# Patient Record
Sex: Male | Born: 1950
Health system: Southern US, Community
[De-identification: ages and names within clinical notes are randomized; demographics above are authoritative.]

## PROBLEM LIST (undated history)

## (undated) VITALS — BP 128/71 | HR 81 | Temp 97.0°F | Resp 16 | Ht 64.0 in | Wt 160.0 lb

## (undated) DIAGNOSIS — M109 Gout, unspecified: Secondary | ICD-10-CM

## (undated) DIAGNOSIS — N184 Chronic kidney disease, stage 4 (severe): Secondary | ICD-10-CM

## (undated) DIAGNOSIS — M94269 Chondromalacia, unspecified knee: Secondary | ICD-10-CM

## (undated) DIAGNOSIS — F1490 Cocaine use, unspecified, uncomplicated: Secondary | ICD-10-CM

## (undated) DIAGNOSIS — D649 Anemia, unspecified: Secondary | ICD-10-CM

## (undated) DIAGNOSIS — I2510 Atherosclerotic heart disease of native coronary artery without angina pectoris: Secondary | ICD-10-CM

## (undated) DIAGNOSIS — E785 Hyperlipidemia, unspecified: Secondary | ICD-10-CM

## (undated) DIAGNOSIS — F329 Major depressive disorder, single episode, unspecified: Secondary | ICD-10-CM

## (undated) DIAGNOSIS — B20 Human immunodeficiency virus [HIV] disease: Secondary | ICD-10-CM

## (undated) DIAGNOSIS — R51 Headache: Secondary | ICD-10-CM

## (undated) DIAGNOSIS — L0390 Cellulitis, unspecified: Secondary | ICD-10-CM

## (undated) DIAGNOSIS — M359 Systemic involvement of connective tissue, unspecified: Secondary | ICD-10-CM

## (undated) DIAGNOSIS — I10 Essential (primary) hypertension: Secondary | ICD-10-CM

## (undated) DIAGNOSIS — I4891 Unspecified atrial fibrillation: Secondary | ICD-10-CM

## (undated) DIAGNOSIS — J45909 Unspecified asthma, uncomplicated: Secondary | ICD-10-CM

## (undated) DIAGNOSIS — F32A Depression, unspecified: Secondary | ICD-10-CM

## (undated) DIAGNOSIS — I2699 Other pulmonary embolism without acute cor pulmonale: Secondary | ICD-10-CM

## (undated) HISTORY — DX: Depression, unspecified: F32.A

## (undated) HISTORY — DX: Hyperlipidemia, unspecified: E78.5

## (undated) HISTORY — DX: Unspecified asthma, uncomplicated: J45.909

## (undated) HISTORY — PX: BACK SURGERY: SHX140

## (undated) HISTORY — PX: SPINE SURGERY: SHX786

## (undated) HISTORY — DX: Atherosclerotic heart disease of native coronary artery without angina pectoris: I25.10

## (undated) HISTORY — PX: CARDIAC SURGERY: SHX584

## (undated) HISTORY — DX: Gout, unspecified: M10.9

## (undated) HISTORY — DX: Major depressive disorder, single episode, unspecified: F32.9

## (undated) HISTORY — DX: Chondromalacia, unspecified knee: M94.269

## (undated) HISTORY — PX: CORONARY ARTERY BYPASS GRAFT: SHX141

## (undated) HISTORY — PX: CERVICAL SPINE SURGERY: SHX589

## (undated) HISTORY — PX: CORONARY STENT PLACEMENT: SHX1402

## (undated) HISTORY — DX: Human immunodeficiency virus (HIV) disease: B20

## (undated) HISTORY — PX: BOWEL RESECTION: SHX1257

---

## 2000-10-17 DIAGNOSIS — E1140 Type 2 diabetes mellitus with diabetic neuropathy, unspecified: Secondary | ICD-10-CM | POA: Insufficient documentation

## 2000-10-17 DIAGNOSIS — IMO0002 Reserved for concepts with insufficient information to code with codable children: Secondary | ICD-10-CM | POA: Insufficient documentation

## 2008-11-28 ENCOUNTER — Emergency Department (HOSPITAL_COMMUNITY): Admission: EM | Admit: 2008-11-28 | Discharge: 2008-11-28 | Payer: Self-pay | Admitting: Emergency Medicine

## 2008-12-20 ENCOUNTER — Emergency Department (HOSPITAL_COMMUNITY): Admission: EM | Admit: 2008-12-20 | Discharge: 2008-12-20 | Payer: Self-pay | Admitting: Emergency Medicine

## 2009-01-13 ENCOUNTER — Emergency Department (HOSPITAL_COMMUNITY): Admission: EM | Admit: 2009-01-13 | Discharge: 2009-01-13 | Payer: Self-pay | Admitting: Emergency Medicine

## 2009-03-19 ENCOUNTER — Emergency Department (HOSPITAL_COMMUNITY): Admission: EM | Admit: 2009-03-19 | Discharge: 2009-03-19 | Payer: Self-pay | Admitting: Emergency Medicine

## 2009-06-11 ENCOUNTER — Emergency Department (HOSPITAL_COMMUNITY): Admission: EM | Admit: 2009-06-11 | Discharge: 2009-06-11 | Payer: Self-pay | Admitting: Emergency Medicine

## 2009-06-28 ENCOUNTER — Ambulatory Visit: Payer: Self-pay | Admitting: Internal Medicine

## 2009-06-28 ENCOUNTER — Inpatient Hospital Stay (HOSPITAL_COMMUNITY): Admission: EM | Admit: 2009-06-28 | Discharge: 2009-07-02 | Payer: Self-pay | Admitting: Emergency Medicine

## 2009-06-28 ENCOUNTER — Emergency Department (HOSPITAL_COMMUNITY): Admission: EM | Admit: 2009-06-28 | Discharge: 2009-06-28 | Payer: Self-pay | Admitting: Family Medicine

## 2009-06-30 ENCOUNTER — Encounter (INDEPENDENT_AMBULATORY_CARE_PROVIDER_SITE_OTHER): Payer: Self-pay | Admitting: Internal Medicine

## 2009-08-07 DEATH — deceased

## 2010-02-22 IMAGING — CR DG CHEST 2V
2 series · 2 of 2 positions shown · non-contrast
Comparison: None available.

CLINICAL DATA: Head cold.  Cough.

CHEST - 2 VIEW

[view not recorded (1 of 2)]
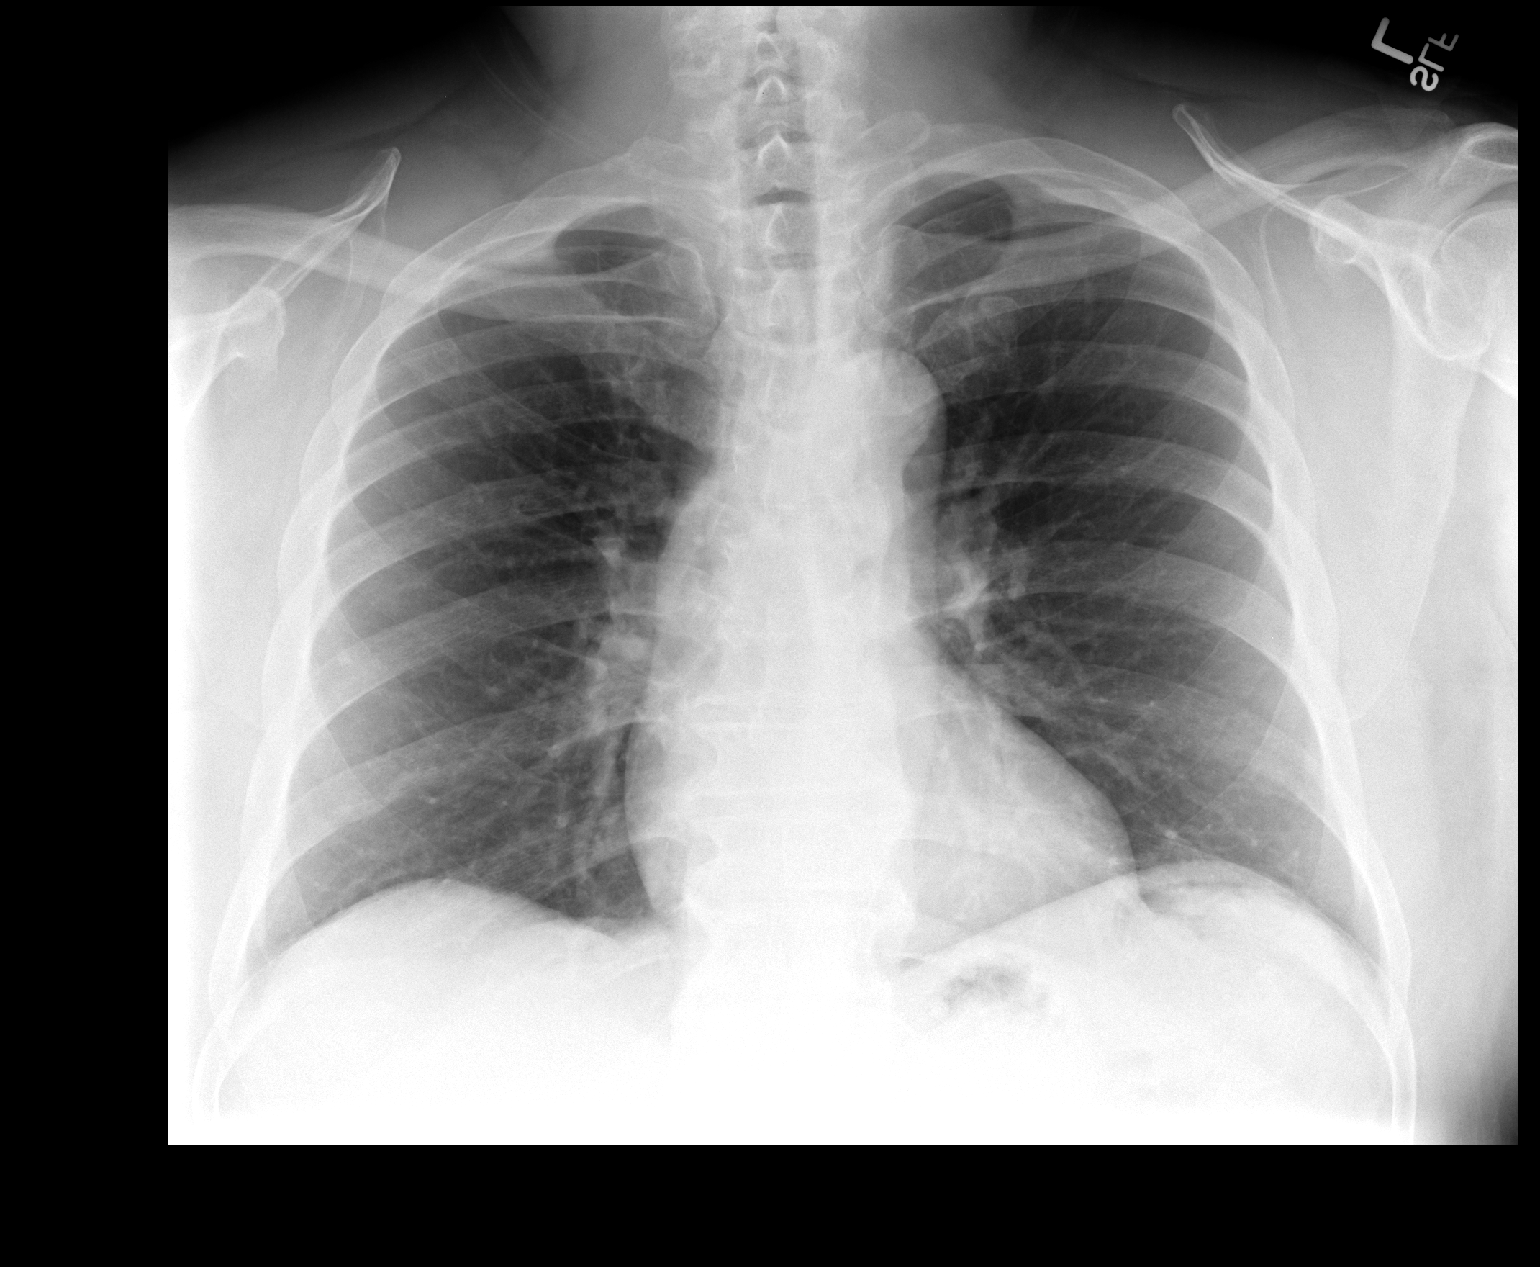

[view not recorded (2 of 2)]
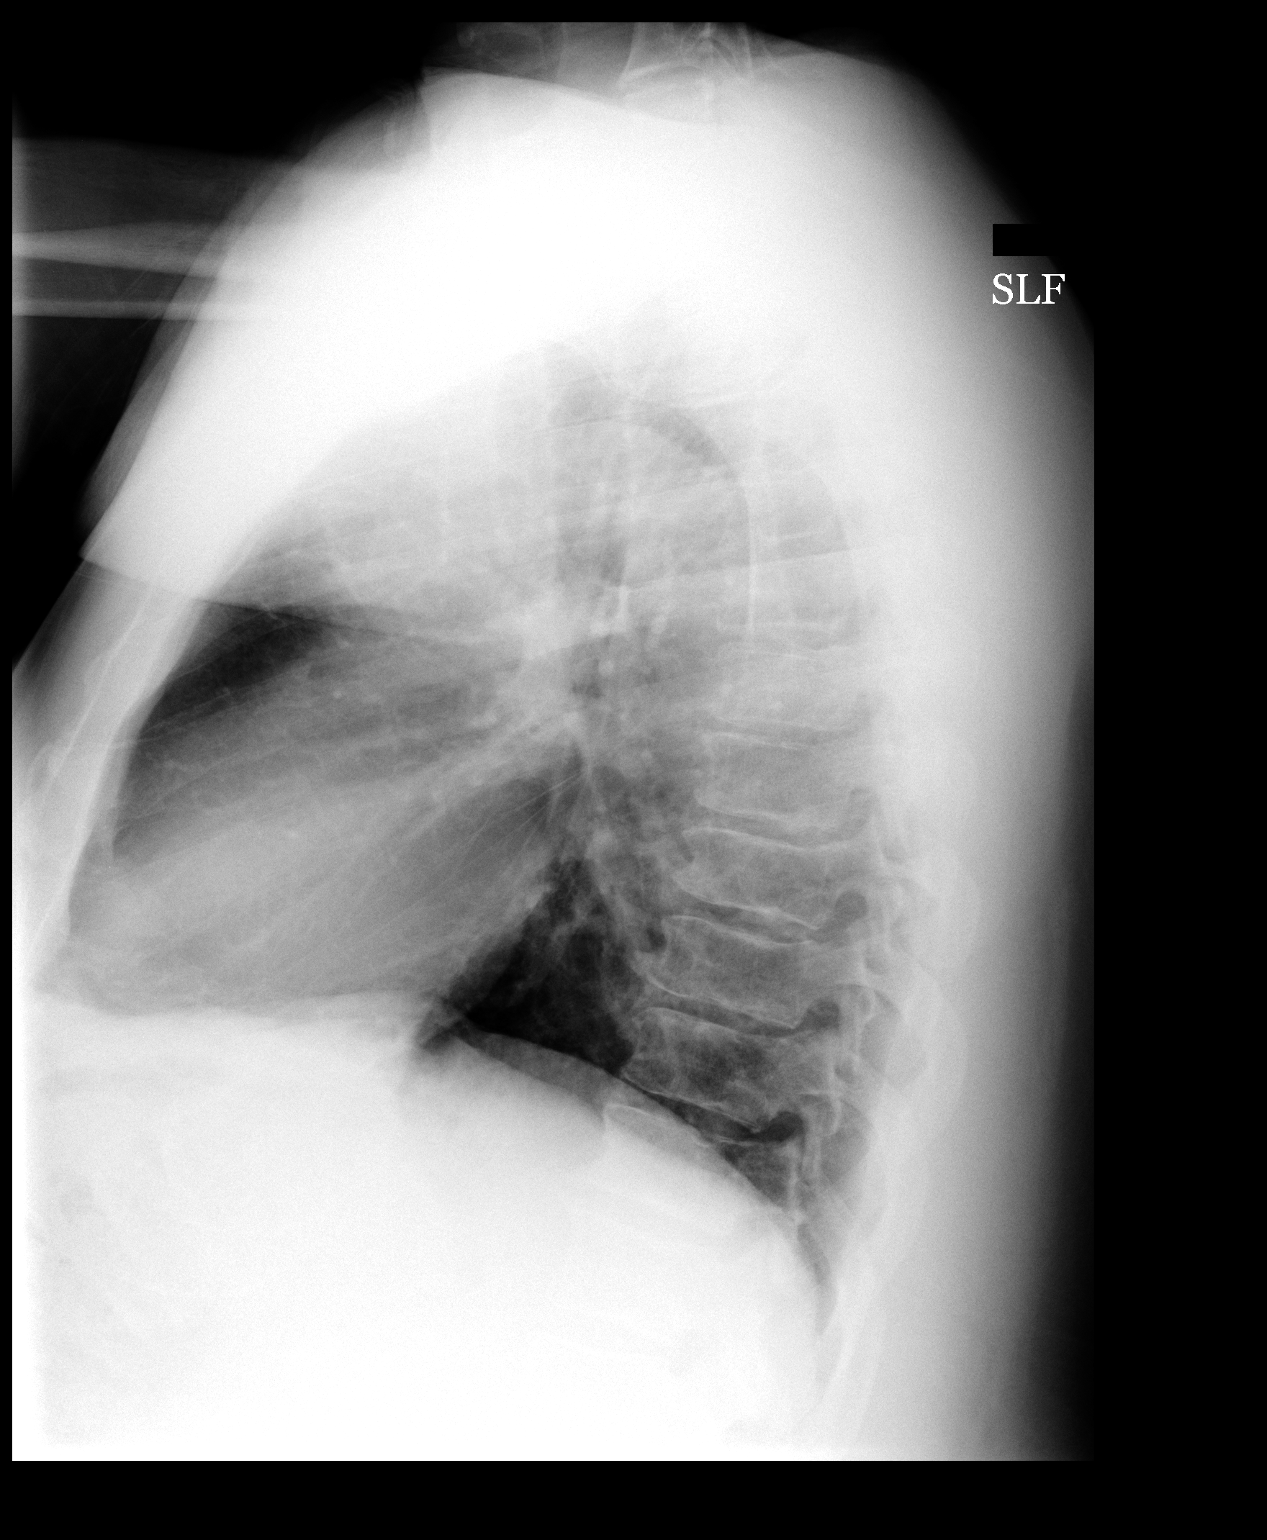

[2 of 2 positions shown; findings below may reference images not displayed]

FINDINGS: The cardiopericardial silhouette appears within normal
limits.  The left lung appears clear.  There is a 4 mm pulmonary
nodule present projected over the anterior right fifth rib.  This
is not definitively identified on the lateral view.  Follow-up
chest CT recommended to further assess.  This can be performed on a
non emergent basis.
IMPRESSION: 1.  4 mm right lung pulmonary nodule, follow-up chest CT
recommended.
2.  No acute cardiopulmonary disease.

## 2010-07-21 IMAGING — CR DG ABDOMEN 2V
2 series · 2 of 2 positions shown · non-contrast
Comparison: None

CLINICAL DATA: GI bleed

ABDOMEN - 2 VIEW

[w abdomen upright]
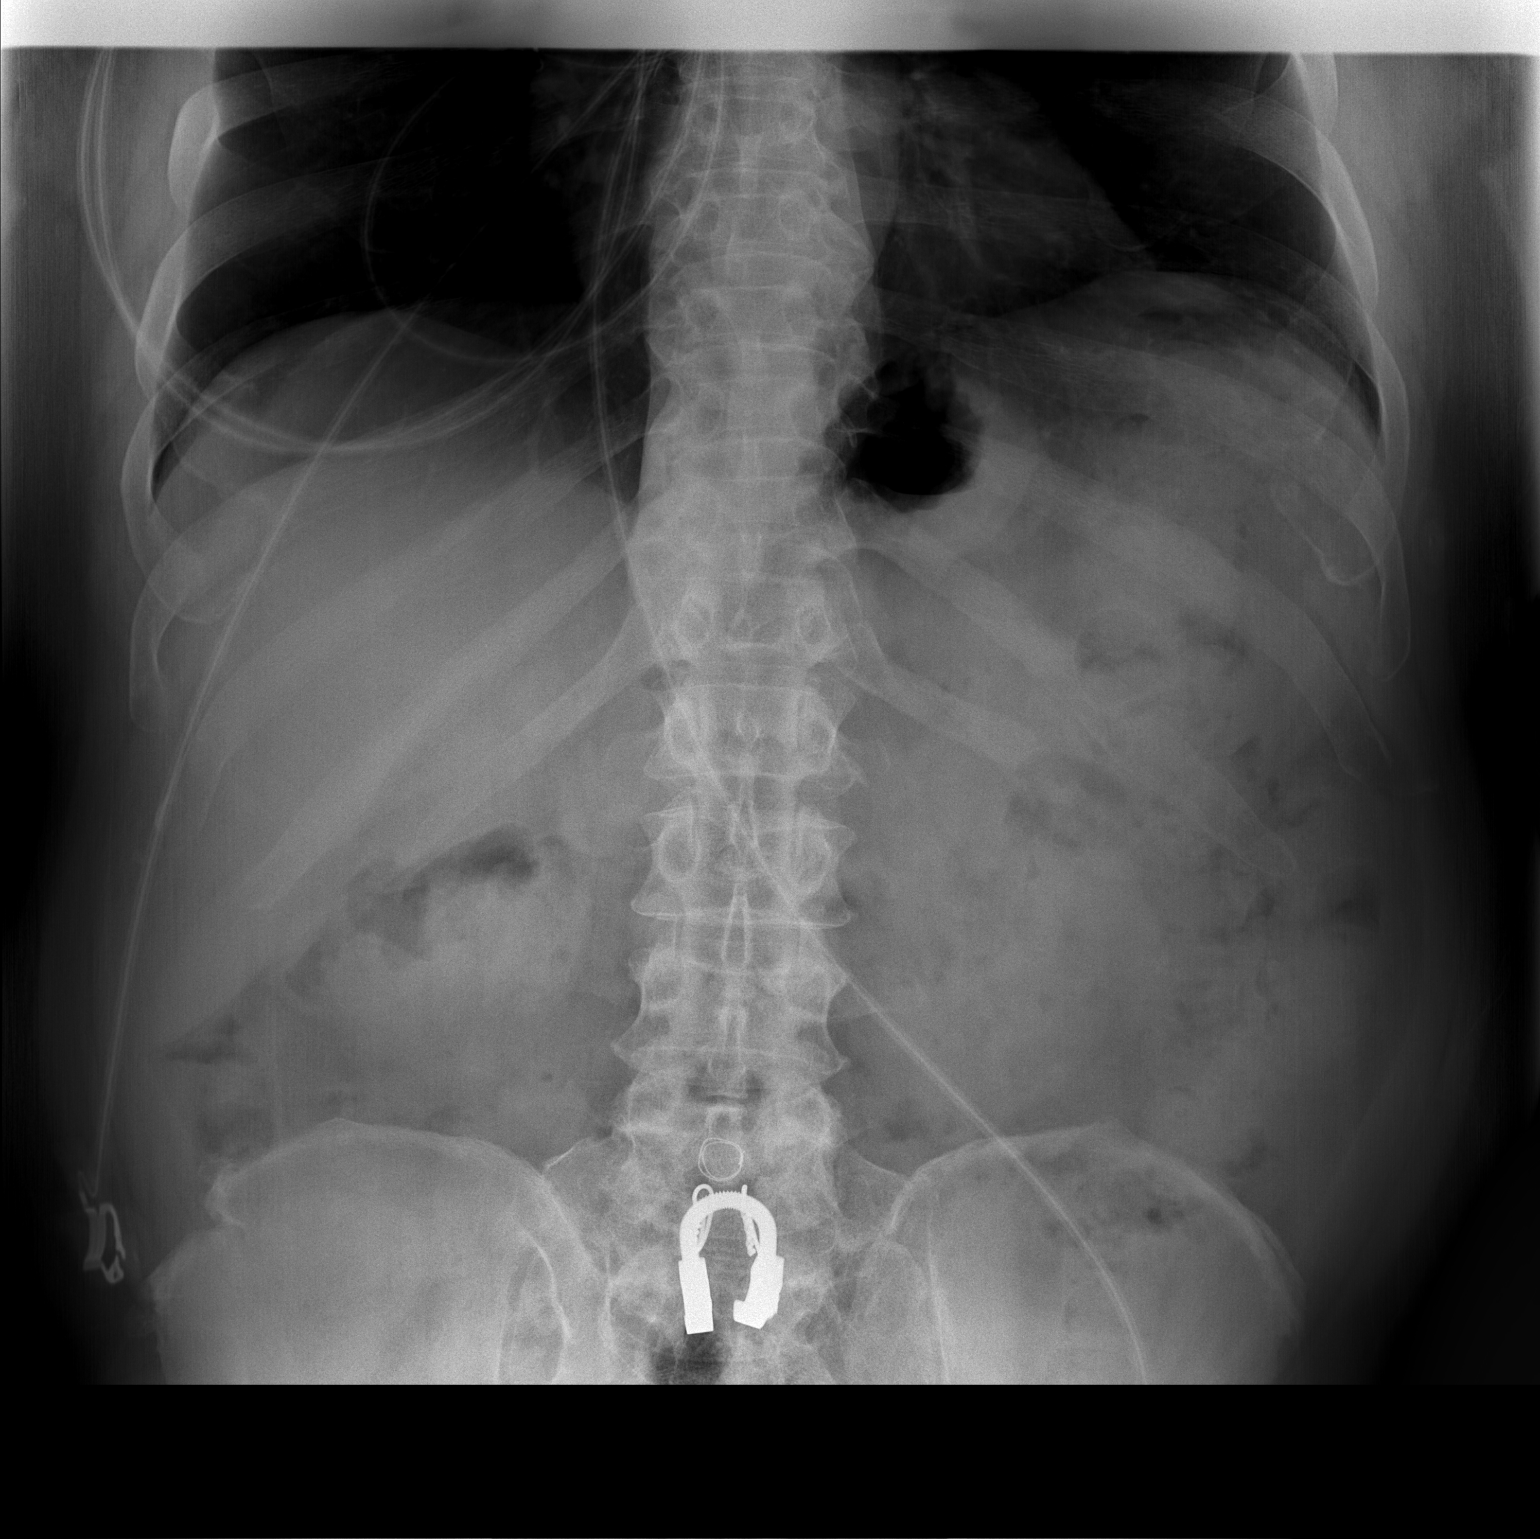

[t abdomen supine]
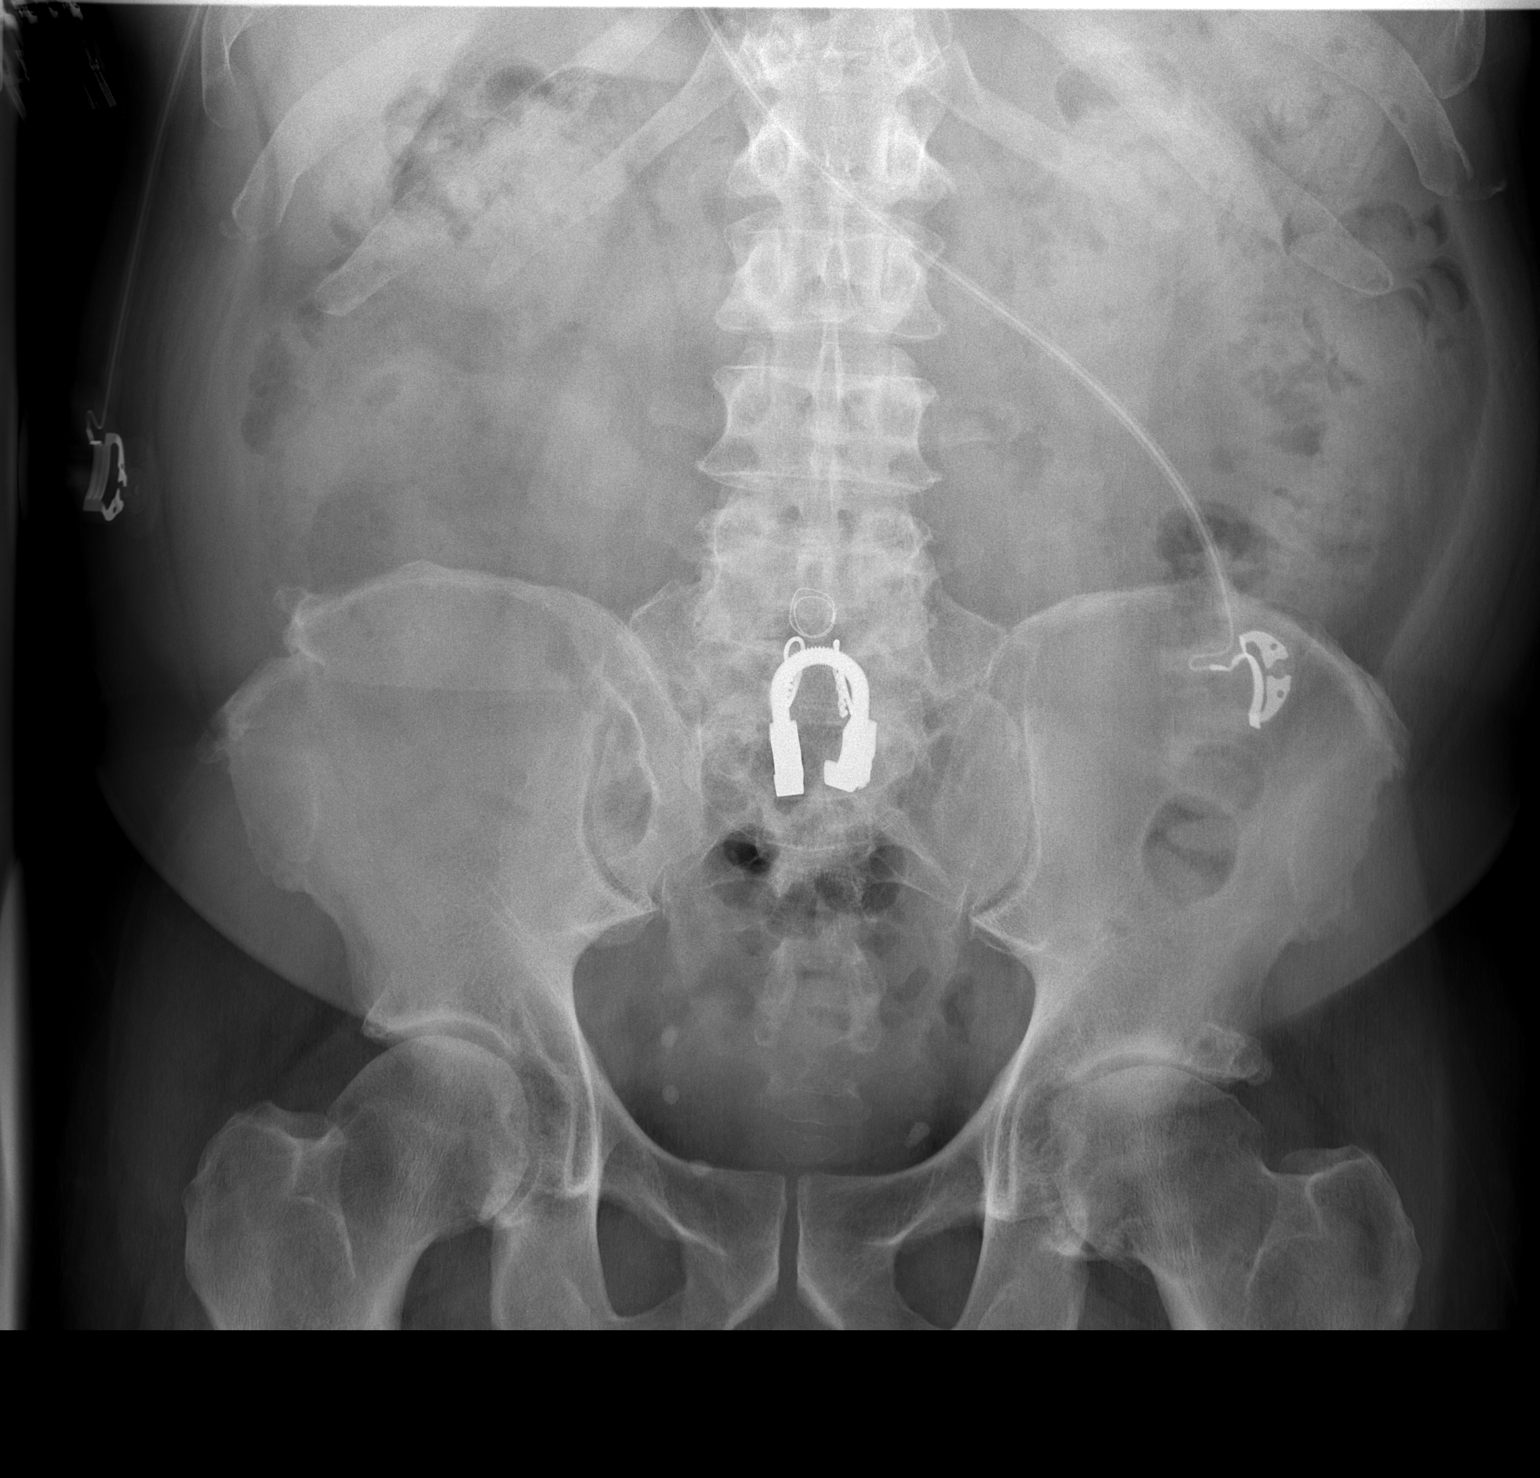

[2 of 2 positions shown; findings below may reference images not displayed]

FINDINGS: There is a nonspecific nonobstructive bowel gas pattern.
Metallic probable postsurgical material noted midline upper pelvis
sacral region.  No free abdominal air.
IMPRESSION: Nonspecific nonobstructive bowel gas pattern.  No free abdominal
air.

## 2010-08-07 IMAGING — CR DG CHEST 2V
2 series · 2 of 2 positions shown · non-contrast
Comparison: 01/13/2009

CLINICAL DATA: Shortness of breath

CHEST - 2 VIEW

[view not recorded (1 of 2)]
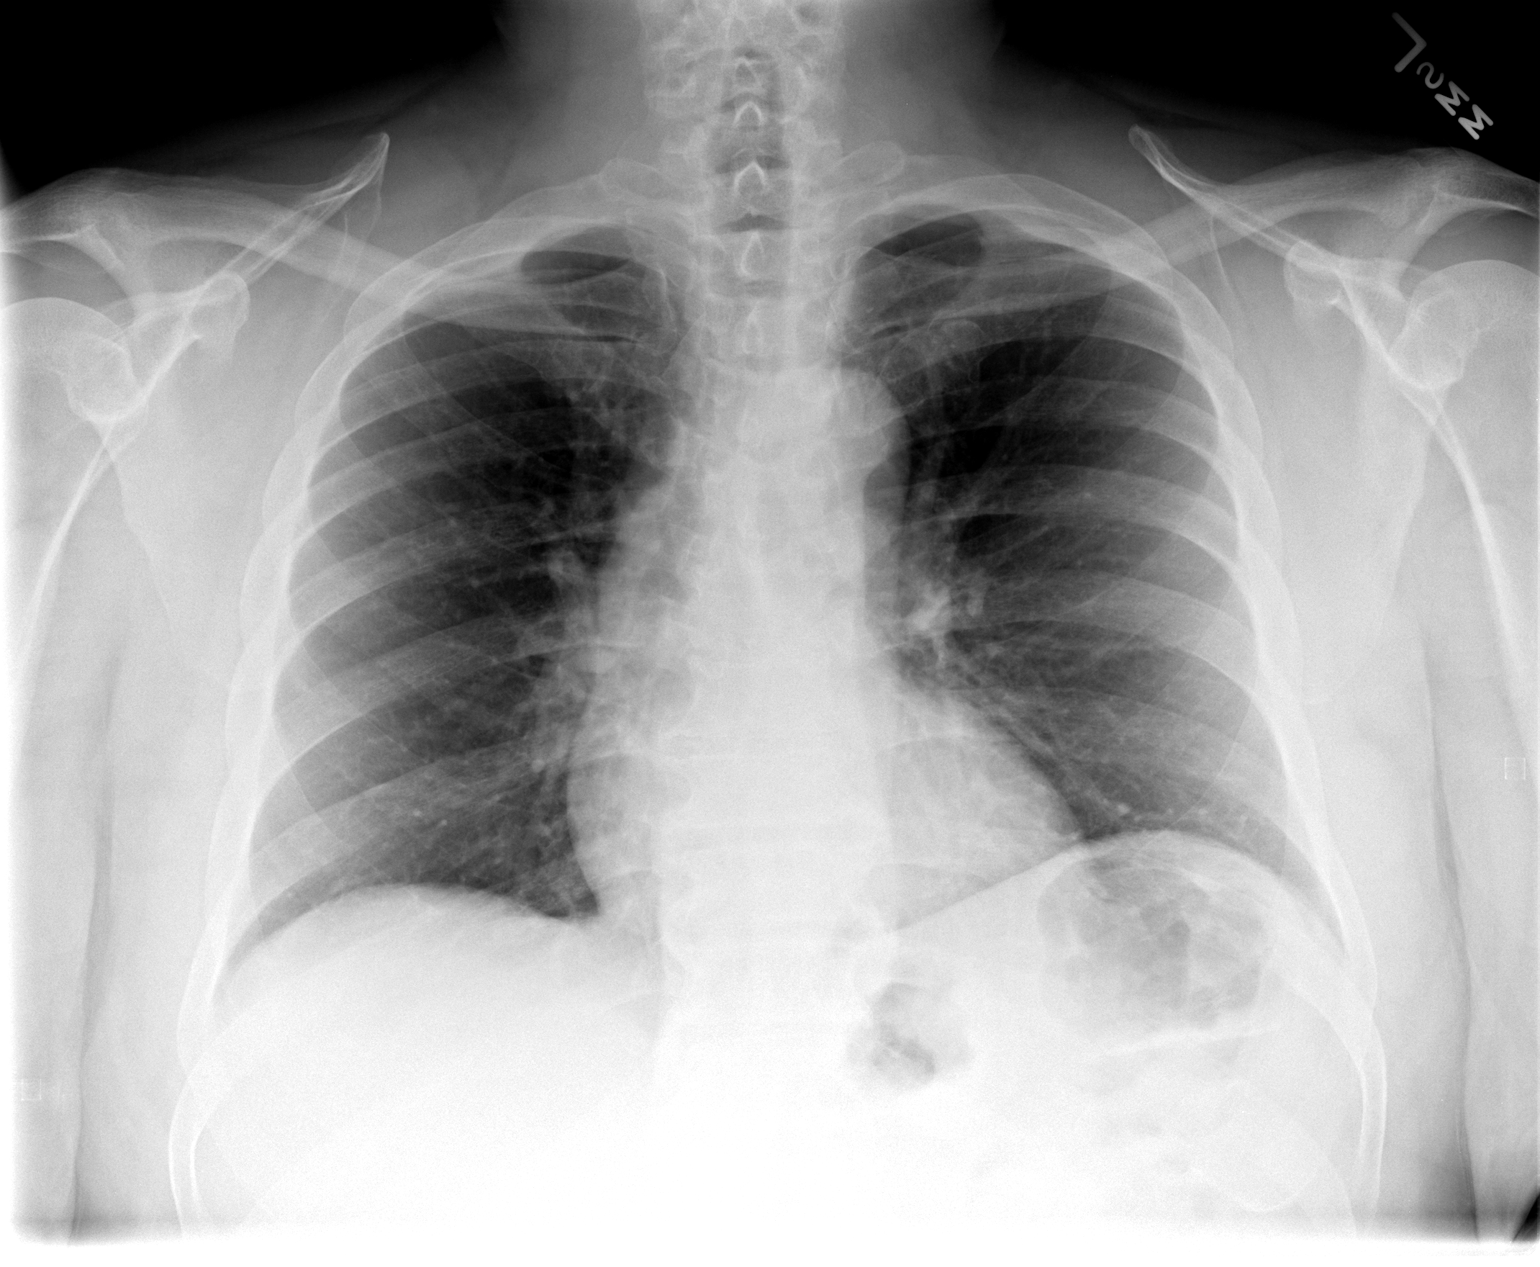

[view not recorded (2 of 2)]
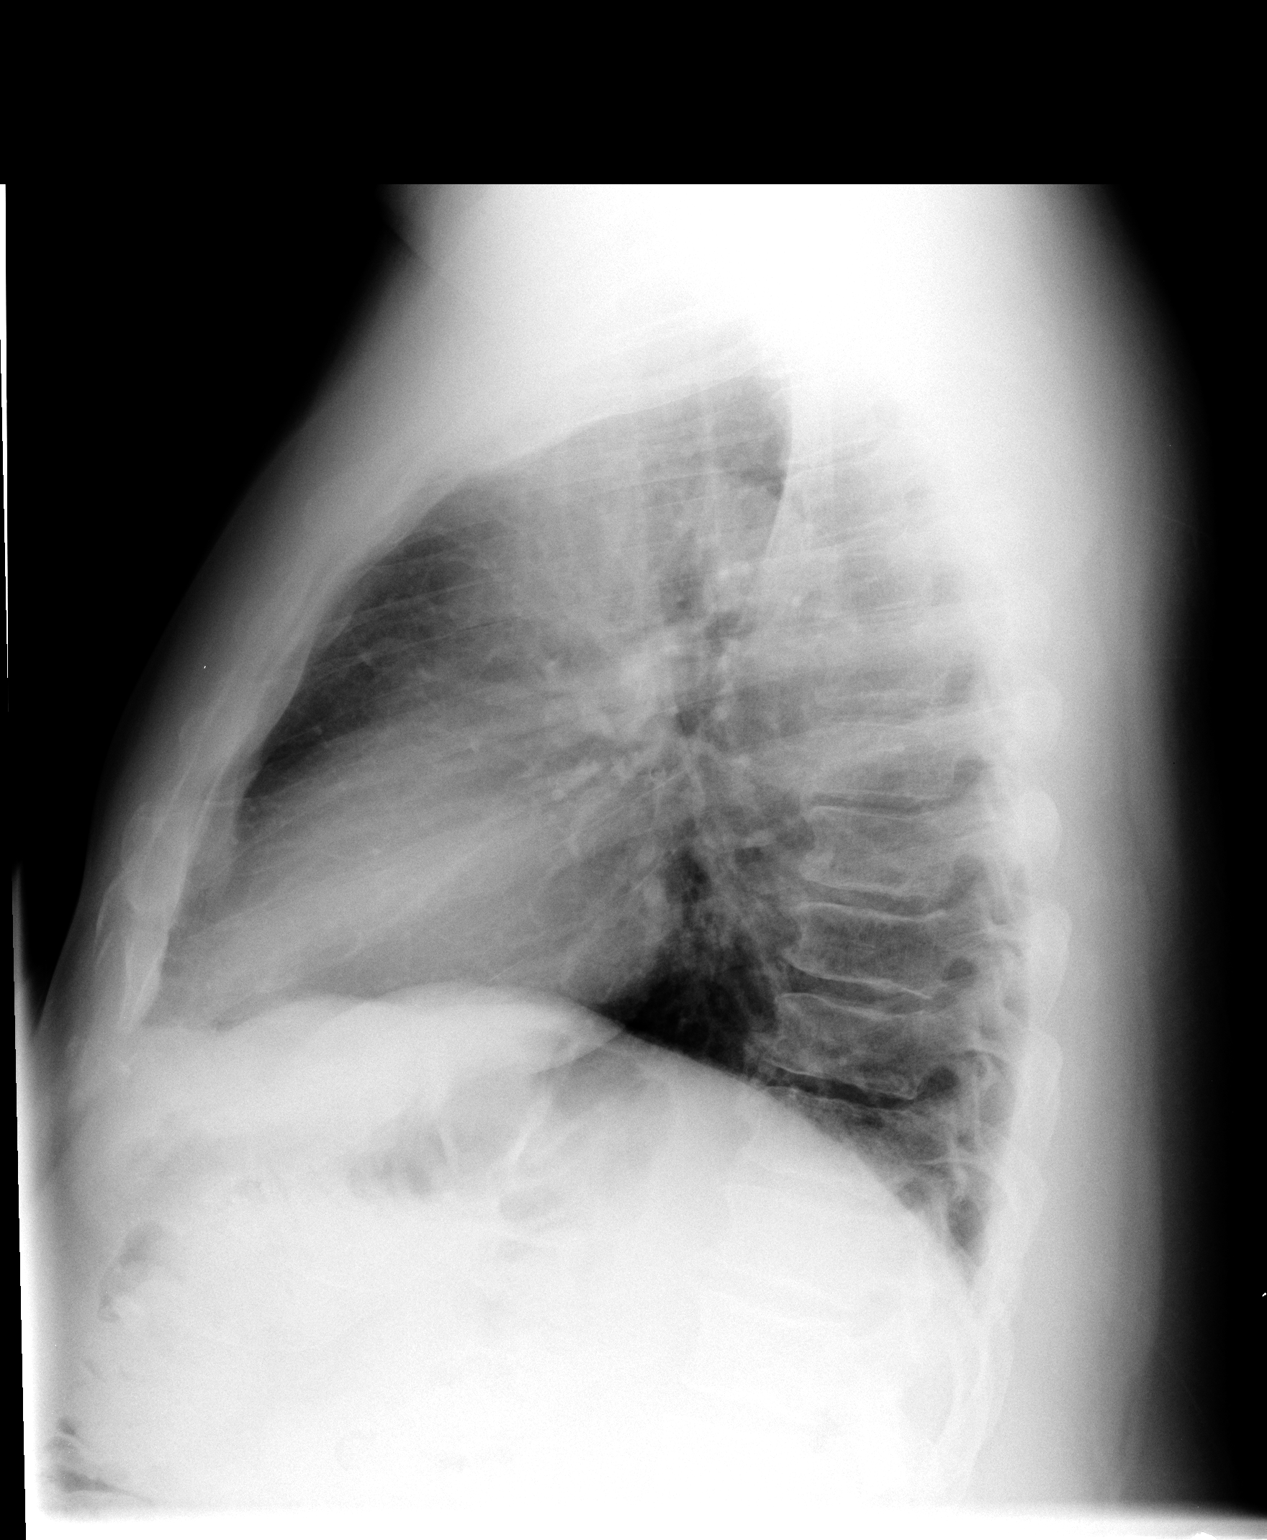

[2 of 2 positions shown; findings below may reference images not displayed]

FINDINGS: The heart size and mediastinal contours are within
normal limits.  Both lungs are clear.  The visualized skeletal
structures are unremarkable. 4 mm right mid lung zone nodule is
stable.
IMPRESSION: No active cardiopulmonary disease. 4 mm right mid lung zone nodule
is stable but follow-up is recommended in 6 months.

## 2010-08-08 IMAGING — CT CT ANGIO CHEST
2 of 6 series · 19 of 36 positions shown · IV contrast (APPLIED)
Comparison: None.

CLINICAL DATA: Shortness of breath. Chest pain.

CT ANGIOGRAPHY CHEST WITH CONTRAST
TECHNIQUE: Multidetector CT imaging of the chest was performed
using the standard protocol during bolus administration of
intravenous contrast. Multiplanar CT image reconstructions
including MIPs were obtained to evaluate the vascular anatomy.
Contrast: 100 ml [id number]-YII.

[Series 8: pulm embolism 1.0 b25f thins · axial · 0.66mm/px · z∈[-255,-14]mm · 18 of 269 slices shown]
[im 14/269  lung]
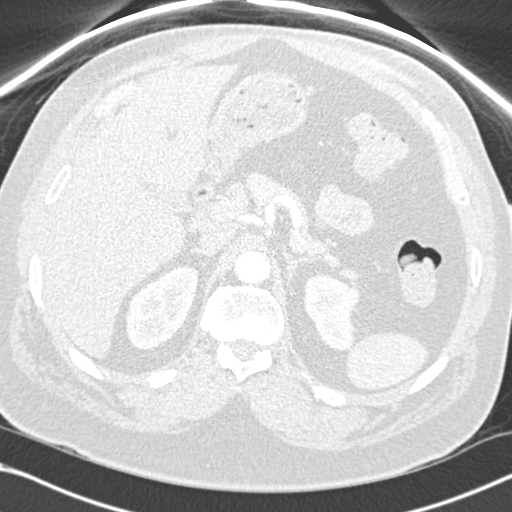
[im 27/269  mediastinal]
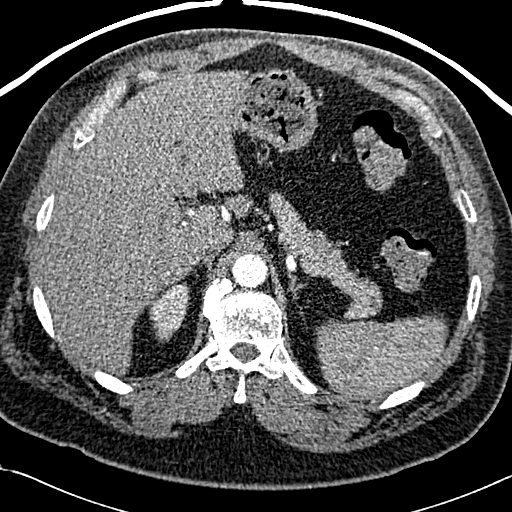
[im 41/269  lung]
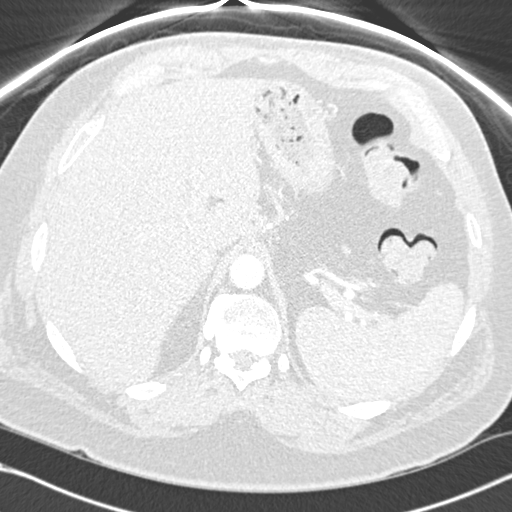
[im 54/269  mediastinal]
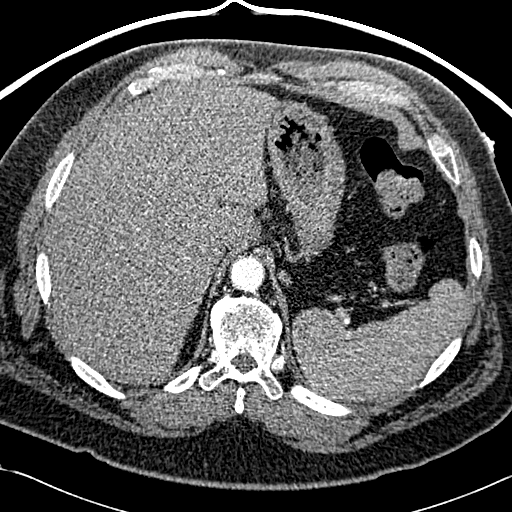
[im 68/269  lung]
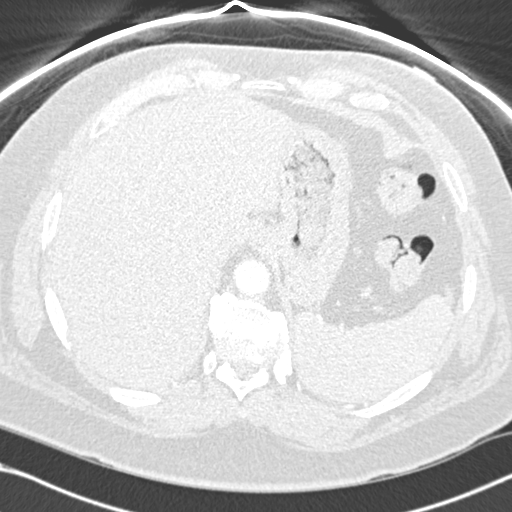
[im 81/269  mediastinal]
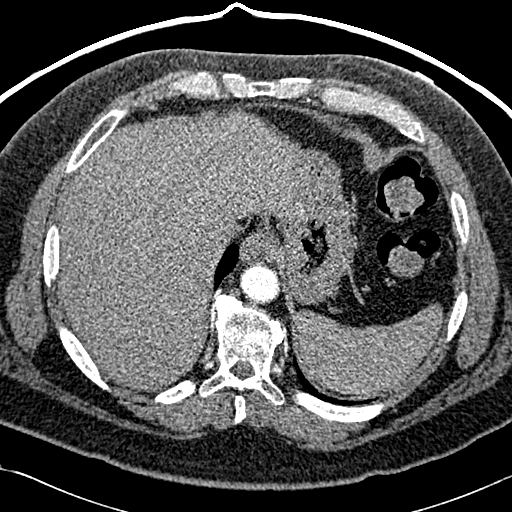
[im 94/269  lung]
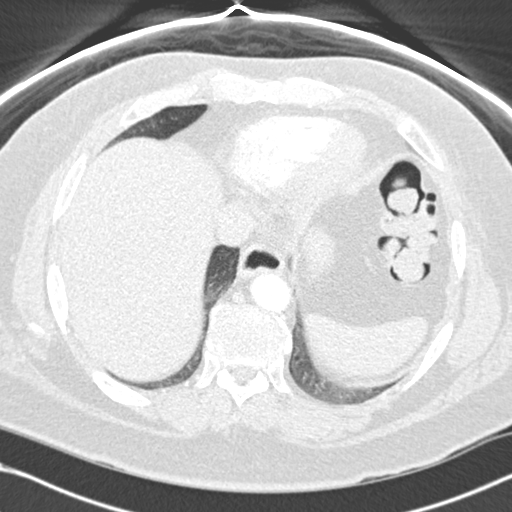
[im 108/269  mediastinal]
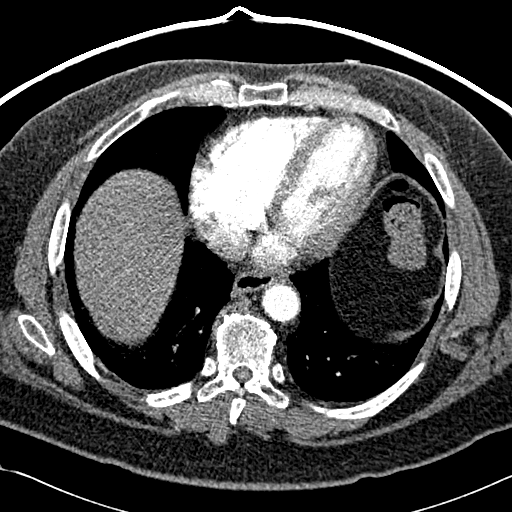
[im 121/269  lung]
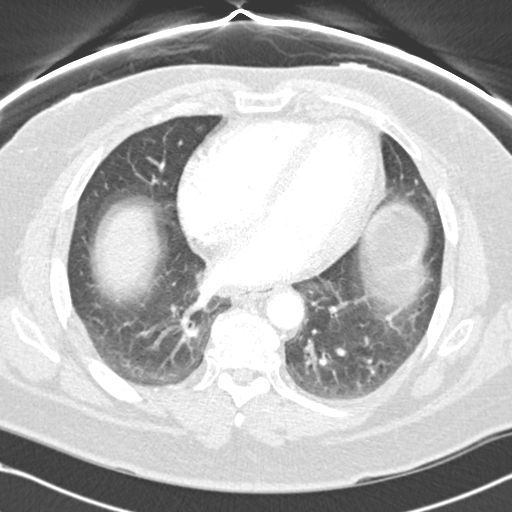
[im 148/269  mediastinal]
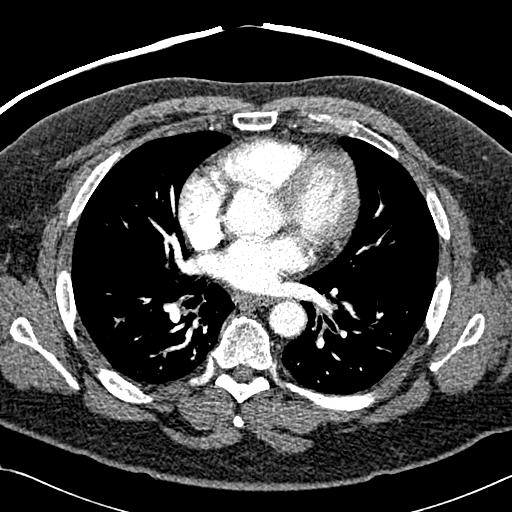
[im 161/269  lung]
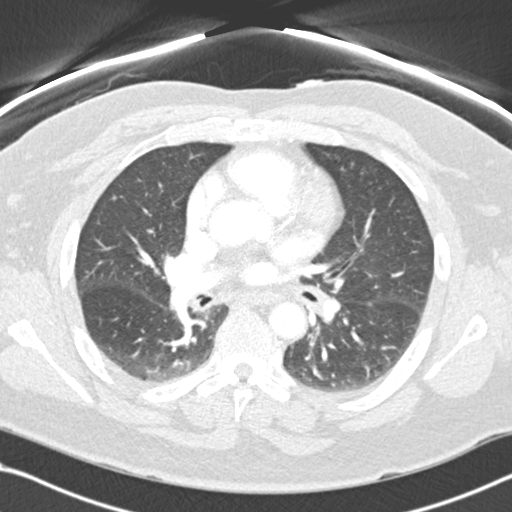
[im 175/269  mediastinal]
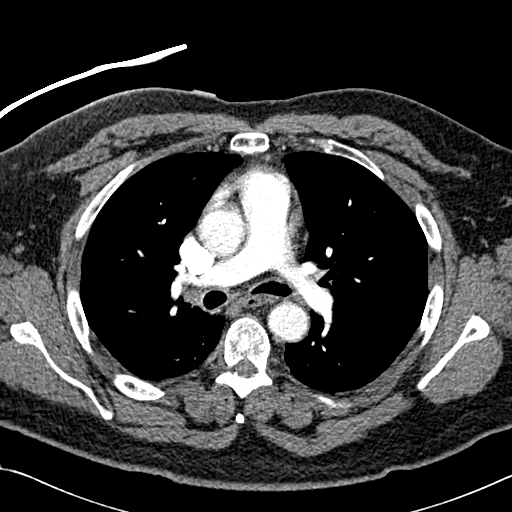
[im 188/269  lung]
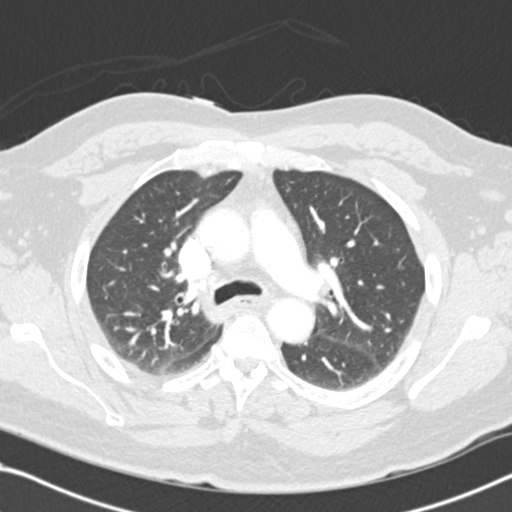
[im 202/269  mediastinal]
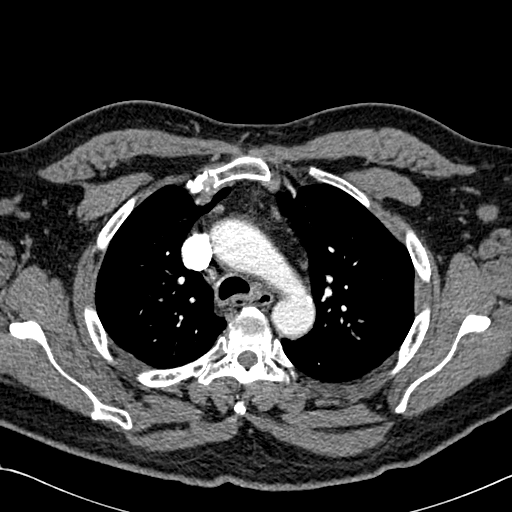
[im 215/269  lung]
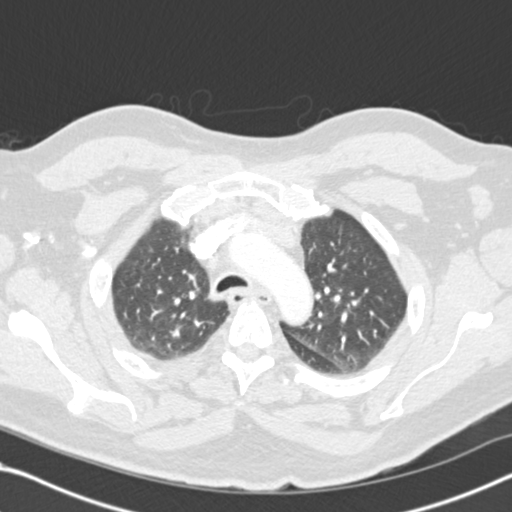
[im 228/269  mediastinal]
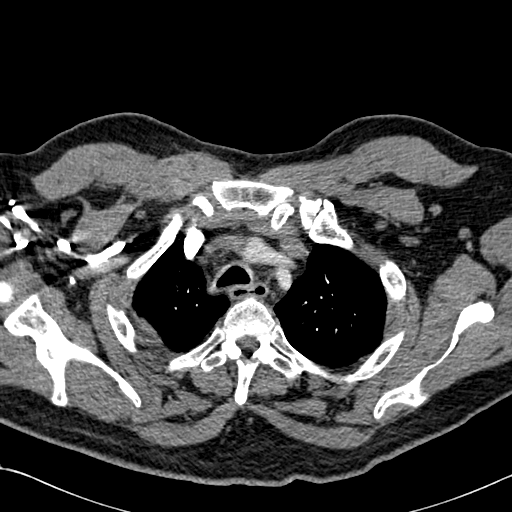
[im 242/269  lung]
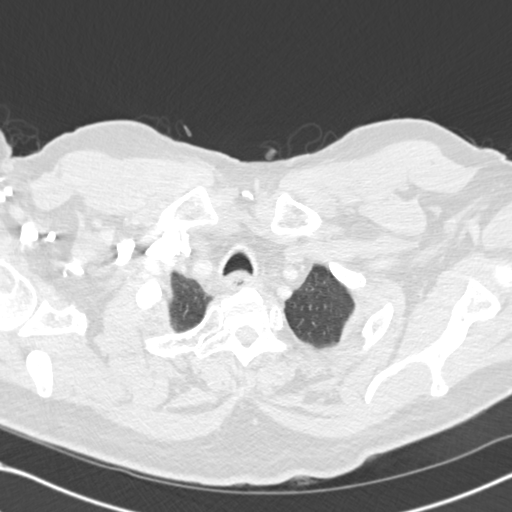
[im 255/269  mediastinal]
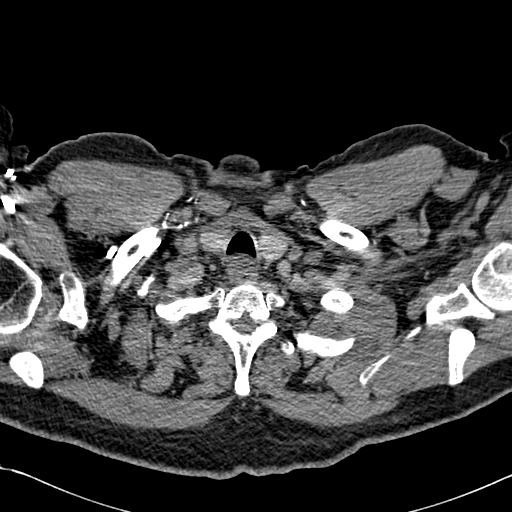

[Series 9: pulm embolism 2.0 cor · coronal · 0.66mm/px · 1 of 110 slices shown]
[im 55/110  mediastinal]
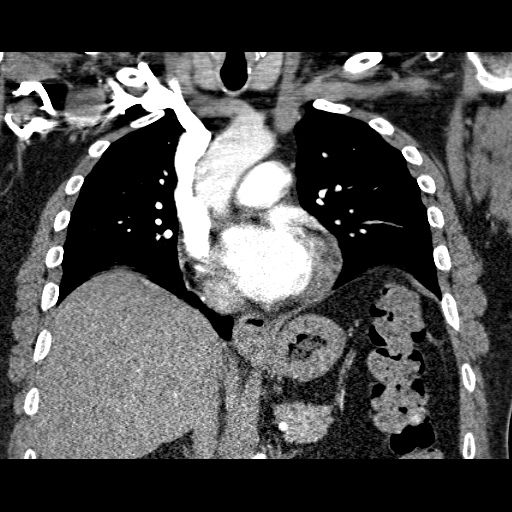

[19 of 36 positions shown; findings below may reference images not displayed]

FINDINGS: No pulmonary embolus or aortic dissection.  Coronary
artery calcifications.  Small hiatal hernia.

Left supraclavicular slightly prominent size 1.4 x 1 cm lymph node
(series 7 image five).  Posterior right hilar adenopathy measures
1.7 x 1.5 cm (series 7 image 32).  Etiology indeterminate.

Minimal dependent atelectatic changes without focal consolidation.
3.3 mm nodular density right upper lobe/right middle lobe junction
(series 5 image 33).  If the patient is at high risk for
bronchogenic carcinoma, follow-up chest CT at 1 year is
recommended.  If the patient is at low risk, no follow-up is
needed.  This recommendation follows the consensus statement:
"Guidelines for Management of Small Pulmonary Nodules Detected on
CT Scans:  A Statement from the [HOSPITAL]" as published in
[URL]

Fatty liver.  Visualized portions of the upper abdominal structures
without focal abnormality.

Mild degenerative changes throughout the thoracic spine with
Schmorl's node deformities but without bony destructive lesion.

Review of the MIP images confirms the above findings.
IMPRESSION: No pulmonary embolus or evidence of aortic dissection.

Coronary artery calcifications.

Left supraclavicular and right hilar adenopathy.  Etiology
indeterminate.

Tiny nodule right lung as noted above.

## 2011-01-11 LAB — BRAIN NATRIURETIC PEPTIDE
Pro B Natriuretic peptide (BNP): 181 pg/mL — ABNORMAL HIGH (ref 0.0–100.0)
Pro B Natriuretic peptide (BNP): 73 pg/mL (ref 0.0–100.0)

## 2011-01-11 LAB — CBC
HCT: 39 % (ref 39.0–52.0)
Hemoglobin: 12.6 g/dL — ABNORMAL LOW (ref 13.0–17.0)
MCHC: 33.5 g/dL (ref 30.0–36.0)
MCV: 83.4 fL (ref 78.0–100.0)
MCV: 82.6 fL (ref 78.0–100.0)
Platelets: 164 10*3/uL (ref 150–400)
RBC: 4.51 MIL/uL (ref 4.22–5.81)
RBC: 4.73 MIL/uL (ref 4.22–5.81)
RDW: 15.1 % (ref 11.5–15.5)
RDW: 15.2 % (ref 11.5–15.5)
WBC: 4.1 10*3/uL (ref 4.0–10.5)

## 2011-01-11 LAB — POCT I-STAT 3, ART BLOOD GAS (G3+)
O2 Saturation: 98 %
Patient temperature: 98.6
pCO2 arterial: 31.2 mmHg — ABNORMAL LOW (ref 35.0–45.0)

## 2011-01-11 LAB — DIFFERENTIAL
Basophils Absolute: 0 10*3/uL (ref 0.0–0.1)
Basophils Absolute: 0 10*3/uL (ref 0.0–0.1)
Basophils Relative: 0 % (ref 0–1)
Eosinophils Relative: 4 % (ref 0–5)
Lymphocytes Relative: 27 % (ref 12–46)
Monocytes Absolute: 0.5 10*3/uL (ref 0.1–1.0)
Monocytes Absolute: 0.4 10*3/uL (ref 0.1–1.0)
Monocytes Relative: 11 % (ref 3–12)
Neutro Abs: 2.8 10*3/uL (ref 1.7–7.7)

## 2011-01-11 LAB — GLUCOSE, CAPILLARY
Glucose-Capillary: 105 mg/dL — ABNORMAL HIGH (ref 70–99)
Glucose-Capillary: 147 mg/dL — ABNORMAL HIGH (ref 70–99)
Glucose-Capillary: 157 mg/dL — ABNORMAL HIGH (ref 70–99)
Glucose-Capillary: 173 mg/dL — ABNORMAL HIGH (ref 70–99)
Glucose-Capillary: 246 mg/dL — ABNORMAL HIGH (ref 70–99)
Glucose-Capillary: 271 mg/dL — ABNORMAL HIGH (ref 70–99)
Glucose-Capillary: 274 mg/dL — ABNORMAL HIGH (ref 70–99)
Glucose-Capillary: 289 mg/dL — ABNORMAL HIGH (ref 70–99)
Glucose-Capillary: 324 mg/dL — ABNORMAL HIGH (ref 70–99)
Glucose-Capillary: 365 mg/dL — ABNORMAL HIGH (ref 70–99)

## 2011-01-11 LAB — COMPREHENSIVE METABOLIC PANEL
AST: 38 U/L — ABNORMAL HIGH (ref 0–37)
Albumin: 4 g/dL (ref 3.5–5.2)
Alkaline Phosphatase: 63 U/L (ref 39–117)
BUN: 14 mg/dL (ref 6–23)
CO2: 27 mEq/L (ref 19–32)
Chloride: 103 mEq/L (ref 96–112)
Creatinine, Ser: 0.89 mg/dL (ref 0.4–1.5)
GFR calc Af Amer: >60 mL/min (ref >60)
GFR calc non Af Amer: >60 mL/min (ref >60)
Potassium: 4.4 mEq/L (ref 3.5–5.1)
Total Bilirubin: 0.8 mg/dL (ref 0.3–1.2)

## 2011-01-11 LAB — BASIC METABOLIC PANEL
BUN: 21 mg/dL (ref 6–23)
CO2: 23 mEq/L (ref 19–32)
Calcium: 8.3 mg/dL — ABNORMAL LOW (ref 8.4–10.5)
Calcium: 8.9 mg/dL (ref 8.4–10.5)
Chloride: 108 mEq/L (ref 96–112)
Creatinine, Ser: 1.02 mg/dL (ref 0.4–1.5)
GFR calc Af Amer: >60 mL/min (ref >60)
GFR calc non Af Amer: >60 mL/min (ref >60)
Glucose, Bld: 253 mg/dL — ABNORMAL HIGH (ref 70–99)
Sodium: 141 mEq/L (ref 135–145)

## 2011-01-11 LAB — LIPID PANEL
Cholesterol: 147 mg/dL (ref 0–200)
HDL: 40 mg/dL (ref >39)

## 2011-01-11 LAB — CK TOTAL AND CKMB (NOT AT ARMC): Total CK: 113 U/L (ref 7–232)

## 2011-01-11 LAB — POCT CARDIAC MARKERS
CKMB, poc: 1.4 ng/mL (ref 1.0–8.0)
Troponin i, poc: <0.05 ng/mL (ref 0.00–0.09)

## 2011-01-11 LAB — HOMOCYSTEINE: Homocysteine: 12.3 umol/L (ref 4.0–15.4)

## 2011-01-11 LAB — HEMOGLOBIN A1C: Mean Plasma Glucose: 194 mg/dL

## 2011-01-11 LAB — TSH: TSH: 3.607 u[IU]/mL (ref 0.350–4.500)

## 2011-01-11 LAB — TROPONIN I: Troponin I: 0.02 ng/mL (ref 0.00–0.06)

## 2011-01-11 LAB — PROTIME-INR: Prothrombin Time: 11.3 seconds — ABNORMAL LOW (ref 11.6–15.2)

## 2011-01-11 LAB — CORTISOL: Cortisol, Plasma: 0.7 ug/dL

## 2011-01-22 LAB — GLUCOSE, CAPILLARY: Glucose-Capillary: 337 mg/dL — ABNORMAL HIGH (ref 70–99)

## 2011-04-07 ENCOUNTER — Emergency Department (HOSPITAL_BASED_OUTPATIENT_CLINIC_OR_DEPARTMENT_OTHER)
Admission: EM | Admit: 2011-04-07 | Discharge: 2011-04-07 | Disposition: A | Discharge status: Home / Self Care | Payer: Medicare Other | Attending: Emergency Medicine | Admitting: Emergency Medicine

## 2011-04-07 DIAGNOSIS — E119 Type 2 diabetes mellitus without complications: Secondary | ICD-10-CM | POA: Insufficient documentation

## 2011-04-07 DIAGNOSIS — E7800 Pure hypercholesterolemia, unspecified: Secondary | ICD-10-CM | POA: Insufficient documentation

## 2011-04-07 DIAGNOSIS — Z79899 Other long term (current) drug therapy: Secondary | ICD-10-CM | POA: Insufficient documentation

## 2011-04-07 DIAGNOSIS — M109 Gout, unspecified: Secondary | ICD-10-CM | POA: Insufficient documentation

## 2011-04-07 DIAGNOSIS — I10 Essential (primary) hypertension: Secondary | ICD-10-CM | POA: Insufficient documentation

## 2011-08-08 DIAGNOSIS — B20 Human immunodeficiency virus [HIV] disease: Secondary | ICD-10-CM

## 2011-08-08 DIAGNOSIS — Z21 Asymptomatic human immunodeficiency virus [HIV] infection status: Secondary | ICD-10-CM

## 2011-08-08 HISTORY — DX: Asymptomatic human immunodeficiency virus (hiv) infection status: Z21

## 2011-08-08 HISTORY — DX: Human immunodeficiency virus (HIV) disease: B20

## 2011-08-16 ENCOUNTER — Emergency Department (INDEPENDENT_AMBULATORY_CARE_PROVIDER_SITE_OTHER): Payer: Medicare Other

## 2011-08-16 ENCOUNTER — Encounter: Payer: Self-pay | Admitting: *Deleted

## 2011-08-16 ENCOUNTER — Other Ambulatory Visit: Payer: Self-pay

## 2011-08-16 ENCOUNTER — Emergency Department (HOSPITAL_BASED_OUTPATIENT_CLINIC_OR_DEPARTMENT_OTHER)
Admission: EM | Admit: 2011-08-16 | Discharge: 2011-08-16 | Disposition: A | Discharge status: Home / Self Care | Payer: Medicare Other | Attending: Emergency Medicine | Admitting: Emergency Medicine

## 2011-08-16 DIAGNOSIS — H02409 Unspecified ptosis of unspecified eyelid: Secondary | ICD-10-CM

## 2011-08-16 DIAGNOSIS — I10 Essential (primary) hypertension: Secondary | ICD-10-CM | POA: Insufficient documentation

## 2011-08-16 DIAGNOSIS — E119 Type 2 diabetes mellitus without complications: Secondary | ICD-10-CM | POA: Insufficient documentation

## 2011-08-16 DIAGNOSIS — R079 Chest pain, unspecified: Secondary | ICD-10-CM | POA: Insufficient documentation

## 2011-08-16 DIAGNOSIS — Q674 Other congenital deformities of skull, face and jaw: Secondary | ICD-10-CM | POA: Insufficient documentation

## 2011-08-16 DIAGNOSIS — H539 Unspecified visual disturbance: Secondary | ICD-10-CM

## 2011-08-16 DIAGNOSIS — M549 Dorsalgia, unspecified: Secondary | ICD-10-CM | POA: Insufficient documentation

## 2011-08-16 DIAGNOSIS — F1410 Cocaine abuse, uncomplicated: Secondary | ICD-10-CM | POA: Insufficient documentation

## 2011-08-16 DIAGNOSIS — R45851 Suicidal ideations: Secondary | ICD-10-CM | POA: Insufficient documentation

## 2011-08-16 DIAGNOSIS — H538 Other visual disturbances: Secondary | ICD-10-CM | POA: Insufficient documentation

## 2011-08-16 HISTORY — DX: Essential (primary) hypertension: I10

## 2011-08-16 LAB — GLUCOSE, CAPILLARY: Glucose-Capillary: 296 mg/dL — ABNORMAL HIGH (ref 70–99)

## 2011-08-16 LAB — COMPREHENSIVE METABOLIC PANEL
ALT: 16 U/L (ref 0–53)
AST: 17 U/L (ref 0–37)
Alkaline Phosphatase: 70 U/L (ref 39–117)
CO2: 25 mEq/L (ref 19–32)
GFR calc Af Amer: >90 mL/min (ref >90)
GFR calc non Af Amer: >90 mL/min (ref >90)
Glucose, Bld: 367 mg/dL — ABNORMAL HIGH (ref 70–99)
Potassium: 2.8 mEq/L — ABNORMAL LOW (ref 3.5–5.1)
Sodium: 140 mEq/L (ref 135–145)
Total Protein: 8.5 g/dL — ABNORMAL HIGH (ref 6.0–8.3)

## 2011-08-16 LAB — RAPID URINE DRUG SCREEN, HOSP PERFORMED
Barbiturates: NOT DETECTED
Benzodiazepines: NOT DETECTED
Cocaine: POSITIVE — AB
Tetrahydrocannabinol: NOT DETECTED

## 2011-08-16 LAB — URINE MICROSCOPIC-ADD ON

## 2011-08-16 LAB — URINALYSIS, ROUTINE W REFLEX MICROSCOPIC
Bilirubin Urine: NEGATIVE
Ketones, ur: 15 mg/dL — AB
Leukocytes, UA: NEGATIVE
Nitrite: NEGATIVE
Protein, ur: 30 mg/dL — AB
Urobilinogen, UA: 0.2 mg/dL (ref 0.0–1.0)
pH: 6 (ref 5.0–8.0)

## 2011-08-16 LAB — CBC
Platelets: 199 10*3/uL (ref 150–400)
RBC: 4.81 MIL/uL (ref 4.22–5.81)
RDW: 13.4 % (ref 11.5–15.5)
WBC: 4.1 10*3/uL (ref 4.0–10.5)

## 2011-08-16 LAB — DIFFERENTIAL
Basophils Absolute: 0 10*3/uL (ref 0.0–0.1)
Lymphocytes Relative: 22 % (ref 12–46)
Lymphs Abs: 0.9 10*3/uL (ref 0.7–4.0)
Neutrophils Relative %: 68 % (ref 43–77)

## 2011-08-16 LAB — ETHANOL: Alcohol, Ethyl (B): <11 mg/dL (ref 0–11)

## 2011-08-16 MED ORDER — MORPHINE SULFATE 4 MG/ML IJ SOLN
INTRAMUSCULAR | Status: AC
  Filled 2011-08-16: qty 1

## 2011-08-16 MED ORDER — MORPHINE SULFATE 4 MG/ML IJ SOLN
4.0000 mg | Freq: Once | INTRAMUSCULAR | Status: AC
  Administered 2011-08-16: 4 mg via INTRAVENOUS

## 2011-08-16 MED ORDER — INSULIN ASPART 100 UNIT/ML ~~LOC~~ SOLN
5.0000 [IU] | Freq: Once | SUBCUTANEOUS | Status: AC
  Administered 2011-08-16: 5 [IU] via SUBCUTANEOUS
  Filled 2011-08-16: qty 3

## 2011-08-16 MED ORDER — INSULIN REGULAR HUMAN 100 UNIT/ML IJ SOLN
INTRAMUSCULAR | Status: AC
  Filled 2011-08-16: qty 3

## 2011-08-16 MED ORDER — LORAZEPAM 2 MG/ML IJ SOLN
1.0000 mg | Freq: Once | INTRAMUSCULAR | Status: AC
  Administered 2011-08-16: 08:00:00 via INTRAVENOUS
  Filled 2011-08-16: qty 1

## 2011-08-16 MED ORDER — POTASSIUM CHLORIDE CRYS ER 20 MEQ PO TBCR
40.0000 meq | EXTENDED_RELEASE_TABLET | Freq: Once | ORAL | Status: AC
  Administered 2011-08-16: 40 meq via ORAL
  Filled 2011-08-16 (×2): qty 1

## 2011-08-16 MED ORDER — INSULIN ASPART PROT & ASPART (70-30 MIX) 100 UNIT/ML ~~LOC~~ SUSP
SUBCUTANEOUS | Status: AC
  Filled 2011-08-16: qty 3

## 2011-08-16 MED ORDER — ASPIRIN 325 MG PO TABS
325.0000 mg | ORAL_TABLET | Freq: Once | ORAL | Status: AC
  Administered 2011-08-16: 325 mg via ORAL
  Filled 2011-08-16: qty 1

## 2011-08-16 NOTE — ED Notes (Signed)
Security called to room and pt wanded clothing and belongings removed and placed at charge RN desk

## 2011-08-16 NOTE — ED Provider Notes (Signed)
History     CSN: [id number] Arrival date & time: 08/16/2011  5:31 AM   First MD Initiated Contact with Patient 08/16/11 0532      Chief Complaint  Patient presents with  . Addiction Problem  . Suicidal  . Chest Pain    (Consider location/radiation/quality/duration/timing/severity/associated sxs/prior treatment) HPI The patient is a 60 year old male who presents one hour after smoking crack cocaine. Patient reports having chest pain. He has been using cocaine since September. Prior to that he had been substance free for 5 years. He denies any other substance use. Patient has been having chest pain with the use of cocaine recently. He reports blurry vision in both eyes for the past 6 months. He has a history of diabetes as well as hypertension and has not been taking his medications. He did not know what his blood sugars have been recently. He denies any fevers. He's not had any shortness of breath or cough. The patient does have history of multiple orthopedic surgeries and complains of some back pain associated with this. He also has a history of gout and complains of some discomfort with this as well. Patient has left-sided chest pain without radiation. He says this is a 9/10. Nothing has made it better. Recently smoking crack has made it recur. Of note patient also has slight facial asymmetry noted in a loss of the skin folds over the left side of his forehead as well as left eye ptosis. Patient has no knowledge of having this previously. He is alert and oriented. Patient also complains of suicidality. He reports that he's been having thoughts of killing himself though he doesn't have an obvious plan. There are no other associated or modifying factors. Past Medical History  Diagnosis Date  . Diabetes mellitus   . Hypertension     Past Surgical History  Procedure Date  . Back surgery     History reviewed. No pertinent family history.  History  Substance Use Topics  . Smoking status:  Never Smoker   . Smokeless tobacco: Not on file  . Alcohol Use: No      Review of Systems  Constitutional: Negative.   HENT: Negative.   Eyes: Positive for visual disturbance.  Respiratory: Negative.  Negative for cough and shortness of breath.   Cardiovascular: Positive for chest pain.  Gastrointestinal: Negative.   Genitourinary: Negative.   Musculoskeletal: Positive for myalgias, back pain, joint swelling and arthralgias.  Skin: Negative.   Neurological: Positive for facial asymmetry.  Hematological: Negative.   Psychiatric/Behavioral: Positive for suicidal ideas.  All other systems reviewed and are negative.    Allergies  Review of patient's allergies indicates no known allergies.  Home Medications     BP 162/93  Pulse 70  Temp(Src) 97.9 F (36.6 C) (Oral)  Resp 20  SpO2 100%  Physical Exam  Nursing note and vitals reviewed. Constitutional: He is oriented to person, place, and time. He appears well-developed and well-nourished. No distress.  HENT:  Head: Normocephalic and atraumatic.  Eyes: Conjunctivae and EOM are normal. Pupils are equal, round, and reactive to light.       Patient with left eye ptosis  Neck: Normal range of motion.  Cardiovascular: Normal rate, regular rhythm and intact distal pulses.  Exam reveals no gallop and no friction rub.   No murmur heard. Pulmonary/Chest: Effort normal and breath sounds normal. No respiratory distress. He has no wheezes. He has no rales. He exhibits no tenderness.  Abdominal: Soft. Bowel sounds are normal.  He exhibits no distension. There is no tenderness. There is no rebound and no guarding.  Musculoskeletal: He exhibits tenderness.       Patient has tenderness to palpation in the right lower paraspinal muscles, he also has swelling consistent with his gout at the left great toe.  Neurological: He is alert and oriented to person, place, and time. No cranial nerve deficit. He exhibits normal muscle tone.  Coordination normal.       Patient with left eye ptosis as well as loss of the 4 in full to the left side of his face. He does not have any left-sided facial droop noted when asked to smile and the fundus cheeks.  Skin: Skin is warm and dry. No rash noted.  Psychiatric: He exhibits a depressed mood. He expresses suicidal ideation. He expresses no suicidal plans.    ED Course  Procedures (including critical care time)  Labs Reviewed  CBC - Abnormal; Notable for the following:    Hemoglobin 12.8 (*)    HCT 37.7 (*)    All other components within normal limits  COMPREHENSIVE METABOLIC PANEL - Abnormal; Notable for the following:    Potassium 2.8 (*)    Glucose, Bld 367 (*)    Total Protein 8.5 (*)    All other components within normal limits  URINALYSIS, ROUTINE W REFLEX MICROSCOPIC - Abnormal; Notable for the following:    Specific Gravity, Urine 1.041 (*)    Glucose, UA >1000 (*)    Hgb urine dipstick SMALL (*)    Ketones, ur 15 (*)    Protein, ur 30 (*)    All other components within normal limits  SALICYLATE LEVEL - Abnormal; Notable for the following:    Salicylate Lvl 123456 (*)    All other components within normal limits  GLUCOSE, CAPILLARY - Abnormal; Notable for the following:    Glucose-Capillary 348 (*)    All other components within normal limits  DIFFERENTIAL  LIPASE, BLOOD  URINE RAPID DRUG SCREEN (HOSP PERFORMED)  ACETAMINOPHEN LEVEL  ETHANOL  TROPONIN I  URINE MICROSCOPIC-ADD ON  POCT CBG MONITORING  POCT CBG MONITORING   Dg Chest 2 View  08/16/2011  *RADIOLOGY REPORT*  Clinical Data: Chest pain  CHEST - 2 VIEW  Comparison: 06/29/2009 CT  Findings: Mildly tortuous thoracic aorta.  Lungs are clear.  No pleural effusion or pneumothorax.  Multilevel degenerative changes.  IMPRESSION: No acute cardiopulmonary process identified.  Original Report Authenticated By: Suanne Marker, M.D.   Ct Head Wo Contrast  08/16/2011  *RADIOLOGY REPORT*  Clinical Data: Left  eye droop/vision changes.  CT HEAD WITHOUT CONTRAST  Technique:  Contiguous axial images were obtained from the base of the skull through the vertex without contrast.  Comparison: None.  Findings: Periventricular and subcortical white matter hypodensities are most in keeping with chronic microangiopathic change. Focus of midline fat attenuation.  There is no evidence for acute hemorrhage, hydrocephalus, mass lesion, or abnormal extra- axial fluid collection.  No definite CT evidence for acute infarction.  The visualized paranasal sinuses and mastoid air cells are predominately clear.  IMPRESSION: No acute intracranial abnormality identified.  Original Report Authenticated By: Suanne Marker, M.D.     Date: 08/16/2011  Rate: 59  Rhythm: sinus bradycardia  QRS Axis: normal  Intervals: normal  ST/T Wave abnormalities: normal  Conduction Disutrbances:none  Narrative Interpretation: No acute ischemic changes  Old EKG Reviewed: none available  1. Chest pain   2. Suicidal ideation   3. Cocaine  abuse       MDM  Patient was admitted and evaluated by myself. Patient complained of being suicidal and also having had recent crack cocaine use with chest pain. Patient had a workup with medical clearance as well as evaluation of his numerous other complaints that stem from his poor adherence to previous medication regimens since he resumed crack use months ago. The patient's chest pain he was given a dose of aspirin. He had an EKG that was unremarkable. Patient had troponin, CBC, and BMP ordered. Chest x-ray was also performed. Patient did complain of some visual changes that have been present for 6 months. He also had some facial asymmetry noted. This appeared to involve the upper and middle portions of the left side of his face only. If patient did feel like this was a difference when he looked at himself in the mirror we did perform a head CT. Urine drug screen, Tylenol, salicylate, alcohol level were  ordered as well. Patient had a fingerstick that was 348. He was given 5 units of subcutaneous insulin for this. A urinalysis was performed to evaluate for any possible urinary tract infections that could be contributing to this and chest x-ray have been ordered as well. [facility] will be contacted by nursing staff to evaluate the patient here. No IVC paperwork has been completed as of yet the patient is willingly remaining in the emergency department. He did not have a definite plan but does endorse suicidality. This is in the context of recent cocaine use as well. Patient also desires detox services. Patient had CBC returned with no evidence of leukocytosis and with a slightly elevated hemoglobin. At this time the remainder of his workup is pending.  6:41 AM Renal panel was remarkable for a potassium of 2.8. This was replaced with 40 mEq of potassium. Patient had normal liver function and lipase. His salicylate level was less than 2. Tylenol and alcohol level were negative. Troponin level was negative. Head CT and chest x-ray were unremarkable. Patient's very mild facial findings weren't not in distribution of atypical ischemic stroke. He did not have any drooping of the face. He was able to ambulate with antalgic gait but without other abnormalities. [facility] will be contacted to assist [place] in disposition of the patient. At this time remaining pending labs include urinalysis and urine drug screen. I anticipate urinalysis will be unremarkable and urine drug screen will demonstrate cocaine. Patient at this point is medically clear. [facility] will be contacted. Patient's blood glucose and vital signs as well as his urinalysis result will continue to be monitored.  7:13 AM Patient's visual acuity was 20/40 in the right eye and 20/30 in the left eye.  Patient was given 1 mg of Ativan IV for his chest pain. Patient had urinalysis that did show glucosuria which is not  surprising given the patient's been off of his medications. 15 ketones are present in urine. Patient did not have a gap on his renal panel.  Repeat fingerstick has been ordered. Patient remained laying comfortably in bed. He did complain of not attend chest pain. An order for morphine 4 mg IV was placed. At this time the patient continues to complain of chest pain. Given that patient will likely require admission for chest pain rule out before he can be admitted to a psychiatric facility patient will be transferred to [facility] where his doctors who are associated with [facility] can care for him.  Repeat fingerstick was 296. Patient  will be written for an additional 5 units of subcutaneous insulin.  Patient was accepted by Dr. Lyda Perone for transfer.         Chauncy Passy, MD 08/16/11 [phone]

## 2011-08-16 NOTE — ED Notes (Signed)
[facility] notified will be in soon to evaluate pt

## 2011-08-16 NOTE — ED Notes (Signed)
House coverage at [facility] notified can send a sitter at [phone]

## 2011-08-16 NOTE — ED Notes (Signed)
Pt states that he is supposed to take meds for HTN, DM, and cholesterol but he has not been compliant due to drug addiction. Pt states that he has had appointments to have his CP evaluated  but was "dropped because he has missed two appointments.

## 2011-08-16 NOTE — ED Notes (Signed)
IV removed for charting purposes. Pt transferred with IV intact.

## 2011-08-16 NOTE — ED Notes (Signed)
Pt states that he needs a lot of help states that he smoked crack one hour ago. Pt with CP, neck, lower back pain, and gout in left foot as well. Pt is requesting detox states that he is to the point he no longer cares

## 2011-08-26 ENCOUNTER — Encounter (HOSPITAL_COMMUNITY): Payer: Self-pay | Admitting: Emergency Medicine

## 2011-08-26 ENCOUNTER — Observation Stay (HOSPITAL_COMMUNITY)
Admission: EM | Admit: 2011-08-26 | Discharge: 2011-08-27 | Disposition: A | Discharge status: Home / Self Care | Payer: Medicare Other | Attending: Infectious Diseases | Admitting: Infectious Diseases

## 2011-08-26 ENCOUNTER — Emergency Department (HOSPITAL_COMMUNITY): Payer: Medicare Other

## 2011-08-26 ENCOUNTER — Other Ambulatory Visit: Payer: Self-pay

## 2011-08-26 DIAGNOSIS — R002 Palpitations: Secondary | ICD-10-CM | POA: Insufficient documentation

## 2011-08-26 DIAGNOSIS — Z23 Encounter for immunization: Secondary | ICD-10-CM | POA: Insufficient documentation

## 2011-08-26 DIAGNOSIS — R42 Dizziness and giddiness: Secondary | ICD-10-CM | POA: Insufficient documentation

## 2011-08-26 DIAGNOSIS — B20 Human immunodeficiency virus [HIV] disease: Secondary | ICD-10-CM | POA: Diagnosis not present

## 2011-08-26 DIAGNOSIS — R079 Chest pain, unspecified: Principal | ICD-10-CM | POA: Insufficient documentation

## 2011-08-26 DIAGNOSIS — Z21 Asymptomatic human immunodeficiency virus [HIV] infection status: Secondary | ICD-10-CM | POA: Insufficient documentation

## 2011-08-26 DIAGNOSIS — E119 Type 2 diabetes mellitus without complications: Secondary | ICD-10-CM | POA: Insufficient documentation

## 2011-08-26 DIAGNOSIS — I10 Essential (primary) hypertension: Secondary | ICD-10-CM | POA: Insufficient documentation

## 2011-08-26 DIAGNOSIS — E785 Hyperlipidemia, unspecified: Secondary | ICD-10-CM | POA: Insufficient documentation

## 2011-08-26 DIAGNOSIS — R0602 Shortness of breath: Secondary | ICD-10-CM | POA: Insufficient documentation

## 2011-08-26 DIAGNOSIS — F1410 Cocaine abuse, uncomplicated: Secondary | ICD-10-CM | POA: Insufficient documentation

## 2011-08-26 HISTORY — DX: Headache: R51

## 2011-08-26 LAB — CARDIAC PANEL(CRET KIN+CKTOT+MB+TROPI)
CK, MB: 5.3 ng/mL — ABNORMAL HIGH (ref 0.3–4.0)
Relative Index: 4.2 — ABNORMAL HIGH (ref 0.0–2.5)
Relative Index: 4.4 — ABNORMAL HIGH (ref 0.0–2.5)
Relative Index: 4.7 — ABNORMAL HIGH (ref 0.0–2.5)
Total CK: 112 U/L (ref 7–232)
Troponin I: <0.3 ng/mL (ref <0.30)
Troponin I: <0.3 ng/mL (ref <0.30)
Troponin I: <0.3 ng/mL (ref <0.30)

## 2011-08-26 LAB — GLUCOSE, CAPILLARY
Glucose-Capillary: 234 mg/dL — ABNORMAL HIGH (ref 70–99)
Glucose-Capillary: 246 mg/dL — ABNORMAL HIGH (ref 70–99)
Glucose-Capillary: 242 mg/dL — ABNORMAL HIGH (ref 70–99)

## 2011-08-26 LAB — CREATININE, SERUM
Creatinine, Ser: 0.75 mg/dL (ref 0.50–1.35)
GFR calc Af Amer: >90 mL/min (ref >90)
GFR calc non Af Amer: >90 mL/min (ref >90)

## 2011-08-26 LAB — POCT I-STAT, CHEM 8
BUN: 13 mg/dL (ref 6–23)
Chloride: 100 mEq/L (ref 96–112)
HCT: 38 % — ABNORMAL LOW (ref 39.0–52.0)
Sodium: 138 mEq/L (ref 135–145)
TCO2: 25 mmol/L (ref 0–100)

## 2011-08-26 LAB — URINALYSIS, ROUTINE W REFLEX MICROSCOPIC
Glucose, UA: >1000 mg/dL — AB
Leukocytes, UA: NEGATIVE
Nitrite: NEGATIVE
pH: 5.5 (ref 5.0–8.0)

## 2011-08-26 LAB — APTT: aPTT: 30 seconds (ref 24–37)

## 2011-08-26 LAB — CBC
HCT: 35.7 % — ABNORMAL LOW (ref 39.0–52.0)
HCT: 35.2 % — ABNORMAL LOW (ref 39.0–52.0)
Hemoglobin: 12.3 g/dL — ABNORMAL LOW (ref 13.0–17.0)
Hemoglobin: 11.9 g/dL — ABNORMAL LOW (ref 13.0–17.0)
MCH: 27.1 pg (ref 26.0–34.0)
MCV: 78.6 fL (ref 78.0–100.0)
RBC: 4.54 MIL/uL (ref 4.22–5.81)
RBC: 4.44 MIL/uL (ref 4.22–5.81)
WBC: 3.7 10*3/uL — ABNORMAL LOW (ref 4.0–10.5)

## 2011-08-26 LAB — URINE MICROSCOPIC-ADD ON

## 2011-08-26 LAB — POCT I-STAT TROPONIN I: Troponin i, poc: 0.01 ng/mL (ref 0.00–0.08)

## 2011-08-26 LAB — HEMOGLOBIN A1C
Hgb A1c MFr Bld: 8.3 % — ABNORMAL HIGH (ref <5.7)
Mean Plasma Glucose: 192 mg/dL — ABNORMAL HIGH (ref <117)

## 2011-08-26 LAB — COMPREHENSIVE METABOLIC PANEL
ALT: 19 U/L (ref 0–53)
AST: 17 U/L (ref 0–37)
Alkaline Phosphatase: 72 U/L (ref 39–117)
CO2: 23 mEq/L (ref 19–32)
Calcium: 8.5 mg/dL (ref 8.4–10.5)
GFR calc Af Amer: >90 mL/min (ref >90)
Glucose, Bld: 253 mg/dL — ABNORMAL HIGH (ref 70–99)
Potassium: 3.5 mEq/L (ref 3.5–5.1)
Sodium: 137 mEq/L (ref 135–145)
Total Protein: 7.3 g/dL (ref 6.0–8.3)

## 2011-08-26 MED ORDER — INFLUENZA VIRUS VACC SPLIT PF IM SUSP
0.5000 mL | INTRAMUSCULAR | Status: AC
  Administered 2011-08-27: 0.5 mL via INTRAMUSCULAR
  Filled 2011-08-26: qty 0.5

## 2011-08-26 MED ORDER — ONDANSETRON HCL 4 MG/2ML IJ SOLN: 4.0000 mg | Freq: Four times a day (QID) | INTRAMUSCULAR | Status: DC | PRN

## 2011-08-26 MED ORDER — INSULIN ASPART 100 UNIT/ML ~~LOC~~ SOLN
0.0000 [IU] | Freq: Three times a day (TID) | SUBCUTANEOUS | Status: DC
Start: 2011-08-27 — End: 2011-08-27
  Administered 2011-08-27: 3 [IU] via SUBCUTANEOUS
  Administered 2011-08-27 (×2): 5 [IU] via SUBCUTANEOUS
  Filled 2011-08-26: qty 3

## 2011-08-26 MED ORDER — MORPHINE SULFATE 2 MG/ML IJ SOLN: 1.0000 mg | INTRAMUSCULAR | Status: DC | PRN

## 2011-08-26 MED ORDER — MORPHINE SULFATE 4 MG/ML IJ SOLN
INTRAMUSCULAR | Status: AC
  Administered 2011-08-26: 4 mg
  Filled 2011-08-26: qty 1

## 2011-08-26 MED ORDER — ONDANSETRON HCL 4 MG PO TABS: 4.0000 mg | ORAL_TABLET | Freq: Four times a day (QID) | ORAL | Status: DC | PRN

## 2011-08-26 MED ORDER — HYDROMORPHONE HCL PF 1 MG/ML IJ SOLN
INTRAMUSCULAR | Status: AC
  Administered 2011-08-26: 17:00:00
  Filled 2011-08-26: qty 1

## 2011-08-26 MED ORDER — NITROGLYCERIN 0.4 MG SL SUBL: 0.4000 mg | SUBLINGUAL_TABLET | SUBLINGUAL | Status: DC | PRN

## 2011-08-26 MED ORDER — SODIUM CHLORIDE 0.9 % IJ SOLN
3.0000 mL | Freq: Two times a day (BID) | INTRAMUSCULAR | Status: DC
  Administered 2011-08-26 – 2011-08-27 (×3): 3 mL via INTRAVENOUS

## 2011-08-26 MED ORDER — GI COCKTAIL ~~LOC~~
ORAL | Status: AC
  Administered 2011-08-26: 10:00:00
  Filled 2011-08-26: qty 30

## 2011-08-26 MED ORDER — ENOXAPARIN SODIUM 40 MG/0.4ML ~~LOC~~ SOLN
40.0000 mg | SUBCUTANEOUS | Status: DC
  Administered 2011-08-26 – 2011-08-27 (×2): 40 mg via SUBCUTANEOUS
  Filled 2011-08-26 (×2): qty 0.4

## 2011-08-26 MED ORDER — ZOLPIDEM TARTRATE 5 MG PO TABS: 5.0000 mg | ORAL_TABLET | Freq: Every evening | ORAL | Status: DC | PRN

## 2011-08-26 MED ORDER — SODIUM CHLORIDE 0.9 % IV BOLUS (SEPSIS)
1000.0000 mL | INTRAVENOUS | Status: AC
  Administered 2011-08-26: 1000 mL via INTRAVENOUS

## 2011-08-26 MED ORDER — INSULIN GLARGINE 100 UNIT/ML ~~LOC~~ SOLN
15.0000 [IU] | Freq: Every day | SUBCUTANEOUS | Status: DC
  Administered 2011-08-26: 15 [IU] via SUBCUTANEOUS
  Filled 2011-08-26: qty 3

## 2011-08-26 MED ORDER — ASPIRIN EC 81 MG PO TBEC
81.0000 mg | DELAYED_RELEASE_TABLET | Freq: Every day | ORAL | Status: DC
  Administered 2011-08-26 – 2011-08-27 (×2): 81 mg via ORAL
  Filled 2011-08-26 (×2): qty 1

## 2011-08-26 MED ORDER — ACETAMINOPHEN 325 MG PO TABS
650.0000 mg | ORAL_TABLET | Freq: Four times a day (QID) | ORAL | Status: DC | PRN
  Administered 2011-08-26: 650 mg via ORAL
  Filled 2011-08-26: qty 2

## 2011-08-26 MED ORDER — PANTOPRAZOLE SODIUM 40 MG PO TBEC
40.0000 mg | DELAYED_RELEASE_TABLET | Freq: Every day | ORAL | Status: DC
  Administered 2011-08-26 – 2011-08-27 (×2): 40 mg via ORAL
  Filled 2011-08-26 (×2): qty 1

## 2011-08-26 MED ORDER — INSULIN ASPART 100 UNIT/ML ~~LOC~~ SOLN
0.0000 [IU] | SUBCUTANEOUS | Status: DC
  Administered 2011-08-26: 2 [IU] via SUBCUTANEOUS
  Administered 2011-08-26 (×2): 5 [IU] via SUBCUTANEOUS
  Filled 2011-08-26: qty 1
  Filled 2011-08-26: qty 3

## 2011-08-26 MED ORDER — ACETAMINOPHEN 650 MG RE SUPP: 650.0000 mg | Freq: Four times a day (QID) | RECTAL | Status: DC | PRN

## 2011-08-26 NOTE — ED Notes (Signed)
MD at bedside. 

## 2011-08-26 NOTE — H&P (Signed)
Hospital Admission Note Date: 08/26/2011  Patient name: Michael [name] Medical record number: [id number] Date of birth: 1950/10/10 Age: 60 y.o. Gender: male PCP: Katharina Caper, MD  Medical Service: Internal medicine teaching service - [facility]  Attending physician:  [name]    1st Contact: Dr. Orvilla Fus  Pager: [phone] 2nd Contact: Dr. Cloyde Reams  Pager: [phone]  After 5 pm or weekends: 1st Contact:  Pager: [phone] 2nd Contact:  Pager: [phone]  Chief Complaint: Chest pain  History of Present Illness: Patient is a 60 year old gentleman with past medical history significant for diabetes, hypertension, and hyperlipidemia who presents to the emergency room with complaints of chest pain that began at 8:30 PM on the evening prior to admission. He states he was resting in bed when he felt a cold chill and subsequently developed left sided, sharp, nonradiating chest pain. He notes this pain worsens with exertion and is relieved by rest. He describes his pain as intermittent and states the episodes are of approximately 5 minutes duration. He reports associated shortness of breath and sensation like "chest tightness". He admits to a cough that is nonproductive as well as dizziness associated with his chest pain. He also notes his dizziness occurs with movement and when standing up from a sitting position. He describes his dizziness as a sensation of lightheadedness and denies ringing in his ears, or sensation of spinning around him. He denies syncope, heart palpitations, nausea, vomiting, fever, chills, difficulty walking, or loss of consciousness. He has no other concerns or complaints at this time.   Meds: Metformin Lantus 30 units SQ at bedtime Patient reports he is taking a medication for his high cholesterol and his hypertension but is unable to recall the names of these medications  Allergies: NKDA  Past medical history: Diabetes mellitus Hypertension Hyperlipidemia  Family  history: Mother: Diabetes Father: Diabetes, cancer (unknown type) Brother: CAD with MI x2,  first MI at age 56  Social history: The patient lives alone in [place]. He is not currently working and is on disability as a result of his back surgeries. He has never smoked. He denies alcohol use. He admits to a history of crack cocaine use with recent relapse 10 days prior to hospitalization. He denies any other illicit drug use.  Review of Systems: Pertinent items are noted in HPI.  Physical Exam: Blood pressure 134/90, pulse 95, temperature 98.1 F (36.7 C), temperature source Oral, resp. rate 19, SpO2 99.00%. VItal signs reviewed and stable. GEN: No apparent distress.  Alert and oriented x 3.  Pleasant, conversant, and cooperative to exam. HEENT: head is autraumatic and normocephalic.  Neck is supple without palpable masses or lymphadenopathy.  No JVD or carotid bruits.  Vision intact.  EOMI.  PERRLA.  Sclerae anicteric.  Conjunctivae without pallor or injection. Mucous membranes are moist.  Oropharynx is without erythema, exudates, or other abnormal lesions.  Dentition is poor with caries present and some teeth missing. RESP:  Lungs are clear to ascultation bilaterally with good air movement.  No wheezes, ronchi, or rubs. CARDIOVASCULAR: regular rate, irregular rhythm.  Clear S1, S2, no murmurs, gallops, or rubs. ABDOMEN: soft, non-tender, non-distended.  Bowels sounds present in all quadrants and normoactive.  No palpable masses. EXT: warm and dry.  Peripheral pulses equal, intact, and +2 globally.  No clubbing or cyanosis.  No edema in bilateral lower extremities.  Onychomycosis noted. SKIN: warm and dry with normal turgor.  No rashes observed.  Circular areas of excoriation and scarring are present on  bilateral lower extremities.   NEURO: CN II-XII grossly intact.  Muscle strength +5/5 in bilateral upper and lower extremities.  Sensation is grossly intact.  No focal deficit.  Lab  results: Basic Metabolic Panel:  [facility] 08/26/11 0445  NA 138  K 3.5  CL 100  CO2 --  GLUCOSE 298*  BUN 13  CREATININE 0.90  CALCIUM --  MG --  PHOS --   CBC:  Basename 08/26/11 0445 08/26/11 0419  WBC -- 4.1  NEUTROABS -- --  HGB 12.9* 12.3*  HCT 38.0* 35.7*  MCV -- 78.6  PLT -- 160  D-Dimer:  Basename 08/26/11 0419  DDIMER 0.40   Coagulation:  Basename 08/26/11 0419  LABPROT 13.9  INR 1.05    Imaging results:  Dg Chest 2 View  08/26/2011  *RADIOLOGY REPORT*  Clinical Data: Chest pain, shortness of breath.  CHEST - 2 VIEW  Comparison: 08/16/2011  Findings: Mild tortuosity to the thoracic aorta.  Heart size within normal limits.  No focal consolidation, pleural effusion, or pneumothorax.  No acute osseous abnormality.  Multilevel degenerative changes.  IMPRESSION: No acute cardiopulmonary process.  Original Report Authenticated By: Suanne Marker, M.D.    Other results: EKG: Sinus tachycardia with PACs, normal intervals, normal axis. Nonspecific T-wave changes noted in the inferio-lateral leads.  Assessment & Plan by Problem:  #Chest pain: Patient is a 60 year old man with risk factors for coronary artery disease who presents with 12 hours of intermittent left-sided chest pain that is exertional in nature.  His EKG is negative for STEMI or findings consistent with tamponade/pericarditis, however his EKG is changed from prior with nonspecific ST changes present in leads 2, 3, aVF, V4, V5, and V6.   ACS/UA remains on the differential and will be worked up as outlined below.  It may be reasonable to pursue outpatient stress testing if his inpatient workup is negative.  Cocaine induced coronary vasospasm is also possible explanation for his chest pain as is poorly controlled GERD/gastritis.  CXR was negative for PTX, PNA, or mediastinal widening suggestive of aortic dissection.  His revised  Tandy Gaw is 3  placing him at low probability for PE; d-dimer was 0.4.  Will  not proceed with CTA for further workup as PE is unlikely. - Admit to telemetry, observation - Cycle cardiac enzymes - ASA daily - Check FLP and A1c - Repeat 12-lead EKG in the morning - Check UDS - Morphine and NTG for pain control - Patient is not a candidate for beta blocker as a result of cocaine use - Protonix for possible GERD - Consider outpatient/inpatient stress test   #Dizziness:  Patient's description of his dizziness is not consistent with vertigo or Mnire's.  Orthostatic hypotension, hypoglycemia, or dizziness as a result of drug use seem most probable.  Will proceed with work up outlined above for ACS. - monitor CBGs - Check orthostatic vital signs  #Cocaine use: Patient admits to cocaine use 10 days prior to his admission. It is possible he has also used more recently and this is contributing to his complaint of chest pain.  Has history of drug use and ongoing cocaine use increases his risk of acquiring STIs; will proceed with screening measures as outlined below. - Check UDS - Check HIV Ab, RPR, HCV Ab. HBsAg, and send urine for gonorrhea and chlamydia probe  #DM: Will check a hemoglobin A1c to assess his glycemic control and continue with Lantus and SSI for duration of his stay.  #HLD: Will check a  fasting lipid panel in the morning and confirm his outpatient cholesterol medication.  #HTN: Patient's blood pressure is currently stable and within acceptable limits.  He is unable to recall the name of his antihypertensive medication; will need to contact his PCP or touch base with pharmacy to determine his home medication.  Would consider addition or transition to ace inhibitor given his diagnosis of DM. - Continue to monitor  #DVT prophylaxis: lovenox  Signed: MILLS,KRISTIN 08/26/2011, 8:22 AM

## 2011-08-26 NOTE — ED Provider Notes (Signed)
History     CSN: [id number] Arrival date & time: 08/26/2011  3:41 AM   First MD Initiated Contact with Patient 08/26/11 [phone]      Chief Complaint  Patient presents with  . Shortness of Breath    for 2 years  . Chest Pain    for 2 years  . Palpitations    for 2 years    (Consider location/radiation/quality/duration/timing/severity/associated sxs/prior treatment) HPI Comments: Patient is a 60 year old male who presents after having acute onset of shortness of breath and palpitations that occurred at 8:30 PM yesterday. He states that this came on acutely, has been constant, nothing makes better or worse, no associated fevers, vomiting, headache. He does have associated chills and body aches from his calves bilaterally, thighs, back, chest, neck. He states he has a mild persistent cough. [facility] with [facility], substance abuse and started on a new medication of which he cannot remember the name. EMS reports that he was in a sinus tachycardia in route but no hypotension or fever.  Patient is a 60 y.o. male presenting with shortness of breath, chest pain, and palpitations. The history is provided by the patient.  Shortness of Breath  Episode onset: 8 hours prior to arrival. The onset was sudden. The problem occurs continuously. The problem has been unchanged. The problem is mild. The symptoms are relieved by nothing. The symptoms are aggravated by nothing. Associated symptoms include rhinorrhea, cough and shortness of breath. Pertinent negatives include no chest pain, no fever and no sore throat. Associated symptoms comments: Palpitations and chills. His past medical history does not include asthma.  Chest Pain Primary symptoms include shortness of breath, cough and palpitations. Pertinent negatives for primary symptoms include no fever.  The patient's medical history does not include asthma.  The palpitations also occurred with shortness of  breath.    Palpitations  Associated symptoms include cough and shortness of breath. Pertinent negatives include no fever and no chest pain.    Past Medical History  Diagnosis Date  . Diabetes mellitus   . Hypertension     Past Surgical History  Procedure Date  . Back surgery     History reviewed. No pertinent family history.  History  Substance Use Topics  . Smoking status: Never Smoker   . Smokeless tobacco: Not on file  . Alcohol Use: No      Review of Systems  Constitutional: Negative for fever.  HENT: Positive for rhinorrhea. Negative for sore throat.   Respiratory: Positive for cough and shortness of breath.   Cardiovascular: Positive for palpitations. Negative for chest pain.  All other systems reviewed and are negative.    Allergies  Review of patient's allergies indicates no known allergies.  Home Medications   Current Outpatient Rx  Name Route Sig Dispense Refill  . INSULIN GLARGINE 100 UNIT/ML [place] SOLN Subcutaneous Inject 30 Units into the skin at bedtime.      Marland Kitchen METFORMIN HCL 500 MG PO TABS Oral Take 1,000 mg by mouth 2 (two) times daily with a meal.      . PRESCRIPTION MEDICATION Oral Take 1 tablet by mouth daily. High blood pressure     . PRESCRIPTION MEDICATION  For anxiety PRN     . PRESCRIPTION MEDICATION  To slow heart rate down       BP 105/89  Pulse 103  Temp(Src) 98.2 F (36.8 C) (Oral)  Resp 18  SpO2 99%  Physical Exam  Nursing  note and vitals reviewed. Constitutional: He appears well-developed and well-nourished. No distress.  HENT:  Head: Normocephalic and atraumatic.  Mouth/Throat: Oropharynx is clear and moist. No oropharyngeal exudate.  Eyes: Conjunctivae and EOM are normal. Pupils are equal, round, and reactive to light. Right eye exhibits no discharge. Left eye exhibits no discharge. No scleral icterus.  Neck: Normal range of motion. Neck supple. No JVD present. No thyromegaly present.  Cardiovascular: Regular rhythm,  normal heart sounds and intact distal pulses.  Exam reveals no gallop and no friction rub.   No murmur heard.      Tachycardia, intermittent ectopy  Pulmonary/Chest: Effort normal and breath sounds normal. No respiratory distress. He has no wheezes. He has no rales.  Abdominal: Soft. Bowel sounds are normal. He exhibits no distension and no mass. There is no tenderness.  Musculoskeletal: Normal range of motion. He exhibits no edema and no tenderness.  Lymphadenopathy:    He has no cervical adenopathy.  Neurological: He is alert. Coordination normal.  Skin: Skin is warm and dry. No rash noted. No erythema.  Psychiatric: He has a normal mood and affect. His behavior is normal.    ED Course  Procedures (including critical care time)  Labs Reviewed  CBC - Abnormal; Notable for the following:    Hemoglobin 12.3 (*)    HCT 35.7 (*)    All other components within normal limits  URINALYSIS, ROUTINE W REFLEX MICROSCOPIC - Abnormal; Notable for the following:    Color, Urine AMBER (*) BIOCHEMICALS MAY BE AFFECTED BY COLOR   Appearance CLOUDY (*)    Glucose, UA >1000 (*)    Bilirubin Urine SMALL (*)    Ketones, ur 15 (*)    Protein, ur 30 (*)    All other components within normal limits  POCT I-STAT, CHEM 8 - Abnormal; Notable for the following:    Glucose, Bld 298 (*)    Hemoglobin 12.9 (*)    HCT 38.0 (*)    All other components within normal limits  URINE MICROSCOPIC-ADD ON - Abnormal; Notable for the following:    Squamous Epithelial / LPF MANY (*)    Bacteria, UA FEW (*)    Casts HYALINE CASTS (*)    All other components within normal limits  D-DIMER, QUANTITATIVE  PROTIME-INR  APTT  POCT I-STAT TROPONIN I  POCT I-STAT TROPONIN I  I-STAT TROPONIN I  I-STAT, CHEM 8  I-STAT TROPONIN I  URINE RAPID DRUG SCREEN (HOSP PERFORMED)   Dg Chest 2 View  08/26/2011  *RADIOLOGY REPORT*  Clinical Data: Chest pain, shortness of breath.  CHEST - 2 VIEW  Comparison: 08/16/2011  Findings:  Mild tortuosity to the thoracic aorta.  Heart size within normal limits.  No focal consolidation, pleural effusion, or pneumothorax.  No acute osseous abnormality.  Multilevel degenerative changes.  IMPRESSION: No acute cardiopulmonary process.  Original Report Authenticated By: Suanne Marker, M.D.     1. Chest pain       MDM  No signs of lower extremity edema, EKG shows normal sinus rhythm with occasional PVC or PAC. Will rule out electrolytes, PE, pneumonia. The patient does state that the first symptom that he had was body chills followed by body aches followed by palpitations and shortness of breath. He does not appear to be in any respiratory distress and has clear lung sounds on my exam  ED ECG REPORT   Date: 08/26/2011 0353  Rate: 109  Rhythm: sinus tachycardia  QRS Axis: normal  Intervals:  normal  ST/T Wave abnormalities: nonspecific T wave changes and Inferior and lateral precordial nonspecific T-wave findings  Conduction Disutrbances:none  Narrative Interpretation: Pulse has increased from 59-109 from last EKG to today  Old EKG Reviewed: Compared with 08/16/2011, now has nonspecific T-wave findings   ED ECG REPORT   Date: 08/26/2011 0551  Rate: 80  Rhythm: normal sinus rhythm  QRS Axis: normal  Intervals: normal  ST/T Wave abnormalities: nonspecific T wave changes  Conduction Disutrbances:none  Narrative Interpretation: non spec T wave changes improved from priro  Old EKG Reviewed: changes noted and Improved from prior ECG   Troponin negative x2 sets, d-dimer negative, electrolytes with hyperglycemia but no other acute findings, no leukocytosis, chest x-ray negative.  EKG essentially unchanged with nonspecific T wave findings.   Discussed care with internal medicine teaching service resident who will admit  Johnna Acosta, MD 08/26/11 [phone]

## 2011-08-26 NOTE — ED Notes (Signed)
Feels chills coming and going.

## 2011-08-26 NOTE — Progress Notes (Signed)
Internal Medicine Teaching Service Attending Note Date: 08/26/2011  Patient name: Michael [name]  Medical record number: [id number]  Date of birth: Jun 26, 1951   I have seen and discussed pt with house staff. Please see their note for complete details: 60 yo M  with PMHx diabetes, hypertension, and hyperlipidemia who presents to the emergency room with chest pain x 24 hours. . He states he was resting in bed when he felt a cold chill and subsequently developed left sided, sharp, nonradiating chest pain. He notes this pain worsens with exertion and is relieved by rest. He describes his pain as intermittent.  He reports associated shortness of breath and sensation like "chest tightness". Currently he feels like the pain is better but not completely resolved.   Physical Exam: Blood pressure 121/86, pulse 99, temperature 97.6 F (36.4 C), temperature source Oral, resp. rate 16, SpO2 100.00%. BP 121/86  Pulse 99  Temp(Src) 97.6 F (36.4 C) (Oral)  Resp 16  SpO2 100%  General Appearance:    Alert, cooperative, no distress, appears stated age  Head:    Normocephalic, without obvious abnormality, atraumatic  Eyes:    PERRL, EOM's intact, arcus senilus,         Throat:   Lips, mucosa, and tongue normal; some teeth missing.   Neck:   Supple, symmetrical, trachea midline, no adenopathy;       thyroid:  No enlargement/tenderness/nodules; no carotid   bruit or JVD     Lungs:     Clear to auscultation bilaterally, respirations unlabored  Chest wall:    No tenderness or deformity. Non-tender to palpation.   Heart:    Regular rate and rhythm, S1 and S2 normal, no murmur, rub   or gallop  Abdomen:     Soft, non-tender, bowel sounds active all four quadrants,    no masses, no organomegaly        Extremities:   Extremities normal, atraumatic, no cyanosis or edema. No diabetic foot ulcers.   Pulses:   2+ and symmetric all extremities  Skin:   Skin color, texture, turgor normal, no rashes or lesions    Lymph nodes:   Cervical nodes normal  Neurologic:   CNII-XII intact. Normal sensation     Lab results: Results for orders placed during the hospital encounter of 08/26/11 (from the past 24 hour(s))  D-DIMER, QUANTITATIVE     Status: Normal   Collection Time   08/26/11  4:19 AM      Component Value Range   D-Dimer, Quant 0.40  0.00 - 0.48 (ug/mL-FEU)  CBC     Status: Abnormal   Collection Time   08/26/11  4:19 AM      Component Value Range   WBC 4.1  4.0 - 10.5 (K/uL)   RBC 4.54  4.22 - 5.81 (MIL/uL)   Hemoglobin 12.3 (*) 13.0 - 17.0 (g/dL)   HCT 35.7 (*) 39.0 - 52.0 (%)   MCV 78.6  78.0 - 100.0 (fL)   MCH 27.1  26.0 - 34.0 (pg)   MCHC 34.5  30.0 - 36.0 (g/dL)   RDW 12.8  11.5 - 15.5 (%)   Platelets 160  150 - 400 (K/uL)  PROTIME-INR     Status: Normal   Collection Time   08/26/11  4:19 AM      Component Value Range   Prothrombin Time 13.9  11.6 - 15.2 (seconds)   INR 1.05  0.00 - 1.49   APTT     Status: Normal  Collection Time   08/26/11  4:19 AM      Component Value Range   aPTT 30  24 - 37 (seconds)  POCT I-STAT TROPONIN I     Status: Normal   Collection Time   08/26/11  4:42 AM      Component Value Range   Troponin i, poc 0.01  0.00 - 0.08 (ng/mL)   Comment 3           POCT I-STAT, CHEM 8     Status: Abnormal   Collection Time   08/26/11  4:45 AM      Component Value Range   Sodium 138  135 - 145 (mEq/L)   Potassium 3.5  3.5 - 5.1 (mEq/L)   Chloride 100  96 - 112 (mEq/L)   BUN 13  6 - 23 (mg/dL)   Creatinine, Ser 0.90  0.50 - 1.35 (mg/dL)   Glucose, Bld 298 (*) 70 - 99 (mg/dL)   Calcium, Ion 1.13  1.12 - 1.32 (mmol/L)   TCO2 25  0 - 100 (mmol/L)   Hemoglobin 12.9 (*) 13.0 - 17.0 (g/dL)   HCT 38.0 (*) 39.0 - 52.0 (%)  URINALYSIS, ROUTINE W REFLEX MICROSCOPIC     Status: Abnormal   Collection Time   08/26/11  5:54 AM      Component Value Range   Color, Urine AMBER (*) YELLOW    Appearance CLOUDY (*) CLEAR    Specific Gravity, Urine 1.030  1.005 - 1.030     pH 5.5  5.0 - 8.0    Glucose, UA >1000 (*) NEGATIVE (mg/dL)   Hgb urine dipstick NEGATIVE  NEGATIVE    Bilirubin Urine SMALL (*) NEGATIVE    Ketones, ur 15 (*) NEGATIVE (mg/dL)   Protein, ur 30 (*) NEGATIVE (mg/dL)   Urobilinogen, UA 1.0  0.0 - 1.0 (mg/dL)   Nitrite NEGATIVE  NEGATIVE    Leukocytes, UA NEGATIVE  NEGATIVE   URINE MICROSCOPIC-ADD ON     Status: Abnormal   Collection Time   08/26/11  5:54 AM      Component Value Range   Squamous Epithelial / LPF MANY (*) RARE    WBC, UA 3-6  <3 (WBC/hpf)   RBC / HPF 0-2  <3 (RBC/hpf)   Bacteria, UA FEW (*) RARE    Casts HYALINE CASTS (*) NEGATIVE    Urine-Other MUCOUS PRESENT    POCT I-STAT TROPONIN I     Status: Normal   Collection Time   08/26/11  6:19 AM      Component Value Range   Troponin i, poc 0.01  0.00 - 0.08 (ng/mL)   Comment 3           HEMOGLOBIN A1C     Status: Abnormal   Collection Time   08/26/11  8:26 AM      Component Value Range   Hemoglobin A1C 8.3 (*) <5.7 (%)   Mean Plasma Glucose 192 (*) <117 (mg/dL)  COMPREHENSIVE METABOLIC PANEL     Status: Abnormal   Collection Time   08/26/11  8:26 AM      Component Value Range   Sodium 137  135 - 145 (mEq/L)   Potassium 3.5  3.5 - 5.1 (mEq/L)   Chloride 104  96 - 112 (mEq/L)   CO2 23  19 - 32 (mEq/L)   Glucose, Bld 253 (*) 70 - 99 (mg/dL)   BUN 12  6 - 23 (mg/dL)   Creatinine, Ser 0.81  0.50 - 1.35 (mg/dL)  Calcium 8.5  8.4 - 10.5 (mg/dL)   Total Protein 7.3  6.0 - 8.3 (g/dL)   Albumin 3.3 (*) 3.5 - 5.2 (g/dL)   AST 17  0 - 37 (U/L)   ALT 19  0 - 53 (U/L)   Alkaline Phosphatase 72  39 - 117 (U/L)   Total Bilirubin 0.4  0.3 - 1.2 (mg/dL)   GFR calc non Af Amer >90  >90 (mL/min)   GFR calc Af Amer >90  >90 (mL/min)  CARDIAC PANEL(CRET KIN+CKTOT+MB+TROPI)     Status: Abnormal   Collection Time   08/26/11  8:27 AM      Component Value Range   Total CK 132  7 - 232 (U/L)   CK, MB 5.5 (*) 0.3 - 4.0 (ng/mL)   Troponin I <0.30  <0.30 (ng/mL)   Relative  Index 4.2 (*) 0.0 - 2.5   GLUCOSE, CAPILLARY     Status: Abnormal   Collection Time   08/26/11  8:46 AM      Component Value Range   Glucose-Capillary 234 (*) 70 - 99 (mg/dL)  GLUCOSE, CAPILLARY     Status: Abnormal   Collection Time   08/26/11 12:07 PM      Component Value Range   Glucose-Capillary 192 (*) 70 - 99 (mg/dL)  CBC     Status: Abnormal   Collection Time   08/26/11 12:30 PM      Component Value Range   WBC 3.7 (*) 4.0 - 10.5 (K/uL)   RBC 4.44  4.22 - 5.81 (MIL/uL)   Hemoglobin 11.9 (*) 13.0 - 17.0 (g/dL)   HCT 35.2 (*) 39.0 - 52.0 (%)   MCV 79.3  78.0 - 100.0 (fL)   MCH 26.8  26.0 - 34.0 (pg)   MCHC 33.8  30.0 - 36.0 (g/dL)   RDW 12.9  11.5 - 15.5 (%)   Platelets 164  150 - 400 (K/uL)  CREATININE, SERUM     Status: Normal   Collection Time   08/26/11 12:30 PM      Component Value Range   Creatinine, Ser 0.75  0.50 - 1.35 (mg/dL)   GFR calc non Af Amer >90  >90 (mL/min)   GFR calc Af Amer >90  >90 (mL/min)  CARDIAC PANEL(CRET KIN+CKTOT+MB+TROPI)     Status: Abnormal   Collection Time   08/26/11 12:30 PM      Component Value Range   Total CK 123  7 - 232 (U/L)   CK, MB 5.4 (*) 0.3 - 4.0 (ng/mL)   Troponin I <0.30  <0.30 (ng/mL)   Relative Index 4.4 (*) 0.0 - 2.5     Imaging results:  Dg Chest 2 View  08/26/2011  *RADIOLOGY REPORT*  Clinical Data: Chest pain, shortness of breath.  CHEST - 2 VIEW  Comparison: 08/16/2011  Findings: Mild tortuosity to the thoracic aorta.  Heart size within normal limits.  No focal consolidation, pleural effusion, or pneumothorax.  No acute osseous abnormality.  Multilevel degenerative changes.  IMPRESSION: No acute cardiopulmonary process.  Original Report Authenticated By: Suanne Marker, M.D.    Assessment and Plan: I agree with the formulated Assessment and Plan with the following changes:  Patient ID: Michael [name], male   DOB: 1951-04-13, 60 y.o.   MRN: [id number]  Chest Pain- he has two negative CE's so far (his  relative percent was elevated but his total CK was <200). Will continue to monitor these, watch on tele. He has multiple risk factors which we  will try to improve. He will continue his ASA. He may benefit from CV eval and chemical stress test? DM- his FSG are elevated in hospital. Will try to improve these in hospital.  Hyperlipidemia- will recheck his lipids and intervene as needed.  Cocaine abuse- will monitor for withdrawal, make resources available to him for outpt detox if desires.

## 2011-08-26 NOTE — ED Notes (Signed)
Pt continues to complain of some epigastric discomfort. Nursing to complete all available orders to try to transfer pt to 3700.

## 2011-08-27 ENCOUNTER — Other Ambulatory Visit: Payer: Self-pay

## 2011-08-27 DIAGNOSIS — B20 Human immunodeficiency virus [HIV] disease: Secondary | ICD-10-CM | POA: Diagnosis not present

## 2011-08-27 LAB — GLUCOSE, CAPILLARY: Glucose-Capillary: 224 mg/dL — ABNORMAL HIGH (ref 70–99)

## 2011-08-27 LAB — CARDIAC PANEL(CRET KIN+CKTOT+MB+TROPI): CK, MB: 5 ng/mL — ABNORMAL HIGH (ref 0.3–4.0)

## 2011-08-27 LAB — BASIC METABOLIC PANEL
BUN: 13 mg/dL (ref 6–23)
Calcium: 8.9 mg/dL (ref 8.4–10.5)
Creatinine, Ser: 0.79 mg/dL (ref 0.50–1.35)
GFR calc Af Amer: >90 mL/min (ref >90)
GFR calc non Af Amer: >90 mL/min (ref >90)

## 2011-08-27 LAB — HEPATITIS B SURFACE ANTIGEN: Hepatitis B Surface Ag: NEGATIVE

## 2011-08-27 LAB — GC/CHLAMYDIA PROBE AMP, URINE
Chlamydia, Swab/Urine, PCR: NEGATIVE
GC Probe Amp, Urine: NEGATIVE

## 2011-08-27 LAB — LIPID PANEL
LDL Cholesterol: 56 mg/dL (ref 0–99)
Triglycerides: 153 mg/dL — ABNORMAL HIGH (ref <150)
VLDL: 31 mg/dL (ref 0–40)

## 2011-08-27 LAB — HIV ANTIBODY (ROUTINE TESTING W REFLEX): HIV: REACTIVE — AB

## 2011-08-27 MED ORDER — PAROXETINE HCL 20 MG PO TABS: 20.0000 mg | ORAL_TABLET | ORAL | Status: DC

## 2011-08-27 MED ORDER — PRAVASTATIN SODIUM 80 MG PO TABS: 80.0000 mg | ORAL_TABLET | Freq: Every day | ORAL | Status: DC

## 2011-08-27 MED ORDER — DILTIAZEM HCL ER COATED BEADS 120 MG PO CP24: 120.0000 mg | ORAL_CAPSULE | Freq: Every day | ORAL | Status: DC

## 2011-08-27 MED ORDER — ASPIRIN 81 MG PO TBEC: 81.0000 mg | DELAYED_RELEASE_TABLET | Freq: Every day | ORAL | Status: DC

## 2011-08-27 NOTE — Progress Notes (Signed)
Inpatient Diabetes Program Recommendations  AACE/ADA: New Consensus Statement on Inpatient Glycemic Control (2009)  Target Ranges:  Prepandial:   less than 140 mg/dL      Peak postprandial:   less than 180 mg/dL (1-2 hours)      Critically ill patients:  140 - 180 mg/dL   Reason for Visit: CBG's greater than 200 mg/dL  Inpatient Diabetes Program Recommendations Insulin - Basal: Please increase Lantus to 30 units daily. (this is home dose) Correction (SSI): Increase correction to moderate tid wc. HgbA1C: Note A1c= 8.3% indicating sub-optimal glycemic control.  Note: Will need follow up with primary MD regarding elevated A1c.

## 2011-08-27 NOTE — Progress Notes (Signed)
Patient ID: Michael [name], male   DOB: 29-Jul-1951, 60 y.o.   MRN: [id number] Internal Medicine Teaching Service Attending Note Date: 08/27/2011  Patient name: Michael [name]  Medical record number: [id number]  Date of birth: April 22, 1951    This patient has been seen and discussed with the house staff. Please see their note for complete details. I concur with their findings with the following additions/corrections: Pt without complaint. Plan is for pt to complete his w/u of CP as outpatient. He has a HIV elisa+. This will reflex to a Western Blot. Will also check CD4 and HIV RNA with genotype prior to d/c. His FSG was 250. Will need close f/u of Glc for d/c.  Bobby Rumpf 08/27/2011, 11:38 AM

## 2011-08-27 NOTE — Progress Notes (Signed)
CSW received a request by RN for a bus pass for pt at discharge. CSW provided a bus to pt. No further CSW needs at this time.   Darden Dates, MSW, [place]

## 2011-08-27 NOTE — Discharge Summary (Signed)
[facility] Discharge Note  Name: Michael [name] MRN: [id number] DOB: Mar 01, 1951 60 y.o.  Date of Admission: 08/26/2011  3:41 AM Date of Discharge: 08/27/2011 Attending Physician: Bobby Rumpf, MD  Discharge Diagnosis: Active Problems:  Chest Pain: Uncertain etiology  HIV test positive  Hypertension  Hyperlipidemia  Diabetes Mellitus  Cocaine Abuse  Discharge Medications: Current Discharge Medication List    START taking these medications   Details  aspirin EC 81 MG EC tablet Take 1 tablet (81 mg total) by mouth daily. Qty: 31 tablet, Refills: 0    diltiazem (CARDIZEM CD) 120 MG 24 hr capsule Take 1 capsule (120 mg total) by mouth daily.    PARoxetine (PAXIL) 20 MG tablet Take 1 tablet (20 mg total) by mouth every morning.    pravastatin (PRAVACHOL) 80 MG tablet Take 1 tablet (80 mg total) by mouth daily.      CONTINUE these medications which have NOT CHANGED   Details  insulin glargine (LANTUS) 100 UNIT/ML injection Inject 30 Units into the skin at bedtime.      metFORMIN (GLUCOPHAGE) 500 MG tablet Take 1,000 mg by mouth 2 (two) times daily with a meal.        STOP taking these medications     PRESCRIPTION MEDICATION      PRESCRIPTION MEDICATION      PRESCRIPTION MEDICATION         Disposition and follow-up:   Michael [name] was discharged from [facility] in Good condition. He is scheduled to follow-up with Dr. Marlou Porch at [facility] on 09/12/11 for further workup of his recurrent exertional chest pain.  He also has primary care follow-up with Dr. Imagene Riches 09/05/11 at 11:30am.  Finally, he has ID follow-up with [name] on 09/11/11 due to his reactive HIV ab test.  At the time this discharge summary was written, western blot confirmatory testing, HIV viral load, and CD4 were still pending.    Follow-up Appointments: Follow-up Information    Follow up with Bobby Rumpf, MD on 09/11/2011. (appt  [name] 09/11/11 at 9:30am)    Contact information:   [address]  [address]       Follow up with Candee Furbish, MD on 09/12/2011. (Appt Dr. Marlou Porch [facility]iology 09/12/11 at 9:30am)    Contact information:   [address] [place] [phone]       Follow up with [facility] M on 09/05/2011. (Appt Dr. Joaquim Lai 09/05/11 at 11:30am)    Contact information:   [address]  [place]         Discharge Orders    Future Appointments: Provider: Department: Dept Phone: Center:   09/11/2011 9:30 AM Bobby Rumpf, MD [facility] [phone] [facility]     Future Orders Please Complete By Expires   Diet - low sodium heart healthy      Increase activity slowly      Discharge instructions      Comments:   Please attend all your follow-up appointments   Call MD for:  temperature >100.4      Call MD for:  severe uncontrolled pain      Call MD for:  difficulty breathing, headache or visual disturbances         Consultations: None   Procedures Performed:  Dg Chest 2 View  08/26/2011  *RADIOLOGY REPORT*  Clinical Data:  Chest pain, shortness of breath.  CHEST - 2 VIEW  Comparison: 08/16/2011  Findings: Mild tortuosity to the thoracic aorta.  Heart size within normal limits.  No focal consolidation, pleural effusion, or pneumothorax.  No acute osseous abnormality.  Multilevel degenerative changes.  IMPRESSION: No acute cardiopulmonary process.  Original Report Authenticated By: Suanne Marker, M.D.   Dg Chest 2 View  08/16/2011  *RADIOLOGY REPORT*  Clinical Data: Chest pain  CHEST - 2 VIEW  Comparison: 06/29/2009 CT  Findings: Mildly tortuous thoracic aorta.  Lungs are clear.  No pleural effusion or pneumothorax.  Multilevel degenerative changes.  IMPRESSION: No acute cardiopulmonary process identified.  Original Report Authenticated By: Suanne Marker, M.D.   Ct Head Wo Contrast  08/16/2011  *RADIOLOGY REPORT*  Clinical Data: Left eye droop/vision changes.  CT HEAD WITHOUT CONTRAST  Technique:  Contiguous axial images were obtained from the base of the skull through the vertex without contrast.  Comparison: None.  Findings: Periventricular and subcortical white matter hypodensities are most in keeping with chronic microangiopathic change. Focus of midline fat attenuation.  There is no evidence for acute hemorrhage, hydrocephalus, mass lesion, or abnormal extra- axial fluid collection.  No definite CT evidence for acute infarction.  The visualized paranasal sinuses and mastoid air cells are predominately clear.  IMPRESSION: No acute intracranial abnormality identified.  Original Report Authenticated By: Suanne Marker, M.D.    Admission HPI:  Patient is a 60 year old gentleman with past medical history significant for diabetes, hypertension, and hyperlipidemia who presents to the emergency room with complaints of chest pain that began at 8:30 PM on the evening prior to admission. He states he was resting in bed when he felt a cold chill and subsequently developed left sided, sharp, nonradiating chest pain. He notes this pain worsens with exertion and is relieved by rest. He describes his pain as intermittent and states the episodes are of approximately 5 minutes duration. He reports associated shortness of breath and sensation like "chest tightness". He admits to a cough that is nonproductive as well as dizziness associated with his chest pain. He also notes his dizziness occurs with movement and when standing up from a sitting position. He describes his dizziness as a sensation of lightheadedness and denies ringing in his ears, or sensation of spinning around him. He denies syncope, heart palpitations, nausea, vomiting, fever, chills, difficulty walking, or loss of consciousness. He has no other concerns or complaints at this  time.   Hospital Course by problem list: Active Problems:   Chest Pain: Uncertain etiology.  Exertional in nature, non-radiating.  Has had similar episodes for a long time now.  Non-specific T wave changes in infero-lateral leads.  CEs negative x 3.  In setting of HTN, HLD, DM, we are referring him for outpt cardiology workup for possible cardiac etiology since his inpatient workup was negative.  Alternatively, this may be GERD/gastritis as well.  Musculoskeletal less likely since pain is not related to lifting things nor positional.      HIV test positive: Screening for HIV yielded positive HIV AB elisa result.  Western blot confirmatory test, CD4 count, and HIV viral load with reflex genotype pending at time of this discharge summary.  Patient told of this test result and importance of medication compliance and regular follow-up in HIV management emphasized.  Team explained that this result was not conclusive until the confirmatory test came back.  Fu with Dr [name] in [facility] scheduled.  Will call patient with the  western blot result, pt gave [place] his cell phone number to call.     Hypertension: BPs good during hospitalization on no meds.  Takes Cardizem at home, but this was not known on day of admission (and he was discharged the next day).  Recommended he restart his Cardizem and follow-up with his PCP.    Hyperlipidemia: Lipid panel checked during admission.  Tot Chol 121, HDL 34, Trigly 153, LDL 56.  At goal on current pravastatin dose.  May actually be able to decrease pravastatin dose since he is on a high dose, but will defer this to his PCP, who manages his hyperlipidemia.     Diabetes Mellitus: CBGs consistently in 200s during hospitalization.  HbA1c checked and was 8.3%, was 8.4% two years ago in our records.  Patient was taking metformin and Lantus (30units QHS) at home.     Cocaine Abuse: Patient reports using this once 10 days before admission, first time in years.  Counseled patients on  the dangers of cocaine use for his heart and blood pressure.  Discharge Vitals:  BP 117/77  Pulse 104  Temp(Src) 97.7 F (36.5 C) (Oral)  Resp 17  Ht 5\' 8"  (1.727 m)  Wt 172 lb 2.9 oz (78.1 kg)  BMI 26.18 kg/m2  SpO2 100%  Discharge Labs:  Results for orders placed during the hospital encounter of 08/26/11 (from the past 24 hour(s))  GLUCOSE, CAPILLARY     Status: Abnormal   Collection Time   08/26/11  4:37 PM      Component Value Range   Glucose-Capillary 246 (*) 70 - 99 (mg/dL)  GLUCOSE, CAPILLARY     Status: Abnormal   Collection Time   08/26/11  5:10 PM      Component Value Range   Glucose-Capillary 274 (*) 70 - 99 (mg/dL)  GC/CHLAMYDIA PROBE AMP, URINE     Status: Normal   Collection Time   08/26/11  7:38 PM      Component Value Range   GC Probe Amp, Urine NEGATIVE  NEGATIVE    Chlamydia, Swab/Urine, PCR NEGATIVE  NEGATIVE   CARDIAC PANEL(CRET KIN+CKTOT+MB+TROPI)     Status: Abnormal   Collection Time   08/26/11  8:08 PM      Component Value Range   Total CK 112  7 - 232 (U/L)   CK, MB 5.3 (*) 0.3 - 4.0 (ng/mL)   Troponin I <0.30  <0.30 (ng/mL)   Relative Index 4.7 (*) 0.0 - 2.5   GLUCOSE, CAPILLARY     Status: Abnormal   Collection Time   08/26/11  9:11 PM      Component Value Range   Glucose-Capillary 242 (*) 70 - 99 (mg/dL)   Comment 1 Notify RN    CARDIAC PANEL(CRET KIN+CKTOT+MB+TROPI)     Status: Abnormal   Collection Time   08/27/11  3:43 AM      Component Value Range   Total CK 107  7 - 232 (U/L)   CK, MB 5.0 (*) 0.3 - 4.0 (ng/mL)   Troponin I <0.30  <0.30 (ng/mL)   Relative Index 4.7 (*) 0.0 - 2.5   BASIC METABOLIC PANEL     Status: Abnormal   Collection Time   08/27/11  5:40 AM      Component Value Range   Sodium 137  135 - 145 (mEq/L)   Potassium 3.4 (*) 3.5 - 5.1 (mEq/L)   Chloride 103  96 - 112 (mEq/L)   CO2 26  19 - 32 (mEq/L)  Glucose, Bld 235 (*) 70 - 99 (mg/dL)   BUN 13  6 - 23 (mg/dL)   Creatinine, Ser 0.79  0.50 - 1.35 (mg/dL)    Calcium 8.9  8.4 - 10.5 (mg/dL)   GFR calc non Af Amer >90  >90 (mL/min)   GFR calc Af Amer >90  >90 (mL/min)  LIPID PANEL     Status: Abnormal   Collection Time   08/27/11  5:40 AM      Component Value Range   Cholesterol 121  0 - 200 (mg/dL)   Triglycerides 153 (*) <150 (mg/dL)   HDL 34 (*) >39 (mg/dL)   Total CHOL/HDL Ratio 3.6     VLDL 31  0 - 40 (mg/dL)   LDL Cholesterol 56  0 - 99 (mg/dL)  GLUCOSE, CAPILLARY     Status: Abnormal   Collection Time   08/27/11  7:35 AM      Component Value Range   Glucose-Capillary 224 (*) 70 - 99 (mg/dL)  GLUCOSE, CAPILLARY     Status: Abnormal   Collection Time   08/27/11 11:36 AM      Component Value Range   Glucose-Capillary 257 (*) 70 - 99 (mg/dL)    Signed: Jakaiya Netherland, BRAD 08/27/2011, 4:23 PM

## 2011-08-28 LAB — T-HELPER CELLS (CD4) COUNT (NOT AT ARMC): CD4 % Helper T Cell: 31 % — ABNORMAL LOW (ref 33–55)

## 2011-08-28 LAB — HIV-1 RNA ULTRAQUANT REFLEX TO GENTYP+: HIV 1 RNA Quant: 44500 copies/mL — ABNORMAL HIGH (ref <20)

## 2011-08-30 LAB — HIV 1/2 CONFIRMATION
HIV-1 antibody: POSITIVE
HIV-2 Ab: NEGATIVE

## 2011-09-08 ENCOUNTER — Encounter (HOSPITAL_COMMUNITY): Payer: Self-pay | Admitting: *Deleted

## 2011-09-08 ENCOUNTER — Emergency Department (HOSPITAL_COMMUNITY)
Admission: EM | Admit: 2011-09-08 | Discharge: 2011-09-08 | Disposition: A | Discharge status: Home / Self Care | Payer: Medicare Other | Attending: Emergency Medicine | Admitting: Emergency Medicine

## 2011-09-08 DIAGNOSIS — M7989 Other specified soft tissue disorders: Secondary | ICD-10-CM | POA: Insufficient documentation

## 2011-09-08 DIAGNOSIS — Z79899 Other long term (current) drug therapy: Secondary | ICD-10-CM | POA: Insufficient documentation

## 2011-09-08 DIAGNOSIS — Z794 Long term (current) use of insulin: Secondary | ICD-10-CM | POA: Insufficient documentation

## 2011-09-08 DIAGNOSIS — E119 Type 2 diabetes mellitus without complications: Secondary | ICD-10-CM | POA: Insufficient documentation

## 2011-09-08 DIAGNOSIS — M109 Gout, unspecified: Secondary | ICD-10-CM

## 2011-09-08 DIAGNOSIS — R002 Palpitations: Secondary | ICD-10-CM

## 2011-09-08 DIAGNOSIS — I209 Angina pectoris, unspecified: Secondary | ICD-10-CM | POA: Insufficient documentation

## 2011-09-08 DIAGNOSIS — I10 Essential (primary) hypertension: Secondary | ICD-10-CM | POA: Insufficient documentation

## 2011-09-08 LAB — GLUCOSE, CAPILLARY: Glucose-Capillary: 268 mg/dL — ABNORMAL HIGH (ref 70–99)

## 2011-09-08 MED ORDER — PREDNISONE 20 MG PO TABS: 40.0000 mg | ORAL_TABLET | Freq: Every day | ORAL | Status: AC

## 2011-09-08 MED ORDER — IBUPROFEN 400 MG PO TABS: 400.0000 mg | ORAL_TABLET | Freq: Four times a day (QID) | ORAL | Status: AC | PRN

## 2011-09-08 MED ORDER — IBUPROFEN 800 MG PO TABS
800.0000 mg | ORAL_TABLET | Freq: Once | ORAL | Status: AC
  Administered 2011-09-08: 800 mg via ORAL
  Filled 2011-09-08: qty 1

## 2011-09-08 MED ORDER — PREDNISONE 20 MG PO TABS
60.0000 mg | ORAL_TABLET | Freq: Once | ORAL | Status: AC
  Administered 2011-09-08: 60 mg via ORAL
  Filled 2011-09-08: qty 3

## 2011-09-08 NOTE — ED Notes (Signed)
Pt c/o L foot pain secondary to gout. Pt states he is short of breath x 1 day w/ "a little" cough. Pt c/o chest tightness.

## 2011-09-08 NOTE — ED Notes (Signed)
L foot pain secondary to gout exacerbation.

## 2011-09-08 NOTE — ED Notes (Signed)
Pt states that he has experienced left foot toe swelling and pain x2 days.  Pt states that the pain started in his left great toe and soon spread to the remaining toes on his left foot.  Pt is having difficulty walking due to said pain.  Pt confirms hx of same.

## 2011-09-08 NOTE — ED Provider Notes (Signed)
History    60yM with L foot pain. Gradual onset about 2d ago. Denies trauma. Hx of gout and feels similar. Pain maximally near big toe. Mild swelling of foot. No fever or chills. No n/v. Denies pain anywhere else. Has not taken anything for the pain. On ROS, c/o palpitations and occasional cough. Denies CP or SOB to me. Says these palpitations have been occuring for 2y. Recently admitted to hospital with similar complaints.  CSN: [id number] Arrival date & time: 09/08/2011  3:11 AM   First MD Initiated Contact with Patient 09/08/11 [phone]      Chief Complaint  Patient presents with  . Gout    (Consider location/radiation/quality/duration/timing/severity/associated sxs/prior treatment) HPI  Past Medical History  Diagnosis Date  . Diabetes mellitus   . Hypertension   . Angina   . Shortness of breath   . Headache   . Gout     Past Surgical History  Procedure Date  . Back surgery     History reviewed. No pertinent family history.  History  Substance Use Topics  . Smoking status: Never Smoker   . Smokeless tobacco: Not on file  . Alcohol Use: No      Review of Systems   Review of symptoms negative unless otherwise noted in HPI.   Allergies  Review of patient's allergies indicates no known allergies.  Home Medications   Current Outpatient Rx  Name Route Sig Dispense Refill  . ASPIRIN 81 MG PO TBEC Oral Take 1 tablet (81 mg total) by mouth daily. 31 tablet 0  . INSULIN GLARGINE 100 UNIT/ML [place] SOLN Subcutaneous Inject 30 Units into the skin at bedtime.     Marland Kitchen METFORMIN HCL 500 MG PO TABS Oral Take 1,000 mg by mouth 2 (two) times daily with a meal.      . PAROXETINE HCL 20 MG PO TABS Oral Take 1 tablet (20 mg total) by mouth every morning.    Marland Kitchen PRAVASTATIN SODIUM 80 MG PO TABS Oral Take 1 tablet (80 mg total) by mouth daily.    Marland Kitchen DILTIAZEM HCL ER COATED BEADS 120 MG PO CP24 Oral Take 1 capsule (120 mg total) by mouth daily.      BP 144/94  Pulse 96  Temp(Src) 98.4  F (36.9 C) (Oral)  Resp 20  SpO2 98%  Physical Exam  Nursing note and vitals reviewed. Constitutional: He appears well-developed and well-nourished. No distress.  HENT:  Head: Normocephalic and atraumatic.  Eyes: Conjunctivae are normal. Right eye exhibits no discharge. Left eye exhibits no discharge.  Neck: Neck supple.  Cardiovascular: Normal rate, regular rhythm and normal heart sounds.  Exam reveals no gallop and no friction rub.   No murmur heard. Pulmonary/Chest: Effort normal and breath sounds normal. No respiratory distress.  Abdominal: Soft. He exhibits no distension. There is no tenderness.  Musculoskeletal:       Mild diffuse swelling of L foot. Severe pain with ROM 1st toe. Tenderness and mild erythema near base of 1st digit. Skin intact without concerning lesion. Neruovascularly intact distally.   Neurological: He is alert.  Skin: Skin is warm and dry.  Psychiatric: He has a normal mood and affect. His behavior is normal. Thought content normal.    ED Course  Procedures (including critical care time)  Labs Reviewed - No data to display No results found.  EKG:  Rhythm: normal sinus with PACs Rate: 78 Axis: normal Intervals: normal ST segments: some t wave flattening inferiorly and laterally, not significantly changed from  previous comparison from 08/27/11.   1. Gout attack   2. Heart palpitations       MDM  60yM with pain in L foot. Clinically gout with pain, erythema, mild swelling at base of first MP, hx of same. No fever or chills. No hx of trauma to suggest fx of wound complication. Pt also complaining of palpitations. Pt with recent admit and eval for same . W/u at that time relatively unremarkable and given no significant change in symptoms do not feel needs further eval at this time aside from EKG. Plan steroids and NSAIDs. Given symptom duration feel that colchicine to be less effective and will defer from use at this time, especially when also  considering SE profile.        Virgel Manifold, MD 09/09/11 [phone]

## 2011-09-11 ENCOUNTER — Telehealth: Payer: Self-pay | Admitting: *Deleted

## 2011-09-11 ENCOUNTER — Ambulatory Visit: Payer: Medicare Other | Admitting: Infectious Diseases

## 2011-09-11 NOTE — Telephone Encounter (Signed)
He forgot he had an appt here today. I transferred him to there front to re-schedule

## 2011-09-13 LAB — HIV-1 GENOTYPR PLUS

## 2011-09-16 ENCOUNTER — Ambulatory Visit: Payer: Medicare Other | Admitting: Infectious Diseases

## 2011-09-17 ENCOUNTER — Telehealth: Payer: Self-pay | Admitting: *Deleted

## 2011-09-17 NOTE — Telephone Encounter (Signed)
Patient missed his new patient appointment called and had him rescheduled for 09/23/11. Myrtis Hopping CMA

## 2011-09-23 ENCOUNTER — Ambulatory Visit: Payer: Medicare Other | Admitting: Infectious Diseases

## 2011-09-24 ENCOUNTER — Emergency Department (HOSPITAL_COMMUNITY)
Admission: EM | Admit: 2011-09-24 | Discharge: 2011-09-24 | Disposition: A | Discharge status: Home / Self Care | Payer: Medicare Other | Attending: Emergency Medicine | Admitting: Emergency Medicine

## 2011-09-24 ENCOUNTER — Other Ambulatory Visit: Payer: Self-pay

## 2011-09-24 ENCOUNTER — Emergency Department (HOSPITAL_COMMUNITY): Payer: Medicare Other

## 2011-09-24 ENCOUNTER — Encounter (HOSPITAL_COMMUNITY): Payer: Self-pay | Admitting: *Deleted

## 2011-09-24 DIAGNOSIS — R079 Chest pain, unspecified: Secondary | ICD-10-CM | POA: Insufficient documentation

## 2011-09-24 DIAGNOSIS — I10 Essential (primary) hypertension: Secondary | ICD-10-CM | POA: Insufficient documentation

## 2011-09-24 DIAGNOSIS — Z794 Long term (current) use of insulin: Secondary | ICD-10-CM | POA: Insufficient documentation

## 2011-09-24 DIAGNOSIS — E1169 Type 2 diabetes mellitus with other specified complication: Secondary | ICD-10-CM | POA: Insufficient documentation

## 2011-09-24 DIAGNOSIS — R059 Cough, unspecified: Secondary | ICD-10-CM | POA: Insufficient documentation

## 2011-09-24 DIAGNOSIS — R0602 Shortness of breath: Secondary | ICD-10-CM | POA: Insufficient documentation

## 2011-09-24 DIAGNOSIS — R002 Palpitations: Secondary | ICD-10-CM | POA: Insufficient documentation

## 2011-09-24 DIAGNOSIS — R05 Cough: Secondary | ICD-10-CM

## 2011-09-24 DIAGNOSIS — R739 Hyperglycemia, unspecified: Secondary | ICD-10-CM

## 2011-09-24 LAB — GLUCOSE, CAPILLARY
Glucose-Capillary: 284 mg/dL — ABNORMAL HIGH (ref 70–99)
Glucose-Capillary: 312 mg/dL — ABNORMAL HIGH (ref 70–99)
Glucose-Capillary: 278 mg/dL — ABNORMAL HIGH (ref 70–99)

## 2011-09-24 LAB — CBC
HCT: 38 % — ABNORMAL LOW (ref 39.0–52.0)
Hemoglobin: 13.5 g/dL (ref 13.0–17.0)
MCHC: 35.5 g/dL (ref 30.0–36.0)
RDW: 12.8 % (ref 11.5–15.5)
WBC: 6.4 10*3/uL (ref 4.0–10.5)

## 2011-09-24 LAB — DIFFERENTIAL
Basophils Absolute: 0 10*3/uL (ref 0.0–0.1)
Basophils Relative: 0 % (ref 0–1)
Lymphocytes Relative: 24 % (ref 12–46)
Monocytes Absolute: 0.5 10*3/uL (ref 0.1–1.0)
Monocytes Relative: 7 % (ref 3–12)
Neutro Abs: 4.3 10*3/uL (ref 1.7–7.7)
Neutrophils Relative %: 67 % (ref 43–77)

## 2011-09-24 LAB — POCT I-STAT, CHEM 8
BUN: 16 mg/dL (ref 6–23)
Calcium, Ion: 1.12 mmol/L (ref 1.12–1.32)
Chloride: 102 mEq/L (ref 96–112)
HCT: 42 % (ref 39.0–52.0)
Potassium: 3.6 mEq/L (ref 3.5–5.1)

## 2011-09-24 LAB — TROPONIN I: Troponin I: <0.3 ng/mL (ref <0.30)

## 2011-09-24 LAB — D-DIMER, QUANTITATIVE: D-Dimer, Quant: 0.37 ug/mL-FEU (ref 0.00–0.48)

## 2011-09-24 LAB — POCT I-STAT TROPONIN I: Troponin i, poc: 0 ng/mL (ref 0.00–0.08)

## 2011-09-24 MED ORDER — INSULIN ASPART 100 UNIT/ML ~~LOC~~ SOLN: 6.0000 [IU] | Freq: Once | SUBCUTANEOUS | Status: DC

## 2011-09-24 MED ORDER — INSULIN REGULAR HUMAN 100 UNIT/ML IJ SOLN: 6.0000 [IU] | Freq: Once | INTRAMUSCULAR | Status: DC

## 2011-09-24 MED ORDER — HYDROCOD POLST-CHLORPHEN POLST 10-8 MG/5ML PO LQCR
5.0000 mL | Freq: Once | ORAL | Status: AC
  Administered 2011-09-24: 5 mL via ORAL
  Filled 2011-09-24: qty 5

## 2011-09-24 MED ORDER — HYDROCODONE-HOMATROPINE 5-1.5 MG/5ML PO SYRP: 5.0000 mL | ORAL_SOLUTION | Freq: Four times a day (QID) | ORAL | Status: AC | PRN

## 2011-09-24 MED ORDER — INSULIN REGULAR HUMAN 100 UNIT/ML IJ SOLN: 8.0000 [IU] | Freq: Once | INTRAMUSCULAR | Status: DC

## 2011-09-24 MED ORDER — INSULIN ASPART 100 UNIT/ML ~~LOC~~ SOLN
8.0000 [IU] | Freq: Once | SUBCUTANEOUS | Status: AC
  Administered 2011-09-24: 8 [IU] via SUBCUTANEOUS

## 2011-09-24 MED ORDER — SODIUM CHLORIDE 0.9 % IV BOLUS (SEPSIS)
1000.0000 mL | Freq: Once | INTRAVENOUS | Status: AC
  Administered 2011-09-24: 1000 mL via INTRAVENOUS

## 2011-09-24 NOTE — ED Notes (Signed)
Pt is alert and resting.  Denies chest pain at present and has been experiencing this discomfort over a while.  Pt is sr-st90-100 with occasional PACs.  No edema.  Alert and oriented and awaiting d-dimer results
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
## 2011-09-24 NOTE — ED Notes (Signed)
BREAKFAST ORDERED FOR PT

## 2011-09-24 NOTE — ED Notes (Addendum)
Pt resting. Awaiting further orders.

## 2011-09-24 NOTE — ED Notes (Addendum)
C/o palpitations, heart racing & sob. H/o same, comes and goes. Constant for last 2 weeks, worse tonight. Also light headed, dizzy, nausea, weak. Alert, appears anxious, hyperventilating. coughing noted in triage.

## 2011-09-24 NOTE — ED Provider Notes (Signed)
History     CSN: [id number] Arrival date & time: 09/24/2011  2:54 AM   First MD Initiated Contact with Patient 09/24/11 [phone]      Chief Complaint  Patient presents with  . Palpitations    comes and goes, but constant for last 2 weeks, worse tonight.    (Consider location/radiation/quality/duration/timing/severity/associated sxs/prior treatment) Patient is a 60 y.o. male presenting with chest pain. The history is provided by the patient.  Chest Pain The chest pain began 1 - 2 weeks ago. Chest pain occurs intermittently. The chest pain is worsening. The pain is associated with breathing and coughing. At its most intense, the pain is at 7/10. The quality of the pain is described as aching and pleuritic. Chest pain is worsened by deep breathing. Primary symptoms include shortness of breath, cough and palpitations. Pertinent negatives for primary symptoms include no fever, no abdominal pain, no nausea, no vomiting and no dizziness.  The palpitations also occurred with shortness of breath. The palpitations did not occur with syncope or dizziness.   Pertinent negatives for associated symptoms include no diaphoresis, no orthopnea and no weakness.   Pt reports cough and intermittent chest pain for two weeks. States pain is worse with coughing, and deep breathing, but also occurs at random. Denies chest pain worsening on exertion. States was seen here for the same a month ago, followed up with his doctor, states was started on diltiazem. States at times feels like his heart is racing. Denies fever, chills, headache, back pain, abdominal pain.  Past Medical History  Diagnosis Date  . Diabetes mellitus   . Hypertension   . Angina   . Shortness of breath   . Headache   . Gout     Past Surgical History  Procedure Date  . Back surgery     History reviewed. No pertinent family history.  History  Substance Use Topics  . Smoking status: Never Smoker   . Smokeless tobacco: Not on file  .  Alcohol Use: No      Review of Systems  Constitutional: Negative for fever, chills and diaphoresis.  HENT: Negative.   Eyes: Negative.   Respiratory: Positive for cough and shortness of breath.   Cardiovascular: Positive for chest pain and palpitations. Negative for orthopnea.  Gastrointestinal: Negative for nausea, vomiting and abdominal pain.  Genitourinary: Negative.   Musculoskeletal: Negative.   Skin: Negative.   Neurological: Negative for dizziness, syncope, weakness and light-headedness.  Psychiatric/Behavioral: Negative.     Allergies  Review of patient's allergies indicates no known allergies.  Home Medications   Current Outpatient Rx  Name Route Sig Dispense Refill  . ASPIRIN 81 MG PO TBEC Oral Take 1 tablet (81 mg total) by mouth daily. 31 tablet 0  . DILTIAZEM HCL ER BEADS 240 MG PO CP24 Oral Take 240 mg by mouth daily.      Marland Kitchen FLUTICASONE FUROATE 27.5 MCG/SPRAY NA SUSP Nasal Place 2 sprays into the nose daily.      Marland Kitchen FLUTICASONE-SALMETEROL 500-50 MCG/DOSE IN AEPB Inhalation Inhale 1 puff into the lungs every 12 (twelve) hours.      Marland Kitchen GABAPENTIN 300 MG PO CAPS Oral Take 300 mg by mouth 3 (three) times daily.      Marland Kitchen GLIMEPIRIDE 2 MG PO TABS Oral Take 2 mg by mouth 3 (three) times daily.      . INSULIN GLARGINE 100 UNIT/ML [place] SOLN Subcutaneous Inject 30 Units into the skin at bedtime.     Marland Kitchen METFORMIN HCL  500 MG PO TABS Oral Take 1,000 mg by mouth 2 (two) times daily with a meal.      . METOPROLOL SUCCINATE ER 25 MG PO TB24 Oral Take 25 mg by mouth daily.      Marland Kitchen OVER THE COUNTER MEDICATION Oral Take 1-2 tablets by mouth every 4 (four) hours as needed. Acetaminophen, guaifenesin, phenylephrine  For cough and cold     . PAROXETINE HCL 20 MG PO TABS Oral Take 1 tablet (20 mg total) by mouth every morning.    Marland Kitchen PRAVASTATIN SODIUM 80 MG PO TABS Oral Take 1 tablet (80 mg total) by mouth daily.    Marland Kitchen PREDNISONE 20 MG PO TABS Oral Take 40 mg by mouth daily.      . IBUPROFEN 400  MG PO TABS Oral Take 400 mg by mouth every 6 (six) hours as needed. For pain       BP 111/60  Pulse 77  Temp(Src) 97.8 F (36.6 C) (Oral)  Resp 20  SpO2 99%  Physical Exam  Constitutional: He is oriented to person, place, and time. He appears well-developed and well-nourished. No distress.  HENT:  Head: Normocephalic and atraumatic.  Eyes: Conjunctivae are normal.  Neck: Neck supple.  Cardiovascular: Normal rate, regular rhythm and normal heart sounds.   Pulmonary/Chest: Effort normal and breath sounds normal. No respiratory distress. He has no wheezes. He has no rales.  Abdominal: Soft. Bowel sounds are normal. He exhibits no distension.  Musculoskeletal: Normal range of motion. He exhibits no edema and no tenderness.  Lymphadenopathy:    He has no cervical adenopathy.  Neurological: He is alert and oriented to person, place, and time.  Skin: Skin is warm and dry. He is not diaphoretic. No erythema.    ED Course  Procedures (including critical care time)  Labs Reviewed  CBC - Abnormal; Notable for the following:    HCT 38.0 (*)    MCV 76.9 (*)    All other components within normal limits  POCT I-STAT, CHEM 8 - Abnormal; Notable for the following:    Glucose, Bld 338 (*)    All other components within normal limits  DIFFERENTIAL  POCT I-STAT TROPONIN I  D-DIMER, QUANTITATIVE  I-STAT, CHEM 8  I-STAT TROPONIN I   Dg Chest 2 View  09/24/2011  *RADIOLOGY REPORT*  Clinical Data: Shortness of breath, left-sided chest pain.  CHEST - 2 VIEW  Comparison: 08/26/2011  Findings: Lungs are clear. No pleural effusion or pneumothorax. The cardiomediastinal contours are within normal limits. The visualized bones and soft tissues are without significant appreciable abnormality.  IMPRESSION: No acute cardiopulmonary process.  Original Report Authenticated By: Suanne Marker, M.D.     Pt with atypical CP for a month, cough. ECG, two sets of enzymes negative, CXR negative, D dimer  negative. VS normal. Will d/c home with close follow up. I suspect CP related to pt's cough, since it is worsened with coughing. WIll give cough medications.    Date: 09/24/2011  Rate: 110  Rhythm: sinus tachycardia and premature atrial contractions (PAC)  QRS Axis: normal  Intervals: normal  ST/T Wave abnormalities: normal  Conduction Disutrbances:none  Narrative Interpretation:   Old EKG Reviewed: unchanged   MDM   Pt with non exertional chest pain, cough, cold symptoms. Work up negative. Two sets of enzymes 6 hrs apart negative, CXR negative, d dimer negative.  I think pt is stable to be d/c home with outpatient follow up.     Lamari Youngers A  [place], [place] 09/24/11 1242

## 2011-09-24 NOTE — ED Notes (Signed)
PA AWARE OF PT TROPONIN RESULT

## 2011-09-24 NOTE — ED Notes (Signed)
CBG was obtained prior to insulin administrtion; timed when docked by

## 2011-09-24 NOTE — ED Provider Notes (Signed)
Medical screening examination/treatment/procedure(s) were performed by non-physician practitioner and as supervising physician I was immediately available for consultation/collaboration.   Wynetta Fines, MD 09/24/11 2308

## 2011-09-26 ENCOUNTER — Ambulatory Visit: Payer: Medicare Other | Admitting: Infectious Diseases

## 2011-09-26 ENCOUNTER — Telehealth: Payer: Self-pay | Admitting: *Deleted

## 2011-09-26 NOTE — Telephone Encounter (Signed)
Patient referred to [facility] for 2 no show new patient appointments Myrtis Hopping CMA

## 2011-10-18 ENCOUNTER — Ambulatory Visit (INDEPENDENT_AMBULATORY_CARE_PROVIDER_SITE_OTHER): Payer: Medicare Other | Admitting: Infectious Diseases

## 2011-10-18 ENCOUNTER — Encounter: Payer: Self-pay | Admitting: Infectious Diseases

## 2011-10-18 VITALS — BP 128/88 | HR 96 | Temp 97.6°F | Ht 67.0 in | Wt 166.0 lb

## 2011-10-18 DIAGNOSIS — Z21 Asymptomatic human immunodeficiency virus [HIV] infection status: Secondary | ICD-10-CM

## 2011-10-18 DIAGNOSIS — Z113 Encounter for screening for infections with a predominantly sexual mode of transmission: Secondary | ICD-10-CM

## 2011-10-18 DIAGNOSIS — Z23 Encounter for immunization: Secondary | ICD-10-CM

## 2011-10-18 DIAGNOSIS — B20 Human immunodeficiency virus [HIV] disease: Secondary | ICD-10-CM

## 2011-10-18 DIAGNOSIS — R079 Chest pain, unspecified: Secondary | ICD-10-CM

## 2011-10-18 DIAGNOSIS — Z79899 Other long term (current) drug therapy: Secondary | ICD-10-CM

## 2011-10-18 DIAGNOSIS — E119 Type 2 diabetes mellitus without complications: Secondary | ICD-10-CM

## 2011-10-18 LAB — CBC WITH DIFFERENTIAL/PLATELET
Basophils Absolute: 0 10*3/uL (ref 0.0–0.1)
Eosinophils Relative: 5 % (ref 0–5)
Lymphocytes Relative: 31 % (ref 12–46)
Lymphs Abs: 1.2 10*3/uL (ref 0.7–4.0)
Neutro Abs: 2.3 10*3/uL (ref 1.7–7.7)
Neutrophils Relative %: 58 % (ref 43–77)
Platelets: 179 10*3/uL (ref 150–400)
RBC: 5.26 MIL/uL (ref 4.22–5.81)
RDW: 13.3 % (ref 11.5–15.5)
WBC: 3.9 10*3/uL — ABNORMAL LOW (ref 4.0–10.5)

## 2011-10-18 LAB — COMPREHENSIVE METABOLIC PANEL
ALT: 14 U/L (ref 0–53)
AST: 14 U/L (ref 0–37)
Alkaline Phosphatase: 77 U/L (ref 39–117)
BUN: 22 mg/dL (ref 6–23)
Calcium: 9.1 mg/dL (ref 8.4–10.5)
Chloride: 102 mEq/L (ref 96–112)
Creat: 1.07 mg/dL (ref 0.50–1.35)
Potassium: 4.2 mEq/L (ref 3.5–5.3)

## 2011-10-18 LAB — RPR

## 2011-10-18 LAB — LIPID PANEL
HDL: 45 mg/dL (ref >39)
LDL Cholesterol: 127 mg/dL — ABNORMAL HIGH (ref 0–99)
Total CHOL/HDL Ratio: 4.6 Ratio

## 2011-10-18 LAB — T-HELPER CELL (CD4) - (RCID CLINIC ONLY): CD4 % Helper T Cell: 32 % — ABNORMAL LOW (ref 33–55)

## 2011-10-18 LAB — HEPATITIS B SURFACE ANTIBODY,QUALITATIVE: Hep B S Ab: NEGATIVE

## 2011-10-18 NOTE — Assessment & Plan Note (Signed)
61 year old male with newly diagnosed HIV infection as of November 2012. He would be an excellent candidate for complera. He has an intact  CD4 count with a relatively low viral load. He has expressed an interest in being evaluated by the [facility]. He may be a candidate for the bone density study (Darunavir, Maraviroc based). He is given condoms today although he is not sexually active. we will check his baseline laboratories. He will be evaluated for a nurse intake. His vaccines are up to date (although he needs screening for Hep A/B/C). He'll return to the infectious disease clinic in 1 month.

## 2011-10-18 NOTE — Progress Notes (Signed)
  Subjective:    Patient ID: Michael [name], male    DOB: 12-Mar-1951, 61 y.o.   MRN: [id number]  HPI 61 yo M with newly Dx HIV+ (CD4 350 VL 44,500 November 2012). Was hospitalized in November with chest pain and SOB. This was felt to be secondary to bronchitis. This has been unchanged since d/c home. Has a hx of DM as well (since 2002).  Has crack cocaine several months ago. Not sexually active for 3-4 years, divorced. FSG 90 to >300   Review of Systems  Constitutional: Positive for unexpected weight change. Negative for fever, chills and appetite change.  Respiratory: Positive for cough and chest tightness.   Cardiovascular: Positive for chest pain. Negative for leg swelling.  Gastrointestinal: Positive for constipation. Negative for diarrhea.  Genitourinary: Negative for dysuria.  Hematological: Negative for adenopathy. Does not bruise/bleed easily.  has had blurry vision, last ophtho > 1 year. Had colonoscopy (?2009)     Objective:   Physical Exam  Constitutional: He appears well-developed and well-nourished.  HENT:  Head: Normocephalic.  Mouth/Throat: No oropharyngeal exudate.  Eyes: EOM are normal. Pupils are equal, round, and reactive to light.  Neck: Neck supple.  Cardiovascular: Normal rate, regular rhythm and normal heart sounds.   Pulses:      Posterior tibial pulses are 3+ on the right side, and 3+ on the left side.  Pulmonary/Chest: Breath sounds normal. He exhibits no tenderness.  Abdominal: Soft. Bowel sounds are normal. There is no tenderness.  Musculoskeletal: He exhibits no edema.  Lymphadenopathy:    He has no cervical adenopathy.  Neurological: No sensory deficit.  Skin:          No diabetic foot lesions          Assessment & Plan:

## 2011-10-18 NOTE — Assessment & Plan Note (Addendum)
Will have him set up with PCP for this as well as his ? Chest pain. Refer him to ophtho

## 2011-10-18 NOTE — Assessment & Plan Note (Signed)
Will have him eval by CV for stress test.

## 2011-10-19 LAB — HEPATITIS B CORE ANTIBODY, TOTAL: Hep B Core Total Ab: NEGATIVE

## 2011-10-19 LAB — GC/CHLAMYDIA PROBE AMP, URINE
Chlamydia, Swab/Urine, PCR: NEGATIVE
GC Probe Amp, Urine: NEGATIVE

## 2011-10-19 LAB — HEPATITIS A ANTIBODY, TOTAL: Hep A Total Ab: NEGATIVE

## 2011-10-21 ENCOUNTER — Encounter: Payer: Self-pay | Admitting: *Deleted

## 2011-10-21 ENCOUNTER — Telehealth: Payer: Self-pay | Admitting: *Deleted

## 2011-10-21 ENCOUNTER — Ambulatory Visit (INDEPENDENT_AMBULATORY_CARE_PROVIDER_SITE_OTHER): Payer: Medicare Other | Admitting: *Deleted

## 2011-10-21 VITALS — BP 120/80 | HR 81 | Temp 98.4°F | Resp 20 | Ht 67.5 in | Wt 167.2 lb

## 2011-10-21 DIAGNOSIS — B20 Human immunodeficiency virus [HIV] disease: Secondary | ICD-10-CM

## 2011-10-21 NOTE — Telephone Encounter (Signed)
Called [facility] at [phone] to set up appt for patient and was faxed a form. Filled out the form and faxed it back with information and they will notify us of the appt and the patient when it is set. Will call the patient to advise him of this and to expect a call from the office.

## 2011-10-21 NOTE — Progress Notes (Signed)
Patient screened today for study A5303. Informed consent was obtained after the he read over the consent and we discussed it in detail. All his questions were answered. He understood that if he is eligible  and enrolled on study , he will be enrolled to 1 of 2 treatment arms  (Darunavir, Ritonavir, Emtriva, Maraviroc or Tenofovir). A trofile was obtained today to see if he is CCR5 tropic. I reviewed his health history with him and will obtain records from his PCP to get a more accurate assessment. He is unsure of what meds he is currently taking and I asked him to bring them with him the next time he comes in. He denies using any advair or nasal inhalers since he was hospitalized in November. We discussed his diabetes management and I recommended that he see the diabetes treatment coordinator for further education. His blood sugars were elevated in the hospital and continue to be in the 200-300 range since then. He says he has been having intermittent left chest pressure for the past 6 months and was in the hospital for that in November. Cardio was ruled out but he was supposed to see a cardiologist for follow up which wasn't done. He said he had a significant hx of asthma as a child and does at times have chest tightness, cough and SOB, but is on no treatment for asthma. Vision is blurred and an opthalmology referral was being made. He was referred to [facility] for help with resources, of which he says transportation is the greatest need. His brother has been bringing him to appointments but he will be leaving town soon. Once his trofile returns, we will determine if he is eligible for study.

## 2011-10-21 NOTE — Telephone Encounter (Signed)
Called Michael [name] and made the patient a DM eye appointment as per provider request. Made the patient appointment for Thursday, February 28 at 10:15 am. They will send the patient an information packet with address and appt time. I will also call the patient to let him know it is on the way and that the appt has been made. Office info [facility], [address], [address], [place].

## 2011-10-22 LAB — HIV-1 RNA ULTRAQUANT REFLEX TO GENTYP+: HIV 1 RNA Quant: 62586 copies/mL — ABNORMAL HIGH (ref <20)

## 2011-10-26 LAB — HLA B*5701

## 2011-10-29 ENCOUNTER — Telehealth: Payer: Self-pay | Admitting: *Deleted

## 2011-10-29 NOTE — Telephone Encounter (Signed)
Cindy Hazy, RN from [facility] (fax [phone] a faxed & signed release of records to [place] asking for the last office visit notes. I faxed them over

## 2011-11-07 ENCOUNTER — Other Ambulatory Visit: Payer: Self-pay | Admitting: *Deleted

## 2011-11-07 ENCOUNTER — Encounter: Payer: Self-pay | Admitting: *Deleted

## 2011-11-07 ENCOUNTER — Telehealth: Payer: Self-pay | Admitting: *Deleted

## 2011-11-07 DIAGNOSIS — B20 Human immunodeficiency virus [HIV] disease: Secondary | ICD-10-CM

## 2011-11-07 MED ORDER — EMTRICITAB-RILPIVIR-TENOFOV DF 200-25-300 MG PO TABS: 1.0000 | ORAL_TABLET | Freq: Every day | ORAL | Status: DC

## 2011-11-07 NOTE — Telephone Encounter (Signed)
Left patient a message to return my call.  He has an appointment with [facility] for 11/29/11 at 2:30 pm with Dr. Ellyn Hack.  This is for follow up on his exercise stress test which recommended a cardiology consult. Myrtis Hopping CMA

## 2011-11-07 NOTE — Progress Notes (Signed)
Received patient's trofile today from [facility]. He has tropotye DM and is not eligible for study A5293. I will contact him and let him know he needs to come back in and see [name] to get started on meds.

## 2011-11-07 NOTE — Telephone Encounter (Signed)
Patient notified of Cardiology appointment.  Also informed him he is not a candidate for the study and I spoke to [name] and he wants to start him on Complera, will send to [facility], but he needs to come in and see Barb to sign up for SPAP for help with obtaining this medication. Myrtis Hopping CMA

## 2011-11-11 ENCOUNTER — Telehealth: Payer: Self-pay

## 2011-11-11 NOTE — Telephone Encounter (Signed)
Called patient to make appt for ADAP - he has already seen Myriam Jacobson at [facility] - called Myriam Jacobson and he is coming in tomorrow to get Dr signature and labs, etc - patient does not need appt with me -

## 2011-11-12 ENCOUNTER — Other Ambulatory Visit: Payer: Self-pay | Admitting: *Deleted

## 2011-11-12 DIAGNOSIS — B20 Human immunodeficiency virus [HIV] disease: Secondary | ICD-10-CM

## 2011-11-12 MED ORDER — EMTRICITAB-RILPIVIR-TENOFOV DF 200-25-300 MG PO TABS: 1.0000 | ORAL_TABLET | Freq: Every day | ORAL | Status: DC

## 2011-11-17 ENCOUNTER — Emergency Department (HOSPITAL_COMMUNITY): Payer: Medicare Other

## 2011-11-17 ENCOUNTER — Emergency Department (HOSPITAL_COMMUNITY)
Admission: EM | Admit: 2011-11-17 | Discharge: 2011-11-17 | Disposition: A | Discharge status: Home / Self Care | Payer: Medicare Other | Attending: Emergency Medicine | Admitting: Emergency Medicine

## 2011-11-17 ENCOUNTER — Encounter (HOSPITAL_COMMUNITY): Payer: Self-pay | Admitting: Adult Health

## 2011-11-17 DIAGNOSIS — Z79899 Other long term (current) drug therapy: Secondary | ICD-10-CM | POA: Insufficient documentation

## 2011-11-17 DIAGNOSIS — R509 Fever, unspecified: Secondary | ICD-10-CM | POA: Insufficient documentation

## 2011-11-17 DIAGNOSIS — Z8639 Personal history of other endocrine, nutritional and metabolic disease: Secondary | ICD-10-CM | POA: Insufficient documentation

## 2011-11-17 DIAGNOSIS — Z862 Personal history of diseases of the blood and blood-forming organs and certain disorders involving the immune mechanism: Secondary | ICD-10-CM | POA: Insufficient documentation

## 2011-11-17 DIAGNOSIS — R300 Dysuria: Secondary | ICD-10-CM | POA: Insufficient documentation

## 2011-11-17 DIAGNOSIS — R110 Nausea: Secondary | ICD-10-CM | POA: Insufficient documentation

## 2011-11-17 DIAGNOSIS — N390 Urinary tract infection, site not specified: Secondary | ICD-10-CM | POA: Insufficient documentation

## 2011-11-17 DIAGNOSIS — R109 Unspecified abdominal pain: Secondary | ICD-10-CM | POA: Insufficient documentation

## 2011-11-17 DIAGNOSIS — Z21 Asymptomatic human immunodeficiency virus [HIV] infection status: Secondary | ICD-10-CM | POA: Insufficient documentation

## 2011-11-17 DIAGNOSIS — Z794 Long term (current) use of insulin: Secondary | ICD-10-CM | POA: Insufficient documentation

## 2011-11-17 DIAGNOSIS — E119 Type 2 diabetes mellitus without complications: Secondary | ICD-10-CM | POA: Insufficient documentation

## 2011-11-17 DIAGNOSIS — R5381 Other malaise: Secondary | ICD-10-CM | POA: Insufficient documentation

## 2011-11-17 DIAGNOSIS — R350 Frequency of micturition: Secondary | ICD-10-CM | POA: Insufficient documentation

## 2011-11-17 DIAGNOSIS — R5383 Other fatigue: Secondary | ICD-10-CM | POA: Insufficient documentation

## 2011-11-17 DIAGNOSIS — M545 Low back pain, unspecified: Secondary | ICD-10-CM | POA: Insufficient documentation

## 2011-11-17 DIAGNOSIS — I10 Essential (primary) hypertension: Secondary | ICD-10-CM | POA: Insufficient documentation

## 2011-11-17 DIAGNOSIS — IMO0001 Reserved for inherently not codable concepts without codable children: Secondary | ICD-10-CM | POA: Insufficient documentation

## 2011-11-17 DIAGNOSIS — E785 Hyperlipidemia, unspecified: Secondary | ICD-10-CM | POA: Insufficient documentation

## 2011-11-17 LAB — COMPREHENSIVE METABOLIC PANEL
CO2: 25 mEq/L (ref 19–32)
Calcium: 9.3 mg/dL (ref 8.4–10.5)
Creatinine, Ser: 0.77 mg/dL (ref 0.50–1.35)
GFR calc Af Amer: >90 mL/min (ref >90)
GFR calc non Af Amer: >90 mL/min (ref >90)
Glucose, Bld: 120 mg/dL — ABNORMAL HIGH (ref 70–99)

## 2011-11-17 LAB — URINALYSIS, ROUTINE W REFLEX MICROSCOPIC
Bilirubin Urine: NEGATIVE
Nitrite: NEGATIVE
Specific Gravity, Urine: 1.025 (ref 1.005–1.030)
Urobilinogen, UA: 0.2 mg/dL (ref 0.0–1.0)
pH: 5.5 (ref 5.0–8.0)

## 2011-11-17 LAB — DIFFERENTIAL
Basophils Absolute: 0 10*3/uL (ref 0.0–0.1)
Eosinophils Absolute: 0.2 10*3/uL (ref 0.0–0.7)
Eosinophils Relative: 5 % (ref 0–5)
Lymphocytes Relative: 32 % (ref 12–46)
Lymphs Abs: 1.6 10*3/uL (ref 0.7–4.0)
Monocytes Absolute: 0.3 10*3/uL (ref 0.1–1.0)

## 2011-11-17 LAB — URINE MICROSCOPIC-ADD ON

## 2011-11-17 LAB — LIPASE, BLOOD: Lipase: 39 U/L (ref 11–59)

## 2011-11-17 LAB — CBC
HCT: 38.4 % — ABNORMAL LOW (ref 39.0–52.0)
MCH: 26.5 pg (ref 26.0–34.0)
MCV: 77.6 fL — ABNORMAL LOW (ref 78.0–100.0)
RDW: 13.7 % (ref 11.5–15.5)
WBC: 5 10*3/uL (ref 4.0–10.5)

## 2011-11-17 MED ORDER — HYDROCODONE-ACETAMINOPHEN 5-325 MG PO TABS
1.0000 | ORAL_TABLET | Freq: Once | ORAL | Status: AC
  Administered 2011-11-17: 1 via ORAL
  Filled 2011-11-17: qty 1

## 2011-11-17 MED ORDER — CIPROFLOXACIN HCL 500 MG PO TABS: 500.0000 mg | ORAL_TABLET | Freq: Two times a day (BID) | ORAL | Status: AC

## 2011-11-17 MED ORDER — ONDANSETRON 8 MG PO TBDP: 8.0000 mg | ORAL_TABLET | Freq: Three times a day (TID) | ORAL | Status: AC | PRN

## 2011-11-17 NOTE — ED Provider Notes (Signed)
History     CSN: [id number]  Arrival date & time 11/17/11  1416   First MD Initiated Contact with Patient 11/17/11 1726     6:05 PM HPI Patient reports he will severe body pains, chills, abdominal pain, nausea. Also reports associated with dysuria and urinary frequency. Also complaining of mild low back pain. States he feels like he has the flu. Unsure if he has a fever.  Patient is a 61 y.o. male presenting with flu symptoms. The history is provided by the patient.  Influenza This is a new problem. The current episode started today. The problem has been unchanged. Associated symptoms include abdominal pain, chills, fatigue, a fever, myalgias and urinary symptoms. Pertinent negatives include no chest pain, congestion, coughing, headaches, nausea, neck pain, numbness, rash, sore throat, swollen glands or vomiting. He has tried nothing for the symptoms.    Past Medical History  Diagnosis Date  . Diabetes mellitus   . Hypertension   . Angina   . Shortness of breath   . Headache   . Gout   . Hyperlipidemia   . HIV infection     Past Surgical History  Procedure Date  . Back surgery     Family History  Problem Relation Age of Onset  . Diabetes Mother   . Diabetes Father   . Diabetes Brother     History  Substance Use Topics  . Smoking status: Never Smoker   . Smokeless tobacco: Never Used  . Alcohol Use: No      Review of Systems  Constitutional: Positive for fever, chills and fatigue.  HENT: Negative for ear pain, congestion, sore throat, rhinorrhea, neck pain and neck stiffness.   Respiratory: Negative for cough.   Cardiovascular: Negative for chest pain.  Gastrointestinal: Positive for abdominal pain. Negative for nausea, vomiting and diarrhea.  Genitourinary: Positive for dysuria and frequency. Negative for urgency, hematuria, discharge, penile pain and testicular pain.  Musculoskeletal: Positive for myalgias. Negative for back pain.  Skin: Negative for rash.    Neurological: Negative for dizziness, numbness and headaches.  All other systems reviewed and are negative.    Allergies  Review of patient's allergies indicates no known allergies.  Home Medications   Current Outpatient Rx  Name Route Sig Dispense Refill  . ASPIRIN 81 MG PO TBEC Oral Take 1 tablet (81 mg total) by mouth daily. 31 tablet 0  . DILTIAZEM HCL ER BEADS 240 MG PO CP24 Oral Take 240 mg by mouth daily.      Marland Kitchen EMTRICITAB-RILPIVIR-TENOFOVIR 200-25-300 MG PO TABS Oral Take 1 tablet by mouth daily. 30 tablet 11  . FLUTICASONE FUROATE 27.5 MCG/SPRAY NA SUSP Nasal Place 2 sprays into the nose daily.      Marland Kitchen FLUTICASONE-SALMETEROL 500-50 MCG/DOSE IN AEPB Inhalation Inhale 1 puff into the lungs every 12 (twelve) hours.      Marland Kitchen GABAPENTIN 300 MG PO CAPS Oral Take 300 mg by mouth 3 (three) times daily.      Marland Kitchen GLIMEPIRIDE 2 MG PO TABS Oral Take 2 mg by mouth 3 (three) times daily.      . IBUPROFEN 400 MG PO TABS Oral Take 400 mg by mouth every 6 (six) hours as needed. For pain     . INSULIN GLARGINE 100 UNIT/ML [place] SOLN Subcutaneous Inject 30 Units into the skin at bedtime.     Marland Kitchen METFORMIN HCL 500 MG PO TABS Oral Take 1,000 mg by mouth 2 (two) times daily with a meal.      .  METOPROLOL SUCCINATE ER 25 MG PO TB24 Oral Take 25 mg by mouth daily.      Marland Kitchen OVER THE COUNTER MEDICATION Oral Take 1-2 tablets by mouth every 4 (four) hours as needed. Acetaminophen, guaifenesin, phenylephrine  For cough and cold     . PAROXETINE HCL 20 MG PO TABS Oral Take 1 tablet (20 mg total) by mouth every morning.    Marland Kitchen PRAVASTATIN SODIUM 80 MG PO TABS Oral Take 1 tablet (80 mg total) by mouth daily.      BP 129/80  Pulse 85  Temp(Src) 98.4 F (36.9 C) (Oral)  Resp 16  SpO2 100%  Physical Exam  Vitals reviewed. Constitutional: He is oriented to person, place, and time. He appears well-developed and well-nourished.  HENT:  Head: Normocephalic and atraumatic.  Right Ear: External ear normal.  Left Ear:  External ear normal.  Mouth/Throat: Oropharynx is clear and moist.  Eyes: Conjunctivae are normal. Pupils are equal, round, and reactive to light.  Neck: Normal range of motion. Neck supple.  Cardiovascular: Normal rate, regular rhythm and normal heart sounds.  Exam reveals no gallop and no friction rub.   No murmur heard. Pulmonary/Chest: Effort normal and breath sounds normal. He has no wheezes. He has no rales. He exhibits no tenderness.  Abdominal: Soft. Bowel sounds are normal. He exhibits no distension and no mass. There is no hepatosplenomegaly. There is no tenderness. There is no rebound, no guarding, no CVA tenderness, no tenderness at McBurney's point and negative Murphy's sign.  Lymphadenopathy:    He has no cervical adenopathy.  Neurological: He is alert and oriented to person, place, and time.  Skin: Skin is warm and dry. No rash noted. No erythema. No pallor.  Psychiatric: He has a normal mood and affect. His behavior is normal.    ED Course  Procedures  Results for orders placed during the hospital encounter of 11/17/11  GLUCOSE, CAPILLARY      Component Value Range   Glucose-Capillary 88  70 - 99 (mg/dL)  GLUCOSE, CAPILLARY      Component Value Range   Glucose-Capillary 117 (*) 70 - 99 (mg/dL)  CBC      Component Value Range   WBC 5.0  4.0 - 10.5 (K/uL)   RBC 4.95  4.22 - 5.81 (MIL/uL)   Hemoglobin 13.1  13.0 - 17.0 (g/dL)   HCT 38.4 (*) 39.0 - 52.0 (%)   MCV 77.6 (*) 78.0 - 100.0 (fL)   MCH 26.5  26.0 - 34.0 (pg)   MCHC 34.1  30.0 - 36.0 (g/dL)   RDW 13.7  11.5 - 15.5 (%)   Platelets 149 (*) 150 - 400 (K/uL)  DIFFERENTIAL      Component Value Range   Neutrophils Relative 56  43 - 77 (%)   Neutro Abs 2.8  1.7 - 7.7 (K/uL)   Lymphocytes Relative 32  12 - 46 (%)   Lymphs Abs 1.6  0.7 - 4.0 (K/uL)   Monocytes Relative 6  3 - 12 (%)   Monocytes Absolute 0.3  0.1 - 1.0 (K/uL)   Eosinophils Relative 5  0 - 5 (%)   Eosinophils Absolute 0.2  0.0 - 0.7 (K/uL)    Basophils Relative 0  0 - 1 (%)   Basophils Absolute 0.0  0.0 - 0.1 (K/uL)  COMPREHENSIVE METABOLIC PANEL      Component Value Range   Sodium 140  135 - 145 (mEq/L)   Potassium 3.7  3.5 - 5.1 (mEq/L)  Chloride 104  96 - 112 (mEq/L)   CO2 25  19 - 32 (mEq/L)   Glucose, Bld 120 (*) 70 - 99 (mg/dL)   BUN 14  6 - 23 (mg/dL)   Creatinine, Ser 0.77  0.50 - 1.35 (mg/dL)   Calcium 9.3  8.4 - 10.5 (mg/dL)   Total Protein 8.6 (*) 6.0 - 8.3 (g/dL)   Albumin 3.9  3.5 - 5.2 (g/dL)   AST 25  0 - 37 (U/L)   ALT 21  0 - 53 (U/L)   Alkaline Phosphatase 72  39 - 117 (U/L)   Total Bilirubin 0.2 (*) 0.3 - 1.2 (mg/dL)   GFR calc non Af Amer >90  >90 (mL/min)   GFR calc Af Amer >90  >90 (mL/min)  LIPASE, BLOOD      Component Value Range   Lipase 39  11 - 59 (U/L)  URINALYSIS, ROUTINE W REFLEX MICROSCOPIC      Component Value Range   Color, Urine YELLOW  YELLOW    APPearance CLOUDY (*) CLEAR    Specific Gravity, Urine 1.025  1.005 - 1.030    pH 5.5  5.0 - 8.0    Glucose, UA NEGATIVE  NEGATIVE (mg/dL)   Hgb urine dipstick NEGATIVE  NEGATIVE    Bilirubin Urine NEGATIVE  NEGATIVE    Ketones, ur TRACE (*) NEGATIVE (mg/dL)   Protein, ur NEGATIVE  NEGATIVE (mg/dL)   Urobilinogen, UA 0.2  0.0 - 1.0 (mg/dL)   Nitrite NEGATIVE  NEGATIVE    Leukocytes, UA SMALL (*) NEGATIVE   URINE MICROSCOPIC-ADD ON      Component Value Range   Squamous Epithelial / LPF RARE  RARE    WBC, UA 7-10  <3 (WBC/hpf)   Urine-Other MUCOUS PRESENT     Dg Abd Acute W/chest  11/17/2011  *RADIOLOGY REPORT*  Clinical Data: Abdominal pain, weakness  ACUTE ABDOMEN SERIES (ABDOMEN 2 VIEW & CHEST 1 VIEW)  Comparison: 06/11/2009  Findings: Cardiomediastinal silhouette is unremarkable.  No acute infiltrate or pleural effusion.  No pulmonary edema.  There is a nonspecific nonobstructive bowel gas pattern.  Stable postsurgical change in sacral region. Pelvic phleboliths are again noted.  No free abdominal air.  IMPRESSION: No active  disease.  No significant change.  Original Report Authenticated By: Lahoma Crocker, M.D.     MDM   Patient has small amount of leukocytes and urine. We'll treat as urinary tract infection. Order urine cultures. Discuss results with patient. Advised followup with his primary care physician if symptoms continued. Lipid 7 days worth of Cipro for treatment of UTI.      Sheliah Mends, PA-C 11/17/11 1948

## 2011-11-17 NOTE — ED Notes (Signed)
[id number] Expected date:<BR> Expected time: 2:07 PM<BR> Means of arrival:Ambulance<BR> Comments:<BR> M61 - 60yoM Multiple complaints, Chronic pain, flu ss, anxiety.  Triage capable

## 2011-11-17 NOTE — ED Notes (Signed)
Pt states, "I woke up and it feels like my whole body is on fire, associated with chills"

## 2011-11-18 ENCOUNTER — Telehealth: Payer: Self-pay | Admitting: *Deleted

## 2011-11-18 NOTE — ED Provider Notes (Signed)
Medical screening examination/treatment/procedure(s) were performed by non-physician practitioner and as supervising physician I was immediately available for consultation/collaboration.   Veryl Speak, MD 11/18/11 [phone]

## 2011-11-18 NOTE — Telephone Encounter (Signed)
Spoke with patient's case manager, Myriam Jacobson regarding whether he would benefit from a referral to nurse case [facility].  He is meeting with [name] tomorrow to do his SPAP and he will talk to him to find out if he would be willing to meet with Yvonna Alanis with [facility]. Myrtis Hopping CMA

## 2011-11-19 ENCOUNTER — Other Ambulatory Visit: Payer: Self-pay | Admitting: *Deleted

## 2011-11-19 DIAGNOSIS — B20 Human immunodeficiency virus [HIV] disease: Secondary | ICD-10-CM

## 2011-11-19 LAB — URINE CULTURE: Culture  Setup Time: 201302110255

## 2011-11-20 NOTE — Telephone Encounter (Signed)
Patient needs assistance with transportation, so for now he is being referred to [facility]. Myrtis Hopping CMA

## 2011-11-21 ENCOUNTER — Encounter (HOSPITAL_COMMUNITY): Payer: Self-pay | Admitting: Emergency Medicine

## 2011-11-21 ENCOUNTER — Emergency Department (HOSPITAL_COMMUNITY)
Admission: EM | Admit: 2011-11-21 | Discharge: 2011-11-21 | Disposition: A | Discharge status: Home / Self Care | Payer: Medicare Other | Attending: Emergency Medicine | Admitting: Emergency Medicine

## 2011-11-21 DIAGNOSIS — Z79899 Other long term (current) drug therapy: Secondary | ICD-10-CM | POA: Insufficient documentation

## 2011-11-21 DIAGNOSIS — E119 Type 2 diabetes mellitus without complications: Secondary | ICD-10-CM | POA: Insufficient documentation

## 2011-11-21 DIAGNOSIS — M109 Gout, unspecified: Secondary | ICD-10-CM | POA: Insufficient documentation

## 2011-11-21 DIAGNOSIS — E785 Hyperlipidemia, unspecified: Secondary | ICD-10-CM | POA: Insufficient documentation

## 2011-11-21 DIAGNOSIS — Z21 Asymptomatic human immunodeficiency virus [HIV] infection status: Secondary | ICD-10-CM | POA: Insufficient documentation

## 2011-11-21 DIAGNOSIS — I10 Essential (primary) hypertension: Secondary | ICD-10-CM | POA: Insufficient documentation

## 2011-11-21 MED ORDER — OXYCODONE-ACETAMINOPHEN 5-325 MG PO TABS: 1.0000 | ORAL_TABLET | Freq: Four times a day (QID) | ORAL | Status: AC | PRN

## 2011-11-21 MED ORDER — INDOMETHACIN 25 MG PO CAPS: 25.0000 mg | ORAL_CAPSULE | Freq: Three times a day (TID) | ORAL | Status: DC | PRN

## 2011-11-21 MED ORDER — OXYCODONE-ACETAMINOPHEN 5-325 MG PO TABS
1.0000 | ORAL_TABLET | Freq: Once | ORAL | Status: AC
  Administered 2011-11-21: 1 via ORAL
  Filled 2011-11-21: qty 1

## 2011-11-21 NOTE — ED Provider Notes (Signed)
History     CSN: [id number]  Arrival date & time 11/21/11  [id number]   First MD Initiated Contact with Patient 11/21/11 0602      Chief Complaint  Patient presents with  . Gout    (Consider location/radiation/quality/duration/timing/severity/associated sxs/prior treatment) HPI Comments: Patient with history of gout, HIV, DM -- presents with great toe pain of his left foot consistent with his previous gout. Patient has been taking allopurinol. He was seen in emergency department several days ago for flulike symptoms and discharged home on Cipro for a urinary tract infection. The pain is made worse with palpation or walking. Patient denies fever or redness streaking up his leg.   Patient is a 61 y.o. male presenting with lower extremity pain. The history is provided by the patient.  Foot Pain This is a new problem. The current episode started yesterday. The problem occurs constantly. The problem has been gradually worsening. Associated symptoms include arthralgias and joint swelling. Pertinent negatives include no fever, numbness or weakness. The symptoms are aggravated by walking. He has tried nothing for the symptoms.    Past Medical History  Diagnosis Date  . Diabetes mellitus   . Hypertension   . Angina   . Shortness of breath   . Headache   . Gout   . Hyperlipidemia   . HIV infection     Past Surgical History  Procedure Date  . Back surgery     Family History  Problem Relation Age of Onset  . Diabetes Mother   . Diabetes Father   . Diabetes Brother     History  Substance Use Topics  . Smoking status: Never Smoker   . Smokeless tobacco: Never Used  . Alcohol Use: No      Review of Systems  Constitutional: Negative for fever and activity change.  Musculoskeletal: Positive for joint swelling and arthralgias. Negative for back pain and gait problem.  Skin: Negative for wound.  Neurological: Negative for weakness and numbness.    Allergies  Review of patient's  allergies indicates no known allergies.  Home Medications   Current Outpatient Rx  Name Route Sig Dispense Refill  . ALLOPURINOL 100 MG PO TABS Oral Take 200 mg by mouth daily.    . ASPIRIN 81 MG PO TBEC Oral Take 1 tablet (81 mg total) by mouth daily. 31 tablet 0  . CIPROFLOXACIN HCL 500 MG PO TABS Oral Take 1 tablet (500 mg total) by mouth 2 (two) times daily. 20 tablet 0  . DILTIAZEM HCL ER BEADS 240 MG PO CP24 Oral Take 240 mg by mouth daily.      Marland Kitchen EMTRICITAB-RILPIVIR-TENOFOVIR 200-25-300 MG PO TABS Oral Take 1 tablet by mouth daily. 30 tablet 11  . GABAPENTIN 300 MG PO CAPS Oral Take 300 mg by mouth 3 (three) times daily.      Marland Kitchen GLIMEPIRIDE 2 MG PO TABS Oral Take 2 mg by mouth 3 (three) times daily.      . INSULIN GLARGINE 100 UNIT/ML [place] SOLN Subcutaneous Inject 30 Units into the skin at bedtime.     Marland Kitchen METFORMIN HCL 500 MG PO TABS Oral Take 1,000 mg by mouth 2 (two) times daily with a meal.      . METOPROLOL SUCCINATE ER 25 MG PO TB24 Oral Take 25 mg by mouth daily.      Marland Kitchen ONDANSETRON 8 MG PO TBDP Oral Take 1 tablet (8 mg total) by mouth every 8 (eight) hours as needed for nausea. 20 tablet 0  .  PAROXETINE HCL 20 MG PO TABS Oral Take 1 tablet (20 mg total) by mouth every morning.    Marland Kitchen PRAVASTATIN SODIUM 80 MG PO TABS Oral Take 1 tablet (80 mg total) by mouth daily.    . IBUPROFEN 400 MG PO TABS Oral Take 400 mg by mouth every 6 (six) hours as needed. For pain     . INDOMETHACIN 25 MG PO CAPS Oral Take 1 capsule (25 mg total) by mouth 3 (three) times daily as needed. 21 capsule 0  . OXYCODONE-ACETAMINOPHEN 5-325 MG PO TABS Oral Take 1-2 tablets by mouth every 6 (six) hours as needed for pain. 10 tablet 0    BP 127/81  Pulse 105  Temp 98 F (36.7 C)  Resp 16  Wt 172 lb (78.019 kg)  SpO2 99%  Physical Exam  Nursing note and vitals reviewed. Constitutional: He is oriented to person, place, and time. He appears well-developed and well-nourished.  HENT:  Head: Normocephalic and  atraumatic.  Eyes: Conjunctivae are normal.  Neck: Normal range of motion. Neck supple.  Cardiovascular:  Pulses:      Dorsalis pedis pulses are 2+ on the right side, and 2+ on the left side.       Posterior tibial pulses are 2+ on the right side, and 2+ on the left side.  Musculoskeletal: Normal range of motion. He exhibits tenderness. He exhibits no edema.       Left foot: He exhibits tenderness and swelling. He exhibits normal range of motion.       Feet:  Neurological: He is alert and oriented to person, place, and time. No sensory deficit.       Motor, sensation, and vascular distal to the injury is fully intact.   Skin: Skin is warm and dry.  Psychiatric: He has a normal mood and affect.    ED Course  Procedures (including critical care time)  Labs Reviewed - No data to display No results found.   1. Gout    7:08 AM Patient seen and examined.   Vital signs reviewed and are as follows: Filed Vitals:   11/21/11 0651  BP: 119/84  Pulse: 96  Temp:   Resp: 16   Patient was discussed with April K Palumbo-Rasch, MD  The patient was urged to return to the Emergency Department urgently with worsening pain, swelling, expanding erythema especially if it streaks away from the affected area, fever, or if they have any other concerns. Patient verbalized understanding.    MDM  Patients with symptoms of gout, consistent with his previous gout. Will treat with indomethacin and Percocet. No concern for cellulitis or septic arthritis.        [place], [place] 11/21/11 [phone]

## 2011-11-21 NOTE — ED Notes (Signed)
Pt alert, nad, c/o gout pain left foot, onset a few days ago, resp even unlabored, skin pwd

## 2011-11-21 NOTE — Discharge Instructions (Signed)
Please read and follow all provided instructions.  Your diagnoses today include:  1. Gout     Tests performed today include:  Vital signs. See below for your results today.   Medications prescribed:   Indomethacin - an antiinflammatory medication  Percocet (oxycodone/acetaminophen) for pain -- do not combine with other medications containing Tylenol or acetaminophen  Take any prescribed medications only as directed. You have been prescribed pain medicine that can make you drowsy. Do not drive or perform any activities that require you to be awake and alert if taking this medication.   Follow-up instructions: Please follow-up with your primary care provider in the next 1 week for further evaluation of your symptoms. If you do not have a primary care doctor -- see below for referral information.   Return instructions:   Please return to the Emergency Department if you experience worsening symptoms, fever.   Please return if you have any other concerns.  Additional Information:  Your vital signs today were: BP 127/81  Pulse 105  Temp 98 F (36.7 C)  Resp 16  Wt 172 lb (78.019 kg)  SpO2 99% If your blood pressure (BP) was elevated above 135/85 this visit, please have this repeated by your doctor within one month. -------------- No Primary Care Doctor Call [facility]  [phone] Other agencies that provide inexpensive medical care    [place]  [facility]  [place]  [phone]    [facility]  [phone]    [facility]  [facility]  [phone] -------------- RESOURCE GUIDE:  Dental Problems  Patients with Medicaid: [facility] [phone] [address] Phone:  [phone]                                                   Phone:  [phone]  If unable to pay or  uninsured, contact:  [facility] or [facility]. to become qualified for the adult dental clinic.  Chronic Pain Problems Contact [facility]  [phone] Patients need to be referred by their primary care doctor.  Insufficient Money for Medicine Contact [employer]:  call "211" or [place] [phone].  [place]  [phone] [facility]  [place]   [phone] (emergency services [phone])  Substance Abuse [facility]  [phone] [facility] [phone] [facility] [place] [phone] [facility] [phone] Residential & Outpatient Substance Abuse Program  [phone]  Abuse/Neglect [place] [phone] [place] [phone] (After Hours)  Emergency [place] [phone]  [place] at the [place] [phone] [place] [phone]  [facility]  [facility] Dept. [address] Phone:  [phone]                                   Phone:  [phone]                 Phone:  [place] Phone:  [place] [phone] (After Hours)

## 2011-11-21 NOTE — ED Provider Notes (Signed)
Medical screening examination/treatment/procedure(s) were performed by non-physician practitioner and as supervising physician I was immediately available for consultation/collaboration.  Carlisle Beers, MD 11/21/11 [phone]

## 2011-11-25 ENCOUNTER — Other Ambulatory Visit: Payer: Self-pay | Admitting: *Deleted

## 2011-11-25 DIAGNOSIS — G629 Polyneuropathy, unspecified: Secondary | ICD-10-CM

## 2011-11-25 DIAGNOSIS — B20 Human immunodeficiency virus [HIV] disease: Secondary | ICD-10-CM

## 2011-11-25 MED ORDER — EMTRICITAB-RILPIVIR-TENOFOV DF 200-25-300 MG PO TABS: 1.0000 | ORAL_TABLET | Freq: Every day | ORAL | Status: DC

## 2011-11-25 MED ORDER — GABAPENTIN 300 MG PO CAPS: 300.0000 mg | ORAL_CAPSULE | Freq: Three times a day (TID) | ORAL | Status: DC

## 2011-12-05 ENCOUNTER — Emergency Department (HOSPITAL_COMMUNITY)
Admission: EM | Admit: 2011-12-05 | Discharge: 2011-12-05 | Disposition: A | Discharge status: Home Health | Payer: Medicare Other | Attending: Emergency Medicine | Admitting: Emergency Medicine

## 2011-12-05 ENCOUNTER — Other Ambulatory Visit: Payer: Self-pay

## 2011-12-05 ENCOUNTER — Encounter (HOSPITAL_COMMUNITY): Payer: Self-pay | Admitting: *Deleted

## 2011-12-05 ENCOUNTER — Emergency Department (HOSPITAL_COMMUNITY): Payer: Medicare Other

## 2011-12-05 DIAGNOSIS — E119 Type 2 diabetes mellitus without complications: Secondary | ICD-10-CM | POA: Insufficient documentation

## 2011-12-05 DIAGNOSIS — R079 Chest pain, unspecified: Secondary | ICD-10-CM | POA: Insufficient documentation

## 2011-12-05 DIAGNOSIS — E785 Hyperlipidemia, unspecified: Secondary | ICD-10-CM | POA: Insufficient documentation

## 2011-12-05 DIAGNOSIS — Z21 Asymptomatic human immunodeficiency virus [HIV] infection status: Secondary | ICD-10-CM | POA: Insufficient documentation

## 2011-12-05 DIAGNOSIS — I10 Essential (primary) hypertension: Secondary | ICD-10-CM | POA: Insufficient documentation

## 2011-12-05 DIAGNOSIS — Z794 Long term (current) use of insulin: Secondary | ICD-10-CM | POA: Insufficient documentation

## 2011-12-05 DIAGNOSIS — R0989 Other specified symptoms and signs involving the circulatory and respiratory systems: Secondary | ICD-10-CM | POA: Insufficient documentation

## 2011-12-05 DIAGNOSIS — R0609 Other forms of dyspnea: Secondary | ICD-10-CM | POA: Insufficient documentation

## 2011-12-05 HISTORY — DX: Cocaine use, unspecified, uncomplicated: F14.90

## 2011-12-05 LAB — DIFFERENTIAL
Basophils Absolute: 0 10*3/uL (ref 0.0–0.1)
Eosinophils Relative: 0 % (ref 0–5)
Lymphocytes Relative: 22 % (ref 12–46)
Monocytes Relative: 8 % (ref 3–12)
Neutro Abs: 3.7 10*3/uL (ref 1.7–7.7)

## 2011-12-05 LAB — CBC
HCT: 33.5 % — ABNORMAL LOW (ref 39.0–52.0)
MCV: 76.3 fL — ABNORMAL LOW (ref 78.0–100.0)
RBC: 4.39 MIL/uL (ref 4.22–5.81)
RDW: 14.5 % (ref 11.5–15.5)
WBC: 5.2 10*3/uL (ref 4.0–10.5)

## 2011-12-05 LAB — COMPREHENSIVE METABOLIC PANEL
ALT: 16 U/L (ref 0–53)
Albumin: 4 g/dL (ref 3.5–5.2)
Alkaline Phosphatase: 66 U/L (ref 39–117)
Chloride: 99 mEq/L (ref 96–112)
Potassium: 3.6 mEq/L (ref 3.5–5.1)
Sodium: 134 mEq/L — ABNORMAL LOW (ref 135–145)
Total Bilirubin: 0.3 mg/dL (ref 0.3–1.2)
Total Protein: 7.7 g/dL (ref 6.0–8.3)

## 2011-12-05 MED ORDER — ASPIRIN 325 MG PO TABS
325.0000 mg | ORAL_TABLET | Freq: Once | ORAL | Status: AC
  Administered 2011-12-05: 325 mg via ORAL
  Filled 2011-12-05: qty 1

## 2011-12-05 MED ORDER — IBUPROFEN 800 MG PO TABS: 800.0000 mg | ORAL_TABLET | Freq: Three times a day (TID) | ORAL | Status: AC

## 2011-12-05 MED ORDER — KETOROLAC TROMETHAMINE 30 MG/ML IJ SOLN
30.0000 mg | Freq: Once | INTRAMUSCULAR | Status: AC
  Administered 2011-12-05: 30 mg via INTRAVENOUS
  Filled 2011-12-05: qty 1

## 2011-12-05 NOTE — ED Notes (Signed)
Patient transported to X-ray 

## 2011-12-05 NOTE — ED Notes (Signed)
Pt getting dressed waiting for discharge paperwork

## 2011-12-05 NOTE — ED Notes (Signed)
Pt reports using crack cocaine at 04:00 this morning states he has been having intermittent left sided chest pain described as sharp. Reports having nausea earlier today and sob.  Pt is A/O x4. Skin warm and dry. Respirations even and unlabored. NAD noted at this time.

## 2011-12-05 NOTE — ED Provider Notes (Signed)
History     CSN: [id number]  Arrival date & time 12/05/11  0941   First MD Initiated Contact with Patient 12/05/11 1016      Chief Complaint  Patient presents with  . Chest Pain     HPI Patient presents with chest pain.  He has had pain in his left upper chest for the past year.  The pain gradually became worse 1 day ago.  Since this worsening, he has not achieved relief with narcotic use.  He also notes mild baseline dyspnea, worse over the past day.  No new cough, no fever, no emesis.  Notably, the patient used crack the evening prior to his pain increase. The patient denies any confusion, disorientation, exertional worsening, pleuritic pain. He is compliant with medications. Past Medical History  Diagnosis Date  . Diabetes mellitus   . Hypertension   . Angina   . Shortness of breath   . Headache   . Gout   . Hyperlipidemia   . HIV infection   . Crack cocaine use     Past Surgical History  Procedure Date  . Back surgery     Family History  Problem Relation Age of Onset  . Diabetes Mother   . Diabetes Father   . Diabetes Brother     History  Substance Use Topics  . Smoking status: Never Smoker   . Smokeless tobacco: Never Used  . Alcohol Use: No      Review of Systems  Constitutional:       Per HPI, otherwise negative  HENT:       Per HPI, otherwise negative  Eyes: Negative.   Respiratory:       Per HPI, otherwise negative  Cardiovascular:       Per HPI, otherwise negative  Gastrointestinal: Negative for vomiting.  Genitourinary: Negative.   Musculoskeletal:       Per HPI, otherwise negative  Skin: Negative.   Neurological: Negative for syncope.    Allergies  Review of patient's allergies indicates no known allergies.  Home Medications   Current Outpatient Rx  Name Route Sig Dispense Refill  . ALLOPURINOL 100 MG PO TABS Oral Take 200 mg by mouth daily.    . ASPIRIN 81 MG PO TBEC Oral Take 1 tablet (81 mg total) by mouth daily. 31 tablet 0   . DILTIAZEM HCL ER BEADS 240 MG PO CP24 Oral Take 240 mg by mouth daily.      Marland Kitchen EMTRICITAB-RILPIVIR-TENOFOVIR 200-25-300 MG PO TABS Oral Take 1 tablet by mouth daily. 30 tablet 11  . GABAPENTIN 300 MG PO CAPS Oral Take 1 capsule (300 mg total) by mouth 3 (three) times daily. 90 capsule 6  . GLIMEPIRIDE 2 MG PO TABS Oral Take 2 mg by mouth 3 (three) times daily.      . IBUPROFEN 400 MG PO TABS Oral Take 400 mg by mouth every 6 (six) hours as needed. For pain     . INDOMETHACIN 25 MG PO CAPS Oral Take 1 capsule (25 mg total) by mouth 3 (three) times daily as needed. 21 capsule 0  . INSULIN GLARGINE 100 UNIT/ML [place] SOLN Subcutaneous Inject 30 Units into the skin at bedtime.     Marland Kitchen METFORMIN HCL 1000 MG PO TABS Oral Take 1,000 mg by mouth 2 (two) times daily with a meal.    . PAROXETINE HCL 20 MG PO TABS Oral Take 1 tablet (20 mg total) by mouth every morning.    Marland Kitchen PRAVASTATIN SODIUM  80 MG PO TABS Oral Take 1 tablet (80 mg total) by mouth daily.    Marland Kitchen METOPROLOL SUCCINATE ER 25 MG PO TB24 Oral Take 25 mg by mouth daily.        BP 124/80  Pulse 88  Temp 98 F (36.7 C)  Resp 20  Wt 172 lb (78.019 kg)  SpO2 98%  Physical Exam  Nursing note and vitals reviewed. Constitutional: He is oriented to person, place, and time. He appears well-developed. No distress.  HENT:  Head: Normocephalic and atraumatic.  Eyes: Conjunctivae and EOM are normal.  Cardiovascular: Normal rate and regular rhythm.   Pulmonary/Chest: Effort normal. No stridor. No respiratory distress.  Abdominal: He exhibits no distension.  Musculoskeletal: He exhibits no edema.  Neurological: He is alert and oriented to person, place, and time.  Skin: Skin is warm and dry.  Psychiatric: He has a normal mood and affect.    ED Course  Procedures (including critical care time)   Labs Reviewed  CBC  DIFFERENTIAL  COMPREHENSIVE METABOLIC PANEL  TROPONIN I   No results found.   No diagnosis found.  Pulse oximetry 100% room  air normal Cardiac monitor 85 sinus rhythm normal  Date: 12/05/2011  Rate: 75  Rhythm: normal sinus rhythm  QRS Axis: normal  Intervals: normal  ST/T Wave abnormalities: nonspecific T wave changes, ST changes diffusely  Conduction Disutrbances:none  Narrative Interpretation:   Old EKG Reviewed: unchanged  ABNORMAL - w minimal changes from 09/24/11 - mild ST elevations diffusely  CXR reviewed by me   A/p: Male with chronic chest pain now worse after crack use.  Rule out ACS.  Symptoms likely secondary to cocaine use. MDM  This gentleman with one year of left sided chest pain presents with several days of worsening pain following use of crack cocaine.  On exam the patient is in no distress ear the patient's vital signs, labs, ECG, chest x-ray are all reassuring for absence of acute coronary syndrome.  The patient's ECG does not have subtle ST changes which may reflect pericarditis, though given the patient's long history of chest this seems less likely.  Given his absence of acute findings, the patient's chronicity of pain, his recent crack cocaine use, she will be discharged to follow up with his primary care physician for additional evaluation.  He will go home on a course of anti-inflammatories.        Carmin Muskrat, MD 12/05/11 1234

## 2011-12-05 NOTE — ED Notes (Signed)
Pt states "I got with the wrong crowd, been doing crack since Tuesday, have done crack before, since I was 41"

## 2011-12-05 NOTE — ED Notes (Signed)
EKG GIVEN TO DR. LOCKWOOD

## 2011-12-05 NOTE — Discharge Instructions (Signed)
It is extremely important that you do not use crack cocaine again.  It is also very important that you follow up with your primary care physician to continue evaluation of your chest pain.  Please return to the Emergency Department for any concerning new changes in your pain.

## 2011-12-10 ENCOUNTER — Ambulatory Visit (INDEPENDENT_AMBULATORY_CARE_PROVIDER_SITE_OTHER): Payer: Medicare Other | Admitting: Infectious Diseases

## 2011-12-10 ENCOUNTER — Encounter: Payer: Self-pay | Admitting: *Deleted

## 2011-12-10 ENCOUNTER — Encounter: Payer: Self-pay | Admitting: Infectious Diseases

## 2011-12-10 DIAGNOSIS — Z21 Asymptomatic human immunodeficiency virus [HIV] infection status: Secondary | ICD-10-CM

## 2011-12-10 DIAGNOSIS — B20 Human immunodeficiency virus [HIV] disease: Secondary | ICD-10-CM

## 2011-12-10 DIAGNOSIS — E119 Type 2 diabetes mellitus without complications: Secondary | ICD-10-CM

## 2011-12-10 DIAGNOSIS — R079 Chest pain, unspecified: Secondary | ICD-10-CM

## 2011-12-10 NOTE — Progress Notes (Signed)
This patient was scheduled for a Cardiology exam based on his stress test. The appt was made with [facility] for 11/29/11 at 230 with Dr Ellyn Hack (see note from 11/07/11). When I tried to reschedule the patient today was told to have the patient call the office at [phone] as they have all they need from this office and they have better luck rescheduling patients if they can talk to them directly. Advised will give the patient this information and have him call asap. This patient is working with [facility] and will pass this information to his counselor also.

## 2011-12-10 NOTE — Assessment & Plan Note (Signed)
He appears healthy but has not had recent labs. Will check them again, and get genotype as last VL was detectable. Offered/refused condoms. See him back in 3 months

## 2011-12-10 NOTE — Progress Notes (Signed)
  Subjective:    Patient ID: Michael [name], male    DOB: 1950-11-16, 61 y.o.   MRN: [id number]  HPI 61 yo M with newly Dx HIV+ (CD4 350 VL 44,500 November 2012). Started on complera. Had recent Ed visit for bronchitis and gout flare, then visit for  crack cocaine use. Had previously been clean for 4 years.  Feeling nervous in his stomach- keeps having trouble with gout flares, neck pain. Attributes neck pain to spinal fusion in 1988. Hard to rest at night due to knee pain. occas has swelling in his knee.  Was given oxycodone at ED for his gout flare.  HIV 1 RNA Quant (copies/mL)  Date Value  10/18/2011 62586*  08/27/2011 44500*     CD4 T Cell Abs (cmm)  Date Value  10/18/2011 380*  08/27/2011 350*    Lab Results  Component Value Date   CHOL 207* 10/18/2011   HDL 45 10/18/2011   LDLCALC 127* 10/18/2011   TRIG 177* 10/18/2011   CHOLHDL 4.6 10/18/2011      Review of Systems  Constitutional: Negative for fever, chills, appetite change and unexpected weight change.  Respiratory: Negative for shortness of breath.   Cardiovascular: Positive for chest pain.       Occas sharp L upper chest pain. Sometimes with SOB, radiates down arm, shoulder blade; no nausea, no diaphoresis.   Gastrointestinal: Negative for diarrhea and constipation.  Genitourinary: Negative for dysuria.  Neurological: Positive for headaches.  Has not seen ophtho in >1 year. Needs new DM MD, FSG 133 this AM (better since cocaine binge).      Objective:   Physical Exam  Constitutional: He appears well-developed and well-nourished.  Eyes: EOM are normal. Pupils are equal, round, and reactive to light.  Neck: Neck supple.  Cardiovascular: Normal rate, regular rhythm and normal heart sounds.   Pulmonary/Chest: Effort normal and breath sounds normal. He has no wheezes.  Abdominal: Soft. Bowel sounds are normal. He exhibits no distension. There is no tenderness.  Musculoskeletal: He exhibits no edema and no tenderness.         Legs: Lymphadenopathy:    He has no cervical adenopathy.          Assessment & Plan:

## 2011-12-10 NOTE — Assessment & Plan Note (Signed)
Will refer him to CV

## 2011-12-10 NOTE — Assessment & Plan Note (Signed)
Will have him seen by IM. He needs a new DM MD. chek A1c today

## 2011-12-11 LAB — COMPREHENSIVE METABOLIC PANEL
BUN: 12 mg/dL (ref 6–23)
CO2: 25 mEq/L (ref 19–32)
Calcium: 9.5 mg/dL (ref 8.4–10.5)
Chloride: 104 mEq/L (ref 96–112)
Creat: 1.03 mg/dL (ref 0.50–1.35)
Glucose, Bld: 143 mg/dL — ABNORMAL HIGH (ref 70–99)

## 2011-12-11 LAB — CBC
Hemoglobin: 12.5 g/dL — ABNORMAL LOW (ref 13.0–17.0)
MCHC: 33.2 g/dL (ref 30.0–36.0)
Platelets: 195 10*3/uL (ref 150–400)
RBC: 4.82 MIL/uL (ref 4.22–5.81)

## 2011-12-11 LAB — RPR

## 2011-12-11 LAB — LIPID PANEL
Cholesterol: 118 mg/dL (ref 0–200)
VLDL: 21 mg/dL (ref 0–40)

## 2011-12-11 LAB — HEMOGLOBIN A1C
Hgb A1c MFr Bld: 9.3 % — ABNORMAL HIGH (ref <5.7)
Mean Plasma Glucose: 220 mg/dL — ABNORMAL HIGH (ref <117)

## 2011-12-12 ENCOUNTER — Telehealth: Payer: Self-pay | Admitting: *Deleted

## 2011-12-12 LAB — HIV-1 RNA ULTRAQUANT REFLEX TO GENTYP+
HIV 1 RNA Quant: 1503 copies/mL — ABNORMAL HIGH (ref <20)
HIV-1 RNA Quant, Log: 3.18 {Log} — ABNORMAL HIGH (ref <1.30)

## 2011-12-12 NOTE — Telephone Encounter (Signed)
Patient called and advised that he spoke with the provider at his visit 12/10/11 about his Gout and associated pain. He said he thought there would be some Indocin and/or some pain med called to his pharmacy.  Advised him after checking his med profile that nothing has been called in but that I will send the provider a message and see if he wants me to call something in. He said that he is more swollen in the knee and foot of his L leg. Asked him to give me some time and will get back to him after I speak with the provider.

## 2011-12-13 ENCOUNTER — Telehealth: Payer: Self-pay | Admitting: *Deleted

## 2011-12-13 MED ORDER — INDOMETHACIN 25 MG PO CAPS: 25.0000 mg | ORAL_CAPSULE | Freq: Three times a day (TID) | ORAL | Status: DC | PRN

## 2011-12-13 NOTE — Telephone Encounter (Signed)
Patient's phone is not working.  Called and left message with his sister, listed as emergency contact that the Rx he requested has been sent to his pharmacy. Myrtis Hopping CMA

## 2011-12-13 NOTE — Telephone Encounter (Signed)
His indocin has been refilled.

## 2011-12-16 ENCOUNTER — Telehealth: Payer: Self-pay | Admitting: *Deleted

## 2011-12-16 DIAGNOSIS — B20 Human immunodeficiency virus [HIV] disease: Secondary | ICD-10-CM | POA: Insufficient documentation
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
## 2011-12-18 ENCOUNTER — Encounter: Payer: Self-pay | Admitting: *Deleted

## 2011-12-18 NOTE — Progress Notes (Signed)
BC transported pt to [facility] today. Pt saw Dr. Glenetta Hew. Pt complained of ongoing SOB and chest pain. Pt has been scheduled for a nuclear stress test on March 22nd at 8am and will then follow up Dr. Ellyn Hack on April 17th at 11:40am.

## 2011-12-19 LAB — HIV-1 GENOTYPR PLUS

## 2011-12-27 HISTORY — PX: NM MYOCAR PERF WALL MOTION: HXRAD629

## 2012-01-08 ENCOUNTER — Encounter (HOSPITAL_COMMUNITY): Payer: Self-pay | Admitting: Emergency Medicine

## 2012-01-08 ENCOUNTER — Emergency Department (HOSPITAL_COMMUNITY)
Admission: EM | Admit: 2012-01-08 | Discharge: 2012-01-09 | Disposition: A | Discharge status: Home / Self Care | Payer: 59 | Source: Home / Self Care | Attending: Emergency Medicine | Admitting: Emergency Medicine

## 2012-01-08 DIAGNOSIS — Z8639 Personal history of other endocrine, nutritional and metabolic disease: Secondary | ICD-10-CM | POA: Insufficient documentation

## 2012-01-08 DIAGNOSIS — F29 Unspecified psychosis not due to a substance or known physiological condition: Secondary | ICD-10-CM

## 2012-01-08 DIAGNOSIS — E785 Hyperlipidemia, unspecified: Secondary | ICD-10-CM | POA: Insufficient documentation

## 2012-01-08 DIAGNOSIS — E119 Type 2 diabetes mellitus without complications: Secondary | ICD-10-CM | POA: Insufficient documentation

## 2012-01-08 DIAGNOSIS — Z7982 Long term (current) use of aspirin: Secondary | ICD-10-CM | POA: Insufficient documentation

## 2012-01-08 DIAGNOSIS — Z21 Asymptomatic human immunodeficiency virus [HIV] infection status: Secondary | ICD-10-CM | POA: Insufficient documentation

## 2012-01-08 DIAGNOSIS — Z794 Long term (current) use of insulin: Secondary | ICD-10-CM | POA: Insufficient documentation

## 2012-01-08 DIAGNOSIS — Z862 Personal history of diseases of the blood and blood-forming organs and certain disorders involving the immune mechanism: Secondary | ICD-10-CM | POA: Insufficient documentation

## 2012-01-08 DIAGNOSIS — F22 Delusional disorders: Secondary | ICD-10-CM | POA: Insufficient documentation

## 2012-01-08 DIAGNOSIS — F329 Major depressive disorder, single episode, unspecified: Secondary | ICD-10-CM | POA: Insufficient documentation

## 2012-01-08 DIAGNOSIS — F3289 Other specified depressive episodes: Secondary | ICD-10-CM | POA: Insufficient documentation

## 2012-01-08 DIAGNOSIS — R45851 Suicidal ideations: Secondary | ICD-10-CM | POA: Insufficient documentation

## 2012-01-08 DIAGNOSIS — B20 Human immunodeficiency virus [HIV] disease: Secondary | ICD-10-CM

## 2012-01-08 DIAGNOSIS — G629 Polyneuropathy, unspecified: Secondary | ICD-10-CM

## 2012-01-08 DIAGNOSIS — Z79899 Other long term (current) drug therapy: Secondary | ICD-10-CM | POA: Insufficient documentation

## 2012-01-08 DIAGNOSIS — G589 Mononeuropathy, unspecified: Secondary | ICD-10-CM | POA: Insufficient documentation

## 2012-01-08 DIAGNOSIS — I10 Essential (primary) hypertension: Secondary | ICD-10-CM | POA: Insufficient documentation

## 2012-01-08 LAB — BASIC METABOLIC PANEL
BUN: 15 mg/dL (ref 6–23)
CO2: 26 mEq/L (ref 19–32)
Chloride: 99 mEq/L (ref 96–112)
Creatinine, Ser: 0.97 mg/dL (ref 0.50–1.35)
GFR calc Af Amer: >90 mL/min (ref >90)
Glucose, Bld: 218 mg/dL — ABNORMAL HIGH (ref 70–99)
Potassium: 3.5 mEq/L (ref 3.5–5.1)

## 2012-01-08 LAB — RAPID URINE DRUG SCREEN, HOSP PERFORMED
Amphetamines: NOT DETECTED
Barbiturates: NOT DETECTED
Benzodiazepines: NOT DETECTED
Tetrahydrocannabinol: NOT DETECTED

## 2012-01-08 LAB — CBC
HCT: 37.2 % — ABNORMAL LOW (ref 39.0–52.0)
MCH: 26.5 pg (ref 26.0–34.0)
MCHC: 33.1 g/dL (ref 30.0–36.0)
RDW: 15.2 % (ref 11.5–15.5)
WBC: 7 10*3/uL (ref 4.0–10.5)

## 2012-01-08 LAB — URINALYSIS, ROUTINE W REFLEX MICROSCOPIC
Ketones, ur: 40 mg/dL — AB
Nitrite: NEGATIVE
Protein, ur: 30 mg/dL — AB
pH: 5 (ref 5.0–8.0)

## 2012-01-08 LAB — ETHANOL: Alcohol, Ethyl (B): <11 mg/dL (ref 0–11)

## 2012-01-08 LAB — URINE MICROSCOPIC-ADD ON

## 2012-01-08 MED ORDER — SIMVASTATIN 40 MG PO TABS: 40.0000 mg | ORAL_TABLET | Freq: Every day | ORAL | Status: DC

## 2012-01-08 MED ORDER — METFORMIN HCL 500 MG PO TABS
1000.0000 mg | ORAL_TABLET | Freq: Two times a day (BID) | ORAL | Status: DC
  Filled 2012-01-08 (×2): qty 2

## 2012-01-08 MED ORDER — ASPIRIN EC 81 MG PO TBEC
81.0000 mg | DELAYED_RELEASE_TABLET | Freq: Every day | ORAL | Status: DC
  Administered 2012-01-08: 81 mg via ORAL
  Filled 2012-01-08 (×2): qty 1

## 2012-01-08 MED ORDER — PAROXETINE HCL 20 MG PO TABS
20.0000 mg | ORAL_TABLET | Freq: Every day | ORAL | Status: DC
  Filled 2012-01-08: qty 1

## 2012-01-08 MED ORDER — INDOMETHACIN 25 MG PO CAPS
25.0000 mg | ORAL_CAPSULE | Freq: Three times a day (TID) | ORAL | Status: DC | PRN
  Administered 2012-01-08: 25 mg via ORAL
  Filled 2012-01-08: qty 1

## 2012-01-08 MED ORDER — METOPROLOL SUCCINATE ER 25 MG PO TB24
25.0000 mg | ORAL_TABLET | Freq: Every day | ORAL | Status: DC
  Administered 2012-01-08: 25 mg via ORAL
  Filled 2012-01-08 (×2): qty 1

## 2012-01-08 MED ORDER — EMTRICITAB-RILPIVIR-TENOFOV DF 200-25-300 MG PO TABS
1.0000 | ORAL_TABLET | Freq: Every day | ORAL | Status: DC
  Filled 2012-01-08: qty 1

## 2012-01-08 MED ORDER — ALLOPURINOL 100 MG PO TABS
200.0000 mg | ORAL_TABLET | Freq: Every day | ORAL | Status: DC
  Administered 2012-01-08: 200 mg via ORAL
  Filled 2012-01-08 (×2): qty 2

## 2012-01-08 MED ORDER — GABAPENTIN 300 MG PO CAPS
300.0000 mg | ORAL_CAPSULE | Freq: Three times a day (TID) | ORAL | Status: DC
  Administered 2012-01-08: 300 mg via ORAL
  Filled 2012-01-08 (×4): qty 1

## 2012-01-08 MED ORDER — INSULIN GLARGINE 100 UNIT/ML ~~LOC~~ SOLN
30.0000 [IU] | Freq: Every day | SUBCUTANEOUS | Status: DC
  Administered 2012-01-08: 30 [IU] via SUBCUTANEOUS
  Filled 2012-01-08: qty 1

## 2012-01-08 MED ORDER — OXYCODONE HCL 10 MG PO TB12
10.0000 mg | ORAL_TABLET | Freq: Two times a day (BID) | ORAL | Status: DC
  Administered 2012-01-08: 10 mg via ORAL
  Filled 2012-01-08: qty 1

## 2012-01-08 MED ORDER — GLIMEPIRIDE 2 MG PO TABS
2.0000 mg | ORAL_TABLET | Freq: Three times a day (TID) | ORAL | Status: DC
  Administered 2012-01-08: 2 mg via ORAL
  Filled 2012-01-08 (×4): qty 1

## 2012-01-08 MED ORDER — PRAVASTATIN SODIUM 40 MG PO TABS
80.0000 mg | ORAL_TABLET | Freq: Every day | ORAL | Status: DC
  Filled 2012-01-08: qty 2

## 2012-01-08 MED ORDER — DILTIAZEM HCL ER BEADS 240 MG PO CP24
240.0000 mg | ORAL_CAPSULE | Freq: Every day | ORAL | Status: DC
Start: 2012-01-08 — End: 2012-01-09
  Administered 2012-01-08: 240 mg via ORAL
  Filled 2012-01-08 (×2): qty 1

## 2012-01-08 NOTE — ED Provider Notes (Signed)
History     CSN: [id number]  Arrival date & time 01/08/12  1843   First MD Initiated Contact with Patient 01/08/12 2011      Chief Complaint  Patient presents with  . Medical Clearance    (Consider location/radiation/quality/duration/timing/severity/associated sxs/prior treatment) Patient is a 61 y.o. male presenting with mental health disorder. The history is provided by the patient. No language interpreter was used.  Mental Health Problem The primary symptoms include delusions and hallucinations. The current episode started yesterday. This is a new problem.  The delusions began this week. The delusions appear to be have been unchanged since their onset.  The hallucinations began this week. The hallucinations appear to have been worsening since their onset. He has auditory hallucinations.  The onset of the illness is precipitated by a stressful event, emotional stress and drug abuse. The degree of incapacity that he is experiencing as a consequence of his illness is moderate. Additional symptoms of the illness do not include no appetite change, no fatigue, no headaches or no abdominal pain. He admits to suicidal ideas. He does have a plan to commit suicide. He contemplates harming himself. He has not already injured self. He does not contemplate injuring another person. He has not already  injured another person. Risk factors that are present for mental illness include substance abuse and a history of mental illness.    Past Medical History  Diagnosis Date  . Diabetes mellitus   . Hypertension   . Angina   . Shortness of breath   . Headache   . Gout   . Hyperlipidemia   . HIV infection   . Crack cocaine use     Past Surgical History  Procedure Date  . Back surgery   . Prostate surgery   . Bowel resection     Family History  Problem Relation Age of Onset  . Diabetes Mother   . Diabetes Father   . Diabetes Brother     History  Substance Use Topics  . Smoking status:  Never Smoker   . Smokeless tobacco: Never Used  . Alcohol Use: No      Review of Systems  Constitutional: Negative for fever, activity change, appetite change and fatigue.  HENT: Negative for congestion, sore throat, rhinorrhea, neck pain and neck stiffness.   Respiratory: Negative for cough and shortness of breath.   Cardiovascular: Negative for chest pain and palpitations.  Gastrointestinal: Negative for nausea, vomiting and abdominal pain.  Genitourinary: Negative for dysuria, urgency, frequency and flank pain.  Musculoskeletal: Negative for myalgias, back pain and arthralgias.  Neurological: Negative for dizziness, weakness, light-headedness, numbness and headaches.  Psychiatric/Behavioral: Positive for suicidal ideas and hallucinations. Negative for self-injury.  All other systems reviewed and are negative.    Allergies  Review of patient's allergies indicates no known allergies.  Home Medications   Current Outpatient Rx  Name Route Sig Dispense Refill  . ALLOPURINOL 100 MG PO TABS Oral Take 200 mg by mouth daily.    . ASPIRIN 81 MG PO TBEC Oral Take 1 tablet (81 mg total) by mouth daily. 31 tablet 0  . DILTIAZEM HCL ER BEADS 240 MG PO CP24 Oral Take 240 mg by mouth daily.      Marland Kitchen EMTRICITAB-RILPIVIR-TENOFOVIR 200-25-300 MG PO TABS Oral Take 1 tablet by mouth daily. 30 tablet 11  . GABAPENTIN 300 MG PO CAPS Oral Take 1 capsule (300 mg total) by mouth 3 (three) times daily. 90 capsule 6  . GLIMEPIRIDE 2 MG  PO TABS Oral Take 2 mg by mouth 3 (three) times daily.      . IBUPROFEN 400 MG PO TABS Oral Take 400 mg by mouth every 6 (six) hours as needed. For pain     . INDOMETHACIN 25 MG PO CAPS Oral Take 1 capsule (25 mg total) by mouth 3 (three) times daily as needed. 21 capsule 3  . INSULIN GLARGINE 100 UNIT/ML [place] SOLN Subcutaneous Inject 30 Units into the skin at bedtime.     Marland Kitchen METFORMIN HCL 1000 MG PO TABS Oral Take 1,000 mg by mouth 2 (two) times daily with a meal.    .  METOPROLOL SUCCINATE ER 25 MG PO TB24 Oral Take 25 mg by mouth daily.      . OXYCODONE HCL ER PO Oral Take 1 tablet by mouth every 6 (six) hours as needed. Pain.    Marland Kitchen PAROXETINE HCL 20 MG PO TABS Oral Take 1 tablet (20 mg total) by mouth every morning.    Marland Kitchen PRAVASTATIN SODIUM 80 MG PO TABS Oral Take 1 tablet (80 mg total) by mouth daily.      BP 110/81  Pulse 104  Temp(Src) 98.3 F (36.8 C) (Oral)  Resp 20  SpO2 100%  Physical Exam  Nursing note and vitals reviewed. Constitutional: He is oriented to person, place, and time. He appears well-developed and well-nourished.  HENT:  Head: Normocephalic and atraumatic.  Mouth/Throat: Oropharynx is clear and moist.  Eyes: Conjunctivae and EOM are normal. Pupils are equal, round, and reactive to light.  Neck: Normal range of motion. Neck supple.  Cardiovascular: Normal rate, regular rhythm, normal heart sounds and intact distal pulses.  Exam reveals no gallop and no friction rub.   No murmur heard. Pulmonary/Chest: Effort normal and breath sounds normal. No respiratory distress.  Abdominal: Soft. Bowel sounds are normal. There is no tenderness. There is no rebound and no guarding.  Musculoskeletal: Normal range of motion. He exhibits no edema and no tenderness.  Neurological: He is alert and oriented to person, place, and time. No cranial nerve deficit.  Skin: Skin is warm and dry.  Psychiatric: His mood appears not anxious. He is withdrawn and actively hallucinating. Thought content is paranoid and delusional. He exhibits a depressed mood. He expresses suicidal ideation. He expresses no homicidal ideation. He expresses suicidal plans. He expresses no homicidal plans.    ED Course  Procedures (including critical care time)  Labs Reviewed  CBC - Abnormal; Notable for the following:    Hemoglobin 12.3 (*)    HCT 37.2 (*)    All other components within normal limits  BASIC METABOLIC PANEL - Abnormal; Notable for the following:    Glucose,  Bld 218 (*)    GFR calc non Af Amer 87 (*)    All other components within normal limits  ETHANOL  URINE RAPID DRUG SCREEN (HOSP PERFORMED)  URINALYSIS, ROUTINE W REFLEX MICROSCOPIC   No results found.   1. Neuropathy   2. HIV (human immunodeficiency virus infection)   3. Diabetes mellitus   4. Psychosis       MDM  Medically clear for psychiatric evaluation however the patient does state he took crack cocaine approximately 3 hours ago. ACT team as well as specialist on call consult will be obtained. Patient exhibits psychosis with a history of similar in the past. Psych orders placed. His home meds were entered. Will await disposition per psychiatric recommendations        Trisha Mangle, MD 01/08/12  2044 

## 2012-01-08 NOTE — BH Assessment (Addendum)
Assessment Note   Michael [name] is a 61 y.o. male who presents to [facility] ED voluntarily, endorsing SI, auditory hallucinations, and SA. Pt reports he has been having thoughts of waniting to cut his wrists. Pt reports he has been having symptoms of depression since being diagnosed with HIV in January. Pt reports he has had a difficult time accepting the diagnosis and has little social support at this time. Pt reports he lives alone. Pt reports he started using crack cocaine since his diagnosis. He reports using 600 dollars worth of crack in the last 3 days. Pt reports he was clean for 4 years prior to last month. Pt also reports he has started hearing "mumbling voices" over the past week. Pt states voices have been "mumbling amongst themselves." Pt reports voices have become clearer over the last three days. Pt reports no prior history of mental health concerns. Pt reports no current therapist or psychiatrist. Pt reports no recent change in appetite, but reports weight loss over the last several months. Pt is unsure exact amount of weight lost. Pt also reports difficulty sleeping. Pt denies HI and visual hallucinations. Pt would like inpatient treatment for both substance abuse and depression. Axis I: Depressive Disorder NOS and Cocaine Dependence Axis II: Deferred Axis III:  Past Medical History  Diagnosis Date  . Diabetes mellitus   . Hypertension   . Angina   . Shortness of breath   . Headache   . Gout   . Hyperlipidemia   . HIV infection   . Crack cocaine use    Axis IV: other psychosocial or environmental problems, problems related to social environment and problems with primary support group Axis V: 31-40 impairment in reality testing  Past Medical History:  Past Medical History  Diagnosis Date  . Diabetes mellitus   . Hypertension   . Angina   . Shortness of breath   . Headache   . Gout   . Hyperlipidemia   . HIV infection   . Crack cocaine use     Past Surgical History    Procedure Date  . Back surgery   . Prostate surgery   . Bowel resection     Family History:  Family History  Problem Relation Age of Onset  . Diabetes Mother   . Diabetes Father   . Diabetes Brother     Social History:  reports that he has never smoked. He has never used smokeless tobacco. He reports that he uses illicit drugs (Cocaine). He reports that he does not drink alcohol.  Additional Social History:  Alcohol / Drug Use History of alcohol / drug use?: Yes Substance #1 Name of Substance 1: Crack/cocaine 1 - Age of First Use: 41 1 - Amount (size/oz): 200 dollars worth 1 - Frequency: daily 1 - Duration: currently 3 days, reports use off and on since age 48 1 - Last Use / Amount: 01/08/12 Allergies: No Known Allergies  Home Medications:  Medications Prior to Admission  Medication Dose Route Frequency Provider Last Rate Last Dose  . allopurinol (ZYLOPRIM) tablet 200 mg  200 mg Oral Daily Trisha Mangle, MD   200 mg at 01/08/12 2135  . aspirin EC tablet 81 mg  81 mg Oral Daily Trisha Mangle, MD   81 mg at 01/08/12 2135  . diltiazem (TIAZAC) 24 hr capsule 240 mg  240 mg Oral Daily Trisha Mangle, MD   240 mg at 01/08/12 2135  . Emtricitab-Rilpivir-Tenofovir 200-25-300 MG TABS 1 tablet  1 tablet  Oral Daily Trisha Mangle, MD      . gabapentin (NEURONTIN) capsule 300 mg  300 mg Oral TID Trisha Mangle, MD   300 mg at 01/08/12 2135  . glimepiride (AMARYL) tablet 2 mg  2 mg Oral TID Trisha Mangle, MD   2 mg at 01/08/12 2135  . indomethacin (INDOCIN) capsule 25 mg  25 mg Oral TID PRN Trisha Mangle, MD   25 mg at 01/08/12 2137  . insulin glargine (LANTUS) injection 30 Units  30 Units Subcutaneous QHS Trisha Mangle, MD   30 Units at 01/08/12 2140  . metFORMIN (GLUCOPHAGE) tablet 1,000 mg  1,000 mg Oral BID WC Trisha Mangle, MD      . metoprolol succinate (TOPROL-XL) 24 hr tablet 25 mg  25 mg Oral Daily Trisha Mangle, MD   25 mg at 01/08/12 2136  . oxyCODONE (OXYCONTIN) 12 hr tablet 10 mg  10 mg Oral Q12H  Trisha Mangle, MD   10 mg at 01/08/12 2136  . PARoxetine (PAXIL) tablet 20 mg  20 mg Oral Daily Trisha Mangle, MD      . pravastatin (PRAVACHOL) tablet 80 mg  80 mg Oral Daily Trisha Mangle, MD      . DISCONTD: simvastatin (ZOCOR) tablet 40 mg  40 mg Oral q1800 Trisha Mangle, MD       Medications Prior to Admission  Medication Sig Dispense Refill  . allopurinol (ZYLOPRIM) 100 MG tablet Take 200 mg by mouth daily.      Marland Kitchen aspirin EC 81 MG EC tablet Take 1 tablet (81 mg total) by mouth daily.  31 tablet  0  . diltiazem (TIAZAC) 240 MG 24 hr capsule Take 240 mg by mouth daily.        . Emtricitab-Rilpivir-Tenofovir (COMPLERA) 200-25-300 MG TABS Take 1 tablet by mouth daily.  30 tablet  11  . gabapentin (NEURONTIN) 300 MG capsule Take 1 capsule (300 mg total) by mouth 3 (three) times daily.  90 capsule  6  . glimepiride (AMARYL) 2 MG tablet Take 2 mg by mouth 3 (three) times daily.        Marland Kitchen ibuprofen (ADVIL,MOTRIN) 400 MG tablet Take 400 mg by mouth every 6 (six) hours as needed. For pain       . indomethacin (INDOCIN) 25 MG capsule Take 1 capsule (25 mg total) by mouth 3 (three) times daily as needed.  21 capsule  3  . insulin glargine (LANTUS) 100 UNIT/ML injection Inject 30 Units into the skin at bedtime.       . metFORMIN (GLUCOPHAGE) 1000 MG tablet Take 1,000 mg by mouth 2 (two) times daily with a meal.      . metoprolol succinate (TOPROL-XL) 25 MG 24 hr tablet Take 25 mg by mouth daily.        Marland Kitchen PARoxetine (PAXIL) 20 MG tablet Take 1 tablet (20 mg total) by mouth every morning.      . pravastatin (PRAVACHOL) 80 MG tablet Take 1 tablet (80 mg total) by mouth daily.        OB/GYN Status:  No LMP for male patient.  General Assessment Data Location of Assessment: [facility] ED Living Arrangements: Alone Can pt return to current living arrangement?: Yes Admission Status: Voluntary Is patient capable of signing voluntary admission?: Yes Transfer from: [facility] Referral Source:  Self/Family/Friend  Education Status Is patient currently in school?: No  Risk to self Suicidal Ideation: Yes-Currently Present Suicidal Intent: Yes-Currently Present Is patient at risk for suicide?: Yes Suicidal Plan?:  Yes-Currently Present Specify Current Suicidal Plan: cut wrists Access to Means: No What has been your use of drugs/alcohol within the last 12 months?: crack cocaine Previous Attempts/Gestures: No How many times?: 0  Other Self Harm Risks: none Triggers for Past Attempts: None known Intentional Self Injurious Behavior: None Family Suicide History: No Recent stressful life event(s): Other (Comment) (diagnosesd with HIV) Persecutory voices/beliefs?: No Depression: Yes Depression Symptoms: Feeling worthless/self pity;Loss of interest in usual pleasures;Insomnia;Despondent Substance abuse history and/or treatment for substance abuse?: Yes Suicide prevention information given to non-admitted patients: Not applicable  Risk to Others Homicidal Ideation: No Thoughts of Harm to Others: No Current Homicidal Intent: No Current Homicidal Plan: No Access to Homicidal Means: No Identified Victim: none History of harm to others?: No Assessment of Violence: None Noted Violent Behavior Description: none Does patient have access to weapons?: No Criminal Charges Pending?: No Does patient have a court date: No  Psychosis Hallucinations: Auditory Delusions: None noted  Mental Status Report Appear/Hygiene: Disheveled Eye Contact: Fair Motor Activity: Unremarkable Speech: Logical/coherent Level of Consciousness: Alert Mood: Depressed Affect: Appropriate to circumstance Anxiety Level: None Thought Processes: Coherent;Relevant Judgement: Impaired Orientation: Person;Place;Time;Situation Obsessive Compulsive Thoughts/Behaviors: None  Cognitive Functioning Concentration: Normal Memory: Recent Intact;Remote Intact IQ: Average Insight: Fair Impulse Control:  Fair Appetite: Fair Sleep: Decreased Vegetative Symptoms: None  Prior Inpatient Therapy Prior Inpatient Therapy: No Prior Therapy Dates: n/a Prior Therapy Facilty/Provider(s): n/a Reason for Treatment: n/a  Prior Outpatient Therapy Prior Outpatient Therapy: No Prior Therapy Dates: n/a Prior Therapy Facilty/Provider(s): n/a Reason for Treatment: n/a  ADL Screening (condition at time of admission) Patient's cognitive ability adequate to safely complete daily activities?: Yes Patient able to express need for assistance with ADLs?: Yes Independently performs ADLs?: Yes  Home Assistive Devices/Equipment Home Assistive Devices/Equipment: None    Abuse/Neglect Assessment (Assessment to be complete while patient is alone) Physical Abuse: Denies Verbal Abuse: Denies Sexual Abuse: Denies Exploitation of patient/patient's resources: Denies Self-Neglect: Denies Values / Beliefs Cultural Requests During Hospitalization: None Spiritual Requests During Hospitalization: None   Advance Directives (For Healthcare) Advance Directive: Patient does not have advance directive Nutrition Screen Home Tube Feeding or Total Parenteral Nutrition (TPN): No Patient appears severely malnourished: No  Additional Information 1:1 In Past 12 Months?: No CIRT Risk: No Elopement Risk: No Does patient have medical clearance?: Yes     Disposition:  Disposition Disposition of Patient: Referred to;Inpatient treatment program Type of inpatient treatment program: Adult Pt was accepted to [facility] by Dr Francena Hanly to Dr Dell Ponto, Rm [id number]. On Site Evaluation by:   Reviewed with Physician:     Kaylyn Layer A 01/08/2012 10:50 PM

## 2012-01-08 NOTE — ED Notes (Signed)
Pt states he has a drug problem  Pt states he has been using crack cocaine  Last used 2 hrs ago   Pt is calm and cooperative in triage   Poor eye contact  Pt states he has had suicidal thoughts with plan of cutting his wrist or walking out in traffic  Pt states he has been having audible hallucinations states the voices are muffled and he cannot make out what they are telling him

## 2012-01-08 NOTE — ED Notes (Signed)
Pt instructed to change into blue scrubs and made aware of the need for urine sample.

## 2012-01-09 ENCOUNTER — Encounter (HOSPITAL_COMMUNITY): Payer: Self-pay | Admitting: *Deleted

## 2012-01-09 ENCOUNTER — Inpatient Hospital Stay (HOSPITAL_COMMUNITY)
Admission: EM | Admit: 2012-01-09 | Discharge: 2012-01-15 | DRG: 885 | Disposition: A | Discharge status: Rehabilitation Facility | Payer: 59 | Source: Ambulatory Visit | Attending: Psychiatry | Admitting: Psychiatry

## 2012-01-09 DIAGNOSIS — F1420 Cocaine dependence, uncomplicated: Secondary | ICD-10-CM | POA: Diagnosis present

## 2012-01-09 DIAGNOSIS — Z794 Long term (current) use of insulin: Secondary | ICD-10-CM

## 2012-01-09 DIAGNOSIS — Z21 Asymptomatic human immunodeficiency virus [HIV] infection status: Secondary | ICD-10-CM

## 2012-01-09 DIAGNOSIS — F1410 Cocaine abuse, uncomplicated: Secondary | ICD-10-CM | POA: Diagnosis present

## 2012-01-09 DIAGNOSIS — Z79899 Other long term (current) drug therapy: Secondary | ICD-10-CM

## 2012-01-09 DIAGNOSIS — F411 Generalized anxiety disorder: Secondary | ICD-10-CM | POA: Diagnosis present

## 2012-01-09 DIAGNOSIS — G629 Polyneuropathy, unspecified: Secondary | ICD-10-CM

## 2012-01-09 DIAGNOSIS — M109 Gout, unspecified: Secondary | ICD-10-CM | POA: Diagnosis present

## 2012-01-09 DIAGNOSIS — R45851 Suicidal ideations: Secondary | ICD-10-CM

## 2012-01-09 DIAGNOSIS — F339 Major depressive disorder, recurrent, unspecified: Principal | ICD-10-CM | POA: Diagnosis present

## 2012-01-09 DIAGNOSIS — E1169 Type 2 diabetes mellitus with other specified complication: Secondary | ICD-10-CM | POA: Diagnosis not present

## 2012-01-09 DIAGNOSIS — E785 Hyperlipidemia, unspecified: Secondary | ICD-10-CM | POA: Diagnosis present

## 2012-01-09 DIAGNOSIS — F329 Major depressive disorder, single episode, unspecified: Secondary | ICD-10-CM | POA: Diagnosis present

## 2012-01-09 DIAGNOSIS — B20 Human immunodeficiency virus [HIV] disease: Secondary | ICD-10-CM

## 2012-01-09 DIAGNOSIS — G4700 Insomnia, unspecified: Secondary | ICD-10-CM | POA: Diagnosis present

## 2012-01-09 DIAGNOSIS — I10 Essential (primary) hypertension: Secondary | ICD-10-CM | POA: Diagnosis present

## 2012-01-09 DIAGNOSIS — Z7982 Long term (current) use of aspirin: Secondary | ICD-10-CM

## 2012-01-09 LAB — GLUCOSE, CAPILLARY

## 2012-01-09 MED ORDER — EMTRICITAB-RILPIVIR-TENOFOV DF 200-25-300 MG PO TABS
1.0000 | ORAL_TABLET | Freq: Every day | ORAL | Status: DC
  Administered 2012-01-09 – 2012-01-15 (×7): 1 via ORAL
  Filled 2012-01-09 (×8): qty 1

## 2012-01-09 MED ORDER — INSULIN GLARGINE 100 UNIT/ML ~~LOC~~ SOLN
30.0000 [IU] | Freq: Every day | SUBCUTANEOUS | Status: DC
  Administered 2012-01-09: 100 [IU] via SUBCUTANEOUS
  Administered 2012-01-10: 30 [IU] via SUBCUTANEOUS

## 2012-01-09 MED ORDER — DILTIAZEM HCL ER BEADS 240 MG PO CP24
240.0000 mg | ORAL_CAPSULE | Freq: Every day | ORAL | Status: DC
  Filled 2012-01-09 (×2): qty 1

## 2012-01-09 MED ORDER — ALLOPURINOL 100 MG PO TABS
200.0000 mg | ORAL_TABLET | Freq: Every day | ORAL | Status: DC
  Administered 2012-01-09 – 2012-01-12 (×4): 200 mg via ORAL
  Filled 2012-01-09 (×6): qty 2

## 2012-01-09 MED ORDER — METOPROLOL TARTRATE 25 MG PO TABS
25.0000 mg | ORAL_TABLET | Freq: Every day | ORAL | Status: DC
  Administered 2012-01-09 – 2012-01-10 (×2): 25 mg via ORAL
  Filled 2012-01-09 (×3): qty 1

## 2012-01-09 MED ORDER — GABAPENTIN 300 MG PO CAPS
300.0000 mg | ORAL_CAPSULE | Freq: Three times a day (TID) | ORAL | Status: DC
  Administered 2012-01-09 – 2012-01-12 (×12): 300 mg via ORAL
  Filled 2012-01-09 (×16): qty 1

## 2012-01-09 MED ORDER — METFORMIN HCL 500 MG PO TABS
1000.0000 mg | ORAL_TABLET | Freq: Two times a day (BID) | ORAL | Status: DC
  Administered 2012-01-09 – 2012-01-15 (×12): 1000 mg via ORAL
  Filled 2012-01-09 (×14): qty 2

## 2012-01-09 MED ORDER — METOPROLOL SUCCINATE ER 25 MG PO TB24
25.0000 mg | ORAL_TABLET | Freq: Every day | ORAL | Status: DC
  Administered 2012-01-09 – 2012-01-15 (×7): 25 mg via ORAL
  Filled 2012-01-09 (×9): qty 1

## 2012-01-09 MED ORDER — ALUM & MAG HYDROXIDE-SIMETH 200-200-20 MG/5ML PO SUSP: 30.0000 mL | ORAL | Status: DC | PRN

## 2012-01-09 MED ORDER — MAGNESIUM HYDROXIDE 400 MG/5ML PO SUSP: 30.0000 mL | Freq: Every day | ORAL | Status: DC | PRN

## 2012-01-09 MED ORDER — INDOMETHACIN 25 MG PO CAPS
25.0000 mg | ORAL_CAPSULE | Freq: Three times a day (TID) | ORAL | Status: DC | PRN
  Filled 2012-01-09: qty 1
  Filled 2012-01-09: qty 42

## 2012-01-09 MED ORDER — INDOMETHACIN 25 MG PO CAPS
25.0000 mg | ORAL_CAPSULE | Freq: Three times a day (TID) | ORAL | Status: DC | PRN
  Filled 2012-01-09: qty 1

## 2012-01-09 MED ORDER — DILTIAZEM HCL ER COATED BEADS 240 MG PO CP24
240.0000 mg | ORAL_CAPSULE | Freq: Every day | ORAL | Status: DC
  Administered 2012-01-09 – 2012-01-12 (×4): 240 mg via ORAL
  Filled 2012-01-09 (×6): qty 1

## 2012-01-09 MED ORDER — ASPIRIN EC 81 MG PO TBEC
81.0000 mg | DELAYED_RELEASE_TABLET | Freq: Every day | ORAL | Status: DC
  Administered 2012-01-09 – 2012-01-12 (×4): 81 mg via ORAL
  Filled 2012-01-09 (×6): qty 1

## 2012-01-09 MED ORDER — INSULIN GLARGINE 100 UNIT/ML ~~LOC~~ SOLN: 30.0000 [IU] | Freq: Every day | SUBCUTANEOUS | Status: DC

## 2012-01-09 MED ORDER — SIMVASTATIN 40 MG PO TABS
40.0000 mg | ORAL_TABLET | Freq: Every day | ORAL | Status: DC
  Administered 2012-01-09 – 2012-01-14 (×6): 40 mg via ORAL
  Filled 2012-01-09 (×7): qty 1

## 2012-01-09 MED ORDER — GLIMEPIRIDE 2 MG PO TABS
2.0000 mg | ORAL_TABLET | Freq: Three times a day (TID) | ORAL | Status: DC
  Administered 2012-01-09 – 2012-01-12 (×12): 2 mg via ORAL
  Filled 2012-01-09 (×19): qty 1

## 2012-01-09 MED ORDER — PAROXETINE HCL 20 MG PO TABS
20.0000 mg | ORAL_TABLET | Freq: Every day | ORAL | Status: DC
  Administered 2012-01-09: 20 mg via ORAL
  Filled 2012-01-09 (×3): qty 1

## 2012-01-09 MED ORDER — ACETAMINOPHEN 325 MG PO TABS: 650.0000 mg | ORAL_TABLET | Freq: Four times a day (QID) | ORAL | Status: DC | PRN

## 2012-01-09 MED ORDER — IBUPROFEN 200 MG PO TABS
400.0000 mg | ORAL_TABLET | Freq: Four times a day (QID) | ORAL | Status: DC | PRN
  Administered 2012-01-15: 400 mg via ORAL
  Filled 2012-01-09 (×2): qty 2

## 2012-01-09 MED ORDER — METFORMIN HCL 500 MG PO TABS
1000.0000 mg | ORAL_TABLET | Freq: Two times a day (BID) | ORAL | Status: DC
  Administered 2012-01-09: 1000 mg via ORAL
  Filled 2012-01-09 (×5): qty 2

## 2012-01-09 MED ORDER — DULOXETINE HCL 30 MG PO CPEP
30.0000 mg | ORAL_CAPSULE | Freq: Every day | ORAL | Status: DC
  Administered 2012-01-09 – 2012-01-15 (×7): 30 mg via ORAL
  Filled 2012-01-09 (×9): qty 1

## 2012-01-09 MED ORDER — ACETAMINOPHEN 325 MG PO TABS
650.0000 mg | ORAL_TABLET | Freq: Four times a day (QID) | ORAL | Status: DC | PRN
  Administered 2012-01-09 – 2012-01-14 (×2): 650 mg via ORAL

## 2012-01-09 NOTE — Tx Team (Addendum)
Initial Interdisciplinary Treatment Plan  PATIENT STRENGTHS: (choose at least two) Ability for insight Average or above average intelligence Capable of independent living Communication skills General fund of knowledge Motivation for treatment/growth Physical Health Religious Affiliation Special hobby/interest Supportive family/friends  PATIENT STRESSORS: Financial difficulties Substance abuse "Being by myself and the situation I'm in" , referring to HIV dx   PROBLEM LIST: Problem List/Patient Goals Date to be addressed Date deferred Reason deferred Estimated date of resolution  "I want to deal with my drug problem" 01/09/12           "I would like long term treatment 01/09/12             Depression 01/09/12     Increased risk for suicide 01/09/12     Auditory hallucinations 01/09/12     Substance abuse 01/09/12            DISCHARGE CRITERIA:  Ability to meet basic life and health needs Adequate post-discharge living arrangements Improved stabilization in mood, thinking, and/or behavior Medical problems require only outpatient monitoring Motivation to continue treatment in a less acute level of care Need for constant or close observation no longer present Reduction of life-threatening or endangering symptoms to within safe limits Safe-care adequate arrangements made Verbal commitment to aftercare and medication compliance Withdrawal symptoms are absent or subacute and managed without 24-hour nursing intervention  PRELIMINARY DISCHARGE PLAN: Attend aftercare/continuing care group Attend 12-step recovery group Outpatient therapy Return to previous living arrangement  PATIENT/FAMIILY INVOLVEMENT: This treatment plan has been presented to and reviewed with the patient, Michael [name], and/or family member.  The patient and family have been given the opportunity to ask questions and make suggestions.  Michael [name] 01/09/2012, 2:54 AM

## 2012-01-09 NOTE — ED Notes (Signed)
Report called to Maudie Mercury at [facility] and pt agrees to transport. Departs unit in safe and stable condition.

## 2012-01-09 NOTE — Progress Notes (Signed)
Patient up and interacting with peers and staff.  Attending groups.  Tolerating medications well.  Rates depression at a 10 and admits to having some suicidal thoughts, but agrees to seek out staff if feeling unsafe.

## 2012-01-09 NOTE — Progress Notes (Signed)
Recreation Therapy Notes  01/09/2012         Time: 1415      Group Topic/Focus: The focus of this group is on enhancing the patient's understanding of leisure, barriers to leisure, and the importance of engaging in positive leisure activities upon discharge for improved total health.  Participation Level: Active  Participation Quality: Appropriate and Attentive  Affect: Appropriate  Cognitive: Oriented   Additional Comments: Patient able to identify positive leisure interests he can engage in during his leisure time.   Keyion Knack 01/09/2012 3:04 PM

## 2012-01-09 NOTE — Progress Notes (Signed)
Cosigned by Sheilah Pigeon, LCSWA 4/4/20138:16 PM

## 2012-01-09 NOTE — Progress Notes (Signed)
[place] Group Notes:  (Counselor/Nursing/MHT/Case Management/Adjunct)  01/09/2012 8:16 PM  Type of Therapy:  Group Therapy at 11:00 AM  Participation Level:  Did Not Attend   Lyla Glassing 01/09/2012, 8:16 PM

## 2012-01-09 NOTE — H&P (Signed)
Psychiatric Admission Assessment Adult  Patient Identification:  Michael [name] Date of Evaluation:  01/09/2012 Chief Complaint:  Depressive Disorder History of Present Illness:  Pt. Presents to the ED complaining of depression and now auditory hallucinations after binging on crack.  He states the voices are a mumble that he can't make them out clearly.  It has happened twice both times while he was sleeping, that woke him up.  He notes he has had significant depression since he has been diagnosed with HIV in January of this year.  He has been binging on crack for several days using 500$ in 3 days.  He had been clean for 4 years until his relapse. Psychiatric Symptoms:  SI, with plans to cut his wrists, but only since using crack. NO previous attempts. \ Past Psychiatric History: Past Medical History:   Past Medical History  Diagnosis Date  . Diabetes mellitus   . Hypertension   . Angina   . Shortness of breath   . Headache   . Gout   . Hyperlipidemia   . HIV infection   . Crack cocaine use    Allergies:  No Known Allergies  PTA Medications: Prescriptions prior to admission  Medication Sig Dispense Refill  . Emtricitab-Rilpivir-Tenofovir (COMPLERA) 200-25-300 MG TABS Take 1 tablet by mouth daily.  30 tablet  11  . allopurinol (ZYLOPRIM) 100 MG tablet Take 200 mg by mouth daily.      Marland Kitchen aspirin EC 81 MG EC tablet Take 1 tablet (81 mg total) by mouth daily.  31 tablet  0  . diltiazem (TIAZAC) 240 MG 24 hr capsule Take 240 mg by mouth daily.        Marland Kitchen gabapentin (NEURONTIN) 300 MG capsule Take 1 capsule (300 mg total) by mouth 3 (three) times daily.  90 capsule  6  . glimepiride (AMARYL) 2 MG tablet Take 2 mg by mouth 3 (three) times daily.        Marland Kitchen ibuprofen (ADVIL,MOTRIN) 400 MG tablet Take 400 mg by mouth every 6 (six) hours as needed. For pain       . indomethacin (INDOCIN) 25 MG capsule Take 1 capsule (25 mg total) by mouth 3 (three) times daily as needed.  21 capsule  3  .  insulin glargine (LANTUS) 100 UNIT/ML injection Inject 30 Units into the skin at bedtime.       . metFORMIN (GLUCOPHAGE) 1000 MG tablet Take 1,000 mg by mouth 2 (two) times daily with a meal.      . metoprolol succinate (TOPROL-XL) 25 MG 24 hr tablet Take 25 mg by mouth daily.        . OXYCODONE HCL ER PO Take 1 tablet by mouth every 6 (six) hours as needed. Pain.      Marland Kitchen PARoxetine (PAXIL) 20 MG tablet Take 1 tablet (20 mg total) by mouth every morning.      . pravastatin (PRAVACHOL) 80 MG tablet Take 1 tablet (80 mg total) by mouth daily.        Previous Psychotropic Medications: ProZac, Zoloft  Substance Abuse History in the last 12 months:  See HPI  Social History: Current Place of Residence:   Place of Birth:   Employment: Marital Status:  Single Children: Education:  [employer] Military History:  None Legal History: Family History:   Family History  Problem Relation Age of Onset  . Diabetes Mother   . Diabetes Father   . Diabetes Brother     ROS: As noted  in the HPI. PE: Completed in ED. Pt evaluated and results reviewed.  Mental Status Examination/Evaluation: Appearance: casual  Eye Contact::  good  Speech:  normal  Volume:  norma  Mood:  anxious  Affect:  congruent  Thought Process:  linear  Orientation:  x3  Thought Content:  linear  Suicidal Thoughts:  SI with plans to cut wrists  Homicidal Thoughts:  no  Memory:  fair  Judgement:  poor  Insight:  lacking  Psychomotor Activity:  normal  Concentration:  fair  Recall:  fair  Akathisia:  no  Handed:  right  AIMS (if indicated):     Assets:    Sleep:  Number of Hours: 2    Labs:Results for ANDRIY, PURSER (MRN [id number]) as of 01/09/2012 22:58  Ref. Range 01/08/2012 20:39  Color, Urine Latest Range: YELLOW  YELLOW  APPearance Latest Range: CLEAR  CLEAR  Specific Gravity, Urine Latest Range: 1.005-1.030  1.036 (H)  pH Latest Range: 5.0-8.0  5.0  Glucose, UA Latest Range: NEGATIVE mg/dL >1000 (A)  Bilirubin  Urine Latest Range: NEGATIVE  SMALL (A)  Ketones, ur Latest Range: NEGATIVE mg/dL 40 (A)  Protein Latest Range: NEGATIVE mg/dL 30 (A)  Urobilinogen, UA Latest Range: 0.0-1.0 mg/dL 1.0  Nitrite Latest Range: NEGATIVE  NEGATIVE  Leukocytes, UA Latest Range: NEGATIVE  NEGATIVE  Hgb urine dipstick Latest Range: NEGATIVE  TRACE (A)  Urine-Other No range found SPERM PRESENT  WBC, UA Latest Range: <3 WBC/hpf 0-2  Squamous Epithelial / LPF Latest Range: RARE  RARE   Xray: Assessment:    AXIS I:  Cocaine dependence continuous use, SIMDO AXIS II:  AXIS III:   Past Medical History  Diagnosis Date  . Diabetes mellitus   . Hypertension   . Angina   . Shortness of breath   . Headache   . Gout   . Hyperlipidemia   . HIV infection   . Crack cocaine use    AXIS IV:  Problems w/primary support group AXIS V: 40 GAF: last year unknown   Treatment Plan/Recommendations: Admit for crisis stabilization and supportive care to include detox protocol for polysubstance dependence as needed. Evaluation and treatment for medical problems associated with current state of health.  Treatment Plan Summary:  Current Medications:  Current Facility-Administered Medications  Medication Dose Route Frequency Provider Last Rate Last Dose  . acetaminophen (TYLENOL) tablet 650 mg  650 mg Oral Q6H PRN Jimmye Norman, PA-C      . allopurinol (ZYLOPRIM) tablet 200 mg  200 mg Oral Daily Jimmye Norman, PA-C   200 mg at 01/09/12 1028  . alum & mag hydroxide-simeth (MAALOX/MYLANTA) 200-200-20 MG/5ML suspension 30 mL  30 mL Oral Q4H PRN Jimmye Norman, PA-C      . aspirin EC tablet 81 mg  81 mg Oral Daily Jimmye Norman, PA-C   81 mg at 01/09/12 1028  . diltiazem (CARDIZEM CD) 24 hr capsule 240 mg  240 mg Oral Daily Jimmye Norman, PA-C   240 mg at 01/09/12 1029  . Emtricitab-Rilpivir-Tenofovir 200-25-300 MG TABS 1 tablet  1 tablet Oral Daily Jimmye Norman, PA-C   1 tablet at 01/09/12 1029  . gabapentin (NEURONTIN) capsule 300 mg  300 mg Oral TID  Jimmye Norman, PA-C   300 mg at 01/09/12 1029  . glimepiride (AMARYL) tablet 2 mg  2 mg Oral TID Jimmye Norman, PA-C   2 mg at 01/09/12 1029  . ibuprofen (ADVIL,MOTRIN) tablet 400 mg  400 mg Oral Q6H PRN Jimmye Norman,  PA-C      . indomethacin (INDOCIN) capsule 25 mg  25 mg Oral TID PRN Jimmye Norman, PA-C      . indomethacin (INDOCIN) capsule 25 mg  25 mg Oral TID PRN Mellissa Kohut, MD      . insulin glargine (LANTUS) injection 30 Units  30 Units Subcutaneous QHS Jimmye Norman, PA-C      . magnesium hydroxide (MILK OF MAGNESIA) suspension 30 mL  30 mL Oral Daily PRN Jimmye Norman, PA-C      . metFORMIN (GLUCOPHAGE) tablet 1,000 mg  1,000 mg Oral BID WC Jimmye Norman, PA-C   1,000 mg at 01/09/12 [id number]  . metoprolol tartrate (LOPRESSOR) tablet 25 mg  25 mg Oral Daily Jimmye Norman, PA-C   25 mg at 01/09/12 1029  . PARoxetine (PAXIL) tablet 20 mg  20 mg Oral Daily Jimmye Norman, PA-C   20 mg at 01/09/12 1029  . simvastatin (ZOCOR) tablet 40 mg  40 mg Oral q1800 Jimmye Norman, PA-C      . DISCONTD: acetaminophen (TYLENOL) tablet 650 mg  650 mg Oral Q6H PRN Jimmye Norman, PA-C      . DISCONTD: alum & mag hydroxide-simeth (MAALOX/MYLANTA) 200-200-20 MG/5ML suspension 30 mL  30 mL Oral Q4H PRN Jimmye Norman, PA-C      . DISCONTD: magnesium hydroxide (MILK OF MAGNESIA) suspension 30 mL  30 mL Oral Daily PRN Jimmye Norman, PA-C       Facility-Administered Medications Ordered in Other Encounters  Medication Dose Route Frequency Provider Last Rate Last Dose  . DISCONTD: allopurinol (ZYLOPRIM) tablet 200 mg  200 mg Oral Daily Trisha Mangle, MD   200 mg at 01/08/12 2135  . DISCONTD: aspirin EC tablet 81 mg  81 mg Oral Daily Trisha Mangle, MD   81 mg at 01/08/12 2135  . DISCONTD: diltiazem (TIAZAC) 24 hr capsule 240 mg  240 mg Oral Daily Trisha Mangle, MD   240 mg at 01/08/12 2135  . DISCONTD: Emtricitab-Rilpivir-Tenofovir 200-25-300 MG TABS 1 tablet  1 tablet Oral Daily Trisha Mangle, MD      . DISCONTD: gabapentin (NEURONTIN) capsule 300 mg  300 mg Oral TID  Trisha Mangle, MD   300 mg at 01/08/12 2135  . DISCONTD: glimepiride (AMARYL) tablet 2 mg  2 mg Oral TID Trisha Mangle, MD   2 mg at 01/08/12 2135  . DISCONTD: indomethacin (INDOCIN) capsule 25 mg  25 mg Oral TID PRN Trisha Mangle, MD   25 mg at 01/08/12 2137  . DISCONTD: insulin glargine (LANTUS) injection 30 Units  30 Units Subcutaneous QHS Trisha Mangle, MD   30 Units at 01/08/12 2140  . DISCONTD: metFORMIN (GLUCOPHAGE) tablet 1,000 mg  1,000 mg Oral BID WC Trisha Mangle, MD      . DISCONTD: metoprolol succinate (TOPROL-XL) 24 hr tablet 25 mg  25 mg Oral Daily Trisha Mangle, MD   25 mg at 01/08/12 2136  . DISCONTD: oxyCODONE (OXYCONTIN) 12 hr tablet 10 mg  10 mg Oral Q12H Trisha Mangle, MD   10 mg at 01/08/12 2136  . DISCONTD: PARoxetine (PAXIL) tablet 20 mg  20 mg Oral Daily Trisha Mangle, MD      . DISCONTD: pravastatin (PRAVACHOL) tablet 80 mg  80 mg Oral Daily John L Molpus, MD      . DISCONTD: simvastatin (ZOCOR) tablet 40 mg  40 mg Oral q1800 Trisha Mangle, MD        Observation Level/Precautions:  routine  Laboratory:  Routine PRN Medications: yes  Consultations:    Discharge Concerns:    Other:      Milta Deiters T. Rockey Guarino PAC For Dr. Cheryll Cockayne  4/4/20134:25 PM

## 2012-01-09 NOTE — Discharge Planning (Signed)
Pt present in morning discharge planning group. Pt states that he decided to admit himself to the hospital to detox from cocaine. Pt states that he was able to maintain a sobriety period of 4 years from cocaine before this relapse. Pt attributes this to going to NA meetings and living in a positive and stable environment in [facility]. Pt states that when he moved to [place] things went downhill for him because he is surrounded by drug dealers in the apartment complex he lives in and drugs are easily accessible for him. Pt states that he would like to find a safe place to live after going to a treatment program. Pt denies any SI/HI and is able to contract for safety if at any point he starts to have any SI/HI. He ranks his depression and anxiety at a 10. Pt is scheduled to f/u with [facility] on 4/17. No further needs for pt at this time.

## 2012-01-09 NOTE — Progress Notes (Signed)
Patient ID: Michael [name], male   DOB: 1951-03-07, 61 y.o.   MRN: [id number] Patient was pleasant and cooperative during the assessment. Was sad and depressed, stating that he doesn't how to deal with it. Referring to his diagnosis of HIV in Dec 2012.  Stated during the Dr's visit he was informed that he's probably had the disease for 12-15 yrs. Pt started using crack at 41 yrs, was sober for 4 yrs then recently moved from [place] to [facility]. Stated that there are several drug dealers that live close to him, and the use started again. Pt admits to suicidal thoughts, but contracts for safety. Support and encouragement was offered.

## 2012-01-09 NOTE — Progress Notes (Signed)
[facility] Group Notes:  (Counselor/Nursing/MHT/Case Management/Adjunct)  01/09/2012 4:06 PM  Type of Therapy:  1:15PM Group Therapy  Participation Level:  Minimal  Participation Quality:  Appropriate and Attentive  Affect:  Appropriate  Cognitive:  Appropriate  Insight:  Limited  Engagement in Group:  Limited  Engagement in Therapy:  Limited  Modes of Intervention:  Activity, Clarification, Education, Support and Exploration  Summary of Progress/Problems: In discussing balance, patient chose a photo of a broken bicycle to represent his life when it was imbalanced. Patient stated the bicycle describes himself and what he's dealing with in his life. Patient reported "I deal with physical issues in my body and I sometimes wonder which one is going to take me out. Patient also chose a photo of a tunnel with light. Patient states "I am trying to make it to the end of the tunnel". Patient seemed to relate well to his peers in discussing life and balance.  Frederich Cha 01/09/2012, 4:06 PM

## 2012-01-10 ENCOUNTER — Telehealth: Payer: Self-pay | Admitting: *Deleted

## 2012-01-10 DIAGNOSIS — F1420 Cocaine dependence, uncomplicated: Secondary | ICD-10-CM

## 2012-01-10 DIAGNOSIS — F339 Major depressive disorder, recurrent, unspecified: Principal | ICD-10-CM

## 2012-01-10 DIAGNOSIS — R45851 Suicidal ideations: Secondary | ICD-10-CM

## 2012-01-10 DIAGNOSIS — F1410 Cocaine abuse, uncomplicated: Secondary | ICD-10-CM | POA: Diagnosis present

## 2012-01-10 LAB — GLUCOSE, CAPILLARY
Glucose-Capillary: 73 mg/dL (ref 70–99)
Glucose-Capillary: 138 mg/dL — ABNORMAL HIGH (ref 70–99)
Glucose-Capillary: 95 mg/dL (ref 70–99)
Glucose-Capillary: 142 mg/dL — ABNORMAL HIGH (ref 70–99)
Glucose-Capillary: 98 mg/dL (ref 70–99)

## 2012-01-10 NOTE — Telephone Encounter (Signed)
Michael [name]'s sister called and said he was in the hospital at [facility]. She said he wasn't feeling well and they had decided to keep him overnight but she didn't know what was wrong with him. She had siad he was having a nurse come out twice a week now. He has been assigned to [facility].

## 2012-01-10 NOTE — Progress Notes (Signed)
[facility] Group Notes:  (Counselor/Nursing/MHT/Case Management/Adjunct)  01/10/2012 6:23 PM  Type of Therapy:  Group Therapy  Participation Level:  Minimal  Participation Quality:  Appropriate and Attentive  Affect:  Depressed  Cognitive:  Alert and Oriented  Insight:  None shared  Engagement in Group:  Limited  Engagement in Therapy:  Limited  Modes of Intervention:  Education, Socialization and Support  Summary of Progress/Problems:  Eunice was attentive to presentation on Post Acute Withdrawal Syndrome yet remained quiet during group discussion.    Lyla Glassing 01/10/2012, 6:23 PM

## 2012-01-10 NOTE — [facility] Suicide Risk Assessment (Signed)
Suicide Risk Assessment  Admission Assessment     Demographic factors:  Assessment Details Time of Assessment: Admission Information Obtained From: Patient Current Mental Status:  Current Mental Status: Suicidal ideation indicated by patient;Suicide plan;Belief that plan would result in death Loss Factors:  Loss Factors: Financial problems / change in socioeconomic status;Loss of significant relationship (divorced for 20 yrs causes stress) Historical Factors:    Risk Reduction Factors:     CLINICAL FACTORS:   Severe Anxiety and/or Agitation Depression:   Anhedonia Hopelessness Insomnia Severe Dysthymia Alcohol/Substance Abuse/Dependencies More than one psychiatric diagnosis Previous Psychiatric Diagnoses and Treatments  COGNITIVE FEATURES THAT CONTRIBUTE TO RISK:  Polarized thinking    SUICIDE RISK:   Mild:  Suicidal ideation of limited frequency, intensity, duration, and specificity.  There are no identifiable plans, no associated intent, mild dysphoria and related symptoms, good self-control (both objective and subjective assessment), few other risk factors, and identifiable protective factors, including available and accessible social support.  PLAN OF CARE: Pt had met a GF who he later learned she was using cocaine.  He be 600.00 He began hearing muffled voices.  He thought he could not manage it anymore and thought of killing himself   It had happened the week before but the 'voices' did not last as long.  It has been years since he was in rehab.  He thinks he needs to be in a long term rehab program.  gan using Crack cocaine with her.   He does not smoke cigarettes. He denies use of alcohol; occasional beer.  He is on disability from work injury and lives alone.  He has 2 children in [place] and [place].  For the 3 days prior to admission he had been smoking Crack $He recently moved to [facility] from [facility] because [place] 'has more benefits'  Yet he also learned  that there are several drug dealers in his building.Michael [name] just learned that he is HIV+ in December and does not know how long he has been infected.  He has not had any AIDS symptoms.  He has begun taking HIV medication.   Plan is to involve pt in group and individual therapy, monitor suicidal, homicidal thoughts, [mood is 10/10 worst today] psycho-education about drugs and relapse prevention, plans for community participation in [facility] and therapeutic supervision and referral to HIV program resources. Michael [name]  Michael [name] 01/10/2012, 10:47 AM

## 2012-01-10 NOTE — Progress Notes (Signed)
Pt up and positive for group tonight, no complaints voiced.  Bright on approach.  Denies SI/HI/hallucinations.  Support and encouragement offered, will continue to monitor.

## 2012-01-10 NOTE — Progress Notes (Signed)
Pt states that he continues to feel depressed and hopeless, rating both a 9. Denies SI and HI. Feels hopeless like his addiction will never stop. States that he was aoberfor 4 years and then he fell off the wagon. Wants soberity very much.

## 2012-01-10 NOTE — Discharge Planning (Signed)
Pt was present in d/c planning group this morning. Pt states that he is not feeling well due to a drop in his blood sugar but has notified the nurse who is working on getting things under control. Pt is still looking forward to going to [facility] residential on 4/17. He has also made an attempt to speak with his sister to see if he can stay with her until going to [facility] because it will be more of a healthy environment than where he is currently living but was unable to get a hold of her. Pt rates his depression at a 10 today and anxiety at an 8. Pt denies SI/HI. There are no further needs for pt at this time.

## 2012-01-10 NOTE — Progress Notes (Signed)
[facility] Adult Inpatient Family/Significant Other Suicide Prevention Education  Suicide Prevention Education Education Completed;  : Patient's sister, Cherly Beach, at [phone] has been identified by the patient as the family member identified as the person(s) who will aid the patient in the event of a mental health crisis (suicidal ideations/suicide attempt).  With written consent from the patient, the family member/significant other has been provided the following suicide prevention education, prior to the and/or following the discharge of the patient.  The suicide prevention education provided includes the following:  Suicide risk factors  Suicide prevention and interventions  National Suicide Hotline telephone number  [facility] assessment telephone number  [facility] Emergency Assistance [place] and/or Residential Mobile Crisis Unit telephone number  Request made of family/significant other to:  Remove weapons (e.g., guns, rifles, knives), all items previously/currently identified as safety concern.    Remove drugs/medications (over-the-counter, prescriptions, illicit drugs), all items previously/currently identified as a safety concern.  Sister states there are no guns or narcotics in patient's home and it would not work well to remove all the knives.  The family member/significant other verbalizes understanding of the suicide prevention education information provided.  The family member/significant other agrees to remove the items of safety concern listed above.  Lyla Glassing 01/10/2012, 4:29 PM

## 2012-01-10 NOTE — B[facility] Counselor (Signed)
Adult Comprehensive Assessment  Patient ID: Michael [name], male   DOB: November 07, 1950, 61 y.o.   MRN: [id number]  Information Source: Information source: Patient  Current Stressors:  Educational / Learning stressors: None Reported Employment / Job issues: None Reported Family Relationships: None Reported Museum/gallery curator / Lack of resources (include bankruptcy): None Reported Housing / Lack of housing: None Reported Physical health (include injuries & life threatening diseases): Patient reports he was recently diagnosed with HIV Positive. Social relationships: None Reported Substance abuse: Patient reports he had an injury in 1985 that he began taken pain medications for and then began abusing crack cocaine on and off since he was 61 years old. Bereavement / Loss: None Reported  Living/Environment/Situation:  Living Arrangements: Alone Living conditions (as described by patient or guardian): "Bad" ... Patient reports he lives in an environment where drug dealers live upstairs and downstairs from his apartment. How long has patient lived in current situation?: Since September 18, 2011 What is atmosphere in current home: Dangerous  Family History:  Marital status: Divorced Divorced, when?: 1993 What types of issues is patient dealing with in the relationship?: "Our relationship went down hill when I got introduced to drugs". Does patient have children?: Yes How many children?: 2  (1-Son (24yrs old) & 1-Daughter (71yrs old)) How is patient's relationship with their children?: "My son sticks by me, but me and my daughter are not as close as me and my son".  Childhood History:  By whom was/is the patient raised?: Both parents Description of patient's relationship with caregiver when they were a child: "Good relationship" Patient's description of current relationship with people who raised him/her: Both deceased. Does patient have siblings?: Yes Number of Siblings: 55  (4-Brothers &  2-Sisters) Description of patient's current relationship with siblings: "Still very close. I'm closer to my second oldest brother." Did patient suffer any verbal/emotional/physical/sexual abuse as a child?: No Did patient suffer from severe childhood neglect?: No Has patient ever been sexually abused/assaulted/raped as an adolescent or adult?: No Was the patient ever a victim of a crime or a disaster?: No Witnessed domestic violence?: No Has patient been effected by domestic violence as an adult?: No  Education:  Highest grade of school patient has completed: 12th Grade Currently a student?: No Learning disability?: No  Employment/Work Situation:   Employment situation: On disability Why is patient on disability: Patient reports because of an accident he endured in 1985. How long has patient been on disability: Since 2006 Patient's job has been impacted by current illness: No What is the longest time patient has a held a job?: 12 Years Where was the patient employed at that time?: [employer] in the state of [place] Has patient ever been in the [employer]?: No Has patient ever served in Recruitment consultant?: No  Financial Resources:   Museum/gallery curator resources: [employer];Medicaid Does patient have a representative payee or guardian?: No  Alcohol/Substance Abuse:   If attempted suicide, did drugs/alcohol play a role in this?: Yes Alcohol/Substance Abuse Treatment Hx: Past Tx, Inpatient If yes, describe treatment: Inpatient Treatment Has alcohol/substance abuse ever caused legal problems?: No  Social Support System:   Patient's Community Support System: Good Describe Community Support System: Brother (Second oldest brother) Type of faith/religion: Methodist How does patient's faith help to cope with current illness?: Pray  Leisure/Recreation:   Leisure and Hobbies: "I love to fish, go to the mall to exercise, and read".  Strengths/Needs:   What things does the patient do well?:  Strength, faith,  and a people person. In what areas does patient struggle / problems for patient: Patient reports he wants his health to get better.  Discharge Plan:   Does patient have access to transportation?: No Plan for no access to transportation at discharge: Patient unsure about his transportation. Will patient be returning to same living situation after discharge?: No Plan for living situation after discharge: Patient reports he prefers to go directly to residential treatment for follow-up upon discharge. Currently receiving community mental health services: Yes (From Whom) ([name]) If no, would patient like referral for services when discharged?: Yes (What county?) ([place] Does patient have financial barriers related to discharge medications?: No  Summary/Recommendations:   Summary and Recommendations (to be completed by the evaluator): Patient is a 61 year old male. Patient admitted with diagnosis of Depressive Disorder NOS and Cocaine Dependence. Patient reports he has abused crack cocaine for about 20+ years and is now seeking detox treatment. Patient would benefit from crisis stabilization, medication evaluation, psycho ed and group therapy, and case management for discharge planning. Assessment completed by Frederich Cha MSW Intern Lyla Glassing. 01/10/2012

## 2012-01-10 NOTE — Progress Notes (Signed)
Cosigned by Sheilah Pigeon, LCSWA 4/5/20137:07 PM

## 2012-01-10 NOTE — Progress Notes (Signed)
[facility] Group Notes:  (Counselor/Nursing/MHT/Case Management/Adjunct)  01/10/2012 2:58 PM  Type of Therapy:  1:15PM Group Therapy  Participation Level:  None  Participation Quality:  Appropriate and Attentive  Affect:  Appropriate  Cognitive:  Alert  Insight:  None  Engagement in Group:  None  Engagement in Therapy:  None  Modes of Intervention:  Clarification, Support and Education and Exploration  Summary of Progress/Problems: Patient was attentive to educational DVD on vulnerability. Patient seemed to identify well with group discussion around vulnerability to relapse.   Frederich Cha 01/10/2012, 2:58 PM

## 2012-01-11 DIAGNOSIS — F329 Major depressive disorder, single episode, unspecified: Secondary | ICD-10-CM | POA: Diagnosis present

## 2012-01-11 LAB — GLUCOSE, CAPILLARY: Glucose-Capillary: 105 mg/dL — ABNORMAL HIGH (ref 70–99)

## 2012-01-11 MED ORDER — INSULIN GLARGINE 100 UNIT/ML ~~LOC~~ SOLN
20.0000 [IU] | Freq: Every day | SUBCUTANEOUS | Status: DC
  Administered 2012-01-11 – 2012-01-14 (×4): 20 [IU] via SUBCUTANEOUS
  Filled 2012-01-11: qty 3

## 2012-01-11 MED ORDER — BUPROPION HCL 100 MG PO TABS
100.0000 mg | ORAL_TABLET | Freq: Every day | ORAL | Status: DC
  Administered 2012-01-11 – 2012-01-13 (×3): 100 mg via ORAL
  Filled 2012-01-11 (×6): qty 1

## 2012-01-11 MED ORDER — HYDROXYZINE HCL 50 MG PO TABS
50.0000 mg | ORAL_TABLET | Freq: Every evening | ORAL | Status: DC | PRN
  Administered 2012-01-11 – 2012-01-14 (×4): 50 mg via ORAL
  Filled 2012-01-11: qty 14
  Filled 2012-01-11 (×2): qty 1

## 2012-01-11 MED ORDER — INSULIN GLARGINE 100 UNIT/ML ~~LOC~~ SOLN: 30.0000 [IU] | Freq: Every day | SUBCUTANEOUS | Status: DC

## 2012-01-11 NOTE — Progress Notes (Addendum)
Pt is active on unit interacting with peers and attending groups with good participation.  Rates depression at 9 and hopelessness at 8,though bright mood and affect does not match this rating.  Denies SI.  Is insightful about SA issues. No prn medications requested, though rates pain at a 10 on pt. Inventory. Support offered and 15' checks cont for safety.

## 2012-01-11 NOTE — Progress Notes (Signed)
[facility] Group Notes:  (Counselor/Nursing/MHT/Case Management/Adjunct)  01/11/2012 4:28 PM  Type of Therapy:  Group Therapy  Participation Level:  Active  Participation Quality:  Appropriate and Attentive  Affect:  Appropriate  Cognitive:  Appropriate  Insight:  Good  Engagement in Group:  Good  Engagement in Therapy:  Good  Modes of Intervention:  Activity, Clarification, Socialization and Support  Summary of Progress/Problems: Pt. participated in group discussion of self sabotaging behaviors and how they can each enable themselves in a positive way in over to overcome their self sabotaging behaviors.  Pt. spoke about how he moved into a apartment building and did not realize that there were drug dealers living in his apartment and how he was drawn back into drug use by friends. Pt. discussed that how he would enable himself in a positive way in terms of his recovery by going to a 30 day program after leaving [facility] and about how he is trying to find another apartment building to live in.   Michael [name] 01/11/2012, 4:28 PM

## 2012-01-11 NOTE — Progress Notes (Signed)
Pt pleasant and bright on approach, positive for group.  Notified doctor of Pt's low blood sugar this morning of 53 and received order to check Pt's blood sugar at 2000 and call back regarding medication.  Doctor upon call back with blood sugar of 108 changed Pt's Lantus of 30 units at HS to 20 units at 2000.  To evaluate results by CBG in AM. Pt denies SI/HI/hallucinations.  Interacting appropriately on unit.  Support and encouragement offered, will continue to monitor.

## 2012-01-11 NOTE — Progress Notes (Signed)
[facility] Group Notes:  (Counselor/Nursing/MHT/Case Management/Adjunct)  01/11/2012 11:26 AM  Type of Therapy:  Discharge Planning / Suicide Prevention  Participation Level:  Did not attend   Summary of Progress/Problems:  Pt did not attend group.   Greer Pickerel C 01/11/2012, 11:26 AM

## 2012-01-11 NOTE — Progress Notes (Signed)
[facility] MD Progress Note  01/11/2012 2:29 PM  Diagnosis:  Cocaine abuse                      Suicidal ideation  ADL's:  Intact  Sleep: Good  Appetite:  Good  Suicidal Ideation:  Yes, says it is worse when he is alone. No attempts, plans to step in front of a car, or off a bridge, to cut wrists. Homicidal Ideation:  no  AEB (as evidenced by):  Mental Status Examination/Evaluation: Objective:  Appearance: Casual  Eye Contact::  Good  Speech:  Clear and Coherent  Volume:  Normal  Mood:  Depressed   10/10 does contract for safety.  Affect:  Congruent  Thought Process:  Coherent  Orientation:  Full  Thought Content:  WDL  Suicidal Thoughts:  Yes.  with intent/plan  Homicidal Thoughts:  No  Memory:  Immediate;   Poor  Judgement:  Poor  Insight:  Lacking  Psychomotor Activity:  Normal  Concentration:  Poor  Recall:  Fair  Akathisia:  No  Handed:  Right  AIMS (if indicated):     Assets:  Communication Skills Desire for Improvement Housing  Sleep:  Number of Hours: 3.5    Vital Signs:Blood pressure 114/73, pulse 85, temperature 98.3 F (36.8 C), temperature source Oral, resp. rate 16, height 5\' 4"  (1.626 m), weight 72.576 kg (160 lb). Current Medications: Current Facility-Administered Medications  Medication Dose Route Frequency Provider Last Rate Last Dose  . acetaminophen (TYLENOL) tablet 650 mg  650 mg Oral Q6H PRN Jimmye Norman, PA-C   650 mg at 01/09/12 2156  . allopurinol (ZYLOPRIM) tablet 200 mg  200 mg Oral Daily Nena Polio, PA-C   200 mg at 01/11/12 1209  . alum & mag hydroxide-simeth (MAALOX/MYLANTA) 200-200-20 MG/5ML suspension 30 mL  30 mL Oral Q4H PRN Jimmye Norman, PA-C      . aspirin EC tablet 81 mg  81 mg Oral Daily Jimmye Norman, PA-C   81 mg at 01/11/12 1210  . diltiazem (CARDIZEM CD) 24 hr capsule 240 mg  240 mg Oral Daily Nena Polio, PA-C   240 mg at 01/11/12 1210  . DULoxetine (CYMBALTA) DR capsule 30 mg  30 mg Oral Daily Nena Polio, PA-C   30 mg at 01/11/12  0912  . Emtricitab-Rilpivir-Tenofovir 200-25-300 MG TABS 1 tablet  1 tablet Oral Daily Nena Polio, PA-C   1 tablet at 01/11/12 0913  . gabapentin (NEURONTIN) capsule 300 mg  300 mg Oral TID Nena Polio, PA-C   300 mg at 01/11/12 1210  . glimepiride (AMARYL) tablet 2 mg  2 mg Oral TID Nena Polio, PA-C   2 mg at 01/11/12 1211  . ibuprofen (ADVIL,MOTRIN) tablet 400 mg  400 mg Oral Q6H PRN Jimmye Norman, PA-C      . indomethacin (INDOCIN) capsule 25 mg  25 mg Oral TID PRN Nena Polio, PA-C      . insulin glargine (LANTUS) injection 30 Units  30 Units Subcutaneous QHS Nena Polio, PA-C   30 Units at 01/10/12 2138  . magnesium hydroxide (MILK OF MAGNESIA) suspension 30 mL  30 mL Oral Daily PRN Jimmye Norman, PA-C      . metFORMIN (GLUCOPHAGE) tablet 1,000 mg  1,000 mg Oral BID WC Nena Polio, PA-C   1,000 mg at 01/11/12 0912  . metoprolol succinate (TOPROL-XL) 24 hr tablet 25 mg  25 mg Oral Daily Nena Polio, PA-C   25 mg at 01/11/12 0912  . simvastatin (  ZOCOR) tablet 40 mg  40 mg Oral q1800 Nena Polio, PA-C   40 mg at 01/10/12 1723   Lab Results:  Results for orders placed during the hospital encounter of 01/09/12 (from the past 48 hour(s))  GLUCOSE, CAPILLARY     Status: Abnormal   Collection Time   01/09/12  9:48 PM      Component Value Range Comment   Glucose-Capillary 133 (*) 70 - 99 (mg/dL)    Comment 1 Notify RN     GLUCOSE, CAPILLARY     Status: Normal   Collection Time   01/10/12  5:41 AM      Component Value Range Comment   Glucose-Capillary 73  70 - 99 (mg/dL)   GLUCOSE, CAPILLARY     Status: Abnormal   Collection Time   01/10/12  9:35 AM      Component Value Range Comment   Glucose-Capillary 138 (*) 70 - 99 (mg/dL)   GLUCOSE, CAPILLARY     Status: Normal   Collection Time   01/10/12 11:50 AM      Component Value Range Comment   Glucose-Capillary 95  70 - 99 (mg/dL)   GLUCOSE, CAPILLARY     Status: Abnormal   Collection Time   01/10/12  4:31 PM      Component Value Range  Comment   Glucose-Capillary 142 (*) 70 - 99 (mg/dL)   GLUCOSE, CAPILLARY     Status: Normal   Collection Time   01/10/12  9:08 PM      Component Value Range Comment   Glucose-Capillary 98  70 - 99 (mg/dL)    Comment 1 Notify RN     GLUCOSE, CAPILLARY     Status: Abnormal   Collection Time   01/11/12  6:27 AM      Component Value Range Comment   Glucose-Capillary 53 (*) 70 - 99 (mg/dL)    Comment 1 Notify RN     GLUCOSE, CAPILLARY     Status: Normal   Collection Time   01/11/12  6:53 AM      Component Value Range Comment   Glucose-Capillary 98  70 - 99 (mg/dL)   GLUCOSE, CAPILLARY     Status: Abnormal   Collection Time   01/11/12 12:06 PM      Component Value Range Comment   Glucose-Capillary 105 (*) 70 - 99 (mg/dL)     Physical Findings: AIMS CIWA:    COWS:     Treatment Plan Summary: Daily contact with patient to assess and evaluate symptoms and progress in treatment Medication management  Plan: Will increase anti depressant and add SSRI. Review Labs, order HgbA1c, TSH  Michael [name] 01/11/2012, 2:29 PM

## 2012-01-12 LAB — HEMOGLOBIN A1C
Hgb A1c MFr Bld: 8.2 % — ABNORMAL HIGH (ref <5.7)
Mean Plasma Glucose: 189 mg/dL — ABNORMAL HIGH (ref <117)

## 2012-01-12 LAB — GLUCOSE, CAPILLARY: Glucose-Capillary: 88 mg/dL (ref 70–99)

## 2012-01-12 MED ORDER — DIPHENHYDRAMINE HCL 25 MG PO CAPS: 50.0000 mg | ORAL_CAPSULE | Freq: Four times a day (QID) | ORAL | Status: DC | PRN

## 2012-01-12 NOTE — Progress Notes (Signed)
Patient ID: MAMADY ARTZER, male   DOB: 02-04-1951, 61 y.o.   MRN: [id number]  [facility] Group Notes:  (Counselor/Nursing/MHT/Case Management/Adjunct)  01/12/2012 1:15 PM  Type of Therapy:  Group Therapy, Dance/Movement Therapy   Participation Level:  None  Participation Quality:  Drowsy  Affect:  Depressed  Cognitive:  Appropriate  Insight:  None  Engagement in Group:  None  Engagement in Therapy:  None  Modes of Intervention:  Clarification, Problem-solving, Role-play, Socialization and Support  Summary of Progress/Problems: Therapist asked group to define the term addiction and what it means to them.  Group discussed how old belief systems enable relapse.  Additionally, group discussed positive mechanisms to cope with triggers. Pt made eye contact with peers and therapist but did not share personal experiences with the group.       Dominica Severin

## 2012-01-12 NOTE — Progress Notes (Signed)
Patient ID: Michael [name], male   DOB: 05-Mar-1951, 61 y.o.   MRN: [id number]  01/12/2012 11:34 AM  Diagnosis:  Axis I: Major Depression, Recurrent severe  ADL's:  Intact  Sleep: Fair  Appetite:  Good  Suicidal Ideation:  Better today. Still has thoughts no plan or intent. Homicidal Ideation:  denies  Subjective: Pt. States he is dizzy today since he got up, also has some nausea as well.  His mouth is dry. Says he is sleeping a lot but not well.  Mental Status Examination/Evaluation: Objective:  Appearance: Casual and Fairly Groomed  Eye Contact::  Good  Speech:  Clear and Coherent and Normal Rate  Volume:  Normal  Mood:  Depressed  8/10  Affect:  Appropriate  Thought Process:  Goal Directed, Intact, Linear and Logical  Orientation:  Full  Thought Content:  WDL  Suicidal Thoughts:  Yes, but has decreased since arrival by 30-40% no plans  Homicidal Thoughts:  No  Memory:  Immediate;   Fair  Judgement:  Intact  Insight:  Fair  Psychomotor Activity:  Normal  Concentration:  Good  Recall:  Good  Akathisia:  No  Handed:  Right  AIMS (if indicated):     Assets:  Communication Skills Desire for Improvement  Sleep:  Number of Hours: 6.25    Vital Signs  117/77 Bp   Temp 97.3 resp: 16 Pulse 70 Current Medications: see active orders: Current Facility-Administered Medications   Lab Results: Results for Michael [name], Michael [name] (MRN [id number]) as of 01/12/2012 11:41  Ref. Range 01/11/2012 19:39  Mean Plasma Glucose Latest Range: <117 mg/dL 189 (H)  Hemoglobin A1C Latest Range: <5.7 % 8.2 (H)   Ros: + Dry mouth, dizziness, nausea           -no CP, sob, vomiting or diarrhea,   Physical Findings:    AIMS:   CIWA:  CIWA-Ar Total: 0  COWS:     Treatment Plan Summary: Daily contact with patient to assess and evaluate symptoms and progress in treatment Medication management Current new symptoms are consistent with side effect profile of welbutrin which was started yesterday.   Benadryl was ordered. Can take with meal at noon. Pt agreed to continue for a few days to see if side effects resolved as he noted some improvement in his depression from yesterday. Plan:  1. Medications, VS, Chart and labs all reviewed. Patient evaluated, medications changed.  2. Discussed blood sugars and reviewed HgbA1c with patient and elected to continue at current plan.  3. Encouraged good hydration to help alleviate dry mouth.  4. Discharge is anticipated  Monday or Tuesday.

## 2012-01-12 NOTE — Progress Notes (Signed)
Quiet,isolative this shift.  Rates depression/hopelessness at 9. "Off and on" SI and contracts for safety.Rates physical discomfort at 10 on pt inventory but did not verbalize to staff.  States he "woke up feeling bad". Encouraged afternoon group participation.  Energy is low. Support given and 15' checks cont for safety.

## 2012-01-12 NOTE — Progress Notes (Signed)
Pt pleasant as usual on approach, denies complaints.  Pt positive for night AA group.  Interacting appropriately on unit.  Reports good day.  Denies SI/HI/Hallucinations.  Support and encouragement offered, will continue to monitor.

## 2012-01-12 NOTE — Progress Notes (Signed)
Pt's blood sugar was 83 this morning.  New medication dosage and time schedule worked well, Pt pleased.

## 2012-01-12 NOTE — Progress Notes (Signed)
Nursing staff reported patient had episode of hypoglycemia-53, this AM. Therefore will reduce lantus insulin to 20 units to prevent further hypoglycemic episodes and repeat blood sugar in the morning.  This patient will benefit from a medicine consult if we continue to have difficulty with controlling blood sugars.

## 2012-01-12 NOTE — Progress Notes (Signed)
Patient ID: Michael [name], male   DOB: [birth year], 61 y.o.   MRN: [id number] Pt. attended and participated in aftercare planning group. Pt. accepted information on suicide prevention, warning signs to look for with suicide and crisis line numbers to use. The pt. agreed to call crisis line numbers if having warning signs or having thoughts of suicide. Pt. listed their current anxiety level as Pt. listed their current anxiety level as a 9 and  depression 9 on a scale of 1 to 10 with 10 being the high.  Pt accepted an AA meeting schedule. Pt. Indicated that he would like information on treatment facilities in [facility].

## 2012-01-13 LAB — GLUCOSE, CAPILLARY: Glucose-Capillary: 100 mg/dL — ABNORMAL HIGH (ref 70–99)

## 2012-01-13 MED ORDER — DILTIAZEM HCL ER COATED BEADS 240 MG PO CP24
240.0000 mg | ORAL_CAPSULE | Freq: Every day | ORAL | Status: DC
  Administered 2012-01-13 – 2012-01-15 (×3): 240 mg via ORAL
  Filled 2012-01-13 (×5): qty 1

## 2012-01-13 MED ORDER — ASPIRIN EC 81 MG PO TBEC
81.0000 mg | DELAYED_RELEASE_TABLET | Freq: Every day | ORAL | Status: DC
  Administered 2012-01-13 – 2012-01-15 (×3): 81 mg via ORAL
  Filled 2012-01-13 (×5): qty 1

## 2012-01-13 MED ORDER — ALLOPURINOL 100 MG PO TABS
200.0000 mg | ORAL_TABLET | Freq: Every day | ORAL | Status: DC
  Administered 2012-01-13 – 2012-01-15 (×3): 200 mg via ORAL
  Filled 2012-01-13 (×4): qty 2

## 2012-01-13 MED ORDER — GLIMEPIRIDE 4 MG PO TABS
2.0000 mg | ORAL_TABLET | Freq: Three times a day (TID) | ORAL | Status: DC
  Administered 2012-01-13 – 2012-01-15 (×8): 2 mg via ORAL
  Filled 2012-01-13 (×10): qty 1

## 2012-01-13 MED ORDER — GABAPENTIN 300 MG PO CAPS
300.0000 mg | ORAL_CAPSULE | Freq: Three times a day (TID) | ORAL | Status: DC
  Administered 2012-01-13 – 2012-01-15 (×8): 300 mg via ORAL
  Filled 2012-01-13 (×11): qty 1

## 2012-01-13 NOTE — Progress Notes (Signed)
Pt attended spiritual therapy group facilitated by Chaplain   Group focused on the concept of courage and where members find this concept in their lives.   Group collectively brainstormed definitions and contexts for courage and shared with the group ways these connect to their narrative, ways they have developed courage, and discussed specific ways they have been able to recognize when they need to enact courage.    Members then completed a personal mandala on courage and shared with the group.   Keyondre minimally participated in group.  He was not talkative, but responded when addressed.  He was appropriately attentive and completed the mandala, sharing with other group members that it is helpful for him to be able to recognize and name the pain that he is feeling.    Creta Levin MDiv, Chaplain

## 2012-01-13 NOTE — Progress Notes (Signed)
Patient ID: Michael [name], male   DOB: [birth year], 61 y.o.   MRN: [id number] Patient was pleasant and cooperative during the assessment. States his plan is to be discharged to [facility], then to [facility]. States after rehab he plans to move to another bldg but has to be 61yrs to be adm. Pt admits to still being depressed, and passive SI. Support and encouragement was offered.

## 2012-01-13 NOTE — Progress Notes (Signed)
S/O: Patient seen and evaluated. Chart reviewed. Patient stated that his mood was "ok, but his nausea was still bothering him at times".  Reviewed meds, possibly related to initiation of wellbutrin. His affect was mood congruent and constricted. He denied any current thoughts of self injurious behavior, suicidal ideation or homicidal ideation. There were no auditory or visual hallucinations, paranoia, delusional thought processes, or mania noted.  Thought process was linear and goal directed.  No psychomotor agitation or retardation was noted. His speech was normal rate, tone and volume. Eye contact was good. Judgment and insight are fair.  Patient has been up and engaged on the unit.  No acute safety concerns reported from team.  Meds:    . allopurinol  200 mg Oral Daily  . aspirin EC  81 mg Oral Daily  . diltiazem  240 mg Oral Daily  . DULoxetine  30 mg Oral Daily  . Emtricitab-Rilpivir-Tenofovir  1 tablet Oral Daily  . gabapentin  300 mg Oral TID  . glimepiride  2 mg Oral TID  . insulin glargine  20 Units Subcutaneous Q2000  . metFORMIN  1,000 mg Oral BID WC  . metoprolol succinate  25 mg Oral Daily  . simvastatin  40 mg Oral q1800  . DISCONTD: allopurinol  200 mg Oral Daily  . DISCONTD: aspirin EC  81 mg Oral Daily  . DISCONTD: buPROPion  100 mg Oral Daily  . DISCONTD: diltiazem  240 mg Oral Daily  . DISCONTD: gabapentin  300 mg Oral TID  . DISCONTD: glimepiride  2 mg Oral TID    A/P: Major Depression, Recurrent, Per Hx; Cocaine Dependence; HIV  Medications, VS, chart and labs all reviewed. Patient evaluated, medications changed to discontinue Wellbutrin as noted above.  Will see if nausea resolves and maximize Duloxetine if necessary. Medication education completed.  Pros, cons, risks, potential side effects and benefits were discussed with pt.  Pt agreeable with the plan.  See orders.  Discussed with team.

## 2012-01-13 NOTE — Progress Notes (Signed)
Pt has been up and active in the milieu today.  He rated both his depression and hopelessness a 9 and his anxiety a 10 on his self-inventory.  He did admit to having passive S/I but no plan and does contract for safety on the unit.  He did say that his depression and anxiety comes and goes and since he has been attending groups today his anxiety has decreased to a 5 and his depression about a 6.  He appears flat and depressed but once engaged his does smile and brighten when engaged.  His order today was to d/c his wellbutrin.  Pt claims he has an appointment to [facility] on 17th of this month.  He thinks he will go home first then go over to [facility] for the intake.  He has not voiced any complaints thus far.

## 2012-01-13 NOTE — Discharge Planning (Signed)
Othal attended AM group, good participation.  States he's here for help because of drug use.  Has lots of supports, mainly family.  Says he will call them today to find out about staying with them prior to getting into [facility] rehab on 4/17.

## 2012-01-13 NOTE — Progress Notes (Signed)
[facility] Group Notes:  (Counselor/Nursing/MHT/Case Management/Adjunct)  01/13/2012 12:19 PM  Type of Therapy:  Group Therapy at 11:00  Participation Level:  Minimal  Participation Quality:  Attentive  Affect:  Flat  Cognitive:  Alert and Appropriate  Insight:  None shared  Engagement in Group:  Limited  Engagement in Therapy:  Limited  Modes of Intervention:  Clarification, Socialization and Support  Summary of Progress/Problems:  Michael [name] shared he is looking forward to fishing this spring. He was attentive yet choose not to share during group discussion on "over coming obstacles"   Lyla Glassing 01/13/2012, 12:19 PM  [facility] Group Notes:  (Counselor/Nursing/MHT/Case Management/Adjunct)  01/13/2012 3:25 PM  Type of Therapy:  Group Therapy at 1:15  Participation Level:  Minimal  Participation Quality:  Drowsy  Affect:  Flat  Cognitive:  Oriented  Insight:  None shared  Engagement in Group:  Limited  Engagement in Therapy:  Limited  Modes of Intervention:  Education, Socialization and Support  Summary of Progress/Problems:  Michael [name] attended group session with Presentation by Henderson; patient appeared very drowsy.    Lyla Glassing 01/13/2012, 3:29 PM

## 2012-01-14 LAB — GLUCOSE, CAPILLARY
Glucose-Capillary: 102 mg/dL — ABNORMAL HIGH (ref 70–99)
Glucose-Capillary: 156 mg/dL — ABNORMAL HIGH (ref 70–99)
Glucose-Capillary: 151 mg/dL — ABNORMAL HIGH (ref 70–99)

## 2012-01-14 NOTE — Discharge Planning (Signed)
Michael [name] attended AM group.  Flat presentation with lots of complaints about physical pain related to his back.  Asked him where he would go while waiting for bed at [facility] if not able to get into [facility].  He will not call family for help.  "I have never needed any help from anyone before.  I am not going to start now."  Says he will return home until screening at [facility] rehab.  [facility] is checking his insurance to see if it will cover.

## 2012-01-14 NOTE — Progress Notes (Signed)
[facility] Group Notes:  (Counselor/Nursing/MHT/Case Management/Adjunct)  01/14/2012 6:03 PM   Type of Therapy:  Processing Group at 11:00 am  Participation Level:  Minimal  Participation Quality:  Sharing, Attentive  Affect:  Depressed  Cognitive:  Oriented  Insight:  Limited  Engagement in Group:  Limited  Engagement in Therapy:  Limited  Modes of Intervention:  Exploration and support  Summary of Progress/Problems:  Hubert shared from list of feelings it he felt depressed sad and unsure today. Other was not opened further elaboration and remained quiet for remainder group yet he was very attentive to others   [facility] Group Notes:  (Counselor/Nursing/MHT/Case Management/Adjunct)  01/14/2012 6:03 PM   Type of Therapy:  Counseling Group at 1:15 pm  Participation Level:  Minimal  Participation Quality:  Appropriate   Affect:  Depressed  Cognitive:   oriented  Insight:   limited   Engagement in Group:  Minimal  Engagement in Therapy:  Minimal  Modes of Intervention:   exploration and support   Summary of Progress/Problems:  Cogan shared that calls to relative revealed relative will be out of town for the next 2 weeks, the period of time which covers his waiting period to enter treatment facility. Neftaly was attentive to advice from others in regard to his situation as he plans to isolate himself from community in an apartment until he can enter treatment others suggested that this might not be a good idea and he needed to seek further help. Tigran is sure others that he can do it.   Frutoso Chase, LCSWA 01/14/2012 6:03 PM

## 2012-01-14 NOTE — Progress Notes (Signed)
S/O: Patient seen and evaluated in treatment team. Chart reviewed. Patient stated that his mood was "ok and his nausea has improved". His affect was mood congruent and slightly brighter. He denied any current thoughts of self injurious behavior, suicidal ideation or homicidal ideation. SI when isolated improved.  There were no auditory or visual hallucinations, paranoia, delusional thought processes, or mania noted.  Thought process was linear and goal directed.  No psychomotor agitation or retardation was noted. His speech was normal rate, tone and volume. Eye contact was good. Judgment and insight are fair.  Patient has been up and engaged on the unit.  No acute safety concerns reported from team.  Meds:    . allopurinol  200 mg Oral Daily  . aspirin EC  81 mg Oral Daily  . diltiazem  240 mg Oral Daily  . DULoxetine  30 mg Oral Daily  . Emtricitab-Rilpivir-Tenofovir  1 tablet Oral Daily  . gabapentin  300 mg Oral TID  . glimepiride  2 mg Oral TID  . insulin glargine  20 Units Subcutaneous Q2000  . metFORMIN  1,000 mg Oral BID WC  . metoprolol succinate  25 mg Oral Daily  . simvastatin  40 mg Oral q1800  . DISCONTD: buPROPion  100 mg Oral Daily    A/P: Major Depression, Recurrent; Cocaine Dependence; HIV  Medications, VS, chart and labs all reviewed. Patient evaluated, medications changed to discontinue Wellbutrin as noted above yesterday.  Pt improved.  Will maximize Duloxetine if necessary. Medication education completed.  Pros, cons, risks, potential side effects and benefits were discussed with pt.  Pt agreeable with the plan.  See orders.  Discussed with team.

## 2012-01-14 NOTE — Progress Notes (Signed)
Pt still rated his depression and hopelessness a 9 and his anxiety an 8 on his self-inventory.  He did check have thoughts of suicide "of and on" but has no plans and does contract for safety on the unit.  He did c/o headache at 0758 and was given tylenol which did relieve his pain.  He has talked today about when he is alone he becomes more depressed and that is when he starts thinking of suicide.  He stated,"that is why I try and stay out of my room here so that won't be on my mind"  He has remained up for groups and has been interacting with select peers and staff today.  He is still hoping to go to [facility] and still wants to try to get into [facility].  No med changes today.

## 2012-01-14 NOTE — BHH Counselor (Signed)
Adult Comprehensive Assessment  Patient ID: Michael [name], male   DOB: Jul 19, 1951, 61 y.o.   MRN: [id number]  Information Source: Information source: Patient  Current Stressors:  Educational / Learning stressors: None Reported Employment / Job issues: None Reported Family Relationships: None Reported Museum/gallery curator / Lack of resources (include bankruptcy): None Reported Housing / Lack of housing: None Reported Physical health (include injuries & life threatening diseases): Patient reports he was recently diagnosed with HIV Positive. Social relationships: None Reported Substance abuse: Patient reports he had an injury in 1985 that he began taken pain medications for and then began abusing crack cocaine on and off since he was 61 years old. Bereavement / Loss: None Reported  Living/Environment/Situation:  Living Arrangements: Alone Living conditions (as described by patient or guardian): "Bad" ... Patient reports he lives in an environment where drug dealers live upstairs and downstairs from his apartment. How long has patient lived in current situation?: Since September 18, 2011 What is atmosphere in current home: Dangerous  Family History:  Marital status: Divorced Divorced, when?: 1993 What types of issues is patient dealing with in the relationship?: "Our relationship went down hill when I got introduced to drugs". Does patient have children?: Yes How many children?: 2  (1-Son (87yrs old) & 1-Daughter (43yrs old)) How is patient's relationship with their children?: "My son sticks by me, but me and my daughter are not as close as me and my son".  Childhood History:  By whom was/is the patient raised?: Both parents Description of patient's relationship with caregiver when they were a child: "Good relationship" Patient's description of current relationship with people who raised him/her: Both deceased. Does patient have siblings?: Yes Number of Siblings: 63  (4-Brothers &  2-Sisters) Description of patient's current relationship with siblings: "Still very close. I'm closer to my second oldest brother." Did patient suffer any verbal/emotional/physical/sexual abuse as a child?: No Did patient suffer from severe childhood neglect?: No Has patient ever been sexually abused/assaulted/raped as an adolescent or adult?: No Was the patient ever a victim of a crime or a disaster?: No Witnessed domestic violence?: No Has patient been effected by domestic violence as an adult?: No  Education:  Highest grade of school patient has completed: 12th Grade Currently a student?: No Learning disability?: No  Employment/Work Situation:   Employment situation: On disability Why is patient on disability: Patient reports because of an accident he endured in 1985. How long has patient been on disability: Since 2006 Patient's job has been impacted by current illness: No What is the longest time patient has a held a job?: 12 Years Where was the patient employed at that time?: [employer] in the state of [place] Has patient ever been in the [employer]?: No Has patient ever served in Recruitment consultant?: No  Financial Resources:   Museum/gallery curator resources: [employer];Medicaid Does patient have a representative payee or guardian?: No  Alcohol/Substance Abuse:   If attempted suicide, did drugs/alcohol play a role in this?: Yes Alcohol/Substance Abuse Treatment Hx: Past Tx, Inpatient If yes, describe treatment: Inpatient Treatment Has alcohol/substance abuse ever caused legal problems?: No  Social Support System:   Patient's Community Support System: Good Describe Community Support System: Brother (Second oldest brother) Type of faith/religion: Methodist How does patient's faith help to cope with current illness?: Pray  Leisure/Recreation:   Leisure and Hobbies: "I love to fish, go to the mall to exercise, and read".  Strengths/Needs:   What things does the patient do well?:  Strength, faith,  and a people person. In what areas does patient struggle / problems for patient: Patient reports he wants his health to get better.  Discharge Plan:   Does patient have access to transportation?: No Plan for no access to transportation at discharge: Patient unsure about his transportation. Will patient be returning to same living situation after discharge?: No Plan for living situation after discharge: Patient reports he prefers to go directly to residential treatment for follow-up upon discharge. Currently receiving community mental health services: Yes (From Whom) ([name]) If no, would patient like referral for services when discharged?: Yes (What county?) ([place] Does patient have financial barriers related to discharge medications?: No  Summary/Recommendations:   Summary and Recommendations (to be completed by the evaluator): Patient is a 61 year old male. Patient admitted with diagnosis of Depressive Disorder NOS and Cocaine Dependence. Patient reports he has abused crack cocaine for about 20+ years and is now seeking detox treatment. Patient would benefit from crisis stabilization, medication evaluation, psycho ed and group therapy, and case management for discharge planning.  Assessment completed by Frederich Cha, JMSW Intern Lyla Glassing. 01/14/2012

## 2012-01-14 NOTE — Treatment Plan (Signed)
Interdisciplinary Treatment Plan Update (Adult)  Date: 01/14/2012  Time Reviewed: 8:31 AM   Progress in Treatment: Attending groups: Yes Participating in groups: Yes Taking medication as prescribed: Yes Tolerating medication: Yes   Family/Significant other contact made: Yes, by both counselor and CM  Patient understands diagnosis:  Yes  As evidenced by asking for help with mood, substance abuse issues Discussing patient identified problems/goals with staff:  Yes See below Medical problems stabilized or resolved:  Yes Denies suicidal/homicidal ideation: Yes  In tx team, but marked as on and off on self inventory Issues/concerns per patient self-inventory:  Yes Depression, hopelessness and anxiety are all 9's   C/O feeling lightheaded, dizziness, headaches, blurred vision, pain Other:  New problem(s) identified: N/A  Reason for Continuation of Hospitalization: Depression Medication stabilization Suicidal ideation  Interventions implemented related to continuation of hospitalization: Continue adjusting medications, encourage group attendance and participation  Additional comments:  Estimated length of stay:1 day  Discharge Plan:Call [facility] for bed tomorrow, pt has assessment scheduled at [facility] rehab 4/17  New goal(s): N/A  Review of initial/current patient goals per problem list:   1.  Goal(s):Eliminate SI  Met:  No  Target date:4/10  As evidenced [id number] report  2.  Goal (s):Decrease depression  Met:  No  Target date:4/10  As evidenced [id number] inventory score of 5 or less  3.  Goal(s):Identify comprehensive sobriety plan  Met:  Yes  Target date:4/8  As evidenced [id number] of going to rehab, either directly from here or from home  4.  Goal(s):  Met:  Yes  Target date:  As evidenced by:  Attendees: Patient:  Michael [name] 01/14/2012 8:31 AM  Family:     Physician:  Cheryll Cockayne 01/14/2012 8:31 AM   Nursing: Para March   01/14/2012 8:31 AM   Case  Manager:  Ripley Fraise, LCSW 01/14/2012 8:31 AM   Counselor:  Frutoso Chase, [place] 01/14/2012 8:31 AM   Other:     Other:     Other:     Other:      Scribe for Treatment Team:   Trish Mage, 01/14/2012 8:31 AM

## 2012-01-14 NOTE — Progress Notes (Signed)
Inpatient Diabetes Program Recommendations  AACE/ADA: New Consensus Statement on Inpatient Glycemic Control (2009)  Target Ranges:  Prepandial:   less than 140 mg/dL      Peak postprandial:   less than 180 mg/dL (1-2 hours)      Critically ill patients:  140 - 180 mg/dL   Reason for Visit: Hyperglycemia  Results for Michael [name], Michael [name] (MRN [id number]) as of 01/14/2012 08:50  Ref. Range 01/11/2012 06:53 01/11/2012 12:06 01/11/2012 16:53 01/11/2012 19:52 01/12/2012 06:13 01/12/2012 12:11 01/12/2012 19:59 01/13/2012 06:17 01/13/2012 17:17 01/13/2012 21:37 01/14/2012 06:09  Glucose-Capillary Latest Range: 70-99 mg/dL 98 105 (H) 106 (H) 108 (H) 83 88 110 (H) 100 (H) 207 (H) 134 (H) 99  Results for Michael [name], Michael [name] (MRN [id number]) as of 01/14/2012 08:50  Ref. Range 01/11/2012 19:39  Hemoglobin A1C Latest Range: <5.7 % 8.2 (H)    Inpatient Diabetes Program Recommendations Insulin - Basal: If am CBG > 140 mg/dL, titrate up Lantus (Home dose -30 units QHS) Correction (SSI): Add Novolog sensitive tidwc  Note: Will follow.  Thank you.

## 2012-01-14 NOTE — Progress Notes (Signed)
Recreation Therapy Notes  01/14/2012         Time: 1415      Group Topic/Focus: The focus of this group is on discussing various styles of communication and communicating assertively using 'I' (feeling) statements.  Participation Level: Active  Participation Quality: Redirectable  Affect: Blunted  Cognitive: Oriented   Additional Comments: Patient resistant, but participated appropriately with encouragement.   Paelyn Smick 01/14/2012 3:55 PM

## 2012-01-15 LAB — GLUCOSE, CAPILLARY
Glucose-Capillary: 112 mg/dL — ABNORMAL HIGH (ref 70–99)
Glucose-Capillary: 78 mg/dL (ref 70–99)

## 2012-01-15 MED ORDER — DULOXETINE HCL 30 MG PO CPEP: 30.0000 mg | ORAL_CAPSULE | Freq: Every day | ORAL | Status: DC

## 2012-01-15 MED ORDER — INSULIN GLARGINE 100 UNIT/ML ~~LOC~~ SOLN: 20.0000 [IU] | Freq: Every day | SUBCUTANEOUS | Status: DC

## 2012-01-15 MED ORDER — ASPIRIN 81 MG PO CHEW
81.0000 mg | CHEWABLE_TABLET | Freq: Every day | ORAL | Status: DC
  Filled 2012-01-15: qty 14

## 2012-01-15 MED ORDER — GLIMEPIRIDE 2 MG PO TABS: 2.0000 mg | ORAL_TABLET | Freq: Three times a day (TID) | ORAL | Status: DC

## 2012-01-15 MED ORDER — DILTIAZEM HCL ER COATED BEADS 240 MG PO CP24: 240.0000 mg | ORAL_CAPSULE | Freq: Every day | ORAL | Status: DC

## 2012-01-15 MED ORDER — METOPROLOL SUCCINATE ER 25 MG PO TB24: 25.0000 mg | ORAL_TABLET | Freq: Every day | ORAL | Status: DC

## 2012-01-15 MED ORDER — PRAVASTATIN SODIUM 80 MG PO TABS: 80.0000 mg | ORAL_TABLET | Freq: Every day | ORAL | Status: DC

## 2012-01-15 MED ORDER — HYDROXYZINE HCL 50 MG PO TABS: 50.0000 mg | ORAL_TABLET | Freq: Every evening | ORAL | Status: AC | PRN

## 2012-01-15 MED ORDER — ALLOPURINOL 100 MG PO TABS: 200.0000 mg | ORAL_TABLET | ORAL | Status: DC

## 2012-01-15 MED ORDER — ASPIRIN 81 MG PO CHEW: 81.0000 mg | CHEWABLE_TABLET | Freq: Every day | ORAL | Status: DC

## 2012-01-15 MED ORDER — METFORMIN HCL 1000 MG PO TABS: 1000.0000 mg | ORAL_TABLET | Freq: Two times a day (BID) | ORAL | Status: DC

## 2012-01-15 MED ORDER — GABAPENTIN 300 MG PO CAPS: 300.0000 mg | ORAL_CAPSULE | Freq: Three times a day (TID) | ORAL | Status: DC

## 2012-01-15 MED ORDER — EMTRICITAB-RILPIVIR-TENOFOV DF 200-25-300 MG PO TABS: 1.0000 | ORAL_TABLET | Freq: Every day | ORAL | Status: DC

## 2012-01-15 NOTE — Progress Notes (Signed)
Pt d/c from hospital to [facility] with CM. All items returned. D/c instructions given, prescriptions given and samples given. Pt denies si at present and reports passive si when alone with safety plan in place. Pt denies hi.

## 2012-01-15 NOTE — Progress Notes (Signed)
[facility] Case Management Discharge Plan:  Will you be returning to the same living situation after discharge: No. At discharge, do you have transportation home?:Yes,  [facility] scheduled to p/u patient at d/c  Do you have the ability to pay for your medications:Yes,  patient has access to meds  Interagency Information:     Release of information consent forms completed and in the chart;  Patient's signature needed at discharge.  Patient to Follow up at:  Follow-up Information    Follow up with [facility] on 01/22/2012. (8:00am sharp)    Contact information:   [address] [place], [place] [address] [phone]       Follow up with [facility] on 01/15/2012.   Contact information:   [address] [place], [place] [address] [id number]            Patient denies SI/HI:   Yes,  per self inventory    Safety Planning and Suicide Prevention discussed:  Yes,  Discussed with patient  Barrier to discharge identified:No.  Summary and Recommendations:Pt will f/u with [facility] for treatment after d/c. Pt denies SI/HI.   Kamyia Thomason 01/15/2012, 11:15 AM

## 2012-01-15 NOTE — Discharge Planning (Signed)
Michael [name] was accepted at [facility].  His infectious disease CM will pick him up, take him home to get clothes and some meds, and take him to [facility].  She will be here at 1PM.

## 2012-01-15 NOTE — Progress Notes (Signed)
Patient ID: Michael [name], male   DOB: [birth year], 61 y.o.   MRN: [id number]  Pt smiled and informed the writer that he'd had a "pretty decent day". Pt attended groups on the unit, and interacted with his peers slightly more today than in previous days. Support and encouragement was offered.

## 2012-01-15 NOTE — Progress Notes (Signed)
[place] Group Notes:  (Counselor/Nursing/MHT/Case Management/Adjunct)  01/15/2012 2:11 PM  Type of Therapy:  Group Therapy  Participation Level:  None  Participation Quality:  Appropriate and Attentive  Affect:  Appropriate  Cognitive:  Appropriate  Insight:  None  Engagement in Group:  None  Engagement in Therapy:  None  Modes of Intervention:  Support and Exploration  Summary of Progress/Problems: Patient did not verbally participate in group discussion.   Frederich Cha 01/15/2012, 2:11 PM

## 2012-01-15 NOTE — [facility] Suicide Risk Assessment (Signed)
Suicide Risk Assessment  Discharge Assessment      Demographic factors: Male;Low socioeconomic status;Living alone;Unemployed  Current Mental Status Per Nursing Assessment::   On Admission:  Suicidal ideation indicated by patient;Suicide plan;Belief that plan would result in death At Discharge:  Pt denied any SI/HI/thoughts of self harm or acute psychiatric issues in treatment team with clinical, nursing and medical team present.  Current Mental Status Per Physician: Patient seen and evaluated in treatment team. Chart reviewed. Patient stated that his mood was "better and his nausea has improved". His affect was mood congruent and brighter. He denied any current thoughts of self injurious behavior, suicidal ideation or homicidal ideation. There were no auditory or visual hallucinations, paranoia, delusional thought processes, or mania noted. Thought process was linear and goal directed. No psychomotor agitation or retardation was noted. His speech was normal rate, tone and volume. Eye contact was good. Judgment and insight are fair. Patient has been up and engaged on the unit. No acute safety concerns reported from team.   Loss Factors: Financial problems / change in socioeconomic status;Loss of significant relationship (divorced for 20 yrs causes stress); on disability  Historical Factors: SI when isolating and alone.  Risk Reduction Factors:  Children and family support; Willingness to go to [place] and continue medications  Discharge Diagnoses:  AXIS I: Major Depression, Recurrent; Cocaine Dependence AXIS II:  Deferred AXIS III:   Past Medical History  Diagnosis Date  . Diabetes mellitus   . Hypertension   . Angina   . Shortness of breath   . Headache   . Gout   . Hyperlipidemia   . HIV infection   . Crack cocaine use    AXIS IV:  Moderate AXIS V:  50  Cognitive Features That Contribute To Risk: limited insight.   Suicide Risk: Pt viewed as a chronic increased risk of harm to  self in light of his past hx and risk factors.  Pt contracting for safety and in stable for discharge to [place] today.  Plan Of Care/Follow-up recommendations: Pt seen and evaluated in treatment team. Chart reviewed.  Pt stable for and requesting discharge to [facility].  Accepted.  Pt contracting for safety and does not currently meet [place] involuntary commitment criteria for continued hospitalization against their will.  Mental health treatment, medication management and continued sobriety will mitigate against the  increased risk of harm to self and/or others.  Discussed the importance of recovery further with pt, as well as, tools to move forward in a healthy & safe manner.  Pt agreeable with the plan.  Discussed with the team.  Please see orders, follow up directly with Suzzette Righter and full discharge summary to be completed by physician extender.  Recommend follow up with AA/NA.  Diet: Diabetic, heart healthy.  Activity: As tolerated.     Cheryll Cockayne 01/15/2012, 10:56 AM

## 2012-01-17 NOTE — Progress Notes (Signed)
Patient Discharge Instructions:  Psychiatric Admission Assessment Note Provided,  01/17/2012 After Visit Summary (AVS) Provided,  01/17/2012 Face Sheet Provided, 01/17/2012 Faxed/Sent to the Next Level Care provider:  01/17/2012 Provided Suicide Risk Assessment - Discharge Assessment 01/17/2012  Faxed to [facility] @ [phone] And to [facility] @ [phone]  Jola Baptist, 01/17/2012, 4:50 PM

## 2012-01-20 ENCOUNTER — Encounter: Payer: Self-pay | Admitting: *Deleted

## 2012-01-20 NOTE — Progress Notes (Signed)
BC transported pt to [facility] following a brief stay at [facility]. Pt will receive inpatient substance abuse treatment for 14 days through [facility] program. Pt will then be transferred to [place] for an additional 30 days of inpatient treatment.

## 2012-01-21 NOTE — Progress Notes (Signed)
Cosigned by Sheilah Pigeon, LCSWA 4/16/201312:31 PM

## 2012-01-28 NOTE — Discharge Summary (Signed)
Physician Discharge Summary Note  Patient:  Michael [name] is an 61 y.o., male MRN:  [id number] DOB:  Feb 13, 1951 Patient phone:  [phone] (home)  Patient address:   [place] [address],   Date of Admission:  01/09/2012 Date of Discharge: 01/15/2012  Reason for Admission: Relapse on cocaine  Discharge Diagnoses: Principal Problem:  *Depression, major Active Problems:  Cocaine abuse  Suicidal thoughts   Discharge Axis Diagnosis: Discharge Diagnoses:  AXIS I: Major Depression, Recurrent; Cocaine Dependence  AXIS II: Deferred  AXIS III:  Past Medical History   Diagnosis  Date   .  Diabetes mellitus    .  Hypertension    .  Angina    .  Shortness of breath    .  Headache    .  Gout    .  Hyperlipidemia    .  HIV infection    .  Crack cocaine use    AXIS IV: Moderate  AXIS V: 50  Level of Care:  OP  Hospital Course:  [place] was admitted for detox from cocaine, and crisis management.  He was treated with gabapentin and Cymbalta.  Medical problems were identified and treated.  Home medication was restarted as appropriate.     Improvement was monitored by patient's daily report of withdrawal symptom reduction. Emotional and mental status was monitored by daily self inventory reports completed by the patient and clinical staff.      The patient was evaluated by the treatment team for stability and plans for continued recovery upon discharge. He was offered further treatment options upon discharge including Residential, IOP, and Outpatient treatment.  The patient's motivation was an integral factor for scheduling further treatment.  Employment, transportation, bed availability, health status, family support, and any pending legal issues were also considered.  He elected to go to [facility] residential program and referrals were made.    Upon completion of detox the patient was both mentally and medically stable for discharge.    Consults:  None  Significant  Diagnostic Studies:  None  Discharge Vitals:   Blood pressure 128/71, pulse 81, temperature 97 F (36.1 C), temperature source Oral, resp. rate 16, height 5\' 4"  (1.626 m), weight 72.576 kg (160 lb).  Mental Status Exam: See Mental Status Examination and Suicide Risk Assessment completed by Attending Physician prior to discharge.  Discharge destination:  [facility] on 01/22/2012  Is patient on multiple antipsychotic therapies at discharge:  No   Has Patient had three or more failed trials of antipsychotic monotherapy by history:  No Recommended Plan for Multiple Antipsychotic Therapies: not applicable     Discharge Orders    Future Appointments: Provider: Department: Dept Phone: Center:   02/12/2012 1:15 PM Lou Cal, MD [facility] [phone] [facility]     Future Orders Please Complete By Expires   Diet - low sodium heart healthy      Increase activity slowly      Discharge instructions      Comments:   Please take all medications only as directed and keep all scheduled follow up appointments.     Medication List  As of 01/28/2012  5:18 PM   STOP taking these medications         aspirin 81 MG EC tablet      ibuprofen 400 MG tablet      OXYCODONE HCL ER PO      PARoxetine 20 MG tablet  TAKE these medications      Indication    allopurinol 100 MG tablet   Commonly known as: ZYLOPRIM   Take 2 tablets (200 mg total) by mouth every morning. For gout       aspirin 81 MG chewable tablet   Chew 1 tablet (81 mg total) by mouth daily. For blood thinner       diltiazem 240 MG 24 hr capsule   Commonly known as: CARDIZEM CD   Take 1 capsule (240 mg total) by mouth daily. For blood pressure       DULoxetine 30 MG capsule   Commonly known as: CYMBALTA   Take 1 capsule (30 mg total) by mouth daily. For depression/anxiety       Emtricitab-Rilpivir-Tenofovir 200-25-300 MG Tabs   Take 1 tablet by mouth daily.       gabapentin 300 MG capsule   Commonly  known as: NEURONTIN   Take 1 capsule (300 mg total) by mouth 3 (three) times daily. For anxiety       glimepiride 2 MG tablet   Commonly known as: AMARYL   Take 1 tablet (2 mg total) by mouth 3 (three) times daily. For sugar       indomethacin 25 MG capsule   Commonly known as: INDOCIN   Take 25 mg by mouth 3 (three) times daily as needed. For gout.       insulin glargine 100 UNIT/ML injection   Commonly known as: LANTUS   Inject 20 Units into the skin daily at 8 pm. For sugar       metFORMIN 1000 MG tablet   Commonly known as: GLUCOPHAGE   Take 1 tablet (1,000 mg total) by mouth 2 (two) times daily with a meal. For sugar       metoprolol succinate 25 MG 24 hr tablet   Commonly known as: TOPROL-XL   Take 1 tablet (25 mg total) by mouth daily. For blood pressure       pravastatin 80 MG tablet   Commonly known as: PRAVACHOL   Take 1 tablet (80 mg total) by mouth daily. For cholesterol.  Use simvastatin 40 mg daily for substitution provided on discharge            Follow-up Information    Follow up with [facility] on 01/22/2012. (8:00am sharp)    Contact information:   [address] [place], [place] [address] [phone]       Follow up with [facility] on 01/15/2012.   Contact information:   [address] [place], [place] [address] [id number]            Follow-up recommendations:  Activity as tolerated.  Heart healthy diet.  Comments:  None  Signed Milta Deiters T. Felicity Penix PAC For Dr. Cheryll Cockayne 01/28/2012, 5:18 PM

## 2012-02-12 ENCOUNTER — Ambulatory Visit (INDEPENDENT_AMBULATORY_CARE_PROVIDER_SITE_OTHER): Payer: Medicare Other | Admitting: Internal Medicine

## 2012-02-12 ENCOUNTER — Encounter: Payer: Self-pay | Admitting: *Deleted

## 2012-02-12 ENCOUNTER — Encounter: Payer: Self-pay | Admitting: Internal Medicine

## 2012-02-12 VITALS — BP 135/85 | HR 78 | Temp 97.0°F | Ht 67.5 in | Wt 183.7 lb

## 2012-02-12 DIAGNOSIS — E7800 Pure hypercholesterolemia, unspecified: Secondary | ICD-10-CM | POA: Insufficient documentation

## 2012-02-12 DIAGNOSIS — E1149 Type 2 diabetes mellitus with other diabetic neurological complication: Secondary | ICD-10-CM

## 2012-02-12 DIAGNOSIS — F32A Depression, unspecified: Secondary | ICD-10-CM

## 2012-02-12 DIAGNOSIS — F1410 Cocaine abuse, uncomplicated: Secondary | ICD-10-CM

## 2012-02-12 DIAGNOSIS — M797 Fibromyalgia: Secondary | ICD-10-CM | POA: Insufficient documentation

## 2012-02-12 DIAGNOSIS — IMO0001 Reserved for inherently not codable concepts without codable children: Secondary | ICD-10-CM

## 2012-02-12 DIAGNOSIS — F329 Major depressive disorder, single episode, unspecified: Secondary | ICD-10-CM

## 2012-02-12 DIAGNOSIS — Z23 Encounter for immunization: Secondary | ICD-10-CM

## 2012-02-12 DIAGNOSIS — E1142 Type 2 diabetes mellitus with diabetic polyneuropathy: Secondary | ICD-10-CM

## 2012-02-12 DIAGNOSIS — R079 Chest pain, unspecified: Secondary | ICD-10-CM

## 2012-02-12 DIAGNOSIS — E785 Hyperlipidemia, unspecified: Secondary | ICD-10-CM | POA: Insufficient documentation

## 2012-02-12 DIAGNOSIS — B20 Human immunodeficiency virus [HIV] disease: Secondary | ICD-10-CM

## 2012-02-12 LAB — GLUCOSE, CAPILLARY: Glucose-Capillary: 66 mg/dL — ABNORMAL LOW (ref 70–99)

## 2012-02-12 MED ORDER — INSULIN GLARGINE 100 UNIT/ML ~~LOC~~ SOLN: 25.0000 [IU] | Freq: Every day | SUBCUTANEOUS | Status: DC

## 2012-02-12 MED ORDER — GLUCOSE BLOOD VI STRP: ORAL_STRIP | Status: DC

## 2012-02-12 MED ORDER — DULOXETINE HCL 60 MG PO CPEP: 60.0000 mg | ORAL_CAPSULE | Freq: Every day | ORAL | Status: DC

## 2012-02-12 MED ORDER — FREESTYLE LANCETS MISC: Status: DC

## 2012-02-12 MED ORDER — FREESTYLE SYSTEM KIT: 1.0000 | PACK | Status: DC | PRN

## 2012-02-12 MED ORDER — PRAVASTATIN SODIUM 40 MG PO TABS: 40.0000 mg | ORAL_TABLET | Freq: Every evening | ORAL | Status: DC

## 2012-02-12 NOTE — Patient Instructions (Signed)
-  Keep up the great work at rehabilitation!!!  -Since you are already taking pravachol (pravastatin) for your cholesterol, please discontinue zocor (simvastatin).  Also, your cholesterol was so good, that let's try decreasing your dose to 40mg  every evening.  -Please stop taking glimeperide and increase Lantus to 25u before bed.  -You may continue hydroxyzine as you need for sleep  -Please increase Cymbalta to 60 mg daily  Please be sure to bring all of your medications with you to every visit.  Should you have any new or worsening symptoms, please be sure to call the clinic at [phone].

## 2012-02-12 NOTE — Assessment & Plan Note (Signed)
Patient's LDL equals 50 in March 2013. He is currently on Pravachol 80 mg before bed. In addition to this, he was receiving simvastatin [facility].  I have instructed that he asked them to discontinue simvastatin. He should also decrease Pravachol to 40 mg before bed. We will recheck lipid panel in one year.

## 2012-02-12 NOTE — Assessment & Plan Note (Addendum)
Lab Results  Component Value Date   HGBA1C 8.2* 01/11/2012   CREATININE 0.97 01/08/2012   CREATININE 1.03 12/10/2011   CHOL 118 12/10/2011   HDL 47 12/10/2011   TRIG 105 12/10/2011    Last eye exam and foot exam: Patient had eye exam recently at her eye care. We will request records. Foot exam to be done at next visit.  Assessment: Diabetes control: not controlled Progress toward goals: improved Barriers to meeting goals: lack of understanding of disease management  Plan: Diabetes treatment: Continue metformin 1000 mg twice daily. Somehow, glyburide was restarted at [facility]. This was a medication he was on prior to starting insulin and was discontinued by Dr. Joaquim Lai at [facility]. We will again discontinue glyburide at this time. We will increase Lantus to 25 units before bed. Refer to: none and Patient here to become established today, will need referral to dietitian at next visit, as he is anxious about what he can and cannot eat. Instruction/counseling given: reminded to bring blood glucose meter & log to each visit and reminded to bring medications to each visit  Prescription sent in for her glucometer, lancets, and glucose test strips  CBG was 66 during visit, and patient was given orange juice. During this episode, he did feel somewhat dizzy.  We will also request records from Dr. Joaquim Lai from [facility] in [facility]

## 2012-02-12 NOTE — Assessment & Plan Note (Signed)
I suspect that given the distribution of patient's pain, he is suffering with fibromyalgia. He notes improvement of pain since initiation of Cymbalta, though pain persists. He is only taking Cymbalta 30 mg daily, and the recommended dose for fibromyalgia and 60 mg. I suspect that dose was never escalated after the initial week of 30 mg daily.  -Escalate Cymbalta to 60 mg daily.

## 2012-02-12 NOTE — Assessment & Plan Note (Signed)
Patient notes persistent atypical left-sided chest pain. He is being followed by Dr. Ellyn Hack at [facility]. He underwent stress test for which he has followup in June. We will request records from [facility].

## 2012-02-12 NOTE — Assessment & Plan Note (Addendum)
Patient notes improved mood, he even scores it as a 9/10. He denies suicidal and homicidal ideation. At present he uses Vistaril to help with sleep, which was started at [facility].  Once patient is establishing his own home, I will readdress insomnia.  -Continue Cymbalta and Paxil

## 2012-02-12 NOTE — Assessment & Plan Note (Signed)
Patient is doing amazingly at [facility]. [facility] he was at [facility] for 2 weeks. Prior to this cocaine relapse in April, he had been clean for 4 years. This relapse seem to have been secondary to relocating to a bad neighborhood. He has an excellent bridge counselor from [place], who is helping to keep him motivated. I suspect that he will be able to stay clean after discharge from [facility]

## 2012-02-12 NOTE — Progress Notes (Signed)
Subjective:   Patient ID: Michael [name] male   DOB: [birth year] 61 y.o.   MRN: [id number]  HPI: [name] is a 61 y.o. man with past medical history significant for diabetes, hypertension, HIV, gallop, hypertension, and hyperlipidemia. He was recently admitted for cocaine abuse, and has since been living at residential rehabilitation. He has excellent support from his bridge counselor from the infectious disease clinic. He was referred here by Dr. Johnnye [name] for her management of his diabetes and hypertension.  Diabetes: Uncontrolled. He is diagnostically years ago. He was previously followed by Dr. Joaquim [name] at [facility] in [facility]. Initially he was on metformin and glimeperide, but then once insulin was started I believe, glimeperide discontinued.  Dr. Joaquim [name] had patient on Lantus 30 units before bed. For some reason, at [facility], Lantus was decreased to 18 units and glimeperide was added on again.  He notes one blood sugar of 46 in February. Otherwise he notes that the highest his blood sugars run her in 300s. Review of his CBG log [facility] revealed a.m. sugars no less than 100. His afternoon sugar is as low as 74. His evening sugars run from 128-305. When his sugars are low, he does feel dizziness and lightheadedness. He notes dry mouth, but denies polyphasia and polyuria. He is very anxious about what he can and cannot eat. He is compliant with metformin daily.  Chronic pain: He tells me that he has had intermittent, sharp, 6/10 pain behind his head, behind his neck, and bilateral shoulders for quite some time. In the distant past he has been on opiates. At present he occasionally uses ibuprofen in addition to Cymbalta with relief. Nothing exacerbates the pain.   Current Outpatient Prescriptions  Medication Sig Dispense Refill  . allopurinol (ZYLOPRIM) 100 MG tablet Take 2 tablets (200 mg total) by mouth every morning. For gout  60 tablet  0  . aspirin 81 MG chewable  tablet Chew 1 tablet (81 mg total) by mouth daily. For blood thinner  30 tablet  0  . diltiazem (CARDIZEM CD) 240 MG 24 hr capsule Take 1 capsule (240 mg total) by mouth daily. For blood pressure  30 capsule  0  . DULoxetine (CYMBALTA) 60 MG capsule Take 1 capsule (60 mg total) by mouth daily.  30 capsule  0  . Emtricitab-Rilpivir-Tenofovir 200-25-300 MG TABS Take 1 tablet by mouth daily.  30 tablet  0  . gabapentin (NEURONTIN) 300 MG capsule Take 1 capsule (300 mg total) by mouth 3 (three) times daily. For anxiety  90 capsule  6  . indomethacin (INDOCIN) 25 MG capsule Take 25 mg by mouth 3 (three) times daily as needed. For gout.      . insulin glargine (LANTUS) 100 UNIT/ML injection Inject 25 Units into the skin daily at 8 pm. For sugar  10 mL  0  . metFORMIN (GLUCOPHAGE) 1000 MG tablet Take 1 tablet (1,000 mg total) by mouth 2 (two) times daily with a meal. For sugar  60 tablet  0  . metoprolol succinate (TOPROL-XL) 25 MG 24 hr tablet Take 1 tablet (25 mg total) by mouth daily. For blood pressure  30 tablet  0  . PARoxetine (PAXIL) 20 MG tablet Take 20 mg by mouth every morning.      Marland Kitchen DISCONTD: DULoxetine (CYMBALTA) 30 MG capsule Take 1 capsule (30 mg total) by mouth daily. For depression/anxiety  30 capsule  0  . DISCONTD: insulin glargine (LANTUS) 100 UNIT/ML injection  Inject 20 Units into the skin daily at 8 pm. For sugar  10 mL  0  . DISCONTD: insulin glargine (LANTUS) 100 UNIT/ML injection Inject 25 Units into the skin daily at 8 pm. For sugar  10 mL  0  . glucose blood test strip Use as instructed  100 each  12  . glucose monitoring kit (FREESTYLE) monitoring kit 1 each by Does not apply route as needed for other.  1 each  0  . Lancets (FREESTYLE) lancets Use as instructed  100 each  12  . pravastatin (PRAVACHOL) 40 MG tablet Take 1 tablet (40 mg total) by mouth every evening.  30 tablet  11   Review of Systems: Constitutional: Denies fever, chills, diaphoresis, appetite change and  fatigue.  HEENT: Denies photophobia, eye pain, redness, hearing loss, ear pain, congestion, sore throat, rhinorrhea, sneezing, mouth sores, trouble swallowing, neck pain, neck stiffness and tinnitus.   Respiratory: Denies DOE, cough,  and wheezing.   Cardiovascular: Occasional left-sided chest pain that is tender to palpation, occasional palpitations with some shortness of breath, currently asymptomatic  Gastrointestinal: Denies nausea, vomiting, abdominal pain, diarrhea, constipation, blood in stool and abdominal distention.  Genitourinary: Denies dysuria, urgency, frequency, hematuria, flank pain and difficulty urinating.  Musculoskeletal: As per history of present illness Skin: Denies pallor, rash and wound.  Neurological: Denies seizures, syncope, weakness, light-headedness, numbness and headaches.  Psychiatric/Behavioral: Denies suicidal ideation, mood changes, confusion, nervousness,  and agitation; in the past he's had difficulty both falling asleep and staying asleep. He has been treated with V[facility]il [facility], and is now able to sleep through the night  Objective:  Physical Exam: Filed Vitals:   02/12/12 1323  BP: 135/85  Pulse: 78  Temp: 97 F (36.1 C)  TempSrc: Oral  Height: 5' 7.5" (1.715 m)   Constitutional: Vital signs reviewed.  Patient is a pleasant well-developed and well-nourished man in no acute distress and cooperative with exam.  accompanied by bridge counselor.  Mouth: no erythema or exudates, MMM Eyes: PERRL, EOMI, conjunctivae normal, No scleral icterus.  Neck: No thyromegaly Cardiovascular: RRR, S1 normal, S2 normal, no MRG, pulses symmetric and intact bilaterally Pulmonary/Chest: CTAB, no wheezes, rales, or rhonchi Abdominal: Soft. Non-tender, non-distended, bowel sounds are normal, no masses, organomegaly, or guarding present.  Musculoskeletal: No joint deformities, erythema, or stiffness, ROM full and no nontender Neurological: A&O x3 Skin: Warm, dry and  intact. No rash, cyanosis, or clubbing.  Psychiatric: Normal mood and affect. speech and behavior is normal. Judgment and thought content normal. Cognition and memory are normal.   Assessment & Plan:  Case and care discussed with Dr. Marinda Elk.  He should return in July for diabetes, hypertension and pain management followup. Please see problem-oriented charting for further details.

## 2012-02-12 NOTE — Assessment & Plan Note (Signed)
He is compliant with antiretrovirals. He is being followed by [name] at [facility]

## 2012-02-13 LAB — MICROALBUMIN / CREATININE URINE RATIO: Microalb Creat Ratio: 31.2 mg/g — ABNORMAL HIGH (ref 0.0–30.0)

## 2012-02-19 NOTE — Progress Notes (Signed)
BC transported patient to [facility] today for his scheduled new patient appointment. Patient saw Dr. Victorio Palm today and was "very impressed." Patient reports that he feels comfortable with her and feels like he can tell her anything. Following patients appointment, BC transported pt back to [facility] and reviewed his medication changes with the RN on staff, Izora Gala. BC also gave Izora Gala pt's pharmacy information and let her know that [facility] will deliver pt's medications to the facility for no charge. Pt will keep BC informed of his progress and his discharge date from [facility].

## 2012-02-25 ENCOUNTER — Other Ambulatory Visit: Payer: Self-pay | Admitting: Infectious Diseases

## 2012-02-25 DIAGNOSIS — B20 Human immunodeficiency virus [HIV] disease: Secondary | ICD-10-CM

## 2012-03-03 ENCOUNTER — Emergency Department (HOSPITAL_COMMUNITY)
Admission: EM | Admit: 2012-03-03 | Discharge: 2012-03-04 | Disposition: A | Discharge status: Home / Self Care | Payer: Medicare Other | Attending: Emergency Medicine | Admitting: Emergency Medicine

## 2012-03-03 ENCOUNTER — Emergency Department (HOSPITAL_COMMUNITY): Payer: Medicare Other

## 2012-03-03 ENCOUNTER — Encounter (HOSPITAL_COMMUNITY): Payer: Self-pay | Admitting: Emergency Medicine

## 2012-03-03 DIAGNOSIS — M542 Cervicalgia: Secondary | ICD-10-CM | POA: Insufficient documentation

## 2012-03-03 DIAGNOSIS — Z79899 Other long term (current) drug therapy: Secondary | ICD-10-CM | POA: Insufficient documentation

## 2012-03-03 DIAGNOSIS — R5381 Other malaise: Secondary | ICD-10-CM | POA: Insufficient documentation

## 2012-03-03 DIAGNOSIS — M79609 Pain in unspecified limb: Secondary | ICD-10-CM | POA: Insufficient documentation

## 2012-03-03 DIAGNOSIS — IMO0001 Reserved for inherently not codable concepts without codable children: Secondary | ICD-10-CM | POA: Insufficient documentation

## 2012-03-03 DIAGNOSIS — R109 Unspecified abdominal pain: Secondary | ICD-10-CM | POA: Insufficient documentation

## 2012-03-03 DIAGNOSIS — M545 Low back pain, unspecified: Secondary | ICD-10-CM | POA: Insufficient documentation

## 2012-03-03 DIAGNOSIS — W19XXXA Unspecified fall, initial encounter: Secondary | ICD-10-CM | POA: Insufficient documentation

## 2012-03-03 DIAGNOSIS — E119 Type 2 diabetes mellitus without complications: Secondary | ICD-10-CM | POA: Insufficient documentation

## 2012-03-03 DIAGNOSIS — R112 Nausea with vomiting, unspecified: Secondary | ICD-10-CM | POA: Insufficient documentation

## 2012-03-03 DIAGNOSIS — E785 Hyperlipidemia, unspecified: Secondary | ICD-10-CM | POA: Insufficient documentation

## 2012-03-03 DIAGNOSIS — Z21 Asymptomatic human immunodeficiency virus [HIV] infection status: Secondary | ICD-10-CM | POA: Insufficient documentation

## 2012-03-03 DIAGNOSIS — I10 Essential (primary) hypertension: Secondary | ICD-10-CM | POA: Insufficient documentation

## 2012-03-03 LAB — URINE MICROSCOPIC-ADD ON

## 2012-03-03 LAB — DIFFERENTIAL
Basophils Absolute: 0 10*3/uL (ref 0.0–0.1)
Basophils Relative: 0 % (ref 0–1)
Lymphocytes Relative: 38 % (ref 12–46)
Neutro Abs: 1.7 10*3/uL (ref 1.7–7.7)
Neutrophils Relative %: 48 % (ref 43–77)

## 2012-03-03 LAB — COMPREHENSIVE METABOLIC PANEL
ALT: 19 U/L (ref 0–53)
AST: 17 U/L (ref 0–37)
Albumin: 3.8 g/dL (ref 3.5–5.2)
Alkaline Phosphatase: 62 U/L (ref 39–117)
CO2: 24 mEq/L (ref 19–32)
Chloride: 102 mEq/L (ref 96–112)
GFR calc non Af Amer: 77 mL/min — ABNORMAL LOW (ref >90)
Potassium: 3.6 mEq/L (ref 3.5–5.1)
Total Bilirubin: 0.3 mg/dL (ref 0.3–1.2)

## 2012-03-03 LAB — URINALYSIS, ROUTINE W REFLEX MICROSCOPIC
Bilirubin Urine: NEGATIVE
Glucose, UA: >1000 mg/dL — AB
Hgb urine dipstick: NEGATIVE
Protein, ur: 100 mg/dL — AB

## 2012-03-03 LAB — CBC
HCT: 36.2 % — ABNORMAL LOW (ref 39.0–52.0)
Hemoglobin: 12.3 g/dL — ABNORMAL LOW (ref 13.0–17.0)
MCHC: 34 g/dL (ref 30.0–36.0)
RDW: 13.4 % (ref 11.5–15.5)
WBC: 3.6 10*3/uL — ABNORMAL LOW (ref 4.0–10.5)

## 2012-03-03 LAB — LIPASE, BLOOD: Lipase: 39 U/L (ref 11–59)

## 2012-03-03 MED ORDER — SODIUM CHLORIDE 0.9 % IV SOLN
1000.0000 mL | INTRAVENOUS | Status: DC
  Administered 2012-03-03: 1000 mL via INTRAVENOUS

## 2012-03-03 MED ORDER — ONDANSETRON HCL 4 MG/2ML IJ SOLN
INTRAMUSCULAR | Status: AC
  Filled 2012-03-03: qty 2

## 2012-03-03 MED ORDER — KETOROLAC TROMETHAMINE 30 MG/ML IJ SOLN
30.0000 mg | Freq: Once | INTRAMUSCULAR | Status: AC
  Administered 2012-03-03: 30 mg via INTRAVENOUS
  Filled 2012-03-03: qty 1

## 2012-03-03 MED ORDER — SODIUM CHLORIDE 0.9 % IV SOLN
1000.0000 mL | Freq: Once | INTRAVENOUS | Status: AC
  Administered 2012-03-03: 1000 mL via INTRAVENOUS

## 2012-03-03 MED ORDER — ONDANSETRON HCL 4 MG/2ML IJ SOLN
4.0000 mg | Freq: Once | INTRAMUSCULAR | Status: AC
  Administered 2012-03-03: 4 mg via INTRAVENOUS
  Filled 2012-03-03: qty 2

## 2012-03-03 MED ORDER — ONDANSETRON 8 MG PO TBDP: 8.0000 mg | ORAL_TABLET | Freq: Three times a day (TID) | ORAL | Status: AC | PRN

## 2012-03-03 MED ORDER — ETODOLAC 500 MG PO TABS: 500.0000 mg | ORAL_TABLET | Freq: Two times a day (BID) | ORAL | Status: DC

## 2012-03-03 NOTE — Discharge Instructions (Signed)
Nausea and Vomiting  Nausea is a sick feeling that often comes before throwing up (vomiting). Vomiting is a reflex where stomach contents come out of your mouth. Vomiting can cause severe loss of body fluids (dehydration). Children and elderly adults can become dehydrated quickly, especially if they also have diarrhea. Nausea and vomiting are symptoms of a condition or disease. It is important to find the cause of your symptoms.  CAUSES    Direct irritation of the stomach lining. This irritation can result from increased acid production (gastroesophageal reflux disease), infection, food poisoning, taking certain medicines (such as nonsteroidal anti-inflammatory drugs), alcohol use, or tobacco use.   Signals from the brain.These signals could be caused by a headache, heat exposure, an inner ear disturbance, increased pressure in the brain from injury, infection, a tumor, or a concussion, pain, emotional stimulus, or metabolic problems.   An obstruction in the gastrointestinal tract (bowel obstruction).   Illnesses such as diabetes, hepatitis, gallbladder problems, appendicitis, kidney problems, cancer, sepsis, atypical symptoms of a heart attack, or eating disorders.   Medical treatments such as chemotherapy and radiation.   Receiving medicine that makes you sleep (general anesthetic) during surgery.  DIAGNOSIS  Your caregiver may ask for tests to be done if the problems do not improve after a few days. Tests may also be done if symptoms are severe or if the reason for the nausea and vomiting is not clear. Tests may include:   Urine tests.   Blood tests.   Stool tests.   Cultures (to look for evidence of infection).   X-rays or other imaging studies.  Test results can help your caregiver make decisions about treatment or the need for additional tests.  TREATMENT  You need to stay well hydrated. Drink frequently but in small amounts.You may wish to drink water, sports drinks, clear broth, or eat frozen  ice pops or gelatin dessert to help stay hydrated.When you eat, eating slowly may help prevent nausea.There are also some antinausea medicines that may help prevent nausea.  HOME CARE INSTRUCTIONS    Take all medicine as directed by your caregiver.   If you do not have an appetite, do not force yourself to eat. However, you must continue to drink fluids.   If you have an appetite, eat a normal diet unless your caregiver tells you differently.   Eat a variety of complex carbohydrates (rice, wheat, potatoes, bread), lean meats, yogurt, fruits, and vegetables.   Avoid high-fat foods because they are more difficult to digest.   Drink enough water and fluids to keep your urine clear or pale yellow.   If you are dehydrated, ask your caregiver for specific rehydration instructions. Signs of dehydration may include:   Severe thirst.   Dry lips and mouth.   Dizziness.   Dark urine.   Decreasing urine frequency and amount.   Confusion.   Rapid breathing or pulse.  SEEK IMMEDIATE MEDICAL CARE IF:    You have blood or brown flecks (like coffee grounds) in your vomit.   You have black or bloody stools.   You have a severe headache or stiff neck.   You are confused.   You have severe abdominal pain.   You have chest pain or trouble breathing.   You do not urinate at least once every 8 hours.   You develop cold or clammy skin.   You continue to vomit for longer than 24 to 48 hours.   You have a fever.  MAKE SURE YOU:      Understand these instructions.   Will watch your condition.   Will get help right away if you are not doing well or get worse.  Document Released: 09/23/2005 Document Revised: 09/12/2011 Document Reviewed: 02/20/2011  [employer] Patient Information 2012 [employer].

## 2012-03-03 NOTE — ED Notes (Signed)
Will get labs when pt returns to room.

## 2012-03-03 NOTE — ED Notes (Signed)
CP:[id number] Expected date:03/03/12<BR> Expected time:<BR> Means of arrival:<BR> Comments:<BR> EMS 40 GC - vomiting

## 2012-03-03 NOTE — ED Provider Notes (Signed)
History     CSN: [id number]  Arrival date & time 03/03/12  1754   First MD Initiated Contact with Patient 03/03/12 1930      Chief Complaint  Patient presents with  . Emesis     HPI Comments: Pt recently finished a treatment center for crack cocaine.  Pt also fell recently and has had some trouble with his neck and back.  The pain is on both sides of his neck.  The pain is his back is in his lower back.  It hurts to move and walk.  Patient is a 61 y.o. male presenting with vomiting. The history is provided by the patient.  Emesis  This is a new problem. The current episode started more than 2 days ago (vomiting since friday). The problem has not changed since onset.There has been no fever. Associated symptoms include myalgias. Pertinent negatives include no abdominal pain, no chills, no diarrhea and no fever.  He has had several episodes of vomiting and has generalized adbominal discomfort.  Pt also complains of pain in his left lower leg following the fall.  Nothing seems to make it better.    Past Medical History  Diagnosis Date  . Diabetes mellitus   . Hypertension   . Angina   . Shortness of breath   . Headache   . Gout   . Hyperlipidemia   . HIV infection   . Crack cocaine use     Past Surgical History  Procedure Date  . Prostate surgery   . Bowel resection   . Back surgery     1988    Family History  Problem Relation Age of Onset  . Diabetes Mother   . Diabetes Father   . Diabetes Brother     History  Substance Use Topics  . Smoking status: Never Smoker   . Smokeless tobacco: Never Used  . Alcohol Use: No      Review of Systems  Constitutional: Negative for fever and chills.  Gastrointestinal: Positive for vomiting. Negative for abdominal pain and diarrhea.  Genitourinary: Negative for dysuria.  Musculoskeletal: Positive for myalgias.  Neurological: Negative for dizziness.  Psychiatric/Behavioral: Negative for confusion.    Allergies  Review  of patient's allergies indicates no known allergies.  Home Medications   Current Outpatient Rx  Name Route Sig Dispense Refill  . ALLOPURINOL 100 MG PO TABS Oral Take 2 tablets (200 mg total) by mouth every morning. For gout 60 tablet 0  . ASPIRIN 81 MG PO CHEW Oral Chew 1 tablet (81 mg total) by mouth daily. For blood thinner 30 tablet 0  . DILTIAZEM HCL ER COATED BEADS 240 MG PO CP24 Oral Take 1 capsule (240 mg total) by mouth daily. For blood pressure 30 capsule 0  . DULOXETINE HCL 60 MG PO CPEP Oral Take 1 capsule (60 mg total) by mouth daily. 30 capsule 0  . EMTRICITAB-RILPIVIR-TENOFOVIR 200-25-300 MG PO TABS Oral Take by mouth.    Marland Kitchen EMTRICITAB-RILPIVIR-TENOFOVIR 200-25-300 MG PO TABS Oral Take 1 tablet by mouth daily. 30 tablet 0  . GABAPENTIN 300 MG PO CAPS Oral Take 1 capsule (300 mg total) by mouth 3 (three) times daily. For anxiety 90 capsule 6  . GLUCOSE BLOOD VI STRP  Use as instructed 100 each 12  . FREESTYLE SYSTEM KIT Does not apply 1 each by Does not apply route as needed for other. 1 each 0  . HYDROXYZINE HCL 50 MG PO TABS Oral Take 50 mg by mouth at  bedtime.    . INDOMETHACIN 25 MG PO CAPS Oral Take 25 mg by mouth 3 (three) times daily as needed. For gout.    . INSULIN GLARGINE 100 UNIT/ML  SOLN Subcutaneous Inject 25 Units into the skin at bedtime. For sugar    . FREESTYLE LANCETS MISC  Use as instructed 100 each 12  . METFORMIN HCL 1000 MG PO TABS Oral Take 1 tablet (1,000 mg total) by mouth 2 (two) times daily with a meal. For sugar 60 tablet 0  . METOPROLOL SUCCINATE ER 25 MG PO TB24 Oral Take 1 tablet (25 mg total) by mouth daily. For blood pressure 30 tablet 0  . PAROXETINE HCL 20 MG PO TABS Oral Take 20 mg by mouth every morning.    Marland Kitchen PRAVASTATIN SODIUM 40 MG PO TABS Oral Take 1 tablet (40 mg total) by mouth every evening. 30 tablet 11    BP 134/83  Pulse 93  Temp(Src) 99 F (37.2 C) (Oral)  Resp 18  SpO2 97%  Physical Exam  Nursing note and vitals  reviewed. Constitutional: He appears well-developed and well-nourished. No distress.  HENT:  Head: Normocephalic and atraumatic.  Right Ear: External ear normal.  Left Ear: External ear normal.  Eyes: Conjunctivae are normal. Right eye exhibits no discharge. Left eye exhibits no discharge. No scleral icterus.  Neck: Neck supple. No tracheal deviation present.  Cardiovascular: Normal rate, regular rhythm and intact distal pulses.   Pulmonary/Chest: Effort normal and breath sounds normal. No stridor. No respiratory distress. He has no wheezes. He has no rales.  Abdominal: Soft. Bowel sounds are normal. He exhibits no distension, no abdominal bruit, no ascites and no mass. There is generalized tenderness. There is no rigidity, no rebound and no guarding. No hernia.  Musculoskeletal: He exhibits no edema and no tenderness.       Left knee: He exhibits normal range of motion, no swelling, no effusion and no ecchymosis. tenderness found.       Cervical back: He exhibits tenderness and bony tenderness. He exhibits no swelling, no edema and no deformity.       Lumbar back: He exhibits tenderness and bony tenderness. He exhibits no swelling, no edema and no deformity.       Left lower leg: He exhibits tenderness and bony tenderness. He exhibits no swelling, no edema and no deformity.  Neurological: He is alert. He has normal strength. No sensory deficit. Cranial nerve deficit:  no gross defecits noted. He exhibits normal muscle tone. He displays no seizure activity. Coordination normal.  Skin: Skin is warm and dry. No rash noted.  Psychiatric: He has a normal mood and affect.    ED Course  Procedures (including critical care time)  Labs Reviewed  CBC - Abnormal; Notable for the following:    WBC 3.6 (*)    Hemoglobin 12.3 (*)    HCT 36.2 (*)    All other components within normal limits  DIFFERENTIAL - Abnormal; Notable for the following:    Monocytes Relative 13 (*)    All other components  within normal limits  COMPREHENSIVE METABOLIC PANEL - Abnormal; Notable for the following:    Glucose, Bld 156 (*)    GFR calc non Af Amer 77 (*)    GFR calc Af Amer 90 (*)    All other components within normal limits  URINALYSIS, ROUTINE W REFLEX MICROSCOPIC - Abnormal; Notable for the following:    Specific Gravity, Urine 1.034 (*)    Glucose, UA >  1000 (*)    Ketones, ur TRACE (*)    Protein, ur 100 (*)    All other components within normal limits  URINE MICROSCOPIC-ADD ON - Abnormal; Notable for the following:    Squamous Epithelial / LPF FEW (*)    Bacteria, UA FEW (*)    All other components within normal limits  LIPASE, BLOOD  PROTIME-INR   Dg Cervical Spine Complete  03/03/2012  *RADIOLOGY REPORT*  Clinical Data: 61 year old male status post fall with pain.  CERVICAL SPINE - COMPLETE 4+ VIEW  Comparison: Head CT 08/26/2011.  Findings: Previous C4-C5 fusion.  Disc space narrowing elsewhere in the cervical spine sparing the C2-C3 level.  Prevertebral soft tissue contours are within normal limits.  Degenerative endplate spurring. Bilateral posterior element alignment is within normal limits.  Osseous foraminal stenosis on the right at the C4 level is moderate. Cervicothoracic junction alignment is within normal limits.  AP alignment and lung apices within normal limits.  C1-C2 alignment and odontoid within normal limits.  IMPRESSION: Previous C4-C5 cervical fusion with degenerative changes elsewhere. No acute fracture or listhesis identified in the cervical spine. Ligamentous injury is not excluded.  Original Report Authenticated By: Randall An, M.D.   Dg Lumbar Spine Complete  03/03/2012  *RADIOLOGY REPORT*  Clinical Data: 61 year old male status post fall with pain.  LUMBAR SPINE - COMPLETE 4+ VIEW  Comparison: [facility] 06/11/2009.  Findings: Postoperative changes to the L5-S1 level with posterior spinal hardware and cerclage wires.  Probable solid fusion at that level.  Trace  anterolisthesis of L4 on L5.  Vertebral height alignment within normal limits elsewhere.  Some H-shaped endplate deformities in the other lumbar levels are noted, but no similar thoracic findings are present.  L3-L4 and L4-L5 disc space narrowing.  No pars fracture.  Negative aside joint.  IMPRESSION: No acute osseous abnormality in the lumbar spine.  Trace anterolisthesis of L4 and L5 probably representing postoperative adjacent segment disease given evidence of fusion at L5-S1.  L3-L4 and L4-L5 disc degeneration.  Original Report Authenticated By: Randall An, M.D.   Dg Tibia/fibula Left  03/03/2012  *RADIOLOGY REPORT*  Clinical Data: 61 year old male status post fall with pain.  LEFT TIBIA AND FIBULA - 2 VIEW  Comparison: Left knee series from the same day.  Findings: Calcified atherosclerosis in the left lower extremity. Bone mineralization is within normal limits.  Grossly normal alignment at the left knee and ankle.  Small EKG button artifact. Left tibia and fibula appear intact.  Calcaneus appears intact.  IMPRESSION: No acute fracture or dislocation identified about the left tib-fib. Calcified peripheral vascular disease.  Original Report Authenticated By: Randall An, M.D.   Dg Knee Complete 4 Views Left  03/03/2012  *RADIOLOGY REPORT*  Clinical Data: 61 year old male status post fall with pain.  LEFT KNEE - COMPLETE 4+ VIEW  Comparison: None.  Findings: No joint effusion. Bone mineralization is within normal limits.  Joint spaces are preserved.  Patella intact.  No acute fracture.  IMPRESSION: No acute osseous abnormality identified about the left knee.  Original Report Authenticated By: Randall An, M.D.   Dg Abd Acute W/chest  03/03/2012  *RADIOLOGY REPORT*  Clinical Data: 61 year old male with vomiting abdominal pain. Cough.  ACUTE ABDOMEN SERIES (ABDOMEN 2 VIEW & CHEST 1 VIEW)  Comparison: 11/17/2011 and earlier.  Findings: Stable lung volumes.  Cardiac size and mediastinal contours  are within normal limits.  Visualized tracheal air column is within normal limits.  No pneumothorax or  pneumoperitoneum.  The lungs are clear.  Postoperative changes to the lumbosacral spine. Nonobstructed bowel gas pattern.  Pelvic phleboliths again noted.  Abdominal and pelvic visceral contours are stable.  Stable asymmetric left acetabular sclerosis which appears to be degenerative.  IMPRESSION: Nonobstructed bowel gas pattern, no free air.  Negative chest.  Original Report Authenticated By: Randall An, M.D.     No diagnosis found.    MDM  The patient has not had any further vomiting while in the emergency department. I doubt pancreatitis, hepatitis, cholelithiasis, appendicitis or bowel obstruction. The patient improved with IV fluids and anti-emetics. The patient mentioned a fall that he had previously. The x-rays do not show any evidence of fracture or dislocation. The patient be discharged home with a prescription for pain and nausea. I encouraged him to follow up with his Dr. this week to be rechecked. At this time there is no  evidence of acute emergency medical condition. I feel that he can safely be followed up as an outpatient        Kathalene Frames, MD 03/03/12 2339

## 2012-03-03 NOTE — ED Notes (Signed)
Pt. With vomiting since Friday, not able to keep fluids or food down.  Pt. Also having pain in his neck and left side due to a fall one week ago.  Pt. Is tearful and says he came home from a treatment center for crack cocaine abuse this past Wed.  Pt. Also reports some weakness recently.

## 2012-03-04 ENCOUNTER — Other Ambulatory Visit: Payer: Medicare Other

## 2012-03-04 DIAGNOSIS — B20 Human immunodeficiency virus [HIV] disease: Secondary | ICD-10-CM

## 2012-03-04 LAB — CBC WITH DIFFERENTIAL/PLATELET
Basophils Relative: 1 % (ref 0–1)
Eosinophils Absolute: 0 10*3/uL (ref 0.0–0.7)
Eosinophils Relative: 1 % (ref 0–5)
Lymphs Abs: 1.1 10*3/uL (ref 0.7–4.0)
MCH: 27.3 pg (ref 26.0–34.0)
MCHC: 33.4 g/dL (ref 30.0–36.0)
MCV: 81.6 fL (ref 78.0–100.0)
Platelets: 178 10*3/uL (ref 150–400)
RBC: 4.51 MIL/uL (ref 4.22–5.81)

## 2012-03-04 LAB — COMPREHENSIVE METABOLIC PANEL
CO2: 26 mEq/L (ref 19–32)
Creat: 1.41 mg/dL — ABNORMAL HIGH (ref 0.50–1.35)
Glucose, Bld: 240 mg/dL — ABNORMAL HIGH (ref 70–99)
Total Bilirubin: 0.4 mg/dL (ref 0.3–1.2)

## 2012-03-05 LAB — T-HELPER CELL (CD4) - (RCID CLINIC ONLY)
CD4 % Helper T Cell: 35 % (ref 33–55)
CD4 T Cell Abs: 350 uL — ABNORMAL LOW (ref 400–2700)

## 2012-03-06 ENCOUNTER — Other Ambulatory Visit: Payer: Self-pay | Admitting: Internal Medicine

## 2012-03-09 ENCOUNTER — Other Ambulatory Visit: Payer: Self-pay | Admitting: Internal Medicine

## 2012-03-09 ENCOUNTER — Other Ambulatory Visit: Payer: Self-pay | Admitting: *Deleted

## 2012-03-09 MED ORDER — METOPROLOL SUCCINATE ER 25 MG PO TB24: 25.0000 mg | ORAL_TABLET | Freq: Every day | ORAL | Status: DC

## 2012-03-09 NOTE — Progress Notes (Signed)
Message sent to front desk to schedule pt an appt; also rx called to [facility] pharmacy.

## 2012-03-09 NOTE — Telephone Encounter (Signed)
Please make appt Dr Victorio Palm End of July

## 2012-03-10 ENCOUNTER — Telehealth: Payer: Self-pay | Admitting: *Deleted

## 2012-03-10 DIAGNOSIS — B20 Human immunodeficiency virus [HIV] disease: Secondary | ICD-10-CM

## 2012-03-10 MED ORDER — EMTRICITAB-RILPIVIR-TENOFOV DF 200-25-300 MG PO TABS: 1.0000 | ORAL_TABLET | Freq: Every day | ORAL | Status: DC

## 2012-03-10 NOTE — Telephone Encounter (Signed)
I got the message from [place] with [facility] to call [facility]. I called & spoke with Ronalee Belts. He wanted to know what HIV med he was on. I called the pt & he states he gets Complera from [facility]. I called them & he last got the [place] on 03/06/12 & has 8 refills. Ronalee Belts had left it that he will call me back before 12:30. This pt has insurance. I do not know why [facility] would need to know this

## 2012-03-10 NOTE — Telephone Encounter (Signed)
Spoke with [employer] and they state that the patient is approved for ADAP and needed a new prescription for Complera sent to them. I sent it electronically.

## 2012-03-10 NOTE — Telephone Encounter (Signed)
Michael [name] had already sent the rx electronically

## 2012-03-10 NOTE — Telephone Encounter (Signed)
Michael [name], CMA spoke with [facility]. Pt has ADAP. rx sent for Complera

## 2012-03-12 ENCOUNTER — Other Ambulatory Visit: Payer: Self-pay | Admitting: *Deleted

## 2012-03-12 DIAGNOSIS — B20 Human immunodeficiency virus [HIV] disease: Secondary | ICD-10-CM

## 2012-03-12 MED ORDER — EMTRICITAB-RILPIVIR-TENOFOV DF 200-25-300 MG PO TABS: 1.0000 | ORAL_TABLET | Freq: Every day | ORAL | Status: DC

## 2012-03-17 ENCOUNTER — Encounter: Payer: Self-pay | Admitting: Infectious Diseases

## 2012-03-17 ENCOUNTER — Ambulatory Visit (INDEPENDENT_AMBULATORY_CARE_PROVIDER_SITE_OTHER): Payer: Medicare Other | Admitting: Infectious Diseases

## 2012-03-17 ENCOUNTER — Ambulatory Visit
Admission: RE | Admit: 2012-03-17 | Discharge: 2012-03-17 | Disposition: A | Discharge status: Home / Self Care | Payer: Medicare Other | Source: Ambulatory Visit | Attending: Infectious Diseases | Admitting: Infectious Diseases

## 2012-03-17 ENCOUNTER — Telehealth: Payer: Self-pay | Admitting: *Deleted

## 2012-03-17 VITALS — BP 138/85 | HR 91 | Temp 97.9°F | Ht 67.5 in | Wt 176.1 lb

## 2012-03-17 DIAGNOSIS — Z21 Asymptomatic human immunodeficiency virus [HIV] infection status: Secondary | ICD-10-CM

## 2012-03-17 DIAGNOSIS — E1149 Type 2 diabetes mellitus with other diabetic neurological complication: Secondary | ICD-10-CM

## 2012-03-17 DIAGNOSIS — M79609 Pain in unspecified limb: Secondary | ICD-10-CM

## 2012-03-17 DIAGNOSIS — B20 Human immunodeficiency virus [HIV] disease: Secondary | ICD-10-CM

## 2012-03-17 DIAGNOSIS — IMO0002 Reserved for concepts with insufficient information to code with codable children: Secondary | ICD-10-CM

## 2012-03-17 DIAGNOSIS — E1140 Type 2 diabetes mellitus with diabetic neuropathy, unspecified: Secondary | ICD-10-CM

## 2012-03-17 DIAGNOSIS — E1142 Type 2 diabetes mellitus with diabetic polyneuropathy: Secondary | ICD-10-CM

## 2012-03-17 DIAGNOSIS — F1410 Cocaine abuse, uncomplicated: Secondary | ICD-10-CM

## 2012-03-17 DIAGNOSIS — M79605 Pain in left leg: Secondary | ICD-10-CM

## 2012-03-17 NOTE — Assessment & Plan Note (Signed)
Appreciate IM f/u. Seems to be doing well.

## 2012-03-17 NOTE — Telephone Encounter (Signed)
I called IM again . Unable to reach them to make an appt

## 2012-03-17 NOTE — Assessment & Plan Note (Signed)
Recently relapsed, now clean. Will cont to support

## 2012-03-17 NOTE — Assessment & Plan Note (Signed)
Doing well, although still detectable virus, needs to be >1000 to do genotype. Will cont to watch. His CD4 is stable. vax are up to date. rtc 4-5 months. Forensic psychologist.

## 2012-03-17 NOTE — Progress Notes (Signed)
  Subjective:    Patient ID: Michael [name], male    DOB: 1951/05/16, 61 y.o.   MRN: [id number]  HPI 61 yo M with newly Dx HIV+ (CD4 350 VL 44,500 November 2012). Started on complera. Has hx of cocaine abuse.  Last FSG 187.   HIV 1 RNA Quant (copies/mL)  Date Value  03/04/2012 47*  12/10/2011 1503*  10/18/2011 62586*     CD4 T Cell Abs (cmm)  Date Value  03/04/2012 350*  12/10/2011 360*  10/18/2011 380*   Denies missed ART. Filling his own pill box.    Review of Systems  Constitutional:       States FSG run 90-130 Just out of rehab, clean for last 65 days. Going to [facility] with sponsor qday  Neurological:       Neuropathy LUE, 3rd digit. Has had episodes where his "legs give out".        Objective:   Physical Exam  Constitutional: He appears well-developed and well-nourished.  Eyes: EOM are normal. Pupils are equal, round, and reactive to light.  Neck: Neck supple.  Cardiovascular: Normal rate, regular rhythm and normal heart sounds.   Pulmonary/Chest: Effort normal and breath sounds normal.  Abdominal: Soft. Bowel sounds are normal. He exhibits no distension. There is no tenderness.  Musculoskeletal: He exhibits no edema.       Feet show no lesions,   Lymphadenopathy:    He has no cervical adenopathy.  Neurological: No sensory deficit.          Assessment & Plan:

## 2012-03-17 NOTE — Telephone Encounter (Signed)
I called IM to get him an appt. I had to leave a message. I told pt I will call him when I hear if they do not call him first

## 2012-03-18 ENCOUNTER — Encounter: Payer: Self-pay | Admitting: Internal Medicine

## 2012-03-18 ENCOUNTER — Ambulatory Visit (INDEPENDENT_AMBULATORY_CARE_PROVIDER_SITE_OTHER): Payer: Medicare Other | Admitting: Internal Medicine

## 2012-03-18 VITALS — BP 134/85 | HR 80 | Temp 98.6°F | Wt 176.1 lb

## 2012-03-18 DIAGNOSIS — M79605 Pain in left leg: Secondary | ICD-10-CM

## 2012-03-18 DIAGNOSIS — M25562 Pain in left knee: Secondary | ICD-10-CM

## 2012-03-18 DIAGNOSIS — M25569 Pain in unspecified knee: Secondary | ICD-10-CM

## 2012-03-18 MED ORDER — MELOXICAM 15 MG PO TABS: 15.0000 mg | ORAL_TABLET | Freq: Every day | ORAL | Status: DC

## 2012-03-18 NOTE — Assessment & Plan Note (Signed)
Based on patient's history, physical exam findings of pain with medial compression at knee, I believe that he would benefit from an evaluation by sports medicine. The differentials at this time include medial meniscal tear versus bruise versus arthritis. Given that patient is currently limping he would benefit from a knee brace. The patient was also prescribed Mobic 15 mg daily with food for pain. He was also given instructions for cold and warm compresses to his knee. Patient was advised to follow up with sports medicine. Followup as needed.

## 2012-03-18 NOTE — Progress Notes (Signed)
  Subjective:    Patient ID: Michael [name], male    DOB: October 19, 1950, 61 y.o.   MRN: [id number]  HPI  Patient is here today for leg pain .  He fell from a tree 3 years ago and always had some residual pain in his left leg. About 3 weeks ago he was trying to get onto a van when his left leg got caught up in between the door of the [place]. He has had pain in the leg ever since. The pain is present in the knee, radiates to the medial side of his leg towards his ankle. 9/10 in intensity, he is walking with a limp, pain is persistent 24 hours a day but is worse when he is resting. Nothing makes it better. He has not had any pain medications as yet.  No other complaints today.    Review of Systems  All other systems reviewed and are negative.       Objective:   Physical Exam  Constitutional: He is oriented to person, place, and time. He appears well-developed and well-nourished.  HENT:  Head: Normocephalic and atraumatic.  Eyes: Conjunctivae and EOM are normal. Pupils are equal, round, and reactive to light. No scleral icterus.  Neck: Normal range of motion. Neck supple. No JVD present. No thyromegaly present.  Cardiovascular: Normal rate, regular rhythm, normal heart sounds and intact distal pulses.  Exam reveals no gallop and no friction rub.   No murmur heard. Pulmonary/Chest: Effort normal and breath sounds normal. No respiratory distress. He has no wheezes. He has no rales.  Abdominal: Soft. Bowel sounds are normal. He exhibits no distension and no mass. There is no tenderness. There is no rebound and no guarding.  Musculoskeletal: Normal range of motion. He exhibits no edema and no tenderness.       No joint line tenderness, no peripatellar tenderness, anterior and posterior drawer test does not elicit tenderness. There is pain on the medial side of the knee joint with lateral bending on the knee joint.  Lymphadenopathy:    He has no cervical adenopathy.  Neurological: He is alert and  oriented to person, place, and time.  Psychiatric: He has a normal mood and affect. His behavior is normal.          Assessment & Plan:

## 2012-03-19 ENCOUNTER — Telehealth: Payer: Self-pay | Admitting: *Deleted

## 2012-03-19 ENCOUNTER — Ambulatory Visit (INDEPENDENT_AMBULATORY_CARE_PROVIDER_SITE_OTHER): Payer: Medicare Other | Admitting: Family Medicine

## 2012-03-19 ENCOUNTER — Encounter: Payer: Self-pay | Admitting: Family Medicine

## 2012-03-19 VITALS — BP 146/83 | HR 93 | Temp 98.0°F | Ht 68.0 in | Wt 176.0 lb

## 2012-03-19 DIAGNOSIS — M94262 Chondromalacia, left knee: Secondary | ICD-10-CM | POA: Insufficient documentation

## 2012-03-19 DIAGNOSIS — M25562 Pain in left knee: Secondary | ICD-10-CM

## 2012-03-19 DIAGNOSIS — M25569 Pain in unspecified knee: Secondary | ICD-10-CM

## 2012-03-19 NOTE — Patient Instructions (Addendum)
Your exam and normal knee x-rays (very very minimal arthritis) suggest you have a small medial meniscal tear in this knee as well as a shin contusion. Ice these areas 15 minutes at a time as needed. Continue your mobic with food daily. Do straight leg raise, knee extension exercises - 3 sets of 10 once a day - the stronger your quads are, the more pressure this tends to take off the knee. Can consider capsaicin topical medication up to four times a day for pain. Physical therapy is a consideration. If not improving as expected over the next month can consider MRI of your knee. Follow up with me in 1 month or as needed.

## 2012-03-19 NOTE — Telephone Encounter (Signed)
I told him to expect a call from IM after 04/06/12. He was fine with this

## 2012-03-19 NOTE — Telephone Encounter (Signed)
I called pt & to him know that it will be after 04/06/12 before IM will call with an appt for him.  I had to leave a message for him.

## 2012-03-19 NOTE — Progress Notes (Signed)
Subjective:    Patient ID: Michael [name], male    DOB: Jan 15, 1951, 61 y.o.   MRN: [id number]  PCP: Dr. Victorio Palm  HPI 60 yo M here for left knee and shin pain.  Patient reports he first experienced left knee pain 3 years ago - was topping trees when a limb broke causing him to fall but was caught by a large ladder though developed pain medial left knee. No swelling or bruising after this. Wore left knee brace and he improved though has had intermittent problems since then. Then 3 weeks ago was getting into a van when his knee gave out and caught shin on part of the [place]. Pain primarily proximal shin and anterior knee. Taking mobic. Has not been icing or using heat. No catching, locking.  Past Medical History  Diagnosis Date  . Diabetes mellitus   . Hypertension   . Angina   . Shortness of breath   . Headache   . Gout   . Hyperlipidemia   . HIV infection   . Crack cocaine use     Current Outpatient Prescriptions on File Prior to Visit  Medication Sig Dispense Refill  . allopurinol (ZYLOPRIM) 100 MG tablet Take 2 tablets (200 mg total) by mouth every morning. For gout  60 tablet  0  . aspirin 81 MG chewable tablet Chew 1 tablet (81 mg total) by mouth daily. For blood thinner  30 tablet  0  . CYMBALTA 60 MG capsule TAKE ONE CAPSULE   BY MOUTH   DAILY  30 capsule  0  . diltiazem (CARDIZEM CD) 240 MG 24 hr capsule Take 1 capsule (240 mg total) by mouth daily. For blood pressure  30 capsule  0  . Emtricitab-Rilpivir-Tenofovir 200-25-300 MG TABS Take 1 tablet by mouth daily.  30 tablet  6  . etodolac (LODINE) 500 MG tablet Take 1 tablet (500 mg total) by mouth 2 (two) times daily.  20 tablet  0  . gabapentin (NEURONTIN) 300 MG capsule Take 1 capsule (300 mg total) by mouth 3 (three) times daily. For anxiety  90 capsule  6  . glucose blood test strip Use as instructed  100 each  12  . glucose monitoring kit (FREESTYLE) monitoring kit 1 each by Does not apply route as needed for  other.  1 each  0  . hydrOXYzine (ATARAX/VISTARIL) 50 MG tablet Take 50 mg by mouth at bedtime.      . insulin glargine (LANTUS) 100 UNIT/ML injection Inject 25 Units into the skin at bedtime. For sugar      . insulin glargine (LANTUS) 100 UNIT/ML injection Inject 25 Units into the skin at bedtime.  10 mL  0  . Lancets (FREESTYLE) lancets Use as instructed  100 each  12  . meloxicam (MOBIC) 15 MG tablet Take 1 tablet (15 mg total) by mouth daily.  20 tablet  0  . metFORMIN (GLUCOPHAGE) 1000 MG tablet Take 1 tablet (1,000 mg total) by mouth 2 (two) times daily with a meal. For sugar  60 tablet  0  . metoprolol succinate (TOPROL-XL) 25 MG 24 hr tablet Take 1 tablet (25 mg total) by mouth daily. For blood pressure  30 tablet  11  . PARoxetine (PAXIL) 20 MG tablet Take 20 mg by mouth every morning.      . pravastatin (PRAVACHOL) 40 MG tablet Take 1 tablet (40 mg total) by mouth every evening.  30 tablet  11    Past Surgical History  Procedure Date  . Prostate surgery   . Bowel resection   . Back surgery     1988    No Known Allergies  History   Social History  . Marital Status: Single    Spouse Name: N/A    Number of Children: N/A  . Years of Education: N/A   Occupational History  . Not on file.   Social History Main Topics  . Smoking status: Never Smoker   . Smokeless tobacco: Never Used  . Alcohol Use: No  . Drug Use: No     clean 65 days  . Sexually Active: No     accepted condoms   Other Topics Concern  . Not on file   Social History Narrative  . No narrative on file    Family History  Problem Relation Age of Onset  . Diabetes Mother   . Diabetes Father   . Diabetes Brother     BP 146/83  Pulse 93  Temp 98 F (36.7 C) (Oral)  Ht 5\' 8"  (1.727 m)  Wt 176 lb (79.833 kg)  BMI 26.76 kg/m2  Review of Systems See HPI above.    Objective:   Physical Exam Gen: NAD  L knee: No gross deformity, ecchymoses, swelling.   TTP medial joint line, minimal TTP  proximal shin.  No other TTP. FROM. Negative ant/post drawers. Negative valgus/varus testing. Negative lachmanns. Negative mcmurrays, apleys, patellar apprehension, clarkes. NV intact distally.  R knee: FROM without pain or instability.    Assessment & Plan:  1. Left knee pain - Prior radiographs show minimal arthritis.  Exam and history suggest shin contusion and a small medial meniscal tear that likely was incurred 3 years ago and flared up with recent injury.  Intraarticular cortisone injection given today.  Continue mobic, icing.  Start home quad strengthening exercises.  F/u in 1 month for reevaluation.  After informed written consent, patient was seated on exam table. Left knee was prepped with alcohol swab and utilizing anteromedial approach, patient's left knee was injected intraarticularly with 3:1 marcaine: depomedrol. Patient tolerated the procedure well without immediate complications.

## 2012-03-21 NOTE — Assessment & Plan Note (Signed)
Prior radiographs show minimal arthritis.  Exam and history suggest shin contusion and a small medial meniscal tear that likely was incurred 3 years ago and flared up with recent injury.  Intraarticular cortisone injection given today.  Continue mobic, icing.  Start home quad strengthening exercises.  F/u in 1 month for reevaluation.  After informed written consent, patient was seated on exam table. Left knee was prepped with alcohol swab and utilizing anteromedial approach, patient's left knee was injected intraarticularly with 3:1 marcaine: depomedrol. Patient tolerated the procedure well without immediate complications.

## 2012-04-02 ENCOUNTER — Ambulatory Visit: Payer: Self-pay | Admitting: Family Medicine

## 2012-04-04 ENCOUNTER — Other Ambulatory Visit: Payer: Self-pay | Admitting: Internal Medicine

## 2012-04-16 ENCOUNTER — Ambulatory Visit (INDEPENDENT_AMBULATORY_CARE_PROVIDER_SITE_OTHER): Payer: Medicare Other | Admitting: Family Medicine

## 2012-04-16 ENCOUNTER — Encounter: Payer: Self-pay | Admitting: Family Medicine

## 2012-04-16 VITALS — BP 118/78 | HR 78 | Temp 98.2°F | Ht 68.0 in | Wt 175.0 lb

## 2012-04-16 DIAGNOSIS — M25572 Pain in left ankle and joints of left foot: Secondary | ICD-10-CM

## 2012-04-16 DIAGNOSIS — M25562 Pain in left knee: Secondary | ICD-10-CM

## 2012-04-16 DIAGNOSIS — M25579 Pain in unspecified ankle and joints of unspecified foot: Secondary | ICD-10-CM

## 2012-04-16 DIAGNOSIS — M25569 Pain in unspecified knee: Secondary | ICD-10-CM

## 2012-04-16 NOTE — Progress Notes (Addendum)
Subjective:    Patient ID: Michael [name], male    DOB: 12/03/1950, 61 y.o.   MRN: [id number]  PCP: Dr. Victorio Palm  Knee Pain    61 yo M here for f/u left knee and shin pain.  6/13: Patient reports he first experienced left knee pain 3 years ago - was topping trees when a limb broke causing him to fall but was caught by a large ladder though developed pain medial left knee. No swelling or bruising after this. Wore left knee brace and he improved though has had intermittent problems since then. Then 3 weeks ago was getting into a van when his knee gave out and caught shin on part of the [place]. Pain primarily proximal shin and anterior knee. Taking mobic. Has not been icing or using heat. No catching, locking.  7/11: Patient reports injection helped him for about a week - eased pain off but did not take it away. Having medial left knee pain radiates down calf into ankle. No swelling or bruising now - has had some mild swelling medial knee per his report. Left knee pops. Difficulty sleeping at night. Buckles on him at times. No catching, locking.  Past Medical History  Diagnosis Date  . Diabetes mellitus   . Hypertension   . Angina   . Shortness of breath   . Headache   . Gout   . Hyperlipidemia   . HIV infection   . Crack cocaine use     Current Outpatient Prescriptions on File Prior to Visit  Medication Sig Dispense Refill  . allopurinol (ZYLOPRIM) 100 MG tablet Take 2 tablets (200 mg total) by mouth every morning. For gout  60 tablet  0  . aspirin 81 MG chewable tablet Chew 1 tablet (81 mg total) by mouth daily. For blood thinner  30 tablet  0  . CYMBALTA 60 MG capsule TAKE ONE CAPSULE   BY MOUTH   DAILY  30 capsule  0  . diltiazem (CARDIZEM CD) 240 MG 24 hr capsule Take 1 capsule (240 mg total) by mouth daily. For blood pressure  30 capsule  0  . Emtricitab-Rilpivir-Tenofovir 200-25-300 MG TABS Take 1 tablet by mouth daily.  30 tablet  6  . etodolac (LODINE) 500 MG  tablet Take 1 tablet (500 mg total) by mouth 2 (two) times daily.  20 tablet  0  . gabapentin (NEURONTIN) 300 MG capsule Take 1 capsule (300 mg total) by mouth 3 (three) times daily. For anxiety  90 capsule  6  . glucose blood test strip Use as instructed  100 each  12  . glucose monitoring kit (FREESTYLE) monitoring kit 1 each by Does not apply route as needed for other.  1 each  0  . hydrOXYzine (ATARAX/VISTARIL) 50 MG tablet Take 50 mg by mouth at bedtime.      . insulin glargine (LANTUS) 100 UNIT/ML injection Inject 25 Units into the skin at bedtime. For sugar      . insulin glargine (LANTUS) 100 UNIT/ML injection Inject 25 Units into the skin at bedtime.  10 mL  0  . Lancets (FREESTYLE) lancets Use as instructed  100 each  12  . meloxicam (MOBIC) 15 MG tablet Take 1 tablet (15 mg total) by mouth daily.  20 tablet  0  . metFORMIN (GLUCOPHAGE) 1000 MG tablet Take 1 tablet (1,000 mg total) by mouth 2 (two) times daily with a meal. For sugar  60 tablet  0  . metoprolol succinate (TOPROL-XL) 25  MG 24 hr tablet Take 1 tablet (25 mg total) by mouth daily. For blood pressure  30 tablet  11  . PARoxetine (PAXIL) 20 MG tablet Take 20 mg by mouth every morning.      . pravastatin (PRAVACHOL) 40 MG tablet Take 1 tablet (40 mg total) by mouth every evening.  30 tablet  11    Past Surgical History  Procedure Date  . Prostate surgery   . Bowel resection   . Back surgery     1988    No Known Allergies  History   Social History  . Marital Status: Single    Spouse Name: N/A    Number of Children: N/A  . Years of Education: N/A   Occupational History  . Not on file.   Social History Main Topics  . Smoking status: Never Smoker   . Smokeless tobacco: Never Used  . Alcohol Use: No  . Drug Use: No     clean 65 days  . Sexually Active: No     accepted condoms   Other Topics Concern  . Not on file   Social History Narrative  . No narrative on file    Family History  Problem Relation  Age of Onset  . Diabetes Mother   . Diabetes Father   . Diabetes Brother     BP 118/78  Pulse 78  Temp 98.2 F (36.8 C) (Oral)  Ht 5\' 8"  (1.727 m)  Wt 175 lb (79.379 kg)  BMI 26.61 kg/m2  Review of Systems  See HPI above.    Objective:   Physical Exam  Gen: NAD  L knee: No gross deformity, ecchymoses, swelling.   TTP medial joint line, minimal TTP proximal shin.  No other TTP. FROM. Negative ant/post drawers. Negative valgus/varus testing. Negative lachmanns. Negative mcmurrays, apleys (mild medial pain), patellar apprehension, clarkes. NV intact distally.  L ankle: No gross deformity, swelling, ecchymoses FROM TTP medially over deltoid ligament.  No bony TTP. Negative ant drawer and talar tilt.   Negative syndesmotic compression. Thompsons test negative. NV intact distally.  R knee: FROM without pain or instability.    Assessment & Plan:  1. Left knee pain - Likely d/t small medial meniscal tear.  Minimal arthritis on prior radiographs.  Injection helped only mildly.  Advised we go ahead with formal physical therapy at this time.  Continue mobic, icing.  F/u in 1 month.  If not improving will go ahead with MRI and consider arthroscopy if tear confirmed.  2. Left ankle pain - believe this is mostly gait related from his knee pain.  Will add PT for this as well for general strengthening and balance.  Addendum 7/19:  Patient called stating pain is increasing in severity - doing home exercises, taking mobic and icing.  Will go ahead with MRI to further assess for medial meniscal tear.  May need arthroscopy if this is confirmed given not improving with conservative measures.  Addendum 7/24:  MRI results reviewed and discussed with patient - overall fairly normal MRI - no evidence of meniscal tear that would be aided by arthroscopy.  Some meniscal degeneration and chondromalacia - both conditions should be helped by PT (doing HEP because cannot afford copays to go  regularly) - encouraged to do HEP every day.  Tylenol, tramadol, voltaren gel for pain.  Can consider repeating injection but this typically does not help a great deal with his condition.  Pain management is an option if the above measures do  not help.

## 2012-04-17 ENCOUNTER — Encounter: Payer: Self-pay | Admitting: Family Medicine

## 2012-04-17 DIAGNOSIS — M25572 Pain in left ankle and joints of left foot: Secondary | ICD-10-CM | POA: Insufficient documentation

## 2012-04-17 NOTE — Assessment & Plan Note (Signed)
believe this is mostly gait related from his knee pain.  Will add PT for this as well for general strengthening and balance.

## 2012-04-17 NOTE — Assessment & Plan Note (Signed)
Likely d/t small medial meniscal tear.  Minimal arthritis on prior radiographs.  Injection helped only mildly.  Advised we go ahead with formal physical therapy at this time.  Continue mobic, icing.  F/u in 1 month.  If not improving will go ahead with MRI and consider arthroscopy if tear confirmed.

## 2012-04-24 NOTE — Addendum Note (Signed)
Addended by: Dene Gentry on: 04/24/2012 03:10 PM   Modules accepted: Orders

## 2012-04-27 ENCOUNTER — Ambulatory Visit: Payer: Medicare Other | Attending: Family Medicine | Admitting: Rehabilitative and Restorative Service Providers"

## 2012-04-27 DIAGNOSIS — IMO0001 Reserved for inherently not codable concepts without codable children: Secondary | ICD-10-CM | POA: Insufficient documentation

## 2012-04-27 DIAGNOSIS — M25569 Pain in unspecified knee: Secondary | ICD-10-CM | POA: Insufficient documentation

## 2012-04-28 ENCOUNTER — Ambulatory Visit (HOSPITAL_BASED_OUTPATIENT_CLINIC_OR_DEPARTMENT_OTHER)
Admission: RE | Admit: 2012-04-28 | Discharge: 2012-04-28 | Disposition: A | Discharge status: Home / Self Care | Payer: Medicare Other | Source: Ambulatory Visit | Attending: Family Medicine | Admitting: Family Medicine

## 2012-04-28 ENCOUNTER — Encounter: Payer: Self-pay | Admitting: Internal Medicine

## 2012-04-28 DIAGNOSIS — M25569 Pain in unspecified knee: Secondary | ICD-10-CM | POA: Insufficient documentation

## 2012-04-28 DIAGNOSIS — M25562 Pain in left knee: Secondary | ICD-10-CM

## 2012-04-28 DIAGNOSIS — M2240 Chondromalacia patellae, unspecified knee: Secondary | ICD-10-CM | POA: Insufficient documentation

## 2012-04-28 DIAGNOSIS — M7120 Synovial cyst of popliteal space [Baker], unspecified knee: Secondary | ICD-10-CM | POA: Insufficient documentation

## 2012-04-28 DIAGNOSIS — M109 Gout, unspecified: Secondary | ICD-10-CM

## 2012-04-28 DIAGNOSIS — Z0000 Encounter for general adult medical examination without abnormal findings: Secondary | ICD-10-CM | POA: Insufficient documentation

## 2012-04-28 HISTORY — DX: Gout, unspecified: M10.9

## 2012-04-29 ENCOUNTER — Encounter: Payer: Self-pay | Admitting: Internal Medicine

## 2012-04-29 ENCOUNTER — Ambulatory Visit (INDEPENDENT_AMBULATORY_CARE_PROVIDER_SITE_OTHER): Payer: Medicare Other | Admitting: Internal Medicine

## 2012-04-29 VITALS — BP 137/85 | HR 83 | Wt 161.3 lb

## 2012-04-29 DIAGNOSIS — E1149 Type 2 diabetes mellitus with other diabetic neurological complication: Secondary | ICD-10-CM

## 2012-04-29 DIAGNOSIS — F1410 Cocaine abuse, uncomplicated: Secondary | ICD-10-CM

## 2012-04-29 DIAGNOSIS — M25562 Pain in left knee: Secondary | ICD-10-CM

## 2012-04-29 DIAGNOSIS — E1165 Type 2 diabetes mellitus with hyperglycemia: Secondary | ICD-10-CM

## 2012-04-29 DIAGNOSIS — Z21 Asymptomatic human immunodeficiency virus [HIV] infection status: Secondary | ICD-10-CM

## 2012-04-29 DIAGNOSIS — IMO0002 Reserved for concepts with insufficient information to code with codable children: Secondary | ICD-10-CM

## 2012-04-29 DIAGNOSIS — I10 Essential (primary) hypertension: Secondary | ICD-10-CM

## 2012-04-29 DIAGNOSIS — E1142 Type 2 diabetes mellitus with diabetic polyneuropathy: Secondary | ICD-10-CM

## 2012-04-29 DIAGNOSIS — B20 Human immunodeficiency virus [HIV] disease: Secondary | ICD-10-CM

## 2012-04-29 DIAGNOSIS — M25569 Pain in unspecified knee: Secondary | ICD-10-CM

## 2012-04-29 DIAGNOSIS — E785 Hyperlipidemia, unspecified: Secondary | ICD-10-CM

## 2012-04-29 DIAGNOSIS — Z79899 Other long term (current) drug therapy: Secondary | ICD-10-CM

## 2012-04-29 LAB — BASIC METABOLIC PANEL
BUN: 6 mg/dL (ref 6–23)
Calcium: 9.8 mg/dL (ref 8.4–10.5)
Creat: 1.08 mg/dL (ref 0.50–1.35)
Glucose, Bld: 184 mg/dL — ABNORMAL HIGH (ref 70–99)

## 2012-04-29 LAB — POCT GLYCOSYLATED HEMOGLOBIN (HGB A1C): Hemoglobin A1C: 9

## 2012-04-29 LAB — GLUCOSE, CAPILLARY: Glucose-Capillary: 208 mg/dL — ABNORMAL HIGH (ref 70–99)

## 2012-04-29 MED ORDER — TRAMADOL HCL 50 MG PO TABS: 50.0000 mg | ORAL_TABLET | Freq: Four times a day (QID) | ORAL | Status: AC | PRN

## 2012-04-29 MED ORDER — DICLOFENAC SODIUM 1 % TD GEL: 1.0000 "application " | Freq: Four times a day (QID) | TRANSDERMAL | Status: DC

## 2012-04-29 NOTE — Assessment & Plan Note (Signed)
Lab Results  Component Value Date   NA 139 03/04/2012   K 4.5 03/04/2012   CL 103 03/04/2012   CO2 26 03/04/2012   BUN 21 03/04/2012   CREATININE 1.41* 03/04/2012   CREATININE 1.02 03/03/2012    BP Readings from Last 3 Encounters:  04/29/12 137/85  04/16/12 118/78  03/19/12 146/83    Assessment: Hypertension control:  controlled  Progress toward goals:  at goal Barriers to meeting goals:  no barriers identified  Plan: Hypertension treatment:  continue current medications - Diltiazem 24h 240 mg & toprol XL 25mg 

## 2012-04-29 NOTE — Assessment & Plan Note (Signed)
Doing well, but difficult as he is back home now with increasing temptations.  He recently turned 72 and [place].  Excellent support from bridge counselor.

## 2012-04-29 NOTE — Addendum Note (Signed)
Addended by: Dene Gentry on: 04/29/2012 04:17 PM   Modules accepted: Orders

## 2012-04-29 NOTE — Patient Instructions (Signed)
-  Please do intense glucose monitoring for 3 days and fill out the chart that I gave you. Then please return to see me in about 2 weeks so we can alter your diabetic regimen.  -Please make an appointment with Debera Lat, our diabetic dietitian, to help with your food habits  -Please be sure to follow up with Dr. Barbaraann Barthel regarding her left knee pain. In the meantime you can use ALternaGEL which was sent to your pharmacy, 4 times daily.  -I am concerned about your kidney function, so I'm checking some blood work today. I will call you if your labs are abnormal.  Please be sure to bring all of your medications with you to every visit.  Should you have any new or worsening symptoms, please be sure to call the clinic at [phone].  -Keep up the great work!!!

## 2012-04-29 NOTE — Assessment & Plan Note (Addendum)
Will follow with Dr. Barbaraann Barthel of sports medicine.  Written for voltaran gel today, but no narcotics.  MRI suggests condomalacia of medial femoral condyle.  Patient to follow up with Dr. Barbaraann Barthel for further recs.  Has been using mobic for pain.  Last renal function was impaired compared to baseline - will recheck today.  If elevated, may need to consider tramadol, but will discuss with Dr. Barbaraann Barthel.  Addendum - it looks like Tramadol has already been ordered by Dr. Barbaraann Barthel.

## 2012-04-29 NOTE — Progress Notes (Signed)
Subjective:   Patient ID: Michael [name] male   DOB: [birth year] 61 y.o.   MRN: [id number]  HPI: [name] is a 61 y.o. man with h/o diabetes, hypertension, a JP, and history of crack cocaine abuse. I last saw patient on 02/12/2012, during which visit he established himself as our patient for management of diabetes and hypertension. His primary concern today is the chronic pain he has been experiencing especially in his left knee, for which he has been followed by sports medicine and had an MRI yesterday.  # Chronic pain: He was seen in clinic by Dr. Cathren Laine on 03/18/2012. He has history suggestive of his left shin contusion and small medial meniscal tear approximately 3 years ago, which has recently flared after falling in a van in May 2013. He was referred to sports medicine to see Dr. Barbaraann Barthel.  On June 13 he was seen by Dr. Barbaraann Barthel for left knee pain, during which visit he was given a steroid injection. He has been using Mobic with minimal relief, and had tried 1 oxycodone 10 mg from his brother who recently underwent knee surgery, which gave him relief.Marland Kitchen He went to physical therapy for evaluation on Monday, and it was recommended that he participate in physical therapy twice a week, but the co-pay is $40, which he cannot afford. He had an MRI in 04/28/2012 of his left knee that revealed chondromalacia of the medial femoral condyle with slight peripheral degeneration of the meniscal capsular junction of the medial meniscus as well a small Baker cyst. He has not yet had followup with sports medicine since the MRI. He feels that pain from left knee radiates all the way up his left side including his left hip and left shoulder. He was escalated to Cymbalta 60 mg at last visit as his symptoms seem more related to fibromyalgia at that time. It is difficult to tell if this is effective.  # Hypertension:  He is compliant with his antihypertensive medications. He denies any symptoms of dizziness,  nosebleeds, vision changes, or headaches.  # Diabetes: Patient reports that he is compliant with metformin 1000 mg twice a day as well as Lantus 25 units before bed. He did not bring his glucometer with him today. He reports the lowest blood sugar he is seeing is 82, and he is asymptomatic with this blood sugar. The highest blood sugar he is seen has been 205, which was this morning. He denies having symptoms of hypoglycemia including dizziness, headaches, diaphoresis. He denies symptoms of hyperglycemia including polyuria, polydipsia, and polyphagia. He believes that he does not eat a lot of food, but the types of food he eats include begin, eggs, and biscuits per him, but his bridge counselor also notes that he eats a diet high in fruit. She does not exercise or walk frequently given his left knee pain.  # HIV: Stable, on 6 month recall with [name]. Compliant with HIV medications.    Current Outpatient Prescriptions  Medication Sig Dispense Refill  . allopurinol (ZYLOPRIM) 100 MG tablet Take 2 tablets (200 mg total) by mouth every morning. For gout  60 tablet  0  . aspirin 81 MG chewable tablet Chew 1 tablet (81 mg total) by mouth daily. For blood thinner  30 tablet  0  . CYMBALTA 60 MG capsule TAKE ONE CAPSULE   BY MOUTH   DAILY  30 capsule  0  . diltiazem (CARDIZEM CD) 240 MG 24 hr capsule Take 1 capsule (240 mg total)  by mouth daily. For blood pressure  30 capsule  0  . Emtricitab-Rilpivir-Tenofovir 200-25-300 MG TABS Take 1 tablet by mouth daily.  30 tablet  6  . etodolac (LODINE) 500 MG tablet Take 1 tablet (500 mg total) by mouth 2 (two) times daily.  20 tablet  0  . gabapentin (NEURONTIN) 300 MG capsule Take 1 capsule (300 mg total) by mouth 3 (three) times daily. For anxiety  90 capsule  6  . glucose blood test strip Use as instructed  100 each  12  . glucose monitoring kit (FREESTYLE) monitoring kit 1 each by Does not apply route as needed for other.  1 each  0  . hydrOXYzine  (ATARAX/VISTARIL) 50 MG tablet Take 50 mg by mouth at bedtime.      . insulin glargine (LANTUS) 100 UNIT/ML injection Inject 25 Units into the skin at bedtime. For sugar      . insulin glargine (LANTUS) 100 UNIT/ML injection Inject 25 Units into the skin at bedtime.  10 mL  0  . Lancets (FREESTYLE) lancets Use as instructed  100 each  12  . meloxicam (MOBIC) 15 MG tablet Take 1 tablet (15 mg total) by mouth daily.  20 tablet  0  . metFORMIN (GLUCOPHAGE) 1000 MG tablet Take 1 tablet (1,000 mg total) by mouth 2 (two) times daily with a meal. For sugar  60 tablet  0  . metoprolol succinate (TOPROL-XL) 25 MG 24 hr tablet Take 1 tablet (25 mg total) by mouth daily. For blood pressure  30 tablet  11  . PARoxetine (PAXIL) 20 MG tablet Take 20 mg by mouth every morning.      . pravastatin (PRAVACHOL) 40 MG tablet Take 1 tablet (40 mg total) by mouth every evening.  30 tablet  11   Review of Systems: Constitutional: Denies fever, chills, diaphoresis, appetite change and fatigue. +recent weight loss per our records (161.3 from 175 on 04/16/12) HEENT: Denies photophobia, eye pain, redness, hearing loss, ear pain, congestion, sore throat, rhinorrhea, sneezing, mouth sores, trouble swallowing, neck pain, neck stiffness and tinnitus.   Respiratory: Denies SOB, DOE, cough, chest tightness,  and wheezing.   Cardiovascular: Denies chest pain, palpitations and leg swelling.  Gastrointestinal: Denies nausea, vomiting, abdominal pain, diarrhea, constipation, blood in stool and abdominal distention.  Genitourinary: Denies dysuria, urgency, frequency, hematuria, flank pain and difficulty urinating.  Musculoskeletal: as per HPI Skin: Denies pallor, rash and wound.  Neurological: Denies dizziness, seizures, syncope, weakness, light-headedness, numbness and headaches.  Psychiatric/Behavioral: Denies suicidal ideation, mood changes, confusion, nervousness, sleep disturbance and agitation  Objective:  Physical  Exam: Filed Vitals:   04/29/12 1323  BP: 137/85  Pulse: 83  Weight: 161 lb 4.8 oz (73.165 kg)   Constitutional: Vital signs reviewed.  Patient is a well-developed and well-nourished man in no acute distress and cooperative with exam.  Mouth: no erythema or exudates, MMM Eyes: PERRL, EOMI, conjunctivae normal, No scleral icterus.  Cardiovascular: RRR, S1 normal, S2 normal, no MRG, pulses symmetric and intact bilaterally Pulmonary/Chest: CTAB, no wheezes, rales, or rhonchi Abdominal: Soft. Non-tender, non-distended, bowel sounds are normal, no masses, organomegaly, or guarding present.  Musculoskeletal: No peripheral edema, range of motion of left knee is stiff and limited due to pain, patient experiences pain upon straight leg test  Neurological: A&O x3, Strength is normal except for left lower extremity, which is decreased to 4+/5 - seems to be limited by pain, cranial nerve II-XII are grossly intact, no focal motor deficit, sensory  intact to light touch bilaterally.  Skin: Warm, dry and intact. No rash, cyanosis, or clubbing.  Psychiatric: Normal mood and affect. speech and behavior is normal. Judgment and thought content normal. Cognition and memory are normal.   Assessment & Plan:  Case and care discussed with Dr. Stann Mainland. Patient to return in 2 weeks to follow up with me regarding his diabetes and change in insulin regimen. Please he problem-oriented charting for further details.

## 2012-04-29 NOTE — Assessment & Plan Note (Signed)
Lab Results  Component Value Date   HGBA1C 9.0 04/29/2012   HGBA1C 8.2* 01/11/2012   CREATININE 1.41* 03/04/2012   CREATININE 1.02 03/03/2012   MICROALBUR 3.14* 02/12/2012   MICRALBCREAT 31.2* 02/12/2012   CHOL 118 12/10/2011   HDL 47 12/10/2011   TRIG 105 12/10/2011    Assessment: Diabetes control: not controlled Progress toward goals: deteriorated Barriers to meeting goals: lack of understanding of disease management  Plan: Diabetes treatment: continue current medications Patient does not have glucometer with him today - he does need increase of insulin but reports lowest CBG to be 82 and thus will ask him to perform intensive glucose monitoring for 3 days, meet with Dietician and then return to me in 2 weeks for regimen adjustment. Refer to: diabetes educator for medical nutrition therapy Instruction/counseling given: reminded to bring blood glucose meter & log to each visit, reminded to bring medications to each visit and discussed diet

## 2012-04-29 NOTE — Assessment & Plan Note (Signed)
Followed by [name].  Doing well.  Amazing Engineer, materials for support.  Compliant with [employer].  Patient to f/u with ID in about 4 months.

## 2012-05-06 ENCOUNTER — Ambulatory Visit (INDEPENDENT_AMBULATORY_CARE_PROVIDER_SITE_OTHER): Payer: Medicare Other | Admitting: Dietician

## 2012-05-06 VITALS — Ht 68.0 in | Wt 162.5 lb

## 2012-05-06 DIAGNOSIS — IMO0002 Reserved for concepts with insufficient information to code with codable children: Secondary | ICD-10-CM

## 2012-05-06 DIAGNOSIS — E1142 Type 2 diabetes mellitus with diabetic polyneuropathy: Secondary | ICD-10-CM

## 2012-05-06 DIAGNOSIS — E1140 Type 2 diabetes mellitus with diabetic neuropathy, unspecified: Secondary | ICD-10-CM

## 2012-05-06 DIAGNOSIS — E1149 Type 2 diabetes mellitus with other diabetic neurological complication: Secondary | ICD-10-CM

## 2012-05-06 NOTE — Progress Notes (Signed)
Medical Nutrition Therapy:  Appt start time: 0940 end time:  [id number].  Assessment:  Primary concerns today: Blood sugar control.  Usual eating pattern includes 3 meals and 3 snacks per day. Weight is within normal limits. A1C 9%. CBgs show excess gl;ucose excursion from small amounts of carbohydrate. Patient reports he used to give his father "coverage" insulin at meals. Usual physical activity is currenlty limited due to knee pain. Intake reported is moderate to high in carbohydrate as patient has decreased appetite and tends to eat fruits often and fairly large portions instead of other foods. Recently drinking more water. CBgs rising > 50 mg/dl with ~20 grams carb in mornings.  Progress Towards Goal(s):  In progress.   Nutritional Diagnosis:  NB-1.1 Food and nutrition-related knowledge deficit As related to lack of prior exposure to diabetes meal  onany and portions sizes of  carbohydrates.  As evidenced by his report and large fruit portions.    Intervention:  1- Nutrition education about carbohydrate portions, lower carb options for snacks. 2- Nutrition education about interpreting blood glucose data - before and after meal blood sugars. 3- Goal setting- education about A1C and blood sugar goals as well as options to improve his A1C to target. 4- coordination of care- as patient weight is wnl and has > 50 mg/dl rise in glucose post meal with minimal carbohydrate recommend he both decrease carbohydrate intake and begin small amount prandial insulin at breakfast.- likely meal with laragest  glucose excursion.   Monitoring/Evaluation:  Dietary intake, exercise, blood glucose testing beore and after dinner, and body weight in 4 week(s).

## 2012-05-06 NOTE — Patient Instructions (Addendum)
Please check your blood sugar before and 2 hours after lunch and  evening meal on August 24th, 25th and 26th- 3 days before our next meeting and bring this with you.   Fruits and vegetables are great- keep eating them!   Remember what one portion of fruit is 1/2 banana or a very small one, 1 cup melon, small pear, small apple, etc...    Limit juice to 1/2 cup each day and orange juice is the best juice.     Limit amount of starchy veggies like sweet potatoes, white potatoes, corn and dried beans, eat all the non- starchy veggies you want! ( see book for list)   Eggs, bean, low fat cheese and nuts are great protein substitutes and usually do not increase your blood sugar.  I enclosed another paper for you to record these blood sugars on.   If you have questions- please call- [phone]

## 2012-05-07 ENCOUNTER — Telehealth: Payer: Self-pay | Admitting: Dietician

## 2012-05-07 NOTE — Telephone Encounter (Signed)
Patient does not know name of eye doctor, but asks [place] to call his case worker Marlowe Kays at [phone] to find out their name.

## 2012-05-08 ENCOUNTER — Encounter: Payer: Self-pay | Admitting: Rehabilitation

## 2012-05-08 ENCOUNTER — Other Ambulatory Visit: Payer: Self-pay | Admitting: *Deleted

## 2012-05-08 MED ORDER — DULOXETINE HCL 60 MG PO CPEP: 60.0000 mg | ORAL_CAPSULE | Freq: Every day | ORAL | Status: DC

## 2012-05-11 ENCOUNTER — Emergency Department (HOSPITAL_COMMUNITY)
Admission: EM | Admit: 2012-05-11 | Discharge: 2012-05-13 | Disposition: A | Discharge status: Other Acute Inpatient Hospital | Payer: Medicare Other | Attending: Emergency Medicine | Admitting: Emergency Medicine

## 2012-05-11 ENCOUNTER — Encounter (HOSPITAL_COMMUNITY): Payer: Self-pay | Admitting: *Deleted

## 2012-05-11 DIAGNOSIS — R739 Hyperglycemia, unspecified: Secondary | ICD-10-CM

## 2012-05-11 DIAGNOSIS — F329 Major depressive disorder, single episode, unspecified: Secondary | ICD-10-CM

## 2012-05-11 DIAGNOSIS — M25562 Pain in left knee: Secondary | ICD-10-CM

## 2012-05-11 DIAGNOSIS — Z046 Encounter for general psychiatric examination, requested by authority: Secondary | ICD-10-CM | POA: Insufficient documentation

## 2012-05-11 DIAGNOSIS — Z21 Asymptomatic human immunodeficiency virus [HIV] infection status: Secondary | ICD-10-CM | POA: Insufficient documentation

## 2012-05-11 DIAGNOSIS — M109 Gout, unspecified: Secondary | ICD-10-CM | POA: Insufficient documentation

## 2012-05-11 DIAGNOSIS — F1920 Other psychoactive substance dependence, uncomplicated: Secondary | ICD-10-CM

## 2012-05-11 DIAGNOSIS — I10 Essential (primary) hypertension: Secondary | ICD-10-CM | POA: Insufficient documentation

## 2012-05-11 DIAGNOSIS — G629 Polyneuropathy, unspecified: Secondary | ICD-10-CM

## 2012-05-11 DIAGNOSIS — F32A Depression, unspecified: Secondary | ICD-10-CM

## 2012-05-11 DIAGNOSIS — E119 Type 2 diabetes mellitus without complications: Secondary | ICD-10-CM | POA: Insufficient documentation

## 2012-05-11 DIAGNOSIS — F1410 Cocaine abuse, uncomplicated: Secondary | ICD-10-CM | POA: Insufficient documentation

## 2012-05-11 DIAGNOSIS — E785 Hyperlipidemia, unspecified: Secondary | ICD-10-CM | POA: Insufficient documentation

## 2012-05-11 DIAGNOSIS — B20 Human immunodeficiency virus [HIV] disease: Secondary | ICD-10-CM

## 2012-05-11 DIAGNOSIS — R443 Hallucinations, unspecified: Secondary | ICD-10-CM | POA: Insufficient documentation

## 2012-05-11 DIAGNOSIS — R45851 Suicidal ideations: Secondary | ICD-10-CM

## 2012-05-11 LAB — CBC
MCV: 79.6 fL (ref 78.0–100.0)
Platelets: 157 10*3/uL (ref 150–400)
RBC: 4.6 MIL/uL (ref 4.22–5.81)
RDW: 12.8 % (ref 11.5–15.5)
WBC: 3 10*3/uL — ABNORMAL LOW (ref 4.0–10.5)

## 2012-05-11 LAB — RAPID URINE DRUG SCREEN, HOSP PERFORMED: Barbiturates: NOT DETECTED

## 2012-05-11 LAB — COMPREHENSIVE METABOLIC PANEL
ALT: 14 U/L (ref 0–53)
Alkaline Phosphatase: 69 U/L (ref 39–117)
CO2: 23 mEq/L (ref 19–32)
Calcium: 9.5 mg/dL (ref 8.4–10.5)
Chloride: 103 mEq/L (ref 96–112)
GFR calc Af Amer: 81 mL/min — ABNORMAL LOW (ref >90)
GFR calc non Af Amer: 70 mL/min — ABNORMAL LOW (ref >90)
Glucose, Bld: 262 mg/dL — ABNORMAL HIGH (ref 70–99)
Potassium: 3.3 mEq/L — ABNORMAL LOW (ref 3.5–5.1)
Sodium: 137 mEq/L (ref 135–145)
Total Bilirubin: 0.6 mg/dL (ref 0.3–1.2)

## 2012-05-11 MED ORDER — RILPIVIRINE HCL 25 MG PO TABS
25.0000 mg | ORAL_TABLET | Freq: Every day | ORAL | Status: DC
  Administered 2012-05-11 – 2012-05-12 (×2): 25 mg via ORAL
  Filled 2012-05-11 (×3): qty 1

## 2012-05-11 MED ORDER — DILTIAZEM HCL ER COATED BEADS 240 MG PO CP24
240.0000 mg | ORAL_CAPSULE | Freq: Every day | ORAL | Status: DC
  Administered 2012-05-11 – 2012-05-12 (×2): 240 mg via ORAL
  Filled 2012-05-11 (×3): qty 1

## 2012-05-11 MED ORDER — INSULIN GLARGINE 100 UNIT/ML ~~LOC~~ SOLN
25.0000 [IU] | Freq: Every day | SUBCUTANEOUS | Status: DC
  Administered 2012-05-11 – 2012-05-12 (×2): 25 [IU] via SUBCUTANEOUS
  Filled 2012-05-11: qty 1
  Filled 2012-05-11: qty 25

## 2012-05-11 MED ORDER — LORAZEPAM 1 MG PO TABS
1.0000 mg | ORAL_TABLET | Freq: Three times a day (TID) | ORAL | Status: DC | PRN
  Administered 2012-05-11: 1 mg via ORAL
  Filled 2012-05-11 (×2): qty 1

## 2012-05-11 MED ORDER — METFORMIN HCL 500 MG PO TABS
1000.0000 mg | ORAL_TABLET | Freq: Two times a day (BID) | ORAL | Status: DC
  Administered 2012-05-11 – 2012-05-12 (×3): 1000 mg via ORAL
  Filled 2012-05-11 (×6): qty 2

## 2012-05-11 MED ORDER — DULOXETINE HCL 60 MG PO CPEP
60.0000 mg | ORAL_CAPSULE | Freq: Every day | ORAL | Status: DC
  Administered 2012-05-11 – 2012-05-12 (×2): 60 mg via ORAL
  Filled 2012-05-11 (×3): qty 1

## 2012-05-11 MED ORDER — SIMVASTATIN 5 MG PO TABS
5.0000 mg | ORAL_TABLET | Freq: Every day | ORAL | Status: DC
  Administered 2012-05-11 – 2012-05-12 (×2): 5 mg via ORAL
  Filled 2012-05-11 (×3): qty 1

## 2012-05-11 MED ORDER — GABAPENTIN 300 MG PO CAPS
300.0000 mg | ORAL_CAPSULE | Freq: Three times a day (TID) | ORAL | Status: DC
  Administered 2012-05-11 – 2012-05-12 (×5): 300 mg via ORAL
  Filled 2012-05-11 (×10): qty 1

## 2012-05-11 MED ORDER — DICLOFENAC SODIUM 1 % TD GEL
1.0000 "application " | Freq: Four times a day (QID) | TRANSDERMAL | Status: DC
  Administered 2012-05-11 – 2012-05-12 (×5): 1 via TOPICAL
  Filled 2012-05-11: qty 100

## 2012-05-11 MED ORDER — ETODOLAC 500 MG PO TABS: 500.0000 mg | ORAL_TABLET | Freq: Two times a day (BID) | ORAL | Status: DC

## 2012-05-11 MED ORDER — HYDROXYZINE HCL 25 MG PO TABS
50.0000 mg | ORAL_TABLET | Freq: Every day | ORAL | Status: DC
  Administered 2012-05-11 – 2012-05-12 (×2): 50 mg via ORAL
  Filled 2012-05-11 (×2): qty 2

## 2012-05-11 MED ORDER — METOPROLOL SUCCINATE ER 25 MG PO TB24: 25.0000 mg | ORAL_TABLET | Freq: Every day | ORAL | Status: DC

## 2012-05-11 MED ORDER — ALLOPURINOL 100 MG PO TABS
200.0000 mg | ORAL_TABLET | Freq: Every day | ORAL | Status: DC
  Administered 2012-05-11 – 2012-05-12 (×2): 200 mg via ORAL
  Filled 2012-05-11 (×3): qty 2

## 2012-05-11 MED ORDER — ETODOLAC 300 MG PO CAPS
500.0000 mg | ORAL_CAPSULE | Freq: Two times a day (BID) | ORAL | Status: DC
  Administered 2012-05-11 – 2012-05-12 (×4): 500 mg via ORAL
  Filled 2012-05-11 (×7): qty 1

## 2012-05-11 MED ORDER — ASPIRIN 81 MG PO CHEW
81.0000 mg | CHEWABLE_TABLET | Freq: Every day | ORAL | Status: DC
  Administered 2012-05-11 – 2012-05-12 (×2): 81 mg via ORAL
  Filled 2012-05-11 (×2): qty 1

## 2012-05-11 MED ORDER — ACETAMINOPHEN 325 MG PO TABS: 650.0000 mg | ORAL_TABLET | ORAL | Status: DC | PRN

## 2012-05-11 MED ORDER — EMTRICITAB-RILPIVIR-TENOFOV DF 200-25-300 MG PO TABS: 1.0000 | ORAL_TABLET | Freq: Every day | ORAL | Status: DC

## 2012-05-11 MED ORDER — PAROXETINE HCL 20 MG PO TABS
20.0000 mg | ORAL_TABLET | Freq: Every day | ORAL | Status: DC
  Administered 2012-05-11 – 2012-05-12 (×2): 20 mg via ORAL
  Filled 2012-05-11 (×3): qty 1

## 2012-05-11 MED ORDER — EMTRICITABINE-TENOFOVIR DF 200-300 MG PO TABS
1.0000 | ORAL_TABLET | Freq: Every day | ORAL | Status: DC
  Administered 2012-05-11 – 2012-05-12 (×2): 1 via ORAL
  Filled 2012-05-11 (×3): qty 1

## 2012-05-11 MED ORDER — NICOTINE 21 MG/24HR TD PT24: 21.0000 mg | MEDICATED_PATCH | Freq: Every day | TRANSDERMAL | Status: DC

## 2012-05-11 NOTE — ED Provider Notes (Signed)
Medical screening examination/treatment/procedure(s) were performed by non-physician practitioner and as supervising physician I was immediately available for consultation/collaboration.  Mervin Kung, MD 05/11/12 [phone]

## 2012-05-11 NOTE — ED Provider Notes (Signed)
History     CSN: [id number]  Arrival date & time 05/11/12  [id number]   First MD Initiated Contact with Patient 05/11/12 1006      Chief Complaint  Patient presents with  . Addiction Problem  . Medical Clearance    (Consider location/radiation/quality/duration/timing/severity/associated sxs/prior treatment) HPI  61 year old male history of drug abuse presents requesting for drug detox from crack cocaine.  Sts he has been using a large amount of cracks by trying to numb himself from his addiction and his SI.  Sts he is tired of life, and has had thoughts of cutting his wrist, walkout in front of a car, or cutting himself with scissors.  Also has auditory hallucination of voices telling him to hurt himself.  Did "bang my head against wall" today.  Denies loc but does endorse headache to forehead.  Denies HI.  Request "i need help before i hurt myself".    Past Medical History  Diagnosis Date  . Diabetes mellitus   . Hypertension   . Angina   . Shortness of breath   . Headache   . Gout   . Hyperlipidemia   . HIV infection   . Crack cocaine use     Past Surgical History  Procedure Date  . Prostate surgery   . Bowel resection   . Back surgery     1988    Family History  Problem Relation Age of Onset  . Diabetes Mother   . Diabetes Father   . Diabetes Brother     History  Substance Use Topics  . Smoking status: Never Smoker   . Smokeless tobacco: Never Used  . Alcohol Use: No      Review of Systems  Constitutional: Negative for fever.  HENT: Negative for neck pain.   Respiratory: Negative for shortness of breath.   Cardiovascular: Negative for chest pain.  Gastrointestinal: Negative for abdominal pain.  Skin: Negative for wound.  Neurological: Positive for headaches. Negative for numbness.  Psychiatric/Behavioral: Positive for suicidal ideas and self-injury. The patient is nervous/anxious.   All other systems reviewed and are negative.    Allergies  Review of  patient's allergies indicates no known allergies.  Home Medications   Current Outpatient Rx  Name Route Sig Dispense Refill  . ALLOPURINOL 100 MG PO TABS Oral Take 2 tablets (200 mg total) by mouth every morning. For gout 60 tablet 0  . ASPIRIN 81 MG PO CHEW Oral Chew 1 tablet (81 mg total) by mouth daily. For blood thinner 30 tablet 0  . DICLOFENAC SODIUM 1 % TD GEL Topical Apply 1 application topically 4 (four) times daily. 1 Tube 1  . DILTIAZEM HCL ER COATED BEADS 240 MG PO CP24 Oral Take 1 capsule (240 mg total) by mouth daily. For blood pressure 30 capsule 0  . DULOXETINE HCL 60 MG PO CPEP Oral Take 1 capsule (60 mg total) by mouth daily. 30 capsule 6  . EMTRICITAB-RILPIVIR-TENOFOVIR 200-25-300 MG PO TABS Oral Take 1 tablet by mouth daily. 30 tablet 6  . ETODOLAC 500 MG PO TABS Oral Take 1 tablet (500 mg total) by mouth 2 (two) times daily. 20 tablet 0  . GABAPENTIN 300 MG PO CAPS Oral Take 1 capsule (300 mg total) by mouth 3 (three) times daily. For anxiety 90 capsule 6  . GLUCOSE BLOOD VI STRP  Use as instructed 100 each 12  . FREESTYLE SYSTEM KIT Does not apply 1 each by Does not apply route as needed for other.  1 each 0  . HYDROXYZINE HCL 50 MG PO TABS Oral Take 50 mg by mouth at bedtime.    . INSULIN GLARGINE 100 UNIT/ML [place] SOLN Subcutaneous Inject 25 Units into the skin at bedtime. 10 mL 0  . FREESTYLE LANCETS MISC  Use as instructed 100 each 12  . MELOXICAM 15 MG PO TABS Oral Take 1 tablet (15 mg total) by mouth daily. 20 tablet 0  . METFORMIN HCL 1000 MG PO TABS Oral Take 1 tablet (1,000 mg total) by mouth 2 (two) times daily with a meal. For sugar 60 tablet 0  . METOPROLOL SUCCINATE ER 25 MG PO TB24 Oral Take 1 tablet (25 mg total) by mouth daily. For blood pressure 30 tablet 11  . PAROXETINE HCL 20 MG PO TABS Oral Take 20 mg by mouth every morning.    Marland Kitchen PRAVASTATIN SODIUM 40 MG PO TABS Oral Take 1 tablet (40 mg total) by mouth every evening. 30 tablet 11    BP 141/84  Pulse  94  Temp 98.6 F (37 C) (Oral)  Resp 16  Ht 5' 7.5" (1.715 m)  Wt 165 lb (74.844 kg)  BMI 25.46 kg/m2  SpO2 99%  Physical Exam  Nursing note and vitals reviewed. Constitutional: He appears well-developed and well-nourished. No distress.       Awake, alert, nontoxic appearance  HENT:  Head: Atraumatic.  Eyes: Conjunctivae are normal. Right eye exhibits no discharge. Left eye exhibits no discharge.  Neck: Normal range of motion. Neck supple.  Cardiovascular: Normal rate and regular rhythm.   Pulmonary/Chest: Effort normal. No respiratory distress. He exhibits no tenderness.  Abdominal: Soft. There is no tenderness. There is no rebound.  Musculoskeletal: He exhibits no tenderness.       ROM appears intact, no obvious focal weakness  Neurological: He is alert.  Skin: Skin is warm and dry. No rash noted.  Psychiatric: His speech is normal. He is slowed. Thought content is delusional. He expresses impulsivity. He exhibits a depressed mood. He expresses suicidal ideation.    ED Course  Procedures (including critical care time)   Labs Reviewed  CBC  COMPREHENSIVE METABOLIC PANEL  ETHANOL  URINE RAPID DRUG SCREEN (HOSP PERFORMED)   Results for orders placed during the hospital encounter of 05/11/12  CBC      Component Value Range   WBC 3.0 (*) 4.0 - 10.5 K/uL   RBC 4.60  4.22 - 5.81 MIL/uL   Hemoglobin 12.6 (*) 13.0 - 17.0 g/dL   HCT 36.6 (*) 39.0 - 52.0 %   MCV 79.6  78.0 - 100.0 fL   MCH 27.4  26.0 - 34.0 pg   MCHC 34.4  30.0 - 36.0 g/dL   RDW 12.8  11.5 - 15.5 %   Platelets 157  150 - 400 K/uL  COMPREHENSIVE METABOLIC PANEL      Component Value Range   Sodium 137  135 - 145 mEq/L   Potassium 3.3 (*) 3.5 - 5.1 mEq/L   Chloride 103  96 - 112 mEq/L   CO2 23  19 - 32 mEq/L   Glucose, Bld 262 (*) 70 - 99 mg/dL   BUN 16  6 - 23 mg/dL   Creatinine, Ser 1.10  0.50 - 1.35 mg/dL   Calcium 9.5  8.4 - 10.5 mg/dL   Total Protein 7.5  6.0 - 8.3 g/dL   Albumin 4.1  3.5 - 5.2  g/dL   AST 18  0 - 37 U/L   ALT  14  0 - 53 U/L   Alkaline Phosphatase 69  39 - 117 U/L   Total Bilirubin 0.6  0.3 - 1.2 mg/dL   GFR calc non Af Amer 70 (*) >90 mL/min   GFR calc Af Amer 81 (*) >90 mL/min  ETHANOL      Component Value Range   Alcohol, Ethyl (B) <11  0 - 11 mg/dL  URINE RAPID DRUG SCREEN (HOSP PERFORMED)      Component Value Range   Opiates NONE DETECTED  NONE DETECTED   Cocaine POSITIVE (*) NONE DETECTED   Benzodiazepines NONE DETECTED  NONE DETECTED   Amphetamines NONE DETECTED  NONE DETECTED   Tetrahydrocannabinol NONE DETECTED  NONE DETECTED   Barbiturates NONE DETECTED  NONE DETECTED   Mr Knee Left  Wo Contrast  04/28/2012  *RADIOLOGY REPORT*  Clinical Data:  Medial and anterior knee pain since a fall 2 months ago.  MRI OF THE LEFT KNEE WITHOUT CONTRAST  Technique:  Multiplanar, multisequence MR imaging of the left knee was performed.  No intravenous contrast was administered.  Comparison:  Radiographs dated 03/03/2012  FINDINGS: MENISCI Medial:  There is peripheral degeneration of the posterior horn of the medial meniscus at the meniscocapsular junction without a definable meniscal tear.  Medial meniscus is otherwise normal. Lateral:  Normal.  LIGAMENTS Cruciates:  Normal. Collaterals:  Normal.  CARTILAGE Patellofemoral:  Normal. Medial:  Focal fissures and irregularity of the cartilage of the mid to posterior aspect of the medial femoral condyle. Lateral:  Normal.  Joint:  No joint effusion. Popliteal Fossa:  Small Baker's cyst. Extensor Mechanism:  Normal. Bones: Minimal marginal spurring of the medial and lateral tibial plateaus.  IMPRESSION: Chondromalacia of the medial femoral condyle with slight peripheral degeneration of the meniscocapsular junction of the medial meniscus.  Small Baker's cyst.  Original Report Authenticated By: Larey Seat, M.D.    1. Drug detox 2. SI 3. Auditory hallucination 4. hyperglycemia   MDM  Pt requesting detox from crack cocain  use.  Did sts he hits his forehead against wall.  Head exam unremarkable.  Mild ttp to L forehead without overlying skin changes.  Work up initiated.     11:06 AM Pt is medically cleared for psych eval. Psych ED holding order and Med rec order filled.  Discussed with my attending.     BP 141/84  Pulse 94  Temp 98.6 F (37 C) (Oral)  Resp 16  Ht 5' 7.5" (1.715 m)  Wt 165 lb (74.844 kg)  BMI 25.46 kg/m2  SpO2 99%    11:49 AM I have consulted with ACT team, who agrees for continual care.  Pt to be moved to psych ED.    Domenic Moras, PA-C 05/11/12 1232

## 2012-05-11 NOTE — ED Notes (Signed)
Pt belongings are in [facility] locker 33. 1 belongings bag.

## 2012-05-11 NOTE — Telephone Encounter (Signed)
[facility]aw [facility] on 01/01/12 at [address]. Will ask front office top call for office notes

## 2012-05-11 NOTE — ED Notes (Signed)
Appetite good.  

## 2012-05-11 NOTE — ED Notes (Signed)
Pt reports crack cocaine binge since 7/31. Last use 2hr PTA. Unsure how much he uses in a day but has used $1200 worth since 7/31. Presents requesting detox and rehab. Reports SI "for a while." Plan to walk out in front of a car, or cut wrists. Reports he has been "sticking myself" with scissors. Small puncture wound noted to L palm. Denies HI.

## 2012-05-11 NOTE — ED Notes (Signed)
Unable to give morning meds at this moment, pharmacy still verifying home meds.

## 2012-05-11 NOTE — ED Notes (Signed)
BELONGINGS PUT IN LOCKER 27.

## 2012-05-12 ENCOUNTER — Ambulatory Visit: Payer: Medicare Other | Attending: Family Medicine | Admitting: Rehabilitative and Restorative Service Providers"

## 2012-05-12 ENCOUNTER — Telehealth (HOSPITAL_COMMUNITY): Payer: Self-pay | Admitting: Licensed Clinical Social Worker

## 2012-05-12 ENCOUNTER — Inpatient Hospital Stay (HOSPITAL_COMMUNITY): Admission: AD | Admit: 2012-05-12 | Payer: Medicare Other | Source: Ambulatory Visit | Admitting: Psychiatry

## 2012-05-12 DIAGNOSIS — M25569 Pain in unspecified knee: Secondary | ICD-10-CM | POA: Insufficient documentation

## 2012-05-12 DIAGNOSIS — IMO0001 Reserved for inherently not codable concepts without codable children: Secondary | ICD-10-CM | POA: Insufficient documentation

## 2012-05-12 NOTE — BH Assessment (Addendum)
..   Patient is a 70-year African American male requesting detox.  Patient reports a history of depression and substance abuse. Patient reports that he had a plan to hurt himself with a knife due to his increased feelings of depression. Patient reports that he is tired of life, and has had thoughts of cutting his wrist, walkout in front of a car, or cutting himself with scissors. Patient denies any prior psychiatric hospitalization.  Patient reports using drugs to numb the pain of his depression.  Patient was not able to state the source of his depression.    Patient reports that he has been using every day since May 05, 2012.  Patient reports he has been smoking as much crack as he could get his hand on.  Patient denies using any other controlled substance.  Patient repots one detox in March 2012 at [facility].  Patient UDS was positive for cocaine.  Patients BAL was <11.   Patient reports auditory hallucination of voices telling him to hurt himself. Documented in the file it is reported that the patient did "bang my head against wall" today. Patient denies HI. Patient reports that, "I need help before I hurt myself".   Patient is pending placement at [facility] and [place].

## 2012-05-12 NOTE — ED Notes (Signed)
Per Ellin Mayhew pt declined by Dr. Elaina Hoops d/t "medical acuity", "MD feels patient needs Med Surg Psych placement"

## 2012-05-12 NOTE — BHH Counselor (Signed)
Accepted for admission per Dr Evern Bio to a 68 hall bed when a bed is available.

## 2012-05-12 NOTE — BH Assessment (Addendum)
Assessment Note   Patient is a 48-year African American male requesting detox.  Patient reports a history of depression and substance abuse. Patient reports that he had a plan to hurt himself with a knife due to his increased feelings of depression. Patient reports that he is tired of life, and has had thoughts of cutting his wrist, walkout in front of a car, or cutting himself with scissors. Patient denies any prior psychiatric hospitalization.  Patient reports using drugs to numb the pain of his depression.  Patient was not able to state the source of his depression.    Patient reports that he has been using every day since May 05, 2012.  Patient reports he has been smoking as much crack as he could get his hand on.  Patient denies using any other controlled substance.  Patient repots one detox in March 2012 at [facility].  Patient UDS was positive for cocaine.  Patients BAL was <11.   Patient reports auditory hallucination of voices telling him to hurt himself. Documented in the file it is reported that the patient did "bang my head against wall" today. Patient denies HI. Patient reports that, "I need help before I hurt myself".   Axis I:  Cocaine Dependence Major Depression, Recurrent severe Axis II: Deferred Axis III:  Past Medical History  Diagnosis Date  . Diabetes mellitus   . Hypertension   . Angina   . Shortness of breath   . Headache   . Gout   . Hyperlipidemia   . HIV infection   . Crack cocaine use    Axis IV: economic problems, occupational problems, other psychosocial or environmental problems, problems related to social environment, problems with access to health care services and problems with primary support group Axis V: 41-50 serious symptoms  Past Medical History:  Past Medical History  Diagnosis Date  . Diabetes mellitus   . Hypertension   . Angina   . Shortness of breath   . Headache   . Gout   . Hyperlipidemia   . HIV infection   . Crack cocaine use     Past  Surgical History  Procedure Date  . Prostate surgery   . Bowel resection   . Back surgery     1988    Family History:  Family History  Problem Relation Age of Onset  . Diabetes Mother   . Diabetes Father   . Diabetes Brother     Social History:  reports that he has never smoked. He has never used smokeless tobacco. He reports that he uses illicit drugs ("Crack" cocaine and Cocaine). He reports that he does not drink alcohol.  Additional Social History:     CIWA: CIWA-Ar BP: 103/69 mmHg Pulse Rate: 94  COWS:    Allergies: No Known Allergies  Home Medications:  (Not in a hospital admission)  OB/GYN Status:  No LMP for male patient.        Risk to self Is patient at risk for suicide?: No Substance abuse history and/or treatment for substance abuse?: Yes                                    Values / Beliefs Cultural Requests During Hospitalization: None Spiritual Requests During Hospitalization: None              Disposition: Pending placement at [facility] and [name].     On Site Evaluation  by:   Reviewed with Physician:     Graciella Freer LaVerne 05/12/2012 12:53 AM

## 2012-05-13 ENCOUNTER — Encounter: Payer: Self-pay | Admitting: Internal Medicine

## 2012-05-13 ENCOUNTER — Encounter (HOSPITAL_COMMUNITY): Payer: Self-pay | Admitting: *Deleted

## 2012-05-13 ENCOUNTER — Inpatient Hospital Stay (HOSPITAL_COMMUNITY)
Admission: AD | Admit: 2012-05-13 | Discharge: 2012-05-19 | DRG: 885 | Payer: 59 | Source: Ambulatory Visit | Attending: Psychiatry | Admitting: Psychiatry

## 2012-05-13 DIAGNOSIS — IMO0001 Reserved for inherently not codable concepts without codable children: Secondary | ICD-10-CM | POA: Diagnosis present

## 2012-05-13 DIAGNOSIS — Z79899 Other long term (current) drug therapy: Secondary | ICD-10-CM

## 2012-05-13 DIAGNOSIS — M109 Gout, unspecified: Secondary | ICD-10-CM | POA: Diagnosis present

## 2012-05-13 DIAGNOSIS — Z21 Asymptomatic human immunodeficiency virus [HIV] infection status: Secondary | ICD-10-CM | POA: Diagnosis present

## 2012-05-13 DIAGNOSIS — M25569 Pain in unspecified knee: Secondary | ICD-10-CM | POA: Diagnosis not present

## 2012-05-13 DIAGNOSIS — F32A Depression, unspecified: Secondary | ICD-10-CM

## 2012-05-13 DIAGNOSIS — Z7982 Long term (current) use of aspirin: Secondary | ICD-10-CM

## 2012-05-13 DIAGNOSIS — M7989 Other specified soft tissue disorders: Secondary | ICD-10-CM | POA: Diagnosis not present

## 2012-05-13 DIAGNOSIS — F332 Major depressive disorder, recurrent severe without psychotic features: Principal | ICD-10-CM | POA: Diagnosis present

## 2012-05-13 DIAGNOSIS — Z794 Long term (current) use of insulin: Secondary | ICD-10-CM

## 2012-05-13 DIAGNOSIS — F1410 Cocaine abuse, uncomplicated: Secondary | ICD-10-CM | POA: Diagnosis present

## 2012-05-13 DIAGNOSIS — F329 Major depressive disorder, single episode, unspecified: Secondary | ICD-10-CM

## 2012-05-13 DIAGNOSIS — I10 Essential (primary) hypertension: Secondary | ICD-10-CM | POA: Diagnosis present

## 2012-05-13 DIAGNOSIS — E119 Type 2 diabetes mellitus without complications: Secondary | ICD-10-CM | POA: Diagnosis present

## 2012-05-13 DIAGNOSIS — F333 Major depressive disorder, recurrent, severe with psychotic symptoms: Secondary | ICD-10-CM | POA: Diagnosis present

## 2012-05-13 DIAGNOSIS — E785 Hyperlipidemia, unspecified: Secondary | ICD-10-CM | POA: Diagnosis present

## 2012-05-13 LAB — GLUCOSE, CAPILLARY
Glucose-Capillary: 196 mg/dL — ABNORMAL HIGH (ref 70–99)
Glucose-Capillary: 101 mg/dL — ABNORMAL HIGH (ref 70–99)
Glucose-Capillary: 303 mg/dL — ABNORMAL HIGH (ref 70–99)

## 2012-05-13 LAB — BASIC METABOLIC PANEL
BUN: 14 mg/dL (ref 6–23)
CO2: 25 mEq/L (ref 19–32)
Chloride: 104 mEq/L (ref 96–112)
Creatinine, Ser: 1.04 mg/dL (ref 0.50–1.35)
GFR calc Af Amer: 87 mL/min — ABNORMAL LOW (ref >90)
Glucose, Bld: 107 mg/dL — ABNORMAL HIGH (ref 70–99)
Potassium: 3.7 mEq/L (ref 3.5–5.1)

## 2012-05-13 MED ORDER — GLUCERNA SHAKE PO LIQD
237.0000 mL | Freq: Every day | ORAL | Status: DC
  Administered 2012-05-13 – 2012-05-18 (×6): 237 mL via ORAL

## 2012-05-13 MED ORDER — METFORMIN HCL 500 MG PO TABS
1000.0000 mg | ORAL_TABLET | Freq: Two times a day (BID) | ORAL | Status: DC
  Administered 2012-05-13 – 2012-05-18 (×11): 1000 mg via ORAL
  Filled 2012-05-13 (×14): qty 2

## 2012-05-13 MED ORDER — GABAPENTIN 300 MG PO CAPS
300.0000 mg | ORAL_CAPSULE | Freq: Three times a day (TID) | ORAL | Status: DC
  Administered 2012-05-13 – 2012-05-14 (×4): 300 mg via ORAL
  Administered 2012-05-15: 100 mg via ORAL
  Administered 2012-05-15: 300 mg via ORAL
  Filled 2012-05-13 (×9): qty 1

## 2012-05-13 MED ORDER — INSULIN ASPART 100 UNIT/ML ~~LOC~~ SOLN
0.0000 [IU] | Freq: Three times a day (TID) | SUBCUTANEOUS | Status: DC
  Administered 2012-05-13: 11 [IU] via SUBCUTANEOUS
  Administered 2012-05-14: 3 [IU] via SUBCUTANEOUS
  Administered 2012-05-15: 2 [IU] via SUBCUTANEOUS
  Administered 2012-05-15 (×2): 3 [IU] via SUBCUTANEOUS
  Administered 2012-05-16: 2 [IU] via SUBCUTANEOUS
  Administered 2012-05-16 – 2012-05-17 (×3): 3 [IU] via SUBCUTANEOUS
  Administered 2012-05-17: 2 [IU] via SUBCUTANEOUS
  Administered 2012-05-17: 3 [IU] via SUBCUTANEOUS
  Administered 2012-05-18: 2 [IU] via SUBCUTANEOUS
  Administered 2012-05-18: 3 [IU] via SUBCUTANEOUS

## 2012-05-13 MED ORDER — MELOXICAM 7.5 MG PO TABS
15.0000 mg | ORAL_TABLET | Freq: Every day | ORAL | Status: DC
  Administered 2012-05-13 – 2012-05-18 (×6): 15 mg via ORAL
  Filled 2012-05-13 (×8): qty 2

## 2012-05-13 MED ORDER — INSULIN GLARGINE 100 UNIT/ML ~~LOC~~ SOLN
25.0000 [IU] | Freq: Every day | SUBCUTANEOUS | Status: DC
  Administered 2012-05-13 – 2012-05-18 (×6): 25 [IU] via SUBCUTANEOUS
  Filled 2012-05-13: qty 3

## 2012-05-13 MED ORDER — ASPIRIN 81 MG PO CHEW
81.0000 mg | CHEWABLE_TABLET | Freq: Every day | ORAL | Status: DC
  Administered 2012-05-13 – 2012-05-18 (×6): 81 mg via ORAL
  Filled 2012-05-13 (×8): qty 1

## 2012-05-13 MED ORDER — HYDROXYZINE HCL 50 MG PO TABS
50.0000 mg | ORAL_TABLET | Freq: Every day | ORAL | Status: DC
  Administered 2012-05-13: 50 mg via ORAL
  Filled 2012-05-13 (×2): qty 1

## 2012-05-13 MED ORDER — ALLOPURINOL 100 MG PO TABS
200.0000 mg | ORAL_TABLET | Freq: Every morning | ORAL | Status: DC
  Administered 2012-05-13 – 2012-05-18 (×6): 200 mg via ORAL
  Filled 2012-05-13 (×8): qty 2

## 2012-05-13 MED ORDER — PAROXETINE HCL 20 MG PO TABS
20.0000 mg | ORAL_TABLET | ORAL | Status: DC
  Administered 2012-05-14: 20 mg via ORAL
  Filled 2012-05-13 (×2): qty 1

## 2012-05-13 MED ORDER — INSULIN ASPART 100 UNIT/ML ~~LOC~~ SOLN
4.0000 [IU] | Freq: Three times a day (TID) | SUBCUTANEOUS | Status: DC
  Administered 2012-05-13 – 2012-05-17 (×12): 4 [IU] via SUBCUTANEOUS
  Administered 2012-05-17: 12:00:00 via SUBCUTANEOUS
  Administered 2012-05-18 (×3): 4 [IU] via SUBCUTANEOUS
  Filled 2012-05-13: qty 3

## 2012-05-13 MED ORDER — ETODOLAC 500 MG PO TABS: 500.0000 mg | ORAL_TABLET | Freq: Two times a day (BID) | ORAL | Status: DC

## 2012-05-13 MED ORDER — ALUM & MAG HYDROXIDE-SIMETH 200-200-20 MG/5ML PO SUSP
30.0000 mL | ORAL | Status: DC | PRN
Start: 2012-05-13 — End: 2012-05-19

## 2012-05-13 MED ORDER — DULOXETINE HCL 60 MG PO CPEP
60.0000 mg | ORAL_CAPSULE | Freq: Every day | ORAL | Status: DC
  Administered 2012-05-13 – 2012-05-18 (×6): 60 mg via ORAL
  Filled 2012-05-13 (×9): qty 1

## 2012-05-13 MED ORDER — EMTRICITAB-RILPIVIR-TENOFOV DF 200-25-300 MG PO TABS
1.0000 | ORAL_TABLET | Freq: Every day | ORAL | Status: DC
  Administered 2012-05-13 – 2012-05-18 (×6): 1 via ORAL
  Filled 2012-05-13 (×8): qty 1

## 2012-05-13 MED ORDER — AMITRIPTYLINE HCL 25 MG PO TABS
25.0000 mg | ORAL_TABLET | Freq: Every day | ORAL | Status: DC
  Administered 2012-05-13: 25 mg via ORAL
  Filled 2012-05-13 (×3): qty 1

## 2012-05-13 MED ORDER — SIMVASTATIN 20 MG PO TABS
20.0000 mg | ORAL_TABLET | Freq: Every day | ORAL | Status: DC
  Administered 2012-05-13 – 2012-05-18 (×6): 20 mg via ORAL
  Filled 2012-05-13 (×7): qty 1

## 2012-05-13 MED ORDER — ACETAMINOPHEN 325 MG PO TABS: 650.0000 mg | ORAL_TABLET | Freq: Four times a day (QID) | ORAL | Status: DC | PRN

## 2012-05-13 MED ORDER — DILTIAZEM HCL ER COATED BEADS 240 MG PO CP24
240.0000 mg | ORAL_CAPSULE | Freq: Every day | ORAL | Status: DC
  Administered 2012-05-13 – 2012-05-18 (×6): 240 mg via ORAL
  Filled 2012-05-13 (×8): qty 1

## 2012-05-13 MED ORDER — MAGNESIUM HYDROXIDE 400 MG/5ML PO SUSP
30.0000 mL | Freq: Every day | ORAL | Status: DC | PRN
  Administered 2012-05-18: 30 mL via ORAL

## 2012-05-13 MED ORDER — METOPROLOL SUCCINATE ER 25 MG PO TB24
25.0000 mg | ORAL_TABLET | Freq: Every day | ORAL | Status: DC
  Administered 2012-05-13 – 2012-05-18 (×6): 25 mg via ORAL
  Filled 2012-05-13 (×9): qty 1

## 2012-05-13 MED ORDER — DICLOFENAC SODIUM 1 % TD GEL
1.0000 "application " | Freq: Four times a day (QID) | TRANSDERMAL | Status: DC
  Administered 2012-05-13 – 2012-05-18 (×23): 1 via TOPICAL
  Filled 2012-05-13: qty 100

## 2012-05-13 MED ORDER — ETODOLAC 300 MG PO CAPS
500.0000 mg | ORAL_CAPSULE | Freq: Two times a day (BID) | ORAL | Status: DC
  Filled 2012-05-13 (×4): qty 1

## 2012-05-13 NOTE — Progress Notes (Signed)
Nutrition Brief Note  Patient identified on the Nutrition Risk Report for unintended weight loss greater than 10 pounds in the past month.   Body mass index is 25.50 kg/(m^2). Pt meets criteria for overweight based on current BMI.   Lab Results  Component Value Date   HGBA1C 9.0 04/29/2012   - Pt reports eating 1 meal/day for the past month r/t drug abuse. Pt reports losing 10 pounds unintentionally in the past 10 days. Pt reports his blood sugars used to be running around 200 mg/dLs but the last time he checked his blood sugars at home it was 109 mg/dL. Pt reports being interested in improving his nutrition. Pt states he plans on being discharged to a long term care facility r/t needing to get away from his home environment which has a lot of substance abuse, addition, and dealings.   Pt meets criteria for severe protein calorie malnutrition as evidenced by <50% estimated energy intake with 5.9% weight loss in the past 10 days PTA per pt report.  Intervention: Glucerna shake as nighttime snack. Discussed nutrition therapy for weight gain and importance of regular meal intake throughout the day for blood sugar control.  No further nutrition intervention needed at this time.   Dietitian# [phone]

## 2012-05-13 NOTE — Progress Notes (Signed)
[place] Group Notes:  (Counselor/Nursing/MHT/Case Management/Adjunct)  05/13/2012 10:09 PM  Type of Therapy:  NA Meeting  Participation Level:  Active  Participation Quality:  Appropriate  Affect:  Appropriate  Cognitive:  Appropriate  Insight:  Good  Engagement in Group:  Good  Engagement in Therapy:  Good  Modes of Intervention:  Support  Summary of Progress/Problems:   Michael [name] 05/13/2012, 10:09 PMThe focus of this group is to help patients review their daily goal of treatment and discuss progress on daily workbooks.

## 2012-05-13 NOTE — ED Notes (Signed)
Pt's  cbg is 66.pt a symptomatic and is give orange juice and graham crackers. Will re-check cgb

## 2012-05-13 NOTE — Progress Notes (Signed)
Patient ID: Michael [name], male   DOB: 11-29-1950, 61 y.o.   MRN: [id number]  Patient came into the emergency department requesting detox and had a plan to hurt himself;  Patient admits to self harm thoughts but no plan expressed at this time; denies HI and A/V hallucinations at this time but patient admits that he does hear voices when he is using the crack/cocaine; patient has only been compliant with his lantus, metformin, and complera; patient has a scar on his back from his surgery; but no open areas on skin; patient has a history of HIV and he is a diabetic; patient has his belongings in locker 23;

## 2012-05-13 NOTE — ED Notes (Signed)
EDMD  Notified of pt d/c instructions. edmd states pt does not need d/c instructions for bh

## 2012-05-13 NOTE — Progress Notes (Signed)
Inpatient Diabetes Program Recommendations  AACE/ADA: New Consensus Statement on Inpatient Glycemic Control (2013)  Target Ranges:  Prepandial:   less than 140 mg/dL      Peak postprandial:   less than 180 mg/dL (1-2 hours)      Critically ill patients:  140 - 180 mg/dL   Reason for Visit: Hyperglycemia  Results for Michael [name], Michael [name] (MRN [id number]) as of 05/13/2012 15:34  Ref. Range 05/11/2012 09:43 05/11/2012 10:02  Sodium Latest Range: 135-145 mEq/L  137  Potassium Latest Range: 3.5-5.1 mEq/L  3.3 (L)  Chloride Latest Range: 96-112 mEq/L  103  CO2 Latest Range: 19-32 mEq/L  23  BUN Latest Range: 6-23 mg/dL  16  Creat Latest Range: 0.50-1.35 mg/dL  1.10  Calcium Latest Range: 8.4-10.5 mg/dL  9.5  GFR calc non Af Amer Latest Range: >90 mL/min  70 (L)  GFR calc Af Amer Latest Range: >90 mL/min  81 (L)  Glucose Latest Range: 70-99 mg/dL  262 (H)  Results for Michael [name], Michael [name] (MRN [id number]) as of 05/13/2012 15:34  Ref. Range 04/29/2012 13:36  Hemoglobin A1C Latest Range: <5.7 % 9.0   Patient came into the emergency department requesting detox and had a plan to hurt himself; Patient admits to self harm thoughts but no plan expressed at this time; denies HI and A/V hallucinations at this time but patient admits that he does hear voices when he is using the crack/cocaine; patient has only been compliant with his lantus, metformin, and complera; patient has a scar on his back from his surgery; but no open areas on skin; patient has a history of HIV and he is a diabetic.  Agree with Lantus 25 units QHS, Novolog 4 units tid with moderate correction tidwc, metformin 1000 mg bid.  Will followup in am.

## 2012-05-13 NOTE — Tx Team (Signed)
Initial Interdisciplinary Treatment Plan  PATIENT STRENGTHS: (choose at least two) Average or above average intelligence Capable of independent living Communication skills  PATIENT STRESSORS: Health problems Medication change or noncompliance Substance abuse   PROBLEM LIST: Problem List/Patient Goals Date to be addressed Date deferred Reason deferred Estimated date of resolution                                                         DISCHARGE CRITERIA:  Ability to meet basic life and health needs Adequate post-discharge living arrangements Improved stabilization in mood, thinking, and/or behavior Motivation to continue treatment in a less acute level of care Verbal commitment to aftercare and medication compliance Withdrawal symptoms are absent or subacute and managed without 24-hour nursing intervention  PRELIMINARY DISCHARGE PLAN: Attend aftercare/continuing care group Placement in alternative living arrangements  PATIENT/FAMIILY INVOLVEMENT: This treatment plan has been presented to and reviewed with the patient, Michael [name].  The patient and family have been given the opportunity to ask questions and make suggestions.  Michael [name] M 05/13/2012, 1:05 PM

## 2012-05-13 NOTE — BH Assessment (Signed)
[facility] contacted Therapist and informed me that the patient was accepted to [facility]. Therapist informed the ER MD so that he could be admitted to [facility].  Therapist completed the utilization review sheet and updated the patients disposition.

## 2012-05-13 NOTE — [facility] Suicide Risk Assessment (Signed)
Suicide Risk Assessment  Admission Assessment     Demographic factors:  Assessment Details Time of Assessment: Admission Information Obtained From: Patient Current Mental Status:  Current Mental Status: Self-harm thoughts (contracts for safety and no plan) Loss Factors:  Loss Factors: Decline in physical health Historical Factors:    Risk Reduction Factors:  Risk Reduction Factors: Sense of responsibility to family;Religious beliefs about death  CLINICAL FACTORS:   Depression:   Anhedonia Comorbid alcohol abuse/dependence Insomnia Alcohol/Substance Abuse/Dependencies Chronic Pain Previous Psychiatric Diagnoses and Treatments  COGNITIVE FEATURES THAT CONTRIBUTE TO RISK:  Thought constriction (tunnel vision)    SUICIDE RISK:   Moderate:  Frequent suicidal ideation with limited intensity, and duration, some specificity in terms of plans, no associated intent, good self-control, limited dysphoria/symptomatology, some risk factors present, and identifiable protective factors, including available and accessible social support.  Reason for hospitalization: .Suicidal thoughts in the face of addictive drug use. Diagnosis:   Axis I: Major Depression, Recurrent severe, Substance Induced Mood Disorder and Mood disorder due to general medical condition, Cocaine Dependence Axis II: Deferred Axis III:  Past Medical History  Diagnosis Date  . Diabetes mellitus   . Hypertension   . Angina   . Shortness of breath   . Headache   . Gout   . Hyperlipidemia   . HIV infection   . Crack cocaine use    Axis IV: other psychosocial or environmental problems Axis V: 21-30 behavior considerably influenced by delusions or hallucinations OR serious impairment in judgment, communication OR inability to function in almost all areas  ADL's:  Impaired  Sleep: Poor  Appetite:  Poor  Suicidal Ideation:  Plan:  No Intent:  No Means:  No Homicidal Ideation:  Plan:  No Intent:  No Means:   no  AEB (as evidenced by): pt report now  Mental Status Examination/Evaluation: Objective:  Appearance: Casual  Eye Contact::  Good  Speech:  Clear and Coherent  Volume:  Normal  Mood:  Anxious, Depressed, Hopeless, Irritable and Worthless  Affect:  Congruent  Thought Process:  Coherent  Orientation:  Full  Thought Content:  WDL  Suicidal Thoughts:  Yes.  without intent/plan, prior to admission.  Homicidal Thoughts:  No  Memory:  Immediate;   Fair Recent;   Fair Remote;   Fair  Judgement:  Impaired  Insight:  Lacking  Psychomotor Activity:  Normal  Concentration:  Fair  Recall:  Fair  Akathisia:  No  AIMS (if indicated):     Assets:  Communication Skills Desire for Improvement  Sleep:      Vital Signs:Blood pressure 123/78, pulse 99, temperature 98.1 F (36.7 C), temperature source Oral, resp. rate 17, height 5\' 6"  (1.676 m), weight 71.668 kg (158 lb). Current Medications: Current Facility-Administered Medications  Medication Dose Route Frequency Provider Last Rate Last Dose  . acetaminophen (TYLENOL) tablet 650 mg  650 mg Oral Q6H PRN Maryruth Eve, MD      . allopurinol (ZYLOPRIM) tablet 200 mg  200 mg Oral q morning - 10a Encarnacion Slates, NP   200 mg at 05/13/12 1418  . alum & mag hydroxide-simeth (MAALOX/MYLANTA) 200-200-20 MG/5ML suspension 30 mL  30 mL Oral Q4H PRN Maryruth Eve, MD      . aspirin chewable tablet 81 mg  81 mg Oral Daily Encarnacion Slates, NP   81 mg at 05/13/12 1420  . diclofenac sodium (VOLTAREN) 1 % transdermal gel 1 application  1 application Topical QID Encarnacion Slates, NP   1  application at [id number]  . diltiazem (CARDIZEM CD) 24 hr capsule 240 mg  240 mg Oral Daily Encarnacion Slates, NP   240 mg at 05/13/12 1420  . DULoxetine (CYMBALTA) DR capsule 60 mg  60 mg Oral Daily Encarnacion Slates, NP   60 mg at 05/13/12 1420  . Emtricitab-Rilpivir-Tenofovir 200-25-300 MG TABS 1 tablet  1 tablet Oral Daily Encarnacion Slates, NP   1 tablet at 05/13/12 1422  . feeding  supplement (GLUCERNA SHAKE) liquid 237 mL  237 mL Oral QHS Christie Beckers, RD      . gabapentin (NEURONTIN) capsule 300 mg  300 mg Oral TID Encarnacion Slates, NP   300 mg at 05/13/12 1420  . hydrOXYzine (ATARAX/VISTARIL) tablet 50 mg  50 mg Oral QHS Encarnacion Slates, NP      . insulin aspart (novoLOG) injection 0-15 Units  0-15 Units Subcutaneous TID WC Encarnacion Slates, NP   11 Units at 05/13/12 1421  . insulin aspart (novoLOG) injection 4 Units  4 Units Subcutaneous TID WC Encarnacion Slates, NP   4 Units at 05/13/12 1423  . insulin glargine (LANTUS) injection 25 Units  25 Units Subcutaneous QHS Encarnacion Slates, NP      . magnesium hydroxide (MILK OF MAGNESIA) suspension 30 mL  30 mL Oral Daily PRN Maryruth Eve, MD      . meloxicam (MOBIC) tablet 15 mg  15 mg Oral Daily Encarnacion Slates, NP   15 mg at 05/13/12 1419  . metFORMIN (GLUCOPHAGE) tablet 1,000 mg  1,000 mg Oral BID WC Encarnacion Slates, NP   1,000 mg at 05/13/12 1705  . metoprolol succinate (TOPROL-XL) 24 hr tablet 25 mg  25 mg Oral Daily Encarnacion Slates, NP   25 mg at 05/13/12 1419  . PARoxetine (PAXIL) tablet 20 mg  20 mg Oral Vernice [name], NP      . simvastatin (ZOCOR) tablet 20 mg  20 mg Oral q1800 Encarnacion Slates, NP      . DISCONTD: etodolac (LODINE) capsule 500 mg  500 mg Oral BID Alyson Kuroski-Mazzei, DO      . DISCONTD: etodolac (LODINE) tablet 500 mg  500 mg Oral BID Encarnacion Slates, NP       Facility-Administered Medications Ordered in Other Encounters  Medication Dose Route Frequency Provider Last Rate Last Dose  . DISCONTD: acetaminophen (TYLENOL) tablet 650 mg  650 mg Oral Q4H PRN Domenic Moras, PA-C      . DISCONTD: allopurinol (ZYLOPRIM) tablet 200 mg  200 mg Oral Daily Domenic Moras, PA-C   200 mg at 05/12/12 1022  . DISCONTD: aspirin chewable tablet 81 mg  81 mg Oral Daily Domenic Moras, PA-C   81 mg at 05/12/12 1022  . DISCONTD: diclofenac sodium (VOLTAREN) 1 % transdermal gel 1 application  1 application Topical QID Domenic Moras, PA-C   1  application at [id number]  . DISCONTD: diltiazem (CARDIZEM CD) 24 hr capsule 240 mg  240 mg Oral Daily Domenic Moras, PA-C   240 mg at 05/12/12 1022  . DISCONTD: DULoxetine (CYMBALTA) DR capsule 60 mg  60 mg Oral Daily Domenic Moras, PA-C   60 mg at 05/12/12 1022  . DISCONTD: emtricitabine-tenofovir (TRUVADA) 200-300 MG per tablet 1 tablet  1 tablet Oral Daily Mervin Kung, MD   1 tablet at 05/12/12 1022  . DISCONTD: etodolac (LODINE) capsule 500 mg  500 mg Oral BID Mervin Kung, MD  500 mg at 05/12/12 2159  . DISCONTD: gabapentin (NEURONTIN) capsule 300 mg  300 mg Oral TID Domenic Moras, PA-C   300 mg at 05/12/12 2159  . DISCONTD: hydrOXYzine (ATARAX/VISTARIL) tablet 50 mg  50 mg Oral QHS Domenic Moras, PA-C   50 mg at 05/12/12 2159  . DISCONTD: insulin glargine (LANTUS) injection 25 Units  25 Units Subcutaneous QHS Domenic Moras, PA-C   25 Units at 05/12/12 2159  . DISCONTD: LORazepam (ATIVAN) tablet 1 mg  1 mg Oral Q8H PRN Domenic Moras, PA-C   1 mg at 05/11/12 1757  . DISCONTD: metFORMIN (GLUCOPHAGE) tablet 1,000 mg  1,000 mg Oral BID WC Domenic Moras, PA-C   1,000 mg at 05/12/12 1814  . DISCONTD: nicotine (NICODERM CQ - dosed in mg/24 hours) patch 21 mg  21 mg Transdermal Daily Domenic Moras, PA-C      . DISCONTD: PARoxetine (PAXIL) tablet 20 mg  20 mg Oral Daily Domenic Moras, PA-C   20 mg at 05/12/12 1032  . DISCONTD: rilpivirine (ENDURANT) tablet 25 mg  25 mg Oral Daily Mervin Kung, MD   25 mg at 05/12/12 1022  . DISCONTD: simvastatin (ZOCOR) tablet 5 mg  5 mg Oral q1800 Domenic Moras, PA-C   5 mg at 05/12/12 1814    Lab Results:  Results for orders placed during the hospital encounter of 05/13/12 (from the past 48 hour(s))  GLUCOSE, CAPILLARY     Status: Abnormal   Collection Time   05/13/12  1:52 PM      Component Value Range Comment   Glucose-Capillary 303 (*) 70 - 99 mg/dL   GLUCOSE, CAPILLARY     Status: Abnormal   Collection Time   05/13/12  5:09 PM      Component Value Range Comment    Glucose-Capillary 60 (*) 70 - 99 mg/dL   GLUCOSE, CAPILLARY     Status: Abnormal   Collection Time   05/13/12  5:31 PM      Component Value Range Comment   Glucose-Capillary 53 (*) 70 - 99 mg/dL    Comment 1 Notify RN     GLUCOSE, CAPILLARY     Status: Normal   Collection Time   05/13/12  5:48 PM      Component Value Range Comment   Glucose-Capillary 82  70 - 99 mg/dL     Physical Findings: AIMS: Facial and Oral Movements Muscles of Facial Expression: None, normal Lips and Perioral Area: None, normal Jaw: None, normal Tongue: None, normal,Extremity Movements Upper (arms, wrists, hands, fingers): None, normal Lower (legs, knees, ankles, toes): None, normal, Trunk Movements Neck, shoulders, hips: None, normal, Overall Severity Severity of abnormal movements (highest score from questions above): None, normal Incapacitation due to abnormal movements: None, normal Patient's awareness of abnormal movements (rate only patient's report): No Awareness, Dental Status Current problems with teeth and/or dentures?: Yes (few missing teeth and poor hygiene) Does patient usually wear dentures?: No  CIWA:  CIWA-Ar Total: 0  COWS:  COWS Total Score: 2   Risk: Risk of harm to self is elevated by his chronic pain, depression, and addictions  Risk of harm to others is minimal in that he has not been involved in fights or had any legal charges filed on him.  Treatment Plan Summary: Daily contact with patient to assess and evaluate symptoms and progress in treatment Medication management No signs/symptoms of withdrawal from abusable substances. No suicidal or homicidal thoughts for at least 48 hours. Mood/anxiety less than 3/10 where  the scale is 1 is the best and 10 is the worst  Plan: Admit, Continue non narcotic meds for fibromyalgia, add Elavil for that.  Discussed the risks, benefits, and probable clinical course with and without treatment.  Pt is agreeable to the current course of  treatment. We will continue on q. 15 checks the unit protocol. At this time there is no clinical indication for one-to-one observation as patient contract for safety and presents little risk to harm themself and others.  We will increase collateral information. I encourage patient to participate in group milieu therapy. Pt will be seen in treatment team soon for further treatment and appropriate discharge planning. Please see history and physical note for more detailed information ELOS: 3 to 5 days.   Joaquim Tolen 05/13/2012, 8:09 PM

## 2012-05-13 NOTE — H&P (Signed)
Psychiatric Admission Assessment Adult  Patient Identification:  Michael [name]  Date of Evaluation:  05/13/2012  Chief Complaint:  Cocaine Dependence  History of Present Illness: This is a 61 year old African-american male, admitted to [facility] from the [facility] ED with complaints of cocaine addition and suicidal thoughts, with request for detoxification. Patient reports, "I had gone to the [facility] ED yesterday because I started messing with cocaine again. I also knew that they will send me to this hospital to get more detox treatment. I have been doing cocaine x 20 years. I have not gone through any residential treatment for my substance abuse issues in the past. I would like to go to a rehabilitation place after this hospitalization. I was in this hospital last March for bad depression. I use cocaine to help me deal with the life stressors facing me. I have money problems because I was blowing up my money on drugs. I have multiple health problems that I have to deal with day by day. Drug use and abuse caused me my marriage. I lost all that I have owned and worked for because I am using drugs.  I live in an area that all that is going on there is substance abuse, addition and dealings. On the reverse, using cocaine makes me hear things as well. I was suicidal yesterday because I felt hopeless, I'm tired of using and staying broke".   Mood Symptoms:  Anhedonia, Guilt, Hopelessness, Sadness, SI,  Depression Symptoms:  depressed mood, feelings of worthlessness/guilt, difficulty concentrating, suicidal thoughts with specific plan, anxiety,  (Hypo) Manic Symptoms:  Hallucinations, Impulsivity, Irritable Mood,  Anxiety Symptoms:  Excessive Worry,  Psychotic Symptoms:  Hallucinations: Auditory  PTSD Symptoms: Had a traumatic exposure:  None reported  Past Psychiatric History: Diagnosis: Cocaine abuse, Major depressive disorder, recurrent episodes.    Hospitalizations: [facility]  Outpatient Care: Dr. Dwyane Dee at the [facility]  Substance Abuse Care: None reported  Self-Mutilation: None reported  Suicidal Attempts: Denies attempts, admits thoughts yesterday  Violent Behaviors: None reported   Past Medical History:   Past Medical History  Diagnosis Date  . Diabetes mellitus   . Hypertension   . Angina   . Shortness of breath   . Headache   . Gout   . Hyperlipidemia   . HIV infection   . Crack cocaine use      Allergies:  No Known Allergies  PTA Medications: Prescriptions prior to admission  Medication Sig Dispense Refill  . allopurinol (ZYLOPRIM) 100 MG tablet Take 200 mg by mouth every morning. For gout.      Marland Kitchen aspirin 81 MG chewable tablet Chew 81 mg by mouth daily. For blood thinner.      . diclofenac sodium (VOLTAREN) 1 % GEL Apply 1 application topically 4 (four) times daily. To left knee and right lower leg/ankle.      . diltiazem (CARDIZEM CD) 240 MG 24 hr capsule Take 240 mg by mouth daily.      . DULoxetine (CYMBALTA) 60 MG capsule Take 60 mg by mouth daily.      . Emtricitab-Rilpivir-Tenofovir (COMPLERA) 200-25-300 MG TABS Take 1 tablet by mouth daily.      Marland Kitchen etodolac (LODINE) 500 MG tablet Take 500 mg by mouth 2 (two) times daily.      Marland Kitchen gabapentin (NEURONTIN) 300 MG capsule Take 300 mg by mouth 3 (three) times daily.      . hydrOXYzine (ATARAX/VISTARIL) 50 MG tablet Take 50 mg by  mouth at bedtime.      . insulin glargine (LANTUS) 100 UNIT/ML injection Inject 25 Units into the skin at bedtime.      . meloxicam (MOBIC) 15 MG tablet Take 15 mg by mouth daily.      . metFORMIN (GLUCOPHAGE) 1000 MG tablet Take 1,000 mg by mouth 2 (two) times daily with a meal.      . metoprolol succinate (TOPROL-XL) 25 MG 24 hr tablet Take 25 mg by mouth daily.      Marland Kitchen PARoxetine (PAXIL) 20 MG tablet Take 20 mg by mouth every morning.      . pravastatin (PRAVACHOL) 40 MG tablet Take 40 mg by mouth every evening.         Substance Abuse History  in the last 12 months: Substance Age of 1st Use Last Use Amount Specific Type  Nicotine Denies use     Alcohol Denies use     Cannabis Denies use     Opiates Denies use     Cocaine 41 Prior to hosp 5x in a week Cocaine  Methamphetamines Denies use     LSD Denies use     Ecstasy Denies use     Benzodiazepines Denies use     Caffeine      Inhalants      Others:                         Consequences of Substance Abuse: Medical Consequences:  Liver damage Legal Consequences:  Arrests, jail time Family Consequences:  Family discord  Social History: Current Place of Residence: [place]    Place of Birth: [place], [place]  Family Members: "I have 2 grown children"  Marital Status:  Divorced  Children: 2  Sons: 1  Daughters: 1  Relationships: "I'm divorced"  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  Legal History: None reported  Hobbies/Interests: None reported  Family History:   Family History  Problem Relation Age of Onset  . Diabetes Mother   . Diabetes Father   . Diabetes Brother     Mental Status Examination/Evaluation: Objective:  Appearance: Casual  Eye Contact::  Good  Speech:  Clear and Coherent  Volume:  Normal  Mood:  Depressed and Hopeless  Affect:  Flat  Thought Process:  Coherent and Intact  Orientation:  Full  Thought Content:  Rumination  Suicidal Thoughts:  No  Homicidal Thoughts:  No  Memory:  Immediate;   Good Recent;   Good Remote;   Good  Judgement:  Poor  Insight:  Fair  Psychomotor Activity:  Normal  Concentration:  Fair  Recall:  Good  Akathisia:  No  Handed:  Right  AIMS (if indicated):     Assets:  Communication Skills Desire for Improvement  Sleep:       Laboratory/X-Ray Psychological Evaluation(s)      Assessment:     AXIS I:  Cocaine abuse, Major depressive disorder, recurrent epiosdes. AXIS II:  Deferred AXIS III:   Past Medical History  Diagnosis Date  . Diabetes mellitus   . Hypertension   . Angina   . Shortness of breath   . Headache   . Gout   . Hyperlipidemia   . HIV infection   . Crack cocaine use    AXIS IV:  economic problems, housing problems, occupational problems, other  psychosocial or environmental problems and Chronic health issues. AXIS V:  11-20 some danger of hurting self or others possible OR occasionally fails to maintain minimal personal hygiene OR gross impairment in communication  Treatment Plan/Recommendations: Admit for safety and stabilization. Review and re-instate any pertinent home medications for other medical issues. Initiate Glycemic control. Obtain hemoglobin A1C. Diabetes consults. Group counseling sessions.  Treatment Plan Summary: Daily contact with patient to assess and evaluate symptoms and progress in treatment Medication management  Current Medications:  Current Facility-Administered Medications  Medication Dose Route Frequency Provider Last Rate Last Dose  . acetaminophen (TYLENOL) tablet 650 mg  650 mg Oral Q6H PRN Maryruth Eve, MD      . allopurinol (ZYLOPRIM) tablet 200 mg  200 mg Oral q morning - 10a Encarnacion Slates, NP      . alum & mag hydroxide-simeth (MAALOX/MYLANTA) 200-200-20 MG/5ML suspension 30 mL  30 mL Oral Q4H PRN Maryruth Eve, MD      . aspirin chewable tablet 81 mg  81 mg Oral Daily Encarnacion Slates, NP      . diclofenac sodium (VOLTAREN) 1 % transdermal gel 1 application  1 application Topical QID Encarnacion Slates, NP      . diltiazem (CARDIZEM CD) 24 hr capsule 240 mg  240 mg Oral Daily Encarnacion Slates, NP      . DULoxetine (CYMBALTA) DR capsule 60 mg  60 mg Oral Daily Encarnacion Slates, NP      . Emtricitab-Rilpivir-Tenofovir 200-25-300 MG TABS 1 tablet  1 tablet Oral Daily Encarnacion Slates, NP      . etodolac (LODINE) tablet 500 mg  500  mg Oral BID Encarnacion Slates, NP      . gabapentin (NEURONTIN) capsule 300 mg  300 mg Oral TID Encarnacion Slates, NP      . hydrOXYzine (ATARAX/VISTARIL) tablet 50 mg  50 mg Oral QHS Encarnacion Slates, NP      . insulin aspart (novoLOG) injection 0-15 Units  0-15 Units Subcutaneous TID WC Encarnacion Slates, NP      . insulin aspart (novoLOG) injection 4 Units  4 Units Subcutaneous TID WC Encarnacion Slates, NP      . insulin glargine (LANTUS) injection 25 Units  25 Units Subcutaneous QHS Encarnacion Slates, NP      . magnesium hydroxide (MILK OF MAGNESIA) suspension 30 mL  30 mL Oral Daily PRN Maryruth Eve, MD      . meloxicam (MOBIC) tablet 15 mg  15 mg Oral Daily Encarnacion Slates, NP      . metFORMIN (GLUCOPHAGE) tablet 1,000 mg  1,000 mg Oral BID WC Encarnacion Slates, NP      . metoprolol succinate (TOPROL-XL) 24 hr tablet 25 mg  25 mg Oral Daily Encarnacion Slates, NP      . PARoxetine (PAXIL) tablet 20 mg  20 mg Oral Vernice [name], NP      . simvastatin (ZOCOR) tablet 20 mg  20 mg Oral q1800 Encarnacion Slates, NP       Facility-Administered Medications Ordered in Other Encounters  Medication Dose Route Frequency Provider Last Rate Last Dose  . DISCONTD: acetaminophen (TYLENOL) tablet 650 mg  650 mg Oral Q4H PRN Domenic Moras, PA-C      . DISCONTD: allopurinol (ZYLOPRIM) tablet 200 mg  200 mg Oral Daily Domenic Moras, PA-C   200 mg at 05/12/12 1022  . DISCONTD: aspirin chewable tablet 81 mg  81 mg Oral Daily Domenic Moras, PA-C   81 mg at 05/12/12 1022  . DISCONTD: diclofenac sodium (VOLTAREN) 1 % transdermal gel 1 application  1 application Topical QID Domenic Moras, PA-C   1 application at [id number]  . DISCONTD: diltiazem (CARDIZEM CD) 24 hr capsule 240 mg  240 mg Oral Daily Domenic Moras, PA-C   240 mg at 05/12/12 1022  . DISCONTD: DULoxetine (CYMBALTA) DR capsule 60 mg  60 mg Oral Daily Domenic Moras, PA-C   60 mg at 05/12/12 1022  . DISCONTD: emtricitabine-tenofovir (TRUVADA) 200-300 MG per tablet 1 tablet  1 tablet Oral Daily  Mervin Kung, MD   1 tablet at 05/12/12 1022  . DISCONTD: etodolac (LODINE) capsule 500 mg  500 mg Oral BID Mervin Kung, MD   500 mg at 05/12/12 2159  . DISCONTD: gabapentin (NEURONTIN) capsule 300 mg  300 mg Oral TID Domenic Moras, PA-C   300 mg at 05/12/12 2159  . DISCONTD: hydrOXYzine (ATARAX/VISTARIL) tablet 50 mg  50 mg Oral QHS Domenic Moras, PA-C   50 mg at 05/12/12 2159  . DISCONTD: insulin glargine (LANTUS) injection 25 Units  25 Units Subcutaneous QHS Domenic Moras, PA-C   25 Units at 05/12/12 2159  . DISCONTD: LORazepam (ATIVAN) tablet 1 mg  1 mg Oral Q8H PRN Domenic Moras, PA-C   1 mg at 05/11/12 1757  . DISCONTD: metFORMIN (GLUCOPHAGE) tablet 1,000 mg  1,000 mg Oral BID WC Domenic Moras, PA-C   1,000 mg at 05/12/12 1814  . DISCONTD: nicotine (NICODERM CQ - dosed in mg/24 hours) patch 21 mg  21 mg Transdermal Daily Domenic Moras, PA-C      . DISCONTD: PARoxetine (PAXIL) tablet 20 mg  20 mg Oral Daily Domenic Moras, PA-C   20 mg at 05/12/12 1032  . DISCONTD: rilpivirine (ENDURANT) tablet 25 mg  25 mg Oral Daily Mervin Kung, MD   25 mg at 05/12/12 1022  . DISCONTD: simvastatin (ZOCOR) tablet 5 mg  5 mg Oral q1800 Domenic Moras, PA-C   5 mg at 05/12/12 1814    Observation Level/Precautions:  Q 15 minute checks for safety  Laboratory:  Obtain Hemoglobin A1C  Psychotherapy:  Group sessions,   Medications:  See medication lists  Routine PRN Medications:  Yes  Consultations: Diabetic consults.    Discharge Concerns: Safety and maintaining sobriety.  Other:     Lindell Spar I 8/7/201312:55 PM

## 2012-05-13 NOTE — Progress Notes (Signed)
Hypoglycemic Event  CBG: 60  Treatment: 15 GM carbohydrate snack  Symptoms: Dizziness  Follow-up CBG: Time:1748 CBG Result:82  Possible Reasons for Event: Medication regimen: Insulin Coverage  Comments/MD notified:Neil Masburn, PA -- Hypoglycemia Protocol Ordered    Marilynne Halsted M  Remember to initiate Hypoglycemia Order Set & complete

## 2012-05-13 NOTE — Progress Notes (Signed)
[facility] MD Progress Note  05/13/2012 5:57 PM  Diagnosis:   Axis I: Major Depression, Recurrent severe, Substance Induced Mood Disorder and Mood disorder due to general medical condition, Cocaine Dependence Axis II: Deferred Axis III:  Past Medical History  Diagnosis Date  . Diabetes mellitus   . Hypertension   . Angina   . Shortness of breath   . Headache   . Gout   . Hyperlipidemia   . HIV infection   . Crack cocaine use    Axis IV: other psychosocial or environmental problems Axis V: 21-30 behavior considerably influenced by delusions or hallucinations OR serious impairment in judgment, communication OR inability to function in almost all areas  ADL's:  Impaired  Sleep: Poor  Appetite:  Poor  Suicidal Ideation:  Plan:  No Intent:  No Means:  No Homicidal Ideation:  Plan:  No Intent:  No Means:  no  AEB (as evidenced by): pt report now  Mental Status Examination/Evaluation: Objective:  Appearance: Casual  Eye Contact::  Good  Speech:  Clear and Coherent  Volume:  Normal  Mood:  Anxious, Depressed, Hopeless, Irritable and Worthless  Affect:  Congruent  Thought Process:  Coherent  Orientation:  Full  Thought Content:  WDL  Suicidal Thoughts:  Yes.  without intent/plan, prior to admission.  Homicidal Thoughts:  No  Memory:  Immediate;   Fair Recent;   Fair Remote;   Fair  Judgement:  Impaired  Insight:  Lacking  Psychomotor Activity:  Normal  Concentration:  Fair  Recall:  Fair  Akathisia:  No  AIMS (if indicated):     Assets:  Communication Skills Desire for Improvement  Sleep:      Vital Signs:Blood pressure 123/78, pulse 99, temperature 98.1 F (36.7 C), temperature source Oral, resp. rate 17, height 5\' 6"  (1.676 m), weight 71.668 kg (158 lb). Current Medications: Current Facility-Administered Medications  Medication Dose Route Frequency Provider Last Rate Last Dose  . acetaminophen (TYLENOL) tablet 650 mg  650 mg Oral Q6H PRN Maryruth Eve, MD        . allopurinol (ZYLOPRIM) tablet 200 mg  200 mg Oral q morning - 10a Encarnacion Slates, NP   200 mg at 05/13/12 1418  . alum & mag hydroxide-simeth (MAALOX/MYLANTA) 200-200-20 MG/5ML suspension 30 mL  30 mL Oral Q4H PRN Maryruth Eve, MD      . aspirin chewable tablet 81 mg  81 mg Oral Daily Encarnacion Slates, NP   81 mg at 05/13/12 1420  . diclofenac sodium (VOLTAREN) 1 % transdermal gel 1 application  1 application Topical QID Encarnacion Slates, NP   1 application at [id number]  . diltiazem (CARDIZEM CD) 24 hr capsule 240 mg  240 mg Oral Daily Encarnacion Slates, NP   240 mg at 05/13/12 1420  . DULoxetine (CYMBALTA) DR capsule 60 mg  60 mg Oral Daily Encarnacion Slates, NP   60 mg at 05/13/12 1420  . Emtricitab-Rilpivir-Tenofovir 200-25-300 MG TABS 1 tablet  1 tablet Oral Daily Encarnacion Slates, NP   1 tablet at 05/13/12 1422  . feeding supplement (GLUCERNA SHAKE) liquid 237 mL  237 mL Oral QHS Christie Beckers, RD      . gabapentin (NEURONTIN) capsule 300 mg  300 mg Oral TID Encarnacion Slates, NP   300 mg at 05/13/12 1420  . hydrOXYzine (ATARAX/VISTARIL) tablet 50 mg  50 mg Oral QHS Encarnacion Slates, NP      . insulin aspart (novoLOG)  injection 0-15 Units  0-15 Units Subcutaneous TID WC Encarnacion Slates, NP   11 Units at 05/13/12 1421  . insulin aspart (novoLOG) injection 4 Units  4 Units Subcutaneous TID WC Encarnacion Slates, NP   4 Units at 05/13/12 1423  . insulin glargine (LANTUS) injection 25 Units  25 Units Subcutaneous QHS Encarnacion Slates, NP      . magnesium hydroxide (MILK OF MAGNESIA) suspension 30 mL  30 mL Oral Daily PRN Maryruth Eve, MD      . meloxicam (MOBIC) tablet 15 mg  15 mg Oral Daily Encarnacion Slates, NP   15 mg at 05/13/12 1419  . metFORMIN (GLUCOPHAGE) tablet 1,000 mg  1,000 mg Oral BID WC Encarnacion Slates, NP   1,000 mg at 05/13/12 1705  . metoprolol succinate (TOPROL-XL) 24 hr tablet 25 mg  25 mg Oral Daily Encarnacion Slates, NP   25 mg at 05/13/12 1419  . PARoxetine (PAXIL) tablet 20 mg  20 mg Oral Vernice [name], NP      . simvastatin (ZOCOR) tablet 20 mg  20 mg Oral q1800 Encarnacion Slates, NP      . DISCONTD: etodolac (LODINE) capsule 500 mg  500 mg Oral BID Alyson Kuroski-Mazzei, DO      . DISCONTD: etodolac (LODINE) tablet 500 mg  500 mg Oral BID Encarnacion Slates, NP       Facility-Administered Medications Ordered in Other Encounters  Medication Dose Route Frequency Provider Last Rate Last Dose  . DISCONTD: acetaminophen (TYLENOL) tablet 650 mg  650 mg Oral Q4H PRN Domenic Moras, PA-C      . DISCONTD: allopurinol (ZYLOPRIM) tablet 200 mg  200 mg Oral Daily Domenic Moras, PA-C   200 mg at 05/12/12 1022  . DISCONTD: aspirin chewable tablet 81 mg  81 mg Oral Daily Domenic Moras, PA-C   81 mg at 05/12/12 1022  . DISCONTD: diclofenac sodium (VOLTAREN) 1 % transdermal gel 1 application  1 application Topical QID Domenic Moras, PA-C   1 application at [id number]  . DISCONTD: diltiazem (CARDIZEM CD) 24 hr capsule 240 mg  240 mg Oral Daily Domenic Moras, PA-C   240 mg at 05/12/12 1022  . DISCONTD: DULoxetine (CYMBALTA) DR capsule 60 mg  60 mg Oral Daily Domenic Moras, PA-C   60 mg at 05/12/12 1022  . DISCONTD: emtricitabine-tenofovir (TRUVADA) 200-300 MG per tablet 1 tablet  1 tablet Oral Daily Mervin Kung, MD   1 tablet at 05/12/12 1022  . DISCONTD: etodolac (LODINE) capsule 500 mg  500 mg Oral BID Mervin Kung, MD   500 mg at 05/12/12 2159  . DISCONTD: gabapentin (NEURONTIN) capsule 300 mg  300 mg Oral TID Domenic Moras, PA-C   300 mg at 05/12/12 2159  . DISCONTD: hydrOXYzine (ATARAX/VISTARIL) tablet 50 mg  50 mg Oral QHS Domenic Moras, PA-C   50 mg at 05/12/12 2159  . DISCONTD: insulin glargine (LANTUS) injection 25 Units  25 Units Subcutaneous QHS Domenic Moras, PA-C   25 Units at 05/12/12 2159  . DISCONTD: LORazepam (ATIVAN) tablet 1 mg  1 mg Oral Q8H PRN Domenic Moras, PA-C   1 mg at 05/11/12 1757  . DISCONTD: metFORMIN (GLUCOPHAGE) tablet 1,000 mg  1,000 mg Oral BID WC Domenic Moras, PA-C   1,000 mg at 05/12/12 1814  .  DISCONTD: nicotine (NICODERM CQ - dosed in mg/24 hours) patch 21 mg  21 mg Transdermal Daily Domenic Moras, PA-C      .  DISCONTD: PARoxetine (PAXIL) tablet 20 mg  20 mg Oral Daily Domenic Moras, PA-C   20 mg at 05/12/12 1032  . DISCONTD: rilpivirine (ENDURANT) tablet 25 mg  25 mg Oral Daily Mervin Kung, MD   25 mg at 05/12/12 1022  . DISCONTD: simvastatin (ZOCOR) tablet 5 mg  5 mg Oral q1800 Domenic Moras, PA-C   5 mg at 05/12/12 1814    Lab Results:  Results for orders placed during the hospital encounter of 05/13/12 (from the past 48 hour(s))  GLUCOSE, CAPILLARY     Status: Abnormal   Collection Time   05/13/12  1:52 PM      Component Value Range Comment   Glucose-Capillary 303 (*) 70 - 99 mg/dL   GLUCOSE, CAPILLARY     Status: Abnormal   Collection Time   05/13/12  5:09 PM      Component Value Range Comment   Glucose-Capillary 60 (*) 70 - 99 mg/dL   GLUCOSE, CAPILLARY     Status: Abnormal   Collection Time   05/13/12  5:31 PM      Component Value Range Comment   Glucose-Capillary 53 (*) 70 - 99 mg/dL    Comment 1 Notify RN     GLUCOSE, CAPILLARY     Status: Normal   Collection Time   05/13/12  5:48 PM      Component Value Range Comment   Glucose-Capillary 82  70 - 99 mg/dL     Physical Findings: AIMS: Facial and Oral Movements Muscles of Facial Expression: None, normal Lips and Perioral Area: None, normal Jaw: None, normal Tongue: None, normal,Extremity Movements Upper (arms, wrists, hands, fingers): None, normal Lower (legs, knees, ankles, toes): None, normal, Trunk Movements Neck, shoulders, hips: None, normal, Overall Severity Severity of abnormal movements (highest score from questions above): None, normal Incapacitation due to abnormal movements: None, normal Patient's awareness of abnormal movements (rate only patient's report): No Awareness, Dental Status Current problems with teeth and/or dentures?: Yes (few missing teeth and poor hygiene) Does patient usually wear  dentures?: No  CIWA:  CIWA-Ar Total: 0  COWS:  COWS Total Score: 2   Treatment Plan Summary: Daily contact with patient to assess and evaluate symptoms and progress in treatment Medication management No signs/symptoms of withdrawal from abusable substances. No suicidal or homicidal thoughts for at least 48 hours. Mood/anxiety less than 3/10 where the scale is 1 is the best and 10 is the worst  Plan: Admit, Continue non narcotic meds for fibromyalgia, add Elavil for that.  Discussed the risks, benefits, and probable clinical course with and without treatment.  Pt is agreeable to the current course of treatment.  Westlynn Fifer 05/13/2012, 5:57 PM

## 2012-05-13 NOTE — Progress Notes (Signed)
[facility] Group Notes:  (Counselor/Nursing/MHT/Case Management/Adjunct)  05/13/2012 4:08 PM  Type of Therapy:  Group Therapy at 1:15 to 2:30  Participation Level:  Minimal  Participation Quality:  Attentive and Sharing  Affect:  Depressed and Flat  Cognitive:  Oriented  Insight:  Limited  Engagement in Group:  Limited, called out bu nursing staff  Engagement in Therapy:  Limited  Modes of Intervention:  Clarification, Orientation, Socialization and Support  Summary of Progress/Problems: The focus of this group session was to process how we deal with difficult emotions and share with others the patterns that play out when we are reacting to the emotion verses the situation. Corbin  shared that one of the most difficult emotions he deals with is frustration and anger.  The Landlord promised me they were going to clean up the drug traffic around my home but nothing has changed since I left treatment and returned. Patient was able to process the 'anger and frustration' down to 'fear' as he is powerless over using something so easily available. Patient was offered encouragement which he seemed uncertain of. Called out by medical staff.     Lyla Glassing 05/13/2012, 4:08 PM

## 2012-05-13 NOTE — Progress Notes (Signed)
D: Patient in his room during this assessment. He reported that his day had been ;"prety rough". Stated that he had pain on his leg and shoulder, but it has been well managed by staff. He denied voices, denied SI/HI . Patient stated that he only hallucinates when going through withdrawals. "only happens when I have withdrawals". His mood and affect sad and depressed. Although he seemed very cooperative, soft spoken and pleasant.  A: Pt encouraged and supported. Writer to verify patient pain medication and give and ordered. R: Pt receptive to encouragement. Q 15 minute check continues to maintain safety.

## 2012-05-13 NOTE — H&P (Signed)
Medical/psychiatric screening examination/treatment/procedure(s) were performed by non-physician practitioner and as supervising physician I was immediately available for consultation/collaboration.  I have seen and examined this patient and agree the major elements of this evaluation.  

## 2012-05-14 ENCOUNTER — Ambulatory Visit: Payer: Self-pay | Admitting: Family Medicine

## 2012-05-14 LAB — GLUCOSE, CAPILLARY
Glucose-Capillary: 104 mg/dL — ABNORMAL HIGH (ref 70–99)
Glucose-Capillary: 157 mg/dL — ABNORMAL HIGH (ref 70–99)

## 2012-05-14 LAB — HEMOGLOBIN A1C
Hgb A1c MFr Bld: 9 % — ABNORMAL HIGH (ref <5.7)
Mean Plasma Glucose: 212 mg/dL — ABNORMAL HIGH (ref <117)

## 2012-05-14 MED ORDER — TRAZODONE HCL 50 MG PO TABS
50.0000 mg | ORAL_TABLET | Freq: Every day | ORAL | Status: DC
  Administered 2012-05-14 – 2012-05-17 (×4): 50 mg via ORAL
  Filled 2012-05-14 (×7): qty 1

## 2012-05-14 NOTE — Progress Notes (Signed)
[facility] Group Notes:  (Counselor/Nursing/MHT/Case Management/Adjunct)  05/14/2012 4:38 PM  Type of Therapy:  Group Therapy at 1:15 to 2:30  Participation Level:  Minimal  Participation Quality:  Appropriate and Drowsy  Affect:  Flat  Cognitive:  Oriented  Insight:  Limited  Engagement in Group:  Limited  Engagement in Therapy:  Limited  Modes of Intervention:  Activity, Clarification, Socialization and Support  Summary of Progress/Problems: Michael [name] participated in activity in which patients choose photographs to represent what their life would look and feel like were it in balance and another for out of balance. He choose a photo of a baby parrot sitting in a human hand to represent balance and support.  He then shared a photo of someone walking in the dark of night surrounded only by the light from a lantern to represent out of balance. This led to group discussion on being in the moment, simply taking the next right step.    Lyla Glassing 05/14/2012 4:42 PM

## 2012-05-14 NOTE — Discharge Planning (Signed)
Pt was seen this morning during discharge planning group.  Pt reported that he admitted to the hospital due to a recent relapse on "crack cocaine."  Pt stated that he has had prior treatment at both [facility] and both were a great help to him.  Once he left he treatment and returned home to a drug induced living environment he relapsed.  He reported that he does not want to return home back to the same place.  He prefers long term substance abuse treatment.  He does not have adequate transportation.  He rated his depression level at a nine and his anxiety level at a eight.  Pt denies SI/HI/AVH.

## 2012-05-14 NOTE — Progress Notes (Signed)
[facility] MD Progress Note   05/14/2012 12:15 PM  S/O: Patient seen and evaluated. Chart reviewed. Patient stated that his mood was "not good". His affect was mood congruent and depressed. He denied any current thoughts of self injurious behavior, suicidal ideation or homicidal ideation. There were no auditory or visual hallucinations, paranoia, delusional thought processes, or mania noted.  Thought process was linear and goal directed.  No psychomotor agitation or retardation was noted. His speech was normal rate, tone and volume. Eye contact was good. Judgment and insight are fair.  Patient has been up and engaged on the unit.  No acute safety concerns reported from team.  Sleep:  Number of Hours: 6.75    Vital Signs:Blood pressure 113/70, pulse 83, temperature 97.8 F (36.6 C), temperature source Oral, resp. rate 18, height 5\' 6"  (1.676 m), weight 71.668 kg (158 lb).  Current Medications:    . allopurinol  200 mg Oral q morning - 10a  . amitriptyline  25 mg Oral QHS  . aspirin  81 mg Oral Daily  . diclofenac sodium  1 application Topical QID  . diltiazem  240 mg Oral Daily  . DULoxetine  60 mg Oral Daily  . Emtricitab-Rilpivir-Tenofovir  1 tablet Oral Daily  . feeding supplement  237 mL Oral QHS  . gabapentin  300 mg Oral TID  . hydrOXYzine  50 mg Oral QHS  . insulin aspart  0-15 Units Subcutaneous TID WC  . insulin aspart  4 Units Subcutaneous TID WC  . insulin glargine  25 Units Subcutaneous QHS  . meloxicam  15 mg Oral Daily  . metFORMIN  1,000 mg Oral BID WC  . metoprolol succinate  25 mg Oral Daily  . PARoxetine  20 mg Oral BH-q7a  . simvastatin  20 mg Oral q1800  . DISCONTD: etodolac  500 mg Oral BID  . DISCONTD: etodolac  500 mg Oral BID    Lab Results:  Results for orders placed during the hospital encounter of 05/13/12 (from the past 48 hour(s))  GLUCOSE, CAPILLARY     Status: Abnormal   Collection Time   05/13/12  1:52 PM      Component Value Range Comment   Glucose-Capillary 303 (*) 70 - 99 mg/dL   GLUCOSE, CAPILLARY     Status: Abnormal   Collection Time   05/13/12  5:09 PM      Component Value Range Comment   Glucose-Capillary 60 (*) 70 - 99 mg/dL   GLUCOSE, CAPILLARY     Status: Abnormal   Collection Time   05/13/12  5:31 PM      Component Value Range Comment   Glucose-Capillary 53 (*) 70 - 99 mg/dL    Comment 1 Notify RN     GLUCOSE, CAPILLARY     Status: Normal   Collection Time   05/13/12  5:48 PM      Component Value Range Comment   Glucose-Capillary 82  70 - 99 mg/dL   BASIC METABOLIC PANEL     Status: Abnormal   Collection Time   05/13/12  7:37 PM      Component Value Range Comment   Sodium 142  135 - 145 mEq/L    Potassium 3.7  3.5 - 5.1 mEq/L    Chloride 104  96 - 112 mEq/L    CO2 25  19 - 32 mEq/L    Glucose, Bld 107 (*) 70 - 99 mg/dL    BUN 14  6 - 23 mg/dL  Creatinine, Ser 1.04  0.50 - 1.35 mg/dL    Calcium 9.3  8.4 - 10.5 mg/dL    GFR calc non Af Amer 75 (*) >90 mL/min    GFR calc Af Amer 87 (*) >90 mL/min   HEMOGLOBIN A1C     Status: Abnormal   Collection Time   05/13/12  7:45 PM      Component Value Range Comment   Hemoglobin A1C 9.0 (*) <5.7 %    Mean Plasma Glucose 212 (*) <117 mg/dL   GLUCOSE, CAPILLARY     Status: Abnormal   Collection Time   05/13/12  9:07 PM      Component Value Range Comment   Glucose-Capillary 114 (*) 70 - 99 mg/dL   GLUCOSE, CAPILLARY     Status: Abnormal   Collection Time   05/14/12  6:38 AM      Component Value Range Comment   Glucose-Capillary 165 (*) 70 - 99 mg/dL   GLUCOSE, CAPILLARY     Status: Abnormal   Collection Time   05/14/12 12:02 PM      Component Value Range Comment   Glucose-Capillary 104 (*) 70 - 99 mg/dL     Physical Findings: COWS:  COWS Total Score: 2   Diagnoses:  AXIS I: Major Depression, Recurrent; Cocaine Dependence  AXIS II: Deferred  AXIS III:  Past Medical History   Diagnosis  Date   .  Diabetes mellitus    .  Hypertension    .  Angina    .   Shortness of breath    .  Headache    .  Gout    .  Hyperlipidemia    .  HIV infection    .  Crack cocaine use    AXIS IV: Moderate  AXIS V: 50  Plan: Pt seen and evaluated in treatment team.  Reviewed short term and long term goals, medications, current treatment in the hospital and acute/chronic safety.  Pt denied any current thoughts of self harm, suicidal ideation or homicidal ideation.  Contracted for safety on the unit.  No acute issues noted.  VS reviewed with team.  Pt agreeable with treatment plan, see orders. Will d/c Elavil and Vistaril for sleep and initiate Trazodone.  Also, discontinued Paxil as pt wants to decrease polypharmacy.  Did well upon last admission with Cymbalta and sobriety.  Will also see diabetic nurse today and is considering residential Tx.        Cheryll Cockayne 05/14/2012, 12:15 PM

## 2012-05-14 NOTE — Progress Notes (Signed)
Adult Psychosocial Assessment Update Interdisciplinary Team  Previous [facility] admissions/discharges:  Admissions Discharges  Date:01/09/2012  Date: 01/15/2012  Date: Date:  Date: Date:  Date: Date:  Date: Date:   Changes since the last Psychosocial Assessment (including adherence to outpatient mental health and/or substance abuse treatment, situational issues contributing to decompensation and/or relapse).   Patient reports he followed up at 28 day program at [facility] upon January 15 2012 discharge    From [facility].  He then stayed clean until June 31, 2013.  Patient reports living [place] has been huge stressor as community is heavily affected by drugs.  Patient    believes [facility] where he is on waiting list will be a much better    environment for him. Brother and sister have reportedly moved belongings from current   apartment to storage.  Patient's last binge included "5 day run of cocaine."   Discharge Plan 1. Will you be returning to the same living situation after discharge?   Yes: No: X     If no, what is your plan?      Patient wants clean living environment until he is able to move into [facility] where he is on wait list     2. Would you like a referral for services when you are discharged? Yes:  X   If yes, for what services?  No:         Patient believes he needs to return to treatment; recently completed [facility] program   in April of this year.     Summary and Recommendations (to be completed by the evaluator)  Patient is  61 YO African American male admitted with diagnosis of Cocaine    Dependence and Major Depression, Recurrent, Severe.  Patient reports he has been   sticking his the palms of hands with scissors and suicide plan to walk into traffic or cut   his wrists. Patient will benefit from crisis stabilization, medication evaluation, group     therapy and psycho ed groups, in addition to case management for  discharge planning.               Signature:  Lyla Glassing, 05/14/2012 9:26 AM

## 2012-05-14 NOTE — Progress Notes (Signed)
Psychoeducational Group Note  Date:  05/14/2012 Time:  1100  Group Topic/Focus:  Overcoming Stress:   The focus of this group is to define stress and help patients assess their triggers.  Participation Level:  Active  Participation Quality:  Appropriate, Attentive and Sharing  Affect:  Appropriate  Cognitive:  Appropriate  Insight:  Good  Engagement in Group:  Limited  Additional Comments:  Pt was very limited in his interactions while attending group. Pt appeared very tired but was willing to share that being under pressure was a major stress for him. Pt stated that a lot of pressure causes him to shut down and become depressed.   Elenor Legato 05/14/2012, 1:59 PM

## 2012-05-14 NOTE — Progress Notes (Signed)
Physical Therapy consulted with patient this afternoon.  PT stated patient needs a straight cane which was left in patient's room.

## 2012-05-14 NOTE — Progress Notes (Signed)
D: Patient reports that he is have some suicidal thoughts but denies HI and A/V hallucinations; patient reported that his sleep was fair; appetite is improving and energy level is low; ability to pay attention is low; rated her depression as 9/10 and hopelessness as 9/10;has some complaints of dizziness and lightheadedness A: patient has been offered emotional support; and encouraged to express feelings and/or concerns;medication administration/physician orders; safety contract established to talk to staff when having feelings of SI encouraged to attend groups;  R: Patient is cooperative and pleasant; patient is attending groups; patient is taking medications as prescribed and tolerating medications; contract for safety established with patient was set and patient agreed

## 2012-05-14 NOTE — Progress Notes (Signed)
05/14/2012         Time: 1500      Group Topic/Focus: The focus of this group is on enhancing the patient's understanding of leisure, barriers to leisure, and the importance of engaging in positive leisure activities upon discharge for improved total health.  Participation Level: Minimal  Participation Quality: Drowsy  Affect: Blunted  Cognitive: Oriented   Additional Comments: Patient reports feeling "doped up," was drowsy for much of group.    Michael [name] 05/14/2012 3:51 PM

## 2012-05-14 NOTE — Tx Team (Signed)
Interdisciplinary Treatment Plan Update (Adult)  Date:  05/14/2012  Time Reviewed:  10:38 AM   Progress in Treatment: Attending groups: Yes Participating in groups:  Yes Taking medication as prescribed: Yes Tolerating medication:  Yes Family/Significant othe contact made:  Counselor assessing for appropriate contact Patient understands diagnosis:  Yes Discussing patient identified problems/goals with staff:  Yes Medical problems stabilized or resolved:  Yes Denies suicidal/homicidal ideation: Yes Issues/concerns per patient self-inventory:  None identified Other: N/A  New problem(s) identified: None Identified  Reason for Continuation of Hospitalization: Anxiety Depression Medication stabilization Withdrawal symptoms  Interventions implemented related to continuation of hospitalization: mood stabilization, medication monitoring and adjustment, group therapy and psycho education, safety checks q 15 mins  Additional comments: N/A  Estimated length of stay: 3-5 days  Discharge Plan: SW will assess for appropriate referrals.    New goal(s): N/A  Review of initial/current patient goals per problem list:    1.  Goal(s): Address substance use  Met:  No  Target date: by discharge  As evidenced by: completing detox protocol and refer to appropriate treatment  2.  Goal (s): Reduce depressive and anxiety symptoms  Met:  No  Target date: by discharge  As evidenced by: Reducing depression from a 10 to a 3 as reported by pt.    3.  Goal(s): Eliminate SI  Met:  No  Target date: by discharge  As evidenced by: Pt denying SI.     Attendees: Patient:  Michael [name] 05/14/2012 10:38 AM   Family:     Physician:  Cheryll Cockayne, DO 05/14/2012 10:38 AM   Nursing: Grayland Ormond, RN 05/14/2012 10:38 AM   Case Manager:  Regan Lemming, [place] 05/14/2012  10:38 AM   Counselor:  Frutoso Chase, [place] 05/14/2012  10:38 AM   Other:  Clinton Sawyer, RN 05/14/2012 10:38 AM   Other:   Luz Brazen, RN 05/14/2012 10:39 AM   Other:     Other:      Scribe for Treatment Team:   Regan Lemming 05/14/2012 10:38 AM

## 2012-05-14 NOTE — Evaluation (Signed)
Physical Therapy Evaluation Patient Details Name: Michael [name] MRN: [id number] DOB: June 11, 1951 Today's Date: 05/14/2012 Time: [id number] PT Time Calculation (min): 17 min  PT Assessment / Plan / Recommendation Clinical Impression  Pt.  admitted to [facility] for substance abuse. Pt. relates injury to L knee and has seen an orthotist. pt. did demonstrate steadier gait w/ cane/ RN notified of need for single point cane.. sticky note to MD in chart requesting a cane be ordered. Pt. instructed on height of cane when he gets one.    PT Assessment  Patent does not need any further PT services    Follow Up Recommendations  No PT follow up    Barriers to Discharge        Equipment Recommendations  Cane    Recommendations for Other Services     Frequency      Precautions / Restrictions Precautions Precautions: Fall   Pertinent Vitals/Pain 8/10 l knee      Mobility  Bed Mobility Bed Mobility: Supine to Sit;Sit to Supine Supine to Sit: 7: Independent Sit to Supine: 7: Independent Transfers Transfers: Sit to Stand;Stand to Sit Sit to Stand: 7: Independent Stand to Sit: 7: Independent Details for Transfer Assistance: noted slow to rise, had to try x 2 to get up. Ambulation/Gait Ambulation/Gait Assistance: 6: Modified independent (Device/Increase time);Other (comment) (holds to rail and bed and walls.) Ambulation Distance (Feet): 200 Feet Assistive device: Straight cane Ambulation/Gait Assistance Details: Once given cane, gait is steadier and decreased antalgia on L. pt instructed in using cane on R side. Gait Pattern: Step-through pattern;Decreased step length - right;Decreased stance time - left;Antalgic Gait velocity: decreased    Exercises Other Exercises Other Exercises: quad sets instruction to perform 10 reps multiple times each day.   PT Diagnosis:    PT Problem List:   PT Treatment Interventions:     PT Goals    Visit Information  Last PT Received On:  05/14/12 Assistance Needed: +1    Subjective Data  Subjective: My knee gives out on me. the ortho doctor said something was torn. I got a shot that helped Patient Stated Goal: to walk with no pain   Prior Functioning  Home Living Lives With: Alone Available Help at Discharge:  (substance abuse facility) Type of Home: Apartment Home Access: Level entry Home Layout: One level Home Adaptive Equipment: None Prior Function Level of Independence: Independent Communication Communication: No difficulties    Cognition  Overall Cognitive Status: Appears within functional limits for tasks assessed/performed Arousal/Alertness: Awake/alert Orientation Level: Appears intact for tasks assessed    Extremity/Trunk Assessment Right Upper Extremity Assessment RUE ROM/Strength/Tone: [facility] for tasks assessed Left Upper Extremity Assessment LUE ROM/Strength/Tone: WFL for tasks assessed Right Lower Extremity Assessment RLE ROM/Strength/Tone: [facility] for tasks assessed Left Lower Extremity Assessment LLE ROM/Strength/Tone: Deficits LLE ROM/Strength/Tone Deficits: quad strength 4/5, gives w/ pain   Balance    End of Session PT - End of Session Activity Tolerance: Patient tolerated treatment well Patient left: in bed;with call bell/phone within reach Nurse Communication: Mobility status;Other (comment) (need for straight cane)  GP     Claretha Cooper 05/14/2012, 4:45 PM  [phone]

## 2012-05-14 NOTE — Progress Notes (Signed)
Inpatient Diabetes Program Recommendations  AACE/ADA: New Consensus Statement on Inpatient Glycemic Control (2013)  Target Ranges:  Prepandial:   less than 140 mg/dL      Peak postprandial:   less than 180 mg/dL (1-2 hours)      Critically ill patients:  140 - 180 mg/dL   Reason for Visit: Consult - Diabetes  Pt states he checks blood sugars at home and takes meter to MD for review.  "They usually run below 200." States he is on Lantus 25 units QHS and metformin 1000mg  bid.  Results for RIKI, GLADIN (MRN [id number]) as of 05/14/2012 16:42  Ref. Range 05/13/2012 19:45  Hemoglobin A1C Latest Range: <5.7 % 9.0 (H)  Results for Michael [name], Michael [name] (MRN [id number]) as of 05/14/2012 16:42  Ref. Range 05/13/2012 08:26 05/13/2012 13:52 05/13/2012 17:09 05/13/2012 17:31 05/13/2012 17:48 05/13/2012 21:07 05/14/2012 06:38 05/14/2012 12:02  Glucose-Capillary Latest Range: 70-99 mg/dL 101 (H) 303 (H) 60 (L) 53 (L) 82 114 (H) 165 (H) 104 (H)      Agree with Lantus 25 units QHS, Novolog 4 units tidwc and metformin 1000mg  bid with Novolog moderate correction.  Pt states he will followup with PCP regarding elevated HgbA1C.    Will continue to follow.

## 2012-05-14 NOTE — Progress Notes (Signed)
D: Patient in bed all evening. Sad and depressed. Did not open up as to what his stressors were. Although he denied SI/Hi and denied hallucinations, his mood and affect depressed and sad. He stated; "I'm coming right along". Denied SI/HI and denied Hallucinations. Stated that his pain is well controlled by his medications. A: Patient encouraged and supported. His HS CBG was 115. Writer offered him his nutritional supplement and HS medications as scheduled. Received Lantus 25 unit bedtime dosage on his right arm and tolerated the injection.  R: Patient tolerated his medications. Appreciative of care.

## 2012-05-15 LAB — GLUCOSE, CAPILLARY
Glucose-Capillary: 164 mg/dL — ABNORMAL HIGH (ref 70–99)
Glucose-Capillary: 149 mg/dL — ABNORMAL HIGH (ref 70–99)
Glucose-Capillary: 159 mg/dL — ABNORMAL HIGH (ref 70–99)

## 2012-05-15 MED ORDER — IBUPROFEN 800 MG PO TABS: 800.0000 mg | ORAL_TABLET | Freq: Three times a day (TID) | ORAL | Status: DC | PRN

## 2012-05-15 MED ORDER — GABAPENTIN 100 MG PO CAPS
200.0000 mg | ORAL_CAPSULE | Freq: Three times a day (TID) | ORAL | Status: DC
  Administered 2012-05-15 – 2012-05-18 (×10): 200 mg via ORAL
  Filled 2012-05-15 (×14): qty 2

## 2012-05-15 NOTE — Progress Notes (Signed)
[facility] Adult Inpatient Family/Significant Other Suicide Prevention Education  Suicide Prevention Education:  Contact Attempts: Sister, Marisue Brooklyn at [phone] has been identified by the patient as the family member/significant other with whom the patient will be residing, and identified as the person(s) who will aid the patient in the event of a mental health crisis.  With written consent from the patient, an attempt was made to provide suicide prevention education, prior to and/or following the patient's discharge.  We were unsuccessful in providing suicide prevention education yet another attempt will be made and a suicide education pamphlet will be given to the patient to share with family/significant other.  Date and time of first attempt:05/15/2012 9:55AM Date and time of second attempt:  Lyla Glassing 05/15/2012, 9:57 AM

## 2012-05-15 NOTE — Progress Notes (Signed)
D.  Pt bright and pleasant on approach, no complaints voiced.  Positive for evening AA group with appropriate participation noted. Interacting appropriately within milieu.  Compliant with scheduled medications.  No prns requested. A.  Support and encouragement offered R.  Will continue to monitor, Pt in dayroom eating snack and interacting appropriately with peers

## 2012-05-15 NOTE — Progress Notes (Signed)
[facility] MD Progress Note   05/15/2012 11:54 AM  S/O: Patient seen and evaluated. Chart reviewed. Patient stated that his mood was "tired from the medicine". His affect was mood congruent and constricted. He denied any current thoughts of self injurious behavior, suicidal ideation or homicidal ideation. There were no auditory or visual hallucinations, paranoia, delusional thought processes, or mania noted.  Thought process was linear and goal directed.  No psychomotor agitation or retardation was noted. His speech was normal rate, tone and volume. Eye contact was good. Judgment and insight are fair.  Patient has been up and engaged on the unit.  No acute safety concerns reported from team.  Pt willing to sue cane as directed form PT.  Sleep:  Number of Hours: 5.5    Vital Signs:Blood pressure 110/75, pulse 81, temperature 98.1 F (36.7 C), temperature source Oral, resp. rate 16, height 5\' 6"  (1.676 m), weight 71.668 kg (158 lb).  Current Medications:     . allopurinol  200 mg Oral q morning - 10a  . aspirin  81 mg Oral Daily  . diclofenac sodium  1 application Topical QID  . diltiazem  240 mg Oral Daily  . DULoxetine  60 mg Oral Daily  . Emtricitab-Rilpivir-Tenofovir  1 tablet Oral Daily  . feeding supplement  237 mL Oral QHS  . gabapentin  300 mg Oral TID  . insulin aspart  0-15 Units Subcutaneous TID WC  . insulin aspart  4 Units Subcutaneous TID WC  . insulin glargine  25 Units Subcutaneous QHS  . meloxicam  15 mg Oral Daily  . metFORMIN  1,000 mg Oral BID WC  . metoprolol succinate  25 mg Oral Daily  . simvastatin  20 mg Oral q1800  . traZODone  50 mg Oral QHS  . DISCONTD: amitriptyline  25 mg Oral QHS  . DISCONTD: hydrOXYzine  50 mg Oral QHS  . DISCONTD: PARoxetine  20 mg Oral BH-q7a    Lab Results:  Results for orders placed during the hospital encounter of 05/13/12 (from the past 48 hour(s))  GLUCOSE, CAPILLARY     Status: Abnormal   Collection Time   05/13/12  1:52 PM   Component Value Range Comment   Glucose-Capillary 303 (*) 70 - 99 mg/dL   GLUCOSE, CAPILLARY     Status: Abnormal   Collection Time   05/13/12  5:09 PM      Component Value Range Comment   Glucose-Capillary 60 (*) 70 - 99 mg/dL   GLUCOSE, CAPILLARY     Status: Abnormal   Collection Time   05/13/12  5:31 PM      Component Value Range Comment   Glucose-Capillary 53 (*) 70 - 99 mg/dL    Comment 1 Notify RN     GLUCOSE, CAPILLARY     Status: Normal   Collection Time   05/13/12  5:48 PM      Component Value Range Comment   Glucose-Capillary 82  70 - 99 mg/dL   BASIC METABOLIC PANEL     Status: Abnormal   Collection Time   05/13/12  7:37 PM      Component Value Range Comment   Sodium 142  135 - 145 mEq/L    Potassium 3.7  3.5 - 5.1 mEq/L    Chloride 104  96 - 112 mEq/L    CO2 25  19 - 32 mEq/L    Glucose, Bld 107 (*) 70 - 99 mg/dL    BUN 14  6 - 23  mg/dL    Creatinine, Ser 1.04  0.50 - 1.35 mg/dL    Calcium 9.3  8.4 - 10.5 mg/dL    GFR calc non Af Amer 75 (*) >90 mL/min    GFR calc Af Amer 87 (*) >90 mL/min   HEMOGLOBIN A1C     Status: Abnormal   Collection Time   05/13/12  7:45 PM      Component Value Range Comment   Hemoglobin A1C 9.0 (*) <5.7 %    Mean Plasma Glucose 212 (*) <117 mg/dL   GLUCOSE, CAPILLARY     Status: Abnormal   Collection Time   05/13/12  9:07 PM      Component Value Range Comment   Glucose-Capillary 114 (*) 70 - 99 mg/dL   GLUCOSE, CAPILLARY     Status: Abnormal   Collection Time   05/14/12  6:38 AM      Component Value Range Comment   Glucose-Capillary 165 (*) 70 - 99 mg/dL   GLUCOSE, CAPILLARY     Status: Abnormal   Collection Time   05/14/12 12:02 PM      Component Value Range Comment   Glucose-Capillary 104 (*) 70 - 99 mg/dL   GLUCOSE, CAPILLARY     Status: Abnormal   Collection Time   05/14/12  5:15 PM      Component Value Range Comment   Glucose-Capillary 157 (*) 70 - 99 mg/dL    Comment 1 Notify RN     GLUCOSE, CAPILLARY     Status: Abnormal    Collection Time   05/14/12  9:50 PM      Component Value Range Comment   Glucose-Capillary 115 (*) 70 - 99 mg/dL    Comment 1 Notify RN      Comment 2 Documented in Chart     GLUCOSE, CAPILLARY     Status: Abnormal   Collection Time   05/15/12  6:04 AM      Component Value Range Comment   Glucose-Capillary 164 (*) 70 - 99 mg/dL     Physical Findings: COWS:  COWS Total Score: 2   Diagnoses:  AXIS I: Major Depression, Recurrent; Cocaine Dependence  AXIS II: Deferred  AXIS III:  Past Medical History   Diagnosis  Date   .  Diabetes mellitus    .  Hypertension    .  Angina    .  Shortness of breath    .  Headache    .  Gout    .  Hyperlipidemia    .  HIV infection    .  Crack cocaine use    AXIS IV: Moderate  AXIS V: 50  Plan: Pt seen and evaluated. No acute issues noted.  PT c/o sedation with meds, lowered dose of Neurontin form 300 mg tid to 200 mg tid.  Initiated Ibuprofen for moderate pain.  Case for ambulation.   VS reviewed with team.  Pt agreeable with treatment plan, see orders. Did well upon last admission with Cymbalta and sobriety.  Will also see diabetic nurse today and is requesting residential Tx.        Cheryll Cockayne 05/15/2012, 11:54 AM

## 2012-05-15 NOTE — Progress Notes (Signed)
[facility] Adult Inpatient Family/Significant Other Suicide Prevention Education  Suicide Prevention Education:  Education Completed; Sister, Marisue Brooklyn at [phone]  has been identified by the patient as the family member/significant other with whom the patient will be residing, and identified as the person(s) who will aid the patient in the event of a mental health crisis (suicidal ideations/suicide attempt).  With written consent from the patient, the family member/significant other has been provided the following suicide prevention education, prior to the and/or following the discharge of the patient.  The suicide prevention education provided includes the following:  Suicide risk factors  Suicide prevention and interventions  National Suicide Hotline telephone number  [facility] assessment telephone number  [facility] Emergency Assistance [place] and/or Residential Mobile Crisis Unit telephone number  Request made of family/significant other to:  Remove weapons (e.g., guns, rifles, knives), all items previously/currently identified as safety concern.    Remove drugs/medications (over-the-counter, prescriptions, illicit drugs), all items previously/currently identified as a safety concern.  The family member/significant other verbalizes understanding of the suicide prevention education information provided.  The family member/significant other agrees to remove the items of safety concern listed above.  Sister would like to see pt go to a LONG term treatment center and that he is "not staying in tx long enough to make a lasting change". She does not know what else the to can do if he does not get into a treatment center. She does not know anything about the "[place]" center and advises that the pt needs not to just be someplace safe but be in [place] too.  Debria Garret 05/15/2012, 3:22 PM

## 2012-05-15 NOTE — Progress Notes (Signed)
[facility] Group Notes:  (Counselor/Nursing/MHT/Case Management/Adjunct)  05/15/2012 1:15 PM  Type of Therapy:  Group Therapy, Dance/Movement Therapy   Participation Level:  Minimal  Participation Quality:  Attentive  Affect:  Appropriate  Cognitive:  Appropriate  Insight:  Limited  Engagement in Group:  Limited  Engagement in Therapy:  Limited  Modes of Intervention:  Clarification, Problem-solving, Role-play, Socialization and Support  Summary of Progress/Problems: Group focused on feelings around relapse and recovery. Pt spoke about concepts of being honest with themselves, taking steps one day/one minute at a time and knowing that recovery is a life long process. Pt stated that his energy level is low but today he is grateful for "today".    Debria Garret 05/15/2012. 2:37 PM

## 2012-05-15 NOTE — Progress Notes (Signed)
D-Patient out in milieu interacting with peers and attending groups.  A- Quiet but appropriate. Rates depression and hopelessness at 9. "Off and on" SI and contracts for safety. Diarrhea,chills and tremors reported as withdrawal symptoms but did not request PRN medications.  Diabetic diet teaching reenforced. A- Compliant with scheduled medications.  Continue current POC and evaluation of treatment goals. Support and encouragement offered and 15' checks cont for safety.

## 2012-05-15 NOTE — Progress Notes (Signed)
Pt did not attend [place] group. Pt stayed in bed, sleeping.

## 2012-05-15 NOTE — Progress Notes (Signed)
Patient in bed this morning during this assessment. Writer asked how patient was doing and encouraged patient to come to the medication window for his insulin. Patient stated ; "I feel very depressed", his mood and affect sad and depressed. Writer asked patient what his stressors are and what he had in his mind. Pt stated that he has been on drug for so long and it made him sad and depressed. Writer encouraged patient to follow his treatment regimen, attend groups, use coping skill he acquire daily  take his medications as prescribed and give himself time and not to be too hard on himself. Pt receptive to encouragements.

## 2012-05-15 NOTE — Progress Notes (Signed)
[facility] Group Notes:  (Counselor/Nursing/MHT/Case Management/Adjunct)  05/15/2012 8:52 PM  Type of Therapy:  AA Meeting  Participation Level:  Active  Participation Quality:  Appropriate  Affect:  Appropriate  Cognitive:  Appropriate  Insight:  Good  Engagement in Group:  Good  Engagement in Therapy:  Good  Modes of Intervention:  Support  Summary of Progress/Problems:   Michael [name] 05/15/2012, 8:52 PM

## 2012-05-15 NOTE — Progress Notes (Signed)
[facility] Group Notes:  (Counselor/Nursing/MHT/Case Management/Adjunct)  05/15/2012 11:44 AM  Type of Therapy:  Psychoeducational Skills  Participation Level:  Active  Participation Quality:  Appropriate and Sharing  Affect:  Appropriate  Cognitive:  Appropriate  Insight:  Good  Engagement in Group:  Good  Engagement in Therapy:  Good  Modes of Intervention:  Education  Summary of Progress/Problems: Patient was engaged in a therapeutic activity entitled "Coping Skills Pictionary" to enhance their awareness on ways to cope with with day to day stressors. Patient was able to share his thoughts about relapsing and explained to the group that he wants to enter long term treatment upon discharging from [facility] and that he will attend support groups as a way to deter him from abusing drugs. Patient was provided support and encouragement.   Redmond Pulling O 05/15/2012, 11:44 AM

## 2012-05-15 NOTE — Progress Notes (Signed)
Psychoeducational Group Note  Date:  05/15/2012 Time:  1100  Group Topic/Focus:  Relapse Prevention Planning:   The focus of this group is to define relapse and discuss the need for planning to combat relapse.  Participation Level:  Active  Participation Quality:  Appropriate, Attentive, Sharing and Supportive  Affect:  Appropriate  Cognitive:  Alert and Appropriate  Insight:  Good  Engagement in Group:  Good  Additional Comments:  Pt attended relapse prevention group discussing his own personal view on relapse and recovery. Pt stated that he feels theres a difference between being sober and being clean. Pt stated that he feels relapse cant happen until he is completely clean and at this time he cant think about a relapse prevention plan because hes not in the right state of mind to form a plan.   Joneen Caraway 05/15/2012, 2:27 PM

## 2012-05-15 NOTE — Discharge Planning (Signed)
Michael [name] attended AM group, good participation.  Has changed his mind about Michael [name], now wants long term program.  Psychologist, sport and exercise at [facility] and got him appointment for Tues AM.  [name] is delighted, and will get his family to transport him on Tues.

## 2012-05-16 DIAGNOSIS — F1910 Other psychoactive substance abuse, uncomplicated: Secondary | ICD-10-CM

## 2012-05-16 DIAGNOSIS — F332 Major depressive disorder, recurrent severe without psychotic features: Principal | ICD-10-CM

## 2012-05-16 LAB — GLUCOSE, CAPILLARY
Glucose-Capillary: 174 mg/dL — ABNORMAL HIGH (ref 70–99)
Glucose-Capillary: 150 mg/dL — ABNORMAL HIGH (ref 70–99)

## 2012-05-16 NOTE — Progress Notes (Signed)
D.  Pt bright and pleasant on approach.  Denies SI/HI/hallucinations at this time.  Interacting appropriately within milieu.  PT paged twice today for cane, let AC know that Pt still does not have cane.  Positive for evening group, see group notes.  A.  Support and encouragement offered, will continue to monitor.  R.  Pt remains safe, in dayroom, no acute distress noted.

## 2012-05-16 NOTE — Progress Notes (Signed)
2 attempts to page PT for cane. Report to [facility] for follow-up.

## 2012-05-16 NOTE — Progress Notes (Signed)
[facility] MD Progress Note  05/16/2012 11:15 AM  Diagnosis:   Axis I: Major Depression, Recurrent severe and Substance Abuse Axis II: Deferred Axis III:  Past Medical History  Diagnosis Date  . Diabetes mellitus   . Hypertension   . Angina   . Shortness of breath   . Headache   . Gout   . Hyperlipidemia   . HIV infection   . Crack cocaine use     ADL's:  Intact  Sleep: Fair  Appetite:  Good  Suicidal Ideation:  Fleeting, passive thoughts only. Denies any plan or intent Homicidal Ideation:  Denies any thought, plan, or intent  Subjective: Nang was lying in bed asleep, and was awakened with verbal prompts. He reports that he is making some progress. He denies any suicidal or homicidal ideation. He denies any auditory or visual hallucinations. He wishes to remain abstinent from substances of abuse, and would like to increase his support network here in [place]. Any current cravings. He reports that his sleep is fair, and that his appetite is good. He is complaining of left knee pain, and has requested the use of a cane for ambulation.  AEB (as evidenced by):  Mental Status Examination/Evaluation: Objective:  Appearance: Casual  Eye Contact::  Good  Speech:  Clear and Coherent  Volume:  Normal  Mood:  Dysphoric  Affect:  Congruent  Thought Process:  Logical  Orientation:  Full  Thought Content:  Delusions  Suicidal Thoughts:  No  Homicidal Thoughts:  No  Memory:  Immediate;   Good Recent;   Good Remote;   Good  Judgement:  Fair  Insight:  Fair  Psychomotor Activity:  Decreased  Concentration:  Good  Recall:  Good  Akathisia:  No  Handed:    AIMS (if indicated):     Assets:  Communication Skills Desire for Improvement  Sleep:  Number of Hours: 6.25    Vital Signs:Blood pressure 129/80, pulse 69, temperature 98.2 F (36.8 C), temperature source Oral, resp. rate 18, height 5\' 6"  (1.676 m), weight 71.668 kg (158 lb). Current Medications: Current  Facility-Administered Medications  Medication Dose Route Frequency Provider Last Rate Last Dose  . allopurinol (ZYLOPRIM) tablet 200 mg  200 mg Oral q morning - 10a Encarnacion Slates, NP   200 mg at 05/15/12 1226  . alum & mag hydroxide-simeth (MAALOX/MYLANTA) 200-200-20 MG/5ML suspension 30 mL  30 mL Oral Q4H PRN Maryruth Eve, MD      . aspirin chewable tablet 81 mg  81 mg Oral Daily Encarnacion Slates, NP   81 mg at 05/16/12 0800  . diclofenac sodium (VOLTAREN) 1 % transdermal gel 1 application  1 application Topical QID Encarnacion Slates, NP   1 application at [id number]  . diltiazem (CARDIZEM CD) 24 hr capsule 240 mg  240 mg Oral Daily Encarnacion Slates, NP   240 mg at 05/16/12 0800  . DULoxetine (CYMBALTA) DR capsule 60 mg  60 mg Oral Daily Encarnacion Slates, NP   60 mg at 05/16/12 0800  . Emtricitab-Rilpivir-Tenofovir 200-25-300 MG TABS 1 tablet  1 tablet Oral Daily Encarnacion Slates, NP   1 tablet at 05/16/12 0802  . feeding supplement (GLUCERNA SHAKE) liquid 237 mL  237 mL Oral QHS Christie Beckers, RD   237 mL at 05/15/12 2000  . gabapentin (NEURONTIN) capsule 200 mg  200 mg Oral TID Alyson Kuroski-Mazzei, DO   200 mg at 05/16/12 0800  . ibuprofen (ADVIL,MOTRIN) tablet 800 mg  800 mg Oral Q8H PRN Alyson Kuroski-Mazzei, DO      . insulin aspart (novoLOG) injection 0-15 Units  0-15 Units Subcutaneous TID WC Encarnacion Slates, NP   2 Units at 05/16/12 0606  . insulin aspart (novoLOG) injection 4 Units  4 Units Subcutaneous TID WC Encarnacion Slates, NP   4 Units at 05/16/12 [id number]  . insulin glargine (LANTUS) injection 25 Units  25 Units Subcutaneous QHS Encarnacion Slates, NP   25 Units at 05/15/12 2106  . magnesium hydroxide (MILK OF MAGNESIA) suspension 30 mL  30 mL Oral Daily PRN Maryruth Eve, MD      . meloxicam (MOBIC) tablet 15 mg  15 mg Oral Daily Encarnacion Slates, NP   15 mg at 05/16/12 0759  . metFORMIN (GLUCOPHAGE) tablet 1,000 mg  1,000 mg Oral BID WC Encarnacion Slates, NP   1,000 mg at 05/16/12 0759  . metoprolol  succinate (TOPROL-XL) 24 hr tablet 25 mg  25 mg Oral Daily Encarnacion Slates, NP   25 mg at 05/16/12 0800  . simvastatin (ZOCOR) tablet 20 mg  20 mg Oral q1800 Encarnacion Slates, NP   20 mg at 05/15/12 1720  . traZODone (DESYREL) tablet 50 mg  50 mg Oral QHS Alyson Kuroski-Mazzei, DO   50 mg at 05/15/12 2105  . DISCONTD: acetaminophen (TYLENOL) tablet 650 mg  650 mg Oral Q6H PRN Maryruth Eve, MD      . DISCONTD: gabapentin (NEURONTIN) capsule 300 mg  300 mg Oral TID Encarnacion Slates, NP   100 mg at 05/15/12 1219    Lab Results:  Results for orders placed during the hospital encounter of 05/13/12 (from the past 48 hour(s))  GLUCOSE, CAPILLARY     Status: Abnormal   Collection Time   05/14/12 12:02 PM      Component Value Range Comment   Glucose-Capillary 104 (*) 70 - 99 mg/dL   GLUCOSE, CAPILLARY     Status: Abnormal   Collection Time   05/14/12  5:15 PM      Component Value Range Comment   Glucose-Capillary 157 (*) 70 - 99 mg/dL    Comment 1 Notify RN     GLUCOSE, CAPILLARY     Status: Abnormal   Collection Time   05/14/12  9:50 PM      Component Value Range Comment   Glucose-Capillary 115 (*) 70 - 99 mg/dL    Comment 1 Notify RN      Comment 2 Documented in Chart     GLUCOSE, CAPILLARY     Status: Abnormal   Collection Time   05/15/12  6:04 AM      Component Value Range Comment   Glucose-Capillary 164 (*) 70 - 99 mg/dL   GLUCOSE, CAPILLARY     Status: Abnormal   Collection Time   05/15/12 12:25 PM      Component Value Range Comment   Glucose-Capillary 149 (*) 70 - 99 mg/dL   GLUCOSE, CAPILLARY     Status: Abnormal   Collection Time   05/15/12  5:06 PM      Component Value Range Comment   Glucose-Capillary 159 (*) 70 - 99 mg/dL    Comment 1 Notify RN      Comment 2 Documented in Chart     GLUCOSE, CAPILLARY     Status: Abnormal   Collection Time   05/15/12  9:23 PM      Component Value Range Comment   Glucose-Capillary 140 (*)  70 - 99 mg/dL   GLUCOSE, CAPILLARY     Status: Abnormal    Collection Time   05/16/12  6:01 AM      Component Value Range Comment   Glucose-Capillary 143 (*) 70 - 99 mg/dL    Comment 1 Notify RN       Physical Findings: AIMS: Facial and Oral Movements Muscles of Facial Expression: None, normal Lips and Perioral Area: None, normal Jaw: None, normal Tongue: None, normal,Extremity Movements Upper (arms, wrists, hands, fingers): None, normal Lower (legs, knees, ankles, toes): None, normal, Trunk Movements Neck, shoulders, hips: None, normal, Overall Severity Severity of abnormal movements (highest score from questions above): None, normal Incapacitation due to abnormal movements: None, normal Patient's awareness of abnormal movements (rate only patient's report): No Awareness, Dental Status Current problems with teeth and/or dentures?: Yes (few missing teeth and poor hygiene) Does patient usually wear dentures?: No  CIWA:  CIWA-Ar Total: 0  COWS:  COWS Total Score: 2   Treatment Plan Summary: Daily contact with patient to assess and evaluate symptoms and progress in treatment Medication management  Plan: We will continue his current plan of care, and research followup options.  Chaniece Barbato 05/16/2012, 11:15 AM

## 2012-05-16 NOTE — Progress Notes (Signed)
Patient ID: Michael [name], male   DOB: 1951/04/17, 61 y.o.   MRN: [id number]  Pt. attended and participated in aftercare planning group. Pt. verbally accepted information on suicide prevention, warning signs to look for with suicide and crisis line numbers to use. Pt. listed their current anxiety level as 6 and their current depression level as 9. Pt. shared that he will be going to [facility] for 90 days and feels good about his plan. Pt. said, "I need it," when asked if he was ready for treatment.

## 2012-05-16 NOTE — Progress Notes (Signed)
D-Patient is pleasant and cooperative this shift. Attending groups and interacting with peers. A- Rates depression and hopelessness at 8. Denies SI. Continues to request cane for ambulation assistance. Follow up with PT done.  Appears motivated for recovery.  A- States "staying clean" as changes desired at d/c.  Continue current POC and evaluation of treatment goals.  Continue 15' checks for safety.

## 2012-05-16 NOTE — Progress Notes (Signed)
Patient ID: Michael [name], male   DOB: 1950-12-30, 61 y.o.   MRN: [id number]   [facility] Group Notes:  (Counselor/Nursing/MHT/Case Management/Adjunct)  05/16/2012 1:15 PM  Type of Therapy:  Group Therapy, Dance/Movement Therapy   Participation Level:  Active  Participation Quality:  Appropriate  Affect:  Appropriate  Cognitive:  Appropriate  Insight:  Good  Engagement in Group:  Good  Engagement in Therapy:  Good  Modes of Intervention:  Clarification, Problem-solving, Role-play, Socialization and Support  Summary of Progress/Problems: Therapist began group by inviting pts to share one thing they were going to take with them once they leave the hospital or one thing they are striving for in recovery. Group members drew a picture of a mountain which represented their struggles and battles. Pt shared that he is ready for a long-term treatment program and that he is committed to recovery. Pt shared mountain picture with group members around him opposed to the whole group.     Cassidi Long 05/16/2012. 3:29 PM

## 2012-05-17 LAB — GLUCOSE, CAPILLARY: Glucose-Capillary: 156 mg/dL — ABNORMAL HIGH (ref 70–99)

## 2012-05-17 NOTE — Progress Notes (Signed)
Patient ID: Michael [name], male   DOB: [birth year], 61 y.o.   MRN: [id number] Pt woke this morning with c/o rt foot being swollen.  Denied pain and foot was warm, but swollen.  Elevated foot with pillows under it as well as under left knee which he stated has been the problem d/t torn ligaments.  Cold packs applied to both the foot and knee,  Breakfast to be brought back to pt.

## 2012-05-17 NOTE — Progress Notes (Signed)
Psychoeducational Group Note  Date:  05/17/2012 Time:  0930  Group Topic/Focus:  Spirituality:   The focus of this group is to discuss how one's spirituality can aide in recovery.  Participation Level:  Did Not Attend  Joneen Caraway 05/17/2012, 11:01 AM

## 2012-05-17 NOTE — Progress Notes (Signed)
[facility] Group Notes:  (Counselor/Nursing/MHT/Case Management/Adjunct)  05/17/2012 1:15 PM  Type of Therapy:  Group Therapy, Dance/Movement Therapy   Participation Level:  Did Not Attend  Summary of Progress/Problems: pt. accepted the daily workbook on Healthy Supports.     [facility] 05/17/2012. 9:35 AM

## 2012-05-17 NOTE — Progress Notes (Signed)
[facility] Group Notes:  (Counselor/Nursing/MHT/Case Management/Adjunct)  05/17/2012 1:15 PM  Type of Therapy:  Group Therapy, Dance/Movement Therapy   Participation Level:  Active  Participation Quality:  Attentive, Sharing and Supportive  Affect:  Appropriate  Cognitive:  Appropriate  Insight:  Limited  Engagement in Group:  Limited  Engagement in Therapy:  Limited  Modes of Intervention:  Clarification, Problem-solving, Role-play, Socialization and Support  Summary of Progress/Problems: Pt spoke about how support relates to support we get from others and the support we give ourselves. Pt participated in the reading of the 12 Promises and how these offer hope that things will get better.  [place] 05/17/2012. 2:45 PM

## 2012-05-17 NOTE — Progress Notes (Signed)
Patient ID: Michael [name], male   DOB: May 12, 1951, 61 y.o.   MRN: [id number] Pt has been resting quietly tonight, eyes closed, resp reg, unlabored, no c/o's voiced.  Will continue to monitor q 15 minutes for safety.

## 2012-05-17 NOTE — Progress Notes (Signed)
[facility] MD Progress Note  05/17/2012 9:21 AM  Diagnosis:   Axis I: Major Depression, Recurrent severe and Substance Abuse Axis II: Deferred Axis III:  Past Medical History  Diagnosis Date  . Diabetes mellitus   . Hypertension   . Angina   . Shortness of breath   . Headache   . Gout   . Hyperlipidemia   . HIV infection   . Crack cocaine use    Subjective: Rex reports that he is doing pretty well today. He does endorse some concern about what is going to happen when he leaves here on Monday, but does not have an appointment to go to [facility] until Wednesday. He denies that he is having any cravings or withdrawal today. He endorses that he slept well last night. He got up and had a good breakfast this morning. He denies any suicidal or homicidal ideation. He denies any auditory or visual hallucinations.  ADL's:  Intact  Sleep: Good  Appetite:  Good  Suicidal Ideation:  Patient denies any thought, plan, or intent Homicidal Ideation:  Patient denies any thought, plan, or intent  AEB (as evidenced by):  Mental Status Examination/Evaluation: Objective:  Appearance: Bizarre, Casual and Fairly Groomed  Eye Contact::  Minimal  Speech:  Clear and Coherent  Volume:  Normal  Mood:  Anxious  Affect:  Non-Congruent  Thought Process:  Logical  Orientation:  Full  Thought Content:  WDL  Suicidal Thoughts:  No  Homicidal Thoughts:  No  Memory:  Immediate;   Good Recent;   Good Remote;   Good  Judgement:  Fair  Insight:  Lacking  Psychomotor Activity:  Normal  Concentration:  Good  Recall:  Good  Akathisia:  No  Handed:    AIMS (if indicated):     Assets:  Resilience  Sleep:  Number of Hours: 5.5    Vital Signs:Blood pressure 124/73, pulse 82, temperature 98.2 F (36.8 C), temperature source Oral, resp. rate 16, height 5\' 6"  (1.676 m), weight 71.668 kg (158 lb). Current Medications: Current Facility-Administered Medications  Medication Dose Route Frequency Provider Last Rate  Last Dose  . allopurinol (ZYLOPRIM) tablet 200 mg  200 mg Oral q morning - 10a Encarnacion Slates, NP   200 mg at 05/16/12 1210  . alum & mag hydroxide-simeth (MAALOX/MYLANTA) 200-200-20 MG/5ML suspension 30 mL  30 mL Oral Q4H PRN Maryruth Eve, MD      . aspirin chewable tablet 81 mg  81 mg Oral Daily Encarnacion Slates, NP   81 mg at 05/17/12 0819  . diclofenac sodium (VOLTAREN) 1 % transdermal gel 1 application  1 application Topical QID Encarnacion Slates, NP   1 application at [phone]  . diltiazem (CARDIZEM CD) 24 hr capsule 240 mg  240 mg Oral Daily Encarnacion Slates, NP   240 mg at 05/17/12 0819  . DULoxetine (CYMBALTA) DR capsule 60 mg  60 mg Oral Daily Encarnacion Slates, NP   60 mg at 05/17/12 0819  . Emtricitab-Rilpivir-Tenofovir 200-25-300 MG TABS 1 tablet  1 tablet Oral Daily Encarnacion Slates, NP   1 tablet at 05/16/12 0802  . feeding supplement (GLUCERNA SHAKE) liquid 237 mL  237 mL Oral QHS Christie Beckers, RD   237 mL at 05/16/12 2000  . gabapentin (NEURONTIN) capsule 200 mg  200 mg Oral TID Alyson Kuroski-Mazzei, DO   200 mg at 05/17/12 0819  . ibuprofen (ADVIL,MOTRIN) tablet 800 mg  800 mg Oral Q8H PRN Alyson Kuroski-Mazzei,  DO      . insulin aspart (novoLOG) injection 0-15 Units  0-15 Units Subcutaneous TID WC Encarnacion Slates, NP   3 Units at 05/17/12 0706  . insulin aspart (novoLOG) injection 4 Units  4 Units Subcutaneous TID WC Encarnacion Slates, NP   4 Units at 05/17/12 0704  . insulin glargine (LANTUS) injection 25 Units  25 Units Subcutaneous QHS Encarnacion Slates, NP   25 Units at 05/16/12 2133  . magnesium hydroxide (MILK OF MAGNESIA) suspension 30 mL  30 mL Oral Daily PRN Maryruth Eve, MD      . meloxicam (MOBIC) tablet 15 mg  15 mg Oral Daily Encarnacion Slates, NP   15 mg at 05/17/12 0820  . metFORMIN (GLUCOPHAGE) tablet 1,000 mg  1,000 mg Oral BID WC Encarnacion Slates, NP   1,000 mg at 05/17/12 0820  . metoprolol succinate (TOPROL-XL) 24 hr tablet 25 mg  25 mg Oral Daily Encarnacion Slates, NP   25 mg at  05/17/12 0819  . simvastatin (ZOCOR) tablet 20 mg  20 mg Oral q1800 Encarnacion Slates, NP   20 mg at 05/16/12 1722  . traZODone (DESYREL) tablet 50 mg  50 mg Oral QHS Alyson Kuroski-Mazzei, DO   50 mg at 05/16/12 2133    Lab Results:  Results for orders placed during the hospital encounter of 05/13/12 (from the past 48 hour(s))  GLUCOSE, CAPILLARY     Status: Abnormal   Collection Time   05/15/12 12:25 PM      Component Value Range Comment   Glucose-Capillary 149 (*) 70 - 99 mg/dL   GLUCOSE, CAPILLARY     Status: Abnormal   Collection Time   05/15/12  5:06 PM      Component Value Range Comment   Glucose-Capillary 159 (*) 70 - 99 mg/dL    Comment 1 Notify RN      Comment 2 Documented in Chart     GLUCOSE, CAPILLARY     Status: Abnormal   Collection Time   05/15/12  9:23 PM      Component Value Range Comment   Glucose-Capillary 140 (*) 70 - 99 mg/dL   GLUCOSE, CAPILLARY     Status: Abnormal   Collection Time   05/16/12  6:01 AM      Component Value Range Comment   Glucose-Capillary 143 (*) 70 - 99 mg/dL    Comment 1 Notify RN     GLUCOSE, CAPILLARY     Status: Abnormal   Collection Time   05/16/12 12:04 PM      Component Value Range Comment   Glucose-Capillary 152 (*) 70 - 99 mg/dL   GLUCOSE, CAPILLARY     Status: Abnormal   Collection Time   05/16/12  5:15 PM      Component Value Range Comment   Glucose-Capillary 174 (*) 70 - 99 mg/dL   GLUCOSE, CAPILLARY     Status: Abnormal   Collection Time   05/16/12  9:20 PM      Component Value Range Comment   Glucose-Capillary 150 (*) 70 - 99 mg/dL    Comment 1 Notify RN      Comment 2 Documented in Chart     GLUCOSE, CAPILLARY     Status: Abnormal   Collection Time   05/17/12  5:58 AM      Component Value Range Comment   Glucose-Capillary 156 (*) 70 - 99 mg/dL    Comment 1 Notify RN  Physical Findings: AIMS: Facial and Oral Movements Muscles of Facial Expression: None, normal Lips and Perioral Area: None, normal Jaw: None,  normal Tongue: None, normal,Extremity Movements Upper (arms, wrists, hands, fingers): None, normal Lower (legs, knees, ankles, toes): None, normal, Trunk Movements Neck, shoulders, hips: None, normal, Overall Severity Severity of abnormal movements (highest score from questions above): None, normal Incapacitation due to abnormal movements: None, normal Patient's awareness of abnormal movements (rate only patient's report): No Awareness, Dental Status Current problems with teeth and/or dentures?: Yes (few missing teeth and poor hygiene) Does patient usually wear dentures?: No  CIWA:  CIWA-Ar Total: 0  COWS:  COWS Total Score: 2   Treatment Plan Summary: Daily contact with patient to assess and evaluate symptoms and progress in treatment Medication management  Plan: We will continue his current plan of care, with a plan discharge for tomorrow, and subsequent followup at [facility] on Wednesday.  Arhaan Chesnut 05/17/2012, 9:21 AM

## 2012-05-17 NOTE — Progress Notes (Signed)
[facility] Group Notes:  (Counselor/Nursing/MHT/Case Management/Adjunct)  05/17/2012 6:33 PM  Type of Therapy:  Psychoeducational Skills  Participation Level:  None  Participation Quality:  Appropriate  Affect:  Appropriate  Cognitive:  Appropriate  Engagement in Group:  None  Engagement in Therapy:  None  Modes of Intervention:  Activity, Socialization and Support  Summary of Progress/Problems:Pt attended in group activity consisting of playing "Buzz Word". Group processed what they got out of this activity at the end of group.      Joneen Caraway 05/17/2012, 6:33 PM

## 2012-05-17 NOTE — Progress Notes (Signed)
[facility] MD Progress Note  05/17/2012 10:03 AM  Diagnosis:   Axis I: Major Depression, Recurrent severe and Substance Abuse Axis II: Deferred Axis III:  Past Medical History  Diagnosis Date  . Diabetes mellitus   . Hypertension   . Angina   . Shortness of breath   . Headache   . Gout   . Hyperlipidemia   . HIV infection   . Crack cocaine use    Subjective: Michael [name] is lying in bed this morning, complaining that his right foot was swollen this morning. He has not received the cane that has been ordered for him, and therefore he has been trying to decrease the use of his left leg due to knee pain which seems to cause some swelling in his right foot. Otherwise he reports he is doing well. He slept well last night, and he was able to eat a good breakfast this morning. He denies any withdrawal or cravings. He denies any suicidal or homicidal ideation. He denies any auditory or visual hallucinations.  ADL's:  Impaired  Sleep: Good  Appetite:  Good  Suicidal Ideation:  Patient denies any thought, plan, or intent Homicidal Ideation:  Patient denies any thought, plan, or intent  AEB (as evidenced by):  Mental Status Examination/Evaluation: Objective:  Appearance: Casual  Eye Contact::  Good  Speech:  Clear and Coherent  Volume:  Normal  Mood:  Dysphoric  Affect:  Congruent  Thought Process:  Logical  Orientation:  Full  Thought Content:  WDL  Suicidal Thoughts:  No  Homicidal Thoughts:  No  Memory:  Immediate;   Good Recent;   Good Remote;   Good  Judgement:  Good  Insight:  Good  Psychomotor Activity:  Normal  Concentration:  Good  Recall:  Good  Akathisia:  No  Handed:    AIMS (if indicated):     Assets:  Communication Skills Desire for Improvement Resilience  Sleep:  Number of Hours: 5.5    Vital Signs:Blood pressure 124/73, pulse 82, temperature 98.2 F (36.8 C), temperature source Oral, resp. rate 16, height 5\' 6"  (1.676 m), weight 71.668 kg (158 lb). Current  Medications: Current Facility-Administered Medications  Medication Dose Route Frequency Provider Last Rate Last Dose  . allopurinol (ZYLOPRIM) tablet 200 mg  200 mg Oral q morning - 10a Encarnacion Slates, NP   200 mg at 05/16/12 1210  . alum & mag hydroxide-simeth (MAALOX/MYLANTA) 200-200-20 MG/5ML suspension 30 mL  30 mL Oral Q4H PRN Maryruth Eve, MD      . aspirin chewable tablet 81 mg  81 mg Oral Daily Encarnacion Slates, NP   81 mg at 05/17/12 0819  . diclofenac sodium (VOLTAREN) 1 % transdermal gel 1 application  1 application Topical QID Encarnacion Slates, NP   1 application at [id number]  . diltiazem (CARDIZEM CD) 24 hr capsule 240 mg  240 mg Oral Daily Encarnacion Slates, NP   240 mg at 05/17/12 0819  . DULoxetine (CYMBALTA) DR capsule 60 mg  60 mg Oral Daily Encarnacion Slates, NP   60 mg at 05/17/12 0819  . Emtricitab-Rilpivir-Tenofovir 200-25-300 MG TABS 1 tablet  1 tablet Oral Daily Encarnacion Slates, NP   1 tablet at 05/16/12 0802  . feeding supplement (GLUCERNA SHAKE) liquid 237 mL  237 mL Oral QHS Christie Beckers, RD   237 mL at 05/16/12 2000  . gabapentin (NEURONTIN) capsule 200 mg  200 mg Oral TID Alyson Kuroski-Mazzei, DO   200  mg at 05/17/12 0819  . ibuprofen (ADVIL,MOTRIN) tablet 800 mg  800 mg Oral Q8H PRN Alyson Kuroski-Mazzei, DO      . insulin aspart (novoLOG) injection 0-15 Units  0-15 Units Subcutaneous TID WC Encarnacion Slates, NP   3 Units at 05/17/12 0706  . insulin aspart (novoLOG) injection 4 Units  4 Units Subcutaneous TID WC Encarnacion Slates, NP   4 Units at 05/17/12 0704  . insulin glargine (LANTUS) injection 25 Units  25 Units Subcutaneous QHS Encarnacion Slates, NP   25 Units at 05/16/12 2133  . magnesium hydroxide (MILK OF MAGNESIA) suspension 30 mL  30 mL Oral Daily PRN Maryruth Eve, MD      . meloxicam (MOBIC) tablet 15 mg  15 mg Oral Daily Encarnacion Slates, NP   15 mg at 05/17/12 0820  . metFORMIN (GLUCOPHAGE) tablet 1,000 mg  1,000 mg Oral BID WC Encarnacion Slates, NP   1,000 mg at 05/17/12 0820    . metoprolol succinate (TOPROL-XL) 24 hr tablet 25 mg  25 mg Oral Daily Encarnacion Slates, NP   25 mg at 05/17/12 0819  . simvastatin (ZOCOR) tablet 20 mg  20 mg Oral q1800 Encarnacion Slates, NP   20 mg at 05/16/12 1722  . traZODone (DESYREL) tablet 50 mg  50 mg Oral QHS Alyson Kuroski-Mazzei, DO   50 mg at 05/16/12 2133    Lab Results:  Results for orders placed during the hospital encounter of 05/13/12 (from the past 48 hour(s))  GLUCOSE, CAPILLARY     Status: Abnormal   Collection Time   05/15/12 12:25 PM      Component Value Range Comment   Glucose-Capillary 149 (*) 70 - 99 mg/dL   GLUCOSE, CAPILLARY     Status: Abnormal   Collection Time   05/15/12  5:06 PM      Component Value Range Comment   Glucose-Capillary 159 (*) 70 - 99 mg/dL    Comment 1 Notify RN      Comment 2 Documented in Chart     GLUCOSE, CAPILLARY     Status: Abnormal   Collection Time   05/15/12  9:23 PM      Component Value Range Comment   Glucose-Capillary 140 (*) 70 - 99 mg/dL   GLUCOSE, CAPILLARY     Status: Abnormal   Collection Time   05/16/12  6:01 AM      Component Value Range Comment   Glucose-Capillary 143 (*) 70 - 99 mg/dL    Comment 1 Notify RN     GLUCOSE, CAPILLARY     Status: Abnormal   Collection Time   05/16/12 12:04 PM      Component Value Range Comment   Glucose-Capillary 152 (*) 70 - 99 mg/dL   GLUCOSE, CAPILLARY     Status: Abnormal   Collection Time   05/16/12  5:15 PM      Component Value Range Comment   Glucose-Capillary 174 (*) 70 - 99 mg/dL   GLUCOSE, CAPILLARY     Status: Abnormal   Collection Time   05/16/12  9:20 PM      Component Value Range Comment   Glucose-Capillary 150 (*) 70 - 99 mg/dL    Comment 1 Notify RN      Comment 2 Documented in Chart     GLUCOSE, CAPILLARY     Status: Abnormal   Collection Time   05/17/12  5:58 AM      Component Value Range  Comment   Glucose-Capillary 156 (*) 70 - 99 mg/dL    Comment 1 Notify RN       Physical Findings: AIMS: Facial and Oral  Movements Muscles of Facial Expression: None, normal Lips and Perioral Area: None, normal Jaw: None, normal Tongue: None, normal,Extremity Movements Upper (arms, wrists, hands, fingers): None, normal Lower (legs, knees, ankles, toes): None, normal, Trunk Movements Neck, shoulders, hips: None, normal, Overall Severity Severity of abnormal movements (highest score from questions above): None, normal Incapacitation due to abnormal movements: None, normal Patient's awareness of abnormal movements (rate only patient's report): No Awareness, Dental Status Current problems with teeth and/or dentures?: Yes (few missing teeth and poor hygiene) Does patient usually wear dentures?: No  CIWA:  CIWA-Ar Total: 0  COWS:  COWS Total Score: 2   Treatment Plan Summary: Daily contact with patient to assess and evaluate symptoms and progress in treatment Medication management  Plan: We will continue his current plan of care with plans to [name] on Tuesday, August 13. We will continue to make attempts to provide a cane for Mr. Okubo so he is better able to angulate [facility] 05/17/2012, 10:03 AM

## 2012-05-17 NOTE — Progress Notes (Signed)
[place] Group Notes:  (Counselor/Nursing/MHT/Case Management/Adjunct)  05/16/2012 2100  Type of Therapy:  wrap up group  Participation Level:  Minimal  Participation Quality:  Appropriate, Attentive and Sharing  Affect:  Appropriate  Cognitive:  Alert and Appropriate  Insight:  Limited  Engagement in Group:  Good  Engagement in Therapy:  unknown to this Probation officer  Modes of Intervention:  Clarification, Education and Support  Summary of Progress/Problems:Patient stated that the meetings were awesome today and that he was glad he was apart of this group.  Pt stated that he was grateful for himself and that he is alive.  Pt stated that his goal was to "keep on going and don't look back".   Michael [name] 05/17/2012, 3:31 AM

## 2012-05-17 NOTE — Progress Notes (Signed)
D-Patient is pleasant and cooperative. No group attendance this am. A- R foot edema and discomfort. Walker given due to PT refusing to bring cane. Rates depression and hopelessness at 7. Denies SI. R- Support and encouragement.  Continue current POC and evaluation of treatment goals. Diabetic diet teaching reenforced.  Continue 15' checks for safety.

## 2012-05-18 LAB — GLUCOSE, CAPILLARY
Glucose-Capillary: 121 mg/dL — ABNORMAL HIGH (ref 70–99)
Glucose-Capillary: 145 mg/dL — ABNORMAL HIGH (ref 70–99)
Glucose-Capillary: 179 mg/dL — ABNORMAL HIGH (ref 70–99)

## 2012-05-18 MED ORDER — GABAPENTIN 100 MG PO CAPS: 100.0000 mg | ORAL_CAPSULE | Freq: Three times a day (TID) | ORAL | Status: DC

## 2012-05-18 MED ORDER — GABAPENTIN 100 MG PO CAPS
100.0000 mg | ORAL_CAPSULE | Freq: Three times a day (TID) | ORAL | Status: DC
  Filled 2012-05-18: qty 1

## 2012-05-18 MED ORDER — INSULIN ASPART 100 UNIT/ML ~~LOC~~ SOLN: 4.0000 [IU] | Freq: Three times a day (TID) | SUBCUTANEOUS | Status: DC

## 2012-05-18 NOTE — Progress Notes (Signed)
[place] Group Notes:  (Counselor/Nursing/MHT/Case Management/Adjunct)  05/18/2012 6:31 PM  Type of Therapy:  Psychoeducational Skills  Participation Level:  Active  Participation Quality:  Appropriate  Affect:  Appropriate  Cognitive:  Appropriate  Insight:  Good  Engagement in Group:  Good  Engagement in Therapy:  Good  Modes of Intervention:  Support  Summary of Progress/Problems:   Michael [name] 05/18/2012, 6:31 PM

## 2012-05-18 NOTE — Progress Notes (Signed)
[place] Group Notes:  (Counselor/Nursing/MHT/Case Management/Adjunct)  05/18/2012  2:30 PM  Type of Therapy: Group Therapy   Participation Level: Minimal   Participation Quality: Limited  Affect: Appropriate  Cognitive: oriented, alert   Insight: Limited  Engagement in Group: Limited  Modes of Intervention: Clarification, Education, Problem-solving, Socialization,  Encouragement and Support, Activity   Summary of Progress/Problems: Pt participated in group by listening and self disclosing.  After a brief check-in, therapist introduced the topic of the importance of maintaining a balanced lifestyle.   Therapist encouraged patients to identify areas in their lives that are out of balance, and what needed to change to maintain a balance. Pt stated he was planning to stay away from people and places where drugs and alcohol are used.  He stated he needed a structured lifestyle in order to maintain sobriety.  Therapist guided patients in problem solving ways to get these areas back in balance and how to set boundaries.  Pt agreed to focus on making positive changes.  Patient actively participated in the Positive Affirmation Activity by giving and receiving positive affirmations to and from others.  Pt smiled and agreed this exercise was mood elevating. Pt was minimally engaged in the process. Therapist offered support and encouragement.  Minimal progress noted.  Intervention effective.         Jinny Blossom 05/18/2012  12:30 PM

## 2012-05-18 NOTE — Progress Notes (Signed)
[facility] Case Management Discharge Plan:  Will you be returning to the same living situation after discharge: No. At discharge, do you have transportation home?:Yes,  Brother to transport to [place] you have the ability to pay for your medications:Yes,  insurance  Interagency Information:     Release of information consent forms completed and in the chart;  Patient's signature needed at discharge.  Patient to Follow up at:  Follow-up Information    Follow up with [facility] on 05/19/2012. (8 AM sharp with Timmothy Sours)    Contact information:   [place] [phone]         Patient denies SI/HI:   Yes,  yes    Land and Suicide Prevention discussed:  Yes,  yes  Barrier to discharge identified:No.  Summary and Recommendations:   Trish Mage 05/18/2012, 10:37 AM

## 2012-05-18 NOTE — Progress Notes (Signed)
Pt has been up for all groups today.  He was given a cane from PT today that he will be allowed to take with him at discharge.  He reported sleep fair at 6 hours per him and 5.5 per MHT documentation.  He reports appetite good and energy low and his ability to pay attention as improving.  He rated he depression a 7 hopelessness a 6 and denied any anxiety on his self-inventory.  He denied any physical problems of withdrawal.  He did check off lightheaded and dizziness on his physical complaints and we discussed how he needs to change position slowly and he voiced understanding.  He was given MOM this morning but no results thus far.  He denied any S/H ideations or A/V hallucinations.  He plans to go to [facility] tomorrow to be there by 0800 with Michael [name].  His brother is supposed to pick him up in the morning.

## 2012-05-18 NOTE — Progress Notes (Signed)
Patient ID: Michael [name], male   DOB: 1951/10/01, 61 y.o.   MRN: [id number]  Problem: Major Depression, Cocaine Abuse  D: Pt pleasant and sociable with peers, interacting well in milieu.  A: Monitor patient Q 15 minutes for safety, encourage staff/peer interaction and group participation. Administer medications as ordered by MD.  R: Pt compliant with medications and participated in group session. Pt denies SI or plans to harm himself at this time. No inappropriate behaviors noted.

## 2012-05-18 NOTE — Progress Notes (Signed)
Patient did attend this evenings speaker [place] meeting.

## 2012-05-18 NOTE — [facility] Suicide Risk Assessment (Signed)
Suicide Risk Assessment  Discharge Assessment     Demographic factors:  Low socioeconomic status;Living alone;Divorced or widowed;Unemployed  Current Mental Status Per Nursing Assessment: On Admission:  Self-harm thoughts (contracts for safety and no plan) At Discharge: Pt denied any SI/HI/thoughts of self harm or acute psychiatric issues in treatment team with clinical, nursing and medical team present.  Current Mental Status Per Physician: Patient seen and evaluated. Chart reviewed. Patient stated that his mood was "much better".  Continued notation of sedation, agreed to further decrease gabapentin and d/c Trazodone. His affect was mood congruent and constricted. He denied any current thoughts of self injurious behavior, suicidal ideation or homicidal ideation. There were no auditory or visual hallucinations, paranoia, delusional thought processes, or mania noted. Thought process was linear and goal directed. No psychomotor agitation or retardation was noted. His speech was normal rate, tone and volume. Eye contact was good. Judgment and insight are fair. Patient has been up and engaged on the unit. No acute safety concerns reported from team. Pt willing to sue cane as directed form PT. Using walker on unit. Ordered cane w/o response from PT, calling today about this again before discharge.  No reported CP/SOB/N/V/D/HA.   Loss Factors: Decline in physical health  Historical Factors: sig comorbid MH & SUDs    Risk Reduction Factors:  Sense of responsibility to family;Religious beliefs about death; willing to go to [facility]  Diagnoses:  AXIS I: Cocaine Dependence; Depressive Disorder NOS  AXIS II: Deferred  AXIS III:  Past Medical History   Diagnosis  Date   .  Diabetes mellitus    .  Hypertension    .  Angina    .  Shortness of breath    .  Headache    .  Gout    .  Hyperlipidemia    .  HIV infection    .  Crack cocaine use    AXIS IV: Moderate  AXIS V:  50  Cognitive Features That Contribute To Risk: limited insight.  Suicide Risk: Pt viewed as a chronic increased risk of harm to self in light of his past hx and risk factors.  No acute safety concerns on the unit.  Pt contracting for safety and stable for discharge in am to [facility].  Plan Of Care/Follow-up recommendations: Pt seen and evaluated in treatment team. Chart reviewed.  Pt stable for and requesting discharge in am to [facility]. Pt contracting for safety and does not currently meet [place] involuntary commitment criteria for continued hospitalization against his will.  Mental health treatment, medication management and continued sobriety will mitigate against the potential increased risk of harm to self and/or others.  Discussed the importance of recovery further with pt, as well as, tools to move forward in a healthy & safe manner.  Pt agreeable with the plan.  Discussed with the team.  Please see orders, follow up appointments per AVS and full discharge summary.  Recommend follow up with NA.  Diet: Heart Healthy/Diabetic.  Activity: As tolerated.     Cheryll Cockayne 05/18/2012, 7:04 PM

## 2012-05-18 NOTE — Progress Notes (Signed)
Psychoeducational Group Note  Date:  05/18/2012 Time:  1000  Group Topic/Focus:  Wellness Toolbox:   The focus of this group is to discuss various aspects of wellness, balancing those aspects and exploring ways to increase the ability to experience wellness.  Patients will create a wellness toolbox for use upon discharge.  Participation Level:  Active  Participation Quality:  Appropriate, Sharing and Supportive  Affect:  Appropriate  Cognitive:  Appropriate  Insight:  Good  Engagement in Group:  Good  Additional Comments:  none  Yazlyn Wentzel M 05/18/2012, 10:59 AM

## 2012-05-19 DIAGNOSIS — F1420 Cocaine dependence, uncomplicated: Secondary | ICD-10-CM

## 2012-05-19 DIAGNOSIS — F329 Major depressive disorder, single episode, unspecified: Secondary | ICD-10-CM

## 2012-05-19 DIAGNOSIS — F3289 Other specified depressive episodes: Secondary | ICD-10-CM

## 2012-05-19 LAB — GLUCOSE, CAPILLARY: Glucose-Capillary: 112 mg/dL — ABNORMAL HIGH (ref 70–99)

## 2012-05-19 NOTE — Progress Notes (Signed)
Patient ID: Michael [name], male   DOB: January 18, 1951, 61 y.o.   MRN: [id number]  Pt discharged to [facility]; Pt's brother to provide transportation via private vehicle. Pt verbalizes an understanding of discharge instructions including medication administration and follow up appointments. Pt denies SI or plans to harm himself. No s/s of distress noted on discharge.

## 2012-05-20 NOTE — Progress Notes (Deleted)
Appointment was cancelled.

## 2012-05-20 NOTE — Progress Notes (Signed)
This encounter was created in error - please disregard.

## 2012-05-20 NOTE — Progress Notes (Signed)
Patient Discharge Instructions:  After Visit Summary (AVS):   Faxed to:  05/20/2012 Psychiatric Admission Assessment Note:   Faxed to:  05/20/2012 Suicide Risk Assessment - Discharge Assessment:   Faxed to:  05/20/2012 Faxed/Sent to the Next Level Care provider:  05/20/2012  Faxed to [facility] @ [phone]  Jola Baptist, 05/20/2012, 5:12 PM

## 2012-05-28 ENCOUNTER — Encounter: Payer: Self-pay | Admitting: Family Medicine

## 2012-05-28 ENCOUNTER — Ambulatory Visit (INDEPENDENT_AMBULATORY_CARE_PROVIDER_SITE_OTHER): Payer: Medicare Other | Admitting: Internal Medicine

## 2012-05-28 ENCOUNTER — Ambulatory Visit (INDEPENDENT_AMBULATORY_CARE_PROVIDER_SITE_OTHER): Payer: Medicare Other | Admitting: Family Medicine

## 2012-05-28 ENCOUNTER — Encounter: Payer: Self-pay | Admitting: Internal Medicine

## 2012-05-28 VITALS — BP 129/73 | HR 74 | Temp 97.1°F | Ht 67.0 in | Wt 173.5 lb

## 2012-05-28 VITALS — BP 117/77 | HR 73 | Ht 68.0 in | Wt 172.0 lb

## 2012-05-28 DIAGNOSIS — E1142 Type 2 diabetes mellitus with diabetic polyneuropathy: Secondary | ICD-10-CM

## 2012-05-28 DIAGNOSIS — IMO0002 Reserved for concepts with insufficient information to code with codable children: Secondary | ICD-10-CM

## 2012-05-28 DIAGNOSIS — M25569 Pain in unspecified knee: Secondary | ICD-10-CM

## 2012-05-28 DIAGNOSIS — I10 Essential (primary) hypertension: Secondary | ICD-10-CM

## 2012-05-28 DIAGNOSIS — E1165 Type 2 diabetes mellitus with hyperglycemia: Secondary | ICD-10-CM

## 2012-05-28 DIAGNOSIS — E1149 Type 2 diabetes mellitus with other diabetic neurological complication: Secondary | ICD-10-CM

## 2012-05-28 DIAGNOSIS — M25579 Pain in unspecified ankle and joints of unspecified foot: Secondary | ICD-10-CM

## 2012-05-28 DIAGNOSIS — M25572 Pain in left ankle and joints of left foot: Secondary | ICD-10-CM

## 2012-05-28 DIAGNOSIS — F1410 Cocaine abuse, uncomplicated: Secondary | ICD-10-CM

## 2012-05-28 DIAGNOSIS — M25562 Pain in left knee: Secondary | ICD-10-CM

## 2012-05-28 LAB — GLUCOSE, CAPILLARY: Glucose-Capillary: 287 mg/dL — ABNORMAL HIGH (ref 70–99)

## 2012-05-28 MED ORDER — INSULIN ASPART 100 UNIT/ML ~~LOC~~ SOLN: SUBCUTANEOUS | Status: DC

## 2012-05-28 NOTE — Assessment & Plan Note (Signed)
believe this is mostly gait related from his knee pain.  Shown home exercises.  Tylenol, icing.  Consider radiographs, joint injection if arthritis shown though doubt this is reason for his pain.

## 2012-05-28 NOTE — Assessment & Plan Note (Signed)
MRI shows some chondromalacia but otherwise normal.  Unfortunately unable to afford PT.  Reviewed basic lower extremity strengthening exercises to do daily.  Sports insoles should help add arch support, prevent overpronation and decrease pain behind patella.  Consider a different oral NSAID though a lot of these decrease the serum concentration of Complera - will discuss with his ID physician.  Do not think surgical intervention would be of benefit to him if he continues to have pain.  F/u prn.

## 2012-05-28 NOTE — Progress Notes (Signed)
Subjective:   Patient ID: Michael [name] male   DOB: 09-04-1951 61 y.o.   MRN: [id number]  HPI: Michael [name] is a very pleasant 61 y.o. man with history of HIV diabetes, hypertension, hyperlipidemia and history of cocaine abuse who presents today for followup of his diabetes. He was recently hospitalized for suicidal ideation and relapse of cocaine abuse with requested detox.   Diabetes:  During and since hospitalization, he has no longer been taking Lantus, but has been taking NovoLog 4 units 4 times daily (before each meal and before bed). Review of his glucometer reveals improved blood sugars. He is within target 72% of the time, with high as 22% of the time. The majority of his high blood sugars are before dinner. He tries to be cognizant of what he chooses to eat from the cafeteria. He has not been able to exercise given his knee and leg pain, for which she is followed by sports medicine. He has an appointment with sports medicine later on today. He denies symptoms of hypoglycemia including dizziness, diaphoresis, weakness. He denies symptoms of hyperglycemia including polyuria, polydipsia, polyphasia, headaches and fatigue.  Past Medical History  Diagnosis Date  . Diabetes mellitus   . Hypertension   . Angina   . Shortness of breath   . Headache   . Gout   . Hyperlipidemia   . HIV infection   . Crack cocaine use    Current Outpatient Prescriptions  Medication Sig Dispense Refill  . allopurinol (ZYLOPRIM) 100 MG tablet Take 200 mg by mouth every morning. For gout.      Marland Kitchen aspirin 81 MG chewable tablet Chew 81 mg by mouth daily. For blood thinner.      . diclofenac sodium (VOLTAREN) 1 % GEL Apply 1 application topically 4 (four) times daily. To left knee and right lower leg/ankle.      . diltiazem (CARDIZEM CD) 240 MG 24 hr capsule Take 240 mg by mouth daily.      . DULoxetine (CYMBALTA) 60 MG capsule Take 60 mg by mouth daily.      . Emtricitab-Rilpivir-Tenofovir (COMPLERA)  200-25-300 MG TABS Take 1 tablet by mouth daily.      Marland Kitchen gabapentin (NEURONTIN) 100 MG capsule Take 1 capsule (100 mg total) by mouth 3 (three) times daily. For pain & anxiety.  90 capsule  0  . metFORMIN (GLUCOPHAGE) 1000 MG tablet Take 1,000 mg by mouth 2 (two) times daily with a meal.      . metoprolol succinate (TOPROL-XL) 25 MG 24 hr tablet Take 25 mg by mouth daily.      . pravastatin (PRAVACHOL) 40 MG tablet Take 40 mg by mouth every evening.      . insulin aspart (NOVOLOG) 100 UNIT/ML injection Inject 4units before breakfast & lunch; Inject 8units before dinner  30 mL  0  . PARoxetine (PAXIL) 20 MG tablet Take 20 mg by mouth every morning.      Marland Kitchen DISCONTD: insulin aspart (NOVOLOG) 100 UNIT/ML injection Inject 4 Units into the skin 3 (three) times daily with meals. For diabetes.  1 vial  0   Family History  Problem Relation Age of Onset  . Diabetes Mother   . Diabetes Father   . Diabetes Brother    History   Social History  . Marital Status: Single    Spouse Name: N/A    Number of Children: N/A  . Years of Education: N/A   Social History Main Topics  . Smoking  status: Never Smoker   . Smokeless tobacco: Never Used  . Alcohol Use: No  . Drug Use: Yes    Special: "Crack" cocaine, Cocaine  . Sexually Active: No     accepted condoms   Other Topics Concern  . None   Social History Narrative  . None   Review of Systems: General: no fevers, chills, changes in weight, changes in appetite Skin: no rash HEENT: no blurry vision, hearing changes, sore throat Pulm: no dyspnea, coughing, wheezing CV: no chest pain, palpitations, shortness of breath Abd: no abdominal pain, nausea/vomiting, diarrhea/constipation GU: no dysuria, hematuria, polyuria Neuro: no weakness, numbness, or tingling   Objective:  Physical Exam: Filed Vitals:   05/28/12 0933 05/28/12 0934  BP: 129/73   Pulse: 74   Temp: 97.1 F (36.2 C)   TempSrc: Oral   Height: 5\' 7"  (1.702 m)   Weight: 173 lb 8  oz (78.699 kg)   SpO2:  100%   Constitutional: Vital signs reviewed.  Patient is a well-developed and well-nourished man in no acute distress and cooperative with exam. Mouth: no erythema or exudates, MMM, poor dentition Eyes: PERRL, EOMI, conjunctivae normal, No scleral icterus.  Cardiovascular: RRR, S1 normal, S2 normal, no MRG, pulses symmetric and intact bilaterally Pulmonary/Chest: CTAB, no wheezes, rales, or rhonchi Abdominal: Soft. Non-tender, non-distended, bowel sounds are normal, no masses, organomegaly, or guarding present.  Neurological: A&O x3 Skin: Warm, dry and intact. No rash, cyanosis, or clubbing.  Psychiatric: Normal mood and affect. speech and behavior is normal. Judgment and thought content normal. Cognition and memory are normal.   Assessment & Plan:   Case and care discussed with Dr. Stann Mainland. Please see problem-oriented charting for further details. Patient to return in 3 months for diabetes followup. He was instructed to return sooner if he notices blood sugars rising greater than 200 frequently.

## 2012-05-28 NOTE — Progress Notes (Addendum)
Subjective:    Patient ID: Michael [name], male    DOB: Mar 04, 1951, 61 y.o.   MRN: [id number]  PCP: Dr. Victorio Palm  Knee Pain    61 yo M here for f/u left knee.  6/13: Patient reports he first experienced left knee pain 3 years ago - was topping trees when a limb broke causing him to fall but was caught by a large ladder though developed pain medial left knee. No swelling or bruising after this. Wore left knee brace and he improved though has had intermittent problems since then. Then 3 weeks ago was getting into a van when his knee gave out and caught shin on part of the [place]. Pain primarily proximal shin and anterior knee. Taking mobic. Has not been icing or using heat. No catching, locking.  7/11: Patient reports injection helped him for about a week - eased pain off but did not take it away. Having medial left knee pain radiates down calf into ankle. No swelling or bruising now - has had some mild swelling medial knee per his report. Left knee pops. Difficulty sleeping at night. Buckles on him at times. No catching, locking.  8/22: Patient was unable to afford PT. States pain in left knee has persisted without much improvement. Taking voltaren gel as only pain medication currently - does help some. Injection only helped for about a week. Feels like he's getting pain in left ankle and right ankle as a result of walking with cane. No injuries to these areas. Has been icing knee. Feels unstable. No catching, locking.  Past Medical History  Diagnosis Date  . Diabetes mellitus   . Hypertension   . Angina   . Shortness of breath   . Headache   . Gout   . Hyperlipidemia   . HIV infection   . Crack cocaine use     Current Outpatient Prescriptions on File Prior to Visit  Medication Sig Dispense Refill  . allopurinol (ZYLOPRIM) 100 MG tablet Take 200 mg by mouth every morning. For gout.      Marland Kitchen aspirin 81 MG chewable tablet Chew 81 mg by mouth daily. For blood thinner.       . diltiazem (CARDIZEM CD) 240 MG 24 hr capsule Take 240 mg by mouth daily.      . DULoxetine (CYMBALTA) 60 MG capsule Take 60 mg by mouth daily.      . Emtricitab-Rilpivir-Tenofovir (COMPLERA) 200-25-300 MG TABS Take 1 tablet by mouth daily.      Marland Kitchen gabapentin (NEURONTIN) 100 MG capsule Take 1 capsule (100 mg total) by mouth 3 (three) times daily. For pain & anxiety.  90 capsule  0  . insulin aspart (NOVOLOG) 100 UNIT/ML injection Inject 4units before breakfast & lunch; Inject 8units before dinner  30 mL  0  . metFORMIN (GLUCOPHAGE) 1000 MG tablet Take 1,000 mg by mouth 2 (two) times daily with a meal.      . metoprolol succinate (TOPROL-XL) 25 MG 24 hr tablet Take 25 mg by mouth daily.      Marland Kitchen PARoxetine (PAXIL) 20 MG tablet Take 20 mg by mouth every morning.      . pravastatin (PRAVACHOL) 40 MG tablet Take 40 mg by mouth every evening.      Marland Kitchen DISCONTD: insulin aspart (NOVOLOG) 100 UNIT/ML injection Inject 4 Units into the skin 3 (three) times daily with meals. For diabetes.  1 vial  0    Past Surgical History  Procedure Date  . Prostate surgery   .  Bowel resection   . Back surgery     1988    No Known Allergies  History   Social History  . Marital Status: Single    Spouse Name: N/A    Number of Children: N/A  . Years of Education: N/A   Occupational History  . Not on file.   Social History Main Topics  . Smoking status: Never Smoker   . Smokeless tobacco: Never Used  . Alcohol Use: No  . Drug Use: Yes    Special: "Crack" cocaine, Cocaine  . Sexually Active: No     accepted condoms   Other Topics Concern  . Not on file   Social History Narrative  . No narrative on file    Family History  Problem Relation Age of Onset  . Diabetes Mother   . Diabetes Father   . Diabetes Brother     BP 117/77  Pulse 73  Ht 5\' 8"  (1.727 m)  Wt 172 lb (78.019 kg)  BMI 26.15 kg/m2  Review of Systems  See HPI above.    Objective:   Physical Exam  Gen: NAD  L  knee: No gross deformity, ecchymoses, swelling.   TTP medial joint line, post patellar facets, less lat joint line.  No other TTP. FROM. Negative ant/post drawers. Negative valgus/varus testing. Negative lachmanns. Negative mcmurrays, apleys (mild medial pain), patellar apprehension, clarkes. NV intact distally.  Bilateral ankles: No gross deformity, swelling, ecchymoses FROM TTP diffusely anteriorly.  No focal TTP. Negative ant drawer and talar tilt.   Negative syndesmotic compression. Thompsons test negative. NV intact distally.  R knee: FROM without pain or instability.  Pes planus.    Assessment & Plan:  1. Left knee pain - MRI shows some chondromalacia but otherwise normal.  Unfortunately unable to afford PT.  Reviewed basic lower extremity strengthening exercises to do daily.  Sports insoles should help add arch support, prevent overpronation and decrease pain behind patella.  Consider a different oral NSAID though a lot of these decrease the serum concentration of Complera - will discuss with his ID physician.  Do not think surgical intervention would be of benefit to him if he continues to have pain.  F/u prn.  2. Bilateral ankle pain - believe this is mostly gait related from his knee pain.  Shown home exercises.  Tylenol, icing.  Consider radiographs, joint injection if arthritis shown though doubt this is reason for his pain.  Addendum: Discussed with ID regarding NSAIDs and complera - [name] noted it was ok to use voltaren twice a day if we would like to try this.  Tried calling patient at number in chart and this is disconnected, no alternate numbers available.

## 2012-05-28 NOTE — Patient Instructions (Signed)
-  Continue Novolog Insulin 4 units before breakfast and lunch, and 8 units before dinner.  -Keep up the hard work at [employer]!  -If you notice your blood sugars rising to more than 200 frequently, please call the clinic for a sooner appointment.  Please be sure to bring all of your medications with you to every visit.  Should you have any new or worsening symptoms, please be sure to call the clinic at [phone].

## 2012-05-28 NOTE — Patient Instructions (Addendum)
Start the home exercise program for your knees and ankles and do this once a day every day. Use voltaren four times a day - if this isn't working well enough for you we can try a different anti-inflammatory. I don't think injecting your knee again would be of benefit nor would surgical intervention. Arch support should help with all of these issues as well as using well supported shoe. Physical therapy would be the most helpful thing but cost is too much. Follow up with me as needed.

## 2012-05-28 NOTE — Assessment & Plan Note (Signed)
Lab Results  Component Value Date   NA 142 05/13/2012   K 3.7 05/13/2012   CL 104 05/13/2012   CO2 25 05/13/2012   BUN 14 05/13/2012   CREATININE 1.04 05/13/2012   CREATININE 1.08 04/29/2012    BP Readings from Last 3 Encounters:  05/28/12 129/73  05/18/12 105/67  05/13/12 100/64    Assessment: Hypertension control:  controlled  Progress toward goals:  at goal Barriers to meeting goals:  no barriers identified  Plan: Hypertension treatment:  continue current medications - Dilt 240mg , Metoprolol 25mg  daily

## 2012-05-28 NOTE — Assessment & Plan Note (Signed)
Recent relapse d/t environment. Back in rehab at [facility] & doing well.

## 2012-05-28 NOTE — Assessment & Plan Note (Signed)
CBGs significantly improved.  Since hospitalization, he has been taking 4u Novolog QID and no lantus.   Pre breakfast = 74-141 Pre lunch = 93-156 (218 once) Pre dinner = 146-204 Making wise diet choices in cafeteria at [facility].  Plan = Officially d/c lantus (was taking 25 u, may restart if needed) Continue Novolog 4u before breakfast & dinner Increase to Novolog 8u before dinner

## 2012-06-02 ENCOUNTER — Encounter: Payer: Self-pay | Admitting: Internal Medicine

## 2012-06-02 ENCOUNTER — Ambulatory Visit (INDEPENDENT_AMBULATORY_CARE_PROVIDER_SITE_OTHER): Payer: Medicare Other | Admitting: Dietician

## 2012-06-02 ENCOUNTER — Ambulatory Visit (INDEPENDENT_AMBULATORY_CARE_PROVIDER_SITE_OTHER): Payer: Medicare Other | Admitting: Internal Medicine

## 2012-06-02 VITALS — Ht 68.0 in | Wt 173.1 lb

## 2012-06-02 VITALS — BP 118/75 | HR 71 | Temp 98.0°F | Ht 67.0 in | Wt 172.6 lb

## 2012-06-02 DIAGNOSIS — E1149 Type 2 diabetes mellitus with other diabetic neurological complication: Secondary | ICD-10-CM

## 2012-06-02 DIAGNOSIS — E1142 Type 2 diabetes mellitus with diabetic polyneuropathy: Secondary | ICD-10-CM

## 2012-06-02 DIAGNOSIS — E1165 Type 2 diabetes mellitus with hyperglycemia: Secondary | ICD-10-CM

## 2012-06-02 DIAGNOSIS — IMO0002 Reserved for concepts with insufficient information to code with codable children: Secondary | ICD-10-CM

## 2012-06-02 DIAGNOSIS — E1140 Type 2 diabetes mellitus with diabetic neuropathy, unspecified: Secondary | ICD-10-CM

## 2012-06-02 MED ORDER — INSULIN GLARGINE 100 UNIT/ML ~~LOC~~ SOLN: SUBCUTANEOUS | Status: DC

## 2012-06-02 NOTE — Patient Instructions (Addendum)
Please schedule an appointment in October with [facility]. bring meter, insulin and treatment for low blood sugar.   Try to eat about the same amount fo carb at each meal each day.  Can substitute one carb for another of equal amount of carbs:   Example: eat an apple that contain ~ 22 grams carb for 3 chip ahoy cookies that also contain 22 grams carb.                 Or                eat 3 chip ahoy cookies that contain ~ 22 grams carb instead of an apple  that also contain 22 grams carb.

## 2012-06-02 NOTE — Progress Notes (Signed)
Medical Nutrition Therapy:  Appt start time: 0935end time:  1100.  CBG 222 this am ~ 7 AM, took 4 units Novolog, ate ~ 47 grams carb, 4 hour later CBG 177.   Assessment:  Primary concerns today: Blood sugar control.  Usual eating pattern includes 3 meals at treatment center.  Weight is within normal limits. A1C 9%. Now off Lantus, taking metformin and Novolog 4 units with breakfast and lunch, 8 units with dinner. Meter download show pattern of highs fastings and  at bedtime. Intake reported is more moderate in carbohydrates than previous visit. He cannot reads labels due to blurred vision, complains of not being able to remember what he reads and feeling poorly after taking morning meds. (of note has taken only HIV med and Metformin and felt fine.)  He is beginning  to grasp concept that eating moderate portions of carbs consistently assists with blood sugar control and that insulin needs to match carbs eaten.   Progress Towards Goal(s):  In progress.   Nutritional Diagnosis:  NB-1.1 Food and nutrition-related knowledge deficit As related to lack of prior exposure to diabetes meal  Planning, carb consistency and portions sizes of  carbohydrates  As evidenced by his report and large fruit portions and questions about what foods he can and cannot have.    Intervention:  1- Nutrition education about carbohydrate portions, consistency. 2- Nutrition education about interpreting blood glucose data, prevention, detection and treatment of hypoglycemia and insulin types, action and use. 3-  Coordination of care- patient scheduled appointment with doctor to discuss: 1- patient feels blood sugars better controlled on lantus and not happy with fasting blood sugars- desires insulin adjustment, 2- Possible medication side effects   Monitoring/Evaluation:  Dietary intake, exercise, blood glucose testing beore and after dinner, and body weight in 8 week(s).( October)

## 2012-06-02 NOTE — Patient Instructions (Signed)
-  Please start taking 10units of lantus before bed.  -Continue your mealtime insulin as you had been (4 units before lunch and dinner, and then 8 units before supper).  Please be sure to bring all of your medications with you to every visit.  Should you have any new or worsening symptoms, please be sure to call the clinic at [phone].

## 2012-06-02 NOTE — Progress Notes (Signed)
Subjective:   Patient ID: Michael [name] male   DOB: 06-Jan-1951 61 y.o.   MRN: [id number]  HPI: [name] is a 61 y.o. man with history of diabetes, hypertension, HIV infection, and history of cocaine abuse who presents today for an acute visit. Patient was just seen in clinic last week on 05/28/2012 for routine followup of his diabetes.  Earlier today, patient had an appointment with our dietitian, and complained of headaches, fatigue, nervousness that began  a few days ago.  He reports improved appetite, and subsequently his CBGs have increased.  He is concerned about his hyperglycemia.    Past Medical History  Diagnosis Date  . Diabetes mellitus   . Hypertension   . Angina   . Shortness of breath   . Headache   . Gout   . Hyperlipidemia   . HIV infection   . Crack cocaine use    Current Outpatient Prescriptions  Medication Sig Dispense Refill  . allopurinol (ZYLOPRIM) 100 MG tablet Take 200 mg by mouth every morning. For gout.      Marland Kitchen aspirin 81 MG chewable tablet Chew 81 mg by mouth daily. For blood thinner.      . diltiazem (CARDIZEM CD) 240 MG 24 hr capsule Take 240 mg by mouth daily.      . DULoxetine (CYMBALTA) 60 MG capsule Take 60 mg by mouth daily.      . Emtricitab-Rilpivir-Tenofovir (COMPLERA) 200-25-300 MG TABS Take 1 tablet by mouth daily.      Marland Kitchen gabapentin (NEURONTIN) 100 MG capsule Take 1 capsule (100 mg total) by mouth 3 (three) times daily. For pain & anxiety.  90 capsule  0  . insulin aspart (NOVOLOG) 100 UNIT/ML injection Inject 4units before breakfast & lunch; Inject 8units before dinner  30 mL  0  . metFORMIN (GLUCOPHAGE) 1000 MG tablet Take 1,000 mg by mouth 2 (two) times daily with a meal.      . metoprolol succinate (TOPROL-XL) 25 MG 24 hr tablet Take 25 mg by mouth daily.      Marland Kitchen PARoxetine (PAXIL) 20 MG tablet Take 20 mg by mouth every morning.      . pravastatin (PRAVACHOL) 40 MG tablet Take 40 mg by mouth every evening.       Family History    Problem Relation Age of Onset  . Diabetes Mother   . Diabetes Father   . Diabetes Brother    History   Social History  . Marital Status: Single    Spouse Name: N/A    Number of Children: N/A  . Years of Education: N/A   Social History Main Topics  . Smoking status: Never Smoker   . Smokeless tobacco: Never Used  . Alcohol Use: No  . Drug Use: Yes    Special: "Crack" cocaine, Cocaine  . Sexually Active: No     accepted condoms   Other Topics Concern  . None   Social History Narrative  . None   Review of Systems: General: no fevers, chills, changes in weight Skin: no rash HEENT: no blurry vision, hearing changes, sore throat Pulm: no dyspnea, coughing, wheezing CV: no chest pain, palpitations, shortness of breath Abd: no abdominal pain, nausea/vomiting, diarrhea/constipation GU: no dysuria, hematuria, polyuria Ext: no arthralgias, myalgias Neuro: no weakness, numbness, or tingling   Objective:  Physical Exam: Filed Vitals:   06/02/12 1413  BP: 118/75  Pulse: 71  Temp: 98 F (36.7 C)  TempSrc: Oral  Height: 5\' 7"  (1.702  m)  Weight: 172 lb 9.6 oz (78.291 kg)   Constitutional: Vital signs reviewed.  Patient is a well-developed and well-nourished man in no acute distress and cooperative with exam. Mouth: no erythema or exudates, MMM Eyes: PERRL, EOMI, conjunctivae normal, No scleral icterus.  Cardiovascular: RRR, S1 normal, S2 normal, no MRG, pulses symmetric and intact bilaterally Pulmonary/Chest: CTAB, no wheezes, rales, or rhonchi Abdominal: Soft. Non-tender, non-distended, bowel sounds are normal, no masses, organomegaly, or guarding present.  Neurological: A&O x3 Skin: Warm, dry and intact. No rash, cyanosis, or clubbing.  Psychiatric: Appears depressed. Judgment and thought content normal. Cognition and memory are normal.   Assessment & Plan:  Case and care discussed with Dr. Cathren Laine & Butch Penny Plyler. Patient to return in 3 months, or sooner if needed.  Please see problem-oriented charting for further details.

## 2012-06-02 NOTE — Assessment & Plan Note (Signed)
Review of blood sugars reveal hyperglycemia before dinner and in the mornings. Restart Lantus 10 units before bed. Continue NovoLog 4 units before breakfast and lunch and 8 units before dinner.  -Return to clinic in 3 months, sooner if symptoms do not resolve. -I also discussed his presentation with his case manager, Maudie Mercury from [place] [phone])- I am concerned that there might be another component to his symptoms, he may be related to his psych medications, so I asked that she convey message to the counselors in rehabilitation to keep an eye on him as well

## 2012-06-14 NOTE — Discharge Summary (Signed)
Physician Discharge Summary Note  Patient:  Michael [name] is an 61 y.o., male MRN:  [id number] DOB:  1950-12-10 Patient phone:  [phone] (home)  Patient address:   [address] [place] [address]   Date of Admission:  05/13/2012  Reason for Admission: see H&P.  Discharge Diagnoses: AXIS I: Cocaine Dependence; Depressive Disorder NOS  AXIS II: Deferred  AXIS III:  Past Medical History   Diagnosis  Date   .  Diabetes mellitus    .  Hypertension    .  Angina    .  Shortness of breath    .  Headache    .  Gout    .  Hyperlipidemia    .  HIV infection    .  Crack cocaine use    AXIS IV: Moderate  AXIS V: 50  Level of Care:  [facility]  Hospital Course:  Pt admitted for crisis stabilization, detox and treatment. Pt attended all therapeutic groups, was active in his treatment planning process and agreed the current medication regimen for further stability during recovery.  There were no acute issues during treatment and he was open to further substance abuse Tx.  All labs were reviewed in great detail and medical/psychiatric needs were addressed.  No acute safety issues were noted on the unit. Medications were reviewed with pt and medication education was provided. Mental health treatment, medication management and continued sobriety will mitigate against any potential increased risk of harm to self and/or others.  Discussed the importance of recovery with pt, as well as, tools to move forward in a healthy & safe manner using the 12 Step Process.    Consults:  None.  Significant Diagnostic Studies:  See labs.  Discharge Vitals:   Blood pressure 105/67, pulse 82, temperature 97 F (36.1 C), temperature source Oral, resp. rate 16, height 5\' 6"  (1.676 m), weight 71.668 kg (158 lb).  Physical Findings: AIMS: Facial and Oral Movements Muscles of Facial Expression: None, normal Lips and Perioral Area: None, normal Jaw: None, normal Tongue: None, normal,Extremity  Movements Upper (arms, wrists, hands, fingers): None, normal Lower (legs, knees, ankles, toes): None, normal, Trunk Movements Neck, shoulders, hips: None, normal, Overall Severity Severity of abnormal movements (highest score from questions above): None, normal Incapacitation due to abnormal movements: None, normal Patient's awareness of abnormal movements (rate only patient's report): No Awareness, Dental Status Current problems with teeth and/or dentures?: No Does patient usually wear dentures?: No  CIWA:  CIWA-Ar Total: 0  COWS:  COWS Total Score: 2   Mental Status Exam: See Mental Status Examination and Suicide Risk Assessment completed by Attending Physician prior to discharge.  Discharge destination:  [facility]  Is patient on multiple antipsychotic therapies at discharge:  No   Has Patient had three or more failed trials of antipsychotic monotherapy by history:  No  Recommended Plan for Multiple Antipsychotic Therapies: NA  Discharge Orders    Future Appointments: Provider: Department: Dept Phone: Center:   07/27/2012 9:30 AM [facility] [phone] [facility]   08/10/2012 10:45 AM [name], MD [facility] [phone] [facility]   08/26/2012 2:15 PM Othella Boyer, MD [facility] [phone] [facility]     Future Orders Please Complete By Expires   Diet - low sodium heart healthy        Medication List  As of 06/14/2012  8:30 PM   STOP taking these medications  diclofenac sodium 1 % Gel      etodolac 500 MG tablet      hydrOXYzine 50 MG tablet      meloxicam 15 MG tablet         TAKE these medications      Indication    allopurinol 100 MG tablet   Commonly known as: ZYLOPRIM   Take 200 mg by mouth every morning. For gout.       aspirin 81 MG chewable tablet   Chew 81 mg by mouth daily. For blood thinner.       COMPLERA 200-25-300 MG Tabs   Generic drug: Emtricitab-Rilpivir-Tenofovir   Take 1 tablet by mouth  daily.       diltiazem 240 MG 24 hr capsule   Commonly known as: CARDIZEM CD   Take 240 mg by mouth daily.       DULoxetine 60 MG capsule   Commonly known as: CYMBALTA   Take 60 mg by mouth daily.       gabapentin 100 MG capsule   Commonly known as: NEURONTIN   Take 1 capsule (100 mg total) by mouth 3 (three) times daily. For pain & anxiety.       metFORMIN 1000 MG tablet   Commonly known as: GLUCOPHAGE   Take 1,000 mg by mouth 2 (two) times daily with a meal.       metoprolol succinate 25 MG 24 hr tablet   Commonly known as: TOPROL-XL   Take 25 mg by mouth daily.       PARoxetine 20 MG tablet   Commonly known as: PAXIL   Take 20 mg by mouth every morning.       pravastatin 40 MG tablet   Commonly known as: PRAVACHOL   Take 40 mg by mouth every evening.            Follow-up Information    Follow up with [facility] on 05/19/2012. (8 AM sharp with Timmothy Sours)    Contact information:   [facility] [id number]         Plan Of Care/Follow-up recommendations: Pt seen and evaluated in treatment team. Chart reviewed. Pt stable for and requesting discharge in am to [facility]. Pt contracting for safety and does not currently meet [facility] involuntary commitment criteria for continued hospitalization against his will. Mental health treatment, medication management and continued sobriety will mitigate against the potential increased risk of harm to self and/or others. Discussed the importance of recovery further with pt, as well as, tools to move forward in a healthy & safe manner. Pt agreeable with the plan. Discussed with the team.  Recommend follow up with NA. Diet: Heart Healthy/Diabetic. Activity: As tolerated.    Signed: Cheryll Cockayne 06/14/2012, 8:30 PM

## 2012-06-19 ENCOUNTER — Encounter: Payer: Self-pay | Admitting: *Deleted

## 2012-06-19 NOTE — Progress Notes (Signed)
BC closed patient's file for bridge counseling today. Patient is in medical care and doing well. Patient is living with his brother in [facility] and was given the contact information for [facility] should he need additional CM services.

## 2012-06-23 ENCOUNTER — Other Ambulatory Visit: Payer: Self-pay | Admitting: *Deleted

## 2012-06-23 NOTE — Telephone Encounter (Signed)
Pt called stating he needs some help with his performance.  He wants to try Medicine for ED.  Please send to his pharmacy.

## 2012-06-24 NOTE — Telephone Encounter (Signed)
Will have to discuss this at his next follow up, as he has never discussed this with me in the past.

## 2012-06-25 NOTE — Telephone Encounter (Signed)
Pt informed and will schedule an appointment

## 2012-07-07 ENCOUNTER — Encounter: Payer: Self-pay | Admitting: Internal Medicine

## 2012-07-07 ENCOUNTER — Ambulatory Visit (INDEPENDENT_AMBULATORY_CARE_PROVIDER_SITE_OTHER): Payer: Medicare Other | Admitting: Internal Medicine

## 2012-07-07 VITALS — BP 138/90 | HR 75 | Temp 96.8°F | Ht 67.5 in | Wt 171.3 lb

## 2012-07-07 DIAGNOSIS — E1140 Type 2 diabetes mellitus with diabetic neuropathy, unspecified: Secondary | ICD-10-CM

## 2012-07-07 DIAGNOSIS — I10 Essential (primary) hypertension: Secondary | ICD-10-CM

## 2012-07-07 DIAGNOSIS — Z23 Encounter for immunization: Secondary | ICD-10-CM

## 2012-07-07 DIAGNOSIS — N529 Male erectile dysfunction, unspecified: Secondary | ICD-10-CM

## 2012-07-07 DIAGNOSIS — Z299 Encounter for prophylactic measures, unspecified: Secondary | ICD-10-CM

## 2012-07-07 DIAGNOSIS — E1142 Type 2 diabetes mellitus with diabetic polyneuropathy: Secondary | ICD-10-CM

## 2012-07-07 DIAGNOSIS — IMO0002 Reserved for concepts with insufficient information to code with codable children: Secondary | ICD-10-CM

## 2012-07-07 DIAGNOSIS — E1149 Type 2 diabetes mellitus with other diabetic neurological complication: Secondary | ICD-10-CM

## 2012-07-07 MED ORDER — LISINOPRIL 5 MG PO TABS: 5.0000 mg | ORAL_TABLET | Freq: Every day | ORAL | Status: DC

## 2012-07-07 MED ORDER — SILDENAFIL CITRATE 25 MG PO TABS: 25.0000 mg | ORAL_TABLET | ORAL | Status: DC | PRN

## 2012-07-07 NOTE — Patient Instructions (Addendum)
-  Keep taking insulin as you have been (Lantus 25u before bed, and Aspart (meal time) 4units before breakfast & 10units before supper)  -Start taking Lisinopril 5mg  daily  -You may take viagra 25mg  as needed  -We will try to get your records from Dr. Doristine Johns  Please be sure to bring all of your medications with you to every visit.  Should you have any new or worsening symptoms, please be sure to call the clinic at [phone].

## 2012-07-07 NOTE — Assessment & Plan Note (Signed)
Most likely related to DM, but apparently started 3 years prior after some sort of procedure.  Difficulty with erection- cannot become fully erect with masturbation or during sexual circumstance. Apparently procedure was done by a Dr. Doristine Johns in [facility] [phone] because he was having start and stop urine. He denies h/o prostate Ca.    -Was supposed to have PSA drawn today, but patient left before could be drawn.  He will follow up in 1 month for DM, so we can draw it then.  I will cancel today's order. -Trial of Viagra -Request records from above phone number

## 2012-07-07 NOTE — Assessment & Plan Note (Signed)
CBGs improved (102-166) with Lantus 25u qHS and Aspart 4u qBreakfast and 10u qSupper.  Continue current regimen.  Recheck A1c next month.  Start ACEI today given proteinuria. Continue pravastatin 40 daily (last LDL = 50 seven months prior)

## 2012-07-07 NOTE — Assessment & Plan Note (Signed)
Lab Results  Component Value Date   NA 142 05/13/2012   K 3.7 05/13/2012   CL 104 05/13/2012   CO2 25 05/13/2012   BUN 14 05/13/2012   CREATININE 1.04 05/13/2012   CREATININE 1.08 04/29/2012    BP Readings from Last 3 Encounters:  07/07/12 138/90  06/02/12 118/75  05/28/12 117/77    Assessment: Hypertension control:  controlled  Progress toward goals:  at goal Barriers to meeting goals:  no barriers identified  Plan: Hypertension treatment:  continue current medications - diltiazem, metoprolol; will also start Lisinopril 5mg  daily as urine microalb/cr was elevated - recheck BMET in 1 month

## 2012-07-07 NOTE — Progress Notes (Signed)
Subjective:   Patient ID: Michael [name] male   DOB: September 03, 1951 61 y.o.   MRN: [id number]  HPI: [name] is a 61 y.o. man with history of diabetes, hypertension, HIV infection, and history of cocaine abuse who presents today DM f/u. Patient was just seen in clinic last week on 05/28/2012 for routine followup of his diabetes.  Since last visit, he is doing well regarding polysubstance abuse, and his bridge counselor has signed off. He is living with his brother in [facility] and has contact info if for [facility] if he needs help.  DM: Checks CBGs every morning before breakfast. Glucometer downloaded and reviewed.  Range 102-166.  Takes Lantus 25u qHS and Aspart 4u morning before breakfast and 10u before supper.  Eats a lot of fruits, sandwhiches. Staying away from desserts, except occasional ice cream at farmer's market with brother.  Started walking more, and notes left knee is improved (uses gel, no more cain).    C/o ED with both masturbation & during sexual intercourse. Has noticed problem recently - involved with a male partner who knows his HIV status and he uses condoms.    Past Medical History  Diagnosis Date  . Diabetes mellitus   . Hypertension   . Angina   . Shortness of breath   . Headache   . Gout   . Hyperlipidemia   . HIV infection   . Crack cocaine use    Current Outpatient Prescriptions  Medication Sig Dispense Refill  . allopurinol (ZYLOPRIM) 100 MG tablet Take 200 mg by mouth every morning. For gout.      Marland Kitchen aspirin 81 MG chewable tablet Chew 81 mg by mouth daily. For blood thinner.      . diltiazem (CARDIZEM CD) 240 MG 24 hr capsule Take 240 mg by mouth daily.      . DULoxetine (CYMBALTA) 60 MG capsule Take 60 mg by mouth daily.      . Emtricitab-Rilpivir-Tenofovir (COMPLERA) 200-25-300 MG TABS Take 1 tablet by mouth daily.      Marland Kitchen gabapentin (NEURONTIN) 100 MG capsule Take 1 capsule (100 mg total) by mouth 3 (three) times daily. For pain & anxiety.  90  capsule  0  . insulin aspart (NOVOLOG) 100 UNIT/ML injection Inject 4units before breakfast & lunch; Inject 8units before dinner  30 mL  0  . insulin glargine (LANTUS) 100 UNIT/ML injection Inject 10u daily before bed  10 mL  3  . metFORMIN (GLUCOPHAGE) 1000 MG tablet Take 1,000 mg by mouth 2 (two) times daily with a meal.      . metoprolol succinate (TOPROL-XL) 25 MG 24 hr tablet Take 25 mg by mouth daily.      Marland Kitchen PARoxetine (PAXIL) 20 MG tablet Take 20 mg by mouth every morning.      . pravastatin (PRAVACHOL) 40 MG tablet Take 40 mg by mouth every evening.       Family History  Problem Relation Age of Onset  . Diabetes Mother   . Diabetes Father   . Diabetes Brother    History   Social History  . Marital Status: Single    Spouse Name: N/A    Number of Children: N/A  . Years of Education: N/A   Social History Main Topics  . Smoking status: Never Smoker   . Smokeless tobacco: Never Used  . Alcohol Use: No  . Drug Use: Yes    Special: "Crack" cocaine, Cocaine  . Sexually Active: No  accepted condoms   Other Topics Concern  . None   Social History Narrative  . None   Review of Systems: Constitutional: Denies fever, chills, diaphoresis, appetite change and fatigue.  HEENT: Denies photophobia, eye pain, redness, hearing loss, ear pain, congestion, sore throat, rhinorrhea, sneezing, mouth sores, trouble swallowing, neck pain, neck stiffness and tinnitus.   Respiratory: Denies SOB, DOE, cough, chest tightness,  and wheezing.   Cardiovascular: Denies chest pain, palpitations and leg swelling.  Gastrointestinal: Denies nausea, vomiting, abdominal pain, diarrhea, constipation, blood in stool and abdominal distention.  Genitourinary: Denies dysuria, urgency, frequency, hematuria, flank pain and difficulty urinating.  Musculoskeletal: Denies myalgias, back pain, joint swelling, arthralgias and gait problem.  Skin: Denies pallor, rash and wound.  Neurological: Denies dizziness,  seizures, syncope, weakness, light-headedness, numbness and headaches.  Psychiatric/Behavioral: Denies suicidal ideation, mood changes, confusion, nervousness, sleep disturbance and agitation  Objective:  Physical Exam: Filed Vitals:   07/07/12 0921  BP: 138/90  Pulse: 75  Temp: 96.8 F (36 C)  TempSrc: Oral  Height: 5' 7.5" (1.715 m)  Weight: 171 lb 4.8 oz (77.701 kg)  SpO2: 100%   Constitutional: Vital signs reviewed.  Patient is a well-developed and well-nourished man in no acute distress and cooperative with exam.  Mouth: no erythema or exudates, MMM, poor dentition Eyes: PERRL, EOMI, conjunctivae normal, No scleral icterus.  Neck: Supple, Trachea midline normal ROM, No JVD, mass, thyromegaly, or carotid bruit present.  Cardiovascular: RRR, S1 normal, S2 normal, no MRG, pulses symmetric and intact bilaterally Pulmonary/Chest: CTAB, no wheezes, rales, or rhonchi Abdominal: Soft. Non-tender, non-distended, bowel sounds are normal, no masses, organomegaly, or guarding present.  Musculoskeletal: No joint deformities, erythema, or stiffness, ROM full and no nontender  Neurological: A&O x3, Strength is normal and symmetric bilaterally, cranial nerve II-XII are grossly intact, no focal motor deficit Skin: Warm, dry and intact. No rash, cyanosis, or clubbing.  Psychiatric: Normal mood and affect. speech and behavior is normal. Judgment and thought content normal. Cognition and memory are normal.   Assessment & Plan:  Case and care discussed with Dr. Murlean Caller. Please see problem oriented charting for further details. Patient to return in 1 month for A1c f/u.

## 2012-07-27 ENCOUNTER — Other Ambulatory Visit: Payer: Medicare Other

## 2012-07-27 DIAGNOSIS — B20 Human immunodeficiency virus [HIV] disease: Secondary | ICD-10-CM

## 2012-07-27 LAB — CBC
MCH: 27.1 pg (ref 26.0–34.0)
MCHC: 33.5 g/dL (ref 30.0–36.0)
MCV: 80.8 fL (ref 78.0–100.0)
Platelets: 192 10*3/uL (ref 150–400)
RBC: 4.73 MIL/uL (ref 4.22–5.81)

## 2012-07-27 LAB — COMPLETE METABOLIC PANEL WITH GFR
ALT: 12 U/L (ref 0–53)
Alkaline Phosphatase: 55 U/L (ref 39–117)
CO2: 26 mEq/L (ref 19–32)
Creat: 1.06 mg/dL (ref 0.50–1.35)
GFR, Est African American: 87 mL/min
GFR, Est Non African American: 75 mL/min
Glucose, Bld: 99 mg/dL (ref 70–99)
Sodium: 145 mEq/L (ref 135–145)
Total Bilirubin: 0.3 mg/dL (ref 0.3–1.2)
Total Protein: 6.6 g/dL (ref 6.0–8.3)

## 2012-07-28 LAB — T-HELPER CELL (CD4) - (RCID CLINIC ONLY)
CD4 % Helper T Cell: 36 % (ref 33–55)
CD4 T Cell Abs: 360 uL — ABNORMAL LOW (ref 400–2700)

## 2012-07-29 LAB — HIV-1 RNA QUANT-NO REFLEX-BLD: HIV 1 RNA Quant: <20 copies/mL (ref <20)

## 2012-08-05 ENCOUNTER — Other Ambulatory Visit: Payer: Self-pay | Admitting: Internal Medicine

## 2012-08-10 ENCOUNTER — Ambulatory Visit: Payer: Self-pay | Admitting: Infectious Diseases

## 2012-08-10 ENCOUNTER — Telehealth: Payer: Self-pay | Admitting: *Deleted

## 2012-08-10 NOTE — Telephone Encounter (Signed)
Called patient to reschedule appt, he no showed today.  No working phone number Myrtis Hopping

## 2012-08-26 ENCOUNTER — Encounter: Payer: Self-pay | Admitting: Internal Medicine

## 2012-08-26 ENCOUNTER — Other Ambulatory Visit: Payer: Self-pay | Admitting: Infectious Diseases

## 2012-09-24 IMAGING — CR DG CHEST 2V
2 series · 2 of 2 positions shown · non-contrast
Comparison: 06/29/2009 CT

CLINICAL DATA: Chest pain

CHEST - 2 VIEW

[w chest pa]
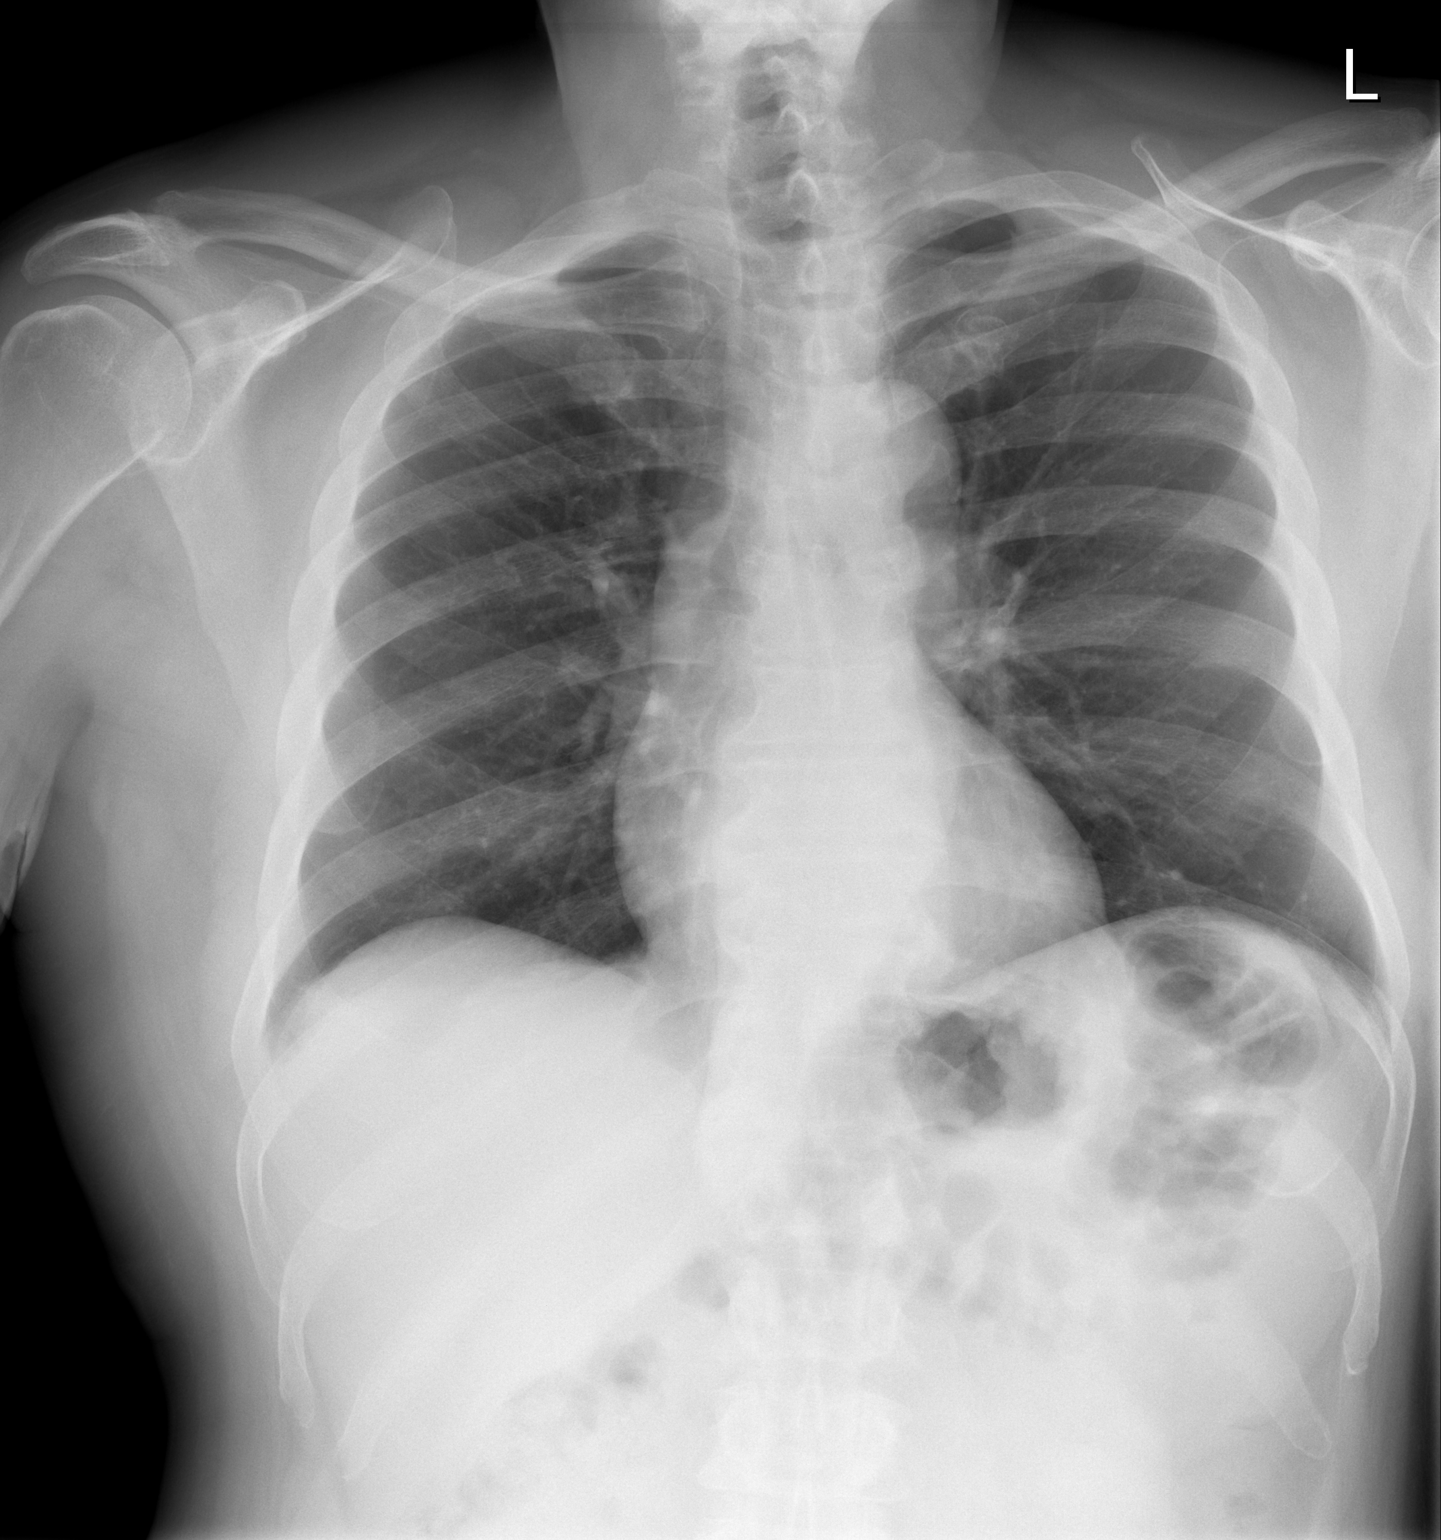

[w chest lat]
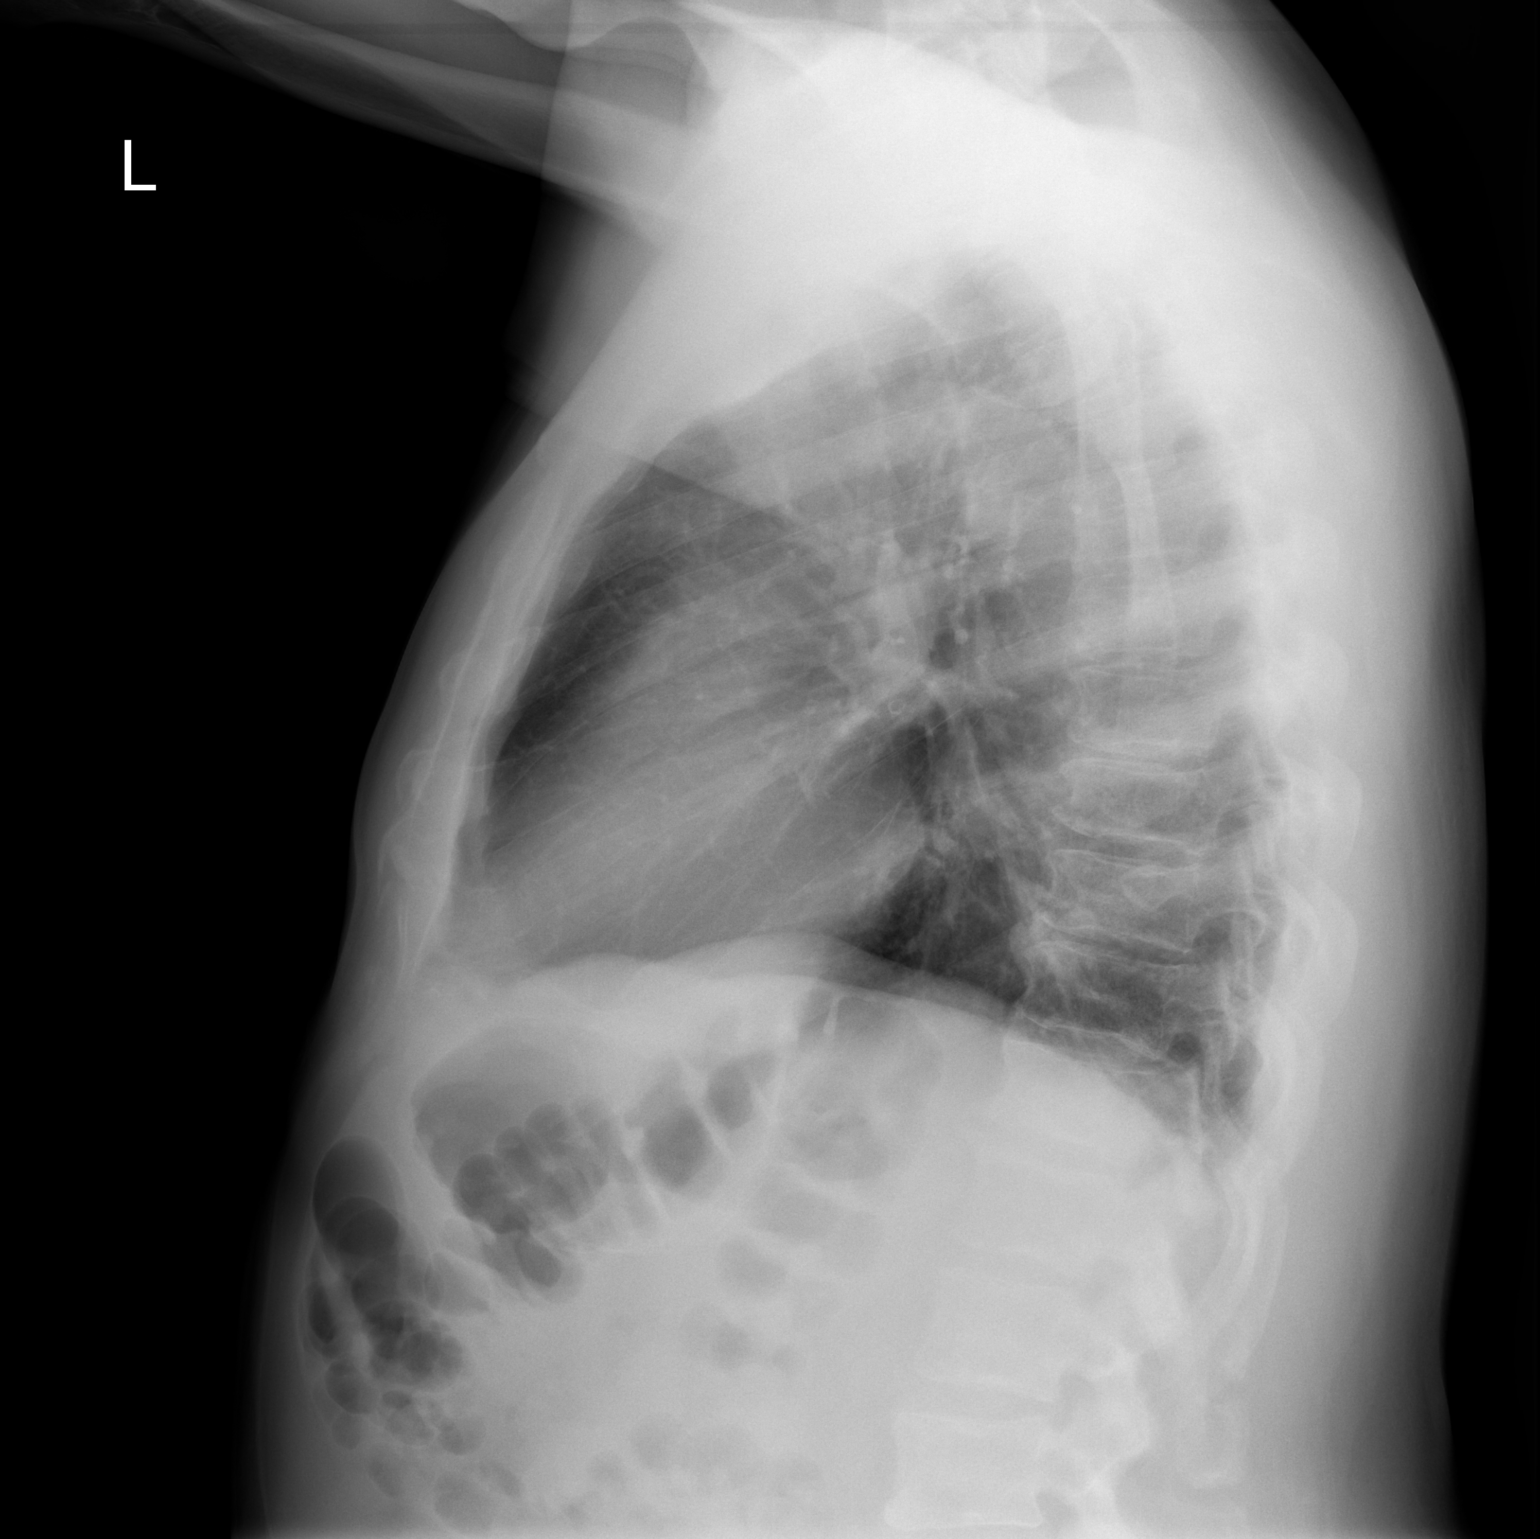

[2 of 2 positions shown; findings below may reference images not displayed]

FINDINGS: Mildly tortuous thoracic aorta.  Lungs are clear.  No
pleural effusion or pneumothorax.  Multilevel degenerative changes.
IMPRESSION: No acute cardiopulmonary process identified.

## 2012-09-24 IMAGING — CT CT HEAD W/O CM
1 series · 16 of 30 positions shown, 20 images · non-contrast
Comparison: None.

CLINICAL DATA: Left eye droop/vision changes.

CT HEAD WITHOUT CONTRAST
TECHNIQUE: Contiguous axial images were obtained from the base of
the skull through the vertex without contrast.

[Series 2: head 4.8 h37s · axial · 0.45mm/px · z∈[+992,+1128]mm · 16 of 32 slices shown, 20 images]
[im 2/32  brain]
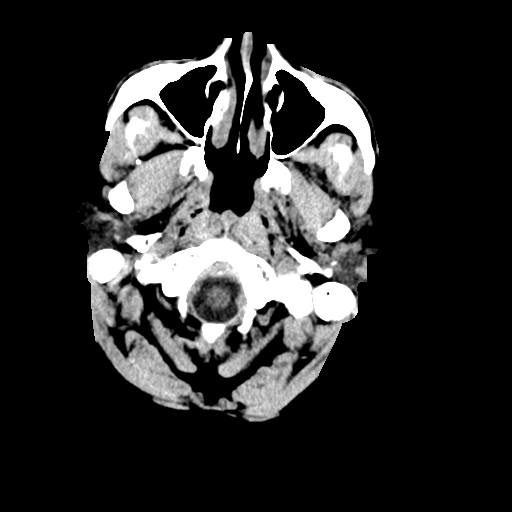
[im 2/32  bone]
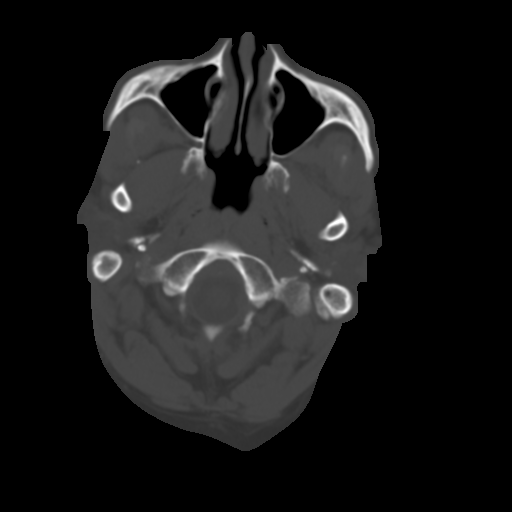
[im 4/32  brain]
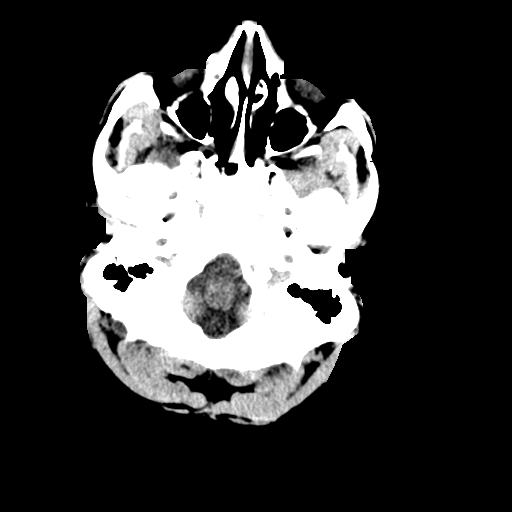
[im 6/32  brain]
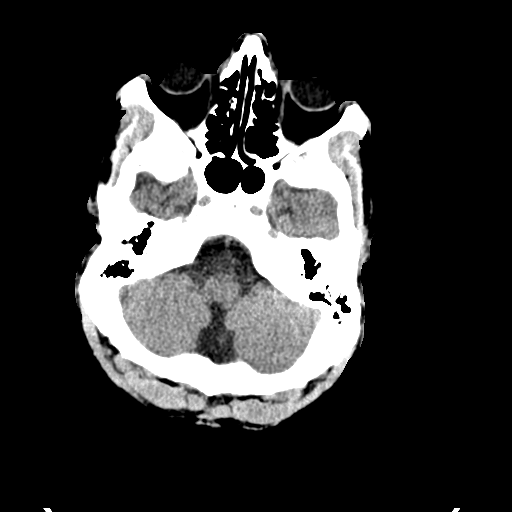
[im 8/32  brain]
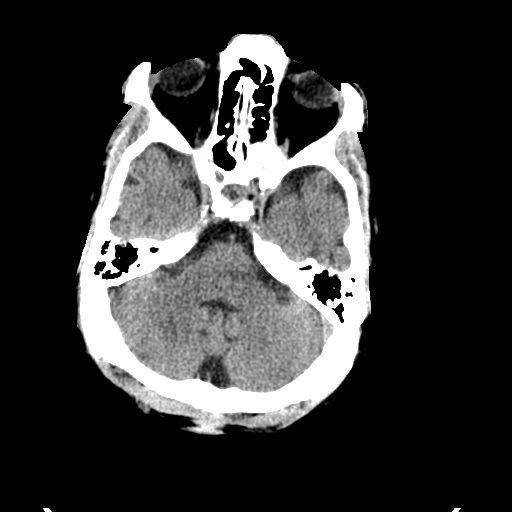
[im 9/32  brain]
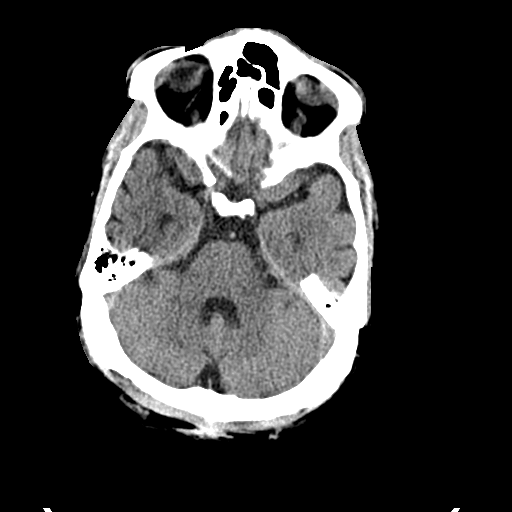
[im 9/32  bone]
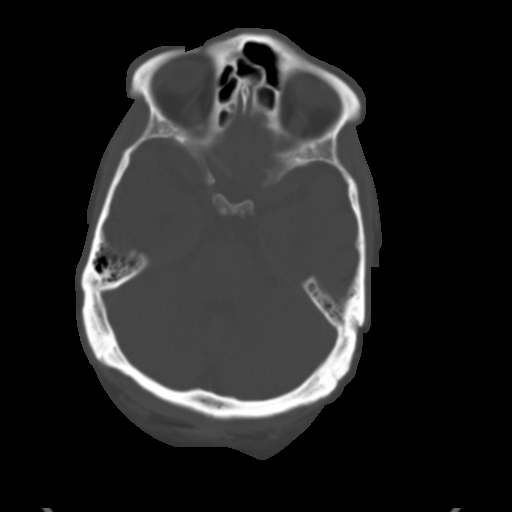
[im 11/32  brain]
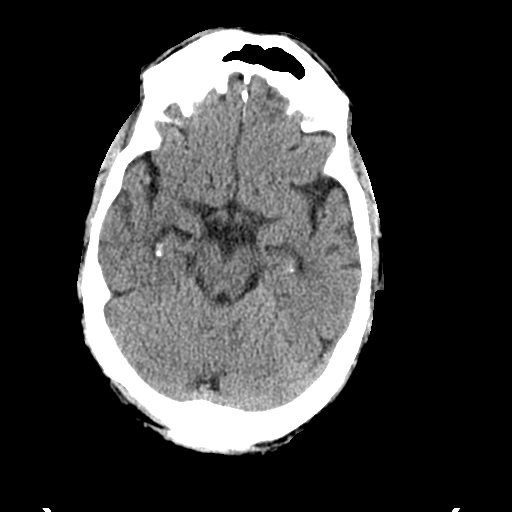
[im 13/32  brain]
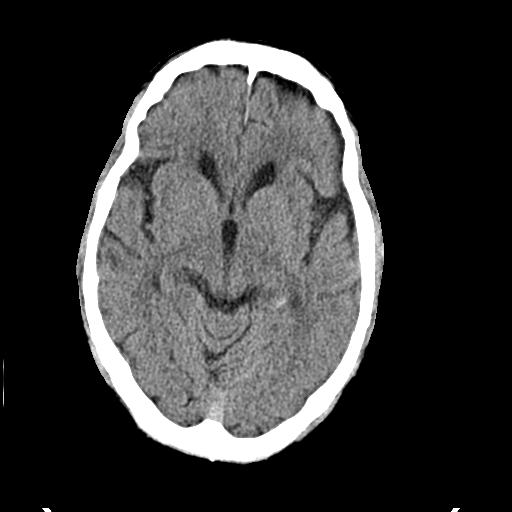
[im 15/32  brain]
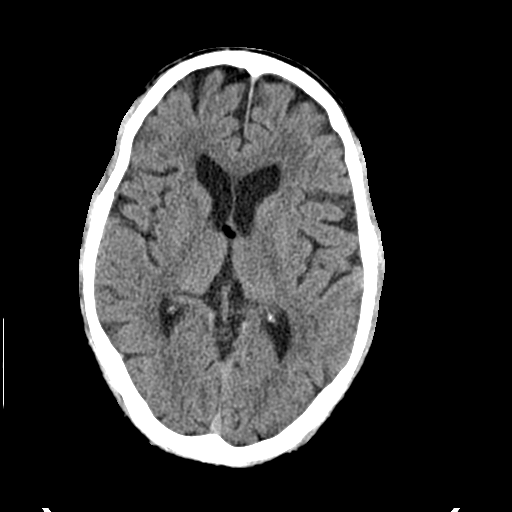
[im 17/32  brain]
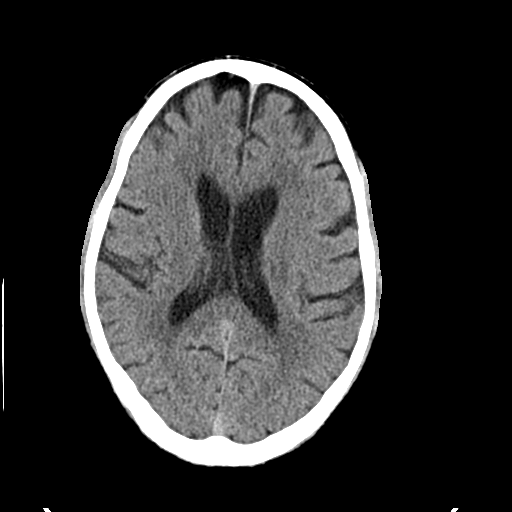
[im 17/32  bone]
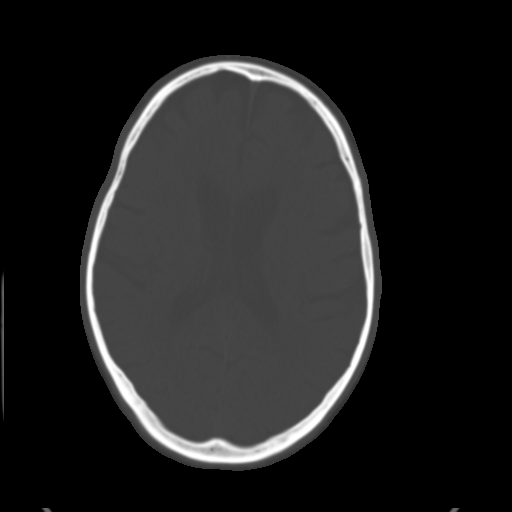
[im 19/32  brain]
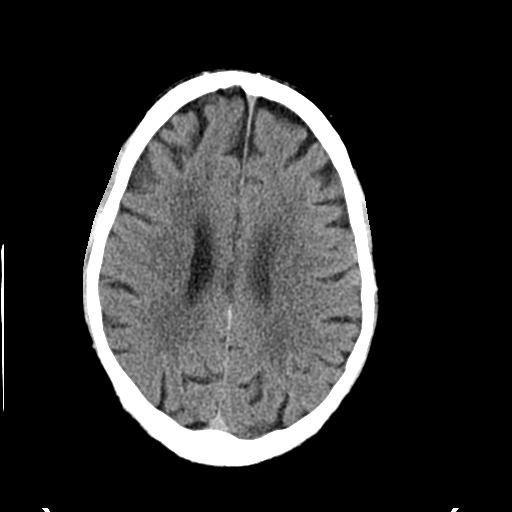
[im 21/32  brain]
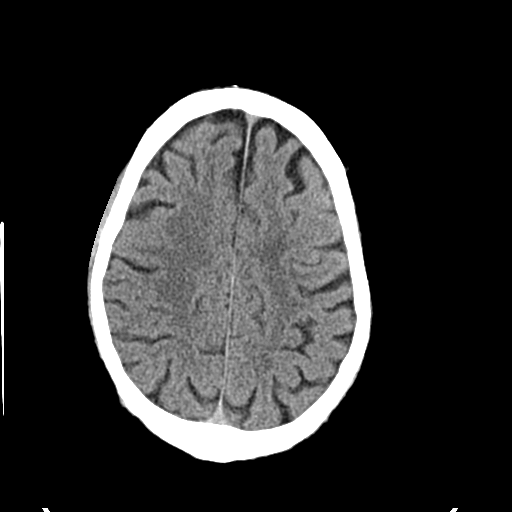
[im 23/32  brain]
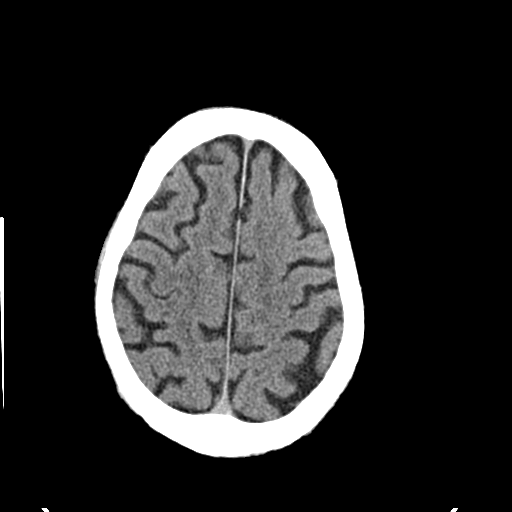
[im 24/32  brain]
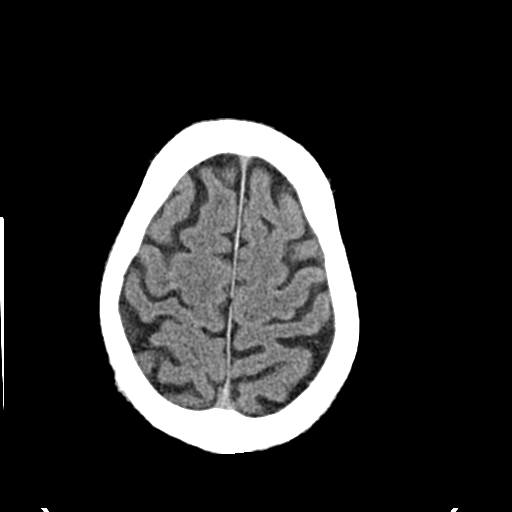
[im 24/32  bone]
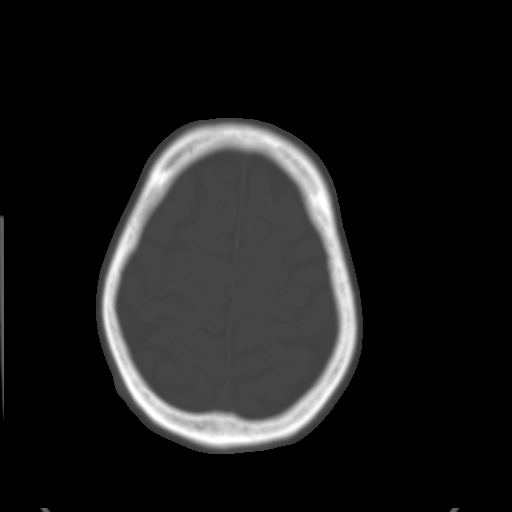
[im 26/32  brain]
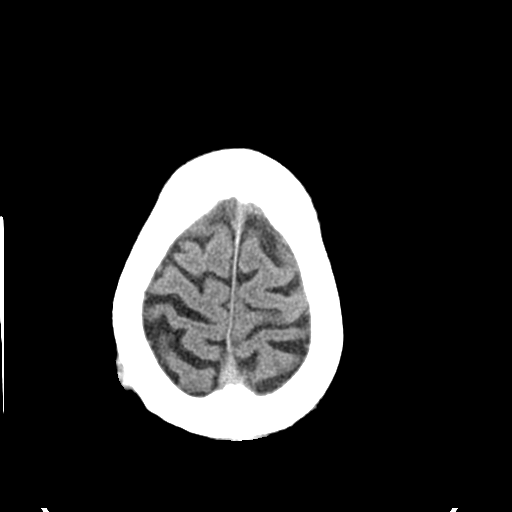
[im 28/32  brain]
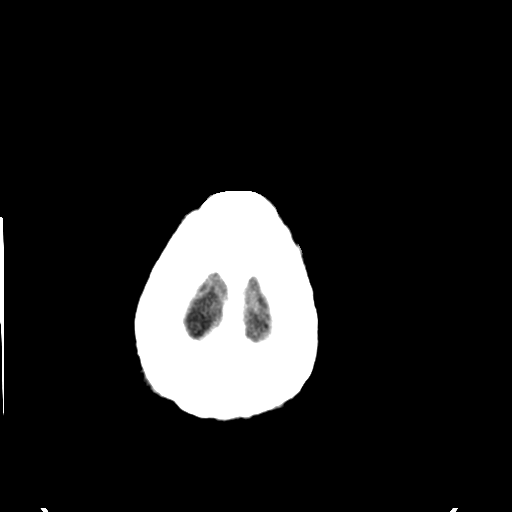
[im 30/32  brain]
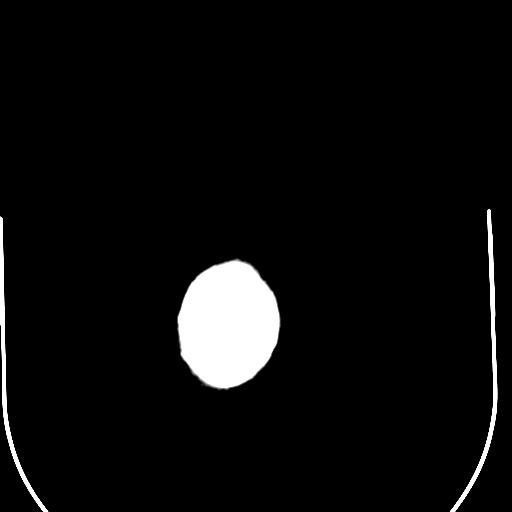

[16 of 30 positions shown; findings below may reference images not displayed]

FINDINGS: Periventricular and subcortical white matter
hypodensities are most in keeping with chronic microangiopathic
change. Focus of midline fat attenuation.  There is no evidence for
acute hemorrhage, hydrocephalus, mass lesion, or abnormal extra-
axial fluid collection.  No definite CT evidence for acute
infarction.  The visualized paranasal sinuses and mastoid air cells
are predominately clear.
IMPRESSION: No acute intracranial abnormality identified.

## 2012-10-04 ENCOUNTER — Emergency Department (HOSPITAL_BASED_OUTPATIENT_CLINIC_OR_DEPARTMENT_OTHER)
Admission: EM | Admit: 2012-10-04 | Discharge: 2012-10-04 | Disposition: A | Discharge status: Home / Self Care | Payer: Medicare Other | Attending: Emergency Medicine | Admitting: Emergency Medicine

## 2012-10-04 ENCOUNTER — Encounter (HOSPITAL_BASED_OUTPATIENT_CLINIC_OR_DEPARTMENT_OTHER): Payer: Self-pay | Admitting: *Deleted

## 2012-10-04 ENCOUNTER — Emergency Department (HOSPITAL_BASED_OUTPATIENT_CLINIC_OR_DEPARTMENT_OTHER): Payer: Medicare Other

## 2012-10-04 DIAGNOSIS — R197 Diarrhea, unspecified: Secondary | ICD-10-CM | POA: Insufficient documentation

## 2012-10-04 DIAGNOSIS — Z794 Long term (current) use of insulin: Secondary | ICD-10-CM | POA: Insufficient documentation

## 2012-10-04 DIAGNOSIS — R059 Cough, unspecified: Secondary | ICD-10-CM

## 2012-10-04 DIAGNOSIS — E785 Hyperlipidemia, unspecified: Secondary | ICD-10-CM | POA: Insufficient documentation

## 2012-10-04 DIAGNOSIS — R05 Cough: Secondary | ICD-10-CM

## 2012-10-04 DIAGNOSIS — J069 Acute upper respiratory infection, unspecified: Secondary | ICD-10-CM

## 2012-10-04 DIAGNOSIS — Z7982 Long term (current) use of aspirin: Secondary | ICD-10-CM | POA: Insufficient documentation

## 2012-10-04 DIAGNOSIS — M109 Gout, unspecified: Secondary | ICD-10-CM | POA: Insufficient documentation

## 2012-10-04 DIAGNOSIS — E119 Type 2 diabetes mellitus without complications: Secondary | ICD-10-CM | POA: Insufficient documentation

## 2012-10-04 DIAGNOSIS — I10 Essential (primary) hypertension: Secondary | ICD-10-CM | POA: Insufficient documentation

## 2012-10-04 DIAGNOSIS — R0982 Postnasal drip: Secondary | ICD-10-CM | POA: Insufficient documentation

## 2012-10-04 DIAGNOSIS — J3489 Other specified disorders of nose and nasal sinuses: Secondary | ICD-10-CM | POA: Insufficient documentation

## 2012-10-04 DIAGNOSIS — R51 Headache: Secondary | ICD-10-CM | POA: Insufficient documentation

## 2012-10-04 DIAGNOSIS — Z79899 Other long term (current) drug therapy: Secondary | ICD-10-CM | POA: Insufficient documentation

## 2012-10-04 DIAGNOSIS — M2550 Pain in unspecified joint: Secondary | ICD-10-CM | POA: Insufficient documentation

## 2012-10-04 DIAGNOSIS — R5383 Other fatigue: Secondary | ICD-10-CM | POA: Insufficient documentation

## 2012-10-04 DIAGNOSIS — R5381 Other malaise: Secondary | ICD-10-CM | POA: Insufficient documentation

## 2012-10-04 DIAGNOSIS — I209 Angina pectoris, unspecified: Secondary | ICD-10-CM | POA: Insufficient documentation

## 2012-10-04 DIAGNOSIS — F1410 Cocaine abuse, uncomplicated: Secondary | ICD-10-CM | POA: Insufficient documentation

## 2012-10-04 LAB — GLUCOSE, CAPILLARY: Glucose-Capillary: 75 mg/dL (ref 70–99)

## 2012-10-04 IMAGING — CR DG CHEST 2V
2 series · 2 of 2 positions shown · non-contrast
Comparison: 08/16/2011

CLINICAL DATA: Chest pain, shortness of breath.

CHEST - 2 VIEW

[w chest pa]
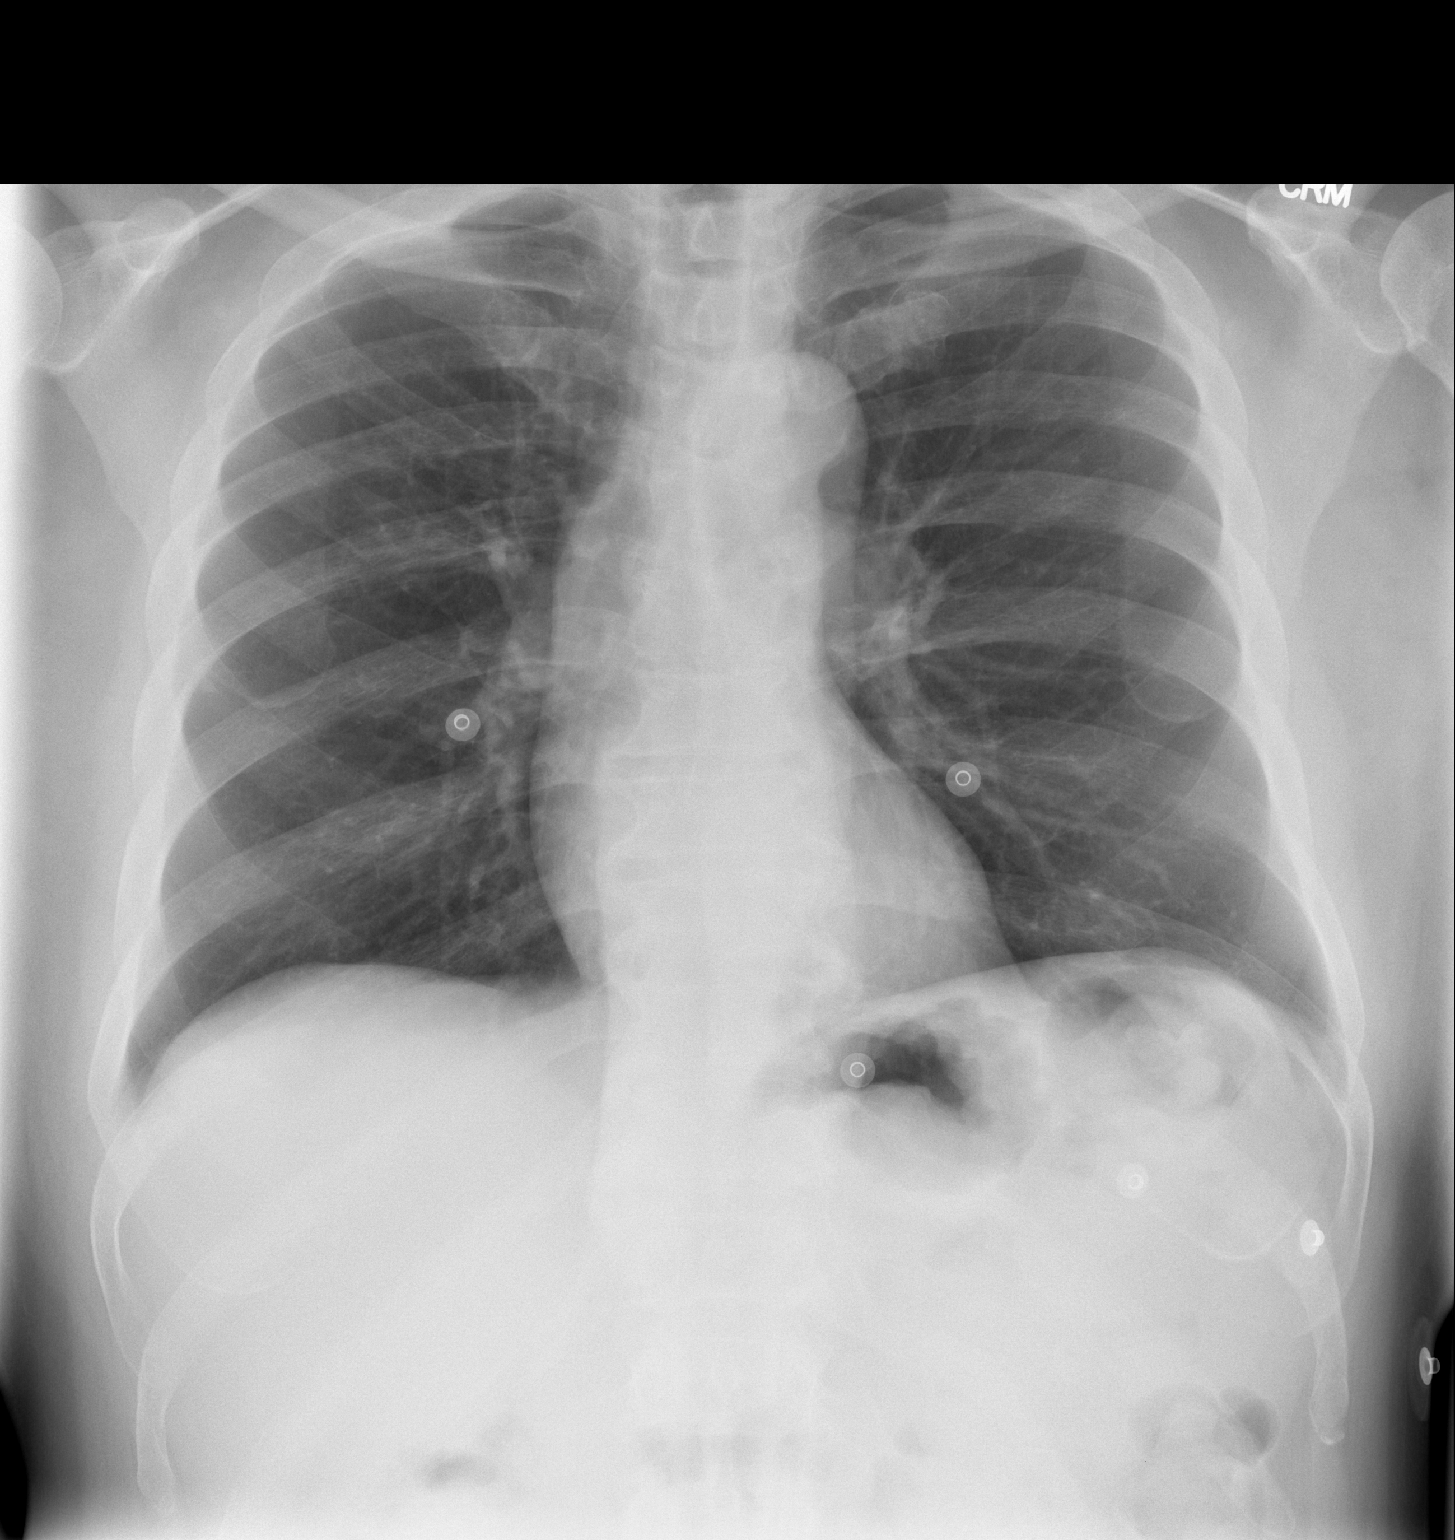

[w chest lat]
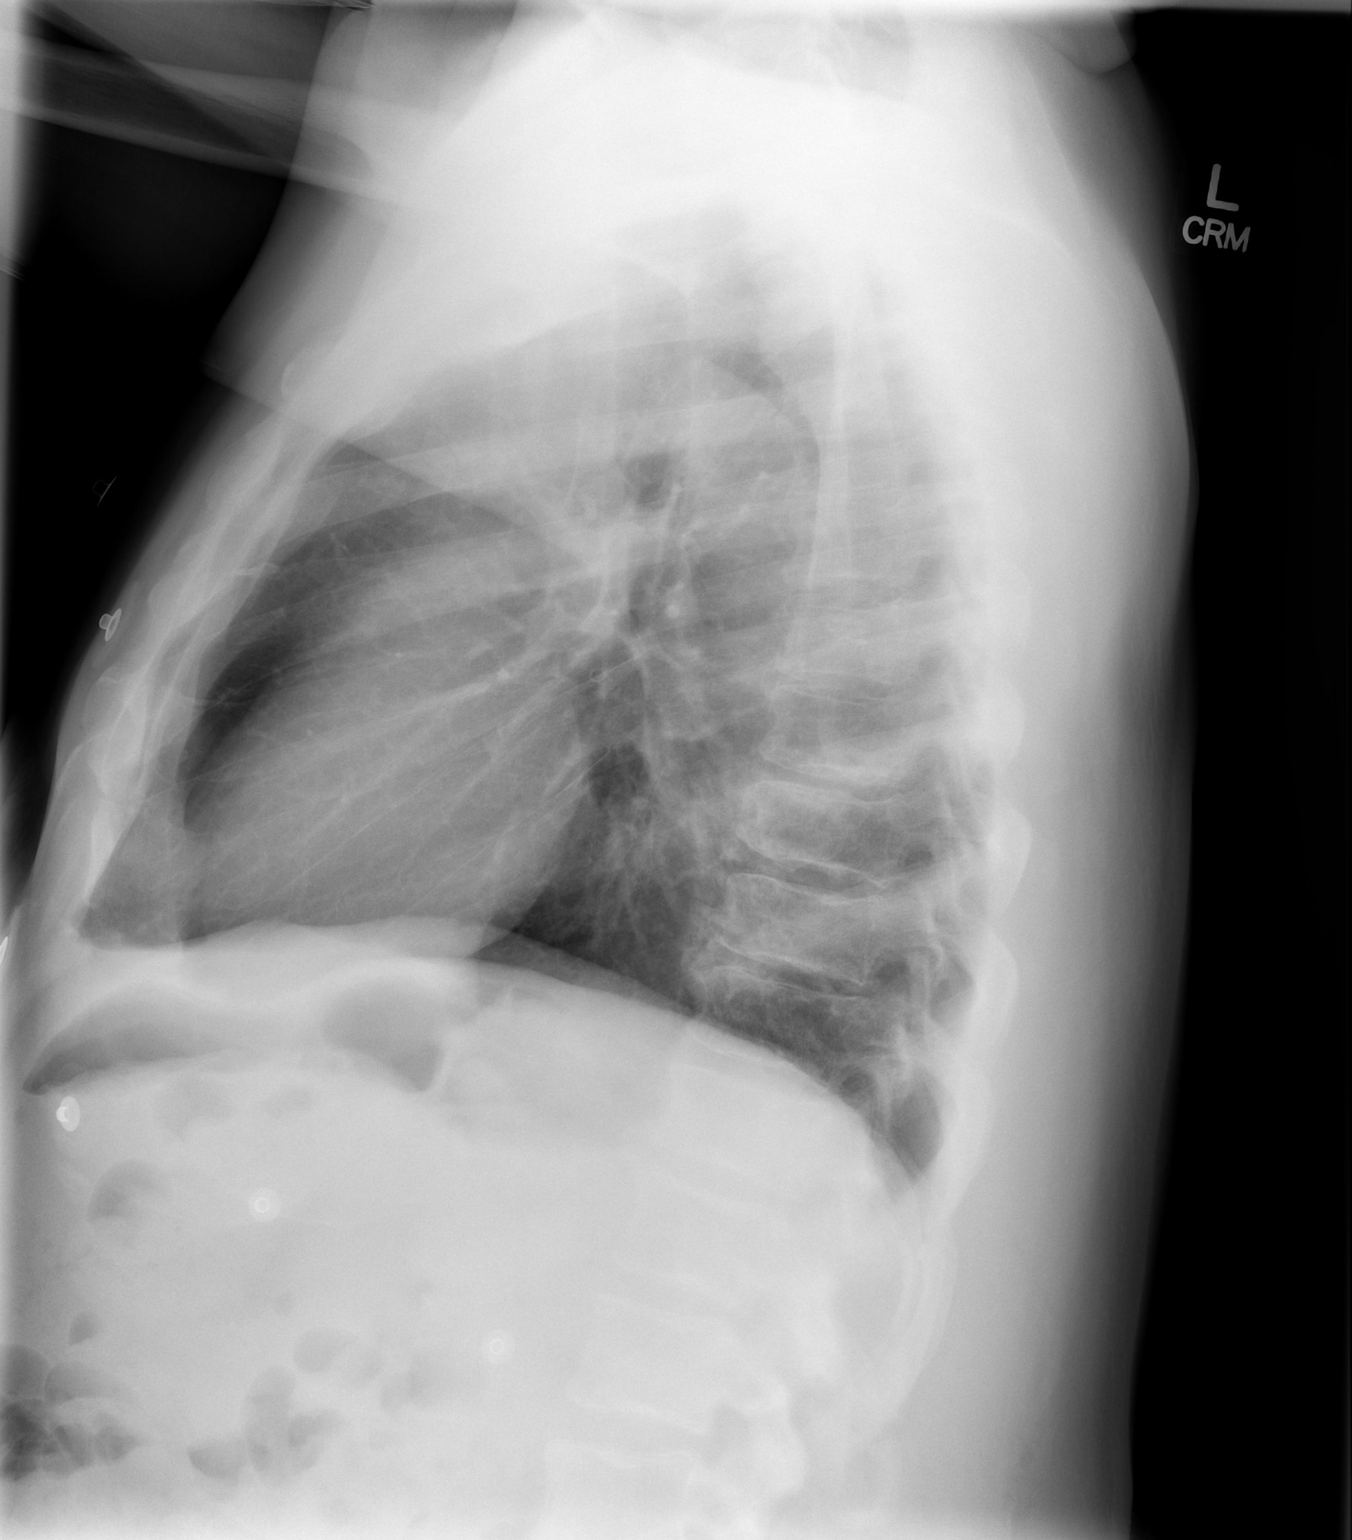

[2 of 2 positions shown; findings below may reference images not displayed]

FINDINGS: Mild tortuosity to the thoracic aorta.  Heart size within
normal limits.  No focal consolidation, pleural effusion, or
pneumothorax.  No acute osseous abnormality.  Multilevel
degenerative changes.
IMPRESSION: No acute cardiopulmonary process.

## 2012-10-04 MED ORDER — SODIUM CHLORIDE 0.9 % IV BOLUS (SEPSIS)
1000.0000 mL | Freq: Once | INTRAVENOUS | Status: AC
  Administered 2012-10-04: 1000 mL via INTRAVENOUS

## 2012-10-04 NOTE — ED Notes (Signed)
Patient C/O malaise, body aches, Nasal congestion and sore throat for several days.  Does not know if he had a fever. Denies nausea and vomiting.

## 2012-10-04 NOTE — ED Notes (Signed)
Pt states he has had cold s/s x 4-5 days and cannot keep his sugar up. Last FSBS 84 PTA

## 2012-10-04 NOTE — ED Provider Notes (Signed)
History     CSN: [id number]  Arrival date & time 10/04/12  1619   First MD Initiated Contact with Patient 10/04/12 1646      Chief Complaint  Patient presents with  . URI    (Consider location/radiation/quality/duration/timing/severity/associated sxs/prior treatment) Patient is a 61 y.o. male presenting with URI. The history is provided by the patient. No language interpreter was used.  URI The primary symptoms include fatigue, headaches, swollen glands, cough and arthralgias. Primary symptoms do not include fever, ear pain, sore throat, wheezing, abdominal pain, nausea or vomiting. The current episode started 3 to 5 days ago. This is a new problem.  The swelling is not associated with neck pain.  Symptoms associated with the illness include congestion and rhinorrhea.   61 year old male coming in complaining of upper respiratory symptoms with a cough x5 days. States he started having diarrhea yesterday and had diarrhea 5 times today. Patient is HIV positive, with depression, hyperlipidemia, diabetic, and suicidal ideations in the past. Patient is coming from [facility]. States that he took his Lantus insulin insulin at bedtime last night but has not had any of his sliding-scale insulin today. Patient denies fever nausea vomiting chest pain or shortness of breath.  Past Medical History  Diagnosis Date  . Diabetes mellitus   . Hypertension   . Angina   . Shortness of breath   . Headache   . Gout   . Hyperlipidemia   . HIV infection   . Crack cocaine use     Past Surgical History  Procedure Date  . Prostate surgery   . Bowel resection   . Back surgery     1988    Family History  Problem Relation Age of Onset  . Diabetes Mother   . Diabetes Father   . Diabetes Brother     History  Substance Use Topics  . Smoking status: Never Smoker   . Smokeless tobacco: Never Used  . Alcohol Use: No      Review of Systems  Constitutional: Positive for fatigue. Negative for  fever.  HENT: Positive for congestion, rhinorrhea and postnasal drip. Negative for ear pain, sore throat and neck pain.   Eyes: Negative.   Respiratory: Positive for cough. Negative for shortness of breath and wheezing.   Cardiovascular: Negative.   Gastrointestinal: Positive for diarrhea. Negative for nausea, vomiting, abdominal pain and constipation.  Musculoskeletal: Positive for arthralgias. Negative for back pain.  Neurological: Positive for headaches.  Psychiatric/Behavioral: Negative.   All other systems reviewed and are negative.    Allergies  Review of patient's allergies indicates no known allergies.  Home Medications   Current Outpatient Rx  Name  Route  Sig  Dispense  Refill  . ALLOPURINOL 100 MG PO TABS   Oral   Take 200 mg by mouth every morning. For gout.         . ASPIRIN 81 MG PO CHEW   Oral   Chew 81 mg by mouth daily. For blood thinner.         Marland Kitchen DILTIAZEM HCL ER COATED BEADS 240 MG PO CP24   Oral   Take 240 mg by mouth daily.         . DULOXETINE HCL 60 MG PO CPEP   Oral   Take 60 mg by mouth daily.         Marland Kitchen EMTRICITAB-RILPIVIR-TENOFOVIR 200-25-300 MG PO TABS   Oral   Take 1 tablet by mouth daily.         Marland Kitchen  GABAPENTIN 100 MG PO CAPS   Oral   Take 1 capsule (100 mg total) by mouth 3 (three) times daily. For pain & anxiety.   90 capsule   0   . GABAPENTIN 300 MG PO CAPS      TAKE 1 CAPSULE BY MOUTH THREE   TIMES A DAY   90 capsule   0   . INSULIN ASPART 100 UNIT/ML [place] SOLN      Inject 4units before breakfast & lunch; Inject 8units before dinner   30 mL   0   . INSULIN GLARGINE 100 UNIT/ML [place] SOLN      Inject 10u daily before bed   10 mL   3   . LISINOPRIL 5 MG PO TABS   Oral   Take 1 tablet (5 mg total) by mouth daily.   30 tablet   11   . METFORMIN HCL 1000 MG PO TABS   Oral   Take 1,000 mg by mouth 2 (two) times daily with a meal.         . METOPROLOL SUCCINATE ER 25 MG PO TB24   Oral   Take 25 mg by mouth  daily.         Marland Kitchen PAROXETINE HCL 20 MG PO TABS   Oral   Take 20 mg by mouth every morning.         Marland Kitchen PRAVASTATIN SODIUM 40 MG PO TABS   Oral   Take 40 mg by mouth every evening.         Marland Kitchen SILDENAFIL CITRATE 25 MG PO TABS   Oral   Take 1 tablet (25 mg total) by mouth as needed for erectile dysfunction.   20 tablet   1   . VOLTAREN 1 % TD GEL      AAPLY TO AFFECTED AREA 4 TIMES A DAY   100 g   0     BP 134/76  Pulse 84  Temp 98.4 F (36.9 C) (Oral)  Resp 20  Ht 5' 7.5" (1.715 m)  Wt 172 lb (78.019 kg)  BMI 26.54 kg/m2  SpO2 99%  Physical Exam  Nursing note and vitals reviewed. Constitutional: He is oriented to person, place, and time. He appears well-developed and well-nourished.  HENT:  Head: Normocephalic.  Eyes: Conjunctivae normal and EOM are normal. Pupils are equal, round, and reactive to light.  Neck: Normal range of motion. Neck supple.  Cardiovascular: Normal rate.   Pulmonary/Chest: Effort normal and breath sounds normal. No respiratory distress. He has no wheezes. He has no rales.  Abdominal: Soft. Bowel sounds are normal. He exhibits no distension. There is no tenderness.  Musculoskeletal: Normal range of motion.  Neurological: He is alert and oriented to person, place, and time.  Skin: Skin is warm and dry.  Psychiatric: He has a normal mood and affect.    ED Course  Procedures (including critical care time)   Labs Reviewed  GLUCOSE, CAPILLARY  BASIC METABOLIC PANEL  CBC   No results found.   No diagnosis found.    MDM  Cough x5 days with positive HIV and diabetes. Chest x-ray shows no pneumonia. Patient will return to [facility] and use over-the-counter medications to treat his cough. Normal saline bolus in the ER. Patient is ready for discharge.        Julieta Bellini, NP 10/04/12 [phone]

## 2012-10-06 ENCOUNTER — Encounter (HOSPITAL_BASED_OUTPATIENT_CLINIC_OR_DEPARTMENT_OTHER): Payer: Self-pay | Admitting: *Deleted

## 2012-10-06 ENCOUNTER — Emergency Department (HOSPITAL_BASED_OUTPATIENT_CLINIC_OR_DEPARTMENT_OTHER): Payer: Medicare Other

## 2012-10-06 ENCOUNTER — Emergency Department (HOSPITAL_BASED_OUTPATIENT_CLINIC_OR_DEPARTMENT_OTHER)
Admission: EM | Admit: 2012-10-06 | Discharge: 2012-10-06 | Disposition: A | Discharge status: Home / Self Care | Payer: Medicare Other | Attending: Emergency Medicine | Admitting: Emergency Medicine

## 2012-10-06 DIAGNOSIS — Z794 Long term (current) use of insulin: Secondary | ICD-10-CM | POA: Insufficient documentation

## 2012-10-06 DIAGNOSIS — E119 Type 2 diabetes mellitus without complications: Secondary | ICD-10-CM | POA: Insufficient documentation

## 2012-10-06 DIAGNOSIS — J069 Acute upper respiratory infection, unspecified: Secondary | ICD-10-CM

## 2012-10-06 DIAGNOSIS — Z7982 Long term (current) use of aspirin: Secondary | ICD-10-CM | POA: Insufficient documentation

## 2012-10-06 DIAGNOSIS — Z79899 Other long term (current) drug therapy: Secondary | ICD-10-CM | POA: Insufficient documentation

## 2012-10-06 DIAGNOSIS — Z8639 Personal history of other endocrine, nutritional and metabolic disease: Secondary | ICD-10-CM | POA: Insufficient documentation

## 2012-10-06 DIAGNOSIS — I10 Essential (primary) hypertension: Secondary | ICD-10-CM | POA: Insufficient documentation

## 2012-10-06 DIAGNOSIS — Z862 Personal history of diseases of the blood and blood-forming organs and certain disorders involving the immune mechanism: Secondary | ICD-10-CM | POA: Insufficient documentation

## 2012-10-06 DIAGNOSIS — E785 Hyperlipidemia, unspecified: Secondary | ICD-10-CM | POA: Insufficient documentation

## 2012-10-06 DIAGNOSIS — I209 Angina pectoris, unspecified: Secondary | ICD-10-CM | POA: Insufficient documentation

## 2012-10-06 DIAGNOSIS — B20 Human immunodeficiency virus [HIV] disease: Secondary | ICD-10-CM | POA: Insufficient documentation

## 2012-10-06 LAB — GLUCOSE, CAPILLARY: Glucose-Capillary: 108 mg/dL — ABNORMAL HIGH (ref 70–99)

## 2012-10-06 MED ORDER — ALBUTEROL SULFATE (5 MG/ML) 0.5% IN NEBU
5.0000 mg | INHALATION_SOLUTION | Freq: Once | RESPIRATORY_TRACT | Status: AC
  Administered 2012-10-06: 5 mg via RESPIRATORY_TRACT
  Filled 2012-10-06: qty 1

## 2012-10-06 NOTE — ED Provider Notes (Signed)
Medical screening examination/treatment/procedure(s) were performed by non-physician practitioner and as supervising physician I was immediately available for consultation/collaboration.  Veryl Speak, MD 10/06/12 [phone]

## 2012-10-06 NOTE — ED Provider Notes (Signed)
History     CSN: [id number]  Arrival date & time 10/06/12  1324   First MD Initiated Contact with Patient 10/06/12 1412      Chief Complaint  Patient presents with  . Generalized Body Aches    (Consider location/radiation/quality/duration/timing/severity/associated sxs/prior treatment) HPI Patient with uri symptoms that began 4 days ago with nasal congestion,sore throat, headache, cough nonproductive.  Patient with headache with cough.  He has been eating drinking wihtout difficulty.  Patient at [facility] and room mate with similar symptoms.  Patient taking meds as instructed.  Hiv positive for a year.  Patient reports normal counts with hiv positive status and no aids defining illness.  Blood sugars are 71- 201.  Patient seen here on Saturday and cough and uri.  No new meds started on Saturday.  Patient taking robitussin and mucomyst.  Patient is not a smoker and has been detoxing for two weeks from cocain.  PMD [facility] outpatient.  States back today due to cough.   Past Medical History  Diagnosis Date  . Diabetes mellitus   . Hypertension   . Angina   . Shortness of breath   . Headache   . Gout   . Hyperlipidemia   . HIV infection   . Crack cocaine use     Past Surgical History  Procedure Date  . Prostate surgery   . Bowel resection   . Back surgery     1988    Family History  Problem Relation Age of Onset  . Diabetes Mother   . Diabetes Father   . Diabetes Brother     History  Substance Use Topics  . Smoking status: Never Smoker   . Smokeless tobacco: Never Used  . Alcohol Use: No      Review of Systems  Constitutional: Negative for fever and chills.  HENT: Negative for neck stiffness.   Eyes: Negative for visual disturbance.  Respiratory: Negative for shortness of breath.   Cardiovascular: Negative for chest pain.  Gastrointestinal: Negative for vomiting, diarrhea and blood in stool.  Genitourinary: Negative for dysuria, frequency and decreased urine  volume.  Musculoskeletal: Negative for myalgias and joint swelling.  Skin: Negative for rash.  Neurological: Negative for weakness.  Hematological: Negative for adenopathy.  Psychiatric/Behavioral: Negative for agitation.    Allergies  Review of patient's allergies indicates no known allergies.  Home Medications   Current Outpatient Rx  Name  Route  Sig  Dispense  Refill  . ALLOPURINOL 100 MG PO TABS   Oral   Take 200 mg by mouth every morning. For gout.         . ASPIRIN 81 MG PO CHEW   Oral   Chew 81 mg by mouth daily. For blood thinner.         Marland Kitchen DILTIAZEM HCL ER COATED BEADS 240 MG PO CP24   Oral   Take 240 mg by mouth daily.         . DULOXETINE HCL 60 MG PO CPEP   Oral   Take 60 mg by mouth daily.         Marland Kitchen EMTRICITAB-RILPIVIR-TENOFOVIR 200-25-300 MG PO TABS   Oral   Take 1 tablet by mouth daily.         Marland Kitchen GABAPENTIN 100 MG PO CAPS   Oral   Take 1 capsule (100 mg total) by mouth 3 (three) times daily. For pain & anxiety.   90 capsule   0   . GABAPENTIN 300 MG PO  CAPS      TAKE 1 CAPSULE BY MOUTH THREE   TIMES A DAY   90 capsule   0   . INSULIN ASPART 100 UNIT/ML [place] SOLN      Inject 4units before breakfast & lunch; Inject 8units before dinner   30 mL   0   . INSULIN GLARGINE 100 UNIT/ML [place] SOLN      Inject 10u daily before bed   10 mL   3   . LISINOPRIL 5 MG PO TABS   Oral   Take 1 tablet (5 mg total) by mouth daily.   30 tablet   11   . METFORMIN HCL 1000 MG PO TABS   Oral   Take 1,000 mg by mouth 2 (two) times daily with a meal.         . METOPROLOL SUCCINATE ER 25 MG PO TB24   Oral   Take 25 mg by mouth daily.         Marland Kitchen PAROXETINE HCL 20 MG PO TABS   Oral   Take 20 mg by mouth every morning.         Marland Kitchen PRAVASTATIN SODIUM 40 MG PO TABS   Oral   Take 40 mg by mouth every evening.         Marland Kitchen SILDENAFIL CITRATE 25 MG PO TABS   Oral   Take 1 tablet (25 mg total) by mouth as needed for erectile dysfunction.   20  tablet   1   . VOLTAREN 1 % TD GEL      AAPLY TO AFFECTED AREA 4 TIMES A DAY   100 g   0     BP 133/77  Pulse 82  Temp 98.2 F (36.8 C) (Oral)  Resp 20  SpO2 100%  Physical Exam  Nursing note and vitals reviewed. Constitutional: He appears well-developed and well-nourished.  HENT:  Head: Normocephalic and atraumatic.    ED Course  Procedures (including critical care time)  Labs Reviewed  GLUCOSE, CAPILLARY - Abnormal; Notable for the following:    Glucose-Capillary 108 (*)     All other components within normal limits   Dg Chest 2 View  10/04/2012  *RADIOLOGY REPORT*  Clinical Data: Palpitations and asthma.  Cough.  CHEST - 2 VIEW  Comparison: 12/05/2011.  Findings: Trachea is midline.  Heart size normal.  Lungs are clear. No pleural fluid.  Degenerative changes are seen in the spine.  IMPRESSION: No acute findings.   Original Report Authenticated By: Lorin Picket, M.D.      No diagnosis found.    CXR clear.  Patient with uri symtpoms.  Patient advised and discharged [facility].         Shaune Pollack, MD 10/06/12 [phone]

## 2012-10-06 NOTE — ED Notes (Signed)
Pt with flu like sx and chills as well as a BS of 71

## 2012-10-07 ENCOUNTER — Encounter (HOSPITAL_BASED_OUTPATIENT_CLINIC_OR_DEPARTMENT_OTHER): Payer: Self-pay | Admitting: *Deleted

## 2012-10-07 DIAGNOSIS — Z79899 Other long term (current) drug therapy: Secondary | ICD-10-CM | POA: Insufficient documentation

## 2012-10-07 DIAGNOSIS — Z7982 Long term (current) use of aspirin: Secondary | ICD-10-CM | POA: Insufficient documentation

## 2012-10-07 DIAGNOSIS — I10 Essential (primary) hypertension: Secondary | ICD-10-CM | POA: Insufficient documentation

## 2012-10-07 DIAGNOSIS — Z21 Asymptomatic human immunodeficiency virus [HIV] infection status: Secondary | ICD-10-CM | POA: Insufficient documentation

## 2012-10-07 DIAGNOSIS — Z794 Long term (current) use of insulin: Secondary | ICD-10-CM | POA: Insufficient documentation

## 2012-10-07 DIAGNOSIS — M109 Gout, unspecified: Secondary | ICD-10-CM | POA: Insufficient documentation

## 2012-10-07 DIAGNOSIS — E785 Hyperlipidemia, unspecified: Secondary | ICD-10-CM | POA: Insufficient documentation

## 2012-10-07 DIAGNOSIS — Z8679 Personal history of other diseases of the circulatory system: Secondary | ICD-10-CM | POA: Insufficient documentation

## 2012-10-07 DIAGNOSIS — IMO0001 Reserved for inherently not codable concepts without codable children: Secondary | ICD-10-CM | POA: Insufficient documentation

## 2012-10-07 DIAGNOSIS — F1410 Cocaine abuse, uncomplicated: Secondary | ICD-10-CM | POA: Insufficient documentation

## 2012-10-07 NOTE — ED Notes (Signed)
I took cbg, got reading of 169 mg./dcltr.

## 2012-10-07 NOTE — ED Notes (Signed)
Pt states he had gone out with some people from [facility] and drank some coffee and ate 2 chocolate chip cookies. Began to feel dizzy. Checked blood sugar and it was 250. They sent him here because it has to be below 200. No dizziness at present. FSBS done at triage. Normally runs 64-101.

## 2012-10-08 ENCOUNTER — Emergency Department (HOSPITAL_BASED_OUTPATIENT_CLINIC_OR_DEPARTMENT_OTHER)
Admission: EM | Admit: 2012-10-08 | Discharge: 2012-10-08 | Disposition: A | Discharge status: Home / Self Care | Payer: Medicare Other | Attending: Emergency Medicine | Admitting: Emergency Medicine

## 2012-10-08 ENCOUNTER — Telehealth: Payer: Self-pay | Admitting: *Deleted

## 2012-10-08 DIAGNOSIS — R739 Hyperglycemia, unspecified: Secondary | ICD-10-CM

## 2012-10-08 NOTE — Telephone Encounter (Signed)
Pt will be seen fri 1/3 at 1315, treatment facility notified and also pt, the facility will make sure pt is here, i notified [place]. And she will have a template made to put him on, dr bhardwaj cannot be present this pm

## 2012-10-08 NOTE — ED Provider Notes (Signed)
History     CSN: [id number]  Arrival date & time 10/07/12  2301   First MD Initiated Contact with Patient 10/08/12 0136      Chief Complaint  Patient presents with  . Hyperglycemia    (Consider location/radiation/quality/duration/timing/severity/associated sxs/prior treatment) Patient is a 62 y.o. male presenting with diabetes problem. The history is provided by the patient.  Diabetes He presents for his follow-up diabetic visit. He has type 2 diabetes mellitus. No MedicAlert identification noted. His disease course has been stable. Pertinent negatives for hypoglycemia include no confusion, dizziness, headaches, hunger or seizures. Pertinent negatives for diabetes include no blurred vision, no chest pain, no fatigue, no foot paresthesias, no foot ulcerations, no polydipsia, no visual change and no weakness. Symptoms are stable. Pertinent negatives for diabetic complications include no CVA. Current diabetic treatment includes insulin injections and oral agent (monotherapy). He is compliant with treatment all of the time. Home blood sugar record trend: increased following coffee with sugar and cookies and was sent in by [facility].    Past Medical History  Diagnosis Date  . Diabetes mellitus   . Hypertension   . Angina   . Shortness of breath   . Headache   . Gout   . Hyperlipidemia   . HIV infection   . Crack cocaine use     Past Surgical History  Procedure Date  . Prostate surgery   . Bowel resection   . Back surgery     1988    Family History  Problem Relation Age of Onset  . Diabetes Mother   . Diabetes Father   . Diabetes Brother     History  Substance Use Topics  . Smoking status: Never Smoker   . Smokeless tobacco: Never Used  . Alcohol Use: No      Review of Systems  Constitutional: Negative for fatigue.  Eyes: Negative for blurred vision.  Respiratory: Negative for shortness of breath.   Cardiovascular: Negative for chest pain.  Neurological: Negative  for dizziness, seizures, weakness and headaches.  Hematological: Negative for polydipsia.  Psychiatric/Behavioral: Negative for confusion.  All other systems reviewed and are negative.    Allergies  Review of patient's allergies indicates no known allergies.  Home Medications   Current Outpatient Rx  Name  Route  Sig  Dispense  Refill  . ALLOPURINOL 100 MG PO TABS   Oral   Take 200 mg by mouth every morning. For gout.         . ASPIRIN 81 MG PO CHEW   Oral   Chew 81 mg by mouth daily. For blood thinner.         Marland Kitchen DILTIAZEM HCL ER COATED BEADS 240 MG PO CP24   Oral   Take 240 mg by mouth daily.         . DULOXETINE HCL 60 MG PO CPEP   Oral   Take 60 mg by mouth daily.         Marland Kitchen EMTRICITAB-RILPIVIR-TENOFOVIR 200-25-300 MG PO TABS   Oral   Take 1 tablet by mouth daily.         Marland Kitchen GABAPENTIN 100 MG PO CAPS   Oral   Take 1 capsule (100 mg total) by mouth 3 (three) times daily. For pain & anxiety.   90 capsule   0   . GABAPENTIN 300 MG PO CAPS      TAKE 1 CAPSULE BY MOUTH THREE   TIMES A DAY   90 capsule   0   .  INSULIN ASPART 100 UNIT/ML [place] SOLN      Inject 4units before breakfast & lunch; Inject 8units before dinner   30 mL   0   . INSULIN GLARGINE 100 UNIT/ML [place] SOLN      Inject 10u daily before bed   10 mL   3   . LISINOPRIL 5 MG PO TABS   Oral   Take 1 tablet (5 mg total) by mouth daily.   30 tablet   11   . METFORMIN HCL 1000 MG PO TABS   Oral   Take 1,000 mg by mouth 2 (two) times daily with a meal.         . METOPROLOL SUCCINATE ER 25 MG PO TB24   Oral   Take 25 mg by mouth daily.         Marland Kitchen PAROXETINE HCL 20 MG PO TABS   Oral   Take 20 mg by mouth every morning.         Marland Kitchen PRAVASTATIN SODIUM 40 MG PO TABS   Oral   Take 40 mg by mouth every evening.         Marland Kitchen SILDENAFIL CITRATE 25 MG PO TABS   Oral   Take 1 tablet (25 mg total) by mouth as needed for erectile dysfunction.   20 tablet   1   . VOLTAREN 1 % TD GEL        AAPLY TO AFFECTED AREA 4 TIMES A DAY   100 g   0     BP 125/70  Pulse 61  Temp 97.8 F (36.6 C) (Oral)  Resp 20  Ht 5' 7.5" (1.715 m)  Wt 172 lb (78.019 kg)  BMI 26.54 kg/m2  SpO2 100%  Physical Exam  Constitutional: He is oriented to person, place, and time. He appears well-developed and well-nourished. No distress.  HENT:  Head: Normocephalic and atraumatic.  Mouth/Throat: Oropharynx is clear and moist.  Eyes: Conjunctivae normal are normal. Pupils are equal, round, and reactive to light.  Neck: Normal range of motion. Neck supple.  Cardiovascular: Normal rate and regular rhythm.   Pulmonary/Chest: Effort normal and breath sounds normal. He has no wheezes. He has no rales.  Abdominal: Soft. Bowel sounds are normal. There is no tenderness. There is no rebound and no guarding.  Musculoskeletal: Normal range of motion.  Neurological: He is alert and oriented to person, place, and time. He has normal reflexes.  Skin: Skin is warm and dry.  Psychiatric: He has a normal mood and affect.    ED Course  Procedures (including critical care time)  Labs Reviewed  GLUCOSE, CAPILLARY - Abnormal; Notable for the following:    Glucose-Capillary 169 (*)     All other components within normal limits   Dg Chest 2 View  10/06/2012  *RADIOLOGY REPORT*  Clinical Data: Pt with flu like symptoms and chills as well as a blood sugar of 71, hx of HIV, HTN, diabetes, SOB, gout, hyperlipidemia, crack/cocaine use, no other complaints  CHEST - 2 VIEW  Comparison: 10/04/2012  Findings: Cardiac and mediastinal contours appear normal.  The lungs appear clear.  No pleural effusion is identified.  Thoracic spondylosis noted.  IMPRESSION:  1.  No acute findings.   Original Report Authenticated By: Van Clines, M.D.      No diagnosis found.    MDM  Feels well no current complaints.  Sugard back into normal range will d/c with close follow up      Aerin Delany K  [place], MD 10/08/12  [id number]

## 2012-10-08 NOTE — Telephone Encounter (Signed)
Is it not possible to schedule a walk-in sick visit, today or tomorrow?

## 2012-10-08 NOTE — Telephone Encounter (Signed)
If there is a no show, then pt can be worked in. If not, I will come down and see pt.

## 2012-10-08 NOTE — Telephone Encounter (Signed)
Pt calls and states he has been in ED several times in last few days for problems r/t to high blood sugars. He is also just being admitted to inpt drug treatment program, he has a great desire to improve his life. He has been informed at the treatment program that due to his erratic blood sugars he must see someone at his pcp office today or tomorrow instead of constant trips to the ED. i have spoken to [place]. And today and tomorrow do not look promising for appts. She is speaking to doriss. And i will speak to the attendings, dr Ellwood Dense and Software engineer.

## 2012-10-09 ENCOUNTER — Ambulatory Visit (INDEPENDENT_AMBULATORY_CARE_PROVIDER_SITE_OTHER): Payer: Medicare Other | Admitting: Internal Medicine

## 2012-10-09 ENCOUNTER — Encounter: Payer: Self-pay | Admitting: Dietician

## 2012-10-09 ENCOUNTER — Ambulatory Visit (INDEPENDENT_AMBULATORY_CARE_PROVIDER_SITE_OTHER): Payer: Medicare Other | Admitting: Dietician

## 2012-10-09 ENCOUNTER — Encounter: Payer: Self-pay | Admitting: Internal Medicine

## 2012-10-09 VITALS — BP 120/79 | HR 84 | Temp 97.9°F | Ht 67.5 in | Wt 183.3 lb

## 2012-10-09 DIAGNOSIS — E1149 Type 2 diabetes mellitus with other diabetic neurological complication: Secondary | ICD-10-CM

## 2012-10-09 DIAGNOSIS — Z0000 Encounter for general adult medical examination without abnormal findings: Secondary | ICD-10-CM

## 2012-10-09 DIAGNOSIS — E1142 Type 2 diabetes mellitus with diabetic polyneuropathy: Secondary | ICD-10-CM

## 2012-10-09 DIAGNOSIS — E1140 Type 2 diabetes mellitus with diabetic neuropathy, unspecified: Secondary | ICD-10-CM

## 2012-10-09 DIAGNOSIS — Z79899 Other long term (current) drug therapy: Secondary | ICD-10-CM

## 2012-10-09 DIAGNOSIS — IMO0002 Reserved for concepts with insufficient information to code with codable children: Secondary | ICD-10-CM

## 2012-10-09 DIAGNOSIS — J45909 Unspecified asthma, uncomplicated: Secondary | ICD-10-CM

## 2012-10-09 DIAGNOSIS — E119 Type 2 diabetes mellitus without complications: Secondary | ICD-10-CM

## 2012-10-09 DIAGNOSIS — I10 Essential (primary) hypertension: Secondary | ICD-10-CM

## 2012-10-09 LAB — POCT GLYCOSYLATED HEMOGLOBIN (HGB A1C): Hemoglobin A1C: 7.2

## 2012-10-09 MED ORDER — INSULIN GLARGINE 100 UNIT/ML ~~LOC~~ SOLN: SUBCUTANEOUS | Status: DC

## 2012-10-09 MED ORDER — ALBUTEROL SULFATE (5 MG/ML) 0.5% IN NEBU: 2.5000 mg | INHALATION_SOLUTION | Freq: Once | RESPIRATORY_TRACT | Status: DC

## 2012-10-09 MED ORDER — ALBUTEROL SULFATE HFA 108 (90 BASE) MCG/ACT IN AERS: 2.0000 | INHALATION_SPRAY | Freq: Four times a day (QID) | RESPIRATORY_TRACT | Status: DC | PRN

## 2012-10-09 NOTE — Patient Instructions (Addendum)
General Instructions: Decrease your Lantus from 25 units at bedtime to 20 units. Take all other diabetes medicines as directed. Do not have extra unplanned snacks - they will increase your sugar Try the albuterol inhaler for your asthma. See Dr Burnard Bunting on Jan 15th  Treatment Goals:  Goals (1 Years of Data) as of 10/09/2012          As of Today 10/07/12 10/06/12 10/04/12 07/07/12     Blood Pressure    . Blood Pressure < 140/90  120/79 125/70 133/77 134/76 138/90     Result Component    . HEMOGLOBIN A1C < 7.0          . LDL CALC < 100            Progress Toward Treatment Goals:  Treatment Goal 10/09/2012  Hemoglobin A1C unable to assess  Blood pressure at goal    Self Care Goals & Plans:  Self Care Goal 10/09/2012  Manage my medications take my medicines as prescribed  Monitor my health keep track of my blood glucose; bring my glucose meter and log to each visit  Eat healthy foods eat smaller portions    Home Blood Glucose Monitoring 10/09/2012  Check my blood sugar 4 times a day  When to check my blood sugar before breakfast; before lunch; before dinner; at bedtime     Care Management & Community Referrals:  Referral 10/09/2012  Referrals made for care management support diabetes educator

## 2012-10-09 NOTE — Progress Notes (Signed)
Medical Nutrition Therapy:  Appt start time: 1500 end time:  1535  CBG 75 -after lunch with no Novolog today or yesterday and had two hot dogs without bread and coleslaw.   Assessment:  Primary concerns today: Blood sugar control.and weight gain Usual eating pattern includes 3 meals & 3 snacks at treatment center.  Weight is increased 11# per records. A1C decreased from 9% to 7.2%.   He is limiting amounts of carbs and has much more consistent blood sugar control.   Progress Towards Goal(s):  In progress.   Nutritional Diagnosis:  NB-1.1 Food and nutrition-related knowledge deficit As related to lack of prior exposure to diabetes meal  Planning, carb consistency and portions sizes of  carbohydrates  As evidenced by his report is improved.    Intervention:  1- Nutrition education about carbohydrate portions, consistency. 2- Nutrition education about interpreting blood glucose data, prevention, detection and treatment of hypoglycemia and insulin types, action and use.    Monitoring/Evaluation:  Dietary intake, exercise, blood glucose testing beore and after dinner, and body weight in 8 week(s).(February/March)

## 2012-10-11 DIAGNOSIS — J45901 Unspecified asthma with (acute) exacerbation: Secondary | ICD-10-CM | POA: Insufficient documentation

## 2012-10-11 NOTE — Assessment & Plan Note (Deleted)
For about 5-7 days he has had a "cold." His roommate also has a cold. Sxs inc non-productive cough, dyspnea, chest tightness

## 2012-10-11 NOTE — Assessment & Plan Note (Signed)
For about 5-7 days he has had a "cold." His roommate also has a cold. Sxs inc non-productive cough, dyspnea, chest tightness, diaphoresis, sore throat. No N/V/D, CP, temp, anorexia. Got neb in Er and it helped and request the same here. Exam shows good air movement, able to speak in full sentences. Wheezes end exp pre and post albuterol neb. No ABX or steroids needed. Gave sample of alb MDI and we went over proper usage. And sent Rx to pharmacy. Monitor usage - may need stepped up tx.

## 2012-10-11 NOTE — Progress Notes (Signed)
  Subjective:    Patient ID: Michael [name], male    DOB: 1951-03-28, 62 y.o.   MRN: [id number]  HPI  Please see the A&P for the status of the pt's chronic medical problems.  Review of Systems  Constitutional: Positive for unexpected weight change. Negative for fever, chills, activity change and appetite change.  HENT: Positive for sore throat and rhinorrhea.   Eyes: Negative for pain and itching.  Respiratory: Positive for cough, chest tightness and shortness of breath.   Cardiovascular: Negative for chest pain.  Gastrointestinal: Negative for nausea, vomiting and diarrhea.       Objective:   Physical Exam  Constitutional: He is oriented to person, place, and time. He appears well-developed and well-nourished. No distress.  HENT:  Head: Normocephalic and atraumatic.  Right Ear: External ear normal.  Left Ear: External ear normal.  Nose: Nose normal.  Mouth/Throat: Uvula is midline and mucous membranes are normal. No uvula swelling. Posterior oropharyngeal erythema present. No oropharyngeal exudate or posterior oropharyngeal edema.  Eyes: Conjunctivae normal and EOM are normal. Pupils are equal, round, and reactive to light. Right eye exhibits no discharge. Left eye exhibits no discharge. No scleral icterus.  Neck: Normal range of motion. Neck supple.  Cardiovascular: Normal rate, regular rhythm and normal heart sounds.  Exam reveals no gallop and no friction rub.   No murmur heard. Pulmonary/Chest: Effort normal. He has wheezes.  Musculoskeletal: Normal range of motion. He exhibits no edema and no tenderness.  Lymphadenopathy:    He has no cervical adenopathy.  Neurological: He is alert and oriented to person, place, and time.  Skin: Skin is warm and dry. He is not diaphoretic.  Psychiatric: He has a normal mood and affect. His behavior is normal. Judgment and thought content normal.          Assessment & Plan:

## 2012-10-11 NOTE — Assessment & Plan Note (Signed)
BP Readings from Last 3 Encounters:  10/09/12 120/79  10/07/12 125/70  10/06/12 133/77    Lab Results  Component Value Date   NA 145 07/27/2012   K 3.9 07/27/2012   CREATININE 1.06 07/27/2012    Assessment:  Blood pressure control: controlled  Progress toward BP goal:  at goal  Comments: Great control  Plan:  Medications:  continue current medications  Educational resources provided:    Self management tools provided:    Other plans: F/U Dr Burnard Bunting

## 2012-10-11 NOTE — Assessment & Plan Note (Addendum)
Pt is taking Lantus 25 units QHS. Last night he took 30 units for some reason. He also uses Novolog 4 units breakfast, 4 units lunch, and 8 units dinner. He checks his CBG QAC and QHS. Usually, AM CBG's are 60's or 70's. Pre lunch about 80's, and pre dinner < 100. Pre bedtime are 140 - 160. He meter seems to confirm these levels - 75% at goal, 18% low, 7% high. A1C has decreased from 9.0 to 7.2 today.  He is at an inpt drug tx center and protocol indicates that any CBG > 200 is sent to the ER. He has gone recently twice for CBG > 200. Both instances occurred when he had an unplanned snack late evening of coffee +/- cream +/- sugar +/- choc chip cookies.   His diet is planned by the center - he stated no dietician. He is concerned bc lots of starch, sugar free cookies with lunch, dinner, and bedtime snack. Bedtime snack is also a PB sandwich and sugar free drink. But his meals are planned, scheduled, and routine.   We agreed to the following 1. He will see Butch Penny for dietary advice 2. Decrease Lantus from 25 to 10 units to decreased the AM lows. 3. Stop all unplanned snacks to prevent the highs. 4. Has F/U Dr Burnard Bunting in couple of weeks 5. Keep Metformin as is  Lab Results  Component Value Date   HGBA1C 7.2 10/09/2012   HGBA1C 9.0* 05/13/2012   HGBA1C 9.0 04/29/2012     Assessment:  Diabetes control: fair control  Progress toward A1C goal:  at goal (A1C today pending)  Comments: Great improvement on DM control  Plan:  Medications:  See free text  Home glucose monitoring:   Frequency: 4 times a day   Timing: before breakfast;before lunch;before dinner;at bedtime  Instruction/counseling given: reminded to bring blood glucose meter & log to each visit, discussed diet and other instruction/counseling: Referral to [employer] provided:    Self management tools provided: copy of home glucose meter download  Other plans: See free text

## 2012-10-12 ENCOUNTER — Ambulatory Visit: Payer: Self-pay | Admitting: Internal Medicine

## 2012-10-14 ENCOUNTER — Encounter: Payer: Self-pay | Admitting: Internal Medicine

## 2012-10-21 ENCOUNTER — Encounter: Payer: Self-pay | Admitting: Internal Medicine

## 2012-10-21 ENCOUNTER — Ambulatory Visit (INDEPENDENT_AMBULATORY_CARE_PROVIDER_SITE_OTHER): Payer: Medicare Other | Admitting: Internal Medicine

## 2012-10-21 VITALS — BP 110/66 | HR 72 | Temp 97.4°F | Wt 183.1 lb

## 2012-10-21 DIAGNOSIS — IMO0001 Reserved for inherently not codable concepts without codable children: Secondary | ICD-10-CM

## 2012-10-21 DIAGNOSIS — IMO0002 Reserved for concepts with insufficient information to code with codable children: Secondary | ICD-10-CM

## 2012-10-21 DIAGNOSIS — E1140 Type 2 diabetes mellitus with diabetic neuropathy, unspecified: Secondary | ICD-10-CM

## 2012-10-21 DIAGNOSIS — E1142 Type 2 diabetes mellitus with diabetic polyneuropathy: Secondary | ICD-10-CM

## 2012-10-21 DIAGNOSIS — M791 Myalgia, unspecified site: Secondary | ICD-10-CM

## 2012-10-21 DIAGNOSIS — E1149 Type 2 diabetes mellitus with other diabetic neurological complication: Secondary | ICD-10-CM

## 2012-10-21 DIAGNOSIS — J45909 Unspecified asthma, uncomplicated: Secondary | ICD-10-CM

## 2012-10-21 NOTE — Assessment & Plan Note (Signed)
Continues to have wheezing on physical exam. Encouraged patient to increase use of albuterol inhaler, he did not know he could use this medication every 6 hours and had only been using it once daily. I suggested that at least for the next 2 days he is to every 6 hours scheduled. If symptoms worsen or fail to improve return to clinic

## 2012-10-21 NOTE — Progress Notes (Signed)
Subjective:   Patient ID: Michael [name] male   DOB: Jun 13, 1951 62 y.o.   MRN: [id number]  HPI: Michael [name] is a 62 y.o. history of diabetes and hypertension who presents for followup. He was most recently seen in clinic 10 days ago for his chronic problems. He is currently back in a drug rehabilitation program, but does not know the name or number.  Today he reports that his cold seems to be getting better, but he continues to have some shortness of breath and chest tightness. He uses his albuterol inhaler approximately once daily. Decreased cough and congestion, though still persistent.  Regarding his diabetes, his sugars have been better controlled, but he has had some lows in the upper 60s. Med reconciliation suggests that he has been taking glyburide as well, which was discontinued in the past. He tells me he has not eaten mealtime insulin because his sugars go too low, but he is taking Lantus 20 units before bed.  Finally he complains of One month of b/l (R>L) posterior leg (thigh) pain, crampy, worse with walking, better with rest; 8/10 at worst, currently 0/10; never before  Past Medical History  Diagnosis Date  . Diabetes mellitus   . Hypertension   . Angina   . Shortness of breath   . Headache   . Gout   . Hyperlipidemia   . HIV infection   . Crack cocaine use    Current Outpatient Prescriptions  Medication Sig Dispense Refill  . albuterol (PROVENTIL HFA;VENTOLIN HFA) 108 (90 BASE) MCG/ACT inhaler Inhale 2 puffs into the lungs every 6 (six) hours as needed for wheezing.  1 Inhaler  2  . allopurinol (ZYLOPRIM) 100 MG tablet Take 200 mg by mouth every morning. For gout.      Marland Kitchen aspirin 81 MG chewable tablet Chew 81 mg by mouth daily. For blood thinner.      . diltiazem (CARDIZEM CD) 240 MG 24 hr capsule Take 240 mg by mouth daily.      . DULoxetine (CYMBALTA) 60 MG capsule Take 60 mg by mouth daily.      . Emtricitab-Rilpivir-Tenofovir (COMPLERA) 200-25-300 MG TABS  Take 1 tablet by mouth daily.      Marland Kitchen gabapentin (NEURONTIN) 100 MG capsule Take 1 capsule (100 mg total) by mouth 3 (three) times daily. For pain & anxiety.  90 capsule  0  . gabapentin (NEURONTIN) 300 MG capsule TAKE 1 CAPSULE BY MOUTH THREE   TIMES A DAY  90 capsule  0  . insulin glargine (LANTUS) 100 UNIT/ML injection Inject 20 units daily before bed  10 mL  3  . lisinopril (PRINIVIL,ZESTRIL) 5 MG tablet Take 1 tablet (5 mg total) by mouth daily.  30 tablet  11  . metFORMIN (GLUCOPHAGE) 1000 MG tablet Take 1,000 mg by mouth 2 (two) times daily with a meal.      . metoprolol succinate (TOPROL-XL) 25 MG 24 hr tablet Take 25 mg by mouth daily.      Marland Kitchen PARoxetine (PAXIL) 20 MG tablet Take 20 mg by mouth every morning.      . pravastatin (PRAVACHOL) 40 MG tablet Take 40 mg by mouth every evening.      . traMADol (ULTRAM) 50 MG tablet       . VOLTAREN 1 % GEL AAPLY TO AFFECTED AREA 4 TIMES A DAY  100 g  0  . amantadine (SYMMETREL) 100 MG capsule       . insulin aspart (NOVOLOG) 100 UNIT/ML  injection Inject 4units before breakfast & lunch; Inject 8units before dinner  30 mL  0  . sildenafil (VIAGRA) 25 MG tablet Take 1 tablet (25 mg total) by mouth as needed for erectile dysfunction.  20 tablet  1   Family History  Problem Relation Age of Onset  . Diabetes Mother   . Diabetes Father   . Diabetes Brother    History   Social History  . Marital Status: Single    Spouse Name: N/A    Number of Children: N/A  . Years of Education: N/A   Social History Main Topics  . Smoking status: Never Smoker   . Smokeless tobacco: Never Used  . Alcohol Use: No  . Drug Use: Yes    Special: "Crack" cocaine, Cocaine  . Sexually Active: No     Comment: accepted condoms   Other Topics Concern  . None   Social History Narrative  . None   Review of Systems: General: no fevers, chills, changes in weight, changes in appetite Skin: no rash HEENT: no blurry vision, hearing changes, sore throat Pulm: Per  history of present illness CV: no chest pain, palpitations, shortness of breath Abd: no abdominal pain, nausea/vomiting, diarrhea/constipation GU: no dysuria, hematuria, polyuria Ext: Reports knee pain is improved Neuro: no weakness, numbness, or tingling   Objective:  Physical Exam: Filed Vitals:   10/21/12 1509  BP: 110/66  Pulse: 72  Temp: 97.4 F (36.3 C)  TempSrc: Oral  Weight: 183 lb 1.6 oz (83.054 kg)  SpO2: 98%   Constitutional: Vital signs reviewed.  Patient is a well-developed and well-nourished man in no acute distress and cooperative with exam.  Mouth: no erythema or exudates, MMM Eyes: PERRL, EOMI, conjunctivae normal, No scleral icterus.  Neck: Supple, Trachea midline normal ROM, No JVD, mass, thyromegaly, or carotid bruit present.  Cardiovascular: RRR, S1 normal, S2 normal, no MRG, pulses symmetric and intact bilaterally Pulmonary/Chest: Bilateral expiratory wheezing Abdominal: Soft. Non-tender, non-distended, bowel sounds are normal, no masses, organomegaly, or guarding present.  Musculoskeletal: No joint deformities, erythema, or stiffness, ROM full and nontender Neurological: A&O x3, Strength is normal and symmetric bilaterally, cranial nerve II-XII are grossly intact, no focal motor deficit Skin: Warm, dry and intact. No rash, cyanosis, or clubbing.  Psychiatric: Normal mood and affect. speech and behavior is normal. Judgment and thought content normal. Cognition and memory are normal.   Assessment & Plan:  Case and care discussed with Dr. Ellwood Dense. Please see problem-oriented charting for further details. Patient to follow up in 3 months for diabetes, but sooner if needed.

## 2012-10-21 NOTE — Patient Instructions (Addendum)
-  Please DO NOT take Glimeperide; continue lantus 20 units before bed; hold all meal time insulin for now and continue checking sugars  -Please call our clinic with the name and number of your drug rehab program [phone])  -You may take albuterol, 2 puffs, every 6 hours as needed for shortness of breath  Please be sure to bring all of your medications with you to every visit.  Should you have any new or worsening symptoms, please be sure to call the clinic at [phone].

## 2012-10-21 NOTE — Assessment & Plan Note (Addendum)
Though patient does have suspected history of fibromyalgia, this distribution of pain is new (bilateral posterior thighs) and exacerbating factor of worsening with walking and improvement with rest is new, and therefore I would like to rule out organic etiology at this time. I would like to repeat a CMP and CK since patient is on a statin and I would also like to monitor for electrolyte abnormalities. We'll check magnesium and vitamin D as well. Finally if symptoms worsen or fail to improve, may consider an ABI to rule out peripheral vascular disease.  Unfortunately patient left before labs could be drawn, will try to contact patient to return for lab draw  ADDENDUM: (10/23/11) attempted to call patient but number on file is disconnected.  CMA Adline Peals aware as well and will try to find a way to contact patient.

## 2012-10-21 NOTE — Assessment & Plan Note (Addendum)
Blood sugars significantly improved since last visit. He has been taking Lantus 20 units before bed, but is not taking mealtime insulin anymore. On my medicine reconciliation, Glimeperide appears to have been filled 2 days ago. We have discontinued this medication since he was on insulin. I suspect that he's been getting this medication at the rehabilitation center. Since he has had a few blood sugars in the 60s, I have told him to be sure that he discontinue this medication. All of his blood sugars have been less than 200. He will return in 3 months for followup; he will continue to check his blood sugar

## 2012-11-02 IMAGING — CR DG CHEST 2V
2 series · 2 of 2 positions shown · non-contrast
Comparison: 08/26/2011

CLINICAL DATA: Shortness of breath, left-sided chest pain.

CHEST - 2 VIEW

[w chest pa]
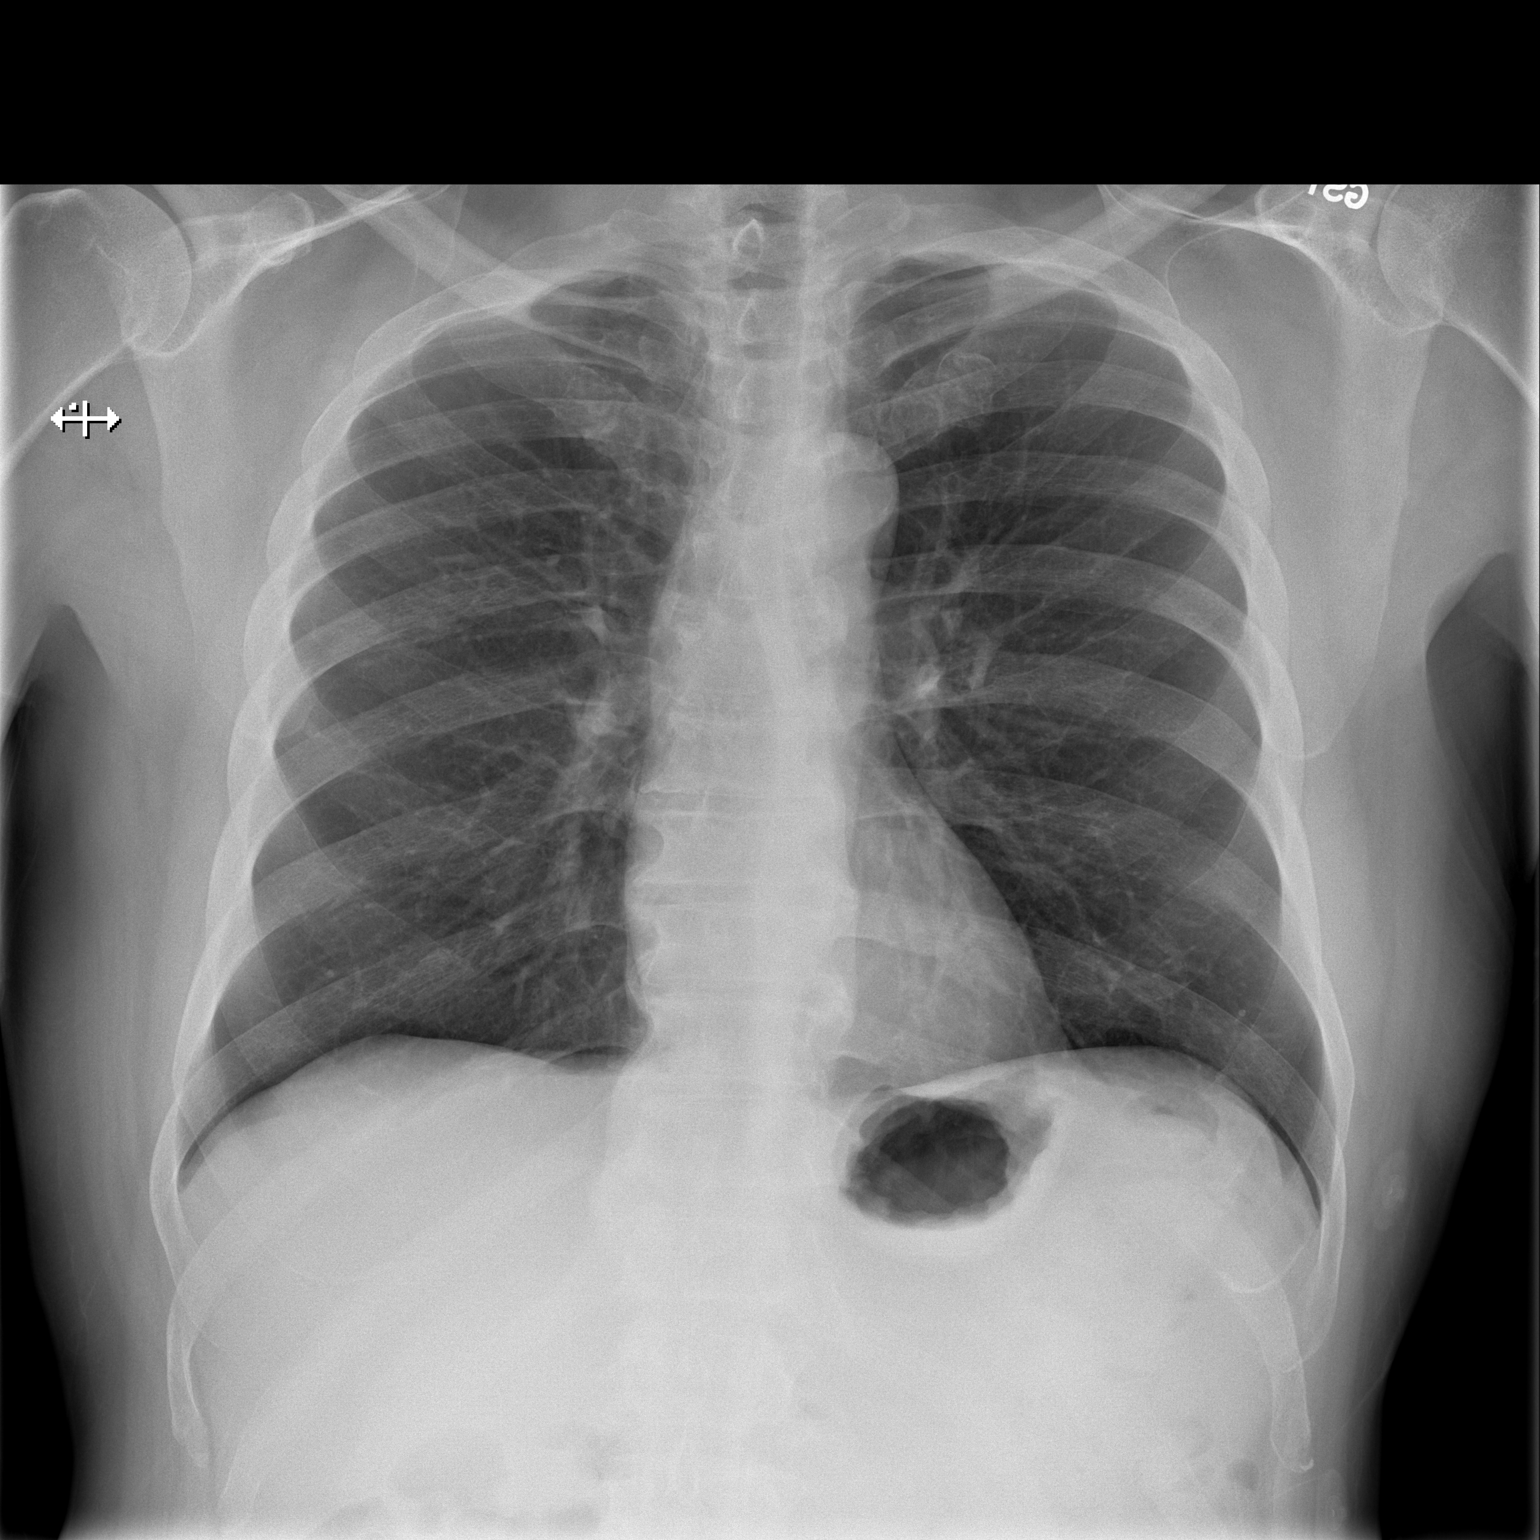

[w chest lat]
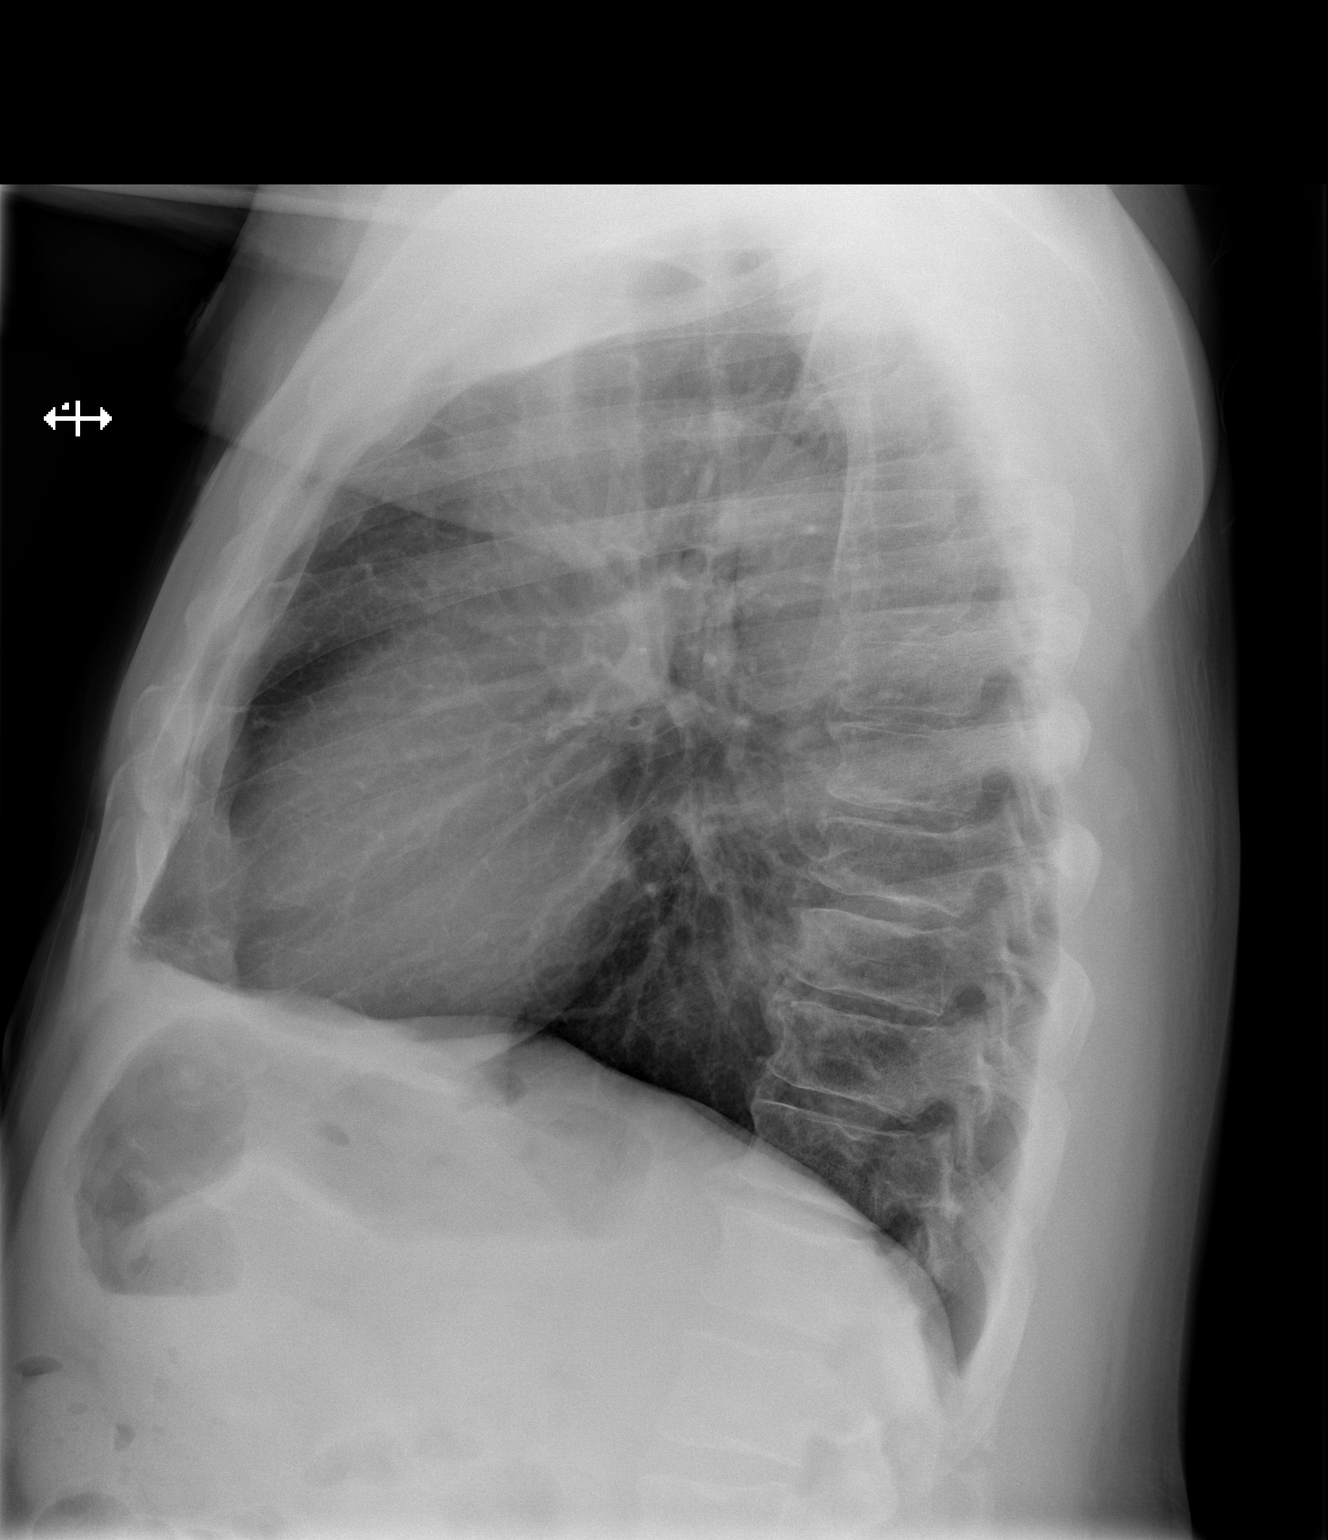

[2 of 2 positions shown; findings below may reference images not displayed]

FINDINGS: Lungs are clear. No pleural effusion or pneumothorax. The
cardiomediastinal contours are within normal limits. The visualized
bones and soft tissues are without significant appreciable
abnormality.
IMPRESSION: No acute cardiopulmonary process.

## 2012-11-09 ENCOUNTER — Ambulatory Visit: Payer: Self-pay | Admitting: Infectious Diseases

## 2012-11-13 ENCOUNTER — Ambulatory Visit (INDEPENDENT_AMBULATORY_CARE_PROVIDER_SITE_OTHER): Payer: Medicare Other | Admitting: Infectious Diseases

## 2012-11-13 ENCOUNTER — Other Ambulatory Visit: Payer: Self-pay

## 2012-11-13 ENCOUNTER — Ambulatory Visit (HOSPITAL_COMMUNITY)
Admission: RE | Admit: 2012-11-13 | Discharge: 2012-11-13 | Disposition: A | Discharge status: Home / Self Care | Payer: Medicare Other | Source: Ambulatory Visit | Attending: Infectious Diseases | Admitting: Infectious Diseases

## 2012-11-13 ENCOUNTER — Encounter: Payer: Self-pay | Admitting: Infectious Diseases

## 2012-11-13 VITALS — BP 141/91 | HR 83 | Temp 97.5°F | Ht 67.5 in | Wt 185.0 lb

## 2012-11-13 DIAGNOSIS — E785 Hyperlipidemia, unspecified: Secondary | ICD-10-CM

## 2012-11-13 DIAGNOSIS — Z23 Encounter for immunization: Secondary | ICD-10-CM

## 2012-11-13 DIAGNOSIS — Z113 Encounter for screening for infections with a predominantly sexual mode of transmission: Secondary | ICD-10-CM

## 2012-11-13 DIAGNOSIS — Z79899 Other long term (current) drug therapy: Secondary | ICD-10-CM

## 2012-11-13 DIAGNOSIS — IMO0002 Reserved for concepts with insufficient information to code with codable children: Secondary | ICD-10-CM

## 2012-11-13 DIAGNOSIS — I499 Cardiac arrhythmia, unspecified: Secondary | ICD-10-CM | POA: Insufficient documentation

## 2012-11-13 DIAGNOSIS — N529 Male erectile dysfunction, unspecified: Secondary | ICD-10-CM

## 2012-11-13 DIAGNOSIS — E1142 Type 2 diabetes mellitus with diabetic polyneuropathy: Secondary | ICD-10-CM

## 2012-11-13 DIAGNOSIS — I4891 Unspecified atrial fibrillation: Secondary | ICD-10-CM | POA: Insufficient documentation

## 2012-11-13 DIAGNOSIS — F1410 Cocaine abuse, uncomplicated: Secondary | ICD-10-CM

## 2012-11-13 DIAGNOSIS — E1149 Type 2 diabetes mellitus with other diabetic neurological complication: Secondary | ICD-10-CM

## 2012-11-13 DIAGNOSIS — B20 Human immunodeficiency virus [HIV] disease: Secondary | ICD-10-CM

## 2012-11-13 DIAGNOSIS — E1140 Type 2 diabetes mellitus with diabetic neuropathy, unspecified: Secondary | ICD-10-CM

## 2012-11-13 DIAGNOSIS — Z21 Asymptomatic human immunodeficiency virus [HIV] infection status: Secondary | ICD-10-CM

## 2012-11-13 NOTE — Addendum Note (Signed)
Addended by: Myrtis Hopping A on: 11/13/2012 11:31 AM   Modules accepted: Orders

## 2012-11-13 NOTE — Assessment & Plan Note (Signed)
Cont his current meds, repeat labs at f/u visit.

## 2012-11-13 NOTE — Assessment & Plan Note (Signed)
Will ask him to f/u with IM re: increasing his dose of viagra. There is no problem with this in regards to his complera.

## 2012-11-13 NOTE — Assessment & Plan Note (Signed)
Appears to be improved. For now

## 2012-11-13 NOTE — Progress Notes (Signed)
  Subjective:    Patient ID: Michael [name], male    DOB: 08-Oct-1950, 62 y.o.   MRN: [id number]  HPI 62 yo M with newly Dx HIV+ (CD4 350 VL 44,500 November 2012). Started on complera. Has DM2 as well. Had cocaine relapse. Has recently had a "cold", still has occas cough. Having difficulty with maintaining errectioms. Tried viagra once, didn't seem to help.  FSG's have been "good", has been eating right. Had ophtho last year.  No problems with complera. No missed.   HIV 1 RNA Quant (copies/mL)  Date Value  07/27/2012 <20   03/04/2012 47*  12/10/2011 1503*     CD4 T Cell Abs (cmm)  Date Value  07/27/2012 360*  03/04/2012 350*  12/10/2011 360*      Review of Systems  Constitutional: Negative for appetite change and unexpected weight change.  Respiratory: Positive for cough.   Genitourinary: Negative for dysuria.  Neurological: Positive for numbness. Negative for headaches.       Chronic numbness L hand.        Objective:   Physical Exam  Constitutional: He appears well-developed and well-nourished.  HENT:  Mouth/Throat: No oropharyngeal exudate.  Eyes: EOM are normal. Pupils are equal, round, and reactive to light.  Neck: Neck supple.  Cardiovascular: Normal rate and normal heart sounds.  An irregularly irregular rhythm present.  Pulmonary/Chest: Effort normal and breath sounds normal.  Abdominal: Soft. Bowel sounds are normal. There is no tenderness.  Musculoskeletal: He exhibits no edema.       Feet:  Lymphadenopathy:    He has no cervical adenopathy.          Assessment & Plan:

## 2012-11-13 NOTE — Assessment & Plan Note (Signed)
Will f/u with IM, greatly appreciate partnering with them and Dr Raenette Rover outstanding care.

## 2012-11-13 NOTE — Assessment & Plan Note (Signed)
i suspected he had a-fib but his ECG shows "sinus rhythm with marked sinus arrythmia". Will have him f/u in IM

## 2012-11-13 NOTE — Assessment & Plan Note (Signed)
He appears to be doing well. Will continue his current meds. He is offered/refuses condoms. He will start hep A series today. Needs RPR, lipids at next blood draw. Will see him back in 6 months with labs prior.

## 2012-11-16 NOTE — Addendum Note (Signed)
Addended by: Othella Boyer on: 11/16/2012 03:07 PM   Modules accepted: Orders

## 2012-11-26 ENCOUNTER — Other Ambulatory Visit: Payer: Self-pay | Admitting: Infectious Diseases

## 2012-11-26 DIAGNOSIS — B20 Human immunodeficiency virus [HIV] disease: Secondary | ICD-10-CM

## 2012-12-07 ENCOUNTER — Telehealth: Payer: Self-pay | Admitting: Dietician

## 2012-12-07 NOTE — Telephone Encounter (Signed)
He requests rxs be sent to [facility] on [address].

## 2012-12-07 NOTE — Telephone Encounter (Signed)
Patient requests all medicine Rx be sent to new pharmacy since his closed down. He has 5 pills left of one of his medicines. Will route this nore to triage.

## 2012-12-08 ENCOUNTER — Other Ambulatory Visit: Payer: Self-pay | Admitting: *Deleted

## 2012-12-08 NOTE — Telephone Encounter (Signed)
Talked with pt and to [facility] - they are to deliever his meds today - were unable to do 12/07/12 due to bad weather. Pt aware. There is no reason to change pharmacy at this time.

## 2012-12-26 IMAGING — CR DG ABDOMEN ACUTE W/ 1V CHEST
4 series · 4 of 4 positions shown · non-contrast
Comparison: 06/11/2009

CLINICAL DATA: Abdominal pain, weakness

ACUTE ABDOMEN SERIES (ABDOMEN 2 VIEW & CHEST 1 VIEW)

[w chest pa]
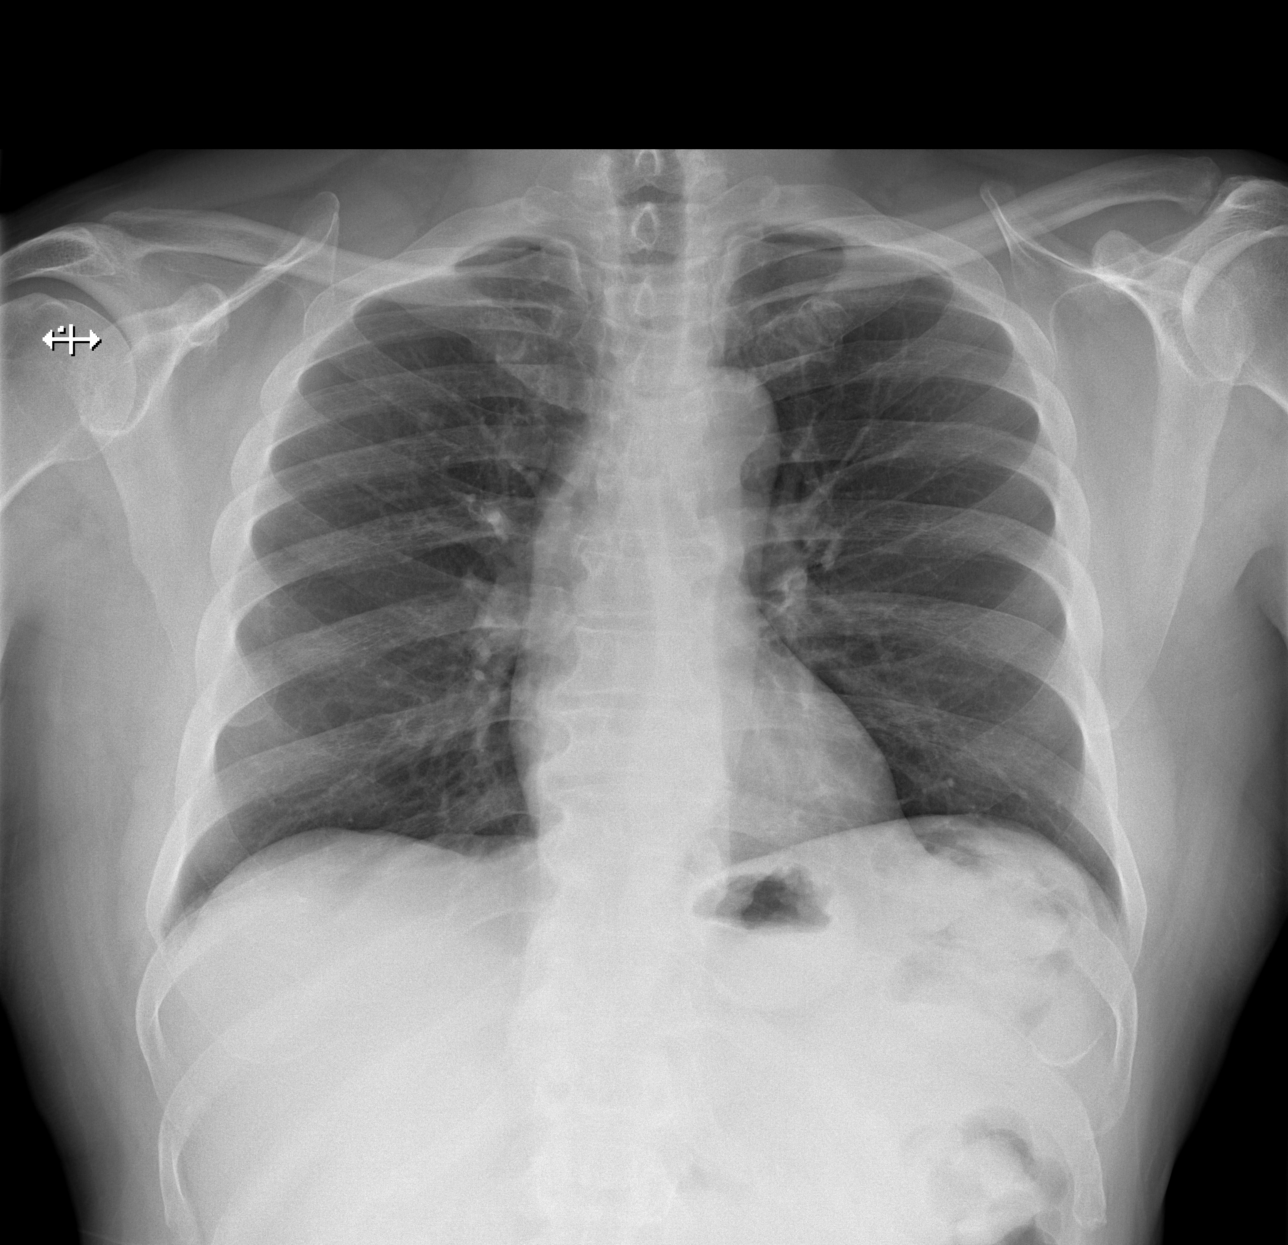

[w abdomen upright]
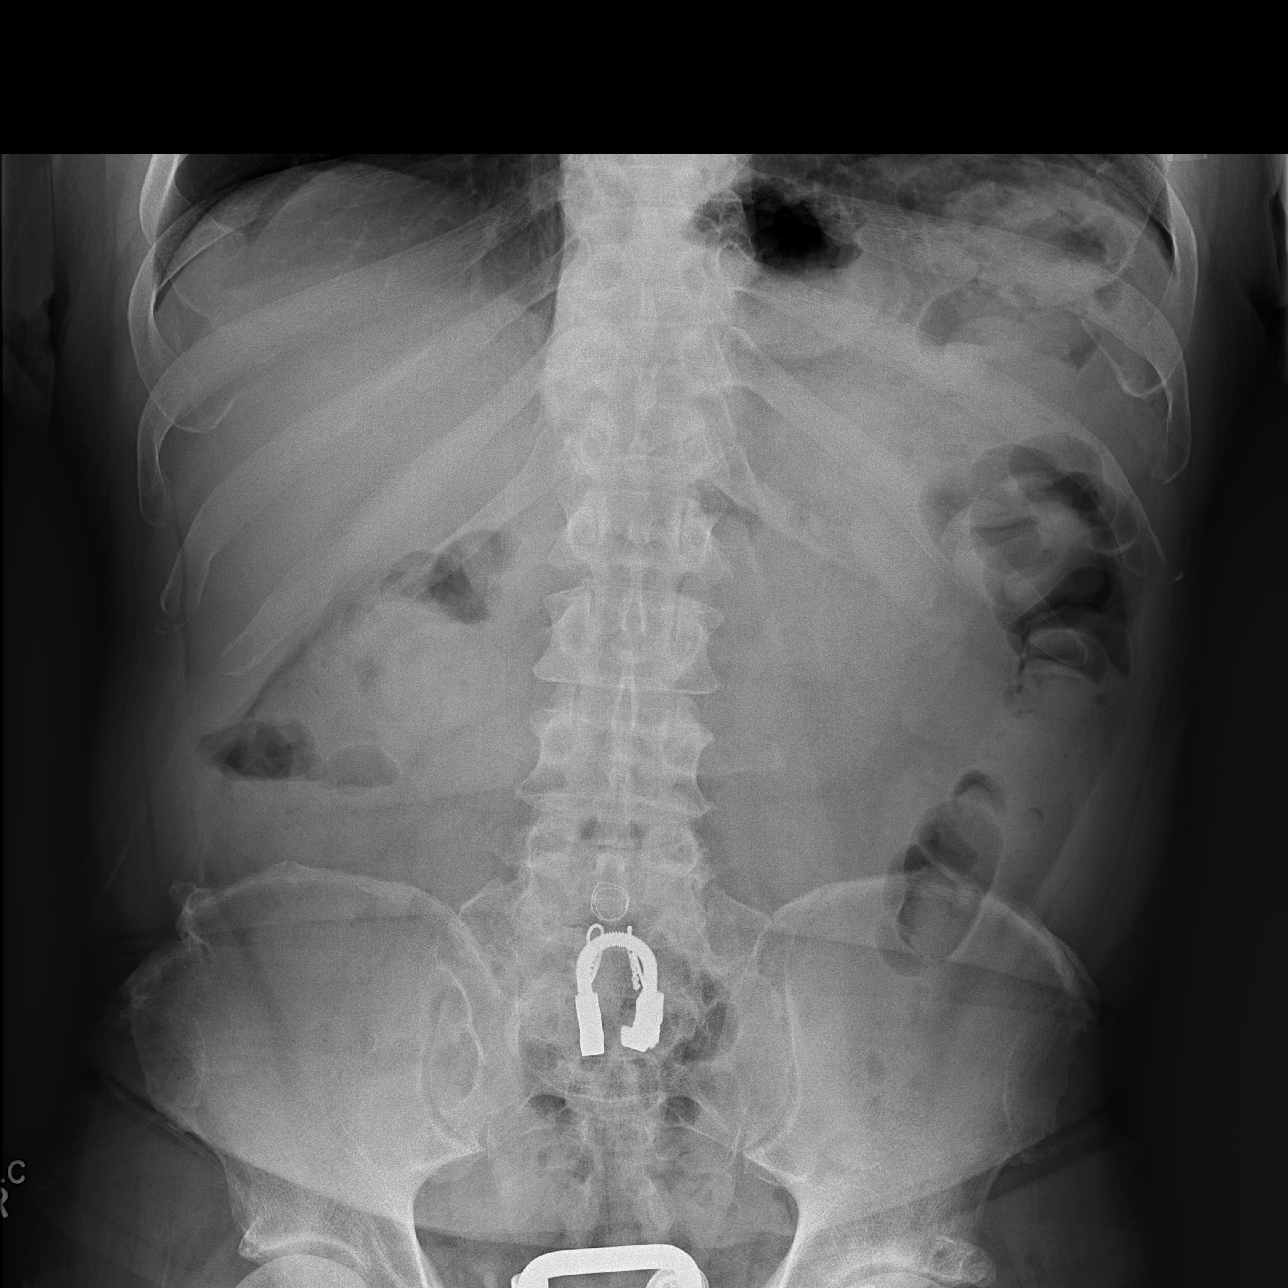

[t abdomen supine (1 of 2)]
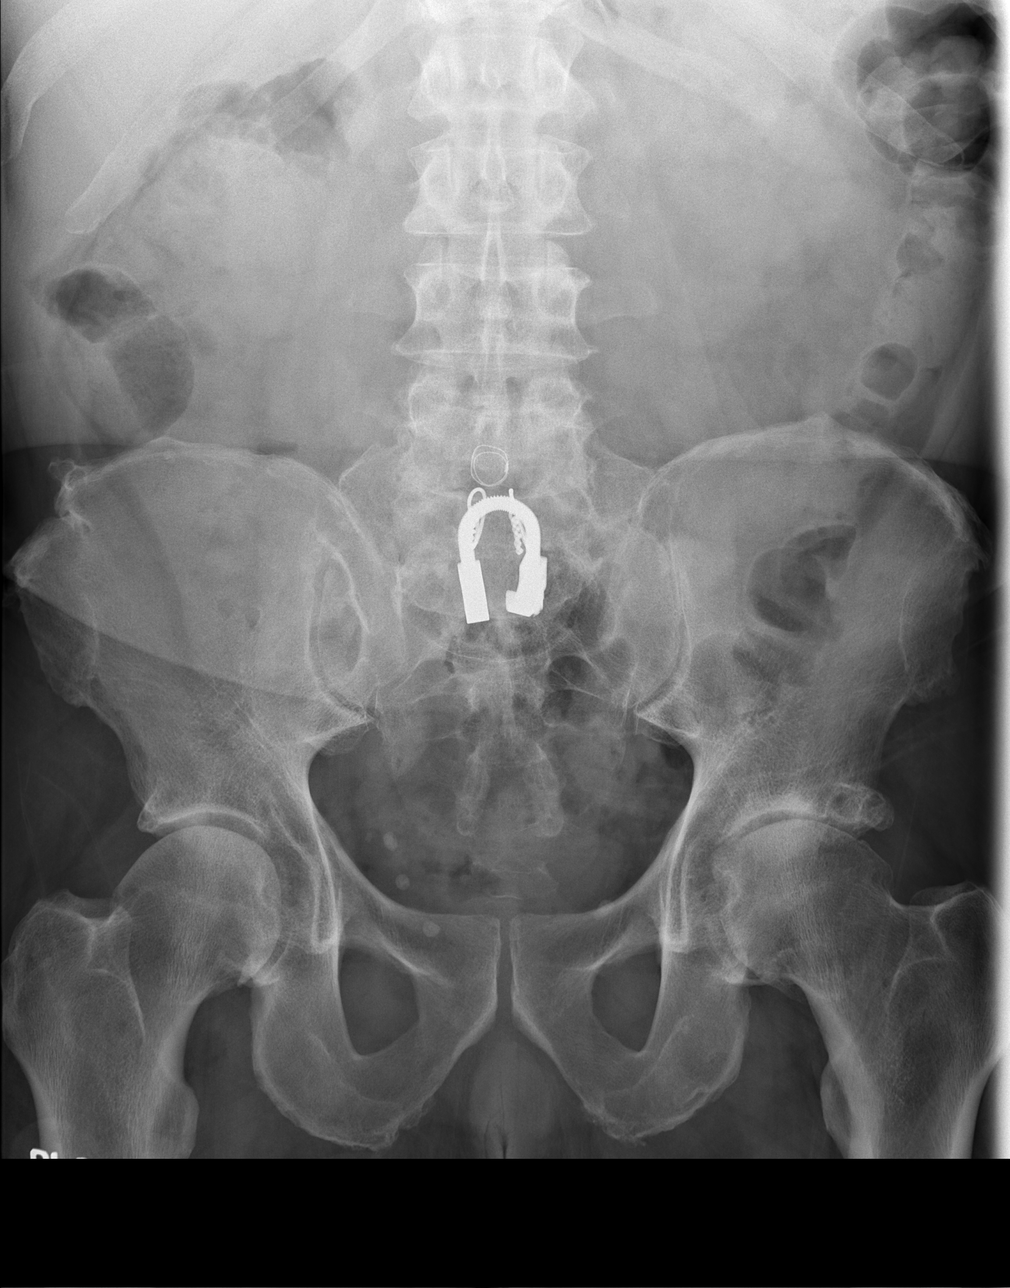

[t abdomen supine (2 of 2)]
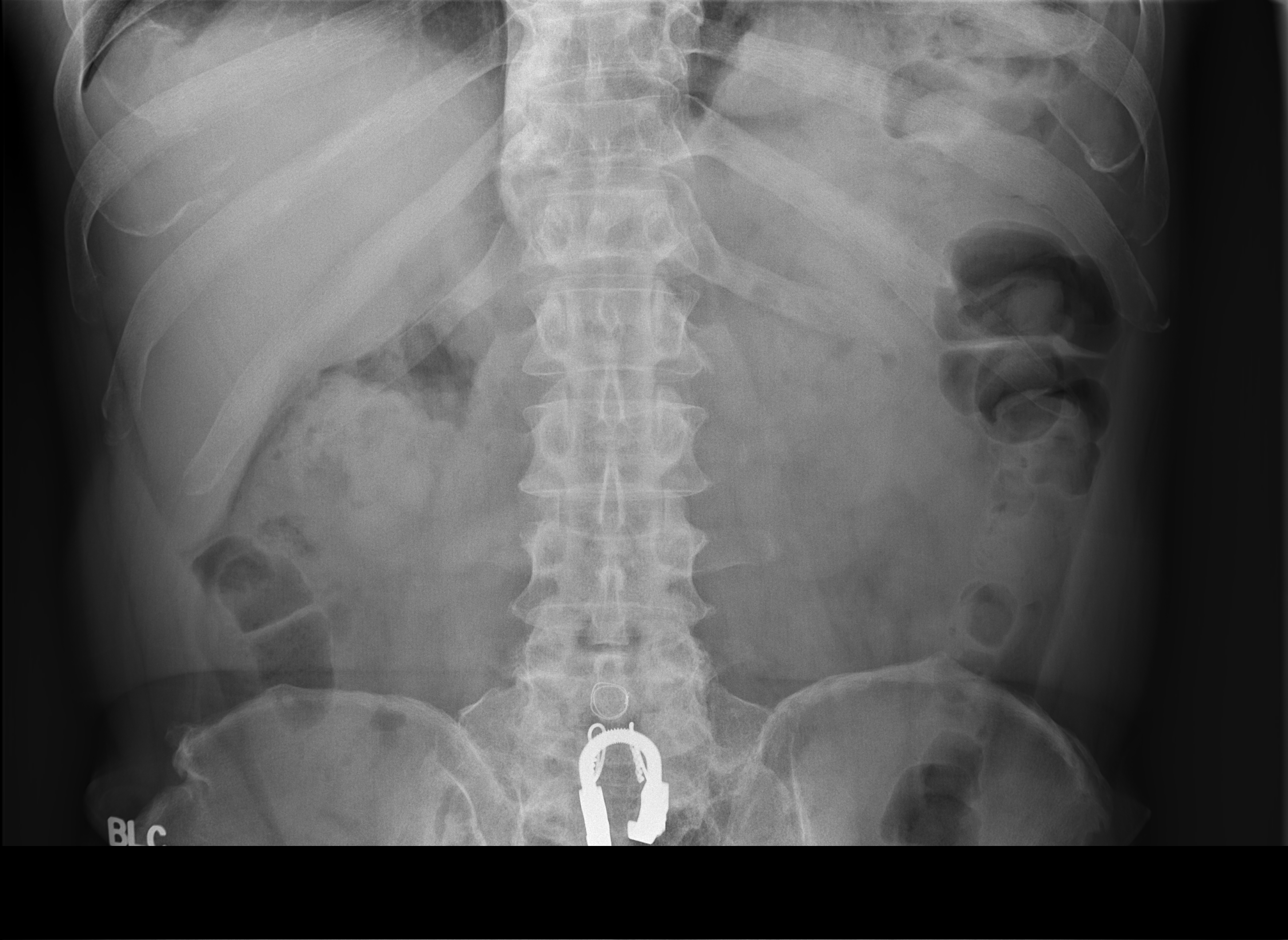

[4 of 4 positions shown; findings below may reference images not displayed]

FINDINGS: Cardiomediastinal silhouette is unremarkable.  No acute
infiltrate or pleural effusion.  No pulmonary edema.

There is a nonspecific nonobstructive bowel gas pattern.  Stable
postsurgical change in sacral region. Pelvic phleboliths are again
noted.  No free abdominal air.
IMPRESSION: No active disease.  No significant change.

## 2012-12-31 ENCOUNTER — Other Ambulatory Visit: Payer: Self-pay | Admitting: Internal Medicine

## 2013-01-05 ENCOUNTER — Encounter: Payer: Self-pay | Admitting: *Deleted

## 2013-01-13 IMAGING — CR DG CHEST 2V
2 series · 2 of 2 positions shown · non-contrast
Comparison: September 24, 2011

CLINICAL DATA: Chest pain

CHEST - 2 VIEW

[x chest ap]
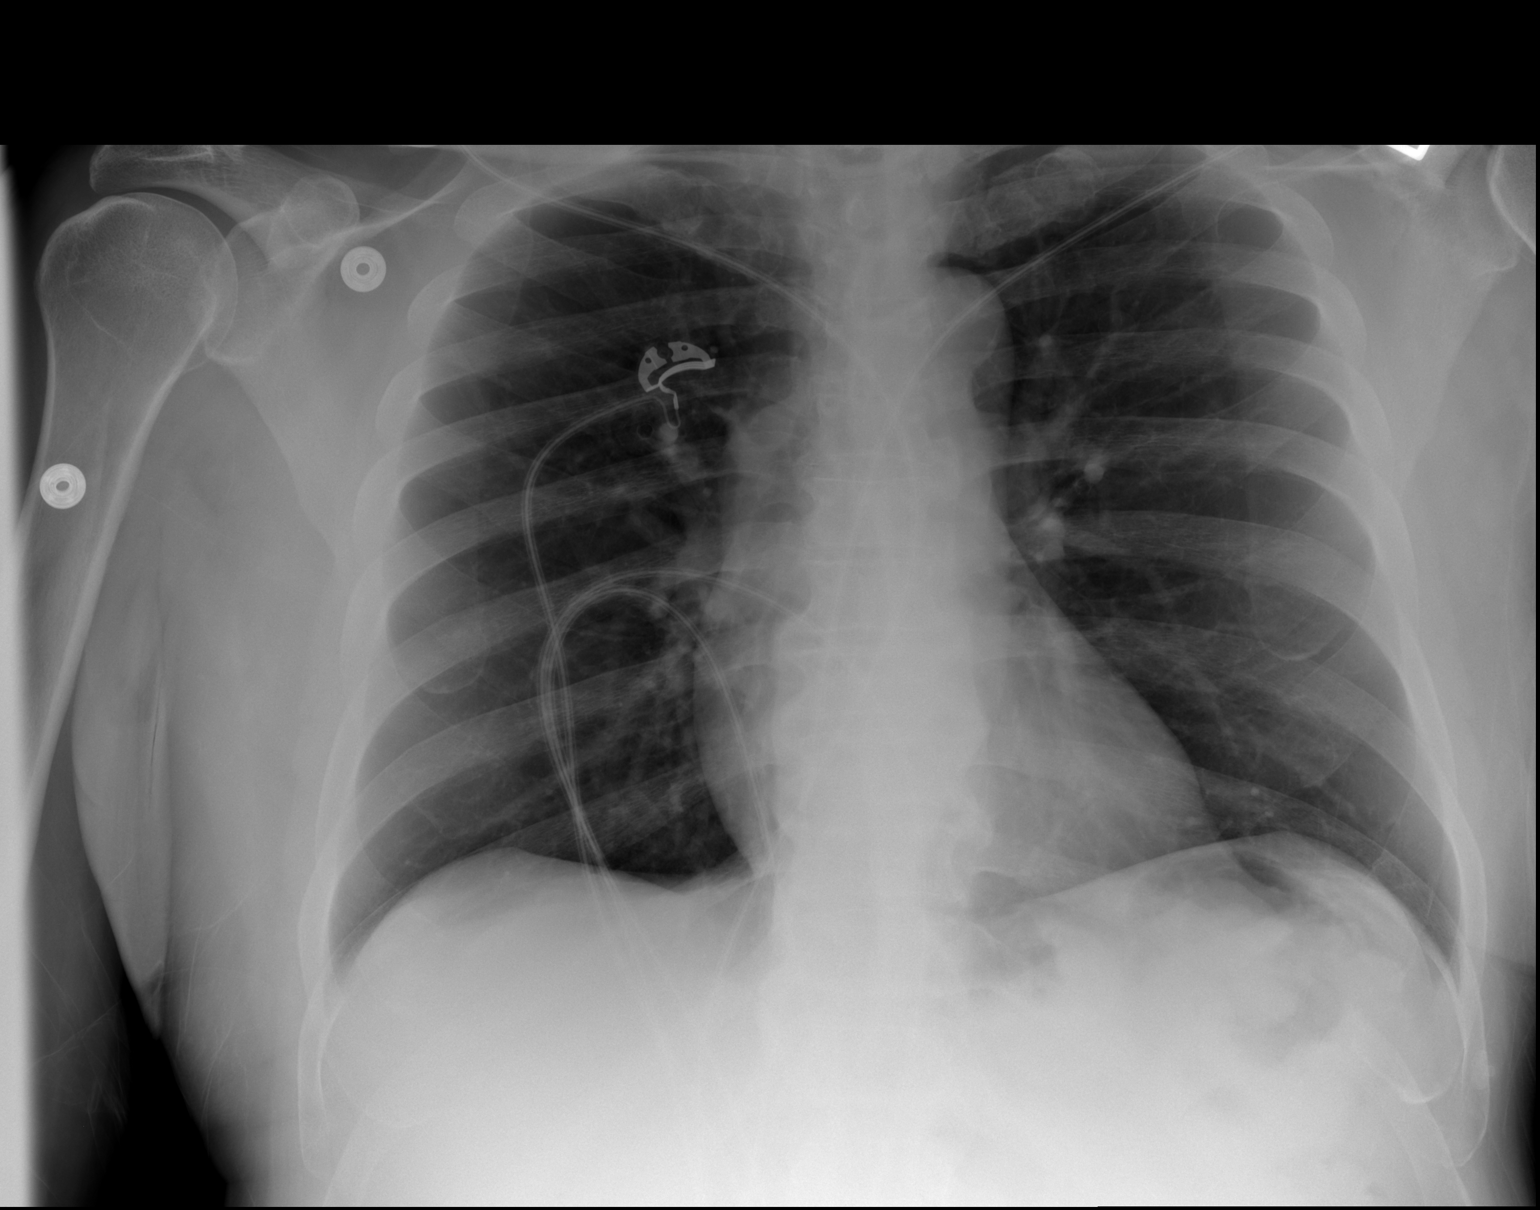

[w chest lat]
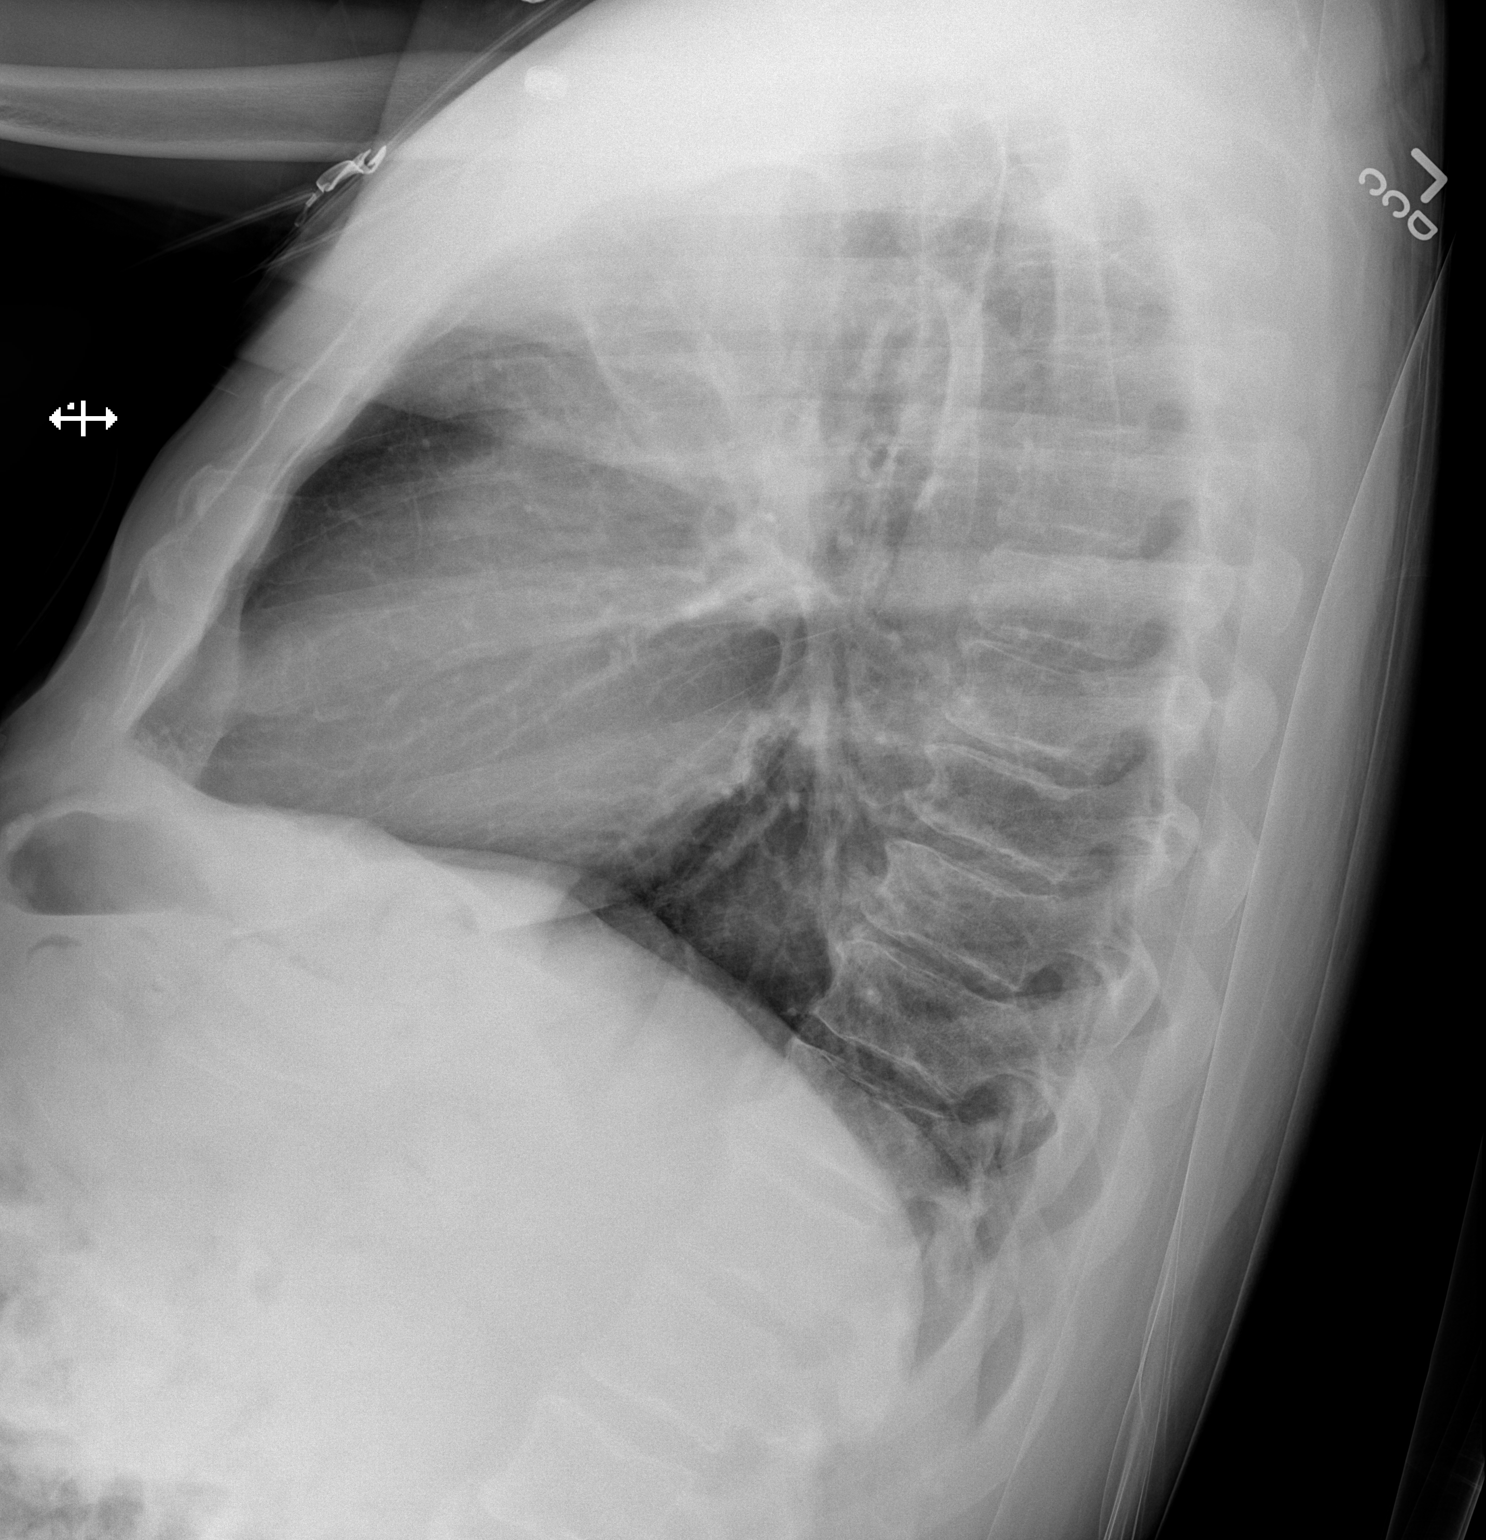

[2 of 2 positions shown; findings below may reference images not displayed]

FINDINGS: The cardiac silhouette, mediastinum, pulmonary
vasculature are within normal limits.  Both lungs are clear.
There is no acute bony abnormality.
IMPRESSION: There is no evidence of acute cardiac or pulmonary process.

## 2013-03-03 ENCOUNTER — Other Ambulatory Visit: Payer: Self-pay | Admitting: Internal Medicine

## 2013-03-10 ENCOUNTER — Other Ambulatory Visit: Payer: Self-pay | Admitting: *Deleted

## 2013-03-10 MED ORDER — METOPROLOL SUCCINATE ER 25 MG PO TB24: 25.0000 mg | ORAL_TABLET | Freq: Every day | ORAL | Status: DC

## 2013-03-30 ENCOUNTER — Encounter: Payer: Self-pay | Admitting: Internal Medicine

## 2013-03-31 ENCOUNTER — Encounter: Payer: Self-pay | Admitting: Internal Medicine

## 2013-04-07 ENCOUNTER — Other Ambulatory Visit: Payer: Medicare Other

## 2013-04-07 ENCOUNTER — Ambulatory Visit (INDEPENDENT_AMBULATORY_CARE_PROVIDER_SITE_OTHER): Payer: Medicare Other | Admitting: Internal Medicine

## 2013-04-07 ENCOUNTER — Encounter: Payer: Self-pay | Admitting: Internal Medicine

## 2013-04-07 VITALS — BP 135/75 | HR 89 | Temp 97.5°F | Wt 178.4 lb

## 2013-04-07 DIAGNOSIS — M791 Myalgia, unspecified site: Secondary | ICD-10-CM

## 2013-04-07 DIAGNOSIS — IMO0002 Reserved for concepts with insufficient information to code with codable children: Secondary | ICD-10-CM

## 2013-04-07 DIAGNOSIS — Z0000 Encounter for general adult medical examination without abnormal findings: Secondary | ICD-10-CM

## 2013-04-07 DIAGNOSIS — E1140 Type 2 diabetes mellitus with diabetic neuropathy, unspecified: Secondary | ICD-10-CM

## 2013-04-07 DIAGNOSIS — IMO0001 Reserved for inherently not codable concepts without codable children: Secondary | ICD-10-CM

## 2013-04-07 DIAGNOSIS — R059 Cough, unspecified: Secondary | ICD-10-CM

## 2013-04-07 DIAGNOSIS — R05 Cough: Secondary | ICD-10-CM

## 2013-04-07 DIAGNOSIS — B20 Human immunodeficiency virus [HIV] disease: Secondary | ICD-10-CM

## 2013-04-07 DIAGNOSIS — N529 Male erectile dysfunction, unspecified: Secondary | ICD-10-CM

## 2013-04-07 DIAGNOSIS — E1149 Type 2 diabetes mellitus with other diabetic neurological complication: Secondary | ICD-10-CM

## 2013-04-07 DIAGNOSIS — Z1211 Encounter for screening for malignant neoplasm of colon: Secondary | ICD-10-CM

## 2013-04-07 DIAGNOSIS — Z113 Encounter for screening for infections with a predominantly sexual mode of transmission: Secondary | ICD-10-CM

## 2013-04-07 DIAGNOSIS — E1142 Type 2 diabetes mellitus with diabetic polyneuropathy: Secondary | ICD-10-CM

## 2013-04-07 DIAGNOSIS — I499 Cardiac arrhythmia, unspecified: Secondary | ICD-10-CM

## 2013-04-07 DIAGNOSIS — Z79899 Other long term (current) drug therapy: Secondary | ICD-10-CM

## 2013-04-07 DIAGNOSIS — I10 Essential (primary) hypertension: Secondary | ICD-10-CM

## 2013-04-07 LAB — GLUCOSE, CAPILLARY: Glucose-Capillary: 230 mg/dL — ABNORMAL HIGH (ref 70–99)

## 2013-04-07 LAB — CBC
Hemoglobin: 11.6 g/dL — ABNORMAL LOW (ref 13.0–17.0)
MCH: 27.8 pg (ref 26.0–34.0)
RBC: 4.18 MIL/uL — ABNORMAL LOW (ref 4.22–5.81)
WBC: 3.1 10*3/uL — ABNORMAL LOW (ref 4.0–10.5)

## 2013-04-07 LAB — POCT GLYCOSYLATED HEMOGLOBIN (HGB A1C): Hemoglobin A1C: 7.2

## 2013-04-07 MED ORDER — SILDENAFIL CITRATE 50 MG PO TABS: 50.0000 mg | ORAL_TABLET | ORAL | Status: DC | PRN

## 2013-04-07 MED ORDER — INSULIN GLARGINE 100 UNIT/ML ~~LOC~~ SOLN: SUBCUTANEOUS | Status: DC

## 2013-04-07 NOTE — Patient Instructions (Addendum)
General Instructions:  -Today we increased your lantus (insulin) to 32u before bed (this is a 2unit increase from your usual dose) -Today, we collected urine to make sure you do not have any protein in your urine. -We are going to schedule you for a screening colonoscopy -Please try to schedule your eye exam, otherwise we will discuss this again at your next visit. -Be sure to have your labs drawn today at Dr. Algis Downs office -Tell [name] about your cough to see  -I sent in a refill of your viagra at a higher dose (50mg ) -I am so happy to hear that you are doing so well!!!!  Keep it up!!!  Please be sure to bring all of your medications and your glucose meter with you to every visit.  Should you have any new or worsening symptoms, please be sure to call the clinic at [phone].   Treatment Goals:  Goals (1 Years of Data) as of 04/07/13         As of Today 11/13/12 10/21/12 10/09/12 10/07/12     Blood Pressure    . Blood Pressure < 140/90  135/75 141/91 110/66 120/79 125/70     Result Component    . HEMOGLOBIN A1C < 7.0  7.2   7.2     . LDL CALC < 100            Progress Toward Treatment Goals:  Treatment Goal 04/07/2013  Hemoglobin A1C at goal  Blood pressure at goal    Self Care Goals & Plans:  Self Care Goal 04/07/2013  Manage my medications take my medicines as prescribed; refill my medications on time  Monitor my health keep track of my blood glucose; bring my glucose meter and log to each visit  Eat healthy foods drink diet soda or water instead of juice or soda; eat foods that are low in salt; eat baked foods instead of fried foods  Be physically active find an activity I enjoy    Home Blood Glucose Monitoring 04/07/2013  Check my blood sugar 2 times a day  When to check my blood sugar before breakfast; at bedtime     Care Management & Community Referrals:  Referral 10/09/2012  Referrals made for care management support diabetes educator

## 2013-04-07 NOTE — Assessment & Plan Note (Signed)
Resolved without intervention

## 2013-04-07 NOTE — Assessment & Plan Note (Addendum)
Lab Results  Component Value Date   HGBA1C 7.2 04/07/2013   HGBA1C 7.2 10/09/2012   HGBA1C 9.0* 05/13/2012     Assessment: Diabetes control: good control (HgbA1C at goal) Progress toward A1C goal:  at goal (mildly above goal)  Plan: Medications:  increase lantus to 32u , urine microalb today Home glucose monitoring: Frequency: 2 times a day Timing: before breakfast;at bedtime Instruction/counseling given: reminded to bring blood glucose meter & log to each visit and reminded to bring medications to each visit Educational resources provided: brochure

## 2013-04-07 NOTE — Assessment & Plan Note (Signed)
Increased viagra to 50mg 

## 2013-04-07 NOTE — Assessment & Plan Note (Signed)
Referral for screening colonoscopy

## 2013-04-07 NOTE — Assessment & Plan Note (Signed)
BP Readings from Last 3 Encounters:  04/07/13 135/75  11/13/12 141/91  10/21/12 110/66    Lab Results  Component Value Date   NA 145 07/27/2012   K 3.9 07/27/2012   CREATININE 1.06 07/27/2012    Assessment: Blood pressure control: controlled Progress toward BP goal:  at goal  Plan: Medications:  continue current medications - appears to be taking lisinopril & diltiazem, but uncertain about metoprolol - asked that he bring meds to next visit; labs to be drawn at [facility] clinic today Educational resources provided: brochure Self management tools provided: home blood pressure logbook

## 2013-04-07 NOTE — Progress Notes (Signed)
Subjective:   Patient ID: Michael [name] male   DOB: Jun 27, 1951 62 y.o.   MRN: [id number]  HPI: [name] is a 62 y.o. man with h/o DM, HTN, HIV, depression and cocaine abuse who presents for routine follow up and medication refills.  I last saw him in clinic in January. Att hat visit, we were supposed to draw labs (CMP, CK, Mg, Vit D) for worsening myalgias, but he left before having labs drawn and hasn't returned since.   Has been doing well since last visit; in cocaine recovery, [facility] [place]. Last relapse was 2 months ago.  Has made changes to people and places.  Since last visit, muscle aches have resolved, does not need voltaren gel anymore.  Only pain he has is when walks for 45 min -1 hr in his left shoulder.  He didn't know he could use voltaren for that pain.  That pain happens about twice a week.  Forgot to bring meter today, but sugars have been doing well.  Reports CBGs avg 90s-120s.  Rarely up to 160s due to sweet tooth. No episodes of hypoglycemia causing palpitations, diaphoresis, sob.  No polyuria or polydipsia.   Takes lantus 30u qHS, does not take day time insulin.  Cough 2-3x/day, tickle in throat, no metal taste in mouth, denies indigestion/heart burn  Due for colonoscopy, lipid panel, eye exam, urine microalb  Needs albuterol refill   Past Medical History  Diagnosis Date  . Diabetes mellitus     Diagnosed in 2002, started insulin in 2012  . Hypertension   . Headache(784.0)     CT head 08/2011: Periventricular and subcortical white matter hypodensities are most in keeping with chronic microangiopathic change  . Gout   . Hyperlipidemia   . HIV infection Nov 2012    Followed by [name]  . Crack cocaine use     for 20+ years, has been enrolled in detox programs in the past  . Chondromalacia of medial femoral condyle     Left knee MRI 04/28/12: Chondromalacia of the medial femoral condyle with slight peripheral  degeneration of the meniscocapsular junction of the medial meniscus; followed by sports medicine  . Asthma     No PFTs, history of childhood asthma  . Depression     with history of hospitalization for suicidal ideation   Current Outpatient Prescriptions  Medication Sig Dispense Refill  . allopurinol (ZYLOPRIM) 100 MG tablet Take 200 mg by mouth every morning. For gout.      Marland Kitchen amantadine (SYMMETREL) 100 MG capsule       . aspirin 81 MG chewable tablet Chew 81 mg by mouth daily. For blood thinner.      . COMPLERA 200-25-300 MG TABS TAKE 1 TABLET BY MOUTH DAILY  30 tablet  5  . diltiazem (CARDIZEM CD) 240 MG 24 hr capsule Take 240 mg by mouth daily.      . DULoxetine (CYMBALTA) 60 MG capsule Take 60 mg by mouth daily.      Marland Kitchen gabapentin (NEURONTIN) 300 MG capsule TAKE 1 CAPSULE BY MOUTH THREE   TIMES A DAY  90 capsule  0  . insulin aspart (NOVOLOG) 100 UNIT/ML injection Inject 4units before breakfast & lunch; Inject 8units before dinner  30 mL  0  . insulin glargine (LANTUS) 100 UNIT/ML injection Inject 20 units daily before bed  10 mL  3  . lisinopril (PRINIVIL,ZESTRIL) 5 MG tablet Take 1 tablet (5 mg total) by  mouth daily.  30 tablet  11  . metFORMIN (GLUCOPHAGE) 1000 MG tablet Take 1,000 mg by mouth 2 (two) times daily with a meal.      . metoprolol succinate (TOPROL-XL) 25 MG 24 hr tablet Take 1 tablet (25 mg total) by mouth daily.  30 tablet  2  . PARoxetine (PAXIL) 20 MG tablet Take 20 mg by mouth every morning.      . pravastatin (PRAVACHOL) 40 MG tablet TAKE 1 TABLET BY MOUTH EVERY EVENING  30 tablet  3  . PROVENTIL HFA 108 (90 BASE) MCG/ACT inhaler INHALE TWO   PUFFS INTO THE LUNGS EVERY 6 (SIX) HOURS AS NEEDED FOR WHEEZING.  6.7 g  5  . sildenafil (VIAGRA) 25 MG tablet Take 1 tablet (25 mg total) by mouth as needed for erectile dysfunction.  20 tablet  1  . traMADol (ULTRAM) 50 MG tablet       . VOLTAREN 1 % GEL AAPLY TO AFFECTED AREA 4 TIMES A DAY  100 g  0   No current  facility-administered medications for this visit.   Family History  Problem Relation Age of Onset  . Diabetes Mother   . Diabetes Father   . Diabetes Brother    History   Social History  . Marital Status: Single    Spouse Name: N/A    Number of Children: N/A  . Years of Education: N/A   Social History Main Topics  . Smoking status: Never Smoker   . Smokeless tobacco: Never Used  . Alcohol Use: No  . Drug Use: Yes    Special: "Crack" cocaine, Cocaine     Comment: last use 2 months ago  . Sexually Active: No     Comment: accepted condoms   Other Topics Concern  . None   Social History Narrative  . None    Objective:  Physical Exam: Filed Vitals:   04/07/13 1344  BP: 135/75  Pulse: 89  Temp: 97.5 F (36.4 C)  TempSrc: Oral  Weight: 178 lb 6.4 oz (80.922 kg)  SpO2: 99%   HEENT: PERRL, EOMI, no scleral icterus Cardiac: RRR, no rubs, murmurs or gallops Pulm: clear to auscultation bilaterally, moving normal volumes of air Abd: soft, nontender, nondistended, BS normoactive Ext: warm and well perfused, no pedal edema Neuro: alert and oriented X3, cranial nerves II-XII grossly intact  Assessment & Plan:   Case and care discussed with Dr. Lynnae January.  Please see problem oriented charting for further details. Patient to return in 3 months for DM & HTN follow up.

## 2013-04-07 NOTE — Assessment & Plan Note (Addendum)
He did not report palpitations/heart racing, and I did not see this problem until after he left. Will follow up at next visit.  Labs being drawn at [facility] today.

## 2013-04-08 LAB — T-HELPER CELL (CD4) - (RCID CLINIC ONLY)
CD4 % Helper T Cell: 37 % (ref 33–55)
CD4 T Cell Abs: 450 uL (ref 400–2700)

## 2013-04-08 LAB — HIV-1 RNA QUANT-NO REFLEX-BLD: HIV 1 RNA Quant: <20 copies/mL (ref <20)

## 2013-04-08 LAB — COMPREHENSIVE METABOLIC PANEL
Albumin: 4.2 g/dL (ref 3.5–5.2)
CO2: 26 mEq/L (ref 19–32)
Calcium: 9.4 mg/dL (ref 8.4–10.5)
Chloride: 107 mEq/L (ref 96–112)
Glucose, Bld: 147 mg/dL — ABNORMAL HIGH (ref 70–99)
Sodium: 139 mEq/L (ref 135–145)
Total Bilirubin: 0.3 mg/dL (ref 0.3–1.2)
Total Protein: 6.9 g/dL (ref 6.0–8.3)

## 2013-04-08 LAB — MICROALBUMIN / CREATININE URINE RATIO
Creatinine, Urine: 140.3 mg/dL
Microalb Creat Ratio: 11.9 mg/g (ref 0.0–30.0)
Microalb, Ur: 1.67 mg/dL (ref 0.00–1.89)

## 2013-04-08 LAB — LIPID PANEL
Cholesterol: 151 mg/dL (ref 0–200)
Triglycerides: 221 mg/dL — ABNORMAL HIGH (ref <150)
VLDL: 44 mg/dL — ABNORMAL HIGH (ref 0–40)

## 2013-04-08 NOTE — Progress Notes (Signed)
Case discussed with Dr. Sharda soon after the resident saw the patient.  We reviewed the resident's history and exam and pertinent patient test results.  I agree with the assessment, diagnosis, and plan of care documented in the resident's note. 

## 2013-04-12 IMAGING — CR DG KNEE COMPLETE 4+V*L*
4 series · 4 of 4 positions shown · non-contrast
Comparison: None.

CLINICAL DATA: 60-year-old male status post fall with pain.

LEFT KNEE - COMPLETE 4+ VIEW

[t knee ap left]
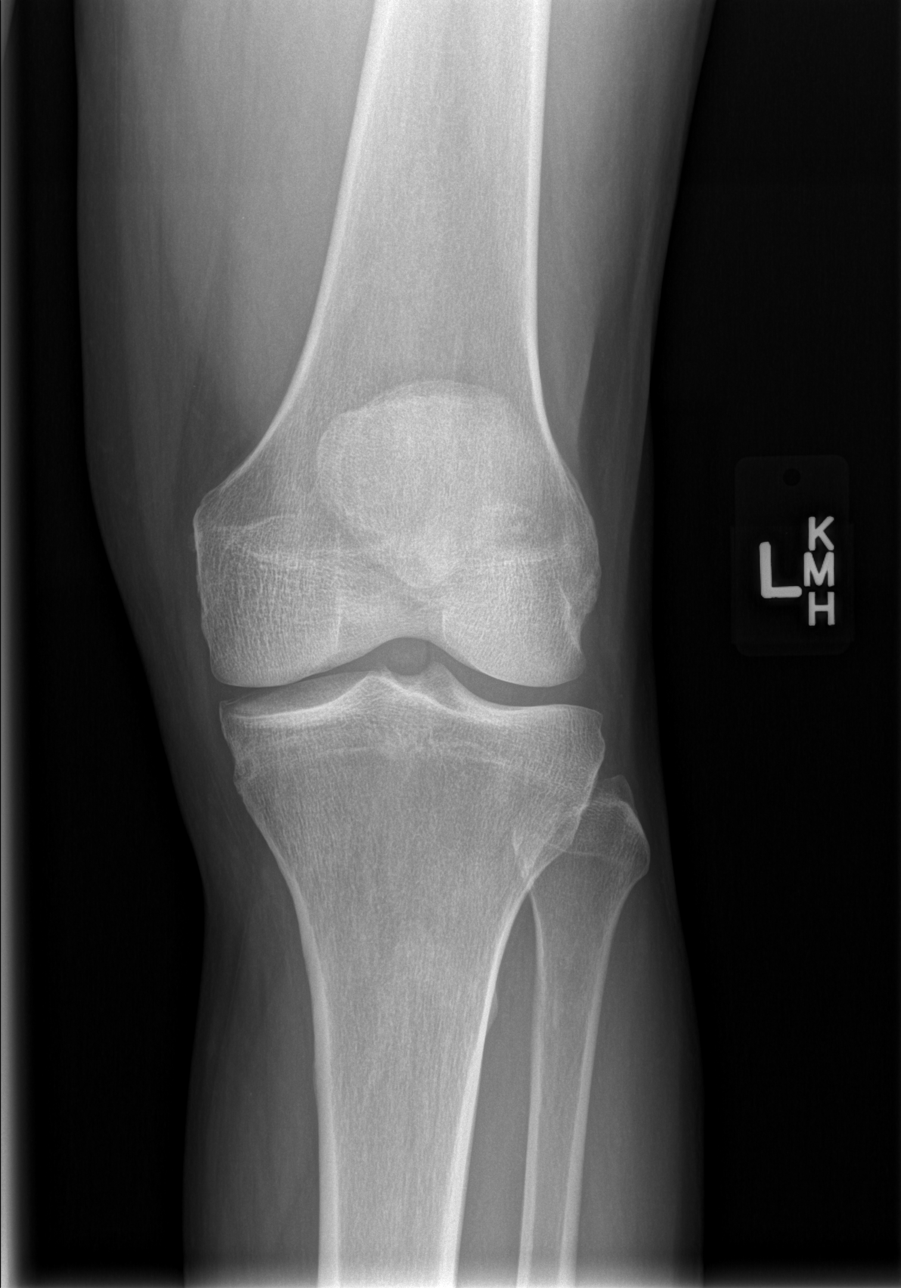

[t knee obl left (1 of 2)]
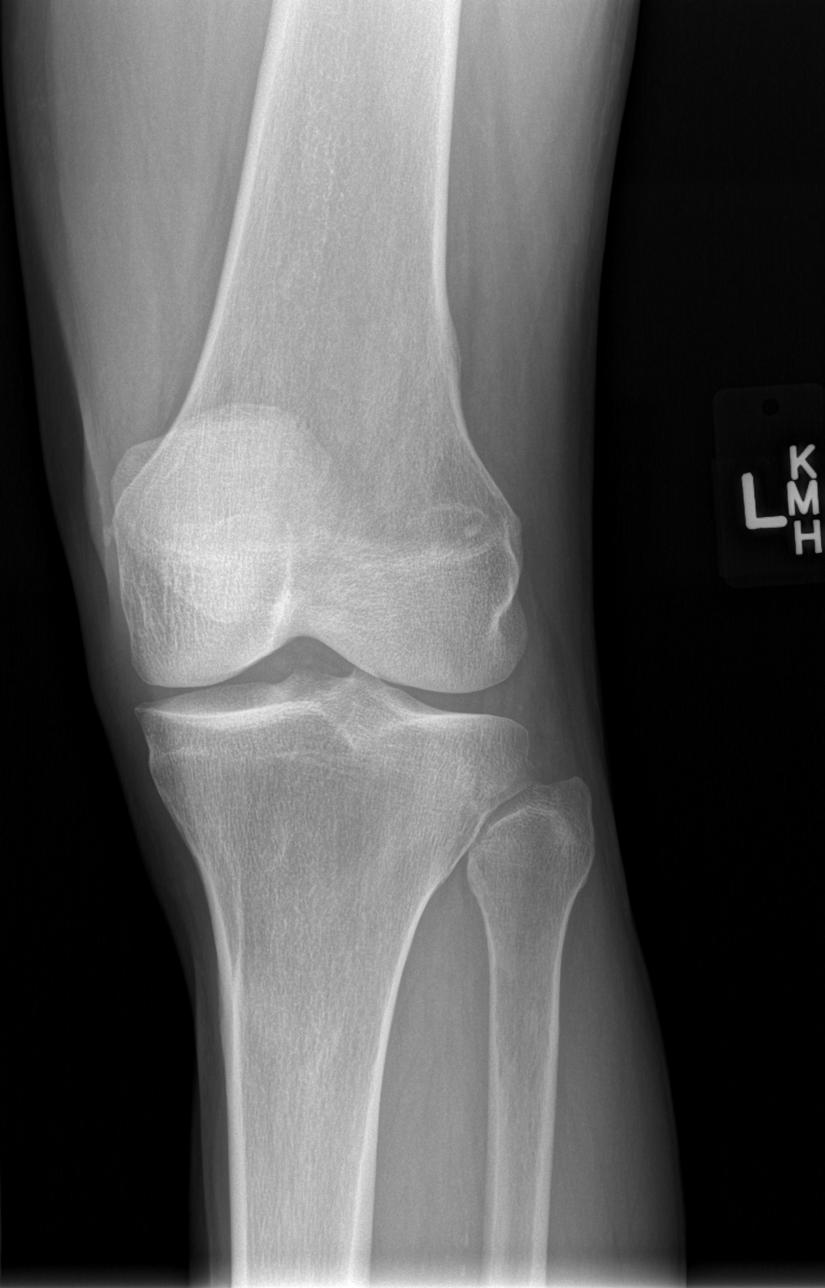

[t knee obl left (2 of 2)]
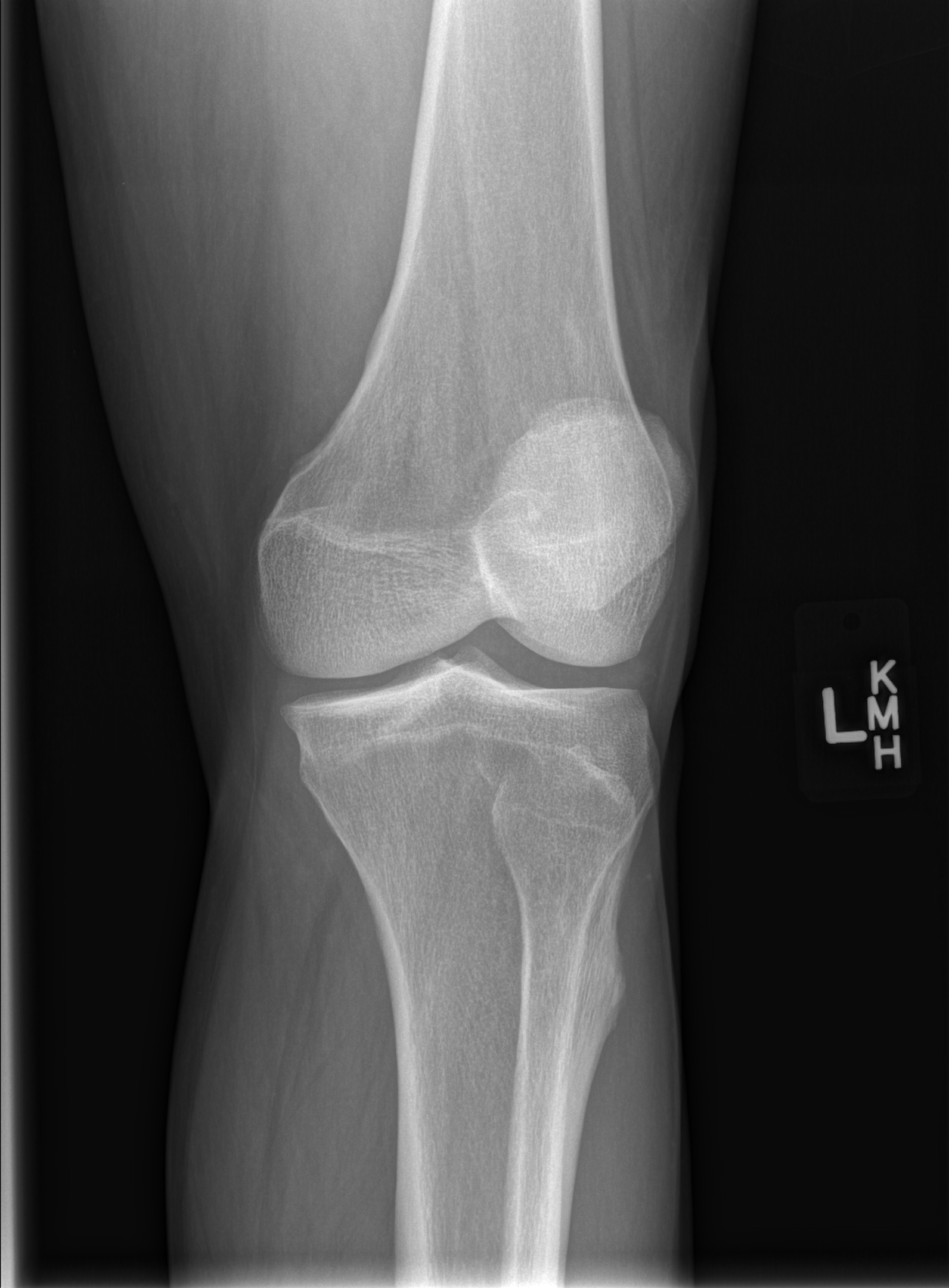

[t knee lat left]
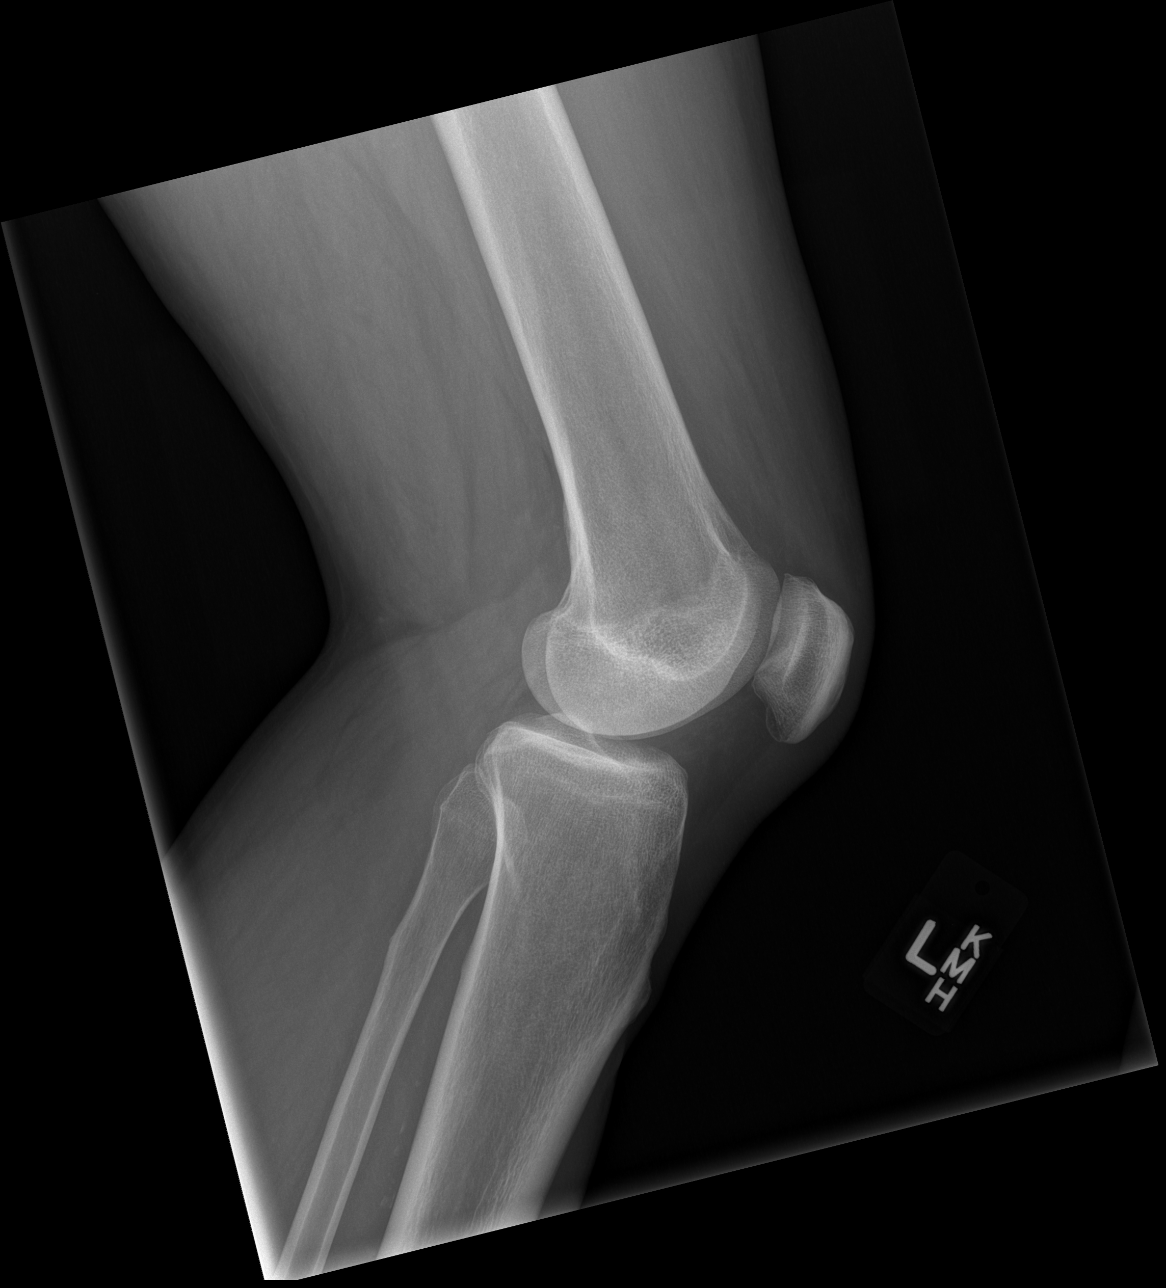

[4 of 4 positions shown; findings below may reference images not displayed]

FINDINGS: No joint effusion. Bone mineralization is within normal
limits.  Joint spaces are preserved.  Patella intact.  No acute
fracture.
IMPRESSION: No acute osseous abnormality identified about the left knee.

## 2013-04-12 IMAGING — CR DG ABDOMEN ACUTE W/ 1V CHEST
3 series · 3 of 3 positions shown · non-contrast
Comparison: 11/17/2011 and earlier.

CLINICAL DATA: 60-year-old male with vomiting abdominal pain.
Cough.

ACUTE ABDOMEN SERIES (ABDOMEN 2 VIEW & CHEST 1 VIEW)

[t abdomen supine]
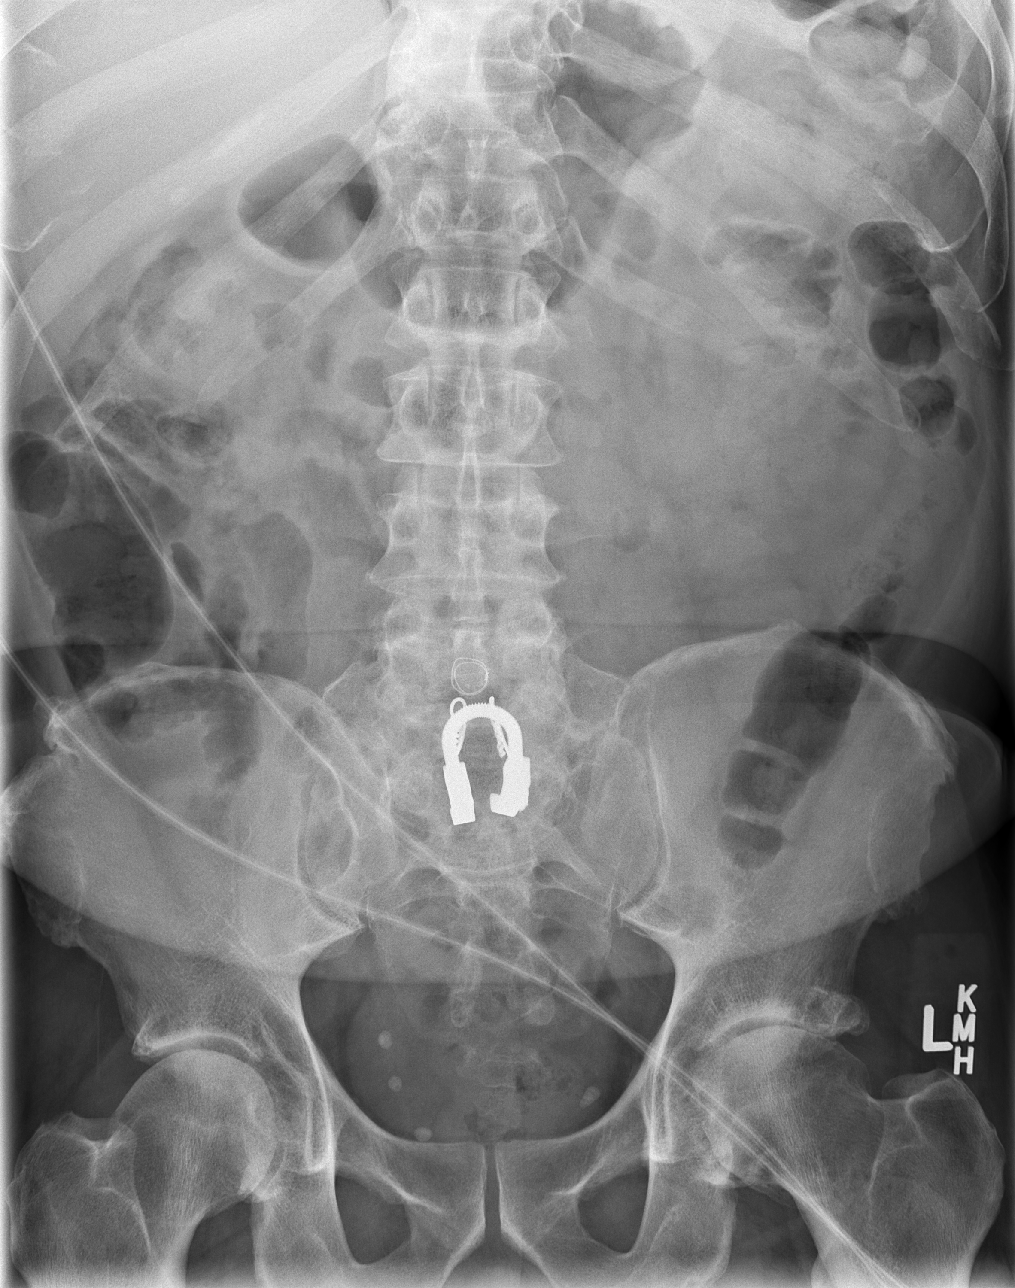

[w chest pa]
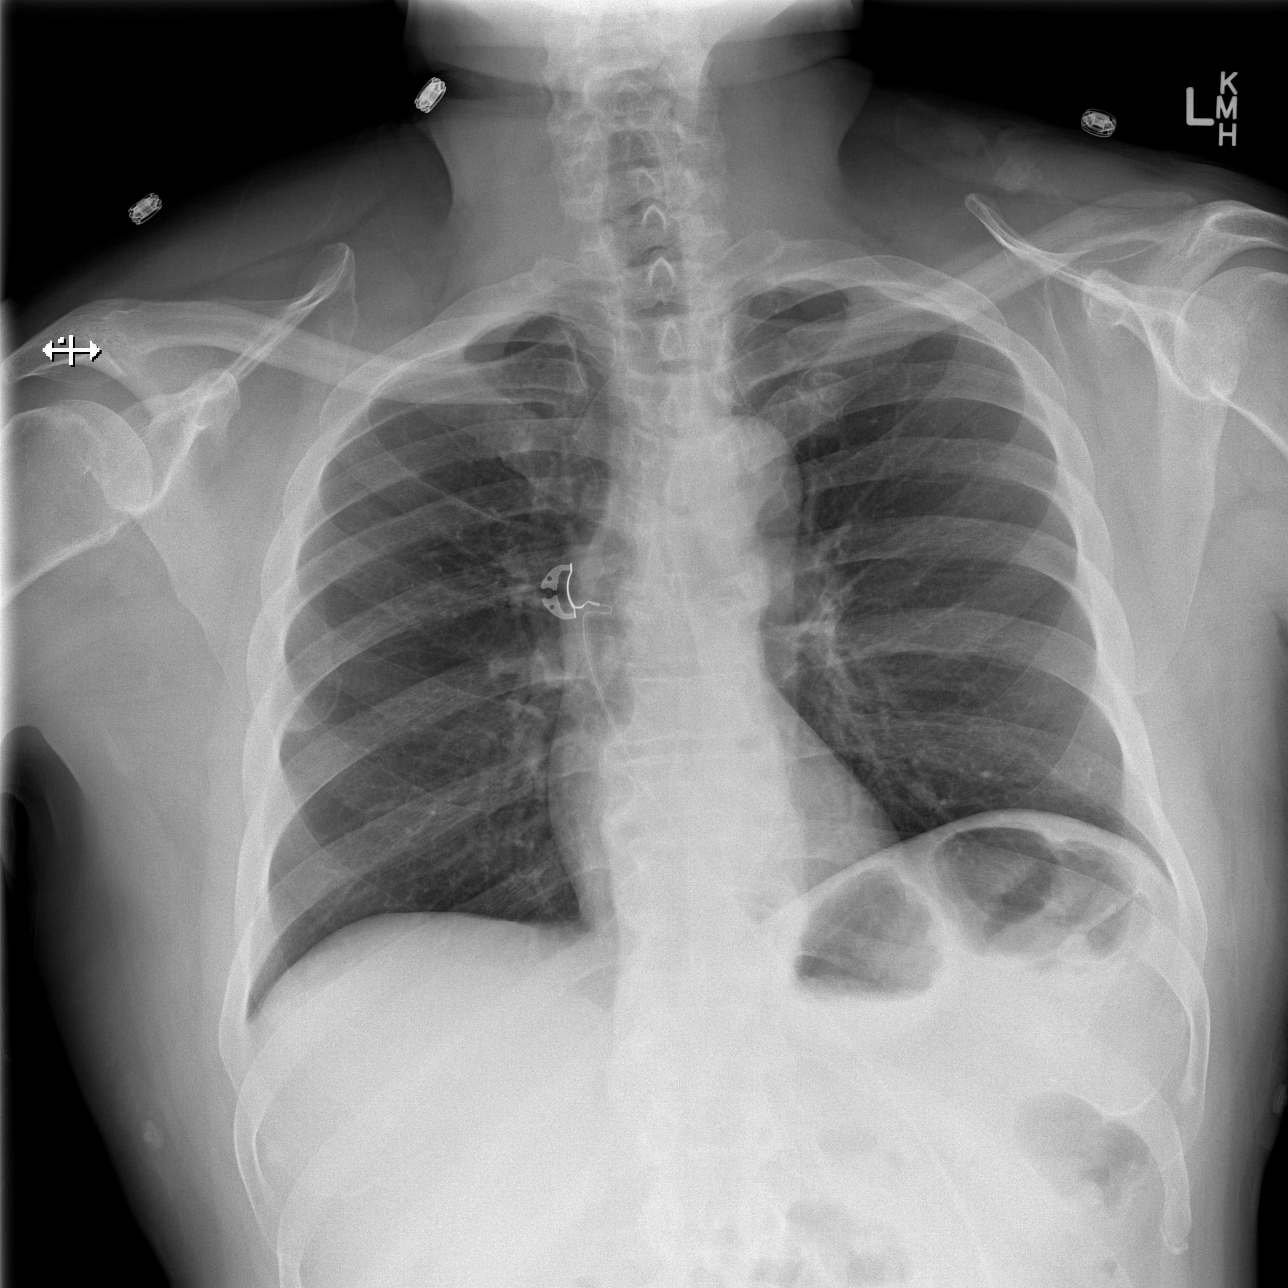

[w abdomen upright]
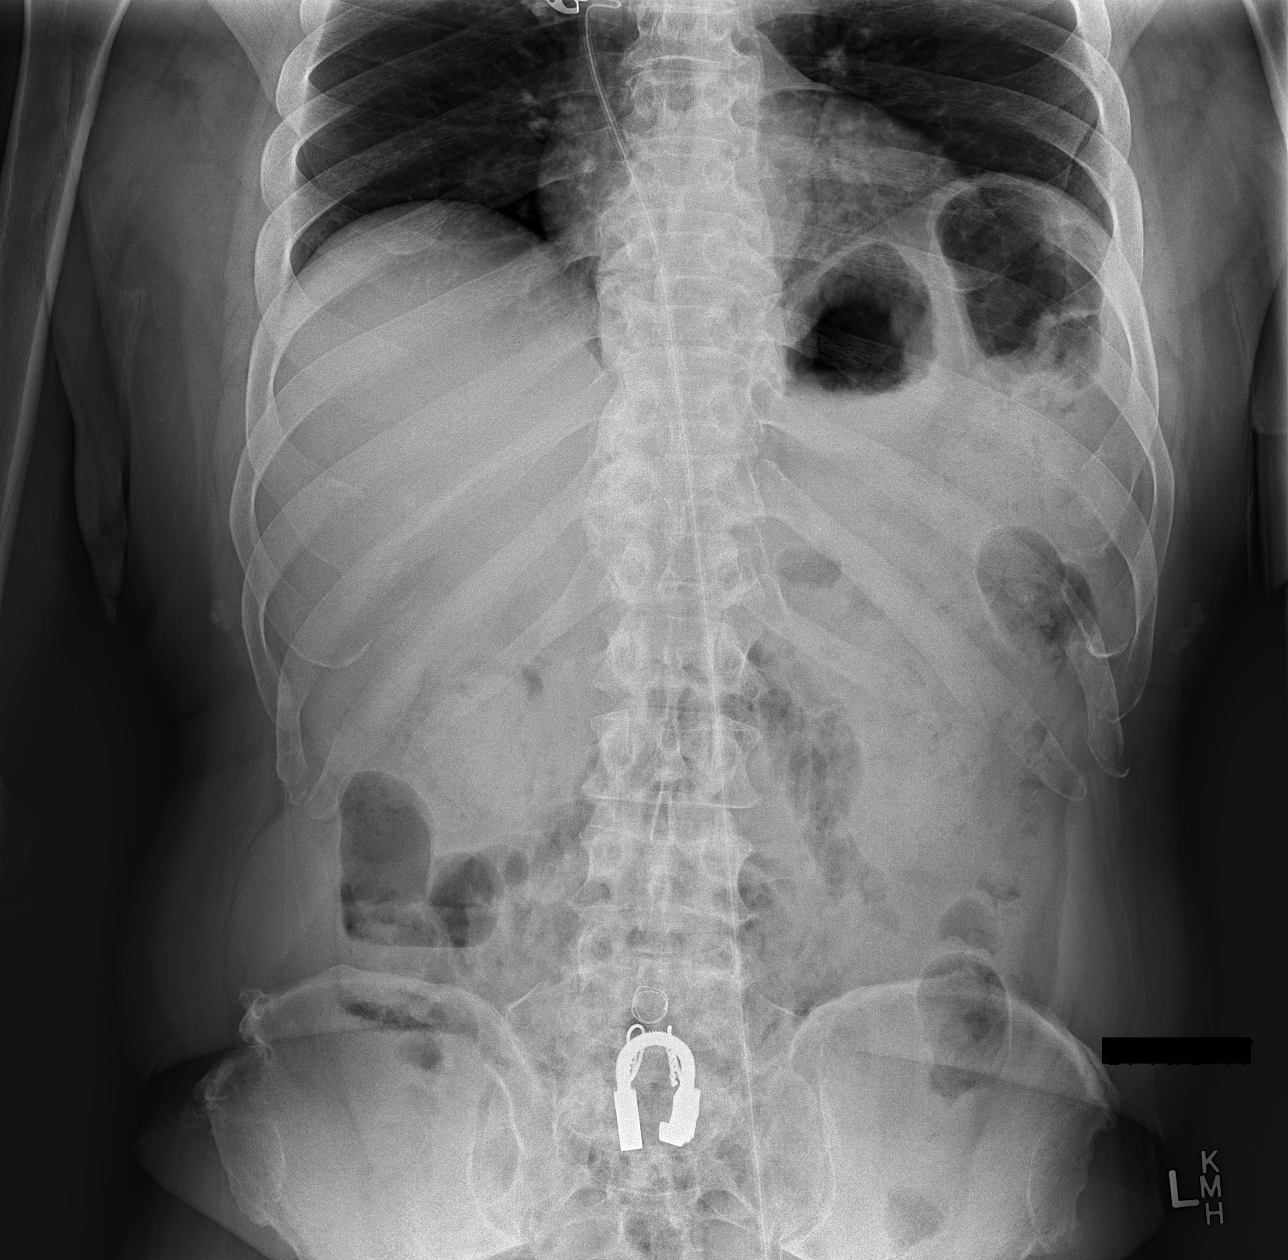

[3 of 3 positions shown; findings below may reference images not displayed]

FINDINGS: Stable lung volumes.  Cardiac size and mediastinal
contours are within normal limits.  Visualized tracheal air column
is within normal limits.  No pneumothorax or pneumoperitoneum.  The
lungs are clear.

Postoperative changes to the lumbosacral spine. Nonobstructed bowel
gas pattern.  Pelvic phleboliths again noted.  Abdominal and pelvic
visceral contours are stable.  Stable asymmetric left acetabular
sclerosis which appears to be degenerative.
IMPRESSION: Nonobstructed bowel gas pattern, no free air.  Negative chest.

## 2013-04-12 IMAGING — CR DG LUMBAR SPINE COMPLETE 4+V
5 series · 5 of 5 positions shown · non-contrast
Comparison: KUB 06/11/2009.

CLINICAL DATA: 60-year-old male status post fall with pain.

LUMBAR SPINE - COMPLETE 4+ VIEW

[t lumbar spine ap]
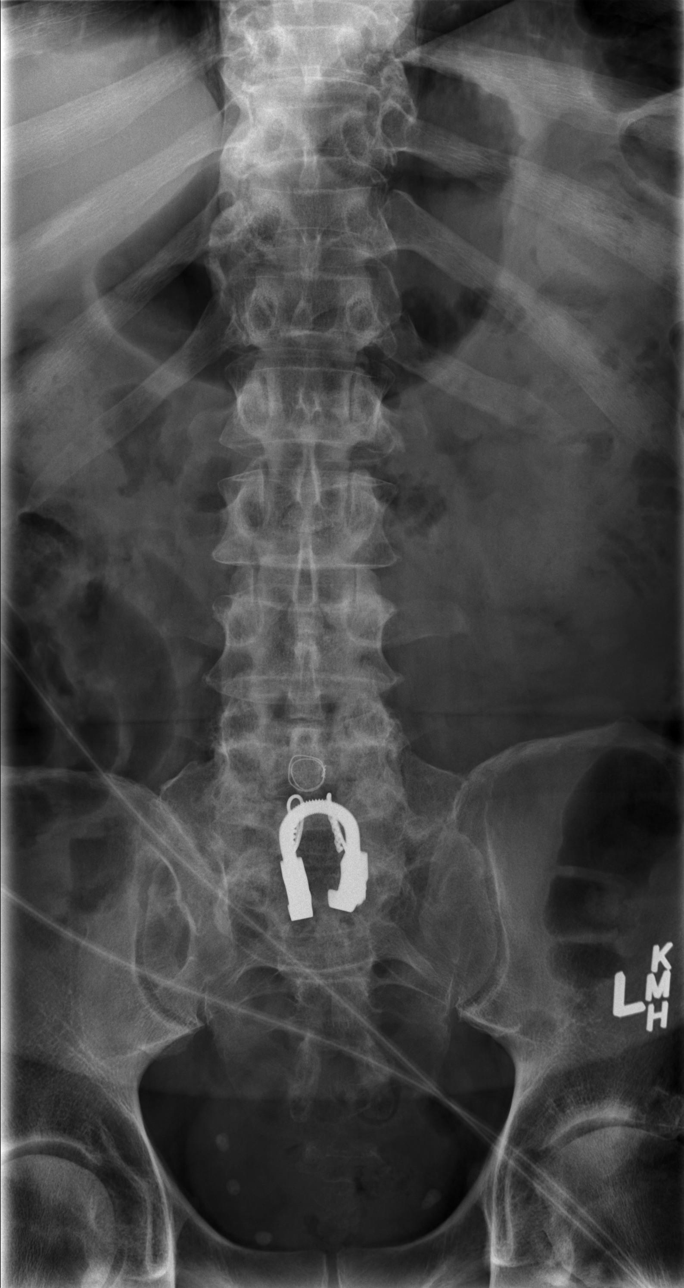

[t lumbar spine obl (1 of 2)]
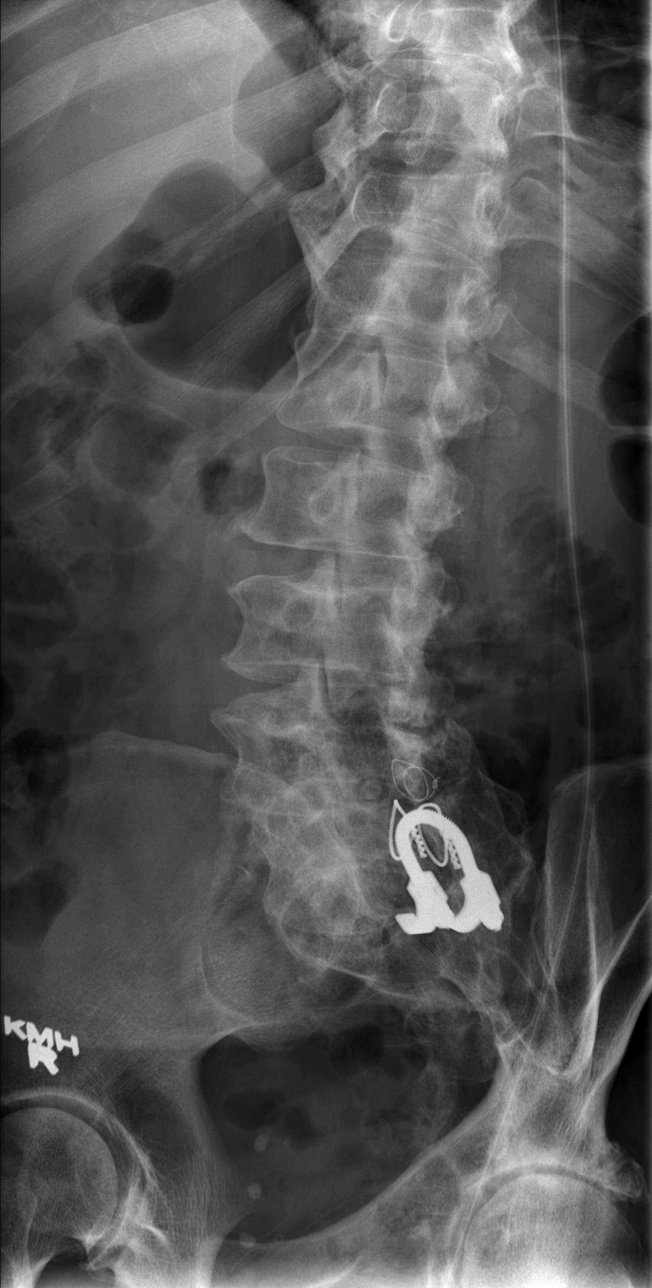

[t lumbar spine obl (2 of 2)]
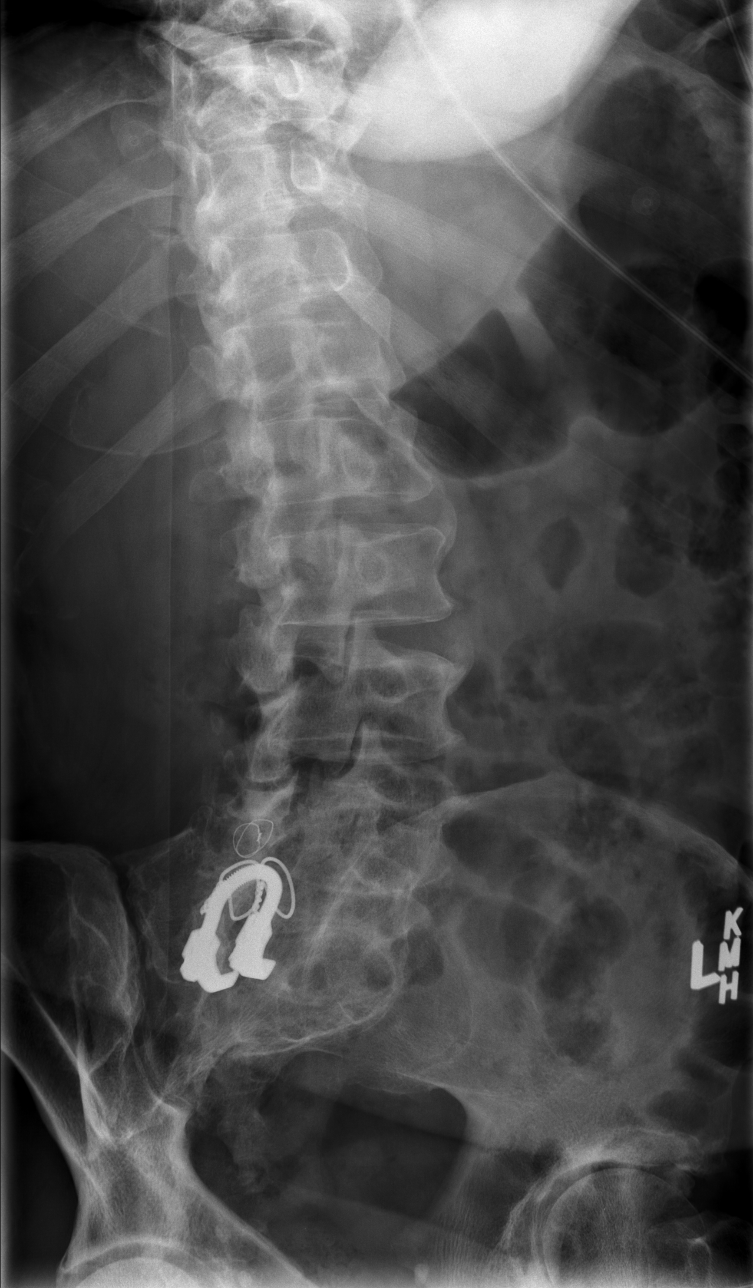

[t lumbar spine lat]
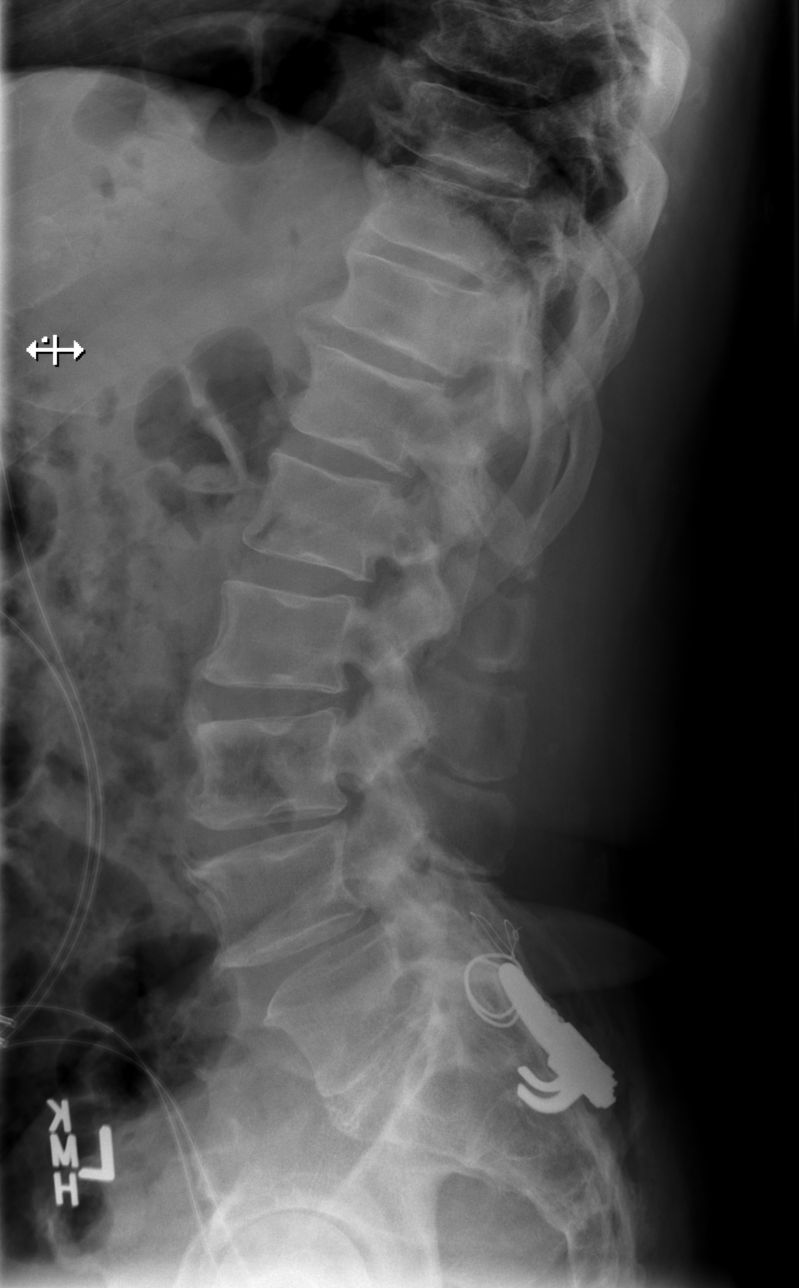

[t lumbar l-5 s-1 spot]
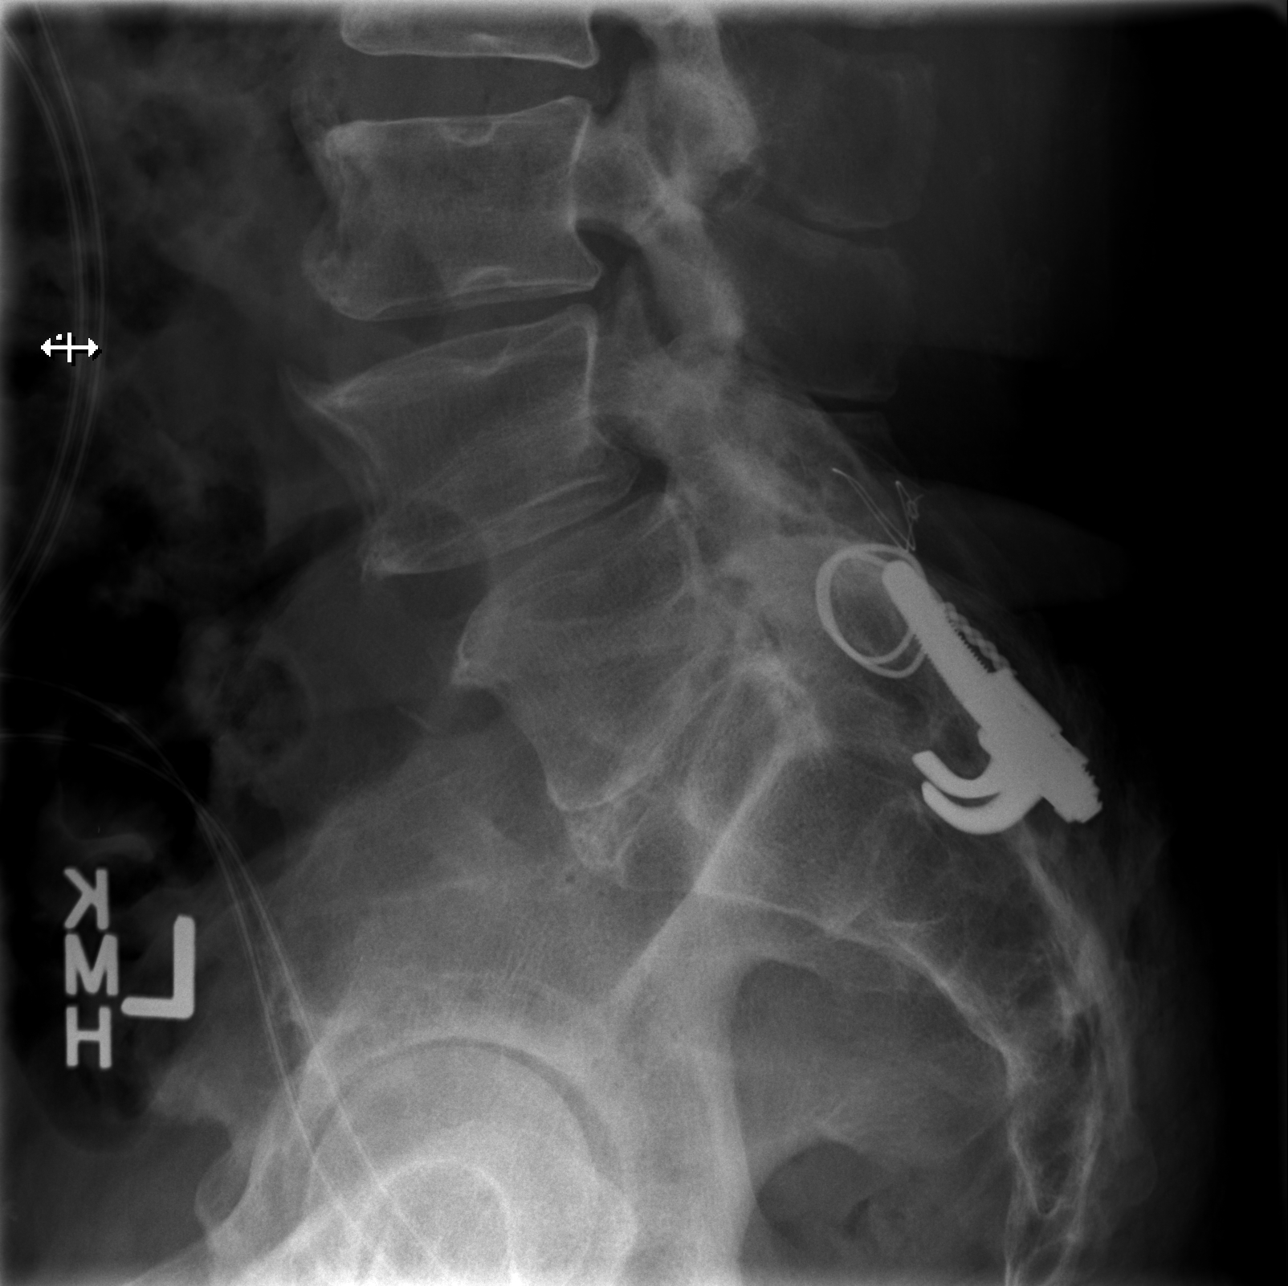

[5 of 5 positions shown; findings below may reference images not displayed]

FINDINGS: Postoperative changes to the L5-S1 level with posterior
spinal hardware and cerclage wires.  Probable solid fusion at that
level.  Trace anterolisthesis of L4 on L5.  Vertebral height
alignment within normal limits elsewhere.  Some H-shaped endplate
deformities in the other lumbar levels are noted, but no similar
thoracic findings are present.  L3-L4 and L4-L5 disc space
narrowing.  No pars fracture.  Negative aside joint.
IMPRESSION: No acute osseous abnormality in the lumbar spine.  Trace
anterolisthesis of L4 and L5 probably representing postoperative
adjacent segment disease given evidence of fusion at L5-S1.  L3-L4
and L4-L5 disc degeneration.

## 2013-04-12 IMAGING — CR DG CERVICAL SPINE COMPLETE 4+V
7 series · 7 of 7 positions shown · non-contrast
Comparison: Head CT 08/26/2011.

CLINICAL DATA: 60-year-old male status post fall with pain.

CERVICAL SPINE - COMPLETE 4+ VIEW

[w cervical spine lat]
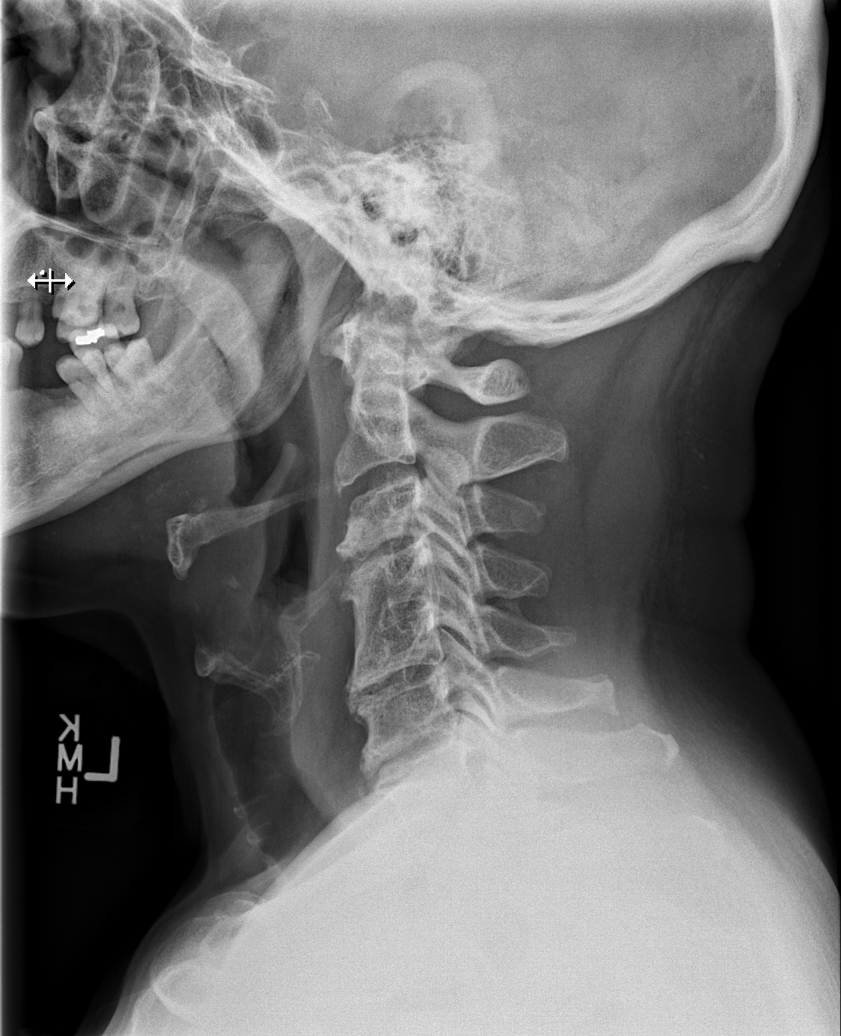

[w cervical spine ap_obl (1 of 2)]
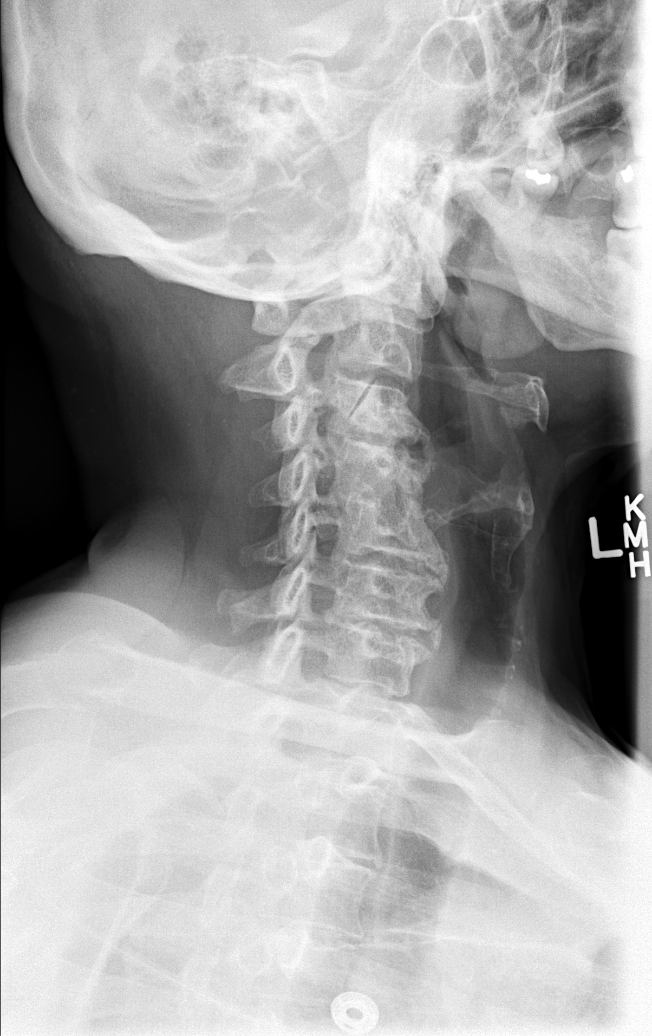

[w cervical spine ap_obl (2 of 2)]
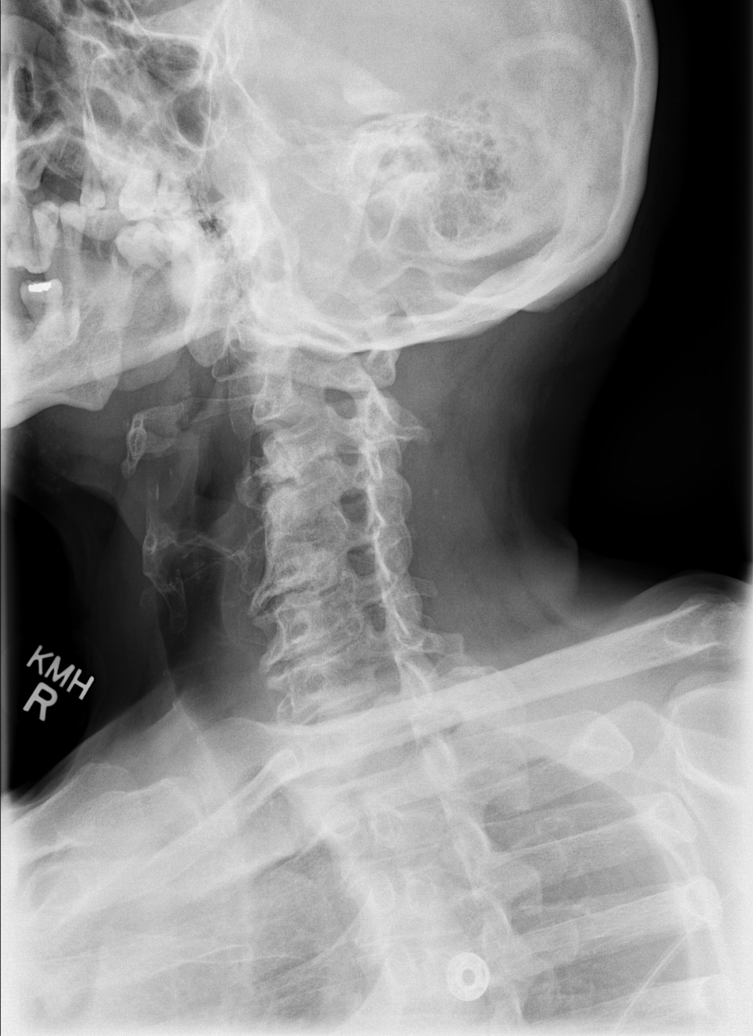

[w cervical spine ap]
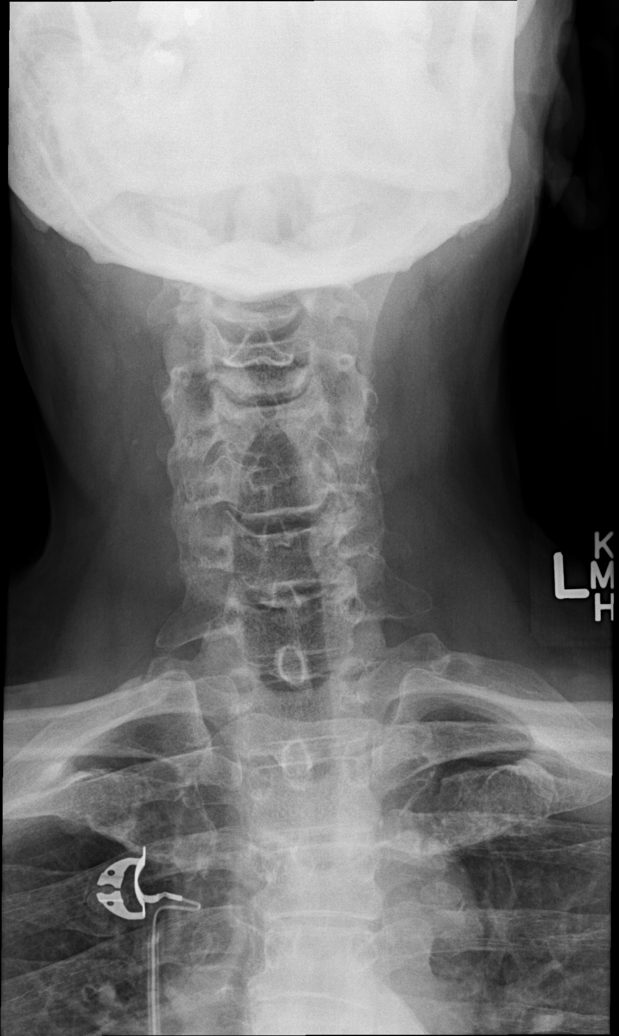

[w cervical spine odontoid]
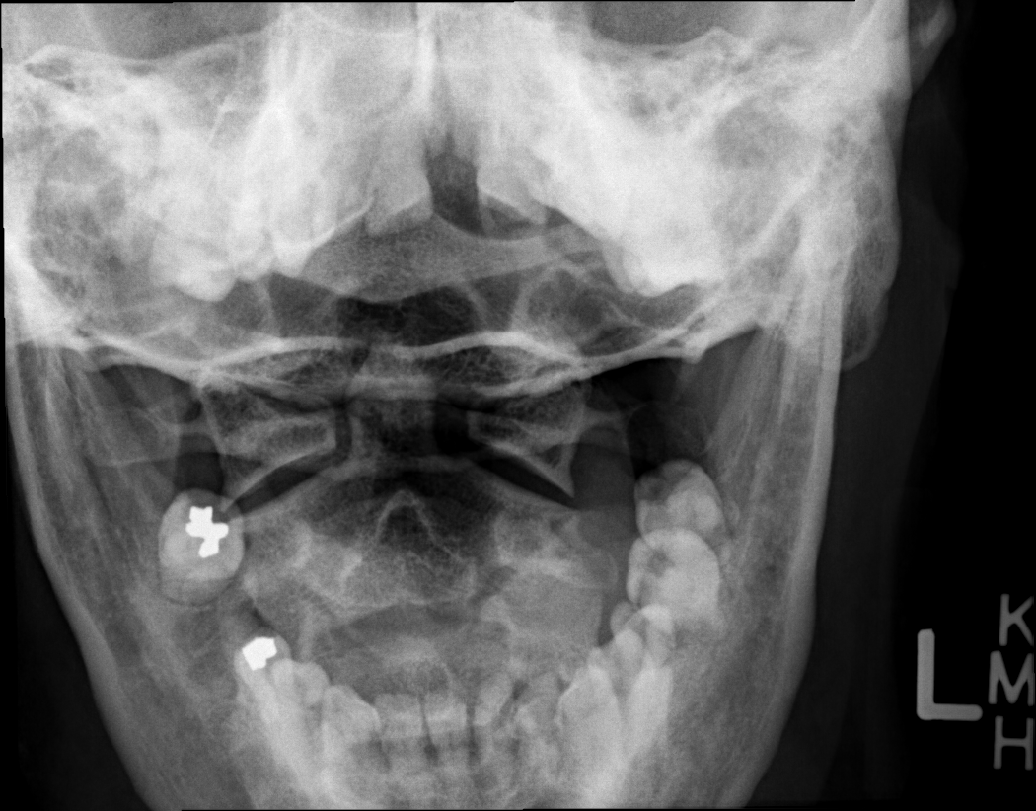

[w cervical swimmers (1 of 2)]
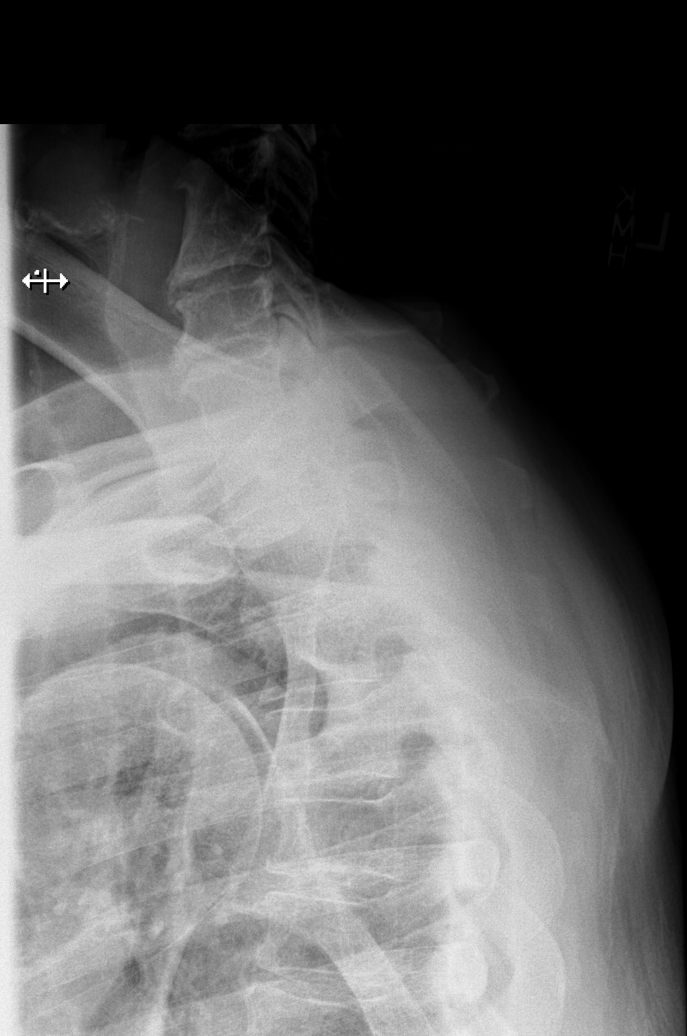

[w cervical swimmers (2 of 2)]
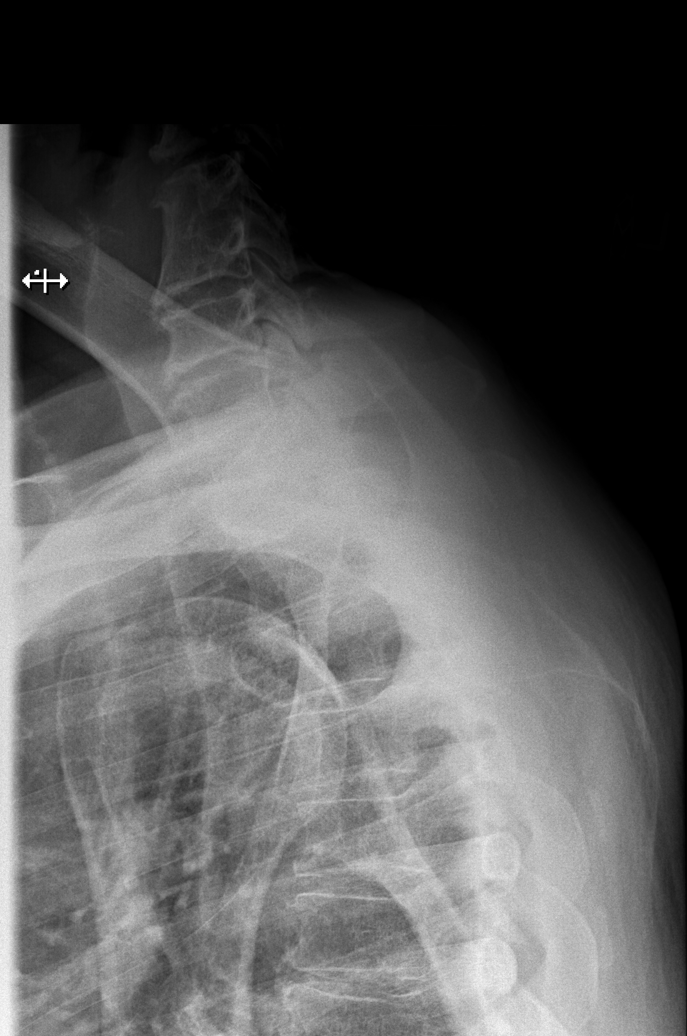

[7 of 7 positions shown; findings below may reference images not displayed]

FINDINGS: Previous C4-C5 fusion.  Disc space narrowing elsewhere in
the cervical spine sparing the C2-C3 level.  Prevertebral soft
tissue contours are within normal limits.  Degenerative endplate
spurring. Bilateral posterior element alignment is within normal
limits.  Osseous foraminal stenosis on the right at the C4 level is
moderate. Cervicothoracic junction alignment is within normal
limits.  AP alignment and lung apices within normal limits.  C1-C2
alignment and odontoid within normal limits.
IMPRESSION: Previous C4-C5 cervical fusion with degenerative changes elsewhere.
No acute fracture or listhesis identified in the cervical spine.
Ligamentous injury is not excluded.

## 2013-04-14 ENCOUNTER — Ambulatory Visit (INDEPENDENT_AMBULATORY_CARE_PROVIDER_SITE_OTHER): Payer: Medicare Other | Admitting: Infectious Diseases

## 2013-04-14 ENCOUNTER — Encounter: Payer: Self-pay | Admitting: Infectious Diseases

## 2013-04-14 VITALS — BP 132/82 | HR 70 | Temp 97.8°F | Ht 67.0 in | Wt 177.0 lb

## 2013-04-14 DIAGNOSIS — E1140 Type 2 diabetes mellitus with diabetic neuropathy, unspecified: Secondary | ICD-10-CM

## 2013-04-14 DIAGNOSIS — Z23 Encounter for immunization: Secondary | ICD-10-CM

## 2013-04-14 DIAGNOSIS — E1149 Type 2 diabetes mellitus with other diabetic neurological complication: Secondary | ICD-10-CM

## 2013-04-14 DIAGNOSIS — N529 Male erectile dysfunction, unspecified: Secondary | ICD-10-CM

## 2013-04-14 DIAGNOSIS — Z79899 Other long term (current) drug therapy: Secondary | ICD-10-CM

## 2013-04-14 DIAGNOSIS — Z113 Encounter for screening for infections with a predominantly sexual mode of transmission: Secondary | ICD-10-CM

## 2013-04-14 DIAGNOSIS — IMO0002 Reserved for concepts with insufficient information to code with codable children: Secondary | ICD-10-CM

## 2013-04-14 DIAGNOSIS — Z21 Asymptomatic human immunodeficiency virus [HIV] infection status: Secondary | ICD-10-CM

## 2013-04-14 DIAGNOSIS — B20 Human immunodeficiency virus [HIV] disease: Secondary | ICD-10-CM

## 2013-04-14 DIAGNOSIS — E1142 Type 2 diabetes mellitus with diabetic polyneuropathy: Secondary | ICD-10-CM

## 2013-04-14 NOTE — Assessment & Plan Note (Signed)
My great appreciation to Dr Burnard Bunting and her excellent care. Will have him set up for ophtho.

## 2013-04-14 NOTE — Assessment & Plan Note (Signed)
He is doing well. Will get him set up for colonoscopy. He is offered, refused condoms. Will see him back in 6 months.

## 2013-04-14 NOTE — Progress Notes (Signed)
  Subjective:    Patient ID: Michael [name], male    DOB: [birth year], 62 y.o.   MRN: [id number]  HPI 62 yo M with HIV+ (CD4 350 VL 44,500 November 2012). Started on complera. Has DM2 as well. Has been followed at IM clinic.  Been feeling well. Has been coughing for last 4 months. Can't remember how long he has been on ACE-I.  Cough is non-prod, no change in wt. Has some tightness in his breathing when he walks. Not improved with using inhaler. Never smoked.  Hasn't had ophtho this year. Is to be scheduled for colonoscopy as well.   HIV 1 RNA Quant (copies/mL)  Date Value  04/07/2013 <20   07/27/2012 <20   03/04/2012 47*     CD4 T Cell Abs (cmm)  Date Value  04/07/2013 450   07/27/2012 360*  03/04/2012 350*     Review of Systems  Constitutional: Negative for fever, chills and unexpected weight change.  Respiratory: Positive for cough and shortness of breath.   Cardiovascular: Negative for chest pain and leg swelling.  Gastrointestinal: Negative for diarrhea and constipation.  Genitourinary: Negative for difficulty urinating.       Objective:   Physical Exam  Constitutional: He appears well-developed and well-nourished.  HENT:  Mouth/Throat: No oropharyngeal exudate.  Eyes: EOM are normal. Pupils are equal, round, and reactive to light.  Neck: Neck supple.  Cardiovascular: Normal rate.  An irregularly irregular rhythm present.  Pulmonary/Chest: Effort normal.  Abdominal: Soft. Bowel sounds are normal. There is no tenderness.  Musculoskeletal: He exhibits no edema.  Lymphadenopathy:    He has no cervical adenopathy.          Assessment & Plan:

## 2013-04-14 NOTE — Addendum Note (Signed)
Addended by: Myrtis Hopping A on: 04/14/2013 10:24 AM   Modules accepted: Orders

## 2013-04-14 NOTE — Assessment & Plan Note (Signed)
Can't afford viagra.

## 2013-04-15 ENCOUNTER — Other Ambulatory Visit: Payer: Self-pay

## 2013-04-16 ENCOUNTER — Telehealth: Payer: Self-pay | Admitting: *Deleted

## 2013-04-16 ENCOUNTER — Encounter: Payer: Self-pay | Admitting: Gastroenterology

## 2013-04-16 NOTE — Telephone Encounter (Signed)
Called patient and left a voice mail to return my call regarding his referrals. Opthalmology with Dr. Sabra Heck for 05/20/13 at 1:15 pm and [facility] on 05/06/13, 2:30 pm for consultation and 05/20/13 at 2:00 pm for colonoscopy. [facility] will be sending patient appt information. Myrtis Hopping

## 2013-04-20 NOTE — Addendum Note (Signed)
Addended by: Marcelino Duster on: 04/20/2013 03:03 PM   Modules accepted: Orders

## 2013-04-26 IMAGING — CR DG ANKLE COMPLETE 3+V*L*
3 series · 3 of 3 positions shown · non-contrast
Comparison: None.

CLINICAL DATA: Injury.  Evaluate for pain.  Fell 2 weeks ago.

LEFT ANKLE COMPLETE - 3+ VIEW

[t ankle joint ap left]
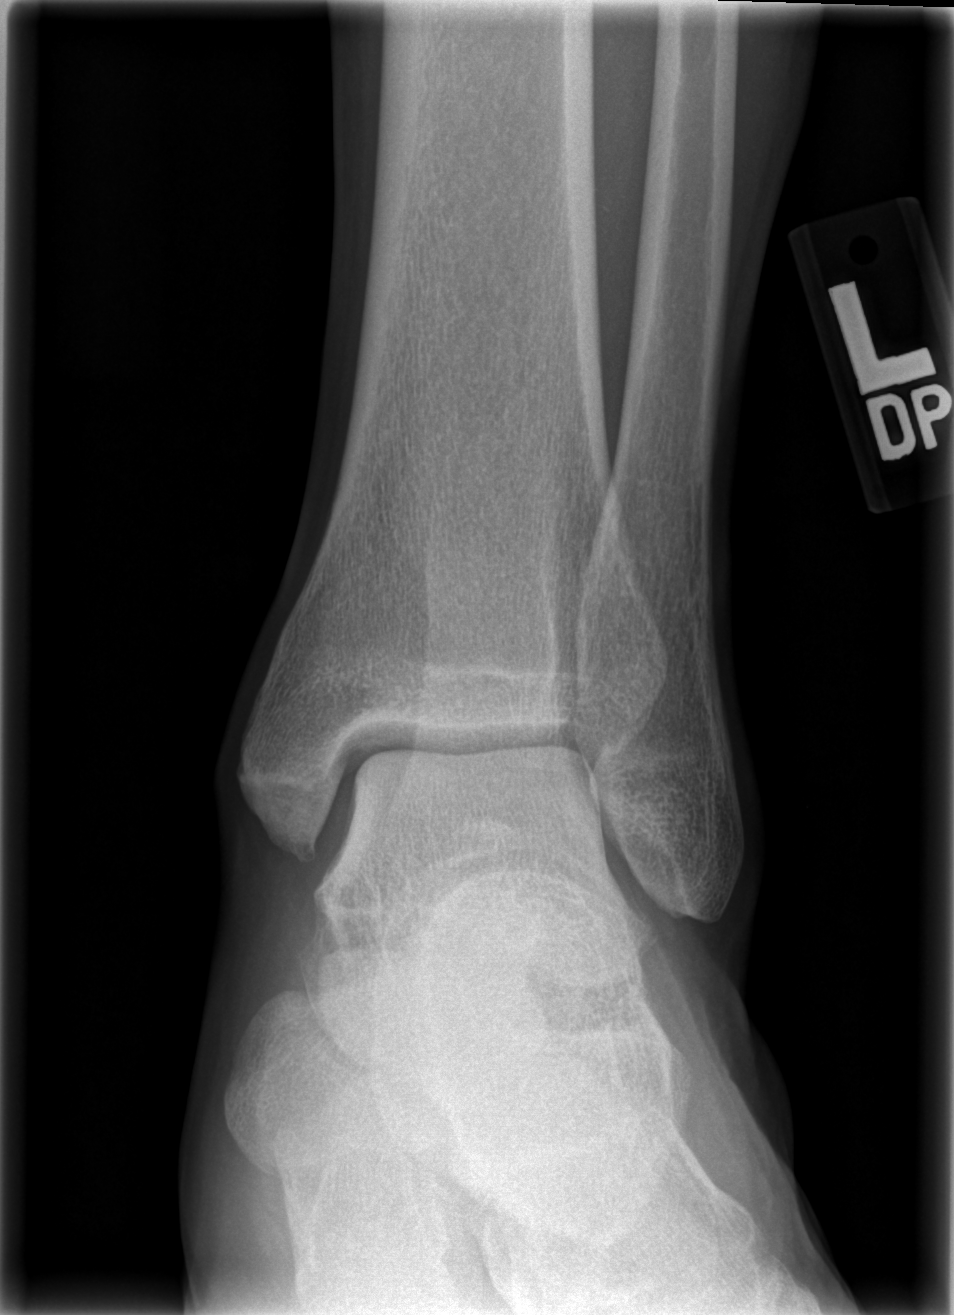

[t ankle joint oblique left]
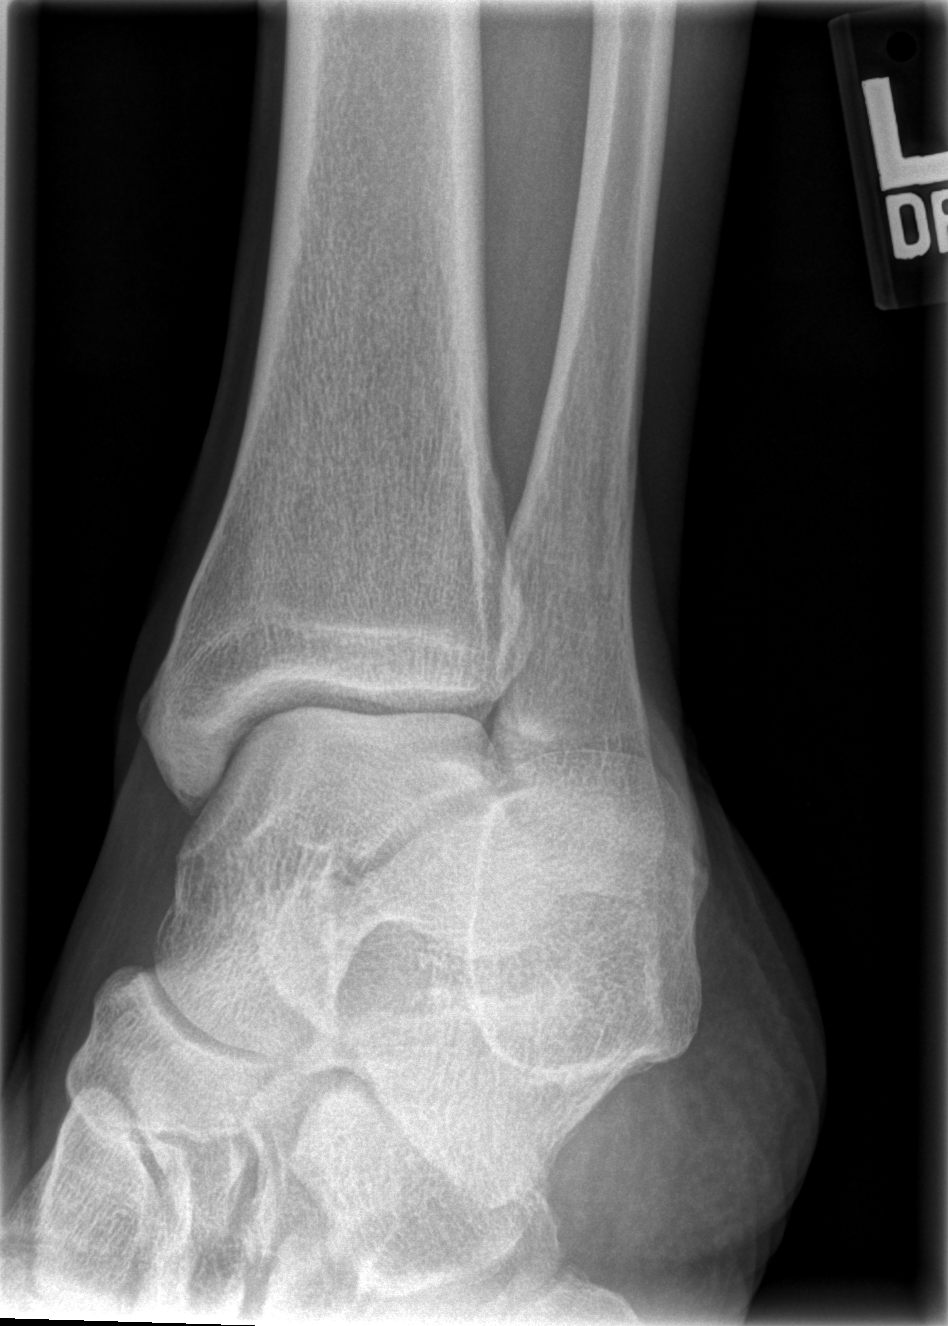

[t ankle joint lat left]
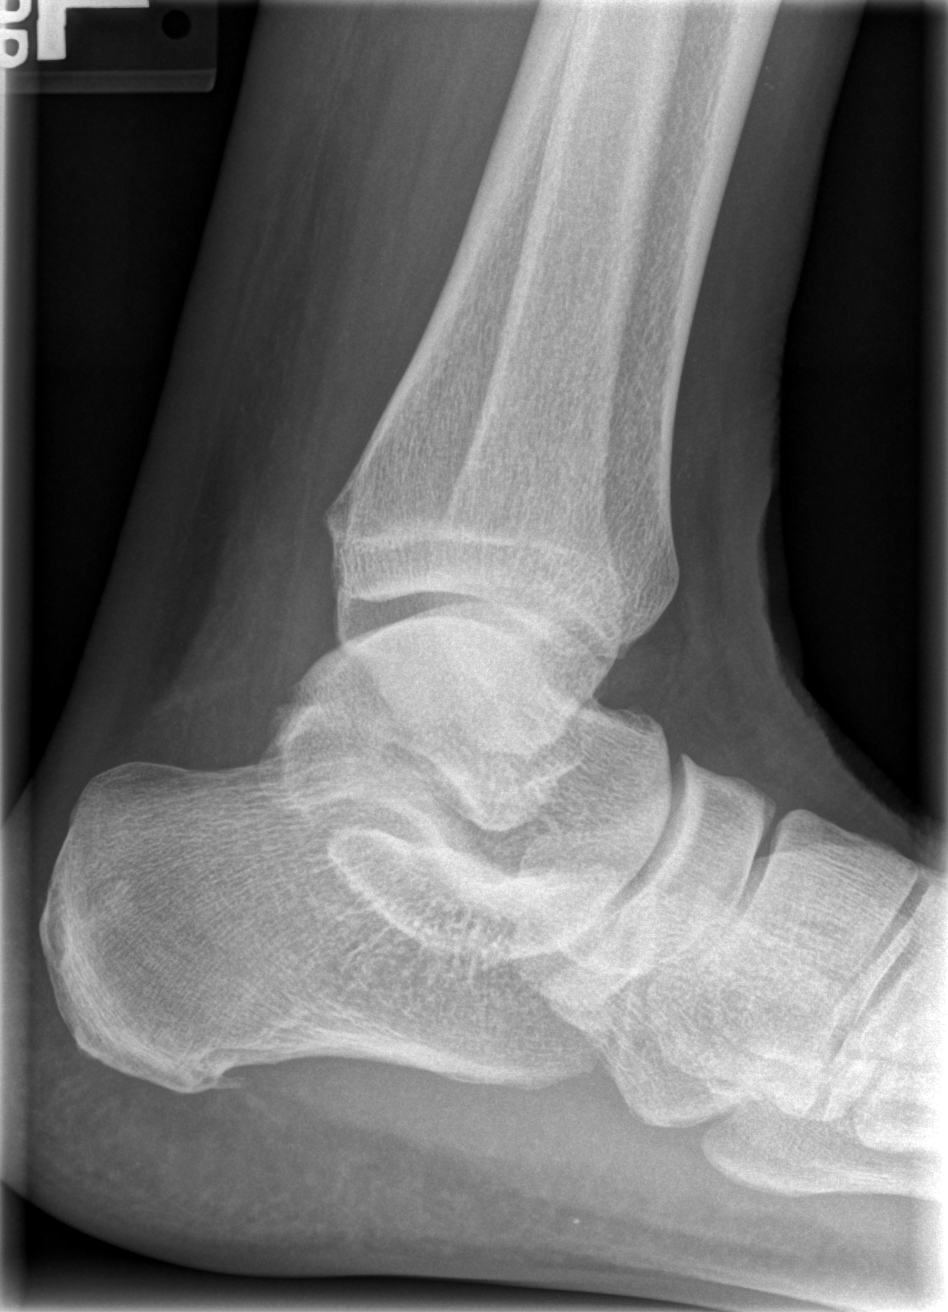

[3 of 3 positions shown; findings below may reference images not displayed]

FINDINGS: There is no evidence for acute fracture or subluxation.
No evidence for radiopaque foreign body or soft tissue gas.  Small
plantar calcaneal spur is identified.
IMPRESSION: No evidence for acute  abnormality.

## 2013-05-06 ENCOUNTER — Ambulatory Visit (AMBULATORY_SURGERY_CENTER): Payer: Medicare Other

## 2013-05-06 ENCOUNTER — Encounter: Payer: Self-pay | Admitting: Gastroenterology

## 2013-05-06 VITALS — Ht 67.5 in | Wt 186.0 lb

## 2013-05-06 DIAGNOSIS — Z1211 Encounter for screening for malignant neoplasm of colon: Secondary | ICD-10-CM

## 2013-05-06 MED ORDER — NA SULFATE-K SULFATE-MG SULF 17.5-3.13-1.6 GM/177ML PO SOLN: 1.0000 | Freq: Once | ORAL | Status: DC

## 2013-05-12 ENCOUNTER — Other Ambulatory Visit: Payer: Self-pay | Admitting: *Deleted

## 2013-05-12 MED ORDER — METFORMIN HCL 1000 MG PO TABS: 1000.0000 mg | ORAL_TABLET | Freq: Two times a day (BID) | ORAL | Status: DC

## 2013-05-20 ENCOUNTER — Encounter: Payer: Self-pay | Admitting: Gastroenterology

## 2013-05-20 ENCOUNTER — Ambulatory Visit (AMBULATORY_SURGERY_CENTER): Payer: Medicare Other | Admitting: Gastroenterology

## 2013-05-20 VITALS — BP 118/85 | HR 74 | Temp 97.8°F | Resp 36 | Ht 67.0 in | Wt 186.0 lb

## 2013-05-20 DIAGNOSIS — Z1211 Encounter for screening for malignant neoplasm of colon: Secondary | ICD-10-CM

## 2013-05-20 LAB — GLUCOSE, CAPILLARY
Glucose-Capillary: 135 mg/dL — ABNORMAL HIGH (ref 70–99)
Glucose-Capillary: 119 mg/dL — ABNORMAL HIGH (ref 70–99)

## 2013-05-20 MED ORDER — SODIUM CHLORIDE 0.9 % IV SOLN: 500.0000 mL | INTRAVENOUS | Status: DC

## 2013-05-20 NOTE — Op Note (Signed)
[facility]  [address]. [place], [address]   COLONOSCOPY PROCEDURE REPORT  PATIENT: Eton, Sentz  MR#: [id number] BIRTHDATE: 12-Jul-1951 , 62  yrs. old GENDER: Male ENDOSCOPIST: Inda Castle, MD REFERRED BY: PROCEDURE DATE:  05/20/2013 PROCEDURE: First Screening Colonoscopy - Avg.  risk and is 50 yrs.  old or older Yes.  Prior Negative Screening - Now for repeat screening. N/A  History of Adenoma - Now for follow-up colonoscopy & has been > or = to 3 yrs.  N/A ASA CLASS:   Class II INDICATIONS:average risk screening. MEDICATIONS: MAC sedation, administered by CRNA and propofol (Diprivan) 100mg  IV  DESCRIPTION OF PROCEDURE:   After the risks benefits and alternatives of the procedure were thoroughly explained, informed consent was obtained.  A digital rectal exam revealed no abnormalities of the rectum.   The [id number]  endoscope was introduced through the anus and advanced to the cecum, which was identified by both the appendix and ileocecal valve. No adverse events experienced.   The quality of the prep was Suprep fair  The instrument was then slowly withdrawn as the colon was fully examined.      COLON FINDINGS: A normal appearing cecum, ileocecal valve, and appendiceal orifice were identified.  The ascending, hepatic flexure, transverse, splenic flexure, descending, sigmoid colon and rectum appeared unremarkable.  No polyps or cancers were seen. Retroflexed views revealed no abnormalities. The time to cecum=3 minutes 05 seconds.  Withdrawal time=7 minutes 29 seconds.  The scope was withdrawn and the procedure completed. COMPLICATIONS: There were no complications.  ENDOSCOPIC IMPRESSION: Normal colon  RECOMMENDATIONS: Continue current colorectal screening recommendations for "routine risk" patients with a repeat colonoscopy in 10 years.   eSigned:  Inda Castle, MD 05/20/2013 3:07 PM   cc: Fransisca Kaufmann, MD

## 2013-05-20 NOTE — Progress Notes (Signed)
  [facility] Post-op Note  Patient: Michael [name]  Procedure(s) Performed: colonoscopy  Patient Location: [facility] - Recovery Area  Anesthesia Type: Deep Sedation/Propofol  Level of Consciousness: awake, oriented and patient cooperative  Airway and Oxygen Therapy: Patient Spontanous Breathing  Post-op Pain: none  Post-op Assessment:  Post-op Vital signs reviewed, Patient's Cardiovascular Status Stable, Respiratory Function Stable, Patent Airway, No signs of Nausea or vomiting and Pain level controlled  Post-op Vital Signs: Reviewed and stable  Complications: No apparent anesthesia complications  Kejuan Bekker E 3:10 PM

## 2013-05-20 NOTE — Progress Notes (Signed)
Patient did not experience any of the following events: a burn prior to discharge; a fall within the facility; wrong site/side/patient/procedure/implant event; or a hospital transfer or hospital admission upon discharge from the facility. (G8907) Patient did not have preoperative order for IV antibiotic SSI prophylaxis. (G8918)  

## 2013-05-20 NOTE — Patient Instructions (Addendum)
YOU HAD AN ENDOSCOPIC PROCEDURE TODAY AT [facility]: Refer to the procedure report that was given to you for any specific questions about what was found during the examination.  If the procedure report does not answer your questions, please call your gastroenterologist to clarify.  If you requested that your care partner not be given the details of your procedure findings, then the procedure report has been included in a sealed envelope for you to review at your convenience later.  YOU SHOULD EXPECT: Some feelings of bloating in the abdomen. Passage of more gas than usual.  Walking can help get rid of the air that was put into your GI tract during the procedure and reduce the bloating. If you had a lower endoscopy (such as a colonoscopy or flexible sigmoidoscopy) you may notice spotting of blood in your stool or on the toilet paper. If you underwent a bowel prep for your procedure, then you may not have a normal bowel movement for a few days.  DIET: Your first meal following the procedure should be a light meal and then it is ok to progress to your normal diet.  A half-sandwich or bowl of soup is an example of a good first meal.  Heavy or fried foods are harder to digest and may make you feel nauseous or bloated.  Likewise meals heavy in dairy and vegetables can cause extra gas to form and this can also increase the bloating.  Drink plenty of fluids but you should avoid alcoholic beverages for 24 hours.  ACTIVITY: Your care partner should take you home directly after the procedure.  You should plan to take it easy, moving slowly for the rest of the day.  You can resume normal activity the day after the procedure however you should NOT DRIVE or use heavy machinery for 24 hours (because of the sedation medicines used during the test).    SYMPTOMS TO REPORT IMMEDIATELY: A gastroenterologist can be reached at any hour.  During normal business hours, 8:30 AM to 5:00 PM Monday through Friday,  call [phone].  After hours and on weekends, please call the GI answering service at [phone] who will take a message and have the physician on call contact you.   Following lower endoscopy (colonoscopy or flexible sigmoidoscopy):  Excessive amounts of blood in the stool  Significant tenderness or worsening of abdominal pains  Swelling of the abdomen that is new, acute  Fever of 100F or higher   FOLLOW UP: Our staff will call the home number listed on your records the next business day following your procedure to check on you and address any questions or concerns that you may have at that time regarding the information given to you following your procedure. This is a courtesy call and so if there is no answer at the home number and we have not heard from you through the emergency physician on call, we will assume that you have returned to your regular daily activities without incident.  SIGNATURES/CONFIDENTIALITY: You and/or your care partner have signed paperwork which will be entered into your electronic medical record.  These signatures attest to the fact that that the information above on your After Visit Summary has been reviewed and is understood.  Full responsibility of the confidentiality of this discharge information lies with you and/or your care-partner.  Continue your normal medications  Follow up colonoscopy in 10 years

## 2013-05-21 ENCOUNTER — Telehealth: Payer: Self-pay

## 2013-05-21 NOTE — Telephone Encounter (Signed)
  Follow up Call-  Call back number 05/20/2013  Post procedure Call Back phone  # [phone]  Permission to leave phone message Yes     Patient questions:  Do you have a fever, pain , or abdominal swelling? no Pain Score  0 *  Have you tolerated food without any problems? yes  Have you been able to return to your normal activities? yes  Do you have any questions about your discharge instructions: Diet   no Medications  no Follow up visit  no  Do you have questions or concerns about your Care? no  Actions: * If pain score is 4 or above: No action needed, pain <4.  No problems per the pt. maw

## 2013-06-02 ENCOUNTER — Other Ambulatory Visit: Payer: Self-pay | Admitting: Internal Medicine

## 2013-06-02 ENCOUNTER — Other Ambulatory Visit: Payer: Self-pay | Admitting: Infectious Diseases

## 2013-06-03 NOTE — Telephone Encounter (Signed)
refill 

## 2013-06-07 IMAGING — MR MR KNEE*L* W/O CM
6 series · 40 of 40 positions shown · non-contrast
Comparison: Radiographs dated 03/03/2012

CLINICAL DATA: Medial and anterior knee pain since a fall 2 months
ago.

MRI OF THE LEFT KNEE WITHOUT CONTRAST
TECHNIQUE: Multiplanar, multisequence MR imaging of the left knee
was performed.  No intravenous contrast was administered.

[Series 3: PD fat-sat · axial · 4.0mm · 0.62mm/px · z∈[-78,+32]mm · 7 of 23 slices shown (1 of 3)]
[im 1/23]
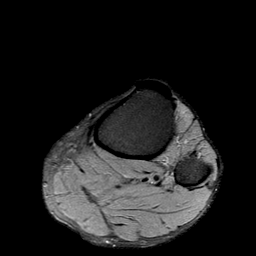
[im 4/23]
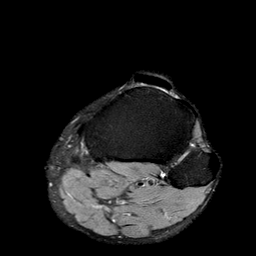
[im 8/23]
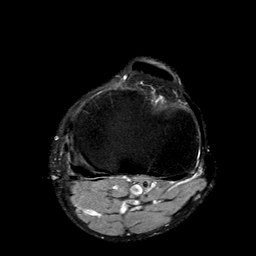
[im 12/23]
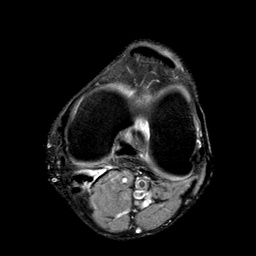
[im 15/23]
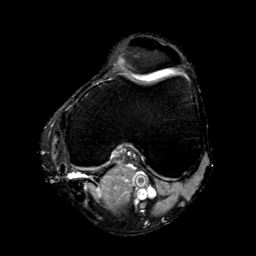
[im 19/23]
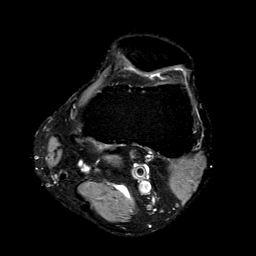
[im 23/23]
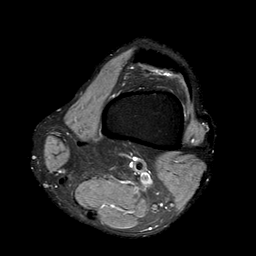

[Series 4: T1 · coronal · 4.0mm · 0.62mm/px · 7 of 21 slices shown]
[im 1/21]
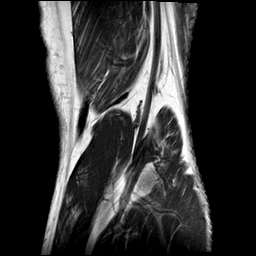
[im 4/21]
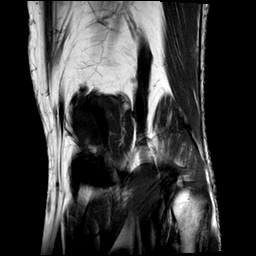
[im 7/21]
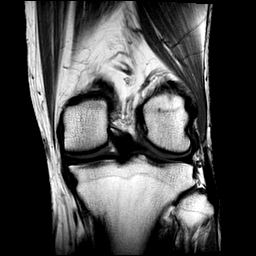
[im 11/21]
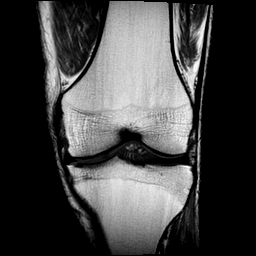
[im 14/21]
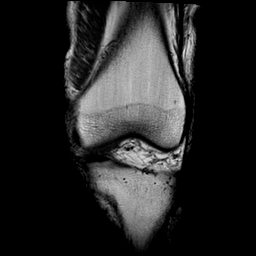
[im 17/21]
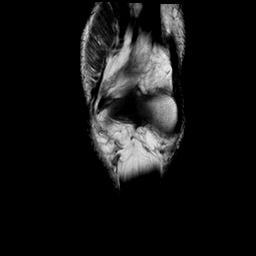
[im 21/21]
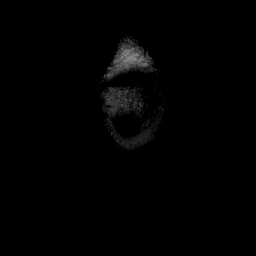

[Series 5: PD fat-sat · coronal · 4.0mm · 0.62mm/px · 7 of 21 slices shown (2 of 3)]
[im 1/21]
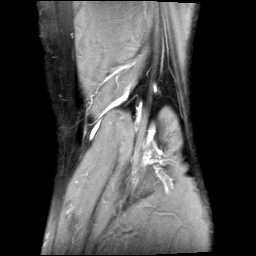
[im 4/21]
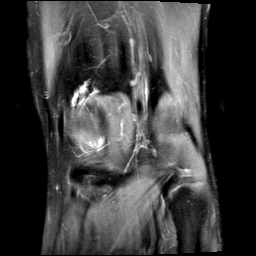
[im 7/21]
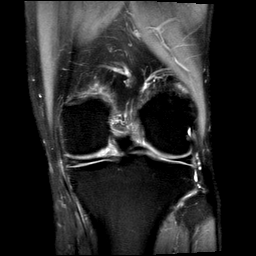
[im 11/21]
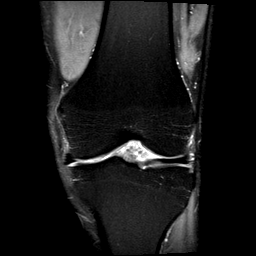
[im 14/21]
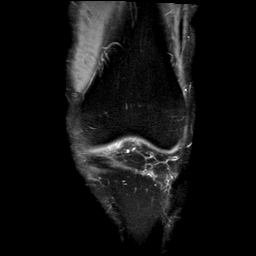
[im 17/21]
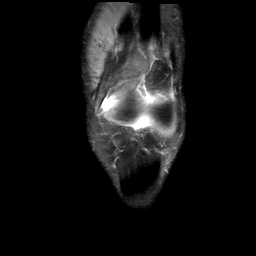
[im 21/21]
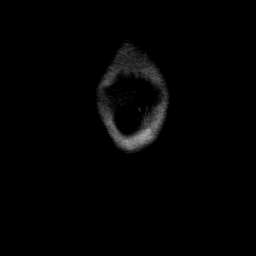

[Series 6: T2 fat-sat · coronal · 4.0mm · 0.62mm/px · 7 of 21 slices shown]
[im 1/21]
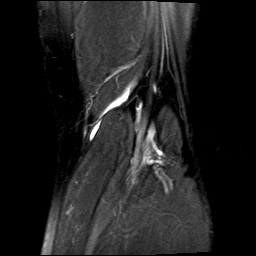
[im 4/21]
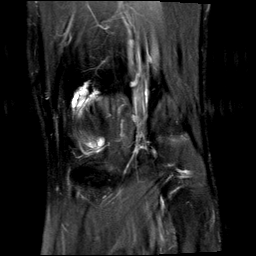
[im 7/21]
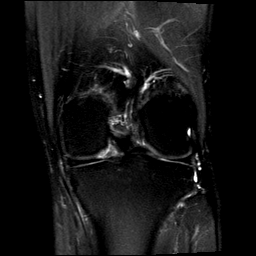
[im 11/21]
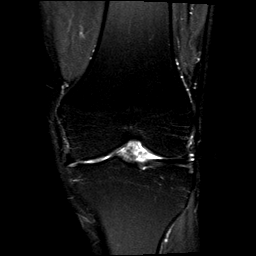
[im 14/21]
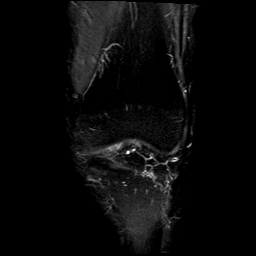
[im 17/21]
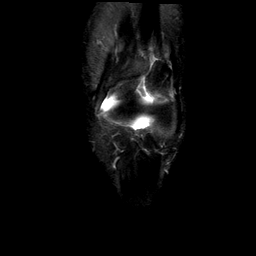
[im 21/21]
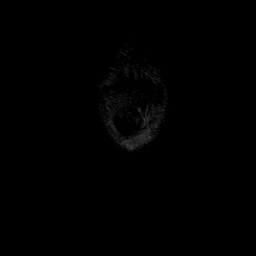

[Series 7: PD fat-sat · sagittal · 4.0mm · 0.62mm/px · 7 of 21 slices shown (3 of 3)]
[im 1/21]
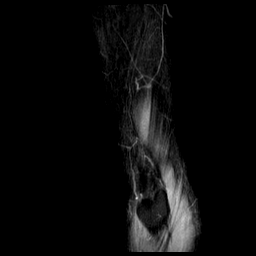
[im 4/21]
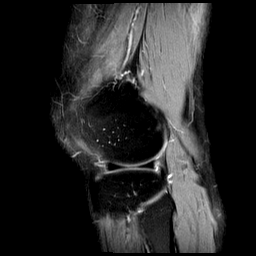
[im 7/21]
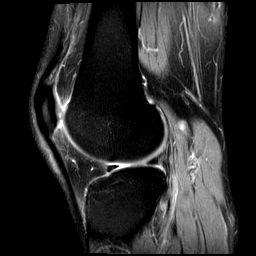
[im 11/21]
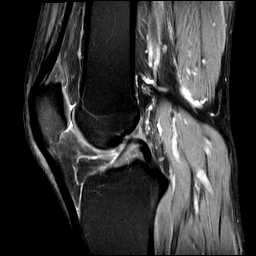
[im 14/21]
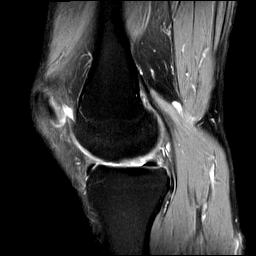
[im 17/21]
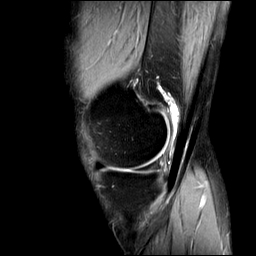
[im 21/21]
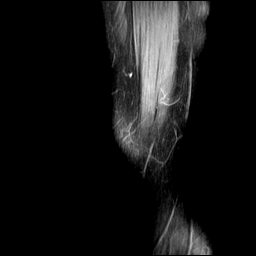

[Series 8: PD · coronal · 2.0mm · 0.62mm/px · 5 of 15 slices shown]
[im 1/15]
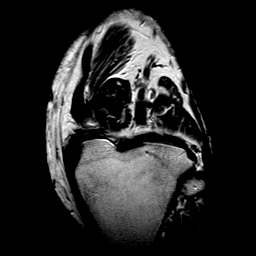
[im 4/15]
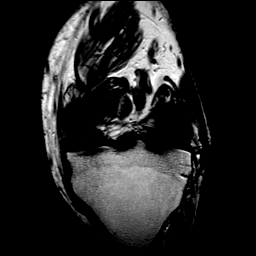
[im 8/15]
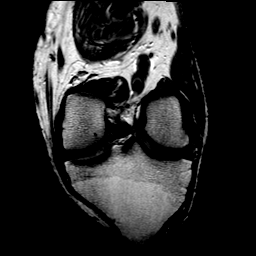
[im 11/15]
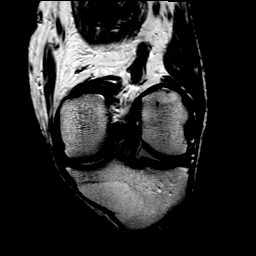
[im 15/15]
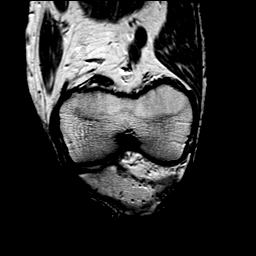

[40 of 40 positions shown; findings below may reference images not displayed]

FINDINGS: MENISCI
Medial:  There is peripheral degeneration of the posterior horn of
the medial meniscus at the meniscocapsular junction without a
definable meniscal tear.  Medial meniscus is otherwise normal.
Lateral:  Normal.

LIGAMENTS
Cruciates:  Normal.
Collaterals:  Normal.

CARTILAGE
Patellofemoral:  Normal.
Medial:  Focal fissures and irregularity of the cartilage of the
mid to posterior aspect of the medial femoral condyle.
Lateral:  Normal.

Joint:  No joint effusion.
Popliteal Fossa:  Small Baker's cyst.
Extensor Mechanism:  Normal.
Bones: Minimal marginal spurring of the medial and lateral tibial
plateaus.
IMPRESSION: Chondromalacia of the medial femoral condyle with slight peripheral
degeneration of the meniscocapsular junction of the medial
meniscus.

Small Baker's cyst.

## 2013-06-10 DIAGNOSIS — I219 Acute myocardial infarction, unspecified: Secondary | ICD-10-CM | POA: Insufficient documentation

## 2013-06-24 ENCOUNTER — Other Ambulatory Visit: Payer: Self-pay | Admitting: Internal Medicine

## 2013-06-24 ENCOUNTER — Encounter: Payer: Self-pay | Admitting: Internal Medicine

## 2013-06-24 ENCOUNTER — Ambulatory Visit (INDEPENDENT_AMBULATORY_CARE_PROVIDER_SITE_OTHER): Payer: Medicare Other | Admitting: Internal Medicine

## 2013-06-24 VITALS — BP 118/77 | HR 84 | Temp 97.9°F | Wt 180.2 lb

## 2013-06-24 DIAGNOSIS — M25562 Pain in left knee: Secondary | ICD-10-CM

## 2013-06-24 DIAGNOSIS — Z23 Encounter for immunization: Secondary | ICD-10-CM

## 2013-06-24 DIAGNOSIS — M25569 Pain in unspecified knee: Secondary | ICD-10-CM

## 2013-06-24 DIAGNOSIS — E1140 Type 2 diabetes mellitus with diabetic neuropathy, unspecified: Secondary | ICD-10-CM

## 2013-06-24 DIAGNOSIS — I2510 Atherosclerotic heart disease of native coronary artery without angina pectoris: Secondary | ICD-10-CM

## 2013-06-24 DIAGNOSIS — E1149 Type 2 diabetes mellitus with other diabetic neurological complication: Secondary | ICD-10-CM

## 2013-06-24 DIAGNOSIS — IMO0002 Reserved for concepts with insufficient information to code with codable children: Secondary | ICD-10-CM

## 2013-06-24 DIAGNOSIS — E1142 Type 2 diabetes mellitus with diabetic polyneuropathy: Secondary | ICD-10-CM

## 2013-06-24 DIAGNOSIS — N529 Male erectile dysfunction, unspecified: Secondary | ICD-10-CM

## 2013-06-24 LAB — POCT GLYCOSYLATED HEMOGLOBIN (HGB A1C): Hemoglobin A1C: 6.9

## 2013-06-24 LAB — GLUCOSE, CAPILLARY: Glucose-Capillary: 74 mg/dL (ref 70–99)

## 2013-06-24 MED ORDER — DICLOFENAC SODIUM 1 % TD GEL: TRANSDERMAL | Status: DC

## 2013-06-24 MED ORDER — INSULIN GLARGINE 100 UNIT/ML ~~LOC~~ SOLN: SUBCUTANEOUS | Status: DC

## 2013-06-24 MED ORDER — TRAMADOL HCL 50 MG PO TABS: 50.0000 mg | ORAL_TABLET | Freq: Four times a day (QID) | ORAL | Status: DC | PRN

## 2013-06-24 NOTE — Assessment & Plan Note (Addendum)
Please see overview for details of STEMI.  Cath showed LM nl; LAD prox 95% with thrombus, mid 75%, D2 75% LCX OM2 75%, RCA small, PDA 95%, PLV 95%  Patient seems to have also been treated for post MI pericarditis (of note, echo at [facility] did not show pericardial effusion); He has his discharge med list, but is not clear about exactly what medications he is taking. He also has no cardiology follow up.   We will refer him to cardiology at [facility] so he can establish care there since he has moved to [place].  He has also not been enrolled in cardiopulm rehab as far as he knows.  He should also bring his meds to his next visit so we can assess what he is actually taking (d/c summary from [facility] suggested no metoprolol, but med rec continued med, he doesn't know if he is or is not taking this medication) vs cardizem. He is on statin. Also supposedly taking ASA (unclear if he is adhering to taper) and plavix.

## 2013-06-24 NOTE — Assessment & Plan Note (Signed)
Given patient's new CAD and that he is on several BP meds and NTG prn, I have asked that he d/c viagra.

## 2013-06-24 NOTE — Patient Instructions (Signed)
General Instructions: -Continue taking your medications, but PLEASE bring all of your medications with you at the next visit -Decrease your lantus to 30units before bed, continue to eat a healthy diet -I have refilled tramadol and voltaren gel for your knee pain -Today we are going to give you your flu shot for the year  Should you have any new or worsening symptoms, please be sure to call the clinic at [phone].   Treatment Goals:  Goals (1 Years of Data) as of 06/24/13         As of Today 05/20/13 05/20/13 05/20/13 05/20/13     Blood Pressure    . Blood Pressure < 140/90  118/77 118/85 122/86 128/86 116/75     Result Component    . HEMOGLOBIN A1C < 7.0  6.9        . LDL CALC < 100            Progress Toward Treatment Goals:  Treatment Goal 06/24/2013  Hemoglobin A1C improved  Blood pressure at goal    Self Care Goals & Plans:  Self Care Goal 06/24/2013  Manage my medications take my medicines as prescribed; bring my medications to every visit; refill my medications on time  Monitor my health keep track of my blood glucose; bring my glucose meter and log to each visit  Eat healthy foods eat fruit for snacks and desserts; eat foods that are low in salt; eat baked foods instead of fried foods  Be physically active -  Meeting treatment goals maintain the current self-care plan    Home Blood Glucose Monitoring 06/24/2013  Check my blood sugar 3 times a day  When to check my blood sugar before breakfast; before lunch; at bedtime     Care Management & Community Referrals:  Referral 10/09/2012  Referrals made for care management support diabetes educator

## 2013-06-24 NOTE — Assessment & Plan Note (Signed)
Lab Results  Component Value Date   HGBA1C 6.9 06/24/2013   HGBA1C 7.2 04/07/2013   HGBA1C 7.2 10/09/2012     Assessment: Diabetes control: good control (HgbA1C at goal) Progress toward A1C goal:  improved  Plan: Medications:  given sympomatic hypoglycemia, will decrase lantus to 30u qHS Home glucose monitoring: Frequency: 3 times a day Timing: before breakfast;before lunch;at bedtime Instruction/counseling given: reminded to bring blood glucose meter & log to each visit and reminded to bring medications to each visit Educational resources provided: brochure Self management tools provided: copy of home glucose meter download;home glucose logbook

## 2013-06-24 NOTE — Assessment & Plan Note (Signed)
Refilled tramadol & voltaren gel.

## 2013-06-24 NOTE — Progress Notes (Signed)
Subjective:   Patient ID: Michael [name] male   DOB: 07-Feb-1951 62 y.o.   MRN: [id number]  HPI: [name] is a 62 y.o. man with h/o DM, HTN, HIV, depression and cocaine abuse who presents for HFU.  He was hospitalized from 06/10/13-06/12/13 after having an MI and now s/p BMS to proximal and distal LAD.  Has moved to [facility] about 2 months ago, rents a room in a friend's house (only the two of them live there).  But since hospital discharge staying with son in [place].   CBG 74, feels dizzy/lightheaded/blurry vision (morning CBGs usually in 80s); taking 32u lantus qHS with metformin; no meal time insulin. Eating fruits. 2 meals/day.  Highest CBG about 162.  Some exertional dyspnea. Unclear about palpitations;   Knee pain acting up again recently, stiffness, no falls, some relief with voltaren gel, cannot remember when last took tramadol, hasn't tried anything else  Flu shot today Has had colonoscopy by Dr. Deatra [name]  --> 10 year recall.   Review of Systems: Constitutional: Denies fever, chills, diaphoresis,  HEENT: Denies photophobia, eye pain, redness, hearing loss, ear pain, congestion, sore throat, rhinorrhea, sneezing, mouth sores, trouble swallowing, neck pain, neck stiffness and tinnitus.  Respiratory: Denies SOB and wheezing.   Gastrointestinal: Denies nausea, vomiting, abdominal pain, diarrhea, constipation,blood in stool and abdominal distention.  Genitourinary: Denies dysuria, urgency, frequency, hematuria, flank pain and difficulty urinating.   Past Medical History  Diagnosis Date  . Diabetes mellitus     Diagnosed in 2002, started insulin in 2012  . Hypertension   . Headache(784.0)     CT head 08/2011: Periventricular and subcortical white matter hypodensities are most in keeping with chronic microangiopathic change  . Gout   . Hyperlipidemia   . HIV infection Nov 2012    Followed by Dr. Johnnye [name]  . Crack cocaine use     for 20+ years, has been enrolled  in detox programs in the past  . Chondromalacia of medial femoral condyle     Left knee MRI 04/28/12: Chondromalacia of the medial femoral condyle with slight peripheral degeneration of the meniscocapsular junction of the medial meniscus; followed by sports medicine  . Asthma     No PFTs, history of childhood asthma  . Depression     with history of hospitalization for suicidal ideation  . CAD (coronary artery disease) 06/10/2013    Presented as STEMI; s/p BMS to LAD; done at [facility] (LAD prox 95% with thrombus, mid 75%, D2 75%; LCX OM2 75%; RCA small, PDA 95%, PLV 95%)   Current Outpatient Prescriptions  Medication Sig Dispense Refill  . allopurinol (ZYLOPRIM) 100 MG tablet Take 200 mg by mouth every morning. For gout.      Marland Kitchen amantadine (SYMMETREL) 100 MG capsule       . aspirin 81 MG chewable tablet Chew 81 mg by mouth daily. For blood thinner.      . COMPLERA 200-25-300 MG TABS TAKE 1 TABLET BY MOUTH DAILY  30 tablet  0  . diltiazem (CARDIZEM CD) 240 MG 24 hr capsule Take 240 mg by mouth daily.      . DULoxetine (CYMBALTA) 60 MG capsule Take 60 mg by mouth daily.      Marland Kitchen FREESTYLE LITE test strip USE DAILY TO TEST BLOOD SUGAR AS DIRECTED  100 each  11  . gabapentin (NEURONTIN) 300 MG capsule TAKE 1 CAPSULE BY MOUTH THREE   TIMES A DAY  90 capsule  0  .  insulin aspart (NOVOLOG) 100 UNIT/ML injection Inject 4units before breakfast & lunch; Inject 8units before dinner  30 mL  0  . insulin glargine (LANTUS) 100 UNIT/ML injection Inject 32 units daily before bed  10 mL  3  . lisinopril (PRINIVIL,ZESTRIL) 5 MG tablet Take 1 tablet (5 mg total) by mouth daily.  30 tablet  11  . metFORMIN (GLUCOPHAGE) 1000 MG tablet Take 1 tablet (1,000 mg total) by mouth 2 (two) times daily with a meal.  180 tablet  4  . metoprolol succinate (TOPROL-XL) 25 MG 24 hr tablet TAKE 1 TABLET (25 MG TOTAL) BY MOUTH DAILY.  30 tablet  2  . PARoxetine (PAXIL) 20 MG tablet Take 20 mg by mouth every morning.      .  pravastatin (PRAVACHOL) 40 MG tablet TAKE 1 TABLET BY MOUTH EVERY EVENING  30 tablet  3  . PROVENTIL HFA 108 (90 BASE) MCG/ACT inhaler INHALE TWO   PUFFS INTO THE LUNGS EVERY 6 (SIX) HOURS AS NEEDED FOR WHEEZING.  6.7 g  5  . sildenafil (VIAGRA) 50 MG tablet Take 1 tablet (50 mg total) by mouth as needed for erectile dysfunction.  20 tablet  1  . traMADol (ULTRAM) 50 MG tablet       . VOLTAREN 1 % GEL AAPLY TO AFFECTED AREA 4 TIMES A DAY  100 g  0   No current facility-administered medications for this visit.   Family History  Problem Relation Age of Onset  . Diabetes Mother   . Diabetes Father   . Diabetes Brother   . Colon cancer Neg Hx    History   Social History  . Marital Status: Single    Spouse Name: N/A    Number of Children: N/A  . Years of Education: N/A   Social History Main Topics  . Smoking status: Never Smoker   . Smokeless tobacco: Never Used  . Alcohol Use: No  . Drug Use: Yes    Special: "Crack" cocaine, Cocaine     Comment: last use 2 months ago  . Sexual Activity: No     Comment: accepted condoms   Other Topics Concern  . None   Social History Narrative  . None   Objective:  Physical Exam: Filed Vitals:   06/24/13 1315  BP: 118/77  Pulse: 84  Temp: 97.9 F (36.6 C)  TempSrc: Oral  Weight: 180 lb 3.2 oz (81.738 kg)  SpO2: 100%   HEENT: PERRL, EOMI, no scleral icterus Cardiac: RRR, no rubs, murmurs or gallops Pulm: clear to auscultation bilaterally, moving normal volumes of air Abd: soft, nontender, nondistended, BS normoactive Ext: warm and well perfused, no pedal edema Neuro: alert and oriented X3, cranial nerves II-XII grossly intact  Assessment & Plan:  Case and care discussed with Dr. Ellwood [name].  Please see problem oriented charting for further details. Patient to return in 1 month for further med reconciliation.

## 2013-06-25 NOTE — Progress Notes (Signed)
Case discussed with Dr. Sharda at the time of the visit.  We reviewed the resident's history and exam and pertinent patient test results.  I agree with the assessment, diagnosis, and plan of care documented in the resident's note.    

## 2013-06-29 ENCOUNTER — Other Ambulatory Visit: Payer: Self-pay | Admitting: Internal Medicine

## 2013-07-02 ENCOUNTER — Other Ambulatory Visit: Payer: Self-pay | Admitting: Infectious Diseases

## 2013-07-02 DIAGNOSIS — B20 Human immunodeficiency virus [HIV] disease: Secondary | ICD-10-CM

## 2013-07-05 ENCOUNTER — Other Ambulatory Visit: Payer: Self-pay | Admitting: *Deleted

## 2013-07-05 DIAGNOSIS — E1140 Type 2 diabetes mellitus with diabetic neuropathy, unspecified: Secondary | ICD-10-CM

## 2013-07-05 DIAGNOSIS — IMO0002 Reserved for concepts with insufficient information to code with codable children: Secondary | ICD-10-CM

## 2013-07-05 DIAGNOSIS — I10 Essential (primary) hypertension: Secondary | ICD-10-CM

## 2013-07-05 MED ORDER — LISINOPRIL 5 MG PO TABS: 5.0000 mg | ORAL_TABLET | Freq: Every day | ORAL | Status: DC

## 2013-07-28 ENCOUNTER — Encounter: Payer: Self-pay | Admitting: Internal Medicine

## 2013-08-06 ENCOUNTER — Other Ambulatory Visit: Payer: Self-pay | Admitting: *Deleted

## 2013-08-06 DIAGNOSIS — IMO0002 Reserved for concepts with insufficient information to code with codable children: Secondary | ICD-10-CM

## 2013-08-06 DIAGNOSIS — E1140 Type 2 diabetes mellitus with diabetic neuropathy, unspecified: Secondary | ICD-10-CM

## 2013-08-06 DIAGNOSIS — I10 Essential (primary) hypertension: Secondary | ICD-10-CM

## 2013-08-08 MED ORDER — LISINOPRIL 5 MG PO TABS: 5.0000 mg | ORAL_TABLET | Freq: Every day | ORAL | Status: DC

## 2013-09-07 ENCOUNTER — Other Ambulatory Visit: Payer: Self-pay | Admitting: Internal Medicine

## 2013-09-15 ENCOUNTER — Other Ambulatory Visit: Payer: Self-pay | Admitting: *Deleted

## 2013-09-15 DIAGNOSIS — E1140 Type 2 diabetes mellitus with diabetic neuropathy, unspecified: Secondary | ICD-10-CM

## 2013-09-15 DIAGNOSIS — IMO0002 Reserved for concepts with insufficient information to code with codable children: Secondary | ICD-10-CM

## 2013-09-15 DIAGNOSIS — F32A Depression, unspecified: Secondary | ICD-10-CM

## 2013-09-15 DIAGNOSIS — F329 Major depressive disorder, single episode, unspecified: Secondary | ICD-10-CM

## 2013-09-20 MED ORDER — INSULIN ASPART 100 UNIT/ML ~~LOC~~ SOLN: SUBCUTANEOUS | Status: DC

## 2013-09-20 MED ORDER — PAROXETINE HCL 20 MG PO TABS: 20.0000 mg | ORAL_TABLET | ORAL | Status: DC

## 2013-09-20 MED ORDER — DULOXETINE HCL 60 MG PO CPEP: 60.0000 mg | ORAL_CAPSULE | Freq: Every day | ORAL | Status: DC

## 2013-09-20 MED ORDER — ALLOPURINOL 100 MG PO TABS: 200.0000 mg | ORAL_TABLET | Freq: Every morning | ORAL | Status: DC

## 2013-10-15 DIAGNOSIS — Z9861 Coronary angioplasty status: Secondary | ICD-10-CM | POA: Insufficient documentation

## 2013-10-29 ENCOUNTER — Encounter: Payer: Self-pay | Admitting: Internal Medicine

## 2013-11-02 ENCOUNTER — Other Ambulatory Visit: Payer: Medicare Other

## 2013-11-02 DIAGNOSIS — B20 Human immunodeficiency virus [HIV] disease: Secondary | ICD-10-CM

## 2013-11-02 DIAGNOSIS — Z79899 Other long term (current) drug therapy: Secondary | ICD-10-CM

## 2013-11-02 LAB — CBC WITH DIFFERENTIAL/PLATELET
Basophils Absolute: 0 10*3/uL (ref 0.0–0.1)
Basophils Relative: 1 % (ref 0–1)
EOS ABS: 0.2 10*3/uL (ref 0.0–0.7)
Eosinophils Relative: 5 % (ref 0–5)
HEMATOCRIT: 35.5 % — AB (ref 39.0–52.0)
HEMOGLOBIN: 11.8 g/dL — AB (ref 13.0–17.0)
LYMPHS ABS: 0.9 10*3/uL (ref 0.7–4.0)
Lymphocytes Relative: 28 % (ref 12–46)
MCH: 28.4 pg (ref 26.0–34.0)
MCHC: 33.2 g/dL (ref 30.0–36.0)
MCV: 85.3 fL (ref 78.0–100.0)
MONO ABS: 0.3 10*3/uL (ref 0.1–1.0)
MONOS PCT: 8 % (ref 3–12)
NEUTROS PCT: 58 % (ref 43–77)
Neutro Abs: 1.8 10*3/uL (ref 1.7–7.7)
Platelets: 197 10*3/uL (ref 150–400)
RBC: 4.16 MIL/uL — AB (ref 4.22–5.81)
RDW: 14.9 % (ref 11.5–15.5)
WBC: 3.1 10*3/uL — ABNORMAL LOW (ref 4.0–10.5)

## 2013-11-02 LAB — COMPLETE METABOLIC PANEL WITH GFR
ALT: 10 U/L (ref 0–53)
AST: 16 U/L (ref 0–37)
Albumin: 4.2 g/dL (ref 3.5–5.2)
Alkaline Phosphatase: 69 U/L (ref 39–117)
BILIRUBIN TOTAL: 0.4 mg/dL (ref 0.3–1.2)
BUN: 15 mg/dL (ref 6–23)
CO2: 27 meq/L (ref 19–32)
Calcium: 9 mg/dL (ref 8.4–10.5)
Chloride: 104 mEq/L (ref 96–112)
Creat: 1.13 mg/dL (ref 0.50–1.35)
GFR, EST AFRICAN AMERICAN: 80 mL/min
GFR, Est Non African American: 69 mL/min
Glucose, Bld: 153 mg/dL — ABNORMAL HIGH (ref 70–99)
Potassium: 4.2 mEq/L (ref 3.5–5.3)
SODIUM: 138 meq/L (ref 135–145)
TOTAL PROTEIN: 7.1 g/dL (ref 6.0–8.3)

## 2013-11-02 LAB — LIPID PANEL
Cholesterol: 146 mg/dL (ref 0–200)
HDL: 42 mg/dL (ref >39)
LDL Cholesterol: 79 mg/dL (ref 0–99)
Total CHOL/HDL Ratio: 3.5 Ratio
Triglycerides: 124 mg/dL (ref <150)
VLDL: 25 mg/dL (ref 0–40)

## 2013-11-03 LAB — T-HELPER CELL (CD4) - (RCID CLINIC ONLY)
CD4 T CELL HELPER: 42 % (ref 33–55)
CD4 T Cell Abs: 380 /uL — ABNORMAL LOW (ref 400–2700)

## 2013-11-03 LAB — HIV-1 RNA QUANT-NO REFLEX-BLD
HIV 1 RNA Quant: <20 copies/mL (ref <20)
HIV-1 RNA Quant, Log: <1.3 {Log} (ref <1.30)

## 2013-11-11 ENCOUNTER — Encounter: Payer: Self-pay | Admitting: Cardiology

## 2013-11-13 IMAGING — CR DG CHEST 2V
2 series · 2 of 2 positions shown · non-contrast
Comparison: 12/05/2011.

CLINICAL DATA: Palpitations and asthma.  Cough.

CHEST - 2 VIEW

[w chest pa]
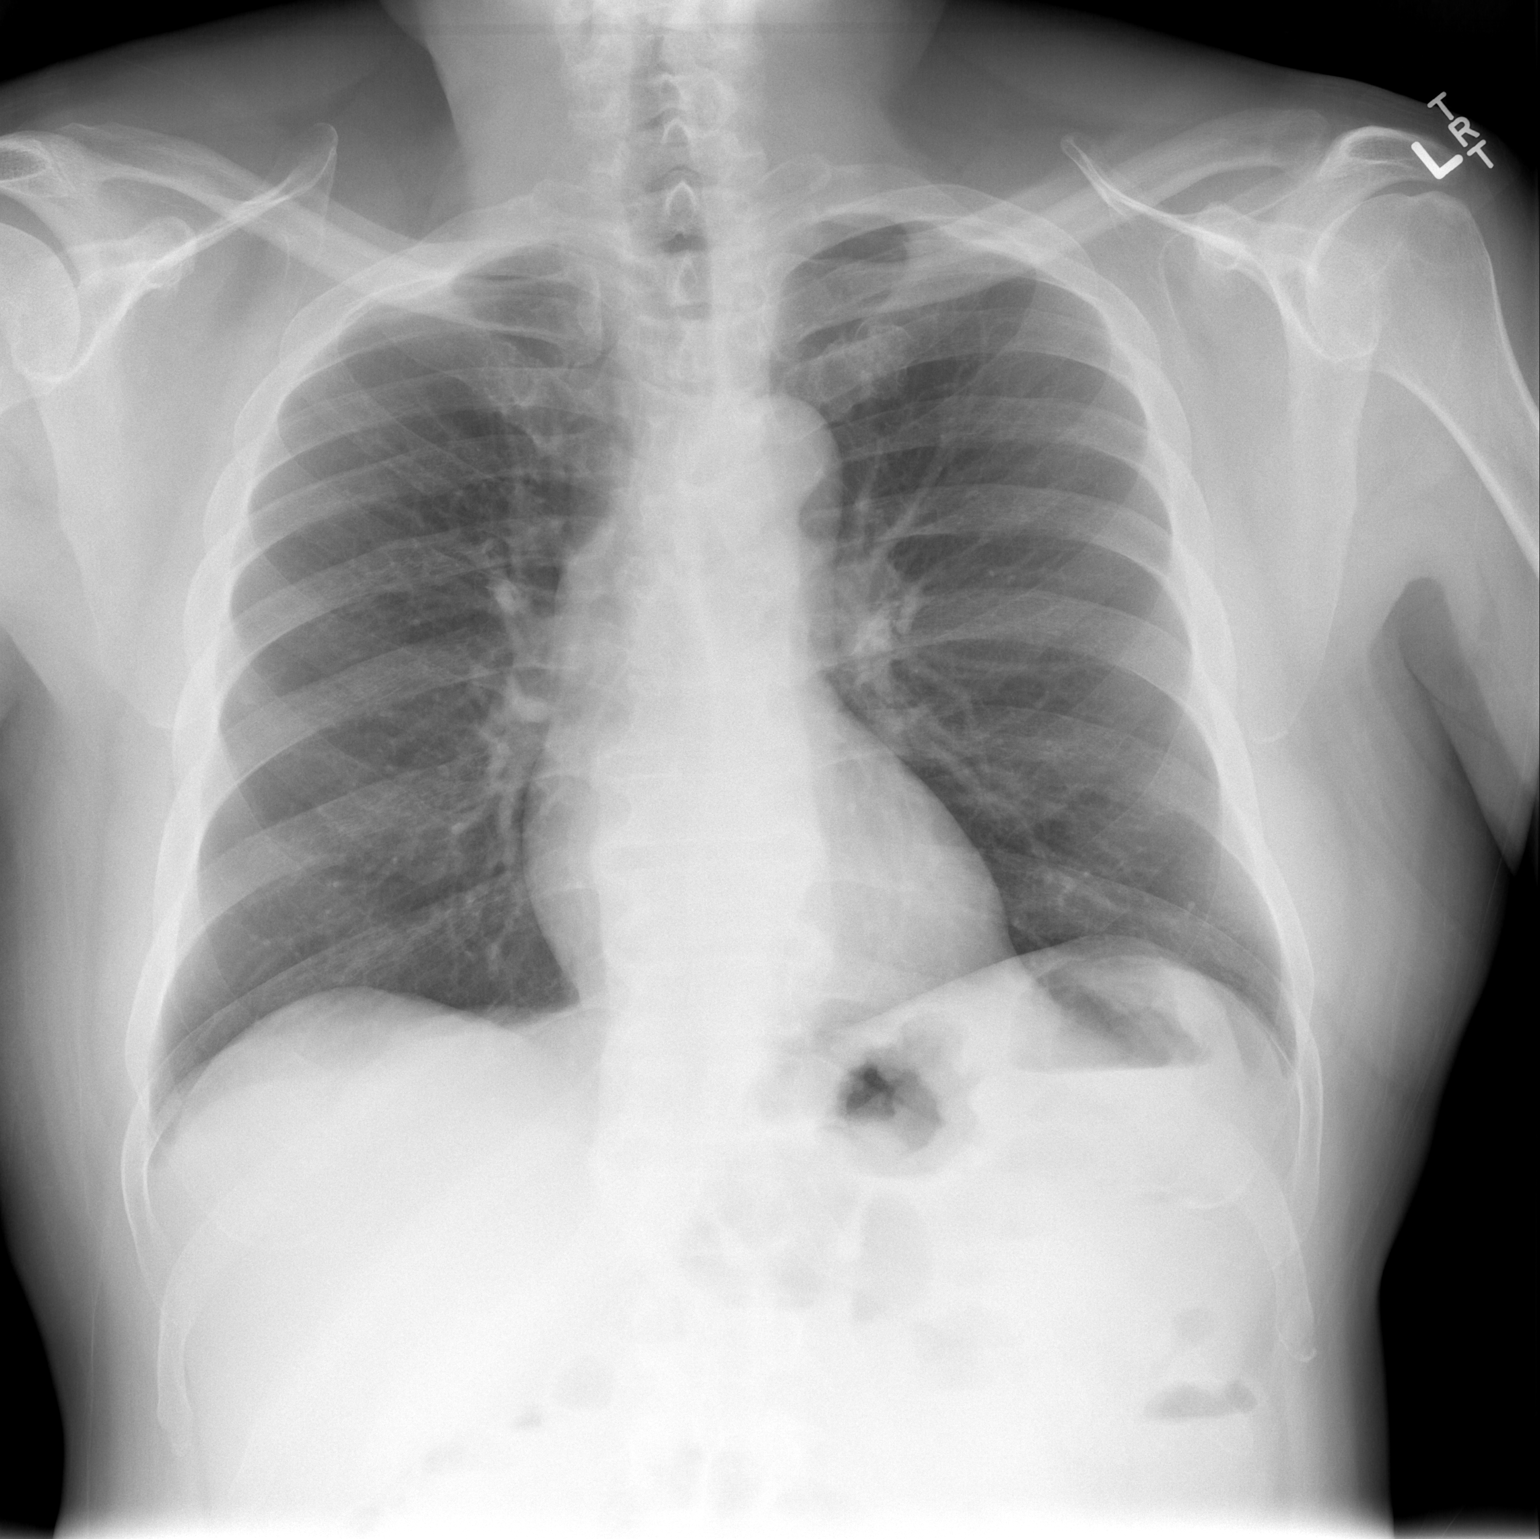

[w chest lat]
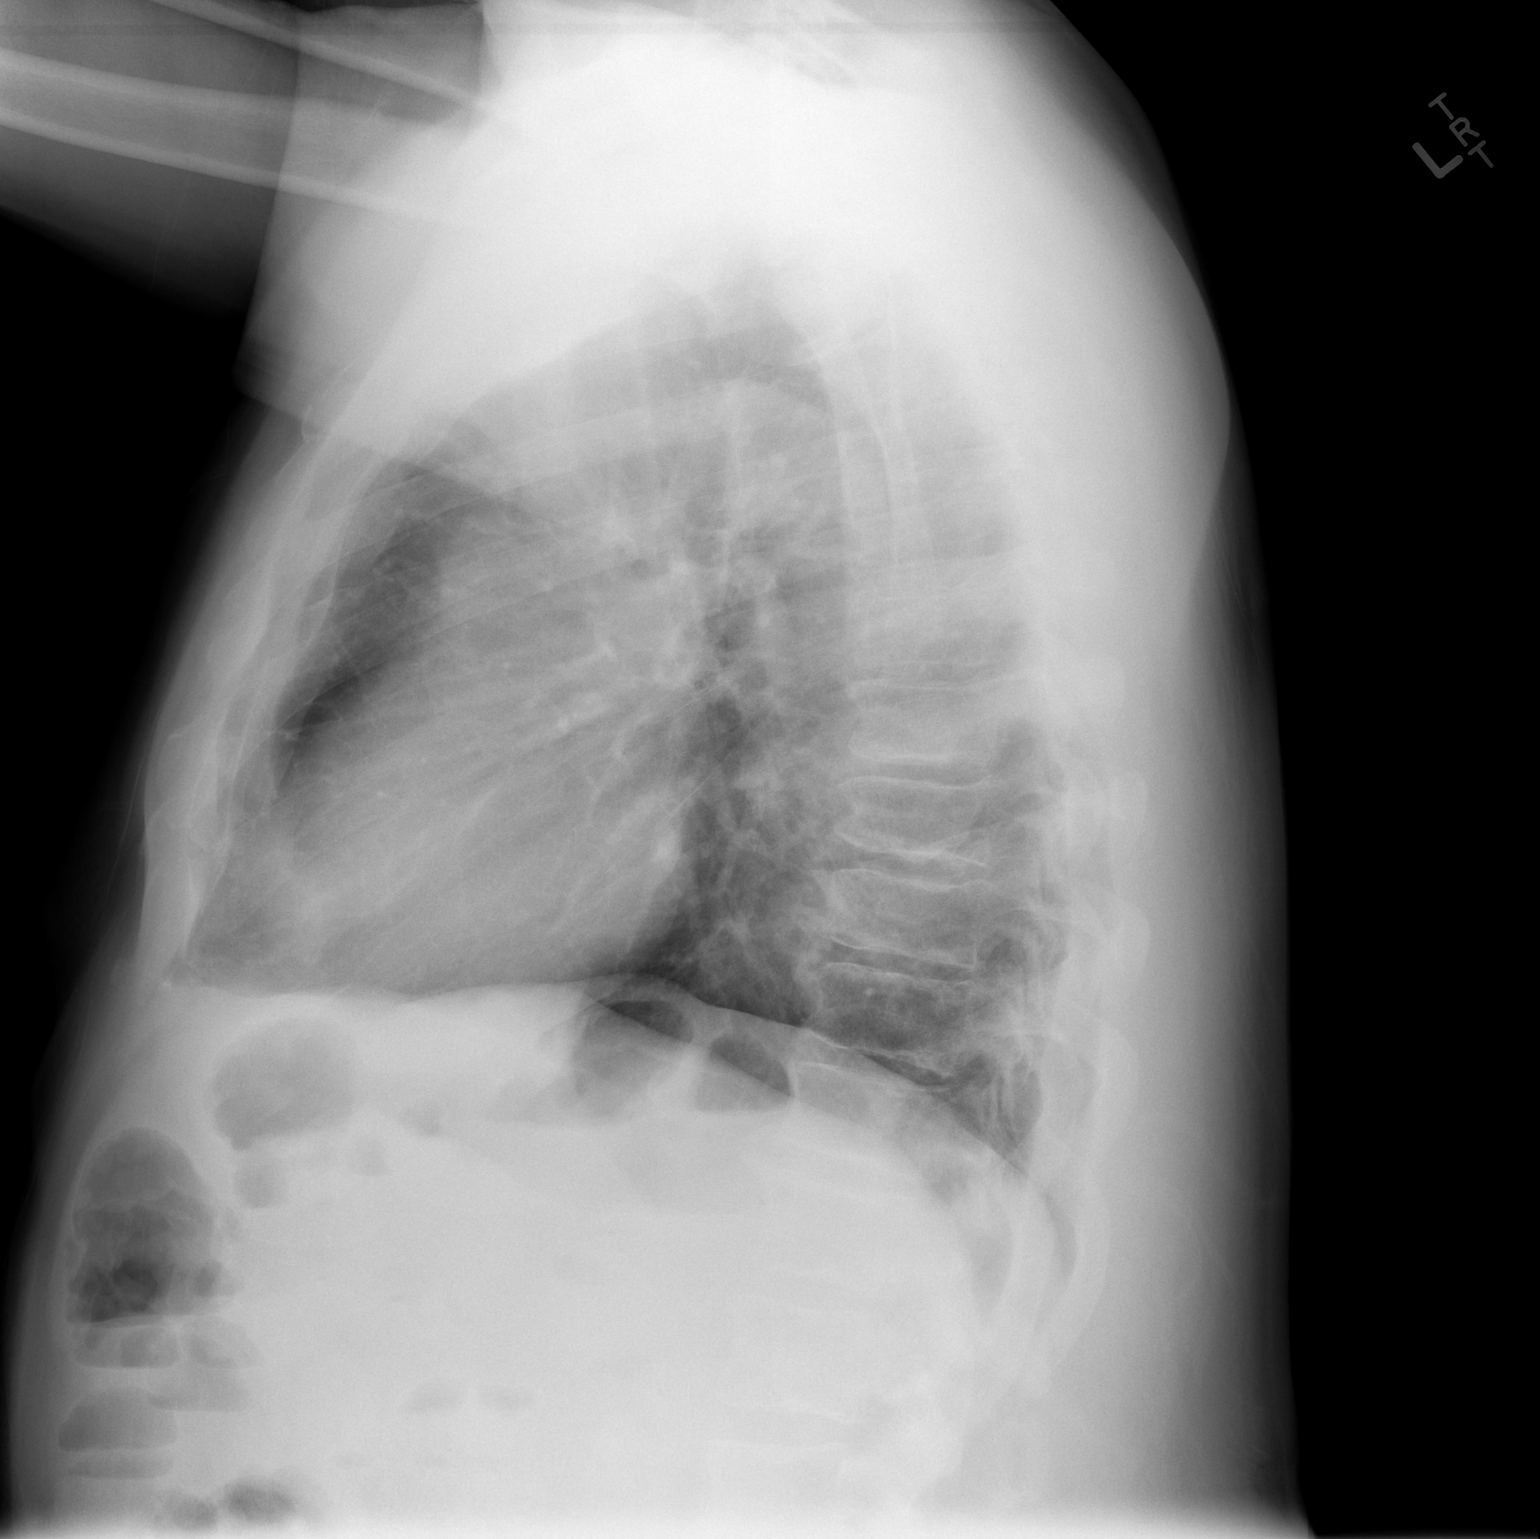

[2 of 2 positions shown; findings below may reference images not displayed]

FINDINGS: Trachea is midline.  Heart size normal.  Lungs are clear.
No pleural fluid.  Degenerative changes are seen in the spine.
IMPRESSION: No acute findings.

## 2013-11-17 ENCOUNTER — Ambulatory Visit: Payer: Self-pay | Admitting: Infectious Diseases

## 2013-11-17 ENCOUNTER — Telehealth: Payer: Self-pay | Admitting: *Deleted

## 2013-11-17 NOTE — Telephone Encounter (Signed)
Left message on patient's voicemail informing him of his missed appointment. Asked him to call and reschedule at his earliest convenience. Landis Gandy, RN

## 2013-12-17 ENCOUNTER — Other Ambulatory Visit: Payer: Self-pay | Admitting: *Deleted

## 2013-12-17 ENCOUNTER — Other Ambulatory Visit: Payer: Self-pay | Admitting: Internal Medicine

## 2013-12-17 DIAGNOSIS — B20 Human immunodeficiency virus [HIV] disease: Secondary | ICD-10-CM

## 2013-12-17 MED ORDER — EMTRICITAB-RILPIVIR-TENOFOV DF 200-25-300 MG PO TABS: 1.0000 | ORAL_TABLET | Freq: Every day | ORAL | Status: DC

## 2013-12-23 ENCOUNTER — Other Ambulatory Visit: Payer: Self-pay | Admitting: *Deleted

## 2013-12-23 NOTE — Telephone Encounter (Signed)
Complera rx - must keep March 23rd appt w/ [name].  Pharmacy notified.

## 2013-12-24 ENCOUNTER — Other Ambulatory Visit: Payer: Self-pay | Admitting: *Deleted

## 2013-12-27 ENCOUNTER — Ambulatory Visit (INDEPENDENT_AMBULATORY_CARE_PROVIDER_SITE_OTHER): Payer: Medicare Other | Admitting: Infectious Diseases

## 2013-12-27 ENCOUNTER — Encounter: Payer: Self-pay | Admitting: Infectious Diseases

## 2013-12-27 VITALS — BP 134/79 | HR 76 | Temp 97.9°F | Ht 67.5 in | Wt 184.0 lb

## 2013-12-27 DIAGNOSIS — Z113 Encounter for screening for infections with a predominantly sexual mode of transmission: Secondary | ICD-10-CM

## 2013-12-27 DIAGNOSIS — N529 Male erectile dysfunction, unspecified: Secondary | ICD-10-CM

## 2013-12-27 DIAGNOSIS — E1140 Type 2 diabetes mellitus with diabetic neuropathy, unspecified: Secondary | ICD-10-CM

## 2013-12-27 DIAGNOSIS — I2510 Atherosclerotic heart disease of native coronary artery without angina pectoris: Secondary | ICD-10-CM

## 2013-12-27 DIAGNOSIS — Z21 Asymptomatic human immunodeficiency virus [HIV] infection status: Secondary | ICD-10-CM

## 2013-12-27 DIAGNOSIS — F1410 Cocaine abuse, uncomplicated: Secondary | ICD-10-CM

## 2013-12-27 DIAGNOSIS — E1142 Type 2 diabetes mellitus with diabetic polyneuropathy: Secondary | ICD-10-CM

## 2013-12-27 DIAGNOSIS — E1149 Type 2 diabetes mellitus with other diabetic neurological complication: Secondary | ICD-10-CM

## 2013-12-27 DIAGNOSIS — E1165 Type 2 diabetes mellitus with hyperglycemia: Secondary | ICD-10-CM

## 2013-12-27 DIAGNOSIS — B20 Human immunodeficiency virus [HIV] disease: Secondary | ICD-10-CM

## 2013-12-27 DIAGNOSIS — IMO0002 Reserved for concepts with insufficient information to code with codable children: Secondary | ICD-10-CM

## 2013-12-27 NOTE — Assessment & Plan Note (Signed)
Has been off cocaine since MI

## 2013-12-27 NOTE — Assessment & Plan Note (Signed)
he is doing well, is CP free. Will f/u with his PCP.

## 2013-12-27 NOTE — Assessment & Plan Note (Signed)
Greatly appreciate PCP, will get him plugged in with optho.

## 2013-12-27 NOTE — Assessment & Plan Note (Signed)
Pt unable to obtain complete erection. Will defer this to his PCP, avoid viagra since he has CAD.

## 2013-12-27 NOTE — Assessment & Plan Note (Signed)
Doing very well. vax up to date. Offered/refused condoms. Will continue his current meds, his Cr is normal. Will see him back in 6 months.

## 2013-12-27 NOTE — Addendum Note (Signed)
Addended by: Boen Sterbenz C on: 12/27/2013 09:25 AM   Modules accepted: Orders

## 2013-12-27 NOTE — Progress Notes (Signed)
   Subjective:    Patient ID: Michael [name], male    DOB: 08-Jun-1951, 63 y.o.   MRN: [id number]  HPI 63 yo M with HIV+ (CD4 350 VL 44,500 November 2012). Started on complera.  He also has a hx of DM, HTN, hyperlipidemia, Gout, and previous cocaine use. He had MI in September 2014 and had 4 stents done at that time.  Has been feeling well lately. No problems with his medications. Has new PCP in [place] now since he has moved there.  Denies missed ART. Has not had ophtho yet.   Review of Systems  Constitutional: Negative for appetite change and unexpected weight change.  Respiratory: Negative for cough and shortness of breath.   Cardiovascular: Negative for chest pain.  Gastrointestinal: Negative for diarrhea and constipation.  Genitourinary: Negative for difficulty urinating.  Neurological: Positive for numbness.    HIV 1 RNA Quant (copies/mL)  Date Value  11/02/2013 <20   04/07/2013 <20   07/27/2012 <20      CD4 T Cell Abs (/uL)  Date Value  11/02/2013 380*  04/07/2013 450   07/27/2012 360*         Objective:   Physical Exam  Constitutional: He appears well-developed and well-nourished.  HENT:  Mouth/Throat: No oropharyngeal exudate.  Eyes: EOM are normal. Pupils are equal, round, and reactive to light.  Neck: Neck supple.  Cardiovascular: Normal rate, regular rhythm and normal heart sounds.   Pulmonary/Chest: Effort normal and breath sounds normal.  Abdominal: Soft. Bowel sounds are normal. He exhibits no distension. There is no tenderness.  Musculoskeletal:  No diabetic foot lesions.   Lymphadenopathy:    He has no cervical adenopathy.  Skin: Skin is warm and dry. No rash noted.          Assessment & Plan:

## 2013-12-28 NOTE — Telephone Encounter (Signed)
This was taken off med list yesterday by [facility]. Will send to [facility] for review.

## 2013-12-29 MED ORDER — DULOXETINE HCL 60 MG PO CPEP: 60.0000 mg | ORAL_CAPSULE | Freq: Every day | ORAL | Status: DC

## 2014-01-17 ENCOUNTER — Telehealth: Payer: Self-pay | Admitting: *Deleted

## 2014-01-17 NOTE — Telephone Encounter (Signed)
Patient stopped by the office to request a letter for [facility] so that he can stay on site during the day. He advised that Dr [name] knows his disabilities and will write him a note. Advised the patient Dr [name] is in clinic at this time but will get him a message and once the letter is done if he will do it we will give him a call. The patient advised he has not minutes on his phone so the will call back tomorrow.

## 2014-01-18 ENCOUNTER — Encounter: Payer: Self-pay | Admitting: Infectious Diseases

## 2014-01-18 NOTE — Telephone Encounter (Signed)
Dr [name] wrote the note and it is in an envelope at the front desk for the patient to pick up. Please inform the patient if he calls back.

## 2014-01-18 NOTE — Telephone Encounter (Signed)
Patient called back regarding this letter. Please ask [name] this afternoon if he is ok with providing this. Myrtis Hopping

## 2014-01-27 ENCOUNTER — Inpatient Hospital Stay (HOSPITAL_COMMUNITY)
Admission: EM | Admit: 2014-01-27 | Discharge: 2014-01-31 | DRG: 202 | Disposition: A | Discharge status: Home / Self Care | Payer: Medicare Other | Attending: Internal Medicine | Admitting: Internal Medicine

## 2014-01-27 ENCOUNTER — Emergency Department (HOSPITAL_COMMUNITY): Payer: Medicare Other

## 2014-01-27 ENCOUNTER — Telehealth: Payer: Self-pay | Admitting: *Deleted

## 2014-01-27 ENCOUNTER — Encounter (HOSPITAL_COMMUNITY): Payer: Self-pay | Admitting: Emergency Medicine

## 2014-01-27 DIAGNOSIS — M109 Gout, unspecified: Secondary | ICD-10-CM | POA: Diagnosis present

## 2014-01-27 DIAGNOSIS — Z833 Family history of diabetes mellitus: Secondary | ICD-10-CM

## 2014-01-27 DIAGNOSIS — F1410 Cocaine abuse, uncomplicated: Secondary | ICD-10-CM | POA: Diagnosis present

## 2014-01-27 DIAGNOSIS — E1149 Type 2 diabetes mellitus with other diabetic neurological complication: Secondary | ICD-10-CM | POA: Diagnosis present

## 2014-01-27 DIAGNOSIS — E1142 Type 2 diabetes mellitus with diabetic polyneuropathy: Secondary | ICD-10-CM

## 2014-01-27 DIAGNOSIS — E1140 Type 2 diabetes mellitus with diabetic neuropathy, unspecified: Secondary | ICD-10-CM | POA: Diagnosis present

## 2014-01-27 DIAGNOSIS — F3289 Other specified depressive episodes: Secondary | ICD-10-CM | POA: Diagnosis present

## 2014-01-27 DIAGNOSIS — E785 Hyperlipidemia, unspecified: Secondary | ICD-10-CM | POA: Diagnosis present

## 2014-01-27 DIAGNOSIS — E1165 Type 2 diabetes mellitus with hyperglycemia: Secondary | ICD-10-CM

## 2014-01-27 DIAGNOSIS — I10 Essential (primary) hypertension: Secondary | ICD-10-CM | POA: Diagnosis present

## 2014-01-27 DIAGNOSIS — M2240 Chondromalacia patellae, unspecified knee: Secondary | ICD-10-CM | POA: Diagnosis present

## 2014-01-27 DIAGNOSIS — F329 Major depressive disorder, single episode, unspecified: Secondary | ICD-10-CM | POA: Diagnosis present

## 2014-01-27 DIAGNOSIS — Z9861 Coronary angioplasty status: Secondary | ICD-10-CM

## 2014-01-27 DIAGNOSIS — I2510 Atherosclerotic heart disease of native coronary artery without angina pectoris: Secondary | ICD-10-CM | POA: Diagnosis present

## 2014-01-27 DIAGNOSIS — B20 Human immunodeficiency virus [HIV] disease: Secondary | ICD-10-CM | POA: Diagnosis present

## 2014-01-27 DIAGNOSIS — J45901 Unspecified asthma with (acute) exacerbation: Principal | ICD-10-CM | POA: Diagnosis present

## 2014-01-27 DIAGNOSIS — IMO0002 Reserved for concepts with insufficient information to code with codable children: Secondary | ICD-10-CM | POA: Diagnosis present

## 2014-01-27 DIAGNOSIS — R079 Chest pain, unspecified: Secondary | ICD-10-CM | POA: Diagnosis present

## 2014-01-27 LAB — BLOOD GAS, ARTERIAL
ACID-BASE DEFICIT: 2.5 mmol/L — AB (ref 0.0–2.0)
Bicarbonate: 21.5 mEq/L (ref 20.0–24.0)
Drawn by: 249101
O2 CONTENT: 2 L/min
O2 Saturation: 98.3 %
PCO2 ART: 35 mmHg (ref 35.0–45.0)
Patient temperature: 98.6
TCO2: 22.6 mmol/L (ref 0–100)
pH, Arterial: 7.405 (ref 7.350–7.450)
pO2, Arterial: 108 mmHg — ABNORMAL HIGH (ref 80.0–100.0)

## 2014-01-27 LAB — CBC WITH DIFFERENTIAL/PLATELET
Basophils Absolute: 0 10*3/uL (ref 0.0–0.1)
Basophils Relative: 1 % (ref 0–1)
EOS ABS: 0.1 10*3/uL (ref 0.0–0.7)
EOS PCT: 4 % (ref 0–5)
HCT: 30.4 % — ABNORMAL LOW (ref 39.0–52.0)
HEMOGLOBIN: 10 g/dL — AB (ref 13.0–17.0)
Lymphocytes Relative: 26 % (ref 12–46)
Lymphs Abs: 0.9 10*3/uL (ref 0.7–4.0)
MCH: 27.9 pg (ref 26.0–34.0)
MCHC: 32.9 g/dL (ref 30.0–36.0)
MCV: 84.7 fL (ref 78.0–100.0)
MONOS PCT: 10 % (ref 3–12)
Monocytes Absolute: 0.3 10*3/uL (ref 0.1–1.0)
NEUTROS PCT: 59 % (ref 43–77)
Neutro Abs: 1.9 10*3/uL (ref 1.7–7.7)
Platelets: 135 10*3/uL — ABNORMAL LOW (ref 150–400)
RBC: 3.59 MIL/uL — ABNORMAL LOW (ref 4.22–5.81)
RDW: 13.4 % (ref 11.5–15.5)
WBC: 3.3 10*3/uL — ABNORMAL LOW (ref 4.0–10.5)

## 2014-01-27 LAB — RAPID URINE DRUG SCREEN, HOSP PERFORMED
Amphetamines: NOT DETECTED
Barbiturates: NOT DETECTED
Benzodiazepines: NOT DETECTED
Cocaine: NOT DETECTED
OPIATES: NOT DETECTED
TETRAHYDROCANNABINOL: NOT DETECTED

## 2014-01-27 LAB — BASIC METABOLIC PANEL
BUN: 17 mg/dL (ref 6–23)
CALCIUM: 9.2 mg/dL (ref 8.4–10.5)
CO2: 20 mEq/L (ref 19–32)
Chloride: 105 mEq/L (ref 96–112)
Creatinine, Ser: 1.16 mg/dL (ref 0.50–1.35)
GFR calc Af Amer: 76 mL/min — ABNORMAL LOW (ref >90)
GFR, EST NON AFRICAN AMERICAN: 65 mL/min — AB (ref >90)
Glucose, Bld: 204 mg/dL — ABNORMAL HIGH (ref 70–99)
POTASSIUM: 3.8 meq/L (ref 3.7–5.3)
Sodium: 141 mEq/L (ref 137–147)

## 2014-01-27 LAB — TROPONIN I: Troponin I: <0.3 ng/mL (ref <0.30)

## 2014-01-27 LAB — GLUCOSE, CAPILLARY
GLUCOSE-CAPILLARY: 140 mg/dL — AB (ref 70–99)
GLUCOSE-CAPILLARY: 330 mg/dL — AB (ref 70–99)

## 2014-01-27 LAB — PRO B NATRIURETIC PEPTIDE: Pro B Natriuretic peptide (BNP): 227.4 pg/mL — ABNORMAL HIGH (ref 0–125)

## 2014-01-27 LAB — MRSA PCR SCREENING: MRSA BY PCR: NEGATIVE

## 2014-01-27 LAB — POC OCCULT BLOOD, ED: FECAL OCCULT BLD: NEGATIVE

## 2014-01-27 MED ORDER — GABAPENTIN 300 MG PO CAPS
300.0000 mg | ORAL_CAPSULE | Freq: Three times a day (TID) | ORAL | Status: DC
  Administered 2014-01-27 – 2014-01-31 (×13): 300 mg via ORAL
  Filled 2014-01-27 (×15): qty 1

## 2014-01-27 MED ORDER — ONDANSETRON HCL 4 MG/2ML IJ SOLN
4.0000 mg | Freq: Three times a day (TID) | INTRAMUSCULAR | Status: AC | PRN
Start: 2014-01-27 — End: 2014-01-28

## 2014-01-27 MED ORDER — GUAIFENESIN 100 MG/5ML PO SOLN
5.0000 mL | Freq: Four times a day (QID) | ORAL | Status: DC | PRN
  Filled 2014-01-27: qty 5

## 2014-01-27 MED ORDER — ALBUTEROL SULFATE (2.5 MG/3ML) 0.083% IN NEBU
5.0000 mg | INHALATION_SOLUTION | Freq: Once | RESPIRATORY_TRACT | Status: AC
  Administered 2014-01-27: 5 mg via RESPIRATORY_TRACT
  Filled 2014-01-27: qty 6

## 2014-01-27 MED ORDER — DULOXETINE HCL 60 MG PO CPEP
60.0000 mg | ORAL_CAPSULE | Freq: Every day | ORAL | Status: DC
  Administered 2014-01-28 – 2014-01-31 (×4): 60 mg via ORAL
  Filled 2014-01-27 (×4): qty 1

## 2014-01-27 MED ORDER — SIMVASTATIN 20 MG PO TABS: 20.0000 mg | ORAL_TABLET | Freq: Every day | ORAL | Status: DC

## 2014-01-27 MED ORDER — ALBUTEROL SULFATE (2.5 MG/3ML) 0.083% IN NEBU: 2.5000 mg | INHALATION_SOLUTION | Freq: Four times a day (QID) | RESPIRATORY_TRACT | Status: DC | PRN

## 2014-01-27 MED ORDER — GUAIFENESIN-DM 100-10 MG/5ML PO SYRP
5.0000 mL | ORAL_SOLUTION | ORAL | Status: DC
  Administered 2014-01-27 – 2014-01-30 (×15): 5 mL via ORAL
  Filled 2014-01-27 (×21): qty 5

## 2014-01-27 MED ORDER — ENOXAPARIN SODIUM 40 MG/0.4ML ~~LOC~~ SOLN
40.0000 mg | SUBCUTANEOUS | Status: DC
  Administered 2014-01-27 – 2014-01-30 (×4): 40 mg via SUBCUTANEOUS
  Filled 2014-01-27 (×5): qty 0.4

## 2014-01-27 MED ORDER — TRAMADOL HCL 50 MG PO TABS: 50.0000 mg | ORAL_TABLET | Freq: Four times a day (QID) | ORAL | Status: DC | PRN

## 2014-01-27 MED ORDER — SODIUM CHLORIDE 0.9 % IJ SOLN
3.0000 mL | Freq: Two times a day (BID) | INTRAMUSCULAR | Status: DC
  Administered 2014-01-27 – 2014-01-31 (×8): 3 mL via INTRAVENOUS

## 2014-01-27 MED ORDER — DILTIAZEM HCL ER COATED BEADS 120 MG PO CP24
120.0000 mg | ORAL_CAPSULE | Freq: Every day | ORAL | Status: DC
  Administered 2014-01-28 – 2014-01-31 (×4): 120 mg via ORAL
  Filled 2014-01-27 (×4): qty 1

## 2014-01-27 MED ORDER — ALBUTEROL SULFATE HFA 108 (90 BASE) MCG/ACT IN AERS
2.0000 | INHALATION_SPRAY | Freq: Four times a day (QID) | RESPIRATORY_TRACT | Status: DC | PRN
Start: 2014-01-27 — End: 2014-01-27

## 2014-01-27 MED ORDER — PNEUMOCOCCAL VAC POLYVALENT 25 MCG/0.5ML IJ INJ
0.5000 mL | INJECTION | INTRAMUSCULAR | Status: AC
  Administered 2014-01-28: 0.5 mL via INTRAMUSCULAR
  Filled 2014-01-27: qty 0.5

## 2014-01-27 MED ORDER — IPRATROPIUM-ALBUTEROL 0.5-2.5 (3) MG/3ML IN SOLN
3.0000 mL | RESPIRATORY_TRACT | Status: DC
  Administered 2014-01-27 (×4): 3 mL via RESPIRATORY_TRACT
  Filled 2014-01-27 (×4): qty 3

## 2014-01-27 MED ORDER — EMTRICITABINE-TENOFOVIR DF 200-300 MG PO TABS
1.0000 | ORAL_TABLET | Freq: Every day | ORAL | Status: DC
  Administered 2014-01-27 – 2014-01-31 (×5): 1 via ORAL
  Filled 2014-01-27 (×5): qty 1

## 2014-01-27 MED ORDER — ALLOPURINOL 100 MG PO TABS
200.0000 mg | ORAL_TABLET | Freq: Every morning | ORAL | Status: DC
  Administered 2014-01-28 – 2014-01-31 (×4): 200 mg via ORAL
  Filled 2014-01-27 (×4): qty 2

## 2014-01-27 MED ORDER — IPRATROPIUM BROMIDE 0.02 % IN SOLN
0.5000 mg | Freq: Once | RESPIRATORY_TRACT | Status: AC
Start: 2014-01-27 — End: 2014-01-27
  Administered 2014-01-27: 0.5 mg via RESPIRATORY_TRACT
  Filled 2014-01-27: qty 2.5

## 2014-01-27 MED ORDER — MORPHINE SULFATE 2 MG/ML IJ SOLN
2.0000 mg | INTRAMUSCULAR | Status: DC | PRN
  Administered 2014-01-27: 2 mg via INTRAVENOUS
  Filled 2014-01-27: qty 1

## 2014-01-27 MED ORDER — EMTRICITAB-RILPIVIR-TENOFOV DF 200-25-300 MG PO TABS
1.0000 | ORAL_TABLET | Freq: Every day | ORAL | Status: DC
Start: 2014-01-27 — End: 2014-01-27

## 2014-01-27 MED ORDER — MAGNESIUM SULFATE 40 MG/ML IJ SOLN
2.0000 g | Freq: Once | INTRAMUSCULAR | Status: AC
  Administered 2014-01-27: 2 g via INTRAVENOUS
  Filled 2014-01-27: qty 50

## 2014-01-27 MED ORDER — IPRATROPIUM-ALBUTEROL 0.5-2.5 (3) MG/3ML IN SOLN
3.0000 mL | RESPIRATORY_TRACT | Status: DC
  Administered 2014-01-28 – 2014-01-29 (×12): 3 mL via RESPIRATORY_TRACT
  Filled 2014-01-27 (×12): qty 3

## 2014-01-27 MED ORDER — INSULIN GLARGINE 100 UNIT/ML ~~LOC~~ SOLN
20.0000 [IU] | Freq: Every day | SUBCUTANEOUS | Status: DC
  Administered 2014-01-27: 20 [IU] via SUBCUTANEOUS
  Filled 2014-01-27 (×2): qty 0.2

## 2014-01-27 MED ORDER — METHYLPREDNISOLONE SODIUM SUCC 125 MG IJ SOLR
80.0000 mg | Freq: Every day | INTRAMUSCULAR | Status: DC
  Administered 2014-01-27 – 2014-01-28 (×2): 80 mg via INTRAVENOUS
  Filled 2014-01-27: qty 1.28
  Filled 2014-01-27: qty 2

## 2014-01-27 MED ORDER — CLOPIDOGREL BISULFATE 75 MG PO TABS
75.0000 mg | ORAL_TABLET | Freq: Every day | ORAL | Status: DC
  Administered 2014-01-28 – 2014-01-31 (×4): 75 mg via ORAL
  Filled 2014-01-27 (×5): qty 1

## 2014-01-27 MED ORDER — ASPIRIN 81 MG PO CHEW
81.0000 mg | CHEWABLE_TABLET | Freq: Every day | ORAL | Status: DC
  Administered 2014-01-28 – 2014-01-31 (×4): 81 mg via ORAL
  Filled 2014-01-27 (×4): qty 1

## 2014-01-27 MED ORDER — INSULIN ASPART 100 UNIT/ML ~~LOC~~ SOLN
0.0000 [IU] | Freq: Three times a day (TID) | SUBCUTANEOUS | Status: DC
  Administered 2014-01-27: 1 [IU] via SUBCUTANEOUS

## 2014-01-27 MED ORDER — REGADENOSON 0.4 MG/5ML IV SOLN
0.4000 mg | Freq: Once | INTRAVENOUS | Status: AC
  Filled 2014-01-27: qty 5

## 2014-01-27 MED ORDER — ATORVASTATIN CALCIUM 10 MG PO TABS
10.0000 mg | ORAL_TABLET | Freq: Every day | ORAL | Status: DC
  Administered 2014-01-27 – 2014-01-31 (×5): 10 mg via ORAL
  Filled 2014-01-27 (×5): qty 1

## 2014-01-27 MED ORDER — RILPIVIRINE HCL 25 MG PO TABS
25.0000 mg | ORAL_TABLET | Freq: Every day | ORAL | Status: DC
  Administered 2014-01-27 – 2014-01-31 (×5): 25 mg via ORAL
  Filled 2014-01-27 (×6): qty 1

## 2014-01-27 NOTE — ED Notes (Signed)
Attempted report 

## 2014-01-27 NOTE — Consult Note (Signed)
Chief Complaint: Chest Pain  Referring MD: IM Teaching Service  Primary Cardiologist: [facility]  HPI: The patient is a 63 y/o male with h/o CAD, cocaine use, HIV, HTN, HLD, DM and asthma who was admitted today by Internal Medicine for chest pain. Per records, he was hospitalized at [facility] 06/10/13-06/12/13, in which he presented with chest pain after multiple days of drug use and had an anterior STEMI. Given ASA 324 on arrival and taken emergently to cath lab where a he received a BMS to his proximal and distal LAD. In Jan of this year, he required repeat catheterization, also performed at [facility]. He was found to have 98% in-stent stenoses of proximal and mid LAD bare metal stents. Borderline OM2 lesion and diffuse mid and distal RCA lesions unchanged from last cath. The previously placed BMS with ISR were successfully treated with PCI utilizing 2 Promus Premier stents. He was placed on dual anti-platelet therapy with ASA + Plavix. Cardizem started in place of beta-blocker, given drug use. He was continued on home pravstatin. ACE-I was held given no evidence of systolic dysfunction.   He states that he had been doing fairly well, up until the last 2 months. He notes intermittent substernal chest pain/tightness radiating to the left arm. It occurs at rest but is worse with exertion. He notes associated DOE. Occasional LEE. No orthopnea or PND. He also notes a non productive cough x 4 days. His symptoms have progressively worsened, particularly in the last 4 days. He has had some relief with SL NTG. He reports daily compliance with the majority of his medications, including ASA and Plavix. However, he is not fully compliant with daily asthma meds. He denies tobacco use. He states that he has has not used cocaine in over 1 month.    Past Medical History   Diagnosis  Date   .  Diabetes mellitus      Diagnosed in 2002, started insulin in 2012   .  Hypertension    .  Headache(784.0)      CT head 08/2011:  Periventricular and subcortical white matter hypodensities are most in keeping with chronic microangiopathic change   .  Gout    .  Hyperlipidemia    .  HIV infection  Nov 2012     Followed by [name]   .  Crack cocaine use      for 20+ years, has been enrolled in detox programs in the past   .  Chondromalacia of medial femoral condyle      Left knee MRI 04/28/12: Chondromalacia of the medial femoral condyle with slight peripheral degeneration of the meniscocapsular junction of the medial meniscus; followed by sports medicine   .  Asthma      No PFTs, history of childhood asthma   .  Depression      with history of hospitalization for suicidal ideation   .  CAD (coronary artery disease)  06/10/2013     Presented as STEMI 06/10/13; s/p BMS to LAD; done at [facility] (LAD prox 95% with thrombus, mid 75%, D2 75%; LCX OM2 75%; RCA small, PDA 95%, PLV 95%)   .  Gout  04/28/2012    Past Surgical History   Procedure  Laterality  Date   .  Prostate surgery     .  Bowel resection     .  Back surgery       1988    Family History   Problem  Relation  Age of Onset   .  Diabetes  Mother    .  Diabetes  Father    .  Diabetes  Brother    .  Colon cancer  Neg Hx     Social History: reports that he has never smoked. He has never used smokeless tobacco. He reports that he does not drink alcohol or use illicit drugs.  Allergies: No Known Allergies  Medications Prior to Admission   Medication  Sig  Dispense  Refill   .  albuterol (PROAIR HFA) 108 (90 BASE) MCG/ACT inhaler  Inhale 2 puffs into the lungs every 6 (six) hours as needed for wheezing or shortness of breath.     .  allopurinol (ZYLOPRIM) 100 MG tablet  Take 2 tablets (200 mg total) by mouth every morning. For gout.  60 tablet  3   .  amantadine (SYMMETREL) 100 MG capsule  Take 100 mg by mouth daily.     Marland Kitchen  aspirin 81 MG chewable tablet  Chew 81 mg by mouth daily. For blood thinner.     .  clopidogrel (PLAVIX) 75 MG tablet  Take 75 mg by  mouth daily with breakfast.     .  diclofenac sodium (VOLTAREN) 1 % GEL  Apply 2 g topically 2 (two) times daily as needed (pain).     Marland Kitchen  diltiazem (CARDIZEM CD) 120 MG 24 hr capsule  Take 120 mg by mouth daily. Take 1 capsule (120 mg total) by mouth daily.     .  DULoxetine (CYMBALTA) 60 MG capsule  Take 1 capsule (60 mg total) by mouth daily.  30 capsule  1   .  Emtricitab-Rilpivir-Tenofovir (COMPLERA) 200-25-300 MG TABS  Take 1 tablet by mouth daily.  30 tablet  0   .  gabapentin (NEURONTIN) 300 MG capsule  Take 300 mg by mouth 3 (three) times daily.     .  insulin glargine (LANTUS) 100 UNIT/ML injection  Inject 30 units daily before bed  10 mL  3   .  lisinopril (PRINIVIL,ZESTRIL) 5 MG tablet  Take 1 tablet (5 mg total) by mouth daily.  90 tablet  1   .  metFORMIN (GLUCOPHAGE) 1000 MG tablet  Take 1 tablet (1,000 mg total) by mouth 2 (two) times daily with a meal.  180 tablet  4   .  metoprolol succinate (TOPROL-XL) 25 MG 24 hr tablet  Take 25 mg by mouth daily.     .  nitroGLYCERIN (NITROSTAT) 0.4 MG SL tablet  0.4 mg. Place 1 tablet (0.4 mg total) under the tongue every 5 (five) minutes as needed for Chest pain.     .  pravastatin (PRAVACHOL) 40 MG tablet  Take 40 mg by mouth every evening.     .  traMADol (ULTRAM) 50 MG tablet  Take 1 tablet (50 mg total) by mouth every 6 (six) hours as needed for pain.  90 tablet  0    Results for orders placed during the hospital encounter of 01/27/14 (from the past 48 hour(s))   TROPONIN I Status: None    Collection Time    01/27/14 10:43 AM   Result  Value  Ref Range    Troponin I  <0.30  <0.30 ng/mL    Comment:      Due to the release kinetics of cTnI,     a negative result within the first hours     of the onset of symptoms does not rule out     myocardial infarction with certainty.  If myocardial infarction is still suspected,     repeat the test at appropriate intervals.   BASIC METABOLIC PANEL Status: Abnormal    Collection Time     01/27/14 10:43 AM   Result  Value  Ref Range    Sodium  141  137 - 147 mEq/L    Potassium  3.8  3.7 - 5.3 mEq/L    Chloride  105  96 - 112 mEq/L    CO2  20  19 - 32 mEq/L    Glucose, Bld  204 (*)  70 - 99 mg/dL    BUN  17  6 - 23 mg/dL    Creatinine, Ser  1.16  0.50 - 1.35 mg/dL    Calcium  9.2  8.4 - 10.5 mg/dL    GFR calc non Af Amer  65 (*)  >90 mL/min    GFR calc Af Amer  76 (*)  >90 mL/min    Comment:  (NOTE)     The eGFR has been calculated using the CKD EPI equation.     This calculation has not been validated in all clinical situations.     eGFR's persistently <90 mL/min signify possible Chronic Kidney     Disease.   CBC WITH DIFFERENTIAL Status: Abnormal    Collection Time    01/27/14 10:43 AM   Result  Value  Ref Range    WBC  3.3 (*)  4.0 - 10.5 K/uL    RBC  3.59 (*)  4.22 - 5.81 MIL/uL    Hemoglobin  10.0 (*)  13.0 - 17.0 g/dL    HCT  30.4 (*)  39.0 - 52.0 %    MCV  84.7  78.0 - 100.0 fL    MCH  27.9  26.0 - 34.0 pg    MCHC  32.9  30.0 - 36.0 g/dL    RDW  13.4  11.5 - 15.5 %    Platelets  135 (*)  150 - 400 K/uL    Neutrophils Relative %  59  43 - 77 %    Neutro Abs  1.9  1.7 - 7.7 K/uL    Lymphocytes Relative  26  12 - 46 %    Lymphs Abs  0.9  0.7 - 4.0 K/uL    Monocytes Relative  10  3 - 12 %    Monocytes Absolute  0.3  0.1 - 1.0 K/uL    Eosinophils Relative  4  0 - 5 %    Eosinophils Absolute  0.1  0.0 - 0.7 K/uL    Basophils Relative  1  0 - 1 %    Basophils Absolute  0.0  0.0 - 0.1 K/uL   PRO B NATRIURETIC PEPTIDE Status: Abnormal    Collection Time    01/27/14 10:43 AM   Result  Value  Ref Range    Pro B Natriuretic peptide (BNP)  227.4 (*)  0 - 125 pg/mL   URINE RAPID DRUG SCREEN (HOSP PERFORMED) Status: None    Collection Time    01/27/14 11:37 AM   Result  Value  Ref Range    Opiates  NONE DETECTED  NONE DETECTED    Cocaine  NONE DETECTED  NONE DETECTED    Benzodiazepines  NONE DETECTED  NONE DETECTED    Amphetamines  NONE DETECTED  NONE  DETECTED    Tetrahydrocannabinol  NONE DETECTED  NONE DETECTED    Barbiturates  NONE DETECTED  NONE DETECTED    Comment:  DRUG SCREEN FOR MEDICAL PURPOSES     ONLY. IF CONFIRMATION IS NEEDED     FOR ANY PURPOSE, NOTIFY LAB     WITHIN 5 DAYS.         LOWEST DETECTABLE LIMITS     FOR URINE DRUG SCREEN     Drug Class Cutoff (ng/mL)     Amphetamine 1000     Barbiturate 200     Benzodiazepine 947     Tricyclics 654     Opiates 300     Cocaine 300     THC 50   POC OCCULT BLOOD, ED Status: None    Collection Time    01/27/14 11:37 AM   Result  Value  Ref Range    Fecal Occult Bld  NEGATIVE  NEGATIVE   BLOOD GAS, ARTERIAL Status: Abnormal    Collection Time    01/27/14 4:08 PM   Result  Value  Ref Range    O2 Content  2.0     Delivery systems  NASAL CANNULA     pH, Arterial  7.405  7.350 - 7.450    pCO2 arterial  35.0  35.0 - 45.0 mmHg    pO2, Arterial  108.0 (*)  80.0 - 100.0 mmHg    Bicarbonate  21.5  20.0 - 24.0 mEq/L    TCO2  22.6  0 - 100 mmol/L    Acid-base deficit  2.5 (*)  0.0 - 2.0 mmol/L    O2 Saturation  98.3     Patient temperature  98.6     Collection site  LEFT RADIAL     Drawn by  [id number]     Sample type  ARTERIAL DRAW     Allens test (pass/fail)  PASS  PASS    Dg Chest 2 View  01/27/2014 CLINICAL DATA: Chest pain, shortness of breath EXAM: CHEST 2 VIEW COMPARISON: 01/07/2014 FINDINGS: There is no focal parenchymal opacity, pleural effusion, or pneumothorax. The heart and mediastinal contours are unremarkable. There is mild degenerative disc disease of the lumbar spine. IMPRESSION: No active cardiopulmonary disease. Electronically Signed By: Kathreen Devoid On: 01/27/2014 11:10   Cath Date and Site: 10/15/2013 at [facility] Coronary Dominance: Right  Vessel Segment Vessel Type PCI Pre Stenosis (%) Post Stenosis (%)  Description  Proximal RCA Native No 40   Mid RCA Native No 75   Right PDA Native No 95   1st RPL Native No 90    Proximal LAD Native Yes 98 0  Calcium  Mid LAD Native Yes 98 0  Calcium  2nd Marginal Native No 70  Review of Systems  Respiratory: Positive for cough and shortness of breath.  Cardiovascular: Positive for chest pain.  Gastrointestinal: Positive for nausea. Negative for vomiting.  All other systems reviewed and are negative.   Blood pressure 110/72, pulse 91, temperature 98 F (36.7 C), temperature source Oral, resp. rate 17, weight 185 lb (83.915 kg), SpO2 99.00%.  Physical Exam  Constitutional: He is oriented to person, place, and time. He appears well-developed and well-nourished. No distress.  Cardiovascular:  Pulses:  Radial pulses are 2+ on the right side, and 2+ on the left side.  Dorsalis pedis pulses are 2+ on the right side, and 2+ on the left side.  Respiratory: Effort normal. No respiratory distress. He has wheezes.  GI: Soft. Bowel sounds are normal. He exhibits no distension. There is no tenderness.  Musculoskeletal: He exhibits no edema.  Neurological: He is alert and  oriented to person, place, and time.  Skin: Skin is dry. He is not diaphoretic.  Psychiatric: He has a normal mood and affect. His behavior is normal.   Assessment/Plan  Active Problems:  HIV (human immunodeficiency virus infection)  Hyperlipidemia LDL goal < 100  Hypertension goal BP (blood pressure) < 140/80  3-vessel CAD  Chest pain    1. Chest Pain: h/o CAD with recent LAD stenting. EKG w/o ischemic changes. Initial troponin is negative. Symptoms are consistent with cardiac etiology, but may also be due to poorly treated asthma (exertional chest tightness, SOB and cough, as well as wheezing on physical exam). Pt states he only uses his rescue inhaler when needed and does not use his daily ICS. Suggest continuing w/u to rule out ACS and will need to educate patient on managing asthma to reduce exacerbations.  -cycle cardiac enzymes x 3  - If enzymes positive, start IV heparin  -make NPO at  midnight, in the event that he will need either a cath or NST  -check a P2Y12 to assess platelet inhibition, in the setting of Plavix therapy  -Continue ASA, Plavix, CCB and statin. Would avoid BB due to asthma and h/o cocaine use  -? Systolic function, consider getting a 2D echo  2. Hypertension: currently controlled. Continue Cardizem  3. HLD: continue statin   Lyda Jester  01/27/2014, 4:45 PM    I have seen and examined the patient along with Lyda Jester, PAC.  I have reviewed the chart, notes and new data.  I agree with PA's note.  Key new complaints: chest discomfort is reminiscent of previous coronary events, he has also had exertional discomfort over last several weeks Key examination changes: no arrhythmia or CHF, no wheezing Key new findings / data: ECG unchanged from baseline, enzymes normal  PLAN: History suggests coronary insufficiency, but it is a little too early for in-stent restenosis. Also, no high risk ECG/biochemical features. Plan Lexiscan Myoview in AM (check for active wheezing before Lexiscan). Proceed to cath lab for angio if enzymes increase overnight, if he has high risk ECG changes or if Myoview shows a sizeable reversible defect. Discussed critical importance of dual antiplatelet therapy with his drug eluting stents and he claims compliance. I told him in no uncertain terms that cocaine and CAD is frequently a highly lethal combination and that he is taking an enormous risk every time he uses. Recommended immediate and complete abstinence.  Sanda Klein, MD, [place] [phone] 01/27/2014, 5:32 PM

## 2014-01-27 NOTE — ED Provider Notes (Signed)
CSN: [id number]     Arrival date & time 01/27/14  1015 History   First MD Initiated Contact with Patient 01/27/14 1015     Chief Complaint  Patient presents with  . Chest Pain  . Cough      HPI Pt arrives via EMS from home with left sided chest pain and cough radiating to left arm since Sunday AM. Pt has been nauseated, lightheaded and SOB since pain began. Hx of stents. 20g LAC, 324mg ASA PTA. 3 nitro with pain going from 8/10 to 6/10 at present. CBG 220. Pt awake, alert, oriented x4.  Past Medical History  Diagnosis Date  . Diabetes mellitus     Diagnosed in 2002, started insulin in 2012  . Hypertension   . Headache(784.0)     CT head 08/2011: Periventricular and subcortical white matter hypodensities are most in keeping with chronic microangiopathic change  . Gout   . Hyperlipidemia   . HIV infection Nov 2012    Followed by Dr. Hatcher  . Crack cocaine use     for 20+ years, has been enrolled in detox programs in the past  . Chondromalacia of medial femoral condyle     Left knee MRI 04/28/12: Chondromalacia of the medial femoral condyle with slight peripheral degeneration of the meniscocapsular junction of the medial meniscus; followed by sports medicine  . Asthma     No PFTs, history of childhood asthma  . Depression     with history of hospitalization for suicidal ideation  . CAD (coronary artery disease) 06/10/2013    Presented as STEMI 06/10/13; s/p BMS to LAD; done at [facility] (LAD prox 95% with thrombus, mid 75%, D2 75%; LCX OM2 75%; RCA small, PDA 95%, PLV 95%)  . Gout 04/28/2012   Past Surgical History  Procedure Laterality Date  . Prostate surgery    . Bowel resection    . Back surgery      19 87   Family History  Problem Relation Age of Onset  . Diabetes Mother   . Diabetes Father   . Diabetes Brother   . Colon cancer Neg Hx    History  Substance Use Topics  . Smoking status: Never Smoker   . Smokeless tobacco: Never Used  . Alcohol Use: No    Review  of Systems    Allergies  Review of patient's allergies indicates no known allergies.  Home Medications   Prior to Admission medications   Medication Sig Start Date End Date Taking? Authorizing Provider  albuterol (PROAIR HFA) 108 (90 BASE) MCG/ACT inhaler Inhale 2 puffs into the lungs every 6 (six) hours as needed for wheezing or shortness of breath.   Yes Historical Provider, MD  allopurinol (ZYLOPRIM) 100 MG tablet Take 2 tablets (200 mg total) by mouth every morning. For gout. 09/15/13  Yes Neema Bobbie Stack, MD  amantadine (SYMMETREL) 100 MG capsule Take 100 mg by mouth daily.  09/22/12  Yes Historical Provider, MD  aspirin 81 MG chewable tablet Chew 81 mg by mouth daily. For blood thinner.   Yes Historical Provider, MD  clopidogrel (PLAVIX) 75 MG tablet Take 75 mg by mouth daily with breakfast.  06/13/13 06/13/14 Yes Historical Provider, MD  diclofenac sodium (VOLTAREN) 1 % GEL Apply 2 g topically 2 (two) times daily as needed (pain).   Yes Historical Provider, MD  diltiazem (CARDIZEM CD) 120 MG 24 hr capsule Take 120 mg by mouth daily. Take 1 capsule (120 mg total) by mouth daily. 06/13/13  Yes Historical Provider, MD  DULoxetine (CYMBALTA) 60 MG capsule Take 1 capsule (60 mg total) by mouth daily.   Yes Othella Boyer, MD  Emtricitab-Rilpivir-Tenofovir (COMPLERA) 200-25-300 MG TABS Take 1 tablet by mouth daily. 12/17/13  Yes [name], MD  gabapentin (NEURONTIN) 300 MG capsule Take 300 mg by mouth 3 (three) times daily.   Yes Historical Provider, MD  insulin glargine (LANTUS) 100 UNIT/ML injection Inject 30 units daily before bed 06/24/13 06/24/14 Yes Neema K Sharda, MD  lisinopril (PRINIVIL,ZESTRIL) 5 MG tablet Take 1 tablet (5 mg total) by mouth daily. 08/06/13 08/06/14 Yes Neema Bobbie Stack, MD  metFORMIN (GLUCOPHAGE) 1000 MG tablet Take 1 tablet (1,000 mg total) by mouth 2 (two) times daily with a meal. 05/12/13  Yes Neema Bobbie Stack, MD  metoprolol succinate (TOPROL-XL) 25 MG 24 hr tablet  Take 25 mg by mouth daily.   Yes Historical Provider, MD  nitroGLYCERIN (NITROSTAT) 0.4 MG SL tablet 0.4 mg. Place 1 tablet (0.4 mg total) under the tongue every 5 (five) minutes as needed for Chest pain. 06/13/13 06/13/14 Yes Historical Provider, MD  pravastatin (PRAVACHOL) 40 MG tablet Take 40 mg by mouth every evening.   Yes Historical Provider, MD  traMADol (ULTRAM) 50 MG tablet Take 1 tablet (50 mg total) by mouth every 6 (six) hours as needed for pain. 06/24/13  Yes Neema K Burnard Bunting, MD   BP 106/62  Pulse 74  Temp(Src) 98 F (36.7 C) (Oral)  Resp 15  Wt 185 lb (83.915 kg)  SpO2 100% Physical Exam  Nursing note and vitals reviewed. Constitutional: He is oriented to person, place, and time. He appears well-developed and well-nourished. No distress.  HENT:  Head: Normocephalic and atraumatic.  Eyes: Pupils are equal, round, and reactive to light.  Neck: Normal range of motion.  Cardiovascular: Normal rate and intact distal pulses.   Pulmonary/Chest: No respiratory distress. He has wheezes. He has no rales.  Abdominal: Normal appearance. He exhibits no distension.  Musculoskeletal: Normal range of motion.  Neurological: He is alert and oriented to person, place, and time. No cranial nerve deficit.  Skin: Skin is warm and dry. No rash noted.  Psychiatric: He has a normal mood and affect. His behavior is normal.    ED Course  Procedures (including critical care time) Labs Review Labs Reviewed  BASIC METABOLIC PANEL - Abnormal; Notable for the following:    Glucose, Bld 204 (*)    GFR calc non Af Amer 65 (*)    GFR calc Af Amer 76 (*)    All other components within normal limits  CBC WITH DIFFERENTIAL - Abnormal; Notable for the following:    WBC 3.3 (*)    RBC 3.59 (*)    Hemoglobin 10.0 (*)    HCT 30.4 (*)    Platelets 135 (*)    All other components within normal limits  PRO B NATRIURETIC PEPTIDE - Abnormal; Notable for the following:    Pro B Natriuretic peptide (BNP) 227.4  (*)    All other components within normal limits  TROPONIN I  URINE RAPID DRUG SCREEN (HOSP PERFORMED)  TROPONIN I  TROPONIN I  OCCULT BLOOD X 1 CARD TO LAB, STOOL  POC OCCULT BLOOD, ED    Imaging Review Dg Chest 2 View  01/27/2014   CLINICAL DATA:  Chest pain, shortness of breath  EXAM: CHEST  2 VIEW  COMPARISON:  01/07/2014  FINDINGS: There is no focal parenchymal opacity, pleural effusion, or pneumothorax. The  heart and mediastinal contours are unremarkable.  There is mild degenerative disc disease of the lumbar spine.  IMPRESSION: No active cardiopulmonary disease.   Electronically Signed   By: Kathreen Devoid   On: 01/27/2014 11:10     EKG Interpretation   Date/Time:  Thursday January 27 2014 10:24:41 EDT Ventricular Rate:  94 PR Interval:  147 QRS Duration: 71 QT Interval:  347 QTC Calculation: 434 R Axis:   55 Text Interpretation:  Sinus rhythm Abnormal R-wave progression, early  transition No significant change since last tracing Confirmed by Edyth Glomb   MD, Madisynn Plair ([id number]) on 01/27/2014 10:30:23 AM      MDM   Final diagnoses:  Chest pain        Dot Lanes, MD 01/27/14 1407

## 2014-01-27 NOTE — ED Notes (Signed)
Paged [facility] to [phone]

## 2014-01-27 NOTE — Telephone Encounter (Signed)
Pt currently hospitalized.  Not on Complera while in the hospital.  Pharmacy notified that the pt is in the Hospital.  Need to wait until the pt is discharged to determine which rx pt will be prescribed.

## 2014-01-27 NOTE — H&P (Signed)
Date: 01/27/2014               Patient Name:  Michael [name] MRN: [id number]  DOB: Mar 31, 1951 Age / Sex: 63 y.o., male   PCP: Othella Boyer, MD         Medical Service: Internal Medicine Teaching Service         Attending Physician: Dr. Dot Lanes, MD    First Contact: Dr. Ronnald Ramp Pager: [phone]  Second Contact: Dr. Eula Fried Pager: [phone]       After Hours (After 5p/  First Contact Pager: [phone]  weekends / holidays): Second Contact Pager: [phone]   Chief Complaint: Chest pain, SOB, cough  History of Present Illness: Michael [name] is a 63 y.o. male w/ PMHx of HTN, HLD, DM type II, HIV/AIDS (CD4 71, VL <20), CAD (s/p multiple stent placement), Asthma, Gout, and depression, presents to the ED w/ complaints of chest pain and SOB. The patient claims these symptoms started 3-4 days ago, mostly w/ chest pain, that he describes as sharp in nature, 8/10 in severity, left-sided, radiating into the left arm. He claims the cough has been very severe, dry in nature, and makes his SOB worse. He denies fever, chills, nausea, or vomiting. The patient has a h/o asthma, but states that it is rarely ever an issue for him, uses his rescue inhaler very infrequently.  The patient has an extensive h/o CAD, w/ multiple previous stents to his LAD, most recently 10/2013, where he had previous stents re-stented in the setting of in-stent stenosis. The patient claims since his most recent cardiac cath, he felt well for 2-4 weeks, and then began having chest pain w/ exertion and associated SOB. Prior to this admission, patient received ASA 325 + NTG x3 via EMS, in which he says his pain was decreased from 8/10-6/10. The patient denies any dizziness, lightheadedness, palpitations, LE swelling, PND, or orthopnea.   Meds: No current facility-administered medications for this encounter.   Current Outpatient Prescriptions  Medication Sig Dispense Refill  . albuterol (PROAIR HFA) 108 (90 BASE) MCG/ACT  inhaler Inhale 2 puffs into the lungs every 6 (six) hours as needed for wheezing or shortness of breath.      . allopurinol (ZYLOPRIM) 100 MG tablet Take 2 tablets (200 mg total) by mouth every morning. For gout.  60 tablet  3  . amantadine (SYMMETREL) 100 MG capsule Take 100 mg by mouth daily.       Marland Kitchen aspirin 81 MG chewable tablet Chew 81 mg by mouth daily. For blood thinner.      . clopidogrel (PLAVIX) 75 MG tablet Take 75 mg by mouth daily with breakfast.       . diclofenac sodium (VOLTAREN) 1 % GEL Apply 2 g topically 2 (two) times daily as needed (pain).      Marland Kitchen diltiazem (CARDIZEM CD) 120 MG 24 hr capsule Take 120 mg by mouth daily. Take 1 capsule (120 mg total) by mouth daily.      . DULoxetine (CYMBALTA) 60 MG capsule Take 1 capsule (60 mg total) by mouth daily.  30 capsule  1  . Emtricitab-Rilpivir-Tenofovir (COMPLERA) 200-25-300 MG TABS Take 1 tablet by mouth daily.  30 tablet  0  . gabapentin (NEURONTIN) 300 MG capsule Take 300 mg by mouth 3 (three) times daily.      . insulin glargine (LANTUS) 100 UNIT/ML injection Inject 30 units daily before bed  10 mL  3  . lisinopril (PRINIVIL,ZESTRIL) 5  MG tablet Take 1 tablet (5 mg total) by mouth daily.  90 tablet  1  . metFORMIN (GLUCOPHAGE) 1000 MG tablet Take 1 tablet (1,000 mg total) by mouth 2 (two) times daily with a meal.  180 tablet  4  . metoprolol succinate (TOPROL-XL) 25 MG 24 hr tablet Take 25 mg by mouth daily.      . nitroGLYCERIN (NITROSTAT) 0.4 MG SL tablet 0.4 mg. Place 1 tablet (0.4 mg total) under the tongue every 5 (five) minutes as needed for Chest pain.      . pravastatin (PRAVACHOL) 40 MG tablet Take 40 mg by mouth every evening.      . traMADol (ULTRAM) 50 MG tablet Take 1 tablet (50 mg total) by mouth every 6 (six) hours as needed for pain.  90 tablet  0    Allergies: Allergies as of 01/27/2014  . (No Known Allergies)   Past Medical History  Diagnosis Date  . Diabetes mellitus     Diagnosed in 2002, started insulin  in 2012  . Hypertension   . Headache(784.0)     CT head 08/2011: Periventricular and subcortical white matter hypodensities are most in keeping with chronic microangiopathic change  . Gout   . Hyperlipidemia   . HIV infection Nov 2012    Followed by [name]  . Crack cocaine use     for 20+ years, has been enrolled in detox programs in the past  . Chondromalacia of medial femoral condyle     Left knee MRI 04/28/12: Chondromalacia of the medial femoral condyle with slight peripheral degeneration of the meniscocapsular junction of the medial meniscus; followed by sports medicine  . Asthma     No PFTs, history of childhood asthma  . Depression     with history of hospitalization for suicidal ideation  . CAD (coronary artery disease) 06/10/2013    Presented as STEMI 06/10/13; s/p BMS to LAD; done at [facility] (LAD prox 95% with thrombus, mid 75%, D2 75%; LCX OM2 75%; RCA small, PDA 95%, PLV 95%)  . Gout 04/28/2012   Past Surgical History  Procedure Laterality Date  . Prostate surgery    . Bowel resection    . Back surgery      1988   Family History  Problem Relation Age of Onset  . Diabetes Mother   . Diabetes Father   . Diabetes Brother   . Colon cancer Neg Hx    History   Social History  . Marital Status: Single    Spouse Name: N/A    Number of Children: N/A  . Years of Education: N/A   Occupational History  . Not on file.   Social History Main Topics  . Smoking status: Never Smoker   . Smokeless tobacco: Never Used  . Alcohol Use: No  . Drug Use: No     Comment: last use 04/06/13  . Sexual Activity: No     Comment: accepted condoms   Other Topics Concern  . Not on file   Social History Narrative  . No narrative on file    Review of Systems: General: Positive for fatigue. Denies fever, chills, diaphoresis, appetite change.  Respiratory: Positive for SOB, DOE, cough, chest tightness and wheezing. Cardiovascular: Positive for chest pain. Denies palpitations.    Gastrointestinal: Denies nausea, vomiting, abdominal pain, diarrhea, constipation, blood in stool and abdominal distention.  Genitourinary: Denies dysuria, urgency, frequency, hematuria, and flank pain. Endocrine: Denies hot or cold intolerance, polyuria, and polydipsia. Musculoskeletal:  Positive for chronic back pain. Denies myalgias, joint swelling, arthralgias and gait problem.  Skin: Denies pallor, rash and wounds.  Neurological: Positive for weakness. Denies dizziness, seizures, syncope, lightheadedness, numbness and headaches.  Psychiatric/Behavioral: Denies mood changes, confusion, nervousness, sleep disturbance and agitation.  Physical Exam: Filed Vitals:   01/27/14 1145 01/27/14 1230 01/27/14 1242 01/27/14 1315  BP: 105/63 108/61 108/61 106/62  Pulse: 85 74 91 74  Temp:   98 F (36.7 C)   TempSrc:   Oral   Resp: 17 12 18 15   Weight:      SpO2: 100% 100% 100% 100%  General: Vital signs reviewed.  Patient is a well-developed and well-nourished, in no acute distress and cooperative with exam.  Head: Normocephalic and atraumatic. Eyes: PERRL, EOMI, conjunctivae normal, No scleral icterus.  Neck: Supple, trachea midline, normal ROM, No JVD, masses, thyromegaly, or carotid bruit present.  Cardiovascular: RRR, S1 normal, S2 normal, no murmurs, gallops, or rubs. No tenderness to palpation.  Pulmonary/Chest: Slightly tachypneic. Air entry equal bilaterally, no rales, or rhonchi. Wheezes present diffusely. Patient in complete sentences.  Abdominal: Soft, non-tender, non-distended, BS +, no masses, organomegaly, or guarding present.  Musculoskeletal: No joint deformities, erythema, or stiffness, ROM full and nontender. Extremities: No swelling or edema,  pulses symmetric and intact bilaterally. No cyanosis or clubbing. Neurological: A&O x3, Strength is normal and symmetric bilaterally, cranial nerve II-XII are grossly intact, no focal motor deficit, sensory intact to light touch  bilaterally.  Skin: Warm, dry and intact. No rashes or erythema. Psychiatric: Normal mood and affect. Speech and behavior is normal. Cognition and memory are normal.   Lab results: Basic Metabolic Panel:  Recent Labs  01/27/14 1043  NA 141  K 3.8  CL 105  CO2 20  GLUCOSE 204*  BUN 17  CREATININE 1.16  CALCIUM 9.2   CBC:  Recent Labs  01/27/14 1043  WBC 3.3*  NEUTROABS 1.9  HGB 10.0*  HCT 30.4*  MCV 84.7  PLT 135*   Cardiac Enzymes:  Recent Labs  01/27/14 1043  TROPONINI <0.30   BNP:  Recent Labs  01/27/14 1043  PROBNP 227.4*   Urine Drug Screen: Drugs of Abuse     Component Value Date/Time   LABOPIA NONE DETECTED 01/27/2014 1137   COCAINSCRNUR NONE DETECTED 01/27/2014 1137   LABBENZ NONE DETECTED 01/27/2014 1137   AMPHETMU NONE DETECTED 01/27/2014 1137   THCU NONE DETECTED 01/27/2014 1137   LABBARB NONE DETECTED 01/27/2014 1137    Imaging results:  Dg Chest 2 View  01/27/2014   CLINICAL DATA:  Chest pain, shortness of breath  EXAM: CHEST  2 VIEW  COMPARISON:  01/07/2014  FINDINGS: There is no focal parenchymal opacity, pleural effusion, or pneumothorax. The heart and mediastinal contours are unremarkable.  There is mild degenerative disc disease of the lumbar spine.  IMPRESSION: No active cardiopulmonary disease.   Electronically Signed   By: Kathreen Devoid   On: 01/27/2014 11:10    Other results: EKG: NSR @ 94 bpm. No dynamic ST/T wave changes.   Assessment & Plan by Problem: Michael [name] is a 63 y.o. male w/ PMHx of HTN, HLD, DM type II, HIV/AIDS (CD4 36, VL <20), CAD (s/p multiple stent placement), Asthma, Gout, and depression, admitted for mild to moderate asthma exacerbation.   Asthma Exacerbation- Patient complains of chest pain and tightness, SOB, cough and wheezing for the past 3-4 days. Patient claims he has a h/o Asthma, but has not had issues w/  this recently. Uses an Albuterol rescue inhaler at home. Rarely ever has issues w/ his  breathing. On admission, patient noted to have significant wheezing diffusely, but w/ adequate air entry. Able to speak in short sentences, SpO2 100% on room air. CXR findings show NACPD. ABG performed, pH 7.41, pCO2 35, pO2 108, HCO3 22, SpO2 98. Patient slightly tachycardic on exam. Given clinical presentation, significant for mild asthma exacerbation.  -Duonebs q2h scheduled -Solu-Medrol 80 mg IV bid -Magnesium Sulfate 2g IV  -O2 via [place] (2L) -Robitussin-DM prn for cough -Morphine 2 mg q4h prn for pain -Peak flow monitoring pre- and post- nebs. -Zofran prn nausea  CAD- Reports chest pain w/ exertion for the past 2-3 months. Patient w/ multiple previous stents. Per records, he was hospitalized at [facility] in 06/2013 where he presented with chest pain after multiple days of drug use and had an anterior STEMI. Received a BMS to his proximal and distal LAD at that time. In 10/2013, had a repeat catheterization, found to have 98% in-stent stenoses of proximal and mid LAD stents. The previously placed BMS with ISR were successfully treated with PCI x2. Placed on dual anti-platelet therapy with ASA + Plavix at that time. UDS negative during this admission. -Troponin x1 negative; continue to cycle -EKG in AM -ASA 81 + Plavix 75 mg po qd -Cardiology consulted given extensive h/o of CAD -Morphine as above -Lipitor 10 mg qhs -Continue Cardizem CD 120 mg po qd -HOLD Toprol XL 25 mg po qd  HIV/AIDS- Most recent CD4 380, VL <20, as of 10/2013. No issues currently. -Continue Truvada + Edurant  DM type II- Most recent HbA1c 6.9 as of 06/2013. On Lantus 30 units at home + Metformin. -Lantus 20 units -Carb-modified diet -ISS-S + CBG's AC/HS  Depression- No issues. -Continue Cymbalta 60 mg po qd  Gout- No active gout flare. -Allopurinol 200 mg po qd  DVT/PE PPx- Lovenox [place]  Dispo: Disposition is deferred at this time, awaiting improvement of current medical problems. Anticipated discharge in  approximately 2-3 day(s).   The patient does have a current PCP (Neema Bobbie Stack, MD) and does need an [facility] hospital follow-up appointment after discharge.  The patient does not have transportation limitations that hinder transportation to clinic appointments.  Signed: Corky Sox, MD 01/27/2014, 1:57 PM

## 2014-01-27 NOTE — ED Notes (Signed)
Pt arrives via EMS from home with left sided chest pain and cough radiating to left arm since Sunday AM. Pt has been nauseated, lightheaded and SOB since pain began. Hx of stents. 20g LAC, 324mg  ASA PTA. 3 nitro with pain going from 8/10 to 6/10 at present. CBG 220. Pt awake, alert, oriented x4.

## 2014-01-28 ENCOUNTER — Inpatient Hospital Stay (HOSPITAL_COMMUNITY): Payer: Medicare Other

## 2014-01-28 DIAGNOSIS — B20 Human immunodeficiency virus [HIV] disease: Secondary | ICD-10-CM

## 2014-01-28 DIAGNOSIS — E119 Type 2 diabetes mellitus without complications: Secondary | ICD-10-CM

## 2014-01-28 DIAGNOSIS — I2510 Atherosclerotic heart disease of native coronary artery without angina pectoris: Secondary | ICD-10-CM

## 2014-01-28 DIAGNOSIS — F329 Major depressive disorder, single episode, unspecified: Secondary | ICD-10-CM

## 2014-01-28 DIAGNOSIS — F3289 Other specified depressive episodes: Secondary | ICD-10-CM

## 2014-01-28 DIAGNOSIS — J45901 Unspecified asthma with (acute) exacerbation: Principal | ICD-10-CM

## 2014-01-28 DIAGNOSIS — R079 Chest pain, unspecified: Secondary | ICD-10-CM

## 2014-01-28 LAB — URINE MICROSCOPIC-ADD ON

## 2014-01-28 LAB — URINALYSIS, ROUTINE W REFLEX MICROSCOPIC
BILIRUBIN URINE: NEGATIVE
GLUCOSE, UA: 500 mg/dL — AB
KETONES UR: 15 mg/dL — AB
Leukocytes, UA: NEGATIVE
Nitrite: NEGATIVE
PH: 5.5 (ref 5.0–8.0)
PROTEIN: 30 mg/dL — AB
Specific Gravity, Urine: 1.022 (ref 1.005–1.030)
Urobilinogen, UA: 1 mg/dL (ref 0.0–1.0)

## 2014-01-28 LAB — CBC
HCT: 29.2 % — ABNORMAL LOW (ref 39.0–52.0)
Hemoglobin: 9.8 g/dL — ABNORMAL LOW (ref 13.0–17.0)
MCH: 27.8 pg (ref 26.0–34.0)
MCHC: 33.6 g/dL (ref 30.0–36.0)
MCV: 82.7 fL (ref 78.0–100.0)
PLATELETS: 140 10*3/uL — AB (ref 150–400)
RBC: 3.53 MIL/uL — AB (ref 4.22–5.81)
RDW: 13.2 % (ref 11.5–15.5)
WBC: 3.8 10*3/uL — ABNORMAL LOW (ref 4.0–10.5)

## 2014-01-28 LAB — BASIC METABOLIC PANEL
BUN: 20 mg/dL (ref 6–23)
CALCIUM: 9.2 mg/dL (ref 8.4–10.5)
CO2: 18 mEq/L — ABNORMAL LOW (ref 19–32)
CREATININE: 1.21 mg/dL (ref 0.50–1.35)
Chloride: 101 mEq/L (ref 96–112)
GFR, EST AFRICAN AMERICAN: 72 mL/min — AB (ref >90)
GFR, EST NON AFRICAN AMERICAN: 62 mL/min — AB (ref >90)
Glucose, Bld: 390 mg/dL — ABNORMAL HIGH (ref 70–99)
Potassium: 5.2 mEq/L (ref 3.7–5.3)
Sodium: 136 mEq/L — ABNORMAL LOW (ref 137–147)

## 2014-01-28 LAB — GLUCOSE, CAPILLARY
GLUCOSE-CAPILLARY: 277 mg/dL — AB (ref 70–99)
GLUCOSE-CAPILLARY: 208 mg/dL — AB (ref 70–99)
Glucose-Capillary: 236 mg/dL — ABNORMAL HIGH (ref 70–99)

## 2014-01-28 LAB — LACTIC ACID, PLASMA: Lactic Acid, Venous: 1.5 mmol/L (ref 0.5–2.2)

## 2014-01-28 LAB — TROPONIN I: Troponin I: <0.3 ng/mL (ref <0.30)

## 2014-01-28 LAB — PLATELET INHIBITION P2Y12: Platelet Function  P2Y12: 339 [PRU] (ref 194–418)

## 2014-01-28 MED ORDER — HEPATITIS B VAC RECOMBINANT 10 MCG/ML IJ SUSP
1.0000 mL | Freq: Once | INTRAMUSCULAR | Status: AC
  Administered 2014-01-28: 10 ug via INTRAMUSCULAR
  Filled 2014-01-28: qty 1

## 2014-01-28 MED ORDER — HEPATITIS B VAC RECOMBINANT 10 MCG/0.5ML IJ SUSP: 0.5000 mL | Freq: Once | INTRAMUSCULAR | Status: DC

## 2014-01-28 MED ORDER — IPRATROPIUM-ALBUTEROL 0.5-2.5 (3) MG/3ML IN SOLN: 3.0000 mL | RESPIRATORY_TRACT | Status: DC | PRN

## 2014-01-28 MED ORDER — PREDNISONE 20 MG PO TABS
40.0000 mg | ORAL_TABLET | Freq: Every day | ORAL | Status: DC
  Administered 2014-01-29 – 2014-01-31 (×3): 40 mg via ORAL
  Filled 2014-01-28 (×5): qty 2

## 2014-01-28 MED ORDER — REGADENOSON 0.4 MG/5ML IV SOLN
INTRAVENOUS | Status: AC
  Administered 2014-01-28: 0.4 mg
  Filled 2014-01-28: qty 5

## 2014-01-28 MED ORDER — HYDROCODONE-HOMATROPINE 5-1.5 MG/5ML PO SYRP: 5.0000 mL | ORAL_SOLUTION | Freq: Four times a day (QID) | ORAL | Status: DC | PRN

## 2014-01-28 MED ORDER — INSULIN GLARGINE 100 UNIT/ML ~~LOC~~ SOLN
10.0000 [IU] | Freq: Once | SUBCUTANEOUS | Status: AC
  Administered 2014-01-28: 10 [IU] via SUBCUTANEOUS
  Filled 2014-01-28: qty 0.1

## 2014-01-28 MED ORDER — TECHNETIUM TC 99M SESTAMIBI GENERIC - CARDIOLITE
10.0000 | Freq: Once | INTRAVENOUS | Status: AC | PRN
  Administered 2014-01-28: 10 via INTRAVENOUS

## 2014-01-28 MED ORDER — INSULIN ASPART 100 UNIT/ML ~~LOC~~ SOLN
0.0000 [IU] | Freq: Three times a day (TID) | SUBCUTANEOUS | Status: DC
  Administered 2014-01-28: 5 [IU] via SUBCUTANEOUS
  Administered 2014-01-28: 8 [IU] via SUBCUTANEOUS
  Administered 2014-01-29: 11 [IU] via SUBCUTANEOUS
  Administered 2014-01-29: 3 [IU] via SUBCUTANEOUS

## 2014-01-28 MED ORDER — INSULIN GLARGINE 100 UNIT/ML ~~LOC~~ SOLN
30.0000 [IU] | Freq: Every day | SUBCUTANEOUS | Status: DC
  Administered 2014-01-29 – 2014-01-31 (×3): 30 [IU] via SUBCUTANEOUS
  Filled 2014-01-28 (×3): qty 0.3

## 2014-01-28 MED ORDER — TECHNETIUM TC 99M TETROFOSMIN IV KIT
30.0000 | PACK | Freq: Once | INTRAVENOUS | Status: AC | PRN
  Administered 2014-01-28: 30 via INTRAVENOUS

## 2014-01-28 MED ORDER — SODIUM CHLORIDE 0.9 % IV SOLN: INTRAVENOUS | Status: DC

## 2014-01-28 NOTE — H&P (Signed)
INTERNAL MEDICINE TEACHING SERVICE Attending Admission Note  Date: 01/28/2014  Patient name: Michael [name]  Medical record number: [id number]  Date of birth: 1951/08/14    I have seen and evaluated Michael [name] and discussed their care with the Residency Team.  63 yr old man with pmhx Type 2 DM, CAD with hx stent placement and instent restenosis, hx cocaine use, AIDS, HL, presented with SOB, CP, cough. The patient admitted to CP that was left sided and radiated to left arm. Cough has been dry in nature.  The patient states he had some improvement with NTG x3 given to him by EMS. On exam, he was noted to have have significant expiratory and inspiratory wheeze.  Cardiology evaluated him and planned for MPS if troponins negative. He has subsequently ruled out for ACS.  Due to findings of asthma exacerbation, he has received scheduled duoneb therapy, IV solumedrol, and IV magnesium with improvement. He is currently undergoing MPS.  At this time, agree with testing for P2Y12, as this pt may not have responded to Plavix therapy in past. Depending on findings of MPS, he may need to undergo repeat LHC.  Dominic Pea, DO, [facility] 01/28/2014, 11:24 AM

## 2014-01-28 NOTE — Progress Notes (Signed)
Subjective: Patient seen at bedside this AM. Breathing and chest pain improved according to the patient. Pulmonary exam without wheezes today. No fever, chills, nausea, or vomiting. Still appears somewhat tachypneic.   Objective: Vital signs in last 24 hours: Filed Vitals:   01/28/14 0200 01/28/14 0400 01/28/14 0745 01/28/14 0805  BP: 114/60 119/75  110/64  Pulse: 96 81  92  Temp:  97.5 F (36.4 C)  97.8 F (36.6 C)  TempSrc:  Oral  Oral  Resp: 21 18  19   Height:      Weight:      SpO2: 97% 98% 97% 96%   Weight change:   Intake/Output Summary (Last 24 hours) at 01/28/14 0827 Last data filed at 01/27/14 2100  Gross per 24 hour  Intake     50 ml  Output    600 ml  Net   -550 ml   Physical Exam: General: Alert, cooperative, NAD.  HEENT: PERRL, EOMI. Moist mucus membranes Neck: Full range of motion without pain, supple, no lymphadenopathy or carotid bruits Lungs: Clear to ascultation bilaterally, normal work of respiration, no wheezes, rales, rhonchi Heart: RRR, no murmurs, gallops, or rubs Abdomen: Soft, non-tender, non-distended, BS + Extremities: No cyanosis, clubbing, or edema Neurologic: Alert & oriented X3, cranial nerves II-XII intact, strength grossly intact, sensation intact to light touch  Lab Results: Basic Metabolic Panel:  Recent Labs Lab 01/27/14 1043 01/28/14 0317  NA 141 136*  K 3.8 5.2  CL 105 101  CO2 20 18*  GLUCOSE 204* 390*  BUN 17 20  CREATININE 1.16 1.21  CALCIUM 9.2 9.2   CBC:  Recent Labs Lab 01/27/14 1043 01/28/14 0317  WBC 3.3* 3.8*  NEUTROABS 1.9  --   HGB 10.0* 9.8*  HCT 30.4* 29.2*  MCV 84.7 82.7  PLT 135* 140*   Cardiac Enzymes:  Recent Labs Lab 01/27/14 1043 01/27/14 1710 01/27/14 2302  TROPONINI <0.30 <0.30 <0.30   BNP:  Recent Labs Lab 01/27/14 1043  PROBNP 227.4*   CBG:  Recent Labs Lab 01/27/14 1709 01/27/14 2157  GLUCAP 140* 330*   Urine Drug Screen: Drugs of Abuse     Component Value  Date/Time   LABOPIA NONE DETECTED 01/27/2014 1137   COCAINSCRNUR NONE DETECTED 01/27/2014 1137   LABBENZ NONE DETECTED 01/27/2014 1137   AMPHETMU NONE DETECTED 01/27/2014 1137   THCU NONE DETECTED 01/27/2014 1137   LABBARB NONE DETECTED 01/27/2014 1137     Micro Results: Recent Results (from the past 240 hour(s))  MRSA PCR SCREENING     Status: None   Collection Time    01/27/14  3:37 PM      Result Value Ref Range Status   MRSA by PCR NEGATIVE  NEGATIVE Final   Comment:            The GeneXpert MRSA Assay (FDA     approved for NASAL specimens     only), is one component of a     comprehensive MRSA colonization     surveillance program. It is not     intended to diagnose MRSA     infection nor to guide or     monitor treatment for     MRSA infections.   Studies/Results: Dg Chest 2 View  01/27/2014   CLINICAL DATA:  Chest pain, shortness of breath  EXAM: CHEST  2 VIEW  COMPARISON:  01/07/2014  FINDINGS: There is no focal parenchymal opacity, pleural effusion, or pneumothorax. The heart and mediastinal contours are  unremarkable.  There is mild degenerative disc disease of the lumbar spine.  IMPRESSION: No active cardiopulmonary disease.   Electronically Signed   By: Kathreen Devoid   On: 01/27/2014 11:10   Medications: I have reviewed the patient's current medications. Scheduled Meds: . allopurinol  200 mg Oral q morning - 10a  . aspirin  81 mg Oral Daily  . atorvastatin  10 mg Oral q1800  . clopidogrel  75 mg Oral Q breakfast  . diltiazem  120 mg Oral Daily  . DULoxetine  60 mg Oral Daily  . emtricitabine-tenofovir  1 tablet Oral Daily  . enoxaparin (LOVENOX) injection  40 mg Subcutaneous Q24H  . gabapentin  300 mg Oral TID  . guaiFENesin-dextromethorphan  5 mL Oral Q4H  . insulin aspart  0-15 Units Subcutaneous TID WC  . insulin glargine  20 Units Subcutaneous QHS  . ipratropium-albuterol  3 mL Nebulization Q4H  . methylPREDNISolone (SOLU-MEDROL) injection  80 mg Intravenous  Daily  . pneumococcal 23 valent vaccine  0.5 mL Intramuscular Tomorrow-1000  . regadenoson  0.4 mg Intravenous Once  . rilpivirine  25 mg Oral Q breakfast  . sodium chloride  3 mL Intravenous Q12H   Continuous Infusions: . sodium chloride     PRN Meds:.morphine injection, traMADol  Assessment/Plan: [name] is a 63 y.o. male w/ PMHx of HTN, HLD, DM type II, HIV/AIDS (CD4 380, VL <20), CAD (s/p multiple stent placement), Asthma, Gout, and depression, admitted for mild to moderate asthma exacerbation.   Asthma Exacerbation- Significantly improved today. No wheezes on exam. Tolerating room air, however, patient still appears somewhat tachypneic.  -Transfer to telemetry -Duonebs q4h scheduled  -Solu-Medrol 80 mg IV qd. Will change to Prednisone 40 mg po qd starting tomorrow. -O2 via [place] (2L) prn -Robitussin-DM prn for cough + Hycodan prn -Morphine 2 mg q4h prn for pain  -Peak flow monitoring pre- and post- nebs.  -Zofran prn nausea   CAD- Patient does admit to cocaine use as recently as the end of March. Troponins negative x3, no changes in EKG this AM. Patient went for Myoview today, interpreted as an intermediate risk study. Patient has evidence of a previous medium-sized distal anterior wall MI with a very small amount of peri-infarct ischemia. His overall left systolic function is well-preserved. The distal anterior wall and the apex seem to contract fairly well. EF of 60%.  -Possible cath per cardiology -Continue ASA 81 + Plavix 75 mg po qd  -Morphine prn pain -Lipitor 10 mg qhs  -Continue Cardizem CD 120 mg po qd  -Continue to hold Toprol XL.   HIV/AIDS -Continue Truvada + Edurant   DM type II- CBG's slightly elevated overnight.  -Increase Lantus to 30 units (home dose); patient now eating -ISS-S + CBG's AC/HS  -Carb-modified diet   Depression -Continue Cymbalta 60 mg po qd   Gout  -Allopurinol 200 mg po qd   DVT/PE PPx- Lovenox   Dispo: Disposition is  deferred at this time, awaiting improvement of current medical problems.  Anticipated discharge in approximately 1-2 day(s).   The patient does have a current PCP (Neema Bobbie Stack, MD) and does need an [facility] hospital follow-up appointment after discharge.  The patient does not have transportation limitations that hinder transportation to clinic appointments.  .Services Needed at time of discharge: Y = Yes, Blank = No PT:   OT:   RN:   Equipment:   Other:     LOS: 1 day   [facility]  Bettey Costa, MD 01/28/2014, 8:27 AM

## 2014-01-28 NOTE — Telephone Encounter (Signed)
Please refill complera at d/c.

## 2014-01-28 NOTE — Progress Notes (Addendum)
Subjective: Some Cp he describes as "sharp/burning"  Objective: Vital signs in last 24 hours: Temp:  [97.5 F (36.4 C)-98.3 F (36.8 C)] 97.8 F (36.6 C) (04/24 0805) Pulse Rate:  [74-99] 94 (04/24 0927) Resp:  [12-27] 16 (04/24 0927) BP: (105-139)/(53-104) 105/53 mmHg (04/24 0927) SpO2:  [96 %-100 %] 98 % (04/24 0927) Weight:  [183 lb 10.3 oz (83.3 kg)-185 lb (83.915 kg)] 183 lb 10.3 oz (83.3 kg) (04/23 1445)    Intake/Output from previous day: 04/23 0701 - 04/24 0700 In: 64 [IV Piggyback:50] Out: 600 [Urine:600] Intake/Output this shift:    Medications Current Facility-Administered Medications  Medication Dose Route Frequency Provider Last Rate Last Dose  . 0.9 %  sodium chloride infusion   Intravenous Continuous Jerene Pitch, MD      . allopurinol (ZYLOPRIM) tablet 200 mg  200 mg Oral q morning - 10a Corky Sox, MD      . aspirin chewable tablet 81 mg  81 mg Oral Daily Corky Sox, MD      . atorvastatin (LIPITOR) tablet 10 mg  10 mg Oral q1800 Dominic Pea, DO   10 mg at 01/27/14 1800  . clopidogrel (PLAVIX) tablet 75 mg  75 mg Oral Q breakfast Corky Sox, MD      . diltiazem [facility] CD) 24 hr capsule 120 mg  120 mg Oral Daily Corky Sox, MD      . DULoxetine (CYMBALTA) DR capsule 60 mg  60 mg Oral Daily Corky Sox, MD      . emtricitabine-tenofovir (TRUVADA) 200-300 MG per tablet 1 tablet  1 tablet Oral Daily Dominic Pea, DO   1 tablet at 01/27/14 1700  . enoxaparin (LOVENOX) injection 40 mg  40 mg Subcutaneous Q24H Corky Sox, MD   40 mg at 01/27/14 1800  . gabapentin (NEURONTIN) capsule 300 mg  300 mg Oral TID Corky Sox, MD   300 mg at 01/27/14 2246  . guaiFENesin-dextromethorphan (ROBITUSSIN DM) 100-10 MG/5ML syrup 5 mL  5 mL Oral Q4H Jerene Pitch, MD   5 mL at 01/28/14 0443  . insulin aspart (novoLOG) injection 0-15 Units  0-15 Units Subcutaneous TID WC Corky Sox, MD      . insulin glargine (LANTUS) injection 20 Units  20 Units  Subcutaneous QHS Corky Sox, MD   20 Units at 01/27/14 2246  . ipratropium-albuterol (DUONEB) 0.5-2.5 (3) MG/3ML nebulizer solution 3 mL  3 mL Nebulization Q4H Dominic Pea, DO   3 mL at 01/28/14 0742  . methylPREDNISolone sodium succinate (SOLU-MEDROL) 125 mg/2 mL injection 80 mg  80 mg Intravenous Daily Corky Sox, MD   80 mg at 01/27/14 1645  . morphine 2 MG/ML injection 2 mg  2 mg Intravenous Q4H PRN Corky Sox, MD   2 mg at 01/27/14 1617  . pneumococcal 23 valent vaccine (PNU-IMMUNE) injection 0.5 mL  0.5 mL Intramuscular Tomorrow-1000 Dominic Pea, DO      . regadenoson (LEXISCAN) injection SOLN 0.4 mg  0.4 mg Intravenous Once Brittainy Simmons, PA-C      . rilpivirine (EDURANT) tablet 25 mg  25 mg Oral Q breakfast Dominic Pea, DO   25 mg at 01/27/14 1800  . sodium chloride 0.9 % injection 3 mL  3 mL Intravenous Q12H Corky Sox, MD   3 mL at 01/27/14 2249  . traMADol (ULTRAM) tablet 50 mg  50 mg Oral Q6H PRN Corky Sox, MD  PE: General appearance: alert, cooperative and no distress Lungs: clear to auscultation bilaterally Heart: regular rate and rhythm, S1, S2 normal, no murmur, click, rub or gallop Extremities: No LEE Pulses: 2+ and symmetric Skin: Warm and dry Neurologic: Grossly normal  Lab Results:   Recent Labs  01/27/14 1043 01/28/14 0317  WBC 3.3* 3.8*  HGB 10.0* 9.8*  HCT 30.4* 29.2*  PLT 135* 140*   BMET  Recent Labs  01/27/14 1043 01/28/14 0317  NA 141 136*  K 3.8 5.2  CL 105 101  CO2 20 18*  GLUCOSE 204* 390*  BUN 17 20  CREATININE 1.16 1.21  CALCIUM 9.2 9.2    Assessment/Plan  63 y/o male with h/o CAD, cocaine use, HIV, HTN, HLD, DM and asthma who was admitted today by Internal Medicine for chest pain. Per records, he was hospitalized at [facility] 06/10/13-06/12/13, in which he presented with chest pain after multiple days of drug use and had an anterior STEMI. Given ASA 324 on arrival and taken emergently to cath lab where a he  received a BMS to his proximal and distal LAD. In Jan of this year, he required repeat catheterization, also performed at [facility]. He was found to have 98% in-stent stenoses of proximal and mid LAD bare metal stents. Borderline OM2 lesion and diffuse mid and distal RCA lesions unchanged from last cath. The previously placed BMS with ISR were successfully treated with PCI utilizing 2 Promus Premier stents. He was placed on dual anti-platelet therapy with ASA + Plavix. Cardizem started in place of beta-blocker, given drug use. He was continued on home pravstatin. ACE-I was held given no evidence of systolic dysfunction.   He states that he had been doing fairly well, up until the last 2 months. He notes intermittent substernal chest pain/tightness radiating to the left arm. It occurs at rest but is worse with exertion. He notes associated DOE. Occasional LEE. No orthopnea or PND. He also notes a non productive cough x 4 days. His symptoms have progressively worsened, particularly in the last 4 days. He has had some relief with SL NTG  Principal Problem:   Acute asthma exacerbation Active Problems:   HIV (human immunodeficiency virus infection)   Uncontrolled type 2 diabetes with neuropathy   Cocaine abuse   Hyperlipidemia LDL goal < 100   Hypertension goal BP (blood pressure) < 140/80   3-vessel CAD   Chest pain  Plan:  Patient presents for lexiscan myovue.  He has ruled out for MI thus far.  UDS negative.  BP/HR stable.    Cp sounds GI/MS.  Worse with a deep breath.      LOS: 1 day    Tarri Fuller PA-C 01/28/2014 9:38 AM   Personally seen and examined, agree with above. Await pharmacologic stress test results. Previous significant coronary artery disease.  I would recommend transfer to telemetry bed. Candee Furbish, MD

## 2014-01-28 NOTE — Care Management Note (Signed)
    Page 1 of 1   01/28/2014     8:41:11 AM CARE MANAGEMENT NOTE 01/28/2014  Patient:  NEALE, STAVROS   Account Number:  [id number]  Date Initiated:  01/28/2014  Documentation initiated by:  Elissa Hefty  Subjective/Objective Assessment:   adm w asthma and ch pain     Action/Plan:   chart states from grp home, pcp dr Fransisca Kaufmann   Anticipated DC Date:     Anticipated DC Plan:    In-house referral  Clinical Social Worker      DC Planning Services  CM consult      Choice offered to / List presented to:             Status of service:   Medicare Important Message given?   (If response is "NO", the following Medicare IM given date fields will be blank) Date Medicare IM given:   Date Additional Medicare IM given:    Discharge Disposition:    Per UR Regulation:  Reviewed for med. necessity/level of care/duration of stay  If discussed at [place] of Stay Meetings, dates discussed:    Comments:

## 2014-01-28 NOTE — Progress Notes (Addendum)
[facility] [facility] for Infectious Disease  Date of Admission:  01/27/2014  Date of Consult:  01/28/2014  Reason for Consult: HIV+, Chest pain Referring Physician: Junie [name]  Impression/Recommendation Chest Pain HIV+ Cocaine Use  Plan- Await lexiscan Continue his ART Will check his HIV RNA and CD4.  His lipids were well controlled in January.  Start Hepatitis B vaccine series Emphasize importance of detox  My great appreciation to Dr Paya/IMTS and Dr Sallyanne Kuster   Thank you so much for this interesting consult,   [name] (pager) [phone] [email or web address]  Michael [name] is an 63 y.o. male.  HPI: 63 yo M with hx of DM2, HIV+ (previously on Complera), recent re-stent to LAD January 2015 after STEMI (06-2013). Admitted now with 2 months of DOE and chest pain. He describes the pain to me as related to deep breathing. In hospital he has had troponins (-). His breathing is also felt to have a component of astham exacerbation.  He denies missed HIV meds.   Past Medical History  Diagnosis Date  . Diabetes mellitus     Diagnosed in 2002, started insulin in 2012  . Hypertension   . Headache(784.0)     CT head 08/2011: Periventricular and subcortical white matter hypodensities are most in keeping with chronic microangiopathic change  . Gout   . Hyperlipidemia   . HIV infection Nov 2012    Followed by Dr. Johnnye [name]  . Crack cocaine use     for 20+ years, has been enrolled in detox programs in the past  . Chondromalacia of medial femoral condyle     Left knee MRI 04/28/12: Chondromalacia of the medial femoral condyle with slight peripheral degeneration of the meniscocapsular junction of the medial meniscus; followed by sports medicine  . Asthma     No PFTs, history of childhood asthma  . Depression     with history of hospitalization for suicidal ideation  . CAD (coronary artery disease) 06/10/2013    Presented as STEMI 06/10/13; s/p BMS to LAD; done at [facility]  (LAD prox 95% with thrombus, mid 75%, D2 75%; LCX OM2 75%; RCA small, PDA 95%, PLV 95%)  . Gout 04/28/2012    Past Surgical History  Procedure Laterality Date  . Prostate surgery    . Bowel resection    . Back surgery      1988     No Known Allergies  Medications:  Scheduled: . allopurinol  200 mg Oral q morning - 10a  . aspirin  81 mg Oral Daily  . atorvastatin  10 mg Oral q1800  . clopidogrel  75 mg Oral Q breakfast  . diltiazem  120 mg Oral Daily  . DULoxetine  60 mg Oral Daily  . emtricitabine-tenofovir  1 tablet Oral Daily  . enoxaparin (LOVENOX) injection  40 mg Subcutaneous Q24H  . gabapentin  300 mg Oral TID  . guaiFENesin-dextromethorphan  5 mL Oral Q4H  . insulin aspart  0-15 Units Subcutaneous TID WC  . insulin glargine  20 Units Subcutaneous QHS  . ipratropium-albuterol  3 mL Nebulization Q4H  . methylPREDNISolone (SOLU-MEDROL) injection  80 mg Intravenous Daily  . pneumococcal 23 valent vaccine  0.5 mL Intramuscular Tomorrow-1000  . regadenoson  0.4 mg Intravenous Once  . rilpivirine  25 mg Oral Q breakfast  . sodium chloride  3 mL Intravenous Q12H    Abtx:  Anti-infectives   Start     Dose/Rate Route Frequency Ordered Stop   01/27/14 1700  rilpivirine (EDURANT) tablet 25 mg     25 mg Oral Daily with breakfast 01/27/14 1548     01/27/14 1700  emtricitabine-tenofovir (TRUVADA) 200-300 MG per tablet 1 tablet     1 tablet Oral Daily 01/27/14 1548     01/27/14 1545  Emtricitab-Rilpivir-Tenofovir 200-25-300 MG TABS 1 tablet  Status:  Discontinued     1 tablet Oral Daily 01/27/14 1539 01/27/14 1547      Total days of antibiotics 0          Social History:  reports that he has never smoked. He has never used smokeless tobacco. He reports that he does not drink alcohol or use illicit drugs.  Family History  Problem Relation Age of Onset  . Diabetes Mother   . Diabetes Father   . Diabetes Brother   . Colon cancer Neg Hx     General ROS: CP worse with  deep breathing, see HPI.   Blood pressure 101/62, pulse 96, temperature 97.8 F (36.6 C), temperature source Oral, resp. rate 19, height 5' 7.5" (1.715 m), weight 83.3 kg (183 lb 10.3 oz), SpO2 97.00%. Eyes: negative findings: pupils equal, round, reactive to light and accomodation Throat: normal findings: oropharynx pink & moist without lesions or evidence of thrush Neck: no adenopathy and supple, symmetrical, trachea midline Lungs: clear to auscultation bilaterally Heart: regular rate and rhythm Abdomen: normal findings: bowel sounds normal and soft, non-tender Extremities: edema none   Results for orders placed during the hospital encounter of 01/27/14 (from the past 48 hour(s))  TROPONIN I     Status: None   Collection Time    01/27/14 10:43 AM      Result Value Ref Range   Troponin I <0.30  <0.30 ng/mL   Comment:            Due to the release kinetics of cTnI,     a negative result within the first hours     of the onset of symptoms does not rule out     myocardial infarction with certainty.     If myocardial infarction is still suspected,     repeat the test at appropriate intervals.  BASIC METABOLIC PANEL     Status: Abnormal   Collection Time    01/27/14 10:43 AM      Result Value Ref Range   Sodium 141  137 - 147 mEq/L   Potassium 3.8  3.7 - 5.3 mEq/L   Chloride 105  96 - 112 mEq/L   CO2 20  19 - 32 mEq/L   Glucose, Bld 204 (*) 70 - 99 mg/dL   BUN 17  6 - 23 mg/dL   Creatinine, Ser 1.16  0.50 - 1.35 mg/dL   Calcium 9.2  8.4 - 10.5 mg/dL   GFR calc non Af Amer 65 (*) >90 mL/min   GFR calc Af Amer 76 (*) >90 mL/min   Comment: (NOTE)     The eGFR has been calculated using the CKD EPI equation.     This calculation has not been validated in all clinical situations.     eGFR's persistently <90 mL/min signify possible Chronic Kidney     Disease.  CBC WITH DIFFERENTIAL     Status: Abnormal   Collection Time    01/27/14 10:43 AM      Result Value Ref Range   WBC 3.3  (*) 4.0 - 10.5 K/uL   RBC 3.59 (*) 4.22 - 5.81 MIL/uL   Hemoglobin 10.0 (*) 13.0 -  17.0 g/dL   HCT 30.4 (*) 39.0 - 52.0 %   MCV 84.7  78.0 - 100.0 fL   MCH 27.9  26.0 - 34.0 pg   MCHC 32.9  30.0 - 36.0 g/dL   RDW 13.4  11.5 - 15.5 %   Platelets 135 (*) 150 - 400 K/uL   Neutrophils Relative % 59  43 - 77 %   Neutro Abs 1.9  1.7 - 7.7 K/uL   Lymphocytes Relative 26  12 - 46 %   Lymphs Abs 0.9  0.7 - 4.0 K/uL   Monocytes Relative 10  3 - 12 %   Monocytes Absolute 0.3  0.1 - 1.0 K/uL   Eosinophils Relative 4  0 - 5 %   Eosinophils Absolute 0.1  0.0 - 0.7 K/uL   Basophils Relative 1  0 - 1 %   Basophils Absolute 0.0  0.0 - 0.1 K/uL  PRO B NATRIURETIC PEPTIDE     Status: Abnormal   Collection Time    01/27/14 10:43 AM      Result Value Ref Range   Pro B Natriuretic peptide (BNP) 227.4 (*) 0 - 125 pg/mL  URINE RAPID DRUG SCREEN (HOSP PERFORMED)     Status: None   Collection Time    01/27/14 11:37 AM      Result Value Ref Range   Opiates NONE DETECTED  NONE DETECTED   Cocaine NONE DETECTED  NONE DETECTED   Benzodiazepines NONE DETECTED  NONE DETECTED   Amphetamines NONE DETECTED  NONE DETECTED   Tetrahydrocannabinol NONE DETECTED  NONE DETECTED   Barbiturates NONE DETECTED  NONE DETECTED   Comment:            DRUG SCREEN FOR MEDICAL PURPOSES     ONLY.  IF CONFIRMATION IS NEEDED     FOR ANY PURPOSE, NOTIFY LAB     WITHIN 5 DAYS.                LOWEST DETECTABLE LIMITS     FOR URINE DRUG SCREEN     Drug Class       Cutoff (ng/mL)     Amphetamine      1000     Barbiturate      200     Benzodiazepine   211     Tricyclics       941     Opiates          300     Cocaine          300     THC              50  POC OCCULT BLOOD, ED     Status: None   Collection Time    01/27/14 11:37 AM      Result Value Ref Range   Fecal Occult Bld NEGATIVE  NEGATIVE  MRSA PCR SCREENING     Status: None   Collection Time    01/27/14  3:37 PM      Result Value Ref Range   MRSA by PCR NEGATIVE   NEGATIVE   Comment:            The GeneXpert MRSA Assay (FDA     approved for NASAL specimens     only), is one component of a     comprehensive MRSA colonization     surveillance program. It is not     intended to diagnose MRSA     infection nor  to guide or     monitor treatment for     MRSA infections.  BLOOD GAS, ARTERIAL     Status: Abnormal   Collection Time    01/27/14  4:08 PM      Result Value Ref Range   O2 Content 2.0     Delivery systems NASAL CANNULA     pH, Arterial 7.405  7.350 - 7.450   pCO2 arterial 35.0  35.0 - 45.0 mmHg   pO2, Arterial 108.0 (*) 80.0 - 100.0 mmHg   Bicarbonate 21.5  20.0 - 24.0 mEq/L   TCO2 22.6  0 - 100 mmol/L   Acid-base deficit 2.5 (*) 0.0 - 2.0 mmol/L   O2 Saturation 98.3     Patient temperature 98.6     Collection site LEFT RADIAL     Drawn by [phone]     Sample type ARTERIAL DRAW     Allens test (pass/fail) PASS  PASS  GLUCOSE, CAPILLARY     Status: Abnormal   Collection Time    01/27/14  5:09 PM      Result Value Ref Range   Glucose-Capillary 140 (*) 70 - 99 mg/dL  TROPONIN I     Status: None   Collection Time    01/27/14  5:10 PM      Result Value Ref Range   Troponin I <0.30  <0.30 ng/mL   Comment:            Due to the release kinetics of cTnI,     a negative result within the first hours     of the onset of symptoms does not rule out     myocardial infarction with certainty.     If myocardial infarction is still suspected,     repeat the test at appropriate intervals.  GLUCOSE, CAPILLARY     Status: Abnormal   Collection Time    01/27/14  9:57 PM      Result Value Ref Range   Glucose-Capillary 330 (*) 70 - 99 mg/dL  TROPONIN I     Status: None   Collection Time    01/27/14 11:02 PM      Result Value Ref Range   Troponin I <0.30  <0.30 ng/mL   Comment:            Due to the release kinetics of cTnI,     a negative result within the first hours     of the onset of symptoms does not rule out     myocardial  infarction with certainty.     If myocardial infarction is still suspected,     repeat the test at appropriate intervals.  CBC     Status: Abnormal   Collection Time    01/28/14  3:17 AM      Result Value Ref Range   WBC 3.8 (*) 4.0 - 10.5 K/uL   RBC 3.53 (*) 4.22 - 5.81 MIL/uL   Hemoglobin 9.8 (*) 13.0 - 17.0 g/dL   HCT 29.2 (*) 39.0 - 52.0 %   MCV 82.7  78.0 - 100.0 fL   MCH 27.8  26.0 - 34.0 pg   MCHC 33.6  30.0 - 36.0 g/dL   RDW 13.2  11.5 - 15.5 %   Platelets 140 (*) 150 - 400 K/uL  BASIC METABOLIC PANEL     Status: Abnormal   Collection Time    01/28/14  3:17 AM      Result Value Ref Range  Sodium 136 (*) 137 - 147 mEq/L   Potassium 5.2  3.7 - 5.3 mEq/L   Comment: DELTA CHECK NOTED   Chloride 101  96 - 112 mEq/L   CO2 18 (*) 19 - 32 mEq/L   Glucose, Bld 390 (*) 70 - 99 mg/dL   BUN 20  6 - 23 mg/dL   Creatinine, Ser 1.21  0.50 - 1.35 mg/dL   Calcium 9.2  8.4 - 10.5 mg/dL   GFR calc non Af Amer 62 (*) >90 mL/min   GFR calc Af Amer 72 (*) >90 mL/min   Comment: (NOTE)     The eGFR has been calculated using the CKD EPI equation.     This calculation has not been validated in all clinical situations.     eGFR's persistently <90 mL/min signify possible Chronic Kidney     Disease.  LACTIC ACID, PLASMA     Status: None   Collection Time    01/28/14  9:15 AM      Result Value Ref Range   Lactic Acid, Venous 1.5  0.5 - 2.2 mmol/L  PLATELET INHIBITION P2Y12     Status: None   Collection Time    01/28/14  9:15 AM      Result Value Ref Range   Platelet Function  P2Y12 339  194 - 418 PRU   Comment:            The literature has shown a direct     correlation of PRU values over     230 with higher risks of     thrombotic events.  Lower PRU     values are associated with     platelet inhibition.      Component Value Date/Time   SDES URINE, CLEAN CATCH 11/17/2011 1956   SPECREQUEST NONE 11/17/2011 1956   CULT INSIGNIFICANT GROWTH 11/17/2011 1956   REPTSTATUS 11/19/2011  FINAL 11/17/2011 1956   Dg Chest 2 View  01/27/2014   CLINICAL DATA:  Chest pain, shortness of breath  EXAM: CHEST  2 VIEW  COMPARISON:  01/07/2014  FINDINGS: There is no focal parenchymal opacity, pleural effusion, or pneumothorax. The heart and mediastinal contours are unremarkable.  There is mild degenerative disc disease of the lumbar spine.  IMPRESSION: No active cardiopulmonary disease.   Electronically Signed   By: Kathreen Devoid   On: 01/27/2014 11:10   Recent Results (from the past 240 hour(s))  MRSA PCR SCREENING     Status: None   Collection Time    01/27/14  3:37 PM      Result Value Ref Range Status   MRSA by PCR NEGATIVE  NEGATIVE Final   Comment:            The GeneXpert MRSA Assay (FDA     approved for NASAL specimens     only), is one component of a     comprehensive MRSA colonization     surveillance program. It is not     intended to diagnose MRSA     infection nor to guide or     monitor treatment for     MRSA infections.    HIV 1 RNA Quant (copies/mL)  Date Value  11/02/2013 <20   04/07/2013 <20   07/27/2012 <20      CD4 T Cell Abs (/uL)  Date Value  11/02/2013 380*  04/07/2013 450   07/27/2012 360*   Lab Results  Component Value Date   CHOL 146 11/02/2013  HDL 42 11/02/2013   LDLCALC 79 11/02/2013   TRIG 124 11/02/2013   CHOLHDL 3.5 11/02/2013      01/28/2014, 11:29 AM     LOS: 1 day

## 2014-01-29 LAB — BASIC METABOLIC PANEL
BUN: 28 mg/dL — ABNORMAL HIGH (ref 6–23)
CO2: 21 meq/L (ref 19–32)
CREATININE: 1.25 mg/dL (ref 0.50–1.35)
Calcium: 9.1 mg/dL (ref 8.4–10.5)
Chloride: 107 mEq/L (ref 96–112)
GFR, EST AFRICAN AMERICAN: 69 mL/min — AB (ref >90)
GFR, EST NON AFRICAN AMERICAN: 60 mL/min — AB (ref >90)
Glucose, Bld: 213 mg/dL — ABNORMAL HIGH (ref 70–99)
Potassium: 4.3 mEq/L (ref 3.7–5.3)
SODIUM: 140 meq/L (ref 137–147)

## 2014-01-29 LAB — GLUCOSE, CAPILLARY
GLUCOSE-CAPILLARY: 197 mg/dL — AB (ref 70–99)
Glucose-Capillary: 346 mg/dL — ABNORMAL HIGH (ref 70–99)
Glucose-Capillary: 214 mg/dL — ABNORMAL HIGH (ref 70–99)

## 2014-01-29 MED ORDER — HYDROCODONE-HOMATROPINE 5-1.5 MG/5ML PO SYRP
5.0000 mL | ORAL_SOLUTION | Freq: Four times a day (QID) | ORAL | Status: DC
  Administered 2014-01-29 – 2014-01-31 (×10): 5 mL via ORAL
  Filled 2014-01-29 (×10): qty 5

## 2014-01-29 MED ORDER — IPRATROPIUM-ALBUTEROL 0.5-2.5 (3) MG/3ML IN SOLN
3.0000 mL | Freq: Four times a day (QID) | RESPIRATORY_TRACT | Status: DC
  Administered 2014-01-30 – 2014-01-31 (×6): 3 mL via RESPIRATORY_TRACT
  Filled 2014-01-29 (×6): qty 3

## 2014-01-29 MED ORDER — INSULIN ASPART 100 UNIT/ML ~~LOC~~ SOLN
0.0000 [IU] | Freq: Three times a day (TID) | SUBCUTANEOUS | Status: DC
  Administered 2014-01-30: 4 [IU] via SUBCUTANEOUS
  Administered 2014-01-30: 11 [IU] via SUBCUTANEOUS
  Administered 2014-01-30 – 2014-01-31 (×3): 4 [IU] via SUBCUTANEOUS
  Administered 2014-01-31: 7 [IU] via SUBCUTANEOUS

## 2014-01-29 NOTE — Progress Notes (Signed)
Subjective:  Still short of breath with upper nasal congestion and pleuritic type chest pain  Objective:  Vital Signs in the last 24 hours: BP 144/87  Pulse 93  Temp(Src) 97.8 F (36.6 C) (Oral)  Resp 18  Ht 5' 7.5" (1.715 m)  Wt 83.3 kg (183 lb 10.3 oz)  BMI 28.32 kg/m2  SpO2 100%  Physical Exam: Mildly obese black male in no acute distress actively coughing Lungs: mild rhonchi Cardiac:  Regular rhythm, normal S1 and S2, no S3 Extremities:  No edema present  Intake/Output from previous day: 04/24 0701 - 04/25 0700 In: 720 [P.O.:720] Out: 1375 [Urine:1375]  Weight Filed Weights   01/27/14 1026 01/27/14 1445  Weight: 83.915 kg (185 lb) 83.3 kg (183 lb 10.3 oz)    Lab Results: Basic Metabolic Panel:  Recent Labs  01/28/14 0317 01/29/14 0538  NA 136* 140  K 5.2 4.3  CL 101 107  CO2 18* 21  GLUCOSE 390* 213*  BUN 20 28*  CREATININE 1.21 1.25   CBC:  Recent Labs  01/27/14 1043 01/28/14 0317  WBC 3.3* 3.8*  NEUTROABS 1.9  --   HGB 10.0* 9.8*  HCT 30.4* 29.2*  MCV 84.7 82.7  PLT 135* 140*   Cardiac Enzymes:  Recent Labs  01/27/14 1043 01/27/14 1710 01/27/14 2302  TROPONINI <0.30 <0.30 <0.30    Telemetry: Sinus rhythm  Assessment/Plan:  1. Acute noncardiac shortness of breath with pleuritic chest pain 2. Intermediate risk myocardial perfusion scan but with previous LAD stent-I don't think he needs to have further workup from this and this is not ischemic chest pain. He has evidence of a previous distal anterior infarction but with preserved ejection fraction by nuclear scan.  Recommendations:  Continue cardiac medicines and we'll sign off at this point.     Kerry Hough  MD [facility]  01/29/2014, 10:22 AM

## 2014-01-29 NOTE — Progress Notes (Signed)
Pt is concerned and wants to know if he will have an inhaler and nebulizer treatment for when he is discharged.  He said he would try to remember to talk to the doctor in the morning. Thanks. Pt resting with call bell within reach.  Will continue to monitor. Payton Emerald, RN

## 2014-01-29 NOTE — Progress Notes (Signed)
Subjective: Michael [name] was seen and examined at bedside this morning.  He still complains of intermittent cough with pain and is wheezing again this morning.  He says he has improved slightly since admission but still short of breath with exertion.   Objective: Vital signs in last 24 hours: Filed Vitals:   01/28/14 2017 01/28/14 2100 01/29/14 0056 01/29/14 0459  BP:  144/87    Pulse:  93    Temp:  97.8 F (36.6 C)    TempSrc:  Oral    Resp:  18    Height:      Weight:      SpO2: 97% 95% 96% 96%   Weight change:   Intake/Output Summary (Last 24 hours) at 01/29/14 0756 Last data filed at 01/28/14 2230  Gross per 24 hour  Intake    720 ml  Output   1375 ml  Net   -655 ml   Physical Exam: General: Alert, NAD.  HEENT: EOMI Lungs: b/l expiratory wheezing, more notable after coughing Heart: RRR, no murmurs, gallops, or rubs Abdomen: Soft, obese, non-tender, BS + Extremities: moving all 4 extremities Neurologic: Alert & oriented X3, sensation grossly intact  Lab Results: Basic Metabolic Panel:  Recent Labs Lab 01/28/14 0317 01/29/14 0538  NA 136* 140  K 5.2 4.3  CL 101 107  CO2 18* 21  GLUCOSE 390* 213*  BUN 20 28*  CREATININE 1.21 1.25  CALCIUM 9.2 9.1   CBC:  Recent Labs Lab 01/27/14 1043 01/28/14 0317  WBC 3.3* 3.8*  NEUTROABS 1.9  --   HGB 10.0* 9.8*  HCT 30.4* 29.2*  MCV 84.7 82.7  PLT 135* 140*   Cardiac Enzymes:  Recent Labs Lab 01/27/14 1043 01/27/14 1710 01/27/14 2302  TROPONINI <0.30 <0.30 <0.30   BNP:  Recent Labs Lab 01/27/14 1043  PROBNP 227.4*   CBG:  Recent Labs Lab 01/27/14 1709 01/27/14 2157 01/28/14 1212 01/28/14 1653 01/28/14 2213 01/29/14 0549  GLUCAP 140* 330* 236* 277* 208* 197*   Urine Drug Screen: Drugs of Abuse     Component Value Date/Time   LABOPIA NONE DETECTED 01/27/2014 1137   COCAINSCRNUR NONE DETECTED 01/27/2014 1137   LABBENZ NONE DETECTED 01/27/2014 1137   AMPHETMU NONE DETECTED 01/27/2014  1137   THCU NONE DETECTED 01/27/2014 1137   LABBARB NONE DETECTED 01/27/2014 1137    Micro Results: Recent Results (from the past 240 hour(s))  MRSA PCR SCREENING     Status: None   Collection Time    01/27/14  3:37 PM      Result Value Ref Range Status   MRSA by PCR NEGATIVE  NEGATIVE Final   Comment:            The GeneXpert MRSA Assay (FDA     approved for NASAL specimens     only), is one component of a     comprehensive MRSA colonization     surveillance program. It is not     intended to diagnose MRSA     infection nor to guide or     monitor treatment for     MRSA infections.   Studies/Results: Dg Chest 2 View  01/27/2014   CLINICAL DATA:  Chest pain, shortness of breath  EXAM: CHEST  2 VIEW  COMPARISON:  01/07/2014  FINDINGS: There is no focal parenchymal opacity, pleural effusion, or pneumothorax. The heart and mediastinal contours are unremarkable.  There is mild degenerative disc disease of the lumbar spine.  IMPRESSION: No active  cardiopulmonary disease.   Electronically Signed   By: Kathreen Devoid   On: 01/27/2014 11:10   Nm Myocar Multi W/spect Tamela Oddi Motion / Ef  01/28/2014   CLINICAL DATA:  Michael [name] is a 63 yo with hx of CAD - s/p anterior MI in Sept. 2014. He was scheduled for a Lexiscan Myoview study after presenting to [facility] with recurrent chest pain.  EXAM: MYOCARDIAL IMAGING WITH SPECT (REST AND PHARMACOLOGIC-STRESS)  GATED LEFT VENTRICULAR WALL MOTION STUDY  LEFT VENTRICULAR EJECTION FRACTION  TECHNIQUE: Standard myocardial SPECT imaging was performed after resting intravenous injection of 10 mCi Tc-34m sestamibi. Subsequently, intravenous infusion of Lexiscan was performed under the supervision of the Cardiology staff. At peak effect of the drug, 30 mCi Tc-70m sestamibi was injected intravenously and standard myocardial SPECT imaging was performed. Quantitative gated imaging was also performed to evaluate left ventricular wall motion, and estimate left  ventricular ejection fraction.  COMPARISON:  None.  FINDINGS: The patient's EKG at rest reveals normal sinus rhythm. He has no ST or T wave changes. There were no ST or T wave changes during or after the Lexiscan infusion.  The raw data images reveals minimal motion artifact.  The stress Myoview images reveals a medium-sized, moderately severe area of attenuation of the distal anterior and anteroapical regions. The uptake in the other regions is fairly well preserved. The rest Myoview images reveals a small to moderate sized area of mild to moderate attenuation in the distal anterior and anteroapical segments. These images are consistent with a previous distal anterior wall myocardial infarction with a small amount of peri-infarct ischemia. The SDS = 8.  The the gated images reveal reveals an end-diastolic volume of 79 mL. He end-systolic volume is 32 mm. Ejection ejection fraction is 60%. This any images reveals no significant wall motion abnormality. The overall left systolic function is well-preserved.  IMPRESSION: This is interpreted as an intermediate risk Myoview study. Patient has evidence of a previous medium-sized distal anterior wall myocardial infarction with a very small amount of peri-infarct ischemia. His overall left systolic function is well-preserved. The distal anterior wall and the apex seem to contract fairly well. Ejection fraction is 60%.   Electronically Signed   By: Mertie Moores   On: 01/28/2014 14:08   Medications: I have reviewed the patient's current medications. Scheduled Meds: . allopurinol  200 mg Oral q morning - 10a  . aspirin  81 mg Oral Daily  . atorvastatin  10 mg Oral q1800  . clopidogrel  75 mg Oral Q breakfast  . diltiazem  120 mg Oral Daily  . DULoxetine  60 mg Oral Daily  . emtricitabine-tenofovir  1 tablet Oral Daily  . enoxaparin (LOVENOX) injection  40 mg Subcutaneous Q24H  . gabapentin  300 mg Oral TID  . guaiFENesin-dextromethorphan  5 mL Oral Q4H  .  HYDROcodone-homatropine  5 mL Oral 4 times per day  . insulin aspart  0-15 Units Subcutaneous TID WC  . insulin glargine  30 Units Subcutaneous Daily  . ipratropium-albuterol  3 mL Nebulization Q4H  . predniSONE  40 mg Oral Q breakfast  . regadenoson  0.4 mg Intravenous Once  . rilpivirine  25 mg Oral Q breakfast  . sodium chloride  3 mL Intravenous Q12H   Continuous Infusions:   PRN Meds:.ipratropium-albuterol, morphine injection  Assessment/Plan: Michael [name] is a 63 y.o. male w/ PMHx of HTN, HLD, DM type II, HIV/AIDS (CD4 380, VL <20), CAD (s/p multiple stent placement),  Asthma, Gout, and depression, admitted for mild to moderate asthma exacerbation.   Asthma Exacerbation- not much improved since yesterday, still wheezing and coughing today. Not requiring supplemental oxygen but short of breath with exertion.  -Duonebs q4h scheduled with q2 prn -continue po prednisone -O2 via [place] (2L) prn -Robitussin-DM prn for cough + Hycodan (will schedule since none given to patient yesterday) -Morphine 2 mg q4h prn for pain  -Peak flow monitoring pre- and post- nebs--holding steady with ~275 pre and post treatment -Zofran prn nausea  -up with assistance -may try incentive spirometer if cough improves  CAD-follows at [facility], recent re-stenting in Jan 2015.  On ASA and plavix and reports compliance but does admit to recurrent cocaine use as recently as the end of March. Troponins negative x3, s/p myoview 4/24: intermediate risk study, L systolic function well preserved, EF 60%.  Cardiology following.  -appreciate cardiology following--?cath -Continue ASA 81 + Plavix 75 mg po qd  -Morphine prn pain -Lipitor 10 mg qhs  -Continue Cardizem CD 120 mg po qd  -Continue to hold Toprol XL in setting of asthma exercation.   HIV/AIDS -Continue Truvada + Edurant  -appreciate [name] following -viral load and CD4 count pending  DM type II- CBG's improving.  -Continue Lantus to 30  units (home dose); patient now eating -ISS-M + CBG's AC/HS  -Check A1C  Depression -Continue Cymbalta 60 mg po qd -we had a long discussion yesterday in regards to substance abuse and him being at home often and that is when his mind wanders or gets bored and then he says when he meets other people he has trouble resisting the temptation.  However, he is very regretful with his recent cocaine use relapse as is his son and he hopes he can stay away from it. -I encouraged cessation again and he will need further outpatient psych and pcp follow up.   Cocaine dependence--last use March 2015.  UDS on admission negative.  He is hopeful he can stay clean. STRONGLY URGED cessation. Offered possible assistance with social work or Tourist information centre manager if he wishes to get more information on community resources.   DVT/PE PPx- Lovenox [place] Diet: Carb modified Dispo: Disposition is deferred at this time, awaiting improvement of current medical problems.  Anticipated discharge in approximately 1-2 day(s).   The patient does have a current PCP (Neema Bobbie Stack, MD) and does need an [facility] hospital follow-up appointment after discharge.  The patient does not have transportation limitations that hinder transportation to clinic appointments.  Services Needed at time of discharge: Y = Yes, Blank = No PT:   OT:   RN:   Equipment:   Other:     LOS: 2 days   Jerene Pitch, MD 01/29/2014, 7:56 AM

## 2014-01-29 NOTE — Progress Notes (Signed)
RT Note- peak flow 320l/min

## 2014-01-30 LAB — GLUCOSE, CAPILLARY
Glucose-Capillary: 196 mg/dL — ABNORMAL HIGH (ref 70–99)
Glucose-Capillary: 163 mg/dL — ABNORMAL HIGH (ref 70–99)
Glucose-Capillary: 261 mg/dL — ABNORMAL HIGH (ref 70–99)

## 2014-01-30 LAB — HEMOGLOBIN A1C
HEMOGLOBIN A1C: 7.5 % — AB (ref <5.7)
MEAN PLASMA GLUCOSE: 169 mg/dL — AB (ref <117)

## 2014-01-30 MED ORDER — GUAIFENESIN-DM 100-10 MG/5ML PO SYRP
5.0000 mL | ORAL_SOLUTION | Freq: Four times a day (QID) | ORAL | Status: DC | PRN
  Administered 2014-01-31: 5 mL via ORAL

## 2014-01-30 NOTE — Progress Notes (Signed)
Respiratory therapy Note-PEF performed 330l/min

## 2014-01-30 NOTE — Progress Notes (Signed)
RT Note-Incentive spirometer given and achieved 2249ml x10 and re peat peak flow of 350l/min

## 2014-01-30 NOTE — Progress Notes (Addendum)
Subjective: [name] was seen and examined at bedside this morning.  He reports feeling much improved since admission but still DOE.  He says he slept very well overnight and less pain with coughing since starting the Hycodan.  He wishes to go home with nebulizers as well.   He also discussed his living situation with me at his shelter and needing a note that he was in the hospital and to be able to rest when needed.   Objective: Vital signs in last 24 hours: Filed Vitals:   01/29/14 0804 01/29/14 1441 01/29/14 2113 01/30/14 0531  BP:  131/87 120/81 119/77  Pulse:  100 102 85  Temp:  98.1 F (36.7 C) 98.3 F (36.8 C) 98.3 F (36.8 C)  TempSrc:  Oral Oral Oral  Resp:  18 18 18   Height:      Weight:      SpO2: 100% 100% 100% 99%   Weight change:   Intake/Output Summary (Last 24 hours) at 01/30/14 0929 Last data filed at 01/30/14 0901  Gross per 24 hour  Intake    720 ml  Output    300 ml  Net    420 ml   Physical Exam: General: Alert, NAD.  HEENT: EOMI Lungs: good effort, slight left upper lobe expiratory wheeze that cleared up after coughing, but is still coughing with deep inspiration Heart: RRR Abdomen: Soft, obese, non-tender, BS + Extremities: moving all 4 extremities, -edema Neurologic: Alert & oriented X3, sensation grossly intact  Lab Results: Basic Metabolic Panel:  Recent Labs Lab 01/28/14 0317 01/29/14 0538  NA 136* 140  K 5.2 4.3  CL 101 107  CO2 18* 21  GLUCOSE 390* 213*  BUN 20 28*  CREATININE 1.21 1.25  CALCIUM 9.2 9.1   CBC:  Recent Labs Lab 01/27/14 1043 01/28/14 0317  WBC 3.3* 3.8*  NEUTROABS 1.9  --   HGB 10.0* 9.8*  HCT 30.4* 29.2*  MCV 84.7 82.7  PLT 135* 140*   Cardiac Enzymes:  Recent Labs Lab 01/27/14 1043 01/27/14 1710 01/27/14 2302  TROPONINI <0.30 <0.30 <0.30   BNP:  Recent Labs Lab 01/27/14 1043  PROBNP 227.4*   CBG:  Recent Labs Lab 01/28/14 1653 01/28/14 2213 01/29/14 0549 01/29/14 1619  01/29/14 2111 01/30/14 0610  GLUCAP 277* 208* 197* 346* 214* 196*   Urine Drug Screen: Drugs of Abuse     Component Value Date/Time   LABOPIA NONE DETECTED 01/27/2014 1137   COCAINSCRNUR NONE DETECTED 01/27/2014 1137   LABBENZ NONE DETECTED 01/27/2014 1137   AMPHETMU NONE DETECTED 01/27/2014 1137   THCU NONE DETECTED 01/27/2014 1137   LABBARB NONE DETECTED 01/27/2014 1137    Micro Results: Recent Results (from the past 240 hour(s))  MRSA PCR SCREENING     Status: None   Collection Time    01/27/14  3:37 PM      Result Value Ref Range Status   MRSA by PCR NEGATIVE  NEGATIVE Final   Comment:            The GeneXpert MRSA Assay (FDA     approved for NASAL specimens     only), is one component of a     comprehensive MRSA colonization     surveillance program. It is not     intended to diagnose MRSA     infection nor to guide or     monitor treatment for     MRSA infections.   Studies/Results: Nm Myocar Multi W/spect W/wall  Motion / Ef  01/28/2014   CLINICAL DATA:  Jhony Gul is a 63 yo with hx of CAD - s/p anterior MI in Sept. 2014. He was scheduled for a Lexiscan Myoview study after presenting to [facility] with recurrent chest pain.  EXAM: MYOCARDIAL IMAGING WITH SPECT (REST AND PHARMACOLOGIC-STRESS)  GATED LEFT VENTRICULAR WALL MOTION STUDY  LEFT VENTRICULAR EJECTION FRACTION  TECHNIQUE: Standard myocardial SPECT imaging was performed after resting intravenous injection of 10 mCi Tc-59m sestamibi. Subsequently, intravenous infusion of Lexiscan was performed under the supervision of the Cardiology staff. At peak effect of the drug, 30 mCi Tc-33m sestamibi was injected intravenously and standard myocardial SPECT imaging was performed. Quantitative gated imaging was also performed to evaluate left ventricular wall motion, and estimate left ventricular ejection fraction.  COMPARISON:  None.  FINDINGS: The patient's EKG at rest reveals normal sinus rhythm. He has no ST or T wave changes.  There were no ST or T wave changes during or after the Lexiscan infusion.  The raw data images reveals minimal motion artifact.  The stress Myoview images reveals a medium-sized, moderately severe area of attenuation of the distal anterior and anteroapical regions. The uptake in the other regions is fairly well preserved. The rest Myoview images reveals a small to moderate sized area of mild to moderate attenuation in the distal anterior and anteroapical segments. These images are consistent with a previous distal anterior wall myocardial infarction with a small amount of peri-infarct ischemia. The SDS = 8.  The the gated images reveal reveals an end-diastolic volume of 79 mL. He end-systolic volume is 32 mm. Ejection ejection fraction is 60%. This any images reveals no significant wall motion abnormality. The overall left systolic function is well-preserved.  IMPRESSION: This is interpreted as an intermediate risk Myoview study. Patient has evidence of a previous medium-sized distal anterior wall myocardial infarction with a very small amount of peri-infarct ischemia. His overall left systolic function is well-preserved. The distal anterior wall and the apex seem to contract fairly well. Ejection fraction is 60%.   Electronically Signed   By: Mertie Moores   On: 01/28/2014 14:08   Medications: I have reviewed the patient's current medications. Scheduled Meds: . allopurinol  200 mg Oral q morning - 10a  . aspirin  81 mg Oral Daily  . atorvastatin  10 mg Oral q1800  . clopidogrel  75 mg Oral Q breakfast  . diltiazem  120 mg Oral Daily  . DULoxetine  60 mg Oral Daily  . emtricitabine-tenofovir  1 tablet Oral Daily  . enoxaparin (LOVENOX) injection  40 mg Subcutaneous Q24H  . gabapentin  300 mg Oral TID  . guaiFENesin-dextromethorphan  5 mL Oral Q4H  . HYDROcodone-homatropine  5 mL Oral 4 times per day  . insulin aspart  0-20 Units Subcutaneous TID WC  . insulin glargine  30 Units Subcutaneous Daily    . ipratropium-albuterol  3 mL Nebulization QID  . predniSONE  40 mg Oral Q breakfast  . rilpivirine  25 mg Oral Q breakfast  . sodium chloride  3 mL Intravenous Q12H   Continuous Infusions:   PRN Meds:.ipratropium-albuterol, morphine injection  Assessment/Plan: Mr. BETTY DADA is a 63 y.o. male w/ PMHx of HTN, HLD, DM type II, HIV/AIDS (CD4 380, VL <20), CAD (s/p multiple stent placement), Asthma, Gout, and depression, admitted for mild to moderate asthma exacerbation.   Asthma Exacerbation- continues to improve with improved peakflow ~320, less cough and wheezing.  Encouraged ambulation today. -Duonebs  q4h scheduled with q2 prn -continue po prednisone -O2 via [place] (2L) prn -Robitussin-DM prn for cough + Hycodan -d/c morphine -Peak flow monitoring pre- and post- nebs -Zofran prn nausea  -up with assistance, ambulate and check pulse ox with ambulation today -incentive spirometry  CAD-follows at [facility], recent re-stenting in Jan 2015.  On ASA and plavix and reports compliance but does admit to recurrent cocaine use as recently as the end of March. Troponins negative x3, s/p myoview 4/24: intermediate risk study, L systolic function well preserved, EF 60%.  Cardiology following.  -seen by Dr. [name] yesterday, signed off, no furthe work up at this time. Preserved EF per nuclear scan and previous distal anterior infarction.  -Continue ASA 81 + Plavix 75 mg po qd  -Morphine prn pain -Lipitor 10 mg qhs  -Continue Cardizem CD 120 mg po qd -Continue to hold Toprol XL in setting of asthma exercation. ACEi also held given well controlled BP and slight rise in Cr this admission.  May resume on discharge. Of note, last microalbumin/cr ratio 11.9 04/2013 and 30 of protein on U/A on admission.   HIV/AIDS -Continue Truvada + Edurant  -appreciate [name] following, will follow up as outpatient -viral load and CD4 count pending  DM type II- CBG's improving.  -Continue Lantus to 30  units (home dose); patient now eating -ISS-R + CBG's AC/HS  -HbA1C pending  Depression -Continue Cymbalta 60 mg po qd -consider outpatient [facility] or psych if patient wishes   Cocaine dependence--last use March 2015.  UDS on admission negative.  He remains hopeful he can stay clean. -continue to encourage cessation  DVT/PE PPx- Lovenox [place] Diet: Carb modified Dispo: possible d/c home tomorrow pending further clinical improvement  The patient does have a current PCP (Neema Bobbie Stack, MD) and does need an [facility] hospital follow-up appointment after discharge.  The patient does not have transportation limitations that hinder transportation to clinic appointments.  Services Needed at time of discharge: Y = Yes, Blank = No PT:   OT:   RN:   Equipment:   Other:     LOS: 3 days   Jerene Pitch, MD 01/30/2014, 9:29 AM

## 2014-01-30 NOTE — Progress Notes (Signed)
INFECTIOUS DISEASE PROGRESS NOTE  ID: [place] Michael [name] is a 63 y.o. male with  Principal Problem:   Acute asthma exacerbation Active Problems:   HIV (human immunodeficiency virus infection)   Uncontrolled type 2 diabetes with neuropathy   Cocaine abuse   Hyperlipidemia LDL goal < 100   Hypertension goal BP (blood pressure) < 140/80   3-vessel CAD   Chest pain  Subjective: Feels better, excited to be going home in AM. Had brief episode of CP this Am after coughing hard.   Abtx:  Anti-infectives   Start     Dose/Rate Route Frequency Ordered Stop   01/27/14 1700  rilpivirine (EDURANT) tablet 25 mg     25 mg Oral Daily with breakfast 01/27/14 1548     01/27/14 1700  emtricitabine-tenofovir (TRUVADA) 200-300 MG per tablet 1 tablet     1 tablet Oral Daily 01/27/14 1548     01/27/14 1545  Emtricitab-Rilpivir-Tenofovir 200-25-300 MG TABS 1 tablet  Status:  Discontinued     1 tablet Oral Daily 01/27/14 1539 01/27/14 1547      Medications:  Scheduled: . allopurinol  200 mg Oral q morning - 10a  . aspirin  81 mg Oral Daily  . atorvastatin  10 mg Oral q1800  . clopidogrel  75 mg Oral Q breakfast  . diltiazem  120 mg Oral Daily  . DULoxetine  60 mg Oral Daily  . emtricitabine-tenofovir  1 tablet Oral Daily  . enoxaparin (LOVENOX) injection  40 mg Subcutaneous Q24H  . gabapentin  300 mg Oral TID  . guaiFENesin-dextromethorphan  5 mL Oral Q4H  . HYDROcodone-homatropine  5 mL Oral 4 times per day  . insulin aspart  0-20 Units Subcutaneous TID WC  . insulin glargine  30 Units Subcutaneous Daily  . ipratropium-albuterol  3 mL Nebulization QID  . predniSONE  40 mg Oral Q breakfast  . rilpivirine  25 mg Oral Q breakfast  . sodium chloride  3 mL Intravenous Q12H    Objective: Vital signs in last 24 hours: Temp:  [98.1 F (36.7 C)-98.3 F (36.8 C)] 98.3 F (36.8 C) (04/26 0531) Pulse Rate:  [85-102] 85 (04/26 0531) Resp:  [18] 18 (04/26 0531) BP: (119-131)/(77-87) 119/77 mmHg  (04/26 0531) SpO2:  [99 %-100 %] 99 % (04/26 0531)   General appearance: alert, cooperative and no distress Resp: clear to auscultation bilaterally Cardio: regular rate and rhythm GI: normal findings: bowel sounds normal and soft, non-tender  Lab Results  Recent Labs  01/27/14 1043 01/28/14 0317 01/29/14 0538  WBC 3.3* 3.8*  --   HGB 10.0* 9.8*  --   HCT 30.4* 29.2*  --   NA 141 136* 140  K 3.8 5.2 4.3  CL 105 101 107  CO2 20 18* 21  BUN 17 20 28*  CREATININE 1.16 1.21 1.25   Liver Panel No results found for this basename: PROT, ALBUMIN, AST, ALT, ALKPHOS, BILITOT, BILIDIR, IBILI,  in the last 72 hours Sedimentation Rate No results found for this basename: ESRSEDRATE,  in the last 72 hours C-Reactive Protein No results found for this basename: CRP,  in the last 72 hours  Microbiology: Recent Results (from the past 240 hour(s))  MRSA PCR SCREENING     Status: None   Collection Time    01/27/14  3:37 PM      Result Value Ref Range Status   MRSA by PCR NEGATIVE  NEGATIVE Final   Comment:  The GeneXpert MRSA Assay (FDA     approved for NASAL specimens     only), is one component of a     comprehensive MRSA colonization     surveillance program. It is not     intended to diagnose MRSA     infection nor to guide or     monitor treatment for     MRSA infections.    Studies/Results: Nm Myocar Multi W/spect W/wall Motion / Ef  01/28/2014   CLINICAL DATA:  Michael [name] is a 63 yo with hx of CAD - s/p anterior MI in Sept. 2014. He was scheduled for a Lexiscan Myoview study after presenting to [facility] with recurrent chest pain.  EXAM: MYOCARDIAL IMAGING WITH SPECT (REST AND PHARMACOLOGIC-STRESS)  GATED LEFT VENTRICULAR WALL MOTION STUDY  LEFT VENTRICULAR EJECTION FRACTION  TECHNIQUE: Standard myocardial SPECT imaging was performed after resting intravenous injection of 10 mCi Tc-81m sestamibi. Subsequently, intravenous infusion of Lexiscan was performed under  the supervision of the Cardiology staff. At peak effect of the drug, 30 mCi Tc-71m sestamibi was injected intravenously and standard myocardial SPECT imaging was performed. Quantitative gated imaging was also performed to evaluate left ventricular wall motion, and estimate left ventricular ejection fraction.  COMPARISON:  None.  FINDINGS: The patient's EKG at rest reveals normal sinus rhythm. He has no ST or T wave changes. There were no ST or T wave changes during or after the Lexiscan infusion.  The raw data images reveals minimal motion artifact.  The stress Myoview images reveals a medium-sized, moderately severe area of attenuation of the distal anterior and anteroapical regions. The uptake in the other regions is fairly well preserved. The rest Myoview images reveals a small to moderate sized area of mild to moderate attenuation in the distal anterior and anteroapical segments. These images are consistent with a previous distal anterior wall myocardial infarction with a small amount of peri-infarct ischemia. The SDS = 8.  The the gated images reveal reveals an end-diastolic volume of 79 mL. He end-systolic volume is 32 mm. Ejection ejection fraction is 60%. This any images reveals no significant wall motion abnormality. The overall left systolic function is well-preserved.  IMPRESSION: This is interpreted as an intermediate risk Myoview study. Patient has evidence of a previous medium-sized distal anterior wall myocardial infarction with a very small amount of peri-infarct ischemia. His overall left systolic function is well-preserved. The distal anterior wall and the apex seem to contract fairly well. Ejection fraction is 60%.   Electronically Signed   By: Mertie Moores   On: 01/28/2014 14:08     Assessment/Plan: Asthma exacerbation CAD HIV  He is doing much better, my great appreciation to CV and IMTS.  He would like to go home with nebulizer for home use.  Will see him in my clinic at  f/u  Total days of antibiotics 0         [name] (pager) [phone] [email or web address] 01/30/2014, 8:59 AM  LOS: 3 days   **Disclaimer: This note may have been dictated with voice recognition software. Similar sounding words can inadvertently be transcribed and this note may contain transcription errors which may not have been corrected upon publication of note.**

## 2014-01-31 LAB — GLUCOSE, CAPILLARY
GLUCOSE-CAPILLARY: 177 mg/dL — AB (ref 70–99)
Glucose-Capillary: 257 mg/dL — ABNORMAL HIGH (ref 70–99)
Glucose-Capillary: 210 mg/dL — ABNORMAL HIGH (ref 70–99)
Glucose-Capillary: 171 mg/dL — ABNORMAL HIGH (ref 70–99)

## 2014-01-31 LAB — T-HELPER CELLS (CD4) COUNT (NOT AT ARMC)
CD4 T CELL ABS: 270 /uL — AB (ref 400–2700)
CD4 T CELL HELPER: 37 % (ref 33–55)

## 2014-01-31 LAB — HIV-1 RNA QUANT-NO REFLEX-BLD
HIV 1 RNA QUANT: 233 {copies}/mL — AB (ref <20)
HIV-1 RNA QUANT, LOG: 2.37 {Log} — AB (ref <1.30)

## 2014-01-31 MED ORDER — HYDROCODONE-HOMATROPINE 5-1.5 MG/5ML PO SYRP: 5.0000 mL | ORAL_SOLUTION | Freq: Four times a day (QID) | ORAL | Status: DC

## 2014-01-31 MED ORDER — LORAZEPAM 0.5 MG PO TABS: 0.5000 mg | ORAL_TABLET | Freq: Three times a day (TID) | ORAL | Status: DC

## 2014-01-31 MED ORDER — IPRATROPIUM-ALBUTEROL 0.5-2.5 (3) MG/3ML IN SOLN: 3.0000 mL | RESPIRATORY_TRACT | Status: DC | PRN

## 2014-01-31 MED ORDER — LORAZEPAM 0.5 MG PO TABS
0.5000 mg | ORAL_TABLET | Freq: Once | ORAL | Status: AC
  Administered 2014-01-31: 0.5 mg via ORAL
  Filled 2014-01-31: qty 1

## 2014-01-31 MED FILL — Hepatitis B Vaccine (Recombinant) Susp 10 MCG/ML: INTRAMUSCULAR | Qty: 1 | Status: AC

## 2014-01-31 NOTE — Discharge Instructions (Signed)
1. Follow up appointments as below:  Othella Boyer  On 02/07/2014 9:15 AM  [place] [address] [phone]  Mercie Eon  Please follow up in 2-4 weeks.  [place] [address] [phone]  [facility] Rumpf [facility]  Please follow up in 2-4 weeks.  [address] [place] [address] [phone]  [facility]  Please see at your convenience for better management of depression and anxiety.  [address] [place] [address] [phone]  2. Please take all medications as prescribed:  Please use Duonebs w/ nebulizer every 2 hours as needed for shortness of breath.  Please continue to take Prednisone 20 mg daily for 7 days, then 20 mg every other day for 7 days, then STOP.   3. If you have worsening of your symptoms or new symptoms arise, please call the clinic [phone]), or go to the ER immediately if symptoms are severe.    Asthma, Acute Bronchospasm Acute bronchospasm caused by asthma is also referred to as an asthma attack. Bronchospasm means your air passages become narrowed. The narrowing is caused by inflammation and tightening of the muscles in the air tubes (bronchi) in your lungs. This can make it hard to breath or cause you to wheeze and cough. CAUSES Possible triggers are:  Animal dander from the skin, hair, or feathers of animals.  Dust mites contained in house dust.  Cockroaches.  Pollen from trees or grass.  Mold.  Cigarette or tobacco smoke.  Air pollutants such as dust, household cleaners, hair sprays, aerosol sprays, paint fumes, strong chemicals, or strong odors.  Cold air or weather changes. Cold air may trigger inflammation. Winds increase molds and pollens in the air.  Strong emotions such as crying or laughing hard.  Stress.  Certain medicines such as aspirin or beta-blockers.  Sulfites in foods and drinks, such as dried fruits and wine.  Infections or inflammatory  conditions, such as a flu, cold, or inflammation of the nasal membranes (rhinitis).  Gastroesophageal reflux disease (GERD). GERD is a condition where stomach acid backs up into your throat (esophagus).  Exercise or strenuous activity. SIGNS AND SYMPTOMS   Wheezing.  Excessive coughing, particularly at night.  Chest tightness.  Shortness of breath. DIAGNOSIS  Your health care provider will ask you about your medical history and perform a physical exam. A chest X-ray or blood testing may be performed to look for other causes of your symptoms or other conditions that may have triggered your asthma attack. TREATMENT  Treatment is aimed at reducing inflammation and opening up the airways in your lungs. Most asthma attacks are treated with inhaled medicines. These include quick relief or rescue medicines (such as bronchodilators) and controller medicines (such as inhaled corticosteroids). These medicines are sometimes given through an inhaler or a nebulizer. Systemic steroid medicine taken by mouth or given through an IV tube also can be used to reduce the inflammation when an attack is moderate or severe. Antibiotic medicines are only used if a bacterial infection is present.  HOME CARE INSTRUCTIONS   Rest.  Drink plenty of liquids. This helps the mucus to remain thin and be easily coughed up. Only use caffeine in moderation and do not use alcohol until you have recovered from your illness.  Do not smoke. Avoid being exposed to secondhand smoke.  You play a critical role in keeping yourself in good health. Avoid exposure to things that cause you to wheeze or to  have breathing problems.  Keep your medicines up to date and available. Carefully follow your health care provider's treatment plan.  Take your medicine exactly as prescribed.  When pollen or pollution is bad, keep windows closed and use an air conditioner or go to places with air conditioning.  Asthma requires careful medical  care. See your health care provider for a follow-up as advised. If you are more than [redacted] weeks pregnant and you were prescribed any new medicines, let your obstetrician know about the visit and how you are doing. Follow-up with your health care provider as directed.  After you have recovered from your asthma attack, make an appointment with your outpatient doctor to talk about ways to reduce the likelihood of future attacks. If you do not have a doctor who manages your asthma, make an appointment with a primary care doctor to discuss your asthma. SEEK IMMEDIATE MEDICAL CARE IF:   You are getting worse.  You have trouble breathing. If severe, call your local emergency services (911 in the [place]  You develop chest pain or discomfort.  You are vomiting.  You are not able to keep fluids down.  You are coughing up yellow, green, brown, or bloody sputum.  You have a fever and your symptoms suddenly get worse.  You have trouble swallowing. MAKE SURE YOU:   Understand these instructions.  Will watch your condition.  Will get help right away if you are not doing well or get worse. Document Released: 01/08/2007 Document Revised: 05/26/2013 Document Reviewed: 03/31/2013 [facility] Patient Information 2014 [place], [place].   [place]  [name]  [place], [place] [address]  [phone]

## 2014-01-31 NOTE — Discharge Summary (Signed)
Name: Michael [name]: [id number] DOB: 06/14/1951 63 y.o. PCP: Othella Boyer, MD  Date of Admission: 01/27/2014 10:15 AM Date of Discharge: 01/31/14 Attending Physician: Murlean Caller  Discharge Diagnosis: 1. Mild Asthma Exacerbation 2. CAD 3. HIV/AIDS 4. Depression  Discharge Medications:   Medication List         allopurinol 100 MG tablet  Commonly known as:  ZYLOPRIM  Take 2 tablets (200 mg total) by mouth every morning. For gout.     amantadine 100 MG capsule  Commonly known as:  SYMMETREL  Take 100 mg by mouth daily.     aspirin 81 MG chewable tablet  Chew 81 mg by mouth daily. For blood thinner.     clopidogrel 75 MG tablet  Commonly known as:  PLAVIX  Take 75 mg by mouth daily with breakfast.     diclofenac sodium 1 % Gel  Commonly known as:  VOLTAREN  Apply 2 g topically 2 (two) times daily as needed (pain).     diltiazem 120 MG 24 hr capsule  Commonly known as:  CARDIZEM CD  Take 120 mg by mouth daily. Take 1 capsule (120 mg total) by mouth daily.     DULoxetine 60 MG capsule  Commonly known as:  CYMBALTA  Take 1 capsule (60 mg total) by mouth daily.     gabapentin 300 MG capsule  Commonly known as:  NEURONTIN  Take 300 mg by mouth 3 (three) times daily.     HYDROcodone-homatropine 5-1.5 MG/5ML syrup  Commonly known as:  HYCODAN  Take 5 mLs by mouth every 6 (six) hours.     insulin glargine 100 UNIT/ML injection  Commonly known as:  LANTUS  Inject 30 units daily before bed     ipratropium-albuterol 0.5-2.5 (3) MG/3ML Soln  Commonly known as:  DUONEB  Take 3 mLs by nebulization every 2 (two) hours as needed.     lisinopril 5 MG tablet  Commonly known as:  PRINIVIL,ZESTRIL  Take 1 tablet (5 mg total) by mouth daily.     LORazepam 0.5 MG tablet  Commonly known as:  ATIVAN  Take 1 tablet (0.5 mg total) by mouth every 8 (eight) hours.     metFORMIN 1000 MG tablet  Commonly known as:  GLUCOPHAGE  Take 1 tablet (1,000 mg total) by mouth 2 (two)  times daily with a meal.     metoprolol succinate 25 MG 24 hr tablet  Commonly known as:  TOPROL-XL  Take 25 mg by mouth daily.     nitroGLYCERIN 0.4 MG SL tablet  Commonly known as:  NITROSTAT  0.4 mg. Place 1 tablet (0.4 mg total) under the tongue every 5 (five) minutes as needed for Chest pain.     pravastatin 40 MG tablet  Commonly known as:  PRAVACHOL  Take 40 mg by mouth every evening.     predniSONE 20 MG tablet  Commonly known as:  DELTASONE  Take 1 tablet (20 mg total) by mouth daily with breakfast.     predniSONE 10 MG tablet  Commonly known as:  DELTASONE  Take 1 tablet (10 mg total) by mouth daily with breakfast.     PROAIR HFA 108 (90 BASE) MCG/ACT inhaler  Generic drug:  albuterol  Inhale 2 puffs into the lungs every 6 (six) hours as needed for wheezing or shortness of breath.     traMADol 50 MG tablet  Commonly known as:  ULTRAM  Take 1 tablet (50 mg total) by mouth every  6 (six) hours as needed for pain.        Disposition and follow-up:   Michael [name] was discharged from [facility] in Good condition.  At the hospital follow up visit please address:  1.  Asthma; is patient still having SOB? Wheezing? Has he been able to take his Prednisone taper (Prednisone 20 mg po qd for 1 week + 10 mg po qd for next week, sent to [employer])?   Anxiety; Has Ativan prn helped w/ anxiety and SOB? Patient was given 5 tablets to assess if anxiety is involved w/ his SOB. If so, long term management of his anxiety would most likely help w/ his respiratory symptoms. Has he been able to follow up w/ [facility]?  CAD; Any recent chest pain? Has patient used crack-cocaine since his hospital discharge? Restarted Toprol-XL 25 mg po qd on discharge. If patient is continuing to use cocaine, should completely discontinue beta-blocker.   HIV/AIDS; CD4 + VL found to be worsened during admission. Needs close follow up w/ Dr [name] w/ regards to HIV management  as well as Hepatitis vaccinations.  2.  Labs / imaging needed at time of follow-up: BMP  3.  Pending labs/ test needing follow-up: none  Follow-up Appointments: Follow-up Information   Follow up with Fransisca Kaufmann, MD On 02/07/2014. (9:15 AM)    Specialty:  Internal Medicine   Contact information:   [facility] [address] [phone]       Follow up with Mercie Eon, MD. (Please follow up in 2-4 weeks.)    Specialty:  Internal Medicine   Contact information:   [place] [address] [phone]       Follow up with Bobby Rumpf, MD. (Please follow up in 2-4 weeks.)    Specialty:  Infectious Diseases   Contact information:   [address] [place] [address] [phone]       Follow up with [facility]. (Please see at your convenience for better management of depression and anxiety.)    Contact information:   [address] [place] [address] [phone]      Follow up with [facility]. [phone])    Contact information:   [address] [place]      Discharge Instructions:  Future Appointments Provider Department Dept Phone   02/03/2014 1:45 PM [name], MD [facility] for Infectious Disease [phone]   02/07/2014 9:15 AM Othella Boyer, MD [name] [phone]      Consultations:  Cardiology, ID  Procedures Performed:  Dg Chest 2 View  01/27/2014   CLINICAL DATA:  Chest pain, shortness of breath  EXAM: CHEST  2 VIEW  COMPARISON:  01/07/2014  FINDINGS: There is no focal parenchymal opacity, pleural effusion, or pneumothorax. The heart and mediastinal contours are unremarkable.  There is mild degenerative disc disease of the lumbar spine.  IMPRESSION: No active cardiopulmonary disease.   Electronically Signed   By: Kathreen Devoid   On: 01/27/2014 11:10   Nm Myocar Multi W/spect Tamela Oddi Motion /  Ef  01/28/2014   CLINICAL DATA:  Michael [name] is a 63 yo with hx of CAD - s/p anterior MI in Sept. 2014. He was scheduled for a Lexiscan Myoview study after presenting to [facility] with recurrent chest pain.  EXAM: MYOCARDIAL IMAGING WITH SPECT (REST AND PHARMACOLOGIC-STRESS)  GATED LEFT VENTRICULAR WALL MOTION STUDY  LEFT VENTRICULAR EJECTION FRACTION  TECHNIQUE: Standard myocardial SPECT imaging was performed after resting intravenous injection of 10 mCi Tc-24m sestamibi. Subsequently, intravenous infusion of Lexiscan was performed under the supervision of the Cardiology staff. At peak effect of the drug, 30 mCi Tc-27m sestamibi was injected intravenously and standard myocardial SPECT imaging was performed. Quantitative gated imaging was also performed to evaluate left ventricular wall motion, and estimate left ventricular ejection fraction.  COMPARISON:  None.  FINDINGS: The patient's EKG at rest reveals normal sinus rhythm. He has no ST or T wave changes. There were no ST or T wave changes during or after the Lexiscan infusion.  The raw data images reveals minimal motion artifact.  The stress Myoview images reveals a medium-sized, moderately severe area of attenuation of the distal anterior and anteroapical regions. The uptake in the other regions is fairly well preserved. The rest Myoview images reveals a small to moderate sized area of mild to moderate attenuation in the distal anterior and anteroapical segments. These images are consistent with a previous distal anterior wall myocardial infarction with a small amount of peri-infarct ischemia. The SDS = 8.  The the gated images reveal reveals an end-diastolic volume of 79 mL. He end-systolic volume is 32 mm. Ejection ejection fraction is 60%. This any images reveals no significant wall motion abnormality. The overall left systolic function is well-preserved.  IMPRESSION: This is interpreted as an intermediate risk Myoview study. Patient has evidence of a  previous medium-sized distal anterior wall myocardial infarction with a very small amount of peri-infarct ischemia. His overall left systolic function is well-preserved. The distal anterior wall and the apex seem to contract fairly well. Ejection fraction is 60%.   Electronically Signed   By: Mertie Moores   On: 01/28/2014 14:08   Admission HPI:  Mr. ESKER AGARD is a 63 y.o. male w/ PMHx of HTN, HLD, DM type II, HIV/AIDS (CD4 380, VL <20), CAD (s/p multiple stent placement), Asthma, Gout, and depression, presents to the ED w/ complaints of chest pain and SOB. The patient claims these symptoms started 3-4 days ago, mostly w/ chest pain, that he describes as sharp in nature, 8/10 in severity, left-sided, radiating into the left arm. He claims the cough has been very severe, dry in nature, and makes his SOB worse. He denies fever, chills, nausea, or vomiting. The patient has a h/o asthma, but states that it is rarely ever an issue for him, uses his rescue inhaler very infrequently.  The patient has an extensive h/o CAD, w/ multiple previous stents to his LAD, most recently 10/2013, where he had previous stents re-stented in the setting of in-stent stenosis. The patient claims since his most recent cardiac cath, he felt well for 2-4 weeks, and then began having chest pain w/ exertion and associated SOB. Prior to this admission, patient received ASA 325 + NTG x3 via EMS, in which he says his pain was decreased from 8/10-6/10. The patient denies any dizziness, lightheadedness, palpitations, LE swelling, PND, or orthopnea.   Hospital Course by problem list:   1. Mild Asthma Exacerbation- Patient complained of chest pain and tightness, SOB, cough and wheezing for 3-4 days prior to admission. Patient claims he has a h/o Asthma, but has not had issues w/ this recently. Uses an Albuterol rescue inhaler at home. On admission, patient noted to have significant wheezing diffusely, but w/ adequate air entry. Able to  speak in short sentences, SpO2 100% on room air. CXR findings showed NACPD.  ABG performed, pH 7.41, pCO2 35, pO2 108, HCO3 22, SpO2 98. Patient slightly tachycardic initially on exam. Given clinical presentation, significant for mild asthma exacerbation. Started on Duonebs scheduled q2h, Solumedrol 80 mg IV bid, Magnesium Sulfate 2 g IV, O2 via [employer], Robitusssin-DM for cough + Hycodan, and Morphine for pain. Also held home beta-blocker (Toprol XL). By 01/28/14, pulmonary exam improved significantly, decreased wheezes, changed Duonebs to q4h scheduled and changed to Prednisone 40 mg po qd. Peak flows progressively improved throughout admission. On the day of discharge, patient tolerating room air, SpO2 95-100% w/ ambulation. Still appeared somewhat tachypneic on exam, still w/ cough. Patient expressed that he felt a bit anxious, especially about going home. Given Ativan 0.5 mg po w/ some relief of anxiety and decrease in tachypnea. Discharged to [facility] w/ appropriate medications w/ nebulizer machine and Prednisone taper; 20 mg po qd for 7 days + 10 mg po qd for 7 days.   2. CAD- Reported chest pain w/ exertion for the past 2-3 months. Patient w/ multiple previous stents. Per records, he was hospitalized at [facility] in 06/2013 where he presented with chest pain after multiple days of drug use and had an anterior STEMI. Received a BMS to his proximal and distal LAD at that time. In 10/2013, had a repeat catheterization, found to have 98% in-stent stenoses of proximal and mid LAD stents. The previously placed BMS with ISR were successfully treated with PCI x2. Placed on dual anti-platelet therapy with ASA + Plavix at that time. UDS negative during this admission, however, patient does admit to crack-cocaine use as recent as the end of March. Continued on ASA + Plavix + Cardizem, held Toprol XL + ACEI. Given chest pain and h/o previous MI, patient went for Myoview on 01/28/14, which was interpreted as an  intermediate risk study. Patient had evidence of a previous medium-sized distal anterior wall MI with a very small amount of peri-infarct ischemia. His overall left systolic function was well-preserved. The distal anterior wall and the apex seemed to contract fairly well. EF of 60%. Cardiology continued to follow, felt that chest pain was unlikely to be cardiac and Myoview results were not concerning at this time. Restarted Toprol XL and ACEI on discharge as respiratory symptoms had resolved. If patient w/ continued cocaine use, should completely discontinue beta-blocker on follow up.   3. HIV/AIDS- Most recent CD4 380, VL <20, as of 10/2013. On Truvada + Edurant as an outpatient. Rechecked CD4 and VL during admission, found to be 270 and 233, respectively. Will need [facility] follow up on discharge for Hepatitis vaccinations + HIV management.   4. Depression/anxiety- An ongoing issue for Mr. Ferreras. On Cymbalta 60 mg po qd at home. States that he has been depressed lately, also w/ anxiety that is likely contributing to his respiratory status. Patient to see [facility] on discharge. Given Ativan 0.5 mg po #5 for a trial to assess if anxiety is associated w/ his breathing issues.   Discharge Vitals:   BP 133/80  Pulse 82  Temp(Src) 98 F (36.7 C) (Oral)  Resp 20  Ht 5' 7.5" (1.715 m)  Wt 183 lb 10.3 oz (83.3 kg)  BMI 28.32 kg/m2  SpO2 100%  Discharge Labs:  No results found for this or any previous visit (from the past 24 hour(s)).  Signed: Corky Sox, MD 02/03/2014, 8:41 AM   Time Spent on Discharge: 35 minutes Services Ordered on Discharge: none Equipment Ordered on Discharge: none

## 2014-01-31 NOTE — Progress Notes (Signed)
Patient Saturations on Room Air at Rest =97%  Patient Saturations on [employer] while Ambulating = 97%   Carollee Sires, RN

## 2014-01-31 NOTE — Progress Notes (Signed)
CARE MANAGEMENT NOTE 01/31/2014  Patient:  Michael [name], Michael [name]   Account Number:  [id number]  Date Initiated:  01/28/2014  Documentation initiated by:  Elissa Hefty  Subjective/Objective Assessment:   adm w asthma and ch pain     Action/Plan:   chart states from grp home, pcp dr Fransisca Kaufmann   Anticipated DC Date:  01/31/2014   Anticipated DC Plan:  HOME/SELF CARE  In-house referral  Clinical Social Worker      DC Planning Services  CM consult      Choice offered to / List presented to:     DME arranged  [employer]      DME agency  [facility].        Status of service:  Completed, signed off Medicare Important Message given?   (If response is "NO", the following Medicare IM given date fields will be blank) Date Medicare IM given:   Date Additional Medicare IM given:    Discharge Disposition:  HOME/SELF CARE  Per UR Regulation:  Reviewed for med. necessity/level of care/duration of stay  If discussed at [place] of Stay Meetings, dates discussed:    Comments:  01/31/2014 1620  NCM spoke to pt and states he is currently living at [facility]. He receives a SS check each month that he feels he can afford an extended stay room at a hotel or his apt with [employer]. Provided pt with phone number for [employer] and extended stay hotels in [place]. States he could stay with his son but has not asked his son at this time. Pt's brother will pick him up today at dc. He has money to pay for his medications. Jonnie Finner RN CCM Case Mgmt phone [phone]

## 2014-01-31 NOTE — Progress Notes (Signed)
Subjective: Patient seen at bedside this AM. Feeling much better, getting breathing treatment. Still with wheezes diffusely, but much improved. Claims his coughing has also improved, however, still coughing on exam.   Objective: Vital signs in last 24 hours: Filed Vitals:   01/30/14 1431 01/30/14 2117 01/31/14 0500 01/31/14 0812  BP: 133/68 131/84 120/83   Pulse: 94 98 80   Temp: 97 F (36.1 C) 97.8 F (36.6 C) 97.5 F (36.4 C)   TempSrc: Oral Oral Oral   Resp: 17 18 18    Height:      Weight:      SpO2: 97% 96% 100% 99%   Weight change:   Intake/Output Summary (Last 24 hours) at 01/31/14 0842 Last data filed at 01/30/14 1756  Gross per 24 hour  Intake    720 ml  Output      0 ml  Net    720 ml   Physical Exam: General: Alert, cooperative, NAD.  HEENT: EOMI, PERRL.  Lungs: Air entry adequate and equal bilaterally. Diffuse end-expiratory wheezes, no crackles or rales.  Heart: RRR. No murmurs, gallops, or rubs. Abdomen: Soft, non-tender, non-distended, BS + Extremities: Moving all 4 extremities, no LE edema. Neurologic: Alert & oriented X3, CN's generally intact, no focal sensory or motor abnormalities.  Lab Results: Basic Metabolic Panel:  Recent Labs Lab 01/28/14 0317 01/29/14 0538  NA 136* 140  K 5.2 4.3  CL 101 107  CO2 18* 21  GLUCOSE 390* 213*  BUN 20 28*  CREATININE 1.21 1.25  CALCIUM 9.2 9.1   CBC:  Recent Labs Lab 01/27/14 1043 01/28/14 0317  WBC 3.3* 3.8*  NEUTROABS 1.9  --   HGB 10.0* 9.8*  HCT 30.4* 29.2*  MCV 84.7 82.7  PLT 135* 140*   Cardiac Enzymes:  Recent Labs Lab 01/27/14 1043 01/27/14 1710 01/27/14 2302  TROPONINI <0.30 <0.30 <0.30   BNP:  Recent Labs Lab 01/27/14 1043  PROBNP 227.4*   CBG:  Recent Labs Lab 01/29/14 2111 01/30/14 0610 01/30/14 1122 01/30/14 1620 01/30/14 2115 01/31/14 0619  GLUCAP 214* 196* 163* 261* 257* 177*   Urine Drug Screen: Drugs of Abuse     Component Value Date/Time   LABOPIA NONE DETECTED 01/27/2014 1137   COCAINSCRNUR NONE DETECTED 01/27/2014 1137   LABBENZ NONE DETECTED 01/27/2014 1137   AMPHETMU NONE DETECTED 01/27/2014 1137   THCU NONE DETECTED 01/27/2014 1137   LABBARB NONE DETECTED 01/27/2014 1137    Micro Results: Recent Results (from the past 240 hour(s))  MRSA PCR SCREENING     Status: None   Collection Time    01/27/14  3:37 PM      Result Value Ref Range Status   MRSA by PCR NEGATIVE  NEGATIVE Final   Comment:            The GeneXpert MRSA Assay (FDA     approved for NASAL specimens     only), is one component of a     comprehensive MRSA colonization     surveillance program. It is not     intended to diagnose MRSA     infection nor to guide or     monitor treatment for     MRSA infections.   Medications: I have reviewed the patient's current medications. Scheduled Meds: . allopurinol  200 mg Oral q morning - 10a  . aspirin  81 mg Oral Daily  . atorvastatin  10 mg Oral q1800  . clopidogrel  75 mg Oral Q  breakfast  . diltiazem  120 mg Oral Daily  . DULoxetine  60 mg Oral Daily  . emtricitabine-tenofovir  1 tablet Oral Daily  . enoxaparin (LOVENOX) injection  40 mg Subcutaneous Q24H  . gabapentin  300 mg Oral TID  . HYDROcodone-homatropine  5 mL Oral 4 times per day  . insulin aspart  0-20 Units Subcutaneous TID WC  . insulin glargine  30 Units Subcutaneous Daily  . ipratropium-albuterol  3 mL Nebulization QID  . predniSONE  40 mg Oral Q breakfast  . rilpivirine  25 mg Oral Q breakfast  . sodium chloride  3 mL Intravenous Q12H   Continuous Infusions:   PRN Meds:.guaiFENesin-dextromethorphan, ipratropium-albuterol  Assessment/Plan: [name] is a 63 y.o. male w/ PMHx of HTN, HLD, DM type II, HIV/AIDS (CD4 380, VL <20), CAD (s/p multiple stent placement), Asthma, Gout, and depression, admitted for mild to moderate asthma exacerbation.   Asthma Exacerbation- Patient w/ SpO2 of 95-100% on RA, still with mild end  expiratory wheezes bilaterally. Cough still present but improved.  -Duonebs q4h scheduled with q2 prn -Continue Prednisone 40 mg po qd; will discharge w/ taper (20 po qd for 7 days, 10 po qd for 7 days). -O2 via [place] (2L) prn -Robitussin-DM prn for cough + Hycodan -Zofran prn nausea  -Continue incentive spirometry  CAD- Follows at [facility], recent re-stenting in Jan 2015.  On ASA and plavix and reports compliance but does admit to recurrent cocaine use as recently as the end of March. Troponins negative x3, s/p myoview 4/24; intermediate risk study, systolic function well preserved, EF 60%. Given preserved EF per nuclear scan and previous distal anterior infarction, cardiology signed off. -Continue ASA 81 + Plavix 75 mg po qd  -Lipitor 10 mg qhs  -Continue Cardizem CD 120 mg po qd -Continue to hold Toprol XL + ACEi for now. Patient normotensive. Can restart on discharge.  HIV/AIDS- CD4 270 as of 01/28/14. VL still pending. -Continue Truvada + Edurant  -Appreciate [name] following, will follow up as outpatient  DM type II- CBG's stable, HbA1c 7.5. -Continue Lantus to 30 units (home dose) -ISS-R + CBG's AC/HS   Depression -Continue Cymbalta 60 mg po qd -Will have patient f/u w/ [facility]  Cocaine dependence- Last use March 2015. -Continue to encourage cessation  DVT/PE PPx- Lovenox [place]  Diet: Carb modified  Dispo: Discharge today.  The patient does have a current PCP (Neema Bobbie Stack, MD) and does need an [facility] hospital follow-up appointment after discharge.  The patient does not have transportation limitations that hinder transportation to clinic appointments.  Services Needed at time of discharge: Y = Yes, Blank = No PT:   OT:   RN:   Equipment:   Other:     LOS: 4 days   Corky Sox, MD 01/31/2014, 8:42 AM

## 2014-01-31 NOTE — Progress Notes (Signed)
Pt's HR became elevated at 11am; sustaining 120s-130; called MD; MD ordered EKG; EKG completed; pt HR 91 on EKG.  Carollee Sires, RN

## 2014-01-31 NOTE — Progress Notes (Signed)
Pt transferred to 2W. Unit CSW provided handoff. This CSW signing off.   Ky Barban, MSW, [facility] Clinical Social Worker [phone]

## 2014-01-31 NOTE — Progress Notes (Signed)
DC IV and tele per MD orders and protocol; DC instructions reviewed and signed by pt; pt does not have any questions; paper prescriptions given to pt; pt is instructed to take medications as prescribed and follow up with MD's as advised on DC instructions.  Carollee Sires, RN

## 2014-01-31 NOTE — Progress Notes (Signed)
  Date: 01/31/2014  Patient name: Michael [name]  Medical record number: [id number]  Date of birth: [birth year]   This patient has been seen and the plan of care was discussed with the house staff. Please see their note for complete details. I concur with their findings with the following additions/corrections: Feel better. Coughing is improved. Continue slow prednisone taper. His O2 sats were 97% on RA on exam and he had minimal wheezing. Peak flows improving. Appreciate cardiology input. Continue ASA and plavix.  Medically stable for D/C.  Dominic Pea, DO, [facility] 01/31/2014, 11:46 AM

## 2014-01-31 NOTE — Progress Notes (Signed)
Clinical Social Work Department BRIEF PSYCHOSOCIAL ASSESSMENT 01/31/2014  Patient:  Michael [name], Michael [name]     Account Number:  [id number]     [place] date:  01/27/2014  Clinical Social Worker:  Megan Salon  Date/Time:  01/31/2014 03:55 PM  Referred by:  RN  Date Referred:  01/31/2014 Referred for  Homelessness   Other Referral:   Interview type:  Patient Other interview type:    PSYCHOSOCIAL DATA Living Status:  FACILITY Admitted from facility:  [facility] Level of care:   Primary support name:  Michael [name] Primary support relationship to patient:  CHILD, ADULT Degree of support available:   Good    CURRENT CONCERNS Current Concerns  Post-Acute Placement   Other Concerns:    SOCIAL WORK ASSESSMENT / PLAN Per RN, patient is from [facility] and is planning to go back there. CSW went into room and met with patient to confirm patient's dc plan. Patient was very pleasant and states that his son is coming to pick him up and bring him back to [facility]. Patient states he called [facility] and they have a bed ready for him. Patient states he is going to try and find a motel and then find a nice apartment for him. Patient thanked Education officer, museum for visit.   Assessment/plan status:  Psychosocial Support/Ongoing Assessment of Needs Other assessment/ plan:   Information/referral to community resources:   Bus pass offered, patient declined resources    PATIENT'S/FAMILY'S RESPONSE TO PLAN OF CARE: Patient is going back to [name] and states his son is taking him. CSW signing off at this time.        Jeanette Caprice, MSW, [place]

## 2014-02-01 ENCOUNTER — Telehealth: Payer: Self-pay | Admitting: *Deleted

## 2014-02-01 DIAGNOSIS — B20 Human immunodeficiency virus [HIV] disease: Secondary | ICD-10-CM

## 2014-02-01 MED ORDER — EMTRICITAB-RILPIVIR-TENOFOV DF 200-25-300 MG PO TABS: 1.0000 | ORAL_TABLET | Freq: Every day | ORAL | Status: DC

## 2014-02-03 ENCOUNTER — Ambulatory Visit (INDEPENDENT_AMBULATORY_CARE_PROVIDER_SITE_OTHER): Payer: Medicare Other | Admitting: Infectious Diseases

## 2014-02-03 ENCOUNTER — Encounter: Payer: Self-pay | Admitting: Infectious Diseases

## 2014-02-03 VITALS — BP 125/78 | HR 96 | Temp 97.9°F | Wt 183.0 lb

## 2014-02-03 DIAGNOSIS — B20 Human immunodeficiency virus [HIV] disease: Secondary | ICD-10-CM

## 2014-02-03 DIAGNOSIS — J45909 Unspecified asthma, uncomplicated: Secondary | ICD-10-CM | POA: Insufficient documentation

## 2014-02-03 DIAGNOSIS — I2510 Atherosclerotic heart disease of native coronary artery without angina pectoris: Secondary | ICD-10-CM

## 2014-02-03 DIAGNOSIS — Z21 Asymptomatic human immunodeficiency virus [HIV] infection status: Secondary | ICD-10-CM

## 2014-02-03 MED ORDER — PREDNISONE 20 MG PO TABS: 20.0000 mg | ORAL_TABLET | Freq: Every day | ORAL | Status: DC

## 2014-02-03 MED ORDER — PREDNISONE 10 MG PO TABS: 10.0000 mg | ORAL_TABLET | Freq: Every day | ORAL | Status: DC

## 2014-02-03 NOTE — Assessment & Plan Note (Signed)
He has picked up his rx's. Will call pharmacy and verify this. He has IM f/u on 5-4.  My great appreciation to the expertise of Dr Donneta Romberg.

## 2014-02-03 NOTE — Discharge Summary (Signed)
  Date: 02/03/2014  Patient name: Michael [name]  Medical record number: [id number]  Date of birth: 16-Jan-1951   This patient has been seen and the plan of care was discussed with the house staff. Please see their note for complete details. I concur with their findings and plan.  Dominic Pea, DO, [facility] 02/03/2014, 1:57 PM

## 2014-02-03 NOTE — Assessment & Plan Note (Signed)
He had intermediate risk myoview at his last hospitalization. He was eval by CV. My great appreciation to IM for f/u.

## 2014-02-03 NOTE — Progress Notes (Signed)
   Subjective:    Patient ID: Michael [name], male    DOB: [birth year], 63 y.o.   MRN: [id number]  HPI 63 yo M with hx of DM2, HIV+ (previously on Complera), recent re-stent to LAD January 2015 after STEMI (06-2013). Admitted 4-23 with 2 months of DOE and chest pain. He described the pain as related to deep breathing. In hospital he has had troponins (-). His breathing is also felt to have a component of asthma exacerbation. He was d/c home on 4-27 with slow prednisone taper.   Today feels the same as he did when he went to the hospital  Needs to pick up prednisone rx from his [facility].  Sill having prod cough. Felt like his heart started racing today when he went to pharmacy to pick up his meds (which he didn't) due to road construction dust. Did not have his meds for nebulize yet.   HIV 1 RNA Quant (copies/mL)  Date Value  01/28/2014 233*  11/02/2013 <20   04/07/2013 <20      CD4 T Cell Abs (/uL)  Date Value  01/28/2014 270*  11/02/2013 380*  04/07/2013 450     Review of Systems  Constitutional: Positive for appetite change. Negative for unexpected weight change.  Respiratory: Positive for cough and shortness of breath.   Cardiovascular: Negative for leg swelling.  Gastrointestinal: Positive for diarrhea. Negative for constipation.  Genitourinary: Negative for difficulty urinating.       Objective:   Physical Exam  Constitutional: He appears well-developed and well-nourished.  HENT:  Mouth/Throat: No oropharyngeal exudate.  Eyes: EOM are normal. Pupils are equal, round, and reactive to light.  Neck: Neck supple.  Cardiovascular: Normal rate.   Pulmonary/Chest: Effort normal and breath sounds normal.  Abdominal: Soft. Bowel sounds are normal. There is no tenderness. There is no rebound.  Musculoskeletal: He exhibits no edema.  Lymphadenopathy:    He has no cervical adenopathy.          Assessment & Plan:

## 2014-02-03 NOTE — Assessment & Plan Note (Signed)
Has been doing well, had low level detectable in hospital. Will recheck this today.

## 2014-02-04 LAB — HIV-1 RNA ULTRAQUANT REFLEX TO GENTYP+

## 2014-02-07 ENCOUNTER — Encounter: Payer: Self-pay | Admitting: Internal Medicine

## 2014-02-07 ENCOUNTER — Ambulatory Visit: Payer: Self-pay | Admitting: Internal Medicine

## 2014-02-18 ENCOUNTER — Other Ambulatory Visit: Payer: Self-pay | Admitting: Internal Medicine

## 2014-02-23 ENCOUNTER — Emergency Department (HOSPITAL_COMMUNITY)
Admission: EM | Admit: 2014-02-23 | Discharge: 2014-02-24 | Disposition: A | Discharge status: Home / Self Care | Payer: Medicare Other | Attending: Emergency Medicine | Admitting: Emergency Medicine

## 2014-02-23 ENCOUNTER — Encounter (HOSPITAL_COMMUNITY): Payer: Self-pay | Admitting: Emergency Medicine

## 2014-02-23 ENCOUNTER — Emergency Department (HOSPITAL_COMMUNITY): Payer: Medicare Other

## 2014-02-23 DIAGNOSIS — Z8739 Personal history of other diseases of the musculoskeletal system and connective tissue: Secondary | ICD-10-CM | POA: Insufficient documentation

## 2014-02-23 DIAGNOSIS — Z794 Long term (current) use of insulin: Secondary | ICD-10-CM | POA: Insufficient documentation

## 2014-02-23 DIAGNOSIS — J45901 Unspecified asthma with (acute) exacerbation: Secondary | ICD-10-CM | POA: Insufficient documentation

## 2014-02-23 DIAGNOSIS — F3289 Other specified depressive episodes: Secondary | ICD-10-CM | POA: Insufficient documentation

## 2014-02-23 DIAGNOSIS — Z7982 Long term (current) use of aspirin: Secondary | ICD-10-CM | POA: Insufficient documentation

## 2014-02-23 DIAGNOSIS — Z21 Asymptomatic human immunodeficiency virus [HIV] infection status: Secondary | ICD-10-CM | POA: Insufficient documentation

## 2014-02-23 DIAGNOSIS — E119 Type 2 diabetes mellitus without complications: Secondary | ICD-10-CM | POA: Insufficient documentation

## 2014-02-23 DIAGNOSIS — I10 Essential (primary) hypertension: Secondary | ICD-10-CM | POA: Insufficient documentation

## 2014-02-23 DIAGNOSIS — Z79899 Other long term (current) drug therapy: Secondary | ICD-10-CM | POA: Insufficient documentation

## 2014-02-23 DIAGNOSIS — M109 Gout, unspecified: Secondary | ICD-10-CM | POA: Insufficient documentation

## 2014-02-23 DIAGNOSIS — R079 Chest pain, unspecified: Secondary | ICD-10-CM

## 2014-02-23 DIAGNOSIS — Z7902 Long term (current) use of antithrombotics/antiplatelets: Secondary | ICD-10-CM | POA: Insufficient documentation

## 2014-02-23 DIAGNOSIS — E785 Hyperlipidemia, unspecified: Secondary | ICD-10-CM | POA: Insufficient documentation

## 2014-02-23 DIAGNOSIS — J45909 Unspecified asthma, uncomplicated: Secondary | ICD-10-CM

## 2014-02-23 DIAGNOSIS — I2510 Atherosclerotic heart disease of native coronary artery without angina pectoris: Secondary | ICD-10-CM

## 2014-02-23 DIAGNOSIS — F329 Major depressive disorder, single episode, unspecified: Secondary | ICD-10-CM | POA: Insufficient documentation

## 2014-02-23 LAB — CBC
HCT: 35.5 % — ABNORMAL LOW (ref 39.0–52.0)
Hemoglobin: 11.6 g/dL — ABNORMAL LOW (ref 13.0–17.0)
MCH: 27.6 pg (ref 26.0–34.0)
MCHC: 32.7 g/dL (ref 30.0–36.0)
MCV: 84.5 fL (ref 78.0–100.0)
PLATELETS: 162 10*3/uL (ref 150–400)
RBC: 4.2 MIL/uL — AB (ref 4.22–5.81)
RDW: 13.4 % (ref 11.5–15.5)
WBC: 3.3 10*3/uL — AB (ref 4.0–10.5)

## 2014-02-23 LAB — BASIC METABOLIC PANEL
BUN: 16 mg/dL (ref 6–23)
CALCIUM: 9.9 mg/dL (ref 8.4–10.5)
CHLORIDE: 102 meq/L (ref 96–112)
CO2: 24 meq/L (ref 19–32)
CREATININE: 1.45 mg/dL — AB (ref 0.50–1.35)
GFR calc non Af Amer: 50 mL/min — ABNORMAL LOW (ref >90)
GFR, EST AFRICAN AMERICAN: 58 mL/min — AB (ref >90)
Glucose, Bld: 209 mg/dL — ABNORMAL HIGH (ref 70–99)
Potassium: 4.7 mEq/L (ref 3.7–5.3)
SODIUM: 140 meq/L (ref 137–147)

## 2014-02-23 LAB — I-STAT TROPONIN, ED: Troponin i, poc: 0 ng/mL (ref 0.00–0.08)

## 2014-02-23 LAB — PRO B NATRIURETIC PEPTIDE: PRO B NATRI PEPTIDE: 146.4 pg/mL — AB (ref 0–125)

## 2014-02-23 LAB — CBG MONITORING, ED: GLUCOSE-CAPILLARY: 190 mg/dL — AB (ref 70–99)

## 2014-02-23 MED ORDER — ALBUTEROL SULFATE (2.5 MG/3ML) 0.083% IN NEBU
5.0000 mg | INHALATION_SOLUTION | Freq: Once | RESPIRATORY_TRACT | Status: AC
  Administered 2014-02-23: 5 mg via RESPIRATORY_TRACT

## 2014-02-23 MED ORDER — IPRATROPIUM BROMIDE 0.02 % IN SOLN
0.5000 mg | Freq: Once | RESPIRATORY_TRACT | Status: AC
  Administered 2014-02-24: 0.5 mg via RESPIRATORY_TRACT
  Filled 2014-02-23: qty 2.5

## 2014-02-23 MED ORDER — ALBUTEROL SULFATE (2.5 MG/3ML) 0.083% IN NEBU
2.5000 mg | INHALATION_SOLUTION | Freq: Once | RESPIRATORY_TRACT | Status: DC
  Filled 2014-02-23: qty 3

## 2014-02-23 MED ORDER — ALBUTEROL SULFATE (2.5 MG/3ML) 0.083% IN NEBU
5.0000 mg | INHALATION_SOLUTION | Freq: Once | RESPIRATORY_TRACT | Status: AC
  Administered 2014-02-24: 5 mg via RESPIRATORY_TRACT
  Filled 2014-02-23: qty 6

## 2014-02-23 NOTE — ED Notes (Signed)
Pt presents with intermittent central chest tightness x2 weeks, pt seen here 2 weeks ago for the same and hospitalized for 5 days. Pt also reports SOB with a hx of asthma and feels his symptoms today are related to his asthma. Pt used his inhaler with no relief

## 2014-02-23 NOTE — ED Provider Notes (Signed)
CSN: [id number]     Arrival date & time 02/23/14  1948 History   First MD Initiated Contact with Patient 02/23/14 2301     Chief Complaint  Patient presents with  . Chest Pain  . Asthma     (Consider location/radiation/quality/duration/timing/severity/associated sxs/prior Treatment) The history is provided by the patient and medical records. No language interpreter was used.    Michael [name] is a 63 y.o. male  with a hx of IDDM, asthma, HTN, headache, gout, HIV ([place]), crack cocaine use, depression, CAD presents to the Emergency Department complaining of gradual, persistent, progressively worsening chest pain and SOB onset 3 days ago. Pt reports he was hospitalized 2 weeks for 5 days with a severe asthma exacerbation.  He reports things were getting better but 4 days ago he began to have symptoms again. Pt reports that yesterday after using the inhaler he felt dizzy like he was "high" and it resolved spontaneously.  He reports he used it again today at 4pm and his mouth felt numb which lasted 45 min before resolving.  Pt reports his chest pain and SOB is much worse with coughing and deep breathing.  He is using the duoneb at home.  He reports yesterday he has been using the medication for the last 2 weeks and it has not made him feel this way before.  NO other aggravating or alleviating factors.  Pt denies fever, chills, headache, neck pain, abd pain, N/V/D, weakness, dizziness, syncope, dysuria.    Pt reports his breathing is best in the cool mornings and worsens thoughout the day as the temperature rises.     Past Medical History  Diagnosis Date  . Diabetes mellitus     Diagnosed in 2002, started insulin in 2012  . Hypertension   . Headache(784.0)     CT head 08/2011: Periventricular and subcortical white matter hypodensities are most in keeping with chronic microangiopathic change  . Gout   . Hyperlipidemia   . HIV infection Nov 2012    Followed by [name]  . Crack cocaine  use     for 20+ years, has been enrolled in detox programs in the past  . Chondromalacia of medial femoral condyle     Left knee MRI 04/28/12: Chondromalacia of the medial femoral condyle with slight peripheral degeneration of the meniscocapsular junction of the medial meniscus; followed by sports medicine  . Asthma     No PFTs, history of childhood asthma  . Depression     with history of hospitalization for suicidal ideation  . CAD (coronary artery disease) 06/10/2013    Presented as STEMI 06/10/13; s/p BMS to LAD; done at [facility] (LAD prox 95% with thrombus, mid 75%, D2 75%; LCX OM2 75%; RCA small, PDA 95%, PLV 95%)  . Gout 04/28/2012   Past Surgical History  Procedure Laterality Date  . Prostate surgery    . Bowel resection    . Back surgery      1988   Family History  Problem Relation Age of Onset  . Diabetes Mother   . Diabetes Father   . Diabetes Brother   . Colon cancer Neg Hx    History  Substance Use Topics  . Smoking status: Never Smoker   . Smokeless tobacco: Never Used  . Alcohol Use: No    Review of Systems  Constitutional: Negative for fever, diaphoresis, appetite change, fatigue and unexpected weight change.  HENT: Negative for mouth sores.   Eyes: Negative for visual disturbance.  Respiratory: Positive for cough and chest tightness. Negative for shortness of breath and wheezing.   Cardiovascular: Positive for chest pain.  Gastrointestinal: Negative for nausea, vomiting, abdominal pain, diarrhea and constipation.  Endocrine: Negative for polydipsia, polyphagia and polyuria.  Genitourinary: Negative for dysuria, urgency, frequency and hematuria.  Musculoskeletal: Negative for back pain and neck stiffness.  Skin: Negative for rash.  Allergic/Immunologic: Negative for immunocompromised state.  Neurological: Negative for syncope, light-headedness and headaches.  Hematological: Does not bruise/bleed easily.  Psychiatric/Behavioral: Negative for sleep  disturbance. The patient is not nervous/anxious.       Allergies  Review of patient's allergies indicates no known allergies.  Home Medications   Prior to Admission medications   Medication Sig Start Date End Date Taking? Authorizing Provider  allopurinol (ZYLOPRIM) 100 MG tablet Take 2 tablets (200 mg total) by mouth every morning. For gout. 09/15/13  Yes Neema Bobbie Stack, MD  amantadine (SYMMETREL) 100 MG capsule Take 100 mg by mouth daily.  09/22/12  Yes Historical Provider, MD  aspirin 81 MG chewable tablet Chew 81 mg by mouth daily. For blood thinner.   Yes Historical Provider, MD  clopidogrel (PLAVIX) 75 MG tablet Take 75 mg by mouth daily with breakfast.  06/13/13 06/13/14 Yes Historical Provider, MD  diclofenac sodium (VOLTAREN) 1 % GEL Apply 2 g topically 2 (two) times daily as needed (pain).   Yes Historical Provider, MD  diltiazem (CARDIZEM CD) 120 MG 24 hr capsule Take 120 mg by mouth daily. Take 1 capsule (120 mg total) by mouth daily. 06/13/13  Yes Historical Provider, MD  DULoxetine (CYMBALTA) 60 MG capsule Take 1 capsule (60 mg total) by mouth daily.   Yes Othella Boyer, MD  Emtricitab-Rilpivir-Tenofovir (COMPLERA) 200-25-300 MG TABS Take 1 tablet by mouth daily. 02/01/14  Yes [name], MD  gabapentin (NEURONTIN) 300 MG capsule Take 300 mg by mouth 3 (three) times daily.   Yes Historical Provider, MD  HYDROcodone-homatropine (HYCODAN) 5-1.5 MG/5ML syrup Take 5 mLs by mouth every 6 (six) hours as needed for cough.   Yes Historical Provider, MD  insulin glargine (LANTUS) 100 UNIT/ML injection Inject 30 units daily before bed 06/24/13 06/24/14 Yes Neema K Sharda, MD  ipratropium-albuterol (DUONEB) 0.5-2.5 (3) MG/3ML SOLN Take 3 mLs by nebulization every 2 (two) hours as needed (shortness of breath).   Yes Historical Provider, MD  lisinopril (PRINIVIL,ZESTRIL) 5 MG tablet Take 1 tablet (5 mg total) by mouth daily. 08/06/13 08/06/14 Yes Neema Bobbie Stack, MD  LORazepam (ATIVAN) 0.5 MG  tablet Take 0.5 mg by mouth every 8 (eight) hours as needed for anxiety.   Yes Historical Provider, MD  metFORMIN (GLUCOPHAGE) 1000 MG tablet Take 1 tablet (1,000 mg total) by mouth 2 (two) times daily with a meal. 05/12/13  Yes Neema Bobbie Stack, MD  metoprolol succinate (TOPROL-XL) 25 MG 24 hr tablet Take 25 mg by mouth daily.   Yes Historical Provider, MD  nitroGLYCERIN (NITROSTAT) 0.4 MG SL tablet 0.4 mg. Place 1 tablet (0.4 mg total) under the tongue every 5 (five) minutes as needed for Chest pain. 06/13/13 06/13/14 Yes Historical Provider, MD  pravastatin (PRAVACHOL) 40 MG tablet Take 40 mg by mouth every evening.   Yes Historical Provider, MD  traMADol (ULTRAM) 50 MG tablet Take 1 tablet (50 mg total) by mouth every 6 (six) hours as needed for pain. 06/24/13  Yes Neema K Burnard Bunting, MD   BP 106/63  Pulse 87  Temp(Src) 98.1 F (36.7 C) (Oral)  Resp 21  SpO2 100% Physical Exam  Nursing note and vitals reviewed. Constitutional: He is oriented to person, place, and time. He appears well-developed and well-nourished. No distress.  Awake, alert, nontoxic appearance  HENT:  Head: Normocephalic and atraumatic.  Right Ear: Tympanic membrane, external ear and ear canal normal.  Left Ear: Tympanic membrane, external ear and ear canal normal.  Nose: Nose normal. No epistaxis. Right sinus exhibits no maxillary sinus tenderness and no frontal sinus tenderness. Left sinus exhibits no maxillary sinus tenderness and no frontal sinus tenderness.  Mouth/Throat: Uvula is midline, oropharynx is clear and moist and mucous membranes are normal. Mucous membranes are not pale and not cyanotic. No oropharyngeal exudate, posterior oropharyngeal edema, posterior oropharyngeal erythema or tonsillar abscesses.  Eyes: Conjunctivae are normal. Pupils are equal, round, and reactive to light. No scleral icterus.  Neck: Normal range of motion and full passive range of motion without pain. Neck supple.  Cardiovascular: Normal rate,  regular rhythm and intact distal pulses.   Pulmonary/Chest: Effort normal. No accessory muscle usage or stridor. Not tachypneic. No respiratory distress. He has decreased breath sounds (throughout). He has no wheezes. He has no rhonchi. He has no rales. He exhibits no tenderness and no bony tenderness.  Abdominal: Soft. Bowel sounds are normal. He exhibits no mass. There is no tenderness. There is no rebound and no guarding.  Musculoskeletal: Normal range of motion. He exhibits no edema.  Lymphadenopathy:    He has no cervical adenopathy.  Neurological: He is alert and oriented to person, place, and time. He exhibits normal muscle tone. Coordination normal.  Speech is clear and goal oriented Moves extremities without ataxia  Skin: Skin is warm and dry. No rash noted. He is not diaphoretic. No erythema.  Psychiatric: He has a normal mood and affect.    ED Course  Procedures (including critical care time) Labs Review Labs Reviewed  CBC - Abnormal; Notable for the following:    WBC 3.3 (*)    RBC 4.20 (*)    Hemoglobin 11.6 (*)    HCT 35.5 (*)    All other components within normal limits  BASIC METABOLIC PANEL - Abnormal; Notable for the following:    Glucose, Bld 209 (*)    Creatinine, Ser 1.45 (*)    GFR calc non Af Amer 50 (*)    GFR calc Af Amer 58 (*)    All other components within normal limits  PRO B NATRIURETIC PEPTIDE - Abnormal; Notable for the following:    Pro B Natriuretic peptide (BNP) 146.4 (*)    All other components within normal limits  CBG MONITORING, ED - Abnormal; Notable for the following:    Glucose-Capillary 190 (*)    All other components within normal limits  I-STAT TROPOININ, ED  Randolm Idol, ED    Imaging Review Dg Chest 2 View  02/23/2014   CLINICAL DATA:  Chest pain.  EXAM: CHEST  2 VIEW  COMPARISON:  Chest x-ray 01/27/2014.  FINDINGS: Lung volumes are normal. No consolidative airspace disease. No pleural effusions. No pneumothorax. No  pulmonary nodule or mass noted. Pulmonary vasculature and the cardiomediastinal silhouette are within normal limits. Atherosclerosis in the thoracic aorta. Coronary artery stent noted, likely in the LAD territory.  IMPRESSION: 1. No radiographic evidence of acute cardiopulmonary disease. 2. Atherosclerosis.   Electronically Signed   By: Vinnie Langton M.D.   On: 02/23/2014 21:11     EKG Interpretation   Date/Time:  Thursday Feb 24 2014 00:10:12 EDT Ventricular Rate:  92 PR Interval:  135 QRS Duration: 73 QT Interval:  344 QTC Calculation: 425 R Axis:   75 Text Interpretation:  Sinus rhythm Atrial premature complexes No  significant change since last tracing Confirmed by GOLDSTON  MD, SCOTT  ([id number]) on 02/24/2014 12:15:57 AM      MDM   Final diagnoses:  Chest pain  Asthma, chronic  3-vessel CAD    [name] presents with CP and SOB worsening in the last 3 days.  Pt reports the tightness began after his d/c from the hospital for and asthma exacerbation and it feels the same as the last exacerbation.  Pt also reports that this pain is worse with coughing.  On exam patient with slightly diminished lung sounds but no focal wheezes, rhonchi or rails. Will give albuterol treatment and reassess. During this time we will repeat his EKG and troponin.  1:56 AM Repeat ECG unchanged and without acute ischemia.  Repeat Troponin remains negative.  Pt reports decreased chest tightness after albuterol.  Patient reports he is feeling better. We'll discharge home with cough medication.  Chest pain is not likely of cardiac or pulmonary etiology d/t presentation, PERC negative, VSS, no tracheal deviation, no JVD or new murmur, RRR, breath sounds equal bilaterally, EKG without acute abnormalities, negative troponin, and negative CXR. Pt has been given a cough suppressant advised to return to the ED is CP becomes exertional, associated with diaphoresis or nausea, radiates to left jaw/arm, worsens or  becomes concerning in any way. Pt appears reliable for follow up and is agreeable to discharge.  Patient is to be discharged with recommendation to follow up with PCP in 2 days in regards to today's hospital visit.   BP 106/63  Pulse 87  Temp(Src) 98.1 F (36.7 C) (Oral)  Resp 21  SpO2 100%  Case has been discussed with and seen by Dr. Regenia Skeeter who agrees with the above plan to discharge.    Jarrett Soho Sy Saintjean, PA-C 02/24/14 0201

## 2014-02-23 NOTE — ED Notes (Signed)
Patient called x 2 with no answer 

## 2014-02-24 LAB — I-STAT TROPONIN, ED: Troponin i, poc: 0 ng/mL (ref 0.00–0.08)

## 2014-02-24 MED ORDER — BENZONATATE 100 MG PO CAPS: 200.0000 mg | ORAL_CAPSULE | Freq: Two times a day (BID) | ORAL | Status: DC | PRN

## 2014-02-24 MED FILL — Hepatitis B Vaccine (Recombinant) Susp 10 MCG/ML: INTRAMUSCULAR | Qty: 1 | Status: AC

## 2014-02-24 NOTE — Discharge Instructions (Signed)
1. Medications: tessalon, usual home medications 2. Treatment: rest, drink plenty of fluids,  3. Follow Up: Please followup with your primary doctor in 2 days for discussion of your diagnoses and further evaluation after today's visit;  Cough, Adult  A cough is a reflex that helps clear your throat and airways. It can help heal the body or may be a reaction to an irritated airway. A cough may only last 2 or 3 weeks (acute) or may last more than 8 weeks (chronic).  CAUSES Acute cough:  Viral or bacterial infections. Chronic cough:  Infections.  Allergies.  Asthma.  Post-nasal drip.  Smoking.  Heartburn or acid reflux.  Some medicines.  Chronic lung problems (COPD).  Cancer. SYMPTOMS   Cough.  Fever.  Chest pain.  Increased breathing rate.  High-pitched whistling sound when breathing (wheezing).  Colored mucus that you cough up (sputum). TREATMENT   A bacterial cough may be treated with antibiotic medicine.  A viral cough must run its course and will not respond to antibiotics.  Your caregiver may recommend other treatments if you have a chronic cough. HOME CARE INSTRUCTIONS   Only take over-the-counter or prescription medicines for pain, discomfort, or fever as directed by your caregiver. Use cough suppressants only as directed by your caregiver.  Use a cold steam vaporizer or humidifier in your bedroom or home to help loosen secretions.  Sleep in a semi-upright position if your cough is worse at night.  Rest as needed.  Stop smoking if you smoke. SEEK IMMEDIATE MEDICAL CARE IF:   You have pus in your sputum.  Your cough starts to worsen.  You cannot control your cough with suppressants and are losing sleep.  You begin coughing up blood.  You have difficulty breathing.  You develop pain which is getting worse or is uncontrolled with medicine.  You have a fever. MAKE SURE YOU:   Understand these instructions.  Will watch your  condition.  Will get help right away if you are not doing well or get worse. Document Released: 03/22/2011 Document Revised: 12/16/2011 Document Reviewed: 03/22/2011 [facility] Patient Information 2014 [name].   Asthma, Adult Asthma is a recurring condition in which the airways tighten and narrow. Asthma can make it difficult to breathe. It can cause coughing, wheezing, and shortness of breath. Asthma episodes (also called asthma attacks) range from minor to life-threatening. Asthma cannot be cured, but medicines and lifestyle changes can help control it. CAUSES Asthma is believed to be caused by inherited (genetic) and environmental factors, but its exact cause is unknown. Asthma may be triggered by allergens, lung infections, or irritants in the air. Asthma triggers are different for each person. Common triggers include:   Animal dander.  Dust mites.  Cockroaches.  Pollen from trees or grass.  Mold.  Smoke.  Air pollutants such as dust, household cleaners, hair sprays, aerosol sprays, paint fumes, strong chemicals, or strong odors.  Cold air, weather changes, and winds (which increase molds and pollens in the air).  Strong emotional expressions such as crying or laughing hard.  Stress.  Certain medicines (such as aspirin) or types of drugs (such as beta-blockers).  Sulfites in foods and drinks. Foods and drinks that may contain sulfites include dried fruit, potato chips, and sparkling grape juice.  Infections or inflammatory conditions such as the flu, a cold, or an inflammation of the nasal membranes (rhinitis).  Gastroesophageal reflux disease (GERD).  Exercise or strenuous activity. SYMPTOMS Symptoms may occur immediately after asthma is triggered  or many hours later. Symptoms include:  Wheezing.  Excessive nighttime or early morning coughing.  Frequent or severe coughing with a common cold.  Chest tightness.  Shortness of breath. DIAGNOSIS  The  diagnosis of asthma is made by a review of your medical history and a physical exam. Tests may also be performed. These may include:  Lung function studies. These tests show how much air you breath in and out.  Allergy tests.  Imaging tests such as X-rays. TREATMENT  Asthma cannot be cured, but it can usually be controlled. Treatment involves identifying and avoiding your asthma triggers. It also involves medicines. There are 2 classes of medicine used for asthma treatment:   Controller medicines. These prevent asthma symptoms from occurring. They are usually taken every day.  Reliever or rescue medicines. These quickly relieve asthma symptoms. They are used as needed and provide short-term relief. Your health care provider will help you create an asthma action plan. An asthma action plan is a written plan for managing and treating your asthma attacks. It includes a list of your asthma triggers and how they may be avoided. It also includes information on when medicines should be taken and when their dosage should be changed. An action plan may also involve the use of a device called a peak flow meter. A peak flow meter measures how well the lungs are working. It helps you monitor your condition. HOME CARE INSTRUCTIONS   Take medicine as directed by your health care provider. Speak with your health care provider if you have questions about how or when to take the medicines.  Use a peak flow meter as directed by your health care provider. Record and keep track of readings.  Understand and use the action plan to help minimize or stop an asthma attack without needing to seek medical care.  Control your home environment in the following ways to help prevent asthma attacks:  Do not smoke. Avoid being exposed to secondhand smoke.  Change your heating and air conditioning filter regularly.  Limit your use of fireplaces and wood stoves.  Get rid of pests (such as roaches and mice) and their  droppings.  Throw away plants if you see mold on them.  Clean your floors and dust regularly. Use unscented cleaning products.  Try to have someone else vacuum for you regularly. Stay out of rooms while they are being vacuumed and for a short while afterward. If you vacuum, use a dust mask from a hardware store, a double-layered or microfilter vacuum cleaner bag, or a vacuum cleaner with a HEPA filter.  Replace carpet with wood, tile, or vinyl flooring. Carpet can trap dander and dust.  Use allergy-proof pillows, mattress covers, and box spring covers.  Wash bed sheets and blankets every week in hot water and dry them in a dryer.  Use blankets that are made of polyester or cotton.  Clean bathrooms and kitchens with bleach. If possible, have someone repaint the walls in these rooms with mold-resistant paint. Keep out of the rooms that are being cleaned and painted.  Wash hands frequently. SEEK MEDICAL CARE IF:   You have wheezing, shortness of breath, or a cough even if taking medicine to prevent attacks.  The colored mucus you cough up (sputum) is thicker than usual.  Your sputum changes from clear or white to yellow, green, gray, or bloody.  You have any problems that may be related to the medicines you are taking (such as a rash, itching, swelling, or  trouble breathing).  You are using a reliever medicine more than 2 3 times per week.  Your peak flow is still at 50 79% of you personal best after following your action plan for 1 hour. SEEK IMMEDIATE MEDICAL CARE IF:   You seem to be getting worse and are unresponsive to treatment during an asthma attack.  You are short of breath even at rest.  You get short of breath when doing very little physical activity.  You have difficulty eating, drinking, or talking due to asthma symptoms.  You develop chest pain.  You develop a fast heartbeat.  You have a bluish color to your lips or fingernails.  You are lightheaded, dizzy,  or faint.  Your peak flow is less than 50% of your personal best.  You have a fever or persistent symptoms for more than 2 3 days.  You have a fever and symptoms suddenly get worse. MAKE SURE YOU:   Understand these instructions.  Will watch your condition.  Will get help right away if you are not doing well or get worse. Document Released: 09/23/2005 Document Revised: 05/26/2013 Document Reviewed: 04/22/2013 [facility] Patient Information 2014 [place], [place].

## 2014-02-26 ENCOUNTER — Other Ambulatory Visit: Payer: Self-pay | Admitting: Internal Medicine

## 2014-02-28 NOTE — ED Provider Notes (Signed)
Medical screening examination/treatment/procedure(s) were conducted as a shared visit with non-physician practitioner(s) and myself.  I personally evaluated the patient during the encounter.   EKG Interpretation   Date/Time:  Thursday Feb 24 2014 00:10:12 EDT Ventricular Rate:  92 PR Interval:  135 QRS Duration: 73 QT Interval:  344 QTC Calculation: 425 R Axis:   75 Text Interpretation:  Sinus rhythm Atrial premature complexes No  significant change since last tracing Confirmed by Twilla Khouri  MD, Camrynn Mcclintic  ([id number]) on 02/24/2014 12:15:57 AM      Patient's CP appears more MSK/chest wall with pain with coughing and URI symptoms. No PNA, normal troponins and EKGS. I have low suspicion this is ACS, PE or dissection. Will discharge with close PCP f/u  Ephraim Hamburger, MD 02/28/14 1537

## 2014-03-03 ENCOUNTER — Encounter: Payer: Self-pay | Admitting: Internal Medicine

## 2014-03-21 ENCOUNTER — Ambulatory Visit: Payer: Self-pay | Admitting: Infectious Diseases

## 2014-04-06 DIAGNOSIS — L0390 Cellulitis, unspecified: Secondary | ICD-10-CM

## 2014-04-06 HISTORY — DX: Cellulitis, unspecified: L03.90

## 2014-04-24 ENCOUNTER — Encounter (HOSPITAL_COMMUNITY): Payer: Self-pay | Admitting: Emergency Medicine

## 2014-04-24 ENCOUNTER — Emergency Department (HOSPITAL_COMMUNITY)
Admission: EM | Admit: 2014-04-24 | Discharge: 2014-04-24 | Disposition: A | Discharge status: Home / Self Care | Payer: Medicare Other | Attending: Emergency Medicine | Admitting: Emergency Medicine

## 2014-04-24 DIAGNOSIS — F3289 Other specified depressive episodes: Secondary | ICD-10-CM | POA: Insufficient documentation

## 2014-04-24 DIAGNOSIS — F1410 Cocaine abuse, uncomplicated: Secondary | ICD-10-CM | POA: Insufficient documentation

## 2014-04-24 DIAGNOSIS — Z8739 Personal history of other diseases of the musculoskeletal system and connective tissue: Secondary | ICD-10-CM | POA: Insufficient documentation

## 2014-04-24 DIAGNOSIS — E1140 Type 2 diabetes mellitus with diabetic neuropathy, unspecified: Secondary | ICD-10-CM

## 2014-04-24 DIAGNOSIS — E1165 Type 2 diabetes mellitus with hyperglycemia: Secondary | ICD-10-CM

## 2014-04-24 DIAGNOSIS — Z7982 Long term (current) use of aspirin: Secondary | ICD-10-CM | POA: Insufficient documentation

## 2014-04-24 DIAGNOSIS — E785 Hyperlipidemia, unspecified: Secondary | ICD-10-CM | POA: Insufficient documentation

## 2014-04-24 DIAGNOSIS — IMO0002 Reserved for concepts with insufficient information to code with codable children: Secondary | ICD-10-CM

## 2014-04-24 DIAGNOSIS — G8929 Other chronic pain: Secondary | ICD-10-CM | POA: Insufficient documentation

## 2014-04-24 DIAGNOSIS — I2510 Atherosclerotic heart disease of native coronary artery without angina pectoris: Secondary | ICD-10-CM | POA: Insufficient documentation

## 2014-04-24 DIAGNOSIS — R079 Chest pain, unspecified: Secondary | ICD-10-CM | POA: Insufficient documentation

## 2014-04-24 DIAGNOSIS — M109 Gout, unspecified: Secondary | ICD-10-CM | POA: Insufficient documentation

## 2014-04-24 DIAGNOSIS — Z79899 Other long term (current) drug therapy: Secondary | ICD-10-CM | POA: Insufficient documentation

## 2014-04-24 DIAGNOSIS — F329 Major depressive disorder, single episode, unspecified: Secondary | ICD-10-CM | POA: Insufficient documentation

## 2014-04-24 DIAGNOSIS — I10 Essential (primary) hypertension: Secondary | ICD-10-CM | POA: Insufficient documentation

## 2014-04-24 DIAGNOSIS — Z791 Long term (current) use of non-steroidal anti-inflammatories (NSAID): Secondary | ICD-10-CM | POA: Insufficient documentation

## 2014-04-24 DIAGNOSIS — E119 Type 2 diabetes mellitus without complications: Secondary | ICD-10-CM | POA: Insufficient documentation

## 2014-04-24 DIAGNOSIS — Z21 Asymptomatic human immunodeficiency virus [HIV] infection status: Secondary | ICD-10-CM | POA: Insufficient documentation

## 2014-04-24 DIAGNOSIS — Z7902 Long term (current) use of antithrombotics/antiplatelets: Secondary | ICD-10-CM | POA: Insufficient documentation

## 2014-04-24 DIAGNOSIS — R739 Hyperglycemia, unspecified: Secondary | ICD-10-CM

## 2014-04-24 DIAGNOSIS — J45901 Unspecified asthma with (acute) exacerbation: Secondary | ICD-10-CM | POA: Insufficient documentation

## 2014-04-24 DIAGNOSIS — Z794 Long term (current) use of insulin: Secondary | ICD-10-CM | POA: Insufficient documentation

## 2014-04-24 DIAGNOSIS — Z9861 Coronary angioplasty status: Secondary | ICD-10-CM | POA: Insufficient documentation

## 2014-04-24 LAB — CBC
HCT: 32.2 % — ABNORMAL LOW (ref 39.0–52.0)
HEMOGLOBIN: 10.6 g/dL — AB (ref 13.0–17.0)
MCH: 27.5 pg (ref 26.0–34.0)
MCHC: 32.9 g/dL (ref 30.0–36.0)
MCV: 83.4 fL (ref 78.0–100.0)
PLATELETS: 134 10*3/uL — AB (ref 150–400)
RBC: 3.86 MIL/uL — ABNORMAL LOW (ref 4.22–5.81)
RDW: 12.7 % (ref 11.5–15.5)
WBC: 3.1 10*3/uL — ABNORMAL LOW (ref 4.0–10.5)

## 2014-04-24 LAB — BASIC METABOLIC PANEL
ANION GAP: 13 (ref 5–15)
BUN: 14 mg/dL (ref 6–23)
CO2: 24 meq/L (ref 19–32)
Calcium: 9.3 mg/dL (ref 8.4–10.5)
Chloride: 103 mEq/L (ref 96–112)
Creatinine, Ser: 1.37 mg/dL — ABNORMAL HIGH (ref 0.50–1.35)
GFR calc Af Amer: 61 mL/min — ABNORMAL LOW (ref >90)
GFR calc non Af Amer: 53 mL/min — ABNORMAL LOW (ref >90)
Glucose, Bld: 406 mg/dL — ABNORMAL HIGH (ref 70–99)
Potassium: 4.1 mEq/L (ref 3.7–5.3)
SODIUM: 140 meq/L (ref 137–147)

## 2014-04-24 LAB — RAPID URINE DRUG SCREEN, HOSP PERFORMED
Amphetamines: NOT DETECTED
Barbiturates: NOT DETECTED
Benzodiazepines: NOT DETECTED
Cocaine: POSITIVE — AB
OPIATES: NOT DETECTED
Tetrahydrocannabinol: NOT DETECTED

## 2014-04-24 LAB — I-STAT TROPONIN, ED: TROPONIN I, POC: 0 ng/mL (ref 0.00–0.08)

## 2014-04-24 LAB — ETHANOL

## 2014-04-24 LAB — CBG MONITORING, ED: Glucose-Capillary: 388 mg/dL — ABNORMAL HIGH (ref 70–99)

## 2014-04-24 MED ORDER — INSULIN GLARGINE 100 UNIT/ML ~~LOC~~ SOLN: SUBCUTANEOUS | Status: DC

## 2014-04-24 MED ORDER — INSULIN ASPART 100 UNIT/ML ~~LOC~~ SOLN: 10.0000 [IU] | Freq: Once | SUBCUTANEOUS | Status: DC

## 2014-04-24 NOTE — ED Provider Notes (Signed)
CSN: [id number]     Arrival date & time 04/24/14  1118 History   First MD Initiated Contact with Patient 04/24/14 1329     Chief Complaint  Patient presents with  . Detox   . Chest Pain     (Consider location/radiation/quality/duration/timing/severity/associated sxs/prior Treatment) HPI Comments: Patient with history of HIV, diabetes, previous stent placement -- presents with request for help with substance abuse. Patient states that he has been using crack cocaine intermittently for the past 20 years. Patient states that since July 2 he has been binging on crack cocaine. He has used more than $1200 worth of crack cocaine since this time. His last use was this morning. He denies use of other substances including alcohol. Patient admits to feeling depressed but denies any acute suicidal ideation. He denies homicidal ideation. He denies auditory or visual hallucinations. Patient complains of chronic chest pain that he has had for "years". Chest pain occurs every day, waxes and wanes, never goes completely away, and is not brought on with his use of cocaine. No recent changes in the character or or frequency of this pain. Patient states he has shortness of breath at baseline for which he uses a breathing machine. He admits to not being able to afford his medications, including diabetes medications, due to his substance abuse issue.   The history is provided by the patient and medical records.    Past Medical History  Diagnosis Date  . Diabetes mellitus     Diagnosed in 2002, started insulin in 2012  . Hypertension   . Headache(784.0)     CT head 08/2011: Periventricular and subcortical white matter hypodensities are most in keeping with chronic microangiopathic change  . Gout   . Hyperlipidemia   . HIV infection Nov 2012    Followed by [name]  . Crack cocaine use     for 20+ years, has been enrolled in detox programs in the past  . Chondromalacia of medial femoral condyle     Left  knee MRI 04/28/12: Chondromalacia of the medial femoral condyle with slight peripheral degeneration of the meniscocapsular junction of the medial meniscus; followed by sports medicine  . Asthma     No PFTs, history of childhood asthma  . Depression     with history of hospitalization for suicidal ideation  . CAD (coronary artery disease) 06/10/2013    Presented as STEMI 06/10/13; s/p BMS to LAD; done at [facility] (LAD prox 95% with thrombus, mid 75%, D2 75%; LCX OM2 75%; RCA small, PDA 95%, PLV 95%)  . Gout 04/28/2012   Past Surgical History  Procedure Laterality Date  . Prostate surgery    . Bowel resection    . Back surgery      1988  . Cardiac surgery     Family History  Problem Relation Age of Onset  . Diabetes Mother   . Diabetes Father   . Diabetes Brother   . Colon cancer Neg Hx    History  Substance Use Topics  . Smoking status: Never Smoker   . Smokeless tobacco: Never Used  . Alcohol Use: No    Review of Systems  Constitutional: Negative for fever.  HENT: Negative for rhinorrhea and sore throat.   Eyes: Negative for redness.  Respiratory: Positive for shortness of breath (chronic, unchanged). Negative for cough.   Cardiovascular: Positive for chest pain (chronic, unchanged).  Gastrointestinal: Negative for nausea, vomiting, abdominal pain and diarrhea.  Genitourinary: Negative for dysuria.  Musculoskeletal: Negative  for myalgias.  Skin: Negative for rash.  Neurological: Negative for headaches.  Psychiatric/Behavioral: Negative for suicidal ideas and self-injury. The patient is not nervous/anxious.       Allergies  Review of patient's allergies indicates no known allergies.  Home Medications   Prior to Admission medications   Medication Sig Start Date End Date Taking? Authorizing Provider  allopurinol (ZYLOPRIM) 100 MG tablet Take 2 tablets (200 mg total) by mouth every morning. For gout. 09/15/13  Yes Neema Bobbie Stack, MD  amantadine (SYMMETREL) 100 MG  capsule Take 100 mg by mouth daily.  09/22/12  Yes Historical Provider, MD  aspirin 81 MG chewable tablet Chew 81 mg by mouth daily. For blood thinner.   Yes Historical Provider, MD  benzonatate (TESSALON) 100 MG capsule Take 2 capsules (200 mg total) by mouth 2 (two) times daily as needed for cough. 02/24/14  Yes Hannah Muthersbaugh, PA-C  clopidogrel (PLAVIX) 75 MG tablet Take 75 mg by mouth daily with breakfast.  06/13/13 06/13/14 Yes Historical Provider, MD  diclofenac sodium (VOLTAREN) 1 % GEL Apply 2 g topically 2 (two) times daily as needed (pain).   Yes Historical Provider, MD  diltiazem (CARDIZEM CD) 120 MG 24 hr capsule Take 120 mg by mouth daily. Take 1 capsule (120 mg total) by mouth daily. 06/13/13  Yes Historical Provider, MD  DULoxetine (CYMBALTA) 60 MG capsule Take 1 capsule (60 mg total) by mouth daily.   Yes Othella Boyer, MD  Emtricitab-Rilpivir-Tenofovir (COMPLERA) 200-25-300 MG TABS Take 1 tablet by mouth daily. 02/01/14  Yes [name], MD  gabapentin (NEURONTIN) 300 MG capsule Take 300 mg by mouth 3 (three) times daily.   Yes Historical Provider, MD  HYDROcodone-homatropine (HYCODAN) 5-1.5 MG/5ML syrup Take 5 mLs by mouth every 6 (six) hours as needed for cough.   Yes Historical Provider, MD  insulin glargine (LANTUS) 100 UNIT/ML injection Inject 30 units daily before bed 06/24/13 06/24/14 Yes Neema K Sharda, MD  ipratropium-albuterol (DUONEB) 0.5-2.5 (3) MG/3ML SOLN Take 3 mLs by nebulization every 2 (two) hours as needed (shortness of breath).   Yes Historical Provider, MD  lisinopril (PRINIVIL,ZESTRIL) 5 MG tablet Take 1 tablet (5 mg total) by mouth daily. 08/06/13 08/06/14 Yes Neema Bobbie Stack, MD  LORazepam (ATIVAN) 0.5 MG tablet Take 0.5 mg by mouth every 8 (eight) hours as needed for anxiety.   Yes Historical Provider, MD  metFORMIN (GLUCOPHAGE) 1000 MG tablet Take 1 tablet (1,000 mg total) by mouth 2 (two) times daily with a meal. 05/12/13  Yes Neema Bobbie Stack, MD  metoprolol  succinate (TOPROL-XL) 25 MG 24 hr tablet Take 25 mg by mouth daily.   Yes Historical Provider, MD  pravastatin (PRAVACHOL) 40 MG tablet Take 40 mg by mouth every evening.   Yes Historical Provider, MD  PROAIR HFA 108 (90 BASE) MCG/ACT inhaler INHALE TWO PUFFS INTO THE LUNGS EVERY 6 HOURS AS NEEDED FOR WHEEZING.   Yes Neema Bobbie Stack, MD  traMADol (ULTRAM) 50 MG tablet Take 1 tablet (50 mg total) by mouth every 6 (six) hours as needed for pain. 06/24/13  Yes Neema Bobbie Stack, MD  nitroGLYCERIN (NITROSTAT) 0.4 MG SL tablet 0.4 mg. Place 1 tablet (0.4 mg total) under the tongue every 5 (five) minutes as needed for Chest pain. 06/13/13 06/13/14  Historical Provider, MD   BP 116/70  Pulse 107  Temp(Src) 98.5 F (36.9 C) (Oral)  Resp 22  SpO2 98% Physical Exam  Nursing note and vitals reviewed. Constitutional: He  appears well-developed and well-nourished.  HENT:  Head: Normocephalic and atraumatic.  Mouth/Throat: Mucous membranes are normal. Mucous membranes are not dry.  Eyes: Conjunctivae are normal. Right eye exhibits no discharge. Left eye exhibits no discharge.  Neck: Trachea normal and normal range of motion. Neck supple. Normal carotid pulses and no JVD present. No muscular tenderness present. Carotid bruit is not present. No tracheal deviation present.  Cardiovascular: Normal rate, regular rhythm, S1 normal, S2 normal, normal heart sounds and intact distal pulses.  Exam reveals no distant heart sounds and no decreased pulses.   No murmur heard. Pulmonary/Chest: Effort normal and breath sounds normal. No respiratory distress. He has no wheezes. He exhibits no tenderness.  Abdominal: Soft. Normal aorta and bowel sounds are normal. There is no tenderness. There is no rebound and no guarding.  Musculoskeletal: He exhibits no edema.  Neurological: He is alert.  Skin: Skin is warm and dry. He is not diaphoretic. No cyanosis. No pallor.  Psychiatric: His speech is normal and behavior is normal. Thought  content is not paranoid and not delusional. He exhibits a depressed mood. He expresses no homicidal and no suicidal ideation. He expresses no suicidal plans and no homicidal plans.    ED Course  Procedures (including critical care time) Labs Review Labs Reviewed  CBC - Abnormal; Notable for the following:    WBC 3.1 (*)    RBC 3.86 (*)    Hemoglobin 10.6 (*)    HCT 32.2 (*)    Platelets 134 (*)    All other components within normal limits  BASIC METABOLIC PANEL - Abnormal; Notable for the following:    Glucose, Bld 406 (*)    Creatinine, Ser 1.37 (*)    GFR calc non Af Amer 53 (*)    GFR calc Af Amer 61 (*)    All other components within normal limits  URINE RAPID DRUG SCREEN (HOSP PERFORMED) - Abnormal; Notable for the following:    Cocaine POSITIVE (*)    All other components within normal limits  CBG MONITORING, ED - Abnormal; Notable for the following:    Glucose-Capillary 388 (*)    All other components within normal limits  ETHANOL  I-STAT TROPOININ, ED    Imaging Review No results found.   EKG Interpretation   Date/Time:  Sunday April 24 2014 12:00:26 EDT Ventricular Rate:  100 PR Interval:  136 QRS Duration: 91 QT Interval:  332 QTC Calculation: 428 R Axis:   60 Text Interpretation:  Sinus tachycardia Abnormal R-wave progression, early  transition Borderline T wave abnormalities Confirmed by Zenia Resides  MD, ANTHONY  ([id number]) on 04/24/2014 2:39:16 PM      2:50 PM Patient seen and examined. Work-up initiated.    Vital signs reviewed and are as follows: Filed Vitals:   04/24/14 1205  BP: 116/70  Pulse: 107  Temp: 98.5 F (36.9 C)  Resp: 22   Patient was discussed with and seen by Dr. Zenia Resides. Patient given outpatient referrals for cocaine rehabilitation. He is made aware that there is no cocaine detox available.   Order placed for subcutaneous insulin. No signs of DKA.   Patient left the department without informing staff, without his paperwork.     MDM    Final diagnoses:  Cocaine abuse  Hyperglycemia   Cocaine abuse: Patient was going to be given outpatient referrals but left the ED prior to receiving them.   Hyperglycemia: no signs of DKA, AG=13, patient non-compliant with medications  Depression: No current SI/HI,  hallucinations or delusions  Chest pain: is chronic, not-likely to be cardiac as patient has had pain prior to and after stenting, it is constant, and has lasted for months to years. Troponin neg. No change in pain recently.     Carlisle Cater, PA-C 04/24/14 1550

## 2014-04-24 NOTE — ED Notes (Signed)
Pt reports that he is here to detox from crack/cococaine. Pt states that he has CP all the time, even when he is not smoking crack. Pt adds that he has hx of 4 heart stents. Pt is A&O and in NAD

## 2014-04-24 NOTE — Discharge Instructions (Signed)
Please read and follow all provided instructions.  Your diagnoses today include:  1. Cocaine abuse   2. Hyperglycemia     Tests performed today include:  Vital signs. See below for your results today.   Medications prescribed:   Lantus - medication for high blood sugar  Home care instructions:  Follow any educational materials contained in this packet.  Follow-up instructions: Please follow-up with your primary care provider as needed for further evaluation of your symptoms.  Return instructions:   Please return to the Emergency Department if you experience worsening symptoms.   Please return if you have any other emergent concerns.   Additional Information:  Your vital signs today were: BP 116/70   Pulse 107   Temp(Src) 98.5 F (36.9 C) (Oral)   Resp 22   SpO2 98% If your blood pressure (BP) was elevated above 135/85 this visit, please have this repeated by your doctor within one month. ---------------

## 2014-04-24 NOTE — ED Notes (Addendum)
Pt stated that he is leaving because he was told by EDP that there are resources that can be given but no meds for detox for crack/cocaine. Pt advised that he was "going to [facility] ." Dr. Zenia Resides told pt not to go to [facility] and explained to pt the process for rehabilitation and resources that can be utilized. Pt was asked to remain in room until his paperwork was done. Pt left ED w/o d/c papers or rx.

## 2014-04-24 NOTE — ED Provider Notes (Signed)
Medical screening examination/treatment/procedure(s) were conducted as a shared visit with non-physician practitioner(s) and myself.  I personally evaluated the patient during the encounter.   EKG Interpretation   Date/Time:  Sunday April 24 2014 12:00:26 EDT Ventricular Rate:  100 PR Interval:  136 QRS Duration: 91 QT Interval:  332 QTC Calculation: 428 R Axis:   60 Text Interpretation:  Sinus tachycardia Abnormal R-wave progression, early  transition Borderline T wave abnormalities Confirmed by Zenia Resides  MD, Nikolaus Pienta  ([id number]) on 04/24/2014 2:39:16 PM     Patient here with request for detox from cocaine. He has no symptoms of chest pain associated with his cocaine use. No anginal type symptoms. Patient given outpatient resources. His blood sugar here was treated with insulin   Leota Jacobsen, MD 04/24/14 1452

## 2014-04-24 NOTE — ED Notes (Addendum)
Pt presents wanting detox from crack and c/o recurrent, sharp, L side chest pain since 06/2013.  Pain score 9/10.  Sts pain intermittently radiates into L shoulder.  Reports being on a binge since 7/2 and last use was this morning.

## 2014-04-27 ENCOUNTER — Encounter (HOSPITAL_COMMUNITY): Payer: Self-pay | Admitting: Emergency Medicine

## 2014-04-27 ENCOUNTER — Emergency Department (HOSPITAL_COMMUNITY)
Admission: EM | Admit: 2014-04-27 | Discharge: 2014-04-28 | Disposition: A | Discharge status: Home / Self Care | Payer: Medicare Other | Attending: Emergency Medicine | Admitting: Emergency Medicine

## 2014-04-27 DIAGNOSIS — I10 Essential (primary) hypertension: Secondary | ICD-10-CM | POA: Insufficient documentation

## 2014-04-27 DIAGNOSIS — J45909 Unspecified asthma, uncomplicated: Secondary | ICD-10-CM | POA: Insufficient documentation

## 2014-04-27 DIAGNOSIS — E785 Hyperlipidemia, unspecified: Secondary | ICD-10-CM | POA: Diagnosis not present

## 2014-04-27 DIAGNOSIS — M9420 Chondromalacia, unspecified site: Secondary | ICD-10-CM | POA: Diagnosis not present

## 2014-04-27 DIAGNOSIS — F3289 Other specified depressive episodes: Secondary | ICD-10-CM | POA: Insufficient documentation

## 2014-04-27 DIAGNOSIS — F329 Major depressive disorder, single episode, unspecified: Secondary | ICD-10-CM | POA: Diagnosis not present

## 2014-04-27 DIAGNOSIS — Z7902 Long term (current) use of antithrombotics/antiplatelets: Secondary | ICD-10-CM | POA: Insufficient documentation

## 2014-04-27 DIAGNOSIS — L0201 Cutaneous abscess of face: Secondary | ICD-10-CM

## 2014-04-27 DIAGNOSIS — I2510 Atherosclerotic heart disease of native coronary artery without angina pectoris: Secondary | ICD-10-CM | POA: Insufficient documentation

## 2014-04-27 DIAGNOSIS — M109 Gout, unspecified: Secondary | ICD-10-CM | POA: Insufficient documentation

## 2014-04-27 DIAGNOSIS — Z794 Long term (current) use of insulin: Secondary | ICD-10-CM | POA: Diagnosis not present

## 2014-04-27 DIAGNOSIS — Z21 Asymptomatic human immunodeficiency virus [HIV] infection status: Secondary | ICD-10-CM | POA: Diagnosis not present

## 2014-04-27 DIAGNOSIS — L03211 Cellulitis of face: Secondary | ICD-10-CM | POA: Diagnosis present

## 2014-04-27 DIAGNOSIS — Z79899 Other long term (current) drug therapy: Secondary | ICD-10-CM | POA: Diagnosis not present

## 2014-04-27 DIAGNOSIS — E119 Type 2 diabetes mellitus without complications: Secondary | ICD-10-CM | POA: Insufficient documentation

## 2014-04-27 NOTE — ED Provider Notes (Signed)
CSN: [id number]     Arrival date & time 04/27/14  2011 History  This chart was scribed for Starlyn Skeans, PA-C working with Dr. Lita Mains by Randa Evens, ED Scribe. This patient was seen in room TR06C/TR06C and the patient's care was started at 11:21 PM.      Chief Complaint  Patient presents with  . Abscess    The patient noticed he was getting a "boil' on the left cheek.  He said it started four days ago.  He denies drainage, N/ V or fever.     Patient is a 63 y.o. male presenting with abscess. The history is provided by the patient. No language interpreter was used.  Abscess Location:  Head/neck Abscess quality: painful   Associated symptoms: headaches   Associated symptoms: no fever, no nausea and no vomiting    HPI Comments: Michael [name] is a 63 y.o. male who presents to the Emergency Department complaining of abscess on the left side of his face onset 4 days prior. He describes the pain as sharp and aching. He states he has associated headache and facial swelling. He denies any drainage from the abscess. He states that he hasn't taken any medication for his symptoms. He denies fever, chills, nausea, vomiting, dental problem or trouble swallowing.   Past Medical History  Diagnosis Date  . Diabetes mellitus     Diagnosed in 2002, started insulin in 2012  . Hypertension   . Headache(784.0)     CT head 08/2011: Periventricular and subcortical white matter hypodensities are most in keeping with chronic microangiopathic change  . Gout   . Hyperlipidemia   . HIV infection Nov 2012    Followed by [name]  . Crack cocaine use     for 20+ years, has been enrolled in detox programs in the past  . Chondromalacia of medial femoral condyle     Left knee MRI 04/28/12: Chondromalacia of the medial femoral condyle with slight peripheral degeneration of the meniscocapsular junction of the medial meniscus; followed by sports medicine  . Asthma     No PFTs, history of childhood  asthma  . Depression     with history of hospitalization for suicidal ideation  . CAD (coronary artery disease) 06/10/2013    Presented as STEMI 06/10/13; s/p BMS to LAD; done at [facility] (LAD prox 95% with thrombus, mid 75%, D2 75%; LCX OM2 75%; RCA small, PDA 95%, PLV 95%)  . Gout 04/28/2012   Past Surgical History  Procedure Laterality Date  . Prostate surgery    . Bowel resection    . Back surgery      1988  . Cardiac surgery     Family History  Problem Relation Age of Onset  . Diabetes Mother   . Diabetes Father   . Diabetes Brother   . Colon cancer Neg Hx    History  Substance Use Topics  . Smoking status: Never Smoker   . Smokeless tobacco: Never Used  . Alcohol Use: No    Review of Systems  Constitutional: Negative for fever and chills.  HENT: Positive for facial swelling. Negative for dental problem and trouble swallowing.   Gastrointestinal: Negative for nausea and vomiting.  Neurological: Positive for headaches.    Allergies  Review of patient's allergies indicates no known allergies.  Home Medications   Prior to Admission medications   Medication Sig Start Date End Date Taking? Authorizing Provider  albuterol (PROVENTIL HFA;VENTOLIN HFA) 108 (90 BASE) MCG/ACT inhaler Inhale  1 puff into the lungs every 6 (six) hours as needed for wheezing or shortness of breath.   Yes Historical Provider, MD  allopurinol (ZYLOPRIM) 100 MG tablet Take 2 tablets (200 mg total) by mouth every morning. For gout. 09/15/13  Yes Neema Bobbie Stack, MD  amantadine (SYMMETREL) 100 MG capsule Take 100 mg by mouth daily.  09/22/12  Yes Historical Provider, MD  aspirin 81 MG chewable tablet Chew 81 mg by mouth daily. For blood thinner.   Yes Historical Provider, MD  benzonatate (TESSALON) 100 MG capsule Take 2 capsules (200 mg total) by mouth 2 (two) times daily as needed for cough. 02/24/14  Yes Hannah Muthersbaugh, PA-C  clopidogrel (PLAVIX) 75 MG tablet Take 75 mg by mouth daily with  breakfast.  06/13/13 06/13/14 Yes Historical Provider, MD  diclofenac sodium (VOLTAREN) 1 % GEL Apply 2 g topically 2 (two) times daily as needed (pain).   Yes Historical Provider, MD  diltiazem (CARDIZEM CD) 120 MG 24 hr capsule Take 120 mg by mouth daily. Take 1 capsule (120 mg total) by mouth daily. 06/13/13  Yes Historical Provider, MD  DULoxetine (CYMBALTA) 60 MG capsule Take 1 capsule (60 mg total) by mouth daily.   Yes Othella Boyer, MD  Emtricitab-Rilpivir-Tenofovir (COMPLERA) 200-25-300 MG TABS Take 1 tablet by mouth daily. 02/01/14  Yes [name], MD  gabapentin (NEURONTIN) 300 MG capsule Take 300 mg by mouth 3 (three) times daily.   Yes Historical Provider, MD  HYDROcodone-homatropine (HYCODAN) 5-1.5 MG/5ML syrup Take 5 mLs by mouth every 6 (six) hours as needed for cough.   Yes Historical Provider, MD  insulin glargine (LANTUS) 100 UNIT/ML injection Inject 30 units daily before bed 04/24/14 04/24/15 Yes Joshua Geiple, PA-C  ipratropium-albuterol (DUONEB) 0.5-2.5 (3) MG/3ML SOLN Take 3 mLs by nebulization every 2 (two) hours as needed (shortness of breath).   Yes Historical Provider, MD  lisinopril (PRINIVIL,ZESTRIL) 5 MG tablet Take 1 tablet (5 mg total) by mouth daily. 08/06/13 08/06/14 Yes Neema Bobbie Stack, MD  LORazepam (ATIVAN) 0.5 MG tablet Take 0.5 mg by mouth every 8 (eight) hours as needed for anxiety.   Yes Historical Provider, MD  metFORMIN (GLUCOPHAGE) 1000 MG tablet Take 1 tablet (1,000 mg total) by mouth 2 (two) times daily with a meal. 05/12/13  Yes Neema Bobbie Stack, MD  metoprolol succinate (TOPROL-XL) 25 MG 24 hr tablet Take 25 mg by mouth at bedtime.    Yes Historical Provider, MD  pravastatin (PRAVACHOL) 40 MG tablet Take 40 mg by mouth every evening.   Yes Historical Provider, MD  traMADol (ULTRAM) 50 MG tablet Take 1 tablet (50 mg total) by mouth every 6 (six) hours as needed for pain. 06/24/13  Yes Neema Bobbie Stack, MD  nitroGLYCERIN (NITROSTAT) 0.4 MG SL tablet 0.4 mg. Place 1  tablet (0.4 mg total) under the tongue every 5 (five) minutes as needed for Chest pain. 06/13/13 06/13/14  Historical Provider, MD   Triage Vitals: BP 139/75  Pulse 115  Temp(Src) 98.3 F (36.8 C) (Oral)  Resp 16  SpO2 99%  Physical Exam  Nursing note and vitals reviewed. Constitutional: He is oriented to person, place, and time. He appears well-developed and well-nourished. No distress.  HENT:  Head: Normocephalic and atraumatic.  Mouth/Throat: Uvula is midline, oropharynx is clear and moist and mucous membranes are normal. No oral lesions. No trismus in the jaw. No oropharyngeal exudate.  Poor dentition but no obvious caries, gingival swelling, or abscess  Eyes: Conjunctivae and  EOM are normal. No scleral icterus.  Neck: Normal range of motion. Neck supple. No tracheal deviation present. No thyromegaly present.  Cardiovascular: Normal rate, regular rhythm, normal heart sounds and intact distal pulses.  Exam reveals no gallop and no friction rub.   No murmur heard. Pulmonary/Chest: Effort normal and breath sounds normal. No respiratory distress. He has no wheezes. He has no rales. He exhibits no tenderness.  Musculoskeletal: Normal range of motion.  Lymphadenopathy:    He has no cervical adenopathy.  Neurological: He is alert and oriented to person, place, and time.  Skin: Skin is warm and dry.     Psychiatric: He has a normal mood and affect. His behavior is normal. Judgment and thought content normal.    ED Course  INCISION AND DRAINAGE Date/Time: 04/27/2014 11:53 PM Performed by: Starlyn Skeans A Authorized by: Starlyn Skeans A Consent: Verbal consent obtained. Risks and benefits: risks, benefits and alternatives were discussed Consent given by: patient Patient understanding: patient states understanding of the procedure being performed Relevant documents: relevant documents present and verified Imaging studies: imaging studies available Patient identity confirmed:  verbally with patient Time out: Immediately prior to procedure a "time out" was called to verify the correct patient, procedure, equipment, support staff and site/side marked as required. Type: abscess Body area: head/neck Location details: face Anesthesia: local infiltration Local anesthetic: lidocaine 2% without epinephrine Anesthetic total: 2 ml Patient sedated: no Scalpel size: 10 Incision type: single straight Drainage: purulent and  serosanguinous Drainage amount: moderate Wound treatment: wound left open Patient tolerance: Patient tolerated the procedure well with no immediate complications.   (including critical care time) DIAGNOSTIC STUDIES: Oxygen Saturation is 99% on RA, normal by my interpretation.    COORDINATION OF CARE:    Labs Review Labs Reviewed - No data to display  Imaging Review No results found.   EKG Interpretation None      MDM   Final diagnoses:  Abscess of face   Patient is a 63 y.o. Male who presents to the ED with an abscess to the left face for 4 days.  Abscess was I&D'd as seen above.  There was no surrounding cellulitis as this time and patient in not immunocompromised.  Have told the patient to use warm compresses as much as he can tolerate.  Patient was told to have the wound checked in 2 days.  Patient was told to return for fever, nausea, vomiting, or swelling of the neck.  He states understanding and agreement to the above plan.      I personally performed the services described in this documentation, which was scribed in my presence. The recorded information has been reviewed and is accurate.      Cherylann Parr, PA-C 04/28/14 0259  Cherylann Parr, PA-C 04/28/14 [id number]

## 2014-04-27 NOTE — ED Notes (Signed)
Forcucci, PA-C at bedside.

## 2014-04-27 NOTE — ED Notes (Signed)
I&D kit at the bedside.

## 2014-04-27 NOTE — ED Notes (Signed)
The patient noticed he was getting a "boil' on the left cheek.  He said it started four days ago.  He denies drainage, N/ V or fever.  He rates his pain 9/10.

## 2014-04-27 NOTE — ED Notes (Signed)
Patient states he used to shave his face clean, and decided to grow full beard approximately a month ago.  He noticed boil and pain a few days ago

## 2014-04-27 NOTE — ED Provider Notes (Signed)
Medical screening examination/treatment/procedure(s) were conducted as a shared visit with non-physician practitioner(s) and myself.  I personally evaluated the patient during the encounter.   EKG Interpretation   Date/Time:  Sunday April 24 2014 12:00:26 EDT Ventricular Rate:  100 PR Interval:  136 QRS Duration: 91 QT Interval:  332 QTC Calculation: 428 R Axis:   60 Text Interpretation:  Sinus tachycardia Abnormal R-wave progression, early  transition Borderline T wave abnormalities Confirmed by Zenia Resides  MD, Jatniel Verastegui  ([id number]) on 04/24/2014 2:39:16 PM       Leota Jacobsen, MD 04/27/14 [phone]

## 2014-04-27 NOTE — Discharge Instructions (Signed)
Abscess °Care After °An abscess (also called a boil or furuncle) is an infected area that contains a collection of pus. Signs and symptoms of an abscess include pain, tenderness, redness, or hardness, or you may feel a moveable soft area under your skin. An abscess can occur anywhere in the body. The infection may spread to surrounding tissues causing cellulitis. A cut (incision) by the surgeon was made over your abscess and the pus was drained out. Gauze may have been packed into the space to provide a drain that will allow the cavity to heal from the inside outwards. The boil may be painful for 5 to 7 days. Most people with a boil do not have high fevers. Your abscess, if seen early, may not have localized, and may not have been lanced. If not, another appointment may be required for this if it does not get better on its own or with medications. °HOME CARE INSTRUCTIONS  °· Only take over-the-counter or prescription medicines for pain, discomfort, or fever as directed by your caregiver. °· When you bathe, soak and then remove gauze or iodoform packs at least daily or as directed by your caregiver. You may then wash the wound gently with mild soapy water. Repack with gauze or do as your caregiver directs. °SEEK IMMEDIATE MEDICAL CARE IF:  °· You develop increased pain, swelling, redness, drainage, or bleeding in the wound site. °· You develop signs of generalized infection including muscle aches, chills, fever, or a general ill feeling. °· An oral temperature above 102° F (38.9° C) develops, not controlled by medication. °See your caregiver for a recheck if you develop any of the symptoms described above. If medications (antibiotics) were prescribed, take them as directed. °Document Released: 04/11/2005 Document Revised: 12/16/2011 Document Reviewed: 12/07/2007 °[employer]® Patient Information ©2015 [employer]. This information is not intended to replace advice given to you by your health care provider. Make sure  you discuss any questions you have with your health care provider. ° °

## 2014-04-28 NOTE — ED Provider Notes (Signed)
abdominal tenderness   Michael Rice, MD 04/28/14 [phone]

## 2014-04-28 NOTE — ED Notes (Signed)
BP retaken on both arms because pt was talking and moving during 23:57 assessment.

## 2014-04-30 ENCOUNTER — Encounter (HOSPITAL_COMMUNITY): Payer: Self-pay | Admitting: Emergency Medicine

## 2014-04-30 DIAGNOSIS — Z9861 Coronary angioplasty status: Secondary | ICD-10-CM

## 2014-04-30 DIAGNOSIS — E1149 Type 2 diabetes mellitus with other diabetic neurological complication: Secondary | ICD-10-CM | POA: Diagnosis present

## 2014-04-30 DIAGNOSIS — L03211 Cellulitis of face: Secondary | ICD-10-CM | POA: Diagnosis not present

## 2014-04-30 DIAGNOSIS — L0201 Cutaneous abscess of face: Principal | ICD-10-CM | POA: Diagnosis present

## 2014-04-30 DIAGNOSIS — E785 Hyperlipidemia, unspecified: Secondary | ICD-10-CM | POA: Diagnosis present

## 2014-04-30 DIAGNOSIS — Z79899 Other long term (current) drug therapy: Secondary | ICD-10-CM

## 2014-04-30 DIAGNOSIS — Z21 Asymptomatic human immunodeficiency virus [HIV] infection status: Secondary | ICD-10-CM | POA: Diagnosis present

## 2014-04-30 DIAGNOSIS — Z7982 Long term (current) use of aspirin: Secondary | ICD-10-CM

## 2014-04-30 DIAGNOSIS — I10 Essential (primary) hypertension: Secondary | ICD-10-CM | POA: Diagnosis present

## 2014-04-30 DIAGNOSIS — F1410 Cocaine abuse, uncomplicated: Secondary | ICD-10-CM | POA: Diagnosis present

## 2014-04-30 DIAGNOSIS — I498 Other specified cardiac arrhythmias: Secondary | ICD-10-CM | POA: Diagnosis present

## 2014-04-30 DIAGNOSIS — Z7902 Long term (current) use of antithrombotics/antiplatelets: Secondary | ICD-10-CM

## 2014-04-30 DIAGNOSIS — E1142 Type 2 diabetes mellitus with diabetic polyneuropathy: Secondary | ICD-10-CM | POA: Diagnosis present

## 2014-04-30 DIAGNOSIS — I2510 Atherosclerotic heart disease of native coronary artery without angina pectoris: Secondary | ICD-10-CM | POA: Diagnosis present

## 2014-04-30 DIAGNOSIS — Z794 Long term (current) use of insulin: Secondary | ICD-10-CM

## 2014-04-30 LAB — CBC WITH DIFFERENTIAL/PLATELET
BASOS ABS: 0 10*3/uL (ref 0.0–0.1)
Basophils Relative: 0 % (ref 0–1)
EOS PCT: 1 % (ref 0–5)
Eosinophils Absolute: 0.1 10*3/uL (ref 0.0–0.7)
HCT: 33.4 % — ABNORMAL LOW (ref 39.0–52.0)
Hemoglobin: 10.7 g/dL — ABNORMAL LOW (ref 13.0–17.0)
LYMPHS PCT: 24 % (ref 12–46)
Lymphs Abs: 1.1 10*3/uL (ref 0.7–4.0)
MCH: 27.2 pg (ref 26.0–34.0)
MCHC: 32 g/dL (ref 30.0–36.0)
MCV: 84.8 fL (ref 78.0–100.0)
Monocytes Absolute: 0.3 10*3/uL (ref 0.1–1.0)
Monocytes Relative: 6 % (ref 3–12)
NEUTROS ABS: 3 10*3/uL (ref 1.7–7.7)
Neutrophils Relative %: 69 % (ref 43–77)
Platelets: 170 10*3/uL (ref 150–400)
RBC: 3.94 MIL/uL — ABNORMAL LOW (ref 4.22–5.81)
RDW: 12.7 % (ref 11.5–15.5)
WBC: 4.4 10*3/uL (ref 4.0–10.5)

## 2014-04-30 LAB — COMPREHENSIVE METABOLIC PANEL
ALT: 12 U/L (ref 0–53)
AST: 12 U/L (ref 0–37)
Albumin: 3.2 g/dL — ABNORMAL LOW (ref 3.5–5.2)
Alkaline Phosphatase: 80 U/L (ref 39–117)
Anion gap: 14 (ref 5–15)
BUN: 21 mg/dL (ref 6–23)
CALCIUM: 8.8 mg/dL (ref 8.4–10.5)
CHLORIDE: 97 meq/L (ref 96–112)
CO2: 23 meq/L (ref 19–32)
Creatinine, Ser: 1.32 mg/dL (ref 0.50–1.35)
GFR calc Af Amer: 64 mL/min — ABNORMAL LOW (ref >90)
GFR, EST NON AFRICAN AMERICAN: 55 mL/min — AB (ref >90)
Glucose, Bld: 606 mg/dL (ref 70–99)
Potassium: 4.2 mEq/L (ref 3.7–5.3)
Sodium: 134 mEq/L — ABNORMAL LOW (ref 137–147)
Total Bilirubin: <0.2 mg/dL — ABNORMAL LOW (ref 0.3–1.2)
Total Protein: 7 g/dL (ref 6.0–8.3)

## 2014-04-30 LAB — CBG MONITORING, ED: Glucose-Capillary: 575 mg/dL (ref 70–99)

## 2014-04-30 NOTE — ED Notes (Addendum)
Pt in c/o abscess to left facial area, seen for same a few days and had I and D completed, since that time first abscess has continued to drain and swell, pain has moved to left ear area, also another abscess is starting closer to patient mouth, denies fever. Pt is diabetic.

## 2014-05-01 ENCOUNTER — Inpatient Hospital Stay (HOSPITAL_COMMUNITY)
Admission: EM | Admit: 2014-05-01 | Discharge: 2014-05-02 | DRG: 603 | Disposition: A | Discharge status: Home / Self Care | Payer: Medicare Other | Attending: Internal Medicine | Admitting: Internal Medicine

## 2014-05-01 ENCOUNTER — Encounter (HOSPITAL_COMMUNITY): Payer: Self-pay

## 2014-05-01 ENCOUNTER — Emergency Department (HOSPITAL_COMMUNITY): Payer: Medicare Other

## 2014-05-01 DIAGNOSIS — Z7902 Long term (current) use of antithrombotics/antiplatelets: Secondary | ICD-10-CM | POA: Diagnosis not present

## 2014-05-01 DIAGNOSIS — L0201 Cutaneous abscess of face: Secondary | ICD-10-CM | POA: Diagnosis present

## 2014-05-01 DIAGNOSIS — E785 Hyperlipidemia, unspecified: Secondary | ICD-10-CM | POA: Diagnosis present

## 2014-05-01 DIAGNOSIS — L03211 Cellulitis of face: Principal | ICD-10-CM

## 2014-05-01 DIAGNOSIS — I498 Other specified cardiac arrhythmias: Secondary | ICD-10-CM | POA: Diagnosis present

## 2014-05-01 DIAGNOSIS — E1149 Type 2 diabetes mellitus with other diabetic neurological complication: Secondary | ICD-10-CM

## 2014-05-01 DIAGNOSIS — Z794 Long term (current) use of insulin: Secondary | ICD-10-CM | POA: Diagnosis not present

## 2014-05-01 DIAGNOSIS — Z21 Asymptomatic human immunodeficiency virus [HIV] infection status: Secondary | ICD-10-CM | POA: Diagnosis present

## 2014-05-01 DIAGNOSIS — Z7982 Long term (current) use of aspirin: Secondary | ICD-10-CM | POA: Diagnosis not present

## 2014-05-01 DIAGNOSIS — E1142 Type 2 diabetes mellitus with diabetic polyneuropathy: Secondary | ICD-10-CM | POA: Diagnosis present

## 2014-05-01 DIAGNOSIS — I2510 Atherosclerotic heart disease of native coronary artery without angina pectoris: Secondary | ICD-10-CM | POA: Diagnosis present

## 2014-05-01 DIAGNOSIS — I10 Essential (primary) hypertension: Secondary | ICD-10-CM | POA: Diagnosis present

## 2014-05-01 DIAGNOSIS — R739 Hyperglycemia, unspecified: Secondary | ICD-10-CM

## 2014-05-01 DIAGNOSIS — Z9861 Coronary angioplasty status: Secondary | ICD-10-CM | POA: Diagnosis not present

## 2014-05-01 DIAGNOSIS — F1410 Cocaine abuse, uncomplicated: Secondary | ICD-10-CM | POA: Diagnosis present

## 2014-05-01 DIAGNOSIS — Z79899 Other long term (current) drug therapy: Secondary | ICD-10-CM | POA: Diagnosis not present

## 2014-05-01 DIAGNOSIS — I499 Cardiac arrhythmia, unspecified: Secondary | ICD-10-CM

## 2014-05-01 DIAGNOSIS — B20 Human immunodeficiency virus [HIV] disease: Secondary | ICD-10-CM

## 2014-05-01 HISTORY — DX: Cellulitis, unspecified: L03.90

## 2014-05-01 HISTORY — DX: Systemic involvement of connective tissue, unspecified: M35.9

## 2014-05-01 LAB — URINALYSIS, ROUTINE W REFLEX MICROSCOPIC
BILIRUBIN URINE: NEGATIVE
Glucose, UA: >1000 mg/dL — AB
KETONES UR: NEGATIVE mg/dL
LEUKOCYTES UA: NEGATIVE
NITRITE: NEGATIVE
PROTEIN: NEGATIVE mg/dL
Specific Gravity, Urine: 1.03 (ref 1.005–1.030)
UROBILINOGEN UA: 0.2 mg/dL (ref 0.0–1.0)
pH: 5 (ref 5.0–8.0)

## 2014-05-01 LAB — CBC
HCT: 30.3 % — ABNORMAL LOW (ref 39.0–52.0)
HEMOGLOBIN: 9.9 g/dL — AB (ref 13.0–17.0)
MCH: 27 pg (ref 26.0–34.0)
MCHC: 32.7 g/dL (ref 30.0–36.0)
MCV: 82.6 fL (ref 78.0–100.0)
Platelets: 164 10*3/uL (ref 150–400)
RBC: 3.67 MIL/uL — AB (ref 4.22–5.81)
RDW: 12.5 % (ref 11.5–15.5)
WBC: 5.6 10*3/uL (ref 4.0–10.5)

## 2014-05-01 LAB — GLUCOSE, CAPILLARY
GLUCOSE-CAPILLARY: 195 mg/dL — AB (ref 70–99)
Glucose-Capillary: 336 mg/dL — ABNORMAL HIGH (ref 70–99)
Glucose-Capillary: 212 mg/dL — ABNORMAL HIGH (ref 70–99)
Glucose-Capillary: 211 mg/dL — ABNORMAL HIGH (ref 70–99)

## 2014-05-01 LAB — HEMOGLOBIN A1C
HEMOGLOBIN A1C: 11.4 % — AB (ref <5.7)
MEAN PLASMA GLUCOSE: 280 mg/dL — AB (ref <117)

## 2014-05-01 LAB — RAPID URINE DRUG SCREEN, HOSP PERFORMED
Amphetamines: NOT DETECTED
BARBITURATES: NOT DETECTED
Benzodiazepines: NOT DETECTED
Cocaine: NOT DETECTED
Opiates: NOT DETECTED
Tetrahydrocannabinol: NOT DETECTED

## 2014-05-01 LAB — CREATININE, SERUM
CREATININE: 1.01 mg/dL (ref 0.50–1.35)
GFR calc Af Amer: 89 mL/min — ABNORMAL LOW (ref >90)
GFR, EST NON AFRICAN AMERICAN: 77 mL/min — AB (ref >90)

## 2014-05-01 LAB — CBG MONITORING, ED
GLUCOSE-CAPILLARY: 354 mg/dL — AB (ref 70–99)
Glucose-Capillary: 208 mg/dL — ABNORMAL HIGH (ref 70–99)

## 2014-05-01 LAB — URINE MICROSCOPIC-ADD ON

## 2014-05-01 MED ORDER — HYDROCODONE-ACETAMINOPHEN 5-325 MG PO TABS
1.0000 | ORAL_TABLET | ORAL | Status: DC | PRN
  Administered 2014-05-01: 2 via ORAL
  Administered 2014-05-02: 1 via ORAL
  Filled 2014-05-01: qty 1
  Filled 2014-05-01: qty 2

## 2014-05-01 MED ORDER — SODIUM CHLORIDE 0.9 % IV SOLN
INTRAVENOUS | Status: AC
  Administered 2014-05-01: 09:00:00 via INTRAVENOUS

## 2014-05-01 MED ORDER — METOPROLOL SUCCINATE ER 25 MG PO TB24
25.0000 mg | ORAL_TABLET | Freq: Every day | ORAL | Status: DC
  Administered 2014-05-01: 25 mg via ORAL
  Filled 2014-05-01 (×2): qty 1

## 2014-05-01 MED ORDER — EMTRICITABINE 200 MG PO CAPS
200.0000 mg | ORAL_CAPSULE | Freq: Every day | ORAL | Status: DC
  Administered 2014-05-01 – 2014-05-02 (×2): 200 mg via ORAL
  Filled 2014-05-01 (×3): qty 1

## 2014-05-01 MED ORDER — SODIUM CHLORIDE 0.9 % IV BOLUS (SEPSIS)
1000.0000 mL | Freq: Once | INTRAVENOUS | Status: AC
  Administered 2014-05-01: 1000 mL via INTRAVENOUS

## 2014-05-01 MED ORDER — LISINOPRIL 5 MG PO TABS
5.0000 mg | ORAL_TABLET | Freq: Every day | ORAL | Status: DC
Start: 2014-05-01 — End: 2014-05-02
  Administered 2014-05-01 – 2014-05-02 (×2): 5 mg via ORAL
  Filled 2014-05-01 (×2): qty 1

## 2014-05-01 MED ORDER — ASPIRIN 81 MG PO CHEW
81.0000 mg | CHEWABLE_TABLET | Freq: Every day | ORAL | Status: DC
  Administered 2014-05-01 – 2014-05-02 (×2): 81 mg via ORAL
  Filled 2014-05-01 (×2): qty 1

## 2014-05-01 MED ORDER — VANCOMYCIN HCL IN DEXTROSE 1-5 GM/200ML-% IV SOLN
1000.0000 mg | Freq: Once | INTRAVENOUS | Status: AC
  Administered 2014-05-01: 1000 mg via INTRAVENOUS
  Filled 2014-05-01: qty 200

## 2014-05-01 MED ORDER — ONDANSETRON HCL 4 MG PO TABS: 4.0000 mg | ORAL_TABLET | Freq: Four times a day (QID) | ORAL | Status: DC | PRN

## 2014-05-01 MED ORDER — DULOXETINE HCL 60 MG PO CPEP
60.0000 mg | ORAL_CAPSULE | Freq: Every day | ORAL | Status: DC
  Administered 2014-05-01 – 2014-05-02 (×2): 60 mg via ORAL
  Filled 2014-05-01 (×2): qty 1

## 2014-05-01 MED ORDER — SODIUM CHLORIDE 0.9 % IV BOLUS (SEPSIS)
500.0000 mL | Freq: Once | INTRAVENOUS | Status: AC
Start: 2014-05-01 — End: 2014-05-01
  Administered 2014-05-01: 500 mL via INTRAVENOUS

## 2014-05-01 MED ORDER — PIPERACILLIN-TAZOBACTAM 3.375 G IVPB
3.3750 g | Freq: Three times a day (TID) | INTRAVENOUS | Status: DC
  Administered 2014-05-01 – 2014-05-02 (×3): 3.375 g via INTRAVENOUS
  Filled 2014-05-01 (×5): qty 50

## 2014-05-01 MED ORDER — VANCOMYCIN HCL 10 G IV SOLR
1250.0000 mg | INTRAVENOUS | Status: DC
  Administered 2014-05-01: 1250 mg via INTRAVENOUS
  Filled 2014-05-01 (×2): qty 1250

## 2014-05-01 MED ORDER — CLOPIDOGREL BISULFATE 75 MG PO TABS
75.0000 mg | ORAL_TABLET | Freq: Every day | ORAL | Status: DC
  Administered 2014-05-01 – 2014-05-02 (×2): 75 mg via ORAL
  Filled 2014-05-01 (×2): qty 1

## 2014-05-01 MED ORDER — AMANTADINE HCL 100 MG PO CAPS
100.0000 mg | ORAL_CAPSULE | Freq: Every day | ORAL | Status: DC
Start: 2014-05-01 — End: 2014-05-02
  Administered 2014-05-01 – 2014-05-02 (×2): 100 mg via ORAL
  Filled 2014-05-01 (×2): qty 1

## 2014-05-01 MED ORDER — GABAPENTIN 300 MG PO CAPS
300.0000 mg | ORAL_CAPSULE | Freq: Two times a day (BID) | ORAL | Status: DC
  Administered 2014-05-01 – 2014-05-02 (×3): 300 mg via ORAL
  Filled 2014-05-01 (×4): qty 1

## 2014-05-01 MED ORDER — ALBUTEROL SULFATE (2.5 MG/3ML) 0.083% IN NEBU: 3.0000 mL | INHALATION_SOLUTION | Freq: Four times a day (QID) | RESPIRATORY_TRACT | Status: DC | PRN

## 2014-05-01 MED ORDER — INSULIN ASPART 100 UNIT/ML ~~LOC~~ SOLN
0.0000 [IU] | Freq: Three times a day (TID) | SUBCUTANEOUS | Status: DC
  Administered 2014-05-01: 5 [IU] via SUBCUTANEOUS
  Administered 2014-05-01: 11 [IU] via SUBCUTANEOUS
  Administered 2014-05-02: 3 [IU] via SUBCUTANEOUS

## 2014-05-01 MED ORDER — INSULIN GLARGINE 100 UNIT/ML ~~LOC~~ SOLN
30.0000 [IU] | Freq: Every day | SUBCUTANEOUS | Status: DC
  Administered 2014-05-01: 30 [IU] via SUBCUTANEOUS
  Filled 2014-05-01 (×2): qty 0.3

## 2014-05-01 MED ORDER — INSULIN ASPART 100 UNIT/ML ~~LOC~~ SOLN
10.0000 [IU] | Freq: Once | SUBCUTANEOUS | Status: AC
  Administered 2014-05-01: 10 [IU] via INTRAVENOUS
  Filled 2014-05-01: qty 1

## 2014-05-01 MED ORDER — ALLOPURINOL 100 MG PO TABS
200.0000 mg | ORAL_TABLET | Freq: Every morning | ORAL | Status: DC
  Administered 2014-05-01 – 2014-05-02 (×2): 200 mg via ORAL
  Filled 2014-05-01 (×3): qty 2

## 2014-05-01 MED ORDER — HEPARIN SODIUM (PORCINE) 5000 UNIT/ML IJ SOLN
5000.0000 [IU] | Freq: Three times a day (TID) | INTRAMUSCULAR | Status: DC
  Administered 2014-05-01 – 2014-05-02 (×4): 5000 [IU] via SUBCUTANEOUS
  Filled 2014-05-01 (×6): qty 1

## 2014-05-01 MED ORDER — SIMVASTATIN 10 MG PO TABS
10.0000 mg | ORAL_TABLET | Freq: Every day | ORAL | Status: DC
  Administered 2014-05-01: 10 mg via ORAL
  Filled 2014-05-01 (×2): qty 1

## 2014-05-01 MED ORDER — IOHEXOL 300 MG/ML  SOLN
75.0000 mL | Freq: Once | INTRAMUSCULAR | Status: AC | PRN
  Administered 2014-05-01: 75 mL via INTRAVENOUS

## 2014-05-01 MED ORDER — EMTRICITAB-RILPIVIR-TENOFOV DF 200-25-300 MG PO TABS
1.0000 | ORAL_TABLET | Freq: Every day | ORAL | Status: DC
  Filled 2014-05-01: qty 1

## 2014-05-01 MED ORDER — TENOFOVIR DISOPROXIL FUMARATE 300 MG PO TABS
300.0000 mg | ORAL_TABLET | Freq: Every day | ORAL | Status: DC
  Administered 2014-05-01 – 2014-05-02 (×2): 300 mg via ORAL
  Filled 2014-05-01 (×3): qty 1

## 2014-05-01 MED ORDER — RILPIVIRINE HCL 25 MG PO TABS
25.0000 mg | ORAL_TABLET | Freq: Every day | ORAL | Status: DC
  Administered 2014-05-01 – 2014-05-02 (×2): 25 mg via ORAL
  Filled 2014-05-01 (×3): qty 1

## 2014-05-01 MED ORDER — PIPERACILLIN-TAZOBACTAM 3.375 G IVPB 30 MIN
3.3750 g | Freq: Once | INTRAVENOUS | Status: AC
Start: 2014-05-01 — End: 2014-05-01
  Administered 2014-05-01: 3.375 g via INTRAVENOUS
  Filled 2014-05-01: qty 50

## 2014-05-01 MED ORDER — DILTIAZEM HCL ER COATED BEADS 120 MG PO CP24
120.0000 mg | ORAL_CAPSULE | Freq: Every day | ORAL | Status: DC
  Administered 2014-05-01 – 2014-05-02 (×2): 120 mg via ORAL
  Filled 2014-05-01 (×2): qty 1

## 2014-05-01 MED ORDER — ONDANSETRON HCL 4 MG/2ML IJ SOLN: 4.0000 mg | Freq: Four times a day (QID) | INTRAMUSCULAR | Status: DC | PRN

## 2014-05-01 NOTE — ED Provider Notes (Signed)
CSN: [id number]     Arrival date & time 04/30/14  2223 History   First MD Initiated Contact with Patient 05/01/14 0155     Chief Complaint  Patient presents with  . Abscess     (Consider location/radiation/quality/duration/timing/severity/associated sxs/prior Treatment) HPI Patient was seen 2 days ago for abscess on the left side of his face. Had an I&D performed. Was discharged home. He states that the areas continue to drain and he has had swelling and tenderness around the site. He complains of fatigue but denies any nausea, vomiting, fever or chills. He states his blood sugar has been elevated but this is not able to take is insulin. Past Medical History  Diagnosis Date  . Diabetes mellitus     Diagnosed in 2002, started insulin in 2012  . Hypertension   . Headache(784.0)     CT head 08/2011: Periventricular and subcortical white matter hypodensities are most in keeping with chronic microangiopathic change  . Gout   . Hyperlipidemia   . HIV infection Nov 2012    Followed by [name]  . Crack cocaine use     for 20+ years, has been enrolled in detox programs in the past  . Chondromalacia of medial femoral condyle     Left knee MRI 04/28/12: Chondromalacia of the medial femoral condyle with slight peripheral degeneration of the meniscocapsular junction of the medial meniscus; followed by sports medicine  . Asthma     No PFTs, history of childhood asthma  . Depression     with history of hospitalization for suicidal ideation  . CAD (coronary artery disease) 06/10/2013    Presented as STEMI 06/10/13; s/p BMS to LAD; done at [facility] (LAD prox 95% with thrombus, mid 75%, D2 75%; LCX OM2 75%; RCA small, PDA 95%, PLV 95%)  . Gout 04/28/2012  . Collagen vascular disease    Past Surgical History  Procedure Laterality Date  . Prostate surgery    . Bowel resection    . Back surgery      1988  . Cardiac surgery     Family History  Problem Relation Age of Onset  . Diabetes Mother    . Diabetes Father   . Diabetes Brother   . Colon cancer Neg Hx    History  Substance Use Topics  . Smoking status: Never Smoker   . Smokeless tobacco: Never Used  . Alcohol Use: No    Review of Systems  Constitutional: Negative for fever and chills.  HENT: Positive for facial swelling.   Respiratory: Negative for cough and shortness of breath.   Cardiovascular: Negative for chest pain, palpitations and leg swelling.  Gastrointestinal: Negative for nausea, vomiting and abdominal pain.  Musculoskeletal: Negative for back pain, neck pain and neck stiffness.  Skin: Negative for rash and wound.  Neurological: Negative for dizziness, weakness, numbness and headaches.  All other systems reviewed and are negative.     Allergies  Review of patient's allergies indicates no known allergies.  Home Medications   Prior to Admission medications   Medication Sig Start Date End Date Taking? Authorizing Provider  albuterol (PROVENTIL HFA;VENTOLIN HFA) 108 (90 BASE) MCG/ACT inhaler Inhale 1 puff into the lungs every 6 (six) hours as needed for wheezing or shortness of breath.   Yes Historical Provider, MD  allopurinol (ZYLOPRIM) 100 MG tablet Take 2 tablets (200 mg total) by mouth every morning. For gout. 09/15/13  Yes Neema Bobbie Stack, MD  amantadine (SYMMETREL) 100 MG capsule Take 100  mg by mouth daily.  09/22/12  Yes Historical Provider, MD  aspirin 81 MG chewable tablet Chew 81 mg by mouth daily. For blood thinner.   Yes Historical Provider, MD  benzonatate (TESSALON) 100 MG capsule Take 2 capsules (200 mg total) by mouth 2 (two) times daily as needed for cough. 02/24/14  Yes Hannah Muthersbaugh, PA-C  clopidogrel (PLAVIX) 75 MG tablet Take 75 mg by mouth daily with breakfast.  06/13/13 06/13/14 Yes Historical Provider, MD  diclofenac sodium (VOLTAREN) 1 % GEL Apply 2 g topically 2 (two) times daily as needed (pain).   Yes Historical Provider, MD  diltiazem (CARDIZEM CD) 120 MG 24 hr capsule Take  120 mg by mouth daily. Take 1 capsule (120 mg total) by mouth daily. 06/13/13  Yes Historical Provider, MD  DULoxetine (CYMBALTA) 60 MG capsule Take 1 capsule (60 mg total) by mouth daily.   Yes Othella Boyer, MD  Emtricitab-Rilpivir-Tenofovir (COMPLERA) 200-25-300 MG TABS Take 1 tablet by mouth daily. 02/01/14  Yes [name], MD  gabapentin (NEURONTIN) 300 MG capsule Take 300 mg by mouth 3 (three) times daily.   Yes Historical Provider, MD  HYDROcodone-homatropine (HYCODAN) 5-1.5 MG/5ML syrup Take 5 mLs by mouth every 6 (six) hours as needed for cough.   Yes Historical Provider, MD  insulin glargine (LANTUS) 100 UNIT/ML injection Inject 30 units daily before bed 04/24/14 04/24/15 Yes Joshua Geiple, PA-C  ipratropium-albuterol (DUONEB) 0.5-2.5 (3) MG/3ML SOLN Take 3 mLs by nebulization every 2 (two) hours as needed (shortness of breath).   Yes Historical Provider, MD  lisinopril (PRINIVIL,ZESTRIL) 5 MG tablet Take 1 tablet (5 mg total) by mouth daily. 08/06/13 08/06/14 Yes Neema Bobbie Stack, MD  LORazepam (ATIVAN) 0.5 MG tablet Take 0.5 mg by mouth every 8 (eight) hours as needed for anxiety.   Yes Historical Provider, MD  metFORMIN (GLUCOPHAGE) 1000 MG tablet Take 1 tablet (1,000 mg total) by mouth 2 (two) times daily with a meal. 05/12/13  Yes Neema Bobbie Stack, MD  metoprolol succinate (TOPROL-XL) 25 MG 24 hr tablet Take 25 mg by mouth at bedtime.    Yes Historical Provider, MD  nitroGLYCERIN (NITROSTAT) 0.4 MG SL tablet 0.4 mg. Place 1 tablet (0.4 mg total) under the tongue every 5 (five) minutes as needed for Chest pain. 06/13/13 06/13/14 Yes Historical Provider, MD  pravastatin (PRAVACHOL) 40 MG tablet Take 40 mg by mouth every evening.   Yes Historical Provider, MD  traMADol (ULTRAM) 50 MG tablet Take 1 tablet (50 mg total) by mouth every 6 (six) hours as needed for pain. 06/24/13  Yes Neema K Burnard Bunting, MD   BP 136/87  Pulse 93  Temp(Src) 98.3 F (36.8 C) (Oral)  Resp 15  Wt 180 lb (81.647 kg)  SpO2  100% Physical Exam  Nursing note and vitals reviewed. Constitutional: He is oriented to person, place, and time. He appears well-developed and well-nourished. No distress.  HENT:  Head: Normocephalic and atraumatic.  Mouth/Throat: Oropharynx is clear and moist.  Patient has a large 5-6 cm in diameter mass to his left jaw. It is spontaneously draining purulent fluid. There is surrounding tenderness. Patient also has a much smaller abscess medial to the first. There is no obvious drainage. No intraoral masses.  Eyes: EOM are normal. Pupils are equal, round, and reactive to light.  Neck: Normal range of motion. Neck supple.  Cardiovascular: Normal rate and regular rhythm.   Pulmonary/Chest: Effort normal and breath sounds normal. No respiratory distress. He has no wheezes.  He has no rales. He exhibits no tenderness.  Abdominal: Soft. Bowel sounds are normal. He exhibits no distension and no mass. There is no tenderness. There is no rebound and no guarding.  Musculoskeletal: Normal range of motion. He exhibits no edema and no tenderness.  Neurological: He is alert and oriented to person, place, and time.  Moves all extremities without deficit. Sensation is grossly intact.  Skin: Skin is warm and dry. No rash noted. No erythema.  Psychiatric: He has a normal mood and affect. His behavior is normal.    ED Course  INCISION AND DRAINAGE Date/Time: 05/01/2014 5:27 AM Performed by: Julianne Rice Authorized by: Lita Mains, Ithzel Fedorchak Type: abscess Body area: head/neck Location details: face Local anesthetic: lidocaine 1% with epinephrine Anesthetic total: 4 ml Scalpel size: 11 Incision type: single straight Complexity: complex Drainage: purulent Drainage amount: moderate Wound treatment: wound left open Patient tolerance: Patient tolerated the procedure well with no immediate complications. Comments: Wound explored and loculations broken up.   (including critical care time) Labs Review Labs  Reviewed  CBC WITH DIFFERENTIAL - Abnormal; Notable for the following:    RBC 3.94 (*)    Hemoglobin 10.7 (*)    HCT 33.4 (*)    All other components within normal limits  COMPREHENSIVE METABOLIC PANEL - Abnormal; Notable for the following:    Sodium 134 (*)    Glucose, Bld 606 (*)    Albumin 3.2 (*)    Total Bilirubin <0.2 (*)    GFR calc non Af Amer 55 (*)    GFR calc Af Amer 64 (*)    All other components within normal limits  CBG MONITORING, ED - Abnormal; Notable for the following:    Glucose-Capillary 575 (*)    All other components within normal limits  CBG MONITORING, ED    Imaging Review No results found.   EKG Interpretation None      MDM   Final diagnoses:  None   Failed outpatient treatment of facial abscess with hyperglycemia. Will discuss with the patient's primary MD.    Internal medicine resident will see in emergency department and admit.   Julianne Rice, MD 05/01/14 [phone]

## 2014-05-01 NOTE — H&P (Signed)
Date: 05/01/2014               Patient Name:  Michael [name] MRN: [id number]  DOB: 1951/04/20 Age / Sex: 63 y.o., male   PCP: Othella Boyer, MD         Medical Service: Internal Medicine Teaching Service         Attending Physician: Dr. Madilyn Fireman, MD    First Contact: Dr. Albin Felling Pager: [phone]  Second Contact: Dr. Clinton Gallant Pager: [phone]       After Hours (After 5p/  First Contact Pager: [phone]  weekends / holidays): Second Contact Pager: [phone]   Chief Complaint: Facial abscess  History of Present Illness: Michael [name] is a 63yo man w/ PMHx of DM Type 2 w/ peripheral neuropathy (last HbA1c 7.5 on 01/30/14), HTN, 3 vessel CAD (s/p stents to proximal and distal LAD- 06/2013), HPL, HIV (last CD4 270 on 01/28/14 and VL < 20 on 02/03/14), and hx of cocaine abuse who presented to the ED with a left facial abscess. Pt reports abscess appeared on 04/25/14 and continued to worsen. Pt went to [facility] ED on 04/27/14 where he received an I&D. Pt states the abscess did not heal afterwards and continued to enlarge and drain pus, becoming more tender and swollen. He reports he squeezed pus out of the wound daily. Pt denies fever, chills, but admits to difficulty chewing food and tenderness to the affected area. In the ED, a CT Maxillofacial was performed that showed a 1.9 x 1.4 cm collection of fluid overlying left lateral mandible w/ significant inflammation compatible with a focal abscess. There was also a smaller abscess (0.7 x 0.3 cm) noted more anteriorly to the large abscess. Patient received another I&D in the ED as well as 1 dose of Vanc and Zosyn. Patient reports some relief from the I&D.  Patient has hx of uncontrolled DM Type 2. Patient presented with blood glucose 575. He was given Novolog 10 units in the ED. Blood glucose now 208.    Meds: Current Facility-Administered Medications  Medication Dose Route Frequency Provider Last Rate Last Dose  . 0.9 %  sodium chloride infusion    Intravenous STAT Julianne Rice, MD 100 mL/hr at 05/01/14 [phone]    . albuterol (PROVENTIL) (2.5 MG/3ML) 0.083% nebulizer solution 3 mL  3 mL Inhalation Q6H PRN Clinton Gallant, MD      . allopurinol (ZYLOPRIM) tablet 200 mg  200 mg Oral q morning - 10a Clinton Gallant, MD      . amantadine (SYMMETREL) capsule 100 mg  100 mg Oral Daily Clinton Gallant, MD      . aspirin chewable tablet 81 mg  81 mg Oral Daily Clinton Gallant, MD      . clopidogrel (PLAVIX) tablet 75 mg  75 mg Oral Q breakfast Clinton Gallant, MD      . diltiazem (CARDIZEM CD) 24 hr capsule 120 mg  120 mg Oral Daily Clinton Gallant, MD      . DULoxetine (CYMBALTA) DR capsule 60 mg  60 mg Oral Daily Clinton Gallant, MD      . Emtricitab-Rilpivir-Tenofovir 200-25-300 MG TABS 1 tablet  1 tablet Oral Daily Clinton Gallant, MD      . gabapentin (NEURONTIN) capsule 300 mg  300 mg Oral BID Clinton Gallant, MD      . heparin injection 5,000 Units  5,000 Units Subcutaneous 3 times per day Clinton Gallant, MD      . HYDROcodone-acetaminophen (NORCO/VICODIN) 5-325 MG per  tablet 1-2 tablet  1-2 tablet Oral Q4H PRN Clinton Gallant, MD      . insulin aspart (novoLOG) injection 0-15 Units  0-15 Units Subcutaneous TID WC Clinton Gallant, MD      . insulin glargine (LANTUS) injection 30 Units  30 Units Subcutaneous QHS Clinton Gallant, MD      . lisinopril (PRINIVIL,ZESTRIL) tablet 5 mg  5 mg Oral Daily Clinton Gallant, MD      . metoprolol succinate (TOPROL-XL) 24 hr tablet 25 mg  25 mg Oral QHS Clinton Gallant, MD      . ondansetron [facility]) tablet 4 mg  4 mg Oral Q6H PRN Clinton Gallant, MD       Or  . ondansetron [facility]) injection 4 mg  4 mg Intravenous Q6H PRN Clinton Gallant, MD      . piperacillin-tazobactam (ZOSYN) IVPB 3.375 g  3.375 g Intravenous 3 times per day Jaquita Folds, RPH      . simvastatin (ZOCOR) tablet 10 mg  10 mg Oral q1800 Clinton Gallant, MD      . vancomycin (VANCOCIN) 1,250 mg in sodium chloride 0.9 % 250 mL IVPB  1,250 mg Intravenous Q24H Jaquita Folds, RPH        Allergies: Allergies as of  04/30/2014  . (No Known Allergies)   Past Medical History  Diagnosis Date  . Diabetes mellitus     Diagnosed in 2002, started insulin in 2012  . Hypertension   . Headache(784.0)     CT head 08/2011: Periventricular and subcortical white matter hypodensities are most in keeping with chronic microangiopathic change  . Gout   . Hyperlipidemia   . HIV infection Nov 2012    Followed by [name]  . Crack cocaine use     for 20+ years, has been enrolled in detox programs in the past  . Chondromalacia of medial femoral condyle     Left knee MRI 04/28/12: Chondromalacia of the medial femoral condyle with slight peripheral degeneration of the meniscocapsular junction of the medial meniscus; followed by sports medicine  . Asthma     No PFTs, history of childhood asthma  . Depression     with history of hospitalization for suicidal ideation  . CAD (coronary artery disease) 06/10/2013    Presented as STEMI 06/10/13; s/p BMS to LAD; done at [facility] (LAD prox 95% with thrombus, mid 75%, D2 75%; LCX OM2 75%; RCA small, PDA 95%, PLV 95%)  . Gout 04/28/2012  . Collagen vascular disease    Past Surgical History  Procedure Laterality Date  . Prostate surgery    . Bowel resection    . Back surgery      1988  . Cardiac surgery     Family History  Problem Relation Age of Onset  . Diabetes Mother   . Diabetes Father   . Diabetes Brother   . Colon cancer Neg Hx    History   Social History  . Marital Status: Divorced    Spouse Name: N/A    Number of Children: N/A  . Years of Education: N/A   Occupational History  . Not on file.   Social History Main Topics  . Smoking status: Never Smoker   . Smokeless tobacco: Never Used  . Alcohol Use: No  . Drug Use: Yes    Special: "Crack" cocaine, Cocaine  . Sexual Activity: No     Comment: accepted condoms   Other Topics Concern  . Not on file  Social History Narrative  . No narrative on file    Review of Systems: General: Denies  changes in weight, fatigue HEENT: Denies headache, changes in vision, ear pain, rhinorrhea, sore throat CV: Denies CP, SOB, palpitations, orthopnea Pulm: Denies SOB, cough, wheezing GI: Denies abdominal pain, diarrhea, constipation, melena, hematochezia GU: Denies dysuria, hematuria, frequency, urgency Msk: Denies muscle cramps, joint pain, decreased ROM Skin: Denies rashes, skin cancer  Physical Exam: Blood pressure 148/78, pulse 86, temperature 98.7 F (37.1 C), temperature source Oral, resp. rate 18, height 5' 7.32" (1.71 m), weight 173 lb 11.6 oz (78.8 kg), SpO2 100.00%. General: sitting up in bed, alert, cooperative, NAD HEENT: [place], has bandage covering left side of face. Pt has 2 cm open wound draining bloody fluid. Tenderness and erythema in surrounding area. Right side of face normal. Inside mouth, pt has lesion in left cheek that is swollen. Throat non-erythematous. EOMI, PERRL.  Neck: supple, no JVD CV: irregularly irregular rate, no m/g/r  Pulm: CTA bilaterally, breaths non-labored Abd: BS+, soft, nondistended, nontender Ext: warm, no edema, moves all   Lab results: Basic Metabolic Panel:  Recent Labs  04/30/14 2258  NA 134*  K 4.2  CL 97  CO2 23  GLUCOSE 606*  BUN 21  CREATININE 1.32  CALCIUM 8.8   Liver Function Tests:  Recent Labs  04/30/14 2258  AST 12  ALT 12  ALKPHOS 80  BILITOT <0.2*  PROT 7.0  ALBUMIN 3.2*   No results found for this basename: LIPASE, AMYLASE,  in the last 72 hours No results found for this basename: AMMONIA,  in the last 72 hours CBC:  Recent Labs  04/30/14 2258  WBC 4.4  NEUTROABS 3.0  HGB 10.7*  HCT 33.4*  MCV 84.8  PLT 170   Cardiac Enzymes: No results found for this basename: CKTOTAL, CKMB, CKMBINDEX, TROPONINI,  in the last 72 hours BNP: No results found for this basename: PROBNP,  in the last 72 hours D-Dimer: No results found for this basename: DDIMER,  in the last 72 hours CBG:  Recent Labs   04/30/14 2240 05/01/14 0453 05/01/14 0634 05/01/14 0731  GLUCAP 575* 354* 208* 195*   Hemoglobin A1C: No results found for this basename: HGBA1C,  in the last 72 hours Fasting Lipid Panel: No results found for this basename: CHOL, HDL, LDLCALC, TRIG, CHOLHDL, LDLDIRECT,  in the last 72 hours Thyroid Function Tests: No results found for this basename: TSH, T4TOTAL, FREET4, T3FREE, THYROIDAB,  in the last 72 hours Anemia Panel: No results found for this basename: VITAMINB12, FOLATE, FERRITIN, TIBC, IRON, RETICCTPCT,  in the last 72 hours Coagulation: No results found for this basename: LABPROT, INR,  in the last 72 hours Urine Drug Screen: Drugs of Abuse     Component Value Date/Time   LABOPIA NONE DETECTED 04/24/2014 1410   COCAINSCRNUR POSITIVE* 04/24/2014 1410   LABBENZ NONE DETECTED 04/24/2014 1410   AMPHETMU NONE DETECTED 04/24/2014 1410   THCU NONE DETECTED 04/24/2014 1410   LABBARB NONE DETECTED 04/24/2014 1410    Alcohol Level: No results found for this basename: ETH,  in the last 72 hours Urinalysis: No results found for this basename: COLORURINE, APPERANCEUR, LABSPEC, PHURINE, GLUCOSEU, HGBUR, BILIRUBINUR, KETONESUR, PROTEINUR, UROBILINOGEN, NITRITE, LEUKOCYTESUR,  in the last 72 hours   Imaging results:  Ct Maxillofacial W/cm  05/01/2014   CLINICAL DATA:  Large left-sided cheek abscess.  EXAM: CT MAXILLOFACIAL WITH CONTRAST  TECHNIQUE: Multidetector CT imaging of the maxillofacial structures was performed with intravenous  contrast. Multiplanar CT image reconstructions were also generated. A small metallic BB was placed on the right temple in order to reliably differentiate right from left.  CONTRAST:  32mL OMNIPAQUE IOHEXOL 300 MG/ML  SOLN  COMPARISON:  CT of the head and cervical spine performed 04/25/2014  FINDINGS: There is a 1.9 x 1.4 cm collection of fluid within the subcutaneous soft tissues overlying the left lateral mandible, with minimal peripheral enhancement and  significant associated soft tissue inflammation. An associated skin defect is seen. Soft tissue inflammation extends inferiorly over the left mandible and adjacent to the left parotid gland. A tiny subcutaneous collection of fluid is noted more anteriorly and inferiorly, measuring approximately 0.7 x 0.3 cm, with mild associated soft tissue inflammation.  There is no evidence of fracture or dislocation. The maxilla and mandible appear intact. The nasal bone is unremarkable in appearance. The visualized dentition demonstrates no acute abnormality. There is chronic absence of multiple maxillary and mandibular teeth. There is chronic osseous fusion at C4-C5, with associated degenerative change.  The orbits are intact bilaterally. The visualized paranasal sinuses and mastoid air cells are well-aerated.  The parapharyngeal fat planes are preserved. The nasopharynx, oropharynx and hypopharynx are unremarkable in appearance. The visualized portions of the valleculae and piriform sinuses are grossly unremarkable.  The parotid and submandibular glands are otherwise within normal limits. No cervical lymphadenopathy is seen. The visualized portions of the brain are unremarkable in appearance.  IMPRESSION: 1. 1.9 x 1.4 cm collection of fluid within the subcutaneous tissues overlying the left lateral mandible, with minimal peripheral enhancement and significant associated soft tissue inflammation, compatible with a focal abscess. Associated skin defect seen. Soft tissue inflammation extends inferiorly over the left mandible and adjacent to the left parotid gland. 2. Tiny subcutaneous collection of fluid more anteriorly and inferiorly, measuring 0.7 x 0.3 cm, with mild associated soft tissue inflammation, compatible with a tiny evolving abscess. 3. Chronic osseous fusion at C4-C5, with associated degenerative change.   Electronically Signed   By: Garald Balding M.D.   On: 05/01/2014 04:46    Other results: EKG: Sinus  arrhythmia, changed from previous EKG on 02/24/14.   Assessment & Plan: Michael [name] is a 63yo man w/ PMHx of DM Type 2 w/ peripheral neuropathy (last HbA1c 7.5 on 01/30/14), HTN, 3 vessel CAD (s/p stents to proximal and distal LAD- 06/2013), HPL, HIV (last CD4 270 on 01/28/14 and VL < 20 on 02/03/14), and hx of cocaine abuse who presented to the ED with a left facial abscess.  1. Left Facial Abscess: Pt reported abscess started on 7/20 and continued to enlarge. He went to [facility] ED on 04/27/14 where he received an I&D. Pt states wound continued to drain pus and became tender/swollen again. Pt received another I&D in the ED this AM and received 1 dose of Vanc and Zosyn. Pt reports the I&D provided some relief. Pt's abscess likely not healing well due to his uncontrolled DM and cocaine abuse. Blood glucose 575 on admission, UDS +cocaine. WBC 4.4 (suppressed due to HIV). Pt denies any fever, chills, but admits to difficulty chewing and tenderness to affected area. - Continue Vanc and Zosyn - Norco Q4H PRN moderate pain - Continue to monitor wound - CBC/CMP tomorrow AM - CBGs 4 times daily - Novolog ISS - Lantus 30 units QHS  2. New Sinus Arrhythmia: Irregularly irregular rate noted on exam. EKG showed sinus rhythm which is different from previous EKGs. EKG shows irregular pauses but normal waveforms.  -  Continue to monitor - Cardiac monitoring  3. DM Type 2 w/ Peripheral Neuropathy: Last HbA1c 7.5 on 01/30/14. Blood glucose 575 on admission, now 208. Pt takes Lantus 30 units QHS,  Metformin 1,000 mg BID, and Gabapentin 300 mg TID at home.  - Repeat HbA1c - CBGs 4 times daily - Novolog ISS - Lantus 30 units QHS - Gabapentin 300 mg BID  4. HTN: Pt takes Cardizem 120 mg daily, Lisinopril 5 mg daily, and Metoprolol 25 mg QHS at home. - Continue home meds  5. 3 Vessel CAD s/p stents (06/2013): Stable. Pt denies CP, SOB, palpitations. Pt was hospitalized from 06/10/13-06/12/13 after having an MI and now s/p  BMS to proximal and distal LAD. Cath showed LM nl; LAD prox 95% with thrombus, mid 75%, D2 75%, LCX OM2 75%, RCA small, PDA 95%, PLV 95%. Pt on Cardizem 120 mg daily, Lisinopril 5 mg daily, Metoprolol 25 mg QHS, Aspirin 81 mg daily, Plavix 75 mg daily, and Pravastatin 40 mg daily. - Continue home meds  6. HPL: Pt takes Pravastatin 40 mg daily at home. - Continue Pravastatin  7. HIV: Last CD4 270 on 01/28/14 and VL < 20 on 02/03/14. Pt takes Complera (Emtricitab, Rilpivir, Tenofovir) at home. - Continue home meds  8. Hx cocaine abuse: Pt has hx of cocaine abuse. Urine drug screen +cocaine on admission. Last ED admission, patient counseled on rehab facilities and risks of cocaine abuse. - Educate pt on risks of cocaine abuse  Dispo: Disposition is deferred at this time, awaiting improvement of current medical problems. Anticipated discharge in approximately 1-2 day(s).   The patient does have a current PCP (Neema Bobbie Stack, MD) and does need an [facility] hospital follow-up appointment after discharge.  The patient does not have transportation limitations that hinder transportation to clinic appointments.  Signed: Albin Felling, MD 05/01/2014, 10:00 AM

## 2014-05-01 NOTE — Progress Notes (Signed)
ANTIBIOTIC CONSULT NOTE - INITIAL  Pharmacy Consult for Vancomycin and Zosyn Indication: Cellulitis  No Known Allergies  Patient Measurements: Height: 5' 7.32" (171 cm) Weight: 173 lb 11.6 oz (78.8 kg) IBW/kg (Calculated) : 66.84 Adjusted Body Weight:   Vital Signs: Temp: 98.7 F (37.1 C) (07/26 0706) Temp src: Oral (07/26 0706) BP: 148/78 mmHg (07/26 0706) Pulse Rate: 86 (07/26 0706) Intake/Output from previous day:   Intake/Output from this shift:    Labs:  Recent Labs  04/30/14 2258  WBC 4.4  HGB 10.7*  PLT 170  CREATININE 1.32   Estimated Creatinine Clearance: 53.4 ml/min (by C-G formula based on Cr of 1.32). No results found for this basename: VANCOTROUGH, VANCOPEAK, VANCORANDOM, GENTTROUGH, GENTPEAK, GENTRANDOM, TOBRATROUGH, TOBRAPEAK, TOBRARND, AMIKACINPEAK, AMIKACINTROU, AMIKACIN,  in the last 72 hours   Microbiology: No results found for this or any previous visit (from the past 720 hour(s)).  Medical History: Past Medical History  Diagnosis Date  . Diabetes mellitus     Diagnosed in 2002, started insulin in 2012  . Hypertension   . Headache(784.0)     CT head 08/2011: Periventricular and subcortical white matter hypodensities are most in keeping with chronic microangiopathic change  . Gout   . Hyperlipidemia   . HIV infection Nov 2012    Followed by [name]  . Crack cocaine use     for 20+ years, has been enrolled in detox programs in the past  . Chondromalacia of medial femoral condyle     Left knee MRI 04/28/12: Chondromalacia of the medial femoral condyle with slight peripheral degeneration of the meniscocapsular junction of the medial meniscus; followed by sports medicine  . Asthma     No PFTs, history of childhood asthma  . Depression     with history of hospitalization for suicidal ideation  . CAD (coronary artery disease) 06/10/2013    Presented as STEMI 06/10/13; s/p BMS to LAD; done at [facility] (LAD prox 95% with thrombus, mid 75%, D2  75%; LCX OM2 75%; RCA small, PDA 95%, PLV 95%)  . Gout 04/28/2012  . Collagen vascular disease     Medications:  Scheduled:  . sodium chloride   Intravenous STAT  . allopurinol  200 mg Oral q morning - 10a  . amantadine  100 mg Oral Daily  . aspirin  81 mg Oral Daily  . clopidogrel  75 mg Oral Q breakfast  . diltiazem  120 mg Oral Daily  . DULoxetine  60 mg Oral Daily  . Emtricitab-Rilpivir-Tenofovir  1 tablet Oral Daily  . gabapentin  300 mg Oral BID  . heparin  5,000 Units Subcutaneous 3 times per day  . insulin aspart  0-15 Units Subcutaneous TID WC  . insulin glargine  30 Units Subcutaneous QHS  . lisinopril  5 mg Oral Daily  . metoprolol succinate  25 mg Oral QHS  . piperacillin-tazobactam (ZOSYN)  IV  3.375 g Intravenous 3 times per day  . simvastatin  10 mg Oral q1800  . vancomycin  1,250 mg Intravenous Q24H   Assessment: Michael [name] (+)HIV, (+)DM with facial cellulitis, to start Vancomycin and Zosyn.  Pt with abscess x 1 s/p I&D a few days ago but has continued to drain & swell, as well as a 2nd small evolving abscess per CT on admission.  Estimated CrCl ~14ml/min based on Cr 1.32.  He has received Vancomycin 1000mg  x 1, at ~2AM.  Goal of Therapy:  Vancomycin trough 10-15  Plan:  1-  Continue Vancomycin  1250mg  IV q24, 1st dose at 2200 2-  Zosyn 3.375g IV q8, infuse over 4h 3-  Trend renal fxn, f/u any cx data, and check ss trough when appropriate  Gracy Bruins, PharmD [facility]

## 2014-05-01 NOTE — Progress Notes (Signed)
Pt placed on telemetry and Central Monitoring called and notified.

## 2014-05-02 ENCOUNTER — Encounter (HOSPITAL_COMMUNITY): Payer: Self-pay | Admitting: General Practice

## 2014-05-02 DIAGNOSIS — I10 Essential (primary) hypertension: Secondary | ICD-10-CM

## 2014-05-02 DIAGNOSIS — E785 Hyperlipidemia, unspecified: Secondary | ICD-10-CM

## 2014-05-02 DIAGNOSIS — I498 Other specified cardiac arrhythmias: Secondary | ICD-10-CM

## 2014-05-02 DIAGNOSIS — Z21 Asymptomatic human immunodeficiency virus [HIV] infection status: Secondary | ICD-10-CM

## 2014-05-02 DIAGNOSIS — I2510 Atherosclerotic heart disease of native coronary artery without angina pectoris: Secondary | ICD-10-CM

## 2014-05-02 LAB — COMPREHENSIVE METABOLIC PANEL
ALT: 9 U/L (ref 0–53)
ANION GAP: 11 (ref 5–15)
AST: 12 U/L (ref 0–37)
Albumin: 2.5 g/dL — ABNORMAL LOW (ref 3.5–5.2)
Alkaline Phosphatase: 64 U/L (ref 39–117)
BUN: 11 mg/dL (ref 6–23)
CO2: 24 mEq/L (ref 19–32)
Calcium: 8.4 mg/dL (ref 8.4–10.5)
Chloride: 107 mEq/L (ref 96–112)
Creatinine, Ser: 0.96 mg/dL (ref 0.50–1.35)
GFR calc Af Amer: >90 mL/min (ref >90)
GFR calc non Af Amer: 86 mL/min — ABNORMAL LOW (ref >90)
Glucose, Bld: 95 mg/dL (ref 70–99)
Potassium: 3.6 mEq/L — ABNORMAL LOW (ref 3.7–5.3)
Sodium: 142 mEq/L (ref 137–147)
TOTAL PROTEIN: 5.8 g/dL — AB (ref 6.0–8.3)

## 2014-05-02 LAB — CBC
HEMATOCRIT: 30.2 % — AB (ref 39.0–52.0)
HEMOGLOBIN: 9.9 g/dL — AB (ref 13.0–17.0)
MCH: 27.5 pg (ref 26.0–34.0)
MCHC: 32.8 g/dL (ref 30.0–36.0)
MCV: 83.9 fL (ref 78.0–100.0)
Platelets: 159 10*3/uL (ref 150–400)
RBC: 3.6 MIL/uL — ABNORMAL LOW (ref 4.22–5.81)
RDW: 12.5 % (ref 11.5–15.5)
WBC: 2.9 10*3/uL — AB (ref 4.0–10.5)

## 2014-05-02 LAB — GLUCOSE, CAPILLARY: Glucose-Capillary: 119 mg/dL — ABNORMAL HIGH (ref 70–99)

## 2014-05-02 MED ORDER — VANCOMYCIN HCL IN DEXTROSE 750-5 MG/150ML-% IV SOLN
750.0000 mg | Freq: Two times a day (BID) | INTRAVENOUS | Status: DC
  Filled 2014-05-02 (×2): qty 150

## 2014-05-02 MED ORDER — CIPROFLOXACIN IN D5W 400 MG/200ML IV SOLN
400.0000 mg | Freq: Two times a day (BID) | INTRAVENOUS | Status: DC
  Filled 2014-05-02: qty 200

## 2014-05-02 MED ORDER — SULFAMETHOXAZOLE-TMP DS 800-160 MG PO TABS
1.0000 | ORAL_TABLET | Freq: Two times a day (BID) | ORAL | Status: DC
  Administered 2014-05-02: 1 via ORAL
  Filled 2014-05-02: qty 1

## 2014-05-02 MED ORDER — CIPROFLOXACIN HCL 500 MG PO TABS: 500.0000 mg | ORAL_TABLET | Freq: Two times a day (BID) | ORAL | Status: DC

## 2014-05-02 MED ORDER — SULFAMETHOXAZOLE-TMP DS 800-160 MG PO TABS: 1.0000 | ORAL_TABLET | Freq: Two times a day (BID) | ORAL | Status: DC

## 2014-05-02 MED ORDER — SULFAMETHOXAZOLE-TRIMETHOPRIM 400-80 MG/5ML IV SOLN
200.0000 mg | Freq: Three times a day (TID) | INTRAVENOUS | Status: DC
  Filled 2014-05-02 (×2): qty 12.5

## 2014-05-02 MED ORDER — CIPROFLOXACIN HCL 500 MG PO TABS
500.0000 mg | ORAL_TABLET | Freq: Two times a day (BID) | ORAL | Status: DC
Start: 2014-05-02 — End: 2014-05-02
  Administered 2014-05-02: 500 mg via ORAL
  Filled 2014-05-02: qty 1

## 2014-05-02 NOTE — Discharge Summary (Signed)
.   Name: Michael [name] MRN: [id number] DOB: Jan 18, 1951 63 y.o. PCP: Othella Boyer, MD  Date of Admission: 05/01/2014  1:43 AM Date of Discharge: 05/02/2014 Attending Physician: Madilyn Fireman, MD  Discharge Diagnosis: 1. Left Facial Abscess 2. New Sinus Arrhythmia  3. DM Type 2 w/ Peripheral Neuropathy 4. HTN 5. H/o 3 Vessel CAD s/p stents (06/2013) 6. HPL 7. HIV 8. H/o cocaine abuse   Discharge Medications:   Medication List         albuterol 108 (90 BASE) MCG/ACT inhaler  Commonly known as:  PROVENTIL HFA;VENTOLIN HFA  Inhale 1 puff into the lungs every 6 (six) hours as needed for wheezing or shortness of breath.     allopurinol 100 MG tablet  Commonly known as:  ZYLOPRIM  Take 2 tablets (200 mg total) by mouth every morning. For gout.     amantadine 100 MG capsule  Commonly known as:  SYMMETREL  Take 100 mg by mouth daily.     aspirin 81 MG chewable tablet  Chew 81 mg by mouth daily. For blood thinner.     benzonatate 100 MG capsule  Commonly known as:  TESSALON  Take 2 capsules (200 mg total) by mouth 2 (two) times daily as needed for cough.     ciprofloxacin 500 MG tablet  Commonly known as:  CIPRO  Take 1 tablet (500 mg total) by mouth every 12 (twelve) hours.     clopidogrel 75 MG tablet  Commonly known as:  PLAVIX  Take 75 mg by mouth daily with breakfast.     diclofenac sodium 1 % Gel  Commonly known as:  VOLTAREN  Apply 2 g topically 2 (two) times daily as needed (pain).     diltiazem 120 MG 24 hr capsule  Commonly known as:  CARDIZEM CD  Take 120 mg by mouth daily. Take 1 capsule (120 mg total) by mouth daily.     DULoxetine 60 MG capsule  Commonly known as:  CYMBALTA  Take 1 capsule (60 mg total) by mouth daily.     Emtricitab-Rilpivir-Tenofovir 200-25-300 MG Tabs  Commonly known as:  COMPLERA  Take 1 tablet by mouth daily.     gabapentin 300 MG capsule  Commonly known as:  NEURONTIN  Take 300 mg by mouth 3 (three) times daily.     HYDROcodone-homatropine 5-1.5 MG/5ML syrup  Commonly known as:  HYCODAN  Take 5 mLs by mouth every 6 (six) hours as needed for cough.     insulin glargine 100 UNIT/ML injection  Commonly known as:  LANTUS  Inject 30 units daily before bed     ipratropium-albuterol 0.5-2.5 (3) MG/3ML Soln  Commonly known as:  DUONEB  Take 3 mLs by nebulization every 2 (two) hours as needed (shortness of breath).     lisinopril 5 MG tablet  Commonly known as:  PRINIVIL,ZESTRIL  Take 1 tablet (5 mg total) by mouth daily.     LORazepam 0.5 MG tablet  Commonly known as:  ATIVAN  Take 0.5 mg by mouth every 8 (eight) hours as needed for anxiety.     metFORMIN 1000 MG tablet  Commonly known as:  GLUCOPHAGE  Take 1 tablet (1,000 mg total) by mouth 2 (two) times daily with a meal.     metoprolol succinate 25 MG 24 hr tablet  Commonly known as:  TOPROL-XL  Take 25 mg by mouth at bedtime.     nitroGLYCERIN 0.4 MG SL tablet  Commonly known as:  NITROSTAT  0.4 mg. Place 1 tablet (0.4 mg total) under the tongue every 5 (five) minutes as needed for Chest pain.     pravastatin 40 MG tablet  Commonly known as:  PRAVACHOL  Take 40 mg by mouth every evening.     sulfamethoxazole-trimethoprim 800-160 MG per tablet  Commonly known as:  BACTRIM DS  Take 1 tablet by mouth every 12 (twelve) hours.     traMADol 50 MG tablet  Commonly known as:  ULTRAM  Take 1 tablet (50 mg total) by mouth every 6 (six) hours as needed for pain.        Disposition and follow-up:   [name] was discharged from [facility] in Good condition.  At the hospital follow up visit please address:  1.  Left facial abscess/completion of antibiotics. Diabetes control (HbA1c 11.5)  2.  Labs / imaging needed at time of follow-up: None  3.  Pending labs/ test needing follow-up: None  Follow-up Appointments: Follow-up Information   Please follow up. (July 31st at 3:15 PM at IM clinic)       Follow up  with [place]             . (Monday, August 3rd @ 8:15AM)    Contact information:   [address] [id number]      Discharge Instructions: Discharge Instructions   Diet - low sodium heart healthy    Complete by:  As directed      Increase activity slowly    Complete by:  As directed            Consultations:    Procedures Performed:  Ct Maxillofacial W/cm  05/01/2014   CLINICAL DATA:  Large left-sided cheek abscess.  EXAM: CT MAXILLOFACIAL WITH CONTRAST  TECHNIQUE: Multidetector CT imaging of the maxillofacial structures was performed with intravenous contrast. Multiplanar CT image reconstructions were also generated. A small metallic BB was placed on the right temple in order to reliably differentiate right from left.  CONTRAST:  21mL OMNIPAQUE IOHEXOL 300 MG/ML  SOLN  COMPARISON:  CT of the head and cervical spine performed 04/25/2014  FINDINGS: There is a 1.9 x 1.4 cm collection of fluid within the subcutaneous soft tissues overlying the left lateral mandible, with minimal peripheral enhancement and significant associated soft tissue inflammation. An associated skin defect is seen. Soft tissue inflammation extends inferiorly over the left mandible and adjacent to the left parotid gland. A tiny subcutaneous collection of fluid is noted more anteriorly and inferiorly, measuring approximately 0.7 x 0.3 cm, with mild associated soft tissue inflammation.  There is no evidence of fracture or dislocation. The maxilla and mandible appear intact. The nasal bone is unremarkable in appearance. The visualized dentition demonstrates no acute abnormality. There is chronic absence of multiple maxillary and mandibular teeth. There is chronic osseous fusion at C4-C5, with associated degenerative change.  The orbits are intact bilaterally. The visualized paranasal sinuses and mastoid air cells are well-aerated.  The parapharyngeal fat  planes are preserved. The nasopharynx, oropharynx and hypopharynx are unremarkable in appearance. The visualized portions of the valleculae and piriform sinuses are grossly unremarkable.  The parotid and submandibular glands are otherwise within normal limits. No cervical lymphadenopathy is seen. The visualized portions of the brain are unremarkable in appearance.  IMPRESSION: 1. 1.9 x 1.4 cm collection of fluid within the subcutaneous tissues overlying the left lateral mandible, with minimal peripheral enhancement and significant associated  soft tissue inflammation, compatible with a focal abscess. Associated skin defect seen. Soft tissue inflammation extends inferiorly over the left mandible and adjacent to the left parotid gland. 2. Tiny subcutaneous collection of fluid more anteriorly and inferiorly, measuring 0.7 x 0.3 cm, with mild associated soft tissue inflammation, compatible with a tiny evolving abscess. 3. Chronic osseous fusion at C4-C5, with associated degenerative change.   Electronically Signed   By: Garald Balding M.D.   On: 05/01/2014 04:46     Admission HPI: Mr. Wyffels is a 63yo man w/ PMHx of DM Type 2 w/ peripheral neuropathy (last HbA1c 7.5 on 01/30/14), HTN, 3 vessel CAD (s/p stents to proximal and distal LAD- 06/2013), HPL, HIV (last CD4 270 on 01/28/14 and VL < 20 on 02/03/14), and hx of cocaine abuse who presented to the ED with a left facial abscess. Pt reports abscess appeared on 04/25/14 and continued to worsen. Pt went to [facility] ED on 04/27/14 where he received an I&D. Pt states the abscess did not heal afterwards and continued to enlarge and drain pus, becoming more tender and swollen. He reports he squeezed pus out of the wound daily. Pt denies fever, chills, but admits to difficulty chewing food and tenderness to the affected area. In the ED, a CT Maxillofacial was performed that showed a 1.9 x 1.4 cm collection of fluid overlying left lateral mandible w/ significant inflammation  compatible with a focal abscess. There was also a smaller abscess (0.7 x 0.3 cm) noted more anteriorly to the large abscess. Patient received another I&D in the ED as well as 1 dose of Vanc and Zosyn. Patient reports some relief from the I&D.   Patient has hx of uncontrolled DM Type 2. Patient presented with blood glucose 575. He was given Novolog 10 units in the ED. Blood glucose now 208.   Hospital Course by problem list:   1. Left Facial Abscess: Pt reported abscess started on 7/20 and continued to enlarge. He went to [facility] ED on 04/27/14 where he received an I&D. Pt states wound continued to drain pus and became tender/swollen again. Pt received another I&D in the ED this AM and received 1 dose of Vanc and Zosyn. Pt reports the I&D provided some relief. Pt's abscess likely not healing well due to his uncontrolled DM and cocaine abuse. Blood glucose 575 on admission, UDS +cocaine. WBC 4.4 (suppressed due to HIV). Pt started on Vancomycin 1g IV and Zosyn 3.375 g IV. Pt then transitioned to PO antibiotics, Bactrim 800-160 mg Q12H and Ciprofloxacin 500 mg Q12H to be completed for 10 days (end date: 05/11/14). Pt has outpatient follow-up on 05/06/14 in IM clinic and [facility] appointment on August 3rd at 8:15AM.   2. New Sinus Arrhythmia: Pt noted to have irregularly irregular rhythm noted on exam. EKG showed sinus arrhythmia which is different from previous EKGs. EKG shows irregular pauses but normal waveforms. Monitored during hospitalization.   3. DM Type 2 w/ Peripheral Neuropathy: Last HbA1c 7.5 on 01/30/14. Blood glucose 575 on admission, now in 120s-200s. Pt takes Lantus 30 units QHS, Metformin 1,000 mg BID, and Gabapentin 300 mg TID at home. Repeat HbA1c was 11.5. Patient placed on Lantus 30 units QHS and Gabapentin 300 mg BID.   4. HTN: Pt takes Cardizem 120 mg daily, Lisinopril 5 mg daily, and Metoprolol 25 mg QHS at home. BP stable. Home medications continued.  5. H/o 3 Vessel CAD s/p stents  (06/2013): Stable. Pt denies CP, SOB, palpitations. Pt  was hospitalized from 06/10/13-06/12/13 after having an MI and now s/p BMS to proximal and distal LAD. Cath showed LM nl; LAD prox 95% with thrombus, mid 75%, D2 75%, LCX OM2 75%, RCA small, PDA 95%, PLV 95%. Pt on Cardizem 120 mg daily, Lisinopril 5 mg daily, Metoprolol 25 mg QHS, Aspirin 81 mg daily, Plavix 75 mg daily, and Pravastatin 40 mg daily. Home meds continued.  6. HPL: Pt takes Pravastatin 40 mg daily at home, this was continued.  7. HIV: Last CD4 270 on 01/28/14 and VL < 20 on 02/03/14. Pt takes Complera (Emtricitab, Rilpivir, Tenofovir) at home. This was continued.  8. H/o cocaine abuse: Pt has hx of cocaine abuse. Urine drug screen +cocaine on admission. Last ED admission, patient counseled on rehab facilities and risks of cocaine abuse. Pt was again educated on risks of cocaine abuse.  Discharge Vitals:   BP 124/70  Pulse 85  Temp(Src) 97.6 F (36.4 C) (Oral)  Resp 17  Ht 5' 7.32" (1.71 m)  Wt 173 lb 11.6 oz (78.8 kg)  BMI 26.95 kg/m2  SpO2 97% Physical Exam  General: sitting up in bed, alert, cooperative, NAD  HEENT: [place], has bandage covering left side of face. Pt has 2 cm open wound draining bloody fluid. Tenderness in surrounding area. Right side of face normal. Throat non-erythematous. EOMI, PERRL.  Neck: supple, no JVD  CV: irregularly irregular rate and rhythm, no m/g/r  Pulm: CTA bilaterally, breaths non-labored  Abd: BS+, soft, nondistended, nontender  Ext: warm, no edema, moves all  Discharge Labs:  Results for orders placed during the hospital encounter of 05/01/14 (from the past 24 hour(s))  GLUCOSE, CAPILLARY     Status: Abnormal   Collection Time    05/01/14  4:32 PM      Result Value Ref Range   Glucose-Capillary 212 (*) 70 - 99 mg/dL  GLUCOSE, CAPILLARY     Status: Abnormal   Collection Time    05/01/14 10:31 PM      Result Value Ref Range   Glucose-Capillary 211 (*) 70 - 99 mg/dL   Comment 1 Notify  RN     Comment 2 Documented in Chart    COMPREHENSIVE METABOLIC PANEL     Status: Abnormal   Collection Time    05/02/14  6:20 AM      Result Value Ref Range   Sodium 142  137 - 147 mEq/L   Potassium 3.6 (*) 3.7 - 5.3 mEq/L   Chloride 107  96 - 112 mEq/L   CO2 24  19 - 32 mEq/L   Glucose, Bld 95  70 - 99 mg/dL   BUN 11  6 - 23 mg/dL   Creatinine, Ser 0.96  0.50 - 1.35 mg/dL   Calcium 8.4  8.4 - 10.5 mg/dL   Total Protein 5.8 (*) 6.0 - 8.3 g/dL   Albumin 2.5 (*) 3.5 - 5.2 g/dL   AST 12  0 - 37 U/L   ALT 9  0 - 53 U/L   Alkaline Phosphatase 64  39 - 117 U/L   Total Bilirubin <0.2 (*) 0.3 - 1.2 mg/dL   GFR calc non Af Amer 86 (*) >90 mL/min   GFR calc Af Amer >90  >90 mL/min   Anion gap 11  5 - 15  CBC     Status: Abnormal   Collection Time    05/02/14  6:20 AM      Result Value Ref Range   WBC 2.9 (*)  4.0 - 10.5 K/uL   RBC 3.60 (*) 4.22 - 5.81 MIL/uL   Hemoglobin 9.9 (*) 13.0 - 17.0 g/dL   HCT 30.2 (*) 39.0 - 52.0 %   MCV 83.9  78.0 - 100.0 fL   MCH 27.5  26.0 - 34.0 pg   MCHC 32.8  30.0 - 36.0 g/dL   RDW 12.5  11.5 - 15.5 %   Platelets 159  150 - 400 K/uL  GLUCOSE, CAPILLARY     Status: Abnormal   Collection Time    05/02/14  9:28 AM      Result Value Ref Range   Glucose-Capillary 119 (*) 70 - 99 mg/dL    Signed: Albin Felling, MD 05/02/2014, 2:40 PM    Services Ordered on Discharge: None Equipment Ordered on Discharge: None

## 2014-05-02 NOTE — Progress Notes (Signed)
AVS discharge instructions were given and went over with patient. Patient was also told that his prescription for cipro and bactrium were at [facility] for pt to pick up. Patient was also told the importance of taken his antibiotics everyday until gone. Patient was also given some dressings to change dressing abcess. Patient stated that he did not have any questions. Patient was also given a bus pass to go home. Staff will assist patient downstairs.

## 2014-05-02 NOTE — Progress Notes (Signed)
ANTIBIOTIC CONSULT NOTE   Pharmacy Consult for Vancomycin Indication: Cellulitis  No Known Allergies  Patient Measurements: Height: 5' 7.32" (171 cm) Weight: 173 lb 11.6 oz (78.8 kg) IBW/kg (Calculated) : 66.84 Adjusted Body Weight:   Vital Signs: Temp: 97.6 F (36.4 C) (07/27 0527) Temp src: Oral (07/27 0527) BP: 124/70 mmHg (07/27 0527) Pulse Rate: 85 (07/27 0527) Intake/Output from previous day: 07/26 0701 - 07/27 0700 In: 600 [P.O.:600] Out: 1425 [Urine:1425] Intake/Output from this shift:    Labs:  Recent Labs  04/30/14 2258 05/01/14 0950 05/02/14 0620  WBC 4.4 5.6 2.9*  HGB 10.7* 9.9* 9.9*  PLT 170 164 159  CREATININE 1.32 1.01 0.96   Estimated Creatinine Clearance: 73.4 ml/min (by C-G formula based on Cr of 0.96). No results found for this basename: VANCOTROUGH, VANCOPEAK, VANCORANDOM, GENTTROUGH, GENTPEAK, GENTRANDOM, TOBRATROUGH, TOBRAPEAK, TOBRARND, AMIKACINPEAK, AMIKACINTROU, AMIKACIN,  in the last 72 hours   Microbiology: No results found for this or any previous visit (from the past 720 hour(s)).  Medical History: Past Medical History  Diagnosis Date  . Diabetes mellitus     Diagnosed in 2002, started insulin in 2012  . Hypertension   . Headache(784.0)     CT head 08/2011: Periventricular and subcortical white matter hypodensities are most in keeping with chronic microangiopathic change  . Gout   . Hyperlipidemia   . HIV infection Nov 2012    Followed by [name]  . Crack cocaine use     for 20+ years, has been enrolled in detox programs in the past  . Chondromalacia of medial femoral condyle     Left knee MRI 04/28/12: Chondromalacia of the medial femoral condyle with slight peripheral degeneration of the meniscocapsular junction of the medial meniscus; followed by sports medicine  . Asthma     No PFTs, history of childhood asthma  . Depression     with history of hospitalization for suicidal ideation  . CAD (coronary artery disease)  06/10/2013    Presented as STEMI 06/10/13; s/p BMS to LAD; done at [facility] (LAD prox 95% with thrombus, mid 75%, D2 75%; LCX OM2 75%; RCA small, PDA 95%, PLV 95%)  . Gout 04/28/2012  . Collagen vascular disease     Medications:  Scheduled:  . allopurinol  200 mg Oral q morning - 10a  . amantadine  100 mg Oral Daily  . aspirin  81 mg Oral Daily  . clopidogrel  75 mg Oral Q breakfast  . diltiazem  120 mg Oral Daily  . DULoxetine  60 mg Oral Daily  . emtricitabine  200 mg Oral Daily   And  . rilpivirine  25 mg Oral Q breakfast   And  . tenofovir  300 mg Oral Daily  . gabapentin  300 mg Oral BID  . heparin  5,000 Units Subcutaneous 3 times per day  . insulin aspart  0-15 Units Subcutaneous TID WC  . insulin glargine  30 Units Subcutaneous QHS  . lisinopril  5 mg Oral Daily  . metoprolol succinate  25 mg Oral QHS  . piperacillin-tazobactam (ZOSYN)  IV  3.375 g Intravenous 3 times per day  . simvastatin  10 mg Oral q1800  . vancomycin  750 mg Intravenous Q12H   Assessment: 63yom (+)HIV, (+)DM with facial cellulitis, to start Vancomycin and Zosyn.  Pt with abscess x 1 s/p I&D a few days ago but has continued to drain & swell, as well as a 2nd small evolving abscess per CT on admission.  SrCr today 0.96 with CrCl ~70 ml/min  Goal of Therapy:  Vancomycin trough 10-15  Plan:  1-  Change Vancomycin to 750 mg IV q12 hours 2-  Trend renal fxn, f/u any cx data, and check ss trough when appropriate  Excell Seltzer, PharmD [facility]

## 2014-05-02 NOTE — Discharge Instructions (Signed)
It was a pleasure taking care of you, Michael [name]. Please complete your full course of antibiotics, Bactrim and Ciprofloxacin. You will have a follow-up appointment on August 5th at 10:30 AM at the IM clinic.

## 2014-05-02 NOTE — Progress Notes (Signed)
Subjective: Patient reports he is feeling better today. He states he has been able to eat without difficulty or pain with chewing. He will be started on PO antibiotics today.  Objective: Vital signs in last 24 hours: Filed Vitals:   05/01/14 0900 05/01/14 1333 05/01/14 2232 05/02/14 0527  BP:  142/76 137/66 124/70  Pulse:  76 126 85  Temp:  98.8 F (37.1 C) 98 F (36.7 C) 97.6 F (36.4 C)  TempSrc:  Oral Oral Oral  Resp:  18 16 17   Height: 5' 7.32" (1.71 m)     Weight:      SpO2:  100% 93% 97%   Weight change: -6 lb 4.4 oz (-2.847 kg)  Intake/Output Summary (Last 24 hours) at 05/02/14 1109 Last data filed at 05/02/14 0527  Gross per 24 hour  Intake    360 ml  Output   1425 ml  Net  -1065 ml   Physical Exam General: sitting up in bed, alert, cooperative, NAD  HEENT: [place], has bandage covering left side of face. Pt has 2 cm open wound draining bloody fluid. Tenderness in surrounding area. Right side of face normal. Throat non-erythematous. EOMI, PERRL.  Neck: supple, no JVD  CV: irregularly irregular rate and rhythm, no m/g/r  Pulm: CTA bilaterally, breaths non-labored  Abd: BS+, soft, nondistended, nontender  Ext: warm, no edema, moves all  Lab Results: Basic Metabolic Panel:  Recent Labs Lab 04/30/14 2258 05/01/14 0950 05/02/14 0620  NA 134*  --  142  K 4.2  --  3.6*  CL 97  --  107  CO2 23  --  24  GLUCOSE 606*  --  95  BUN 21  --  11  CREATININE 1.32 1.01 0.96  CALCIUM 8.8  --  8.4   Liver Function Tests:  Recent Labs Lab 04/30/14 2258 05/02/14 0620  AST 12 12  ALT 12 9  ALKPHOS 80 64  BILITOT <0.2* <0.2*  PROT 7.0 5.8*  ALBUMIN 3.2* 2.5*   No results found for this basename: LIPASE, AMYLASE,  in the last 168 hours No results found for this basename: AMMONIA,  in the last 168 hours CBC:  Recent Labs Lab 04/30/14 2258 05/01/14 0950 05/02/14 0620  WBC 4.4 5.6 2.9*  NEUTROABS 3.0  --   --   HGB 10.7* 9.9* 9.9*  HCT 33.4* 30.3* 30.2*    MCV 84.8 82.6 83.9  PLT 170 164 159   Cardiac Enzymes: No results found for this basename: CKTOTAL, CKMB, CKMBINDEX, TROPONINI,  in the last 168 hours BNP: No results found for this basename: PROBNP,  in the last 168 hours D-Dimer: No results found for this basename: DDIMER,  in the last 168 hours CBG:  Recent Labs Lab 05/01/14 0634 05/01/14 0731 05/01/14 1141 05/01/14 1632 05/01/14 2231 05/02/14 0928  GLUCAP 208* 195* 336* 212* 211* 119*   Hemoglobin A1C:  Recent Labs Lab 05/01/14 0950  HGBA1C 11.4*   Fasting Lipid Panel: No results found for this basename: CHOL, HDL, LDLCALC, TRIG, CHOLHDL, LDLDIRECT,  in the last 168 hours Thyroid Function Tests: No results found for this basename: TSH, T4TOTAL, FREET4, T3FREE, THYROIDAB,  in the last 168 hours Coagulation: No results found for this basename: LABPROT, INR,  in the last 168 hours Anemia Panel: No results found for this basename: VITAMINB12, FOLATE, FERRITIN, TIBC, IRON, RETICCTPCT,  in the last 168 hours Urine Drug Screen: Drugs of Abuse     Component Value Date/Time   LABOPIA NONE  DETECTED 05/01/2014 0953   COCAINSCRNUR NONE DETECTED 05/01/2014 0953   LABBENZ NONE DETECTED 05/01/2014 0953   AMPHETMU NONE DETECTED 05/01/2014 0953   THCU NONE DETECTED 05/01/2014 0953   LABBARB NONE DETECTED 05/01/2014 0953    Alcohol Level: No results found for this basename: ETH,  in the last 168 hours Urinalysis:  Recent Labs Lab 05/01/14 0953  COLORURINE YELLOW  LABSPEC 1.030  PHURINE 5.0  GLUCOSEU >1000*  HGBUR TRACE*  BILIRUBINUR NEGATIVE  KETONESUR NEGATIVE  PROTEINUR NEGATIVE  UROBILINOGEN 0.2  NITRITE NEGATIVE  LEUKOCYTESUR NEGATIVE    Micro Results: No results found for this or any previous visit (from the past 240 hour(s)). Studies/Results: Ct Maxillofacial W/cm  05/01/2014   CLINICAL DATA:  Large left-sided cheek abscess.  EXAM: CT MAXILLOFACIAL WITH CONTRAST  TECHNIQUE: Multidetector CT imaging of the  maxillofacial structures was performed with intravenous contrast. Multiplanar CT image reconstructions were also generated. A small metallic BB was placed on the right temple in order to reliably differentiate right from left.  CONTRAST:  93mL OMNIPAQUE IOHEXOL 300 MG/ML  SOLN  COMPARISON:  CT of the head and cervical spine performed 04/25/2014  FINDINGS: There is a 1.9 x 1.4 cm collection of fluid within the subcutaneous soft tissues overlying the left lateral mandible, with minimal peripheral enhancement and significant associated soft tissue inflammation. An associated skin defect is seen. Soft tissue inflammation extends inferiorly over the left mandible and adjacent to the left parotid gland. A tiny subcutaneous collection of fluid is noted more anteriorly and inferiorly, measuring approximately 0.7 x 0.3 cm, with mild associated soft tissue inflammation.  There is no evidence of fracture or dislocation. The maxilla and mandible appear intact. The nasal bone is unremarkable in appearance. The visualized dentition demonstrates no acute abnormality. There is chronic absence of multiple maxillary and mandibular teeth. There is chronic osseous fusion at C4-C5, with associated degenerative change.  The orbits are intact bilaterally. The visualized paranasal sinuses and mastoid air cells are well-aerated.  The parapharyngeal fat planes are preserved. The nasopharynx, oropharynx and hypopharynx are unremarkable in appearance. The visualized portions of the valleculae and piriform sinuses are grossly unremarkable.  The parotid and submandibular glands are otherwise within normal limits. No cervical lymphadenopathy is seen. The visualized portions of the brain are unremarkable in appearance.  IMPRESSION: 1. 1.9 x 1.4 cm collection of fluid within the subcutaneous tissues overlying the left lateral mandible, with minimal peripheral enhancement and significant associated soft tissue inflammation, compatible with a focal  abscess. Associated skin defect seen. Soft tissue inflammation extends inferiorly over the left mandible and adjacent to the left parotid gland. 2. Tiny subcutaneous collection of fluid more anteriorly and inferiorly, measuring 0.7 x 0.3 cm, with mild associated soft tissue inflammation, compatible with a tiny evolving abscess. 3. Chronic osseous fusion at C4-C5, with associated degenerative change.   Electronically Signed   By: Garald Balding M.D.   On: 05/01/2014 04:46   Medications: I have reviewed the patient's current medications. Scheduled Meds: . allopurinol  200 mg Oral q morning - 10a  . amantadine  100 mg Oral Daily  . aspirin  81 mg Oral Daily  . clopidogrel  75 mg Oral Q breakfast  . diltiazem  120 mg Oral Daily  . DULoxetine  60 mg Oral Daily  . emtricitabine  200 mg Oral Daily   And  . rilpivirine  25 mg Oral Q breakfast   And  . tenofovir  300 mg Oral Daily  .  gabapentin  300 mg Oral BID  . heparin  5,000 Units Subcutaneous 3 times per day  . insulin aspart  0-15 Units Subcutaneous TID WC  . insulin glargine  30 Units Subcutaneous QHS  . lisinopril  5 mg Oral Daily  . metoprolol succinate  25 mg Oral QHS  . simvastatin  10 mg Oral q1800   Continuous Infusions:  PRN Meds:.albuterol, HYDROcodone-acetaminophen, ondansetron (ZOFRAN) IV, ondansetron Assessment/Plan: Mr. Bruss is a 62yo man w/ PMHx of DM Type 2 w/ peripheral neuropathy (last HbA1c 7.5 on 01/30/14), HTN, 3 vessel CAD (s/p stents to proximal and distal LAD- 06/2013), HPL, HIV (last CD4 270 on 01/28/14 and VL < 20 on 02/03/14), and hx of cocaine abuse who presented to the ED with a left facial abscess.   1. Left Facial Abscess: Pt reported abscess started on 7/20 and continued to enlarge. He went to [facility] ED on 04/27/14 where he received an I&D. Pt states wound continued to drain pus and became tender/swollen again. Pt received another I&D in the ED this AM and received 1 dose of Vanc and Zosyn. Pt reports the I&D  provided some relief. Pt's abscess likely not healing well due to his uncontrolled DM and cocaine abuse. Blood glucose 575 on admission, UDS +cocaine. WBC 4.4 (suppressed due to HIV). This AM, pt denies any fever, chills, difficulty/pain with chewing. He reports tenderness to affected area. WBC 2.9 this AM.   - Discontinue Vanc and Zosyn - Start Cipro and Bactrim PO - Norco Q4H PRN moderate pain  - Continue to monitor wound  - CBGs 4 times daily  - Novolog ISS  - Lantus 30 units QHS   2. New Sinus Arrhythmia: Irregularly irregular rate noted on exam. EKG showed sinus rhythm which is different from previous EKGs. EKG shows irregular pauses but normal waveforms.  - Continue to monitor  - Cardiac monitoring   3. DM Type 2 w/ Peripheral Neuropathy: Last HbA1c 7.5 on 01/30/14. Blood glucose 575 on admission, now 208. Pt takes Lantus 30 units QHS, Metformin 1,000 mg BID, and Gabapentin 300 mg TID at home.  - Repeat HbA1c --> 11.5. Patient has follow-up outpatient appointment. - CBGs 4 times daily  - Novolog ISS  - Lantus 30 units QHS  - Gabapentin 300 mg BID   4. HTN: Pt takes Cardizem 120 mg daily, Lisinopril 5 mg daily, and Metoprolol 25 mg QHS at home.  - Continue home meds   5. 3 Vessel CAD s/p stents (06/2013): Stable. Pt denies CP, SOB, palpitations. Pt was hospitalized from 06/10/13-06/12/13 after having an MI and now s/p BMS to proximal and distal LAD. Cath showed LM nl; LAD prox 95% with thrombus, mid 75%, D2 75%, LCX OM2 75%, RCA small, PDA 95%, PLV 95%. Pt on Cardizem 120 mg daily, Lisinopril 5 mg daily, Metoprolol 25 mg QHS, Aspirin 81 mg daily, Plavix 75 mg daily, and Pravastatin 40 mg daily.  - Continue home meds   6. HPL: Pt takes Pravastatin 40 mg daily at home.  - Continue Pravastatin   7. HIV: Last CD4 270 on 01/28/14 and VL < 20 on 02/03/14. Pt takes Complera (Emtricitab, Rilpivir, Tenofovir) at home.  - Continue home meds   8. Hx cocaine abuse: Pt has hx of cocaine abuse. Urine  drug screen +cocaine on admission. Last ED admission, patient counseled on rehab facilities and risks of cocaine abuse.  - Educated pt on risks of cocaine abuse  Diet: Carb Modified DVT/PE PPx: Heparin SQ  Dispo: Patient to be discharged today.   The patient does have a current PCP (Neema Bobbie Stack, MD) and does need an [facility] hospital follow-up appointment after discharge.  The patient does not have transportation limitations that hinder transportation to clinic appointments.  .Services Needed at time of discharge: Y = Yes, Blank = No PT:   OT:   RN:   Equipment:   Other:     LOS: 1 day   Albin Felling, MD 05/02/2014, 11:09 AM

## 2014-05-02 NOTE — Progress Notes (Signed)
[place] for bactrim and cipro Indication: Cellulitis  No Known Allergies  Patient Measurements: Height: 5' 7.32" (171 cm) Weight: 173 lb 11.6 oz (78.8 kg) IBW/kg (Calculated) : 66.84 Adjusted Body Weight:   Vital Signs: Temp: 97.6 F (36.4 C) (07/27 0527) Temp src: Oral (07/27 0527) BP: 124/70 mmHg (07/27 0527) Pulse Rate: 85 (07/27 0527) Intake/Output from previous day: 07/26 0701 - 07/27 0700 In: 600 [P.O.:600] Out: 1425 [Urine:1425] Intake/Output from this shift:    Labs:  Recent Labs  04/30/14 2258 05/01/14 0950 05/02/14 0620  WBC 4.4 5.6 2.9*  HGB 10.7* 9.9* 9.9*  PLT 170 164 159  CREATININE 1.32 1.01 0.96   Estimated Creatinine Clearance: 73.4 ml/min (by C-G formula based on Cr of 0.96). No results found for this basename: VANCOTROUGH, VANCOPEAK, VANCORANDOM, GENTTROUGH, GENTPEAK, GENTRANDOM, TOBRATROUGH, TOBRAPEAK, TOBRARND, AMIKACINPEAK, AMIKACINTROU, AMIKACIN,  in the last 72 hours   Microbiology: No results found for this or any previous visit (from the past 720 hour(s)).  Medical History: Past Medical History  Diagnosis Date  . Diabetes mellitus     Diagnosed in 2002, started insulin in 2012  . Hypertension   . Headache(784.0)     CT head 08/2011: Periventricular and subcortical white matter hypodensities are most in keeping with chronic microangiopathic change  . Gout   . Hyperlipidemia   . HIV infection Nov 2012    Followed by [name]  . Crack cocaine use     for 20+ years, has been enrolled in detox programs in the past  . Chondromalacia of medial femoral condyle     Left knee MRI 04/28/12: Chondromalacia of the medial femoral condyle with slight peripheral degeneration of the meniscocapsular junction of the medial meniscus; followed by sports medicine  . Asthma     No PFTs, history of childhood asthma  . Depression     with history of hospitalization for suicidal ideation  . CAD (coronary artery  disease) 06/10/2013    Presented as STEMI 06/10/13; s/p BMS to LAD; done at [facility] (LAD prox 95% with thrombus, mid 75%, D2 75%; LCX OM2 75%; RCA small, PDA 95%, PLV 95%)  . Gout 04/28/2012  . Collagen vascular disease   . Cellulitis 04/2014    left side of face    Medications:  Scheduled:  . allopurinol  200 mg Oral q morning - 10a  . amantadine  100 mg Oral Daily  . aspirin  81 mg Oral Daily  . clopidogrel  75 mg Oral Q breakfast  . diltiazem  120 mg Oral Daily  . DULoxetine  60 mg Oral Daily  . emtricitabine  200 mg Oral Daily   And  . rilpivirine  25 mg Oral Q breakfast   And  . tenofovir  300 mg Oral Daily  . gabapentin  300 mg Oral BID  . heparin  5,000 Units Subcutaneous 3 times per day  . insulin aspart  0-15 Units Subcutaneous TID WC  . insulin glargine  30 Units Subcutaneous QHS  . lisinopril  5 mg Oral Daily  . metoprolol succinate  25 mg Oral QHS  . simvastatin  10 mg Oral q1800   Assessment: 63yom (+)HIV, (+)DM with facial cellulitis, started on Vancomycin and Zosyn.  Pt with abscess x 1 s/p I&D a few days ago but continued to drain & swell, as well as a 2nd small evolving abscess per CT on admission.  SrCr today 0.96 with CrCl ~70 ml/min Plan to  change antibiotics to cipro and bactrim  Goal of Therapy:  Eradication of infection  Plan:  1-  Cipro 400 mg IV q12 hours. 2-  Bactrim 200 mg TMP IV q8 hours 3-  Change to po when appropriate, Cipro 500 mg bid and bactrim DS 2 tabs bid.   Excell Seltzer, PharmD [facility]

## 2014-05-02 NOTE — H&P (Signed)
INTERNAL MEDICINE TEACHING ATTENDING NOTE  Day 1 of stay  Patient name: Michael [name]  MRN: [id number] Date of birth: [birth year]   63 y.o. african [place] male with HIV and uncontrolled DM2 admitted for failure of subcutaneous facial skin abscess to heal (overlying left lateral mandible). He has received I&D in ED and feels relief. He has been on vancomycin and zocyn. He reported no fever and chills. Blood culture was not drawn before the administration of antibiotics. CT does not show any deep extensions of the abscess. On exam, the patient is alert and oriented. Afebrile, stable vitals. The I&D'd abscess is on the skin overlying the left lateral mandible. There is a circular opening of 0.5x0.5cm which is packed, has some induration and erythema, and is tender to palpate. There is another small abscess drained below it about 1x1cm in size. The oropharyngeal cavity is unremarkable with no signs of infection.   I have reviewed the chart, lab results, EKG, imaging and relevant notes of this patient.   Assessment and Plan                                                                       Subcutaneous abscess overlying lateral left mandible skin adjacent to left parotid gland with no deep or oropharyngeal extension and no systemic symptoms of bacteremia at present. I think the patient can be discharged on a broad empiric coverage of bactrim and ciprofloxacin for extended duration - with the early follow up in [facility] IM clinic(within next 2-3 days) and wound care appointment. If the patient spikes a fever, blood cultures should be drawn.   I have seen and evaluated this patient and discussed it with my IM resident team.  Please see the rest of the plan per resident note from today.   [place], [name] 05/02/2014, 1:58 PM.

## 2014-05-03 LAB — GLUCOSE, CAPILLARY: Glucose-Capillary: 161 mg/dL — ABNORMAL HIGH (ref 70–99)

## 2014-05-06 ENCOUNTER — Ambulatory Visit: Payer: Self-pay | Admitting: Internal Medicine

## 2014-05-06 ENCOUNTER — Encounter: Payer: Self-pay | Admitting: Internal Medicine

## 2014-05-06 NOTE — Discharge Summary (Signed)
INTERNAL MEDICINE ATTENDING DISCHARGE COSIGN   I evaluated the patient on the day of discharge and discussed the discharge plan with my resident team. I agree with the discharge documentation and disposition.   Madilyn Fireman 05/06/2014, 12:47 PM

## 2014-05-09 ENCOUNTER — Encounter (HOSPITAL_BASED_OUTPATIENT_CLINIC_OR_DEPARTMENT_OTHER): Payer: Medicare Other | Attending: Plastic Surgery

## 2014-05-09 NOTE — ED Provider Notes (Signed)
For 04/27/14 ED visit . Pt seen by Starlyn Skeans, PA-C: Medical screening examination/treatment/procedure(s) were performed by non-physician practitioner and as supervising physician I was immediately available for consultation/collaboration.   EKG Interpretation   Date/Time:  Sunday April 24 2014 12:00:26 EDT Ventricular Rate:  100 PR Interval:  136 QRS Duration: 91 QT Interval:  332 QTC Calculation: 428 R Axis:   60 Text Interpretation:  Sinus tachycardia Abnormal R-wave progression, early  transition Borderline T wave abnormalities Confirmed by Zenia Resides  MD, ANTHONY  ([id number]) on 04/24/2014 2:39:16 PM       Orlie Dakin, MD 05/09/14 1443

## 2014-05-11 ENCOUNTER — Ambulatory Visit: Payer: Self-pay | Admitting: Internal Medicine

## 2014-05-12 ENCOUNTER — Ambulatory Visit (INDEPENDENT_AMBULATORY_CARE_PROVIDER_SITE_OTHER): Payer: Medicare Other | Admitting: Internal Medicine

## 2014-05-12 ENCOUNTER — Encounter: Payer: Self-pay | Admitting: Internal Medicine

## 2014-05-12 DIAGNOSIS — IMO0002 Reserved for concepts with insufficient information to code with codable children: Secondary | ICD-10-CM

## 2014-05-12 DIAGNOSIS — E1165 Type 2 diabetes mellitus with hyperglycemia: Secondary | ICD-10-CM

## 2014-05-12 DIAGNOSIS — E1142 Type 2 diabetes mellitus with diabetic polyneuropathy: Secondary | ICD-10-CM

## 2014-05-12 DIAGNOSIS — E1140 Type 2 diabetes mellitus with diabetic neuropathy, unspecified: Secondary | ICD-10-CM

## 2014-05-12 DIAGNOSIS — E1149 Type 2 diabetes mellitus with other diabetic neurological complication: Secondary | ICD-10-CM

## 2014-05-12 LAB — GLUCOSE, CAPILLARY: GLUCOSE-CAPILLARY: 309 mg/dL — AB (ref 70–99)

## 2014-05-12 MED ORDER — IBUPROFEN 200 MG PO TABS: 800.0000 mg | ORAL_TABLET | Freq: Three times a day (TID) | ORAL | Status: DC | PRN
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
## 2014-05-12 NOTE — Patient Instructions (Signed)
You can take ibuprofen 800mg  up to three times a day for pain. Do not take ibuprofen for more than 5 days.
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
## 2014-05-13 ENCOUNTER — Ambulatory Visit (HOSPITAL_COMMUNITY)
Admission: RE | Admit: 2014-05-13 | Discharge: 2014-05-13 | Disposition: A | Discharge status: Home / Self Care | Payer: Medicare Other | Source: Ambulatory Visit | Attending: Internal Medicine | Admitting: Internal Medicine

## 2014-05-13 ENCOUNTER — Other Ambulatory Visit: Payer: Self-pay | Admitting: Internal Medicine

## 2014-05-13 ENCOUNTER — Other Ambulatory Visit: Payer: Self-pay | Admitting: *Deleted

## 2014-05-13 DIAGNOSIS — Q762 Congenital spondylolisthesis: Secondary | ICD-10-CM | POA: Insufficient documentation

## 2014-05-13 DIAGNOSIS — M545 Low back pain, unspecified: Secondary | ICD-10-CM | POA: Diagnosis present

## 2014-05-13 DIAGNOSIS — M5030 Other cervical disc degeneration, unspecified cervical region: Secondary | ICD-10-CM | POA: Diagnosis not present

## 2014-05-13 DIAGNOSIS — E1165 Type 2 diabetes mellitus with hyperglycemia: Principal | ICD-10-CM

## 2014-05-13 DIAGNOSIS — IMO0002 Reserved for concepts with insufficient information to code with codable children: Secondary | ICD-10-CM

## 2014-05-13 DIAGNOSIS — E1140 Type 2 diabetes mellitus with diabetic neuropathy, unspecified: Secondary | ICD-10-CM

## 2014-05-13 MED ORDER — METFORMIN HCL 1000 MG PO TABS: 1000.0000 mg | ORAL_TABLET | Freq: Two times a day (BID) | ORAL | Status: DC

## 2014-05-13 NOTE — Telephone Encounter (Signed)
Refill sent. Thanks

## 2014-05-17 NOTE — Progress Notes (Signed)
INTERNAL MEDICINE TEACHING ATTENDING ADDENDUM - Aldine Contes, MD: I personally saw and evaluated Michael [name] in this clinic visit in conjunction with the resident, Dr. Hulen Luster. I have discussed patient's plan of care with medical resident during this visit. I have confirmed the physical exam findings and have read and agree with the clinic note including the plan with the following addition: On exam: neuro- No focal neurological deficits, Strength intact, Cardio- RRR, normal heart sounds, Lungs- CTA b/l                   Skin- facial abscess healing, no tenderness/fluctuation - f/u cervical and lumbar x rays - c/w ibuprofen for pain control - Will consider MRI if persistent pain or worsening symptoms

## 2014-05-18 NOTE — Telephone Encounter (Signed)
i filled on 8/7 for 5 months, thank you

## 2014-06-17 ENCOUNTER — Other Ambulatory Visit: Payer: Self-pay | Admitting: *Deleted

## 2014-06-19 MED ORDER — DICLOFENAC SODIUM 1 % TD GEL: 2.0000 g | Freq: Two times a day (BID) | TRANSDERMAL | Status: DC | PRN

## 2014-06-21 ENCOUNTER — Emergency Department (HOSPITAL_COMMUNITY)
Admission: EM | Admit: 2014-06-21 | Discharge: 2014-06-21 | Disposition: A | Discharge status: Home / Self Care | Payer: Medicare Other | Attending: Emergency Medicine | Admitting: Emergency Medicine

## 2014-06-21 ENCOUNTER — Encounter (HOSPITAL_COMMUNITY): Payer: Self-pay | Admitting: Emergency Medicine

## 2014-06-21 DIAGNOSIS — J45909 Unspecified asthma, uncomplicated: Secondary | ICD-10-CM | POA: Diagnosis not present

## 2014-06-21 DIAGNOSIS — Z79899 Other long term (current) drug therapy: Secondary | ICD-10-CM | POA: Diagnosis not present

## 2014-06-21 DIAGNOSIS — I2510 Atherosclerotic heart disease of native coronary artery without angina pectoris: Secondary | ICD-10-CM | POA: Diagnosis not present

## 2014-06-21 DIAGNOSIS — Z794 Long term (current) use of insulin: Secondary | ICD-10-CM | POA: Diagnosis not present

## 2014-06-21 DIAGNOSIS — Z7982 Long term (current) use of aspirin: Secondary | ICD-10-CM | POA: Insufficient documentation

## 2014-06-21 DIAGNOSIS — Z8739 Personal history of other diseases of the musculoskeletal system and connective tissue: Secondary | ICD-10-CM | POA: Insufficient documentation

## 2014-06-21 DIAGNOSIS — L03317 Cellulitis of buttock: Principal | ICD-10-CM

## 2014-06-21 DIAGNOSIS — E785 Hyperlipidemia, unspecified: Secondary | ICD-10-CM | POA: Insufficient documentation

## 2014-06-21 DIAGNOSIS — Z791 Long term (current) use of non-steroidal anti-inflammatories (NSAID): Secondary | ICD-10-CM | POA: Diagnosis not present

## 2014-06-21 DIAGNOSIS — E119 Type 2 diabetes mellitus without complications: Secondary | ICD-10-CM | POA: Diagnosis not present

## 2014-06-21 DIAGNOSIS — I10 Essential (primary) hypertension: Secondary | ICD-10-CM | POA: Insufficient documentation

## 2014-06-21 DIAGNOSIS — F329 Major depressive disorder, single episode, unspecified: Secondary | ICD-10-CM | POA: Insufficient documentation

## 2014-06-21 DIAGNOSIS — L0231 Cutaneous abscess of buttock: Secondary | ICD-10-CM | POA: Diagnosis present

## 2014-06-21 DIAGNOSIS — F3289 Other specified depressive episodes: Secondary | ICD-10-CM | POA: Insufficient documentation

## 2014-06-21 DIAGNOSIS — M109 Gout, unspecified: Secondary | ICD-10-CM | POA: Insufficient documentation

## 2014-06-21 DIAGNOSIS — Z9889 Other specified postprocedural states: Secondary | ICD-10-CM | POA: Insufficient documentation

## 2014-06-21 DIAGNOSIS — Z21 Asymptomatic human immunodeficiency virus [HIV] infection status: Secondary | ICD-10-CM | POA: Diagnosis not present

## 2014-06-21 DIAGNOSIS — R739 Hyperglycemia, unspecified: Secondary | ICD-10-CM

## 2014-06-21 DIAGNOSIS — L0291 Cutaneous abscess, unspecified: Secondary | ICD-10-CM

## 2014-06-21 LAB — CBG MONITORING, ED: GLUCOSE-CAPILLARY: 485 mg/dL — AB (ref 70–99)

## 2014-06-21 MED ORDER — LIDOCAINE-EPINEPHRINE (PF) 2 %-1:200000 IJ SOLN
20.0000 mL | Freq: Once | INTRAMUSCULAR | Status: DC
  Filled 2014-06-21: qty 20

## 2014-06-21 MED ORDER — HYDROCODONE-ACETAMINOPHEN 5-325 MG PO TABS: ORAL_TABLET | ORAL | Status: DC

## 2014-06-21 MED ORDER — SULFAMETHOXAZOLE-TRIMETHOPRIM 800-160 MG PO TABS: 1.0000 | ORAL_TABLET | Freq: Two times a day (BID) | ORAL | Status: DC

## 2014-06-21 MED ORDER — SULFAMETHOXAZOLE-TMP DS 800-160 MG PO TABS
1.0000 | ORAL_TABLET | Freq: Once | ORAL | Status: AC
  Administered 2014-06-21: 1 via ORAL
  Filled 2014-06-21: qty 1

## 2014-06-21 NOTE — ED Notes (Signed)
Pt reports abscess to right buttocks x 2 days that is progressively worsening. Denies fever/chills. Pt in NAD. AO x4.

## 2014-06-21 NOTE — Discharge Instructions (Signed)
Please read and follow all provided instructions.  Your diagnoses today include:  1. Abscess     Tests performed today include:  Vital signs. See below for your results today.   Medications prescribed:   Bactrim (trimethoprim/sulfamethoxazole) - antibiotic  You have been prescribed an antibiotic medicine: take the entire course of medicine even if you are feeling better. Stopping early can cause the antibiotic not to work.   Vicodin (hydrocodone/acetaminophen) - narcotic pain medication  DO NOT drive or perform any activities that require you to be awake and alert because this medicine can make you drowsy. BE VERY CAREFUL not to take multiple medicines containing Tylenol (also called acetaminophen). Doing so can lead to an overdose which can damage your liver and cause liver failure and possibly death.  Take any prescribed medications only as directed.   Home care instructions:   Follow any educational materials contained in this packet  Follow-up instructions: Return to the Emergency Department in 48 hours for a recheck if your symptoms are not significantly improved.  Please follow-up with your primary care provider in the next 1 week for further evaluation of your symptoms.   Return instructions:  Return to the Emergency Department if you have:  Fever  Worsening symptoms  Worsening pain  Worsening swelling  Redness of the skin that moves away from the affected area, especially if it streaks away from the affected area   Any other emergent concerns  Your vital signs today were: BP 140/73   Pulse 115   Temp(Src) 97.9 F (36.6 C) (Oral)   Resp 16   Ht 5\' 7"  (1.702 m)   Wt 175 lb (79.379 kg)   BMI 27.40 kg/m2   SpO2 99% If your blood pressure (BP) was elevated above 135/85 this visit, please have this repeated by your doctor within one month. --------------

## 2014-06-21 NOTE — ED Notes (Signed)
Clarified with [place], [place]. No coverage needed for CBG of 485. Preparing for discharge. Xylocaine was given by Merrily Pew, PA at the bedside.

## 2014-06-21 NOTE — ED Notes (Signed)
CBG of 485 reported to [place], [place]. No new orders received.

## 2014-06-21 NOTE — ED Provider Notes (Signed)
CSN: [id number]     Arrival date & time 06/21/14  2057 History  This chart was scribed for non-physician practitioner, Alecia Lemming, PA-C working with [name], MD by Frederich Balding, ED scribe. This patient was seen in room TR06C/TR06C and the patient's care was started at 9:43 PM.     Chief Complaint  Patient presents with  . Abscess   The history is provided by the patient. No language interpreter was used.   HPI Comments: Michael [name] is a 63 y.o. male with history of diabetes and HIV who presents to the Emergency Department complaining of an abscess to his right buttocks that started 2 days ago. Pain and swelling have worsened. Pressure worsens the pain. Pt states he hasn't checked his sugars in a few days and hasn't taken his insulin like he's supposed to. Denies fever, nausea, emesis.   Past Medical History  Diagnosis Date  . Diabetes mellitus     Diagnosed in 2002, started insulin in 2012  . Hypertension   . Headache(784.0)     CT head 08/2011: Periventricular and subcortical white matter hypodensities are most in keeping with chronic microangiopathic change  . Gout   . Hyperlipidemia   . HIV infection Nov 2012    Followed by [name]  . Crack cocaine use     for 20+ years, has been enrolled in detox programs in the past  . Chondromalacia of medial femoral condyle     Left knee MRI 04/28/12: Chondromalacia of the medial femoral condyle with slight peripheral degeneration of the meniscocapsular junction of the medial meniscus; followed by sports medicine  . Asthma     No PFTs, history of childhood asthma  . Depression     with history of hospitalization for suicidal ideation  . CAD (coronary artery disease) 06/10/2013    Presented as STEMI 06/10/13; s/p BMS to LAD; done at [facility] (LAD prox 95% with thrombus, mid 75%, D2 75%; LCX OM2 75%; RCA small, PDA 95%, PLV 95%)  . Gout 04/28/2012  . Collagen vascular disease   . Cellulitis 04/2014    left side of face   Past  Surgical History  Procedure Laterality Date  . Prostate surgery    . Bowel resection    . Back surgery      1988  . Cardiac surgery    . Cervical spine surgery      " rods in my neck "   Family History  Problem Relation Age of Onset  . Diabetes Mother   . Diabetes Father   . Diabetes Brother   . Colon cancer Neg Hx    History  Substance Use Topics  . Smoking status: Never Smoker   . Smokeless tobacco: Never Used  . Alcohol Use: No    Review of Systems  Constitutional: Negative for fever.  HENT: Negative for congestion.   Eyes: Negative for redness.  Respiratory: Negative for shortness of breath.   Cardiovascular: Negative for chest pain.  Gastrointestinal: Negative for nausea and vomiting.  Musculoskeletal: Negative for gait problem.  Skin: Negative for color change.       Abscess  Neurological: Negative for speech difficulty.  Hematological: Negative for adenopathy.  Psychiatric/Behavioral: Negative for confusion.   Allergies  Review of patient's allergies indicates no known allergies.  Home Medications   Prior to Admission medications   Medication Sig Start Date End Date Taking? Authorizing Provider  albuterol (PROVENTIL HFA;VENTOLIN HFA) 108 (90 BASE) MCG/ACT inhaler Inhale 1 puff  into the lungs every 6 (six) hours as needed for wheezing or shortness of breath.    Historical Provider, MD  allopurinol (ZYLOPRIM) 100 MG tablet Take 2 tablets (200 mg total) by mouth every morning. For gout. 09/15/13   Neema Bobbie Stack, MD  amantadine (SYMMETREL) 100 MG capsule Take 100 mg by mouth daily.  09/22/12   Historical Provider, MD  aspirin 81 MG chewable tablet Chew 81 mg by mouth daily. For blood thinner.    Historical Provider, MD  benzonatate (TESSALON) 100 MG capsule Take 2 capsules (200 mg total) by mouth 2 (two) times daily as needed for cough. 02/24/14   Hannah Muthersbaugh, PA-C  ciprofloxacin (CIPRO) 500 MG tablet Take 1 tablet (500 mg total) by mouth every 12 (twelve)  hours. 05/02/14   Albin Felling, MD  diclofenac sodium (VOLTAREN) 1 % GEL Apply 2 g topically 2 (two) times daily as needed (pain). 06/19/14   [name], MD  diltiazem (CARDIZEM CD) 120 MG 24 hr capsule Take 120 mg by mouth daily. Take 1 capsule (120 mg total) by mouth daily. 06/13/13   Historical Provider, MD  DULoxetine (CYMBALTA) 60 MG capsule Take 1 capsule (60 mg total) by mouth daily.    Othella Boyer, MD  Emtricitab-Rilpivir-Tenofovir (COMPLERA) 200-25-300 MG TABS Take 1 tablet by mouth daily. 02/01/14   [name], MD  gabapentin (NEURONTIN) 300 MG capsule Take 300 mg by mouth 3 (three) times daily.    Historical Provider, MD  HYDROcodone-homatropine (HYCODAN) 5-1.5 MG/5ML syrup Take 5 mLs by mouth every 6 (six) hours as needed for cough.    Historical Provider, MD  ibuprofen (MOTRIN IB) 200 MG tablet Take 4 tablets (800 mg total) by mouth 3 (three) times daily as needed. 05/12/14 05/12/15  [name], MD  insulin glargine (LANTUS) 100 UNIT/ML injection Inject 30 units daily before bed 04/24/14 04/24/15  Carlisle Cater, PA-C  ipratropium-albuterol (DUONEB) 0.5-2.5 (3) MG/3ML SOLN Take 3 mLs by nebulization every 2 (two) hours as needed (shortness of breath).    Historical Provider, MD  lisinopril (PRINIVIL,ZESTRIL) 5 MG tablet Take 1 tablet (5 mg total) by mouth daily. 08/06/13 08/06/14  Neema Bobbie Stack, MD  LORazepam (ATIVAN) 0.5 MG tablet Take 0.5 mg by mouth every 8 (eight) hours as needed for anxiety.    Historical Provider, MD  metFORMIN (GLUCOPHAGE) 1000 MG tablet Take 1 tablet (1,000 mg total) by mouth 2 (two) times daily with a meal. 05/13/14   [name], MD  metoprolol succinate (TOPROL-XL) 25 MG 24 hr tablet Take 25 mg by mouth at bedtime.     Historical Provider, MD  nitroGLYCERIN (NITROSTAT) 0.4 MG SL tablet 0.4 mg. Place 1 tablet (0.4 mg total) under the tongue every 5 (five) minutes as needed for Chest pain. 06/13/13 06/13/14  Historical Provider, MD  pravastatin (PRAVACHOL) 40 MG  tablet Take 40 mg by mouth every evening.    Historical Provider, MD  sulfamethoxazole-trimethoprim (BACTRIM DS) 800-160 MG per tablet Take 1 tablet by mouth every 12 (twelve) hours. 05/02/14   Albin Felling, MD  traMADol (ULTRAM) 50 MG tablet Take 1 tablet (50 mg total) by mouth every 6 (six) hours as needed for pain. 06/24/13   Neema Bobbie Stack, MD   BP 140/73  Pulse 115  Temp(Src) 97.9 F (36.6 C) (Oral)  Resp 16  Ht 5\' 7"  (1.702 m)  Wt 175 lb (79.379 kg)  BMI 27.40 kg/m2  SpO2 99%  Physical Exam  Nursing note and vitals reviewed.  Constitutional: He is oriented to person, place, and time. He appears well-developed and well-nourished. No distress.  HENT:  Head: Normocephalic and atraumatic.  Eyes: Conjunctivae and EOM are normal.  Neck: Normal range of motion. Neck supple. No tracheal deviation present.  Cardiovascular: Normal rate.   Pulmonary/Chest: Effort normal. No respiratory distress.  Musculoskeletal: Normal range of motion.  Neurological: He is alert and oriented to person, place, and time.  Skin: Skin is warm and dry.  Patients with right proximal posterior thigh abscess/pustule, approximately 1-2 cm in diameter, no surrounding induration or cellulitis.  Psychiatric: He has a normal mood and affect. His behavior is normal.    ED Course  Procedures (including critical care time)  INCISION AND DRAINAGE Performed by: Alecia Lemming, PA-C and Sherene Sires, PA-S Consent: Verbal consent obtained. Risks and benefits: risks, benefits and alternatives were discussed Type: abscess  Body area: right buttocks  Anesthesia: local infiltration  Incision was made with a scalpel.  Local anesthetic: lidocaine 2% with epinephrine  Anesthetic total: 3 ml  Complexity: complex Blunt dissection to break up loculations  Drainage: purulent  Drainage amount: minimal  Packing material: none  Patient tolerance: Patient tolerated the procedure well with no immediate  complications.   DIAGNOSTIC STUDIES: Oxygen Saturation is 99% on RA, normal by my interpretation.    COORDINATION OF CARE: 9:46 PM-Discussed treatment plan which includes I&D and an antibiotic with pt at bedside and pt agreed to plan.   Labs Review Labs Reviewed  CBG MONITORING, ED - Abnormal; Notable for the following:    Glucose-Capillary 485 (*)    All other components within normal limits    Imaging Review No results found.   EKG Interpretation None      12:10 AM The patient was urged to return to the Emergency Department urgently with worsening pain, swelling, expanding erythema especially if it streaks away from the affected area, fever, or if they have any other concerns.   The patient was urged to return to the Emergency Department or go to their PCP in 48 hours for wound recheck if the area is not significantly improved. The patient verbalized understanding and stated agreement with this plan.     MDM   Final diagnoses:  Abscess  Hyperglycemia without ketosis   Patients with posterior thigh abscess, drained without complication. Given immunocompromise will give course of antibiotics.  Patient admits to not being compliant with his anti-hyperglycemics for several days. Blood sugar found to be 485. Patient does not have any clinical signs of dehydration, vomiting, or abdominal pain to suggest diabetic ketoacidosis. Patient states that he has insulin at home and has just not been taking it. Do not feel that additional treatment in the ED is warranted at this time. Patient will be discharged home to take his nighttime dose of Lantus as prescribed to him.  I personally performed the services described in this documentation, which was scribed in my presence. The recorded information has been reviewed and is accurate.  Carlisle Cater, PA-C 06/22/14 [phone]

## 2014-06-22 NOTE — ED Provider Notes (Signed)
Medical screening examination/treatment/procedure(s) were performed by non-physician practitioner and as supervising physician I was immediately available for consultation/collaboration.   EKG Interpretation None        [name], MD 06/22/14 0021

## 2014-06-24 ENCOUNTER — Observation Stay (HOSPITAL_COMMUNITY)
Admission: EM | Admit: 2014-06-24 | Discharge: 2014-06-28 | Disposition: A | Discharge status: Home / Self Care | Payer: Medicare Other | Attending: Internal Medicine | Admitting: Internal Medicine

## 2014-06-24 ENCOUNTER — Emergency Department (HOSPITAL_COMMUNITY): Payer: Medicare Other

## 2014-06-24 ENCOUNTER — Encounter (HOSPITAL_COMMUNITY): Payer: Self-pay | Admitting: Emergency Medicine

## 2014-06-24 DIAGNOSIS — J45909 Unspecified asthma, uncomplicated: Secondary | ICD-10-CM | POA: Insufficient documentation

## 2014-06-24 DIAGNOSIS — F329 Major depressive disorder, single episode, unspecified: Secondary | ICD-10-CM | POA: Insufficient documentation

## 2014-06-24 DIAGNOSIS — R079 Chest pain, unspecified: Secondary | ICD-10-CM | POA: Diagnosis present

## 2014-06-24 DIAGNOSIS — F3289 Other specified depressive episodes: Secondary | ICD-10-CM | POA: Insufficient documentation

## 2014-06-24 DIAGNOSIS — Z21 Asymptomatic human immunodeficiency virus [HIV] infection status: Secondary | ICD-10-CM | POA: Diagnosis not present

## 2014-06-24 DIAGNOSIS — E1165 Type 2 diabetes mellitus with hyperglycemia: Secondary | ICD-10-CM

## 2014-06-24 DIAGNOSIS — M542 Cervicalgia: Secondary | ICD-10-CM

## 2014-06-24 DIAGNOSIS — R059 Cough, unspecified: Secondary | ICD-10-CM | POA: Insufficient documentation

## 2014-06-24 DIAGNOSIS — R05 Cough: Secondary | ICD-10-CM | POA: Diagnosis not present

## 2014-06-24 DIAGNOSIS — Z9861 Coronary angioplasty status: Secondary | ICD-10-CM | POA: Diagnosis not present

## 2014-06-24 DIAGNOSIS — Z23 Encounter for immunization: Secondary | ICD-10-CM | POA: Diagnosis not present

## 2014-06-24 DIAGNOSIS — I2510 Atherosclerotic heart disease of native coronary artery without angina pectoris: Secondary | ICD-10-CM | POA: Diagnosis not present

## 2014-06-24 DIAGNOSIS — IMO0002 Reserved for concepts with insufficient information to code with codable children: Secondary | ICD-10-CM

## 2014-06-24 DIAGNOSIS — E1140 Type 2 diabetes mellitus with diabetic neuropathy, unspecified: Secondary | ICD-10-CM

## 2014-06-24 DIAGNOSIS — I10 Essential (primary) hypertension: Secondary | ICD-10-CM | POA: Diagnosis not present

## 2014-06-24 DIAGNOSIS — R0602 Shortness of breath: Secondary | ICD-10-CM | POA: Diagnosis not present

## 2014-06-24 DIAGNOSIS — E785 Hyperlipidemia, unspecified: Secondary | ICD-10-CM | POA: Diagnosis not present

## 2014-06-24 DIAGNOSIS — Z79899 Other long term (current) drug therapy: Secondary | ICD-10-CM | POA: Diagnosis not present

## 2014-06-24 DIAGNOSIS — E119 Type 2 diabetes mellitus without complications: Secondary | ICD-10-CM | POA: Insufficient documentation

## 2014-06-24 DIAGNOSIS — M5416 Radiculopathy, lumbar region: Secondary | ICD-10-CM

## 2014-06-24 DIAGNOSIS — I499 Cardiac arrhythmia, unspecified: Secondary | ICD-10-CM | POA: Diagnosis present

## 2014-06-24 DIAGNOSIS — Z872 Personal history of diseases of the skin and subcutaneous tissue: Secondary | ICD-10-CM | POA: Diagnosis not present

## 2014-06-24 DIAGNOSIS — R739 Hyperglycemia, unspecified: Secondary | ICD-10-CM

## 2014-06-24 DIAGNOSIS — R42 Dizziness and giddiness: Secondary | ICD-10-CM | POA: Diagnosis not present

## 2014-06-24 DIAGNOSIS — F1410 Cocaine abuse, uncomplicated: Secondary | ICD-10-CM | POA: Diagnosis present

## 2014-06-24 DIAGNOSIS — M109 Gout, unspecified: Secondary | ICD-10-CM | POA: Diagnosis not present

## 2014-06-24 DIAGNOSIS — R2681 Unsteadiness on feet: Secondary | ICD-10-CM | POA: Diagnosis present

## 2014-06-24 DIAGNOSIS — B20 Human immunodeficiency virus [HIV] disease: Secondary | ICD-10-CM

## 2014-06-24 LAB — I-STAT TROPONIN, ED: TROPONIN I, POC: 0 ng/mL (ref 0.00–0.08)

## 2014-06-24 LAB — CBC
HEMATOCRIT: 34.8 % — AB (ref 39.0–52.0)
HEMOGLOBIN: 11.5 g/dL — AB (ref 13.0–17.0)
MCH: 26 pg (ref 26.0–34.0)
MCHC: 33 g/dL (ref 30.0–36.0)
MCV: 78.6 fL (ref 78.0–100.0)
Platelets: 149 10*3/uL — ABNORMAL LOW (ref 150–400)
RBC: 4.43 MIL/uL (ref 4.22–5.81)
RDW: 13.3 % (ref 11.5–15.5)
WBC: 4.2 10*3/uL (ref 4.0–10.5)

## 2014-06-24 NOTE — ED Notes (Signed)
Pt. reports left chest pain onset 4 days ago with SOB , productive cough and nausea , denies diaphoresis or emesis , history of CAD / coronary stent .

## 2014-06-24 NOTE — ED Notes (Signed)
Patient transported to X-ray 

## 2014-06-25 ENCOUNTER — Encounter (HOSPITAL_COMMUNITY): Payer: Self-pay | Admitting: Nurse Practitioner

## 2014-06-25 DIAGNOSIS — E1149 Type 2 diabetes mellitus with other diabetic neurological complication: Secondary | ICD-10-CM

## 2014-06-25 DIAGNOSIS — E1142 Type 2 diabetes mellitus with diabetic polyneuropathy: Secondary | ICD-10-CM

## 2014-06-25 DIAGNOSIS — R059 Cough, unspecified: Secondary | ICD-10-CM | POA: Diagnosis not present

## 2014-06-25 DIAGNOSIS — E119 Type 2 diabetes mellitus without complications: Secondary | ICD-10-CM | POA: Diagnosis not present

## 2014-06-25 DIAGNOSIS — F3289 Other specified depressive episodes: Secondary | ICD-10-CM

## 2014-06-25 DIAGNOSIS — Z21 Asymptomatic human immunodeficiency virus [HIV] infection status: Secondary | ICD-10-CM

## 2014-06-25 DIAGNOSIS — R2681 Unsteadiness on feet: Secondary | ICD-10-CM | POA: Diagnosis present

## 2014-06-25 DIAGNOSIS — F329 Major depressive disorder, single episode, unspecified: Secondary | ICD-10-CM

## 2014-06-25 DIAGNOSIS — R5381 Other malaise: Secondary | ICD-10-CM

## 2014-06-25 DIAGNOSIS — I10 Essential (primary) hypertension: Secondary | ICD-10-CM

## 2014-06-25 DIAGNOSIS — R079 Chest pain, unspecified: Secondary | ICD-10-CM | POA: Diagnosis not present

## 2014-06-25 DIAGNOSIS — F1410 Cocaine abuse, uncomplicated: Secondary | ICD-10-CM

## 2014-06-25 DIAGNOSIS — M109 Gout, unspecified: Secondary | ICD-10-CM

## 2014-06-25 DIAGNOSIS — R5383 Other fatigue: Secondary | ICD-10-CM

## 2014-06-25 DIAGNOSIS — J45909 Unspecified asthma, uncomplicated: Secondary | ICD-10-CM

## 2014-06-25 DIAGNOSIS — IMO0001 Reserved for inherently not codable concepts without codable children: Secondary | ICD-10-CM

## 2014-06-25 DIAGNOSIS — R05 Cough: Secondary | ICD-10-CM | POA: Diagnosis not present

## 2014-06-25 DIAGNOSIS — F1910 Other psychoactive substance abuse, uncomplicated: Secondary | ICD-10-CM

## 2014-06-25 DIAGNOSIS — E785 Hyperlipidemia, unspecified: Secondary | ICD-10-CM

## 2014-06-25 DIAGNOSIS — I2510 Atherosclerotic heart disease of native coronary artery without angina pectoris: Secondary | ICD-10-CM

## 2014-06-25 DIAGNOSIS — R42 Dizziness and giddiness: Secondary | ICD-10-CM | POA: Diagnosis not present

## 2014-06-25 DIAGNOSIS — G8929 Other chronic pain: Secondary | ICD-10-CM

## 2014-06-25 DIAGNOSIS — E1165 Type 2 diabetes mellitus with hyperglycemia: Secondary | ICD-10-CM

## 2014-06-25 LAB — BASIC METABOLIC PANEL
Anion gap: 14 (ref 5–15)
Anion gap: 10 (ref 5–15)
BUN: 22 mg/dL (ref 6–23)
BUN: 18 mg/dL (ref 6–23)
CHLORIDE: 97 meq/L (ref 96–112)
CHLORIDE: 102 meq/L (ref 96–112)
CO2: 23 mEq/L (ref 19–32)
CO2: 25 mEq/L (ref 19–32)
Calcium: 9.3 mg/dL (ref 8.4–10.5)
Calcium: 8.6 mg/dL (ref 8.4–10.5)
Creatinine, Ser: 1.06 mg/dL (ref 0.50–1.35)
Creatinine, Ser: 0.97 mg/dL (ref 0.50–1.35)
GFR calc Af Amer: 84 mL/min — ABNORMAL LOW (ref >90)
GFR calc Af Amer: >90 mL/min (ref >90)
GFR calc non Af Amer: 72 mL/min — ABNORMAL LOW (ref >90)
GFR calc non Af Amer: 85 mL/min — ABNORMAL LOW (ref >90)
Glucose, Bld: 520 mg/dL — ABNORMAL HIGH (ref 70–99)
Glucose, Bld: 389 mg/dL — ABNORMAL HIGH (ref 70–99)
POTASSIUM: 4.2 meq/L (ref 3.7–5.3)
POTASSIUM: 4.1 meq/L (ref 3.7–5.3)
Sodium: 134 mEq/L — ABNORMAL LOW (ref 137–147)
Sodium: 137 mEq/L (ref 137–147)

## 2014-06-25 LAB — CBC WITH DIFFERENTIAL/PLATELET
Basophils Absolute: 0 10*3/uL (ref 0.0–0.1)
Basophils Relative: 1 % (ref 0–1)
EOS ABS: 0.1 10*3/uL (ref 0.0–0.7)
EOS PCT: 2 % (ref 0–5)
HEMATOCRIT: 31.3 % — AB (ref 39.0–52.0)
HEMOGLOBIN: 10.5 g/dL — AB (ref 13.0–17.0)
LYMPHS ABS: 1 10*3/uL (ref 0.7–4.0)
Lymphocytes Relative: 26 % (ref 12–46)
MCH: 26.3 pg (ref 26.0–34.0)
MCHC: 33.5 g/dL (ref 30.0–36.0)
MCV: 78.4 fL (ref 78.0–100.0)
MONO ABS: 0.2 10*3/uL (ref 0.1–1.0)
MONOS PCT: 4 % (ref 3–12)
Neutro Abs: 2.7 10*3/uL (ref 1.7–7.7)
Neutrophils Relative %: 67 % (ref 43–77)
Platelets: 122 10*3/uL — ABNORMAL LOW (ref 150–400)
RBC: 3.99 MIL/uL — AB (ref 4.22–5.81)
RDW: 13.3 % (ref 11.5–15.5)
WBC: 4 10*3/uL (ref 4.0–10.5)

## 2014-06-25 LAB — RAPID URINE DRUG SCREEN, HOSP PERFORMED
Amphetamines: NOT DETECTED
BENZODIAZEPINES: NOT DETECTED
Barbiturates: NOT DETECTED
COCAINE: NOT DETECTED
OPIATES: NOT DETECTED
Tetrahydrocannabinol: NOT DETECTED

## 2014-06-25 LAB — TROPONIN I

## 2014-06-25 LAB — CBG MONITORING, ED
GLUCOSE-CAPILLARY: 418 mg/dL — AB (ref 70–99)
Glucose-Capillary: 377 mg/dL — ABNORMAL HIGH (ref 70–99)

## 2014-06-25 LAB — GLUCOSE, CAPILLARY
Glucose-Capillary: 383 mg/dL — ABNORMAL HIGH (ref 70–99)
Glucose-Capillary: 184 mg/dL — ABNORMAL HIGH (ref 70–99)
Glucose-Capillary: 242 mg/dL — ABNORMAL HIGH (ref 70–99)
Glucose-Capillary: 268 mg/dL — ABNORMAL HIGH (ref 70–99)

## 2014-06-25 LAB — PRO B NATRIURETIC PEPTIDE: Pro B Natriuretic peptide (BNP): 79.5 pg/mL (ref 0–125)

## 2014-06-25 MED ORDER — TRAMADOL HCL 50 MG PO TABS
50.0000 mg | ORAL_TABLET | Freq: Four times a day (QID) | ORAL | Status: DC | PRN
  Administered 2014-06-25 – 2014-06-27 (×4): 50 mg via ORAL
  Filled 2014-06-25 (×4): qty 1

## 2014-06-25 MED ORDER — HEPARIN SODIUM (PORCINE) 5000 UNIT/ML IJ SOLN
5000.0000 [IU] | Freq: Three times a day (TID) | INTRAMUSCULAR | Status: DC
  Administered 2014-06-25 – 2014-06-28 (×10): 5000 [IU] via SUBCUTANEOUS
  Filled 2014-06-25 (×12): qty 1

## 2014-06-25 MED ORDER — ASPIRIN EC 325 MG PO TBEC
325.0000 mg | DELAYED_RELEASE_TABLET | Freq: Every day | ORAL | Status: DC
  Administered 2014-06-25 – 2014-06-28 (×4): 325 mg via ORAL
  Filled 2014-06-25 (×4): qty 1

## 2014-06-25 MED ORDER — GLUCERNA SHAKE PO LIQD
237.0000 mL | Freq: Every morning | ORAL | Status: DC
  Administered 2014-06-25 – 2014-06-28 (×4): 237 mL via ORAL

## 2014-06-25 MED ORDER — ACETAMINOPHEN 325 MG PO TABS: 650.0000 mg | ORAL_TABLET | ORAL | Status: DC | PRN

## 2014-06-25 MED ORDER — ONDANSETRON HCL 4 MG/2ML IJ SOLN: 4.0000 mg | Freq: Four times a day (QID) | INTRAMUSCULAR | Status: DC | PRN

## 2014-06-25 MED ORDER — INFLUENZA VAC SPLIT QUAD 0.5 ML IM SUSY
0.5000 mL | PREFILLED_SYRINGE | INTRAMUSCULAR | Status: AC
  Administered 2014-06-26: 0.5 mL via INTRAMUSCULAR
  Filled 2014-06-25: qty 0.5

## 2014-06-25 MED ORDER — GABAPENTIN 300 MG PO CAPS
300.0000 mg | ORAL_CAPSULE | Freq: Three times a day (TID) | ORAL | Status: DC
  Administered 2014-06-25 – 2014-06-28 (×11): 300 mg via ORAL
  Filled 2014-06-25 (×12): qty 1

## 2014-06-25 MED ORDER — DILTIAZEM HCL ER 120 MG PO CP24
120.0000 mg | ORAL_CAPSULE | Freq: Every day | ORAL | Status: DC
  Administered 2014-06-25 – 2014-06-28 (×4): 120 mg via ORAL
  Filled 2014-06-25 (×4): qty 1

## 2014-06-25 MED ORDER — INSULIN GLARGINE 100 UNIT/ML ~~LOC~~ SOLN
25.0000 [IU] | Freq: Every day | SUBCUTANEOUS | Status: DC
  Administered 2014-06-25 – 2014-06-27 (×3): 25 [IU] via SUBCUTANEOUS
  Filled 2014-06-25 (×5): qty 0.25

## 2014-06-25 MED ORDER — EMTRICITAB-RILPIVIR-TENOFOV DF 200-25-300 MG PO TABS: 1.0000 | ORAL_TABLET | Freq: Every day | ORAL | Status: DC

## 2014-06-25 MED ORDER — ALLOPURINOL 100 MG PO TABS
200.0000 mg | ORAL_TABLET | Freq: Every day | ORAL | Status: DC
  Administered 2014-06-25 – 2014-06-28 (×4): 200 mg via ORAL
  Filled 2014-06-25 (×4): qty 2

## 2014-06-25 MED ORDER — INSULIN ASPART 100 UNIT/ML ~~LOC~~ SOLN
0.0000 [IU] | Freq: Three times a day (TID) | SUBCUTANEOUS | Status: DC
  Administered 2014-06-25: 15 [IU] via SUBCUTANEOUS

## 2014-06-25 MED ORDER — CLOPIDOGREL BISULFATE 75 MG PO TABS
75.0000 mg | ORAL_TABLET | Freq: Every day | ORAL | Status: DC
Start: 2014-06-25 — End: 2014-06-28
  Administered 2014-06-25 – 2014-06-28 (×4): 75 mg via ORAL
  Filled 2014-06-25 (×5): qty 1

## 2014-06-25 MED ORDER — INSULIN ASPART 100 UNIT/ML ~~LOC~~ SOLN
0.0000 [IU] | Freq: Every day | SUBCUTANEOUS | Status: DC
  Administered 2014-06-26 – 2014-06-27 (×2): 3 [IU] via SUBCUTANEOUS

## 2014-06-25 MED ORDER — ATORVASTATIN CALCIUM 10 MG PO TABS: 10.0000 mg | ORAL_TABLET | Freq: Every day | ORAL | Status: DC

## 2014-06-25 MED ORDER — PRAVASTATIN SODIUM 40 MG PO TABS
40.0000 mg | ORAL_TABLET | Freq: Every day | ORAL | Status: DC
  Administered 2014-06-25 – 2014-06-28 (×4): 40 mg via ORAL
  Filled 2014-06-25 (×4): qty 1

## 2014-06-25 MED ORDER — INSULIN ASPART 100 UNIT/ML ~~LOC~~ SOLN
0.0000 [IU] | Freq: Three times a day (TID) | SUBCUTANEOUS | Status: DC
  Administered 2014-06-25: 7 [IU] via SUBCUTANEOUS
  Administered 2014-06-25 – 2014-06-26 (×2): 4 [IU] via SUBCUTANEOUS
  Administered 2014-06-26: 7 [IU] via SUBCUTANEOUS
  Administered 2014-06-26 – 2014-06-27 (×3): 4 [IU] via SUBCUTANEOUS
  Administered 2014-06-28: 15 [IU] via SUBCUTANEOUS
  Administered 2014-06-28: 4 [IU] via SUBCUTANEOUS
  Administered 2014-06-28: 20 [IU] via SUBCUTANEOUS

## 2014-06-25 MED ORDER — INSULIN ASPART 100 UNIT/ML ~~LOC~~ SOLN
3.0000 [IU] | Freq: Three times a day (TID) | SUBCUTANEOUS | Status: DC
  Administered 2014-06-25 (×3): 3 [IU] via SUBCUTANEOUS
  Administered 2014-06-26: 12:00:00 via SUBCUTANEOUS
  Administered 2014-06-26 – 2014-06-28 (×8): 3 [IU] via SUBCUTANEOUS

## 2014-06-25 MED ORDER — MORPHINE SULFATE 2 MG/ML IJ SOLN
1.0000 mg | INTRAMUSCULAR | Status: DC | PRN
Start: 2014-06-25 — End: 2014-06-25

## 2014-06-25 MED ORDER — RILPIVIRINE HCL 25 MG PO TABS
25.0000 mg | ORAL_TABLET | Freq: Every day | ORAL | Status: DC
  Administered 2014-06-25 – 2014-06-28 (×4): 25 mg via ORAL
  Filled 2014-06-25 (×5): qty 1

## 2014-06-25 MED ORDER — INSULIN ASPART 100 UNIT/ML ~~LOC~~ SOLN: 0.0000 [IU] | Freq: Every day | SUBCUTANEOUS | Status: DC

## 2014-06-25 MED ORDER — LISINOPRIL 5 MG PO TABS
5.0000 mg | ORAL_TABLET | Freq: Every day | ORAL | Status: DC
  Administered 2014-06-25 – 2014-06-28 (×4): 5 mg via ORAL
  Filled 2014-06-25 (×4): qty 1

## 2014-06-25 MED ORDER — EMTRICITABINE-TENOFOVIR DF 200-300 MG PO TABS
1.0000 | ORAL_TABLET | Freq: Every day | ORAL | Status: DC
  Administered 2014-06-25 – 2014-06-28 (×4): 1 via ORAL
  Filled 2014-06-25 (×5): qty 1

## 2014-06-25 MED ORDER — DULOXETINE HCL 60 MG PO CPEP
60.0000 mg | ORAL_CAPSULE | Freq: Every day | ORAL | Status: DC
  Administered 2014-06-25 – 2014-06-28 (×4): 60 mg via ORAL
  Filled 2014-06-25 (×4): qty 1

## 2014-06-25 MED ORDER — IPRATROPIUM-ALBUTEROL 0.5-2.5 (3) MG/3ML IN SOLN: 3.0000 mL | RESPIRATORY_TRACT | Status: DC | PRN

## 2014-06-25 MED ORDER — SODIUM CHLORIDE 0.9 % IV SOLN
INTRAVENOUS | Status: AC
  Administered 2014-06-25: 01:00:00 via INTRAVENOUS

## 2014-06-25 NOTE — ED Notes (Signed)
Dr. Heber Cottondale at the bedside assessing patient

## 2014-06-25 NOTE — Plan of Care (Signed)
Problem: Acute Rehab PT Goals(only PT should resolve) Goal: Pt Will Transfer Bed To Chair/Chair To Bed With LRAD  Goal: Pt Will Ambulate Level surfaces

## 2014-06-25 NOTE — ED Notes (Signed)
CBG 377
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
## 2014-06-25 NOTE — H&P (Signed)
Pt seen and examined with Dr. Marvel Plan and Dr. Aundra Dubin. Case d/w Dr. Trudee Kuster. I agree with findings and plan as documented. Please refer to my note for further details
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
## 2014-06-25 NOTE — Progress Notes (Signed)
Utilization Review Completed.Michael [name] T9/19/2015  

## 2014-06-25 NOTE — ED Notes (Signed)
CBG=418 
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
## 2014-06-26 ENCOUNTER — Observation Stay (HOSPITAL_COMMUNITY): Payer: Medicare Other

## 2014-06-26 DIAGNOSIS — M79609 Pain in unspecified limb: Secondary | ICD-10-CM

## 2014-06-26 LAB — GLUCOSE, CAPILLARY
GLUCOSE-CAPILLARY: 268 mg/dL — AB (ref 70–99)
Glucose-Capillary: 203 mg/dL — ABNORMAL HIGH (ref 70–99)
Glucose-Capillary: 156 mg/dL — ABNORMAL HIGH (ref 70–99)
Glucose-Capillary: 159 mg/dL — ABNORMAL HIGH (ref 70–99)

## 2014-06-26 LAB — URINALYSIS, ROUTINE W REFLEX MICROSCOPIC
Bilirubin Urine: NEGATIVE
Glucose, UA: 500 mg/dL — AB
Hgb urine dipstick: NEGATIVE
Ketones, ur: NEGATIVE mg/dL
LEUKOCYTES UA: NEGATIVE
Nitrite: NEGATIVE
PH: 6.5 (ref 5.0–8.0)
Protein, ur: NEGATIVE mg/dL
SPECIFIC GRAVITY, URINE: 1.017 (ref 1.005–1.030)
Urobilinogen, UA: 1 mg/dL (ref 0.0–1.0)

## 2014-06-26 MED ORDER — SENNOSIDES-DOCUSATE SODIUM 8.6-50 MG PO TABS
1.0000 | ORAL_TABLET | Freq: Once | ORAL | Status: AC
  Administered 2014-06-26: 1 via ORAL
  Filled 2014-06-26: qty 1

## 2014-06-26 MED ORDER — POLYETHYLENE GLYCOL 3350 17 G PO PACK
17.0000 g | PACK | Freq: Once | ORAL | Status: AC
  Administered 2014-06-26: 17 g via ORAL
  Filled 2014-06-26: qty 1

## 2014-06-26 NOTE — Progress Notes (Signed)
Utilization Review Completed.   Vaniah Chambers, RN, BSN Nurse Case Manager  
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
## 2014-06-26 NOTE — Progress Notes (Signed)
VASCULAR LAB PRELIMINARY  PRELIMINARY  PRELIMINARY  PRELIMINARY  Bilateral lower extremity venous duplex completed.    Preliminary report:  Bilateral:  No evidence of DVT, superficial thrombosis, or Baker's Cyst.   Cranston Koors, RVS 06/26/2014, 12:47 PM

## 2014-06-26 NOTE — Care Management Note (Signed)
CARE MANAGEMENT NOTE 06/26/2014  Patient:  KAIYDEN, AFTAB   Account Number:  [id number]  Date Initiated:  06/26/2014  Documentation initiated by:  Marthenia Rolling  Subjective/Objective Assessment:   pt admit with chest pain     Action/Plan:   homeless   Anticipated DC Date:  06/26/2014   Anticipated DC Plan:  [place]  In-house referral  Clinical Social Worker      DC Planning Services  CM consult      Choice offered to / List presented to:             Status of service:  In process, will continue to follow Medicare Important Message given?   (If response is "NO", the following Medicare IM given date fields will be blank) Date Medicare IM given:   Medicare IM given by:   Date Additional Medicare IM given:   Additional Medicare IM given by:    Discharge Disposition:    Per UR Regulation:    If discussed at Long Length of Stay Meetings, dates discussed:    Comments:  06/26/14 Valora Corporal [id number] Patient is homeless and could benefit from short term placement for continued rehab. Unable to assist with home health due to homelessness. Contacted social work, Teacher, music, for referral for possible SNF placement. Nursing contacted to request for therapy to re-evaluate patient. Did not contact DME provider at this time due to possible SNF placement. SNF should re-evaluate for possible DME needs. [email or web address] [phone]

## 2014-06-26 NOTE — Progress Notes (Signed)
Plan of care/discharge recommendations are to consider SNF placement for continued therapy needs to follow up as pt is willing as previously discussed.  Nursing contacted to update the plan as communicated by another staff PT. [name], PT MS Acute Rehab Dept. Number: [id number]
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
## 2014-06-26 NOTE — Clinical Social Work Placement (Addendum)
Clinical Social Work Department CLINICAL SOCIAL WORK PLACEMENT NOTE 06/26/2014  Patient:  Michael [name], Michael [name]  Account Number:  [id number] [place] date:  06/24/2014  Clinical Social Worker:  Carrington Clamp, LCSWA  Date/time:  06/26/2014 06:12 PM  Clinical Social Work is seeking post-discharge placement for this patient at the following level of care:   [place]   (*CSW will update this form in Epic as items are completed)   06/26/2014  Patient/family provided with [place] Department of Clinical Social Work's list of facilities offering this level of care within the geographic area requested by the patient (or if unable, by the patient's family).  06/26/2014  Patient/family informed of their freedom to choose among providers that offer the needed level of care, that participate in Medicare, Medicaid or managed care program needed by the patient, have an available bed and are willing to accept the patient.  06/26/2014  Patient/family informed of [facility]' ownership interest in [facility], as well as of the fact that they are under no obligation to receive care at this facility.  PASARR submitted to EDS on 06/26/2014 PASARR number received on   FL2 transmitted to all facilities in geographic area requested by pt/family on  06/26/2014 FL2 transmitted to all facilities within larger geographic area on 06/27/2014  Patient informed that his/her managed care company has contracts with or will negotiate with  certain facilities, including the following:     Patient/family informed of bed offers received:  06/27/2014 Patient chooses bed at [facility] Physician recommends and patient chooses bed at    Patient to be transferred to [facility] on  06/27/2014 Patient to be transferred to facility by [facility] Patient and family notified of transfer on 06/28/2014 Name of family member notified:  Pt declined for CSW to notify family/friends  The  following physician request were entered in Epic:   Additional Comments:  Chiropodist, [place] Weekend Clinical Social Worker [phone]

## 2014-06-27 ENCOUNTER — Observation Stay (HOSPITAL_COMMUNITY): Payer: Medicare Other

## 2014-06-27 DIAGNOSIS — Q762 Congenital spondylolisthesis: Secondary | ICD-10-CM

## 2014-06-27 DIAGNOSIS — M545 Low back pain, unspecified: Secondary | ICD-10-CM

## 2014-06-27 DIAGNOSIS — K5900 Constipation, unspecified: Secondary | ICD-10-CM

## 2014-06-27 DIAGNOSIS — M542 Cervicalgia: Secondary | ICD-10-CM

## 2014-06-27 DIAGNOSIS — IMO0002 Reserved for concepts with insufficient information to code with codable children: Secondary | ICD-10-CM

## 2014-06-27 LAB — GLUCOSE, CAPILLARY
GLUCOSE-CAPILLARY: 183 mg/dL — AB (ref 70–99)
GLUCOSE-CAPILLARY: 117 mg/dL — AB (ref 70–99)
Glucose-Capillary: 157 mg/dL — ABNORMAL HIGH (ref 70–99)
Glucose-Capillary: 277 mg/dL — ABNORMAL HIGH (ref 70–99)

## 2014-06-27 LAB — HIV-1 RNA QUANT-NO REFLEX-BLD
HIV 1 RNA Quant: 24018 copies/mL — ABNORMAL HIGH (ref <20)
HIV-1 RNA Quant, Log: 4.38 {Log} — ABNORMAL HIGH (ref <1.30)

## 2014-06-27 LAB — T-HELPER CELLS (CD4) COUNT (NOT AT ARMC)
CD4 % Helper T Cell: 37 % (ref 33–55)
CD4 T Cell Abs: 380 /uL — ABNORMAL LOW (ref 400–2700)

## 2014-06-27 MED ORDER — METHYLPREDNISOLONE 4 MG PO KIT
4.0000 mg | PACK | ORAL | Status: AC
  Administered 2014-06-27: 4 mg via ORAL

## 2014-06-27 MED ORDER — NAPROXEN 375 MG PO TABS
375.0000 mg | ORAL_TABLET | Freq: Two times a day (BID) | ORAL | Status: DC | PRN
  Filled 2014-06-27: qty 1

## 2014-06-27 MED ORDER — METHYLPREDNISOLONE 4 MG PO KIT
8.0000 mg | PACK | Freq: Every evening | ORAL | Status: AC
  Administered 2014-06-27: 8 mg via ORAL

## 2014-06-27 MED ORDER — SENNOSIDES-DOCUSATE SODIUM 8.6-50 MG PO TABS
2.0000 | ORAL_TABLET | Freq: Every evening | ORAL | Status: DC | PRN
  Filled 2014-06-27: qty 2

## 2014-06-27 MED ORDER — METHYLPREDNISOLONE 4 MG PO KIT: 4.0000 mg | PACK | Freq: Four times a day (QID) | ORAL | Status: DC

## 2014-06-27 MED ORDER — FAMOTIDINE 20 MG PO TABS
20.0000 mg | ORAL_TABLET | Freq: Every day | ORAL | Status: DC
  Administered 2014-06-27: 20 mg via ORAL
  Filled 2014-06-27 (×2): qty 1

## 2014-06-27 MED ORDER — ETODOLAC 200 MG PO CAPS
200.0000 mg | ORAL_CAPSULE | Freq: Three times a day (TID) | ORAL | Status: DC | PRN
  Filled 2014-06-27: qty 1

## 2014-06-27 MED ORDER — METHYLPREDNISOLONE 4 MG PO KIT
8.0000 mg | PACK | Freq: Every morning | ORAL | Status: AC
  Administered 2014-06-27: 8 mg via ORAL
  Filled 2014-06-27: qty 21

## 2014-06-27 MED ORDER — PANTOPRAZOLE SODIUM 20 MG PO TBEC: 20.0000 mg | DELAYED_RELEASE_TABLET | Freq: Every day | ORAL | Status: DC

## 2014-06-27 MED ORDER — METHYLPREDNISOLONE 4 MG PO KIT
4.0000 mg | PACK | Freq: Three times a day (TID) | ORAL | Status: AC
  Administered 2014-06-28 (×3): 4 mg via ORAL

## 2014-06-27 MED ORDER — METHYLPREDNISOLONE 4 MG PO KIT: 8.0000 mg | PACK | Freq: Every evening | ORAL | Status: DC
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
## 2014-06-27 NOTE — Progress Notes (Signed)
  Date: 06/27/2014  Patient name: Michael [name]  Medical record number: [id number]  Date of birth: 29-Nov-1950   This patient has been seen and the plan of care was discussed with the house staff. Please see their note for complete details. I concur with their findings with the following additions/corrections:  Protonix interacts with other medications, will attempt H2 blocker for prophylaxis given NSAIDs and steroids.  PT notes SNF would be appropriate, CSW is aware and note reviewed.  Pending MRI C spine.   Sid Falcon, MD 06/27/2014, 3:44 PM
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
## 2014-06-27 NOTE — Progress Notes (Signed)
OT Cancellation Note  Patient Details Name: Michael [name] MRN: [id number] DOB: 1950-12-31   Cancelled Treatment:    Reason Eval/Treat Not Completed: OT screened, no acute needs identified.  Spoke with case Metallurgist and pt plans to d/c to SNF today and they do not need OT eval. All further needs can be met in next venue of care.  Benito Mccreedy OTR/L [phone] 06/27/2014, 11:05 AM

## 2014-06-27 NOTE — Progress Notes (Signed)
Clinical Social Work Department BRIEF PSYCHOSOCIAL ASSESSMENT 06/27/2014  Patient:  Michael [name], Michael [name]     Account Number:  [id number]     Admit date:  06/24/2014  Clinical Social Worker:  Adair Laundry  Date/Time:  06/27/2014 10:00 AM  Referred by:  Physician  Date Referred:  06/27/2014 Referred for  SNF Placement   Other Referral:   Interview type:  Patient Other interview type:    PSYCHOSOCIAL DATA Living Status:  ALONE Admitted from facility:   Level of care:   Primary support name:  Marisue [place] Primary support relationship to patient:  S[place]LING Degree of support available:   Pt has limited support    CURRENT CONCERNS Current Concerns  Post-Acute Placement   Other Concerns:    SOCIAL WORK ASSESSMENT / PLAN CSW visited pt room and spoke with pt about PT recommendation. Pt confirmed that he is currently staying in a hotel. CSW asked if pt would want to consider option of ST rehab at [facility] because home health services not available in hotel. Pt stated he would prefer SNF. CSW explained SNF referral process. CSW did express to pt that CSW [place] have to likely search out of county. Pt stated he would be agreeable to dc anywhere as long as someone would assist with him being transported to facility. CSW notified that this could be worked out.   Assessment/plan status:  Psychosocial Support/Ongoing Assessment of Needs Other assessment/ plan:   Information/referral to community resources:   SNF list to be provided with bed offers    PATIENT'S/FAMILY'S RESPONSE TO PLAN OF CARE: Pt pleasant and coopeartive. Pt is agreeable to SNF.       [place], [place]
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
## 2014-06-27 NOTE — Progress Notes (Signed)
CSW [employer] technical officer) spoke with pt and provided bed offers. Pt has chosen to accept bed at [facility]. Pt notified CSW that per MD plan will not be for dc today. CSW informed pt that CSW would continue to work with facility and pt can dc when medically stable.  [place], [place]

## 2014-06-28 DIAGNOSIS — M542 Cervicalgia: Secondary | ICD-10-CM | POA: Diagnosis present

## 2014-06-28 DIAGNOSIS — M5416 Radiculopathy, lumbar region: Secondary | ICD-10-CM

## 2014-06-28 LAB — GLUCOSE, CAPILLARY
GLUCOSE-CAPILLARY: 389 mg/dL — AB (ref 70–99)
GLUCOSE-CAPILLARY: 199 mg/dL — AB (ref 70–99)
Glucose-Capillary: 326 mg/dL — ABNORMAL HIGH (ref 70–99)

## 2014-06-28 MED ORDER — METHYLPREDNISOLONE (PAK) 4 MG PO TABS: ORAL_TABLET | ORAL | Status: DC

## 2014-06-28 MED ORDER — SENNOSIDES-DOCUSATE SODIUM 8.6-50 MG PO TABS: 2.0000 | ORAL_TABLET | Freq: Every evening | ORAL | Status: DC | PRN

## 2014-06-28 MED ORDER — FAMOTIDINE 20 MG PO TABS: 20.0000 mg | ORAL_TABLET | Freq: Every day | ORAL | Status: DC

## 2014-06-28 MED ORDER — SENNOSIDES-DOCUSATE SODIUM 8.6-50 MG PO TABS
2.0000 | ORAL_TABLET | Freq: Once | ORAL | Status: AC
  Administered 2014-06-28: 2 via ORAL
  Filled 2014-06-28: qty 2

## 2014-06-28 MED ORDER — DICLOFENAC SODIUM 25 MG PO TBEC: 25.0000 mg | DELAYED_RELEASE_TABLET | Freq: Three times a day (TID) | ORAL | Status: DC | PRN

## 2014-06-28 MED ORDER — ASPIRIN EC 81 MG PO TBEC: 81.0000 mg | DELAYED_RELEASE_TABLET | Freq: Every day | ORAL | Status: DC
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
## 2014-06-28 NOTE — Progress Notes (Signed)
CSW (Clinical Education officer, museum) prepared pt dc packet and placed with shadow chart. CSW arranged non-emergent ambulance transport. Pt, pt nurse, and facility informed. Pt to notify family/friends CSW signing off.  .pooo

## 2014-06-28 NOTE — Progress Notes (Signed)
  Date: 06/28/2014  Patient name: Michael [name]  Medical record number: [id number]  Date of birth: 10-02-1951   This patient has been seen and the plan of care was discussed with the house staff. Please see their note for complete details. I concur with their findings with the following additions/corrections:  Patient ready for discharge to SNF today.  Will have good follow up with specialty providers including neurosurgery, Cardiology and ID.  Sid Falcon, MD 06/28/2014, 2:12 PM

## 2014-06-28 NOTE — Progress Notes (Addendum)
Inpatient Diabetes Program Recommendations  AACE/ADA: New Consensus Statement on Inpatient Glycemic Control (2013)  Target Ranges:  Prepandial:   less than 140 mg/dL      Peak postprandial:   less than 180 mg/dL (1-2 hours)      Critically ill patients:  140 - 180 mg/dL     Results for Michael [name], Michael [name] (MRN [id number]) as of 06/28/2014 11:58  Ref. Range 06/27/2014 07:53 06/27/2014 12:09 06/27/2014 17:18 06/27/2014 21:00  Glucose-Capillary Latest Range: 70-99 mg/dL 157 (H) 183 (H) 117 (H) 277 (H)    Results for Michael [name], Michael [name] (MRN [id number]) as of 06/28/2014 11:58  Ref. Range 06/28/2014 07:40 06/28/2014 11:23  Glucose-Capillary Latest Range: 70-99 mg/dL 326 (H) 389 (H)     Patient started on Medrol dose pack yesterday at 5pm.  Having significant glucose elevations.  Eating 100% of meals.   MD- Please consider the following insulin adjustments while patient getting steroids:  1. Increase Lantus to 30 units QHS 2. Increase Novolog Meal Coverage to 5 units tid with meals (currently ordered as Novolog 3 units tid with meals)    Will follow Wyn Quaker RN, MSN, CDE Diabetes Coordinator Inpatient Diabetes Program Team Pager: [phone] (8a-10p)
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
## 2014-06-28 NOTE — Discharge Instructions (Signed)
Thank you for allowing [place] to be involved in your healthcare while you were hospitalized at [facility]   Please note that there have been changes to your home medications.  --> PLEASE LOOK AT YOUR DISCHARGE MEDICATION LIST FOR DETAILS.   Please call your PCP if you have any questions or concerns, or any difficulty getting any of your medications.  Please return to the ER if you have worsening of your symptoms or new severe symptoms arise.  Please attend you follow up appointments with [name], Dr. Arnoldo Morale and with Cardiology and the [facility].   For your neck and back pain, we have prescribed Diclofenac for pain. You can take 25 mg (1 pill) up to 3 times a day AS NEEDED for pain. Please take these pills with food as it can upset your stomach.  I have prescribed Pepcid as a protective medication for your stomach. Please take this pill 12 hours AFTER OR BEFORE you take your COMPLERA. Pepcid can interact with your Complera, so please take them 12 hours apart.  I have also prescribed a Steroid Dose Pack that will help ease your pain. Please take according to the package. Steroids can increase your blood sugar, so please be aware and check this.   PLEASE STOP TAKING YOUR METOPROLOL. You will no longer take metoprolol or any other type of beta blocker.  Cervical Radiculopathy Cervical radiculopathy happens when a nerve in the neck is pinched or bruised by a slipped (herniated) disk or by arthritic changes in the bones of the cervical spine. This can occur due to an injury or as part of the normal aging process. Pressure on the cervical nerves can cause pain or numbness that runs from your neck all the way down into your arm and fingers. CAUSES  There are many possible causes, including:  Injury.  Muscle tightness in the neck from overuse.  Swollen, painful joints (arthritis).  Breakdown or degeneration in the bones and joints of the spine (spondylosis)  due to aging.  Bone spurs that may develop near the cervical nerves. SYMPTOMS  Symptoms include pain, weakness, or numbness in the affected arm and hand. Pain can be severe or irritating. Symptoms may be worse when extending or turning the neck. DIAGNOSIS  Your caregiver will ask about your symptoms and do a physical exam. He or she may test your strength and reflexes. X-rays, CT scans, and MRI scans may be needed in cases of injury or if the symptoms do not go away after a period of time. Electromyography (EMG) or nerve conduction testing may be done to study how your nerves and muscles are working. TREATMENT  Your caregiver may recommend certain exercises to help relieve your symptoms. Cervical radiculopathy can, and often does, get better with time and treatment. If your problems continue, treatment options may include:  Wearing a soft collar for short periods of time.  Physical therapy to strengthen the neck muscles.  Medicines, such as nonsteroidal anti-inflammatory drugs (NSAIDs), oral corticosteroids, or spinal injections.  Surgery. Different types of surgery may be done depending on the cause of your problems. HOME CARE INSTRUCTIONS   Put ice on the affected area.  Put ice in a plastic bag.  Place a towel between your skin and the bag.  Leave the ice on for 15-20 minutes, 03-04 times a day or as directed by your caregiver.  If ice does not help, you can try using heat. Take a warm shower or bath,  or use a hot water bottle as directed by your caregiver.  You may try a gentle neck and shoulder massage.  Use a flat pillow when you sleep.  Only take over-the-counter or prescription medicines for pain, discomfort, or fever as directed by your caregiver.  If physical therapy was prescribed, follow your caregiver's directions.  If a soft collar was prescribed, use it as directed. SEEK IMMEDIATE MEDICAL CARE IF:   Your pain gets much worse and cannot be controlled with  medicines.  You have weakness or numbness in your hand, arm, face, or leg.  You have a high fever or a stiff, rigid neck.  You lose bowel or bladder control (incontinence).  You have trouble with walking, balance, or speaking. MAKE SURE YOU:   Understand these instructions.  Will watch your condition.  Will get help right away if you are not doing well or get worse. Document Released: 06/18/2001 Document Revised: 12/16/2011 Document Reviewed: 05/07/2011 [facility] Patient Information 2015 [place], [place]. This information is not intended to replace advice given to you by your health care provider. Make sure you discuss any questions you have with your health care provider.  Lumbosacral Radiculopathy Lumbosacral radiculopathy is a pinched nerve or nerves in the low back (lumbosacral area). When this happens you may have weakness in your legs and may not be able to stand on your toes. You may have pain going down into your legs. There may be difficulties with walking normally. There are many causes of this problem. Sometimes this may happen from an injury, or simply from arthritis or boney problems. It may also be caused by other illnesses such as diabetes. If there is no improvement after treatment, further studies may be done to find the exact cause. DIAGNOSIS  X-rays may be needed if the problems become long standing. Electromyograms may be done. This study is one in which the working of nerves and muscles is studied. HOME CARE INSTRUCTIONS   Applications of ice packs may be helpful. Ice can be used in a plastic bag with a towel around it to prevent frostbite to skin. This may be used every 2 hours for 20 to 30 minutes, or as needed, while awake, or as directed by your caregiver.  Only take over-the-counter or prescription medicines for pain, discomfort, or fever as directed by your caregiver.  If physical therapy was prescribed, follow your caregiver's directions. SEEK IMMEDIATE MEDICAL  CARE IF:   You have pain not controlled with medications.  You seem to be getting worse rather than better.  You develop increasing weakness in your legs.  You develop loss of bowel or bladder control.  You have difficulty with walking or balance, or develop clumsiness in the use of your legs.  You have a fever. MAKE SURE YOU:   Understand these instructions.  Will watch your condition.  Will get help right away if you are not doing well or get worse. Document Released: 09/23/2005 Document Revised: 12/16/2011 Document Reviewed: 05/13/2008 [facility] Patient Information 2015 [place], [place]. This information is not intended to replace advice given to you by your health care provider. Make sure you discuss any questions you have with your health care provider.
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
## 2014-06-29 NOTE — Discharge Summary (Signed)
I saw Michael [name] on day of discharge and assisted in the discharge planning.

## 2014-07-07 ENCOUNTER — Encounter: Payer: Self-pay | Admitting: Internal Medicine

## 2014-07-07 ENCOUNTER — Ambulatory Visit: Payer: Self-pay | Admitting: Internal Medicine

## 2014-07-12 ENCOUNTER — Other Ambulatory Visit: Payer: Self-pay

## 2014-07-12 ENCOUNTER — Encounter: Payer: Self-pay | Admitting: *Deleted

## 2014-07-12 NOTE — Progress Notes (Signed)
Late entry for missed G-code. Based on review of the evaluation and goals by [name], PT.  07-25-2014 1705  PT G-Codes **NOT FOR INPATIENT CLASS**  Functional Assessment Tool Used clinical judgement based on chart review  Functional Limitation Mobility: Walking and moving around  Mobility: Walking and Moving Around Current Status [id number]) CJ  Mobility: Walking and Moving Around Goal Status [phone]) CI  Lavonia Dana, PT  [phone] 07/12/2014

## 2014-07-13 ENCOUNTER — Encounter: Payer: Self-pay | Admitting: Cardiovascular Disease

## 2014-07-13 ENCOUNTER — Ambulatory Visit (INDEPENDENT_AMBULATORY_CARE_PROVIDER_SITE_OTHER): Payer: Medicare Other | Admitting: Cardiovascular Disease

## 2014-07-13 VITALS — BP 123/75 | HR 96 | Resp 16 | Ht 67.5 in | Wt 176.3 lb

## 2014-07-13 DIAGNOSIS — E782 Mixed hyperlipidemia: Secondary | ICD-10-CM

## 2014-07-13 DIAGNOSIS — I2510 Atherosclerotic heart disease of native coronary artery without angina pectoris: Secondary | ICD-10-CM

## 2014-07-13 DIAGNOSIS — Z79899 Other long term (current) drug therapy: Secondary | ICD-10-CM

## 2014-07-13 DIAGNOSIS — I10 Essential (primary) hypertension: Secondary | ICD-10-CM

## 2014-07-13 DIAGNOSIS — E785 Hyperlipidemia, unspecified: Secondary | ICD-10-CM

## 2014-07-13 MED ORDER — ISOSORBIDE MONONITRATE ER 30 MG PO TB24
30.0000 mg | ORAL_TABLET | Freq: Every day | ORAL | Status: DC
Start: 2014-07-13 — End: 2014-08-18

## 2014-07-13 NOTE — Patient Instructions (Signed)
START Isosorbide 30mg  daily.  Your physician recommends that you return for lab work in: [place] before your next visit at [facility] lab.  Dr. Sallyanne Kuster recommends that you schedule a follow-up appointment in: 4 months.

## 2014-07-14 ENCOUNTER — Encounter: Payer: Self-pay | Admitting: Cardiovascular Disease

## 2014-07-14 NOTE — Assessment & Plan Note (Addendum)
History of anterior wall STEMI with subsequent return with in-stent restenosis and generates 2015, now with complaints of mild exertional angina pectoris. His recent nuclear stress test showed only minimal peri-infarct ischemia in the anterior wall. Suspect his angina pectoris is secondary to residual disease and small branch vessels (95% PDA and PLA, 75% diagonal 2 and OM 2). Will try antianginal therapy. Isosorbide mononitrate 30 mg daily. I think we need to avoid Ranexa due to the risk of interaction with his antiretrovirals. Continue Plavix for at least another 3 months, possibly indefinitely.

## 2014-07-14 NOTE — Progress Notes (Signed)
Reason for office visit CAD followup    Michael [name] had 2 drug-eluting stents placed in the LAD artery in January 2015 when he presented with in-stent restenosis in a bare-metal stent that was placed at the time of ST segment elevation myocardial infarction in September 2014. He is also known to have disease in the smaller vessels that are not amenable to revascularization (95% PDA, 95% PLV branches of the right coronary artery and 75% OM 2 branch of the left circumflex and 75% second diagonal branch of the LAD. He has preserved left ventricular ejection fraction of 60%. He has a medium-size anterior wall infarction with very minimal peri-infarct ischemia by nuclear stress test in April 2015.   He was last hospitalized in September 2015 when his complaints were likely related to sudden cessation of medications related to social issues. He has a past history of crack cocaine use for over 20 years, but has been "clean" since June of 2015. He lives in a hotel (now at the [facility]).  He still has complaints of fairly typical exertional angina pectoris. Whenever he walks a longer distance or a faster pace he develops chest discomfort that is relieved at rest. Beta blockers are being avoided secondary to his history of cocaine abuse. He does take a low dose of diltiazem as his only antianginal.  He has a history of HIV disease followed by [name], hyperlipidemia, gout and depression. He has had previous cervical spine surgery with placement of rods and lumbar spine surgery in the remote past.   No Known Allergies  Current Outpatient Prescriptions  Medication Sig Dispense Refill  . allopurinol (ZYLOPRIM) 100 MG tablet Take 200 mg by mouth daily.      Marland Kitchen aspirin EC 81 MG tablet Take 81 mg by mouth daily.      . clopidogrel (PLAVIX) 75 MG tablet Take 75 mg by mouth daily.      . diclofenac (VOLTAREN) 25 MG EC tablet Take 1 tablet (25 mg total) by mouth 3 (three) times daily as needed.   90 tablet  0  . diclofenac sodium (VOLTAREN) 1 % GEL Apply 2 g topically daily as needed (pain).      Marland Kitchen diltiazem (DILACOR XR) 120 MG 24 hr capsule Take 120 mg by mouth daily.      . DULoxetine (CYMBALTA) 60 MG capsule Take 60 mg by mouth daily.      . Emtricitab-Rilpivir-Tenofovir (COMPLERA) 200-25-300 MG TABS Take 1 tablet by mouth daily.      . famotidine (PEPCID) 20 MG tablet Take 1 tablet (20 mg total) by mouth daily at 8 pm.  30 tablet  3  . gabapentin (NEURONTIN) 300 MG capsule Take 300 mg by mouth 3 (three) times daily.      . insulin aspart (NOVOLOG) 100 UNIT/ML injection Inject 4 Units into the skin 3 (three) times daily before meals.       . insulin glargine (LANTUS) 100 UNIT/ML injection Inject 30 Units into the skin at bedtime.      Marland Kitchen ipratropium-albuterol (DUONEB) 0.5-2.5 (3) MG/3ML SOLN Take 3 mLs by nebulization every 4 (four) hours as needed (shortness of breath).      Marland Kitchen lisinopril (PRINIVIL,ZESTRIL) 10 MG tablet Take 5 mg by mouth daily.       . metFORMIN (GLUCOPHAGE) 1000 MG tablet Take 1,000 mg by mouth 2 (two) times daily with a meal.      . nitroGLYCERIN (NITROSTAT) 0.4 MG SL tablet Place 0.4  mg under the tongue every 5 (five) minutes as needed for chest pain.      . pravastatin (PRAVACHOL) 40 MG tablet Take 40 mg by mouth daily.      Marland Kitchen senna-docusate (SENOKOT-S) 8.6-50 MG per tablet Take 2 tablets by mouth at bedtime as needed for mild constipation.  12 tablet  0  . traMADol (ULTRAM) 50 MG tablet Take 50 mg by mouth every 6 (six) hours as needed for moderate pain.      . isosorbide mononitrate (IMDUR) 30 MG 24 hr tablet Take 1 tablet (30 mg total) by mouth daily.  30 tablet  6   No current facility-administered medications for this visit.    Past Medical History  Diagnosis Date  . Diabetes mellitus     Diagnosed in 2002, started insulin in 2012  . Hypertension   . Headache(784.0)     CT head 08/2011: Periventricular and subcortical white matter hypodensities are most  in keeping with chronic microangiopathic change  . Gout   . Hyperlipidemia   . HIV infection Nov 2012    Followed by [name]  . Crack cocaine use     for 20+ years, has been enrolled in detox programs in the past  . Chondromalacia of medial femoral condyle     Left knee MRI 04/28/12: Chondromalacia of the medial femoral condyle with slight peripheral degeneration of the meniscocapsular junction of the medial meniscus; followed by sports medicine  . Asthma     No PFTs, history of childhood asthma  . Depression     with history of hospitalization for suicidal ideation  . CAD (coronary artery disease)     a. 06/2013 STEMI/PCI ([facility]): LAD w/ thrombus (treated with BMS), mid 75%, D2 75%; LCX OM2 75%; RCA small, PDA 95%, PLV 95%;  b. 10/2013 Cath/PCI: ISR w/in LAD (Promus DES x 2), borderline OM2 lesion;  c. 01/2014 MV: Intermediate risk, medium-sized distal ant wall infarct w/ very small amt of peri-infarct ischemia. EF 60%.  . Gout 04/28/2012  . Collagen vascular disease   . Cellulitis 04/2014    left facial    Past Surgical History  Procedure Laterality Date  . Prostate surgery    . Bowel resection    . Back surgery      1988  . Cardiac surgery    . Cervical spine surgery      " rods in my neck "  . Coronary stent placement    . Nm myocar perf wall motion  12/27/2011    normal    Family History  Problem Relation Age of Onset  . Diabetes Mother   . Diabetes Father   . Diabetes Brother   . Colon cancer Neg Hx     History   Social History  . Marital Status: Divorced    Spouse Name: N/A    Number of Children: N/A  . Years of Education: N/A   Occupational History  . Not on file.   Social History Main Topics  . Smoking status: Never Smoker   . Smokeless tobacco: Never Used  . Alcohol Use: No  . Drug Use: Yes    Special: "Crack" cocaine, Cocaine     Comment: last used crack 04/2014.  Marland Kitchen Sexual Activity: No     Comment: accepted condoms   Other Topics Concern  . Not on  file   Social History Narrative   Currently staying with a friend in [place].  Was staying @ local motel until a  few days ago - left b/c of bed bugs.    Review of systems: Exertional angina, CCS class II. The patient specifically denies any chest pain at rest, dyspnea at rest or with exertion, orthopnea, paroxysmal nocturnal dyspnea, syncope, palpitations, focal neurological deficits, intermittent claudication, lower extremity edema, unexplained weight gain, cough, hemoptysis or wheezing.  The patient also denies abdominal pain, nausea, vomiting, dysphagia, diarrhea, constipation, polyuria, polydipsia, dysuria, hematuria, frequency, urgency, abnormal bleeding or bruising, fever, chills, unexpected weight changes, mood swings, change in skin or hair texture, change in voice quality, auditory or visual problems, allergic reactions or rashes, new musculoskeletal complaints other than usual "aches and pains".   PHYSICAL EXAM BP 123/75  Pulse 96  Resp 16  Ht 5' 7.5" (1.715 m)  Wt 79.969 kg (176 lb 4.8 oz)  BMI 27.19 kg/m2  General: Alert, oriented x3, no distress Head: no evidence of trauma, PERRL, EOMI, no exophtalmos or lid lag, no myxedema, no xanthelasma; normal ears, nose and oropharynx Neck: normal jugular venous pulsations and no hepatojugular reflux; brisk carotid pulses without delay and no carotid bruits Chest: clear to auscultation, no signs of consolidation by percussion or palpation, normal fremitus, symmetrical and full respiratory excursions Cardiovascular: normal position and quality of the apical impulse, regular rhythm, normal first and second heart sounds, no murmurs, rubs or gallops Abdomen: no tenderness or distention, no masses by palpation, no abnormal pulsatility or arterial bruits, normal bowel sounds, no hepatosplenomegaly Extremities: no clubbing, cyanosis or edema; 2+ radial, ulnar and brachial pulses bilaterally; 2+ right femoral, posterior tibial and dorsalis pedis  pulses; 2+ left femoral, posterior tibial and dorsalis pedis pulses; no subclavian or femoral bruits Neurological: grossly nonfocal   EKG: normal ECG  Lipid Panel     Component Value Date/Time   CHOL 146 11/02/2013 1019   TRIG 124 11/02/2013 1019   HDL 42 11/02/2013 1019   CHOLHDL 3.5 11/02/2013 1019   VLDL 25 11/02/2013 1019   LDLCALC 79 11/02/2013 1019    BMET    Component Value Date/Time   NA 137 06/25/2014 0815   K 4.1 06/25/2014 0815   CL 102 06/25/2014 0815   CO2 25 06/25/2014 0815   GLUCOSE 389* 06/25/2014 0815   BUN 18 06/25/2014 0815   CREATININE 0.97 06/25/2014 0815   CREATININE 1.13 11/02/2013 1019   CALCIUM 8.6 06/25/2014 0815   GFRNONAA 85* 06/25/2014 0815   GFRNONAA 69 11/02/2013 1019   GFRAA >90 06/25/2014 0815   GFRAA 80 11/02/2013 1019     ASSESSMENT AND PLAN Hypertension goal BP (blood pressure) < 140/80 Excellent control. If he manages to stay cocaine free, carvedilol will be an excellent agent.  Hyperlipidemia with target LDL less than 100 Despite poor recent glycemic control, his lipid profile has been excellent. Continue the current statin. Pravastatin is a excellent choice since it would have decreased chance of interaction with his anti-retroviral medications.  3-vessel CAD History of anterior wall STEMI with subsequent return with in-stent restenosis and generates 2015, now with complaints of mild exertional angina pectoris. His recent nuclear stress test showed only minimal peri-infarct ischemia in the anterior wall. Suspect his angina pectoris is secondary to residual disease and small branch vessels (95% PDA and PLA, 75% diagonal 2 and OM 2). Will try antianginal therapy. Isosorbide mononitrate 30 mg daily. I think we need to avoid Ranexa due to the risk of interaction with his antiretrovirals. Continue Plavix for at least another 3 months, possibly indefinitely.   Orders Placed This Encounter  Procedures  . Comprehensive metabolic panel  . Lipid panel    Meds ordered this encounter  Medications  . isosorbide mononitrate (IMDUR) 30 MG 24 hr tablet    Sig: Take 1 tablet (30 mg total) by mouth daily.    Dispense:  30 tablet    Refill:  [place] Ryanna Teschner, MD, [facility] [phone] office [phone] pager

## 2014-07-14 NOTE — Assessment & Plan Note (Signed)
Excellent control. If he manages to stay cocaine free, carvedilol will be an excellent agent.

## 2014-07-14 NOTE — Assessment & Plan Note (Signed)
Despite poor recent glycemic control, his lipid profile has been excellent. Continue the current statin. Pravastatin is a excellent choice since it would have decreased chance of interaction with his anti-retroviral medications.

## 2014-07-18 ENCOUNTER — Other Ambulatory Visit: Payer: Self-pay

## 2014-07-25 ENCOUNTER — Telehealth: Payer: Self-pay | Admitting: *Deleted

## 2014-07-25 NOTE — Telephone Encounter (Signed)
Request Flex pens Has an appt schedued  In November .

## 2014-07-26 ENCOUNTER — Ambulatory Visit: Payer: Self-pay | Admitting: Infectious Diseases

## 2014-07-26 ENCOUNTER — Encounter: Payer: Self-pay | Admitting: Pharmacist

## 2014-07-26 MED ORDER — INSULIN GLARGINE 100 UNIT/ML ~~LOC~~ SOLN: 30.0000 [IU] | Freq: Every day | SUBCUTANEOUS | Status: DC

## 2014-07-26 NOTE — Telephone Encounter (Addendum)
Call from [place], at [facility], pt has been Lantus Flex Pens not vials. Verbal order given to change from vials to pens if not ok let me know. You might want to change on pt's med list.  Thanks

## 2014-07-27 ENCOUNTER — Encounter (HOSPITAL_COMMUNITY): Payer: Self-pay | Admitting: Emergency Medicine

## 2014-07-27 ENCOUNTER — Emergency Department (HOSPITAL_COMMUNITY)
Admission: EM | Admit: 2014-07-27 | Discharge: 2014-07-27 | Disposition: A | Discharge status: Home / Self Care | Payer: Medicare Other | Attending: Emergency Medicine | Admitting: Emergency Medicine

## 2014-07-27 DIAGNOSIS — E785 Hyperlipidemia, unspecified: Secondary | ICD-10-CM | POA: Diagnosis not present

## 2014-07-27 DIAGNOSIS — J45909 Unspecified asthma, uncomplicated: Secondary | ICD-10-CM | POA: Insufficient documentation

## 2014-07-27 DIAGNOSIS — Z794 Long term (current) use of insulin: Secondary | ICD-10-CM | POA: Diagnosis not present

## 2014-07-27 DIAGNOSIS — F329 Major depressive disorder, single episode, unspecified: Secondary | ICD-10-CM | POA: Diagnosis not present

## 2014-07-27 DIAGNOSIS — Z79899 Other long term (current) drug therapy: Secondary | ICD-10-CM | POA: Diagnosis not present

## 2014-07-27 DIAGNOSIS — M79675 Pain in left toe(s): Secondary | ICD-10-CM | POA: Diagnosis present

## 2014-07-27 DIAGNOSIS — Z7982 Long term (current) use of aspirin: Secondary | ICD-10-CM | POA: Insufficient documentation

## 2014-07-27 DIAGNOSIS — B353 Tinea pedis: Secondary | ICD-10-CM | POA: Diagnosis not present

## 2014-07-27 DIAGNOSIS — Z21 Asymptomatic human immunodeficiency virus [HIV] infection status: Secondary | ICD-10-CM | POA: Diagnosis not present

## 2014-07-27 DIAGNOSIS — I10 Essential (primary) hypertension: Secondary | ICD-10-CM | POA: Insufficient documentation

## 2014-07-27 DIAGNOSIS — I2510 Atherosclerotic heart disease of native coronary artery without angina pectoris: Secondary | ICD-10-CM | POA: Insufficient documentation

## 2014-07-27 DIAGNOSIS — M109 Gout, unspecified: Secondary | ICD-10-CM | POA: Diagnosis not present

## 2014-07-27 DIAGNOSIS — E119 Type 2 diabetes mellitus without complications: Secondary | ICD-10-CM | POA: Insufficient documentation

## 2014-07-27 DIAGNOSIS — Z9861 Coronary angioplasty status: Secondary | ICD-10-CM | POA: Insufficient documentation

## 2014-07-27 DIAGNOSIS — Z7902 Long term (current) use of antithrombotics/antiplatelets: Secondary | ICD-10-CM | POA: Diagnosis not present

## 2014-07-27 DIAGNOSIS — Z872 Personal history of diseases of the skin and subcutaneous tissue: Secondary | ICD-10-CM | POA: Insufficient documentation

## 2014-07-27 MED ORDER — CLOTRIMAZOLE 1 % EX CREA: TOPICAL_CREAM | CUTANEOUS | Status: DC

## 2014-07-27 NOTE — ED Notes (Signed)
Pt. reports pain at left toes for 3 weeks , denies injury , skin intact , ambulatory.

## 2014-07-27 NOTE — ED Provider Notes (Signed)
CSN: [id number]     Arrival date & time 07/27/14  2152 History   First MD Initiated Contact with Patient 07/27/14 2218     This chart was scribed for non-physician practitioner, Caryl Ada PA-C working with Blanchie Dessert, MD by Forrestine Him, ED Scribe. This patient was seen in room TR07C/TR07C and the patient's care was started at 10:24 PM.   Chief Complaint  Patient presents with  . Toe Pain   Patient is a 63 y.o. male presenting with toe pain. The history is provided by the patient. No language interpreter was used.  Toe Pain This is a new problem. The current episode started more than 1 week ago. The problem occurs constantly. The problem has not changed since onset.Nothing aggravates the symptoms. Nothing relieves the symptoms.    HPI Comments: Michael [name] is a 63 y.o. male with multiple medical problems as listed below who presents to the Emergency Department complaining of constant, moderate L toe pain x 3 weeks that is unchanged. He denies any recent injury or trauma to the feet. Pt states "my toes are just very sore". He has not applied any OTC topical mediations to help manage symptoms. No fever or chills at this time. No known allergies to medications. No other concerns this visit.  Past Medical History  Diagnosis Date  . Diabetes mellitus     Diagnosed in 2002, started insulin in 2012  . Hypertension   . Headache(784.0)     CT head 08/2011: Periventricular and subcortical white matter hypodensities are most in keeping with chronic microangiopathic change  . Gout   . Hyperlipidemia   . HIV infection Nov 2012    Followed by [name]  . Crack cocaine use     for 20+ years, has been enrolled in detox programs in the past  . Chondromalacia of medial femoral condyle     Left knee MRI 04/28/12: Chondromalacia of the medial femoral condyle with slight peripheral degeneration of the meniscocapsular junction of the medial meniscus; followed by sports medicine  . Asthma      No PFTs, history of childhood asthma  . Depression     with history of hospitalization for suicidal ideation  . CAD (coronary artery disease)     a. 06/2013 STEMI/PCI ([facility]): LAD w/ thrombus (treated with BMS), mid 75%, D2 75%; LCX OM2 75%; RCA small, PDA 95%, PLV 95%;  b. 10/2013 Cath/PCI: ISR w/in LAD (Promus DES x 2), borderline OM2 lesion;  c. 01/2014 MV: Intermediate risk, medium-sized distal ant wall infarct w/ very small amt of peri-infarct ischemia. EF 60%.  . Gout 04/28/2012  . Collagen vascular disease   . Cellulitis 04/2014    left facial   Past Surgical History  Procedure Laterality Date  . Prostate surgery    . Bowel resection    . Back surgery      1988  . Cardiac surgery    . Cervical spine surgery      " rods in my neck "  . Coronary stent placement    . Nm myocar perf wall motion  12/27/2011    normal   Family History  Problem Relation Age of Onset  . Diabetes Mother   . Diabetes Father   . Diabetes Brother   . Colon cancer Neg Hx    History  Substance Use Topics  . Smoking status: Never Smoker   . Smokeless tobacco: Never Used  . Alcohol Use: No    Review of Systems  Constitutional: Negative for fever and chills.  Musculoskeletal: Positive for arthralgias.  All other systems reviewed and are negative.     Allergies  Review of patient's allergies indicates no known allergies.  Home Medications   Prior to Admission medications   Medication Sig Start Date End Date Taking? Authorizing Provider  allopurinol (ZYLOPRIM) 100 MG tablet Take 200 mg by mouth daily.    Historical Provider, MD  aspirin EC 81 MG tablet Take 81 mg by mouth daily.    Historical Provider, MD  clopidogrel (PLAVIX) 75 MG tablet Take 75 mg by mouth daily.    Historical Provider, MD  diclofenac (VOLTAREN) 25 MG EC tablet Take 1 tablet (25 mg total) by mouth 3 (three) times daily as needed. 06/28/14   Osa Craver, MD  diclofenac sodium (VOLTAREN) 1 % GEL Apply 2 g topically daily  as needed (pain).    Historical Provider, MD  diltiazem (DILACOR XR) 120 MG 24 hr capsule Take 120 mg by mouth daily.    Historical Provider, MD  DULoxetine (CYMBALTA) 60 MG capsule Take 60 mg by mouth daily.    Historical Provider, MD  Emtricitab-Rilpivir-Tenofovir (COMPLERA) 200-25-300 MG TABS Take 1 tablet by mouth daily.    Historical Provider, MD  famotidine (PEPCID) 20 MG tablet Take 1 tablet (20 mg total) by mouth daily at 8 pm. 06/28/14   Osa Craver, MD  gabapentin (NEURONTIN) 300 MG capsule Take 300 mg by mouth 3 (three) times daily.    Historical Provider, MD  insulin aspart (NOVOLOG) 100 UNIT/ML injection Inject 4 Units into the skin 3 (three) times daily before meals.     Historical Provider, MD  Insulin Glargine (LANTUS) 100 UNIT/ML Solostar Pen Inject 30 Units into the skin at bedtime.    Historical Provider, MD  ipratropium-albuterol (DUONEB) 0.5-2.5 (3) MG/3ML SOLN Take 3 mLs by nebulization every 4 (four) hours as needed (shortness of breath).    Historical Provider, MD  isosorbide mononitrate (IMDUR) 30 MG 24 hr tablet Take 1 tablet (30 mg total) by mouth daily. 07/13/14   Mihai Croitoru, MD  lisinopril (PRINIVIL,ZESTRIL) 10 MG tablet Take 5 mg by mouth daily.     Historical Provider, MD  metFORMIN (GLUCOPHAGE) 1000 MG tablet Take 1,000 mg by mouth 2 (two) times daily with a meal.    Historical Provider, MD  nitroGLYCERIN (NITROSTAT) 0.4 MG SL tablet Place 0.4 mg under the tongue every 5 (five) minutes as needed for chest pain.    Historical Provider, MD  pravastatin (PRAVACHOL) 40 MG tablet Take 40 mg by mouth daily.    Historical Provider, MD  senna-docusate (SENOKOT-S) 8.6-50 MG per tablet Take 2 tablets by mouth at bedtime as needed for mild constipation. 06/28/14   Osa Craver, MD  traMADol (ULTRAM) 50 MG tablet Take 50 mg by mouth every 6 (six) hours as needed for moderate pain.    Historical Provider, MD   Triage Vitals: BP 136/81  Pulse 103  Temp(Src) 98.1 F  (36.7 C) (Oral)  Resp 14  Ht 5' 7.5" (1.715 m)  Wt 176 lb (79.833 kg)  BMI 27.14 kg/m2  SpO2 98%   Physical Exam  Nursing note and vitals reviewed. Constitutional: He is oriented to person, place, and time. He appears well-developed and well-nourished.  HENT:  Head: Normocephalic.  Eyes: EOM are normal.  Neck: Normal range of motion.  Pulmonary/Chest: Effort normal.  Abdominal: He exhibits no distension.  Musculoskeletal: Normal range of motion.  Neurological: He is alert and oriented to  person, place, and time.  Psychiatric: He has a normal mood and affect.    ED Course  Procedures (including critical care time)  DIAGNOSTIC STUDIES: Oxygen Saturation is 98% on RA, Normal by my interpretation.    COORDINATION OF CARE: 10:24 PM-Discussed treatment plan with pt at bedside and pt agreed to plan.     Labs Review Labs Reviewed - No data to display  Imaging Review No results found.   EKG Interpretation None      MDM   Final diagnoses:  Tinea pedis of left foot    Lotrimin   I personally performed the services described in this documentation, which was scribed in my presence. The recorded information has been reviewed and is accurate.    [place], PA-C 07/27/14 2359

## 2014-07-27 NOTE — Discharge Instructions (Signed)
Athlete's Foot Athlete's foot (tinea pedis) is a fungal infection of the skin on the feet. It often occurs on the skin between the toes or underneath the toes. It can also occur on the soles of the feet. Athlete's foot is more likely to occur in hot, humid weather. Not washing your feet or changing your socks often enough can contribute to athlete's foot. The infection can spread from person to person (contagious). CAUSES Athlete's foot is caused by a fungus. This fungus thrives in warm, moist places. Most people get athlete's foot by sharing shower stalls, towels, and wet floors with an infected person. People with weakened immune systems, including those with diabetes, may be more likely to get athlete's foot. SYMPTOMS   Itchy areas between the toes or on the soles of the feet.  White, flaky, or scaly areas between the toes or on the soles of the feet.  Tiny, intensely itchy blisters between the toes or on the soles of the feet.  Tiny cuts on the skin. These cuts can develop a bacterial infection.  Thick or discolored toenails. DIAGNOSIS  Your caregiver can usually tell what the problem is by doing a physical exam. Your caregiver may also take a skin sample from the rash area. The skin sample may be examined under a microscope, or it may be tested to see if fungus will grow in the sample. A sample may also be taken from your toenail for testing. TREATMENT  Over-the-counter and prescription medicines can be used to kill the fungus. These medicines are available as powders or creams. Your caregiver can suggest medicines for you. Fungal infections respond slowly to treatment. You may need to continue using your medicine for several weeks. PREVENTION   Do not share towels.  Wear sandals in wet areas, such as shared locker rooms and shared showers.  Keep your feet dry. Wear shoes that allow air to circulate. Wear cotton or wool socks. HOME CARE INSTRUCTIONS   Take medicines as directed by  your caregiver. Do not use steroid creams on athlete's foot.  Keep your feet clean and cool. Wash your feet daily and dry them thoroughly, especially between your toes.  Change your socks every day. Wear cotton or wool socks. In hot climates, you may need to change your socks 2 to 3 times per day.  Wear sandals or canvas tennis shoes with good air circulation.  If you have blisters, soak your feet in Burow's solution or Epsom salts for 20 to 30 minutes, 2 times a day to dry out the blisters. Make sure you dry your feet thoroughly afterward. SEEK MEDICAL CARE IF:   You have a fever.  You have swelling, soreness, warmth, or redness in your foot.  You are not getting better after 7 days of treatment.  You are not completely cured after 30 days.  You have any problems caused by your medicines. MAKE SURE YOU:   Understand these instructions.  Will watch your condition.  Will get help right away if you are not doing well or get worse. Document Released: 09/20/2000 Document Revised: 12/16/2011 Document Reviewed: 07/12/2011 Exit[employer] Patient Information 2015 [employer]. This information is not intended to replace advice given to you by your health care provider. Make sure you discuss any questions you have with your health care provider.  

## 2014-07-28 NOTE — ED Provider Notes (Signed)
Medical screening examination/treatment/procedure(s) were performed by non-physician practitioner and as supervising physician I was immediately available for consultation/collaboration.   EKG Interpretation None        Blanchie Dessert, MD 07/28/14 2348

## 2014-08-02 ENCOUNTER — Emergency Department (HOSPITAL_COMMUNITY)
Admission: EM | Admit: 2014-08-02 | Discharge: 2014-08-02 | Disposition: A | Discharge status: Home / Self Care | Payer: Medicare Other | Attending: Emergency Medicine | Admitting: Emergency Medicine

## 2014-08-02 ENCOUNTER — Encounter (HOSPITAL_COMMUNITY): Payer: Self-pay | Admitting: Emergency Medicine

## 2014-08-02 DIAGNOSIS — Z79899 Other long term (current) drug therapy: Secondary | ICD-10-CM | POA: Insufficient documentation

## 2014-08-02 DIAGNOSIS — Z955 Presence of coronary angioplasty implant and graft: Secondary | ICD-10-CM | POA: Insufficient documentation

## 2014-08-02 DIAGNOSIS — Z7982 Long term (current) use of aspirin: Secondary | ICD-10-CM | POA: Insufficient documentation

## 2014-08-02 DIAGNOSIS — Z872 Personal history of diseases of the skin and subcutaneous tissue: Secondary | ICD-10-CM | POA: Insufficient documentation

## 2014-08-02 DIAGNOSIS — G8929 Other chronic pain: Secondary | ICD-10-CM

## 2014-08-02 DIAGNOSIS — Z794 Long term (current) use of insulin: Secondary | ICD-10-CM | POA: Diagnosis not present

## 2014-08-02 DIAGNOSIS — Z7902 Long term (current) use of antithrombotics/antiplatelets: Secondary | ICD-10-CM | POA: Insufficient documentation

## 2014-08-02 DIAGNOSIS — F329 Major depressive disorder, single episode, unspecified: Secondary | ICD-10-CM | POA: Diagnosis not present

## 2014-08-02 DIAGNOSIS — E119 Type 2 diabetes mellitus without complications: Secondary | ICD-10-CM | POA: Diagnosis not present

## 2014-08-02 DIAGNOSIS — J45909 Unspecified asthma, uncomplicated: Secondary | ICD-10-CM | POA: Diagnosis not present

## 2014-08-02 DIAGNOSIS — M109 Gout, unspecified: Secondary | ICD-10-CM | POA: Insufficient documentation

## 2014-08-02 DIAGNOSIS — M549 Dorsalgia, unspecified: Secondary | ICD-10-CM | POA: Diagnosis present

## 2014-08-02 DIAGNOSIS — E785 Hyperlipidemia, unspecified: Secondary | ICD-10-CM | POA: Insufficient documentation

## 2014-08-02 DIAGNOSIS — I10 Essential (primary) hypertension: Secondary | ICD-10-CM | POA: Insufficient documentation

## 2014-08-02 DIAGNOSIS — I2510 Atherosclerotic heart disease of native coronary artery without angina pectoris: Secondary | ICD-10-CM | POA: Diagnosis not present

## 2014-08-02 MED ORDER — OXYCODONE-ACETAMINOPHEN 5-325 MG PO TABS
1.0000 | ORAL_TABLET | Freq: Once | ORAL | Status: AC
  Administered 2014-08-02: 1 via ORAL
  Filled 2014-08-02: qty 1

## 2014-08-02 MED ORDER — OXYCODONE-ACETAMINOPHEN 5-325 MG PO TABS: 1.0000 | ORAL_TABLET | Freq: Four times a day (QID) | ORAL | Status: DC | PRN
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
## 2014-08-02 NOTE — ED Notes (Signed)
Pt c/o back pain for a month and a half. Pt is not having any relief with current pain medication. Ambulatory with steady gait to triage room.

## 2014-08-02 NOTE — Discharge Instructions (Signed)

## 2014-08-04 ENCOUNTER — Encounter (HOSPITAL_COMMUNITY): Payer: Self-pay | Admitting: Emergency Medicine

## 2014-08-04 ENCOUNTER — Emergency Department (HOSPITAL_COMMUNITY)
Admission: EM | Admit: 2014-08-04 | Discharge: 2014-08-04 | Disposition: A | Discharge status: Home / Self Care | Payer: Medicare Other | Attending: Emergency Medicine | Admitting: Emergency Medicine

## 2014-08-04 DIAGNOSIS — F329 Major depressive disorder, single episode, unspecified: Secondary | ICD-10-CM | POA: Diagnosis not present

## 2014-08-04 DIAGNOSIS — Z7982 Long term (current) use of aspirin: Secondary | ICD-10-CM | POA: Diagnosis not present

## 2014-08-04 DIAGNOSIS — M109 Gout, unspecified: Secondary | ICD-10-CM | POA: Insufficient documentation

## 2014-08-04 DIAGNOSIS — E785 Hyperlipidemia, unspecified: Secondary | ICD-10-CM | POA: Insufficient documentation

## 2014-08-04 DIAGNOSIS — Z21 Asymptomatic human immunodeficiency virus [HIV] infection status: Secondary | ICD-10-CM | POA: Insufficient documentation

## 2014-08-04 DIAGNOSIS — I2510 Atherosclerotic heart disease of native coronary artery without angina pectoris: Secondary | ICD-10-CM | POA: Insufficient documentation

## 2014-08-04 DIAGNOSIS — Z791 Long term (current) use of non-steroidal anti-inflammatories (NSAID): Secondary | ICD-10-CM | POA: Diagnosis not present

## 2014-08-04 DIAGNOSIS — Z794 Long term (current) use of insulin: Secondary | ICD-10-CM | POA: Insufficient documentation

## 2014-08-04 DIAGNOSIS — E119 Type 2 diabetes mellitus without complications: Secondary | ICD-10-CM | POA: Insufficient documentation

## 2014-08-04 DIAGNOSIS — I10 Essential (primary) hypertension: Secondary | ICD-10-CM | POA: Insufficient documentation

## 2014-08-04 DIAGNOSIS — Z7901 Long term (current) use of anticoagulants: Secondary | ICD-10-CM | POA: Insufficient documentation

## 2014-08-04 DIAGNOSIS — R51 Headache: Secondary | ICD-10-CM | POA: Insufficient documentation

## 2014-08-04 DIAGNOSIS — Z9889 Other specified postprocedural states: Secondary | ICD-10-CM | POA: Insufficient documentation

## 2014-08-04 DIAGNOSIS — J45909 Unspecified asthma, uncomplicated: Secondary | ICD-10-CM | POA: Insufficient documentation

## 2014-08-04 DIAGNOSIS — G8929 Other chronic pain: Secondary | ICD-10-CM | POA: Diagnosis not present

## 2014-08-04 DIAGNOSIS — Z9861 Coronary angioplasty status: Secondary | ICD-10-CM | POA: Insufficient documentation

## 2014-08-04 DIAGNOSIS — Z79899 Other long term (current) drug therapy: Secondary | ICD-10-CM | POA: Insufficient documentation

## 2014-08-04 DIAGNOSIS — Z872 Personal history of diseases of the skin and subcutaneous tissue: Secondary | ICD-10-CM | POA: Diagnosis not present

## 2014-08-04 DIAGNOSIS — M5416 Radiculopathy, lumbar region: Secondary | ICD-10-CM | POA: Diagnosis not present

## 2014-08-04 DIAGNOSIS — M542 Cervicalgia: Secondary | ICD-10-CM | POA: Insufficient documentation

## 2014-08-04 DIAGNOSIS — M549 Dorsalgia, unspecified: Secondary | ICD-10-CM | POA: Diagnosis present

## 2014-08-04 MED ORDER — HYDROMORPHONE HCL 1 MG/ML IJ SOLN
1.0000 mg | Freq: Once | INTRAMUSCULAR | Status: AC
  Administered 2014-08-04: 1 mg via INTRAVENOUS
  Filled 2014-08-04: qty 1

## 2014-08-04 NOTE — ED Provider Notes (Signed)
Medical screening examination/treatment/procedure(s) were performed by non-physician practitioner and as supervising physician I was immediately available for consultation/collaboration.   EKG Interpretation None        Everlene Balls, MD 08/04/14 [phone]

## 2014-08-04 NOTE — ED Provider Notes (Signed)
Medical screening examination/treatment/procedure(s) were performed by non-physician practitioner and as supervising physician I was immediately available for consultation/collaboration.   Dot Lanes, MD 08/04/14 1350

## 2014-08-04 NOTE — Discharge Instructions (Signed)
You can safely take 1 percocet every 4 hours or 2 Percocet's every 6 hours until you are seen November 6,2015

## 2014-08-04 NOTE — ED Notes (Signed)
Pt reports he is still in a lot of pain but feels more relaxed, pt is no longer shaking in pain.

## 2014-08-04 NOTE — ED Notes (Signed)
Pt states that he was here last night with pain also and given a script for pain medicine but it has not helped. States that he is supposed to have surgery.
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
## 2014-08-05 ENCOUNTER — Encounter (HOSPITAL_COMMUNITY): Payer: Self-pay | Admitting: Emergency Medicine

## 2014-08-05 DIAGNOSIS — Z794 Long term (current) use of insulin: Secondary | ICD-10-CM | POA: Diagnosis not present

## 2014-08-05 DIAGNOSIS — Z79899 Other long term (current) drug therapy: Secondary | ICD-10-CM | POA: Insufficient documentation

## 2014-08-05 DIAGNOSIS — J45909 Unspecified asthma, uncomplicated: Secondary | ICD-10-CM | POA: Diagnosis not present

## 2014-08-05 DIAGNOSIS — E785 Hyperlipidemia, unspecified: Secondary | ICD-10-CM | POA: Diagnosis not present

## 2014-08-05 DIAGNOSIS — M542 Cervicalgia: Secondary | ICD-10-CM | POA: Diagnosis not present

## 2014-08-05 DIAGNOSIS — M109 Gout, unspecified: Secondary | ICD-10-CM | POA: Insufficient documentation

## 2014-08-05 DIAGNOSIS — Z21 Asymptomatic human immunodeficiency virus [HIV] infection status: Secondary | ICD-10-CM | POA: Diagnosis not present

## 2014-08-05 DIAGNOSIS — M5416 Radiculopathy, lumbar region: Secondary | ICD-10-CM | POA: Diagnosis not present

## 2014-08-05 DIAGNOSIS — Z7982 Long term (current) use of aspirin: Secondary | ICD-10-CM | POA: Diagnosis not present

## 2014-08-05 DIAGNOSIS — Z7902 Long term (current) use of antithrombotics/antiplatelets: Secondary | ICD-10-CM | POA: Diagnosis not present

## 2014-08-05 DIAGNOSIS — I10 Essential (primary) hypertension: Secondary | ICD-10-CM | POA: Diagnosis not present

## 2014-08-05 DIAGNOSIS — G8929 Other chronic pain: Secondary | ICD-10-CM | POA: Insufficient documentation

## 2014-08-05 DIAGNOSIS — I2510 Atherosclerotic heart disease of native coronary artery without angina pectoris: Secondary | ICD-10-CM | POA: Diagnosis not present

## 2014-08-05 DIAGNOSIS — E119 Type 2 diabetes mellitus without complications: Secondary | ICD-10-CM | POA: Insufficient documentation

## 2014-08-05 DIAGNOSIS — Z872 Personal history of diseases of the skin and subcutaneous tissue: Secondary | ICD-10-CM | POA: Diagnosis not present

## 2014-08-05 DIAGNOSIS — M545 Low back pain: Secondary | ICD-10-CM | POA: Diagnosis present

## 2014-08-05 DIAGNOSIS — Z791 Long term (current) use of non-steroidal anti-inflammatories (NSAID): Secondary | ICD-10-CM | POA: Insufficient documentation

## 2014-08-05 DIAGNOSIS — Z9889 Other specified postprocedural states: Secondary | ICD-10-CM | POA: Diagnosis not present

## 2014-08-05 DIAGNOSIS — F329 Major depressive disorder, single episode, unspecified: Secondary | ICD-10-CM | POA: Diagnosis not present

## 2014-08-05 DIAGNOSIS — Z9861 Coronary angioplasty status: Secondary | ICD-10-CM | POA: Insufficient documentation
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
## 2014-08-06 ENCOUNTER — Emergency Department (HOSPITAL_COMMUNITY)
Admission: EM | Admit: 2014-08-06 | Discharge: 2014-08-06 | Disposition: A | Discharge status: Home / Self Care | Payer: Medicare Other | Attending: Emergency Medicine | Admitting: Emergency Medicine

## 2014-08-06 DIAGNOSIS — G8929 Other chronic pain: Secondary | ICD-10-CM

## 2014-08-06 DIAGNOSIS — M5416 Radiculopathy, lumbar region: Secondary | ICD-10-CM

## 2014-08-06 DIAGNOSIS — M549 Dorsalgia, unspecified: Secondary | ICD-10-CM

## 2014-08-06 DIAGNOSIS — M542 Cervicalgia: Secondary | ICD-10-CM

## 2014-08-06 MED ORDER — METHOCARBAMOL 500 MG PO TABS: 500.0000 mg | ORAL_TABLET | Freq: Four times a day (QID) | ORAL | Status: DC | PRN

## 2014-08-06 MED ORDER — DIAZEPAM 5 MG PO TABS
5.0000 mg | ORAL_TABLET | Freq: Once | ORAL | Status: AC
  Administered 2014-08-06: 5 mg via ORAL
  Filled 2014-08-06: qty 1

## 2014-08-06 MED ORDER — HYDROMORPHONE HCL 1 MG/ML IJ SOLN
2.0000 mg | Freq: Once | INTRAMUSCULAR | Status: AC
  Administered 2014-08-06: 2 mg via INTRAMUSCULAR
  Filled 2014-08-06: qty 2

## 2014-08-06 MED ORDER — OXYCODONE-ACETAMINOPHEN 7.5-325 MG PO TABS: 1.0000 | ORAL_TABLET | ORAL | Status: DC | PRN

## 2014-08-06 NOTE — ED Provider Notes (Signed)
Medical screening examination/treatment/procedure(s) were performed by non-physician practitioner and as supervising physician I was immediately available for consultation/collaboration.   EKG Interpretation None       Kalman Drape, MD 08/06/14 [phone]
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
## 2014-08-06 NOTE — Discharge Instructions (Signed)
Read the information below.  Use the prescribed medication as directed.  Please discuss all new medications with your pharmacist.  Do not take additional tylenol while taking the prescribed pain medication to avoid overdose.  You may return to the Emergency Department at any time for worsening condition or any new symptoms that concern you.   If you develop fevers, loss of control of bowel or bladder, weakness or numbness in your legs, or are unable to walk, return to the ER for a recheck. ° ° °Back Pain, Adult °Back pain is very common. The pain often gets better over time. The cause of back pain is usually not dangerous. Most people can learn to manage their back pain on their own.  °HOME CARE  °· Stay active. Start with short walks on flat ground if you can. Try to walk farther each day. °· Do not sit, drive, or stand in one place for more than 30 minutes. Do not stay in bed. °· Do not avoid exercise or work. Activity can help your back heal faster. °· Be careful when you bend or lift an object. Bend at your knees, keep the object close to you, and do not twist. °· Sleep on a firm mattress. Lie on your side, and bend your knees. If you lie on your back, put a pillow under your knees. °· Only take medicines as told by your doctor. °· Put ice on the injured area. °¨ Put ice in a plastic bag. °¨ Place a towel between your skin and the bag. °¨ Leave the ice on for 15-20 minutes, 03-04 times a day for the first 2 to 3 days. After that, you can switch between ice and heat packs. °· Ask your doctor about back exercises or massage. °· Avoid feeling anxious or stressed. Find good ways to deal with stress, such as exercise. °GET HELP RIGHT AWAY IF:  °· Your pain does not go away with rest or medicine. °· Your pain does not go away in 1 week. °· You have new problems. °· You do not feel well. °· The pain spreads into your legs. °· You cannot control when you poop (bowel movement) or pee (urinate). °· Your arms or legs feel  weak or lose feeling (numbness). °· You feel sick to your stomach (nauseous) or throw up (vomit). °· You have belly (abdominal) pain. °· You feel like you may pass out (faint). °MAKE SURE YOU:  °· Understand these instructions. °· Will watch your condition. °· Will get help right away if you are not doing well or get worse. °Document Released: 03/11/2008 Document Revised: 12/16/2011 Document Reviewed: 01/25/2014 °[employer]® Patient Information ©2015 [employer]. This information is not intended to replace advice given to you by your health care provider. Make sure you discuss any questions you have with your health care provider. ° °

## 2014-08-10 ENCOUNTER — Encounter (HOSPITAL_COMMUNITY): Payer: Self-pay | Admitting: *Deleted

## 2014-08-10 ENCOUNTER — Encounter: Payer: Self-pay | Admitting: Internal Medicine

## 2014-08-10 DIAGNOSIS — Z7982 Long term (current) use of aspirin: Secondary | ICD-10-CM | POA: Diagnosis not present

## 2014-08-10 DIAGNOSIS — F329 Major depressive disorder, single episode, unspecified: Secondary | ICD-10-CM | POA: Insufficient documentation

## 2014-08-10 DIAGNOSIS — Z872 Personal history of diseases of the skin and subcutaneous tissue: Secondary | ICD-10-CM | POA: Insufficient documentation

## 2014-08-10 DIAGNOSIS — Z794 Long term (current) use of insulin: Secondary | ICD-10-CM | POA: Insufficient documentation

## 2014-08-10 DIAGNOSIS — E785 Hyperlipidemia, unspecified: Secondary | ICD-10-CM | POA: Diagnosis not present

## 2014-08-10 DIAGNOSIS — S3992XA Unspecified injury of lower back, initial encounter: Secondary | ICD-10-CM | POA: Insufficient documentation

## 2014-08-10 DIAGNOSIS — E119 Type 2 diabetes mellitus without complications: Secondary | ICD-10-CM | POA: Insufficient documentation

## 2014-08-10 DIAGNOSIS — I10 Essential (primary) hypertension: Secondary | ICD-10-CM | POA: Diagnosis not present

## 2014-08-10 DIAGNOSIS — M109 Gout, unspecified: Secondary | ICD-10-CM | POA: Diagnosis not present

## 2014-08-10 DIAGNOSIS — Y9389 Activity, other specified: Secondary | ICD-10-CM | POA: Diagnosis not present

## 2014-08-10 DIAGNOSIS — Z7902 Long term (current) use of antithrombotics/antiplatelets: Secondary | ICD-10-CM | POA: Diagnosis not present

## 2014-08-10 DIAGNOSIS — S0990XA Unspecified injury of head, initial encounter: Secondary | ICD-10-CM | POA: Insufficient documentation

## 2014-08-10 DIAGNOSIS — S199XXA Unspecified injury of neck, initial encounter: Secondary | ICD-10-CM | POA: Diagnosis present

## 2014-08-10 DIAGNOSIS — Z79899 Other long term (current) drug therapy: Secondary | ICD-10-CM | POA: Diagnosis not present

## 2014-08-10 DIAGNOSIS — W1839XA Other fall on same level, initial encounter: Secondary | ICD-10-CM | POA: Diagnosis not present

## 2014-08-10 DIAGNOSIS — Z21 Asymptomatic human immunodeficiency virus [HIV] infection status: Secondary | ICD-10-CM | POA: Diagnosis not present

## 2014-08-10 DIAGNOSIS — J45909 Unspecified asthma, uncomplicated: Secondary | ICD-10-CM | POA: Insufficient documentation

## 2014-08-10 DIAGNOSIS — Y9289 Other specified places as the place of occurrence of the external cause: Secondary | ICD-10-CM | POA: Insufficient documentation

## 2014-08-10 DIAGNOSIS — Z9181 History of falling: Secondary | ICD-10-CM | POA: Diagnosis not present

## 2014-08-10 DIAGNOSIS — I2510 Atherosclerotic heart disease of native coronary artery without angina pectoris: Secondary | ICD-10-CM | POA: Diagnosis not present

## 2014-08-10 NOTE — ED Notes (Signed)
The pt has been falling for the poast 3 days.  He reports that he is supposed to have surgery on his back.  He has fluid leaking in his spine .  He is c/o weakness also.  He has paihn in his knees and his head from the fall

## 2014-08-11 ENCOUNTER — Emergency Department (HOSPITAL_COMMUNITY)
Admission: EM | Admit: 2014-08-11 | Discharge: 2014-08-11 | Disposition: A | Discharge status: Home / Self Care | Payer: Medicare Other | Attending: Emergency Medicine | Admitting: Emergency Medicine

## 2014-08-11 DIAGNOSIS — W19XXXA Unspecified fall, initial encounter: Secondary | ICD-10-CM

## 2014-08-11 DIAGNOSIS — M47812 Spondylosis without myelopathy or radiculopathy, cervical region: Secondary | ICD-10-CM

## 2014-08-11 DIAGNOSIS — M47817 Spondylosis without myelopathy or radiculopathy, lumbosacral region: Secondary | ICD-10-CM

## 2014-08-11 NOTE — ED Provider Notes (Signed)
CSN: [id number]     Arrival date & time 08/10/14  2112 History   First MD Initiated Contact with Patient 08/11/14 0008     Chief Complaint  Patient presents with  . Fall     (Consider location/radiation/quality/duration/timing/severity/associated sxs/prior Treatment) HPI Comments: Pt comes in with cc of fall.  Pt has had known cervical and lumbar spine disease, seeing Neurosurgery. Reports that for the past 3 days, he has had a fall everyday, as his legs just give out. There is no warning for him before the legs give out, resulting in falls. He has constant baseline pain in both area. Pt's back pain radiates to bilateral lower extremity, L more than R, and he has some mild numbness in his feet, which is unchanged. He thinks the weakness is worse on the L side. Pt has no associated NEW numbness, weakness, urinary incontinence, urinary retention, bowel incontinence, saddle anesthesia. Pt has mild headache from the fall. By the time pt was seen by me, he had already waited 4 hours - No nausea, vomiting, visual complains, seizures, altered mental status, loss of consciousness, new weakness, or numbness, no gait instability.     Patient is a 63 y.o. male presenting with fall. The history is provided by the patient and medical records.  Fall Associated symptoms include headaches. Pertinent negatives include no chest pain, no abdominal pain and no shortness of breath.    Past Medical History  Diagnosis Date  . Diabetes mellitus     Diagnosed in 2002, started insulin in 2012  . Hypertension   . Headache(784.0)     CT head 08/2011: Periventricular and subcortical white matter hypodensities are most in keeping with chronic microangiopathic change  . Gout   . Hyperlipidemia   . HIV infection Nov 2012    Followed by [name]  . Crack cocaine use     for 20+ years, has been enrolled in detox programs in the past  . Chondromalacia of medial femoral condyle     Left knee MRI 04/28/12:  Chondromalacia of the medial femoral condyle with slight peripheral degeneration of the meniscocapsular junction of the medial meniscus; followed by sports medicine  . Asthma     No PFTs, history of childhood asthma  . Depression     with history of hospitalization for suicidal ideation  . CAD (coronary artery disease)     a. 06/2013 STEMI/PCI ([facility]): LAD w/ thrombus (treated with BMS), mid 75%, D2 75%; LCX OM2 75%; RCA small, PDA 95%, PLV 95%;  b. 10/2013 Cath/PCI: ISR w/in LAD (Promus DES x 2), borderline OM2 lesion;  c. 01/2014 MV: Intermediate risk, medium-sized distal ant wall infarct w/ very small amt of peri-infarct ischemia. EF 60%.  . Gout 04/28/2012  . Collagen vascular disease   . Cellulitis 04/2014    left facial   Past Surgical History  Procedure Laterality Date  . Prostate surgery    . Bowel resection    . Back surgery      1988  . Cardiac surgery    . Cervical spine surgery      " rods in my neck "  . Coronary stent placement    . Nm myocar perf wall motion  12/27/2011    normal   Family History  Problem Relation Age of Onset  . Diabetes Mother   . Diabetes Father   . Diabetes Brother   . Colon cancer Neg Hx    History  Substance Use Topics  . Smoking  status: Never Smoker   . Smokeless tobacco: Never Used  . Alcohol Use: No    Review of Systems  Constitutional: Negative for activity change and appetite change.  Respiratory: Negative for cough and shortness of breath.   Cardiovascular: Negative for chest pain.  Gastrointestinal: Negative for abdominal pain.  Genitourinary: Negative for dysuria.  Musculoskeletal: Positive for back pain and neck pain.  Neurological: Positive for headaches.      Allergies  Review of patient's allergies indicates no known allergies.  Home Medications   Prior to Admission medications   Medication Sig Start Date End Date Taking? Authorizing Provider  allopurinol (ZYLOPRIM) 100 MG tablet Take 200 mg by mouth daily.   Yes  Historical Provider, MD  aspirin EC 81 MG tablet Take 81 mg by mouth daily.   Yes Historical Provider, MD  clopidogrel (PLAVIX) 75 MG tablet Take 75 mg by mouth daily.   Yes Historical Provider, MD  diclofenac (VOLTAREN) 25 MG EC tablet Take 1 tablet (25 mg total) by mouth 3 (three) times daily as needed. 06/28/14  Yes Alexa Marvel Plan, MD  diclofenac sodium (VOLTAREN) 1 % GEL Apply 2 g topically daily as needed (pain).   Yes Historical Provider, MD  diltiazem (DILACOR XR) 120 MG 24 hr capsule Take 120 mg by mouth daily.   Yes Historical Provider, MD  DULoxetine (CYMBALTA) 60 MG capsule Take 60 mg by mouth daily.   Yes Historical Provider, MD  Emtricitab-Rilpivir-Tenofovir (COMPLERA) 200-25-300 MG TABS Take 1 tablet by mouth daily.   Yes Historical Provider, MD  famotidine (PEPCID) 20 MG tablet Take 1 tablet (20 mg total) by mouth daily at 8 pm. 06/28/14  Yes Alexa Marvel Plan, MD  gabapentin (NEURONTIN) 300 MG capsule Take 300 mg by mouth 3 (three) times daily.   Yes Historical Provider, MD  insulin aspart (NOVOLOG) 100 UNIT/ML injection Inject 4 Units into the skin 3 (three) times daily before meals.    Yes Historical Provider, MD  Insulin Glargine (LANTUS) 100 UNIT/ML Solostar Pen Inject 30 Units into the skin at bedtime.   Yes Historical Provider, MD  ipratropium-albuterol (DUONEB) 0.5-2.5 (3) MG/3ML SOLN Take 3 mLs by nebulization every 4 (four) hours as needed (shortness of breath).   Yes Historical Provider, MD  isosorbide mononitrate (IMDUR) 30 MG 24 hr tablet Take 1 tablet (30 mg total) by mouth daily. 07/13/14  Yes Mihai Croitoru, MD  lisinopril (PRINIVIL,ZESTRIL) 10 MG tablet Take 5 mg by mouth daily.    Yes Historical Provider, MD  metFORMIN (GLUCOPHAGE) 1000 MG tablet Take 1,000 mg by mouth 2 (two) times daily with a meal.   Yes Historical Provider, MD  methocarbamol (ROBAXIN) 500 MG tablet Take 1 tablet (500 mg total) by mouth every 6 (six) hours as needed for muscle spasms (or pain).  08/06/14  Yes Clayton Bibles, PA-C  nitroGLYCERIN (NITROSTAT) 0.4 MG SL tablet Place 0.4 mg under the tongue every 5 (five) minutes as needed for chest pain.   Yes Historical Provider, MD  oxyCODONE-acetaminophen (PERCOCET) 7.5-325 MG per tablet Take 1-2 tablets by mouth every 4 (four) hours as needed for pain. 08/06/14  Yes Clayton Bibles, PA-C  pravastatin (PRAVACHOL) 40 MG tablet Take 40 mg by mouth daily.   Yes Historical Provider, MD  traMADol (ULTRAM) 50 MG tablet Take 50 mg by mouth every 6 (six) hours as needed for moderate pain.   Yes Historical Provider, MD   BP 125/84 mmHg  Pulse 95  Temp(Src) 97.7 F (36.5 C)  Resp 15  SpO2 96% Physical Exam  Constitutional: He is oriented to person, place, and time. He appears well-developed.  HENT:  Head: Normocephalic and atraumatic.  Eyes: Conjunctivae and EOM are normal. Pupils are equal, round, and reactive to light.  Neck: Normal range of motion. Neck supple.  Cardiovascular: Normal rate and regular rhythm.   Pulmonary/Chest: Effort normal and breath sounds normal.  Abdominal: Soft. Bowel sounds are normal. He exhibits no distension. There is no tenderness. There is no rebound and no guarding.  Neurological: He is alert and oriented to person, place, and time. No cranial nerve deficit.  Sensory exam normal for bilateral upper and lower extremities - and patient is able to discriminate between sharp and dull. Motor exam is 4+/5, but left was slightly weaker compared to the Right. 2+ patellar reflex.    Skin: Skin is warm.  Nursing note and vitals reviewed.   ED Course  Procedures (including critical care time) Labs Review Labs Reviewed - No data to display  Imaging Review No results found.   EKG Interpretation None      MRI C-SPINE 06/2014 IMPRESSION: Negative for evidence of trauma.  Severe central canal stenosis at C3-4 where flattening of the cord due to a prominent disc osteophyte complex is unchanged. There is signal  abnormality within the cord at this level which is also stable in appearance. Left foraminal narrowing has progressed since the prior study.  Status post C4-5 fusion. The central canal and foramina are open. No complicating feature.  No change in spondylosis at C6-7 where there is left worse than right foraminal narrowing and mild deformity of the cord due to disc.  MRI L spine-  06/2014 IMPRESSION: Satisfactory appearance at the previous fusion level of L5-S1.  Severe multifactorial stenosis L4-5. Advanced bilateral facet arthropathy with 5 mm of anterolisthesis. Shallow protrusion of disc material. Foraminal stenosis bilaterally. Neural compression could occur at this level.  Lateral recess narrowing at L3-4 due to bulging of the disc in combination with mild facet and ligamentous hypertrophy. This is slightly more pronounced on the left. Neural compression could occur at this level, but the findings are much less severe than the findings at L4-5.   MDM   Final diagnoses:  Fall, initial encounter  DJD (degenerative joint disease) of cervical spine  DJD (degenerative joint disease), lumbosacral    Pt comes in with cc of fall. Mechanical fall, as the legs just gave out.  MRI C and L spine reviewed. Note from Nsurgery reviewed, and it seems like there is a discussion for possible elective surgery. Based on the current exam, i dont think a MRI is justified, especially since the f/u appt is in 1 day.  Pt informed of the plan, and is in agreement. He has a walker at home, asked him to use is daily. Return precautions discussed.   Varney Biles, MD 08/11/14 [phone]

## 2014-08-11 NOTE — ED Notes (Signed)
Pt ambulated in hallway. 12feet. Pt reported slight dizziness throughout ambulation. Pt reported back and leg pain during ambulation. Unsteady limping gait.

## 2014-08-11 NOTE — ED Notes (Signed)
Breakfast tray ordered.  Went into talk with pt. About discharge and he has not received his breakfast.  He is not leaving until he gets his breakfast.  Pt., is getting dressed, going to the bathroom, breakfast is ordered.

## 2014-08-11 NOTE — Discharge Instructions (Signed)
We saw you in the ER after you had a fall. WE were able to walk you, with assistance. It is imperative that you use the walker at home when you walk, and see the Neurosurgeon as planned on Friday. Also, return to the ER if the symptoms are getting worse and you are unable to walk.   Degenerative Disk Disease Degenerative disk disease is a condition caused by the changes that occur in the cushions of the backbone (spinal disks) as you grow older. Spinal disks are soft and compressible disks located between the bones of the spine (vertebrae). They act like shock absorbers. Degenerative disk disease can affect the whole spine. However, the neck and lower back are most commonly affected. Many changes can occur in the spinal disks with aging, such as:  The spinal disks may dry and shrink.  Small tears may occur in the tough, outer covering of the disk (annulus).  The disk space may become smaller due to loss of water.  Abnormal growths in the bone (spurs) may occur. This can put pressure on the nerve roots exiting the spinal canal, causing pain.  The spinal canal may become narrowed. CAUSES  Degenerative disk disease is a condition caused by the changes that occur in the spinal disks with aging. The exact cause is not known, but there is a genetic basis for many patients. Degenerative changes can occur due to loss of fluid in the disk. This makes the disk thinner and reduces the space between the backbones. Small cracks can develop in the outer layer of the disk. This can lead to the breakdown of the disk. You are more likely to get degenerative disk disease if you are overweight. Smoking cigarettes and doing heavy work such as weightlifting can also increase your risk of this condition. Degenerative changes can start after a sudden injury. Growth of bone spurs can compress the nerve roots and cause pain.  SYMPTOMS  The symptoms vary from person to person. Some people may have no pain, while others  have severe pain. The pain may be so severe that it can limit your activities. The location of the pain depends on the part of your backbone that is affected. You will have neck or arm pain if a disk in the neck area is affected. You will have pain in your back, buttocks, or legs if a disk in the lower back is affected. The pain becomes worse while bending, reaching up, or with twisting movements. The pain may start gradually and then get worse as time passes. It may also start after a major or minor injury. You may feel numbness or tingling in the arms or legs.  DIAGNOSIS  Your caregiver will ask you about your symptoms and about activities or habits that may cause the pain. He or she may also ask about any injuries, diseases, or treatments you have had earlier. Your caregiver will examine you to check for the range of movement that is possible in the affected area, to check for strength in your extremities, and to check for sensation in the areas of the arms and legs supplied by different nerve roots. An X-ray of the spine may be taken. Your caregiver may suggest other imaging tests, such as magnetic resonance imaging (MRI), if needed.  TREATMENT  Treatment includes rest, modifying your activities, and applying ice and heat. Your caregiver may prescribe medicines to reduce your pain and may ask you to do some exercises to strengthen your back. In some cases,  you may need surgery. You and your caregiver will decide on the treatment that is best for you. HOME CARE INSTRUCTIONS   Follow proper lifting and walking techniques as advised by your caregiver.  Maintain good posture.  Exercise regularly as advised.  Perform relaxation exercises.  Change your sitting, standing, and sleeping habits as advised. Change positions frequently.  Lose weight as advised.  Stop smoking if you smoke.  Wear supportive footwear. SEEK MEDICAL CARE IF:  Your pain does not go away within 1 to 4 weeks. SEEK IMMEDIATE  MEDICAL CARE IF:   Your pain is severe.  You notice weakness in your arms, hands, or legs.  You begin to lose control of your bladder or bowel movements. MAKE SURE YOU:   Understand these instructions.  Will watch your condition.  Will get help right away if you are not doing well or get worse. Document Released: 07/21/2007 Document Revised: 12/16/2011 Document Reviewed: 01/25/2014 [facility] Patient Information 2015 [place], [place]. This information is not intended to replace advice given to you by your health care provider. Make sure you discuss any questions you have with your health care provider.

## 2014-08-12 ENCOUNTER — Encounter (HOSPITAL_COMMUNITY): Payer: Self-pay | Admitting: Vascular Surgery

## 2014-08-12 ENCOUNTER — Emergency Department (HOSPITAL_COMMUNITY)
Admission: EM | Admit: 2014-08-12 | Discharge: 2014-08-12 | Disposition: A | Discharge status: Home / Self Care | Payer: Medicare Other | Attending: Emergency Medicine | Admitting: Emergency Medicine

## 2014-08-12 DIAGNOSIS — Z794 Long term (current) use of insulin: Secondary | ICD-10-CM | POA: Insufficient documentation

## 2014-08-12 DIAGNOSIS — Z7982 Long term (current) use of aspirin: Secondary | ICD-10-CM | POA: Insufficient documentation

## 2014-08-12 DIAGNOSIS — J45909 Unspecified asthma, uncomplicated: Secondary | ICD-10-CM | POA: Insufficient documentation

## 2014-08-12 DIAGNOSIS — Z21 Asymptomatic human immunodeficiency virus [HIV] infection status: Secondary | ICD-10-CM | POA: Insufficient documentation

## 2014-08-12 DIAGNOSIS — Z872 Personal history of diseases of the skin and subcutaneous tissue: Secondary | ICD-10-CM | POA: Diagnosis not present

## 2014-08-12 DIAGNOSIS — Z79899 Other long term (current) drug therapy: Secondary | ICD-10-CM | POA: Diagnosis not present

## 2014-08-12 DIAGNOSIS — M109 Gout, unspecified: Secondary | ICD-10-CM | POA: Diagnosis not present

## 2014-08-12 DIAGNOSIS — E119 Type 2 diabetes mellitus without complications: Secondary | ICD-10-CM | POA: Diagnosis not present

## 2014-08-12 DIAGNOSIS — E785 Hyperlipidemia, unspecified: Secondary | ICD-10-CM | POA: Insufficient documentation

## 2014-08-12 DIAGNOSIS — M545 Low back pain: Secondary | ICD-10-CM | POA: Diagnosis present

## 2014-08-12 DIAGNOSIS — I2510 Atherosclerotic heart disease of native coronary artery without angina pectoris: Secondary | ICD-10-CM | POA: Insufficient documentation

## 2014-08-12 DIAGNOSIS — I10 Essential (primary) hypertension: Secondary | ICD-10-CM | POA: Diagnosis not present

## 2014-08-12 DIAGNOSIS — G8929 Other chronic pain: Secondary | ICD-10-CM

## 2014-08-12 DIAGNOSIS — Z7902 Long term (current) use of antithrombotics/antiplatelets: Secondary | ICD-10-CM | POA: Diagnosis not present

## 2014-08-12 DIAGNOSIS — F329 Major depressive disorder, single episode, unspecified: Secondary | ICD-10-CM | POA: Diagnosis not present

## 2014-08-12 DIAGNOSIS — M549 Dorsalgia, unspecified: Secondary | ICD-10-CM

## 2014-08-12 MED ORDER — FENTANYL CITRATE 0.05 MG/ML IJ SOLN
100.0000 ug | Freq: Once | INTRAMUSCULAR | Status: AC
  Administered 2014-08-12: 100 ug via INTRAVENOUS
  Filled 2014-08-12: qty 2

## 2014-08-12 MED ORDER — KETOROLAC TROMETHAMINE 30 MG/ML IJ SOLN
15.0000 mg | Freq: Once | INTRAMUSCULAR | Status: AC
  Administered 2014-08-12: 15 mg via INTRAVENOUS
  Filled 2014-08-12: qty 1

## 2014-08-12 MED ORDER — FENTANYL 12 MCG/HR TD PT72
12.5000 ug | MEDICATED_PATCH | TRANSDERMAL | Status: DC
  Administered 2014-08-12: 12.5 ug via TRANSDERMAL
  Filled 2014-08-12: qty 1

## 2014-08-12 MED ORDER — FENTANYL 12 MCG/HR TD PT72: 12.5000 ug | MEDICATED_PATCH | TRANSDERMAL | Status: DC

## 2014-08-12 MED ORDER — IBUPROFEN 600 MG PO TABS: 600.0000 mg | ORAL_TABLET | Freq: Four times a day (QID) | ORAL | Status: DC | PRN

## 2014-08-12 NOTE — ED Notes (Signed)
Per EMS: pt coming from [facility] [place]. Pt c/o lower back pain. Pt was seen here for same early this morning. Pt also c/o shortness of breath, lungs clear, SpO2 WLN. Pt A&Ox4, respirations equal and unlabored, skin warm and dry

## 2014-08-12 NOTE — Discharge Instructions (Signed)
Back Pain, Adult Low back pain is very common. About 1 in 5 people have back pain.The cause of low back pain is rarely dangerous. The pain often gets better over time.About half of people with a sudden onset of back pain feel better in just 2 weeks. About 8 in 10 people feel better by 6 weeks.  CAUSES Some common causes of back pain include:  Strain of the muscles or ligaments supporting the spine.  Wear and tear (degeneration) of the spinal discs.  Arthritis.  Direct injury to the back. DIAGNOSIS Most of the time, the direct cause of low back pain is not known.However, back pain can be treated effectively even when the exact cause of the pain is unknown.Answering your caregiver's questions about your overall health and symptoms is one of the most accurate ways to make sure the cause of your pain is not dangerous. If your caregiver needs more information, he or she may order lab work or imaging tests (X-rays or MRIs).However, even if imaging tests show changes in your back, this usually does not require surgery. HOME CARE INSTRUCTIONS For many people, back pain returns.Since low back pain is rarely dangerous, it is often a condition that people can learn to manageon their own.   Remain active. It is stressful on the back to sit or stand in one place. Do not sit, drive, or stand in one place for more than 30 minutes at a time. Take short walks on level surfaces as soon as pain allows.Try to increase the length of time you walk each day.  Do not stay in bed.Resting more than 1 or 2 days can delay your recovery.  Do not avoid exercise or work.Your body is made to move.It is not dangerous to be active, even though your back may hurt.Your back will likely heal faster if you return to being active before your pain is gone.  Pay attention to your body when you bend and lift. Many people have less discomfortwhen lifting if they bend their knees, keep the load close to their bodies,and  avoid twisting. Often, the most comfortable positions are those that put less stress on your recovering back.  Find a comfortable position to sleep. Use a firm mattress and lie on your side with your knees slightly bent. If you lie on your back, put a pillow under your knees.  Only take over-the-counter or prescription medicines as directed by your caregiver. Over-the-counter medicines to reduce pain and inflammation are often the most helpful.Your caregiver may prescribe muscle relaxant drugs.These medicines help dull your pain so you can more quickly return to your normal activities and healthy exercise.  Put ice on the injured area.  Put ice in a plastic bag.  Place a towel between your skin and the bag.  Leave the ice on for 15-20 minutes, 03-04 times a day for the first 2 to 3 days. After that, ice and heat may be alternated to reduce pain and spasms.  Ask your caregiver about trying back exercises and gentle massage. This may be of some benefit.  Avoid feeling anxious or stressed.Stress increases muscle tension and can worsen back pain.It is important to recognize when you are anxious or stressed and learn ways to manage it.Exercise is a great option. SEEK MEDICAL CARE IF:  You have pain that is not relieved with rest or medicine.  You have pain that does not improve in 1 week.  You have new symptoms.  You are generally not feeling well. SEEK   IMMEDIATE MEDICAL CARE IF:   You have pain that radiates from your back into your legs.  You develop new bowel or bladder control problems.  You have unusual weakness or numbness in your arms or legs.  You develop nausea or vomiting.  You develop abdominal pain.  You feel faint. Document Released: 09/23/2005 Document Revised: 03/24/2012 Document Reviewed: 01/25/2014 [employer] Patient Information 2015 [employer]. This information is not intended to replace advice given to you by your health care provider. Make sure you  discuss any questions you have with your health care provider.  

## 2014-08-13 ENCOUNTER — Encounter (HOSPITAL_COMMUNITY): Payer: Self-pay | Admitting: Oncology

## 2014-08-13 ENCOUNTER — Emergency Department (HOSPITAL_COMMUNITY)
Admission: EM | Admit: 2014-08-13 | Discharge: 2014-08-18 | Payer: Medicare Other | Attending: Emergency Medicine | Admitting: Emergency Medicine

## 2014-08-13 DIAGNOSIS — Z8659 Personal history of other mental and behavioral disorders: Secondary | ICD-10-CM | POA: Insufficient documentation

## 2014-08-13 DIAGNOSIS — F323 Major depressive disorder, single episode, severe with psychotic features: Secondary | ICD-10-CM | POA: Diagnosis present

## 2014-08-13 DIAGNOSIS — F329 Major depressive disorder, single episode, unspecified: Secondary | ICD-10-CM | POA: Diagnosis present

## 2014-08-13 DIAGNOSIS — Z21 Asymptomatic human immunodeficiency virus [HIV] infection status: Secondary | ICD-10-CM | POA: Insufficient documentation

## 2014-08-13 DIAGNOSIS — G8929 Other chronic pain: Secondary | ICD-10-CM | POA: Insufficient documentation

## 2014-08-13 DIAGNOSIS — Z7902 Long term (current) use of antithrombotics/antiplatelets: Secondary | ICD-10-CM | POA: Insufficient documentation

## 2014-08-13 DIAGNOSIS — Z9889 Other specified postprocedural states: Secondary | ICD-10-CM | POA: Diagnosis not present

## 2014-08-13 DIAGNOSIS — Z7982 Long term (current) use of aspirin: Secondary | ICD-10-CM | POA: Diagnosis not present

## 2014-08-13 DIAGNOSIS — Z791 Long term (current) use of non-steroidal anti-inflammatories (NSAID): Secondary | ICD-10-CM | POA: Insufficient documentation

## 2014-08-13 DIAGNOSIS — R51 Headache: Secondary | ICD-10-CM | POA: Insufficient documentation

## 2014-08-13 DIAGNOSIS — E119 Type 2 diabetes mellitus without complications: Secondary | ICD-10-CM | POA: Diagnosis not present

## 2014-08-13 DIAGNOSIS — R443 Hallucinations, unspecified: Secondary | ICD-10-CM | POA: Insufficient documentation

## 2014-08-13 DIAGNOSIS — M542 Cervicalgia: Secondary | ICD-10-CM | POA: Diagnosis not present

## 2014-08-13 DIAGNOSIS — I10 Essential (primary) hypertension: Secondary | ICD-10-CM | POA: Diagnosis not present

## 2014-08-13 DIAGNOSIS — Z79899 Other long term (current) drug therapy: Secondary | ICD-10-CM | POA: Insufficient documentation

## 2014-08-13 DIAGNOSIS — Z794 Long term (current) use of insulin: Secondary | ICD-10-CM | POA: Insufficient documentation

## 2014-08-13 DIAGNOSIS — M109 Gout, unspecified: Secondary | ICD-10-CM | POA: Diagnosis not present

## 2014-08-13 DIAGNOSIS — F333 Major depressive disorder, recurrent, severe with psychotic symptoms: Secondary | ICD-10-CM

## 2014-08-13 DIAGNOSIS — R45851 Suicidal ideations: Secondary | ICD-10-CM | POA: Insufficient documentation

## 2014-08-13 DIAGNOSIS — M549 Dorsalgia, unspecified: Secondary | ICD-10-CM | POA: Insufficient documentation

## 2014-08-13 DIAGNOSIS — E785 Hyperlipidemia, unspecified: Secondary | ICD-10-CM | POA: Insufficient documentation

## 2014-08-13 DIAGNOSIS — J45909 Unspecified asthma, uncomplicated: Secondary | ICD-10-CM | POA: Diagnosis not present

## 2014-08-13 DIAGNOSIS — Z872 Personal history of diseases of the skin and subcutaneous tissue: Secondary | ICD-10-CM | POA: Diagnosis not present

## 2014-08-13 DIAGNOSIS — I2510 Atherosclerotic heart disease of native coronary artery without angina pectoris: Secondary | ICD-10-CM | POA: Insufficient documentation

## 2014-08-13 DIAGNOSIS — F1410 Cocaine abuse, uncomplicated: Secondary | ICD-10-CM | POA: Diagnosis present

## 2014-08-13 LAB — COMPREHENSIVE METABOLIC PANEL
ALT: 13 U/L (ref 0–53)
AST: 14 U/L (ref 0–37)
Albumin: 3.5 g/dL (ref 3.5–5.2)
Alkaline Phosphatase: 86 U/L (ref 39–117)
Anion gap: 11 (ref 5–15)
BUN: 19 mg/dL (ref 6–23)
CO2: 27 mEq/L (ref 19–32)
Calcium: 9.6 mg/dL (ref 8.4–10.5)
Chloride: 99 mEq/L (ref 96–112)
Creatinine, Ser: 1.57 mg/dL — ABNORMAL HIGH (ref 0.50–1.35)
GFR calc Af Amer: 52 mL/min — ABNORMAL LOW (ref >90)
GFR calc non Af Amer: 45 mL/min — ABNORMAL LOW (ref >90)
Glucose, Bld: 333 mg/dL — ABNORMAL HIGH (ref 70–99)
Potassium: 3.8 mEq/L (ref 3.7–5.3)
Sodium: 137 mEq/L (ref 137–147)
Total Bilirubin: <0.2 mg/dL — ABNORMAL LOW (ref 0.3–1.2)
Total Protein: 7.7 g/dL (ref 6.0–8.3)

## 2014-08-13 LAB — RAPID URINE DRUG SCREEN, HOSP PERFORMED
Amphetamines: NOT DETECTED
Barbiturates: NOT DETECTED
Benzodiazepines: NOT DETECTED
Cocaine: POSITIVE — AB
Opiates: NOT DETECTED
Tetrahydrocannabinol: NOT DETECTED

## 2014-08-13 LAB — CBC WITH DIFFERENTIAL/PLATELET
Basophils Absolute: 0 10*3/uL (ref 0.0–0.1)
Basophils Relative: 0 % (ref 0–1)
Eosinophils Absolute: 0.1 10*3/uL (ref 0.0–0.7)
Eosinophils Relative: 3 % (ref 0–5)
HCT: 34.2 % — ABNORMAL LOW (ref 39.0–52.0)
Hemoglobin: 11.2 g/dL — ABNORMAL LOW (ref 13.0–17.0)
Lymphocytes Relative: 31 % (ref 12–46)
Lymphs Abs: 1.1 10*3/uL (ref 0.7–4.0)
MCH: 26.1 pg (ref 26.0–34.0)
MCHC: 32.7 g/dL (ref 30.0–36.0)
MCV: 79.7 fL (ref 78.0–100.0)
Monocytes Absolute: 0.2 10*3/uL (ref 0.1–1.0)
Monocytes Relative: 6 % (ref 3–12)
Neutro Abs: 2.1 10*3/uL (ref 1.7–7.7)
Neutrophils Relative %: 60 % (ref 43–77)
Platelets: 186 10*3/uL (ref 150–400)
RBC: 4.29 MIL/uL (ref 4.22–5.81)
RDW: 14.6 % (ref 11.5–15.5)
WBC: 3.4 10*3/uL — ABNORMAL LOW (ref 4.0–10.5)

## 2014-08-13 LAB — URINE MICROSCOPIC-ADD ON

## 2014-08-13 LAB — URINALYSIS, ROUTINE W REFLEX MICROSCOPIC
Bilirubin Urine: NEGATIVE
Glucose, UA: >1000 mg/dL — AB
Ketones, ur: NEGATIVE mg/dL
Leukocytes, UA: NEGATIVE
Nitrite: NEGATIVE
Protein, ur: NEGATIVE mg/dL
Specific Gravity, Urine: 1.034 — ABNORMAL HIGH (ref 1.005–1.030)
Urobilinogen, UA: 1 mg/dL (ref 0.0–1.0)
pH: 6 (ref 5.0–8.0)

## 2014-08-13 LAB — ETHANOL: Alcohol, Ethyl (B): <11 mg/dL (ref 0–11)

## 2014-08-13 MED ORDER — NICOTINE 21 MG/24HR TD PT24
21.0000 mg | MEDICATED_PATCH | Freq: Every day | TRANSDERMAL | Status: DC
  Administered 2014-08-14: 21 mg via TRANSDERMAL
  Filled 2014-08-13 (×2): qty 1

## 2014-08-13 MED ORDER — ACETAMINOPHEN 325 MG PO TABS
650.0000 mg | ORAL_TABLET | ORAL | Status: DC | PRN
  Administered 2014-08-16 – 2014-08-17 (×2): 650 mg via ORAL
  Filled 2014-08-13 (×2): qty 2

## 2014-08-13 MED ORDER — LORAZEPAM 1 MG PO TABS
1.0000 mg | ORAL_TABLET | Freq: Three times a day (TID) | ORAL | Status: DC | PRN
  Administered 2014-08-17: 1 mg via ORAL
  Filled 2014-08-13: qty 1

## 2014-08-13 MED ORDER — SODIUM CHLORIDE 0.9 % IV BOLUS (SEPSIS)
1000.0000 mL | Freq: Once | INTRAVENOUS | Status: AC
  Administered 2014-08-13: 1000 mL via INTRAVENOUS

## 2014-08-13 MED ORDER — ONDANSETRON HCL 4 MG PO TABS: 4.0000 mg | ORAL_TABLET | Freq: Three times a day (TID) | ORAL | Status: DC | PRN

## 2014-08-13 MED ORDER — ALUM & MAG HYDROXIDE-SIMETH 200-200-20 MG/5ML PO SUSP: 30.0000 mL | ORAL | Status: DC | PRN

## 2014-08-13 MED ORDER — ZOLPIDEM TARTRATE 5 MG PO TABS
5.0000 mg | ORAL_TABLET | Freq: Every evening | ORAL | Status: DC | PRN
  Administered 2014-08-17: 5 mg via ORAL
  Filled 2014-08-13: qty 1

## 2014-08-13 MED ORDER — IBUPROFEN 200 MG PO TABS
600.0000 mg | ORAL_TABLET | Freq: Three times a day (TID) | ORAL | Status: DC | PRN
  Administered 2014-08-15 – 2014-08-18 (×2): 600 mg via ORAL
  Filled 2014-08-13 (×2): qty 3

## 2014-08-13 NOTE — ED Notes (Signed)
Pt  2 belonging bags behind nurses station in front of room 16.  Pt wanded by securtiy prior to being moved to room 19.

## 2014-08-13 NOTE — ED Notes (Signed)
Pt reports currently hearing voices in his head that are prompting him to kill himself.  Pt reports taking 30, 7.5 mg percocet in a week in a suicide attempt.
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
## 2014-08-13 NOTE — BH Assessment (Signed)
Assessment completed. Consulted Darlyne Russian, PA-C who recommended inpatient treatment. Noland Fordyce, PA-C has been informed of the recommendation.

## 2014-08-14 DIAGNOSIS — F329 Major depressive disorder, single episode, unspecified: Secondary | ICD-10-CM

## 2014-08-14 LAB — BASIC METABOLIC PANEL
Anion gap: 13 (ref 5–15)
BUN: 19 mg/dL (ref 6–23)
CHLORIDE: 103 meq/L (ref 96–112)
CO2: 22 mEq/L (ref 19–32)
Calcium: 9.1 mg/dL (ref 8.4–10.5)
Creatinine, Ser: 1.28 mg/dL (ref 0.50–1.35)
GFR calc Af Amer: 67 mL/min — ABNORMAL LOW (ref >90)
GFR calc non Af Amer: 58 mL/min — ABNORMAL LOW (ref >90)
GLUCOSE: 413 mg/dL — AB (ref 70–99)
POTASSIUM: 4 meq/L (ref 3.7–5.3)
Sodium: 138 mEq/L (ref 137–147)

## 2014-08-14 LAB — CBG MONITORING, ED
GLUCOSE-CAPILLARY: 346 mg/dL — AB (ref 70–99)
GLUCOSE-CAPILLARY: 356 mg/dL — AB (ref 70–99)

## 2014-08-14 LAB — ACETAMINOPHEN LEVEL

## 2014-08-14 LAB — SALICYLATE LEVEL

## 2014-08-14 MED ORDER — CLOPIDOGREL BISULFATE 75 MG PO TABS
75.0000 mg | ORAL_TABLET | Freq: Every day | ORAL | Status: DC
  Administered 2014-08-14 – 2014-08-18 (×5): 75 mg via ORAL
  Filled 2014-08-14 (×5): qty 1

## 2014-08-14 MED ORDER — PRAVASTATIN SODIUM 40 MG PO TABS
40.0000 mg | ORAL_TABLET | Freq: Every day | ORAL | Status: DC
  Administered 2014-08-14 – 2014-08-18 (×5): 40 mg via ORAL
  Filled 2014-08-14 (×5): qty 1

## 2014-08-14 MED ORDER — INSULIN ASPART 100 UNIT/ML ~~LOC~~ SOLN: 4.0000 [IU] | Freq: Three times a day (TID) | SUBCUTANEOUS | Status: DC

## 2014-08-14 MED ORDER — ASPIRIN EC 81 MG PO TBEC
81.0000 mg | DELAYED_RELEASE_TABLET | Freq: Every day | ORAL | Status: DC
  Administered 2014-08-14 – 2014-08-18 (×5): 81 mg via ORAL
  Filled 2014-08-14 (×5): qty 1

## 2014-08-14 MED ORDER — GABAPENTIN 300 MG PO CAPS
300.0000 mg | ORAL_CAPSULE | Freq: Three times a day (TID) | ORAL | Status: DC
  Administered 2014-08-14 – 2014-08-18 (×13): 300 mg via ORAL
  Filled 2014-08-14 (×14): qty 1

## 2014-08-14 MED ORDER — ALLOPURINOL 100 MG PO TABS
200.0000 mg | ORAL_TABLET | Freq: Every day | ORAL | Status: DC
  Administered 2014-08-14 – 2014-08-18 (×5): 200 mg via ORAL
  Filled 2014-08-14 (×5): qty 2

## 2014-08-14 MED ORDER — FAMOTIDINE 20 MG PO TABS: 20.0000 mg | ORAL_TABLET | Freq: Every day | ORAL | Status: DC | PRN

## 2014-08-14 MED ORDER — EMTRICITAB-RILPIVIR-TENOFOV DF 200-25-300 MG PO TABS
1.0000 | ORAL_TABLET | Freq: Every day | ORAL | Status: DC
  Administered 2014-08-14 – 2014-08-18 (×5): 1 via ORAL
  Filled 2014-08-14 (×5): qty 1

## 2014-08-14 MED ORDER — INSULIN GLARGINE 100 UNIT/ML ~~LOC~~ SOLN
30.0000 [IU] | Freq: Every day | SUBCUTANEOUS | Status: DC
  Administered 2014-08-14 – 2014-08-17 (×4): 30 [IU] via SUBCUTANEOUS
  Filled 2014-08-14 (×7): qty 0.3

## 2014-08-14 MED ORDER — INSULIN ASPART 100 UNIT/ML ~~LOC~~ SOLN
4.0000 [IU] | Freq: Three times a day (TID) | SUBCUTANEOUS | Status: DC
  Administered 2014-08-14 – 2014-08-18 (×10): 4 [IU] via SUBCUTANEOUS
  Filled 2014-08-14 (×10): qty 1

## 2014-08-14 MED ORDER — DILTIAZEM HCL ER 120 MG PO CP24
120.0000 mg | ORAL_CAPSULE | Freq: Every day | ORAL | Status: DC
  Administered 2014-08-14 – 2014-08-18 (×5): 120 mg via ORAL
  Filled 2014-08-14 (×5): qty 1

## 2014-08-14 MED ORDER — DULOXETINE HCL 60 MG PO CPEP
60.0000 mg | ORAL_CAPSULE | Freq: Every day | ORAL | Status: DC
  Administered 2014-08-14 – 2014-08-18 (×5): 60 mg via ORAL
  Filled 2014-08-14 (×5): qty 1

## 2014-08-14 NOTE — ED Notes (Signed)
Bed: [facility] Expected date:  Expected time:  Means of arrival:  Comments: 
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
## 2014-08-14 NOTE — ED Provider Notes (Signed)
Here awaiting Psych consult, noted to be hyperglycemic. Has not had home insulin. Will repeat labs, order home insulin regimen. BMP ok, no signs of DKA.  Evelina Bucy, MD 08/18/14 [phone]

## 2014-08-14 NOTE — ED Notes (Signed)
Pt resting without s/s of distress, resp even, skin warm and dry, sitter observing

## 2014-08-14 NOTE — ED Notes (Signed)
Noted that acetaminophen and salicylate levels were not performed as part of medical clearance work up. Ward, MD made aware. Orders received.

## 2014-08-14 NOTE — ED Provider Notes (Signed)
CSN: [id number]     Arrival date & time 08/12/14  1957 History   First MD Initiated Contact with Patient 08/12/14 1959     Chief Complaint  Patient presents with  . Back Pain     HPI Per EMS: pt coming from [facility] [place]. Pt c/o lower back pain. Pt was seen here for same early this morning. Pt also c/o shortness of breath, lungs clear, SpO2 WLN. Pt A&Ox4, respirations equal and unlabored, skin warm and dry  Past Medical History  Diagnosis Date  . Diabetes mellitus     Diagnosed in 2002, started insulin in 2012  . Hypertension   . Headache(784.0)     CT head 08/2011: Periventricular and subcortical white matter hypodensities are most in keeping with chronic microangiopathic change  . Gout   . Hyperlipidemia   . HIV infection Nov 2012    Followed by [name]  . Crack cocaine use     for 20+ years, has been enrolled in detox programs in the past  . Chondromalacia of medial femoral condyle     Left knee MRI 04/28/12: Chondromalacia of the medial femoral condyle with slight peripheral degeneration of the meniscocapsular junction of the medial meniscus; followed by sports medicine  . Asthma     No PFTs, history of childhood asthma  . Depression     with history of hospitalization for suicidal ideation  . CAD (coronary artery disease)     a. 06/2013 STEMI/PCI ([facility]): LAD w/ thrombus (treated with BMS), mid 75%, D2 75%; LCX OM2 75%; RCA small, PDA 95%, PLV 95%;  b. 10/2013 Cath/PCI: ISR w/in LAD (Promus DES x 2), borderline OM2 lesion;  c. 01/2014 MV: Intermediate risk, medium-sized distal ant wall infarct w/ very small amt of peri-infarct ischemia. EF 60%.  . Gout 04/28/2012  . Collagen vascular disease   . Cellulitis 04/2014    left facial   Past Surgical History  Procedure Laterality Date  . Prostate surgery    . Bowel resection    . Back surgery      1988  . Cardiac surgery    . Cervical spine surgery      " rods in my neck "  . Coronary stent placement    . Nm myocar perf  wall motion  12/27/2011    normal   Family History  Problem Relation Age of Onset  . Diabetes Mother   . Diabetes Father   . Diabetes Brother   . Colon cancer Neg Hx    History  Substance Use Topics  . Smoking status: Never Smoker   . Smokeless tobacco: Never Used  . Alcohol Use: No    Review of Systems  Constitutional: Negative for fever, chills, appetite change and unexpected weight change.  Respiratory: Negative for chest tightness.   Neurological: Negative for syncope, speech difficulty and numbness.  Psychiatric/Behavioral: Negative for suicidal ideas, hallucinations, behavioral problems and agitation.  All other systems reviewed and are negative.     Allergies  Review of patient's allergies indicates no known allergies.  Home Medications   Prior to Admission medications   Medication Sig Start Date End Date Taking? Authorizing Provider  allopurinol (ZYLOPRIM) 100 MG tablet Take 200 mg by mouth daily.   Yes Historical Provider, MD  aspirin EC 81 MG tablet Take 81 mg by mouth daily.   Yes Historical Provider, MD  clopidogrel (PLAVIX) 75 MG tablet Take 75 mg by mouth daily.   Yes Historical Provider, MD  diclofenac (VOLTAREN) 25 MG EC tablet Take 1 tablet (25 mg total) by mouth 3 (three) times daily as needed. Patient taking differently: Take 25 mg by mouth 3 (three) times daily as needed for mild pain.  06/28/14  Yes Alexa Marvel Plan, MD  diclofenac sodium (VOLTAREN) 1 % GEL Apply 2 g topically daily as needed (pain).   Yes Historical Provider, MD  diltiazem (DILACOR XR) 120 MG 24 hr capsule Take 120 mg by mouth daily.   Yes Historical Provider, MD  DULoxetine (CYMBALTA) 60 MG capsule Take 60 mg by mouth daily.   Yes Historical Provider, MD  Emtricitab-Rilpivir-Tenofovir (COMPLERA) 200-25-300 MG TABS Take 1 tablet by mouth daily.   Yes Historical Provider, MD  famotidine (PEPCID) 20 MG tablet Take 1 tablet (20 mg total) by mouth daily at 8 pm. Patient taking differently:  Take 20 mg by mouth daily as needed for heartburn.  06/28/14  Yes Alexa Marvel Plan, MD  gabapentin (NEURONTIN) 300 MG capsule Take 300 mg by mouth 3 (three) times daily.   Yes Historical Provider, MD  insulin aspart (NOVOLOG) 100 UNIT/ML injection Inject 4 Units into the skin 3 (three) times daily before meals.    Yes Historical Provider, MD  Insulin Glargine (LANTUS) 100 UNIT/ML Solostar Pen Inject 30 Units into the skin at bedtime.   Yes Historical Provider, MD  ipratropium-albuterol (DUONEB) 0.5-2.5 (3) MG/3ML SOLN Take 3 mLs by nebulization every 4 (four) hours as needed (shortness of breath).   Yes Historical Provider, MD  isosorbide mononitrate (IMDUR) 30 MG 24 hr tablet Take 1 tablet (30 mg total) by mouth daily. 07/13/14  Yes Mihai Croitoru, MD  lisinopril (PRINIVIL,ZESTRIL) 10 MG tablet Take 5 mg by mouth daily.    Yes Historical Provider, MD  metFORMIN (GLUCOPHAGE) 1000 MG tablet Take 1,000 mg by mouth 2 (two) times daily with a meal.   Yes Historical Provider, MD  nitroGLYCERIN (NITROSTAT) 0.4 MG SL tablet Place 0.4 mg under the tongue every 5 (five) minutes as needed for chest pain.   Yes Historical Provider, MD  oxyCODONE-acetaminophen (PERCOCET) 7.5-325 MG per tablet Take 1-2 tablets by mouth every 4 (four) hours as needed for pain. 08/06/14  Yes Clayton Bibles, PA-C  pravastatin (PRAVACHOL) 40 MG tablet Take 40 mg by mouth daily.   Yes Historical Provider, MD  traMADol (ULTRAM) 50 MG tablet Take 50 mg by mouth every 6 (six) hours as needed for moderate pain.   Yes Historical Provider, MD  fentaNYL (DURAGESIC) 12 MCG/HR Place 1 patch (12.5 mcg total) onto the skin every 3 (three) days. 08/12/14   Dot Lanes, MD  ibuprofen (ADVIL,MOTRIN) 600 MG tablet Take 1 tablet (600 mg total) by mouth every 6 (six) hours as needed. 08/12/14   Dot Lanes, MD  methocarbamol (ROBAXIN) 500 MG tablet Take 1 tablet (500 mg total) by mouth every 6 (six) hours as needed for muscle spasms (or pain). 08/06/14    Clayton Bibles, PA-C   BP 131/84 mmHg  Pulse 97  Temp(Src) 98.7 F (37.1 C) (Oral)  Resp 19  SpO2 100% Physical Exam  Constitutional: He is oriented to person, place, and time. He appears well-developed and well-nourished. No distress.  HENT:  Head: Normocephalic and atraumatic.  Eyes: Pupils are equal, round, and reactive to light.  Neck: Normal range of motion.  Cardiovascular: Normal rate and intact distal pulses.   Pulmonary/Chest: No respiratory distress.  Abdominal: Normal appearance. He exhibits no distension.  Musculoskeletal: Normal range of motion.  Neurological: He  is alert and oriented to person, place, and time. He displays normal reflexes. No cranial nerve deficit. He exhibits normal muscle tone. GCS eye subscore is 4. GCS verbal subscore is 5. GCS motor subscore is 6.  Skin: Skin is warm and dry. No rash noted.  Psychiatric: He has a normal mood and affect. His behavior is normal.  Nursing note and vitals reviewed.   ED Course  Procedures (including critical care time) Medications  fentaNYL (SUBLIMAZE) injection 100 mcg (100 mcg Intravenous Given 08/12/14 2054)  ketorolac (TORADOL) 30 MG/ML injection 15 mg (15 mg Intravenous Given 08/12/14 2244)    Labs Review Labs Reviewed - No data to display  Imaging Review No results found.   EKG Interpretation   Date/Time:  Friday August 12 2014 20:06:28 EST Ventricular Rate:  94 PR Interval:    QRS Duration: 79 QT Interval:  350 QTC Calculation: 438 R Axis:   58 Text Interpretation:  Normal sinus rhythm Sinus arrhythmia RSR' in V1 or  V2, probably normal variant Borderline T wave abnormalities No significant  change since last tracing Confirmed by Mariabelen Pressly  MD, Zanai Mallari [phone]) on  08/12/2014 11:23:07 PM     Patient has appointment to see Dr. Arnoldo Morale early December He has not been taking his pain medication.  Will attempt to get better control of pain management as outpatient. MDM   Final diagnoses:  Chronic back  pain        Dot Lanes, MD 08/14/14 [phone]

## 2014-08-15 ENCOUNTER — Encounter (HOSPITAL_COMMUNITY): Payer: Self-pay | Admitting: Psychiatry

## 2014-08-15 DIAGNOSIS — F323 Major depressive disorder, single episode, severe with psychotic features: Secondary | ICD-10-CM | POA: Diagnosis present

## 2014-08-15 DIAGNOSIS — R45851 Suicidal ideations: Secondary | ICD-10-CM

## 2014-08-15 DIAGNOSIS — R443 Hallucinations, unspecified: Secondary | ICD-10-CM | POA: Insufficient documentation

## 2014-08-15 DIAGNOSIS — F1410 Cocaine abuse, uncomplicated: Secondary | ICD-10-CM

## 2014-08-15 LAB — CBG MONITORING, ED
GLUCOSE-CAPILLARY: 298 mg/dL — AB (ref 70–99)
GLUCOSE-CAPILLARY: 246 mg/dL — AB (ref 70–99)
GLUCOSE-CAPILLARY: 270 mg/dL — AB (ref 70–99)
Glucose-Capillary: 207 mg/dL — ABNORMAL HIGH (ref 70–99)
Glucose-Capillary: 239 mg/dL — ABNORMAL HIGH (ref 70–99)

## 2014-08-15 LAB — BASIC METABOLIC PANEL
ANION GAP: 12 (ref 5–15)
BUN: 18 mg/dL (ref 6–23)
CO2: 24 mEq/L (ref 19–32)
Calcium: 9.5 mg/dL (ref 8.4–10.5)
Chloride: 101 mEq/L (ref 96–112)
Creatinine, Ser: 1.22 mg/dL (ref 0.50–1.35)
GFR, EST AFRICAN AMERICAN: 71 mL/min — AB (ref >90)
GFR, EST NON AFRICAN AMERICAN: 61 mL/min — AB (ref >90)
Glucose, Bld: 283 mg/dL — ABNORMAL HIGH (ref 70–99)
POTASSIUM: 4.1 meq/L (ref 3.7–5.3)
SODIUM: 137 meq/L (ref 137–147)

## 2014-08-15 LAB — URINE CULTURE
Colony Count: NO GROWTH
Culture: NO GROWTH

## 2014-08-15 MED ORDER — METFORMIN HCL 500 MG PO TABS
1000.0000 mg | ORAL_TABLET | Freq: Two times a day (BID) | ORAL | Status: DC
  Administered 2014-08-15 – 2014-08-17 (×5): 1000 mg via ORAL
  Filled 2014-08-15 (×8): qty 2

## 2014-08-15 NOTE — ED Provider Notes (Signed)
  Physical Exam  BP 133/75 mmHg  Pulse 72  Temp(Src) 97.7 F (36.5 C) (Oral)  Resp 18  Ht 5\' 7"  (1.702 m)  Wt 176 lb (79.833 kg)  BMI 27.56 kg/m2  SpO2 99%  Physical Exam  ED Course  Procedures  MDM Patient here with hallucinations. On arrival, Cr. 1.5, baseline 0.8. Started on home insulin yesterday. Today CBG better controlled. Repeat BMp showed Cr improved. Medically cleared.       Wandra Arthurs, MD 08/15/14 1336

## 2014-08-15 NOTE — ED Notes (Signed)
Pt ambulated with sitter to room 28 at this time.

## 2014-08-15 NOTE — ED Notes (Signed)
SITTER CHECK LIST VERIFIED
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
## 2014-08-16 ENCOUNTER — Encounter (HOSPITAL_COMMUNITY): Payer: Self-pay | Admitting: Registered Nurse

## 2014-08-16 LAB — CBG MONITORING, ED
GLUCOSE-CAPILLARY: 90 mg/dL (ref 70–99)
GLUCOSE-CAPILLARY: 255 mg/dL — AB (ref 70–99)
Glucose-Capillary: 191 mg/dL — ABNORMAL HIGH (ref 70–99)
Glucose-Capillary: 138 mg/dL — ABNORMAL HIGH (ref 70–99)

## 2014-08-16 NOTE — ED Notes (Signed)
Pt belongings (2 bags) moved to locker 40 when pt transferred to [facility]40.

## 2014-08-16 NOTE — ED Notes (Signed)
Pt is A&OX4 and is resting in bed most of the day. Pt is hoping to be placed in a facility and to receive additional support after d/c. Pt mood is sad/depressed and his affect is flat. Pt endorses passive SI but contracts for safety. Pt ambulates independently but his gait is somewhat unsteady. Writer moved pt closer to the nurses station to make it easier to reach the bathroom and request assistance from staff. Writer will continue to monitor.

## 2014-08-16 NOTE — Consult Note (Signed)
[facility] Consult   Reason for Consult:  Suicidal ideations Referring Physician:  EDP  Michael [name] is an 63 y.o. male. Total Time spent with patient: 45 minutes  Assessment: AXIS I:  Severe major depression with psychotic features; cocaine abuse AXIS II:  Deferred AXIS III:   Past Medical History  Diagnosis Date  . Diabetes mellitus     Diagnosed in 2002, started insulin in 2012  . Hypertension   . Headache(784.0)     CT head 08/2011: Periventricular and subcortical white matter hypodensities are most in keeping with chronic microangiopathic change  . Gout   . Hyperlipidemia   . HIV infection Nov 2012    Followed by [name]  . Crack cocaine use     for 20+ years, has been enrolled in detox programs in the past  . Chondromalacia of medial femoral condyle     Left knee MRI 04/28/12: Chondromalacia of the medial femoral condyle with slight peripheral degeneration of the meniscocapsular junction of the medial meniscus; followed by sports medicine  . Asthma     No PFTs, history of childhood asthma  . Depression     with history of hospitalization for suicidal ideation  . CAD (coronary artery disease)     a. 06/2013 STEMI/PCI ([facility]): LAD w/ thrombus (treated with BMS), mid 75%, D2 75%; LCX OM2 75%; RCA small, PDA 95%, PLV 95%;  b. 10/2013 Cath/PCI: ISR w/in LAD (Promus DES x 2), borderline OM2 lesion;  c. 01/2014 MV: Intermediate risk, medium-sized distal ant wall infarct w/ very small amt of peri-infarct ischemia. EF 60%.  . Gout 04/28/2012  . Collagen vascular disease   . Cellulitis 04/2014    left facial   AXIS IV:  economic problems, housing problems, other psychosocial or environmental problems, problems related to social environment and problems with primary support group AXIS V:  21-30 behavior considerably influenced by delusions or hallucinations OR serious impairment in judgment, communication OR inability to function in almost all areas  Plan:   Recommend psychiatric Inpatient admission when medically cleared.  Subjective:   Michael [name] is a 63 y.o. male patient admitted with suicidal ideations and a plan to overdose.  HPI:  Patient complains of neck pain and states that he is seeing Dr. Frederich Cha outpatient.  Patient continues to endorse auditory hallucinations stating that voices are telling him to hurt himself and times and at other times the voices tell him "to get up and move around"  Patient denies homicidal ideation.     HPI Elements:   Location:  generalized. Quality:  acute. Severity:  severe. Timing:  constant. Duration:  few days. Context:  stressors.  Past Psychiatric History: Past Medical History  Diagnosis Date  . Diabetes mellitus     Diagnosed in 2002, started insulin in 2012  . Hypertension   . Headache(784.0)     CT head 08/2011: Periventricular and subcortical white matter hypodensities are most in keeping with chronic microangiopathic change  . Gout   . Hyperlipidemia   . HIV infection Nov 2012    Followed by [name]  . Crack cocaine use     for 20+ years, has been enrolled in detox programs in the past  . Chondromalacia of medial femoral condyle     Left knee MRI 04/28/12: Chondromalacia of the medial femoral condyle with slight peripheral degeneration of the meniscocapsular junction of the medial meniscus; followed by sports medicine  . Asthma     No PFTs,  history of childhood asthma  . Depression     with history of hospitalization for suicidal ideation  . CAD (coronary artery disease)     a. 06/2013 STEMI/PCI ([facility]): LAD w/ thrombus (treated with BMS), mid 75%, D2 75%; LCX OM2 75%; RCA small, PDA 95%, PLV 95%;  b. 10/2013 Cath/PCI: ISR w/in LAD (Promus DES x 2), borderline OM2 lesion;  c. 01/2014 MV: Intermediate risk, medium-sized distal ant wall infarct w/ very small amt of peri-infarct ischemia. EF 60%.  . Gout 04/28/2012  . Collagen vascular disease   . Cellulitis 04/2014    left  facial    reports that he has never smoked. He has never used smokeless tobacco. He reports that he uses illicit drugs ("Crack" cocaine and Cocaine). He reports that he does not drink alcohol. Family History  Problem Relation Age of Onset  . Diabetes Mother   . Diabetes Father   . Diabetes Brother   . Colon cancer Neg Hx    Family History Substance Abuse: No Family Supports: Yes, List: (Sister ) Living Arrangements: Other relatives (Sister) Can pt return to current living arrangement?: Yes Abuse/Neglect [facility]) Physical Abuse: Denies Verbal Abuse: Denies Sexual Abuse: Denies Allergies:  No Known Allergies  ACT Assessment Complete:  Yes:    Educational Status    Risk to Self: Risk to self with the past 6 months Suicidal Ideation: Yes-Currently Present Suicidal Intent: Yes-Currently Present Is patient at risk for suicide?: Yes Suicidal Plan?: Yes-Currently Present Specify Current Suicidal Plan: "The voices are telling me to take pills".  Access to Means: Yes Specify Access to Suicidal Means: Pt has access to pills.  What has been your use of drugs/alcohol within the last 12 months?: Pt did not report any alcohol or drug use but UDS is positive for cocaine.  Previous Attempts/Gestures: Yes How many times?: 1 Other Self Harm Risks: No other self harm risk identified at this time.  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) Persecutory voices/beliefs?: Yes Depression: Yes Depression Symptoms: Despondent, Insomnia, Tearfulness, Isolating, Fatigue, Loss of interest in usual pleasures, Guilt, Feeling worthless/self pity, Feeling angry/irritable Substance abuse history and/or treatment for substance abuse?: Yes Suicide prevention information given to non-admitted patients: Not applicable  Risk to Others: Risk to Others within the past 6 months Homicidal  Ideation: No Thoughts of Harm to Others: No Current Homicidal Intent: No Current Homicidal Plan: No Access to Homicidal Means: No Identified Victim: NA History of harm to others?: No Assessment of Violence: None Noted Violent Behavior Description: No violent behaviors observed. Pt is calm and cooperative at this time.  Does patient have access to weapons?: No Criminal Charges Pending?: No Does patient have a court date: No  Abuse: Abuse/Neglect Assessment (Assessment to be complete while patient is alone) Physical Abuse: Denies Verbal Abuse: Denies Sexual Abuse: Denies Exploitation of patient/patient's resources: Denies Self-Neglect: Denies  Prior Inpatient Therapy: Prior Inpatient Therapy Prior Inpatient Therapy: Yes Prior Therapy Dates: 2012 Prior Therapy Facilty/Provider(s): [facility] Reason for Treatment: SA  Prior Outpatient Therapy: Prior Outpatient Therapy Prior Outpatient Therapy: No  Additional Information: Additional Information 1:1 In Past 12 Months?: No CIRT Risk: No Elopement Risk: No                  Objective: Blood pressure 108/71, pulse 117, temperature 97.8 F (36.6 C), temperature source Oral, resp. rate 16, height 5' 7" (1.702 m), weight 79.833 kg (  176 lb), SpO2 100 %.Body mass index is 27.56 kg/(m^2). Results for orders placed or performed during the hospital encounter of 08/13/14 (from the past 72 hour(s))  CBC WITH DIFFERENTIAL     Status: Abnormal   Collection Time: 08/13/14  9:08 PM  Result Value Ref Range   WBC 3.4 (L) 4.0 - 10.5 K/uL   RBC 4.29 4.22 - 5.81 MIL/uL   Hemoglobin 11.2 (L) 13.0 - 17.0 g/dL   HCT 34.2 (L) 39.0 - 52.0 %   MCV 79.7 78.0 - 100.0 fL   MCH 26.1 26.0 - 34.0 pg   MCHC 32.7 30.0 - 36.0 g/dL   RDW 14.6 11.5 - 15.5 %   Platelets 186 150 - 400 K/uL   Neutrophils Relative % 60 43 - 77 %   Neutro Abs 2.1 1.7 - 7.7 K/uL   Lymphocytes Relative 31 12 - 46 %   Lymphs Abs 1.1 0.7 - 4.0 K/uL   Monocytes Relative 6 3 - 12 %    Monocytes Absolute 0.2 0.1 - 1.0 K/uL   Eosinophils Relative 3 0 - 5 %   Eosinophils Absolute 0.1 0.0 - 0.7 K/uL   Basophils Relative 0 0 - 1 %   Basophils Absolute 0.0 0.0 - 0.1 K/uL  Comprehensive metabolic panel     Status: Abnormal   Collection Time: 08/13/14  9:08 PM  Result Value Ref Range   Sodium 137 137 - 147 mEq/L   Potassium 3.8 3.7 - 5.3 mEq/L   Chloride 99 96 - 112 mEq/L   CO2 27 19 - 32 mEq/L   Glucose, Bld 333 (H) 70 - 99 mg/dL   BUN 19 6 - 23 mg/dL   Creatinine, Ser 1.57 (H) 0.50 - 1.35 mg/dL   Calcium 9.6 8.4 - 10.5 mg/dL   Total Protein 7.7 6.0 - 8.3 g/dL   Albumin 3.5 3.5 - 5.2 g/dL   AST 14 0 - 37 U/L   ALT 13 0 - 53 U/L   Alkaline Phosphatase 86 39 - 117 U/L   Total Bilirubin <0.2 (L) 0.3 - 1.2 mg/dL   GFR calc non Af Amer 45 (L) >90 mL/min   GFR calc Af Amer 52 (L) >90 mL/min    Comment: (NOTE) The eGFR has been calculated using the CKD EPI equation. This calculation has not been validated in all clinical situations. eGFR's persistently <90 mL/min signify possible Chronic Kidney Disease.    Anion gap 11 5 - 15  Ethanol     Status: None   Collection Time: 08/13/14  9:08 PM  Result Value Ref Range   Alcohol, Ethyl (B) <11 0 - 11 mg/dL    Comment:        LOWEST DETECTABLE LIMIT FOR SERUM ALCOHOL IS 11 mg/dL FOR MEDICAL PURPOSES ONLY   Urine culture     Status: None   Collection Time: 08/13/14  9:14 PM  Result Value Ref Range   Specimen Description URINE, CLEAN CATCH    Special Requests NONE    Culture  Setup Time      08/14/2014 01:22 Performed at [place] Performed at [facility]     Culture NO GROWTH Performed at [facility]     Report Status 08/15/2014 FINAL   Urinalysis, Routine w reflex microscopic     Status: Abnormal   Collection Time: 08/13/14  9:14 PM  Result Value Ref Range   Color, Urine YELLOW YELLOW  APPearance CLEAR CLEAR   Specific Gravity, Urine 1.034 (H) 1.005 -  1.030   pH 6.0 5.0 - 8.0   Glucose, UA >1000 (A) NEGATIVE mg/dL   Hgb urine dipstick SMALL (A) NEGATIVE   Bilirubin Urine NEGATIVE NEGATIVE   Ketones, ur NEGATIVE NEGATIVE mg/dL   Protein, ur NEGATIVE NEGATIVE mg/dL   Urobilinogen, UA 1.0 0.0 - 1.0 mg/dL   Nitrite NEGATIVE NEGATIVE   Leukocytes, UA NEGATIVE NEGATIVE  Drug screen panel, emergency     Status: Abnormal   Collection Time: 08/13/14  9:14 PM  Result Value Ref Range   Opiates NONE DETECTED NONE DETECTED   Cocaine POSITIVE (A) NONE DETECTED   Benzodiazepines NONE DETECTED NONE DETECTED   Amphetamines NONE DETECTED NONE DETECTED   Tetrahydrocannabinol NONE DETECTED NONE DETECTED   Barbiturates NONE DETECTED NONE DETECTED    Comment:        DRUG SCREEN FOR MEDICAL PURPOSES ONLY.  IF CONFIRMATION IS NEEDED FOR ANY PURPOSE, NOTIFY LAB WITHIN 5 DAYS.        LOWEST DETECTABLE LIMITS FOR URINE DRUG SCREEN Drug Class       Cutoff (ng/mL) Amphetamine      1000 Barbiturate      200 Benzodiazepine   102 Tricyclics       585 Opiates          300 Cocaine          300 THC              50   Urine microscopic-add on     Status: None   Collection Time: 08/13/14  9:14 PM  Result Value Ref Range   RBC / HPF 0-2 <3 RBC/hpf   Bacteria, UA RARE RARE  Acetaminophen level     Status: None   Collection Time: 08/14/14  7:56 AM  Result Value Ref Range   Acetaminophen (Tylenol), Serum <15.0 10 - 30 ug/mL    Comment:        THERAPEUTIC CONCENTRATIONS VARY SIGNIFICANTLY. A RANGE OF 10-30 ug/mL MAY BE AN EFFECTIVE CONCENTRATION FOR MANY PATIENTS. HOWEVER, SOME ARE BEST TREATED AT CONCENTRATIONS OUTSIDE THIS RANGE. ACETAMINOPHEN CONCENTRATIONS >150 ug/mL AT 4 HOURS AFTER INGESTION AND >50 ug/mL AT 12 HOURS AFTER INGESTION ARE OFTEN ASSOCIATED WITH TOXIC REACTIONS.   Salicylate level     Status: Abnormal   Collection Time: 08/14/14  7:56 AM  Result Value Ref Range   Salicylate Lvl <2.7 (L) 2.8 - 20.0 mg/dL  CBG monitoring, ED      Status: Abnormal   Collection Time: 08/14/14  3:30 PM  Result Value Ref Range   Glucose-Capillary 346 (H) 70 - 99 mg/dL  Basic metabolic panel     Status: Abnormal   Collection Time: 08/14/14  5:27 PM  Result Value Ref Range   Sodium 138 137 - 147 mEq/L   Potassium 4.0 3.7 - 5.3 mEq/L   Chloride 103 96 - 112 mEq/L   CO2 22 19 - 32 mEq/L   Glucose, Bld 413 (H) 70 - 99 mg/dL   BUN 19 6 - 23 mg/dL   Creatinine, Ser 1.28 0.50 - 1.35 mg/dL   Calcium 9.1 8.4 - 10.5 mg/dL   GFR calc non Af Amer 58 (L) >90 mL/min   GFR calc Af Amer 67 (L) >90 mL/min    Comment: (NOTE) The eGFR has been calculated using the CKD EPI equation. This calculation has not been validated in all clinical situations. eGFR's persistently <90 mL/min signify possible Chronic  Kidney Disease.    Anion gap 13 5 - 15  CBG monitoring, ED     Status: Abnormal   Collection Time: 08/14/14  7:43 PM  Result Value Ref Range   Glucose-Capillary 356 (H) 70 - 99 mg/dL  CBG monitoring, ED     Status: Abnormal   Collection Time: 08/15/14  8:07 AM  Result Value Ref Range   Glucose-Capillary 298 (H) 70 - 99 mg/dL   Comment 1 Notify RN   Basic metabolic panel     Status: Abnormal   Collection Time: 08/15/14 12:23 PM  Result Value Ref Range   Sodium 137 137 - 147 mEq/L   Potassium 4.1 3.7 - 5.3 mEq/L   Chloride 101 96 - 112 mEq/L   CO2 24 19 - 32 mEq/L   Glucose, Bld 283 (H) 70 - 99 mg/dL   BUN 18 6 - 23 mg/dL   Creatinine, Ser 1.22 0.50 - 1.35 mg/dL   Calcium 9.5 8.4 - 10.5 mg/dL   GFR calc non Af Amer 61 (L) >90 mL/min   GFR calc Af Amer 71 (L) >90 mL/min    Comment: (NOTE) The eGFR has been calculated using the CKD EPI equation. This calculation has not been validated in all clinical situations. eGFR's persistently <90 mL/min signify possible Chronic Kidney Disease.    Anion gap 12 5 - 15  CBG monitoring, ED     Status: Abnormal   Collection Time: 08/15/14 12:37 PM  Result Value Ref Range   Glucose-Capillary 270  (H) 70 - 99 mg/dL  CBG monitoring, ED     Status: Abnormal   Collection Time: 08/15/14  1:17 PM  Result Value Ref Range   Glucose-Capillary 246 (H) 70 - 99 mg/dL  CBG monitoring, ED     Status: Abnormal   Collection Time: 08/15/14  5:34 PM  Result Value Ref Range   Glucose-Capillary 207 (H) 70 - 99 mg/dL   Comment 1 Notify RN    Comment 2 Documented in Chart   POC CBG, ED     Status: Abnormal   Collection Time: 08/15/14  9:30 PM  Result Value Ref Range   Glucose-Capillary 239 (H) 70 - 99 mg/dL  POC CBG, ED     Status: Abnormal   Collection Time: 08/16/14  8:12 AM  Result Value Ref Range   Glucose-Capillary 191 (H) 70 - 99 mg/dL  POC CBG, ED     Status: Abnormal   Collection Time: 08/16/14 12:57 PM  Result Value Ref Range   Glucose-Capillary 138 (H) 70 - 99 mg/dL   Comment 1 Notify RN    Labs are reviewed and are pertinent for medical issues being addressed.  Current Facility-Administered Medications  Medication Dose Route Frequency Provider Last Rate Last Dose  . acetaminophen (TYLENOL) tablet 650 mg  650 mg Oral Q4H PRN Noland Fordyce, PA-C   650 mg at 08/16/14 0851  . allopurinol (ZYLOPRIM) tablet 200 mg  200 mg Oral Daily Evelina Bucy, MD   200 mg at 08/16/14 1005  . alum & mag hydroxide-simeth (MAALOX/MYLANTA) 200-200-20 MG/5ML suspension 30 mL  30 mL Oral PRN Noland Fordyce, PA-C      . aspirin EC tablet 81 mg  81 mg Oral Daily Evelina Bucy, MD   81 mg at 08/16/14 1006  . clopidogrel (PLAVIX) tablet 75 mg  75 mg Oral Daily Evelina Bucy, MD   75 mg at 08/16/14 1005  . diltiazem (DILACOR XR) 24 hr capsule 120 mg  120  mg Oral Daily Evelina Bucy, MD   120 mg at 08/16/14 1005  . DULoxetine (CYMBALTA) DR capsule 60 mg  60 mg Oral Daily Evelina Bucy, MD   60 mg at 08/16/14 1005  . Emtricitab-Rilpivir-Tenofovir 200-25-300 MG TABS 1 tablet  1 tablet Oral Daily Evelina Bucy, MD   1 tablet at 08/16/14 1005  . famotidine (PEPCID) tablet 20 mg  20 mg Oral Daily PRN Evelina Bucy, MD      .  gabapentin (NEURONTIN) capsule 300 mg  300 mg Oral TID Evelina Bucy, MD   300 mg at 08/16/14 1005  . ibuprofen (ADVIL,MOTRIN) tablet 600 mg  600 mg Oral Q8H PRN Noland Fordyce, PA-C   600 mg at 08/15/14 0801  . insulin aspart (novoLOG) injection 4 Units  4 Units Subcutaneous TID [facility] Evelina Bucy, MD   4 Units at 08/16/14 1311  . insulin glargine (LANTUS) injection 30 Units  30 Units Subcutaneous QHS Evelina Bucy, MD   30 Units at 08/15/14 2239  . LORazepam (ATIVAN) tablet 1 mg  1 mg Oral Q8H PRN Noland Fordyce, PA-C      . metFORMIN (GLUCOPHAGE) tablet 1,000 mg  1,000 mg Oral BID WC Waylan Boga, NP   1,000 mg at 08/16/14 0815  . nicotine (NICODERM CQ - dosed in mg/24 hours) patch 21 mg  21 mg Transdermal Daily Noland Fordyce, PA-C   21 mg at 08/14/14 1045  . ondansetron (ZOFRAN) tablet 4 mg  4 mg Oral Q8H PRN Noland Fordyce, PA-C      . pravastatin (PRAVACHOL) tablet 40 mg  40 mg Oral Daily Evelina Bucy, MD   40 mg at 08/16/14 1005  . zolpidem (AMBIEN) tablet 5 mg  5 mg Oral QHS PRN Noland Fordyce, PA-C       Current Outpatient Prescriptions  Medication Sig Dispense Refill  . allopurinol (ZYLOPRIM) 100 MG tablet Take 200 mg by mouth daily.    Marland Kitchen aspirin EC 81 MG tablet Take 81 mg by mouth daily.    . clopidogrel (PLAVIX) 75 MG tablet Take 75 mg by mouth daily.    . diclofenac (VOLTAREN) 25 MG EC tablet Take 1 tablet (25 mg total) by mouth 3 (three) times daily as needed. (Patient taking differently: Take 25 mg by mouth 3 (three) times daily as needed for mild pain. ) 90 tablet 0  . diclofenac sodium (VOLTAREN) 1 % GEL Apply 2 g topically daily as needed (pain).    Marland Kitchen diltiazem (DILACOR XR) 120 MG 24 hr capsule Take 120 mg by mouth daily.    . DULoxetine (CYMBALTA) 60 MG capsule Take 60 mg by mouth daily.    . Emtricitab-Rilpivir-Tenofovir (COMPLERA) 200-25-300 MG TABS Take 1 tablet by mouth daily.    . famotidine (PEPCID) 20 MG tablet Take 1 tablet (20 mg total) by mouth daily at 8 pm. (Patient taking  differently: Take 20 mg by mouth daily as needed for heartburn. ) 30 tablet 3  . fentaNYL (DURAGESIC) 12 MCG/HR Place 1 patch (12.5 mcg total) onto the skin every 3 (three) days. 5 patch 0  . gabapentin (NEURONTIN) 300 MG capsule Take 300 mg by mouth 3 (three) times daily.    Marland Kitchen ibuprofen (ADVIL,MOTRIN) 600 MG tablet Take 1 tablet (600 mg total) by mouth every 6 (six) hours as needed. 30 tablet 0  . insulin aspart (NOVOLOG) 100 UNIT/ML injection Inject 4 Units into the skin 3 (three) times daily before meals.     . Insulin Glargine (LANTUS) 100 UNIT/ML  Solostar Pen Inject 30 Units into the skin at bedtime.    Marland Kitchen ipratropium-albuterol (DUONEB) 0.5-2.5 (3) MG/3ML SOLN Take 3 mLs by nebulization every 4 (four) hours as needed (shortness of breath).    . isosorbide mononitrate (IMDUR) 30 MG 24 hr tablet Take 1 tablet (30 mg total) by mouth daily. 30 tablet 6  . lisinopril (PRINIVIL,ZESTRIL) 10 MG tablet Take 5 mg by mouth daily.     . metFORMIN (GLUCOPHAGE) 1000 MG tablet Take 1,000 mg by mouth 2 (two) times daily with a meal.    . methocarbamol (ROBAXIN) 500 MG tablet Take 1 tablet (500 mg total) by mouth every 6 (six) hours as needed for muscle spasms (or pain). 20 tablet 0  . oxyCODONE-acetaminophen (PERCOCET) 7.5-325 MG per tablet Take 1-2 tablets by mouth every 4 (four) hours as needed for pain. 20 tablet 0  . pravastatin (PRAVACHOL) 40 MG tablet Take 40 mg by mouth daily.    . nitroGLYCERIN (NITROSTAT) 0.4 MG SL tablet Place 0.4 mg under the tongue every 5 (five) minutes as needed for chest pain.    . traMADol (ULTRAM) 50 MG tablet Take 50 mg by mouth every 6 (six) hours as needed for moderate pain.      Psychiatric Specialty Exam:     Blood pressure 108/71, pulse 117, temperature 97.8 F (36.6 C), temperature source Oral, resp. rate 16, height 5' 7" (1.702 m), weight 79.833 kg (176 lb), SpO2 100 %.Body mass index is 27.56 kg/(m^2).  General Appearance: Disheveled  Eye Sport and exercise psychologist::  Fair   Speech:  Normal Rate  Volume:  Normal  Mood:  Depressed  Affect:  Congruent  Thought Process:  Coherent  Orientation:  Full (Time, Place, and Person)  Thought Content:  Hallucinations: Auditory Visual  Suicidal Thoughts:  Yes.  with intent/plan  Homicidal Thoughts:  No  Memory:  Immediate;   Fair Recent;   Fair Remote;   Fair  Judgement:  Poor  Insight:  Fair  Psychomotor Activity:  Decreased  Concentration:  Fair  Recall:  Grainger: Fair  Akathisia:  No  Handed:  Right  AIMS (if indicated):     Assets:  Leisure Time Resilience  Sleep:      Musculoskeletal: Strength & Muscle Tone: within normal limits Gait & Station: normal Patient leans: N/A  Treatment Plan Summary: Daily contact with patient to assess and evaluate symptoms and progress in treatment Medication management; restart home medications, admit to gero-psychiatry for stabilization.  Will continue with current treatment plan for inpatient treatment gero psych  Earleen Newport, FNP-BC 08/16/2014 1:43 PM   Patient seen, evaluated and I agree with notes by Nurse Practitioner. Corena Pilgrim, MD

## 2014-08-17 DIAGNOSIS — L089 Local infection of the skin and subcutaneous tissue, unspecified: Secondary | ICD-10-CM | POA: Insufficient documentation

## 2014-08-17 DIAGNOSIS — S90822A Blister (nonthermal), left foot, initial encounter: Secondary | ICD-10-CM

## 2014-08-17 LAB — CBG MONITORING, ED
GLUCOSE-CAPILLARY: 158 mg/dL — AB (ref 70–99)
Glucose-Capillary: 93 mg/dL (ref 70–99)
Glucose-Capillary: 85 mg/dL (ref 70–99)
Glucose-Capillary: 104 mg/dL — ABNORMAL HIGH (ref 70–99)

## 2014-08-17 NOTE — ED Notes (Signed)
Notified Dietary Ria Comment) of pending nutritional consult

## 2014-08-17 NOTE — ED Notes (Signed)
Blisters noted to bottom of left foot. Skin intact, no drainage noted. Wound and Podiatry consults pending

## 2014-08-17 NOTE — ED Notes (Addendum)
Pt resting on stretcher, AAO x 3, no distress.  Reports Auditory hallucinations, telling him nothing is wrong and Visual hallucinations, seeing bugs.  Pt calm & cooperative, will monitor for safety.

## 2014-08-17 NOTE — Consult Note (Signed)
[facility] Consult   Reason for Consult:  Suicidal ideations Referring Physician:  EDP  Michael [name] is an 63 y.o. male. Total Time spent with patient: 45 minutes  Assessment: AXIS I:  Severe major depression with psychotic features; cocaine abuse AXIS II:  Deferred AXIS III:   Past Medical History  Diagnosis Date  . Diabetes mellitus     Diagnosed in 2002, started insulin in 2012  . Hypertension   . Headache(784.0)     CT head 08/2011: Periventricular and subcortical white matter hypodensities are most in keeping with chronic microangiopathic change  . Gout   . Hyperlipidemia   . HIV infection Nov 2012    Followed by [name]  . Crack cocaine use     for 20+ years, has been enrolled in detox programs in the past  . Chondromalacia of medial femoral condyle     Left knee MRI 04/28/12: Chondromalacia of the medial femoral condyle with slight peripheral degeneration of the meniscocapsular junction of the medial meniscus; followed by sports medicine  . Asthma     No PFTs, history of childhood asthma  . Depression     with history of hospitalization for suicidal ideation  . CAD (coronary artery disease)     a. 06/2013 STEMI/PCI ([facility]): LAD w/ thrombus (treated with BMS), mid 75%, D2 75%; LCX OM2 75%; RCA small, PDA 95%, PLV 95%;  b. 10/2013 Cath/PCI: ISR w/in LAD (Promus DES x 2), borderline OM2 lesion;  c. 01/2014 MV: Intermediate risk, medium-sized distal ant wall infarct w/ very small amt of peri-infarct ischemia. EF 60%.  . Gout 04/28/2012  . Collagen vascular disease   . Cellulitis 04/2014    left facial   AXIS IV:  economic problems, housing problems, other psychosocial or environmental problems, problems related to social environment and problems with primary support group AXIS V:  21-30 behavior considerably influenced by delusions or hallucinations OR serious impairment in judgment, communication OR inability to function in almost all areas  Plan:   Recommend psychiatric Inpatient admission when medically cleared.  Subjective:   Michael [name] is a 63 y.o. male patient admitted with suicidal ideations and a plan to overdose.  HPI: Patient continues to endorse that voices are telling him to hurt him self.  Patient states that he slept well last night but pain continues to increase.  Patient had complaints about his feet last night and was noted that patient had a blister on his foot.  Patient left fool has a blister that looks dark and possible infection.  Pediatry and wound management consulted.      HPI Elements:   Location:  generalized. Quality:  acute. Severity:  severe. Timing:  constant. Duration:  few days. Context:  stressors.  Past Psychiatric History: Past Medical History  Diagnosis Date  . Diabetes mellitus     Diagnosed in 2002, started insulin in 2012  . Hypertension   . Headache(784.0)     CT head 08/2011: Periventricular and subcortical white matter hypodensities are most in keeping with chronic microangiopathic change  . Gout   . Hyperlipidemia   . HIV infection Nov 2012    Followed by [name]  . Crack cocaine use     for 20+ years, has been enrolled in detox programs in the past  . Chondromalacia of medial femoral condyle     Left knee MRI 04/28/12: Chondromalacia of the medial femoral condyle with slight peripheral degeneration of the meniscocapsular junction of the  medial meniscus; followed by sports medicine  . Asthma     No PFTs, history of childhood asthma  . Depression     with history of hospitalization for suicidal ideation  . CAD (coronary artery disease)     a. 06/2013 STEMI/PCI ([facility]): LAD w/ thrombus (treated with BMS), mid 75%, D2 75%; LCX OM2 75%; RCA small, PDA 95%, PLV 95%;  b. 10/2013 Cath/PCI: ISR w/in LAD (Promus DES x 2), borderline OM2 lesion;  c. 01/2014 MV: Intermediate risk, medium-sized distal ant wall infarct w/ very small amt of peri-infarct ischemia. EF 60%.  . Gout 04/28/2012  .  Collagen vascular disease   . Cellulitis 04/2014    left facial    reports that he has never smoked. He has never used smokeless tobacco. He reports that he uses illicit drugs ("Crack" cocaine and Cocaine). He reports that he does not drink alcohol. Family History  Problem Relation Age of Onset  . Diabetes Mother   . Diabetes Father   . Diabetes Brother   . Colon cancer Neg Hx    Family History Substance Abuse: No Family Supports: Yes, List: (Sister ) Living Arrangements: Other relatives (Sister) Can pt return to current living arrangement?: Yes Abuse/Neglect [facility]) Physical Abuse: Denies Verbal Abuse: Denies Sexual Abuse: Denies Allergies:  No Known Allergies  ACT Assessment Complete:  Yes:    Educational Status    Risk to Self: Risk to self with the past 6 months Suicidal Ideation: Yes-Currently Present Suicidal Intent: Yes-Currently Present Is patient at risk for suicide?: Yes Suicidal Plan?: Yes-Currently Present Specify Current Suicidal Plan: "The voices are telling me to take pills".  Access to Means: Yes Specify Access to Suicidal Means: Pt has access to pills.  What has been your use of drugs/alcohol within the last 12 months?: Pt did not report any alcohol or drug use but UDS is positive for cocaine.  Previous Attempts/Gestures: Yes How many times?: 1 Other Self Harm Risks: No other self harm risk identified at this time.  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) Persecutory voices/beliefs?: Yes Depression: Yes Depression Symptoms: Despondent, Insomnia, Tearfulness, Isolating, Fatigue, Loss of interest in usual pleasures, Guilt, Feeling worthless/self pity, Feeling angry/irritable Substance abuse history and/or treatment for substance abuse?: Yes Suicide prevention information given to non-admitted patients: Not applicable  Risk to  Others: Risk to Others within the past 6 months Homicidal Ideation: No Thoughts of Harm to Others: No Current Homicidal Intent: No Current Homicidal Plan: No Access to Homicidal Means: No Identified Victim: NA History of harm to others?: No Assessment of Violence: None Noted Violent Behavior Description: No violent behaviors observed. Pt is calm and cooperative at this time.  Does patient have access to weapons?: No Criminal Charges Pending?: No Does patient have a court date: No  Abuse: Abuse/Neglect Assessment (Assessment to be complete while patient is alone) Physical Abuse: Denies Verbal Abuse: Denies Sexual Abuse: Denies Exploitation of patient/patient's resources: Denies Self-Neglect: Denies  Prior Inpatient Therapy: Prior Inpatient Therapy Prior Inpatient Therapy: Yes Prior Therapy Dates: 2012 Prior Therapy Facilty/Provider(s): [facility] Reason for Treatment: SA  Prior Outpatient Therapy: Prior Outpatient Therapy Prior Outpatient Therapy: No  Additional Information: Additional Information 1:1 In Past 12 Months?: No CIRT Risk: No Elopement Risk: No                  Objective: Blood pressure 133/85, pulse 87, temperature 98.1 F (36.7  C), temperature source Oral, resp. rate 16, height _0  (1.702 m), weight 79.833 kg (176 lb), SpO2 100 %.Body mass index is 27.56 kg/(m^2). Results for orders placed or performed during the hospital encounter of 08/13/14 (from the past 72 hour(s))  CBG monitoring, ED     Status: Abnormal   Collection Time: 08/14/14  3:30 PM  Result Value Ref Range   Glucose-Capillary 346 (H) 70 - 99 mg/dL  Basic metabolic panel     Status: Abnormal   Collection Time: 08/14/14  5:27 PM  Result Value Ref Range   Sodium 138 137 - 147 mEq/L   Potassium 4.0 3.7 - 5.3 mEq/L   Chloride 103 96 - 112 mEq/L   CO2 22 19 - 32 mEq/L   Glucose, Bld 413 (H) 70 - 99 mg/dL   BUN 19 6 - 23 mg/dL   Creatinine, Ser 1.28 0.50 - 1.35 mg/dL   Calcium 9.1 8.4 -  10.5 mg/dL   GFR calc non Af Amer 58 (L) >90 mL/min   GFR calc Af Amer 67 (L) >90 mL/min    Comment: (NOTE) The eGFR has been calculated using the CKD EPI equation. This calculation has not been validated in all clinical situations. eGFR's persistently <90 mL/min signify possible Chronic Kidney Disease.    Anion gap 13 5 - 15  CBG monitoring, ED     Status: Abnormal   Collection Time: 08/14/14  7:43 PM  Result Value Ref Range   Glucose-Capillary 356 (H) 70 - 99 mg/dL  CBG monitoring, ED     Status: Abnormal   Collection Time: 08/15/14  8:07 AM  Result Value Ref Range   Glucose-Capillary 298 (H) 70 - 99 mg/dL   Comment 1 Notify RN   Basic metabolic panel     Status: Abnormal   Collection Time: 08/15/14 12:23 PM  Result Value Ref Range   Sodium 137 137 - 147 mEq/L   Potassium 4.1 3.7 - 5.3 mEq/L   Chloride 101 96 - 112 mEq/L   CO2 24 19 - 32 mEq/L   Glucose, Bld 283 (H) 70 - 99 mg/dL   BUN 18 6 - 23 mg/dL   Creatinine, Ser 1.22 0.50 - 1.35 mg/dL   Calcium 9.5 8.4 - 10.5 mg/dL   GFR calc non Af Amer 61 (L) >90 mL/min   GFR calc Af Amer 71 (L) >90 mL/min    Comment: (NOTE) The eGFR has been calculated using the CKD EPI equation. This calculation has not been validated in all clinical situations. eGFR's persistently <90 mL/min signify possible Chronic Kidney Disease.    Anion gap 12 5 - 15  CBG monitoring, ED     Status: Abnormal   Collection Time: 08/15/14 12:37 PM  Result Value Ref Range   Glucose-Capillary 270 (H) 70 - 99 mg/dL  CBG monitoring, ED     Status: Abnormal   Collection Time: 08/15/14  1:17 PM  Result Value Ref Range   Glucose-Capillary 246 (H) 70 - 99 mg/dL  CBG monitoring, ED     Status: Abnormal   Collection Time: 08/15/14  5:34 PM  Result Value Ref Range   Glucose-Capillary 207 (H) 70 - 99 mg/dL   Comment 1 Notify RN    Comment 2 Documented in Chart   POC CBG, ED     Status: Abnormal   Collection Time: 08/15/14  9:30 PM  Result Value Ref Range    Glucose-Capillary 239 (H) 70 - 99 mg/dL  POC CBG, ED  Status: Abnormal   Collection Time: 08/16/14  8:12 AM  Result Value Ref Range   Glucose-Capillary 191 (H) 70 - 99 mg/dL  POC CBG, ED     Status: Abnormal   Collection Time: 08/16/14 12:57 PM  Result Value Ref Range   Glucose-Capillary 138 (H) 70 - 99 mg/dL   Comment 1 Notify RN   POC CBG, ED     Status: None   Collection Time: 08/16/14  5:41 PM  Result Value Ref Range   Glucose-Capillary 90 70 - 99 mg/dL  POC CBG, ED     Status: Abnormal   Collection Time: 08/16/14 11:27 PM  Result Value Ref Range   Glucose-Capillary 255 (H) 70 - 99 mg/dL   Comment 1 Notify RN    Comment 2 Documented in Chart   POC CBG, ED     Status: None   Collection Time: 08/17/14  7:54 AM  Result Value Ref Range   Glucose-Capillary 93 70 - 99 mg/dL  POC CBG, ED     Status: None   Collection Time: 08/17/14 12:47 PM  Result Value Ref Range   Glucose-Capillary 85 70 - 99 mg/dL   Labs are reviewed and are pertinent for medical issues being addressed.  Current Facility-Administered Medications  Medication Dose Route Frequency Provider Last Rate Last Dose  . acetaminophen (TYLENOL) tablet 650 mg  650 mg Oral Q4H PRN Noland Fordyce, PA-C   650 mg at 08/17/14 0301  . allopurinol (ZYLOPRIM) tablet 200 mg  200 mg Oral Daily Evelina Bucy, MD   200 mg at 08/17/14 1023  . alum & mag hydroxide-simeth (MAALOX/MYLANTA) 200-200-20 MG/5ML suspension 30 mL  30 mL Oral PRN Noland Fordyce, PA-C      . aspirin EC tablet 81 mg  81 mg Oral Daily Evelina Bucy, MD   81 mg at 08/17/14 1023  . clopidogrel (PLAVIX) tablet 75 mg  75 mg Oral Daily Evelina Bucy, MD   75 mg at 08/17/14 1023  . diltiazem (DILACOR XR) 24 hr capsule 120 mg  120 mg Oral Daily Evelina Bucy, MD   120 mg at 08/17/14 1023  . DULoxetine (CYMBALTA) DR capsule 60 mg  60 mg Oral Daily Evelina Bucy, MD   60 mg at 08/17/14 1023  . Emtricitab-Rilpivir-Tenofovir 200-25-300 MG TABS 1 tablet  1 tablet Oral Daily Evelina Bucy, MD   1 tablet at 08/17/14 1023  . famotidine (PEPCID) tablet 20 mg  20 mg Oral Daily PRN Evelina Bucy, MD      . gabapentin (NEURONTIN) capsule 300 mg  300 mg Oral TID Evelina Bucy, MD   300 mg at 08/17/14 1023  . ibuprofen (ADVIL,MOTRIN) tablet 600 mg  600 mg Oral Q8H PRN Noland Fordyce, PA-C   600 mg at 08/15/14 0801  . insulin aspart (novoLOG) injection 4 Units  4 Units Subcutaneous TID [facility] Evelina Bucy, MD   4 Units at 08/17/14 1021  . insulin glargine (LANTUS) injection 30 Units  30 Units Subcutaneous QHS Evelina Bucy, MD   30 Units at 08/16/14 2115  . LORazepam (ATIVAN) tablet 1 mg  1 mg Oral Q8H PRN Noland Fordyce, PA-C   1 mg at 08/17/14 0301  . metFORMIN (GLUCOPHAGE) tablet 1,000 mg  1,000 mg Oral BID WC Waylan Boga, NP   1,000 mg at 08/17/14 1022  . ondansetron (ZOFRAN) tablet 4 mg  4 mg Oral Q8H PRN Noland Fordyce, PA-C      . pravastatin (PRAVACHOL) tablet 40 mg  40 mg  Oral Daily Evelina Bucy, MD   40 mg at 08/17/14 1022  . zolpidem (AMBIEN) tablet 5 mg  5 mg Oral QHS PRN Noland Fordyce, PA-C       Current Outpatient Prescriptions  Medication Sig Dispense Refill  . allopurinol (ZYLOPRIM) 100 MG tablet Take 200 mg by mouth daily.    Marland Kitchen aspirin EC 81 MG tablet Take 81 mg by mouth daily.    . clopidogrel (PLAVIX) 75 MG tablet Take 75 mg by mouth daily.    . diclofenac (VOLTAREN) 25 MG EC tablet Take 1 tablet (25 mg total) by mouth 3 (three) times daily as needed. (Patient taking differently: Take 25 mg by mouth 3 (three) times daily as needed for mild pain. ) 90 tablet 0  . diclofenac sodium (VOLTAREN) 1 % GEL Apply 2 g topically daily as needed (pain).    Marland Kitchen diltiazem (DILACOR XR) 120 MG 24 hr capsule Take 120 mg by mouth daily.    . DULoxetine (CYMBALTA) 60 MG capsule Take 60 mg by mouth daily.    . Emtricitab-Rilpivir-Tenofovir (COMPLERA) 200-25-300 MG TABS Take 1 tablet by mouth daily.    . famotidine (PEPCID) 20 MG tablet Take 1 tablet (20 mg total) by mouth daily at 8 pm.  (Patient taking differently: Take 20 mg by mouth daily as needed for heartburn. ) 30 tablet 3  . fentaNYL (DURAGESIC) 12 MCG/HR Place 1 patch (12.5 mcg total) onto the skin every 3 (three) days. 5 patch 0  . gabapentin (NEURONTIN) 300 MG capsule Take 300 mg by mouth 3 (three) times daily.    Marland Kitchen ibuprofen (ADVIL,MOTRIN) 600 MG tablet Take 1 tablet (600 mg total) by mouth every 6 (six) hours as needed. 30 tablet 0  . insulin aspart (NOVOLOG) 100 UNIT/ML injection Inject 4 Units into the skin 3 (three) times daily before meals.     . Insulin Glargine (LANTUS) 100 UNIT/ML Solostar Pen Inject 30 Units into the skin at bedtime.    Marland Kitchen ipratropium-albuterol (DUONEB) 0.5-2.5 (3) MG/3ML SOLN Take 3 mLs by nebulization every 4 (four) hours as needed (shortness of breath).    . isosorbide mononitrate (IMDUR) 30 MG 24 hr tablet Take 1 tablet (30 mg total) by mouth daily. 30 tablet 6  . lisinopril (PRINIVIL,ZESTRIL) 10 MG tablet Take 5 mg by mouth daily.     . metFORMIN (GLUCOPHAGE) 1000 MG tablet Take 1,000 mg by mouth 2 (two) times daily with a meal.    . methocarbamol (ROBAXIN) 500 MG tablet Take 1 tablet (500 mg total) by mouth every 6 (six) hours as needed for muscle spasms (or pain). 20 tablet 0  . oxyCODONE-acetaminophen (PERCOCET) 7.5-325 MG per tablet Take 1-2 tablets by mouth every 4 (four) hours as needed for pain. 20 tablet 0  . pravastatin (PRAVACHOL) 40 MG tablet Take 40 mg by mouth daily.    . nitroGLYCERIN (NITROSTAT) 0.4 MG SL tablet Place 0.4 mg under the tongue every 5 (five) minutes as needed for chest pain.    . traMADol (ULTRAM) 50 MG tablet Take 50 mg by mouth every 6 (six) hours as needed for moderate pain.      Psychiatric Specialty Exam:     Blood pressure 133/85, pulse 87, temperature 98.1 F (36.7 C), temperature source Oral, resp. rate 16, height _0  (1.702 m), weight 79.833 kg (176 lb), SpO2 100 %.Body mass index is 27.56 kg/(m^2).  General Appearance: Disheveled  Eye Contact::   Fair  Speech:  Normal Rate  Volume:  Normal  Mood:  Depressed  Affect:  Congruent  Thought Process:  Coherent  Orientation:  Full (Time, Place, and Person)  Thought Content:  Hallucinations: Auditory Visual  Suicidal Thoughts:  Yes.  with intent/plan  Homicidal Thoughts:  No  Memory:  Immediate;   Fair Recent;   Fair Remote;   Fair  Judgement:  Poor  Insight:  Fair  Psychomotor Activity:  Decreased  Concentration:  Fair  Recall:  [place]: Fair  Akathisia:  No  Handed:  Right  AIMS (if indicated):     Assets:  Leisure Time Resilience  Sleep:      Musculoskeletal: Strength & Muscle Tone: within normal limits Gait & Station: normal Patient leans: N/A  Treatment Plan Summary: Daily contact with patient to assess and evaluate symptoms and progress in treatment Medication management; restart home medications, admit to gero-psychiatry for stabilization.  Will continue with current treatment plan for inpatient treatment gero psych Consult Wound Care and Pediatry related to left food Consult Nutrition patient in ED greater that 24 hours.   Earleen Newport, FNP-BC 08/17/2014 1:13 PM    Patient seen, evaluated and I agree with notes by Nurse Practitioner. Corena Pilgrim, MD

## 2014-08-17 NOTE — Consult Note (Signed)
WOC wound consult note Reason for Consult: blistered and open areas in the interdigital spaces on the left foot, digits 3-5. Patient with DM. Wound type:moisture associated skin damage (MASD), suspect fungal overgrowth (tinea pedis) Pressure Ulcer POA: No Measurement: 2cm x 4cm x 0.2cm Wound bed: Macerated.  Raised fluid filled blister present. Drainage (amount, consistency, odor) scant serous Periwound: Macerated. Dressing procedure/placement/frequency: I will implement a daily hygiene program using our minimal ingredient skin cleanser to reduce the likelihood of sensitivity.  Following this we will weave an antimicrobial textile between the toes to kill fungus and absorb moisture. If MD would like to add a one time dose of an oral antifungal (such as Diflucan), this might expedite the resolution of the presentation.  I see that podiatry has also been consulted; I will defer to their orders should they provide an alternate POC. [facility] nursing team will not follow, but will remain available to this patient, the nursing and medical team.  Please re-consult if needed. Thanks, Maudie Flakes, MSN, RN, [place], [place], [place] [phone])

## 2014-08-17 NOTE — ED Notes (Addendum)
Wound care provided to left foot. Blisters noted to bottom of left foot. Blisters intact and no drainage noted. No redness or swelling noted. Cleansed and dressed per wound care order. Tolerated well and offered no questions or concerns. Denied pain.

## 2014-08-18 ENCOUNTER — Emergency Department (HOSPITAL_COMMUNITY)
Admission: EM | Admit: 2014-08-18 | Discharge: 2014-08-19 | Disposition: A | Discharge status: Home / Self Care | Payer: Medicare Other | Attending: Emergency Medicine | Admitting: Emergency Medicine

## 2014-08-18 ENCOUNTER — Encounter (HOSPITAL_COMMUNITY): Payer: Self-pay | Admitting: Emergency Medicine

## 2014-08-18 ENCOUNTER — Encounter (HOSPITAL_COMMUNITY): Payer: Self-pay | Admitting: Psychiatry

## 2014-08-18 DIAGNOSIS — L988 Other specified disorders of the skin and subcutaneous tissue: Secondary | ICD-10-CM | POA: Diagnosis present

## 2014-08-18 DIAGNOSIS — M109 Gout, unspecified: Secondary | ICD-10-CM | POA: Insufficient documentation

## 2014-08-18 DIAGNOSIS — S90822D Blister (nonthermal), left foot, subsequent encounter: Secondary | ICD-10-CM

## 2014-08-18 DIAGNOSIS — I2510 Atherosclerotic heart disease of native coronary artery without angina pectoris: Secondary | ICD-10-CM | POA: Diagnosis not present

## 2014-08-18 DIAGNOSIS — Z7982 Long term (current) use of aspirin: Secondary | ICD-10-CM | POA: Insufficient documentation

## 2014-08-18 DIAGNOSIS — F329 Major depressive disorder, single episode, unspecified: Secondary | ICD-10-CM | POA: Diagnosis not present

## 2014-08-18 DIAGNOSIS — M359 Systemic involvement of connective tissue, unspecified: Secondary | ICD-10-CM | POA: Diagnosis not present

## 2014-08-18 DIAGNOSIS — J45909 Unspecified asthma, uncomplicated: Secondary | ICD-10-CM | POA: Diagnosis not present

## 2014-08-18 DIAGNOSIS — Z794 Long term (current) use of insulin: Secondary | ICD-10-CM | POA: Insufficient documentation

## 2014-08-18 DIAGNOSIS — I10 Essential (primary) hypertension: Secondary | ICD-10-CM | POA: Insufficient documentation

## 2014-08-18 DIAGNOSIS — E119 Type 2 diabetes mellitus without complications: Secondary | ICD-10-CM | POA: Diagnosis not present

## 2014-08-18 DIAGNOSIS — L089 Local infection of the skin and subcutaneous tissue, unspecified: Secondary | ICD-10-CM

## 2014-08-18 DIAGNOSIS — Z21 Asymptomatic human immunodeficiency virus [HIV] infection status: Secondary | ICD-10-CM | POA: Diagnosis not present

## 2014-08-18 DIAGNOSIS — Z79899 Other long term (current) drug therapy: Secondary | ICD-10-CM | POA: Diagnosis not present

## 2014-08-18 DIAGNOSIS — M795 Residual foreign body in soft tissue: Secondary | ICD-10-CM

## 2014-08-18 DIAGNOSIS — Z7902 Long term (current) use of antithrombotics/antiplatelets: Secondary | ICD-10-CM | POA: Diagnosis not present

## 2014-08-18 DIAGNOSIS — E785 Hyperlipidemia, unspecified: Secondary | ICD-10-CM | POA: Insufficient documentation

## 2014-08-18 DIAGNOSIS — R45851 Suicidal ideations: Secondary | ICD-10-CM

## 2014-08-18 LAB — CBG MONITORING, ED
Glucose-Capillary: 89 mg/dL (ref 70–99)
Glucose-Capillary: 160 mg/dL — ABNORMAL HIGH (ref 70–99)

## 2014-08-18 MED ORDER — INSULIN GLARGINE 100 UNIT/ML SOLOSTAR PEN: 30.0000 [IU] | PEN_INJECTOR | Freq: Every day | SUBCUTANEOUS | Status: DC

## 2014-08-18 MED ORDER — INSULIN ASPART 100 UNIT/ML ~~LOC~~ SOLN: 4.0000 [IU] | Freq: Three times a day (TID) | SUBCUTANEOUS | Status: DC

## 2014-08-18 MED ORDER — ASPIRIN EC 81 MG PO TBEC: 81.0000 mg | DELAYED_RELEASE_TABLET | Freq: Every day | ORAL | Status: DC

## 2014-08-18 MED ORDER — ALLOPURINOL 100 MG PO TABS: 200.0000 mg | ORAL_TABLET | Freq: Every day | ORAL | Status: DC

## 2014-08-18 MED ORDER — NITROGLYCERIN 0.4 MG SL SUBL: 0.4000 mg | SUBLINGUAL_TABLET | SUBLINGUAL | Status: DC | PRN

## 2014-08-18 MED ORDER — OXYCODONE-ACETAMINOPHEN 7.5-325 MG PO TABS: 1.0000 | ORAL_TABLET | ORAL | Status: DC | PRN

## 2014-08-18 MED ORDER — DICLOFENAC SODIUM 1 % TD GEL: 2.0000 g | Freq: Every day | TRANSDERMAL | Status: DC | PRN

## 2014-08-18 MED ORDER — METHOCARBAMOL 500 MG PO TABS: 500.0000 mg | ORAL_TABLET | Freq: Four times a day (QID) | ORAL | Status: DC | PRN

## 2014-08-18 MED ORDER — LISINOPRIL 10 MG PO TABS: 5.0000 mg | ORAL_TABLET | Freq: Every day | ORAL | Status: DC

## 2014-08-18 MED ORDER — METFORMIN HCL 1000 MG PO TABS: 1000.0000 mg | ORAL_TABLET | Freq: Two times a day (BID) | ORAL | Status: DC

## 2014-08-18 MED ORDER — IBUPROFEN 600 MG PO TABS: 600.0000 mg | ORAL_TABLET | Freq: Four times a day (QID) | ORAL | Status: DC | PRN

## 2014-08-18 MED ORDER — GABAPENTIN 300 MG PO CAPS: 300.0000 mg | ORAL_CAPSULE | Freq: Three times a day (TID) | ORAL | Status: DC

## 2014-08-18 MED ORDER — EMTRICITAB-RILPIVIR-TENOFOV DF 200-25-300 MG PO TABS: 1.0000 | ORAL_TABLET | Freq: Every day | ORAL | Status: DC

## 2014-08-18 MED ORDER — FAMOTIDINE 20 MG PO TABS: 20.0000 mg | ORAL_TABLET | Freq: Every day | ORAL | Status: DC | PRN

## 2014-08-18 MED ORDER — IPRATROPIUM-ALBUTEROL 0.5-2.5 (3) MG/3ML IN SOLN
3.0000 mL | RESPIRATORY_TRACT | Status: DC | PRN
Start: 2014-08-18 — End: 2014-12-20

## 2014-08-18 MED ORDER — PRAVASTATIN SODIUM 40 MG PO TABS: 40.0000 mg | ORAL_TABLET | Freq: Every day | ORAL | Status: DC

## 2014-08-18 MED ORDER — ISOSORBIDE MONONITRATE ER 30 MG PO TB24: 30.0000 mg | ORAL_TABLET | Freq: Every day | ORAL | Status: DC

## 2014-08-18 MED ORDER — CLOPIDOGREL BISULFATE 75 MG PO TABS: 75.0000 mg | ORAL_TABLET | Freq: Every day | ORAL | Status: DC

## 2014-08-18 MED ORDER — DICLOFENAC SODIUM 25 MG PO TBEC: 25.0000 mg | DELAYED_RELEASE_TABLET | Freq: Three times a day (TID) | ORAL | Status: DC | PRN

## 2014-08-18 MED ORDER — FENTANYL 12 MCG/HR TD PT72: 12.5000 ug | MEDICATED_PATCH | TRANSDERMAL | Status: DC

## 2014-08-18 MED ORDER — DILTIAZEM HCL ER 120 MG PO CP24: 120.0000 mg | ORAL_CAPSULE | Freq: Every day | ORAL | Status: DC

## 2014-08-18 MED ORDER — TRAMADOL HCL 50 MG PO TABS: 50.0000 mg | ORAL_TABLET | Freq: Four times a day (QID) | ORAL | Status: DC | PRN

## 2014-08-18 MED ORDER — DULOXETINE HCL 60 MG PO CPEP: 60.0000 mg | ORAL_CAPSULE | Freq: Every day | ORAL | Status: DC

## 2014-08-18 NOTE — ED Provider Notes (Addendum)
CSN: [id number]     Arrival date & time 08/18/14  2111 History   First MD Initiated Contact with Patient 08/18/14 2257     Chief Complaint  Patient presents with  . Blister     (Consider location/radiation/quality/duration/timing/severity/associated sxs/prior Treatment) HPI Michael [name] is a 63 y.o. male with past medical history of hypertension, diabetes, hyperlipidemia, HIV, coronary artery disease presenting today with right foot pain and swelling. Patient states this has been going on for the past week. He does have numbness and tingling in that foot from his diabetes, he denies stepping on any foreign bodies. He denies ever having these blisters in the past. He describes a blister to the base of his right foot under the second through fourth toes. He has not been taking any pain medication during the interval. He denies any fevers, chills or recent infections. He has no pain proximally in the leg. Patient has no further complaints.   10 Systems reviewed and are negative for acute change except as noted in the HPI.    Past Medical History  Diagnosis Date  . Diabetes mellitus     Diagnosed in 2002, started insulin in 2012  . Hypertension   . Headache(784.0)     CT head 08/2011: Periventricular and subcortical white matter hypodensities are most in keeping with chronic microangiopathic change  . Gout   . Hyperlipidemia   . HIV infection Nov 2012    Followed by [name]  . Crack cocaine use     for 20+ years, has been enrolled in detox programs in the past  . Chondromalacia of medial femoral condyle     Left knee MRI 04/28/12: Chondromalacia of the medial femoral condyle with slight peripheral degeneration of the meniscocapsular junction of the medial meniscus; followed by sports medicine  . Asthma     No PFTs, history of childhood asthma  . Depression     with history of hospitalization for suicidal ideation  . CAD (coronary artery disease)     a. 06/2013 STEMI/PCI ([facility]):  LAD w/ thrombus (treated with BMS), mid 75%, D2 75%; LCX OM2 75%; RCA small, PDA 95%, PLV 95%;  b. 10/2013 Cath/PCI: ISR w/in LAD (Promus DES x 2), borderline OM2 lesion;  c. 01/2014 MV: Intermediate risk, medium-sized distal ant wall infarct w/ very small amt of peri-infarct ischemia. EF 60%.  . Gout 04/28/2012  . Collagen vascular disease   . Cellulitis 04/2014    left facial   Past Surgical History  Procedure Laterality Date  . Prostate surgery    . Bowel resection    . Back surgery      1988  . Cardiac surgery    . Cervical spine surgery      " rods in my neck "  . Coronary stent placement    . Nm myocar perf wall motion  12/27/2011    normal   Family History  Problem Relation Age of Onset  . Diabetes Mother   . Diabetes Father   . Diabetes Brother   . Colon cancer Neg Hx    History  Substance Use Topics  . Smoking status: Never Smoker   . Smokeless tobacco: Never Used  . Alcohol Use: No    Review of Systems    Allergies  Review of patient's allergies indicates no known allergies.  Home Medications   Prior to Admission medications   Medication Sig Start Date End Date Taking? Authorizing Provider  allopurinol (ZYLOPRIM) 100 MG tablet Take  2 tablets (200 mg total) by mouth daily. 08/18/14  Yes Waylan Boga, NP  aspirin EC 81 MG tablet Take 1 tablet (81 mg total) by mouth daily. 08/18/14  Yes Waylan Boga, NP  clopidogrel (PLAVIX) 75 MG tablet Take 1 tablet (75 mg total) by mouth daily. 08/18/14  Yes Waylan Boga, NP  diclofenac (VOLTAREN) 25 MG EC tablet Take 1 tablet (25 mg total) by mouth 3 (three) times daily as needed for mild pain. 08/18/14  Yes Waylan Boga, NP  diclofenac sodium (VOLTAREN) 1 % GEL Apply 2 g topically daily as needed (pain). 08/18/14  Yes Waylan Boga, NP  diltiazem (DILACOR XR) 120 MG 24 hr capsule Take 1 capsule (120 mg total) by mouth daily. 08/18/14  Yes Waylan Boga, NP  DULoxetine (CYMBALTA) 60 MG capsule Take 1 capsule (60 mg total) by  mouth daily. 08/18/14  Yes Waylan Boga, NP  Emtricitab-Rilpivir-Tenofovir (COMPLERA) 200-25-300 MG TABS Take 1 tablet by mouth daily. 08/18/14  Yes Waylan Boga, NP  famotidine (PEPCID) 20 MG tablet Take 1 tablet (20 mg total) by mouth daily as needed for heartburn. 08/18/14  Yes Waylan Boga, NP  fentaNYL (DURAGESIC) 12 MCG/HR Place 1 patch (12.5 mcg total) onto the skin every 3 (three) days. 08/18/14  Yes Waylan Boga, NP  gabapentin (NEURONTIN) 300 MG capsule Take 1 capsule (300 mg total) by mouth 3 (three) times daily. 08/18/14  Yes Waylan Boga, NP  ibuprofen (ADVIL,MOTRIN) 600 MG tablet Take 1 tablet (600 mg total) by mouth every 6 (six) hours as needed. Patient taking differently: Take 600 mg by mouth every 6 (six) hours as needed for moderate pain.  08/18/14  Yes Waylan Boga, NP  insulin aspart (NOVOLOG) 100 UNIT/ML injection Inject 4 Units into the skin 3 (three) times daily before meals. 08/18/14  Yes Waylan Boga, NP  Insulin Glargine (LANTUS) 100 UNIT/ML Solostar Pen Inject 30 Units into the skin at bedtime. 08/18/14  Yes Waylan Boga, NP  ipratropium-albuterol (DUONEB) 0.5-2.5 (3) MG/3ML SOLN Take 3 mLs by nebulization every 4 (four) hours as needed (shortness of breath). 08/18/14  Yes Waylan Boga, NP  isosorbide mononitrate (IMDUR) 30 MG 24 hr tablet Take 1 tablet (30 mg total) by mouth daily. 08/18/14  Yes Waylan Boga, NP  lisinopril (PRINIVIL,ZESTRIL) 10 MG tablet Take 0.5 tablets (5 mg total) by mouth daily. 08/18/14  Yes Waylan Boga, NP  metFORMIN (GLUCOPHAGE) 1000 MG tablet Take 1 tablet (1,000 mg total) by mouth 2 (two) times daily with a meal. 08/18/14  Yes Waylan Boga, NP  methocarbamol (ROBAXIN) 500 MG tablet Take 1 tablet (500 mg total) by mouth every 6 (six) hours as needed for muscle spasms (or pain). 08/18/14  Yes Waylan Boga, NP  nitroGLYCERIN (NITROSTAT) 0.4 MG SL tablet Place 1 tablet (0.4 mg total) under the tongue every 5 (five) minutes as needed for chest pain.  08/18/14  Yes Waylan Boga, NP  oxyCODONE-acetaminophen (PERCOCET) 7.5-325 MG per tablet Take 1-2 tablets by mouth every 4 (four) hours as needed for pain. 08/18/14  Yes Waylan Boga, NP  pravastatin (PRAVACHOL) 40 MG tablet Take 1 tablet (40 mg total) by mouth daily. 08/18/14  Yes Waylan Boga, NP  traMADol (ULTRAM) 50 MG tablet Take 1 tablet (50 mg total) by mouth every 6 (six) hours as needed for moderate pain. 08/18/14  Yes Waylan Boga, NP   BP 134/75 mmHg  Pulse 87  Temp(Src) 98.2 F (36.8 C) (Oral)  Resp 16  SpO2 100% Physical Exam  Constitutional: He is  oriented to person, place, and time. Vital signs are normal. He appears well-developed and well-nourished.  Non-toxic appearance. He does not appear ill. No distress.  HENT:  Head: Normocephalic and atraumatic.  Nose: Nose normal.  Mouth/Throat: Oropharynx is clear and moist. No oropharyngeal exudate.  Eyes: Conjunctivae and EOM are normal. Pupils are equal, round, and reactive to light. No scleral icterus.  Neck: Normal range of motion. Neck supple. No tracheal deviation, no edema, no erythema and normal range of motion present. No thyroid mass and no thyromegaly present.  Cardiovascular: Normal rate, regular rhythm, S1 normal, S2 normal, normal heart sounds, intact distal pulses and normal pulses.  Exam reveals no gallop and no friction rub.   No murmur heard. Pulses:      Radial pulses are 2+ on the right side, and 2+ on the left side.       Dorsalis pedis pulses are 2+ on the right side, and 2+ on the left side.  Pulmonary/Chest: Effort normal and breath sounds normal. No respiratory distress. He has no wheezes. He has no rhonchi. He has no rales.  Abdominal: Soft. Normal appearance and bowel sounds are normal. He exhibits no distension, no ascites and no mass. There is no hepatosplenomegaly. There is no tenderness. There is no rebound, no guarding and no CVA tenderness.  Musculoskeletal: Normal range of motion. He exhibits  tenderness. He exhibits no edema.  Right foot with 4 cm blister and 2 cm area of fluctuance in the middle. It is located at the distal end of the second through fourth metatarsals. It is tender to palpation. There is no erythema. There is mild warmth associated. No foreign body seen.  Lymphadenopathy:    He has no cervical adenopathy.  Neurological: He is alert and oriented to person, place, and time. He has normal strength. No cranial nerve deficit or sensory deficit. GCS eye subscore is 4. GCS verbal subscore is 5. GCS motor subscore is 6.  Skin: Skin is warm, dry and intact. No petechiae and no rash noted. He is not diaphoretic. No erythema. No pallor.  Psychiatric: He has a normal mood and affect. His behavior is normal. Judgment normal.  Nursing note and vitals reviewed.   ED Course  Procedures (including critical care time) Labs Review Labs Reviewed - No data to display  Imaging Review Dg Foot Complete Left  08/19/2014   CLINICAL DATA:  Left foot soreness last 2 days. Plantar blister from 2nd to 5th toe at the level of the MTP joints.  EXAM: LEFT FOOT - COMPLETE 3+ VIEW  COMPARISON:  None.  FINDINGS: Mild degenerative change of the first MTP joint. No evidence of fracture dislocation no significant soft tissue abnormality.  IMPRESSION: No acute findings.   Electronically Signed   By: Marin Olp M.D.   On: 08/19/2014 00:36     EKG Interpretation None      MDM   Final diagnoses:  Foreign body (FB) in soft tissue    Patient presents emergency department for new blister and foot. Area is warm, he does have history of diabetes with numbness and tingling. Will obtain x-ray to evaluate for foreign body. We'll send home a short course of antibiotics for coverage as the area is warm and tender.  X-ray was negative for foreign body.  Ultimately, the patient will need to follow-up with podiatry for treatment.  Vital signs remain within his normal limits and is safe for  discharge.    Everlene Balls, MD 08/19/14 [phone]  Everlene Balls, MD 08/19/14 0045  Patient diagnosis IS NOT A FOREIGN BODY, HIS XRAYS WERE NEGATIVE.  That diagnosis was placed in the chart as the reason for the xray evaluation.  I am unable to remove diagnosis from chart.  Patient diagnosis is only a large blister without rupture and mild cellulitis.    Everlene Balls, MD 08/24/14 [phone]

## 2014-08-18 NOTE — ED Notes (Signed)
Dr. Oni at bedside. 

## 2014-08-18 NOTE — ED Notes (Signed)
Wound care provided to left foot. Blisters noted to bottom of left foot. Blisters intact and no drainage noted. No redness or swelling noted. Blister appears unchanged since yesterday, but peri-wound area appears less moist . Cleansed and dressed per wound care order. Tolerated well and offered no questions or concerns. Denied pain.

## 2014-08-18 NOTE — ED Notes (Signed)
Pt. reports blister at bottom of left foot onset 1 week ago , painful when walking , denies injury .

## 2014-08-18 NOTE — BH Assessment (Signed)
Discharge per Dr. Darleene Cleaver and Waylan Boga, NP with follow up referrals to homeless shelters. LCSW-Kristen made aware and will assist with shelter referrals.

## 2014-08-18 NOTE — [facility] Suicide Risk Assessment (Signed)
Suicide Risk Assessment  Discharge Assessment     Demographic Factors:  Male, Living alone and Unemployed  Total Time spent with patient: 30 minutes    Psychiatric Specialty Exam:     Blood pressure 106/72, pulse 98, temperature 98.4 F (36.9 C), temperature source Oral, resp. rate 15, height 5\' 7"  (1.702 m), weight 176 lb (79.833 kg), SpO2 100 %.Body mass index is 27.56 kg/(m^2).  General Appearance: Disheveled  Eye Sport and exercise psychologist::  Fair  Speech:  Normal Rate  Volume:  Normal  Mood:  Depressed  Affect:  Congruent  Thought Process:  Coherent  Orientation:  Full (Time, Place, and Person)  Thought Content:  Hallucinations: Auditory Visual  Suicidal Thoughts: deneis  Homicidal Thoughts:  No  Memory:  Immediate;   Fair Recent;   Fair Remote;   Fair  Judgement:  Poor  Insight:  Fair  Psychomotor Activity:  Decreased  Concentration:  Fair  Recall:  Abie: Fair  Akathisia:  No  Handed:  Right  AIMS (if indicated):     Assets:  Leisure Time Resilience  Sleep:      Musculoskeletal: Strength & Muscle Tone: within normal limits Gait & Station: normal Patient leans: N/A   Mental Status Per Nursing Assessment::   On Admission:   suicidal ideation with command hallucinations to overdose.  Current Mental Status by Physician: denies current suicidal ideation and no longer experiencing command hallucinations.  states that auditory hallucinations are lessened and are non threatening in nature  Loss Factors: Financial problems/change in socioeconomic status  Historical Factors: NA  Risk Reduction Factors:   Positive therapeutic relationship and Positive coping skills or problem solving skills  Continued Clinical Symptoms:  Currently Psychotic  Cognitive Features That Contribute To Risk:  NA  Patient logical and goal directed in conversation,  No evidence of delusions  Suicide Risk:  Minimal: No identifiable suicidal ideation.  Patients  presenting with no risk factors but with morbid ruminations; may be classified as minimal risk based on the severity of the depressive symptoms  Discharge Diagnoses:   AXIS I:  major depressive disorder recurrent severe with psychotic features  AXIS II:  No diagnosis AXIS III:   Past Medical History  Diagnosis Date  . Diabetes mellitus     Diagnosed in 2002, started insulin in 2012  . Hypertension   . Headache(784.0)     CT head 08/2011: Periventricular and subcortical white matter hypodensities are most in keeping with chronic microangiopathic change  . Gout   . Hyperlipidemia   . HIV infection Nov 2012    Followed by [name]  . Crack cocaine use     for 20+ years, has been enrolled in detox programs in the past  . Chondromalacia of medial femoral condyle     Left knee MRI 04/28/12: Chondromalacia of the medial femoral condyle with slight peripheral degeneration of the meniscocapsular junction of the medial meniscus; followed by sports medicine  . Asthma     No PFTs, history of childhood asthma  . Depression     with history of hospitalization for suicidal ideation  . CAD (coronary artery disease)     a. 06/2013 STEMI/PCI ([facility]): LAD w/ thrombus (treated with BMS), mid 75%, D2 75%; LCX OM2 75%; RCA small, PDA 95%, PLV 95%;  b. 10/2013 Cath/PCI: ISR w/in LAD (Promus DES x 2), borderline OM2 lesion;  c. 01/2014 MV: Intermediate risk, medium-sized distal ant wall infarct w/ very small amt of peri-infarct ischemia. EF  60%.  . Gout 04/28/2012  . Collagen vascular disease   . Cellulitis 04/2014    left facial   AXIS IV:  economic problems and housing problems AXIS V:  61-70 mild symptoms  Plan Of Care/Follow-up recommendations:  Activity:  as tolerated Diet:  heart healthy well balanced meals   Is patient on multiple antipsychotic therapies at discharge:  No   Has Patient had three or more failed trials of antipsychotic monotherapy by history:  No  Recommended Plan for Multiple  Antipsychotic Therapies: NA    Annalis Kaczmarczyk  PMH-NP-BC 08/18/2014, 10:46 AM

## 2014-08-18 NOTE — BH Assessment (Signed)
Nursing staff stated that patient is up for discharge. This Probation officer went ahead and provided a list of community homeless shelters.

## 2014-08-18 NOTE — Consult Note (Signed)
[facility] Consult   Reason for Consult:  Suicidal ideations Referring Physician:  EDP  Michael [name] is an 63 y.o. male. Total Time spent with patient: 30 minutes     Subjective:  Patient presented to the ED with suicidal ideations.  States he has been hearing voices  telling him to kill himself.   HPI:  Patient is a 63 year old African American male patient who is currently homeless/ lives in a hotel.  Patient relates that he had a difficult time staying asleep and awakened early at 3 am. Patient describes mood as less depressed, and denies current suicidal ideation/homicidal ideation.  Patient is coherent in conversation with no evidence of delusions.  Patient does endorse a decrease in auditory hallucinations and denies the presence of command hallucinations.  Patient would like to eventually  find an assisted living arrangement as he "feels better when he is around people"  Patient is amendable to shelter placement at this time discussed long range plan with Education officer, museum.   Axis 5:  Major depression recurrent severe with psychotic features.  Assessment: AXIS I:  Severe major depression with psychotic features; cocaine abuse AXIS II:  Deferred AXIS III:   Past Medical History  Diagnosis Date  . Diabetes mellitus     Diagnosed in 2002, started insulin in 2012  . Hypertension   . Headache(784.0)     CT head 08/2011: Periventricular and subcortical white matter hypodensities are most in keeping with chronic microangiopathic change  . Gout   . Hyperlipidemia   . HIV infection Nov 2012    Followed by [name]  . Crack cocaine use     for 20+ years, has been enrolled in detox programs in the past  . Chondromalacia of medial femoral condyle     Left knee MRI 04/28/12: Chondromalacia of the medial femoral condyle with slight peripheral degeneration of the meniscocapsular junction of the medial meniscus; followed by sports medicine  . Asthma     No PFTs,  history of childhood asthma  . Depression     with history of hospitalization for suicidal ideation  . CAD (coronary artery disease)     a. 06/2013 STEMI/PCI ([facility]): LAD w/ thrombus (treated with BMS), mid 75%, D2 75%; LCX OM2 75%; RCA small, PDA 95%, PLV 95%;  b. 10/2013 Cath/PCI: ISR w/in LAD (Promus DES x 2), borderline OM2 lesion;  c. 01/2014 MV: Intermediate risk, medium-sized distal ant wall infarct w/ very small amt of peri-infarct ischemia. EF 60%.  . Gout 04/28/2012  . Collagen vascular disease   . Cellulitis 04/2014    left facial   AXIS IV:  economic problems, housing problems, other psychosocial or environmental problems, problems related to social environment and problems with primary support group AXIS V:  61-70 mild symptoms  Discharge to shelter with outpatient follow up for depression with psychotic features.     Past Psychiatric History: Past Medical History  Diagnosis Date  . Diabetes mellitus     Diagnosed in 2002, started insulin in 2012  . Hypertension   . Headache(784.0)     CT head 08/2011: Periventricular and subcortical white matter hypodensities are most in keeping with chronic microangiopathic change  . Gout   . Hyperlipidemia   . HIV infection Nov 2012    Followed by [name]  . Crack cocaine use     for 20+ years, has been enrolled in detox programs in the past  . Chondromalacia of medial femoral condyle  Left knee MRI 04/28/12: Chondromalacia of the medial femoral condyle with slight peripheral degeneration of the meniscocapsular junction of the medial meniscus; followed by sports medicine  . Asthma     No PFTs, history of childhood asthma  . Depression     with history of hospitalization for suicidal ideation  . CAD (coronary artery disease)     a. 06/2013 STEMI/PCI ([facility]): LAD w/ thrombus (treated with BMS), mid 75%, D2 75%; LCX OM2 75%; RCA small, PDA 95%, PLV 95%;  b. 10/2013 Cath/PCI: ISR w/in LAD (Promus DES x 2), borderline OM2 lesion;  c.  01/2014 MV: Intermediate risk, medium-sized distal ant wall infarct w/ very small amt of peri-infarct ischemia. EF 60%.  . Gout 04/28/2012  . Collagen vascular disease   . Cellulitis 04/2014    left facial    reports that he has never smoked. He has never used smokeless tobacco. He reports that he uses illicit drugs ("Crack" cocaine and Cocaine). He reports that he does not drink alcohol. Family History  Problem Relation Age of Onset  . Diabetes Mother   . Diabetes Father   . Diabetes Brother   . Colon cancer Neg Hx    Family History Substance Abuse: No Family Supports: Yes, List: (Sister ) Living Arrangements: Other relatives (Sister) Can pt return to current living arrangement?: Yes Abuse/Neglect [facility]) Physical Abuse: Denies Verbal Abuse: Denies Sexual Abuse: Denies Allergies:  No Known Allergies  ACT Assessment Complete:  Yes:    Educational Status    Risk to Self: Risk to self with the past 6 months Suicidal Ideation: Yes-Currently Present Suicidal Intent: Yes-Currently Present Is patient at risk for suicide?: Yes Suicidal Plan?: Yes-Currently Present Specify Current Suicidal Plan: "The voices are telling me to take pills".  Access to Means: Yes Specify Access to Suicidal Means: Pt has access to pills.  What has been your use of drugs/alcohol within the last 12 months?: Pt did not report any alcohol or drug use but UDS is positive for cocaine.  Previous Attempts/Gestures: Yes How many times?: 1 Other Self Harm Risks: No other self harm risk identified at this time.  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) Persecutory voices/beliefs?: Yes Depression: Yes Depression Symptoms: Despondent, Insomnia, Tearfulness, Isolating, Fatigue, Loss of interest in usual pleasures, Guilt, Feeling worthless/self pity, Feeling angry/irritable Substance abuse  history and/or treatment for substance abuse?: Yes Suicide prevention information given to non-admitted patients: Not applicable  Risk to Others: Risk to Others within the past 6 months Homicidal Ideation: No Thoughts of Harm to Others: No Current Homicidal Intent: No Current Homicidal Plan: No Access to Homicidal Means: No Identified Victim: NA History of harm to others?: No Assessment of Violence: None Noted Violent Behavior Description: No violent behaviors observed. Pt is calm and cooperative at this time.  Does patient have access to weapons?: No Criminal Charges Pending?: No Does patient have a court date: No  Abuse: Abuse/Neglect Assessment (Assessment to be complete while patient is alone) Physical Abuse: Denies Verbal Abuse: Denies Sexual Abuse: Denies Exploitation of patient/patient's resources: Denies Self-Neglect: Denies  Prior Inpatient Therapy: Prior Inpatient Therapy Prior Inpatient Therapy: Yes Prior Therapy Dates: 2012 Prior Therapy Facilty/Provider(s): [facility] Reason for Treatment: SA  Prior Outpatient Therapy: Prior Outpatient Therapy Prior Outpatient Therapy: No  Additional Information: Additional Information 1:1 In Past 12 Months?: No CIRT Risk: No Elopement Risk: No      Objective: Blood  pressure 106/72, pulse 98, temperature 98.4 F (36.9 C), temperature source Oral, resp. rate 15, height '5\' 7"'  (1.702 m), weight 176 lb (79.833 kg), SpO2 100 %.Body mass index is 27.56 kg/(m^2). Results for orders placed or performed during the hospital encounter of 08/13/14 (from the past 72 hour(s))  Basic metabolic panel     Status: Abnormal   Collection Time: 08/15/14 12:23 PM  Result Value Ref Range   Sodium 137 137 - 147 mEq/L   Potassium 4.1 3.7 - 5.3 mEq/L   Chloride 101 96 - 112 mEq/L   CO2 24 19 - 32 mEq/L   Glucose, Bld 283 (H) 70 - 99 mg/dL   BUN 18 6 - 23 mg/dL   Creatinine, Ser 1.22 0.50 - 1.35 mg/dL   Calcium 9.5 8.4 - 10.5 mg/dL   GFR calc non Af  Amer 61 (L) >90 mL/min   GFR calc Af Amer 71 (L) >90 mL/min    Comment: (NOTE) The eGFR has been calculated using the CKD EPI equation. This calculation has not been validated in all clinical situations. eGFR's persistently <90 mL/min signify possible Chronic Kidney Disease.    Anion gap 12 5 - 15  CBG monitoring, ED     Status: Abnormal   Collection Time: 08/15/14 12:37 PM  Result Value Ref Range   Glucose-Capillary 270 (H) 70 - 99 mg/dL  CBG monitoring, ED     Status: Abnormal   Collection Time: 08/15/14  1:17 PM  Result Value Ref Range   Glucose-Capillary 246 (H) 70 - 99 mg/dL  CBG monitoring, ED     Status: Abnormal   Collection Time: 08/15/14  5:34 PM  Result Value Ref Range   Glucose-Capillary 207 (H) 70 - 99 mg/dL   Comment 1 Notify RN    Comment 2 Documented in Chart   POC CBG, ED     Status: Abnormal   Collection Time: 08/15/14  9:30 PM  Result Value Ref Range   Glucose-Capillary 239 (H) 70 - 99 mg/dL  POC CBG, ED     Status: Abnormal   Collection Time: 08/16/14  8:12 AM  Result Value Ref Range   Glucose-Capillary 191 (H) 70 - 99 mg/dL  POC CBG, ED     Status: Abnormal   Collection Time: 08/16/14 12:57 PM  Result Value Ref Range   Glucose-Capillary 138 (H) 70 - 99 mg/dL   Comment 1 Notify RN   POC CBG, ED     Status: None   Collection Time: 08/16/14  5:41 PM  Result Value Ref Range   Glucose-Capillary 90 70 - 99 mg/dL  POC CBG, ED     Status: Abnormal   Collection Time: 08/16/14 11:27 PM  Result Value Ref Range   Glucose-Capillary 255 (H) 70 - 99 mg/dL   Comment 1 Notify RN    Comment 2 Documented in Chart   POC CBG, ED     Status: None   Collection Time: 08/17/14  7:54 AM  Result Value Ref Range   Glucose-Capillary 93 70 - 99 mg/dL  POC CBG, ED     Status: None   Collection Time: 08/17/14 12:47 PM  Result Value Ref Range   Glucose-Capillary 85 70 - 99 mg/dL  POC CBG, ED     Status: Abnormal   Collection Time: 08/17/14  5:56 PM  Result Value Ref Range    Glucose-Capillary 104 (H) 70 - 99 mg/dL  CBG monitoring, ED     Status: Abnormal   Collection  Time: 08/17/14  8:48 PM  Result Value Ref Range   Glucose-Capillary 158 (H) 70 - 99 mg/dL  POC CBG, ED     Status: None   Collection Time: 08/18/14  8:30 AM  Result Value Ref Range   Glucose-Capillary 89 70 - 99 mg/dL   Labs are reviewed and are pertinent for medical issues being addressed.  Current Facility-Administered Medications  Medication Dose Route Frequency Provider Last Rate Last Dose  . acetaminophen (TYLENOL) tablet 650 mg  650 mg Oral Q4H PRN Noland Fordyce, PA-C   650 mg at 08/17/14 0301  . allopurinol (ZYLOPRIM) tablet 200 mg  200 mg Oral Daily Evelina Bucy, MD   200 mg at 08/18/14 0934  . alum & mag hydroxide-simeth (MAALOX/MYLANTA) 200-200-20 MG/5ML suspension 30 mL  30 mL Oral PRN Noland Fordyce, PA-C      . aspirin EC tablet 81 mg  81 mg Oral Daily Evelina Bucy, MD   81 mg at 08/18/14 0934  . clopidogrel (PLAVIX) tablet 75 mg  75 mg Oral Daily Evelina Bucy, MD   75 mg at 08/18/14 0934  . diltiazem (DILACOR XR) 24 hr capsule 120 mg  120 mg Oral Daily Evelina Bucy, MD   120 mg at 08/18/14 0934  . DULoxetine (CYMBALTA) DR capsule 60 mg  60 mg Oral Daily Evelina Bucy, MD   60 mg at 08/18/14 0934  . Emtricitab-Rilpivir-Tenofovir 200-25-300 MG TABS 1 tablet  1 tablet Oral Daily Evelina Bucy, MD   1 tablet at 08/18/14 0933  . famotidine (PEPCID) tablet 20 mg  20 mg Oral Daily PRN Evelina Bucy, MD      . gabapentin (NEURONTIN) capsule 300 mg  300 mg Oral TID Evelina Bucy, MD   300 mg at 08/18/14 0933  . ibuprofen (ADVIL,MOTRIN) tablet 600 mg  600 mg Oral Q8H PRN Noland Fordyce, PA-C   600 mg at 08/18/14 0559  . insulin aspart (novoLOG) injection 4 Units  4 Units Subcutaneous TID [facility] Evelina Bucy, MD   4 Units at 08/17/14 1806  . insulin glargine (LANTUS) injection 30 Units  30 Units Subcutaneous QHS Evelina Bucy, MD   30 Units at 08/17/14 2127  . LORazepam (ATIVAN) tablet 1 mg  1 mg Oral  Q8H PRN Noland Fordyce, PA-C   1 mg at 08/17/14 0301  . metFORMIN (GLUCOPHAGE) tablet 1,000 mg  1,000 mg Oral BID WC Waylan Boga, NP   1,000 mg at 08/17/14 1807  . ondansetron (ZOFRAN) tablet 4 mg  4 mg Oral Q8H PRN Noland Fordyce, PA-C      . pravastatin (PRAVACHOL) tablet 40 mg  40 mg Oral Daily Evelina Bucy, MD   40 mg at 08/18/14 0934  . zolpidem (AMBIEN) tablet 5 mg  5 mg Oral QHS PRN Noland Fordyce, PA-C   5 mg at 08/17/14 2127   Current Outpatient Prescriptions  Medication Sig Dispense Refill  . allopurinol (ZYLOPRIM) 100 MG tablet Take 200 mg by mouth daily.    Marland Kitchen aspirin EC 81 MG tablet Take 81 mg by mouth daily.    . clopidogrel (PLAVIX) 75 MG tablet Take 75 mg by mouth daily.    . diclofenac (VOLTAREN) 25 MG EC tablet Take 1 tablet (25 mg total) by mouth 3 (three) times daily as needed. (Patient taking differently: Take 25 mg by mouth 3 (three) times daily as needed for mild pain. ) 90 tablet 0  . diclofenac sodium (VOLTAREN) 1 % GEL Apply 2 g topically daily as needed (pain).    Marland Kitchen  diltiazem (DILACOR XR) 120 MG 24 hr capsule Take 120 mg by mouth daily.    . DULoxetine (CYMBALTA) 60 MG capsule Take 60 mg by mouth daily.    . Emtricitab-Rilpivir-Tenofovir (COMPLERA) 200-25-300 MG TABS Take 1 tablet by mouth daily.    . famotidine (PEPCID) 20 MG tablet Take 1 tablet (20 mg total) by mouth daily at 8 pm. (Patient taking differently: Take 20 mg by mouth daily as needed for heartburn. ) 30 tablet 3  . fentaNYL (DURAGESIC) 12 MCG/HR Place 1 patch (12.5 mcg total) onto the skin every 3 (three) days. 5 patch 0  . gabapentin (NEURONTIN) 300 MG capsule Take 300 mg by mouth 3 (three) times daily.    Marland Kitchen ibuprofen (ADVIL,MOTRIN) 600 MG tablet Take 1 tablet (600 mg total) by mouth every 6 (six) hours as needed. 30 tablet 0  . insulin aspart (NOVOLOG) 100 UNIT/ML injection Inject 4 Units into the skin 3 (three) times daily before meals.     . Insulin Glargine (LANTUS) 100 UNIT/ML Solostar Pen Inject 30  Units into the skin at bedtime.    Marland Kitchen ipratropium-albuterol (DUONEB) 0.5-2.5 (3) MG/3ML SOLN Take 3 mLs by nebulization every 4 (four) hours as needed (shortness of breath).    . isosorbide mononitrate (IMDUR) 30 MG 24 hr tablet Take 1 tablet (30 mg total) by mouth daily. 30 tablet 6  . lisinopril (PRINIVIL,ZESTRIL) 10 MG tablet Take 5 mg by mouth daily.     . metFORMIN (GLUCOPHAGE) 1000 MG tablet Take 1,000 mg by mouth 2 (two) times daily with a meal.    . methocarbamol (ROBAXIN) 500 MG tablet Take 1 tablet (500 mg total) by mouth every 6 (six) hours as needed for muscle spasms (or pain). 20 tablet 0  . oxyCODONE-acetaminophen (PERCOCET) 7.5-325 MG per tablet Take 1-2 tablets by mouth every 4 (four) hours as needed for pain. 20 tablet 0  . pravastatin (PRAVACHOL) 40 MG tablet Take 40 mg by mouth daily.    . nitroGLYCERIN (NITROSTAT) 0.4 MG SL tablet Place 0.4 mg under the tongue every 5 (five) minutes as needed for chest pain.    . traMADol (ULTRAM) 50 MG tablet Take 50 mg by mouth every 6 (six) hours as needed for moderate pain.      Psychiatric Specialty Exam:     Blood pressure 106/72, pulse 98, temperature 98.4 F (36.9 C), temperature source Oral, resp. rate 15, height '5\' 7"'  (1.702 m), weight 176 lb (79.833 kg), SpO2 100 %.Body mass index is 27.56 kg/(m^2).  General Appearance: Disheveled  Eye Sport and exercise psychologist::  Fair  Speech:  Normal Rate  Volume:  Normal  Mood:  Depressed  Affect:  Congruent  Thought Process:  Coherent  Orientation:  Full (Time, Place, and Person)  Thought Content:  Hallucinations: Auditory Visual  Suicidal Thoughts: deneis  Homicidal Thoughts:  No  Memory:  Immediate;   Fair Recent;   Fair Remote;   Fair  Judgement:  Poor  Insight:  Fair  Psychomotor Activity:  Decreased  Concentration:  Fair  Recall:  [place]: Fair  Akathisia:  No  Handed:  Right  AIMS (if indicated):     Assets:  Leisure Time Resilience  Sleep:       Musculoskeletal: Strength & Muscle Tone: within normal limits Gait & Station: normal Patient leans: N/A  Treatment Plan Summary:  Discharge to shelter with appointment for follow up of depression with psychotic features     LORD, Theodoro Clock, PMHNP-BC  08/18/2014 10:36 AM    Patient seen, evaluated and I agree with notes by Nurse Practitioner. Corena Pilgrim, MD

## 2014-08-18 NOTE — Progress Notes (Signed)
INITIAL NUTRITION ASSESSMENT  DOCUMENTATION CODES Per approved criteria  -Not Applicable   INTERVENTION: Encourage consistent meals with snacks, emphasizing protein consumption.  NUTRITION DIAGNOSIS: Inadequate oral intake related to decreased appetite as evidenced by pt report.   Goal: Pt to meet >/= 90% of their estimated nutrition needs   Monitor:  PO intake, weight, labs, I/O's  Reason for Assessment: Consult for nutritional assessment  Admitting Dx: Suicidal ideation  ASSESSMENT: 63 year old male patient who is currently homeless/ lives in a hotel. Admitted for depression and cocaine abuse.  Pt was being discharged during visit.  PO intake: 100% of cheese and crackers. Lunch tray was in pt's room during visit, po intake was 25-50%.  Pt reports 10 lb weight loss, UBW of 186 lb. Pt is seen by outpatient RD for diabetes services.  Pt states his appetite fluctuates. He is concerned he works out too much.  Pt with no questions regarding diabetes diet at this time. He has had diabetes since 2002.  Pt with no signs of fat or muscle depletion but pt complains of feeling weak.  Discussed with pt the importance of eating 3 meals a day with snacks, emphasizing protein consumption. Discussed the importance of good nutrition for mental health and aiding in recovery.   Labs reviewed: Glucose 283  Height: Ht Readings from Last 1 Encounters:  08/13/14 5\' 7"  (1.702 m)    Weight: Wt Readings from Last 1 Encounters:  08/13/14 176 lb (79.833 kg)     Ideal Body Weight: 148 lb  % Ideal Body Weight: 119%  Wt Readings from Last 10 Encounters:  08/13/14 176 lb (79.833 kg)  08/05/14 163 lb 2 oz (73.993 kg)  08/04/14 175 lb (79.379 kg)  08/02/14 176 lb (79.833 kg)  07/27/14 176 lb (79.833 kg)  07/13/14 176 lb 4.8 oz (79.969 kg)  06/28/14 179 lb 7.3 oz (81.4 kg)  06/21/14 175 lb (79.379 kg)  05/12/14 167 lb (75.751 kg)  05/01/14 173 lb 11.6 oz (78.8 kg)    Usual Body  Weight: 186 lb  % Usual Body Weight: 95%  BMI:  Body mass index is 27.56 kg/(m^2).  Estimated Nutritional Needs: Kcal: [id number] Protein: 90-100g Fluid: 1.7L/day  Skin: blister on bottom of left foot  Diet Order: Diet regular  EDUCATION NEEDS: -Education not appropriate at this time  No intake or output data in the 24 hours ending 08/18/14 1618  Last BM: 11/8  Labs:   Recent Labs Lab 08/13/14 2108 08/14/14 1727 08/15/14 1223  NA 137 138 137  K 3.8 4.0 4.1  CL 99 103 101  CO2 27 22 24   BUN 19 19 18   CREATININE 1.57* 1.28 1.22  CALCIUM 9.6 9.1 9.5  GLUCOSE 333* 413* 283*    CBG (last 3)   Recent Labs  08/17/14 1756 08/17/14 2048 08/18/14 0830  GLUCAP 104* 158* 89    Scheduled Meds: . allopurinol  200 mg Oral Daily  . aspirin EC  81 mg Oral Daily  . clopidogrel  75 mg Oral Daily  . diltiazem  120 mg Oral Daily  . DULoxetine  60 mg Oral Daily  . Emtricitab-Rilpivir-Tenofovir  1 tablet Oral Daily  . gabapentin  300 mg Oral TID  . insulin aspart  4 Units Subcutaneous TID AC  . insulin glargine  30 Units Subcutaneous QHS  . metFORMIN  1,000 mg Oral BID WC  . pravastatin  40 mg Oral Daily    Continuous Infusions:   Past Medical History  Diagnosis Date  . Diabetes mellitus     Diagnosed in 2002, started insulin in 2012  . Hypertension   . Headache(784.0)     CT head 08/2011: Periventricular and subcortical white matter hypodensities are most in keeping with chronic microangiopathic change  . Gout   . Hyperlipidemia   . HIV infection Nov 2012    Followed by [name]  . Crack cocaine use     for 20+ years, has been enrolled in detox programs in the past  . Chondromalacia of medial femoral condyle     Left knee MRI 04/28/12: Chondromalacia of the medial femoral condyle with slight peripheral degeneration of the meniscocapsular junction of the medial meniscus; followed by sports medicine  . Asthma     No PFTs, history of childhood asthma  .  Depression     with history of hospitalization for suicidal ideation  . CAD (coronary artery disease)     a. 06/2013 STEMI/PCI ([facility]): LAD w/ thrombus (treated with BMS), mid 75%, D2 75%; LCX OM2 75%; RCA small, PDA 95%, PLV 95%;  b. 10/2013 Cath/PCI: ISR w/in LAD (Promus DES x 2), borderline OM2 lesion;  c. 01/2014 MV: Intermediate risk, medium-sized distal ant wall infarct w/ very small amt of peri-infarct ischemia. EF 60%.  . Gout 04/28/2012  . Collagen vascular disease   . Cellulitis 04/2014    left facial    Past Surgical History  Procedure Laterality Date  . Prostate surgery    . Bowel resection    . Back surgery      1988  . Cardiac surgery    . Cervical spine surgery      " rods in my neck "  . Coronary stent placement    . Nm myocar perf wall motion  12/27/2011    normal    Clayton Bibles, MS, RD, LDN Pager: [phone] After Hours Pager: [phone]

## 2014-08-18 NOTE — Progress Notes (Signed)
CSW met with pt at bedside, to discuss discharge. Pt states he lives in a hotel. Pt states he wants to return to [facility]. Patient able to complete ADL'S and no skilled nursing facility.  At this time, patient will need to follow up with PCP dr for orders for skilled nursing. CSW provided patient with contact number for [facility].   Noreene Larsson [phone]  ED CSW 08/18/2014 1510pm

## 2014-08-19 ENCOUNTER — Emergency Department (HOSPITAL_COMMUNITY)
Admission: EM | Admit: 2014-08-19 | Discharge: 2014-08-19 | Disposition: A | Discharge status: Home / Self Care | Payer: Medicare Other | Attending: Emergency Medicine | Admitting: Emergency Medicine

## 2014-08-19 ENCOUNTER — Emergency Department (HOSPITAL_COMMUNITY): Payer: Medicare Other

## 2014-08-19 ENCOUNTER — Encounter (HOSPITAL_COMMUNITY): Payer: Self-pay | Admitting: Emergency Medicine

## 2014-08-19 DIAGNOSIS — S90822D Blister (nonthermal), left foot, subsequent encounter: Secondary | ICD-10-CM | POA: Diagnosis not present

## 2014-08-19 DIAGNOSIS — M109 Gout, unspecified: Secondary | ICD-10-CM | POA: Insufficient documentation

## 2014-08-19 DIAGNOSIS — Z794 Long term (current) use of insulin: Secondary | ICD-10-CM | POA: Diagnosis not present

## 2014-08-19 DIAGNOSIS — F329 Major depressive disorder, single episode, unspecified: Secondary | ICD-10-CM | POA: Diagnosis not present

## 2014-08-19 DIAGNOSIS — L089 Local infection of the skin and subcutaneous tissue, unspecified: Secondary | ICD-10-CM | POA: Diagnosis not present

## 2014-08-19 DIAGNOSIS — Z7902 Long term (current) use of antithrombotics/antiplatelets: Secondary | ICD-10-CM | POA: Diagnosis not present

## 2014-08-19 DIAGNOSIS — Z21 Asymptomatic human immunodeficiency virus [HIV] infection status: Secondary | ICD-10-CM | POA: Insufficient documentation

## 2014-08-19 DIAGNOSIS — I2510 Atherosclerotic heart disease of native coronary artery without angina pectoris: Secondary | ICD-10-CM | POA: Insufficient documentation

## 2014-08-19 DIAGNOSIS — M79672 Pain in left foot: Secondary | ICD-10-CM | POA: Diagnosis present

## 2014-08-19 DIAGNOSIS — I10 Essential (primary) hypertension: Secondary | ICD-10-CM | POA: Insufficient documentation

## 2014-08-19 DIAGNOSIS — E119 Type 2 diabetes mellitus without complications: Secondary | ICD-10-CM | POA: Insufficient documentation

## 2014-08-19 DIAGNOSIS — E785 Hyperlipidemia, unspecified: Secondary | ICD-10-CM | POA: Diagnosis not present

## 2014-08-19 DIAGNOSIS — Z7982 Long term (current) use of aspirin: Secondary | ICD-10-CM | POA: Insufficient documentation

## 2014-08-19 DIAGNOSIS — Z8739 Personal history of other diseases of the musculoskeletal system and connective tissue: Secondary | ICD-10-CM | POA: Insufficient documentation

## 2014-08-19 DIAGNOSIS — J45909 Unspecified asthma, uncomplicated: Secondary | ICD-10-CM | POA: Diagnosis not present

## 2014-08-19 DIAGNOSIS — X58XXXD Exposure to other specified factors, subsequent encounter: Secondary | ICD-10-CM | POA: Diagnosis not present

## 2014-08-19 DIAGNOSIS — Z79899 Other long term (current) drug therapy: Secondary | ICD-10-CM | POA: Diagnosis not present

## 2014-08-19 MED ORDER — TRAMADOL HCL 50 MG PO TABS
50.0000 mg | ORAL_TABLET | Freq: Once | ORAL | Status: AC
  Administered 2014-08-19: 50 mg via ORAL
  Filled 2014-08-19: qty 1

## 2014-08-19 MED ORDER — CEPHALEXIN 500 MG PO CAPS: 500.0000 mg | ORAL_CAPSULE | Freq: Two times a day (BID) | ORAL | Status: DC

## 2014-08-19 MED ORDER — TRAMADOL HCL 50 MG PO TABS: 50.0000 mg | ORAL_TABLET | Freq: Two times a day (BID) | ORAL | Status: DC | PRN

## 2014-08-19 NOTE — ED Provider Notes (Signed)
CSN: [id number]     Arrival date & time 08/19/14  2019 History  This chart was scribed for non-physician practitioner working with [name], MD by Mercy Moore, ED Scribe. This patient was seen in room TR08C/TR08C and the patient's care was started at 10:07 PM.   Chief Complaint  Patient presents with  . Foot Pain   The history is provided by the patient. No language interpreter was used.   HPI Comments: Michael [name] is a 63 y.o. male with history of Diabetes, Hypertension, Hyperlipidemia, HIV, CAD and gout who presents to the Emergency Department complaining of large painful, rupture blister to his left foot. Patient's blister is located on the plantar surface of his left food and spans from his second to fourth digit. Patient seen ED last night for same complaint. He was discharged with Keflex and Tramadol. Patient has not had his prescriptions filled. He has returned to the ED tonight because of the rupturing event when attempting to bear weight on the foot.  Per his notes, patient has history of numbness and tingling in his feet.   Past Medical History  Diagnosis Date  . Diabetes mellitus     Diagnosed in 2002, started insulin in 2012  . Hypertension   . Headache(784.0)     CT head 08/2011: Periventricular and subcortical white matter hypodensities are most in keeping with chronic microangiopathic change  . Gout   . Hyperlipidemia   . HIV infection Nov 2012    Followed by [name]  . Crack cocaine use     for 20+ years, has been enrolled in detox programs in the past  . Chondromalacia of medial femoral condyle     Left knee MRI 04/28/12: Chondromalacia of the medial femoral condyle with slight peripheral degeneration of the meniscocapsular junction of the medial meniscus; followed by sports medicine  . Asthma     No PFTs, history of childhood asthma  . Depression     with history of hospitalization for suicidal ideation  . CAD (coronary artery disease)     a. 06/2013  STEMI/PCI ([facility]): LAD w/ thrombus (treated with BMS), mid 75%, D2 75%; LCX OM2 75%; RCA small, PDA 95%, PLV 95%;  b. 10/2013 Cath/PCI: ISR w/in LAD (Promus DES x 2), borderline OM2 lesion;  c. 01/2014 MV: Intermediate risk, medium-sized distal ant wall infarct w/ very small amt of peri-infarct ischemia. EF 60%.  . Gout 04/28/2012  . Collagen vascular disease   . Cellulitis 04/2014    left facial   Past Surgical History  Procedure Laterality Date  . Prostate surgery    . Bowel resection    . Back surgery      1988  . Cardiac surgery    . Cervical spine surgery      " rods in my neck "  . Coronary stent placement    . Nm myocar perf wall motion  12/27/2011    normal   Family History  Problem Relation Age of Onset  . Diabetes Mother   . Diabetes Father   . Diabetes Brother   . Colon cancer Neg Hx    History  Substance Use Topics  . Smoking status: Never Smoker   . Smokeless tobacco: Never Used  . Alcohol Use: No    Review of Systems  Constitutional: Negative for fever and chills.  Skin: Positive for wound.  Neurological: Positive for numbness.  All other systems reviewed and are negative.   Allergies  Review of patient's  allergies indicates no known allergies.  Home Medications   Prior to Admission medications   Medication Sig Start Date End Date Taking? Authorizing Provider  allopurinol (ZYLOPRIM) 100 MG tablet Take 2 tablets (200 mg total) by mouth daily. 08/18/14  Yes Waylan Boga, NP  aspirin EC 81 MG tablet Take 1 tablet (81 mg total) by mouth daily. 08/18/14  Yes Waylan Boga, NP  clopidogrel (PLAVIX) 75 MG tablet Take 1 tablet (75 mg total) by mouth daily. 08/18/14  Yes Waylan Boga, NP  diclofenac (VOLTAREN) 25 MG EC tablet Take 1 tablet (25 mg total) by mouth 3 (three) times daily as needed for mild pain. 08/18/14  Yes Waylan Boga, NP  diclofenac sodium (VOLTAREN) 1 % GEL Apply 2 g topically daily as needed (pain). 08/18/14  Yes Waylan Boga, NP  diltiazem  (DILACOR XR) 120 MG 24 hr capsule Take 1 capsule (120 mg total) by mouth daily. 08/18/14  Yes Waylan Boga, NP  DULoxetine (CYMBALTA) 60 MG capsule Take 1 capsule (60 mg total) by mouth daily. 08/18/14  Yes Waylan Boga, NP  Emtricitab-Rilpivir-Tenofovir (COMPLERA) 200-25-300 MG TABS Take 1 tablet by mouth daily. 08/18/14  Yes Waylan Boga, NP  famotidine (PEPCID) 20 MG tablet Take 1 tablet (20 mg total) by mouth daily as needed for heartburn. 08/18/14  Yes Waylan Boga, NP  gabapentin (NEURONTIN) 300 MG capsule Take 1 capsule (300 mg total) by mouth 3 (three) times daily. 08/18/14  Yes Waylan Boga, NP  ibuprofen (ADVIL,MOTRIN) 600 MG tablet Take 1 tablet (600 mg total) by mouth every 6 (six) hours as needed. Patient taking differently: Take 600 mg by mouth every 6 (six) hours as needed for moderate pain.  08/18/14  Yes Waylan Boga, NP  insulin aspart (NOVOLOG) 100 UNIT/ML injection Inject 4 Units into the skin 3 (three) times daily before meals. 08/18/14  Yes Waylan Boga, NP  Insulin Glargine (LANTUS) 100 UNIT/ML Solostar Pen Inject 30 Units into the skin at bedtime. 08/18/14  Yes Waylan Boga, NP  ipratropium-albuterol (DUONEB) 0.5-2.5 (3) MG/3ML SOLN Take 3 mLs by nebulization every 4 (four) hours as needed (shortness of breath). 08/18/14  Yes Waylan Boga, NP  isosorbide mononitrate (IMDUR) 30 MG 24 hr tablet Take 1 tablet (30 mg total) by mouth daily. 08/18/14  Yes Waylan Boga, NP  lisinopril (PRINIVIL,ZESTRIL) 10 MG tablet Take 0.5 tablets (5 mg total) by mouth daily. 08/18/14  Yes Waylan Boga, NP  metFORMIN (GLUCOPHAGE) 1000 MG tablet Take 1 tablet (1,000 mg total) by mouth 2 (two) times daily with a meal. 08/18/14  Yes Waylan Boga, NP  methocarbamol (ROBAXIN) 500 MG tablet Take 1 tablet (500 mg total) by mouth every 6 (six) hours as needed for muscle spasms (or pain). 08/18/14  Yes Waylan Boga, NP  nitroGLYCERIN (NITROSTAT) 0.4 MG SL tablet Place 1 tablet (0.4 mg total) under the tongue  every 5 (five) minutes as needed for chest pain. 08/18/14  Yes Waylan Boga, NP  pravastatin (PRAVACHOL) 40 MG tablet Take 1 tablet (40 mg total) by mouth daily. 08/18/14  Yes Waylan Boga, NP  traMADol (ULTRAM) 50 MG tablet Take 1 tablet (50 mg total) by mouth every 6 (six) hours as needed for moderate pain. 08/18/14  Yes Waylan Boga, NP  cephALEXin (KEFLEX) 500 MG capsule Take 1 capsule (500 mg total) by mouth 2 (two) times daily. 08/19/14   Everlene Balls, MD  fentaNYL (DURAGESIC) 12 MCG/HR Place 1 patch (12.5 mcg total) onto the skin every 3 (three) days. Patient not taking:  Reported on 08/19/2014 08/18/14   Waylan Boga, NP  oxyCODONE-acetaminophen (PERCOCET) 7.5-325 MG per tablet Take 1-2 tablets by mouth every 4 (four) hours as needed for pain. 08/18/14   Waylan Boga, NP  traMADol (ULTRAM) 50 MG tablet Take 1 tablet (50 mg total) by mouth every 12 (twelve) hours as needed for severe pain. Patient not taking: Reported on 08/19/2014 08/19/14   Everlene Balls, MD   BP 137/86 mmHg  Pulse 98  Temp(Src) 98.2 F (36.8 C) (Oral)  Resp 22  SpO2 99%   Physical Exam  Constitutional: He is oriented to person, place, and time. He appears well-developed and well-nourished. No distress.  HENT:  Head: Normocephalic and atraumatic.  Eyes: EOM are normal.  Neck: Neck supple. No tracheal deviation present.  Cardiovascular: Normal rate.   Pulmonary/Chest: Effort normal. No respiratory distress.  Musculoskeletal: Normal range of motion.  Neurological: He is alert and oriented to person, place, and time.  Skin: Skin is warm and dry.  6cm x 3cm blister that is predominately dry skin. No fluctuance or drainage currently. Small opening in the blister at the base of the third toe.   Psychiatric: He has a normal mood and affect. His behavior is normal.  Nursing note and vitals reviewed.   ED Course  Procedures (including critical care time)  COORDINATION OF CARE: 10:13 PM- Discussed treatment plan with  patient at bedside and patient agreed to plan.   Labs Review Labs Reviewed - No data to display  Imaging Review Dg Foot Complete Left  08/19/2014   CLINICAL DATA:  Left foot soreness last 2 days. Plantar blister from 2nd to 5th toe at the level of the MTP joints.  EXAM: LEFT FOOT - COMPLETE 3+ VIEW  COMPARISON:  None.  FINDINGS: Mild degenerative change of the first MTP joint. No evidence of fracture dislocation no significant soft tissue abnormality.  IMPRESSION: No acute findings.   Electronically Signed   By: Marin Olp M.D.   On: 08/19/2014 00:36     EKG Interpretation None     Blister on plantar surface of left foot spontaneously drained via a small opening at base of third toe earlier today.  No current fluctuance noted.  Betadine soak.  Continuation of antibiotic therapy initiated yesterday.  Follow-up with PCP.  MDM   Final diagnoses:  None    Infected plantar blister left foot.  I personally performed the services described in this documentation, which was scribed in my presence. The recorded information has been reviewed and is accurate.    Norman Herrlich, NP 08/19/14 [place], MD 08/20/14 [phone]

## 2014-08-19 NOTE — Discharge Instructions (Signed)
Blisters Blisters are fluid-filled sacs that form within the skin. Common causes of blistering are friction, burns, and exposure to irritating chemicals. The fluid in the blister protects the underlying damaged skin. Most of the time it is not recommended that you open blisters. When a blister is opened, there is an increased chance for infection. Usually, a blister will open on its own. They then dry up and peel off within 10 days. If the blister is tense and uncomfortable (painful) the fluid may be drained. If it is drained the roof of the blister should be left intact. The draining should only be done by a medical professional under aseptic conditions. Poorly fitting shoes and boots can cause blisters by being too tight or too loose. Wearing extra socks or using tape, bandages, or pads over the blister-prone area helps prevent the problem by reducing friction. Blisters heal more slowly if you have diabetes or if you have problems with your circulation. You need to be careful about medical follow-up to prevent infection. HOME CARE INSTRUCTIONS  Protect areas where blisters have formed until the skin is healed. Use a special bandage with a hole cut in the middle around the blister. This reduces pressure and friction. When the blister breaks, trim off the loose skin and keep the area clean by washing it with soap daily. Soaking the blister or broken-open blister with diluted vinegar twice daily for 15 minutes will dry it up and speed the healing. Use 3 tablespoons of white vinegar per quart of water (45 mL white vinegar per liter of water). An antibiotic ointment and a bandage can be used to cover the area after soaking.  SEEK MEDICAL CARE IF:   You develop increased redness, pain, swelling, or drainage in the blistered area.  You develop a pus-like discharge from the blistered area, chills, or a fever. MAKE SURE YOU:   Understand these instructions.  Will watch your condition.  Will get help right  away if you are not doing well or get worse. Document Released: 10/31/2004 Document Revised: 12/16/2011 Document Reviewed: 09/28/2008 [facility] Patient Information 2015 [place], [place]. This information is not intended to replace advice given to you by your health care provider. Make sure you discuss any questions you have with your health care provider.  PLEASE FILL THE PRESCRIPTIONS YOU WERE PROVIDED DURING YOUR VISIT YESTERDAY.  FOLLOW-UP WITH YOUR PRIMARY CARE PROVIDER EARLY NEXT WEEK.

## 2014-08-19 NOTE — ED Notes (Signed)
Pt. reports persistent pain at left foot blister , seen here last night for the same complaint discharged home with prescriptions for pain /antibiotic but did not fill his prescription .

## 2014-08-19 NOTE — ED Notes (Signed)
Pt to ED c/o blister to L foot. Seen here last night and d/c with prescriptions. Pt unable to fill prescriptions due to transportation. Reports blister ruptured today and he is concerned due to hx of diabetes. Pt's foot soaking in betadine and saline solution per NP

## 2014-08-19 NOTE — Discharge Instructions (Signed)
Blisters Michael [name], he was seen today for a blister on her foot. X-ray does not reveal any foreign bodies. Take antibiotics as prescribed and follow-up with a podiatrist for treatment. If any symptoms worsen come back to emergency department for repeat evaluation. Thank you. Blisters are fluid-filled sacs that form within the skin. Common causes of blistering are friction, burns, and exposure to irritating chemicals. The fluid in the blister protects the underlying damaged skin. Most of the time it is not recommended that you open blisters. When a blister is opened, there is an increased chance for infection. Usually, a blister will open on its own. They then dry up and peel off within 10 days. If the blister is tense and uncomfortable (painful) the fluid may be drained. If it is drained the roof of the blister should be left intact. The draining should only be done by a medical professional under aseptic conditions. Poorly fitting shoes and boots can cause blisters by being too tight or too loose. Wearing extra socks or using tape, bandages, or pads over the blister-prone area helps prevent the problem by reducing friction. Blisters heal more slowly if you have diabetes or if you have problems with your circulation. You need to be careful about medical follow-up to prevent infection. HOME CARE INSTRUCTIONS  Protect areas where blisters have formed until the skin is healed. Use a special bandage with a hole cut in the middle around the blister. This reduces pressure and friction. When the blister breaks, trim off the loose skin and keep the area clean by washing it with soap daily. Soaking the blister or broken-open blister with diluted vinegar twice daily for 15 minutes will dry it up and speed the healing. Use 3 tablespoons of white vinegar per quart of water (45 mL white vinegar per liter of water). An antibiotic ointment and a bandage can be used to cover the area after soaking.  SEEK MEDICAL CARE IF:     You develop increased redness, pain, swelling, or drainage in the blistered area.  You develop a pus-like discharge from the blistered area, chills, or a fever. MAKE SURE YOU:   Understand these instructions.  Will watch your condition.  Will get help right away if you are not doing well or get worse. Document Released: 10/31/2004 Document Revised: 12/16/2011 Document Reviewed: 09/28/2008 [facility] Patient Information 2015 [place], [place]. This information is not intended to replace advice given to you by your health care provider. Make sure you discuss any questions you have with your health care provider.

## 2014-08-22 ENCOUNTER — Ambulatory Visit: Payer: Self-pay | Admitting: Infectious Diseases

## 2014-08-22 ENCOUNTER — Telehealth: Payer: Self-pay | Admitting: *Deleted

## 2014-08-22 NOTE — Telephone Encounter (Signed)
Left message notifying patient of missed visit.

## 2014-09-01 ENCOUNTER — Encounter (HOSPITAL_COMMUNITY): Payer: Self-pay | Admitting: *Deleted

## 2014-09-01 ENCOUNTER — Emergency Department (HOSPITAL_COMMUNITY)
Admission: EM | Admit: 2014-09-01 | Discharge: 2014-09-01 | Disposition: A | Discharge status: Home / Self Care | Payer: Medicare Other | Attending: Emergency Medicine | Admitting: Emergency Medicine

## 2014-09-01 ENCOUNTER — Emergency Department (HOSPITAL_COMMUNITY)
Admission: EM | Admit: 2014-09-01 | Discharge: 2014-09-01 | Disposition: A | Discharge status: Home / Self Care | Payer: Medicare Other | Source: Home / Self Care | Attending: Emergency Medicine | Admitting: Emergency Medicine

## 2014-09-01 DIAGNOSIS — J45901 Unspecified asthma with (acute) exacerbation: Secondary | ICD-10-CM

## 2014-09-01 DIAGNOSIS — Z9861 Coronary angioplasty status: Secondary | ICD-10-CM | POA: Insufficient documentation

## 2014-09-01 DIAGNOSIS — Z79899 Other long term (current) drug therapy: Secondary | ICD-10-CM

## 2014-09-01 DIAGNOSIS — Z7902 Long term (current) use of antithrombotics/antiplatelets: Secondary | ICD-10-CM | POA: Insufficient documentation

## 2014-09-01 DIAGNOSIS — I2510 Atherosclerotic heart disease of native coronary artery without angina pectoris: Secondary | ICD-10-CM | POA: Insufficient documentation

## 2014-09-01 DIAGNOSIS — R202 Paresthesia of skin: Secondary | ICD-10-CM

## 2014-09-01 DIAGNOSIS — J45909 Unspecified asthma, uncomplicated: Secondary | ICD-10-CM | POA: Insufficient documentation

## 2014-09-01 DIAGNOSIS — G8929 Other chronic pain: Secondary | ICD-10-CM | POA: Insufficient documentation

## 2014-09-01 DIAGNOSIS — F329 Major depressive disorder, single episode, unspecified: Secondary | ICD-10-CM

## 2014-09-01 DIAGNOSIS — E119 Type 2 diabetes mellitus without complications: Secondary | ICD-10-CM | POA: Diagnosis not present

## 2014-09-01 DIAGNOSIS — E785 Hyperlipidemia, unspecified: Secondary | ICD-10-CM | POA: Diagnosis not present

## 2014-09-01 DIAGNOSIS — Z872 Personal history of diseases of the skin and subcutaneous tissue: Secondary | ICD-10-CM | POA: Diagnosis not present

## 2014-09-01 DIAGNOSIS — Z21 Asymptomatic human immunodeficiency virus [HIV] infection status: Secondary | ICD-10-CM | POA: Insufficient documentation

## 2014-09-01 DIAGNOSIS — Z794 Long term (current) use of insulin: Secondary | ICD-10-CM

## 2014-09-01 DIAGNOSIS — M549 Dorsalgia, unspecified: Secondary | ICD-10-CM

## 2014-09-01 DIAGNOSIS — I10 Essential (primary) hypertension: Secondary | ICD-10-CM

## 2014-09-01 DIAGNOSIS — Z7982 Long term (current) use of aspirin: Secondary | ICD-10-CM | POA: Insufficient documentation

## 2014-09-01 DIAGNOSIS — M109 Gout, unspecified: Secondary | ICD-10-CM | POA: Insufficient documentation

## 2014-09-01 DIAGNOSIS — M545 Low back pain: Secondary | ICD-10-CM | POA: Diagnosis present

## 2014-09-01 LAB — CBG MONITORING, ED: Glucose-Capillary: 172 mg/dL — ABNORMAL HIGH (ref 70–99)

## 2014-09-01 MED ORDER — DIPHENHYDRAMINE HCL 25 MG PO CAPS
50.0000 mg | ORAL_CAPSULE | Freq: Once | ORAL | Status: AC
  Administered 2014-09-01: 50 mg via ORAL
  Filled 2014-09-01: qty 2

## 2014-09-01 MED ORDER — HYDROMORPHONE HCL 1 MG/ML IJ SOLN
2.0000 mg | Freq: Once | INTRAMUSCULAR | Status: AC
  Administered 2014-09-01: 2 mg via INTRAMUSCULAR
  Filled 2014-09-01: qty 2

## 2014-09-01 MED ORDER — ALBUTEROL SULFATE HFA 108 (90 BASE) MCG/ACT IN AERS
6.0000 | INHALATION_SPRAY | Freq: Once | RESPIRATORY_TRACT | Status: AC
  Administered 2014-09-01: 6 via RESPIRATORY_TRACT
  Filled 2014-09-01: qty 6.7

## 2014-09-01 MED ORDER — CYCLOBENZAPRINE HCL 7.5 MG PO TABS: 7.5000 mg | ORAL_TABLET | Freq: Three times a day (TID) | ORAL | Status: DC | PRN

## 2014-09-01 NOTE — Discharge Instructions (Signed)
Chronic Back Pain  When back pain lasts longer than 3 months, it is called chronic back pain.People with chronic back pain often go through certain periods that are more intense (flare-ups).  CAUSES Chronic back pain can be caused by wear and tear (degeneration) on different structures in your back. These structures include:  The bones of your spine (vertebrae) and the joints surrounding your spinal cord and nerve roots (facets).  The strong, fibrous tissues that connect your vertebrae (ligaments). Degeneration of these structures may result in pressure on your nerves. This can lead to constant pain. HOME CARE INSTRUCTIONS  Avoid bending, heavy lifting, prolonged sitting, and activities which make the problem worse.  Take brief periods of rest throughout the day to reduce your pain. Lying down or standing usually is better than sitting while you are resting.  Take over-the-counter or prescription medicines only as directed by your caregiver. SEEK IMMEDIATE MEDICAL CARE IF:   You have weakness or numbness in one of your legs or feet.  You have trouble controlling your bladder or bowels.  You have nausea, vomiting, abdominal pain, shortness of breath, or fainting. Document Released: 10/31/2004 Document Revised: 12/16/2011 Document Reviewed: 09/07/2011 [facility] Patient Information 2015 [place], [place]. This information is not intended to replace advice given to you by your health care provider. Make sure you discuss any questions you have with your health care provider.   Emergency Department Resource Guide 1) Find a Doctor and Pay Out of Pocket Although you won't have to find out who is covered by your insurance plan, it is a good idea to ask around and get recommendations. You will then need to call the office and see if the doctor you have chosen will accept you as a new patient and what types of options they offer for patients who are self-pay. Some doctors offer discounts or will set  up payment plans for their patients who do not have insurance, but you will need to ask so you aren't surprised when you get to your appointment.  2) Contact Your Local Health Department Not all health departments have doctors that can see patients for sick visits, but many do, so it is worth a call to see if yours does. If you don't know where your local health department is, you can check in your phone book. The CDC also has a tool to help you locate your state's health department, and many state websites also have listings of all of their local health departments.  3) Find a [facility] If your illness is not likely to be very severe or complicated, you may want to try a walk in clinic. These are popping up all over the country in pharmacies, drugstores, and shopping centers. They're usually staffed by nurse practitioners or physician assistants that have been trained to treat common illnesses and complaints. They're usually fairly quick and inexpensive. However, if you have serious medical issues or chronic medical problems, these are probably not your best option.  No Primary Care Doctor: - Call Health Connect at  [phone] - they can help you locate a primary care doctor that  accepts your insurance, provides certain services, etc. - Physician Referral Service- [phone]  Chronic Pain Problems: Organization         Address  Phone   Notes  [facility]  [phone] Patients need to be referred by their primary care doctor.   Medication Assistance: Organization         Address  Phone   Notes  [facility] Medication [facility] [address] [place], [place] [address] [phone] --Must be a resident of [facility] -- Must have NO insurance coverage whatsoever (no Medicaid/ Medicare, etc.) -- The pt. MUST have a primary care doctor that directs their care regularly and follows them in the community   MedAssist  [phone]    [employer]  [phone]    Agencies that provide inexpensive medical care: Organization         Address  Phone   Notes  [place]  [phone]   [name]    [phone]   [facility] [place], [place] [address] [phone]   [place] [address], [place] [phone]   Planned Parenthood    [phone]   [facility]    [phone]   [place] and [place] [address], [place] Phone:  [phone], Fax:  [phone] Hours of Operation:  9 am - 6 pm, M-F.  Also accepts Medicaid/Medicare and self-pay.  [facility] for [place], [address], [place] Phone: [phone], Fax: [phone]. Hours of Operation:  8:30 am - 5:30 pm, M-F.  Also accepts Medicaid and self-pay.  [facility] [address], [place] Phone: [phone]   [place], [place], [place] [phone], Ext. [phone] Mondays & Thursdays: 7-9 AM.  First 15 patients are seen on a first come, first serve basis.    [place] Providers:  Organization         Address  Phone   Notes  [facility] [address], [place] [phone] Also accepts self-pay patients.  [facility] [id number] [place], [place]  [phone]   [place], [address], [place] [phone]   [facility] [address] [place] [phone]   Lucianne Lei [address], [place]   [phone] Only accepts [place] Access [place] patients after they have their name applied to their card.   Self-Pay (no insurance) in [facility]:  Organization         Address  Phone   Notes  Sickle Cell Patients, [facility] [place] [phone]   [facility] [place] [phone]   [name] [place]  [place], [place], [place] [phone]   [facility]/Dr. Osei-Bonsu  [address], [place] or [address], [place] [phone] Phone number for both [place] and [place] locations is the same.  [facility] [address], [place] [phone]   [facility] [address] [place] or [address] [phone]   [facility] [address] [place] [phone], phone; [phone], fax Sees patients 1st and 3rd Saturday of every month.  Must not qualify for public or private insurance (i.e. Medicaid, Medicare, [place] Health Choice, Veterans' Benefits)  Household income should be no more than 200% of the poverty level The clinic cannot treat you if you are pregnant or think you are pregnant  Sexually transmitted diseases are not treated at the clinic.    Dental Care: Organization         Address  Phone  Notes  [facility] Department of [facility] [place] [phone] Accepts children up to age 59 who are enrolled in [place] or [place]; pregnant women with a Medicaid card; and children who have applied for Medicaid or [place] Health Choice, but were declined, whose parents can pay a reduced fee at time of service.  [facility] Department of [facility]  [address], [place] [phone] Accepts children up to age 34 who are enrolled in [place] or [place]; pregnant women with a Medicaid card; and children who have applied for Medicaid or [place] Health Choice, but were declined, whose parents can pay a reduced fee at time of service.  [place] [facility] Access PRO[facility]  [place] [phone] Patients are seen by appointment only. Walk-ins are not accepted. [place] will see patients 78 years of age and older. Monday - Tuesday (8am-5pm) Most Wednesdays (8:30-5pm) $30 per visit, cash only  [facility] Adult Dental Access PROGRAM  [address], [facility] [phone] Patients are seen by appointment only. Walk-ins are not accepted. [place] will see patients 17 years of age and older. One Wednesday Evening (Monthly: Volunteer Based).  $30 per visit, cash only  [place]  [phone] for adults; Children under age 56, call Graduate Pediatric Dentistry at [phone]. Children aged 81-14, please call [phone] to request a pediatric application.  Dental services are provided in all areas of dental care including fillings, crowns and bridges, complete and partial dentures, implants, gum treatment, root canals, and extractions. Preventive care is also provided. Treatment is provided to both adults and children. Patients are selected via a lottery and there is often a waiting list.   [facility] [address] [place]  [phone] [email or web address]   [facility] [address] [place], [place] [phone], Ext. [phone] Second and Fourth Thursday of each month, opens at 6:30 AM; Clinic ends at 9 AM.  Patients are seen on a first-come first-served basis, and a limited number are seen during each clinic.   [facility]  [address] Hillard Danker [place], [place] [phone]   Eligibility Requirements You must have lived in [place], [place], or [place] counties for at least the last three months.   You cannot be eligible for state or federal sponsored [employer], including [employer], [place], or [employer].   You generally cannot be eligible for healthcare insurance through your employer.    How to apply: Eligibility screenings are held every Tuesday and Wednesday afternoon  from 1:00 pm until 4:00 pm. You do not need an appointment for the interview!  [facility] [address] [place], [place]   [place]  [place] Department  [place]  [phone]    Behavioral Health Resources in the Community: Intensive Outpatient Programs Organization         Address  Phone  Notes  [place]. [address] [place], [place] [phone]   [facility] Outpatient [address], [place], [place]   ADS: Alcohol & Drug Svcs [address] [place], [place]  [place] [address],  [place], [place] or [phone]   Substance Abuse Resources Organization         Address  Phone  Notes  Alcohol and Drug Services  [phone]   [place]  [phone]   [facility] [place]   Chinita Pester  [phone]   Residential & Outpatient Substance Abuse Program  [phone]   Psychological Services Organization         Address  Phone  Notes  [facility]  [place]  [phone]   [place] [address] [place] or [phone]    Mobile Crisis Teams Organization         Address  Phone  Notes  [facility], Mobile Crisis Care Unit  [phone]   [facility]ive Psychotherapeutic Services  [address] [place], [place]   [place] [address], [place] [phone]    Self-Help/Support Groups Organization         Address  Phone             Notes  [place]. of [place] - variety of support groups  [place] Call for more information  Narcotics Anonymous (NA), [facility] [address], [employer] [place]  2 meetings at this location   Materials engineer         Address  Phone  Notes  [facility]idential [facility] [place],    [place]  [phone]   [facility]  [address], [place] [id number], [place], [place]   [place], [place] [phone] Admissions: 8am-3pm M-F  Incentives Substance [place] [address]    [place], [place] [phone]   [facility] [address] [place], [place], [place]   [facility] [address]  [place], [place]   [facility] - Intensive Outpatient [address] Kristeen [address], [place], [place]   [facility] ([place] [place].,  [place], [place] [phone] or [phone]   [facility] Services (RTS) [address], [place], [place] Accepts Medicaid  [facility] [place] [address]  [name] [phone] Substance Abuse/Addiction Treatment   [facility] Organization         Address  Phone  Notes  [facility]  [phone]   Domenic Schwab, PhD [address] Arlis Porta [place], [place]   [phone] or [phone]   [place], [place] [phone]   [facility] [address] [place], [place] [phone] Insurance/Medicaid/sponsorship through [facility] and Families [address]                                    [place], [place] [phone] [place] [name], [place] [phone]    Dr. Adele Schilder  [phone]   [facility] Dept. 1) [address], [place] 2) [place] 3)  [place] [address], [place] [phone]  [phone]   [place] [phone] or [phone] (  After Hours)

## 2014-09-01 NOTE — ED Notes (Signed)
Pt was seen and discharged here this am for chronic neck and back pain.  Was given 2 mg of im dilaudid.  Was waiting for ride to [facility] but son has not returned phone call to give him ride.  Pt now c/o tingling and itching all over body since discharge.  No hives/discoloration noted.  CBG 178 in triage.  Pt did not take insulin this am and had not eaten food given to him at discharge.

## 2014-09-01 NOTE — ED Provider Notes (Signed)
CSN: [id number]     Arrival date & time 09/01/14  [id number] History   First MD Initiated Contact with Patient 09/01/14 0732     Chief Complaint  Patient presents with  . Generalized Body Aches     (Consider location/radiation/quality/duration/timing/severity/associated sxs/prior Treatment) HPI Comments: Patient is a 63 year old male with a past medical history of diabetes, hypertension, HIV, cocaine abuse, asthma, and CAD who presents with generalized pain. Symptoms started gradually and progressively worsened since yesterday. The pain is aching and severe and located "all over his body." Patient reports chronic pain history. No new symptoms or injury. He has not tried anything for symptoms. He denies abdominal pain, fever, chest pain, cough.    Past Medical History  Diagnosis Date  . Diabetes mellitus     Diagnosed in 2002, started insulin in 2012  . Hypertension   . Headache(784.0)     CT head 08/2011: Periventricular and subcortical white matter hypodensities are most in keeping with chronic microangiopathic change  . Gout   . Hyperlipidemia   . HIV infection Nov 2012    Followed by [name]  . Crack cocaine use     for 20+ years, has been enrolled in detox programs in the past  . Chondromalacia of medial femoral condyle     Left knee MRI 04/28/12: Chondromalacia of the medial femoral condyle with slight peripheral degeneration of the meniscocapsular junction of the medial meniscus; followed by sports medicine  . Asthma     No PFTs, history of childhood asthma  . Depression     with history of hospitalization for suicidal ideation  . CAD (coronary artery disease)     a. 06/2013 STEMI/PCI ([facility]): LAD w/ thrombus (treated with BMS), mid 75%, D2 75%; LCX OM2 75%; RCA small, PDA 95%, PLV 95%;  b. 10/2013 Cath/PCI: ISR w/in LAD (Promus DES x 2), borderline OM2 lesion;  c. 01/2014 MV: Intermediate risk, medium-sized distal ant wall infarct w/ very small amt of peri-infarct ischemia. EF 60%.   . Gout 04/28/2012  . Collagen vascular disease   . Cellulitis 04/2014    left facial   Past Surgical History  Procedure Laterality Date  . Prostate surgery    . Bowel resection    . Back surgery      1988  . Cardiac surgery    . Cervical spine surgery      " rods in my neck "  . Coronary stent placement    . Nm myocar perf wall motion  12/27/2011    normal   Family History  Problem Relation Age of Onset  . Diabetes Mother   . Diabetes Father   . Diabetes Brother   . Colon cancer Neg Hx    History  Substance Use Topics  . Smoking status: Never Smoker   . Smokeless tobacco: Never Used  . Alcohol Use: No    Review of Systems  Constitutional: Negative for fever, chills and fatigue.  HENT: Negative for trouble swallowing.   Eyes: Negative for visual disturbance.  Respiratory: Negative for shortness of breath.   Cardiovascular: Negative for chest pain and palpitations.  Gastrointestinal: Negative for nausea, vomiting, abdominal pain and diarrhea.  Genitourinary: Negative for dysuria and difficulty urinating.  Musculoskeletal: Positive for myalgias. Negative for arthralgias and neck pain.  Skin: Negative for color change.  Neurological: Negative for dizziness and weakness.  Psychiatric/Behavioral: Negative for dysphoric mood.      Allergies  Review of patient's allergies indicates no known  allergies.  Home Medications   Prior to Admission medications   Medication Sig Start Date End Date Taking? Authorizing Provider  allopurinol (ZYLOPRIM) 100 MG tablet Take 2 tablets (200 mg total) by mouth daily. 08/18/14   Waylan Boga, NP  aspirin EC 81 MG tablet Take 1 tablet (81 mg total) by mouth daily. 08/18/14   Waylan Boga, NP  cephALEXin (KEFLEX) 500 MG capsule Take 1 capsule (500 mg total) by mouth 2 (two) times daily. 08/19/14   Everlene Balls, MD  clopidogrel (PLAVIX) 75 MG tablet Take 1 tablet (75 mg total) by mouth daily. 08/18/14   Waylan Boga, NP  diclofenac  (VOLTAREN) 25 MG EC tablet Take 1 tablet (25 mg total) by mouth 3 (three) times daily as needed for mild pain. 08/18/14   Waylan Boga, NP  diclofenac sodium (VOLTAREN) 1 % GEL Apply 2 g topically daily as needed (pain). 08/18/14   Waylan Boga, NP  diltiazem (DILACOR XR) 120 MG 24 hr capsule Take 1 capsule (120 mg total) by mouth daily. 08/18/14   Waylan Boga, NP  DULoxetine (CYMBALTA) 60 MG capsule Take 1 capsule (60 mg total) by mouth daily. 08/18/14   Waylan Boga, NP  Emtricitab-Rilpivir-Tenofovir (COMPLERA) 200-25-300 MG TABS Take 1 tablet by mouth daily. 08/18/14   Waylan Boga, NP  famotidine (PEPCID) 20 MG tablet Take 1 tablet (20 mg total) by mouth daily as needed for heartburn. 08/18/14   Waylan Boga, NP  fentaNYL (DURAGESIC) 12 MCG/HR Place 1 patch (12.5 mcg total) onto the skin every 3 (three) days. Patient not taking: Reported on 08/19/2014 08/18/14   Waylan Boga, NP  gabapentin (NEURONTIN) 300 MG capsule Take 1 capsule (300 mg total) by mouth 3 (three) times daily. 08/18/14   Waylan Boga, NP  ibuprofen (ADVIL,MOTRIN) 600 MG tablet Take 1 tablet (600 mg total) by mouth every 6 (six) hours as needed. Patient taking differently: Take 600 mg by mouth every 6 (six) hours as needed for moderate pain.  08/18/14   Waylan Boga, NP  insulin aspart (NOVOLOG) 100 UNIT/ML injection Inject 4 Units into the skin 3 (three) times daily before meals. 08/18/14   Waylan Boga, NP  Insulin Glargine (LANTUS) 100 UNIT/ML Solostar Pen Inject 30 Units into the skin at bedtime. 08/18/14   Waylan Boga, NP  ipratropium-albuterol (DUONEB) 0.5-2.5 (3) MG/3ML SOLN Take 3 mLs by nebulization every 4 (four) hours as needed (shortness of breath). 08/18/14   Waylan Boga, NP  isosorbide mononitrate (IMDUR) 30 MG 24 hr tablet Take 1 tablet (30 mg total) by mouth daily. 08/18/14   Waylan Boga, NP  lisinopril (PRINIVIL,ZESTRIL) 10 MG tablet Take 0.5 tablets (5 mg total) by mouth daily. 08/18/14   Waylan Boga, NP   metFORMIN (GLUCOPHAGE) 1000 MG tablet Take 1 tablet (1,000 mg total) by mouth 2 (two) times daily with a meal. 08/18/14   Waylan Boga, NP  methocarbamol (ROBAXIN) 500 MG tablet Take 1 tablet (500 mg total) by mouth every 6 (six) hours as needed for muscle spasms (or pain). 08/18/14   Waylan Boga, NP  nitroGLYCERIN (NITROSTAT) 0.4 MG SL tablet Place 1 tablet (0.4 mg total) under the tongue every 5 (five) minutes as needed for chest pain. 08/18/14   Waylan Boga, NP  oxyCODONE-acetaminophen (PERCOCET) 7.5-325 MG per tablet Take 1-2 tablets by mouth every 4 (four) hours as needed for pain. 08/18/14   Waylan Boga, NP  pravastatin (PRAVACHOL) 40 MG tablet Take 1 tablet (40 mg total) by mouth daily. 08/18/14   Theodoro Clock  Lord, NP  traMADol (ULTRAM) 50 MG tablet Take 1 tablet (50 mg total) by mouth every 6 (six) hours as needed for moderate pain. 08/18/14   Waylan Boga, NP  traMADol (ULTRAM) 50 MG tablet Take 1 tablet (50 mg total) by mouth every 12 (twelve) hours as needed for severe pain. Patient not taking: Reported on 08/19/2014 08/19/14   Everlene Balls, MD   BP 143/90 mmHg  Temp(Src) 98.9 F (37.2 C) (Oral)  Resp 18  SpO2 100% Physical Exam  Constitutional: He is oriented to person, place, and time. He appears well-developed and well-nourished. No distress.  HENT:  Head: Normocephalic and atraumatic.  Eyes: Conjunctivae and EOM are normal.  Neck: Normal range of motion.  Cardiovascular: Normal rate and regular rhythm.  Exam reveals no gallop and no friction rub.   No murmur heard. Pulmonary/Chest: Effort normal and breath sounds normal. He has no wheezes. He has no rales. He exhibits no tenderness.  Abdominal: Soft. He exhibits no distension. There is no tenderness. There is no rebound.  Musculoskeletal: Normal range of motion.  Bilateral paraspinal lumbar tenderness to palpation. No midline spine tenderness.   Neurological: He is alert and oriented to person, place, and time. Coordination  normal.  Extremity strength and sensation equal and intact bilaterally. Speech is goal-oriented. Moves limbs without ataxia.   Skin: Skin is warm and dry.  Psychiatric: He has a normal mood and affect. His behavior is normal.  Nursing note and vitals reviewed.   ED Course  Procedures (including critical care time) Labs Review Labs Reviewed - No data to display  Imaging Review No results found.   EKG Interpretation None      MDM   Final diagnoses:  Chronic back pain    7:59 AM Patient will have 2mg  IM dilaudid. Patient has chronic pain. No unchanged symptoms. Vitals stable and patient afebrile.   Patient will be discharged with Flexeril for pain. Patient instructed to follow up with PCP.    Alvina Chou, PA-C 09/01/14 1007  Debby Freiberg, MD 09/01/14 1324

## 2014-09-01 NOTE — Discharge Instructions (Signed)
Take benadryl 25 mg by mouth every 6 hours for the next 24 hours.   Drug Allergy Allergic reactions to medicines are common. Some allergic reactions are mild. A delayed type of drug allergy that occurs 1 week or more after exposure to a medicine or vaccine is called serum sickness. A life-threatening, sudden (acute) allergic reaction that involves the whole body is called anaphylaxis. CAUSES  "True" drug allergies occur when there is an allergic reaction to a medicine. This is caused by overactivity of the immune system. First, the body becomes sensitized. The immune system is triggered by your first exposure to the medicine. Following this first exposure, future exposure to the same medicine may be life-threatening. Almost any medicine can cause an allergic reaction. Common ones are:  Penicillin.  Sulfonamides (sulfa drugs).  Local anesthetics.  X-ray dyes that contain iodine. SYMPTOMS  Common symptoms of a minor allergic reaction are:  Swelling around the mouth.  An itchy red rash or hives.  Vomiting or diarrhea. Anaphylaxis can cause swelling of the mouth and throat. This makes it difficult to breathe and swallow. Severe reactions can be fatal within seconds, even after exposure to only a trace amount of the drug that causes the reaction. HOME CARE INSTRUCTIONS   If you are unsure of what caused your reaction, keep a diary of foods and medicines used. Include the symptoms that followed. Avoid anything that causes reactions.  You may want to follow up with an allergy specialist after the reaction has cleared in order to be tested to confirm the allergy. It is important to confirm that your reaction is an allergy, not just a side effect to the medicine. If you have a true allergy to a medicine, this may prevent that medicine and related medicines from being given to you when you are very ill.  If you have hives or a rash:  Take medicines as directed by your caregiver.  You may use  an over-the-counter antihistamine (diphenhydramine) as needed.  Apply cold compresses to the skin or take baths in cool water. Avoid hot baths or showers.  If you are severely allergic:  Continuous observation after a severe reaction may be needed. Hospitalization is often required.  Wear a medical alert bracelet or necklace stating your allergy.  You and your family must learn how to use an anaphylaxis kit or give an epinephrine injection to temporarily treat an emergency allergic reaction. If you have had a severe reaction, always carry your epinephrine injection or anaphylaxis kit with you. This can be lifesaving if you have a severe reaction.  Do not drive or perform tasks after treatment until the medicines used to treat your reaction have worn off, or until your caregiver says it is okay. SEEK MEDICAL CARE IF:   You think you had an allergic reaction. Symptoms usually start within 30 minutes after exposure.  Symptoms are getting worse rather than better.  You develop new symptoms.  The symptoms that brought you to your caregiver return. SEEK IMMEDIATE MEDICAL CARE IF:   You have swelling of the mouth, difficulty breathing, or wheezing.  You have a tight feeling in your chest or throat.  You develop hives, swelling, or itching all over your body.  You develop severe vomiting or diarrhea.  You feel faint or pass out. This is an emergency. Use your epinephrine injection or anaphylaxis kit as you have been instructed. Call for emergency medical help. Even if you improve after the injection, you need to be examined  at a hospital emergency department. MAKE SURE YOU:   Understand these instructions.  Will watch your condition.  Will get help right away if you are not doing well or get worse. Document Released: 09/23/2005 Document Revised: 12/16/2011 Document Reviewed: 02/27/2011 [facility] Patient Information 2015 [place], [place]. This information is not intended to replace  advice given to you by your health care provider. Make sure you discuss any questions you have with your health care provider.

## 2014-09-01 NOTE — ED Notes (Addendum)
Pt c/o generalized body aches.  States pain is chronic, r/t spinal surgery 30 years ago that was "ruined" when he was in an [place] in August.  Pt states his neurologist, Dr Arnoldo Morale, states he needs to have surgery again to "repair the damage".  Pt c/o neck pain radiating to head (causing headache) and radiating to shoulders.  Also c/o lumbar pain radiating to legs.  States has falled 3 times since Nov 6 b/c his legs gave out on him.

## 2014-09-01 NOTE — ED Notes (Addendum)
CBG 178 

## 2014-09-01 NOTE — ED Notes (Signed)
Pt reporting tingling and itching after dilaudid injection.

## 2014-09-01 NOTE — ED Provider Notes (Signed)
CSN: [id number]     Arrival date & time 09/01/14  1138 History   First MD Initiated Contact with Patient 09/01/14 1153     Chief Complaint  Patient presents with  . Tingling     (Consider location/radiation/quality/duration/timing/severity/associated sxs/prior Treatment) Patient is a 63 y.o. male presenting with general illness.  Illness Location:  Generalized Quality:  Tingling, itching Severity:  Moderate Onset quality:  Gradual Duration:  1 hour Timing:  Constant Progression:  Unchanged Chronicity:  New Context:  Seen earlier same day by myself, given dilaudid IM for neck and back pain Relieved by:  Nothing Worsened by:  Nothing Associated symptoms: shortness of breath   Associated symptoms: no abdominal pain, no chest pain, no congestion, no nausea and no vomiting     Past Medical History  Diagnosis Date  . Diabetes mellitus     Diagnosed in 2002, started insulin in 2012  . Hypertension   . Headache(784.0)     CT head 08/2011: Periventricular and subcortical white matter hypodensities are most in keeping with chronic microangiopathic change  . Gout   . Hyperlipidemia   . HIV infection Nov 2012    Followed by [name]  . Crack cocaine use     for 20+ years, has been enrolled in detox programs in the past  . Chondromalacia of medial femoral condyle     Left knee MRI 04/28/12: Chondromalacia of the medial femoral condyle with slight peripheral degeneration of the meniscocapsular junction of the medial meniscus; followed by sports medicine  . Asthma     No PFTs, history of childhood asthma  . Depression     with history of hospitalization for suicidal ideation  . CAD (coronary artery disease)     a. 06/2013 STEMI/PCI ([facility]): LAD w/ thrombus (treated with BMS), mid 75%, D2 75%; LCX OM2 75%; RCA small, PDA 95%, PLV 95%;  b. 10/2013 Cath/PCI: ISR w/in LAD (Promus DES x 2), borderline OM2 lesion;  c. 01/2014 MV: Intermediate risk, medium-sized distal ant wall infarct w/ very  small amt of peri-infarct ischemia. EF 60%.  . Gout 04/28/2012  . Collagen vascular disease   . Cellulitis 04/2014    left facial   Past Surgical History  Procedure Laterality Date  . Prostate surgery    . Bowel resection    . Back surgery      1988  . Cardiac surgery    . Cervical spine surgery      " rods in my neck "  . Coronary stent placement    . Nm myocar perf wall motion  12/27/2011    normal   Family History  Problem Relation Age of Onset  . Diabetes Mother   . Diabetes Father   . Diabetes Brother   . Colon cancer Neg Hx    History  Substance Use Topics  . Smoking status: Never Smoker   . Smokeless tobacco: Never Used  . Alcohol Use: No    Review of Systems  HENT: Negative for congestion.   Respiratory: Positive for shortness of breath.   Cardiovascular: Negative for chest pain.  Gastrointestinal: Negative for nausea, vomiting and abdominal pain.  All other systems reviewed and are negative.     Allergies  Review of patient's allergies indicates no known allergies.  Home Medications   Prior to Admission medications   Medication Sig Start Date End Date Taking? Authorizing Provider  albuterol (PROVENTIL HFA;VENTOLIN HFA) 108 (90 BASE) MCG/ACT inhaler Inhale 2 puffs into the lungs every  6 (six) hours as needed for wheezing or shortness of breath.   Yes Historical Provider, MD  allopurinol (ZYLOPRIM) 100 MG tablet Take 2 tablets (200 mg total) by mouth daily. 08/18/14  Yes Waylan Boga, NP  aspirin EC 81 MG tablet Take 1 tablet (81 mg total) by mouth daily. 08/18/14  Yes Waylan Boga, NP  clopidogrel (PLAVIX) 75 MG tablet Take 1 tablet (75 mg total) by mouth daily. 08/18/14  Yes Waylan Boga, NP  diclofenac (VOLTAREN) 25 MG EC tablet Take 1 tablet (25 mg total) by mouth 3 (three) times daily as needed for mild pain. 08/18/14  Yes Waylan Boga, NP  diclofenac sodium (VOLTAREN) 1 % GEL Apply 2 g topically daily as needed (pain). 08/18/14  Yes Waylan Boga, NP   diltiazem (DILACOR XR) 120 MG 24 hr capsule Take 1 capsule (120 mg total) by mouth daily. 08/18/14  Yes Waylan Boga, NP  DULoxetine (CYMBALTA) 60 MG capsule Take 1 capsule (60 mg total) by mouth daily. 08/18/14  Yes Waylan Boga, NP  Emtricitab-Rilpivir-Tenofovir (COMPLERA) 200-25-300 MG TABS Take 1 tablet by mouth daily. 08/18/14  Yes Waylan Boga, NP  famotidine (PEPCID) 20 MG tablet Take 1 tablet (20 mg total) by mouth daily as needed for heartburn. 08/18/14  Yes Waylan Boga, NP  fentaNYL (DURAGESIC) 12 MCG/HR Place 1 patch (12.5 mcg total) onto the skin every 3 (three) days. 08/18/14  Yes Waylan Boga, NP  gabapentin (NEURONTIN) 300 MG capsule Take 1 capsule (300 mg total) by mouth 3 (three) times daily. 08/18/14  Yes Waylan Boga, NP  ibuprofen (ADVIL,MOTRIN) 600 MG tablet Take 1 tablet (600 mg total) by mouth every 6 (six) hours as needed. Patient taking differently: Take 600 mg by mouth every 6 (six) hours as needed for moderate pain.  08/18/14  Yes Waylan Boga, NP  insulin aspart (NOVOLOG) 100 UNIT/ML injection Inject 4 Units into the skin 3 (three) times daily before meals. 08/18/14  Yes Waylan Boga, NP  Insulin Glargine (LANTUS) 100 UNIT/ML Solostar Pen Inject 30 Units into the skin at bedtime. 08/18/14  Yes Waylan Boga, NP  ipratropium-albuterol (DUONEB) 0.5-2.5 (3) MG/3ML SOLN Take 3 mLs by nebulization every 4 (four) hours as needed (shortness of breath). 08/18/14  Yes Waylan Boga, NP  isosorbide mononitrate (IMDUR) 30 MG 24 hr tablet Take 1 tablet (30 mg total) by mouth daily. 08/18/14  Yes Waylan Boga, NP  lisinopril (PRINIVIL,ZESTRIL) 10 MG tablet Take 0.5 tablets (5 mg total) by mouth daily. 08/18/14  Yes Waylan Boga, NP  metFORMIN (GLUCOPHAGE) 1000 MG tablet Take 1 tablet (1,000 mg total) by mouth 2 (two) times daily with a meal. 08/18/14  Yes Waylan Boga, NP  methocarbamol (ROBAXIN) 500 MG tablet Take 1 tablet (500 mg total) by mouth every 6 (six) hours as needed for  muscle spasms (or pain). 08/18/14  Yes Waylan Boga, NP  metoprolol succinate (TOPROL-XL) 25 MG 24 hr tablet Take 1 tablet by mouth daily. 08/25/14  Yes Historical Provider, MD  nitroGLYCERIN (NITROSTAT) 0.4 MG SL tablet Place 1 tablet (0.4 mg total) under the tongue every 5 (five) minutes as needed for chest pain. 08/18/14  Yes Waylan Boga, NP  pravastatin (PRAVACHOL) 40 MG tablet Take 1 tablet (40 mg total) by mouth daily. 08/18/14  Yes Waylan Boga, NP  cephALEXin (KEFLEX) 500 MG capsule Take 1 capsule (500 mg total) by mouth 2 (two) times daily. Patient not taking: Reported on 09/01/2014 08/19/14   Everlene Balls, MD  cyclobenzaprine (FEXMID) 7.5 MG tablet Take  1 tablet (7.5 mg total) by mouth 3 (three) times daily as needed for muscle spasms. 09/01/14   Debby Freiberg, MD  oxyCODONE-acetaminophen (PERCOCET) 7.5-325 MG per tablet Take 1-2 tablets by mouth every 4 (four) hours as needed for pain. Patient not taking: Reported on 09/01/2014 08/18/14   Waylan Boga, NP  traMADol (ULTRAM) 50 MG tablet Take 1 tablet (50 mg total) by mouth every 6 (six) hours as needed for moderate pain. 08/18/14   Waylan Boga, NP  traMADol (ULTRAM) 50 MG tablet Take 1 tablet (50 mg total) by mouth every 12 (twelve) hours as needed for severe pain. Patient not taking: Reported on 08/19/2014 08/19/14   Everlene Balls, MD   BP 129/76 mmHg  Pulse 82  Temp(Src) 97.6 F (36.4 C) (Oral)  Resp 16  SpO2 99% Physical Exam  Constitutional: He is oriented to person, place, and time. He appears well-developed and well-nourished.  HENT:  Head: Normocephalic and atraumatic.  Eyes: Conjunctivae and EOM are normal.  Neck: Normal range of motion. Neck supple.  Cardiovascular: Normal rate, regular rhythm and normal heart sounds.   Pulmonary/Chest: Effort normal. No respiratory distress. He has wheezes (bil end expiratory).  Abdominal: He exhibits no distension. There is no tenderness. There is no rebound and no guarding.   Musculoskeletal: Normal range of motion.  Neurological: He is alert and oriented to person, place, and time.  Skin: Skin is warm and dry.  Vitals reviewed.   ED Course  Procedures (including critical care time) Labs Review Labs Reviewed  CBG MONITORING, ED - Abnormal; Notable for the following:    Glucose-Capillary 172 (*)    All other components within normal limits    Imaging Review No results found.   EKG Interpretation None      MDM   Final diagnoses:  Tingling    63 y.o. male with pertinent PMH of chronic neck and back pain with planned NSU intervention and fu, DM, HIV, cocaine use, CAD presents with generalized tingling and itching occurring shortly after being discharged earlier. Patient was seen earlier same day for his chronic neck and back pain, was given Dilaudid. He has had Dilaudid and other narcotics in the past without reaction. The patient went to the waiting room, was unable to reach his son for a ride back to the homeless shelter, went to sleep. On awakening the patient had generalized tingling and itching. He has no rash. He does feel short of breath. No chest pain. The patient has bilateral end expiratory wheezing on exam.  Benadryl and albuterol given. Symptoms improved.  Monitored pt for approximately 2 hours without change in symptoms, appearance of urticaria, dyspnea, or gi symptoms.  No chest pain, other symptoms reported.  Likely histaminergic reaction to dilaudid.  DC home in stable condition.    1. Tingling         Debby Freiberg, MD 09/01/14 [phone]

## 2014-09-06 ENCOUNTER — Other Ambulatory Visit: Payer: Medicare Other

## 2014-09-06 DIAGNOSIS — Z21 Asymptomatic human immunodeficiency virus [HIV] infection status: Secondary | ICD-10-CM

## 2014-09-06 DIAGNOSIS — B20 Human immunodeficiency virus [HIV] disease: Secondary | ICD-10-CM

## 2014-09-07 LAB — HIV-1 RNA QUANT-NO REFLEX-BLD
HIV 1 RNA Quant: 83 copies/mL — ABNORMAL HIGH (ref <20)
HIV-1 RNA Quant, Log: 1.92 {Log} — ABNORMAL HIGH (ref <1.30)

## 2014-09-07 LAB — T-HELPER CELL (CD4) - (RCID CLINIC ONLY)
CD4 % Helper T Cell: 36 % (ref 33–55)
CD4 T Cell Abs: 570 /uL (ref 400–2700)

## 2014-09-14 ENCOUNTER — Encounter: Payer: Self-pay | Admitting: Internal Medicine

## 2014-09-22 ENCOUNTER — Ambulatory Visit: Payer: Self-pay | Admitting: Infectious Diseases

## 2014-10-19 ENCOUNTER — Emergency Department (HOSPITAL_COMMUNITY)
Admission: EM | Admit: 2014-10-19 | Discharge: 2014-10-19 | Disposition: A | Discharge status: Home / Self Care | Payer: 59 | Attending: Emergency Medicine | Admitting: Emergency Medicine

## 2014-10-19 ENCOUNTER — Encounter (HOSPITAL_COMMUNITY): Payer: Self-pay | Admitting: Emergency Medicine

## 2014-10-19 DIAGNOSIS — J45909 Unspecified asthma, uncomplicated: Secondary | ICD-10-CM | POA: Diagnosis not present

## 2014-10-19 DIAGNOSIS — E785 Hyperlipidemia, unspecified: Secondary | ICD-10-CM | POA: Diagnosis not present

## 2014-10-19 DIAGNOSIS — I2510 Atherosclerotic heart disease of native coronary artery without angina pectoris: Secondary | ICD-10-CM | POA: Insufficient documentation

## 2014-10-19 DIAGNOSIS — R251 Tremor, unspecified: Secondary | ICD-10-CM | POA: Insufficient documentation

## 2014-10-19 DIAGNOSIS — E119 Type 2 diabetes mellitus without complications: Secondary | ICD-10-CM | POA: Diagnosis not present

## 2014-10-19 DIAGNOSIS — F329 Major depressive disorder, single episode, unspecified: Secondary | ICD-10-CM | POA: Diagnosis not present

## 2014-10-19 DIAGNOSIS — F1410 Cocaine abuse, uncomplicated: Secondary | ICD-10-CM | POA: Diagnosis not present

## 2014-10-19 DIAGNOSIS — Z791 Long term (current) use of non-steroidal anti-inflammatories (NSAID): Secondary | ICD-10-CM | POA: Insufficient documentation

## 2014-10-19 DIAGNOSIS — Z79899 Other long term (current) drug therapy: Secondary | ICD-10-CM | POA: Insufficient documentation

## 2014-10-19 DIAGNOSIS — Z9861 Coronary angioplasty status: Secondary | ICD-10-CM | POA: Insufficient documentation

## 2014-10-19 DIAGNOSIS — Z792 Long term (current) use of antibiotics: Secondary | ICD-10-CM | POA: Diagnosis not present

## 2014-10-19 DIAGNOSIS — F14220 Cocaine dependence with intoxication, uncomplicated: Secondary | ICD-10-CM | POA: Diagnosis not present

## 2014-10-19 DIAGNOSIS — Z79891 Long term (current) use of opiate analgesic: Secondary | ICD-10-CM | POA: Insufficient documentation

## 2014-10-19 DIAGNOSIS — M109 Gout, unspecified: Secondary | ICD-10-CM | POA: Insufficient documentation

## 2014-10-19 DIAGNOSIS — Z7982 Long term (current) use of aspirin: Secondary | ICD-10-CM | POA: Diagnosis not present

## 2014-10-19 DIAGNOSIS — Z794 Long term (current) use of insulin: Secondary | ICD-10-CM | POA: Diagnosis not present

## 2014-10-19 DIAGNOSIS — Z872 Personal history of diseases of the skin and subcutaneous tissue: Secondary | ICD-10-CM | POA: Diagnosis not present

## 2014-10-19 DIAGNOSIS — Z21 Asymptomatic human immunodeficiency virus [HIV] infection status: Secondary | ICD-10-CM | POA: Insufficient documentation

## 2014-10-19 DIAGNOSIS — I10 Essential (primary) hypertension: Secondary | ICD-10-CM | POA: Insufficient documentation

## 2014-10-19 DIAGNOSIS — R0602 Shortness of breath: Secondary | ICD-10-CM | POA: Diagnosis not present

## 2014-10-19 LAB — ETHANOL: Alcohol, Ethyl (B): 40 mg/dL — ABNORMAL HIGH (ref 0–9)

## 2014-10-19 LAB — CBC WITH DIFFERENTIAL/PLATELET
Basophils Absolute: 0 10*3/uL (ref 0.0–0.1)
Basophils Relative: 0 % (ref 0–1)
Eosinophils Absolute: 0.1 10*3/uL (ref 0.0–0.7)
Eosinophils Relative: 2 % (ref 0–5)
HEMATOCRIT: 34.9 % — AB (ref 39.0–52.0)
Hemoglobin: 11.4 g/dL — ABNORMAL LOW (ref 13.0–17.0)
LYMPHS PCT: 36 % (ref 12–46)
Lymphs Abs: 1.4 10*3/uL (ref 0.7–4.0)
MCH: 26.2 pg (ref 26.0–34.0)
MCHC: 32.7 g/dL (ref 30.0–36.0)
MCV: 80.2 fL (ref 78.0–100.0)
Monocytes Absolute: 0.3 10*3/uL (ref 0.1–1.0)
Monocytes Relative: 8 % (ref 3–12)
NEUTROS ABS: 2.2 10*3/uL (ref 1.7–7.7)
NEUTROS PCT: 54 % (ref 43–77)
Platelets: 187 10*3/uL (ref 150–400)
RBC: 4.35 MIL/uL (ref 4.22–5.81)
RDW: 13.5 % (ref 11.5–15.5)
WBC: 4 10*3/uL (ref 4.0–10.5)

## 2014-10-19 LAB — COMPREHENSIVE METABOLIC PANEL
ALK PHOS: 58 U/L (ref 39–117)
ALT: 13 U/L (ref 0–53)
AST: 18 U/L (ref 0–37)
Albumin: 4 g/dL (ref 3.5–5.2)
Anion gap: 17 — ABNORMAL HIGH (ref 5–15)
BILIRUBIN TOTAL: 0.7 mg/dL (ref 0.3–1.2)
BUN: 16 mg/dL (ref 6–23)
CHLORIDE: 100 meq/L (ref 96–112)
CO2: 22 mmol/L (ref 19–32)
Calcium: 9.9 mg/dL (ref 8.4–10.5)
Creatinine, Ser: 1.36 mg/dL — ABNORMAL HIGH (ref 0.50–1.35)
GFR calc Af Amer: 62 mL/min — ABNORMAL LOW (ref >90)
GFR, EST NON AFRICAN AMERICAN: 53 mL/min — AB (ref >90)
Glucose, Bld: 273 mg/dL — ABNORMAL HIGH (ref 70–99)
POTASSIUM: 3.5 mmol/L (ref 3.5–5.1)
Sodium: 139 mmol/L (ref 135–145)
Total Protein: 7.6 g/dL (ref 6.0–8.3)

## 2014-10-19 NOTE — ED Notes (Signed)
Pt. wearing paper scrubs and wanded by security at triage .

## 2014-10-19 NOTE — ED Provider Notes (Signed)
CSN: [id number]     Arrival date & time 10/19/14  1944 History  This chart was scribed for non-physician practitioner working with Orlie Dakin, MD by Molli Posey, ED Scribe. This patient was seen in room TR09C/TR09C and the patient's care was started at 10:40 PM.      Chief Complaint  Patient presents with  . Addiction Problem   The history is provided by the patient. No language interpreter was used.   HPI Comments: Michael [name] is a 64 y.o. male with a history of DM, HTN, CAD, HIV and depression who presents to the Emergency Department because he smoked "a lot" of crack cocaine 4 days ago, and this morning and wants to quit. He reports no other drug use or alcohol use. He states it has been "a while" since he has been in a detox program. Pt states that sometimes he will find himself "shaking and screaming" at home. Pt states he is SOB but says that he is SOB at his baseline. He denies SI or HI. He denies CP.    Past Medical History  Diagnosis Date  . Diabetes mellitus     Diagnosed in 2002, started insulin in 2012  . Hypertension   . Headache(784.0)     CT head 08/2011: Periventricular and subcortical white matter hypodensities are most in keeping with chronic microangiopathic change  . Gout   . Hyperlipidemia   . HIV infection Nov 2012    Followed by [name]  . Crack cocaine use     for 20+ years, has been enrolled in detox programs in the past  . Chondromalacia of medial femoral condyle     Left knee MRI 04/28/12: Chondromalacia of the medial femoral condyle with slight peripheral degeneration of the meniscocapsular junction of the medial meniscus; followed by sports medicine  . Asthma     No PFTs, history of childhood asthma  . Depression     with history of hospitalization for suicidal ideation  . CAD (coronary artery disease)     a. 06/2013 STEMI/PCI ([facility]): LAD w/ thrombus (treated with BMS), mid 75%, D2 75%; LCX OM2 75%; RCA small, PDA 95%, PLV 95%;  b. 10/2013  Cath/PCI: ISR w/in LAD (Promus DES x 2), borderline OM2 lesion;  c. 01/2014 MV: Intermediate risk, medium-sized distal ant wall infarct w/ very small amt of peri-infarct ischemia. EF 60%.  . Gout 04/28/2012  . Collagen vascular disease   . Cellulitis 04/2014    left facial   Past Surgical History  Procedure Laterality Date  . Prostate surgery    . Bowel resection    . Back surgery      1988  . Cardiac surgery    . Cervical spine surgery      " rods in my neck "  . Coronary stent placement    . Nm myocar perf wall motion  12/27/2011    normal   Family History  Problem Relation Age of Onset  . Diabetes Mother   . Diabetes Father   . Diabetes Brother   . Colon cancer Neg Hx    History  Substance Use Topics  . Smoking status: Never Smoker   . Smokeless tobacco: Never Used  . Alcohol Use: No    Review of Systems  A complete 10 system review of systems was obtained and all systems are negative except as noted in the HPI and PMH.    Allergies  Review of patient's allergies indicates no known allergies.  Home Medications   Prior to Admission medications   Medication Sig Start Date End Date Taking? Authorizing Provider  albuterol (PROVENTIL HFA;VENTOLIN HFA) 108 (90 BASE) MCG/ACT inhaler Inhale 2 puffs into the lungs every 6 (six) hours as needed for wheezing or shortness of breath.    Historical Provider, MD  allopurinol (ZYLOPRIM) 100 MG tablet Take 2 tablets (200 mg total) by mouth daily. 08/18/14   Waylan Boga, NP  aspirin EC 81 MG tablet Take 1 tablet (81 mg total) by mouth daily. 08/18/14   Waylan Boga, NP  cephALEXin (KEFLEX) 500 MG capsule Take 1 capsule (500 mg total) by mouth 2 (two) times daily. Patient not taking: Reported on 09/01/2014 08/19/14   Everlene Balls, MD  clopidogrel (PLAVIX) 75 MG tablet Take 1 tablet (75 mg total) by mouth daily. 08/18/14   Waylan Boga, NP  cyclobenzaprine (FEXMID) 7.5 MG tablet Take 1 tablet (7.5 mg total) by mouth 3 (three) times  daily as needed for muscle spasms. 09/01/14   Debby Freiberg, MD  diclofenac (VOLTAREN) 25 MG EC tablet Take 1 tablet (25 mg total) by mouth 3 (three) times daily as needed for mild pain. 08/18/14   Waylan Boga, NP  diclofenac sodium (VOLTAREN) 1 % GEL Apply 2 g topically daily as needed (pain). 08/18/14   Waylan Boga, NP  diltiazem (DILACOR XR) 120 MG 24 hr capsule Take 1 capsule (120 mg total) by mouth daily. 08/18/14   Waylan Boga, NP  DULoxetine (CYMBALTA) 60 MG capsule Take 1 capsule (60 mg total) by mouth daily. 08/18/14   Waylan Boga, NP  Emtricitab-Rilpivir-Tenofovir (COMPLERA) 200-25-300 MG TABS Take 1 tablet by mouth daily. 08/18/14   Waylan Boga, NP  famotidine (PEPCID) 20 MG tablet Take 1 tablet (20 mg total) by mouth daily as needed for heartburn. 08/18/14   Waylan Boga, NP  fentaNYL (DURAGESIC) 12 MCG/HR Place 1 patch (12.5 mcg total) onto the skin every 3 (three) days. 08/18/14   Waylan Boga, NP  gabapentin (NEURONTIN) 300 MG capsule Take 1 capsule (300 mg total) by mouth 3 (three) times daily. 08/18/14   Waylan Boga, NP  ibuprofen (ADVIL,MOTRIN) 600 MG tablet Take 1 tablet (600 mg total) by mouth every 6 (six) hours as needed. Patient taking differently: Take 600 mg by mouth every 6 (six) hours as needed for moderate pain.  08/18/14   Waylan Boga, NP  insulin aspart (NOVOLOG) 100 UNIT/ML injection Inject 4 Units into the skin 3 (three) times daily before meals. 08/18/14   Waylan Boga, NP  Insulin Glargine (LANTUS) 100 UNIT/ML Solostar Pen Inject 30 Units into the skin at bedtime. 08/18/14   Waylan Boga, NP  ipratropium-albuterol (DUONEB) 0.5-2.5 (3) MG/3ML SOLN Take 3 mLs by nebulization every 4 (four) hours as needed (shortness of breath). 08/18/14   Waylan Boga, NP  isosorbide mononitrate (IMDUR) 30 MG 24 hr tablet Take 1 tablet (30 mg total) by mouth daily. 08/18/14   Waylan Boga, NP  lisinopril (PRINIVIL,ZESTRIL) 10 MG tablet Take 0.5 tablets (5 mg total) by mouth  daily. 08/18/14   Waylan Boga, NP  metFORMIN (GLUCOPHAGE) 1000 MG tablet Take 1 tablet (1,000 mg total) by mouth 2 (two) times daily with a meal. 08/18/14   Waylan Boga, NP  methocarbamol (ROBAXIN) 500 MG tablet Take 1 tablet (500 mg total) by mouth every 6 (six) hours as needed for muscle spasms (or pain). 08/18/14   Waylan Boga, NP  metoprolol succinate (TOPROL-XL) 25 MG 24 hr tablet Take 1 tablet by mouth daily.  08/25/14   Historical Provider, MD  nitroGLYCERIN (NITROSTAT) 0.4 MG SL tablet Place 1 tablet (0.4 mg total) under the tongue every 5 (five) minutes as needed for chest pain. 08/18/14   Waylan Boga, NP  oxyCODONE-acetaminophen (PERCOCET) 7.5-325 MG per tablet Take 1-2 tablets by mouth every 4 (four) hours as needed for pain. Patient not taking: Reported on 09/01/2014 08/18/14   Waylan Boga, NP  pravastatin (PRAVACHOL) 40 MG tablet Take 1 tablet (40 mg total) by mouth daily. 08/18/14   Waylan Boga, NP  traMADol (ULTRAM) 50 MG tablet Take 1 tablet (50 mg total) by mouth every 6 (six) hours as needed for moderate pain. 08/18/14   Waylan Boga, NP  traMADol (ULTRAM) 50 MG tablet Take 1 tablet (50 mg total) by mouth every 12 (twelve) hours as needed for severe pain. Patient not taking: Reported on 08/19/2014 08/19/14   Everlene Balls, MD   BP 122/68 mmHg  Pulse 102  Temp(Src) 98.4 F (36.9 C) (Oral)  Resp 14  Ht 5\' 8"  (1.727 m)  Wt 176 lb (79.833 kg)  BMI 26.77 kg/m2  SpO2 98% Physical Exam  Constitutional: He is oriented to person, place, and time. He appears well-developed and well-nourished.  HENT:  Head: Normocephalic and atraumatic.  Mouth/Throat: Oropharynx is clear and moist.  Eyes: Conjunctivae are normal. Pupils are equal, round, and reactive to light. Right eye exhibits no discharge. Left eye exhibits no discharge.  Neck: Normal range of motion. Neck supple. No tracheal deviation present.  Cardiovascular: Normal rate, regular rhythm and intact distal pulses.    Pulmonary/Chest: Effort normal and breath sounds normal. No respiratory distress. He has no wheezes. He has no rales. He exhibits no tenderness.  Abdominal: Soft. Bowel sounds are normal. He exhibits no distension and no mass. There is no tenderness. There is no rebound and no guarding.  Musculoskeletal: He exhibits no edema or tenderness.  Neurological: He is alert and oriented to person, place, and time.  Skin: Skin is warm and dry.  Psychiatric: He has a normal mood and affect. His behavior is normal.  Nursing note and vitals reviewed.   ED Course  Procedures   DIAGNOSTIC STUDIES: Oxygen Saturation is 98% on RA, normal by my interpretation.    COORDINATION OF CARE: 10:44 PM Discussed treatment plan with pt at bedside and pt agreed to plan.   Labs Review Labs Reviewed  ETHANOL - Abnormal; Notable for the following:    Alcohol, Ethyl (B) 40 (*)    All other components within normal limits  CBC WITH DIFFERENTIAL - Abnormal; Notable for the following:    Hemoglobin 11.4 (*)    HCT 34.9 (*)    All other components within normal limits  COMPREHENSIVE METABOLIC PANEL - Abnormal; Notable for the following:    Glucose, Bld 273 (*)    Creatinine, Ser 1.36 (*)    GFR calc non Af Amer 53 (*)    GFR calc Af Amer 62 (*)    Anion gap 17 (*)    All other components within normal limits  URINE RAPID DRUG SCREEN (HOSP PERFORMED)    Imaging Review No results found.   EKG Interpretation None      MDM   Final diagnoses:  Cocaine abuse    Filed Vitals:   10/19/14 2008 10/19/14 2319  BP: 122/68 121/69  Pulse: 102 99  Temp: 98.4 F (36.9 C) 97.4 F (36.3 C)  TempSrc: Oral Oral  Resp: 14 18  Height: 5\' 8"  (1.727 m)  Weight: 176 lb (79.833 kg)   SpO2: 98% 99%    JAYKE MONACHINO is a pleasant 64 y.o. male requesting detox from crack cocaine. No other substance abuse. Patient has history of CAD however he is reporting no chest pain or shortness of breath above his baseline  today. Advised patient to use a resource guide to establish outpatient crack cocaine detoxes we cannot offer this in the hospital. No SI, HI, auditory or visual hallucinations or other indications for inpatient psychiatric care.  Evaluation does not show pathology that would require ongoing emergent intervention or inpatient treatment. Pt is hemodynamically stable and mentating appropriately. Discussed findings and plan with patient/guardian, who agrees with care plan. All questions answered. Return precautions discussed and outpatient follow up given.    I personally performed the services described in this documentation, which was scribed in my presence. The recorded information has been reviewed and is accurate.      Monico Blitz, PA-C 10/20/14 [place], MD 10/22/14 [phone]

## 2014-10-19 NOTE — Discharge Instructions (Signed)
Do not hesitate to return to the emergency room for any new, worsening or concerning symptoms. ° °Please obtain primary care using resource guide below. But the minute you were seen in the emergency room and that they will need to obtain records for further outpatient management. ° ° ° °Emergency Department Resource Guide °1) Find a Doctor and Pay Out of Pocket °Although you won't have to find out who is covered by your insurance plan, it is a good idea to ask around and get recommendations. You will then need to call the office and see if the doctor you have chosen will accept you as a new patient and what types of options they offer for patients who are self-pay. Some doctors offer discounts or will set up payment plans for their patients who do not have insurance, but you will need to ask so you aren't surprised when you get to your appointment. ° °2) Contact Your Local Health Department °Not all health departments have doctors that can see patients for sick visits, but many do, so it is worth a call to see if yours does. If you don't know where your local health department is, you can check in your phone book. The CDC also has a tool to help you locate your state's health department, and many state websites also have listings of all of their local health departments. ° °3) Find a Walk-in Clinic °If your illness is not likely to be very severe or complicated, you may want to try a walk in clinic. These are popping up all over the country in pharmacies, drugstores, and shopping centers. They're usually staffed by nurse practitioners or physician assistants that have been trained to treat common illnesses and complaints. They're usually fairly quick and inexpensive. However, if you have serious medical issues or chronic medical problems, these are probably not your best option. ° °No Primary Care Doctor: °- Call [facility] at  832-8000 - they can help you locate a primary care doctor that  accepts your  insurance, provides certain services, etc. °- Physician Referral Service- 1-800-533-3463 ° °Chronic Pain Problems: °Organization         Address  Phone   Notes  °[facility]  (336) 297-2271 Patients need to be referred by their primary care doctor.  ° °Medication Assistance: °Organization         Address  Phone   Notes  °[place] [facility] [address] °[place], [place] [address] (336) 641-8030 --Must be a resident of [place] °-- Must have NO insurance coverage whatsoever (no Medicaid/ Medicare, etc.) °-- The pt. MUST have a primary care doctor that directs their care regularly and follows them in the community °  °[facility]  (866) 331-1348   °[employer]  (888) 892-1162   ° °Agencies that provide inexpensive medical care: °Organization         Address  Phone   Notes  °[facility]  (336) 832-8035   ° Internal Medicine    (336) 832-7272   °[facility] [address] °[place], [place] [address] (336) 832-4777   °[facility] [address], °[place] (336) 271-4999   °[facility]    (336) 373-0678   °[facility]    (336) 272-1050   °[facility] ° [address], [place] Phone:  (336) 832-4444, Fax:  (336) 832-4440 Hours of Operation:  9 am -   6 pm, M-F.  Also accepts Medicaid/Medicare and self-pay.  °[facility] ° [address], [place] Phone: (336) [phone], Fax: (336) [phone]. Hours of Operation:  8:30 am - 5:30 pm, M-F.  Also accepts Medicaid and self-pay.  °[facility] [place] [address], [place] Phone: (336) 878-6027   °[facility] [address], [place], [place] (336)723-1848, Ext. 123 Mondays & Thursdays: 7-9 AM.  First 15 patients are seen on a first come, first serve basis. °  ° °Medicaid-accepting [place] Providers: ° °Organization          Address  Phone   Notes  °[facility] [address], [place] (336) 641-2100 Also accepts self-pay patients.  °[facility] [address], [place] ° (336) 856-9996   °[facility] [address], [place] (336) 288-8857   °[facility] [address] [place] [address], [place] (336) 299-7000   °Veita Bland [address], [place]  ° (336) 373-1557 Only accepts [place] Access Medicaid patients after they have their name applied to their card.  ° °Self-Pay (no insurance) in [place]: ° °Organization         Address  Phone   Notes  °Sickle Cell Patients, [facility] [address], [place] (336) 832-1970   °[facility] [address], [place] (336) 832-4400   °[facility] ° [address], [place] (336) 992-4800   °[facility]/Dr. Osei-Bonsu ° [address] [place] [address], [name] or [address], [place] (336) [phone] Phone number for both [place] and [place] locations is the same.  °[facility] [address], [place] (336) 299-0000   °[facility] [address] [place] [address], [place] or [address] (336) 852-7530 °(336) 878-2260   °[facility] [address], [place] (336) 350-1642, phone; (336) 294-5005, fax Sees patients 1st and 3rd Saturday of every month.  Must not qualify for public or private insurance (i.e. Medicaid, Medicare, [place] Health Choice, Veterans' Benefits) • Household income should be no more than 200% of the poverty level •The clinic cannot treat you if you are pregnant or think you are pregnant • Sexually transmitted diseases are not treated at the clinic.  ° ° °Dental Care: °Organization         Address  Phone  Notes  °[facility] [facility] [address],  [place] (336) 641-6152 Accepts children up to age 21 who are enrolled in Medicaid or [place] Health Choice; pregnant women with a Medicaid card; and children who have applied for Medicaid or [place] Health Choice, but were declined, whose parents can pay a reduced fee at time of service.  °[facility] [place]  [address], [place] (336) 641-7733 Accepts children up to age 21 who are enrolled in Medicaid or [place] Health Choice; pregnant women with a Medicaid card; and children who have applied for Medicaid or [place] Health Choice, but were declined, whose parents can pay a reduced fee at time of service.  °[facility] ° [address], [place] (336) 641-4533 Patients are seen by appointment only. Walk-ins are not accepted. [facility] will see patients 18 years of age and   older. °Monday - Tuesday (8am-5pm) °Most Wednesdays (8:30-5pm) °$30 per visit, cash only  °[facility] ° [address], [place] (336) 641-4533 Patients are seen by appointment only. Walk-ins are not accepted. [facility] will see patients 18 years of age and older. °One Wednesday Evening (Monthly: Volunteer Based).  $30 per visit, cash only  °[facility]  (919) 537-3737 for adults; Children under age 4, call Graduate Pediatric Dentistry at (919) [phone]. Children aged 4-14, please call (919) [phone] to request a pediatric application. ° Dental services are provided in all areas of dental care including fillings, crowns and bridges, complete and partial dentures, implants, gum treatment, root canals, and extractions. Preventive care is also provided. Treatment is provided to both adults and children. °Patients are selected via a lottery and there is often a waiting list. °  °[facility] [address], °[place] ° (336) 763-8833 [email or web address] °  °[facility] [address], [place], [place]  (336)723-1848, Ext. 123 Second and Fourth Thursday of each month, opens at 6:30 AM; Clinic ends at 9 AM.  Patients are seen on a first-come first-served basis, and a limited number are seen during each clinic.  ° °[facility] ° [address], [place], [place] (336) 723-7904   Eligibility Requirements °You must have lived in [place], [place], or [place] counties for at least the last three months. °  You cannot be eligible for state or federal sponsored healthcare insurance, including Veterans Administration, Medicaid, or Medicare. °  You generally cannot be eligible for healthcare insurance through your employer.  °  How to apply: °Eligibility screenings are held every Tuesday and Wednesday afternoon from 1:00 pm until 4:00 pm. You do not need an appointment for the interview!  °[facility] [address], [place], [place]  336-631-2330   °[facility]  336-342-8273   °[facility]  336-703-3100   °[facility]  336-570-6415   ° °Behavioral Health Resources in the Community: °Intensive Outpatient Programs °Organization         Address  Phone  Notes  °[facility] [address], [place], [place] 336-878-6098   °[facility] [address], [place], [place] 336-832-9800   °[facility] [address], [place], [place] ° 336-882-2125   °[facility] [address],  °[place], [place] 1-800-853-5163 or 336-641-4981   °Substance Abuse Resources °Organization         Address  Phone  Notes  °[facility]  336-882-2125   °[facility]  336-784-9470   °[facility]  336-285-9073   °[facility]  336-845-3988   °Residential & Outpatient Substance Abuse Program  1-800-659-3381   °Psychological Services °Organization         Address  Phone  Notes  °[facility]  336- 832-9600   °[facility]  336- 378-7881    °[facility] [address], [place] 1-800-853-5163 or 336-641-4981   ° °Mobile Crisis Teams °Organization         Address  Phone  Notes  °[facility], Mobile Crisis Care Unit  1-877-626-1772   °Assertive °[facility] ° [address] [place], [place] 336-834-9664   °Sharon DeEsch [address] °[place]  336-554-5454   ° °Self-Help/Support Groups °Organization         Address    Phone             Notes  °[facility] - variety of support groups  336- 373-1402 Call for more information  °Narcotics Anonymous (NA), [facility] [address], °[place]  2 meetings at this location  ° °Residential Treatment Programs °Organization         Address  Phone  Notes  °[facility] [address],    °[place]  1-866-801-8205   °[facility] ° [address], [place], [place] 704-293-8524   °[facility] [address], [place] 336-845-3988 Admissions: 8am-3pm M-F  °[facility] [address]    °[place], [place] 336-841-1104   °[facility] [address], [place], [place] 336-379-7146   °[facility] [address]  °[place], [place] 336-285-9073   °[facility] Intensive Outpatient [address], [place], [place] 336-852-3033   °[facility] [address]  °[place], [place] 1-877-615-2722 or 336-784-9470   °[facility] [address] [place], [place] 336-227-7417 Accepts Medicaid  °[facility] [address]  °[place] 1-800-659-3381 Substance Abuse/Addiction Treatment  ° °[place] Behavioral Health Resources °Organization         Address  Phone  Notes  °[facility]  (888) 581-9988   °Julie Brannon, PhD [address] [place], [place]   (336) 349-5553 or (336) 951-0000   °[facility]   [address] °[place], [place] (336) 349-4454   °[facility] [address], [place], [place] (336) 342-8316 Insurance/Medicaid/sponsorship through [facility]  °[facility] [address]                                    [place], [place] (336) 342-8316 Therapy/tele-psych/case  °[facility] [address]  ° [place], [place] (336) 349-2233    °Dr. Arfeen  (336) 349-4544   °[facility] [place]  [employer] [place] [facility] 1) [address], [place] °2) [address], [place] °3)  [address], [place] (336) 349-3220 °(336) 342-7768 ° °(336) 342-8140   °[place] [facility] (336) 342-1394 or (336) 342-3537 (After Hours)    ° ° ° °

## 2014-10-19 NOTE — ED Notes (Signed)
Pt. requesting detox from his Crack Cocaine addiction , last intake today , denies suicidal ideation / no hallucinations . Respirations unlabored , calm and cooperative at triage .

## 2014-10-20 ENCOUNTER — Other Ambulatory Visit: Payer: Self-pay | Admitting: *Deleted

## 2014-10-20 MED ORDER — INSULIN ASPART 100 UNIT/ML ~~LOC~~ SOLN: 4.0000 [IU] | Freq: Three times a day (TID) | SUBCUTANEOUS | Status: DC

## 2014-10-20 NOTE — Telephone Encounter (Signed)
Refill request states 4units before brkfast and lunch, 8units before dinner

## 2014-11-02 DIAGNOSIS — J45909 Unspecified asthma, uncomplicated: Secondary | ICD-10-CM | POA: Diagnosis not present

## 2014-11-09 ENCOUNTER — Encounter: Payer: Self-pay | Admitting: *Deleted

## 2014-11-10 ENCOUNTER — Ambulatory Visit: Payer: Self-pay | Admitting: Infectious Diseases

## 2014-11-16 ENCOUNTER — Ambulatory Visit: Payer: Self-pay | Admitting: Infectious Diseases

## 2014-11-18 ENCOUNTER — Other Ambulatory Visit: Payer: Self-pay | Admitting: Infectious Diseases

## 2014-11-22 ENCOUNTER — Other Ambulatory Visit: Payer: Self-pay | Admitting: Internal Medicine

## 2014-11-29 NOTE — Telephone Encounter (Signed)
Has March appt PCP

## 2014-11-30 ENCOUNTER — Other Ambulatory Visit: Payer: Self-pay | Admitting: *Deleted

## 2014-12-01 MED ORDER — ALBUTEROL SULFATE HFA 108 (90 BASE) MCG/ACT IN AERS: 2.0000 | INHALATION_SPRAY | Freq: Four times a day (QID) | RESPIRATORY_TRACT | Status: DC | PRN

## 2014-12-01 MED ORDER — DULOXETINE HCL 60 MG PO CPEP: 60.0000 mg | ORAL_CAPSULE | Freq: Every day | ORAL | Status: DC

## 2014-12-07 ENCOUNTER — Encounter: Payer: Self-pay | Admitting: Internal Medicine

## 2014-12-10 ENCOUNTER — Emergency Department (HOSPITAL_COMMUNITY)
Admission: EM | Admit: 2014-12-10 | Discharge: 2014-12-12 | Disposition: A | Discharge status: Home / Self Care | Payer: Medicare Other | Attending: Emergency Medicine | Admitting: Emergency Medicine

## 2014-12-10 ENCOUNTER — Emergency Department (HOSPITAL_COMMUNITY): Payer: Medicare Other

## 2014-12-10 DIAGNOSIS — Z794 Long term (current) use of insulin: Secondary | ICD-10-CM | POA: Diagnosis not present

## 2014-12-10 DIAGNOSIS — B20 Human immunodeficiency virus [HIV] disease: Secondary | ICD-10-CM | POA: Insufficient documentation

## 2014-12-10 DIAGNOSIS — Z792 Long term (current) use of antibiotics: Secondary | ICD-10-CM | POA: Diagnosis not present

## 2014-12-10 DIAGNOSIS — Z79899 Other long term (current) drug therapy: Secondary | ICD-10-CM | POA: Insufficient documentation

## 2014-12-10 DIAGNOSIS — E119 Type 2 diabetes mellitus without complications: Secondary | ICD-10-CM | POA: Insufficient documentation

## 2014-12-10 DIAGNOSIS — R05 Cough: Secondary | ICD-10-CM | POA: Insufficient documentation

## 2014-12-10 DIAGNOSIS — F329 Major depressive disorder, single episode, unspecified: Secondary | ICD-10-CM | POA: Diagnosis not present

## 2014-12-10 DIAGNOSIS — R0789 Other chest pain: Secondary | ICD-10-CM | POA: Diagnosis not present

## 2014-12-10 DIAGNOSIS — R45851 Suicidal ideations: Secondary | ICD-10-CM | POA: Diagnosis not present

## 2014-12-10 DIAGNOSIS — F333 Major depressive disorder, recurrent, severe with psychotic symptoms: Secondary | ICD-10-CM | POA: Diagnosis not present

## 2014-12-10 DIAGNOSIS — Z8659 Personal history of other mental and behavioral disorders: Secondary | ICD-10-CM | POA: Insufficient documentation

## 2014-12-10 DIAGNOSIS — E785 Hyperlipidemia, unspecified: Secondary | ICD-10-CM | POA: Diagnosis not present

## 2014-12-10 DIAGNOSIS — I10 Essential (primary) hypertension: Secondary | ICD-10-CM | POA: Diagnosis not present

## 2014-12-10 DIAGNOSIS — J45909 Unspecified asthma, uncomplicated: Secondary | ICD-10-CM | POA: Diagnosis not present

## 2014-12-10 DIAGNOSIS — Z7902 Long term (current) use of antithrombotics/antiplatelets: Secondary | ICD-10-CM | POA: Diagnosis not present

## 2014-12-10 DIAGNOSIS — F1420 Cocaine dependence, uncomplicated: Secondary | ICD-10-CM | POA: Diagnosis not present

## 2014-12-10 DIAGNOSIS — F1410 Cocaine abuse, uncomplicated: Secondary | ICD-10-CM

## 2014-12-10 DIAGNOSIS — Z872 Personal history of diseases of the skin and subcutaneous tissue: Secondary | ICD-10-CM | POA: Diagnosis not present

## 2014-12-10 DIAGNOSIS — M109 Gout, unspecified: Secondary | ICD-10-CM | POA: Insufficient documentation

## 2014-12-10 DIAGNOSIS — I2510 Atherosclerotic heart disease of native coronary artery without angina pectoris: Secondary | ICD-10-CM | POA: Insufficient documentation

## 2014-12-10 DIAGNOSIS — R0602 Shortness of breath: Secondary | ICD-10-CM | POA: Diagnosis not present

## 2014-12-10 DIAGNOSIS — Z046 Encounter for general psychiatric examination, requested by authority: Secondary | ICD-10-CM | POA: Diagnosis present

## 2014-12-10 DIAGNOSIS — F332 Major depressive disorder, recurrent severe without psychotic features: Secondary | ICD-10-CM | POA: Diagnosis not present

## 2014-12-10 DIAGNOSIS — Z7982 Long term (current) use of aspirin: Secondary | ICD-10-CM | POA: Insufficient documentation

## 2014-12-10 DIAGNOSIS — R059 Cough, unspecified: Secondary | ICD-10-CM

## 2014-12-10 LAB — ACETAMINOPHEN LEVEL: Acetaminophen (Tylenol), Serum: <10 ug/mL — ABNORMAL LOW (ref 10–30)

## 2014-12-10 LAB — COMPREHENSIVE METABOLIC PANEL
ALT: 24 U/L (ref 0–53)
AST: 53 U/L — AB (ref 0–37)
Albumin: 4.7 g/dL (ref 3.5–5.2)
Alkaline Phosphatase: 62 U/L (ref 39–117)
Anion gap: 8 (ref 5–15)
BILIRUBIN TOTAL: 1 mg/dL (ref 0.3–1.2)
BUN: 21 mg/dL (ref 6–23)
CHLORIDE: 105 mmol/L (ref 96–112)
CO2: 24 mmol/L (ref 19–32)
Calcium: 9.4 mg/dL (ref 8.4–10.5)
Creatinine, Ser: 1.36 mg/dL — ABNORMAL HIGH (ref 0.50–1.35)
GFR, EST AFRICAN AMERICAN: 62 mL/min — AB (ref >90)
GFR, EST NON AFRICAN AMERICAN: 53 mL/min — AB (ref >90)
Glucose, Bld: 225 mg/dL — ABNORMAL HIGH (ref 70–99)
Potassium: 4.1 mmol/L (ref 3.5–5.1)
SODIUM: 137 mmol/L (ref 135–145)
Total Protein: 7.9 g/dL (ref 6.0–8.3)

## 2014-12-10 LAB — ETHANOL: Alcohol, Ethyl (B): <5 mg/dL (ref 0–9)

## 2014-12-10 LAB — CBC
HEMATOCRIT: 37.2 % — AB (ref 39.0–52.0)
HEMOGLOBIN: 12.1 g/dL — AB (ref 13.0–17.0)
MCH: 26.7 pg (ref 26.0–34.0)
MCHC: 32.5 g/dL (ref 30.0–36.0)
MCV: 81.9 fL (ref 78.0–100.0)
PLATELETS: 173 10*3/uL (ref 150–400)
RBC: 4.54 MIL/uL (ref 4.22–5.81)
RDW: 14 % (ref 11.5–15.5)
WBC: 4.3 10*3/uL (ref 4.0–10.5)

## 2014-12-10 LAB — CBG MONITORING, ED
GLUCOSE-CAPILLARY: 126 mg/dL — AB (ref 70–99)
Glucose-Capillary: 183 mg/dL — ABNORMAL HIGH (ref 70–99)
Glucose-Capillary: 181 mg/dL — ABNORMAL HIGH (ref 70–99)

## 2014-12-10 LAB — RAPID URINE DRUG SCREEN, HOSP PERFORMED
AMPHETAMINES: NOT DETECTED
BENZODIAZEPINES: NOT DETECTED
Barbiturates: NOT DETECTED
COCAINE: POSITIVE — AB
OPIATES: NOT DETECTED
Tetrahydrocannabinol: NOT DETECTED

## 2014-12-10 LAB — I-STAT TROPONIN, ED: Troponin i, poc: 0.01 ng/mL (ref 0.00–0.08)

## 2014-12-10 LAB — SALICYLATE LEVEL

## 2014-12-10 MED ORDER — INSULIN GLARGINE 100 UNIT/ML ~~LOC~~ SOLN
30.0000 [IU] | Freq: Every day | SUBCUTANEOUS | Status: DC
  Administered 2014-12-10 – 2014-12-11 (×2): 30 [IU] via SUBCUTANEOUS
  Filled 2014-12-10 (×4): qty 0.3

## 2014-12-10 MED ORDER — IPRATROPIUM-ALBUTEROL 0.5-2.5 (3) MG/3ML IN SOLN: 3.0000 mL | RESPIRATORY_TRACT | Status: DC | PRN

## 2014-12-10 MED ORDER — CLOPIDOGREL BISULFATE 75 MG PO TABS
75.0000 mg | ORAL_TABLET | Freq: Every day | ORAL | Status: DC
  Administered 2014-12-10 – 2014-12-12 (×3): 75 mg via ORAL
  Filled 2014-12-10 (×3): qty 1

## 2014-12-10 MED ORDER — METOPROLOL SUCCINATE ER 25 MG PO TB24
25.0000 mg | ORAL_TABLET | Freq: Every day | ORAL | Status: DC
  Filled 2014-12-10: qty 1

## 2014-12-10 MED ORDER — DULOXETINE HCL 60 MG PO CPEP
60.0000 mg | ORAL_CAPSULE | Freq: Every day | ORAL | Status: DC
  Administered 2014-12-10 – 2014-12-12 (×3): 60 mg via ORAL
  Filled 2014-12-10 (×3): qty 1

## 2014-12-10 MED ORDER — LISINOPRIL 10 MG PO TABS
10.0000 mg | ORAL_TABLET | Freq: Every day | ORAL | Status: DC
  Administered 2014-12-10 – 2014-12-12 (×3): 10 mg via ORAL
  Filled 2014-12-10 (×3): qty 1

## 2014-12-10 MED ORDER — METFORMIN HCL 500 MG PO TABS
1000.0000 mg | ORAL_TABLET | Freq: Two times a day (BID) | ORAL | Status: DC
  Administered 2014-12-10 – 2014-12-12 (×2): 1000 mg via ORAL
  Filled 2014-12-10 (×6): qty 2

## 2014-12-10 MED ORDER — LOPERAMIDE HCL 2 MG PO CAPS
4.0000 mg | ORAL_CAPSULE | Freq: Once | ORAL | Status: AC
  Administered 2014-12-10: 4 mg via ORAL
  Filled 2014-12-10: qty 2

## 2014-12-10 MED ORDER — INSULIN GLARGINE 100 UNIT/ML SOLOSTAR PEN: 30.0000 [IU] | PEN_INJECTOR | Freq: Every day | SUBCUTANEOUS | Status: DC

## 2014-12-10 MED ORDER — ASPIRIN EC 81 MG PO TBEC
81.0000 mg | DELAYED_RELEASE_TABLET | Freq: Every day | ORAL | Status: DC
  Administered 2014-12-10 – 2014-12-12 (×3): 81 mg via ORAL
  Filled 2014-12-10 (×3): qty 1

## 2014-12-10 MED ORDER — DICLOFENAC SODIUM 1 % TD GEL: 2.0000 g | Freq: Every day | TRANSDERMAL | Status: DC | PRN

## 2014-12-10 MED ORDER — ALBUTEROL SULFATE HFA 108 (90 BASE) MCG/ACT IN AERS: 2.0000 | INHALATION_SPRAY | Freq: Four times a day (QID) | RESPIRATORY_TRACT | Status: DC | PRN

## 2014-12-10 MED ORDER — LISINOPRIL 5 MG PO TABS
5.0000 mg | ORAL_TABLET | Freq: Every day | ORAL | Status: DC
  Filled 2014-12-10: qty 1

## 2014-12-10 MED ORDER — DICLOFENAC SODIUM 1 % TD GEL
2.0000 g | Freq: Every day | TRANSDERMAL | Status: DC | PRN
  Administered 2014-12-10 – 2014-12-11 (×2): 2 g via TOPICAL
  Filled 2014-12-10: qty 100

## 2014-12-10 MED ORDER — GABAPENTIN 300 MG PO CAPS: 300.0000 mg | ORAL_CAPSULE | Freq: Three times a day (TID) | ORAL | Status: DC

## 2014-12-10 MED ORDER — DILTIAZEM HCL ER 120 MG PO CP24: 120.0000 mg | ORAL_CAPSULE | Freq: Every day | ORAL | Status: DC

## 2014-12-10 MED ORDER — INSULIN ASPART 100 UNIT/ML ~~LOC~~ SOLN
4.0000 [IU] | Freq: Three times a day (TID) | SUBCUTANEOUS | Status: DC
  Administered 2014-12-10 – 2014-12-12 (×3): 4 [IU] via SUBCUTANEOUS
  Filled 2014-12-10 (×3): qty 1

## 2014-12-10 MED ORDER — DICLOFENAC SODIUM 25 MG PO TBEC
25.0000 mg | DELAYED_RELEASE_TABLET | Freq: Three times a day (TID) | ORAL | Status: DC | PRN
  Filled 2014-12-10: qty 1

## 2014-12-10 MED ORDER — ALLOPURINOL 100 MG PO TABS
200.0000 mg | ORAL_TABLET | Freq: Every day | ORAL | Status: DC
  Administered 2014-12-12: 200 mg via ORAL
  Filled 2014-12-10 (×3): qty 2

## 2014-12-10 MED ORDER — EMTRICITAB-RILPIVIR-TENOFOV DF 200-25-300 MG PO TABS
1.0000 | ORAL_TABLET | Freq: Every day | ORAL | Status: DC
  Administered 2014-12-10 – 2014-12-12 (×3): 1 via ORAL
  Filled 2014-12-10 (×4): qty 1

## 2014-12-10 MED ORDER — FAMOTIDINE 20 MG PO TABS
20.0000 mg | ORAL_TABLET | Freq: Every day | ORAL | Status: DC
  Administered 2014-12-10 – 2014-12-11 (×2): 20 mg via ORAL
  Filled 2014-12-10 (×2): qty 1

## 2014-12-10 MED ORDER — ISOSORBIDE MONONITRATE ER 30 MG PO TB24
30.0000 mg | ORAL_TABLET | Freq: Every day | ORAL | Status: DC
  Administered 2014-12-10 – 2014-12-12 (×3): 30 mg via ORAL
  Filled 2014-12-10 (×3): qty 1

## 2014-12-10 MED ORDER — METOPROLOL SUCCINATE ER 50 MG PO TB24
50.0000 mg | ORAL_TABLET | Freq: Every day | ORAL | Status: DC
  Administered 2014-12-10 – 2014-12-12 (×2): 50 mg via ORAL
  Filled 2014-12-10 (×3): qty 1

## 2014-12-10 MED ORDER — PRAVASTATIN SODIUM 40 MG PO TABS
40.0000 mg | ORAL_TABLET | Freq: Every day | ORAL | Status: DC
  Administered 2014-12-10 – 2014-12-11 (×2): 40 mg via ORAL
  Filled 2014-12-10 (×3): qty 1

## 2014-12-10 NOTE — ED Notes (Signed)
Pharmacy tech into see

## 2014-12-10 NOTE — ED Notes (Addendum)
EDP updated w/ patients c/o of back pain and legs giving out

## 2014-12-10 NOTE — ED Notes (Signed)
Sandwich and soft drink given.  

## 2014-12-10 NOTE — ED Notes (Signed)
On the phone 

## 2014-12-10 NOTE — ED Notes (Signed)
Pt. C/o 2 loose stools today requesting meds.

## 2014-12-10 NOTE — BH Assessment (Signed)
Patient to be evaluated by Dr. Adrian Prows and Reginold Agent, NP during psychiatric rounding.   Rigoberto Noel, MSW, LCSW Triage Specialist [phone],

## 2014-12-10 NOTE — ED Notes (Signed)
Report received from Janie Rambo RN. Pt. Sleeping, respirations regular and unlabored. Will continue to monitor for safety via security cameras and Q 15 minute checks. 

## 2014-12-10 NOTE — ED Provider Notes (Signed)
TIME SEEN: 8:20 AM  CHIEF COMPLAINT: Suicidal thoughts, cocaine abuse  HPI: Pt is a 64 y.o. male with history of chronic back pain, hypertension, diabetes, hyperlipidemia, HIV with recent CD4 570 and viral load of 83 on 09/06/14, asthma, substance abuse who presents to the emergency department with complaints of relapse on cocaine for the past 4 days. States since using cocaine he has had a dry cough that causes him to have a burning, sharp chest pain intermittently. Reports some shortness of breath with the cough. No nausea, vomiting or diarrhea. No fever. No lower extremity swelling or pain. Is complaining of suicidal thoughts for the past day. He states he is hearing voices telling him to hurt himself. No prior history of suicide attempt and no specific plan currently. No HI. No visual hallucinations. Denies any other drug or alcohol use recently. She last used cocaine last night.  ROS: See HPI Constitutional: no fever  Eyes: no drainage  ENT: no runny nose   Cardiovascular:   chest pain with coughing Resp: SOB with coughing GI: no vomiting GU: no dysuria Integumentary: no rash  Allergy: no hives  Musculoskeletal: no leg swelling  Neurological: no slurred speech ROS otherwise negative  PAST MEDICAL HISTORY/PAST SURGICAL HISTORY:  Past Medical History  Diagnosis Date  . Diabetes mellitus     Diagnosed in 2002, started insulin in 2012  . Hypertension   . Headache(784.0)     CT head 08/2011: Periventricular and subcortical white matter hypodensities are most in keeping with chronic microangiopathic change  . Gout   . Hyperlipidemia   . HIV infection Nov 2012    Followed by [name]  . Crack cocaine use     for 20+ years, has been enrolled in detox programs in the past  . Chondromalacia of medial femoral condyle     Left knee MRI 04/28/12: Chondromalacia of the medial femoral condyle with slight peripheral degeneration of the meniscocapsular junction of the medial meniscus;  followed by sports medicine  . Asthma     No PFTs, history of childhood asthma  . Depression     with history of hospitalization for suicidal ideation  . CAD (coronary artery disease)     a. 06/2013 STEMI/PCI ([facility]): LAD w/ thrombus (treated with BMS), mid 75%, D2 75%; LCX OM2 75%; RCA small, PDA 95%, PLV 95%;  b. 10/2013 Cath/PCI: ISR w/in LAD (Promus DES x 2), borderline OM2 lesion;  c. 01/2014 MV: Intermediate risk, medium-sized distal ant wall infarct w/ very small amt of peri-infarct ischemia. EF 60%.  . Gout 04/28/2012  . Collagen vascular disease   . Cellulitis 04/2014    left facial    MEDICATIONS:  Prior to Admission medications   Medication Sig Start Date End Date Taking? Authorizing Provider  albuterol (PROVENTIL HFA;VENTOLIN HFA) 108 (90 BASE) MCG/ACT inhaler Inhale 2 puffs into the lungs every 6 (six) hours as needed for wheezing or shortness of breath. 12/01/14   [name], MD  allopurinol (ZYLOPRIM) 100 MG tablet Take 2 tablets (200 mg total) by mouth daily. 08/18/14   Waylan Boga, NP  aspirin EC 81 MG tablet Take 1 tablet (81 mg total) by mouth daily. 08/18/14   Waylan Boga, NP  cephALEXin (KEFLEX) 500 MG capsule Take 1 capsule (500 mg total) by mouth 2 (two) times daily. Patient not taking: Reported on 09/01/2014 08/19/14   Everlene Balls, MD  clopidogrel (PLAVIX) 75 MG tablet Take 1 tablet (75 mg total) by mouth daily. 08/18/14  Waylan Boga, NP  COMPLERA 200-25-300 MG TABS TAKE 1 TABLET BY MOUTH DAILY. 11/18/14   [name], MD  cyclobenzaprine (FEXMID) 7.5 MG tablet Take 1 tablet (7.5 mg total) by mouth 3 (three) times daily as needed for muscle spasms. 09/01/14   Debby Freiberg, MD  diclofenac (VOLTAREN) 25 MG EC tablet Take 1 tablet (25 mg total) by mouth 3 (three) times daily as needed for mild pain. 08/18/14   Waylan Boga, NP  diclofenac sodium (VOLTAREN) 1 % GEL Apply 2 g topically daily as needed (pain). 08/18/14   Waylan Boga, NP  diltiazem (DILACOR XR) 120 MG  24 hr capsule Take 1 capsule (120 mg total) by mouth daily. 08/18/14   Waylan Boga, NP  DULoxetine (CYMBALTA) 60 MG capsule Take 1 capsule (60 mg total) by mouth daily. 12/01/14   [name], MD  Emtricitab-Rilpivir-Tenofovir (COMPLERA) 200-25-300 MG TABS Take 1 tablet by mouth daily. 08/18/14   Waylan Boga, NP  famotidine (PEPCID) 20 MG tablet TAKE 1 TABLET BY MOUTH DAILY AT EIGHT PM. 11/29/14   Bartholomew Crews, MD  fentaNYL (DURAGESIC) 12 MCG/HR Place 1 patch (12.5 mcg total) onto the skin every 3 (three) days. 08/18/14   Waylan Boga, NP  gabapentin (NEURONTIN) 300 MG capsule Take 1 capsule (300 mg total) by mouth 3 (three) times daily. 08/18/14   Waylan Boga, NP  ibuprofen (ADVIL,MOTRIN) 600 MG tablet Take 1 tablet (600 mg total) by mouth every 6 (six) hours as needed. Patient taking differently: Take 600 mg by mouth every 6 (six) hours as needed for moderate pain.  08/18/14   Waylan Boga, NP  insulin aspart (NOVOLOG) 100 UNIT/ML injection Inject 4 Units into the skin 3 (three) times daily before meals. 10/20/14   [name], MD  Insulin Glargine (LANTUS) 100 UNIT/ML Solostar Pen Inject 30 Units into the skin at bedtime. 08/18/14   Waylan Boga, NP  ipratropium-albuterol (DUONEB) 0.5-2.5 (3) MG/3ML SOLN Take 3 mLs by nebulization every 4 (four) hours as needed (shortness of breath). 08/18/14   Waylan Boga, NP  isosorbide mononitrate (IMDUR) 30 MG 24 hr tablet Take 1 tablet (30 mg total) by mouth daily. 08/18/14   Waylan Boga, NP  lisinopril (PRINIVIL,ZESTRIL) 10 MG tablet Take 0.5 tablets (5 mg total) by mouth daily. 08/18/14   Waylan Boga, NP  metFORMIN (GLUCOPHAGE) 1000 MG tablet Take 1 tablet (1,000 mg total) by mouth 2 (two) times daily with a meal. 08/18/14   Waylan Boga, NP  methocarbamol (ROBAXIN) 500 MG tablet Take 1 tablet (500 mg total) by mouth every 6 (six) hours as needed for muscle spasms (or pain). 08/18/14   Waylan Boga, NP  metoprolol succinate (TOPROL-XL) 25 MG 24  hr tablet Take 1 tablet by mouth daily. 08/25/14   Historical Provider, MD  nitroGLYCERIN (NITROSTAT) 0.4 MG SL tablet Place 1 tablet (0.4 mg total) under the tongue every 5 (five) minutes as needed for chest pain. 08/18/14   Waylan Boga, NP  oxyCODONE-acetaminophen (PERCOCET) 7.5-325 MG per tablet Take 1-2 tablets by mouth every 4 (four) hours as needed for pain. Patient not taking: Reported on 09/01/2014 08/18/14   Waylan Boga, NP  pravastatin (PRAVACHOL) 40 MG tablet Take 1 tablet (40 mg total) by mouth daily. 08/18/14   Waylan Boga, NP  traMADol (ULTRAM) 50 MG tablet Take 1 tablet (50 mg total) by mouth every 6 (six) hours as needed for moderate pain. 08/18/14   Waylan Boga, NP  traMADol (ULTRAM) 50 MG tablet Take 1 tablet (  50 mg total) by mouth every 12 (twelve) hours as needed for severe pain. Patient not taking: Reported on 08/19/2014 08/19/14   Everlene Balls, MD    ALLERGIES:  No Known Allergies  SOCIAL HISTORY:  History  Substance Use Topics  . Smoking status: Never Smoker   . Smokeless tobacco: Never Used  . Alcohol Use: No    FAMILY HISTORY: Family History  Problem Relation Age of Onset  . Diabetes Mother   . Diabetes Father   . Diabetes Brother   . Colon cancer Neg Hx     EXAM: BP 130/93 mmHg  Pulse 93  Temp(Src) 97.9 F (36.6 C) (Oral)  Resp 20  SpO2 100% CONSTITUTIONAL: Alert and oriented and responds appropriately to questions. Well-appearing; well-nourished HEAD: Normocephalic EYES: Conjunctivae clear, PERRL ENT: normal nose; no rhinorrhea; moist mucous membranes; pharynx without lesions noted NECK: Supple, no meningismus, no LAD  CARD: RRR; S1 and S2 appreciated; no murmurs, no clicks, no rubs, no gallops RESP: Normal chest excursion without splinting or tachypnea; breath sounds clear and equal bilaterally; no wheezes, no rhonchi, no rales, no hypoxia or respiratory distress, speaking full sentences, good aeration diffusely ABD/GI: Normal bowel sounds;  non-distended; soft, non-tender, no rebound, no guarding BACK:  The back appears normal and is non-tender to palpation, there is no CVA tenderness and no midline spinal tenderness or step-off or deformity EXT: Normal ROM in all joints; non-tender to palpation; no edema; normal capillary refill; no cyanosis, no calf tenderness or swelling    SKIN: Normal color for age and race; warm NEURO: Moves all extremities equally, normal gait, no facial droop or slurred speech; normal sensation diffusely PSYCH: The patient's mood and manner are appropriate. Grooming and personal hygiene are appropriate.  MEDICAL DECISION MAKING: Patient here with very atypical chest pain. Pain is burning, sharp and present with coughing. Coughing also causes shortness of breath. EKG shows no new ischemic changes as unchanged compared to 2010. Screening labs as well as troponin pending. Will obtain chest x-ray. Will consult TTS once medically cleared.  ED PROGRESS: Labs unremarkable including negative troponin other than glucose greater than 200 and elevated creatinine which is baseline. He is not in DKA. He is insulin-dependent diabetic. Chest x-ray clear. Suspect chest pain is secondary to his cough which is likely from viral illness. Awaiting TTS evaluation.  It appears patient has been seen during psychiatric rounding this morning and they plan to reevaluate the patient in the morning.     EKG Interpretation  Date/Time:  Saturday December 10 2014 08:21:24 EST Ventricular Rate:  100 PR Interval:  144 QRS Duration: 67 QT Interval:  336 QTC Calculation: 433 R Axis:   63 Text Interpretation:  Fast sinus arrhythmia Minimal ST elevation, inferior leads No significant change since 2010 Confirmed by Zahmir Lalla,  DO, Sundeep Destin [phone]) on 12/10/2014 8:26:33 AM         Banner, DO 12/10/14 1107

## 2014-12-10 NOTE — ED Notes (Addendum)
During VS, pt's pulse noted to be irregular 35-65, radial pulse noted to be slow and irregular and apical pulse 44 irregular.  Pt denies cp at this time but reports that he has had brief peroids of chest pain, skin w/d, no SOB noted, but pt reports that his breathing does not feel normal.  NAD, Dr Ralene Bathe updated and will eval after EKG is complete, EKG ordered.

## 2014-12-10 NOTE — ED Notes (Signed)
Pt ambulatory w/o difficulty to room 35

## 2014-12-10 NOTE — ED Notes (Addendum)
Pt relapsed on cocaine, last use at midnight. Pt also reports suicidal thoughts, no specific thoughts. Also reports hearing voices telling him to hurt self. Pt calm and cooperative at this time. Pt also reports sob for past 4 days with non-productive cough. Hx of asthma. Pt has not taken medications in last few days.

## 2014-12-10 NOTE — ED Notes (Addendum)
Dr Leonides Schanz updated w/ Bp's- no change- give as ordered

## 2014-12-10 NOTE — BH Assessment (Signed)
Per Josephine,NP and Finley patient to be reevaluated in the AM.  Rigoberto Noel, MSW, LCSW Triage Specialist [phone],

## 2014-12-10 NOTE — ED Notes (Addendum)
ekg complete EDP to review/eval
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
## 2014-12-11 DIAGNOSIS — F333 Major depressive disorder, recurrent, severe with psychotic symptoms: Secondary | ICD-10-CM | POA: Diagnosis not present

## 2014-12-11 DIAGNOSIS — Z8659 Personal history of other mental and behavioral disorders: Secondary | ICD-10-CM | POA: Diagnosis not present

## 2014-12-11 LAB — CBG MONITORING, ED
GLUCOSE-CAPILLARY: 85 mg/dL (ref 70–99)
Glucose-Capillary: 59 mg/dL — ABNORMAL LOW (ref 70–99)
Glucose-Capillary: 103 mg/dL — ABNORMAL HIGH (ref 70–99)
Glucose-Capillary: 70 mg/dL (ref 70–99)
Glucose-Capillary: 119 mg/dL — ABNORMAL HIGH (ref 70–99)

## 2014-12-11 MED ORDER — TRAMADOL HCL 50 MG PO TABS
50.0000 mg | ORAL_TABLET | Freq: Two times a day (BID) | ORAL | Status: DC | PRN
  Administered 2014-12-11: 50 mg via ORAL
  Filled 2014-12-11: qty 1

## 2014-12-11 MED ORDER — ACETAMINOPHEN 325 MG PO TABS
650.0000 mg | ORAL_TABLET | Freq: Four times a day (QID) | ORAL | Status: DC | PRN
  Administered 2014-12-11: 650 mg via ORAL
  Filled 2014-12-11: qty 2

## 2014-12-11 NOTE — ED Notes (Addendum)
Do not transport until later this after noon--recheck CBG's at lunch and mid afternoon to verify stability per [facility]

## 2014-12-11 NOTE — ED Notes (Signed)
Up to the bathroom 

## 2014-12-11 NOTE — ED Notes (Signed)
Sandwich and soft drink given.  

## 2014-12-11 NOTE — ED Notes (Signed)
Dr Adele Schilder and josephine np into see

## 2014-12-11 NOTE — ED Notes (Signed)
Report received from Janie Rambo RN. Pt. Sleeping, respirations regular and unlabored. Will continue to monitor for safety via security cameras and Q 15 minute checks. 

## 2014-12-11 NOTE — ED Notes (Signed)
Crackers/juice given
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
## 2014-12-11 NOTE — Progress Notes (Signed)
Per Randall Hiss, [facility] patient accepted to [facility] 500-1 and can be transferred at this time.  CSW informed the nursing staff of the updated disposition and consent forms were explained.   Chesley Noon, MSW, LCSW, LCAS-A Evening Clinical Social Worker [phone]

## 2014-12-11 NOTE — ED Notes (Signed)
Josephine NP into see

## 2014-12-11 NOTE — ED Notes (Addendum)
Pt. Eating sandwich.

## 2014-12-11 NOTE — ED Notes (Signed)
Up on the phone 

## 2014-12-11 NOTE — ED Notes (Signed)
Hold patient until AM--[facility] will hold bed per [facility]

## 2014-12-11 NOTE — ED Notes (Signed)
Ha resolved

## 2014-12-12 ENCOUNTER — Inpatient Hospital Stay (HOSPITAL_COMMUNITY)
Admission: AD | Admit: 2014-12-12 | Discharge: 2014-12-20 | DRG: 885 | Disposition: A | Discharge status: Home / Self Care | Payer: 59 | Source: Intra-hospital | Attending: Psychiatry | Admitting: Psychiatry

## 2014-12-12 ENCOUNTER — Encounter (HOSPITAL_COMMUNITY): Payer: Self-pay | Admitting: *Deleted

## 2014-12-12 DIAGNOSIS — N182 Chronic kidney disease, stage 2 (mild): Secondary | ICD-10-CM | POA: Diagnosis present

## 2014-12-12 DIAGNOSIS — D1779 Benign lipomatous neoplasm of other sites: Secondary | ICD-10-CM | POA: Diagnosis not present

## 2014-12-12 DIAGNOSIS — I2510 Atherosclerotic heart disease of native coronary artery without angina pectoris: Secondary | ICD-10-CM | POA: Diagnosis not present

## 2014-12-12 DIAGNOSIS — E1140 Type 2 diabetes mellitus with diabetic neuropathy, unspecified: Secondary | ICD-10-CM | POA: Diagnosis not present

## 2014-12-12 DIAGNOSIS — K219 Gastro-esophageal reflux disease without esophagitis: Secondary | ICD-10-CM | POA: Diagnosis present

## 2014-12-12 DIAGNOSIS — F1994 Other psychoactive substance use, unspecified with psychoactive substance-induced mood disorder: Secondary | ICD-10-CM | POA: Diagnosis not present

## 2014-12-12 DIAGNOSIS — F1924 Other psychoactive substance dependence with psychoactive substance-induced mood disorder: Secondary | ICD-10-CM

## 2014-12-12 DIAGNOSIS — H532 Diplopia: Secondary | ICD-10-CM

## 2014-12-12 DIAGNOSIS — G4700 Insomnia, unspecified: Secondary | ICD-10-CM | POA: Diagnosis present

## 2014-12-12 DIAGNOSIS — F19251 Other psychoactive substance dependence with psychoactive substance-induced psychotic disorder with hallucinations: Secondary | ICD-10-CM | POA: Diagnosis not present

## 2014-12-12 DIAGNOSIS — E785 Hyperlipidemia, unspecified: Secondary | ICD-10-CM | POA: Diagnosis not present

## 2014-12-12 DIAGNOSIS — E780 Pure hypercholesterolemia: Secondary | ICD-10-CM | POA: Diagnosis present

## 2014-12-12 DIAGNOSIS — R45851 Suicidal ideations: Secondary | ICD-10-CM | POA: Diagnosis not present

## 2014-12-12 DIAGNOSIS — R4781 Slurred speech: Secondary | ICD-10-CM | POA: Diagnosis not present

## 2014-12-12 DIAGNOSIS — E118 Type 2 diabetes mellitus with unspecified complications: Secondary | ICD-10-CM | POA: Diagnosis not present

## 2014-12-12 DIAGNOSIS — I10 Essential (primary) hypertension: Secondary | ICD-10-CM | POA: Diagnosis not present

## 2014-12-12 DIAGNOSIS — Z7902 Long term (current) use of antithrombotics/antiplatelets: Secondary | ICD-10-CM | POA: Diagnosis not present

## 2014-12-12 DIAGNOSIS — M797 Fibromyalgia: Secondary | ICD-10-CM | POA: Diagnosis present

## 2014-12-12 DIAGNOSIS — B20 Human immunodeficiency virus [HIV] disease: Secondary | ICD-10-CM

## 2014-12-12 DIAGNOSIS — Z794 Long term (current) use of insulin: Secondary | ICD-10-CM | POA: Diagnosis not present

## 2014-12-12 DIAGNOSIS — F19951 Other psychoactive substance use, unspecified with psychoactive substance-induced psychotic disorder with hallucinations: Secondary | ICD-10-CM | POA: Diagnosis present

## 2014-12-12 DIAGNOSIS — E119 Type 2 diabetes mellitus without complications: Secondary | ICD-10-CM | POA: Diagnosis not present

## 2014-12-12 DIAGNOSIS — F3289 Other specified depressive episodes: Secondary | ICD-10-CM

## 2014-12-12 DIAGNOSIS — Z9119 Patient's noncompliance with other medical treatment and regimen: Secondary | ICD-10-CM | POA: Diagnosis present

## 2014-12-12 DIAGNOSIS — J45909 Unspecified asthma, uncomplicated: Secondary | ICD-10-CM | POA: Diagnosis present

## 2014-12-12 DIAGNOSIS — F19239 Other psychoactive substance dependence with withdrawal, unspecified: Secondary | ICD-10-CM | POA: Diagnosis present

## 2014-12-12 DIAGNOSIS — F1420 Cocaine dependence, uncomplicated: Secondary | ICD-10-CM | POA: Diagnosis not present

## 2014-12-12 DIAGNOSIS — F14959 Cocaine use, unspecified with cocaine-induced psychotic disorder, unspecified: Secondary | ICD-10-CM | POA: Diagnosis not present

## 2014-12-12 DIAGNOSIS — R0789 Other chest pain: Secondary | ICD-10-CM | POA: Diagnosis not present

## 2014-12-12 DIAGNOSIS — Z21 Asymptomatic human immunodeficiency virus [HIV] infection status: Secondary | ICD-10-CM | POA: Diagnosis not present

## 2014-12-12 DIAGNOSIS — I129 Hypertensive chronic kidney disease with stage 1 through stage 4 chronic kidney disease, or unspecified chronic kidney disease: Secondary | ICD-10-CM | POA: Diagnosis present

## 2014-12-12 DIAGNOSIS — R05 Cough: Secondary | ICD-10-CM | POA: Diagnosis not present

## 2014-12-12 DIAGNOSIS — IMO0002 Reserved for concepts with insufficient information to code with codable children: Secondary | ICD-10-CM | POA: Diagnosis present

## 2014-12-12 DIAGNOSIS — Z833 Family history of diabetes mellitus: Secondary | ICD-10-CM | POA: Diagnosis not present

## 2014-12-12 DIAGNOSIS — F1410 Cocaine abuse, uncomplicated: Secondary | ICD-10-CM | POA: Diagnosis not present

## 2014-12-12 DIAGNOSIS — Z7982 Long term (current) use of aspirin: Secondary | ICD-10-CM | POA: Diagnosis not present

## 2014-12-12 DIAGNOSIS — E1165 Type 2 diabetes mellitus with hyperglycemia: Secondary | ICD-10-CM | POA: Diagnosis not present

## 2014-12-12 DIAGNOSIS — F332 Major depressive disorder, recurrent severe without psychotic features: Secondary | ICD-10-CM | POA: Diagnosis not present

## 2014-12-12 DIAGNOSIS — F1423 Cocaine dependence with withdrawal: Secondary | ICD-10-CM | POA: Diagnosis not present

## 2014-12-12 DIAGNOSIS — Z792 Long term (current) use of antibiotics: Secondary | ICD-10-CM | POA: Diagnosis not present

## 2014-12-12 DIAGNOSIS — G319 Degenerative disease of nervous system, unspecified: Secondary | ICD-10-CM | POA: Diagnosis not present

## 2014-12-12 DIAGNOSIS — Z955 Presence of coronary angioplasty implant and graft: Secondary | ICD-10-CM | POA: Diagnosis not present

## 2014-12-12 DIAGNOSIS — F333 Major depressive disorder, recurrent, severe with psychotic symptoms: Secondary | ICD-10-CM | POA: Diagnosis not present

## 2014-12-12 DIAGNOSIS — Z872 Personal history of diseases of the skin and subcutaneous tissue: Secondary | ICD-10-CM | POA: Diagnosis not present

## 2014-12-12 DIAGNOSIS — F19929 Other psychoactive substance use, unspecified with intoxication, unspecified: Secondary | ICD-10-CM | POA: Diagnosis present

## 2014-12-12 DIAGNOSIS — M109 Gout, unspecified: Secondary | ICD-10-CM | POA: Diagnosis not present

## 2014-12-12 DIAGNOSIS — F329 Major depressive disorder, single episode, unspecified: Secondary | ICD-10-CM | POA: Diagnosis not present

## 2014-12-12 DIAGNOSIS — Z79899 Other long term (current) drug therapy: Secondary | ICD-10-CM | POA: Diagnosis not present

## 2014-12-12 LAB — GLUCOSE, CAPILLARY
Glucose-Capillary: 49 mg/dL — ABNORMAL LOW (ref 70–99)
Glucose-Capillary: 57 mg/dL — ABNORMAL LOW (ref 70–99)
Glucose-Capillary: 176 mg/dL — ABNORMAL HIGH (ref 70–99)

## 2014-12-12 LAB — CBG MONITORING, ED
Glucose-Capillary: 107 mg/dL — ABNORMAL HIGH (ref 70–99)
Glucose-Capillary: 55 mg/dL — ABNORMAL LOW (ref 70–99)
Glucose-Capillary: 98 mg/dL (ref 70–99)

## 2014-12-12 MED ORDER — INSULIN ASPART 100 UNIT/ML ~~LOC~~ SOLN
4.0000 [IU] | Freq: Three times a day (TID) | SUBCUTANEOUS | Status: DC
  Administered 2014-12-13: 4 [IU] via SUBCUTANEOUS

## 2014-12-12 MED ORDER — EMTRICITAB-RILPIVIR-TENOFOV DF 200-25-300 MG PO TABS
1.0000 | ORAL_TABLET | Freq: Every day | ORAL | Status: DC
  Administered 2014-12-13 – 2014-12-20 (×8): 1 via ORAL
  Filled 2014-12-12 (×12): qty 1

## 2014-12-12 MED ORDER — DULOXETINE HCL 60 MG PO CPEP
60.0000 mg | ORAL_CAPSULE | Freq: Every day | ORAL | Status: DC
  Administered 2014-12-13: 60 mg via ORAL
  Filled 2014-12-12 (×5): qty 1

## 2014-12-12 MED ORDER — IPRATROPIUM-ALBUTEROL 0.5-2.5 (3) MG/3ML IN SOLN: 3.0000 mL | RESPIRATORY_TRACT | Status: DC | PRN

## 2014-12-12 MED ORDER — METOPROLOL SUCCINATE ER 50 MG PO TB24
50.0000 mg | ORAL_TABLET | Freq: Every day | ORAL | Status: DC
  Administered 2014-12-13 – 2014-12-20 (×7): 50 mg via ORAL
  Filled 2014-12-12 (×12): qty 1

## 2014-12-12 MED ORDER — CLOPIDOGREL BISULFATE 75 MG PO TABS
75.0000 mg | ORAL_TABLET | Freq: Every day | ORAL | Status: DC
  Administered 2014-12-13 – 2014-12-20 (×8): 75 mg via ORAL
  Filled 2014-12-12 (×12): qty 1

## 2014-12-12 MED ORDER — DICLOFENAC SODIUM 1 % TD GEL
2.0000 g | Freq: Every day | TRANSDERMAL | Status: DC | PRN
Start: 2014-12-12 — End: 2014-12-20
  Administered 2014-12-17: 2 g via TOPICAL
  Filled 2014-12-12: qty 100

## 2014-12-12 MED ORDER — FAMOTIDINE 20 MG PO TABS
20.0000 mg | ORAL_TABLET | Freq: Every day | ORAL | Status: DC
  Administered 2014-12-12 – 2014-12-15 (×4): 20 mg via ORAL
  Filled 2014-12-12 (×6): qty 1

## 2014-12-12 MED ORDER — ASPIRIN EC 81 MG PO TBEC
81.0000 mg | DELAYED_RELEASE_TABLET | Freq: Every day | ORAL | Status: DC
  Administered 2014-12-13 – 2014-12-20 (×8): 81 mg via ORAL
  Filled 2014-12-12 (×12): qty 1

## 2014-12-12 MED ORDER — TRAZODONE HCL 50 MG PO TABS
50.0000 mg | ORAL_TABLET | Freq: Every evening | ORAL | Status: DC | PRN
  Administered 2014-12-12: 50 mg via ORAL
  Filled 2014-12-12 (×8): qty 1

## 2014-12-12 MED ORDER — INSULIN GLARGINE 100 UNIT/ML ~~LOC~~ SOLN
30.0000 [IU] | Freq: Every day | SUBCUTANEOUS | Status: DC
  Administered 2014-12-12: 30 [IU] via SUBCUTANEOUS

## 2014-12-12 MED ORDER — ALLOPURINOL 100 MG PO TABS
200.0000 mg | ORAL_TABLET | Freq: Every day | ORAL | Status: DC
  Administered 2014-12-13 – 2014-12-20 (×8): 200 mg via ORAL
  Filled 2014-12-12 (×11): qty 2

## 2014-12-12 MED ORDER — METFORMIN HCL 500 MG PO TABS
1000.0000 mg | ORAL_TABLET | Freq: Two times a day (BID) | ORAL | Status: DC
  Administered 2014-12-13 – 2014-12-20 (×15): 1000 mg via ORAL
  Filled 2014-12-12 (×24): qty 2

## 2014-12-12 MED ORDER — ISOSORBIDE MONONITRATE ER 30 MG PO TB24
30.0000 mg | ORAL_TABLET | Freq: Every day | ORAL | Status: DC
  Administered 2014-12-13 – 2014-12-20 (×8): 30 mg via ORAL
  Filled 2014-12-12 (×12): qty 1

## 2014-12-12 MED ORDER — PRAVASTATIN SODIUM 40 MG PO TABS
40.0000 mg | ORAL_TABLET | Freq: Every day | ORAL | Status: DC
  Administered 2014-12-13 – 2014-12-19 (×7): 40 mg via ORAL
  Filled 2014-12-12 (×10): qty 1

## 2014-12-12 MED ORDER — ALBUTEROL SULFATE HFA 108 (90 BASE) MCG/ACT IN AERS: 2.0000 | INHALATION_SPRAY | Freq: Four times a day (QID) | RESPIRATORY_TRACT | Status: DC | PRN

## 2014-12-12 MED ORDER — ACETAMINOPHEN 325 MG PO TABS
650.0000 mg | ORAL_TABLET | Freq: Four times a day (QID) | ORAL | Status: DC | PRN
  Administered 2014-12-13 – 2014-12-18 (×5): 650 mg via ORAL
  Filled 2014-12-12 (×5): qty 2

## 2014-12-12 MED ORDER — LISINOPRIL 10 MG PO TABS
10.0000 mg | ORAL_TABLET | Freq: Every day | ORAL | Status: DC
  Administered 2014-12-13 – 2014-12-16 (×4): 10 mg via ORAL
  Filled 2014-12-12 (×7): qty 1
  Filled 2014-12-12 (×2): qty 2
  Filled 2014-12-12: qty 1

## 2014-12-12 NOTE — ED Notes (Signed)
Patient discharged to [facility].  Left the unit ambulatory with [employer].  All belongings given to the drivers.

## 2014-12-12 NOTE — [facility] Counselor (Signed)
Pt signed support paperwork. The paperwork was faxed to [facility].

## 2014-12-12 NOTE — ED Notes (Signed)
Patient with CBG of 55.  He was given orange juice, graham crackers and peanut butter.  EDP was notified of low reading.  He agrees with snack and doing a recheck.

## 2014-12-12 NOTE — Progress Notes (Signed)
Patient ID: Michael [name], male   DOB: 1950/12/06, 64 y.o.   MRN: [id number] Pt came in stating he had relapsed on cocaine and wanted to get some help. Pt stated he was also becoming increasingly depressed and has been depressed for the last 20 years. Pt is suicidal but does contract for safety. Pt denies Hi/AVH. Pt feels that he is on too much medication as well and that's why he had stopped taking his medications and it also got stomach aches from his medications. Pt states he does hear voices every now and then but right now he did not hear anything. Pt is depressed at an 8, anxiety at a 6, and rates hopelessness/helplessness at a 7. Pt is pleasant and cooperative.
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
## 2014-12-12 NOTE — Progress Notes (Signed)
The focus of this group is to help patients review their daily goal of treatment and discuss progress on daily workbooks. Pt attended the evening group session and responded to all discussion prompts from the [place]. Pt shared that today was a good day, the highlight of which was a surprise visit from his siblings. "I was feeling pretty alone and then I looked up and saw them. It made me feel good." Pt's only additional request from Nursing Staff this evening was to be allowed to shave, which was taken care of following group. Pt's affect was appropriate and he volunteered several positive comments to his peers.
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
## 2014-12-12 NOTE — Tx Team (Signed)
Initial Interdisciplinary Treatment Plan   PATIENT STRESSORS: Health problems Substance abuse   PATIENT STRENGTHS: Ability for insight Communication skills Motivation for treatment/growth   PROBLEM LIST: Problem List/Patient Goals Date to be addressed Date deferred Reason deferred Estimated date of resolution  "Medication Management"      "Positive Coping Skills"      "Substance Abuse"                                           DISCHARGE CRITERIA:  Ability to meet basic life and health needs Reduction of life-threatening or endangering symptoms to within safe limits Safe-care adequate arrangements made  PRELIMINARY DISCHARGE PLAN: Attend aftercare/continuing care group Attend 12-step recovery group Return to previous living arrangement  PATIENT/FAMIILY INVOLVEMENT: This treatment plan has been presented to and reviewed with the patient, Michael [name], and/or family member.  The patient and family have been given the opportunity to ask questions and make suggestions.  [name] Violon 12/12/2014, 6:49 PM

## 2014-12-13 ENCOUNTER — Encounter (HOSPITAL_COMMUNITY): Payer: Self-pay | Admitting: Psychiatry

## 2014-12-13 DIAGNOSIS — E119 Type 2 diabetes mellitus without complications: Secondary | ICD-10-CM

## 2014-12-13 DIAGNOSIS — F19929 Other psychoactive substance use, unspecified with intoxication, unspecified: Secondary | ICD-10-CM | POA: Diagnosis present

## 2014-12-13 DIAGNOSIS — F1423 Cocaine dependence with withdrawal: Secondary | ICD-10-CM

## 2014-12-13 DIAGNOSIS — F19951 Other psychoactive substance use, unspecified with psychoactive substance-induced psychotic disorder with hallucinations: Secondary | ICD-10-CM | POA: Diagnosis present

## 2014-12-13 DIAGNOSIS — F1924 Other psychoactive substance dependence with psychoactive substance-induced mood disorder: Secondary | ICD-10-CM

## 2014-12-13 DIAGNOSIS — R45851 Suicidal ideations: Secondary | ICD-10-CM

## 2014-12-13 DIAGNOSIS — F19239 Other psychoactive substance dependence with withdrawal, unspecified: Secondary | ICD-10-CM | POA: Diagnosis present

## 2014-12-13 DIAGNOSIS — F3289 Other specified depressive episodes: Secondary | ICD-10-CM

## 2014-12-13 DIAGNOSIS — F1420 Cocaine dependence, uncomplicated: Secondary | ICD-10-CM | POA: Diagnosis present

## 2014-12-13 LAB — GLUCOSE, CAPILLARY
GLUCOSE-CAPILLARY: 119 mg/dL — AB (ref 70–99)
GLUCOSE-CAPILLARY: 62 mg/dL — AB (ref 70–99)
GLUCOSE-CAPILLARY: 69 mg/dL — AB (ref 70–99)
GLUCOSE-CAPILLARY: 189 mg/dL — AB (ref 70–99)
Glucose-Capillary: 61 mg/dL — ABNORMAL LOW (ref 70–99)
Glucose-Capillary: 96 mg/dL (ref 70–99)
Glucose-Capillary: 106 mg/dL — ABNORMAL HIGH (ref 70–99)

## 2014-12-13 MED ORDER — TRAZODONE HCL 100 MG PO TABS: 100.0000 mg | ORAL_TABLET | Freq: Every evening | ORAL | Status: DC | PRN

## 2014-12-13 MED ORDER — TRAZODONE HCL 100 MG PO TABS: 100.0000 mg | ORAL_TABLET | Freq: Every day | ORAL | Status: DC

## 2014-12-13 MED ORDER — QUETIAPINE FUMARATE 50 MG PO TABS
50.0000 mg | ORAL_TABLET | Freq: Every day | ORAL | Status: DC
  Administered 2014-12-13 – 2014-12-19 (×7): 50 mg via ORAL
  Filled 2014-12-13 (×3): qty 1
  Filled 2014-12-13: qty 4
  Filled 2014-12-13 (×6): qty 1

## 2014-12-13 MED ORDER — MIRTAZAPINE 7.5 MG PO TABS: 7.5000 mg | ORAL_TABLET | Freq: Every day | ORAL | Status: DC

## 2014-12-13 MED ORDER — GLUCOSE 4 G PO CHEW
CHEWABLE_TABLET | ORAL | Status: AC
Start: 2014-12-13 — End: 2014-12-13
  Administered 2014-12-13 (×2): 12 g
  Filled 2014-12-13: qty 1

## 2014-12-13 MED ORDER — TRAZODONE HCL 100 MG PO TABS
100.0000 mg | ORAL_TABLET | Freq: Every day | ORAL | Status: DC
  Filled 2014-12-13: qty 1

## 2014-12-13 MED ORDER — INSULIN ASPART 100 UNIT/ML ~~LOC~~ SOLN: 0.0000 [IU] | Freq: Every day | SUBCUTANEOUS | Status: DC

## 2014-12-13 MED ORDER — INSULIN GLARGINE 100 UNIT/ML ~~LOC~~ SOLN
25.0000 [IU] | Freq: Every day | SUBCUTANEOUS | Status: DC
  Administered 2014-12-13 – 2014-12-19 (×7): 25 [IU] via SUBCUTANEOUS

## 2014-12-13 MED ORDER — BUPROPION HCL 75 MG PO TABS
75.0000 mg | ORAL_TABLET | Freq: Every day | ORAL | Status: DC
  Administered 2014-12-13 – 2014-12-14 (×2): 75 mg via ORAL
  Filled 2014-12-13 (×5): qty 1

## 2014-12-13 MED ORDER — INSULIN ASPART 100 UNIT/ML ~~LOC~~ SOLN
0.0000 [IU] | Freq: Three times a day (TID) | SUBCUTANEOUS | Status: DC
Start: 2014-12-13 — End: 2014-12-20
  Administered 2014-12-14 (×2): 2 [IU] via SUBCUTANEOUS
  Administered 2014-12-15: 3 [IU] via SUBCUTANEOUS
  Administered 2014-12-16: 2 [IU] via SUBCUTANEOUS
  Administered 2014-12-16: 0 [IU] via SUBCUTANEOUS
  Administered 2014-12-17: 2 [IU] via SUBCUTANEOUS
  Administered 2014-12-18 – 2014-12-19 (×2): 3 [IU] via SUBCUTANEOUS

## 2014-12-13 NOTE — Progress Notes (Signed)
Patient in bed during this assessment. He reported that he moved to this are few months ago and has been isolating because he is not very familiar with the area. He said he has 2 sisters that lived in [place]. He endorsed a rough day due to low CBG. Denied SI/HI and denied Hallucinations. He appeared pleasant and very cooperative. Writer encouraged and supported patient. Q 15 minute check continues as ordered to maintain safety.

## 2014-12-13 NOTE — Progress Notes (Signed)
Hypoglycemic Event  CBG: 62  Treatment: 15 GM carbohydrate snack  Symptoms: Pale, Nervous/irritable, Vision changes and "dizzy"  Follow-up CBG: Time:1151 CBG Result:61  Possible Reasons for Event: Medication regimen: Insulin regimen  Comments/MD notified: RN with pt. MD, Eappen made aware.    Jimmye Norman [place] A  Remember to initiate Hypoglycemia Order Set & complete

## 2014-12-13 NOTE — [facility] Group Notes (Signed)
[place] Group Notes:  (Nursing/MHT/Case Management/Adjunct)  Date:  12/13/2014  Time:  0945am  Type of Therapy:  Nurse Education  Participation Level:  Active  Participation Quality:  Appropriate and Attentive  Affect:  Blunted and Not Congruent (pt smiles at inappropriate times)  Cognitive:  Alert and Appropriate  Insight:  Appropriate  Engagement in Group:  Engaged  Modes of Intervention:  Discussion, Education and Support  Summary of Progress/Problems: Patient attended group late d/t meeting with LCSW. Pt was engaged and responded appropriately when prompted. Pt reports his goal for the day is to "get a better understanding of myself."  Jimmye Norman, [place] A 12/13/2014, 10:57 AM

## 2014-12-13 NOTE — Progress Notes (Signed)
Hypoglycemic Event  CBG: 69  Treatment: 3 glucose tabs  Symptoms: None  Follow-up CBG: Time:1223 CBG Result:96  Possible Reasons for Event: Medication regimen: Insulin regimen  Comments/MD notified: RN with pt. MD Eappen made aware    Michael [name] A  Remember to initiate Hypoglycemia Order Set & complete

## 2014-12-13 NOTE — H&P (Signed)
Psychiatric Admission Assessment Adult  Patient Identification: Michael [name] MRN:  [id number] Date of Evaluation:  12/13/2014 Chief Complaint: Patient states " I relapsed and I needed help . 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 Cocaine use disorder, severe, dependence [F14.20] 12/13/2014  . Substance or medication-induced depressive disorder with onset during withdrawal [F19.94] 12/13/2014  . 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 Cervicalgia [M54.2] 06/28/2014  . Lumbar radiculopathy, chronic [M54.16] 06/28/2014  . Asthma, chronic [J45.909] 02/03/2014  . 3-vessel CAD [I25.10] 06/24/2013  . Arrhythmia [I49.9] 11/13/2012  . ED (erectile dysfunction) of organic origin [N52.8] 07/07/2012  . Hypertension goal BP (blood pressure) < 140/80 [I10] 04/29/2012  . Chondromalacia of left knee [M94.262] 03/19/2012  . Hyperlipidemia with target LDL less than 100 [E78.5] 02/12/2012  . Fibromyalgia [M79.7] 02/12/2012  . Uncontrolled type 2 diabetes with neuropathy [E11.40, E11.65] 10/18/2011  . HIV (human immunodeficiency virus infection) [Z21] 08/27/2011         History of Present Illness:: Michael [name] is an 64 y.o. AAmale who is divorced , lives by self in [place] ,on [place] ,presented to the [facility] ED requesting detox from cocaine and endorsing SI. Pt relapsed on 12/08/2014 on $400-500 worth of crack cocaine. Pt was clean for  6-7 months before relapse. Pt stated that he has been depressed for 20 years. Pt reported hopelessness, helplessness, fatigue, insomnia, increased irritability and isolating behavior. Pt only gets 4 hours of sleep a night.    Patient seen this AM. Patient reported feeling depressed due to all the stressors in his life. Patient with several psychosocial issues like health problems , HIV diagnosis, divorced , loneliness. Patient reports that he relapsed again and this makes him more depressed and suicidal . Patient reports he feels sad, empty all the time. He feels sleep issues as well as energy issues. Patient reports AH which tells him "crazy things" like :hurt himself".  Patient with previous admission at [facility] in the past. Patient reports going to [facility] in the past. Patient however was noncompliant on his medications and takes only his DM medications on a regular basis. Patient reports that he does not want to be on cymbalta - feels that it upsets his stomach.  Patient reports that he abuses only cocaine . He started abusing at the age of 64 y and has not been able to get off it since.He always relapsed when he tried to do so.  Patient has social support from son, sisters and brothers.      Elements:  Location:  depression,substance abuse. Quality:  sadness,lack of energy,anhedonia,sleep issues, hopelessness,guilty , si,ah. Severity:  severe. Timing:  constant. Duration:  past few weeks. Context:  hx of cocaine abuse since past 20 years. Associated Signs/Symptoms: Depression Symptoms:  depressed mood, anhedonia, insomnia, psychomotor retardation, fatigue, feelings of worthlessness/guilt, difficulty concentrating, hopelessness, recurrent thoughts of death, suicidal thoughts with specific plan, decreased appetite, (Hypo) Manic Symptoms: denies Anxiety Symptoms:  anxiety issues Psychotic Symptoms:  Hallucinations: Auditory PTSD Symptoms: NA Total Time spent with patient: 1  hour  Past Medical History:  Past Medical History  Diagnosis Date  . Diabetes mellitus     Diagnosed in 2002, started insulin in 2012  . Hypertension   . Headache(784.0)     CT head 08/2011: Periventricular and subcortical white matter hypodensities are most in keeping with chronic microangiopathic change  . Gout   . Hyperlipidemia   . HIV infection Nov 2012    Followed by [name]  . Crack cocaine use     for 20+ years, has been enrolled in detox programs in the past  . Chondromalacia of medial femoral condyle     Left knee MRI 04/28/12: Chondromalacia of the medial femoral condyle with slight peripheral degeneration of the meniscocapsular junction of the medial meniscus; followed by sports medicine  . Asthma     No PFTs, history of childhood asthma  . Depression     with history of hospitalization for suicidal ideation  . CAD (coronary artery disease)     a. 06/2013 STEMI/PCI ([facility]): LAD w/ thrombus (treated with BMS), mid 75%, D2 75%; LCX OM2 75%; RCA small, PDA 95%, PLV 95%;  b. 10/2013 Cath/PCI: ISR w/in LAD (Promus DES x 2), borderline OM2 lesion;  c. 01/2014 MV: Intermediate risk, medium-sized distal ant wall infarct w/ very small amt of peri-infarct ischemia. EF 60%.  . Gout 04/28/2012  . Collagen vascular disease   . Cellulitis 04/2014    left facial    Past Surgical History  Procedure Laterality Date  . Prostate surgery    . Bowel resection    . Back surgery      1988  . Cardiac surgery    . Cervical spine surgery      " rods in my neck "  . Coronary stent placement    . Nm myocar perf wall motion  12/27/2011    normal   Family History:  Family History  Problem Relation Age of Onset  . Diabetes Mother   . Diabetes Father   . Diabetes Brother   . Colon cancer Neg Hx    Social History:  History  Alcohol Use No     History  Drug Use  . Yes  . Special: "Crack" cocaine, Cocaine    Comment: last used crack 04/2014.    History   Social History  . Marital  Status: Divorced    Spouse Name: N/A  . Number of Children: N/A  . Years of Education: N/A   Social History Main Topics  . Smoking status: Never Smoker   . Smokeless tobacco: Never Used  . Alcohol Use: No  . Drug Use: Yes    Special: "Crack" cocaine, Cocaine     Comment: last used crack 04/2014.  Marland Kitchen Sexual Activity: No     Comment: accepted condoms   Other Topics Concern  . None   Social History Narrative   Currently staying with a friend in [place].  Was staying @ local motel until a few days ago - left b/c of bed bugs.   Additional Social History:           Patient went up to 12 th grade. Used to work as a Geophysicist/field seismologist in the past. Got seriously injured at work and is currently on [employer]. Is divorced - since the past 68 y. Patient has a 57 y old son who is supportive.                Musculoskeletal: Strength & Muscle Tone: within normal  limits Gait & Station: normal Patient leans: N/A  Psychiatric Specialty Exam: Physical Exam  Constitutional: He is oriented to person, place, and time. He appears well-developed and well-nourished.  HENT:  Head: Normocephalic.  Eyes: Conjunctivae and EOM are normal. Pupils are equal, round, and reactive to light.  Neck: Normal range of motion. Neck supple.  Cardiovascular: Normal rate and regular rhythm.   Respiratory: Effort normal.  GI: Soft.  Musculoskeletal: Normal range of motion.  Neurological: He is alert and oriented to person, place, and time.  Skin: Skin is warm.  Psychiatric: His speech is normal. His affect is labile. He is withdrawn and actively hallucinating. Cognition and memory are normal. He expresses impulsivity. He exhibits a depressed mood. He expresses suicidal ideation.    Review of Systems  Constitutional: Negative.   HENT: Negative.   Eyes: Negative.   Respiratory: Negative.   Cardiovascular: Negative.   Gastrointestinal: Negative.   Genitourinary: Negative.   Musculoskeletal: Negative.   Skin: Negative.    Neurological: Negative.   Psychiatric/Behavioral: Positive for suicidal ideas, hallucinations and substance abuse. The patient has insomnia.     Blood pressure 135/78, pulse 69, temperature 97.8 F (36.6 C), temperature source Oral, resp. rate 18, height 5\' 7"  (1.702 m), weight 77.111 kg (170 lb), SpO2 100 %.Body mass index is 26.62 kg/(m^2).  General Appearance: Fairly Groomed  Engineer, water::  Fair  Speech:  Slow  Volume:  Decreased  Mood:  Depressed  Affect:  Depressed  Thought Process:  Coherent  Orientation:  Full (Time, Place, and Person)  Thought Content:  Hallucinations: Auditory  Suicidal Thoughts:  Yes.  with intent/plan, contracts for safety  Homicidal Thoughts:  No  Memory:  Immediate;   Fair Recent;   Fair Remote;   Fair  Judgement:  Impaired  Insight:  Lacking  Psychomotor Activity:  Decreased  Concentration:  Poor  Recall:  [employer] of Knowledge:Fair  Language: Fair  Akathisia:  No  Handed:  Right  AIMS (if indicated):     Assets:  Communication Skills Desire for Improvement Social Support  ADL's:  Intact  Cognition: WNL  Sleep:  Number of Hours: 3.75   Risk to Self:  yes ,patient is suicidal ,but contracts for safety Risk to Others:  denies Prior Inpatient Therapy:  yes - [facility] Prior Outpatient Therapy:  [facility]  Alcohol Screening: Patient refused Alcohol Screening Tool: Yes 1. How often do you have a drink containing alcohol?: Never  Allergies:  No Known Allergies Lab Results:  Results for orders placed or performed during the hospital encounter of 12/12/14 (from the past 48 hour(s))  Glucose, capillary     Status: Abnormal   Collection Time: 12/12/14  5:27 PM  Result Value Ref Range   Glucose-Capillary 49 (L) 70 - 99 mg/dL   Comment 1 Notify RN   Glucose, capillary     Status: Abnormal   Collection Time: 12/12/14  5:34 PM  Result Value Ref Range   Glucose-Capillary 57 (L) 70 - 99 mg/dL  Glucose, capillary     Status: Abnormal   Collection  Time: 12/12/14  8:38 PM  Result Value Ref Range   Glucose-Capillary 176 (H) 70 - 99 mg/dL   Comment 1 Notify RN   Glucose, capillary     Status: Abnormal   Collection Time: 12/13/14  6:00 AM  Result Value Ref Range   Glucose-Capillary 119 (H) 70 - 99 mg/dL  Glucose, capillary     Status: Abnormal   Collection Time: 12/13/14  11:39 AM  Result Value Ref Range   Glucose-Capillary 62 (L) 70 - 99 mg/dL   Comment 1 Document in Chart    Comment 2 Repeat Test   Glucose, capillary     Status: Abnormal   Collection Time: 12/13/14 11:51 AM  Result Value Ref Range   Glucose-Capillary 61 (L) 70 - 99 mg/dL  Glucose, capillary     Status: Abnormal   Collection Time: 12/13/14 12:08 PM  Result Value Ref Range   Glucose-Capillary 69 (L) 70 - 99 mg/dL  Glucose, capillary     Status: None   Collection Time: 12/13/14 12:25 PM  Result Value Ref Range   Glucose-Capillary 96 70 - 99 mg/dL   Current Medications: Current Facility-Administered Medications  Medication Dose Route Frequency Provider Last Rate Last Dose  . acetaminophen (TYLENOL) tablet 650 mg  650 mg Oral Q6H PRN Delfin Gant, NP   650 mg at 12/13/14 0828  . albuterol (PROVENTIL HFA;VENTOLIN HFA) 108 (90 BASE) MCG/ACT inhaler 2 puff  2 puff Inhalation Q6H PRN Delfin Gant, NP      . allopurinol (ZYLOPRIM) tablet 200 mg  200 mg Oral Daily Delfin Gant, NP   200 mg at 12/13/14 0827  . aspirin EC tablet 81 mg  81 mg Oral Daily Delfin Gant, NP   81 mg at 12/13/14 0738  . buPROPion [facility]) tablet 75 mg  75 mg Oral Q breakfast Ursula Alert, MD   75 mg at 12/13/14 1247  . clopidogrel (PLAVIX) tablet 75 mg  75 mg Oral Daily Delfin Gant, NP   75 mg at 12/13/14 0827  . diclofenac sodium (VOLTAREN) 1 % transdermal gel 2 g  2 g Topical Daily PRN Delfin Gant, NP      . Emtricitab-Rilpivir-Tenofovir 200-25-300 MG TABS 1 tablet  1 tablet Oral Daily Delfin Gant, NP   1 tablet at 12/13/14 0738  . famotidine  (PEPCID) tablet 20 mg  20 mg Oral QHS Delfin Gant, NP   20 mg at 12/12/14 2142  . insulin aspart (novoLOG) injection 4 Units  4 Units Subcutaneous TID AC Delfin Gant, NP   4 Units at 12/13/14 [phone]  . insulin glargine (LANTUS) injection 30 Units  30 Units Subcutaneous QHS Delfin Gant, NP   30 Units at 12/12/14 2143  . ipratropium-albuterol (DUONEB) 0.5-2.5 (3) MG/3ML nebulizer solution 3 mL  3 mL Nebulization Q4H PRN Delfin Gant, NP      . isosorbide mononitrate (IMDUR) 24 hr tablet 30 mg  30 mg Oral Daily Delfin Gant, NP   30 mg at 12/13/14 0827  . lisinopril (PRINIVIL,ZESTRIL) tablet 10 mg  10 mg Oral Daily Delfin Gant, NP   10 mg at 12/13/14 0738  . metFORMIN (GLUCOPHAGE) tablet 1,000 mg  1,000 mg Oral BID WC Delfin Gant, NP   1,000 mg at 12/13/14 0739  . metoprolol succinate (TOPROL-XL) 24 hr tablet 50 mg  50 mg Oral Daily Delfin Gant, NP   50 mg at 12/13/14 0739  . pravastatin (PRAVACHOL) tablet 40 mg  40 mg Oral q1800 Delfin Gant, NP   40 mg at 12/12/14 1800  . traZODone (DESYREL) tablet 100 mg  100 mg Oral QHS Ursula Alert, MD       PTA Medications: Prescriptions prior to admission  Medication Sig Dispense Refill Last Dose  . allopurinol (ZYLOPRIM) 100 MG tablet Take 2 tablets (200 mg total) by mouth daily. (Patient  taking differently: Take 200 mg by mouth daily as needed (gout). )   12/12/2014 at Unknown time  . aspirin EC 81 MG tablet Take 1 tablet (81 mg total) by mouth daily.   12/12/2014 at Unknown time  . clopidogrel (PLAVIX) 75 MG tablet Take 1 tablet (75 mg total) by mouth daily.   12/12/2014 at Unknown time  . diclofenac (VOLTAREN) 25 MG EC tablet Take 1 tablet (25 mg total) by mouth 3 (three) times daily as needed for mild pain. 90 tablet 0 12/12/2014 at Unknown time  . diclofenac sodium (VOLTAREN) 1 % GEL Apply 2 g topically daily as needed (pain).   12/12/2014 at Unknown time  . diltiazem (DILACOR XR) 120 MG 24 hr capsule  Take 1 capsule (120 mg total) by mouth daily.   12/12/2014 at Unknown time  . Emtricitab-Rilpivir-Tenofovir (COMPLERA) 200-25-300 MG TABS Take 1 tablet by mouth daily. 30 tablet  12/12/2014 at Unknown time  . famotidine (PEPCID) 20 MG tablet TAKE 1 TABLET BY MOUTH DAILY AT EIGHT PM. (Patient taking differently: TAKE 1 TABLET BY MOUTH DAILY.) 30 tablet 11 12/12/2014 at Unknown time  . fentaNYL (DURAGESIC) 12 MCG/HR Place 1 patch (12.5 mcg total) onto the skin every 3 (three) days. 5 patch 0 12/12/2014 at Unknown time  . ibuprofen (ADVIL,MOTRIN) 600 MG tablet Take 1 tablet (600 mg total) by mouth every 6 (six) hours as needed. (Patient taking differently: Take 600 mg by mouth every 6 (six) hours as needed for moderate pain. ) 30 tablet 0 12/12/2014 at Unknown time  . insulin aspart (NOVOLOG) 100 UNIT/ML injection Inject 4 Units into the skin 3 (three) times daily before meals. 10 mL  12/12/2014 at Unknown time  . isosorbide mononitrate (IMDUR) 30 MG 24 hr tablet Take 1 tablet (30 mg total) by mouth daily. 30 tablet 6 12/12/2014 at Unknown time  . lisinopril (PRINIVIL,ZESTRIL) 10 MG tablet Take 0.5 tablets (5 mg total) by mouth daily.   12/12/2014 at Unknown time  . metFORMIN (GLUCOPHAGE) 1000 MG tablet Take 1 tablet (1,000 mg total) by mouth 2 (two) times daily with a meal.   12/12/2014 at Unknown time  . nitroGLYCERIN (NITROSTAT) 0.4 MG SL tablet Place 1 tablet (0.4 mg total) under the tongue every 5 (five) minutes as needed for chest pain.   12/12/2014 at Unknown time  . pravastatin (PRAVACHOL) 40 MG tablet Take 1 tablet (40 mg total) by mouth daily. 30 tablet  12/12/2014 at Unknown time  . albuterol (PROVENTIL HFA;VENTOLIN HFA) 108 (90 BASE) MCG/ACT inhaler Inhale 2 puffs into the lungs every 6 (six) hours as needed for wheezing or shortness of breath. 1 Inhaler 10 Unknown at Unknown time  . DULoxetine (CYMBALTA) 60 MG capsule Take 1 capsule (60 mg total) by mouth daily. 30 capsule 0 Unknown  . gabapentin (NEURONTIN) 300 MG  capsule Take 1 capsule (300 mg total) by mouth 3 (three) times daily. 90 capsule 0 Unknown  . Insulin Glargine (LANTUS) 100 UNIT/ML Solostar Pen Inject 30 Units into the skin at bedtime. 15 mL  Past Week at Unknown time  . ipratropium-albuterol (DUONEB) 0.5-2.5 (3) MG/3ML SOLN Take 3 mLs by nebulization every 4 (four) hours as needed (shortness of breath). 360 mL  Past Month at Unknown time  . methocarbamol (ROBAXIN) 500 MG tablet Take 1 tablet (500 mg total) by mouth every 6 (six) hours as needed for muscle spasms (or pain). 20 tablet 0 Unknown  . metoprolol succinate (TOPROL-XL) 25 MG 24 hr tablet Take  2 tablets by mouth daily.    Unknown    Previous Psychotropic Medications: Yes ,paxil, cymbalta  Substance Abuse History in the last 12 months:  Yes.      Consequences of Substance Abuse: Medical Consequences:  recent admission  Results for orders placed or performed during the hospital encounter of 12/12/14 (from the past 72 hour(s))  Glucose, capillary     Status: Abnormal   Collection Time: 12/12/14  5:27 PM  Result Value Ref Range   Glucose-Capillary 49 (L) 70 - 99 mg/dL   Comment 1 Notify RN   Glucose, capillary     Status: Abnormal   Collection Time: 12/12/14  5:34 PM  Result Value Ref Range   Glucose-Capillary 57 (L) 70 - 99 mg/dL  Glucose, capillary     Status: Abnormal   Collection Time: 12/12/14  8:38 PM  Result Value Ref Range   Glucose-Capillary 176 (H) 70 - 99 mg/dL   Comment 1 Notify RN   Glucose, capillary     Status: Abnormal   Collection Time: 12/13/14  6:00 AM  Result Value Ref Range   Glucose-Capillary 119 (H) 70 - 99 mg/dL  Glucose, capillary     Status: Abnormal   Collection Time: 12/13/14 11:39 AM  Result Value Ref Range   Glucose-Capillary 62 (L) 70 - 99 mg/dL   Comment 1 Document in Chart    Comment 2 Repeat Test   Glucose, capillary     Status: Abnormal   Collection Time: 12/13/14 11:51 AM  Result Value Ref Range   Glucose-Capillary 61 (L) 70 - 99  mg/dL  Glucose, capillary     Status: Abnormal   Collection Time: 12/13/14 12:08 PM  Result Value Ref Range   Glucose-Capillary 69 (L) 70 - 99 mg/dL  Glucose, capillary     Status: None   Collection Time: 12/13/14 12:25 PM  Result Value Ref Range   Glucose-Capillary 96 70 - 99 mg/dL    Observation Level/Precautions:  Fall 15 minute checks  Laboratory:  HbAIC tsh,lipid panel,hba1c,ekg  Psychotherapy:  Individual and group  Medications:  As needed  Consultations:  Diabetic consult,Hospitalist consult,Social worker consult  Discharge Concerns:  Stability and safety       Psychological Evaluations: No   Treatment Plan Summary: Daily contact with patient to assess and evaluate symptoms and progress in treatment and Medication management  Patient will benefit from inpatient treatment and stabilization.  Estimated length of stay is 5-7 days.  Reviewed past medical records,treatment plan. Will start a trial of Wellbutrin 75 mg po daily for affective symptoms. Will add Seroquel 50 mg po qhs for psychosis as well as sleep. Will continue to monitor vitals ,medication compliance and treatment side effects while patient is here.  Patient with abnormal creatinine level - will consult hospitalist. Patient with uncontrolled diabetes- will consult diabetic coordinator. Reviewed labs ,will order as needed.  CSW will start working on disposition.  Patient to participate in therapeutic milieu .       Medical Decision Making:  Review of Psycho-Social Stressors (1), Review or order clinical lab tests (1), Decision to obtain old records (1), Established Problem, Worsening (2), Review of Last Therapy Session (1), Review of Medication Regimen & Side Effects (2) and Review of New Medication or Change in Dosage (2)  I certify that inpatient services furnished can reasonably be expected to improve the patient's condition.   Rahiem Schellinger  MD  3/8/20161:09 PM

## 2014-12-13 NOTE — Progress Notes (Signed)
Hypoglycemic Event  CBG: 61  Treatment: 3 glucose tabs  Symptoms: Nervous/irritable, Vision changes and "dizzy"  Follow-up CBG: Time:1208 CBG Result:69  Possible Reasons for Event: Medication regimen: Insulin regimen  Comments/MD notified: RN with pt. MD, Eappen made aware.    Michael [name] [place] A  Remember to initiate Hypoglycemia Order Set & complete

## 2014-12-13 NOTE — Plan of Care (Signed)
Problem: Alteration in mood & ability to function due to Goal: LTG-Pt reports reduction in suicidal thoughts (Patient reports reduction in suicidal thoughts and is able to verbalize a safety plan for whenever patient is feeling suicidal)  Outcome: Not Progressing Patient continues to endorse suicidal ideation today. Pt denies having a plan and is able to verbally contract with RN for patient safety. 15 minute checks continued per protocol for patient safety.   Problem: Ineffective individual coping Goal: STG: Patient will remain free from self harm Outcome: Progressing Patient remains free from self harm. 15 minute checks continued per protocol for patient safety.   Problem: Diagnosis: Increased Risk For Suicide Attempt Goal: STG-Patient Will Attend All Groups On The Unit Outcome: Progressing Patient is attending unit groups today. Goal: STG-Patient Will Comply With Medication Regime Outcome: Progressing Patient has adhered to medication regimen today with ease.

## 2014-12-13 NOTE — Tx Team (Signed)
  Interdisciplinary Treatment Plan Update   Date Reviewed:  12/13/2014  Time Reviewed:  8:29 AM  Progress in Treatment:   Attending groups: Yes Participating in groups: Yes Taking medication as prescribed: Yes  Tolerating medication: Yes Family/Significant other contact made: No  Patient understands diagnosis: Yes AEB asking for help with voices, depression, and stopping drug use Discussing patient identified problems/goals with staff: Yes  See initial care plan Medical problems stabilized or resolved: Yes Denies suicidal/homicidal ideation: Yes  In tx team Patient has not harmed self or others: Yes  For review of initial/current patient goals, please see plan of care.  Estimated Length of Stay:  4-5 days  Reason for Continuation of Hospitalization: Depression Hallucinations Medication stabilization  New Problems/Goals identified:  N/A  Discharge Plan or Barriers:   return home, follow up outpt  Additional Comments:  AA male, 64 years old was seen this morning for compliant of depression, AH and suicidal thoughts. He also reported daily use of Crack Cocaine. He stated that he has been hearing voices telling him to kill himself and that he hears these voices after using Cocaine. Today he denies SI/HI but stated that he is always thinking about hurting himself and that he is hearing voices at this moment. He admitted to a previous admission at [facility] but could not say when.  Wellbutrin, Seroquel trial  Attendees:  Signature: Steva Colder, MD 12/13/2014 8:29 AM   Signature: Ripley Fraise, LCSW 12/13/2014 8:29 AM  Signature: Elmarie Shiley, NP 12/13/2014 8:29 AM  Signature: Kerby Nora, RN  12/13/2014 8:29 AM  Signature:  12/13/2014 8:29 AM  Signature:  12/13/2014 8:29 AM  Signature:   12/13/2014 8:29 AM  Signature:    Signature:    Signature:    Signature:    Signature:    Signature:      Scribe for Treatment Team:   Ripley Fraise, LCSW  12/13/2014 8:29 AM

## 2014-12-13 NOTE — [facility] Suicide Risk Assessment (Signed)
[facility] Admission Suicide Risk Assessment   Nursing information obtained from:  Patient Demographic factors:  Male, Low socioeconomic status, Living alone Current Mental Status:  Suicidal ideation indicated by patient Loss Factors:  NA Historical Factors:  Prior suicide attempts Risk Reduction Factors:  Religious beliefs about death Total Time spent with patient: 30 minutes Principal Problem: Substance or medication-induced depressive disorder with onset during withdrawal Diagnosis:   Patient Active Problem List   Diagnosis Date Noted  . Cocaine use disorder, severe, dependence [F14.20] 12/13/2014  . Substance or medication-induced depressive disorder with onset during withdrawal [F19.94] 12/13/2014  . 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 Cervicalgia [M54.2] 06/28/2014  . Lumbar radiculopathy, chronic [M54.16] 06/28/2014  . Asthma, chronic [J45.909] 02/03/2014  . 3-vessel CAD [I25.10] 06/24/2013  . Arrhythmia [I49.9] 11/13/2012  . ED (erectile dysfunction) of organic origin [N52.8] 07/07/2012  . Hypertension goal BP (blood pressure) < 140/80 [I10] 04/29/2012  . Chondromalacia of left knee [M94.262] 03/19/2012  . Hyperlipidemia with target LDL less than 100 [E78.5] 02/12/2012  . Fibromyalgia [M79.7] 02/12/2012  . Uncontrolled type 2 diabetes with neuropathy [E11.40, E11.65] 10/18/2011  . HIV (human immunodeficiency virus infection) [Z21] 08/27/2011     Continued Clinical Symptoms:    The "Alcohol Use Disorders Identification Test", Guidelines for Use in Primary Care, Second Edition.  [employer] [facility]). Score between 0-7:  no or low risk or alcohol related problems. Score between 8-15:  moderate risk of alcohol related problems. Score between 16-19:  high risk of alcohol related problems. Score 20 or above:  warrants further diagnostic evaluation for alcohol dependence and treatment.   CLINICAL  FACTORS:   Depression:   Anhedonia Comorbid alcohol abuse/dependence Insomnia Alcohol/Substance Abuse/Dependencies Medical Diagnoses and Treatments/Surgeries   Psychiatric Specialty Exam: Physical Exam  ROS  Blood pressure 135/78, pulse 69, temperature 97.8 F (36.6 C), temperature source Oral, resp. rate 18, height 5\' 7"  (1.702 m), weight 77.111 kg (170 lb), SpO2 100 %.Body mass index is 26.62 kg/(m^2).            Please see H&P.                                              COGNITIVE FEATURES THAT CONTRIBUTE TO RISK:  None    SUICIDE RISK:   Mild:  Suicidal ideation of limited frequency, intensity, duration, and specificity.  There are no identifiable plans, no associated intent, mild dysphoria and related symptoms, good self-control (both objective and subjective assessment), few other risk factors, and identifiable protective factors, including available and accessible social support.  PLAN OF CARE: Please see H&P.   Medical Decision Making:  Review of Psycho-Social Stressors (1), Review or order clinical lab tests (1), Review and summation of old records (2), Established Problem, Worsening (2), Review of Last Therapy Session (1), Review of Medication Regimen & Side Effects (2) and Review of New Medication or Change in Dosage (2)  I certify that inpatient services furnished can reasonably be expected to improve the patient's condition.   Kathern Lobosco MD 12/13/2014, 1:08 PM

## 2014-12-13 NOTE — Progress Notes (Signed)
D: Patient is alert and oriented. Pt's mood and affect is depression, pleasant upon interaction, and blunted. Pt's smiles at inappropriate times. Pt's eye contact is fair. Pt denies HI and AVH. Pt reports passive suicidal thoughts today. Pt rates his depression 5/10, hopelessness and anxiety 6/10. Pt reports chronic posterior neck pain 9/10 with no relief from PRN medication, pt is requesting additional medication. Pt is attending groups. Pt experienced hypoglycemic event at 1139am with symptoms including "dizzy, nervousness, and nausea" (See addionational notes). Pt reports feeling better after hypoglycemic protocol initiated. A: Cold pack applied to neck. Diabetic consultant contacted at 1312, new orders acknowledged. MD Eappen made aware of pt's concerns/complaints. PRN medication administered for pain per providers orders (See MAR). Scheduled medications administered per providers orders (See MAR). 15 minute checks continued per protocol for patient safety.  R: Pt verbally contracts for safety and agrees to come to staff with increased intensity of suicidal thoughts. Patient cooperative and receptive to nursing interventions. Pt remains safe.

## 2014-12-13 NOTE — Progress Notes (Signed)
Inpatient Diabetes Program Recommendations  AACE/ADA: New Consensus Statement on Inpatient Glycemic Control (2013)  Target Ranges:  Prepandial:   less than 140 mg/dL      Peak postprandial:   less than 180 mg/dL (1-2 hours)      Critically ill patients:  140 - 180 mg/dL    Results for Michael [name], Michael [name] (MRN [id number]) as of 12/13/2014 14:26  Ref. Range 12/11/2014 07:59 12/11/2014 08:36 12/11/2014 13:08 12/11/2014 17:42 12/11/2014 21:26  Glucose-Capillary Latest Range: 70-99 mg/dL 59 (L) 103 (H) 70 85 119 (H)    Results for Michael [name], Michael [name] (MRN [id number]) as of 12/13/2014 14:26  Ref. Range 12/12/2014 07:54 12/12/2014 08:28 12/12/2014 12:36 12/12/2014 17:27 12/12/2014 17:34 12/12/2014 20:38  Glucose-Capillary Latest Range: 70-99 mg/dL 55 (L) 107 (H) 98 49 (L) 57 (L) 176 (H)    Results for Michael [name], Michael [name] (MRN [id number]) as of 12/13/2014 14:26  Ref. Range 12/13/2014 06:00 12/13/2014 11:39 12/13/2014 11:51 12/13/2014 12:08 12/13/2014 12:25  Glucose-Capillary Latest Range: 70-99 mg/dL 119 (H) 62 (L) 61 (L) 69 (L) 96     Chief Complaint: Depression/ SI/ Crack Cocaine withdrawal  History: DM, Substance Abuse, HIV, CAD  Home DM Meds: Lantus 30 units QHS        Novolog 4 units tidwc        Metformin 1000 mg bid  Current DM Orders: Lantus 30 units QHS    Novolog 4 units tidwc    Metformin 1000 mg bid    **Received referral for this patient.  Patient having Hypoglycemia since admission.  **Patient has received Lantus 30 units QHS for the last 3 nights.    MD- Recommend the following insulin adjustments:  1. Decrease Lantus to 25 units QHS  2. Discontinue Novolog 4 units tidwc for now  3. Start Novolog Moderate SSI tid ac + HS     Will follow. Wyn Quaker RN, MSN, CDE Diabetes Coordinator Inpatient Diabetes Program Team Pager: [phone] (8a-10p)

## 2014-12-13 NOTE — Progress Notes (Signed)
D: Pt presents flat in affect and depressed in mood. Pt's affect brightens upon interaction. Pt verbalizes guilt over his recent relapse of cocaine. Pt wants help but on the outpatient basis. Pt reports having reoccurring thoughts of SI. Pt entails that he recently had SI while walking over the bridge and near oncoming traffic. Pt reports seeking help afterwards. Pt is currently denying any HI/AVH. Pt was calm and cooperative this evening. Pt reports no psych hx.  A: Writer administered scheduled and prn medications to pt, per MD orders. Continued support and availability as needed was extended to this pt. Staff continue to monitor pt with q70min checks.  R: No adverse drug reactions noted. Pt receptive to treatment. Pt remains safe at this time.
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
## 2014-12-13 NOTE — [facility] Group Notes (Signed)
[place] LCSW Group Therapy  12/13/2014 , 3:56 PM   Type of Therapy:  Group Therapy  Participation Level:  Active  Participation Quality:  Attentive  Affect:  Appropriate  Cognitive:  Alert  Insight:  Improving  Engagement in Therapy:  Engaged  Modes of Intervention:  Discussion, Exploration and Socialization  Summary of Progress/Problems: Today's group focused on the term Diagnosis.  Participants were asked to define the term, and then pronounce whether it is a negative, positive or neutral term.  Michael [name] attended the first part of group until he was pulled out to see the Dr.  While there, he talked about his multiple diagnoses, including mental health, substance abuse and medical.  Michael [name] 12/13/2014 , 3:56 PM

## 2014-12-13 NOTE — Progress Notes (Signed)
The focus of this group is to help patients review their daily goal of treatment and discuss progress on daily workbooks. Pt attended the evening group session and responded to all discussion prompts from the [facility]. Pt shared that today was a so-so day that began rough. "I've been struggling with my blood sugar and it has me feeling tired all the time. I don't feel like me." Pt did say he had a medication change earlier today that might help combat this, which he was hopeful about. Pt reported having no additional requests from Nursing Staff this evening. Pt's affect was appropriate.

## 2014-12-14 DIAGNOSIS — E1140 Type 2 diabetes mellitus with diabetic neuropathy, unspecified: Secondary | ICD-10-CM

## 2014-12-14 DIAGNOSIS — F1994 Other psychoactive substance use, unspecified with psychoactive substance-induced mood disorder: Secondary | ICD-10-CM

## 2014-12-14 DIAGNOSIS — Z21 Asymptomatic human immunodeficiency virus [HIV] infection status: Secondary | ICD-10-CM

## 2014-12-14 DIAGNOSIS — E1165 Type 2 diabetes mellitus with hyperglycemia: Secondary | ICD-10-CM

## 2014-12-14 DIAGNOSIS — H532 Diplopia: Secondary | ICD-10-CM

## 2014-12-14 DIAGNOSIS — B20 Human immunodeficiency virus [HIV] disease: Secondary | ICD-10-CM

## 2014-12-14 LAB — GLUCOSE, CAPILLARY
GLUCOSE-CAPILLARY: 145 mg/dL — AB (ref 70–99)
Glucose-Capillary: 127 mg/dL — ABNORMAL HIGH (ref 70–99)
Glucose-Capillary: 120 mg/dL — ABNORMAL HIGH (ref 70–99)
Glucose-Capillary: 149 mg/dL — ABNORMAL HIGH (ref 70–99)

## 2014-12-14 LAB — COMPREHENSIVE METABOLIC PANEL
ALT: 19 U/L (ref 0–53)
AST: 24 U/L (ref 0–37)
Albumin: 4 g/dL (ref 3.5–5.2)
Alkaline Phosphatase: 57 U/L (ref 39–117)
Anion gap: 4 — ABNORMAL LOW (ref 5–15)
BUN: 22 mg/dL (ref 6–23)
CALCIUM: 8.6 mg/dL (ref 8.4–10.5)
CO2: 24 mmol/L (ref 19–32)
Chloride: 107 mmol/L (ref 96–112)
Creatinine, Ser: 1.41 mg/dL — ABNORMAL HIGH (ref 0.50–1.35)
GFR calc Af Amer: 59 mL/min — ABNORMAL LOW (ref >90)
GFR calc non Af Amer: 51 mL/min — ABNORMAL LOW (ref >90)
Glucose, Bld: 125 mg/dL — ABNORMAL HIGH (ref 70–99)
Potassium: 4.1 mmol/L (ref 3.5–5.1)
Sodium: 135 mmol/L (ref 135–145)
TOTAL PROTEIN: 7.5 g/dL (ref 6.0–8.3)
Total Bilirubin: 0.4 mg/dL (ref 0.3–1.2)

## 2014-12-14 MED ORDER — BUPROPION HCL ER (SR) 100 MG PO TB12
100.0000 mg | ORAL_TABLET | Freq: Two times a day (BID) | ORAL | Status: DC
  Administered 2014-12-14 – 2014-12-20 (×12): 100 mg via ORAL
  Filled 2014-12-14 (×3): qty 1
  Filled 2014-12-14: qty 6
  Filled 2014-12-14 (×3): qty 1
  Filled 2014-12-14: qty 6
  Filled 2014-12-14 (×10): qty 1

## 2014-12-14 NOTE — Consult Note (Signed)
Medical Consultation  Michael [name] [id number] DOB: 08-Aug-1951 DOA: 12/12/2014 PCP: [name], MD   Requesting physician: Shea Evans Date of consultation: 12/14/14 Reason for consultation: diplopia and elevated creatinine  Impression/Recommendations Diplopia -low clinical suspicion of opportunistic infection or cerebral ischemia -MRI of brain in setting of HIV and hx of CV disease and intermittent slurred speech and cocaine use -Neuro exam nonfocal -no epidemiologic evidence of atypical infection -If workup is negative, patient will need optometry/ophthalmology follow-up in the outpatient setting -TSH Left Upper extremity weakness/left lower extremity weakness -Patient has had cervical and lumbar fusion secondary to spinal stenosis -Could not appreciate focal weakness on exam -eventually needs PT CKD stage II -Baseline creatinine 1.0-1.3 -present serum creatinine 1.4 -Likely due to diabetes -Certainly, patient may have a component of membranous nephropathy from HIV -creatinine has been stable since July 2015 -Will need routine monitoring as pt is on metformin and lisinopril-->continue for now as renal function appears stable -d/c lisinopril and metformin if renal function worsens -pt also took ibuprofen, voltaren as out pt -tenofovir part of Complera may also cause renal insufficiency -check CK -repeat BMP in am Diabetes mellitus type 2 -CBG is fairly well controlled -Hemoglobin A1c in the morning -05/01/2014 hemoglobin A1c 11.4 -Continue present dose of Lantus and NovoLog sliding scale -Monitor renal function on metformin HIV Disease -continue Complera-->monitor renal function -09/06/2014 CD4 count 570/36% -HIV RNA 83 copies Hypertension -Continue metoprolol succinate -Controlled Hyperlipidemia -Continue statin Coronary artery disease with history of stents -Continue aspirin, Plavix, metoprolol succinate, Imdur     I will followup again tomorrow. Please  contact me if I can be of assistance in the meanwhile. Thank you for this consultation.  Chief Complaint: Diplopia, elevated creatinine  HPI:  64 year old male with a history of HIV, diabetes mellitus, hypertension, hyperlipidemia presented to [facility] on 12/12/2014 at the request for cocaine detoxification. The patient has been complaining of diplopia for the better part of 6 months, but it has been more frequent in the past week. He denies any worsening focal extremity weakness, but complains of chronic left upper extremity and left lower extremity weakness. He complains of intermittent slurred speech. He states that the diplopia usually occurs in the morning lasting 15-20 minutes. He denies any dysesthesias, fevers, chills, nausea, vomiting, diarrhea. The patient states that he had 4 cardiac stents placed at [facility], but he is unable to tell me when that happened. He denies any dysesthesias. He normally is able to exit without any assistive devices. He endorses compliance with all his medications although he states that he misses possibly 4 doses of Complera.  Review of Systems:  Constitutional:  No weight loss, night sweats, Fevers, chills, fatigue.  Head&Eyes: No headache.  No vision loss.  No eye pain or scotoma ENT:  No Difficulty swallowing,Tooth/dental problems,Sore throat,   Cardio-vascular:  NoOrthopnea, PND, swelling in lower extremities,  dizziness, palpitations  GI:  No heartburn, indigestion, abdominal pain, nausea, vomiting, diarrhea, loss of appetite, hematochezia, melena Resp:  No shortness of breath with exertion or at rest. No excess mucus, no productive cough, No non-productive cough, No coughing up of blood.No change in color of mucus.No wheezing.No chest wall deformity  Skin:  no rash or lesions.  GU:  no dysuria, change in color of urine, no urgency or frequency. No flank pain.  Musculoskeletal:  No joint pain or swelling. No decreased range of motion. No back pain.    Psych:  No change in mood or affect.  Neurologic: No headache, no  dysesthesia, No syncope   Past Medical History  Diagnosis Date  . Diabetes mellitus     Diagnosed in 2002, started insulin in 2012  . Hypertension   . Headache(784.0)     CT head 08/2011: Periventricular and subcortical white matter hypodensities are most in keeping with chronic microangiopathic change  . Gout   . Hyperlipidemia   . HIV infection Nov 2012    Followed by [name]  . Crack cocaine use     for 20+ years, has been enrolled in detox programs in the past  . Chondromalacia of medial femoral condyle     Left knee MRI 04/28/12: Chondromalacia of the medial femoral condyle with slight peripheral degeneration of the meniscocapsular junction of the medial meniscus; followed by sports medicine  . Asthma     No PFTs, history of childhood asthma  . Depression     with history of hospitalization for suicidal ideation  . CAD (coronary artery disease)     a. 06/2013 STEMI/PCI ([facility]): LAD w/ thrombus (treated with BMS), mid 75%, D2 75%; LCX OM2 75%; RCA small, PDA 95%, PLV 95%;  b. 10/2013 Cath/PCI: ISR w/in LAD (Promus DES x 2), borderline OM2 lesion;  c. 01/2014 MV: Intermediate risk, medium-sized distal ant wall infarct w/ very small amt of peri-infarct ischemia. EF 60%.  . Gout 04/28/2012  . Collagen vascular disease   . Cellulitis 04/2014    left facial   Past Surgical History  Procedure Laterality Date  . Prostate surgery    . Bowel resection    . Back surgery      1988  . Cardiac surgery    . Cervical spine surgery      " rods in my neck "  . Coronary stent placement    . Nm myocar perf wall motion  12/27/2011    normal   Social History:  reports that he has never smoked. He has never used smokeless tobacco. He reports that he uses illicit drugs ("Crack" cocaine and Cocaine). He reports that he does not drink alcohol.  Family History  Problem Relation Age of Onset  . Diabetes Mother   . Diabetes  Father   . Diabetes Brother   . Colon cancer Neg Hx     No Known Allergies   Prior to Admission medications   Medication Sig Start Date End Date Taking? Authorizing Provider  allopurinol (ZYLOPRIM) 100 MG tablet Take 2 tablets (200 mg total) by mouth daily. Patient taking differently: Take 200 mg by mouth daily as needed (gout).  08/18/14  Yes Patrecia Pour, NP  aspirin EC 81 MG tablet Take 1 tablet (81 mg total) by mouth daily. 08/18/14  Yes Patrecia Pour, NP  clopidogrel (PLAVIX) 75 MG tablet Take 1 tablet (75 mg total) by mouth daily. 08/18/14  Yes Patrecia Pour, NP  diclofenac (VOLTAREN) 25 MG EC tablet Take 1 tablet (25 mg total) by mouth 3 (three) times daily as needed for mild pain. 08/18/14  Yes Patrecia Pour, NP  diclofenac sodium (VOLTAREN) 1 % GEL Apply 2 g topically daily as needed (pain). 08/18/14  Yes Patrecia Pour, NP  diltiazem (DILACOR XR) 120 MG 24 hr capsule Take 1 capsule (120 mg total) by mouth daily. 08/18/14  Yes Patrecia Pour, NP  Emtricitab-Rilpivir-Tenofovir (COMPLERA) 200-25-300 MG TABS Take 1 tablet by mouth daily. 08/18/14  Yes Patrecia Pour, NP  famotidine (PEPCID) 20 MG tablet TAKE 1 TABLET BY MOUTH DAILY AT EIGHT  PM. Patient taking differently: TAKE 1 TABLET BY MOUTH DAILY. 11/29/14  Yes Bartholomew Crews, MD  fentaNYL (DURAGESIC) 12 MCG/HR Place 1 patch (12.5 mcg total) onto the skin every 3 (three) days. 08/18/14  Yes Patrecia Pour, NP  ibuprofen (ADVIL,MOTRIN) 600 MG tablet Take 1 tablet (600 mg total) by mouth every 6 (six) hours as needed. Patient taking differently: Take 600 mg by mouth every 6 (six) hours as needed for moderate pain.  08/18/14  Yes Patrecia Pour, NP  insulin aspart (NOVOLOG) 100 UNIT/ML injection Inject 4 Units into the skin 3 (three) times daily before meals. 10/20/14  Yes Norman Herrlich, MD  isosorbide mononitrate (IMDUR) 30 MG 24 hr tablet Take 1 tablet (30 mg total) by mouth daily. 08/18/14  Yes Patrecia Pour, NP    lisinopril (PRINIVIL,ZESTRIL) 10 MG tablet Take 0.5 tablets (5 mg total) by mouth daily. 08/18/14  Yes Patrecia Pour, NP  metFORMIN (GLUCOPHAGE) 1000 MG tablet Take 1 tablet (1,000 mg total) by mouth 2 (two) times daily with a meal. 08/18/14  Yes Patrecia Pour, NP  nitroGLYCERIN (NITROSTAT) 0.4 MG SL tablet Place 1 tablet (0.4 mg total) under the tongue every 5 (five) minutes as needed for chest pain. 08/18/14  Yes Patrecia Pour, NP  pravastatin (PRAVACHOL) 40 MG tablet Take 1 tablet (40 mg total) by mouth daily. 08/18/14  Yes Patrecia Pour, NP  albuterol (PROVENTIL HFA;VENTOLIN HFA) 108 (90 BASE) MCG/ACT inhaler Inhale 2 puffs into the lungs every 6 (six) hours as needed for wheezing or shortness of breath. 12/01/14   Norman Herrlich, MD  DULoxetine (CYMBALTA) 60 MG capsule Take 1 capsule (60 mg total) by mouth daily. 12/01/14   Norman Herrlich, MD  gabapentin (NEURONTIN) 300 MG capsule Take 1 capsule (300 mg total) by mouth 3 (three) times daily. 08/18/14   Patrecia Pour, NP  Insulin Glargine (LANTUS) 100 UNIT/ML Solostar Pen Inject 30 Units into the skin at bedtime. 08/18/14   Patrecia Pour, NP  ipratropium-albuterol (DUONEB) 0.5-2.5 (3) MG/3ML SOLN Take 3 mLs by nebulization every 4 (four) hours as needed (shortness of breath). 08/18/14   Patrecia Pour, NP  methocarbamol (ROBAXIN) 500 MG tablet Take 1 tablet (500 mg total) by mouth every 6 (six) hours as needed for muscle spasms (or pain). 08/18/14   Patrecia Pour, NP  metoprolol succinate (TOPROL-XL) 25 MG 24 hr tablet Take 2 tablets by mouth daily.  08/25/14   Historical Provider, MD    Physical Exam: Filed Vitals:   12/13/14 1128 12/13/14 1659 12/14/14 0827 12/14/14 0828  BP: 135/78 117/70 116/67 103/67  Pulse: 69 76 75 84  Temp:   97.8 F (36.6 C)   TempSrc:   Oral   Resp:   17   Height:      Weight:      SpO2:       General:  A&O x 3, NAD, nontoxic, pleasant/cooperative Head/Eye: No conjunctival hemorrhage, no icterus,  [place]/AT, No nystagmus ENT:  No icterus,  No thrush, good dentition, no pharyngeal exudate Neck:  No masses, no lymphadenpathy, no bruits CV:  RRR, no rub, no gallop, no S3 Lung:  CTAB, good air movement, no wheeze, no rhonchi Abdomen: soft/NT, +BS, nondistended, no peritoneal signs Ext: No cyanosis, No rashes, No petechiae, No lymphangitis, No edema Neuro: CNII-XII intact, strength 4/5 in bilateral upper and lower extremities, no dysmetria  Labs on Admission:  Basic Metabolic Panel:  Recent Labs  Lab 12/10/14 0832 12/14/14 0655  NA 137 135  K 4.1 4.1  CL 105 107  CO2 24 24  GLUCOSE 225* 125*  BUN 21 22  CREATININE 1.36* 1.41*  CALCIUM 9.4 8.6   Liver Function Tests:  Recent Labs Lab 12/10/14 0832 12/14/14 0655  AST 53* 24  ALT 24 19  ALKPHOS 62 57  BILITOT 1.0 0.4  PROT 7.9 7.5  ALBUMIN 4.7 4.0   No results for input(s): LIPASE, AMYLASE in the last 168 hours. No results for input(s): AMMONIA in the last 168 hours. CBC:  Recent Labs Lab 12/10/14 0832  WBC 4.3  HGB 12.1*  HCT 37.2*  MCV 81.9  PLT 173   Cardiac Enzymes: No results for input(s): CKTOTAL, CKMB, CKMBINDEX, TROPONINI in the last 168 hours. BNP: Invalid input(s): POCBNP CBG:  Recent Labs Lab 12/13/14 1225 12/13/14 1651 12/13/14 2047 12/14/14 0602 12/14/14 1146  GLUCAP 96 106* 189* 127* 120*    Radiological Exams on Admission: No results found.   Time spent: 60 min  Nissan Frazzini [facility] Pager [phone]  If 7PM-7AM, please contact night-coverage [email or web address] Password [email or web address] 12/14/2014, 3:55 PM

## 2014-12-14 NOTE — Plan of Care (Signed)
Problem: Diagnosis: Increased Risk For Suicide Attempt Goal: STG-Patient Will Attend All Groups On The Unit Outcome: Progressing Patient attending programming on unit.

## 2014-12-14 NOTE — Progress Notes (Signed)
Found patient resting in bed, awake. Promptly came up to nurses' station for medications. Patient is flat and sad both in affect and mood. Rates depression and anxiety at a 7/10. Chronic neck pain continues to be an issue and patient states it always stays at a 9-10/10. States it is something he lives with as he has not found any meds that are effective. Does report some blurred vision and MD is aware. Discussed patient's status with team. Patient medicated per orders without difficulty. Patient offered support and reassurance. He endorses passive SI but verbally contracts for safety. Denies AVH/HI. Patient safe on unit. Jamie Kato

## 2014-12-14 NOTE — Progress Notes (Signed)
D: Pt presents flat in affect and depressed in mood. Pt reports ongoing tiredness (including prior to admission). Pt is passive SI with no plan. Pt verbally contracts for safety. Pt is visible within the milieu. Pt minimally interacts with others. Pt had questions about the indications for his upcoming MRI. Writer informed pt of the indications as noted in chart. Pt receptive to the information. Pt denies any HI/AVH. A: Writer administered scheduled medications to pt, per MD orders. QHS medications reviewed with pt. Continued support and availability as needed was extended to this pt. Staff continues to monitor pt with q93min checks.  R: No adverse drug reactions noted. Pt receptive to treatment. Pt remains safe at this time.

## 2014-12-14 NOTE — [facility] Group Notes (Signed)
[facility] [employer] Group Therapy  12/14/2014 , 1:47 PM    Type of Therapy:  [employer] Presentation  Participation Level:  Active  Participation Quality:  Attentive  Affect:  Blunted  Cognitive:  Oriented  Insight:  Limited  Engagement in Therapy:  Engaged  Modes of Intervention:  Discussion, Education and Socialization  Summary of Progress/Problems:  Michael [name] from [place] came to present his recovery story and play the guitar.  Sat quietly, was attentive throughout.  Michael [name] 12/14/2014 , 1:47 PM

## 2014-12-14 NOTE — Progress Notes (Signed)
Discussed consult need with [facility], pending consult to further evaluate elevated Cr and double vision.

## 2014-12-14 NOTE — Progress Notes (Signed)
[facility] MD Progress Note  12/14/2014 2:30 PM Michael [name]  MRN:  [id number] Subjective: Patient states 'I still feel suicidal , they are fleeting thoughts, I feel sad.'  Objective; Patient seen and chart reviewed.Patient discussed with treatment team. Pt presented with worsening depression , after relapsing on cocaine. Pt uses a lot - used 500$ worth recently.Pt with diagnosis of HIV , loneliness is the main trigger. Pt today continues to be withdrawn , has several somatic complaints. Reports seeing "double". Also reports pain issues . Pt with SI that is passive . Pt is compliant on medications. Denies side effects. Pt reports AH as improving . Pt is alert, ox4, . Pt denies side effects of medications. Discussed with pt that Hospitalist is to going to be consulted for his somatic sx, agrees with plan.   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 Cocaine use disorder, severe, dependence [F14.20] 12/13/2014  . Substance or medication-induced depressive disorder with onset during withdrawal [F19.94] 12/13/2014  . 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 DM (diabetes mellitus) [E11.9] 12/13/2014  . Cervicalgia [M54.2] 06/28/2014  . Lumbar radiculopathy, chronic [M54.16] 06/28/2014  . Asthma, chronic [J45.909] 02/03/2014  . 3-vessel CAD [I25.10] 06/24/2013  . Arrhythmia [I49.9] 11/13/2012  . ED (erectile dysfunction) of organic origin [N52.8] 07/07/2012  .  Hypertension goal BP (blood pressure) < 140/80 [I10] 04/29/2012  . Chondromalacia of left knee [M94.262] 03/19/2012  . Hyperlipidemia with target LDL less than 100 [E78.5] 02/12/2012  . Fibromyalgia [M79.7] 02/12/2012  . Uncontrolled type 2 diabetes with neuropathy [E11.40, E11.65] 10/18/2011  . HIV (human immunodeficiency virus infection) [Z21] 08/27/2011   Total Time spent with patient: 30 minutes         Past Medical History:  Past Medical History  Diagnosis Date  . Diabetes mellitus     Diagnosed in 2002, started insulin in 2012  . Hypertension   . Headache(784.0)     CT head 08/2011: Periventricular and subcortical white matter hypodensities are most in keeping with chronic microangiopathic change  . Gout   . Hyperlipidemia   . HIV infection Nov 2012    Followed by [name]  . Crack cocaine use     for 20+ years, has been enrolled in detox programs in the past  . Chondromalacia of medial femoral condyle     Left knee MRI 04/28/12: Chondromalacia of the medial femoral condyle with slight peripheral degeneration of the meniscocapsular junction of the medial meniscus; followed by sports medicine  . Asthma     No PFTs, history of childhood asthma  . Depression     with history of hospitalization for suicidal ideation  . CAD (coronary artery disease)     a. 06/2013 STEMI/PCI ([facility]): LAD w/ thrombus (treated with BMS), mid 75%, D2 75%; LCX OM2 75%; RCA small, PDA 95%, PLV 95%;  b. 10/2013 Cath/PCI: ISR w/in LAD (Promus DES x 2), borderline OM2 lesion;  c. 01/2014 MV: Intermediate risk, medium-sized distal ant wall infarct w/ very small amt of peri-infarct ischemia. EF 60%.  . Gout 04/28/2012  . Collagen vascular disease   .  Cellulitis 04/2014    left facial    Past Surgical History  Procedure Laterality Date  . Prostate surgery    . Bowel resection    . Back surgery      1988  . Cardiac surgery    . Cervical spine surgery      " rods in my neck "  . Coronary stent  placement    . Nm myocar perf wall motion  12/27/2011    normal   Family History:  Family History  Problem Relation Age of Onset  . Diabetes Mother   . Diabetes Father   . Diabetes Brother   . Colon cancer Neg Hx    Social History:  History  Alcohol Use No     History  Drug Use  . Yes  . Special: "Crack" cocaine, Cocaine    Comment: last used crack 04/2014.    History   Social History  . Marital Status: Divorced    Spouse Name: N/A  . Number of Children: N/A  . Years of Education: N/A   Social History Main Topics  . Smoking status: Never Smoker   . Smokeless tobacco: Never Used  . Alcohol Use: No  . Drug Use: Yes    Special: "Crack" cocaine, Cocaine     Comment: last used crack 04/2014.  Marland Kitchen Sexual Activity: No     Comment: accepted condoms   Other Topics Concern  . None   Social History Narrative   Currently staying with a friend in [place].  Was staying @ local motel until a few days ago - left b/c of bed bugs.   Additional History:    Sleep: Fair  Appetite:  Fair    Musculoskeletal: Strength & Muscle Tone: within normal limits Gait & Station: normal Patient leans: N/A   Psychiatric Specialty Exam: Physical Exam  Review of Systems  Psychiatric/Behavioral: Positive for depression, suicidal ideas and substance abuse.    Blood pressure 103/67, pulse 84, temperature 97.8 F (36.6 C), temperature source Oral, resp. rate 17, height 5' 7" (1.702 m), weight 77.111 kg (170 lb), SpO2 100 %.Body mass index is 26.62 kg/(m^2).  General Appearance: Casual  Eye Contact::  Fair  Speech:  Slow  Volume:  Decreased  Mood:  Anxious and Depressed  Affect:  Congruent  Thought Process:  Goal Directed  Orientation:  Full (Time, Place, and Person)  Thought Content:  Hallucinations: Auditory and Rumination  Suicidal Thoughts:  Yes.  without intent/plan  Homicidal Thoughts:  No  Memory:  Immediate;   Fair Recent;   Fair Remote;   Fair  Judgement:  Fair  Insight:  Fair   Psychomotor Activity:  Normal  Concentration:  Fair  Recall:  [employer] of Knowledge:Fair  Language: Fair  Akathisia:  No  Handed:  Right  AIMS (if indicated):     Assets:  Communication Skills Desire for Improvement  ADL's:  Intact  Cognition: WNL  Sleep:  Number of Hours: 3.75     Current Medications: Current Facility-Administered Medications  Medication Dose Route Frequency Provider Last Rate Last Dose  . acetaminophen (TYLENOL) tablet 650 mg  650 mg Oral Q6H PRN Delfin Gant, NP   650 mg at 12/13/14 1659  . albuterol (PROVENTIL HFA;VENTOLIN HFA) 108 (90 BASE) MCG/ACT inhaler 2 puff  2 puff Inhalation Q6H PRN Delfin Gant, NP      . allopurinol (ZYLOPRIM) tablet 200 mg  200 mg Oral Daily Delfin Gant, NP   200  mg at 12/14/14 0852  . aspirin EC tablet 81 mg  81 mg Oral Daily Delfin Gant, NP   81 mg at 12/14/14 0852  . buPROPion (WELLBUTRIN SR) 12 hr tablet 100 mg  100 mg Oral BID Ursula Alert, MD      . clopidogrel (PLAVIX) tablet 75 mg  75 mg Oral Daily Delfin Gant, NP   75 mg at 12/14/14 0852  . diclofenac sodium (VOLTAREN) 1 % transdermal gel 2 g  2 g Topical Daily PRN Delfin Gant, NP      . Emtricitab-Rilpivir-Tenofovir 200-25-300 MG TABS 1 tablet  1 tablet Oral Daily Delfin Gant, NP   1 tablet at 12/14/14 0853  . famotidine (PEPCID) tablet 20 mg  20 mg Oral QHS Delfin Gant, NP   20 mg at 12/13/14 2127  . insulin aspart (novoLOG) injection 0-15 Units  0-15 Units Subcutaneous TID WC Ursula Alert, MD   Stopped at 12/14/14 1200  . insulin aspart (novoLOG) injection 0-5 Units  0-5 Units Subcutaneous QHS Ursula Alert, MD   0 Units at 12/13/14 2200  . insulin glargine (LANTUS) injection 25 Units  25 Units Subcutaneous QHS Ursula Alert, MD   25 Units at 12/13/14 2151  . ipratropium-albuterol (DUONEB) 0.5-2.5 (3) MG/3ML nebulizer solution 3 mL  3 mL Nebulization Q4H PRN Delfin Gant, NP      . isosorbide mononitrate  (IMDUR) 24 hr tablet 30 mg  30 mg Oral Daily Delfin Gant, NP   30 mg at 12/14/14 0853  . lisinopril (PRINIVIL,ZESTRIL) tablet 10 mg  10 mg Oral Daily Delfin Gant, NP   10 mg at 12/14/14 0853  . metFORMIN (GLUCOPHAGE) tablet 1,000 mg  1,000 mg Oral BID WC Delfin Gant, NP   1,000 mg at 12/14/14 0853  . metoprolol succinate (TOPROL-XL) 24 hr tablet 50 mg  50 mg Oral Daily Delfin Gant, NP   50 mg at 12/14/14 0853  . pravastatin (PRAVACHOL) tablet 40 mg  40 mg Oral q1800 Delfin Gant, NP   40 mg at 12/13/14 1808  . QUEtiapine (SEROQUEL) tablet 50 mg  50 mg Oral QHS Ursula Alert, MD   50 mg at 12/13/14 2128    Lab Results:  Results for orders placed or performed during the hospital encounter of 12/12/14 (from the past 48 hour(s))  Glucose, capillary     Status: Abnormal   Collection Time: 12/12/14  5:27 PM  Result Value Ref Range   Glucose-Capillary 49 (L) 70 - 99 mg/dL   Comment 1 Notify RN   Glucose, capillary     Status: Abnormal   Collection Time: 12/12/14  5:34 PM  Result Value Ref Range   Glucose-Capillary 57 (L) 70 - 99 mg/dL  Glucose, capillary     Status: Abnormal   Collection Time: 12/12/14  8:38 PM  Result Value Ref Range   Glucose-Capillary 176 (H) 70 - 99 mg/dL   Comment 1 Notify RN   Glucose, capillary     Status: Abnormal   Collection Time: 12/13/14  6:00 AM  Result Value Ref Range   Glucose-Capillary 119 (H) 70 - 99 mg/dL  Glucose, capillary     Status: Abnormal   Collection Time: 12/13/14 11:39 AM  Result Value Ref Range   Glucose-Capillary 62 (L) 70 - 99 mg/dL   Comment 1 Document in Chart    Comment 2 Repeat Test   Glucose, capillary     Status: Abnormal  Collection Time: 12/13/14 11:51 AM  Result Value Ref Range   Glucose-Capillary 61 (L) 70 - 99 mg/dL  Glucose, capillary     Status: Abnormal   Collection Time: 12/13/14 12:08 PM  Result Value Ref Range   Glucose-Capillary 69 (L) 70 - 99 mg/dL  Glucose, capillary     Status:  None   Collection Time: 12/13/14 12:25 PM  Result Value Ref Range   Glucose-Capillary 96 70 - 99 mg/dL  Glucose, capillary     Status: Abnormal   Collection Time: 12/13/14  4:51 PM  Result Value Ref Range   Glucose-Capillary 106 (H) 70 - 99 mg/dL  Glucose, capillary     Status: Abnormal   Collection Time: 12/13/14  8:47 PM  Result Value Ref Range   Glucose-Capillary 189 (H) 70 - 99 mg/dL   Comment 1 Notify RN   Glucose, capillary     Status: Abnormal   Collection Time: 12/14/14  6:02 AM  Result Value Ref Range   Glucose-Capillary 127 (H) 70 - 99 mg/dL   Comment 1 Notify RN   Comprehensive metabolic panel     Status: Abnormal   Collection Time: 12/14/14  6:55 AM  Result Value Ref Range   Sodium 135 135 - 145 mmol/L   Potassium 4.1 3.5 - 5.1 mmol/L   Chloride 107 96 - 112 mmol/L   CO2 24 19 - 32 mmol/L   Glucose, Bld 125 (H) 70 - 99 mg/dL   BUN 22 6 - 23 mg/dL   Creatinine, Ser 1.41 (H) 0.50 - 1.35 mg/dL   Calcium 8.6 8.4 - 10.5 mg/dL   Total Protein 7.5 6.0 - 8.3 g/dL   Albumin 4.0 3.5 - 5.2 g/dL   AST 24 0 - 37 U/L   ALT 19 0 - 53 U/L   Alkaline Phosphatase 57 39 - 117 U/L   Total Bilirubin 0.4 0.3 - 1.2 mg/dL   GFR calc non Af Amer 51 (L) >90 mL/min   GFR calc Af Amer 59 (L) >90 mL/min    Comment: (NOTE) The eGFR has been calculated using the CKD EPI equation. This calculation has not been validated in all clinical situations. eGFR's persistently <90 mL/min signify possible Chronic Kidney Disease.    Anion gap 4 (L) 5 - 15    Comment: Performed at [facility]  Glucose, capillary     Status: Abnormal   Collection Time: 12/14/14 11:46 AM  Result Value Ref Range   Glucose-Capillary 120 (H) 70 - 99 mg/dL    Physical Findings: AIMS: Facial and Oral Movements Muscles of Facial Expression: None, normal Lips and Perioral Area: None, normal Jaw: None, normal Tongue: None, normal,Extremity Movements Upper (arms, wrists, hands, fingers): None,  normal Lower (legs, knees, ankles, toes): None, normal, Trunk Movements Neck, shoulders, hips: None, normal, Overall Severity Severity of abnormal movements (highest score from questions above): None, normal Incapacitation due to abnormal movements: None, normal Patient's awareness of abnormal movements (rate only patient's report): No Awareness, Dental Status Current problems with teeth and/or dentures?: No Does patient usually wear dentures?: No  CIWA:    COWS:  COWS Total Score: 0  Assessment: Pt is a 53 y old AAM with hx of HIV,DM,HTN,as well as polysubstance abuse presented with depression and SI after relapsing on cocaine. Pt today with multiple somatic complaints.     Treatment Plan Summary: Daily contact with patient to assess and evaluate symptoms and progress in treatment and Medication management Will increase Wellbutrin to  SR 100 mg po BID  for affective symptoms. Will continue Seroquel 50 mg po qhs for psychosis as well as sleep. Will continue to monitor vitals ,medication compliance and treatment side effects while patient is here.  Patient with abnormal creatinine level - will consult hospitalist.  Patient with uncontrolled diabetes- will follow diabetic coordinator recommendations. Reviewed labs ,will order as needed.  CSW will start working on disposition.  Patient to participate in therapeutic milieu .    Medical Decision Making:  Review of Psycho-Social Stressors (1), Review or order clinical lab tests (1), Review of Last Therapy Session (1), Review or order medicine tests (1), Review of Medication Regimen & Side Effects (2) and Review of New Medication or Change in Dosage (2)     ,  MD 12/14/2014, 2:30 PM

## 2014-12-15 ENCOUNTER — Ambulatory Visit (HOSPITAL_COMMUNITY)
Admit: 2014-12-15 | Discharge: 2014-12-15 | Disposition: A | Discharge status: Home / Self Care | Payer: Medicare Other | Source: Ambulatory Visit | Attending: Internal Medicine | Admitting: Internal Medicine

## 2014-12-15 DIAGNOSIS — G319 Degenerative disease of nervous system, unspecified: Secondary | ICD-10-CM | POA: Diagnosis not present

## 2014-12-15 DIAGNOSIS — D1779 Benign lipomatous neoplasm of other sites: Secondary | ICD-10-CM | POA: Diagnosis not present

## 2014-12-15 DIAGNOSIS — H532 Diplopia: Secondary | ICD-10-CM | POA: Diagnosis not present

## 2014-12-15 DIAGNOSIS — R4781 Slurred speech: Secondary | ICD-10-CM | POA: Insufficient documentation

## 2014-12-15 DIAGNOSIS — F1420 Cocaine dependence, uncomplicated: Secondary | ICD-10-CM

## 2014-12-15 LAB — GLUCOSE, CAPILLARY
GLUCOSE-CAPILLARY: 75 mg/dL (ref 70–99)
GLUCOSE-CAPILLARY: 113 mg/dL — AB (ref 70–99)
Glucose-Capillary: 89 mg/dL (ref 70–99)
Glucose-Capillary: 165 mg/dL — ABNORMAL HIGH (ref 70–99)

## 2014-12-15 LAB — URINALYSIS, ROUTINE W REFLEX MICROSCOPIC
Bilirubin Urine: NEGATIVE
Glucose, UA: 100 mg/dL — AB
Hgb urine dipstick: NEGATIVE
Ketones, ur: NEGATIVE mg/dL
Leukocytes, UA: NEGATIVE
NITRITE: NEGATIVE
Protein, ur: NEGATIVE mg/dL
Specific Gravity, Urine: 1.02 (ref 1.005–1.030)
UROBILINOGEN UA: 0.2 mg/dL (ref 0.0–1.0)
pH: 5 (ref 5.0–8.0)

## 2014-12-15 LAB — TSH: TSH: 1.821 u[IU]/mL (ref 0.350–4.500)

## 2014-12-15 LAB — BASIC METABOLIC PANEL
Anion gap: 7 (ref 5–15)
BUN: 21 mg/dL (ref 6–23)
CO2: 22 mmol/L (ref 19–32)
Calcium: 8.9 mg/dL (ref 8.4–10.5)
Chloride: 112 mmol/L (ref 96–112)
Creatinine, Ser: 1.28 mg/dL (ref 0.50–1.35)
GFR calc non Af Amer: 58 mL/min — ABNORMAL LOW (ref >90)
GFR, EST AFRICAN AMERICAN: 67 mL/min — AB (ref >90)
Glucose, Bld: 103 mg/dL — ABNORMAL HIGH (ref 70–99)
Potassium: 4 mmol/L (ref 3.5–5.1)
Sodium: 141 mmol/L (ref 135–145)

## 2014-12-15 LAB — CK: CK TOTAL: 150 U/L (ref 7–232)

## 2014-12-15 MED ORDER — HYDROXYZINE HCL 25 MG PO TABS
25.0000 mg | ORAL_TABLET | Freq: Four times a day (QID) | ORAL | Status: DC | PRN
  Administered 2014-12-16 – 2014-12-18 (×3): 25 mg via ORAL
  Filled 2014-12-15 (×3): qty 1

## 2014-12-15 MED ORDER — PANTOPRAZOLE SODIUM 40 MG PO TBEC: 40.0000 mg | DELAYED_RELEASE_TABLET | Freq: Every day | ORAL | Status: DC

## 2014-12-15 MED ORDER — PANTOPRAZOLE SODIUM 40 MG PO TBEC
40.0000 mg | DELAYED_RELEASE_TABLET | Freq: Two times a day (BID) | ORAL | Status: DC
Start: 2014-12-15 — End: 2014-12-15

## 2014-12-15 MED ORDER — HYDROXYZINE HCL 50 MG PO TABS
50.0000 mg | ORAL_TABLET | Freq: Once | ORAL | Status: AC
  Administered 2014-12-15: 50 mg via ORAL
  Filled 2014-12-15 (×2): qty 1

## 2014-12-15 NOTE — [facility] Group Notes (Signed)
[place] LCSW Group Therapy  12/15/2014 3:58 PM   Type of Therapy:  Group Therapy  Participation Level:  Active  Participation Quality:  Attentive  Affect:  Appropriate  Cognitive:  Appropriate  Insight:  Improving  Engagement in Therapy:  Engaged  Modes of Intervention:  Clarification, Education, Exploration and Socialization  Summary of Progress/Problems: Today's group focused on relapse prevention.  We defined the term, and then brainstormed on ways to prevent relapse.  Michael [name] talked about how one can relapse in one's mind before actually drinking or doing drugs.  "It's premeditated, and then you start craving it and there's no turning back."  Others agreed.  Was not willing to accept that it is possible to not relapse under those conditions. On a more positive note, talked about needing to do some things differently, like getting a payee to help with money so that is not a trigger for him at the beginning of the month.  Also, finding ways to stay occupied during the day.  Michael [name] 12/15/2014 , 3:58 PM

## 2014-12-15 NOTE — Progress Notes (Signed)
[facility] MD Progress Note  12/15/2014 2:54 PM LEEROY LOVINGS  MRN:  [id number] Subjective: Patient states 'I am on a lot of medications , I am not sure why.'  Objective; Patient seen and chart reviewed.Patient discussed with treatment team. Pt presented with worsening depression , after relapsing on cocaine. Pt uses a lot - used 500$ worth recently.Pt with diagnosis of HIV , loneliness is the main trigger.  Pt today continues to be withdrawn and isolative . Pt was seen by hospitalist for his double vision as well as elevated creatinine level. Pt today with some improvement of his depression. Reports sleep as good eventhough sleep is documented as 3.75 hrs in EHR.  Pt with SI that is passive, which is improving . Pt is compliant on medications. Denies side effects. Pt reports AH as improving . Pt is alert, ox4, . Pt denies side effects of medications.   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 HIV disease [B20] 12/14/2014  . Diplopia [H53.2] 12/14/2014  . Cocaine use disorder, severe, dependence [F14.20] 12/13/2014  . Substance or medication-induced depressive disorder with onset during withdrawal [F19.94] 12/13/2014  . 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 DM (diabetes mellitus) [E11.9] 12/13/2014  . Cervicalgia [M54.2] 06/28/2014  . Lumbar radiculopathy, chronic [M54.16] 06/28/2014  . Asthma, chronic [J45.909] 02/03/2014  . 3-vessel CAD [I25.10]  06/24/2013  . Arrhythmia [I49.9] 11/13/2012  . ED (erectile dysfunction) of organic origin [N52.8] 07/07/2012  . Hypertension goal BP (blood pressure) < 140/80 [I10] 04/29/2012  . Chondromalacia of left knee [M94.262] 03/19/2012  . Hyperlipidemia with target LDL less than 100 [E78.5] 02/12/2012  . Fibromyalgia [M79.7] 02/12/2012  . Uncontrolled type 2 diabetes with neuropathy [E11.40, E11.65] 10/18/2011  . HIV (human immunodeficiency virus infection) [Z21] 08/27/2011   Total Time spent with patient: 30 minutes         Past Medical History:  Past Medical History  Diagnosis Date  . Diabetes mellitus     Diagnosed in 2002, started insulin in 2012  . Hypertension   . Headache(784.0)     CT head 08/2011: Periventricular and subcortical white matter hypodensities are most in keeping with chronic microangiopathic change  . Gout   . Hyperlipidemia   . HIV infection Nov 2012    Followed by [name]  . Crack cocaine use     for 20+ years, has been enrolled in detox programs in the past  . Chondromalacia of medial femoral condyle     Left knee MRI 04/28/12: Chondromalacia of the medial femoral condyle with slight peripheral degeneration of the meniscocapsular junction of the medial meniscus; followed by sports medicine  . Asthma     No PFTs, history of childhood asthma  . Depression     with history of hospitalization for suicidal ideation  . CAD (coronary artery disease)     a. 06/2013 STEMI/PCI ([facility]): LAD w/ thrombus (treated with BMS), mid 75%, D2 75%; LCX OM2 75%; RCA small, PDA 95%, PLV 95%;  b. 10/2013 Cath/PCI: ISR w/in LAD (Promus DES x 2), borderline OM2 lesion;  c. 01/2014 MV: Intermediate  risk, medium-sized distal ant wall infarct w/ very small amt of peri-infarct ischemia. EF 60%.  . Gout 04/28/2012  . Collagen vascular disease   . Cellulitis 04/2014    left facial    Past Surgical History  Procedure Laterality Date  . Prostate surgery    . Bowel resection    . Back  surgery      1988  . Cardiac surgery    . Cervical spine surgery      " rods in my neck "  . Coronary stent placement    . Nm myocar perf wall motion  12/27/2011    normal   Family History:  Family History  Problem Relation Age of Onset  . Diabetes Mother   . Diabetes Father   . Diabetes Brother   . Colon cancer Neg Hx    Social History:  History  Alcohol Use No     History  Drug Use  . Yes  . Special: "Crack" cocaine, Cocaine    Comment: last used crack 04/2014.    History   Social History  . Marital Status: Divorced    Spouse Name: N/A  . Number of Children: N/A  . Years of Education: N/A   Social History Main Topics  . Smoking status: Never Smoker   . Smokeless tobacco: Never Used  . Alcohol Use: No  . Drug Use: Yes    Special: "Crack" cocaine, Cocaine     Comment: last used crack 04/2014.  Marland Kitchen Sexual Activity: No     Comment: accepted condoms   Other Topics Concern  . None   Social History Narrative   Currently staying with a friend in [place].  Was staying @ local motel until a few days ago - left b/c of bed bugs.   Additional History:    Sleep: Fair  Appetite:  Fair    Musculoskeletal: Strength & Muscle Tone: within normal limits Gait & Station: normal Patient leans: N/A   Psychiatric Specialty Exam: Physical Exam  Review of Systems  Psychiatric/Behavioral: Positive for depression, hallucinations and substance abuse. The patient is nervous/anxious and has insomnia.     Blood pressure 115/72, pulse 82, temperature 98.1 F (36.7 C), temperature source Oral, resp. rate 18, height _0  (1.702 m), weight 77.111 kg (170 lb), SpO2 100 %.Body mass index is 26.62 kg/(m^2).  General Appearance: Casual  Eye Contact::  Fair  Speech:  Slow  Volume:  Decreased  Mood:  Anxious and Depressed improving  Affect:  Congruent  Thought Process:  Goal Directed  Orientation:  Full (Time, Place, and Person)  Thought Content:  Hallucinations: Auditory and  Rumination improving  Suicidal Thoughts:  No  Homicidal Thoughts:  No  Memory:  Immediate;   Fair Recent;   Fair Remote;   Fair  Judgement:  Fair  Insight:  Fair  Psychomotor Activity:  Normal  Concentration:  Fair  Recall:  [employer] of Knowledge:Fair  Language: Fair  Akathisia:  No  Handed:  Right  AIMS (if indicated):     Assets:  Communication Skills Desire for Improvement  ADL's:  Intact  Cognition: WNL  Sleep:  Number of Hours: 3.75     Current Medications: Current Facility-Administered Medications  Medication Dose Route Frequency Provider Last Rate Last Dose  . acetaminophen (TYLENOL) tablet 650 mg  650 mg Oral Q6H PRN Delfin Gant, NP   650 mg at 12/15/14 0808  . albuterol (PROVENTIL HFA;VENTOLIN HFA) 108 (90 BASE) MCG/ACT inhaler 2 puff  2 puff Inhalation Q6H PRN Delfin Gant, NP      . allopurinol (ZYLOPRIM) tablet 200 mg  200 mg Oral Daily Delfin Gant, NP   200 mg at 12/15/14 0804  . aspirin EC tablet 81 mg  81 mg Oral Daily Delfin Gant, NP   81 mg at 12/15/14 0804  . buPROPion (WELLBUTRIN SR) 12 hr tablet 100 mg  100 mg Oral BID Ursula Alert, MD   100 mg at 12/15/14 0804  . clopidogrel (PLAVIX) tablet 75 mg  75 mg Oral Daily Delfin Gant, NP   75 mg at 12/15/14 0805  . diclofenac sodium (VOLTAREN) 1 % transdermal gel 2 g  2 g Topical Daily PRN Delfin Gant, NP      . Emtricitab-Rilpivir-Tenofovir 200-25-300 MG TABS 1 tablet  1 tablet Oral Daily Delfin Gant, NP   1 tablet at 12/15/14 0809  . famotidine (PEPCID) tablet 20 mg  20 mg Oral QHS Delfin Gant, NP   20 mg at 12/14/14 2121  . insulin aspart (novoLOG) injection 0-15 Units  0-15 Units Subcutaneous TID WC Ursula Alert, MD   2 Units at 12/14/14 1727  . insulin aspart (novoLOG) injection 0-5 Units  0-5 Units Subcutaneous QHS Ursula Alert, MD   0 Units at 12/13/14 2200  . insulin glargine (LANTUS) injection 25 Units  25 Units Subcutaneous QHS Ursula Alert,  MD   25 Units at 12/14/14 2121  . ipratropium-albuterol (DUONEB) 0.5-2.5 (3) MG/3ML nebulizer solution 3 mL  3 mL Nebulization Q4H PRN Delfin Gant, NP      . isosorbide mononitrate (IMDUR) 24 hr tablet 30 mg  30 mg Oral Daily Delfin Gant, NP   30 mg at 12/15/14 0805  . lisinopril (PRINIVIL,ZESTRIL) tablet 10 mg  10 mg Oral Daily Delfin Gant, NP   10 mg at 12/15/14 0803  . metFORMIN (GLUCOPHAGE) tablet 1,000 mg  1,000 mg Oral BID WC Delfin Gant, NP   1,000 mg at 12/15/14 0803  . metoprolol succinate (TOPROL-XL) 24 hr tablet 50 mg  50 mg Oral Daily Delfin Gant, NP   50 mg at 12/15/14 0803  . pravastatin (PRAVACHOL) tablet 40 mg  40 mg Oral q1800 Delfin Gant, NP   40 mg at 12/14/14 1725  . QUEtiapine (SEROQUEL) tablet 50 mg  50 mg Oral QHS Ursula Alert, MD   50 mg at 12/14/14 2121    Lab Results:  Results for orders placed or performed during the hospital encounter of 12/12/14 (from the past 48 hour(s))  Glucose, capillary     Status: Abnormal   Collection Time: 12/13/14  4:51 PM  Result Value Ref Range   Glucose-Capillary 106 (H) 70 - 99 mg/dL  Glucose, capillary     Status: Abnormal   Collection Time: 12/13/14  8:47 PM  Result Value Ref Range   Glucose-Capillary 189 (H) 70 - 99 mg/dL   Comment 1 Notify RN   Glucose, capillary     Status: Abnormal   Collection Time: 12/14/14  6:02 AM  Result Value Ref Range   Glucose-Capillary 127 (H) 70 - 99 mg/dL   Comment 1 Notify RN   Comprehensive metabolic panel     Status: Abnormal   Collection Time: 12/14/14  6:55 AM  Result Value Ref Range   Sodium 135 135 - 145 mmol/L   Potassium 4.1 3.5 - 5.1 mmol/L   Chloride 107 96 - 112 mmol/L   CO2 24  19 - 32 mmol/L   Glucose, Bld 125 (H) 70 - 99 mg/dL   BUN 22 6 - 23 mg/dL   Creatinine, Ser 1.41 (H) 0.50 - 1.35 mg/dL   Calcium 8.6 8.4 - 10.5 mg/dL   Total Protein 7.5 6.0 - 8.3 g/dL   Albumin 4.0 3.5 - 5.2 g/dL   AST 24 0 - 37 U/L   ALT 19 0 - 53 U/L    Alkaline Phosphatase 57 39 - 117 U/L   Total Bilirubin 0.4 0.3 - 1.2 mg/dL   GFR calc non Af Amer 51 (L) >90 mL/min   GFR calc Af Amer 59 (L) >90 mL/min    Comment: (NOTE) The eGFR has been calculated using the CKD EPI equation. This calculation has not been validated in all clinical situations. eGFR's persistently <90 mL/min signify possible Chronic Kidney Disease.    Anion gap 4 (L) 5 - 15    Comment: Performed at [facility]  Glucose, capillary     Status: Abnormal   Collection Time: 12/14/14 11:46 AM  Result Value Ref Range   Glucose-Capillary 120 (H) 70 - 99 mg/dL  Glucose, capillary     Status: Abnormal   Collection Time: 12/14/14  5:11 PM  Result Value Ref Range   Glucose-Capillary 149 (H) 70 - 99 mg/dL   Comment 1 Document in Chart    Comment 2 Repeat Test   Urinalysis, Routine w reflex microscopic     Status: Abnormal   Collection Time: 12/14/14  8:18 PM  Result Value Ref Range   Color, Urine YELLOW YELLOW   APPearance CLEAR CLEAR   Specific Gravity, Urine 1.020 1.005 - 1.030   pH 5.0 5.0 - 8.0   Glucose, UA 100 (A) NEGATIVE mg/dL   Hgb urine dipstick NEGATIVE NEGATIVE   Bilirubin Urine NEGATIVE NEGATIVE   Ketones, ur NEGATIVE NEGATIVE mg/dL   Protein, ur NEGATIVE NEGATIVE mg/dL   Urobilinogen, UA 0.2 0.0 - 1.0 mg/dL   Nitrite NEGATIVE NEGATIVE   Leukocytes, UA NEGATIVE NEGATIVE    Comment: MICROSCOPIC NOT DONE ON URINES WITH NEGATIVE PROTEIN, BLOOD, LEUKOCYTES, NITRITE, OR GLUCOSE <1000 mg/dL. Performed at [facility]   Glucose, capillary     Status: Abnormal   Collection Time: 12/14/14  8:49 PM  Result Value Ref Range   Glucose-Capillary 145 (H) 70 - 99 mg/dL  Glucose, capillary     Status: None   Collection Time: 12/15/14  6:05 AM  Result Value Ref Range   Glucose-Capillary 75 70 - 99 mg/dL  TSH     Status: None   Collection Time: 12/15/14  6:50 AM  Result Value Ref Range   TSH 1.821 0.350 - 4.500 uIU/mL    Comment:  Performed at [place] metabolic panel     Status: Abnormal   Collection Time: 12/15/14  6:50 AM  Result Value Ref Range   Sodium 141 135 - 145 mmol/L   Potassium 4.0 3.5 - 5.1 mmol/L   Chloride 112 96 - 112 mmol/L   CO2 22 19 - 32 mmol/L   Glucose, Bld 103 (H) 70 - 99 mg/dL   BUN 21 6 - 23 mg/dL   Creatinine, Ser 1.28 0.50 - 1.35 mg/dL   Calcium 8.9 8.4 - 10.5 mg/dL   GFR calc non Af Amer 58 (L) >90 mL/min   GFR calc Af Amer 67 (L) >90 mL/min    Comment: (NOTE) The eGFR has been calculated using the  CKD EPI equation. This calculation has not been validated in all clinical situations. eGFR's persistently <90 mL/min signify possible Chronic Kidney Disease.    Anion gap 7 5 - 15    Comment: Performed at [facility]  CK     Status: None   Collection Time: 12/15/14  6:50 AM  Result Value Ref Range   Total CK 150 7 - 232 U/L    Comment: Performed at [facility]  Glucose, capillary     Status: None   Collection Time: 12/15/14 11:44 AM  Result Value Ref Range   Glucose-Capillary 89 70 - 99 mg/dL    Physical Findings: AIMS: Facial and Oral Movements Muscles of Facial Expression: None, normal Lips and Perioral Area: None, normal Jaw: None, normal Tongue: None, normal,Extremity Movements Upper (arms, wrists, hands, fingers): None, normal Lower (legs, knees, ankles, toes): None, normal, Trunk Movements Neck, shoulders, hips: None, normal, Overall Severity Severity of abnormal movements (highest score from questions above): None, normal Incapacitation due to abnormal movements: None, normal Patient's awareness of abnormal movements (rate only patient's report): No Awareness, Dental Status Current problems with teeth and/or dentures?: No Does patient usually wear dentures?: No  CIWA:    COWS:  COWS Total Score: 0  Assessment: Pt is a 57 y old AAM with hx of HIV,DM,HTN,as well as polysubstance abuse presented with  depression and SI after relapsing on cocaine. Pt today with some improvement of his depression, but continues to be isolative.     Treatment Plan Summary: Daily contact with patient to assess and evaluate symptoms and progress in treatment and Medication management Will continue Wellbutrin to SR 100 mg po BID  for affective symptoms. Will continue Seroquel 50 mg po qhs for psychosis as well as sleep. Will continue to monitor vitals ,medication compliance and treatment side effects while patient is here.  Patient with abnormal creatinine level - will consulted hospitalist see consult note - pt to get MRI scan. Patient with uncontrolled diabetes- will follow diabetic coordinator recommendations.  CSW will start working on disposition.  Patient to participate in therapeutic milieu .    Medical Decision Making:  Review of Psycho-Social Stressors (1), Review or order clinical lab tests (1), Review of Last Therapy Session (1), Review or order medicine tests (1), Review of Medication Regimen & Side Effects (2) and Review of New Medication or Change in Dosage (2)     Kingdavid Leinbach  MD 12/15/2014, 2:54 PM

## 2014-12-15 NOTE — Progress Notes (Signed)
  [facility] Adult Case Management Discharge Plan :  Will you be returning to the same living situation after discharge:  Yes,  home At discharge, do you have transportation home?: Yes,  bus pass Do you have the ability to pay for your medications: Yes,  MCR  Release of information consent forms completed and in the chart;  Patient's signature needed at discharge.  Patient to Follow up at: Follow-up Information    Follow up with [facility].   Why:  Go to the walk-in clinic M-F between 8 and 10 AM for your hospital follow up appointment   Contact information:   [place] [phone]      Follow up with [facility] On 12/21/2014.   Why:  1:45 PM      Patient denies SI/HI: Yes,  yes    Safety Planning and Suicide Prevention discussed: Yes,  yes  Have you used any form of tobacco in the last 30 days? (Cigarettes, Smokeless Tobacco, Cigars, and/or Pipes): No  Has patient been referred to the Quitline?: N/A patient is not a smoker  Michael, Barbaraann Rondo [name] 12/15/2014, 4:20 PM

## 2014-12-15 NOTE — [facility] Suicide Risk Assessment (Signed)
[place] INPATIENT:  Family/Significant Other Suicide Prevention Education  Suicide Prevention Education:  Education Completed; Marti Sleigh, sister, [phone]  has been identified by the patient as the family member/significant other with whom the patient will be residing, and identified as the person(s) who will aid the patient in the event of a mental health crisis (suicidal ideations/suicide attempt).  With written consent from the patient, the family member/significant other has been provided the following suicide prevention education, prior to the and/or following the discharge of the patient.  The suicide prevention education provided includes the following:  Suicide risk factors  Suicide prevention and interventions  National Suicide Hotline telephone number  [facility] assessment telephone number  [facility] Emergency Assistance [place] and/or Residential Mobile Crisis Unit telephone number  Request made of family/significant other to:  Remove weapons (e.g., guns, rifles, knives), all items previously/currently identified as safety concern.    Remove drugs/medications (over-the-counter, prescriptions, illicit drugs), all items previously/currently identified as a safety concern.  The family member/significant other verbalizes understanding of the suicide prevention education information provided.  The family member/significant other agrees to remove the items of safety concern listed above.  Trish Mage 12/15/2014, 4:47 PM

## 2014-12-15 NOTE — Plan of Care (Signed)
Problem: Alteration in mood & ability to function due to Goal: STG-Patient will comply with prescribed medication regimen (Patient will comply with prescribed medication regimen)  Outcome: Progressing Pt has been compliant with current medication regimen thus far. Denied adverse drug reactions when assessed.

## 2014-12-15 NOTE — Progress Notes (Signed)
Patient ID: Michael [name], male   DOB: 1951-04-13, 64 y.o.   MRN: [id number]  Adult Psychoeducational Group Note  Date:  12/15/2014 Time: 09:15  Group Topic/Focus:  Orientation:   The focus of this group is to educate the patient on the purpose and policies of crisis stabilization and provide a format to answer questions about their admission.  The group details unit policies and expectations of patients while admitted.  Participation Level:  Active  Participation Quality:  Appropriate  Affect:  Appropriate  Cognitive:  Appropriate  Insight: Appropriate  Engagement in Group:  Improving  Modes of Intervention:  Discussion, Education and Support  Additional Comments:  Pt able to identify one short and long term goal.   Elenore Rota 12/15/2014, 12:29 PM

## 2014-12-15 NOTE — Progress Notes (Signed)
[facility]                                                                              Patient Demographics  Michael [name], is a 64 y.o. male, DOB - 1951-06-13, [id number]  Admit date - 12/12/2014   Admitting Physician Ursula Alert, MD  Outpatient Primary MD for the patient is [name], MD  LOS - 3   No chief complaint on file.      Assessment & Plan   Diplopia -low clinical suspicion of opportunistic infection or cerebral ischemia -Pending MRI of brain.  (Has history of HIV and CV disease and intermittent slurred speech and cocaine use) -no epidemiologic evidence of atypical infection -If workup is negative, patient will need optometry/ophthalmology follow-up in the outpatient setting -TSH 1.821 (normal)  Left Upper extremity weakness/left lower extremity weakness -Patient has had cervical and lumbar fusion secondary to spinal stenosis -Would recommend PT  CKD stage II -Upon initial consultation, Cr 1.4- currently 1.28 (at baseline of 1.2) -Likely due to diabetes -Certainly, patient may have a component of membranous nephropathy from HIV -creatinine has been stable since July 2015 -Will need routine monitoring as pt is on metformin and lisinopril-->continue for now as renal function appears stable -Patient also has history of NSAID use as an outpatient -tenofovir part of Complera may also cause renal insufficiency -CK 150  Diabetes mellitus type 2 -CBG is fairly well controlled -Hemoglobin A1c pending -05/01/2014 hemoglobin A1c 11.4 -Continue present dose of Lantus and NovoLog sliding scale -Monitor renal function on metformin  HIV Disease -continue Complera -Patient should continue have his renal function monitored as an outpatient. -09/06/2014 CD4 count 570/36% -HIV RNA 83 copies  Hypertension -Continue metoprolol and lisinopril -Controlled  Hyperlipidemia -Continue statin  Coronary artery disease with history of stents -Continue  aspirin, Plavix, metoprolol succinate, Imdur  Code Status: Full  Family Communication: None at bedside.  Disposition Plan: Admitted to [facility].  Please note, Patient not seen today, however chart reviewed. Pending MRI.  Will continue to follow with you.   Time Spent in minutes   20 minutes  Procedures  None  Consults   TRH  DVT Prophylaxis  Ambulatory  Lab Results  Component Value Date   PLT 173 12/10/2014    Medications  Scheduled Meds: . allopurinol  200 mg Oral Daily  . aspirin EC  81 mg Oral Daily  . buPROPion  100 mg Oral BID  . clopidogrel  75 mg Oral Daily  . Emtricitab-Rilpivir-Tenofovir  1 tablet Oral Daily  . famotidine  20 mg Oral QHS  . insulin aspart  0-15 Units Subcutaneous TID WC  . insulin aspart  0-5 Units Subcutaneous QHS  . insulin glargine  25 Units Subcutaneous QHS  . isosorbide mononitrate  30 mg Oral Daily  . lisinopril  10 mg Oral Daily  . metFORMIN  1,000 mg Oral BID WC  . metoprolol succinate  50 mg Oral Daily  . pravastatin  40 mg Oral q1800  . QUEtiapine  50 mg Oral QHS   Continuous Infusions:  PRN Meds:.acetaminophen, albuterol, diclofenac sodium, ipratropium-albuterol  Antibiotics   Anti-infectives    Start     Dose/Rate Route Frequency  Ordered Stop   12/12/14 1745  Emtricitab-Rilpivir-Tenofovir 200-25-300 MG TABS 1 tablet     1 tablet Oral Daily 12/12/14 1653        Katty Fretwell D.O. on 12/15/2014 at 11:55 AM  Between 7am to 7pm - Pager - [phone]  After 7pm go to [email or web address] - password [email or web address]  And look for the night coverage person covering for me after hours  [facility] Office  [phone]

## 2014-12-15 NOTE — Progress Notes (Signed)
D: Pt alert and calm. Cooperative with care and unit routines thus far this shift. Pt reported his depression 6/10, hopelessness 5/10 and anxiety level 6/10 on self inventory sheet.   A: Medication given as per MD's orders including PRN Tylenol for c/o pain. MRI (brain without contrast) was scheduled initially for 1730 this evening by writer, however, [facility] radiology department called and rescheduled pt's procedure for 1930 due to heavy case load from [place]. Emotional support and availability offered. Safety maintained on Q 15 minutes checks for suicide without gestures or event to note at this time.   R: Pt receptive to care. Remains compliant with current treatment regimen. Presents with animated affect but depressed mood. Reports passive SI but contracts for safety while hospitalized. Pt agreed to approach staff with urge to harm self to promote safety. Denied HI, AVH at time of assessment. Pt attended groups as scheduled. Pt was in agreement with change of time of his procedure when informed. Pt remains safe on / off unit. Denied adverse drug reactions. POC continues.

## 2014-12-15 NOTE — Tx Team (Signed)
  Interdisciplinary Treatment Plan Update   Date Reviewed:  12/15/2014  Time Reviewed:  3:50 PM  Progress in Treatment:   Attending groups: Yes Participating in groups: Yes Taking medication as prescribed: Yes  Tolerating medication: Yes Family/Significant other contact made: Yes  Patient understands diagnosis: Yes  Discussing patient identified problems/goals with staff: Yes  See initial care plan Medical problems stabilized or resolved: Yes Denies suicidal/homicidal ideation: Yes  In tx team Patient has not harmed self or others: Yes  For review of initial/current patient goals, please see plan of care.  Estimated Length of Stay:  4-5 days  Reason for Continuation of Hospitalization: Depression Medical Issues Medication stabilization  New Problems/Goals identified:  N/A  Discharge Plan or Barriers:   return home, follow up outpt  Additional Comments:  Patient with abnormal creatinine level - consulted hospitalist see consult note - pt to get MRI scan.  Pt continues with depression, is somatically focused.  Tolerating medications.  Active in milieu.  Attendees:  Signature: Steva Colder, MD 12/15/2014 3:50 PM   Signature: Ripley Fraise, [place] 12/15/2014 3:50 PM  Signature: 12/15/2014 3:50 PM  Signature: Janann August, RN 12/15/2014 3:50 PM  Signature:  12/15/2014 3:50 PM  Signature:  12/15/2014 3:50 PM  Signature:   12/15/2014 3:50 PM  Signature:    Signature:    Signature:    Signature:    Signature:    Signature:      Scribe for Treatment Team:   Ripley Fraise, LCSW  12/15/2014 3:50 PM

## 2014-12-16 DIAGNOSIS — F333 Major depressive disorder, recurrent, severe with psychotic symptoms: Principal | ICD-10-CM

## 2014-12-16 DIAGNOSIS — B20 Human immunodeficiency virus [HIV] disease: Secondary | ICD-10-CM

## 2014-12-16 DIAGNOSIS — F19251 Other psychoactive substance dependence with psychoactive substance-induced psychotic disorder with hallucinations: Secondary | ICD-10-CM

## 2014-12-16 DIAGNOSIS — E118 Type 2 diabetes mellitus with unspecified complications: Secondary | ICD-10-CM

## 2014-12-16 LAB — GLUCOSE, CAPILLARY
GLUCOSE-CAPILLARY: 85 mg/dL (ref 70–99)
Glucose-Capillary: 133 mg/dL — ABNORMAL HIGH (ref 70–99)

## 2014-12-16 LAB — HEMOGLOBIN A1C
HEMOGLOBIN A1C: 8.2 % — AB (ref 4.8–5.6)
MEAN PLASMA GLUCOSE: 189 mg/dL

## 2014-12-16 MED ORDER — ONDANSETRON 4 MG PO TBDP
4.0000 mg | ORAL_TABLET | Freq: Three times a day (TID) | ORAL | Status: DC | PRN
Start: 2014-12-16 — End: 2014-12-20
  Administered 2014-12-16 – 2014-12-17 (×2): 4 mg via ORAL
  Filled 2014-12-16 (×3): qty 1

## 2014-12-16 MED ORDER — LIDOCAINE 5 % EX PTCH
2.0000 | MEDICATED_PATCH | CUTANEOUS | Status: DC
  Administered 2014-12-16 – 2014-12-20 (×5): 2 via TRANSDERMAL
  Filled 2014-12-16 (×6): qty 2
  Filled 2014-12-16: qty 6
  Filled 2014-12-16: qty 2

## 2014-12-16 NOTE — Progress Notes (Signed)
[facility]                                                                              Patient Demographics  Michael [name], is a 64 y.o. male, DOB - 05-22-1951, [id number]  Admit date - 12/12/2014   Admitting Physician Ursula Alert, MD  Outpatient Primary MD for the patient is [name], MD  LOS - 4   No chief complaint on file.       Assessment & Plan  Diplopia -Has been ongoing for 8 months -Patient has a history of poorly controlled diabetes and HTN, he has never seen an ophthalmologist -low clinical suspicion of opportunistic infection or cerebral ischemia -MRI brain: No acute intracranial normality, mild to moderate chronic small vessel disease, incidental 7 mm midline lipoma -Patient has no neurological deficits at this time. -no epidemiologic evidence of atypical infection -If patient continues to have visual problems, would recommend outpatient follow-up with ophthalmology or optometry. -TSH 1.821 (normal)  Left Upper extremity weakness/left lower extremity weakness -Patient has had cervical and lumbar fusion secondary to spinal stenosis -Would recommend PT  CKD stage II -Upon initial consultation, Cr 1.4- currently 1.28 (at baseline of 1.2) -Likely due to diabetes -Certainly, patient may have a component of membranous nephropathy from HIV -creatinine has been stable since July 2015 -Will need routine monitoring as pt is on metformin and lisinopril-->continue for now as renal function appears stable -Patient also has history of NSAID use as an outpatient -tenofovir part of Complera may also cause renal insufficiency -CK 150  Diabetes mellitus type 2 -CBG is fairly well controlled -Hemoglobin A1c 8.2 -05/01/2014 hemoglobin A1c 11.4 -Continue present dose of Lantus and NovoLog sliding scale -Monitor renal function on metformin -Diabetes coordinator consulted and appreciated  HIV Disease -continue Complera -Patient should continue have his  renal function monitored as an outpatient. -09/06/2014 CD4 count 570/36% -HIV RNA 83 copies  Hypertension -Continue metoprolol and lisinopril -Controlled  Hyperlipidemia -Continue statin  Coronary artery disease with history of stents -Continue aspirin, Plavix, metoprolol succinate, Imdur  Code Status: Full  Family Communication: None at bedside  Disposition Plan: Admitted to behavioral health.  MRI findings were negative.  No further inpatient workup needed at this time.  Will sign off. Please call with questions.    Time Spent in minutes   30 minutes  Procedures  None  Consults   TRH   Lab Results  Component Value Date   PLT 173 12/10/2014    Medications  Scheduled Meds: . allopurinol  200 mg Oral Daily  . aspirin EC  81 mg Oral Daily  . buPROPion  100 mg Oral BID  . clopidogrel  75 mg Oral Daily  . Emtricitab-Rilpivir-Tenofovir  1 tablet Oral Daily  . insulin aspart  0-15 Units Subcutaneous TID WC  . insulin aspart  0-5 Units Subcutaneous QHS  . insulin glargine  25 Units Subcutaneous QHS  . isosorbide mononitrate  30 mg Oral Daily  . lidocaine  2 patch Transdermal Q24H  . lisinopril  10 mg Oral Daily  . metFORMIN  1,000 mg Oral BID WC  . metoprolol succinate  50 mg Oral Daily  . pravastatin  40 mg  Oral q1800  . QUEtiapine  50 mg Oral QHS   Continuous Infusions:  PRN Meds:.acetaminophen, albuterol, diclofenac sodium, hydrOXYzine, ipratropium-albuterol, ondansetron  Antibiotics    Anti-infectives    Start     Dose/Rate Route Frequency Ordered Stop   12/12/14 1745  Emtricitab-Rilpivir-Tenofovir 200-25-300 MG TABS 1 tablet     1 tablet Oral Daily 12/12/14 1653          Subjective:   Michael [name] seen and examined today.  Patient continues to complain of double/blurry vision, but has been ongoing for more than 8 months.  Patient denies painful eye movements, headache, problems ambulating, dizziness, chest pain, shortness of breath.  He inquires if  his medications can cause impotence.      Objective:   Filed Vitals:   12/15/14 0616 12/15/14 0803 12/16/14 0700 12/16/14 0701  BP: 115/72 115/72 117/71 136/79  Pulse: 82 82 59 94  Temp:   97.9 F (36.6 C)   TempSrc:   Oral   Resp:   18   Height:      Weight:      SpO2:        Wt Readings from Last 3 Encounters:  12/12/14 77.111 kg (170 lb)  12/15/14 78.019 kg (172 lb)  10/19/14 79.833 kg (176 lb)    No intake or output data in the 24 hours ending 12/16/14 1353  Exam  General: Well developed, well nourished  HEENT: NCAT, PERRLA, EOMI, Anicteic Sclera, mucous membranes moist.   Cardiovascular: S1 S2 auscultated, RRR  Respiratory: Clear to auscultation   Abdomen: Soft, nontender, nondistended, + bowel sounds  Extremities: warm dry without cyanosis clubbing or edema  Neuro: AAOx3, nonfocal  Psych: Appropriate  Data Review   Micro Results No results found for this or any previous visit (from the past 240 hour(s)).  Radiology Reports Dg Chest 2 View  12/10/2014   CLINICAL DATA:  Shortness of breath and cough.  EXAM: CHEST - 2 VIEW  COMPARISON:  06/25/2014  FINDINGS: The heart size and mediastinal contours are within normal limits. There is no evidence of pulmonary edema, consolidation, pneumothorax, nodule or pleural fluid. Stable mild spondylosis of the thoracic spine.  IMPRESSION: No active disease.   Electronically Signed   By: Aletta Edouard M.D.   On: 12/10/2014 09:21   Mr Brain Wo Contrast  12/16/2014   CLINICAL DATA:  Worsening diplopia and intermittent slurred speech. HIV.  EXAM: MRI HEAD WITHOUT CONTRAST  TECHNIQUE: Multiplanar, multiecho pulse sequences of the brain and surrounding structures were obtained without intravenous contrast.  COMPARISON:  Head CT 04/25/2014  FINDINGS: The visualized portion of the upper cervical spine again demonstrates prior C4-5 intervertebral fusion and moderate to severe disc degeneration at C3-4 resulting in spinal stenosis  and impression on the spinal cord, incompletely evaluated.  There is no evidence of acute infarct, intracranial hemorrhage, intra-axial mass, midline shift, or extra-axial fluid collection. A 7 mm lipoma is again seen associated with the inferior aspect of the septum pellucidum near the foramen of Monro, unchanged. There is no hydrocephalus. There is mild to moderate generalized cerebral atrophy, unchanged. Patchy T2 hyperintensities in the subcortical and periventricular white matter are nonspecific but compatible with mild to moderate chronic small vessel ischemic disease. Mild chronic ischemic changes are also noted in the pons.  Orbits are unremarkable. Paranasal sinuses and mastoid air cells are clear. Major intracranial vascular flow voids are preserved. A small effusion is noted at the right atlanto-occipital articulation.  IMPRESSION: 1. No acute  intracranial abnormality. 2. Mild-to-moderate chronic small vessel ischemic disease and cerebral atrophy. 3. Incidental 7 mm midline lipoma.   Electronically Signed   By: Logan Bores   On: 12/16/2014 08:36    CBC  Recent Labs Lab 12/10/14 0832  WBC 4.3  HGB 12.1*  HCT 37.2*  PLT 173  MCV 81.9  MCH 26.7  MCHC 32.5  RDW 14.0    Chemistries   Recent Labs Lab 12/10/14 0832 12/14/14 0655 12/15/14 0650  NA 137 135 141  K 4.1 4.1 4.0  CL 105 107 112  CO2 24 24 22   GLUCOSE 225* 125* 103*  BUN 21 22 21   CREATININE 1.36* 1.41* 1.28  CALCIUM 9.4 8.6 8.9  AST 53* 24  --   ALT 24 19  --   ALKPHOS 62 57  --   BILITOT 1.0 0.4  --    ------------------------------------------------------------------------------------------------------------------ estimated creatinine clearance is 54.5 mL/min (by C-G formula based on Cr of 1.28). ------------------------------------------------------------------------------------------------------------------  Recent Labs  12/15/14 0650  HGBA1C 8.2*    ------------------------------------------------------------------------------------------------------------------ No results for input(s): CHOL, HDL, LDLCALC, TRIG, CHOLHDL, LDLDIRECT in the last 72 hours. ------------------------------------------------------------------------------------------------------------------  Recent Labs  12/15/14 0650  TSH 1.821   ------------------------------------------------------------------------------------------------------------------ No results for input(s): VITAMINB12, FOLATE, FERRITIN, TIBC, IRON, RETICCTPCT in the last 72 hours.  Coagulation profile No results for input(s): INR, PROTIME in the last 168 hours.  No results for input(s): DDIMER in the last 72 hours.  Cardiac Enzymes No results for input(s): CKMB, TROPONINI, MYOGLOBIN in the last 168 hours.  Invalid input(s): CK ------------------------------------------------------------------------------------------------------------------ Invalid input(s): POCBNP    Saylah Ketner D.O. on 12/16/2014 at 1:53 PM  Between 7am to 7pm - Pager - [phone]  After 7pm go to [email or web address] - password [email or web address]  And look for the night coverage person covering for me after hours  [facility] Office  [phone]

## 2014-12-16 NOTE — Progress Notes (Addendum)
D: Pt has anxious affect and mood.  Pt reports his goal today was "I've been working on my sugar level because it has been going low."  Pt reports passive SI without a plan.  He verbally contracts for safety.  Pt denies HI, denies hallucinations.  Pt reports his pain level as 8/10 "but the patch does pretty good."  Pt reports he would like to have a prescription for lidocaine patches when he discharges.  Pt has been visible in the milieu and was watching television with peers.  He interacts appropriately with peers and staff. A: Medications administered per order.  Safety maintained.  PRN medication administered for anxiety, see flowsheet.  CBG was 133 mg/dL tonight, no insulin coverage administered.  Met with pt 1:1 and provided support and encouragement.  Pt refused PRN Tylenol for pain when it was offered to him.   R: Pt is compliant with medications.  He verbally contracts for safety.  No distress noted.  Will continue to monitor and assess for safety.

## 2014-12-16 NOTE — Plan of Care (Signed)
Problem: Diagnosis: Increased Risk For Suicide Attempt Goal: STG-Patient Will Report Suicidal Feelings to Staff Outcome: Progressing Pt reported passive SI to writer this evening.  He denied having a plan.  Pt verbally contracted for safety.

## 2014-12-16 NOTE — Progress Notes (Addendum)
Pt is pleasant this am but appears depressed. He stated he has constant pain in his neck and back since he had surgery back in 1985. Pt rates his pain a 10/10. MD made aware and will order a lidocaine patch for the pt. Pt is attending the am group and stated his appetite is good. He does contract for safety and denies Si and HI. He also stated every am he has nausea related to the medications he has to take. Pt was given gingerale and MD wil order zofran for the pt. Pt stated sometimes his legs give way. Pt denies any numbness or tingling of his legs and is not losing control or bowel or bladder. Lidocaine patch applied to the lower back and pt was given 4mg  of zofran for nausea.Pt rates his depression a 6/10 and his anxiety a 7/10. He would like to feel better physically and find ways to better himself. 2pm-Pt stated he finds he is getting some relief from the lidocaine patch which was applied to his lower back. He is requesting a prescription for this upon discharge. 4pm-Pt was seen by the hospitalist and instructed to see an opthamolgist upon discharge. 5pm-Pts FSBS was 144 and pt was covered with 2 units of novolgue,

## 2014-12-16 NOTE — Progress Notes (Signed)
[facility] MD Progress Note  12/16/2014 1:09 PM Michael [name]  MRN:  [id number] Subjective: Patient states 'I still feel drowsy , it could be because of all my medications.'  Objective; Patient seen and chart reviewed.Patient discussed with treatment team. Pt presented with worsening depression , after relapsing on cocaine. Pt uses a lot - used 500$ worth recently.Pt with diagnosis of HIV , loneliness is the main trigger.  Pt today with some improvement of his depression , however continues to be very somatic. Patient had MRI done for double vision. Hospitalist to follow . Pt continues to have acid reflux as well as nausea. Patient provided with Zofran prn for the same.  Pt with SI that is passive, which is improving . Patient seen more on the unit , more interactive . Pt is compliant on medications. Denies side effects. Pt is alert, ox4, .    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 HIV disease [B20] 12/14/2014  . Diplopia [H53.2] 12/14/2014  . Cocaine use disorder, severe, dependence [F14.20] 12/13/2014  . Substance or medication-induced depressive disorder with onset during withdrawal [F19.94] 12/13/2014  . 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 DM (diabetes mellitus) [E11.9] 12/13/2014  . Cervicalgia [M54.2] 06/28/2014  . Lumbar radiculopathy, chronic [M54.16] 06/28/2014  . Asthma, chronic [J45.909] 02/03/2014  . 3-vessel CAD [I25.10] 06/24/2013  . Arrhythmia [I49.9] 11/13/2012  . ED (erectile  dysfunction) of organic origin [N52.8] 07/07/2012  . Hypertension goal BP (blood pressure) < 140/80 [I10] 04/29/2012  . Chondromalacia of left knee [M94.262] 03/19/2012  . Hyperlipidemia with target LDL less than 100 [E78.5] 02/12/2012  . Fibromyalgia [M79.7] 02/12/2012  . Uncontrolled type 2 diabetes with neuropathy [E11.40, E11.65] 10/18/2011  . HIV (human immunodeficiency virus infection) [Z21] 08/27/2011   Total Time spent with patient: 30 minutes         Past Medical History:  Past Medical History  Diagnosis Date  . Diabetes mellitus     Diagnosed in 2002, started insulin in 2012  . Hypertension   . Headache(784.0)     CT head 08/2011: Periventricular and subcortical white matter hypodensities are most in keeping with chronic microangiopathic change  . Gout   . Hyperlipidemia   . HIV infection Nov 2012    Followed by [name]  . Crack cocaine use     for 20+ years, has been enrolled in detox programs in the past  . Chondromalacia of medial femoral condyle     Left knee MRI 04/28/12: Chondromalacia of the medial femoral condyle with slight peripheral degeneration of the meniscocapsular junction of the medial meniscus; followed by sports medicine  . Asthma     No PFTs, history of childhood asthma  . Depression     with history of hospitalization for suicidal ideation  . CAD (coronary artery disease)     a. 06/2013 STEMI/PCI ([facility]): LAD w/ thrombus (treated with BMS), mid 75%, D2 75%; LCX OM2 75%; RCA small, PDA 95%, PLV 95%;  b. 10/2013 Cath/PCI: ISR w/in LAD (Promus DES x 2), borderline OM2 lesion;  c. 01/2014 MV: Intermediate risk, medium-sized distal ant wall infarct w/ very small  amt of peri-infarct ischemia. EF 60%.  . Gout 04/28/2012  . Collagen vascular disease   . Cellulitis 04/2014    left facial    Past Surgical History  Procedure Laterality Date  . Prostate surgery    . Bowel resection    . Back surgery      1988  . Cardiac surgery    . Cervical spine  surgery      " rods in my neck "  . Coronary stent placement    . Nm myocar perf wall motion  12/27/2011    normal   Family History:  Family History  Problem Relation Age of Onset  . Diabetes Mother   . Diabetes Father   . Diabetes Brother   . Colon cancer Neg Hx    Social History:  History  Alcohol Use No     History  Drug Use  . Yes  . Special: "Crack" cocaine, Cocaine    Comment: last used crack 04/2014.    History   Social History  . Marital Status: Divorced    Spouse Name: N/A  . Number of Children: N/A  . Years of Education: N/A   Social History Main Topics  . Smoking status: Never Smoker   . Smokeless tobacco: Never Used  . Alcohol Use: No  . Drug Use: Yes    Special: "Crack" cocaine, Cocaine     Comment: last used crack 04/2014.  Marland Kitchen Sexual Activity: No     Comment: accepted condoms   Other Topics Concern  . None   Social History Narrative   Currently staying with a friend in [place].  Was staying @ local motel until a few days ago - left b/c of bed bugs.   Additional History:    Sleep: Fair  Appetite:  Fair    Musculoskeletal: Strength & Muscle Tone: within normal limits Gait & Station: normal Patient leans: N/A   Psychiatric Specialty Exam: Physical Exam  Review of Systems  Psychiatric/Behavioral: Positive for depression, suicidal ideas (passive , improving), hallucinations and substance abuse.    Blood pressure 136/79, pulse 94, temperature 97.9 F (36.6 C), temperature source Oral, resp. rate 18, height '5\' 7"'  (1.702 m), weight 77.111 kg (170 lb), SpO2 100 %.Body mass index is 26.62 kg/(m^2).  General Appearance: Casual  Eye Contact::  Fair  Speech:  Slow  Volume:  Decreased  Mood:  Anxious and Depressed improving  Affect:  Congruent  Thought Process:  Goal Directed  Orientation:  Full (Time, Place, and Person)  Thought Content:  Hallucinations: Auditory and Rumination improving  Suicidal Thoughts:  Yes.  without intent/planfleeting    Homicidal Thoughts:  No  Memory:  Immediate;   Fair Recent;   Fair Remote;   Fair  Judgement:  Fair  Insight:  Fair  Psychomotor Activity:  Normal  Concentration:  Fair  Recall:  [employer] of Knowledge:Fair  Language: Fair  Akathisia:  No  Handed:  Right  AIMS (if indicated):     Assets:  Communication Skills Desire for Improvement  ADL's:  Intact  Cognition: WNL  Sleep:  Number of Hours: 6     Current Medications: Current Facility-Administered Medications  Medication Dose Route Frequency Provider Last Rate Last Dose  . acetaminophen (TYLENOL) tablet 650 mg  650 mg Oral Q6H PRN Delfin Gant, NP   650 mg at 12/15/14 0808  . albuterol (PROVENTIL HFA;VENTOLIN HFA) 108 (90 BASE) MCG/ACT inhaler 2 puff  2 puff Inhalation Q6H PRN Josephine C  Onuoha, NP      . allopurinol (ZYLOPRIM) tablet 200 mg  200 mg Oral Daily Delfin Gant, NP   200 mg at 12/16/14 0808  . aspirin EC tablet 81 mg  81 mg Oral Daily Delfin Gant, NP   81 mg at 12/16/14 0805  . buPROPion [facility] SR) 12 hr tablet 100 mg  100 mg Oral BID Ursula Alert, MD   100 mg at 12/16/14 0808  . clopidogrel (PLAVIX) tablet 75 mg  75 mg Oral Daily Delfin Gant, NP   75 mg at 12/16/14 3545  . diclofenac sodium (VOLTAREN) 1 % transdermal gel 2 g  2 g Topical Daily PRN Delfin Gant, NP      . Emtricitab-Rilpivir-Tenofovir 200-25-300 MG TABS 1 tablet  1 tablet Oral Daily Delfin Gant, NP   1 tablet at 12/16/14 0806  . hydrOXYzine (ATARAX/VISTARIL) tablet 25 mg  25 mg Oral Q6H PRN Kerrie Buffalo, NP      . insulin aspart (novoLOG) injection 0-15 Units  0-15 Units Subcutaneous TID WC Ursula Alert, MD   3 Units at 12/15/14 1707  . insulin aspart (novoLOG) injection 0-5 Units  0-5 Units Subcutaneous QHS Ursula Alert, MD   0 Units at 12/13/14 2200  . insulin glargine (LANTUS) injection 25 Units  25 Units Subcutaneous QHS Ursula Alert, MD   25 Units at 12/15/14 2152  . ipratropium-albuterol  (DUONEB) 0.5-2.5 (3) MG/3ML nebulizer solution 3 mL  3 mL Nebulization Q4H PRN Delfin Gant, NP      . isosorbide mononitrate (IMDUR) 24 hr tablet 30 mg  30 mg Oral Daily Delfin Gant, NP   30 mg at 12/16/14 0805  . lidocaine (LIDODERM) 5 % 2 patch  2 patch Transdermal Q24H Ursula Alert, MD   2 patch at 12/16/14 1045  . lisinopril (PRINIVIL,ZESTRIL) tablet 10 mg  10 mg Oral Daily Delfin Gant, NP   10 mg at 12/16/14 6256  . metFORMIN (GLUCOPHAGE) tablet 1,000 mg  1,000 mg Oral BID WC Delfin Gant, NP   1,000 mg at 12/16/14 0805  . metoprolol succinate (TOPROL-XL) 24 hr tablet 50 mg  50 mg Oral Daily Delfin Gant, NP   50 mg at 12/16/14 0809  . ondansetron (ZOFRAN-ODT) disintegrating tablet 4 mg  4 mg Oral Q8H PRN Ursula Alert, MD   4 mg at 12/16/14 1046  . pravastatin (PRAVACHOL) tablet 40 mg  40 mg Oral q1800 Delfin Gant, NP   40 mg at 12/15/14 1705  . QUEtiapine (SEROQUEL) tablet 50 mg  50 mg Oral QHS Ursula Alert, MD   50 mg at 12/15/14 2149    Lab Results:  Results for orders placed or performed during the hospital encounter of 12/12/14 (from the past 48 hour(s))  Glucose, capillary     Status: Abnormal   Collection Time: 12/14/14  5:11 PM  Result Value Ref Range   Glucose-Capillary 149 (H) 70 - 99 mg/dL   Comment 1 Document in Chart    Comment 2 Repeat Test   Urinalysis, Routine w reflex microscopic     Status: Abnormal   Collection Time: 12/14/14  8:18 PM  Result Value Ref Range   Color, Urine YELLOW YELLOW   APPearance CLEAR CLEAR   Specific Gravity, Urine 1.020 1.005 - 1.030   pH 5.0 5.0 - 8.0   Glucose, UA 100 (A) NEGATIVE mg/dL   Hgb urine dipstick NEGATIVE NEGATIVE   Bilirubin Urine NEGATIVE NEGATIVE  Ketones, ur NEGATIVE NEGATIVE mg/dL   Protein, ur NEGATIVE NEGATIVE mg/dL   Urobilinogen, UA 0.2 0.0 - 1.0 mg/dL   Nitrite NEGATIVE NEGATIVE   Leukocytes, UA NEGATIVE NEGATIVE    Comment: MICROSCOPIC NOT DONE ON URINES WITH  NEGATIVE PROTEIN, BLOOD, LEUKOCYTES, NITRITE, OR GLUCOSE <1000 mg/dL. Performed at [facility]   Glucose, capillary     Status: Abnormal   Collection Time: 12/14/14  8:49 PM  Result Value Ref Range   Glucose-Capillary 145 (H) 70 - 99 mg/dL  Glucose, capillary     Status: None   Collection Time: 12/15/14  6:05 AM  Result Value Ref Range   Glucose-Capillary 75 70 - 99 mg/dL  TSH     Status: None   Collection Time: 12/15/14  6:50 AM  Result Value Ref Range   TSH 1.821 0.350 - 4.500 uIU/mL    Comment: Performed at [place] metabolic panel     Status: Abnormal   Collection Time: 12/15/14  6:50 AM  Result Value Ref Range   Sodium 141 135 - 145 mmol/L   Potassium 4.0 3.5 - 5.1 mmol/L   Chloride 112 96 - 112 mmol/L   CO2 22 19 - 32 mmol/L   Glucose, Bld 103 (H) 70 - 99 mg/dL   BUN 21 6 - 23 mg/dL   Creatinine, Ser 1.28 0.50 - 1.35 mg/dL   Calcium 8.9 8.4 - 10.5 mg/dL   GFR calc non Af Amer 58 (L) >90 mL/min   GFR calc Af Amer 67 (L) >90 mL/min    Comment: (NOTE) The eGFR has been calculated using the CKD EPI equation. This calculation has not been validated in all clinical situations. eGFR's persistently <90 mL/min signify possible Chronic Kidney Disease.    Anion gap 7 5 - 15    Comment: Performed at [facility]  CK     Status: None   Collection Time: 12/15/14  6:50 AM  Result Value Ref Range   Total CK 150 7 - 232 U/L    Comment: Performed at [facility]  Hemoglobin A1c     Status: Abnormal   Collection Time: 12/15/14  6:50 AM  Result Value Ref Range   Hgb A1c MFr Bld 8.2 (H) 4.8 - 5.6 %    Comment: (NOTE)         Pre-diabetes: 5.7 - 6.4         Diabetes: >6.4         Glycemic control for adults with diabetes: <7.0    Mean Plasma Glucose 189 mg/dL    Comment: (NOTE) Performed At: [facility] [address] [place], [place] [id number] Lindon Romp MD  [id number] Performed at [facility]   Glucose, capillary     Status: None   Collection Time: 12/15/14 11:44 AM  Result Value Ref Range   Glucose-Capillary 89 70 - 99 mg/dL  Glucose, capillary     Status: Abnormal   Collection Time: 12/15/14  4:57 PM  Result Value Ref Range   Glucose-Capillary 165 (H) 70 - 99 mg/dL  Glucose, capillary     Status: Abnormal   Collection Time: 12/15/14  9:35 PM  Result Value Ref Range   Glucose-Capillary 113 (H) 70 - 99 mg/dL   Comment 1 Notify RN    Comment 2 Document in Chart   Glucose, capillary     Status: None   Collection Time: 12/16/14  6:12 AM  Result Value Ref Range   Glucose-Capillary 85 70 - 99 mg/dL    Physical Findings: AIMS: Facial and Oral Movements Muscles of Facial Expression: None, normal Lips and Perioral Area: None, normal Jaw: None, normal Tongue: None, normal,Extremity Movements Upper (arms, wrists, hands, fingers): None, normal Lower (legs, knees, ankles, toes): None, normal, Trunk Movements Neck, shoulders, hips: None, normal, Overall Severity Severity of abnormal movements (highest score from questions above): None, normal Incapacitation due to abnormal movements: None, normal Patient's awareness of abnormal movements (rate only patient's report): No Awareness, Dental Status Current problems with teeth and/or dentures?: No Does patient usually wear dentures?: No  CIWA:    COWS:  COWS Total Score: 0  Assessment: Pt is a 71 y old AAM with hx of HIV,DM,HTN,as well as polysubstance abuse presented with depression and SI after relapsing on cocaine. Pt today with some improvement of his depression , but with several somatic complaints.     Treatment Plan Summary: Daily contact with patient to assess and evaluate symptoms and progress in treatment and Medication management Will continue Wellbutrin to SR 100 mg po BID  for affective symptoms. Will continue Seroquel 50 mg po qhs for psychosis as well  as sleep. Will continue to monitor vitals ,medication compliance and treatment side effects while patient is here.  Consulted hospitalist see consult note - pt had MRI scan brain done - Hospitalist to follow. Patient with uncontrolled diabetes- will follow diabetic coordinator recommendations.  CSW will start working on disposition.  Patient to participate in therapeutic milieu .    Medical Decision Making:  Review of Psycho-Social Stressors (1), Review or order clinical lab tests (1), Review of Last Therapy Session (1), Review or order medicine tests (1), Review of Medication Regimen & Side Effects (2) and Review of New Medication or Change in Dosage (2)     Chauntay Paszkiewicz  MD 12/16/2014, 1:09 PM

## 2014-12-16 NOTE — Progress Notes (Signed)
D. Pt escorted for MRI of the brain this evening. Upon returning, pt spoke of his day and about how he was feeling better but still endorsing some depression and does appear depressed while on the unit. Pt also spoke about how he has been having difficulty with sleep and was hoping for a good night's rest tonight. Pt did receive all medications without incident. A. Support and encouragement provided. R. Safety maintained, will continue to monitor.

## 2014-12-16 NOTE — BHH Group Notes (Signed)
[facility] LCSW Aftercare Discharge Planning Group Note   12/16/2014 11:30 AM  Participation Quality:  Engaged  Mood/Affect:  Depressed  Depression Rating:  6  Anxiety Rating:  7  Thoughts of Suicide:  No Will you contract for safety?   NA  Current AVH:  Yes  Plan for Discharge/Comments:  Continues to be somatically focused.  "I had to walk all the way from the ED through the whole hospital to get to the MRI room.  I am still recovering from that."  Also complained about side effects from medication.  Transportation Means: family  Supports: family  [place], Ernestine Mcmurray

## 2014-12-16 NOTE — BHH Group Notes (Signed)
[place] LCSW Group Therapy  12/16/2014  1:05 PM  Type of Therapy:  Group therapy  Participation Level:  Active  Participation Quality:  Attentive  Affect:  Flat  Cognitive:  Oriented  Insight:  Limited  Engagement in Therapy:  Limited  Modes of Intervention:  Discussion, Socialization  Summary of Progress/Problems:  Chaplain was here to lead a group on themes of hope and courage.  Tried to participate, but was having a difficult time.  Admitted he is worried about his MRI results and is unable to focus.  Was able to contribute that his family gives him hope as he can always depend on them for care.  "They don't turn their backs on me."  Michael [name] 12/16/2014 4:00 PM

## 2014-12-17 DIAGNOSIS — F14959 Cocaine use, unspecified with cocaine-induced psychotic disorder, unspecified: Secondary | ICD-10-CM

## 2014-12-17 LAB — GLUCOSE, CAPILLARY
GLUCOSE-CAPILLARY: 136 mg/dL — AB (ref 70–99)
Glucose-Capillary: 99 mg/dL (ref 70–99)
Glucose-Capillary: 144 mg/dL — ABNORMAL HIGH (ref 70–99)
Glucose-Capillary: 98 mg/dL (ref 70–99)
Glucose-Capillary: 107 mg/dL — ABNORMAL HIGH (ref 70–99)
Glucose-Capillary: 133 mg/dL — ABNORMAL HIGH (ref 70–99)

## 2014-12-17 MED ORDER — GABAPENTIN 100 MG PO CAPS
100.0000 mg | ORAL_CAPSULE | Freq: Three times a day (TID) | ORAL | Status: DC
  Administered 2014-12-17 – 2014-12-20 (×10): 100 mg via ORAL
  Filled 2014-12-17 (×2): qty 1
  Filled 2014-12-17 (×2): qty 42
  Filled 2014-12-17 (×7): qty 1
  Filled 2014-12-17: qty 42
  Filled 2014-12-17 (×5): qty 1

## 2014-12-17 MED ORDER — GABAPENTIN 100 MG PO CAPS
ORAL_CAPSULE | ORAL | Status: AC
  Administered 2014-12-17: 100 mg via ORAL
  Filled 2014-12-17: qty 1

## 2014-12-17 MED ORDER — LISINOPRIL 5 MG PO TABS
5.0000 mg | ORAL_TABLET | Freq: Every day | ORAL | Status: DC
  Administered 2014-12-18 – 2014-12-20 (×3): 5 mg via ORAL
  Filled 2014-12-17 (×5): qty 1

## 2014-12-17 NOTE — Progress Notes (Addendum)
Pt appears very depressed this am. He states he is very worried about his health . Pt states he continues to have blurred vision when looking at things up close. He also is concerned about his MRI results. He would like to speak to the hospitalist again about the results she gave him. Pt also is c/o right groin pain stating years ago he had a testicle removed and now has pain at the incisional site. NP made aware . Pt is flat this am. He did attend group and stated he likes the fact he is respectful towards others. Two of pts BP meds held this am due to pt feeling dizzy and BP 114/73 and HR 83. Pt denies any chest pain. He was encouraged to drink po and to call the nurse if he felt unsteady on his feet. Pt does contract for safety and denies SI and HI. 2pm-Pt stated he is not feeling dizzy or lightheaded anymore. His FSBS at 12noon was 107 and he required no coverage. Pt is looking forward to watching the [place] game this pm. 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 He stated a metal pipe fell on him and as a result he had to have back and neck surgery,. Pt explored options on how to occupy himself in his free time. He stated,"I feel like a prisoner in my own home." Pt was offered to accompany his sister on a cruise . He stated maybe he would think about that. He did inquire about bus tours and stated he has a daughter and son who he is close to.pt and his wife divorced after 64 years of marriage. He stated he always worked hard to provide for the family .6:30pm- Pt was given volteran cream to apply to his knees.

## 2014-12-17 NOTE — BHH Group Notes (Signed)
[place] Group Notes:  (Clinical Social Work)  12/17/2014  11:15-12:00PM  Summary of Progress/Problems:   The main focus of today's process group was to discuss patients' feelings about hospitalization, the stigma attached to mental health, and sources of motivation to stay well.  We then worked to identify a specific plan to avoid future hospitalizations when discharged from the hospital for this admission.  The patient expressed that he is going to live in an apartment alone when he leaves the hospital.  He still feels they are a lot of answers he needs to have before he will be ready to leave the hospital. He did not want to discuss specifics in the group setting.  Type of Therapy:  Group Therapy - Process  Participation Level:  Minimal  Participation Quality:  Attentive  Affect:  Blunted and Depressed  Cognitive:  Alert  Insight:  Improving  Engagement in Therapy:  Improving  Modes of Intervention:  Exploration, Discussion  Selmer Dominion, LCSW 12/17/2014, 1:02 PM

## 2014-12-17 NOTE — Progress Notes (Signed)
Did not attend group 

## 2014-12-17 NOTE — Progress Notes (Signed)
D: Pt has anxious affect and mood.  Pt reports his goal today was "just working on myself."  Pt reports he "went outside, watched other people out there."  Pt denies SI/HI, denies hallucinations.  Pt did not attend evening group.  He was visible in the milieu after group and interacted with staff and peers appropriately.  A: Medications administered per order.  Safety maintained.  Encouraged and supported pt.  PRN medication administered for anxiety and pain, see flowsheet.   R: Pt verbally contracts for safety.  He is currently resting in his room.  No distress noted.  Will continue to monitor and assess.

## 2014-12-17 NOTE — BHH Group Notes (Signed)
[place] Group Notes:  Coping skills  Date:  12/17/2014  Time:  10:56 AM  Type of Therapy:  Nurse Education  Participation Level:  Active  Participation Quality:  Appropriate  Affect:  Appropriate  Cognitive:  Appropriate  Insight:  Appropriate  Engagement in Group:  Engaged  Modes of Intervention:  Discussion  Summary of Progress/Problems:  Delman Kitten 12/17/2014, 10:56 AM

## 2014-12-17 NOTE — Progress Notes (Signed)
Patient ID: Michael [name], male   DOB: 1951/06/07, 64 y.o.   MRN: [id number] [facility] MD Progress Note  12/17/2014 5:46 PM [name]  MRN:  [id number] Subjective: Patient states "I am still depressed. I would rate it at an eight. I worry about my medical problems. I started using crack to deal with chronic pain. Everything is just hard to deal with."   Objective; Patient seen and chart reviewed. Pt presented with worsening depression, after relapsing on cocaine. Pt uses a lot - used 500$ worth recently.Pt with diagnosis of HIV , loneliness is the main trigger. Pt today with some improvement of his depression, however continues to be very somatic.  Patient had MRI done for double vision. The patient was very worried today about the results although they were reviewed with him by MD. This writer read patient the MRI report. He was also concerned about being on three "blood pressure medications." Spent time reviewing that these were from prior to admission and each had a specific indication. The patient reported feeling better after the discussion. Hospitalist to follow.Pt with SI that is passive, which is improving . Patient seen more on the unit, more interactive. Pt is compliant on medications. Denies side effects.  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 HIV disease [B20] 12/14/2014  . Diplopia [H53.2] 12/14/2014  . Cocaine use disorder, severe, dependence [F14.20] 12/13/2014  . Substance or medication-induced depressive disorder with onset during withdrawal [F19.94] 12/13/2014   . 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 DM (diabetes mellitus) [E11.9] 12/13/2014  . Cervicalgia [M54.2] 06/28/2014  . Lumbar radiculopathy, chronic [M54.16] 06/28/2014  . Asthma, chronic [J45.909] 02/03/2014  . 3-vessel CAD [I25.10] 06/24/2013  . Arrhythmia [I49.9] 11/13/2012  . ED (erectile dysfunction) of organic origin [N52.8] 07/07/2012  . Hypertension goal BP (blood pressure) < 140/80 [I10] 04/29/2012  . Chondromalacia of left knee [M94.262] 03/19/2012  . Hyperlipidemia with target LDL less than 100 [E78.5] 02/12/2012  . Fibromyalgia [M79.7] 02/12/2012  . Uncontrolled type 2 diabetes with neuropathy [E11.40, E11.65] 10/18/2011  . HIV (human immunodeficiency virus infection) [Z21] 08/27/2011   Total Time spent with patient: 30 minutes  Past Medical History:  Past Medical History  Diagnosis Date  . Diabetes mellitus     Diagnosed in 2002, started insulin in 2012  . Hypertension   . Headache(784.0)     CT head 08/2011: Periventricular and subcortical white matter hypodensities are most in keeping with chronic microangiopathic change  . Gout   . Hyperlipidemia   . HIV infection Nov 2012    Followed by [name]  . Crack cocaine use     for 20+ years, has been enrolled in detox programs in the past  . Chondromalacia of medial femoral condyle     Left knee MRI 04/28/12: Chondromalacia of the medial femoral condyle with slight peripheral degeneration of the meniscocapsular junction of the medial meniscus; followed by sports medicine  . Asthma     No PFTs, history of childhood asthma  . Depression     with history of hospitalization for suicidal ideation  . CAD (coronary artery disease)     a.  06/2013 STEMI/PCI ([facility]): LAD w/ thrombus (treated with BMS), mid 75%, D2 75%; LCX OM2 75%; RCA small, PDA 95%, PLV 95%;  b. 10/2013 Cath/PCI: ISR w/in LAD (Promus DES x 2), borderline OM2 lesion;  c. 01/2014 MV:  Intermediate risk, medium-sized distal ant wall infarct w/ very small amt of peri-infarct ischemia. EF 60%.  . Gout 04/28/2012  . Collagen vascular disease   . Cellulitis 04/2014    left facial    Past Surgical History  Procedure Laterality Date  . Prostate surgery    . Bowel resection    . Back surgery      1988  . Cardiac surgery    . Cervical spine surgery      " rods in my neck "  . Coronary stent placement    . Nm myocar perf wall motion  12/27/2011    normal   Family History:  Family History  Problem Relation Age of Onset  . Diabetes Mother   . Diabetes Father   . Diabetes Brother   . Colon cancer Neg Hx    Social History:  History  Alcohol Use No     History  Drug Use  . Yes  . Special: "Crack" cocaine, Cocaine    Comment: last used crack 04/2014.    History   Social History  . Marital Status: Divorced    Spouse Name: N/A  . Number of Children: N/A  . Years of Education: N/A   Social History Main Topics  . Smoking status: Never Smoker   . Smokeless tobacco: Never Used  . Alcohol Use: No  . Drug Use: Yes    Special: "Crack" cocaine, Cocaine     Comment: last used crack 04/2014.  Marland Kitchen Sexual Activity: No     Comment: accepted condoms   Other Topics Concern  . None   Social History Narrative   Currently staying with a friend in [place].  Was staying @ local motel until a few days ago - left b/c of bed bugs.   Additional History:    Sleep: Fair  Appetite:  Fair  Musculoskeletal: Strength & Muscle Tone: within normal limits Gait & Station: normal Patient leans: N/A  Psychiatric Specialty Exam: Physical Exam  Review of Systems  Constitutional: Negative.   Eyes: Positive for blurred vision.  Respiratory: Negative.   Cardiovascular: Negative.   Gastrointestinal: Negative.   Genitourinary: Negative.   Musculoskeletal: Positive for back pain and joint pain.  Skin: Negative.   Neurological: Positive for dizziness.  Endo/Heme/Allergies: Negative.    Psychiatric/Behavioral: Positive for depression, suicidal ideas and substance abuse. Negative for hallucinations and memory loss. The patient is nervous/anxious. The patient does not have insomnia.     Blood pressure 114/76, pulse 83, temperature 98.4 F (36.9 C), temperature source Oral, resp. rate 16, height 5\' 7"  (1.702 m), weight 77.111 kg (170 lb), SpO2 100 %.Body mass index is 26.62 kg/(m^2).  General Appearance: Casual  Eye Contact::  Good  Speech:  Normal Rate  Volume:  Normal  Mood:  Anxious and Depressed improving  Affect:  Congruent  Thought Process:  Goal Directed  Orientation:  Full (Time, Place, and Person)  Thought Content:  Hallucinations: Auditory and Rumination improving  Suicidal Thoughts:  Yes.  without intent/planfleeting   Homicidal Thoughts:  No  Memory:  Immediate;   Fair Recent;   Fair Remote;   Fair  Judgement:  Fair  Insight:  Fair  Psychomotor Activity:  Normal  Concentration:  Fair  Recall:  [place]  Language: Fair  Akathisia:  No  Handed:  Right  AIMS (if indicated):     Assets:  Communication Skills Desire for Improvement  ADL's:  Intact  Cognition: WNL  Sleep:  Number of Hours: 6     Current Medications: Current Facility-Administered Medications  Medication Dose Route Frequency Provider Last Rate Last Dose  . acetaminophen (TYLENOL) tablet 650 mg  650 mg Oral Q6H PRN Delfin Gant, NP   650 mg at 12/15/14 0808  . albuterol (PROVENTIL HFA;VENTOLIN HFA) 108 (90 BASE) MCG/ACT inhaler 2 puff  2 puff Inhalation Q6H PRN Delfin Gant, NP      . allopurinol (ZYLOPRIM) tablet 200 mg  200 mg Oral Daily Delfin Gant, NP   200 mg at 12/17/14 0758  . aspirin EC tablet 81 mg  81 mg Oral Daily Delfin Gant, NP   81 mg at 12/17/14 0757  . buPROPion (WELLBUTRIN SR) 12 hr tablet 100 mg  100 mg Oral BID Ursula Alert, MD   100 mg at 12/17/14 1721  . clopidogrel (PLAVIX) tablet 75 mg  75 mg Oral Daily Delfin Gant, NP   75 mg at 12/17/14 0759  . diclofenac sodium (VOLTAREN) 1 % transdermal gel 2 g  2 g Topical Daily PRN Delfin Gant, NP      . Emtricitab-Rilpivir-Tenofovir 200-25-300 MG TABS 1 tablet  1 tablet Oral Daily Delfin Gant, NP   1 tablet at 12/17/14 0759  . hydrOXYzine (ATARAX/VISTARIL) tablet 25 mg  25 mg Oral Q6H PRN Kerrie Buffalo, NP   25 mg at 12/16/14 2115  . insulin aspart (novoLOG) injection 0-15 Units  0-15 Units Subcutaneous TID WC Ursula Alert, MD   2 Units at 12/17/14 1722  . insulin aspart (novoLOG) injection 0-5 Units  0-5 Units Subcutaneous QHS Ursula Alert, MD   0 Units at 12/13/14 2200  . insulin glargine (LANTUS) injection 25 Units  25 Units Subcutaneous QHS Ursula Alert, MD   25 Units at 12/16/14 2115  . ipratropium-albuterol (DUONEB) 0.5-2.5 (3) MG/3ML nebulizer solution 3 mL  3 mL Nebulization Q4H PRN Delfin Gant, NP      . isosorbide mononitrate (IMDUR) 24 hr tablet 30 mg  30 mg Oral Daily Delfin Gant, NP   30 mg at 12/17/14 0757  . lidocaine (LIDODERM) 5 % 2 patch  2 patch Transdermal Q24H Ursula Alert, MD   2 patch at 12/17/14 0916  . [START ON 12/18/2014] lisinopril (PRINIVIL,ZESTRIL) tablet 5 mg  5 mg Oral Daily Niel Hummer, NP      . metFORMIN (GLUCOPHAGE) tablet 1,000 mg  1,000 mg Oral BID WC Delfin Gant, NP   1,000 mg at 12/17/14 0758  . metoprolol succinate (TOPROL-XL) 24 hr tablet 50 mg  50 mg Oral Daily Delfin Gant, NP   50 mg at 12/16/14 0809  . ondansetron (ZOFRAN-ODT) disintegrating tablet 4 mg  4 mg Oral Q8H PRN Ursula Alert, MD   4 mg at 12/17/14 0807  . pravastatin (PRAVACHOL) tablet 40 mg  40 mg Oral q1800 Delfin Gant, NP   40 mg at 12/16/14 1710  . QUEtiapine (SEROQUEL) tablet 50 mg  50 mg Oral QHS Ursula Alert, MD   50 mg at 12/16/14 2115    Lab Results:  Results for orders placed or performed during the hospital encounter of 12/12/14 (from the past 48 hour(s))  Glucose, capillary      Status:  Abnormal   Collection Time: 12/15/14  9:35 PM  Result Value Ref Range   Glucose-Capillary 113 (H) 70 - 99 mg/dL   Comment 1 Notify RN    Comment 2 Document in Chart   Glucose, capillary     Status: None   Collection Time: 12/16/14  6:12 AM  Result Value Ref Range   Glucose-Capillary 85 70 - 99 mg/dL  Glucose, capillary     Status: Abnormal   Collection Time: 12/16/14  8:51 PM  Result Value Ref Range   Glucose-Capillary 133 (H) 70 - 99 mg/dL  Glucose, capillary     Status: None   Collection Time: 12/17/14  6:16 AM  Result Value Ref Range   Glucose-Capillary 98 70 - 99 mg/dL  Glucose, capillary     Status: Abnormal   Collection Time: 12/17/14 12:17 PM  Result Value Ref Range   Glucose-Capillary 107 (H) 70 - 99 mg/dL   Comment 1 Notify RN   Glucose, capillary     Status: Abnormal   Collection Time: 12/17/14  4:48 PM  Result Value Ref Range   Glucose-Capillary 136 (H) 70 - 99 mg/dL    Physical Findings: AIMS: Facial and Oral Movements Muscles of Facial Expression: None, normal Lips and Perioral Area: None, normal Jaw: None, normal Tongue: None, normal,Extremity Movements Upper (arms, wrists, hands, fingers): None, normal Lower (legs, knees, ankles, toes): None, normal, Trunk Movements Neck, shoulders, hips: None, normal, Overall Severity Severity of abnormal movements (highest score from questions above): None, normal Incapacitation due to abnormal movements: None, normal Patient's awareness of abnormal movements (rate only patient's report): No Awareness, Dental Status Current problems with teeth and/or dentures?: No Does patient usually wear dentures?: No  CIWA:    COWS:  COWS Total Score: 0  Assessment: Pt is a 31 y old AAM with hx of HIV,DM,HTN,as well as polysubstance abuse presented with depression and SI after relapsing on cocaine. Pt today with some improvement of his depression , but with several somatic complaints.  Treatment Plan Summary: Daily contact  with patient to assess and evaluate symptoms and progress in treatment and Medication management Will continue Wellbutrin to SR 100 mg po BID  for affective symptoms. Will start Neurontin 100 mg TID for anxiety as well as diabetic neuropathy.  Decrease Lisinopril to 5 mg daily due to dizziness.  Will continue Seroquel 50 mg po qhs for psychosis as well as sleep. Will continue to monitor vitals ,medication compliance and treatment side effects while patient is here.  Consulted hospitalist see consult note - pt had MRI scan brain done - Hospitalist has signed off after evaluation.  Patient with uncontrolled diabetes- will follow diabetic coordinator recommendations.  CSW will start working on disposition.  Patient to participate in therapeutic milieu .   Medical Decision Making:  Review of Psycho-Social Stressors (1), Review or order clinical lab tests (1), Review of Last Therapy Session (1), Review or order medicine tests (1), Review of Medication Regimen & Side Effects (2) and Review of New Medication or Change in Dosage (2)  DAVIS, LAURA  NP-C 12/17/2014, 5:46 PM   I have been consulted about this patient and agree with the assessment and plan Geralyn Flash A. Chamberino.D.

## 2014-12-17 NOTE — Plan of Care (Signed)
Problem: Alteration in mood & ability to function due to Goal: STG-Patient will attend groups Outcome: Not Progressing Pt did not attend evening group on 12/17/14.

## 2014-12-17 NOTE — Progress Notes (Signed)
Pt's CBG was 98 mg/dL at 0616 this morning.  No insulin coverage required per order.

## 2014-12-18 DIAGNOSIS — E118 Type 2 diabetes mellitus with unspecified complications: Secondary | ICD-10-CM | POA: Insufficient documentation

## 2014-12-18 LAB — GLUCOSE, CAPILLARY
GLUCOSE-CAPILLARY: 95 mg/dL (ref 70–99)
Glucose-Capillary: 107 mg/dL — ABNORMAL HIGH (ref 70–99)
Glucose-Capillary: 188 mg/dL — ABNORMAL HIGH (ref 70–99)

## 2014-12-18 NOTE — Progress Notes (Signed)
Patient ID: Michael [name], male   DOB: 04-24-1951, 64 y.o.   MRN:  [facility] MD Progress Note  12/18/2014 5:31 PM LEDGER SILERIO  MRN:  [id number] Subjective: Patient states "I am a little less depressed today. I am looking forward to going back home where it is more quiet. I am still feeling dizzy but that has been going on a long time. I have not fallen and do not feel dizzy when I stand up. I have an appointment with my Primary Care on 3/16 to discuss my blood pressure medications."   Objective; Patient seen and chart reviewed. Pt presented with worsening depression, after relapsing on cocaine. Pt uses a lot - used 500$ worth recently.Pt with diagnosis of HIV , loneliness is the main trigger. Pt today with some improvement of his depression, however continues to be very somatic. His affect appears to be brighter today during interactions. Pt is denying suicidal ideation today. Patient seen more on the unit, more interactive. Pt is compliant on medications. Denies side effects.  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 HIV disease [B20] 12/14/2014  . Diplopia [H53.2] 12/14/2014  . Cocaine use disorder, severe, dependence [F14.20] 12/13/2014  . Substance or medication-induced depressive disorder with onset during withdrawal [F19.94] 12/13/2014  . 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 DM (diabetes mellitus) [E11.9] 12/13/2014  . Cervicalgia [M54.2] 06/28/2014  . Lumbar radiculopathy, chronic [M54.16] 06/28/2014   . Asthma, chronic [J45.909] 02/03/2014  . 3-vessel CAD [I25.10] 06/24/2013  . Arrhythmia [I49.9] 11/13/2012  . ED (erectile dysfunction) of organic origin [N52.8] 07/07/2012  . Hypertension goal BP (blood pressure) < 140/80 [I10] 04/29/2012  . Chondromalacia of left knee [M94.262] 03/19/2012  . Hyperlipidemia with target LDL less than 100 [E78.5] 02/12/2012  . Fibromyalgia [M79.7] 02/12/2012  . Uncontrolled type 2 diabetes with neuropathy [E11.40, E11.65] 10/18/2011  . HIV (human immunodeficiency virus infection) [Z21] 08/27/2011   Total Time spent with patient: 20 minutes  Past Medical History:  Past Medical History  Diagnosis Date  . Diabetes mellitus     Diagnosed in 2002, started insulin in 2012  . Hypertension   . Headache(784.0)     CT head 08/2011: Periventricular and subcortical white matter hypodensities are most in keeping with chronic microangiopathic change  . Gout   . Hyperlipidemia   . HIV infection Nov 2012    Followed by [name]  . Crack cocaine use     for 20+ years, has been enrolled in detox programs in the past  . Chondromalacia of medial femoral condyle     Left knee MRI 04/28/12: Chondromalacia of the medial femoral condyle with slight peripheral degeneration of the meniscocapsular junction of the medial meniscus; followed by sports medicine  . Asthma     No PFTs, history of childhood asthma  . Depression     with history of hospitalization for suicidal ideation  . CAD (coronary artery disease)     a. 06/2013 STEMI/PCI ([facility]): LAD w/ thrombus (treated with BMS), mid 75%, D2 75%; LCX OM2 75%; RCA small, PDA 95%, PLV 95%;  b. 10/2013 Cath/PCI: ISR w/in LAD (Promus DES x 2), borderline  OM2 lesion;  c. 01/2014 MV: Intermediate risk, medium-sized distal ant wall infarct w/ very small amt of peri-infarct ischemia. EF 60%.  . Gout 04/28/2012  . Collagen vascular disease   . Cellulitis 04/2014    left facial    Past Surgical History  Procedure Laterality Date  .  Prostate surgery    . Bowel resection    . Back surgery      1988  . Cardiac surgery    . Cervical spine surgery      " rods in my neck "  . Coronary stent placement    . Nm myocar perf wall motion  12/27/2011    normal   Family History:  Family History  Problem Relation Age of Onset  . Diabetes Mother   . Diabetes Father   . Diabetes Brother   . Colon cancer Neg Hx    Social History:  History  Alcohol Use No     History  Drug Use  . Yes  . Special: "Crack" cocaine, Cocaine    Comment: last used crack 04/2014.    History   Social History  . Marital Status: Divorced    Spouse Name: N/A  . Number of Children: N/A  . Years of Education: N/A   Social History Main Topics  . Smoking status: Never Smoker   . Smokeless tobacco: Never Used  . Alcohol Use: No  . Drug Use: Yes    Special: "Crack" cocaine, Cocaine     Comment: last used crack 04/2014.  Marland Kitchen Sexual Activity: No     Comment: accepted condoms   Other Topics Concern  . None   Social History Narrative   Currently staying with a friend in [place].  Was staying @ local motel until a few days ago - left b/c of bed bugs.   Additional History:    Sleep: Fair  Appetite:  Fair  Musculoskeletal: Strength & Muscle Tone: within normal limits Gait & Station: normal Patient leans: N/A  Psychiatric Specialty Exam: Physical Exam  ROS  Blood pressure 132/70, pulse 72, temperature 97.8 F (36.6 C), temperature source Oral, resp. rate 16, height 5\' 7"  (1.702 m), weight 77.111 kg (170 lb), SpO2 100 %.Body mass index is 26.62 kg/(m^2).  General Appearance: Casual  Eye Contact::  Good  Speech:  Normal Rate  Volume:  Normal  Mood:  Anxious and Depressed improving  Affect:  Congruent  Thought Process:  Goal Directed  Orientation:  Full (Time, Place, and Person)  Thought Content:  Hallucinations: Auditory and Rumination improving  Suicidal Thoughts:  Yes.  without intent/planfleeting   Homicidal Thoughts:  No  Memory:   Immediate;   Fair Recent;   Fair Remote;   Fair  Judgement:  Fair  Insight:  Fair  Psychomotor Activity:  Normal  Concentration:  Fair  Recall:  [employer] of Knowledge:Fair  Language: Fair  Akathisia:  No  Handed:  Right  AIMS (if indicated):     Assets:  Communication Skills Desire for Improvement  ADL's:  Intact  Cognition: WNL  Sleep:  Number of Hours: 4.75     Current Medications: Current Facility-Administered Medications  Medication Dose Route Frequency Provider Last Rate Last Dose  . acetaminophen (TYLENOL) tablet 650 mg  650 mg Oral Q6H PRN Delfin Gant, NP   650 mg at 12/17/14 2125  . albuterol (PROVENTIL HFA;VENTOLIN HFA) 108 (90 BASE) MCG/ACT inhaler 2 puff  2 puff Inhalation Q6H PRN Delfin Gant, NP      .  allopurinol (ZYLOPRIM) tablet 200 mg  200 mg Oral Daily Delfin Gant, NP   200 mg at 12/18/14 0748  . aspirin EC tablet 81 mg  81 mg Oral Daily Delfin Gant, NP   81 mg at 12/18/14 0749  . buPROPion (WELLBUTRIN SR) 12 hr tablet 100 mg  100 mg Oral BID Ursula Alert, MD   100 mg at 12/18/14 1717  . clopidogrel (PLAVIX) tablet 75 mg  75 mg Oral Daily Delfin Gant, NP   75 mg at 12/18/14 0748  . diclofenac sodium (VOLTAREN) 1 % transdermal gel 2 g  2 g Topical Daily PRN Delfin Gant, NP   2 g at 12/17/14 1826  . Emtricitab-Rilpivir-Tenofovir 200-25-300 MG TABS 1 tablet  1 tablet Oral Daily Delfin Gant, NP   1 tablet at 12/18/14 0748  . gabapentin (NEURONTIN) capsule 100 mg  100 mg Oral TID Niel Hummer, NP   100 mg at 12/18/14 1716  . hydrOXYzine (ATARAX/VISTARIL) tablet 25 mg  25 mg Oral Q6H PRN Kerrie Buffalo, NP   25 mg at 12/17/14 2125  . insulin aspart (novoLOG) injection 0-15 Units  0-15 Units Subcutaneous TID WC Ursula Alert, MD   3 Units at 12/18/14 1700  . insulin aspart (novoLOG) injection 0-5 Units  0-5 Units Subcutaneous QHS Ursula Alert, MD   0 Units at 12/13/14 2200  . insulin glargine (LANTUS) injection 25  Units  25 Units Subcutaneous QHS Ursula Alert, MD   25 Units at 12/17/14 2125  . ipratropium-albuterol (DUONEB) 0.5-2.5 (3) MG/3ML nebulizer solution 3 mL  3 mL Nebulization Q4H PRN Delfin Gant, NP      . isosorbide mononitrate (IMDUR) 24 hr tablet 30 mg  30 mg Oral Daily Delfin Gant, NP   30 mg at 12/18/14 0748  . lidocaine (LIDODERM) 5 % 2 patch  2 patch Transdermal Q24H Ursula Alert, MD   2 patch at 12/18/14 0750  . lisinopril (PRINIVIL,ZESTRIL) tablet 5 mg  5 mg Oral Daily Niel Hummer, NP   5 mg at 12/18/14 0748  . metFORMIN (GLUCOPHAGE) tablet 1,000 mg  1,000 mg Oral BID WC Delfin Gant, NP   1,000 mg at 12/18/14 1716  . metoprolol succinate (TOPROL-XL) 24 hr tablet 50 mg  50 mg Oral Daily Delfin Gant, NP   50 mg at 12/18/14 0748  . ondansetron (ZOFRAN-ODT) disintegrating tablet 4 mg  4 mg Oral Q8H PRN Ursula Alert, MD   4 mg at 12/17/14 0807  . pravastatin (PRAVACHOL) tablet 40 mg  40 mg Oral q1800 Delfin Gant, NP   40 mg at 12/18/14 1716  . QUEtiapine (SEROQUEL) tablet 50 mg  50 mg Oral QHS Ursula Alert, MD   50 mg at 12/17/14 2125    Lab Results:  Results for orders placed or performed during the hospital encounter of 12/12/14 (from the past 48 hour(s))  Glucose, capillary     Status: Abnormal   Collection Time: 12/16/14  8:51 PM  Result Value Ref Range   Glucose-Capillary 133 (H) 70 - 99 mg/dL  Glucose, capillary     Status: None   Collection Time: 12/17/14  6:16 AM  Result Value Ref Range   Glucose-Capillary 98 70 - 99 mg/dL  Glucose, capillary     Status: Abnormal   Collection Time: 12/17/14 12:17 PM  Result Value Ref Range   Glucose-Capillary 107 (H) 70 - 99 mg/dL   Comment 1 Notify RN   Glucose,  capillary     Status: Abnormal   Collection Time: 12/17/14  4:48 PM  Result Value Ref Range   Glucose-Capillary 136 (H) 70 - 99 mg/dL  Glucose, capillary     Status: Abnormal   Collection Time: 12/17/14  8:57 PM  Result Value Ref Range    Glucose-Capillary 133 (H) 70 - 99 mg/dL  Glucose, capillary     Status: Abnormal   Collection Time: 12/18/14  6:39 AM  Result Value Ref Range   Glucose-Capillary 107 (H) 70 - 99 mg/dL    Physical Findings: AIMS: Facial and Oral Movements Muscles of Facial Expression: None, normal Lips and Perioral Area: None, normal Jaw: None, normal Tongue: None, normal,Extremity Movements Upper (arms, wrists, hands, fingers): None, normal Lower (legs, knees, ankles, toes): None, normal, Trunk Movements Neck, shoulders, hips: None, normal, Overall Severity Severity of abnormal movements (highest score from questions above): None, normal Incapacitation due to abnormal movements: None, normal Patient's awareness of abnormal movements (rate only patient's report): No Awareness, Dental Status Current problems with teeth and/or dentures?: No Does patient usually wear dentures?: No  CIWA:    COWS:  COWS Total Score: 0  Assessment: Pt is a 44 y old AAM with hx of HIV,DM,HTN,as well as polysubstance abuse presented with depression and SI after relapsing on cocaine. Pt today with some improvement of his depression , but with several somatic complaints.  Treatment Plan Summary: Daily contact with patient to assess and evaluate symptoms and progress in treatment and Medication management Will continue Wellbutrin to SR 100 mg po BID  for affective symptoms. Will continue Neurontin 100 mg TID for anxiety as well as diabetic neuropathy.  Continue Lisinopril to 5 mg daily for Hypertension.  Will continue Seroquel 50 mg po qhs for psychosis as well as sleep. Will continue to monitor vitals ,medication compliance and treatment side effects while patient is here.  Consulted hospitalist see consult note - pt had MRI scan brain done - Hospitalist has signed off after evaluation.  Patient with uncontrolled diabetes with improved blood sugar values on current diabetic regimen.  CSW will start working on  disposition.  Patient to participate in therapeutic milieu .   Medical Decision Making:  Established Problem, Stable/Improving (1), Review or order clinical lab tests (1), Review of Last Therapy Session (1) and Review of Medication Regimen & Side Effects (2)  DAVIS, LAURA  NP-C 12/18/2014, 5:31 PM I have been consulted about this patient and agree with the assessment and plan Geralyn Flash A. Perry.D.

## 2014-12-18 NOTE — Progress Notes (Signed)
Adult Psychoeducational Group Note  Date:  12/18/2014 Time:  9:20 PM  Group Topic/Focus:  Wrap-Up Group:   The focus of this group is to help patients review their daily goal of treatment and discuss progress on daily workbooks.  Participation Level:  Active  Participation Quality:  Appropriate and Attentive  Affect:  Appropriate  Cognitive:  Appropriate  Insight: Appropriate  Engagement in Group:  Engaged  Modes of Intervention:  Discussion  Additional Comments:  Pts discussed the positive events of their day and the theme for the day which is healthy support systems. Pt stated his family is his biggest support.  Clint Bolder 12/18/2014, 9:20 PM

## 2014-12-18 NOTE — Progress Notes (Signed)
D: Pt denies SI/HI/AV. Pt is pleasant and cooperative. Pt rates depression at a 6, anxiety at a 6, and Helplessness/hopelessness at a 7.  A: Pt was offered support and encouragement. Pt was given scheduled medications. Pt was encourage to attend groups. Q 15 minute checks were done for safety.  R:Pt attends groups and interacts well with peers and staff. Pt taking medication. Pt has no complaints.Pt receptive to treatment and safety maintained on unit.

## 2014-12-18 NOTE — [facility] Group Notes (Signed)
[facility] Group Notes:  (Clinical Social Work)  12/18/2014   11:15am-12:00pm  Summary of Progress/Problems:  The main focus of today's process group was to listen to a variety of genres of music and to identify that different types of music provoke different responses.  The patient then was able to identify personally what was soothing for them, as well as energizing.  Handouts were used to record feelings evoked, as well as how patient can personally use this knowledge in sleep habits, with depression, and with other symptoms.  The patient expressed understanding of concepts, as well as knowledge of how each type of music affected him/her and how this can be used at home as a wellness/recovery tool.  He sat with his eyes closed through group, did not elaborate on how different songs made him feel other than saying "good"' or "fine."  Smiled infrequently, but did smile.  Type of Therapy:  Music Therapy   Participation Level:  Active  Participation Quality:  Attentive and Sharing  Affect:  Flat & Depressed  Cognitive:  Oriented  Insight:  Engaged  Engagement in Therapy:  Engaged  Modes of Intervention:   Activity, Exploration  Selmer Dominion, LCSW 12/18/2014, 12:30pm

## 2014-12-19 ENCOUNTER — Other Ambulatory Visit: Payer: Self-pay | Admitting: Infectious Diseases

## 2014-12-19 ENCOUNTER — Other Ambulatory Visit: Payer: Self-pay | Admitting: *Deleted

## 2014-12-19 DIAGNOSIS — B20 Human immunodeficiency virus [HIV] disease: Secondary | ICD-10-CM

## 2014-12-19 LAB — GLUCOSE, CAPILLARY
GLUCOSE-CAPILLARY: 165 mg/dL — AB (ref 70–99)
GLUCOSE-CAPILLARY: 105 mg/dL — AB (ref 70–99)
GLUCOSE-CAPILLARY: 153 mg/dL — AB (ref 70–99)
Glucose-Capillary: 112 mg/dL — ABNORMAL HIGH (ref 70–99)
Glucose-Capillary: 145 mg/dL — ABNORMAL HIGH (ref 70–99)

## 2014-12-19 MED ORDER — EMTRICITAB-RILPIVIR-TENOFOV DF 200-25-300 MG PO TABS: 1.0000 | ORAL_TABLET | Freq: Every day | ORAL | Status: DC

## 2014-12-19 NOTE — Progress Notes (Signed)
Adult Psychoeducational Group Note  Date:  12/19/2014 Time:  10:06 PM  Group Topic/Focus:  Wrap-Up Group:   The focus of this group is to help patients review their daily goal of treatment and discuss progress on daily workbooks.  Participation Level:  Active  Participation Quality:  Appropriate  Affect:  Appropriate  Cognitive:  Appropriate  Insight: Appropriate  Engagement in Group:  Engaged  Modes of Intervention:  Education  Additional Comments:  The patient expressed that he learned from group to use his time wisely.The patient also said that his day went well.  Nash Shearer 12/19/2014, 10:06 PM

## 2014-12-19 NOTE — Evaluation (Signed)
Physical Therapy Evaluation Patient Details Name: Michael [name] MRN: [id number] DOB: 1951-08-10 Today's Date: 12/19/2014   History of Present Illness  64 yo with hx of HTN, DM2, CAD, back surgery, asthma, and polysubstance abuse. Admit due to overuse of cocaine and feelign is SI. REports feeling weak and dizzy , unsteady is reason for PT order.   Clinical Impression  Pt with some balance and weakness affecting normal gait patterns. Pt is independent and safe at the moment with gait, however to benefit from HEP given to patient to begin strengthening balance muscles, etc. Pt agreed and handout given to patient and nurse to progress and use.      Follow Up Recommendations No PT follow up    Equipment Recommendations   (we taled about where he could get a cane and reacher if needed and he agreed. )    Recommendations for Other Services       Precautions / Restrictions        Mobility  Bed Mobility Overal bed mobility: Independent                Transfers Overall transfer level: Independent                  Ambulation/Gait Ambulation/Gait assistance: Independent Ambulation Distance (Feet): 100 Feet Assistive device: None Gait Pattern/deviations: Step-through pattern;Wide base of support Gait velocity: very slow   General Gait Details: very slow and guarded with gait, could tell he does  not like to go out of his base of support , however walked without any LOB in quit envirnoment  Stairs            Wheelchair Mobility    Modified Rankin (Stroke Patients Only)       Balance                                   Berg Balance Test Transfers: Able to transfer safely, definite need of hands Standing Unsupported with Eyes Closed: Able to stand 10 seconds safely Standing Ubsupported with Feet Together: Able to place feet together independently and stand for 1 minute with supervision From Standing, Reach Forward with Outstretched Arm: Can  reach forward >5 cm safely (2") From Standing Position, Pick up Object from Floor: Unable to pick up and needs supervision From Standing Position, Turn to Look Behind Over each Shoulder: Looks behind one side only/other side shows less weight shift Turn 360 Degrees: Able to turn 360 degrees safely but slowly Standing Unsupported, One Foot in Front: Loses balance while stepping or standing Standing on One Leg: Tries to lift leg/unable to hold 3 seconds but remains standing independently         Pertinent Vitals/Pain Pain Assessment:  (not specific, just stated some pain all over, especially in back at times. However stated the lidocaine patch is really helping. )    Home Living Family/patient expects to be discharged to:: Private residence Living Arrangements: Alone Available Help at Discharge: Other (Comment) (unknown to physical therapist) Type of Home: Apartment Home Access: Level entry     Home Layout: One level Home Equipment: None (used to have a cane and reacher but does not have it now. Would like one. )      Prior Function Level of Independence: Independent               Hand Dominance        Extremity/Trunk Assessment  Lower Extremity Assessment: Overall WFL for tasks assessed         Communication   Communication: No difficulties  Cognition Arousal/Alertness: Awake/alert Behavior During Therapy: WFL for tasks assessed/performed Overall Cognitive Status: Within Functional Limits for tasks assessed                      General Comments General comments (skin integrity, edema, etc.): reports dizziness mostly after his medicines, not provoked by movement. Needs work on balance to increase safety, gave standing dynamic exercies at suported surface to increase balance     Exercises Other Exercises Other Exercises: balance exercies at counter top or railing: given to pt and performed: toe and heel raises 10x each. Side steppign  right and left , froward adn backward stepping and marching in place. and practicing standing in tandem with UE support for balance.  Other Exercises: Pt also asked about goigntot he gym of which he is a member. I educated on askign for help to to limit to 5 items at a time so he doesn't get overwhelmed. He liked this idea.       Assessment/Plan    PT Assessment Patent does not need any further PT services (pt states he is discharging tomorrow )  PT Diagnosis Difficulty walking   PT Problem List    PT Treatment Interventions     PT Goals (Current goals can be found in the Care Plan section) Acute Rehab PT Goals PT Goal Formulation: All assessment and education complete, DC therapy    Frequency     Barriers to discharge        Co-evaluation               End of Session   Activity Tolerance: Patient tolerated treatment well Patient left:  (hallway heading bac to group) Nurse Communication: Mobility status (do not feel he need a RW for safety feels he will progress  better off the RW )         Time: 1330-1353 PT Time Calculation (min) (ACUTE ONLY): 23 min   Charges:   PT Evaluation $Initial PT Evaluation Tier I: 1 Procedure     PT G CodesClide Dales 01-14-2015, 3:23 PM  Clide Dales, PT Pager: [phone] 01-14-2015

## 2014-12-19 NOTE — Telephone Encounter (Signed)
Pt needs to make and keep office visit with [name].

## 2014-12-19 NOTE — Progress Notes (Signed)
Patient ID: Michael [name], male   DOB: 05-19-1951, 64 y.o.   MRN:  [facility] MD Progress Note  12/19/2014 12:04 PM [name]  MRN:  [id number] Subjective: Patient states "I still have SI on and off , I am trying to cope with it " Objective; Patient seen and chart reviewed. Pt presented with worsening depression, after relapsing on cocaine. Pt uses a lot - used 500$ worth recently.Pt with diagnosis of HIV , loneliness is the main trigger.  Pt with continued SI , but his depression seems to be improving on the current medication regimen. Discussed coping techniques , relaxation techniques, provided supportive therapy. Patient encouraged to attend group activities as well as take his medications.Patient with sleep issues , seems to be improving on current medications. Pain is a major concern, pt wants to be on lidocaine patch. This could also be contributing to his sleep issues as well as mood. Pt is compliant on medications. Denies side effects.     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 Type 2 diabetes mellitus with complication [id number]   . HIV disease [B20] 12/14/2014  . Diplopia [H53.2] 12/14/2014  . Cocaine use disorder, severe, dependence [F14.20] 12/13/2014  . Substance or medication-induced depressive disorder with onset during withdrawal [F19.94] 12/13/2014  . 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 DM (diabetes mellitus) [E11.9] 12/13/2014  . Cervicalgia [M54.2] 06/28/2014  . Lumbar  radiculopathy, chronic [M54.16] 06/28/2014  . Asthma, chronic [J45.909] 02/03/2014  . 3-vessel CAD [I25.10] 06/24/2013  . Arrhythmia [I49.9] 11/13/2012  . ED (erectile dysfunction) of organic origin [N52.8] 07/07/2012  . Hypertension goal BP (blood pressure) < 140/80 [I10] 04/29/2012  . Chondromalacia of left knee [M94.262] 03/19/2012  . Hyperlipidemia with target LDL less than 100 [E78.5] 02/12/2012  . Fibromyalgia [M79.7] 02/12/2012  . Uncontrolled type 2 diabetes with neuropathy [E11.40, E11.65] 10/18/2011  . HIV (human immunodeficiency virus infection) [Z21] 08/27/2011   Total Time spent with patient: 30 minutes  Past Medical History:  Past Medical History  Diagnosis Date  . Diabetes mellitus     Diagnosed in 2002, started insulin in 2012  . Hypertension   . Headache(784.0)     CT head 08/2011: Periventricular and subcortical white matter hypodensities are most in keeping with chronic microangiopathic change  . Gout   . Hyperlipidemia   . HIV infection Nov 2012    Followed by [name]  . Crack cocaine use     for 20+ years, has been enrolled in detox programs in the past  . Chondromalacia of medial femoral condyle     Left knee MRI 04/28/12: Chondromalacia of the medial femoral condyle with slight peripheral degeneration of the meniscocapsular junction of the medial meniscus; followed by sports medicine  . Asthma     No PFTs, history of childhood asthma  . Depression     with history of hospitalization for suicidal ideation  . CAD (coronary artery disease)     a. 06/2013 STEMI/PCI ([facility]): LAD w/ thrombus (treated with BMS), mid 75%, D2 75%; LCX OM2 75%; RCA small, PDA 95%, PLV 95%;  b. 10/2013 Cath/PCI: ISR w/in LAD (Promus DES x  2), borderline OM2 lesion;  c. 01/2014 MV: Intermediate risk, medium-sized distal ant wall infarct w/ very small amt of peri-infarct ischemia. EF 60%.  . Gout 04/28/2012  . Collagen vascular disease   . Cellulitis 04/2014    left facial    Past  Surgical History  Procedure Laterality Date  . Prostate surgery    . Bowel resection    . Back surgery      1988  . Cardiac surgery    . Cervical spine surgery      " rods in my neck "  . Coronary stent placement    . Nm myocar perf wall motion  12/27/2011    normal   Family History:  Family History  Problem Relation Age of Onset  . Diabetes Mother   . Diabetes Father   . Diabetes Brother   . Colon cancer Neg Hx    Social History:  History  Alcohol Use No     History  Drug Use  . Yes  . Special: "Crack" cocaine, Cocaine    Comment: last used crack 04/2014.    History   Social History  . Marital Status: Divorced    Spouse Name: N/A  . Number of Children: N/A  . Years of Education: N/A   Social History Main Topics  . Smoking status: Never Smoker   . Smokeless tobacco: Never Used  . Alcohol Use: No  . Drug Use: Yes    Special: "Crack" cocaine, Cocaine     Comment: last used crack 04/2014.  Marland Kitchen Sexual Activity: No     Comment: accepted condoms   Other Topics Concern  . None   Social History Narrative   Currently staying with a friend in [place].  Was staying @ local motel until a few days ago - left b/c of bed bugs.   Additional History:    Sleep: Fair  Appetite:  Fair  Musculoskeletal: Strength & Muscle Tone: within normal limits Gait & Station: normal Patient leans: N/A  Psychiatric Specialty Exam: Physical Exam  Review of Systems  Musculoskeletal: Positive for back pain.  Psychiatric/Behavioral: Positive for depression, suicidal ideas and substance abuse.    Blood pressure 127/79, pulse 74, temperature 97.5 F (36.4 C), temperature source Oral, resp. rate 18, height 5\' 7"  (1.702 m), weight 77.111 kg (170 lb), SpO2 100 %.Body mass index is 26.62 kg/(m^2).  General Appearance: Casual  Eye Contact::  Good  Speech:  Normal Rate  Volume:  Normal  Mood:  Anxious and Depressed improving  Affect:  Congruent  Thought Process:  Goal Directed  Orientation:   Full (Time, Place, and Person)  Thought Content:  Hallucinations: Auditory and Rumination improving  Suicidal Thoughts:  Yes.  without intent/planfleeting   Homicidal Thoughts:  No  Memory:  Immediate;   Fair Recent;   Fair Remote;   Fair  Judgement:  Fair  Insight:  Fair  Psychomotor Activity:  Normal  Concentration:  Fair  Recall:  [employer] of Knowledge:Fair  Language: Fair  Akathisia:  No  Handed:  Right  AIMS (if indicated):     Assets:  Communication Skills Desire for Improvement  ADL's:  Intact  Cognition: WNL  Sleep:  Number of Hours: 6.25     Current Medications: Current Facility-Administered Medications  Medication Dose Route Frequency Provider Last Rate Last Dose  . acetaminophen (TYLENOL) tablet 650 mg  650 mg Oral Q6H PRN Delfin Gant, NP   650 mg at 12/18/14 2109  . albuterol (  PROVENTIL HFA;VENTOLIN HFA) 108 (90 BASE) MCG/ACT inhaler 2 puff  2 puff Inhalation Q6H PRN Delfin Gant, NP      . allopurinol (ZYLOPRIM) tablet 200 mg  200 mg Oral Daily Delfin Gant, NP   200 mg at 12/19/14 0815  . aspirin EC tablet 81 mg  81 mg Oral Daily Delfin Gant, NP   81 mg at 12/19/14 0815  . buPROPion (WELLBUTRIN SR) 12 hr tablet 100 mg  100 mg Oral BID Ursula Alert, MD   100 mg at 12/19/14 0815  . clopidogrel (PLAVIX) tablet 75 mg  75 mg Oral Daily Delfin Gant, NP   75 mg at 12/19/14 0815  . diclofenac sodium (VOLTAREN) 1 % transdermal gel 2 g  2 g Topical Daily PRN Delfin Gant, NP   2 g at 12/17/14 1826  . Emtricitab-Rilpivir-Tenofovir 200-25-300 MG TABS 1 tablet  1 tablet Oral Daily Delfin Gant, NP   1 tablet at 12/19/14 0815  . gabapentin (NEURONTIN) capsule 100 mg  100 mg Oral TID Niel Hummer, NP   100 mg at 12/19/14 0815  . hydrOXYzine (ATARAX/VISTARIL) tablet 25 mg  25 mg Oral Q6H PRN Kerrie Buffalo, NP   25 mg at 12/18/14 2110  . insulin aspart (novoLOG) injection 0-15 Units  0-15 Units Subcutaneous TID WC Ursula Alert,  MD   3 Units at 12/18/14 1700  . insulin aspart (novoLOG) injection 0-5 Units  0-5 Units Subcutaneous QHS Ursula Alert, MD   0 Units at 12/13/14 2200  . insulin glargine (LANTUS) injection 25 Units  25 Units Subcutaneous QHS Ursula Alert, MD   25 Units at 12/18/14 2110  . ipratropium-albuterol (DUONEB) 0.5-2.5 (3) MG/3ML nebulizer solution 3 mL  3 mL Nebulization Q4H PRN Delfin Gant, NP      . isosorbide mononitrate (IMDUR) 24 hr tablet 30 mg  30 mg Oral Daily Delfin Gant, NP   30 mg at 12/19/14 0815  . lidocaine (LIDODERM) 5 % 2 patch  2 patch Transdermal Q24H Ursula Alert, MD   2 patch at 12/19/14 0932  . lisinopril (PRINIVIL,ZESTRIL) tablet 5 mg  5 mg Oral Daily Niel Hummer, NP   5 mg at 12/19/14 0815  . metFORMIN (GLUCOPHAGE) tablet 1,000 mg  1,000 mg Oral BID WC Delfin Gant, NP   1,000 mg at 12/19/14 0815  . metoprolol succinate (TOPROL-XL) 24 hr tablet 50 mg  50 mg Oral Daily Delfin Gant, NP   50 mg at 12/19/14 0815  . ondansetron (ZOFRAN-ODT) disintegrating tablet 4 mg  4 mg Oral Q8H PRN Ursula Alert, MD   4 mg at 12/17/14 0807  . pravastatin (PRAVACHOL) tablet 40 mg  40 mg Oral q1800 Delfin Gant, NP   40 mg at 12/18/14 1716  . QUEtiapine (SEROQUEL) tablet 50 mg  50 mg Oral QHS Ursula Alert, MD   50 mg at 12/18/14 2109    Lab Results:  Results for orders placed or performed during the hospital encounter of 12/12/14 (from the past 48 hour(s))  Glucose, capillary     Status: Abnormal   Collection Time: 12/17/14 12:17 PM  Result Value Ref Range   Glucose-Capillary 107 (H) 70 - 99 mg/dL   Comment 1 Notify RN   Glucose, capillary     Status: Abnormal   Collection Time: 12/17/14  4:48 PM  Result Value Ref Range   Glucose-Capillary 136 (H) 70 - 99 mg/dL  Glucose, capillary  Status: Abnormal   Collection Time: 12/17/14  8:57 PM  Result Value Ref Range   Glucose-Capillary 133 (H) 70 - 99 mg/dL  Glucose, capillary     Status: Abnormal    Collection Time: 12/18/14  6:39 AM  Result Value Ref Range   Glucose-Capillary 107 (H) 70 - 99 mg/dL  Glucose, capillary     Status: None   Collection Time: 12/18/14 12:08 PM  Result Value Ref Range   Glucose-Capillary 95 70 - 99 mg/dL  Glucose, capillary     Status: Abnormal   Collection Time: 12/18/14  5:11 PM  Result Value Ref Range   Glucose-Capillary 165 (H) 70 - 99 mg/dL  Glucose, capillary     Status: Abnormal   Collection Time: 12/18/14  9:06 PM  Result Value Ref Range   Glucose-Capillary 188 (H) 70 - 99 mg/dL  Glucose, capillary     Status: Abnormal   Collection Time: 12/19/14  6:36 AM  Result Value Ref Range   Glucose-Capillary 105 (H) 70 - 99 mg/dL  Glucose, capillary     Status: Abnormal   Collection Time: 12/19/14 11:30 AM  Result Value Ref Range   Glucose-Capillary 112 (H) 70 - 99 mg/dL   Comment 1 Document in Chart    Comment 2 Repeat Test     Physical Findings: AIMS: Facial and Oral Movements Muscles of Facial Expression: None, normal Lips and Perioral Area: None, normal Jaw: None, normal Tongue: None, normal,Extremity Movements Upper (arms, wrists, hands, fingers): None, normal Lower (legs, knees, ankles, toes): None, normal, Trunk Movements Neck, shoulders, hips: None, normal, Overall Severity Severity of abnormal movements (highest score from questions above): None, normal Incapacitation due to abnormal movements: None, normal Patient's awareness of abnormal movements (rate only patient's report): No Awareness, Dental Status Current problems with teeth and/or dentures?: No Does patient usually wear dentures?: No  CIWA:    COWS:  COWS Total Score: 0  Assessment: Pt is a 64 y old AAM with hx of HIV,DM,HTN,as well as polysubstance abuse presented with depression and SI after relapsing on cocaine. Pt today continues to have SI.  Treatment Plan Summary: Daily contact with patient to assess and evaluate symptoms and progress in treatment and Medication  management Will continue Wellbutrin SR 100 mg po BID  for affective symptoms.Pt is already on multiple medications as well as have GI issues secondary to polypharmacy. Pt wants to continue on the same dose of wellbutrin for now. Discussed supportive measures, coping techniques . Will monitor. Will continue Neurontin 100 mg TID for anxiety as well as diabetic neuropathy.  Continue Lisinopril to 5 mg daily for Hypertension.  Will continue Seroquel 50 mg po qhs for psychosis as well as sleep. Will continue to monitor vitals ,medication compliance and treatment side effects while patient is here.  Consulted hospitalist see consult note - pt had MRI scan brain done - Hospitalist has signed off after evaluation.  Patient with uncontrolled diabetes with improved blood sugar values on current diabetic regimen.  CSW will start working on disposition.  Patient to participate in therapeutic milieu .   Medical Decision Making:  Established Problem, Stable/Improving (1), Review of Psycho-Social Stressors (1), Review or order clinical lab tests (1), Review and summation of old records (2), Review of Last Therapy Session (1) and Review of Medication Regimen & Side Effects (2)  Kyoko Elsea  MD 12/19/2014, 12:04 PM

## 2014-12-19 NOTE — Progress Notes (Signed)
D: Pt denies SI/HI/AV. Pt is pleasant and cooperative. Pt rates depression at a 7, anxiety at a 7, and Helplessness/hopelessness at a 7.  A: Pt was offered support and encouragement. Pt was given scheduled medications. Pt was encourage to attend groups. Q 15 minute checks were done for safety.  R:Pt attends groups and interacts well with peers and staff. Pt taking medication. Pt has no complaints.Pt receptive to treatment and safety maintained on unit.

## 2014-12-19 NOTE — [facility] Group Notes (Signed)
[place] LCSW Group Therapy  12/19/2014 1:15 pm  Type of Therapy: Process Group Therapy  Participation Level:  Active  Participation Quality:  Appropriate  Affect:  Flat  Cognitive:  Oriented  Insight:  Improving  Engagement in Group:  Limited  Engagement in Therapy:  Limited  Modes of Intervention:  Activity, Clarification, Education, Problem-solving and Support  Summary of Progress/Problems: Today's group addressed the issue of overcoming obstacles.  Patients were asked to identify their biggest obstacle post d/c that stands in the way of their on-going success, and then problem solve as to how to manage this.  Latonio continues to be somatically focused, but to his credit was able to do some problem solving about not overextending himself.  He is also working to identify ways to keep himself occupied during the day.  Trish Mage 12/19/2014   3:48 PM
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
## 2014-12-19 NOTE — Plan of Care (Signed)
Problem: Alteration in mood Goal: STG-Patient reports thoughts of self-harm to staff Outcome: Progressing On 12/18/14, pt reported to writer that he has had thoughts of self-harm/SI.  He reported the thought "comes and goes."  Pt verbally contracted for safety.

## 2014-12-19 NOTE — Progress Notes (Signed)
Patient in day room during this assessment. He reported that he is scheduled for discharge tomorrow, said he still passive SI and not feeling ready for discharge, he denied hallucinations. Writer encouraged patient to discuss with the physician in the morning. He contracted for safety.  Writer encouraged patient to go out more after discharge and not isolating himself. Patient said he will join [employer], that his insurance will pay for it and that he will look for a good church where he will feel comfortable and start attending regularly. Q 15 minute check continues as ordered for safety.

## 2014-12-20 LAB — GLUCOSE, CAPILLARY: GLUCOSE-CAPILLARY: 89 mg/dL (ref 70–99)

## 2014-12-20 MED ORDER — INSULIN GLARGINE 100 UNIT/ML ~~LOC~~ SOLN: 25.0000 [IU] | Freq: Every day | SUBCUTANEOUS | Status: DC

## 2014-12-20 MED ORDER — HYDROXYZINE HCL 25 MG PO TABS: 25.0000 mg | ORAL_TABLET | Freq: Four times a day (QID) | ORAL | Status: DC | PRN

## 2014-12-20 MED ORDER — ASPIRIN EC 81 MG PO TBEC: 81.0000 mg | DELAYED_RELEASE_TABLET | Freq: Every day | ORAL | Status: DC

## 2014-12-20 MED ORDER — ALLOPURINOL 100 MG PO TABS: 200.0000 mg | ORAL_TABLET | Freq: Every day | ORAL | Status: DC

## 2014-12-20 MED ORDER — QUETIAPINE FUMARATE 50 MG PO TABS: 50.0000 mg | ORAL_TABLET | Freq: Every day | ORAL | Status: DC

## 2014-12-20 MED ORDER — IPRATROPIUM-ALBUTEROL 0.5-2.5 (3) MG/3ML IN SOLN: 3.0000 mL | RESPIRATORY_TRACT | Status: DC | PRN

## 2014-12-20 MED ORDER — BUPROPION HCL ER (SR) 100 MG PO TB12: 100.0000 mg | ORAL_TABLET | Freq: Two times a day (BID) | ORAL | Status: DC

## 2014-12-20 MED ORDER — CLOPIDOGREL BISULFATE 75 MG PO TABS
75.0000 mg | ORAL_TABLET | Freq: Every day | ORAL | Status: DC
Start: 2014-12-20 — End: 2015-03-03

## 2014-12-20 MED ORDER — EMTRICITAB-RILPIVIR-TENOFOV DF 200-25-300 MG PO TABS: 1.0000 | ORAL_TABLET | Freq: Every day | ORAL | Status: DC

## 2014-12-20 MED ORDER — PRAVASTATIN SODIUM 40 MG PO TABS: 40.0000 mg | ORAL_TABLET | Freq: Every day | ORAL | Status: DC

## 2014-12-20 MED ORDER — ISOSORBIDE MONONITRATE ER 30 MG PO TB24: 30.0000 mg | ORAL_TABLET | Freq: Every day | ORAL | Status: DC

## 2014-12-20 MED ORDER — LIDOCAINE 5 % EX PTCH: 2.0000 | MEDICATED_PATCH | CUTANEOUS | Status: DC

## 2014-12-20 MED ORDER — DICLOFENAC SODIUM 1 % TD GEL: 2.0000 g | Freq: Every day | TRANSDERMAL | Status: DC | PRN

## 2014-12-20 MED ORDER — ALBUTEROL SULFATE HFA 108 (90 BASE) MCG/ACT IN AERS: 2.0000 | INHALATION_SPRAY | Freq: Four times a day (QID) | RESPIRATORY_TRACT | Status: DC | PRN

## 2014-12-20 MED ORDER — LISINOPRIL 10 MG PO TABS: 5.0000 mg | ORAL_TABLET | Freq: Every day | ORAL | Status: DC

## 2014-12-20 MED ORDER — METOPROLOL SUCCINATE ER 50 MG PO TB24: 50.0000 mg | ORAL_TABLET | Freq: Every day | ORAL | Status: DC

## 2014-12-20 MED ORDER — GABAPENTIN 100 MG PO CAPS: 100.0000 mg | ORAL_CAPSULE | Freq: Three times a day (TID) | ORAL | Status: DC

## 2014-12-20 MED ORDER — INSULIN ASPART 100 UNIT/ML ~~LOC~~ SOLN: 4.0000 [IU] | Freq: Three times a day (TID) | SUBCUTANEOUS | Status: DC

## 2014-12-20 MED ORDER — METFORMIN HCL 1000 MG PO TABS: 1000.0000 mg | ORAL_TABLET | Freq: Two times a day (BID) | ORAL | Status: DC

## 2014-12-20 NOTE — Progress Notes (Signed)
From self-inventory: pt reports fair sleep, fair appetite, low energy, and poor concentration. He rates his depression, hopelessness, and anxiety all at sevens. He reports intermittent SI but contracts for safety.

## 2014-12-20 NOTE — Tx Team (Signed)
  Interdisciplinary Treatment Plan Update   Date Reviewed:  12/20/2014  Time Reviewed:  2:09 PM  Progress in Treatment:   Attending groups: Yes Participating in groups: Yes Taking medication as prescribed: Yes  Tolerating medication: Yes Family/Significant other contact made: Yes  Patient understands diagnosis: Yes  Discussing patient identified problems/goals with staff: Yes  See initial care plan Medical problems stabilized or resolved: Yes Denies suicidal/homicidal ideation: Yes  In tx team Patient has not harmed self or others: Yes  For review of initial/current patient goals, please see plan of care.  Estimated Length of Stay:  D/C today  Reason for Continuation of Hospitalization:   New Problems/Goals identified:  N/A  Discharge Plan or Barriers:   return home, follow up outpt  Additional Comments:  Attendees:  Signature: Steva Colder, MD 12/20/2014 2:09 PM   Signature: Ripley Fraise, [place] 12/20/2014 2:09 PM  Signature:  12/20/2014 2:09 PM  Signature:  12/20/2014 2:09 PM  Signature: Darrol Angel, RN 12/20/2014 2:09 PM  Signature:  12/20/2014 2:09 PM  Signature:   12/20/2014 2:09 PM  Signature:    Signature:    Signature:    Signature:    Signature:    Signature:      Scribe for Treatment Team:   [employer], LCSW  12/20/2014 2:09 PM

## 2014-12-20 NOTE — Discharge Summary (Signed)
Physician Discharge Summary Note  Patient:  Michael [name] is an 64 y.o., male MRN:  [id number] DOB:  1951/06/11 Patient phone:  [phone] (home)  Patient address:   [place] [address],  Total Time spent with patient: Greater than 30 minutes  Date of Admission:  12/12/2014 Date of Discharge: 12/20/14  Reason for Admission: Mood stabilization treatment  Principal Problem: Substance or medication-induced depressive disorder with onset during withdrawal Discharge Diagnoses: Patient Active Problem List   Diagnosis Date Noted  . Type 2 diabetes mellitus with complication Q000111Q   . HIV disease [B20] 12/14/2014  . Diplopia [H53.2] 12/14/2014  . Cocaine use disorder, severe, dependence [F14.20] 12/13/2014  . Substance or medication-induced depressive disorder with onset during withdrawal [F19.94] 12/13/2014  . 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 DM (diabetes mellitus) [E11.9] 12/13/2014  . Cervicalgia [M54.2] 06/28/2014  . Lumbar radiculopathy, chronic [M54.16] 06/28/2014  . Asthma, chronic [J45.909] 02/03/2014  . 3-vessel CAD [I25.10] 06/24/2013  . Arrhythmia [I49.9] 11/13/2012  . ED (erectile dysfunction) of organic origin [N52.8] 07/07/2012  . Hypertension goal BP (blood pressure) < 140/80 [I10] 04/29/2012  . Chondromalacia of left knee [M94.262] 03/19/2012  . Hyperlipidemia with target LDL less than 100 [E78.5] 02/12/2012  . Fibromyalgia [M79.7] 02/12/2012  . Uncontrolled type 2 diabetes with neuropathy [E11.40, E11.65] 10/18/2011  . HIV (human immunodeficiency virus infection) [Z21] 08/27/2011   Musculoskeletal: Strength & Muscle Tone: within normal limits Gait & Station: normal Patient leans: N/A  Psychiatric Specialty Exam: Physical Exam  Psychiatric: His speech is normal and behavior is normal. Judgment and thought content normal. His mood appears not anxious. His  affect is not angry, not blunt, not labile and not inappropriate. Cognition and memory are normal. He does not exhibit a depressed mood.    Review of Systems  Constitutional: Negative.   HENT: Negative.   Eyes: Negative.   Respiratory: Negative.   Cardiovascular: Negative.   Gastrointestinal: Negative.   Genitourinary: Negative.   Musculoskeletal: Negative.   Skin: Negative.   Neurological: Negative.   Endo/Heme/Allergies: Negative.   Psychiatric/Behavioral: Positive for depression (Stable), hallucinations (Hx of) and substance abuse (Cocane dependence). Negative for suicidal ideas and memory loss. The patient is not nervous/anxious. Insomnia: Stable.     Blood pressure 132/89, pulse 62, temperature 98.2 F (36.8 C), temperature source Oral, resp. rate 20, height 5\' 7"  (1.702 m), weight 77.111 kg (170 lb), SpO2 100 %.Body mass index is 26.62 kg/(m^2).  See Md's SRA   Past Medical History:  Past Medical History  Diagnosis Date  . Diabetes mellitus     Diagnosed in 2002, started insulin in 2012  . Hypertension   . Headache(784.0)     CT head 08/2011: Periventricular and subcortical white matter hypodensities are most in keeping with chronic microangiopathic change  . Gout   . Hyperlipidemia   . HIV infection Nov 2012    Followed by [name]  . Crack cocaine use     for 20+ years, has been enrolled in detox programs in the past  . Chondromalacia of medial femoral condyle     Left knee MRI 04/28/12: Chondromalacia of the medial femoral condyle with slight peripheral degeneration of the meniscocapsular junction of the medial meniscus; followed by sports medicine  . Asthma     No PFTs, history of childhood asthma  . Depression     with history of hospitalization for suicidal ideation  . CAD (coronary artery  disease)     a. 06/2013 STEMI/PCI ([facility]): LAD w/ thrombus (treated with BMS), mid 75%, D2 75%; LCX OM2 75%; RCA small, PDA 95%, PLV 95%;  b. 10/2013 Cath/PCI: ISR w/in LAD  (Promus DES x 2), borderline OM2 lesion;  c. 01/2014 MV: Intermediate risk, medium-sized distal ant wall infarct w/ very small amt of peri-infarct ischemia. EF 60%.  . Gout 04/28/2012  . Collagen vascular disease   . Cellulitis 04/2014    left facial    Past Surgical History  Procedure Laterality Date  . Prostate surgery    . Bowel resection    . Back surgery      1988  . Cardiac surgery    . Cervical spine surgery      " rods in my neck "  . Coronary stent placement    . Nm myocar perf wall motion  12/27/2011    normal   Family History:  Family History  Problem Relation Age of Onset  . Diabetes Mother   . Diabetes Father   . Diabetes Brother   . Colon cancer Neg Hx    Social History:  History  Alcohol Use No     History  Drug Use  . Yes  . Special: "Crack" cocaine, Cocaine    Comment: last used crack 04/2014.    History   Social History  . Marital Status: Divorced    Spouse Name: N/A  . Number of Children: N/A  . Years of Education: N/A   Social History Main Topics  . Smoking status: Never Smoker   . Smokeless tobacco: Never Used  . Alcohol Use: No  . Drug Use: Yes    Special: "Crack" cocaine, Cocaine     Comment: last used crack 04/2014.  Marland Kitchen Sexual Activity: No     Comment: accepted condoms   Other Topics Concern  . None   Social History Narrative   Currently staying with a friend in [place].  Was staying @ local motel until a few days ago - left b/c of bed bugs.   Risk to Self: No  Risk to Others: No Prior Inpatient Therapy: No Prior Outpatient Therapy:  No  Level of Care:  OP  Hospital Course:  Michael [name] is an 64 y.o. AA male who is divorced , lives by self in [place] ,[place], presented to the [facility] ED requesting detox from cocaine and endorsing SI. Pt relapsed on 12/08/2014 on $400-500 worth of crack cocaine. Pt was clean for 6-7 months before relapse. Pt stated that he has been depressed for 20 years. Pt reported hopelessness, helplessness,  fatigue, insomnia, increased irritability and isolating behavior. Pt only gets 4 hours of sleep a night.   Michael [name] was admitted to the adult unit for suicidal ideation requesting detoxification treatment as well. He was also presenting with symptoms of depression. During his admission assessment, he was evaluated and his symptoms were identified. Medication management was discussed and initiated targeting his presenting symptoms. He was oriented to the unit and encouraged to participate in unit programming. His other pre-existing medical problems were identified and treated appropriately by resuming his pertinent home medication for those health issues. But he did not receive any detoxification treatment as cocaine has no established detox protocols.  During the course of his hospital stay, Sy was evaluated each day by a clinical provider to ascertain his response to his treatment regimen. Medication changes & adjustments were made according to need. As the day goes by, improvement  was noted as evidenced by his report of decreasing symptoms, improved sleep, appetite, affect, medication tolerance, behavior, and participation in the unit programming.  He was required on daily basis to complete a self inventory asssessment noting mood, mental status, pain, new symptoms, anxiety and concerns. His symptoms responded well to his treatment regimen, being in a therapeutic and supportive environment also assisted in his mood stability. Theador did present appropriate behavior & was motivated for recovery. He worked closely with the treatment team and case manager to develop a discharge plan with appropriate goals to maintain mood stability after discharge. Coping skills, problem solving as well as relaxation therapies were also part of the unit programming.  On this day of his discharge, Latanya was in much improved condition than upon admission. His symptoms were reported as significantly decreased or resolved  completely. Upon discharge, he denies SI/HI and voiced no AVH. He was motivated to continue taking medication with a goal of continued improvement in mental health. He was medicated & discharge on; Wellbutrin SR 100 mg for depression, Gabapentin 100 mg tid for agitation/pain management, Hydroxyzine 25 mg prn for anxiety & Seroquel 50 mg Q bedtime for mood control. Dimitrious is currently being discharged to follow-up care at the [facility] for medication management & routine psychiatric care. And for medical care, he will be receiving this service at the [facility] primary care clinic. He is provided with all the necessary information needed to make these appointments without problems. He  was provided with a 3 days worth, supply samples of his [facility] discharge medications. He left [facility] in no apparent distress with all belongings. Transportation per city bus. [place] assisted with bus pass.  Consults:  psychiatry  Significant Diagnostic Studies:  labs:  CBC with diff, CMP, UDS, toxicology tests, U/A, reports reviewed, stable  Discharge Vitals:   Blood pressure 132/89, pulse 62, temperature 98.2 F (36.8 C), temperature source Oral, resp. rate 20, height 5\' 7"  (1.702 m), weight 77.111 kg (170 lb), SpO2 100 %. Body mass index is 26.62 kg/(m^2). Lab Results:   Results for orders placed or performed during the hospital encounter of 12/12/14 (from the past 72 hour(s))  Glucose, capillary     Status: Abnormal   Collection Time: 12/17/14  4:48 PM  Result Value Ref Range   Glucose-Capillary 136 (H) 70 - 99 mg/dL  Glucose, capillary     Status: Abnormal   Collection Time: 12/17/14  8:57 PM  Result Value Ref Range   Glucose-Capillary 133 (H) 70 - 99 mg/dL  Glucose, capillary     Status: Abnormal   Collection Time: 12/18/14  6:39 AM  Result Value Ref Range   Glucose-Capillary 107 (H) 70 - 99 mg/dL  Glucose, capillary     Status: None   Collection Time: 12/18/14 12:08 PM  Result Value Ref Range   Glucose-Capillary  95 70 - 99 mg/dL  Glucose, capillary     Status: Abnormal   Collection Time: 12/18/14  5:11 PM  Result Value Ref Range   Glucose-Capillary 165 (H) 70 - 99 mg/dL  Glucose, capillary     Status: Abnormal   Collection Time: 12/18/14  9:06 PM  Result Value Ref Range   Glucose-Capillary 188 (H) 70 - 99 mg/dL  Glucose, capillary     Status: Abnormal   Collection Time: 12/19/14  6:36 AM  Result Value Ref Range   Glucose-Capillary 105 (H) 70 - 99 mg/dL  Glucose, capillary     Status: Abnormal   Collection Time:  12/19/14 11:30 AM  Result Value Ref Range   Glucose-Capillary 112 (H) 70 - 99 mg/dL   Comment 1 Document in Chart    Comment 2 Repeat Test   Glucose, capillary     Status: Abnormal   Collection Time: 12/19/14  4:50 PM  Result Value Ref Range   Glucose-Capillary 153 (H) 70 - 99 mg/dL   Comment 1 Document in Chart    Comment 2 Repeat Test   Glucose, capillary     Status: Abnormal   Collection Time: 12/19/14  8:44 PM  Result Value Ref Range   Glucose-Capillary 145 (H) 70 - 99 mg/dL  Glucose, capillary     Status: None   Collection Time: 12/20/14 11:53 AM  Result Value Ref Range   Glucose-Capillary 89 70 - 99 mg/dL    Physical Findings: AIMS: Facial and Oral Movements Muscles of Facial Expression: None, normal Lips and Perioral Area: None, normal Jaw: None, normal Tongue: None, normal,Extremity Movements Upper (arms, wrists, hands, fingers): None, normal Lower (legs, knees, ankles, toes): None, normal, Trunk Movements Neck, shoulders, hips: None, normal, Overall Severity Severity of abnormal movements (highest score from questions above): None, normal Incapacitation due to abnormal movements: None, normal Patient's awareness of abnormal movements (rate only patient's report): No Awareness, Dental Status Current problems with teeth and/or dentures?: No Does patient usually wear dentures?: No  CIWA:    COWS:  COWS Total Score: 0   See Psychiatric Specialty Exam and  Suicide Risk Assessment completed by Attending Physician prior to discharge.  Discharge destination:  Home  Is patient on multiple antipsychotic therapies at discharge:  No   Has Patient had three or more failed trials of antipsychotic monotherapy by history:  No  Recommended Plan for Multiple Antipsychotic Therapies: NA    Medication List    STOP taking these medications        diclofenac 25 MG EC tablet  Commonly known as:  VOLTAREN     diltiazem 120 MG 24 hr capsule  Commonly known as:  DILACOR XR     DULoxetine 60 MG capsule  Commonly known as:  CYMBALTA     famotidine 20 MG tablet  Commonly known as:  PEPCID     fentaNYL 12 MCG/HR  Commonly known as:  DURAGESIC     ibuprofen 600 MG tablet  Commonly known as:  ADVIL,MOTRIN     Insulin Glargine 100 UNIT/ML Solostar Pen  Commonly known as:  LANTUS  Replaced by:  insulin glargine 100 UNIT/ML injection     methocarbamol 500 MG tablet  Commonly known as:  ROBAXIN     nitroGLYCERIN 0.4 MG SL tablet  Commonly known as:  NITROSTAT      TAKE these medications      Indication   albuterol 108 (90 BASE) MCG/ACT inhaler  Commonly known as:  PROVENTIL HFA;VENTOLIN HFA  Inhale 2 puffs into the lungs every 6 (six) hours as needed for wheezing or shortness of breath.   Indication:  Asthma     allopurinol 100 MG tablet  Commonly known as:  ZYLOPRIM  Take 2 tablets (200 mg total) by mouth daily. For gouty arthritis   Indication:  Gout     aspirin EC 81 MG tablet  Take 1 tablet (81 mg total) by mouth daily. For heart health   Indication:  Blood Clot, For heart health     buPROPion 100 MG 12 hr tablet  Commonly known as:  WELLBUTRIN SR  Take 1 tablet (100 mg  total) by mouth 2 (two) times daily. For depression   Indication:  Major Depressive Disorder     clopidogrel 75 MG tablet  Commonly known as:  PLAVIX  Take 1 tablet (75 mg total) by mouth daily. For prevention of stroke   Indication:  Stroke caused by a Blood  Clot     diclofenac sodium 1 % Gel  Commonly known as:  VOLTAREN  Apply 2 g topically daily as needed (pain).   Indication:  Joint Damage causing Pain and Loss of Function     Emtricitab-Rilpivir-Tenofovir 200-25-300 MG Tabs  Commonly known as:  COMPLERA  Take 1 tablet by mouth daily. For HIV infection   Indication:  HIV Disease     gabapentin 100 MG capsule  Commonly known as:  NEURONTIN  Take 1 capsule (100 mg total) by mouth 3 (three) times daily. For anxiety   Indication:  Anxiety     hydrOXYzine 25 MG tablet  Commonly known as:  ATARAX/VISTARIL  Take 1 tablet (25 mg total) by mouth every 6 (six) hours as needed for anxiety.   Indication:  Anxiety     insulin aspart 100 UNIT/ML injection  Commonly known as:  novoLOG  Inject 4 Units into the skin 3 (three) times daily before meals. For diabetes management   Indication:  Type 2 Diabetes     insulin glargine 100 UNIT/ML injection  Commonly known as:  LANTUS  Inject 0.25 mLs (25 Units total) into the skin at bedtime. For diabetes management   Indication:  Type 2 Diabetes     ipratropium-albuterol 0.5-2.5 (3) MG/3ML Soln  Commonly known as:  DUONEB  Take 3 mLs by nebulization every 4 (four) hours as needed (shortness of breath).   Indication:  Spasm of Lung Air Passages     isosorbide mononitrate 30 MG 24 hr tablet  Commonly known as:  IMDUR  Take 1 tablet (30 mg total) by mouth daily. For chest pain   Indication:  Chronic Angina Pectoris     lidocaine 5 %  Commonly known as:  LIDODERM  Place 2 patches onto the skin daily. Remove & Discard patch within 12 hours or as directed by MD: For pain managment   Indication:  Pain management     lisinopril 10 MG tablet  Commonly known as:  PRINIVIL,ZESTRIL  Take 0.5 tablets (5 mg total) by mouth daily. For high blood pressure   Indication:  High Blood Pressure     metFORMIN 1000 MG tablet  Commonly known as:  GLUCOPHAGE  Take 1 tablet (1,000 mg total) by mouth 2 (two) times  daily with a meal. For diabetes management   Indication:  Type 2 Diabetes     metoprolol succinate 50 MG 24 hr tablet  Commonly known as:  TOPROL-XL  Take 1 tablet (50 mg total) by mouth daily. Take with or immediately following a meal: For high blood pressure   Indication:  High Blood Pressure     pravastatin 40 MG tablet  Commonly known as:  PRAVACHOL  Take 1 tablet (40 mg total) by mouth daily. For high cholesterol   Indication:  Disease involving Cholesterol Deposits in the Arteries, Inherited Heterozygous Hypercholesterolemia     QUEtiapine 50 MG tablet  Commonly known as:  SEROQUEL  Take 1 tablet (50 mg total) by mouth at bedtime. For mood control   Indication:  Mood control       Follow-up Information    Follow up with [facility].   Why:  Go  to the walk-in clinic M-F between 8 and 10 AM for your hospital follow up appointment   Contact information:   [place] [phone]      Follow up with [facility] On 12/21/2014.   Why:  1:45 PM     Follow-up recommendations: Activity:  As tolerated Diet: As recommended by your primary care doctor. Keep all scheduled follow-up appointments as recommended.    Comments: Take all your medications as prescribed by your mental healthcare provider. Report any adverse effects and or reactions from your medicines to your outpatient provider promptly. Patient is instructed and cautioned to not engage in alcohol and or illegal drug use while on prescription medicines. In the event of worsening symptoms, patient is instructed to call the crisis hotline, 911 and or go to the nearest ED for appropriate evaluation and treatment of symptoms. Follow-up with your primary care provider for your other medical issues, concerns and or health care needs.    Total Discharge Time: Greater than 30 minutes  Signed: Encarnacion Slates, PMHNP-BC 12/20/2014, 12:43 PM

## 2014-12-20 NOTE — Progress Notes (Signed)
Michael [name] was escorted to lobby, where security was waiting to drive him to the bus stop. Michael [name] received/signed for belongings and AVS. Medication samples and prescriptions were given and reviewed.

## 2014-12-20 NOTE — [facility] Suicide Risk Assessment (Signed)
[facility] Discharge Suicide Risk Assessment   Demographic Factors:  Male  Total Time spent with patient: 45 minutes  Musculoskeletal: Strength & Muscle Tone: within normal limits Gait & Station: normal Patient leans: N/A  Psychiatric Specialty Exam: Physical Exam  Review of Systems  Psychiatric/Behavioral: Positive for substance abuse (STABLE). Negative for depression and suicidal ideas. The patient is not nervous/anxious and does not have insomnia.     Blood pressure 132/89, pulse 62, temperature 98.2 F (36.8 C), temperature source Oral, resp. rate 20, height 5\' 7"  (1.702 m), weight 77.111 kg (170 lb), SpO2 100 %.Body mass index is 26.62 kg/(m^2).  General Appearance: Casual  Eye Contact::  Fair  Speech:  Normal Rate409  Volume:  Normal  Mood:  Euthymic  Affect:  Appropriate  Thought Process:  Coherent  Orientation:  Full (Time, Place, and Person)  Thought Content:  WDL  Suicidal Thoughts:  No  Homicidal Thoughts:  No  Memory:  Immediate;   Fair Recent;   Fair Remote;   Fair  Judgement:  Fair  Insight:  Fair  Psychomotor Activity:  Normal  Concentration:  Fair  Recall:  [employer] of Knowledge:Fair  Language: Fair  Akathisia:  No  Handed:  Right  AIMS (if indicated):     Assets:  Communication Skills Desire for Improvement  Sleep:  Number of Hours: 5.25  Cognition: WNL  ADL's:  Intact   Have you used any form of tobacco in the last 30 days? (Cigarettes, Smokeless Tobacco, Cigars, and/or Pipes): No  Has this patient used any form of tobacco in the last 30 days? (Cigarettes, Smokeless Tobacco, Cigars, and/or Pipes) No  Mental Status Per Nursing Assessment::   On Admission:  Suicidal ideation indicated by patient  Current Mental Status by Physician: PT DENIES SI/HI/AH/VH  Loss Factors: NA  Historical Factors: Impulsivity  Risk Reduction Factors:   Positive social support and Positive therapeutic relationship  Continued Clinical Symptoms:   Alcohol/Substance Abuse/Dependencies  Cognitive Features That Contribute To Risk:  None    Suicide Risk:  Minimal: No identifiable suicidal ideation.  Patients presenting with no risk factors but with morbid ruminations; may be classified as minimal risk based on the severity of the depressive symptoms  Principal Problem: Substance or medication-induced depressive disorder with onset during withdrawal( COCAINE) Discharge Diagnoses:  Patient Active Problem List   Diagnosis Date Noted  . Type 2 diabetes mellitus with complication Q000111Q   . HIV disease [B20] 12/14/2014  . Diplopia [H53.2] 12/14/2014  . Cocaine use disorder, severe, dependence [F14.20] 12/13/2014  . Substance or medication-induced depressive disorder with onset during withdrawal [F19.94] 12/13/2014  . 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 DM (diabetes mellitus) [E11.9] 12/13/2014  . Cervicalgia [M54.2] 06/28/2014  . Lumbar radiculopathy, chronic [M54.16] 06/28/2014  . Asthma, chronic [J45.909] 02/03/2014  . 3-vessel CAD [I25.10] 06/24/2013  . Arrhythmia [I49.9] 11/13/2012  . ED (erectile dysfunction) of organic origin [N52.8] 07/07/2012  . Hypertension goal BP (blood pressure) < 140/80 [I10] 04/29/2012  . Chondromalacia of left knee [M94.262] 03/19/2012  . Hyperlipidemia with target LDL less than 100 [E78.5] 02/12/2012  . Fibromyalgia [M79.7] 02/12/2012  . Uncontrolled type 2 diabetes with neuropathy [E11.40, E11.65] 10/18/2011  . HIV (human immunodeficiency virus infection) [Z21] 08/27/2011    Follow-up Information    Follow up with [facility].   Why:  Go to the walk-in clinic M-F between 8 and 10 AM for your hospital follow up appointment   Contact information:  [id number]  [place]  [phone]      Follow up with [facility] On 12/21/2014.   Why:  1:45 PM      Plan Of Care/Follow-up recommendations:  Activity:   No restrictions Diet:  carb modified Tests:  as needed Other:  follow up with after care as scheduled.  Is patient on multiple antipsychotic therapies at discharge:  No   Has Patient had three or more failed trials of antipsychotic monotherapy by history:  No  Recommended Plan for Multiple Antipsychotic Therapies: NA    Boen Sterbenz MD 12/20/2014, 9:27 AM

## 2014-12-20 NOTE — Progress Notes (Signed)
Michael [name] is calm and appears sad this a.m. When given a.m medications,  he says, "I hate taking this. It makes me sleepy, tired, it hurts my stomach. I guess I have to deal with it." He seems resigned. He admits SI with no current plan. He says he feels safe on the unit but is uncertain about elsewhere. He says he sometimes thinks about walking into traffic. Last night, he said he heard music last night and walked out into the hallway to find the source. When asked, he says home "is a prison" and says he knows no one in town. Will continue to monitor for needs/safety.

## 2014-12-20 NOTE — [facility] Group Notes (Signed)
[facility] Group Notes:  (Nursing/MHT/Case Management/Adjunct)  Date:  12/20/2014  Time:  10:11 AM  Type of Therapy:  Nurse Education 2 Participation Level:  Active  Participation Quality:  Appropriate and Attentive  Affect:  Appropriate  Cognitive:  Alert, Appropriate and Oriented  Insight:  Appropriate  Engagement in Group:  Engaged  Modes of Intervention:  Discussion, Education and Support  Summary of Progress/Problems: Mr. Donati talked about preparing for discharge and what he would do to aid his recovery upon discharge today, such as going to the [facility].  Marya Landry 12/20/2014, 10:11 AM

## 2014-12-21 ENCOUNTER — Encounter: Payer: Self-pay | Admitting: Internal Medicine

## 2014-12-21 LAB — GLUCOSE, CAPILLARY: GLUCOSE-CAPILLARY: 102 mg/dL — AB (ref 70–99)

## 2014-12-23 NOTE — Progress Notes (Signed)
Patient Discharge Instructions:  After Visit Summary (AVS):   Faxed to:  12/23/14 Discharge Summary Note:   Faxed to:  12/23/14 Psychiatric Admission Assessment Note:   Faxed to:  12/23/14 Suicide Risk Assessment - Discharge Assessment:   Faxed to:  12/23/14 Faxed/Sent to the Next Level Care provider:  12/23/14 Faxed to [facility] [place], 12/23/2014, 1:38 PM

## 2014-12-26 ENCOUNTER — Other Ambulatory Visit: Payer: Self-pay | Admitting: Internal Medicine

## 2015-01-06 ENCOUNTER — Telehealth: Payer: Self-pay | Admitting: Internal Medicine

## 2015-01-06 NOTE — Telephone Encounter (Signed)
Call to patient to confirm appointment for 01/09/15 at 10:15 lmtcb.

## 2015-01-09 ENCOUNTER — Ambulatory Visit: Payer: Self-pay | Admitting: Internal Medicine

## 2015-01-17 ENCOUNTER — Other Ambulatory Visit: Payer: Self-pay | Admitting: Internal Medicine

## 2015-01-17 ENCOUNTER — Other Ambulatory Visit: Payer: Self-pay | Admitting: Infectious Diseases

## 2015-01-18 ENCOUNTER — Other Ambulatory Visit: Payer: Self-pay | Admitting: Infectious Diseases

## 2015-01-18 DIAGNOSIS — B20 Human immunodeficiency virus [HIV] disease: Secondary | ICD-10-CM

## 2015-01-18 NOTE — Telephone Encounter (Signed)
Made pt appt for March 20, 2015 @ 0845.  First available for MD after May 1st.

## 2015-01-23 ENCOUNTER — Other Ambulatory Visit: Payer: Self-pay | Admitting: Internal Medicine

## 2015-01-31 ENCOUNTER — Other Ambulatory Visit: Payer: Self-pay | Admitting: Cardiovascular Disease

## 2015-01-31 NOTE — Telephone Encounter (Signed)
Rx refill sent to patient pharmacy   

## 2015-02-07 ENCOUNTER — Telehealth: Payer: Self-pay | Admitting: Internal Medicine

## 2015-02-07 NOTE — Telephone Encounter (Signed)
Call to patient to confirm appointment for 02/08/15 at 2:15 home is disconnected cell mail box is full

## 2015-02-08 ENCOUNTER — Encounter: Payer: Self-pay | Admitting: Internal Medicine

## 2015-02-08 ENCOUNTER — Ambulatory Visit (INDEPENDENT_AMBULATORY_CARE_PROVIDER_SITE_OTHER): Payer: Medicare Other | Admitting: Internal Medicine

## 2015-02-08 VITALS — BP 127/69 | HR 48 | Temp 97.7°F | Wt 173.0 lb

## 2015-02-08 DIAGNOSIS — IMO0002 Reserved for concepts with insufficient information to code with codable children: Secondary | ICD-10-CM

## 2015-02-08 DIAGNOSIS — F1420 Cocaine dependence, uncomplicated: Secondary | ICD-10-CM | POA: Diagnosis not present

## 2015-02-08 DIAGNOSIS — Z21 Asymptomatic human immunodeficiency virus [HIV] infection status: Secondary | ICD-10-CM

## 2015-02-08 DIAGNOSIS — I10 Essential (primary) hypertension: Secondary | ICD-10-CM | POA: Diagnosis not present

## 2015-02-08 DIAGNOSIS — Z0001 Encounter for general adult medical examination with abnormal findings: Secondary | ICD-10-CM | POA: Diagnosis not present

## 2015-02-08 DIAGNOSIS — E1140 Type 2 diabetes mellitus with diabetic neuropathy, unspecified: Secondary | ICD-10-CM

## 2015-02-08 DIAGNOSIS — E1165 Type 2 diabetes mellitus with hyperglycemia: Secondary | ICD-10-CM

## 2015-02-08 DIAGNOSIS — Z794 Long term (current) use of insulin: Secondary | ICD-10-CM

## 2015-02-08 MED ORDER — INSULIN GLARGINE 100 UNIT/ML SOLOSTAR PEN: 27.0000 [IU] | PEN_INJECTOR | Freq: Every day | SUBCUTANEOUS | Status: DC

## 2015-02-08 MED ORDER — INSULIN GLARGINE 100 UNIT/ML SOLOSTAR PEN: 25.0000 [IU] | PEN_INJECTOR | Freq: Every day | SUBCUTANEOUS | Status: DC

## 2015-02-08 MED ORDER — DICLOFENAC SODIUM 1 % TD GEL: TRANSDERMAL | Status: DC

## 2015-02-08 NOTE — Progress Notes (Signed)
   Subjective:    Patient ID: Michael [name], male    DOB: [birth year], 64 y.o.   MRN: [id number]  HPI [name] is here for a preventative care appt and understands that acute and chronic medical issues will be addressed at another appt. The following items have been assessed and recorded in the appropriate area in the EMR.  Height, weight, BMI, and BP  Depression screen  Fall risk / safety level  Preventative care services discussion Medical and family history were reviewed and updated  Medication reconciliation  Personalized health advice, risky behaviors, and treatment advice  Please see the assessment and plan for further information.    Review of Systems  Constitutional: Negative for fever, activity change, appetite change and unexpected weight change.  HENT: Negative for congestion and hearing loss. Dental problem: cavities.   Eyes: Positive for visual disturbance (blurry vision). Negative for pain.  Respiratory: Positive for shortness of breath (progressively worsening, does not interfere w/ daily activities). Negative for cough.   Cardiovascular: Positive for chest pain (chronic) and palpitations.  Gastrointestinal: Negative for nausea, vomiting, abdominal pain and diarrhea.  Endocrine: Negative for polydipsia and polyuria.  Genitourinary: Negative for dysuria.  Musculoskeletal: Positive for back pain.  Skin: Negative for rash.  Allergic/Immunologic: Negative for environmental allergies and food allergies.  Neurological: Positive for dizziness (feels like balance off) and headaches.  Psychiatric/Behavioral: Positive for suicidal ideas (sometimes ). Negative for confusion.       Objective:   Physical Exam  Constitutional: He is oriented to person, place, and time. He appears well-developed and well-nourished.  HENT:  Head: Normocephalic.  Eyes: Conjunctivae and EOM are normal. Pupils are equal, round, and reactive to light.  Cardiovascular: Normal rate  and regular rhythm.   Pulmonary/Chest: Effort normal and breath sounds normal.  Abdominal: Soft. Bowel sounds are normal. He exhibits no distension.  Neurological: He is alert and oriented to person, place, and time.  Skin: Skin is warm.          Assessment & Plan:  Please see problem based assessment and plan.

## 2015-02-08 NOTE — Patient Instructions (Signed)
Please bring in your medications with you to your next visit.   Start taking Lantus 27 units at bedtime. You can stop taking novolog with meals since you are inconsistent with it.    STOP using cocaine. Make sure to follow up with [facility], they are a walk in clinic can help you with your mental health issues and cocaine dependence.

## 2015-02-09 DIAGNOSIS — Z0001 Encounter for general adult medical examination with abnormal findings: Secondary | ICD-10-CM | POA: Insufficient documentation

## 2015-02-09 LAB — MICROALBUMIN / CREATININE URINE RATIO
CREATININE, URINE: 121.1 mg/dL
Microalb Creat Ratio: 81.8 mg/g — ABNORMAL HIGH (ref 0.0–30.0)
Microalb, Ur: 9.9 mg/dL — ABNORMAL HIGH (ref <2.0)

## 2015-02-09 LAB — HEPATITIS C ANTIBODY: HCV Ab: NEGATIVE

## 2015-02-09 NOTE — Assessment & Plan Note (Addendum)
Pt here for preventative care appt.   - Hep C ab negative - DM: foot exam preformed today, Ur microalb/cr elevated at 81.8 (already on ACEinh but non complaint with meds), will f/u in 1 month for diabetes at which time will get HbA1c and adjust insulin as needed - colonoscopy negative in 2014, next one due in 2024 - Pt does not have 30 pack year smoking hx, will defer low dose chest CT at this time - HIV positive, last CD4 count 570, follows with [name] w/ ID. On complera regimen - LDL 79 in 2015, on a statin, can recheck lipid panel in 5 years.  - updated family hx, social hx, and performed med rec.  - NKDA

## 2015-02-09 NOTE — Assessment & Plan Note (Signed)
Pt last used cocaine the day before this clinic visit. Was also admitted to [facility] for detox and never followed up with [facility].   - given [employer] and instructed to go btwn 8-1:00pm which is reserved for hospital follow ups. Can also address depression and cocaine dependence at that time.

## 2015-02-09 NOTE — Assessment & Plan Note (Signed)
BP Readings from Last 3 Encounters:  02/08/15 127/69  12/20/14 132/89  10/19/14 121/69    Lab Results  Component Value Date   NA 141 12/15/2014   K 4.0 12/15/2014   CREATININE 1.28 12/15/2014    Assessment: Blood pressure control:   controlled Progress toward BP goal:   at goal  Comments: on lopressor, lisinopril, and imdur  Plan: Medications:  continue current medications Other plans: recheck BP at 4 week f/u for DM

## 2015-02-09 NOTE — Assessment & Plan Note (Signed)
Lab Results  Component Value Date   HGBA1C 8.2* 12/15/2014   HGBA1C 11.4* 05/01/2014   HGBA1C 7.5* 01/30/2014     Assessment: Diabetes control:  uncontrolled Progress toward A1C goal:   progressing to goal Comments: non compliant with medications, states taking multiple pills make him nauseous.   Plan: Medications:   increased evening lantus from 25 units to 27 as pt is non compliant with meal time novolog. Novolog was stopped. Cont metformin Home glucose monitoring: Frequency:  TID Timing:  w/ meals Instruction/counseling given: reminded to get eye exam, reminded to bring blood glucose meter & log to each visit, reminded to bring medications to each visit and discussed diet Other plans: foot exam done today. Urine microalbumin/cr elevated on 81.8 likely due to uncontrolled diabetes. Already on an [place]. Will continue to monitor. Also referred pt to [facility] for diabetic diet and med counseling.

## 2015-02-12 NOTE — Progress Notes (Signed)
Medicine attending: Medical history, presenting problems, physical findings, and medications, reviewed with Dr [name] on the day of the patient visit and I concur with her evaluation and management plan.

## 2015-02-21 ENCOUNTER — Telehealth: Payer: Self-pay | Admitting: Internal Medicine

## 2015-02-21 NOTE — Telephone Encounter (Signed)
Call to patient to confirm appointment for 02/22/15 at 9:30 and 10:15 lmtcb

## 2015-02-22 ENCOUNTER — Ambulatory Visit: Payer: Self-pay | Admitting: Internal Medicine

## 2015-02-22 ENCOUNTER — Encounter: Payer: Self-pay | Admitting: Internal Medicine

## 2015-02-22 ENCOUNTER — Ambulatory Visit: Payer: Self-pay | Admitting: Dietician

## 2015-02-28 NOTE — Addendum Note (Signed)
Addended by: Hulan Fray on: 02/28/2015 06:55 AM   Modules accepted: Orders

## 2015-03-02 ENCOUNTER — Telehealth: Payer: Self-pay | Admitting: Internal Medicine

## 2015-03-02 NOTE — Telephone Encounter (Signed)
Call to patient to confirm appointment for 03/03/15 at 1:30 and 2:15 lmtcb

## 2015-03-03 ENCOUNTER — Encounter: Payer: Self-pay | Admitting: Internal Medicine

## 2015-03-03 ENCOUNTER — Ambulatory Visit (INDEPENDENT_AMBULATORY_CARE_PROVIDER_SITE_OTHER): Payer: Medicare Other | Admitting: Internal Medicine

## 2015-03-03 ENCOUNTER — Ambulatory Visit (INDEPENDENT_AMBULATORY_CARE_PROVIDER_SITE_OTHER): Payer: Medicare Other | Admitting: Dietician

## 2015-03-03 VITALS — BP 126/80 | HR 90 | Temp 97.8°F | Ht 67.5 in | Wt 175.5 lb

## 2015-03-03 DIAGNOSIS — Z794 Long term (current) use of insulin: Secondary | ICD-10-CM

## 2015-03-03 DIAGNOSIS — E1140 Type 2 diabetes mellitus with diabetic neuropathy, unspecified: Secondary | ICD-10-CM | POA: Diagnosis not present

## 2015-03-03 DIAGNOSIS — I2510 Atherosclerotic heart disease of native coronary artery without angina pectoris: Secondary | ICD-10-CM

## 2015-03-03 DIAGNOSIS — I10 Essential (primary) hypertension: Secondary | ICD-10-CM | POA: Diagnosis not present

## 2015-03-03 DIAGNOSIS — E1121 Type 2 diabetes mellitus with diabetic nephropathy: Secondary | ICD-10-CM

## 2015-03-03 DIAGNOSIS — E119 Type 2 diabetes mellitus without complications: Secondary | ICD-10-CM

## 2015-03-03 DIAGNOSIS — IMO0002 Reserved for concepts with insufficient information to code with codable children: Secondary | ICD-10-CM

## 2015-03-03 DIAGNOSIS — Z79899 Other long term (current) drug therapy: Secondary | ICD-10-CM

## 2015-03-03 DIAGNOSIS — E1165 Type 2 diabetes mellitus with hyperglycemia: Secondary | ICD-10-CM | POA: Diagnosis not present

## 2015-03-03 LAB — HM DIABETES EYE EXAM

## 2015-03-03 LAB — GLUCOSE, CAPILLARY: GLUCOSE-CAPILLARY: 136 mg/dL — AB (ref 65–99)

## 2015-03-03 LAB — POCT GLYCOSYLATED HEMOGLOBIN (HGB A1C): Hemoglobin A1C: 8.4

## 2015-03-03 MED ORDER — LISINOPRIL 10 MG PO TABS: 10.0000 mg | ORAL_TABLET | Freq: Every day | ORAL | Status: DC

## 2015-03-03 MED ORDER — ASPIRIN EC 81 MG PO TBEC: 81.0000 mg | DELAYED_RELEASE_TABLET | Freq: Every day | ORAL | Status: DC

## 2015-03-03 MED ORDER — GLIPIZIDE 5 MG PO TABS: 2.5000 mg | ORAL_TABLET | Freq: Every day | ORAL | Status: DC

## 2015-03-03 MED ORDER — CLOPIDOGREL BISULFATE 75 MG PO TABS: 75.0000 mg | ORAL_TABLET | Freq: Every day | ORAL | Status: DC

## 2015-03-03 NOTE — Progress Notes (Signed)
   Subjective:    Patient ID: Michael [name], male    DOB: Apr 25, 1951, 64 y.o.   MRN: [id number]  HPI Pt is a 64 y/o male w/ PMHx of CAD, cocaine abuse, DM, HIV, and asthma who presents for DM f/u. He is currently leaving at [place] for 28 days after which he plans to apply for a LTAC. He is also requesting results from MRI of brain on 3/10 which was ordered when he presented with diplopia and slurred speech.  Please see problem list for further details.      Review of Systems  Constitutional: Negative for activity change and appetite change.  Eyes: Negative for visual disturbance.  Respiratory: Negative for shortness of breath.   Cardiovascular: Negative for chest pain.  Gastrointestinal: Negative for nausea and vomiting.  Endocrine: Negative for polydipsia and polyuria.  Psychiatric/Behavioral: Negative for dysphoric mood.       Objective:   Physical Exam  Constitutional: He appears well-developed and well-nourished.  Cardiovascular: Normal rate and regular rhythm.   Pulmonary/Chest: Effort normal and breath sounds normal.  Abdominal: Soft. Bowel sounds are normal.  Skin: Skin is warm and dry.          Assessment & Plan:  Please see problem based assessment and plan.

## 2015-03-03 NOTE — Assessment & Plan Note (Signed)
Pt at [facility] living facility where they give pt his medications. Verified with [facility] that he is only getting the medications listed on his handout which does not include asa 81 mg or plavix 75mg . He has 2 drug-eluting stents placed in the LAD artery in January 2015 when he presented with in-stent restenosis in a bare-metal stent that was placed at the time of ST segment elevation myocardial infarction in September 2014. Dr. Sallyanne Kuster saw pt in 07/2014 and recommended continuing plavix at that time.   - refilled asa 81 mg and plavix 75 mg - wrote instructions for [facility] to give pt these meds daily.

## 2015-03-03 NOTE — Patient Instructions (Signed)
Start checking your blood sugars three times a day (morning, lunch, and evening). Start taking glipizide 2.5mg  at lunch time. If you do not plan on eating lunch or dinner do not take glipizide. If your blood sugar is less than 70 stop taking glipizide and call the clinic.   Here are the results of your MRI of head on 12/15/14  FINDINGS: The visualized portion of the upper cervical spine again demonstrates prior C4-5 intervertebral fusion and moderate to severe disc degeneration at C3-4 resulting in spinal stenosis and impression on the spinal cord, incompletely evaluated.  There is no evidence of acute infarct, intracranial hemorrhage, intra-axial mass, midline shift, or extra-axial fluid collection. A 7 mm lipoma is again seen associated with the inferior aspect of the septum pellucidum near the foramen of Monro, unchanged. There is no hydrocephalus. There is mild to moderate generalized cerebral atrophy, unchanged. Patchy T2 hyperintensities in the subcortical and periventricular white matter are nonspecific but compatible with mild to moderate chronic small vessel ischemic disease. Mild chronic ischemic changes are also noted in the pons.  Orbits are unremarkable. Paranasal sinuses and mastoid air cells are clear. Major intracranial vascular flow voids are preserved. A small effusion is noted at the right atlanto-occipital articulation.  IMPRESSION: 1. No acute intracranial abnormality. 2. Mild-to-moderate chronic small vessel ischemic disease and cerebral atrophy. 3. Incidental 7 mm midline lipoma.

## 2015-03-04 NOTE — Assessment & Plan Note (Signed)
BP Readings from Last 3 Encounters:  03/03/15 126/80  02/08/15 127/69  12/20/14 132/89    Lab Results  Component Value Date   NA 141 12/15/2014   K 4.0 12/15/2014   CREATININE 1.28 12/15/2014    Assessment: Blood pressure control:  controlled Progress toward BP goal:   at goal Comments: pt staying at [facility] and brought in his medication documentation by the facility staff. He has not been taking metoprolol 50mg  daily and has been taking lisinopril 10mg  instead of 5mg  that he was instructed to take. He is still on imdur 30 mg daily. Also should not be on BB with hx of cocaine abuse.   Plan: Medications: Blood pressure is well controlled today, will discontinue metoprolol changed dose of lisinopril to 10mg . Continue Imdur.  Educational resources provided:   Self management tools provided:   Other plans: f/u in 3 months.

## 2015-03-04 NOTE — Assessment & Plan Note (Signed)
Lab Results  Component Value Date   HGBA1C 8.4 03/03/2015   HGBA1C 8.2* 12/15/2014   HGBA1C 11.4* 05/01/2014     Assessment: Diabetes control:  not controlled Progress toward A1C goal:   deteriorated  Comments: Pt on metformin 1000mg  BID and lantus 30 units qhs which are given to him and documented by his nursing facility. He is suppose to be on 27 units of lantus from his last clinic visit. On his glucometer report he has 3 low readings ranging from 76-89 during which he states he feels jittery. The staff will give him candy when he feels hypoglycemic. His evening CBGs are elevated ranging from 112- 291. He eats 3 meals a day that are heavy in starches and checks his CBGs in the am and at bedtime.   Plan: Medications:  continue current medications, added glipizide 2.5mg  before lunch for better evening control of blood sugars. Instructed to stop glipizide if his CBGs are less than 70 or if he does not plan to eat lunch or dinner.  Home glucose monitoring: Frequency:  TID Timing:  am, noon, and pm Instruction/counseling given: reminded to bring blood glucose meter & log to each visit and discussed diet Educational resources provided:   Self management tools provided:   Other plans: f/u in 3 months, written instructions given to [facility].

## 2015-03-05 ENCOUNTER — Other Ambulatory Visit: Payer: Self-pay

## 2015-03-05 ENCOUNTER — Other Ambulatory Visit (HOSPITAL_BASED_OUTPATIENT_CLINIC_OR_DEPARTMENT_OTHER): Payer: Self-pay

## 2015-03-05 ENCOUNTER — Observation Stay (HOSPITAL_BASED_OUTPATIENT_CLINIC_OR_DEPARTMENT_OTHER)
Admission: EM | Admit: 2015-03-05 | Discharge: 2015-03-06 | Payer: Medicare Other | Attending: Oncology | Admitting: Oncology

## 2015-03-05 ENCOUNTER — Encounter (HOSPITAL_BASED_OUTPATIENT_CLINIC_OR_DEPARTMENT_OTHER): Payer: Self-pay | Admitting: Emergency Medicine

## 2015-03-05 DIAGNOSIS — I10 Essential (primary) hypertension: Secondary | ICD-10-CM | POA: Diagnosis present

## 2015-03-05 DIAGNOSIS — Z21 Asymptomatic human immunodeficiency virus [HIV] infection status: Secondary | ICD-10-CM | POA: Diagnosis present

## 2015-03-05 DIAGNOSIS — N183 Chronic kidney disease, stage 3 unspecified: Secondary | ICD-10-CM | POA: Diagnosis present

## 2015-03-05 DIAGNOSIS — Z7982 Long term (current) use of aspirin: Secondary | ICD-10-CM | POA: Insufficient documentation

## 2015-03-05 DIAGNOSIS — E119 Type 2 diabetes mellitus without complications: Secondary | ICD-10-CM | POA: Diagnosis not present

## 2015-03-05 DIAGNOSIS — R05 Cough: Secondary | ICD-10-CM | POA: Diagnosis not present

## 2015-03-05 DIAGNOSIS — E785 Hyperlipidemia, unspecified: Secondary | ICD-10-CM | POA: Diagnosis not present

## 2015-03-05 DIAGNOSIS — I252 Old myocardial infarction: Secondary | ICD-10-CM | POA: Diagnosis not present

## 2015-03-05 DIAGNOSIS — E162 Hypoglycemia, unspecified: Secondary | ICD-10-CM

## 2015-03-05 DIAGNOSIS — I2510 Atherosclerotic heart disease of native coronary artery without angina pectoris: Secondary | ICD-10-CM | POA: Diagnosis present

## 2015-03-05 DIAGNOSIS — F1924 Other psychoactive substance dependence with psychoactive substance-induced mood disorder: Secondary | ICD-10-CM

## 2015-03-05 DIAGNOSIS — Z9861 Coronary angioplasty status: Secondary | ICD-10-CM | POA: Diagnosis not present

## 2015-03-05 DIAGNOSIS — I129 Hypertensive chronic kidney disease with stage 1 through stage 4 chronic kidney disease, or unspecified chronic kidney disease: Secondary | ICD-10-CM | POA: Insufficient documentation

## 2015-03-05 DIAGNOSIS — E1140 Type 2 diabetes mellitus with diabetic neuropathy, unspecified: Secondary | ICD-10-CM | POA: Diagnosis not present

## 2015-03-05 DIAGNOSIS — Z809 Family history of malignant neoplasm, unspecified: Secondary | ICD-10-CM | POA: Insufficient documentation

## 2015-03-05 DIAGNOSIS — J45909 Unspecified asthma, uncomplicated: Secondary | ICD-10-CM | POA: Diagnosis not present

## 2015-03-05 DIAGNOSIS — M109 Gout, unspecified: Secondary | ICD-10-CM | POA: Insufficient documentation

## 2015-03-05 DIAGNOSIS — R531 Weakness: Secondary | ICD-10-CM | POA: Diagnosis not present

## 2015-03-05 DIAGNOSIS — B20 Human immunodeficiency virus [HIV] disease: Secondary | ICD-10-CM | POA: Insufficient documentation

## 2015-03-05 DIAGNOSIS — E1165 Type 2 diabetes mellitus with hyperglycemia: Secondary | ICD-10-CM | POA: Insufficient documentation

## 2015-03-05 DIAGNOSIS — Z7902 Long term (current) use of antithrombotics/antiplatelets: Secondary | ICD-10-CM | POA: Diagnosis not present

## 2015-03-05 DIAGNOSIS — IMO0002 Reserved for concepts with insufficient information to code with codable children: Secondary | ICD-10-CM | POA: Diagnosis present

## 2015-03-05 DIAGNOSIS — F329 Major depressive disorder, single episode, unspecified: Secondary | ICD-10-CM | POA: Diagnosis not present

## 2015-03-05 DIAGNOSIS — Z794 Long term (current) use of insulin: Secondary | ICD-10-CM | POA: Diagnosis not present

## 2015-03-05 DIAGNOSIS — Z955 Presence of coronary angioplasty implant and graft: Secondary | ICD-10-CM | POA: Diagnosis not present

## 2015-03-05 DIAGNOSIS — F19239 Other psychoactive substance dependence with withdrawal, unspecified: Secondary | ICD-10-CM | POA: Diagnosis present

## 2015-03-05 DIAGNOSIS — D649 Anemia, unspecified: Secondary | ICD-10-CM | POA: Diagnosis not present

## 2015-03-05 DIAGNOSIS — Z872 Personal history of diseases of the skin and subcutaneous tissue: Secondary | ICD-10-CM | POA: Diagnosis not present

## 2015-03-05 DIAGNOSIS — R079 Chest pain, unspecified: Secondary | ICD-10-CM | POA: Diagnosis not present

## 2015-03-05 DIAGNOSIS — Z79899 Other long term (current) drug therapy: Secondary | ICD-10-CM | POA: Diagnosis not present

## 2015-03-05 DIAGNOSIS — Z791 Long term (current) use of non-steroidal anti-inflammatories (NSAID): Secondary | ICD-10-CM | POA: Diagnosis not present

## 2015-03-05 DIAGNOSIS — E11649 Type 2 diabetes mellitus with hypoglycemia without coma: Secondary | ICD-10-CM | POA: Diagnosis not present

## 2015-03-05 DIAGNOSIS — N179 Acute kidney failure, unspecified: Secondary | ICD-10-CM | POA: Diagnosis present

## 2015-03-05 DIAGNOSIS — F1420 Cocaine dependence, uncomplicated: Secondary | ICD-10-CM | POA: Diagnosis not present

## 2015-03-05 DIAGNOSIS — E872 Acidosis, unspecified: Secondary | ICD-10-CM | POA: Diagnosis present

## 2015-03-05 DIAGNOSIS — R0602 Shortness of breath: Secondary | ICD-10-CM | POA: Diagnosis not present

## 2015-03-05 DIAGNOSIS — F3289 Other specified depressive episodes: Secondary | ICD-10-CM

## 2015-03-05 DIAGNOSIS — R55 Syncope and collapse: Secondary | ICD-10-CM | POA: Diagnosis not present

## 2015-03-05 LAB — BASIC METABOLIC PANEL
Anion gap: 11 (ref 5–15)
BUN: 25 mg/dL — ABNORMAL HIGH (ref 6–20)
CALCIUM: 9.7 mg/dL (ref 8.9–10.3)
CO2: 25 mmol/L (ref 22–32)
CREATININE: 1.44 mg/dL — AB (ref 0.61–1.24)
Chloride: 103 mmol/L (ref 101–111)
GFR calc Af Amer: 58 mL/min — ABNORMAL LOW (ref >60)
GFR calc non Af Amer: 50 mL/min — ABNORMAL LOW (ref >60)
GLUCOSE: 143 mg/dL — AB (ref 65–99)
Potassium: 4.8 mmol/L (ref 3.5–5.1)
Sodium: 139 mmol/L (ref 135–145)

## 2015-03-05 LAB — CBC
HEMATOCRIT: 38.7 % — AB (ref 39.0–52.0)
Hemoglobin: 12.8 g/dL — ABNORMAL LOW (ref 13.0–17.0)
MCH: 27.7 pg (ref 26.0–34.0)
MCHC: 33.1 g/dL (ref 30.0–36.0)
MCV: 83.8 fL (ref 78.0–100.0)
Platelets: 190 10*3/uL (ref 150–400)
RBC: 4.62 MIL/uL (ref 4.22–5.81)
RDW: 13 % (ref 11.5–15.5)
WBC: 4.3 10*3/uL (ref 4.0–10.5)

## 2015-03-05 LAB — I-STAT CG4 LACTIC ACID, ED
Lactic Acid, Venous: 3.7 mmol/L (ref 0.5–2.0)
Lactic Acid, Venous: 3.74 mmol/L (ref 0.5–2.0)

## 2015-03-05 LAB — TROPONIN I: Troponin I: <0.03 ng/mL (ref <0.031)

## 2015-03-05 LAB — CBG MONITORING, ED
GLUCOSE-CAPILLARY: 111 mg/dL — AB (ref 65–99)
GLUCOSE-CAPILLARY: 147 mg/dL — AB (ref 65–99)

## 2015-03-05 MED ORDER — SODIUM CHLORIDE 0.9 % IV BOLUS (SEPSIS)
1000.0000 mL | Freq: Once | INTRAVENOUS | Status: AC
  Administered 2015-03-06: 1000 mL via INTRAVENOUS

## 2015-03-05 MED ORDER — SODIUM CHLORIDE 0.9 % IV BOLUS (SEPSIS)
500.0000 mL | Freq: Once | INTRAVENOUS | Status: AC
  Administered 2015-03-06: 500 mL via INTRAVENOUS

## 2015-03-05 NOTE — ED Notes (Signed)
Pt currently being treated at [facility] (reports being in treatment for 17 days now). Reports 1000mg  Metformin at 1200 and 1600. Pt ate dinner, then felt as if blood sugar was dropping. CBG was 56 at that time; pt drank some juice and ate peanut butter sandwich; CBG came up to 72, but dropped again to 50. Pt then came to ED. Reports feeling shaky. Dr. Mingo Amber at bedside; pt drinking juice and eating peanut butter crackers. Connye Burkitt, RN

## 2015-03-05 NOTE — ED Notes (Signed)
Pt in c/o chills and states his sugar dropped and he was not able to raise it with orange juice.

## 2015-03-05 NOTE — ED Notes (Addendum)
CBG 64. Pt provided with OJ and drinking at this time.

## 2015-03-05 NOTE — ED Notes (Signed)
I took CBG and got 64 mg./dcltr.

## 2015-03-05 NOTE — ED Provider Notes (Signed)
CSN: [id number]     Arrival date & time 03/05/15  2140 History  This chart was scribed for Michael Bucy, MD by Stephania Fragmin, ED Scribe. This patient was seen in room MH09/MH09 and the patient's care was started at 10:21 PM.   Chief Complaint  Patient presents with  . Hypoglycemia   Patient is a 64 y.o. male presenting with hypoglycemia. The history is provided by the patient. No language interpreter was used.  Hypoglycemia Initial blood sugar:  54 Severity:  Moderate Onset quality:  Sudden Duration:  4 hours Timing:  Constant Progression:  Worsening Chronicity:  New Diabetic status:  Controlled with insulin and controlled with oral medications Current diabetic therapy:  Lantus, metformin Time since last antidiabetic medication:  5 hours Context comment:  Medication changes Relieved by:  Nothing Ineffective treatments:  Eating Associated symptoms: dizziness, shortness of breath and tremors   Dizziness:    Severity:  Moderate   Duration:  4 hours   Timing:  Constant   Progression:  Worsening    HPI Comments: IHAB WOLTZ is a 64 y.o. male with a history of DM, who presents to the Emergency Department complaining of feeling dizzy and shaky. He endorses SOB. Patient checked his sugar this evening after dinner as he felt his blood sugar dropping, and got a reading of 56. He then drank orange juice and ate a peanut butter sandwich, and it was again 50. This has never happened before. He states he took metformin 1000 at noon and then again at 5 PM. Patient saw his PCP, Dr. [name], yesterday, and she dropped his Lantus from 30 units to 27 units because his sugar looked good. Patient is currently at [facility] for a recent relapse from crack cocaine. He denies chest pain.   Past Medical History  Diagnosis Date  . Diabetes mellitus     Diagnosed in 2002, started insulin in 2012  . Hypertension   . Headache(784.0)     CT head 08/2011: Periventricular and subcortical white matter  hypodensities are most in keeping with chronic microangiopathic change  . Gout   . Hyperlipidemia   . HIV infection Nov 2012    Followed by [name]  . Crack cocaine use     for 20+ years, has been enrolled in detox programs in the past  . Chondromalacia of medial femoral condyle     Left knee MRI 04/28/12: Chondromalacia of the medial femoral condyle with slight peripheral degeneration of the meniscocapsular junction of the medial meniscus; followed by sports medicine  . Asthma     No PFTs, history of childhood asthma  . Depression     with history of hospitalization for suicidal ideation  . CAD (coronary artery disease)     a. 06/2013 STEMI/PCI ([facility]): LAD w/ thrombus (treated with BMS), mid 75%, D2 75%; LCX OM2 75%; RCA small, PDA 95%, PLV 95%;  b. 10/2013 Cath/PCI: ISR w/in LAD (Promus DES x 2), borderline OM2 lesion;  c. 01/2014 MV: Intermediate risk, medium-sized distal ant wall infarct w/ very small amt of peri-infarct ischemia. EF 60%.  . Gout 04/28/2012  . Collagen vascular disease   . Cellulitis 04/2014    left facial   Past Surgical History  Procedure Laterality Date  . Bowel resection    . Back surgery      1988  . Cardiac surgery    . Cervical spine surgery      " rods in my neck "  . Coronary stent  placement    . Nm myocar perf wall motion  12/27/2011    normal  . Spine surgery     Family History  Problem Relation Age of Onset  . Diabetes Mother   . Hypertension Mother   . Hyperlipidemia Mother   . Diabetes Father   . Cancer Father   . Hypertension Father   . Diabetes Brother   . Heart disease Brother   . Colon cancer Neg Hx   . Diabetes Sister    History  Substance Use Topics  . Smoking status: Never Smoker   . Smokeless tobacco: Never Used  . Alcohol Use: No    Review of Systems  Respiratory: Positive for shortness of breath.   Cardiovascular: Negative for chest pain.  Neurological: Positive for dizziness and tremors.  All other systems reviewed and  are negative.     Allergies  Review of patient's allergies indicates no known allergies.  Home Medications   Prior to Admission medications   Medication Sig Start Date End Date Taking? Authorizing Provider  albuterol (PROVENTIL HFA;VENTOLIN HFA) 108 (90 BASE) MCG/ACT inhaler Inhale 2 puffs into the lungs every 6 (six) hours as needed for wheezing or shortness of breath. 12/20/14   Encarnacion Slates, NP  allopurinol (ZYLOPRIM) 100 MG tablet Take 2 tablets (200 mg total) by mouth daily. For gouty arthritis 12/20/14   Encarnacion Slates, NP  aspirin EC 81 MG tablet Take 1 tablet (81 mg total) by mouth daily. For heart health 03/03/15   Norman Herrlich, MD  clopidogrel (PLAVIX) 75 MG tablet Take 1 tablet (75 mg total) by mouth daily. For prevention of stroke 03/03/15   Norman Herrlich, MD  COMPLERA 200-25-300 MG TABS TAKE 1 TABLET BY MOUTH DAILY. 01/18/15   [name], MD  diclofenac sodium (VOLTAREN) 1 % GEL APPLY TWO   GMS TOPICALLY TWO   TIMES DAILY AS NEEDED (PAIN). 02/08/15   Norman Herrlich, MD  DULoxetine (CYMBALTA) 60 MG capsule TAKE 1 CAPSULE   BY MOUTH   DAILY 12/26/14   Norman Herrlich, MD  glipiZIDE (GLUCOTROL) 5 MG tablet Take 0.5 tablets (2.5 mg total) by mouth daily before lunch. 03/03/15   Norman Herrlich, MD  Insulin Glargine (LANTUS SOLOSTAR) 100 UNIT/ML Solostar Pen Inject 27 Units into the skin at bedtime. 02/08/15   Norman Herrlich, MD  ipratropium-albuterol (DUONEB) 0.5-2.5 (3) MG/3ML SOLN Take 3 mLs by nebulization every 4 (four) hours as needed (shortness of breath). 12/20/14   Encarnacion Slates, NP  isosorbide mononitrate (IMDUR) 30 MG 24 hr tablet Take 1 tablet (30 mg total) by mouth daily. For chest pain 12/20/14   Encarnacion Slates, NP  lisinopril (PRINIVIL,ZESTRIL) 10 MG tablet Take 1 tablet (10 mg total) by mouth daily. For high blood pressure 03/03/15   Norman Herrlich, MD  metFORMIN (GLUCOPHAGE) 1000 MG tablet Take 1 tablet (1,000 mg total) by mouth 2 (two) times daily with a meal. For diabetes  management 12/20/14   Encarnacion Slates, NP  pravastatin (PRAVACHOL) 40 MG tablet Take 1 tablet (40 mg total) by mouth daily. For high cholesterol 12/20/14   Encarnacion Slates, NP  QUEtiapine (SEROQUEL) 50 MG tablet Take 1 tablet (50 mg total) by mouth at bedtime. For mood control 12/20/14   Encarnacion Slates, NP   BP 138/85 mmHg  Pulse 103  Temp(Src) 97.5 F (36.4 C) (Oral)  Resp 20  Ht 5' 7.5" (1.715 m)  Wt 176  lb (79.833 kg)  BMI 27.14 kg/m2  SpO2 99% Physical Exam  Constitutional: He is oriented to person, place, and time. He appears well-developed and well-nourished. No distress.  HENT:  Head: Normocephalic and atraumatic.  Mouth/Throat: No oropharyngeal exudate.  Eyes: EOM are normal. Pupils are equal, round, and reactive to light.  Neck: Normal range of motion. Neck supple.  Cardiovascular: Normal rate and regular rhythm.  Exam reveals no friction rub.   No murmur heard. Pulmonary/Chest: Effort normal and breath sounds normal. No respiratory distress. He has no wheezes. He has no rales.  Abdominal: He exhibits no distension. There is no tenderness. There is no rebound.  Musculoskeletal: Normal range of motion. He exhibits no edema.  Neurological: He is alert and oriented to person, place, and time.  Skin: He is not diaphoretic.  Nursing note and vitals reviewed.   ED Course  Procedures (including critical care time)  DIAGNOSTIC STUDIES: Oxygen Saturation is 99% on RA, normal by my interpretation.    COORDINATION OF CARE: 10:24 PM - Discussed treatment plan with pt at bedside which includes eating, and pt agreed to plan.   Labs Review Labs Reviewed  CBC - Abnormal; Notable for the following:    Hemoglobin 12.8 (*)    HCT 38.7 (*)    All other components within normal limits  BASIC METABOLIC PANEL - Abnormal; Notable for the following:    Glucose, Bld 143 (*)    BUN 25 (*)    Creatinine, Ser 1.44 (*)    GFR calc non Af Amer 50 (*)    GFR calc Af Amer 58 (*)    All other  components within normal limits  CBG MONITORING, ED - Abnormal; Notable for the following:    Glucose-Capillary 111 (*)    All other components within normal limits  CBG MONITORING, ED - Abnormal; Notable for the following:    Glucose-Capillary 147 (*)    All other components within normal limits  I-STAT CG4 LACTIC ACID, ED - Abnormal; Notable for the following:    Lactic Acid, Venous 3.70 (*)    All other components within normal limits  CBG MONITORING, ED - Abnormal; Notable for the following:    Glucose-Capillary 183 (*)    All other components within normal limits  I-STAT CG4 LACTIC ACID, ED - Abnormal; Notable for the following:    Lactic Acid, Venous 3.74 (*)    All other components within normal limits  TROPONIN I  URINALYSIS, ROUTINE W REFLEX MICROSCOPIC (NOT AT [facility])    Imaging Review No results found.   EKG Interpretation None     EKG: normal EKG, normal sinus rhythm, sinus arrhythmia, rate 82. No prior.  MDM   Final diagnoses:  Hypoglycemia  Lactic acidosis    64 year old man here hypoglycemia. He is currently in treatment for crack cocaine abuse. He's been getting his metformin twice a day, but today guided at 12 PM and 5 PM,12 hours apart. Sugars were low in the 50s despite food and drink. Here is doing well, relaxing comfortably. All sugars have been triple digit. Labs otherwise okay. EKG without concerning factors. Fluids given. He states he's feeling better. Lactate normal.  His vitals have remained normal and his blood sugars are ok. Lactate checked since he took too much metformin, it is elevated at 3.7. Hydrated, CXR and urine sent. Will plan for admission since his lactate is fairly elevated, he has hx of HIV, and is on metformin and a glipizide. He could continue  to trend downwards with his blood sugars.  Admitted to internal medicine at [facility].    I personally performed the services described in this documentation, which was scribed in my  presence. The recorded information has been reviewed and is accurate.      Michael Bucy, MD 03/06/15 [phone]

## 2015-03-06 ENCOUNTER — Emergency Department (HOSPITAL_COMMUNITY): Payer: Medicare Other

## 2015-03-06 ENCOUNTER — Emergency Department (HOSPITAL_BASED_OUTPATIENT_CLINIC_OR_DEPARTMENT_OTHER): Payer: Medicare Other

## 2015-03-06 ENCOUNTER — Emergency Department (HOSPITAL_COMMUNITY)
Admission: EM | Admit: 2015-03-06 | Discharge: 2015-03-06 | Disposition: A | Discharge status: Home / Self Care | Payer: Medicare Other | Attending: Emergency Medicine | Admitting: Emergency Medicine

## 2015-03-06 ENCOUNTER — Encounter (HOSPITAL_COMMUNITY): Payer: Self-pay | Admitting: Emergency Medicine

## 2015-03-06 ENCOUNTER — Other Ambulatory Visit: Payer: Self-pay

## 2015-03-06 DIAGNOSIS — Z955 Presence of coronary angioplasty implant and graft: Secondary | ICD-10-CM

## 2015-03-06 DIAGNOSIS — N183 Chronic kidney disease, stage 3 unspecified: Secondary | ICD-10-CM | POA: Diagnosis present

## 2015-03-06 DIAGNOSIS — M109 Gout, unspecified: Secondary | ICD-10-CM | POA: Diagnosis not present

## 2015-03-06 DIAGNOSIS — J45909 Unspecified asthma, uncomplicated: Secondary | ICD-10-CM

## 2015-03-06 DIAGNOSIS — Z7951 Long term (current) use of inhaled steroids: Secondary | ICD-10-CM

## 2015-03-06 DIAGNOSIS — Z21 Asymptomatic human immunodeficiency virus [HIV] infection status: Secondary | ICD-10-CM

## 2015-03-06 DIAGNOSIS — Z872 Personal history of diseases of the skin and subcutaneous tissue: Secondary | ICD-10-CM | POA: Insufficient documentation

## 2015-03-06 DIAGNOSIS — E119 Type 2 diabetes mellitus without complications: Secondary | ICD-10-CM | POA: Insufficient documentation

## 2015-03-06 DIAGNOSIS — Z79899 Other long term (current) drug therapy: Secondary | ICD-10-CM | POA: Insufficient documentation

## 2015-03-06 DIAGNOSIS — E1165 Type 2 diabetes mellitus with hyperglycemia: Secondary | ICD-10-CM | POA: Diagnosis not present

## 2015-03-06 DIAGNOSIS — I10 Essential (primary) hypertension: Secondary | ICD-10-CM | POA: Diagnosis not present

## 2015-03-06 DIAGNOSIS — R531 Weakness: Secondary | ICD-10-CM | POA: Diagnosis not present

## 2015-03-06 DIAGNOSIS — F329 Major depressive disorder, single episode, unspecified: Secondary | ICD-10-CM | POA: Insufficient documentation

## 2015-03-06 DIAGNOSIS — E11649 Type 2 diabetes mellitus with hypoglycemia without coma: Secondary | ICD-10-CM

## 2015-03-06 DIAGNOSIS — Z7902 Long term (current) use of antithrombotics/antiplatelets: Secondary | ICD-10-CM | POA: Insufficient documentation

## 2015-03-06 DIAGNOSIS — Z9861 Coronary angioplasty status: Secondary | ICD-10-CM | POA: Diagnosis not present

## 2015-03-06 DIAGNOSIS — Z7982 Long term (current) use of aspirin: Secondary | ICD-10-CM | POA: Insufficient documentation

## 2015-03-06 DIAGNOSIS — E785 Hyperlipidemia, unspecified: Secondary | ICD-10-CM

## 2015-03-06 DIAGNOSIS — R55 Syncope and collapse: Secondary | ICD-10-CM | POA: Insufficient documentation

## 2015-03-06 DIAGNOSIS — N179 Acute kidney failure, unspecified: Secondary | ICD-10-CM | POA: Diagnosis present

## 2015-03-06 DIAGNOSIS — E1122 Type 2 diabetes mellitus with diabetic chronic kidney disease: Secondary | ICD-10-CM | POA: Diagnosis not present

## 2015-03-06 DIAGNOSIS — I2510 Atherosclerotic heart disease of native coronary artery without angina pectoris: Secondary | ICD-10-CM | POA: Insufficient documentation

## 2015-03-06 DIAGNOSIS — Z794 Long term (current) use of insulin: Secondary | ICD-10-CM | POA: Insufficient documentation

## 2015-03-06 DIAGNOSIS — R05 Cough: Secondary | ICD-10-CM | POA: Diagnosis not present

## 2015-03-06 DIAGNOSIS — Z791 Long term (current) use of non-steroidal anti-inflammatories (NSAID): Secondary | ICD-10-CM | POA: Diagnosis not present

## 2015-03-06 DIAGNOSIS — B20 Human immunodeficiency virus [HIV] disease: Secondary | ICD-10-CM | POA: Diagnosis not present

## 2015-03-06 DIAGNOSIS — R079 Chest pain, unspecified: Secondary | ICD-10-CM | POA: Diagnosis not present

## 2015-03-06 DIAGNOSIS — I129 Hypertensive chronic kidney disease with stage 1 through stage 4 chronic kidney disease, or unspecified chronic kidney disease: Secondary | ICD-10-CM | POA: Diagnosis not present

## 2015-03-06 DIAGNOSIS — I252 Old myocardial infarction: Secondary | ICD-10-CM

## 2015-03-06 DIAGNOSIS — E162 Hypoglycemia, unspecified: Secondary | ICD-10-CM

## 2015-03-06 DIAGNOSIS — D649 Anemia, unspecified: Secondary | ICD-10-CM | POA: Diagnosis present

## 2015-03-06 DIAGNOSIS — R0602 Shortness of breath: Secondary | ICD-10-CM | POA: Insufficient documentation

## 2015-03-06 DIAGNOSIS — E1140 Type 2 diabetes mellitus with diabetic neuropathy, unspecified: Secondary | ICD-10-CM | POA: Diagnosis not present

## 2015-03-06 DIAGNOSIS — E872 Acidosis, unspecified: Secondary | ICD-10-CM | POA: Diagnosis present

## 2015-03-06 DIAGNOSIS — R404 Transient alteration of awareness: Secondary | ICD-10-CM | POA: Diagnosis not present

## 2015-03-06 LAB — GLUCOSE, CAPILLARY
GLUCOSE-CAPILLARY: 158 mg/dL — AB (ref 65–99)
Glucose-Capillary: 153 mg/dL — ABNORMAL HIGH (ref 65–99)
Glucose-Capillary: 154 mg/dL — ABNORMAL HIGH (ref 65–99)

## 2015-03-06 LAB — RAPID URINE DRUG SCREEN, HOSP PERFORMED
AMPHETAMINES: NOT DETECTED
Amphetamines: NOT DETECTED
BARBITURATES: NOT DETECTED
BENZODIAZEPINES: NOT DETECTED
Barbiturates: NOT DETECTED
Benzodiazepines: NOT DETECTED
Cocaine: NOT DETECTED
Cocaine: NOT DETECTED
OPIATES: NOT DETECTED
Opiates: NOT DETECTED
Tetrahydrocannabinol: NOT DETECTED
Tetrahydrocannabinol: NOT DETECTED

## 2015-03-06 LAB — COMPREHENSIVE METABOLIC PANEL
ALBUMIN: 3.3 g/dL — AB (ref 3.5–5.0)
ALK PHOS: 57 U/L (ref 38–126)
ALT: 15 U/L — ABNORMAL LOW (ref 17–63)
ALT: 16 U/L — ABNORMAL LOW (ref 17–63)
AST: 22 U/L (ref 15–41)
AST: 23 U/L (ref 15–41)
Albumin: 3.6 g/dL (ref 3.5–5.0)
Alkaline Phosphatase: 61 U/L (ref 38–126)
Anion gap: 6 (ref 5–15)
Anion gap: 3 — ABNORMAL LOW (ref 5–15)
BUN: 20 mg/dL (ref 6–20)
BUN: 14 mg/dL (ref 6–20)
CALCIUM: 8.6 mg/dL — AB (ref 8.9–10.3)
CO2: 23 mmol/L (ref 22–32)
CO2: 26 mmol/L (ref 22–32)
CREATININE: 1.27 mg/dL — AB (ref 0.61–1.24)
CREATININE: 1.26 mg/dL — AB (ref 0.61–1.24)
Calcium: 8.5 mg/dL — ABNORMAL LOW (ref 8.9–10.3)
Chloride: 110 mmol/L (ref 101–111)
Chloride: 107 mmol/L (ref 101–111)
GFR calc Af Amer: >60 mL/min (ref >60)
GFR calc Af Amer: >60 mL/min (ref >60)
GFR calc non Af Amer: 59 mL/min — ABNORMAL LOW (ref >60)
GFR, EST NON AFRICAN AMERICAN: 58 mL/min — AB (ref >60)
Glucose, Bld: 155 mg/dL — ABNORMAL HIGH (ref 65–99)
Glucose, Bld: 247 mg/dL — ABNORMAL HIGH (ref 65–99)
Potassium: 4.2 mmol/L (ref 3.5–5.1)
Potassium: 4.1 mmol/L (ref 3.5–5.1)
SODIUM: 139 mmol/L (ref 135–145)
Sodium: 136 mmol/L (ref 135–145)
TOTAL PROTEIN: 6.4 g/dL — AB (ref 6.5–8.1)
Total Bilirubin: 0.4 mg/dL (ref 0.3–1.2)
Total Bilirubin: 0.6 mg/dL (ref 0.3–1.2)
Total Protein: 6.7 g/dL (ref 6.5–8.1)

## 2015-03-06 LAB — URINALYSIS, ROUTINE W REFLEX MICROSCOPIC
BILIRUBIN URINE: NEGATIVE
Bilirubin Urine: NEGATIVE
GLUCOSE, UA: 500 mg/dL — AB
Glucose, UA: 100 mg/dL — AB
HGB URINE DIPSTICK: NEGATIVE
Hgb urine dipstick: NEGATIVE
KETONES UR: 15 mg/dL — AB
Ketones, ur: NEGATIVE mg/dL
LEUKOCYTES UA: NEGATIVE
Leukocytes, UA: NEGATIVE
Nitrite: NEGATIVE
Nitrite: NEGATIVE
PH: 5.5 (ref 5.0–8.0)
PH: 6 (ref 5.0–8.0)
Protein, ur: NEGATIVE mg/dL
Protein, ur: NEGATIVE mg/dL
Specific Gravity, Urine: 1.015 (ref 1.005–1.030)
Specific Gravity, Urine: 1.013 (ref 1.005–1.030)
Urobilinogen, UA: 0.2 mg/dL (ref 0.0–1.0)
Urobilinogen, UA: 0.2 mg/dL (ref 0.0–1.0)

## 2015-03-06 LAB — CBC WITH DIFFERENTIAL/PLATELET
Basophils Absolute: 0 10*3/uL (ref 0.0–0.1)
Basophils Relative: 0 % (ref 0–1)
EOS PCT: 1 % (ref 0–5)
Eosinophils Absolute: 0 10*3/uL (ref 0.0–0.7)
HCT: 33.7 % — ABNORMAL LOW (ref 39.0–52.0)
Hemoglobin: 11.1 g/dL — ABNORMAL LOW (ref 13.0–17.0)
Lymphocytes Relative: 30 % (ref 12–46)
Lymphs Abs: 0.8 10*3/uL (ref 0.7–4.0)
MCH: 27.1 pg (ref 26.0–34.0)
MCHC: 32.9 g/dL (ref 30.0–36.0)
MCV: 82.2 fL (ref 78.0–100.0)
MONO ABS: 0.2 10*3/uL (ref 0.1–1.0)
Monocytes Relative: 8 % (ref 3–12)
NEUTROS ABS: 1.7 10*3/uL (ref 1.7–7.7)
Neutrophils Relative %: 61 % (ref 43–77)
Platelets: 146 10*3/uL — ABNORMAL LOW (ref 150–400)
RBC: 4.1 MIL/uL — ABNORMAL LOW (ref 4.22–5.81)
RDW: 12.8 % (ref 11.5–15.5)
WBC: 2.8 10*3/uL — ABNORMAL LOW (ref 4.0–10.5)

## 2015-03-06 LAB — TROPONIN I
Troponin I: <0.03 ng/mL (ref <0.031)
Troponin I: <0.03 ng/mL (ref <0.031)

## 2015-03-06 LAB — LACTIC ACID, PLASMA: LACTIC ACID, VENOUS: 1.9 mmol/L (ref 0.5–2.0)

## 2015-03-06 LAB — CBG MONITORING, ED: Glucose-Capillary: 183 mg/dL — ABNORMAL HIGH (ref 65–99)

## 2015-03-06 LAB — MAGNESIUM: Magnesium: 1.7 mg/dL (ref 1.7–2.4)

## 2015-03-06 LAB — CK: Total CK: 172 U/L (ref 49–397)

## 2015-03-06 LAB — I-STAT CG4 LACTIC ACID, ED: Lactic Acid, Venous: 2.25 mmol/L (ref 0.5–2.0)

## 2015-03-06 LAB — BRAIN NATRIURETIC PEPTIDE: B NATRIURETIC PEPTIDE 5: 78.3 pg/mL (ref 0.0–100.0)

## 2015-03-06 MED ORDER — ISOSORBIDE MONONITRATE ER 30 MG PO TB24
30.0000 mg | ORAL_TABLET | Freq: Every day | ORAL | Status: DC
  Administered 2015-03-06: 30 mg via ORAL
  Filled 2015-03-06: qty 1

## 2015-03-06 MED ORDER — IPRATROPIUM-ALBUTEROL 0.5-2.5 (3) MG/3ML IN SOLN: 3.0000 mL | RESPIRATORY_TRACT | Status: DC | PRN

## 2015-03-06 MED ORDER — SODIUM CHLORIDE 0.9 % IV SOLN: INTRAVENOUS | Status: DC

## 2015-03-06 MED ORDER — IOHEXOL 350 MG/ML SOLN
100.0000 mL | Freq: Once | INTRAVENOUS | Status: AC | PRN
  Administered 2015-03-06: 100 mL via INTRAVENOUS

## 2015-03-06 MED ORDER — SODIUM CHLORIDE 0.9 % IV SOLN
Freq: Once | INTRAVENOUS | Status: AC
  Administered 2015-03-06: 01:00:00 via INTRAVENOUS

## 2015-03-06 MED ORDER — ACETAMINOPHEN 325 MG PO TABS: 650.0000 mg | ORAL_TABLET | Freq: Four times a day (QID) | ORAL | Status: DC | PRN

## 2015-03-06 MED ORDER — EMTRICITABINE-TENOFOVIR DF 200-300 MG PO TABS
1.0000 | ORAL_TABLET | Freq: Every day | ORAL | Status: DC
  Administered 2015-03-06: 1 via ORAL
  Filled 2015-03-06: qty 1

## 2015-03-06 MED ORDER — ALBUTEROL SULFATE (2.5 MG/3ML) 0.083% IN NEBU: 2.5000 mg | INHALATION_SOLUTION | Freq: Four times a day (QID) | RESPIRATORY_TRACT | Status: DC | PRN

## 2015-03-06 MED ORDER — INSULIN GLARGINE 100 UNIT/ML ~~LOC~~ SOLN
15.0000 [IU] | Freq: Every day | SUBCUTANEOUS | Status: DC
Start: 2015-03-06 — End: 2015-03-06
  Administered 2015-03-06: 15 [IU] via SUBCUTANEOUS
  Filled 2015-03-06: qty 0.15

## 2015-03-06 MED ORDER — SODIUM CHLORIDE 0.9 % IJ SOLN: 3.0000 mL | Freq: Two times a day (BID) | INTRAMUSCULAR | Status: DC

## 2015-03-06 MED ORDER — PRAVASTATIN SODIUM 10 MG PO TABS
40.0000 mg | ORAL_TABLET | Freq: Every day | ORAL | Status: DC
  Administered 2015-03-06: 40 mg via ORAL
  Filled 2015-03-06: qty 4

## 2015-03-06 MED ORDER — RILPIVIRINE HCL 25 MG PO TABS
25.0000 mg | ORAL_TABLET | Freq: Every day | ORAL | Status: DC
  Administered 2015-03-06: 25 mg via ORAL
  Filled 2015-03-06: qty 1

## 2015-03-06 MED ORDER — ASPIRIN EC 81 MG PO TBEC
81.0000 mg | DELAYED_RELEASE_TABLET | Freq: Every day | ORAL | Status: DC
  Administered 2015-03-06: 81 mg via ORAL
  Filled 2015-03-06: qty 1

## 2015-03-06 MED ORDER — DULOXETINE HCL 60 MG PO CPEP
60.0000 mg | ORAL_CAPSULE | Freq: Every day | ORAL | Status: DC
  Administered 2015-03-06: 60 mg via ORAL
  Filled 2015-03-06: qty 1

## 2015-03-06 MED ORDER — HEPARIN SODIUM (PORCINE) 5000 UNIT/ML IJ SOLN: 5000.0000 [IU] | Freq: Three times a day (TID) | INTRAMUSCULAR | Status: DC

## 2015-03-06 MED ORDER — ALLOPURINOL 100 MG PO TABS
200.0000 mg | ORAL_TABLET | Freq: Every day | ORAL | Status: DC
  Administered 2015-03-06: 200 mg via ORAL
  Filled 2015-03-06: qty 2

## 2015-03-06 MED ORDER — QUETIAPINE FUMARATE 50 MG PO TABS: 50.0000 mg | ORAL_TABLET | Freq: Every day | ORAL | Status: DC

## 2015-03-06 MED ORDER — CLOPIDOGREL BISULFATE 75 MG PO TABS
75.0000 mg | ORAL_TABLET | Freq: Every day | ORAL | Status: DC
  Administered 2015-03-06: 75 mg via ORAL
  Filled 2015-03-06: qty 1

## 2015-03-06 MED ORDER — ACETAMINOPHEN 650 MG RE SUPP: 650.0000 mg | Freq: Four times a day (QID) | RECTAL | Status: DC | PRN

## 2015-03-06 MED ORDER — INSULIN GLARGINE 100 UNIT/ML SOLOSTAR PEN: 15.0000 [IU] | PEN_INJECTOR | Freq: Every day | SUBCUTANEOUS | Status: DC

## 2015-03-06 MED ORDER — EMTRICITAB-RILPIVIR-TENOFOV DF 200-25-300 MG PO TABS: 1.0000 | ORAL_TABLET | Freq: Every day | ORAL | Status: DC

## 2015-03-06 NOTE — ED Notes (Signed)
Pt verbalized understanding of d/c instructions and is going back to [facility], Pt in no acute distress upon disharge

## 2015-03-06 NOTE — ED Notes (Signed)
Per ems- pt d/c from hospital admission today for hypoglycemia. Pt sts he started feeling the same way he did yesterday. cbg 101 and 145 for ems- pt c/o weakness/ general malaise, and shaky. Also c/o headache.

## 2015-03-06 NOTE — H&P (Signed)
Date: 03/06/2015               Patient Name:  Michael [name] MRN: [id number]  DOB: 04/06/1951 Age / Sex: 64 y.o., male   PCP: Michael Herrlich, MD         Medical Service: Internal Medicine Teaching Service         Attending Physician: Dr. Annia Belt, MD    First Contact: Dr. Karle Starch [name] Pager: [phone]  Second Contact: Dr. Joni [name] Pager: [phone]       After Hours (After 5p/  First Contact Pager: [phone]  weekends / holidays): Second Contact Pager: [phone]   Chief Complaint: hypoglycemia  History of Present Illness: Michael [name] is a 64 year old man with HIV, DM2, CAD, HTN, CKD 3, HL, substance abuse presents with hypoglycemia. He came from [facility] to [facility] as he is being treated for a recent relapse from crack cocaine. He was just seen in [facility] clinic 03/03/15 who added glipizide 2.5 mg daily before lunch. His provider continued his metformin 1000 mg bid and said he should be on lantus 27 u qhs but had been taking 30 u qhs. Today, he reported some of his typical L sided, non-radiating chest pain with associated SOB he gets at rest at about 3 pm which resolved on its own. He took metformin 1000 mg once at noon then again at 4 pm. He says that after dinner he felt hypoglycemic with weakness, dizziness, SOB, chills and checked his blood sugar which was 56. He also says he had blurry vision which has improved somewhat. He ate a peanut butter sandwich with orange juice and it was still 50 so he came to [facility] ED. There, his presenting CBG was 64 and he was given orange juice. He received NS 1.5L bolus. Most recent blood glucose before transfer to [facility] 183. He otherwise denies any fevers, night sweats, abdominal pain, nausea, emesis, diarrhea, constipation, dysuria, focal weakness, paresthesia. He says he last used cocaine about a month ago.  Meds: Current Facility-Administered Medications  Medication Dose Route Frequency Provider Last Rate Last  Dose  . 0.9 %  sodium chloride infusion   Intravenous Continuous Michael Oliphant, MD      . acetaminophen (TYLENOL) tablet 650 mg  650 mg Oral Q6H PRN Michael Oliphant, MD       Or  . acetaminophen (TYLENOL) suppository 650 mg  650 mg Rectal Q6H PRN Michael Oliphant, MD      . albuterol (PROVENTIL) (2.5 MG/3ML) 0.083% nebulizer solution 2.5 mg  2.5 mg Inhalation Q6H PRN Michael Oliphant, MD      . allopurinol (ZYLOPRIM) tablet 200 mg  200 mg Oral Daily Michael Oliphant, MD      . aspirin EC tablet 81 mg  81 mg Oral Daily Michael Oliphant, MD      . clopidogrel (PLAVIX) tablet 75 mg  75 mg Oral Daily Michael Oliphant, MD      . DULoxetine (CYMBALTA) DR capsule 60 mg  60 mg Oral Daily Michael Oliphant, MD      . emtricitabine-tenofovir (TRUVADA) 200-300 MG per tablet 1 tablet  1 tablet Oral Daily Michael Belt, MD       And  . rilpivirine [facility]) tablet 25 mg  25 mg Oral Daily Michael Belt, MD      . heparin injection 5,000 Units  5,000 Units Subcutaneous 3 times per day [facility]  Michael Lard, MD      . ipratropium-albuterol (DUONEB) 0.5-2.5 (3) MG/3ML nebulizer solution 3 mL  3 mL Nebulization Q4H PRN Michael Oliphant, MD      . isosorbide mononitrate (IMDUR) 24 hr tablet 30 mg  30 mg Oral Daily Michael Oliphant, MD      . pravastatin (PRAVACHOL) tablet 40 mg  40 mg Oral Daily Michael Oliphant, MD      . QUEtiapine (SEROQUEL) tablet 50 mg  50 mg Oral QHS Michael Oliphant, MD      . sodium chloride 0.9 % injection 3 mL  3 mL Intravenous Q12H Michael Oliphant, MD        Allergies: Allergies as of 03/05/2015  . (No Known Allergies)   Past Medical History  Diagnosis Date  . Diabetes mellitus     Diagnosed in 2002, started insulin in 2012  . Hypertension   . Headache(784.0)     CT head 08/2011: Periventricular and subcortical white matter hypodensities are most in keeping with chronic microangiopathic change  . Gout   . Hyperlipidemia   . HIV infection Nov 2012    Followed by  Michael [name]  . Crack cocaine use     for 20+ years, has been enrolled in detox programs in the past  . Chondromalacia of medial femoral condyle     Left knee MRI 04/28/12: Chondromalacia of the medial femoral condyle with slight peripheral degeneration of the meniscocapsular junction of the medial meniscus; followed by sports medicine  . Asthma     No PFTs, history of childhood asthma  . Depression     with history of hospitalization for suicidal ideation  . CAD (coronary artery disease)     a. 06/2013 STEMI/PCI ([facility]): LAD w/ thrombus (treated with BMS), mid 75%, D2 75%; LCX OM2 75%; RCA small, PDA 95%, PLV 95%;  b. 10/2013 Cath/PCI: ISR w/in LAD (Promus DES x 2), borderline OM2 lesion;  c. 01/2014 MV: Intermediate risk, medium-sized distal ant wall infarct w/ very small amt of peri-infarct ischemia. EF 60%.  . Gout 04/28/2012  . Collagen vascular disease   . Cellulitis 04/2014    left facial   Past Surgical History  Procedure Laterality Date  . Bowel resection    . Back surgery      1988  . Cardiac surgery    . Cervical spine surgery      " rods in my neck "  . Coronary stent placement    . Nm myocar perf wall motion  12/27/2011    normal  . Spine surgery     Family History  Problem Relation Age of Onset  . Diabetes Mother   . Hypertension Mother   . Hyperlipidemia Mother   . Diabetes Father   . Cancer Father   . Hypertension Father   . Diabetes Brother   . Heart disease Brother   . Colon cancer Neg Hx   . Diabetes Sister    History   Social History  . Marital Status: Divorced    Spouse Name: N/A  . Number of Children: N/A  . Years of Education: N/A   Occupational History  . Not on file.   Social History Main Topics  . Smoking status: Never Smoker   . Smokeless tobacco: Never Used  . Alcohol Use: No  . Drug Use: Yes    Special: "Crack" cocaine, Cocaine     Comment: last used crack 04/2014.  Marland Kitchen Sexual Activity: No  Comment: accepted condoms   Other Topics  Concern  . Not on file   Social History Narrative   Currently staying with a friend in [place].  Was staying @ local motel until a few days ago - left b/c of bed bugs.    Review of Systems: Review of systems negative except as noted above per HPI  Physical Exam: Blood pressure 142/82, pulse 82, temperature 98.3 F (36.8 C), temperature source Oral, resp. rate 19, height 5' 7.5" (1.715 m), weight 178 lb 2.1 oz (80.8 kg), SpO2 100 %.  Gen: No acute distress, well developed, well nourished HEENT: Atraumatic, PERRL, EOMI, sclerae anicteric, moist mucous membranes Heart: Irregular, normal S1 S2, no murmurs, rubs, or gallops Lungs: Clear to auscultation bilaterally, respirations unlabored Abd: Soft, non-tender, non-distended, + bowel sounds, no hepatosplenomegaly Ext: No edema or cyanosis Neuro: A&O x 4, CN II-XII intact, finger-nose-finger coordination intact, strength 5/5 and symmetric in all extremities, sensation grossly intact, no Babinkski sign b/l  Lab results: Basic Metabolic Panel:  Recent Labs  03/05/15 2247  NA 139  K 4.8  CL 103  CO2 25  GLUCOSE 143*  BUN 25*  CREATININE 1.44*  CALCIUM 9.7   CBC:  Recent Labs  03/05/15 2247  WBC 4.3  HGB 12.8*  HCT 38.7*  MCV 83.8  PLT 190   Cardiac Enzymes:  Recent Labs  03/05/15 2247  TROPONINI <0.03   CBG:  Recent Labs  03/03/15 1446 03/05/15 2258 03/05/15 2321 03/06/15 0004  GLUCAP 136* 111* 147* 183*   Hemoglobin A1C:  Recent Labs  03/03/15 1456  HGBA1C 8.4   Urine Drug Screen: Drugs of Abuse     Component Value Date/Time   LABOPIA NONE DETECTED 12/10/2014 1517   COCAINSCRNUR POSITIVE* 12/10/2014 1517   LABBENZ NONE DETECTED 12/10/2014 1517   AMPHETMU NONE DETECTED 12/10/2014 1517   THCU NONE DETECTED 12/10/2014 1517   LABBARB NONE DETECTED 12/10/2014 1517    Alcohol Level: No results for input(s): ETH in the last 72 hours. Urinalysis:  Recent Labs  03/06/15 0100  COLORURINE YELLOW    LABSPEC 1.015  PHURINE 5.5  GLUCOSEU 100*  HGBUR NEGATIVE  BILIRUBINUR NEGATIVE  KETONESUR 15*  PROTEINUR NEGATIVE  UROBILINOGEN 0.2  NITRITE NEGATIVE  LEUKOCYTESUR NEGATIVE   Misc. Labs: i-stat lactic acid 3.70, 3.74  Imaging results:  Dg Chest 2 View  03/06/2015   CLINICAL DATA:  Acute onset of hypoglycemia.  Initial encounter.  EXAM: CHEST  2 VIEW  COMPARISON:  Chest radiograph performed 12/10/2014  FINDINGS: The lungs are well-aerated and clear. There is no evidence of focal opacification, pleural effusion or pneumothorax.  The heart is normal in size; the mediastinal contour is within normal limits. No acute osseous abnormalities are seen.  IMPRESSION: No acute cardiopulmonary process seen.   Electronically Signed   By: Garald Balding M.D.   On: 03/06/2015 01:04    Other results: EKG: sinus arrhythmia, no ST or t-wave changes, compared to prior 12/10/14  Assessment & Plan by Problem: Principal Problem:   Lactic acidosis Active Problems:   HIV (human immunodeficiency virus infection)   Uncontrolled type 2 diabetes with neuropathy   Hyperlipidemia with target LDL less than 100   Hypertension goal BP (blood pressure) < 140/80   3-vessel CAD   Asthma, chronic   Cocaine use disorder, severe, dependence   Substance or medication-induced depressive disorder with onset during withdrawal   CKD (chronic kidney disease) stage 3, GFR 30-59 ml/min   Hypoglycemia  Normocytic anemia  #Lactic acidosis: Presenting lactic acidosis 3.70. This could be due to his taking metformin 1000 mg twice within 5 hours. His complera HIV medication can also cause lactic acidosis. He says he has not used cocaine in a year but this could cause lactic acidosis if he has relapsed. ACS could also cause ischemia but initial trop negative and EKG without ischemic changes -hold metformin 1000 mg bid, cont complera 200-25-300 mg 1 tab daily for now -NS @ 100 cc/hr -troponin q6h x 3 -repeat EKG in  am -UDS  #Hypoglycemic in setting of DM2: Michael Hittner had reported blood sugar of 56 at home, which was again 50 after eating peanut butter sandwich with orange juice. This was after metformin 1000 mg at noon and then 4 pm. His presenting CBG was 64 in ED. This could be in setting of newly added glipizide 2.5 mg daily and taking both metformin doses within four hours.  -hold all glycemic medications for now while monitoring -CBG q4h  #Chest Pain with history of CAD: Michael Toepfer has 2 DES placed in LAD 10/2013 when he presented with in-stent restenosis in a BMS that was placed at time of STEMI 06/2013. He was last seen by Dr Sallyanne Kuster of cardiology 07/2014 who at that time recommended continuing Plavix. He has chest pain today that is sharp, L sided and he says is similar to his chronic intermittent chest pain. At home he takes ASA 81 mg daily, Plavix 75 mg daily. -cont ASA 81 mg daily, Plavix 75 mg daily -troponin q6h x 3 -repeat EKG in am  #HIV: Last HIV labs 09/2014 with RNA 83, CD4 570. He is followed by Dr Johnnye [name] of ID who saw him last 02/03/14 with no changes in management. He had a no-show 08/2014. At home he is on complera (emtricitabine-rilpivirine-tenofovir) 200-25-300 mg 1 tab daily -cont complera 200-25-300 mg 1 tab daily -ID follow-up scheduled 03/20/15   #HTN: BP in ED 120s-130s/70s-80s. At home he is on imdur 30 mg daily, lisinopril 10 mg daily, pravastatin 40 mg daily, ASA 81 mg daily. -cont imdur 30 mg daily, lisinopril 10 mg daily, pravastatin 40 mg daily, ASA 81 mg daily  #Asthma: Michael Leetch says that he felt SOB today. His lungs were clear on exam. At home he is on albuterol 2 puff q6hprn. -duoneb q4hprn -albuterol inh q6hprn  #CKD stage 3: Presenting creatinine 1.44, baseline ~1.3 for past year. -cont to monitor  #Normocytic Anemia: Presenting hemoglobin 12.8 w MCV 84. No anemia panel in record but likely related to CKD. Baseline appears around 12. -cont to  monitor -consider anemia panel  #HL: Last lipid panel 10/2013 w cholesterol 146, LDL 79, HDL 42, triglyceride 124. At home he is on pravastatin 40 mg daily. -cont pravastatin 40 mg daily.  #Diet: carb mod  #DVT PPx: SCDs  #Code: Full  Dispo: Disposition is deferred at this time, awaiting improvement of current medical problems. Anticipated discharge in approximately 1-2 day(s).   The patient does have a current PCP Michael Herrlich, MD) and does need an [facility] hospital follow-up appointment after discharge.  The patient does have transportation limitations that hinder transportation to clinic appointments.  Signed: Lottie Mussel, MD Internal Medicine, PGY-1 Pager [phone] 03/06/2015, 2:55 AM

## 2015-03-06 NOTE — ED Notes (Signed)
Pt transported to CT ?

## 2015-03-06 NOTE — Clinical Social Work Note (Signed)
Clinical Social Work Assessment  Patient Details  Name: Michael [name] MRN: [id number] Date of Birth: 01/02/1951  Date of referral:  03/06/15               Reason for consult:  Discharge Planning, Substance Use/ETOH Abuse, Other (Comment Required) (Admitted from: [place])                Permission sought to share information with:  Facility Sport and exercise psychologist Permission granted to share information::  Yes, Verbal Permission Granted  Name::        Agency::  [facility]  Relationship::     Contact Information:  [phone], [phone]  Housing/Transportation Living arrangements for the past 2 months:  [place] ([place] for the past 18 days.) Source of Information:  Patient, Outpatient Provider Patient Interpreter Needed:  None Criminal Activity/Legal Involvement Pertinent to Current Situation/Hospitalization:  No - Comment as needed Significant Relationships:  None Lives with:  Self, Facility Resident, Other (Comment) (Resident at [place]) Do you feel safe going back to the place where you live?  Yes Need for family participation in patient care:  No (Coment)  Care giving concerns:  Patient expressed no concerns at this time.   Social Worker assessment / plan:  CSW received consult stating patient admitted from [place]. CSW contacted facility to inquire about patient returning at time of discharge. Per facility, patient able to return once medically stable. CSW spoke with patient regarding discharge disposition. Patient agreeable to returning to [facility], stating patient has been a resident at the facility for 18 days. CSW notified MD. Patient to return to [facility] on 03/06/2015.  Employment status:  Retired Forensic scientist:  Other (Comment Required) ([employer]) PT Recommendations:  Not assessed at this  time Information / Referral to community resources:  Other (Comment Required) (Patient returning to [place])  Patient/Family's Response to care:  Patient understanding and agreeable to CSW plan of care.  Patient/Family's Understanding of and Emotional Response to Diagnosis, Current Treatment, and Prognosis:  Patient understanding and agreeable to CSW plan of care.  Emotional Assessment Appearance:  Appears stated age Attitude/Demeanor/Rapport:  Other (Pleasant) Affect (typically observed):  Accepting, Appropriate, Calm, Pleasant, Hopeful Orientation:  Oriented to Self, Oriented to Place, Oriented to  Time, Oriented to Situation Alcohol / Substance use:  Other, Alcohol Use (Patient returning to [place]) Psych involvement (Current and /or in the community):  No (Comment) (Not appropriate on this admission.)  Discharge Needs  Concerns to be addressed:  No discharge needs identified Readmission within the last 30 days:  No Current discharge risk:  None Barriers to Discharge:  No Barriers Identified   [name], LCSW 03/06/2015, 11:44 AM [phone]

## 2015-03-06 NOTE — ED Provider Notes (Signed)
CSN: [id number]     Arrival date & time 03/06/15  1646 History   First MD Initiated Contact with Patient 03/06/15 1701     Chief Complaint  Patient presents with  . Weakness     (Consider location/radiation/quality/duration/timing/severity/associated sxs/prior Treatment) HPI Comments: Patient presents to the ER for evaluation of weakness and near syncope. Patient reports that he was just hospitalized for low blood sugars, since discharge yesterday, he has not been feeling well. He has been feeling very weak but he has episodes of severe weakness, dizziness and feeling like he is going to pass out. He has been having episodes of left-sided chest pain. He does have a cardiac history. He feels like he is having trouble breathing, reports a history of asthma.  Patient is a 64 y.o. male presenting with weakness.  Weakness Associated symptoms include chest pain and shortness of breath. Pertinent negatives include no abdominal pain.    Past Medical History  Diagnosis Date  . Diabetes mellitus     Diagnosed in 2002, started insulin in 2012  . Hypertension   . Headache(784.0)     CT head 08/2011: Periventricular and subcortical white matter hypodensities are most in keeping with chronic microangiopathic change  . Gout   . Hyperlipidemia   . HIV infection Nov 2012    Followed by [name]  . Crack cocaine use     for 20+ years, has been enrolled in detox programs in the past  . Chondromalacia of medial femoral condyle     Left knee MRI 04/28/12: Chondromalacia of the medial femoral condyle with slight peripheral degeneration of the meniscocapsular junction of the medial meniscus; followed by sports medicine  . Asthma     No PFTs, history of childhood asthma  . Depression     with history of hospitalization for suicidal ideation  . CAD (coronary artery disease)     a. 06/2013 STEMI/PCI ([facility]): LAD w/ thrombus (treated with BMS), mid 75%, D2 75%; LCX OM2 75%; RCA small, PDA 95%, PLV 95%;  b.  10/2013 Cath/PCI: ISR w/in LAD (Promus DES x 2), borderline OM2 lesion;  c. 01/2014 MV: Intermediate risk, medium-sized distal ant wall infarct w/ very small amt of peri-infarct ischemia. EF 60%.  . Gout 04/28/2012  . Collagen vascular disease   . Cellulitis 04/2014    left facial   Past Surgical History  Procedure Laterality Date  . Bowel resection    . Back surgery      1988  . Cardiac surgery    . Cervical spine surgery      " rods in my neck "  . Coronary stent placement    . Nm myocar perf wall motion  12/27/2011    normal  . Spine surgery     Family History  Problem Relation Age of Onset  . Diabetes Mother   . Hypertension Mother   . Hyperlipidemia Mother   . Diabetes Father   . Cancer Father   . Hypertension Father   . Diabetes Brother   . Heart disease Brother   . Colon cancer Neg Hx   . Diabetes Sister    History  Substance Use Topics  . Smoking status: Never Smoker   . Smokeless tobacco: Never Used  . Alcohol Use: No    Review of Systems  Constitutional: Positive for fatigue.  Respiratory: Positive for shortness of breath.   Cardiovascular: Positive for chest pain.  Gastrointestinal: Negative for abdominal pain.  Neurological: Positive for dizziness and  weakness.  All other systems reviewed and are negative.     Allergies  Review of patient's allergies indicates no known allergies.  Home Medications   Prior to Admission medications   Medication Sig Start Date End Date Taking? Authorizing Provider  albuterol (PROVENTIL HFA;VENTOLIN HFA) 108 (90 BASE) MCG/ACT inhaler Inhale 2 puffs into the lungs every 6 (six) hours as needed for wheezing or shortness of breath. 12/20/14   Encarnacion Slates, NP  allopurinol (ZYLOPRIM) 100 MG tablet Take 2 tablets (200 mg total) by mouth daily. For gouty arthritis 12/20/14   Encarnacion Slates, NP  aspirin EC 81 MG tablet Take 1 tablet (81 mg total) by mouth daily. For heart health 03/03/15   Norman Herrlich, MD  clopidogrel (PLAVIX)  75 MG tablet Take 1 tablet (75 mg total) by mouth daily. For prevention of stroke 03/03/15   Norman Herrlich, MD  COMPLERA 200-25-300 MG TABS TAKE 1 TABLET BY MOUTH DAILY. 01/18/15   [name], MD  diclofenac sodium (VOLTAREN) 1 % GEL APPLY TWO   GMS TOPICALLY TWO   TIMES DAILY AS NEEDED (PAIN). 02/08/15   Norman Herrlich, MD  DULoxetine (CYMBALTA) 60 MG capsule TAKE 1 CAPSULE   BY MOUTH   DAILY 12/26/14   Norman Herrlich, MD  ibuprofen (ADVIL,MOTRIN) 200 MG tablet Take 200 mg by mouth every 6 (six) hours as needed for moderate pain.    Historical Provider, MD  Insulin Glargine (LANTUS SOLOSTAR) 100 UNIT/ML Solostar Pen Inject 15 Units into the skin at bedtime. 03/06/15   Langley Gauss Moding, MD  ipratropium-albuterol (DUONEB) 0.5-2.5 (3) MG/3ML SOLN Take 3 mLs by nebulization every 4 (four) hours as needed (shortness of breath). 12/20/14   Encarnacion Slates, NP  isosorbide mononitrate (IMDUR) 30 MG 24 hr tablet Take 1 tablet (30 mg total) by mouth daily. For chest pain 12/20/14   Encarnacion Slates, NP  lisinopril (PRINIVIL,ZESTRIL) 10 MG tablet Take 1 tablet (10 mg total) by mouth daily. For high blood pressure 03/03/15   Norman Herrlich, MD  metFORMIN (GLUCOPHAGE) 1000 MG tablet Take 1 tablet (1,000 mg total) by mouth 2 (two) times daily with a meal. For diabetes management 12/20/14   Encarnacion Slates, NP  pravastatin (PRAVACHOL) 40 MG tablet Take 1 tablet (40 mg total) by mouth daily. For high cholesterol 12/20/14   Encarnacion Slates, NP  QUEtiapine (SEROQUEL) 50 MG tablet Take 1 tablet (50 mg total) by mouth at bedtime. For mood control 12/20/14   Encarnacion Slates, NP   BP 127/74 mmHg  Pulse 87  Temp(Src) 97.7 F (36.5 C) (Oral)  Resp 17  SpO2 100% Physical Exam  Constitutional: He is oriented to person, place, and time. He appears well-developed and well-nourished. No distress.  HENT:  Head: Normocephalic and atraumatic.  Right Ear: Hearing normal.  Left Ear: Hearing normal.  Nose: Nose normal.  Mouth/Throat:  Oropharynx is clear and moist and mucous membranes are normal.  Eyes: Conjunctivae and EOM are normal. Pupils are equal, round, and reactive to light.  Neck: Normal range of motion. Neck supple.  Cardiovascular: Regular rhythm, S1 normal and S2 normal.  Exam reveals no gallop and no friction rub.   No murmur heard. Pulmonary/Chest: Effort normal and breath sounds normal. No respiratory distress. He exhibits no tenderness.  Abdominal: Soft. Normal appearance and bowel sounds are normal. There is no hepatosplenomegaly. There is no tenderness. There is no rebound, no guarding, no tenderness at McBurney's  point and negative Murphy's sign. No hernia.  Musculoskeletal: Normal range of motion.  Neurological: He is alert and oriented to person, place, and time. He has normal strength. No cranial nerve deficit or sensory deficit. Coordination normal. GCS eye subscore is 4. GCS verbal subscore is 5. GCS motor subscore is 6.  Skin: Skin is warm, dry and intact. No rash noted. No cyanosis.  Psychiatric: He has a normal mood and affect. His speech is normal and behavior is normal. Thought content normal.  Nursing note and vitals reviewed.   ED Course  Procedures (including critical care time) Labs Review Labs Reviewed  CBC WITH DIFFERENTIAL/PLATELET - Abnormal; Notable for the following:    WBC 2.8 (*)    RBC 4.10 (*)    Hemoglobin 11.1 (*)    HCT 33.7 (*)    Platelets 146 (*)    All other components within normal limits  COMPREHENSIVE METABOLIC PANEL - Abnormal; Notable for the following:    Glucose, Bld 247 (*)    Creatinine, Ser 1.26 (*)    Calcium 8.5 (*)    ALT 16 (*)    GFR calc non Af Amer 59 (*)    Anion gap 3 (*)    All other components within normal limits  URINALYSIS, ROUTINE W REFLEX MICROSCOPIC (NOT AT [facility]) - Abnormal; Notable for the following:    Glucose, UA 500 (*)    All other components within normal limits  I-STAT CG4 LACTIC ACID, ED - Abnormal; Notable for the following:     Lactic Acid, Venous 2.25 (*)    All other components within normal limits  URINE RAPID DRUG SCREEN (HOSP PERFORMED) NOT AT [facility]  TROPONIN I  BRAIN NATRIURETIC PEPTIDE  CK    Imaging Review Dg Chest 2 View  03/06/2015   CLINICAL DATA:  SOB x 2 days; productive cough, right and left shoulder pain with left side chest pain x 2 wks. Hx of asthma, HTN, diabetes.  EXAM: CHEST  2 VIEW  COMPARISON:  03/06/2015  FINDINGS: Cardiac silhouette normal in size. There is a long coronary artery stent, stable. Normal mediastinal and hilar contours.  Clear lungs.  No pleural effusion or pneumothorax.  Bony thorax is demineralized but grossly intact.  IMPRESSION: No active cardiopulmonary disease.   Electronically Signed   By: Lajean Manes M.D.   On: 03/06/2015 17:34   Dg Chest 2 View  03/06/2015   CLINICAL DATA:  Acute onset of hypoglycemia.  Initial encounter.  EXAM: CHEST  2 VIEW  COMPARISON:  Chest radiograph performed 12/10/2014  FINDINGS: The lungs are well-aerated and clear. There is no evidence of focal opacification, pleural effusion or pneumothorax.  The heart is normal in size; the mediastinal contour is within normal limits. No acute osseous abnormalities are seen.  IMPRESSION: No acute cardiopulmonary process seen.   Electronically Signed   By: Garald Balding M.D.   On: 03/06/2015 01:04   Ct Angio Chest Pe W/cm &/or Wo Cm  03/06/2015   CLINICAL DATA:  Shortness of breath, chest pain, cough  EXAM: CT ANGIOGRAPHY CHEST WITH CONTRAST  TECHNIQUE: Multidetector CT imaging of the chest was performed using the standard protocol during bolus administration of intravenous contrast. Multiplanar CT image reconstructions and MIPs were obtained to evaluate the vascular anatomy.  CONTRAST:  133mL OMNIPAQUE IOHEXOL 350 MG/ML SOLN  COMPARISON:  Chest radiograph status 03/06/2015.  FINDINGS: No evidence of pulmonary embolism.  Mediastinum/Nodes: Heart is normal in size. No pericardial effusion.  Coronary  atherosclerosis.  Coronary stent.  Mild atherosclerotic calcifications of the aortic arch.  No suspicious mediastinal, hilar, or axillary lymphadenopathy.  Visualized thyroid is unremarkable.  Lungs/Pleura: Focal patchy opacity in the medial right lower lobe (series 6/ image 45), likely subpleural atelectasis/scarring, less likely infectious/inflammatory.  No focal consolidation.  No suspicious pulmonary nodules.  No pleural effusion or pneumothorax.  Upper abdomen: Visualized upper abdomen is within normal limits.  Musculoskeletal: Degenerative changes of the visualized thoracolumbar spine.  Review of the MIP images confirms the above findings.  IMPRESSION: No evidence of pulmonary embolism.  Probable subpleural atelectasis versus scarring in the medial right lower lobe.  No evidence of acute cardiopulmonary disease.   Electronically Signed   By: Julian Hy M.D.   On: 03/06/2015 21:27     EKG Interpretation   Date/Time:  Monday Mar 06 2015 16:55:18 EDT Ventricular Rate:  69 PR Interval:  155 QRS Duration: 72 QT Interval:  357 QTC Calculation: 382 R Axis:   67 Text Interpretation:  Sinus rhythm Atrial premature complexes Probable  left atrial enlargement Anteroseptal infarct, old Baseline wander in  lead(s) V5 V6 No significant change since last tracing Confirmed by  Jermar Colter  MD, Digna Countess [phone]) on 03/06/2015 5:12:52 PM      MDM   Final diagnoses:  Shortness of breath   generalized weakness  Patient presents to the ER for evaluation of generalized weakness and feeling like he is going to pass out. Patient was hospitalized yesterday for similar symptoms. He was treated for mild hypoglycemia. Blood sugars have stabilized. His workup today has been entirely unremarkable. Cardiac evaluation negative. Negative for PE. All workup is unremarkable and vital signs are normal. Patient was reassured, is appropriate for discharge.    Orpah Greek, MD 03/06/15 2227

## 2015-03-06 NOTE — Discharge Summary (Signed)
Name: Michael [name] MRN: [id number] DOB: 05-28-1951 64 y.o. PCP: Norman Herrlich, MD  Date of Admission: 03/05/2015 10:12 PM Date of Discharge: 03/06/2015 Attending Physician: Annia Belt, MD  Discharge Diagnosis: Principal Problem:   Hypoglycemia Active Problems:   HIV (human immunodeficiency virus infection)   Uncontrolled type 2 diabetes with neuropathy   Hyperlipidemia with target LDL less than 100   Hypertension goal BP (blood pressure) < 140/80   3-vessel CAD   Asthma, chronic   Cocaine use disorder, severe, dependence   Substance or medication-induced depressive disorder with onset during withdrawal   Lactic acidosis   CKD (chronic kidney disease) stage 3, GFR 30-59 ml/min   Normocytic anemia  Discharge Medications:   Medication List    STOP taking these medications        glipiZIDE 5 MG tablet  Commonly known as:  GLUCOTROL      TAKE these medications        albuterol 108 (90 BASE) MCG/ACT inhaler  Commonly known as:  PROVENTIL HFA;VENTOLIN HFA  Inhale 2 puffs into the lungs every 6 (six) hours as needed for wheezing or shortness of breath.     allopurinol 100 MG tablet  Commonly known as:  ZYLOPRIM  Take 2 tablets (200 mg total) by mouth daily. For gouty arthritis     aspirin EC 81 MG tablet  Take 1 tablet (81 mg total) by mouth daily. For heart health     clopidogrel 75 MG tablet  Commonly known as:  PLAVIX  Take 1 tablet (75 mg total) by mouth daily. For prevention of stroke     COMPLERA 200-25-300 MG Tabs  Generic drug:  Emtricitab-Rilpivir-Tenofov DF  TAKE 1 TABLET BY MOUTH DAILY.     diclofenac sodium 1 % Gel  Commonly known as:  VOLTAREN  APPLY TWO   GMS TOPICALLY TWO   TIMES DAILY AS NEEDED (PAIN).     DULoxetine 60 MG capsule  Commonly known as:  CYMBALTA  TAKE 1 CAPSULE   BY MOUTH   DAILY     ibuprofen 200 MG tablet  Commonly known as:  ADVIL,MOTRIN  Take 200 mg by mouth every 6 (six) hours as needed for moderate pain.       Insulin Glargine 100 UNIT/ML Solostar Pen  Commonly known as:  LANTUS SOLOSTAR  Inject 15 Units into the skin at bedtime.     ipratropium-albuterol 0.5-2.5 (3) MG/3ML Soln  Commonly known as:  DUONEB  Take 3 mLs by nebulization every 4 (four) hours as needed (shortness of breath).     isosorbide mononitrate 30 MG 24 hr tablet  Commonly known as:  IMDUR  Take 1 tablet (30 mg total) by mouth daily. For chest pain     lisinopril 10 MG tablet  Commonly known as:  PRINIVIL,ZESTRIL  Take 1 tablet (10 mg total) by mouth daily. For high blood pressure     metFORMIN 1000 MG tablet  Commonly known as:  GLUCOPHAGE  Take 1 tablet (1,000 mg total) by mouth 2 (two) times daily with a meal. For diabetes management     pravastatin 40 MG tablet  Commonly known as:  PRAVACHOL  Take 1 tablet (40 mg total) by mouth daily. For high cholesterol     QUEtiapine 50 MG tablet  Commonly known as:  SEROQUEL  Take 1 tablet (50 mg total) by mouth at bedtime. For mood control        Disposition and follow-up:   [name] was discharged from [facility] in Stable condition.  At the hospital follow up visit please address:  1.  Please give metformin pills 12 hours apart, blood sugar control, cardiology follow-up.  2.  Labs / imaging needed at time of follow-up: CBG.  3.  Pending labs/ test needing follow-up: None.  Follow-up Appointments: Follow-up Information    Follow up with [name], MD. Call in 1 day.   Specialty:  Internal Medicine   Why:  To schedule follow up appointment.   Contact information:   [place] [address] [phone]       Discharge Instructions: Discharge Instructions    Call MD for:  difficulty breathing, headache or visual disturbances    Complete by:  As directed      Call MD for:  extreme fatigue    Complete by:  As directed      Call MD for:  hives    Complete by:  As directed      Call MD for:  persistant  dizziness or light-headedness    Complete by:  As directed      Call MD for:  persistant nausea and vomiting    Complete by:  As directed      Call MD for:  redness, tenderness, or signs of infection (pain, swelling, redness, odor or green/yellow discharge around incision site)    Complete by:  As directed      Call MD for:  severe uncontrolled pain    Complete by:  As directed      Call MD for:  temperature >100.4    Complete by:  As directed      Diet - low sodium heart healthy    Complete by:  As directed      Increase activity slowly    Complete by:  As directed            Thank you for allowing [place] to be involved in your healthcare while you were hospitalized at [facility].   Please note that there have been changes to your home medications.  --> PLEASE LOOK AT YOUR DISCHARGE MEDICATION LIST FOR DETAILS.  Please call your PCP if you have any questions or concerns, or any difficulty getting any of your medications.  Please return to the ER if you have worsening of your symptoms or new severe symptoms arise.  Please stop taking glipizide and reduce your Lantus to 15 units daily.  Please have the workers at [facility] administer your metformin pills 12 hours apart to not cause any more hypoglycemia.  Make sure to follow up with your PCP so your diabetes medications can be titrated.  Consultations: None.  Procedures Performed:  Dg Chest 2 View  03/06/2015   CLINICAL DATA:  Acute onset of hypoglycemia.  Initial encounter.  EXAM: CHEST  2 VIEW  COMPARISON:  Chest radiograph performed 12/10/2014  FINDINGS: The lungs are well-aerated and clear. There is no evidence of focal opacification, pleural effusion or pneumothorax.  The heart is normal in size; the mediastinal contour is within normal limits. No acute osseous abnormalities are seen.  IMPRESSION: No acute cardiopulmonary process seen.   Electronically Signed   By: Garald Balding M.D.   On: 03/06/2015 01:04    Admission HPI:  Mr Demmon is a 64 year old man with HIV, DM2, CAD, HTN, CKD 3, HL, substance abuse presents with hypoglycemia. He came from [facility] to [facility] as he  is being treated for a recent relapse from crack cocaine. He was just seen in [facility] clinic 03/03/15 who added glipizide 2.5 mg daily before lunch. His provider continued his metformin 1000 mg bid and said he should be on lantus 27 u qhs but had been taking 30 u qhs. Today, he reported some of his typical L sided, non-radiating chest pain with associated SOB he gets at rest at about 3 pm which resolved on its own. He took metformin 1000 mg once at noon then again at 4 pm. He says that after dinner he felt hypoglycemic with weakness, dizziness, SOB, chills and checked his blood sugar which was 56. He also says he had blurry vision which has improved somewhat. He ate a peanut butter sandwich with orange juice and it was still 50 so he came to [facility] ED. There, his presenting CBG was 64 and he was given orange juice. He received NS 1.5L bolus. Most recent blood glucose before transfer to [facility]. He otherwise denies any fevers, night sweats, abdominal pain, nausea, emesis, diarrhea, constipation, dysuria, focal weakness, paresthesia. He says he last used cocaine about a month ago.  Hospital Course by problem list: Principal Problem:   Hypoglycemia Active Problems:   HIV (human immunodeficiency virus infection)   Uncontrolled type 2 diabetes with neuropathy   Hyperlipidemia with target LDL less than 100   Hypertension goal BP (blood pressure) < 140/80   3-vessel CAD   Asthma, chronic   Cocaine use disorder, severe, dependence   Substance or medication-induced depressive disorder with onset during withdrawal   Lactic acidosis   CKD (chronic kidney disease) stage 3, GFR 30-59 ml/min   Normocytic anemia   #Hypoglycemia The patient presented from the [facility] substance recovery [facility] where he is being  treated for a recent relapse of cocaine use. He reported having blood sugars in the 50s prior to presentation. The patient had been seen in [facility] clinic a few days prior to admission where he reported having symptomatic episodes with blood sugars in the 70s and 80s in the morning. However, his evening blood sugars were noted to occasionally be elevated, so he was started on glipizide 2.5 mg before lunch. It is likely that glipizide precipitated his hypoglycemic event. In addition, the patient had been receiving his metformin only 5 hours apart at [facility]. It is likely that his dose of Lantus is too high as well, which is contributing to his lower blood sugars in the mornings. On holding the patient's home diabetes medications, his blood sugars were noted to run in the 150s during the admission. The patient's home glipizide was discontinued, and his Lantus dose was reduced from 27 to 15 units daily at bedtime. He was told to resume his home metformin 1000 mg twice a day, but to be sure to take his doses 12 hours apart. He was discharged back to [facility] to complete his treatment for substance abuse.  At follow-up, please check the patient's blood sugar control, and titrate his diabetes regimen as indicated.  #Elevated lactic acid The ER physician checked a lactic acid level due to concern that the patient had taken too much metformin. It was noted to be slightly elevated at 3.7, but the patient had no evidence of hypoperfusion. This corrected to normal with IV fluids. It is possible that the patient's lactic acid was elevated due to taking metformin doses too close together. However, it is unlikely that metformin will continue to lead to lactic acidosis given  the patient's relatively good kidney function with a GFR of greater than 60.  #Chest pain The patient reported left-sided chest pain that resolved on its own, which is chronic for him. He previously had 2 drug-eluting stents placed in January 2015. He follows  with Dr. Sallyanne Kuster of cardiology who he last saw in October 2015. His troponins and EKG were trended with no signs of acute ischemia. Please have the patient follow up with his cardiologist as an outpatient.  #HIV The patient has relatively good control of his HIV with his last HIV RNA of 83 in 09/2014 with a CD4 of 570 on Complera at home.  Although Complera has been associated with lactic acidosis, it was thought to be unlikely that this is the etiology of his elevated lactic acid. As result, his home medications were continued during the hospitalization and at discharge. He is scheduled to follow-up with [name] of infectious disease on 03/20/2015.  Discharge Vitals:   BP 112/70 mmHg  Pulse 98  Temp(Src) 98 F (36.7 C) (Oral)  Resp 18  Ht 5' 7.5" (1.715 m)  Wt 179 lb 3.7 oz (81.3 kg)  BMI 27.64 kg/m2  SpO2 98%  Discharge Labs:  Results for orders placed or performed during the hospital encounter of 03/05/15 (from the past 24 hour(s))  CBC     Status: Abnormal   Collection Time: 03/05/15 10:47 PM  Result Value Ref Range   WBC 4.3 4.0 - 10.5 K/uL   RBC 4.62 4.22 - 5.81 MIL/uL   Hemoglobin 12.8 (L) 13.0 - 17.0 g/dL   HCT 38.7 (L) 39.0 - 52.0 %   MCV 83.8 78.0 - 100.0 fL   MCH 27.7 26.0 - 34.0 pg   MCHC 33.1 30.0 - 36.0 g/dL   RDW 13.0 11.5 - 15.5 %   Platelets 190 150 - 400 K/uL  Basic metabolic panel     Status: Abnormal   Collection Time: 03/05/15 10:47 PM  Result Value Ref Range   Sodium 139 135 - 145 mmol/L   Potassium 4.8 3.5 - 5.1 mmol/L   Chloride 103 101 - 111 mmol/L   CO2 25 22 - 32 mmol/L   Glucose, Bld 143 (H) 65 - 99 mg/dL   BUN 25 (H) 6 - 20 mg/dL   Creatinine, Ser 1.44 (H) 0.61 - 1.24 mg/dL   Calcium 9.7 8.9 - 10.3 mg/dL   GFR calc non Af Amer 50 (L) >60 mL/min   GFR calc Af Amer 58 (L) >60 mL/min   Anion gap 11 5 - 15  Troponin I     Status: None   Collection Time: 03/05/15 10:47 PM  Result Value Ref Range   Troponin I <0.03 <0.031 ng/mL  CBG  monitoring, ED     Status: Abnormal   Collection Time: 03/05/15 10:58 PM  Result Value Ref Range   Glucose-Capillary 111 (H) 65 - 99 mg/dL  CBG monitoring, ED     Status: Abnormal   Collection Time: 03/05/15 11:21 PM  Result Value Ref Range   Glucose-Capillary 147 (H) 65 - 99 mg/dL  I-Stat CG4 Lactic Acid, ED     Status: Abnormal   Collection Time: 03/05/15 11:48 PM  Result Value Ref Range   Lactic Acid, Venous 3.70 (HH) 0.5 - 2.0 mmol/L   Comment NOTIFIED PHYSICIAN   I-Stat CG4 Lactic Acid, ED     Status: Abnormal   Collection Time: 03/05/15 11:53 PM  Result Value Ref Range   Lactic Acid, Venous 3.74 (  HH) 0.5 - 2.0 mmol/L   Comment NOTIFIED PHYSICIAN   CBG monitoring, ED     Status: Abnormal   Collection Time: 03/06/15 12:04 AM  Result Value Ref Range   Glucose-Capillary 183 (H) 65 - 99 mg/dL  Urinalysis, Routine w reflex microscopic (not at [facility])     Status: Abnormal   Collection Time: 03/06/15  1:00 AM  Result Value Ref Range   Color, Urine YELLOW YELLOW   APPearance CLEAR CLEAR   Specific Gravity, Urine 1.015 1.005 - 1.030   pH 5.5 5.0 - 8.0   Glucose, UA 100 (A) NEGATIVE mg/dL   Hgb urine dipstick NEGATIVE NEGATIVE   Bilirubin Urine NEGATIVE NEGATIVE   Ketones, ur 15 (A) NEGATIVE mg/dL   Protein, ur NEGATIVE NEGATIVE mg/dL   Urobilinogen, UA 0.2 0.0 - 1.0 mg/dL   Nitrite NEGATIVE NEGATIVE   Leukocytes, UA NEGATIVE NEGATIVE  Glucose, capillary     Status: Abnormal   Collection Time: 03/06/15  3:45 AM  Result Value Ref Range   Glucose-Capillary 153 (H) 65 - 99 mg/dL   Comment 1 Notify RN   Urine rapid drug screen (hosp performed)     Status: None   Collection Time: 03/06/15  3:47 AM  Result Value Ref Range   Opiates NONE DETECTED NONE DETECTED   Cocaine NONE DETECTED NONE DETECTED   Benzodiazepines NONE DETECTED NONE DETECTED   Amphetamines NONE DETECTED NONE DETECTED   Tetrahydrocannabinol NONE DETECTED NONE DETECTED   Barbiturates NONE DETECTED NONE DETECTED   Comprehensive metabolic panel     Status: Abnormal   Collection Time: 03/06/15  4:00 AM  Result Value Ref Range   Sodium 139 135 - 145 mmol/L   Potassium 4.2 3.5 - 5.1 mmol/L   Chloride 110 101 - 111 mmol/L   CO2 23 22 - 32 mmol/L   Glucose, Bld 155 (H) 65 - 99 mg/dL   BUN 20 6 - 20 mg/dL   Creatinine, Ser 1.27 (H) 0.61 - 1.24 mg/dL   Calcium 8.6 (L) 8.9 - 10.3 mg/dL   Total Protein 6.4 (L) 6.5 - 8.1 g/dL   Albumin 3.3 (L) 3.5 - 5.0 g/dL   AST 22 15 - 41 U/L   ALT 15 (L) 17 - 63 U/L   Alkaline Phosphatase 57 38 - 126 U/L   Total Bilirubin 0.4 0.3 - 1.2 mg/dL   GFR calc non Af Amer 58 (L) >60 mL/min   GFR calc Af Amer >60 >60 mL/min   Anion gap 6 5 - 15  Lactic acid, plasma     Status: None   Collection Time: 03/06/15  4:00 AM  Result Value Ref Range   Lactic Acid, Venous 1.9 0.5 - 2.0 mmol/L  Magnesium     Status: None   Collection Time: 03/06/15  4:00 AM  Result Value Ref Range   Magnesium 1.7 1.7 - 2.4 mg/dL  Troponin I     Status: None   Collection Time: 03/06/15  4:00 AM  Result Value Ref Range   Troponin I <0.03 <0.031 ng/mL  Glucose, capillary     Status: Abnormal   Collection Time: 03/06/15  7:52 AM  Result Value Ref Range   Glucose-Capillary 154 (H) 65 - 99 mg/dL  Troponin I     Status: None   Collection Time: 03/06/15 10:04 AM  Result Value Ref Range   Troponin I <0.03 <0.031 ng/mL    Signed: Charlesetta Shanks, MD 03/06/2015, 11:54 AM    Services Ordered  on Discharge: Inpatient Substance Treatment Program. Equipment Ordered on Discharge: None.

## 2015-03-06 NOTE — Discharge Instructions (Signed)
Near-Syncope Near-syncope (commonly known as near fainting) is sudden weakness, dizziness, or feeling like you might pass out. During an episode of near-syncope, you may also develop pale skin, have tunnel vision, or feel sick to your stomach (nauseous). Near-syncope may occur when getting up after sitting or while standing for a long time. It is caused by a sudden decrease in blood flow to the brain. This decrease can result from various causes or triggers, most of which are not serious. However, because near-syncope can sometimes be a sign of something serious, a medical evaluation is required. The specific cause is often not determined. HOME CARE INSTRUCTIONS  Monitor your condition for any changes. The following actions may help to alleviate any discomfort you are experiencing:  Have someone stay with you until you feel stable.  Lie down right away and prop your feet up if you start feeling like you might faint. Breathe deeply and steadily. Wait until all the symptoms have passed. Most of these episodes last only a few minutes. You may feel tired for several hours.   Drink enough fluids to keep your urine clear or pale yellow.   If you are taking blood pressure or heart medicine, get up slowly when seated or lying down. Take several minutes to sit and then stand. This can reduce dizziness.  Follow up with your health care provider as directed. SEEK IMMEDIATE MEDICAL CARE IF:   You have a severe headache.   You have unusual pain in the chest, abdomen, or back.   You are bleeding from the mouth or rectum, or you have black or tarry stool.   You have an irregular or very fast heartbeat.   You have repeated fainting or have seizure-like jerking during an episode.   You faint when sitting or lying down.   You have confusion.   You have difficulty walking.   You have severe weakness.   You have vision problems.  MAKE SURE YOU:   Understand these instructions.  Will  watch your condition.  Will get help right away if you are not doing well or get worse. Document Released: 09/23/2005 Document Revised: 09/28/2013 Document Reviewed: 02/26/2013 [facility] Patient Information 2015 [place], [place]. This information is not intended to replace advice given to you by your health care provider. Make sure you discuss any questions you have with your health care provider.  Weakness Weakness is a lack of strength. It may be felt all over the body (generalized) or in one specific part of the body (focal). Some causes of weakness can be serious. You may need further medical evaluation, especially if you are elderly or you have a history of immunosuppression (such as chemotherapy or HIV), kidney disease, heart disease, or diabetes. CAUSES  Weakness can be caused by many different things, including:  Infection.  Physical exhaustion.  Internal bleeding or other blood loss that results in a lack of red blood cells (anemia).  Dehydration. This cause is more common in elderly people.  Side effects or electrolyte abnormalities from medicines, such as pain medicines or sedatives.  Emotional distress, anxiety, or depression.  Circulation problems, especially severe peripheral arterial disease.  Heart disease, such as rapid atrial fibrillation, bradycardia, or heart failure.  Nervous system disorders, such as Guillain-Barr syndrome, multiple sclerosis, or stroke. DIAGNOSIS  To find the cause of your weakness, your caregiver will take your history and perform a physical exam. Lab tests or X-rays may also be ordered, if needed. TREATMENT  Treatment of weakness depends on  the cause of your symptoms and can vary greatly. HOME CARE INSTRUCTIONS   Rest as needed.  Eat a well-balanced diet.  Try to get some exercise every day.  Only take over-the-counter or prescription medicines as directed by your caregiver. SEEK MEDICAL CARE IF:   Your weakness seems to be getting  worse or spreads to other parts of your body.  You develop new aches or pains. SEEK IMMEDIATE MEDICAL CARE IF:   You cannot perform your normal daily activities, such as getting dressed and feeding yourself.  You cannot walk up and down stairs, or you feel exhausted when you do so.  You have shortness of breath or chest pain.  You have difficulty moving parts of your body.  You have weakness in only one area of the body or on only one side of the body.  You have a fever.  You have trouble speaking or swallowing.  You cannot control your bladder or bowel movements.  You have black or bloody vomit or stools. MAKE SURE YOU:  Understand these instructions.  Will watch your condition.  Will get help right away if you are not doing well or get worse. Document Released: 09/23/2005 Document Revised: 03/24/2012 Document Reviewed: 11/22/2011 [facility] Patient Information 2015 [place], [place]. This information is not intended to replace advice given to you by your health care provider. Make sure you discuss any questions you have with your health care provider.

## 2015-03-06 NOTE — ED Notes (Signed)
[facility] called to transport pt spoke with Andee Poles

## 2015-03-06 NOTE — Discharge Instructions (Signed)
Thank you for allowing [place] to be involved in your healthcare while you were hospitalized at [facility].   Please note that there have been changes to your home medications.  --> PLEASE LOOK AT YOUR DISCHARGE MEDICATION LIST FOR DETAILS.  Please call your PCP if you have any questions or concerns, or any difficulty getting any of your medications.  Please return to the ER if you have worsening of your symptoms or new severe symptoms arise.  Please stop taking glipizide and reduce your Lantus to 15 units daily.  Please have the workers at [facility] administer your metformin pills 12 hours apart to not cause any more hypoglycemia.  Make sure to follow up with your PCP so your diabetes medications can be titrated.  Hypoglycemia Hypoglycemia occurs when the glucose in your blood is too low. Glucose is a type of sugar that is your body's main energy source. Hormones, such as insulin and glucagon, control the level of glucose in the blood. Insulin lowers blood glucose and glucagon increases blood glucose. Having too much insulin in your blood stream, or not eating enough food containing sugar, can result in hypoglycemia. Hypoglycemia can happen to people with or without diabetes. It can develop quickly and can be a medical emergency.  CAUSES  3. Missing or delaying meals. 4. Not eating enough carbohydrates at meals. 5. Taking too much diabetes medicine. 6. Not timing your oral diabetes medicine or insulin doses with meals, snacks, and exercise. 7. Nausea and vomiting. 8. Certain medicines. 9. Severe illnesses, such as hepatitis, kidney disorders, and certain eating disorders. 10. Increased activity or exercise without eating something extra or adjusting medicines. 11. Drinking too much alcohol. 12. A nerve disorder that affects body functions like your heart rate, blood pressure, and digestion (autonomic neuropathy). 13. A condition where the stomach muscles do not function  properly (gastroparesis). Therefore, medicines and food may not absorb properly. 14. Rarely, a tumor of the pancreas can produce too much insulin. SYMPTOMS   Hunger.  Sweating (diaphoresis).  Change in body temperature.  Shakiness.  Headache.  Anxiety.  Lightheadedness.  Irritability.  Difficulty concentrating.  Dry mouth.  Tingling or numbness in the hands or feet.  Restless sleep or sleep disturbances.  Altered speech and coordination.  Change in mental status.  Seizures or prolonged convulsions.  Combativeness.  Drowsiness (lethargic).  Weakness.  Increased heart rate or palpitations.  Confusion.  Pale, gray skin color.  Blurred or double vision.  Fainting. DIAGNOSIS  A physical exam and medical history will be performed. Your caregiver may make a diagnosis based on your symptoms. Blood tests and other lab tests may be performed to confirm a diagnosis. Once the diagnosis is made, your caregiver will see if your signs and symptoms go away once your blood glucose is raised.  TREATMENT  Usually, you can easily treat your hypoglycemia when you notice symptoms. 6. Check your blood glucose. If it is less than 70 mg/dl, take one of the following:  1. 3-4 glucose tablets.  2.  cup juice.  3.  cup regular soda.  4. 1 cup skim milk.  5. -1 tube of glucose gel.  6. 5-6 hard candies.  7. Avoid high-fat drinks or food that may delay a rise in blood glucose levels. 8. Do not take more than the recommended amount of sugary foods, drinks, gel, or tablets. Doing so will cause your blood glucose to go too high.  9. Wait 10-15 minutes and recheck your blood  glucose. If it is still less than 70 mg/dl or below your target range, repeat treatment.  10. Eat a snack if it is more than 1 hour until your next meal.  There may be a time when your blood glucose may go so low that you are unable to treat yourself at home when you start to notice symptoms. You may  need someone to help you. You may even faint or be unable to swallow. If you cannot treat yourself, someone will need to bring you to the hospital.  [place]  If you have diabetes, follow your diabetes management plan by:  Taking your medicines as directed.  Following your exercise plan.  Following your meal plan. Do not skip meals. Eat on time.  Testing your blood glucose regularly. Check your blood glucose before and after exercise. If you exercise longer or different than usual, be sure to check blood glucose more frequently.  Wearing your medical alert jewelry that says you have diabetes.  Identify the cause of your hypoglycemia. Then, develop ways to prevent the recurrence of hypoglycemia.  Do not take a hot bath or shower right after an insulin shot.  Always carry treatment with you. Glucose tablets are the easiest to carry.  If you are going to drink alcohol, drink it only with meals.  Tell friends or family members ways to keep you safe during a seizure. This may include removing hard or sharp objects from the area or turning you on your side.  Maintain a healthy weight. SEEK MEDICAL CARE IF:   You are having problems keeping your blood glucose in your target range.  You are having frequent episodes of hypoglycemia.  You feel you might be having side effects from your medicines.  You are not sure why your blood glucose is dropping so low.  You notice a change in vision or a new problem with your vision. SEEK IMMEDIATE MEDICAL CARE IF:   Confusion develops.  A change in mental status occurs.  The inability to swallow develops.  Fainting occurs. Document Released: 09/23/2005 Document Revised: 09/28/2013 Document Reviewed: 01/20/2012 [facility] Patient Information 2015 [place], [place]. This information is not intended to replace advice given to you by your health care provider. Make sure you discuss any questions you have with your health care  provider.

## 2015-03-06 NOTE — ED Notes (Signed)
NOTIFIED DR.POLLINA FOR PATIENTS LAB RESULTS OF CG4+LACTIC ACID @18 :23PM ,03/06/2015.

## 2015-03-06 NOTE — Progress Notes (Signed)
Report was called to Purvis Sheffield at [facility] on patient.

## 2015-03-06 NOTE — Discharge Planning (Signed)
Patient to return to [facility]. Patient updated regarding discharge.  Facility: [facility] RN report number: [phone] Transportation: [facility] to transport patient at 1pm. Requesting RN staff have patient ready and at Emergency Department entrance at [place], [place] [phone]) and [facility] [phone])

## 2015-03-06 NOTE — Progress Notes (Signed)
Subjective:    Currently, the patient reports feeling well. He reports having hypoglycemic and shaking episodes for the last week. His diabetes medications are administered by his inpatient substance abuse facility.  Interval Events: -Afebrile vital signs stable.  -Blood sugars running in the 150s last night.    Objective:    Vital Signs:   Temp:  [97.5 F (36.4 C)-98.3 F (36.8 C)] 98 F (36.7 C) (05/30 0507) Pulse Rate:  [79-127] 98 (05/30 0507) Resp:  [17-23] 18 (05/30 0507) BP: (112-142)/(70-88) 112/70 mmHg (05/30 0507) SpO2:  [98 %-100 %] 98 % (05/30 0507) Weight:  [173 lb (78.472 kg)-179 lb 3.7 oz (81.3 kg)] 179 lb 3.7 oz (81.3 kg) (05/30 0507) Last BM Date: 03/06/15  24-hour weight change: Weight change:   Intake/Output:   Intake/Output Summary (Last 24 hours) at 03/06/15 1107 Last data filed at 03/06/15 1104  Gross per 24 hour  Intake   1856 ml  Output    700 ml  Net   1156 ml      Physical Exam: General: Well-developed, well-nourished, in no acute distress; alert, appropriate and cooperative throughout examination.  Lungs:  Normal respiratory effort. Clear to auscultation BL without crackles or wheezes.  Heart: Irregular. S1 and S2 normal without gallop, murmur, or rubs.  Abdomen:  BS normoactive. Soft, Nondistended, non-tender.  No masses or organomegaly.  Extremities: No pretibial edema.     Labs:  Basic Metabolic Panel:  Recent Labs Lab 03/05/15 2247 03/06/15 0400  NA 139 139  K 4.8 4.2  CL 103 110  CO2 25 23  GLUCOSE 143* 155*  BUN 25* 20  CREATININE 1.44* 1.27*  CALCIUM 9.7 8.6*  MG  --  1.7    Liver Function Tests:  Recent Labs Lab 03/06/15 0400  AST 22  ALT 15*  ALKPHOS 57  BILITOT 0.4  PROT 6.4*  ALBUMIN 3.3*   CBC:  Recent Labs Lab 03/05/15 2247  WBC 4.3  HGB 12.8*  HCT 38.7*  MCV 83.8  PLT 190    Cardiac Enzymes:  Recent Labs Lab 03/05/15 2247 03/06/15 0400 03/06/15 1004  TROPONINI <0.03 <0.03 <0.03      CBG:  Recent Labs Lab 03/05/15 2258 03/05/15 2321 03/06/15 0004 03/06/15 0345 03/06/15 0752  GLUCAP 111* 147* 183* 153* 154*    Microbiology: Results for orders placed or performed during the hospital encounter of 08/13/14  Urine culture     Status: None   Collection Time: 08/13/14  9:14 PM  Result Value Ref Range Status   Specimen Description URINE, CLEAN CATCH  Final   Special Requests NONE  Final   Culture  Setup Time   Final    08/14/2014 01:22 Performed at [place] Performed at [facility]   Final   Culture NO GROWTH Performed at [facility]   Final   Report Status 08/15/2014 FINAL  Final   Imaging: Dg Chest 2 View  03/06/2015   CLINICAL DATA:  Acute onset of hypoglycemia.  Initial encounter.  EXAM: CHEST  2 VIEW  COMPARISON:  Chest radiograph performed 12/10/2014  FINDINGS: The lungs are well-aerated and clear. There is no evidence of focal opacification, pleural effusion or pneumothorax.  The heart is normal in size; the mediastinal contour is within normal limits. No acute osseous abnormalities are seen.  IMPRESSION: No acute cardiopulmonary process seen.   Electronically Signed   By: Garald Balding M.D.   On: 03/06/2015  01:04       Medications:    Infusions: . sodium chloride      Scheduled Medications: . allopurinol  200 mg Oral Daily  . aspirin EC  81 mg Oral Daily  . clopidogrel  75 mg Oral Daily  . DULoxetine  60 mg Oral Daily  . emtricitabine-tenofovir  1 tablet Oral Daily   And  . rilpivirine  25 mg Oral Daily  . insulin glargine  15 Units Subcutaneous Daily  . isosorbide mononitrate  30 mg Oral Daily  . pravastatin  40 mg Oral Daily  . QUEtiapine  50 mg Oral QHS  . sodium chloride  3 mL Intravenous Q12H    PRN Medications: acetaminophen **OR** acetaminophen, albuterol, ipratropium-albuterol   Assessment/ Plan:    Principal Problem:   Lactic acidosis Active Problems:   HIV  (human immunodeficiency virus infection)   Uncontrolled type 2 diabetes with neuropathy   Hyperlipidemia with target LDL less than 100   Hypertension goal BP (blood pressure) < 140/80   3-vessel CAD   Asthma, chronic   Cocaine use disorder, severe, dependence   Substance or medication-induced depressive disorder with onset during withdrawal   CKD (chronic kidney disease) stage 3, GFR 30-59 ml/min   Hypoglycemia   Normocytic anemia  #Hypoglycemia The patient was having hypoglycemic episodes prior to having glipizide added. His hypoglycemia usually occurs in the morning, likely secondary to Lantus dose being too high. We will back off on his diabetes medications and titrate back up slowly as an outpatient. -Continue to hold home metformin 1000 mg twice a day, restart at discharge. -Restart Lantus at 15 units daily. -Continue CBGs every 4 hours. -Discontinue glipizide. -Discharge back to [facility] today if spot still available.  #Elevated lactic acid Now resolved. No indication to check first place. No signs of hypoperfusion. Potentially related to dosing of metformin to close together versus HIV medications. Troponins negative 3. UDS negative. -No further workup. -Stop IV fluids.  #Chest pain in the setting of CAD EKG and troponin with no evidence of ischemia. -Continue aspirin 81 mg daily and Plavix 75 mg daily. -Continue pravastatin 40 mg daily. -We'll have patient follow-up with his cardiologist as an outpatient. -Continue Tylenol 650 mg every 6 hours when necessary  #HIV -Continue Complera 200-25-300 mg 1 tab daily. -Follow-up with infectious disease as scheduled.  #Hypertension Normotensive this morning. Continue Imdur 30 mg daily, lisinopril 10 mg daily.  #Asthma -Continue duo nebs every 4 hours when necessary. -Continue albuterol inhaler every 6 hours as needed.  #Chronic kidney disease, stage III Creatinine at baseline. -Continue to monitor as  outpatient.  #Normocytic anemia Hemoglobin at baseline. -Outpatient workup as indicated.  #Gout -Continue home help urine on 2 mg daily.  #Depression -Continue home duloxetine 60 mg daily. -Continue Seroquel 50 mg daily at bedtime.   DVT PPX - SCD's while in bed  CODE STATUS - Full.  CONSULTS PLACED - None.  DISPO - Discharge back to [facility] today if possible..   The patient does have a current PCP Hulen Luster, Beverlee Nims, MD) and does need an [facility] hospital follow-up appointment after discharge.    Is the [facility] hospital follow-up appointment a one-time only appointment? no.  Does the patient have transportation limitations that hinder transportation to clinic appointments? yes   SERVICE NEEDED AT [place]         Y = Yes, Blank = No PT:   OT:   RN:   Equipment:  Other:      Length of Stay:  day(s)   Signed: Charlesetta Shanks, MD  PGY-1, Internal Medicine Resident Pager: [phone] (7AM-5PM) 03/06/2015, 11:07 AM

## 2015-03-07 NOTE — Progress Notes (Signed)
Case discussed with Dr. Truong at time of visit. We reviewed the resident's history and exam and pertinent patient test results. I agree with the assessment, diagnosis, and plan of care documented in the resident's note. 

## 2015-03-08 IMAGING — CR DG CHEST 2V
2 series · 2 of 2 positions shown · non-contrast
Comparison: 01/07/2014

CLINICAL DATA: Chest pain, shortness of breath

EXAM:
CHEST  2 VIEW

[w chest pa]
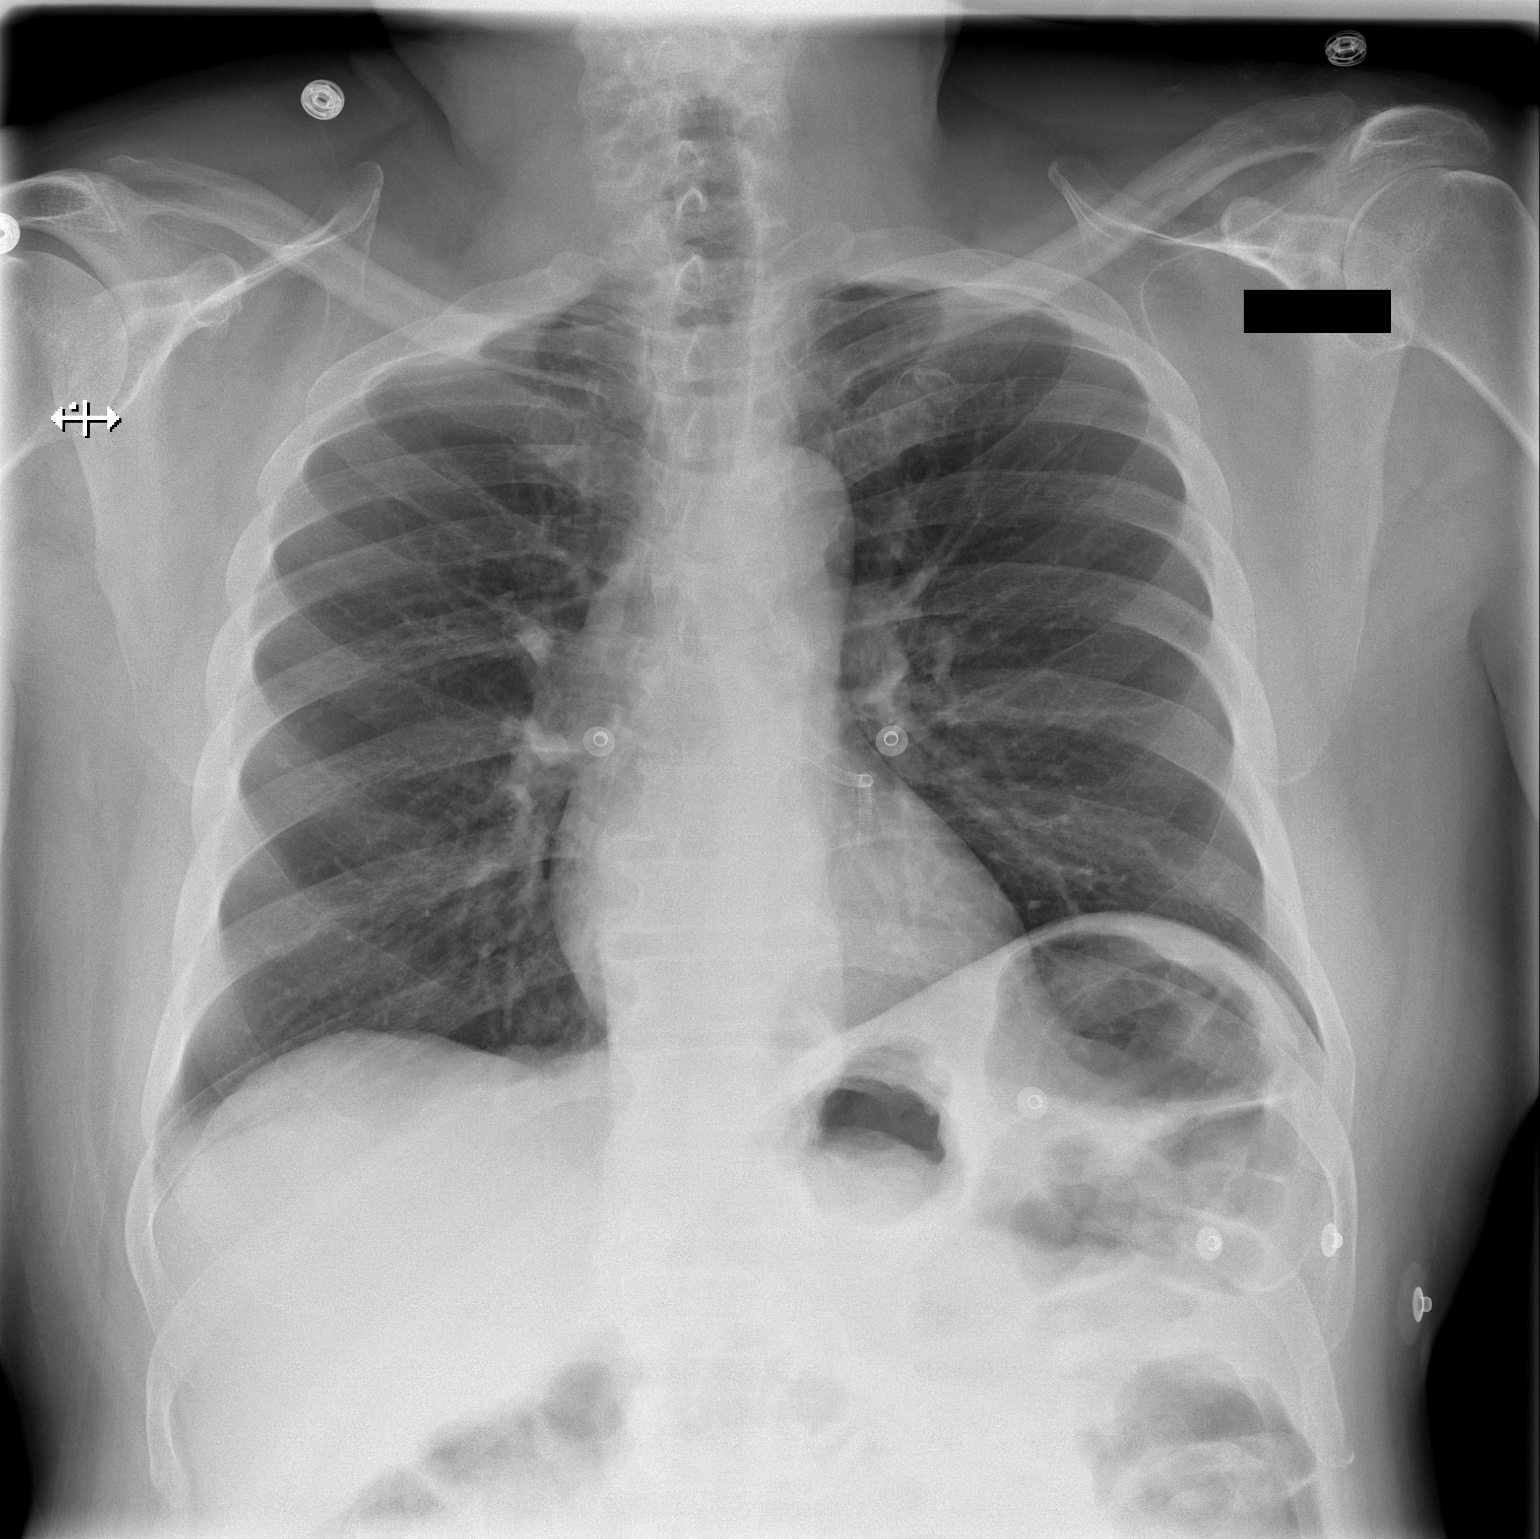

[w chest lat]
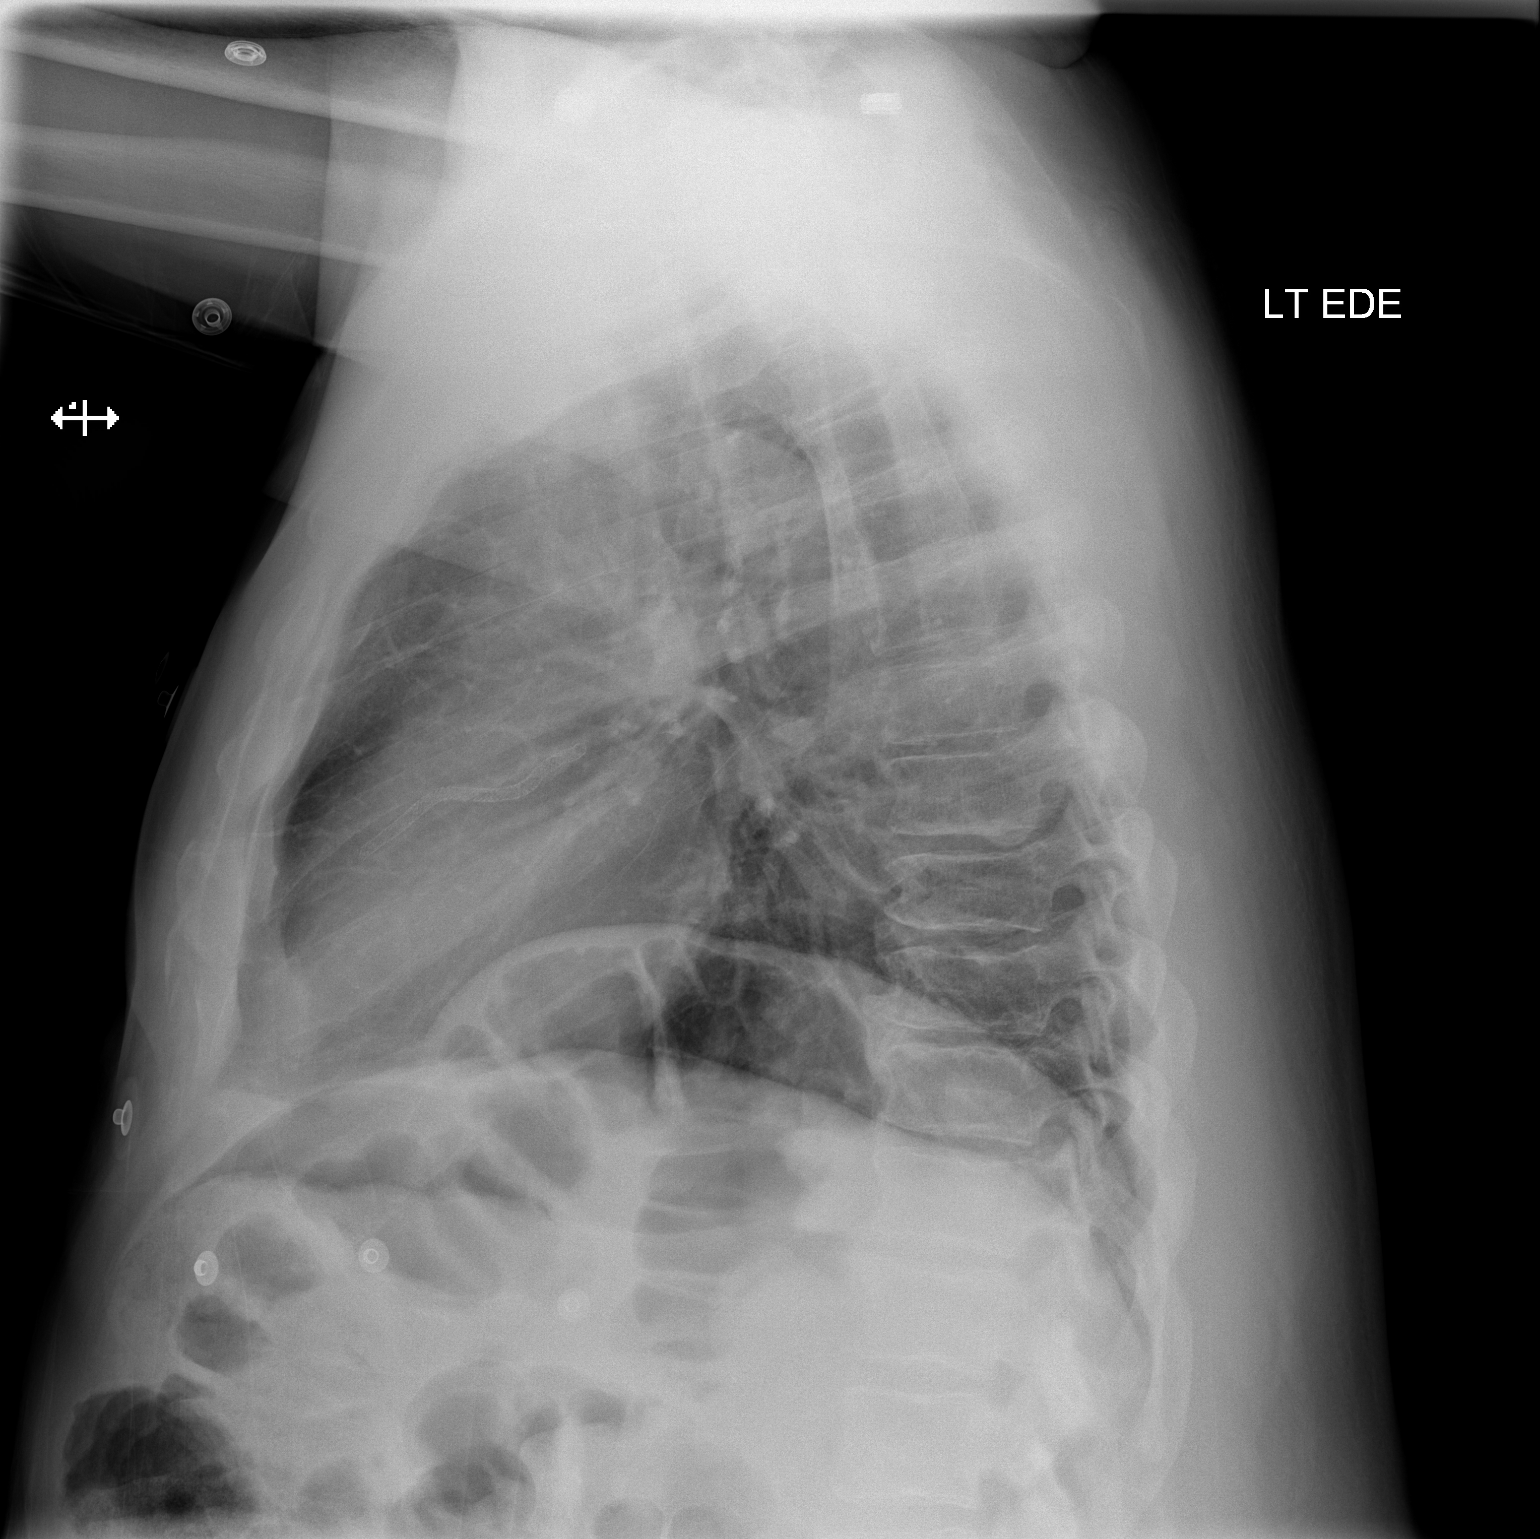

[2 of 2 positions shown; findings below may reference images not displayed]

FINDINGS: There is no focal parenchymal opacity, pleural effusion, or
pneumothorax. The heart and mediastinal contours are unremarkable.

There is mild degenerative disc disease of the lumbar spine.
IMPRESSION: No active cardiopulmonary disease.

## 2015-03-10 ENCOUNTER — Encounter: Payer: Self-pay | Admitting: Dietician

## 2015-03-20 ENCOUNTER — Ambulatory Visit (INDEPENDENT_AMBULATORY_CARE_PROVIDER_SITE_OTHER): Payer: Medicare Other | Admitting: Infectious Diseases

## 2015-03-20 ENCOUNTER — Encounter: Payer: Self-pay | Admitting: Infectious Diseases

## 2015-03-20 ENCOUNTER — Other Ambulatory Visit (HOSPITAL_COMMUNITY)
Admission: RE | Admit: 2015-03-20 | Discharge: 2015-03-20 | Disposition: A | Discharge status: Home / Self Care | Payer: Medicare Other | Source: Ambulatory Visit | Attending: Infectious Diseases | Admitting: Infectious Diseases

## 2015-03-20 ENCOUNTER — Encounter: Payer: Self-pay | Admitting: Pulmonary Disease

## 2015-03-20 ENCOUNTER — Ambulatory Visit (INDEPENDENT_AMBULATORY_CARE_PROVIDER_SITE_OTHER): Payer: Medicare Other | Admitting: Pulmonary Disease

## 2015-03-20 VITALS — Ht 67.5 in | Wt 176.0 lb

## 2015-03-20 VITALS — BP 105/62 | HR 78 | Temp 97.7°F | Ht 67.5 in | Wt 173.6 lb

## 2015-03-20 DIAGNOSIS — E1140 Type 2 diabetes mellitus with diabetic neuropathy, unspecified: Secondary | ICD-10-CM

## 2015-03-20 DIAGNOSIS — N183 Chronic kidney disease, stage 3 unspecified: Secondary | ICD-10-CM

## 2015-03-20 DIAGNOSIS — Z79899 Other long term (current) drug therapy: Secondary | ICD-10-CM | POA: Diagnosis not present

## 2015-03-20 DIAGNOSIS — Z23 Encounter for immunization: Secondary | ICD-10-CM

## 2015-03-20 DIAGNOSIS — I2510 Atherosclerotic heart disease of native coronary artery without angina pectoris: Secondary | ICD-10-CM

## 2015-03-20 DIAGNOSIS — E1165 Type 2 diabetes mellitus with hyperglycemia: Principal | ICD-10-CM

## 2015-03-20 DIAGNOSIS — Z113 Encounter for screening for infections with a predominantly sexual mode of transmission: Secondary | ICD-10-CM | POA: Insufficient documentation

## 2015-03-20 DIAGNOSIS — Z794 Long term (current) use of insulin: Secondary | ICD-10-CM | POA: Diagnosis not present

## 2015-03-20 DIAGNOSIS — B20 Human immunodeficiency virus [HIV] disease: Secondary | ICD-10-CM

## 2015-03-20 DIAGNOSIS — Z21 Asymptomatic human immunodeficiency virus [HIV] infection status: Secondary | ICD-10-CM | POA: Diagnosis not present

## 2015-03-20 DIAGNOSIS — IMO0002 Reserved for concepts with insufficient information to code with codable children: Secondary | ICD-10-CM

## 2015-03-20 LAB — COMPREHENSIVE METABOLIC PANEL
ALK PHOS: 63 U/L (ref 39–117)
ALT: 12 U/L (ref 0–53)
AST: 17 U/L (ref 0–37)
Albumin: 4.2 g/dL (ref 3.5–5.2)
BUN: 19 mg/dL (ref 6–23)
CALCIUM: 9.6 mg/dL (ref 8.4–10.5)
CO2: 25 mEq/L (ref 19–32)
Chloride: 105 mEq/L (ref 96–112)
Creat: 1.29 mg/dL (ref 0.50–1.35)
Glucose, Bld: 121 mg/dL — ABNORMAL HIGH (ref 70–99)
Potassium: 4.4 mEq/L (ref 3.5–5.3)
Sodium: 140 mEq/L (ref 135–145)
Total Bilirubin: 0.3 mg/dL (ref 0.2–1.2)
Total Protein: 7.2 g/dL (ref 6.0–8.3)

## 2015-03-20 LAB — CBC
HCT: 36.7 % — ABNORMAL LOW (ref 39.0–52.0)
Hemoglobin: 12.1 g/dL — ABNORMAL LOW (ref 13.0–17.0)
MCH: 27.9 pg (ref 26.0–34.0)
MCHC: 33 g/dL (ref 30.0–36.0)
MCV: 84.6 fL (ref 78.0–100.0)
MPV: 9.9 fL (ref 8.6–12.4)
Platelets: 159 10*3/uL (ref 150–400)
RBC: 4.34 MIL/uL (ref 4.22–5.81)
RDW: 15.2 % (ref 11.5–15.5)
WBC: 3.1 10*3/uL — ABNORMAL LOW (ref 4.0–10.5)

## 2015-03-20 LAB — LIPID PANEL
CHOL/HDL RATIO: 2.6 ratio
CHOLESTEROL: 121 mg/dL (ref 0–200)
HDL: 47 mg/dL (ref >=40)
LDL Cholesterol: 53 mg/dL (ref 0–99)
TRIGLYCERIDES: 105 mg/dL (ref <150)
VLDL: 21 mg/dL (ref 0–40)

## 2015-03-20 LAB — GLUCOSE, CAPILLARY: Glucose-Capillary: 101 mg/dL — ABNORMAL HIGH (ref 65–99)

## 2015-03-20 LAB — RPR

## 2015-03-20 MED ORDER — CLOPIDOGREL BISULFATE 75 MG PO TABS: 75.0000 mg | ORAL_TABLET | Freq: Every day | ORAL | Status: DC

## 2015-03-20 NOTE — Assessment & Plan Note (Signed)
Will recheck his Cr today

## 2015-03-20 NOTE — Assessment & Plan Note (Signed)
Will continue his current meds Check his labs today.  He is given condoms.   Restart Hep B series Continue to space from PPI Will see him back in 6 months.

## 2015-03-20 NOTE — Patient Instructions (Signed)
May take PM medications at [facility]. Continue to take AM medications at 7AM.  Please continue to check blood sugar 3 times a day. 2 weeks prior to your next appointment, check your blood sugar 4 times a day (before meals and at bedtime) and bring this log to the appointment.  Progress Toward Treatment Goals:  Treatment Goal 03/20/2015  Hemoglobin A1C unable to assess  Blood pressure at goal    Self Care Goals & Plans:  Self Care Goal 03/20/2015  Manage my medications -  Monitor my health keep track of my blood glucose  Eat healthy foods eat baked foods instead of fried foods; eat more vegetables; eat fruit for snacks and desserts; drink diet soda or water instead of juice or soda  Be physically active find an activity I enjoy  Meeting treatment goals -    Home Blood Glucose Monitoring 03/20/2015  Check my blood sugar 4 times a day  When to check my blood sugar before meals; at bedtime

## 2015-03-20 NOTE — Addendum Note (Signed)
Addended by: Landis Gandy on: 03/20/2015 10:50 AM   Modules accepted: Orders

## 2015-03-20 NOTE — Progress Notes (Signed)
Subjective:   Patient ID: Michael [name], male    DOB: 10-03-1951, 64 y.o.   MRN: [id number]  HPI Michael [name] is a 64 year old man with history of diabetes, hypertension, hyperlipidemia, gout, CAD presenting for follow-up.  He was hospitalized from 03/05/2015-03/06/2015 for hypoglycemia. His Lantus was decreased from 27 units to 15 units daily at bedtime. He was continued on metformin 1000 mg twice a day.  He denies episodes of hypoglycemia since then. He reports he still has his chronic chest pain that is intermittent. It is located in his left chest. It is a sharp pain that worsens with movement. Denies nausea or diaphoresis with the episodes. He has some shortness of breath with the chest pain. Denies LE edema. He has a cardiology appointment in August.   Review of Systems Constitutional: no fevers/chills Eyes: no vision changes Ears, nose, mouth, throat, and face: no cough Respiratory: occasional shortness of breath Cardiovascular: occasional chest pain Gastrointestinal: no nausea/vomiting, no abdominal pain, no constipation, no diarrhea Genitourinary: no dysuria, no hematuria Integument: no rash Hematologic/lymphatic: no bleeding/bruising, no edema Musculoskeletal: no arthralgias, no myalgias Neurological: no paresthesias, no weakness  Past Medical History  Diagnosis Date  . Diabetes mellitus     Diagnosed in 2002, started insulin in 2012  . Hypertension   . Headache(784.0)     CT head 08/2011: Periventricular and subcortical white matter hypodensities are most in keeping with chronic microangiopathic change  . Gout   . Hyperlipidemia   . HIV infection Nov 2012    Followed by [name]  . Crack cocaine use     for 20+ years, has been enrolled in detox programs in the past  . Chondromalacia of medial femoral condyle     Left knee MRI 04/28/12: Chondromalacia of the medial femoral condyle with slight peripheral degeneration of the meniscocapsular junction of the  medial meniscus; followed by sports medicine  . Asthma     No PFTs, history of childhood asthma  . Depression     with history of hospitalization for suicidal ideation  . CAD (coronary artery disease)     a. 06/2013 STEMI/PCI ([facility]): LAD w/ thrombus (treated with BMS), mid 75%, D2 75%; LCX OM2 75%; RCA small, PDA 95%, PLV 95%;  b. 10/2013 Cath/PCI: ISR w/in LAD (Promus DES x 2), borderline OM2 lesion;  c. 01/2014 MV: Intermediate risk, medium-sized distal ant wall infarct w/ very small amt of peri-infarct ischemia. EF 60%.  . Gout 04/28/2012  . Collagen vascular disease   . Cellulitis 04/2014    left facial    Current Outpatient Prescriptions on File Prior to Visit  Medication Sig Dispense Refill  . albuterol (PROVENTIL HFA;VENTOLIN HFA) 108 (90 BASE) MCG/ACT inhaler Inhale 2 puffs into the lungs every 6 (six) hours as needed for wheezing or shortness of breath. 1 Inhaler 10  . allopurinol (ZYLOPRIM) 100 MG tablet Take 2 tablets (200 mg total) by mouth daily. For gouty arthritis    . aspirin EC 81 MG tablet Take 1 tablet (81 mg total) by mouth daily. For heart health    . clopidogrel (PLAVIX) 75 MG tablet Take 1 tablet (75 mg total) by mouth daily. For prevention of stroke    . COMPLERA 200-25-300 MG TABS TAKE 1 TABLET BY MOUTH DAILY. 30 tablet 3  . diclofenac sodium (VOLTAREN) 1 % GEL APPLY TWO   GMS TOPICALLY TWO   TIMES DAILY AS NEEDED (PAIN). 100 g 1  . DULoxetine (CYMBALTA) 60 MG capsule  TAKE 1 CAPSULE   BY MOUTH   DAILY 30 capsule 6  . famotidine (PEPCID) 20 MG tablet Take 20 mg by mouth daily. 8pm    . ibuprofen (ADVIL,MOTRIN) 200 MG tablet Take 200 mg by mouth every 6 (six) hours as needed for moderate pain.    . Insulin Glargine (LANTUS SOLOSTAR) 100 UNIT/ML Solostar Pen Inject 15 Units into the skin at bedtime. 15 pen 5  . ipratropium-albuterol (DUONEB) 0.5-2.5 (3) MG/3ML SOLN Take 3 mLs by nebulization every 4 (four) hours as needed (shortness of breath). 360 mL   . isosorbide  mononitrate (IMDUR) 30 MG 24 hr tablet Take 1 tablet (30 mg total) by mouth daily. For chest pain 30 tablet 6  . lisinopril (PRINIVIL,ZESTRIL) 10 MG tablet Take 1 tablet (10 mg total) by mouth daily. For high blood pressure    . metFORMIN (GLUCOPHAGE) 1000 MG tablet Take 1 tablet (1,000 mg total) by mouth 2 (two) times daily with a meal. For diabetes management    . pravastatin (PRAVACHOL) 40 MG tablet Take 1 tablet (40 mg total) by mouth daily. For high cholesterol 30 tablet   . QUEtiapine (SEROQUEL) 50 MG tablet Take 1 tablet (50 mg total) by mouth at bedtime. For mood control (Patient not taking: Reported on 03/20/2015) 30 tablet 0   No current facility-administered medications on file prior to visit.    Today's Vitals   03/20/15 1104  BP: 105/62  Pulse: 78  Temp: 97.7 F (36.5 C)  TempSrc: Oral  Height: 5' 7.5" (1.715 m)  Weight: 173 lb 9.6 oz (78.744 kg)  SpO2: 100%  PainSc: 4   PainLoc: Chest   Objective:  Physical Exam  Constitutional: He is oriented to person, place, and time. He appears well-developed and well-nourished.  HENT:  Head: Normocephalic and atraumatic.  Eyes: Conjunctivae are normal.  Neck: Neck supple.  Cardiovascular: Normal rate and regular rhythm.   Pulmonary/Chest: Effort normal and breath sounds normal.  Abdominal: Soft. Bowel sounds are normal.  Musculoskeletal: Normal range of motion.  Neurological: He is alert and oriented to person, place, and time.  Skin: Skin is warm and dry.  Psychiatric: He has a normal mood and affect.     Assessment & Plan:  Please refer to problem based charting.

## 2015-03-20 NOTE — Assessment & Plan Note (Signed)
He had (-) blood work at recent hospitalization.  He has an odd almost bigeminy rythym. This was seen on his previous ECG Will have him f/u with [facility], my great appreciation to them

## 2015-03-20 NOTE — Assessment & Plan Note (Signed)
His FSG are variable, will have him seen in IM Will get him into ophtho Will get him into podiatry

## 2015-03-20 NOTE — Progress Notes (Signed)
   Subjective:    Patient ID: Michael [name], male    DOB: 22-Feb-1951, 64 y.o.   MRN: [id number]  HPI 64 yo M with hx of DM2, HIV+ (previously on Complera), recent re-stent to LAD January 2015 after STEMI (06-2013).   Was in hospital last month with hypoglycemia, chest pain.  States his FSG have been pretty good  Staying at [facility] after crack cocaine episode.  Has been taking complera and also pepcid but spaces them by 11 hours.  Has had normal AM FSG but his mid-day and evening FSG have been 100 to < 300. Vision gets blurry, he feels off balance and he feels shaky. He does not have sliding scale. Feels like his breathing is diferent as well, chest is tight.    HIV 1 RNA QUANT (copies/mL)  Date Value  09/06/2014 83*  06/25/2014 24018*  02/03/2014 <20   CD4 T CELL ABS (/uL)  Date Value  09/06/2014 570  06/25/2014 380*  01/28/2014 270*   No  CV f/u.  No ophtho this year.   Review of Systems  Neurological: Positive for numbness.   See above.     Objective:   Physical Exam  Constitutional: He appears well-developed and well-nourished.  HENT:  Mouth/Throat: No oropharyngeal exudate.  Eyes: EOM are normal. Pupils are equal, round, and reactive to light.  Neck: Neck supple.  Cardiovascular: Normal rate and normal heart sounds.  An irregular rhythm present.  Pulmonary/Chest: Effort normal and breath sounds normal.  Abdominal: Soft. Bowel sounds are normal. He exhibits no distension. There is no tenderness.  Musculoskeletal: He exhibits no edema.  Lymphadenopathy:    He has no cervical adenopathy.  Skin:  Onychomycosis, no diabetic foot lesions.           Assessment & Plan:

## 2015-03-20 NOTE — Progress Notes (Signed)
Patient ID: Michael [name], male   DOB: Feb 04, 1951, 64 y.o.   MRN: [id number] Patient is scheduled for appointment with his primary care doctor this morning at 10:45.  RN reminded him of this appointment, sent him over after he was finished at [facility]. RN contacted patient's cardiologist, Dr. Sallyanne Kuster. Patient is scheduled for follow up 8/12 at 9:45.  Pt aware of this appointment. RN contacted patient's ophthalmologist, Dr. Clent Jacks. He is already scheduled for follow up Thursday 6/16. RN referred patient via EPIC to [place] for assessment. They will contact the patient to schedule him. Landis Gandy, RN

## 2015-03-21 LAB — T-HELPER CELL (CD4) - (RCID CLINIC ONLY)
CD4 % Helper T Cell: 35 % (ref 33–55)
CD4 T CELL ABS: 340 /uL — AB (ref 400–2700)

## 2015-03-21 LAB — HIV-1 RNA QUANT-NO REFLEX-BLD: HIV-1 RNA Quant, Log: <1.3 {Log} (ref <1.30)

## 2015-03-21 LAB — URINE CYTOLOGY ANCILLARY ONLY
Chlamydia: NEGATIVE
Neisseria Gonorrhea: NEGATIVE

## 2015-03-21 NOTE — Assessment & Plan Note (Signed)
Lab Results  Component Value Date   HGBA1C 8.4 03/03/2015   HGBA1C 8.2* 12/15/2014   HGBA1C 11.4* 05/01/2014     Assessment: Diabetes control: fair control Progress toward A1C goal:  unable to assess Comments: His measurements at [facility] are 80-118 at 700, 110-306 at 1600, 172-248 at 2100  Plan: Medications:  Continue insulin lantus 15u QHS, metformin 1000mg  BID Home glucose monitoring: Frequency: 4 times a day for two weeks prior to next follow up appointment Timing: before meals, at bedtime Other plans:  -Patient plans on staying in inpatient rehab for at least 2 years. He will discuss with the facility diabetic diet. -Follow up in 1.5 months

## 2015-03-21 NOTE — Assessment & Plan Note (Signed)
Refilled Plavix. Pt to follow up with cardiology.

## 2015-03-22 NOTE — Progress Notes (Signed)
Internal Medicine Clinic Attending  Case discussed with Dr. Krall at the time of the visit.  We reviewed the resident's history and exam and pertinent patient test results.  I agree with the assessment, diagnosis, and plan of care documented in the resident's note.  

## 2015-04-04 IMAGING — CR DG CHEST 2V
2 series · 2 of 2 positions shown · non-contrast
Comparison: Chest x-ray 01/27/2014.

CLINICAL DATA: Chest pain.

EXAM:
CHEST  2 VIEW

[w chest pa]
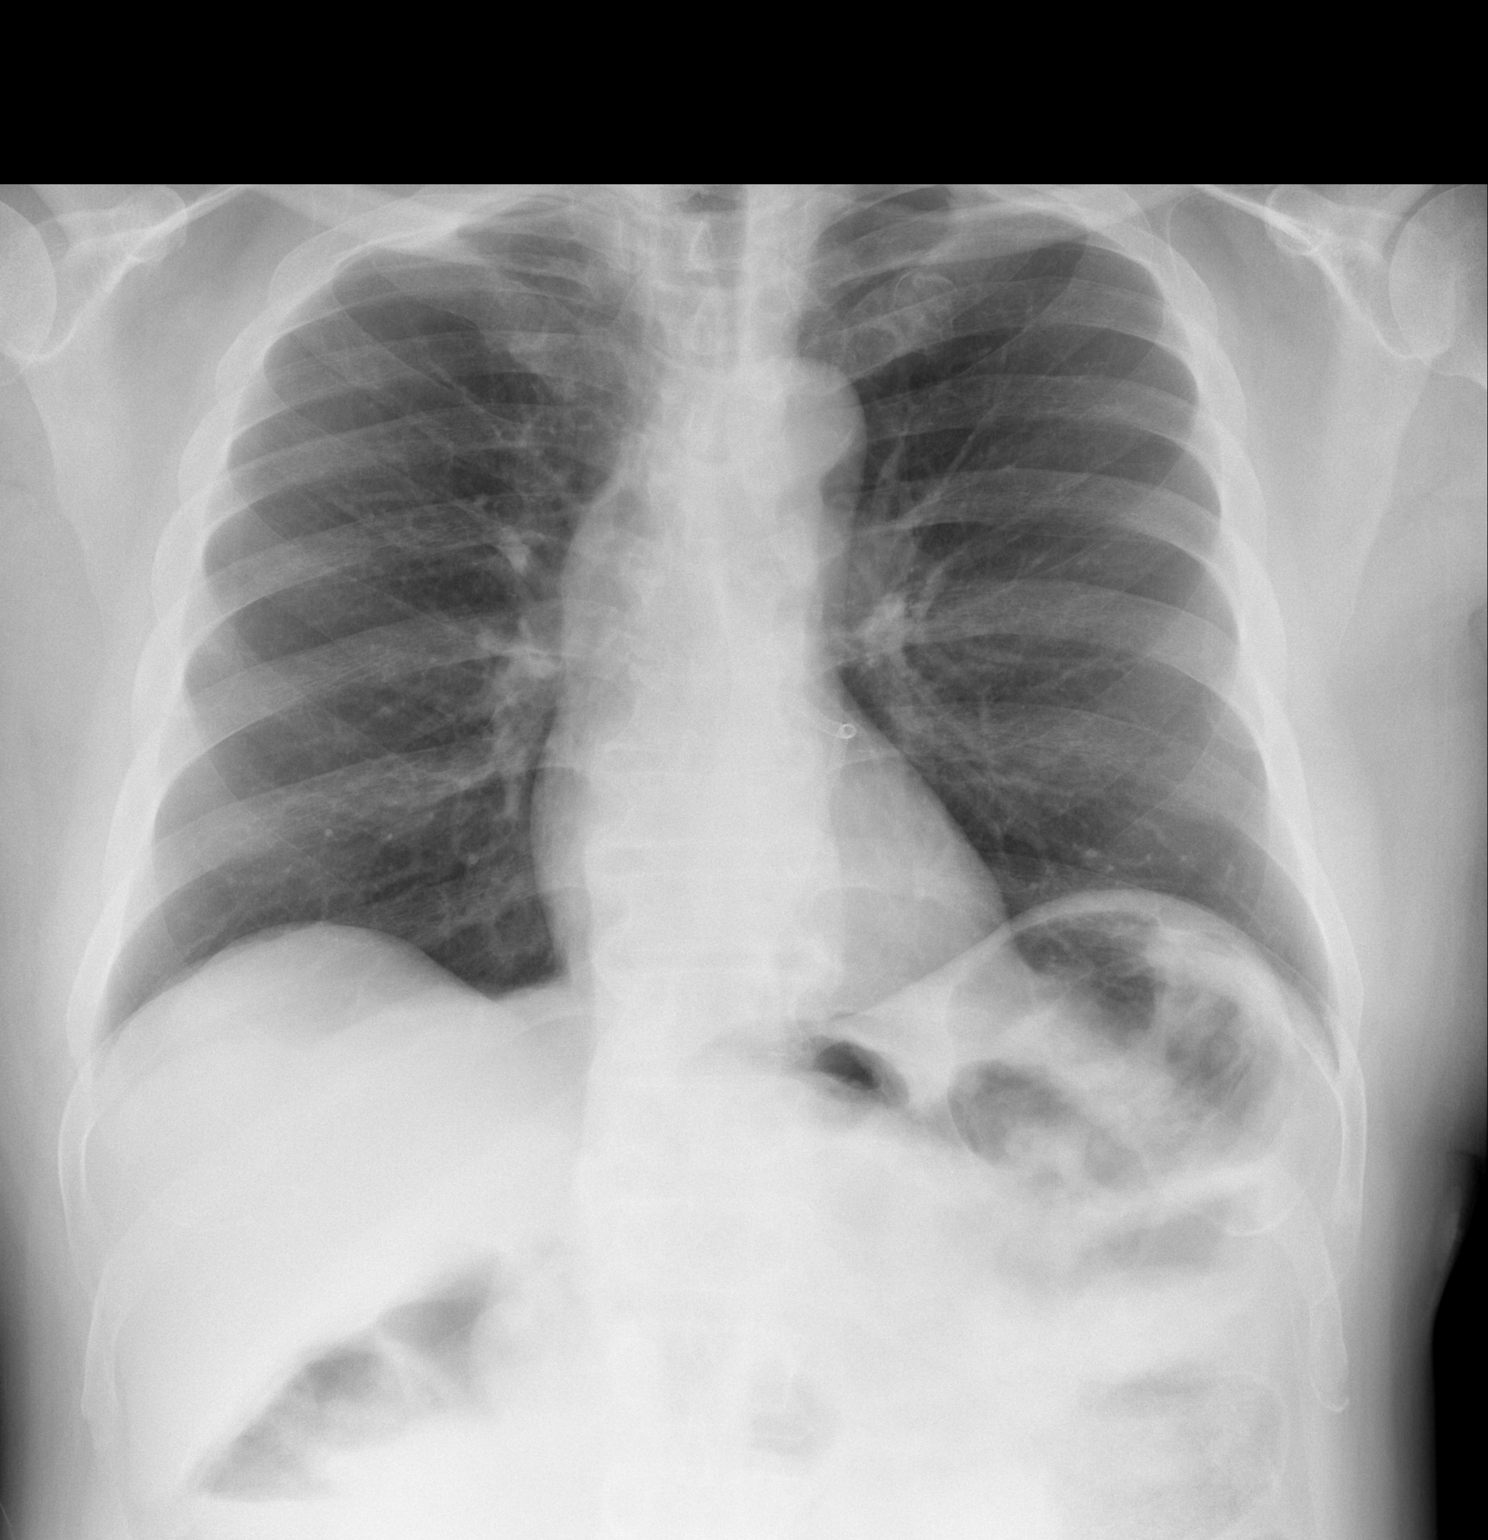

[w chest lat]
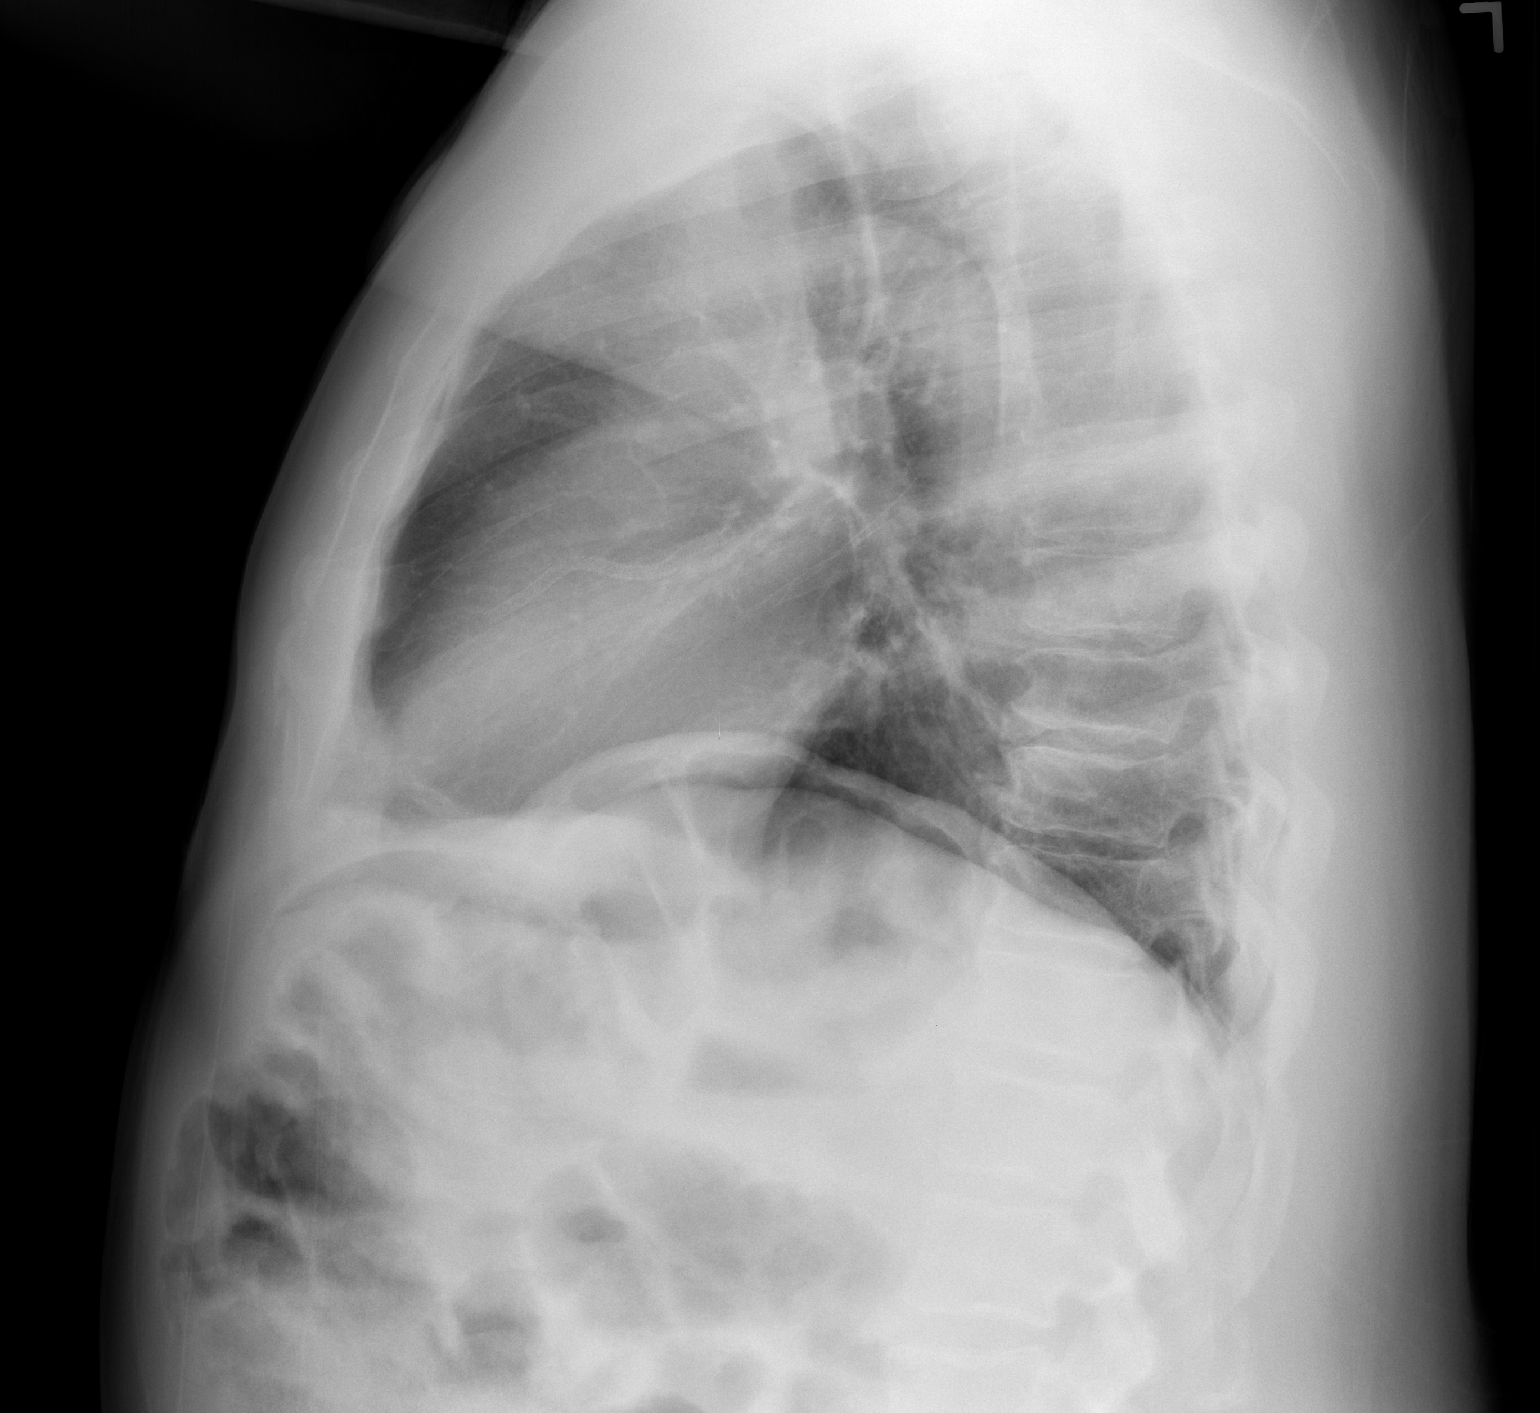

[2 of 2 positions shown; findings below may reference images not displayed]

FINDINGS: Lung volumes are normal. No consolidative airspace disease. No
pleural effusions. No pneumothorax. No pulmonary nodule or mass
noted. Pulmonary vasculature and the cardiomediastinal silhouette
are within normal limits. Atherosclerosis in the thoracic aorta.
Coronary artery stent noted, likely in the LAD territory.
IMPRESSION: 1. No radiographic evidence of acute cardiopulmonary disease.
2. Atherosclerosis.

## 2015-04-14 NOTE — Addendum Note (Signed)
Addended by: Hulan Fray on: 04/14/2015 07:15 PM   Modules accepted: Orders

## 2015-04-17 ENCOUNTER — Telehealth: Payer: Self-pay | Admitting: *Deleted

## 2015-04-17 NOTE — Telephone Encounter (Addendum)
Call from Legrand Como, Social Worker with [facility] inpatient [facility].  Phone # [phone]  He reports pt CBG this morning before breakfast as 96 so he held AM metformin. He is asking for a Sliding Scale so they know when to hold Metformin or Lantus.  Wanting guidelines.  It will have to be written and faxed to them.  Fax # - [phone]  Att: Dallie Dad His sugars seem to go up and down.  CBG is checked 4 times a day. Pt on Metformin 1,000 mg BID and Lantus at HS

## 2015-04-18 ENCOUNTER — Emergency Department (HOSPITAL_BASED_OUTPATIENT_CLINIC_OR_DEPARTMENT_OTHER)
Admission: EM | Admit: 2015-04-18 | Discharge: 2015-04-19 | Disposition: A | Discharge status: Home / Self Care | Payer: Medicare Other | Attending: Emergency Medicine | Admitting: Emergency Medicine

## 2015-04-18 ENCOUNTER — Encounter (HOSPITAL_BASED_OUTPATIENT_CLINIC_OR_DEPARTMENT_OTHER): Payer: Self-pay | Admitting: *Deleted

## 2015-04-18 DIAGNOSIS — I2510 Atherosclerotic heart disease of native coronary artery without angina pectoris: Secondary | ICD-10-CM | POA: Insufficient documentation

## 2015-04-18 DIAGNOSIS — R0789 Other chest pain: Secondary | ICD-10-CM | POA: Diagnosis not present

## 2015-04-18 DIAGNOSIS — E785 Hyperlipidemia, unspecified: Secondary | ICD-10-CM | POA: Insufficient documentation

## 2015-04-18 DIAGNOSIS — M109 Gout, unspecified: Secondary | ICD-10-CM | POA: Diagnosis not present

## 2015-04-18 DIAGNOSIS — Z79899 Other long term (current) drug therapy: Secondary | ICD-10-CM | POA: Diagnosis not present

## 2015-04-18 DIAGNOSIS — I10 Essential (primary) hypertension: Secondary | ICD-10-CM | POA: Insufficient documentation

## 2015-04-18 DIAGNOSIS — Z872 Personal history of diseases of the skin and subcutaneous tissue: Secondary | ICD-10-CM | POA: Insufficient documentation

## 2015-04-18 DIAGNOSIS — J45909 Unspecified asthma, uncomplicated: Secondary | ICD-10-CM | POA: Insufficient documentation

## 2015-04-18 DIAGNOSIS — Z9861 Coronary angioplasty status: Secondary | ICD-10-CM | POA: Diagnosis not present

## 2015-04-18 DIAGNOSIS — Z21 Asymptomatic human immunodeficiency virus [HIV] infection status: Secondary | ICD-10-CM | POA: Insufficient documentation

## 2015-04-18 DIAGNOSIS — Z7982 Long term (current) use of aspirin: Secondary | ICD-10-CM | POA: Insufficient documentation

## 2015-04-18 DIAGNOSIS — Z794 Long term (current) use of insulin: Secondary | ICD-10-CM | POA: Insufficient documentation

## 2015-04-18 DIAGNOSIS — E119 Type 2 diabetes mellitus without complications: Secondary | ICD-10-CM | POA: Diagnosis not present

## 2015-04-18 DIAGNOSIS — R079 Chest pain, unspecified: Secondary | ICD-10-CM | POA: Diagnosis present

## 2015-04-18 DIAGNOSIS — Z7902 Long term (current) use of antithrombotics/antiplatelets: Secondary | ICD-10-CM | POA: Diagnosis not present

## 2015-04-18 DIAGNOSIS — R0602 Shortness of breath: Secondary | ICD-10-CM | POA: Diagnosis not present

## 2015-04-18 DIAGNOSIS — F329 Major depressive disorder, single episode, unspecified: Secondary | ICD-10-CM | POA: Diagnosis not present

## 2015-04-18 MED ORDER — ASPIRIN 81 MG PO CHEW
CHEWABLE_TABLET | ORAL | Status: DC
Start: 2015-04-18 — End: 2015-04-19
  Filled 2015-04-18: qty 3

## 2015-04-18 MED ORDER — NITROGLYCERIN 0.4 MG SL SUBL
0.4000 mg | SUBLINGUAL_TABLET | SUBLINGUAL | Status: AC | PRN
  Administered 2015-04-18 – 2015-04-19 (×3): 0.4 mg via SUBLINGUAL

## 2015-04-18 MED ORDER — NITROGLYCERIN 0.4 MG SL SUBL
SUBLINGUAL_TABLET | SUBLINGUAL | Status: AC
  Administered 2015-04-19: 0.4 mg via SUBLINGUAL
  Filled 2015-04-18: qty 3

## 2015-04-18 MED ORDER — ASPIRIN 81 MG PO CHEW
243.0000 mg | CHEWABLE_TABLET | Freq: Once | ORAL | Status: AC
  Administered 2015-04-18: 243 mg via ORAL

## 2015-04-18 NOTE — ED Notes (Signed)
MD at bedside. 

## 2015-04-18 NOTE — ED Provider Notes (Addendum)
CSN: [id number]     Arrival date & time 04/18/15  2255 History  This chart was scribed for Shanon Rosser, MD by Chester Holstein, ED Scribe. This patient was seen in room MH07/MH07 and the patient's care was started at 11:23 PM.    Chief Complaint  Patient presents with  . Chest Pain   The history is provided by the patient. No language interpreter was used.    HPI HPI Comments: Michael [name] is a 64 y.o. male with h/o DM, HTN, HLD, asthma, CAD, stent placement and collagen vascular disease who presents to the Emergency Department complaining of 8/10 sharp upper left chest pain with onset 1 hour PTA. He states the pain radiates into left shoulder. He notes deep inspiration and movement of his arm worsens the pain. There is no exertional component. Pain is different than patient's previous cardiac pain. Pt given NTG on arrival without relief. Pt is currently at [facility] rehab for cocaine addiction. Pt is HIV positive and followed by [name], and has an appointment at 9 AM tomorrow. Pt denies nausea and diaphoresis. He has had some shortness of breath.  Past Medical History  Diagnosis Date  . Diabetes mellitus     Diagnosed in 2002, started insulin in 2012  . Hypertension   . Headache(784.0)     CT head 08/2011: Periventricular and subcortical white matter hypodensities are most in keeping with chronic microangiopathic change  . Gout   . Hyperlipidemia   . HIV infection Nov 2012    Followed by [name]  . Crack cocaine use     for 20+ years, has been enrolled in detox programs in the past  . Chondromalacia of medial femoral condyle     Left knee MRI 04/28/12: Chondromalacia of the medial femoral condyle with slight peripheral degeneration of the meniscocapsular junction of the medial meniscus; followed by sports medicine  . Asthma     No PFTs, history of childhood asthma  . Depression     with history of hospitalization for suicidal ideation  . CAD (coronary artery disease)     a.  06/2013 STEMI/PCI ([facility]): LAD w/ thrombus (treated with BMS), mid 75%, D2 75%; LCX OM2 75%; RCA small, PDA 95%, PLV 95%;  b. 10/2013 Cath/PCI: ISR w/in LAD (Promus DES x 2), borderline OM2 lesion;  c. 01/2014 MV: Intermediate risk, medium-sized distal ant wall infarct w/ very small amt of peri-infarct ischemia. EF 60%.  . Gout 04/28/2012  . Collagen vascular disease   . Cellulitis 04/2014    left facial   Past Surgical History  Procedure Laterality Date  . Bowel resection    . Back surgery      1988  . Cardiac surgery    . Cervical spine surgery      " rods in my neck "  . Coronary stent placement    . Nm myocar perf wall motion  12/27/2011    normal  . Spine surgery     Family History  Problem Relation Age of Onset  . Diabetes Mother   . Hypertension Mother   . Hyperlipidemia Mother   . Diabetes Father   . Cancer Father   . Hypertension Father   . Diabetes Brother   . Heart disease Brother   . Colon cancer Neg Hx   . Diabetes Sister    History  Substance Use Topics  . Smoking status: Never Smoker   . Smokeless tobacco: Never Used  . Alcohol Use: No  Review of Systems A complete 10 system review of systems was obtained and all systems are negative except as noted in the HPI and PMH.     Allergies  Review of patient's allergies indicates no known allergies.  Home Medications   Prior to Admission medications   Medication Sig Start Date End Date Taking? Authorizing Provider  metoprolol (LOPRESSOR) 50 MG tablet Take 50 mg by mouth once.   Yes Historical Provider, MD  albuterol (PROVENTIL HFA;VENTOLIN HFA) 108 (90 BASE) MCG/ACT inhaler Inhale 2 puffs into the lungs every 6 (six) hours as needed for wheezing or shortness of breath. 12/20/14   Encarnacion Slates, NP  allopurinol (ZYLOPRIM) 100 MG tablet Take 2 tablets (200 mg total) by mouth daily. For gouty arthritis 12/20/14   Encarnacion Slates, NP  aspirin EC 81 MG tablet Take 1 tablet (81 mg total) by mouth daily. For heart health  03/03/15   Norman Herrlich, MD  clopidogrel (PLAVIX) 75 MG tablet Take 1 tablet (75 mg total) by mouth daily. For prevention of stroke 03/20/15   Milagros Loll, MD  COMPLERA 200-25-300 MG TABS TAKE 1 TABLET BY MOUTH DAILY. 01/18/15   [name], MD  DULoxetine (CYMBALTA) 60 MG capsule TAKE 1 CAPSULE   BY MOUTH   DAILY 12/26/14   Norman Herrlich, MD  famotidine (PEPCID) 20 MG tablet Take 20 mg by mouth daily. 8pm    Historical Provider, MD  ibuprofen (ADVIL,MOTRIN) 200 MG tablet Take 200 mg by mouth every 6 (six) hours as needed for moderate pain.    Historical Provider, MD  Insulin Glargine (LANTUS SOLOSTAR) 100 UNIT/ML Solostar Pen Inject 15 Units into the skin at bedtime. 03/06/15   Langley Gauss Moding, MD  ipratropium-albuterol (DUONEB) 0.5-2.5 (3) MG/3ML SOLN Take 3 mLs by nebulization every 4 (four) hours as needed (shortness of breath). 12/20/14   Encarnacion Slates, NP  isosorbide mononitrate (IMDUR) 30 MG 24 hr tablet Take 1 tablet (30 mg total) by mouth daily. For chest pain 12/20/14   Encarnacion Slates, NP  lisinopril (PRINIVIL,ZESTRIL) 10 MG tablet Take 1 tablet (10 mg total) by mouth daily. For high blood pressure 03/03/15   Norman Herrlich, MD  metFORMIN (GLUCOPHAGE) 1000 MG tablet Take 1 tablet (1,000 mg total) by mouth 2 (two) times daily with a meal. For diabetes management 12/20/14   Encarnacion Slates, NP  pravastatin (PRAVACHOL) 40 MG tablet Take 1 tablet (40 mg total) by mouth daily. For high cholesterol 12/20/14   Encarnacion Slates, NP   BP 111/66 mmHg  Pulse 85  Resp 16  SpO2 99%   Physical Exam General: Well-developed, well-nourished male in no acute distress; appearance consistent with age of record HENT: normocephalic; atraumatic Eyes: extraocular muscles intact; left pupil 4 mm and sluggish; right pupil 3 mm and reactive; arcus senilis bilaterally Neck: supple Heart: regular rate and rhythm with frequent ectopy; no murmur heard Lungs: clear to auscultation bilaterally Chest: left upper  chest wall tenderness and pain on ROM of left shoulder Abdomen: soft; nondistended; nontender; no masses or hepatosplenomegaly; bowel sounds present Extremities: No deformity; full range of motion; pulses normal Neurologic: Awake, alert and oriented; motor function intact in all extremities and symmetric; no facial droop Skin: Warm and dry Psychiatric: Normal mood and affect Nursing note and vitals reviewed.   ED Course  Procedures (including critical care time)   COORDINATION OF CARE: 11:29 PM Discussed treatment plan with patient at beside, the patient agrees  with the plan and has no further questions at this time.    EKG Interpretation   Date/Time:  Wednesday April 19 2015 00:21:27 EDT Ventricular Rate:  84 PR Interval:  148 QRS Duration: 86 QT Interval:  348 QTC Calculation: 411 R Axis:   17 Text Interpretation:  Sinus rhythm with Premature atrial complexes in a  pattern of bigeminy Minimal voltage criteria for LVH, may be normal  variant Nonspecific T wave abnormality Abnormal ECG No significant change  was found Confirmed by Florina Ou  MD, Jenny Reichmann ([id number]) on 04/19/2015 12:23:26 AM      EKG Repeated:  EKG Interpretation  Date/Time:  Wednesday April 19 2015 00:21:27 EDT Ventricular Rate:  84 PR Interval:  148 QRS Duration: 86 QT Interval:  348 QTC Calculation: 411 R Axis:   17 Text Interpretation:  Sinus rhythm with Premature atrial complexes in a pattern of bigeminy Minimal voltage criteria for LVH, may be normal variant Nonspecific T wave abnormality Abnormal ECG No significant change was found Confirmed by Florina Ou  MD, Jenny Reichmann ([id number]) on 04/19/2015 12:23:26 AM        MDM   Nursing notes and vitals signs, including pulse oximetry, reviewed.  Summary of this visit's results, reviewed by myself:  Labs:  Results for orders placed or performed during the hospital encounter of 04/18/15 (from the past 24 hour(s))  CBC     Status: Abnormal   Collection Time: 04/18/15 11:19 PM   Result Value Ref Range   WBC 3.6 (L) 4.0 - 10.5 K/uL   RBC 3.97 (L) 4.22 - 5.81 MIL/uL   Hemoglobin 11.3 (L) 13.0 - 17.0 g/dL   HCT 34.2 (L) 39.0 - 52.0 %   MCV 86.1 78.0 - 100.0 fL   MCH 28.5 26.0 - 34.0 pg   MCHC 33.0 30.0 - 36.0 g/dL   RDW 13.7 11.5 - 15.5 %   Platelets 167 150 - 400 K/uL  Comprehensive metabolic panel     Status: Abnormal   Collection Time: 04/18/15 11:19 PM  Result Value Ref Range   Sodium 140 135 - 145 mmol/L   Potassium 4.1 3.5 - 5.1 mmol/L   Chloride 105 101 - 111 mmol/L   CO2 27 22 - 32 mmol/L   Glucose, Bld 153 (H) 65 - 99 mg/dL   BUN 18 6 - 20 mg/dL   Creatinine, Ser 1.35 (H) 0.61 - 1.24 mg/dL   Calcium 9.3 8.9 - 10.3 mg/dL   Total Protein 7.6 6.5 - 8.1 g/dL   Albumin 4.3 3.5 - 5.0 g/dL   AST 26 15 - 41 U/L   ALT 18 17 - 63 U/L   Alkaline Phosphatase 60 38 - 126 U/L   Total Bilirubin 0.4 0.3 - 1.2 mg/dL   GFR calc non Af Amer 54 (L) >60 mL/min   GFR calc Af Amer >60 >60 mL/min   Anion gap 8 5 - 15  Troponin I     Status: None   Collection Time: 04/18/15 11:19 PM  Result Value Ref Range   Troponin I <0.03 <0.031 ng/mL  Differential     Status: None   Collection Time: 04/18/15 11:19 PM  Result Value Ref Range   Neutrophils Relative % 46 43 - 77 %   Neutro Abs 1.7 1.7 - 7.7 K/uL   Lymphocytes Relative 40 12 - 46 %   Lymphs Abs 1.5 0.7 - 4.0 K/uL   Monocytes Relative 11 3 - 12 %   Monocytes Absolute 0.4 0.1 - 1.0  K/uL   Eosinophils Relative 3 0 - 5 %   Eosinophils Absolute 0.1 0.0 - 0.7 K/uL   Basophils Relative 1 0 - 1 %   Basophils Absolute 0.0 0.0 - 0.1 K/uL  Troponin I     Status: None   Collection Time: 04/19/15  1:22 AM  Result Value Ref Range   Troponin I <0.03 <0.031 ng/mL  Troponin I     Status: None   Collection Time: 04/19/15  4:08 AM  Result Value Ref Range   Troponin I <0.03 <0.031 ng/mL    Imaging Studies: Dg Chest Port 1 View  04/19/2015   CLINICAL DATA:  Chest pain and shortness of breath with lightheadedness for 2  days. Nonsmoker.  EXAM: PORTABLE CHEST - 1 VIEW  COMPARISON:  03/06/2015  FINDINGS: Shallow inspiration with elevation of the left hemidiaphragm. Normal heart size and pulmonary vascularity. No focal airspace disease or consolidation in the lungs. No blunting of costophrenic angles. No pneumothorax. Mediastinal contours appear intact.  IMPRESSION: No active disease.   Electronically Signed   By: Lucienne Capers M.D.   On: 04/19/2015 01:34   4:47 AM The patient has had 3 negative troponins the latest at 6 hours after onset of pain. There is no evidence of cardiac damage. His history is atypical for coronary artery pain, and more consistent with chest wall pain. His pain did not improve with sublingual nitroglycerin but did improve with IV Toradol and is significantly less severe now. It continues to be reproducible with palpation and movement of the left arm. He has an appointment at 9 PM today with [name] with whom they will discuss his ED visit. He was advised to return should symptoms worsen or change.   Shanon Rosser, MD 04/19/15 [place], MD 04/19/15 [phone]

## 2015-04-18 NOTE — Telephone Encounter (Signed)
Michael [name] should continue to take metformin despite what his CBG is as it does not cause hypoglycemia. His metformin should also be spaced out 12 hours apart from each other. As for lantus,hold it if CBG is less than 90 as that is when he feels hypoglycemic and call clinic for an appoinment. This note will be faxed to [facility].   [name], MD Internal Medicine Resident, PGY 2 [facility] Internal Medicine Program Pager: [phone]

## 2015-04-18 NOTE — ED Notes (Signed)
Pt is at [facility] rehab c/o left sided chest pain which radaites to left shoulder with nausea and SOB x 1 hr , Mylanta given PTA no relief

## 2015-04-19 ENCOUNTER — Encounter: Payer: Self-pay | Admitting: *Deleted

## 2015-04-19 ENCOUNTER — Emergency Department (HOSPITAL_BASED_OUTPATIENT_CLINIC_OR_DEPARTMENT_OTHER): Payer: Medicare Other

## 2015-04-19 ENCOUNTER — Encounter (HOSPITAL_BASED_OUTPATIENT_CLINIC_OR_DEPARTMENT_OTHER): Payer: Self-pay

## 2015-04-19 ENCOUNTER — Ambulatory Visit (INDEPENDENT_AMBULATORY_CARE_PROVIDER_SITE_OTHER): Payer: Medicare Other | Admitting: *Deleted

## 2015-04-19 ENCOUNTER — Emergency Department (HOSPITAL_BASED_OUTPATIENT_CLINIC_OR_DEPARTMENT_OTHER)
Admission: EM | Admit: 2015-04-19 | Discharge: 2015-04-19 | Disposition: A | Discharge status: Home / Self Care | Payer: Medicare Other | Attending: Emergency Medicine | Admitting: Emergency Medicine

## 2015-04-19 DIAGNOSIS — Z7902 Long term (current) use of antithrombotics/antiplatelets: Secondary | ICD-10-CM | POA: Insufficient documentation

## 2015-04-19 DIAGNOSIS — Z872 Personal history of diseases of the skin and subcutaneous tissue: Secondary | ICD-10-CM | POA: Diagnosis not present

## 2015-04-19 DIAGNOSIS — Z23 Encounter for immunization: Secondary | ICD-10-CM | POA: Diagnosis not present

## 2015-04-19 DIAGNOSIS — J45909 Unspecified asthma, uncomplicated: Secondary | ICD-10-CM | POA: Insufficient documentation

## 2015-04-19 DIAGNOSIS — E785 Hyperlipidemia, unspecified: Secondary | ICD-10-CM | POA: Diagnosis not present

## 2015-04-19 DIAGNOSIS — I10 Essential (primary) hypertension: Secondary | ICD-10-CM | POA: Insufficient documentation

## 2015-04-19 DIAGNOSIS — R0789 Other chest pain: Secondary | ICD-10-CM | POA: Insufficient documentation

## 2015-04-19 DIAGNOSIS — M109 Gout, unspecified: Secondary | ICD-10-CM | POA: Diagnosis not present

## 2015-04-19 DIAGNOSIS — Z21 Asymptomatic human immunodeficiency virus [HIV] infection status: Secondary | ICD-10-CM | POA: Insufficient documentation

## 2015-04-19 DIAGNOSIS — Z9861 Coronary angioplasty status: Secondary | ICD-10-CM | POA: Diagnosis not present

## 2015-04-19 DIAGNOSIS — I2510 Atherosclerotic heart disease of native coronary artery without angina pectoris: Secondary | ICD-10-CM | POA: Diagnosis not present

## 2015-04-19 DIAGNOSIS — F329 Major depressive disorder, single episode, unspecified: Secondary | ICD-10-CM | POA: Insufficient documentation

## 2015-04-19 DIAGNOSIS — Z79899 Other long term (current) drug therapy: Secondary | ICD-10-CM | POA: Insufficient documentation

## 2015-04-19 DIAGNOSIS — Z7982 Long term (current) use of aspirin: Secondary | ICD-10-CM | POA: Insufficient documentation

## 2015-04-19 DIAGNOSIS — E119 Type 2 diabetes mellitus without complications: Secondary | ICD-10-CM | POA: Insufficient documentation

## 2015-04-19 DIAGNOSIS — R0602 Shortness of breath: Secondary | ICD-10-CM | POA: Diagnosis not present

## 2015-04-19 DIAGNOSIS — R079 Chest pain, unspecified: Secondary | ICD-10-CM | POA: Diagnosis not present

## 2015-04-19 LAB — CBC WITH DIFFERENTIAL/PLATELET
BASOS ABS: 0 10*3/uL (ref 0.0–0.1)
BASOS PCT: 1 % (ref 0–1)
EOS PCT: 3 % (ref 0–5)
Eosinophils Absolute: 0.1 10*3/uL (ref 0.0–0.7)
HEMATOCRIT: 33.3 % — AB (ref 39.0–52.0)
HEMOGLOBIN: 10.9 g/dL — AB (ref 13.0–17.0)
Lymphocytes Relative: 40 % (ref 12–46)
Lymphs Abs: 1.4 10*3/uL (ref 0.7–4.0)
MCH: 27.9 pg (ref 26.0–34.0)
MCHC: 32.7 g/dL (ref 30.0–36.0)
MCV: 85.2 fL (ref 78.0–100.0)
MONO ABS: 0.4 10*3/uL (ref 0.1–1.0)
MONOS PCT: 12 % (ref 3–12)
Neutro Abs: 1.5 10*3/uL — ABNORMAL LOW (ref 1.7–7.7)
Neutrophils Relative %: 44 % (ref 43–77)
Platelets: 166 10*3/uL (ref 150–400)
RBC: 3.91 MIL/uL — ABNORMAL LOW (ref 4.22–5.81)
RDW: 13.5 % (ref 11.5–15.5)
WBC: 3.5 10*3/uL — ABNORMAL LOW (ref 4.0–10.5)

## 2015-04-19 LAB — COMPREHENSIVE METABOLIC PANEL
ALBUMIN: 4.3 g/dL (ref 3.5–5.0)
ALK PHOS: 60 U/L (ref 38–126)
ALT: 18 U/L (ref 17–63)
AST: 26 U/L (ref 15–41)
Anion gap: 8 (ref 5–15)
BUN: 18 mg/dL (ref 6–20)
CO2: 27 mmol/L (ref 22–32)
Calcium: 9.3 mg/dL (ref 8.9–10.3)
Chloride: 105 mmol/L (ref 101–111)
Creatinine, Ser: 1.35 mg/dL — ABNORMAL HIGH (ref 0.61–1.24)
GFR calc non Af Amer: 54 mL/min — ABNORMAL LOW (ref >60)
Glucose, Bld: 153 mg/dL — ABNORMAL HIGH (ref 65–99)
Potassium: 4.1 mmol/L (ref 3.5–5.1)
Sodium: 140 mmol/L (ref 135–145)
Total Bilirubin: 0.4 mg/dL (ref 0.3–1.2)
Total Protein: 7.6 g/dL (ref 6.5–8.1)

## 2015-04-19 LAB — CBC
HCT: 34.2 % — ABNORMAL LOW (ref 39.0–52.0)
Hemoglobin: 11.3 g/dL — ABNORMAL LOW (ref 13.0–17.0)
MCH: 28.5 pg (ref 26.0–34.0)
MCHC: 33 g/dL (ref 30.0–36.0)
MCV: 86.1 fL (ref 78.0–100.0)
PLATELETS: 167 10*3/uL (ref 150–400)
RBC: 3.97 MIL/uL — ABNORMAL LOW (ref 4.22–5.81)
RDW: 13.7 % (ref 11.5–15.5)
WBC: 3.6 10*3/uL — ABNORMAL LOW (ref 4.0–10.5)

## 2015-04-19 LAB — BASIC METABOLIC PANEL
Anion gap: 7 (ref 5–15)
BUN: 19 mg/dL (ref 6–20)
CALCIUM: 8.9 mg/dL (ref 8.9–10.3)
CO2: 25 mmol/L (ref 22–32)
Chloride: 106 mmol/L (ref 101–111)
Creatinine, Ser: 1.4 mg/dL — ABNORMAL HIGH (ref 0.61–1.24)
GFR calc Af Amer: 60 mL/min — ABNORMAL LOW (ref >60)
GFR calc non Af Amer: 52 mL/min — ABNORMAL LOW (ref >60)
Glucose, Bld: 186 mg/dL — ABNORMAL HIGH (ref 65–99)
POTASSIUM: 4.1 mmol/L (ref 3.5–5.1)
SODIUM: 138 mmol/L (ref 135–145)

## 2015-04-19 LAB — TROPONIN I
Troponin I: <0.03 ng/mL (ref <0.031)
Troponin I: <0.03 ng/mL (ref <0.031)
Troponin I: <0.03 ng/mL (ref <0.031)
Troponin I: <0.03 ng/mL (ref <0.031)

## 2015-04-19 LAB — DIFFERENTIAL
Basophils Absolute: 0 10*3/uL (ref 0.0–0.1)
Basophils Relative: 1 % (ref 0–1)
EOS PCT: 3 % (ref 0–5)
Eosinophils Absolute: 0.1 10*3/uL (ref 0.0–0.7)
Lymphocytes Relative: 40 % (ref 12–46)
Lymphs Abs: 1.5 10*3/uL (ref 0.7–4.0)
Monocytes Absolute: 0.4 10*3/uL (ref 0.1–1.0)
Monocytes Relative: 11 % (ref 3–12)
NEUTROS ABS: 1.7 10*3/uL (ref 1.7–7.7)
Neutrophils Relative %: 46 % (ref 43–77)

## 2015-04-19 MED ORDER — DIAZEPAM 5 MG/ML IJ SOLN: 5.0000 mg | Freq: Once | INTRAMUSCULAR | Status: DC

## 2015-04-19 MED ORDER — KETOROLAC TROMETHAMINE 15 MG/ML IJ SOLN
15.0000 mg | Freq: Once | INTRAMUSCULAR | Status: AC
  Administered 2015-04-19: 15 mg via INTRAVENOUS
  Filled 2015-04-19: qty 1

## 2015-04-19 MED ORDER — KETOROLAC TROMETHAMINE 30 MG/ML IJ SOLN
15.0000 mg | Freq: Once | INTRAMUSCULAR | Status: AC
  Administered 2015-04-19: 15 mg via INTRAVENOUS
  Filled 2015-04-19: qty 1

## 2015-04-19 MED ORDER — MORPHINE SULFATE 4 MG/ML IJ SOLN: 4.0000 mg | Freq: Once | INTRAMUSCULAR | Status: DC

## 2015-04-19 MED ORDER — METHOCARBAMOL 500 MG PO TABS: 500.0000 mg | ORAL_TABLET | Freq: Three times a day (TID) | ORAL | Status: DC | PRN

## 2015-04-19 MED ORDER — METHOCARBAMOL 1000 MG/10ML IJ SOLN
1000.0000 mg | Freq: Once | INTRAMUSCULAR | Status: AC
  Administered 2015-04-19: 1000 mg via INTRAMUSCULAR
  Filled 2015-04-19: qty 10

## 2015-04-19 NOTE — ED Notes (Signed)
CP since yesterday-seen here for same yesterday-[facility] x 2 months for cocaine abuse

## 2015-04-19 NOTE — Discharge Instructions (Signed)

## 2015-04-19 NOTE — Discharge Instructions (Signed)
Take motrin 800 mg every 6 hrs for pain.   Take robaxin as prescribed for muscle spasms.   Follow up with your doctor.   Return to ER if you have severe chest pain, shortness of breath.

## 2015-04-19 NOTE — ED Provider Notes (Signed)
CSN: [id number]     Arrival date & time 04/19/15  2151 History  This chart was scribed for Wandra Arthurs, MD by Steva Colder, ED Scribe. The patient was seen in room MH11/MH11 at 10:24 PM.      Chief Complaint  Patient presents with  . Chest Pain      The history is provided by the patient. No language interpreter was used.    HPI Comments: Michael [name] is a 64 y.o. male with a medical hx of DM, HTN, HIV+, CAD, who presents to the Emergency Department complaining of CP onset 2 days. Pt was seen in the ED yesterday for similar symptoms. Pt went back to [facility] following being discharged from the ED yesterday. Pt has not used cocaine for over 60 days. Pt reports that his CP never went away from yesterday. Pt didn't see his infectious disease doctor but was given a shot of something that he didn't remember. Pt states that his pain is worsened with movement of his arm. He states that he is having associated symptoms of left shoulder pain. He denies any other symptoms.     Past Medical History  Diagnosis Date  . Diabetes mellitus     Diagnosed in 2002, started insulin in 2012  . Hypertension   . Headache(784.0)     CT head 08/2011: Periventricular and subcortical white matter hypodensities are most in keeping with chronic microangiopathic change  . Gout   . Hyperlipidemia   . HIV infection Nov 2012    Followed by [name]  . Crack cocaine use     for 20+ years, has been enrolled in detox programs in the past  . Chondromalacia of medial femoral condyle     Left knee MRI 04/28/12: Chondromalacia of the medial femoral condyle with slight peripheral degeneration of the meniscocapsular junction of the medial meniscus; followed by sports medicine  . Asthma     No PFTs, history of childhood asthma  . Depression     with history of hospitalization for suicidal ideation  . CAD (coronary artery disease)     a. 06/2013 STEMI/PCI ([facility]): LAD w/ thrombus (treated with BMS), mid 75%, D2 75%;  LCX OM2 75%; RCA small, PDA 95%, PLV 95%;  b. 10/2013 Cath/PCI: ISR w/in LAD (Promus DES x 2), borderline OM2 lesion;  c. 01/2014 MV: Intermediate risk, medium-sized distal ant wall infarct w/ very small amt of peri-infarct ischemia. EF 60%.  . Gout 04/28/2012  . Collagen vascular disease   . Cellulitis 04/2014    left facial   Past Surgical History  Procedure Laterality Date  . Bowel resection    . Back surgery      1988  . Cardiac surgery    . Cervical spine surgery      " rods in my neck "  . Coronary stent placement    . Nm myocar perf wall motion  12/27/2011    normal  . Spine surgery     Family History  Problem Relation Age of Onset  . Diabetes Mother   . Hypertension Mother   . Hyperlipidemia Mother   . Diabetes Father   . Cancer Father   . Hypertension Father   . Diabetes Brother   . Heart disease Brother   . Colon cancer Neg Hx   . Diabetes Sister    History  Substance Use Topics  . Smoking status: Never Smoker   . Smokeless tobacco: Never Used  . Alcohol Use: No  Review of Systems  Cardiovascular: Positive for chest pain.  Musculoskeletal: Positive for arthralgias.  All other systems reviewed and are negative.     Allergies  Review of patient's allergies indicates no known allergies.  Home Medications   Prior to Admission medications   Medication Sig Start Date End Date Taking? Authorizing Provider  albuterol (PROVENTIL HFA;VENTOLIN HFA) 108 (90 BASE) MCG/ACT inhaler Inhale 2 puffs into the lungs every 6 (six) hours as needed for wheezing or shortness of breath. 12/20/14   Encarnacion Slates, NP  allopurinol (ZYLOPRIM) 100 MG tablet Take 2 tablets (200 mg total) by mouth daily. For gouty arthritis 12/20/14   Encarnacion Slates, NP  aspirin EC 81 MG tablet Take 1 tablet (81 mg total) by mouth daily. For heart health 03/03/15   Norman Herrlich, MD  clopidogrel (PLAVIX) 75 MG tablet Take 1 tablet (75 mg total) by mouth daily. For prevention of stroke 03/20/15   Milagros Loll, MD  COMPLERA 200-25-300 MG TABS TAKE 1 TABLET BY MOUTH DAILY. 01/18/15   [name], MD  DULoxetine (CYMBALTA) 60 MG capsule TAKE 1 CAPSULE   BY MOUTH   DAILY 12/26/14   Norman Herrlich, MD  famotidine (PEPCID) 20 MG tablet Take 20 mg by mouth daily. 8pm    Historical Provider, MD  ibuprofen (ADVIL,MOTRIN) 200 MG tablet Take 200 mg by mouth every 6 (six) hours as needed for moderate pain.    Historical Provider, MD  Insulin Glargine (LANTUS SOLOSTAR) 100 UNIT/ML Solostar Pen Inject 15 Units into the skin at bedtime. 03/06/15   Langley Gauss Moding, MD  ipratropium-albuterol (DUONEB) 0.5-2.5 (3) MG/3ML SOLN Take 3 mLs by nebulization every 4 (four) hours as needed (shortness of breath). 12/20/14   Encarnacion Slates, NP  isosorbide mononitrate (IMDUR) 30 MG 24 hr tablet Take 1 tablet (30 mg total) by mouth daily. For chest pain 12/20/14   Encarnacion Slates, NP  lisinopril (PRINIVIL,ZESTRIL) 10 MG tablet Take 1 tablet (10 mg total) by mouth daily. For high blood pressure 03/03/15   Norman Herrlich, MD  metFORMIN (GLUCOPHAGE) 1000 MG tablet Take 1 tablet (1,000 mg total) by mouth 2 (two) times daily with a meal. For diabetes management 12/20/14   Encarnacion Slates, NP  metoprolol (LOPRESSOR) 50 MG tablet Take 50 mg by mouth once.    Historical Provider, MD  pravastatin (PRAVACHOL) 40 MG tablet Take 1 tablet (40 mg total) by mouth daily. For high cholesterol 12/20/14   Encarnacion Slates, NP   BP 152/87 mmHg  Pulse 62  Temp(Src) 98.2 F (36.8 C) (Oral)  Resp 20  Ht 5\' 7"  (1.702 m)  Wt 173 lb (78.472 kg)  BMI 27.09 kg/m2  SpO2 100% Physical Exam  Constitutional: He is oriented to person, place, and time. He appears well-developed and well-nourished.  HENT:  Head: Normocephalic.  Mouth/Throat: Oropharynx is clear and moist.  Eyes: Conjunctivae are normal. Pupils are equal, round, and reactive to light.  Neck: Normal range of motion.  Cardiovascular: Normal rate, regular rhythm and normal heart sounds.  Exam  reveals no gallop and no friction rub.   No murmur heard. Pulmonary/Chest: Effort normal and breath sounds normal. No respiratory distress. He has no wheezes. He has no rales. He exhibits tenderness.  Reproducible tenderness on the left side of chest  Abdominal: Soft. Bowel sounds are normal. He exhibits no distension. There is no tenderness. There is no rebound.  Musculoskeletal: Normal range of motion. He  exhibits no edema or tenderness.  Neurological: He is alert and oriented to person, place, and time.  Skin: Skin is warm and dry.  Psychiatric: He has a normal mood and affect. His behavior is normal. Judgment and thought content normal.  Nursing note and vitals reviewed.   ED Course  Procedures (including critical care time) DIAGNOSTIC STUDIES: Oxygen Saturation is 100% on RA, nl by my interpretation.    COORDINATION OF CARE: 10:28 PM-Discussed treatment plan with pt at bedside and pt agreed to plan.   Labs Review Labs Reviewed  CBC WITH DIFFERENTIAL/PLATELET - Abnormal; Notable for the following:    WBC 3.5 (*)    RBC 3.91 (*)    Hemoglobin 10.9 (*)    HCT 33.3 (*)    Neutro Abs 1.5 (*)    All other components within normal limits  BASIC METABOLIC PANEL - Abnormal; Notable for the following:    Glucose, Bld 186 (*)    Creatinine, Ser 1.40 (*)    GFR calc non Af Amer 52 (*)    GFR calc Af Amer 60 (*)    All other components within normal limits  TROPONIN I    Imaging Review Dg Chest Port 1 View  04/19/2015   CLINICAL DATA:  Chest pain and shortness of breath with lightheadedness for 2 days. Nonsmoker.  EXAM: PORTABLE CHEST - 1 VIEW  COMPARISON:  03/06/2015  FINDINGS: Shallow inspiration with elevation of the left hemidiaphragm. Normal heart size and pulmonary vascularity. No focal airspace disease or consolidation in the lungs. No blunting of costophrenic angles. No pneumothorax. Mediastinal contours appear intact.  IMPRESSION: No active disease.   Electronically Signed    By: Lucienne Capers M.D.   On: 04/19/2015 01:34     EKG Interpretation None      MDM   Final diagnoses:  None    MONTELL WINGET is a 64 y.o. male here with chest pain. Pain is reproducible and had neg delta trop yesterday. I doubt ACS. Likely MSK. Will repeat labs and trop. Will give toradol, robaxin.   11:34 PM Trop neg x 1 and symptoms for more than 24 hrs. Pain improved. Will dc back to [facility] with robaxin.    I personally performed the services described in this documentation, which was scribed in my presence. The recorded information has been reviewed and is accurate.   Wandra Arthurs, MD 04/19/15 [phone]

## 2015-04-20 ENCOUNTER — Telehealth: Payer: Self-pay | Admitting: Cardiovascular Disease

## 2015-04-20 NOTE — Telephone Encounter (Signed)
Pt called in stating that he has been to the ER 2 times this week for severe chest pain and he would like to be seen as soon as possible between today and tomorrow. He says that it is ok for the nurse to speak with his social worker Legrand Como when calling back . He can be reached at either the number listed in contact info or he can be reached at [phone]. Please call  Thanks

## 2015-04-20 NOTE — Telephone Encounter (Signed)
Called work - person answering is unable to confirm/deny patient resides there ([facility]) Called mobile - woman answered said patient does not live there, lives at [facility] [phone] ([place])  Called work # again and left message with Mining engineer for patient to call our office

## 2015-04-20 NOTE — Telephone Encounter (Signed)
Patient and his case worker at [facility] calling to get a FLEX appt today or tomorrow. Patient has been having ongoing CP (sharp, upper left chest with shoulder radiating soreness) for a few days now. He is also having anxiety so case worker states it is difficult to know what is SOB vs anxiety. Patient very anxious of CP and history of cardiac issues. No other notable sx voiced. Patient requesting to be seen as CP continues today and he is unable to stay at [facility] with continued CP. He also can not take pain medications while he is a resident there.   Patient has been to ED x2 days with CP. On 7/12, ED dx was CWP with notation of: "The patient has had 3 negative troponins the latest at 6 hours after onset of pain. There is no evidence of cardiac damage. His history is atypical for coronary artery pain, and more consistent with chest wall pain. His pain did not improve with sublingual nitroglycerin but did improve with IV Toradol and is significantly less severe now. It continues to be reproducible with palpation and movement of the left arm."  He was seen in ED again on 7/13, ED notation: "Michael [name] is a 64 y.o. male here with chest pain. Pain is reproducible and had neg delta trop yesterday. I doubt ACS. Likely MSK. Will repeat labs and trop. Will give toradol, robaxin. Trop neg x 1 and symptoms for more than 24 hrs. Pain improved. Will dc back to [facility] with robaxin."  Scheduled to see Truitt Merle, NP, on Friday, July 15th at 9:00 am. He was instructed to arrive at the [place] office at [employer] am to register. Case worker, Michael [name] (consent was provided to speak with case worker) verified they can transport pt to office tomorrow.

## 2015-04-21 ENCOUNTER — Ambulatory Visit (INDEPENDENT_AMBULATORY_CARE_PROVIDER_SITE_OTHER): Payer: Medicare Other | Admitting: Nurse Practitioner

## 2015-04-21 ENCOUNTER — Encounter: Payer: Self-pay | Admitting: Nurse Practitioner

## 2015-04-21 VITALS — BP 122/86 | HR 76 | Ht 67.5 in | Wt 177.8 lb

## 2015-04-21 DIAGNOSIS — R079 Chest pain, unspecified: Secondary | ICD-10-CM

## 2015-04-21 DIAGNOSIS — I259 Chronic ischemic heart disease, unspecified: Secondary | ICD-10-CM | POA: Diagnosis not present

## 2015-04-21 MED ORDER — IBUPROFEN 200 MG PO TABS: 200.0000 mg | ORAL_TABLET | Freq: Three times a day (TID) | ORAL | Status: DC

## 2015-04-21 NOTE — Patient Instructions (Addendum)
We will be checking the following labs today - NONE   Medication Instructions:    Continue with your current medicines.   I would recommend taking the Ibuprofen every 8 hours around the clock for the next 3 days and then back to Q6h prn.    Testing/Procedures To Be Arranged:  N/A  Follow-Up:   See Dr. Sallyanne Kuster as planned next August    Other Special Instructions:   N/A  Call the [place] office at [phone] if you have any questions, problems or concerns.
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
## 2015-04-26 ENCOUNTER — Ambulatory Visit (INDEPENDENT_AMBULATORY_CARE_PROVIDER_SITE_OTHER): Payer: Medicare Other | Admitting: Internal Medicine

## 2015-04-26 ENCOUNTER — Encounter: Payer: Self-pay | Admitting: Internal Medicine

## 2015-04-26 VITALS — BP 136/70 | HR 85 | Temp 98.0°F | Ht 67.5 in | Wt 178.2 lb

## 2015-04-26 DIAGNOSIS — Z981 Arthrodesis status: Secondary | ICD-10-CM

## 2015-04-26 DIAGNOSIS — IMO0002 Reserved for concepts with insufficient information to code with codable children: Secondary | ICD-10-CM

## 2015-04-26 DIAGNOSIS — R05 Cough: Secondary | ICD-10-CM | POA: Diagnosis not present

## 2015-04-26 DIAGNOSIS — R059 Cough, unspecified: Secondary | ICD-10-CM | POA: Insufficient documentation

## 2015-04-26 DIAGNOSIS — Z21 Asymptomatic human immunodeficiency virus [HIV] infection status: Secondary | ICD-10-CM | POA: Diagnosis not present

## 2015-04-26 DIAGNOSIS — E1165 Type 2 diabetes mellitus with hyperglycemia: Secondary | ICD-10-CM

## 2015-04-26 DIAGNOSIS — M542 Cervicalgia: Secondary | ICD-10-CM

## 2015-04-26 DIAGNOSIS — M5416 Radiculopathy, lumbar region: Secondary | ICD-10-CM

## 2015-04-26 DIAGNOSIS — E1140 Type 2 diabetes mellitus with diabetic neuropathy, unspecified: Secondary | ICD-10-CM

## 2015-04-26 LAB — GLUCOSE, CAPILLARY: Glucose-Capillary: 92 mg/dL (ref 65–99)

## 2015-04-26 MED ORDER — AZITHROMYCIN 250 MG PO TABS: ORAL_TABLET | ORAL | Status: AC

## 2015-04-26 MED ORDER — LORATADINE 10 MG PO TABS: 10.0000 mg | ORAL_TABLET | Freq: Every day | ORAL | Status: DC | PRN
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
## 2015-04-26 NOTE — Assessment & Plan Note (Signed)
Patient states that he has a cough for the last month that occurs more often in the morning with sensations of a tickle in the throat. He states that he has rhinorrhea with post-nasal drainage. His cough is productive of thick sputum that is clear and sometimes black. He has occasional SOB and wheezing. He takes Lisinopril 10 mg for blood pressure. He states that he has not had any previous episodes like this that he recalls. Patient denies any sick contacts, no fever, N/V/D. He has history of asthma and uses Albuterol PRN. Also has history of HIV with last RNA quantitative <20.  Patient has end expiratory wheeze on exam with cough elicited on deep respirations.  This seems likely to be a upper airway or post nasal drip process. -Course of Zithromax Z-Pak 250 mg. Two tablets Day 1, followed by 1 tablet days 2-5. -Start Loratadine 10 mg once daily PRN -If no improvement, consider Lisinopril as cause of cough and switch to ARB.

## 2015-04-26 NOTE — Progress Notes (Signed)
Patient ID: Michael [name], male   DOB: 08-20-1951, 64 y.o.   MRN: [id number]   Subjective:   Patient ID: Michael [name] male   DOB: 01/14/1951 64 y.o.   MRN: [id number]  HPI: [name] is a 63 y.o. w/ PMH as listed below who is visiting [place] with complaints of cough and request for referral to Neurosurgery.  Please see problem list for details.  Past Medical History  Diagnosis Date  . Diabetes mellitus     Diagnosed in 2002, started insulin in 2012  . Hypertension   . Headache(784.0)     CT head 08/2011: Periventricular and subcortical white matter hypodensities are most in keeping with chronic microangiopathic change  . Gout   . Hyperlipidemia   . HIV infection Nov 2012    Followed by [name]  . Crack cocaine use     for 20+ years, has been enrolled in detox programs in the past  . Chondromalacia of medial femoral condyle     Left knee MRI 04/28/12: Chondromalacia of the medial femoral condyle with slight peripheral degeneration of the meniscocapsular junction of the medial meniscus; followed by sports medicine  . Asthma     No PFTs, history of childhood asthma  . Depression     with history of hospitalization for suicidal ideation  . CAD (coronary artery disease)     a. 06/2013 STEMI/PCI ([facility]): LAD w/ thrombus (treated with BMS), mid 75%, D2 75%; LCX OM2 75%; RCA small, PDA 95%, PLV 95%;  b. 10/2013 Cath/PCI: ISR w/in LAD (Promus DES x 2), borderline OM2 lesion;  c. 01/2014 MV: Intermediate risk, medium-sized distal ant wall infarct w/ very small amt of peri-infarct ischemia. EF 60%.  . Gout 04/28/2012  . Collagen vascular disease   . Cellulitis 04/2014    left facial   Current Outpatient Prescriptions  Medication Sig Dispense Refill  . allopurinol (ZYLOPRIM) 100 MG tablet Take 2 tablets (200 mg total) by mouth daily. For gouty arthritis    . aspirin EC 81 MG tablet Take 1 tablet (81 mg total) by mouth daily. For heart health    . azithromycin (ZITHROMAX Z-PAK) 250  MG tablet Take 2 tablets (500 mg) on  Day 1,  followed by 1 tablet (250 mg) once daily on Days 2 through 5. 6 each 0  . clopidogrel (PLAVIX) 75 MG tablet Take 1 tablet (75 mg total) by mouth daily. For prevention of stroke 30 tablet 3  . COMPLERA 200-25-300 MG TABS TAKE 1 TABLET BY MOUTH DAILY. 30 tablet 3  . DULoxetine (CYMBALTA) 60 MG capsule TAKE 1 CAPSULE   BY MOUTH   DAILY 30 capsule 6  . famotidine (PEPCID) 20 MG tablet Take 20 mg by mouth daily. 8pm    . ibuprofen (ADVIL) 200 MG tablet Take 1 tablet (200 mg total) by mouth 3 (three) times daily. Every 8 hours for the next 3 days and then back to Q6 prn pain. 30 tablet 0  . Insulin Glargine (LANTUS SOLOSTAR) 100 UNIT/ML Solostar Pen Inject 15 Units into the skin at bedtime. 15 pen 5  . isosorbide mononitrate (IMDUR) 30 MG 24 hr tablet Take 1 tablet (30 mg total) by mouth daily. For chest pain 30 tablet 6  . lisinopril (PRINIVIL,ZESTRIL) 10 MG tablet Take 1 tablet (10 mg total) by mouth daily. For high blood pressure    . loratadine (CLARITIN) 10 MG tablet Take 1 tablet (10 mg total) by mouth daily as needed for allergies.  30 tablet 2  . metFORMIN (GLUCOPHAGE) 1000 MG tablet Take 1 tablet (1,000 mg total) by mouth 2 (two) times daily with a meal. For diabetes management    . metoprolol succinate (TOPROL-XL) 25 MG 24 hr tablet 50 mg.     . pravastatin (PRAVACHOL) 40 MG tablet Take 1 tablet (40 mg total) by mouth daily. For high cholesterol 30 tablet    No current facility-administered medications for this visit.   Family History  Problem Relation Age of Onset  . Diabetes Mother   . Hypertension Mother   . Hyperlipidemia Mother   . Diabetes Father   . Cancer Father   . Hypertension Father   . Diabetes Brother   . Heart disease Brother   . Colon cancer Neg Hx   . Diabetes Sister    History   Social History  . Marital Status: Divorced    Spouse Name: N/A  . Number of Children: N/A  . Years of Education: N/A   Social History Main  Topics  . Smoking status: Never Smoker   . Smokeless tobacco: Never Used  . Alcohol Use: No  . Drug Use: Yes    Special: "Crack" cocaine, Cocaine     Comment: last used crack 04/2014. In rehab  . Sexual Activity: No     Comment: accepted condoms   Other Topics Concern  . None   Social History Narrative   Currently staying with a friend in [place].  Was staying @ local motel until a few days ago - left b/c of bed bugs.   Review of Systems: Review of Systems  Constitutional: Negative for fever, chills, weight loss and malaise/fatigue.  HENT: Positive for sore throat. Negative for congestion.        Rhinorrhea  Eyes: Positive for blurred vision.  Respiratory: Positive for cough, sputum production, shortness of breath and wheezing. Negative for hemoptysis.   Cardiovascular: Negative for chest pain, palpitations and leg swelling.  Gastrointestinal: Negative for nausea, vomiting, abdominal pain, diarrhea, constipation and blood in stool.  Genitourinary: Negative for dysuria, urgency, frequency and hematuria.  Musculoskeletal: Positive for back pain, joint pain and neck pain. Negative for myalgias and falls.  Skin: Negative for rash.  Neurological: Positive for tingling. Negative for dizziness and headaches.       Left leg weakness.     Objective:  Physical Exam: Filed Vitals:   04/26/15 1422  BP: 136/70  Pulse: 85  Temp: 98 F (36.7 C)  TempSrc: Oral  Height: 5' 7.5" (1.715 m)  Weight: 178 lb 3.2 oz (80.831 kg)  SpO2: 100%   Physical Exam  Constitutional: He is oriented to person, place, and time. He appears well-developed and well-nourished.  HENT:  Head: Normocephalic and atraumatic.  Mouth/Throat: Oropharynx is clear and moist.  Eyes: EOM are normal. Pupils are equal, round, and reactive to light.  Neck: Spinous process tenderness present. Decreased range of motion present.  Cardiovascular: Normal rate and regular rhythm.   No murmur heard. Pulmonary/Chest: He exhibits  tenderness.  Has end expiratory wheezing. Cough elicited on deep inspiration.  Abdominal: Soft. There is no tenderness.  Musculoskeletal:  Spinous process tenderness most pronounced at cervical and lumbar areas. Tenderness at left shoulder and left knee. Patient has decreased strength at LLE compared to right. Upper extremity strength is equal. Patient ambulates slowly with forward flexion of the back.  Neurological: He is alert and oriented to person, place, and time.  Skin: Skin is warm.  Psychiatric: He has a  normal mood and affect.    Assessment & Plan:  Please see problem based charting for current assessment and plan.

## 2015-04-26 NOTE — Patient Instructions (Signed)
Thank you for your visit.  I have sent a referral to Neurosurgery and we will try to have an appointment with Dr. Arnoldo Morale.  For your cough, I am writing a prescription to your pharmacy for an antihistamine and antibiotic. Please take these as instructed.
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
## 2015-04-26 NOTE — Progress Notes (Signed)
Medicine attending: I personally interviewed and briefly examined this patient and reviewed pertinent laboratory data together with resident physician Dr.Vishal Posey Pronto and and we reviewed a management plan.

## 2015-05-03 ENCOUNTER — Emergency Department (HOSPITAL_BASED_OUTPATIENT_CLINIC_OR_DEPARTMENT_OTHER)
Admission: EM | Admit: 2015-05-03 | Discharge: 2015-05-03 | Disposition: A | Discharge status: Home / Self Care | Payer: Medicare Other | Attending: Emergency Medicine | Admitting: Emergency Medicine

## 2015-05-03 ENCOUNTER — Emergency Department (HOSPITAL_BASED_OUTPATIENT_CLINIC_OR_DEPARTMENT_OTHER): Payer: Medicare Other

## 2015-05-03 ENCOUNTER — Encounter (HOSPITAL_BASED_OUTPATIENT_CLINIC_OR_DEPARTMENT_OTHER): Payer: Self-pay

## 2015-05-03 DIAGNOSIS — I2510 Atherosclerotic heart disease of native coronary artery without angina pectoris: Secondary | ICD-10-CM | POA: Diagnosis not present

## 2015-05-03 DIAGNOSIS — Z7982 Long term (current) use of aspirin: Secondary | ICD-10-CM | POA: Diagnosis not present

## 2015-05-03 DIAGNOSIS — Z21 Asymptomatic human immunodeficiency virus [HIV] infection status: Secondary | ICD-10-CM | POA: Diagnosis not present

## 2015-05-03 DIAGNOSIS — F329 Major depressive disorder, single episode, unspecified: Secondary | ICD-10-CM | POA: Diagnosis not present

## 2015-05-03 DIAGNOSIS — I10 Essential (primary) hypertension: Secondary | ICD-10-CM | POA: Insufficient documentation

## 2015-05-03 DIAGNOSIS — M109 Gout, unspecified: Secondary | ICD-10-CM | POA: Diagnosis not present

## 2015-05-03 DIAGNOSIS — Z794 Long term (current) use of insulin: Secondary | ICD-10-CM | POA: Diagnosis not present

## 2015-05-03 DIAGNOSIS — E1165 Type 2 diabetes mellitus with hyperglycemia: Secondary | ICD-10-CM | POA: Diagnosis not present

## 2015-05-03 DIAGNOSIS — J45909 Unspecified asthma, uncomplicated: Secondary | ICD-10-CM | POA: Insufficient documentation

## 2015-05-03 DIAGNOSIS — R0789 Other chest pain: Secondary | ICD-10-CM | POA: Diagnosis not present

## 2015-05-03 DIAGNOSIS — Z79899 Other long term (current) drug therapy: Secondary | ICD-10-CM | POA: Insufficient documentation

## 2015-05-03 DIAGNOSIS — Z872 Personal history of diseases of the skin and subcutaneous tissue: Secondary | ICD-10-CM | POA: Diagnosis not present

## 2015-05-03 DIAGNOSIS — R0602 Shortness of breath: Secondary | ICD-10-CM | POA: Diagnosis not present

## 2015-05-03 DIAGNOSIS — E785 Hyperlipidemia, unspecified: Secondary | ICD-10-CM | POA: Diagnosis not present

## 2015-05-03 LAB — CBC WITH DIFFERENTIAL/PLATELET
BASOS ABS: 0 10*3/uL (ref 0.0–0.1)
Basophils Relative: 1 % (ref 0–1)
EOS PCT: 4 % (ref 0–5)
Eosinophils Absolute: 0.1 10*3/uL (ref 0.0–0.7)
HCT: 33 % — ABNORMAL LOW (ref 39.0–52.0)
Hemoglobin: 10.7 g/dL — ABNORMAL LOW (ref 13.0–17.0)
Lymphocytes Relative: 38 % (ref 12–46)
Lymphs Abs: 1.3 10*3/uL (ref 0.7–4.0)
MCH: 28 pg (ref 26.0–34.0)
MCHC: 32.4 g/dL (ref 30.0–36.0)
MCV: 86.4 fL (ref 78.0–100.0)
Monocytes Absolute: 0.4 10*3/uL (ref 0.1–1.0)
Monocytes Relative: 11 % (ref 3–12)
NEUTROS ABS: 1.6 10*3/uL — AB (ref 1.7–7.7)
Neutrophils Relative %: 46 % (ref 43–77)
Platelets: 171 10*3/uL (ref 150–400)
RBC: 3.82 MIL/uL — ABNORMAL LOW (ref 4.22–5.81)
RDW: 14.2 % (ref 11.5–15.5)
WBC: 3.5 10*3/uL — ABNORMAL LOW (ref 4.0–10.5)

## 2015-05-03 LAB — BASIC METABOLIC PANEL
ANION GAP: 4 — AB (ref 5–15)
BUN: 19 mg/dL (ref 6–20)
CO2: 26 mmol/L (ref 22–32)
Calcium: 9 mg/dL (ref 8.9–10.3)
Chloride: 108 mmol/L (ref 101–111)
Creatinine, Ser: 1.28 mg/dL — ABNORMAL HIGH (ref 0.61–1.24)
GFR calc Af Amer: >60 mL/min (ref >60)
GFR calc non Af Amer: 57 mL/min — ABNORMAL LOW (ref >60)
Glucose, Bld: 155 mg/dL — ABNORMAL HIGH (ref 65–99)
Potassium: 4 mmol/L (ref 3.5–5.1)
Sodium: 138 mmol/L (ref 135–145)

## 2015-05-03 LAB — CBG MONITORING, ED: GLUCOSE-CAPILLARY: 153 mg/dL — AB (ref 65–99)

## 2015-05-03 NOTE — ED Notes (Signed)
Pt attempting to provide urine sample

## 2015-05-03 NOTE — Discharge Instructions (Signed)
Continue your current medications as prescribed.     Diabetes Mellitus and Food It is important for you to manage your blood sugar (glucose) level. Your blood glucose level can be greatly affected by what you eat. Eating healthier foods in the appropriate amounts throughout the day at about the same time each day will help you control your blood glucose level. It can also help slow or prevent worsening of your diabetes mellitus. Healthy eating may even help you improve the level of your blood pressure and reach or maintain a healthy weight.  HOW CAN FOOD AFFECT ME? Carbohydrates Carbohydrates affect your blood glucose level more than any other type of food. Your dietitian will help you determine how many carbohydrates to eat at each meal and teach you how to count carbohydrates. Counting carbohydrates is important to keep your blood glucose at a healthy level, especially if you are using insulin or taking certain medicines for diabetes mellitus. Alcohol Alcohol can cause sudden decreases in blood glucose (hypoglycemia), especially if you use insulin or take certain medicines for diabetes mellitus. Hypoglycemia can be a life-threatening condition. Symptoms of hypoglycemia (sleepiness, dizziness, and disorientation) are similar to symptoms of having too much alcohol.  If your health care provider has given you approval to drink alcohol, do so in moderation and use the following guidelines:  Women should not have more than one drink per day, and men should not have more than two drinks per day. One drink is equal to:  12 oz of beer.  5 oz of wine.  1 oz of hard liquor.  Do not drink on an empty stomach.  Keep yourself hydrated. Have water, diet soda, or unsweetened iced tea.  Regular soda, juice, and other mixers might contain a lot of carbohydrates and should be counted. WHAT FOODS ARE NOT RECOMMENDED? As you make food choices, it is important to remember that all foods are not the same.  Some foods have fewer nutrients per serving than other foods, even though they might have the same number of calories or carbohydrates. It is difficult to get your body what it needs when you eat foods with fewer nutrients. Examples of foods that you should avoid that are high in calories and carbohydrates but low in nutrients include:  Trans fats (most processed foods list trans fats on the Nutrition Facts label).  Regular soda.  Juice.  Candy.  Sweets, such as cake, pie, doughnuts, and cookies.  Fried foods. WHAT FOODS CAN I EAT? Have nutrient-rich foods, which will nourish your body and keep you healthy. The food you should eat also will depend on several factors, including:  The calories you need.  The medicines you take.  Your weight.  Your blood glucose level.  Your blood pressure level.  Your cholesterol level. You also should eat a variety of foods, including:  Protein, such as meat, poultry, fish, tofu, nuts, and seeds (lean animal proteins are best).  Fruits.  Vegetables.  Dairy products, such as milk, cheese, and yogurt (low fat is best).  Breads, grains, pasta, cereal, rice, and beans.  Fats such as olive oil, trans fat-free margarine, canola oil, avocado, and olives. DOES EVERYONE WITH DIABETES MELLITUS HAVE THE SAME MEAL PLAN? Because every person with diabetes mellitus is different, there is not one meal plan that works for everyone. It is very important that you meet with a dietitian who will help you create a meal plan that is just right for you. Document Released: 06/20/2005 Document Revised: 09/28/2013  Document Reviewed: 08/20/2013 [facility] Patient Information 2015 [place], [place]. This information is not intended to replace advice given to you by your health care provider. Make sure you discuss any questions you have with your health care provider.

## 2015-05-03 NOTE — ED Notes (Signed)
Patient transported to X-ray 

## 2015-05-03 NOTE — ED Provider Notes (Signed)
CSN: [id number]     Arrival date & time 05/03/15  1600 History   First MD Initiated Contact with Patient 05/03/15 1625     Chief Complaint  Patient presents with  . Hyperglycemia     The history is provided by the patient. No language interpreter was used.   Michael [name] presents for evaluation of hyperglycemia.  He checked his blood sugar today and it was 403 and he was referred to the ED for further eval.  He received his routine lantus, 15 units, last night.  He also takes metformin BID.  He has experienced intermittent CP/SOB for the last month with recent cardiology work up and he was told it was not his heart but related to his back.  He denies any recent fever, abdominal pain, vomiting, dysuria.  He is currently at [facility] and has a very starchy diet, but his diet is relatively unchanged.   Past Medical History  Diagnosis Date  . Diabetes mellitus     Diagnosed in 2002, started insulin in 2012  . Hypertension   . Headache(784.0)     CT head 08/2011: Periventricular and subcortical white matter hypodensities are most in keeping with chronic microangiopathic change  . Gout   . Hyperlipidemia   . HIV infection Nov 2012    Followed by [name]  . Crack cocaine use     for 20+ years, has been enrolled in detox programs in the past  . Chondromalacia of medial femoral condyle     Left knee MRI 04/28/12: Chondromalacia of the medial femoral condyle with slight peripheral degeneration of the meniscocapsular junction of the medial meniscus; followed by sports medicine  . Asthma     No PFTs, history of childhood asthma  . Depression     with history of hospitalization for suicidal ideation  . CAD (coronary artery disease)     a. 06/2013 STEMI/PCI ([facility]): LAD w/ thrombus (treated with BMS), mid 75%, D2 75%; LCX OM2 75%; RCA small, PDA 95%, PLV 95%;  b. 10/2013 Cath/PCI: ISR w/in LAD (Promus DES x 2), borderline OM2 lesion;  c. 01/2014 MV: Intermediate risk, medium-sized distal ant wall  infarct w/ very small amt of peri-infarct ischemia. EF 60%.  . Gout 04/28/2012  . Collagen vascular disease   . Cellulitis 04/2014    left facial   Past Surgical History  Procedure Laterality Date  . Bowel resection    . Back surgery      1988  . Cardiac surgery    . Cervical spine surgery      " rods in my neck "  . Coronary stent placement    . Nm myocar perf wall motion  12/27/2011    normal  . Spine surgery     Family History  Problem Relation Age of Onset  . Diabetes Mother   . Hypertension Mother   . Hyperlipidemia Mother   . Diabetes Father   . Cancer Father   . Hypertension Father   . Diabetes Brother   . Heart disease Brother   . Colon cancer Neg Hx   . Diabetes Sister    History  Substance Use Topics  . Smoking status: Never Smoker   . Smokeless tobacco: Never Used  . Alcohol Use: No    Review of Systems  All other systems reviewed and are negative.     Allergies  Review of patient's allergies indicates no known allergies.  Home Medications   Prior to Admission medications  Medication Sig Start Date End Date Taking? Authorizing Provider  albuterol (PROVENTIL HFA;VENTOLIN HFA) 108 (90 BASE) MCG/ACT inhaler Inhale into the lungs every 6 (six) hours as needed for wheezing or shortness of breath.   Yes Historical Provider, MD  Azithromycin (ZITHROMAX PO) Take by mouth.   Yes Historical Provider, MD  METOPROLOL TARTRATE PO Take by mouth.   Yes Historical Provider, MD  allopurinol (ZYLOPRIM) 100 MG tablet Take 2 tablets (200 mg total) by mouth daily. For gouty arthritis 12/20/14   Encarnacion Slates, NP  aspirin EC 81 MG tablet Take 1 tablet (81 mg total) by mouth daily. For heart health 03/03/15   Norman Herrlich, MD  clopidogrel (PLAVIX) 75 MG tablet Take 1 tablet (75 mg total) by mouth daily. For prevention of stroke 03/20/15   Milagros Loll, MD  COMPLERA 200-25-300 MG TABS TAKE 1 TABLET BY MOUTH DAILY. 01/18/15   [name], MD  DULoxetine (CYMBALTA)  60 MG capsule TAKE 1 CAPSULE   BY MOUTH   DAILY 12/26/14   Norman Herrlich, MD  famotidine (PEPCID) 20 MG tablet Take 20 mg by mouth daily. 8pm    Historical Provider, MD  ibuprofen (ADVIL) 200 MG tablet Take 1 tablet (200 mg total) by mouth 3 (three) times daily. Every 8 hours for the next 3 days and then back to Q6 prn pain. 04/21/15   Burtis Junes, NP  Insulin Glargine (LANTUS SOLOSTAR) 100 UNIT/ML Solostar Pen Inject 15 Units into the skin at bedtime. 03/06/15   Charlesetta Shanks, MD  isosorbide mononitrate (IMDUR) 30 MG 24 hr tablet Take 1 tablet (30 mg total) by mouth daily. For chest pain 12/20/14   Encarnacion Slates, NP  lisinopril (PRINIVIL,ZESTRIL) 10 MG tablet Take 1 tablet (10 mg total) by mouth daily. For high blood pressure 03/03/15   Norman Herrlich, MD  loratadine (CLARITIN) 10 MG tablet Take 1 tablet (10 mg total) by mouth daily as needed for allergies. 04/26/15 04/25/16  Zada Finders, MD  metFORMIN (GLUCOPHAGE) 1000 MG tablet Take 1 tablet (1,000 mg total) by mouth 2 (two) times daily with a meal. For diabetes management 12/20/14   Encarnacion Slates, NP  metoprolol succinate (TOPROL-XL) 25 MG 24 hr tablet 50 mg.  04/20/15   Historical Provider, MD  pravastatin (PRAVACHOL) 40 MG tablet Take 1 tablet (40 mg total) by mouth daily. For high cholesterol 12/20/14   Encarnacion Slates, NP   BP 95/65 mmHg  Pulse 78  Temp(Src) 98.5 F (36.9 C) (Oral)  Resp 18  Ht 5\' 7"  (1.702 m)  Wt 177 lb (80.287 kg)  BMI 27.72 kg/m2  SpO2 100% Physical Exam  Constitutional: He is oriented to person, place, and time. He appears well-developed and well-nourished.  HENT:  Head: Normocephalic and atraumatic.  Cardiovascular: Normal rate and regular rhythm.   No murmur heard. Pulmonary/Chest: Effort normal and breath sounds normal. No respiratory distress.  TTP over left upper chest wall  Abdominal: Soft. There is no tenderness. There is no rebound and no guarding.  Musculoskeletal: He exhibits no edema or tenderness.   Neurological: He is alert and oriented to person, place, and time.  Skin: Skin is warm and dry.  Psychiatric: He has a normal mood and affect. His behavior is normal.  Nursing note and vitals reviewed.   ED Course  Procedures (including critical care time) Labs Review Labs Reviewed  BASIC METABOLIC PANEL - Abnormal; Notable for the following:    Glucose,  Bld 155 (*)    Creatinine, Ser 1.28 (*)    GFR calc non Af Amer 57 (*)    Anion gap 4 (*)    All other components within normal limits  CBC WITH DIFFERENTIAL/PLATELET - Abnormal; Notable for the following:    WBC 3.5 (*)    RBC 3.82 (*)    Hemoglobin 10.7 (*)    HCT 33.0 (*)    Neutro Abs 1.6 (*)    All other components within normal limits  CBG MONITORING, ED - Abnormal; Notable for the following:    Glucose-Capillary 153 (*)    All other components within normal limits    Imaging Review Dg Chest 2 View  05/03/2015   CLINICAL DATA:  Shortness of breath and wheezing for 3 weeks.  EXAM: CHEST  2 VIEW  COMPARISON:  April 19, 2015  FINDINGS: Lungs are clear. The heart size and pulmonary vascularity are normal. No adenopathy. There is a stent in the left anterior descending coronary artery. There is mild degenerative change in the thoracic spine.  IMPRESSION: No edema or consolidation.   Electronically Signed   By: Lowella Grip III M.D.   On: 05/03/2015 16:54     EKG Interpretation   Date/Time:  Wednesday May 03 2015 17:02:51 EDT Ventricular Rate:  76 PR Interval:  142 QRS Duration: 88 QT Interval:  358 QTC Calculation: 402 R Axis:   69 Text Interpretation:  Sinus rhythm with Premature atrial complexes  Nonspecific T wave abnormality Abnormal ECG Confirmed by Hazle Coca [phone])  on 05/03/2015 5:55:39 PM      MDM   Final diagnoses:  Hyperglycemia due to type 2 diabetes mellitus    Pt here for evaluated blood sugar, improved in department. Pt without acute complaints - has chronic SOB and left upper chest  discomfort.  Clinical picture not c/w DKA, ACS, Dissection, pna.  BMP with stable renal insuffiencey, CBC with stable anemia and leukopenia.  Discussed with pt diabetic diet, continue current medications as prescribed.     Quintella Reichert, MD 05/03/15 [phone]

## 2015-05-03 NOTE — ED Notes (Signed)
BS high today-BS 403 at 330pm-pt is at [facility] x 90 days for crack use

## 2015-05-15 ENCOUNTER — Other Ambulatory Visit: Payer: Self-pay | Admitting: Infectious Diseases

## 2015-05-19 ENCOUNTER — Ambulatory Visit: Payer: Self-pay | Admitting: Cardiovascular Disease

## 2015-05-31 ENCOUNTER — Encounter: Payer: Self-pay | Admitting: Internal Medicine

## 2015-06-07 ENCOUNTER — Encounter: Payer: Self-pay | Admitting: Internal Medicine

## 2015-06-09 DIAGNOSIS — E1165 Type 2 diabetes mellitus with hyperglycemia: Secondary | ICD-10-CM | POA: Diagnosis not present

## 2015-06-09 DIAGNOSIS — F1410 Cocaine abuse, uncomplicated: Secondary | ICD-10-CM | POA: Diagnosis not present

## 2015-06-09 DIAGNOSIS — R45851 Suicidal ideations: Secondary | ICD-10-CM | POA: Diagnosis not present

## 2015-06-09 DIAGNOSIS — E089 Diabetes mellitus due to underlying condition without complications: Secondary | ICD-10-CM | POA: Diagnosis not present

## 2015-06-10 IMAGING — CT CT MAXILLOFACIAL W/ CM
3 of 4 series · 14 of 47 positions shown, 17 images · IV contrast (Iodine)
Comparison: CT of the head and cervical spine performed 04/25/2014

CLINICAL DATA: Large left-sided cheek abscess.

EXAM:
CT MAXILLOFACIAL WITH CONTRAST
TECHNIQUE: Multidetector CT imaging of the maxillofacial structures was
performed with intravenous contrast. Multiplanar CT image
reconstructions were also generated. A small metallic BB was placed
on the right temple in order to reliably differentiate right from
left.
CONTRAST:  75mL OMNIPAQUE IOHEXOL 300 MG/ML  SOLN

[Series 201: facial bones · axial · 0.43mm/px · z∈[+895,+1019]mm · 9 of 72 slices shown, 12 images]
[im 5/72  brain]
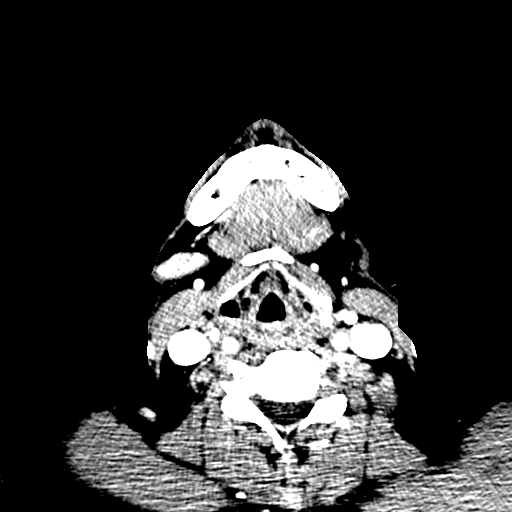
[im 5/72  bone]
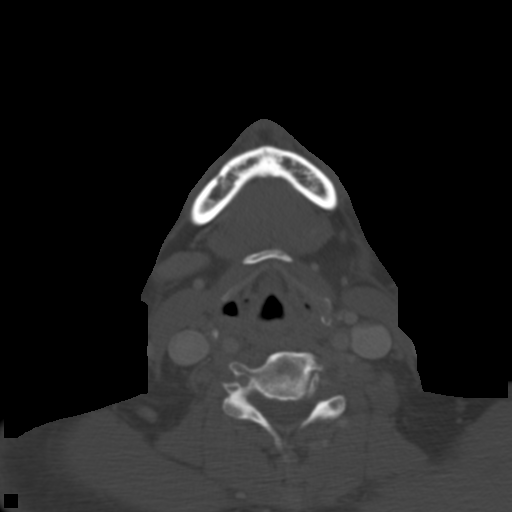
[im 13/72  bone]
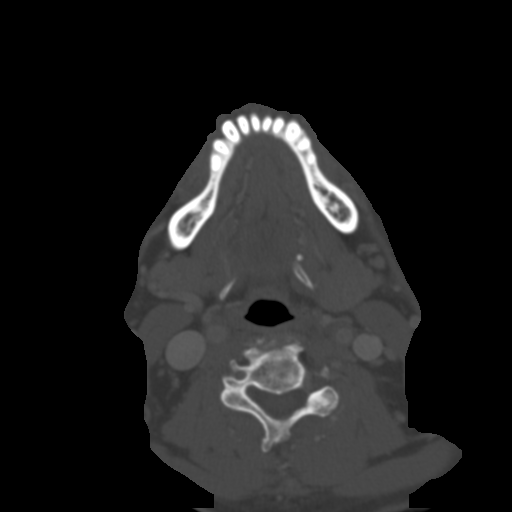
[im 20/72  bone]
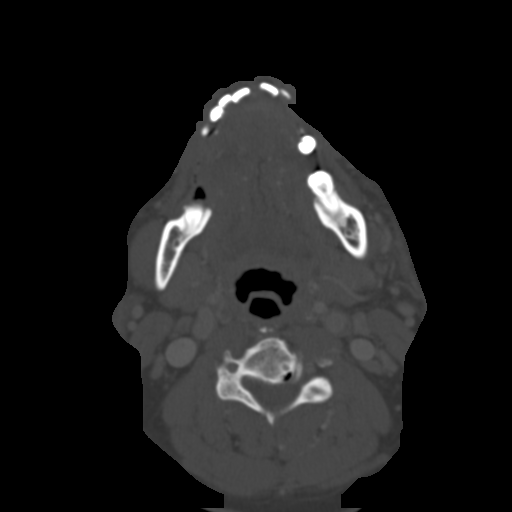
[im 27/72  bone]
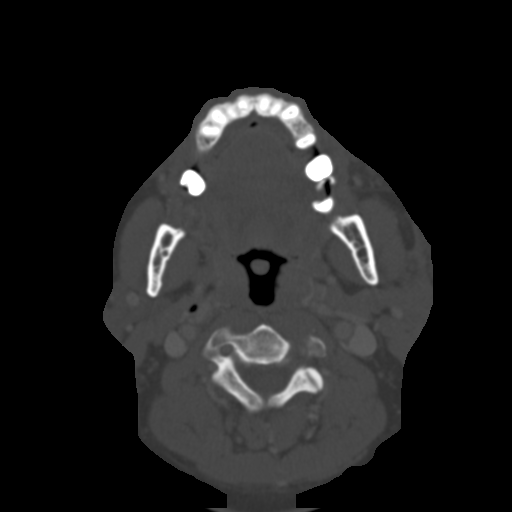
[im 37/72  brain]
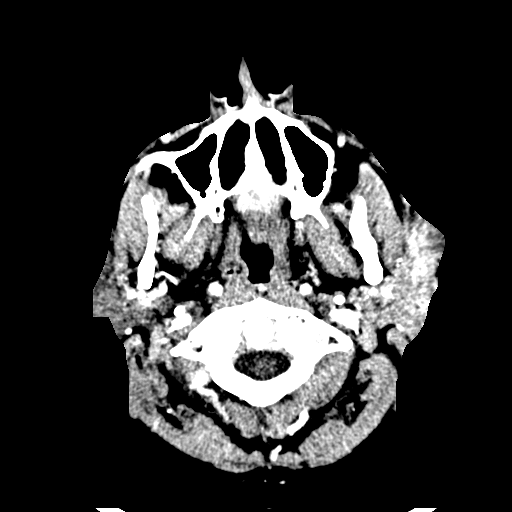
[im 37/72  bone]
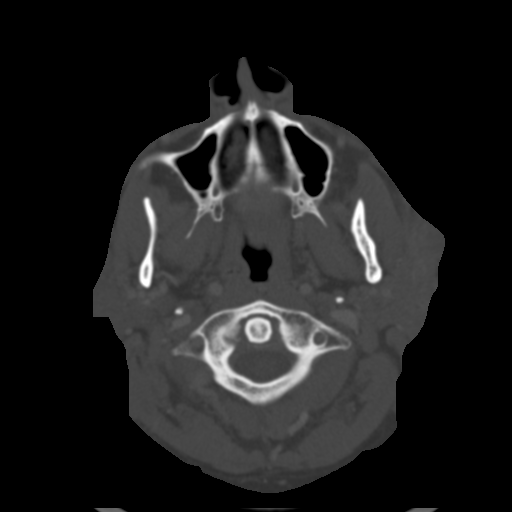
[im 45/72  bone]
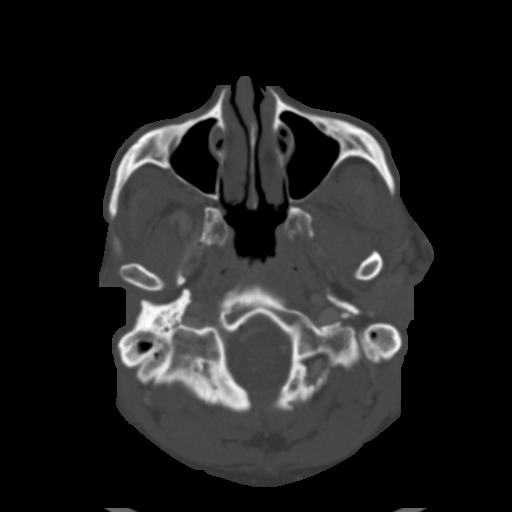
[im 52/72  bone]
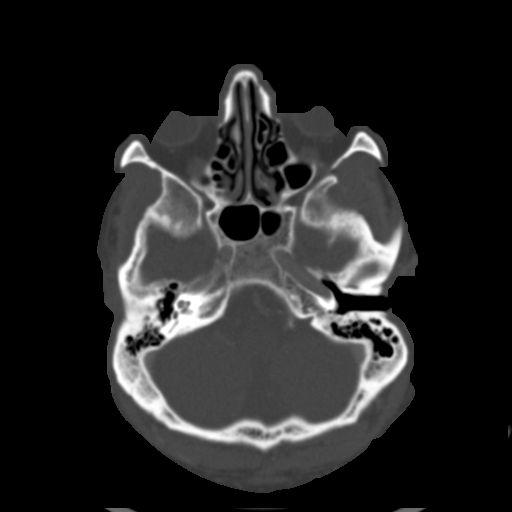
[im 59/72  bone]
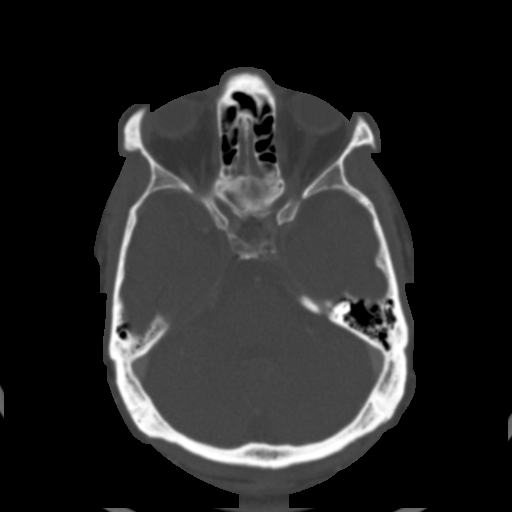
[im 67/72  brain]
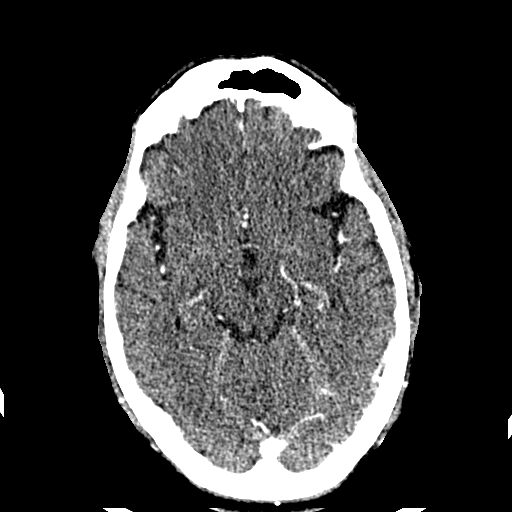
[im 67/72  bone]
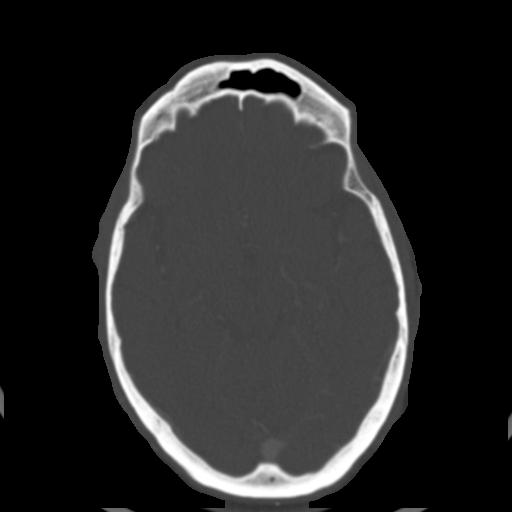

[Series 209: cor bone · coronal · 0.43mm/px · 2 of 56 slices shown]
[im 19/56  bone]
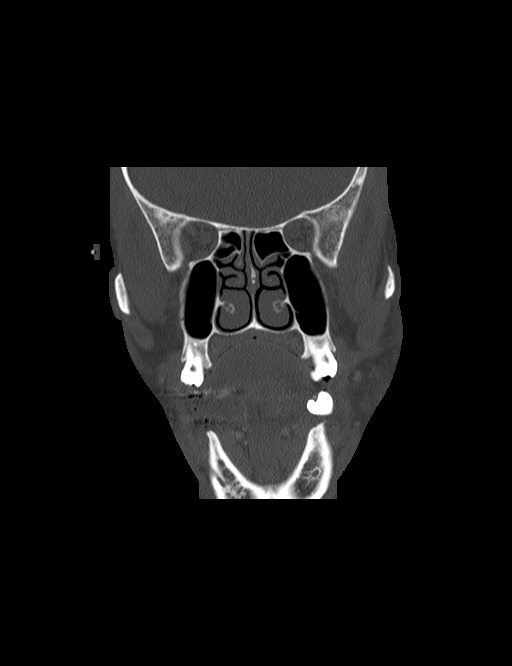
[im 37/56  bone]
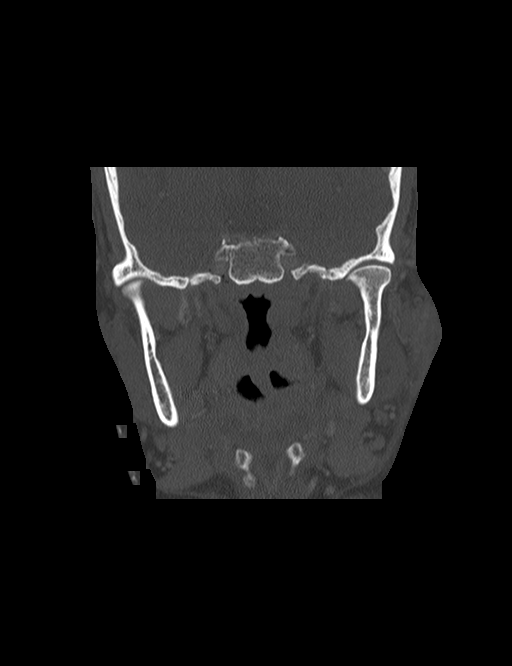

[Series 2011: sag st · sagittal · 0.43mm/px · 3 of 83 slices shown]
[im 28/83  bone]
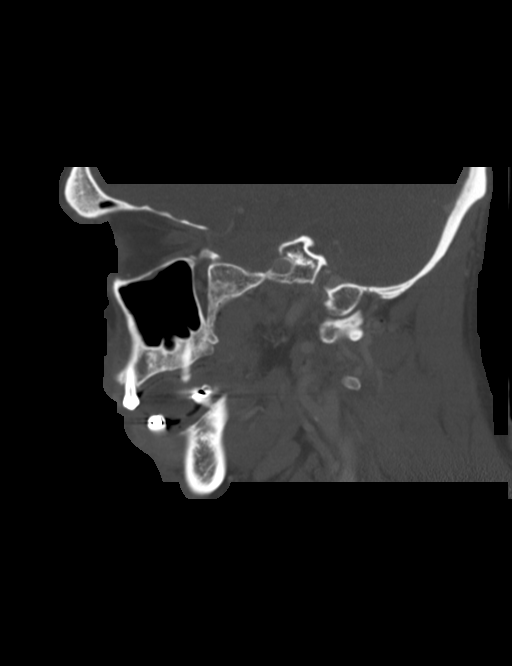
[im 42/83  bone]
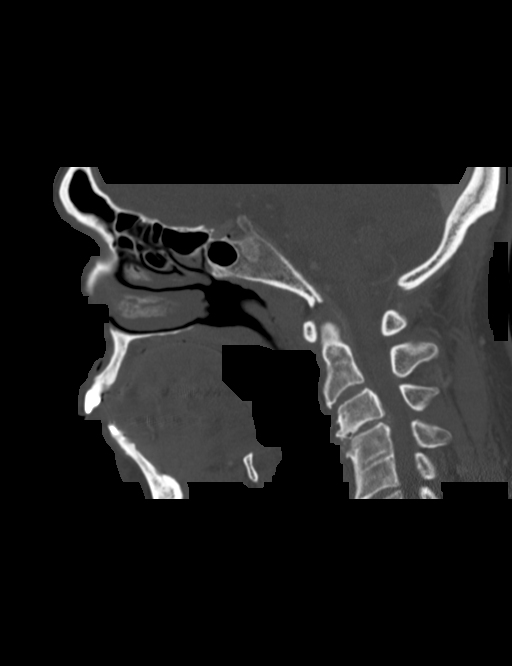
[im 55/83  bone]
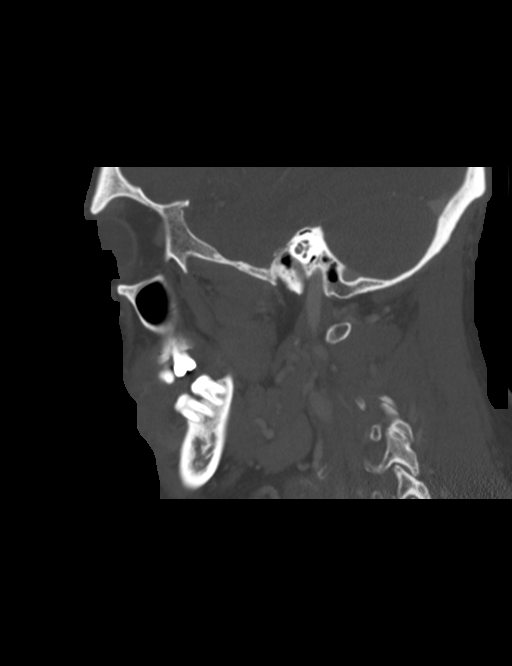

[14 of 47 positions shown; findings below may reference images not displayed]

FINDINGS: There is a 1.9 x 1.4 cm collection of fluid within the subcutaneous
soft tissues overlying the left lateral mandible, with minimal
peripheral enhancement and significant associated soft tissue
inflammation. An associated skin defect is seen. Soft tissue
inflammation extends inferiorly over the left mandible and adjacent
to the left parotid gland. A tiny subcutaneous collection of fluid
is noted more anteriorly and inferiorly, measuring approximately
x 0.3 cm, with mild associated soft tissue inflammation.

There is no evidence of fracture or dislocation. The maxilla and
mandible appear intact. The nasal bone is unremarkable in
appearance. The visualized dentition demonstrates no acute
abnormality. There is chronic absence of multiple maxillary and
mandibular teeth. There is chronic osseous fusion at C4-C5, with
associated degenerative change.

The orbits are intact bilaterally. The visualized paranasal sinuses
and mastoid air cells are well-aerated.

The parapharyngeal fat planes are preserved. The nasopharynx,
oropharynx and hypopharynx are unremarkable in appearance. The
visualized portions of the valleculae and piriform sinuses are
grossly unremarkable.

The parotid and submandibular glands are otherwise within normal
limits. No cervical lymphadenopathy is seen. The visualized portions
of the brain are unremarkable in appearance.
IMPRESSION: 1. 1.9 x 1.4 cm collection of fluid within the subcutaneous tissues
overlying the left lateral mandible, with minimal peripheral
enhancement and significant associated soft tissue inflammation,
compatible with a focal abscess. Associated skin defect seen. Soft
tissue inflammation extends inferiorly over the left mandible and
adjacent to the left parotid gland.
2. Tiny subcutaneous collection of fluid more anteriorly and
inferiorly, measuring 0.7 x 0.3 cm, with mild associated soft tissue
inflammation, compatible with a tiny evolving abscess.
3. Chronic osseous fusion at C4-C5, with associated degenerative
change.
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
## 2015-07-08 DIAGNOSIS — M549 Dorsalgia, unspecified: Secondary | ICD-10-CM | POA: Diagnosis not present

## 2015-07-08 DIAGNOSIS — I2510 Atherosclerotic heart disease of native coronary artery without angina pectoris: Secondary | ICD-10-CM | POA: Diagnosis not present

## 2015-07-08 DIAGNOSIS — E119 Type 2 diabetes mellitus without complications: Secondary | ICD-10-CM | POA: Diagnosis not present

## 2015-07-08 DIAGNOSIS — J449 Chronic obstructive pulmonary disease, unspecified: Secondary | ICD-10-CM | POA: Diagnosis not present

## 2015-07-08 DIAGNOSIS — J45909 Unspecified asthma, uncomplicated: Secondary | ICD-10-CM | POA: Diagnosis not present

## 2015-07-08 DIAGNOSIS — J3489 Other specified disorders of nose and nasal sinuses: Secondary | ICD-10-CM | POA: Diagnosis not present

## 2015-07-08 DIAGNOSIS — R05 Cough: Secondary | ICD-10-CM | POA: Diagnosis not present

## 2015-07-08 DIAGNOSIS — I420 Dilated cardiomyopathy: Secondary | ICD-10-CM | POA: Diagnosis not present

## 2015-07-08 DIAGNOSIS — R0982 Postnasal drip: Secondary | ICD-10-CM | POA: Diagnosis not present

## 2015-07-08 DIAGNOSIS — Z79899 Other long term (current) drug therapy: Secondary | ICD-10-CM | POA: Diagnosis not present

## 2015-07-08 DIAGNOSIS — I10 Essential (primary) hypertension: Secondary | ICD-10-CM | POA: Diagnosis not present

## 2015-07-08 DIAGNOSIS — J029 Acute pharyngitis, unspecified: Secondary | ICD-10-CM | POA: Diagnosis not present

## 2015-07-08 DIAGNOSIS — B20 Human immunodeficiency virus [HIV] disease: Secondary | ICD-10-CM | POA: Diagnosis not present

## 2015-07-08 DIAGNOSIS — G8929 Other chronic pain: Secondary | ICD-10-CM | POA: Diagnosis not present

## 2015-07-08 DIAGNOSIS — R0981 Nasal congestion: Secondary | ICD-10-CM | POA: Diagnosis not present

## 2015-07-10 ENCOUNTER — Encounter: Payer: Self-pay | Admitting: Internal Medicine

## 2015-07-10 ENCOUNTER — Ambulatory Visit (INDEPENDENT_AMBULATORY_CARE_PROVIDER_SITE_OTHER): Payer: Medicare Other | Admitting: Internal Medicine

## 2015-07-10 VITALS — BP 138/83 | HR 96 | Temp 98.0°F | Ht 67.5 in | Wt 181.4 lb

## 2015-07-10 DIAGNOSIS — I10 Essential (primary) hypertension: Secondary | ICD-10-CM

## 2015-07-10 DIAGNOSIS — R059 Cough, unspecified: Secondary | ICD-10-CM

## 2015-07-10 DIAGNOSIS — I2510 Atherosclerotic heart disease of native coronary artery without angina pectoris: Secondary | ICD-10-CM | POA: Diagnosis not present

## 2015-07-10 DIAGNOSIS — M1A9XX0 Chronic gout, unspecified, without tophus (tophi): Secondary | ICD-10-CM | POA: Diagnosis not present

## 2015-07-10 DIAGNOSIS — E1165 Type 2 diabetes mellitus with hyperglycemia: Secondary | ICD-10-CM | POA: Diagnosis not present

## 2015-07-10 DIAGNOSIS — E1142 Type 2 diabetes mellitus with diabetic polyneuropathy: Secondary | ICD-10-CM | POA: Diagnosis not present

## 2015-07-10 DIAGNOSIS — M542 Cervicalgia: Secondary | ICD-10-CM

## 2015-07-10 DIAGNOSIS — R05 Cough: Secondary | ICD-10-CM | POA: Diagnosis not present

## 2015-07-10 DIAGNOSIS — N528 Other male erectile dysfunction: Secondary | ICD-10-CM

## 2015-07-10 DIAGNOSIS — E1140 Type 2 diabetes mellitus with diabetic neuropathy, unspecified: Secondary | ICD-10-CM

## 2015-07-10 DIAGNOSIS — Z794 Long term (current) use of insulin: Secondary | ICD-10-CM | POA: Diagnosis not present

## 2015-07-10 DIAGNOSIS — M109 Gout, unspecified: Secondary | ICD-10-CM

## 2015-07-10 DIAGNOSIS — B20 Human immunodeficiency virus [HIV] disease: Secondary | ICD-10-CM | POA: Insufficient documentation

## 2015-07-10 DIAGNOSIS — M5416 Radiculopathy, lumbar region: Secondary | ICD-10-CM

## 2015-07-10 DIAGNOSIS — N529 Male erectile dysfunction, unspecified: Secondary | ICD-10-CM

## 2015-07-10 DIAGNOSIS — IMO0002 Reserved for concepts with insufficient information to code with codable children: Secondary | ICD-10-CM

## 2015-07-10 DIAGNOSIS — Z21 Asymptomatic human immunodeficiency virus [HIV] infection status: Secondary | ICD-10-CM

## 2015-07-10 LAB — GLUCOSE, CAPILLARY: GLUCOSE-CAPILLARY: 155 mg/dL — AB (ref 65–99)

## 2015-07-10 LAB — POCT GLYCOSYLATED HEMOGLOBIN (HGB A1C): Hemoglobin A1C: 9.1

## 2015-07-10 MED ORDER — ALLOPURINOL 100 MG PO TABS: 200.0000 mg | ORAL_TABLET | Freq: Every day | ORAL | Status: DC

## 2015-07-10 MED ORDER — LOSARTAN POTASSIUM 50 MG PO TABS
50.0000 mg | ORAL_TABLET | Freq: Every day | ORAL | Status: DC
Start: 2015-07-10 — End: 2015-08-15

## 2015-07-10 MED ORDER — CLOPIDOGREL BISULFATE 75 MG PO TABS: 75.0000 mg | ORAL_TABLET | Freq: Every day | ORAL | Status: DC

## 2015-07-10 MED ORDER — EMTRICITAB-RILPIVIR-TENOFOV DF 200-25-300 MG PO TABS: 1.0000 | ORAL_TABLET | Freq: Every day | ORAL | Status: DC

## 2015-07-10 MED ORDER — ISOSORBIDE MONONITRATE ER 30 MG PO TB24: 30.0000 mg | ORAL_TABLET | Freq: Every day | ORAL | Status: DC

## 2015-07-10 MED ORDER — METOPROLOL SUCCINATE ER 25 MG PO TB24: 50.0000 mg | ORAL_TABLET | Freq: Every day | ORAL | Status: DC
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
## 2015-07-10 NOTE — Assessment & Plan Note (Signed)
No recent gout flares. Refilled allopurinol.

## 2015-07-10 NOTE — Assessment & Plan Note (Addendum)
Mr. Mahajan has had a longstanding dry cough that has been worsening over the past 5 days. His symptoms are largely unchanged since his [facility] clinic visit in July. He says that he took the course of Zithromax but that it was ineffective. He says he has not been taking his Claritin every day. He denies any fevers or signs of acute infection. He says that every time he coughs, it hurts his lower back and makes him feel unbalanced. His lower back pain and lumbar radiculopathy are a long-standing issue.While he says his cough is productive, this could be a dry cough with superimposed allergies and post-nasal drip. He is followed by [name] for management of his HIV. His last RNA quantitative was <20. - Will transition from Lisinopril to Losartan - Encourage use of Loratadine.

## 2015-07-10 NOTE — Assessment & Plan Note (Signed)
Michael [name] appears to be confused about which medications he is taking. He says he is not taking Plavix. I explained what this does for his heart, that it will decrease his risk of a heart attack. - Refilled Plavix

## 2015-07-10 NOTE — Assessment & Plan Note (Signed)
It appears stable from previous. See "Lumbar Radiculopathy" for additional details. Will refer to [facility].

## 2015-07-10 NOTE — Assessment & Plan Note (Signed)
Patient reports difficulty obtaining an erection for intercourse. However, he is able to obtain a complete erection upon waking. Explained to him that his symptoms may be largely psychological, and that a medication like Viagra, which would interact with his HTN and CAD medications, would likely be ineffective and are contraindicated.

## 2015-07-10 NOTE — Assessment & Plan Note (Signed)
Hemoglobin A1C still elevated at 9.1, and increase from 8.4 four months prior. Says he is adherent with insulin and metformin regimen. I spoke with Michael [name] on the role of diet in controlling his diabetes - Will consider increasing lantus dose to 20u at followup visit.

## 2015-07-10 NOTE — Progress Notes (Signed)
   Subjective:    Patient ID: Michael [name], male    DOB: [birth year], 64 y.o.   MRN: [id number]   Chief Complaint:  Back pain  HPI  Michael [name] is a 63 year old man who presents with back pain. See problem-based assessment and plan for details.  Review of Systems  Constitutional: Negative for chills and appetite change.  HENT: Positive for congestion, postnasal drip, rhinorrhea, sinus pressure and sore throat. Negative for ear pain and facial swelling.   Eyes: Positive for visual disturbance. Negative for pain.  Respiratory: Positive for cough, chest tightness and shortness of breath.   Cardiovascular: Negative for chest pain and leg swelling.  Gastrointestinal: Negative for abdominal pain, diarrhea, constipation, blood in stool and rectal pain.  Genitourinary: Negative for dysuria, hematuria, difficulty urinating and testicular pain.       Erectile dysfunction.  Musculoskeletal: Positive for back pain, gait problem and neck pain.  Skin: Negative for rash.  Allergic/Immunologic: Positive for environmental allergies.  Neurological: Positive for dizziness, weakness and light-headedness. Negative for numbness.  Psychiatric/Behavioral: Negative for dysphoric mood.       Objective:   Physical Exam  Constitutional: He is oriented to person, place, and time. He appears well-developed and well-nourished. He appears distressed.  HENT:  Mild tonsillar erythema. No exudates.  Eyes: EOM are normal. Pupils are equal, round, and reactive to light. No scleral icterus.  Neck: No JVD present.  Cardiovascular: Normal rate, regular rhythm and normal heart sounds.   No murmur heard. Pulmonary/Chest: Effort normal and breath sounds normal. No stridor. No respiratory distress. He has no wheezes.  Coughs with deep inspiration. No phlegm with cough.  Abdominal: Soft. Bowel sounds are normal. He exhibits no distension. There is no tenderness.  Musculoskeletal:  Neck pain when turning head to left  against resistance. Pain on palpation of lumbar spine. Trace edema at ankles.  Lymphadenopathy:    He has no cervical adenopathy.  Neurological: He is alert and oriented to person, place, and time. No cranial nerve deficit.  Normal gait, but notably limited by lower back pain.   Skin: No rash noted.  Psychiatric: He has a normal mood and affect.          Assessment & Plan:  See problem-based assessment and plan for details.

## 2015-07-10 NOTE — Assessment & Plan Note (Addendum)
Blood pressure controlled 138/83 today. Patient says he has not been taking his Imdur or metoprolol. The metoprolol was discontinued at his last visit due to history of cocaine abuse. He reports that he has not been using any drugs of abuse. Have transitioned from lisinopril to losartan 50 mg daily. (see "Cough").  - Refilled imdur - Added losartan
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
## 2015-07-10 NOTE — Assessment & Plan Note (Signed)
Managed by [name] in [facility]. Patient is informed me that he was almost out of [place]. I have provided a refill, given that he does not have an appointment with the [facility] until December.

## 2015-07-14 NOTE — Progress Notes (Signed)
Internal Medicine Clinic Attending  I saw and evaluated the patient.  I personally confirmed the key portions of the history and exam documented by Dr. Ford and I reviewed pertinent patient test results.  The assessment, diagnosis, and plan were formulated together and I agree with the documentation in the resident's note. 

## 2015-07-18 ENCOUNTER — Encounter: Payer: Self-pay | Admitting: Internal Medicine

## 2015-07-18 ENCOUNTER — Ambulatory Visit (INDEPENDENT_AMBULATORY_CARE_PROVIDER_SITE_OTHER): Payer: Medicare Other | Admitting: Internal Medicine

## 2015-07-18 ENCOUNTER — Ambulatory Visit (HOSPITAL_COMMUNITY)
Admission: RE | Admit: 2015-07-18 | Discharge: 2015-07-18 | Disposition: A | Discharge status: Home / Self Care | Payer: Medicare Other | Source: Ambulatory Visit | Attending: Internal Medicine | Admitting: Internal Medicine

## 2015-07-18 ENCOUNTER — Telehealth: Payer: Self-pay | Admitting: *Deleted

## 2015-07-18 VITALS — BP 129/57 | HR 79 | Temp 98.3°F | Ht 67.5 in | Wt 187.8 lb

## 2015-07-18 DIAGNOSIS — R059 Cough, unspecified: Secondary | ICD-10-CM

## 2015-07-18 DIAGNOSIS — E1165 Type 2 diabetes mellitus with hyperglycemia: Secondary | ICD-10-CM

## 2015-07-18 DIAGNOSIS — R05 Cough: Secondary | ICD-10-CM | POA: Diagnosis not present

## 2015-07-18 DIAGNOSIS — E1140 Type 2 diabetes mellitus with diabetic neuropathy, unspecified: Secondary | ICD-10-CM

## 2015-07-18 DIAGNOSIS — R918 Other nonspecific abnormal finding of lung field: Secondary | ICD-10-CM | POA: Insufficient documentation

## 2015-07-18 DIAGNOSIS — M5416 Radiculopathy, lumbar region: Secondary | ICD-10-CM | POA: Diagnosis not present

## 2015-07-18 DIAGNOSIS — IMO0002 Reserved for concepts with insufficient information to code with codable children: Secondary | ICD-10-CM

## 2015-07-18 LAB — GLUCOSE, CAPILLARY: GLUCOSE-CAPILLARY: 228 mg/dL — AB (ref 65–99)

## 2015-07-18 MED ORDER — HYDROCODONE-HOMATROPINE 5-1.5 MG/5ML PO SYRP: 5.0000 mL | ORAL_SOLUTION | Freq: Four times a day (QID) | ORAL | Status: DC | PRN

## 2015-07-18 NOTE — Patient Instructions (Signed)
1. Please come back for follow up visit after you are seen by neurosurgery at [facility] or sooner if you feel this is necessary.   2. Please take all medications as previously prescribed with the following changes:  Please obtain a chest XRAY for your cough upstairs on the first floor in radiology.   Take Hycodan syrup every 8 hours ONLY as needed for cough.   3. If you have worsening of your symptoms or new symptoms arise, please call the clinic [phone] or go to the ER immediately if symptoms are severe.

## 2015-07-18 NOTE — Telephone Encounter (Signed)
CALLED OFFICE FOR STATUS OF REFERRAL/ RECORDS IN REVIEW, DR. [place].

## 2015-07-18 NOTE — Progress Notes (Signed)
Subjective:   Patient ID: Michael [name] male   DOB: 1951-09-16 64 y.o.   MRN: [id number]  HPI: [name] is a 64 y.o. male w/ PMHx of DM type II, HTN, HLD, Gout, HIV, substance abuse, CAD, and depression, presents to the clinic today for an acute visit for LE pain. Patient states this is not new but he feels like his legs are swelling which he has not noticed in the past. He describes the pain as burning, sometimes sharp, starting in the lower back and radiating down into his legs. He feels that his calves are "tight" and his ankles are somewhat swollen. Patient has known cardiac disease as well as known lumbar spinal abnormalities for which he has had surgery in the past. No saddle anesthesia or incontinence.   Patient also complains of cough for which he has had since almost July. He says there is nothing he has tried that makes it better. During his last visit, he was changed to ARB from ACEI. It was previously thought that this cough may be related to allergies.    Current Outpatient Prescriptions  Medication Sig Dispense Refill  . albuterol (PROVENTIL HFA;VENTOLIN HFA) 108 (90 BASE) MCG/ACT inhaler Inhale into the lungs every 6 (six) hours as needed for wheezing or shortness of breath.    . allopurinol (ZYLOPRIM) 100 MG tablet Take 2 tablets (200 mg total) by mouth daily. For gouty arthritis 60 tablet 3  . aspirin EC 81 MG tablet Take 1 tablet (81 mg total) by mouth daily. For heart health    . Azithromycin (ZITHROMAX PO) Take by mouth.    . clopidogrel (PLAVIX) 75 MG tablet Take 1 tablet (75 mg total) by mouth daily. For prevention of stroke 30 tablet 3  . clopidogrel (PLAVIX) 75 MG tablet Take 1 tablet (75 mg total) by mouth daily. For prevention of stroke 30 tablet 3  . DULoxetine (CYMBALTA) 60 MG capsule TAKE 1 CAPSULE   BY MOUTH   DAILY 30 capsule 6  . Emtricitab-Rilpivir-Tenofov DF (COMPLERA) 200-25-300 MG TABS Take 1 tablet by mouth daily. 30 tablet 3  . famotidine  (PEPCID) 20 MG tablet Take 20 mg by mouth daily. 8pm    . HYDROcodone-homatropine (HYCODAN) 5-1.5 MG/5ML syrup Take 5 mLs by mouth every 6 (six) hours as needed for cough. 120 mL 0  . ibuprofen (ADVIL) 200 MG tablet Take 1 tablet (200 mg total) by mouth 3 (three) times daily. Every 8 hours for the next 3 days and then back to Q6 prn pain. (Patient not taking: Reported on 07/10/2015) 30 tablet 0  . Insulin Glargine (LANTUS SOLOSTAR) 100 UNIT/ML Solostar Pen Inject 15 Units into the skin at bedtime. 15 pen 5  . isosorbide mononitrate (IMDUR) 30 MG 24 hr tablet Take 1 tablet (30 mg total) by mouth daily. For chest pain 30 tablet 6  . loratadine (CLARITIN) 10 MG tablet Take 1 tablet (10 mg total) by mouth daily as needed for allergies. (Patient not taking: Reported on 07/10/2015) 30 tablet 2  . losartan (COZAAR) 50 MG tablet Take 1 tablet (50 mg total) by mouth daily. 30 tablet 11  . metFORMIN (GLUCOPHAGE) 1000 MG tablet Take 1 tablet (1,000 mg total) by mouth 2 (two) times daily with a meal. For diabetes management    . pravastatin (PRAVACHOL) 40 MG tablet Take 1 tablet (40 mg total) by mouth daily. For high cholesterol 30 tablet    No current facility-administered medications for this visit.  Review of Systems: General: Denies fever, chills, diaphoresis, appetite change and fatigue.  Respiratory: Positive for cough. Denies SOB, DOE, and wheezing.   Cardiovascular: Denies chest pain and palpitations.  Gastrointestinal: Denies nausea, vomiting, abdominal pain, and diarrhea.  Genitourinary: Denies dysuria, increased frequency, and flank pain. Endocrine: Denies hot or cold intolerance, polyuria, and polydipsia. Musculoskeletal: Positive for back pain and LE pain. Denies myalgias, joint swelling, and gait problem.  Skin: Denies pallor, rash and wounds.  Neurological: Denies dizziness, seizures, syncope, weakness, lightheadedness, numbness and headaches.  Psychiatric/Behavioral: Denies mood changes,  and sleep disturbances.  Objective:   Physical Exam: Filed Vitals:   07/18/15 1355  BP: 129/57  Pulse: 79  Temp: 98.3 F (36.8 C)  TempSrc: Oral  Height: 5' 7.5" (1.715 m)  Weight: 187 lb 12.8 oz (85.186 kg)  SpO2: 100%    General: AA male, alert, cooperative, NAD. Intermittent dry cough.  HEENT: PERRL, EOMI. Moist mucus membranes Neck: Full range of motion without pain, supple, no lymphadenopathy or carotid bruits Lungs: Clear to ascultation bilaterally, normal work of respiration, no wheezes, rales, rhonchi Heart: RRR, no murmurs, gallops, or rubs Abdomen: Soft, non-tender, non-distended, BS + Extremities: No cyanosis, clubbing. Trace pitting edema. No TTP over calves, no erythema.  Neurologic: Alert & oriented X3, cranial nerves II-XII intact, strength grossly intact, sensation intact to light touch   Assessment & Plan:   Please see problem based assessment and plan.

## 2015-07-20 NOTE — Assessment & Plan Note (Signed)
Most likely cause of bilateral leg pain given history. Physical exam unremarkable, no focal deficits. Given referral for Neurosurgery at [facility] during previous visit, which is still pending.

## 2015-07-20 NOTE — Assessment & Plan Note (Addendum)
Consider as chronic at this time. Differential includes GERD, post-nasal drip, asthma, upper airway cough syndrome, non-asthmatic eosinophilic bronchitis, or psychogenic. Recently changed to ARB from ACEI, states he has not been taking the ACEI any longer. Is taking Pepcid, but only takes intermittently. No wheezes on exam, but does have h/o chronic asthma listed, uses albuterol prn for SOB. CXR performed, shows some findings suggestive of possible bronchitis, otherwise no significant acute abnormalities. Less likely infectious process at this point given extent of cough.  -Given Hycodan prn for cough to help with sleep -In future, could change to PPI to take qd, could consider PFT's and pulmonary consult, vs daily Claritin etc.  -RTC in 4-6 weeks

## 2015-07-22 NOTE — Progress Notes (Signed)
Internal Medicine Clinic Attending  Case discussed with Dr. Jones soon after the resident saw the patient.  We reviewed the resident's history and exam and pertinent patient test results.  I agree with the assessment, diagnosis, and plan of care documented in the resident's note. 

## 2015-07-25 ENCOUNTER — Encounter: Payer: Self-pay | Admitting: Licensed Clinical Social Worker

## 2015-07-26 ENCOUNTER — Encounter: Payer: Self-pay | Admitting: Internal Medicine

## 2015-08-04 ENCOUNTER — Other Ambulatory Visit: Payer: Self-pay | Admitting: Internal Medicine

## 2015-08-04 ENCOUNTER — Telehealth: Payer: Self-pay | Admitting: Internal Medicine

## 2015-08-04 IMAGING — CR DG CHEST 2V
2 series · 2 of 2 positions shown · non-contrast
Comparison: 04/10/2014

CLINICAL DATA: Short of breath

EXAM:
CHEST  2 VIEW

[w chest pa]
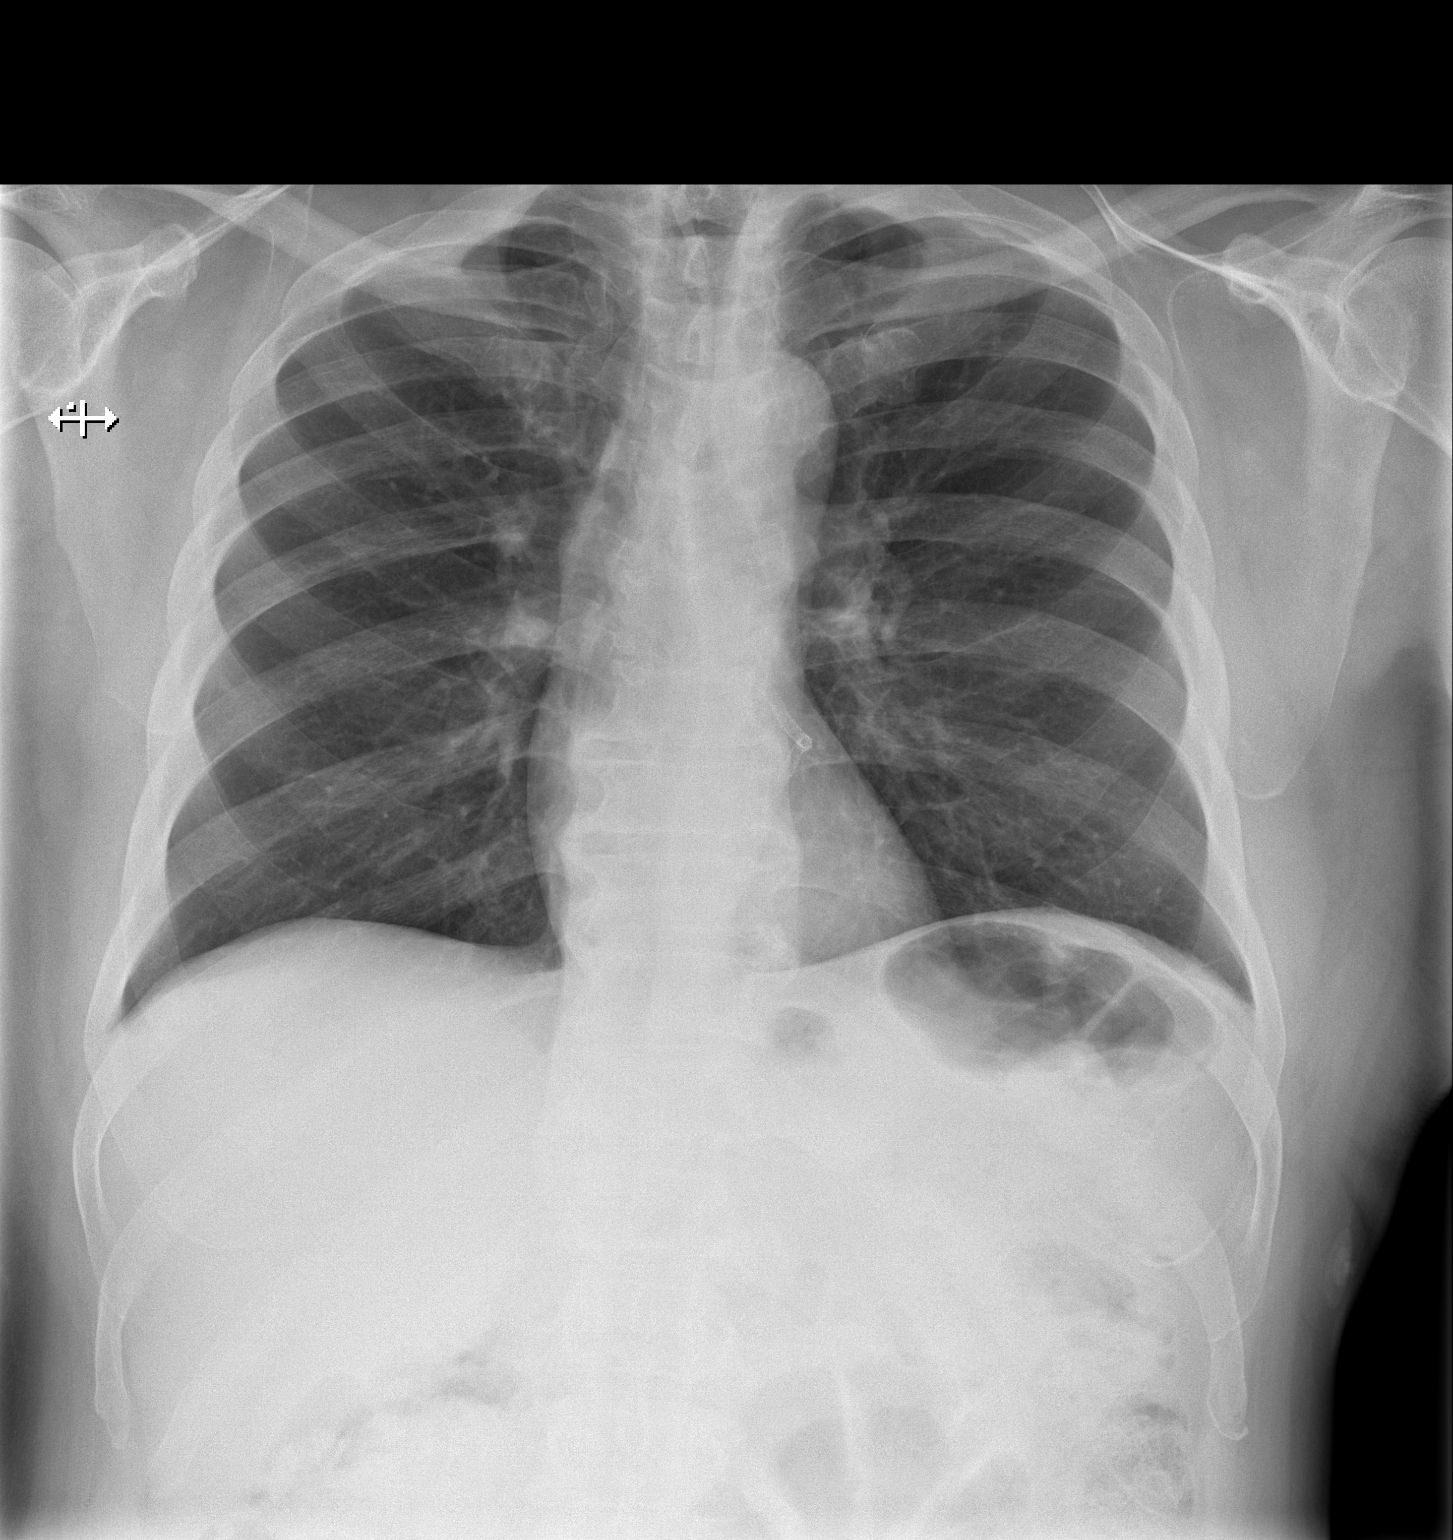

[w chest lat]
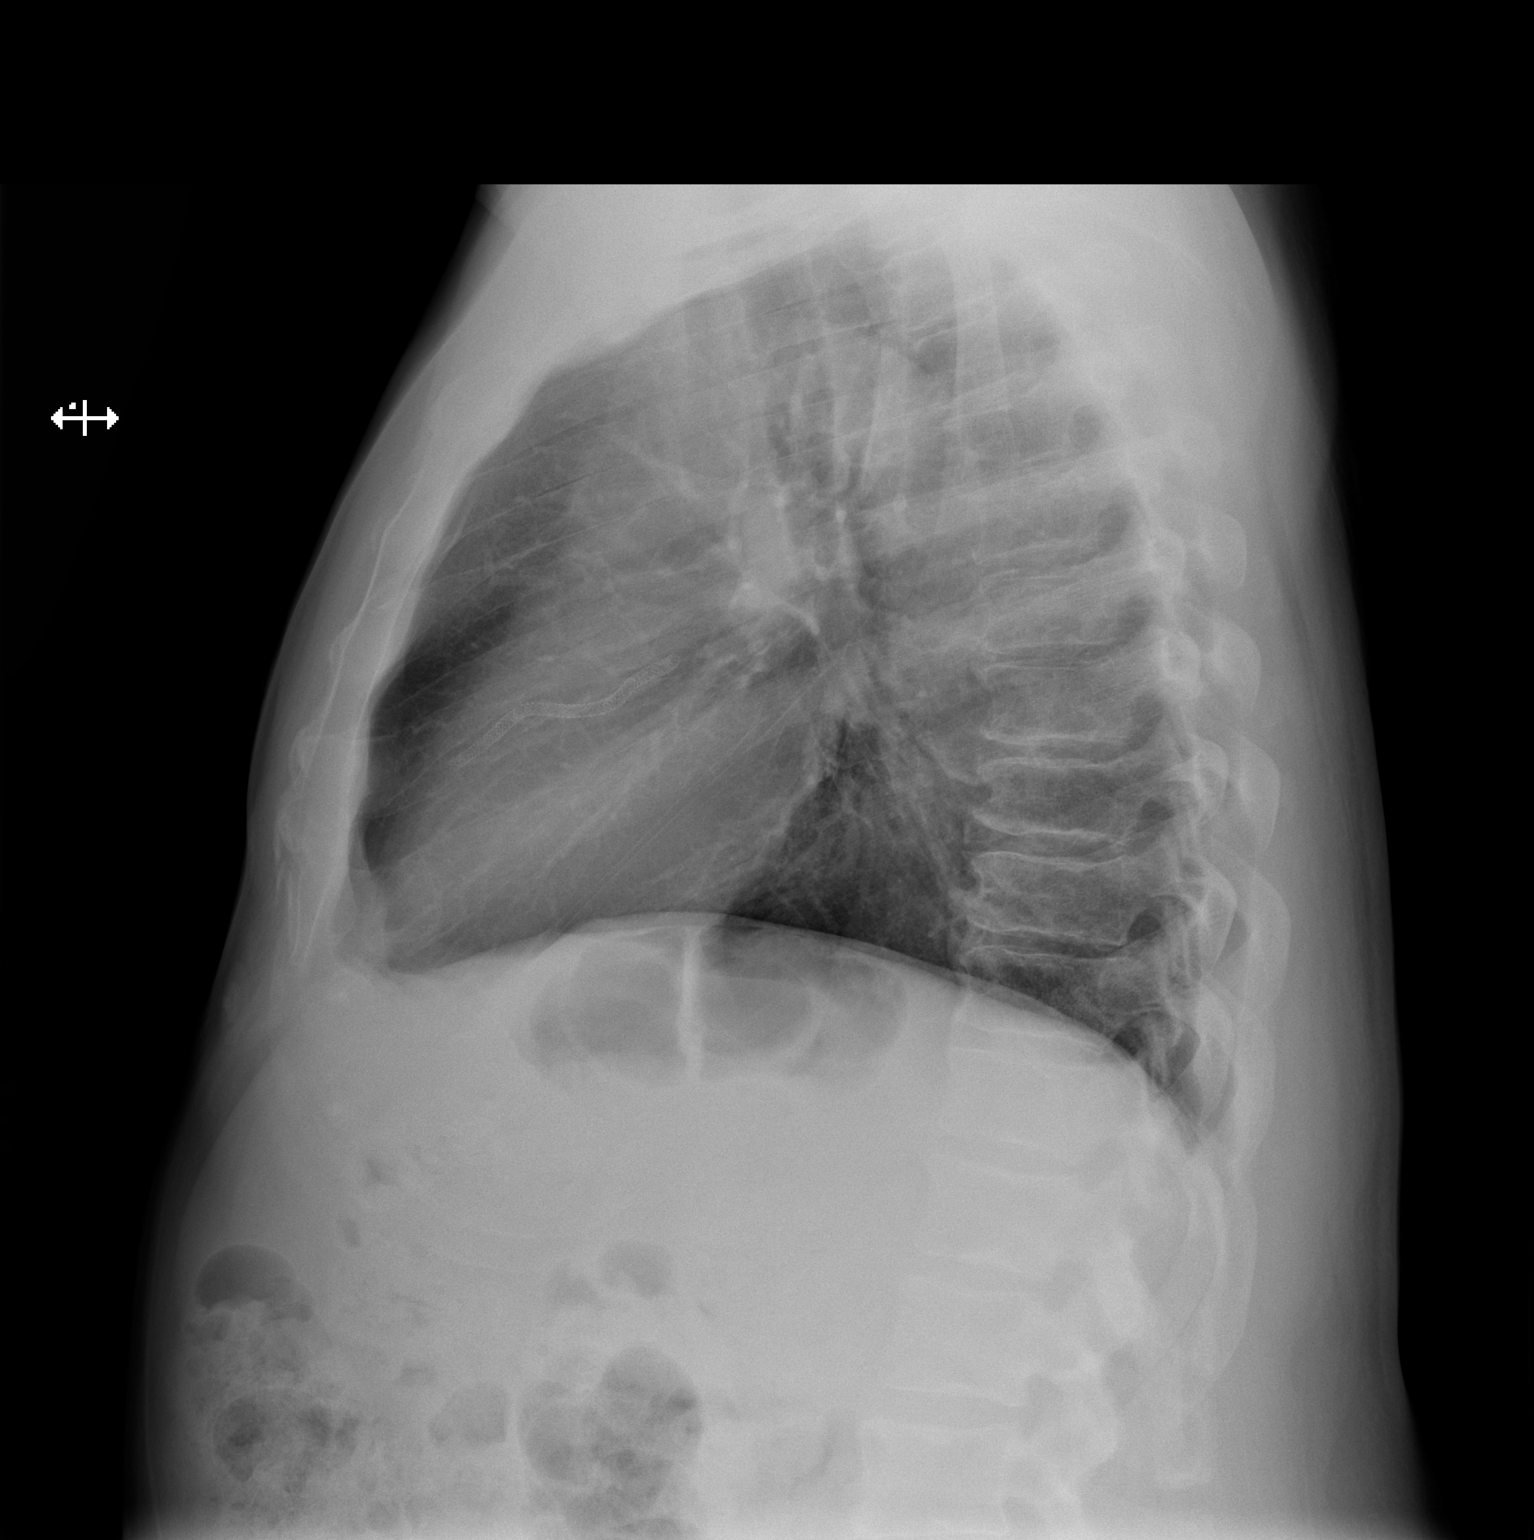

[2 of 2 positions shown; findings below may reference images not displayed]

FINDINGS: Normal mediastinum and cardiac silhouette. Normal pulmonary
vasculature. No evidence of effusion, infiltrate, or pneumothorax.
No acute bony abnormality. Coronary stents noted.
IMPRESSION: No acute cardiopulmonary process.

## 2015-08-04 NOTE — Telephone Encounter (Signed)
Message left on ID phone recording -  clinic called and will be here till 4:30PM

## 2015-08-04 NOTE — Telephone Encounter (Signed)
Talked with pt - aware of referral to [facility] from ID back in 03/2015. Will call [facility] for appt - there was a note if pt called them - they would make an appt. If too much time as laspe after referral was made - pt aware needs an appt in clinc to make referral. Delaynee Alred RN 08/04/15 4PM

## 2015-08-04 NOTE — Telephone Encounter (Signed)
Need appt with foot doctor to cut toe nails
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
## 2015-08-07 ENCOUNTER — Telehealth: Payer: Self-pay | Admitting: *Deleted

## 2015-08-07 NOTE — Telephone Encounter (Signed)
Patient requesting cymbalta 30mg , previously taking 60mg  for depression and pain. Signed rx for 30mg  as it is an appropriate taper. He has an appointment with me on 10/11/15 and can go over cymbalta medication.   [name], MD Internal Medicine Resident, PGY [facility] Pager: [phone]

## 2015-08-07 NOTE — Telephone Encounter (Signed)
LVM FOR PATIENT REGARDING HIS NEUROSURGERY APPT. November 21,016 AT [facility].[phone]. [address]

## 2015-08-10 ENCOUNTER — Other Ambulatory Visit: Payer: Self-pay | Admitting: Internal Medicine

## 2015-08-10 ENCOUNTER — Ambulatory Visit (INDEPENDENT_AMBULATORY_CARE_PROVIDER_SITE_OTHER): Payer: Medicare Other | Admitting: Internal Medicine

## 2015-08-10 ENCOUNTER — Encounter: Payer: Self-pay | Admitting: Internal Medicine

## 2015-08-10 VITALS — BP 120/80 | HR 74 | Temp 97.8°F | Ht 67.5 in | Wt 182.9 lb

## 2015-08-10 DIAGNOSIS — E1165 Type 2 diabetes mellitus with hyperglycemia: Secondary | ICD-10-CM | POA: Diagnosis not present

## 2015-08-10 DIAGNOSIS — Z7984 Long term (current) use of oral hypoglycemic drugs: Secondary | ICD-10-CM

## 2015-08-10 DIAGNOSIS — Z794 Long term (current) use of insulin: Secondary | ICD-10-CM

## 2015-08-10 DIAGNOSIS — R05 Cough: Secondary | ICD-10-CM

## 2015-08-10 DIAGNOSIS — E1140 Type 2 diabetes mellitus with diabetic neuropathy, unspecified: Secondary | ICD-10-CM

## 2015-08-10 DIAGNOSIS — R059 Cough, unspecified: Secondary | ICD-10-CM

## 2015-08-10 DIAGNOSIS — IMO0002 Reserved for concepts with insufficient information to code with codable children: Secondary | ICD-10-CM

## 2015-08-10 DIAGNOSIS — M5416 Radiculopathy, lumbar region: Secondary | ICD-10-CM

## 2015-08-10 LAB — GLUCOSE, CAPILLARY: Glucose-Capillary: 191 mg/dL — ABNORMAL HIGH (ref 65–99)

## 2015-08-10 NOTE — Progress Notes (Signed)
Internal Medicine Clinic Attending  Case discussed with Dr. Richardson soon after the resident saw the patient.  We reviewed the resident's history and exam and pertinent patient test results.  I agree with the assessment, diagnosis, and plan of care documented in the resident's note. 

## 2015-08-10 NOTE — Assessment & Plan Note (Addendum)
Patient has been followed by the nursing students at [facility] and noted his glucose had been running high 300-348 and recommended that he follow up with [facility]. Patient reports he has been out of his Metformin, but will pick it up today. He has been compliant with Lantus 15 units QHS. Previous notes suggest increasing Lantus to 20 units QHS; however, patient states his am glucose runs 98-140s. Last A1c was 9.1. He did not bring his meter or glucose log. Without these, I am hesitant to increase his Lantus with am CBGs in the 90s.   Plan: -Continue Lantus 15 units QHS -Consider increasing to 20 units QHS after review of glucose log -Or consider adding glp-1 agonist regardless or other oral agent -Continue Metformin 1000 mg BID -Referral to Podiatry Lab Results  Component Value Date   HGBA1C 9.1 07/10/2015   HGBA1C 8.4 03/03/2015   HGBA1C 8.2* 12/15/2014     Assessment: Diabetes control:  Uncontrolled Progress toward A1C goal:   Deteriorated Comments: See above  Plan: Medications:  continue current medications Home glucose monitoring: Frequency:  QID Timing:  ACHS Instruction/counseling given: discussed foot care and discussed diet Other plans: See above

## 2015-08-10 NOTE — Assessment & Plan Note (Signed)
Resolved.   Plan: -Continue hycodan prn cough -Consider PPI QD in the future if cough returns

## 2015-08-10 NOTE — Progress Notes (Signed)
Subjective:    Patient ID: Michael [name], male    DOB: 22-Aug-1951, 64 y.o.   MRN: [id number]  HPI Michael [name] is a 64 y.o. male with PMHx of HIV, chronic lumbar and cervical radiculopathy and T2DM who presents to the clinic for hyperglycemia. Please see A&P for the status of the patient's chronic medical problems.   Patient has been followed by the nursing students at [facility] and noted his glucose had been running high 300-348 and recommended that he follow up with [facility]. Patient reports he has been out of his Metformin, but will pick it up today. He has been compliant with Lantus 15 units QHS. Previous notes suggest increasing Lantus to 20 units QHS; however, patient states his am glucose runs 98-140s. Last A1c was 9.1. He did not bring his meter or glucose log. Without these, I am hesitant to increase his Lantus with am CBGs in the 90s.   Patient admits to chronic low back pain and chronic neck pain after a work injury in 1985 and a re-injury from a [place] in 2015. He has appointment with neurosurgery at [facility] on 08/17/15 for surgery. He walks with a cane and report weakness in his upper and lower extremities (L>R), normal sensation. No urinary or bowel incontinence.   He states his cough has resolved. The hycodan prn works very well.   Past Medical History  Diagnosis Date  . Diabetes mellitus     Diagnosed in 2002, started insulin in 2012  . Hypertension   . Headache(784.0)     CT head 08/2011: Periventricular and subcortical white matter hypodensities are most in keeping with chronic microangiopathic change  . Gout   . Hyperlipidemia   . HIV infection [facility]) Nov 2012    Followed by [name]  . Crack cocaine use     for 20+ years, has been enrolled in detox programs in the past  . Chondromalacia of medial femoral condyle     Left knee MRI 04/28/12: Chondromalacia of the medial femoral condyle with slight peripheral degeneration of the meniscocapsular junction of the  medial meniscus; followed by sports medicine  . Asthma     No PFTs, history of childhood asthma  . Depression     with history of hospitalization for suicidal ideation  . CAD (coronary artery disease)     a. 06/2013 STEMI/PCI ([facility]): LAD w/ thrombus (treated with BMS), mid 75%, D2 75%; LCX OM2 75%; RCA small, PDA 95%, PLV 95%;  b. 10/2013 Cath/PCI: ISR w/in LAD (Promus DES x 2), borderline OM2 lesion;  c. 01/2014 MV: Intermediate risk, medium-sized distal ant wall infarct w/ very small amt of peri-infarct ischemia. EF 60%.  . Gout 04/28/2012  . Collagen vascular disease ([place])   . Cellulitis 04/2014    left facial    Outpatient Encounter Prescriptions as of 08/10/2015  Medication Sig  . albuterol (PROVENTIL HFA;VENTOLIN HFA) 108 (90 BASE) MCG/ACT inhaler Inhale into the lungs every 6 (six) hours as needed for wheezing or shortness of breath.  . allopurinol (ZYLOPRIM) 100 MG tablet Take 2 tablets (200 mg total) by mouth daily. For gouty arthritis  . aspirin EC 81 MG tablet Take 1 tablet (81 mg total) by mouth daily. For heart health  . Azithromycin (ZITHROMAX PO) Take by mouth.  . clopidogrel (PLAVIX) 75 MG tablet Take 1 tablet (75 mg total) by mouth daily. For prevention of stroke  . clopidogrel (PLAVIX) 75 MG tablet Take 1 tablet (75 mg total) by  mouth daily. For prevention of stroke  . DULoxetine (CYMBALTA) 30 MG capsule Take 1 capsule (30 mg total) by mouth daily.  . Emtricitab-Rilpivir-Tenofov DF (COMPLERA) 200-25-300 MG TABS Take 1 tablet by mouth daily.  . famotidine (PEPCID) 20 MG tablet Take 20 mg by mouth daily. 8pm  . HYDROcodone-homatropine (HYCODAN) 5-1.5 MG/5ML syrup Take 5 mLs by mouth every 6 (six) hours as needed for cough.  Marland Kitchen ibuprofen (ADVIL) 200 MG tablet Take 1 tablet (200 mg total) by mouth 3 (three) times daily. Every 8 hours for the next 3 days and then back to Q6 prn pain. (Patient not taking: Reported on 07/10/2015)  . Insulin Glargine (LANTUS SOLOSTAR) 100 UNIT/ML Solostar  Pen Inject 15 Units into the skin at bedtime.  . isosorbide mononitrate (IMDUR) 30 MG 24 hr tablet Take 1 tablet (30 mg total) by mouth daily. For chest pain  . loratadine (CLARITIN) 10 MG tablet Take 1 tablet (10 mg total) by mouth daily as needed for allergies. (Patient not taking: Reported on 07/10/2015)  . losartan (COZAAR) 50 MG tablet Take 1 tablet (50 mg total) by mouth daily.  . metFORMIN (GLUCOPHAGE) 1000 MG tablet Take 1 tablet (1,000 mg total) by mouth 2 (two) times daily with a meal. For diabetes management  . pravastatin (PRAVACHOL) 40 MG tablet Take 1 tablet (40 mg total) by mouth daily. For high cholesterol   No facility-administered encounter medications on file as of 08/10/2015.    Family History  Problem Relation Age of Onset  . Diabetes Mother   . Hypertension Mother   . Hyperlipidemia Mother   . Diabetes Father   . Cancer Father   . Hypertension Father   . Diabetes Brother   . Heart disease Brother   . Colon cancer Neg Hx   . Diabetes Sister     Social History   Social History  . Marital Status: Divorced    Spouse Name: N/A  . Number of Children: N/A  . Years of Education: N/A   Occupational History  . Not on file.   Social History Main Topics  . Smoking status: Never Smoker   . Smokeless tobacco: Never Used  . Alcohol Use: No  . Drug Use: Yes    Special: "Crack" cocaine, Cocaine     Comment: last used crack 04/2014. In rehab  . Sexual Activity: No     Comment: accepted condoms   Other Topics Concern  . Not on file   Social History Narrative   Currently staying with a friend in [place].  Was staying @ local motel until a few days ago - left b/c of bed bugs.    Review of Systems General: Denies fever, chills, fatigue.  Respiratory: Denies SOB, cough.   Cardiovascular: Denies chest pain and palpitations.  Gastrointestinal/GU: Denies nausea, vomiting, abdominal pain, diarrhea. Denies incontinence Endocrine: Denies polyuria, and  polydipsia. Musculoskeletal: Admits to chronic low back, lower extremity, neck and upper extremity pains (chronic), Denies myalgias, joint swelling.  Skin: Denies pallor, rash and wounds.  Neurological: Denies dizziness, headaches, weakness, lightheadedness, numbness     Objective:   Physical Exam Filed Vitals:   08/10/15 0831 08/10/15 0832 08/10/15 0843  BP: 143/106  120/80  Pulse: 65  74  Temp: 97.8 F (36.6 C)    TempSrc: Oral    Height:  5' 7.5" (1.715 m)   Weight:  182 lb 14.4 oz (82.963 kg)   SpO2:  100%    General: Vital signs reviewed.  Patient is  well-developed and well-nourished, in no acute distress and cooperative with exam.  Neck: Mild tenderness on palpation of C spine, normal ROM.  Cardiovascular: RRR, S1 normal, S2 normal, no murmurs, gallops, or rubs. Pulmonary/Chest: Clear to auscultation bilaterally, no wheezes, rales, or rhonchi. Abdominal: Soft, non-tender, non-distended, BS + Musculoskeletal: 4/5 strength in left upper extremity, 5/5 strength in RUE, somewhat limited by pain, normal sensation. 4/5 strength in LLE and 5/5 strength in RLE, normal sensation. Extremities: No lower extremity edema bilaterally Skin: Warm, dry and intact. No rashes or erythema. Psychiatric: Normal mood and affect. speech and behavior is normal. Cognition and memory are normal.      Assessment & Plan:   Please see problem based assessment and plan.

## 2015-08-10 NOTE — Assessment & Plan Note (Signed)
Patient admits to chronic low back pain and chronic neck pain after a work injury in 1985 and a re-injury from a [place] in 2015. He has appointment with neurosurgery at [facility] on 08/17/15 for surgery. He walks with a cane and report weakness in his upper and lower extremities (L>R), normal sensation. No urinary or bowel incontinence.   Plan: -Follow up with Neurosurgery on 08/17/15

## 2015-08-10 NOTE — Patient Instructions (Signed)
CONTINUE TAKING METFORMIN 1000 MG TWICE A DAY. CONTINUE LANTUS 15 UNITS AT NIGHT. WE MAY INCREASE THIS TO 20 UNITS AT YOUR FOLLOW UP VISIT.

## 2015-08-13 ENCOUNTER — Emergency Department (HOSPITAL_COMMUNITY)
Admission: EM | Admit: 2015-08-13 | Discharge: 2015-08-14 | Disposition: A | Discharge status: Other Acute Inpatient Hospital | Payer: Medicare Other | Attending: Emergency Medicine | Admitting: Emergency Medicine

## 2015-08-13 ENCOUNTER — Emergency Department (HOSPITAL_COMMUNITY): Payer: Medicare Other

## 2015-08-13 ENCOUNTER — Other Ambulatory Visit: Payer: Self-pay

## 2015-08-13 ENCOUNTER — Encounter (HOSPITAL_COMMUNITY): Payer: Self-pay | Admitting: Emergency Medicine

## 2015-08-13 DIAGNOSIS — R05 Cough: Secondary | ICD-10-CM | POA: Diagnosis not present

## 2015-08-13 DIAGNOSIS — I10 Essential (primary) hypertension: Secondary | ICD-10-CM | POA: Insufficient documentation

## 2015-08-13 DIAGNOSIS — Z7902 Long term (current) use of antithrombotics/antiplatelets: Secondary | ICD-10-CM | POA: Insufficient documentation

## 2015-08-13 DIAGNOSIS — Z872 Personal history of diseases of the skin and subcutaneous tissue: Secondary | ICD-10-CM | POA: Diagnosis not present

## 2015-08-13 DIAGNOSIS — F1424 Cocaine dependence with cocaine-induced mood disorder: Secondary | ICD-10-CM | POA: Diagnosis not present

## 2015-08-13 DIAGNOSIS — Z9861 Coronary angioplasty status: Secondary | ICD-10-CM | POA: Diagnosis not present

## 2015-08-13 DIAGNOSIS — E119 Type 2 diabetes mellitus without complications: Secondary | ICD-10-CM | POA: Diagnosis not present

## 2015-08-13 DIAGNOSIS — R109 Unspecified abdominal pain: Secondary | ICD-10-CM | POA: Diagnosis not present

## 2015-08-13 DIAGNOSIS — Z794 Long term (current) use of insulin: Secondary | ICD-10-CM | POA: Insufficient documentation

## 2015-08-13 DIAGNOSIS — F1420 Cocaine dependence, uncomplicated: Secondary | ICD-10-CM | POA: Diagnosis present

## 2015-08-13 DIAGNOSIS — Z7982 Long term (current) use of aspirin: Secondary | ICD-10-CM | POA: Diagnosis not present

## 2015-08-13 DIAGNOSIS — E785 Hyperlipidemia, unspecified: Secondary | ICD-10-CM | POA: Insufficient documentation

## 2015-08-13 DIAGNOSIS — F329 Major depressive disorder, single episode, unspecified: Secondary | ICD-10-CM | POA: Insufficient documentation

## 2015-08-13 DIAGNOSIS — F1414 Cocaine abuse with cocaine-induced mood disorder: Secondary | ICD-10-CM | POA: Diagnosis not present

## 2015-08-13 DIAGNOSIS — I2510 Atherosclerotic heart disease of native coronary artery without angina pectoris: Secondary | ICD-10-CM | POA: Insufficient documentation

## 2015-08-13 DIAGNOSIS — Z79899 Other long term (current) drug therapy: Secondary | ICD-10-CM | POA: Insufficient documentation

## 2015-08-13 DIAGNOSIS — B20 Human immunodeficiency virus [HIV] disease: Secondary | ICD-10-CM | POA: Insufficient documentation

## 2015-08-13 DIAGNOSIS — F32A Depression, unspecified: Secondary | ICD-10-CM

## 2015-08-13 DIAGNOSIS — M542 Cervicalgia: Secondary | ICD-10-CM | POA: Diagnosis not present

## 2015-08-13 DIAGNOSIS — R059 Cough, unspecified: Secondary | ICD-10-CM

## 2015-08-13 DIAGNOSIS — R45851 Suicidal ideations: Secondary | ICD-10-CM | POA: Diagnosis not present

## 2015-08-13 DIAGNOSIS — M109 Gout, unspecified: Secondary | ICD-10-CM | POA: Diagnosis not present

## 2015-08-13 DIAGNOSIS — F1494 Cocaine use, unspecified with cocaine-induced mood disorder: Secondary | ICD-10-CM | POA: Diagnosis present

## 2015-08-13 DIAGNOSIS — R440 Auditory hallucinations: Secondary | ICD-10-CM | POA: Diagnosis not present

## 2015-08-13 LAB — CBC WITH DIFFERENTIAL/PLATELET
BASOS ABS: 0 10*3/uL (ref 0.0–0.1)
Basophils Relative: 1 %
EOS PCT: 4 %
Eosinophils Absolute: 0.2 10*3/uL (ref 0.0–0.7)
HCT: 35.7 % — ABNORMAL LOW (ref 39.0–52.0)
Hemoglobin: 11.6 g/dL — ABNORMAL LOW (ref 13.0–17.0)
LYMPHS PCT: 27 %
Lymphs Abs: 1.1 10*3/uL (ref 0.7–4.0)
MCH: 26.3 pg (ref 26.0–34.0)
MCHC: 32.5 g/dL (ref 30.0–36.0)
MCV: 81 fL (ref 78.0–100.0)
Monocytes Absolute: 0.4 10*3/uL (ref 0.1–1.0)
Monocytes Relative: 9 %
Neutro Abs: 2.5 10*3/uL (ref 1.7–7.7)
Neutrophils Relative %: 59 %
PLATELETS: 153 10*3/uL (ref 150–400)
RBC: 4.41 MIL/uL (ref 4.22–5.81)
RDW: 13.2 % (ref 11.5–15.5)
WBC: 4.1 10*3/uL (ref 4.0–10.5)

## 2015-08-13 LAB — COMPREHENSIVE METABOLIC PANEL
ALT: 20 U/L (ref 17–63)
AST: 31 U/L (ref 15–41)
Albumin: 4.2 g/dL (ref 3.5–5.0)
Alkaline Phosphatase: 73 U/L (ref 38–126)
Anion gap: 8 (ref 5–15)
BUN: 22 mg/dL — ABNORMAL HIGH (ref 6–20)
CHLORIDE: 106 mmol/L (ref 101–111)
CO2: 26 mmol/L (ref 22–32)
CREATININE: 1.29 mg/dL — AB (ref 0.61–1.24)
Calcium: 9.2 mg/dL (ref 8.9–10.3)
GFR calc non Af Amer: 57 mL/min — ABNORMAL LOW (ref >60)
Glucose, Bld: 305 mg/dL — ABNORMAL HIGH (ref 65–99)
Potassium: 3.9 mmol/L (ref 3.5–5.1)
SODIUM: 140 mmol/L (ref 135–145)
Total Bilirubin: 1 mg/dL (ref 0.3–1.2)
Total Protein: 7.9 g/dL (ref 6.5–8.1)

## 2015-08-13 LAB — RAPID URINE DRUG SCREEN, HOSP PERFORMED
AMPHETAMINES: NOT DETECTED
Barbiturates: NOT DETECTED
Benzodiazepines: NOT DETECTED
Cocaine: POSITIVE — AB
Opiates: NOT DETECTED
Tetrahydrocannabinol: NOT DETECTED

## 2015-08-13 LAB — URINALYSIS, ROUTINE W REFLEX MICROSCOPIC
Bilirubin Urine: NEGATIVE
Ketones, ur: 15 mg/dL — AB
Leukocytes, UA: NEGATIVE
Nitrite: NEGATIVE
PROTEIN: 30 mg/dL — AB
SPECIFIC GRAVITY, URINE: 1.035 — AB (ref 1.005–1.030)
Urobilinogen, UA: 0.2 mg/dL (ref 0.0–1.0)
pH: 5.5 (ref 5.0–8.0)

## 2015-08-13 LAB — ETHANOL: Alcohol, Ethyl (B): <5 mg/dL (ref <5)

## 2015-08-13 LAB — URINE MICROSCOPIC-ADD ON

## 2015-08-13 LAB — CBG MONITORING, ED
Glucose-Capillary: 258 mg/dL — ABNORMAL HIGH (ref 65–99)
Glucose-Capillary: 178 mg/dL — ABNORMAL HIGH (ref 65–99)

## 2015-08-13 MED ORDER — DULOXETINE HCL 30 MG PO CPEP
30.0000 mg | ORAL_CAPSULE | Freq: Every day | ORAL | Status: DC
  Administered 2015-08-13 – 2015-08-14 (×2): 30 mg via ORAL
  Filled 2015-08-13 (×2): qty 1

## 2015-08-13 MED ORDER — ALUM & MAG HYDROXIDE-SIMETH 200-200-20 MG/5ML PO SUSP: 30.0000 mL | ORAL | Status: DC | PRN

## 2015-08-13 MED ORDER — INSULIN GLARGINE 100 UNIT/ML ~~LOC~~ SOLN
15.0000 [IU] | Freq: Every day | SUBCUTANEOUS | Status: DC
  Administered 2015-08-13: 15 [IU] via SUBCUTANEOUS
  Filled 2015-08-13 (×2): qty 0.15

## 2015-08-13 MED ORDER — LOSARTAN POTASSIUM 50 MG PO TABS
50.0000 mg | ORAL_TABLET | Freq: Every day | ORAL | Status: DC
  Administered 2015-08-13 – 2015-08-14 (×2): 50 mg via ORAL
  Filled 2015-08-13 (×2): qty 1

## 2015-08-13 MED ORDER — CLOPIDOGREL BISULFATE 75 MG PO TABS
75.0000 mg | ORAL_TABLET | Freq: Every day | ORAL | Status: DC
  Administered 2015-08-13 – 2015-08-14 (×2): 75 mg via ORAL
  Filled 2015-08-13 (×3): qty 1

## 2015-08-13 MED ORDER — PRAVASTATIN SODIUM 40 MG PO TABS
40.0000 mg | ORAL_TABLET | Freq: Every day | ORAL | Status: DC
  Administered 2015-08-13: 40 mg via ORAL
  Filled 2015-08-13 (×2): qty 1

## 2015-08-13 MED ORDER — ISOSORBIDE MONONITRATE ER 30 MG PO TB24
30.0000 mg | ORAL_TABLET | Freq: Every day | ORAL | Status: DC
  Administered 2015-08-13 – 2015-08-14 (×2): 30 mg via ORAL
  Filled 2015-08-13 (×2): qty 1

## 2015-08-13 MED ORDER — ALBUTEROL SULFATE HFA 108 (90 BASE) MCG/ACT IN AERS: 1.0000 | INHALATION_SPRAY | Freq: Four times a day (QID) | RESPIRATORY_TRACT | Status: DC | PRN

## 2015-08-13 MED ORDER — ASPIRIN EC 81 MG PO TBEC
81.0000 mg | DELAYED_RELEASE_TABLET | Freq: Every day | ORAL | Status: DC
  Administered 2015-08-13 – 2015-08-14 (×2): 81 mg via ORAL
  Filled 2015-08-13 (×2): qty 1

## 2015-08-13 MED ORDER — INSULIN ASPART 100 UNIT/ML ~~LOC~~ SOLN
0.0000 [IU] | Freq: Every day | SUBCUTANEOUS | Status: DC
  Filled 2015-08-13: qty 1

## 2015-08-13 MED ORDER — INSULIN ASPART 100 UNIT/ML ~~LOC~~ SOLN
0.0000 [IU] | Freq: Three times a day (TID) | SUBCUTANEOUS | Status: DC
  Administered 2015-08-13: 8 [IU] via SUBCUTANEOUS
  Administered 2015-08-14: 11 [IU] via SUBCUTANEOUS
  Filled 2015-08-13 (×2): qty 1

## 2015-08-13 MED ORDER — ONDANSETRON HCL 4 MG PO TABS: 4.0000 mg | ORAL_TABLET | Freq: Three times a day (TID) | ORAL | Status: DC | PRN

## 2015-08-13 MED ORDER — LORAZEPAM 1 MG PO TABS
1.0000 mg | ORAL_TABLET | Freq: Three times a day (TID) | ORAL | Status: DC | PRN
  Administered 2015-08-13: 1 mg via ORAL
  Filled 2015-08-13: qty 1

## 2015-08-13 MED ORDER — METFORMIN HCL 500 MG PO TABS
1000.0000 mg | ORAL_TABLET | Freq: Two times a day (BID) | ORAL | Status: DC
  Administered 2015-08-13: 1000 mg via ORAL
  Administered 2015-08-14: 500 mg via ORAL
  Filled 2015-08-13 (×4): qty 2

## 2015-08-13 MED ORDER — FAMOTIDINE 20 MG PO TABS
20.0000 mg | ORAL_TABLET | Freq: Every day | ORAL | Status: DC
  Administered 2015-08-13 – 2015-08-14 (×2): 20 mg via ORAL
  Filled 2015-08-13 (×2): qty 1

## 2015-08-13 MED ORDER — ALLOPURINOL 100 MG PO TABS
200.0000 mg | ORAL_TABLET | Freq: Every day | ORAL | Status: DC
  Administered 2015-08-13 – 2015-08-14 (×2): 200 mg via ORAL
  Filled 2015-08-13 (×2): qty 2

## 2015-08-13 MED ORDER — EMTRICITAB-RILPIVIR-TENOFOV DF 200-25-300 MG PO TABS
1.0000 | ORAL_TABLET | Freq: Every day | ORAL | Status: DC
  Administered 2015-08-13 – 2015-08-14 (×2): 1 via ORAL
  Filled 2015-08-13 (×2): qty 1
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
## 2015-08-13 NOTE — ED Notes (Signed)
Pt reported having generalized body aches and audible hallucinations telling him to do things to harm self. Pt denies attempts with plan to OD on crack cocaine. Last crack use was this morning. Pt stated that spinal pain is affecting his heart wall. Pt reported nonproductive cough and cold symptoms with improvement from cough syrup and pills. Pt was seen and treat at [facility] for cold-like symptoms x1 month ago.

## 2015-08-13 NOTE — ED Provider Notes (Signed)
CSN: [id number]     Arrival date & time 08/13/15  [id number] History   First MD Initiated Contact with Patient 08/13/15 [phone]     Chief Complaint  Patient presents with  . Suicidal  . Cough     (Consider location/radiation/quality/duration/timing/severity/associated sxs/prior Treatment) Patient is a 64 y.o. male presenting with cough. The history is provided by the patient.  Cough Associated symptoms: no chills    patient presents with suicidal thoughts. States he's been hallucinating and hearing voices. States that his been minimal over last several months. States does not have a history of schizophrenia. States the voices are telling him to do things to himself. He continues to smoke crack. Also has chronic neck and back pain which is due to get surgery on. States he's also had some coughing. His been seen for that diagnosis of bronchitis. No fevers. No chills. No abdominal pain. Pressing on his abdomen does hurt his back.  Past Medical History  Diagnosis Date  . Diabetes mellitus     Diagnosed in 2002, started insulin in 2012  . Hypertension   . Headache(784.0)     CT head 08/2011: Periventricular and subcortical white matter hypodensities are most in keeping with chronic microangiopathic change  . Gout   . Hyperlipidemia   . HIV infection [facility]) Nov 2012    Followed by [name]  . Crack cocaine use     for 20+ years, has been enrolled in detox programs in the past  . Chondromalacia of medial femoral condyle     Left knee MRI 04/28/12: Chondromalacia of the medial femoral condyle with slight peripheral degeneration of the meniscocapsular junction of the medial meniscus; followed by sports medicine  . Asthma     No PFTs, history of childhood asthma  . Depression     with history of hospitalization for suicidal ideation  . CAD (coronary artery disease)     a. 06/2013 STEMI/PCI ([facility]): LAD w/ thrombus (treated with BMS), mid 75%, D2 75%; LCX OM2 75%; RCA small, PDA 95%, PLV 95%;  b.  10/2013 Cath/PCI: ISR w/in LAD (Promus DES x 2), borderline OM2 lesion;  c. 01/2014 MV: Intermediate risk, medium-sized distal ant wall infarct w/ very small amt of peri-infarct ischemia. EF 60%.  . Gout 04/28/2012  . Collagen vascular disease ([place])   . Cellulitis 04/2014    left facial   Past Surgical History  Procedure Laterality Date  . Bowel resection    . Back surgery      1988  . Cardiac surgery    . Cervical spine surgery      " rods in my neck "  . Coronary stent placement    . Nm myocar perf wall motion  12/27/2011    normal  . Spine surgery     Family History  Problem Relation Age of Onset  . Diabetes Mother   . Hypertension Mother   . Hyperlipidemia Mother   . Diabetes Father   . Cancer Father   . Hypertension Father   . Diabetes Brother   . Heart disease Brother   . Colon cancer Neg Hx   . Diabetes Sister    Social History  Substance Use Topics  . Smoking status: Never Smoker   . Smokeless tobacco: Never Used  . Alcohol Use: No    Review of Systems  Constitutional: Negative for chills.  Respiratory: Positive for cough.   Gastrointestinal: Negative for abdominal pain.  Genitourinary: Negative for flank pain.  Musculoskeletal: Positive  for neck pain.  Neurological: Negative for light-headedness.  Psychiatric/Behavioral: Positive for suicidal ideas and hallucinations.      Allergies  Review of patient's allergies indicates no known allergies.  Home Medications   Prior to Admission medications   Medication Sig Start Date End Date Taking? Authorizing Provider  albuterol (PROVENTIL HFA;VENTOLIN HFA) 108 (90 BASE) MCG/ACT inhaler Inhale into the lungs every 6 (six) hours as needed for wheezing or shortness of breath.   Yes Historical Provider, MD  allopurinol (ZYLOPRIM) 100 MG tablet Take 2 tablets (200 mg total) by mouth daily. For gouty arthritis 07/10/15  Yes Liberty Handy, MD  aspirin EC 81 MG tablet Take 1 tablet (81 mg total) by mouth daily. For heart  health 03/03/15  Yes Norman Herrlich, MD  clopidogrel (PLAVIX) 75 MG tablet Take 1 tablet (75 mg total) by mouth daily. For prevention of stroke 07/10/15  Yes Liberty Handy, MD  DULoxetine (CYMBALTA) 30 MG capsule Take 1 capsule (30 mg total) by mouth daily. 08/07/15  Yes Norman Herrlich, MD  Emtricitab-Rilpivir-Tenofov DF (COMPLERA) 200-25-300 MG TABS Take 1 tablet by mouth daily. 07/10/15  Yes Liberty Handy, MD  famotidine (PEPCID) 20 MG tablet Take 20 mg by mouth daily. 8pm   Yes Historical Provider, MD  ibuprofen (ADVIL) 200 MG tablet Take 1 tablet (200 mg total) by mouth 3 (three) times daily. Every 8 hours for the next 3 days and then back to Q6 prn pain. Patient taking differently: Take 400 mg by mouth every 6 (six) hours as needed for moderate pain.  04/21/15  Yes Burtis Junes, NP  Insulin Glargine (LANTUS SOLOSTAR) 100 UNIT/ML Solostar Pen Inject 15 Units into the skin at bedtime. 03/06/15  Yes Langley Gauss Moding, MD  isosorbide mononitrate (IMDUR) 30 MG 24 hr tablet Take 1 tablet (30 mg total) by mouth daily. For chest pain 07/10/15  Yes Liberty Handy, MD  losartan (COZAAR) 50 MG tablet Take 1 tablet (50 mg total) by mouth daily. 07/10/15 07/09/16 Yes Liberty Handy, MD  metFORMIN (GLUCOPHAGE) 1000 MG tablet Take 1 tablet (1,000 mg total) by mouth 2 (two) times daily with a meal. For diabetes management 12/20/14  Yes Encarnacion Slates, NP  pravastatin (PRAVACHOL) 40 MG tablet Take 1 tablet (40 mg total) by mouth daily. For high cholesterol 12/20/14  Yes Encarnacion Slates, NP  HYDROcodone-homatropine (HYCODAN) 5-1.5 MG/5ML syrup Take 5 mLs by mouth every 6 (six) hours as needed for cough. Patient not taking: Reported on 08/13/2015 07/18/15   Corky Sox, MD  loratadine (CLARITIN) 10 MG tablet Take 1 tablet (10 mg total) by mouth daily as needed for allergies. Patient not taking: Reported on 07/10/2015 04/26/15 04/25/16  Zada Finders, MD   BP 140/80 mmHg  Pulse 59  Temp(Src) 97.5 F (36.4 C) (Oral)  Resp 20  SpO2  100% Physical Exam  Constitutional: He appears well-developed and well-nourished.  Eyes: EOM are normal.  Cardiovascular: Normal rate.   Pulmonary/Chest: Effort normal.  Abdominal: Soft. There is tenderness.  Back pain with palpation of the abdomen.  Musculoskeletal: Normal range of motion.  Neurological: He is alert.    ED Course  Procedures (including critical care time) Labs Review Labs Reviewed  COMPREHENSIVE METABOLIC PANEL - Abnormal; Notable for the following:    Glucose, Bld 305 (*)    BUN 22 (*)    Creatinine, Ser 1.29 (*)    GFR calc non Af Amer 57 (*)    All other components within normal  limits  CBC WITH DIFFERENTIAL/PLATELET - Abnormal; Notable for the following:    Hemoglobin 11.6 (*)    HCT 35.7 (*)    All other components within normal limits  URINE RAPID DRUG SCREEN, HOSP PERFORMED - Abnormal; Notable for the following:    Cocaine POSITIVE (*)    All other components within normal limits  URINALYSIS, ROUTINE W REFLEX MICROSCOPIC (NOT AT [facility]) - Abnormal; Notable for the following:    Specific Gravity, Urine 1.035 (*)    Glucose, UA >1000 (*)    Hgb urine dipstick SMALL (*)    Ketones, ur 15 (*)    Protein, ur 30 (*)    All other components within normal limits  ETHANOL  URINE MICROSCOPIC-ADD ON    Imaging Review Dg Chest 2 View  08/13/2015  CLINICAL DATA:  Generalized body aches and hallucinations. Cocaine abuse. Cough and cold symptoms. EXAM: CHEST  2 VIEW COMPARISON:  07/18/2015 FINDINGS: The cardiac silhouette, mediastinal and hilar contours are within normal limits and stable. There is mild tortuosity of the thoracic aorta. Coronary artery stents are noted. No acute pulmonary findings. The bony thorax is intact. Stable degenerative changes involving the thoracic spine. IMPRESSION: No acute cardiopulmonary findings. Electronically Signed   By: Marijo Sanes M.D.   On: 08/13/2015 10:48   Ct Head Wo Contrast  08/13/2015  CLINICAL DATA:  Diffuse myalgias.  Audible hallucinations. Used crack this morning. EXAM: CT HEAD WITHOUT CONTRAST TECHNIQUE: Contiguous axial images were obtained from the base of the skull through the vertex without intravenous contrast. COMPARISON:  Previous examinations, including the brain MR dated 12/15/2014 and head CT dated 04/25/2014. FINDINGS: The previously noted 7 mm lipoma associated with the inferior aspect of the septum pellucidum near the foramen of Monro is unchanged. Diffusely enlarged ventricles and subarachnoid spaces. Patchy white matter low density in both cerebral hemispheres. No intracranial hemorrhage, mass lesion or CT evidence of acute infarction. Unremarkable bones and included paranasal sinuses. IMPRESSION: 1. No acute abnormality. 2. Stable atrophy and chronic small vessel white matter ischemic changes. Electronically Signed   By: Claudie Revering M.D.   On: 08/13/2015 11:26   I have personally reviewed and evaluated these images and lab results as part of my medical decision-making.   EKG Interpretation   Date/Time:  Sunday August 13 2015 09:07:46 EST Ventricular Rate:  62 PR Interval:  142 QRS Duration: 78 QT Interval:  396 QTC Calculation: 402 R Axis:   62 Text Interpretation:  Sinus rhythm Atrial premature complexes Abnormal  R-wave progression, early transition Confirmed by Davarion Cuffee  MD, Leani Myron  [phone]) on 08/13/2015 10:17:04 AM      MDM   Final diagnoses:  Cough  Depression  Suicidal ideations    Patient presents with cough hallucinations and depression. Some's abuse or crack cocaine. States the hallucinations are telling him to hurt himself. He appears to medically cleared at this time and will be seen by TTS.    Davonna Belling, MD 08/13/15 [phone]

## 2015-08-13 NOTE — ED Notes (Signed)
Bed: WA27 Expected date:  Expected time:  Means of arrival:  Comments: 

## 2015-08-14 ENCOUNTER — Observation Stay (HOSPITAL_COMMUNITY)
Admission: AD | Admit: 2015-08-14 | Discharge: 2015-08-15 | Disposition: A | Discharge status: Home / Self Care | Payer: Medicare Other | Source: Intra-hospital | Attending: Psychiatry | Admitting: Psychiatry

## 2015-08-14 ENCOUNTER — Encounter (HOSPITAL_COMMUNITY): Payer: Self-pay | Admitting: *Deleted

## 2015-08-14 DIAGNOSIS — M94262 Chondromalacia, left knee: Secondary | ICD-10-CM | POA: Insufficient documentation

## 2015-08-14 DIAGNOSIS — Z21 Asymptomatic human immunodeficiency virus [HIV] infection status: Secondary | ICD-10-CM | POA: Diagnosis not present

## 2015-08-14 DIAGNOSIS — M109 Gout, unspecified: Secondary | ICD-10-CM | POA: Diagnosis not present

## 2015-08-14 DIAGNOSIS — E785 Hyperlipidemia, unspecified: Secondary | ICD-10-CM | POA: Insufficient documentation

## 2015-08-14 DIAGNOSIS — M797 Fibromyalgia: Secondary | ICD-10-CM | POA: Insufficient documentation

## 2015-08-14 DIAGNOSIS — F1420 Cocaine dependence, uncomplicated: Secondary | ICD-10-CM | POA: Diagnosis present

## 2015-08-14 DIAGNOSIS — Z7984 Long term (current) use of oral hypoglycemic drugs: Secondary | ICD-10-CM | POA: Insufficient documentation

## 2015-08-14 DIAGNOSIS — Z794 Long term (current) use of insulin: Secondary | ICD-10-CM | POA: Insufficient documentation

## 2015-08-14 DIAGNOSIS — F1414 Cocaine abuse with cocaine-induced mood disorder: Secondary | ICD-10-CM | POA: Diagnosis present

## 2015-08-14 DIAGNOSIS — Z7902 Long term (current) use of antithrombotics/antiplatelets: Secondary | ICD-10-CM | POA: Insufficient documentation

## 2015-08-14 DIAGNOSIS — F1424 Cocaine dependence with cocaine-induced mood disorder: Secondary | ICD-10-CM

## 2015-08-14 DIAGNOSIS — J45909 Unspecified asthma, uncomplicated: Secondary | ICD-10-CM | POA: Insufficient documentation

## 2015-08-14 DIAGNOSIS — M5416 Radiculopathy, lumbar region: Secondary | ICD-10-CM | POA: Diagnosis not present

## 2015-08-14 DIAGNOSIS — I10 Essential (primary) hypertension: Secondary | ICD-10-CM

## 2015-08-14 DIAGNOSIS — N183 Chronic kidney disease, stage 3 (moderate): Secondary | ICD-10-CM | POA: Diagnosis not present

## 2015-08-14 DIAGNOSIS — B20 Human immunodeficiency virus [HIV] disease: Secondary | ICD-10-CM

## 2015-08-14 DIAGNOSIS — I998 Other disorder of circulatory system: Secondary | ICD-10-CM | POA: Insufficient documentation

## 2015-08-14 DIAGNOSIS — I2510 Atherosclerotic heart disease of native coronary artery without angina pectoris: Secondary | ICD-10-CM | POA: Diagnosis not present

## 2015-08-14 DIAGNOSIS — E1140 Type 2 diabetes mellitus with diabetic neuropathy, unspecified: Secondary | ICD-10-CM | POA: Insufficient documentation

## 2015-08-14 DIAGNOSIS — I129 Hypertensive chronic kidney disease with stage 1 through stage 4 chronic kidney disease, or unspecified chronic kidney disease: Secondary | ICD-10-CM | POA: Diagnosis not present

## 2015-08-14 DIAGNOSIS — E1122 Type 2 diabetes mellitus with diabetic chronic kidney disease: Secondary | ICD-10-CM | POA: Diagnosis not present

## 2015-08-14 DIAGNOSIS — F1423 Cocaine dependence with withdrawal: Secondary | ICD-10-CM | POA: Diagnosis not present

## 2015-08-14 DIAGNOSIS — Z7982 Long term (current) use of aspirin: Secondary | ICD-10-CM | POA: Insufficient documentation

## 2015-08-14 DIAGNOSIS — R05 Cough: Secondary | ICD-10-CM | POA: Insufficient documentation

## 2015-08-14 DIAGNOSIS — G8929 Other chronic pain: Secondary | ICD-10-CM | POA: Insufficient documentation

## 2015-08-14 DIAGNOSIS — R45851 Suicidal ideations: Secondary | ICD-10-CM

## 2015-08-14 DIAGNOSIS — F1494 Cocaine use, unspecified with cocaine-induced mood disorder: Secondary | ICD-10-CM | POA: Diagnosis present

## 2015-08-14 DIAGNOSIS — F329 Major depressive disorder, single episode, unspecified: Secondary | ICD-10-CM | POA: Diagnosis not present

## 2015-08-14 LAB — CBG MONITORING, ED
Glucose-Capillary: 326 mg/dL — ABNORMAL HIGH (ref 65–99)
Glucose-Capillary: 69 mg/dL (ref 65–99)

## 2015-08-14 LAB — GLUCOSE, CAPILLARY
Glucose-Capillary: 140 mg/dL — ABNORMAL HIGH (ref 65–99)
Glucose-Capillary: 122 mg/dL — ABNORMAL HIGH (ref 65–99)

## 2015-08-14 MED ORDER — ASPIRIN EC 81 MG PO TBEC
81.0000 mg | DELAYED_RELEASE_TABLET | Freq: Every day | ORAL | Status: DC
  Administered 2015-08-15: 81 mg via ORAL
  Filled 2015-08-14: qty 1

## 2015-08-14 MED ORDER — PRAVASTATIN SODIUM 40 MG PO TABS
40.0000 mg | ORAL_TABLET | Freq: Every day | ORAL | Status: DC
  Administered 2015-08-14: 40 mg via ORAL
  Filled 2015-08-14 (×5): qty 1

## 2015-08-14 MED ORDER — CLOPIDOGREL BISULFATE 75 MG PO TABS
75.0000 mg | ORAL_TABLET | Freq: Every day | ORAL | Status: DC
  Administered 2015-08-15: 75 mg via ORAL
  Filled 2015-08-14: qty 1

## 2015-08-14 MED ORDER — INSULIN ASPART 100 UNIT/ML ~~LOC~~ SOLN: 0.0000 [IU] | Freq: Every day | SUBCUTANEOUS | Status: DC

## 2015-08-14 MED ORDER — INSULIN GLARGINE 100 UNIT/ML ~~LOC~~ SOLN
15.0000 [IU] | Freq: Every day | SUBCUTANEOUS | Status: DC
  Administered 2015-08-14: 15 [IU] via SUBCUTANEOUS

## 2015-08-14 MED ORDER — FAMOTIDINE 20 MG PO TABS
20.0000 mg | ORAL_TABLET | Freq: Every day | ORAL | Status: DC
  Administered 2015-08-15: 20 mg via ORAL
  Filled 2015-08-14: qty 1

## 2015-08-14 MED ORDER — ONDANSETRON HCL 4 MG PO TABS: 4.0000 mg | ORAL_TABLET | Freq: Three times a day (TID) | ORAL | Status: DC | PRN

## 2015-08-14 MED ORDER — DULOXETINE HCL 30 MG PO CPEP
30.0000 mg | ORAL_CAPSULE | Freq: Every day | ORAL | Status: DC
  Administered 2015-08-15: 30 mg via ORAL
  Filled 2015-08-14: qty 1

## 2015-08-14 MED ORDER — ALBUTEROL SULFATE HFA 108 (90 BASE) MCG/ACT IN AERS: 1.0000 | INHALATION_SPRAY | Freq: Four times a day (QID) | RESPIRATORY_TRACT | Status: DC | PRN

## 2015-08-14 MED ORDER — ALUM & MAG HYDROXIDE-SIMETH 200-200-20 MG/5ML PO SUSP: 30.0000 mL | ORAL | Status: DC | PRN

## 2015-08-14 MED ORDER — ALLOPURINOL 100 MG PO TABS
200.0000 mg | ORAL_TABLET | Freq: Every day | ORAL | Status: DC
  Administered 2015-08-15: 200 mg via ORAL
  Filled 2015-08-14 (×3): qty 2

## 2015-08-14 MED ORDER — EMTRICITAB-RILPIVIR-TENOFOV DF 200-25-300 MG PO TABS
1.0000 | ORAL_TABLET | Freq: Every day | ORAL | Status: DC
Start: 2015-08-15 — End: 2015-08-15
  Administered 2015-08-15: 1 via ORAL
  Filled 2015-08-14 (×3): qty 1

## 2015-08-14 MED ORDER — LORAZEPAM 1 MG PO TABS: 1.0000 mg | ORAL_TABLET | Freq: Three times a day (TID) | ORAL | Status: DC | PRN

## 2015-08-14 MED ORDER — METFORMIN HCL 500 MG PO TABS
1000.0000 mg | ORAL_TABLET | Freq: Two times a day (BID) | ORAL | Status: DC
  Administered 2015-08-14 – 2015-08-15 (×2): 1000 mg via ORAL
  Filled 2015-08-14 (×2): qty 2

## 2015-08-14 MED ORDER — LOSARTAN POTASSIUM 25 MG PO TABS
50.0000 mg | ORAL_TABLET | Freq: Every day | ORAL | Status: DC
  Administered 2015-08-15: 50 mg via ORAL
  Filled 2015-08-14: qty 2

## 2015-08-14 MED ORDER — TRAZODONE HCL 50 MG PO TABS
50.0000 mg | ORAL_TABLET | Freq: Every evening | ORAL | Status: DC | PRN
  Administered 2015-08-14: 50 mg via ORAL
  Filled 2015-08-14: qty 1

## 2015-08-14 MED ORDER — ACETAMINOPHEN 325 MG PO TABS
650.0000 mg | ORAL_TABLET | Freq: Four times a day (QID) | ORAL | Status: DC | PRN
  Administered 2015-08-15: 650 mg via ORAL

## 2015-08-14 MED ORDER — INSULIN ASPART 100 UNIT/ML ~~LOC~~ SOLN
0.0000 [IU] | Freq: Three times a day (TID) | SUBCUTANEOUS | Status: DC
  Administered 2015-08-14: 2 [IU] via SUBCUTANEOUS

## 2015-08-14 MED ORDER — MAGNESIUM HYDROXIDE 400 MG/5ML PO SUSP: 30.0000 mL | Freq: Every day | ORAL | Status: DC | PRN

## 2015-08-14 MED ORDER — ISOSORBIDE MONONITRATE ER 30 MG PO TB24
30.0000 mg | ORAL_TABLET | Freq: Every day | ORAL | Status: DC
  Administered 2015-08-15: 30 mg via ORAL
  Filled 2015-08-14 (×3): qty 1

## 2015-08-14 NOTE — BHH Counselor (Signed)
[facility] Assessment Progress Note  Counselor spoke to pt about discharge planning. Pt was not completely clear on what he wanted to do upon d/c. Pt reports that he still has a bed waiting for him at the [facility] and has just been contacted about being up for getting housing that he had been on the waiting list for. Pt was not clear of the agency that contacted him, but disclosed that he had been working with the counselor at [name] to get housing.   Pt shared that he completed a 45 day program with [facility] 5 months ago and has stayed clean until yesterday when he used crack to deal with his chronic pain. Pt will be getting surgery in a couple of weeks to help mitigate his pain.   Pt would benefit from completing a relapse prevention plan with AM counselor tomorrow after getting some sleep to clear his head some.   Kenna Gilbert. Lovena Le, [place], [place], [facility]CA Counselor
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
## 2015-08-14 NOTE — BH Assessment (Signed)
[name] Assessment Progress Note  Per Corena Pilgrim, MD, this pt would benefit from admission to the Observation Unit at [facility] at this time.  [name], RN, [facility] has assigned pt to Obs 7.  Pt has signed Voluntary Admission and Consent for Treatment, as well as Consent to Release Information to his sone, [name], and signed forms have been faxed to [facility].  Pt's nurse has been notified, and agrees to send original paperwork along with pt via [facility] Pries, and to call report to [phone] or [phone].  Jalene Mullet, [place] Triage Specialist [phone]
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
## 2015-08-14 NOTE — ED Notes (Signed)
Pt eating meal at this time
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
## 2015-08-14 NOTE — Progress Notes (Signed)
[name] INPATIENT:  Family/Significant Other Suicide Prevention Education  Suicide Prevention Education:  Patient Refusal for Family/Significant Other Suicide Prevention Education: The patient Michael [name] has refused to provide written consent for family/significant other to be provided Family/Significant Other Suicide Prevention Education during admission and/or prior to discharge.  Physician notified.  Davina Poke 08/14/2015, 3:37 PM

## 2015-08-14 NOTE — ED Notes (Signed)
Bed: WA31 Expected date:  Expected time:  Means of arrival:  Comments: 

## 2015-08-15 DIAGNOSIS — F329 Major depressive disorder, single episode, unspecified: Secondary | ICD-10-CM | POA: Diagnosis not present

## 2015-08-15 DIAGNOSIS — E785 Hyperlipidemia, unspecified: Secondary | ICD-10-CM | POA: Diagnosis not present

## 2015-08-15 DIAGNOSIS — N183 Chronic kidney disease, stage 3 (moderate): Secondary | ICD-10-CM | POA: Diagnosis not present

## 2015-08-15 DIAGNOSIS — Z21 Asymptomatic human immunodeficiency virus [HIV] infection status: Secondary | ICD-10-CM | POA: Diagnosis not present

## 2015-08-15 DIAGNOSIS — I2510 Atherosclerotic heart disease of native coronary artery without angina pectoris: Secondary | ICD-10-CM | POA: Diagnosis not present

## 2015-08-15 DIAGNOSIS — R45851 Suicidal ideations: Secondary | ICD-10-CM | POA: Diagnosis not present

## 2015-08-15 DIAGNOSIS — Z7982 Long term (current) use of aspirin: Secondary | ICD-10-CM | POA: Diagnosis not present

## 2015-08-15 DIAGNOSIS — F1424 Cocaine dependence with cocaine-induced mood disorder: Secondary | ICD-10-CM | POA: Diagnosis not present

## 2015-08-15 DIAGNOSIS — I129 Hypertensive chronic kidney disease with stage 1 through stage 4 chronic kidney disease, or unspecified chronic kidney disease: Secondary | ICD-10-CM | POA: Diagnosis not present

## 2015-08-15 DIAGNOSIS — Z7984 Long term (current) use of oral hypoglycemic drugs: Secondary | ICD-10-CM | POA: Diagnosis not present

## 2015-08-15 DIAGNOSIS — M797 Fibromyalgia: Secondary | ICD-10-CM | POA: Diagnosis not present

## 2015-08-15 DIAGNOSIS — E1140 Type 2 diabetes mellitus with diabetic neuropathy, unspecified: Secondary | ICD-10-CM | POA: Diagnosis not present

## 2015-08-15 DIAGNOSIS — F1420 Cocaine dependence, uncomplicated: Secondary | ICD-10-CM | POA: Insufficient documentation

## 2015-08-15 DIAGNOSIS — Z7902 Long term (current) use of antithrombotics/antiplatelets: Secondary | ICD-10-CM | POA: Diagnosis not present

## 2015-08-15 DIAGNOSIS — I998 Other disorder of circulatory system: Secondary | ICD-10-CM | POA: Diagnosis not present

## 2015-08-15 DIAGNOSIS — M109 Gout, unspecified: Secondary | ICD-10-CM | POA: Diagnosis not present

## 2015-08-15 DIAGNOSIS — R05 Cough: Secondary | ICD-10-CM | POA: Diagnosis not present

## 2015-08-15 DIAGNOSIS — M5416 Radiculopathy, lumbar region: Secondary | ICD-10-CM | POA: Diagnosis not present

## 2015-08-15 DIAGNOSIS — Z794 Long term (current) use of insulin: Secondary | ICD-10-CM | POA: Diagnosis not present

## 2015-08-15 DIAGNOSIS — M94262 Chondromalacia, left knee: Secondary | ICD-10-CM | POA: Diagnosis not present

## 2015-08-15 DIAGNOSIS — G8929 Other chronic pain: Secondary | ICD-10-CM | POA: Diagnosis not present

## 2015-08-15 DIAGNOSIS — J45909 Unspecified asthma, uncomplicated: Secondary | ICD-10-CM | POA: Diagnosis not present

## 2015-08-15 DIAGNOSIS — F1423 Cocaine dependence with withdrawal: Secondary | ICD-10-CM | POA: Diagnosis not present

## 2015-08-15 DIAGNOSIS — E1122 Type 2 diabetes mellitus with diabetic chronic kidney disease: Secondary | ICD-10-CM | POA: Diagnosis not present

## 2015-08-15 LAB — GLUCOSE, CAPILLARY
GLUCOSE-CAPILLARY: 90 mg/dL (ref 65–99)
Glucose-Capillary: 103 mg/dL — ABNORMAL HIGH (ref 65–99)

## 2015-08-15 MED ORDER — DULOXETINE HCL 30 MG PO CPEP: 30.0000 mg | ORAL_CAPSULE | Freq: Every day | ORAL | Status: DC

## 2015-08-15 MED ORDER — FAMOTIDINE 20 MG PO TABS: 20.0000 mg | ORAL_TABLET | Freq: Every day | ORAL | Status: DC

## 2015-08-15 MED ORDER — LOSARTAN POTASSIUM 50 MG PO TABS: 50.0000 mg | ORAL_TABLET | Freq: Every day | ORAL | Status: DC

## 2015-08-15 MED ORDER — INSULIN GLARGINE 100 UNIT/ML SOLOSTAR PEN: 15.0000 [IU] | PEN_INJECTOR | Freq: Every day | SUBCUTANEOUS | Status: DC

## 2015-08-15 MED ORDER — EMTRICITAB-RILPIVIR-TENOFOV DF 200-25-300 MG PO TABS: 1.0000 | ORAL_TABLET | Freq: Every day | ORAL | Status: DC

## 2015-08-15 NOTE — Progress Notes (Signed)
D: Pt d/c home as per MD's orders. Taxi voucher given to pt. Pt denies SI, HI and AVH when assessed. Reported generalized pain 7/10 earlier this shift. Vitals WNL and physical distress noted at this time.  A: Scheduled and PRN medication (PRN Tylenol 650 mg PO for pain) administered as prescribed. D/C instructions reviewed with pt including outside appointment or Follow up care. All belongings in locker 16 returned to pt at time of d/c.Continued support, availability and encouragement provided to pt throughout his stay in the observation unit. Q 15 minutes checks maintained for safety as ordered till time of D/C. R: Pt cooperative with care. Denies adverse drug reactions when assessed. Verbalized understanding of d/c instructions. Pt denied pain prior to d/c.Pt signed belonging sheet in agreement with items received from locker 16 prior to departure from facility. Remained safe in observation unit till time of d/c.

## 2015-08-15 NOTE — Discharge Instructions (Signed)
Patient was discharged this date and linked to the [facility] where he is currently residing. Patient gave consent for this writer to speak with [facility] who was informed that patient was given a relapse prevention plan and will follow up with [facility] along with a interview with [facility] to explore treatment programs.

## 2015-08-15 NOTE — Progress Notes (Signed)
Patient ID: Michael [name], male   DOB: 1951-04-23, 64 y.o.   MRN: [id number]  In bed, appears to be sleeping well, no distress noted. Woke up to use bathroom and back to sleep without any problems. took HS meds as ordered. Passive si/denies HI and pain, contracts for safety. 15 min checks in place. Safety maintained

## 2015-08-15 NOTE — BH Assessment (Signed)
[facility] Assessment Progress Note  Patient was discharged this date and linked to the [facility] where he is currently residing. Patient gave consent for this writer to speak with [facility] who was informed that patient was given a relapse prevention plan and will follow up with [facility] along with a interview with [facility] to explore treatment programs.
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
## 2015-08-16 ENCOUNTER — Telehealth: Payer: Self-pay | Admitting: Licensed Clinical Social Worker

## 2015-08-16 ENCOUNTER — Telehealth: Payer: Self-pay | Admitting: *Deleted

## 2015-08-16 NOTE — Telephone Encounter (Signed)
Pt stopped by clinic this AM - needs a letter for [facility] for bedrest till surgery at [facility] 08/28/15. This allows pt to stay all day at [place] that everyone is out 10:30AM Please fax letter to Legrand Como at [facility]  [phone]. Dr Hulen Luster aware - will do for pt. Hilda Blades Lenorris Karger RN 08/16/15 2PM

## 2015-08-16 NOTE — Telephone Encounter (Signed)
Michael [name] placed several calls to CSW requesting letter to provide to [facility] to allow him to have bed rest at the shelter due to his back pain.  Pt states they are currently allowing him to rest during the day, but are in need of letter from [facility].  Pt presented as walk-in requesting to see CSW.  CSW informed front office, CSW unable to provide this letter.  Letter will need to be provided by a physician.

## 2015-08-17 ENCOUNTER — Telehealth: Payer: Self-pay | Admitting: Internal Medicine

## 2015-08-17 DIAGNOSIS — Z5900 Homelessness unspecified: Secondary | ICD-10-CM

## 2015-08-17 LAB — GLUCOSE, POCT (MANUAL RESULT ENTRY): POC Glucose: 161 mg/dl — AB (ref 70–99)

## 2015-08-17 NOTE — Telephone Encounter (Signed)
Pt states [facility] have not received letter from our office regarding bedrest. Please call pt back.

## 2015-08-17 NOTE — Telephone Encounter (Signed)
Letter faxed to Michael [name] at [facility].

## 2015-08-17 NOTE — Congregational Nurse Program (Signed)
Congregational Nurse Program Note  Date of Encounter: 08/17/2015  Past Medical History: Past Medical History  Diagnosis Date  . Diabetes mellitus     Diagnosed in 2002, started insulin in 2012  . Hypertension   . Headache(784.0)     CT head 08/2011: Periventricular and subcortical white matter hypodensities are most in keeping with chronic microangiopathic change  . Gout   . Hyperlipidemia   . HIV infection [facility]) Nov 2012    Followed by [name]  . Crack cocaine use     for 20+ years, has been enrolled in detox programs in the past  . Chondromalacia of medial femoral condyle     Left knee MRI 04/28/12: Chondromalacia of the medial femoral condyle with slight peripheral degeneration of the meniscocapsular junction of the medial meniscus; followed by sports medicine  . Asthma     No PFTs, history of childhood asthma  . Depression     with history of hospitalization for suicidal ideation  . CAD (coronary artery disease)     a. 06/2013 STEMI/PCI ([facility]): LAD w/ thrombus (treated with BMS), mid 75%, D2 75%; LCX OM2 75%; RCA small, PDA 95%, PLV 95%;  b. 10/2013 Cath/PCI: ISR w/in LAD (Promus DES x 2), borderline OM2 lesion;  c. 01/2014 MV: Intermediate risk, medium-sized distal ant wall infarct w/ very small amt of peri-infarct ischemia. EF 60%.  . Gout 04/28/2012  . Collagen vascular disease ([place])   . Cellulitis 04/2014    left facial    Encounter Details:     CNP Questionnaire - 08/17/15 1815    Patient Demographics   Is this a new or existing patient? Existing   Patient is considered a/an Not Applicable   Patient Assistance   Patient's financial/insurance status Medicaid;Low Income   Patient referred to apply for the following financial assistance Not Applicable   Food insecurities addressed Provided food supplies   Transportation assistance No   Assistance securing medications No   Educational health offerings Not Applicable   Encounter Details   Primary purpose of visit  Chronic Illness/Condition Visit   Was an Emergency Department visit averted? Not Applicable   Does patient have a medical provider? Yes   Patient referred to Not Applicable   Was a mental health screening completed? (GAINS tool) No   Does patient have dental issues? No   Since previous encounter, have you referred patient for abnormal blood pressure that resulted in a new diagnosis or medication change? No   Since previous encounter, have you referred patient for abnormal blood glucose that resulted in a new diagnosis or medication change? No         Amb Nursing Assessment - 08/10/15 0833    Nutrition Screen   BMI - recorded 28.22   Nutritional Risks None   Diabetes Yes   CBG done? Yes   CBG resulted in Enter/ Edit results? Yes   Did pt. bring in CBG monitor from home? No   Glucose Meter Downloaded? No     Clinic visit requesting B/P check and CBG.

## 2015-08-17 NOTE — Telephone Encounter (Signed)
Letter faxed to Legrand Como.  Spoke with patient to inform they should have in hand this morning.  He will call back if needed.

## 2015-08-17 NOTE — Telephone Encounter (Signed)
Letter faxed to [facility] to Legrand Como.

## 2015-08-24 DIAGNOSIS — Z5900 Homelessness unspecified: Secondary | ICD-10-CM

## 2015-08-28 DIAGNOSIS — Z794 Long term (current) use of insulin: Secondary | ICD-10-CM | POA: Diagnosis not present

## 2015-08-28 DIAGNOSIS — G629 Polyneuropathy, unspecified: Secondary | ICD-10-CM | POA: Diagnosis not present

## 2015-08-28 DIAGNOSIS — E119 Type 2 diabetes mellitus without complications: Secondary | ICD-10-CM | POA: Diagnosis not present

## 2015-08-28 DIAGNOSIS — I4891 Unspecified atrial fibrillation: Secondary | ICD-10-CM | POA: Diagnosis not present

## 2015-08-28 DIAGNOSIS — G8929 Other chronic pain: Secondary | ICD-10-CM | POA: Diagnosis not present

## 2015-08-28 DIAGNOSIS — I420 Dilated cardiomyopathy: Secondary | ICD-10-CM | POA: Diagnosis not present

## 2015-08-28 DIAGNOSIS — Z79899 Other long term (current) drug therapy: Secondary | ICD-10-CM | POA: Diagnosis not present

## 2015-08-28 DIAGNOSIS — Z981 Arthrodesis status: Secondary | ICD-10-CM | POA: Insufficient documentation

## 2015-08-28 DIAGNOSIS — M5442 Lumbago with sciatica, left side: Secondary | ICD-10-CM | POA: Diagnosis not present

## 2015-08-28 DIAGNOSIS — M542 Cervicalgia: Secondary | ICD-10-CM | POA: Diagnosis not present

## 2015-08-28 DIAGNOSIS — Z9889 Other specified postprocedural states: Secondary | ICD-10-CM | POA: Diagnosis not present

## 2015-08-28 DIAGNOSIS — J45909 Unspecified asthma, uncomplicated: Secondary | ICD-10-CM | POA: Diagnosis not present

## 2015-08-28 DIAGNOSIS — Z7902 Long term (current) use of antithrombotics/antiplatelets: Secondary | ICD-10-CM | POA: Diagnosis not present

## 2015-08-28 DIAGNOSIS — M5416 Radiculopathy, lumbar region: Secondary | ICD-10-CM | POA: Diagnosis not present

## 2015-08-28 DIAGNOSIS — J449 Chronic obstructive pulmonary disease, unspecified: Secondary | ICD-10-CM | POA: Diagnosis not present

## 2015-08-28 DIAGNOSIS — M79602 Pain in left arm: Secondary | ICD-10-CM | POA: Diagnosis not present

## 2015-08-28 DIAGNOSIS — Z7982 Long term (current) use of aspirin: Secondary | ICD-10-CM | POA: Diagnosis not present

## 2015-08-28 DIAGNOSIS — I10 Essential (primary) hypertension: Secondary | ICD-10-CM | POA: Diagnosis not present

## 2015-08-28 DIAGNOSIS — I2510 Atherosclerotic heart disease of native coronary artery without angina pectoris: Secondary | ICD-10-CM | POA: Diagnosis not present

## 2015-08-29 ENCOUNTER — Telehealth: Payer: Self-pay | Admitting: Internal Medicine

## 2015-08-29 DIAGNOSIS — E162 Hypoglycemia, unspecified: Secondary | ICD-10-CM

## 2015-08-29 NOTE — Telephone Encounter (Signed)
Tried again - recording states [place] and recording is off.

## 2015-08-29 NOTE — Telephone Encounter (Signed)
Called [phone] - lady answered phone - pt does not live there. She gave me a number to call [phone] - a recording states person is not available.

## 2015-08-29 NOTE — Telephone Encounter (Signed)
Tried to call pt x 2 at phone number listed - recording gets cut off.

## 2015-08-29 NOTE — Telephone Encounter (Signed)
Pt requesting the nurse to call back. 

## 2015-08-30 ENCOUNTER — Other Ambulatory Visit: Payer: Self-pay | Admitting: Internal Medicine

## 2015-08-30 ENCOUNTER — Other Ambulatory Visit: Payer: Self-pay | Admitting: Cardiovascular Disease

## 2015-08-30 NOTE — Telephone Encounter (Signed)
Tried to call [phone] - recording states person is unavailable. Unable to leave message.

## 2015-09-02 ENCOUNTER — Encounter (HOSPITAL_COMMUNITY): Payer: Self-pay | Admitting: Emergency Medicine

## 2015-09-02 ENCOUNTER — Emergency Department (HOSPITAL_COMMUNITY)
Admission: EM | Admit: 2015-09-02 | Discharge: 2015-09-02 | Disposition: A | Discharge status: Home / Self Care | Payer: Medicare Other | Attending: Emergency Medicine | Admitting: Emergency Medicine

## 2015-09-02 ENCOUNTER — Emergency Department (HOSPITAL_COMMUNITY): Payer: Medicare Other

## 2015-09-02 DIAGNOSIS — F329 Major depressive disorder, single episode, unspecified: Secondary | ICD-10-CM | POA: Insufficient documentation

## 2015-09-02 DIAGNOSIS — Z21 Asymptomatic human immunodeficiency virus [HIV] infection status: Secondary | ICD-10-CM | POA: Insufficient documentation

## 2015-09-02 DIAGNOSIS — R0789 Other chest pain: Secondary | ICD-10-CM | POA: Insufficient documentation

## 2015-09-02 DIAGNOSIS — Z9889 Other specified postprocedural states: Secondary | ICD-10-CM | POA: Insufficient documentation

## 2015-09-02 DIAGNOSIS — I129 Hypertensive chronic kidney disease with stage 1 through stage 4 chronic kidney disease, or unspecified chronic kidney disease: Secondary | ICD-10-CM | POA: Insufficient documentation

## 2015-09-02 DIAGNOSIS — E119 Type 2 diabetes mellitus without complications: Secondary | ICD-10-CM | POA: Insufficient documentation

## 2015-09-02 DIAGNOSIS — J4521 Mild intermittent asthma with (acute) exacerbation: Secondary | ICD-10-CM | POA: Diagnosis not present

## 2015-09-02 DIAGNOSIS — Z794 Long term (current) use of insulin: Secondary | ICD-10-CM | POA: Insufficient documentation

## 2015-09-02 DIAGNOSIS — J4520 Mild intermittent asthma, uncomplicated: Secondary | ICD-10-CM | POA: Diagnosis not present

## 2015-09-02 DIAGNOSIS — E785 Hyperlipidemia, unspecified: Secondary | ICD-10-CM | POA: Diagnosis not present

## 2015-09-02 DIAGNOSIS — M109 Gout, unspecified: Secondary | ICD-10-CM | POA: Diagnosis not present

## 2015-09-02 DIAGNOSIS — I2510 Atherosclerotic heart disease of native coronary artery without angina pectoris: Secondary | ICD-10-CM | POA: Insufficient documentation

## 2015-09-02 DIAGNOSIS — R0602 Shortness of breath: Secondary | ICD-10-CM | POA: Diagnosis present

## 2015-09-02 DIAGNOSIS — Z7902 Long term (current) use of antithrombotics/antiplatelets: Secondary | ICD-10-CM | POA: Insufficient documentation

## 2015-09-02 DIAGNOSIS — N189 Chronic kidney disease, unspecified: Secondary | ICD-10-CM | POA: Diagnosis not present

## 2015-09-02 DIAGNOSIS — Z872 Personal history of diseases of the skin and subcutaneous tissue: Secondary | ICD-10-CM | POA: Insufficient documentation

## 2015-09-02 DIAGNOSIS — M549 Dorsalgia, unspecified: Secondary | ICD-10-CM | POA: Insufficient documentation

## 2015-09-02 DIAGNOSIS — Z7982 Long term (current) use of aspirin: Secondary | ICD-10-CM | POA: Insufficient documentation

## 2015-09-02 DIAGNOSIS — Z79899 Other long term (current) drug therapy: Secondary | ICD-10-CM | POA: Diagnosis not present

## 2015-09-02 DIAGNOSIS — R05 Cough: Secondary | ICD-10-CM

## 2015-09-02 DIAGNOSIS — G8929 Other chronic pain: Secondary | ICD-10-CM

## 2015-09-02 DIAGNOSIS — J45901 Unspecified asthma with (acute) exacerbation: Secondary | ICD-10-CM | POA: Insufficient documentation

## 2015-09-02 DIAGNOSIS — R059 Cough, unspecified: Secondary | ICD-10-CM

## 2015-09-02 DIAGNOSIS — Z9861 Coronary angioplasty status: Secondary | ICD-10-CM | POA: Insufficient documentation

## 2015-09-02 LAB — CBC WITH DIFFERENTIAL/PLATELET
BASOS ABS: 0 10*3/uL (ref 0.0–0.1)
Basophils Relative: 0 %
EOS PCT: 1 %
Eosinophils Absolute: 0 10*3/uL (ref 0.0–0.7)
HEMATOCRIT: 33.9 % — AB (ref 39.0–52.0)
Hemoglobin: 10.8 g/dL — ABNORMAL LOW (ref 13.0–17.0)
LYMPHS ABS: 1.6 10*3/uL (ref 0.7–4.0)
LYMPHS PCT: 29 %
MCH: 25.4 pg — AB (ref 26.0–34.0)
MCHC: 31.9 g/dL (ref 30.0–36.0)
MCV: 79.8 fL (ref 78.0–100.0)
MONO ABS: 0.3 10*3/uL (ref 0.1–1.0)
Monocytes Relative: 5 %
NEUTROS ABS: 3.5 10*3/uL (ref 1.7–7.7)
Neutrophils Relative %: 65 %
PLATELETS: 155 10*3/uL (ref 150–400)
RBC: 4.25 MIL/uL (ref 4.22–5.81)
RDW: 13.8 % (ref 11.5–15.5)
WBC: 5.4 10*3/uL (ref 4.0–10.5)

## 2015-09-02 LAB — I-STAT TROPONIN, ED: Troponin i, poc: 0.02 ng/mL (ref 0.00–0.08)

## 2015-09-02 LAB — BASIC METABOLIC PANEL
ANION GAP: 6 (ref 5–15)
BUN: 18 mg/dL (ref 6–20)
CHLORIDE: 108 mmol/L (ref 101–111)
CO2: 25 mmol/L (ref 22–32)
Calcium: 9.4 mg/dL (ref 8.9–10.3)
Creatinine, Ser: 1.34 mg/dL — ABNORMAL HIGH (ref 0.61–1.24)
GFR calc Af Amer: >60 mL/min (ref >60)
GFR, EST NON AFRICAN AMERICAN: 54 mL/min — AB (ref >60)
GLUCOSE: 225 mg/dL — AB (ref 65–99)
POTASSIUM: 3.8 mmol/L (ref 3.5–5.1)
Sodium: 139 mmol/L (ref 135–145)

## 2015-09-02 MED ORDER — ALBUTEROL SULFATE HFA 108 (90 BASE) MCG/ACT IN AERS
2.0000 | INHALATION_SPRAY | Freq: Once | RESPIRATORY_TRACT | Status: AC
  Administered 2015-09-02: 2 via RESPIRATORY_TRACT
  Filled 2015-09-02: qty 6.7

## 2015-09-02 MED ORDER — HYDROCODONE-HOMATROPINE 5-1.5 MG/5ML PO SYRP: 5.0000 mL | ORAL_SOLUTION | Freq: Four times a day (QID) | ORAL | Status: DC | PRN

## 2015-09-02 MED ORDER — IPRATROPIUM BROMIDE 0.02 % IN SOLN
0.5000 mg | Freq: Once | RESPIRATORY_TRACT | Status: AC
  Administered 2015-09-02: 0.5 mg via RESPIRATORY_TRACT
  Filled 2015-09-02: qty 2.5

## 2015-09-02 MED ORDER — PREDNISONE 20 MG PO TABS: 40.0000 mg | ORAL_TABLET | Freq: Every day | ORAL | Status: DC

## 2015-09-02 MED ORDER — ALBUTEROL SULFATE (2.5 MG/3ML) 0.083% IN NEBU
5.0000 mg | INHALATION_SOLUTION | Freq: Once | RESPIRATORY_TRACT | Status: AC
  Administered 2015-09-02: 5 mg via RESPIRATORY_TRACT
  Filled 2015-09-02: qty 6

## 2015-09-02 MED ORDER — PREDNISONE 20 MG PO TABS
60.0000 mg | ORAL_TABLET | Freq: Once | ORAL | Status: AC
  Administered 2015-09-02: 60 mg via ORAL
  Filled 2015-09-02: qty 3

## 2015-09-02 NOTE — Discharge Instructions (Signed)
Asthma, Adult Asthma is a condition of the lungs in which the airways tighten and narrow. Asthma can make it hard to breathe. Asthma cannot be cured, but medicine and lifestyle changes can help control it. Asthma may be started (triggered) by:  Animal skin flakes (dander).  Dust.  Cockroaches.  Pollen.  Mold.  Smoke.  Cleaning products.  Hair sprays or aerosol sprays.  Paint fumes or strong smells.  Cold air, weather changes, and winds.  Crying or laughing hard.  Stress.  Certain medicines or drugs.  Foods, such as dried fruit, potato chips, and sparkling grape juice.  Infections or conditions (colds, flu).  Exercise.  Certain medical conditions or diseases.  Exercise or tiring activities. HOME CARE   Take medicine as told by your doctor.  Use a peak flow meter as told by your doctor. A peak flow meter is a tool that measures how well the lungs are working.  Record and keep track of the peak flow meter's readings.  Understand and use the asthma action plan. An asthma action plan is a written plan for taking care of your asthma and treating your attacks.  To help prevent asthma attacks:  Do not smoke. Stay away from secondhand smoke.  Change your heating and air conditioning filter often.  Limit your use of fireplaces and wood stoves.  Get rid of pests (such as roaches and mice) and their droppings.  Throw away plants if you see mold on them.  Clean your floors. Dust regularly. Use cleaning products that do not smell.  Have someone vacuum when you are not home. Use a vacuum cleaner with a HEPA filter if possible.  Replace carpet with wood, tile, or vinyl flooring. Carpet can trap animal skin flakes and dust.  Use allergy-proof pillows, mattress covers, and box spring covers.  Wash bed sheets and blankets every week in hot water and dry them in a dryer.  Use blankets that are made of polyester or cotton.  Clean bathrooms and kitchens with bleach.  If possible, have someone repaint the walls in these rooms with mold-resistant paint. Keep out of the rooms that are being cleaned and painted.  Wash hands often. GET HELP IF:  You have make a whistling sound when breaking (wheeze), have shortness of breath, or have a cough even if taking medicine to prevent attacks.  The colored mucus you cough up (sputum) is thicker than usual.  The colored mucus you cough up changes from clear or white to yellow, green, gray, or bloody.  You have problems from the medicine you are taking such as:  A rash.  Itching.  Swelling.  Trouble breathing.  You need reliever medicines more than 2-3 times a week.  Your peak flow measurement is still at 50-79% of your personal best after following the action plan for 1 hour.  You have a fever. GET HELP RIGHT AWAY IF:   You seem to be worse and are not responding to medicine during an asthma attack.  You are short of breath even at rest.  You get short of breath when doing very little activity.  You have trouble eating, drinking, or talking.  You have chest pain.  You have a fast heartbeat.  Your lips or fingernails start to turn blue.  You are light-headed, dizzy, or faint.  Your peak flow is less than 50% of your personal best.   This information is not intended to replace advice given to you by your health care provider. Make sure  you discuss any questions you have with your health care provider.   Document Released: 03/11/2008 Document Revised: 06/14/2015 Document Reviewed: 04/22/2013 Elsevier Interactive Patient Education 2016 Elsevier Inc.  Chronic Back Pain  When back pain lasts longer than 3 months, it is called chronic back pain.People with chronic back pain often go through certain periods that are more intense (flare-ups).  CAUSES Chronic back pain can be caused by wear and tear (degeneration) on different structures in your back. These structures include:  The bones of your  spine (vertebrae) and the joints surrounding your spinal cord and nerve roots (facets).  The strong, fibrous tissues that connect your vertebrae (ligaments). Degeneration of these structures may result in pressure on your nerves. This can lead to constant pain. HOME CARE INSTRUCTIONS  Avoid bending, heavy lifting, prolonged sitting, and activities which make the problem worse.  Take brief periods of rest throughout the day to reduce your pain. Lying down or standing usually is better than sitting while you are resting.  Take over-the-counter or prescription medicines only as directed by your caregiver. SEEK IMMEDIATE MEDICAL CARE IF:   You have weakness or numbness in one of your legs or feet.  You have trouble controlling your bladder or bowels.  You have nausea, vomiting, abdominal pain, shortness of breath, or fainting.   This information is not intended to replace advice given to you by your health care provider. Make sure you discuss any questions you have with your health care provider.   Document Released: 10/31/2004 Document Revised: 12/16/2011 Document Reviewed: 03/13/2015 Elsevier Interactive Patient Education 2016 Elsevier Inc.  Cough, Adult A cough helps to clear your throat and lungs. A cough may last only 2-3 weeks (acute), or it may last longer than 8 weeks (chronic). Many different things can cause a cough. A cough may be a sign of an illness or another medical condition. HOME CARE  Pay attention to any changes in your cough.  Take medicines only as told by your doctor.  If you were prescribed an antibiotic medicine, take it as told by your doctor. Do not stop taking it even if you start to feel better.  Talk with your doctor before you try using a cough medicine.  Drink enough fluid to keep your pee (urine) clear or pale yellow.  If the air is dry, use a cold steam vaporizer or humidifier in your home.  Stay away from things that make you cough at work or at  home.  If your cough is worse at night, try using extra pillows to raise your head up higher while you sleep.  Do not smoke, and try not to be around smoke. If you need help quitting, ask your doctor.  Do not have caffeine.  Do not drink alcohol.  Rest as needed. GET HELP IF:  You have new problems (symptoms).  You cough up yellow fluid (pus).  Your cough does not get better after 2-3 weeks, or your cough gets worse.  Medicine does not help your cough and you are not sleeping well.  You have pain that gets worse or pain that is not helped with medicine.  You have a fever.  You are losing weight and you do not know why.  You have night sweats. GET HELP RIGHT AWAY IF:  You cough up blood.  You have trouble breathing.  Your heartbeat is very fast.   This information is not intended to replace advice given to you by your health care provider. Make  sure you discuss any questions you have with your health care provider.   Document Released: 06/06/2011 Document Revised: 06/14/2015 Document Reviewed: 11/30/2014 Elsevier Interactive Patient Education 2016 Elsevier Inc.  Cough, Adult Coughing is a reflex that clears your throat and your airways. Coughing helps to heal and protect your lungs. It is normal to cough occasionally, but a cough that happens with other symptoms or lasts a long time may be a sign of a condition that needs treatment. A cough may last only 2-3 weeks (acute), or it may last longer than 8 weeks (chronic). CAUSES Coughing is commonly caused by:  Breathing in substances that irritate your lungs.  A viral or bacterial respiratory infection.  Allergies.  Asthma.  Postnasal drip.  Smoking.  Acid backing up from the stomach into the esophagus (gastroesophageal reflux).  Certain medicines.  Chronic lung problems, including COPD (or rarely, lung cancer).  Other medical conditions such as heart failure. HOME CARE INSTRUCTIONS  Pay attention to any  changes in your symptoms. Take these actions to help with your discomfort:  Take medicines only as told by your health care provider.  If you were prescribed an antibiotic medicine, take it as told by your health care provider. Do not stop taking the antibiotic even if you start to feel better.  Talk with your health care provider before you take a cough suppressant medicine.  Drink enough fluid to keep your urine clear or pale yellow.  If the air is dry, use a cold steam vaporizer or humidifier in your bedroom or your home to help loosen secretions.  Avoid anything that causes you to cough at work or at home.  If your cough is worse at night, try sleeping in a semi-upright position.  Avoid cigarette smoke. If you smoke, quit smoking. If you need help quitting, ask your health care provider.  Avoid caffeine.  Avoid alcohol.  Rest as needed. SEEK MEDICAL CARE IF:   You have new symptoms.  You cough up pus.  Your cough does not get better after 2-3 weeks, or your cough gets worse.  You cannot control your cough with suppressant medicines and you are losing sleep.  You develop pain that is getting worse or pain that is not controlled with pain medicines.  You have a fever.  You have unexplained weight loss.  You have night sweats. SEEK IMMEDIATE MEDICAL CARE IF:  You cough up blood.  You have difficulty breathing.  Your heartbeat is very fast.   This information is not intended to replace advice given to you by your health care provider. Make sure you discuss any questions you have with your health care provider.   Document Released: 03/22/2011 Document Revised: 06/14/2015 Document Reviewed: 11/30/2014 Elsevier Interactive Patient Education [employer].

## 2015-09-02 NOTE — ED Provider Notes (Signed)
CSN: [id number]     Arrival date & time 09/02/15  [id number] History   First MD Initiated Contact with Patient 09/02/15 0559     Chief Complaint  Patient presents with  . Cough  . Back Pain    chronic in nature     (Consider location/radiation/quality/duration/timing/severity/associated sxs/prior Treatment) HPI Comments: Patient is a 64 year old male with history of asthma, HIV, HTN, CKD, CAD, substance abuse, chronic back pain, presents to the ER with persistent cough and chronic back pain. His cough has been ongoing and gradually worsening for the past 2 months. He states that he has white sputum production, postnasal drip, shortness of breath, wheeze, chest wall pain worse when coughing.  He states that he has chest tightness, which is unrelieved by his albuterol inhaler. He states that frequently weather changes has made his breathing more difficult lately.  He denies any fevers, chills, lightheadedness, diaphoresis, palpitations, lower extremity edema.  He states that shortness of breath is worse when he is up being active and he denies orthopnea, PND, exertional dyspnea, near syncope or lightheadedness.  Patient denies any sick contacts. He states that his postnasal drip and drainage causing a worsening cough. He has not had any travel outside the [place] recently however states that he just had a 2-1/2 hour car ride and arrived back in town this morning. He denies any leg pain or unilateral swelling. Chart review documents his most recent CD4 count is 360 as of June 2016. He states that he is compliant with his ART tx and denies any recent illegal drug use, he denies smoking hx. States that he has chronic back pain which has recently sent to [facility] for further evaluation. He is scheduled for an MRI due to "two lesions" his spine, but states he's been having problems with pain all over his body, he does not have any pain medications. He denies any numbness, tingling, weakness, urinary retention,  urinary or bowel incontinence.  Patient is a 64 y.o. male presenting with cough and back pain. The history is provided by the patient.  Cough Cough characteristics:  Productive Sputum characteristics:  White Severity:  Mild Onset quality:  Gradual Duration:  8 weeks Timing:  Intermittent Progression:  Worsening Chronicity:  Chronic Smoker: no   Context: upper respiratory infection and weather changes   Context: not exposure to allergens, not sick contacts and not smoke exposure   Relieved by: cough syrup helped but ran out. Worsened by:  Environmental changes, exposure to cold air and activity Ineffective treatments:  Beta-agonist inhaler Associated symptoms: chest pain, rhinorrhea, shortness of breath, sinus congestion and wheezing   Associated symptoms: no chills, no diaphoresis, no ear pain, no fever, no headaches, no myalgias, no rash, no sore throat and no weight loss   Chest pain:    Chest pain quality: tightness and achiness.   Severity:  Mild   Timing:  Intermittent   Progression:  Waxing and waning   Chronicity:  Chronic Rhinorrhea:    Quality:  Clear Shortness of breath:    Severity:  Mild   Timing:  Intermittent   Progression:  Waxing and waning Risk factors: no chemical exposure, no recent infection and no recent travel   Back Pain Associated symptoms: chest pain   Associated symptoms: no fever, no headaches and no weight loss        Past Medical History  Diagnosis Date  . Diabetes mellitus     Diagnosed in 2002, started insulin in 2012  . Hypertension   .  Headache(784.0)     CT head 08/2011: Periventricular and subcortical white matter hypodensities are most in keeping with chronic microangiopathic change  . Gout   . Hyperlipidemia   . HIV infection [facility]) Nov 2012    Followed by [name]  . Crack cocaine use     for 20+ years, has been enrolled in detox programs in the past  . Chondromalacia of medial femoral condyle     Left knee MRI 04/28/12:  Chondromalacia of the medial femoral condyle with slight peripheral degeneration of the meniscocapsular junction of the medial meniscus; followed by sports medicine  . Asthma     No PFTs, history of childhood asthma  . Depression     with history of hospitalization for suicidal ideation  . CAD (coronary artery disease)     a. 06/2013 STEMI/PCI ([facility]): LAD w/ thrombus (treated with BMS), mid 75%, D2 75%; LCX OM2 75%; RCA small, PDA 95%, PLV 95%;  b. 10/2013 Cath/PCI: ISR w/in LAD (Promus DES x 2), borderline OM2 lesion;  c. 01/2014 MV: Intermediate risk, medium-sized distal ant wall infarct w/ very small amt of peri-infarct ischemia. EF 60%.  . Gout 04/28/2012  . Collagen vascular disease ([place])   . Cellulitis 04/2014    left facial   Past Surgical History  Procedure Laterality Date  . Bowel resection    . Back surgery      1988  . Cardiac surgery    . Cervical spine surgery      " rods in my neck "  . Coronary stent placement    . Nm myocar perf wall motion  12/27/2011    normal  . Spine surgery     Family History  Problem Relation Age of Onset  . Diabetes Mother   . Hypertension Mother   . Hyperlipidemia Mother   . Diabetes Father   . Cancer Father   . Hypertension Father   . Diabetes Brother   . Heart disease Brother   . Colon cancer Neg Hx   . Diabetes Sister    Social History  Substance Use Topics  . Smoking status: Never Smoker   . Smokeless tobacco: Never Used  . Alcohol Use: No    Review of Systems  Constitutional: Negative for fever, chills, weight loss and diaphoresis.  HENT: Positive for rhinorrhea. Negative for ear pain and sore throat.   Respiratory: Positive for cough, shortness of breath and wheezing.   Cardiovascular: Positive for chest pain.  Musculoskeletal: Positive for back pain. Negative for myalgias.  Skin: Negative for rash.  Neurological: Negative for headaches.      Allergies  Review of patient's allergies indicates no known allergies.  Home  Medications   Prior to Admission medications   Medication Sig Start Date End Date Taking? Authorizing Provider  albuterol (PROVENTIL HFA;VENTOLIN HFA) 108 (90 BASE) MCG/ACT inhaler Inhale into the lungs every 6 (six) hours as needed for wheezing or shortness of breath.   Yes Historical Provider, MD  allopurinol (ZYLOPRIM) 100 MG tablet Take 2 tablets (200 mg total) by mouth daily. For gouty arthritis 07/10/15  Yes Liberty Handy, MD  aspirin EC 81 MG tablet Take 1 tablet (81 mg total) by mouth daily. For heart health 03/03/15  Yes Norman Herrlich, MD  clopidogrel (PLAVIX) 75 MG tablet Take 1 tablet (75 mg total) by mouth daily. For prevention of stroke 07/10/15  Yes Liberty Handy, MD  DULoxetine (CYMBALTA) 30 MG capsule Take 1 capsule (30 mg total) by mouth  daily. 08/15/15  Yes Niel Hummer, NP  Emtricitab-Rilpivir-Tenofov DF (COMPLERA) 200-25-300 MG TABS Take 1 tablet by mouth daily. 08/15/15  Yes Niel Hummer, NP  famotidine (PEPCID) 20 MG tablet Take 1 tablet (20 mg total) by mouth daily. 8pm 08/15/15  Yes Niel Hummer, NP  ibuprofen (ADVIL) 200 MG tablet Take 1 tablet (200 mg total) by mouth 3 (three) times daily. Every 8 hours for the next 3 days and then back to Q6 prn pain. Patient taking differently: Take 400 mg by mouth every 6 (six) hours as needed for moderate pain.  04/21/15  Yes Burtis Junes, NP  Insulin Glargine (LANTUS SOLOSTAR) 100 UNIT/ML Solostar Pen Inject 15 Units into the skin at bedtime. 08/15/15  Yes Niel Hummer, NP  isosorbide mononitrate (IMDUR) 30 MG 24 hr tablet TAKE 1 TABLET (30 MG TOTAL) BY MOUTH DAILY. 08/30/15  Yes Mihai Croitoru, MD  metFORMIN (GLUCOPHAGE) 1000 MG tablet Take 1 tablet (1,000 mg total) by mouth 2 (two) times daily with a meal. For diabetes management 12/20/14  Yes Encarnacion Slates, NP  pravastatin (PRAVACHOL) 40 MG tablet Take 1 tablet (40 mg total) by mouth daily. For high cholesterol 12/20/14  Yes Encarnacion Slates, NP  HYDROcodone-homatropine (HYCODAN) 5-1.5  MG/5ML syrup Take 5 mLs by mouth every 6 (six) hours as needed for cough. 09/02/15   Delsa Grana, PA-C  losartan (COZAAR) 50 MG tablet Take 1 tablet (50 mg total) by mouth daily. Patient not taking: Reported on 09/02/2015 08/15/15   Niel Hummer, NP  predniSONE (DELTASONE) 20 MG tablet Take 2 tablets (40 mg total) by mouth daily. 09/02/15   Delsa Grana, PA-C   BP 144/86 mmHg  Pulse 91  Temp(Src) 97.7 F (36.5 C) (Oral)  Resp 19  Ht 5' 7.5" (1.715 m)  Wt 79.833 kg  BMI 27.14 kg/m2  SpO2 99% Physical Exam  Constitutional: He is oriented to person, place, and time. He appears well-developed and well-nourished. No distress.  HENT:  Head: Normocephalic and atraumatic.  Right Ear: External ear normal.  Left Ear: External ear normal.  Nose: Nose normal.  Mouth/Throat: No oropharyngeal exudate.  Oral mucosa moist, mild oropharyngeal erythema without edema and without exudate  Eyes: Conjunctivae and EOM are normal. Pupils are equal, round, and reactive to light. Right eye exhibits no discharge. Left eye exhibits no discharge. No scleral icterus.  Neck: Normal range of motion. Neck supple. No JVD present. No tracheal deviation present. No thyromegaly present.  Cardiovascular: Normal rate, regular rhythm, normal heart sounds and intact distal pulses.  Exam reveals no gallop and no friction rub.   No murmur heard. No lower extremity edema, radial pulses symmetrical 2+, dorsal pedis 2+  Pulmonary/Chest: Effort normal and breath sounds normal. No stridor. No respiratory distress. He has no rales.  Negative tachypnea, expiratory wheeze in bilateral lower to midlung fields, no rhonchi or rales. No accessory muscle use, anterior chest wall minimally tender to palpation  Abdominal: Soft. Bowel sounds are normal. He exhibits no distension and no mass. There is no tenderness. There is no rebound and no guarding.  Musculoskeletal: Normal range of motion. He exhibits no edema or tenderness.   Lymphadenopathy:    He has no cervical adenopathy.  Neurological: He is alert and oriented to person, place, and time. He has normal reflexes. No cranial nerve deficit or sensory deficit. He exhibits normal muscle tone. Coordination normal.  Antalgic gait  Skin: Skin is warm and dry. No rash noted.  He is not diaphoretic. No erythema. No pallor.  Psychiatric: He has a normal mood and affect. His behavior is normal. Judgment and thought content normal.  Nursing note and vitals reviewed.   ED Course  Procedures (including critical care time) Labs Review Labs Reviewed  BASIC METABOLIC PANEL - Abnormal; Notable for the following:    Glucose, Bld 225 (*)    Creatinine, Ser 1.34 (*)    GFR calc non Af Amer 54 (*)    All other components within normal limits  CBC WITH DIFFERENTIAL/PLATELET - Abnormal; Notable for the following:    Hemoglobin 10.8 (*)    HCT 33.9 (*)    MCH 25.4 (*)    All other components within normal limits  I-STAT TROPOININ, ED    Imaging Review Dg Chest 2 View  09/02/2015  CLINICAL DATA:  Cough and left-sided chest pain 4 days. EXAM: CHEST  2 VIEW COMPARISON:  08/13/2015 FINDINGS: Lungs are adequately inflated without consolidation or effusion. Cardiomediastinal silhouette is within normal. Left coronary stent unchanged. Mild degenerative change of the spine. IMPRESSION: No active cardiopulmonary disease. Electronically Signed   By: Marin Olp M.D.   On: 09/02/2015 08:09   I have personally reviewed and evaluated these images and lab results as part of my medical decision-making.   EKG Interpretation None      MDM   Final diagnoses:  Asthmatic bronchitis, mild intermittent, uncomplicated  Chronic back pain  Patient with complaints of cough for 2 months and chronic back pain requesting pain medication He complains of postnasal drip, chest tightness and wheezing unrelieved by his albuterol inhaler. He is breathing comfortably in the room without any  increased work of breathing and with 100% oxygen saturation on room air. He has mild expiratory wheeze.   DDx: asthma vs URI/bronchitis, will r/o PNA with CXR, although he denies any fatigue, fever, sweats and I have low suspicion At this time patient is stable, suspect mild exacerbation of asthma versus URI/bronchitis, and he will likely do well with breathing treatments and short burst of steroids. Given his multiple comorbidities basic labs are obtained with chest x-ray EKG. He has tested positive recently for cocaine use although he denies any recent illegal drug use.   Chest x-ray was negative. His labs are pertinent for anemia and elevated serum creatinine, which appear to be stable at his baseline, glucose is elevated 225, patient states he did not take his morning medication.  I discussed with the patient how the short course of steroids will elevate his sugars. He agrees to carefully watch his sugars and take his medicines as prescribed. At this time with his wheeze and 2 months of cough/chest tightness complaints, I feel benefit outweighs the risk.  He does have an appointment 4 days from now.  He was given an albuterol inhaler while in the ED.  The initial duoneb improved his wheeze and chest tightness on re-exam.  Back pain is chronic, without acute issues, he is without neurological deficits, has no fever, no incontinence, no indication for acute workup today.  He has an upcoming appointment and MRI scheduled.  He is advised to see has managing physician for pain management. He is also looked up in the controlled substance database which showed no narcotic pain medication prescribed for him.   Given his history of psychiatric and multiple substance abuse and dependence, he will not be given narcotic pain medication for his chronic back pain which is managed elsewhere, which does not appear to have  any acute issues today.  Filed Vitals:   09/02/15 0700 09/02/15 0715 09/02/15 0800 09/02/15 0815   BP: 160/86 143/88 148/93 144/86  Pulse: 91 91 91 91  Temp:      TempSrc:      Resp: 20 23 19 19   Height:      Weight:      SpO2: 96% 98% 98% 99%   Medications  albuterol (PROVENTIL HFA;VENTOLIN HFA) 108 (90 BASE) MCG/ACT inhaler 2 puff (not administered)  albuterol (PROVENTIL) (2.5 MG/3ML) 0.083% nebulizer solution 5 mg (5 mg Nebulization Given 09/02/15 0631)  ipratropium (ATROVENT) nebulizer solution 0.5 mg (0.5 mg Nebulization Given 09/02/15 0631)  predniSONE (DELTASONE) tablet 60 mg (60 mg Oral Given 09/02/15 0631)      Delsa Grana, PA-C 09/02/15 [id number]  Julianne Rice, MD 09/02/15 2309

## 2015-09-02 NOTE — ED Notes (Signed)
Pt arrives c/o chronic pains and cough with productive sputum. Cough ongoing for four days, states white thick sputum.

## 2015-09-05 ENCOUNTER — Emergency Department (HOSPITAL_COMMUNITY)
Admission: EM | Admit: 2015-09-05 | Discharge: 2015-09-05 | Disposition: A | Discharge status: Home / Self Care | Payer: Medicare Other | Attending: Physician Assistant | Admitting: Physician Assistant

## 2015-09-05 ENCOUNTER — Encounter (HOSPITAL_COMMUNITY): Payer: Self-pay

## 2015-09-05 ENCOUNTER — Emergency Department (HOSPITAL_COMMUNITY): Payer: Medicare Other

## 2015-09-05 DIAGNOSIS — Z7952 Long term (current) use of systemic steroids: Secondary | ICD-10-CM | POA: Diagnosis not present

## 2015-09-05 DIAGNOSIS — Z794 Long term (current) use of insulin: Secondary | ICD-10-CM | POA: Insufficient documentation

## 2015-09-05 DIAGNOSIS — Z872 Personal history of diseases of the skin and subcutaneous tissue: Secondary | ICD-10-CM | POA: Diagnosis not present

## 2015-09-05 DIAGNOSIS — E119 Type 2 diabetes mellitus without complications: Secondary | ICD-10-CM | POA: Insufficient documentation

## 2015-09-05 DIAGNOSIS — M5441 Lumbago with sciatica, right side: Secondary | ICD-10-CM | POA: Diagnosis not present

## 2015-09-05 DIAGNOSIS — I2510 Atherosclerotic heart disease of native coronary artery without angina pectoris: Secondary | ICD-10-CM | POA: Diagnosis not present

## 2015-09-05 DIAGNOSIS — J45909 Unspecified asthma, uncomplicated: Secondary | ICD-10-CM | POA: Insufficient documentation

## 2015-09-05 DIAGNOSIS — K5900 Constipation, unspecified: Secondary | ICD-10-CM | POA: Insufficient documentation

## 2015-09-05 DIAGNOSIS — M5442 Lumbago with sciatica, left side: Secondary | ICD-10-CM | POA: Diagnosis not present

## 2015-09-05 DIAGNOSIS — M109 Gout, unspecified: Secondary | ICD-10-CM | POA: Insufficient documentation

## 2015-09-05 DIAGNOSIS — I129 Hypertensive chronic kidney disease with stage 1 through stage 4 chronic kidney disease, or unspecified chronic kidney disease: Secondary | ICD-10-CM | POA: Insufficient documentation

## 2015-09-05 DIAGNOSIS — G8929 Other chronic pain: Secondary | ICD-10-CM | POA: Insufficient documentation

## 2015-09-05 DIAGNOSIS — R339 Retention of urine, unspecified: Secondary | ICD-10-CM

## 2015-09-05 DIAGNOSIS — M545 Low back pain: Secondary | ICD-10-CM | POA: Diagnosis not present

## 2015-09-05 DIAGNOSIS — Z79899 Other long term (current) drug therapy: Secondary | ICD-10-CM | POA: Diagnosis not present

## 2015-09-05 DIAGNOSIS — B20 Human immunodeficiency virus [HIV] disease: Secondary | ICD-10-CM | POA: Diagnosis not present

## 2015-09-05 DIAGNOSIS — E7800 Pure hypercholesterolemia, unspecified: Secondary | ICD-10-CM | POA: Insufficient documentation

## 2015-09-05 DIAGNOSIS — Z7982 Long term (current) use of aspirin: Secondary | ICD-10-CM | POA: Diagnosis not present

## 2015-09-05 DIAGNOSIS — N189 Chronic kidney disease, unspecified: Secondary | ICD-10-CM | POA: Insufficient documentation

## 2015-09-05 DIAGNOSIS — M5440 Lumbago with sciatica, unspecified side: Secondary | ICD-10-CM | POA: Diagnosis not present

## 2015-09-05 DIAGNOSIS — Z7984 Long term (current) use of oral hypoglycemic drugs: Secondary | ICD-10-CM | POA: Insufficient documentation

## 2015-09-05 DIAGNOSIS — F329 Major depressive disorder, single episode, unspecified: Secondary | ICD-10-CM | POA: Diagnosis not present

## 2015-09-05 LAB — CBC WITH DIFFERENTIAL/PLATELET
BASOS ABS: 0 10*3/uL (ref 0.0–0.1)
BASOS PCT: 0 %
EOS ABS: 0.1 10*3/uL (ref 0.0–0.7)
EOS PCT: 3 %
HCT: 38.1 % — ABNORMAL LOW (ref 39.0–52.0)
Hemoglobin: 12.4 g/dL — ABNORMAL LOW (ref 13.0–17.0)
Lymphocytes Relative: 32 %
Lymphs Abs: 1.4 10*3/uL (ref 0.7–4.0)
MCH: 26.1 pg (ref 26.0–34.0)
MCHC: 32.5 g/dL (ref 30.0–36.0)
MCV: 80 fL (ref 78.0–100.0)
MONO ABS: 0.2 10*3/uL (ref 0.1–1.0)
Monocytes Relative: 5 %
Neutro Abs: 2.7 10*3/uL (ref 1.7–7.7)
Neutrophils Relative %: 60 %
PLATELETS: 170 10*3/uL (ref 150–400)
RBC: 4.76 MIL/uL (ref 4.22–5.81)
RDW: 13.6 % (ref 11.5–15.5)
WBC: 4.5 10*3/uL (ref 4.0–10.5)

## 2015-09-05 LAB — URINE MICROSCOPIC-ADD ON: RBC / HPF: NONE SEEN RBC/hpf (ref 0–5)

## 2015-09-05 LAB — URINALYSIS, ROUTINE W REFLEX MICROSCOPIC
Bilirubin Urine: NEGATIVE
Glucose, UA: >1000 mg/dL — AB
HGB URINE DIPSTICK: NEGATIVE
Ketones, ur: NEGATIVE mg/dL
Leukocytes, UA: NEGATIVE
Nitrite: NEGATIVE
Protein, ur: 30 mg/dL — AB
SPECIFIC GRAVITY, URINE: 1.024 (ref 1.005–1.030)
pH: 5.5 (ref 5.0–8.0)

## 2015-09-05 LAB — COMPREHENSIVE METABOLIC PANEL
ALBUMIN: 4 g/dL (ref 3.5–5.0)
ALT: 18 U/L (ref 17–63)
AST: 20 U/L (ref 15–41)
Alkaline Phosphatase: 72 U/L (ref 38–126)
Anion gap: 8 (ref 5–15)
BUN: 16 mg/dL (ref 6–20)
CHLORIDE: 108 mmol/L (ref 101–111)
CO2: 23 mmol/L (ref 22–32)
Calcium: 9.3 mg/dL (ref 8.9–10.3)
Creatinine, Ser: 1.5 mg/dL — ABNORMAL HIGH (ref 0.61–1.24)
GFR calc Af Amer: 55 mL/min — ABNORMAL LOW (ref >60)
GFR, EST NON AFRICAN AMERICAN: 47 mL/min — AB (ref >60)
GLUCOSE: 274 mg/dL — AB (ref 65–99)
POTASSIUM: 4.1 mmol/L (ref 3.5–5.1)
SODIUM: 139 mmol/L (ref 135–145)
Total Bilirubin: 0.6 mg/dL (ref 0.3–1.2)
Total Protein: 7.4 g/dL (ref 6.5–8.1)

## 2015-09-05 LAB — GLUCOSE, POCT (MANUAL RESULT ENTRY): POC Glucose: 149 mg/dl — AB (ref 70–99)

## 2015-09-05 MED ORDER — DOCUSATE SODIUM 100 MG PO CAPS: 100.0000 mg | ORAL_CAPSULE | Freq: Two times a day (BID) | ORAL | Status: DC

## 2015-09-05 MED ORDER — NAPROXEN 500 MG PO TABS: 500.0000 mg | ORAL_TABLET | Freq: Two times a day (BID) | ORAL | Status: DC

## 2015-09-05 MED ORDER — CYCLOBENZAPRINE HCL 10 MG PO TABS
10.0000 mg | ORAL_TABLET | Freq: Once | ORAL | Status: AC
  Administered 2015-09-05: 10 mg via ORAL
  Filled 2015-09-05: qty 1

## 2015-09-05 MED ORDER — POLYETHYLENE GLYCOL 3350 17 G PO PACK: 17.0000 g | PACK | Freq: Every day | ORAL | Status: DC

## 2015-09-05 MED ORDER — GADOBENATE DIMEGLUMINE 529 MG/ML IV SOLN
8.0000 mL | Freq: Once | INTRAVENOUS | Status: AC | PRN
  Administered 2015-09-05: 8 mL via INTRAVENOUS

## 2015-09-05 MED ORDER — CYCLOBENZAPRINE HCL 10 MG PO TABS: 10.0000 mg | ORAL_TABLET | Freq: Two times a day (BID) | ORAL | Status: DC | PRN

## 2015-09-05 MED ORDER — KETOROLAC TROMETHAMINE 30 MG/ML IJ SOLN
30.0000 mg | Freq: Once | INTRAMUSCULAR | Status: AC
  Administered 2015-09-05: 30 mg via INTRAVENOUS
  Filled 2015-09-05: qty 1

## 2015-09-05 NOTE — Congregational Nurse Program (Signed)
Congregational Nurse Program Note  Date of Encounter: 08/29/2015  Past Medical History: Past Medical History  Diagnosis Date  . Diabetes mellitus     Diagnosed in 2002, started insulin in 2012  . Hypertension   . Headache(784.0)     CT head 08/2011: Periventricular and subcortical white matter hypodensities are most in keeping with chronic microangiopathic change  . Gout   . Hyperlipidemia   . HIV infection [facility]) Nov 2012    Followed by [name]  . Crack cocaine use     for 20+ years, has been enrolled in detox programs in the past  . Chondromalacia of medial femoral condyle     Left knee MRI 04/28/12: Chondromalacia of the medial femoral condyle with slight peripheral degeneration of the meniscocapsular junction of the medial meniscus; followed by sports medicine  . Asthma     No PFTs, history of childhood asthma  . Depression     with history of hospitalization for suicidal ideation  . CAD (coronary artery disease)     a. 06/2013 STEMI/PCI ([facility]): LAD w/ thrombus (treated with BMS), mid 75%, D2 75%; LCX OM2 75%; RCA small, PDA 95%, PLV 95%;  b. 10/2013 Cath/PCI: ISR w/in LAD (Promus DES x 2), borderline OM2 lesion;  c. 01/2014 MV: Intermediate risk, medium-sized distal ant wall infarct w/ very small amt of peri-infarct ischemia. EF 60%.  . Gout 04/28/2012  . Collagen vascular disease ([place])   . Cellulitis 04/2014    left facial    Encounter Details:     CNP Questionnaire - 08/29/15 1659    Patient Demographics   Is this a new or existing patient? Existing   Patient is considered a/an Not Applicable   Patient Assistance   Patient's financial/insurance status Medicaid;Low Income   Patient referred to apply for the following financial assistance Not Applicable   Food insecurities addressed Provided food supplies   Transportation assistance No   Assistance securing medications No   Educational health offerings Not Applicable   Encounter Details   Primary purpose of visit  Chronic Illness/Condition Visit   Was an Emergency Department visit averted? Not Applicable   Does patient have a medical provider? Yes   Patient referred to Not Applicable   Was a mental health screening completed? (GAINS tool) No   Does patient have dental issues? No   Since previous encounter, have you referred patient for abnormal blood pressure that resulted in a new diagnosis or medication change? No   Since previous encounter, have you referred patient for abnormal blood glucose that resulted in a new diagnosis or medication change? No   For Abstraction Use Only   Does patient have insurance? Yes         Amb Nursing Assessment - 08/10/15 0833    Nutrition Screen   BMI - recorded 28.22   Nutritional Risks None   Diabetes Yes   CBG done? Yes   CBG resulted in Enter/ Edit results? Yes   Did pt. bring in CBG monitor from home? No   Glucose Meter Downloaded? No    clinic visit.  CBG screen.  149.  Lunch eaten 1 and 1/2 hours ago.  Reported had been at [facility] for x-ray on back and neck for chronic pain.  States will return in December for MRI

## 2015-09-05 NOTE — ED Notes (Signed)
Patient transported to MRI 

## 2015-09-05 NOTE — ED Notes (Signed)
Patient voided 100 ml.  Bladder scanner revealed additional 468 mL residual volume.

## 2015-09-05 NOTE — ED Notes (Signed)
Patient remains in MRI 

## 2015-09-05 NOTE — Discharge Instructions (Signed)
We are sorry by her chronic back pain. We will leave to continue to follow up with neurosurgery as you are planning. Please return if you have any difficulty with urination or stooling or fevers or weakness.   You have had trouble urinating to completely empty your bladder. Please follow-up with urology. Please come back if you're unable to urinate.  Chronic Back Pain  When back pain lasts longer than 3 months, it is called chronic back pain.People with chronic back pain often go through certain periods that are more intense (flare-ups).  CAUSES Chronic back pain can be caused by wear and tear (degeneration) on different structures in your back. These structures include: 1. The bones of your spine (vertebrae) and the joints surrounding your spinal cord and nerve roots (facets). 2. The strong, fibrous tissues that connect your vertebrae (ligaments). Degeneration of these structures may result in pressure on your nerves. This can lead to constant pain. HOME CARE INSTRUCTIONS 1. Avoid bending, heavy lifting, prolonged sitting, and activities which make the problem worse. 2. Take brief periods of rest throughout the day to reduce your pain. Lying down or standing usually is better than sitting while you are resting. 3. Take over-the-counter or prescription medicines only as directed by your caregiver. SEEK IMMEDIATE MEDICAL CARE IF:  1. You have weakness or numbness in one of your legs or feet. 2. You have trouble controlling your bladder or bowels. 3. You have nausea, vomiting, abdominal pain, shortness of breath, or fainting.   This information is not intended to replace advice given to you by your health care provider. Make sure you discuss any questions you have with your health care provider.   Document Released: 10/31/2004 Document Revised: 12/16/2011 Document Reviewed: 03/13/2015 Elsevier Interactive Patient Education 2016 [employer]  Back Exercises The following exercises  strengthen the muscles that help to support the back. They also help to keep the lower back flexible. Doing these exercises can help to prevent back pain or lessen existing pain. If you have back pain or discomfort, try doing these exercises 2-3 times each day or as told by your health care provider. When the pain goes away, do them once each day, but increase the number of times that you repeat the steps for each exercise (do more repetitions). If you do not have back pain or discomfort, do these exercises once each day or as told by your health care provider. EXERCISES Single Knee to Chest Repeat these steps 3-5 times for each leg: 3. Lie on your back on a firm bed or the floor with your legs extended. 4. Bring one knee to your chest. Your other leg should stay extended and in contact with the floor. 5. Hold your knee in place by grabbing your knee or thigh. 6. Pull on your knee until you feel a gentle stretch in your lower back. 7. Hold the stretch for 10-30 seconds. 8. Slowly release and straighten your leg. Pelvic Tilt Repeat these steps 5-10 times: 4. Lie on your back on a firm bed or the floor with your legs extended. 5. Bend your knees so they are pointing toward the ceiling and your feet are flat on the floor. 6. Tighten your lower abdominal muscles to press your lower back against the floor. This motion will tilt your pelvis so your tailbone points up toward the ceiling instead of pointing to your feet or the floor. 7. With gentle tension and even breathing, hold this position for 5-10 seconds. Cat-Cow Repeat these  steps until your lower back becomes more flexible: 4. Get into a hands-and-knees position on a firm surface. Keep your hands under your shoulders, and keep your knees under your hips. You may place padding under your knees for comfort. 5. Let your head hang down, and point your tailbone toward the floor so your lower back becomes rounded like the back of a cat. 6. Hold this  position for 5 seconds. 7. Slowly lift your head and point your tailbone up toward the ceiling so your back forms a sagging arch like the back of a cow. 8. Hold this position for 5 seconds. Press-Ups Repeat these steps 5-10 times: 1. Lie on your abdomen (face-down) on the floor. 2. Place your palms near your head, about shoulder-width apart. 3. While you keep your back as relaxed as possible and keep your hips on the floor, slowly straighten your arms to raise the top half of your body and lift your shoulders. Do not use your back muscles to raise your upper torso. You may adjust the placement of your hands to make yourself more comfortable. 4. Hold this position for 5 seconds while you keep your back relaxed. 5. Slowly return to lying flat on the floor. Bridges Repeat these steps 10 times: 1. Lie on your back on a firm surface. 2. Bend your knees so they are pointing toward the ceiling and your feet are flat on the floor. 3. Tighten your buttocks muscles and lift your buttocks off of the floor until your waist is at almost the same height as your knees. You should feel the muscles working in your buttocks and the back of your thighs. If you do not feel these muscles, slide your feet 1-2 inches farther away from your buttocks. 4. Hold this position for 3-5 seconds. 5. Slowly lower your hips to the starting position, and allow your buttocks muscles to relax completely. If this exercise is too easy, try doing it with your arms crossed over your chest. Abdominal Crunches Repeat these steps 5-10 times: 1. Lie on your back on a firm bed or the floor with your legs extended. 2. Bend your knees so they are pointing toward the ceiling and your feet are flat on the floor. 3. Cross your arms over your chest. 4. Tip your chin slightly toward your chest without bending your neck. 5. Tighten your abdominal muscles and slowly raise your trunk (torso) high enough to lift your shoulder blades a tiny bit off  of the floor. Avoid raising your torso higher than that, because it can put too much stress on your low back and it does not help to strengthen your abdominal muscles. 6. Slowly return to your starting position. Back Lifts Repeat these steps 5-10 times: 1. Lie on your abdomen (face-down) with your arms at your sides, and rest your forehead on the floor. 2. Tighten the muscles in your legs and your buttocks. 3. Slowly lift your chest off of the floor while you keep your hips pressed to the floor. Keep the back of your head in line with the curve in your back. Your eyes should be looking at the floor. 4. Hold this position for 3-5 seconds. 5. Slowly return to your starting position. SEEK MEDICAL CARE IF:  Your back pain or discomfort gets much worse when you do an exercise.  Your back pain or discomfort does not lessen within 2 hours after you exercise. If you have any of these problems, stop doing these exercises right away. Do  not do them again unless your health care provider says that you can. SEEK IMMEDIATE MEDICAL CARE IF:  You develop sudden, severe back pain. If this happens, stop doing the exercises right away. Do not do them again unless your health care provider says that you can.   This information is not intended to replace advice given to you by your health care provider. Make sure you discuss any questions you have with your health care provider.   Document Released: 10/31/2004 Document Revised: 06/14/2015 Document Reviewed: 11/17/2014 Elsevier Interactive Patient Education 2016 [employer].  Back Exercises The following exercises strengthen the muscles that help to support the back. They also help to keep the lower back flexible. Doing these exercises can help to prevent back pain or lessen existing pain. If you have back pain or discomfort, try doing these exercises 2-3 times each day or as told by your health care provider. When the pain goes away, do them once each day,  but increase the number of times that you repeat the steps for each exercise (do more repetitions). If you do not have back pain or discomfort, do these exercises once each day or as told by your health care provider. EXERCISES Single Knee to Chest Repeat these steps 3-5 times for each leg: 9. Lie on your back on a firm bed or the floor with your legs extended. 10. Bring one knee to your chest. Your other leg should stay extended and in contact with the floor. 11. Hold your knee in place by grabbing your knee or thigh. 12. Pull on your knee until you feel a gentle stretch in your lower back. 13. Hold the stretch for 10-30 seconds. 14. Slowly release and straighten your leg. Pelvic Tilt Repeat these steps 5-10 times: 8. Lie on your back on a firm bed or the floor with your legs extended. 9. Bend your knees so they are pointing toward the ceiling and your feet are flat on the floor. 10. Tighten your lower abdominal muscles to press your lower back against the floor. This motion will tilt your pelvis so your tailbone points up toward the ceiling instead of pointing to your feet or the floor. 11. With gentle tension and even breathing, hold this position for 5-10 seconds. Cat-Cow Repeat these steps until your lower back becomes more flexible: 9. Get into a hands-and-knees position on a firm surface. Keep your hands under your shoulders, and keep your knees under your hips. You may place padding under your knees for comfort. 10. Let your head hang down, and point your tailbone toward the floor so your lower back becomes rounded like the back of a cat. 11. Hold this position for 5 seconds. 12. Slowly lift your head and point your tailbone up toward the ceiling so your back forms a sagging arch like the back of a cow. 13. Hold this position for 5 seconds. Press-Ups Repeat these steps 5-10 times: 6. Lie on your abdomen (face-down) on the floor. 7. Place your palms near your head, about  shoulder-width apart. 8. While you keep your back as relaxed as possible and keep your hips on the floor, slowly straighten your arms to raise the top half of your body and lift your shoulders. Do not use your back muscles to raise your upper torso. You may adjust the placement of your hands to make yourself more comfortable. 9. Hold this position for 5 seconds while you keep your back relaxed. 10. Slowly return to lying flat on  the floor. Bridges Repeat these steps 10 times: 6. Lie on your back on a firm surface. 7. Bend your knees so they are pointing toward the ceiling and your feet are flat on the floor. 8. Tighten your buttocks muscles and lift your buttocks off of the floor until your waist is at almost the same height as your knees. You should feel the muscles working in your buttocks and the back of your thighs. If you do not feel these muscles, slide your feet 1-2 inches farther away from your buttocks. 9. Hold this position for 3-5 seconds. 10. Slowly lower your hips to the starting position, and allow your buttocks muscles to relax completely. If this exercise is too easy, try doing it with your arms crossed over your chest. Abdominal Crunches Repeat these steps 5-10 times: 7. Lie on your back on a firm bed or the floor with your legs extended. 8. Bend your knees so they are pointing toward the ceiling and your feet are flat on the floor. 9. Cross your arms over your chest. 10. Tip your chin slightly toward your chest without bending your neck. 70. Tighten your abdominal muscles and slowly raise your trunk (torso) high enough to lift your shoulder blades a tiny bit off of the floor. Avoid raising your torso higher than that, because it can put too much stress on your low back and it does not help to strengthen your abdominal muscles. 12. Slowly return to your starting position. Back Lifts Repeat these steps 5-10 times: 6. Lie on your abdomen (face-down) with your arms at your sides,  and rest your forehead on the floor. 7. Tighten the muscles in your legs and your buttocks. 8. Slowly lift your chest off of the floor while you keep your hips pressed to the floor. Keep the back of your head in line with the curve in your back. Your eyes should be looking at the floor. 9. Hold this position for 3-5 seconds. 10. Slowly return to your starting position. SEEK MEDICAL CARE IF:  Your back pain or discomfort gets much worse when you do an exercise.  Your back pain or discomfort does not lessen within 2 hours after you exercise. If you have any of these problems, stop doing these exercises right away. Do not do them again unless your health care provider says that you can. SEEK IMMEDIATE MEDICAL CARE IF:  You develop sudden, severe back pain. If this happens, stop doing the exercises right away. Do not do them again unless your health care provider says that you can.   This information is not intended to replace advice given to you by your health care provider. Make sure you discuss any questions you have with your health care provider.   Document Released: 10/31/2004 Document Revised: 06/14/2015 Document Reviewed: 11/17/2014 Elsevier Interactive Patient Education [employer].

## 2015-09-05 NOTE — ED Provider Notes (Addendum)
CSN: [id number]     Arrival date & time 09/05/15  [id number] History   First MD Initiated Contact with Patient 09/05/15 [id number]     Chief Complaint  Patient presents with  . Constipation  . Back Pain     (Consider location/radiation/quality/duration/timing/severity/associated sxs/prior Treatment) HPI   Patient is a 64 year old male with asthma HIV hypertension CKD CAD, chronic back pain presenting today today with chronic back pain.  Patient has a long history of back pain since a work accident 1986, made worse by an [place] in 2015. Patient's been seen multiple times in the emergency department as well as by his primary physician for this back pain. Patient was referred to neurosurgery at [place]. His last appointment was on November 10. At that appointment they ordered several plain films he also states he had an MRI within the last month. Patient also has an appointment scheduled for neurosurgery follow up December 12, 12 days from now. Patient states that this pain has been continuous. He reports that notable prescribe him opiates.  Patient also reports constipation. He denies any IV drug use. Denies any recent manipulations to his back, including injections. Denies any fever. Denies any change in weakness or neurologic symptoms. He reports compliance with his HIV medications. He had recent stay [facility] for cocaine abuse/depression.     Past Medical History  Diagnosis Date  . Diabetes mellitus     Diagnosed in 2002, started insulin in 2012  . Hypertension   . Headache(784.0)     CT head 08/2011: Periventricular and subcortical white matter hypodensities are most in keeping with chronic microangiopathic change  . Gout   . Hyperlipidemia   . HIV infection [facility]) Nov 2012    Followed by [name]  . Crack cocaine use     for 20+ years, has been enrolled in detox programs in the past  . Chondromalacia of medial femoral condyle     Left knee MRI 04/28/12: Chondromalacia of the medial  femoral condyle with slight peripheral degeneration of the meniscocapsular junction of the medial meniscus; followed by sports medicine  . Asthma     No PFTs, history of childhood asthma  . Depression     with history of hospitalization for suicidal ideation  . CAD (coronary artery disease)     a. 06/2013 STEMI/PCI ([facility]): LAD w/ thrombus (treated with BMS), mid 75%, D2 75%; LCX OM2 75%; RCA small, PDA 95%, PLV 95%;  b. 10/2013 Cath/PCI: ISR w/in LAD (Promus DES x 2), borderline OM2 lesion;  c. 01/2014 MV: Intermediate risk, medium-sized distal ant wall infarct w/ very small amt of peri-infarct ischemia. EF 60%.  . Gout 04/28/2012  . Collagen vascular disease ([place])   . Cellulitis 04/2014    left facial   Past Surgical History  Procedure Laterality Date  . Bowel resection    . Back surgery      1988  . Cardiac surgery    . Cervical spine surgery      " rods in my neck "  . Coronary stent placement    . Nm myocar perf wall motion  12/27/2011    normal  . Spine surgery     Family History  Problem Relation Age of Onset  . Diabetes Mother   . Hypertension Mother   . Hyperlipidemia Mother   . Diabetes Father   . Cancer Father   . Hypertension Father   . Diabetes Brother   . Heart disease Brother   .  Colon cancer Neg Hx   . Diabetes Sister    Social History  Substance Use Topics  . Smoking status: Never Smoker   . Smokeless tobacco: Never Used  . Alcohol Use: No    Review of Systems  Constitutional: Negative for fever and activity change.  HENT: Negative for hearing loss.   Eyes: Negative for discharge and redness.  Respiratory: Negative for cough and shortness of breath.   Cardiovascular: Negative for chest pain.  Gastrointestinal: Negative for abdominal pain.  Genitourinary: Negative for dysuria and urgency.  Musculoskeletal: Positive for back pain. Negative for arthralgias.  Allergic/Immunologic: Negative for immunocompromised state.  Neurological: Negative for dizziness,  tremors, seizures, syncope, speech difficulty, weakness and headaches.  Psychiatric/Behavioral: Negative for self-injury and agitation.  All other systems reviewed and are negative.     Allergies  Review of patient's allergies indicates no known allergies.  Home Medications   Prior to Admission medications   Medication Sig Start Date End Date Taking? Authorizing Provider  allopurinol (ZYLOPRIM) 100 MG tablet Take 2 tablets (200 mg total) by mouth daily. For gouty arthritis 07/10/15  Yes Liberty Handy, MD  aspirin EC 81 MG tablet Take 1 tablet (81 mg total) by mouth daily. For heart health 03/03/15  Yes Norman Herrlich, MD  clopidogrel (PLAVIX) 75 MG tablet Take 1 tablet (75 mg total) by mouth daily. For prevention of stroke 07/10/15  Yes Liberty Handy, MD  diclofenac sodium (VOLTAREN) 1 % GEL Apply 1 g topically 4 (four) times daily as needed (knee pain).   Yes Historical Provider, MD  DULoxetine (CYMBALTA) 30 MG capsule Take 1 capsule (30 mg total) by mouth daily. 08/15/15  Yes Niel Hummer, NP  Emtricitab-Rilpivir-Tenofov DF (COMPLERA) 200-25-300 MG TABS Take 1 tablet by mouth daily. 08/15/15  Yes Niel Hummer, NP  famotidine (PEPCID) 20 MG tablet Take 1 tablet (20 mg total) by mouth daily. 8pm 08/15/15  Yes Niel Hummer, NP  HYDROcodone-homatropine [facility]) 5-1.5 MG/5ML syrup Take 5 mLs by mouth every 6 (six) hours as needed for cough. 09/02/15  Yes Delsa Grana, PA-C  ibuprofen (ADVIL) 200 MG tablet Take 1 tablet (200 mg total) by mouth 3 (three) times daily. Every 8 hours for the next 3 days and then back to Q6 prn pain. Patient taking differently: Take 400 mg by mouth every 6 (six) hours as needed for moderate pain.  04/21/15  Yes Burtis Junes, NP  Insulin Glargine (LANTUS SOLOSTAR) 100 UNIT/ML Solostar Pen Inject 15 Units into the skin at bedtime. 08/15/15  Yes Niel Hummer, NP  isosorbide mononitrate (IMDUR) 30 MG 24 hr tablet TAKE 1 TABLET (30 MG TOTAL) BY MOUTH DAILY. 08/30/15  Yes Mihai  Croitoru, MD  metFORMIN (GLUCOPHAGE) 1000 MG tablet Take 1 tablet (1,000 mg total) by mouth 2 (two) times daily with a meal. For diabetes management 12/20/14  Yes Encarnacion Slates, NP  pravastatin (PRAVACHOL) 40 MG tablet Take 1 tablet (40 mg total) by mouth daily. For high cholesterol 12/20/14  Yes Encarnacion Slates, NP  predniSONE (DELTASONE) 20 MG tablet Take 2 tablets (40 mg total) by mouth daily. 09/02/15  Yes Leisa Tapia, PA-C  PROAIR HFA 108 (90 BASE) MCG/ACT inhaler INHALE TWO   PUFFS INTO THE LUNGS EVERY 6 (SIX) HOURS AS NEEDED FOR WHEEZING OR SHORTNESS OF BREATH. 09/04/15  Yes Norman Herrlich, MD  DULoxetine (CYMBALTA) 30 MG capsule Take 1 capsule (30 mg total) by mouth daily. Patient not taking: Reported on 09/05/2015  09/02/15   Norman Herrlich, MD  losartan (COZAAR) 50 MG tablet Take 1 tablet (50 mg total) by mouth daily. Patient not taking: Reported on 09/02/2015 08/15/15   Niel Hummer, NP   BP 137/94 mmHg  Pulse 70  Temp(Src) 98 F (36.7 C) (Oral)  Resp 18  SpO2 100% Physical Exam  Constitutional: He is oriented to person, place, and time. He appears well-nourished.  HENT:  Head: Normocephalic.  Mouth/Throat: Oropharynx is clear and moist.  Eyes: Conjunctivae are normal.  Neck: No tracheal deviation present.  Cardiovascular: Normal rate.   Pulmonary/Chest: Effort normal. No stridor. No respiratory distress.  Abdominal: Soft. There is no tenderness. There is no guarding.  Musculoskeletal: Normal range of motion. He exhibits no edema.  Neurological: He is oriented to person, place, and time. No cranial nerve deficit.  Normal reflexes, strength bilateral lower extremities. Sensation intact onto L5-S! dermatome. Patient has sacral sensation intact. Patient walks baseline with a cane and is at his baseline today.  Skin: Skin is warm and dry. No rash noted. He is not diaphoretic.  Psychiatric: He has a normal mood and affect. His behavior is normal.  Nursing note and vitals  reviewed.   ED Course  Procedures (including critical care time) Labs Review Labs Reviewed  CBC WITH DIFFERENTIAL/PLATELET - Abnormal; Notable for the following:    Hemoglobin 12.4 (*)    HCT 38.1 (*)    All other components within normal limits  COMPREHENSIVE METABOLIC PANEL - Abnormal; Notable for the following:    Glucose, Bld 274 (*)    Creatinine, Ser 1.50 (*)    GFR calc non Af Amer 47 (*)    GFR calc Af Amer 55 (*)    All other components within normal limits  URINALYSIS, ROUTINE W REFLEX MICROSCOPIC (NOT AT [facility]) - Abnormal; Notable for the following:    Glucose, UA >1000 (*)    Protein, ur 30 (*)    All other components within normal limits  URINE MICROSCOPIC-ADD ON - Abnormal; Notable for the following:    Squamous Epithelial / LPF 0-5 (*)    Bacteria, UA RARE (*)    Casts HYALINE CASTS (*)    All other components within normal limits  URINE CULTURE    Imaging Review No results found. I have personally reviewed and evaluated these images and lab results as part of my medical decision-making.   EKG Interpretation None      MDM   Final diagnoses:  None   patient is a 64 year old gentleman with history of chronic back pain among other comorbidities. He is presenting today with back pain, constipation. Patient denies taking any opiate medication that could be causing his constipation. He states he did not take any thing at home to help with constipation. Patient was seen here 3 days ago for similar back pain issues. Patient is frustrated because no one will prescribe opiate medications.  I have considered epidural abscess in this gentleman given his risk factors of HIV. However he does not use IV drug use, no fevers, no neurologic symptoms. He does use crack cocaine. He reports the last couple of days difficulty completely urinating, and constipation . Marland Kitchen His back pain has not changed over time. It is the same as it has been for several months. He has excellent  follow-up with neurosurgery within the next 10 days.  He reports an MRI within the last month but I can't find any records of the MRI.  We will ultrasound  his bladder to make sure that he does not have any bladder retention. Sacral sensation intact. No weakness.   We will treat with ketorolac and Flexeril. I do not want to prescribe any opiate medications for this gentleman. He has negative xrays within the last couple weeks   Unforutnately bladder US shows urinary retention of 400 cc.  This is concerning to me in the setting of HIV, increasing back pain and most recent MRI in 2015.  We will get MRI today to rule out cauda equina/ epidural abscess.   He has multiple physicians he can see an outpatient that he can follow up with including HIV physician, his primary care doctor, and the neurosurgeon that he is sees at [facility].  1:03 PM MR negative for cauda equina or epidural abscess. The mild urinary retention likely due to BPH. We'll have him follow-up with urology. Patient able to urinate at this time. No evidence of infection.Ambulatory and feeling normal state of health with normal vital signs at time of discharge.   Layman Gully Julio Alm, MD 09/05/15 [place] Valda Christenson, MD 09/05/15 1304

## 2015-09-05 NOTE — ED Notes (Signed)
Pt states that he has not had a BM in about 6 days, bowel sounds hypoactive, has not taken anything at home, c/o back pain since then. Pt states that he also is having trouble urinating.

## 2015-09-05 NOTE — ED Notes (Signed)
Patient returned from MRI.

## 2015-09-05 NOTE — ED Notes (Signed)
Patient eating bagged lunch, once finished will be discharged

## 2015-09-05 NOTE — ED Notes (Signed)
MD at bedside. 

## 2015-09-06 LAB — URINE CULTURE: Culture: NO GROWTH

## 2015-09-07 ENCOUNTER — Other Ambulatory Visit: Payer: Self-pay

## 2015-09-09 ENCOUNTER — Encounter (HOSPITAL_COMMUNITY): Payer: Self-pay | Admitting: *Deleted

## 2015-09-09 ENCOUNTER — Encounter (HOSPITAL_COMMUNITY): Payer: Self-pay | Admitting: Emergency Medicine

## 2015-09-09 ENCOUNTER — Emergency Department (HOSPITAL_COMMUNITY)
Admission: EM | Admit: 2015-09-09 | Discharge: 2015-09-09 | Disposition: A | Discharge status: Other Acute Inpatient Hospital | Payer: Medicare Other | Attending: Emergency Medicine | Admitting: Emergency Medicine

## 2015-09-09 ENCOUNTER — Observation Stay (HOSPITAL_COMMUNITY)
Admission: AD | Admit: 2015-09-09 | Discharge: 2015-09-10 | Disposition: A | Discharge status: Home / Self Care | Payer: 59 | Source: Intra-hospital | Attending: Psychiatry | Admitting: Psychiatry

## 2015-09-09 DIAGNOSIS — F1420 Cocaine dependence, uncomplicated: Secondary | ICD-10-CM

## 2015-09-09 DIAGNOSIS — J45909 Unspecified asthma, uncomplicated: Secondary | ICD-10-CM | POA: Insufficient documentation

## 2015-09-09 DIAGNOSIS — Z7982 Long term (current) use of aspirin: Secondary | ICD-10-CM | POA: Diagnosis not present

## 2015-09-09 DIAGNOSIS — M797 Fibromyalgia: Secondary | ICD-10-CM | POA: Diagnosis not present

## 2015-09-09 DIAGNOSIS — Z7902 Long term (current) use of antithrombotics/antiplatelets: Secondary | ICD-10-CM | POA: Diagnosis not present

## 2015-09-09 DIAGNOSIS — N528 Other male erectile dysfunction: Secondary | ICD-10-CM | POA: Insufficient documentation

## 2015-09-09 DIAGNOSIS — Z7952 Long term (current) use of systemic steroids: Secondary | ICD-10-CM | POA: Insufficient documentation

## 2015-09-09 DIAGNOSIS — I2510 Atherosclerotic heart disease of native coronary artery without angina pectoris: Secondary | ICD-10-CM | POA: Diagnosis not present

## 2015-09-09 DIAGNOSIS — F1424 Cocaine dependence with cocaine-induced mood disorder: Secondary | ICD-10-CM | POA: Diagnosis not present

## 2015-09-09 DIAGNOSIS — M549 Dorsalgia, unspecified: Secondary | ICD-10-CM | POA: Insufficient documentation

## 2015-09-09 DIAGNOSIS — E1122 Type 2 diabetes mellitus with diabetic chronic kidney disease: Secondary | ICD-10-CM | POA: Diagnosis not present

## 2015-09-09 DIAGNOSIS — Z794 Long term (current) use of insulin: Secondary | ICD-10-CM | POA: Insufficient documentation

## 2015-09-09 DIAGNOSIS — E1140 Type 2 diabetes mellitus with diabetic neuropathy, unspecified: Secondary | ICD-10-CM | POA: Diagnosis not present

## 2015-09-09 DIAGNOSIS — G8929 Other chronic pain: Secondary | ICD-10-CM | POA: Insufficient documentation

## 2015-09-09 DIAGNOSIS — Z79899 Other long term (current) drug therapy: Secondary | ICD-10-CM | POA: Insufficient documentation

## 2015-09-09 DIAGNOSIS — F329 Major depressive disorder, single episode, unspecified: Secondary | ICD-10-CM | POA: Insufficient documentation

## 2015-09-09 DIAGNOSIS — B20 Human immunodeficiency virus [HIV] disease: Secondary | ICD-10-CM | POA: Insufficient documentation

## 2015-09-09 DIAGNOSIS — I10 Essential (primary) hypertension: Secondary | ICD-10-CM | POA: Insufficient documentation

## 2015-09-09 DIAGNOSIS — F332 Major depressive disorder, recurrent severe without psychotic features: Secondary | ICD-10-CM | POA: Diagnosis not present

## 2015-09-09 DIAGNOSIS — M5416 Radiculopathy, lumbar region: Secondary | ICD-10-CM | POA: Insufficient documentation

## 2015-09-09 DIAGNOSIS — E785 Hyperlipidemia, unspecified: Secondary | ICD-10-CM | POA: Insufficient documentation

## 2015-09-09 DIAGNOSIS — R45851 Suicidal ideations: Secondary | ICD-10-CM | POA: Diagnosis present

## 2015-09-09 DIAGNOSIS — E119 Type 2 diabetes mellitus without complications: Secondary | ICD-10-CM | POA: Insufficient documentation

## 2015-09-09 DIAGNOSIS — I129 Hypertensive chronic kidney disease with stage 1 through stage 4 chronic kidney disease, or unspecified chronic kidney disease: Secondary | ICD-10-CM | POA: Insufficient documentation

## 2015-09-09 DIAGNOSIS — M542 Cervicalgia: Secondary | ICD-10-CM | POA: Diagnosis not present

## 2015-09-09 DIAGNOSIS — Z9861 Coronary angioplasty status: Secondary | ICD-10-CM | POA: Diagnosis not present

## 2015-09-09 DIAGNOSIS — Z21 Asymptomatic human immunodeficiency virus [HIV] infection status: Secondary | ICD-10-CM | POA: Insufficient documentation

## 2015-09-09 DIAGNOSIS — Z7984 Long term (current) use of oral hypoglycemic drugs: Secondary | ICD-10-CM | POA: Insufficient documentation

## 2015-09-09 DIAGNOSIS — M109 Gout, unspecified: Secondary | ICD-10-CM | POA: Insufficient documentation

## 2015-09-09 DIAGNOSIS — D649 Anemia, unspecified: Secondary | ICD-10-CM | POA: Diagnosis not present

## 2015-09-09 DIAGNOSIS — G4700 Insomnia, unspecified: Secondary | ICD-10-CM | POA: Diagnosis not present

## 2015-09-09 DIAGNOSIS — N183 Chronic kidney disease, stage 3 (moderate): Secondary | ICD-10-CM | POA: Insufficient documentation

## 2015-09-09 DIAGNOSIS — Z872 Personal history of diseases of the skin and subcutaneous tissue: Secondary | ICD-10-CM | POA: Diagnosis not present

## 2015-09-09 DIAGNOSIS — F1414 Cocaine abuse with cocaine-induced mood disorder: Secondary | ICD-10-CM | POA: Insufficient documentation

## 2015-09-09 DIAGNOSIS — F1494 Cocaine use, unspecified with cocaine-induced mood disorder: Secondary | ICD-10-CM | POA: Diagnosis present

## 2015-09-09 LAB — LIPASE, BLOOD: Lipase: 38 U/L (ref 11–51)

## 2015-09-09 LAB — GLUCOSE, CAPILLARY
GLUCOSE-CAPILLARY: 298 mg/dL — AB (ref 65–99)
Glucose-Capillary: 158 mg/dL — ABNORMAL HIGH (ref 65–99)

## 2015-09-09 LAB — CBC WITH DIFFERENTIAL/PLATELET
Basophils Absolute: 0 10*3/uL (ref 0.0–0.1)
Basophils Relative: 0 %
Eosinophils Absolute: 0.1 10*3/uL (ref 0.0–0.7)
Eosinophils Relative: 3 %
HEMATOCRIT: 37.3 % — AB (ref 39.0–52.0)
HEMOGLOBIN: 11.8 g/dL — AB (ref 13.0–17.0)
LYMPHS ABS: 0.8 10*3/uL (ref 0.7–4.0)
LYMPHS PCT: 27 %
MCH: 25.9 pg — ABNORMAL LOW (ref 26.0–34.0)
MCHC: 31.6 g/dL (ref 30.0–36.0)
MCV: 81.8 fL (ref 78.0–100.0)
MONO ABS: 0.2 10*3/uL (ref 0.1–1.0)
MONOS PCT: 8 %
NEUTROS ABS: 1.7 10*3/uL (ref 1.7–7.7)
Neutrophils Relative %: 62 %
Platelets: 151 10*3/uL (ref 150–400)
RBC: 4.56 MIL/uL (ref 4.22–5.81)
RDW: 14.4 % (ref 11.5–15.5)
WBC: 2.8 10*3/uL — ABNORMAL LOW (ref 4.0–10.5)

## 2015-09-09 LAB — URINE MICROSCOPIC-ADD ON
BACTERIA UA: NONE SEEN
RBC / HPF: NONE SEEN RBC/hpf (ref 0–5)
SQUAMOUS EPITHELIAL / LPF: NONE SEEN
WBC UA: NONE SEEN WBC/hpf (ref 0–5)

## 2015-09-09 LAB — COMPREHENSIVE METABOLIC PANEL
ALBUMIN: 4.4 g/dL (ref 3.5–5.0)
ALT: 17 U/L (ref 17–63)
AST: 23 U/L (ref 15–41)
Alkaline Phosphatase: 71 U/L (ref 38–126)
Anion gap: 8 (ref 5–15)
BUN: 20 mg/dL (ref 6–20)
CHLORIDE: 104 mmol/L (ref 101–111)
CO2: 25 mmol/L (ref 22–32)
CREATININE: 1.36 mg/dL — AB (ref 0.61–1.24)
Calcium: 9.4 mg/dL (ref 8.9–10.3)
GFR calc Af Amer: >60 mL/min (ref >60)
GFR, EST NON AFRICAN AMERICAN: 53 mL/min — AB (ref >60)
GLUCOSE: 324 mg/dL — AB (ref 65–99)
Potassium: 3.7 mmol/L (ref 3.5–5.1)
Sodium: 137 mmol/L (ref 135–145)
Total Bilirubin: 1 mg/dL (ref 0.3–1.2)
Total Protein: 7.7 g/dL (ref 6.5–8.1)

## 2015-09-09 LAB — URINALYSIS, ROUTINE W REFLEX MICROSCOPIC
Bilirubin Urine: NEGATIVE
KETONES UR: 15 mg/dL — AB
Leukocytes, UA: NEGATIVE
Nitrite: NEGATIVE
PROTEIN: 30 mg/dL — AB
Specific Gravity, Urine: 1.036 — ABNORMAL HIGH (ref 1.005–1.030)
pH: 5.5 (ref 5.0–8.0)

## 2015-09-09 LAB — RAPID URINE DRUG SCREEN, HOSP PERFORMED
Amphetamines: NEGATIVE — AB
Barbiturates: NEGATIVE — AB
Benzodiazepines: NEGATIVE — AB
Cocaine: POSITIVE — AB
OPIATES: NEGATIVE — AB
Tetrahydrocannabinol: NEGATIVE — AB

## 2015-09-09 LAB — ETHANOL

## 2015-09-09 MED ORDER — FAMOTIDINE 20 MG PO TABS
20.0000 mg | ORAL_TABLET | Freq: Every day | ORAL | Status: DC
  Administered 2015-09-10: 20 mg via ORAL
  Filled 2015-09-09: qty 1

## 2015-09-09 MED ORDER — EMTRICITAB-RILPIVIR-TENOFOV DF 200-25-300 MG PO TABS
1.0000 | ORAL_TABLET | Freq: Every day | ORAL | Status: DC
  Administered 2015-09-10: 1 via ORAL
  Filled 2015-09-09 (×3): qty 1

## 2015-09-09 MED ORDER — ASPIRIN EC 81 MG PO TBEC
81.0000 mg | DELAYED_RELEASE_TABLET | Freq: Every day | ORAL | Status: DC
  Administered 2015-09-10: 81 mg via ORAL
  Filled 2015-09-09: qty 1

## 2015-09-09 MED ORDER — METFORMIN HCL 500 MG PO TABS
1000.0000 mg | ORAL_TABLET | Freq: Two times a day (BID) | ORAL | Status: DC
  Filled 2015-09-09 (×2): qty 2

## 2015-09-09 MED ORDER — PRAVASTATIN SODIUM 40 MG PO TABS
40.0000 mg | ORAL_TABLET | Freq: Every day | ORAL | Status: DC
  Administered 2015-09-09: 40 mg via ORAL
  Filled 2015-09-09: qty 1

## 2015-09-09 MED ORDER — NAPROXEN 500 MG PO TABS
500.0000 mg | ORAL_TABLET | Freq: Two times a day (BID) | ORAL | Status: DC
  Administered 2015-09-09: 500 mg via ORAL
  Filled 2015-09-09: qty 1

## 2015-09-09 MED ORDER — ISOSORBIDE MONONITRATE ER 30 MG PO TB24
30.0000 mg | ORAL_TABLET | Freq: Every day | ORAL | Status: DC
  Administered 2015-09-09: 30 mg via ORAL
  Filled 2015-09-09: qty 1

## 2015-09-09 MED ORDER — PRAVASTATIN SODIUM 40 MG PO TABS
40.0000 mg | ORAL_TABLET | Freq: Every day | ORAL | Status: DC
  Administered 2015-09-10: 40 mg via ORAL
  Filled 2015-09-09 (×3): qty 1

## 2015-09-09 MED ORDER — CYCLOBENZAPRINE HCL 10 MG PO TABS: 10.0000 mg | ORAL_TABLET | Freq: Two times a day (BID) | ORAL | Status: DC | PRN

## 2015-09-09 MED ORDER — POLYETHYLENE GLYCOL 3350 17 G PO PACK
17.0000 g | PACK | Freq: Every day | ORAL | Status: DC
  Administered 2015-09-10: 17 g via ORAL
  Filled 2015-09-09 (×3): qty 1

## 2015-09-09 MED ORDER — MAGNESIUM HYDROXIDE 400 MG/5ML PO SUSP: 30.0000 mL | Freq: Every day | ORAL | Status: DC | PRN

## 2015-09-09 MED ORDER — DULOXETINE HCL 30 MG PO CPEP
30.0000 mg | ORAL_CAPSULE | Freq: Every day | ORAL | Status: DC
  Administered 2015-09-09 – 2015-09-10 (×2): 30 mg via ORAL
  Filled 2015-09-09 (×2): qty 1

## 2015-09-09 MED ORDER — INSULIN GLARGINE 100 UNIT/ML ~~LOC~~ SOLN
15.0000 [IU] | Freq: Every day | SUBCUTANEOUS | Status: DC
  Filled 2015-09-09: qty 0.15

## 2015-09-09 MED ORDER — CLOPIDOGREL BISULFATE 75 MG PO TABS
75.0000 mg | ORAL_TABLET | Freq: Every day | ORAL | Status: DC
Start: 2015-09-10 — End: 2015-09-10
  Administered 2015-09-10: 75 mg via ORAL
  Filled 2015-09-09: qty 1

## 2015-09-09 MED ORDER — INSULIN GLARGINE 100 UNIT/ML ~~LOC~~ SOLN
15.0000 [IU] | Freq: Every day | SUBCUTANEOUS | Status: DC
  Administered 2015-09-09: 15 [IU] via SUBCUTANEOUS

## 2015-09-09 MED ORDER — ALUM & MAG HYDROXIDE-SIMETH 200-200-20 MG/5ML PO SUSP: 30.0000 mL | ORAL | Status: DC | PRN

## 2015-09-09 MED ORDER — ASPIRIN EC 81 MG PO TBEC
81.0000 mg | DELAYED_RELEASE_TABLET | Freq: Every day | ORAL | Status: DC
  Administered 2015-09-09: 81 mg via ORAL
  Filled 2015-09-09: qty 1

## 2015-09-09 MED ORDER — FAMOTIDINE 20 MG PO TABS
20.0000 mg | ORAL_TABLET | Freq: Every day | ORAL | Status: DC
  Administered 2015-09-09: 20 mg via ORAL
  Filled 2015-09-09: qty 1

## 2015-09-09 MED ORDER — ALLOPURINOL 100 MG PO TABS
200.0000 mg | ORAL_TABLET | Freq: Every day | ORAL | Status: DC
Start: 2015-09-09 — End: 2015-09-09
  Administered 2015-09-09: 200 mg via ORAL
  Filled 2015-09-09: qty 2

## 2015-09-09 MED ORDER — ISOSORBIDE MONONITRATE ER 30 MG PO TB24
30.0000 mg | ORAL_TABLET | Freq: Every day | ORAL | Status: DC
  Administered 2015-09-10: 30 mg via ORAL
  Filled 2015-09-09 (×3): qty 1

## 2015-09-09 MED ORDER — POLYETHYLENE GLYCOL 3350 17 G PO PACK
17.0000 g | PACK | Freq: Every day | ORAL | Status: DC
  Administered 2015-09-09: 17 g via ORAL
  Filled 2015-09-09: qty 1

## 2015-09-09 MED ORDER — METFORMIN HCL 500 MG PO TABS
1000.0000 mg | ORAL_TABLET | Freq: Two times a day (BID) | ORAL | Status: DC
  Administered 2015-09-09 – 2015-09-10 (×2): 1000 mg via ORAL
  Filled 2015-09-09 (×2): qty 2

## 2015-09-09 MED ORDER — ALBUTEROL SULFATE HFA 108 (90 BASE) MCG/ACT IN AERS: 1.0000 | INHALATION_SPRAY | RESPIRATORY_TRACT | Status: DC | PRN

## 2015-09-09 MED ORDER — CLOPIDOGREL BISULFATE 75 MG PO TABS
75.0000 mg | ORAL_TABLET | Freq: Every day | ORAL | Status: DC
  Administered 2015-09-09: 75 mg via ORAL
  Filled 2015-09-09: qty 1

## 2015-09-09 MED ORDER — ALLOPURINOL 100 MG PO TABS
200.0000 mg | ORAL_TABLET | Freq: Every day | ORAL | Status: DC
  Administered 2015-09-10: 200 mg via ORAL
  Filled 2015-09-09 (×3): qty 2

## 2015-09-09 MED ORDER — NAPROXEN 500 MG PO TABS
500.0000 mg | ORAL_TABLET | Freq: Two times a day (BID) | ORAL | Status: DC
  Administered 2015-09-09 – 2015-09-10 (×2): 500 mg via ORAL
  Filled 2015-09-09 (×2): qty 1

## 2015-09-09 MED ORDER — EMTRICITAB-RILPIVIR-TENOFOV DF 200-25-300 MG PO TABS
1.0000 | ORAL_TABLET | Freq: Every day | ORAL | Status: DC
  Filled 2015-09-09: qty 1

## 2015-09-09 MED ORDER — CYCLOBENZAPRINE HCL 5 MG PO TABS
10.0000 mg | ORAL_TABLET | Freq: Two times a day (BID) | ORAL | Status: DC | PRN
  Administered 2015-09-09: 10 mg via ORAL
  Filled 2015-09-09: qty 2

## 2015-09-09 MED ORDER — ACETAMINOPHEN 325 MG PO TABS
650.0000 mg | ORAL_TABLET | Freq: Four times a day (QID) | ORAL | Status: DC | PRN
  Administered 2015-09-09: 650 mg via ORAL
  Filled 2015-09-09: qty 2

## 2015-09-09 NOTE — ED Notes (Signed)
Report given to behavioral health nurse. [facility] contacted for transfer.

## 2015-09-09 NOTE — ED Notes (Signed)
Pt wanded by security, report called to [place], [employer] with one personal belonging bag

## 2015-09-09 NOTE — BH Assessment (Addendum)
Tele Assessment Note   Michael [name] is an 64 y.o. male. Pt presents voluntarily to [facility] family friend. He endorses chronic SI but states his SI has increased over the past 5 days during his relapse on crack cocaine. Pt is cooperative and oriented x 4. He is soft spoken and uses a four point cane. Pt reports he can do all his ADLs but avoids stairs if possible. He says he is staying in [name] currently. Pt sts he thinks about walking into traffic. He says he hears voices that tell him to hurt himself. Pt denies hx of suicide attempts other than the 5 day crack binge. Pt says, "I just don't want to live anymore." Writer ran pt by Darleene Cleaver MD and Waylan Boga DNP who recommends pt go to OBS unit. Pt reports he goes to  [facility] for [facility] and neck pain. He says he was given pain killers after presenting to [facility] recently. Pt says the pain killers triggered him and he started a 5 day crack binge. Pt sts he had been clean from crack for 2 mos prior to five days ago. He reports he used approx $1200 crack in past 5 days. He says he isn't currently seeing a psychiatrist or a therapist. He endorses insomnia, tearfulness, isolating, fatigue, worthlessness. He endorses depressed mood. Writer ran pt by Dr Darleene Cleaver and Waylan Boga DNP who recommends pt go to OBS.   Diagnosis:  Cocaine Abuse Disorder, Moderate Substance Induced Mood Disorder  Past Medical History:  Past Medical History  Diagnosis Date  . Diabetes mellitus     Diagnosed in 2002, started insulin in 2012  . Hypertension   . Headache(784.0)     CT head 08/2011: Periventricular and subcortical white matter hypodensities are most in keeping with chronic microangiopathic change  . Gout   . Hyperlipidemia   . HIV infection [facility]) Nov 2012    Followed by [name]  . Crack cocaine use     for 20+ years, has been enrolled in detox programs in the past  . Chondromalacia of medial femoral condyle     Left knee MRI 04/28/12:  Chondromalacia of the medial femoral condyle with slight peripheral degeneration of the meniscocapsular junction of the medial meniscus; followed by sports medicine  . Asthma     No PFTs, history of childhood asthma  . Depression     with history of hospitalization for suicidal ideation  . CAD (coronary artery disease)     a. 06/2013 STEMI/PCI ([facility]): LAD w/ thrombus (treated with BMS), mid 75%, D2 75%; LCX OM2 75%; RCA small, PDA 95%, PLV 95%;  b. 10/2013 Cath/PCI: ISR w/in LAD (Promus DES x 2), borderline OM2 lesion;  c. 01/2014 MV: Intermediate risk, medium-sized distal ant wall infarct w/ very small amt of peri-infarct ischemia. EF 60%.  . Gout 04/28/2012  . Collagen vascular disease ([place])   . Cellulitis 04/2014    left facial    Past Surgical History  Procedure Laterality Date  . Bowel resection    . Back surgery      1988  . Cardiac surgery    . Cervical spine surgery      " rods in my neck "  . Coronary stent placement    . Nm myocar perf wall motion  12/27/2011    normal  . Spine surgery      Family History:  Family History  Problem Relation Age of Onset  . Diabetes Mother   . Hypertension  Mother   . Hyperlipidemia Mother   . Diabetes Father   . Cancer Father   . Hypertension Father   . Diabetes Brother   . Heart disease Brother   . Colon cancer Neg Hx   . Diabetes Sister     Social History:  reports that he has never smoked. He has never used smokeless tobacco. He reports that he uses illicit drugs ("Crack" cocaine and Cocaine). He reports that he does not drink alcohol.  Additional Social History:  Alcohol / Drug Use Pain Medications: pt denies abuse Prescriptions: pt denies abuse Over the Counter: pt denies abuse History of alcohol / drug use?: Yes Longest period of sobriety (when/how long): 3 yrs Substance #1 Name of Substance 1: cocaine 1 - Age of First Use: 41 1 - Amount (size/oz): varies - 1 - Duration: used approx $1200 for past five days 1 - Last Use /  Amount: 09/09/15 - am  CIWA: CIWA-Ar BP: 133/87 mmHg Pulse Rate: 92 COWS:    PATIENT STRENGTHS: (choose at least two) Ability for insight Average or above average intelligence Communication skills  Allergies: No Known Allergies  Home Medications:  (Not in a hospital admission)  OB/GYN Status:  No LMP for male patient.  General Assessment Data Location of Assessment: [facility] TTS Assessment: In system Is this a Tele or Face-to-Face Assessment?: Face-to-Face Is this an Initial Assessment or a Re-assessment for this encounter?: Initial Assessment Marital status: Divorced Living Arrangements: Other (Comment) ([facility]) Can pt return to current living arrangement?: Yes Admission Status: Voluntary Is patient capable of signing voluntary admission?: Yes Referral Source: Self/Family/Friend Insurance type: [employer]     Crisis Care Plan Living Arrangements: Other (Comment) ([facility]) Name of Psychiatrist: None Name of Therapist: None  Education Status Is patient currently in school?: No Highest grade of school patient has completed: 12  Risk to self with the past 6 months Suicidal Ideation: Yes-Currently Present Has patient been a risk to self within the past 6 months prior to admission? : No (passive SI with no planj) Suicidal Intent: No Has patient had any suicidal intent within the past 6 months prior to admission? : No Is patient at risk for suicide?: No Suicidal Plan?: No (pt sts he thinks of walking into traffic) Has patient had any suicidal plan within the past 6 months prior to admission? : Other (comment) (passive) Access to Means: Yes Specify Access to Suicidal Means: access to traffic What has been your use of drugs/alcohol within the last 12 months?: used cocaine for past 5 days Previous Attempts/Gestures:  (pt sts he tried to overdose on cocaine past 5 days) How many times?: 0 Other Self Harm Risks: none Triggers for Past Attempts:  Other (Comment) (chronic pain) Intentional Self Injurious Behavior: None Family Suicide History: No Recent stressful life event(s): Recent negative physical changes (chronic pain, headaches) Persecutory voices/beliefs?: No Depression: Yes Depression Symptoms: Insomnia, Isolating, Tearfulness, Fatigue, Feeling worthless/self pity Substance abuse history and/or treatment for substance abuse?: Yes Suicide prevention information given to non-admitted patients: Not applicable  Risk to Others within the past 6 months Homicidal Ideation: No Does patient have any lifetime risk of violence toward others beyond the six months prior to admission? : No Thoughts of Harm to Others: No Current Homicidal Intent: No Current Homicidal Plan: No Access to Homicidal Means: No Identified Victim: none History of harm to others?: No Assessment of Violence: None Noted Violent Behavior Description: pt denies hx of violence - is calm  Does patient have access to weapons?: No Criminal Charges Pending?: No Does patient have a court date: No Is patient on probation?: No  Psychosis Hallucinations: Auditory Delusions: None noted  Mental Status Report Appearance/Hygiene: Unremarkable, In scrubs Eye Contact: Good Motor Activity: Freedom of movement Speech: Soft, Logical/coherent Level of Consciousness: Quiet/awake, Alert Mood: Depressed, Sad, Worthless, low self-esteem Affect: Appropriate to circumstance Anxiety Level: None Thought Processes: Coherent Judgement: Impaired Orientation: Person, Place, Time, Situation Obsessive Compulsive Thoughts/Behaviors: None  Cognitive Functioning Concentration: Normal Memory: Recent Intact, Remote Intact IQ: Average Insight: Good Impulse Control: Poor Appetite: Poor Weight Loss:  (pt sts has lost unknown amount of weights) Sleep: Decreased Total Hours of Sleep:  (pt sts barely slept lately) Vegetative Symptoms: None  ADLScreening [facility] Assessment  Services) Patient's cognitive ability adequate to safely complete daily activities?: Yes Patient able to express need for assistance with ADLs?: Yes Independently performs ADLs?: Yes (appropriate for developmental age)  Prior Inpatient Therapy Prior Inpatient Therapy: Yes Prior Therapy Dates: 20 yrs ago Prior Therapy Facilty/Provider(s): [facility] Reason for Treatment: cocaine use, MDD  Prior Outpatient Therapy Prior Outpatient Therapy: No Prior Therapy Dates: na Prior Therapy Facilty/Provider(s): na Reason for Treatment: na Does patient have an ACCT team?: No Does patient have Intensive In-House Services?  : No Does patient have [facility] services? : Unknown Does patient have P4CC services?: Unknown  ADL Screening (condition at time of admission) Patient's cognitive ability adequate to safely complete daily activities?: Yes Is the patient deaf or have difficulty hearing?: No Does the patient have difficulty seeing, even when wearing glasses/contacts?: No Does the patient have difficulty concentrating, remembering, or making decisions?: Yes Patient able to express need for assistance with ADLs?: Yes Does the patient have difficulty dressing or bathing?: Yes Independently performs ADLs?: Yes (appropriate for developmental age) Dressing (OT): Needs assistance Bathing: Independent with device (comment) Does the patient have difficulty walking or climbing stairs?: Yes Weakness of Legs: None Weakness of Arms/Hands: None  Home Assistive Devices/Equipment Home Assistive Devices/Equipment: Cane (specify quad or straight)    Abuse/Neglect Assessment (Assessment to be complete while patient is alone) Physical Abuse: Denies Verbal Abuse: Denies Sexual Abuse: Denies Exploitation of patient/patient's resources: Denies Self-Neglect: Denies     Regulatory affairs officer (For Healthcare) Does patient have an advance directive?: No Would patient like information on creating an  advanced directive?: No - patient declined information    Additional Information 1:1 In Past 12 Months?: No CIRT Risk: No Elopement Risk: No Does patient have medical clearance?: Yes     Disposition:  Disposition Initial Assessment Completed for this Encounter: Yes Disposition of Patient: Other dispositions Other disposition(s):  (pending psych evaluation)  Eustacia Urbanek P 09/09/2015 11:06 AM

## 2015-09-09 NOTE — ED Notes (Signed)
Per patient, states he has been smoking crack-hearing voices telling him to walk into traffice

## 2015-09-09 NOTE — Progress Notes (Signed)
Patient ID: Michael [name], male   DOB: [birth year], 64 y.o.   MRN: [id number] Admission Note-Sent over from [place] at [facility] ED. He was recently here in OBS with a similar presentation. He came to the ED this am after relapsing on Cocaine, crack after getting some pain meds in the ED which triggered his use. He hasnt used in two months and using again made him feel bad about self and he had thoughts of suicide by walking out in front of traffic. He is currently able to contract for safety. He is currently living at the shelter in [place], and they allow him to stay in all day due to his health issues. He is also HIV positive, hypertensive and insulin dependant diabetic. He has a sad affect, soft spoken, reports pain is a 10 in his back and his chronic pain makes him feel tense. He does have an appt on 12/12 at [facility] for a MRI on his back. Cooperative with admission process.

## 2015-09-09 NOTE — Progress Notes (Signed)
Patient resting at the beginning of the shift. Continue to endorse chronic back pain. Denies SI, AH/VH. CBG at bedtime was 158 mg/dl. 15 unit of insulin Novolog administered. Minimal conversation. No new complaint. Continue to sleep on/off. Sleeping at this time. Support and encouragement offered as needed. Will continue to monitor patient.

## 2015-09-09 NOTE — Progress Notes (Signed)
[name] INPATIENT:  Family/Significant Other Suicide Prevention Education  Suicide Prevention Education:  Patient Refusal for Family/Significant Other Suicide Prevention Education: The patient Michael [name] has refused to provide written consent for family/significant other to be provided Family/Significant Other Suicide Prevention Education during admission and/or prior to discharge.  Physician notified.  Davina Poke 09/09/2015, 3:15 PM

## 2015-09-09 NOTE — ED Provider Notes (Signed)
CSN: [id number]     Arrival date & time 09/09/15  0917 History   First MD Initiated Contact with Patient 09/09/15 [phone]     Chief Complaint  Patient presents with  . Suicidal     (Consider location/radiation/quality/duration/timing/severity/associated sxs/prior Treatment) HPI  Patient presents with concern of suicidal ideation, and describing suicide attempt. He acknowledges history of depression for a long time, chronic back pain, denies new physical pain or complaints. He states that over the past day, with increased thoughts of suicide, he has been using crack cocaine to try to kill himself. No recent hospitalizations, but the patient has seen outpatient psychiatry recently. No recent medication changes, and he continues to take medication for his chronic back pain.  Past Medical History  Diagnosis Date  . Diabetes mellitus     Diagnosed in 2002, started insulin in 2012  . Hypertension   . Headache(784.0)     CT head 08/2011: Periventricular and subcortical white matter hypodensities are most in keeping with chronic microangiopathic change  . Gout   . Hyperlipidemia   . HIV infection [facility]) Nov 2012    Followed by [name]  . Crack cocaine use     for 20+ years, has been enrolled in detox programs in the past  . Chondromalacia of medial femoral condyle     Left knee MRI 04/28/12: Chondromalacia of the medial femoral condyle with slight peripheral degeneration of the meniscocapsular junction of the medial meniscus; followed by sports medicine  . Asthma     No PFTs, history of childhood asthma  . Depression     with history of hospitalization for suicidal ideation  . CAD (coronary artery disease)     a. 06/2013 STEMI/PCI ([facility]): LAD w/ thrombus (treated with BMS), mid 75%, D2 75%; LCX OM2 75%; RCA small, PDA 95%, PLV 95%;  b. 10/2013 Cath/PCI: ISR w/in LAD (Promus DES x 2), borderline OM2 lesion;  c. 01/2014 MV: Intermediate risk, medium-sized distal ant wall infarct w/ very small  amt of peri-infarct ischemia. EF 60%.  . Gout 04/28/2012  . Collagen vascular disease ([place])   . Cellulitis 04/2014    left facial   Past Surgical History  Procedure Laterality Date  . Bowel resection    . Back surgery      1988  . Cardiac surgery    . Cervical spine surgery      " rods in my neck "  . Coronary stent placement    . Nm myocar perf wall motion  12/27/2011    normal  . Spine surgery     Family History  Problem Relation Age of Onset  . Diabetes Mother   . Hypertension Mother   . Hyperlipidemia Mother   . Diabetes Father   . Cancer Father   . Hypertension Father   . Diabetes Brother   . Heart disease Brother   . Colon cancer Neg Hx   . Diabetes Sister    Social History  Substance Use Topics  . Smoking status: Never Smoker   . Smokeless tobacco: Never Used  . Alcohol Use: No    Review of Systems  Constitutional:       Per HPI, otherwise negative  HENT:       Per HPI, otherwise negative  Respiratory:       Per HPI, otherwise negative  Cardiovascular:       Per HPI, otherwise negative  Gastrointestinal: Negative for vomiting.  Endocrine:       Negative  aside from HPI  Genitourinary:       Neg aside from HPI   Musculoskeletal:       Per HPI, otherwise negative  Skin: Negative.   Neurological: Negative for syncope.  Psychiatric/Behavioral: Positive for suicidal ideas and dysphoric mood.      Allergies  Review of patient's allergies indicates no known allergies.  Home Medications   Prior to Admission medications   Medication Sig Start Date End Date Taking? Authorizing Provider  allopurinol (ZYLOPRIM) 100 MG tablet Take 2 tablets (200 mg total) by mouth daily. For gouty arthritis 07/10/15  Yes Liberty Handy, MD  aspirin EC 81 MG tablet Take 1 tablet (81 mg total) by mouth daily. For heart health 03/03/15  Yes Norman Herrlich, MD  clopidogrel (PLAVIX) 75 MG tablet Take 1 tablet (75 mg total) by mouth daily. For prevention of stroke 07/10/15  Yes Liberty Handy, MD  cyclobenzaprine (FLEXERIL) 10 MG tablet Take 1 tablet (10 mg total) by mouth 2 (two) times daily as needed for muscle spasms. 09/05/15  Yes Courteney Lyn Mackuen, MD  diclofenac sodium (VOLTAREN) 1 % GEL Apply 1 g topically 4 (four) times daily as needed (knee pain).   Yes Historical Provider, MD  docusate sodium (COLACE) 100 MG capsule Take 1 capsule (100 mg total) by mouth every 12 (twelve) hours. 09/05/15  Yes Courteney Lyn Mackuen, MD  DULoxetine (CYMBALTA) 30 MG capsule Take 1 capsule (30 mg total) by mouth daily. 08/15/15  Yes Niel Hummer, NP  Emtricitab-Rilpivir-Tenofov DF (COMPLERA) 200-25-300 MG TABS Take 1 tablet by mouth daily. 08/15/15  Yes Niel Hummer, NP  famotidine (PEPCID) 20 MG tablet Take 1 tablet (20 mg total) by mouth daily. 8pm 08/15/15  Yes Niel Hummer, NP  ibuprofen (ADVIL) 200 MG tablet Take 1 tablet (200 mg total) by mouth 3 (three) times daily. Every 8 hours for the next 3 days and then back to Q6 prn pain. Patient taking differently: Take 400 mg by mouth every 6 (six) hours as needed for moderate pain.  04/21/15  Yes Burtis Junes, NP  Insulin Glargine (LANTUS SOLOSTAR) 100 UNIT/ML Solostar Pen Inject 15 Units into the skin at bedtime. 08/15/15  Yes Niel Hummer, NP  isosorbide mononitrate (IMDUR) 30 MG 24 hr tablet TAKE 1 TABLET (30 MG TOTAL) BY MOUTH DAILY. 08/30/15  Yes Mihai Croitoru, MD  metFORMIN (GLUCOPHAGE) 1000 MG tablet Take 1 tablet (1,000 mg total) by mouth 2 (two) times daily with a meal. For diabetes management 12/20/14  Yes Encarnacion Slates, NP  naproxen (NAPROSYN) 500 MG tablet Take 1 tablet (500 mg total) by mouth 2 (two) times daily. 09/05/15  Yes Courteney Lyn Mackuen, MD  polyethylene glycol (MIRALAX) packet Take 17 g by mouth daily. 09/05/15  Yes Courteney Lyn Mackuen, MD  pravastatin (PRAVACHOL) 40 MG tablet Take 1 tablet (40 mg total) by mouth daily. For high cholesterol 12/20/14  Yes Encarnacion Slates, NP  predniSONE (DELTASONE) 20 MG tablet Take  2 tablets (40 mg total) by mouth daily. 09/02/15  Yes Leisa Tapia, PA-C  PROAIR HFA 108 (90 BASE) MCG/ACT inhaler INHALE TWO   PUFFS INTO THE LUNGS EVERY 6 (SIX) HOURS AS NEEDED FOR WHEEZING OR SHORTNESS OF BREATH. 09/04/15  Yes Norman Herrlich, MD  HYDROcodone-homatropine [facility]) 5-1.5 MG/5ML syrup Take 5 mLs by mouth every 6 (six) hours as needed for cough. Patient not taking: Reported on 09/09/2015 09/02/15   Delsa Grana, PA-C  losartan (COZAAR) 50 MG  tablet Take 1 tablet (50 mg total) by mouth daily. Patient not taking: Reported on 09/02/2015 08/15/15   Niel Hummer, NP   BP 151/120 mmHg  Pulse 92  Temp(Src) 97.5 F (36.4 C) (Oral)  Resp 16  SpO2 100% Physical Exam  Constitutional: He is oriented to person, place, and time. He appears well-developed. No distress.  HENT:  Head: Normocephalic and atraumatic.  Eyes: Conjunctivae and EOM are normal.  Cardiovascular: Normal rate and regular rhythm.   Pulmonary/Chest: Effort normal. No stridor. No respiratory distress.  Abdominal: He exhibits no distension.  Musculoskeletal: He exhibits no edema.  Neurological: He is alert and oriented to person, place, and time. No cranial nerve deficit. He exhibits normal muscle tone. Coordination normal.  Skin: Skin is warm and dry.  Psychiatric: He exhibits a depressed mood. He expresses suicidal ideation. He expresses suicidal plans.  Nursing note and vitals reviewed.   ED Course  Procedures (including critical care time) Labs Review Labs Reviewed  COMPREHENSIVE METABOLIC PANEL  ETHANOL  LIPASE, BLOOD  CBC WITH DIFFERENTIAL/PLATELET  URINALYSIS, ROUTINE W REFLEX MICROSCOPIC (NOT AT [facility])  URINE RAPID DRUG SCREEN, HOSP PERFORMED    Imaging Review No results found. I have personally reviewed and evaluated these images and lab results as part of my medical decision-making.   EKG Interpretation   Date/Time:  Saturday September 09 2015 09:50:38 EST Ventricular Rate:  94 PR Interval:   142 QRS Duration: 71 QT Interval:  365 QTC Calculation: 456 R Axis:   26 Text Interpretation:  Sinus arrhythmia Left ventricular hypertrophy Sinus  arrhythmia Artifact Early repolarization pattern Left ventricular  hypertrophy No significant change since last tracing Abnormal ekg  Confirmed by Carmin Muskrat  MD [phone]) on 09/09/2015 10:00:46 AM     Chart review notable for psychiatric evaluation within the past 2 months with concern for substance abuse associated mood disorder. MDM  Patient chronic back pain, polysubstance abuse presents with increased suicidal ideation. Patient is using crack cocaine, but has no evidence for ongoing ischemia, nor other new physical complaints. Patient medically cleared for psychiatric evaluation.  Carmin Muskrat, MD 09/09/15 1048

## 2015-09-09 NOTE — H&P (Signed)
[facility] OBS UNIT H&P   Patient Identification: Michael [name] MRN: [id number] Principal Diagnosis: MDD (major depressive disorder), recurrent severe, without psychosis ([place]) Diagnosis:  Patient Active Problem List   Diagnosis Date Noted  . MDD (major depressive disorder), recurrent severe, without psychosis ([place]) [F33.2] 09/09/2015    Priority: High  . Cocaine-induced mood disorder ([place]) [F14.94] 08/14/2015    Priority: Medium  . Cocaine dependence with cocaine-induced mood disorder ([place]) [F14.24]   . Cocaine dependence ([place]) [F14.20] 08/14/2015  . Cocaine abuse with cocaine-induced mood disorder ([place]) [F14.14] 08/14/2015  . Gout [M10.9] 07/10/2015  . HIV disease ([place]) [B20] 07/10/2015  . Cough [R05] 04/26/2015  . Lactic acidosis [E87.2] 03/06/2015  . CKD (chronic kidney disease) stage 3, GFR 30-59 ml/min [N18.3] 03/06/2015  . Normocytic anemia [D64.9] 03/06/2015  . Hypoglycemia [E16.2]   . Encounter for general adult medical examination with abnormal findings [Z00.01, R68.89] 02/09/2015  . Cocaine use disorder, severe, dependence ([place]) [F14.20] 12/13/2014  . Substance or medication-induced depressive disorder with onset during withdrawal ([place]) [F19.94] 12/13/2014  . Cervicalgia [M54.2] 06/28/2014  . Lumbar radiculopathy, chronic [M54.16] 06/28/2014  . Asthma, chronic [J45.909] 02/03/2014  . 3-vessel CAD [I25.10] 06/24/2013  . ED (erectile dysfunction) of organic origin [N52.8] 07/07/2012  . Hypertension goal BP (blood pressure) < 140/80 [I10] 04/29/2012  . Chondromalacia of left knee [M94.262] 03/19/2012  . Hyperlipidemia with target LDL less than 100 [E78.5] 02/12/2012  . Fibromyalgia [M79.7] 02/12/2012  . HIV (human immunodeficiency virus infection) ([place]) [Z21] 08/27/2011  . Uncontrolled type 2 diabetes with neuropathy ([place]) [E11.40, E11.65] 10/17/2000    Total Time spent with patient: 45  minutes  Subjective:  Michael [name] is a 64 y.o. male patient admitted to [facility] Observation Unit for substance abuse and depression. Pt seen and chart reviewed. Pt is alert/oriented x4, calm, cooperative, yet very depressed in his presentation. Pt reports that he has been depressed with his chronic health issues and living at the [facility]. Today, pt denies suicidal ideation, which he may be minimizing from his prior stated suicidal ideation in the [facility]. However, he has also been intoxicated on crack-cocaine and this may be a transient exacerbation of his chronic MDD secondary to cocaine-induced mood disorder. Therefore, we will continue to observe him in the [facility] OBS Unit for probable discharge in the AM should he continue to make genuine progress. Nursing staff will be diligent in their documentation of objective findings to discern potential inconsistencies between behaviors and statements regarding suicidal intent to better determine safety for discharge planning. In the meantime, social work is assisting the pt in referrals for outpatient psychiatry and counseling services.   HPI: I have reviewed and concur with ED HPI, modified as below: Michael [name] is an 64 y.o. male. Pt presents voluntarily to [place] family friend. He endorses chronic SI but states his SI has increased over the past 5 days during his relapse on crack cocaine. Pt is cooperative and oriented x 4. He is soft spoken and uses a four point cane. Pt reports he can do all his ADLs but avoids stairs if possible. He says he is staying in [name] currently. Pt sts he thinks about walking into traffic. He says he hears voices that tell him to hurt himself. Pt denies hx of suicide attempts other than the 5 day crack binge. Pt says, "I just don't want to live anymore." Writer ran pt by Darleene Cleaver MD and Waylan Boga DNP who recommends pt go to OBS  unit. Pt reports he goes to [facility] for back and neck pain. He says he was  given pain killers after presenting to [facility] recently. Pt says the pain killers triggered him and he started a 5 day crack binge. Pt sts he had been clean from crack for 2 mos prior to five days ago. He reports he used approx $1200 crack in past 5 days. He says he isn't currently seeing a psychiatrist or a therapist. He endorses insomnia, tearfulness, isolating, fatigue, worthlessness. He endorses depressed mood. Writer ran pt by Dr Darleene Cleaver and Waylan Boga DNP who recommends pt go to OBS.   Pt spent the night in the [facility] ED without incident. However, he continues to present with suicidal ideation and is transferred to the [place].   Past Psychiatric History: Cocaine abuse  Risk to Self: Is patient at risk for suicide?: No Risk to Others:   Prior Inpatient Therapy:   Prior Outpatient Therapy:    Past Medical History:  Past Medical History  Diagnosis Date  . Diabetes mellitus     Diagnosed in 2002, started insulin in 2012  . Hypertension   . Headache(784.0)     CT head 08/2011: Periventricular and subcortical white matter hypodensities are most in keeping with chronic microangiopathic change  . Gout   . Hyperlipidemia   . HIV infection [facility]) Nov 2012    Followed by [name]  . Crack cocaine use     for 20+ years, has been enrolled in detox programs in the past  . Chondromalacia of medial femoral condyle     Left knee MRI 04/28/12: Chondromalacia of the medial femoral condyle with slight peripheral degeneration of the meniscocapsular junction of the medial meniscus; followed by sports medicine  . Asthma     No PFTs, history of childhood asthma  . Depression     with history of hospitalization for suicidal ideation  . CAD (coronary artery disease)     a. 06/2013 STEMI/PCI ([facility]): LAD w/ thrombus (treated with BMS), mid 75%, D2 75%; LCX OM2 75%; RCA small, PDA 95%, PLV 95%; b. 10/2013 Cath/PCI: ISR w/in LAD (Promus DES x  2), borderline OM2 lesion; c. 01/2014 MV: Intermediate risk, medium-sized distal ant wall infarct w/ very small amt of peri-infarct ischemia. EF 60%.  . Gout 04/28/2012  . Collagen vascular disease ([place])   . Cellulitis 04/2014    left facial    Past Surgical History  Procedure Laterality Date  . Bowel resection    . Back surgery      1988  . Cardiac surgery    . Cervical spine surgery      " rods in my neck "  . Coronary stent placement    . Nm myocar perf wall motion  12/27/2011    normal  . Spine surgery     Family History:  Family History  Problem Relation Age of Onset  . Diabetes Mother   . Hypertension Mother   . Hyperlipidemia Mother   . Diabetes Father   . Cancer Father   . Hypertension Father   . Diabetes Brother   . Heart disease Brother   . Colon cancer Neg Hx   . Diabetes Sister    Family Psychiatric History: Substance abuse Social History:  History  Alcohol Use No    History  Drug Use  . Yes  . Special: "Crack" cocaine, Cocaine    Comment: last used crack 08/2015.     Social History  Social History  . Marital Status: Divorced    Spouse Name: N/A  . Number of Children: N/A  . Years of Education: N/A   Social History Main Topics  . Smoking status: Never Smoker   . Smokeless tobacco: Never Used  . Alcohol Use: No  . Drug Use: Yes    Special: "Crack" cocaine, Cocaine     Comment: last used crack 08/2015.   Marland Kitchen Sexual Activity: Yes     Comment: accepted condoms   Other Topics Concern  . None   Social History Narrative   Currently staying with a friend in [place]. Was staying @ local motel until a few days ago - left b/c of bed bugs.   Additional Social History:   Pain Medications: denies abusing Prescriptions: denies abusing Over the Counter: denies abusing History of  alcohol / drug use?: Yes Negative Consequences of Use: Personal relationships, Financial Name of Substance 1: cocaine 1 - Age of First Use: 41 1 - Amount (size/oz): varies - 1 - Frequency: daily 1 - Duration: used approx $1200 for past five days 1 - Last Use / Amount: 09/09/15 - am                   Allergies: No Known Allergies  Labs:   Lab Results Last 48 Hours    Results for orders placed or performed during the hospital encounter of 09/09/15 (from the past 48 hour(s))  Glucose, capillary Status: Abnormal   Collection Time: 09/09/15 4:52 PM  Result Value Ref Range   Glucose-Capillary 298 (H) 65 - 99 mg/dL      Current Facility-Administered Medications  Medication Dose Route Frequency Provider Last Rate Last Dose  . acetaminophen (TYLENOL) tablet 650 mg 650 mg Oral Q6H PRN Patrecia Pour, NP  650 mg at 09/09/15 1550  . albuterol (PROVENTIL HFA;VENTOLIN HFA) 108 (90 BASE) MCG/ACT inhaler 1-2 puff 1-2 puff Inhalation Q4H PRN Patrecia Pour, NP    . Derrill Memo ON 09/10/2015] allopurinol (ZYLOPRIM) tablet 200 mg 200 mg Oral Daily Patrecia Pour, NP    . alum & mag hydroxide-simeth (MAALOX/MYLANTA) 200-200-20 MG/5ML suspension 30 mL 30 mL Oral Q4H PRN Patrecia Pour, NP    . Derrill Memo ON 09/10/2015] aspirin EC tablet 81 mg 81 mg Oral Daily Patrecia Pour, NP    . Derrill Memo ON 09/10/2015] clopidogrel (PLAVIX) tablet 75 mg 75 mg Oral Daily Patrecia Pour, NP    . cyclobenzaprine (FLEXERIL) tablet 10 mg 10 mg Oral BID PRN Patrecia Pour, NP  10 mg at 09/09/15 1549  . [START ON 09/10/2015] Emtricitab-Rilpivir-Tenofov DF 200-25-300 MG TABS 1 tablet 1 tablet Oral Q breakfast Patrecia Pour, NP    . Derrill Memo ON 09/10/2015] famotidine (PEPCID) tablet 20 mg 20 mg Oral Daily Patrecia Pour, NP    . insulin glargine (LANTUS) injection 15 Units 15 Units Subcutaneous QHS Patrecia Pour, NP    . Derrill Memo ON 09/10/2015] isosorbide mononitrate (IMDUR) 24 hr tablet 30 mg 30 mg Oral Daily Patrecia Pour, NP    . magnesium hydroxide (MILK OF MAGNESIA) suspension 30 mL 30 mL Oral Daily PRN Patrecia Pour, NP    . metFORMIN (GLUCOPHAGE) tablet 1,000 mg 1,000 mg Oral BID WC Patrecia Pour, NP  1,000 mg at 09/09/15 1656  . naproxen (NAPROSYN) tablet 500 mg 500 mg Oral BID Patrecia Pour, NP  500 mg at 09/09/15 1820  . [START ON 09/10/2015] polyethylene glycol (MIRALAX /  GLYCOLAX) packet 17 g 17 g Oral Daily Patrecia Pour, NP    . Derrill Memo ON 09/10/2015] pravastatin (PRAVACHOL) tablet 40 mg 40 mg Oral Daily Patrecia Pour, NP      Musculoskeletal: Strength & Muscle Tone: within normal limits Gait & Station: normal Patient leans: N/A  Psychiatric Specialty Exam: Review of Systems  Constitutional: Negative.  HENT: Negative.  Eyes: Negative.  Respiratory: Negative.  Cardiovascular: Negative.  Gastrointestinal: Negative.  Genitourinary: Negative.  Musculoskeletal: Negative.  Skin: Negative.  Neurological: Negative.  Endo/Heme/Allergies: Negative.  Psychiatric/Behavioral: Positive for depression and substance abuse. Negative for suicidal ideas. The patient is nervous/anxious and has insomnia.    Blood pressure 150/77, pulse 71, temperature 97.6 F (36.4 C), temperature source Oral, resp. rate 20, height 5' 7.5" (1.715 m), weight 79.833 kg (176 lb), SpO2 100 %.Body mass index is 27.14 kg/(m^2).  General Appearance: Casual  Eye Contact:: Good  Speech: Normal Rate  Volume: Normal  Mood: Depressed, mild  Affect: Blunt  Thought Process: Coherent  Orientation: Full (Time, Place, and Person)  Thought Content: WDL  Suicidal Thoughts: No, no denies, reports it has been related to his pain  Homicidal Thoughts: No  Memory: Immediate; Fair Recent; Fair Remote; Fair   Judgement: Fair  Insight: Fair  Psychomotor Activity: Decreased  Concentration: Fair  Recall: [employer] of Knowledge:Fair  Language: Fair  Akathisia: No  Handed: Right  AIMS (if indicated):    Assets: Leisure Time Resilience  ADL's: Intact  Cognition: WNL  Sleep:     Treatment Plan Summary: Daily contact with patient to assess and evaluate symptoms and progress in treatment, Medication management and Plan cocaine abuse with cocaine induced mood disorder  -Crisis stabilization -Medication management:  -Cymbalta 30 mg daily for depression,  -Trazodone 50mg  qhs insomnia -Individual and substance abuse counseling -Continue home meds as appropriate -continue Lantus 15 units qhs -Start AC/HS blood sugar CBG  Disposition: Spend the night in [facility] OBS Unit for safety, stabilization, allocation of outpatient referrals and resources, and proper evaluation of suicidal risk acuity (transient vs. Chronic vs. Emergent/persistent  Benjamine Mola, FNP-BC 09/09/2015 6:42 PM

## 2015-09-10 DIAGNOSIS — N183 Chronic kidney disease, stage 3 (moderate): Secondary | ICD-10-CM | POA: Diagnosis not present

## 2015-09-10 DIAGNOSIS — M109 Gout, unspecified: Secondary | ICD-10-CM | POA: Diagnosis not present

## 2015-09-10 DIAGNOSIS — G8929 Other chronic pain: Secondary | ICD-10-CM | POA: Diagnosis not present

## 2015-09-10 DIAGNOSIS — N528 Other male erectile dysfunction: Secondary | ICD-10-CM | POA: Diagnosis not present

## 2015-09-10 DIAGNOSIS — I2510 Atherosclerotic heart disease of native coronary artery without angina pectoris: Secondary | ICD-10-CM | POA: Diagnosis not present

## 2015-09-10 DIAGNOSIS — M542 Cervicalgia: Secondary | ICD-10-CM | POA: Diagnosis not present

## 2015-09-10 DIAGNOSIS — M797 Fibromyalgia: Secondary | ICD-10-CM | POA: Diagnosis not present

## 2015-09-10 DIAGNOSIS — F1494 Cocaine use, unspecified with cocaine-induced mood disorder: Secondary | ICD-10-CM | POA: Diagnosis not present

## 2015-09-10 DIAGNOSIS — F1414 Cocaine abuse with cocaine-induced mood disorder: Secondary | ICD-10-CM | POA: Diagnosis not present

## 2015-09-10 DIAGNOSIS — M5416 Radiculopathy, lumbar region: Secondary | ICD-10-CM | POA: Diagnosis not present

## 2015-09-10 DIAGNOSIS — E1122 Type 2 diabetes mellitus with diabetic chronic kidney disease: Secondary | ICD-10-CM | POA: Diagnosis not present

## 2015-09-10 DIAGNOSIS — D649 Anemia, unspecified: Secondary | ICD-10-CM | POA: Diagnosis not present

## 2015-09-10 DIAGNOSIS — E785 Hyperlipidemia, unspecified: Secondary | ICD-10-CM | POA: Diagnosis not present

## 2015-09-10 DIAGNOSIS — E1140 Type 2 diabetes mellitus with diabetic neuropathy, unspecified: Secondary | ICD-10-CM | POA: Diagnosis not present

## 2015-09-10 DIAGNOSIS — F332 Major depressive disorder, recurrent severe without psychotic features: Secondary | ICD-10-CM | POA: Diagnosis not present

## 2015-09-10 DIAGNOSIS — I129 Hypertensive chronic kidney disease with stage 1 through stage 4 chronic kidney disease, or unspecified chronic kidney disease: Secondary | ICD-10-CM | POA: Diagnosis not present

## 2015-09-10 DIAGNOSIS — M549 Dorsalgia, unspecified: Secondary | ICD-10-CM | POA: Diagnosis not present

## 2015-09-10 DIAGNOSIS — G4700 Insomnia, unspecified: Secondary | ICD-10-CM | POA: Diagnosis not present

## 2015-09-10 DIAGNOSIS — Z21 Asymptomatic human immunodeficiency virus [HIV] infection status: Secondary | ICD-10-CM | POA: Diagnosis not present

## 2015-09-10 LAB — GLUCOSE, CAPILLARY: Glucose-Capillary: 177 mg/dL — ABNORMAL HIGH (ref 65–99)

## 2015-09-10 MED ORDER — CLOPIDOGREL BISULFATE 75 MG PO TABS: 75.0000 mg | ORAL_TABLET | Freq: Every day | ORAL | Status: DC

## 2015-09-10 MED ORDER — POLYETHYLENE GLYCOL 3350 17 G PO PACK: 17.0000 g | PACK | Freq: Every day | ORAL | Status: DC

## 2015-09-10 MED ORDER — ISOSORBIDE MONONITRATE ER 30 MG PO TB24: ORAL_TABLET | ORAL | Status: DC

## 2015-09-10 MED ORDER — DULOXETINE HCL 30 MG PO CPEP: 30.0000 mg | ORAL_CAPSULE | Freq: Every day | ORAL | Status: DC

## 2015-09-10 MED ORDER — ASPIRIN EC 81 MG PO TBEC: 81.0000 mg | DELAYED_RELEASE_TABLET | Freq: Every day | ORAL | Status: DC

## 2015-09-10 MED ORDER — EMTRICITAB-RILPIVIR-TENOFOV DF 200-25-300 MG PO TABS: 1.0000 | ORAL_TABLET | Freq: Every day | ORAL | Status: DC

## 2015-09-10 MED ORDER — NAPROXEN 500 MG PO TABS: 500.0000 mg | ORAL_TABLET | Freq: Two times a day (BID) | ORAL | Status: DC

## 2015-09-10 MED ORDER — INSULIN GLARGINE 100 UNIT/ML SOLOSTAR PEN: 15.0000 [IU] | PEN_INJECTOR | Freq: Every day | SUBCUTANEOUS | Status: DC

## 2015-09-10 MED ORDER — FAMOTIDINE 20 MG PO TABS: 20.0000 mg | ORAL_TABLET | Freq: Every day | ORAL | Status: DC

## 2015-09-10 NOTE — BH Assessment (Signed)
[place] Assessment Progress Note   Spoke with patient who says he is not where he needs to be, but he is just "going to have to do what I have to do."  Pt says he started using again after he was given pain medication for his back in the ED, but states he would like to be sober.  Client does not want to return to [facility], but is willing to do so.  Counselor met with pt to review local housing options and resources.  Counselor explained that patient will most likely have to return to the [facility] or else choose another night shelter that is less private.  But, there are some housing options he may be eligible for that will take longer to arrange. Client was given information for the apartments through [employer] and a program through the [facility].  Pt was also given information for [employer] and the [employer] center downtown.  Client has been under constant supervision in the observation unit.

## 2015-09-10 NOTE — Discharge Summary (Signed)
[facility] OBS UNIT H&P   Patient Identification: MABRY KHALIL MRN: [id number] Principal Diagnosis: MDD (major depressive disorder), recurrent severe, without psychosis ([place]) Diagnosis:  Patient Active Problem List   Diagnosis Date Noted  . MDD (major depressive disorder), recurrent severe, without psychosis ([place]) [F33.2] 09/09/2015    Priority: High  . Cocaine-induced mood disorder ([place]) [F14.94] 08/14/2015    Priority: Medium  . Cocaine dependence with cocaine-induced mood disorder ([place]) [F14.24]   . Cocaine dependence ([place]) [F14.20] 08/14/2015  . Cocaine abuse with cocaine-induced mood disorder ([place]) [F14.14] 08/14/2015  . Gout [M10.9] 07/10/2015  . HIV disease ([place]) [B20] 07/10/2015  . Cough [R05] 04/26/2015  . Lactic acidosis [E87.2] 03/06/2015  . CKD (chronic kidney disease) stage 3, GFR 30-59 ml/min [N18.3] 03/06/2015  . Normocytic anemia [D64.9] 03/06/2015  . Hypoglycemia [E16.2]   . Encounter for general adult medical examination with abnormal findings [Z00.01, R68.89] 02/09/2015  . Cocaine use disorder, severe, dependence ([place]) [F14.20] 12/13/2014  . Substance or medication-induced depressive disorder with onset during withdrawal ([place]) [F19.94] 12/13/2014  . Cervicalgia [M54.2] 06/28/2014  . Lumbar radiculopathy, chronic [M54.16] 06/28/2014  . Asthma, chronic [J45.909] 02/03/2014  . 3-vessel CAD [I25.10] 06/24/2013  . ED (erectile dysfunction) of organic origin [N52.8] 07/07/2012  . Hypertension goal BP (blood pressure) < 140/80 [I10] 04/29/2012  . Chondromalacia of left knee [M94.262] 03/19/2012  . Hyperlipidemia with target LDL less than 100 [E78.5] 02/12/2012  . Fibromyalgia [M79.7] 02/12/2012  . HIV (human immunodeficiency virus infection) ([place]) [Z21] 08/27/2011  . Uncontrolled type 2 diabetes with neuropathy ([place]) [E11.40, E11.65] 10/17/2000    Total Time spent with patient: 45  minutes  Subjective:  COVIN GALMORE is a 64 y.o. male patient admitted to [facility] Observation Unit for substance abuse and depression. Pt seen and chart reviewed. Pt is alert/oriented x4, calm, cooperative, yet very depressed in his presentation. Pt reports that he has been depressed with his chronic health issues and living at the [facility]. Today, pt denies suicidal ideation, which he may be minimizing from his prior stated suicidal ideation in the [facility]. However, he has also been intoxicated on crack-cocaine and this may be a transient exacerbation of his chronic MDD secondary to cocaine-induced mood disorder. Therefore, we will continue to observe him in the [facility] OBS Unit for probable discharge in the AM should he continue to make genuine progress. Nursing staff will be diligent in their documentation of objective findings to discern potential inconsistencies between behaviors and statements regarding suicidal intent to better determine safety for discharge  planning. In the meantime, social work is assisting the pt in referrals for outpatient psychiatry and counseling services.   Update: 09/10/15. Pt spent the night in the [facility] OBS Unit without incident. He continues to be alert/oriented x4, calm, cooperative and appropriate to situation. Pt states that he feels much better, slept well, and is content with returning to the [facility] after review other options which seem far less desirable than his current situation. Pt was receptive to outpatient resources given to him. Contracts for safety, denies suicidal ideation and objective nursing data support lack of self-injurious behaviors or gestures during his stay.   HPI: I have reviewed and concur with ED HPI, modified as below: CALLIE QUIROS is an 64 y.o. male. Pt presents voluntarily to [place] family friend. He endorses chronic SI but states his SI has increased over the past 5 days during his relapse on crack cocaine. Pt is cooperative and  oriented x 4.  He is soft spoken and uses a four point cane. Pt reports he can do all his ADLs but avoids stairs if possible. He says he is staying in [name] currently. Pt sts he thinks about walking into traffic. He says he hears voices that tell him to hurt himself. Pt denies hx of suicide attempts other than the 5 day crack binge. Pt says, "I just don't want to live anymore." Writer ran pt by Darleene Cleaver MD and Waylan Boga DNP who recommends pt go to OBS unit. Pt reports he goes to [facility] for back and neck pain. He says he was given pain killers after presenting to [facility] recently. Pt says the pain killers triggered him and he started a 5 day crack binge. Pt sts he had been clean from crack for 2 mos prior to five days ago. He reports he used approx $1200 crack in past 5 days. He says he isn't currently seeing a psychiatrist or a therapist. He endorses insomnia, tearfulness, isolating, fatigue, worthlessness. He endorses depressed mood. Writer ran pt by Dr Darleene Cleaver and Waylan Boga DNP who recommends pt go to OBS.   Pt spent the night in the [facility] ED without incident. However, he continues to present with suicidal ideation and is transferred to the [place].   Past Psychiatric History: Cocaine abuse  Risk to Self: Is patient at risk for suicide?: No Risk to Others:   Prior Inpatient Therapy:   Prior Outpatient Therapy:    Past Medical History:  Past Medical History  Diagnosis Date  . Diabetes mellitus     Diagnosed in 2002, started insulin in 2012  . Hypertension   . Headache(784.0)     CT head 08/2011: Periventricular and subcortical white matter hypodensities are most in keeping with chronic microangiopathic change  . Gout   . Hyperlipidemia   . HIV infection [facility]) Nov 2012    Followed by [name]  . Crack cocaine use     for 20+ years, has been enrolled in detox programs in the past  . Chondromalacia of medial femoral condyle      Left knee MRI 04/28/12: Chondromalacia of the medial femoral condyle with slight peripheral degeneration of the meniscocapsular junction of the medial meniscus; followed by sports medicine  . Asthma     No PFTs, history of childhood asthma  . Depression     with history of hospitalization for suicidal ideation  . CAD (coronary artery disease)     a. 06/2013 STEMI/PCI ([facility]): LAD w/ thrombus (treated with BMS), mid 75%, D2 75%; LCX OM2 75%; RCA small, PDA 95%, PLV 95%; b. 10/2013 Cath/PCI: ISR w/in LAD (Promus DES x 2), borderline OM2 lesion; c. 01/2014 MV: Intermediate risk, medium-sized distal ant wall infarct w/ very small amt of peri-infarct ischemia. EF 60%.  . Gout 04/28/2012  . Collagen vascular disease ([place])   . Cellulitis 04/2014    left facial    Past Surgical History  Procedure Laterality Date  . Bowel resection    . Back surgery      1988  . Cardiac surgery    . Cervical spine surgery      " rods in my neck "  . Coronary stent placement    . Nm myocar perf wall motion  12/27/2011    normal  . Spine surgery     Family History:  Family History  Problem Relation Age of Onset  . Diabetes Mother   . Hypertension Mother   .  Hyperlipidemia Mother   . Diabetes Father   . Cancer Father   . Hypertension Father   . Diabetes Brother   . Heart disease Brother   . Colon cancer Neg Hx   . Diabetes Sister    Family Psychiatric History: Substance abuse Social History:  History  Alcohol Use No    History  Drug Use  . Yes  . Special: "Crack" cocaine, Cocaine    Comment: last used crack 08/2015.     Social History   Social History  . Marital Status: Divorced    Spouse Name: N/A  . Number of Children: N/A  . Years of Education: N/A   Social History Main Topics  . Smoking status: Never Smoker   . Smokeless  tobacco: Never Used  . Alcohol Use: No  . Drug Use: Yes    Special: "Crack" cocaine, Cocaine     Comment: last used crack 08/2015.   Marland Kitchen Sexual Activity: Yes     Comment: accepted condoms   Other Topics Concern  . None   Social History Narrative   Currently staying with a friend in [place]. Was staying @ local motel until a few days ago - left b/c of bed bugs.   Additional Social History:   Pain Medications: denies abusing Prescriptions: denies abusing Over the Counter: denies abusing History of alcohol / drug use?: Yes Negative Consequences of Use: Personal relationships, Financial Name of Substance 1: cocaine 1 - Age of First Use: 41 1 - Amount (size/oz): varies - 1 - Frequency: daily 1 - Duration: used approx $1200 for past five days 1 - Last Use / Amount: 09/09/15 - am                   Allergies: No Known Allergies  Labs:   Lab Results Last 48 Hours    Results for orders placed or performed during the hospital encounter of 09/09/15 (from the past 48 hour(s))  Glucose, capillary Status: Abnormal   Collection Time: 09/09/15 4:52 PM  Result Value Ref Range   Glucose-Capillary 298 (H) 65 - 99 mg/dL      Current Facility-Administered Medications  Medication Dose Route Frequency Provider Last Rate Last Dose  . acetaminophen (TYLENOL) tablet 650 mg 650 mg Oral Q6H PRN Patrecia Pour, NP  650 mg at 09/09/15 1550  . albuterol (PROVENTIL HFA;VENTOLIN HFA) 108 (90 BASE) MCG/ACT inhaler 1-2 puff 1-2 puff Inhalation Q4H PRN Patrecia Pour, NP    . Derrill Memo ON 09/10/2015] allopurinol (ZYLOPRIM) tablet 200 mg 200 mg Oral Daily Patrecia Pour, NP    . alum & mag hydroxide-simeth (MAALOX/MYLANTA) 200-200-20 MG/5ML suspension 30 mL 30 mL Oral Q4H PRN Patrecia Pour, NP    . Derrill Memo ON 09/10/2015] aspirin EC tablet 81 mg 81 mg Oral Daily Patrecia Pour, NP    . Derrill Memo ON  09/10/2015] clopidogrel (PLAVIX) tablet 75 mg 75 mg Oral Daily Patrecia Pour, NP    . cyclobenzaprine (FLEXERIL) tablet 10 mg 10 mg Oral BID PRN Patrecia Pour, NP  10 mg at 09/09/15 1549  . [START ON 09/10/2015] Emtricitab-Rilpivir-Tenofov DF 200-25-300 MG TABS 1 tablet 1 tablet Oral Q breakfast Patrecia Pour, NP    . Derrill Memo ON 09/10/2015] famotidine (PEPCID) tablet 20 mg 20 mg Oral Daily Patrecia Pour, NP    . insulin glargine (LANTUS) injection 15 Units 15 Units Subcutaneous QHS Patrecia Pour, NP    . Derrill Memo ON 09/10/2015] isosorbide mononitrate (IMDUR)  24 hr tablet 30 mg 30 mg Oral Daily Patrecia Pour, NP    . magnesium hydroxide (MILK OF MAGNESIA) suspension 30 mL 30 mL Oral Daily PRN Patrecia Pour, NP    . metFORMIN (GLUCOPHAGE) tablet 1,000 mg 1,000 mg Oral BID WC Patrecia Pour, NP  1,000 mg at 09/09/15 1656  . naproxen (NAPROSYN) tablet 500 mg 500 mg Oral BID Patrecia Pour, NP  500 mg at 09/09/15 1820  . [START ON 09/10/2015] polyethylene glycol (MIRALAX / GLYCOLAX) packet 17 g 17 g Oral Daily Patrecia Pour, NP    . Derrill Memo ON 09/10/2015] pravastatin (PRAVACHOL) tablet 40 mg 40 mg Oral Daily Patrecia Pour, NP      Musculoskeletal: Strength & Muscle Tone: within normal limits Gait & Station: normal Patient leans: N/A  Psychiatric Specialty Exam: Review of Systems  Constitutional: Negative.  HENT: Negative.  Eyes: Negative.  Respiratory: Negative.  Cardiovascular: Negative.  Gastrointestinal: Negative.  Genitourinary: Negative.  Musculoskeletal: Negative.  Skin: Negative.  Neurological: Negative.  Endo/Heme/Allergies: Negative.  Psychiatric/Behavioral: Positive for depression and substance abuse. Negative for suicidal ideas. The patient is nervous/anxious and has insomnia.    Blood pressure 150/77, pulse 71, temperature 97.6 F (36.4 C),  temperature source Oral, resp. rate 20, height 5' 7.5" (1.715 m), weight 79.833 kg (176 lb), SpO2 100 %.Body mass index is 27.14 kg/(m^2).  General Appearance: Casual  Eye Contact:: Good  Speech: Normal Rate  Volume: Normal  Mood: Depressed, mild  Affect: Blunt  Thought Process: Coherent  Orientation: Full (Time, Place, and Person)  Thought Content: WDL  Suicidal Thoughts: No, no denies, reports it has been related to his pain  Homicidal Thoughts: No  Memory: Immediate; Fair Recent; Fair Remote; Fair  Judgement: Fair  Insight: Fair  Psychomotor Activity: Decreased  Concentration: Fair  Recall: [employer] of Knowledge:Fair  Language: Fair  Akathisia: No  Handed: Right  AIMS (if indicated):    Assets: Leisure Time Resilience  ADL's: Intact  Cognition: WNL  Sleep:     Treatment Plan Summary: Daily contact with patient to assess and evaluate symptoms and progress in treatment, Medication management and Plan cocaine abuse with cocaine induced mood disorder  -Crisis stabilization -Medication management:  -Cymbalta 30 mg daily for depression,  -Trazodone 50mg  qhs insomnia -Individual and substance abuse counseling -Continue home meds as appropriate -continue Lantus 15 units qhs -Start AC/HS blood sugar CBG  *Scripts given for psychiatry medications for 14 days only  Disposition:  -Discharge home with outpatient resources for anxiety/depression/substance abuse  Benjamine Mola, FNP-BC 09/10/2015 1:57 PM

## 2015-09-10 NOTE — BH Assessment (Signed)
[facility] Assessment Progress Note   Pt left the observation unit without his written prescriptions.  Counselor made effort to contact him using the numbers in his chart.  Left message with his sister and with [facility], so that client can contact the unit to notify us whether he needs them or not.

## 2015-09-14 DIAGNOSIS — E162 Hypoglycemia, unspecified: Secondary | ICD-10-CM

## 2015-09-14 DIAGNOSIS — Z5900 Homelessness unspecified: Secondary | ICD-10-CM

## 2015-09-18 LAB — GLUCOSE, POCT (MANUAL RESULT ENTRY): POC Glucose: 88 mg/dl (ref 70–99)

## 2015-09-18 NOTE — Congregational Nurse Program (Signed)
Congregational Nurse Program Note  Date of Encounter: 08/24/2015  Past Medical History: Past Medical History  Diagnosis Date  . Diabetes mellitus     Diagnosed in 2002, started insulin in 2012  . Hypertension   . Headache(784.0)     CT head 08/2011: Periventricular and subcortical white matter hypodensities are most in keeping with chronic microangiopathic change  . Gout   . Hyperlipidemia   . HIV infection [facility]) Nov 2012    Followed by [name]  . Crack cocaine use     for 20+ years, has been enrolled in detox programs in the past  . Chondromalacia of medial femoral condyle     Left knee MRI 04/28/12: Chondromalacia of the medial femoral condyle with slight peripheral degeneration of the meniscocapsular junction of the medial meniscus; followed by sports medicine  . Asthma     No PFTs, history of childhood asthma  . Depression     with history of hospitalization for suicidal ideation  . CAD (coronary artery disease)     a. 06/2013 STEMI/PCI ([facility]): LAD w/ thrombus (treated with BMS), mid 75%, D2 75%; LCX OM2 75%; RCA small, PDA 95%, PLV 95%;  b. 10/2013 Cath/PCI: ISR w/in LAD (Promus DES x 2), borderline OM2 lesion;  c. 01/2014 MV: Intermediate risk, medium-sized distal ant wall infarct w/ very small amt of peri-infarct ischemia. EF 60%.  . Gout 04/28/2012  . Collagen vascular disease ([place])   . Cellulitis 04/2014    left facial    Encounter Details:     CNP Questionnaire - 08/29/15 1659    Patient Demographics   Is this a new or existing patient? Existing   Patient is considered a/an Not Applicable   Patient Assistance   Patient's financial/insurance status Medicaid;Low Income   Patient referred to apply for the following financial assistance Not Applicable   Food insecurities addressed Provided food supplies   Transportation assistance No   Assistance securing medications No   Educational health offerings Not Applicable   Encounter Details   Primary purpose of visit  Chronic Illness/Condition Visit   Was an Emergency Department visit averted? Not Applicable   Does patient have a medical provider? Yes   Patient referred to Not Applicable   Was a mental health screening completed? (GAINS tool) No   Does patient have dental issues? No   Since previous encounter, have you referred patient for abnormal blood pressure that resulted in a new diagnosis or medication change? No   Since previous encounter, have you referred patient for abnormal blood glucose that resulted in a new diagnosis or medication change? No   For Abstraction Use Only   Does patient have insurance? Yes       Clinic visit for B/P check  130/80.  States he has an appointment with [facility] to discuss with Physicians chronic pain management

## 2015-09-18 NOTE — Congregational Nurse Program (Signed)
Congregational Nurse Program Note  Date of Encounter: 09/14/2015  Past Medical History: Past Medical History  Diagnosis Date  . Diabetes mellitus     Diagnosed in 2002, started insulin in 2012  . Hypertension   . Headache(784.0)     CT head 08/2011: Periventricular and subcortical white matter hypodensities are most in keeping with chronic microangiopathic change  . Gout   . Hyperlipidemia   . HIV infection [facility]) Nov 2012    Followed by [name]  . Crack cocaine use     for 20+ years, has been enrolled in detox programs in the past  . Chondromalacia of medial femoral condyle     Left knee MRI 04/28/12: Chondromalacia of the medial femoral condyle with slight peripheral degeneration of the meniscocapsular junction of the medial meniscus; followed by sports medicine  . Asthma     No PFTs, history of childhood asthma  . Depression     with history of hospitalization for suicidal ideation  . CAD (coronary artery disease)     a. 06/2013 STEMI/PCI ([facility]): LAD w/ thrombus (treated with BMS), mid 75%, D2 75%; LCX OM2 75%; RCA small, PDA 95%, PLV 95%;  b. 10/2013 Cath/PCI: ISR w/in LAD (Promus DES x 2), borderline OM2 lesion;  c. 01/2014 MV: Intermediate risk, medium-sized distal ant wall infarct w/ very small amt of peri-infarct ischemia. EF 60%.  . Gout 04/28/2012  . Collagen vascular disease ([place])   . Cellulitis 04/2014    left facial    Encounter Details:     CNP Questionnaire - 09/14/15 1137    Patient Demographics   Is this a new or existing patient? Existing   Patient is considered a/an Not Applicable   Patient Assistance   Patient's financial/insurance status Medicaid;Low Income   Patient referred to apply for the following financial assistance Not Applicable   Food insecurities addressed Provided food supplies   Transportation assistance No   Assistance securing medications No   Educational health offerings Not Applicable   Encounter Details   Primary purpose of visit  Chronic Illness/Condition Visit   Was an Emergency Department visit averted? Not Applicable   Does patient have a medical provider? No   Patient referred to Not Applicable   Was a mental health screening completed? (GAINS tool) No   Does patient have dental issues? No   Since previous encounter, have you referred patient for abnormal blood pressure that resulted in a new diagnosis or medication change? No   Since previous encounter, have you referred patient for abnormal blood glucose that resulted in a new diagnosis or medication change? No   For Abstraction Use Only   Does patient have insurance? Yes     Clinic visit for B/P check and CBG.  States will be returning to [facility] for more "test" to determine pain.

## 2015-09-20 ENCOUNTER — Ambulatory Visit: Payer: Self-pay | Admitting: Infectious Diseases

## 2015-09-21 ENCOUNTER — Emergency Department (HOSPITAL_COMMUNITY): Payer: Medicare Other

## 2015-09-21 ENCOUNTER — Other Ambulatory Visit: Payer: Self-pay

## 2015-09-21 ENCOUNTER — Encounter (HOSPITAL_COMMUNITY): Payer: Self-pay | Admitting: Emergency Medicine

## 2015-09-21 ENCOUNTER — Emergency Department (HOSPITAL_COMMUNITY)
Admission: EM | Admit: 2015-09-21 | Discharge: 2015-09-21 | Disposition: A | Discharge status: Home / Self Care | Payer: Medicare Other | Attending: Emergency Medicine | Admitting: Emergency Medicine

## 2015-09-21 DIAGNOSIS — M109 Gout, unspecified: Secondary | ICD-10-CM | POA: Diagnosis not present

## 2015-09-21 DIAGNOSIS — G8929 Other chronic pain: Secondary | ICD-10-CM | POA: Diagnosis not present

## 2015-09-21 DIAGNOSIS — E785 Hyperlipidemia, unspecified: Secondary | ICD-10-CM | POA: Insufficient documentation

## 2015-09-21 DIAGNOSIS — I2510 Atherosclerotic heart disease of native coronary artery without angina pectoris: Secondary | ICD-10-CM | POA: Diagnosis not present

## 2015-09-21 DIAGNOSIS — F1490 Cocaine use, unspecified, uncomplicated: Secondary | ICD-10-CM

## 2015-09-21 DIAGNOSIS — G629 Polyneuropathy, unspecified: Secondary | ICD-10-CM | POA: Diagnosis not present

## 2015-09-21 DIAGNOSIS — E119 Type 2 diabetes mellitus without complications: Secondary | ICD-10-CM | POA: Diagnosis not present

## 2015-09-21 DIAGNOSIS — R1032 Left lower quadrant pain: Secondary | ICD-10-CM | POA: Insufficient documentation

## 2015-09-21 DIAGNOSIS — Z7982 Long term (current) use of aspirin: Secondary | ICD-10-CM | POA: Insufficient documentation

## 2015-09-21 DIAGNOSIS — R0602 Shortness of breath: Secondary | ICD-10-CM | POA: Diagnosis present

## 2015-09-21 DIAGNOSIS — J45901 Unspecified asthma with (acute) exacerbation: Secondary | ICD-10-CM | POA: Insufficient documentation

## 2015-09-21 DIAGNOSIS — Z791 Long term (current) use of non-steroidal anti-inflammatories (NSAID): Secondary | ICD-10-CM | POA: Insufficient documentation

## 2015-09-21 DIAGNOSIS — I10 Essential (primary) hypertension: Secondary | ICD-10-CM | POA: Insufficient documentation

## 2015-09-21 DIAGNOSIS — N179 Acute kidney failure, unspecified: Secondary | ICD-10-CM | POA: Diagnosis not present

## 2015-09-21 DIAGNOSIS — Z79899 Other long term (current) drug therapy: Secondary | ICD-10-CM | POA: Insufficient documentation

## 2015-09-21 DIAGNOSIS — Z7984 Long term (current) use of oral hypoglycemic drugs: Secondary | ICD-10-CM | POA: Insufficient documentation

## 2015-09-21 DIAGNOSIS — R109 Unspecified abdominal pain: Secondary | ICD-10-CM

## 2015-09-21 DIAGNOSIS — R319 Hematuria, unspecified: Secondary | ICD-10-CM

## 2015-09-21 DIAGNOSIS — B20 Human immunodeficiency virus [HIV] disease: Secondary | ICD-10-CM | POA: Diagnosis not present

## 2015-09-21 DIAGNOSIS — Z872 Personal history of diseases of the skin and subcutaneous tissue: Secondary | ICD-10-CM | POA: Insufficient documentation

## 2015-09-21 DIAGNOSIS — R0789 Other chest pain: Secondary | ICD-10-CM | POA: Diagnosis not present

## 2015-09-21 DIAGNOSIS — J069 Acute upper respiratory infection, unspecified: Secondary | ICD-10-CM | POA: Diagnosis not present

## 2015-09-21 DIAGNOSIS — F329 Major depressive disorder, single episode, unspecified: Secondary | ICD-10-CM | POA: Insufficient documentation

## 2015-09-21 DIAGNOSIS — G5793 Unspecified mononeuropathy of bilateral lower limbs: Secondary | ICD-10-CM

## 2015-09-21 DIAGNOSIS — Z7902 Long term (current) use of antithrombotics/antiplatelets: Secondary | ICD-10-CM | POA: Diagnosis not present

## 2015-09-21 DIAGNOSIS — Z794 Long term (current) use of insulin: Secondary | ICD-10-CM | POA: Diagnosis not present

## 2015-09-21 DIAGNOSIS — R079 Chest pain, unspecified: Secondary | ICD-10-CM

## 2015-09-21 LAB — COMPREHENSIVE METABOLIC PANEL
ALBUMIN: 4.3 g/dL (ref 3.5–5.0)
ALT: 17 U/L (ref 17–63)
AST: 26 U/L (ref 15–41)
Alkaline Phosphatase: 68 U/L (ref 38–126)
Anion gap: 10 (ref 5–15)
BUN: 26 mg/dL — ABNORMAL HIGH (ref 6–20)
CALCIUM: 9.6 mg/dL (ref 8.9–10.3)
CO2: 24 mmol/L (ref 22–32)
CREATININE: 1.82 mg/dL — AB (ref 0.61–1.24)
Chloride: 104 mmol/L (ref 101–111)
GFR calc non Af Amer: 37 mL/min — ABNORMAL LOW (ref >60)
GFR, EST AFRICAN AMERICAN: 43 mL/min — AB (ref >60)
Glucose, Bld: 287 mg/dL — ABNORMAL HIGH (ref 65–99)
Potassium: 4.8 mmol/L (ref 3.5–5.1)
SODIUM: 138 mmol/L (ref 135–145)
Total Bilirubin: 0.6 mg/dL (ref 0.3–1.2)
Total Protein: 7.7 g/dL (ref 6.5–8.1)

## 2015-09-21 LAB — URINE MICROSCOPIC-ADD ON

## 2015-09-21 LAB — CBC
HCT: 36.2 % — ABNORMAL LOW (ref 39.0–52.0)
Hemoglobin: 11.7 g/dL — ABNORMAL LOW (ref 13.0–17.0)
MCH: 25.8 pg — AB (ref 26.0–34.0)
MCHC: 32.3 g/dL (ref 30.0–36.0)
MCV: 79.9 fL (ref 78.0–100.0)
PLATELETS: 197 10*3/uL (ref 150–400)
RBC: 4.53 MIL/uL (ref 4.22–5.81)
RDW: 14.2 % (ref 11.5–15.5)
WBC: 6.2 10*3/uL (ref 4.0–10.5)

## 2015-09-21 LAB — RAPID URINE DRUG SCREEN, HOSP PERFORMED
AMPHETAMINES: NOT DETECTED
BENZODIAZEPINES: NOT DETECTED
Barbiturates: NOT DETECTED
COCAINE: POSITIVE — AB
OPIATES: NOT DETECTED
TETRAHYDROCANNABINOL: NOT DETECTED

## 2015-09-21 LAB — URINALYSIS, ROUTINE W REFLEX MICROSCOPIC
BILIRUBIN URINE: NEGATIVE
Glucose, UA: >1000 mg/dL — AB
KETONES UR: 15 mg/dL — AB
Leukocytes, UA: NEGATIVE
Nitrite: NEGATIVE
PROTEIN: 30 mg/dL — AB
Specific Gravity, Urine: 1.029 (ref 1.005–1.030)
pH: 5 (ref 5.0–8.0)

## 2015-09-21 LAB — LIPASE, BLOOD: Lipase: 39 U/L (ref 11–51)

## 2015-09-21 LAB — I-STAT TROPONIN, ED: Troponin i, poc: 0.02 ng/mL (ref 0.00–0.08)

## 2015-09-21 MED ORDER — BARIUM SULFATE 2.1 % PO SUSP
ORAL | Status: AC
  Administered 2015-09-21: 1 mL
  Filled 2015-09-21: qty 2

## 2015-09-21 MED ORDER — SODIUM CHLORIDE 0.9 % IV BOLUS (SEPSIS)
1000.0000 mL | Freq: Once | INTRAVENOUS | Status: AC
  Administered 2015-09-21: 1000 mL via INTRAVENOUS

## 2015-09-21 MED ORDER — FENTANYL CITRATE (PF) 100 MCG/2ML IJ SOLN
50.0000 ug | Freq: Once | INTRAMUSCULAR | Status: AC
  Administered 2015-09-21: 50 ug via INTRAVENOUS
  Filled 2015-09-21: qty 2

## 2015-09-21 NOTE — ED Notes (Signed)
Pt. presents with multiple complaints : reports LLQ pain for 2 weeks , denies nausea or vomitting / no diarrhea , pt. also reported SOB with productive cough onset this week . Denies fever or chills , pt. added headache , neck pain and bilateral shoulder pain .

## 2015-09-21 NOTE — Discharge Instructions (Signed)
Abdominal Pain, Adult Many things can cause abdominal pain. Usually, abdominal pain is not caused by a disease and will improve without treatment. It can often be observed and treated at home. Your health care provider will do a physical exam and possibly order blood tests and X-rays to help determine the seriousness of your pain. However, in many cases, more time must pass before a clear cause of the pain can be found. Before that point, your health care provider may not know if you need more testing or further treatment. HOME CARE INSTRUCTIONS Monitor your abdominal pain for any changes. The following actions may help to alleviate any discomfort you are experiencing:  Only take over-the-counter or prescription medicines as directed by your health care provider.  Do not take laxatives unless directed to do so by your health care provider.  Try a clear liquid diet (broth, tea, or water) as directed by your health care provider. Slowly move to a bland diet as tolerated. SEEK MEDICAL CARE IF:  You have unexplained abdominal pain.  You have abdominal pain associated with nausea or diarrhea.  You have pain when you urinate or have a bowel movement.  You experience abdominal pain that wakes you in the night.  You have abdominal pain that is worsened or improved by eating food.  You have abdominal pain that is worsened with eating fatty foods.  You have a fever. SEEK IMMEDIATE MEDICAL CARE IF:  Your pain does not go away within 2 hours.  You keep throwing up (vomiting).  Your pain is felt only in portions of the abdomen, such as the right side or the left lower portion of the abdomen.  You pass bloody or black tarry stools. MAKE SURE YOU:  Understand these instructions.  Will watch your condition.  Will get help right away if you are not doing well or get worse.   This information is not intended to replace advice given to you by your health care provider. Make sure you discuss  any questions you have with your health care provider.   Document Released: 07/03/2005 Document Revised: 06/14/2015 Document Reviewed: 06/02/2013 Elsevier Interactive Patient Education 2016 [employer]  Hematuria, Adult Hematuria is blood in your urine. It can be caused by a bladder infection, kidney infection, prostate infection, kidney stone, or cancer of your urinary tract. Infections can usually be treated with medicine, and a kidney stone usually will pass through your urine. If neither of these is the cause of your hematuria, further workup to find out the reason may be needed. It is very important that you tell your health care provider about any blood you see in your urine, even if the blood stops without treatment or happens without causing pain. Blood in your urine that happens and then stops and then happens again can be a symptom of a very serious condition. Also, pain is not a symptom in the initial stages of many urinary cancers. HOME CARE INSTRUCTIONS   Drink lots of fluid, 3-4 quarts a day. If you have been diagnosed with an infection, cranberry juice is especially recommended, in addition to large amounts of water.  Avoid caffeine, tea, and carbonated beverages because they tend to irritate the bladder.  Avoid alcohol because it may irritate the prostate.  Take all medicines as directed by your health care provider.  If you were prescribed an antibiotic medicine, finish it all even if you start to feel better.  If you have been diagnosed with a kidney stone, follow  your health care provider's instructions regarding straining your urine to catch the stone.  Empty your bladder often. Avoid holding urine for long periods of time.  After a bowel movement, women should cleanse front to back. Use each tissue only once.  Empty your bladder before and after sexual intercourse if you are a male. SEEK MEDICAL CARE IF:  You develop back pain.  You have a fever.  You have a  feeling of sickness in your stomach (nausea) or vomiting.  Your symptoms are not better in 3 days. Return sooner if you are getting worse. SEEK IMMEDIATE MEDICAL CARE IF:   You develop severe vomiting and are unable to keep the medicine down.  You develop severe back or abdominal pain despite taking your medicines.  You begin passing a large amount of blood or clots in your urine.  You feel extremely weak or faint, or you pass out. MAKE SURE YOU:   Understand these instructions.  Will watch your condition.  Will get help right away if you are not doing well or get worse.   This information is not intended to replace advice given to you by your health care provider. Make sure you discuss any questions you have with your health care provider.   Document Released: 09/23/2005 Document Revised: 10/14/2014 Document Reviewed: 05/24/2013 Elsevier Interactive Patient Education 2016 [employer]  Nonspecific Chest Pain  Chest pain can be caused by many different conditions. There is always a chance that your pain could be related to something serious, such as a heart attack or a blood clot in your lungs. Chest pain can also be caused by conditions that are not life-threatening. If you have chest pain, it is very important to follow up with your health care provider. CAUSES  Chest pain can be caused by:  Heartburn.  Pneumonia or bronchitis.  Anxiety or stress.  Inflammation around your heart (pericarditis) or lung (pleuritis or pleurisy).  A blood clot in your lung.  A collapsed lung (pneumothorax). It can develop suddenly on its own (spontaneous pneumothorax) or from trauma to the chest.  Shingles infection (varicella-zoster virus).  Heart attack.  Damage to the bones, muscles, and cartilage that make up your chest wall. This can include:  Bruised bones due to injury.  Strained muscles or cartilage due to frequent or repeated coughing or overwork.  Fracture to one or more  ribs.  Sore cartilage due to inflammation (costochondritis). RISK FACTORS  Risk factors for chest pain may include:  Activities that increase your risk for trauma or injury to your chest.  Respiratory infections or conditions that cause frequent coughing.  Medical conditions or overeating that can cause heartburn.  Heart disease or family history of heart disease.  Conditions or health behaviors that increase your risk of developing a blood clot.  Having had chicken pox (varicella zoster). SIGNS AND SYMPTOMS Chest pain can feel like:  Burning or tingling on the surface of your chest or deep in your chest.  Crushing, pressure, aching, or squeezing pain.  Dull or sharp pain that is worse when you move, cough, or take a deep breath.  Pain that is also felt in your back, neck, shoulder, or arm, or pain that spreads to any of these areas. Your chest pain may come and go, or it may stay constant. DIAGNOSIS Lab tests or other studies may be needed to find the cause of your pain. Your health care provider may have you take a test called an ambulatory ECG (electrocardiogram).  An ECG records your heartbeat patterns at the time the test is performed. You may also have other tests, such as:  Transthoracic echocardiogram (TTE). During echocardiography, sound waves are used to create a picture of all of the heart structures and to look at how blood flows through your heart.  Transesophageal echocardiogram (TEE).This is a more advanced imaging test that obtains images from inside your body. It allows your health care provider to see your heart in finer detail.  Cardiac monitoring. This allows your health care provider to monitor your heart rate and rhythm in real time.  Holter monitor. This is a portable device that records your heartbeat and can help to diagnose abnormal heartbeats. It allows your health care provider to track your heart activity for several days, if needed.  Stress tests.  These can be done through exercise or by taking medicine that makes your heart beat more quickly.  Blood tests.  Imaging tests. TREATMENT  Your treatment depends on what is causing your chest pain. Treatment may include:  Medicines. These may include:  Acid blockers for heartburn.  Anti-inflammatory medicine.  Pain medicine for inflammatory conditions.  Antibiotic medicine, if an infection is present.  Medicines to dissolve blood clots.  Medicines to treat coronary artery disease.  Supportive care for conditions that do not require medicines. This may include:  Resting.  Applying heat or cold packs to injured areas.  Limiting activities until pain decreases. HOME CARE INSTRUCTIONS  If you were prescribed an antibiotic medicine, finish it all even if you start to feel better.  Avoid any activities that bring on chest pain.  Do not use any tobacco products, including cigarettes, chewing tobacco, or electronic cigarettes. If you need help quitting, ask your health care provider.  Do not drink alcohol.  Take medicines only as directed by your health care provider.  Keep all follow-up visits as directed by your health care provider. This is important. This includes any further testing if your chest pain does not go away.  If heartburn is the cause for your chest pain, you may be told to keep your head raised (elevated) while sleeping. This reduces the chance that acid will go from your stomach into your esophagus.  Make lifestyle changes as directed by your health care provider. These may include:  Getting regular exercise. Ask your health care provider to suggest some activities that are safe for you.  Eating a heart-healthy diet. A registered dietitian can help you to learn healthy eating options.  Maintaining a healthy weight.  Managing diabetes, if necessary.  Reducing stress. SEEK MEDICAL CARE IF:  Your chest pain does not go away after treatment.  You have  a rash with blisters on your chest.  You have a fever. SEEK IMMEDIATE MEDICAL CARE IF:   Your chest pain is worse.  You have an increasing cough, or you cough up blood.  You have severe abdominal pain.  You have severe weakness.  You faint.  You have chills.  You have sudden, unexplained chest discomfort.  You have sudden, unexplained discomfort in your arms, back, neck, or jaw.  You have shortness of breath at any time.  You suddenly start to sweat, or your skin gets clammy.  You feel nauseous or you vomit.  You suddenly feel light-headed or dizzy.  Your heart begins to beat quickly, or it feels like it is skipping beats. These symptoms may represent a serious problem that is an emergency. Do not wait to see if the  symptoms will go away. Get medical help right away. Call your local emergency services (911 in the [place] Do not drive yourself to the hospital.   This information is not intended to replace advice given to you by your health care provider. Make sure you discuss any questions you have with your health care provider.   Document Released: 07/03/2005 Document Revised: 10/14/2014 Document Reviewed: 04/29/2014 Elsevier Interactive Patient Education 2016 [place].  Stimulant Use Disorder-Cocaine Cocaine is one of a group of powerful drugs called stimulants. Cocaine has medical uses for stopping nosebleeds and for pain control before minor nose or dental surgery. However, cocaine is misused because of the effects that it produces. These effects include:   A feeling of extreme pleasure.  Alertness.  High energy. Common street names for cocaine include coke, crack, blow, snow, and nose candy. Cocaine is snorted, dissolved in water and injected, or smoked.  Stimulants are addictive because they activate regions of the brain that produce both the pleasurable sensation of "reward" and psychological dependence. Together, these actions account for loss of control and  the rapid development of drug dependence. This means you become ill without the drug (withdrawal) and need to keep using it to function.  Stimulant use disorder is use of stimulants that disrupts your daily life. It disrupts relationships with family and friends and how you do your job. Cocaine increases your blood pressure and heart rate. It can cause a heart attack or stroke. Cocaine can also cause death from irregular heart rate or seizures. SYMPTOMS Symptoms of stimulant use disorder with cocaine include:  Use of cocaine in larger amounts or over a longer period of time than intended.  Unsuccessful attempts to cut down or control cocaine use.  A lot of time spent obtaining, using, or recovering from the effects of cocaine.  A strong desire or urge to use cocaine (craving).  Continued use of cocaine in spite of major problems at work, school, or home because of use.  Continued use of cocaine in spite of relationship problems because of use.  Giving up or cutting down on important life activities because of cocaine use.  Use of cocaine over and over in situations when it is physically hazardous, such as driving a car.  Continued use of cocaine in spite of a physical problem that is likely related to use. Physical problems can include:  Malnutrition.  Nosebleeds.  Chest pain.  High blood pressure.  A hole that develops between the part of your nose that separates your nostrils (perforated nasal septum).  Lung and kidney damage.  Continued use of cocaine in spite of a mental problem that is likely related to use. Mental problems can include:  Schizophrenia-like symptoms.  Depression.  Bipolar mood swings.  Anxiety.  Sleep problems.  Need to use more and more cocaine to get the same effect, or lessened effect over time with use of the same amount of cocaine (tolerance).  Having withdrawal symptoms when cocaine use is stopped, or using cocaine to reduce or avoid  withdrawal symptoms. Withdrawal symptoms include:  Depressed or irritable mood.  Low energy or restlessness.  Bad dreams.  Poor or excessive sleep.  Increased appetite. DIAGNOSIS Stimulant use disorder is diagnosed by your health care provider. You may be asked questions about your cocaine use and how it affects your life. A physical exam may be done. A drug screen may be ordered. You may be referred to a mental health professional. The diagnosis of stimulant use disorder  requires at least two symptoms within 12 months. The type of stimulant use disorder depends on the number of signs and symptoms you have. The type may be:  Mild. Two or three signs and symptoms.  Moderate. Four or five signs and symptoms.  Severe. Six or more signs and symptoms. TREATMENT Treatment for stimulant use disorder is usually provided by mental health professionals with training in substance use disorders. The following options are available:  Counseling or talk therapy. Talk therapy addresses the reasons you use cocaine and ways to keep you from using again. Goals of talk therapy include:  Identifying and avoiding triggers for use.  Handling cravings.  Replacing use with healthy activities.  Support groups. Support groups provide emotional support, advice, and guidance.  Medicine. Certain medicines may decrease cocaine cravings or withdrawal symptoms. HOME CARE INSTRUCTIONS  Take medicines only as directed by your health care provider.  Identify the people and activities that trigger your cocaine use and avoid them.  Keep all follow-up visits as directed by your health care provider. SEEK MEDICAL CARE IF:  Your symptoms get worse or you relapse.  You are not able to take medicines as directed. SEEK IMMEDIATE MEDICAL CARE IF:  You have serious thoughts about hurting yourself or others.  You have a seizure, chest pain, sudden weakness, or loss of speech or vision. [place] on Drug Abuse: motorcyclefax.com  Substance Abuse and Mental Health Services Administration: ktimeonline.com   This information is not intended to replace advice given to you by your health care provider. Make sure you discuss any questions you have with your health care provider.   Document Released: 09/20/2000 Document Revised: 10/14/2014 Document Reviewed: 10/06/2013 Elsevier Interactive Patient Education 2016 [employer] Inc.  Upper Respiratory Infection, Adult Most upper respiratory infections (URIs) are a viral infection of the air passages leading to the lungs. A URI affects the nose, throat, and upper air passages. The most common type of URI is nasopharyngitis and is typically referred to as "the common cold." URIs run their course and usually go away on their own. Most of the time, a URI does not require medical attention, but sometimes a bacterial infection in the upper airways can follow a viral infection. This is called a secondary infection. Sinus and middle ear infections are common types of secondary upper respiratory infections. Bacterial pneumonia can also complicate a URI. A URI can worsen asthma and chronic obstructive pulmonary disease (COPD). Sometimes, these complications can require emergency medical care and may be life threatening.  CAUSES Almost all URIs are caused by viruses. A virus is a type of germ and can spread from one person to another.  RISKS FACTORS You may be at risk for a URI if:   You smoke.   You have chronic heart or lung disease.  You have a weakened defense (immune) system.   You are very young or very old.   You have nasal allergies or asthma.  You work in crowded or poorly ventilated areas.  You work in health care facilities or schools. SIGNS AND SYMPTOMS  Symptoms typically develop 2-3 days after you come in contact with a cold virus. Most viral URIs last 7-10 days. However, viral URIs from the influenza  virus (flu virus) can last 14-18 days and are typically more severe. Symptoms may include:   Runny or stuffy (congested) nose.   Sneezing.   Cough.   Sore throat.   Headache.   Fatigue.   Fever.  Loss of appetite.   Pain in your forehead, behind your eyes, and over your cheekbones (sinus pain).  Muscle aches.  DIAGNOSIS  Your health care provider may diagnose a URI by:  Physical exam.  Tests to check that your symptoms are not due to another condition such as:  Strep throat.  Sinusitis.  Pneumonia.  Asthma. TREATMENT  A URI goes away on its own with time. It cannot be cured with medicines, but medicines may be prescribed or recommended to relieve symptoms. Medicines may help:  Reduce your fever.  Reduce your cough.  Relieve nasal congestion. HOME CARE INSTRUCTIONS   Take medicines only as directed by your health care provider.   Gargle warm saltwater or take cough drops to comfort your throat as directed by your health care provider.  Use a warm mist humidifier or inhale steam from a shower to increase air moisture. This may make it easier to breathe.  Drink enough fluid to keep your urine clear or pale yellow.   Eat soups and other clear broths and maintain good nutrition.   Rest as needed.   Return to work when your temperature has returned to normal or as your health care provider advises. You may need to stay home longer to avoid infecting others. You can also use a face mask and careful hand washing to prevent spread of the virus.  Increase the usage of your inhaler if you have asthma.   Do not use any tobacco products, including cigarettes, chewing tobacco, or electronic cigarettes. If you need help quitting, ask your health care provider. PREVENTION  The best way to protect yourself from getting a cold is to practice good hygiene.   Avoid oral or hand contact with people with cold symptoms.   Wash your hands often if contact  occurs.  There is no clear evidence that vitamin C, vitamin E, echinacea, or exercise reduces the chance of developing a cold. However, it is always recommended to get plenty of rest, exercise, and practice good nutrition.  SEEK MEDICAL CARE IF:   You are getting worse rather than better.   Your symptoms are not controlled by medicine.   You have chills.  You have worsening shortness of breath.  You have brown or red mucus.  You have yellow or brown nasal discharge.  You have pain in your face, especially when you bend forward.  You have a fever.  You have swollen neck glands.  You have pain while swallowing.  You have white areas in the back of your throat. SEEK IMMEDIATE MEDICAL CARE IF:   You have severe or persistent:  Headache.  Ear pain.  Sinus pain.  Chest pain.  You have chronic lung disease and any of the following:  Wheezing.  Prolonged cough.  Coughing up blood.  A change in your usual mucus.  You have a stiff neck.  You have changes in your:  Vision.  Hearing.  Thinking.  Mood. MAKE SURE YOU:   Understand these instructions.  Will watch your condition.  Will get help right away if you are not doing well or get worse.   This information is not intended to replace advice given to you by your health care provider. Make sure you discuss any questions you have with your health care provider.   Document Released: 03/19/2001 Document Revised: 02/07/2015 Document Reviewed: 12/29/2013 Elsevier Interactive Patient Education [employer].

## 2015-09-21 NOTE — ED Provider Notes (Signed)
By signing my name below, I, Soijett Blue, attest that this documentation has been prepared under the direction and in the presence of [employer], DO. Electronically Signed: Soijett Blue, ED Scribe. 09/21/2015. 3:22 AM.  TIME SEEN: 3:13 AM  CHIEF COMPLAINT: Multiple complaints  HPI: Michael [name] is a 64 y.o. male with a medical hx of HIV infection, asthma, bronchitis, DM, who presents to the Emergency Department complaining of multiple different complaints. He states that he has had SOB onset this week.  Pt is having associated symptoms of productive cough x white sputum, sinus drainage x 1 week. Also reports he has had diffuse chest tightness without radiation every day for the past 6 months. Also reports left lower quadrant abdominal pain x 3 months, and urinary frequency. He denies v/d, dysuria, hematuria, penile discharge, testicular swelling, blood in stools, melena, bowel/bladder incontinence, numbness, tingling or focal weakness, urinary retention, and any other symptoms. Denies ever smoking cigarettes. No history of abdominal surgery. Denies hx of blood clots in legs or lungs. Denies having a PMHx of neuropathy but he notes that he is a diabetic. Denies PMHx of kidney stones.   Pt P[facility] is Dr. Sabra [facility].     ROS: See HPI Constitutional: no fever  Eyes: no drainage  ENT: no runny nose   Cardiovascular:   chest pain  Resp:  SOB  GI: no vomiting GU: no dysuria Integumentary: no rash  Allergy: no hives  Musculoskeletal: no leg swelling  Neurological: no slurred speech ROS otherwise negative  PAST MEDICAL HISTORY/PAST SURGICAL HISTORY:  Past Medical History  Diagnosis Date  . Diabetes mellitus     Diagnosed in 2002, started insulin in 2012  . Hypertension   . Headache(784.0)     CT head 08/2011: Periventricular and subcortical white matter hypodensities are most in keeping with chronic microangiopathic change  . Gout   . Hyperlipidemia   . HIV infection  [facility]) Nov 2012    Followed by [name]  . Crack cocaine use     for 20+ years, has been enrolled in detox programs in the past  . Chondromalacia of medial femoral condyle     Left knee MRI 04/28/12: Chondromalacia of the medial femoral condyle with slight peripheral degeneration of the meniscocapsular junction of the medial meniscus; followed by sports medicine  . Asthma     No PFTs, history of childhood asthma  . Depression     with history of hospitalization for suicidal ideation  . CAD (coronary artery disease)     a. 06/2013 STEMI/PCI ([facility]): LAD w/ thrombus (treated with BMS), mid 75%, D2 75%; LCX OM2 75%; RCA small, PDA 95%, PLV 95%;  b. 10/2013 Cath/PCI: ISR w/in LAD (Promus DES x 2), borderline OM2 lesion;  c. 01/2014 MV: Intermediate risk, medium-sized distal ant wall infarct w/ very small amt of peri-infarct ischemia. EF 60%.  . Gout 04/28/2012  . Collagen vascular disease ([place])   . Cellulitis 04/2014    left facial    MEDICATIONS:  Prior to Admission medications   Medication Sig Start Date End Date Taking? Authorizing Provider  allopurinol (ZYLOPRIM) 100 MG tablet Take 2 tablets (200 mg total) by mouth daily. For gouty arthritis 07/10/15   Liberty Handy, MD  aspirin EC 81 MG tablet Take 1 tablet (81 mg total) by mouth daily. For heart health 09/10/15   Benjamine Mola, FNP  clopidogrel (PLAVIX) 75 MG tablet Take 1 tablet (75 mg total) by mouth daily. For  prevention of stroke 09/10/15   Benjamine Mola, FNP  cyclobenzaprine (FLEXERIL) 10 MG tablet Take 1 tablet (10 mg total) by mouth 2 (two) times daily as needed for muscle spasms. 09/05/15   Courteney Lyn Mackuen, MD  DULoxetine (CYMBALTA) 30 MG capsule Take 1 capsule (30 mg total) by mouth daily. 09/10/15   Benjamine Mola, FNP  Emtricitab-Rilpivir-Tenofov DF (COMPLERA) 200-25-300 MG TABS Take 1 tablet by mouth daily. 09/10/15   Benjamine Mola, FNP  famotidine (PEPCID) 20 MG tablet Take 1 tablet (20 mg total) by mouth daily. 8pm 09/10/15    Benjamine Mola, FNP  Insulin Glargine (LANTUS SOLOSTAR) 100 UNIT/ML Solostar Pen Inject 15 Units into the skin at bedtime. 09/10/15   Benjamine Mola, FNP  isosorbide mononitrate (IMDUR) 30 MG 24 hr tablet TAKE 1 TABLET (30 MG TOTAL) BY MOUTH DAILY. 09/10/15   Benjamine Mola, FNP  metFORMIN (GLUCOPHAGE) 1000 MG tablet Take 1 tablet (1,000 mg total) by mouth 2 (two) times daily with a meal. For diabetes management 12/20/14   Encarnacion Slates, NP  naproxen (NAPROSYN) 500 MG tablet Take 1 tablet (500 mg total) by mouth 2 (two) times daily. 09/10/15   Benjamine Mola, FNP  polyethylene glycol [facility]) packet Take 17 g by mouth daily. 09/10/15   Benjamine Mola, FNP  pravastatin (PRAVACHOL) 40 MG tablet Take 1 tablet (40 mg total) by mouth daily. For high cholesterol 12/20/14   Encarnacion Slates, NP  PROAIR HFA 108 (90 BASE) MCG/ACT inhaler INHALE TWO   PUFFS INTO THE LUNGS EVERY 6 (SIX) HOURS AS NEEDED FOR WHEEZING OR SHORTNESS OF BREATH. 09/04/15   Norman Herrlich, MD    ALLERGIES:  No Known Allergies  SOCIAL HISTORY:  Social History  Substance Use Topics  . Smoking status: Never Smoker   . Smokeless tobacco: Never Used  . Alcohol Use: No    FAMILY HISTORY: Family History  Problem Relation Age of Onset  . Diabetes Mother   . Hypertension Mother   . Hyperlipidemia Mother   . Diabetes Father   . Cancer Father   . Hypertension Father   . Diabetes Brother   . Heart disease Brother   . Colon cancer Neg Hx   . Diabetes Sister     EXAM: BP 135/89 mmHg  Pulse 103  Temp(Src) 98.1 F (36.7 C) (Oral)  Resp 26  Ht 5' 7.5" (1.715 m)  Wt 175 lb (79.379 kg)  BMI 26.99 kg/m2  SpO2 100% CONSTITUTIONAL: Alert and oriented and responds appropriately to questions. Chronically ill-appearing, in no distress HEAD: Normocephalic EYES: Conjunctivae clear, PERRL ENT: normal nose; no rhinorrhea; moist mucous membranes; pharynx without lesions noted. No tonsillar hypertrophy or exudate. No uvula deviation. No  trismus or drooling. Nl phonation.  NECK: Supple, no meningismus, no LAD  CARD: RRR; S1 and S2 appreciated; no murmurs, no clicks, no rubs, no gallops RESP: Normal chest excursion without splinting or tachypnea; breath sounds clear and equal bilaterally; no wheezes, no rhonchi, no rales, no hypoxia or respiratory distress, speaking full sentences ABD/GI: Normal bowel sounds; non-distended; soft, non-tender, no rebound, no guarding, no peritoneal signs BACK:  The back appears normal and is non-tender to palpation, there is no CVA tenderness EXT: Normal ROM in all joints; non-tender to palpation; no edema; normal capillary refill; no cyanosis, no calf tenderness or swelling, 2+ DP and radial pulses bilaterally   SKIN: Normal color for age and race; warm NEURO: Moves all extremities equally, sensation  to light touch intact diffusely, cranial nerves II through XII intact PSYCH: The patient's mood and manner are appropriate. Grooming and personal hygiene are appropriate.  MEDICAL DECISION MAKING: Patient here with multiple complaints, most of which seem chronic. Patient states "I have pain everywhere and nobody has given me any pain medicine". He reports he has been told by his PCP he may need to see a pain management specialist. He has a history of cocaine abuse. Discussed with patient that given all these multiple complaints and recommend close PCP follow-up and he will need pain control with his primary care physician as we do not treat chronic pain from the emergency department. I will give him a dose of fentanyl here in the emergency department for pain control. His labs show acute on chronic renal failure with a creatinine of 1.8. Baseline is between 1.2 and 1.5. We'll give 1 L of IV fluids. Urine shows moderate hemoglobin, rare bacteria. Urine culture pending. We'll obtain CT of his abdomen and pelvis for evaluation for kidney stone given his abdominal pain complaints and hematuria. EKG shows no new  ischemic changes. Chest x-ray is clear. No sign of strep pharyngitis, tonsillitis, theneck infection, meningitis or peritonsillar abscess on exam. Suspect he has a viral URI. Troponin negative. He has had chronic chest pain for 6 months. Abdominal exam today is benign. Likely has neuropathy in his lower extremities secondary diabetes but no new neurologic deficits.  ED PROGRESS: Urine is cocaine positive. Urinalysis shows hematuria and rare bacteria. Culture is pending. CT of his abdomen and pelvis show no acute abnormality. Given a lot of patient's symptoms are chronic feel he will need outpatient follow-up for do not feel there is any life-threatening process present. He does have mild elevation of his creatinine from baseline and I have discussed this with patient and advised he increase his water intake, avoid cocaine and follow-up with his PCP in one week. Also discussed with patient that he will need to follow-up with his primary care physician for management for his chronic pain. Discussed return precautions. He verbalizes understanding and is comfortable with this plan.     EKG Interpretation  Date/Time:  Thursday September 21 2015 05:42:47 EST Ventricular Rate:  90 PR Interval:  155 QRS Duration: 76 QT Interval:  352 QTC Calculation: 431 R Axis:   36 Text Interpretation:  Sinus arrhythmia Posterior infarct, old Nonspecific T abnormalities, inferior leads No significant change since last tracing Confirmed by WARD,  DO, KRISTEN ([id number]) on 09/21/2015 5:46:04 AM        I personally performed the services described in this documentation, which was scribed in my presence. The recorded information has been reviewed and is accurate.     [place], DO 09/21/15 [phone]

## 2015-09-21 NOTE — ED Notes (Signed)
MD at bedside. 

## 2015-09-21 NOTE — ED Notes (Signed)
Patient d/c'd from IV, monitor, continuous pulse oximetry and blood pressure cuff; patient getting dressed to be discharged home; Ward, MD present in room speaking with patient

## 2015-09-21 NOTE — ED Notes (Signed)
Patient transported to CT 

## 2015-09-22 LAB — URINE CULTURE: Culture: NO GROWTH

## 2015-09-25 ENCOUNTER — Encounter (HOSPITAL_COMMUNITY): Payer: Self-pay | Admitting: *Deleted

## 2015-09-25 ENCOUNTER — Emergency Department (HOSPITAL_COMMUNITY)
Admission: EM | Admit: 2015-09-25 | Discharge: 2015-09-26 | Disposition: A | Discharge status: Home / Self Care | Payer: Medicare Other | Attending: Emergency Medicine | Admitting: Emergency Medicine

## 2015-09-25 ENCOUNTER — Emergency Department (HOSPITAL_COMMUNITY): Payer: Medicare Other

## 2015-09-25 DIAGNOSIS — B20 Human immunodeficiency virus [HIV] disease: Secondary | ICD-10-CM | POA: Diagnosis not present

## 2015-09-25 DIAGNOSIS — Z7984 Long term (current) use of oral hypoglycemic drugs: Secondary | ICD-10-CM | POA: Insufficient documentation

## 2015-09-25 DIAGNOSIS — Z794 Long term (current) use of insulin: Secondary | ICD-10-CM | POA: Insufficient documentation

## 2015-09-25 DIAGNOSIS — J45909 Unspecified asthma, uncomplicated: Secondary | ICD-10-CM | POA: Diagnosis not present

## 2015-09-25 DIAGNOSIS — E119 Type 2 diabetes mellitus without complications: Secondary | ICD-10-CM | POA: Diagnosis not present

## 2015-09-25 DIAGNOSIS — Z872 Personal history of diseases of the skin and subcutaneous tissue: Secondary | ICD-10-CM | POA: Diagnosis not present

## 2015-09-25 DIAGNOSIS — F1410 Cocaine abuse, uncomplicated: Secondary | ICD-10-CM | POA: Diagnosis not present

## 2015-09-25 DIAGNOSIS — F329 Major depressive disorder, single episode, unspecified: Secondary | ICD-10-CM | POA: Diagnosis not present

## 2015-09-25 DIAGNOSIS — Z79899 Other long term (current) drug therapy: Secondary | ICD-10-CM | POA: Diagnosis not present

## 2015-09-25 DIAGNOSIS — I2510 Atherosclerotic heart disease of native coronary artery without angina pectoris: Secondary | ICD-10-CM | POA: Insufficient documentation

## 2015-09-25 DIAGNOSIS — R0602 Shortness of breath: Secondary | ICD-10-CM | POA: Diagnosis not present

## 2015-09-25 DIAGNOSIS — F332 Major depressive disorder, recurrent severe without psychotic features: Secondary | ICD-10-CM | POA: Diagnosis present

## 2015-09-25 DIAGNOSIS — M109 Gout, unspecified: Secondary | ICD-10-CM | POA: Diagnosis not present

## 2015-09-25 DIAGNOSIS — I10 Essential (primary) hypertension: Secondary | ICD-10-CM | POA: Diagnosis not present

## 2015-09-25 DIAGNOSIS — Z7902 Long term (current) use of antithrombotics/antiplatelets: Secondary | ICD-10-CM | POA: Diagnosis not present

## 2015-09-25 DIAGNOSIS — Z791 Long term (current) use of non-steroidal anti-inflammatories (NSAID): Secondary | ICD-10-CM | POA: Diagnosis not present

## 2015-09-25 DIAGNOSIS — Z7982 Long term (current) use of aspirin: Secondary | ICD-10-CM | POA: Insufficient documentation

## 2015-09-25 DIAGNOSIS — R45851 Suicidal ideations: Secondary | ICD-10-CM | POA: Diagnosis not present

## 2015-09-25 DIAGNOSIS — E785 Hyperlipidemia, unspecified: Secondary | ICD-10-CM | POA: Diagnosis not present

## 2015-09-25 DIAGNOSIS — F32A Depression, unspecified: Secondary | ICD-10-CM

## 2015-09-25 LAB — I-STAT TROPONIN, ED: Troponin i, poc: 0.01 ng/mL (ref 0.00–0.08)

## 2015-09-25 NOTE — ED Notes (Signed)
Pt states that he has been using Crack Cocaine for "awhile"; pt states that he is having thoughts about suicide and is hearing voices telling him to harm himself; pt states that he has a plan to overdose; pt states that he has random thoughts about hurting other people but nobody specific; pt states that he last used a couple of hours ago; pt states that he is having chest pain off and on, worse after using crack

## 2015-09-26 DIAGNOSIS — F332 Major depressive disorder, recurrent severe without psychotic features: Secondary | ICD-10-CM

## 2015-09-26 LAB — COMPREHENSIVE METABOLIC PANEL
ALT: 17 U/L (ref 17–63)
AST: 23 U/L (ref 15–41)
Albumin: 4.8 g/dL (ref 3.5–5.0)
Alkaline Phosphatase: 77 U/L (ref 38–126)
Anion gap: 13 (ref 5–15)
BUN: 23 mg/dL — ABNORMAL HIGH (ref 6–20)
CHLORIDE: 106 mmol/L (ref 101–111)
CO2: 22 mmol/L (ref 22–32)
Calcium: 9.7 mg/dL (ref 8.9–10.3)
Creatinine, Ser: 1.78 mg/dL — ABNORMAL HIGH (ref 0.61–1.24)
GFR, EST AFRICAN AMERICAN: 44 mL/min — AB (ref >60)
GFR, EST NON AFRICAN AMERICAN: 38 mL/min — AB (ref >60)
Glucose, Bld: 218 mg/dL — ABNORMAL HIGH (ref 65–99)
POTASSIUM: 4.4 mmol/L (ref 3.5–5.1)
SODIUM: 141 mmol/L (ref 135–145)
Total Bilirubin: 0.9 mg/dL (ref 0.3–1.2)
Total Protein: 8.6 g/dL — ABNORMAL HIGH (ref 6.5–8.1)

## 2015-09-26 LAB — RAPID URINE DRUG SCREEN, HOSP PERFORMED
Amphetamines: NOT DETECTED
Barbiturates: NOT DETECTED
Benzodiazepines: NOT DETECTED
Cocaine: POSITIVE — AB
OPIATES: NOT DETECTED
Tetrahydrocannabinol: NOT DETECTED

## 2015-09-26 LAB — CBC
HCT: 36.5 % — ABNORMAL LOW (ref 39.0–52.0)
Hemoglobin: 11.9 g/dL — ABNORMAL LOW (ref 13.0–17.0)
MCH: 25.8 pg — ABNORMAL LOW (ref 26.0–34.0)
MCHC: 32.6 g/dL (ref 30.0–36.0)
MCV: 79.2 fL (ref 78.0–100.0)
PLATELETS: 200 10*3/uL (ref 150–400)
RBC: 4.61 MIL/uL (ref 4.22–5.81)
RDW: 14.2 % (ref 11.5–15.5)
WBC: 4.1 10*3/uL (ref 4.0–10.5)

## 2015-09-26 LAB — CBG MONITORING, ED
GLUCOSE-CAPILLARY: 198 mg/dL — AB (ref 65–99)
GLUCOSE-CAPILLARY: 129 mg/dL — AB (ref 65–99)
Glucose-Capillary: 209 mg/dL — ABNORMAL HIGH (ref 65–99)
Glucose-Capillary: 121 mg/dL — ABNORMAL HIGH (ref 65–99)

## 2015-09-26 LAB — ACETAMINOPHEN LEVEL: Acetaminophen (Tylenol), Serum: <10 ug/mL — ABNORMAL LOW (ref 10–30)

## 2015-09-26 LAB — ETHANOL

## 2015-09-26 LAB — SALICYLATE LEVEL

## 2015-09-26 MED ORDER — EMTRICITAB-RILPIVIR-TENOFOV DF 200-25-300 MG PO TABS
1.0000 | ORAL_TABLET | Freq: Every day | ORAL | Status: DC
  Administered 2015-09-26: 1 via ORAL
  Filled 2015-09-26: qty 1

## 2015-09-26 MED ORDER — DULOXETINE HCL 30 MG PO CPEP
30.0000 mg | ORAL_CAPSULE | Freq: Every day | ORAL | Status: DC
  Administered 2015-09-26: 30 mg via ORAL
  Filled 2015-09-26: qty 1

## 2015-09-26 MED ORDER — AMANTADINE HCL 100 MG PO CAPS
100.0000 mg | ORAL_CAPSULE | Freq: Every day | ORAL | Status: DC
  Administered 2015-09-26: 100 mg via ORAL
  Filled 2015-09-26: qty 1

## 2015-09-26 MED ORDER — GABAPENTIN 300 MG PO CAPS
300.0000 mg | ORAL_CAPSULE | Freq: Every day | ORAL | Status: DC
  Administered 2015-09-26: 300 mg via ORAL
  Filled 2015-09-26: qty 1

## 2015-09-26 MED ORDER — ALBUTEROL SULFATE HFA 108 (90 BASE) MCG/ACT IN AERS: 2.0000 | INHALATION_SPRAY | RESPIRATORY_TRACT | Status: DC | PRN

## 2015-09-26 MED ORDER — CLOPIDOGREL BISULFATE 75 MG PO TABS
75.0000 mg | ORAL_TABLET | Freq: Every day | ORAL | Status: DC
  Administered 2015-09-26: 75 mg via ORAL
  Filled 2015-09-26 (×2): qty 1

## 2015-09-26 MED ORDER — ALLOPURINOL 100 MG PO TABS
200.0000 mg | ORAL_TABLET | Freq: Every day | ORAL | Status: DC
  Administered 2015-09-26: 200 mg via ORAL
  Filled 2015-09-26: qty 2

## 2015-09-26 MED ORDER — INSULIN ASPART 100 UNIT/ML ~~LOC~~ SOLN
0.0000 [IU] | SUBCUTANEOUS | Status: DC
  Administered 2015-09-26: 2 [IU] via SUBCUTANEOUS
  Administered 2015-09-26: 4 [IU] via SUBCUTANEOUS
  Administered 2015-09-26: 2 [IU] via SUBCUTANEOUS
  Filled 2015-09-26 (×3): qty 1

## 2015-09-26 MED ORDER — IBUPROFEN 200 MG PO TABS
600.0000 mg | ORAL_TABLET | Freq: Three times a day (TID) | ORAL | Status: DC | PRN
  Administered 2015-09-26: 600 mg via ORAL
  Filled 2015-09-26: qty 3

## 2015-09-26 MED ORDER — ISOSORBIDE MONONITRATE ER 30 MG PO TB24
30.0000 mg | ORAL_TABLET | Freq: Every day | ORAL | Status: DC
  Administered 2015-09-26: 30 mg via ORAL
  Filled 2015-09-26: qty 1

## 2015-09-26 MED ORDER — PRAVASTATIN SODIUM 40 MG PO TABS
40.0000 mg | ORAL_TABLET | Freq: Every day | ORAL | Status: DC
  Filled 2015-09-26: qty 1

## 2015-09-26 MED ORDER — LORAZEPAM 1 MG PO TABS
1.0000 mg | ORAL_TABLET | Freq: Three times a day (TID) | ORAL | Status: DC | PRN
  Administered 2015-09-26: 1 mg via ORAL
  Filled 2015-09-26: qty 1

## 2015-09-26 MED ORDER — ONDANSETRON HCL 4 MG PO TABS: 4.0000 mg | ORAL_TABLET | Freq: Three times a day (TID) | ORAL | Status: DC | PRN

## 2015-09-26 MED ORDER — METFORMIN HCL 500 MG PO TABS
1000.0000 mg | ORAL_TABLET | Freq: Two times a day (BID) | ORAL | Status: DC
  Administered 2015-09-26: 1000 mg via ORAL
  Filled 2015-09-26 (×3): qty 2

## 2015-09-26 MED ORDER — FAMOTIDINE 20 MG PO TABS
20.0000 mg | ORAL_TABLET | Freq: Every day | ORAL | Status: DC
  Administered 2015-09-26 (×2): 20 mg via ORAL
  Filled 2015-09-26 (×2): qty 1

## 2015-09-26 MED ORDER — LOSARTAN POTASSIUM 50 MG PO TABS
50.0000 mg | ORAL_TABLET | Freq: Every day | ORAL | Status: DC
  Administered 2015-09-26: 50 mg via ORAL
  Filled 2015-09-26: qty 1

## 2015-09-26 MED ORDER — EMTRICITAB-RILPIVIR-TENOFOV DF 200-25-300 MG PO TABS: 1.0000 | ORAL_TABLET | Freq: Every day | ORAL | Status: DC

## 2015-09-26 MED ORDER — ASPIRIN EC 81 MG PO TBEC
81.0000 mg | DELAYED_RELEASE_TABLET | Freq: Every day | ORAL | Status: DC
  Administered 2015-09-26: 81 mg via ORAL
  Filled 2015-09-26: qty 1

## 2015-09-26 MED ORDER — INSULIN GLARGINE 100 UNIT/ML ~~LOC~~ SOLN
15.0000 [IU] | Freq: Every day | SUBCUTANEOUS | Status: DC
  Administered 2015-09-26: 15 [IU] via SUBCUTANEOUS
  Filled 2015-09-26 (×2): qty 0.15

## 2015-09-26 MED ORDER — ZOLPIDEM TARTRATE 5 MG PO TABS
5.0000 mg | ORAL_TABLET | Freq: Every evening | ORAL | Status: DC | PRN
  Administered 2015-09-26: 5 mg via ORAL
  Filled 2015-09-26: qty 1

## 2015-09-26 MED ORDER — ACETAMINOPHEN 325 MG PO TABS: 650.0000 mg | ORAL_TABLET | ORAL | Status: DC | PRN

## 2015-09-26 MED ORDER — DILTIAZEM HCL ER COATED BEADS 120 MG PO CP24
120.0000 mg | ORAL_CAPSULE | Freq: Every day | ORAL | Status: DC
  Administered 2015-09-26: 120 mg via ORAL
  Filled 2015-09-26: qty 1

## 2015-09-26 NOTE — BHH Counselor (Signed)
Received a call from Celso Sickle, staff at [facility]. Patient accepted to [facility]. The accepting provider is Dr. Macky Lower. Nursing Report # [phone]. Patient's nurse updated about new disposition information. Patient is voluntary and [facility]y Pries will provide transport.

## 2015-09-26 NOTE — ED Provider Notes (Signed)
CSN: [id number]     Arrival date & time 09/25/15  2247 History  By signing my name below, I, Michael [name], attest that this documentation has been prepared under the direction and in the presence of Michael Greek, MD. Electronically Signed: Julien [name], ED Scribe. 09/26/2015. 12:13 AM.     Chief Complaint  Patient presents with  . Suicidal      The history is provided by the patient. No language interpreter was used.   HPI Comments: Michael [name] is a 64 y.o. male who presents to the Emergency Department complaining of suicidal ideations onset for awhile. He has been having intermittent chest pain which he reports is worse when he uses crack cocaine. Pt has a pertinent history of cocaine use over the years and he states he last used a couple of hours ago. Michael [name] was seen last month to see a doctor and was not seen. Pt has not gotten any help for his addiction and would like to have help now. Pt reports he has thoughts of suicidal ideations and he has thought about overdosing.   Past Medical History  Diagnosis Date  . Diabetes mellitus     Diagnosed in 2002, started insulin in 2012  . Hypertension   . Headache(784.0)     CT head 08/2011: Periventricular and subcortical white matter hypodensities are most in keeping with chronic microangiopathic change  . Gout   . Hyperlipidemia   . HIV infection [facility]) Nov 2012    Followed by [name]  . Crack cocaine use     for 20+ years, has been enrolled in detox programs in the past  . Chondromalacia of medial femoral condyle     Left knee MRI 04/28/12: Chondromalacia of the medial femoral condyle with slight peripheral degeneration of the meniscocapsular junction of the medial meniscus; followed by sports medicine  . Asthma     No PFTs, history of childhood asthma  . Depression     with history of hospitalization for suicidal ideation  . CAD (coronary artery disease)     a. 06/2013 STEMI/PCI ([facility]): LAD w/ thrombus (treated  with BMS), mid 75%, D2 75%; LCX OM2 75%; RCA small, PDA 95%, PLV 95%;  b. 10/2013 Cath/PCI: ISR w/in LAD (Promus DES x 2), borderline OM2 lesion;  c. 01/2014 MV: Intermediate risk, medium-sized distal ant wall infarct w/ very small amt of peri-infarct ischemia. EF 60%.  . Gout 04/28/2012  . Collagen vascular disease ([place])   . Cellulitis 04/2014    left facial   Past Surgical History  Procedure Laterality Date  . Bowel resection    . Back surgery      1988  . Cardiac surgery    . Cervical spine surgery      " rods in my neck "  . Coronary stent placement    . Nm myocar perf wall motion  12/27/2011    normal  . Spine surgery     Family History  Problem Relation Age of Onset  . Diabetes Mother   . Hypertension Mother   . Hyperlipidemia Mother   . Diabetes Father   . Cancer Father   . Hypertension Father   . Diabetes Brother   . Heart disease Brother   . Colon cancer Neg Hx   . Diabetes Sister    Social History  Substance Use Topics  . Smoking status: Never Smoker   . Smokeless tobacco: Never Used  . Alcohol Use: No    Review  of Systems  All other systems reviewed and are negative.     Allergies  Review of patient's allergies indicates no known allergies.  Home Medications   Prior to Admission medications   Medication Sig Start Date End Date Taking? Authorizing Provider  allopurinol (ZYLOPRIM) 100 MG tablet Take 2 tablets (200 mg total) by mouth daily. For gouty arthritis 07/10/15  Yes Liberty Handy, MD  amantadine (SYMMETREL) 100 MG capsule Take 1 capsule by mouth daily. 09/22/12  Yes Historical Provider, MD  aspirin EC 81 MG tablet Take 1 tablet (81 mg total) by mouth daily. For heart health 09/10/15  Yes Benjamine Mola, FNP  clopidogrel (PLAVIX) 75 MG tablet Take 1 tablet (75 mg total) by mouth daily. For prevention of stroke 09/10/15  Yes Benjamine Mola, FNP  cyclobenzaprine (FLEXERIL) 10 MG tablet Take 1 tablet (10 mg total) by mouth 2 (two) times daily as needed for  muscle spasms. 09/05/15  Yes Courteney Lyn Mackuen, MD  diltiazem (CARDIZEM CD) 120 MG 24 hr capsule Take 1 capsule by mouth daily. 06/13/13  Yes Historical Provider, MD  DULoxetine (CYMBALTA) 30 MG capsule Take 1 capsule (30 mg total) by mouth daily. 09/10/15  Yes Benjamine Mola, FNP  Emtricitab-Rilpivir-Tenofov DF (COMPLERA) 200-25-300 MG TABS Take 1 tablet by mouth daily. 09/10/15  Yes Benjamine Mola, FNP  famotidine (PEPCID) 20 MG tablet Take 1 tablet (20 mg total) by mouth daily. 8pm 09/10/15  Yes Benjamine Mola, FNP  gabapentin (NEURONTIN) 300 MG capsule Take 1 capsule by mouth daily.   Yes Historical Provider, MD  Insulin Glargine (LANTUS SOLOSTAR) 100 UNIT/ML Solostar Pen Inject 15 Units into the skin at bedtime. 09/10/15  Yes Benjamine Mola, FNP  isosorbide mononitrate (IMDUR) 30 MG 24 hr tablet TAKE 1 TABLET (30 MG TOTAL) BY MOUTH DAILY. 09/10/15  Yes Benjamine Mola, FNP  losartan (COZAAR) 50 MG tablet Take 1 tablet by mouth daily. 08/15/15  Yes Historical Provider, MD  metFORMIN (GLUCOPHAGE) 1000 MG tablet Take 1 tablet (1,000 mg total) by mouth 2 (two) times daily with a meal. For diabetes management 12/20/14  Yes Encarnacion Slates, NP  naproxen (NAPROSYN) 500 MG tablet Take 1 tablet (500 mg total) by mouth 2 (two) times daily. 09/10/15  Yes Benjamine Mola, FNP  polyethylene glycol (MIRALAX) packet Take 17 g by mouth daily. 09/10/15  Yes Benjamine Mola, FNP  pravastatin (PRAVACHOL) 40 MG tablet Take 1 tablet (40 mg total) by mouth daily. For high cholesterol 12/20/14  Yes Encarnacion Slates, NP  PROAIR HFA 108 (90 BASE) MCG/ACT inhaler INHALE TWO   PUFFS INTO THE LUNGS EVERY 6 (SIX) HOURS AS NEEDED FOR WHEEZING OR SHORTNESS OF BREATH. 09/04/15  Yes Norman Herrlich, MD   Triage vitals: BP 142/86 mmHg  Pulse 100  Temp(Src) 97.6 F (36.4 C) (Oral)  Resp 18  SpO2 100% Physical Exam  Constitutional: He is oriented to person, place, and time. He appears well-developed and well-nourished. No distress.  HENT:   Head: Normocephalic and atraumatic.  Right Ear: Hearing normal.  Left Ear: Hearing normal.  Nose: Nose normal.  Mouth/Throat: Oropharynx is clear and moist and mucous membranes are normal.  Eyes: Conjunctivae and EOM are normal. Pupils are equal, round, and reactive to light.  Neck: Normal range of motion. Neck supple.  Cardiovascular: Regular rhythm, S1 normal and S2 normal.  Exam reveals no gallop and no friction rub.   No murmur heard. Pulmonary/Chest: Effort normal and breath  sounds normal. No respiratory distress. He exhibits no tenderness.  Abdominal: Soft. Normal appearance and bowel sounds are normal. There is no hepatosplenomegaly. There is no tenderness. There is no rebound, no guarding, no tenderness at McBurney's point and negative Murphy's sign. No hernia.  Musculoskeletal: Normal range of motion.  Neurological: He is alert and oriented to person, place, and time. He has normal strength. No cranial nerve deficit or sensory deficit. Coordination normal. GCS eye subscore is 4. GCS verbal subscore is 5. GCS motor subscore is 6.  Skin: Skin is warm, dry and intact. No rash noted. No cyanosis.  Psychiatric: He has a normal mood and affect. His speech is normal and behavior is normal. Thought content normal.  Nursing note and vitals reviewed.   ED Course  Procedures  DIAGNOSTIC STUDIES: Oxygen Saturation is 100% on RA, normal by my interpretation.  COORDINATION OF CARE:  12:11 AM Discussed treatment plan which includes TTS consult with pt at bedside and pt agreed to plan.  Labs Review Labs Reviewed  COMPREHENSIVE METABOLIC PANEL  ETHANOL  SALICYLATE LEVEL  ACETAMINOPHEN LEVEL  CBC  URINE RAPID DRUG SCREEN, HOSP PERFORMED  I-STAT [place], ED    Imaging Review Dg Chest 2 View  09/25/2015  CLINICAL DATA:  Chest pain and shortness of breath. EXAM: CHEST  2 VIEW COMPARISON:  September 21, 2015 FINDINGS: There is no edema or consolidation. The heart size and pulmonary  vascularity are normal. No adenopathy. There is a left anterior descending coronary artery stent present. No pneumothorax. There is degenerative change in the thoracic spine. IMPRESSION: No edema or consolidation. Electronically Signed   By: Lowella Grip III M.D.   On: 09/25/2015 23:37   I have personally reviewed and evaluated these images and lab results as part of my medical decision-making.   EKG Interpretation   Date/Time:  Monday September 25 2015 23:17:35 EST Ventricular Rate:  100 PR Interval:  138 QRS Duration: 93 QT Interval:  340 QTC Calculation: 438 R Axis:   64 Text Interpretation:  Fast sinus arrhythmia Abnormal R-wave progression,  early transition No significant change since last tracing Confirmed by  POLLINA  MD, [place] [id number]) on 09/26/2015 12:25:05 AM      MDM   Final diagnoses:  None  Depression Suicidal Ideation  Presents to the emergency department with complaints of progressively worsening depression and suicidality. Medical screening examination performed, patient will require evaluation by TTS for potential placement.  I personally performed the services described in this documentation, which was scribed in my presence. The recorded information has been reviewed and is accurate.   Michael Greek, MD 09/26/15 [phone]

## 2015-09-26 NOTE — Consult Note (Signed)
[facility] Consult   Reason for Consult:  Depression, suicide feelings, Cocaine use disorder, severe Dependency Referring Physician:  EDP Patient Identification: Michael [name] MRN:  [id number] Principal Diagnosis: MDD (major depressive disorder), recurrent severe, without psychosis ([place]) Diagnosis:   Patient Active Problem List   Diagnosis Date Noted  . MDD (major depressive disorder), recurrent severe, without psychosis ([place]) [F33.2] 09/09/2015    Priority: High  . Cocaine dependence with cocaine-induced mood disorder ([place]) [F14.24]   . Cocaine-induced mood disorder ([place]) [F14.94] 08/14/2015  . Cocaine dependence ([place]) [F14.20] 08/14/2015  . Cocaine abuse with cocaine-induced mood disorder ([place]) [F14.14] 08/14/2015  . Gout [M10.9] 07/10/2015  . HIV disease ([place]) [B20] 07/10/2015  . Cough [R05] 04/26/2015  . Lactic acidosis [E87.2] 03/06/2015  . CKD (chronic kidney disease) stage 3, GFR 30-59 ml/min [N18.3] 03/06/2015  . Normocytic anemia [D64.9] 03/06/2015  . Hypoglycemia [E16.2]   . Encounter for general adult medical examination with abnormal findings [Z00.01, R68.89] 02/09/2015  . Cocaine use disorder, severe, dependence ([place]) [F14.20] 12/13/2014  . Substance or medication-induced depressive disorder with onset during withdrawal ([place]) [F19.94] 12/13/2014  . Cervicalgia [M54.2] 06/28/2014  . Lumbar radiculopathy, chronic [M54.16] 06/28/2014  . Asthma, chronic [J45.909] 02/03/2014  . 3-vessel CAD [I25.10] 06/24/2013  . ED (erectile dysfunction) of organic origin [N52.8] 07/07/2012  . Hypertension goal BP (blood pressure) < 140/80 [I10] 04/29/2012  . Chondromalacia of left knee [M94.262] 03/19/2012  . Hyperlipidemia with target LDL less than 100 [E78.5] 02/12/2012  . Fibromyalgia [M79.7] 02/12/2012  . HIV (human immunodeficiency virus infection) ([place]) [Z21] 08/27/2011  . Uncontrolled type 2 diabetes with neuropathy ([place]) [E11.40, E11.65] 10/17/2000    Total  Time spent with patient: 45 minutes  Subjective:   Michael [name] is a 64 y.o. [name] patient admitted with Depression, suicide feelings, Cocaine use disorder, severe Dependency  HPI:  Michael [name], 64 years old was evaluated for suicide ideation with plans to kill himself using Cocaine.  Patient reports that his chronic lower back pain is his main stressor.  Patient reports that he lives in pain all day and cannot receive adequate treatment.  Patient also reports [facility] did an MRI and that he has not heard from them again.  Patient reports that he has been homeless for 3 weeks and also does not want to live alone anymore.  He is seeking a shelter at [facility].  Patient is unable to contract for safety and plans to OD on Cocaine.  He has been accepted for admission and we will be seeking placement at any facility with available bed..  Past Psychiatric History:  MDD, Cocaine use disorder, Cocaine induced mood disorder.  Risk to Self: Suicidal Ideation: Yes-Currently Present Suicidal Intent: No Is patient at risk for suicide?: Yes Suicidal Plan?: Yes-Currently Present Specify Current Suicidal Plan: Overdose Access to Means: Yes Specify Access to Suicidal Means: Access to medications What has been your use of drugs/alcohol within the last 12 months?: Daily cocaine use How many times?: 0 Other Self Harm Risks: SA Triggers for Past Attempts:  (n/a) Intentional Self Injurious Behavior: None Risk to Others: Homicidal Ideation: No Thoughts of Harm to Others: Yes-Currently Present Comment - Thoughts of Harm to Others: random thoughts of wanting to harm others - no one in particular Current Homicidal Intent: No Current Homicidal Plan: No Access to Homicidal Means: No Identified Victim: n/a History of harm to others?: No Assessment of Violence: None Noted Violent Behavior Description: No known hx  of violence Does patient have access to weapons?: No Criminal Charges Pending?:  No Does patient have a court date: No Prior Inpatient Therapy: Prior Inpatient Therapy: Yes Prior Therapy Dates: Late 90's Prior Therapy Facilty/Provider(s): [facility] Reason for Treatment: SA treatment Prior Outpatient Therapy: Prior Outpatient Therapy: No Prior Therapy Dates: na Prior Therapy Facilty/Provider(s): na Reason for Treatment: na Does patient have an ACCT team?: No Does patient have Intensive In-House Services?  : No Does patient have [facility] services? : No Does patient have P4CC services?: No  Past Medical History:  Past Medical History  Diagnosis Date  . Diabetes mellitus     Diagnosed in 2002, started insulin in 2012  . Hypertension   . Headache(784.0)     CT head 08/2011: Periventricular and subcortical white matter hypodensities are most in keeping with chronic microangiopathic change  . Gout   . Hyperlipidemia   . HIV infection [facility]) Nov 2012    Followed by [name]  . Crack cocaine use     for 20+ years, has been enrolled in detox programs in the past  . Chondromalacia of medial femoral condyle     Left knee MRI 04/28/12: Chondromalacia of the medial femoral condyle with slight peripheral degeneration of the meniscocapsular junction of the medial meniscus; followed by sports medicine  . Asthma     No PFTs, history of childhood asthma  . Depression     with history of hospitalization for suicidal ideation  . CAD (coronary artery disease)     a. 06/2013 STEMI/PCI ([facility]): LAD w/ thrombus (treated with BMS), mid 75%, D2 75%; LCX OM2 75%; RCA small, PDA 95%, PLV 95%;  b. 10/2013 Cath/PCI: ISR w/in LAD (Promus DES x 2), borderline OM2 lesion;  c. 01/2014 MV: Intermediate risk, medium-sized distal ant wall infarct w/ very small amt of peri-infarct ischemia. EF 60%.  . Gout 04/28/2012  . Collagen vascular disease ([place])   . Cellulitis 04/2014    left facial    Past Surgical History  Procedure Laterality Date  . Bowel resection    . Back surgery      1988   . Cardiac surgery    . Cervical spine surgery      " rods in my neck "  . Coronary stent placement    . Nm myocar perf wall motion  12/27/2011    normal  . Spine surgery     Family History:  Family History  Problem Relation Age of Onset  . Diabetes Mother   . Hypertension Mother   . Hyperlipidemia Mother   . Diabetes Father   . Cancer Father   . Hypertension Father   . Diabetes Brother   . Heart disease Brother   . Colon cancer Neg Hx   . Diabetes Sister    Family Psychiatric  History: Substance abuse Social History:  History  Alcohol Use No     History  Drug Use  . Yes  . Special: "Crack" cocaine, Cocaine    Comment: last used crack 08/2015.     Social History   Social History  . Marital Status: Divorced    Spouse Name: N/A  . Number of Children: N/A  . Years of Education: N/A   Social History Main Topics  . Smoking status: Never Smoker   . Smokeless tobacco: Never Used  . Alcohol Use: No  . Drug Use: Yes    Special: "Crack" cocaine, Cocaine     Comment: last used crack 08/2015.   Marland Kitchen  Sexual Activity: Yes     Comment: accepted condoms   Other Topics Concern  . None   Social History Narrative   Currently staying with a friend in [place].  Was staying @ local motel until a few days ago - left b/c of bed bugs.   Additional Social History:    Pain Medications: See PTA med list Prescriptions: See PTA med list Over the Counter: See PTA med list History of alcohol / drug use?: Yes Longest period of sobriety (when/how long): 3 yrs Negative Consequences of Use: Financial, Legal, Personal relationships Name of Substance 1: cocaine 1 - Age of First Use: 41 1 - Amount (size/oz): Usually about $300 worth 1 - Frequency: daily 1 - Duration: Past 23 years 1 - Last Use / Amount: Yesterday, 09/25/15   Allergies:  No Known Allergies  Labs:  Results for orders placed or performed during the hospital encounter of 09/25/15 (from the past 48 hour(s))  Comprehensive  metabolic panel     Status: Abnormal   Collection Time: 09/25/15 11:37 PM  Result Value Ref Range   Sodium 141 135 - 145 mmol/L   Potassium 4.4 3.5 - 5.1 mmol/L   Chloride 106 101 - 111 mmol/L   CO2 22 22 - 32 mmol/L   Glucose, Bld 218 (H) 65 - 99 mg/dL   BUN 23 (H) 6 - 20 mg/dL   Creatinine, Ser 1.78 (H) 0.61 - 1.24 mg/dL   Calcium 9.7 8.9 - 10.3 mg/dL   Total Protein 8.6 (H) 6.5 - 8.1 g/dL   Albumin 4.8 3.5 - 5.0 g/dL   AST 23 15 - 41 U/L   ALT 17 17 - 63 U/L   Alkaline Phosphatase 77 38 - 126 U/L   Total Bilirubin 0.9 0.3 - 1.2 mg/dL   GFR calc non Af Amer 38 (L) >60 mL/min   GFR calc Af Amer 44 (L) >60 mL/min    Comment: (NOTE) The eGFR has been calculated using the CKD EPI equation. This calculation has not been validated in all clinical situations. eGFR's persistently <60 mL/min signify possible Chronic Kidney Disease.    Anion gap 13 5 - 15  Ethanol (ETOH)     Status: None   Collection Time: 09/25/15 11:37 PM  Result Value Ref Range   Alcohol, Ethyl (B) <5 <5 mg/dL    Comment:        LOWEST DETECTABLE LIMIT FOR SERUM ALCOHOL IS 5 mg/dL FOR MEDICAL PURPOSES ONLY   Salicylate level     Status: None   Collection Time: 09/25/15 11:37 PM  Result Value Ref Range   Salicylate Lvl <4.0 2.8 - 30.0 mg/dL  Acetaminophen level     Status: Abnormal   Collection Time: 09/25/15 11:37 PM  Result Value Ref Range   Acetaminophen (Tylenol), Serum <10 (L) 10 - 30 ug/mL    Comment:        THERAPEUTIC CONCENTRATIONS VARY SIGNIFICANTLY. A RANGE OF 10-30 ug/mL MAY BE AN EFFECTIVE CONCENTRATION FOR MANY PATIENTS. HOWEVER, SOME ARE BEST TREATED AT CONCENTRATIONS OUTSIDE THIS RANGE. ACETAMINOPHEN CONCENTRATIONS >150 ug/mL AT 4 HOURS AFTER INGESTION AND >50 ug/mL AT 12 HOURS AFTER INGESTION ARE OFTEN ASSOCIATED WITH TOXIC REACTIONS.   CBC     Status: Abnormal   Collection Time: 09/25/15 11:37 PM  Result Value Ref Range   WBC 4.1 4.0 - 10.5 K/uL   RBC 4.61 4.22 - 5.81 MIL/uL    Hemoglobin 11.9 (L) 13.0 - 17.0 g/dL   HCT 36.5 (L)  39.0 - 52.0 %   MCV 79.2 78.0 - 100.0 fL   MCH 25.8 (L) 26.0 - 34.0 pg   MCHC 32.6 30.0 - 36.0 g/dL   RDW 14.2 11.5 - 15.5 %   Platelets 200 150 - 400 K/uL  I-Stat Troponin, ED (not at [facility])     Status: None   Collection Time: 09/25/15 11:44 PM  Result Value Ref Range   Troponin i, poc 0.01 0.00 - 0.08 ng/mL   Comment 3            Comment: Due to the release kinetics of cTnI, a negative result within the first hours of the onset of symptoms does not rule out myocardial infarction with certainty. If myocardial infarction is still suspected, repeat the test at appropriate intervals.   POC CBG, ED     Status: Abnormal   Collection Time: 09/26/15  3:14 AM  Result Value Ref Range   Glucose-Capillary 209 (H) 65 - 99 mg/dL  CBG monitoring, ED     Status: Abnormal   Collection Time: 09/26/15  6:16 AM  Result Value Ref Range   Glucose-Capillary 198 (H) 65 - 99 mg/dL  CBG monitoring, ED     Status: Abnormal   Collection Time: 09/26/15  8:17 AM  Result Value Ref Range   Glucose-Capillary 129 (H) 65 - 99 mg/dL  Urine rapid drug screen (hosp performed) (Not at [facility])     Status: Abnormal   Collection Time: 09/26/15  8:53 AM  Result Value Ref Range   Opiates NONE DETECTED NONE DETECTED   Cocaine POSITIVE (A) NONE DETECTED   Benzodiazepines NONE DETECTED NONE DETECTED   Amphetamines NONE DETECTED NONE DETECTED   Tetrahydrocannabinol NONE DETECTED NONE DETECTED   Barbiturates NONE DETECTED NONE DETECTED    Comment:        DRUG SCREEN FOR MEDICAL PURPOSES ONLY.  IF CONFIRMATION IS NEEDED FOR ANY PURPOSE, NOTIFY LAB WITHIN 5 DAYS.        LOWEST DETECTABLE LIMITS FOR URINE DRUG SCREEN Drug Class       Cutoff (ng/mL) Amphetamine      1000 Barbiturate      200 Benzodiazepine   425 Tricyclics       956 Opiates          300 Cocaine          300 THC              50     Current Facility-Administered Medications  Medication Dose  Route Frequency Provider Last Rate Last Dose  . acetaminophen (TYLENOL) tablet 650 mg  650 mg Oral Q4H PRN Orpah Greek, MD      . albuterol (PROVENTIL HFA;VENTOLIN HFA) 108 (90 BASE) MCG/ACT inhaler 2 puff  2 puff Inhalation Q4H PRN Orpah Greek, MD      . allopurinol (ZYLOPRIM) tablet 200 mg  200 mg Oral Daily Orpah Greek, MD      . amantadine (SYMMETREL) capsule 100 mg  100 mg Oral Daily Orpah Greek, MD      . aspirin EC tablet 81 mg  81 mg Oral Daily Orpah Greek, MD      . clopidogrel (PLAVIX) tablet 75 mg  75 mg Oral Daily Orpah Greek, MD   75 mg at 09/26/15 0826  . diltiazem (CARDIZEM CD) 24 hr capsule 120 mg  120 mg Oral Daily Orpah Greek, MD      . DULoxetine (CYMBALTA) DR capsule 30 mg  30 mg Oral Daily Orpah Greek, MD      . emtricitabine-rilpivir-tenofovir DF (COMPLERA) 200-25-300 MG per tablet 1 tablet  1 tablet Oral Daily Orpah Greek, MD      . famotidine (PEPCID) tablet 20 mg  20 mg Oral Daily Orpah Greek, MD   20 mg at 09/26/15 0046  . gabapentin (NEURONTIN) capsule 300 mg  300 mg Oral Daily Orpah Greek, MD      . ibuprofen (ADVIL,MOTRIN) tablet 600 mg  600 mg Oral Q8H PRN Orpah Greek, MD   600 mg at 09/26/15 0046  . insulin aspart (novoLOG) injection 0-24 Units  0-24 Units Subcutaneous 6 times per day Orpah Greek, MD   2 Units at 09/26/15 [phone]  . insulin glargine (LANTUS) injection 15 Units  15 Units Subcutaneous QHS Orpah Greek, MD   15 Units at 09/26/15 0046  . isosorbide mononitrate (IMDUR) 24 hr tablet 30 mg  30 mg Oral Daily Orpah Greek, MD      . LORazepam (ATIVAN) tablet 1 mg  1 mg Oral Q8H PRN Orpah Greek, MD   1 mg at 09/26/15 0046  . losartan (COZAAR) tablet 50 mg  50 mg Oral Daily Orpah Greek, MD      . metFORMIN (GLUCOPHAGE) tablet 1,000 mg  1,000 mg Oral BID WC Orpah Greek, MD   1,000 mg at  09/26/15 0826  . ondansetron (ZOFRAN) tablet 4 mg  4 mg Oral Q8H PRN Orpah Greek, MD      . pravastatin (PRAVACHOL) tablet 40 mg  40 mg Oral Daily Orpah Greek, MD      . zolpidem (AMBIEN) tablet 5 mg  5 mg Oral QHS PRN Orpah Greek, MD   5 mg at 09/26/15 0160   Current Outpatient Prescriptions  Medication Sig Dispense Refill  . allopurinol (ZYLOPRIM) 100 MG tablet Take 2 tablets (200 mg total) by mouth daily. For gouty arthritis 60 tablet 3  . amantadine (SYMMETREL) 100 MG capsule Take 1 capsule by mouth daily.    Marland Kitchen aspirin EC 81 MG tablet Take 1 tablet (81 mg total) by mouth daily. For heart health    . clopidogrel (PLAVIX) 75 MG tablet Take 1 tablet (75 mg total) by mouth daily. For prevention of stroke 14 tablet 0  . cyclobenzaprine (FLEXERIL) 10 MG tablet Take 1 tablet (10 mg total) by mouth 2 (two) times daily as needed for muscle spasms. 10 tablet 0  . diltiazem (CARDIZEM CD) 120 MG 24 hr capsule Take 1 capsule by mouth daily.    . DULoxetine (CYMBALTA) 30 MG capsule Take 1 capsule (30 mg total) by mouth daily. 14 capsule 0  . Emtricitab-Rilpivir-Tenofov DF (COMPLERA) 200-25-300 MG TABS Take 1 tablet by mouth daily. 30 tablet 3  . famotidine (PEPCID) 20 MG tablet Take 1 tablet (20 mg total) by mouth daily. 8pm 14 tablet 0  . gabapentin (NEURONTIN) 300 MG capsule Take 1 capsule by mouth daily.    . Insulin Glargine (LANTUS SOLOSTAR) 100 UNIT/ML Solostar Pen Inject 15 Units into the skin at bedtime. 15 pen 5  . isosorbide mononitrate (IMDUR) 30 MG 24 hr tablet TAKE 1 TABLET (30 MG TOTAL) BY MOUTH DAILY. 30 tablet 1  . losartan (COZAAR) 50 MG tablet Take 1 tablet by mouth daily.    . metFORMIN (GLUCOPHAGE) 1000 MG tablet Take 1 tablet (1,000 mg total) by mouth 2 (two) times daily with a meal. For diabetes  management    . naproxen (NAPROSYN) 500 MG tablet Take 1 tablet (500 mg total) by mouth 2 (two) times daily. 20 tablet 0  . polyethylene glycol (MIRALAX) packet  Take 17 g by mouth daily. 14 each 0  . pravastatin (PRAVACHOL) 40 MG tablet Take 1 tablet (40 mg total) by mouth daily. For high cholesterol 30 tablet   . PROAIR HFA 108 (90 BASE) MCG/ACT inhaler INHALE TWO   PUFFS INTO THE LUNGS EVERY 6 (SIX) HOURS AS NEEDED FOR WHEEZING OR SHORTNESS OF BREATH. 1 Inhaler 6    Musculoskeletal: Strength & Muscle Tone: seen lying down flat in bed Gait & Station: seen lying down in bed Patient leans: see above  Psychiatric Specialty Exam: Review of Systems  Constitutional: Negative.   HENT: Negative.   Respiratory:       Hx of asthma  Cardiovascular:       Hx htn,   Gastrointestinal: Negative.   Genitourinary: Negative.   Musculoskeletal:       Hx lower back pain and previous surgery.  Lumber Radiculopathy,Chronic  Skin: Negative.   Neurological:       Hx of Fibromyalgia  Endo/Heme/Allergies:       Hx of DM    Blood pressure 127/78, pulse 87, temperature 97.6 F (36.4 C), temperature source Oral, resp. rate 16, SpO2 99 %.There is no weight on file to calculate BMI.  General Appearance: Casual  Eye Contact::  Minimal  Speech:  Clear and Coherent and Normal Rate  Volume:  Normal  Mood:  NA, Angry, Anxious and Depressed  Affect:  Congruent, Depressed and Flat  Thought Process:  Coherent, Goal Directed and Intact  Orientation:  Full (Time, Place, and Person)  Thought Content:  WDL  Suicidal Thoughts:  No  Homicidal Thoughts:  No  Memory:  Immediate;   Good Recent;   Good Remote;   Good  Judgement:  Poor  Insight:  Fair  Psychomotor Activity:  Psychomotor Retardation  Concentration:  Fair  Recall:  Good  Fund of Knowledge:Fair  Language: Good  Akathisia:  No  Handed:  Right  AIMS (if indicated):     Assets:  Desire for Improvement Housing Transportation  ADL's:  Intact  Cognition: WNL  Sleep:      Treatment Plan Summary: Daily contact with patient to assess and evaluate symptoms and progress in treatment and Medication  management  Disposition:  Accepted for admission and  We will be seeking placement at any facility with available.  Resume all home medications.  Patient has been accepted at [facility]   PMHNP-BC 09/26/2015 9:27 AM

## 2015-09-26 NOTE — Progress Notes (Signed)
Consulted by TTS  Spoke with pt who states his co pay for complera was $0  Pt now scheduled to be looked at for d/c by [facility] hospital per TTS

## 2015-09-26 NOTE — BH Assessment (Signed)
[facility] Assessment Progress Note  This pt's prior acceptance to [facility] was conditioned upon medications being sent with him to the facility.  At 14:37 Gerald Stabs calls from [facility].  Dr Wilburn Cornelia has agreed to accept pt without condition.  Pt will go to the [place] [facility].  Charmaine Downs, NP, concurs with this decision.  Pt's nurse has been informed.  She agrees to call report to [phone].  Pt is voluntary and will be transported via Stacey Drain, [facility]ist [phone]

## 2015-09-26 NOTE — ED Notes (Signed)
Report called to [facility].  Call [phone] prior to d/c

## 2015-09-26 NOTE — BH Assessment (Addendum)
Tele Assessment Note   Michael [name] is an African-American, divorced 64 y.o. male presenting unaccompanied to [facility] c/o worsening depression with SI and chronic substance abuse. Pt reports depressive sx, including hopelessness, fatigue, feelings of worthlessness, insomnia, loss of appetite with associated weight loss, feelings of guilt, crying spells, loss of pleasure, increased irritability, and social isolation. He also reports excessive worrying, especially about his poor relationship with his 66 year old daughter. Pt becomes tearful when speaking about his daughter never calling him and not wanting him to be a part of her life; he reports that this is a major trigger for his substance use and he feels like he is currently under more stress than usual. The pt uses about $300 worth of crack cocaine per day and has done so since the age of 75. He denies use of any other substances. His use has affected his relationships, caused legal problems, and led to financial stressors. The pt reports current SI with a plan to overdose on medication. He denies any prior suicide attempts. Pt denies HI but endorses thoughts of wanting to harm others. He does not want to harm anyone in particular and has no hx of violent behavior. He reports having no social support. Pt has received detox treatment in the past, including a [facility] admission in 2013. He also has several prior [facility] admissions (12/2014, 05/2012) and [facility] admissions (09/2015, 08/2015). Pt denies VH, self-harming behaviors, and hx of abuse or trauma. He denies any legal problems. He is currently living at the [facility]. Per chart, pt uses a cane and a shower chair. Pt cannot contract for safety.  Pt is lying on a stretcher and is dressed in wine colored scrubs. He presents with good eye-contact and concentration. Mood is depressed and affect is congruent. Pt is alert and oriented x4. Hygiene is fair. Pt is cooperative with the interview and very polite  towards the counselor. Pt's speech is logical but he speaks so softly that it is sometimes difficult to hear what he is saying. He does not appear to be responding to internal stimuli at the present time and he states that he is not currently hearing any voices. Thought process is coherent and relevant with no indication of delusional thought content.  - Per Patriciaann Clan, PA, Pt meets inpt criteria. No appropriate beds at [facility] currently. TTS to seek placement.  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 Diabetes mellitus     Diagnosed in 2002, started insulin in 2012  . Hypertension   . Headache(784.0)     CT head 08/2011: Periventricular and subcortical white matter hypodensities are most in keeping with chronic microangiopathic change  . Gout   . Hyperlipidemia   . HIV infection [facility]) Nov 2012    Followed by [name]  . Crack cocaine use     for 20+ years, has been enrolled in detox programs in the past  . Chondromalacia of medial femoral condyle     Left knee MRI 04/28/12: Chondromalacia of the medial femoral condyle with slight peripheral degeneration of the meniscocapsular junction of the medial meniscus; followed by sports medicine  . Asthma     No PFTs, history of childhood asthma  . Depression     with history of hospitalization for suicidal ideation  . CAD (coronary artery disease)     a. 06/2013 STEMI/PCI ([facility]): LAD w/ thrombus (treated  with BMS), mid 75%, D2 75%; LCX OM2 75%; RCA small, PDA 95%, PLV 95%;  b. 10/2013 Cath/PCI: ISR w/in LAD (Promus DES x 2), borderline OM2 lesion;  c. 01/2014 MV: Intermediate risk, medium-sized distal ant wall infarct w/ very small amt of peri-infarct ischemia. EF 60%.  . Gout 04/28/2012  . Collagen vascular disease ([place])   . Cellulitis 04/2014    left facial    Past Surgical History  Procedure Laterality  Date  . Bowel resection    . Back surgery      1988  . Cardiac surgery    . Cervical spine surgery      " rods in my neck "  . Coronary stent placement    . Nm myocar perf wall motion  12/27/2011    normal  . Spine surgery      Family History:  Family History  Problem Relation Age of Onset  . Diabetes Mother   . Hypertension Mother   . Hyperlipidemia Mother   . Diabetes Father   . Cancer Father   . Hypertension Father   . Diabetes Brother   . Heart disease Brother   . Colon cancer Neg Hx   . Diabetes Sister     Social History:  reports that he has never smoked. He has never used smokeless tobacco. He reports that he uses illicit drugs ("Crack" cocaine and Cocaine). He reports that he does not drink alcohol.  Additional Social History:  Alcohol / Drug Use Pain Medications: See PTA med list Prescriptions: See PTA med list Over the Counter: See PTA med list History of alcohol / drug use?: Yes Longest period of sobriety (when/how long): 3 yrs Negative Consequences of Use: Financial, Legal, Personal relationships Substance #1 Name of Substance 1: cocaine 1 - Age of First Use: 64 1 - Amount (size/oz): Usually about $300 worth 1 - Frequency: daily 1 - Duration: Past 23 years 1 - Last Use / Amount: Yesterday, 09/25/15  CIWA: CIWA-Ar BP: 128/80 mmHg Pulse Rate: 92 COWS:    PATIENT STRENGTHS: (choose at least two) Ability for insight Average or above average intelligence Communication skills  Allergies: No Known Allergies  Home Medications:  (Not in a hospital admission)  OB/GYN Status:  No LMP for male patient.  General Assessment Data Location of Assessment: [facility] ED TTS Assessment: In system Is this a Tele or Face-to-Face Assessment?: Face-to-Face Is this an Initial Assessment or a Re-assessment for this encounter?: Initial Assessment Marital status: Divorced Is patient pregnant?: No Pregnancy Status: No Living Arrangements: Other (Comment) Can pt return to  current living arrangement?: Yes Admission Status: Voluntary Is patient capable of signing voluntary admission?: Yes Referral Source: Self/Family/Friend Insurance type: [facility] Medicare     Crisis Care Plan Living Arrangements: Other (Comment) Legal Guardian:  (n/a) Name of Psychiatrist: n Name of Therapist: n  Education Status Is patient currently in school?: No Current Grade: na Highest grade of school patient has completed: 12 Name of school: na Contact person: na  Risk to self with the past 6 months Suicidal Ideation: Yes-Currently Present Has patient been a risk to self within the past 6 months prior to admission? : Yes Suicidal Intent: No Has patient had any suicidal intent within the past 6 months prior to admission? : No Is patient at risk for suicide?: Yes Suicidal Plan?: Yes-Currently Present Has patient had any suicidal plan within the past 6 months prior to admission? : Yes Specify Current Suicidal Plan: Overdose Access to Means: Yes  Specify Access to Suicidal Means: Access to medications What has been your use of drugs/alcohol within the last 12 months?: Daily cocaine use Previous Attempts/Gestures: No How many times?: 0 Other Self Harm Risks: SA Triggers for Past Attempts:  (n/a) Intentional Self Injurious Behavior: None Family Suicide History: No Recent stressful life event(s): Recent negative physical changes (chronic pain, headaches) Persecutory voices/beliefs?: No Depression: Yes Depression Symptoms: Insomnia, Tearfulness, Isolating, Fatigue, Guilt, Feeling worthless/self pity Substance abuse history and/or treatment for substance abuse?: Yes Suicide prevention information given to non-admitted patients: Not applicable  Risk to Others within the past 6 months Homicidal Ideation: No Does patient have any lifetime risk of violence toward others beyond the six months prior to admission? : No Thoughts of Harm to Others: Yes-Currently Present Comment -  Thoughts of Harm to Others: random thoughts of wanting to harm others - no one in particular Current Homicidal Intent: No Current Homicidal Plan: No Access to Homicidal Means: No Identified Victim: n/a History of harm to others?: No Assessment of Violence: None Noted Violent Behavior Description: No known hx of violence Does patient have access to weapons?: No Criminal Charges Pending?: No Does patient have a court date: No Is patient on probation?: No  Psychosis Hallucinations: Auditory, With command Delusions: None noted  Mental Status Report Appearance/Hygiene: In scrubs Eye Contact: Fair Motor Activity: Freedom of movement Speech: Logical/coherent Level of Consciousness: Quiet/awake Mood: Depressed Affect: Depressed Anxiety Level: None Thought Processes: Coherent, Relevant Judgement: Impaired Orientation: Person, Place, Time, Situation Obsessive Compulsive Thoughts/Behaviors: None  Cognitive Functioning Concentration: Normal Memory: Recent Intact IQ: Average Insight: Good Impulse Control: Poor Appetite: Poor Weight Loss:  (unknown amount) Weight Gain: 0 Sleep: Decreased Total Hours of Sleep: 5 Vegetative Symptoms: None  ADLScreening [facility] Assessment Services) Patient's cognitive ability adequate to safely complete daily activities?: Yes Patient able to express need for assistance with ADLs?: Yes Independently performs ADLs?: Yes (appropriate for developmental age)  Prior Inpatient Therapy Prior Inpatient Therapy: Yes Prior Therapy Dates: Late 90's Prior Therapy Facilty/Provider(s): [facility] Reason for Treatment: SA treatment  Prior Outpatient Therapy Prior Outpatient Therapy: No Prior Therapy Dates: na Prior Therapy Facilty/Provider(s): na Reason for Treatment: na Does patient have an ACCT team?: No Does patient have Intensive In-House Services?  : No Does patient have [facility] services? : No Does patient have P4CC services?: No  ADL  Screening (condition at time of admission) Patient's cognitive ability adequate to safely complete daily activities?: Yes Is the patient deaf or have difficulty hearing?: No Does the patient have difficulty seeing, even when wearing glasses/contacts?: No Does the patient have difficulty concentrating, remembering, or making decisions?: No Patient able to express need for assistance with ADLs?: Yes Does the patient have difficulty dressing or bathing?: Yes Independently performs ADLs?: Yes (appropriate for developmental age) Dressing (OT): Independent Bathing: Independent Does the patient have difficulty walking or climbing stairs?: Yes Weakness of Legs: None Weakness of Arms/Hands: None  Home Assistive Devices/Equipment Home Assistive Devices/Equipment: Cane (specify quad or straight)    Abuse/Neglect Assessment (Assessment to be complete while patient is alone) Physical Abuse: Denies Verbal Abuse: Denies Sexual Abuse: Denies Exploitation of patient/patient's resources: Denies Self-Neglect: Denies Values / Beliefs Cultural Requests During Hospitalization: None Spiritual Requests During Hospitalization: None   Advance Directives (For Healthcare) Does patient have an advance directive?: No Would patient like information on creating an advanced directive?: No - patient declined information    Additional Information 1:1 In Past 12 Months?: No CIRT Risk: No Elopement Risk:  No Does patient have medical clearance?: Yes     Disposition: Per Patriciaann Clan, PA, Pt meets inpt criteria. Disposition Initial Assessment Completed for this Encounter: Yes Disposition of Patient: Inpatient treatment program Type of inpatient treatment program: Adult  [name], [facility]  09/26/2015 3:29 AM

## 2015-09-26 NOTE — ED Notes (Signed)
Called report to [facility].  Pt pending meds prior to transport.

## 2015-09-28 ENCOUNTER — Encounter: Payer: Self-pay | Admitting: Internal Medicine

## 2015-09-28 ENCOUNTER — Ambulatory Visit: Payer: Self-pay | Admitting: Internal Medicine

## 2015-09-28 DIAGNOSIS — J45909 Unspecified asthma, uncomplicated: Secondary | ICD-10-CM | POA: Diagnosis not present

## 2015-09-28 DIAGNOSIS — E119 Type 2 diabetes mellitus without complications: Secondary | ICD-10-CM | POA: Diagnosis not present

## 2015-09-28 DIAGNOSIS — Z21 Asymptomatic human immunodeficiency virus [HIV] infection status: Secondary | ICD-10-CM | POA: Diagnosis not present

## 2015-09-28 DIAGNOSIS — I2510 Atherosclerotic heart disease of native coronary artery without angina pectoris: Secondary | ICD-10-CM | POA: Diagnosis not present

## 2015-09-28 IMAGING — CR DG FOOT COMPLETE 3+V*L*
3 series · 3 of 3 positions shown · non-contrast
Comparison: None.

CLINICAL DATA: Left foot soreness last 2 days. Plantar blister from
2nd to 5th toe at the level of the MTP joints.

EXAM:
LEFT FOOT - COMPLETE 3+ VIEW

[x foot ap left]
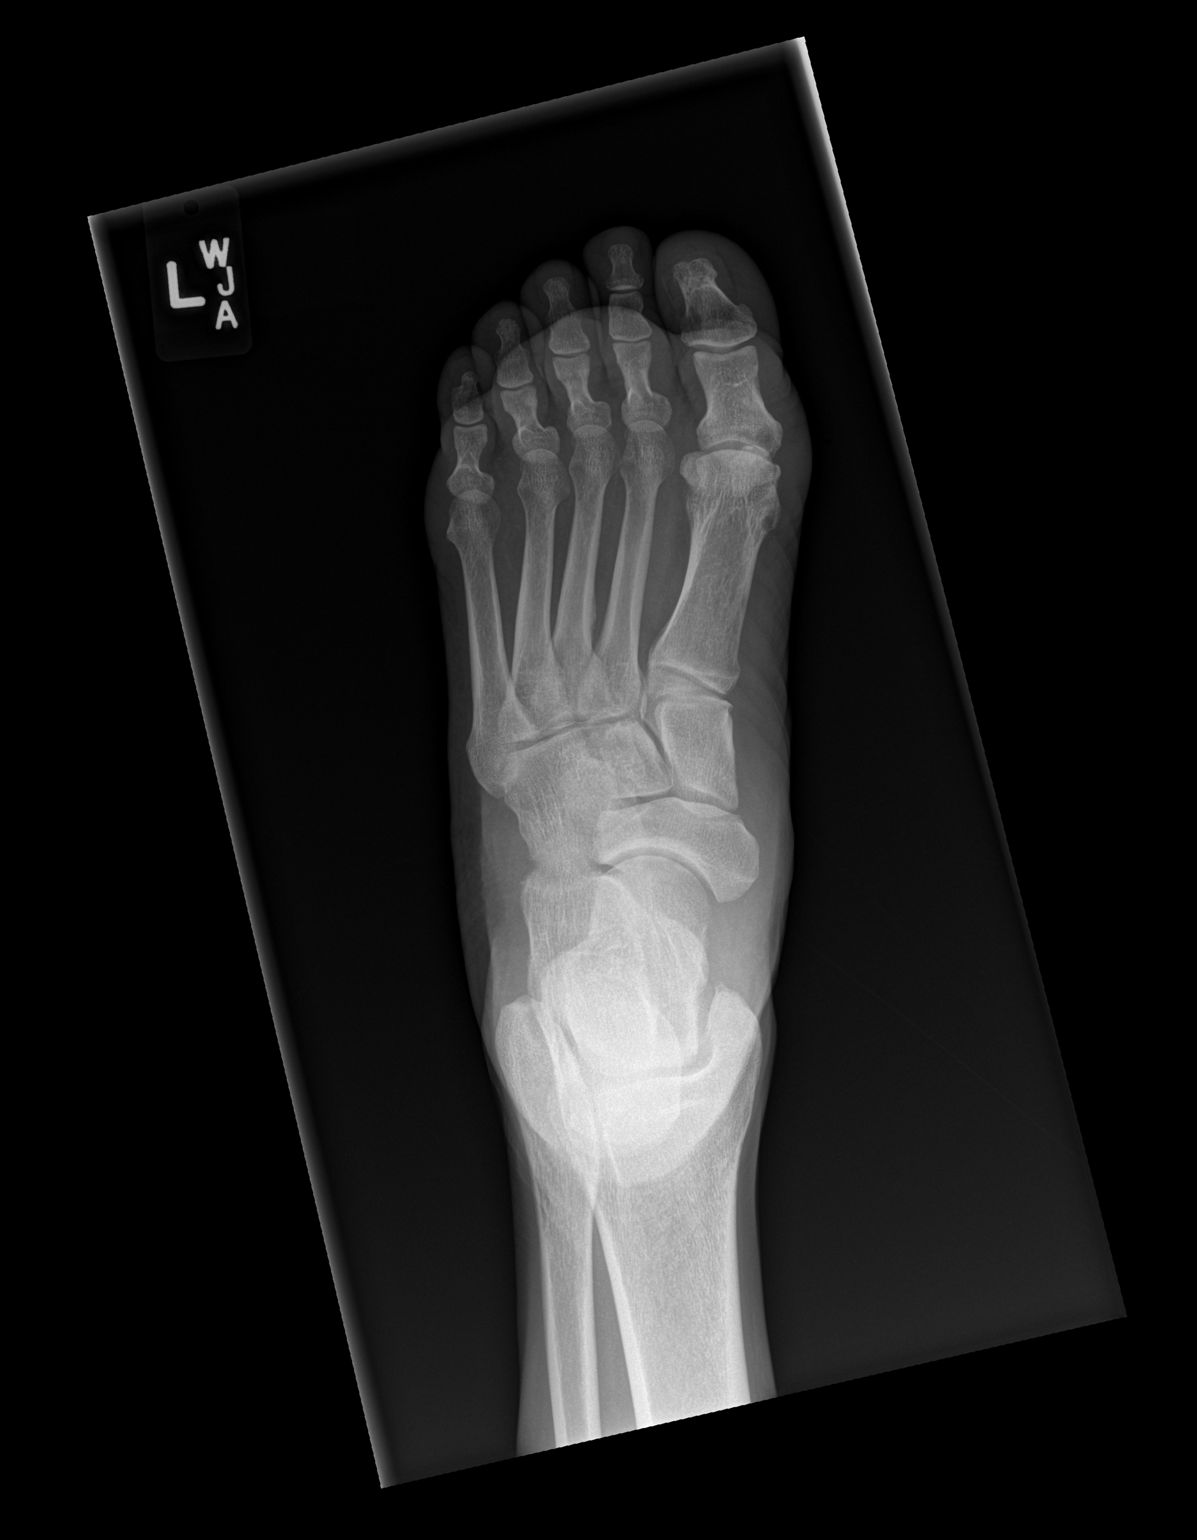

[x foot obl left]
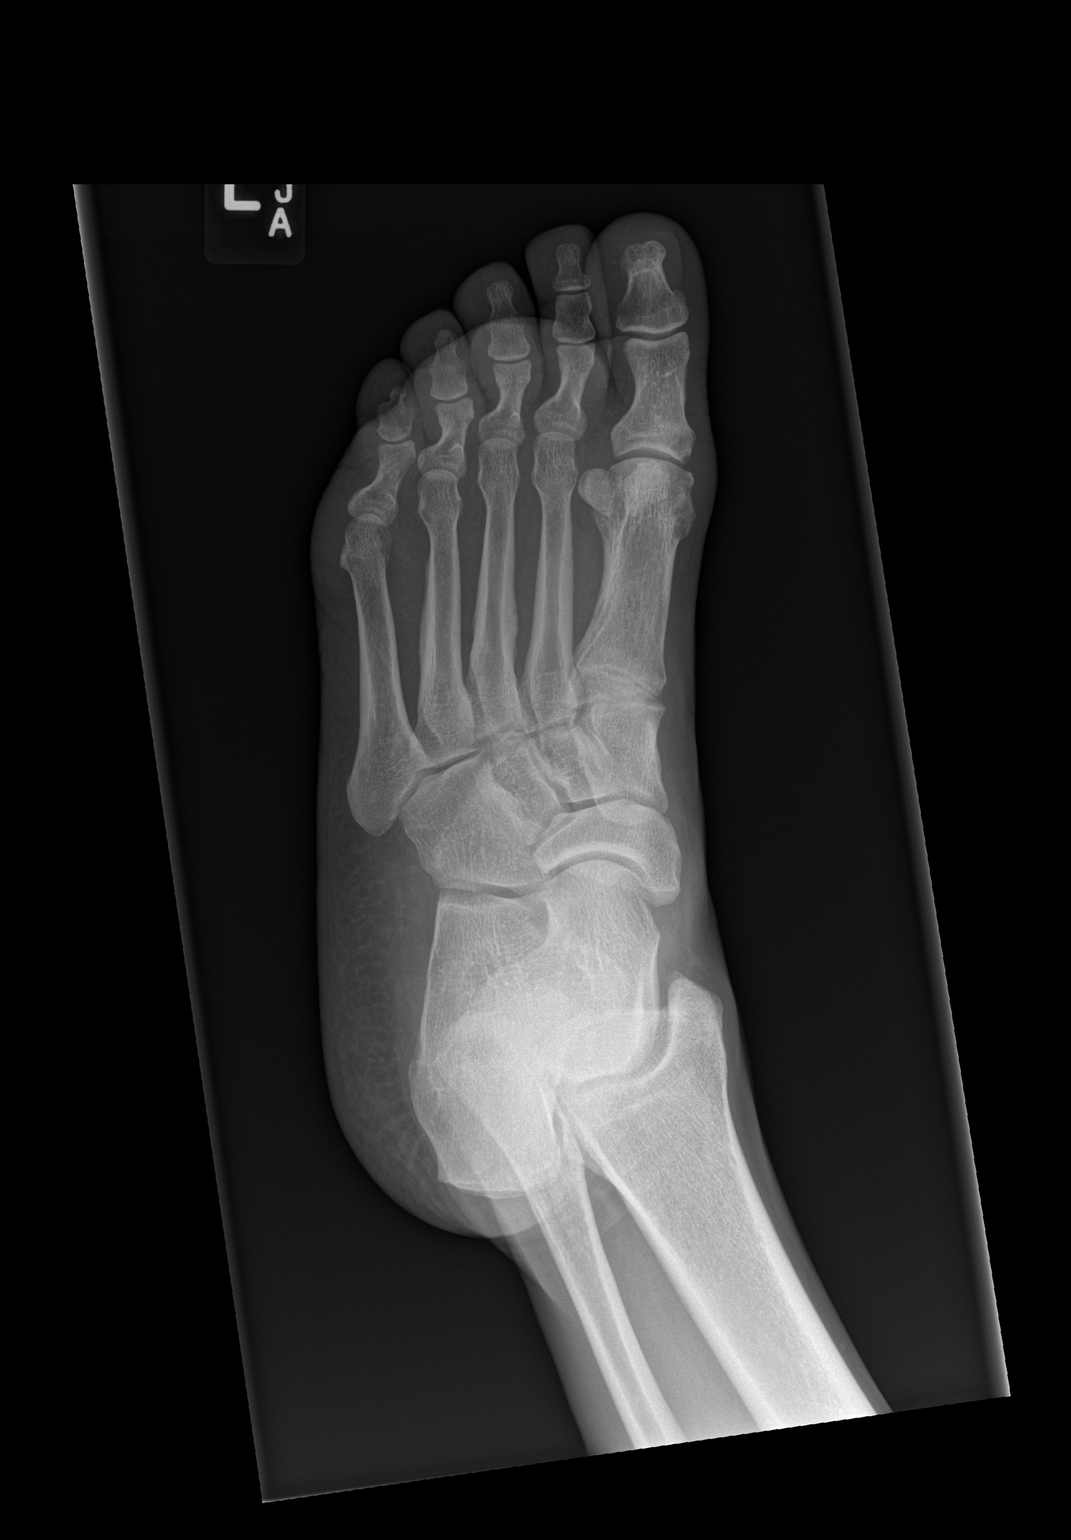

[x foot lat left]
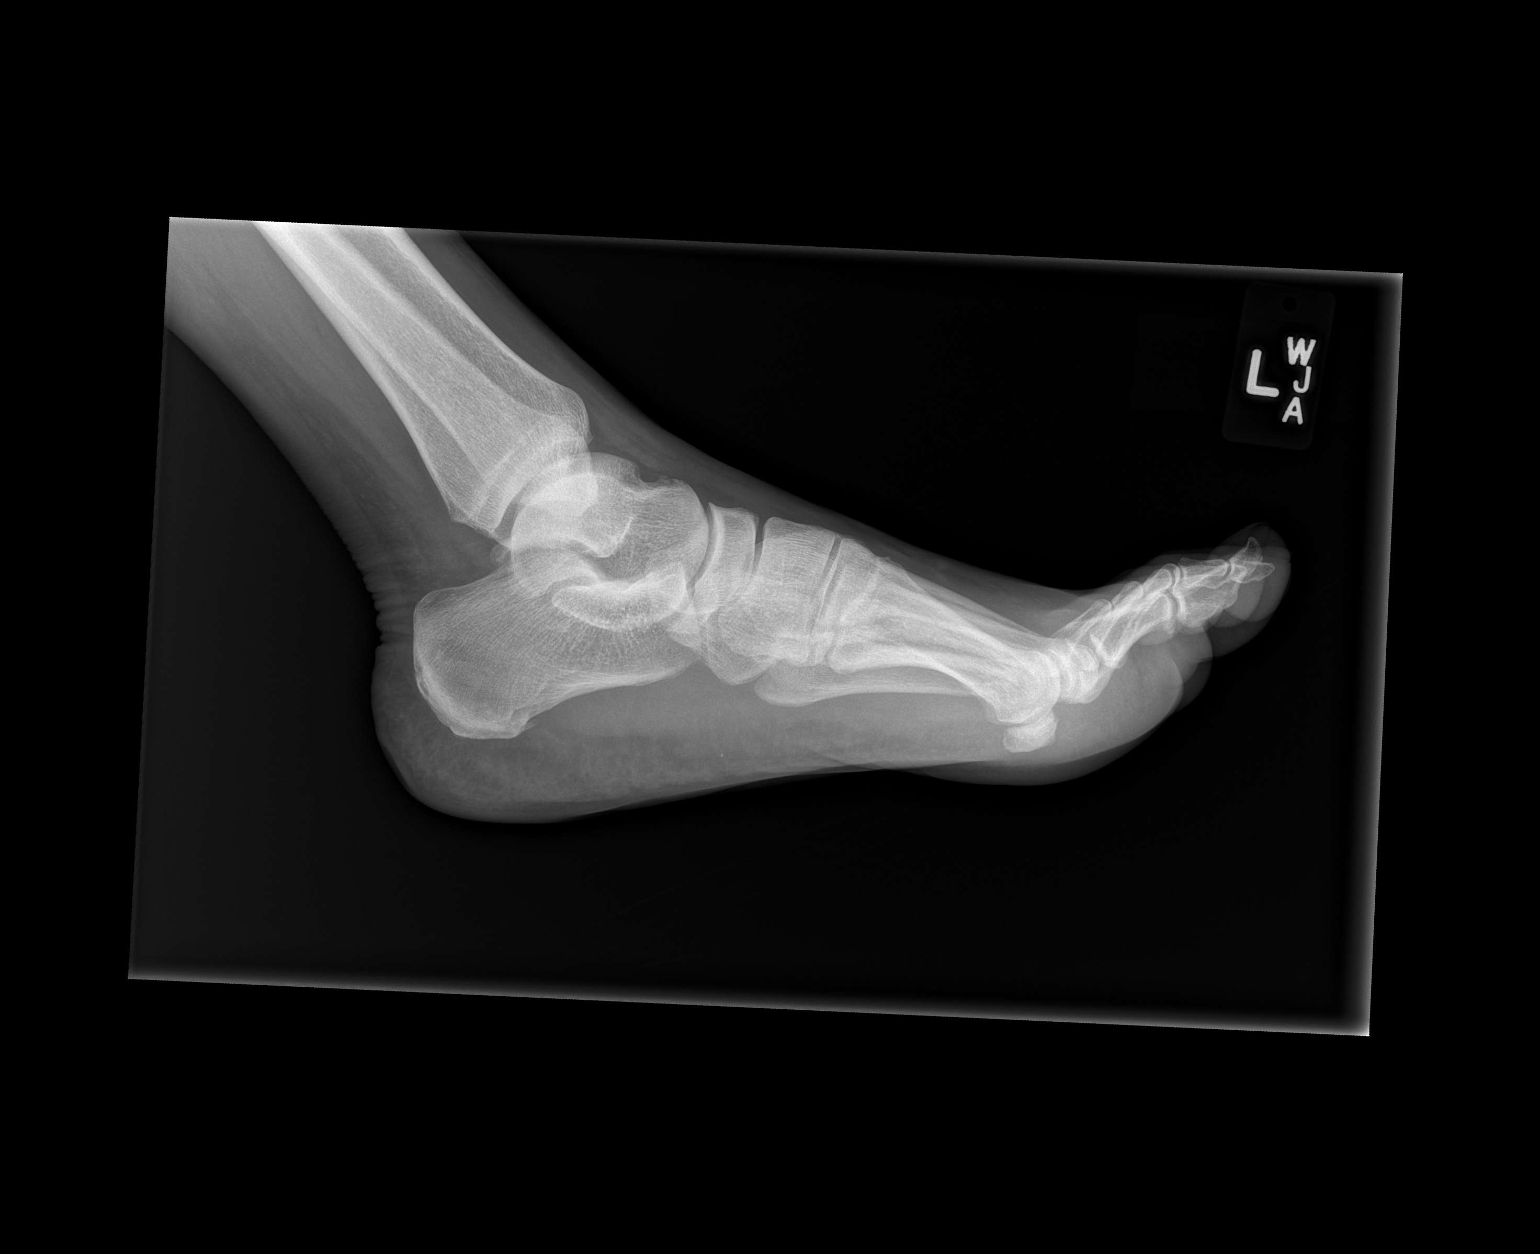

[3 of 3 positions shown; findings below may reference images not displayed]

FINDINGS: Mild degenerative change of the first MTP joint. No evidence of
fracture dislocation no significant soft tissue abnormality.
IMPRESSION: No acute findings.

## 2015-10-03 DIAGNOSIS — Z21 Asymptomatic human immunodeficiency virus [HIV] infection status: Secondary | ICD-10-CM | POA: Diagnosis not present

## 2015-10-04 DIAGNOSIS — Z21 Asymptomatic human immunodeficiency virus [HIV] infection status: Secondary | ICD-10-CM | POA: Diagnosis not present

## 2015-10-05 DIAGNOSIS — Z21 Asymptomatic human immunodeficiency virus [HIV] infection status: Secondary | ICD-10-CM | POA: Diagnosis not present

## 2015-10-06 DIAGNOSIS — R45851 Suicidal ideations: Secondary | ICD-10-CM | POA: Diagnosis not present

## 2015-10-11 ENCOUNTER — Encounter: Payer: Self-pay | Admitting: Internal Medicine

## 2015-10-12 DIAGNOSIS — Z981 Arthrodesis status: Secondary | ICD-10-CM | POA: Diagnosis not present

## 2015-10-12 DIAGNOSIS — M542 Cervicalgia: Secondary | ICD-10-CM | POA: Diagnosis not present

## 2015-10-12 DIAGNOSIS — M79602 Pain in left arm: Secondary | ICD-10-CM | POA: Diagnosis not present

## 2015-10-12 DIAGNOSIS — G8929 Other chronic pain: Secondary | ICD-10-CM | POA: Diagnosis not present

## 2015-10-12 DIAGNOSIS — M50323 Other cervical disc degeneration at C6-C7 level: Secondary | ICD-10-CM | POA: Diagnosis not present

## 2015-10-19 DIAGNOSIS — Z981 Arthrodesis status: Secondary | ICD-10-CM | POA: Diagnosis not present

## 2015-10-19 DIAGNOSIS — M4802 Spinal stenosis, cervical region: Secondary | ICD-10-CM | POA: Diagnosis not present

## 2015-10-19 DIAGNOSIS — M5003 Cervical disc disorder with myelopathy, cervicothoracic region: Secondary | ICD-10-CM | POA: Diagnosis not present

## 2015-10-19 DIAGNOSIS — Z7982 Long term (current) use of aspirin: Secondary | ICD-10-CM | POA: Diagnosis not present

## 2015-10-19 DIAGNOSIS — Z9889 Other specified postprocedural states: Secondary | ICD-10-CM | POA: Diagnosis not present

## 2015-10-19 DIAGNOSIS — M5001 Cervical disc disorder with myelopathy,  high cervical region: Secondary | ICD-10-CM | POA: Diagnosis not present

## 2015-10-20 DIAGNOSIS — G992 Myelopathy in diseases classified elsewhere: Secondary | ICD-10-CM | POA: Insufficient documentation

## 2015-10-20 DIAGNOSIS — M4802 Spinal stenosis, cervical region: Secondary | ICD-10-CM | POA: Insufficient documentation

## 2015-10-21 DIAGNOSIS — F1410 Cocaine abuse, uncomplicated: Secondary | ICD-10-CM | POA: Diagnosis not present

## 2015-10-21 DIAGNOSIS — R062 Wheezing: Secondary | ICD-10-CM | POA: Diagnosis not present

## 2015-10-21 DIAGNOSIS — I2510 Atherosclerotic heart disease of native coronary artery without angina pectoris: Secondary | ICD-10-CM | POA: Diagnosis not present

## 2015-10-21 DIAGNOSIS — Z21 Asymptomatic human immunodeficiency virus [HIV] infection status: Secondary | ICD-10-CM | POA: Diagnosis not present

## 2015-10-21 DIAGNOSIS — Z8679 Personal history of other diseases of the circulatory system: Secondary | ICD-10-CM | POA: Diagnosis not present

## 2015-10-21 DIAGNOSIS — Z7984 Long term (current) use of oral hypoglycemic drugs: Secondary | ICD-10-CM | POA: Diagnosis not present

## 2015-10-21 DIAGNOSIS — I10 Essential (primary) hypertension: Secondary | ICD-10-CM | POA: Diagnosis not present

## 2015-10-21 DIAGNOSIS — R05 Cough: Secondary | ICD-10-CM | POA: Diagnosis not present

## 2015-10-21 DIAGNOSIS — R45851 Suicidal ideations: Secondary | ICD-10-CM | POA: Diagnosis not present

## 2015-10-21 DIAGNOSIS — Z794 Long term (current) use of insulin: Secondary | ICD-10-CM | POA: Diagnosis not present

## 2015-10-21 DIAGNOSIS — J069 Acute upper respiratory infection, unspecified: Secondary | ICD-10-CM | POA: Diagnosis not present

## 2015-10-21 DIAGNOSIS — Z79899 Other long term (current) drug therapy: Secondary | ICD-10-CM | POA: Diagnosis not present

## 2015-10-21 DIAGNOSIS — Z7982 Long term (current) use of aspirin: Secondary | ICD-10-CM | POA: Diagnosis not present

## 2015-10-21 DIAGNOSIS — E119 Type 2 diabetes mellitus without complications: Secondary | ICD-10-CM | POA: Diagnosis not present

## 2015-10-22 DIAGNOSIS — J449 Chronic obstructive pulmonary disease, unspecified: Secondary | ICD-10-CM | POA: Insufficient documentation

## 2015-10-23 DIAGNOSIS — B20 Human immunodeficiency virus [HIV] disease: Secondary | ICD-10-CM | POA: Diagnosis not present

## 2015-10-23 DIAGNOSIS — R45851 Suicidal ideations: Secondary | ICD-10-CM | POA: Diagnosis not present

## 2015-10-23 DIAGNOSIS — E119 Type 2 diabetes mellitus without complications: Secondary | ICD-10-CM | POA: Diagnosis not present

## 2015-10-23 DIAGNOSIS — Z7982 Long term (current) use of aspirin: Secondary | ICD-10-CM | POA: Diagnosis not present

## 2015-10-23 DIAGNOSIS — M549 Dorsalgia, unspecified: Secondary | ICD-10-CM | POA: Diagnosis not present

## 2015-10-23 DIAGNOSIS — F1410 Cocaine abuse, uncomplicated: Secondary | ICD-10-CM | POA: Diagnosis not present

## 2015-10-23 DIAGNOSIS — I2510 Atherosclerotic heart disease of native coronary artery without angina pectoris: Secondary | ICD-10-CM | POA: Diagnosis not present

## 2015-10-23 DIAGNOSIS — Z7984 Long term (current) use of oral hypoglycemic drugs: Secondary | ICD-10-CM | POA: Diagnosis not present

## 2015-10-23 DIAGNOSIS — Z794 Long term (current) use of insulin: Secondary | ICD-10-CM | POA: Diagnosis not present

## 2015-10-23 DIAGNOSIS — Z955 Presence of coronary angioplasty implant and graft: Secondary | ICD-10-CM | POA: Diagnosis not present

## 2015-10-23 DIAGNOSIS — I10 Essential (primary) hypertension: Secondary | ICD-10-CM | POA: Diagnosis not present

## 2015-10-30 NOTE — Progress Notes (Signed)
Late entry to close note- retinal i,ages done this appointment

## 2015-11-04 ENCOUNTER — Emergency Department (HOSPITAL_COMMUNITY)
Admission: EM | Admit: 2015-11-04 | Discharge: 2015-11-04 | Disposition: A | Discharge status: Home / Self Care | Payer: Medicare Other | Attending: Emergency Medicine | Admitting: Emergency Medicine

## 2015-11-04 ENCOUNTER — Emergency Department (HOSPITAL_COMMUNITY): Payer: Medicare Other

## 2015-11-04 ENCOUNTER — Encounter (HOSPITAL_COMMUNITY): Payer: Self-pay | Admitting: *Deleted

## 2015-11-04 DIAGNOSIS — Z7982 Long term (current) use of aspirin: Secondary | ICD-10-CM | POA: Diagnosis not present

## 2015-11-04 DIAGNOSIS — I10 Essential (primary) hypertension: Secondary | ICD-10-CM | POA: Insufficient documentation

## 2015-11-04 DIAGNOSIS — E1165 Type 2 diabetes mellitus with hyperglycemia: Secondary | ICD-10-CM | POA: Diagnosis not present

## 2015-11-04 DIAGNOSIS — Z7984 Long term (current) use of oral hypoglycemic drugs: Secondary | ICD-10-CM | POA: Diagnosis not present

## 2015-11-04 DIAGNOSIS — R739 Hyperglycemia, unspecified: Secondary | ICD-10-CM | POA: Diagnosis not present

## 2015-11-04 DIAGNOSIS — I499 Cardiac arrhythmia, unspecified: Secondary | ICD-10-CM | POA: Insufficient documentation

## 2015-11-04 DIAGNOSIS — E785 Hyperlipidemia, unspecified: Secondary | ICD-10-CM | POA: Diagnosis not present

## 2015-11-04 DIAGNOSIS — I4891 Unspecified atrial fibrillation: Secondary | ICD-10-CM | POA: Insufficient documentation

## 2015-11-04 DIAGNOSIS — M109 Gout, unspecified: Secondary | ICD-10-CM | POA: Insufficient documentation

## 2015-11-04 DIAGNOSIS — R600 Localized edema: Secondary | ICD-10-CM | POA: Insufficient documentation

## 2015-11-04 DIAGNOSIS — M7989 Other specified soft tissue disorders: Secondary | ICD-10-CM

## 2015-11-04 DIAGNOSIS — R7309 Other abnormal glucose: Secondary | ICD-10-CM | POA: Diagnosis not present

## 2015-11-04 DIAGNOSIS — Z872 Personal history of diseases of the skin and subcutaneous tissue: Secondary | ICD-10-CM | POA: Diagnosis not present

## 2015-11-04 DIAGNOSIS — I2510 Atherosclerotic heart disease of native coronary artery without angina pectoris: Secondary | ICD-10-CM | POA: Diagnosis not present

## 2015-11-04 DIAGNOSIS — Z794 Long term (current) use of insulin: Secondary | ICD-10-CM | POA: Insufficient documentation

## 2015-11-04 DIAGNOSIS — J45901 Unspecified asthma with (acute) exacerbation: Secondary | ICD-10-CM | POA: Diagnosis not present

## 2015-11-04 DIAGNOSIS — F329 Major depressive disorder, single episode, unspecified: Secondary | ICD-10-CM | POA: Insufficient documentation

## 2015-11-04 DIAGNOSIS — R05 Cough: Secondary | ICD-10-CM | POA: Diagnosis not present

## 2015-11-04 DIAGNOSIS — Z21 Asymptomatic human immunodeficiency virus [HIV] infection status: Secondary | ICD-10-CM | POA: Diagnosis not present

## 2015-11-04 DIAGNOSIS — Z79899 Other long term (current) drug therapy: Secondary | ICD-10-CM | POA: Insufficient documentation

## 2015-11-04 DIAGNOSIS — Z791 Long term (current) use of non-steroidal anti-inflammatories (NSAID): Secondary | ICD-10-CM | POA: Insufficient documentation

## 2015-11-04 DIAGNOSIS — R0602 Shortness of breath: Secondary | ICD-10-CM | POA: Diagnosis not present

## 2015-11-04 HISTORY — DX: Unspecified atrial fibrillation: I48.91

## 2015-11-04 LAB — BASIC METABOLIC PANEL
ANION GAP: 13 (ref 5–15)
BUN: 18 mg/dL (ref 6–20)
CO2: 23 mmol/L (ref 22–32)
Calcium: 9.3 mg/dL (ref 8.9–10.3)
Chloride: 101 mmol/L (ref 101–111)
Creatinine, Ser: 1.46 mg/dL — ABNORMAL HIGH (ref 0.61–1.24)
GFR calc Af Amer: 56 mL/min — ABNORMAL LOW (ref >60)
GFR, EST NON AFRICAN AMERICAN: 49 mL/min — AB (ref >60)
Glucose, Bld: 457 mg/dL — ABNORMAL HIGH (ref 65–99)
POTASSIUM: 4.6 mmol/L (ref 3.5–5.1)
SODIUM: 137 mmol/L (ref 135–145)

## 2015-11-04 LAB — CBC
HEMATOCRIT: 34.8 % — AB (ref 39.0–52.0)
HEMOGLOBIN: 10.7 g/dL — AB (ref 13.0–17.0)
MCH: 25.9 pg — ABNORMAL LOW (ref 26.0–34.0)
MCHC: 30.7 g/dL (ref 30.0–36.0)
MCV: 84.3 fL (ref 78.0–100.0)
Platelets: 184 10*3/uL (ref 150–400)
RBC: 4.13 MIL/uL — ABNORMAL LOW (ref 4.22–5.81)
RDW: 14.9 % (ref 11.5–15.5)
WBC: 4.1 10*3/uL (ref 4.0–10.5)

## 2015-11-04 LAB — URINALYSIS, ROUTINE W REFLEX MICROSCOPIC
Bilirubin Urine: NEGATIVE
Glucose, UA: >1000 mg/dL — AB
Hgb urine dipstick: NEGATIVE
KETONES UR: NEGATIVE mg/dL
LEUKOCYTES UA: NEGATIVE
NITRITE: NEGATIVE
PH: 7 (ref 5.0–8.0)
PROTEIN: NEGATIVE mg/dL
Specific Gravity, Urine: 1.025 (ref 1.005–1.030)

## 2015-11-04 LAB — HEPATIC FUNCTION PANEL
ALBUMIN: 3.6 g/dL (ref 3.5–5.0)
ALT: 14 U/L — ABNORMAL LOW (ref 17–63)
AST: 18 U/L (ref 15–41)
Alkaline Phosphatase: 74 U/L (ref 38–126)
BILIRUBIN TOTAL: 0.5 mg/dL (ref 0.3–1.2)
Bilirubin, Direct: <0.1 mg/dL — ABNORMAL LOW (ref 0.1–0.5)
TOTAL PROTEIN: 7.5 g/dL (ref 6.5–8.1)

## 2015-11-04 LAB — CBG MONITORING, ED
GLUCOSE-CAPILLARY: 397 mg/dL — AB (ref 65–99)
GLUCOSE-CAPILLARY: 247 mg/dL — AB (ref 65–99)

## 2015-11-04 LAB — URINE MICROSCOPIC-ADD ON: BACTERIA UA: NONE SEEN

## 2015-11-04 MED ORDER — SODIUM CHLORIDE 0.9 % IV BOLUS (SEPSIS)
1000.0000 mL | Freq: Once | INTRAVENOUS | Status: AC
Start: 2015-11-04 — End: 2015-11-04
  Administered 2015-11-04: 1000 mL via INTRAVENOUS

## 2015-11-04 NOTE — Discharge Instructions (Signed)
Please read and follow all provided instructions.  Your diagnoses today include:  1. Hyperglycemia   2. Swelling of lower extremity     Tests performed today include:  Blood counts and electrolytes - high blood sugar  Chest x-ray - no pneumonia or fluid  Urine test  Vital signs. See below for your results today.   Medications prescribed:   None  Take any prescribed medications only as directed.  Home care instructions:  Follow any educational materials contained in this packet.  BE VERY CAREFUL not to take multiple medicines containing Tylenol (also called acetaminophen). Doing so can lead to an overdose which can damage your liver and cause liver failure and possibly death.   Follow-up instructions: Please follow-up with your primary care provider in the next 3 days for further evaluation of your symptoms.   Return instructions:   Please return to the Emergency Department if you experience worsening symptoms.   Please return with shortness of breath, chest pain  Please return if you have any other emergent concerns.  Additional Information:  Your vital signs today were: BP 148/83 mmHg   Pulse 61   Temp(Src) 98.1 F (36.7 C) (Oral)   Resp 13   Ht 5' 7.5" (1.715 m)   Wt 82.555 kg   BMI 28.07 kg/m2   SpO2 100% If your blood pressure (BP) was elevated above 135/85 this visit, please have this repeated by your doctor within one month. --------------

## 2015-11-04 NOTE — ED Notes (Signed)
Pt given bus pass and [place] sandwich & beverage prior to leaving.

## 2015-11-04 NOTE — ED Notes (Signed)
Pt verbalized understanding of d/c instructions and follow-up care. No further questions/concerns, VSS, ambulatory w/ steady gait (refused wheelchair) 
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
## 2015-11-04 NOTE — ED Provider Notes (Signed)
CSN: [id number]     Arrival date & time 11/04/15  0831 History   First MD Initiated Contact with Patient 11/04/15 [phone]     Chief Complaint  Patient presents with  . Edema  . Hyperglycemia     (Consider location/radiation/quality/duration/timing/severity/associated sxs/prior Treatment) HPI Comments: Patient with history of HIV (CD4 in 300's in 03/2015), depression/substance abuse (cocaine), CAD status post cardiac stents, diabetes, cervical radiculopathy -- presents today with chief complaint of bilateral lower extremity swelling over the past 2 weeks. Patient states that the swelling is slightly worse in the left. No significant lower extremity pain. Patient states that sometimes he feels short of breath and reports decreased exercise tolerance over the past 6 months or so. He denies any new chest pain. No significant orthopnea. Patient denies a history of congestive heart failure, liver problems, kidney problems. No current blood thinner use other than aspirin (patient states he was on Plavix in the past but has not taken this for some time). No history of blood clots. The onset of this condition was acute. The course is constant. Aggravating factors: none. Alleviating factors: none.    Patient is a 65 y.o. male presenting with hyperglycemia. The history is provided by the patient.  Hyperglycemia Associated symptoms: shortness of breath   Associated symptoms: no abdominal pain, no chest pain, no diaphoresis, no dysuria, no fever, no nausea and no vomiting     Past Medical History  Diagnosis Date  . Diabetes mellitus     Diagnosed in 2002, started insulin in 2012  . Hypertension   . Headache(784.0)     CT head 08/2011: Periventricular and subcortical white matter hypodensities are most in keeping with chronic microangiopathic change  . Gout   . Hyperlipidemia   . HIV infection [facility]) Nov 2012    Followed by [name]  . Crack cocaine use     for 20+ years, has been enrolled in detox  programs in the past  . Chondromalacia of medial femoral condyle     Left knee MRI 04/28/12: Chondromalacia of the medial femoral condyle with slight peripheral degeneration of the meniscocapsular junction of the medial meniscus; followed by sports medicine  . Asthma     No PFTs, history of childhood asthma  . Depression     with history of hospitalization for suicidal ideation  . CAD (coronary artery disease)     a. 06/2013 STEMI/PCI ([facility]): LAD w/ thrombus (treated with BMS), mid 75%, D2 75%; LCX OM2 75%; RCA small, PDA 95%, PLV 95%;  b. 10/2013 Cath/PCI: ISR w/in LAD (Promus DES x 2), borderline OM2 lesion;  c. 01/2014 MV: Intermediate risk, medium-sized distal ant wall infarct w/ very small amt of peri-infarct ischemia. EF 60%.  . Gout 04/28/2012  . Collagen vascular disease ([place])   . Cellulitis 04/2014    left facial  . A-fib [facility])    Past Surgical History  Procedure Laterality Date  . Bowel resection    . Back surgery      1988  . Cardiac surgery    . Cervical spine surgery      " rods in my neck "  . Coronary stent placement    . Nm myocar perf wall motion  12/27/2011    normal  . Spine surgery     Family History  Problem Relation Age of Onset  . Diabetes Mother   . Hypertension Mother   . Hyperlipidemia Mother   . Diabetes Father   . Cancer Father   .  Hypertension Father   . Diabetes Brother   . Heart disease Brother   . Colon cancer Neg Hx   . Diabetes Sister    Social History  Substance Use Topics  . Smoking status: Never Smoker   . Smokeless tobacco: Never Used  . Alcohol Use: No    Review of Systems  Constitutional: Negative for fever and diaphoresis.  Eyes: Negative for redness.  Respiratory: Positive for shortness of breath. Negative for cough.   Cardiovascular: Positive for leg swelling. Negative for chest pain and palpitations.  Gastrointestinal: Negative for nausea, vomiting and abdominal pain.  Genitourinary: Negative for dysuria.  Musculoskeletal:  Negative for back pain and neck pain.  Skin: Negative for color change and rash.  Neurological: Negative for syncope and light-headedness.  Psychiatric/Behavioral: The patient is not nervous/anxious.     Allergies  Review of patient's allergies indicates no known allergies.  Home Medications   Prior to Admission medications   Medication Sig Start Date End Date Taking? Authorizing Provider  allopurinol (ZYLOPRIM) 100 MG tablet Take 2 tablets (200 mg total) by mouth daily. For gouty arthritis 07/10/15   Liberty Handy, MD  amantadine (SYMMETREL) 100 MG capsule Take 1 capsule by mouth daily. 09/22/12   Historical Provider, MD  aspirin EC 81 MG tablet Take 1 tablet (81 mg total) by mouth daily. For heart health 09/10/15   Benjamine Mola, FNP  clopidogrel (PLAVIX) 75 MG tablet Take 1 tablet (75 mg total) by mouth daily. For prevention of stroke 09/10/15   Benjamine Mola, FNP  cyclobenzaprine (FLEXERIL) 10 MG tablet Take 1 tablet (10 mg total) by mouth 2 (two) times daily as needed for muscle spasms. 09/05/15   Courteney Lyn Mackuen, MD  diltiazem (CARDIZEM CD) 120 MG 24 hr capsule Take 1 capsule by mouth daily. 06/13/13   Historical Provider, MD  DULoxetine (CYMBALTA) 30 MG capsule Take 1 capsule (30 mg total) by mouth daily. 09/10/15   Benjamine Mola, FNP  emtricitabine-rilpivir-tenofovir DF (COMPLERA) 200-25-300 MG tablet Take 1 tablet by mouth daily. 09/26/15   Delfin Gant, NP  famotidine (PEPCID) 20 MG tablet Take 1 tablet (20 mg total) by mouth daily. 8pm 09/10/15   Benjamine Mola, FNP  gabapentin (NEURONTIN) 300 MG capsule Take 1 capsule by mouth daily.    Historical Provider, MD  Insulin Glargine (LANTUS SOLOSTAR) 100 UNIT/ML Solostar Pen Inject 15 Units into the skin at bedtime. 09/10/15   Benjamine Mola, FNP  isosorbide mononitrate (IMDUR) 30 MG 24 hr tablet TAKE 1 TABLET (30 MG TOTAL) BY MOUTH DAILY. 09/10/15   Benjamine Mola, FNP  losartan (COZAAR) 50 MG tablet Take 1 tablet by mouth daily.  08/15/15   Historical Provider, MD  metFORMIN (GLUCOPHAGE) 1000 MG tablet Take 1 tablet (1,000 mg total) by mouth 2 (two) times daily with a meal. For diabetes management 12/20/14   Encarnacion Slates, NP  naproxen (NAPROSYN) 500 MG tablet Take 1 tablet (500 mg total) by mouth 2 (two) times daily. 09/10/15   Benjamine Mola, FNP  polyethylene glycol [facility]) packet Take 17 g by mouth daily. 09/10/15   Benjamine Mola, FNP  pravastatin (PRAVACHOL) 40 MG tablet Take 1 tablet (40 mg total) by mouth daily. For high cholesterol 12/20/14   Encarnacion Slates, NP  PROAIR HFA 108 (90 BASE) MCG/ACT inhaler INHALE TWO   PUFFS INTO THE LUNGS EVERY 6 (SIX) HOURS AS NEEDED FOR WHEEZING OR SHORTNESS OF BREATH. 09/04/15   Azucena Cecil  Hulen Luster, MD   BP 147/81 mmHg  Pulse 79  Temp(Src) 98.1 F (36.7 C) (Oral)  Resp 18  Ht 5' 7.5" (1.715 m)  Wt 82.555 kg  BMI 28.07 kg/m2  SpO2 100%   Physical Exam  Constitutional: He appears well-developed and well-nourished.  HENT:  Head: Normocephalic and atraumatic.  Eyes: Conjunctivae are normal. Right eye exhibits no discharge. Left eye exhibits no discharge.  Neck: Normal range of motion. Neck supple.  Cardiovascular: Normal rate and normal heart sounds.  An irregularly irregular rhythm present.  No murmur heard. Pulmonary/Chest: Effort normal and breath sounds normal. No respiratory distress. He has no wheezes. He has no rales.  Abdominal: Soft. There is no tenderness.  Musculoskeletal: He exhibits edema. He exhibits no tenderness.  1+ non-pitting edema noted to bilateral lower extremities to mid-ankles. No cellulitis. No calf tenderness.   Neurological: He is alert.  Skin: Skin is warm and dry.  Psychiatric: He has a normal mood and affect.  Nursing note and vitals reviewed.   ED Course  Procedures (including critical care time) Labs Review Labs Reviewed  BASIC METABOLIC PANEL - Abnormal; Notable for the following:    Glucose, Bld 457 (*)    Creatinine, Ser 1.46 (*)    GFR  calc non Af Amer 49 (*)    GFR calc Af Amer 56 (*)    All other components within normal limits  CBC - Abnormal; Notable for the following:    RBC 4.13 (*)    Hemoglobin 10.7 (*)    HCT 34.8 (*)    MCH 25.9 (*)    All other components within normal limits  URINALYSIS, ROUTINE W REFLEX MICROSCOPIC (NOT AT [facility]) - Abnormal; Notable for the following:    Glucose, UA >1000 (*)    All other components within normal limits  HEPATIC FUNCTION PANEL - Abnormal; Notable for the following:    ALT 14 (*)    Bilirubin, Direct <0.1 (*)    All other components within normal limits  URINE MICROSCOPIC-ADD ON - Abnormal; Notable for the following:    Squamous Epithelial / LPF 0-5 (*)    All other components within normal limits  CBG MONITORING, ED - Abnormal; Notable for the following:    Glucose-Capillary 397 (*)    All other components within normal limits  CBG MONITORING, ED - Abnormal; Notable for the following:    Glucose-Capillary 247 (*)    All other components within normal limits    Imaging Review No results found. I have personally reviewed and evaluated these images and lab results as part of my medical decision-making.   EKG Interpretation None       9:05 AM Patient seen and examined. Work-up initiated. Medications ordered.   Vital signs reviewed and are as follows: BP 147/81 mmHg  Pulse 79  Temp(Src) 98.1 F (36.7 C) (Oral)  Resp 18  Ht 5' 7.5" (1.715 m)  Wt 82.555 kg  BMI 28.07 kg/m2  SpO2 100%  1:00 PM Patient seen previously by Dr. Johnney Killian.   Patient has done well during ED stay.   Labs are baseline, reviewed with patient. Encouraged PCP f/u.   CBG improved into 200's with fluid bolus. Patient encouraged to take his medications.   Patient urged to return with worsening symptoms or other concerns. Patient verbalized understanding and agrees with plan.    MDM   Final diagnoses:  Hyperglycemia  Swelling of lower extremity   Lower extremity swelling: Doubt  heart failure, liver failure,  nephrotic syndrome. Unlikely to be DVT given appearance and that it is bilateral. Feel that patient can be discharged home with PCP follow-up for this problem. No signs of infection or cellulitis.  Hyperglycemia: No ketosis. Treated with fluids with good response.    Carlisle Cater, PA-C 11/04/15 1303  Charlesetta Shanks, MD 11/12/15 [phone]

## 2015-11-10 DIAGNOSIS — M5442 Lumbago with sciatica, left side: Secondary | ICD-10-CM | POA: Diagnosis not present

## 2015-11-10 DIAGNOSIS — I480 Paroxysmal atrial fibrillation: Secondary | ICD-10-CM | POA: Diagnosis not present

## 2015-11-10 DIAGNOSIS — Z01818 Encounter for other preprocedural examination: Secondary | ICD-10-CM | POA: Diagnosis not present

## 2015-11-10 DIAGNOSIS — G8929 Other chronic pain: Secondary | ICD-10-CM | POA: Diagnosis not present

## 2015-11-10 DIAGNOSIS — Z981 Arthrodesis status: Secondary | ICD-10-CM | POA: Diagnosis not present

## 2015-11-15 DIAGNOSIS — I4891 Unspecified atrial fibrillation: Secondary | ICD-10-CM | POA: Diagnosis not present

## 2015-11-15 DIAGNOSIS — R1310 Dysphagia, unspecified: Secondary | ICD-10-CM | POA: Diagnosis not present

## 2015-11-15 DIAGNOSIS — M4802 Spinal stenosis, cervical region: Secondary | ICD-10-CM | POA: Diagnosis not present

## 2015-11-15 DIAGNOSIS — Z981 Arthrodesis status: Secondary | ICD-10-CM | POA: Diagnosis not present

## 2015-11-15 DIAGNOSIS — M2578 Osteophyte, vertebrae: Secondary | ICD-10-CM | POA: Diagnosis not present

## 2015-11-15 DIAGNOSIS — E785 Hyperlipidemia, unspecified: Secondary | ICD-10-CM | POA: Diagnosis not present

## 2015-11-15 DIAGNOSIS — J449 Chronic obstructive pulmonary disease, unspecified: Secondary | ICD-10-CM | POA: Diagnosis not present

## 2015-11-15 DIAGNOSIS — Z7982 Long term (current) use of aspirin: Secondary | ICD-10-CM | POA: Diagnosis not present

## 2015-11-15 DIAGNOSIS — Z7902 Long term (current) use of antithrombotics/antiplatelets: Secondary | ICD-10-CM | POA: Diagnosis not present

## 2015-11-15 DIAGNOSIS — R262 Difficulty in walking, not elsewhere classified: Secondary | ICD-10-CM | POA: Diagnosis not present

## 2015-11-15 DIAGNOSIS — J4520 Mild intermittent asthma, uncomplicated: Secondary | ICD-10-CM | POA: Diagnosis not present

## 2015-11-15 DIAGNOSIS — I2510 Atherosclerotic heart disease of native coronary artery without angina pectoris: Secondary | ICD-10-CM | POA: Diagnosis not present

## 2015-11-15 DIAGNOSIS — G8929 Other chronic pain: Secondary | ICD-10-CM | POA: Diagnosis not present

## 2015-11-15 DIAGNOSIS — M545 Low back pain: Secondary | ICD-10-CM | POA: Diagnosis not present

## 2015-11-15 DIAGNOSIS — Z794 Long term (current) use of insulin: Secondary | ICD-10-CM | POA: Diagnosis not present

## 2015-11-15 DIAGNOSIS — I252 Old myocardial infarction: Secondary | ICD-10-CM | POA: Diagnosis not present

## 2015-11-15 DIAGNOSIS — M4712 Other spondylosis with myelopathy, cervical region: Secondary | ICD-10-CM | POA: Diagnosis not present

## 2015-11-15 DIAGNOSIS — M532X2 Spinal instabilities, cervical region: Secondary | ICD-10-CM | POA: Diagnosis not present

## 2015-11-15 DIAGNOSIS — J45909 Unspecified asthma, uncomplicated: Secondary | ICD-10-CM | POA: Diagnosis not present

## 2015-11-15 DIAGNOSIS — Z955 Presence of coronary angioplasty implant and graft: Secondary | ICD-10-CM | POA: Diagnosis not present

## 2015-11-15 DIAGNOSIS — E1165 Type 2 diabetes mellitus with hyperglycemia: Secondary | ICD-10-CM | POA: Diagnosis not present

## 2015-11-15 DIAGNOSIS — I10 Essential (primary) hypertension: Secondary | ICD-10-CM | POA: Diagnosis not present

## 2015-11-15 DIAGNOSIS — Z9889 Other specified postprocedural states: Secondary | ICD-10-CM | POA: Insufficient documentation

## 2015-11-15 DIAGNOSIS — R000 Tachycardia, unspecified: Secondary | ICD-10-CM | POA: Diagnosis not present

## 2015-11-15 DIAGNOSIS — M50822 Other cervical disc disorders at C5-C6 level: Secondary | ICD-10-CM | POA: Diagnosis not present

## 2015-11-15 DIAGNOSIS — M109 Gout, unspecified: Secondary | ICD-10-CM | POA: Diagnosis not present

## 2015-11-27 DIAGNOSIS — I2510 Atherosclerotic heart disease of native coronary artery without angina pectoris: Secondary | ICD-10-CM | POA: Diagnosis not present

## 2015-11-27 DIAGNOSIS — I10 Essential (primary) hypertension: Secondary | ICD-10-CM | POA: Diagnosis not present

## 2015-11-27 DIAGNOSIS — Z7982 Long term (current) use of aspirin: Secondary | ICD-10-CM | POA: Diagnosis not present

## 2015-11-27 DIAGNOSIS — Z7902 Long term (current) use of antithrombotics/antiplatelets: Secondary | ICD-10-CM | POA: Diagnosis not present

## 2015-11-27 DIAGNOSIS — Z981 Arthrodesis status: Secondary | ICD-10-CM | POA: Diagnosis not present

## 2015-11-27 DIAGNOSIS — Z4800 Encounter for change or removal of nonsurgical wound dressing: Secondary | ICD-10-CM | POA: Diagnosis not present

## 2015-11-27 DIAGNOSIS — Z7984 Long term (current) use of oral hypoglycemic drugs: Secondary | ICD-10-CM | POA: Diagnosis not present

## 2015-11-27 DIAGNOSIS — E119 Type 2 diabetes mellitus without complications: Secondary | ICD-10-CM | POA: Diagnosis not present

## 2015-11-27 DIAGNOSIS — Z5189 Encounter for other specified aftercare: Secondary | ICD-10-CM | POA: Diagnosis not present

## 2015-11-27 DIAGNOSIS — Z79899 Other long term (current) drug therapy: Secondary | ICD-10-CM | POA: Diagnosis not present

## 2015-11-27 DIAGNOSIS — Z794 Long term (current) use of insulin: Secondary | ICD-10-CM | POA: Diagnosis not present

## 2015-12-08 DIAGNOSIS — Z79891 Long term (current) use of opiate analgesic: Secondary | ICD-10-CM | POA: Diagnosis not present

## 2015-12-08 DIAGNOSIS — I6521 Occlusion and stenosis of right carotid artery: Secondary | ICD-10-CM | POA: Diagnosis not present

## 2015-12-08 DIAGNOSIS — Z9119 Patient's noncompliance with other medical treatment and regimen: Secondary | ICD-10-CM | POA: Diagnosis not present

## 2015-12-08 DIAGNOSIS — Z749 Problem related to care provider dependency, unspecified: Secondary | ICD-10-CM | POA: Diagnosis not present

## 2015-12-08 DIAGNOSIS — M4800 Spinal stenosis, site unspecified: Secondary | ICD-10-CM | POA: Diagnosis not present

## 2015-12-08 DIAGNOSIS — Z981 Arthrodesis status: Secondary | ICD-10-CM | POA: Diagnosis not present

## 2015-12-08 DIAGNOSIS — Z7902 Long term (current) use of antithrombotics/antiplatelets: Secondary | ICD-10-CM | POA: Diagnosis not present

## 2015-12-08 DIAGNOSIS — E1165 Type 2 diabetes mellitus with hyperglycemia: Secondary | ICD-10-CM | POA: Diagnosis not present

## 2015-12-08 DIAGNOSIS — I10 Essential (primary) hypertension: Secondary | ICD-10-CM | POA: Diagnosis not present

## 2015-12-08 DIAGNOSIS — M4312 Spondylolisthesis, cervical region: Secondary | ICD-10-CM | POA: Diagnosis not present

## 2015-12-08 DIAGNOSIS — M542 Cervicalgia: Secondary | ICD-10-CM | POA: Diagnosis not present

## 2015-12-08 DIAGNOSIS — R609 Edema, unspecified: Secondary | ICD-10-CM | POA: Diagnosis not present

## 2015-12-08 DIAGNOSIS — Z794 Long term (current) use of insulin: Secondary | ICD-10-CM | POA: Diagnosis not present

## 2015-12-08 DIAGNOSIS — M5412 Radiculopathy, cervical region: Secondary | ICD-10-CM | POA: Diagnosis not present

## 2015-12-08 DIAGNOSIS — E785 Hyperlipidemia, unspecified: Secondary | ICD-10-CM | POA: Diagnosis not present

## 2015-12-08 DIAGNOSIS — Z79899 Other long term (current) drug therapy: Secondary | ICD-10-CM | POA: Diagnosis not present

## 2015-12-08 DIAGNOSIS — I2510 Atherosclerotic heart disease of native coronary artery without angina pectoris: Secondary | ICD-10-CM | POA: Diagnosis not present

## 2015-12-08 DIAGNOSIS — Z7982 Long term (current) use of aspirin: Secondary | ICD-10-CM | POA: Diagnosis not present

## 2015-12-08 DIAGNOSIS — M79602 Pain in left arm: Secondary | ICD-10-CM | POA: Diagnosis not present

## 2015-12-08 DIAGNOSIS — Z955 Presence of coronary angioplasty implant and graft: Secondary | ICD-10-CM | POA: Diagnosis not present

## 2015-12-15 DIAGNOSIS — Z7982 Long term (current) use of aspirin: Secondary | ICD-10-CM | POA: Diagnosis not present

## 2015-12-15 DIAGNOSIS — I2510 Atherosclerotic heart disease of native coronary artery without angina pectoris: Secondary | ICD-10-CM | POA: Diagnosis not present

## 2015-12-15 DIAGNOSIS — E1165 Type 2 diabetes mellitus with hyperglycemia: Secondary | ICD-10-CM | POA: Diagnosis not present

## 2015-12-15 DIAGNOSIS — E785 Hyperlipidemia, unspecified: Secondary | ICD-10-CM | POA: Diagnosis not present

## 2015-12-15 DIAGNOSIS — R05 Cough: Secondary | ICD-10-CM | POA: Diagnosis not present

## 2015-12-15 DIAGNOSIS — Z794 Long term (current) use of insulin: Secondary | ICD-10-CM | POA: Diagnosis not present

## 2015-12-15 DIAGNOSIS — Z955 Presence of coronary angioplasty implant and graft: Secondary | ICD-10-CM | POA: Diagnosis not present

## 2015-12-15 DIAGNOSIS — E1122 Type 2 diabetes mellitus with diabetic chronic kidney disease: Secondary | ICD-10-CM | POA: Diagnosis not present

## 2015-12-15 DIAGNOSIS — F609 Personality disorder, unspecified: Secondary | ICD-10-CM | POA: Diagnosis not present

## 2015-12-15 DIAGNOSIS — N183 Chronic kidney disease, stage 3 (moderate): Secondary | ICD-10-CM | POA: Diagnosis not present

## 2015-12-15 DIAGNOSIS — F1420 Cocaine dependence, uncomplicated: Secondary | ICD-10-CM | POA: Diagnosis not present

## 2015-12-15 DIAGNOSIS — R509 Fever, unspecified: Secondary | ICD-10-CM | POA: Diagnosis not present

## 2015-12-15 DIAGNOSIS — R531 Weakness: Secondary | ICD-10-CM | POA: Diagnosis not present

## 2015-12-15 DIAGNOSIS — E109 Type 1 diabetes mellitus without complications: Secondary | ICD-10-CM | POA: Diagnosis not present

## 2015-12-15 DIAGNOSIS — Z7902 Long term (current) use of antithrombotics/antiplatelets: Secondary | ICD-10-CM | POA: Diagnosis not present

## 2015-12-15 DIAGNOSIS — Z7951 Long term (current) use of inhaled steroids: Secondary | ICD-10-CM | POA: Diagnosis not present

## 2015-12-15 DIAGNOSIS — Z79899 Other long term (current) drug therapy: Secondary | ICD-10-CM | POA: Diagnosis not present

## 2015-12-15 DIAGNOSIS — I129 Hypertensive chronic kidney disease with stage 1 through stage 4 chronic kidney disease, or unspecified chronic kidney disease: Secondary | ICD-10-CM | POA: Diagnosis not present

## 2015-12-15 DIAGNOSIS — F332 Major depressive disorder, recurrent severe without psychotic features: Secondary | ICD-10-CM | POA: Diagnosis not present

## 2015-12-15 DIAGNOSIS — R45851 Suicidal ideations: Secondary | ICD-10-CM | POA: Diagnosis not present

## 2015-12-15 DIAGNOSIS — R739 Hyperglycemia, unspecified: Secondary | ICD-10-CM | POA: Diagnosis not present

## 2015-12-15 DIAGNOSIS — F1994 Other psychoactive substance use, unspecified with psychoactive substance-induced mood disorder: Secondary | ICD-10-CM | POA: Diagnosis not present

## 2015-12-15 DIAGNOSIS — Z9119 Patient's noncompliance with other medical treatment and regimen: Secondary | ICD-10-CM | POA: Diagnosis not present

## 2015-12-15 DIAGNOSIS — Z21 Asymptomatic human immunodeficiency virus [HIV] infection status: Secondary | ICD-10-CM | POA: Diagnosis not present

## 2015-12-15 DIAGNOSIS — Z7984 Long term (current) use of oral hypoglycemic drugs: Secondary | ICD-10-CM | POA: Diagnosis not present

## 2015-12-15 DIAGNOSIS — J111 Influenza due to unidentified influenza virus with other respiratory manifestations: Secondary | ICD-10-CM | POA: Diagnosis not present

## 2015-12-15 DIAGNOSIS — J101 Influenza due to other identified influenza virus with other respiratory manifestations: Secondary | ICD-10-CM | POA: Diagnosis not present

## 2015-12-15 DIAGNOSIS — R7309 Other abnormal glucose: Secondary | ICD-10-CM | POA: Diagnosis not present

## 2015-12-22 ENCOUNTER — Telehealth: Payer: Self-pay | Admitting: Internal Medicine

## 2015-12-22 NOTE — Telephone Encounter (Signed)
APPT. REMINDER CALL, PHONE OFF

## 2015-12-25 ENCOUNTER — Encounter: Payer: Self-pay | Admitting: Internal Medicine

## 2015-12-25 ENCOUNTER — Ambulatory Visit: Payer: Self-pay | Admitting: Internal Medicine

## 2016-01-04 ENCOUNTER — Telehealth: Payer: Self-pay | Admitting: *Deleted

## 2016-01-04 ENCOUNTER — Other Ambulatory Visit: Payer: Self-pay | Admitting: Infectious Diseases

## 2016-01-04 NOTE — Telephone Encounter (Signed)
Patient requesting refill of Complera. Per chart review, patient has not had a refill sent by [facility] since August 2016, had 1 refill sent by IM December 2016.  Per [place], patient fill history is compliant to 08/07/15, then no pick up until 12/13/15. Patient has also had many issues with food insecurity on complera.  He states "I take my medicine, but not like I should."  He had neck surgery at [facility], feels much better.  He has been taking complera "almost daily" since 3/8.   Patient is scheduled to come 4/5 with Dr. Baxter Flattery, as [name] does not have availability until mid May.    Please advise if this refill of complera is appropriate, or if he should wait until seeing Dr. Baxter Flattery 4/5. Landis Gandy, RN

## 2016-01-05 ENCOUNTER — Other Ambulatory Visit: Payer: Self-pay | Admitting: *Deleted

## 2016-01-05 MED ORDER — EMTRICITAB-RILPIVIR-TENOFOV DF 200-25-300 MG PO TABS: 1.0000 | ORAL_TABLET | Freq: Every day | ORAL | Status: DC

## 2016-01-05 NOTE — Telephone Encounter (Signed)
Please refill medicine Have him come in on Monday at 8:45am

## 2016-01-05 NOTE — Telephone Encounter (Signed)
Appointment scheduled for Monday at 9:30 AM. Patient notified and [name] is aware. Michael [name]

## 2016-01-08 ENCOUNTER — Ambulatory Visit: Payer: Medicare Other | Admitting: *Deleted

## 2016-01-08 ENCOUNTER — Ambulatory Visit (INDEPENDENT_AMBULATORY_CARE_PROVIDER_SITE_OTHER): Payer: Medicare Other | Admitting: Infectious Diseases

## 2016-01-08 ENCOUNTER — Encounter: Payer: Self-pay | Admitting: Infectious Diseases

## 2016-01-08 VITALS — BP 160/98 | HR 92 | Temp 98.3°F | Wt 165.0 lb

## 2016-01-08 DIAGNOSIS — F1424 Cocaine dependence with cocaine-induced mood disorder: Secondary | ICD-10-CM

## 2016-01-08 DIAGNOSIS — Z21 Asymptomatic human immunodeficiency virus [HIV] infection status: Secondary | ICD-10-CM

## 2016-01-08 DIAGNOSIS — Z9889 Other specified postprocedural states: Secondary | ICD-10-CM

## 2016-01-08 DIAGNOSIS — F1494 Cocaine use, unspecified with cocaine-induced mood disorder: Secondary | ICD-10-CM | POA: Diagnosis not present

## 2016-01-08 DIAGNOSIS — B20 Human immunodeficiency virus [HIV] disease: Secondary | ICD-10-CM

## 2016-01-08 DIAGNOSIS — E1165 Type 2 diabetes mellitus with hyperglycemia: Secondary | ICD-10-CM

## 2016-01-08 DIAGNOSIS — N183 Chronic kidney disease, stage 3 unspecified: Secondary | ICD-10-CM

## 2016-01-08 DIAGNOSIS — Z113 Encounter for screening for infections with a predominantly sexual mode of transmission: Secondary | ICD-10-CM | POA: Diagnosis not present

## 2016-01-08 DIAGNOSIS — Z79899 Other long term (current) drug therapy: Secondary | ICD-10-CM | POA: Diagnosis not present

## 2016-01-08 DIAGNOSIS — IMO0002 Reserved for concepts with insufficient information to code with codable children: Secondary | ICD-10-CM

## 2016-01-08 DIAGNOSIS — E1140 Type 2 diabetes mellitus with diabetic neuropathy, unspecified: Secondary | ICD-10-CM

## 2016-01-08 LAB — CBC
HEMATOCRIT: 34.1 % — AB (ref 38.5–50.0)
Hemoglobin: 11 g/dL — ABNORMAL LOW (ref 13.2–17.1)
MCH: 26.4 pg — AB (ref 27.0–33.0)
MCHC: 32.3 g/dL (ref 32.0–36.0)
MCV: 82 fL (ref 80.0–100.0)
MPV: 10.2 fL (ref 7.5–12.5)
Platelets: 160 10*3/uL (ref 140–400)
RBC: 4.16 MIL/uL — ABNORMAL LOW (ref 4.20–5.80)
RDW: 15 % (ref 11.0–15.0)
WBC: 3.2 10*3/uL — ABNORMAL LOW (ref 3.8–10.8)

## 2016-01-08 LAB — COMPREHENSIVE METABOLIC PANEL
ALT: 16 U/L (ref 9–46)
AST: 16 U/L (ref 10–35)
Albumin: 4.1 g/dL (ref 3.6–5.1)
Alkaline Phosphatase: 80 U/L (ref 40–115)
BILIRUBIN TOTAL: 0.3 mg/dL (ref 0.2–1.2)
BUN: 15 mg/dL (ref 7–25)
CALCIUM: 9.3 mg/dL (ref 8.6–10.3)
CO2: 23 mmol/L (ref 20–31)
CREATININE: 1.14 mg/dL (ref 0.70–1.25)
Chloride: 105 mmol/L (ref 98–110)
GLUCOSE: 229 mg/dL — AB (ref 65–99)
Potassium: 4.4 mmol/L (ref 3.5–5.3)
SODIUM: 140 mmol/L (ref 135–146)
Total Protein: 7.2 g/dL (ref 6.1–8.1)

## 2016-01-08 LAB — LIPID PANEL
CHOL/HDL RATIO: 3.6 ratio (ref <=5.0)
Cholesterol: 210 mg/dL — ABNORMAL HIGH (ref 125–200)
HDL: 58 mg/dL (ref >=40)
LDL CALC: 128 mg/dL (ref <130)
Triglycerides: 119 mg/dL (ref <150)
VLDL: 24 mg/dL (ref <30)

## 2016-01-08 LAB — HEPATITIS B SURFACE ANTIBODY,QUALITATIVE: Hep B S Ab: NEGATIVE

## 2016-01-08 NOTE — Assessment & Plan Note (Signed)
Will repeat his Cr today

## 2016-01-08 NOTE — BH Specialist Note (Signed)
Counselor met with Michael [name] today in the exam room per [name] request.  Patient was oriented times four with good affect and dress. patient was alert and talkative.  Patient shared that he was doing ok right now but would still like to make an appointment with counselor.  Patient shared that things in lie come up quickly and he would like to talk to someone to work out all the details. Patient indicated that he needed that and would make an appointment when he checked out today.   Rolena Infante, MA [facility]

## 2016-01-08 NOTE — Assessment & Plan Note (Signed)
He appears to be doing well.  Will have Michael [name] meet with him to be sure.

## 2016-01-08 NOTE — Progress Notes (Signed)
   Subjective:    Patient ID: Michael [name], male    DOB: 1950-12-30, 65 y.o.   MRN: [id number]  HPI 65 yo M with hx of uncontrolled DM2, HIV+ (previously on Complera), recent re-stent to LAD January 2015 after STEMI (06-2013).  He had L CEA on Feb 2017. Has f/u appt on 4-5. C/o numbness on his L neck.  Also hx of crack cocaine. None since January.  On hydrocodoen 5 for pain .   HIV 1 RNA QUANT (copies/mL)  Date Value  03/20/2015 <20  09/06/2014 83*  06/25/2014 24018*   CD4 T CELL ABS (/uL)  Date Value  03/20/2015 340*  09/06/2014 570  06/25/2014 380*    No problems with ART.Taking with food. Takes prn pepcid- not taken for several months.  Has had no further CP since CEA. No SOB.    Review of Systems  Constitutional: Negative for appetite change and unexpected weight change.  Respiratory: Negative for shortness of breath.   Cardiovascular: Negative for chest pain.  Gastrointestinal: Negative for diarrhea and constipation.  Genitourinary: Negative for difficulty urinating.       Objective:   Physical Exam  Constitutional: He appears well-developed and well-nourished.  HENT:  Mouth/Throat: No oropharyngeal exudate.  Eyes: EOM are normal. Pupils are equal, round, and reactive to light.  Neck: Neck supple.  Cardiovascular: Normal rate, regular rhythm and normal heart sounds.   Pulmonary/Chest: Effort normal and breath sounds normal.  Abdominal: Soft. Bowel sounds are normal. There is no tenderness. There is no rebound.  Musculoskeletal: He exhibits no edema.  Lymphadenopathy:    He has no cervical adenopathy.       Assessment & Plan:

## 2016-01-08 NOTE — Assessment & Plan Note (Signed)
He will f/u with PCP.

## 2016-01-08 NOTE — Assessment & Plan Note (Signed)
He appears to be doing well Needs flu shot- we are out! Can go to drug store.   Needs Jodi Needs Hep B S Ab.  Check his labs today Will see him back in 4 months.

## 2016-01-09 LAB — HIV-1 RNA QUANT-NO REFLEX-BLD: HIV 1 RNA Quant: <20 copies/mL (ref <20)

## 2016-01-09 LAB — RPR

## 2016-01-09 LAB — T-HELPER CELL (CD4) - (RCID CLINIC ONLY)
CD4 T CELL ABS: 440 /uL (ref 400–2700)
CD4 T CELL HELPER: 39 % (ref 33–55)

## 2016-01-10 ENCOUNTER — Ambulatory Visit: Payer: Self-pay | Admitting: Internal Medicine

## 2016-01-10 LAB — DRUG SCREEN, URINE
Amphetamine Screen, Ur: NEGATIVE
BARBITURATE QUANT UR: NEGATIVE
Benzodiazepines.: NEGATIVE
COCAINE METABOLITES: POSITIVE — AB
Creatinine,U: 121.36 mg/dL
MARIJUANA METABOLITE: NEGATIVE
Methadone: NEGATIVE
OPIATES: NEGATIVE
PHENCYCLIDINE (PCP): NEGATIVE
PROPOXYPHENE: NEGATIVE

## 2016-01-12 ENCOUNTER — Other Ambulatory Visit: Payer: Self-pay | Admitting: *Deleted

## 2016-01-12 DIAGNOSIS — E1149 Type 2 diabetes mellitus with other diabetic neurological complication: Secondary | ICD-10-CM

## 2016-01-12 NOTE — Patient Outreach (Addendum)
[facility]) Care Management  01/12/2016  Michael [name] [birth year] [id number]   Subjective: Telephone call to patient's home number, spoke with patient, and HIPAA verified.  Patient states he is doing fine and currently ambulates with a straight cane.   Discussed [facility] Care Management services and patient in agreement to receive services. Patient states his primary MD is [name].   RNCM advised patient,  will have patient's primary MD updated in his chart.  Patient in agreement with referral to [place] for diabetes education, disease monitoring, and community resources.    Patient states he has missed some medical appointments due to transportation issues.   Patient states he does not know his  Medicaid type coverage.  Patient states he is also in need of glasses and will let [facility] Social Worker know if he needs assistance.  Patient in agreement with referral to [place] [facility] Social Worker for transportation community resources and to verify Medicaid status to see if patient is eligible for [name] dual enrollment program.    Patient given RNCM's contact number, [facility] Main phone number, and 24 hour Nurse Advice line number for future reference.   Patient will continue to receive [place] [facility] services.   Objective:  Per chart review:  Patient's last specialist visit with [name] for HIV infection was on 01/08/16.   Patient also has a history of hypertension and diabetes.  Assessment: [facility] ED utilization list referral received on 01/09/16.  15 ED visits in past 12 months and 7 within last 6 months.    Plan:  RNCM will send request to update patient's primary MD in his chart to [name] at [facility].  RNCM will refer patient to [place] for diabetes education, disease monitoring, and community resources.    RNCM will refer patient to [place] Worker for transportation community resources and to verify Medicaid status to see if  patient is eligible for [facility] dual enrollment program.   Michael [name] Friendly, BSN, [place] [facility] Telephonic CM Phone: [phone] Fax: [phone]

## 2016-01-19 ENCOUNTER — Other Ambulatory Visit: Payer: Self-pay | Admitting: *Deleted

## 2016-01-19 IMAGING — CR DG CHEST 2V
2 series · 2 of 2 positions shown · non-contrast
Comparison: 06/25/2014

CLINICAL DATA: Shortness of breath and cough.

EXAM:
CHEST - 2 VIEW

[w chest pa]
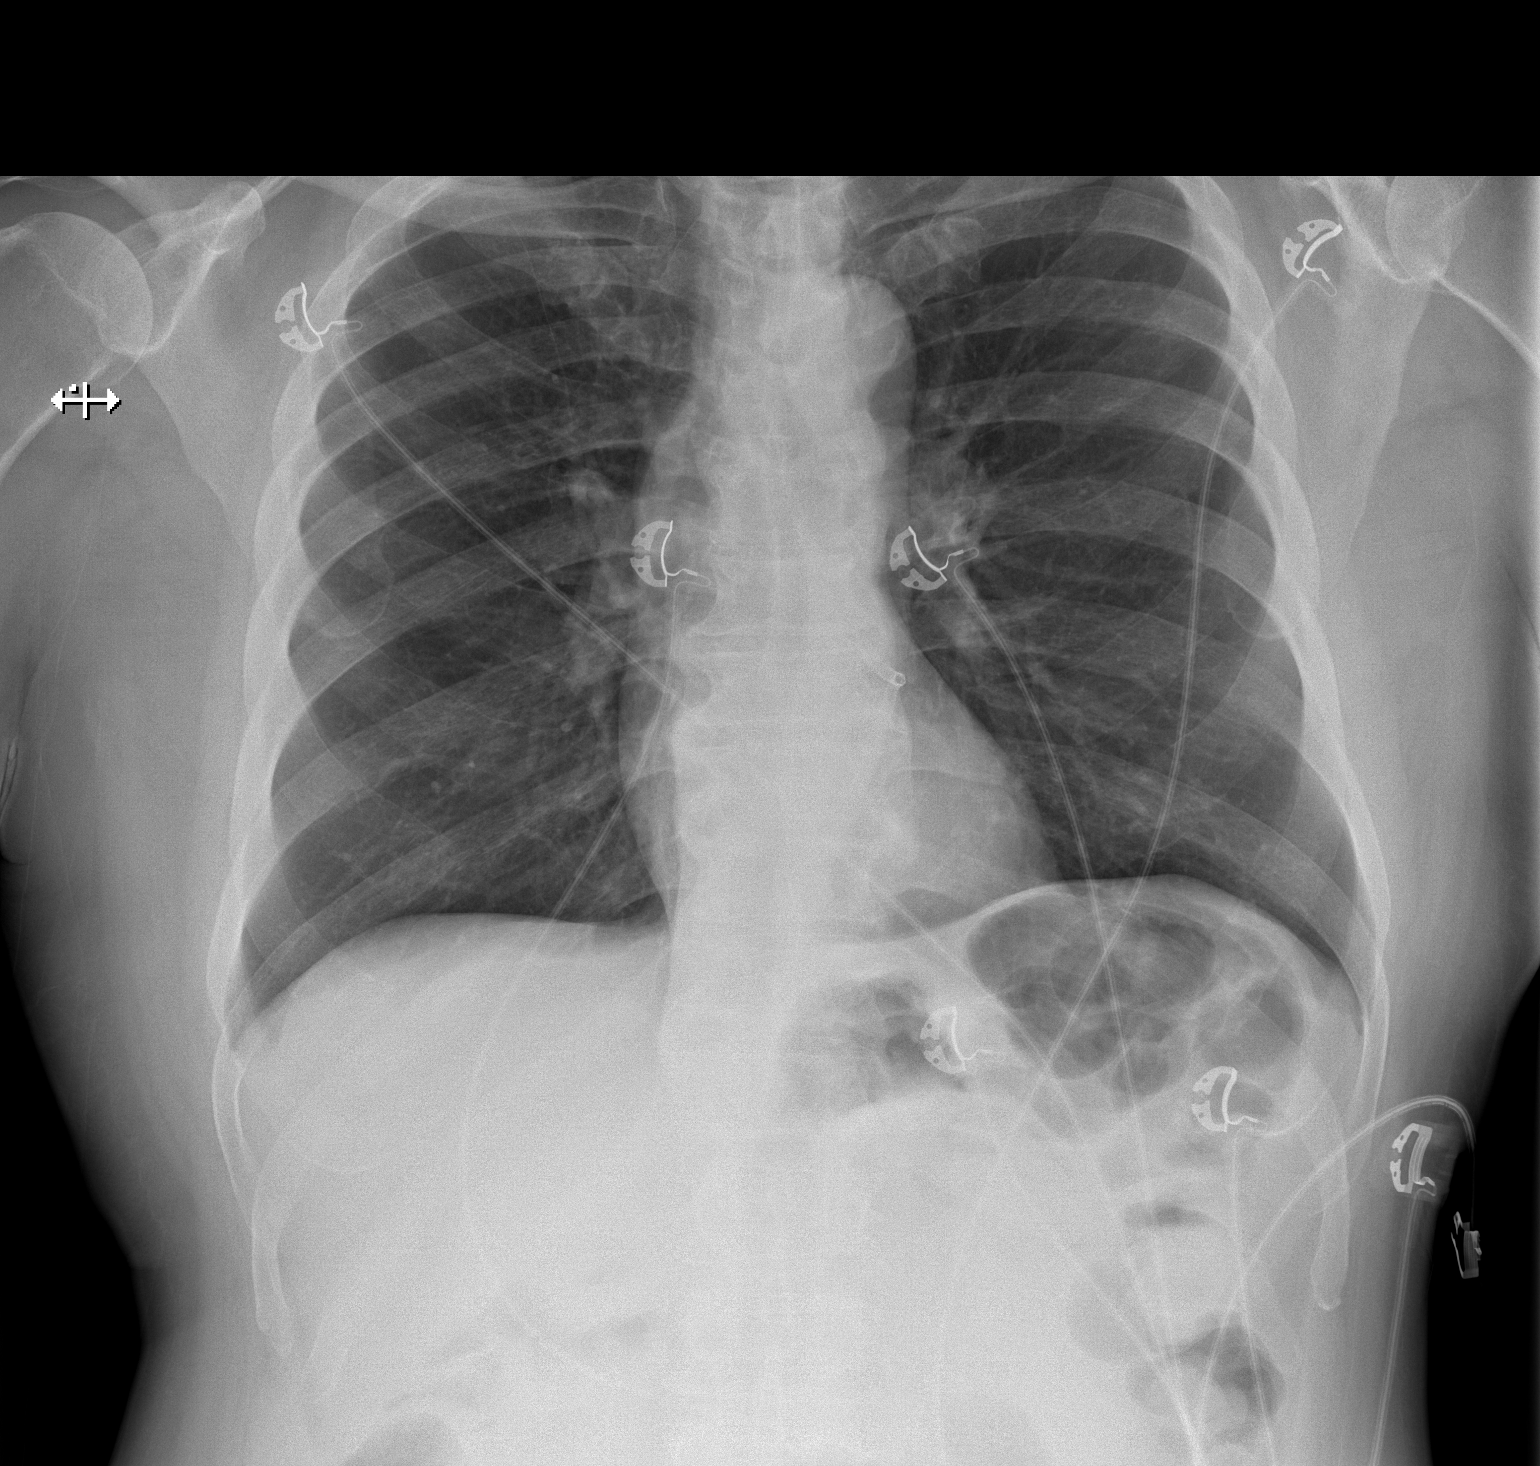

[w chest lat]
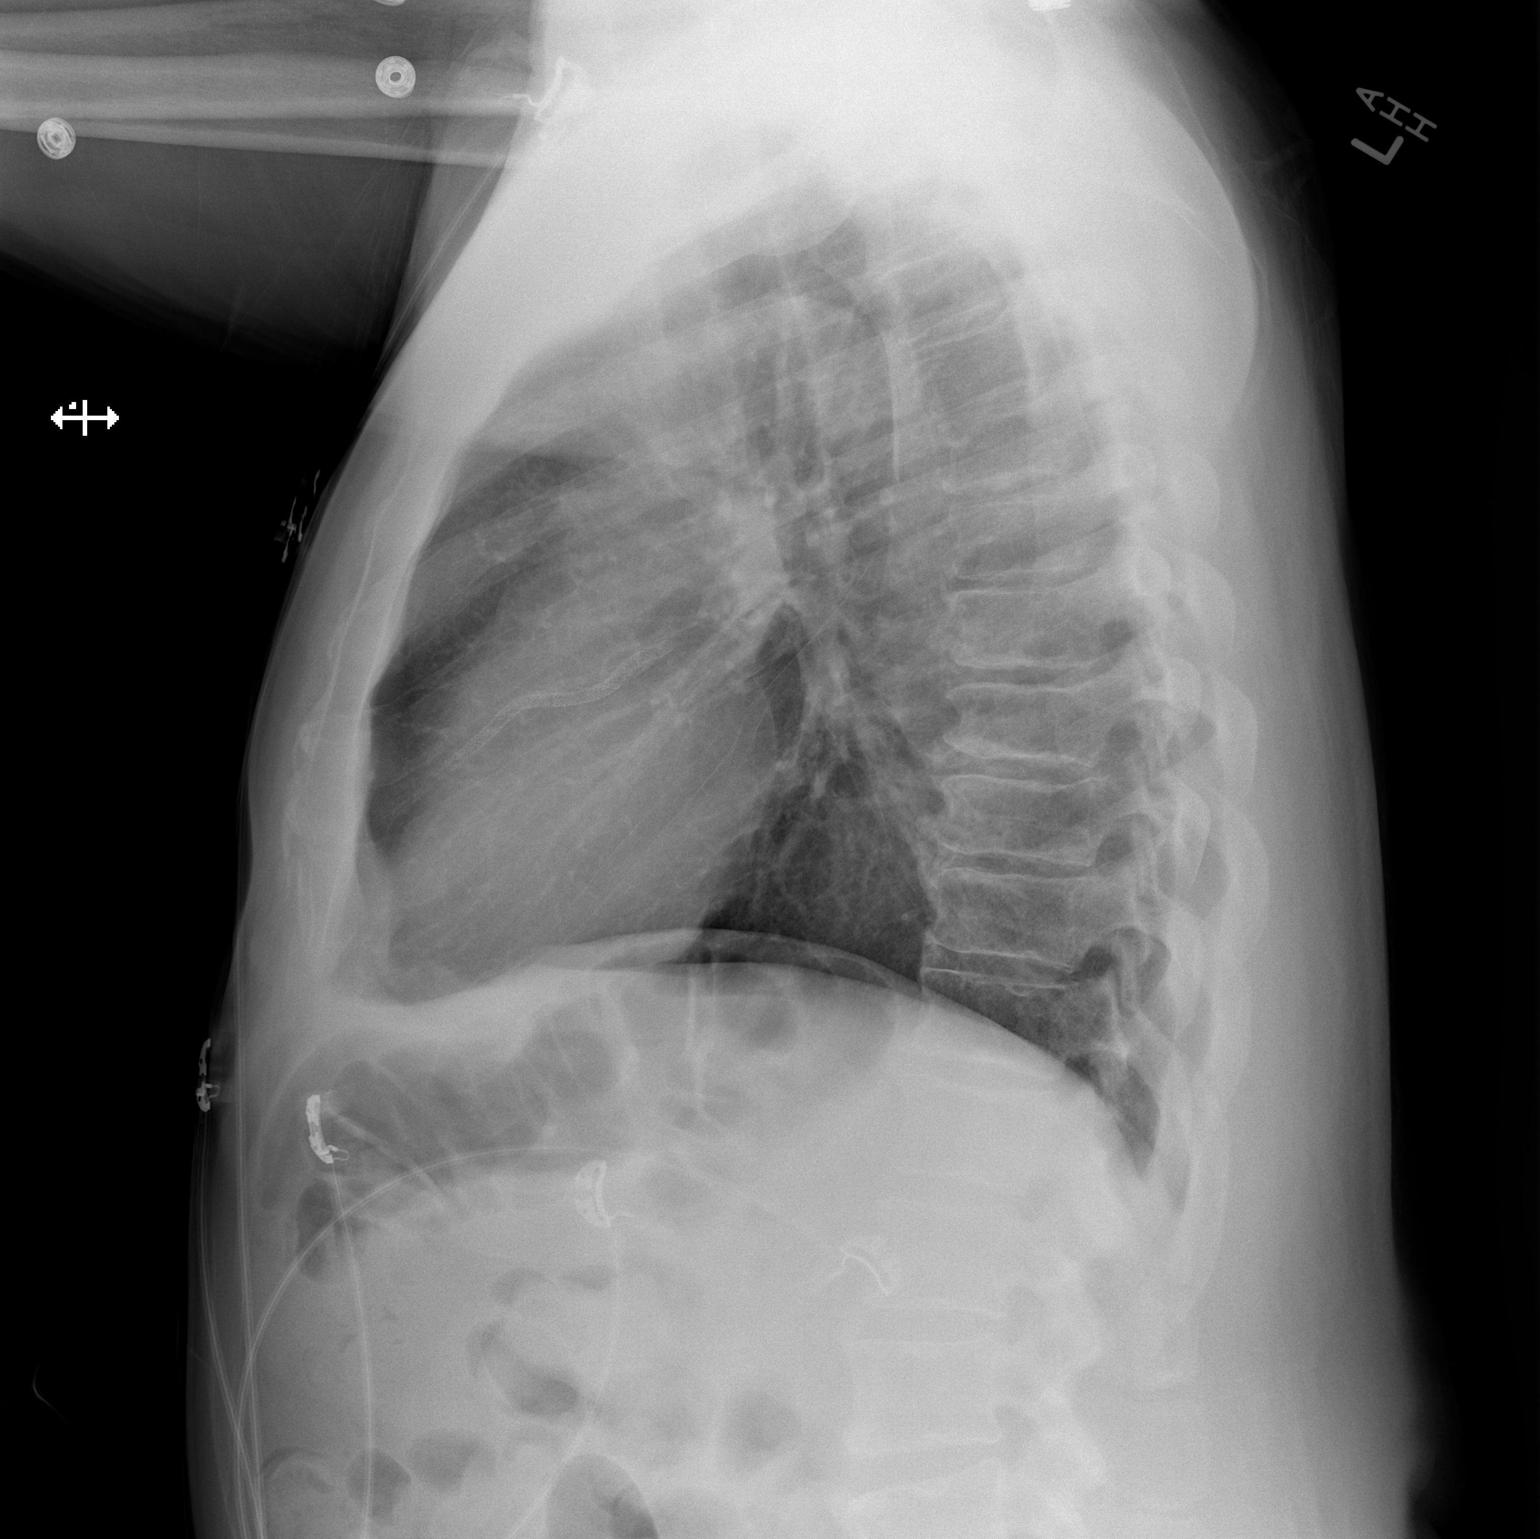

[2 of 2 positions shown; findings below may reference images not displayed]

FINDINGS: The heart size and mediastinal contours are within normal limits.
There is no evidence of pulmonary edema, consolidation,
pneumothorax, nodule or pleural fluid. Stable mild spondylosis of
the thoracic spine.
IMPRESSION: No active disease.

## 2016-01-19 NOTE — Patient Outreach (Signed)
[facility] Care Management  01/19/2016  Michael [name] 1950-10-12 [id number]   CSW received a new referral on patient from Jon Billings, Telephonic Nurse Case Manager with [facility], indicating that patient would benefit from social work services and resources to assist with transportation to and from physician appointments.  In addition, Mrs. Dia Sitter requested that patient would like for CSW to assist him with verifying his Adult Medicaid status through the [place]. Patient is currently active with Golden Hurter, Licensed Clinical Social Worker with the [facility], who has assisted patient with obtaining bus passes, as well as with completing his Adult Medicaid application.  CSW will converse with Mrs. Jeralyn Ruths to obtain an update on patient.  In addition, CSW has requested that Freda Jackson, Care Management Assistant, also with [facility], mail patient transportation resources for his review, as well as an application for [employer] Paramedic). CSW made an initial attempt to try and contact patient today to perform phone assessment, as well as assess and assist with social needs and services, without success.  A HIPAA complaint message was left for patient on voicemail.  CSW is currently awaiting a return call. [name], BSW, MSW, LCSW  Licensed Education officer, [facility]  Mailing [place] [address] [place], [place] [address] Physical Address-[address], [place], [place] [address] Toll Free Main # [phone] Fax # [phone] Cell # [phone]  Fax # [phone]  Di [email or web address]  Patient's preferred language:  [place]   English  ATTENTION:  If you speak [place], language assistance services, free of charge, are available to you.     Nondiscrimination and Accessibility Statement: Discrimination is Against the [employer], a subsidiary of [employer], complies with [employer] civil rights laws and does not discriminate on the basis of race, color, national origin, age, disability, or sex.  [place] does not exclude people or treat them differently because of race, color, national origin, age, disability, or sex.  [place] Providers will:  . Provide free aids and services to people with disabilities to communicate effectively with [place], such as:     ? Qualified sign language interpreters  ? Written information in other formats (large print, audio, accessible electronic formats, other formats)   . Provide free language services to people whose primary language is not [place], such as:    ? Qualified interpreters    ? Information written in other languages   If you need these services, contact your [facility] Fore[facility] psychologist.  If you believe that a [employer] has failed to provide these services or discriminated in another way on the basis of race, color, national origin, age, disability, or sex, you can file a Tourist information centre manager with: [place], [phone] or [email or web address].info/.  You can file a grievance in person or by mail, fax, or email. If you need help filing a grievance, you may contact Valrie Hart, Interim Compliance Officer, [facility] Department of Compliance and Integrity, [address], [place], [place]. [place], [phone], [email or web address].    You can also file a civil rights complaint with the U.S. Department of Health and Financial controller, Office for [employer], electronically through the Office for Civil Rights Complaint Portal, available at OnSiteLending.nl.jsf, or by mail or phone at:  [place].  Department of Health  and [employer] [place], [facility] [address], [place]  [phone], [phone] (TDD) Complaint forms are available at CutFunds.[email or web address].

## 2016-01-23 ENCOUNTER — Encounter: Payer: Self-pay | Admitting: Internal Medicine

## 2016-01-23 ENCOUNTER — Ambulatory Visit: Payer: Self-pay | Admitting: Internal Medicine

## 2016-01-25 ENCOUNTER — Other Ambulatory Visit: Payer: Self-pay

## 2016-01-25 NOTE — Patient Outreach (Signed)
Michael [name]) Care Management  [place]  01/25/2016   Michael [name] 12/22/1950 [id number]  Subjective: Telephone call to patient for initial assessment. Patient reports he is doing ok. Patient reports blood sugar today was 134 and states he takes his medication regularly.  Patient cannot remember his last A1c.  Discussed with patient the importance of A1c.  Patient also reports not having an eye exam.  Discussed the importance of diabetic eye exam.  He verbalized understanding and will follow up on this.  Also discussed with patient continuing diabetic diet.  He verbalized understanding.  Patient reports some issues with transportation.  Advised health coach will get resources for transportation sent to him.  He is agreeable.  No other concerns.   Objective:   Encounter Medications:  Outpatient Encounter Prescriptions as of 01/25/2016  Medication Sig  . allopurinol (ZYLOPRIM) 100 MG tablet Take 2 tablets (200 mg total) by mouth daily. For gouty arthritis  . amantadine (SYMMETREL) 100 MG capsule Take 1 capsule by mouth daily.  Marland Kitchen aspirin EC 81 MG tablet Take 1 tablet (81 mg total) by mouth daily. For heart health  . clopidogrel (PLAVIX) 75 MG tablet Take 1 tablet (75 mg total) by mouth daily. For prevention of stroke  . COMPLERA 200-25-300 MG tablet TAKE 1 TABLET BY MOUTH DAILY.  . cyclobenzaprine (FLEXERIL) 10 MG tablet Take 1 tablet (10 mg total) by mouth 2 (two) times daily as needed for muscle spasms.  Marland Kitchen diltiazem (CARDIZEM CD) 120 MG 24 hr capsule Take 1 capsule by mouth daily.  . famotidine (PEPCID) 20 MG tablet Take 1 tablet (20 mg total) by mouth daily. 8pm  . fluticasone (FLONASE) 50 MCG/ACT nasal spray Place 1 spray into both nostrils daily.   Marland Kitchen gabapentin (NEURONTIN) 300 MG capsule Take 1 capsule by mouth daily.  . Insulin Glargine (LANTUS SOLOSTAR) 100 UNIT/ML Solostar Pen Inject 15 Units into the skin at bedtime.  . isosorbide mononitrate (IMDUR) 30 MG  24 hr tablet TAKE 1 TABLET (30 MG TOTAL) BY MOUTH DAILY.  Marland Kitchen losartan (COZAAR) 50 MG tablet Take 1 tablet by mouth daily.  . metFORMIN (GLUCOPHAGE) 1000 MG tablet Take 1 tablet (1,000 mg total) by mouth 2 (two) times daily with a meal. For diabetes management  . metoprolol succinate (TOPROL-XL) 25 MG 24 hr tablet Take 25 mg by mouth daily.   . naproxen (NAPROSYN) 500 MG tablet Take 1 tablet (500 mg total) by mouth 2 (two) times daily.  . polyethylene glycol (MIRALAX) packet Take 17 g by mouth daily.  . pravastatin (PRAVACHOL) 40 MG tablet Take 1 tablet (40 mg total) by mouth daily. For high cholesterol  . PROAIR HFA 108 (90 BASE) MCG/ACT inhaler INHALE TWO   PUFFS INTO THE LUNGS EVERY 6 (SIX) HOURS AS NEEDED FOR WHEEZING OR SHORTNESS OF BREATH.  . risperiDONE (RISPERDAL) 1 MG tablet Take 1 mg by mouth at bedtime.   . topiramate (TOPAMAX) 50 MG tablet Take 50 mg by mouth daily.   . traZODone (DESYREL) 50 MG tablet Take 50 mg by mouth at bedtime.   Marland Kitchen glipiZIDE (GLUCOTROL XL) 2.5 MG 24 hr tablet Take 2.5 mg by mouth 2 (two) times daily. Reported on 01/25/2016   No facility-administered encounter medications on file as of 01/25/2016.    Functional Status:  In your present state of health, do you have any difficulty performing the following activities: 01/25/2016 09/26/2015  Hearing? N N  Vision? N N  Difficulty concentrating or making  decisions? N N  Walking or climbing stairs? N Y  Dressing or bathing? N Y  Doing errands, shopping? N -  Preparing Food and eating ? N -  Using the Toilet? N -  In the past six months, have you accidently leaked urine? N -  Do you have problems with loss of bowel control? N -  Managing your Medications? N -  Managing your Finances? N -  Housekeeping or managing your Housekeeping? N -  Some encounter information is confidential and restricted. Go to Review Flowsheets activity to see all data.    Fall/Depression Screening: PHQ 2/9 Scores 01/25/2016 01/12/2016  01/08/2016 07/18/2015 07/10/2015 04/26/2015 03/20/2015  PHQ - 2 Score 1 1 0 6 0 6 0  PHQ- 9 Score - - - 25 - 24 0    Assessment: Patient will benefit from health coach outreach for disease management and support.  Plan:  [place] [name] CM Care Plan Problem One        Most Recent Value   Care Plan Problem One  Diabetes Knowledge Deficit   Role Documenting the Problem One  Health Coach   Care Plan for Problem One  Active   THN Long Term Goal (31-90 days)  Patient will maintain A1c below 7 within 90 days.   THN Long Term Goal Start Date  01/25/16   Interventions for Problem One [place] discussed with patient A1c and goal of A1c.     THN CM Short Term Goal #1 (0-30 days)  Patient will report making eye exam appointment within 30 days.   THN CM Short Term Goal #1 Start Date  01/25/16   Interventions for Short Term Goal #1  RN Health Coach discussed the importance of annual eye exam.     THN CM Short Term Goal #2 (0-30 days)  Patient will be able to report maintaining low carbohydrate diet within 30 days.    THN CM Short Term Goal #2 Start Date  01/25/16   Interventions for Short Term Goal #2  [place] discussed avoidance of high carbohydrate foods such as potatoes, rice, and pastas.      RN Health Coach will provide ongoing education for patient on diabetes through phone calls and sending printed information to patient for further discussion.  RN Health Coach will send welcome packet with consent to patient as well as printed information on diabetes. RN Health Coach will send initial barriers letter, assessment, and care plan to primary care physician.  RN Health Coach will contact patient within one month and patient agrees to next contact.   Michael Baseman, RN, MSN [place] [phone]

## 2016-01-26 ENCOUNTER — Other Ambulatory Visit: Payer: Self-pay | Admitting: *Deleted

## 2016-01-28 ENCOUNTER — Encounter: Payer: Self-pay | Admitting: *Deleted

## 2016-01-28 NOTE — Patient Outreach (Addendum)
[facility]) Care Management  01/28/2016  JERETT ODONOHUE [birth year] [id number]   CSW was able to make initial contact with patient today to perform phone assessment, as well as assess and assist with social work needs and services.  Patient confirms that his Adult Medicaid application has been submitted to the [place] for processing.  CSW introduced self, explained role and types of services provided through [place] [facility] ([place] [facility]).  CSW further explained to patient that CSW works with patient's RNCM, also with [place] [facility], Dionne Leath. CSW then explained the reason for the call, indicating that Mrs. Dia Sitter thought that patient would benefit from social work services and resources to assist with arranging transportation to and from patient's physician appointments.  CSW obtained two HIPAA compliant identifiers from patient, which included patient's name and date of birth. Patient was not aware that he can apply for [facility] Transportation and Medicaid Taxi through Adult Medicaid, if he is approved.  CSW agreed to contact the [place] to make this request, as an addendum to patient's Adult Medicaid application.  Patient's application for Adult Medicaid was only recently submitted, so patient is aware that it may take an additional month to month and a half before he receives an approval/denial letter.  Patient has been given contact information for the Adult Medicaid Division and has agreed to follow-up periodically to check the status of his application.  CSW agreed to mail patient an application for [employer]), as another source of transportation to and from his physician appointments.  Patient also admits to using public transportation. CSW will perform a case closure on patient, as all goals of treatment have been met from  social work standpoint and no additional social work needs have been identified at this time. CSW will notify patient's RNCM with [place] [facility], Jon Billings of CSW's plans to close patient's case. CSW will fax a correspondence letter to patient's Primary Care Physician, [name] to ensure that Dr. Daryll Drown is aware of CSW's case closure plans.  CSW will submit a case closure request to Lurline Del, Care Management Assistant with [place] [facility], in the form of an [employer].  CSW will ensure that Mrs. Laurance Flatten is aware of Aleatha Borer, RNCM with [place] [facility], continued involvement with patient's care.  [name], BSW, MSW, LCSW  Licensed Education officer, [facility]  Mailing [place] [address] [place], [place] [address] Physical Address-[address], [place], [place] [address] Toll Free Main # [phone] Fax # [phone] Cell # [phone]  Fax # [phone]  [email or web address]

## 2016-01-30 ENCOUNTER — Other Ambulatory Visit: Payer: Self-pay | Admitting: Internal Medicine

## 2016-01-30 NOTE — Telephone Encounter (Signed)
Last OV 08/2015, couple of no shows, no future visits scheduled.

## 2016-01-30 NOTE — Telephone Encounter (Signed)
He needs an appointment.  Can you put him on the callback list for July when my schedule comes out.   Thanks  EBM

## 2016-02-01 ENCOUNTER — Encounter: Payer: Self-pay | Admitting: Internal Medicine

## 2016-02-07 ENCOUNTER — Telehealth: Payer: Self-pay | Admitting: Internal Medicine

## 2016-02-07 NOTE — Telephone Encounter (Signed)
APT, REMINDER CALL, LMTCB

## 2016-02-08 ENCOUNTER — Encounter: Payer: Self-pay | Admitting: Internal Medicine

## 2016-02-19 ENCOUNTER — Ambulatory Visit: Payer: Self-pay

## 2016-02-19 ENCOUNTER — Other Ambulatory Visit: Payer: Self-pay

## 2016-02-19 NOTE — Patient Outreach (Signed)
[facility]) Care Management  02/19/2016  DANIELJOSEPH ULVEN 1951-09-13 [id number]   Telephone call to patient for monthly outreach.  States that number is changed or disconnected.  Called son Syan Krass II.  He has not spoken with his father but was able to verify same phone number.    Plan: RN Health Coach will attempt number again within 1-2 weeks.    [name], RN, MSN [place] [phone]

## 2016-02-22 ENCOUNTER — Other Ambulatory Visit: Payer: Self-pay | Admitting: Cardiovascular Disease

## 2016-02-22 ENCOUNTER — Other Ambulatory Visit: Payer: Self-pay | Admitting: Internal Medicine

## 2016-02-22 NOTE — Telephone Encounter (Signed)
Rx request sent to pharmacy.  

## 2016-02-23 NOTE — Telephone Encounter (Signed)
Pt needs appt

## 2016-02-27 ENCOUNTER — Ambulatory Visit: Payer: Self-pay

## 2016-02-27 ENCOUNTER — Other Ambulatory Visit: Payer: Self-pay

## 2016-02-27 NOTE — Patient Outreach (Signed)
[facility]) Care Management  02/27/2016  Michael [name] January 05, 1951 [id number]   Telephone call to patient for monthly outreach. States that number is changed or disconnected.    Plan: RN Health Coach will attempt number again within 1-2 weeks.   [name], RN, MSN [place] [phone]

## 2016-02-28 ENCOUNTER — Other Ambulatory Visit: Payer: Self-pay | Admitting: Internal Medicine

## 2016-03-05 ENCOUNTER — Ambulatory Visit: Payer: Self-pay

## 2016-03-05 ENCOUNTER — Other Ambulatory Visit: Payer: Self-pay

## 2016-03-05 NOTE — Patient Outreach (Signed)
[facility] [facility]) Care Management  03/05/2016  Michael [name] October 14, 1950 [id number]   Third telephone call to patient for monthly outreach.   Number states disconnected.    Plan: RN will send outreach letter to attempt contact.    [name], RN, MSN [place] [phone]

## 2016-03-09 DIAGNOSIS — Z7902 Long term (current) use of antithrombotics/antiplatelets: Secondary | ICD-10-CM | POA: Diagnosis not present

## 2016-03-09 DIAGNOSIS — Z794 Long term (current) use of insulin: Secondary | ICD-10-CM | POA: Diagnosis not present

## 2016-03-09 DIAGNOSIS — I10 Essential (primary) hypertension: Secondary | ICD-10-CM | POA: Diagnosis not present

## 2016-03-09 DIAGNOSIS — I6789 Other cerebrovascular disease: Secondary | ICD-10-CM | POA: Diagnosis not present

## 2016-03-09 DIAGNOSIS — R531 Weakness: Secondary | ICD-10-CM | POA: Diagnosis not present

## 2016-03-09 DIAGNOSIS — R45851 Suicidal ideations: Secondary | ICD-10-CM | POA: Diagnosis not present

## 2016-03-09 DIAGNOSIS — Z7951 Long term (current) use of inhaled steroids: Secondary | ICD-10-CM | POA: Diagnosis not present

## 2016-03-09 DIAGNOSIS — I2510 Atherosclerotic heart disease of native coronary artery without angina pectoris: Secondary | ICD-10-CM | POA: Diagnosis not present

## 2016-03-09 DIAGNOSIS — R4182 Altered mental status, unspecified: Secondary | ICD-10-CM | POA: Diagnosis not present

## 2016-03-09 DIAGNOSIS — Z79899 Other long term (current) drug therapy: Secondary | ICD-10-CM | POA: Diagnosis not present

## 2016-03-09 DIAGNOSIS — F1410 Cocaine abuse, uncomplicated: Secondary | ICD-10-CM | POA: Diagnosis not present

## 2016-03-09 DIAGNOSIS — R200 Anesthesia of skin: Secondary | ICD-10-CM | POA: Diagnosis not present

## 2016-03-09 DIAGNOSIS — Z7984 Long term (current) use of oral hypoglycemic drugs: Secondary | ICD-10-CM | POA: Diagnosis not present

## 2016-03-09 DIAGNOSIS — Z7982 Long term (current) use of aspirin: Secondary | ICD-10-CM | POA: Diagnosis not present

## 2016-03-09 DIAGNOSIS — E119 Type 2 diabetes mellitus without complications: Secondary | ICD-10-CM | POA: Diagnosis not present

## 2016-03-09 DIAGNOSIS — Z955 Presence of coronary angioplasty implant and graft: Secondary | ICD-10-CM | POA: Diagnosis not present

## 2016-03-09 DIAGNOSIS — E785 Hyperlipidemia, unspecified: Secondary | ICD-10-CM | POA: Diagnosis not present

## 2016-03-09 DIAGNOSIS — Z21 Asymptomatic human immunodeficiency virus [HIV] infection status: Secondary | ICD-10-CM | POA: Diagnosis not present

## 2016-03-10 DIAGNOSIS — R45851 Suicidal ideations: Secondary | ICD-10-CM | POA: Diagnosis not present

## 2016-03-10 DIAGNOSIS — E1165 Type 2 diabetes mellitus with hyperglycemia: Secondary | ICD-10-CM | POA: Diagnosis not present

## 2016-03-10 DIAGNOSIS — T50904A Poisoning by unspecified drugs, medicaments and biological substances, undetermined, initial encounter: Secondary | ICD-10-CM | POA: Diagnosis not present

## 2016-03-10 DIAGNOSIS — R739 Hyperglycemia, unspecified: Secondary | ICD-10-CM | POA: Diagnosis not present

## 2016-03-10 DIAGNOSIS — Z7982 Long term (current) use of aspirin: Secondary | ICD-10-CM | POA: Diagnosis not present

## 2016-03-10 DIAGNOSIS — I2510 Atherosclerotic heart disease of native coronary artery without angina pectoris: Secondary | ICD-10-CM | POA: Diagnosis not present

## 2016-03-10 DIAGNOSIS — I10 Essential (primary) hypertension: Secondary | ICD-10-CM | POA: Diagnosis not present

## 2016-03-10 DIAGNOSIS — Z955 Presence of coronary angioplasty implant and graft: Secondary | ICD-10-CM | POA: Diagnosis not present

## 2016-03-10 DIAGNOSIS — Z7984 Long term (current) use of oral hypoglycemic drugs: Secondary | ICD-10-CM | POA: Diagnosis not present

## 2016-03-10 DIAGNOSIS — F1410 Cocaine abuse, uncomplicated: Secondary | ICD-10-CM | POA: Diagnosis not present

## 2016-03-10 DIAGNOSIS — E785 Hyperlipidemia, unspecified: Secondary | ICD-10-CM | POA: Diagnosis not present

## 2016-03-11 ENCOUNTER — Encounter (HOSPITAL_COMMUNITY): Payer: Self-pay | Admitting: *Deleted

## 2016-03-11 DIAGNOSIS — M79605 Pain in left leg: Secondary | ICD-10-CM | POA: Insufficient documentation

## 2016-03-11 DIAGNOSIS — I4891 Unspecified atrial fibrillation: Secondary | ICD-10-CM | POA: Diagnosis not present

## 2016-03-11 DIAGNOSIS — E119 Type 2 diabetes mellitus without complications: Secondary | ICD-10-CM | POA: Insufficient documentation

## 2016-03-11 DIAGNOSIS — Z7984 Long term (current) use of oral hypoglycemic drugs: Secondary | ICD-10-CM | POA: Insufficient documentation

## 2016-03-11 DIAGNOSIS — E785 Hyperlipidemia, unspecified: Secondary | ICD-10-CM | POA: Diagnosis not present

## 2016-03-11 DIAGNOSIS — Z7982 Long term (current) use of aspirin: Secondary | ICD-10-CM | POA: Insufficient documentation

## 2016-03-11 DIAGNOSIS — M109 Gout, unspecified: Secondary | ICD-10-CM | POA: Insufficient documentation

## 2016-03-11 DIAGNOSIS — Z9861 Coronary angioplasty status: Secondary | ICD-10-CM | POA: Diagnosis not present

## 2016-03-11 DIAGNOSIS — J45909 Unspecified asthma, uncomplicated: Secondary | ICD-10-CM | POA: Diagnosis not present

## 2016-03-11 DIAGNOSIS — G8929 Other chronic pain: Secondary | ICD-10-CM | POA: Insufficient documentation

## 2016-03-11 DIAGNOSIS — M79604 Pain in right leg: Secondary | ICD-10-CM | POA: Insufficient documentation

## 2016-03-11 DIAGNOSIS — B20 Human immunodeficiency virus [HIV] disease: Secondary | ICD-10-CM | POA: Insufficient documentation

## 2016-03-11 DIAGNOSIS — Z79899 Other long term (current) drug therapy: Secondary | ICD-10-CM | POA: Insufficient documentation

## 2016-03-11 DIAGNOSIS — Z7902 Long term (current) use of antithrombotics/antiplatelets: Secondary | ICD-10-CM | POA: Insufficient documentation

## 2016-03-11 DIAGNOSIS — M545 Low back pain: Secondary | ICD-10-CM | POA: Diagnosis not present

## 2016-03-11 DIAGNOSIS — I2510 Atherosclerotic heart disease of native coronary artery without angina pectoris: Secondary | ICD-10-CM | POA: Diagnosis not present

## 2016-03-11 DIAGNOSIS — F329 Major depressive disorder, single episode, unspecified: Secondary | ICD-10-CM | POA: Insufficient documentation

## 2016-03-11 DIAGNOSIS — Z791 Long term (current) use of non-steroidal anti-inflammatories (NSAID): Secondary | ICD-10-CM | POA: Diagnosis not present

## 2016-03-11 DIAGNOSIS — Z872 Personal history of diseases of the skin and subcutaneous tissue: Secondary | ICD-10-CM | POA: Insufficient documentation

## 2016-03-11 DIAGNOSIS — I10 Essential (primary) hypertension: Secondary | ICD-10-CM | POA: Diagnosis not present

## 2016-03-11 DIAGNOSIS — Z794 Long term (current) use of insulin: Secondary | ICD-10-CM | POA: Diagnosis not present

## 2016-03-11 NOTE — ED Notes (Signed)
Patient states about 4pm his legs started hurting.  Stated he fell 2 times after they started hurting.

## 2016-03-12 ENCOUNTER — Emergency Department (HOSPITAL_COMMUNITY): Payer: Medicare Other

## 2016-03-12 ENCOUNTER — Emergency Department (HOSPITAL_COMMUNITY)
Admission: EM | Admit: 2016-03-12 | Discharge: 2016-03-12 | Disposition: A | Discharge status: Home / Self Care | Payer: Medicare Other | Attending: Emergency Medicine | Admitting: Emergency Medicine

## 2016-03-12 DIAGNOSIS — M545 Low back pain, unspecified: Secondary | ICD-10-CM

## 2016-03-12 LAB — URINALYSIS, ROUTINE W REFLEX MICROSCOPIC
Bilirubin Urine: NEGATIVE
Glucose, UA: >1000 mg/dL — AB
Hgb urine dipstick: NEGATIVE
Ketones, ur: 15 mg/dL — AB
LEUKOCYTES UA: NEGATIVE
NITRITE: NEGATIVE
PH: 5 (ref 5.0–8.0)
Protein, ur: NEGATIVE mg/dL
SPECIFIC GRAVITY, URINE: 1.027 (ref 1.005–1.030)

## 2016-03-12 LAB — BASIC METABOLIC PANEL
ANION GAP: 9 (ref 5–15)
BUN: 14 mg/dL (ref 6–20)
CALCIUM: 9.5 mg/dL (ref 8.9–10.3)
CHLORIDE: 102 mmol/L (ref 101–111)
CO2: 25 mmol/L (ref 22–32)
Creatinine, Ser: 1.33 mg/dL — ABNORMAL HIGH (ref 0.61–1.24)
GFR, EST NON AFRICAN AMERICAN: 55 mL/min — AB (ref >60)
GLUCOSE: 291 mg/dL — AB (ref 65–99)
POTASSIUM: 4.1 mmol/L (ref 3.5–5.1)
SODIUM: 136 mmol/L (ref 135–145)

## 2016-03-12 LAB — CBC WITH DIFFERENTIAL/PLATELET
BASOS PCT: 0 %
Basophils Absolute: 0 10*3/uL (ref 0.0–0.1)
EOS ABS: 0.1 10*3/uL (ref 0.0–0.7)
Eosinophils Relative: 3 %
HEMATOCRIT: 35 % — AB (ref 39.0–52.0)
HEMOGLOBIN: 10.9 g/dL — AB (ref 13.0–17.0)
Lymphocytes Relative: 31 %
Lymphs Abs: 1.5 10*3/uL (ref 0.7–4.0)
MCH: 25.1 pg — ABNORMAL LOW (ref 26.0–34.0)
MCHC: 31.1 g/dL (ref 30.0–36.0)
MCV: 80.5 fL (ref 78.0–100.0)
MONOS PCT: 8 %
Monocytes Absolute: 0.4 10*3/uL (ref 0.1–1.0)
NEUTROS ABS: 2.9 10*3/uL (ref 1.7–7.7)
NEUTROS PCT: 58 %
Platelets: 147 10*3/uL — ABNORMAL LOW (ref 150–400)
RBC: 4.35 MIL/uL (ref 4.22–5.81)
RDW: 14.2 % (ref 11.5–15.5)
WBC: 5 10*3/uL (ref 4.0–10.5)

## 2016-03-12 LAB — URINE MICROSCOPIC-ADD ON

## 2016-03-12 MED ORDER — HYDROMORPHONE HCL 1 MG/ML IJ SOLN
1.0000 mg | Freq: Once | INTRAMUSCULAR | Status: AC
  Administered 2016-03-12: 1 mg via INTRAVENOUS
  Filled 2016-03-12: qty 1

## 2016-03-12 MED ORDER — ONDANSETRON HCL 4 MG/2ML IJ SOLN: 4.0000 mg | Freq: Once | INTRAMUSCULAR | Status: DC

## 2016-03-12 MED ORDER — HYDROMORPHONE HCL 1 MG/ML IJ SOLN: 1.0000 mg | Freq: Once | INTRAMUSCULAR | Status: DC

## 2016-03-12 MED ORDER — OXYCODONE-ACETAMINOPHEN 5-325 MG PO TABS: 1.0000 | ORAL_TABLET | ORAL | Status: DC | PRN

## 2016-03-12 NOTE — Care Management Note (Addendum)
Case Management Note  Patient Details  Name: Michael [name] MRN: [id number] Date of Birth: Apr 10, 1951  Subjective/Objective:                  65 y.o. male with PMHx of DM, HTN, HA, HLD, HIV, Gout, crack cocaine use, Chondromalacia of medial femoral condyle, asthma, depression, collagen vascular disease, cellulitis, CAD and A-Fib who presents to the Emergency Department complaining of sudden onset, constant, gradual worsening, moderate bilateral leg pain onset today. /Home alone. Action/Plan: Follow for disposition needs.   Expected Discharge Date:      03/12/2016            Expected Discharge Plan:  [place]  In-House Referral:     Discharge planning Services  CM Consult  Post Acute Care Choice:  Home Health Choice offered to:  Patient  DME Arranged:  N/A DME Agency:     HH Arranged:  RN, PT, OT, Nurse's Aide, Social Work [employer] Agency:  [place]  Status of Service:  Completed, signed off  Medicare Important Message Given:    Date Medicare IM Given:    Medicare IM give by:    Date Additional Medicare IM Given:    Additional Medicare Important Message give by:     If discussed at [place] of Stay Meetings, dates discussed:    Additional Comments: Michael [name] J. Clydene Laming, RN, BSN, [employer] [phone] Spoke with pt at bedside regarding discharge planning for [facility]. Offered pt list of home health agencies to choose from.  Pt chose [facility] to render services. Michael [name] of [facility] notified.  No DME needs identified at this time.  Michael Mandril, RN 03/12/2016, 9:27 AM

## 2016-03-12 NOTE — ED Notes (Signed)
Tried walking pt, he was very unsteady. Only made it to doorway. Dr. Betsey Holiday notified.

## 2016-03-12 NOTE — ED Notes (Signed)
Patient has returned from being out of the department; patient placed back on continuous pulse oximetry and blood pressure cuff 

## 2016-03-12 NOTE — ED Notes (Signed)
Pt taken to MRI  

## 2016-03-12 NOTE — ED Notes (Signed)
Patient d/c'd from continuous pulse oximetry and blood pressure cuff; patient getting dressed to be discharged home at this time

## 2016-03-12 NOTE — ED Provider Notes (Signed)
CSN: [id number]     Arrival date & time 03/11/16  2247 History  By signing my name below, I, Arianna Nassar and Roxine Caddy, attest that this documentation has been prepared under the direction and in the presence of Orpah Greek, MD. Electronically Signed: Julien Nordmann, ED Scribe. 03/12/2016. 3:52 AM.    Chief Complaint  Patient presents with  . Leg Pain     The history is provided by the patient. No language interpreter was used.   HPI Comments: Michael [name] is a 65 y.o. male with PMHx of DM, HTN, HA, HLD, HIV, Gout, crack cocaine use, Chondromalacia of medial femoral condyle, asthma, depression, collagen vascular disease, cellulitis, CAD and A-Fib who presents to the Emergency Department complaining of sudden onset, constant, gradual worsening, moderate bilateral leg pain onset today. He reports associated bilateral leg weakness and a flare up in his chronic lower back pain. Pt notes the pain starts in his bilateral hips and radiates down. Pt expresses increased pain with movement and says that his symptoms are not similar to his gout flare ups. He denies any numbness.   Past Medical History  Diagnosis Date  . Diabetes mellitus     Diagnosed in 2002, started insulin in 2012  . Hypertension   . Headache(784.0)     CT head 08/2011: Periventricular and subcortical white matter hypodensities are most in keeping with chronic microangiopathic change  . Gout   . Hyperlipidemia   . HIV infection [facility]) Nov 2012    Followed by [name]  . Crack cocaine use     for 20+ years, has been enrolled in detox programs in the past  . Chondromalacia of medial femoral condyle     Left knee MRI 04/28/12: Chondromalacia of the medial femoral condyle with slight peripheral degeneration of the meniscocapsular junction of the medial meniscus; followed by sports medicine  . Asthma     No PFTs, history of childhood asthma  . Depression     with history of hospitalization for suicidal  ideation  . CAD (coronary artery disease)     a. 06/2013 STEMI/PCI ([facility]): LAD w/ thrombus (treated with BMS), mid 75%, D2 75%; LCX OM2 75%; RCA small, PDA 95%, PLV 95%;  b. 10/2013 Cath/PCI: ISR w/in LAD (Promus DES x 2), borderline OM2 lesion;  c. 01/2014 MV: Intermediate risk, medium-sized distal ant wall infarct w/ very small amt of peri-infarct ischemia. EF 60%.  . Gout 04/28/2012  . Collagen vascular disease ([place])   . Cellulitis 04/2014    left facial  . A-fib [facility])    Past Surgical History  Procedure Laterality Date  . Bowel resection    . Back surgery      1988  . Cardiac surgery    . Cervical spine surgery      " rods in my neck "  . Coronary stent placement    . Nm myocar perf wall motion  12/27/2011    normal  . Spine surgery     Family History  Problem Relation Age of Onset  . Diabetes Mother   . Hypertension Mother   . Hyperlipidemia Mother   . Diabetes Father   . Cancer Father   . Hypertension Father   . Diabetes Brother   . Heart disease Brother   . Colon cancer Neg Hx   . Diabetes Sister    Social History  Substance Use Topics  . Smoking status: Never Smoker   . Smokeless tobacco: Never Used  .  Alcohol Use: No    Review of Systems  Musculoskeletal: Positive for myalgias and back pain.  Neurological: Negative for numbness.  All other systems reviewed and are negative.     Allergies  Review of patient's allergies indicates no known allergies.  Home Medications   Prior to Admission medications   Medication Sig Start Date End Date Taking? Authorizing Provider  allopurinol (ZYLOPRIM) 100 MG tablet Take 2 tablets (200 mg total) by mouth daily. For gouty arthritis 07/10/15   Liberty Handy, MD  amantadine (SYMMETREL) 100 MG capsule Take 1 capsule by mouth daily. 09/22/12   Historical Provider, MD  aspirin EC 81 MG tablet Take 1 tablet (81 mg total) by mouth daily. For heart health 09/10/15   Benjamine Mola, FNP  clopidogrel (PLAVIX) 75 MG tablet Take 1 tablet  (75 mg total) by mouth daily. For prevention of stroke 09/10/15   Benjamine Mola, FNP  COMPLERA 200-25-300 MG tablet TAKE 1 TABLET BY MOUTH DAILY. 01/08/16   [name], MD  cyclobenzaprine (FLEXERIL) 10 MG tablet Take 1 tablet (10 mg total) by mouth 2 (two) times daily as needed for muscle spasms. 09/05/15   Courteney Lyn Mackuen, MD  diltiazem (CARDIZEM CD) 120 MG 24 hr capsule Take 1 capsule by mouth daily. 06/13/13   Historical Provider, MD  DULoxetine (CYMBALTA) 30 MG capsule TAKE 1 CAPSULE (30 MG TOTAL) BY MOUTH DAILY. 02/26/16   Norman Herrlich, MD  famotidine (PEPCID) 20 MG tablet Take 1 tablet (20 mg total) by mouth daily. 8pm 09/10/15   Benjamine Mola, FNP  fluticasone (FLONASE) 50 MCG/ACT nasal spray Place 1 spray into both nostrils daily.  10/21/15 10/20/16  Historical Provider, MD  gabapentin (NEURONTIN) 300 MG capsule Take 1 capsule by mouth daily.    Historical Provider, MD  glipiZIDE (GLUCOTROL XL) 2.5 MG 24 hr tablet Take 2.5 mg by mouth 2 (two) times daily. Reported on 01/25/2016    Historical Provider, MD  Insulin Glargine (LANTUS SOLOSTAR) 100 UNIT/ML Solostar Pen Inject 15 Units into the skin at bedtime. 09/10/15   Benjamine Mola, FNP  Insulin Glargine (LANTUS SOLOSTAR) 100 UNIT/ML Solostar Pen Inject 15 Units into the skin daily at 10 pm. 01/30/16   Sid Falcon, MD  isosorbide mononitrate (IMDUR) 30 MG 24 hr tablet TAKE 1 TABLET (30 MG TOTAL) BY MOUTH DAILY. 02/22/16   Mihai Croitoru, MD  losartan (COZAAR) 50 MG tablet Take 1 tablet by mouth daily. 08/15/15   Historical Provider, MD  metFORMIN (GLUCOPHAGE) 1000 MG tablet Take 1 tablet (1,000 mg total) by mouth 2 (two) times daily with a meal. For diabetes management 12/20/14   Encarnacion Slates, NP  metoprolol succinate (TOPROL-XL) 25 MG 24 hr tablet Take 25 mg by mouth daily.  10/17/15   Historical Provider, MD  naproxen (NAPROSYN) 500 MG tablet Take 1 tablet (500 mg total) by mouth 2 (two) times daily. 09/10/15   Benjamine Mola, FNP   oxyCODONE-acetaminophen (PERCOCET) 5-325 MG tablet Take 1-2 tablets by mouth every 4 (four) hours as needed. 03/12/16   Orpah Greek, MD  polyethylene glycol [facility]) packet Take 17 g by mouth daily. 09/10/15   Benjamine Mola, FNP  pravastatin (PRAVACHOL) 40 MG tablet Take 1 tablet (40 mg total) by mouth daily. For high cholesterol 12/20/14   Encarnacion Slates, NP  PROAIR HFA 108 (90 BASE) MCG/ACT inhaler INHALE TWO   PUFFS INTO THE LUNGS EVERY 6 (SIX) HOURS AS NEEDED FOR WHEEZING  OR SHORTNESS OF BREATH. 09/04/15   Norman Herrlich, MD  risperiDONE (RISPERDAL) 1 MG tablet Take 1 mg by mouth at bedtime.     Historical Provider, MD  topiramate (TOPAMAX) 50 MG tablet Take 50 mg by mouth daily.  10/05/15 10/04/16  Historical Provider, MD  traZODone (DESYREL) 50 MG tablet Take 50 mg by mouth at bedtime.     Historical Provider, MD   Triage Vitals: BP 161/81 mmHg  Pulse 99  Temp(Src) 98.4 F (36.9 C) (Oral)  Resp 18  SpO2 99% Physical Exam  Constitutional: He is oriented to person, place, and time. He appears well-developed and well-nourished. No distress.  HENT:  Head: Normocephalic and atraumatic.  Right Ear: Hearing normal.  Left Ear: Hearing normal.  Nose: Nose normal.  Mouth/Throat: Oropharynx is clear and moist and mucous membranes are normal.  Eyes: Conjunctivae and EOM are normal. Pupils are equal, round, and reactive to light.  Neck: Normal range of motion. Neck supple.  Cardiovascular: Regular rhythm, S1 normal and S2 normal.  Exam reveals no gallop and no friction rub.   No murmur heard. Pulmonary/Chest: Effort normal and breath sounds normal. No respiratory distress. He exhibits no tenderness.  Abdominal: Soft. Normal appearance and bowel sounds are normal. There is no hepatosplenomegaly. There is no tenderness. There is no rebound, no guarding, no tenderness at McBurney's point and negative Murphy's sign. No hernia.  Musculoskeletal: Normal range of motion. He exhibits  tenderness.  Left lumbar tenderness in midline back Positive straight leg raise with left greater than right 2+ DP pulses bilaterally  Neurological: He is alert and oriented to person, place, and time. He has normal strength. No cranial nerve deficit or sensory deficit. Coordination normal. GCS eye subscore is 4. GCS verbal subscore is 5. GCS motor subscore is 6.  Skin: Skin is warm, dry and intact. No rash noted. No cyanosis.  Psychiatric: He has a normal mood and affect. His speech is normal and behavior is normal. Thought content normal.  Nursing note and vitals reviewed.   ED Course  Procedures DIAGNOSTIC STUDIES: Oxygen Saturation is 99% on RA,normal by my interpretation.  COORDINATION OF CARE: 3:46 AM Discussed treatment plan which includes dilaudid and zofran with pt at bedside and pt agreed to plan.  Labs Review Labs Reviewed  CBC WITH DIFFERENTIAL/PLATELET - Abnormal; Notable for the following:    Hemoglobin 10.9 (*)    HCT 35.0 (*)    MCH 25.1 (*)    Platelets 147 (*)    All other components within normal limits  BASIC METABOLIC PANEL - Abnormal; Notable for the following:    Glucose, Bld 291 (*)    Creatinine, Ser 1.33 (*)    GFR calc non Af Amer 55 (*)    All other components within normal limits  URINALYSIS, ROUTINE W REFLEX MICROSCOPIC (NOT AT [facility]) - Abnormal; Notable for the following:    Color, Urine STRAW (*)    Glucose, UA >1000 (*)    Ketones, ur 15 (*)    All other components within normal limits  URINE MICROSCOPIC-ADD ON - Abnormal; Notable for the following:    Squamous Epithelial / LPF 0-5 (*)    Bacteria, UA RARE (*)    All other components within normal limits    Imaging Review Mr Lumbar Spine Wo Contrast  03/12/2016  CLINICAL DATA:  65 year old male with moderate bilateral leg/hip pain onset today. Chronic lower back pain. Initial encounter. EXAM: MRI LUMBAR SPINE WITHOUT CONTRAST TECHNIQUE: Multiplanar, multisequence  MR imaging of the lumbar spine  was performed. No intravenous contrast was administered. COMPARISON:  09/21/2015 CT abdomen pelvis. 09/05/2015 lumbar spine MR. FINDINGS: Segmentation: Last fully open disk space is labeled L5-S1. Present examination incorporates from T11-12 disc space through the mid lower sacrum. Alignment:  4.6 mm anterior slip L4. Vertebrae: Scattered small Schmorl's node deformities. Slightly heterogeneous bone marrow without worrisome lesion. Conus medullaris: Extends to the just below the T12-L1 disc space level and appears normal. Paraspinal and other soft tissues: No worrisome abnormality. Disc levels: T10-11:  Small Schmorl's node deformity. T11-12: Minimal Schmorl's node deformity. T12-L1: Small Schmorl's node deformity. Mild facet degenerative changes. L1-2: Small Schmorl's node deformity. Minimal bulge. Minimal facet degenerative changes. Short pedicles. Mild to slightly moderate right-sided and mild left-sided foraminal narrowing. No significant spinal stenosis. L2-3: Schmorl's node deformity. Facet degenerative changes greater on left. Mild ligamentum flavum hypertrophy. Bulge slightly greater left foraminal position. Mild to moderate foraminal narrowing greater on the left. No significant spinal stenosis. L3-4: Schmorl's node deformity. Bulge. Osteophyte. Facet degenerative changes. Mild ligamentum flavum hypertrophy. Multifactorial moderate left-sided and mild right-sided lateral recess stenosis. Mild to moderate bilateral foraminal narrowing. Very mild thecal sac narrowing. L4-5: Prominent bilateral facet degenerative changes and bony overgrowth. Ligamentum flavum hypertrophy. 4.6 mm anterior slip L4. Bulge/protrusion with cephalad and foraminal extension. Short pedicles. Multifactorial severe spinal stenosis/bilateral lateral recess stenosis and marked foraminal narrowing greater on the right. L5-S1: Post surgery with metallic artifact arising from posterior elements limiting evaluation without obvious foraminal  narrowing or spinal stenosis. IMPRESSION: Similar appearance to prior exam with most notable finding the L4-5 multifactorial severe spinal stenosis/ bilateral lateral recess stenosis and marked bilateral foraminal narrowing. Please see above for further detail. Electronically Signed   By: Genia Del M.D.   On: 03/12/2016 08:39   I have personally reviewed and evaluated these images and lab results as part of my medical decision-making.   EKG Interpretation None      MDM   Final diagnoses:  Low back pain    Patient presents to the emergency department for evaluation of low back pain with bilateral lower extremity pain and weakness. Patient reports a history of chronic back pain. Examination revealed painful inhibition of elevation of his legs, could not determine if he has weakness. There is no saddle anesthesia or foot drop. Patient was given analgesia and an attempt to ambulate revealed that he was still feeling weak. Patient therefore underwent MRI of lumbar spine. No change from previous, no evidence of acute abnormality. We'll arrange for case management to see the patient to help with home health needs and then discharge patient  I personally performed the services described in this documentation, which was scribed in my presence. The recorded information has been reviewed and is accurate.    Orpah Greek, MD 03/13/16 [phone]

## 2016-03-12 NOTE — Discharge Instructions (Signed)

## 2016-03-18 ENCOUNTER — Other Ambulatory Visit: Payer: Self-pay

## 2016-03-18 MED ORDER — LOSARTAN POTASSIUM 50 MG PO TABS: 50.0000 mg | ORAL_TABLET | Freq: Every day | ORAL | Status: DC

## 2016-03-21 ENCOUNTER — Other Ambulatory Visit: Payer: Self-pay

## 2016-03-21 NOTE — Patient Outreach (Signed)
[facility]) Care Management  03/21/2016  Michael [name] [birth year] [id number]  Patient has not responded to calls or letter.   Plan: RN Health Coach will notify physician of case closure. RN Health Coach will notify care management assistant of case closure.  [name], RN, MSN [place] [phone]

## 2016-04-08 ENCOUNTER — Other Ambulatory Visit: Payer: Self-pay | Admitting: *Deleted

## 2016-04-08 DIAGNOSIS — I2510 Atherosclerotic heart disease of native coronary artery without angina pectoris: Secondary | ICD-10-CM

## 2016-04-08 DIAGNOSIS — E1165 Type 2 diabetes mellitus with hyperglycemia: Secondary | ICD-10-CM

## 2016-04-08 DIAGNOSIS — E1140 Type 2 diabetes mellitus with diabetic neuropathy, unspecified: Secondary | ICD-10-CM

## 2016-04-08 DIAGNOSIS — IMO0002 Reserved for concepts with insufficient information to code with codable children: Secondary | ICD-10-CM

## 2016-04-08 DIAGNOSIS — M109 Gout, unspecified: Secondary | ICD-10-CM

## 2016-04-08 NOTE — Telephone Encounter (Signed)
Asked to assist patient in getting a new meter to check his blood sugar. [place], they said he got a Meter, strips, lancets accuchek aviva plus in March. He was given a sample meter from our office.

## 2016-04-08 NOTE — Telephone Encounter (Signed)
Pt presents as a WALK IN stating he went to a park restroom and left his meds there when he came back they were gone, he is overdue for an appt, asking for 2 weeks of his most important meds til he is seen Will ask [employer]nap. To help with diab supplies

## 2016-04-10 MED ORDER — ALBUTEROL SULFATE HFA 108 (90 BASE) MCG/ACT IN AERS: 2.0000 | INHALATION_SPRAY | Freq: Four times a day (QID) | RESPIRATORY_TRACT | Status: DC | PRN

## 2016-04-10 MED ORDER — DILTIAZEM HCL ER COATED BEADS 120 MG PO CP24: 120.0000 mg | ORAL_CAPSULE | Freq: Every day | ORAL | Status: DC

## 2016-04-10 MED ORDER — METOPROLOL SUCCINATE ER 25 MG PO TB24: 25.0000 mg | ORAL_TABLET | Freq: Every day | ORAL | Status: DC

## 2016-04-10 MED ORDER — GLUCOSE BLOOD VI STRP
ORAL_STRIP | Status: DC
Start: 2016-04-10 — End: 2016-11-22

## 2016-04-10 MED ORDER — FAMOTIDINE 20 MG PO TABS: 20.0000 mg | ORAL_TABLET | Freq: Every day | ORAL | Status: DC

## 2016-04-10 MED ORDER — INSULIN PEN NEEDLE 31G X 5 MM MISC: Status: DC

## 2016-04-10 MED ORDER — ACCU-CHEK SOFTCLIX LANCET DEV MISC: Status: DC

## 2016-04-10 MED ORDER — RISPERIDONE 1 MG PO TABS: 1.0000 mg | ORAL_TABLET | Freq: Every day | ORAL | Status: DC

## 2016-04-10 MED ORDER — METFORMIN HCL 1000 MG PO TABS: 1000.0000 mg | ORAL_TABLET | Freq: Two times a day (BID) | ORAL | Status: DC

## 2016-04-10 MED ORDER — GLIPIZIDE ER 2.5 MG PO TB24: 2.5000 mg | ORAL_TABLET | Freq: Two times a day (BID) | ORAL | Status: DC

## 2016-04-10 MED ORDER — INSULIN GLARGINE 100 UNIT/ML SOLOSTAR PEN: 15.0000 [IU] | PEN_INJECTOR | Freq: Every day | SUBCUTANEOUS | Status: DC

## 2016-04-10 MED ORDER — PRAVASTATIN SODIUM 40 MG PO TABS: 40.0000 mg | ORAL_TABLET | Freq: Every day | ORAL | Status: DC

## 2016-04-10 MED ORDER — ACCU-CHEK AVIVA PLUS W/DEVICE KIT: PACK | Status: DC

## 2016-04-10 MED ORDER — EMTRICITAB-RILPIVIR-TENOFOV DF 200-25-300 MG PO TABS: 1.0000 | ORAL_TABLET | Freq: Every day | ORAL | Status: DC

## 2016-04-10 MED ORDER — DULOXETINE HCL 30 MG PO CPEP: 30.0000 mg | ORAL_CAPSULE | Freq: Every day | ORAL | Status: DC

## 2016-04-10 MED ORDER — LOSARTAN POTASSIUM 50 MG PO TABS: 50.0000 mg | ORAL_TABLET | Freq: Every day | ORAL | Status: DC

## 2016-04-10 MED ORDER — CLOPIDOGREL BISULFATE 75 MG PO TABS: 75.0000 mg | ORAL_TABLET | Freq: Every day | ORAL | Status: DC

## 2016-04-10 MED ORDER — ISOSORBIDE MONONITRATE ER 30 MG PO TB24: 30.0000 mg | ORAL_TABLET | Freq: Every day | ORAL | Status: DC

## 2016-04-14 IMAGING — DX DG CHEST 2V
2 series · 2 of 2 positions shown · non-contrast
Comparison: Chest radiograph performed 12/10/2014

CLINICAL DATA: Acute onset of hypoglycemia.  Initial encounter.

EXAM:
CHEST  2 VIEW

[chest pa]
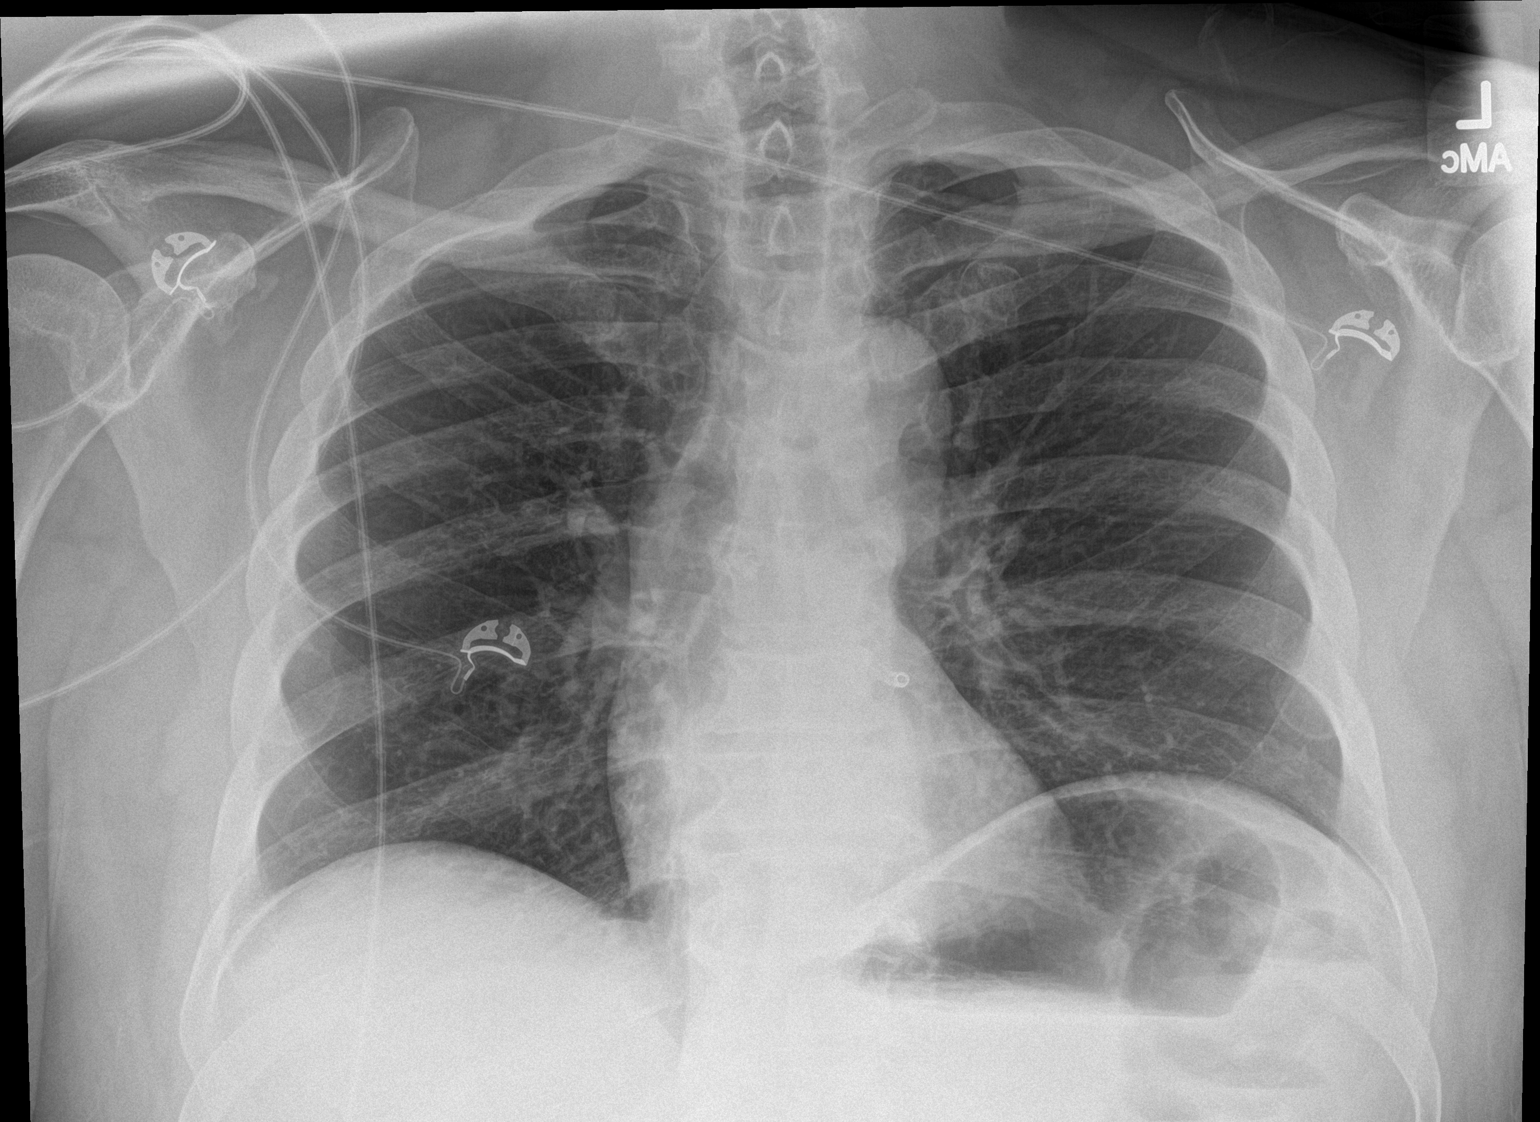

[chest lat]
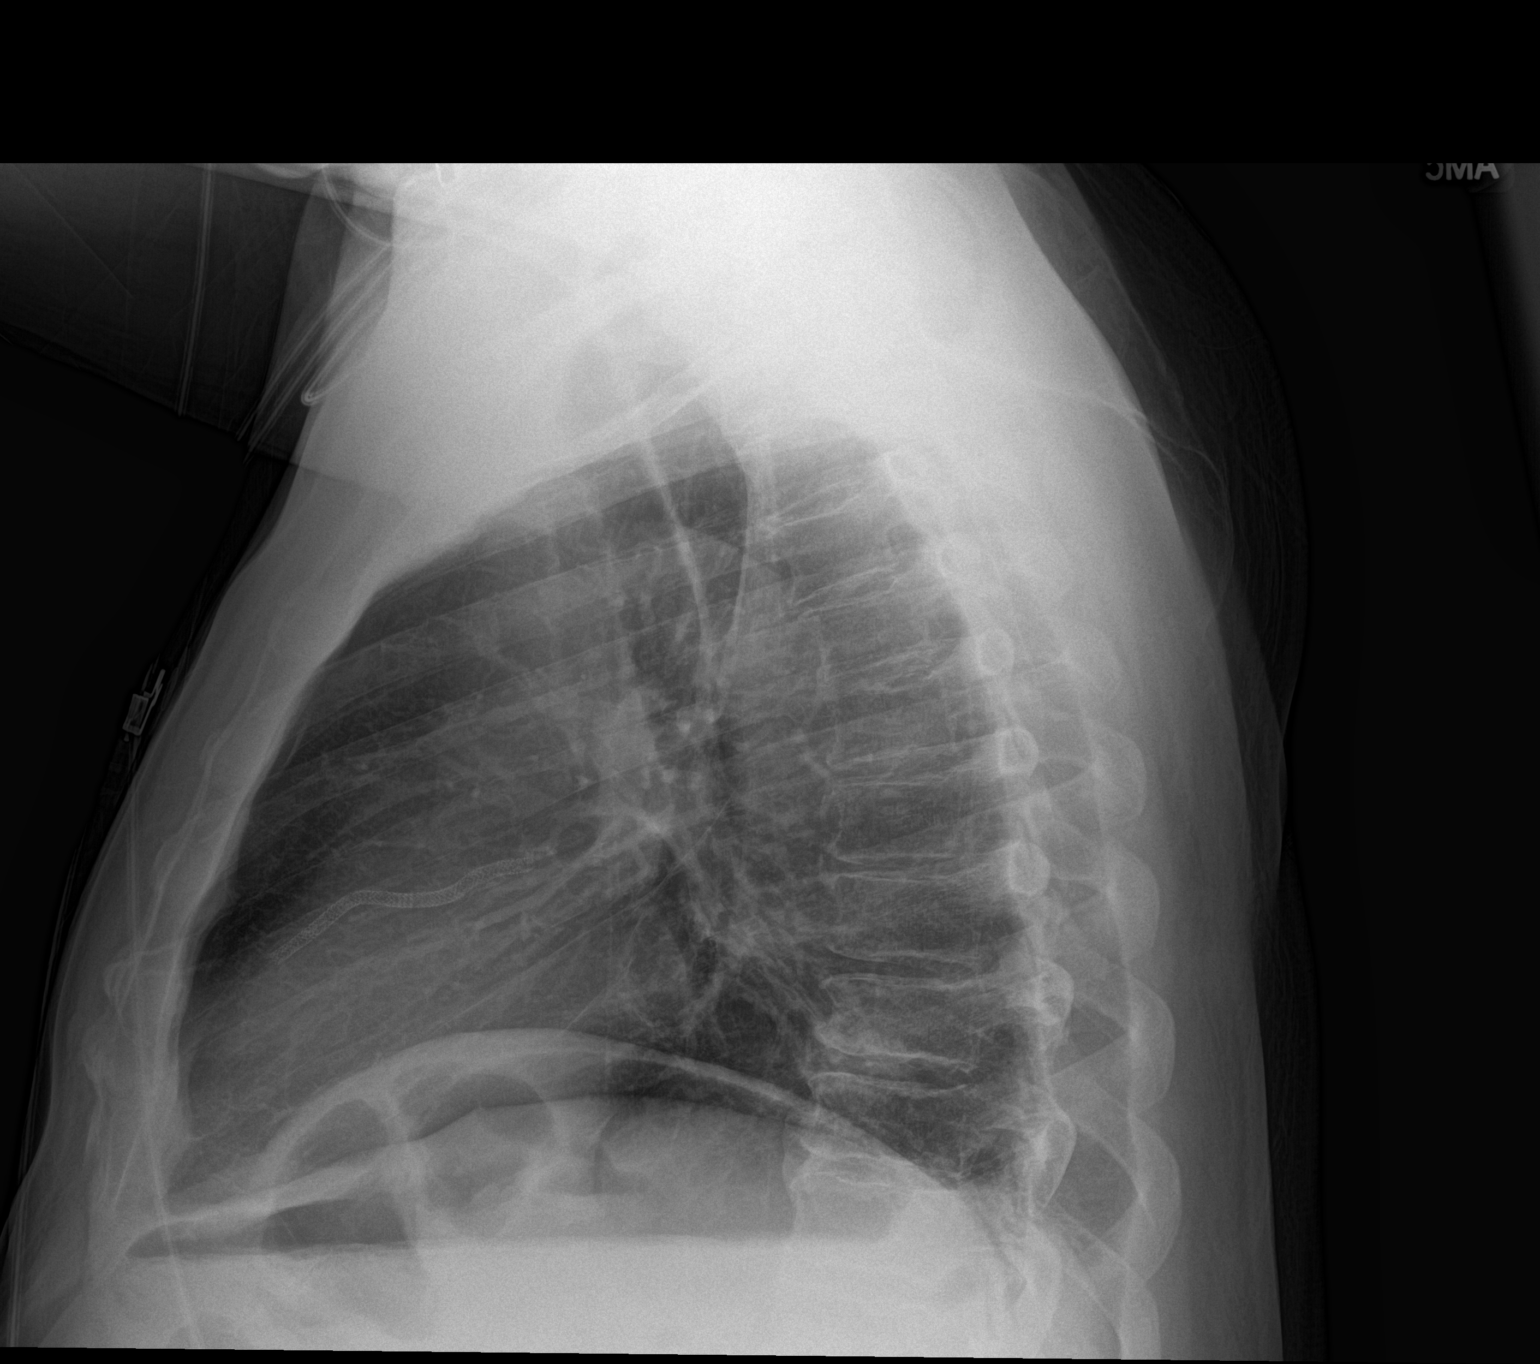

[2 of 2 positions shown; findings below may reference images not displayed]

FINDINGS: The lungs are well-aerated and clear. There is no evidence of focal
opacification, pleural effusion or pneumothorax.

The heart is normal in size; the mediastinal contour is within
normal limits. No acute osseous abnormalities are seen.
IMPRESSION: No acute cardiopulmonary process seen.

## 2016-04-14 IMAGING — DX DG CHEST 2V
2 series · 2 of 2 positions shown · non-contrast
Comparison: 03/06/2015

CLINICAL DATA: SOB x 2 days; productive cough, right and left
shoulder pain with left side chest pain x 2 wks. Hx of asthma, HTN,
diabetes.

EXAM:
CHEST  2 VIEW

[chest pa]
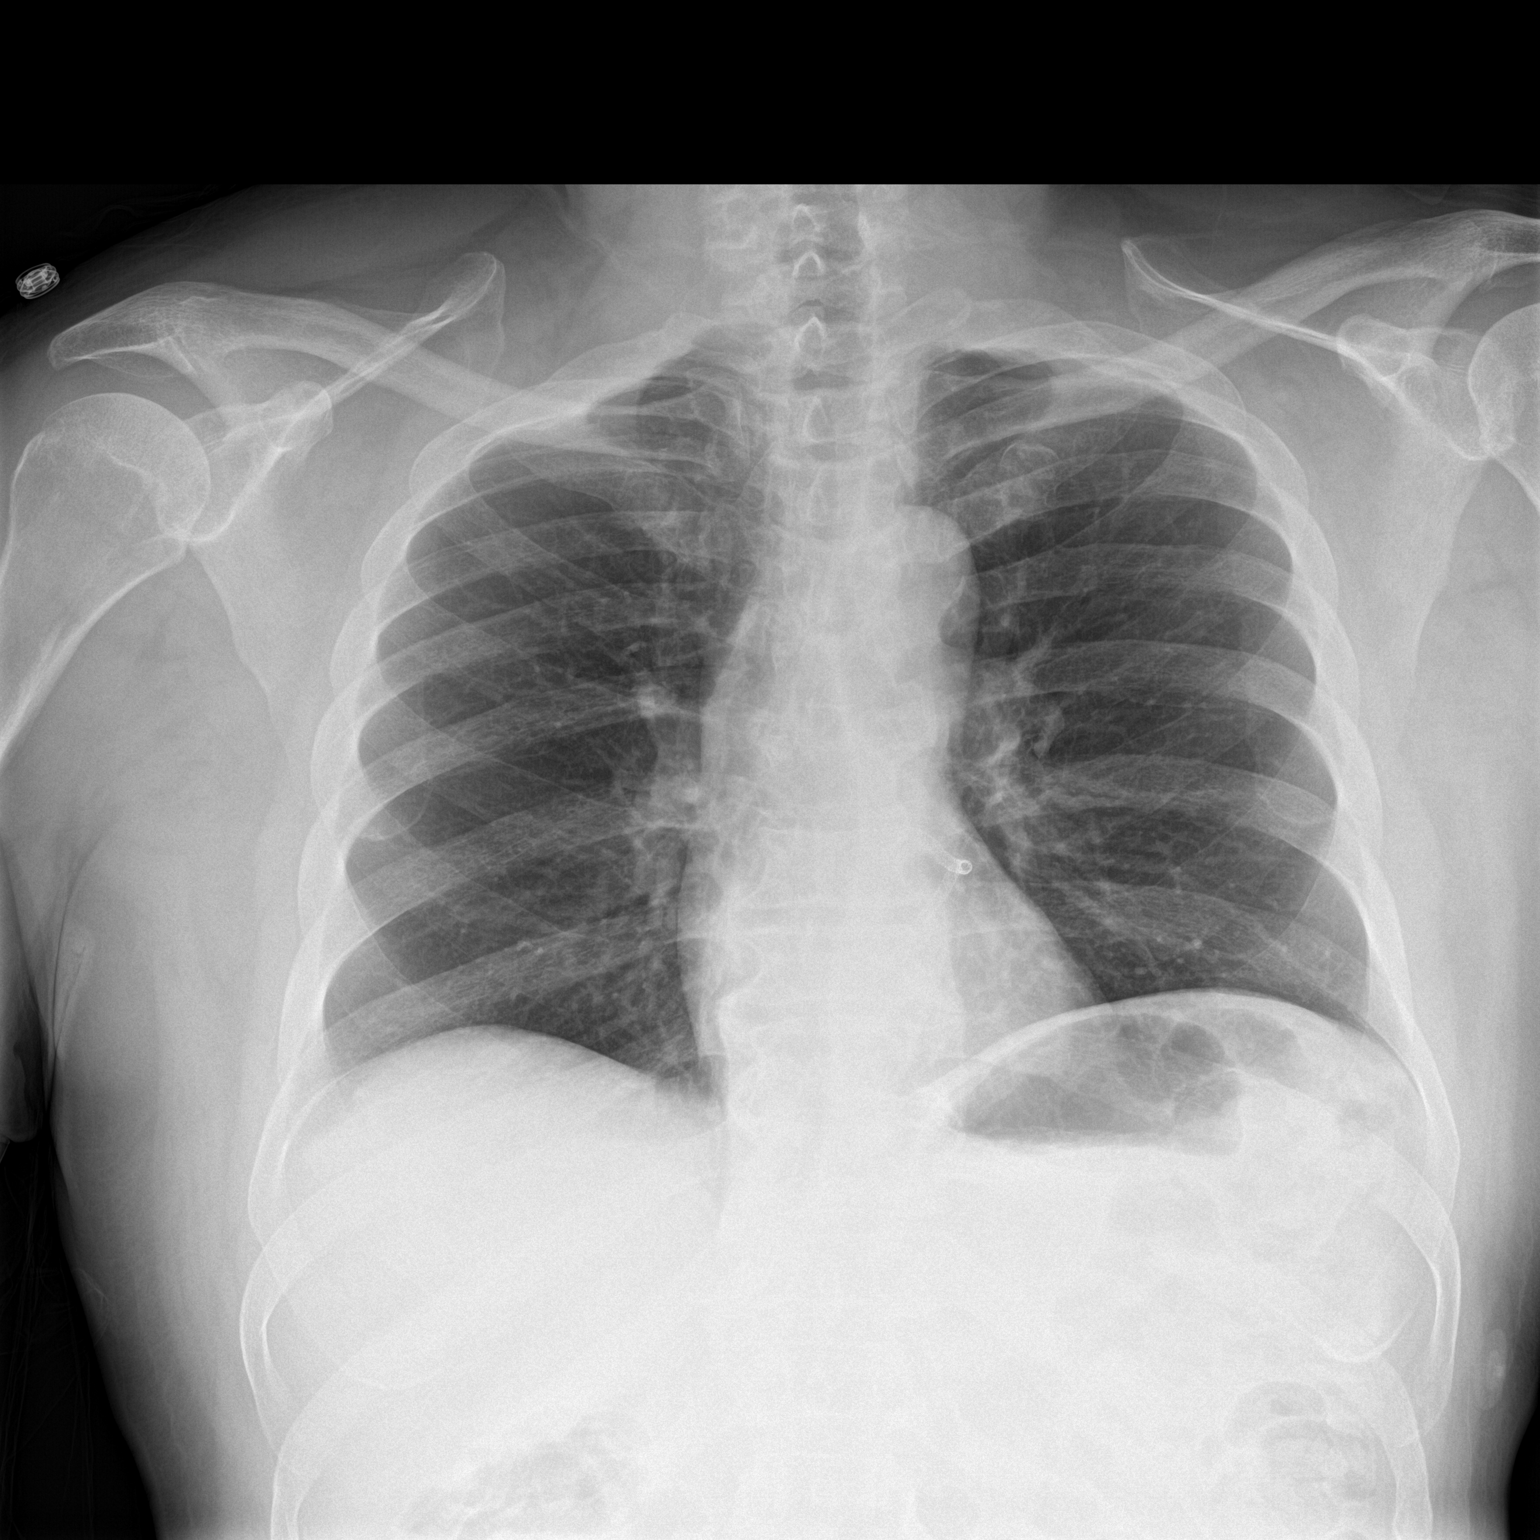

[chest lat]
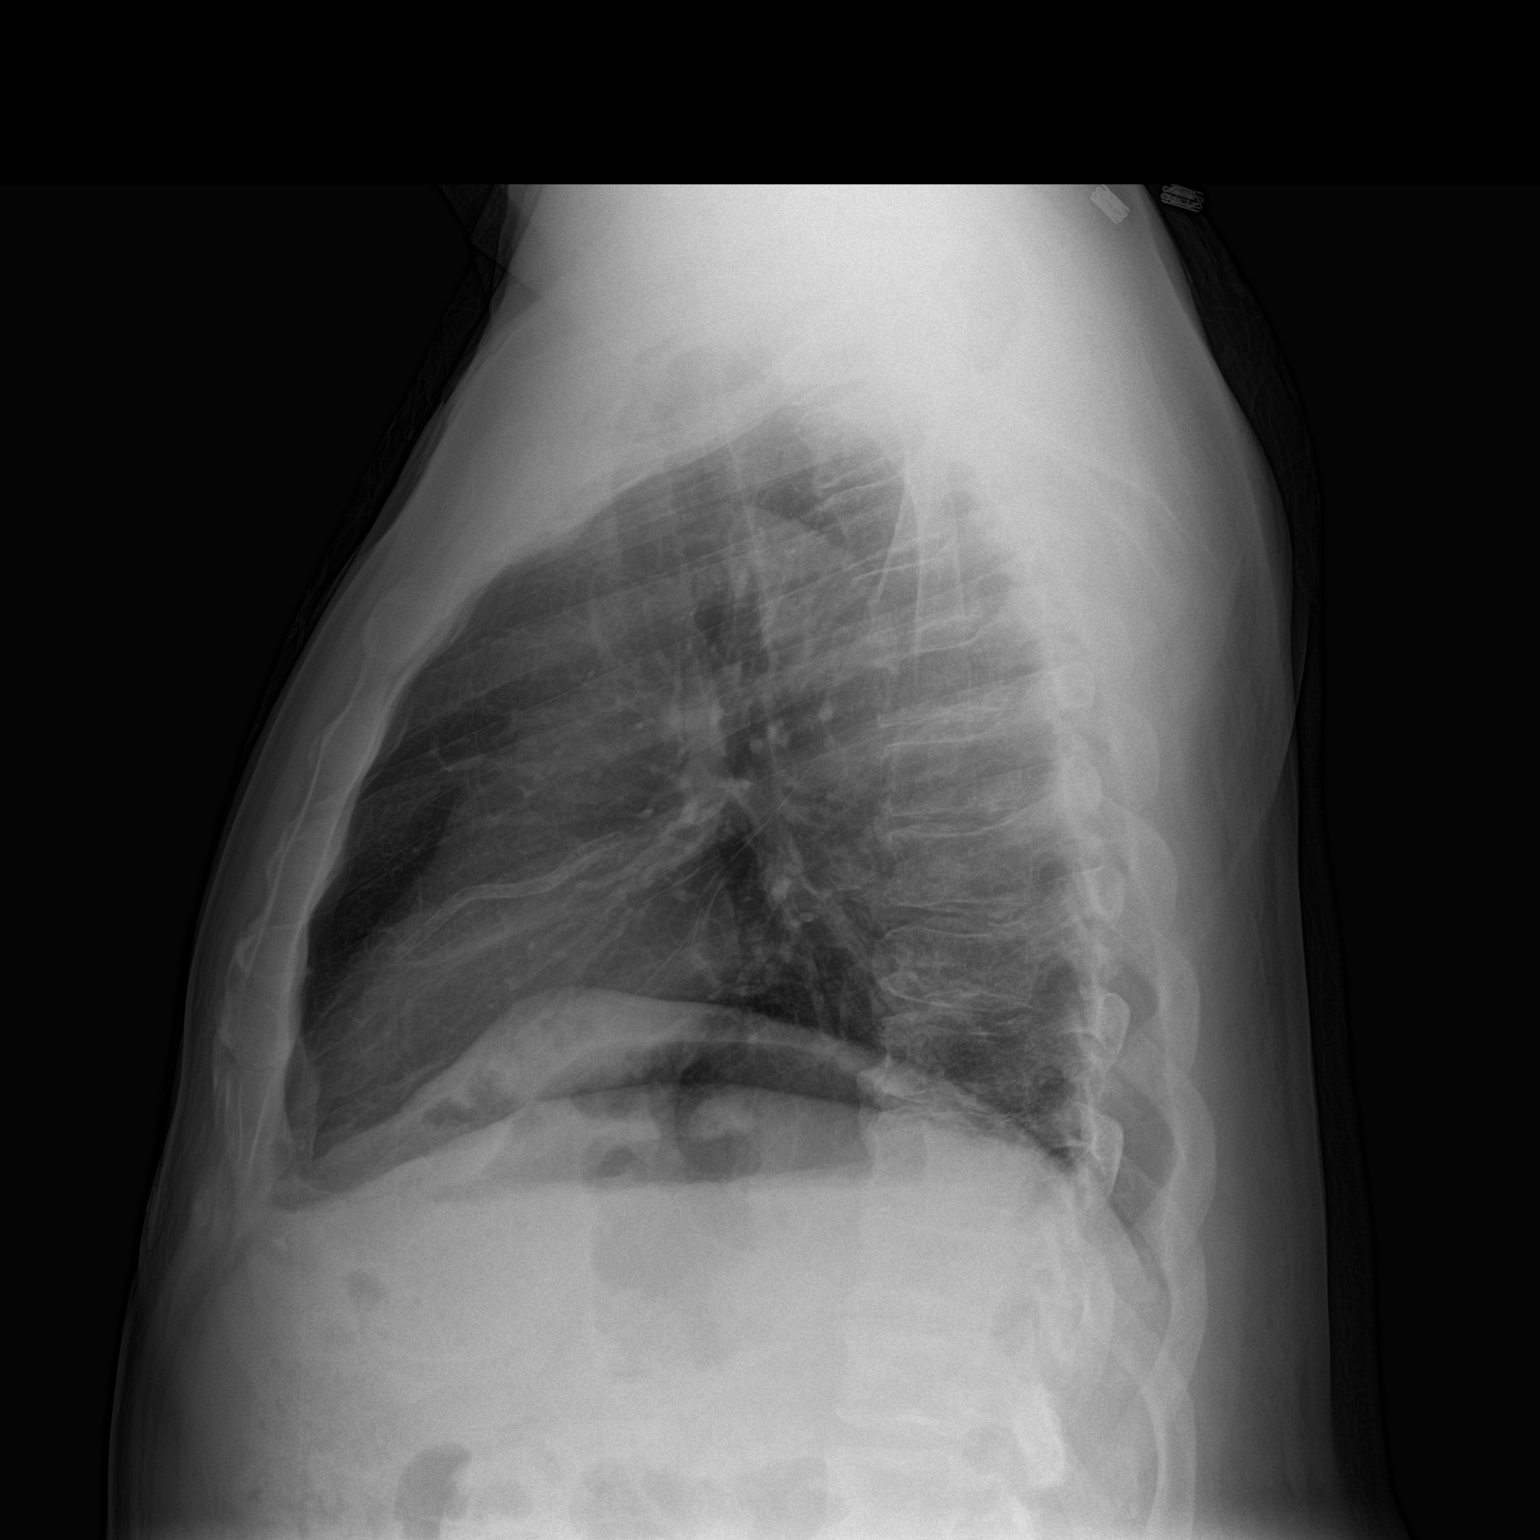

[2 of 2 positions shown; findings below may reference images not displayed]

FINDINGS: Cardiac silhouette normal in size. There is a long coronary artery
stent, stable. Normal mediastinal and hilar contours.

Clear lungs.  No pleural effusion or pneumothorax.

Bony thorax is demineralized but grossly intact.
IMPRESSION: No active cardiopulmonary disease.

## 2016-04-14 IMAGING — CT CT ANGIO CHEST
2 of 7 series · 18 of 46 positions shown · IV contrast (Omni 300)
Comparison: Chest radiograph status 03/06/2015.

CLINICAL DATA: Shortness of breath, chest pain, cough

EXAM:
CT ANGIOGRAPHY CHEST WITH CONTRAST
TECHNIQUE: Multidetector CT imaging of the chest was performed using the
standard protocol during bolus administration of intravenous
contrast. Multiplanar CT image reconstructions and MIPs were
obtained to evaluate the vascular anatomy.
CONTRAST:  100mL OMNIPAQUE IOHEXOL 350 MG/ML SOLN

[Series 5: thins · axial · 0.69mm/px · z∈[+1194,+1425]mm · 15 of 254 slices shown]
[im 12/254  lung]
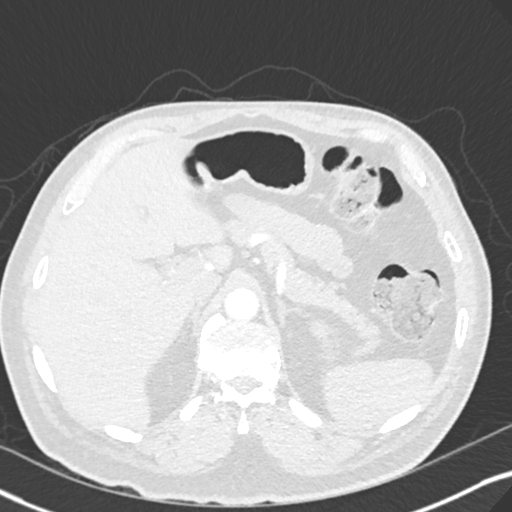
[im 34/254  soft-tissue]
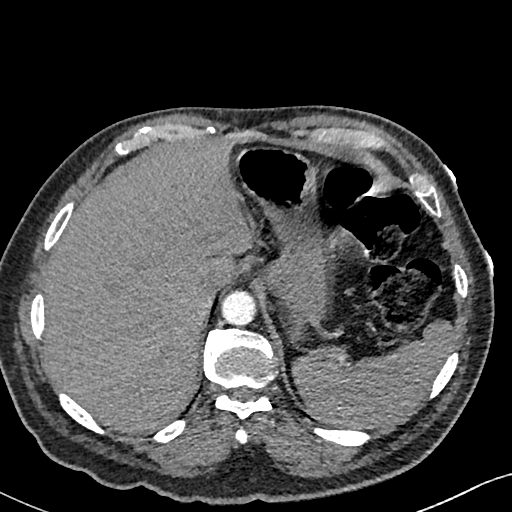
[im 45/254  lung]
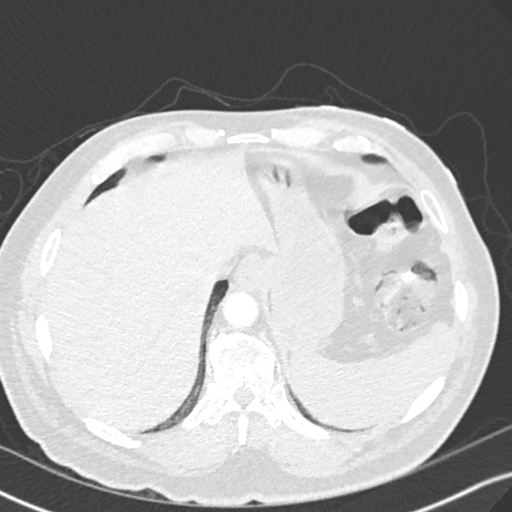
[im 67/254  soft-tissue]
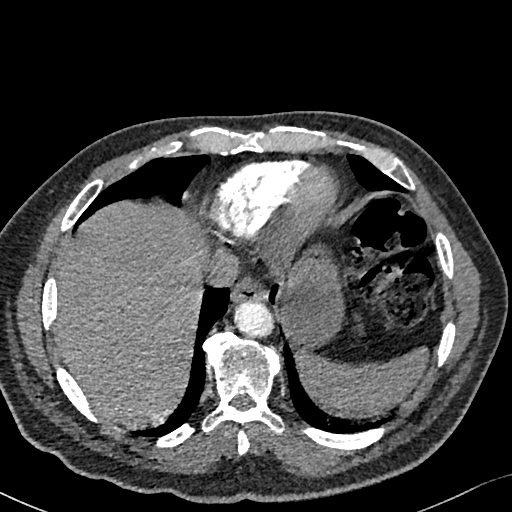
[im 78/254  lung]
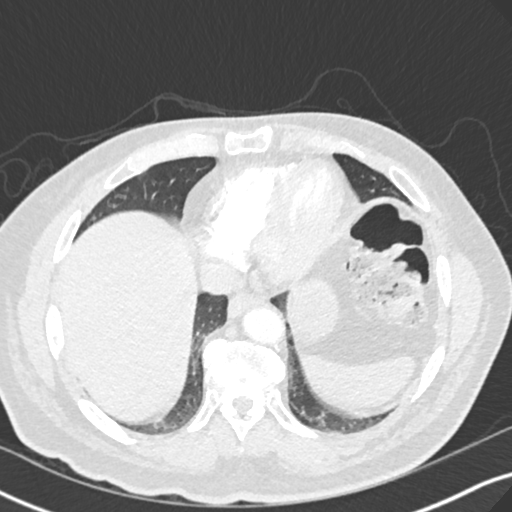
[im 100/254  soft-tissue]
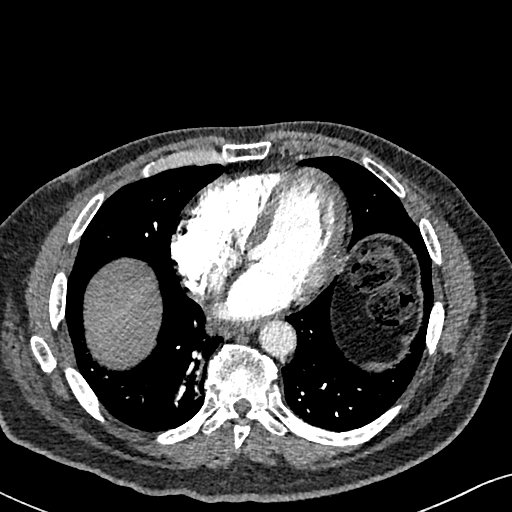
[im 111/254  lung]
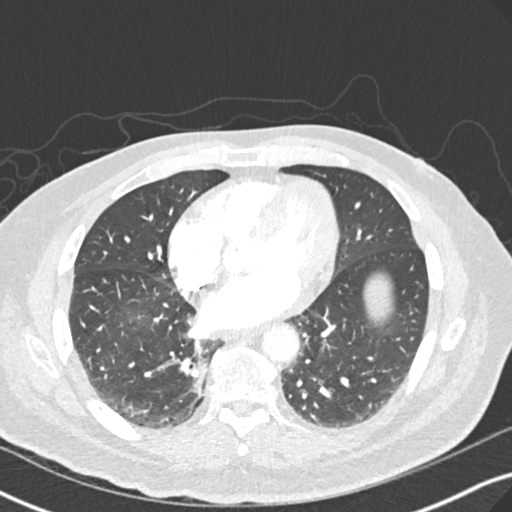
[im 133/254  soft-tissue]
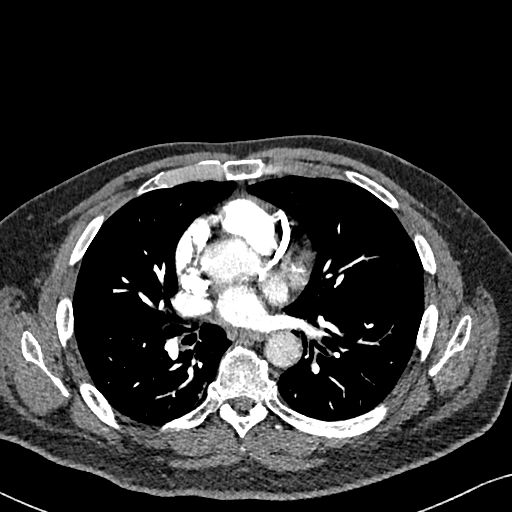
[im 144/254  lung]
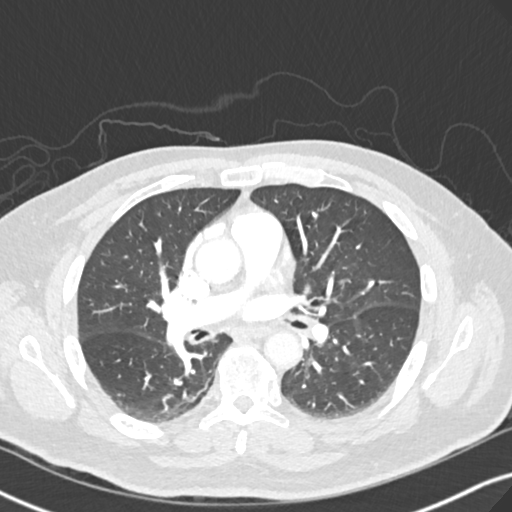
[im 155/254  soft-tissue]
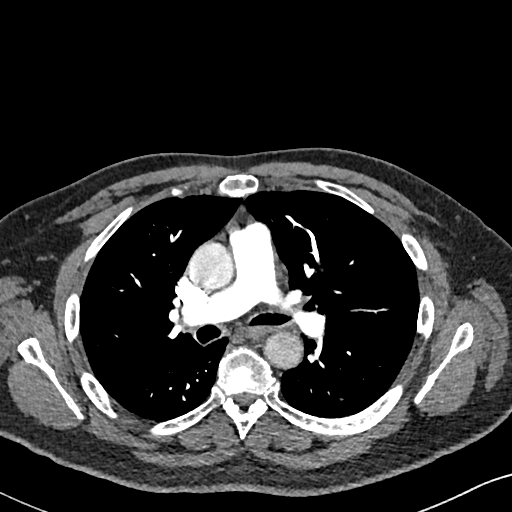
[im 177/254  lung]
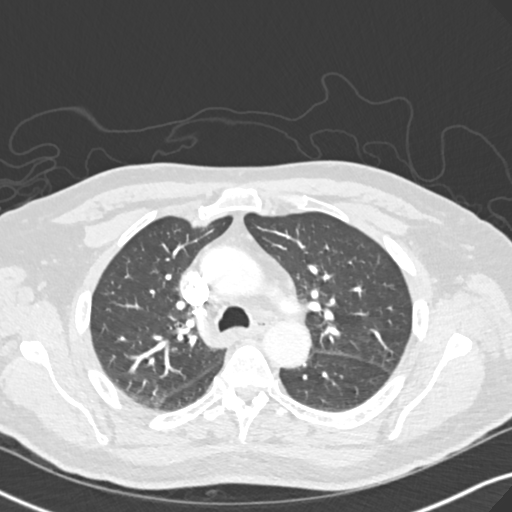
[im 188/254  soft-tissue]
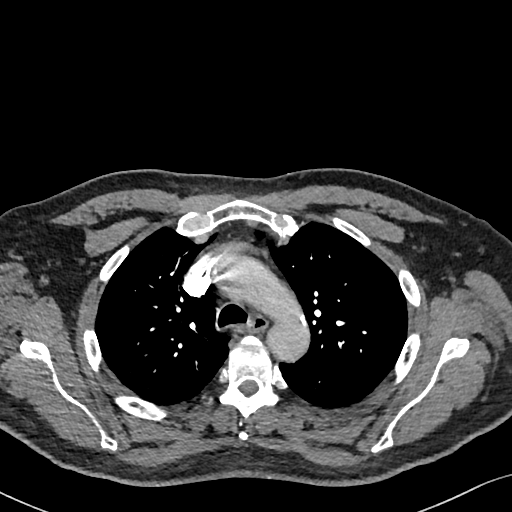
[im 210/254  lung]
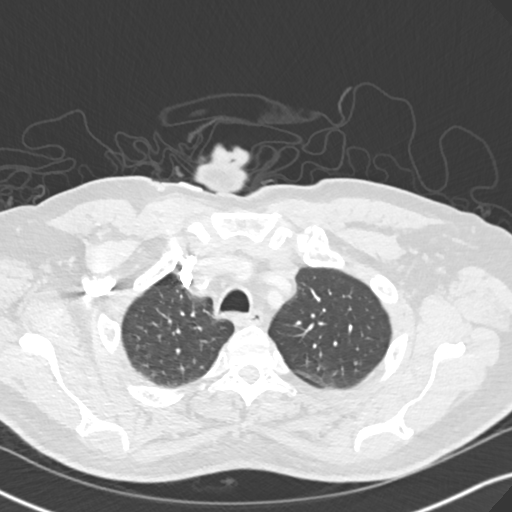
[im 221/254  soft-tissue]
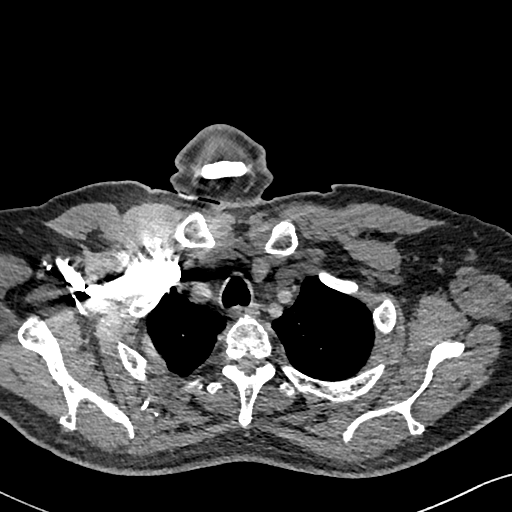
[im 243/254  lung]
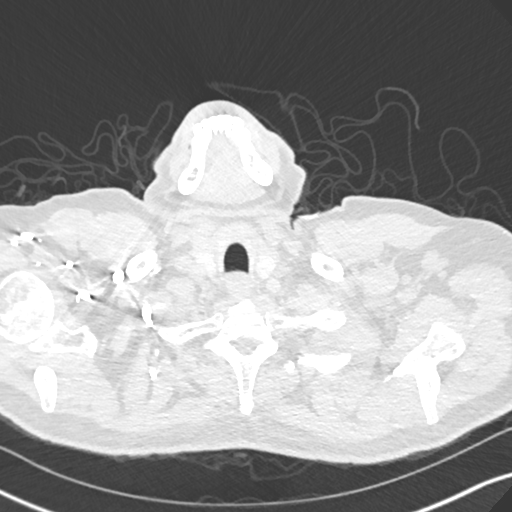

[Series 7: coronal mpr · coronal · 0.51mm/px · 3 of 131 slices shown]
[im 33/131  soft-tissue]
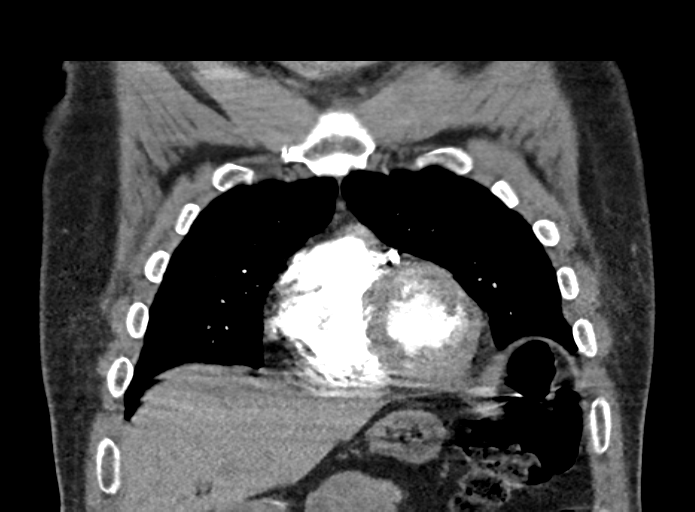
[im 66/131  soft-tissue]
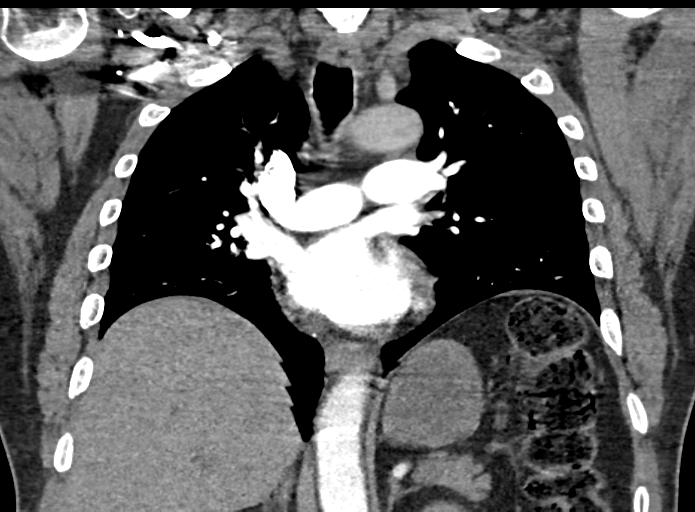
[im 98/131  soft-tissue]
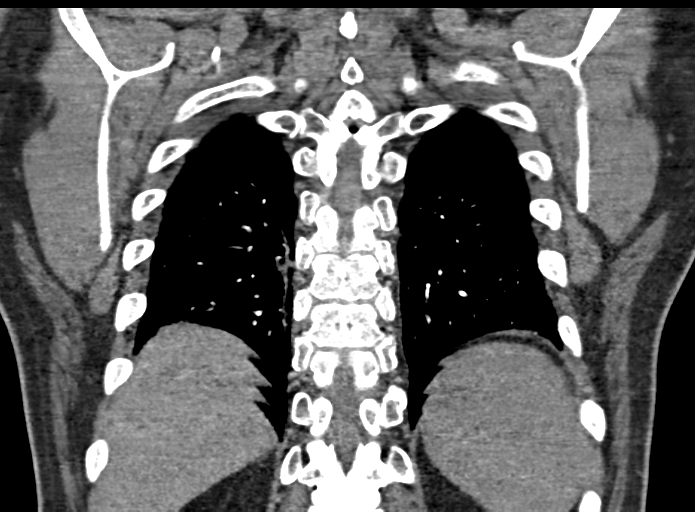

[18 of 46 positions shown; findings below may reference images not displayed]

FINDINGS: No evidence of pulmonary embolism.

Mediastinum/Nodes: Heart is normal in size. No pericardial effusion.

Coronary atherosclerosis.  Coronary stent.

Mild atherosclerotic calcifications of the aortic arch.

No suspicious mediastinal, hilar, or axillary lymphadenopathy.

Visualized thyroid is unremarkable.

Lungs/Pleura: Focal patchy opacity in the medial right lower lobe
(series 6/ image 45), likely subpleural atelectasis/scarring, less
likely infectious/inflammatory.

No focal consolidation.

No suspicious pulmonary nodules.

No pleural effusion or pneumothorax.

Upper abdomen: Visualized upper abdomen is within normal limits.

Musculoskeletal: Degenerative changes of the visualized
thoracolumbar spine.

Review of the MIP images confirms the above findings.
IMPRESSION: No evidence of pulmonary embolism.

Probable subpleural atelectasis versus scarring in the medial right
lower lobe.

No evidence of acute cardiopulmonary disease.

## 2016-04-17 ENCOUNTER — Encounter: Payer: Self-pay | Admitting: Internal Medicine

## 2016-04-17 ENCOUNTER — Ambulatory Visit (INDEPENDENT_AMBULATORY_CARE_PROVIDER_SITE_OTHER): Payer: Medicare Other | Admitting: Internal Medicine

## 2016-04-17 ENCOUNTER — Telehealth: Payer: Self-pay | Admitting: Dietician

## 2016-04-17 VITALS — BP 135/81 | HR 96 | Temp 98.1°F | Wt 175.9 lb

## 2016-04-17 DIAGNOSIS — E1140 Type 2 diabetes mellitus with diabetic neuropathy, unspecified: Secondary | ICD-10-CM | POA: Diagnosis not present

## 2016-04-17 DIAGNOSIS — IMO0002 Reserved for concepts with insufficient information to code with codable children: Secondary | ICD-10-CM

## 2016-04-17 DIAGNOSIS — E1165 Type 2 diabetes mellitus with hyperglycemia: Secondary | ICD-10-CM

## 2016-04-17 DIAGNOSIS — M545 Low back pain: Secondary | ICD-10-CM | POA: Diagnosis not present

## 2016-04-17 LAB — HM DIABETES EYE EXAM

## 2016-04-17 LAB — GLUCOSE, CAPILLARY: Glucose-Capillary: 248 mg/dL — ABNORMAL HIGH (ref 65–99)

## 2016-04-17 LAB — POCT GLYCOSYLATED HEMOGLOBIN (HGB A1C): Hemoglobin A1C: 13.3

## 2016-04-17 MED ORDER — DICLOFENAC SODIUM 1 % TD GEL: 2.0000 g | Freq: Four times a day (QID) | TRANSDERMAL | Status: DC

## 2016-04-17 MED ORDER — GLIPIZIDE ER 5 MG PO TB24: 5.0000 mg | ORAL_TABLET | Freq: Every day | ORAL | Status: DC

## 2016-04-17 NOTE — Patient Instructions (Addendum)
Please check your blood sugars three times a day and bring your glucometer with you to your next visit.   I changed your glipizide dose to 5mg  extended release that you should take once a day. Make sure that you eat regular meals when taking this medication.

## 2016-04-17 NOTE — Telephone Encounter (Signed)
Patient says he has not heard from his pharmacy and has been out of lantus and testing supplies. [place], they said they are ready, they did not have a house to deliver them to. Patient informed and says he will go to the pharmacy to pick them up today.

## 2016-04-18 DIAGNOSIS — M549 Dorsalgia, unspecified: Secondary | ICD-10-CM | POA: Insufficient documentation

## 2016-04-18 NOTE — Progress Notes (Signed)
Subjective:   Patient ID: Michael [name] male   DOB: [birth year] 65 y.o.   MRN: [id number]  HPI: [name] JOURNEY is a 65 y.o. with past medical history as outlined below who presents to clinic for ED follow up and diabetes.   Please see problem list for status of the pt's chronic medical problems.  Past Medical History  Diagnosis Date  . Diabetes mellitus     Diagnosed in 2002, started insulin in 2012  . Hypertension   . Headache(784.0)     CT head 08/2011: Periventricular and subcortical white matter hypodensities are most in keeping with chronic microangiopathic change  . Gout   . Hyperlipidemia   . HIV infection [facility]) Nov 2012    Followed by [name]  . Crack cocaine use     for 20+ years, has been enrolled in detox programs in the past  . Chondromalacia of medial femoral condyle     Left knee MRI 04/28/12: Chondromalacia of the medial femoral condyle with slight peripheral degeneration of the meniscocapsular junction of the medial meniscus; followed by sports medicine  . Asthma     No PFTs, history of childhood asthma  . Depression     with history of hospitalization for suicidal ideation  . CAD (coronary artery disease)     a. 06/2013 STEMI/PCI ([facility]): LAD w/ thrombus (treated with BMS), mid 75%, D2 75%; LCX OM2 75%; RCA small, PDA 95%, PLV 95%;  b. 10/2013 Cath/PCI: ISR w/in LAD (Promus DES x 2), borderline OM2 lesion;  c. 01/2014 MV: Intermediate risk, medium-sized distal ant wall infarct w/ very small amt of peri-infarct ischemia. EF 60%.  . Gout 04/28/2012  . Collagen vascular disease ([place])   . Cellulitis 04/2014    left facial  . A-fib [facility])    Current Outpatient Prescriptions  Medication Sig Dispense Refill  . albuterol (PROAIR HFA) 108 (90 Base) MCG/ACT inhaler Inhale 2 puffs into the lungs every 6 (six) hours as needed for wheezing or shortness of breath. 1 Inhaler 0  . allopurinol (ZYLOPRIM) 100 MG tablet Take 2 tablets (200 mg total) by mouth daily. For gouty  arthritis 60 tablet 3  . amantadine (SYMMETREL) 100 MG capsule Take 1 capsule by mouth daily.    Marland Kitchen aspirin EC 81 MG tablet Take 1 tablet (81 mg total) by mouth daily. For heart health    . Blood Glucose Monitoring Suppl (ACCU-CHEK AVIVA PLUS) w/Device KIT Check blood sugar two times a day 1 kit 0  . clopidogrel (PLAVIX) 75 MG tablet Take 1 tablet (75 mg total) by mouth daily. For prevention of stroke 30 tablet 0  . cyclobenzaprine (FLEXERIL) 10 MG tablet Take 1 tablet (10 mg total) by mouth 2 (two) times daily as needed for muscle spasms. 10 tablet 0  . diclofenac sodium (VOLTAREN) 1 % GEL Apply 2 g topically 4 (four) times daily. 1 Tube 3  . diltiazem (CARDIZEM CD) 120 MG 24 hr capsule Take 1 capsule (120 mg total) by mouth daily. 30 capsule 0  . DULoxetine (CYMBALTA) 30 MG capsule Take 1 capsule (30 mg total) by mouth daily. 30 capsule 0  . emtricitabine-rilpivir-tenofovir DF (COMPLERA) 200-25-300 MG tablet Take 1 tablet by mouth daily. 30 tablet 0  . famotidine (PEPCID) 20 MG tablet Take 1 tablet (20 mg total) by mouth daily. 8pm 14 tablet 0  . fluticasone (FLONASE) 50 MCG/ACT nasal spray Place 1 spray into both nostrils daily.     Marland Kitchen gabapentin (NEURONTIN) 300  MG capsule Take 1 capsule by mouth daily.    Marland Kitchen glipiZIDE (GLUCOTROL XL) 5 MG 24 hr tablet Take 1 tablet (5 mg total) by mouth daily with breakfast. Reported on 01/25/2016 30 tablet 3  . glucose blood (ACCU-CHEK AVIVA PLUS) test strip Check blood sugar two times a day 100 each 5  . Insulin Glargine (LANTUS SOLOSTAR) 100 UNIT/ML Solostar Pen Inject 15 Units into the skin at bedtime. 15 pen 5  . Insulin Glargine (LANTUS SOLOSTAR) 100 UNIT/ML Solostar Pen Inject 15 Units into the skin daily at 10 pm. 15 mL 0  . Insulin Pen Needle 31G X 5 MM MISC Use to inject insulin once time a day 100 each 3  . isosorbide mononitrate (IMDUR) 30 MG 24 hr tablet Take 1 tablet (30 mg total) by mouth daily. 30 tablet 0  . Lancet Devices (ACCU-CHEK SOFTCLIX)  lancets Check blood sugar two times a day 1 each 0  . losartan (COZAAR) 50 MG tablet Take 1 tablet (50 mg total) by mouth daily. 14 tablet 0  . metFORMIN (GLUCOPHAGE) 1000 MG tablet Take 1 tablet (1,000 mg total) by mouth 2 (two) times daily with a meal. For diabetes management 60 tablet 0  . metoprolol succinate (TOPROL-XL) 25 MG 24 hr tablet Take 1 tablet (25 mg total) by mouth daily. 30 tablet 0  . naproxen (NAPROSYN) 500 MG tablet Take 1 tablet (500 mg total) by mouth 2 (two) times daily. 20 tablet 0  . oxyCODONE-acetaminophen (PERCOCET) 5-325 MG tablet Take 1-2 tablets by mouth every 4 (four) hours as needed. 20 tablet 0  . pravastatin (PRAVACHOL) 40 MG tablet Take 1 tablet (40 mg total) by mouth daily. For high cholesterol 14 tablet 0  . risperiDONE (RISPERDAL) 1 MG tablet Take 1 tablet (1 mg total) by mouth at bedtime. 30 tablet 0  . topiramate (TOPAMAX) 50 MG tablet Take 50 mg by mouth daily.     . traZODone (DESYREL) 50 MG tablet Take 50 mg by mouth at bedtime.      No current facility-administered medications for this visit.   Family History  Problem Relation Age of Onset  . Diabetes Mother   . Hypertension Mother   . Hyperlipidemia Mother   . Diabetes Father   . Cancer Father   . Hypertension Father   . Diabetes Brother   . Heart disease Brother   . Colon cancer Neg Hx   . Diabetes Sister    Social History   Social History  . Marital Status: Divorced    Spouse Name: N/A  . Number of Children: N/A  . Years of Education: 12   Occupational History  .  Unemployed    04/2016   Social History Main Topics  . Smoking status: Never Smoker   . Smokeless tobacco: Never Used  . Alcohol Use: No  . Drug Use: Yes    Special: "Crack" cocaine, Cocaine     Comment: last used crack 08/2015.   Marland Kitchen Sexual Activity: Yes     Comment: accepted condoms   Other Topics Concern  . None   Social History Narrative   Currently staying with a friend in [place].  Was staying @ local motel  until a few days ago - left b/c of bed bugs.   Review of Systems: General- neg for weakness or falls-- last fall was 1 month ago when seen in the ED. Cardiac: neg for chest pain GU - positive for nocturia, has to wake up to use the  restroom 3-4 times.   Objective:  Physical Exam: Filed Vitals:   04/17/16 1433  BP: 135/81  Pulse: 96  Temp: 98.1 F (36.7 C)  TempSrc: Oral  Weight: 175 lb 14.4 oz (79.788 kg)  SpO2: 96%   Physical Exam  Constitutional: He appears well-developed and well-nourished. No distress.  HENT:  Head: Normocephalic and atraumatic.  Nose: Nose normal.  Cardiovascular: Normal rate, regular rhythm and normal heart sounds.  Exam reveals no gallop and no friction rub.   No murmur heard. Pulmonary/Chest: Effort normal and breath sounds normal. No respiratory distress. He has no wheezes. He has no rales.  Abdominal: Soft. Bowel sounds are normal. He exhibits no distension. There is no tenderness. There is no rebound and no guarding.  Musculoskeletal: He exhibits tenderness (TTP of mid back along spine, neg for paraspinal tenderness).  Neurological:  2+ patellar reflexes b/l   Skin: Skin is warm and dry. No rash noted. He is not diaphoretic. No erythema. No pallor.    Assessment & Plan:   Please see problem based assessment and plan.

## 2016-04-18 NOTE — Assessment & Plan Note (Signed)
Pt was last seen in the ED for back pain on 6/6. MRI of lumbar spine at that time was positive for L4-5 multifactorial severe spinal stenosis/ bilateral lateral recess stenosis and marked bilateral foraminal narrowing that was stable from MRI in 2016. He follows w/ [facility] and last had a ACDF fusion of C3-7 in 11/2015. He has not followed back up with them. He denies saddle anesthesia and weakness. After ED visit he bought a cane that has helped w/ ambulation, denies any falls since he was last seen in the ED. Patellar reflexes wnl. He has spinal tenderness along midback region.   - given number of [facility] and advised him to follow up with them.  - given rx for voltaren gel for back pain

## 2016-04-18 NOTE — Assessment & Plan Note (Signed)
Lab Results  Component Value Date   HGBA1C 13.3 04/17/2016   HGBA1C 9.1 07/10/2015   HGBA1C 8.4 03/03/2015     Assessment: Diabetes control:  uncontrolled Progress toward A1C goal:   not at goal Comments: Pt has been out of glipizide and lantus. He has been taking metformin 1000mg  BID. He did not bring his glucometer w/ him.   Plan: Medications:  continue current medications Home glucose monitoring: Frequency:  tid Timing:  w meals Instruction/counseling given: reminded to bring blood glucose meter & log to each visit Educational resources provided:   Self management tools provided:   Other plans: f/u in 1 month for glucometer review.

## 2016-04-19 NOTE — Progress Notes (Signed)
Internal Medicine Clinic Attending  Case discussed with Dr. Truong at the time of the visit.  We reviewed the resident's history and exam and pertinent patient test results.  I agree with the assessment, diagnosis, and plan of care documented in the resident's note.  

## 2016-04-21 DIAGNOSIS — G20 Parkinson's disease: Secondary | ICD-10-CM | POA: Diagnosis not present

## 2016-04-21 DIAGNOSIS — Z7902 Long term (current) use of antithrombotics/antiplatelets: Secondary | ICD-10-CM | POA: Diagnosis not present

## 2016-04-21 DIAGNOSIS — I10 Essential (primary) hypertension: Secondary | ICD-10-CM | POA: Diagnosis not present

## 2016-04-21 DIAGNOSIS — Z981 Arthrodesis status: Secondary | ICD-10-CM | POA: Diagnosis not present

## 2016-04-21 DIAGNOSIS — E785 Hyperlipidemia, unspecified: Secondary | ICD-10-CM | POA: Diagnosis not present

## 2016-04-21 DIAGNOSIS — I480 Paroxysmal atrial fibrillation: Secondary | ICD-10-CM | POA: Diagnosis not present

## 2016-04-21 DIAGNOSIS — I248 Other forms of acute ischemic heart disease: Secondary | ICD-10-CM | POA: Diagnosis not present

## 2016-04-21 DIAGNOSIS — F29 Unspecified psychosis not due to a substance or known physiological condition: Secondary | ICD-10-CM | POA: Diagnosis not present

## 2016-04-21 DIAGNOSIS — M4802 Spinal stenosis, cervical region: Secondary | ICD-10-CM | POA: Diagnosis not present

## 2016-04-21 DIAGNOSIS — R0789 Other chest pain: Secondary | ICD-10-CM | POA: Diagnosis not present

## 2016-04-21 DIAGNOSIS — I2510 Atherosclerotic heart disease of native coronary artery without angina pectoris: Secondary | ICD-10-CM | POA: Diagnosis not present

## 2016-04-21 DIAGNOSIS — E1122 Type 2 diabetes mellitus with diabetic chronic kidney disease: Secondary | ICD-10-CM | POA: Diagnosis not present

## 2016-04-21 DIAGNOSIS — I252 Old myocardial infarction: Secondary | ICD-10-CM | POA: Diagnosis not present

## 2016-04-21 DIAGNOSIS — N179 Acute kidney failure, unspecified: Secondary | ICD-10-CM | POA: Diagnosis not present

## 2016-04-21 DIAGNOSIS — E119 Type 2 diabetes mellitus without complications: Secondary | ICD-10-CM | POA: Diagnosis not present

## 2016-04-21 DIAGNOSIS — J449 Chronic obstructive pulmonary disease, unspecified: Secondary | ICD-10-CM | POA: Diagnosis not present

## 2016-04-21 DIAGNOSIS — Z79899 Other long term (current) drug therapy: Secondary | ICD-10-CM | POA: Diagnosis not present

## 2016-04-21 DIAGNOSIS — E1165 Type 2 diabetes mellitus with hyperglycemia: Secondary | ICD-10-CM | POA: Diagnosis not present

## 2016-04-21 DIAGNOSIS — I5032 Chronic diastolic (congestive) heart failure: Secondary | ICD-10-CM | POA: Diagnosis not present

## 2016-04-21 DIAGNOSIS — N183 Chronic kidney disease, stage 3 (moderate): Secondary | ICD-10-CM | POA: Diagnosis not present

## 2016-04-21 DIAGNOSIS — M109 Gout, unspecified: Secondary | ICD-10-CM | POA: Diagnosis not present

## 2016-04-21 DIAGNOSIS — R079 Chest pain, unspecified: Secondary | ICD-10-CM | POA: Diagnosis not present

## 2016-04-21 DIAGNOSIS — I130 Hypertensive heart and chronic kidney disease with heart failure and stage 1 through stage 4 chronic kidney disease, or unspecified chronic kidney disease: Secondary | ICD-10-CM | POA: Diagnosis not present

## 2016-04-21 DIAGNOSIS — I420 Dilated cardiomyopathy: Secondary | ICD-10-CM | POA: Diagnosis not present

## 2016-05-09 ENCOUNTER — Telehealth: Payer: Self-pay | Admitting: *Deleted

## 2016-05-09 NOTE — Telephone Encounter (Signed)
Pt. Received call from [employer] telling him that it was time for him to have his A1C checked. Informed patient that we just checked in 04/17/16 and that he would not be due for another 3 months. Pt. Michael [name] did not give him any contact information.

## 2016-05-15 ENCOUNTER — Other Ambulatory Visit: Payer: Self-pay | Admitting: Internal Medicine

## 2016-05-28 IMAGING — DX DG CHEST 1V PORT
1 series · 1 of 1 positions shown · non-contrast
Comparison: 03/06/2015

CLINICAL DATA: Chest pain and shortness of breath with
lightheadedness for 2 days. Nonsmoker.

EXAM:
PORTABLE CHEST - 1 VIEW

[chest ap]
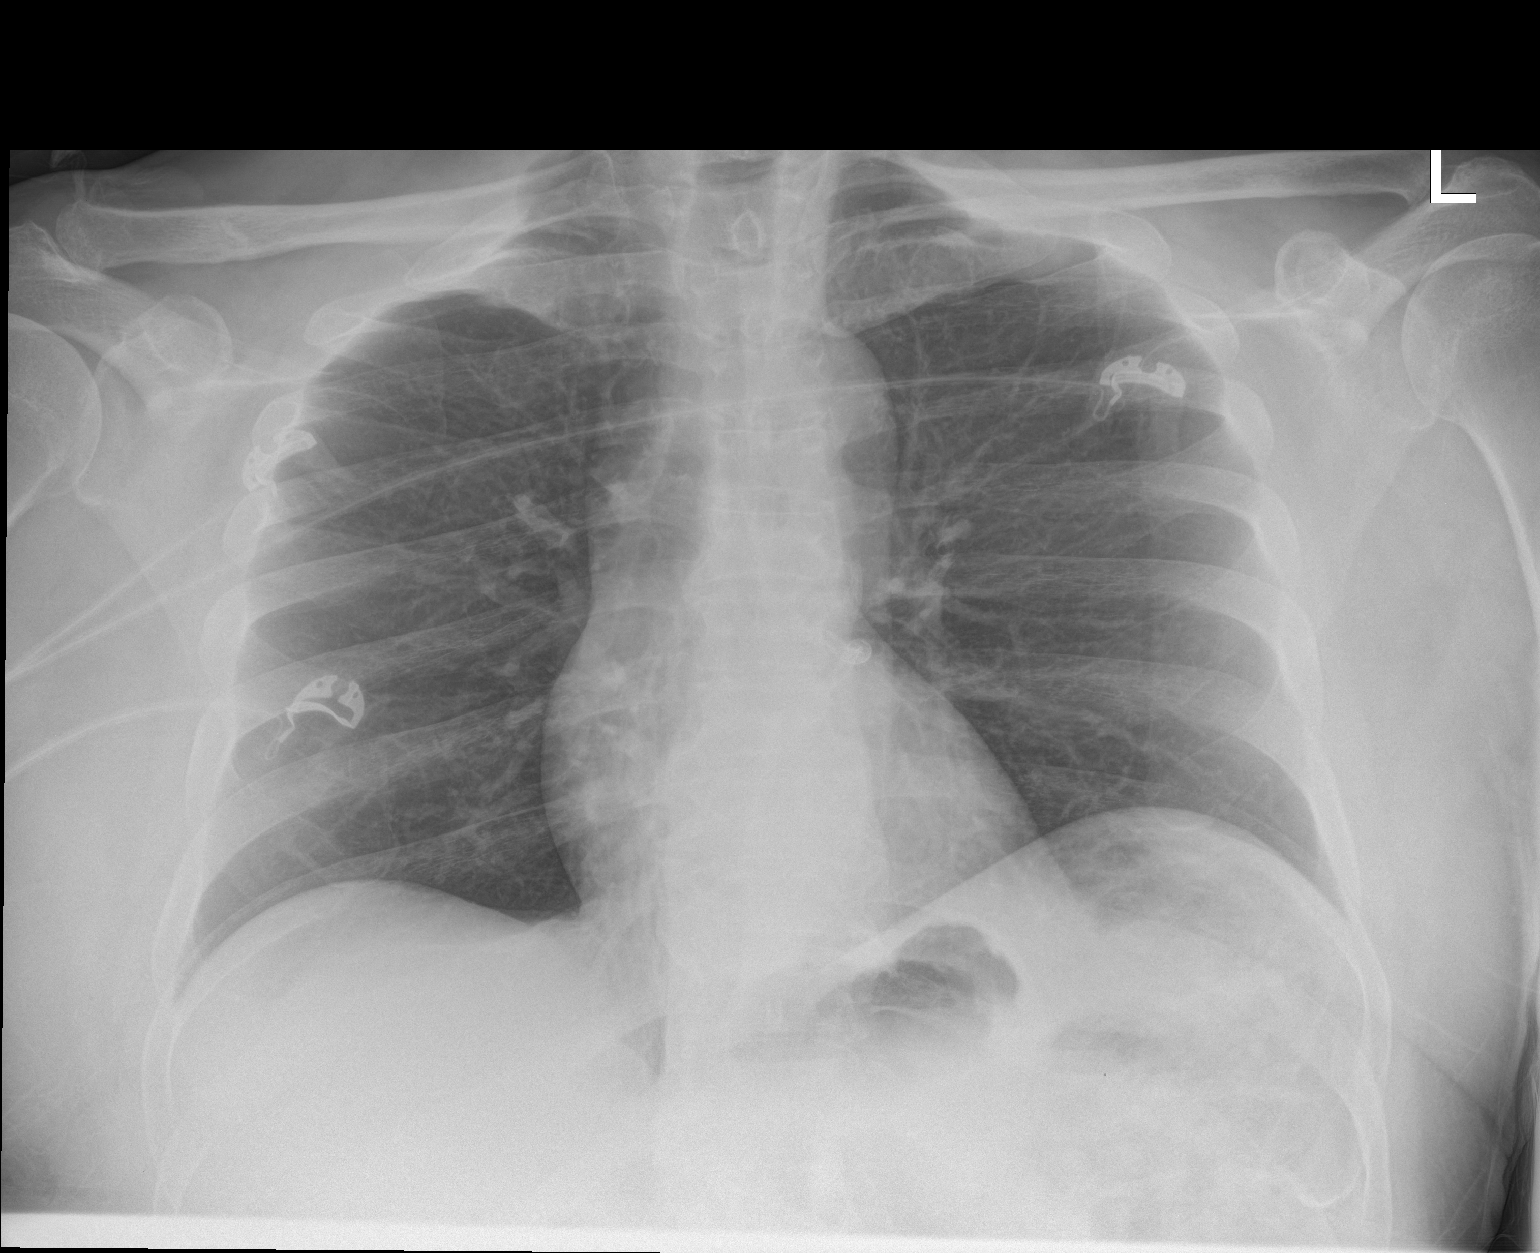

[1 of 1 positions shown; findings below may reference images not displayed]

FINDINGS: Shallow inspiration with elevation of the left hemidiaphragm. Normal
heart size and pulmonary vascularity. No focal airspace disease or
consolidation in the lungs. No blunting of costophrenic angles. No
pneumothorax. Mediastinal contours appear intact.
IMPRESSION: No active disease.

## 2016-06-11 IMAGING — CR DG CHEST 2V
2 series · 2 of 2 positions shown · non-contrast
Comparison: April 19, 2015

CLINICAL DATA: Shortness of breath and wheezing for 3 weeks.

EXAM:
CHEST  2 VIEW

[w chest pa]
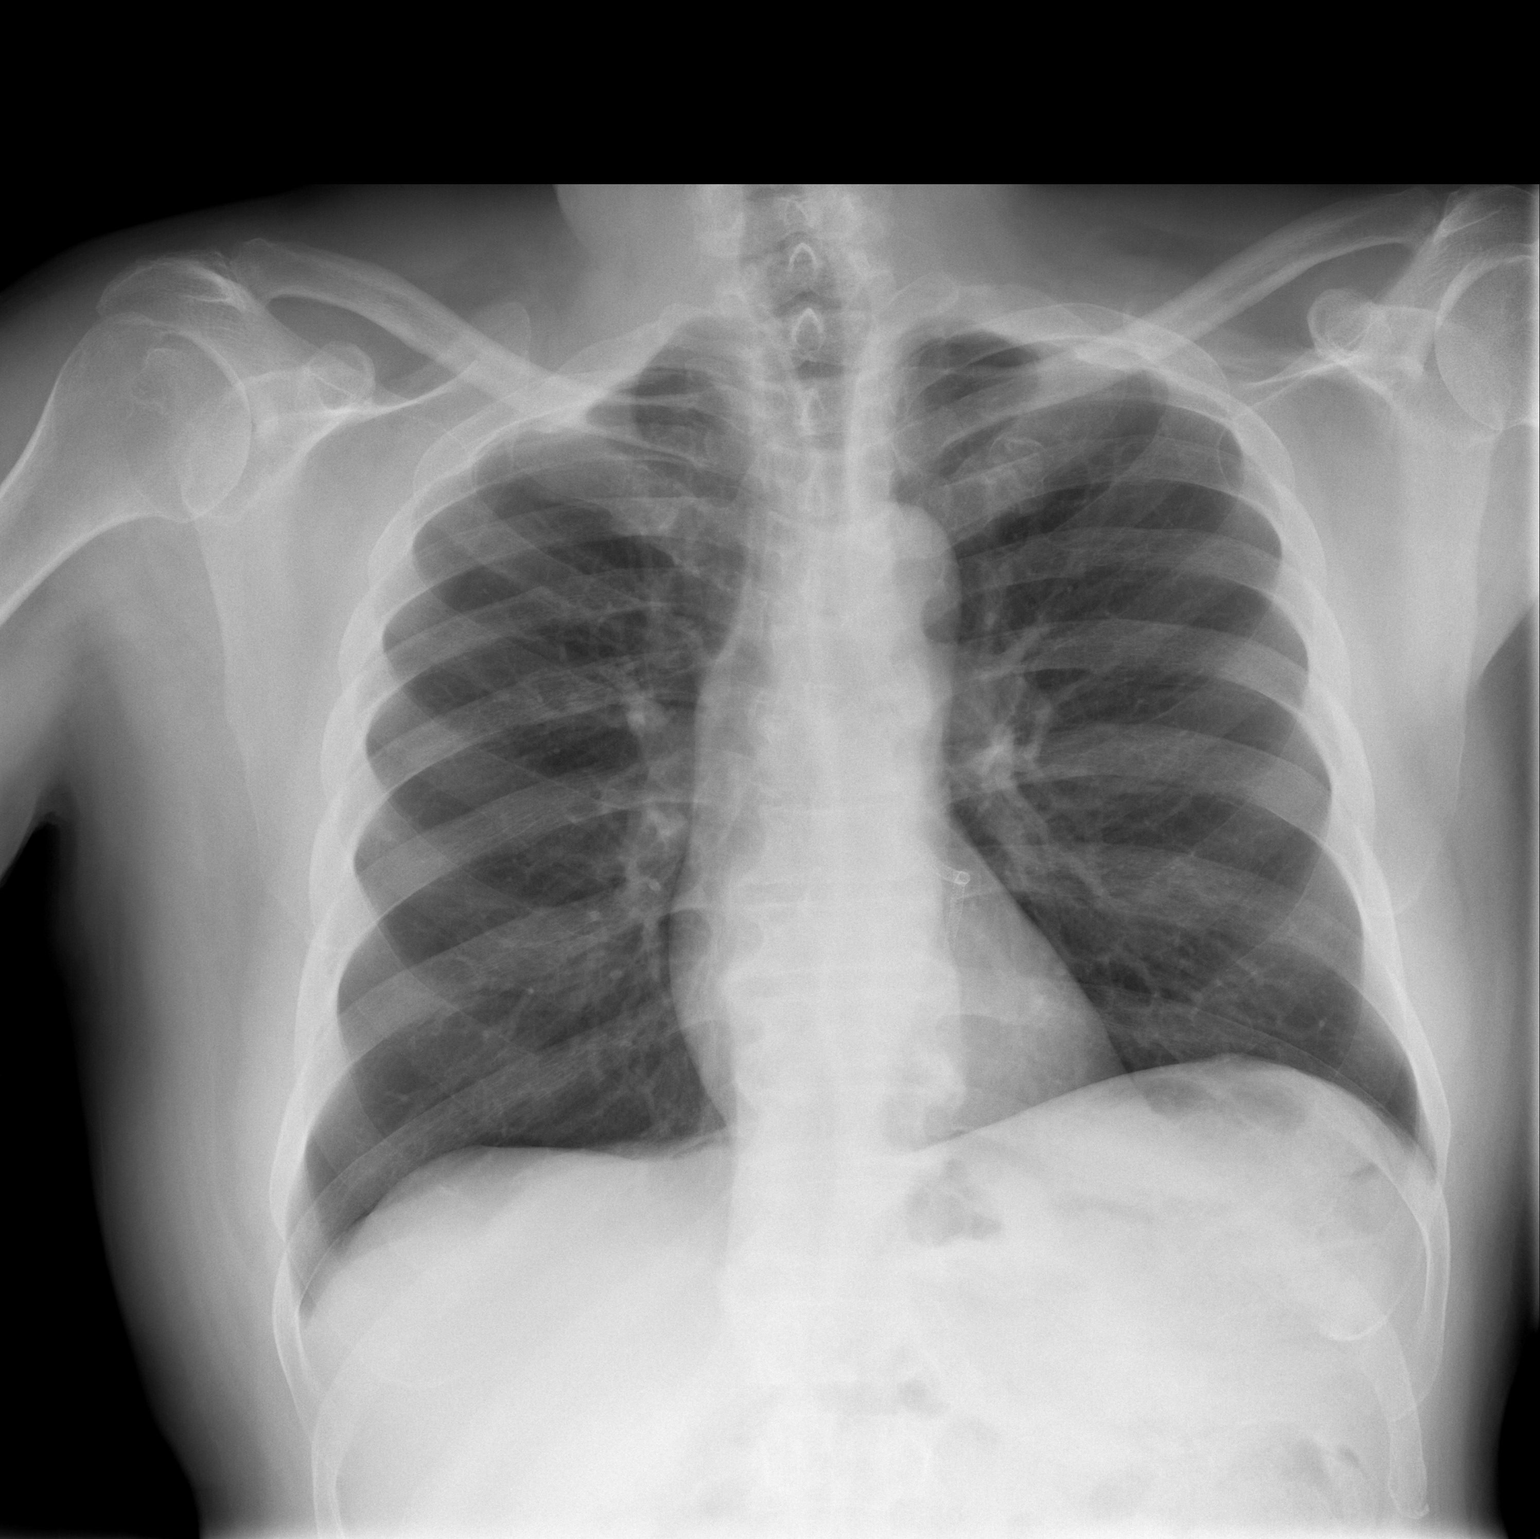

[w chest lat]
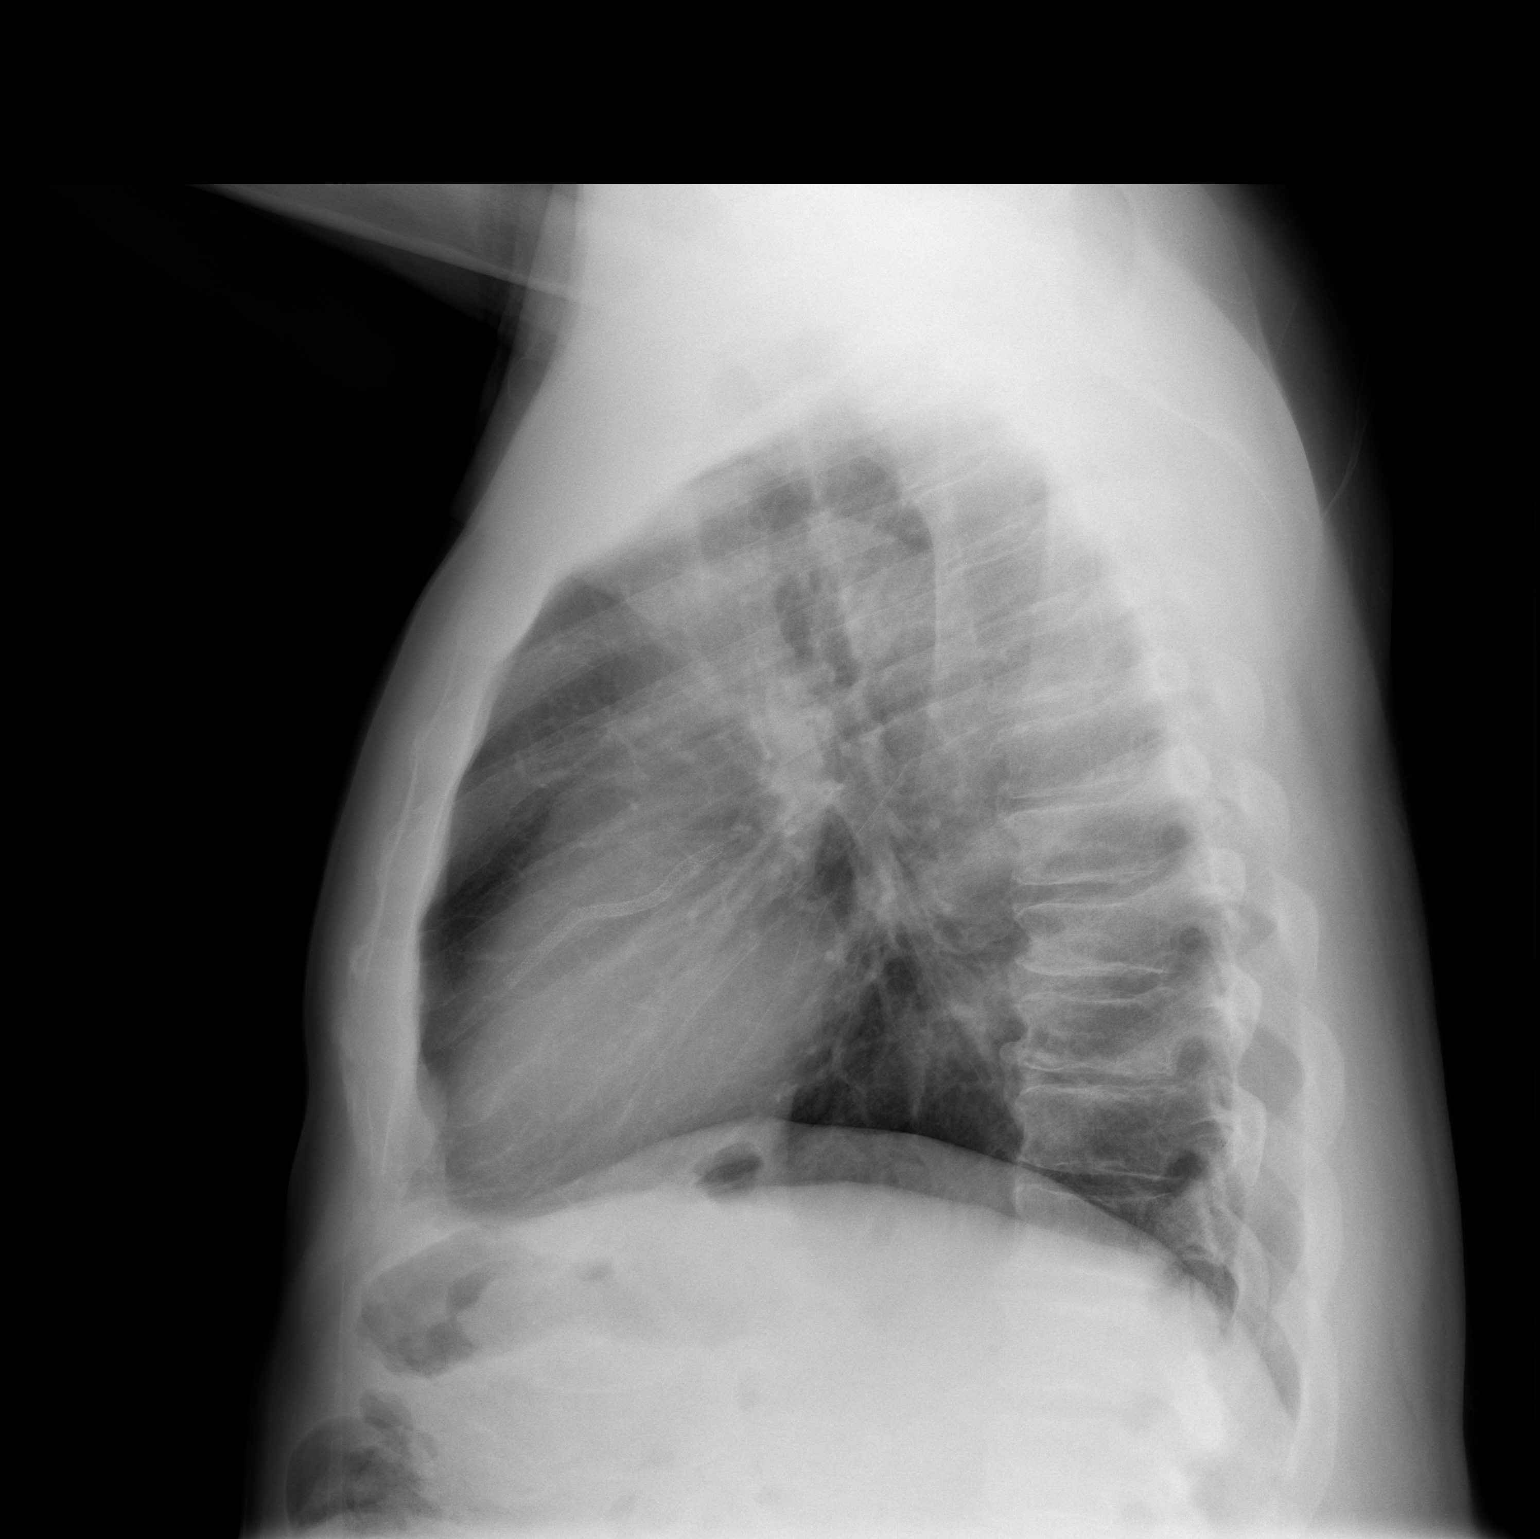

[2 of 2 positions shown; findings below may reference images not displayed]

FINDINGS: Lungs are clear. The heart size and pulmonary vascularity are
normal. No adenopathy. There is a stent in the left anterior
descending coronary artery. There is mild degenerative change in the
thoracic spine.
IMPRESSION: No edema or consolidation.

## 2016-06-14 ENCOUNTER — Other Ambulatory Visit: Payer: Self-pay | Admitting: Dietician

## 2016-06-14 ENCOUNTER — Encounter: Payer: Self-pay | Admitting: Dietician

## 2016-06-14 DIAGNOSIS — E1165 Type 2 diabetes mellitus with hyperglycemia: Principal | ICD-10-CM

## 2016-06-14 DIAGNOSIS — IMO0002 Reserved for concepts with insufficient information to code with codable children: Secondary | ICD-10-CM

## 2016-06-14 DIAGNOSIS — E1140 Type 2 diabetes mellitus with diabetic neuropathy, unspecified: Secondary | ICD-10-CM

## 2016-06-19 ENCOUNTER — Encounter: Payer: Self-pay | Admitting: Dietician

## 2016-06-19 DIAGNOSIS — I10 Essential (primary) hypertension: Secondary | ICD-10-CM | POA: Diagnosis not present

## 2016-06-19 DIAGNOSIS — J449 Chronic obstructive pulmonary disease, unspecified: Secondary | ICD-10-CM | POA: Diagnosis not present

## 2016-06-19 DIAGNOSIS — R631 Polydipsia: Secondary | ICD-10-CM | POA: Diagnosis not present

## 2016-06-19 DIAGNOSIS — Z794 Long term (current) use of insulin: Secondary | ICD-10-CM | POA: Diagnosis not present

## 2016-06-19 DIAGNOSIS — I2510 Atherosclerotic heart disease of native coronary artery without angina pectoris: Secondary | ICD-10-CM | POA: Diagnosis not present

## 2016-06-19 DIAGNOSIS — E1165 Type 2 diabetes mellitus with hyperglycemia: Secondary | ICD-10-CM | POA: Diagnosis not present

## 2016-06-19 DIAGNOSIS — R0789 Other chest pain: Secondary | ICD-10-CM | POA: Diagnosis not present

## 2016-06-19 DIAGNOSIS — R079 Chest pain, unspecified: Secondary | ICD-10-CM | POA: Diagnosis not present

## 2016-06-19 DIAGNOSIS — R404 Transient alteration of awareness: Secondary | ICD-10-CM | POA: Diagnosis not present

## 2016-06-19 DIAGNOSIS — R0602 Shortness of breath: Secondary | ICD-10-CM | POA: Diagnosis not present

## 2016-06-19 DIAGNOSIS — R42 Dizziness and giddiness: Secondary | ICD-10-CM | POA: Diagnosis not present

## 2016-06-19 DIAGNOSIS — R200 Anesthesia of skin: Secondary | ICD-10-CM | POA: Diagnosis not present

## 2016-06-20 DIAGNOSIS — E1165 Type 2 diabetes mellitus with hyperglycemia: Secondary | ICD-10-CM | POA: Diagnosis not present

## 2016-06-20 DIAGNOSIS — R0602 Shortness of breath: Secondary | ICD-10-CM | POA: Diagnosis not present

## 2016-06-20 DIAGNOSIS — R079 Chest pain, unspecified: Secondary | ICD-10-CM | POA: Diagnosis not present

## 2016-07-11 DIAGNOSIS — R7989 Other specified abnormal findings of blood chemistry: Secondary | ICD-10-CM | POA: Diagnosis not present

## 2016-07-11 DIAGNOSIS — E1165 Type 2 diabetes mellitus with hyperglycemia: Secondary | ICD-10-CM | POA: Diagnosis not present

## 2016-07-11 DIAGNOSIS — Z794 Long term (current) use of insulin: Secondary | ICD-10-CM | POA: Diagnosis not present

## 2016-07-11 DIAGNOSIS — R079 Chest pain, unspecified: Secondary | ICD-10-CM | POA: Diagnosis not present

## 2016-07-11 DIAGNOSIS — I10 Essential (primary) hypertension: Secondary | ICD-10-CM | POA: Diagnosis not present

## 2016-07-11 DIAGNOSIS — E11618 Type 2 diabetes mellitus with other diabetic arthropathy: Secondary | ICD-10-CM | POA: Diagnosis not present

## 2016-07-11 DIAGNOSIS — G8929 Other chronic pain: Secondary | ICD-10-CM | POA: Diagnosis not present

## 2016-07-11 DIAGNOSIS — M109 Gout, unspecified: Secondary | ICD-10-CM | POA: Diagnosis not present

## 2016-07-11 DIAGNOSIS — Z79899 Other long term (current) drug therapy: Secondary | ICD-10-CM | POA: Diagnosis not present

## 2016-07-11 DIAGNOSIS — I2510 Atherosclerotic heart disease of native coronary artery without angina pectoris: Secondary | ICD-10-CM | POA: Diagnosis not present

## 2016-07-11 DIAGNOSIS — Z7982 Long term (current) use of aspirin: Secondary | ICD-10-CM | POA: Diagnosis not present

## 2016-07-11 DIAGNOSIS — R739 Hyperglycemia, unspecified: Secondary | ICD-10-CM | POA: Diagnosis not present

## 2016-07-11 DIAGNOSIS — E119 Type 2 diabetes mellitus without complications: Secondary | ICD-10-CM | POA: Diagnosis not present

## 2016-07-12 DIAGNOSIS — M542 Cervicalgia: Secondary | ICD-10-CM | POA: Diagnosis not present

## 2016-07-12 DIAGNOSIS — E1165 Type 2 diabetes mellitus with hyperglycemia: Secondary | ICD-10-CM | POA: Diagnosis not present

## 2016-07-12 DIAGNOSIS — Z794 Long term (current) use of insulin: Secondary | ICD-10-CM | POA: Diagnosis not present

## 2016-07-12 DIAGNOSIS — R739 Hyperglycemia, unspecified: Secondary | ICD-10-CM | POA: Diagnosis not present

## 2016-07-12 DIAGNOSIS — E118 Type 2 diabetes mellitus with unspecified complications: Secondary | ICD-10-CM | POA: Diagnosis not present

## 2016-07-13 DIAGNOSIS — E118 Type 2 diabetes mellitus with unspecified complications: Secondary | ICD-10-CM | POA: Diagnosis not present

## 2016-07-13 DIAGNOSIS — E1165 Type 2 diabetes mellitus with hyperglycemia: Secondary | ICD-10-CM | POA: Diagnosis not present

## 2016-07-13 DIAGNOSIS — Z794 Long term (current) use of insulin: Secondary | ICD-10-CM | POA: Diagnosis not present

## 2016-07-13 DIAGNOSIS — R739 Hyperglycemia, unspecified: Secondary | ICD-10-CM | POA: Diagnosis not present

## 2016-07-14 DIAGNOSIS — G9589 Other specified diseases of spinal cord: Secondary | ICD-10-CM | POA: Diagnosis not present

## 2016-07-14 DIAGNOSIS — Z794 Long term (current) use of insulin: Secondary | ICD-10-CM | POA: Diagnosis not present

## 2016-07-14 DIAGNOSIS — R739 Hyperglycemia, unspecified: Secondary | ICD-10-CM | POA: Diagnosis not present

## 2016-07-14 DIAGNOSIS — E1165 Type 2 diabetes mellitus with hyperglycemia: Secondary | ICD-10-CM | POA: Diagnosis not present

## 2016-07-14 DIAGNOSIS — M5032 Other cervical disc degeneration, mid-cervical region, unspecified level: Secondary | ICD-10-CM | POA: Diagnosis not present

## 2016-07-14 DIAGNOSIS — E118 Type 2 diabetes mellitus with unspecified complications: Secondary | ICD-10-CM | POA: Diagnosis not present

## 2016-07-15 DIAGNOSIS — G8929 Other chronic pain: Secondary | ICD-10-CM | POA: Diagnosis not present

## 2016-07-15 DIAGNOSIS — E118 Type 2 diabetes mellitus with unspecified complications: Secondary | ICD-10-CM | POA: Diagnosis not present

## 2016-07-15 DIAGNOSIS — Z794 Long term (current) use of insulin: Secondary | ICD-10-CM | POA: Diagnosis not present

## 2016-07-15 DIAGNOSIS — E1165 Type 2 diabetes mellitus with hyperglycemia: Secondary | ICD-10-CM | POA: Diagnosis not present

## 2016-07-16 DIAGNOSIS — Z794 Long term (current) use of insulin: Secondary | ICD-10-CM | POA: Diagnosis not present

## 2016-07-16 DIAGNOSIS — G8929 Other chronic pain: Secondary | ICD-10-CM | POA: Diagnosis not present

## 2016-07-16 DIAGNOSIS — E1165 Type 2 diabetes mellitus with hyperglycemia: Secondary | ICD-10-CM | POA: Diagnosis not present

## 2016-07-16 DIAGNOSIS — K5900 Constipation, unspecified: Secondary | ICD-10-CM | POA: Diagnosis not present

## 2016-07-17 DIAGNOSIS — Z794 Long term (current) use of insulin: Secondary | ICD-10-CM | POA: Diagnosis not present

## 2016-07-17 DIAGNOSIS — R739 Hyperglycemia, unspecified: Secondary | ICD-10-CM | POA: Diagnosis not present

## 2016-07-17 DIAGNOSIS — E1165 Type 2 diabetes mellitus with hyperglycemia: Secondary | ICD-10-CM | POA: Diagnosis not present

## 2016-07-17 DIAGNOSIS — K5900 Constipation, unspecified: Secondary | ICD-10-CM | POA: Diagnosis not present

## 2016-07-17 DIAGNOSIS — E1122 Type 2 diabetes mellitus with diabetic chronic kidney disease: Secondary | ICD-10-CM | POA: Diagnosis not present

## 2016-07-20 DIAGNOSIS — E1140 Type 2 diabetes mellitus with diabetic neuropathy, unspecified: Secondary | ICD-10-CM | POA: Diagnosis not present

## 2016-07-20 DIAGNOSIS — E1165 Type 2 diabetes mellitus with hyperglycemia: Secondary | ICD-10-CM | POA: Diagnosis not present

## 2016-08-06 ENCOUNTER — Ambulatory Visit: Payer: Self-pay

## 2016-08-26 IMAGING — DX DG CHEST 2V
2 series · 2 of 2 positions shown · non-contrast
Comparison: Chest x-ray of 05/03/2015

CLINICAL DATA: Cough for 2 weeks

EXAM:
CHEST  2 VIEW

[chest pa]
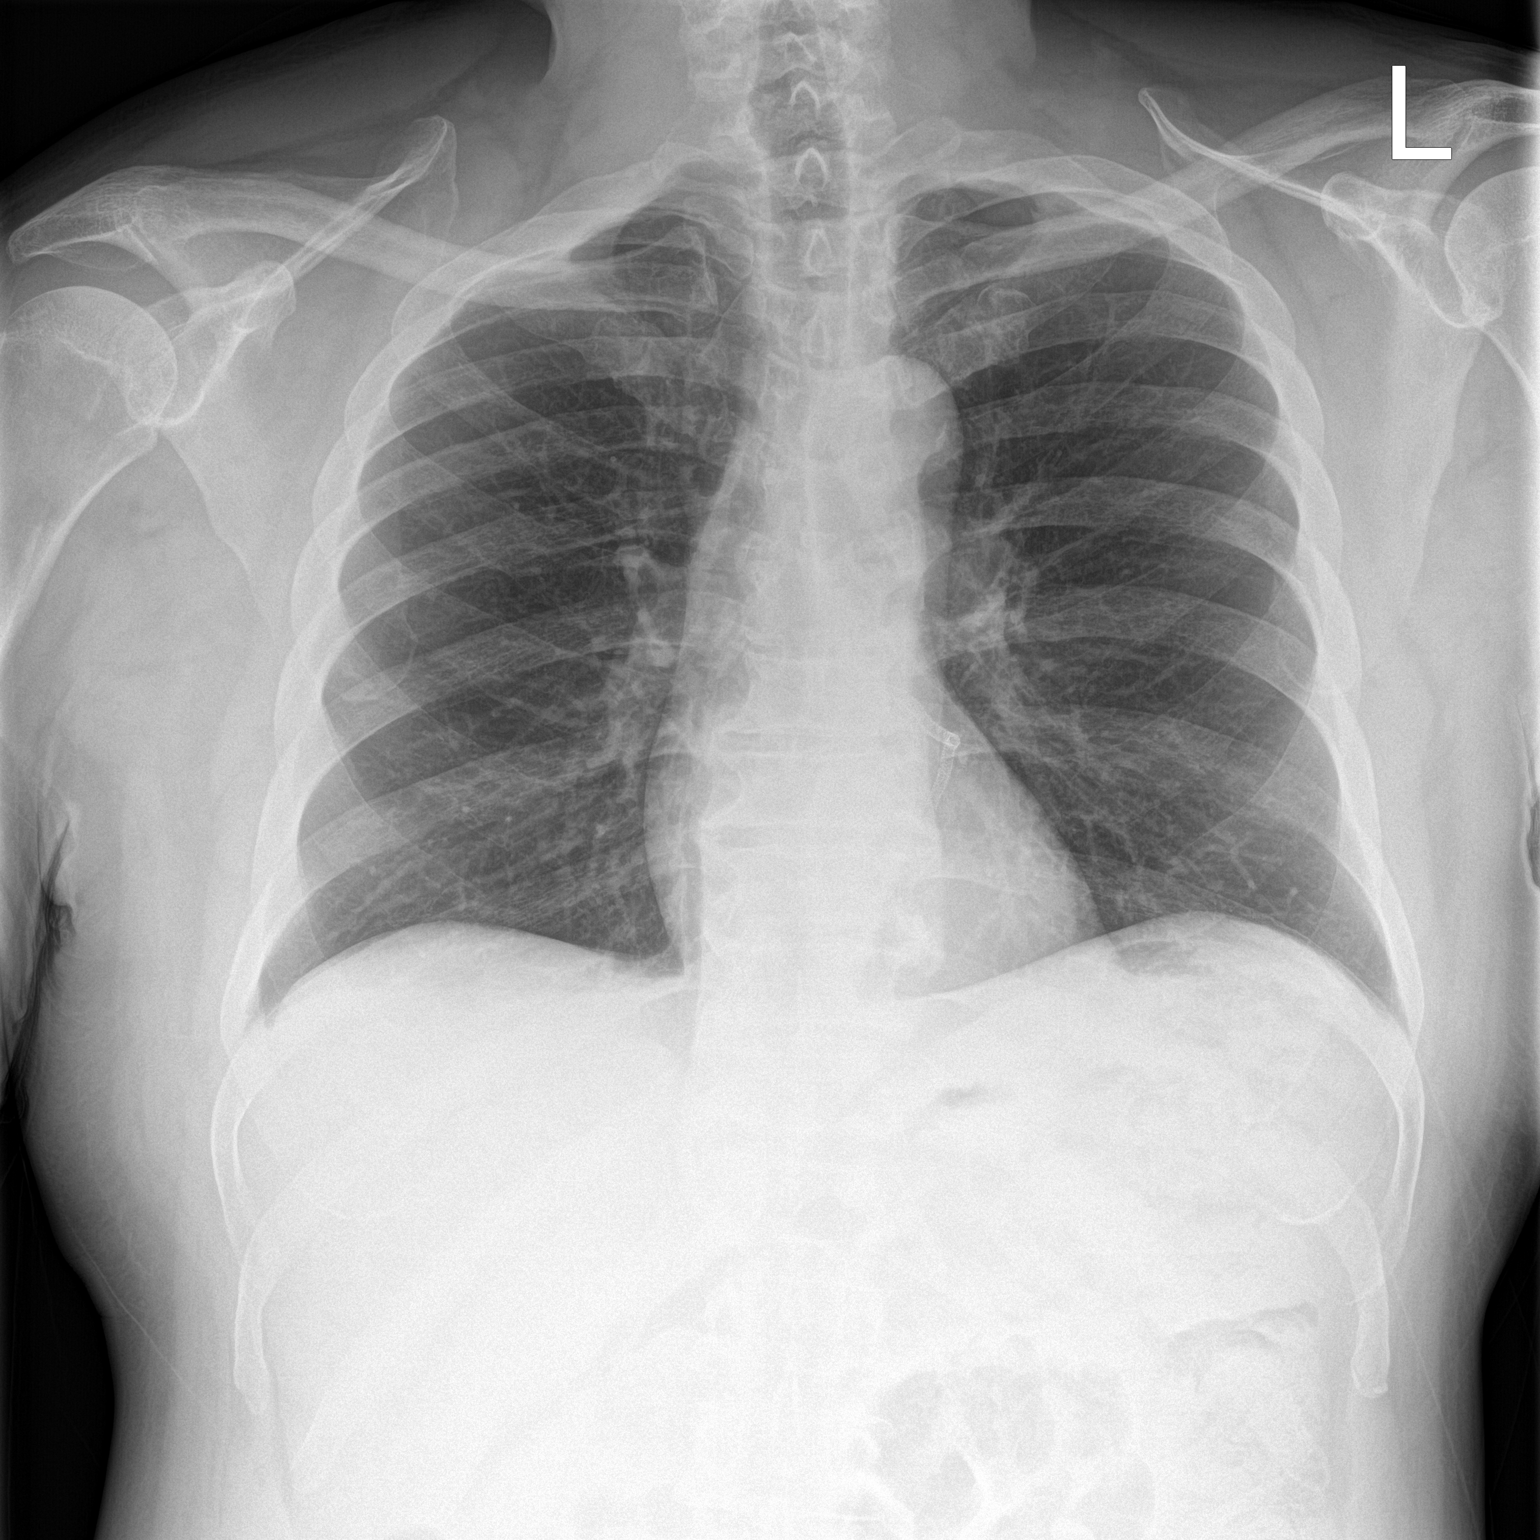

[chest lat]
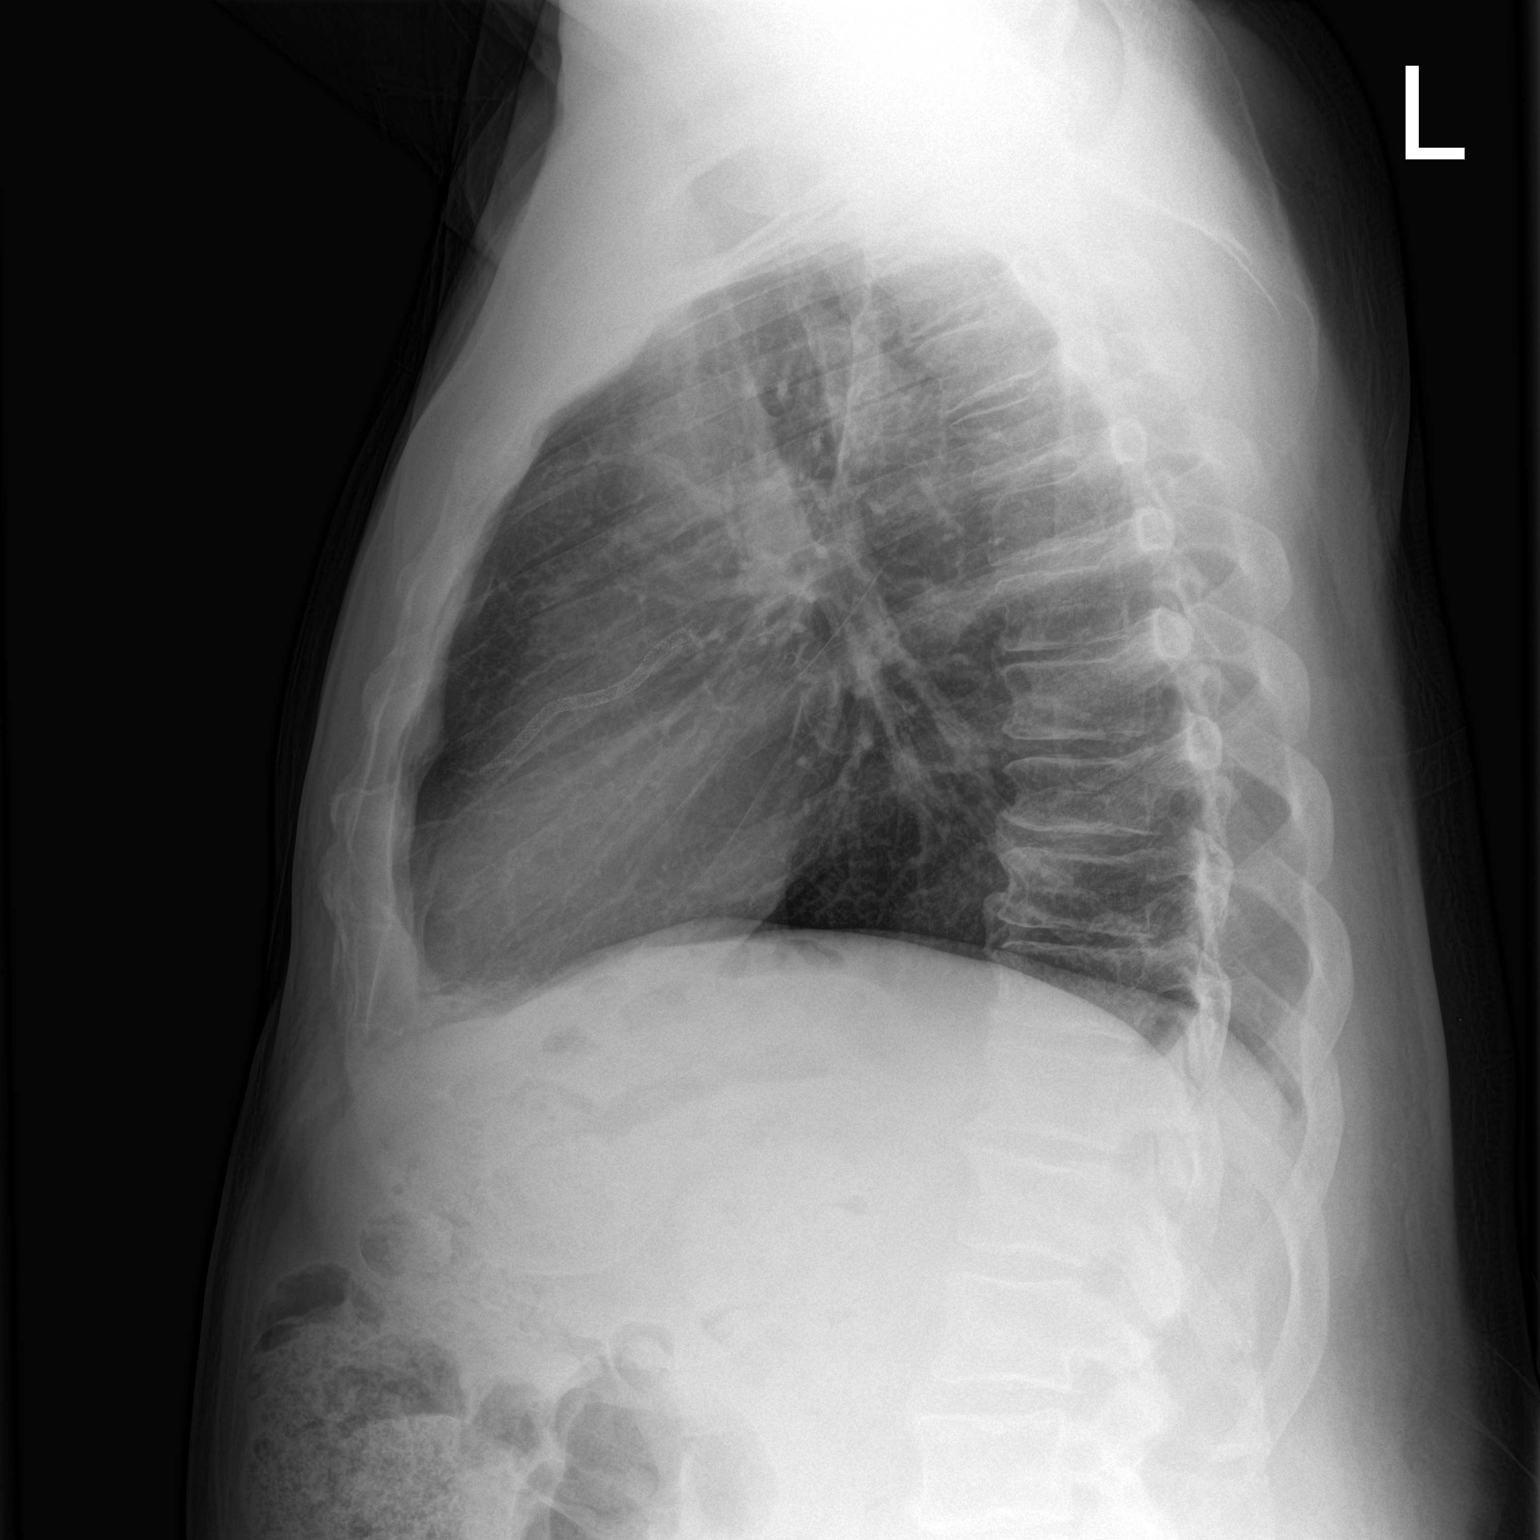

[2 of 2 positions shown; findings below may reference images not displayed]

FINDINGS: No active infiltrate or effusion is seen. There is mild
peribronchial thickening which may indicate bronchitis. The heart is
within normal limits in size. Coronary artery stents are noted. No
bony abnormality is seen.
IMPRESSION: No active lung disease. Mild peribronchial thickening may indicate
bronchitis.

## 2016-09-16 ENCOUNTER — Other Ambulatory Visit: Payer: Self-pay | Admitting: Internal Medicine

## 2016-09-16 DIAGNOSIS — IMO0002 Reserved for concepts with insufficient information to code with codable children: Secondary | ICD-10-CM

## 2016-09-16 DIAGNOSIS — E1165 Type 2 diabetes mellitus with hyperglycemia: Principal | ICD-10-CM

## 2016-09-16 DIAGNOSIS — E1140 Type 2 diabetes mellitus with diabetic neuropathy, unspecified: Secondary | ICD-10-CM

## 2016-09-18 ENCOUNTER — Encounter: Payer: Self-pay | Admitting: Internal Medicine

## 2016-09-21 IMAGING — CT CT HEAD W/O CM
2 series · 17 of 30 positions shown, 20 images · non-contrast
Comparison: Previous examinations, including the brain MR dated
12/15/2014 and head CT dated 04/25/2014.

CLINICAL DATA: Diffuse myalgias. Audible hallucinations. Used crack
this morning.

EXAM:
CT HEAD WITHOUT CONTRAST
TECHNIQUE: Contiguous axial images were obtained from the base of the skull
through the vertex without intravenous contrast.

[Series 2: head w/o · axial · non-contrast · 0.45mm/px · z∈[-115,+10]mm · 9 of 33 slices shown, 12 images]
[im 4/33  brain]
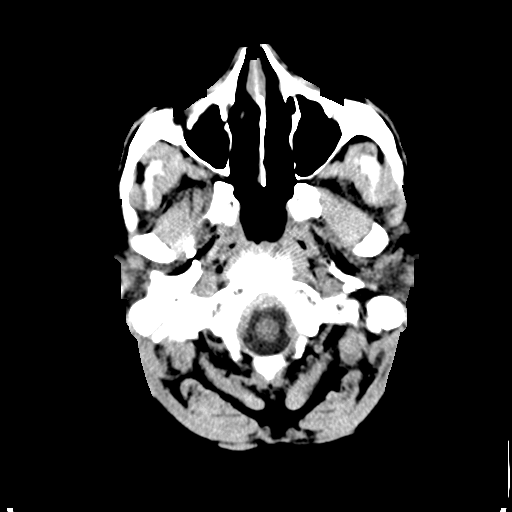
[im 4/33  bone]
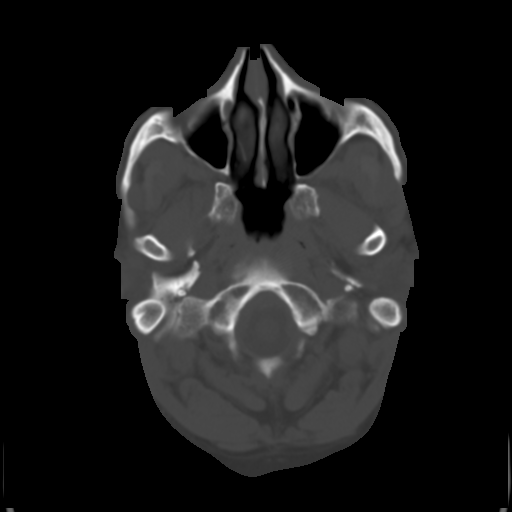
[im 7/33  brain]
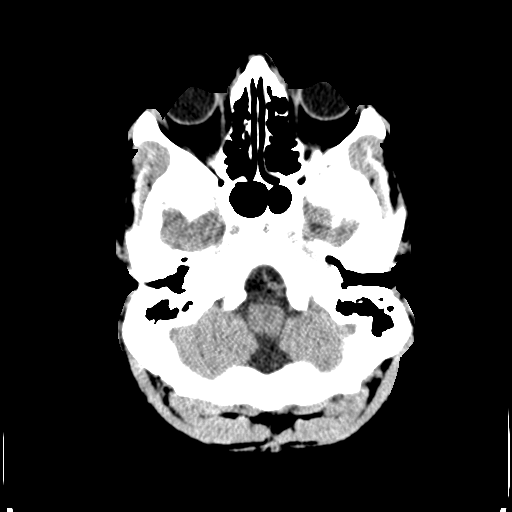
[im 10/33  brain]
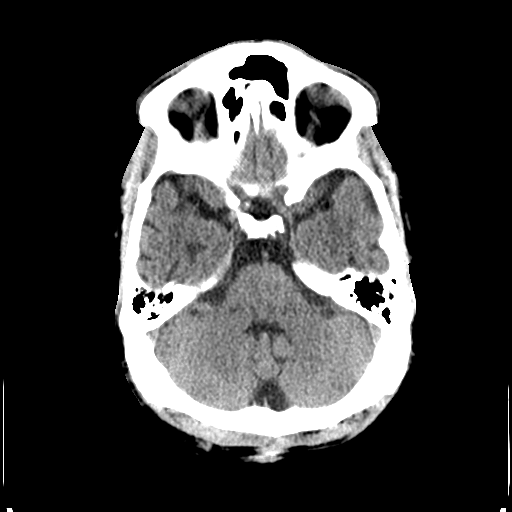
[im 13/33  brain]
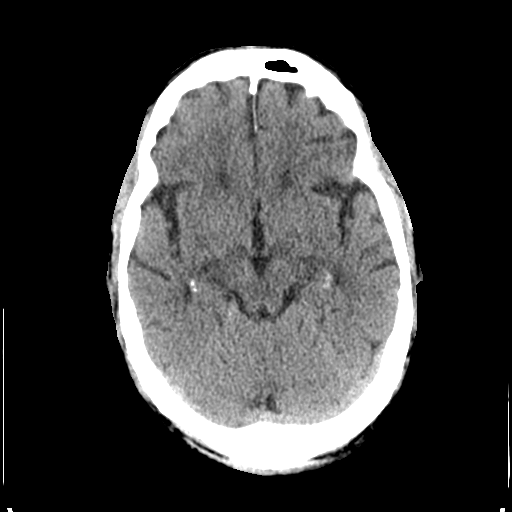
[im 17/33  brain]
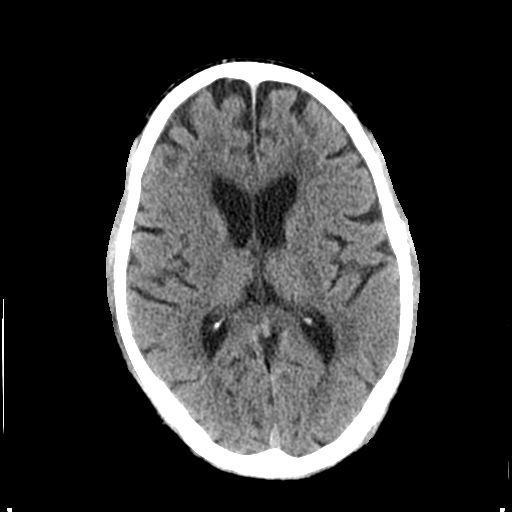
[im 17/33  bone]
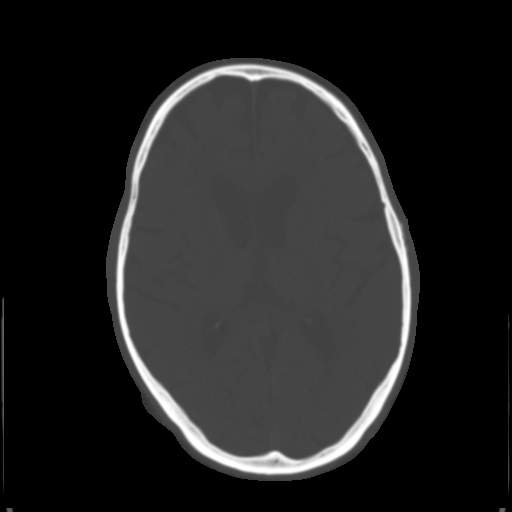
[im 20/33  brain]
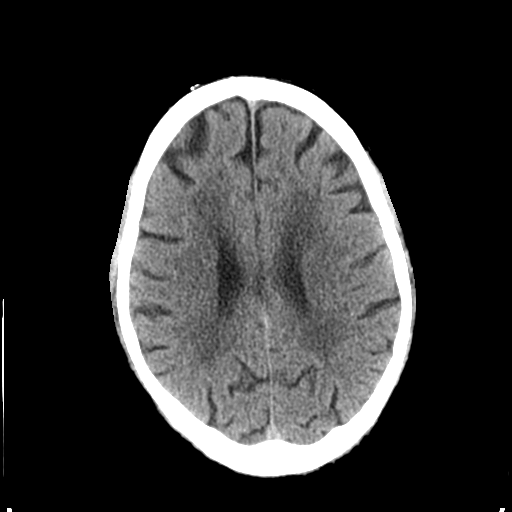
[im 23/33  brain]
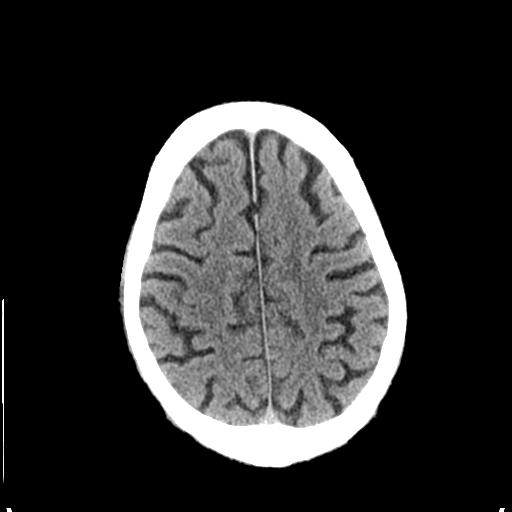
[im 26/33  brain]
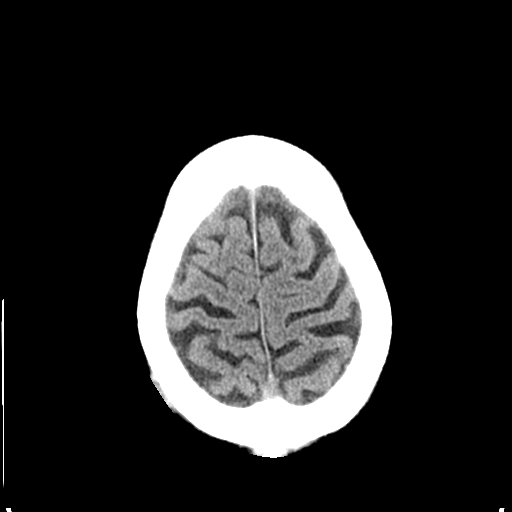
[im 29/33  brain]
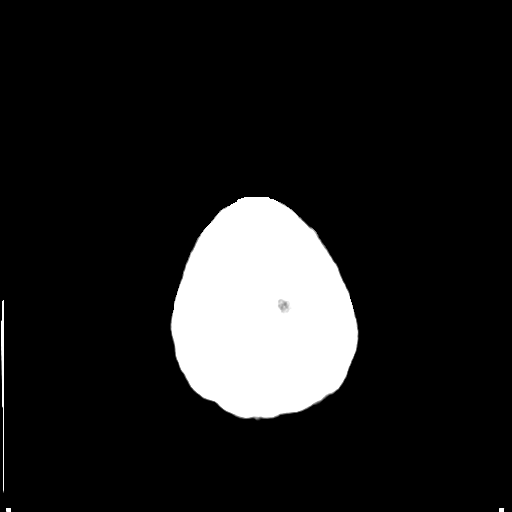
[im 29/33  bone]
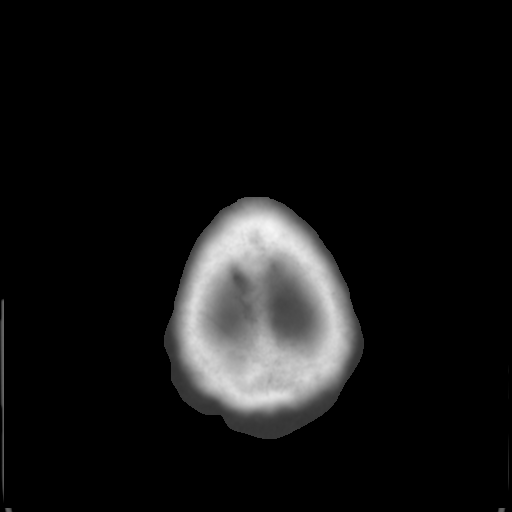

[Series 3: bone windows · axial · 0.45mm/px · z∈[-112,+14]mm · 8 of 55 slices shown]
[im 7/55  bone]
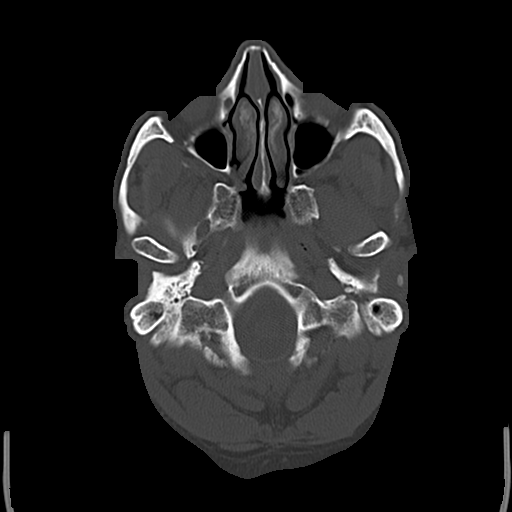
[im 13/55  bone]
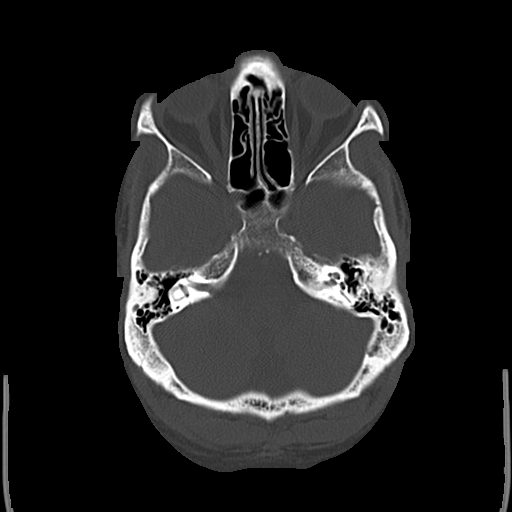
[im 19/55  bone]
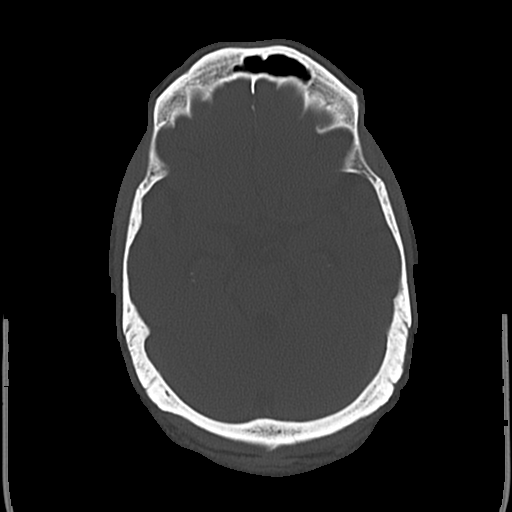
[im 25/55  bone]
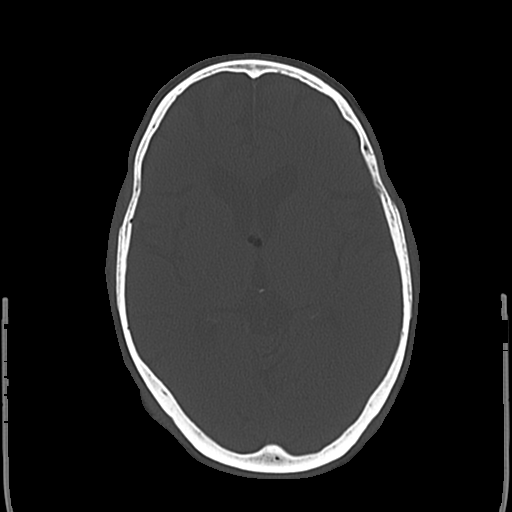
[im 31/55  bone]
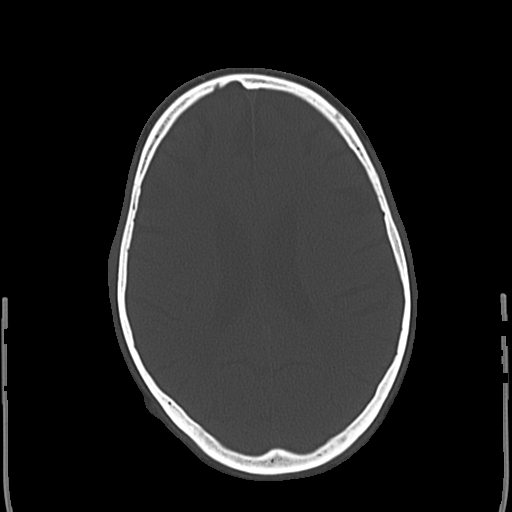
[im 37/55  bone]
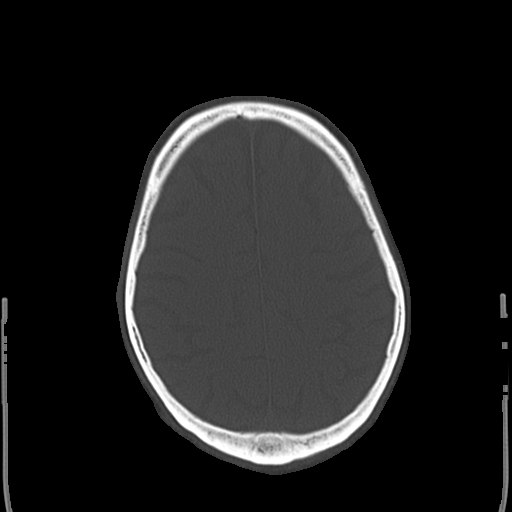
[im 43/55  bone]
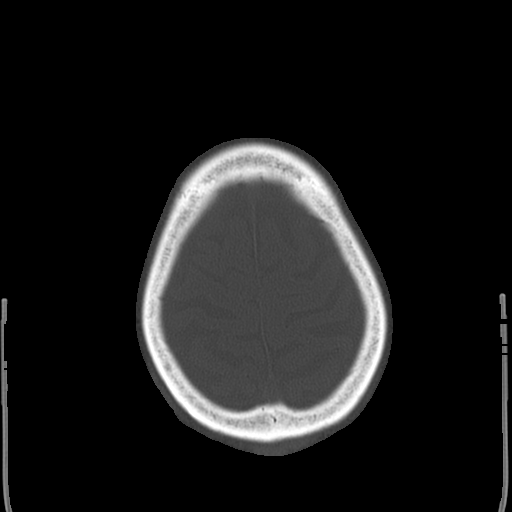
[im 49/55  bone]
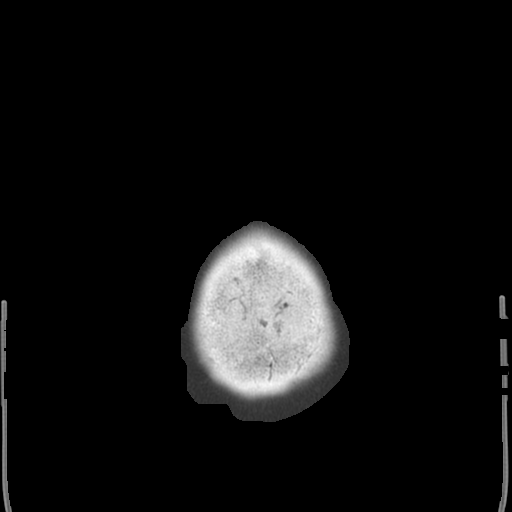

[17 of 30 positions shown; findings below may reference images not displayed]

FINDINGS: The previously noted 7 mm lipoma associated with the inferior aspect
of the septum pellucidum near the foramen of Brigitta is unchanged.
Diffusely enlarged ventricles and subarachnoid spaces. Patchy white
matter low density in both cerebral hemispheres. No intracranial
hemorrhage, mass lesion or CT evidence of acute infarction.
Unremarkable bones and included paranasal sinuses.
IMPRESSION: 1. No acute abnormality.
2. Stable atrophy and chronic small vessel white matter ischemic
changes.

## 2016-10-11 IMAGING — DX DG CHEST 2V
2 series · 2 of 2 positions shown · non-contrast
Comparison: 08/13/2015

CLINICAL DATA: Cough and left-sided chest pain 4 days.

EXAM:
CHEST  2 VIEW

[w chest pa]
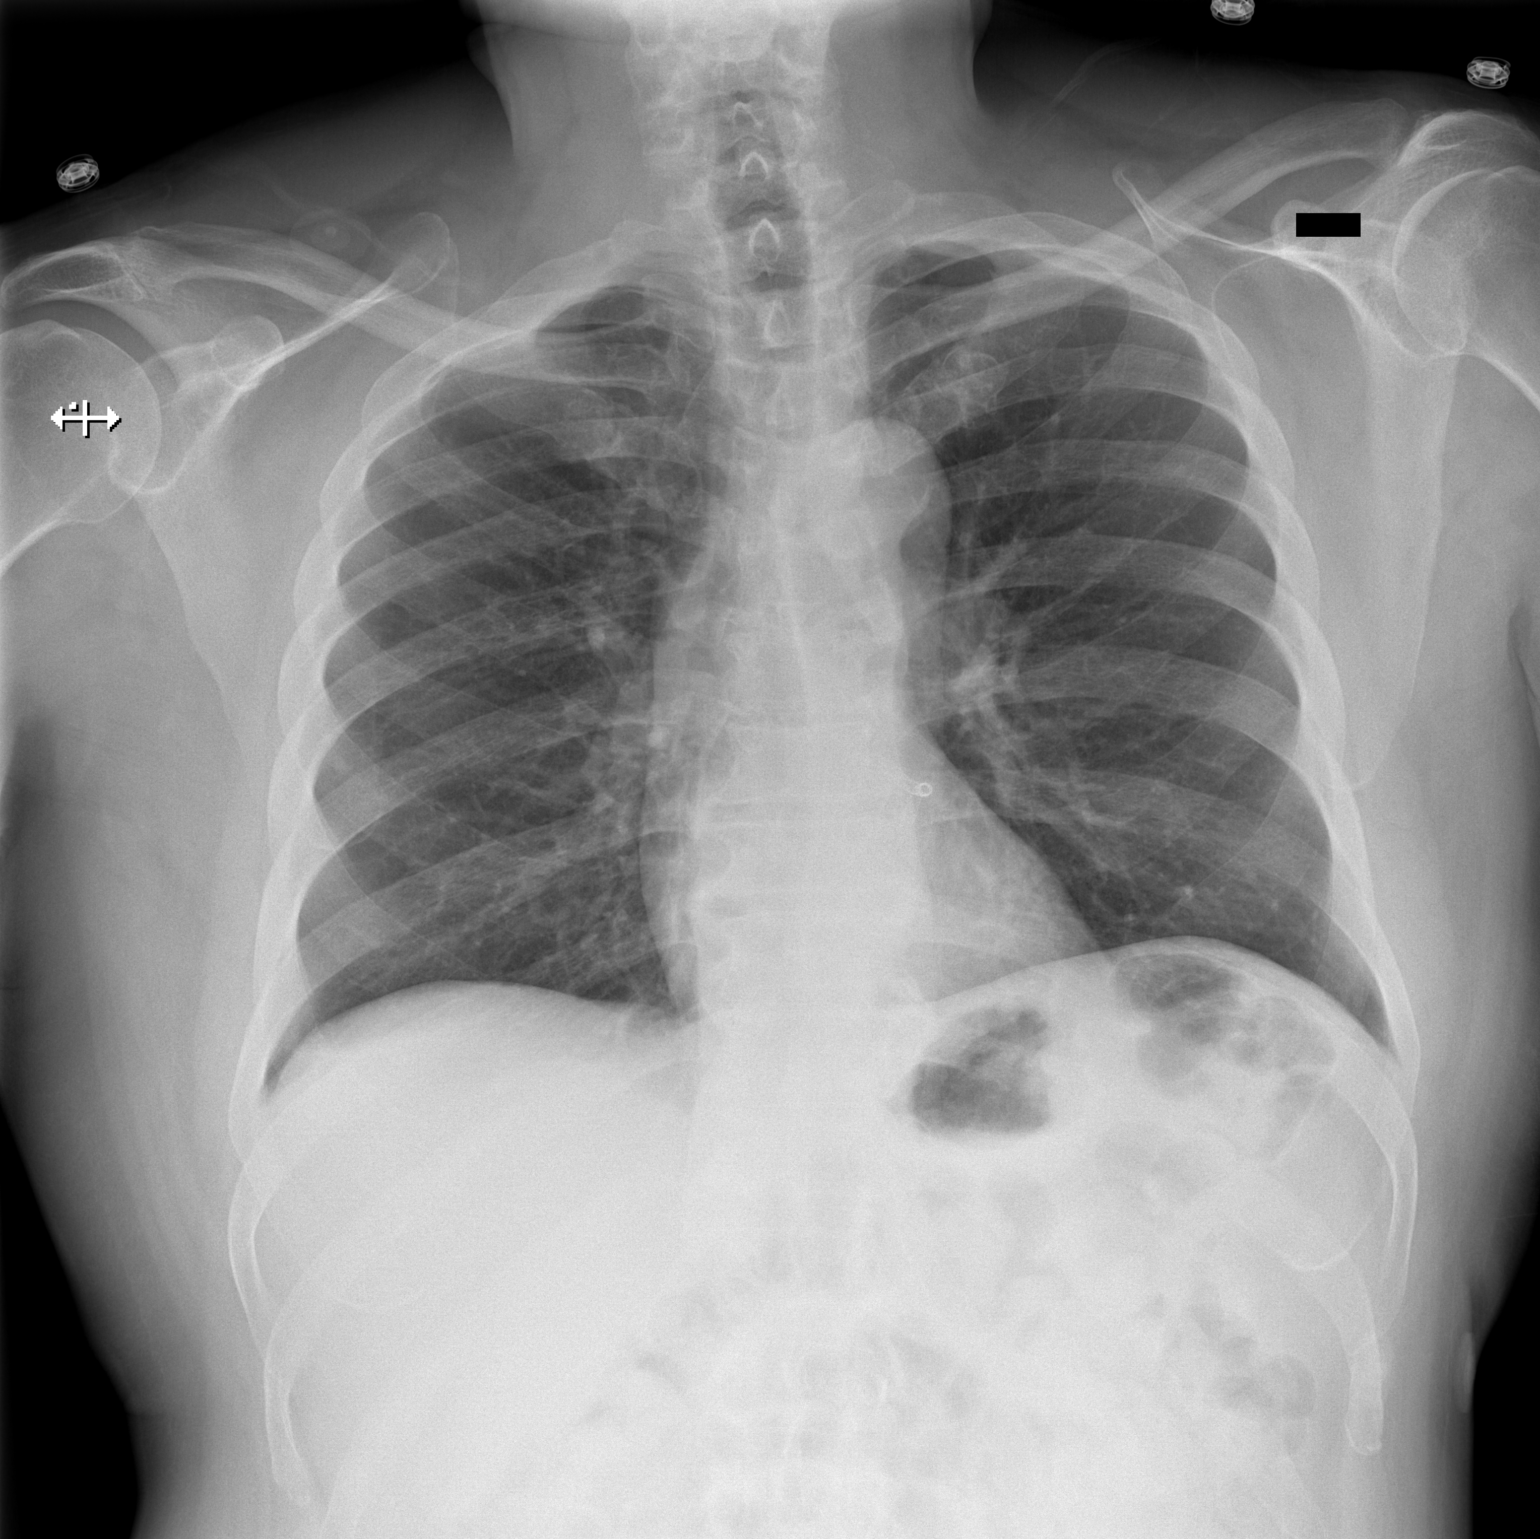

[w chest lat]
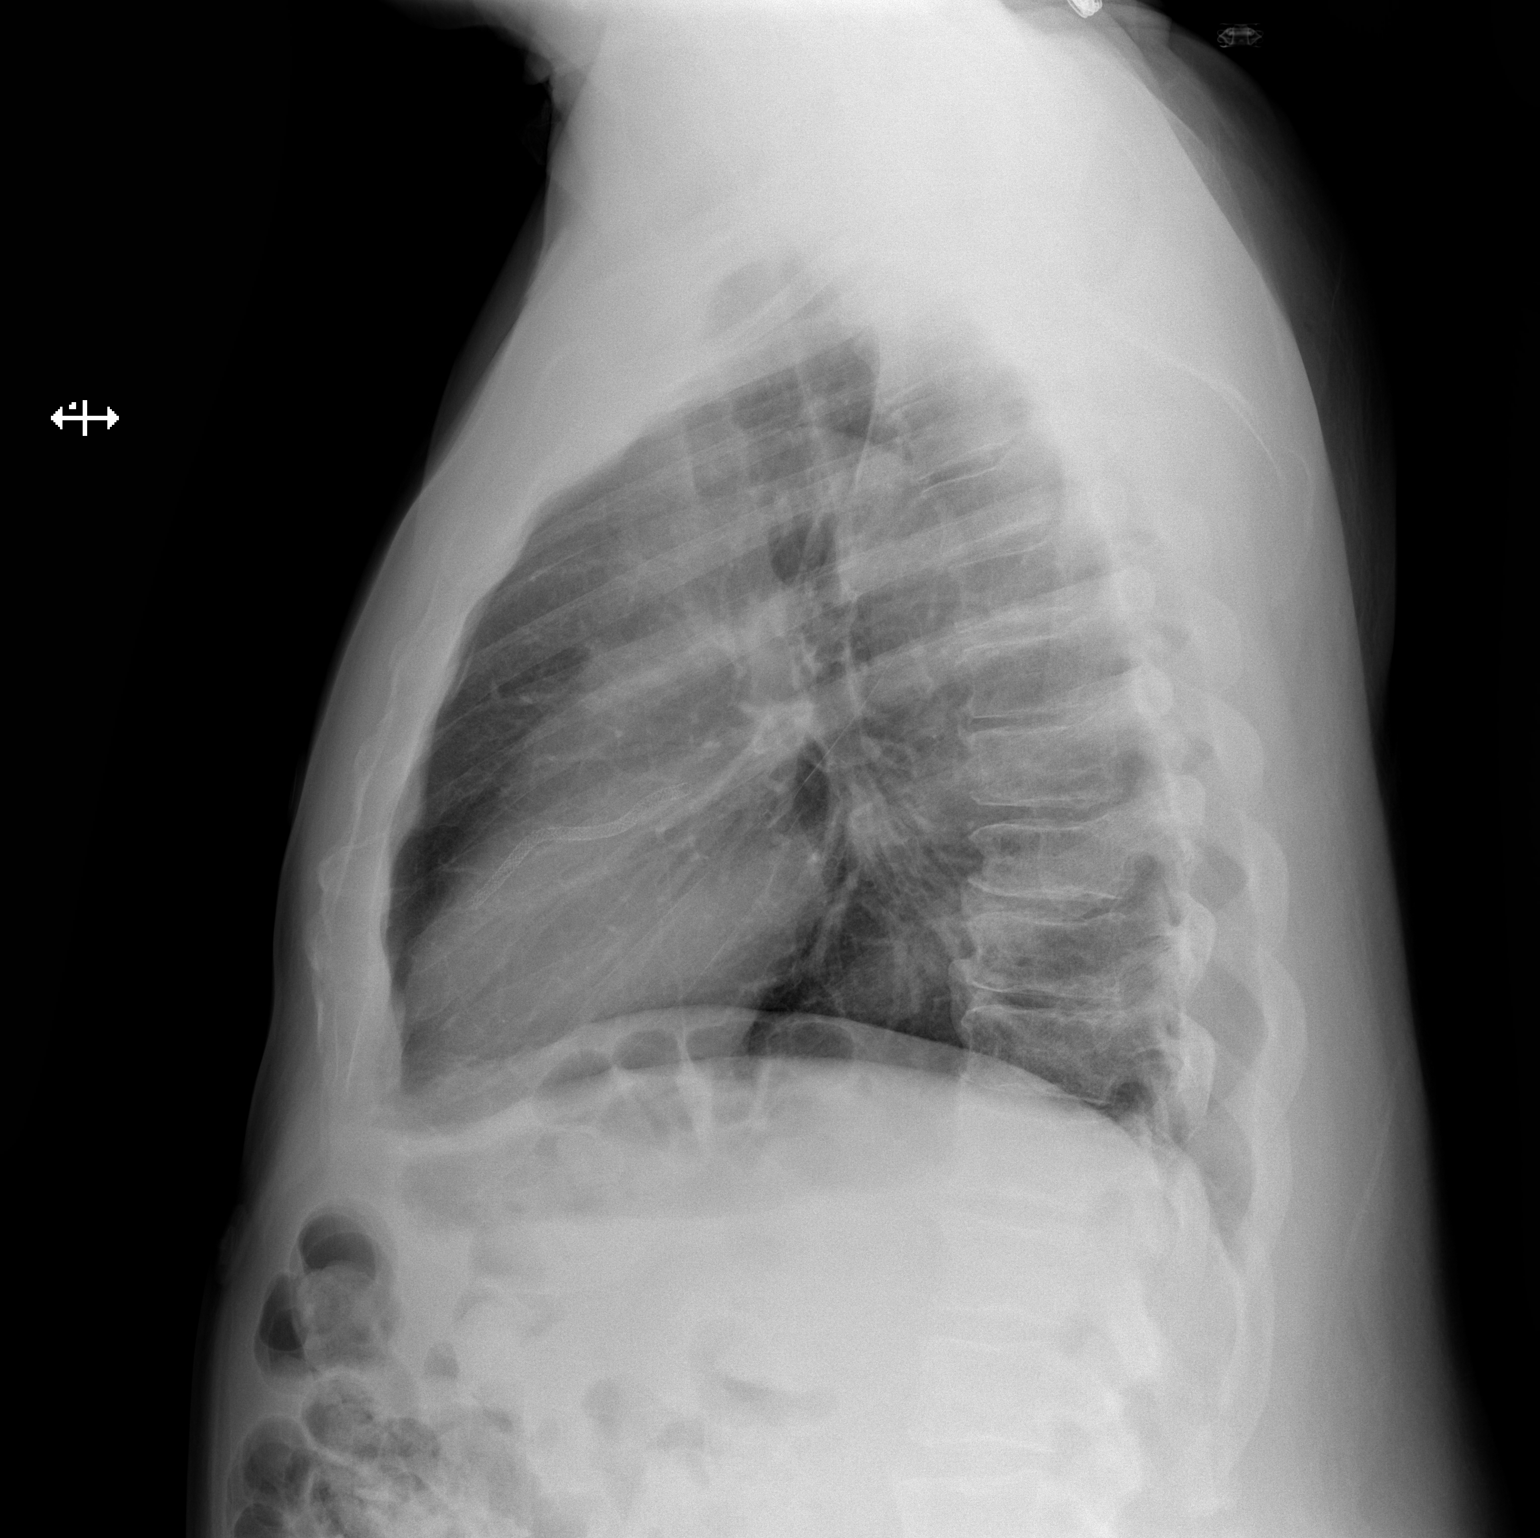

[2 of 2 positions shown; findings below may reference images not displayed]

FINDINGS: Lungs are adequately inflated without consolidation or effusion.
Cardiomediastinal silhouette is within normal. Left coronary stent
unchanged. Mild degenerative change of the spine.
IMPRESSION: No active cardiopulmonary disease.

## 2016-10-14 IMAGING — MR MR LUMBAR SPINE WO/W CM
4 of 7 series · 19 of 48 positions shown · IV contrast (multihance)
Comparison: MRI 07/04/2014

CLINICAL DATA: Chronic back pain.  History of prior lumbar fusion.

EXAM:
MRI LUMBAR SPINE WITHOUT AND WITH CONTRAST
TECHNIQUE: Multiplanar and multiecho pulse sequences of the lumbar spine were
obtained without and with intravenous contrast.
CONTRAST:  8mL MULTIHANCE GADOBENATE DIMEGLUMINE 529 MG/ML IV SOLN

[Series 3: T2 · sagittal · 4.0mm · 0.55mm/px · 4 of 15 slices shown (1 of 2)]
[im 1/15]
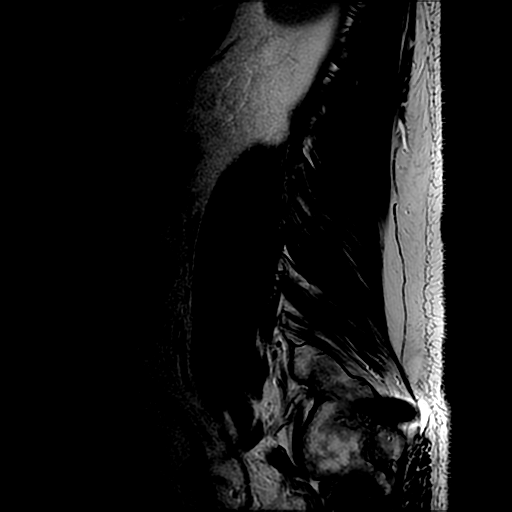
[im 5/15]
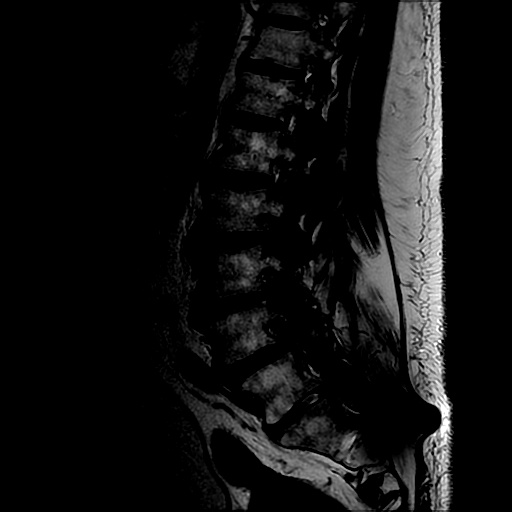
[im 10/15]
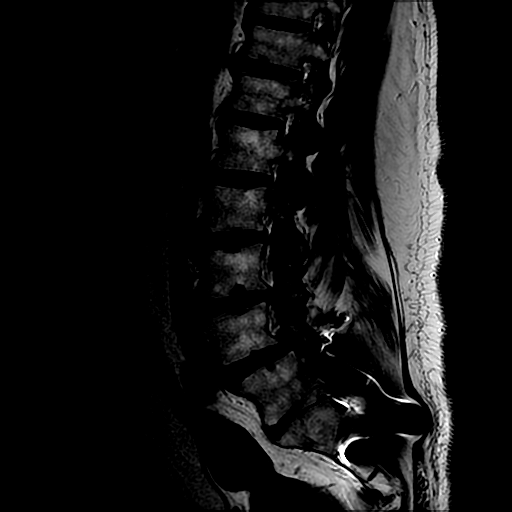
[im 15/15]
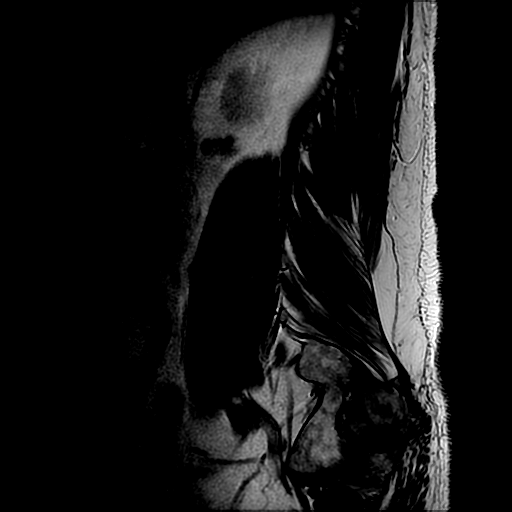

[Series 4: T1 · sagittal · 4.0mm · 0.55mm/px · 3 of 15 slices shown (1 of 2)]
[im 1/15]
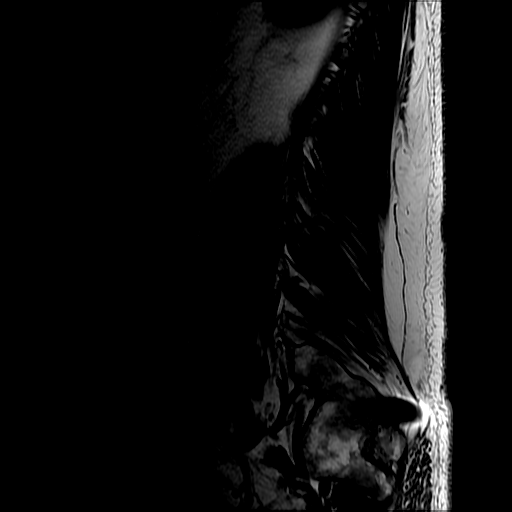
[im 10/15]
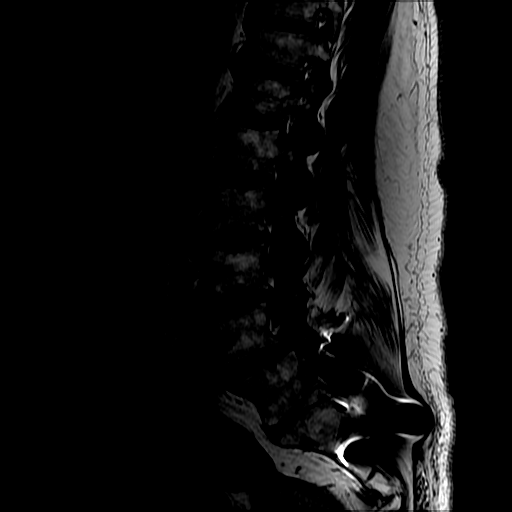
[im 15/15]
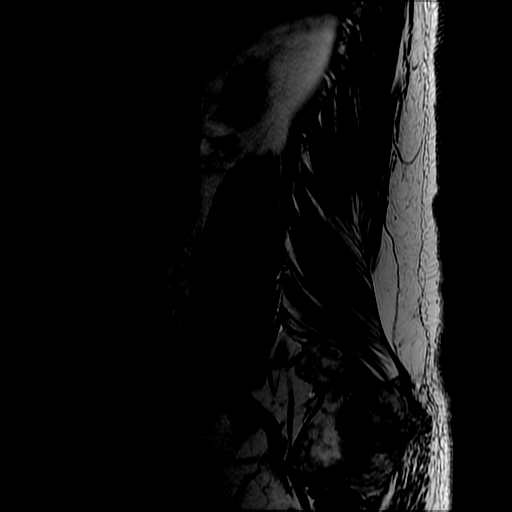

[Series 6: T2 · axial · 4.0mm · 0.39mm/px · z∈[-165,+68]mm · 9 of 51 slices shown (2 of 2)]
[im 1/51]
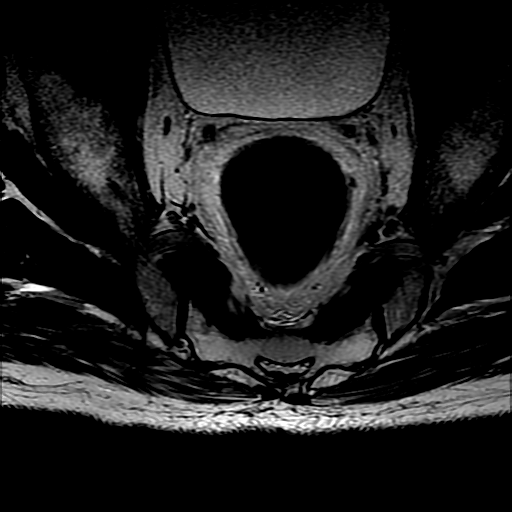
[im 6/51]
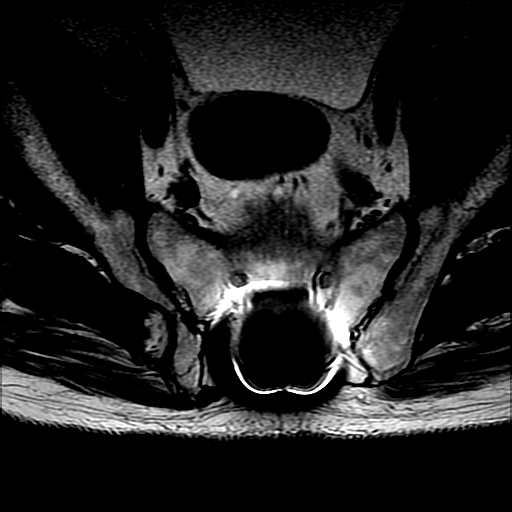
[im 11/51]
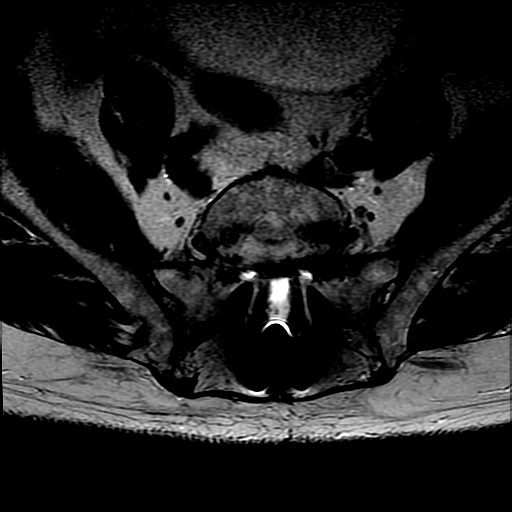
[im 16/51]
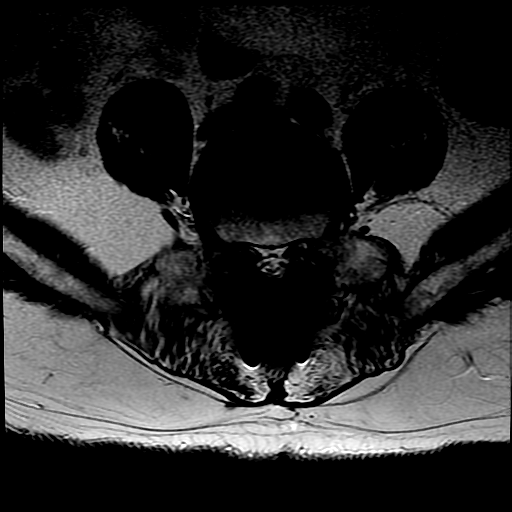
[im 21/51]
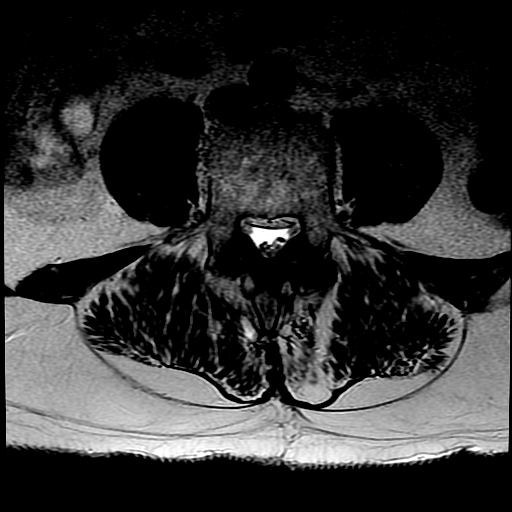
[im 26/51]
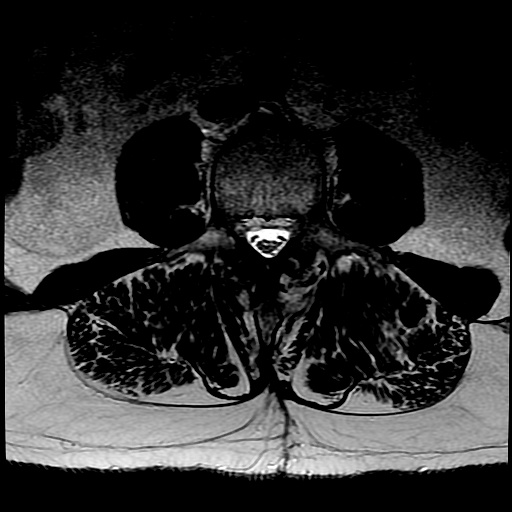
[im 31/51]
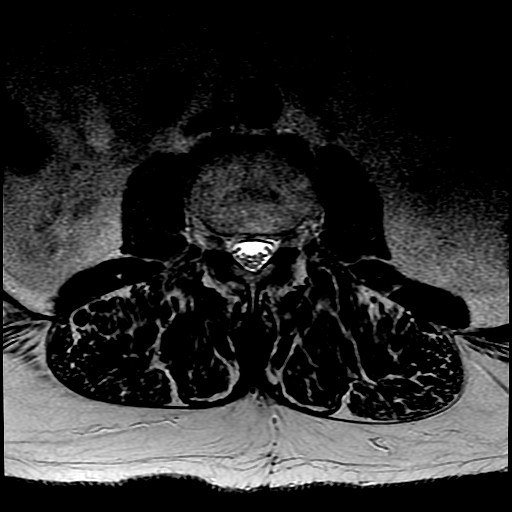
[im 36/51]
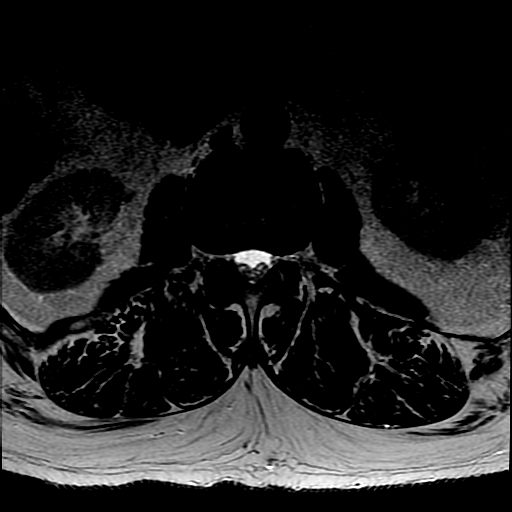
[im 46/51]
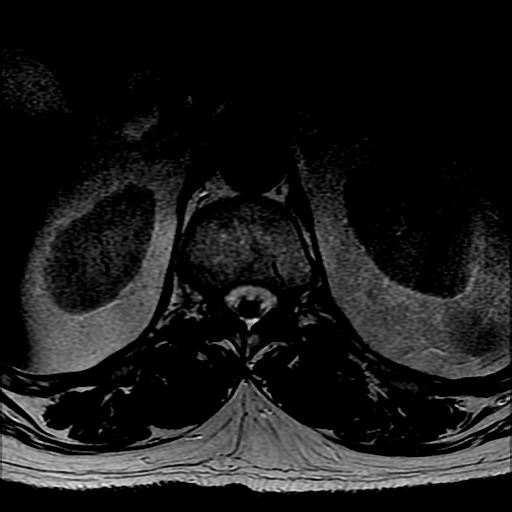

[Series 7: T1 · axial · 4.0mm · 0.39mm/px · z∈[-141,+68]mm · 3 of 51 slices shown (2 of 2)]
[im 6/51]
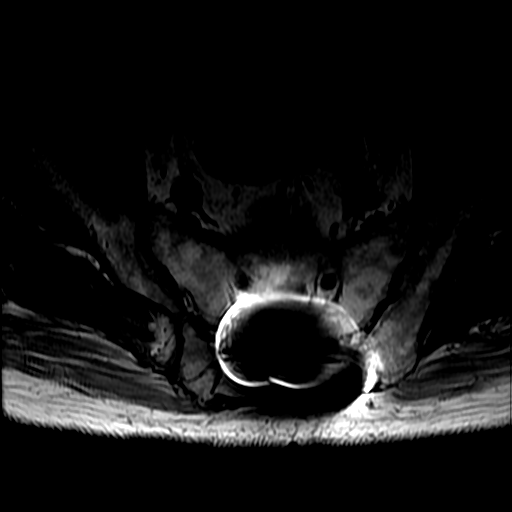
[im 26/51]
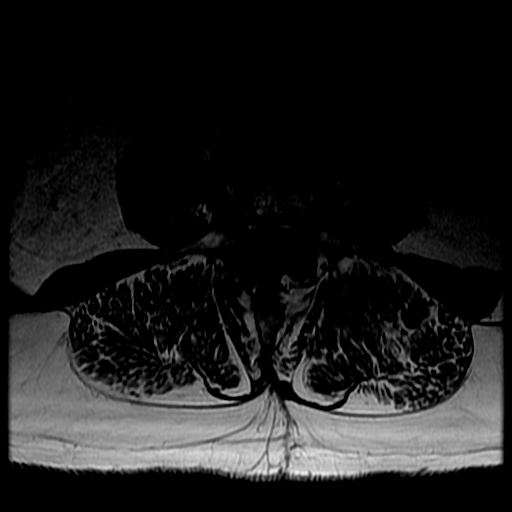
[im 46/51]
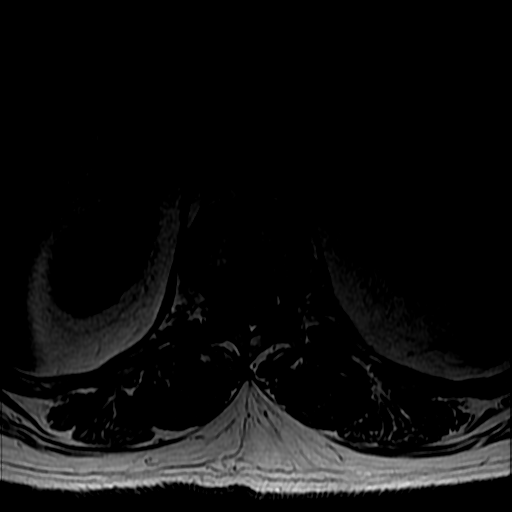

[19 of 48 positions shown; findings below may reference images not displayed]

FINDINGS: Stable overall alignment of the lumbar vertebral bodies with slight
anterolisthesis of L4. Stable old posterior fusion hardware at L5-S1
with moderate artifact. The vertebral bodies demonstrate normal
marrow signal except for mild endplate reactive changes and
scattered Schmorl's nodes.

No significant paraspinal or retroperitoneal findings. There is
moderate distention of the bladder noted.

L1-2: Mild annular bulge and moderate facet disease with mild
bilateral lateral recess encroachment. This appears stable.

L2-3: Diffuse bulging annulus and moderate facet arthrosis with mild
bilateral lateral recess stenosis but no significant spinal
stenosis. Mild bilateral foraminal encroachment appears stable.

L3-4: Diffuse annular bulge and moderate facet disease with stable
mild bilateral lateral recess and foraminal stenosis. No significant
spinal stenosis.

L4-5: Diffuse bulging uncovered disc, osteophytic ridging and severe
facet disease contributing to severe spinal, bilateral lateral
recess and foraminal stenosis which is unchanged.

L5-S1: Significant artifact from the posterior hardware but no
obvious spinal or foraminal stenosis.
IMPRESSION: 1. Stable significant multifactorial spinal, lateral recess and
foraminal stenosis at L4-5.
2. Stable bilateral lateral recess and foraminal stenosis at L2-3
and L3-4.
3. Artifact at L5-S1 from the posterior hardware but no obvious
spinal or foraminal stenosis.

## 2016-10-30 IMAGING — CT CT ABD-PELV W/O CM
2 of 4 series · 16 of 46 positions shown, 18 images · non-contrast
Comparison: MRI of the lumbar spine performed 09/05/2015

CLINICAL DATA: Subacute onset of left lower quadrant abdominal
pain. Initial encounter.

EXAM:
CT ABDOMEN AND PELVIS WITHOUT CONTRAST
TECHNIQUE: Multidetector CT imaging of the abdomen and pelvis was performed
following the standard protocol without IV contrast.

[Series 2: a/p w/o 5mm · axial · non-contrast · 0.85mm/px · z∈[-499,-24]mm · 13 of 105 slices shown, 15 images]
[im 5/105  soft-tissue]
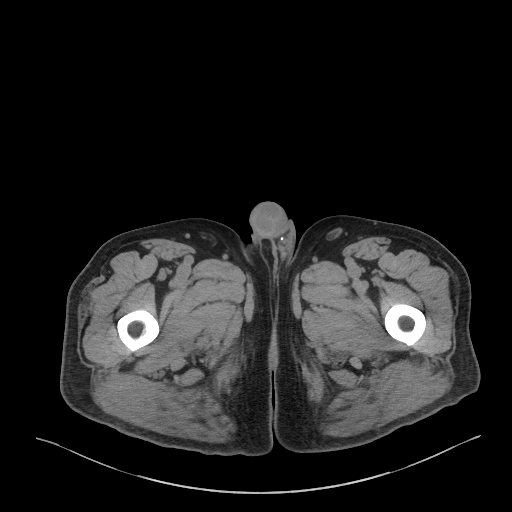
[im 5/105  bone]
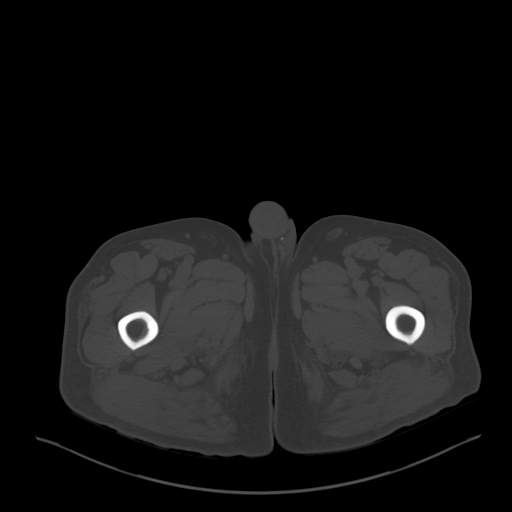
[im 15/105  soft-tissue]
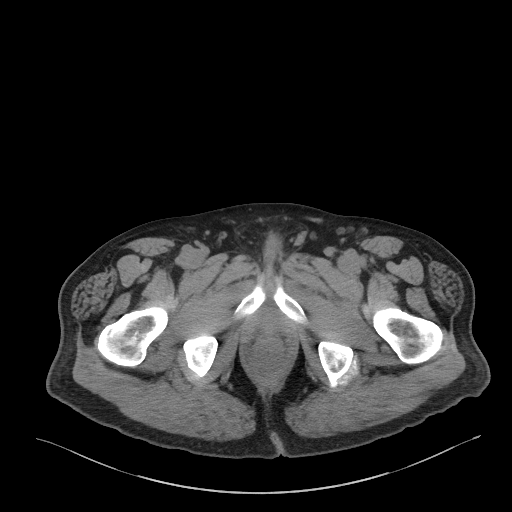
[im 24/105  soft-tissue]
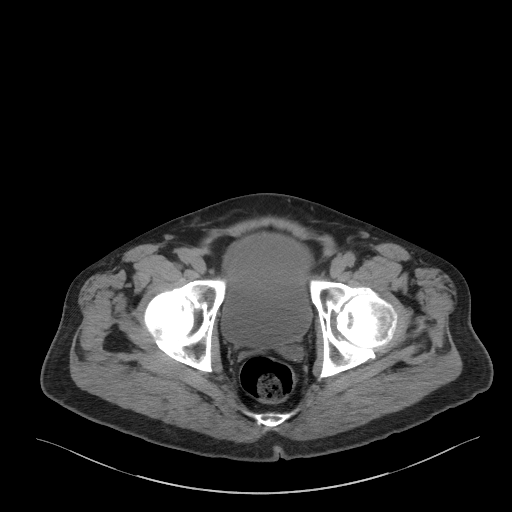
[im 29/105  soft-tissue]
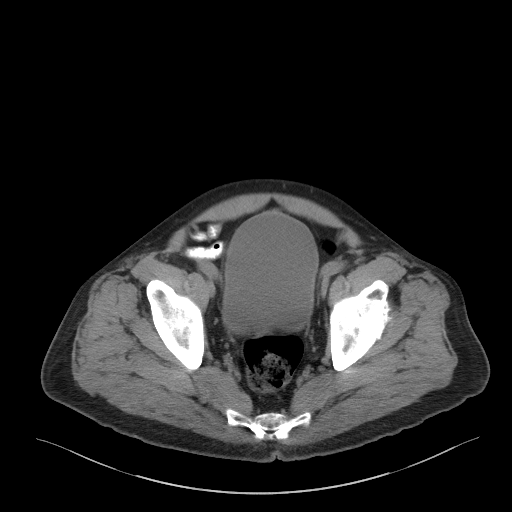
[im 38/105  soft-tissue]
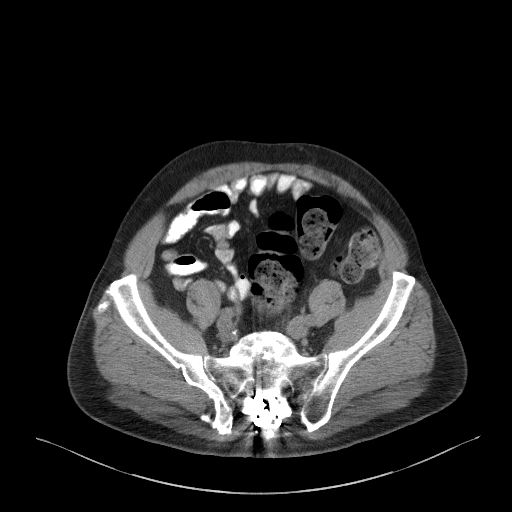
[im 43/105  soft-tissue]
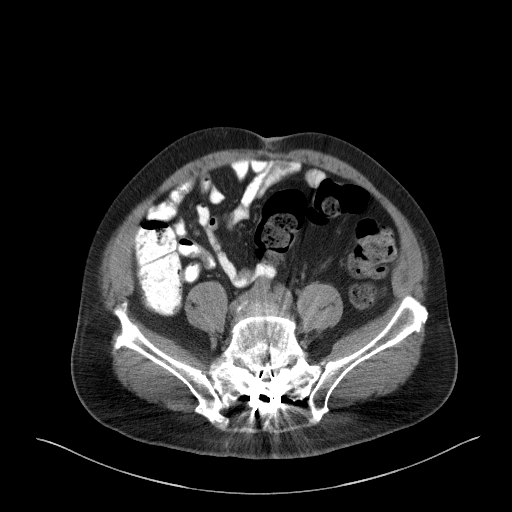
[im 53/105  soft-tissue]
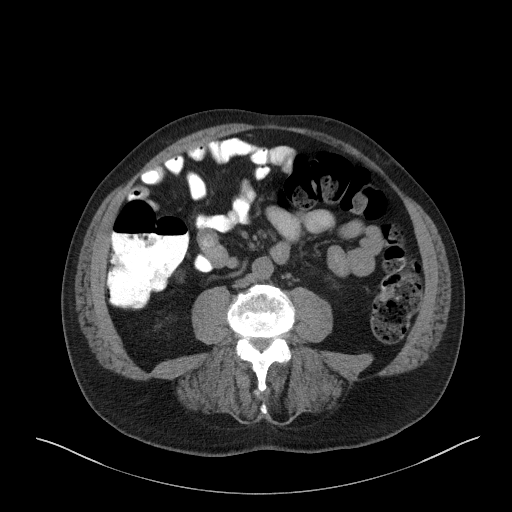
[im 62/105  soft-tissue]
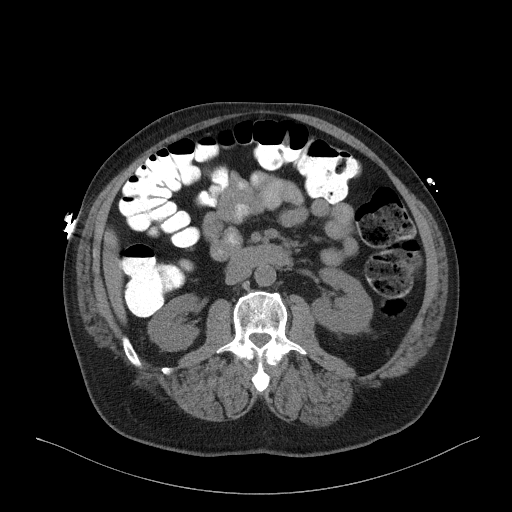
[im 67/105  soft-tissue]
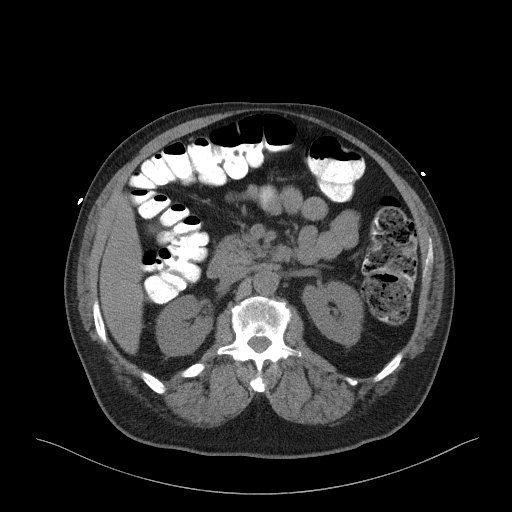
[im 67/105  bone]
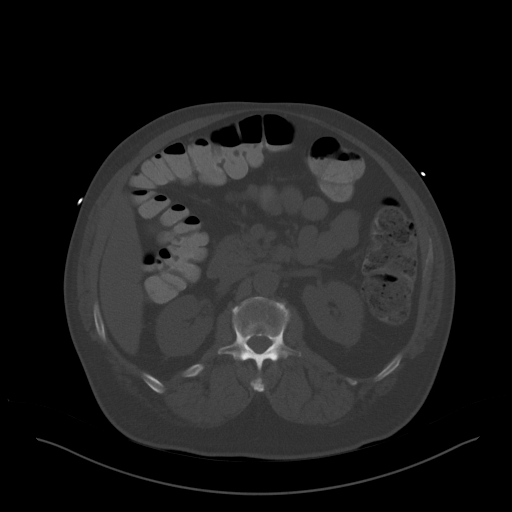
[im 76/105  soft-tissue]
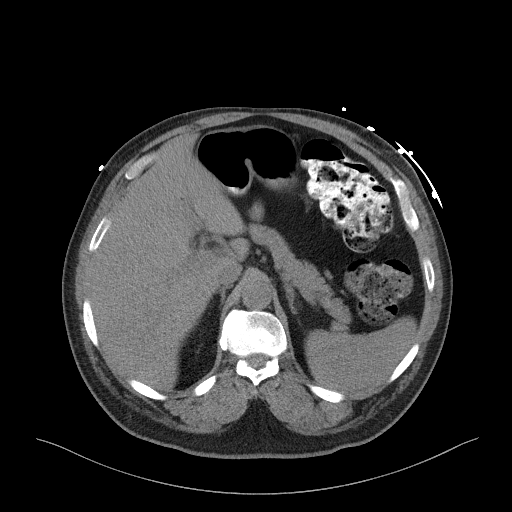
[im 81/105  soft-tissue]
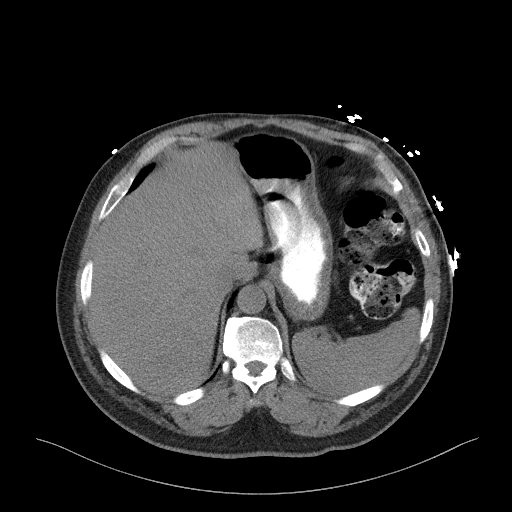
[im 90/105  soft-tissue]
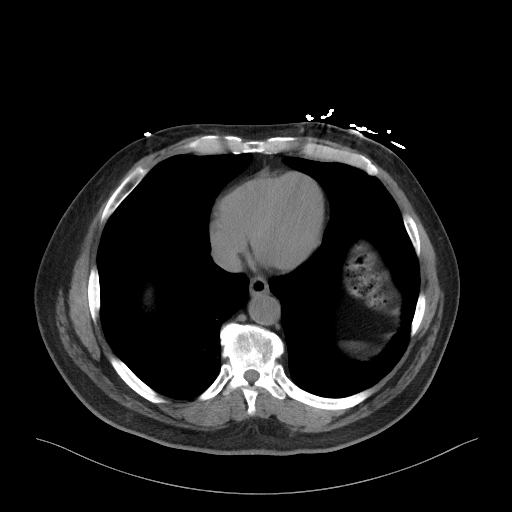
[im 100/105  soft-tissue]
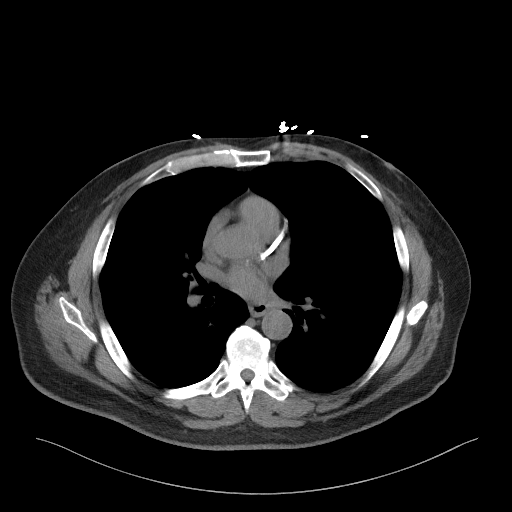

[Series 5: a/p w/o cor · coronal · non-contrast · 0.83mm/px · 3 of 151 slices shown]
[im 51/151  soft-tissue]
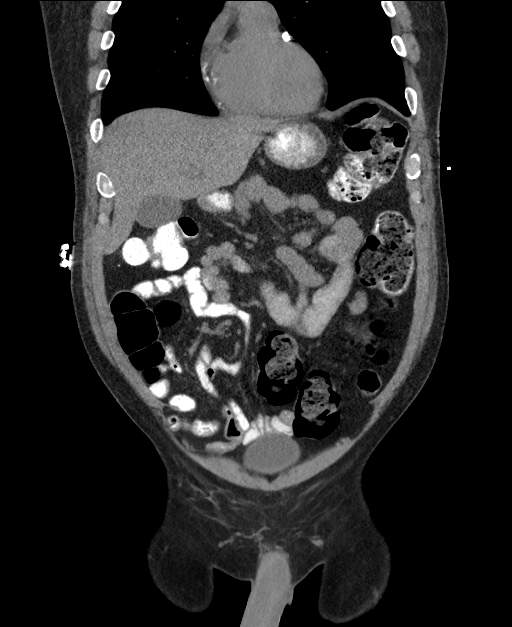
[im 67/151  soft-tissue]
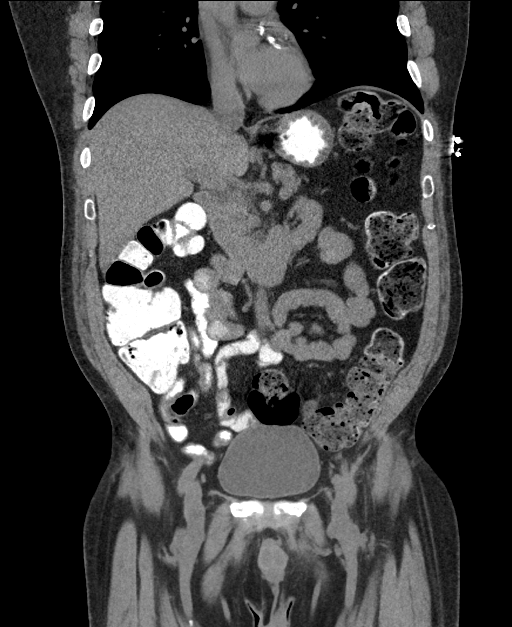
[im 84/151  soft-tissue]
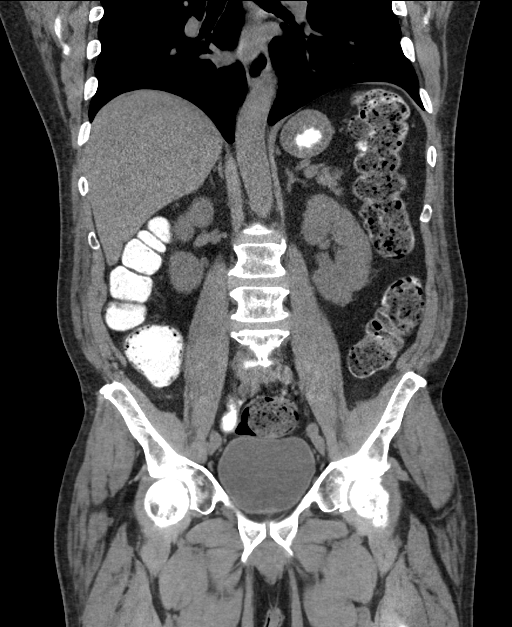

[16 of 46 positions shown; findings below may reference images not displayed]

FINDINGS: Minimal bibasilar atelectasis is noted. Diffuse coronary artery
calcifications are seen.

The liver and spleen are unremarkable in appearance. The gallbladder
is within normal limits. The pancreas and adrenal glands are
unremarkable.

The kidneys are unremarkable in appearance. There is no evidence of
hydronephrosis. No renal or ureteral stones are seen. Mild
nonspecific perinephric stranding is noted bilaterally.

No free fluid is identified. The small bowel is unremarkable in
appearance. The stomach is within normal limits. No acute vascular
abnormalities are seen.

The appendix is decompressed and difficult to fully assess. Contrast
progresses to the level of the descending colon. The colon is
largely filled with stool and unremarkable in appearance.

The bladder is moderately distended and grossly unremarkable in
appearance. The prostate remains normal in size. No inguinal
lymphadenopathy is seen.

No acute osseous abnormalities are identified. There is mild grade 1
anterolisthesis of L4 on L5, reflecting underlying facet disease.
Fusion hardware is noted along the posterior elements at the sacrum.
IMPRESSION: 1. No acute abnormality seen to explain the patient's symptoms.
2. Diffuse coronary calcifications seen.
3. Mild degenerative change at the lower lumbar spine.

## 2016-10-30 IMAGING — DX DG CHEST 2V
2 series · 2 of 2 positions shown · non-contrast
Comparison: Chest radiograph performed 09/02/2015

CLINICAL DATA: Shortness of breath and productive cough, acute
onset. Initial encounter.

EXAM:
CHEST  2 VIEW

[chest pa]
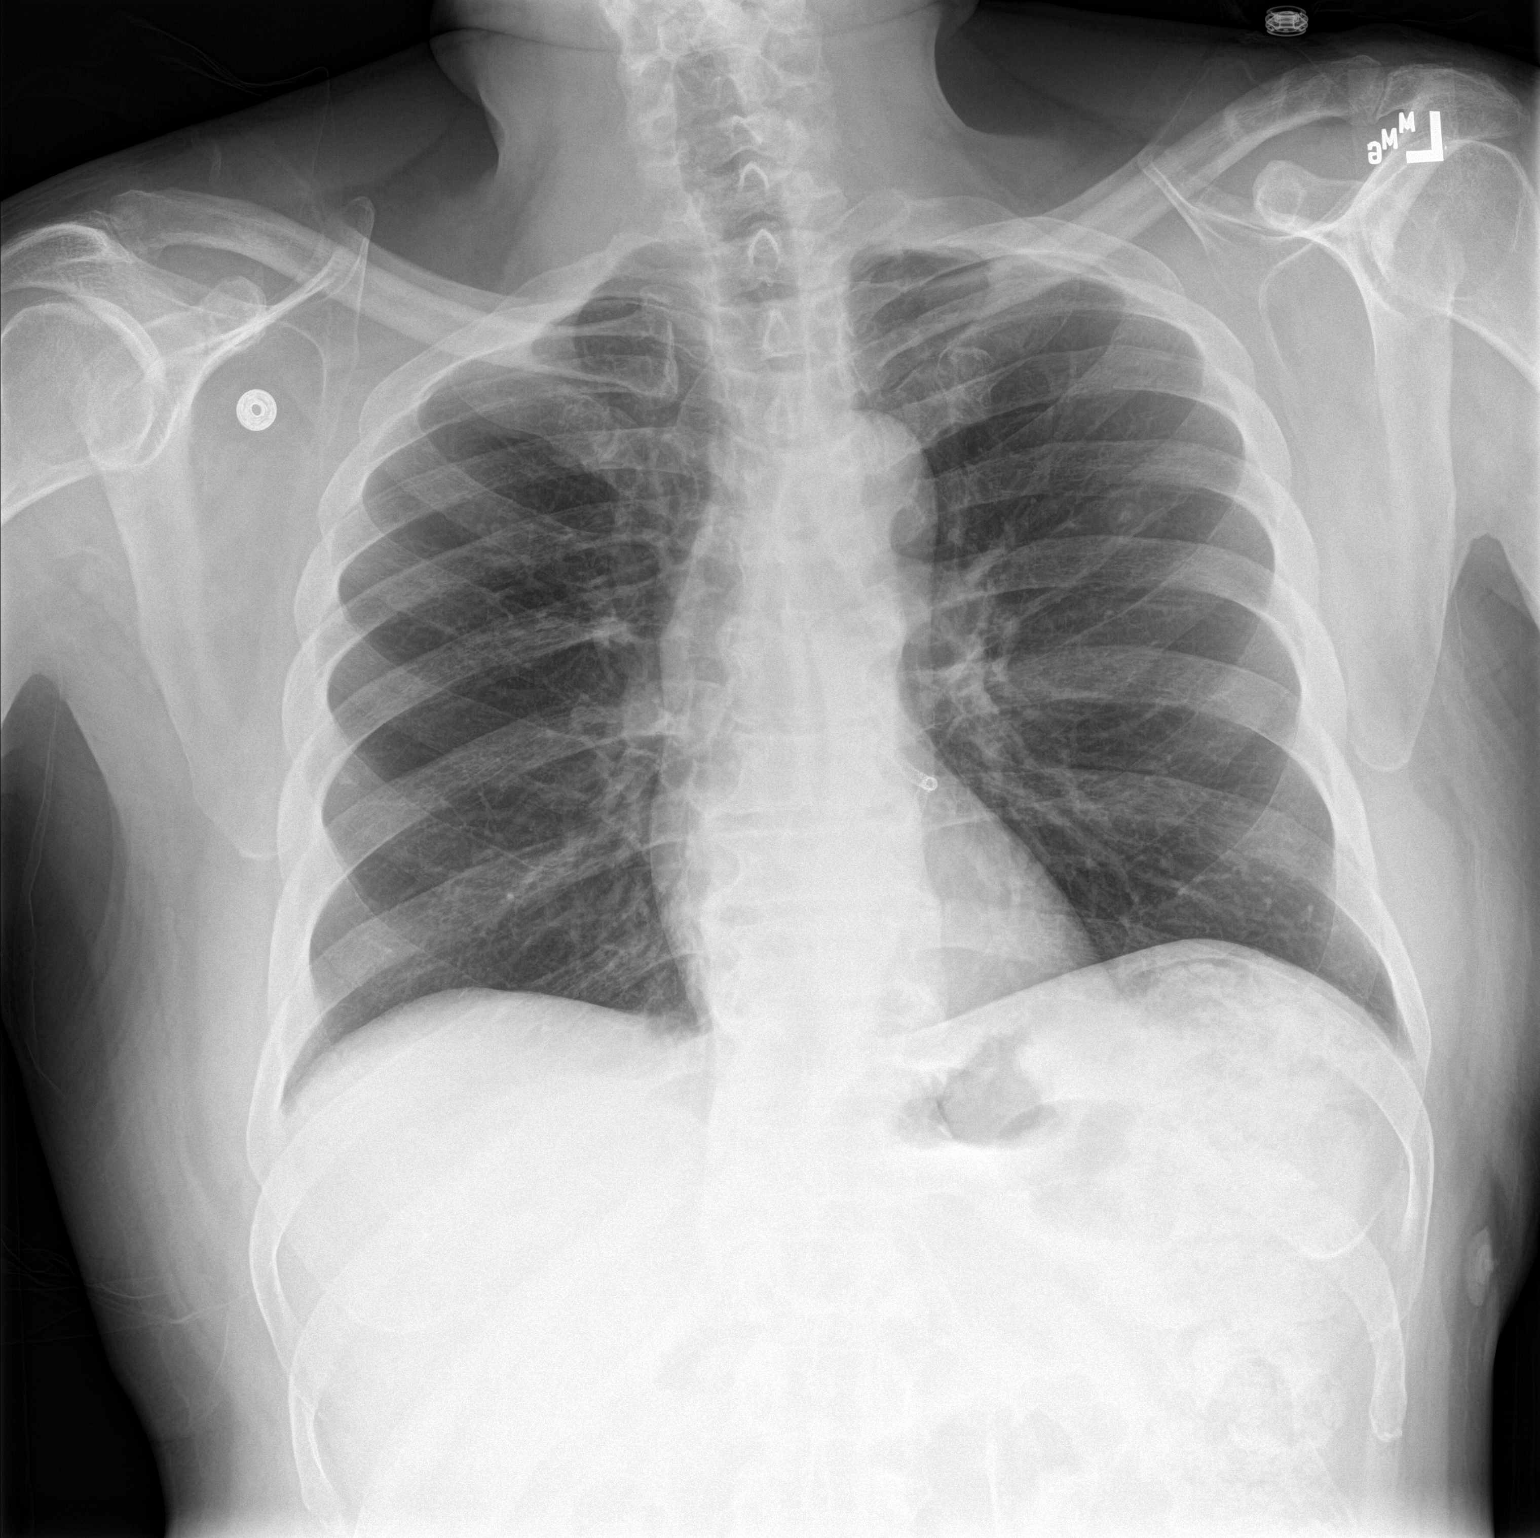

[chest lat]
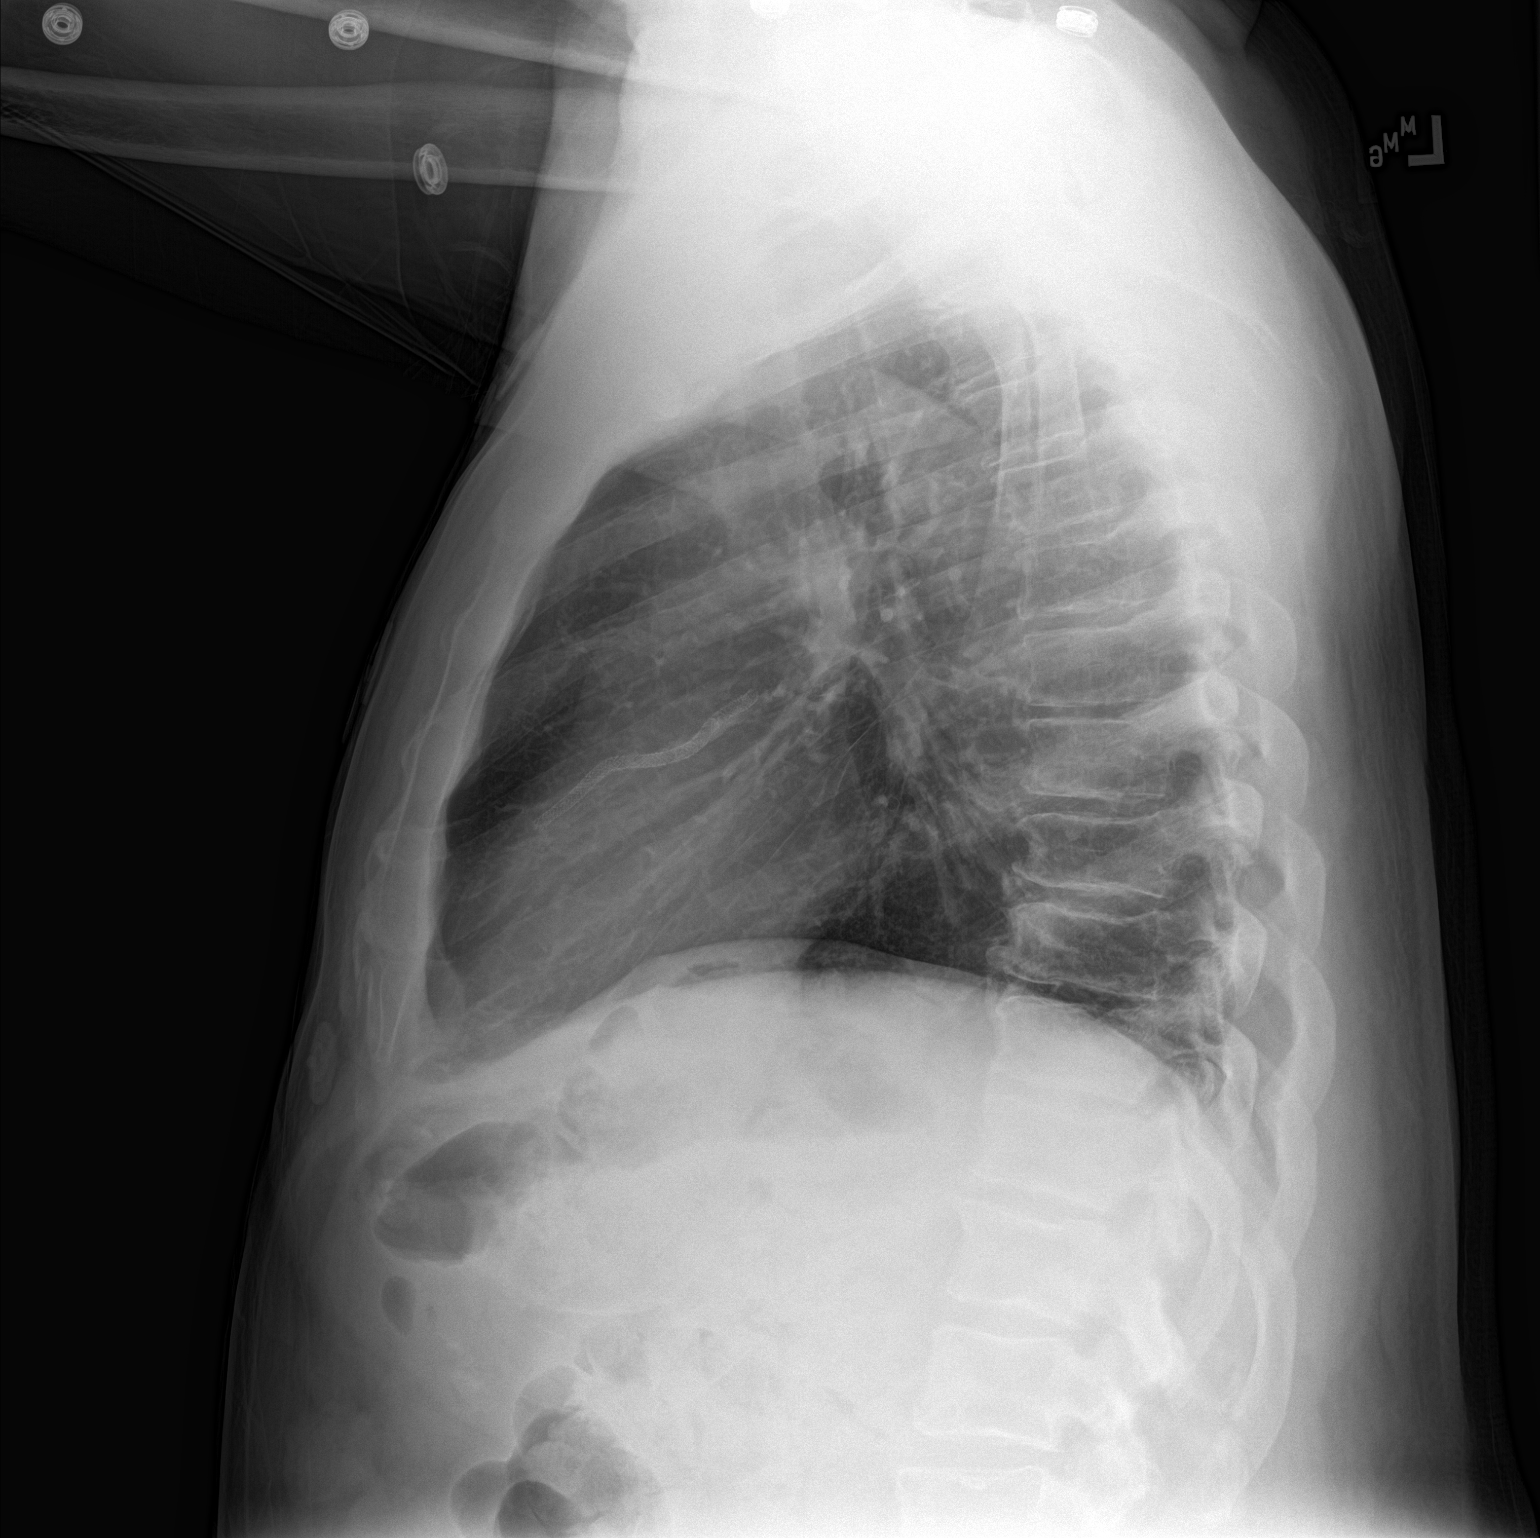

[2 of 2 positions shown; findings below may reference images not displayed]

FINDINGS: The lungs are well-aerated and clear. There is no evidence of focal
opacification, pleural effusion or pneumothorax.

The heart is normal in size; the mediastinal contour is within
normal limits. No acute osseous abnormalities are seen. Mild
degenerative change is noted at the acromioclavicular joints
bilaterally.
IMPRESSION: No acute cardiopulmonary process seen.

## 2016-11-03 IMAGING — CR DG CHEST 2V
2 series · 2 of 2 positions shown · non-contrast
Comparison: September 21, 2015

CLINICAL DATA: Chest pain and shortness of breath.

EXAM:
CHEST  2 VIEW

[w chest pa]
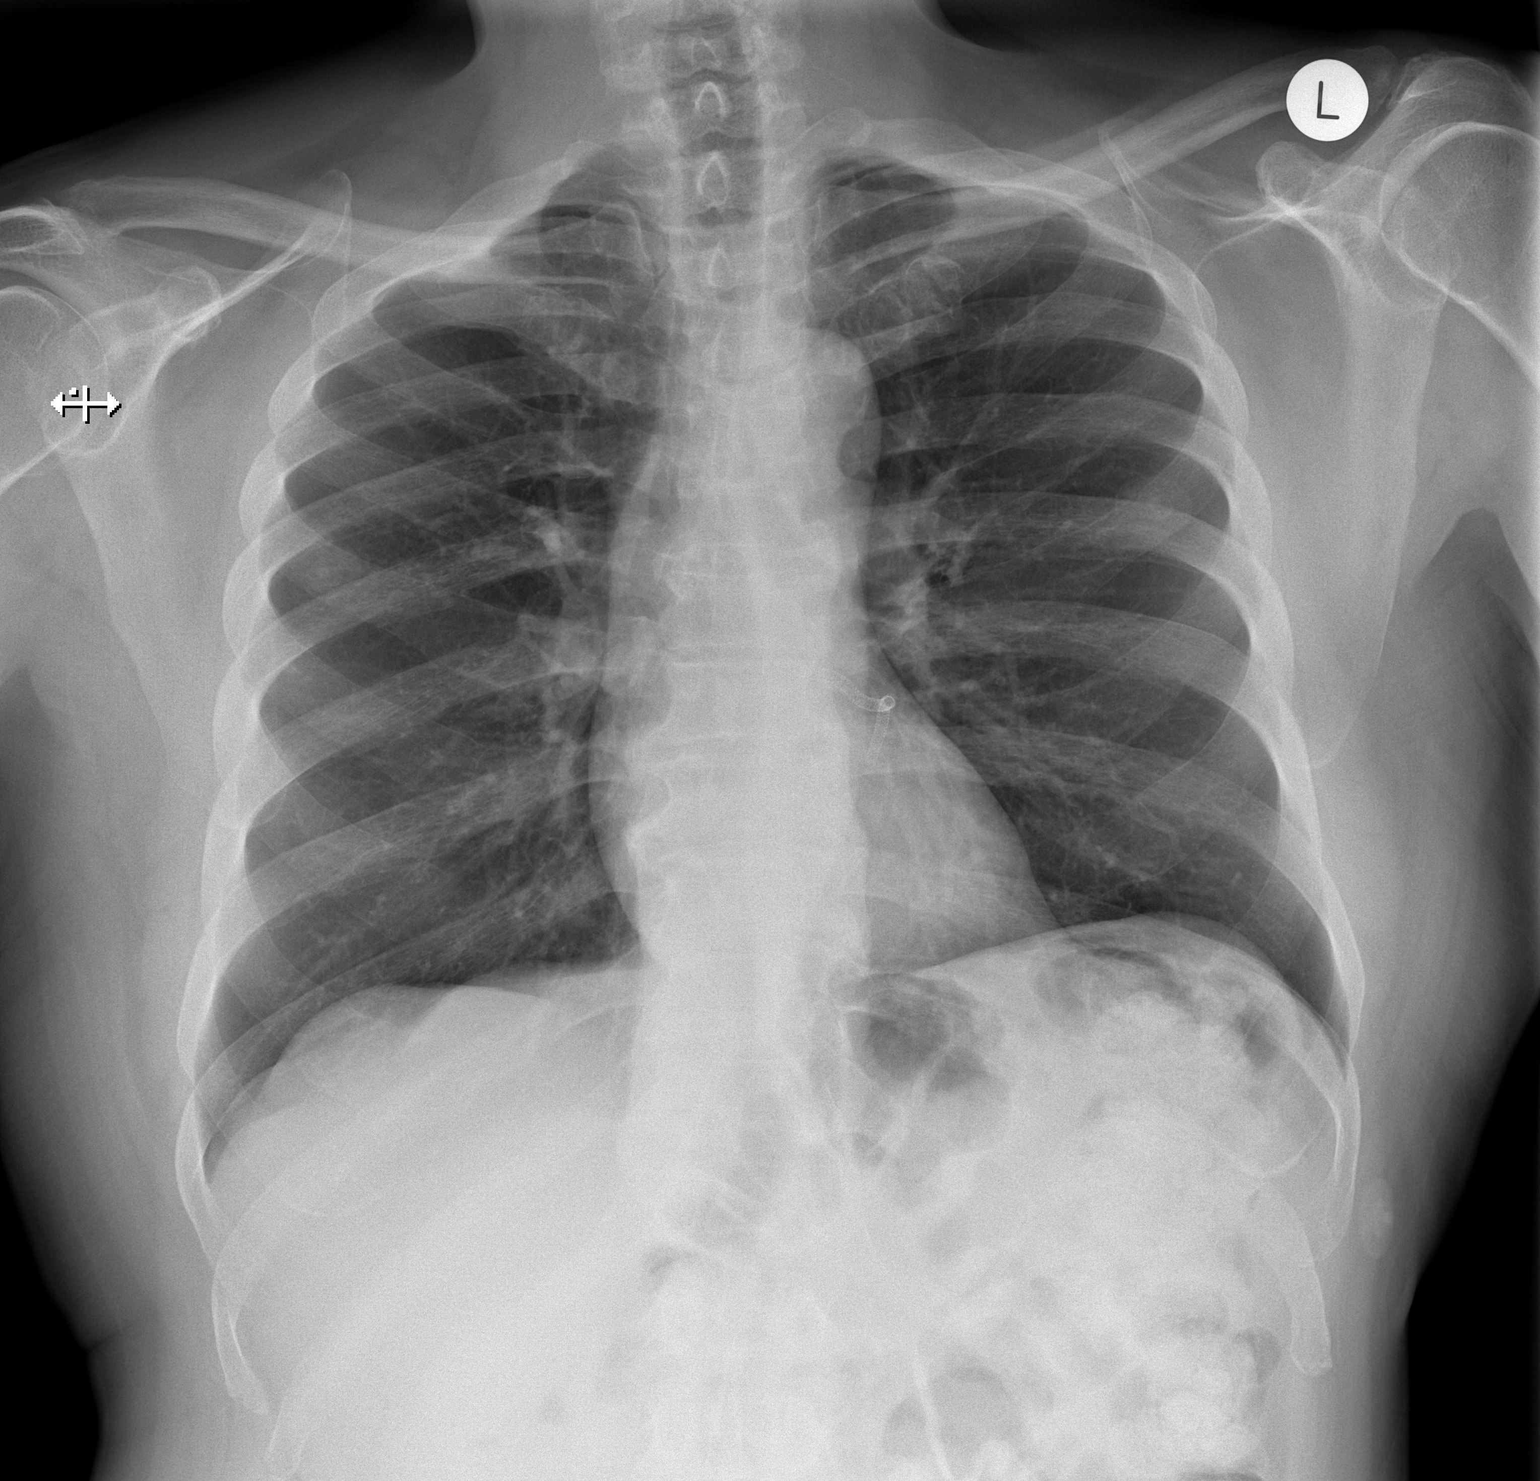

[w chest lat]
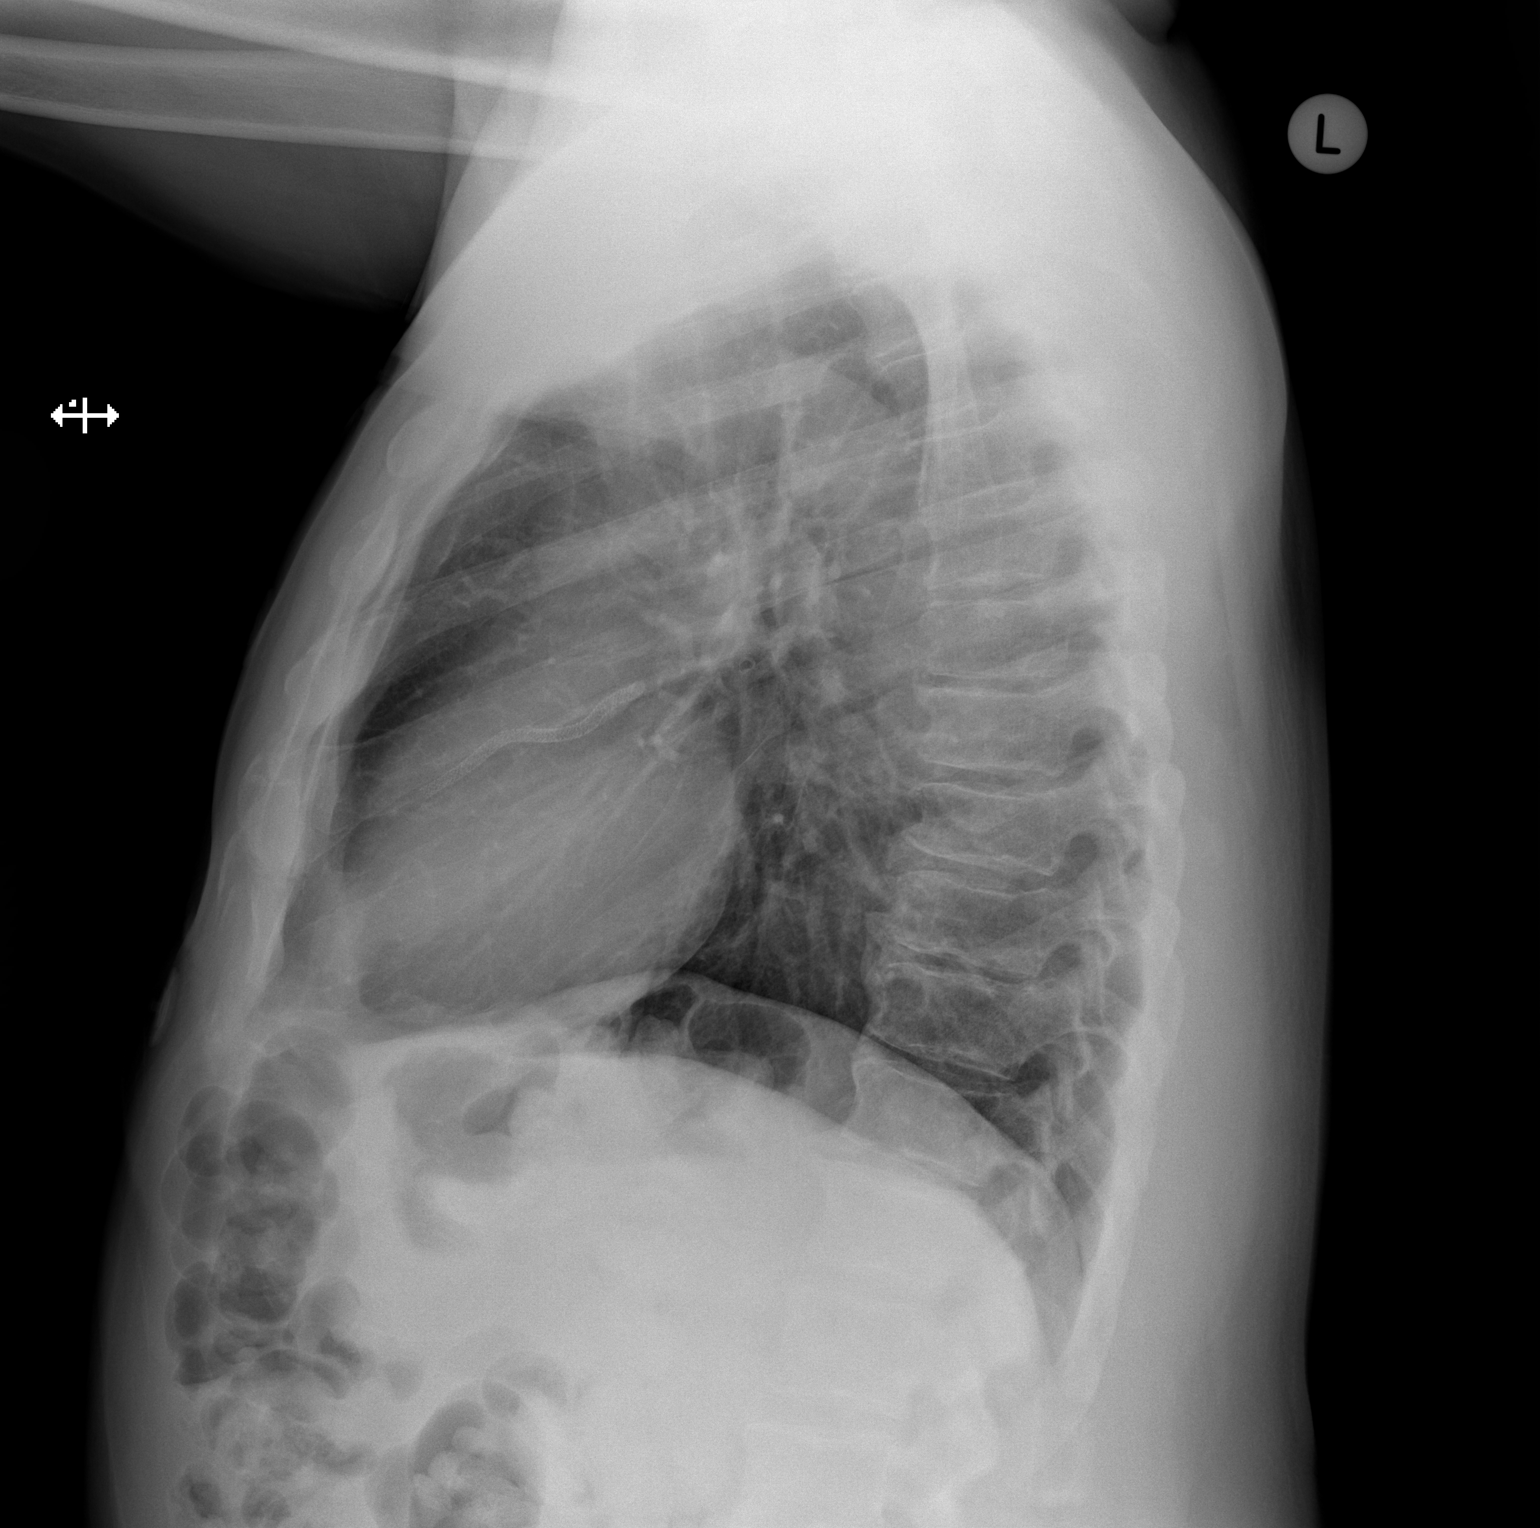

[2 of 2 positions shown; findings below may reference images not displayed]

FINDINGS: There is no edema or consolidation. The heart size and pulmonary
vascularity are normal. No adenopathy. There is a left anterior
descending coronary artery stent present. No pneumothorax. There is
degenerative change in the thoracic spine.
IMPRESSION: No edema or consolidation.

## 2016-11-07 DIAGNOSIS — R079 Chest pain, unspecified: Secondary | ICD-10-CM | POA: Diagnosis not present

## 2016-11-07 DIAGNOSIS — E1165 Type 2 diabetes mellitus with hyperglycemia: Secondary | ICD-10-CM | POA: Diagnosis not present

## 2016-11-07 DIAGNOSIS — E11649 Type 2 diabetes mellitus with hypoglycemia without coma: Secondary | ICD-10-CM | POA: Diagnosis not present

## 2016-11-07 DIAGNOSIS — R0600 Dyspnea, unspecified: Secondary | ICD-10-CM | POA: Diagnosis not present

## 2016-11-07 DIAGNOSIS — Z955 Presence of coronary angioplasty implant and graft: Secondary | ICD-10-CM | POA: Diagnosis not present

## 2016-11-07 DIAGNOSIS — I2699 Other pulmonary embolism without acute cor pulmonale: Secondary | ICD-10-CM | POA: Diagnosis not present

## 2016-11-07 DIAGNOSIS — E1140 Type 2 diabetes mellitus with diabetic neuropathy, unspecified: Secondary | ICD-10-CM | POA: Diagnosis not present

## 2016-11-07 DIAGNOSIS — I2510 Atherosclerotic heart disease of native coronary artery without angina pectoris: Secondary | ICD-10-CM | POA: Diagnosis not present

## 2016-11-07 DIAGNOSIS — M9420 Chondromalacia, unspecified site: Secondary | ICD-10-CM | POA: Diagnosis not present

## 2016-11-07 DIAGNOSIS — M109 Gout, unspecified: Secondary | ICD-10-CM | POA: Diagnosis not present

## 2016-11-07 DIAGNOSIS — Z794 Long term (current) use of insulin: Secondary | ICD-10-CM | POA: Diagnosis not present

## 2016-11-07 DIAGNOSIS — I10 Essential (primary) hypertension: Secondary | ICD-10-CM | POA: Diagnosis not present

## 2016-11-08 ENCOUNTER — Other Ambulatory Visit: Payer: Self-pay | Admitting: Cardiovascular Disease

## 2016-11-09 DIAGNOSIS — E1165 Type 2 diabetes mellitus with hyperglycemia: Secondary | ICD-10-CM | POA: Diagnosis not present

## 2016-11-09 DIAGNOSIS — E1140 Type 2 diabetes mellitus with diabetic neuropathy, unspecified: Secondary | ICD-10-CM | POA: Diagnosis not present

## 2016-11-09 DIAGNOSIS — I2699 Other pulmonary embolism without acute cor pulmonale: Secondary | ICD-10-CM | POA: Diagnosis not present

## 2016-11-10 DIAGNOSIS — E1140 Type 2 diabetes mellitus with diabetic neuropathy, unspecified: Secondary | ICD-10-CM | POA: Diagnosis not present

## 2016-11-10 DIAGNOSIS — I2699 Other pulmonary embolism without acute cor pulmonale: Secondary | ICD-10-CM | POA: Diagnosis not present

## 2016-11-10 DIAGNOSIS — E1165 Type 2 diabetes mellitus with hyperglycemia: Secondary | ICD-10-CM | POA: Diagnosis not present

## 2016-11-11 DIAGNOSIS — I2699 Other pulmonary embolism without acute cor pulmonale: Secondary | ICD-10-CM | POA: Diagnosis not present

## 2016-11-11 DIAGNOSIS — E1165 Type 2 diabetes mellitus with hyperglycemia: Secondary | ICD-10-CM | POA: Diagnosis not present

## 2016-11-11 DIAGNOSIS — E1140 Type 2 diabetes mellitus with diabetic neuropathy, unspecified: Secondary | ICD-10-CM | POA: Diagnosis not present

## 2016-11-12 DIAGNOSIS — I2699 Other pulmonary embolism without acute cor pulmonale: Secondary | ICD-10-CM | POA: Diagnosis not present

## 2016-11-12 DIAGNOSIS — I259 Chronic ischemic heart disease, unspecified: Secondary | ICD-10-CM | POA: Insufficient documentation

## 2016-11-12 DIAGNOSIS — I480 Paroxysmal atrial fibrillation: Secondary | ICD-10-CM | POA: Insufficient documentation

## 2016-11-13 DIAGNOSIS — R0600 Dyspnea, unspecified: Secondary | ICD-10-CM | POA: Diagnosis not present

## 2016-11-13 DIAGNOSIS — R042 Hemoptysis: Secondary | ICD-10-CM | POA: Diagnosis not present

## 2016-11-13 DIAGNOSIS — I2699 Other pulmonary embolism without acute cor pulmonale: Secondary | ICD-10-CM | POA: Diagnosis not present

## 2016-11-13 DIAGNOSIS — J9811 Atelectasis: Secondary | ICD-10-CM | POA: Diagnosis not present

## 2016-11-14 DIAGNOSIS — I2699 Other pulmonary embolism without acute cor pulmonale: Secondary | ICD-10-CM | POA: Diagnosis not present

## 2016-11-15 DIAGNOSIS — I2699 Other pulmonary embolism without acute cor pulmonale: Secondary | ICD-10-CM | POA: Diagnosis not present

## 2016-11-22 ENCOUNTER — Emergency Department (HOSPITAL_COMMUNITY): Payer: Medicare Other

## 2016-11-22 ENCOUNTER — Encounter (HOSPITAL_COMMUNITY): Payer: Self-pay | Admitting: Emergency Medicine

## 2016-11-22 ENCOUNTER — Emergency Department (HOSPITAL_COMMUNITY)
Admission: EM | Admit: 2016-11-22 | Discharge: 2016-11-26 | Disposition: A | Discharge status: Home / Self Care | Payer: Medicare Other | Attending: Emergency Medicine | Admitting: Emergency Medicine

## 2016-11-22 DIAGNOSIS — I129 Hypertensive chronic kidney disease with stage 1 through stage 4 chronic kidney disease, or unspecified chronic kidney disease: Secondary | ICD-10-CM | POA: Diagnosis not present

## 2016-11-22 DIAGNOSIS — R45851 Suicidal ideations: Secondary | ICD-10-CM

## 2016-11-22 DIAGNOSIS — Z794 Long term (current) use of insulin: Secondary | ICD-10-CM | POA: Diagnosis not present

## 2016-11-22 DIAGNOSIS — J45909 Unspecified asthma, uncomplicated: Secondary | ICD-10-CM | POA: Diagnosis not present

## 2016-11-22 DIAGNOSIS — I2699 Other pulmonary embolism without acute cor pulmonale: Secondary | ICD-10-CM

## 2016-11-22 DIAGNOSIS — N183 Chronic kidney disease, stage 3 (moderate): Secondary | ICD-10-CM | POA: Diagnosis not present

## 2016-11-22 DIAGNOSIS — Z7982 Long term (current) use of aspirin: Secondary | ICD-10-CM | POA: Diagnosis not present

## 2016-11-22 DIAGNOSIS — F1994 Other psychoactive substance use, unspecified with psychoactive substance-induced mood disorder: Secondary | ICD-10-CM | POA: Diagnosis not present

## 2016-11-22 DIAGNOSIS — R739 Hyperglycemia, unspecified: Secondary | ICD-10-CM

## 2016-11-22 DIAGNOSIS — Z79899 Other long term (current) drug therapy: Secondary | ICD-10-CM | POA: Insufficient documentation

## 2016-11-22 DIAGNOSIS — E1165 Type 2 diabetes mellitus with hyperglycemia: Secondary | ICD-10-CM | POA: Diagnosis not present

## 2016-11-22 DIAGNOSIS — R0602 Shortness of breath: Secondary | ICD-10-CM | POA: Diagnosis not present

## 2016-11-22 DIAGNOSIS — F1490 Cocaine use, unspecified, uncomplicated: Secondary | ICD-10-CM | POA: Diagnosis not present

## 2016-11-22 DIAGNOSIS — F1424 Cocaine dependence with cocaine-induced mood disorder: Secondary | ICD-10-CM

## 2016-11-22 DIAGNOSIS — E1122 Type 2 diabetes mellitus with diabetic chronic kidney disease: Secondary | ICD-10-CM | POA: Insufficient documentation

## 2016-11-22 DIAGNOSIS — E1140 Type 2 diabetes mellitus with diabetic neuropathy, unspecified: Secondary | ICD-10-CM | POA: Insufficient documentation

## 2016-11-22 DIAGNOSIS — Z7902 Long term (current) use of antithrombotics/antiplatelets: Secondary | ICD-10-CM | POA: Diagnosis not present

## 2016-11-22 DIAGNOSIS — R05 Cough: Secondary | ICD-10-CM | POA: Diagnosis not present

## 2016-11-22 DIAGNOSIS — I2510 Atherosclerotic heart disease of native coronary artery without angina pectoris: Secondary | ICD-10-CM | POA: Diagnosis not present

## 2016-11-22 DIAGNOSIS — F1420 Cocaine dependence, uncomplicated: Secondary | ICD-10-CM | POA: Diagnosis present

## 2016-11-22 DIAGNOSIS — F1410 Cocaine abuse, uncomplicated: Secondary | ICD-10-CM

## 2016-11-22 HISTORY — DX: Other pulmonary embolism without acute cor pulmonale: I26.99

## 2016-11-22 LAB — CBC
HCT: 31.2 % — ABNORMAL LOW (ref 39.0–52.0)
Hemoglobin: 10.2 g/dL — ABNORMAL LOW (ref 13.0–17.0)
MCH: 26.4 pg (ref 26.0–34.0)
MCHC: 32.7 g/dL (ref 30.0–36.0)
MCV: 80.6 fL (ref 78.0–100.0)
PLATELETS: 153 10*3/uL (ref 150–400)
RBC: 3.87 MIL/uL — ABNORMAL LOW (ref 4.22–5.81)
RDW: 13.9 % (ref 11.5–15.5)
WBC: 6.9 10*3/uL (ref 4.0–10.5)

## 2016-11-22 LAB — RAPID URINE DRUG SCREEN, HOSP PERFORMED
AMPHETAMINES: NOT DETECTED
BARBITURATES: NOT DETECTED
BENZODIAZEPINES: NOT DETECTED
COCAINE: POSITIVE — AB
OPIATES: NOT DETECTED
TETRAHYDROCANNABINOL: NOT DETECTED

## 2016-11-22 LAB — BASIC METABOLIC PANEL
Anion gap: 6 (ref 5–15)
BUN: 17 mg/dL (ref 6–20)
CHLORIDE: 110 mmol/L (ref 101–111)
CO2: 22 mmol/L (ref 22–32)
CREATININE: 1.54 mg/dL — AB (ref 0.61–1.24)
Calcium: 8.4 mg/dL — ABNORMAL LOW (ref 8.9–10.3)
GFR calc Af Amer: 53 mL/min — ABNORMAL LOW (ref >60)
GFR, EST NON AFRICAN AMERICAN: 45 mL/min — AB (ref >60)
GLUCOSE: 434 mg/dL — AB (ref 65–99)
POTASSIUM: 3.8 mmol/L (ref 3.5–5.1)
SODIUM: 138 mmol/L (ref 135–145)

## 2016-11-22 LAB — ETHANOL: Alcohol, Ethyl (B): <5 mg/dL (ref <5)

## 2016-11-22 LAB — CBG MONITORING, ED
GLUCOSE-CAPILLARY: 301 mg/dL — AB (ref 65–99)
Glucose-Capillary: 191 mg/dL — ABNORMAL HIGH (ref 65–99)

## 2016-11-22 LAB — ACETAMINOPHEN LEVEL: Acetaminophen (Tylenol), Serum: <10 ug/mL — ABNORMAL LOW (ref 10–30)

## 2016-11-22 LAB — I-STAT TROPONIN, ED: Troponin i, poc: 0 ng/mL (ref 0.00–0.08)

## 2016-11-22 LAB — SALICYLATE LEVEL: Salicylate Lvl: <7 mg/dL (ref 2.8–30.0)

## 2016-11-22 MED ORDER — ALBUTEROL SULFATE HFA 108 (90 BASE) MCG/ACT IN AERS
2.0000 | INHALATION_SPRAY | Freq: Four times a day (QID) | RESPIRATORY_TRACT | Status: DC | PRN
  Administered 2016-11-24: 2 via RESPIRATORY_TRACT
  Filled 2016-11-22: qty 6.7

## 2016-11-22 MED ORDER — CYCLOBENZAPRINE HCL 10 MG PO TABS
10.0000 mg | ORAL_TABLET | Freq: Three times a day (TID) | ORAL | Status: DC | PRN
  Administered 2016-11-22: 10 mg via ORAL
  Filled 2016-11-22: qty 1

## 2016-11-22 MED ORDER — LOSARTAN POTASSIUM 50 MG PO TABS
50.0000 mg | ORAL_TABLET | Freq: Every day | ORAL | Status: DC
  Administered 2016-11-22 – 2016-11-26 (×5): 50 mg via ORAL
  Filled 2016-11-22 (×5): qty 1

## 2016-11-22 MED ORDER — ISOSORBIDE MONONITRATE ER 30 MG PO TB24
30.0000 mg | ORAL_TABLET | Freq: Every day | ORAL | Status: DC
  Administered 2016-11-22 – 2016-11-26 (×5): 30 mg via ORAL
  Filled 2016-11-22 (×5): qty 1

## 2016-11-22 MED ORDER — RIVAROXABAN 15 MG PO TABS
15.0000 mg | ORAL_TABLET | Freq: Two times a day (BID) | ORAL | Status: DC
  Administered 2016-11-22 – 2016-11-25 (×6): 15 mg via ORAL
  Filled 2016-11-22 (×8): qty 1

## 2016-11-22 MED ORDER — ALLOPURINOL 100 MG PO TABS
200.0000 mg | ORAL_TABLET | Freq: Every day | ORAL | Status: DC
  Administered 2016-11-22 – 2016-11-26 (×5): 200 mg via ORAL
  Filled 2016-11-22 (×5): qty 2

## 2016-11-22 MED ORDER — INSULIN ASPART 100 UNIT/ML ~~LOC~~ SOLN: 2.0000 [IU] | Freq: Three times a day (TID) | SUBCUTANEOUS | Status: DC

## 2016-11-22 MED ORDER — ASPIRIN EC 81 MG PO TBEC
81.0000 mg | DELAYED_RELEASE_TABLET | Freq: Every day | ORAL | Status: DC
  Administered 2016-11-22 – 2016-11-26 (×5): 81 mg via ORAL
  Filled 2016-11-22 (×5): qty 1

## 2016-11-22 MED ORDER — BUPROPION HCL ER (XL) 150 MG PO TB24
150.0000 mg | ORAL_TABLET | Freq: Every day | ORAL | Status: DC
  Administered 2016-11-22 – 2016-11-26 (×5): 150 mg via ORAL
  Filled 2016-11-22 (×5): qty 1

## 2016-11-22 MED ORDER — METOPROLOL SUCCINATE ER 25 MG PO TB24
25.0000 mg | ORAL_TABLET | Freq: Every day | ORAL | Status: DC
  Administered 2016-11-22 – 2016-11-26 (×5): 25 mg via ORAL
  Filled 2016-11-22 (×5): qty 1

## 2016-11-22 MED ORDER — GLIPIZIDE ER 5 MG PO TB24
5.0000 mg | ORAL_TABLET | Freq: Every day | ORAL | Status: DC
  Administered 2016-11-23 – 2016-11-26 (×4): 5 mg via ORAL
  Filled 2016-11-22 (×6): qty 1

## 2016-11-22 MED ORDER — INSULIN ASPART 100 UNIT/ML ~~LOC~~ SOLN
4.0000 [IU] | Freq: Once | SUBCUTANEOUS | Status: AC
  Administered 2016-11-22: 4 [IU] via INTRAVENOUS
  Filled 2016-11-22: qty 1

## 2016-11-22 MED ORDER — MIRTAZAPINE 7.5 MG PO TABS
7.5000 mg | ORAL_TABLET | Freq: Every day | ORAL | Status: DC
  Administered 2016-11-22 – 2016-11-25 (×4): 7.5 mg via ORAL
  Filled 2016-11-22 (×4): qty 1

## 2016-11-22 MED ORDER — CLOPIDOGREL BISULFATE 75 MG PO TABS: 75.0000 mg | ORAL_TABLET | Freq: Every day | ORAL | Status: DC

## 2016-11-22 MED ORDER — DILTIAZEM HCL ER COATED BEADS 120 MG PO CP24
120.0000 mg | ORAL_CAPSULE | Freq: Every day | ORAL | Status: DC
  Administered 2016-11-22 – 2016-11-26 (×5): 120 mg via ORAL
  Filled 2016-11-22 (×5): qty 1

## 2016-11-22 MED ORDER — FAMOTIDINE 20 MG PO TABS
20.0000 mg | ORAL_TABLET | Freq: Every day | ORAL | Status: DC
  Administered 2016-11-22 – 2016-11-26 (×5): 20 mg via ORAL
  Filled 2016-11-22 (×5): qty 1

## 2016-11-22 MED ORDER — GABAPENTIN 400 MG PO CAPS
400.0000 mg | ORAL_CAPSULE | Freq: Three times a day (TID) | ORAL | Status: DC
  Administered 2016-11-22 – 2016-11-26 (×12): 400 mg via ORAL
  Filled 2016-11-22 (×12): qty 1

## 2016-11-22 MED ORDER — SODIUM CHLORIDE 0.9 % IV BOLUS (SEPSIS)
500.0000 mL | Freq: Once | INTRAVENOUS | Status: AC
  Administered 2016-11-22: 500 mL via INTRAVENOUS

## 2016-11-22 MED ORDER — METFORMIN HCL 500 MG PO TABS
500.0000 mg | ORAL_TABLET | Freq: Two times a day (BID) | ORAL | Status: DC
  Administered 2016-11-22 – 2016-11-26 (×8): 500 mg via ORAL
  Filled 2016-11-22 (×8): qty 1

## 2016-11-22 MED ORDER — PRAVASTATIN SODIUM 40 MG PO TABS
40.0000 mg | ORAL_TABLET | Freq: Every day | ORAL | Status: DC
  Administered 2016-11-22 – 2016-11-25 (×4): 40 mg via ORAL
  Filled 2016-11-22 (×4): qty 1

## 2016-11-22 MED ORDER — AMANTADINE HCL 100 MG PO CAPS
100.0000 mg | ORAL_CAPSULE | Freq: Every day | ORAL | Status: DC
  Administered 2016-11-22 – 2016-11-26 (×5): 100 mg via ORAL
  Filled 2016-11-22 (×5): qty 1

## 2016-11-22 MED ORDER — INSULIN GLARGINE 100 UNIT/ML ~~LOC~~ SOLN
25.0000 [IU] | Freq: Every day | SUBCUTANEOUS | Status: DC
  Administered 2016-11-22 – 2016-11-25 (×4): 25 [IU] via SUBCUTANEOUS
  Filled 2016-11-22 (×6): qty 0.25

## 2016-11-22 MED ORDER — EMTRICITAB-RILPIVIR-TENOFOV AF 200-25-25 MG PO TABS
1.0000 | ORAL_TABLET | Freq: Every day | ORAL | Status: DC
  Administered 2016-11-22 – 2016-11-26 (×5): 1 via ORAL
  Filled 2016-11-22 (×8): qty 1

## 2016-11-22 MED ORDER — RISPERIDONE 1 MG PO TABS
1.0000 mg | ORAL_TABLET | Freq: Every day | ORAL | Status: DC
  Administered 2016-11-22 – 2016-11-24 (×3): 1 mg via ORAL
  Filled 2016-11-22 (×3): qty 1

## 2016-11-22 NOTE — ED Notes (Signed)
TTS consult in process at bedside at this time.

## 2016-11-22 NOTE — ED Triage Notes (Addendum)
Pt reports he has been feeling SOB. Pt reports he was told he has blood clots in his lungs 2 weeks ago at [facility]. Pt reports he is not on blood thinners. Upon asking pt reports he is having CP, lightheadedness, and dizziness.  Pt also reports he is SI, and has tried kill himself 2x in the last 30 days (cut wrists and stepped in front of car). Pt uses cocaine, last use 2 days ago. No HI.

## 2016-11-22 NOTE — BH Assessment (Addendum)
Tele Assessment Note   Michael [name] is an 66 y.o. male, who presents voluntarily and unaccompanied to [facility]. Pt reported, he is currently experiencing suicidal thoughts. Pt reported, two previous suicide attempts in November/December 2017 where he walked in front of a car and cut his wrist with a knife. Pt reported, there was not blood drawn because the knife was dull. Pt reported, his plan is to walk into traffic and/or cut his wrist. Pt reported, seeing shadows, seeing people walk around rooms in his house and heaving voices telling him he is "no good." Pt reported, he has been depressed, having suicidal thoughts and hallucinating since he was 66 years old. Pt reported, while at [facility] he was diagnosed with blood clots and is having difficulty breathing. Pt reported experiencing the following depressive/anxiety symptoms: tearfulness, excessive guilt, feeling hopeless/worthless, isolating, nervousness, excessive worrying, and irritability. Pt denied HI and self-injurious behaviors outside of suicide attempt.   Pt denied verbal, physical and sexual abuse. Pt reported, using $1300-$1400 worth of crack cocaine since last Thursday (11/14/2016) to forget about the depression, suicidal thoughts and hallucinations. Pt denied being linked to OPT resources (medication management and/or counseling.)  Pt reported, a previous inpatient admission in Novemeber/December 2017 at [facility].   Pt presented quiet/awake in scrubs with soft speech. Pt's eye contact was good. Pt's mood was depressed/anxious. Pt's affect was congruent with mood. Pt's thought process was coherent relevant. Pt's judgement was unimpaired. Pt's concentration was normal. Pt's insight was fair. Pt's impulse control was poor. Pt's was oriented x4 (date, year, city and state.) Pt reported if discharged from [facility] he could not contract for safety. Pt reported, if inpatient treatment was recommended he would sign in voluntarily.    Diagnosis: Major Depressive Disorder, Recurrent, Severe with Psychotic Features                   Cocaine Use Disorder, Severe  Past Medical History:  Past Medical History:  Diagnosis Date  . A-fib ([place])   . Asthma    No PFTs, history of childhood asthma  . CAD (coronary artery disease)    a. 06/2013 STEMI/PCI ([facility]): LAD w/ thrombus (treated with BMS), mid 75%, D2 75%; LCX OM2 75%; RCA small, PDA 95%, PLV 95%;  b. 10/2013 Cath/PCI: ISR w/in LAD (Promus DES x 2), borderline OM2 lesion;  c. 01/2014 MV: Intermediate risk, medium-sized distal ant wall infarct w/ very small amt of peri-infarct ischemia. EF 60%.  . Cellulitis 04/2014   left facial  . Chondromalacia of medial femoral condyle    Left knee MRI 04/28/12: Chondromalacia of the medial femoral condyle with slight peripheral degeneration of the meniscocapsular junction of the medial meniscus; followed by sports medicine  . Collagen vascular disease ([place])   . Crack cocaine use    for 20+ years, has been enrolled in detox programs in the past  . Depression    with history of hospitalization for suicidal ideation  . Diabetes mellitus 2002   Diagnosed in 2002, started insulin in 2012  . Gout   . Gout 04/28/2012  . Headache(784.0)    CT head 08/2011: Periventricular and subcortical white matter hypodensities are most in keeping with chronic microangiopathic change  . HIV infection [facility]) Nov 2012   Followed by [name]  . Hyperlipidemia   . Hypertension   . Pulmonary embolism [facility])     Past Surgical History:  Procedure Laterality Date  . BACK SURGERY     1988  .  BOWEL RESECTION    . CARDIAC SURGERY    . CERVICAL SPINE SURGERY     " rods in my neck "  . CORONARY STENT PLACEMENT    . NM MYOCAR PERF WALL MOTION  12/27/2011   normal  . SPINE SURGERY      Family History:  Family History  Problem Relation Age of Onset  . Diabetes Mother   . Hypertension Mother   . Hyperlipidemia Mother   . Diabetes Father   . Cancer  Father   . Hypertension Father   . Diabetes Brother   . Heart disease Brother   . Diabetes Sister   . Colon cancer Neg Hx     Social History:  reports that he has never smoked. He has never used smokeless tobacco. He reports that he uses drugs, including "Crack" cocaine and Cocaine. He reports that he does not drink alcohol.  Additional Social History:  Alcohol / Drug Use Pain Medications: See MAR Prescriptions: See MAR Over the Counter: See MAR History of alcohol / drug use?: Yes Substance #1 Name of Substance 1: Crack Cocaine 1 - Age of First Use: UTA 1 - Amount (size/oz): Pt reported, since last Thursday he has smoked $1300-$1400 work of crack.  1 - Frequency: UTA 1 - Duration: UTA 1 - Last Use / Amount: Pt reported, a day and a half ago.   CIWA: CIWA-Ar BP: 122/84 Pulse Rate: 85 COWS:    PATIENT STRENGTHS: (choose at least two) Average or above average intelligence Motivation for treatment/growth  Allergies: No Known Allergies  Home Medications:  (Not in a hospital admission)  OB/GYN Status:  No LMP for male patient.  General Assessment Data Location of Assessment: [facility] TTS Assessment: In system Is this a Tele or Face-to-Face Assessment?: Face-to-Face Is this an Initial Assessment or a Re-assessment for this encounter?: Initial Assessment Marital status: Divorced Is patient pregnant?: No Pregnancy Status: No Living Arrangements: Alone Can pt return to current living arrangement?: Yes Admission Status: Voluntary Is patient capable of signing voluntary admission?: Yes Referral Source: Self/Family/Friend Insurance type: [employer]     Crisis Care Plan Living Arrangements: Alone Legal Guardian: Other: (Self) Name of Psychiatrist: NA Name of Therapist: NA  Education Status Is patient currently in school?: No Current Grade: NA Highest grade of school patient has completed: 12th grade Name of school: NA Contact person: NA  Risk to self  with the past 6 months Suicidal Ideation: Yes-Currently Present Has patient been a risk to self within the past 6 months prior to admission? : Yes Suicidal Intent: Yes-Currently Present Has patient had any suicidal intent within the past 6 months prior to admission? : Yes Is patient at risk for suicide?: Yes Suicidal Plan?: Yes-Currently Present Has patient had any suicidal plan within the past 6 months prior to admission? : Yes Specify Current Suicidal Plan: Pt reported, walking in traffic and cutting his wrist.  Access to Means: Yes Specify Access to Suicidal Means: Pt has access to knives and is able to walk into traffic.  What has been your use of drugs/alcohol within the last 12 months?: Crack cocaine Previous Attempts/Gestures: Yes How many times?: 2 Other Self Harm Risks: cutting Triggers for Past Attempts: Unpredictable Intentional Self Injurious Behavior: Cutting Comment - Self Injurious Behavior: Pt reported cutting his wrisit as a suicide attempt.  Family Suicide History: No Recent stressful life event(s): Other (Comment) (increased depression, suicidal thoughts and hallucinations) Persecutory voices/beliefs?: Yes Depression: Yes Depression Symptoms: Tearfulness,  Isolating, Guilt, Feeling worthless/self pity, Feeling angry/irritable Substance abuse history and/or treatment for substance abuse?: Yes Suicide prevention information given to non-admitted patients: Not applicable  Risk to Others within the past 6 months Homicidal Ideation: No Does patient have any lifetime risk of violence toward others beyond the six months prior to admission? : No Thoughts of Harm to Others: No Current Homicidal Intent: No Current Homicidal Plan: No Access to Homicidal Means: No Identified Victim: NA History of harm to others?: No Assessment of Violence: None Noted Violent Behavior Description: NA Does patient have access to weapons?: Yes (Comment) (knives) Criminal Charges Pending?:  No Does patient have a court date: No Is patient on probation?: No  Psychosis Hallucinations: Auditory, Visual Delusions: None noted  Mental Status Report Appearance/Hygiene: In scrubs Eye Contact: Good Motor Activity: Unremarkable Speech: Soft Level of Consciousness: Quiet/awake Mood: Depressed, Anxious Affect: Other (Comment) (congruent with mood. ) Anxiety Level: Minimal Thought Processes: Coherent, Relevant Judgement: Unimpaired Orientation: Other (Comment) (date, year, city and state) Obsessive Compulsive Thoughts/Behaviors: None  Cognitive Functioning Concentration: Normal Memory: Recent Intact IQ: Average Insight: Fair Impulse Control: Poor Appetite: Fair Weight Loss: 0 Weight Gain: 0 Sleep: Decreased Total Hours of Sleep: 3 Vegetative Symptoms: None  ADLScreening [facility] Assessment Services) Patient's cognitive ability adequate to safely complete daily activities?: Yes Patient able to express need for assistance with ADLs?: Yes Independently performs ADLs?: No  Prior Inpatient Therapy Prior Inpatient Therapy: Yes Prior Therapy Dates: 2017 Prior Therapy Facilty/Provider(s): [facility] Reason for Treatment: suicide attempts.   Prior Outpatient Therapy Prior Outpatient Therapy: No Prior Therapy Dates: Na Prior Therapy Facilty/Provider(s): NA Reason for Treatment: NA Does patient have an ACCT team?: No Does patient have Intensive In-House Services?  : No Does patient have [facility] services? : No Does patient have P4CC services?: No  ADL Screening (condition at time of admission) Patient's cognitive ability adequate to safely complete daily activities?: Yes Is the patient deaf or have difficulty hearing?: No Does the patient have difficulty seeing, even when wearing glasses/contacts?: Yes (Pt reported, needing glasses. ) Does the patient have difficulty concentrating, remembering, or making decisions?: Yes Patient able to express need for assistance with  ADLs?: Yes Does the patient have difficulty dressing or bathing?: Yes Independently performs ADLs?: No Communication: Independent Dressing (OT): Independent Grooming: Independent Feeding: Independent Bathing: Independent Toileting: Independent In/Out Bed: Independent Walks in Home: Needs assistance Is this a change from baseline?: Pre-admission baseline (Pt reported, having surgey and at times falls so he uses a cane. ) Does the patient have difficulty walking or climbing stairs?: No Weakness of Legs: Left Weakness of Arms/Hands: None       Abuse/Neglect Assessment (Assessment to be complete while patient is alone) Physical Abuse: Denies (Pt denies. ) Verbal Abuse: Denies (Pt denies. ) Sexual Abuse: Denies (Pt denies. )     Advance Directives (For Healthcare) Does Patient Have a Medical Advance Directive?: No    Additional Information 1:1 In Past 12 Months?: No CIRT Risk: No Elopement Risk: No Does patient have medical clearance?: Yes     Disposition: Lindon Romp, Np recommends gero-psych inpatient treatment. Disposition discussed with Dr. Kathrynn Humble and Bethena Roys, RN. Per [place], [facility] no appropiate beds available. TTS to seek placement.   Disposition Initial Assessment Completed for this Encounter: Yes Disposition of Patient: Other dispositions (Pending NP review. ) Other disposition(s): Other (Comment) (Pending NP review. )  Edd Fabian 11/22/2016 7:53 PM   Edd Fabian, MS, [facility], [facility] Triage Specialist [phone]

## 2016-11-22 NOTE — ED Notes (Signed)
SBAR Report received from previous nurse. Pt received calm and visible on unit. Pt endorses current SI, V H, depression, anxiety, and rates pain 5/10 at this time, and appears otherwise stable and free of distress, pt denies HI, and AH. Pt reminded of camera surveillance, q 15 min rounds, and rules of the milieu. Will continue to assess.

## 2016-11-22 NOTE — ED Notes (Signed)
Bed: [facility] Expected date:  Expected time:  Means of arrival:  Comments: Ouida Sills

## 2016-11-22 NOTE — ED Provider Notes (Signed)
[place] DEPT Provider Note   CSN: [id number] Arrival date & time: 11/22/16  1223     History   Chief Complaint Chief Complaint  Patient presents with  . Shortness of Breath  . Suicidal    HPI Michael [name] is a 66 y.o. male.  HPI Patient presents suicidal and some shortness of breath. Around 2 weeks ago was diagnosed at [facility] with pulmonary embolism was. Also depressed and suicidal that time. Since discharge has not filled up his prescription for Xarelto. Has had some cough. Some dull chest pain.  Also has used cocaine. States his use $1300 cocaine in the last week. He is depressed and suicidal. Has reportedly had 2 suicide attempts in the last month. Recently got out of behavioral health and no vomiting. States he is still feeling bad.    Past Medical History:  Diagnosis Date  . A-fib ([place])   . Asthma    No PFTs, history of childhood asthma  . CAD (coronary artery disease)    a. 06/2013 STEMI/PCI ([facility]): LAD w/ thrombus (treated with BMS), mid 75%, D2 75%; LCX OM2 75%; RCA small, PDA 95%, PLV 95%;  b. 10/2013 Cath/PCI: ISR w/in LAD (Promus DES x 2), borderline OM2 lesion;  c. 01/2014 MV: Intermediate risk, medium-sized distal ant wall infarct w/ very small amt of peri-infarct ischemia. EF 60%.  . Cellulitis 04/2014   left facial  . Chondromalacia of medial femoral condyle    Left knee MRI 04/28/12: Chondromalacia of the medial femoral condyle with slight peripheral degeneration of the meniscocapsular junction of the medial meniscus; followed by sports medicine  . Collagen vascular disease ([place])   . Crack cocaine use    for 20+ years, has been enrolled in detox programs in the past  . Depression    with history of hospitalization for suicidal ideation  . Diabetes mellitus 2002   Diagnosed in 2002, started insulin in 2012  . Gout   . Gout 04/28/2012  . Headache(784.0)    CT head 08/2011: Periventricular and subcortical white matter hypodensities are most in  keeping with chronic microangiopathic change  . HIV infection [facility]) Nov 2012   Followed by [name]  . Hyperlipidemia   . Hypertension   . Pulmonary embolism [facility])     Patient Active Problem List   Diagnosis Date Noted  . Back pain 04/18/2016  . S/P carotid endarterectomy 11/15/2015  . MDD (major depressive disorder), recurrent severe, without psychosis ([place]) 09/09/2015  . Cocaine dependence with cocaine-induced mood disorder ([place])   . Cocaine-induced mood disorder ([place]) 08/14/2015  . Gout 07/10/2015  . Cough 04/26/2015  . Lactic acidosis 03/06/2015  . CKD (chronic kidney disease) stage 3, GFR 30-59 ml/min 03/06/2015  . Normocytic anemia 03/06/2015  . Hypoglycemia   . Encounter for general adult medical examination with abnormal findings 02/09/2015  . Cocaine use disorder, severe, dependence ([place]) 12/13/2014  . Substance or medication-induced depressive disorder with onset during withdrawal ([place]) 12/13/2014  . Cervicalgia 06/28/2014  . Lumbar radiculopathy, chronic 06/28/2014  . Asthma, chronic 02/03/2014  . 3-vessel CAD 06/24/2013  . ED (erectile dysfunction) of organic origin 07/07/2012  . Hypertension goal BP (blood pressure) < 140/80 04/29/2012  . Chondromalacia of left knee 03/19/2012  . Hyperlipidemia with target LDL less than 100 02/12/2012  . Fibromyalgia 02/12/2012  . HIV (human immunodeficiency virus infection) ([place]) 08/27/2011  . Uncontrolled type 2 diabetes with neuropathy ([place]) 10/17/2000    Past Surgical History:  Procedure Laterality Date  .  BACK SURGERY     1988  . BOWEL RESECTION    . CARDIAC SURGERY    . CERVICAL SPINE SURGERY     " rods in my neck "  . CORONARY STENT PLACEMENT    . NM MYOCAR PERF WALL MOTION  12/27/2011   normal  . SPINE SURGERY         Home Medications    Prior to Admission medications   Medication Sig Start Date End Date Taking? Authorizing Provider  albuterol (PROAIR HFA) 108 (90 Base) MCG/ACT inhaler Inhale 2 puffs  into the lungs every 6 (six) hours as needed for wheezing or shortness of breath. 04/10/16  Yes Norman Herrlich, MD  allopurinol (ZYLOPRIM) 100 MG tablet Take 200 mg by mouth daily.   Yes Historical Provider, MD  amantadine (SYMMETREL) 100 MG capsule Take 100 mg by mouth daily.    Yes Historical Provider, MD  aspirin EC 81 MG tablet Take 81 mg by mouth daily.   Yes Historical Provider, MD  buPROPion (WELLBUTRIN XL) 150 MG 24 hr tablet Take 150 mg by mouth daily.   Yes Historical Provider, MD  clopidogrel (PLAVIX) 75 MG tablet Take 75 mg by mouth daily.   Yes Historical Provider, MD  cyclobenzaprine (FLEXERIL) 10 MG tablet Take 10 mg by mouth 3 (three) times daily as needed for muscle spasms.   Yes Historical Provider, MD  diclofenac sodium (VOLTAREN) 1 % GEL Apply 2 g topically 4 (four) times daily as needed (for pain).   Yes Historical Provider, MD  diltiazem (CARDIZEM CD) 120 MG 24 hr capsule Take 1 capsule (120 mg total) by mouth daily. 04/10/16  Yes Norman Herrlich, MD  emtricitabine-rilpivir-tenofovir DF (COMPLERA) 200-25-300 MG tablet Take 1 tablet by mouth daily. 04/10/16  Yes Norman Herrlich, MD  famotidine (PEPCID) 20 MG tablet Take 20 mg by mouth daily.   Yes Historical Provider, MD  gabapentin (NEURONTIN) 400 MG capsule Take 400 mg by mouth 3 (three) times daily.   Yes Historical Provider, MD  glipiZIDE (GLUCOTROL XL) 5 MG 24 hr tablet Take 5 mg by mouth daily with breakfast.   Yes Historical Provider, MD  insulin aspart (NOVOLOG) 100 UNIT/ML injection Inject 2-4 Units into the skin 3 (three) times daily before meals. Pt uses per sliding scale.   Yes Historical Provider, MD  insulin glargine (LANTUS) 100 unit/mL SOPN Inject 25 Units into the skin at bedtime.   Yes Historical Provider, MD  isosorbide mononitrate (IMDUR) 30 MG 24 hr tablet Take 1 tablet (30 mg total) by mouth daily. 04/10/16  Yes Norman Herrlich, MD  losartan (COZAAR) 50 MG tablet Take 1 tablet (50 mg total) by mouth daily. 04/10/16  Yes  Norman Herrlich, MD  metFORMIN (GLUCOPHAGE) 500 MG tablet Take 500 mg by mouth 2 (two) times daily before a meal.   Yes Historical Provider, MD  metoprolol succinate (TOPROL-XL) 25 MG 24 hr tablet Take 1 tablet (25 mg total) by mouth daily. 04/10/16  Yes Norman Herrlich, MD  mirtazapine (REMERON) 7.5 MG tablet Take 7.5 mg by mouth at bedtime.   Yes Historical Provider, MD  pravastatin (PRAVACHOL) 40 MG tablet Take 40 mg by mouth at bedtime.   Yes Historical Provider, MD  risperiDONE (RISPERDAL) 1 MG tablet Take 1 tablet (1 mg total) by mouth at bedtime. 04/10/16  Yes Norman Herrlich, MD    Family History Family History  Problem Relation Age of Onset  . Diabetes Mother   .  Hypertension Mother   . Hyperlipidemia Mother   . Diabetes Father   . Cancer Father   . Hypertension Father   . Diabetes Brother   . Heart disease Brother   . Diabetes Sister   . Colon cancer Neg Hx     Social History Social History  Substance Use Topics  . Smoking status: Never Smoker  . Smokeless tobacco: Never Used  . Alcohol use No     Allergies   Patient has no known allergies.   Review of Systems Review of Systems  Constitutional: Negative for appetite change.  HENT: Positive for congestion.   Eyes: Negative for redness.  Respiratory: Positive for cough and shortness of breath.   Cardiovascular: Positive for chest pain.  Gastrointestinal: Negative for abdominal pain.  Genitourinary: Negative for enuresis.  Musculoskeletal: Negative for back pain.  Neurological: Negative for numbness.  Psychiatric/Behavioral: Positive for dysphoric mood and suicidal ideas.     Physical Exam Updated Vital Signs BP 131/77 (BP Location: Left Arm)   Pulse 93   Temp 98.1 F (36.7 C) (Oral)   Resp 22   Ht 5\' 7"  (1.702 m)   Wt 174 lb (78.9 kg)   SpO2 98%   BMI 27.25 kg/m   Physical Exam  Constitutional: He appears well-developed.  HENT:  Head: Normocephalic.  Eyes: Pupils are equal, round, and reactive to  light.  Neck: Neck supple.  Cardiovascular: Normal rate.   Pulmonary/Chest:  Mildly harsh breath sounds without respiratory distress.  Abdominal: Soft.  Musculoskeletal: He exhibits no edema.  Neurological: He is alert.  Skin: Skin is warm.  Psychiatric: He has a normal mood and affect.     ED Treatments / Results  Labs (all labs ordered are listed, but only abnormal results are displayed) Labs Reviewed  BASIC METABOLIC PANEL - Abnormal; Notable for the following:       Result Value   Glucose, Bld 434 (*)    Creatinine, Ser 1.54 (*)    Calcium 8.4 (*)    GFR calc non Af Amer 45 (*)    GFR calc Af Amer 53 (*)    All other components within normal limits  CBC - Abnormal; Notable for the following:    RBC 3.87 (*)    Hemoglobin 10.2 (*)    HCT 31.2 (*)    All other components within normal limits  ACETAMINOPHEN LEVEL - Abnormal; Notable for the following:    Acetaminophen (Tylenol), Serum <10 (*)    All other components within normal limits  RAPID URINE DRUG SCREEN, HOSP PERFORMED - Abnormal; Notable for the following:    Cocaine POSITIVE (*)    All other components within normal limits  ETHANOL  SALICYLATE LEVEL  I-STAT TROPOININ, ED  CBG MONITORING, ED    EKG  EKG Interpretation  Date/Time:  Friday November 22 2016 12:32:19 EST Ventricular Rate:  96 PR Interval:    QRS Duration: 75 QT Interval:  330 QTC Calculation: 417 R Axis:   57 Text Interpretation:  Fast sinus arrhythmia Abnormal R-wave progression, early transition Nonspecific T abnormalities, lateral leads Confirmed by Alvino Chapel  MD, Verniece Encarnacion [phone]) on 11/22/2016 1:14:03 PM       Radiology Dg Chest 2 View  Result Date: 11/22/2016 CLINICAL DATA:  Shortness of breath. EXAM: CHEST  2 VIEW COMPARISON:  12/15/2015. FINDINGS: Mediastinum and hilar structures normal. Lungs are clear. Heart size normal. No pleural effusion or pneumothorax. Prior cervicothoracic spine fusion . IMPRESSION: No acute  cardiopulmonary disease. Prior cervicothoracic  spine fusion. Electronically Signed   By: Marcello Moores  Register   On: 11/22/2016 13:42    Procedures Procedures (including critical care time)  Medications Ordered in ED Medications  sodium chloride 0.9 % bolus 500 mL (not administered)  insulin aspart (novoLOG) injection 4 Units (not administered)     Initial Impression / Assessment and Plan / ED Course  I have reviewed the triage vital signs and the nursing notes.  Pertinent labs & imaging results that were available during my care of the patient were reviewed by me and considered in my medical decision making (see chart for details).   patient with depression and suicidal thoughts. Has recently been treated for the same. Also cocaine abuse. Found to be hyperglycemic. Not in DKA. Will treat and reevaluate. Also has recent diagnosis of pulmonary embolism. His been off his medications. Labs today are reassuring for that. Will start back on his Xarelto which she has not been taking. Medically cleared from a pulmonary embolism. After hyperglycemia has improved he will be cleared for behavioral health  Final Clinical Impressions(s) / ED Diagnoses   Final diagnoses:  Suicidal ideation  Cocaine abuse  Hyperglycemia  Other pulmonary embolism without acute cor pulmonale, unspecified chronicity ([name])    New Prescriptions New Prescriptions   No medications on file     Davonna Belling, MD 11/22/16 1542

## 2016-11-22 NOTE — ED Notes (Signed)
3 bags of pt belongings placed in the cabinets at the nurse's station in the 9-12 section.

## 2016-11-22 NOTE — ED Notes (Signed)
EDP ([place]) notified and orders to give lantus as ordered and recheck CBG in 6 hours (3AM)

## 2016-11-22 NOTE — ED Provider Notes (Signed)
  Physical Exam  BP 131/77 (BP Location: Left Arm)   Pulse 93   Temp 98.1 F (36.7 C) (Oral)   Resp 22   Ht 5\' 7"  (1.702 m)   Wt 174 lb (78.9 kg)   SpO2 98%   BMI 27.25 kg/m   Physical Exam  ED Course  Procedures  MDM  Assuming care of patient from Dr. Alvino Chapel.   Patient in the ED for suicidal ideation and DIB. Workup thus far shows hyperglycemia. CXr is normal. Respiratroy wise he is cleared.  Concerning findings are as following elevated  Important pending results are repeat blood sugar - goal would be to get him < 300 so that psych facility is able to take him  According to Dr. Alvino Chapel, plan is to clear for psych when sugar has improved.   Patient had no complains, no concerns from the nursing side. Will continue to monitor.    EKG Interpretation  Date/Time:  Friday November 22 2016 12:32:19 EST Ventricular Rate:  96 PR Interval:    QRS Duration: 75 QT Interval:  330 QTC Calculation: 417 R Axis:   57 Text Interpretation:  Fast sinus arrhythmia Abnormal R-wave progression, early transition Nonspecific T abnormalities, lateral leads Confirmed by Alvino Chapel  MD, NATHAN [phone]) on 11/22/2016 1:14:03 PM             Varney Biles, MD 11/22/16 1621

## 2016-11-23 DIAGNOSIS — B20 Human immunodeficiency virus [HIV] disease: Secondary | ICD-10-CM

## 2016-11-23 DIAGNOSIS — F1490 Cocaine use, unspecified, uncomplicated: Secondary | ICD-10-CM

## 2016-11-23 DIAGNOSIS — E119 Type 2 diabetes mellitus without complications: Secondary | ICD-10-CM

## 2016-11-23 DIAGNOSIS — F1994 Other psychoactive substance use, unspecified with psychoactive substance-induced mood disorder: Secondary | ICD-10-CM

## 2016-11-23 DIAGNOSIS — Z794 Long term (current) use of insulin: Secondary | ICD-10-CM

## 2016-11-23 DIAGNOSIS — Z79899 Other long term (current) drug therapy: Secondary | ICD-10-CM

## 2016-11-23 LAB — CBG MONITORING, ED
GLUCOSE-CAPILLARY: 192 mg/dL — AB (ref 65–99)
Glucose-Capillary: 247 mg/dL — ABNORMAL HIGH (ref 65–99)
Glucose-Capillary: 199 mg/dL — ABNORMAL HIGH (ref 65–99)
Glucose-Capillary: 285 mg/dL — ABNORMAL HIGH (ref 65–99)
Glucose-Capillary: 118 mg/dL — ABNORMAL HIGH (ref 65–99)

## 2016-11-23 MED ORDER — INSULIN ASPART 100 UNIT/ML ~~LOC~~ SOLN
3.0000 [IU] | Freq: Three times a day (TID) | SUBCUTANEOUS | Status: DC
  Administered 2016-11-23 – 2016-11-24 (×5): 3 [IU] via SUBCUTANEOUS
  Administered 2016-11-24: 100 [IU] via SUBCUTANEOUS
  Administered 2016-11-25 – 2016-11-26 (×3): 3 [IU] via SUBCUTANEOUS
  Filled 2016-11-23 (×7): qty 1

## 2016-11-23 MED ORDER — INSULIN ASPART 100 UNIT/ML ~~LOC~~ SOLN: 3.0000 [IU] | Freq: Three times a day (TID) | SUBCUTANEOUS | Status: DC

## 2016-11-23 NOTE — ED Notes (Signed)
During 15 minute checks pt. has been conversing with this staff member and in a pleasant mood. Lunch as provided in which pt. Eat 100% of his lunch. RN has been made aware. This Probation officer and will continue to monitor pt closely and keep a check on his vitals.

## 2016-11-23 NOTE — ED Notes (Signed)
This nurse and Judy,RN in pt room for EKG

## 2016-11-23 NOTE — ED Notes (Signed)
This nurse notified EDP of pt apical pulse. Reports he will be on unit to assess pt.

## 2016-11-23 NOTE — ED Notes (Signed)
SBAR Report received from previous nurse. Pt received calm and visible on unit. Pt denies current SI/ HI, A/V H, depression, anxiety, or pain at this time, and appears otherwise stable and free of distress. Pt reminded of camera surveillance, q 15 min rounds, and rules of the milieu. Will continue to assess. 

## 2016-11-23 NOTE — ED Notes (Addendum)
Pt c/o "feeling tight." when he breathes. This nurse notified EDP Dr. Vanita Panda.

## 2016-11-23 NOTE — ED Notes (Signed)
No s/s of distress noted at this time. Manual pulse obtained 64, Spo2 100%RA. EDP made aware. Will continue to monitor.

## 2016-11-23 NOTE — Consult Note (Addendum)
[facility] Consult   Reason for Consult:  Psychiatric consultation Referring Physician:  EDP Patient Identification: Michael [name] MRN:  [id number] Principal Diagnosis: Substance induced mood disorder [facility]) Diagnosis:   Patient Active Problem List   Diagnosis Date Noted  . Substance induced mood disorder ([place]) [F19.94] 11/23/2016  . Back pain [M54.9] 04/18/2016  . S/P carotid endarterectomy [Z98.890] 11/15/2015  . MDD (major depressive disorder), recurrent severe, without psychosis ([place]) [F33.2] 09/09/2015  . Cocaine dependence with cocaine-induced mood disorder ([place]) [F14.24]   . Cocaine-induced mood disorder ([place]) [F14.94] 08/14/2015  . Gout [M10.9] 07/10/2015  . Cough [R05] 04/26/2015  . Lactic acidosis [E87.2] 03/06/2015  . CKD (chronic kidney disease) stage 3, GFR 30-59 ml/min [N18.3] 03/06/2015  . Normocytic anemia [D64.9] 03/06/2015  . Hypoglycemia [E16.2]   . Encounter for general adult medical examination with abnormal findings [Z00.01] 02/09/2015  . Cocaine use disorder, severe, dependence ([place]) [F14.20] 12/13/2014  . Substance or medication-induced depressive disorder with onset during withdrawal ([place]) [F19.94] 12/13/2014  . Cervicalgia [M54.2] 06/28/2014  . Lumbar radiculopathy, chronic [M54.16] 06/28/2014  . Asthma, chronic [J45.909] 02/03/2014  . 3-vessel CAD [I25.10] 06/24/2013  . ED (erectile dysfunction) of organic origin [N52.9] 07/07/2012  . Hypertension goal BP (blood pressure) < 140/80 [I10] 04/29/2012  . Chondromalacia of left knee [M94.262] 03/19/2012  . Hyperlipidemia with target LDL less than 100 [E78.5] 02/12/2012  . Fibromyalgia [M79.7] 02/12/2012  . HIV (human immunodeficiency virus infection) ([place]) [B20] 08/27/2011  . Uncontrolled type 2 diabetes with neuropathy (Michael [name]) [E11.40, E11.65] 10/17/2000    Total Time spent with patient: 30 minutes  Subjective:   Michael [name] is a 66 y.o. male patient who states "I used cocaine;  spent over $1300"  HPI:  Per behavioral health therapeutic triage assessment, Michael [name] is an 66 y.o. male, who presents voluntarily and unaccompanied to [facility]. Pt reported, he is currently experiencing suicidal thoughts. Pt reported, two previous suicide attempts in November/December 2017 where he walked in front of a car and cut his wrist with a knife. Pt reported, there was not blood drawn because the knife was dull. Pt reported, his plan is to walk into traffic and/or cut his wrist. Pt reported, seeing shadows, seeing people walk around rooms in his house and heaving voices telling him he is "no good." Pt reported, he has been depressed, having suicidal thoughts and hallucinating since he was 66 years old. Pt reported, while at [facility] he was diagnosed with blood clots and is having difficulty breathing. Pt reported experiencing the following depressive/anxiety symptoms: tearfulness, excessive guilt, feeling hopeless/worthless, isolating, nervousness, excessive worrying, and irritability. Pt denied HI and self-injurious behaviors outside of suicide attempt. Pt denied verbal, physical and sexual abuse. Pt reported, using $1300-$1400 worth of crack cocaine since last Thursday (11/14/2016) to forget about the depression, suicidal thoughts and hallucinations. Pt denied being linked to OPT resources (medication management and/or counseling.) Pt reported, a previous inpatient admission in Novemeber/December 2017 at [facility]. Pt presented quiet/awake in scrubs with soft speech. Pt's eye contact was good. Pt's mood was depressed/anxious. Pt's affect was congruent with mood. Pt's thought process was coherent relevant. Pt's judgement was unimpaired. Pt's concentration was normal. Pt's insight was fair. Pt's impulse control was poor. Pt's was oriented x4 (date, year, city and state.) Pt reported if discharged from [facility] he could not contract for safety. Pt reported, if inpatient treatment was  recommended he would sign in voluntarily.   [facility] evaluation: Chart and nursing notes  reviewed. Patient seen face-to-face today with Dr. Louretta Shorten. atient is alert and oriented 4. He is calm and cooperative. The pt states he went into his savings and spent $1300 on cocaine. He states "been having a lot of problems and feeling suicidal." He is unable to recall the names of his psychiatric meds but states he has not been taking them as prescribed. He reports 2 prior suicide attempts, November, 2017 via stepping in front of a car and September, 2017 "tried to cut myself." He states he lives alone, denies access to weapons. Today, he continues to endorse suicidal ideation. He denies homicidal ideation, intent or plan. He states he had auditory and visual hallucinations about 2 weeks ago but could not remember if the hallucinations occurred during substance use.  Past Psychiatric History: depression, Cocaine abuse  Risk to Self: Suicidal Ideation: Yes-Currently Present Suicidal Intent: Yes-Currently Present Is patient at risk for suicide?: Yes Suicidal Plan?: Yes-Currently Present Specify Current Suicidal Plan: Pt reported, walking in traffic and cutting his wrist.  Access to Means: Yes Specify Access to Suicidal Means: Pt has access to knives and is able to walk into traffic.  What has been your use of drugs/alcohol within the last 12 months?: Crack cocaine How many times?: 2 Other Self Harm Risks: cutting Triggers for Past Attempts: Unpredictable Intentional Self Injurious Behavior: Cutting Comment - Self Injurious Behavior: Pt reported cutting his wrisit as a suicide attempt.  Risk to Others: Homicidal Ideation: No Thoughts of Harm to Others: No Current Homicidal Intent: No Current Homicidal Plan: No Access to Homicidal Means: No Identified Victim: NA History of harm to others?: No Assessment of Violence: None Noted Violent Behavior Description: NA Does patient have access to weapons?:  Yes (Comment) (knives) Criminal Charges Pending?: No Does patient have a court date: No Prior Inpatient Therapy: Prior Inpatient Therapy: Yes Prior Therapy Dates: 2017 Prior Therapy Facilty/Provider(s): [facility] Reason for Treatment: suicide attempts.  Prior Outpatient Therapy: Prior Outpatient Therapy: No Prior Therapy Dates: Na Prior Therapy Facilty/Provider(s): NA Reason for Treatment: NA Does patient have an ACCT team?: No Does patient have Intensive In-House Services?  : No Does patient have [facility] services? : No Does patient have P4CC services?: No  Past Medical History:  Past Medical History:  Diagnosis Date  . A-fib ([place])   . Asthma    No PFTs, history of childhood asthma  . CAD (coronary artery disease)    a. 06/2013 STEMI/PCI ([facility]): LAD w/ thrombus (treated with BMS), mid 75%, D2 75%; LCX OM2 75%; RCA small, PDA 95%, PLV 95%;  b. 10/2013 Cath/PCI: ISR w/in LAD (Promus DES x 2), borderline OM2 lesion;  c. 01/2014 MV: Intermediate risk, medium-sized distal ant wall infarct w/ very small amt of peri-infarct ischemia. EF 60%.  . Cellulitis 04/2014   left facial  . Chondromalacia of medial femoral condyle    Left knee MRI 04/28/12: Chondromalacia of the medial femoral condyle with slight peripheral degeneration of the meniscocapsular junction of the medial meniscus; followed by sports medicine  . Collagen vascular disease ([place])   . Crack cocaine use    for 20+ years, has been enrolled in detox programs in the past  . Depression    with history of hospitalization for suicidal ideation  . Diabetes mellitus 2002   Diagnosed in 2002, started insulin in 2012  . Gout   . Gout 04/28/2012  . Headache(784.0)    CT head 08/2011: Periventricular and subcortical white matter hypodensities are most in keeping with chronic  microangiopathic change  . HIV infection [facility]) Nov 2012   Followed by [name]  . Hyperlipidemia   . Hypertension   . Pulmonary embolism [facility])     Past Surgical  History:  Procedure Laterality Date  . BACK SURGERY     1988  . BOWEL RESECTION    . CARDIAC SURGERY    . CERVICAL SPINE SURGERY     " rods in my neck "  . CORONARY STENT PLACEMENT    . NM MYOCAR PERF WALL MOTION  12/27/2011   normal  . SPINE SURGERY     Family History:  Family History  Problem Relation Age of Onset  . Diabetes Mother   . Hypertension Mother   . Hyperlipidemia Mother   . Diabetes Father   . Cancer Father   . Hypertension Father   . Diabetes Brother   . Heart disease Brother   . Diabetes Sister   . Colon cancer Neg Hx    Family Psychiatric  History: unknown Social History:  History  Alcohol Use No     History  Drug Use  . Types: "Crack" cocaine, Cocaine    Comment: last used crack 08/2015.     Social History   Social History  . Marital status: Divorced    Spouse name: N/A  . Number of children: N/A  . Years of education: 45   Occupational History  .  Unemployed    04/2016   Social History Main Topics  . Smoking status: Never Smoker  . Smokeless tobacco: Never Used  . Alcohol use No  . Drug use: Yes    Types: "Crack" cocaine, Cocaine     Comment: last used crack 08/2015.   Marland Kitchen Sexual activity: Yes     Comment: accepted condoms   Other Topics Concern  . None   Social History Narrative   Currently staying with a friend in [place].  Was staying @ local motel until a few days ago - left b/c of bed bugs.   Additional Social History:    Allergies:  No Known Allergies  Labs:  Results for orders placed or performed during the hospital encounter of 11/22/16 (from the past 48 hour(s))  Basic metabolic panel     Status: Abnormal   Collection Time: 11/22/16  1:24 PM  Result Value Ref Range   Sodium 138 135 - 145 mmol/L   Potassium 3.8 3.5 - 5.1 mmol/L   Chloride 110 101 - 111 mmol/L   CO2 22 22 - 32 mmol/L   Glucose, Bld 434 (H) 65 - 99 mg/dL   BUN 17 6 - 20 mg/dL   Creatinine, Ser 1.54 (H) 0.61 - 1.24 mg/dL   Calcium 8.4 (L) 8.9 - 10.3  mg/dL   GFR calc non Af Amer 45 (L) >60 mL/min   GFR calc Af Amer 53 (L) >60 mL/min    Comment: (NOTE) The eGFR has been calculated using the CKD EPI equation. This calculation has not been validated in all clinical situations. eGFR's persistently <60 mL/min signify possible Chronic Kidney Disease.    Anion gap 6 5 - 15  CBC     Status: Abnormal   Collection Time: 11/22/16  1:24 PM  Result Value Ref Range   WBC 6.9 4.0 - 10.5 K/uL   RBC 3.87 (L) 4.22 - 5.81 MIL/uL   Hemoglobin 10.2 (L) 13.0 - 17.0 g/dL   HCT 31.2 (L) 39.0 - 52.0 %   MCV 80.6 78.0 - 100.0 fL  MCH 26.4 26.0 - 34.0 pg   MCHC 32.7 30.0 - 36.0 g/dL   RDW 13.9 11.5 - 15.5 %   Platelets 153 150 - 400 K/uL  Ethanol     Status: None   Collection Time: 11/22/16  1:24 PM  Result Value Ref Range   Alcohol, Ethyl (B) <5 <5 mg/dL    Comment:        LOWEST DETECTABLE LIMIT FOR SERUM ALCOHOL IS 5 mg/dL FOR MEDICAL PURPOSES ONLY   Salicylate level     Status: None   Collection Time: 11/22/16  1:24 PM  Result Value Ref Range   Salicylate Lvl <3.2 2.8 - 30.0 mg/dL  Acetaminophen level     Status: Abnormal   Collection Time: 11/22/16  1:24 PM  Result Value Ref Range   Acetaminophen (Tylenol), Serum <10 (L) 10 - 30 ug/mL    Comment:        THERAPEUTIC CONCENTRATIONS VARY SIGNIFICANTLY. A RANGE OF 10-30 ug/mL MAY BE AN EFFECTIVE CONCENTRATION FOR MANY PATIENTS. HOWEVER, SOME ARE BEST TREATED AT CONCENTRATIONS OUTSIDE THIS RANGE. ACETAMINOPHEN CONCENTRATIONS >150 ug/mL AT 4 HOURS AFTER INGESTION AND >50 ug/mL AT 12 HOURS AFTER INGESTION ARE OFTEN ASSOCIATED WITH TOXIC REACTIONS.   I-stat troponin, ED     Status: None   Collection Time: 11/22/16  1:33 PM  Result Value Ref Range   Troponin i, poc 0.00 0.00 - 0.08 ng/mL   Comment 3            Comment: Due to the release kinetics of cTnI, a negative result within the first hours of the onset of symptoms does not rule out myocardial infarction with certainty. If  myocardial infarction is still suspected, repeat the test at appropriate intervals.   Rapid urine drug screen (hospital performed)     Status: Abnormal   Collection Time: 11/22/16  1:59 PM  Result Value Ref Range   Opiates NONE DETECTED NONE DETECTED   Cocaine POSITIVE (A) NONE DETECTED   Benzodiazepines NONE DETECTED NONE DETECTED   Amphetamines NONE DETECTED NONE DETECTED   Tetrahydrocannabinol NONE DETECTED NONE DETECTED   Barbiturates NONE DETECTED NONE DETECTED    Comment:        DRUG SCREEN FOR MEDICAL PURPOSES ONLY.  IF CONFIRMATION IS NEEDED FOR ANY PURPOSE, NOTIFY LAB WITHIN 5 DAYS.        LOWEST DETECTABLE LIMITS FOR URINE DRUG SCREEN Drug Class       Cutoff (ng/mL) Amphetamine      1000 Barbiturate      200 Benzodiazepine   202 Tricyclics       542 Opiates          300 Cocaine          300 THC              50   POC CBG, ED     Status: Abnormal   Collection Time: 11/22/16  4:57 PM  Result Value Ref Range   Glucose-Capillary 191 (H) 65 - 99 mg/dL  CBG monitoring, ED     Status: Abnormal   Collection Time: 11/22/16  8:56 PM  Result Value Ref Range   Glucose-Capillary 301 (H) 65 - 99 mg/dL  CBG monitoring, ED     Status: Abnormal   Collection Time: 11/23/16  3:00 AM  Result Value Ref Range   Glucose-Capillary 247 (H) 65 - 99 mg/dL  CBG monitoring, ED     Status: Abnormal   Collection Time: 11/23/16  7:49 AM  Result Value Ref Range   Glucose-Capillary 199 (H) 65 - 99 mg/dL    Current Facility-Administered Medications  Medication Dose Route Frequency Provider Last Rate Last Dose  . albuterol (PROVENTIL HFA;VENTOLIN HFA) 108 (90 Base) MCG/ACT inhaler 2 puff  2 puff Inhalation Q6H PRN Davonna Belling, MD      . allopurinol (ZYLOPRIM) tablet 200 mg  200 mg Oral Daily Davonna Belling, MD   200 mg at 11/23/16 0913  . amantadine (SYMMETREL) capsule 100 mg  100 mg Oral Daily Davonna Belling, MD   100 mg at 11/23/16 0914  . aspirin EC tablet 81 mg  81 mg Oral Daily  Davonna Belling, MD   81 mg at 11/23/16 0914  . buPROPion (WELLBUTRIN XL) 24 hr tablet 150 mg  150 mg Oral Daily Davonna Belling, MD   150 mg at 11/23/16 0913  . cyclobenzaprine (FLEXERIL) tablet 10 mg  10 mg Oral TID PRN Davonna Belling, MD   10 mg at 11/22/16 2116  . diltiazem (CARDIZEM CD) 24 hr capsule 120 mg  120 mg Oral Daily Davonna Belling, MD   120 mg at 11/23/16 0914  . emtricitabine-rilpivir-tenofovir AF (ODEFSEY) 200-25-25 MG per tablet 1 tablet  1 tablet Oral Q breakfast Davonna Belling, MD   1 tablet at 11/23/16 0914  . famotidine (PEPCID) tablet 20 mg  20 mg Oral Daily Davonna Belling, MD   20 mg at 11/23/16 0914  . gabapentin (NEURONTIN) capsule 400 mg  400 mg Oral TID Davonna Belling, MD   400 mg at 11/23/16 0913  . glipiZIDE (GLUCOTROL XL) 24 hr tablet 5 mg  5 mg Oral Q breakfast Davonna Belling, MD   5 mg at 11/23/16 0914  . insulin aspart (novoLOG) injection 3 Units  3 Units Subcutaneous TID [facility] Carmin Muskrat, MD   3 Units at 11/23/16 [phone]  . insulin glargine (LANTUS) injection 25 Units  25 Units Subcutaneous QHS Davonna Belling, MD   25 Units at 11/22/16 2117  . isosorbide mononitrate (IMDUR) 24 hr tablet 30 mg  30 mg Oral Daily Davonna Belling, MD   30 mg at 11/23/16 0914  . losartan (COZAAR) tablet 50 mg  50 mg Oral Daily Davonna Belling, MD   50 mg at 11/23/16 0915  . metFORMIN (GLUCOPHAGE) tablet 500 mg  500 mg Oral BID AC Davonna Belling, MD   500 mg at 11/23/16 0914  . metoprolol succinate (TOPROL-XL) 24 hr tablet 25 mg  25 mg Oral Daily Davonna Belling, MD   25 mg at 11/23/16 0915  . mirtazapine (REMERON) tablet 7.5 mg  7.5 mg Oral QHS Davonna Belling, MD   7.5 mg at 11/22/16 2136  . pravastatin (PRAVACHOL) tablet 40 mg  40 mg Oral QHS Davonna Belling, MD   40 mg at 11/22/16 2136  . risperiDONE (RISPERDAL) tablet 1 mg  1 mg Oral QHS Davonna Belling, MD   1 mg at 11/22/16 2136  . Rivaroxaban (XARELTO) tablet 15 mg  15 mg Oral BID WC Davonna Belling, MD    15 mg at 11/23/16 0915   Current Outpatient Prescriptions  Medication Sig Dispense Refill  . albuterol (PROAIR HFA) 108 (90 Base) MCG/ACT inhaler Inhale 2 puffs into the lungs every 6 (six) hours as needed for wheezing or shortness of breath. 1 Inhaler 0  . allopurinol (ZYLOPRIM) 100 MG tablet Take 200 mg by mouth daily.    Marland Kitchen amantadine (SYMMETREL) 100 MG capsule Take 100 mg by mouth daily.     Marland Kitchen aspirin EC  81 MG tablet Take 81 mg by mouth daily.    Marland Kitchen buPROPion (WELLBUTRIN XL) 150 MG 24 hr tablet Take 150 mg by mouth daily.    . clopidogrel (PLAVIX) 75 MG tablet Take 75 mg by mouth daily.    . cyclobenzaprine (FLEXERIL) 10 MG tablet Take 10 mg by mouth 3 (three) times daily as needed for muscle spasms.    . diclofenac sodium (VOLTAREN) 1 % GEL Apply 2 g topically 4 (four) times daily as needed (for pain).    Marland Kitchen diltiazem (CARDIZEM CD) 120 MG 24 hr capsule Take 1 capsule (120 mg total) by mouth daily. 30 capsule 0  . emtricitabine-rilpivir-tenofovir DF (COMPLERA) 200-25-300 MG tablet Take 1 tablet by mouth daily. 30 tablet 0  . famotidine (PEPCID) 20 MG tablet Take 20 mg by mouth daily.    Marland Kitchen gabapentin (NEURONTIN) 400 MG capsule Take 400 mg by mouth 3 (three) times daily.    Marland Kitchen glipiZIDE (GLUCOTROL XL) 5 MG 24 hr tablet Take 5 mg by mouth daily with breakfast.    . insulin aspart (NOVOLOG) 100 UNIT/ML injection Inject 2-4 Units into the skin 3 (three) times daily before meals. Pt uses per sliding scale.    . insulin glargine (LANTUS) 100 unit/mL SOPN Inject 25 Units into the skin at bedtime.    . isosorbide mononitrate (IMDUR) 30 MG 24 hr tablet Take 1 tablet (30 mg total) by mouth daily. 30 tablet 0  . losartan (COZAAR) 50 MG tablet Take 1 tablet (50 mg total) by mouth daily. 14 tablet 0  . metFORMIN (GLUCOPHAGE) 500 MG tablet Take 500 mg by mouth 2 (two) times daily before a meal.    . metoprolol succinate (TOPROL-XL) 25 MG 24 hr tablet Take 1 tablet (25 mg total) by mouth daily. 30 tablet 0  .  mirtazapine (REMERON) 7.5 MG tablet Take 7.5 mg by mouth at bedtime.    . pravastatin (PRAVACHOL) 40 MG tablet Take 40 mg by mouth at bedtime.    . risperiDONE (RISPERDAL) 1 MG tablet Take 1 tablet (1 mg total) by mouth at bedtime. 30 tablet 0    Musculoskeletal: Strength & Muscle Tone: within normal limits Gait & Station: normal Patient leans: N/A  Psychiatric Specialty Exam: Physical Exam  Nursing note and vitals reviewed.   Review of Systems  Psychiatric/Behavioral: Positive for depression, substance abuse and suicidal ideas.    Blood pressure (!) 101/50, pulse 93, temperature 98.7 F (37.1 C), temperature source Oral, resp. rate 20, height _0  (1.702 m), weight 78.9 kg (174 lb), SpO2 99 %.Body mass index is 27.25 kg/m.  General Appearance: Fairly Groomed  Eye Contact:  Fair  Speech:  Clear and Coherent and Normal Rate  Volume:  Normal  Mood:  Depressed  Affect:  Depressed and Flat  Thought Process:  Coherent  Orientation:  Full (Time, Place, and Person)  Thought Content:  Logical  Suicidal Thoughts:  Yes.  without intent/plan  Homicidal Thoughts:  No  Memory:  Immediate;   Fair Recent;   Fair  Judgement:  Fair  Insight:  Fair  Psychomotor Activity:  Normal  Concentration:  Concentration: Fair and Attention Span: Fair  Recall:  [employer] of Knowledge:  Fair  Language:  Fair  Akathisia:  No  Handed:  Right  AIMS (if indicated):     Assets:  Agricultural consultant Housing Resilience  ADL's:  Intact  Cognition:  WNL  Sleep:      Case discussed with Dr.  Evertte Sones: recommendations are: Treatment Plan Summary: Daily contact with patient to assess and evaluate symptoms and progress in treatment and Medication management Continue home medications for medical conditions Wellbutrin XL 150 mg daily Remeron 7.5 mg at bedtime. Risperdal 1 mg at bedtime  Disposition: Recommend psychiatric Inpatient admission when medically cleared.  appropriate inpatient bed will be located at either [address] at [facility] or at a geropsychiatric facility.  Serena Colonel, PMHNP-BC, FNP-BC [place] 11/23/2016 12:15 PM   Patient seen face-to-face for this evaluation, case discussed with treatment team and physician extender and formulated treatment plan. Reviewed the information documented and agree with the treatment plan.  Landon Bassford [facility] 11/23/2016 3:35 PM

## 2016-11-23 NOTE — ED Notes (Signed)
EKG given to Dr. Zenia Resides for review.

## 2016-11-23 NOTE — ED Notes (Signed)
Pt compliant with morning medication regimen. Pt presents with sad affect, endorsing passive SI. Encouragement and support provided. Special checks q 15 mins in place for safety. Video monitoring in place. Will continue to monitor.

## 2016-11-24 LAB — CBG MONITORING, ED
GLUCOSE-CAPILLARY: 122 mg/dL — AB (ref 65–99)
Glucose-Capillary: 143 mg/dL — ABNORMAL HIGH (ref 65–99)
Glucose-Capillary: 110 mg/dL — ABNORMAL HIGH (ref 65–99)
Glucose-Capillary: 119 mg/dL — ABNORMAL HIGH (ref 65–99)

## 2016-11-24 NOTE — BH Assessment (Signed)
Patient was reassessed by TTS.   Patient was in his room in bed with the lights off. Patient states that he "feels a little better today." Patient states that he came into the hospital because he was "stressed and suicidal."  Patient states that his suicidal ideations "come and go." Patient states denies homicidal ideations. Patient denies auditory and visual hallucinations. Patient states that he has been taking medications. Patient states that he did not sleep well last night.   Consulted with Dr. Lenna Sciara who continues to recommend inpatient treatment at this time.   Rosalin Hawking, LCSW Therapeutic Triage Specialist [place] 11/24/2016 12:25 PM

## 2016-11-24 NOTE — ED Notes (Signed)
SBAR Report received from previous nurse. Pt received calm and visible on unit. Pt denies current  HI, A/V H, depression, anxiety, or pain at this time, and appears otherwise stable and free of distress. Pt endorses less hallucinations, and passive SI. Pt reminded of camera surveillance, q 15 min rounds, and rules of the milieu. Will continue to assess.

## 2016-11-24 NOTE — Progress Notes (Signed)
CSW faxed patient's referral to: [facility], [place].

## 2016-11-24 NOTE — ED Notes (Signed)
C/O SOB. Report to NP.

## 2016-11-25 DIAGNOSIS — Z79899 Other long term (current) drug therapy: Secondary | ICD-10-CM | POA: Diagnosis not present

## 2016-11-25 DIAGNOSIS — F1424 Cocaine dependence with cocaine-induced mood disorder: Secondary | ICD-10-CM | POA: Diagnosis not present

## 2016-11-25 DIAGNOSIS — R45851 Suicidal ideations: Secondary | ICD-10-CM

## 2016-11-25 DIAGNOSIS — Z7982 Long term (current) use of aspirin: Secondary | ICD-10-CM | POA: Diagnosis not present

## 2016-11-25 DIAGNOSIS — Z794 Long term (current) use of insulin: Secondary | ICD-10-CM | POA: Diagnosis not present

## 2016-11-25 LAB — CBG MONITORING, ED
GLUCOSE-CAPILLARY: 123 mg/dL — AB (ref 65–99)
GLUCOSE-CAPILLARY: 88 mg/dL (ref 65–99)
GLUCOSE-CAPILLARY: 207 mg/dL — AB (ref 65–99)

## 2016-11-25 MED ORDER — RISPERIDONE 1 MG PO TABS
1.0000 mg | ORAL_TABLET | Freq: Two times a day (BID) | ORAL | Status: DC
  Administered 2016-11-25 – 2016-11-26 (×2): 1 mg via ORAL
  Filled 2016-11-25 (×2): qty 1

## 2016-11-25 MED ORDER — RIVAROXABAN 15 MG PO TABS
15.0000 mg | ORAL_TABLET | Freq: Two times a day (BID) | ORAL | Status: DC
  Administered 2016-11-25 – 2016-11-26 (×2): 15 mg via ORAL
  Filled 2016-11-25 (×3): qty 1

## 2016-11-25 MED ORDER — ESCITALOPRAM OXALATE 10 MG PO TABS
5.0000 mg | ORAL_TABLET | Freq: Every day | ORAL | Status: DC
  Administered 2016-11-25 – 2016-11-26 (×2): 5 mg via ORAL
  Filled 2016-11-25 (×2): qty 1

## 2016-11-25 MED ORDER — RIVAROXABAN 20 MG PO TABS: 20.0000 mg | ORAL_TABLET | Freq: Every day | ORAL | Status: DC

## 2016-11-25 NOTE — ED Notes (Signed)
Pt showed this nurse sputum with light blood tinged. This nurse notified EDP, no new orders at this time. Will continue to monitor.

## 2016-11-25 NOTE — ED Notes (Signed)
Pt compliant with medication regimen. Pt behavior cooperative, Presents with sad affect, forwards little with this nurse. Encouragement and support provided. Special checks q 15 mins in place for safety. Video monitoring in place. Will continue to monitor.

## 2016-11-25 NOTE — ED Notes (Signed)
Patient alert and engages in conversation with this Probation officer. States he has SI thoughts that comes and goes. Patient is able to contract for safety. Patient offered support and encouragement. Q 15 minute checks in progress and patient remains safe on unit.

## 2016-11-25 NOTE — Consult Note (Signed)
[facility] Consult   Reason for Consult:  Cocaine abuse with hallucinations Referring Physician:  EDP Patient Identification: Michael [name] MRN:  [id number] Principal Diagnosis: Cocaine dependence with cocaine-induced mood disorder [facility]) Diagnosis:   Patient Active Problem List   Diagnosis Date Noted  . Cocaine dependence with cocaine-induced mood disorder ([place]) [F14.24]     Priority: High  . Cocaine use disorder, severe, dependence ([place]) [F14.20] 12/13/2014    Priority: High  . Substance induced mood disorder ([place]) [F19.94] 11/23/2016  . Back pain [M54.9] 04/18/2016  . S/P carotid endarterectomy [Z98.890] 11/15/2015  . MDD (major depressive disorder), recurrent severe, without psychosis ([place]) [F33.2] 09/09/2015  . Cocaine-induced mood disorder ([place]) [F14.94] 08/14/2015  . Gout [M10.9] 07/10/2015  . Cough [R05] 04/26/2015  . Lactic acidosis [E87.2] 03/06/2015  . CKD (chronic kidney disease) stage 3, GFR 30-59 ml/min [N18.3] 03/06/2015  . Normocytic anemia [D64.9] 03/06/2015  . Hypoglycemia [E16.2]   . Encounter for general adult medical examination with abnormal findings [Z00.01] 02/09/2015  . Substance or medication-induced depressive disorder with onset during withdrawal ([place]) [F19.94] 12/13/2014  . Cervicalgia [M54.2] 06/28/2014  . Lumbar radiculopathy, chronic [M54.16] 06/28/2014  . Asthma, chronic [J45.909] 02/03/2014  . 3-vessel CAD [I25.10] 06/24/2013  . ED (erectile dysfunction) of organic origin [N52.9] 07/07/2012  . Hypertension goal BP (blood pressure) < 140/80 [I10] 04/29/2012  . Chondromalacia of left knee [M94.262] 03/19/2012  . Hyperlipidemia with target LDL less than 100 [E78.5] 02/12/2012  . Fibromyalgia [M79.7] 02/12/2012  . HIV (human immunodeficiency virus infection) ([place]) [B20] 08/27/2011  . Uncontrolled type 2 diabetes with neuropathy ([place]) [E11.40, E11.65] 10/17/2000    Total Time spent with patient: 30 minutes  Subjective:   BASHEER [name] is a 66 y.o. male patient admitted with psychosis.  HPI:  66 yo man who came to the ED with hallucinations and suicidal ideations.  Today, he has intermittent, passive suicidal ideations and sees walls moving.  Medications adjusted and geriatric psych being sought.    Past Psychiatric History: cocaine abuse, depression  Risk to Self: Suicidal Ideation: Yes-Currently Present Suicidal Intent: Yes-Currently Present Is patient at risk for suicide?: Yes Suicidal Plan?: Yes-Currently Present Specify Current Suicidal Plan: Pt reported, walking in traffic and cutting his wrist.  Access to Means: Yes Specify Access to Suicidal Means: Pt has access to knives and is able to walk into traffic.  What has been your use of drugs/alcohol within the last 12 months?: Crack cocaine How many times?: 2 Other Self Harm Risks: cutting Triggers for Past Attempts: Unpredictable Intentional Self Injurious Behavior: Cutting Comment - Self Injurious Behavior: Pt reported cutting his wrisit as a suicide attempt.  Risk to Others: Homicidal Ideation: No Thoughts of Harm to Others: No Current Homicidal Intent: No Current Homicidal Plan: No Access to Homicidal Means: No Identified Victim: NA History of harm to others?: No Assessment of Violence: None Noted Violent Behavior Description: NA Does patient have access to weapons?: Yes (Comment) (knives) Criminal Charges Pending?: No Does patient have a court date: No Prior Inpatient Therapy: Prior Inpatient Therapy: Yes Prior Therapy Dates: 2017 Prior Therapy Facilty/Provider(s): [facility] Reason for Treatment: suicide attempts.  Prior Outpatient Therapy: Prior Outpatient Therapy: No Prior Therapy Dates: Na Prior Therapy Facilty/Provider(s): NA Reason for Treatment: NA Does patient have an ACCT team?: No Does patient have Intensive In-House Services?  : No Does patient have [facility] services? : No Does patient have P4CC services?: No  Past Medical  History:  Past Medical History:  Diagnosis Date  . A-fib ([place])   . Asthma    No PFTs, history of childhood asthma  . CAD (coronary artery disease)    a. 06/2013 STEMI/PCI ([facility]): LAD w/ thrombus (treated with BMS), mid 75%, D2 75%; LCX OM2 75%; RCA small, PDA 95%, PLV 95%;  b. 10/2013 Cath/PCI: ISR w/in LAD (Promus DES x 2), borderline OM2 lesion;  c. 01/2014 MV: Intermediate risk, medium-sized distal ant wall infarct w/ very small amt of peri-infarct ischemia. EF 60%.  . Cellulitis 04/2014   left facial  . Chondromalacia of medial femoral condyle    Left knee MRI 04/28/12: Chondromalacia of the medial femoral condyle with slight peripheral degeneration of the meniscocapsular junction of the medial meniscus; followed by sports medicine  . Collagen vascular disease ([place])   . Crack cocaine use    for 20+ years, has been enrolled in detox programs in the past  . Depression    with history of hospitalization for suicidal ideation  . Diabetes mellitus 2002   Diagnosed in 2002, started insulin in 2012  . Gout   . Gout 04/28/2012  . Headache(784.0)    CT head 08/2011: Periventricular and subcortical white matter hypodensities are most in keeping with chronic microangiopathic change  . HIV infection [facility]) Nov 2012   Followed by [name]  . Hyperlipidemia   . Hypertension   . Pulmonary embolism [facility])     Past Surgical History:  Procedure Laterality Date  . BACK SURGERY     1988  . BOWEL RESECTION    . CARDIAC SURGERY    . CERVICAL SPINE SURGERY     " rods in my neck "  . CORONARY STENT PLACEMENT    . NM MYOCAR PERF WALL MOTION  12/27/2011   normal  . SPINE SURGERY     Family History:  Family History  Problem Relation Age of Onset  . Diabetes Mother   . Hypertension Mother   . Hyperlipidemia Mother   . Diabetes Father   . Cancer Father   . Hypertension Father   . Diabetes Brother   . Heart disease Brother   . Diabetes Sister   . Colon cancer Neg Hx    Family Psychiatric   History: none Social History:  History  Alcohol Use No     History  Drug Use  . Types: "Crack" cocaine, Cocaine    Comment: last used crack 08/2015.     Social History   Social History  . Marital status: Divorced    Spouse name: N/A  . Number of children: N/A  . Years of education: 3   Occupational History  .  Unemployed    04/2016   Social History Main Topics  . Smoking status: Never Smoker  . Smokeless tobacco: Never Used  . Alcohol use No  . Drug use: Yes    Types: "Crack" cocaine, Cocaine     Comment: last used crack 08/2015.   Marland Kitchen Sexual activity: Yes     Comment: accepted condoms   Other Topics Concern  . None   Social History Narrative   Currently staying with a friend in [place].  Was staying @ local motel until a few days ago - left b/c of bed bugs.   Additional Social History:    Allergies:  No Known Allergies  Labs:  Results for orders placed or performed during the hospital encounter of 11/22/16 (from the past 48 hour(s))  CBG monitoring, ED     Status: Abnormal  Collection Time: 11/23/16  5:35 PM  Result Value Ref Range   Glucose-Capillary 192 (H) 65 - 99 mg/dL  CBG monitoring, ED     Status: Abnormal   Collection Time: 11/23/16  9:15 PM  Result Value Ref Range   Glucose-Capillary 118 (H) 65 - 99 mg/dL  CBG monitoring, ED     Status: Abnormal   Collection Time: 11/24/16  7:43 AM  Result Value Ref Range   Glucose-Capillary 122 (H) 65 - 99 mg/dL  CBG monitoring, ED     Status: Abnormal   Collection Time: 11/24/16 12:42 PM  Result Value Ref Range   Glucose-Capillary 143 (H) 65 - 99 mg/dL  CBG monitoring, ED     Status: Abnormal   Collection Time: 11/24/16  5:52 PM  Result Value Ref Range   Glucose-Capillary 110 (H) 65 - 99 mg/dL  CBG monitoring, ED     Status: Abnormal   Collection Time: 11/24/16  9:33 PM  Result Value Ref Range   Glucose-Capillary 119 (H) 65 - 99 mg/dL  CBG monitoring, ED     Status: Abnormal   Collection Time: 11/25/16   7:30 AM  Result Value Ref Range   Glucose-Capillary 123 (H) 65 - 99 mg/dL  CBG monitoring, ED     Status: None   Collection Time: 11/25/16 12:28 PM  Result Value Ref Range   Glucose-Capillary 88 65 - 99 mg/dL  CBG monitoring, ED     Status: Abnormal   Collection Time: 11/25/16  5:26 PM  Result Value Ref Range   Glucose-Capillary 207 (H) 65 - 99 mg/dL    Current Facility-Administered Medications  Medication Dose Route Frequency Provider Last Rate Last Dose  . albuterol (PROVENTIL HFA;VENTOLIN HFA) 108 (90 Base) MCG/ACT inhaler 2 puff  2 puff Inhalation Q6H PRN Davonna Belling, MD   2 puff at 11/24/16 1242  . allopurinol (ZYLOPRIM) tablet 200 mg  200 mg Oral Daily Davonna Belling, MD   200 mg at 11/25/16 1021  . amantadine (SYMMETREL) capsule 100 mg  100 mg Oral Daily Davonna Belling, MD   100 mg at 11/25/16 1021  . aspirin EC tablet 81 mg  81 mg Oral Daily Davonna Belling, MD   81 mg at 11/25/16 1022  . buPROPion (WELLBUTRIN XL) 24 hr tablet 150 mg  150 mg Oral Daily Davonna Belling, MD   150 mg at 11/25/16 1021  . cyclobenzaprine (FLEXERIL) tablet 10 mg  10 mg Oral TID PRN Davonna Belling, MD   10 mg at 11/22/16 2116  . diltiazem (CARDIZEM CD) 24 hr capsule 120 mg  120 mg Oral Daily Davonna Belling, MD   120 mg at 11/25/16 1021  . emtricitabine-rilpivir-tenofovir AF (ODEFSEY) 200-25-25 MG per tablet 1 tablet  1 tablet Oral Q breakfast Davonna Belling, MD   1 tablet at 11/25/16 0803  . famotidine (PEPCID) tablet 20 mg  20 mg Oral Daily Davonna Belling, MD   20 mg at 11/25/16 1022  . gabapentin (NEURONTIN) capsule 400 mg  400 mg Oral TID Davonna Belling, MD   400 mg at 11/25/16 1657  . glipiZIDE (GLUCOTROL XL) 24 hr tablet 5 mg  5 mg Oral Q breakfast Davonna Belling, MD   5 mg at 11/25/16 0755  . insulin aspart (novoLOG) injection 3 Units  3 Units Subcutaneous TID [facility] Carmin Muskrat, MD   3 Units at 11/25/16 [phone]  . insulin glargine (LANTUS) injection 25 Units  25 Units  Subcutaneous QHS Davonna Belling, MD   25 Units  at 11/24/16 2139  . isosorbide mononitrate (IMDUR) 24 hr tablet 30 mg  30 mg Oral Daily Davonna Belling, MD   30 mg at 11/25/16 1021  . losartan (COZAAR) tablet 50 mg  50 mg Oral Daily Davonna Belling, MD   50 mg at 11/25/16 1022  . metFORMIN (GLUCOPHAGE) tablet 500 mg  500 mg Oral BID AC Davonna Belling, MD   500 mg at 11/25/16 1657  . metoprolol succinate (TOPROL-XL) 24 hr tablet 25 mg  25 mg Oral Daily Davonna Belling, MD   25 mg at 11/25/16 1022  . mirtazapine (REMERON) tablet 7.5 mg  7.5 mg Oral QHS Davonna Belling, MD   7.5 mg at 11/24/16 2135  . pravastatin (PRAVACHOL) tablet 40 mg  40 mg Oral QHS Davonna Belling, MD   40 mg at 11/24/16 2135  . risperiDONE (RISPERDAL) tablet 1 mg  1 mg Oral BID Patrecia Pour, NP      . Rivaroxaban (XARELTO) tablet 15 mg  15 mg Oral BID WC Davonna Belling, MD   15 mg at 11/25/16 1657   Followed by  . [START ON 12/13/2016] rivaroxaban (XARELTO) tablet 20 mg  20 mg Oral Q supper Davonna Belling, MD       Current Outpatient Prescriptions  Medication Sig Dispense Refill  . albuterol (PROAIR HFA) 108 (90 Base) MCG/ACT inhaler Inhale 2 puffs into the lungs every 6 (six) hours as needed for wheezing or shortness of breath. 1 Inhaler 0  . allopurinol (ZYLOPRIM) 100 MG tablet Take 200 mg by mouth daily.    Marland Kitchen amantadine (SYMMETREL) 100 MG capsule Take 100 mg by mouth daily.     Marland Kitchen aspirin EC 81 MG tablet Take 81 mg by mouth daily.    Marland Kitchen buPROPion (WELLBUTRIN XL) 150 MG 24 hr tablet Take 150 mg by mouth daily.    . clopidogrel (PLAVIX) 75 MG tablet Take 75 mg by mouth daily.    . cyclobenzaprine (FLEXERIL) 10 MG tablet Take 10 mg by mouth 3 (three) times daily as needed for muscle spasms.    . diclofenac sodium (VOLTAREN) 1 % GEL Apply 2 g topically 4 (four) times daily as needed (for pain).    Marland Kitchen diltiazem (CARDIZEM CD) 120 MG 24 hr capsule Take 1 capsule (120 mg total) by mouth daily. 30 capsule 0  .  emtricitabine-rilpivir-tenofovir DF (COMPLERA) 200-25-300 MG tablet Take 1 tablet by mouth daily. 30 tablet 0  . famotidine (PEPCID) 20 MG tablet Take 20 mg by mouth daily.    Marland Kitchen gabapentin (NEURONTIN) 400 MG capsule Take 400 mg by mouth 3 (three) times daily.    Marland Kitchen glipiZIDE (GLUCOTROL XL) 5 MG 24 hr tablet Take 5 mg by mouth daily with breakfast.    . insulin aspart (NOVOLOG) 100 UNIT/ML injection Inject 2-4 Units into the skin 3 (three) times daily before meals. Pt uses per sliding scale.    . insulin glargine (LANTUS) 100 unit/mL SOPN Inject 25 Units into the skin at bedtime.    . isosorbide mononitrate (IMDUR) 30 MG 24 hr tablet Take 1 tablet (30 mg total) by mouth daily. 30 tablet 0  . losartan (COZAAR) 50 MG tablet Take 1 tablet (50 mg total) by mouth daily. 14 tablet 0  . metFORMIN (GLUCOPHAGE) 500 MG tablet Take 500 mg by mouth 2 (two) times daily before a meal.    . metoprolol succinate (TOPROL-XL) 25 MG 24 hr tablet Take 1 tablet (25 mg total) by mouth daily. 30 tablet 0  .  mirtazapine (REMERON) 7.5 MG tablet Take 7.5 mg by mouth at bedtime.    . pravastatin (PRAVACHOL) 40 MG tablet Take 40 mg by mouth at bedtime.    . risperiDONE (RISPERDAL) 1 MG tablet Take 1 tablet (1 mg total) by mouth at bedtime. 30 tablet 0    Musculoskeletal: Strength & Muscle Tone: within normal limits Gait & Station: normal Patient leans: N/A  Psychiatric Specialty Exam: Physical Exam  Constitutional: He is oriented to person, place, and time. He appears well-developed and well-nourished.  HENT:  Head: Normocephalic.  Neck: Normal range of motion.  Respiratory: Effort normal.  Musculoskeletal: Normal range of motion.  Neurological: He is alert and oriented to person, place, and time.  Psychiatric: His speech is normal. Judgment and thought content normal. His mood appears anxious. He is actively hallucinating. Cognition and memory are normal.    Review of Systems  Psychiatric/Behavioral: Positive for  hallucinations and substance abuse.  All other systems reviewed and are negative.   Blood pressure 102/65, pulse 75, temperature 98.2 F (36.8 C), temperature source Oral, resp. rate 17, height 5\' 7"  (1.702 m), weight 78.9 kg (174 lb), SpO2 100 %.Body mass index is 27.25 kg/m.  General Appearance: Casual  Eye Contact:  Fair  Speech:  Normal Rate  Volume:  Normal  Mood:  Anxious  Affect:  Congruent  Thought Process:  Coherent and Descriptions of Associations: Intact  Orientation:  Full (Time, Place, and Person)  Thought Content:  Hallucinations: Visual  Suicidal Thoughts:  Yes.  without intent/plan  Homicidal Thoughts:  No  Memory:  Immediate;   Fair Recent;   Fair Remote;   Fair  Judgement:  Fair  Insight:  Fair  Psychomotor Activity:  Normal  Concentration:  Concentration: Fair and Attention Span: Fair  Recall:  [employer] of Knowledge:  Fair  Language:  Good  Akathisia:  No  Handed:  Right  AIMS (if indicated):     Assets:  Leisure Time Physical Health Resilience Social Support  ADL's:  Intact  Cognition:  WNL  Sleep:        Treatment Plan Summary: Daily contact with patient to assess and evaluate symptoms and progress in treatment, Medication management and Plan cocaine abuse with cocaine induced mood disorder:  -Crisis stabilization -Medication management:  Increase Risperdal 1 mg at bedtime to BID for psychosis, continue Wellbutrin 150 mg daily for depression, and Remeron 7.5 mg at bedtime for sleep.  Start Lexapro 5 mg daily for depression -Individual and substance abuse counseling  Disposition: Recommend psychiatric Inpatient admission when medically cleared.  Waylan Boga, NP 11/25/2016 5:33 PM  Patient seen face-to-face for psychiatric evaluation, chart reviewed and case discussed with the physician extender and developed treatment plan. Reviewed the information documented and agree with the treatment plan. Corena Pilgrim, MD

## 2016-11-25 NOTE — BHH Counselor (Addendum)
Per Dr. Darleene Cleaver and Waylan Boga, DNP, patient to remain in the ED overnight. Pending am psych evaluation. Patient was however referred to the following facilities: [facility], [facility], and [facility].

## 2016-11-25 NOTE — Progress Notes (Signed)
11/25/16 1400:  LRT went to pt room to offer activities, pt was sleep.  Victorino Sparrow, LRT/CTRS

## 2016-11-26 ENCOUNTER — Encounter (HOSPITAL_COMMUNITY): Payer: Self-pay

## 2016-11-26 ENCOUNTER — Emergency Department (HOSPITAL_COMMUNITY): Payer: Medicare Other

## 2016-11-26 DIAGNOSIS — R05 Cough: Secondary | ICD-10-CM | POA: Insufficient documentation

## 2016-11-26 DIAGNOSIS — Z7902 Long term (current) use of antithrombotics/antiplatelets: Secondary | ICD-10-CM | POA: Diagnosis not present

## 2016-11-26 DIAGNOSIS — Z794 Long term (current) use of insulin: Secondary | ICD-10-CM | POA: Insufficient documentation

## 2016-11-26 DIAGNOSIS — Z79899 Other long term (current) drug therapy: Secondary | ICD-10-CM | POA: Diagnosis not present

## 2016-11-26 DIAGNOSIS — E1122 Type 2 diabetes mellitus with diabetic chronic kidney disease: Secondary | ICD-10-CM | POA: Insufficient documentation

## 2016-11-26 DIAGNOSIS — J45909 Unspecified asthma, uncomplicated: Secondary | ICD-10-CM | POA: Diagnosis not present

## 2016-11-26 DIAGNOSIS — I129 Hypertensive chronic kidney disease with stage 1 through stage 4 chronic kidney disease, or unspecified chronic kidney disease: Secondary | ICD-10-CM | POA: Insufficient documentation

## 2016-11-26 DIAGNOSIS — I2510 Atherosclerotic heart disease of native coronary artery without angina pectoris: Secondary | ICD-10-CM | POA: Insufficient documentation

## 2016-11-26 DIAGNOSIS — F1420 Cocaine dependence, uncomplicated: Secondary | ICD-10-CM | POA: Diagnosis not present

## 2016-11-26 DIAGNOSIS — N183 Chronic kidney disease, stage 3 (moderate): Secondary | ICD-10-CM | POA: Insufficient documentation

## 2016-11-26 DIAGNOSIS — Z7982 Long term (current) use of aspirin: Secondary | ICD-10-CM | POA: Insufficient documentation

## 2016-11-26 DIAGNOSIS — R0602 Shortness of breath: Secondary | ICD-10-CM | POA: Diagnosis not present

## 2016-11-26 LAB — BASIC METABOLIC PANEL
Anion gap: 10 (ref 5–15)
BUN: 36 mg/dL — AB (ref 6–20)
CALCIUM: 9.8 mg/dL (ref 8.9–10.3)
CHLORIDE: 104 mmol/L (ref 101–111)
CO2: 22 mmol/L (ref 22–32)
CREATININE: 1.85 mg/dL — AB (ref 0.61–1.24)
GFR calc Af Amer: 42 mL/min — ABNORMAL LOW (ref >60)
GFR calc non Af Amer: 36 mL/min — ABNORMAL LOW (ref >60)
GLUCOSE: 274 mg/dL — AB (ref 65–99)
Potassium: 4.6 mmol/L (ref 3.5–5.1)
Sodium: 136 mmol/L (ref 135–145)

## 2016-11-26 LAB — CBC
HCT: 31.5 % — ABNORMAL LOW (ref 39.0–52.0)
Hemoglobin: 10.2 g/dL — ABNORMAL LOW (ref 13.0–17.0)
MCH: 26 pg (ref 26.0–34.0)
MCHC: 32.4 g/dL (ref 30.0–36.0)
MCV: 80.4 fL (ref 78.0–100.0)
PLATELETS: 196 10*3/uL (ref 150–400)
RBC: 3.92 MIL/uL — ABNORMAL LOW (ref 4.22–5.81)
RDW: 14 % (ref 11.5–15.5)
WBC: 5.9 10*3/uL (ref 4.0–10.5)

## 2016-11-26 LAB — CBG MONITORING, ED
GLUCOSE-CAPILLARY: 131 mg/dL — AB (ref 65–99)
Glucose-Capillary: 96 mg/dL (ref 65–99)

## 2016-11-26 LAB — I-STAT TROPONIN, ED: TROPONIN I, POC: 0 ng/mL (ref 0.00–0.08)

## 2016-11-26 MED ORDER — ESCITALOPRAM OXALATE 5 MG PO TABS: 5.0000 mg | ORAL_TABLET | Freq: Every day | ORAL | 0 refills | Status: DC

## 2016-11-26 NOTE — ED Triage Notes (Signed)
Pt state he has been coughing up blood x 1 week; pt states seen at [facility] and was giving "pills" to take; pt states he has been still coughing up bright red blood and has become SOB; pt states hx of blood clots lungs; pt a&ox 4 on arrival. Pt c/o left sided chest pain that has been going on x 1 year;

## 2016-11-26 NOTE — BH Assessment (Signed)
[facility] Assessment Progress Note  Per Corena Pilgrim, MD, this pt does not require psychiatric hospitalization at this time.  Pt is to be discharged from [facility] with referral information for area substance abuse treatment providers.  This has been included in pt's discharge instructions.  Pt's nurse, Caryl Pina, has been notified.  She reports that pt is also requesting information for service providers for the homeless, which has also been included in pt's discharge instructions.  Jalene Mullet, [place] Triage Specialist [phone]

## 2016-11-26 NOTE — Discharge Instructions (Signed)
To help you maintain a sober lifestyle, a substance abuse treatment program may be beneficial to you.  Contact one of the following facilities at your earliest opportunity to ask about enrolling:  RESIDENTIAL PROGRAMS:       [facility]      [place], [place] [address]      [phone]       [facility]      [address] [place], [place] [address]      [phone]  OUTPATIENT PROGRAMS:       [facility]      [place], [place] [address]      [phone]   For your shelter needs, contact the following service providers:       [facility]      [place] [address]      [phone]       [facility]      [address]      [place], [place] [address]      [phone]  For day shelter and other supportive services for the homeless, contact the [facility]):       [facility]      [place], [place] [address]      [phone]  For transitional housing, contact one of the following agencies.  They provide longer term housing than a shelter, but there is an application process:       [facility]      [address]      [place], [place] [address]      [phone]       [facility] [address]  [address]      [phone]

## 2016-11-26 NOTE — Consult Note (Signed)
[facility] Consult   Reason for Consult:  Substance abuse with suicidal ideations and hallucinations Referring Physician:  EDP Patient Identification: Michael [name] MRN:  [id number] Principal Diagnosis: Cocaine dependence with cocaine-induced mood disorder [facility]) Diagnosis:   Patient Active Problem List   Diagnosis Date Noted  . Cocaine dependence with cocaine-induced mood disorder ([place]) [F14.24]     Priority: High  . Cocaine use disorder, severe, dependence ([place]) [F14.20] 12/13/2014    Priority: High  . Substance induced mood disorder ([place]) [F19.94] 11/23/2016  . Back pain [M54.9] 04/18/2016  . S/P carotid endarterectomy [Z98.890] 11/15/2015  . MDD (major depressive disorder), recurrent severe, without psychosis ([place]) [F33.2] 09/09/2015  . Cocaine-induced mood disorder ([place]) [F14.94] 08/14/2015  . Gout [M10.9] 07/10/2015  . Cough [R05] 04/26/2015  . Lactic acidosis [E87.2] 03/06/2015  . CKD (chronic kidney disease) stage 3, GFR 30-59 ml/min [N18.3] 03/06/2015  . Normocytic anemia [D64.9] 03/06/2015  . Hypoglycemia [E16.2]   . Encounter for general adult medical examination with abnormal findings [Z00.01] 02/09/2015  . Substance or medication-induced depressive disorder with onset during withdrawal ([place]) [F19.94] 12/13/2014  . Cervicalgia [M54.2] 06/28/2014  . Lumbar radiculopathy, chronic [M54.16] 06/28/2014  . Asthma, chronic [J45.909] 02/03/2014  . 3-vessel CAD [I25.10] 06/24/2013  . ED (erectile dysfunction) of organic origin [N52.9] 07/07/2012  . Hypertension goal BP (blood pressure) < 140/80 [I10] 04/29/2012  . Chondromalacia of left knee [M94.262] 03/19/2012  . Hyperlipidemia with target LDL less than 100 [E78.5] 02/12/2012  . Fibromyalgia [M79.7] 02/12/2012  . HIV (human immunodeficiency virus infection) ([place]) [B20] 08/27/2011  . Uncontrolled type 2 diabetes with neuropathy ([place]) [E11.40, E11.65] 10/17/2000    Total Time spent with patient: 30  minutes  Subjective:   Michael [name] is a 66 y.o. male patient has stabilized.  HPI:  66 yo male who presented to the ED after using cocaine and having suicidal ideations with hallucinations.  He was started on medications and stabilized.  No suicidal/homicidal ideations, hallucinations, and withdrawal symptoms.  Stable for discharge.  Past Psychiatric History: substance abuse, suicidal ideations  Risk to Self: None Risk to Others: None Prior Inpatient Therapy: Prior Inpatient Therapy: Yes Prior Therapy Dates: 2017 Prior Therapy Facilty/Provider(s): [facility] Reason for Treatment: suicide attempts.  Prior Outpatient Therapy: Prior Outpatient Therapy: No Prior Therapy Dates: Na Prior Therapy Facilty/Provider(s): NA Reason for Treatment: NA Does patient have an ACCT team?: No Does patient have Intensive In-House Services?  : No Does patient have [facility] services? : No Does patient have P4CC services?: No  Past Medical History:  Past Medical History:  Diagnosis Date  . A-fib ([place])   . Asthma    No PFTs, history of childhood asthma  . CAD (coronary artery disease)    a. 06/2013 STEMI/PCI ([facility]): LAD w/ thrombus (treated with BMS), mid 75%, D2 75%; LCX OM2 75%; RCA small, PDA 95%, PLV 95%;  b. 10/2013 Cath/PCI: ISR w/in LAD (Promus DES x 2), borderline OM2 lesion;  c. 01/2014 MV: Intermediate risk, medium-sized distal ant wall infarct w/ very small amt of peri-infarct ischemia. EF 60%.  . Cellulitis 04/2014   left facial  . Chondromalacia of medial femoral condyle    Left knee MRI 04/28/12: Chondromalacia of the medial femoral condyle with slight peripheral degeneration of the meniscocapsular junction of the medial meniscus; followed by sports medicine  . Collagen vascular disease ([place])   . Crack cocaine use    for 20+ years, has been enrolled in detox programs in the past  .  Depression    with history of hospitalization for suicidal ideation  . Diabetes mellitus 2002   Diagnosed in  2002, started insulin in 2012  . Gout   . Gout 04/28/2012  . Headache(784.0)    CT head 08/2011: Periventricular and subcortical white matter hypodensities are most in keeping with chronic microangiopathic change  . HIV infection [facility]) Nov 2012   Followed by [name]  . Hyperlipidemia   . Hypertension   . Pulmonary embolism [facility])     Past Surgical History:  Procedure Laterality Date  . BACK SURGERY     1988  . BOWEL RESECTION    . CARDIAC SURGERY    . CERVICAL SPINE SURGERY     " rods in my neck "  . CORONARY STENT PLACEMENT    . NM MYOCAR PERF WALL MOTION  12/27/2011   normal  . SPINE SURGERY     Family History:  Family History  Problem Relation Age of Onset  . Diabetes Mother   . Hypertension Mother   . Hyperlipidemia Mother   . Diabetes Father   . Cancer Father   . Hypertension Father   . Diabetes Brother   . Heart disease Brother   . Diabetes Sister   . Colon cancer Neg Hx    Family Psychiatric  History: none Social History:  History  Alcohol Use No     History  Drug Use  . Types: "Crack" cocaine, Cocaine    Comment: last used crack 08/2015.     Social History   Social History  . Marital status: Divorced    Spouse name: N/A  . Number of children: N/A  . Years of education: 73   Occupational History  .  Unemployed    04/2016   Social History Main Topics  . Smoking status: Never Smoker  . Smokeless tobacco: Never Used  . Alcohol use No  . Drug use: Yes    Types: "Crack" cocaine, Cocaine     Comment: last used crack 08/2015.   Marland Kitchen Sexual activity: Yes     Comment: accepted condoms   Other Topics Concern  . None   Social History Narrative   Currently staying with a friend in [place].  Was staying @ local motel until a few days ago - left b/c of bed bugs.   Additional Social History:    Allergies:  No Known Allergies  Labs:  Results for orders placed or performed during the hospital encounter of 11/22/16 (from the past 48 hour(s))  CBG  monitoring, ED     Status: Abnormal   Collection Time: 11/24/16 12:42 PM  Result Value Ref Range   Glucose-Capillary 143 (H) 65 - 99 mg/dL  CBG monitoring, ED     Status: Abnormal   Collection Time: 11/24/16  5:52 PM  Result Value Ref Range   Glucose-Capillary 110 (H) 65 - 99 mg/dL  CBG monitoring, ED     Status: Abnormal   Collection Time: 11/24/16  9:33 PM  Result Value Ref Range   Glucose-Capillary 119 (H) 65 - 99 mg/dL  CBG monitoring, ED     Status: Abnormal   Collection Time: 11/25/16  7:30 AM  Result Value Ref Range   Glucose-Capillary 123 (H) 65 - 99 mg/dL  CBG monitoring, ED     Status: None   Collection Time: 11/25/16 12:28 PM  Result Value Ref Range   Glucose-Capillary 88 65 - 99 mg/dL  CBG monitoring, ED     Status: Abnormal  Collection Time: 11/25/16  5:26 PM  Result Value Ref Range   Glucose-Capillary 207 (H) 65 - 99 mg/dL  CBG monitoring, ED     Status: Abnormal   Collection Time: 11/26/16  7:45 AM  Result Value Ref Range   Glucose-Capillary 131 (H) 65 - 99 mg/dL  CBG monitoring, ED     Status: None   Collection Time: 11/26/16 12:18 PM  Result Value Ref Range   Glucose-Capillary 96 65 - 99 mg/dL    Current Facility-Administered Medications  Medication Dose Route Frequency Provider Last Rate Last Dose  . albuterol (PROVENTIL HFA;VENTOLIN HFA) 108 (90 Base) MCG/ACT inhaler 2 puff  2 puff Inhalation Q6H PRN Davonna Belling, MD   2 puff at 11/24/16 1242  . allopurinol (ZYLOPRIM) tablet 200 mg  200 mg Oral Daily Davonna Belling, MD   200 mg at 11/26/16 0915  . amantadine (SYMMETREL) capsule 100 mg  100 mg Oral Daily Davonna Belling, MD   100 mg at 11/26/16 0914  . aspirin EC tablet 81 mg  81 mg Oral Daily Davonna Belling, MD   81 mg at 11/26/16 0914  . buPROPion (WELLBUTRIN XL) 24 hr tablet 150 mg  150 mg Oral Daily Davonna Belling, MD   150 mg at 11/26/16 0916  . cyclobenzaprine (FLEXERIL) tablet 10 mg  10 mg Oral TID PRN Davonna Belling, MD   10 mg at  11/22/16 2116  . diltiazem (CARDIZEM CD) 24 hr capsule 120 mg  120 mg Oral Daily Davonna Belling, MD   120 mg at 11/26/16 0916  . emtricitabine-rilpivir-tenofovir AF (ODEFSEY) 200-25-25 MG per tablet 1 tablet  1 tablet Oral Q breakfast Davonna Belling, MD   1 tablet at 11/26/16 [phone]  . escitalopram (LEXAPRO) tablet 5 mg  5 mg Oral Daily Patrecia Pour, NP   5 mg at 11/26/16 0915  . famotidine (PEPCID) tablet 20 mg  20 mg Oral Daily Davonna Belling, MD   20 mg at 11/26/16 0914  . gabapentin (NEURONTIN) capsule 400 mg  400 mg Oral TID Davonna Belling, MD   400 mg at 11/26/16 0915  . glipiZIDE (GLUCOTROL XL) 24 hr tablet 5 mg  5 mg Oral Q breakfast Davonna Belling, MD   5 mg at 11/26/16 0813  . insulin aspart (novoLOG) injection 3 Units  3 Units Subcutaneous TID [facility] Carmin Muskrat, MD   3 Units at 11/26/16 [phone]  . insulin glargine (LANTUS) injection 25 Units  25 Units Subcutaneous QHS Davonna Belling, MD   25 Units at 11/25/16 2211  . isosorbide mononitrate (IMDUR) 24 hr tablet 30 mg  30 mg Oral Daily Davonna Belling, MD   30 mg at 11/26/16 0914  . losartan (COZAAR) tablet 50 mg  50 mg Oral Daily Davonna Belling, MD   50 mg at 11/26/16 0916  . metFORMIN (GLUCOPHAGE) tablet 500 mg  500 mg Oral BID AC Davonna Belling, MD   500 mg at 11/26/16 9798  . metoprolol succinate (TOPROL-XL) 24 hr tablet 25 mg  25 mg Oral Daily Davonna Belling, MD   25 mg at 11/26/16 0916  . mirtazapine (REMERON) tablet 7.5 mg  7.5 mg Oral QHS Davonna Belling, MD   7.5 mg at 11/25/16 2212  . pravastatin (PRAVACHOL) tablet 40 mg  40 mg Oral QHS Davonna Belling, MD   40 mg at 11/25/16 2212  . risperiDONE (RISPERDAL) tablet 1 mg  1 mg Oral BID Patrecia Pour, NP   1 mg at 11/26/16 9211  . Rivaroxaban (XARELTO)  tablet 15 mg  15 mg Oral BID WC Davonna Belling, MD   15 mg at 11/26/16 0813   Followed by  . [START ON 12/13/2016] rivaroxaban (XARELTO) tablet 20 mg  20 mg Oral Q supper Davonna Belling, MD       Current  Outpatient Prescriptions  Medication Sig Dispense Refill  . albuterol (PROAIR HFA) 108 (90 Base) MCG/ACT inhaler Inhale 2 puffs into the lungs every 6 (six) hours as needed for wheezing or shortness of breath. 1 Inhaler 0  . allopurinol (ZYLOPRIM) 100 MG tablet Take 200 mg by mouth daily.    Marland Kitchen amantadine (SYMMETREL) 100 MG capsule Take 100 mg by mouth daily.     Marland Kitchen aspirin EC 81 MG tablet Take 81 mg by mouth daily.    Marland Kitchen buPROPion (WELLBUTRIN XL) 150 MG 24 hr tablet Take 150 mg by mouth daily.    . clopidogrel (PLAVIX) 75 MG tablet Take 75 mg by mouth daily.    . cyclobenzaprine (FLEXERIL) 10 MG tablet Take 10 mg by mouth 3 (three) times daily as needed for muscle spasms.    . diclofenac sodium (VOLTAREN) 1 % GEL Apply 2 g topically 4 (four) times daily as needed (for pain).    Marland Kitchen diltiazem (CARDIZEM CD) 120 MG 24 hr capsule Take 1 capsule (120 mg total) by mouth daily. 30 capsule 0  . emtricitabine-rilpivir-tenofovir DF (COMPLERA) 200-25-300 MG tablet Take 1 tablet by mouth daily. 30 tablet 0  . famotidine (PEPCID) 20 MG tablet Take 20 mg by mouth daily.    Marland Kitchen gabapentin (NEURONTIN) 400 MG capsule Take 400 mg by mouth 3 (three) times daily.    Marland Kitchen glipiZIDE (GLUCOTROL XL) 5 MG 24 hr tablet Take 5 mg by mouth daily with breakfast.    . insulin aspart (NOVOLOG) 100 UNIT/ML injection Inject 2-4 Units into the skin 3 (three) times daily before meals. Pt uses per sliding scale.    . insulin glargine (LANTUS) 100 unit/mL SOPN Inject 25 Units into the skin at bedtime.    . isosorbide mononitrate (IMDUR) 30 MG 24 hr tablet Take 1 tablet (30 mg total) by mouth daily. 30 tablet 0  . losartan (COZAAR) 50 MG tablet Take 1 tablet (50 mg total) by mouth daily. 14 tablet 0  . metFORMIN (GLUCOPHAGE) 500 MG tablet Take 500 mg by mouth 2 (two) times daily before a meal.    . metoprolol succinate (TOPROL-XL) 25 MG 24 hr tablet Take 1 tablet (25 mg total) by mouth daily. 30 tablet 0  . mirtazapine (REMERON) 7.5 MG  tablet Take 7.5 mg by mouth at bedtime.    . pravastatin (PRAVACHOL) 40 MG tablet Take 40 mg by mouth at bedtime.    . risperiDONE (RISPERDAL) 1 MG tablet Take 1 tablet (1 mg total) by mouth at bedtime. 30 tablet 0    Musculoskeletal: Strength & Muscle Tone: within normal limits Gait & Station: normal Patient leans: N/A  Psychiatric Specialty Exam: Physical Exam  Constitutional: He is oriented to person, place, and time. He appears well-developed and well-nourished.  HENT:  Head: Normocephalic.  Neck: Normal range of motion.  Respiratory: Effort normal.  Musculoskeletal: Normal range of motion.  Neurological: He is alert and oriented to person, place, and time.  Psychiatric: He has a normal mood and affect. His speech is normal and behavior is normal. Judgment and thought content normal. Cognition and memory are normal.    Review of Systems  Psychiatric/Behavioral: Positive for substance abuse.  All other  systems reviewed and are negative.   Blood pressure 102/66, pulse 71, temperature 98 F (36.7 C), temperature source Oral, resp. rate 20, height 5\' 7"  (1.702 m), weight 78.9 kg (174 lb), SpO2 100 %.Body mass index is 27.25 kg/m.  General Appearance: Casual  Eye Contact:  Good  Speech:  Normal Rate  Volume:  Normal  Mood:  Euthymic  Affect:  Congruent  Thought Process:  Coherent and Descriptions of Associations: Intact  Orientation:  Full (Time, Place, and Person)  Thought Content:  WDL and Logical  Suicidal Thoughts:  No  Homicidal Thoughts:  No  Memory:  Immediate;   Good Recent;   Good Remote;   Good  Judgement:  Fair  Insight:  Fair  Psychomotor Activity:  Normal  Concentration:  Concentration: Good and Attention Span: Good  Recall:  Good  Fund of Knowledge:  Fair  Language:  Good  Akathisia:  No  Handed:  Right  AIMS (if indicated):     Assets:  Housing Leisure Time Physical Health Resilience  ADL's:  Intact  Cognition:  WNL  Sleep:        Treatment  Plan Summary: Daily contact with patient to assess and evaluate symptoms and progress in treatment, Medication management and Plan cocaine abuse with cocaine induced mood disorder:  -Crisis stabilization -Medication management: Restart medical medications along with Wellbutrin 300 mg daily for depression, Remeron 7.5 mg at bedtime for sleep, Gabapentin 400 mg TID for cocaine withdrawal, and Lexapro 5 mg daily for depression -Individual and substance abuse counseling  Disposition: No evidence of imminent risk to self or others at present.    Waylan Boga, NP 11/26/2016 12:25 PM  Patient seen face-to-face for psychiatric evaluation, chart reviewed and case discussed with the physician extender and developed treatment plan. Reviewed the information documented and agree with the treatment plan. Corena Pilgrim, MD

## 2016-11-26 NOTE — [facility] Suicide Risk Assessment (Signed)
Suicide Risk Assessment  Discharge Assessment   [facility] Discharge Suicide Risk Assessment   Principal Problem: Cocaine dependence with cocaine-induced mood disorder [facility]) Discharge Diagnoses:  Patient Active Problem List   Diagnosis Date Noted  . Cocaine dependence with cocaine-induced mood disorder ([place]) [F14.24]     Priority: High  . Cocaine use disorder, severe, dependence ([place]) [F14.20] 12/13/2014    Priority: High  . Substance induced mood disorder ([place]) [F19.94] 11/23/2016  . Back pain [M54.9] 04/18/2016  . S/P carotid endarterectomy [Z98.890] 11/15/2015  . MDD (major depressive disorder), recurrent severe, without psychosis ([place]) [F33.2] 09/09/2015  . Cocaine-induced mood disorder ([place]) [F14.94] 08/14/2015  . Gout [M10.9] 07/10/2015  . Cough [R05] 04/26/2015  . Lactic acidosis [E87.2] 03/06/2015  . CKD (chronic kidney disease) stage 3, GFR 30-59 ml/min [N18.3] 03/06/2015  . Normocytic anemia [D64.9] 03/06/2015  . Hypoglycemia [E16.2]   . Encounter for general adult medical examination with abnormal findings [Z00.01] 02/09/2015  . Substance or medication-induced depressive disorder with onset during withdrawal ([place]) [F19.94] 12/13/2014  . Cervicalgia [M54.2] 06/28/2014  . Lumbar radiculopathy, chronic [M54.16] 06/28/2014  . Asthma, chronic [J45.909] 02/03/2014  . 3-vessel CAD [I25.10] 06/24/2013  . ED (erectile dysfunction) of organic origin [N52.9] 07/07/2012  . Hypertension goal BP (blood pressure) < 140/80 [I10] 04/29/2012  . Chondromalacia of left knee [M94.262] 03/19/2012  . Hyperlipidemia with target LDL less than 100 [E78.5] 02/12/2012  . Fibromyalgia [M79.7] 02/12/2012  . HIV (human immunodeficiency virus infection) ([place]) [B20] 08/27/2011  . Uncontrolled type 2 diabetes with neuropathy ([place]) [E11.40, E11.65] 10/17/2000    Total Time spent with patient: 30 minutes   Musculoskeletal: Strength & Muscle Tone: within normal limits Gait & Station: normal Patient  leans: N/A  Psychiatric Specialty Exam: Physical Exam  Constitutional: He is oriented to person, place, and time. He appears well-developed and well-nourished.  HENT:  Head: Normocephalic.  Neck: Normal range of motion.  Respiratory: Effort normal.  Musculoskeletal: Normal range of motion.  Neurological: He is alert and oriented to person, place, and time.  Psychiatric: He has a normal mood and affect. His speech is normal and behavior is normal. Judgment and thought content normal. Cognition and memory are normal.    Review of Systems  Psychiatric/Behavioral: Positive for substance abuse.  All other systems reviewed and are negative.   Blood pressure 102/66, pulse 71, temperature 98 F (36.7 C), temperature source Oral, resp. rate 20, height 5\' 7"  (1.702 m), weight 78.9 kg (174 lb), SpO2 100 %.Body mass index is 27.25 kg/m.  General Appearance: Casual  Eye Contact:  Good  Speech:  Normal Rate  Volume:  Normal  Mood:  Euthymic  Affect:  Congruent  Thought Process:  Coherent and Descriptions of Associations: Intact  Orientation:  Full (Time, Place, and Person)  Thought Content:  WDL and Logical  Suicidal Thoughts:  No  Homicidal Thoughts:  No  Memory:  Immediate;   Good Recent;   Good Remote;   Good  Judgement:  Fair  Insight:  Fair  Psychomotor Activity:  Normal  Concentration:  Concentration: Good and Attention Span: Good  Recall:  Good  Fund of Knowledge:  Fair  Language:  Good  Akathisia:  No  Handed:  Right  AIMS (if indicated):     Assets:  Housing Leisure Time Physical Health Resilience  ADL's:  Intact  Cognition:  WNL  Sleep:       Mental Status Per Nursing Assessment::   On Admission:   cocaine abuse with suicidal  ideations  Demographic Factors:  Male and Age 21 or older  Loss Factors: NA  Historical Factors: NA  Risk Reduction Factors:   Positive social support and Positive therapeutic relationship  Continued Clinical Symptoms:   None  Cognitive Features That Contribute To Risk:  None    Suicide Risk:  Minimal: No identifiable suicidal ideation.  Patients presenting with no risk factors but with morbid ruminations; may be classified as minimal risk based on the severity of the depressive symptoms    Plan Of Care/Follow-up recommendations:  Activity:  as tolerated  Diet:  heart healthy diet  Zaidan Keeble, NP 11/26/2016, 1:44 PM

## 2016-11-26 NOTE — ED Notes (Signed)
Pt d/c home per MD order. Discharge summary reviewed with pt. Pt verbalizes understanding of discharge summary and resources. RX provided. Pt denies SI/HI. Pt signed for personal property and property returned. Pt signed e-signature. Ambulatory off unit.

## 2016-11-27 ENCOUNTER — Encounter: Payer: Self-pay | Admitting: Internal Medicine

## 2016-11-27 ENCOUNTER — Emergency Department (HOSPITAL_COMMUNITY)
Admission: EM | Admit: 2016-11-27 | Discharge: 2016-11-27 | Disposition: A | Discharge status: Home / Self Care | Payer: Medicare Other | Attending: Emergency Medicine | Admitting: Emergency Medicine

## 2016-11-27 DIAGNOSIS — R059 Cough, unspecified: Secondary | ICD-10-CM

## 2016-11-27 DIAGNOSIS — R05 Cough: Secondary | ICD-10-CM

## 2016-11-27 LAB — D-DIMER, QUANTITATIVE: D-Dimer, Quant: 0.31 ug/mL-FEU (ref 0.00–0.50)

## 2016-11-27 NOTE — Discharge Instructions (Signed)
YOUR LAB STUDIES AND CHEST X-RAY ARE NEGATIVE HERE FOR ANY SIGN OF INFECTION OR CONTINUED CLOT. YOU CAN BE DISCHARGED HOME AND ARE ENCOURAGED TO CALL YOUR DOCTOR FOR RECHECK OF PERSISTENT COUGH NEXT WEEK.

## 2016-11-27 NOTE — ED Notes (Signed)
Lab contacted to add on d-dimer 

## 2016-11-27 NOTE — ED Provider Notes (Signed)
[place] DEPT Provider Note   CSN: [id number] Arrival date & time: 11/26/16  2108     History   Chief Complaint Chief Complaint  Patient presents with  . Hematemesis  . Cough  . Shortness of Breath    HPI Michael [name] is a 66 y.o. male.  Patient with history of recent dx PE ([facility]), DM, HTN, CAD, CKD, asthma, cocaine dependence (continuous), medication noncompliance, presents with c/o cough productive of bloody sputum, SOB, without fever or chest pain. Patient is a vague historian but consistently reports that the cough with hematemesis has been ongoing since prior to his diagnosis of PE earlier this month. He states that he never filled any of the multiple prescriptions written for Xarelto.    The history is provided by the patient. No language interpreter was used.  Cough  Associated symptoms include shortness of breath. Pertinent negatives include no chest pain, no chills and no myalgias.  Shortness of Breath  Associated symptoms include cough. Pertinent negatives include no fever, no chest pain and no vomiting.    Past Medical History:  Diagnosis Date  . A-fib ([place])   . Asthma    No PFTs, history of childhood asthma  . CAD (coronary artery disease)    a. 06/2013 STEMI/PCI ([facility]): LAD w/ thrombus (treated with BMS), mid 75%, D2 75%; LCX OM2 75%; RCA small, PDA 95%, PLV 95%;  b. 10/2013 Cath/PCI: ISR w/in LAD (Promus DES x 2), borderline OM2 lesion;  c. 01/2014 MV: Intermediate risk, medium-sized distal ant wall infarct w/ very small amt of peri-infarct ischemia. EF 60%.  . Cellulitis 04/2014   left facial  . Chondromalacia of medial femoral condyle    Left knee MRI 04/28/12: Chondromalacia of the medial femoral condyle with slight peripheral degeneration of the meniscocapsular junction of the medial meniscus; followed by sports medicine  . Collagen vascular disease ([place])   . Crack cocaine use    for 20+ years, has been enrolled in detox programs in the past  .  Depression    with history of hospitalization for suicidal ideation  . Diabetes mellitus 2002   Diagnosed in 2002, started insulin in 2012  . Gout   . Gout 04/28/2012  . Headache(784.0)    CT head 08/2011: Periventricular and subcortical white matter hypodensities are most in keeping with chronic microangiopathic change  . HIV infection [facility]) Nov 2012   Followed by [name]  . Hyperlipidemia   . Hypertension   . Pulmonary embolism [facility])     Patient Active Problem List   Diagnosis Date Noted  . Substance induced mood disorder ([place]) 11/23/2016  . Back pain 04/18/2016  . S/P carotid endarterectomy 11/15/2015  . MDD (major depressive disorder), recurrent severe, without psychosis ([place]) 09/09/2015  . Cocaine dependence with cocaine-induced mood disorder ([place])   . Cocaine-induced mood disorder ([place]) 08/14/2015  . Gout 07/10/2015  . Cough 04/26/2015  . Lactic acidosis 03/06/2015  . CKD (chronic kidney disease) stage 3, GFR 30-59 ml/min 03/06/2015  . Normocytic anemia 03/06/2015  . Hypoglycemia   . Encounter for general adult medical examination with abnormal findings 02/09/2015  . Cocaine use disorder, severe, dependence ([place]) 12/13/2014  . Substance or medication-induced depressive disorder with onset during withdrawal ([place]) 12/13/2014  . Cervicalgia 06/28/2014  . Lumbar radiculopathy, chronic 06/28/2014  . Asthma, chronic 02/03/2014  . 3-vessel CAD 06/24/2013  . ED (erectile dysfunction) of organic origin 07/07/2012  . Hypertension goal BP (blood pressure) < 140/80 04/29/2012  . Chondromalacia  of left knee 03/19/2012  . Hyperlipidemia with target LDL less than 100 02/12/2012  . Fibromyalgia 02/12/2012  . HIV (human immunodeficiency virus infection) ([place]) 08/27/2011  . Uncontrolled type 2 diabetes with neuropathy ([place]) 10/17/2000    Past Surgical History:  Procedure Laterality Date  . BACK SURGERY     1988  . BOWEL RESECTION    . CARDIAC SURGERY    . CERVICAL SPINE  SURGERY     " rods in my neck "  . CORONARY STENT PLACEMENT    . NM MYOCAR PERF WALL MOTION  12/27/2011   normal  . SPINE SURGERY         Home Medications    Prior to Admission medications   Medication Sig Start Date End Date Taking? Authorizing Provider  albuterol (PROAIR HFA) 108 (90 Base) MCG/ACT inhaler Inhale 2 puffs into the lungs every 6 (six) hours as needed for wheezing or shortness of breath. 04/10/16   Norman Herrlich, MD  allopurinol (ZYLOPRIM) 100 MG tablet Take 200 mg by mouth daily.    Historical Provider, MD  amantadine (SYMMETREL) 100 MG capsule Take 100 mg by mouth daily.     Historical Provider, MD  aspirin EC 81 MG tablet Take 81 mg by mouth daily.    Historical Provider, MD  buPROPion (WELLBUTRIN XL) 150 MG 24 hr tablet Take 150 mg by mouth daily.    Historical Provider, MD  clopidogrel (PLAVIX) 75 MG tablet Take 75 mg by mouth daily.    Historical Provider, MD  cyclobenzaprine (FLEXERIL) 10 MG tablet Take 10 mg by mouth 3 (three) times daily as needed for muscle spasms.    Historical Provider, MD  diclofenac sodium (VOLTAREN) 1 % GEL Apply 2 g topically 4 (four) times daily as needed (for pain).    Historical Provider, MD  diltiazem (CARDIZEM CD) 120 MG 24 hr capsule Take 1 capsule (120 mg total) by mouth daily. 04/10/16   Norman Herrlich, MD  emtricitabine-rilpivir-tenofovir DF (COMPLERA) 200-25-300 MG tablet Take 1 tablet by mouth daily. 04/10/16   Norman Herrlich, MD  escitalopram (LEXAPRO) 5 MG tablet Take 1 tablet (5 mg total) by mouth daily. 11/27/16   Patrecia Pour, NP  famotidine (PEPCID) 20 MG tablet Take 20 mg by mouth daily.    Historical Provider, MD  gabapentin (NEURONTIN) 400 MG capsule Take 400 mg by mouth 3 (three) times daily.    Historical Provider, MD  glipiZIDE (GLUCOTROL XL) 5 MG 24 hr tablet Take 5 mg by mouth daily with breakfast.    Historical Provider, MD  insulin aspart (NOVOLOG) 100 UNIT/ML injection Inject 2-4 Units into the skin 3 (three) times  daily before meals. Pt uses per sliding scale.    Historical Provider, MD  insulin glargine (LANTUS) 100 unit/mL SOPN Inject 25 Units into the skin at bedtime.    Historical Provider, MD  isosorbide mononitrate (IMDUR) 30 MG 24 hr tablet Take 1 tablet (30 mg total) by mouth daily. 04/10/16   Norman Herrlich, MD  losartan (COZAAR) 50 MG tablet Take 1 tablet (50 mg total) by mouth daily. 04/10/16   Norman Herrlich, MD  metFORMIN (GLUCOPHAGE) 500 MG tablet Take 500 mg by mouth 2 (two) times daily before a meal.    Historical Provider, MD  metoprolol succinate (TOPROL-XL) 25 MG 24 hr tablet Take 1 tablet (25 mg total) by mouth daily. 04/10/16   Norman Herrlich, MD  mirtazapine (REMERON) 7.5 MG tablet Take 7.5 mg  by mouth at bedtime.    Historical Provider, MD  pravastatin (PRAVACHOL) 40 MG tablet Take 40 mg by mouth at bedtime.    Historical Provider, MD  risperiDONE (RISPERDAL) 1 MG tablet Take 1 tablet (1 mg total) by mouth at bedtime. 04/10/16   Norman Herrlich, MD    Family History Family History  Problem Relation Age of Onset  . Diabetes Mother   . Hypertension Mother   . Hyperlipidemia Mother   . Diabetes Father   . Cancer Father   . Hypertension Father   . Diabetes Brother   . Heart disease Brother   . Diabetes Sister   . Colon cancer Neg Hx     Social History Social History  Substance Use Topics  . Smoking status: Never Smoker  . Smokeless tobacco: Never Used  . Alcohol use No     Allergies   Patient has no known allergies.   Review of Systems Review of Systems  Constitutional: Negative for chills and fever.  HENT: Negative.   Respiratory: Positive for cough and shortness of breath.        +hematemesis.  Cardiovascular: Negative.  Negative for chest pain.  Gastrointestinal: Negative.  Negative for nausea and vomiting.  Musculoskeletal: Negative.  Negative for myalgias.  Skin: Negative.   Neurological: Negative.      Physical Exam Updated Vital Signs BP 107/69   Pulse  67   Temp 98.3 F (36.8 C) (Oral)   Resp 17   SpO2 98%   Physical Exam  Constitutional: He is oriented to person, place, and time. He appears well-developed and well-nourished.  HENT:  Head: Normocephalic.  Neck: Normal range of motion. Neck supple.  Cardiovascular: Normal rate and regular rhythm.   No murmur heard. Pulmonary/Chest: Effort normal and breath sounds normal. He has no wheezes. He has no rales. He exhibits no tenderness.  Abdominal: Soft. Bowel sounds are normal. There is no tenderness. There is no rebound and no guarding.  Musculoskeletal: Normal range of motion.  Neurological: He is alert and oriented to person, place, and time.  Skin: Skin is warm and dry. No rash noted.  Psychiatric: He has a normal mood and affect.     ED Treatments / Results  Labs (all labs ordered are listed, but only abnormal results are displayed) Labs Reviewed  BASIC METABOLIC PANEL - Abnormal; Notable for the following:       Result Value   Glucose, Bld 274 (*)    BUN 36 (*)    Creatinine, Ser 1.85 (*)    GFR calc non Af Amer 36 (*)    GFR calc Af Amer 42 (*)    All other components within normal limits  CBC - Abnormal; Notable for the following:    RBC 3.92 (*)    Hemoglobin 10.2 (*)    HCT 31.5 (*)    All other components within normal limits  D-DIMER, QUANTITATIVE (NOT AT [facility])  I-STAT TROPOININ, ED   Results for orders placed or performed during the hospital encounter of 25/36/64  Basic metabolic panel  Result Value Ref Range   Sodium 136 135 - 145 mmol/L   Potassium 4.6 3.5 - 5.1 mmol/L   Chloride 104 101 - 111 mmol/L   CO2 22 22 - 32 mmol/L   Glucose, Bld 274 (H) 65 - 99 mg/dL   BUN 36 (H) 6 - 20 mg/dL   Creatinine, Ser 1.85 (H) 0.61 - 1.24 mg/dL   Calcium 9.8 8.9 - 10.3  mg/dL   GFR calc non Af Amer 36 (L) >60 mL/min   GFR calc Af Amer 42 (L) >60 mL/min   Anion gap 10 5 - 15  CBC  Result Value Ref Range   WBC 5.9 4.0 - 10.5 K/uL   RBC 3.92 (L) 4.22 - 5.81 MIL/uL    Hemoglobin 10.2 (L) 13.0 - 17.0 g/dL   HCT 31.5 (L) 39.0 - 52.0 %   MCV 80.4 78.0 - 100.0 fL   MCH 26.0 26.0 - 34.0 pg   MCHC 32.4 30.0 - 36.0 g/dL   RDW 14.0 11.5 - 15.5 %   Platelets 196 150 - 400 K/uL  D-dimer, quantitative (not at [facility])  Result Value Ref Range   D-Dimer, Quant 0.31 0.00 - 0.50 ug/mL-FEU  I-stat troponin, ED  Result Value Ref Range   Troponin i, poc 0.00 0.00 - 0.08 ng/mL   Comment 3            EKG  EKG Interpretation None       Radiology Dg Chest 2 View  Result Date: 11/26/2016 CLINICAL DATA:  66 year old male with shortness of breath EXAM: CHEST  2 VIEW COMPARISON:  Chest radiograph dated 11/22/2016 FINDINGS: The lungs are clear. There is no pleural effusion or pneumothorax. The cardiac silhouette is within normal limits. A coronary bypass graft is noted. Anterior cervical fusion hardware. Degenerative changes of the spine. No acute osseous pathology. IMPRESSION: No active cardiopulmonary disease. Electronically Signed   By: Anner Crete M.D.   On: 11/26/2016 21:52    Procedures Procedures (including critical care time)  Medications Ordered in ED Medications - No data to display   Initial Impression / Assessment and Plan / ED Course  I have reviewed the triage vital signs and the nursing notes.  Pertinent labs & imaging results that were available during my care of the patient were reviewed by me and considered in my medical decision making (see chart for details).     Patient presents with concern for hematemesis and persistent, productive cough x >1 month. No fever or chest pain. He has a previous recent diagnosis of PE while at [facility]) in early February and multiple hospital visits since that time.   Here the patient is quiet, in NAD, normal vital signs including normal rate, no hypoxia or tachypnea. He endorses ongoing cocaine dependence. Has never had his Xarelto Rx filled. He produces sputum while coughing here which is  observed to be non-bloody.  He has a history of suicidal ideations, past attempts and depression. He denies SI tonight and feels stable from a psychologic standpoint.   Records reviewed. CTA 11/07/16 positive for LLL PE. CTA 11/13/16 negative for PE. Past CTA negative for PE. CXR tonight is clear of infiltrates, effusions, or PTX. He is in no respiratory distress. D-dimer ordered for complete evaluation.   D-dimer is 0.31, negative. Low suspicion for PE. He appears stable and comfortable. He can be discharged home to follow up with PCP in outpatient setting for persistent cough.  Final Clinical Impressions(s) / ED Diagnoses   Final diagnoses:  None   1. Cough 2. Cocaine dependence  New Prescriptions New Prescriptions   No medications on file     Charlann Lange, PA-C 11/27/16 [place], MD 11/27/16 [phone]

## 2016-12-03 ENCOUNTER — Encounter: Payer: Self-pay | Admitting: Internal Medicine

## 2016-12-08 ENCOUNTER — Emergency Department (HOSPITAL_COMMUNITY): Payer: Medicare Other

## 2016-12-08 ENCOUNTER — Emergency Department (HOSPITAL_COMMUNITY)
Admission: EM | Admit: 2016-12-08 | Discharge: 2016-12-09 | Disposition: A | Discharge status: Home / Self Care | Payer: Medicare Other | Attending: Emergency Medicine | Admitting: Emergency Medicine

## 2016-12-08 ENCOUNTER — Encounter (HOSPITAL_COMMUNITY): Payer: Self-pay

## 2016-12-08 DIAGNOSIS — Z7984 Long term (current) use of oral hypoglycemic drugs: Secondary | ICD-10-CM | POA: Insufficient documentation

## 2016-12-08 DIAGNOSIS — J45909 Unspecified asthma, uncomplicated: Secondary | ICD-10-CM | POA: Insufficient documentation

## 2016-12-08 DIAGNOSIS — Z955 Presence of coronary angioplasty implant and graft: Secondary | ICD-10-CM | POA: Insufficient documentation

## 2016-12-08 DIAGNOSIS — R079 Chest pain, unspecified: Secondary | ICD-10-CM | POA: Diagnosis not present

## 2016-12-08 DIAGNOSIS — Z813 Family history of other psychoactive substance abuse and dependence: Secondary | ICD-10-CM | POA: Diagnosis not present

## 2016-12-08 DIAGNOSIS — I2510 Atherosclerotic heart disease of native coronary artery without angina pectoris: Secondary | ICD-10-CM | POA: Insufficient documentation

## 2016-12-08 DIAGNOSIS — R05 Cough: Secondary | ICD-10-CM | POA: Diagnosis not present

## 2016-12-08 DIAGNOSIS — Z7982 Long term (current) use of aspirin: Secondary | ICD-10-CM | POA: Diagnosis not present

## 2016-12-08 DIAGNOSIS — Z79899 Other long term (current) drug therapy: Secondary | ICD-10-CM | POA: Diagnosis not present

## 2016-12-08 DIAGNOSIS — N183 Chronic kidney disease, stage 3 (moderate): Secondary | ICD-10-CM | POA: Diagnosis not present

## 2016-12-08 DIAGNOSIS — Z794 Long term (current) use of insulin: Secondary | ICD-10-CM | POA: Insufficient documentation

## 2016-12-08 DIAGNOSIS — F1424 Cocaine dependence with cocaine-induced mood disorder: Secondary | ICD-10-CM | POA: Diagnosis not present

## 2016-12-08 DIAGNOSIS — I129 Hypertensive chronic kidney disease with stage 1 through stage 4 chronic kidney disease, or unspecified chronic kidney disease: Secondary | ICD-10-CM | POA: Diagnosis not present

## 2016-12-08 DIAGNOSIS — E1140 Type 2 diabetes mellitus with diabetic neuropathy, unspecified: Secondary | ICD-10-CM | POA: Diagnosis not present

## 2016-12-08 DIAGNOSIS — R45851 Suicidal ideations: Secondary | ICD-10-CM | POA: Diagnosis present

## 2016-12-08 DIAGNOSIS — F1420 Cocaine dependence, uncomplicated: Secondary | ICD-10-CM | POA: Diagnosis present

## 2016-12-08 DIAGNOSIS — F1994 Other psychoactive substance use, unspecified with psychoactive substance-induced mood disorder: Secondary | ICD-10-CM | POA: Diagnosis not present

## 2016-12-08 LAB — RAPID URINE DRUG SCREEN, HOSP PERFORMED
Amphetamines: NOT DETECTED
Barbiturates: NOT DETECTED
Benzodiazepines: NOT DETECTED
Cocaine: POSITIVE — AB
Opiates: NOT DETECTED
Tetrahydrocannabinol: NOT DETECTED

## 2016-12-08 LAB — COMPREHENSIVE METABOLIC PANEL WITH GFR
ALT: 16 U/L — ABNORMAL LOW (ref 17–63)
AST: 26 U/L (ref 15–41)
Albumin: 3.6 g/dL (ref 3.5–5.0)
Alkaline Phosphatase: 83 U/L (ref 38–126)
Anion gap: 11 (ref 5–15)
BUN: 36 mg/dL — ABNORMAL HIGH (ref 6–20)
CO2: 24 mmol/L (ref 22–32)
Calcium: 8.9 mg/dL (ref 8.9–10.3)
Chloride: 101 mmol/L (ref 101–111)
Creatinine, Ser: 1.82 mg/dL — ABNORMAL HIGH (ref 0.61–1.24)
GFR calc Af Amer: 43 mL/min — ABNORMAL LOW
GFR calc non Af Amer: 37 mL/min — ABNORMAL LOW
Glucose, Bld: 258 mg/dL — ABNORMAL HIGH (ref 65–99)
Potassium: 3.6 mmol/L (ref 3.5–5.1)
Sodium: 136 mmol/L (ref 135–145)
Total Bilirubin: 0.5 mg/dL (ref 0.3–1.2)
Total Protein: 7.5 g/dL (ref 6.5–8.1)

## 2016-12-08 LAB — I-STAT TROPONIN, ED: Troponin i, poc: 0 ng/mL (ref 0.00–0.08)

## 2016-12-08 LAB — CBC
HCT: 32.6 % — ABNORMAL LOW (ref 39.0–52.0)
Hemoglobin: 10.6 g/dL — ABNORMAL LOW (ref 13.0–17.0)
MCH: 26.1 pg (ref 26.0–34.0)
MCHC: 32.5 g/dL (ref 30.0–36.0)
MCV: 80.3 fL (ref 78.0–100.0)
Platelets: 191 K/uL (ref 150–400)
RBC: 4.06 MIL/uL — ABNORMAL LOW (ref 4.22–5.81)
RDW: 14.5 % (ref 11.5–15.5)
WBC: 6.2 K/uL (ref 4.0–10.5)

## 2016-12-08 LAB — SALICYLATE LEVEL: Salicylate Lvl: <7 mg/dL (ref 2.8–30.0)

## 2016-12-08 LAB — ETHANOL: Alcohol, Ethyl (B): <5 mg/dL

## 2016-12-08 LAB — ACETAMINOPHEN LEVEL: Acetaminophen (Tylenol), Serum: <10 ug/mL — ABNORMAL LOW (ref 10–30)

## 2016-12-08 LAB — CBG MONITORING, ED: Glucose-Capillary: 263 mg/dL — ABNORMAL HIGH (ref 65–99)

## 2016-12-08 MED ORDER — DICLOFENAC SODIUM 1 % TD GEL
2.0000 g | Freq: Four times a day (QID) | TRANSDERMAL | Status: DC | PRN
  Filled 2016-12-08: qty 100

## 2016-12-08 MED ORDER — ISOSORBIDE MONONITRATE ER 30 MG PO TB24
30.0000 mg | ORAL_TABLET | Freq: Every day | ORAL | Status: DC
  Administered 2016-12-08: 30 mg via ORAL
  Filled 2016-12-08 (×2): qty 1

## 2016-12-08 MED ORDER — CYCLOBENZAPRINE HCL 10 MG PO TABS: 10.0000 mg | ORAL_TABLET | Freq: Three times a day (TID) | ORAL | Status: DC | PRN

## 2016-12-08 MED ORDER — AMANTADINE HCL 100 MG PO CAPS
100.0000 mg | ORAL_CAPSULE | Freq: Every day | ORAL | Status: DC
  Administered 2016-12-08 – 2016-12-09 (×2): 100 mg via ORAL
  Filled 2016-12-08 (×2): qty 1

## 2016-12-08 MED ORDER — ASPIRIN EC 81 MG PO TBEC
81.0000 mg | DELAYED_RELEASE_TABLET | Freq: Every day | ORAL | Status: DC
  Administered 2016-12-08 – 2016-12-09 (×2): 81 mg via ORAL
  Filled 2016-12-08 (×2): qty 1

## 2016-12-08 MED ORDER — RISPERIDONE 1 MG PO TABS
1.0000 mg | ORAL_TABLET | Freq: Every day | ORAL | Status: DC
  Administered 2016-12-08: 1 mg via ORAL
  Filled 2016-12-08: qty 1

## 2016-12-08 MED ORDER — ALLOPURINOL 100 MG PO TABS
200.0000 mg | ORAL_TABLET | Freq: Every day | ORAL | Status: DC
Start: 2016-12-08 — End: 2016-12-09
  Administered 2016-12-08 – 2016-12-09 (×2): 200 mg via ORAL
  Filled 2016-12-08 (×2): qty 2

## 2016-12-08 MED ORDER — FAMOTIDINE 20 MG PO TABS
20.0000 mg | ORAL_TABLET | Freq: Every day | ORAL | Status: DC
  Administered 2016-12-08 – 2016-12-09 (×2): 20 mg via ORAL
  Filled 2016-12-08 (×2): qty 1

## 2016-12-08 MED ORDER — ALUM & MAG HYDROXIDE-SIMETH 200-200-20 MG/5ML PO SUSP: 30.0000 mL | ORAL | Status: DC | PRN

## 2016-12-08 MED ORDER — GLIPIZIDE ER 5 MG PO TB24
5.0000 mg | ORAL_TABLET | Freq: Every day | ORAL | Status: DC
  Administered 2016-12-09: 5 mg via ORAL
  Filled 2016-12-08: qty 1

## 2016-12-08 MED ORDER — LORAZEPAM 1 MG PO TABS: 1.0000 mg | ORAL_TABLET | Freq: Three times a day (TID) | ORAL | Status: DC | PRN

## 2016-12-08 MED ORDER — NICOTINE 21 MG/24HR TD PT24
21.0000 mg | MEDICATED_PATCH | Freq: Every day | TRANSDERMAL | Status: DC
  Filled 2016-12-08 (×2): qty 1

## 2016-12-08 MED ORDER — METOPROLOL SUCCINATE ER 25 MG PO TB24
25.0000 mg | ORAL_TABLET | Freq: Every day | ORAL | Status: DC
  Administered 2016-12-08: 25 mg via ORAL
  Filled 2016-12-08 (×2): qty 1

## 2016-12-08 MED ORDER — EMTRICITAB-RILPIVIR-TENOFOV AF 200-25-25 MG PO TABS
1.0000 | ORAL_TABLET | Freq: Every day | ORAL | Status: DC
  Administered 2016-12-09: 1 via ORAL
  Filled 2016-12-08: qty 1

## 2016-12-08 MED ORDER — DILTIAZEM HCL ER COATED BEADS 120 MG PO CP24
120.0000 mg | ORAL_CAPSULE | Freq: Every day | ORAL | Status: DC
Start: 2016-12-08 — End: 2016-12-09
  Administered 2016-12-08: 120 mg via ORAL
  Filled 2016-12-08 (×2): qty 1

## 2016-12-08 MED ORDER — GABAPENTIN 400 MG PO CAPS
400.0000 mg | ORAL_CAPSULE | Freq: Three times a day (TID) | ORAL | Status: DC
  Administered 2016-12-08 – 2016-12-09 (×2): 400 mg via ORAL
  Filled 2016-12-08 (×2): qty 1

## 2016-12-08 MED ORDER — INSULIN ASPART 100 UNIT/ML ~~LOC~~ SOLN: 2.0000 [IU] | Freq: Three times a day (TID) | SUBCUTANEOUS | Status: DC

## 2016-12-08 MED ORDER — INSULIN GLARGINE 100 UNIT/ML ~~LOC~~ SOLN
25.0000 [IU] | Freq: Every day | SUBCUTANEOUS | Status: DC
  Administered 2016-12-08: 25 [IU] via SUBCUTANEOUS
  Filled 2016-12-08: qty 0.25

## 2016-12-08 MED ORDER — MIRTAZAPINE 7.5 MG PO TABS
7.5000 mg | ORAL_TABLET | Freq: Every day | ORAL | Status: DC
  Administered 2016-12-08: 7.5 mg via ORAL
  Filled 2016-12-08 (×2): qty 1

## 2016-12-08 MED ORDER — PRAVASTATIN SODIUM 40 MG PO TABS
40.0000 mg | ORAL_TABLET | Freq: Every day | ORAL | Status: DC
  Administered 2016-12-08: 40 mg via ORAL
  Filled 2016-12-08: qty 1

## 2016-12-08 MED ORDER — METFORMIN HCL 500 MG PO TABS
500.0000 mg | ORAL_TABLET | Freq: Two times a day (BID) | ORAL | Status: DC
  Administered 2016-12-09: 500 mg via ORAL
  Filled 2016-12-08: qty 1

## 2016-12-08 MED ORDER — ONDANSETRON HCL 4 MG PO TABS: 4.0000 mg | ORAL_TABLET | Freq: Three times a day (TID) | ORAL | Status: DC | PRN

## 2016-12-08 MED ORDER — CLOPIDOGREL BISULFATE 75 MG PO TABS
75.0000 mg | ORAL_TABLET | Freq: Every day | ORAL | Status: DC
  Administered 2016-12-08 – 2016-12-09 (×2): 75 mg via ORAL
  Filled 2016-12-08 (×2): qty 1

## 2016-12-08 MED ORDER — BUPROPION HCL ER (XL) 150 MG PO TB24
150.0000 mg | ORAL_TABLET | Freq: Every day | ORAL | Status: DC
Start: 2016-12-08 — End: 2016-12-09
  Administered 2016-12-08 – 2016-12-09 (×2): 150 mg via ORAL
  Filled 2016-12-08 (×2): qty 1

## 2016-12-08 MED ORDER — ALBUTEROL SULFATE HFA 108 (90 BASE) MCG/ACT IN AERS: 2.0000 | INHALATION_SPRAY | Freq: Four times a day (QID) | RESPIRATORY_TRACT | Status: DC | PRN

## 2016-12-08 MED ORDER — LOSARTAN POTASSIUM 50 MG PO TABS
50.0000 mg | ORAL_TABLET | Freq: Every day | ORAL | Status: DC
  Administered 2016-12-08 – 2016-12-09 (×2): 50 mg via ORAL
  Filled 2016-12-08 (×2): qty 1

## 2016-12-08 MED ORDER — ESCITALOPRAM OXALATE 10 MG PO TABS
5.0000 mg | ORAL_TABLET | Freq: Every day | ORAL | Status: DC
  Administered 2016-12-08 – 2016-12-09 (×2): 5 mg via ORAL
  Filled 2016-12-08 (×2): qty 1

## 2016-12-08 NOTE — ED Notes (Signed)
Patient was informed urine sample is needed

## 2016-12-08 NOTE — ED Notes (Signed)
Bed: BR49 Expected date:  Expected time:  Means of arrival:  Comments: Room 5

## 2016-12-08 NOTE — ED Notes (Addendum)
Pt stated "I just moved here a month ago.  I've been staying @ the shelter.  I hope to get long term treatment.  Can I have something to eat?  I haven't eaten x 2 days."  When questioned about SI plan, pt stated "I just find myself out in the street.  They blow the horn and I jump out of the way."  Pt provided [place] sandwich.  Pt stated "I haven't taken any of my meds today."

## 2016-12-08 NOTE — ED Provider Notes (Signed)
[facility] DEPT Provider Note   CSN: [id number] Arrival date & time: 12/08/16  1805     History   Chief Complaint Chief Complaint  Patient presents with  . Suicidal  . Cough  . Chest Pain    x3 months    HPI Michael [name] is a 66 y.o. male.  66 year old male here with suicidal ideations with plan to overdose on crack cocaine. States that he is trying to walk in front of vehicles with the intent of taking his life. Says he is also hearing voices was told to harm himself. Denies being diagnosed with schizophrenia past. No current intentional ingestions at this time. He also has had persistent three-month history of left-sided chest pain which is been not anginal or CHF sounding. He denies any daily use of alcohol. Has a slight cough is been productive. Denies any use of medications to make his symptoms better.      Past Medical History:  Diagnosis Date  . A-fib ([place])   . Asthma    No PFTs, history of childhood asthma  . CAD (coronary artery disease)    a. 06/2013 STEMI/PCI ([facility]): LAD w/ thrombus (treated with BMS), mid 75%, D2 75%; LCX OM2 75%; RCA small, PDA 95%, PLV 95%;  b. 10/2013 Cath/PCI: ISR w/in LAD (Promus DES x 2), borderline OM2 lesion;  c. 01/2014 MV: Intermediate risk, medium-sized distal ant wall infarct w/ very small amt of peri-infarct ischemia. EF 60%.  . Cellulitis 04/2014   left facial  . Chondromalacia of medial femoral condyle    Left knee MRI 04/28/12: Chondromalacia of the medial femoral condyle with slight peripheral degeneration of the meniscocapsular junction of the medial meniscus; followed by sports medicine  . Collagen vascular disease ([place])   . Crack cocaine use    for 20+ years, has been enrolled in detox programs in the past  . Depression    with history of hospitalization for suicidal ideation  . Diabetes mellitus 2002   Diagnosed in 2002, started insulin in 2012  . Gout   . Gout 04/28/2012  . Headache(784.0)    CT head 08/2011:  Periventricular and subcortical white matter hypodensities are most in keeping with chronic microangiopathic change  . HIV infection [facility]) Nov 2012   Followed by [name]  . Hyperlipidemia   . Hypertension   . Pulmonary embolism [facility])     Patient Active Problem List   Diagnosis Date Noted  . Substance induced mood disorder ([place]) 11/23/2016  . Back pain 04/18/2016  . S/P carotid endarterectomy 11/15/2015  . MDD (major depressive disorder), recurrent severe, without psychosis ([place]) 09/09/2015  . Cocaine dependence with cocaine-induced mood disorder ([place])   . Cocaine-induced mood disorder ([place]) 08/14/2015  . Gout 07/10/2015  . Cough 04/26/2015  . Lactic acidosis 03/06/2015  . CKD (chronic kidney disease) stage 3, GFR 30-59 ml/min 03/06/2015  . Normocytic anemia 03/06/2015  . Hypoglycemia   . Encounter for general adult medical examination with abnormal findings 02/09/2015  . Cocaine use disorder, severe, dependence ([place]) 12/13/2014  . Substance or medication-induced depressive disorder with onset during withdrawal ([place]) 12/13/2014  . Cervicalgia 06/28/2014  . Lumbar radiculopathy, chronic 06/28/2014  . Asthma, chronic 02/03/2014  . 3-vessel CAD 06/24/2013  . ED (erectile dysfunction) of organic origin 07/07/2012  . Hypertension goal BP (blood pressure) < 140/80 04/29/2012  . Chondromalacia of left knee 03/19/2012  . Hyperlipidemia with target LDL less than 100 02/12/2012  . Fibromyalgia 02/12/2012  . HIV (human immunodeficiency  virus infection) ([place]) 08/27/2011  . Uncontrolled type 2 diabetes with neuropathy ([place]) 10/17/2000    Past Surgical History:  Procedure Laterality Date  . BACK SURGERY     1988  . BOWEL RESECTION    . CARDIAC SURGERY    . CERVICAL SPINE SURGERY     " rods in my neck "  . CORONARY STENT PLACEMENT    . NM MYOCAR PERF WALL MOTION  12/27/2011   normal  . SPINE SURGERY         Home Medications    Prior to Admission medications   Medication  Sig Start Date End Date Taking? Authorizing Provider  albuterol (PROAIR HFA) 108 (90 Base) MCG/ACT inhaler Inhale 2 puffs into the lungs every 6 (six) hours as needed for wheezing or shortness of breath. 04/10/16   Norman Herrlich, MD  allopurinol (ZYLOPRIM) 100 MG tablet Take 200 mg by mouth daily.    Historical Provider, MD  amantadine (SYMMETREL) 100 MG capsule Take 100 mg by mouth daily.     Historical Provider, MD  aspirin EC 81 MG tablet Take 81 mg by mouth daily.    Historical Provider, MD  buPROPion (WELLBUTRIN XL) 150 MG 24 hr tablet Take 150 mg by mouth daily.    Historical Provider, MD  clopidogrel (PLAVIX) 75 MG tablet Take 75 mg by mouth daily.    Historical Provider, MD  cyclobenzaprine (FLEXERIL) 10 MG tablet Take 10 mg by mouth 3 (three) times daily as needed for muscle spasms.    Historical Provider, MD  diclofenac sodium (VOLTAREN) 1 % GEL Apply 2 g topically 4 (four) times daily as needed (for pain).    Historical Provider, MD  diltiazem (CARDIZEM CD) 120 MG 24 hr capsule Take 1 capsule (120 mg total) by mouth daily. 04/10/16   Norman Herrlich, MD  emtricitabine-rilpivir-tenofovir DF (COMPLERA) 200-25-300 MG tablet Take 1 tablet by mouth daily. 04/10/16   Norman Herrlich, MD  escitalopram (LEXAPRO) 5 MG tablet Take 1 tablet (5 mg total) by mouth daily. 11/27/16   Patrecia Pour, NP  famotidine (PEPCID) 20 MG tablet Take 20 mg by mouth daily.    Historical Provider, MD  gabapentin (NEURONTIN) 400 MG capsule Take 400 mg by mouth 3 (three) times daily.    Historical Provider, MD  glipiZIDE (GLUCOTROL XL) 5 MG 24 hr tablet Take 5 mg by mouth daily with breakfast.    Historical Provider, MD  insulin aspart (NOVOLOG) 100 UNIT/ML injection Inject 2-4 Units into the skin 3 (three) times daily before meals. Pt uses per sliding scale.    Historical Provider, MD  insulin glargine (LANTUS) 100 unit/mL SOPN Inject 25 Units into the skin at bedtime.    Historical Provider, MD  isosorbide mononitrate  (IMDUR) 30 MG 24 hr tablet Take 1 tablet (30 mg total) by mouth daily. 04/10/16   Norman Herrlich, MD  losartan (COZAAR) 50 MG tablet Take 1 tablet (50 mg total) by mouth daily. 04/10/16   Norman Herrlich, MD  metFORMIN (GLUCOPHAGE) 500 MG tablet Take 500 mg by mouth 2 (two) times daily before a meal.    Historical Provider, MD  metoprolol succinate (TOPROL-XL) 25 MG 24 hr tablet Take 1 tablet (25 mg total) by mouth daily. 04/10/16   Norman Herrlich, MD  mirtazapine (REMERON) 7.5 MG tablet Take 7.5 mg by mouth at bedtime.    Historical Provider, MD  pravastatin (PRAVACHOL) 40 MG tablet Take 40 mg by mouth at bedtime.  Historical Provider, MD  risperiDONE (RISPERDAL) 1 MG tablet Take 1 tablet (1 mg total) by mouth at bedtime. 04/10/16   Norman Herrlich, MD    Family History Family History  Problem Relation Age of Onset  . Diabetes Mother   . Hypertension Mother   . Hyperlipidemia Mother   . Diabetes Father   . Cancer Father   . Hypertension Father   . Diabetes Brother   . Heart disease Brother   . Diabetes Sister   . Colon cancer Neg Hx     Social History Social History  Substance Use Topics  . Smoking status: Never Smoker  . Smokeless tobacco: Never Used  . Alcohol use No     Allergies   Patient has no known allergies.   Review of Systems Review of Systems  All other systems reviewed and are negative.    Physical Exam Updated Vital Signs BP 139/69 (BP Location: Left Arm)   Pulse 87   Temp 98.2 F (36.8 C) (Oral)   Resp 11   Ht 5\' 7"  (1.702 m)   Wt 78.9 kg   SpO2 97%   BMI 27.25 kg/m   Physical Exam  Constitutional: He is oriented to person, place, and time. He appears well-developed and well-nourished.  Non-toxic appearance. No distress.  HENT:  Head: Normocephalic and atraumatic.  Eyes: Conjunctivae, EOM and lids are normal. Pupils are equal, round, and reactive to light.  Neck: Normal range of motion. Neck supple. No tracheal deviation present. No thyroid mass  present.  Cardiovascular: Normal rate, regular rhythm and normal heart sounds.  Exam reveals no gallop.   No murmur heard. Pulmonary/Chest: Effort normal and breath sounds normal. No stridor. No respiratory distress. He has no decreased breath sounds. He has no wheezes. He has no rhonchi. He has no rales.  Abdominal: Soft. Normal appearance and bowel sounds are normal. He exhibits no distension. There is no tenderness. There is no rebound and no CVA tenderness.  Musculoskeletal: Normal range of motion. He exhibits no edema or tenderness.  Neurological: He is alert and oriented to person, place, and time. He has normal strength. No cranial nerve deficit or sensory deficit. GCS eye subscore is 4. GCS verbal subscore is 5. GCS motor subscore is 6.  Skin: Skin is warm and dry. No abrasion and no rash noted.  Psychiatric: His affect is blunt. His speech is delayed. He is withdrawn. He is not actively hallucinating. Thought content is paranoid. He expresses suicidal ideation. He expresses no homicidal ideation. He expresses suicidal plans. He expresses no homicidal plans.  Nursing note and vitals reviewed.    ED Treatments / Results  Labs (all labs ordered are listed, but only abnormal results are displayed) Labs Reviewed  COMPREHENSIVE METABOLIC PANEL - Abnormal; Notable for the following:       Result Value   Glucose, Bld 258 (*)    BUN 36 (*)    Creatinine, Ser 1.82 (*)    ALT 16 (*)    GFR calc non Af Amer 37 (*)    GFR calc Af Amer 43 (*)    All other components within normal limits  ACETAMINOPHEN LEVEL - Abnormal; Notable for the following:    Acetaminophen (Tylenol), Serum <10 (*)    All other components within normal limits  CBC - Abnormal; Notable for the following:    RBC 4.06 (*)    Hemoglobin 10.6 (*)    HCT 32.6 (*)    All other components within  normal limits  ETHANOL  SALICYLATE LEVEL  RAPID URINE DRUG SCREEN, HOSP PERFORMED  I-STAT TROPOININ, ED    EKG  EKG  Interpretation  Date/Time:  Sunday December 08 2016 18:32:37 EST Ventricular Rate:  97 PR Interval:    QRS Duration: 63 QT Interval:  338 QTC Calculation: 430 R Axis:   67 Text Interpretation:  Sinus rhythm Probable left atrial enlargement Borderline T abnormalities, lateral leads since last tracing no significant change Confirmed by Eulis Foster  MD, Vira Agar [phone]) on 12/08/2016 6:43:32 PM       Radiology Dg Chest 2 View  Result Date: 12/08/2016 CLINICAL DATA:  Pt states that he is suicidal, He also states that he has a really bad drug problem (crack cocaine). Last used this morning. He states that he also has left sided chest pain x3 months and a productive cough, which has worsened today. EXAM: CHEST  2 VIEW COMPARISON:  Chest x-ray dated 11/26/2016. FINDINGS: The heart size and mediastinal contours are within normal limits. Presumed cardiac stent in place. Both lungs are clear.  No pleural effusion or pneumothorax. No acute osseous abnormality. Mild degenerative spurring within the thoracic spine. Anterior cervical fusion hardware within the lower cervical spine. IMPRESSION: No active cardiopulmonary disease. No evidence of pneumonia or pulmonary edema. Electronically Signed   By: Franki Cabot M.D.   On: 12/08/2016 18:46    Procedures Procedures (including critical care time)  Medications Ordered in ED Medications  ondansetron (ZOFRAN) tablet 4 mg (not administered)  alum & mag hydroxide-simeth (MAALOX/MYLANTA) 200-200-20 MG/5ML suspension 30 mL (not administered)  nicotine (NICODERM CQ - dosed in mg/24 hours) patch 21 mg (not administered)  LORazepam (ATIVAN) tablet 1 mg (not administered)  albuterol (PROVENTIL HFA;VENTOLIN HFA) 108 (90 Base) MCG/ACT inhaler 2 puff (not administered)  allopurinol (ZYLOPRIM) tablet 200 mg (not administered)  amantadine (SYMMETREL) capsule 100 mg (not administered)  aspirin EC tablet 81 mg (not administered)  clopidogrel (PLAVIX) tablet 75 mg (not  administered)  diclofenac sodium (VOLTAREN) 1 % transdermal gel 2 g (not administered)  risperiDONE (RISPERDAL) tablet 1 mg (not administered)  pravastatin (PRAVACHOL) tablet 40 mg (not administered)  mirtazapine (REMERON) tablet 7.5 mg (not administered)  metoprolol succinate (TOPROL-XL) 24 hr tablet 25 mg (not administered)  metFORMIN (GLUCOPHAGE) tablet 500 mg (not administered)  isosorbide mononitrate (IMDUR) 24 hr tablet 30 mg (not administered)  losartan (COZAAR) tablet 50 mg (not administered)  diltiazem (CARDIZEM CD) 24 hr capsule 120 mg (not administered)  emtricitabine-rilpivir-tenofovir AF (ODEFSEY) 200-25-25 MG per tablet 1 tablet (not administered)  buPROPion (WELLBUTRIN XL) 24 hr tablet 150 mg (not administered)  cyclobenzaprine (FLEXERIL) tablet 10 mg (not administered)  escitalopram (LEXAPRO) tablet 5 mg (not administered)  famotidine (PEPCID) tablet 20 mg (not administered)  gabapentin (NEURONTIN) capsule 400 mg (not administered)  glipiZIDE (GLUCOTROL XL) 24 hr tablet 5 mg (not administered)  insulin aspart (novoLOG) injection 2-4 Units (not administered)  insulin glargine (LANTUS) Solostar Pen 25 Units (not administered)     Initial Impression / Assessment and Plan / ED Course  I have reviewed the triage vital signs and the nursing notes.  Pertinent labs & imaging results that were available during my care of the patient were reviewed by me and considered in my medical decision making (see chart for details).     Patient medically clear for psychiatric disposition. Will restart his medications for his diabetes. He is mildly hyperglycemic here with a sugar 258. Patient's creatinine is at his baseline. Patient to be dispositioned  by psychiatry  Final Clinical Impressions(s) / ED Diagnoses   Final diagnoses:  None    New Prescriptions New Prescriptions   No medications on file     Lacretia Leigh, MD 12/08/16 1952

## 2016-12-08 NOTE — ED Notes (Signed)
Pt is aware we need a urine sample. 

## 2016-12-08 NOTE — ED Triage Notes (Signed)
Pt states that he is suicidal, He also states that he has a really bad drug problem (crack cocaine). Last used this morning. He endorses AVH. He states that he also has left sided chest pain x3 months and a productive cough.

## 2016-12-08 NOTE — ED Notes (Signed)
Notified EDP of pt glucose, no new orders, continue with pt home meds.

## 2016-12-08 NOTE — ED Notes (Signed)
TTS at bedside for assessment 

## 2016-12-08 NOTE — ED Notes (Signed)
Bed: IO14 Expected date:  Expected time:  Means of arrival:  Comments: TR 3

## 2016-12-08 NOTE — BH Assessment (Signed)
Tele Assessment Note   Michael [name] is an 66 y.o. male, who presents voluntarily and unaccompanied to [facility]. Pt reported, he is currently suicidal and has been has been on and off for years. Pt reported, two months ago he attempted suicide by cutting his wrist with a knife. Pt reported, he did not break the skin because the knife was too dull. Pt reported, he has been stepping out in front of cars. Pt reported, hearing a voice telling him "to jump in front of cars, I'm worthless, and to cut myself." Pt reported, he's been hearing the voice for a while however, he's been hearing the voice on and off today. Pt reported, in 1985 he was injured on the job. Pt reported, he became addicted to pain pills, his doctor took him off pain pills in 1989. Pt reported, he substituted the pain pills for crack cocaine. Pt reported, using crack cocaine to manage his chronic pain. Pt reported, using crack cocaine on and off. Pt reported, experiencing the following depressive symptoms: tearfulness, excessive guilt, feeling hopeless/worthless, difficulty focusing, and irritability. Pt denied, HI and self-injurious behaviors.   Pt denied experiencing abuse. Pt reported, smoking $200-$300 worth of crack today. Pt reported, denied being linked to OPT resources (medication management and/or counseling). Pt reported, previous inpatient admission.   Pt presented quiet/awake in scrubs with logical/coherent speech. Pt's mood was depressed. Pt's affect was congruent with mood. Pt's thought process was coherent/relevant. Pt's judgement was partial. Pt's concentration was normal. Pt's insight was fair. Pt's impulse control was poor. Pt was oriented x3 (year, city and state). Pt reported, if inpatient treatment was recommended he would sign in voluntarily.   Diagnosis: Major Depressive Disorder, Recurrent, Severe with Psychotic Features                   Cocaine Use Disorder, Severe  Past Medical History:  Past Medical History:   Diagnosis Date  . A-fib ([place])   . Asthma    No PFTs, history of childhood asthma  . CAD (coronary artery disease)    a. 06/2013 STEMI/PCI ([facility]): LAD w/ thrombus (treated with BMS), mid 75%, D2 75%; LCX OM2 75%; RCA small, PDA 95%, PLV 95%;  b. 10/2013 Cath/PCI: ISR w/in LAD (Promus DES x 2), borderline OM2 lesion;  c. 01/2014 MV: Intermediate risk, medium-sized distal ant wall infarct w/ very small amt of peri-infarct ischemia. EF 60%.  . Cellulitis 04/2014   left facial  . Chondromalacia of medial femoral condyle    Left knee MRI 04/28/12: Chondromalacia of the medial femoral condyle with slight peripheral degeneration of the meniscocapsular junction of the medial meniscus; followed by sports medicine  . Collagen vascular disease ([place])   . Crack cocaine use    for 20+ years, has been enrolled in detox programs in the past  . Depression    with history of hospitalization for suicidal ideation  . Diabetes mellitus 2002   Diagnosed in 2002, started insulin in 2012  . Gout   . Gout 04/28/2012  . Headache(784.0)    CT head 08/2011: Periventricular and subcortical white matter hypodensities are most in keeping with chronic microangiopathic change  . HIV infection [facility]) Nov 2012   Followed by [name]  . Hyperlipidemia   . Hypertension   . Pulmonary embolism [facility])     Past Surgical History:  Procedure Laterality Date  . BACK SURGERY     1988  . BOWEL RESECTION    . CARDIAC SURGERY    .  CERVICAL SPINE SURGERY     " rods in my neck "  . CORONARY STENT PLACEMENT    . NM MYOCAR PERF WALL MOTION  12/27/2011   normal  . SPINE SURGERY      Family History:  Family History  Problem Relation Age of Onset  . Diabetes Mother   . Hypertension Mother   . Hyperlipidemia Mother   . Diabetes Father   . Cancer Father   . Hypertension Father   . Diabetes Brother   . Heart disease Brother   . Diabetes Sister   . Colon cancer Neg Hx     Social History:  reports that he has never smoked.  He has never used smokeless tobacco. He reports that he uses drugs, including "Crack" cocaine and Cocaine. He reports that he does not drink alcohol.  Additional Social History:  Alcohol / Drug Use Pain Medications: See MAR Prescriptions: See MAR  Over the Counter: See MAR History of alcohol / drug use?: Yes Substance #1 Name of Substance 1: Crack Cocaine 1 - Age of First Use: UTA 1 - Amount (size/oz): Pt reported, smoking $200-300 worth of crack today.  1 - Frequency: UTA 1 - Duration: UTA 1 - Last Use / Amount: Pt reported, today.   CIWA: CIWA-Ar BP: 139/69 Pulse Rate: 87 COWS:    PATIENT STRENGTHS: (choose at least two) Average or above average intelligence Motivation for treatment/growth Supportive family/friends  Allergies: No Known Allergies  Home Medications:  (Not in a hospital admission)  OB/GYN Status:  No LMP for male patient.  General Assessment Data Location of Assessment: [facility] ED TTS Assessment: In system Is this a Tele or Face-to-Face Assessment?: Face-to-Face Is this an Initial Assessment or a Re-assessment for this encounter?: Initial Assessment Marital status: Single Is patient pregnant?: No Pregnancy Status: No Living Arrangements: Alone Can pt return to current living arrangement?: Yes Admission Status: Voluntary Is patient capable of signing voluntary admission?: Yes Referral Source: Self/Family/Friend Insurance type: [employer]     Crisis Care Plan Living Arrangements: Alone Legal Guardian: Other: (Self) Name of Psychiatrist: NA Name of Therapist: NA  Education Status Is patient currently in school?: No Current Grade: NA Highest grade of school patient has completed: 12th grade Name of school: NA Contact person: NA  Risk to self with the past 6 months Suicidal Ideation: Yes-Currently Present Has patient been a risk to self within the past 6 months prior to admission? : Yes Suicidal Intent: Yes-Currently Present Has  patient had any suicidal intent within the past 6 months prior to admission? : Yes Is patient at risk for suicide?: Yes Suicidal Plan?: Yes-Currently Present Has patient had any suicidal plan within the past 6 months prior to admission? : Yes Specify Current Suicidal Plan: Pt reported, a plan of walking in front of cars in traffic.  Access to Means: Yes Specify Access to Suicidal Means: Pt is able to walk. What has been your use of drugs/alcohol within the last 12 months?: Crack cocaine.  Previous Attempts/Gestures: Yes How many times?: 1 Other Self Harm Risks: Cutting Triggers for Past Attempts: Unpredictable Intentional Self Injurious Behavior: Cutting Comment - Self Injurious Behavior: Pt reported, cutting his wrists two months ago.  Family Suicide History: No Recent stressful life event(s): Other (Comment) (Pt reported, health-chronic pain, and SI.) Persecutory voices/beliefs?: Yes Depression: Yes Depression Symptoms: Tearfulness, Guilt, Feeling angry/irritable, Feeling worthless/self pity, Isolating Substance abuse history and/or treatment for substance abuse?: Yes Suicide prevention information given to non-admitted patients:  Not applicable  Risk to Others within the past 6 months Homicidal Ideation:  (Pt denies.) Does patient have any lifetime risk of violence toward others beyond the six months prior to admission? : No Thoughts of Harm to Others: No Current Homicidal Intent: No Current Homicidal Plan: No Access to Homicidal Means: No Identified Victim: NA History of harm to others?: No Assessment of Violence: None Noted Violent Behavior Description: NA Does patient have access to weapons?: Yes (Comment) (Knives) Criminal Charges Pending?: No Does patient have a court date: No Is patient on probation?: No  Psychosis Hallucinations: Auditory Delusions: None noted  Mental Status Report Appearance/Hygiene: In scrubs Eye Contact: Poor Motor Activity:  Unremarkable Speech: Logical/coherent Level of Consciousness: Quiet/awake Mood: Depressed Affect: Other (Comment) (congruent with mood. ) Anxiety Level: None Thought Processes: Coherent, Relevant Judgement: Partial Orientation: Other (Comment) (year, city and state.) Obsessive Compulsive Thoughts/Behaviors: None  Cognitive Functioning Concentration: Normal Memory: Recent Intact IQ: Average Insight: Fair Impulse Control: Poor Appetite: Poor Weight Loss: 0 Weight Gain: 0 Sleep: Decreased Total Hours of Sleep:  (2-3 hours) Vegetative Symptoms: None  ADLScreening [facility] Assessment Services) Patient's cognitive ability adequate to safely complete daily activities?: Yes Patient able to express need for assistance with ADLs?: Yes Independently performs ADLs?: No  Prior Inpatient Therapy Prior Inpatient Therapy: Yes Prior Therapy Dates: 2017 Prior Therapy Facilty/Provider(s): [facility] Reason for Treatment: suicide attempts.   Prior Outpatient Therapy Prior Outpatient Therapy: No Prior Therapy Dates: NA Prior Therapy Facilty/Provider(s): NA Reason for Treatment: NA Does patient have an ACCT team?: No Does patient have Intensive In-House Services?  : No Does patient have [facility] services? : No Does patient have P4CC services?: No  ADL Screening (condition at time of admission) Patient's cognitive ability adequate to safely complete daily activities?: Yes Is the patient deaf or have difficulty hearing?: No Does the patient have difficulty seeing, even when wearing glasses/contacts?: Yes (Pt reported, needing glasses. ) Does the patient have difficulty concentrating, remembering, or making decisions?: Yes Patient able to express need for assistance with ADLs?: Yes Does the patient have difficulty dressing or bathing?: Yes Independently performs ADLs?: No Communication: Independent Dressing (OT): Independent Grooming: Independent Walks in Home: Needs assistance Is this a  change from baseline?: Pre-admission baseline (Pt reported, needing a cane due to weakness in both legs. ) Does the patient have difficulty walking or climbing stairs?: No Weakness of Legs: Both Weakness of Arms/Hands: None       Abuse/Neglect Assessment (Assessment to be complete while patient is alone) Physical Abuse: Denies (Pt denies. ) Verbal Abuse: Denies (Pt denies. ) Sexual Abuse: Denies (Pt denies. ) Exploitation of patient/patient's resources: Denies (Pt denies. ) Self-Neglect: Denies (Pt denies. )     Advance Directives (For Healthcare) Does Patient Have a Medical Advance Directive?: No Would patient like information on creating a medical advance directive?: No - Patient declined    Additional Information 1:1 In Past 12 Months?: No CIRT Risk: No Elopement Risk: No Does patient have medical clearance?: Yes     Disposition: Lindon Romp, NP recommends gero-psychiatric inpatient treatment. Disposition discussed with Margaretha Sheffield. RN. Margaretha Sheffield, RN will discuss disposition with EDP.   Disposition Initial Assessment Completed for this Encounter: Yes Disposition of Patient: Other dispositions (Pend NP review. ) Other disposition(s): Other (Comment) (Pending NP review. )  Edd Fabian 12/08/2016 8:56 PM   Edd Fabian, MS, [facility], [facility] Triage Specialist [phone]

## 2016-12-08 NOTE — ED Notes (Signed)
Pt is unable to urinate but is aware of the urine specimen needed

## 2016-12-08 NOTE — ED Notes (Signed)
Patient was wanded. Patient has belongings locked up, key and log sheet were left in room 5 file at nurses station.

## 2016-12-08 NOTE — ED Notes (Signed)
ED Provider at bedside. 

## 2016-12-09 DIAGNOSIS — Z813 Family history of other psychoactive substance abuse and dependence: Secondary | ICD-10-CM

## 2016-12-09 DIAGNOSIS — F1994 Other psychoactive substance use, unspecified with psychoactive substance-induced mood disorder: Secondary | ICD-10-CM

## 2016-12-09 DIAGNOSIS — F1424 Cocaine dependence with cocaine-induced mood disorder: Secondary | ICD-10-CM

## 2016-12-09 DIAGNOSIS — Z7982 Long term (current) use of aspirin: Secondary | ICD-10-CM

## 2016-12-09 DIAGNOSIS — Z79899 Other long term (current) drug therapy: Secondary | ICD-10-CM | POA: Diagnosis not present

## 2016-12-09 LAB — CBG MONITORING, ED: GLUCOSE-CAPILLARY: 323 mg/dL — AB (ref 65–99)

## 2016-12-09 NOTE — ED Notes (Signed)
Informed Dr. Dina Rich of pt's low b/p, low & irregular pulse, is asymptomatic, EKG completed this admission.

## 2016-12-09 NOTE — Discharge Instructions (Signed)
To help you maintain a sober lifestyle, a substance abuse treatment program may be beneficial to you.  Contact one of the following facilities at your earliest opportunity to ask about enrolling:  RESIDENTIAL PROGRAMS:       [name]      [place], [place] [address]      [phone]  OUTPATIENT PROGRAMS:       [facility]      [address], [place]. [place], [place] [address]      [phone]      New patients are seen at the walk-in clinic every Tuesday from 9:00 am - 12:00 pm.       [facility]      [place], [place] [address]      [phone]

## 2016-12-09 NOTE — BHH Suicide Risk Assessment (Signed)
Suicide Risk Assessment  Discharge Assessment   [facility] Discharge Suicide Risk Assessment   Principal Problem: Cocaine dependence with cocaine-induced mood disorder [facility]) Discharge Diagnoses:  Patient Active Problem List   Diagnosis Date Noted  . Cocaine dependence with cocaine-induced mood disorder ([place]) [F14.24]     Priority: High  . Cocaine use disorder, severe, dependence ([place]) [F14.20] 12/13/2014    Priority: High  . Substance induced mood disorder ([place]) [F19.94] 11/23/2016  . Back pain [M54.9] 04/18/2016  . S/P carotid endarterectomy [Z98.890] 11/15/2015  . MDD (major depressive disorder), recurrent severe, without psychosis ([place]) [F33.2] 09/09/2015  . Cocaine-induced mood disorder ([place]) [F14.94] 08/14/2015  . Gout [M10.9] 07/10/2015  . Cough [R05] 04/26/2015  . Lactic acidosis [E87.2] 03/06/2015  . CKD (chronic kidney disease) stage 3, GFR 30-59 ml/min [N18.3] 03/06/2015  . Normocytic anemia [D64.9] 03/06/2015  . Hypoglycemia [E16.2]   . Encounter for general adult medical examination with abnormal findings [Z00.01] 02/09/2015  . Substance or medication-induced depressive disorder with onset during withdrawal ([place]) [F19.94] 12/13/2014  . Cervicalgia [M54.2] 06/28/2014  . Lumbar radiculopathy, chronic [M54.16] 06/28/2014  . Asthma, chronic [J45.909] 02/03/2014  . 3-vessel CAD [I25.10] 06/24/2013  . ED (erectile dysfunction) of organic origin [N52.9] 07/07/2012  . Hypertension goal BP (blood pressure) < 140/80 [I10] 04/29/2012  . Chondromalacia of left knee [M94.262] 03/19/2012  . Hyperlipidemia with target LDL less than 100 [E78.5] 02/12/2012  . Fibromyalgia [M79.7] 02/12/2012  . HIV (human immunodeficiency virus infection) ([place]) [B20] 08/27/2011  . Uncontrolled type 2 diabetes with neuropathy ([place]) [E11.40, E11.65] 10/17/2000    Total Time spent with patient: 45 minutes  Musculoskeletal: Strength & Muscle Tone: within normal limits Gait & Station: normal Patient leans:  N/A  Psychiatric Specialty Exam: Physical Exam  Constitutional: He is oriented to person, place, and time. He appears well-developed and well-nourished.  HENT:  Head: Normocephalic.  Neck: Normal range of motion.  Respiratory: Effort normal.  Musculoskeletal: Normal range of motion.  Neurological: He is alert and oriented to person, place, and time.  Psychiatric: He has a normal mood and affect. His speech is normal and behavior is normal. Judgment and thought content normal. Cognition and memory are normal.    Review of Systems  Psychiatric/Behavioral: Positive for substance abuse.  All other systems reviewed and are negative.   Blood pressure 98/56, pulse (!) 58, temperature 98.4 F (36.9 C), temperature source Oral, resp. rate 14, height 5\' 7"  (1.702 m), weight 78.9 kg (174 lb), SpO2 98 %.Body mass index is 27.25 kg/m.  General Appearance: Casual  Eye Contact:  Good  Speech:  Normal Rate  Volume:  Normal  Mood:  Euthymic  Affect:  Congruent  Thought Process:  Coherent and Descriptions of Associations: Intact  Orientation:  Full (Time, Place, and Person)  Thought Content:  WDL  Suicidal Thoughts:  No  Homicidal Thoughts:  No  Memory:  Immediate;   Good Recent;   Good Remote;   Good  Judgement:  Fair  Insight:  Fair  Psychomotor Activity:  Normal  Concentration:  Concentration: Good and Attention Span: Good  Recall:  Good  Fund of Knowledge:  Fair  Language:  Good  Akathisia:  No  Handed:  Right  AIMS (if indicated):     Assets:  Leisure Time Physical Health Resilience  ADL's:  Intact  Cognition:  WNL  Sleep:       Mental Status Per Nursing Assessment::   On Admission:   cocaine abuse with suicidal ideations  Demographic  Factors:  Male and Age 67 or older  Loss Factors: NA  Historical Factors: NA  Risk Reduction Factors:   Sense of responsibility to family  Continued Clinical Symptoms:  None  Cognitive Features That Contribute To Risk:  None     Suicide Risk:  Minimal: No identifiable suicidal ideation.  Patients presenting with no risk factors but with morbid ruminations; may be classified as minimal risk based on the severity of the depressive symptoms    Plan Of Care/Follow-up recommendations:  Activity:  as tolerated Diet:  heart healhty diet  Kiaja Shorty, NP 12/09/2016, 11:13 AM

## 2016-12-09 NOTE — Progress Notes (Addendum)
CSW spoke with patient regarding discharge plans. Patient stated he recently left [place] to live in [place]. He currently has no address. Patient requested SNF list for future reference. Patient has [facility]. CSW provided patient with shelter resources and SNF list.   Kingsley Spittle, [facility] Clinical Social Worker [phone]

## 2016-12-09 NOTE — BH Assessment (Signed)
[facility] Assessment Progress Note  Per Corena Pilgrim, MD, this pt does not require psychiatric hospitalization at this time.  Pt is to be discharged from [facility] with referral information for area substance abuse treatment providers.  This has been included in pt's discharge instructions.  Marius Ditch, LCSW, agrees to address pt's shelter needs.  Pt's nurse has been notified.  Jalene Mullet, [place] Triage Specialist [phone]

## 2016-12-09 NOTE — Consult Note (Signed)
[facility] Consult   Reason for Consult:  Cocaine abuse with suicidal ideations Referring Physician:  EDP Patient Identification: Michael [name] MRN:  [id number] Principal Diagnosis: Cocaine dependence with cocaine-induced mood disorder [facility]) Diagnosis:   Patient Active Problem List   Diagnosis Date Noted  . Cocaine dependence with cocaine-induced mood disorder ([name]) [F14.24]     Priority: High  . Cocaine use disorder, severe, dependence ([place]) [F14.20] 12/13/2014    Priority: High  . Substance induced mood disorder ([place]) [F19.94] 11/23/2016  . Back pain [M54.9] 04/18/2016  . S/P carotid endarterectomy [Z98.890] 11/15/2015  . MDD (major depressive disorder), recurrent severe, without psychosis ([place]) [F33.2] 09/09/2015  . Cocaine-induced mood disorder ([place]) [F14.94] 08/14/2015  . Gout [M10.9] 07/10/2015  . Cough [R05] 04/26/2015  . Lactic acidosis [E87.2] 03/06/2015  . CKD (chronic kidney disease) stage 3, GFR 30-59 ml/min [N18.3] 03/06/2015  . Normocytic anemia [D64.9] 03/06/2015  . Hypoglycemia [E16.2]   . Encounter for general adult medical examination with abnormal findings [Z00.01] 02/09/2015  . Substance or medication-induced depressive disorder with onset during withdrawal ([place]) [F19.94] 12/13/2014  . Cervicalgia [M54.2] 06/28/2014  . Lumbar radiculopathy, chronic [M54.16] 06/28/2014  . Asthma, chronic [J45.909] 02/03/2014  . 3-vessel CAD [I25.10] 06/24/2013  . ED (erectile dysfunction) of organic origin [N52.9] 07/07/2012  . Hypertension goal BP (blood pressure) < 140/80 [I10] 04/29/2012  . Chondromalacia of left knee [M94.262] 03/19/2012  . Hyperlipidemia with target LDL less than 100 [E78.5] 02/12/2012  . Fibromyalgia [M79.7] 02/12/2012  . HIV (human immunodeficiency virus infection) ([place]) [B20] 08/27/2011  . Uncontrolled type 2 diabetes with neuropathy ([place]) [E11.40, E11.65] 10/17/2000    Total Time spent with patient: 45 minutes  Subjective:    Michael [name] is a 66 y.o. male patient does not warrant admission  HPI:  66 yo male who presented to the ED with cocaine abuse with suicidal ideations, present "for years."  He was here last week and kept a few days with no luck finding inpatient hospitalization so outpatient resources were provided.  He chose to use cocaine and not follow-up, big issue is being homeless but does stay in the shelter when he makes it there on time.  No suicidal/homicidal ideations, hallucinations, or withdrawal symptoms.  Stable for discharge.  Past Psychiatric History: cocaine abuse  Risk to Self: None Risk to Others: Homicidal Ideation:  (Pt denies.) Thoughts of Harm to Others: No Current Homicidal Intent: No Current Homicidal Plan: No Access to Homicidal Means: No Identified Victim: NA History of harm to others?: No Assessment of Violence: None Noted Violent Behavior Description: NA Does patient have access to weapons?: Yes (Comment) (Knives) Criminal Charges Pending?: No Does patient have a court date: No Prior Inpatient Therapy: Prior Inpatient Therapy: Yes Prior Therapy Dates: 2017 Prior Therapy Facilty/Provider(s): [facility] Reason for Treatment: suicide attempts.  Prior Outpatient Therapy: Prior Outpatient Therapy: No Prior Therapy Dates: NA Prior Therapy Facilty/Provider(s): NA Reason for Treatment: NA Does patient have an ACCT team?: No Does patient have Intensive In-House Services?  : No Does patient have [facility] services? : No Does patient have P4CC services?: No  Past Medical History:  Past Medical History:  Diagnosis Date  . A-fib ([place])   . Asthma    No PFTs, history of childhood asthma  . CAD (coronary artery disease)    a. 06/2013 STEMI/PCI ([facility]): LAD w/ thrombus (treated with BMS), mid 75%, D2 75%; LCX OM2 75%; RCA small, PDA 95%, PLV 95%;  b. 10/2013 Cath/PCI: ISR w/in  LAD (Promus DES x 2), borderline OM2 lesion;  c. 01/2014 MV: Intermediate risk, medium-sized distal ant wall  infarct w/ very small amt of peri-infarct ischemia. EF 60%.  . Cellulitis 04/2014   left facial  . Chondromalacia of medial femoral condyle    Left knee MRI 04/28/12: Chondromalacia of the medial femoral condyle with slight peripheral degeneration of the meniscocapsular junction of the medial meniscus; followed by sports medicine  . Collagen vascular disease ([place])   . Crack cocaine use    for 20+ years, has been enrolled in detox programs in the past  . Depression    with history of hospitalization for suicidal ideation  . Diabetes mellitus 2002   Diagnosed in 2002, started insulin in 2012  . Gout   . Gout 04/28/2012  . Headache(784.0)    CT head 08/2011: Periventricular and subcortical white matter hypodensities are most in keeping with chronic microangiopathic change  . HIV infection [facility]) Nov 2012   Followed by [name]  . Hyperlipidemia   . Hypertension   . Pulmonary embolism [facility])     Past Surgical History:  Procedure Laterality Date  . BACK SURGERY     1988  . BOWEL RESECTION    . CARDIAC SURGERY    . CERVICAL SPINE SURGERY     " rods in my neck "  . CORONARY STENT PLACEMENT    . NM MYOCAR PERF WALL MOTION  12/27/2011   normal  . SPINE SURGERY     Family History:  Family History  Problem Relation Age of Onset  . Diabetes Mother   . Hypertension Mother   . Hyperlipidemia Mother   . Diabetes Father   . Cancer Father   . Hypertension Father   . Diabetes Brother   . Heart disease Brother   . Diabetes Sister   . Colon cancer Neg Hx    Family Psychiatric  History: substance abuse Social History:  History  Alcohol Use No     History  Drug Use  . Types: "Crack" cocaine, Cocaine    Comment: last use January 2018    Social History   Social History  . Marital status: Divorced    Spouse name: N/A  . Number of children: N/A  . Years of education: 52   Occupational History  .  Unemployed    04/2016   Social History Main Topics  . Smoking status: Never  Smoker  . Smokeless tobacco: Never Used  . Alcohol use No  . Drug use: Yes    Types: "Crack" cocaine, Cocaine     Comment: last use January 2018  . Sexual activity: Yes     Comment: accepted condoms   Other Topics Concern  . None   Social History Narrative   Currently staying with a friend in [place].  Was staying @ local motel until a few days ago - left b/c of bed bugs.   Additional Social History:    Allergies:  No Known Allergies  Labs:  Results for orders placed or performed during the hospital encounter of 12/08/16 (from the past 48 hour(s))  Rapid urine drug screen (hospital performed)     Status: Abnormal   Collection Time: 12/08/16  6:20 PM  Result Value Ref Range   Opiates NONE DETECTED NONE DETECTED   Cocaine POSITIVE (A) NONE DETECTED   Benzodiazepines NONE DETECTED NONE DETECTED   Amphetamines NONE DETECTED NONE DETECTED   Tetrahydrocannabinol NONE DETECTED NONE DETECTED   Barbiturates NONE DETECTED NONE  DETECTED    Comment:        DRUG SCREEN FOR MEDICAL PURPOSES ONLY.  IF CONFIRMATION IS NEEDED FOR ANY PURPOSE, NOTIFY LAB WITHIN 5 DAYS.        LOWEST DETECTABLE LIMITS FOR URINE DRUG SCREEN Drug Class       Cutoff (ng/mL) Amphetamine      1000 Barbiturate      200 Benzodiazepine   892 Tricyclics       119 Opiates          300 Cocaine          300 THC              50   Comprehensive metabolic panel     Status: Abnormal   Collection Time: 12/08/16  6:21 PM  Result Value Ref Range   Sodium 136 135 - 145 mmol/L   Potassium 3.6 3.5 - 5.1 mmol/L   Chloride 101 101 - 111 mmol/L   CO2 24 22 - 32 mmol/L   Glucose, Bld 258 (H) 65 - 99 mg/dL   BUN 36 (H) 6 - 20 mg/dL   Creatinine, Ser 1.82 (H) 0.61 - 1.24 mg/dL   Calcium 8.9 8.9 - 10.3 mg/dL   Total Protein 7.5 6.5 - 8.1 g/dL   Albumin 3.6 3.5 - 5.0 g/dL   AST 26 15 - 41 U/L   ALT 16 (L) 17 - 63 U/L   Alkaline Phosphatase 83 38 - 126 U/L   Total Bilirubin 0.5 0.3 - 1.2 mg/dL   GFR calc non Af Amer 37  (L) >60 mL/min   GFR calc Af Amer 43 (L) >60 mL/min    Comment: (NOTE) The eGFR has been calculated using the CKD EPI equation. This calculation has not been validated in all clinical situations. eGFR's persistently <60 mL/min signify possible Chronic Kidney Disease.    Anion gap 11 5 - 15  Ethanol     Status: None   Collection Time: 12/08/16  6:21 PM  Result Value Ref Range   Alcohol, Ethyl (B) <5 <5 mg/dL    Comment:        LOWEST DETECTABLE LIMIT FOR SERUM ALCOHOL IS 5 mg/dL FOR MEDICAL PURPOSES ONLY   Salicylate level     Status: None   Collection Time: 12/08/16  6:21 PM  Result Value Ref Range   Salicylate Lvl <4.1 2.8 - 30.0 mg/dL  Acetaminophen level     Status: Abnormal   Collection Time: 12/08/16  6:21 PM  Result Value Ref Range   Acetaminophen (Tylenol), Serum <10 (L) 10 - 30 ug/mL    Comment:        THERAPEUTIC CONCENTRATIONS VARY SIGNIFICANTLY. A RANGE OF 10-30 ug/mL MAY BE AN EFFECTIVE CONCENTRATION FOR MANY PATIENTS. HOWEVER, SOME ARE BEST TREATED AT CONCENTRATIONS OUTSIDE THIS RANGE. ACETAMINOPHEN CONCENTRATIONS >150 ug/mL AT 4 HOURS AFTER INGESTION AND >50 ug/mL AT 12 HOURS AFTER INGESTION ARE OFTEN ASSOCIATED WITH TOXIC REACTIONS.   cbc     Status: Abnormal   Collection Time: 12/08/16  6:21 PM  Result Value Ref Range   WBC 6.2 4.0 - 10.5 K/uL   RBC 4.06 (L) 4.22 - 5.81 MIL/uL   Hemoglobin 10.6 (L) 13.0 - 17.0 g/dL   HCT 32.6 (L) 39.0 - 52.0 %   MCV 80.3 78.0 - 100.0 fL   MCH 26.1 26.0 - 34.0 pg   MCHC 32.5 30.0 - 36.0 g/dL   RDW 14.5 11.5 - 15.5 %   Platelets 191  150 - 400 K/uL  I-stat troponin, ED     Status: None   Collection Time: 12/08/16  6:29 PM  Result Value Ref Range   Troponin i, poc 0.00 0.00 - 0.08 ng/mL   Comment 3            Comment: Due to the release kinetics of cTnI, a negative result within the first hours of the onset of symptoms does not rule out myocardial infarction with certainty. If myocardial infarction is still  suspected, repeat the test at appropriate intervals.   CBG monitoring, ED     Status: Abnormal   Collection Time: 12/08/16  7:59 PM  Result Value Ref Range   Glucose-Capillary 263 (H) 65 - 99 mg/dL  CBG monitoring, ED     Status: Abnormal   Collection Time: 12/09/16  9:52 AM  Result Value Ref Range   Glucose-Capillary 323 (H) 65 - 99 mg/dL    Current Facility-Administered Medications  Medication Dose Route Frequency Provider Last Rate Last Dose  . albuterol (PROVENTIL HFA;VENTOLIN HFA) 108 (90 Base) MCG/ACT inhaler 2 puff  2 puff Inhalation Q6H PRN Lacretia Leigh, MD      . allopurinol (ZYLOPRIM) tablet 200 mg  200 mg Oral Daily Lacretia Leigh, MD   200 mg at 12/09/16 1007  . alum & mag hydroxide-simeth (MAALOX/MYLANTA) 200-200-20 MG/5ML suspension 30 mL  30 mL Oral PRN Lacretia Leigh, MD      . amantadine (SYMMETREL) capsule 100 mg  100 mg Oral Daily Lacretia Leigh, MD   100 mg at 12/09/16 1007  . aspirin EC tablet 81 mg  81 mg Oral Daily Lacretia Leigh, MD   81 mg at 12/09/16 1007  . buPROPion (WELLBUTRIN XL) 24 hr tablet 150 mg  150 mg Oral Daily Lacretia Leigh, MD   150 mg at 12/09/16 1007  . clopidogrel (PLAVIX) tablet 75 mg  75 mg Oral Daily Lacretia Leigh, MD   75 mg at 12/09/16 1009  . cyclobenzaprine (FLEXERIL) tablet 10 mg  10 mg Oral TID PRN Lacretia Leigh, MD      . diclofenac sodium (VOLTAREN) 1 % transdermal gel 2 g  2 g Topical QID PRN Lacretia Leigh, MD      . diltiazem (CARDIZEM CD) 24 hr capsule 120 mg  120 mg Oral Daily Lacretia Leigh, MD   120 mg at 12/08/16 2250  . emtricitabine-rilpivir-tenofovir AF (ODEFSEY) 200-25-25 MG per tablet 1 tablet  1 tablet Oral Q breakfast Lacretia Leigh, MD   1 tablet at 12/09/16 0801  . escitalopram (LEXAPRO) tablet 5 mg  5 mg Oral Daily Lacretia Leigh, MD   5 mg at 12/09/16 1007  . famotidine (PEPCID) tablet 20 mg  20 mg Oral Daily Lacretia Leigh, MD   20 mg at 12/09/16 1007  . gabapentin (NEURONTIN) capsule 400 mg  400 mg Oral TID Lacretia Leigh,  MD   400 mg at 12/09/16 1007  . glipiZIDE (GLUCOTROL XL) 24 hr tablet 5 mg  5 mg Oral Q breakfast Lacretia Leigh, MD   5 mg at 12/09/16 0801  . insulin glargine (LANTUS) injection 25 Units  25 Units Subcutaneous QHS Lacretia Leigh, MD   25 Units at 12/08/16 2252  . isosorbide mononitrate (IMDUR) 24 hr tablet 30 mg  30 mg Oral Daily Lacretia Leigh, MD   30 mg at 12/08/16 2252  . losartan (COZAAR) tablet 50 mg  50 mg Oral Daily Lacretia Leigh, MD   50 mg at 12/09/16 1009  . metFORMIN (GLUCOPHAGE)  tablet 500 mg  500 mg Oral BID AC Lacretia Leigh, MD   500 mg at 12/09/16 0801  . metoprolol succinate (TOPROL-XL) 24 hr tablet 25 mg  25 mg Oral Daily Lacretia Leigh, MD   25 mg at 12/08/16 2248  . mirtazapine (REMERON) tablet 7.5 mg  7.5 mg Oral QHS Lacretia Leigh, MD   7.5 mg at 12/08/16 2306  . nicotine (NICODERM CQ - dosed in mg/24 hours) patch 21 mg  21 mg Transdermal Daily Lacretia Leigh, MD      . ondansetron [facility]) tablet 4 mg  4 mg Oral Q8H PRN Lacretia Leigh, MD      . pravastatin (PRAVACHOL) tablet 40 mg  40 mg Oral QHS Lacretia Leigh, MD   40 mg at 12/08/16 2249  . risperiDONE (RISPERDAL) tablet 1 mg  1 mg Oral QHS Lacretia Leigh, MD   1 mg at 12/08/16 2252   Current Outpatient Prescriptions  Medication Sig Dispense Refill  . albuterol (PROAIR HFA) 108 (90 Base) MCG/ACT inhaler Inhale 2 puffs into the lungs every 6 (six) hours as needed for wheezing or shortness of breath. 1 Inhaler 0  . allopurinol (ZYLOPRIM) 100 MG tablet Take 200 mg by mouth daily.    Marland Kitchen amantadine (SYMMETREL) 100 MG capsule Take 100 mg by mouth daily.     Marland Kitchen aspirin EC 81 MG tablet Take 81 mg by mouth daily.    Marland Kitchen buPROPion (WELLBUTRIN XL) 150 MG 24 hr tablet Take 150 mg by mouth daily.    . clopidogrel (PLAVIX) 75 MG tablet Take 75 mg by mouth daily.    . cyclobenzaprine (FLEXERIL) 10 MG tablet Take 10 mg by mouth 3 (three) times daily as needed for muscle spasms.    . diclofenac sodium (VOLTAREN) 1 % GEL Apply 2 g topically 4  (four) times daily as needed (for pain).    Marland Kitchen diltiazem (CARDIZEM CD) 120 MG 24 hr capsule Take 1 capsule (120 mg total) by mouth daily. 30 capsule 0  . DULoxetine (CYMBALTA) 30 MG capsule Take 30 mg by mouth daily.    Marland Kitchen emtricitabine-rilpivir-tenofovir DF (COMPLERA) 200-25-300 MG tablet Take 1 tablet by mouth daily. 30 tablet 0  . escitalopram (LEXAPRO) 5 MG tablet Take 1 tablet (5 mg total) by mouth daily. 30 tablet 0  . famotidine (PEPCID) 20 MG tablet Take 20 mg by mouth daily.    Marland Kitchen gabapentin (NEURONTIN) 400 MG capsule Take 400 mg by mouth 3 (three) times daily.    Marland Kitchen glipiZIDE (GLUCOTROL XL) 5 MG 24 hr tablet Take 5 mg by mouth daily with breakfast.    . insulin aspart (NOVOLOG) 100 UNIT/ML injection Inject 2-4 Units into the skin 3 (three) times daily before meals. Pt uses per sliding scale.    . insulin glargine (LANTUS) 100 unit/mL SOPN Inject 25 Units into the skin at bedtime.    . isosorbide mononitrate (IMDUR) 30 MG 24 hr tablet Take 1 tablet (30 mg total) by mouth daily. 30 tablet 0  . losartan (COZAAR) 50 MG tablet Take 1 tablet (50 mg total) by mouth daily. 14 tablet 0  . metFORMIN (GLUCOPHAGE) 500 MG tablet Take 500 mg by mouth 2 (two) times daily before a meal.    . metoprolol succinate (TOPROL-XL) 25 MG 24 hr tablet Take 1 tablet (25 mg total) by mouth daily. 30 tablet 0  . mirtazapine (REMERON) 7.5 MG tablet Take 7.5 mg by mouth at bedtime.    . pravastatin (PRAVACHOL) 40 MG tablet Take  40 mg by mouth at bedtime.    . risperiDONE (RISPERDAL) 1 MG tablet Take 1 tablet (1 mg total) by mouth at bedtime. 30 tablet 0    Musculoskeletal: Strength & Muscle Tone: within normal limits Gait & Station: normal Patient leans: N/A  Psychiatric Specialty Exam: Physical Exam  Constitutional: He is oriented to person, place, and time. He appears well-developed and well-nourished.  HENT:  Head: Normocephalic.  Neck: Normal range of motion.  Respiratory: Effort normal.  Musculoskeletal:  Normal range of motion.  Neurological: He is alert and oriented to person, place, and time.  Psychiatric: He has a normal mood and affect. His speech is normal and behavior is normal. Judgment and thought content normal. Cognition and memory are normal.    Review of Systems  Psychiatric/Behavioral: Positive for substance abuse.  All other systems reviewed and are negative.   Blood pressure 98/56, pulse (!) 58, temperature 98.4 F (36.9 C), temperature source Oral, resp. rate 14, height _0  (1.702 m), weight 78.9 kg (174 lb), SpO2 98 %.Body mass index is 27.25 kg/m.  General Appearance: Casual  Eye Contact:  Good  Speech:  Normal Rate  Volume:  Normal  Mood:  Euthymic  Affect:  Congruent  Thought Process:  Coherent and Descriptions of Associations: Intact  Orientation:  Full (Time, Place, and Person)  Thought Content:  WDL  Suicidal Thoughts:  No  Homicidal Thoughts:  No  Memory:  Immediate;   Good Recent;   Good Remote;   Good  Judgement:  Fair  Insight:  Fair  Psychomotor Activity:  Normal  Concentration:  Concentration: Good and Attention Span: Good  Recall:  Good  Fund of Knowledge:  Fair  Language:  Good  Akathisia:  No  Handed:  Right  AIMS (if indicated):     Assets:  Leisure Time Physical Health Resilience  ADL's:  Intact  Cognition:  WNL  Sleep:        Treatment Plan Summary: Daily contact with patient to assess and evaluate symptoms and progress in treatment, Medication management and Plan cocaine abuse with cocaine induced mood disorder:  -Crisis stabilization -Medication management:  Medical medications restarted along with Wellbutrin 150 mg daily for depression, Lexapro 5 mg daily for depression, Remeron 7.5 mg at bedtime for sleep, Risperdal 1 mg at bedtime for mood, Gabapentin 400 mg TID for withdrawal -Individual and substance abuse counseling  Disposition: No evidence of imminent risk to self or others at present.    Waylan Boga, NP 12/09/2016  10:46 AM  Patient seen face-to-face for psychiatric evaluation, chart reviewed and case discussed with the physician extender and developed treatment plan. Reviewed the information documented and agree with the treatment plan. Corena Pilgrim, MD

## 2016-12-13 IMAGING — CR DG CHEST 2V
2 series · 2 of 2 positions shown · non-contrast
Comparison: 10/21/2015

CLINICAL DATA: Shortness of breath and cough

EXAM:
CHEST  2 VIEW

[chest pa]
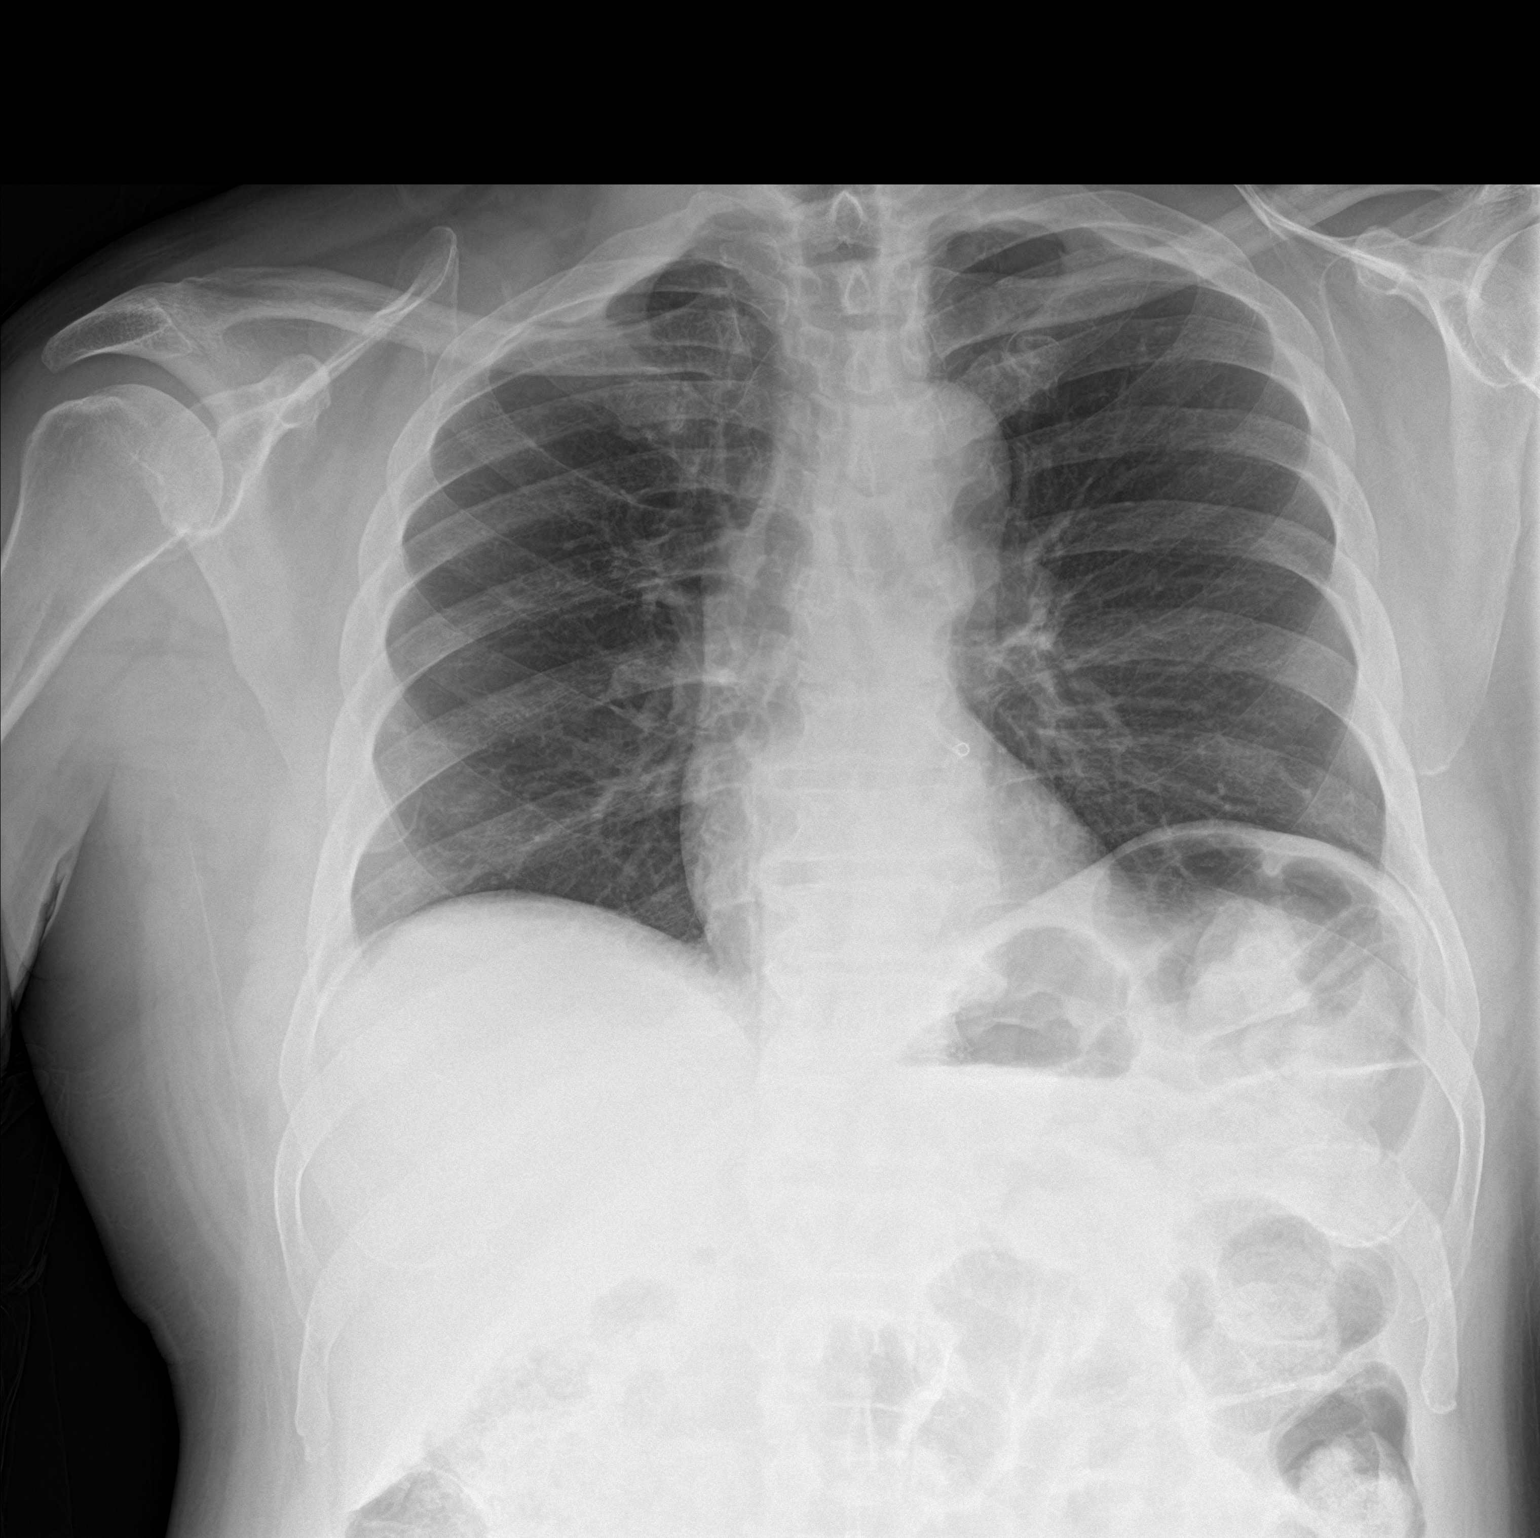

[chest lat]
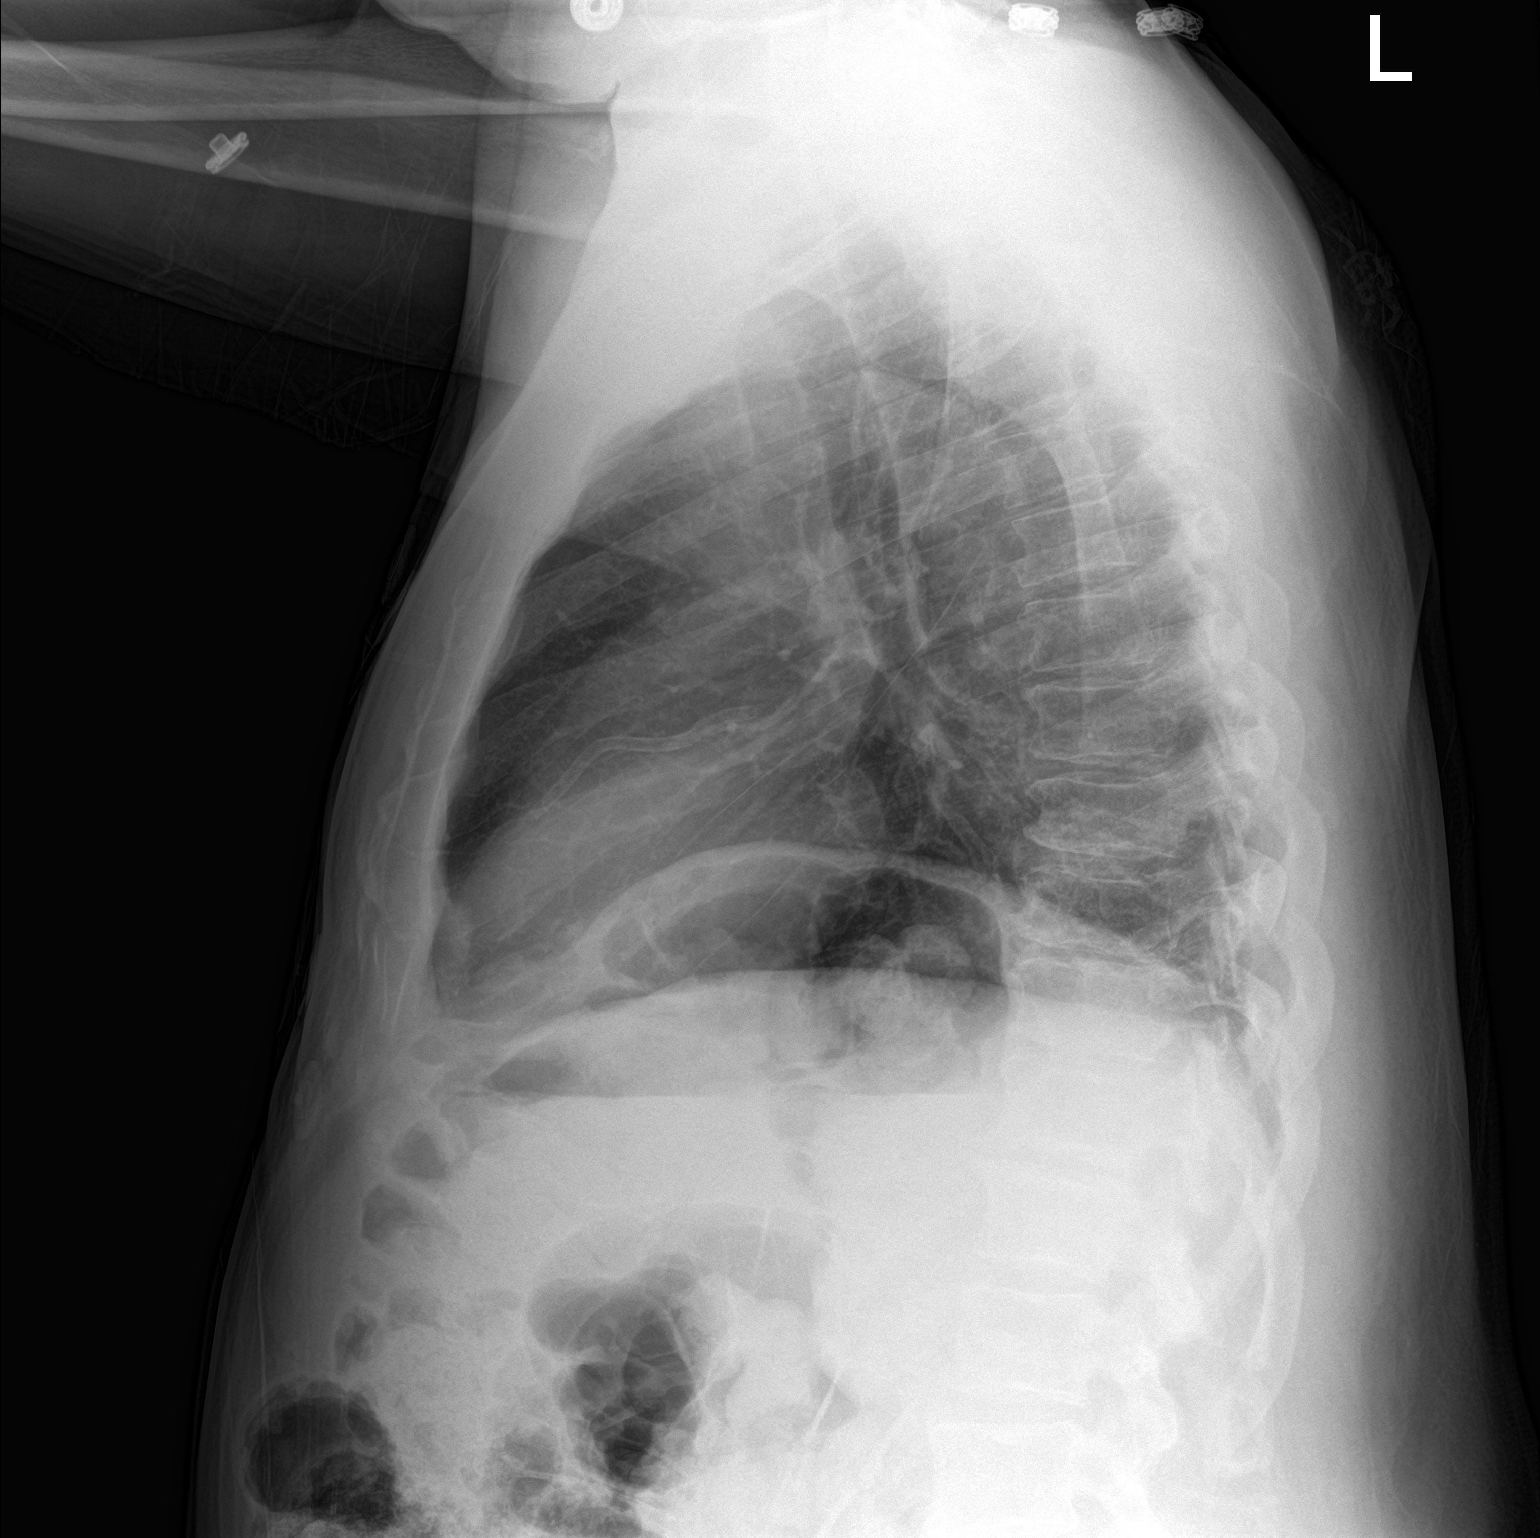

[2 of 2 positions shown; findings below may reference images not displayed]

FINDINGS: The heart size and mediastinal contours are within normal limits.
Both lungs are clear. The visualized skeletal structures are
unremarkable.
IMPRESSION: No active cardiopulmonary disease.

## 2016-12-16 ENCOUNTER — Emergency Department (HOSPITAL_COMMUNITY)
Admission: EM | Admit: 2016-12-16 | Discharge: 2016-12-16 | Disposition: A | Discharge status: Home / Self Care | Payer: Medicare Other | Attending: Emergency Medicine | Admitting: Emergency Medicine

## 2016-12-16 ENCOUNTER — Emergency Department (HOSPITAL_COMMUNITY): Payer: Medicare Other

## 2016-12-16 ENCOUNTER — Encounter (HOSPITAL_COMMUNITY): Payer: Self-pay

## 2016-12-16 DIAGNOSIS — M25562 Pain in left knee: Secondary | ICD-10-CM | POA: Insufficient documentation

## 2016-12-16 DIAGNOSIS — S161XXA Strain of muscle, fascia and tendon at neck level, initial encounter: Secondary | ICD-10-CM | POA: Insufficient documentation

## 2016-12-16 DIAGNOSIS — Y929 Unspecified place or not applicable: Secondary | ICD-10-CM | POA: Insufficient documentation

## 2016-12-16 DIAGNOSIS — Y939 Activity, unspecified: Secondary | ICD-10-CM | POA: Diagnosis not present

## 2016-12-16 DIAGNOSIS — I2510 Atherosclerotic heart disease of native coronary artery without angina pectoris: Secondary | ICD-10-CM | POA: Diagnosis not present

## 2016-12-16 DIAGNOSIS — M25512 Pain in left shoulder: Secondary | ICD-10-CM | POA: Insufficient documentation

## 2016-12-16 DIAGNOSIS — N183 Chronic kidney disease, stage 3 (moderate): Secondary | ICD-10-CM | POA: Insufficient documentation

## 2016-12-16 DIAGNOSIS — Y999 Unspecified external cause status: Secondary | ICD-10-CM | POA: Diagnosis not present

## 2016-12-16 DIAGNOSIS — S0990XA Unspecified injury of head, initial encounter: Secondary | ICD-10-CM | POA: Diagnosis not present

## 2016-12-16 DIAGNOSIS — M25561 Pain in right knee: Secondary | ICD-10-CM | POA: Insufficient documentation

## 2016-12-16 DIAGNOSIS — I129 Hypertensive chronic kidney disease with stage 1 through stage 4 chronic kidney disease, or unspecified chronic kidney disease: Secondary | ICD-10-CM | POA: Diagnosis not present

## 2016-12-16 DIAGNOSIS — Z955 Presence of coronary angioplasty implant and graft: Secondary | ICD-10-CM | POA: Diagnosis not present

## 2016-12-16 DIAGNOSIS — S4992XA Unspecified injury of left shoulder and upper arm, initial encounter: Secondary | ICD-10-CM | POA: Diagnosis not present

## 2016-12-16 DIAGNOSIS — J45909 Unspecified asthma, uncomplicated: Secondary | ICD-10-CM | POA: Insufficient documentation

## 2016-12-16 DIAGNOSIS — E119 Type 2 diabetes mellitus without complications: Secondary | ICD-10-CM | POA: Diagnosis not present

## 2016-12-16 DIAGNOSIS — T148XXA Other injury of unspecified body region, initial encounter: Secondary | ICD-10-CM | POA: Diagnosis not present

## 2016-12-16 DIAGNOSIS — S098XXA Other specified injuries of head, initial encounter: Secondary | ICD-10-CM | POA: Diagnosis not present

## 2016-12-16 DIAGNOSIS — S299XXA Unspecified injury of thorax, initial encounter: Secondary | ICD-10-CM | POA: Diagnosis not present

## 2016-12-16 DIAGNOSIS — S79911A Unspecified injury of right hip, initial encounter: Secondary | ICD-10-CM | POA: Diagnosis not present

## 2016-12-16 DIAGNOSIS — M25551 Pain in right hip: Secondary | ICD-10-CM | POA: Insufficient documentation

## 2016-12-16 DIAGNOSIS — S199XXA Unspecified injury of neck, initial encounter: Secondary | ICD-10-CM | POA: Diagnosis not present

## 2016-12-16 DIAGNOSIS — S8991XA Unspecified injury of right lower leg, initial encounter: Secondary | ICD-10-CM | POA: Diagnosis not present

## 2016-12-16 DIAGNOSIS — M542 Cervicalgia: Secondary | ICD-10-CM | POA: Diagnosis not present

## 2016-12-16 DIAGNOSIS — M25552 Pain in left hip: Secondary | ICD-10-CM | POA: Diagnosis not present

## 2016-12-16 DIAGNOSIS — W1830XA Fall on same level, unspecified, initial encounter: Secondary | ICD-10-CM | POA: Insufficient documentation

## 2016-12-16 DIAGNOSIS — W19XXXA Unspecified fall, initial encounter: Secondary | ICD-10-CM

## 2016-12-16 LAB — CBG MONITORING, ED: Glucose-Capillary: 394 mg/dL — ABNORMAL HIGH (ref 65–99)

## 2016-12-16 MED ORDER — ACETAMINOPHEN 500 MG PO TABS
1000.0000 mg | ORAL_TABLET | Freq: Once | ORAL | Status: AC
  Administered 2016-12-16: 1000 mg via ORAL
  Filled 2016-12-16: qty 2

## 2016-12-16 NOTE — Progress Notes (Signed)
CSW provided pt with taxi voucher home. 

## 2016-12-16 NOTE — ED Triage Notes (Addendum)
Pt presents via EMS s/p fall. Per EMS, was walking across the street when he fell on both knees. Pt c/o bilateral knee and hip pain, lower back pain, and neck pain. C-collar placed by EMS. Pt with hx of spinal surgery, reports chronic pain that is worse since fall. Per EMS, pt A&Ox4, pt denies LOC, unsure if on blood thinners.152/94, HR 84. CBG 425, per EMS pt reports he has not taken his insulin in 3 days. Per pt chart, pt on Plavix. Pt also reports productive cough x 3 days.

## 2016-12-16 NOTE — Discharge Instructions (Signed)

## 2016-12-16 NOTE — ED Provider Notes (Signed)
Emergency Department Provider Note   I have reviewed the triage vital signs and the nursing notes.   HISTORY  Chief Complaint Fall   HPI Michael [name] is a 66 y.o. male with PMH of a-fib, CAD, cocaine abuse, HTN, and HLD presents to the ED for evaluation of mechanical fall while crossing the street. The patient states that there is ice outside and he slipped and fell. He states that he tried to catch himself but fell backwards and may have struck his head. At this time is having pain in his neck, left shoulder, bilateral knees. He denies any preceding chest pain, palpitations, or SOB prior to falling. No vomiting since fall. He notes a cough that has been ongoing for the last 2 months with no significant improvement in symptoms. No radiation of pain. Moderate severity. Worse with movement. Improved with rest.    Past Medical History:  Diagnosis Date  . A-fib ([place])   . Asthma    No PFTs, history of childhood asthma  . CAD (coronary artery disease)    a. 06/2013 STEMI/PCI ([facility]): LAD w/ thrombus (treated with BMS), mid 75%, D2 75%; LCX OM2 75%; RCA small, PDA 95%, PLV 95%;  b. 10/2013 Cath/PCI: ISR w/in LAD (Promus DES x 2), borderline OM2 lesion;  c. 01/2014 MV: Intermediate risk, medium-sized distal ant wall infarct w/ very small amt of peri-infarct ischemia. EF 60%.  . Cellulitis 04/2014   left facial  . Chondromalacia of medial femoral condyle    Left knee MRI 04/28/12: Chondromalacia of the medial femoral condyle with slight peripheral degeneration of the meniscocapsular junction of the medial meniscus; followed by sports medicine  . Collagen vascular disease ([place])   . Crack cocaine use    for 20+ years, has been enrolled in detox programs in the past  . Depression    with history of hospitalization for suicidal ideation  . Diabetes mellitus 2002   Diagnosed in 2002, started insulin in 2012  . Gout   . Gout 04/28/2012  . Headache(784.0)    CT head 08/2011: Periventricular and  subcortical white matter hypodensities are most in keeping with chronic microangiopathic change  . HIV infection [facility]) Nov 2012   Followed by [name]  . Hyperlipidemia   . Hypertension   . Pulmonary embolism [facility])     Patient Active Problem List   Diagnosis Date Noted  . Substance induced mood disorder ([place]) 11/23/2016  . Back pain 04/18/2016  . S/P carotid endarterectomy 11/15/2015  . MDD (major depressive disorder), recurrent severe, without psychosis ([place]) 09/09/2015  . Cocaine dependence with cocaine-induced mood disorder ([place])   . Cocaine-induced mood disorder ([employer]) 08/14/2015  . Gout 07/10/2015  . Cough 04/26/2015  . Lactic acidosis 03/06/2015  . CKD (chronic kidney disease) stage 3, GFR 30-59 ml/min 03/06/2015  . Normocytic anemia 03/06/2015  . Hypoglycemia   . Encounter for general adult medical examination with abnormal findings 02/09/2015  . Cocaine use disorder, severe, dependence ([place]) 12/13/2014  . Substance or medication-induced depressive disorder with onset during withdrawal ([place]) 12/13/2014  . Cervicalgia 06/28/2014  . Lumbar radiculopathy, chronic 06/28/2014  . Asthma, chronic 02/03/2014  . 3-vessel CAD 06/24/2013  . ED (erectile dysfunction) of organic origin 07/07/2012  . Hypertension goal BP (blood pressure) < 140/80 04/29/2012  . Chondromalacia of left knee 03/19/2012  . Hyperlipidemia with target LDL less than 100 02/12/2012  . Fibromyalgia 02/12/2012  . HIV (human immunodeficiency virus infection) ([place]) 08/27/2011  . Uncontrolled type 2  diabetes with neuropathy ([place]) 10/17/2000    Past Surgical History:  Procedure Laterality Date  . BACK SURGERY     1988  . BOWEL RESECTION    . CARDIAC SURGERY    . CERVICAL SPINE SURGERY     " rods in my neck "  . CORONARY STENT PLACEMENT    . NM MYOCAR PERF WALL MOTION  12/27/2011   normal  . SPINE SURGERY      Current Outpatient Rx  . Order #: [id number] Class: Normal  . Order #: [id number] Class:  Historical Med  . Order #: [id number] Class: Historical Med  . Order #: [id number] Class: Historical Med  . Order #: [id number] Class: Historical Med  . Order #: [id number] Class: Historical Med  . Order #: [id number] Class: Historical Med  . Order #: [id number] Class: Historical Med  . Order #: [id number] Class: Normal  . Order #: [id number] Class: Historical Med  . Order #: [id number] Class: Normal  . Order #: [id number] Class: Normal  . Order #: [id number] Class: Historical Med  . Order #: [id number] Class: Historical Med  . Order #: [id number] Class: Historical Med  . Order #: [id number] Class: Historical Med  . Order #: [id number] Class: Historical Med  . Order #: [id number] Class: Normal  . Order #: [id number] Class: Normal  . Order #: [id number] Class: Historical Med  . Order #: [id number] Class: Normal  . Order #: [id number] Class: Historical Med  . Order #: [id number] Class: Historical Med  . Order #: [id number] Class: Normal    Allergies Patient has no known allergies.  Family History  Problem Relation Age of Onset  . Diabetes Mother   . Hypertension Mother   . Hyperlipidemia Mother   . Diabetes Father   . Cancer Father   . Hypertension Father   . Diabetes Brother   . Heart disease Brother   . Diabetes Sister   . Colon cancer Neg Hx     Social History Social History  Substance Use Topics  . Smoking status: Never Smoker  . Smokeless tobacco: Never Used  . Alcohol use No    Review of Systems  Constitutional: No fever/chills Eyes: No visual changes. ENT: No sore throat. Cardiovascular: Denies chest pain. Respiratory: Denies shortness of breath. Gastrointestinal: No abdominal pain. No nausea, no vomiting. No diarrhea. No constipation. Genitourinary: Negative for dysuria.  Musculoskeletal: Negative for back pain. Positive neck pain and bilateral knee pain.  Skin: Negative for rash. Neurological: Negative for focal weakness or numbness. Positive HA.   10-point ROS otherwise  negative.  ____________________________________________   PHYSICAL EXAM:  VITAL SIGNS: ED Triage Vitals  Enc Vitals Group     BP 12/16/16 1348 144/84     Pulse Rate 12/16/16 1348 90     Resp 12/16/16 1348 16     Temp 12/16/16 1348 98.3 F (36.8 C)     Temp Source 12/16/16 1348 Oral     SpO2 12/16/16 1348 100 %     Weight 12/16/16 1340 174 lb (78.9 kg)     Height 12/16/16 1340 5\' 7"  (1.702 m)     Pain Score 12/16/16 1348 9   Constitutional: Alert and oriented. Well appearing and in no acute distress. Eyes: Conjunctivae are normal.  Head: Atraumatic. Nose: No congestion/rhinnorhea. Mouth/Throat: Mucous membranes are moist.   Neck: No stridor. C-collar in place.  Cardiovascular: Normal rate, regular rhythm. Good peripheral circulation. Grossly normal heart sounds.   Respiratory: Normal respiratory effort.  No retractions. Lungs CTAB. Gastrointestinal: Soft and nontender. No distention.  Musculoskeletal: Bilateral hip and knee tenderness to palpation. No obvious deformity. Neurovascularly intact  upper and lower extremities. Mild pain with ROM of the left shoulder.  Neurologic:  Normal speech and language. No gross focal neurologic deficits are appreciated.  Skin:  Skin is warm, dry and intact. No rash noted. Abrasion to right knee.  Psychiatric: Mood and affect are normal. Speech and behavior are normal.  ____________________________________________   LABS (all labs ordered are listed, but only abnormal results are displayed)  Labs Reviewed  CBG MONITORING, ED - Abnormal; Notable for the following:       Result Value   Glucose-Capillary 394 (*)    All other components within normal limits   ____________________________________________  RADIOLOGY  Dg Chest 2 View  Result Date: 12/16/2016 CLINICAL DATA:  Fall.  Initial encounter. EXAM: CHEST  2 VIEW COMPARISON:  12/08/2016 FINDINGS: The cardiomediastinal silhouette is within normal limits. The lungs are clear. No pleural  effusion or pneumothorax is identified. Coronary artery stenting and cervical spine fusion are noted. No acute osseous abnormality is seen. IMPRESSION: No active cardiopulmonary disease. Electronically Signed   By: Logan Bores M.D.   On: 12/16/2016 15:04   Dg Knee 2 Views Left  Result Date: 12/16/2016 CLINICAL DATA:  Pain after fall EXAM: LEFT KNEE - 1-2 VIEW COMPARISON:  None. FINDINGS: No evidence of fracture, dislocation, or joint effusion. No evidence of arthropathy or other focal bone abnormality. Soft tissues are unremarkable. IMPRESSION: Negative. Electronically Signed   By: Dorise Bullion III M.D   On: 12/16/2016 15:07   Dg Knee 2 Views Right  Result Date: 12/16/2016 CLINICAL DATA:  Pain after fall. EXAM: RIGHT KNEE - 1-2 VIEW COMPARISON:  None. FINDINGS: No evidence of fracture, dislocation, or joint effusion. No evidence of arthropathy or other focal bone abnormality. Soft tissues are unremarkable. IMPRESSION: Negative. Electronically Signed   By: Dorise Bullion III M.D   On: 12/16/2016 15:06   Ct Head Wo Contrast  Result Date: 12/16/2016 CLINICAL DATA:  Fall EXAM: CT HEAD WITHOUT CONTRAST CT CERVICAL SPINE WITHOUT CONTRAST TECHNIQUE: Multidetector CT imaging of the head and cervical spine was performed following the standard protocol without intravenous contrast. Multiplanar CT image reconstructions of the cervical spine were also generated. COMPARISON:  03/09/2016, 12/08/2015 FINDINGS: CT HEAD FINDINGS Brain: No acute territorial infarction, intracranial hemorrhage, or focal mass lesion is visualized. Mild to moderate atrophy. Moderate periventricular white matter small vessel ischemic changes. Ventricles are stable in size. Stable small midline lipoma Vascular: No hyperdense vessels. Scattered calcifications at the carotid siphons. Skull: Normal. Negative for fracture or focal lesion. Sinuses/Orbits: Mild mucosal thickening in the ethmoid and maxillary sinuses. No acute orbital  abnormality. Other: None CT CERVICAL SPINE FINDINGS Alignment: Straightening.  Facet alignment within normal limits. Skull base and vertebrae: No acute fracture. No primary bone lesion or focal pathologic process. Soft tissues and spinal canal: No prevertebral fluid or swelling. No visible canal hematoma. Disc levels: Surgical plate and screw fixation from C3 through C7. Solid bony fusion at C4-C5. Interbody devices at C3-C4, C5-C6, and C6-C7. Mild diffuse canal stenosis from C3 through C7. Posterior facet arthropathy at multiple levels. Upper chest: Grossly clear lung apices. Thyroid within normal limits. Other: None IMPRESSION: 1. No definite CT evidence for acute intracranial abnormality. Atrophy and white matter small vessel ischemic changes 2. Straightening of the cervical spine. Patient is status post anterior surgical plate and screw fixation C3 through C7. No acute fracture or malalignment. Electronically Signed   By: Donavan Foil M.D.   On: 12/16/2016 15:57   Ct Cervical Spine  Wo Contrast  Result Date: 12/16/2016 CLINICAL DATA:  Fall EXAM: CT HEAD WITHOUT CONTRAST CT CERVICAL SPINE WITHOUT CONTRAST TECHNIQUE: Multidetector CT imaging of the head and cervical spine was performed following the standard protocol without intravenous contrast. Multiplanar CT image reconstructions of the cervical spine were also generated. COMPARISON:  03/09/2016, 12/08/2015 FINDINGS: CT HEAD FINDINGS Brain: No acute territorial infarction, intracranial hemorrhage, or focal mass lesion is visualized. Mild to moderate atrophy. Moderate periventricular white matter small vessel ischemic changes. Ventricles are stable in size. Stable small midline lipoma Vascular: No hyperdense vessels. Scattered calcifications at the carotid siphons. Skull: Normal. Negative for fracture or focal lesion. Sinuses/Orbits: Mild mucosal thickening in the ethmoid and maxillary sinuses. No acute orbital abnormality. Other: None CT CERVICAL SPINE  FINDINGS Alignment: Straightening.  Facet alignment within normal limits. Skull base and vertebrae: No acute fracture. No primary bone lesion or focal pathologic process. Soft tissues and spinal canal: No prevertebral fluid or swelling. No visible canal hematoma. Disc levels: Surgical plate and screw fixation from C3 through C7. Solid bony fusion at C4-C5. Interbody devices at C3-C4, C5-C6, and C6-C7. Mild diffuse canal stenosis from C3 through C7. Posterior facet arthropathy at multiple levels. Upper chest: Grossly clear lung apices. Thyroid within normal limits. Other: None IMPRESSION: 1. No definite CT evidence for acute intracranial abnormality. Atrophy and white matter small vessel ischemic changes 2. Straightening of the cervical spine. Patient is status post anterior surgical plate and screw fixation C3 through C7. No acute fracture or malalignment. Electronically Signed   By: Donavan Foil M.D.   On: 12/16/2016 15:57   Dg Shoulder Left  Result Date: 12/16/2016 CLINICAL DATA:  66 y/o  M; status post fall with pain. EXAM: LEFT SHOULDER - 2+ VIEW COMPARISON:  None. FINDINGS: There is no evidence of fracture or dislocation. Mild osteoarthrosis of the acromioclavicular joint with osteophytosis. IMPRESSION: No acute fracture or dislocation identified. Mild left acromioclavicular joint osteoarthrosis. Electronically Signed   By: Kristine Garbe M.D.   On: 12/16/2016 15:06   Dg Hips Bilat With Pelvis 3-4 Views  Result Date: 12/16/2016 CLINICAL DATA:  Pain after fall EXAM: DG HIP (WITH OR WITHOUT PELVIS) 3-4V BILAT COMPARISON:  None. FINDINGS: There are mild degenerative changes in the right hip and moderate to severe degenerative changes in the left hip. No fractures. IMPRESSION: Mild degenerative changes in the right hip and moderate degenerative changes in the left hip. No fracture or dislocation. Electronically Signed   By: Dorise Bullion III M.D   On: 12/16/2016 15:08     ____________________________________________   PROCEDURES  Procedure(s) performed:   Procedures  None ____________________________________________   INITIAL IMPRESSION / ASSESSMENT AND PLAN / ED COURSE  Pertinent labs & imaging results that were available during my care of the patient were reviewed by me and considered in my medical decision making (see chart for details).  Patient presents to the emergency department for evaluation of mechanical fall on the street today. He is having pain in his neck and bilateral knees. He has some tenderness with range of motion of both hips. Abdomen is soft and nontender. Patient is also experiencing a cough is ongoing for 2 months. Plan for imaging as ordered above and reassessment. No syncope/pre-syncope symptoms.    04:02 PM CT scan and x-rays reviewed. No evidence of acute fracture or dislocation. Patient is awake and alert. He is feeling better. Cervical collar removed. Discussed return precautions in detail. Will use OTC medication for pain.   At this  time, I do not feel there is any life-threatening condition present. I have reviewed and discussed all results (EKG, imaging, lab, urine as appropriate), exam findings with patient. I have reviewed nursing notes and appropriate previous records.  I feel the patient is safe to be discharged home without further emergent workup. Discussed usual and customary return precautions. Patient and family (if present) verbalize understanding and are comfortable with this plan.  Patient will follow-up with their primary care provider. If they do not have a primary care provider, information for follow-up has been provided to them. All questions have been answered.  ____________________________________________  FINAL CLINICAL IMPRESSION(S) / ED DIAGNOSES  Final diagnoses:  Fall  Injury of head, initial encounter  Strain of neck muscle, initial encounter  Acute pain of left shoulder  Acute pain of  both knees  Bilateral hip pain     MEDICATIONS GIVEN DURING THIS VISIT:  Medications  acetaminophen (TYLENOL) tablet 1,000 mg (not administered)     NEW OUTPATIENT MEDICATIONS STARTED DURING THIS VISIT:  None   Note:  This document was prepared using Dragon voice recognition software and may include unintentional dictation errors.  Nanda Quinton, MD Emergency Medicine   Margette Fast, MD 12/16/16 [phone]

## 2016-12-16 NOTE — ED Notes (Signed)
Patient transported to X-ray 

## 2016-12-16 NOTE — ED Notes (Signed)
ED Provider at bedside. 

## 2016-12-16 NOTE — ED Notes (Signed)
C-collar removed by Dr Long

## 2016-12-19 ENCOUNTER — Emergency Department (HOSPITAL_COMMUNITY): Payer: Medicare Other

## 2016-12-19 ENCOUNTER — Emergency Department (HOSPITAL_COMMUNITY)
Admission: EM | Admit: 2016-12-19 | Discharge: 2016-12-19 | Disposition: A | Discharge status: Home / Self Care | Payer: Medicare Other | Attending: Emergency Medicine | Admitting: Emergency Medicine

## 2016-12-19 DIAGNOSIS — Z7982 Long term (current) use of aspirin: Secondary | ICD-10-CM | POA: Insufficient documentation

## 2016-12-19 DIAGNOSIS — F1424 Cocaine dependence with cocaine-induced mood disorder: Secondary | ICD-10-CM | POA: Diagnosis not present

## 2016-12-19 DIAGNOSIS — I129 Hypertensive chronic kidney disease with stage 1 through stage 4 chronic kidney disease, or unspecified chronic kidney disease: Secondary | ICD-10-CM | POA: Diagnosis not present

## 2016-12-19 DIAGNOSIS — E1122 Type 2 diabetes mellitus with diabetic chronic kidney disease: Secondary | ICD-10-CM | POA: Diagnosis not present

## 2016-12-19 DIAGNOSIS — J45909 Unspecified asthma, uncomplicated: Secondary | ICD-10-CM | POA: Insufficient documentation

## 2016-12-19 DIAGNOSIS — I2510 Atherosclerotic heart disease of native coronary artery without angina pectoris: Secondary | ICD-10-CM | POA: Diagnosis not present

## 2016-12-19 DIAGNOSIS — E1140 Type 2 diabetes mellitus with diabetic neuropathy, unspecified: Secondary | ICD-10-CM | POA: Diagnosis not present

## 2016-12-19 DIAGNOSIS — Z955 Presence of coronary angioplasty implant and graft: Secondary | ICD-10-CM | POA: Diagnosis not present

## 2016-12-19 DIAGNOSIS — N183 Chronic kidney disease, stage 3 (moderate): Secondary | ICD-10-CM | POA: Insufficient documentation

## 2016-12-19 DIAGNOSIS — Z794 Long term (current) use of insulin: Secondary | ICD-10-CM | POA: Diagnosis not present

## 2016-12-19 DIAGNOSIS — R05 Cough: Secondary | ICD-10-CM | POA: Diagnosis not present

## 2016-12-19 DIAGNOSIS — R079 Chest pain, unspecified: Secondary | ICD-10-CM | POA: Diagnosis not present

## 2016-12-19 DIAGNOSIS — F1420 Cocaine dependence, uncomplicated: Secondary | ICD-10-CM | POA: Diagnosis present

## 2016-12-19 DIAGNOSIS — Z79899 Other long term (current) drug therapy: Secondary | ICD-10-CM | POA: Diagnosis not present

## 2016-12-19 DIAGNOSIS — F1423 Cocaine dependence with withdrawal: Secondary | ICD-10-CM | POA: Diagnosis present

## 2016-12-19 DIAGNOSIS — I10 Essential (primary) hypertension: Secondary | ICD-10-CM | POA: Diagnosis not present

## 2016-12-19 LAB — CBC WITH DIFFERENTIAL/PLATELET
BASOS ABS: 0 10*3/uL (ref 0.0–0.1)
BASOS PCT: 0 %
EOS ABS: 0 10*3/uL (ref 0.0–0.7)
EOS PCT: 1 %
HCT: 33.9 % — ABNORMAL LOW (ref 39.0–52.0)
Hemoglobin: 11.1 g/dL — ABNORMAL LOW (ref 13.0–17.0)
Lymphocytes Relative: 33 %
Lymphs Abs: 1 10*3/uL (ref 0.7–4.0)
MCH: 26.4 pg (ref 26.0–34.0)
MCHC: 32.7 g/dL (ref 30.0–36.0)
MCV: 80.5 fL (ref 78.0–100.0)
Monocytes Absolute: 0.3 10*3/uL (ref 0.1–1.0)
Monocytes Relative: 10 %
NEUTROS PCT: 56 %
Neutro Abs: 1.7 10*3/uL (ref 1.7–7.7)
PLATELETS: 146 10*3/uL — AB (ref 150–400)
RBC: 4.21 MIL/uL — AB (ref 4.22–5.81)
RDW: 14.6 % (ref 11.5–15.5)
WBC: 3.1 10*3/uL — ABNORMAL LOW (ref 4.0–10.5)

## 2016-12-19 LAB — COMPREHENSIVE METABOLIC PANEL
ALT: 17 U/L (ref 17–63)
ANION GAP: 7 (ref 5–15)
AST: 23 U/L (ref 15–41)
Albumin: 3.9 g/dL (ref 3.5–5.0)
Alkaline Phosphatase: 75 U/L (ref 38–126)
BUN: 24 mg/dL — ABNORMAL HIGH (ref 6–20)
CHLORIDE: 105 mmol/L (ref 101–111)
CO2: 24 mmol/L (ref 22–32)
Calcium: 9 mg/dL (ref 8.9–10.3)
Creatinine, Ser: 1.43 mg/dL — ABNORMAL HIGH (ref 0.61–1.24)
GFR calc non Af Amer: 50 mL/min — ABNORMAL LOW (ref >60)
GFR, EST AFRICAN AMERICAN: 57 mL/min — AB (ref >60)
Glucose, Bld: 324 mg/dL — ABNORMAL HIGH (ref 65–99)
Potassium: 4 mmol/L (ref 3.5–5.1)
SODIUM: 136 mmol/L (ref 135–145)
Total Bilirubin: 0.7 mg/dL (ref 0.3–1.2)
Total Protein: 8.1 g/dL (ref 6.5–8.1)

## 2016-12-19 LAB — RAPID URINE DRUG SCREEN, HOSP PERFORMED
Amphetamines: NOT DETECTED
Barbiturates: NOT DETECTED
Benzodiazepines: NOT DETECTED
COCAINE: POSITIVE — AB
Opiates: NOT DETECTED
Tetrahydrocannabinol: NOT DETECTED

## 2016-12-19 LAB — SALICYLATE LEVEL: Salicylate Lvl: <7 mg/dL (ref 2.8–30.0)

## 2016-12-19 LAB — ETHANOL: Alcohol, Ethyl (B): <5 mg/dL (ref <5)

## 2016-12-19 LAB — ACETAMINOPHEN LEVEL

## 2016-12-19 LAB — I-STAT TROPONIN, ED: TROPONIN I, POC: 0 ng/mL (ref 0.00–0.08)

## 2016-12-19 LAB — CBG MONITORING, ED: Glucose-Capillary: 320 mg/dL — ABNORMAL HIGH (ref 65–99)

## 2016-12-19 MED ORDER — CLOPIDOGREL BISULFATE 75 MG PO TABS: 75.0000 mg | ORAL_TABLET | Freq: Every day | ORAL | Status: DC

## 2016-12-19 MED ORDER — RIVAROXABAN 15 MG PO TABS
15.0000 mg | ORAL_TABLET | Freq: Once | ORAL | Status: AC
  Administered 2016-12-19: 15 mg via ORAL
  Filled 2016-12-19: qty 1

## 2016-12-19 MED ORDER — INSULIN GLARGINE 100 UNIT/ML ~~LOC~~ SOLN
25.0000 [IU] | Freq: Every day | SUBCUTANEOUS | Status: DC
  Filled 2016-12-19: qty 0.25

## 2016-12-19 MED ORDER — GABAPENTIN 400 MG PO CAPS
400.0000 mg | ORAL_CAPSULE | Freq: Three times a day (TID) | ORAL | Status: DC
  Administered 2016-12-19: 400 mg via ORAL
  Filled 2016-12-19: qty 1

## 2016-12-19 MED ORDER — EMTRICITAB-RILPIVIR-TENOFOV AF 200-25-25 MG PO TABS
1.0000 | ORAL_TABLET | Freq: Every day | ORAL | Status: DC
  Administered 2016-12-19: 1 via ORAL
  Filled 2016-12-19: qty 1

## 2016-12-19 MED ORDER — GLIPIZIDE ER 5 MG PO TB24
5.0000 mg | ORAL_TABLET | Freq: Every day | ORAL | Status: DC
  Administered 2016-12-19: 5 mg via ORAL
  Filled 2016-12-19: qty 1

## 2016-12-19 MED ORDER — METOPROLOL SUCCINATE ER 25 MG PO TB24
25.0000 mg | ORAL_TABLET | Freq: Every day | ORAL | Status: DC
  Administered 2016-12-19: 25 mg via ORAL
  Filled 2016-12-19: qty 1

## 2016-12-19 MED ORDER — ALLOPURINOL 100 MG PO TABS
200.0000 mg | ORAL_TABLET | Freq: Every day | ORAL | Status: DC
  Administered 2016-12-19: 200 mg via ORAL
  Filled 2016-12-19: qty 2

## 2016-12-19 MED ORDER — CYCLOBENZAPRINE HCL 10 MG PO TABS: 10.0000 mg | ORAL_TABLET | Freq: Three times a day (TID) | ORAL | Status: DC | PRN

## 2016-12-19 MED ORDER — DILTIAZEM HCL ER COATED BEADS 120 MG PO CP24
120.0000 mg | ORAL_CAPSULE | Freq: Every day | ORAL | Status: DC
  Administered 2016-12-19: 120 mg via ORAL
  Filled 2016-12-19: qty 1

## 2016-12-19 MED ORDER — LOSARTAN POTASSIUM 50 MG PO TABS
50.0000 mg | ORAL_TABLET | Freq: Every day | ORAL | Status: DC
  Administered 2016-12-19: 50 mg via ORAL
  Filled 2016-12-19: qty 1

## 2016-12-19 MED ORDER — ESCITALOPRAM OXALATE 10 MG PO TABS
5.0000 mg | ORAL_TABLET | Freq: Every day | ORAL | Status: DC
  Administered 2016-12-19: 5 mg via ORAL
  Filled 2016-12-19: qty 1

## 2016-12-19 MED ORDER — AMANTADINE HCL 100 MG PO CAPS
100.0000 mg | ORAL_CAPSULE | Freq: Every day | ORAL | Status: DC
  Administered 2016-12-19: 100 mg via ORAL
  Filled 2016-12-19: qty 1

## 2016-12-19 MED ORDER — FAMOTIDINE 20 MG PO TABS
20.0000 mg | ORAL_TABLET | Freq: Every day | ORAL | Status: DC
  Administered 2016-12-19: 20 mg via ORAL
  Filled 2016-12-19: qty 1

## 2016-12-19 MED ORDER — INSULIN ASPART 100 UNIT/ML ~~LOC~~ SOLN: 2.0000 [IU] | Freq: Three times a day (TID) | SUBCUTANEOUS | Status: DC

## 2016-12-19 MED ORDER — RISPERIDONE 1 MG PO TABS: 1.0000 mg | ORAL_TABLET | Freq: Every day | ORAL | Status: DC

## 2016-12-19 MED ORDER — INSULIN ASPART 100 UNIT/ML ~~LOC~~ SOLN
0.0000 [IU] | Freq: Three times a day (TID) | SUBCUTANEOUS | Status: DC
  Administered 2016-12-19: 11 [IU] via SUBCUTANEOUS
  Filled 2016-12-19: qty 1

## 2016-12-19 MED ORDER — ONDANSETRON HCL 4 MG PO TABS: 4.0000 mg | ORAL_TABLET | Freq: Three times a day (TID) | ORAL | Status: DC | PRN

## 2016-12-19 MED ORDER — ACETAMINOPHEN 325 MG PO TABS: 650.0000 mg | ORAL_TABLET | Freq: Four times a day (QID) | ORAL | Status: DC | PRN

## 2016-12-19 MED ORDER — DULOXETINE HCL 30 MG PO CPEP
30.0000 mg | ORAL_CAPSULE | Freq: Every day | ORAL | Status: DC
  Administered 2016-12-19: 30 mg via ORAL
  Filled 2016-12-19: qty 1

## 2016-12-19 MED ORDER — ISOSORBIDE MONONITRATE ER 30 MG PO TB24
30.0000 mg | ORAL_TABLET | Freq: Every day | ORAL | Status: DC
  Administered 2016-12-19: 30 mg via ORAL
  Filled 2016-12-19: qty 1

## 2016-12-19 MED ORDER — ASPIRIN EC 81 MG PO TBEC: 81.0000 mg | DELAYED_RELEASE_TABLET | Freq: Every day | ORAL | Status: DC

## 2016-12-19 MED ORDER — MIRTAZAPINE 7.5 MG PO TABS: 7.5000 mg | ORAL_TABLET | Freq: Every day | ORAL | Status: DC

## 2016-12-19 MED ORDER — BUPROPION HCL ER (XL) 150 MG PO TB24
150.0000 mg | ORAL_TABLET | Freq: Every day | ORAL | Status: DC
  Administered 2016-12-19: 150 mg via ORAL
  Filled 2016-12-19: qty 1

## 2016-12-19 MED ORDER — ALBUTEROL SULFATE HFA 108 (90 BASE) MCG/ACT IN AERS: 2.0000 | INHALATION_SPRAY | Freq: Four times a day (QID) | RESPIRATORY_TRACT | Status: DC | PRN

## 2016-12-19 MED ORDER — NICOTINE 21 MG/24HR TD PT24: 21.0000 mg | MEDICATED_PATCH | Freq: Every day | TRANSDERMAL | Status: DC

## 2016-12-19 MED ORDER — PRAVASTATIN SODIUM 40 MG PO TABS: 40.0000 mg | ORAL_TABLET | Freq: Every day | ORAL | Status: DC

## 2016-12-19 MED ORDER — METFORMIN HCL 500 MG PO TABS
500.0000 mg | ORAL_TABLET | Freq: Two times a day (BID) | ORAL | Status: DC
  Administered 2016-12-19: 500 mg via ORAL
  Filled 2016-12-19: qty 1

## 2016-12-19 NOTE — BH Assessment (Addendum)
Assessment Note  Michael [name] is an 66 y.o. male. He presents to [facility], voluntarily. Patient has a complaint of suicidal ideations, no plan. Onset of suicidal thoughts started "yrs ago".  He feels suicidal because of his health issues. He is also homeless and staying in local shelters. He has a history of prior suicide attempts consisting of walking out In traffic and cutting self. The last attempt of cutting himself was a month ago. He does not recall the trigger for his previous attempt.Today patient has depressive symptoms including homelessness, loss of interest in usual pleasures, and fatigue. No family history of mental illness. No HI. No history of violent behaviors. Denies legal issues. He has auditory hallucinations that tell him to hurt himself. He also reports visual hallucinations of shadows walking across the room. He denies alcohol use. He uses cocaine 2-3 x's per month. He spends $200 per use.  Last use was today. He has received treatment at [facility] [place] yrs ago. He does not have a current outpatient therapist or psychiatrist. No history of physical, sexual, and/or emotional abuse. No support system reported.  Diagnosis: Major Depressive Disorder, Recurrent, Severe, with psychotic features; Cocaine Abuse  Past Medical History:  Past Medical History:  Diagnosis Date  . A-fib ([place])   . Asthma    No PFTs, history of childhood asthma  . CAD (coronary artery disease)    a. 06/2013 STEMI/PCI ([facility]): LAD w/ thrombus (treated with BMS), mid 75%, D2 75%; LCX OM2 75%; RCA small, PDA 95%, PLV 95%;  b. 10/2013 Cath/PCI: ISR w/in LAD (Promus DES x 2), borderline OM2 lesion;  c. 01/2014 MV: Intermediate risk, medium-sized distal ant wall infarct w/ very small amt of peri-infarct ischemia. EF 60%.  . Cellulitis 04/2014   left facial  . Chondromalacia of medial femoral condyle    Left knee MRI 04/28/12: Chondromalacia of the medial femoral condyle with slight peripheral degeneration of the meniscocapsular  junction of the medial meniscus; followed by sports medicine  . Collagen vascular disease ([place])   . Crack cocaine use    for 20+ years, has been enrolled in detox programs in the past  . Depression    with history of hospitalization for suicidal ideation  . Diabetes mellitus 2002   Diagnosed in 2002, started insulin in 2012  . Gout   . Gout 04/28/2012  . Headache(784.0)    CT head 08/2011: Periventricular and subcortical white matter hypodensities are most in keeping with chronic microangiopathic change  . HIV infection [facility]) Nov 2012   Followed by [name]  . Hyperlipidemia   . Hypertension   . Pulmonary embolism [facility])     Past Surgical History:  Procedure Laterality Date  . BACK SURGERY     1988  . BOWEL RESECTION    . CARDIAC SURGERY    . CERVICAL SPINE SURGERY     " rods in my neck "  . CORONARY STENT PLACEMENT    . NM MYOCAR PERF WALL MOTION  12/27/2011   normal  . SPINE SURGERY      Family History:  Family History  Problem Relation Age of Onset  . Diabetes Mother   . Hypertension Mother   . Hyperlipidemia Mother   . Diabetes Father   . Cancer Father   . Hypertension Father   . Diabetes Brother   . Heart disease Brother   . Diabetes Sister   . Colon cancer Neg Hx     Social History:  reports that he has  never smoked. He has never used smokeless tobacco. He reports that he uses drugs, including "Crack" cocaine and Cocaine. He reports that he does not drink alcohol.  Additional Social History:  Alcohol / Drug Use Pain Medications: See MAR Prescriptions: See MAR  Over the Counter: See MAR History of alcohol / drug use?: Yes Longest period of sobriety (when/how long): 3 yrs Negative Consequences of Use: Financial, Legal, Personal relationships Substance #1 Name of Substance 1: Crack Cocaine 1 - Age of First Use: 66 yrs old  1 - Amount (size/oz): Pt reported, smoking $200-300 worth of crack today.  1 - Frequency: 2-3 times per month 1 - Duration:  on-going  1 - Last Use / Amount: Pt reported, today. $200  CIWA: CIWA-Ar BP: 115/64 Pulse Rate: 95 COWS:    Allergies: No Known Allergies  Home Medications:  (Not in a hospital admission)  OB/GYN Status:  No LMP for male patient.  General Assessment Data [facility] of Assessment: [facility] TTS Assessment: In system Is this a Tele or Face-to-Face Assessment?: Face-to-Face Is this an Initial Assessment or a Re-assessment for this encounter?: Initial Assessment Marital status: Divorced [place] name:  (n/a) Is patient pregnant?: No Pregnancy Status: No Living Arrangements: Alone Can pt return to current living arrangement?: Yes Admission Status: Voluntary Is patient capable of signing voluntary admission?: Yes Referral Source: Self/Family/Friend Insurance type:  Secretary/administrator)     Crisis Care Plan Living Arrangements: Alone Legal Guardian: Other: (no legal guardian ) Name of Psychiatrist: NA Name of Therapist: NA  Education Status Is patient currently in school?: No Current Grade:  (n/a) Highest grade of school patient has completed: 12th grade Name of school: NA Contact person: NA  Risk to self with the past 6 months Suicidal Ideation: Yes-Currently Present Has patient been a risk to self within the past 6 months prior to admission? : Yes Suicidal Intent: No-Not Currently/Within Last 6 Months Has patient had any suicidal intent within the past 6 months prior to admission? : Yes Is patient at risk for suicide?: Yes Suicidal Plan?: No-Not Currently/Within Last 6 Months Has patient had any suicidal plan within the past 6 months prior to admission? : No Specify Current Suicidal Plan:  (n/a) Access to Means: No Specify Access to Suicidal Means:  (none reported) What has been your use of drugs/alcohol within the last 12 months?:  (Crack Cocaine ) Previous Attempts/Gestures: Yes How many times?:  (1x) Other Self Harm Risks:  (history of cutting ) Triggers for Past  Attempts: Unpredictable Intentional Self Injurious Behavior: Cutting Comment - Self Injurious Behavior:  (patient cut wrist and walked into traffic 1 month ago ) Family Suicide History: No Recent stressful life event(s): Other (Comment) (no supports, health, and chronic pain ) Persecutory voices/beliefs?: Yes Depression: Yes Depression Symptoms: Tearfulness Substance abuse history and/or treatment for substance abuse?: Yes Suicide prevention information given to non-admitted patients: Not applicable  Risk to Others within the past 6 months Homicidal Ideation: No Does patient have any lifetime risk of violence toward others beyond the six months prior to admission? : No Thoughts of Harm to Others: No Current Homicidal Intent: No Current Homicidal Plan: No Access to Homicidal Means: No Identified Victim:  (n/a) History of harm to others?: No Assessment of Violence: None Noted Violent Behavior Description:  (n/a) Does patient have access to weapons?: No Criminal Charges Pending?: No Does patient have a court date: No Is patient on probation?: No  Psychosis Hallucinations: Auditory, Visual (auditory-voices tell him to harm  self; visual-shadows walkin) Delusions: None noted  Mental Status Report Appearance/Hygiene: In scrubs Eye Contact: Poor Motor Activity: Freedom of movement Speech: Logical/coherent Level of Consciousness: Quiet/awake Mood: Depressed Affect: Other (Comment) (congruent with mood. ) Anxiety Level: None Thought Processes: Coherent, Relevant Judgement: Partial Orientation: Other (Comment) (year, city and state.) Obsessive Compulsive Thoughts/Behaviors: None  Cognitive Functioning Concentration: Normal Memory: Recent Intact, Remote Intact IQ: Average Insight: Fair Impulse Control: Poor Appetite: Poor Weight Loss:  (0) Weight Gain:  (0) Sleep: Decreased Total Hours of Sleep:  (2-3 hrs ) Vegetative Symptoms: None  ADLScreening [facility] Assessment  Services) Patient's cognitive ability adequate to safely complete daily activities?: Yes Patient able to express need for assistance with ADLs?: Yes Independently performs ADLs?: No  Prior Inpatient Therapy Prior Inpatient Therapy: Yes Prior Therapy Dates: 2017 Prior Therapy Facilty/Provider(s): [facility] Reason for Treatment: suicide attempts.   Prior Outpatient Therapy Prior Outpatient Therapy: No Prior Therapy Dates: NA Prior Therapy Facilty/Provider(s): NA Reason for Treatment: NA Does patient have an ACCT team?: No Does patient have Intensive In-House Services?  : No Does patient have [facility] services? : No Does patient have P4CC services?: No  ADL Screening (condition at time of admission) Patient's cognitive ability adequate to safely complete daily activities?: Yes Is the patient deaf or have difficulty hearing?: No Does the patient have difficulty seeing, even when wearing glasses/contacts?: Yes Does the patient have difficulty concentrating, remembering, or making decisions?: Yes Patient able to express need for assistance with ADLs?: Yes Does the patient have difficulty dressing or bathing?: Yes Independently performs ADLs?: No Communication: Independent Dressing (OT): Independent Grooming: Independent Feeding: Independent Bathing: Independent Toileting: Independent In/Out Bed: Independent Walks in Home: Independent Is this a change from baseline?: Pre-admission baseline       Abuse/Neglect Assessment (Assessment to be complete while patient is alone) Physical Abuse: Denies Verbal Abuse: Denies Sexual Abuse: Denies Exploitation of patient/patient's resources: Denies Self-Neglect: Denies Values / Beliefs Cultural Requests During Hospitalization: None Spiritual Requests During Hospitalization: None   Advance Directives (For Healthcare) Does Patient Have a Medical Advance Directive?: No Would patient like information on creating a medical advance  directive?: No - Patient declined Nutrition Screen- [facility] Adult/[facility] Patient's home diet: Regular  Additional Information 1:1 In Past 12 Months?: No CIRT Risk: No Elopement Risk: No Does patient have medical clearance?: Yes     Disposition: Per Waylan Boga, DNP, patient to discharge home with outpatient referrals.  Disposition Initial Assessment Completed for this Encounter: Yes Disposition of Patient:  (Per Waylan Boga, DNP, discharge with outpatient referrals ) Other disposition(s): Other (Comment) (Per Waylan Boga, DNP, discharge with outpatient referrals)  On Site Evaluation by:   Reviewed with Physician:     Waldon [name] 12/19/2016 12:20 PM

## 2016-12-19 NOTE — ED Provider Notes (Signed)
[facility] DEPT Provider Note   CSN: [id number] Arrival date & time: 12/19/16  6599     History   Chief Complaint Chief Complaint  Patient presents with  . Addiction Problem  . Suicidal    HPI Michael [name] is a 66 y.o. male.  HPI  66 year old male with a history of age of fibrillation, coronary disease, cocaine abuse, HIV, and recent pulmonary embolism 1 month ago presents with mental health issues. He is requesting detox from cocaine. He states he is also been suicidal. He states both of these things have been going on for "a while". He is also complaining of an exacerbation of his chronic neck and back pain from a fall when he slipped and fell 3 days ago. He was seen here at that time and had negative x-rays and CT scans. He endorses shortness of breath for 1 month or more along with a cough. He states this has not worsened. The onset was when he was diagnosed with a pulmonary embolus at an outside hospital. He states he intermittently takes the blood thinner, but does not think he takes it correctly. He has not had any new weakness or numbness. He has chronic chest pain in his left chest for 6 months that is not worse than typical. He's not sure if he's taking the plavix or not.  Past Medical History:  Diagnosis Date  . A-fib ([place])   . Asthma    No PFTs, history of childhood asthma  . CAD (coronary artery disease)    a. 06/2013 STEMI/PCI ([facility]): LAD w/ thrombus (treated with BMS), mid 75%, D2 75%; LCX OM2 75%; RCA small, PDA 95%, PLV 95%;  b. 10/2013 Cath/PCI: ISR w/in LAD (Promus DES x 2), borderline OM2 lesion;  c. 01/2014 MV: Intermediate risk, medium-sized distal ant wall infarct w/ very small amt of peri-infarct ischemia. EF 60%.  . Cellulitis 04/2014   left facial  . Chondromalacia of medial femoral condyle    Left knee MRI 04/28/12: Chondromalacia of the medial femoral condyle with slight peripheral degeneration of the meniscocapsular junction of the medial meniscus;  followed by sports medicine  . Collagen vascular disease ([place])   . Crack cocaine use    for 20+ years, has been enrolled in detox programs in the past  . Depression    with history of hospitalization for suicidal ideation  . Diabetes mellitus 2002   Diagnosed in 2002, started insulin in 2012  . Gout   . Gout 04/28/2012  . Headache(784.0)    CT head 08/2011: Periventricular and subcortical white matter hypodensities are most in keeping with chronic microangiopathic change  . HIV infection [facility]) Nov 2012   Followed by [name]  . Hyperlipidemia   . Hypertension   . Pulmonary embolism [facility])     Patient Active Problem List   Diagnosis Date Noted  . Substance induced mood disorder ([place]) 11/23/2016  . Back pain 04/18/2016  . S/P carotid endarterectomy 11/15/2015  . MDD (major depressive disorder), recurrent severe, without psychosis ([place]) 09/09/2015  . Cocaine dependence with cocaine-induced mood disorder ([place])   . Cocaine-induced mood disorder ([place]) 08/14/2015  . Gout 07/10/2015  . Cough 04/26/2015  . Lactic acidosis 03/06/2015  . CKD (chronic kidney disease) stage 3, GFR 30-59 ml/min 03/06/2015  . Normocytic anemia 03/06/2015  . Hypoglycemia   . Encounter for general adult medical examination with abnormal findings 02/09/2015  . Cocaine use disorder, severe, dependence ([place]) 12/13/2014  . Substance or medication-induced depressive disorder  with onset during withdrawal ([place]) 12/13/2014  . Cervicalgia 06/28/2014  . Lumbar radiculopathy, chronic 06/28/2014  . Asthma, chronic 02/03/2014  . 3-vessel CAD 06/24/2013  . ED (erectile dysfunction) of organic origin 07/07/2012  . Hypertension goal BP (blood pressure) < 140/80 04/29/2012  . Chondromalacia of left knee 03/19/2012  . Hyperlipidemia with target LDL less than 100 02/12/2012  . Fibromyalgia 02/12/2012  . HIV (human immunodeficiency virus infection) ([place]) 08/27/2011  . Uncontrolled type 2 diabetes with neuropathy ([place])  10/17/2000    Past Surgical History:  Procedure Laterality Date  . BACK SURGERY     1988  . BOWEL RESECTION    . CARDIAC SURGERY    . CERVICAL SPINE SURGERY     " rods in my neck "  . CORONARY STENT PLACEMENT    . NM MYOCAR PERF WALL MOTION  12/27/2011   normal  . SPINE SURGERY         Home Medications    Prior to Admission medications   Medication Sig Start Date End Date Taking? Authorizing Provider  albuterol (PROAIR HFA) 108 (90 Base) MCG/ACT inhaler Inhale 2 puffs into the lungs every 6 (six) hours as needed for wheezing or shortness of breath. 04/10/16  Yes Norman Herrlich, MD  allopurinol (ZYLOPRIM) 100 MG tablet Take 200 mg by mouth daily.   Yes Historical Provider, MD  amantadine (SYMMETREL) 100 MG capsule Take 100 mg by mouth daily.    Yes Historical Provider, MD  aspirin EC 81 MG tablet Take 81 mg by mouth daily.   Yes Historical Provider, MD  buPROPion (WELLBUTRIN XL) 150 MG 24 hr tablet Take 150 mg by mouth daily.   Yes Historical Provider, MD  clopidogrel (PLAVIX) 75 MG tablet Take 75 mg by mouth daily.   Yes Historical Provider, MD  cyclobenzaprine (FLEXERIL) 10 MG tablet Take 10 mg by mouth 3 (three) times daily as needed for muscle spasms.   Yes Historical Provider, MD  diclofenac sodium (VOLTAREN) 1 % GEL Apply 2 g topically 4 (four) times daily as needed (for pain).   Yes Historical Provider, MD  diltiazem (CARDIZEM CD) 120 MG 24 hr capsule Take 1 capsule (120 mg total) by mouth daily. 04/10/16  Yes Norman Herrlich, MD  DULoxetine (CYMBALTA) 30 MG capsule Take 30 mg by mouth daily. 12/07/16  Yes Historical Provider, MD  emtricitabine-rilpivir-tenofovir DF (COMPLERA) 200-25-300 MG tablet Take 1 tablet by mouth daily. 04/10/16  Yes Norman Herrlich, MD  escitalopram (LEXAPRO) 5 MG tablet Take 1 tablet (5 mg total) by mouth daily. 11/27/16  Yes Patrecia Pour, NP  famotidine (PEPCID) 20 MG tablet Take 20 mg by mouth daily.   Yes Historical Provider, MD  gabapentin (NEURONTIN)  400 MG capsule Take 400 mg by mouth 3 (three) times daily.   Yes Historical Provider, MD  glipiZIDE (GLUCOTROL XL) 5 MG 24 hr tablet Take 5 mg by mouth daily with breakfast.   Yes Historical Provider, MD  insulin aspart (NOVOLOG) 100 UNIT/ML injection Inject 2-4 Units into the skin 3 (three) times daily before meals. Pt uses per sliding scale.   Yes Historical Provider, MD  insulin glargine (LANTUS) 100 unit/mL SOPN Inject 25 Units into the skin at bedtime.   Yes Historical Provider, MD  isosorbide mononitrate (IMDUR) 30 MG 24 hr tablet Take 1 tablet (30 mg total) by mouth daily. 04/10/16  Yes Norman Herrlich, MD  losartan (COZAAR) 50 MG tablet Take 1 tablet (50 mg total) by mouth  daily. 04/10/16  Yes Norman Herrlich, MD  metFORMIN (GLUCOPHAGE) 500 MG tablet Take 500 mg by mouth 2 (two) times daily before a meal.   Yes Historical Provider, MD  metoprolol succinate (TOPROL-XL) 25 MG 24 hr tablet Take 1 tablet (25 mg total) by mouth daily. 04/10/16  Yes Norman Herrlich, MD  mirtazapine (REMERON) 7.5 MG tablet Take 7.5 mg by mouth at bedtime.   Yes Historical Provider, MD  pravastatin (PRAVACHOL) 40 MG tablet Take 40 mg by mouth at bedtime.   Yes Historical Provider, MD  risperiDONE (RISPERDAL) 1 MG tablet Take 1 tablet (1 mg total) by mouth at bedtime. 04/10/16  Yes Norman Herrlich, MD    Family History Family History  Problem Relation Age of Onset  . Diabetes Mother   . Hypertension Mother   . Hyperlipidemia Mother   . Diabetes Father   . Cancer Father   . Hypertension Father   . Diabetes Brother   . Heart disease Brother   . Diabetes Sister   . Colon cancer Neg Hx     Social History Social History  Substance Use Topics  . Smoking status: Never Smoker  . Smokeless tobacco: Never Used  . Alcohol use No     Allergies   Patient has no known allergies.   Review of Systems Review of Systems  Constitutional: Negative for fever.  Respiratory: Positive for cough and shortness of breath.     Cardiovascular: Positive for chest pain. Negative for leg swelling.  Gastrointestinal: Negative for abdominal pain and vomiting.  Musculoskeletal: Positive for back pain (chronic, worse since fall) and neck pain (worse since fall).  Neurological: Negative for weakness and numbness.  All other systems reviewed and are negative.    Physical Exam Updated Vital Signs BP 115/64 (BP Location: Left Arm)   Pulse 95   Temp 98.7 F (37.1 C) (Oral)   Resp 16   SpO2 98%   Physical Exam  Constitutional: He is oriented to person, place, and time. He appears well-developed and well-nourished. No distress.  Intermittent cough  HENT:  Head: Normocephalic and atraumatic.  Right Ear: External ear normal.  Left Ear: External ear normal.  Nose: Nose normal.  Eyes: Right eye exhibits no discharge. Left eye exhibits no discharge.  Neck: Normal range of motion. Neck supple. Muscular tenderness (mild, bilateral) present.  Cardiovascular: Normal rate, regular rhythm and normal heart sounds.   Pulmonary/Chest: Effort normal and breath sounds normal. He exhibits tenderness (mild, left anterior).  Abdominal: Soft. There is no tenderness.  Musculoskeletal: He exhibits no edema.  Neurological: He is alert and oriented to person, place, and time.  5/5 strength in all 4 extremities. Normal gross sensation  Skin: Skin is warm and dry. He is not diaphoretic.  Nursing note and vitals reviewed.    ED Treatments / Results  Labs (all labs ordered are listed, but only abnormal results are displayed) Labs Reviewed  ACETAMINOPHEN LEVEL - Abnormal; Notable for the following:       Result Value   Acetaminophen (Tylenol), Serum <10 (*)    All other components within normal limits  COMPREHENSIVE METABOLIC PANEL - Abnormal; Notable for the following:    Glucose, Bld 324 (*)    BUN 24 (*)    Creatinine, Ser 1.43 (*)    GFR calc non Af Amer 50 (*)    GFR calc Af Amer 57 (*)    All other components within normal  limits  CBC WITH DIFFERENTIAL/PLATELET -  Abnormal; Notable for the following:    WBC 3.1 (*)    RBC 4.21 (*)    Hemoglobin 11.1 (*)    HCT 33.9 (*)    Platelets 146 (*)    All other components within normal limits  RAPID URINE DRUG SCREEN, HOSP PERFORMED - Abnormal; Notable for the following:    Cocaine POSITIVE (*)    All other components within normal limits  CBG MONITORING, ED - Abnormal; Notable for the following:    Glucose-Capillary 320 (*)    All other components within normal limits  ETHANOL  SALICYLATE LEVEL  I-STAT TROPOININ, ED    EKG  EKG Interpretation  Date/Time:  Thursday December 19 2016 08:49:43 EDT Ventricular Rate:  81 PR Interval:    QRS Duration: 77 QT Interval:  368 QTC Calculation: 428 R Axis:   63 Text Interpretation:  Sinus rhythm no acute ST/T changes T wave abnormalities no longer present when compared to Dec 08 2016 Confirmed by Regenia Skeeter MD, Jorgia Manthei [phone]) on 12/19/2016 8:55:46 AM       Radiology Dg Chest 2 View  Result Date: 12/19/2016 CLINICAL DATA:  One month of cough and left-sided chest pain. Nonsmoker. History of hypertension, HIV, diabetes, hyperlipidemia, asthma, coronary artery disease EXAM: CHEST  2 VIEW COMPARISON:  PA and lateral chest x-ray of December 16, 2016 FINDINGS: The lungs are adequately inflated and clear. The heart and pulmonary vascularity are normal. The mediastinum is normal in width. There is no pleural effusion. There is calcification in the wall of the aortic arch. And coronary artery stent graft is present. The observed bony thorax exhibits no acute abnormality. IMPRESSION: There is no pneumonia, CHF, nor other acute cardiopulmonary abnormality. Known coronary artery disease.  Thoracic aortic atherosclerosis. Electronically Signed   By: David  Martinique M.D.   On: 12/19/2016 08:40    Procedures Procedures (including critical care time)  Medications Ordered in ED Medications  Rivaroxaban (XARELTO) tablet 15 mg (15 mg Oral Given  12/19/16 1217)     Initial Impression / Assessment and Plan / ED Course  I have reviewed the triage vital signs and the nursing notes.  Pertinent labs & imaging results that were available during my care of the patient were reviewed by me and considered in my medical decision making (see chart for details).  Clinical Course as of Dec 20 2002  Thu Dec 19, 2016  0814 Will consult TTS after medical clearance  [SG]  1048 Patient's workup is unremarkable besides recurrent hyperglycemia. No acidosis. Creatinine at baseline. Cleared for psych consultation and disposition  [SG]    Clinical Course User Index [SG] Sherwood Gambler, MD    Patient to be dispositioned by psych. Medically clear. No concerning neuro findings. CP appears chronic/likely chest wall. Has not had a stent for 3 years, thus given he has recent PE needs to be on xarelto rather than plavix.  Final Clinical Impressions(s) / ED Diagnoses   Final diagnoses:  Cocaine dependence with cocaine-induced mood disorder [facility])    New Prescriptions Discharge Medication List as of 12/19/2016  1:08 PM       Sherwood Gambler, MD 12/19/16 2005

## 2016-12-19 NOTE — Discharge Instructions (Signed)
For your ongoing behavioral health needs, you are advised to follow up with one of the following providers:       [facility], [place] [address]      [phone]      New patients are seen at their walk-in clinic.  Walk-in hours are Monday - Friday from 8:00 am - 12:00 pm, and from 1:00 pm - 3:00 pm.  Walk-in patients are seen on a first come, first served basis, so try to arrive as early as possible for the best chance of being seen the same day.  There is an initial fee of $22.50.       [facility]      [place], [place] [address]      [phone]

## 2016-12-19 NOTE — ED Notes (Signed)
Bed: XF36 Expected date:  Expected time:  Means of arrival:  Comments: Drug use, depression, SOB, neck pain, hyperglycemic

## 2016-12-19 NOTE — BH Assessment (Signed)
[facility] [place] Assessment Progress Note  Per Waylan Boga, DNP, this pt does not require psychiatric hospitalization at this time.  Pt is to be discharged from [facility] with referral information for [facility] for [facility].  This has been included in pt's discharge instructions.  Pt's nurse, Caren Griffins, has been notified.  Jalene Mullet, [place] Triage Specialist [phone]

## 2016-12-19 NOTE — ED Triage Notes (Signed)
Per EMS, pt comes in today with complaints of depression, substance abuse, and suicidal thoughts. Substance abuse consist of crack cocaine. Pt reports hx of suicide attempts but none recently. Pt was picked up from a gas station on [address] and pt reports usually staying at [facility]. Pt also complains of a cough and pain in the left side of neck related to a fall.

## 2016-12-20 ENCOUNTER — Encounter: Payer: Self-pay | Admitting: Pediatric Intensive Care

## 2016-12-24 ENCOUNTER — Other Ambulatory Visit: Payer: Self-pay | Admitting: Infectious Diseases

## 2016-12-24 ENCOUNTER — Encounter: Payer: Self-pay | Admitting: Pediatric Intensive Care

## 2016-12-26 ENCOUNTER — Encounter (INDEPENDENT_AMBULATORY_CARE_PROVIDER_SITE_OTHER): Payer: Self-pay | Admitting: Physician Assistant

## 2016-12-26 ENCOUNTER — Ambulatory Visit (INDEPENDENT_AMBULATORY_CARE_PROVIDER_SITE_OTHER): Payer: Medicare Other | Admitting: Physician Assistant

## 2016-12-26 VITALS — BP 157/85 | HR 55 | Temp 97.5°F | Ht 66.5 in | Wt 180.6 lb

## 2016-12-26 DIAGNOSIS — I10 Essential (primary) hypertension: Secondary | ICD-10-CM

## 2016-12-26 DIAGNOSIS — M542 Cervicalgia: Secondary | ICD-10-CM

## 2016-12-26 DIAGNOSIS — IMO0002 Reserved for concepts with insufficient information to code with codable children: Secondary | ICD-10-CM

## 2016-12-26 DIAGNOSIS — R739 Hyperglycemia, unspecified: Secondary | ICD-10-CM

## 2016-12-26 DIAGNOSIS — B20 Human immunodeficiency virus [HIV] disease: Secondary | ICD-10-CM

## 2016-12-26 DIAGNOSIS — E1165 Type 2 diabetes mellitus with hyperglycemia: Secondary | ICD-10-CM

## 2016-12-26 DIAGNOSIS — E1140 Type 2 diabetes mellitus with diabetic neuropathy, unspecified: Secondary | ICD-10-CM | POA: Diagnosis not present

## 2016-12-26 DIAGNOSIS — I4891 Unspecified atrial fibrillation: Secondary | ICD-10-CM | POA: Diagnosis not present

## 2016-12-26 DIAGNOSIS — Z9119 Patient's noncompliance with other medical treatment and regimen: Secondary | ICD-10-CM | POA: Diagnosis not present

## 2016-12-26 DIAGNOSIS — F418 Other specified anxiety disorders: Secondary | ICD-10-CM

## 2016-12-26 DIAGNOSIS — I2510 Atherosclerotic heart disease of native coronary artery without angina pectoris: Secondary | ICD-10-CM | POA: Diagnosis not present

## 2016-12-26 DIAGNOSIS — Z91199 Patient's noncompliance with other medical treatment and regimen due to unspecified reason: Secondary | ICD-10-CM

## 2016-12-26 LAB — POCT GLYCOSYLATED HEMOGLOBIN (HGB A1C): Hemoglobin A1C: 10.6

## 2016-12-26 LAB — GLUCOSE, POCT (MANUAL RESULT ENTRY): POC Glucose: 200 mg/dl — AB (ref 70–99)

## 2016-12-26 MED ORDER — FAMOTIDINE 20 MG PO TABS: 20.0000 mg | ORAL_TABLET | Freq: Every day | ORAL | 1 refills | Status: DC

## 2016-12-26 MED ORDER — ASPIRIN EC 81 MG PO TBEC: 81.0000 mg | DELAYED_RELEASE_TABLET | Freq: Every day | ORAL | 1 refills | Status: DC

## 2016-12-26 MED ORDER — HYDROCHLOROTHIAZIDE 12.5 MG PO TABS: 12.5000 mg | ORAL_TABLET | Freq: Every day | ORAL | 3 refills | Status: DC

## 2016-12-26 MED ORDER — DILTIAZEM HCL ER COATED BEADS 120 MG PO CP24: 120.0000 mg | ORAL_CAPSULE | Freq: Every day | ORAL | 0 refills | Status: DC

## 2016-12-26 MED ORDER — "INSULIN SYRINGE-NEEDLE U-100 27G X 5/8"" 1 ML MISC": 1.0000 | Freq: Three times a day (TID) | 6 refills | Status: DC | PRN

## 2016-12-26 MED ORDER — INSULIN GLARGINE 100 UNITS/ML SOLOSTAR PEN: 25.0000 [IU] | PEN_INJECTOR | Freq: Every day | SUBCUTANEOUS | 0 refills | Status: DC

## 2016-12-26 MED ORDER — SERTRALINE HCL 25 MG PO TABS: 25.0000 mg | ORAL_TABLET | Freq: Every day | ORAL | 1 refills | Status: DC

## 2016-12-26 MED ORDER — INSULIN ASPART 100 UNIT/ML ~~LOC~~ SOLN: 2.0000 [IU] | Freq: Three times a day (TID) | SUBCUTANEOUS | 0 refills | Status: DC

## 2016-12-26 MED ORDER — ALBUTEROL SULFATE HFA 108 (90 BASE) MCG/ACT IN AERS: 2.0000 | INHALATION_SPRAY | Freq: Four times a day (QID) | RESPIRATORY_TRACT | 0 refills | Status: DC | PRN

## 2016-12-26 MED ORDER — METFORMIN HCL 500 MG PO TABS: 1000.0000 mg | ORAL_TABLET | Freq: Two times a day (BID) | ORAL | 1 refills | Status: DC

## 2016-12-26 MED ORDER — CLONIDINE HCL 0.1 MG PO TABS
0.1000 mg | ORAL_TABLET | Freq: Once | ORAL | Status: AC
  Administered 2016-12-26: 0.1 mg via ORAL

## 2016-12-26 MED ORDER — ISOSORBIDE MONONITRATE ER 30 MG PO TB24: 30.0000 mg | ORAL_TABLET | Freq: Every day | ORAL | 0 refills | Status: DC

## 2016-12-26 MED ORDER — GABAPENTIN 400 MG PO CAPS
400.0000 mg | ORAL_CAPSULE | Freq: Three times a day (TID) | ORAL | 2 refills | Status: DC
Start: 2016-12-26 — End: 2017-09-22

## 2016-12-26 MED ORDER — INSULIN GLARGINE 100 UNITS/ML SOLOSTAR PEN: 25.0000 [IU] | PEN_INJECTOR | Freq: Every day | SUBCUTANEOUS | 1 refills | Status: DC

## 2016-12-26 MED ORDER — RIVAROXABAN (XARELTO) VTE STARTER PACK (15 & 20 MG): ORAL_TABLET | ORAL | 0 refills | Status: DC

## 2016-12-26 MED ORDER — BLOOD GLUCOSE MONITOR KIT: PACK | 0 refills | Status: DC

## 2016-12-26 MED ORDER — PRAVASTATIN SODIUM 40 MG PO TABS: 40.0000 mg | ORAL_TABLET | Freq: Every day | ORAL | 1 refills | Status: DC

## 2016-12-26 MED ORDER — LOSARTAN POTASSIUM 50 MG PO TABS: 50.0000 mg | ORAL_TABLET | Freq: Every day | ORAL | 0 refills | Status: DC

## 2016-12-26 MED ORDER — EMTRICITAB-RILPIVIR-TENOFOV DF 200-25-300 MG PO TABS: 1.0000 | ORAL_TABLET | Freq: Every day | ORAL | 0 refills | Status: DC

## 2016-12-26 NOTE — Patient Instructions (Addendum)
Please call [name] your HIV specialist at [phone] to continue care and treatment for HIV. Please take medications as directed. We have updated your medication list. Please take only what is prescribed today.   Sliding scale:  If sugar 150-200 take 2 units If sugar 201-251 take 4 units If sugar 251-300 take 6 units If sugar 301-350 take 8 units If sugar 351-400 take 10 units   Paresthesia Paresthesia is an abnormal burning or prickling sensation. This sensation is generally felt in the hands, arms, legs, or feet. However, it may occur in any part of the body. Usually, it is not painful. The feeling may be described as:  Tingling or numbness.  Pins and needles.  Skin crawling.  Buzzing.  Limbs falling asleep.  Itching. Most people experience temporary (transient) paresthesia at some time in their lives. Paresthesia may occur when you breathe too quickly (hyperventilation). It can also occur without any apparent cause. Commonly, paresthesia occurs when pressure is placed on a nerve. The sensation quickly goes away after the pressure is removed. For some people, however, paresthesia is a long-lasting (chronic) condition that is caused by an underlying disorder. If you continue to have paresthesia, you may need further medical evaluation. Follow these instructions at home: Watch your condition for any changes. Taking the following actions may help to lessen any discomfort that you are feeling:  Avoid drinking alcohol.  Try acupuncture or massage to help relieve your symptoms.  Keep all follow-up visits as directed by your health care provider. This is important. Contact a health care provider if:  You continue to have episodes of paresthesia.  Your burning or prickling feeling gets worse when you walk.  You have pain, cramps, or dizziness.  You develop a rash. Get help right away if:  You feel weak.  You have trouble walking or moving.  You have problems with  speech, understanding, or vision.  You feel confused.  You cannot control your bladder or bowel movements.  You have numbness after an injury.  You faint. This information is not intended to replace advice given to you by your health care provider. Make sure you discuss any questions you have with your health care provider. Document Released: 09/13/2002 Document Revised: 02/29/2016 Document Reviewed: 09/19/2014 Elsevier Interactive Patient Education  2017 [employer].

## 2016-12-26 NOTE — Progress Notes (Signed)
Subjective:  Patient ID: Michael [name], male    DOB: [birth year]  Age: 66 y.o. MRN: [id number]  CC:  Multiple complaints   HPI Michael [name] is a 66 y.o. male with a PMH of multiple serious comorbidities including cocaine use. presents to establish care. States that his main complaint is cervicalgia for which he has received two surgeries. Has pain and radiculopathy down to the left shoulder. Not currently seeing orthopedics or physical therapy. Has not been taking medications as directed. May be confused as to what he should take. Would like clarification about his medications. He also feels depressed and anxious. Recently seen at ED with suicidal ideation. Rx'ed Mirtazapine but has not taken it. Denies current suicidal ideation/intent. Currently living at the [facility] and is looking forward to have his own apartment.  Does not know how his sugar is doing. Thinks he has a glucometer and strips but has not used them. Patient denies any symptoms apart from those stated above.      Outpatient Medications Prior to Visit  Medication Sig Dispense Refill  . emtricitabine-rilpivir-tenofovir DF (COMPLERA) 200-25-300 MG tablet Take 1 tablet by mouth daily. 30 tablet 0  . albuterol (PROAIR HFA) 108 (90 Base) MCG/ACT inhaler Inhale 2 puffs into the lungs every 6 (six) hours as needed for wheezing or shortness of breath. 1 Inhaler 0  . DULoxetine (CYMBALTA) 30 MG capsule Take 30 mg by mouth daily.    Marland Kitchen gabapentin (NEURONTIN) 400 MG capsule Take 400 mg by mouth 3 (three) times daily.    Marland Kitchen glipiZIDE (GLUCOTROL XL) 5 MG 24 hr tablet Take 5 mg by mouth daily with breakfast.    . insulin glargine (LANTUS) 100 unit/mL SOPN Inject 25 Units into the skin at bedtime.    . isosorbide mononitrate (IMDUR) 30 MG 24 hr tablet Take 1 tablet (30 mg total) by mouth daily. 30 tablet 0  . metFORMIN (GLUCOPHAGE) 500 MG tablet Take 1,000 mg by mouth 2 (two) times daily before a meal.     . metoprolol succinate  (TOPROL-XL) 25 MG 24 hr tablet Take 1 tablet (25 mg total) by mouth daily. 30 tablet 0  . allopurinol (ZYLOPRIM) 100 MG tablet Take 200 mg by mouth daily.    Marland Kitchen amantadine (SYMMETREL) 100 MG capsule Take 100 mg by mouth daily.     Marland Kitchen aspirin EC 81 MG tablet Take 81 mg by mouth daily.    Marland Kitchen buPROPion (WELLBUTRIN XL) 150 MG 24 hr tablet Take 150 mg by mouth daily.    . clopidogrel (PLAVIX) 75 MG tablet Take 75 mg by mouth daily.    . cyclobenzaprine (FLEXERIL) 10 MG tablet Take 10 mg by mouth 3 (three) times daily as needed for muscle spasms.    . diclofenac sodium (VOLTAREN) 1 % GEL Apply 2 g topically 4 (four) times daily as needed (for pain).    Marland Kitchen diltiazem (CARDIZEM CD) 120 MG 24 hr capsule Take 1 capsule (120 mg total) by mouth daily. (Patient not taking: Reported on 12/26/2016) 30 capsule 0  . escitalopram (LEXAPRO) 5 MG tablet Take 1 tablet (5 mg total) by mouth daily. (Patient not taking: Reported on 12/26/2016) 30 tablet 0  . famotidine (PEPCID) 20 MG tablet Take 20 mg by mouth daily.    . insulin aspart (NOVOLOG) 100 UNIT/ML injection Inject 2-4 Units into the skin 3 (three) times daily before meals. Pt uses per sliding scale.    . losartan (COZAAR) 50 MG tablet Take 1 tablet (  50 mg total) by mouth daily. (Patient not taking: Reported on 12/26/2016) 14 tablet 0  . mirtazapine (REMERON) 7.5 MG tablet Take 7.5 mg by mouth at bedtime.    . pravastatin (PRAVACHOL) 40 MG tablet Take 40 mg by mouth at bedtime.    . risperiDONE (RISPERDAL) 1 MG tablet Take 1 tablet (1 mg total) by mouth at bedtime. (Patient not taking: Reported on 12/26/2016) 30 tablet 0   No facility-administered medications prior to visit.      ROS Review of Systems  Constitutional: Negative for chills, fever and malaise/fatigue.  Eyes: Negative for blurred vision.  Respiratory: Negative for cough and shortness of breath.   Cardiovascular: Negative for chest pain, palpitations, orthopnea, claudication and leg swelling.   Gastrointestinal: Negative for abdominal pain and nausea.  Genitourinary: Negative for dysuria and hematuria.  Musculoskeletal: Positive for neck pain. Negative for joint pain and myalgias.  Skin: Negative for rash.  Neurological: Positive for tingling. Negative for headaches.  Psychiatric/Behavioral: Positive for depression. The patient is nervous/anxious.     Objective:  BP (!) 157/85   Pulse (!) 55   Temp 97.5 F (36.4 C) (Oral)   Ht 5' 6.5" (1.689 m)   Wt 180 lb 9.6 oz (81.9 kg)   SpO2 100%   BMI 28.71 kg/m   BP/Weight 12/26/2016 12/19/2016 9/98/3382  Systolic BP 505 397 673  Diastolic BP 85 64 86  Wt. (Lbs) 180.6 - 174  BMI 28.71 - 27.25  Some encounter information is confidential and restricted. Go to Review Flowsheets activity to see all data.      Physical Exam  Constitutional: He is oriented to person, place, and time.  Well developed, somewhat thin, NAD, polite, using cane  HENT:  Head: Normocephalic and atraumatic.  Eyes: Conjunctivae are normal. No scleral icterus.  Neck: Normal range of motion. Neck supple. No thyromegaly present.  Cardiovascular:  Irregularly irregular  Pulmonary/Chest: Effort normal and breath sounds normal.  Abdominal: Soft. Bowel sounds are normal. There is no tenderness.  Musculoskeletal: He exhibits no edema.  Neurological: He is alert and oriented to person, place, and time. No cranial nerve deficit. Coordination normal.  Skin: Skin is warm and dry. No rash noted. No erythema. No pallor.  Psychiatric: He has a normal mood and affect. His behavior is normal. Thought content normal.  Vitals reviewed.    Assessment & Plan:   1. Cervicalgia - Will need orthopedic evaluation  2. Depression with anxiety - Sertraline 50mg  qday  3. Uncontrolled type 2 diabetes with neuropathy ([name]) - A1c 10.6 - CBG 200 - Comprehensive metabolic panel - CBC With Differential - TSH - Microalbumin/Creatinine Ratio, Urine - Lipid Panel - insulin  glargine (LANTUS) 100 unit/mL SOPN; Inject 0.25 mLs (25 Units total) into the skin at bedtime.  Dispense: 12 mL; Refill: 1  4. Hyperglycemia - HgB A1c - Glucose (CBG)  5. 3-vessel CAD - Control BP. Pt asymptomatic. Abstain from cocaine use.  6. Hypertension goal BP (blood pressure) < 140/80 - cloNIDine (CATAPRES) tablet 0.1 mg; Take 1 tablet (0.1 mg total) by mouth once. - hydrochlorothiazide (HYDRODIURIL) 12.5 MG tablet; Take 1 tablet (12.5 mg total) by mouth daily.  Dispense: 90 tablet; Refill: 3  7. HIV (human immunodeficiency virus infection) ([place]) - Refilled HIV medication  8. Non-compliance   Meds ordered this encounter  Medications  . cloNIDine (CATAPRES) tablet 0.1 mg  . hydrochlorothiazide (HYDRODIURIL) 12.5 MG tablet    Sig: Take 1 tablet (12.5 mg total) by mouth daily.  Dispense:  90 tablet    Refill:  3    Order Specific Question:   Supervising Provider    Answer:   Tresa Garter [id number]  . sertraline (ZOLOFT) 25 MG tablet    Sig: Take 1 tablet (25 mg total) by mouth daily.    Dispense:  90 tablet    Refill:  1    Order Specific Question:   Supervising Provider    Answer:   Tresa Garter [id number]  . pravastatin (PRAVACHOL) 40 MG tablet    Sig: Take 1 tablet (40 mg total) by mouth at bedtime.    Dispense:  90 tablet    Refill:  1    Order Specific Question:   Supervising Provider    Answer:   Tresa Garter [id number]  . metFORMIN (GLUCOPHAGE) 500 MG tablet    Sig: Take 2 tablets (1,000 mg total) by mouth 2 (two) times daily before a meal.    Dispense:  180 tablet    Refill:  1    Order Specific Question:   Supervising Provider    Answer:   Tresa Garter [id number]  . losartan (COZAAR) 50 MG tablet    Sig: Take 1 tablet (50 mg total) by mouth daily.    Dispense:  14 tablet    Refill:  0    Order Specific Question:   Supervising Provider    Answer:   Tresa Garter [id number]  . isosorbide mononitrate (IMDUR) 30 MG 24 hr  tablet    Sig: Take 1 tablet (30 mg total) by mouth daily.    Dispense:  30 tablet    Refill:  0    Order Specific Question:   Supervising Provider    Answer:   Tresa Garter [id number]  . DISCONTD: insulin glargine (LANTUS) 100 unit/mL SOPN    Sig: Inject 0.25 mLs (25 Units total) into the skin at bedtime.    Dispense:  12 mL    Refill:  0    Order Specific Question:   Supervising Provider    Answer:   Tresa Garter [id number]  . insulin aspart (NOVOLOG) 100 UNIT/ML injection    Sig: Inject 2-4 Units into the skin 3 (three) times daily before meals. Pt uses per sliding scale.    Dispense:  10 mL    Refill:  0    Order Specific Question:   Supervising Provider    Answer:   Tresa Garter [id number]  . gabapentin (NEURONTIN) 400 MG capsule    Sig: Take 1 capsule (400 mg total) by mouth 3 (three) times daily.    Dispense:  90 capsule    Refill:  2    Order Specific Question:   Supervising Provider    Answer:   Tresa Garter [id number]  . famotidine (PEPCID) 20 MG tablet    Sig: Take 1 tablet (20 mg total) by mouth daily.    Dispense:  30 tablet    Refill:  1    Order Specific Question:   Supervising Provider    Answer:   Tresa Garter [id number]  . diltiazem (CARDIZEM CD) 120 MG 24 hr capsule    Sig: Take 1 capsule (120 mg total) by mouth daily.    Dispense:  30 capsule    Refill:  0    Order Specific Question:   Supervising Provider    Answer:   Tresa Garter [id number]  . aspirin EC 81 MG tablet  Sig: Take 1 tablet (81 mg total) by mouth daily.    Dispense:  90 tablet    Refill:  1    Order Specific Question:   Supervising Provider    Answer:   Tresa Garter [id number]  . albuterol (PROAIR HFA) 108 (90 Base) MCG/ACT inhaler    Sig: Inhale 2 puffs into the lungs every 6 (six) hours as needed for wheezing or shortness of breath.    Dispense:  1 Inhaler    Refill:  0    Order Specific Question:   Supervising Provider    Answer:    Tresa Garter [id number]  . DISCONTD: insulin glargine (LANTUS) 100 unit/mL SOPN    Sig: Inject 0.25 mLs (25 Units total) into the skin at bedtime.    Dispense:  12 mL    Refill:  0    Order Specific Question:   Supervising Provider    Answer:   Tresa Garter [id number]  . insulin glargine (LANTUS) 100 unit/mL SOPN    Sig: Inject 0.25 mLs (25 Units total) into the skin at bedtime.    Dispense:  12 mL    Refill:  1    Order Specific Question:   Supervising Provider    Answer:   Tresa Garter [id number]    Follow-up: Return in about 4 weeks (around 01/23/2017).   [name] PA

## 2016-12-27 ENCOUNTER — Encounter: Payer: Self-pay | Admitting: Pediatric Intensive Care

## 2016-12-27 ENCOUNTER — Telehealth (INDEPENDENT_AMBULATORY_CARE_PROVIDER_SITE_OTHER): Payer: Self-pay | Admitting: Physician Assistant

## 2016-12-27 DIAGNOSIS — E119 Type 2 diabetes mellitus without complications: Secondary | ICD-10-CM

## 2016-12-27 NOTE — Telephone Encounter (Signed)
[name] RN with [facility] called stated patient needs to be referred to [name] @ [facility] ,  patient has not been seen by [name] Please place referral.

## 2016-12-28 LAB — COMPREHENSIVE METABOLIC PANEL
ALBUMIN: 3.9 g/dL (ref 3.6–4.8)
ALT: 9 IU/L (ref 0–44)
AST: 18 IU/L (ref 0–40)
Albumin/Globulin Ratio: 1.2 (ref 1.2–2.2)
Alkaline Phosphatase: 78 IU/L (ref 39–117)
BUN / CREAT RATIO: 14 (ref 10–24)
BUN: 15 mg/dL (ref 8–27)
Bilirubin Total: <0.2 mg/dL (ref 0.0–1.2)
CALCIUM: 8.8 mg/dL (ref 8.6–10.2)
CO2: 24 mmol/L (ref 18–29)
Chloride: 102 mmol/L (ref 96–106)
Creatinine, Ser: 1.1 mg/dL (ref 0.76–1.27)
GFR calc Af Amer: 80 mL/min/{1.73_m2} (ref >59)
GFR, EST NON AFRICAN AMERICAN: 70 mL/min/{1.73_m2} (ref >59)
Globulin, Total: 3.2 g/dL (ref 1.5–4.5)
Glucose: 197 mg/dL — ABNORMAL HIGH (ref 65–99)
Potassium: 5 mmol/L (ref 3.5–5.2)
Sodium: 140 mmol/L (ref 134–144)
TOTAL PROTEIN: 7.1 g/dL (ref 6.0–8.5)

## 2016-12-28 LAB — TSH: TSH: 3.32 u[IU]/mL (ref 0.450–4.500)

## 2016-12-28 LAB — CBC WITH DIFFERENTIAL
BASOS: 0 %
Basophils Absolute: 0 10*3/uL (ref 0.0–0.2)
EOS (ABSOLUTE): 0.1 10*3/uL (ref 0.0–0.4)
EOS: 2 %
HEMATOCRIT: 34.8 % — AB (ref 37.5–51.0)
HEMOGLOBIN: 11.4 g/dL — AB (ref 13.0–17.7)
IMMATURE GRANULOCYTES: 1 %
Immature Grans (Abs): 0 10*3/uL (ref 0.0–0.1)
LYMPHS ABS: 1.6 10*3/uL (ref 0.7–3.1)
Lymphs: 32 %
MCH: 26.8 pg (ref 26.6–33.0)
MCHC: 32.8 g/dL (ref 31.5–35.7)
MCV: 82 fL (ref 79–97)
MONOCYTES: 5 %
MONOS ABS: 0.2 10*3/uL (ref 0.1–0.9)
NEUTROS ABS: 3 10*3/uL (ref 1.4–7.0)
Neutrophils: 60 %
RBC: 4.25 x10E6/uL (ref 4.14–5.80)
RDW: 16.2 % — AB (ref 12.3–15.4)
WBC: 5 10*3/uL (ref 3.4–10.8)

## 2016-12-28 LAB — LIPID PANEL
CHOLESTEROL TOTAL: 178 mg/dL (ref 100–199)
Chol/HDL Ratio: 3.7 ratio units (ref 0.0–5.0)
HDL: 48 mg/dL (ref >39)
LDL Calculated: 75 mg/dL (ref 0–99)
TRIGLYCERIDES: 277 mg/dL — AB (ref 0–149)
VLDL Cholesterol Cal: 55 mg/dL — ABNORMAL HIGH (ref 5–40)

## 2016-12-28 LAB — MICROALBUMIN / CREATININE URINE RATIO

## 2016-12-30 ENCOUNTER — Other Ambulatory Visit (INDEPENDENT_AMBULATORY_CARE_PROVIDER_SITE_OTHER): Payer: Self-pay | Admitting: Physician Assistant

## 2016-12-30 ENCOUNTER — Telehealth (INDEPENDENT_AMBULATORY_CARE_PROVIDER_SITE_OTHER): Payer: Self-pay | Admitting: Physician Assistant

## 2016-12-30 ENCOUNTER — Other Ambulatory Visit: Payer: Self-pay

## 2016-12-30 DIAGNOSIS — E119 Type 2 diabetes mellitus without complications: Secondary | ICD-10-CM

## 2016-12-30 DIAGNOSIS — Z794 Long term (current) use of insulin: Principal | ICD-10-CM

## 2016-12-30 DIAGNOSIS — B1920 Unspecified viral hepatitis C without hepatic coma: Secondary | ICD-10-CM | POA: Insufficient documentation

## 2016-12-30 MED ORDER — INSULIN LISPRO 100 UNIT/ML ~~LOC~~ SOLN: 5.0000 [IU] | Freq: Three times a day (TID) | SUBCUTANEOUS | 5 refills | Status: DC

## 2016-12-30 NOTE — Progress Notes (Signed)
Pharmacy requests change from Novolog to Humalog.

## 2016-12-30 NOTE — Telephone Encounter (Signed)
[name] RN left voice mail, for patient called stated needs Rx changed for insulin. [employer] will fax information, Please fax back ASAP,  Jordan Hawks can only go tomorrow at 1130am to teach patient on how to use insulin, if you have any question please call her back at [phone]

## 2016-12-30 NOTE — Telephone Encounter (Signed)
Rx changed from Novolog to Humalog

## 2016-12-30 NOTE — Progress Notes (Signed)
Received request by RN at [facility] to have patient referred to [name] at [place].

## 2016-12-31 ENCOUNTER — Encounter: Payer: Self-pay | Admitting: Pediatric Intensive Care

## 2016-12-31 DIAGNOSIS — Z794 Long term (current) use of insulin: Principal | ICD-10-CM

## 2016-12-31 DIAGNOSIS — E119 Type 2 diabetes mellitus without complications: Secondary | ICD-10-CM

## 2016-12-31 LAB — GLUCOSE, POCT (MANUAL RESULT ENTRY): POC Glucose: 233 mg/dl — AB (ref 70–99)

## 2017-01-02 LAB — MICROALBUMIN / CREATININE URINE RATIO
CREATININE, UR: 73.9 mg/dL
Microalb/Creat Ratio: 171.2 mg/g creat — ABNORMAL HIGH (ref 0.0–30.0)
Microalbumin, Urine: 126.5 ug/mL

## 2017-01-02 LAB — SPECIMEN STATUS REPORT

## 2017-01-06 ENCOUNTER — Encounter (HOSPITAL_COMMUNITY): Payer: Self-pay

## 2017-01-06 ENCOUNTER — Emergency Department (HOSPITAL_COMMUNITY)
Admission: EM | Admit: 2017-01-06 | Discharge: 2017-01-06 | Disposition: A | Discharge status: Home / Self Care | Payer: Medicare Other | Attending: Emergency Medicine | Admitting: Emergency Medicine

## 2017-01-06 DIAGNOSIS — I10 Essential (primary) hypertension: Secondary | ICD-10-CM | POA: Insufficient documentation

## 2017-01-06 DIAGNOSIS — Z955 Presence of coronary angioplasty implant and graft: Secondary | ICD-10-CM | POA: Insufficient documentation

## 2017-01-06 DIAGNOSIS — J45909 Unspecified asthma, uncomplicated: Secondary | ICD-10-CM | POA: Diagnosis not present

## 2017-01-06 DIAGNOSIS — L853 Xerosis cutis: Secondary | ICD-10-CM | POA: Diagnosis not present

## 2017-01-06 DIAGNOSIS — Z79899 Other long term (current) drug therapy: Secondary | ICD-10-CM | POA: Diagnosis not present

## 2017-01-06 DIAGNOSIS — I2510 Atherosclerotic heart disease of native coronary artery without angina pectoris: Secondary | ICD-10-CM | POA: Insufficient documentation

## 2017-01-06 DIAGNOSIS — L299 Pruritus, unspecified: Secondary | ICD-10-CM | POA: Diagnosis not present

## 2017-01-06 MED ORDER — HYDROCORTISONE 1 % EX CREA: TOPICAL_CREAM | CUTANEOUS | 0 refills | Status: DC

## 2017-01-06 NOTE — ED Provider Notes (Signed)
[place] DEPT Provider Note   CSN: [id number] Arrival date & time: 01/06/17  1150  By signing my name below, I, Hansel Feinstein, attest that this documentation has been prepared under the direction and in the presence of  Providence Lanius, PA-C. Electronically Signed: Hansel Feinstein, ED Scribe. 01/06/17. 2:38 PM.    History   Chief Complaint Chief Complaint  Patient presents with  . Pruritis    HPI Michael [name] is a 66 y.o. male with extensive PMHx including DM who presents to the Emergency Department complaining of gradually worsening generalized itching to his entire body that began 1.5 months ago. Patient reports that he was unable to sleep last due to generalized itching. He has not tried any OTC medications or creams. He denies any alleviating or aggravating factors. He has not been seen by his PCP for symptoms. He denies any recent antibiotic use, new soaps, lotions or detergents. He denies any rash, fever, chills, difficulty swallowing, oral or throat swelling, CP, SOB.    The history is provided by the patient. No language interpreter was used.    Past Medical History:  Diagnosis Date  . A-fib ([place])   . Asthma    No PFTs, history of childhood asthma  . CAD (coronary artery disease)    a. 06/2013 STEMI/PCI ([facility]): LAD w/ thrombus (treated with BMS), mid 75%, D2 75%; LCX OM2 75%; RCA small, PDA 95%, PLV 95%;  b. 10/2013 Cath/PCI: ISR w/in LAD (Promus DES x 2), borderline OM2 lesion;  c. 01/2014 MV: Intermediate risk, medium-sized distal ant wall infarct w/ very small amt of peri-infarct ischemia. EF 60%.  . Cellulitis 04/2014   left facial  . Chondromalacia of medial femoral condyle    Left knee MRI 04/28/12: Chondromalacia of the medial femoral condyle with slight peripheral degeneration of the meniscocapsular junction of the medial meniscus; followed by sports medicine  . Collagen vascular disease ([place])   . Crack cocaine use    for 20+ years, has been enrolled in detox programs  in the past  . Depression    with history of hospitalization for suicidal ideation  . Diabetes mellitus 2002   Diagnosed in 2002, started insulin in 2012  . Gout   . Gout 04/28/2012  . Headache(784.0)    CT head 08/2011: Periventricular and subcortical white matter hypodensities are most in keeping with chronic microangiopathic change  . HIV infection [facility]) Nov 2012   Followed by [name]  . Hyperlipidemia   . Hypertension   . Pulmonary embolism [facility])     Patient Active Problem List   Diagnosis Date Noted  . Hepatitis C 12/30/2016  . Substance induced mood disorder ([place]) 11/23/2016  . Back pain 04/18/2016  . S/P carotid endarterectomy 11/15/2015  . MDD (major depressive disorder), recurrent severe, without psychosis ([place]) 09/09/2015  . Cocaine dependence with cocaine-induced mood disorder ([place])   . Cocaine-induced mood disorder ([place]) 08/14/2015  . Gout 07/10/2015  . Cough 04/26/2015  . Lactic acidosis 03/06/2015  . CKD (chronic kidney disease) stage 3, GFR 30-59 ml/min 03/06/2015  . Normocytic anemia 03/06/2015  . Hypoglycemia   . Encounter for general adult medical examination with abnormal findings 02/09/2015  . Cocaine use disorder, severe, dependence ([place]) 12/13/2014  . Substance or medication-induced depressive disorder with onset during withdrawal ([place]) 12/13/2014  . Cervicalgia 06/28/2014  . Lumbar radiculopathy, chronic 06/28/2014  . Asthma, chronic 02/03/2014  . 3-vessel CAD 06/24/2013  . ED (erectile dysfunction) of organic origin 07/07/2012  . Hypertension  goal BP (blood pressure) < 140/80 04/29/2012  . Chondromalacia of left knee 03/19/2012  . Hyperlipidemia with target LDL less than 100 02/12/2012  . Fibromyalgia 02/12/2012  . HIV (human immunodeficiency virus infection) ([place]) 08/27/2011  . Uncontrolled type 2 diabetes with neuropathy ([place]) 10/17/2000    Past Surgical History:  Procedure Laterality Date  . BACK SURGERY     1988  . BOWEL RESECTION      . CARDIAC SURGERY    . CERVICAL SPINE SURGERY     " rods in my neck "  . CORONARY STENT PLACEMENT    . NM MYOCAR PERF WALL MOTION  12/27/2011   normal  . SPINE SURGERY         Home Medications    Prior to Admission medications   Medication Sig Start Date End Date Taking? Authorizing Provider  albuterol (PROAIR HFA) 108 (90 Base) MCG/ACT inhaler Inhale 2 puffs into the lungs every 6 (six) hours as needed for wheezing or shortness of breath. 12/26/16   [name], PA-C  blood glucose meter kit and supplies KIT Dispense based on patient and insurance preference. Use up to four times daily as directed. (FOR ICD-9 250.00, 250.01). 12/26/16   [name], PA-C  diltiazem (CARDIZEM CD) 120 MG 24 hr capsule Take 1 capsule (120 mg total) by mouth daily. 12/26/16   Roger Roderic Ovens, PA-C  emtricitabine-rilpivir-tenofovir DF (COMPLERA) 200-25-300 MG tablet Take 1 tablet by mouth daily. 12/26/16   Roger Roderic Ovens, PA-C  famotidine (PEPCID) 20 MG tablet Take 1 tablet (20 mg total) by mouth daily. 12/26/16   Roger Roderic Ovens, PA-C  gabapentin (NEURONTIN) 400 MG capsule Take 1 capsule (400 mg total) by mouth 3 (three) times daily. 12/26/16   Roger Roderic Ovens, PA-C  hydrochlorothiazide (HYDRODIURIL) 12.5 MG tablet Take 1 tablet (12.5 mg total) by mouth daily. 12/26/16   Roger Roderic Ovens, PA-C  hydrocortisone cream 1 % Apply to affected area 2 times daily 01/06/17   Volanda Napoleon, PA-C  insulin glargine (LANTUS) 100 unit/mL SOPN Inject 0.25 mLs (25 Units total) into the skin at bedtime. 12/26/16   Roger Roderic Ovens, PA-C  insulin lispro (HUMALOG) 100 UNIT/ML injection Inject 0.05 mLs (5 Units total) into the skin 3 (three) times daily before meals. Sliding scale as directed. 12/30/16   Roger Roderic Ovens, PA-C  Insulin Syringe-Needle U-100 (B-D INS SYR MICROFINE 1CC/27G) 27G X 5/8" 1 ML MISC 1 strip by Does not apply route 3 (three) times daily as needed. 12/26/16   Roger Roderic Ovens, PA-C   isosorbide mononitrate (IMDUR) 30 MG 24 hr tablet Take 1 tablet (30 mg total) by mouth daily. 12/26/16   Roger Roderic Ovens, PA-C  losartan (COZAAR) 50 MG tablet Take 1 tablet (50 mg total) by mouth daily. 12/26/16   Roger Roderic Ovens, PA-C  metFORMIN (GLUCOPHAGE) 500 MG tablet Take 2 tablets (1,000 mg total) by mouth 2 (two) times daily before a meal. 12/26/16   [name], PA-C  pravastatin (PRAVACHOL) 40 MG tablet Take 1 tablet (40 mg total) by mouth at bedtime. 12/26/16   Roger Roderic Ovens, PA-C  Rivaroxaban 15 & 20 MG TBPK Take as directed on package: Start with one 19m tablet by mouth twice a day with food. On Day 22, switch to one 222mtablet once a day with food. 12/26/16   Roger DaRoderic OvensPA-C  sertraline (ZOLOFT) 25 MG tablet Take 1 tablet (25 mg total) by mouth daily. 12/26/16  [name], PA-C    Family History Family History  Problem Relation Age of Onset  . Diabetes Mother   . Hypertension Mother   . Hyperlipidemia Mother   . Diabetes Father   . Cancer Father   . Hypertension Father   . Diabetes Brother   . Heart disease Brother   . Diabetes Sister   . Colon cancer Neg Hx     Social History Social History  Substance Use Topics  . Smoking status: Never Smoker  . Smokeless tobacco: Never Used  . Alcohol use No     Allergies   Patient has no known allergies.   Review of Systems Review of Systems  Constitutional: Negative for chills and fever.  HENT: Negative for congestion, facial swelling, rhinorrhea and sore throat.   Eyes: Negative for visual disturbance.  Respiratory: Negative for cough and shortness of breath.   Cardiovascular: Negative for chest pain.  Gastrointestinal: Negative for abdominal pain, nausea and vomiting.  Genitourinary: Negative for dysuria and hematuria.  Musculoskeletal: Negative for neck pain.  Skin: Negative for rash.       +Generalized pruritus  Neurological: Negative for numbness and headaches.  All other systems  reviewed and are negative.    Physical Exam Updated Vital Signs BP 120/72   Pulse 82   Temp 98.7 F (37.1 C) (Oral)   Resp 18   SpO2 100%   Physical Exam  Constitutional: He appears well-developed and well-nourished.  HENT:  Head: Normocephalic and atraumatic.  Eyes: Conjunctivae and EOM are normal. Right eye exhibits no discharge. Left eye exhibits no discharge. No scleral icterus.  Cardiovascular: Normal rate and regular rhythm.   Pulmonary/Chest: Effort normal and breath sounds normal. He has no wheezes. He has no rales.  Musculoskeletal: He exhibits no deformity.  Neurological: He is alert.  Skin: Skin is warm and dry. No rash noted.  Generalized dry skin, particularly at bilateral knees, elbows. No rashes noted. No excoriations seen. No evidence of insect bites.   Psychiatric: He has a normal mood and affect. His speech is normal and behavior is normal.  Nursing note and vitals reviewed.    ED Treatments / Results   DIAGNOSTIC STUDIES: Oxygen Saturation is 100% on RA, normal by my interpretation.    COORDINATION OF CARE: 2:35 PM Discussed treatment plan with pt at bedside which includes Benadryl, hydrocortisone cream and pt agreed to plan.    Labs (all labs ordered are listed, but only abnormal results are displayed) Labs Reviewed - No data to display  EKG  EKG Interpretation None       Radiology No results found.  Procedures Procedures (including critical care time)  Medications Ordered in ED Medications - No data to display   Initial Impression / Assessment and Plan / ED Course  I have reviewed the triage vital signs and the nursing notes.  Pertinent labs & imaging results that were available during my care of the patient were reviewed by me and considered in my medical decision making (see chart for details).     66 yo M presents with 1.5 months of generalized itching. Came in today because he had difficulty sleeping. Has not tried any OTC  medications or creams. Reassuring history and physical exam. Low suspicion for scabies or bed bugs given absence of findings on physical. Labs reviewed from 12/26/16 and negative for any hepatic or biliary dysfunction that could indicate pruritus. Symptoms likely a combination of dry skin plus absence of any moisturizing lotion  or cream. Plan to send patient home with Hydrocortisone cream and Benadryl for symptomatic relief. Additionally recommended some OTC moisturizers that could help provide relief. Instructed patient to follow-up with his PCP for re-evaluation. Patient states that he has an appointment with his PCP tomorrow. Return precautions discussed. Patient reports understanding and agreement to plan.   Final Clinical Impressions(s) / ED Diagnoses   Final diagnoses:  Itching    New Prescriptions Discharge Medication List as of 01/06/2017  2:58 PM    START taking these medications   Details  hydrocortisone cream 1 % Apply to affected area 2 times daily, Print        I personally performed the services described in this documentation, which was scribed in my presence. The recorded information has been reviewed and is accurate.     Volanda Napoleon, PA-C 01/07/17 Manson Liu, MD 01/08/17 [phone]

## 2017-01-06 NOTE — Discharge Instructions (Signed)
Follow-up with your primary care. Call in the next 3-4 days to arrange for a follow-up appointment.   Use the hydrocortisone cream as directed.   You can take over the counter benadryl to help with the symptoms. Take it as directed.   Please return to the emergency department for any worsening itching, sores, fevers, chills, or any worsening concerns.

## 2017-01-06 NOTE — ED Triage Notes (Signed)
Pt is here complaining of itching all over his body. Pt does state he recently began taking a new medication but does not know what it is. No redness or rash noted anywhere.

## 2017-01-06 NOTE — ED Notes (Signed)
Papers reviewed with patient and he verbalizes understanding only complains of chronic pain

## 2017-01-07 ENCOUNTER — Ambulatory Visit (INDEPENDENT_AMBULATORY_CARE_PROVIDER_SITE_OTHER): Payer: Self-pay | Admitting: Physician Assistant

## 2017-01-07 ENCOUNTER — Other Ambulatory Visit: Payer: Self-pay

## 2017-01-07 NOTE — Patient Outreach (Signed)
[facility]) Care Management  01/07/2017  Michael [name] [birth year] [id number]  Unsuccessful attempt to reach patient referred for screening from the ED list.  Phone number listed as his home is disconnected.  Alternate number listed is the homeless shelter [place], but RN was unsuccessful at reaching him at that number.  RN will make another attempt this week to reach patient.  [name], RN, MSN [place] [phone] Fax [phone]

## 2017-01-08 ENCOUNTER — Other Ambulatory Visit: Payer: Self-pay

## 2017-01-08 NOTE — Patient Outreach (Signed)
[facility]) Care Management  01/08/2017  Michael [name] 1951-08-18 [id number]  Attempted screening call to patient referred due to ED utilization.  Spoke with [name], RN for [name],  who is working with patient.  Unable to speak with patient this date, but at her request I will contact [place] via email ([email or web address])  re [facility] services for patient.  Plan:  Follow up with [facility] RN via email.  [name], RN, MSN [name] [phone] Fax [phone]

## 2017-01-09 ENCOUNTER — Emergency Department (HOSPITAL_COMMUNITY)
Admission: EM | Admit: 2017-01-09 | Discharge: 2017-01-10 | Disposition: A | Discharge status: Home / Self Care | Payer: Medicare Other | Attending: Emergency Medicine | Admitting: Emergency Medicine

## 2017-01-09 ENCOUNTER — Other Ambulatory Visit: Payer: Self-pay

## 2017-01-09 ENCOUNTER — Encounter (HOSPITAL_COMMUNITY): Payer: Self-pay | Admitting: Emergency Medicine

## 2017-01-09 DIAGNOSIS — Z79899 Other long term (current) drug therapy: Secondary | ICD-10-CM | POA: Diagnosis not present

## 2017-01-09 DIAGNOSIS — E1122 Type 2 diabetes mellitus with diabetic chronic kidney disease: Secondary | ICD-10-CM | POA: Insufficient documentation

## 2017-01-09 DIAGNOSIS — I2510 Atherosclerotic heart disease of native coronary artery without angina pectoris: Secondary | ICD-10-CM | POA: Diagnosis not present

## 2017-01-09 DIAGNOSIS — Z813 Family history of other psychoactive substance abuse and dependence: Secondary | ICD-10-CM | POA: Diagnosis not present

## 2017-01-09 DIAGNOSIS — J45909 Unspecified asthma, uncomplicated: Secondary | ICD-10-CM | POA: Insufficient documentation

## 2017-01-09 DIAGNOSIS — Z794 Long term (current) use of insulin: Secondary | ICD-10-CM | POA: Insufficient documentation

## 2017-01-09 DIAGNOSIS — N183 Chronic kidney disease, stage 3 (moderate): Secondary | ICD-10-CM | POA: Diagnosis not present

## 2017-01-09 DIAGNOSIS — E1165 Type 2 diabetes mellitus with hyperglycemia: Secondary | ICD-10-CM | POA: Diagnosis not present

## 2017-01-09 DIAGNOSIS — F1410 Cocaine abuse, uncomplicated: Secondary | ICD-10-CM

## 2017-01-09 DIAGNOSIS — R45851 Suicidal ideations: Secondary | ICD-10-CM | POA: Diagnosis present

## 2017-01-09 DIAGNOSIS — F1424 Cocaine dependence with cocaine-induced mood disorder: Secondary | ICD-10-CM | POA: Diagnosis not present

## 2017-01-09 DIAGNOSIS — I129 Hypertensive chronic kidney disease with stage 1 through stage 4 chronic kidney disease, or unspecified chronic kidney disease: Secondary | ICD-10-CM | POA: Diagnosis not present

## 2017-01-09 DIAGNOSIS — R739 Hyperglycemia, unspecified: Secondary | ICD-10-CM

## 2017-01-09 DIAGNOSIS — F1420 Cocaine dependence, uncomplicated: Secondary | ICD-10-CM | POA: Diagnosis present

## 2017-01-09 LAB — COMPREHENSIVE METABOLIC PANEL
ALT: 17 U/L (ref 17–63)
ANION GAP: 7 (ref 5–15)
AST: 27 U/L (ref 15–41)
Albumin: 4.1 g/dL (ref 3.5–5.0)
Alkaline Phosphatase: 69 U/L (ref 38–126)
BUN: 22 mg/dL — AB (ref 6–20)
CHLORIDE: 107 mmol/L (ref 101–111)
CO2: 25 mmol/L (ref 22–32)
Calcium: 9.2 mg/dL (ref 8.9–10.3)
Creatinine, Ser: 1.46 mg/dL — ABNORMAL HIGH (ref 0.61–1.24)
GFR, EST AFRICAN AMERICAN: 56 mL/min — AB (ref >60)
GFR, EST NON AFRICAN AMERICAN: 48 mL/min — AB (ref >60)
Glucose, Bld: 343 mg/dL — ABNORMAL HIGH (ref 65–99)
POTASSIUM: 3.7 mmol/L (ref 3.5–5.1)
Sodium: 139 mmol/L (ref 135–145)
Total Bilirubin: 0.7 mg/dL (ref 0.3–1.2)
Total Protein: 7.8 g/dL (ref 6.5–8.1)

## 2017-01-09 LAB — RAPID URINE DRUG SCREEN, HOSP PERFORMED
AMPHETAMINES: NOT DETECTED
BENZODIAZEPINES: NOT DETECTED
Barbiturates: NOT DETECTED
COCAINE: POSITIVE — AB
OPIATES: NOT DETECTED
Tetrahydrocannabinol: NOT DETECTED

## 2017-01-09 LAB — CBG MONITORING, ED
GLUCOSE-CAPILLARY: 221 mg/dL — AB (ref 65–99)
Glucose-Capillary: 194 mg/dL — ABNORMAL HIGH (ref 65–99)

## 2017-01-09 LAB — CBC
HCT: 33.4 % — ABNORMAL LOW (ref 39.0–52.0)
Hemoglobin: 10.9 g/dL — ABNORMAL LOW (ref 13.0–17.0)
MCH: 25.6 pg — ABNORMAL LOW (ref 26.0–34.0)
MCHC: 32.6 g/dL (ref 30.0–36.0)
MCV: 78.4 fL (ref 78.0–100.0)
PLATELETS: 155 10*3/uL (ref 150–400)
RBC: 4.26 MIL/uL (ref 4.22–5.81)
RDW: 14.2 % (ref 11.5–15.5)
WBC: 4.4 10*3/uL (ref 4.0–10.5)

## 2017-01-09 LAB — ACETAMINOPHEN LEVEL

## 2017-01-09 LAB — GLUCOSE, POCT (MANUAL RESULT ENTRY): POC Glucose: 268 mg/dl — AB (ref 70–99)

## 2017-01-09 LAB — SALICYLATE LEVEL

## 2017-01-09 LAB — ETHANOL

## 2017-01-09 MED ORDER — PRAVASTATIN SODIUM 40 MG PO TABS
40.0000 mg | ORAL_TABLET | Freq: Every day | ORAL | Status: DC
  Administered 2017-01-09: 40 mg via ORAL
  Filled 2017-01-09: qty 1

## 2017-01-09 MED ORDER — GLIPIZIDE ER 5 MG PO TB24
5.0000 mg | ORAL_TABLET | Freq: Every day | ORAL | Status: DC
  Administered 2017-01-10: 5 mg via ORAL
  Filled 2017-01-09: qty 1

## 2017-01-09 MED ORDER — RIVAROXABAN 15 MG PO TABS
15.0000 mg | ORAL_TABLET | Freq: Two times a day (BID) | ORAL | Status: DC
  Administered 2017-01-09 – 2017-01-10 (×2): 15 mg via ORAL
  Filled 2017-01-09 (×2): qty 1

## 2017-01-09 MED ORDER — FAMOTIDINE 20 MG PO TABS
20.0000 mg | ORAL_TABLET | Freq: Every day | ORAL | Status: DC
  Administered 2017-01-09 – 2017-01-10 (×2): 20 mg via ORAL
  Filled 2017-01-09 (×2): qty 1

## 2017-01-09 MED ORDER — INSULIN LISPRO 100 UNIT/ML ~~LOC~~ SOLN: 5.0000 [IU] | Freq: Three times a day (TID) | SUBCUTANEOUS | Status: DC

## 2017-01-09 MED ORDER — EMTRICITAB-RILPIVIR-TENOFOV AF 200-25-25 MG PO TABS
1.0000 | ORAL_TABLET | Freq: Every day | ORAL | Status: DC
  Administered 2017-01-10: 1 via ORAL
  Filled 2017-01-09: qty 1

## 2017-01-09 MED ORDER — GABAPENTIN 400 MG PO CAPS
400.0000 mg | ORAL_CAPSULE | Freq: Three times a day (TID) | ORAL | Status: DC
  Administered 2017-01-09 – 2017-01-10 (×3): 400 mg via ORAL
  Filled 2017-01-09 (×3): qty 1

## 2017-01-09 MED ORDER — INSULIN GLARGINE 100 UNIT/ML ~~LOC~~ SOLN
25.0000 [IU] | Freq: Every day | SUBCUTANEOUS | Status: DC
Start: 2017-01-09 — End: 2017-01-10
  Administered 2017-01-09: 25 [IU] via SUBCUTANEOUS
  Filled 2017-01-09: qty 0.25

## 2017-01-09 MED ORDER — METFORMIN HCL 500 MG PO TABS: 1000.0000 mg | ORAL_TABLET | Freq: Two times a day (BID) | ORAL | Status: DC

## 2017-01-09 MED ORDER — SERTRALINE HCL 50 MG PO TABS
25.0000 mg | ORAL_TABLET | Freq: Every day | ORAL | Status: DC
  Administered 2017-01-09: 25 mg via ORAL
  Filled 2017-01-09 (×2): qty 1

## 2017-01-09 MED ORDER — RIVAROXABAN (XARELTO) VTE STARTER PACK (15 & 20 MG): 20.0000 mg | ORAL_TABLET | Freq: Every day | ORAL | Status: DC

## 2017-01-09 MED ORDER — DILTIAZEM HCL ER COATED BEADS 120 MG PO CP24
120.0000 mg | ORAL_CAPSULE | Freq: Every day | ORAL | Status: DC
  Administered 2017-01-09 – 2017-01-10 (×2): 120 mg via ORAL
  Filled 2017-01-09 (×2): qty 1

## 2017-01-09 MED ORDER — HYDROCHLOROTHIAZIDE 12.5 MG PO CAPS
12.5000 mg | ORAL_CAPSULE | Freq: Every day | ORAL | Status: DC
  Administered 2017-01-09 – 2017-01-10 (×2): 12.5 mg via ORAL
  Filled 2017-01-09 (×2): qty 1

## 2017-01-09 MED ORDER — INSULIN ASPART 100 UNIT/ML ~~LOC~~ SOLN
5.0000 [IU] | Freq: Three times a day (TID) | SUBCUTANEOUS | Status: DC
  Administered 2017-01-09 – 2017-01-10 (×2): 5 [IU] via SUBCUTANEOUS
  Filled 2017-01-09 (×2): qty 1

## 2017-01-09 MED ORDER — RIVAROXABAN 20 MG PO TABS: 20.0000 mg | ORAL_TABLET | Freq: Every day | ORAL | Status: DC

## 2017-01-09 MED ORDER — ISOSORBIDE MONONITRATE ER 30 MG PO TB24
30.0000 mg | ORAL_TABLET | Freq: Every day | ORAL | Status: DC
  Administered 2017-01-09 – 2017-01-10 (×2): 30 mg via ORAL
  Filled 2017-01-09 (×2): qty 1

## 2017-01-09 MED ORDER — DULOXETINE HCL 30 MG PO CPEP
30.0000 mg | ORAL_CAPSULE | Freq: Every day | ORAL | Status: DC
  Administered 2017-01-09 – 2017-01-10 (×2): 30 mg via ORAL
  Filled 2017-01-09 (×2): qty 1

## 2017-01-09 MED ORDER — METFORMIN HCL 500 MG PO TABS
500.0000 mg | ORAL_TABLET | Freq: Two times a day (BID) | ORAL | Status: DC
  Administered 2017-01-09 – 2017-01-10 (×2): 500 mg via ORAL
  Filled 2017-01-09 (×2): qty 1

## 2017-01-09 MED ORDER — ALBUTEROL SULFATE HFA 108 (90 BASE) MCG/ACT IN AERS: 2.0000 | INHALATION_SPRAY | Freq: Four times a day (QID) | RESPIRATORY_TRACT | Status: DC | PRN

## 2017-01-09 MED ORDER — HYDROCHLOROTHIAZIDE 25 MG PO TABS
12.5000 mg | ORAL_TABLET | Freq: Every day | ORAL | Status: DC
  Filled 2017-01-09: qty 0.5

## 2017-01-09 MED ORDER — LOSARTAN POTASSIUM 50 MG PO TABS
50.0000 mg | ORAL_TABLET | Freq: Every day | ORAL | Status: DC
  Administered 2017-01-09 – 2017-01-10 (×2): 50 mg via ORAL
  Filled 2017-01-09 (×2): qty 1

## 2017-01-09 NOTE — ED Notes (Signed)
Bed: WLPT4 Expected date:  Expected time:  Means of arrival:  Comments: 

## 2017-01-09 NOTE — Patient Outreach (Addendum)
[place] [facility]) Care Management  01/09/2017  Michael [name] Oct 20, 1950 [id number]  RN notified through [facility] that patient went to the ED this morning and has been admitted due to suicidal ideation.  RN also communicated via email with [facility] RN [name] to explain [facility] services available to [name] after discharge.  Plan: Case closure.          Notify CMA of case closure.  [name], RN, MSN [place] [phone] Fax [phone]

## 2017-01-09 NOTE — Congregational Nurse Program (Signed)
Congregational Nurse Program Note  Date of Encounter: 12/27/2016  Past Medical History: Past Medical History:  Diagnosis Date  . A-fib ([place])   . Asthma    No PFTs, history of childhood asthma  . CAD (coronary artery disease)    a. 06/2013 STEMI/PCI ([facility]): LAD w/ thrombus (treated with BMS), mid 75%, D2 75%; LCX OM2 75%; RCA small, PDA 95%, PLV 95%;  b. 10/2013 Cath/PCI: ISR w/in LAD (Promus DES x 2), borderline OM2 lesion;  c. 01/2014 MV: Intermediate risk, medium-sized distal ant wall infarct w/ very small amt of peri-infarct ischemia. EF 60%.  . Cellulitis 04/2014   left facial  . Chondromalacia of medial femoral condyle    Left knee MRI 04/28/12: Chondromalacia of the medial femoral condyle with slight peripheral degeneration of the meniscocapsular junction of the medial meniscus; followed by sports medicine  . Collagen vascular disease ([place])   . Crack cocaine use    for 20+ years, has been enrolled in detox programs in the past  . Depression    with history of hospitalization for suicidal ideation  . Diabetes mellitus 2002   Diagnosed in 2002, started insulin in 2012  . Gout   . Gout 04/28/2012  . Headache(784.0)    CT head 08/2011: Periventricular and subcortical white matter hypodensities are most in keeping with chronic microangiopathic change  . HIV infection [facility]) Nov 2012   Followed by [name]  . Hyperlipidemia   . Hypertension   . Pulmonary embolism [facility])     Encounter Details:     CNP Questionnaire - 12/27/16 1030      Patient Demographics   Is this a new or existing patient? Existing   Patient is considered a/an Not Applicable   Race African-American/Black     Patient Assistance   Location of Patient Assistance [facility]   Patient's financial/insurance status Medicare;Low Income   Uninsured Patient ([facility]) No   Patient referred to apply for the following financial assistance Not Applicable   Food insecurities addressed Not Applicable   Transportation assistance No   Type of Assistance Other   Assistance securing medications Yes   Type of Assistance Other   Educational health offerings Navigating the healthcare system;Medications;Diabetes     Encounter Details   Primary purpose of visit Education/Health Concerns;Post PCP Visit   Was an Emergency Department visit averted? Not Applicable   Does patient have a medical provider? Yes   Patient referred to Follow up with established PCP   Was a mental health screening completed? (GAINS tool) No   Does patient have dental issues? No   Does patient have vision issues? Yes   Was a vision referral made? No   Does your patient have an abnormal blood pressure today? No   Since previous encounter, have you referred patient for abnormal blood pressure that resulted in a new diagnosis or medication change? No   Does your patient have an abnormal blood glucose today? Yes   Since previous encounter, have you referred patient for abnormal blood glucose that resulted in a new diagnosis or medication change? Yes   Was there a life-saving intervention made? No         Clinical Intake - 12/26/16 1038      Pre-visit preparation   Pre-visit preparation completed Yes     Abuse/Neglect   Do you feel unsafe in your current relationship? No   Do you feel physically threatened by others? No   Anyone hurting you at home, work,  or school? No   Unable to ask? No     Patient Literacy   How often do you need to have someone help you when you read instructions, pamphlets, or other written materials from your doctor or pharmacy? 1 - Never   What is the last grade level you completed in school? 12th     Client has completed PCP appointment and new medications were ordered. Client will now be on sliding scale insulin. Sliding scale reviewed but no insulin was available in real time as it was not delivered from pharmacy. Pharmacy needed prior authorization and CN contacted clinic to notify.  Pharmacy will delivery insulin next week so client can return demo. CN called [facility] to make appointment with Dr [name] but client needs referral from PCP. Clinic aware.

## 2017-01-09 NOTE — ED Triage Notes (Signed)
patient states that he was living in [place] but didn't like it so came to [place]. Patient staying at [facility]. Patient admits to having real bad problem with crack./cocaine and chronic back pain. Patient states that he has dealt with SI/HI thoughts for long time now (years). Patient states that his plan to harm himself is by using large amounts of drugs. Patient states that he wants to hurt others but denies anyone specific or any plan.

## 2017-01-09 NOTE — ED Provider Notes (Addendum)
[place] DEPT Provider Note   CSN: [id number] Arrival date & time: 01/09/17  1048     History   Chief Complaint Chief Complaint  Patient presents with  . Suicidal  . Homicidal    HPI Michael [name] is a 66 y.o. male.  Patient expresses suicidal and homicidal ideation. He is addicted to crack cocaine. Patient resides at the [facility]. Past medical history of depression and substance abuse. No evidence of psychosis. See nursing notes.      Past Medical History:  Diagnosis Date  . A-fib ([place])   . Asthma    No PFTs, history of childhood asthma  . CAD (coronary artery disease)    a. 06/2013 STEMI/PCI ([facility]): LAD w/ thrombus (treated with BMS), mid 75%, D2 75%; LCX OM2 75%; RCA small, PDA 95%, PLV 95%;  b. 10/2013 Cath/PCI: ISR w/in LAD (Promus DES x 2), borderline OM2 lesion;  c. 01/2014 MV: Intermediate risk, medium-sized distal ant wall infarct w/ very small amt of peri-infarct ischemia. EF 60%.  . Cellulitis 04/2014   left facial  . Chondromalacia of medial femoral condyle    Left knee MRI 04/28/12: Chondromalacia of the medial femoral condyle with slight peripheral degeneration of the meniscocapsular junction of the medial meniscus; followed by sports medicine  . Collagen vascular disease ([place])   . Crack cocaine use    for 20+ years, has been enrolled in detox programs in the past  . Depression    with history of hospitalization for suicidal ideation  . Diabetes mellitus 2002   Diagnosed in 2002, started insulin in 2012  . Gout   . Gout 04/28/2012  . Headache(784.0)    CT head 08/2011: Periventricular and subcortical white matter hypodensities are most in keeping with chronic microangiopathic change  . HIV infection [name]) Nov 2012   Followed by [name]  . Hyperlipidemia   . Hypertension   . Pulmonary embolism [facility])     Patient Active Problem List   Diagnosis Date Noted  . Hepatitis C 12/30/2016  . Substance induced mood disorder ([place]) 11/23/2016  . Back  pain 04/18/2016  . S/P carotid endarterectomy 11/15/2015  . MDD (major depressive disorder), recurrent severe, without psychosis ([place]) 09/09/2015  . Cocaine dependence with cocaine-induced mood disorder ([place])   . Cocaine-induced mood disorder ([place]) 08/14/2015  . Gout 07/10/2015  . Cough 04/26/2015  . Lactic acidosis 03/06/2015  . CKD (chronic kidney disease) stage 3, GFR 30-59 ml/min 03/06/2015  . Normocytic anemia 03/06/2015  . Hypoglycemia   . Encounter for general adult medical examination with abnormal findings 02/09/2015  . Cocaine use disorder, severe, dependence ([place]) 12/13/2014  . Substance or medication-induced depressive disorder with onset during withdrawal ([place]) 12/13/2014  . Cervicalgia 06/28/2014  . Lumbar radiculopathy, chronic 06/28/2014  . Asthma, chronic 02/03/2014  . 3-vessel CAD 06/24/2013  . ED (erectile dysfunction) of organic origin 07/07/2012  . Hypertension goal BP (blood pressure) < 140/80 04/29/2012  . Chondromalacia of left knee 03/19/2012  . Hyperlipidemia with target LDL less than 100 02/12/2012  . Fibromyalgia 02/12/2012  . HIV (human immunodeficiency virus infection) ([place]) 08/27/2011  . Uncontrolled type 2 diabetes with neuropathy ([place]) 10/17/2000    Past Surgical History:  Procedure Laterality Date  . BACK SURGERY     1988  . BOWEL RESECTION    . CARDIAC SURGERY    . CERVICAL SPINE SURGERY     " rods in my neck "  . CORONARY STENT PLACEMENT    .  NM MYOCAR PERF WALL MOTION  12/27/2011   normal  . SPINE SURGERY         Home Medications    Prior to Admission medications   Medication Sig Start Date End Date Taking? Authorizing Provider  albuterol (PROAIR HFA) 108 (90 Base) MCG/ACT inhaler Inhale 2 puffs into the lungs every 6 (six) hours as needed for wheezing or shortness of breath. 12/26/16  Yes [name], PA-C  blood glucose meter kit and supplies KIT Dispense based on patient and insurance preference. Use up to four times daily  as directed. (FOR ICD-9 250.00, 250.01). 12/26/16  Yes Roger Roderic Ovens, PA-C  diltiazem (CARDIZEM CD) 120 MG 24 hr capsule Take 1 capsule (120 mg total) by mouth daily. 12/26/16  Yes Roger Roderic Ovens, PA-C  DULoxetine (CYMBALTA) 30 MG capsule Take 30 mg by mouth daily. 12/07/16  Yes Historical Provider, MD  emtricitabine-rilpivir-tenofovir DF (COMPLERA) 200-25-300 MG tablet Take 1 tablet by mouth daily. 12/26/16  Yes Roger Roderic Ovens, PA-C  famotidine (PEPCID) 20 MG tablet Take 1 tablet (20 mg total) by mouth daily. 12/26/16  Yes Roger Roderic Ovens, PA-C  gabapentin (NEURONTIN) 400 MG capsule Take 1 capsule (400 mg total) by mouth 3 (three) times daily. 12/26/16  Yes Roger Roderic Ovens, PA-C  glipiZIDE (GLUCOTROL XL) 5 MG 24 hr tablet Take 5 mg by mouth daily. 12/07/16  Yes Historical Provider, MD  hydrochlorothiazide (HYDRODIURIL) 12.5 MG tablet Take 1 tablet (12.5 mg total) by mouth daily. 12/26/16  Yes Roger Roderic Ovens, PA-C  insulin glargine (LANTUS) 100 unit/mL SOPN Inject 0.25 mLs (25 Units total) into the skin at bedtime. 12/26/16  Yes Roger Roderic Ovens, PA-C  insulin lispro (HUMALOG) 100 UNIT/ML injection Inject 0.05 mLs (5 Units total) into the skin 3 (three) times daily before meals. Sliding scale as directed. 12/30/16  Yes Roger Roderic Ovens, PA-C  Insulin Syringe-Needle U-100 (B-D INS SYR MICROFINE 1CC/27G) 27G X 5/8" 1 ML MISC 1 strip by Does not apply route 3 (three) times daily as needed. 12/26/16  Yes Roger Roderic Ovens, PA-C  isosorbide mononitrate (IMDUR) 30 MG 24 hr tablet Take 1 tablet (30 mg total) by mouth daily. 12/26/16  Yes Roger Roderic Ovens, PA-C  losartan (COZAAR) 50 MG tablet Take 1 tablet (50 mg total) by mouth daily. 12/26/16  Yes Roger Roderic Ovens, PA-C  metFORMIN (GLUCOPHAGE) 500 MG tablet Take 2 tablets (1,000 mg total) by mouth 2 (two) times daily before a meal. 12/26/16  Yes [name], PA-C  pravastatin (PRAVACHOL) 40 MG tablet Take 1 tablet (40 mg total) by mouth at  bedtime. 12/26/16  Yes Roger Roderic Ovens, PA-C  Rivaroxaban 15 & 20 MG TBPK Take as directed on package: Start with one 39m tablet by mouth twice a day with food. On Day 22, switch to one 281mtablet once a day with food. 12/26/16  Yes Roger DaRoderic OvensPA-C  sertraline (ZOLOFT) 25 MG tablet Take 1 tablet (25 mg total) by mouth daily. 12/26/16  Yes Roger DaRoderic OvensPA-C  hydrocortisone cream 1 % Apply to affected area 2 times daily Patient not taking: Reported on 01/09/2017 01/06/17   LiVolanda NapoleonPA-C    Family History Family History  Problem Relation Age of Onset  . Diabetes Mother   . Hypertension Mother   . Hyperlipidemia Mother   . Diabetes Father   . Cancer Father   . Hypertension Father   . Diabetes Brother   . Heart disease Brother   .  Diabetes Sister   . Colon cancer Neg Hx     Social History Social History  Substance Use Topics  . Smoking status: Never Smoker  . Smokeless tobacco: Never Used  . Alcohol use No     Allergies   Patient has no known allergies.   Review of Systems Review of Systems  All other systems reviewed and are negative.    Physical Exam Updated Vital Signs BP 134/85 (BP Location: Left Arm)   Pulse 89   Temp 97.8 F (36.6 C) (Oral)   Resp 18   SpO2 97%   Physical Exam  Constitutional: He is oriented to person, place, and time. He appears well-developed and well-nourished.  HENT:  Head: Normocephalic and atraumatic.  Eyes: Conjunctivae are normal.  Neck: Neck supple.  Cardiovascular: Normal rate and regular rhythm.   Pulmonary/Chest: Effort normal and breath sounds normal.  Abdominal: Soft. Bowel sounds are normal.  Musculoskeletal: Normal range of motion.  Neurological: He is alert and oriented to person, place, and time.  Skin: Skin is warm and dry.  Psychiatric:  Flat affect, depressed  Nursing note and vitals reviewed.    ED Treatments / Results  Labs (all labs ordered are listed, but only abnormal results are  displayed) Labs Reviewed  COMPREHENSIVE METABOLIC PANEL - Abnormal; Notable for the following:       Result Value   Glucose, Bld 343 (*)    BUN 22 (*)    Creatinine, Ser 1.46 (*)    GFR calc non Af Amer 48 (*)    GFR calc Af Amer 56 (*)    All other components within normal limits  ACETAMINOPHEN LEVEL - Abnormal; Notable for the following:    Acetaminophen (Tylenol), Serum <10 (*)    All other components within normal limits  CBC - Abnormal; Notable for the following:    Hemoglobin 10.9 (*)    HCT 33.4 (*)    MCH 25.6 (*)    All other components within normal limits  RAPID URINE DRUG SCREEN, HOSP PERFORMED - Abnormal; Notable for the following:    Cocaine POSITIVE (*)    All other components within normal limits  ETHANOL  SALICYLATE LEVEL    EKG  EKG Interpretation None       Radiology No results found.  Procedures Procedures (including critical care time)  Medications Ordered in ED Medications - No data to display   Initial Impression / Assessment and Plan / ED Course  I have reviewed the triage vital signs and the nursing notes.  Pertinent labs & imaging results that were available during my care of the patient were reviewed by me and considered in my medical decision making (see chart for details).     Patient is depressed and has suicidal or homicidal ideation. Will consult behavioral health.  Final Clinical Impressions(s) / ED Diagnoses   Final diagnoses:  Suicidal ideation  Cocaine abuse    New Prescriptions New Prescriptions   No medications on file     [name], MD 01/09/17 [place], MD 01/09/17 1428

## 2017-01-09 NOTE — Congregational Nurse Program (Signed)
Congregational Nurse Program Note  Date of Encounter: 12/31/2016  Past Medical History: Past Medical History:  Diagnosis Date  . A-fib ([place])   . Asthma    No PFTs, history of childhood asthma  . CAD (coronary artery disease)    a. 06/2013 [facility]PCI ([facility]): LAD w/ thrombus (treated with BMS), mid 75%, D2 75%; LCX OM2 75%; RCA small, PDA 95%, PLV 95%;  b. 10/2013 Cath/PCI: ISR w/in LAD (Promus DES x 2), borderline OM2 lesion;  c. 01/2014 MV: Intermediate risk, medium-sized distal ant wall infarct w/ very small amt of peri-infarct ischemia. EF 60%.  . Cellulitis 04/2014   left facial  . Chondromalacia of medial femoral condyle    Left knee MRI 04/28/12: Chondromalacia of the medial femoral condyle with slight peripheral degeneration of the meniscocapsular junction of the medial meniscus; followed by sports medicine  . Collagen vascular disease ([place])   . Crack cocaine use    for 20+ years, has been enrolled in detox programs in the past  . Depression    with history of hospitalization for suicidal ideation  . Diabetes mellitus 2002   Diagnosed in 2002, started insulin in 2012  . Gout   . Gout 04/28/2012  . Headache(784.0)    CT head 08/2011: Periventricular and subcortical white matter hypodensities are most in keeping with chronic microangiopathic change  . HIV infection [facility]) Nov 2012   Followed by [name]  . Hyperlipidemia   . Hypertension   . Pulmonary embolism [facility])     Encounter Details:     CNP Questionnaire - 12/31/16 0900      Patient Demographics   Is this a new or existing patient? Existing   Patient is considered a/an Not Applicable   Race African-American/Black     Patient Assistance   Location of Patient Assistance [place]   Patient's financial/insurance status Medicare;Low Income   Uninsured Patient ([place]) No   Patient referred to apply for the following financial assistance Not Applicable   Food insecurities addressed Not Applicable   Transportation assistance No   Type of Assistance Other   Assistance securing medications Yes   Type of Assistance Other   Educational health offerings Medications     Encounter Details   Primary purpose of visit Post PCP Visit;Education/Health Concerns   Was an Emergency Department visit averted? Not Applicable   Does patient have a medical provider? Yes   Patient referred to Follow up with established PCP   Was a mental health screening completed? (GAINS tool) No   Does patient have dental issues? No   Does patient have vision issues? Yes   Was a vision referral made? No   Does your patient have an abnormal blood pressure today? No   Since previous encounter, have you referred patient for abnormal blood pressure that resulted in a new diagnosis or medication change? No   Does your patient have an abnormal blood glucose today? Yes   Since previous encounter, have you referred patient for abnormal blood glucose that resulted in a new diagnosis or medication change? No   Was there a life-saving intervention made? No         Clinical Intake - 12/26/16 1038      Pre-visit preparation   Pre-visit preparation completed Yes     Abuse/Neglect   Do you feel unsafe in your current relationship? No   Do you feel physically threatened by others? No   Anyone hurting you at home, work, or school? No  Unable to ask? No     Patient Literacy   How often do you need to have someone help you when you read instructions, pamphlets, or other written materials from your doctor or pharmacy? 1 - Never   What is the last grade level you completed in school? 12th     Client's Humulog insulin available. Reviewed sliding scale again with client. AC BG was 233. Client correctly identified 4 units of s/s, drew up insulin and administered.Reviewed Zarelto prescription with client. Client will follow up with CN in clinic on 4/2. Client requests podiatry appointment.

## 2017-01-09 NOTE — ED Notes (Signed)
Pending review for possible placement with [facility] on 01/10/17.

## 2017-01-09 NOTE — ED Provider Notes (Signed)
Nurses have requested the patient's home medications be ordered. I have confirmed with the patient that he does take glipizide, metformin, Lantus and Humalog. He confirms that he is taking Xarelto 20 mg daily. Medications will be reconfirmed by pharmacy tech as correct.   Charlesetta Shanks, MD 01/09/17 [phone]

## 2017-01-09 NOTE — BH Assessment (Addendum)
Assessment Note  Michael [name] is an 66 y.o. male. He presents to [facility], voluntarily. He has a complaint of depression, suicidal ideations, homicidal ideations, and substance use. Patient has suicidal thoughts. Onset of suicidal thoughts started "yrs ago".  He feels suicidal because of his health issues and lack of support system. He is currently residing at the [name]. He has a history of prior suicide attempts consisting of walking out In traffic. He has also self mutilated himself in the past by cutting. The last attempt of cutting himself was a month ago. He does not recall the trigger for his previous attempt.Today patient has depressive symptoms including homelessness, loss of interest in usual pleasures, and fatigue. No family history of mental illness. He reports homicidal thoughts. He has no homicidal plan or victim in mind. Patient was unable to explain a trigger for his homicidal thoughts. No access to means to harm others. No history of violent behaviors. Denies legal issues. He has auditory hallucinations that tell him to hurt himself. He also reports visual hallucinations of shadows walking across the room. He denies alcohol use. He uses cocaine 2-3 x's per month. He spends $200 per use.  Last use was today. He has received treatment at [facility] yrs ago. He does not have a current outpatient therapist or psychiatrist. No history of physical, sexual, and/or emotional abuse. No support system reported. Patient recently presented to [facility] with similar complaints as noted above. Patient was discharged home and given instructions to follow up with [facility] and [facility]. Patient sts he followed up with neither because loss his paperwork with discharge instructions.   Diagnosis: Major Depressive Disorder, Recurrent, Severe, with psychotic features; Cocaine Abuse  Past Medical History:  Past Medical History:  Diagnosis Date  . A-fib ([place])   . Asthma    No PFTs,  history of childhood asthma  . CAD (coronary artery disease)    a. 06/2013 STEMI/PCI ([facility]): LAD w/ thrombus (treated with BMS), mid 75%, D2 75%; LCX OM2 75%; RCA small, PDA 95%, PLV 95%;  b. 10/2013 Cath/PCI: ISR w/in LAD (Promus DES x 2), borderline OM2 lesion;  c. 01/2014 MV: Intermediate risk, medium-sized distal ant wall infarct w/ very small amt of peri-infarct ischemia. EF 60%.  . Cellulitis 04/2014   left facial  . Chondromalacia of medial femoral condyle    Left knee MRI 04/28/12: Chondromalacia of the medial femoral condyle with slight peripheral degeneration of the meniscocapsular junction of the medial meniscus; followed by sports medicine  . Collagen vascular disease ([place])   . Crack cocaine use    for 20+ years, has been enrolled in detox programs in the past  . Depression    with history of hospitalization for suicidal ideation  . Diabetes mellitus 2002   Diagnosed in 2002, started insulin in 2012  . Gout   . Gout 04/28/2012  . Headache(784.0)    CT head 08/2011: Periventricular and subcortical white matter hypodensities are most in keeping with chronic microangiopathic change  . HIV infection [facility]) Nov 2012   Followed by [name]  . Hyperlipidemia   . Hypertension   . Pulmonary embolism [facility])     Past Surgical History:  Procedure Laterality Date  . BACK SURGERY     1988  . BOWEL RESECTION    . CARDIAC SURGERY    . CERVICAL SPINE SURGERY     " rods in my neck "  . CORONARY STENT PLACEMENT    .  NM MYOCAR PERF WALL MOTION  12/27/2011   normal  . SPINE SURGERY      Family History:  Family History  Problem Relation Age of Onset  . Diabetes Mother   . Hypertension Mother   . Hyperlipidemia Mother   . Diabetes Father   . Cancer Father   . Hypertension Father   . Diabetes Brother   . Heart disease Brother   . Diabetes Sister   . Colon cancer Neg Hx     Social History:  reports that he has never smoked. He has never used smokeless tobacco. He reports that he  uses drugs, including "Crack" cocaine and Cocaine. He reports that he does not drink alcohol.  Additional Social History:  Alcohol / Drug Use Pain Medications: See MAR Prescriptions: See MAR  Over the Counter: See MAR History of alcohol / drug use?: Yes Longest period of sobriety (when/how long): 3 yrs Negative Consequences of Use: Financial, Personal relationships Substance #1 Name of Substance 1: Crack Cocaine 1 - Age of First Use: 66 yrs old  1 - Amount (size/oz): Pt reported, smoking $200-300 worth of crack today.  1 - Frequency: 2-3 day binges  1 - Duration: on-going  1 - Last Use / Amount: Pt reported, today. $300-$400 worth  CIWA: CIWA-Ar BP: 134/85 Pulse Rate: 89 COWS:    Allergies: No Known Allergies  Home Medications:  (Not in a hospital admission)  OB/GYN Status:  No LMP for male patient.  General Assessment Data [facility] of Assessment: [facility] TTS Assessment: In system Is this a Tele or Face-to-Face Assessment?: Face-to-Face Is this an Initial Assessment or a Re-assessment for this encounter?: Initial Assessment Marital status: Divorced [place] name:  (n/a) Is patient pregnant?: No Pregnancy Status: No Living Arrangements: Alone Can pt return to current living arrangement?: Yes Admission Status: Voluntary Is patient capable of signing voluntary admission?: Yes Referral Source: Self/Family/Friend Insurance type:  Secretary/administrator )     Crisis Care Plan Living Arrangements: Alone Legal Guardian: Other: (no legal guardian ) Name of Psychiatrist: NA Name of Therapist: NA  Education Status Is patient currently in school?: No Current Grade:  (n/a) Highest grade of school patient has completed: 12th grade Name of school: NA Contact person: NA  Risk to self with the past 6 months Suicidal Ideation: Yes-Currently Present Has patient been a risk to self within the past 6 months prior to admission? : Yes Suicidal Intent: Yes-Currently Present Has  patient had any suicidal intent within the past 6 months prior to admission? : Yes Is patient at risk for suicide?: Yes Suicidal Plan?: No Has patient had any suicidal plan within the past 6 months prior to admission? : No Specify Current Suicidal Plan:  (no plan ) Access to Means: No Specify Access to Suicidal Means:  (no access to firearms) What has been your use of drugs/alcohol within the last 12 months?:  (crack cocaine ) Previous Attempts/Gestures: Yes How many times?:  (1x) Other Self Harm Risks:  (history of cuttin g) Triggers for Past Attempts: Unpredictable Intentional Self Injurious Behavior: Cutting Comment - Self Injurious Behavior:  (history of cuttin g) Family Suicide History: No Recent stressful life event(s): Other (Comment) (no supports, health, and chronic pain) Persecutory voices/beliefs?: Yes Depression: Yes Depression Symptoms: Tearfulness Substance abuse history and/or treatment for substance abuse?: Yes Suicide prevention information given to non-admitted patients: Not applicable  Risk to Others within the past 6 months Homicidal Ideation: Yes-Currently Present Does patient have any lifetime risk of  violence toward others beyond the six months prior to admission? : Yes (comment) Thoughts of Harm to Others: Yes-Currently Present Comment - Thoughts of Harm to Others:  ("I have thoughts to harm others,no specified person,no plan") Current Homicidal Intent: No Current Homicidal Plan: No Access to Homicidal Means: No Identified Victim:  (no victim; patient denie victim) History of harm to others?: No Assessment of Violence: None Noted Violent Behavior Description:  (patient is calm and cooperative) Does patient have access to weapons?: No Criminal Charges Pending?: No Does patient have a court date: No Is patient on probation?: No  Psychosis Hallucinations: Visual ("I see shadows") Delusions: None noted  Mental Status Report Appearance/Hygiene: In  scrubs Eye Contact: Poor Speech: Logical/coherent Level of Consciousness: Quiet/awake Mood: Depressed Affect: Other (Comment) (congruent with mood. ) Anxiety Level: None Thought Processes: Relevant Judgement: Partial Orientation: Other (Comment) (year, city and state.) Obsessive Compulsive Thoughts/Behaviors: None  Cognitive Functioning Concentration: Normal Memory: Recent Intact, Recent Impaired IQ: Average Insight: Fair Impulse Control: Poor Appetite: Poor Weight Loss:  (0) Weight Gain:  (none reported) Sleep: Decreased Total Hours of Sleep:  (2-3 hrs ) Vegetative Symptoms: None  ADLScreening [facility] Assessment Services) Patient's cognitive ability adequate to safely complete daily activities?: Yes Patient able to express need for assistance with ADLs?: Yes Independently performs ADLs?: No  Prior Inpatient Therapy Prior Inpatient Therapy: Yes Prior Therapy Dates: 2017 Prior Therapy Facilty/Provider(s): [facility] Reason for Treatment: suicide attempts.   Prior Outpatient Therapy Prior Outpatient Therapy: No Prior Therapy Dates: NA Prior Therapy Facilty/Provider(s): NA Reason for Treatment: NA Does patient have an ACCT team?: No Does patient have Intensive In-House Services?  : No Does patient have [facility] services? : No Does patient have P4CC services?: No  ADL Screening (condition at time of admission) Patient's cognitive ability adequate to safely complete daily activities?: Yes Patient able to express need for assistance with ADLs?: Yes Independently performs ADLs?: No       Abuse/Neglect Assessment (Assessment to be complete while patient is alone) Physical Abuse: Denies Verbal Abuse: Denies Sexual Abuse: Denies Exploitation of patient/patient's resources: Denies Self-Neglect: Denies Values / Beliefs Cultural Requests During Hospitalization: None Spiritual Requests During Hospitalization: None   Advance Directives (For Healthcare) Does Patient Have a  Medical Advance Directive?: No Would patient like information on creating a medical advance directive?: No - Patient declined Nutrition Screen- [facility] Adult/[facility] Patient's home diet: Regular  Additional Information 1:1 In Past 12 Months?: No CIRT Risk: No Elopement Risk: No Does patient have medical clearance?: Yes     Disposition:  Disposition Initial Assessment Completed for this Encounter: Yes Other disposition(s): Other (Comment)  On Site Evaluation by:   Reviewed with Physician:    Waldon [name] 01/09/2017 12:10 PM

## 2017-01-09 NOTE — Congregational Nurse Program (Signed)
Congregational Nurse Program Note  Date of Encounter: 12/24/2016  Past Medical History: Past Medical History:  Diagnosis Date  . A-fib ([place])   . Asthma    No PFTs, history of childhood asthma  . CAD (coronary artery disease)    a. 06/2013 STEMI/PCI ([facility]): LAD w/ thrombus (treated with BMS), mid 75%, D2 75%; LCX OM2 75%; RCA small, PDA 95%, PLV 95%;  b. 10/2013 Cath/PCI: ISR w/in LAD (Promus DES x 2), borderline OM2 lesion;  c. 01/2014 MV: Intermediate risk, medium-sized distal ant wall infarct w/ very small amt of peri-infarct ischemia. EF 60%.  . Cellulitis 04/2014   left facial  . Chondromalacia of medial femoral condyle    Left knee MRI 04/28/12: Chondromalacia of the medial femoral condyle with slight peripheral degeneration of the meniscocapsular junction of the medial meniscus; followed by sports medicine  . Collagen vascular disease ([place])   . Crack cocaine use    for 20+ years, has been enrolled in detox programs in the past  . Depression    with history of hospitalization for suicidal ideation  . Diabetes mellitus 2002   Diagnosed in 2002, started insulin in 2012  . Gout   . Gout 04/28/2012  . Headache(784.0)    CT head 08/2011: Periventricular and subcortical white matter hypodensities are most in keeping with chronic microangiopathic change  . HIV infection [facility]) Nov 2012   Followed by [name]  . Hyperlipidemia   . Hypertension   . Pulmonary embolism [facility])     Encounter Details:     CNP Questionnaire - 12/24/16 1015      Patient Demographics   Is this a new or existing patient? Existing   Patient is considered a/an Not Applicable   Race African-American/Black     Patient Assistance   Location of Patient Assistance [facility]   Patient's financial/insurance status Medicare;Low Income   Uninsured Patient ([facility]) No   Patient referred to apply for the following financial assistance Not Applicable   Food insecurities addressed Not Applicable   Transportation assistance Yes   Type of Assistance Bus Pass Given   Assistance securing medications No   Type of Assistance Other   Educational health offerings Navigating the healthcare system;Hypertension     Encounter Details   Primary purpose of visit Education/Health Concerns   Was an Emergency Department visit averted? Not Applicable   Does patient have a medical provider? No   Patient referred to Establish PCP   Was a mental health screening completed? (GAINS tool) No   Does patient have dental issues? No   Does patient have vision issues? Yes   Was a vision referral made? No   Does your patient have an abnormal blood pressure today? Yes   Since previous encounter, have you referred patient for abnormal blood pressure that resulted in a new diagnosis or medication change? No   Does your patient have an abnormal blood glucose today? No   Since previous encounter, have you referred patient for abnormal blood glucose that resulted in a new diagnosis or medication change? No   Was there a life-saving intervention made? No         Clinical Intake - 12/26/16 1038      Pre-visit preparation   Pre-visit preparation completed Yes     Abuse/Neglect   Do you feel unsafe in your current relationship? No   Do you feel physically threatened by others? No   Anyone hurting you at home, work, or school? No  Unable to ask? No     Patient Literacy   How often do you need to have someone help you when you read instructions, pamphlets, or other written materials from your doctor or pharmacy? 1 - Never   What is the last grade level you completed in school? 12th     Client is scheduled for PCP appointment at [place] on 3/222. Bus passes given. Client instructed to follow up with CN on 3/23 to discuss appointment and for medication teaching.

## 2017-01-09 NOTE — ED Notes (Signed)
Pt ambulated in hallway   

## 2017-01-09 NOTE — Congregational Nurse Program (Signed)
Congregational Nurse Program Note  Date of Encounter: 12/20/2016  Past Medical History: Past Medical History:  Diagnosis Date  . A-fib ([place])   . Asthma    No PFTs, history of childhood asthma  . CAD (coronary artery disease)    a. 06/2013 STEMI/PCI ([facility]): LAD w/ thrombus (treated with BMS), mid 75%, D2 75%; LCX OM2 75%; RCA small, PDA 95%, PLV 95%;  b. 10/2013 Cath/PCI: ISR w/in LAD (Promus DES x 2), borderline OM2 lesion;  c. 01/2014 MV: Intermediate risk, medium-sized distal ant wall infarct w/ very small amt of peri-infarct ischemia. EF 60%.  . Cellulitis 04/2014   left facial  . Chondromalacia of medial femoral condyle    Left knee MRI 04/28/12: Chondromalacia of the medial femoral condyle with slight peripheral degeneration of the meniscocapsular junction of the medial meniscus; followed by sports medicine  . Collagen vascular disease ([place])   . Crack cocaine use    for 20+ years, has been enrolled in detox programs in the past  . Depression    with history of hospitalization for suicidal ideation  . Diabetes mellitus 2002   Diagnosed in 2002, started insulin in 2012  . Gout   . Gout 04/28/2012  . Headache(784.0)    CT head 08/2011: Periventricular and subcortical white matter hypodensities are most in keeping with chronic microangiopathic change  . HIV infection [facility]) Nov 2012   Followed by [name]  . Hyperlipidemia   . Hypertension   . Pulmonary embolism [facility])     Encounter Details:     CNP Questionnaire - 12/20/16 0930      Patient Demographics   Is this a new or existing patient? New   Patient is considered a/an Not Applicable   Race African-American/Black     Patient Assistance   Location of Patient Assistance [facility]   Patient's financial/insurance status Medicare;Low Income   Uninsured Patient ([facility]) No   Patient referred to apply for the following financial assistance Not Applicable   Food insecurities addressed Not Research scientist (physical sciences) No   Educational health offerings Medications;Navigating the healthcare system;Safety     Encounter Details   Primary purpose of visit Education/Health Concerns;Safety   Was an Emergency Department visit averted? Yes   Does patient have a medical provider? No   Patient referred to Establish PCP   Was a mental health screening completed? (GAINS tool) No   Does patient have dental issues? No   Does patient have vision issues? Yes   Was a vision referral made? No   Does your patient have an abnormal blood pressure today? No   Since previous encounter, have you referred patient for abnormal blood pressure that resulted in a new diagnosis or medication change? No   Does your patient have an abnormal blood glucose today? No   Since previous encounter, have you referred patient for abnormal blood glucose that resulted in a new diagnosis or medication change? No   Was there a life-saving intervention made? No         Clinical Intake - 12/26/16 1038      Pre-visit preparation   Pre-visit preparation completed Yes     Abuse/Neglect   Do you feel unsafe in your current relationship? No   Do you feel physically threatened by others? No   Anyone hurting you at home, work, or school? No   Unable to ask? No     Patient Literacy   How often do you need to have  someone help you when you read instructions, pamphlets, or other written materials from your doctor or pharmacy? 1 - Never   What is the last grade level you completed in school? 12th     New client. States that he has multiple health issues and is seeking substance abuse assistance. Client states he was seen at [facility]. CN contacted clinic for appointment but client has been discharged. CN contacted [facility] [employer] as client is out of most medications. They will deliver medication. CN made appointment for client at [facility] after clinic visit and will notify client. Client  has no phone at present.

## 2017-01-09 NOTE — BH Assessment (Addendum)
[facility] Assessment Progress Note  Per Waylan Boga, DNP, this pt requires psychiatric hospitalization at this time.  The following facilities have been contacted to seek placement for this pt, with results as noted:  Beds available, information sent, decision pending:  [facility]   At capacity:  [facility], [place] Triage Specialist [phone]

## 2017-01-09 NOTE — ED Notes (Signed)
Patient reports increased depression recently and feeling a lot of stress. Reports increased SI and HI. No specific person named with the HI at this time. Patient searched for contraband and none found. Drink and snack given. TV tuned on at present. No physical complaints. Cooperative on the unit. Will monitor.

## 2017-01-10 DIAGNOSIS — Z813 Family history of other psychoactive substance abuse and dependence: Secondary | ICD-10-CM

## 2017-01-10 DIAGNOSIS — Z79899 Other long term (current) drug therapy: Secondary | ICD-10-CM

## 2017-01-10 DIAGNOSIS — F1424 Cocaine dependence with cocaine-induced mood disorder: Secondary | ICD-10-CM | POA: Diagnosis not present

## 2017-01-10 LAB — CBG MONITORING, ED: Glucose-Capillary: 230 mg/dL — ABNORMAL HIGH (ref 65–99)

## 2017-01-10 MED ORDER — SERTRALINE HCL 50 MG PO TABS
50.0000 mg | ORAL_TABLET | Freq: Every day | ORAL | Status: DC
  Administered 2017-01-10: 50 mg via ORAL

## 2017-01-10 MED ORDER — SERTRALINE HCL 50 MG PO TABS: 50.0000 mg | ORAL_TABLET | Freq: Every day | ORAL | 0 refills | Status: DC

## 2017-01-10 NOTE — ED Notes (Signed)
Pt discharged ambulatory with resources for intensive outpatient programs.  RX and discharge instructions were reviewed.  Bus pass was given.  Pt was in no distress at discharge. All belongings were returned to pt.

## 2017-01-10 NOTE — Discharge Instructions (Signed)
To help you maintain a sober lifestyle, a substance abuse treatment program may be beneficial to you.  The following providers offer Chemical Dependency Intensive Outpatient Programs.  These programs meet several hours a day, several times a week.  Contact them at your earliest opportunity to ask about enrolling in a program:  OUTPATIENT PROGRAMS:       [facility]. [address]. [place], [place] [address]      Contact person: Brandon Melnick, LCAS      [phone]       [facility]      [address], [place]. [place], [place] [address]      [phone]      New patients are seen at the walk-in clinic every Tuesday from 9:00 am - 12:00 pm.       [facility]      [place],  [address]      [phone]

## 2017-01-10 NOTE — Consult Note (Signed)
[facility] Consult   Reason for Consult:  Cocaine abuse with suicidal ideations Referring Physician:  EDP Patient Identification: Michael [name] MRN:  [id number] Principal Diagnosis: Cocaine dependence with cocaine-induced mood disorder [facility]) Diagnosis:   Patient Active Problem List   Diagnosis Date Noted  . Cocaine dependence with cocaine-induced mood disorder ([place]) [F14.24]     Priority: High  . Cocaine use disorder, severe, dependence ([place]) [F14.20] 12/13/2014    Priority: High  . Hepatitis C [B19.20] 12/30/2016  . Substance induced mood disorder ([place]) [F19.94] 11/23/2016  . Back pain [M54.9] 04/18/2016  . S/P carotid endarterectomy [Z98.890] 11/15/2015  . MDD (major depressive disorder), recurrent severe, without psychosis ([place]) [F33.2] 09/09/2015  . Cocaine-induced mood disorder ([place]) [F14.94] 08/14/2015  . Gout [M10.9] 07/10/2015  . Cough [R05] 04/26/2015  . Lactic acidosis [E87.2] 03/06/2015  . CKD (chronic kidney disease) stage 3, GFR 30-59 ml/min [N18.3] 03/06/2015  . Normocytic anemia [D64.9] 03/06/2015  . Hypoglycemia [E16.2]   . Encounter for general adult medical examination with abnormal findings [Z00.01] 02/09/2015  . Substance or medication-induced depressive disorder with onset during withdrawal ([place]) [F19.94] 12/13/2014  . Cervicalgia [M54.2] 06/28/2014  . Lumbar radiculopathy, chronic [M54.16] 06/28/2014  . Asthma, chronic [J45.909] 02/03/2014  . 3-vessel CAD [I25.10] 06/24/2013  . ED (erectile dysfunction) of organic origin [N52.9] 07/07/2012  . Hypertension goal BP (blood pressure) < 140/80 [I10] 04/29/2012  . Chondromalacia of left knee [M94.262] 03/19/2012  . Hyperlipidemia with target LDL less than 100 [E78.5] 02/12/2012  . Fibromyalgia [M79.7] 02/12/2012  . HIV (human immunodeficiency virus infection) ([place]) [B20] 08/27/2011  . Uncontrolled type 2 diabetes with neuropathy ([place]) [E11.40, E11.65] 10/17/2000    Total Time spent with  patient: 45 minutes  Subjective:   Michael [name] is a 66 y.o. male patient does not warrant admission.  HPI:  66 yo male who presented to the ED after abusing cocaine and having suicidal ideations.  Today, he denies suicidal/homicidal ideations, hallucinations, and withdrawal symptoms.  He was in February and did not go to follow-up and cannot remember what it was, information made available to him.  Stable for discharge.  Past Psychiatric History: substance abuse, depression  Risk to Self: None Risk to Others:None Prior Inpatient Therapy: Prior Inpatient Therapy: Yes Prior Therapy Dates: 2017 Prior Therapy Facilty/Provider(s): [facility] Reason for Treatment: suicide attempts.  Prior Outpatient Therapy: Prior Outpatient Therapy: No Prior Therapy Dates: NA Prior Therapy Facilty/Provider(s): NA Reason for Treatment: NA Does patient have an ACCT team?: No Does patient have Intensive In-House Services?  : No Does patient have [facility] services? : No Does patient have P4CC services?: No  Past Medical History:  Past Medical History:  Diagnosis Date  . A-fib ([place])   . Asthma    No PFTs, history of childhood asthma  . CAD (coronary artery disease)    a. 06/2013 STEMI/PCI ([facility]): LAD w/ thrombus (treated with BMS), mid 75%, D2 75%; LCX OM2 75%; RCA small, PDA 95%, PLV 95%;  b. 10/2013 Cath/PCI: ISR w/in LAD (Promus DES x 2), borderline OM2 lesion;  c. 01/2014 MV: Intermediate risk, medium-sized distal ant wall infarct w/ very small amt of peri-infarct ischemia. EF 60%.  . Cellulitis 04/2014   left facial  . Chondromalacia of medial femoral condyle    Left knee MRI 04/28/12: Chondromalacia of the medial femoral condyle with slight peripheral degeneration of the meniscocapsular junction of the medial meniscus; followed by sports medicine  . Collagen vascular disease ([place])   . Crack  cocaine use    for 20+ years, has been enrolled in detox programs in the past  . Depression    with history of  hospitalization for suicidal ideation  . Diabetes mellitus 2002   Diagnosed in 2002, started insulin in 2012  . Gout   . Gout 04/28/2012  . Headache(784.0)    CT head 08/2011: Periventricular and subcortical white matter hypodensities are most in keeping with chronic microangiopathic change  . HIV infection [facility]) Nov 2012   Followed by [name]  . Hyperlipidemia   . Hypertension   . Pulmonary embolism [facility])     Past Surgical History:  Procedure Laterality Date  . BACK SURGERY     1988  . BOWEL RESECTION    . CARDIAC SURGERY    . CERVICAL SPINE SURGERY     " rods in my neck "  . CORONARY STENT PLACEMENT    . NM MYOCAR PERF WALL MOTION  12/27/2011   normal  . SPINE SURGERY     Family History:  Family History  Problem Relation Age of Onset  . Diabetes Mother   . Hypertension Mother   . Hyperlipidemia Mother   . Diabetes Father   . Cancer Father   . Hypertension Father   . Diabetes Brother   . Heart disease Brother   . Diabetes Sister   . Colon cancer Neg Hx    Family Psychiatric  History: substance abuse Social History:  History  Alcohol Use No     History  Drug Use  . Types: "Crack" cocaine, Cocaine    Comment: last use January 2018    Social History   Social History  . Marital status: Divorced    Spouse name: N/A  . Number of children: N/A  . Years of education: 18   Occupational History  .  Unemployed    04/2016   Social History Main Topics  . Smoking status: Never Smoker  . Smokeless tobacco: Never Used  . Alcohol use No  . Drug use: Yes    Types: "Crack" cocaine, Cocaine     Comment: last use January 2018  . Sexual activity: Yes     Comment: accepted condoms   Other Topics Concern  . None   Social History Narrative   Currently staying with a friend in [place].  Was staying @ local motel until a few days ago - left b/c of bed bugs.   Additional Social History:    Allergies:  No Known Allergies  Labs:  Results for orders placed or  performed during the hospital encounter of 01/09/17 (from the past 48 hour(s))  Rapid urine drug screen (hospital performed)     Status: Abnormal   Collection Time: 01/09/17 10:56 AM  Result Value Ref Range   Opiates NONE DETECTED NONE DETECTED   Cocaine POSITIVE (A) NONE DETECTED   Benzodiazepines NONE DETECTED NONE DETECTED   Amphetamines NONE DETECTED NONE DETECTED   Tetrahydrocannabinol NONE DETECTED NONE DETECTED   Barbiturates NONE DETECTED NONE DETECTED    Comment:        DRUG SCREEN FOR MEDICAL PURPOSES ONLY.  IF CONFIRMATION IS NEEDED FOR ANY PURPOSE, NOTIFY LAB WITHIN 5 DAYS.        LOWEST DETECTABLE LIMITS FOR URINE DRUG SCREEN Drug Class       Cutoff (ng/mL) Amphetamine      1000 Barbiturate      200 Benzodiazepine   329 Tricyclics       518 Opiates  300 Cocaine          300 THC              50   Comprehensive metabolic panel     Status: Abnormal   Collection Time: 01/09/17 11:52 AM  Result Value Ref Range   Sodium 139 135 - 145 mmol/L   Potassium 3.7 3.5 - 5.1 mmol/L   Chloride 107 101 - 111 mmol/L   CO2 25 22 - 32 mmol/L   Glucose, Bld 343 (H) 65 - 99 mg/dL   BUN 22 (H) 6 - 20 mg/dL   Creatinine, Ser 1.46 (H) 0.61 - 1.24 mg/dL   Calcium 9.2 8.9 - 10.3 mg/dL   Total Protein 7.8 6.5 - 8.1 g/dL   Albumin 4.1 3.5 - 5.0 g/dL   AST 27 15 - 41 U/L   ALT 17 17 - 63 U/L   Alkaline Phosphatase 69 38 - 126 U/L   Total Bilirubin 0.7 0.3 - 1.2 mg/dL   GFR calc non Af Amer 48 (L) >60 mL/min   GFR calc Af Amer 56 (L) >60 mL/min    Comment: (NOTE) The eGFR has been calculated using the CKD EPI equation. This calculation has not been validated in all clinical situations. eGFR's persistently <60 mL/min signify possible Chronic Kidney Disease.    Anion gap 7 5 - 15  Ethanol     Status: None   Collection Time: 01/09/17 11:52 AM  Result Value Ref Range   Alcohol, Ethyl (B) <5 <5 mg/dL    Comment:        LOWEST DETECTABLE LIMIT FOR SERUM ALCOHOL IS 5  mg/dL FOR MEDICAL PURPOSES ONLY   Salicylate level     Status: None   Collection Time: 01/09/17 11:52 AM  Result Value Ref Range   Salicylate Lvl <3.2 2.8 - 30.0 mg/dL  Acetaminophen level     Status: Abnormal   Collection Time: 01/09/17 11:52 AM  Result Value Ref Range   Acetaminophen (Tylenol), Serum <10 (L) 10 - 30 ug/mL    Comment:        THERAPEUTIC CONCENTRATIONS VARY SIGNIFICANTLY. A RANGE OF 10-30 ug/mL MAY BE AN EFFECTIVE CONCENTRATION FOR MANY PATIENTS. HOWEVER, SOME ARE BEST TREATED AT CONCENTRATIONS OUTSIDE THIS RANGE. ACETAMINOPHEN CONCENTRATIONS >150 ug/mL AT 4 HOURS AFTER INGESTION AND >50 ug/mL AT 12 HOURS AFTER INGESTION ARE OFTEN ASSOCIATED WITH TOXIC REACTIONS.   cbc     Status: Abnormal   Collection Time: 01/09/17 11:52 AM  Result Value Ref Range   WBC 4.4 4.0 - 10.5 K/uL    Comment: WHITE COUNT CONFIRMED ON SMEAR   RBC 4.26 4.22 - 5.81 MIL/uL   Hemoglobin 10.9 (L) 13.0 - 17.0 g/dL   HCT 33.4 (L) 39.0 - 52.0 %   MCV 78.4 78.0 - 100.0 fL   MCH 25.6 (L) 26.0 - 34.0 pg   MCHC 32.6 30.0 - 36.0 g/dL   RDW 14.2 11.5 - 15.5 %   Platelets 155 150 - 400 K/uL  CBG monitoring, ED     Status: Abnormal   Collection Time: 01/09/17  5:49 PM  Result Value Ref Range   Glucose-Capillary 221 (H) 65 - 99 mg/dL  CBG monitoring, ED     Status: Abnormal   Collection Time: 01/09/17  9:38 PM  Result Value Ref Range   Glucose-Capillary 194 (H) 65 - 99 mg/dL  CBG monitoring, ED     Status: Abnormal   Collection Time: 01/10/17  7:46 AM  Result  Value Ref Range   Glucose-Capillary 230 (H) 65 - 99 mg/dL    Current Facility-Administered Medications  Medication Dose Route Frequency Provider Last Rate Last Dose  . albuterol (PROVENTIL HFA;VENTOLIN HFA) 108 (90 Base) MCG/ACT inhaler 2 puff  2 puff Inhalation Q6H PRN Charlesetta Shanks, MD      . diltiazem (CARDIZEM CD) 24 hr capsule 120 mg  120 mg Oral Daily Charlesetta Shanks, MD   120 mg at 01/09/17 1811  . DULoxetine (CYMBALTA)  DR capsule 30 mg  30 mg Oral Daily Charlesetta Shanks, MD   30 mg at 01/09/17 1726  . emtricitabine-rilpivir-tenofovir AF (ODEFSEY) 200-25-25 MG per tablet 1 tablet  1 tablet Oral Q breakfast Charlesetta Shanks, MD   1 tablet at 01/10/17 0756  . famotidine (PEPCID) tablet 20 mg  20 mg Oral Daily Charlesetta Shanks, MD   20 mg at 01/09/17 1726  . gabapentin (NEURONTIN) capsule 400 mg  400 mg Oral TID Charlesetta Shanks, MD   400 mg at 01/09/17 2143  . glipiZIDE (GLUCOTROL XL) 24 hr tablet 5 mg  5 mg Oral Q breakfast Charlesetta Shanks, MD   5 mg at 01/10/17 0756  . hydrochlorothiazide (MICROZIDE) capsule 12.5 mg  12.5 mg Oral Daily Berton Mount, RPH   12.5 mg at 01/09/17 1812  . insulin aspart (novoLOG) injection 5 Units  5 Units Subcutaneous TID WC Charlesetta Shanks, MD   5 Units at 01/10/17 0757  . insulin glargine (LANTUS) injection 25 Units  25 Units Subcutaneous QHS Charlesetta Shanks, MD   25 Units at 01/09/17 2143  . isosorbide mononitrate (IMDUR) 24 hr tablet 30 mg  30 mg Oral Daily Charlesetta Shanks, MD   30 mg at 01/09/17 1812  . losartan (COZAAR) tablet 50 mg  50 mg Oral Daily Charlesetta Shanks, MD   50 mg at 01/09/17 1812  . metFORMIN (GLUCOPHAGE) tablet 500 mg  500 mg Oral BID [name], MD   500 mg at 01/10/17 0756  . pravastatin (PRAVACHOL) tablet 40 mg  40 mg Oral QHS Charlesetta Shanks, MD   40 mg at 01/09/17 2143  . Rivaroxaban (XARELTO) tablet 15 mg  15 mg Oral BID WC Berton Mount, RPH   15 mg at 01/10/17 0756  . [START ON 01/17/2017] rivaroxaban (XARELTO) tablet 20 mg  20 mg Oral Q supper Berton Mount, RPH      . sertraline (ZOLOFT) tablet 25 mg  25 mg Oral Daily Charlesetta Shanks, MD   25 mg at 01/09/17 1726   Current Outpatient Prescriptions  Medication Sig Dispense Refill  . albuterol (PROAIR HFA) 108 (90 Base) MCG/ACT inhaler Inhale 2 puffs into the lungs every 6 (six) hours as needed for wheezing or shortness of breath. 1 Inhaler 0  . blood glucose meter kit and supplies KIT Dispense based on patient  and insurance preference. Use up to four times daily as directed. (FOR ICD-9 250.00, 250.01). 1 each 0  . diltiazem (CARDIZEM CD) 120 MG 24 hr capsule Take 1 capsule (120 mg total) by mouth daily. 30 capsule 0  . DULoxetine (CYMBALTA) 30 MG capsule Take 30 mg by mouth daily.    Marland Kitchen emtricitabine-rilpivir-tenofovir DF (COMPLERA) 200-25-300 MG tablet Take 1 tablet by mouth daily. 30 tablet 0  . famotidine (PEPCID) 20 MG tablet Take 1 tablet (20 mg total) by mouth daily. 30 tablet 1  . gabapentin (NEURONTIN) 400 MG capsule Take 1 capsule (400 mg total) by mouth 3 (three) times daily. 90 capsule 2  .  glipiZIDE (GLUCOTROL XL) 5 MG 24 hr tablet Take 5 mg by mouth daily.    . hydrochlorothiazide (HYDRODIURIL) 12.5 MG tablet Take 1 tablet (12.5 mg total) by mouth daily. 90 tablet 3  . insulin glargine (LANTUS) 100 unit/mL SOPN Inject 0.25 mLs (25 Units total) into the skin at bedtime. 12 mL 1  . insulin lispro (HUMALOG) 100 UNIT/ML injection Inject 0.05 mLs (5 Units total) into the skin 3 (three) times daily before meals. Sliding scale as directed. 10 mL 5  . Insulin Syringe-Needle U-100 (B-D INS SYR MICROFINE 1CC/27G) 27G X 5/8" 1 ML MISC 1 strip by Does not apply route 3 (three) times daily as needed. 30 each 6  . isosorbide mononitrate (IMDUR) 30 MG 24 hr tablet Take 1 tablet (30 mg total) by mouth daily. 30 tablet 0  . losartan (COZAAR) 50 MG tablet Take 1 tablet (50 mg total) by mouth daily. 14 tablet 0  . metFORMIN (GLUCOPHAGE) 500 MG tablet Take 2 tablets (1,000 mg total) by mouth 2 (two) times daily before a meal. 180 tablet 1  . pravastatin (PRAVACHOL) 40 MG tablet Take 1 tablet (40 mg total) by mouth at bedtime. 90 tablet 1  . Rivaroxaban 15 & 20 MG TBPK Take as directed on package: Start with one 16m tablet by mouth twice a day with food. On Day 22, switch to one 26mtablet once a day with food. 51 each 0  . sertraline (ZOLOFT) 25 MG tablet Take 1 tablet (25 mg total) by mouth daily. 90 tablet 1   . hydrocortisone cream 1 % Apply to affected area 2 times daily (Patient not taking: Reported on 01/09/2017) 15 g 0    Musculoskeletal: Strength & Muscle Tone: within normal limits Gait & Station: normal Patient leans: N/A  Psychiatric Specialty Exam: Physical Exam  Constitutional: He is oriented to person, place, and time. He appears well-developed and well-nourished.  HENT:  Head: Normocephalic.  Neck: Normal range of motion.  Respiratory: Effort normal.  Musculoskeletal: Normal range of motion.  Neurological: He is alert and oriented to person, place, and time.  Psychiatric: His speech is normal and behavior is normal. Judgment and thought content normal. Cognition and memory are normal. He exhibits a depressed mood.    Review of Systems  Psychiatric/Behavioral: Positive for depression and substance abuse.  All other systems reviewed and are negative.   Blood pressure 100/60, pulse 82, temperature 98 F (36.7 C), temperature source Oral, resp. rate 18, SpO2 98 %.There is no height or weight on file to calculate BMI.  General Appearance: Casual  Eye Contact:  Good  Speech:  Normal Rate  Volume:  Normal  Mood:  Depressed, mild  Affect:  Congruent  Thought Process:  Coherent and Descriptions of Associations: Intact  Orientation:  Full (Time, Place, and Person)  Thought Content:  WDL and Logical  Suicidal Thoughts:  No  Homicidal Thoughts:  No  Memory:  Immediate;   Good Recent;   Good Remote;   Good  Judgement:  Fair  Insight:  Fair  Psychomotor Activity:  Normal  Concentration:  Concentration: Good and Attention Span: Good  Recall:  Good  Fund of Knowledge:  Good  Language:  Good  Akathisia:  No  Handed:  Right  AIMS (if indicated):     Assets:  Leisure Time Physical Health Resilience  ADL's:  Intact  Cognition:  WNL  Sleep:        Treatment Plan Summary: Daily contact with patient to assess  and evaluate symptoms and progress in treatment, Medication  management and Plan cocaine abuse with cocaine induced mood disorder:  -Crisis stabilization -Medication management:  Continued medical medications along with Zoloft 25 mg increased to 50 mg and Cymbalta 30 mg daily for depression, Gabapentin 400 mg TID for mood stabilization and cocaine withdrawal -Individual and substance abuse counseling -Outpatient resources  Disposition: No evidence of imminent risk to self or others at present.    Waylan Boga, NP 01/10/2017 9:44 AM  Patient seen face-to-face for psychiatric evaluation, chart reviewed and case discussed with the physician extender and developed treatment plan. Reviewed the information documented and agree with the treatment plan. Corena Pilgrim, MD

## 2017-01-10 NOTE — BH Assessment (Signed)
[facility] Assessment Progress Note  Per Corena Pilgrim, MD, this pt does not require psychiatric hospitalization at this time.  Pt is to be discharged from [facility] with referral information for area Chemical Dependency Intensive Outpatient Programs.  Discharge instructions include referral information for the [facility] at [facility], as well as [facility] and the [place].  Pt's nurse, Nena Jordan, has been notified.  Jalene Mullet, [place] Triage Specialist [phone]

## 2017-01-10 NOTE — [facility] Suicide Risk Assessment (Signed)
Suicide Risk Assessment  Discharge Assessment   [facility] Discharge Suicide Risk Assessment   Principal Problem: Cocaine dependence with cocaine-induced mood disorder [facility]) Discharge Diagnoses:  Patient Active Problem List   Diagnosis Date Noted  . Cocaine dependence with cocaine-induced mood disorder ([place]) [F14.24]     Priority: High  . Cocaine use disorder, severe, dependence ([place]) [F14.20] 12/13/2014    Priority: High  . Hepatitis C [B19.20] 12/30/2016  . Substance induced mood disorder ([place]) [F19.94] 11/23/2016  . Back pain [M54.9] 04/18/2016  . S/P carotid endarterectomy [Z98.890] 11/15/2015  . MDD (major depressive disorder), recurrent severe, without psychosis ([place]) [F33.2] 09/09/2015  . Cocaine-induced mood disorder ([place]) [F14.94] 08/14/2015  . Gout [M10.9] 07/10/2015  . Cough [R05] 04/26/2015  . Lactic acidosis [E87.2] 03/06/2015  . CKD (chronic kidney disease) stage 3, GFR 30-59 ml/min [N18.3] 03/06/2015  . Normocytic anemia [D64.9] 03/06/2015  . Hypoglycemia [E16.2]   . Encounter for general adult medical examination with abnormal findings [Z00.01] 02/09/2015  . Substance or medication-induced depressive disorder with onset during withdrawal ([place]) [F19.94] 12/13/2014  . Cervicalgia [M54.2] 06/28/2014  . Lumbar radiculopathy, chronic [M54.16] 06/28/2014  . Asthma, chronic [J45.909] 02/03/2014  . 3-vessel CAD [I25.10] 06/24/2013  . ED (erectile dysfunction) of organic origin [N52.9] 07/07/2012  . Hypertension goal BP (blood pressure) < 140/80 [I10] 04/29/2012  . Chondromalacia of left knee [M94.262] 03/19/2012  . Hyperlipidemia with target LDL less than 100 [E78.5] 02/12/2012  . Fibromyalgia [M79.7] 02/12/2012  . HIV (human immunodeficiency virus infection) ([place]) [B20] 08/27/2011  . Uncontrolled type 2 diabetes with neuropathy ([place]) [E11.40, E11.65] 10/17/2000    Total Time spent with patient: 45 minutes  Musculoskeletal: Strength & Muscle Tone: within normal  limits Gait & Station: normal Patient leans: N/A  Psychiatric Specialty Exam: Physical Exam  Constitutional: He is oriented to person, place, and time. He appears well-developed and well-nourished.  HENT:  Head: Normocephalic.  Neck: Normal range of motion.  Respiratory: Effort normal.  Musculoskeletal: Normal range of motion.  Neurological: He is alert and oriented to person, place, and time.  Psychiatric: His speech is normal and behavior is normal. Judgment and thought content normal. Cognition and memory are normal. He exhibits a depressed mood.    Review of Systems  Psychiatric/Behavioral: Positive for depression and substance abuse.  All other systems reviewed and are negative.   Blood pressure 100/60, pulse 82, temperature 98 F (36.7 C), temperature source Oral, resp. rate 18, SpO2 98 %.There is no height or weight on file to calculate BMI.  General Appearance: Casual  Eye Contact:  Good  Speech:  Normal Rate  Volume:  Normal  Mood:  Depressed, mild  Affect:  Congruent  Thought Process:  Coherent and Descriptions of Associations: Intact  Orientation:  Full (Time, Place, and Person)  Thought Content:  WDL and Logical  Suicidal Thoughts:  No  Homicidal Thoughts:  No  Memory:  Immediate;   Good Recent;   Good Remote;   Good  Judgement:  Fair  Insight:  Fair  Psychomotor Activity:  Normal  Concentration:  Concentration: Good and Attention Span: Good  Recall:  Good  Fund of Knowledge:  Good  Language:  Good  Akathisia:  No  Handed:  Right  AIMS (if indicated):     Assets:  Leisure Time Physical Health Resilience  ADL's:  Intact  Cognition:  WNL  Sleep:       Mental Status Per Nursing Assessment::   On Admission:    cocaine abuse with  suicidal ideations  Demographic Factors:  Male, over 4  Loss Factors: NA  Historical Factors: NA  Risk Reduction Factors:   Sense of responsibility to family  Continued Clinical Symptoms:  Depression,  mild  Cognitive Features That Contribute To Risk:  None    Suicide Risk:  Minimal: No identifiable suicidal ideation.  Patients presenting with no risk factors but with morbid ruminations; may be classified as minimal risk based on the severity of the depressive symptoms    Plan Of Care/Follow-up recommendations:  Activity:  as tolerated Diet:  heart healthy diet  Burnham Trost, NP 01/10/2017, 9:51 AM

## 2017-01-13 NOTE — Congregational Nurse Program (Signed)
Congregational Nurse Program Note  Date of Encounter: 12/27/2016  Past Medical History: Past Medical History:  Diagnosis Date  . A-fib ([place])   . Asthma    No PFTs, history of childhood asthma  . CAD (coronary artery disease)    a. 06/2013 STEMI/PCI ([facility]): LAD w/ thrombus (treated with BMS), mid 75%, D2 75%; LCX OM2 75%; RCA small, PDA 95%, PLV 95%;  b. 10/2013 Cath/PCI: ISR w/in LAD (Promus DES x 2), borderline OM2 lesion;  c. 01/2014 MV: Intermediate risk, medium-sized distal ant wall infarct w/ very small amt of peri-infarct ischemia. EF 60%.  . Cellulitis 04/2014   left facial  . Chondromalacia of medial femoral condyle    Left knee MRI 04/28/12: Chondromalacia of the medial femoral condyle with slight peripheral degeneration of the meniscocapsular junction of the medial meniscus; followed by sports medicine  . Collagen vascular disease ([place])   . Crack cocaine use    for 20+ years, has been enrolled in detox programs in the past  . Depression    with history of hospitalization for suicidal ideation  . Diabetes mellitus 2002   Diagnosed in 2002, started insulin in 2012  . Gout   . Gout 04/28/2012  . Headache(784.0)    CT head 08/2011: Periventricular and subcortical white matter hypodensities are most in keeping with chronic microangiopathic change  . HIV infection [facility]) Nov 2012   Followed by [name]  . Hyperlipidemia   . Hypertension   . Pulmonary embolism [facility])     Encounter Details:     CNP Questionnaire - 12/31/16 0900      Patient Demographics   Is this a new or existing patient? Existing   Patient is considered a/an Not Applicable   Race African-American/Black     Patient Assistance   Location of Patient Assistance [facility]   Patient's financial/insurance status Medicare;Low Income   Uninsured Patient ([facility]) No   Patient referred to apply for the following financial assistance Not Applicable   Food insecurities addressed Not Applicable   Transportation assistance No   Type of Assistance Other   Assistance securing medications Yes   Type of Assistance Other   Educational health offerings Medications     Encounter Details   Primary purpose of visit Post PCP Visit;Education/Health Concerns   Was an Emergency Department visit averted? Not Applicable   Does patient have a medical provider? Yes   Patient referred to Follow up with established PCP   Was a mental health screening completed? (GAINS tool) No   Does patient have dental issues? No   Does patient have vision issues? Yes   Was a vision referral made? No   Does your patient have an abnormal blood pressure today? No   Since previous encounter, have you referred patient for abnormal blood pressure that resulted in a new diagnosis or medication change? No   Does your patient have an abnormal blood glucose today? Yes   Since previous encounter, have you referred patient for abnormal blood glucose that resulted in a new diagnosis or medication change? No   Was there a life-saving intervention made? No         Clinical Intake - 12/26/16 1038      Pre-visit preparation   Pre-visit preparation completed Yes     Abuse/Neglect   Do you feel unsafe in your current relationship? No   Do you feel physically threatened by others? No   Anyone hurting you at home, work, or school? No  Unable to ask? No     Patient Literacy   How often do you need to have someone help you when you read instructions, pamphlets, or other written materials from your doctor or pharmacy? 1 - Never   What is the last grade level you completed in school? 12th     CN assisted client with medication set up.

## 2017-01-14 ENCOUNTER — Other Ambulatory Visit: Payer: Self-pay

## 2017-01-14 NOTE — Patient Outreach (Signed)
[facility]) Care Management  01/14/2017  Michael [name] [birth year] [id number]  Received InBasket notification that patient was discharged from the hospital ED.  Received email from [place] at [facility] that patient lost his bed at [facility] last week when he went to the ED.  No contact information is available for patient.  Patient was closed for [facility] services on 01-10-17 due to inability to establish contact with patient who had been admitted to [facility] on 01-09-17 with suicidal ideation.  [name], RN, MSN [place] [phone] Fax [phone]

## 2017-01-15 ENCOUNTER — Telehealth (INDEPENDENT_AMBULATORY_CARE_PROVIDER_SITE_OTHER): Payer: Self-pay | Admitting: Physician Assistant

## 2017-01-15 NOTE — Telephone Encounter (Signed)
Routed to PCP for advising. [name], CMA

## 2017-01-15 NOTE — Telephone Encounter (Signed)
Patient informed. Asked patient to schedule a follow up visit, he declined. He was very upset that the letter he requested he would not be written, and stated he will be finding another doctor. [name], CMA

## 2017-01-15 NOTE — Telephone Encounter (Signed)
Patient came in stated [name] treatment center accepted him and needs letter stating that he can go 1 month without insulin Please follow up with patient at ph: [phone]

## 2017-01-15 NOTE — Telephone Encounter (Signed)
Pt is uncontrolled diabetic, I don't recommend that he get off insulin at this point. I have yet to have a f/u with patient regarding his glucose.

## 2017-01-17 ENCOUNTER — Other Ambulatory Visit (INDEPENDENT_AMBULATORY_CARE_PROVIDER_SITE_OTHER): Payer: Self-pay | Admitting: Physician Assistant

## 2017-01-17 DIAGNOSIS — E119 Type 2 diabetes mellitus without complications: Secondary | ICD-10-CM

## 2017-01-24 ENCOUNTER — Other Ambulatory Visit (INDEPENDENT_AMBULATORY_CARE_PROVIDER_SITE_OTHER): Payer: Self-pay | Admitting: Physician Assistant

## 2017-01-24 NOTE — Telephone Encounter (Signed)
Routed to pcp. [name], CMA

## 2017-01-24 NOTE — Telephone Encounter (Signed)
Sent to PCP. [name], CMA

## 2017-01-24 NOTE — Telephone Encounter (Signed)
Diltiazem not approved. He was not taking last visit. He was placed on HCTZ. Patient will have to return for a medication reconciliation and BP check.

## 2017-01-31 ENCOUNTER — Other Ambulatory Visit (INDEPENDENT_AMBULATORY_CARE_PROVIDER_SITE_OTHER): Payer: Self-pay | Admitting: Physician Assistant

## 2017-01-31 DIAGNOSIS — E1165 Type 2 diabetes mellitus with hyperglycemia: Principal | ICD-10-CM

## 2017-01-31 DIAGNOSIS — E119 Type 2 diabetes mellitus without complications: Secondary | ICD-10-CM

## 2017-01-31 DIAGNOSIS — E1140 Type 2 diabetes mellitus with diabetic neuropathy, unspecified: Secondary | ICD-10-CM

## 2017-01-31 DIAGNOSIS — IMO0002 Reserved for concepts with insufficient information to code with codable children: Secondary | ICD-10-CM

## 2017-01-31 MED ORDER — ACCU-CHEK SOFTCLIX LANCETS MISC: 5 refills | Status: DC

## 2017-01-31 MED ORDER — GLUCOSE BLOOD VI STRP: ORAL_STRIP | 5 refills | Status: DC

## 2017-01-31 NOTE — Progress Notes (Signed)
Refill request

## 2017-03-05 ENCOUNTER — Ambulatory Visit: Payer: Self-pay | Admitting: Infectious Diseases

## 2017-03-06 ENCOUNTER — Other Ambulatory Visit: Payer: Self-pay | Admitting: Internal Medicine

## 2017-03-06 ENCOUNTER — Emergency Department (HOSPITAL_COMMUNITY)
Admission: EM | Admit: 2017-03-06 | Discharge: 2017-03-07 | Disposition: A | Discharge status: Home / Self Care | Payer: Medicare Other | Attending: Emergency Medicine | Admitting: Emergency Medicine

## 2017-03-06 ENCOUNTER — Encounter (HOSPITAL_COMMUNITY): Payer: Self-pay

## 2017-03-06 ENCOUNTER — Other Ambulatory Visit: Payer: Self-pay | Admitting: Cardiovascular Disease

## 2017-03-06 ENCOUNTER — Emergency Department (HOSPITAL_COMMUNITY): Payer: Medicare Other

## 2017-03-06 DIAGNOSIS — I2510 Atherosclerotic heart disease of native coronary artery without angina pectoris: Secondary | ICD-10-CM | POA: Diagnosis not present

## 2017-03-06 DIAGNOSIS — Z7901 Long term (current) use of anticoagulants: Secondary | ICD-10-CM | POA: Insufficient documentation

## 2017-03-06 DIAGNOSIS — R0789 Other chest pain: Secondary | ICD-10-CM

## 2017-03-06 DIAGNOSIS — E1140 Type 2 diabetes mellitus with diabetic neuropathy, unspecified: Secondary | ICD-10-CM | POA: Diagnosis not present

## 2017-03-06 DIAGNOSIS — R0602 Shortness of breath: Secondary | ICD-10-CM | POA: Diagnosis not present

## 2017-03-06 DIAGNOSIS — I1310 Hypertensive heart and chronic kidney disease without heart failure, with stage 1 through stage 4 chronic kidney disease, or unspecified chronic kidney disease: Secondary | ICD-10-CM | POA: Diagnosis not present

## 2017-03-06 DIAGNOSIS — E1122 Type 2 diabetes mellitus with diabetic chronic kidney disease: Secondary | ICD-10-CM | POA: Insufficient documentation

## 2017-03-06 DIAGNOSIS — Z794 Long term (current) use of insulin: Secondary | ICD-10-CM | POA: Insufficient documentation

## 2017-03-06 DIAGNOSIS — J069 Acute upper respiratory infection, unspecified: Secondary | ICD-10-CM | POA: Insufficient documentation

## 2017-03-06 DIAGNOSIS — R05 Cough: Secondary | ICD-10-CM | POA: Diagnosis not present

## 2017-03-06 DIAGNOSIS — J45909 Unspecified asthma, uncomplicated: Secondary | ICD-10-CM | POA: Insufficient documentation

## 2017-03-06 DIAGNOSIS — N183 Chronic kidney disease, stage 3 (moderate): Secondary | ICD-10-CM | POA: Insufficient documentation

## 2017-03-06 DIAGNOSIS — Z79899 Other long term (current) drug therapy: Secondary | ICD-10-CM | POA: Insufficient documentation

## 2017-03-06 DIAGNOSIS — R079 Chest pain, unspecified: Secondary | ICD-10-CM | POA: Diagnosis not present

## 2017-03-06 LAB — I-STAT TROPONIN, ED: TROPONIN I, POC: 0.01 ng/mL (ref 0.00–0.08)

## 2017-03-06 LAB — BASIC METABOLIC PANEL
ANION GAP: 9 (ref 5–15)
BUN: 14 mg/dL (ref 6–20)
CALCIUM: 8.5 mg/dL — AB (ref 8.9–10.3)
CO2: 22 mmol/L (ref 22–32)
Chloride: 101 mmol/L (ref 101–111)
Creatinine, Ser: 1.98 mg/dL — ABNORMAL HIGH (ref 0.61–1.24)
GFR, EST AFRICAN AMERICAN: 39 mL/min — AB (ref >60)
GFR, EST NON AFRICAN AMERICAN: 33 mL/min — AB (ref >60)
GLUCOSE: 569 mg/dL — AB (ref 65–99)
Potassium: 3.8 mmol/L (ref 3.5–5.1)
SODIUM: 132 mmol/L — AB (ref 135–145)

## 2017-03-06 LAB — CBC
HCT: 31.8 % — ABNORMAL LOW (ref 39.0–52.0)
HEMOGLOBIN: 10.3 g/dL — AB (ref 13.0–17.0)
MCH: 26.3 pg (ref 26.0–34.0)
MCHC: 32.4 g/dL (ref 30.0–36.0)
MCV: 81.3 fL (ref 78.0–100.0)
Platelets: 132 10*3/uL — ABNORMAL LOW (ref 150–400)
RBC: 3.91 MIL/uL — ABNORMAL LOW (ref 4.22–5.81)
RDW: 14 % (ref 11.5–15.5)
WBC: 3.4 10*3/uL — AB (ref 4.0–10.5)

## 2017-03-06 LAB — TROPONIN I

## 2017-03-06 MED ORDER — SODIUM CHLORIDE 0.9 % IV BOLUS (SEPSIS)
1000.0000 mL | Freq: Once | INTRAVENOUS | Status: AC
Start: 2017-03-06 — End: 2017-03-07
  Administered 2017-03-06: 1000 mL via INTRAVENOUS

## 2017-03-06 NOTE — ED Provider Notes (Signed)
[place] DEPT Provider Note   CSN: [id number] Arrival date & time: 03/06/17  1927     History   Chief Complaint Chief Complaint  Patient presents with  . Shortness of Breath  . Chest Pain  . Cough    HPI Michael [name] is a 66 y.o. male presenting with progressive onset of cough and shortness of breath with nasal congestion and postnasal drip for the last 3 days. He also reports a left upper sharp intermittent chest pain that is not radiating. During the review of systems he mentioned that he's noticed a slight loss of balance now and again where he feels like he walks towards the left with the last episode about 2 weeks ago. He denies any recent surgery, prolonged immobilization, calf pain, leg swelling, fever, chills, nausea, vomiting, diarrhea. She is currently taking small toe and his being followed by Dr. Jillyn Hidden ID. He denies any cocaine use for the past month. He is requesting food.  HPI  Past Medical History:  Diagnosis Date  . A-fib ([place])   . Asthma    No PFTs, history of childhood asthma  . CAD (coronary artery disease)    a. 06/2013 STEMI/PCI ([facility]): LAD w/ thrombus (treated with BMS), mid 75%, D2 75%; LCX OM2 75%; RCA small, PDA 95%, PLV 95%;  b. 10/2013 Cath/PCI: ISR w/in LAD (Promus DES x 2), borderline OM2 lesion;  c. 01/2014 MV: Intermediate risk, medium-sized distal ant wall infarct w/ very small amt of peri-infarct ischemia. EF 60%.  . Cellulitis 04/2014   left facial  . Chondromalacia of medial femoral condyle    Left knee MRI 04/28/12: Chondromalacia of the medial femoral condyle with slight peripheral degeneration of the meniscocapsular junction of the medial meniscus; followed by sports medicine  . Collagen vascular disease ([place])   . Crack cocaine use    for 20+ years, has been enrolled in detox programs in the past  . Depression    with history of hospitalization for suicidal ideation  . Diabetes mellitus 2002   Diagnosed in 2002, started insulin in  2012  . Gout   . Gout 04/28/2012  . Headache(784.0)    CT head 08/2011: Periventricular and subcortical white matter hypodensities are most in keeping with chronic microangiopathic change  . HIV infection [facility]) Nov 2012   Followed by [name]  . Hyperlipidemia   . Hypertension   . Pulmonary embolism [facility])     Patient Active Problem List   Diagnosis Date Noted  . Hepatitis C 12/30/2016  . Substance induced mood disorder ([place]) 11/23/2016  . Back pain 04/18/2016  . S/P carotid endarterectomy 11/15/2015  . MDD (major depressive disorder), recurrent severe, without psychosis ([place]) 09/09/2015  . Cocaine dependence with cocaine-induced mood disorder ([place])   . Cocaine-induced mood disorder ([place]) 08/14/2015  . Gout 07/10/2015  . Cough 04/26/2015  . Lactic acidosis 03/06/2015  . CKD (chronic kidney disease) stage 3, GFR 30-59 ml/min 03/06/2015  . Normocytic anemia 03/06/2015  . Hypoglycemia   . Encounter for general adult medical examination with abnormal findings 02/09/2015  . Cocaine use disorder, severe, dependence ([place]) 12/13/2014  . Substance or medication-induced depressive disorder with onset during withdrawal ([place]) 12/13/2014  . Cervicalgia 06/28/2014  . Lumbar radiculopathy, chronic 06/28/2014  . Asthma, chronic 02/03/2014  . 3-vessel CAD 06/24/2013  . ED (erectile dysfunction) of organic origin 07/07/2012  . Hypertension goal BP (blood pressure) < 140/80 04/29/2012  . Chondromalacia of left knee 03/19/2012  . Hyperlipidemia with target  LDL less than 100 02/12/2012  . Fibromyalgia 02/12/2012  . HIV (human immunodeficiency virus infection) ([place]) 08/27/2011  . Uncontrolled type 2 diabetes with neuropathy ([place]) 10/17/2000    Past Surgical History:  Procedure Laterality Date  . BACK SURGERY     1988  . BOWEL RESECTION    . CARDIAC SURGERY    . CERVICAL SPINE SURGERY     " rods in my neck "  . CORONARY STENT PLACEMENT    . NM MYOCAR PERF WALL MOTION  12/27/2011    normal  . SPINE SURGERY         Home Medications    Prior to Admission medications   Medication Sig Start Date End Date Taking? Authorizing Provider  albuterol (PROAIR HFA) 108 (90 Base) MCG/ACT inhaler Inhale 2 puffs into the lungs every 6 (six) hours as needed for wheezing or shortness of breath. 12/26/16  Yes [name], PA-C  diltiazem (CARDIZEM CD) 120 MG 24 hr capsule Take 1 capsule (120 mg total) by mouth daily. 12/26/16  Yes [name], PA-C  DULoxetine (CYMBALTA) 30 MG capsule Take 30 mg by mouth daily. 12/07/16  Yes [provider]  emtricitabine-rilpivir-tenofovir DF (COMPLERA) 200-25-300 MG tablet Take 1 tablet by mouth daily. 12/26/16  Yes [name], PA-C  famotidine (PEPCID) 20 MG tablet Take 1 tablet (20 mg total) by mouth daily. 12/26/16  Yes [name], PA-C  gabapentin (NEURONTIN) 400 MG capsule Take 1 capsule (400 mg total) by mouth 3 (three) times daily. 12/26/16  Yes [name], PA-C  glipiZIDE (GLUCOTROL XL) 5 MG 24 hr tablet Take 5 mg by mouth daily. 12/07/16  Yes [provider]  hydrochlorothiazide (HYDRODIURIL) 12.5 MG tablet Take 1 tablet (12.5 mg total) by mouth daily. 12/26/16  Yes [name], PA-C  insulin glargine (LANTUS) 100 unit/mL SOPN Inject 0.25 mLs (25 Units total) into the skin at bedtime. 12/26/16  Yes [name], PA-C  insulin lispro (HUMALOG) 100 UNIT/ML injection Inject 0.05 mLs (5 Units total) into the skin 3 (three) times daily before meals. Sliding scale as directed. 12/30/16  Yes [name], PA-C  isosorbide mononitrate (IMDUR) 30 MG 24 hr tablet Take 1 tablet (30 mg total) by mouth daily. NEED OV. 03/06/17  Yes Croitoru, Mihai, MD  losartan (COZAAR) 50 MG tablet Take 1 tablet (50 mg total) by mouth daily. 12/26/16  Yes [name], PA-C  metFORMIN (GLUCOPHAGE) 500 MG tablet Take 2 tablets (1,000 mg total) by mouth 2 (two) times daily before a meal. 12/26/16  Yes [name], PA-C  pravastatin (PRAVACHOL) 40 MG tablet Take 1 tablet (40 mg total) by mouth at bedtime. 12/26/16  Yes [name], PA-C  Rivaroxaban (XARELTO) 15 MG TABS tablet Take 15 mg by mouth daily.   Yes [provider]  sertraline (ZOLOFT) 50 MG tablet Take 1 tablet (50 mg total) by mouth daily. 01/10/17  Yes Patrecia Pour, NP  ACCU-CHEK SOFTCLIX LANCETS lancets USE TO CHECK BLOOD GLUCOSE LEVELS UP TO 4 TIMES DAILY AS DIRECTED 01/31/17   [name], PA-C  benzonatate (TESSALON) 100 MG capsule Take 1 capsule (100 mg total) by mouth every 8 (eight) hours. 03/07/17   Avie Echevaria B, PA-C  blood glucose meter kit and supplies KIT Dispense based on patient and insurance preference. Use up to four times daily as directed. (FOR ICD-9 250.00, 250.01). 12/26/16   [name], PA-C  glucose blood (ACCU-CHEK  AVIVA PLUS) test strip USE TO CHECK BLOOD GLUCOSE LEVELS UP TO 4 TIMES DAILY AS DIRECTED 01/31/17   [name], PA-C  hydrocortisone cream 1 % Apply to affected area 2 times daily Patient not taking: Reported on 01/09/2017 01/06/17   Volanda Napoleon, PA-C  Insulin Syringe-Needle U-100 (B-D INS SYR MICROFINE 1CC/27G) 27G X 5/8" 1 ML MISC 1 strip by Does not apply route 3 (three) times daily as needed. 12/26/16   [name], PA-C  Rivaroxaban 15 & 20 MG TBPK Take as directed on package: Start with one 73m tablet by mouth twice a day with food. On Day 22, switch to one 279mtablet once a day with food. Patient not taking: Reported on 03/07/2017 12/26/16   GoClent DemarkPA-C  sertraline (ZOLOFT) 25 MG tablet Take 1 tablet (25 mg total) by mouth daily. Patient not taking: Reported on 03/07/2017 12/26/16   GoClent DemarkPA-C    Family History Family History  Problem Relation Age of Onset  . Diabetes Mother   . Hypertension Mother   . Hyperlipidemia Mother   . Diabetes Father   . Cancer Father   . Hypertension Father   . Diabetes Brother   .  Heart disease Brother   . Diabetes Sister   . Colon cancer Neg Hx     Social History Social History  Substance Use Topics  . Smoking status: Never Smoker  . Smokeless tobacco: Never Used  . Alcohol use No     Allergies   Patient has no known allergies.   Review of Systems Review of Systems  Constitutional: Negative for chills and fever.  HENT: Positive for congestion and postnasal drip. Negative for ear pain and sore throat.   Eyes: Negative for pain and visual disturbance.  Respiratory: Positive for cough and shortness of breath. Negative for wheezing and stridor.   Cardiovascular: Positive for chest pain. Negative for palpitations and leg swelling.  Gastrointestinal: Negative for abdominal distention, abdominal pain, blood in stool, nausea and vomiting.  Genitourinary: Negative for difficulty urinating, dysuria, flank pain and hematuria.  Musculoskeletal: Positive for arthralgias, gait problem and neck pain. Negative for back pain, joint swelling and neck stiffness.       Chronic left knee pain and stiffness. Chronic neck pain from prior surgeries.  Skin: Negative for color change, pallor and rash.  Neurological: Negative for dizziness, seizures, syncope, facial asymmetry, weakness, numbness and headaches.     Physical Exam Updated Vital Signs BP 140/86   Pulse 80   Temp 98.6 F (37 C) (Oral)   Resp (!) 21   SpO2 99%   Physical Exam  Constitutional: He is oriented to person, place, and time. He appears well-developed and well-nourished. No distress.  Patient is afebrile, nontoxic-appearing, lying comfortably in bed in no acute distress.  HENT:  Head: Normocephalic and atraumatic.  Mouth/Throat: Oropharynx is clear and moist. No oropharyngeal exudate.  Eyes: Conjunctivae and EOM are normal. Pupils are equal, round, and reactive to light. Right eye exhibits no discharge. Left eye exhibits no discharge.  Neck: Normal range of motion. Neck supple. No JVD present.    Cardiovascular: Normal rate, normal heart sounds and intact distal pulses.   No murmur heard. Pulmonary/Chest: Effort normal and breath sounds normal. No stridor. No respiratory distress. He has no wheezes. He has no rales. He exhibits no tenderness.  Abdominal: Soft. He exhibits no distension and no mass. There is no tenderness. There is no rebound and no  guarding.  Musculoskeletal: Normal range of motion. He exhibits tenderness. He exhibits no edema or deformity.  He is TTP of left trapezius and shoulder area. He reports that this is baseline for him and he doesn't have any new chest pain.  Lymphadenopathy:    He has no cervical adenopathy.  Neurological: He is alert and oriented to person, place, and time. He displays normal reflexes. No cranial nerve deficit or sensory deficit. He exhibits normal muscle tone. Coordination normal.  Neurologic Exam:  - Mental status: Patient is alert and cooperative. Fluent speech and words are clear. Coherent thought processes and insight is good. Patient is oriented x 4 to person, place, time and event.  - Cranial nerves:  CN III, IV, VI: pupils equally round, reactive to light both direct and conscensual and normal accommodation. Full extra-ocular movement. CN VII : muscles of facial expression intact. CN X :  midline uvula. XI strength of sternocleidomastoid and trapezius muscles 5/5, XII: tongue is midline when protruded. - Motor: No involuntary movements. Muscle tone and bulk normal throughout. Muscle strength is 5/5 in bilateral shoulder abduction, elbow flexion and extension, grip, hip extension, flexion, leg flexion and extension, ankle dorsiflexion and plantar flexion.  - Sensory: Proprioception, light tough sensation intact in all extremities.  - Reflexes: biceps, brachioradialis, patellar,  2 + and equal bilaterally;  - Cerebellar: rapid alternating movements and point to point movement intact in upper and lower extremities. Normal stance and gait.    Skin: Skin is warm and dry. No rash noted. He is not diaphoretic. No erythema. No pallor.  Psychiatric: He has a normal mood and affect.  Nursing note and vitals reviewed.    ED Treatments / Results  Labs (all labs ordered are listed, but only abnormal results are displayed) Labs Reviewed  BASIC METABOLIC PANEL - Abnormal; Notable for the following:       Result Value   Sodium 132 (*)    Glucose, Bld 569 (*)    Creatinine, Ser 1.98 (*)    Calcium 8.5 (*)    GFR calc non Af Amer 33 (*)    GFR calc Af Amer 39 (*)    All other components within normal limits  CBC - Abnormal; Notable for the following:    WBC 3.4 (*)    RBC 3.91 (*)    Hemoglobin 10.3 (*)    HCT 31.8 (*)    Platelets 132 (*)    All other components within normal limits  TROPONIN I  I-STAT TROPOININ, ED  I-STAT TROPOININ, ED  CBG MONITORING, ED    EKG  EKG Interpretation  Date/Time:  Thursday Mar 06 2017 19:33:14 EDT Ventricular Rate:  101 PR Interval:  146 QRS Duration: 88 QT Interval:  334 QTC Calculation: 433 R Axis:   53 Text Interpretation:  Sinus tachycardia with Premature atrial complexes new Nonspecific T wave abnormality Confirmed by Blanchie Dessert [phone]) on 03/06/2017 10:03:50 PM       Radiology Dg Chest 2 View  Result Date: 03/06/2017 CLINICAL DATA:  Shortness of breath with chest pain and cough for 3 days. EXAM: CHEST  2 VIEW COMPARISON:  12/19/2016 FINDINGS: The lungs are clear wiithout focal pneumonia, edema, pneumothorax or pleural effusion. The cardiopericardial silhouette is within normal limits for size. Coronary stent device noted. Nodular density/densities projecting over the lungs are compatible with pads for telemetry leads. Extensive cervical fusion hardware evident. The visualized bony structures of the thorax are intact. IMPRESSION: Stable.  No acute cardiopulmonary findings.  Electronically Signed   By: Misty Stanley M.D.   On: 03/06/2017 20:55    Procedures Procedures  (including critical care time)  Medications Ordered in ED Medications  sodium chloride 0.9 % bolus 1,000 mL (1,000 mLs Intravenous New Bag/Given 03/06/17 2251)     Initial Impression / Assessment and Plan / ED Course  I have reviewed the triage vital signs and the nursing notes.  Pertinent labs & imaging results that were available during my care of the patient were reviewed by me and considered in my medical decision making (see chart for details).     Patient presents with 3 days of nasal congestion, cough and shortness of breath also reports that he's been losing his balance intermittently for many weeks.  Reassuring exam, normal neuro. Normal stance and gait. Lungs are clear and equal to auscultation bilaterally. Patient is in no distress and speaking in full sentences. Patient is afebrile nontoxic appearing. CXR negative.   Delta troponin negative. Repeat EKG unchanged. Low suspicion for cardiac etiology of symptoms at this time. Patient was observed for multiple hours while in the emergency department and was stable.  Noted elevated BG and given IV fluids, patient reported non-compliance with his insulin today as he doesn't like to "stick" himself . Provided patient with education on the importance of compliance with his insulin and potential consequences of noncompliance. No anion gap.  Blood glucose improved down to 300s after fluids which is his baseline. Patient was well-appearing and reported improvement and asked for a sandwich prior to discharge.   We'll discharge home with close follow-up with PCP. Advised patient to follow up with his primary care provider to discuss management of diabetes and insulin injections. Also advised to stay well-hydrated.  Patient was discussed with Dr. Maryan Rued who agrees with assessment and plan.  Discussed strict return precautions and advised to return to the emergency department if experiencing any new or worsening symptoms. Instructions were  understood and patient agreed with discharge plan. Final Clinical Impressions(s) / ED Diagnoses   Final diagnoses:  Shortness of breath  Upper respiratory tract infection, unspecified type  Other chest pain    New Prescriptions New Prescriptions   BENZONATATE (TESSALON) 100 MG CAPSULE    Take 1 capsule (100 mg total) by mouth every 8 (eight) hours.     Emeline General, PA-C 03/07/17 [id number]    Blanchie Dessert, MD 03/07/17 [phone]

## 2017-03-06 NOTE — ED Triage Notes (Signed)
Pt states he started having SOB, chest pain and cough x 3 days ago; Pt c/o left chest pain, neck and back pain at 9/10 on arrival. Pt a&ox 4 on arrival. Pt able to speak in full sentences at triage; Pt states last used cocaine about 3 days ago. Pt states he has stents placed about 3 years ago.

## 2017-03-06 NOTE — Telephone Encounter (Signed)
REFILL 

## 2017-03-07 ENCOUNTER — Other Ambulatory Visit: Payer: Self-pay

## 2017-03-07 LAB — CBG MONITORING, ED: Glucose-Capillary: 380 mg/dL — ABNORMAL HIGH (ref 65–99)

## 2017-03-07 MED ORDER — BENZONATATE 100 MG PO CAPS: 100.0000 mg | ORAL_CAPSULE | Freq: Three times a day (TID) | ORAL | 0 refills | Status: DC

## 2017-03-07 NOTE — ED Notes (Signed)
CBG 380(not crossing over) [employer] notified

## 2017-03-07 NOTE — Discharge Instructions (Signed)
As discussed, please follow-up with your primary care provider to discuss options for management of your diabetes and insulin. It is very important that you are compliant with your insulin injection as we discussed. Stay well-hydrated keeping a urine clear. Get some rest and use cough drops and tea with honey to sooth your throat and minimize coughing.  Return to the ER if you experience worsening chest pain, shortness of breath, or any other new concerning symptoms in the meantime.

## 2017-03-10 ENCOUNTER — Other Ambulatory Visit: Payer: Self-pay | Admitting: Internal Medicine

## 2017-03-18 ENCOUNTER — Other Ambulatory Visit: Payer: Self-pay | Admitting: Internal Medicine

## 2017-03-19 ENCOUNTER — Telehealth: Payer: Self-pay | Admitting: *Deleted

## 2017-03-19 DIAGNOSIS — Z9114 Patient's other noncompliance with medication regimen: Secondary | ICD-10-CM

## 2017-03-19 DIAGNOSIS — B20 Human immunodeficiency virus [HIV] disease: Secondary | ICD-10-CM

## 2017-03-19 NOTE — Telephone Encounter (Signed)
RN received a referral from [place] stating she is concerned that Mr Garner may be out of care. Referral order placed for Dr Algis Downs approval

## 2017-03-22 DIAGNOSIS — J45909 Unspecified asthma, uncomplicated: Secondary | ICD-10-CM | POA: Diagnosis not present

## 2017-03-22 DIAGNOSIS — R079 Chest pain, unspecified: Secondary | ICD-10-CM | POA: Diagnosis not present

## 2017-03-22 DIAGNOSIS — R0602 Shortness of breath: Secondary | ICD-10-CM | POA: Diagnosis not present

## 2017-03-22 DIAGNOSIS — E1165 Type 2 diabetes mellitus with hyperglycemia: Secondary | ICD-10-CM | POA: Diagnosis not present

## 2017-03-22 DIAGNOSIS — R531 Weakness: Secondary | ICD-10-CM | POA: Diagnosis not present

## 2017-03-22 DIAGNOSIS — R05 Cough: Secondary | ICD-10-CM | POA: Diagnosis not present

## 2017-04-01 DIAGNOSIS — Z7951 Long term (current) use of inhaled steroids: Secondary | ICD-10-CM | POA: Diagnosis not present

## 2017-04-01 DIAGNOSIS — I499 Cardiac arrhythmia, unspecified: Secondary | ICD-10-CM | POA: Diagnosis not present

## 2017-04-01 DIAGNOSIS — M545 Low back pain: Secondary | ICD-10-CM | POA: Diagnosis not present

## 2017-04-01 DIAGNOSIS — R072 Precordial pain: Secondary | ICD-10-CM | POA: Diagnosis not present

## 2017-04-01 DIAGNOSIS — I509 Heart failure, unspecified: Secondary | ICD-10-CM | POA: Insufficient documentation

## 2017-04-01 DIAGNOSIS — J449 Chronic obstructive pulmonary disease, unspecified: Secondary | ICD-10-CM | POA: Diagnosis not present

## 2017-04-01 DIAGNOSIS — E785 Hyperlipidemia, unspecified: Secondary | ICD-10-CM | POA: Diagnosis not present

## 2017-04-01 DIAGNOSIS — Z9889 Other specified postprocedural states: Secondary | ICD-10-CM | POA: Diagnosis not present

## 2017-04-01 DIAGNOSIS — R079 Chest pain, unspecified: Secondary | ICD-10-CM | POA: Diagnosis not present

## 2017-04-01 DIAGNOSIS — I130 Hypertensive heart and chronic kidney disease with heart failure and stage 1 through stage 4 chronic kidney disease, or unspecified chronic kidney disease: Secondary | ICD-10-CM | POA: Diagnosis not present

## 2017-04-01 DIAGNOSIS — I2510 Atherosclerotic heart disease of native coronary artery without angina pectoris: Secondary | ICD-10-CM | POA: Diagnosis not present

## 2017-04-01 DIAGNOSIS — Z794 Long term (current) use of insulin: Secondary | ICD-10-CM | POA: Diagnosis not present

## 2017-04-01 DIAGNOSIS — E109 Type 1 diabetes mellitus without complications: Secondary | ICD-10-CM | POA: Diagnosis not present

## 2017-04-01 DIAGNOSIS — R0600 Dyspnea, unspecified: Secondary | ICD-10-CM | POA: Diagnosis not present

## 2017-04-01 DIAGNOSIS — Z955 Presence of coronary angioplasty implant and graft: Secondary | ICD-10-CM | POA: Diagnosis not present

## 2017-04-01 DIAGNOSIS — D649 Anemia, unspecified: Secondary | ICD-10-CM | POA: Diagnosis not present

## 2017-04-01 DIAGNOSIS — I10 Essential (primary) hypertension: Secondary | ICD-10-CM | POA: Diagnosis not present

## 2017-04-01 DIAGNOSIS — R531 Weakness: Secondary | ICD-10-CM | POA: Diagnosis not present

## 2017-04-01 DIAGNOSIS — R0602 Shortness of breath: Secondary | ICD-10-CM | POA: Diagnosis not present

## 2017-04-01 DIAGNOSIS — Z7982 Long term (current) use of aspirin: Secondary | ICD-10-CM | POA: Diagnosis not present

## 2017-04-01 DIAGNOSIS — E1122 Type 2 diabetes mellitus with diabetic chronic kidney disease: Secondary | ICD-10-CM | POA: Diagnosis not present

## 2017-04-01 DIAGNOSIS — N183 Chronic kidney disease, stage 3 (moderate): Secondary | ICD-10-CM | POA: Diagnosis not present

## 2017-04-01 DIAGNOSIS — Z9119 Patient's noncompliance with other medical treatment and regimen: Secondary | ICD-10-CM | POA: Diagnosis not present

## 2017-04-02 DIAGNOSIS — N183 Chronic kidney disease, stage 3 (moderate): Secondary | ICD-10-CM | POA: Diagnosis not present

## 2017-04-02 DIAGNOSIS — I509 Heart failure, unspecified: Secondary | ICD-10-CM | POA: Diagnosis not present

## 2017-04-02 DIAGNOSIS — R079 Chest pain, unspecified: Secondary | ICD-10-CM | POA: Diagnosis not present

## 2017-04-02 DIAGNOSIS — J449 Chronic obstructive pulmonary disease, unspecified: Secondary | ICD-10-CM | POA: Diagnosis not present

## 2017-04-03 DIAGNOSIS — R079 Chest pain, unspecified: Secondary | ICD-10-CM | POA: Diagnosis not present

## 2017-04-03 DIAGNOSIS — I509 Heart failure, unspecified: Secondary | ICD-10-CM | POA: Diagnosis not present

## 2017-04-03 DIAGNOSIS — J449 Chronic obstructive pulmonary disease, unspecified: Secondary | ICD-10-CM | POA: Diagnosis not present

## 2017-04-03 DIAGNOSIS — N183 Chronic kidney disease, stage 3 (moderate): Secondary | ICD-10-CM | POA: Diagnosis not present

## 2017-04-09 DIAGNOSIS — N2889 Other specified disorders of kidney and ureter: Secondary | ICD-10-CM | POA: Diagnosis not present

## 2017-04-09 DIAGNOSIS — E1165 Type 2 diabetes mellitus with hyperglycemia: Secondary | ICD-10-CM | POA: Diagnosis not present

## 2017-04-09 DIAGNOSIS — N289 Disorder of kidney and ureter, unspecified: Secondary | ICD-10-CM | POA: Diagnosis not present

## 2017-04-10 DIAGNOSIS — E1140 Type 2 diabetes mellitus with diabetic neuropathy, unspecified: Secondary | ICD-10-CM | POA: Diagnosis not present

## 2017-04-10 DIAGNOSIS — E1165 Type 2 diabetes mellitus with hyperglycemia: Secondary | ICD-10-CM | POA: Diagnosis not present

## 2017-04-18 DIAGNOSIS — E785 Hyperlipidemia, unspecified: Secondary | ICD-10-CM | POA: Diagnosis not present

## 2017-04-18 DIAGNOSIS — M545 Low back pain: Secondary | ICD-10-CM | POA: Diagnosis not present

## 2017-04-18 DIAGNOSIS — I10 Essential (primary) hypertension: Secondary | ICD-10-CM | POA: Diagnosis not present

## 2017-04-18 DIAGNOSIS — R109 Unspecified abdominal pain: Secondary | ICD-10-CM | POA: Diagnosis not present

## 2017-04-18 DIAGNOSIS — N390 Urinary tract infection, site not specified: Secondary | ICD-10-CM | POA: Diagnosis not present

## 2017-04-18 DIAGNOSIS — M1990 Unspecified osteoarthritis, unspecified site: Secondary | ICD-10-CM | POA: Diagnosis not present

## 2017-04-18 DIAGNOSIS — K5900 Constipation, unspecified: Secondary | ICD-10-CM | POA: Diagnosis not present

## 2017-04-18 DIAGNOSIS — I2510 Atherosclerotic heart disease of native coronary artery without angina pectoris: Secondary | ICD-10-CM | POA: Diagnosis not present

## 2017-04-18 DIAGNOSIS — R339 Retention of urine, unspecified: Secondary | ICD-10-CM | POA: Diagnosis not present

## 2017-04-18 DIAGNOSIS — E1165 Type 2 diabetes mellitus with hyperglycemia: Secondary | ICD-10-CM | POA: Diagnosis not present

## 2017-04-19 ENCOUNTER — Other Ambulatory Visit (INDEPENDENT_AMBULATORY_CARE_PROVIDER_SITE_OTHER): Payer: Self-pay | Admitting: Physician Assistant

## 2017-04-21 IMAGING — MR MR LUMBAR SPINE W/O CM
4 of 5 series · 18 of 48 positions shown · non-contrast
Comparison: 09/21/2015 CT abdomen pelvis. 09/05/2015 lumbar spine
MR.

CLINICAL DATA: 64-year-old male with moderate bilateral leg/hip
pain onset today. Chronic lower back pain. Initial encounter.

EXAM:
MRI LUMBAR SPINE WITHOUT CONTRAST
TECHNIQUE: Multiplanar, multisequence MR imaging of the lumbar spine was
performed. No intravenous contrast was administered.

[Series 3: T2 · sagittal · 4.0mm · 0.55mm/px · 6 of 12 slices shown (1 of 2)]
[im 1/12]
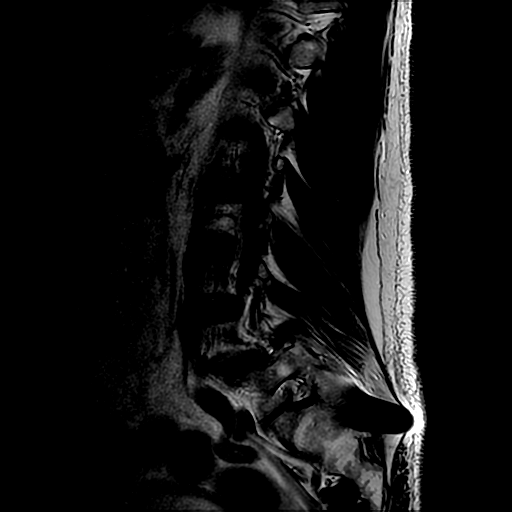
[im 3/12]
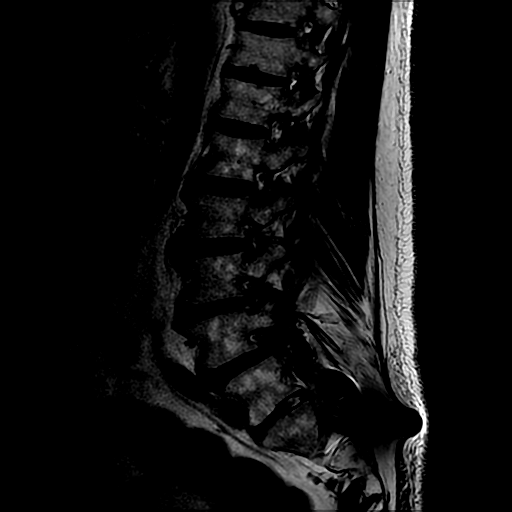
[im 5/12]
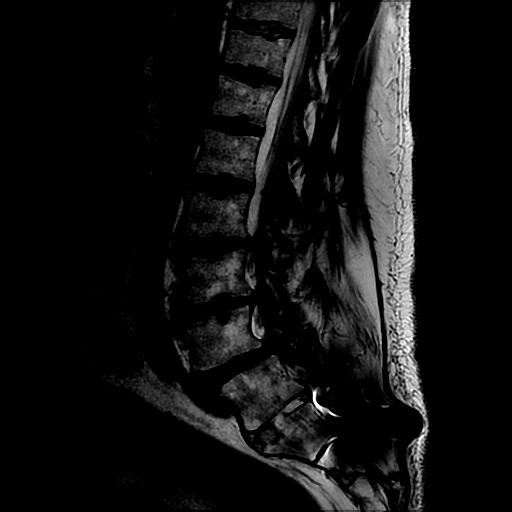
[im 7/12]
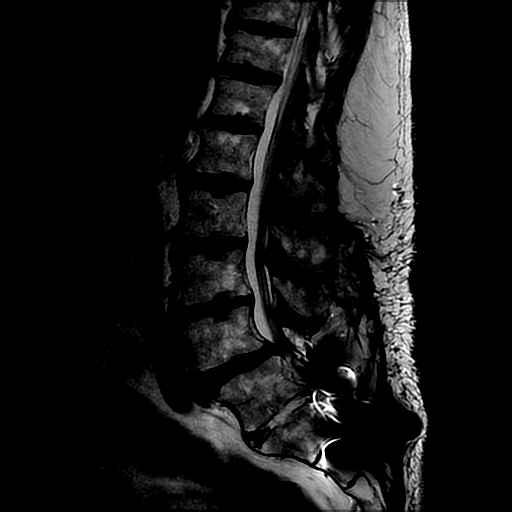
[im 9/12]
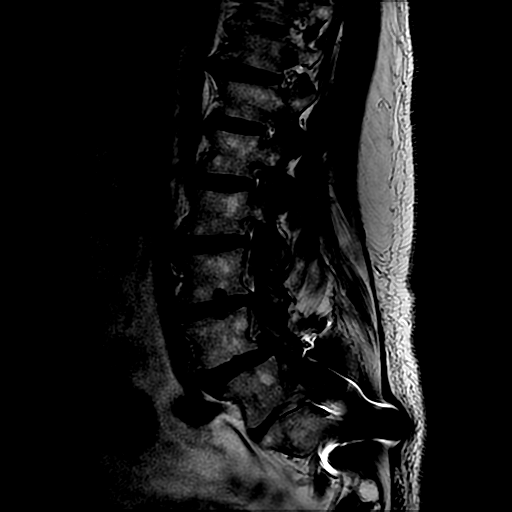
[im 12/12]
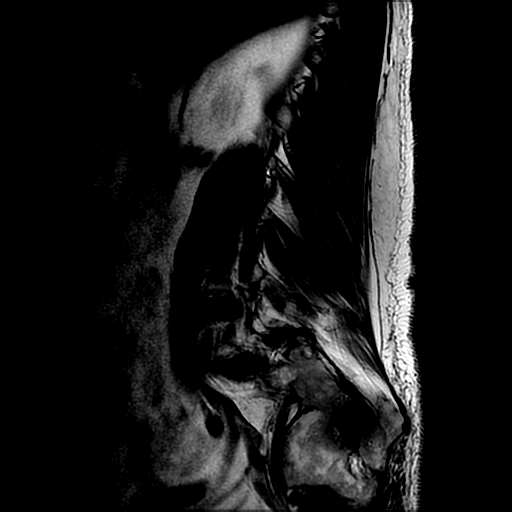

[Series 4: T1 · sagittal · 4.0mm · 0.55mm/px · 3 of 12 slices shown (1 of 2)]
[im 3/12]
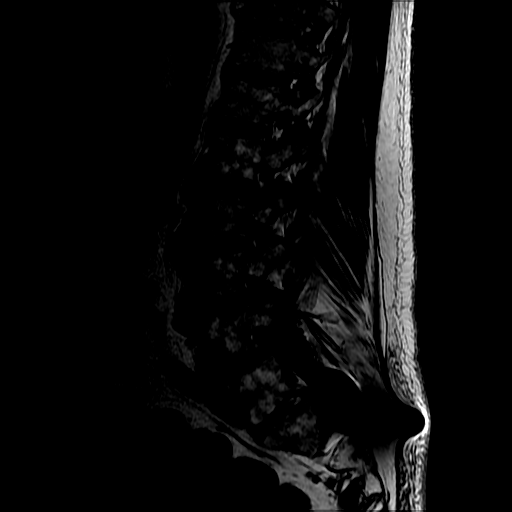
[im 7/12]
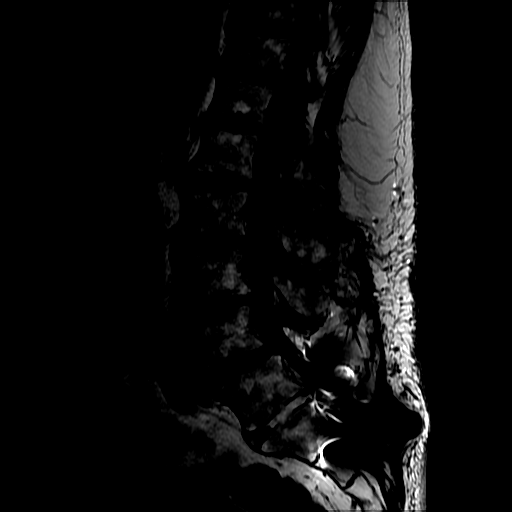
[im 12/12]
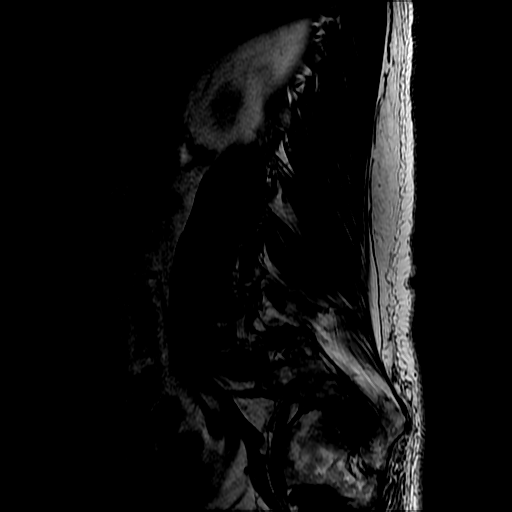

[Series 6: T2 · axial · 4.0mm · 0.39mm/px · z∈[-123,+38]mm · 6 of 32 slices shown (2 of 2)]
[im 1/32]
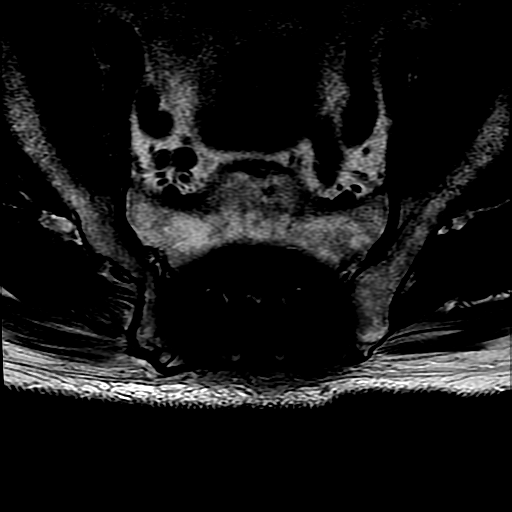
[im 5/32]
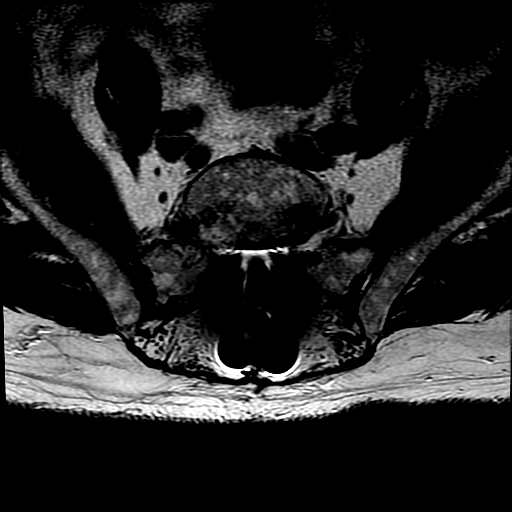
[im 9/32]
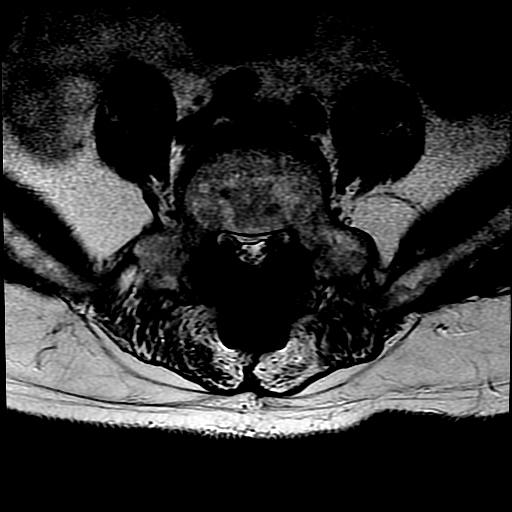
[im 14/32]
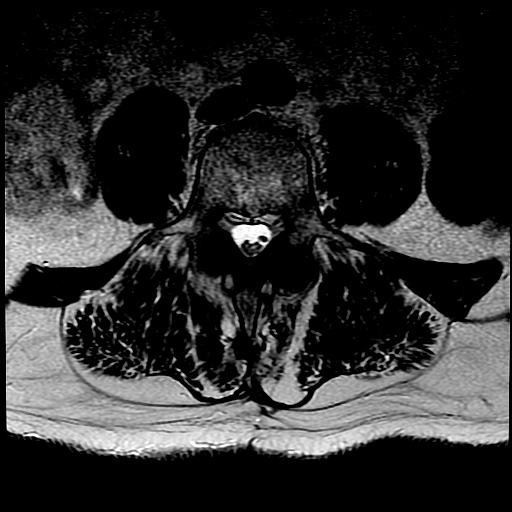
[im 16/32]
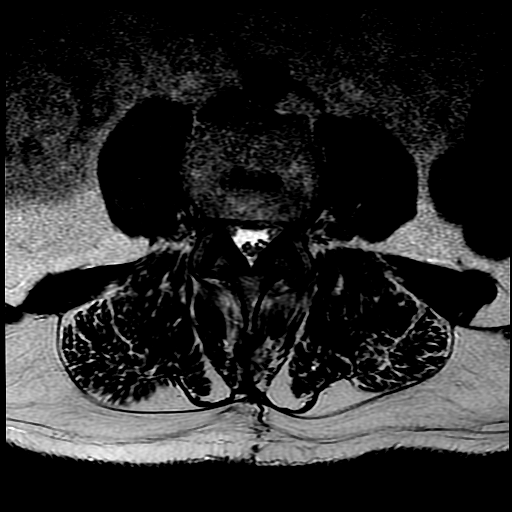
[im 27/32]
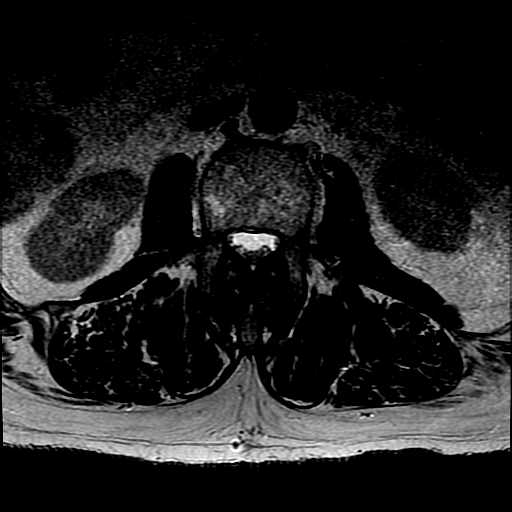

[Series 7: T1 · axial · 4.0mm · 0.39mm/px · z∈[-104,+38]mm · 3 of 32 slices shown (2 of 2)]
[im 5/32]
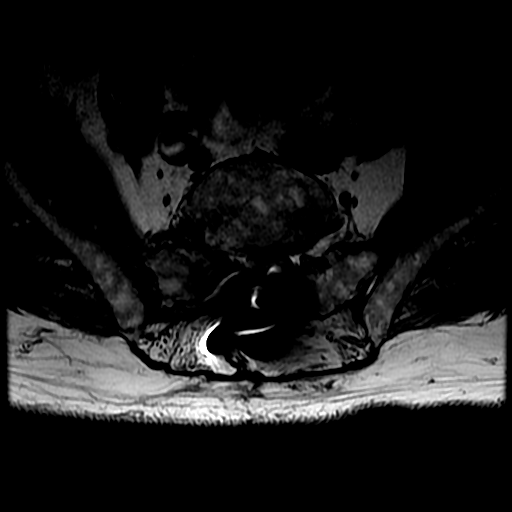
[im 16/32]
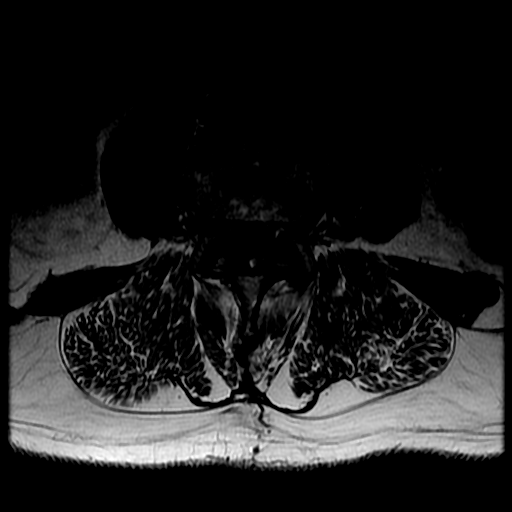
[im 27/32]
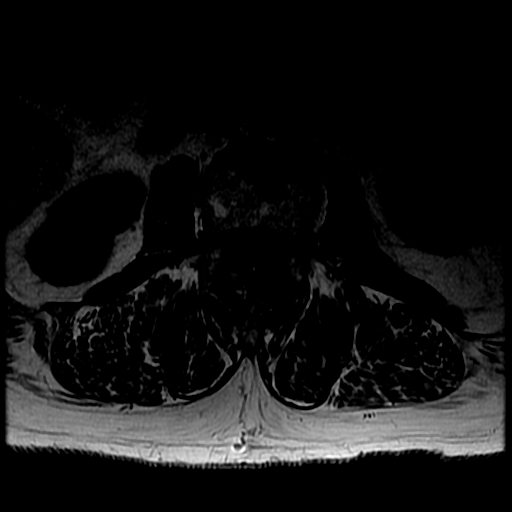

[18 of 48 positions shown; findings below may reference images not displayed]

FINDINGS: Segmentation: Last fully open disk space is labeled L5-S1. Present
examination incorporates from T11-12 disc space through the mid
lower sacrum.

Alignment:  4.6 mm anterior slip L4.

Vertebrae: Scattered small Schmorl's node deformities. Slightly
heterogeneous bone marrow without worrisome lesion.

Conus medullaris: Extends to the just below the T12-L1 disc space
level and appears normal.

Paraspinal and other soft tissues: No worrisome abnormality.

Disc levels:

T10-11:  Small Schmorl's node deformity.

T11-12: Minimal Schmorl's node deformity.

T12-L1: Small Schmorl's node deformity. Mild facet degenerative
changes.

L1-2: Small Schmorl's node deformity. Minimal bulge. Minimal facet
degenerative changes. Short pedicles. Mild to slightly moderate
right-sided and mild left-sided foraminal narrowing. No significant
spinal stenosis.

L2-3: Schmorl's node deformity. Facet degenerative changes greater
on left. Mild ligamentum flavum hypertrophy. Bulge slightly greater
left foraminal position. Mild to moderate foraminal narrowing
greater on the left. No significant spinal stenosis.

L3-4: Schmorl's node deformity. Bulge. Osteophyte. Facet
degenerative changes. Mild ligamentum flavum hypertrophy.
Multifactorial moderate left-sided and mild right-sided lateral
recess stenosis. Mild to moderate bilateral foraminal narrowing.
Very mild thecal sac narrowing.

L4-5: Prominent bilateral facet degenerative changes and bony
overgrowth. Ligamentum flavum hypertrophy. 4.6 mm anterior slip L4.
Bulge/protrusion with cephalad and foraminal extension. Short
pedicles. Multifactorial severe spinal stenosis/bilateral lateral
recess stenosis and marked foraminal narrowing greater on the right.

L5-S1: Post surgery with metallic artifact arising from posterior
elements limiting evaluation without obvious foraminal narrowing or
spinal stenosis.
IMPRESSION: Similar appearance to prior exam with most notable finding the L4-5
multifactorial severe spinal stenosis/ bilateral lateral recess
stenosis and marked bilateral foraminal narrowing. Please see above
for further detail.

## 2017-04-21 NOTE — Telephone Encounter (Signed)
FWD to PCP. Tempestt S Roberts, CMA  

## 2017-04-25 ENCOUNTER — Telehealth: Payer: Self-pay | Admitting: *Deleted

## 2017-04-25 ENCOUNTER — Other Ambulatory Visit: Payer: Self-pay | Admitting: Infectious Diseases

## 2017-04-25 DIAGNOSIS — B20 Human immunodeficiency virus [HIV] disease: Secondary | ICD-10-CM

## 2017-04-25 MED ORDER — EMTRICITAB-RILPIVIR-TENOFOV AF 200-25-25 MG PO TABS: 1.0000 | ORAL_TABLET | Freq: Every day | ORAL | 2 refills | Status: DC

## 2017-04-25 NOTE — Telephone Encounter (Signed)
Received refill request from [facility].  Patient has been out of care.  RN called pharmacy for more information. Patient is homeless, has been in/out of mental health facilities.  His Complera has been refilled by different providers to keep him on medication. Most recent fill history is 4/6 months (January, March, April, June).  RN spoke with [name]. Per verbal order, will discontinue Complera and prescribe Odefsey with 2 refills.  RN asked pharmacist to please direct patient to [facility] to reengage in care. Will 'cc [facility] as well. Landis Gandy, RN

## 2017-04-28 ENCOUNTER — Telehealth: Payer: Self-pay | Admitting: *Deleted

## 2017-04-28 NOTE — Telephone Encounter (Signed)
Sam did not know where the patient was staying, said he did not have a phone number - he has never been able to get in touch with the patient or been able to set up delivery. He walks in to pick up medication.

## 2017-04-28 NOTE — Telephone Encounter (Signed)
Sharyn Lull, did he say anything about being reconnected to the [name]? He did not have a working phone in the past and I would like to connect with him.

## 2017-04-28 NOTE — Telephone Encounter (Signed)
Requesting an appointment to talk about medications.  RN gave the patient an appointment for Fri., July 27 at 9:30 AM with the pharmacist to discuss medications.  It has been over a year since the patient has been seen.  RN also, scheduled the patient to see [name] on 07/09/17 at 10:15 AM.  Patient verbalized understanding of the appointment on Friday, July 27.

## 2017-04-29 NOTE — Telephone Encounter (Signed)
I will plan to meet him on the 27th during his pharmacy appt. Thanks [employer]

## 2017-05-02 ENCOUNTER — Ambulatory Visit: Payer: Self-pay

## 2017-05-02 ENCOUNTER — Other Ambulatory Visit: Payer: Self-pay | Admitting: *Deleted

## 2017-05-10 DIAGNOSIS — E785 Hyperlipidemia, unspecified: Secondary | ICD-10-CM | POA: Diagnosis not present

## 2017-05-10 DIAGNOSIS — N289 Disorder of kidney and ureter, unspecified: Secondary | ICD-10-CM | POA: Diagnosis not present

## 2017-05-10 DIAGNOSIS — E860 Dehydration: Secondary | ICD-10-CM | POA: Diagnosis not present

## 2017-05-10 DIAGNOSIS — Z79899 Other long term (current) drug therapy: Secondary | ICD-10-CM | POA: Diagnosis not present

## 2017-05-10 DIAGNOSIS — Z7982 Long term (current) use of aspirin: Secondary | ICD-10-CM | POA: Diagnosis not present

## 2017-05-10 DIAGNOSIS — Z955 Presence of coronary angioplasty implant and graft: Secondary | ICD-10-CM | POA: Diagnosis not present

## 2017-05-10 DIAGNOSIS — D649 Anemia, unspecified: Secondary | ICD-10-CM | POA: Diagnosis not present

## 2017-05-10 DIAGNOSIS — Z7901 Long term (current) use of anticoagulants: Secondary | ICD-10-CM | POA: Diagnosis not present

## 2017-05-10 DIAGNOSIS — I2510 Atherosclerotic heart disease of native coronary artery without angina pectoris: Secondary | ICD-10-CM | POA: Diagnosis not present

## 2017-05-10 DIAGNOSIS — I129 Hypertensive chronic kidney disease with stage 1 through stage 4 chronic kidney disease, or unspecified chronic kidney disease: Secondary | ICD-10-CM | POA: Diagnosis not present

## 2017-05-10 DIAGNOSIS — N183 Chronic kidney disease, stage 3 (moderate): Secondary | ICD-10-CM | POA: Diagnosis not present

## 2017-05-10 DIAGNOSIS — E1165 Type 2 diabetes mellitus with hyperglycemia: Secondary | ICD-10-CM | POA: Diagnosis not present

## 2017-05-10 DIAGNOSIS — E1122 Type 2 diabetes mellitus with diabetic chronic kidney disease: Secondary | ICD-10-CM | POA: Diagnosis not present

## 2017-05-10 DIAGNOSIS — N179 Acute kidney failure, unspecified: Secondary | ICD-10-CM | POA: Diagnosis not present

## 2017-05-10 DIAGNOSIS — Z794 Long term (current) use of insulin: Secondary | ICD-10-CM | POA: Diagnosis not present

## 2017-05-10 DIAGNOSIS — E871 Hypo-osmolality and hyponatremia: Secondary | ICD-10-CM | POA: Diagnosis not present

## 2017-05-11 DIAGNOSIS — I10 Essential (primary) hypertension: Secondary | ICD-10-CM | POA: Diagnosis not present

## 2017-05-11 DIAGNOSIS — E1165 Type 2 diabetes mellitus with hyperglycemia: Secondary | ICD-10-CM | POA: Insufficient documentation

## 2017-05-11 DIAGNOSIS — E871 Hypo-osmolality and hyponatremia: Secondary | ICD-10-CM | POA: Insufficient documentation

## 2017-05-11 DIAGNOSIS — N179 Acute kidney failure, unspecified: Secondary | ICD-10-CM | POA: Diagnosis not present

## 2017-05-19 ENCOUNTER — Other Ambulatory Visit (INDEPENDENT_AMBULATORY_CARE_PROVIDER_SITE_OTHER): Payer: Self-pay | Admitting: Physician Assistant

## 2017-05-19 NOTE — Telephone Encounter (Signed)
FWD to PCP. Aaliyan Brinkmeier S Mclane Arora, CMA  

## 2017-06-04 ENCOUNTER — Telehealth: Payer: Self-pay | Admitting: *Deleted

## 2017-06-04 NOTE — Telephone Encounter (Signed)
I have been unable to locate this patient. He did not present for his MD appt. I have emailed [facility] to see if Mr Amison has been seen at the shelter

## 2017-06-07 DIAGNOSIS — Z981 Arthrodesis status: Secondary | ICD-10-CM | POA: Diagnosis not present

## 2017-06-07 DIAGNOSIS — G8929 Other chronic pain: Secondary | ICD-10-CM | POA: Diagnosis not present

## 2017-06-07 DIAGNOSIS — Z79899 Other long term (current) drug therapy: Secondary | ICD-10-CM | POA: Diagnosis not present

## 2017-06-07 DIAGNOSIS — E119 Type 2 diabetes mellitus without complications: Secondary | ICD-10-CM | POA: Diagnosis not present

## 2017-06-07 DIAGNOSIS — I2510 Atherosclerotic heart disease of native coronary artery without angina pectoris: Secondary | ICD-10-CM | POA: Diagnosis not present

## 2017-06-07 DIAGNOSIS — M545 Low back pain: Secondary | ICD-10-CM | POA: Diagnosis not present

## 2017-06-07 DIAGNOSIS — I10 Essential (primary) hypertension: Secondary | ICD-10-CM | POA: Diagnosis not present

## 2017-06-07 DIAGNOSIS — R079 Chest pain, unspecified: Secondary | ICD-10-CM | POA: Diagnosis not present

## 2017-06-07 DIAGNOSIS — Z955 Presence of coronary angioplasty implant and graft: Secondary | ICD-10-CM | POA: Diagnosis not present

## 2017-06-07 DIAGNOSIS — I259 Chronic ischemic heart disease, unspecified: Secondary | ICD-10-CM | POA: Diagnosis not present

## 2017-06-07 DIAGNOSIS — M542 Cervicalgia: Secondary | ICD-10-CM | POA: Diagnosis not present

## 2017-06-07 DIAGNOSIS — Z7982 Long term (current) use of aspirin: Secondary | ICD-10-CM | POA: Diagnosis not present

## 2017-06-07 DIAGNOSIS — Z794 Long term (current) use of insulin: Secondary | ICD-10-CM | POA: Diagnosis not present

## 2017-06-07 DIAGNOSIS — R52 Pain, unspecified: Secondary | ICD-10-CM | POA: Diagnosis not present

## 2017-06-11 ENCOUNTER — Other Ambulatory Visit (INDEPENDENT_AMBULATORY_CARE_PROVIDER_SITE_OTHER): Payer: Self-pay | Admitting: Physician Assistant

## 2017-06-11 DIAGNOSIS — E119 Type 2 diabetes mellitus without complications: Secondary | ICD-10-CM

## 2017-06-11 DIAGNOSIS — Z794 Long term (current) use of insulin: Principal | ICD-10-CM

## 2017-06-12 ENCOUNTER — Ambulatory Visit: Payer: Self-pay | Admitting: *Deleted

## 2017-06-12 DIAGNOSIS — Z9114 Patient's other noncompliance with medication regimen: Secondary | ICD-10-CM

## 2017-06-13 NOTE — Telephone Encounter (Signed)
Refill request for insulin.

## 2017-06-19 ENCOUNTER — Telehealth (INDEPENDENT_AMBULATORY_CARE_PROVIDER_SITE_OTHER): Payer: Self-pay

## 2017-06-19 NOTE — Telephone Encounter (Signed)
-----   Message from [name], PA-C sent at 06/13/2017  1:10 PM EDT ----- Have patient return here for diabetes management.

## 2017-06-19 NOTE — Telephone Encounter (Signed)
Unable to reach patient.

## 2017-06-24 ENCOUNTER — Other Ambulatory Visit (INDEPENDENT_AMBULATORY_CARE_PROVIDER_SITE_OTHER): Payer: Self-pay | Admitting: Physician Assistant

## 2017-06-24 DIAGNOSIS — Z794 Long term (current) use of insulin: Principal | ICD-10-CM

## 2017-06-24 DIAGNOSIS — E119 Type 2 diabetes mellitus without complications: Secondary | ICD-10-CM

## 2017-06-25 ENCOUNTER — Other Ambulatory Visit (INDEPENDENT_AMBULATORY_CARE_PROVIDER_SITE_OTHER): Payer: Self-pay | Admitting: Physician Assistant

## 2017-06-25 DIAGNOSIS — R202 Paresthesia of skin: Secondary | ICD-10-CM | POA: Diagnosis not present

## 2017-06-25 DIAGNOSIS — R079 Chest pain, unspecified: Secondary | ICD-10-CM | POA: Diagnosis not present

## 2017-06-25 DIAGNOSIS — R0602 Shortness of breath: Secondary | ICD-10-CM | POA: Diagnosis not present

## 2017-06-25 DIAGNOSIS — R51 Headache: Secondary | ICD-10-CM | POA: Diagnosis not present

## 2017-06-25 DIAGNOSIS — M542 Cervicalgia: Secondary | ICD-10-CM | POA: Diagnosis not present

## 2017-06-25 NOTE — Telephone Encounter (Signed)
FWD to PCP. Federica Allport S Kennth Vanbenschoten, CMA  

## 2017-06-25 NOTE — Telephone Encounter (Signed)
FWD to PCP. Amylah Will S Jerika Wales, CMA  

## 2017-06-30 ENCOUNTER — Ambulatory Visit: Payer: Self-pay | Admitting: *Deleted

## 2017-06-30 DIAGNOSIS — Z9114 Patient's other noncompliance with medication regimen: Secondary | ICD-10-CM

## 2017-06-30 DIAGNOSIS — B20 Human immunodeficiency virus [HIV] disease: Secondary | ICD-10-CM

## 2017-07-01 NOTE — Progress Notes (Signed)
Traveled to [employer] and asked if Mr. Thrall is receiving mail here. Mr. Middlebrooks is not receiving mail but I was asked to verify with [place]) that the patient has not been seen.  Spoke with Mia and she does know Mr Hannan but has not seen him in a very long time. Traveled to [employer] [place]) and she stated she also has not seen Mr Capili in a long time. I will close this referral at this time

## 2017-07-03 ENCOUNTER — Other Ambulatory Visit (INDEPENDENT_AMBULATORY_CARE_PROVIDER_SITE_OTHER): Payer: Self-pay | Admitting: Physician Assistant

## 2017-07-03 DIAGNOSIS — E119 Type 2 diabetes mellitus without complications: Secondary | ICD-10-CM

## 2017-07-03 DIAGNOSIS — Z794 Long term (current) use of insulin: Principal | ICD-10-CM

## 2017-07-03 NOTE — Telephone Encounter (Signed)
FWD to PCP. Michael [name] Michael [name] Michael [name], CMA  

## 2017-07-04 DIAGNOSIS — I259 Chronic ischemic heart disease, unspecified: Secondary | ICD-10-CM | POA: Diagnosis not present

## 2017-07-04 DIAGNOSIS — I10 Essential (primary) hypertension: Secondary | ICD-10-CM | POA: Diagnosis not present

## 2017-07-04 DIAGNOSIS — M549 Dorsalgia, unspecified: Secondary | ICD-10-CM | POA: Diagnosis not present

## 2017-07-04 DIAGNOSIS — M109 Gout, unspecified: Secondary | ICD-10-CM | POA: Diagnosis not present

## 2017-07-04 DIAGNOSIS — E785 Hyperlipidemia, unspecified: Secondary | ICD-10-CM | POA: Diagnosis not present

## 2017-07-04 DIAGNOSIS — Z86711 Personal history of pulmonary embolism: Secondary | ICD-10-CM | POA: Insufficient documentation

## 2017-07-04 DIAGNOSIS — E1140 Type 2 diabetes mellitus with diabetic neuropathy, unspecified: Secondary | ICD-10-CM | POA: Diagnosis not present

## 2017-07-04 DIAGNOSIS — E1165 Type 2 diabetes mellitus with hyperglycemia: Secondary | ICD-10-CM | POA: Diagnosis not present

## 2017-07-04 DIAGNOSIS — I480 Paroxysmal atrial fibrillation: Secondary | ICD-10-CM | POA: Diagnosis not present

## 2017-07-04 DIAGNOSIS — Z23 Encounter for immunization: Secondary | ICD-10-CM | POA: Diagnosis not present

## 2017-07-04 NOTE — Telephone Encounter (Signed)
Patient is in [place] for rehab, asked family member to have him call our office to schedule an appointment when he returns. [name], CMA

## 2017-07-09 ENCOUNTER — Ambulatory Visit: Payer: Self-pay | Admitting: Infectious Diseases

## 2017-07-21 NOTE — Progress Notes (Signed)
Traveled to [employer]) to see if the Corey receives mail at the facility. Plan is to leave a note asking him to please get in contact with me. [facility] has a note on the door stating the office will be closed until Monday. Unable to leave a note at this time

## 2017-07-25 DIAGNOSIS — E1165 Type 2 diabetes mellitus with hyperglycemia: Secondary | ICD-10-CM | POA: Diagnosis not present

## 2017-07-25 DIAGNOSIS — R0602 Shortness of breath: Secondary | ICD-10-CM | POA: Diagnosis not present

## 2017-07-25 DIAGNOSIS — D61818 Other pancytopenia: Secondary | ICD-10-CM | POA: Diagnosis not present

## 2017-07-25 DIAGNOSIS — M549 Dorsalgia, unspecified: Secondary | ICD-10-CM | POA: Diagnosis not present

## 2017-07-25 DIAGNOSIS — Z760 Encounter for issue of repeat prescription: Secondary | ICD-10-CM | POA: Diagnosis not present

## 2017-07-25 DIAGNOSIS — I491 Atrial premature depolarization: Secondary | ICD-10-CM | POA: Diagnosis not present

## 2017-07-25 DIAGNOSIS — Z7984 Long term (current) use of oral hypoglycemic drugs: Secondary | ICD-10-CM | POA: Diagnosis not present

## 2017-07-25 DIAGNOSIS — Z794 Long term (current) use of insulin: Secondary | ICD-10-CM | POA: Diagnosis not present

## 2017-07-26 DIAGNOSIS — I491 Atrial premature depolarization: Secondary | ICD-10-CM | POA: Diagnosis not present

## 2017-07-28 DIAGNOSIS — I259 Chronic ischemic heart disease, unspecified: Secondary | ICD-10-CM | POA: Diagnosis not present

## 2017-07-28 DIAGNOSIS — R739 Hyperglycemia, unspecified: Secondary | ICD-10-CM | POA: Diagnosis not present

## 2017-07-28 DIAGNOSIS — I10 Essential (primary) hypertension: Secondary | ICD-10-CM | POA: Diagnosis not present

## 2017-07-31 ENCOUNTER — Other Ambulatory Visit (INDEPENDENT_AMBULATORY_CARE_PROVIDER_SITE_OTHER): Payer: Self-pay | Admitting: Physician Assistant

## 2017-07-31 ENCOUNTER — Other Ambulatory Visit: Payer: Self-pay | Admitting: Infectious Diseases

## 2017-07-31 DIAGNOSIS — B20 Human immunodeficiency virus [HIV] disease: Secondary | ICD-10-CM

## 2017-07-31 NOTE — Telephone Encounter (Signed)
FWD to PCP. Roizy Harold S Mikhala Kenan, CMA  

## 2017-08-05 ENCOUNTER — Other Ambulatory Visit (INDEPENDENT_AMBULATORY_CARE_PROVIDER_SITE_OTHER): Payer: Self-pay | Admitting: Physician Assistant

## 2017-08-05 NOTE — Telephone Encounter (Signed)
FWD to PCP. Tempestt S Roberts, CMA  

## 2017-08-10 DIAGNOSIS — M542 Cervicalgia: Secondary | ICD-10-CM | POA: Diagnosis not present

## 2017-08-11 ENCOUNTER — Telehealth: Payer: Self-pay | Admitting: *Deleted

## 2017-08-11 DIAGNOSIS — I491 Atrial premature depolarization: Secondary | ICD-10-CM | POA: Diagnosis not present

## 2017-08-11 NOTE — Telephone Encounter (Signed)
Patient was admitted to [name] floor with suicidal ideation this past weekend. Clair Gulling, RN, calling to update Dr [name]. Patient has not been in office since 01/2016. Delories [facility] has made multiple attempts to reengage patient into care. Clair Gulling confirmed medication regimen. He will fax labs once they are drawn, resulted. Will notify bridge counseling in the hopes to reengage in care. Landis Gandy, RN

## 2017-08-13 DIAGNOSIS — G8929 Other chronic pain: Secondary | ICD-10-CM | POA: Diagnosis not present

## 2017-08-13 DIAGNOSIS — E119 Type 2 diabetes mellitus without complications: Secondary | ICD-10-CM | POA: Diagnosis not present

## 2017-08-13 DIAGNOSIS — M549 Dorsalgia, unspecified: Secondary | ICD-10-CM | POA: Diagnosis not present

## 2017-08-13 DIAGNOSIS — M542 Cervicalgia: Secondary | ICD-10-CM | POA: Diagnosis not present

## 2017-08-14 ENCOUNTER — Other Ambulatory Visit (INDEPENDENT_AMBULATORY_CARE_PROVIDER_SITE_OTHER): Payer: Self-pay | Admitting: Physician Assistant

## 2017-08-19 DIAGNOSIS — R0981 Nasal congestion: Secondary | ICD-10-CM | POA: Diagnosis not present

## 2017-08-19 DIAGNOSIS — J069 Acute upper respiratory infection, unspecified: Secondary | ICD-10-CM | POA: Diagnosis not present

## 2017-08-19 DIAGNOSIS — R05 Cough: Secondary | ICD-10-CM | POA: Diagnosis not present

## 2017-08-19 DIAGNOSIS — R509 Fever, unspecified: Secondary | ICD-10-CM | POA: Diagnosis not present

## 2017-08-19 DIAGNOSIS — R0602 Shortness of breath: Secondary | ICD-10-CM | POA: Diagnosis not present

## 2017-08-20 ENCOUNTER — Other Ambulatory Visit (INDEPENDENT_AMBULATORY_CARE_PROVIDER_SITE_OTHER): Payer: Self-pay | Admitting: Physician Assistant

## 2017-08-27 ENCOUNTER — Encounter (INDEPENDENT_AMBULATORY_CARE_PROVIDER_SITE_OTHER): Payer: Self-pay

## 2017-09-03 DIAGNOSIS — Z794 Long term (current) use of insulin: Secondary | ICD-10-CM | POA: Diagnosis not present

## 2017-09-03 DIAGNOSIS — J45909 Unspecified asthma, uncomplicated: Secondary | ICD-10-CM | POA: Diagnosis not present

## 2017-09-03 DIAGNOSIS — I2510 Atherosclerotic heart disease of native coronary artery without angina pectoris: Secondary | ICD-10-CM | POA: Diagnosis not present

## 2017-09-03 DIAGNOSIS — I10 Essential (primary) hypertension: Secondary | ICD-10-CM | POA: Diagnosis not present

## 2017-09-03 DIAGNOSIS — Z955 Presence of coronary angioplasty implant and graft: Secondary | ICD-10-CM | POA: Diagnosis not present

## 2017-09-03 DIAGNOSIS — Z79899 Other long term (current) drug therapy: Secondary | ICD-10-CM | POA: Diagnosis not present

## 2017-09-03 DIAGNOSIS — E1165 Type 2 diabetes mellitus with hyperglycemia: Secondary | ICD-10-CM | POA: Diagnosis not present

## 2017-09-03 DIAGNOSIS — Z760 Encounter for issue of repeat prescription: Secondary | ICD-10-CM | POA: Diagnosis not present

## 2017-09-03 DIAGNOSIS — G8929 Other chronic pain: Secondary | ICD-10-CM | POA: Diagnosis not present

## 2017-09-03 DIAGNOSIS — M542 Cervicalgia: Secondary | ICD-10-CM | POA: Diagnosis not present

## 2017-09-22 ENCOUNTER — Other Ambulatory Visit (INDEPENDENT_AMBULATORY_CARE_PROVIDER_SITE_OTHER): Payer: Self-pay | Admitting: Physician Assistant

## 2017-09-22 NOTE — Telephone Encounter (Signed)
FWD to PCP. Tempestt S Roberts, CMA  

## 2017-09-24 ENCOUNTER — Other Ambulatory Visit: Payer: Self-pay | Admitting: *Deleted

## 2017-09-24 DIAGNOSIS — B20 Human immunodeficiency virus [HIV] disease: Secondary | ICD-10-CM

## 2017-09-24 MED ORDER — EMTRICITAB-RILPIVIR-TENOFOV AF 200-25-25 MG PO TABS: 1.0000 | ORAL_TABLET | Freq: Every day | ORAL | 5 refills | Status: DC

## 2017-10-08 ENCOUNTER — Other Ambulatory Visit (INDEPENDENT_AMBULATORY_CARE_PROVIDER_SITE_OTHER): Payer: Self-pay | Admitting: Physician Assistant

## 2017-10-08 DIAGNOSIS — E1165 Type 2 diabetes mellitus with hyperglycemia: Principal | ICD-10-CM

## 2017-10-08 DIAGNOSIS — IMO0002 Reserved for concepts with insufficient information to code with codable children: Secondary | ICD-10-CM

## 2017-10-08 DIAGNOSIS — E1140 Type 2 diabetes mellitus with diabetic neuropathy, unspecified: Secondary | ICD-10-CM

## 2017-10-09 NOTE — Telephone Encounter (Signed)
FWD to PCP. Tempestt S Roberts, CMA  

## 2017-10-14 ENCOUNTER — Other Ambulatory Visit: Payer: Medicare Other

## 2017-10-14 DIAGNOSIS — Z79899 Other long term (current) drug therapy: Secondary | ICD-10-CM

## 2017-10-14 DIAGNOSIS — B20 Human immunodeficiency virus [HIV] disease: Secondary | ICD-10-CM

## 2017-10-14 DIAGNOSIS — Z113 Encounter for screening for infections with a predominantly sexual mode of transmission: Secondary | ICD-10-CM

## 2017-10-15 LAB — CBC WITH DIFFERENTIAL/PLATELET
BASOS ABS: 22 {cells}/uL (ref 0–200)
Basophils Relative: 0.5 %
EOS ABS: 70 {cells}/uL (ref 15–500)
Eosinophils Relative: 1.6 %
HEMATOCRIT: 34 % — AB (ref 38.5–50.0)
HEMOGLOBIN: 11.4 g/dL — AB (ref 13.2–17.1)
LYMPHS ABS: 1522 {cells}/uL (ref 850–3900)
MCH: 27.1 pg (ref 27.0–33.0)
MCHC: 33.5 g/dL (ref 32.0–36.0)
MCV: 81 fL (ref 80.0–100.0)
MPV: 11.2 fL (ref 7.5–12.5)
Monocytes Relative: 9.8 %
NEUTROS ABS: 2354 {cells}/uL (ref 1500–7800)
NEUTROS PCT: 53.5 %
Platelets: 166 10*3/uL (ref 140–400)
RBC: 4.2 10*6/uL (ref 4.20–5.80)
RDW: 14.9 % (ref 11.0–15.0)
Total Lymphocyte: 34.6 %
WBC mixed population: 431 cells/uL (ref 200–950)
WBC: 4.4 10*3/uL (ref 3.8–10.8)

## 2017-10-15 LAB — COMPREHENSIVE METABOLIC PANEL
AG RATIO: 1.3 (calc) (ref 1.0–2.5)
ALBUMIN MSPROF: 4.2 g/dL (ref 3.6–5.1)
ALT: 12 U/L (ref 9–46)
AST: 18 U/L (ref 10–35)
Alkaline phosphatase (APISO): 61 U/L (ref 40–115)
BUN / CREAT RATIO: 16 (calc) (ref 6–22)
BUN: 25 mg/dL (ref 7–25)
CO2: 22 mmol/L (ref 20–32)
Calcium: 9.2 mg/dL (ref 8.6–10.3)
Chloride: 109 mmol/L (ref 98–110)
Creat: 1.52 mg/dL — ABNORMAL HIGH (ref 0.70–1.25)
GLUCOSE: 113 mg/dL — AB (ref 65–99)
Globulin: 3.2 g/dL (calc) (ref 1.9–3.7)
POTASSIUM: 4.4 mmol/L (ref 3.5–5.3)
Sodium: 141 mmol/L (ref 135–146)
TOTAL PROTEIN: 7.4 g/dL (ref 6.1–8.1)
Total Bilirubin: 0.3 mg/dL (ref 0.2–1.2)

## 2017-10-15 LAB — T-HELPER CELL (CD4) - (RCID CLINIC ONLY)
CD4 T CELL ABS: 630 /uL (ref 400–2700)
CD4 T CELL HELPER: 33 % (ref 33–55)

## 2017-10-15 LAB — RPR: RPR Ser Ql: NONREACTIVE

## 2017-10-15 LAB — LIPID PANEL
CHOL/HDL RATIO: 2.9 (calc) (ref <5.0)
CHOLESTEROL: 188 mg/dL (ref <200)
HDL: 64 mg/dL (ref >40)
LDL CHOLESTEROL (CALC): 103 mg/dL — AB
Non-HDL Cholesterol (Calc): 124 mg/dL (calc) (ref <130)
Triglycerides: 113 mg/dL (ref <150)

## 2017-10-16 LAB — HIV-1 RNA ULTRAQUANT REFLEX TO GENTYP+
HIV 1 RNA Quant: <20 Copies/mL
HIV-1 RNA Quant, Log: <1.3 Log cps/mL

## 2017-10-21 ENCOUNTER — Ambulatory Visit: Payer: Self-pay | Admitting: Infectious Diseases

## 2017-10-22 ENCOUNTER — Other Ambulatory Visit: Payer: Self-pay

## 2017-10-22 ENCOUNTER — Ambulatory Visit (HOSPITAL_COMMUNITY)
Admission: RE | Admit: 2017-10-22 | Discharge: 2017-10-22 | Disposition: A | Discharge status: Home / Self Care | Payer: Medicare Other | Source: Ambulatory Visit | Attending: Infectious Diseases | Admitting: Infectious Diseases

## 2017-10-22 ENCOUNTER — Ambulatory Visit (INDEPENDENT_AMBULATORY_CARE_PROVIDER_SITE_OTHER): Payer: Medicare Other | Admitting: Infectious Diseases

## 2017-10-22 VITALS — BP 170/101 | HR 88 | Temp 97.6°F | Wt 188.0 lb

## 2017-10-22 DIAGNOSIS — E1140 Type 2 diabetes mellitus with diabetic neuropathy, unspecified: Secondary | ICD-10-CM

## 2017-10-22 DIAGNOSIS — Z113 Encounter for screening for infections with a predominantly sexual mode of transmission: Secondary | ICD-10-CM | POA: Diagnosis not present

## 2017-10-22 DIAGNOSIS — N183 Chronic kidney disease, stage 3 unspecified: Secondary | ICD-10-CM

## 2017-10-22 DIAGNOSIS — I498 Other specified cardiac arrhythmias: Secondary | ICD-10-CM | POA: Diagnosis not present

## 2017-10-22 DIAGNOSIS — B20 Human immunodeficiency virus [HIV] disease: Secondary | ICD-10-CM | POA: Diagnosis not present

## 2017-10-22 DIAGNOSIS — R0789 Other chest pain: Secondary | ICD-10-CM | POA: Insufficient documentation

## 2017-10-22 DIAGNOSIS — E1165 Type 2 diabetes mellitus with hyperglycemia: Secondary | ICD-10-CM | POA: Diagnosis not present

## 2017-10-22 DIAGNOSIS — I252 Old myocardial infarction: Secondary | ICD-10-CM | POA: Insufficient documentation

## 2017-10-22 DIAGNOSIS — J45909 Unspecified asthma, uncomplicated: Secondary | ICD-10-CM | POA: Diagnosis not present

## 2017-10-22 DIAGNOSIS — I10 Essential (primary) hypertension: Secondary | ICD-10-CM | POA: Diagnosis not present

## 2017-10-22 DIAGNOSIS — F1494 Cocaine use, unspecified with cocaine-induced mood disorder: Secondary | ICD-10-CM | POA: Diagnosis not present

## 2017-10-22 DIAGNOSIS — B1920 Unspecified viral hepatitis C without hepatic coma: Secondary | ICD-10-CM | POA: Diagnosis not present

## 2017-10-22 DIAGNOSIS — IMO0002 Reserved for concepts with insufficient information to code with codable children: Secondary | ICD-10-CM

## 2017-10-22 DIAGNOSIS — Z23 Encounter for immunization: Secondary | ICD-10-CM

## 2017-10-22 DIAGNOSIS — I2510 Atherosclerotic heart disease of native coronary artery without angina pectoris: Secondary | ICD-10-CM

## 2017-10-22 MED ORDER — EMTRICITAB-RILPIVIR-TENOFOV AF 200-25-25 MG PO TABS: 1.0000 | ORAL_TABLET | Freq: Every day | ORAL | 5 refills | Status: DC

## 2017-10-22 MED ORDER — ALBUTEROL SULFATE HFA 108 (90 BASE) MCG/ACT IN AERS: 2.0000 | INHALATION_SPRAY | Freq: Four times a day (QID) | RESPIRATORY_TRACT | 0 refills | Status: DC | PRN

## 2017-10-22 NOTE — Assessment & Plan Note (Signed)
Appreciate PCP f/u

## 2017-10-22 NOTE — Progress Notes (Signed)
   Subjective:    Patient ID: Michael [name], male    DOB: 09-Feb-1951, 67 y.o.   MRN: [id number]  HPI 67 yo M with hx of uncontrolled DM2, HIV+ (previously on Complera), recent re-stent to LAD January 2015 after STEMI (06-2013).  He had L CEA on Feb 2017. Last visit was 01-2016. Has had adm/ED visits for cocaine, DM since.  He has also had psychiatry f/u.   States his sugars have been normal. Doesn't get low. Stays in a mission and food is not diabetic diet.  Has been off cocaine for 4 months. In drug rehab housing.   HIV 1 RNA Quant  Date Value  10/14/2017 <20 Copies/mL  01/08/2016 <20 copies/mL  03/20/2015 <20 copies/mL   CD4 T Cell Abs (/uL)  Date Value  10/14/2017 630  01/08/2016 440  03/20/2015 340 (L)   Cr is 1.52 down from 1.98.   Review of Systems  Constitutional: Negative for appetite change and unexpected weight change.  Respiratory: Positive for cough and chest tightness.   Cardiovascular: Negative for chest pain.  Gastrointestinal: Negative for constipation and diarrhea.  Genitourinary: Negative for difficulty urinating.  Psychiatric/Behavioral: Negative for sleep disturbance.  wt up 14#. running out of his inhalers.  Please see HPI. All other systems reviewed and negative.     Objective:   Physical Exam  Constitutional: He appears well-developed and well-nourished.  HENT:  Mouth/Throat: No oropharyngeal exudate.  Eyes: EOM are normal. Pupils are equal, round, and reactive to light.  Neck: Neck supple.  Cardiovascular: Normal rate, regular rhythm and normal heart sounds.  Pulmonary/Chest: Effort normal and breath sounds normal.      Abdominal: Soft. Bowel sounds are normal. There is no tenderness. There is no rebound.  Musculoskeletal: He exhibits no edema.  Lymphadenopathy:    He has no cervical adenopathy.  Psychiatric: He has a normal mood and affect.      Assessment & Plan:

## 2017-10-22 NOTE — Assessment & Plan Note (Signed)
Will refill his inhalers, suspect cause of his chest tightness.

## 2017-10-22 NOTE — Assessment & Plan Note (Signed)
Is slightly improved from previous. Overall stable.

## 2017-10-22 NOTE — Assessment & Plan Note (Addendum)
Appears to be doing well- living in rehab.  Staying clean, encouraged.  Will link him to [facility]

## 2017-10-22 NOTE — Assessment & Plan Note (Signed)
His Hep C Ab in 2016 was negative.  Will recheck and if continues to be negative, mark as resolved.

## 2017-10-22 NOTE — Assessment & Plan Note (Signed)
He is doing well Flu shot today.  Will sign his housing letter when available.  rtc 9 months.

## 2017-10-22 NOTE — Assessment & Plan Note (Addendum)
Will recheck his ECG today given increased BP and chest tightness.  His is unclear what his sx were when he prev had his MI  Addendum- his ECG is sinus arhythmia 76 bpm, no st-t changes. Sent for reading.

## 2017-10-22 NOTE — Assessment & Plan Note (Addendum)
Out of range today. (repeat 185/98) He will call his PCP.  asx

## 2017-10-23 DIAGNOSIS — R509 Fever, unspecified: Secondary | ICD-10-CM | POA: Diagnosis not present

## 2017-10-23 DIAGNOSIS — R062 Wheezing: Secondary | ICD-10-CM | POA: Diagnosis not present

## 2017-10-23 DIAGNOSIS — J069 Acute upper respiratory infection, unspecified: Secondary | ICD-10-CM | POA: Diagnosis not present

## 2017-10-23 DIAGNOSIS — J029 Acute pharyngitis, unspecified: Secondary | ICD-10-CM | POA: Diagnosis not present

## 2017-10-23 DIAGNOSIS — R0981 Nasal congestion: Secondary | ICD-10-CM | POA: Diagnosis not present

## 2017-10-23 DIAGNOSIS — R05 Cough: Secondary | ICD-10-CM | POA: Diagnosis not present

## 2017-11-02 DIAGNOSIS — I10 Essential (primary) hypertension: Secondary | ICD-10-CM | POA: Diagnosis not present

## 2017-11-02 DIAGNOSIS — I429 Cardiomyopathy, unspecified: Secondary | ICD-10-CM | POA: Diagnosis not present

## 2017-11-02 DIAGNOSIS — I2510 Atherosclerotic heart disease of native coronary artery without angina pectoris: Secondary | ICD-10-CM | POA: Diagnosis not present

## 2017-11-03 DIAGNOSIS — E785 Hyperlipidemia, unspecified: Secondary | ICD-10-CM | POA: Diagnosis not present

## 2017-11-03 DIAGNOSIS — E0865 Diabetes mellitus due to underlying condition with hyperglycemia: Secondary | ICD-10-CM | POA: Diagnosis not present

## 2017-11-03 DIAGNOSIS — I129 Hypertensive chronic kidney disease with stage 1 through stage 4 chronic kidney disease, or unspecified chronic kidney disease: Secondary | ICD-10-CM | POA: Diagnosis not present

## 2017-11-03 DIAGNOSIS — I10 Essential (primary) hypertension: Secondary | ICD-10-CM | POA: Diagnosis not present

## 2017-11-03 DIAGNOSIS — N189 Chronic kidney disease, unspecified: Secondary | ICD-10-CM | POA: Diagnosis not present

## 2017-11-03 DIAGNOSIS — E0822 Diabetes mellitus due to underlying condition with diabetic chronic kidney disease: Secondary | ICD-10-CM | POA: Diagnosis not present

## 2017-11-03 DIAGNOSIS — J45909 Unspecified asthma, uncomplicated: Secondary | ICD-10-CM | POA: Diagnosis not present

## 2017-11-03 DIAGNOSIS — R001 Bradycardia, unspecified: Secondary | ICD-10-CM | POA: Diagnosis not present

## 2017-11-03 DIAGNOSIS — N182 Chronic kidney disease, stage 2 (mild): Secondary | ICD-10-CM | POA: Diagnosis not present

## 2017-11-04 DIAGNOSIS — R001 Bradycardia, unspecified: Secondary | ICD-10-CM | POA: Diagnosis not present

## 2017-11-05 DIAGNOSIS — R079 Chest pain, unspecified: Secondary | ICD-10-CM | POA: Diagnosis not present

## 2017-11-05 DIAGNOSIS — I2510 Atherosclerotic heart disease of native coronary artery without angina pectoris: Secondary | ICD-10-CM | POA: Diagnosis not present

## 2017-11-05 DIAGNOSIS — E785 Hyperlipidemia, unspecified: Secondary | ICD-10-CM | POA: Diagnosis not present

## 2017-11-05 DIAGNOSIS — I491 Atrial premature depolarization: Secondary | ICD-10-CM | POA: Diagnosis not present

## 2017-11-05 DIAGNOSIS — I129 Hypertensive chronic kidney disease with stage 1 through stage 4 chronic kidney disease, or unspecified chronic kidney disease: Secondary | ICD-10-CM | POA: Diagnosis not present

## 2017-11-05 DIAGNOSIS — I4891 Unspecified atrial fibrillation: Secondary | ICD-10-CM | POA: Diagnosis not present

## 2017-11-06 ENCOUNTER — Other Ambulatory Visit (INDEPENDENT_AMBULATORY_CARE_PROVIDER_SITE_OTHER): Payer: Self-pay | Admitting: Physician Assistant

## 2017-11-06 ENCOUNTER — Other Ambulatory Visit: Payer: Self-pay | Admitting: Infectious Diseases

## 2017-11-06 DIAGNOSIS — I129 Hypertensive chronic kidney disease with stage 1 through stage 4 chronic kidney disease, or unspecified chronic kidney disease: Secondary | ICD-10-CM | POA: Diagnosis not present

## 2017-11-06 DIAGNOSIS — E785 Hyperlipidemia, unspecified: Secondary | ICD-10-CM | POA: Diagnosis not present

## 2017-11-06 DIAGNOSIS — I4891 Unspecified atrial fibrillation: Secondary | ICD-10-CM | POA: Diagnosis not present

## 2017-11-06 DIAGNOSIS — I252 Old myocardial infarction: Secondary | ICD-10-CM | POA: Diagnosis not present

## 2017-11-06 DIAGNOSIS — I25110 Atherosclerotic heart disease of native coronary artery with unstable angina pectoris: Secondary | ICD-10-CM | POA: Diagnosis not present

## 2017-11-06 DIAGNOSIS — I10 Essential (primary) hypertension: Secondary | ICD-10-CM | POA: Diagnosis not present

## 2017-11-06 DIAGNOSIS — J45909 Unspecified asthma, uncomplicated: Secondary | ICD-10-CM

## 2017-11-18 ENCOUNTER — Other Ambulatory Visit: Payer: Self-pay

## 2017-11-18 ENCOUNTER — Encounter (HOSPITAL_COMMUNITY): Payer: Self-pay

## 2017-11-18 ENCOUNTER — Inpatient Hospital Stay (HOSPITAL_COMMUNITY)
Admission: AD | Admit: 2017-11-18 | Discharge: 2017-11-24 | DRG: 885 | Disposition: A | Discharge status: Home / Self Care | Payer: Medicare Other | Source: Intra-hospital | Attending: Psychiatry | Admitting: Psychiatry

## 2017-11-18 ENCOUNTER — Emergency Department (HOSPITAL_COMMUNITY)
Admission: EM | Admit: 2017-11-18 | Discharge: 2017-11-18 | Disposition: A | Discharge status: Other Acute Inpatient Hospital | Payer: Medicare Other | Attending: Emergency Medicine | Admitting: Emergency Medicine

## 2017-11-18 ENCOUNTER — Encounter (HOSPITAL_COMMUNITY): Payer: Self-pay | Admitting: Emergency Medicine

## 2017-11-18 DIAGNOSIS — Z9049 Acquired absence of other specified parts of digestive tract: Secondary | ICD-10-CM

## 2017-11-18 DIAGNOSIS — F332 Major depressive disorder, recurrent severe without psychotic features: Principal | ICD-10-CM | POA: Diagnosis present

## 2017-11-18 DIAGNOSIS — N183 Chronic kidney disease, stage 3 (moderate): Secondary | ICD-10-CM | POA: Insufficient documentation

## 2017-11-18 DIAGNOSIS — Z794 Long term (current) use of insulin: Secondary | ICD-10-CM

## 2017-11-18 DIAGNOSIS — E119 Type 2 diabetes mellitus without complications: Secondary | ICD-10-CM

## 2017-11-18 DIAGNOSIS — Z8249 Family history of ischemic heart disease and other diseases of the circulatory system: Secondary | ICD-10-CM

## 2017-11-18 DIAGNOSIS — Z86711 Personal history of pulmonary embolism: Secondary | ICD-10-CM | POA: Diagnosis not present

## 2017-11-18 DIAGNOSIS — I2510 Atherosclerotic heart disease of native coronary artery without angina pectoris: Secondary | ICD-10-CM | POA: Diagnosis present

## 2017-11-18 DIAGNOSIS — K219 Gastro-esophageal reflux disease without esophagitis: Secondary | ICD-10-CM | POA: Diagnosis present

## 2017-11-18 DIAGNOSIS — E1122 Type 2 diabetes mellitus with diabetic chronic kidney disease: Secondary | ICD-10-CM | POA: Diagnosis present

## 2017-11-18 DIAGNOSIS — F419 Anxiety disorder, unspecified: Secondary | ICD-10-CM | POA: Diagnosis present

## 2017-11-18 DIAGNOSIS — B20 Human immunodeficiency virus [HIV] disease: Secondary | ICD-10-CM | POA: Diagnosis present

## 2017-11-18 DIAGNOSIS — I252 Old myocardial infarction: Secondary | ICD-10-CM | POA: Diagnosis not present

## 2017-11-18 DIAGNOSIS — E1165 Type 2 diabetes mellitus with hyperglycemia: Secondary | ICD-10-CM

## 2017-11-18 DIAGNOSIS — IMO0002 Reserved for concepts with insufficient information to code with codable children: Secondary | ICD-10-CM

## 2017-11-18 DIAGNOSIS — Z598 Other problems related to housing and economic circumstances: Secondary | ICD-10-CM

## 2017-11-18 DIAGNOSIS — J45909 Unspecified asthma, uncomplicated: Secondary | ICD-10-CM | POA: Diagnosis present

## 2017-11-18 DIAGNOSIS — F329 Major depressive disorder, single episode, unspecified: Secondary | ICD-10-CM | POA: Diagnosis not present

## 2017-11-18 DIAGNOSIS — F1414 Cocaine abuse with cocaine-induced mood disorder: Secondary | ICD-10-CM | POA: Diagnosis not present

## 2017-11-18 DIAGNOSIS — F1420 Cocaine dependence, uncomplicated: Secondary | ICD-10-CM | POA: Diagnosis not present

## 2017-11-18 DIAGNOSIS — I10 Essential (primary) hypertension: Secondary | ICD-10-CM | POA: Diagnosis not present

## 2017-11-18 DIAGNOSIS — Z79899 Other long term (current) drug therapy: Secondary | ICD-10-CM

## 2017-11-18 DIAGNOSIS — R45851 Suicidal ideations: Secondary | ICD-10-CM | POA: Diagnosis present

## 2017-11-18 DIAGNOSIS — I4891 Unspecified atrial fibrillation: Secondary | ICD-10-CM | POA: Insufficient documentation

## 2017-11-18 DIAGNOSIS — I129 Hypertensive chronic kidney disease with stage 1 through stage 4 chronic kidney disease, or unspecified chronic kidney disease: Secondary | ICD-10-CM | POA: Diagnosis present

## 2017-11-18 DIAGNOSIS — Z590 Homelessness: Secondary | ICD-10-CM

## 2017-11-18 DIAGNOSIS — Z818 Family history of other mental and behavioral disorders: Secondary | ICD-10-CM | POA: Diagnosis not present

## 2017-11-18 DIAGNOSIS — F1424 Cocaine dependence with cocaine-induced mood disorder: Secondary | ICD-10-CM | POA: Diagnosis present

## 2017-11-18 DIAGNOSIS — I491 Atrial premature depolarization: Secondary | ICD-10-CM | POA: Diagnosis not present

## 2017-11-18 DIAGNOSIS — G4700 Insomnia, unspecified: Secondary | ICD-10-CM | POA: Diagnosis present

## 2017-11-18 DIAGNOSIS — F322 Major depressive disorder, single episode, severe without psychotic features: Secondary | ICD-10-CM | POA: Insufficient documentation

## 2017-11-18 DIAGNOSIS — Z7901 Long term (current) use of anticoagulants: Secondary | ICD-10-CM | POA: Insufficient documentation

## 2017-11-18 DIAGNOSIS — E1140 Type 2 diabetes mellitus with diabetic neuropathy, unspecified: Secondary | ICD-10-CM | POA: Diagnosis present

## 2017-11-18 DIAGNOSIS — Z915 Personal history of self-harm: Secondary | ICD-10-CM

## 2017-11-18 DIAGNOSIS — E785 Hyperlipidemia, unspecified: Secondary | ICD-10-CM | POA: Diagnosis present

## 2017-11-18 DIAGNOSIS — Z955 Presence of coronary angioplasty implant and graft: Secondary | ICD-10-CM

## 2017-11-18 DIAGNOSIS — Z7984 Long term (current) use of oral hypoglycemic drugs: Secondary | ICD-10-CM | POA: Diagnosis not present

## 2017-11-18 DIAGNOSIS — Z833 Family history of diabetes mellitus: Secondary | ICD-10-CM

## 2017-11-18 DIAGNOSIS — M797 Fibromyalgia: Secondary | ICD-10-CM | POA: Diagnosis present

## 2017-11-18 DIAGNOSIS — F1410 Cocaine abuse, uncomplicated: Secondary | ICD-10-CM | POA: Insufficient documentation

## 2017-11-18 DIAGNOSIS — F333 Major depressive disorder, recurrent, severe with psychotic symptoms: Secondary | ICD-10-CM | POA: Diagnosis not present

## 2017-11-18 LAB — COMPREHENSIVE METABOLIC PANEL
ALBUMIN: 3.6 g/dL (ref 3.5–5.0)
ALT: 35 U/L (ref 17–63)
AST: 42 U/L — AB (ref 15–41)
Alkaline Phosphatase: 63 U/L (ref 38–126)
Anion gap: 11 (ref 5–15)
BILIRUBIN TOTAL: 0.8 mg/dL (ref 0.3–1.2)
BUN: 23 mg/dL — AB (ref 6–20)
CO2: 21 mmol/L — ABNORMAL LOW (ref 22–32)
CREATININE: 1.58 mg/dL — AB (ref 0.61–1.24)
Calcium: 8.3 mg/dL — ABNORMAL LOW (ref 8.9–10.3)
Chloride: 108 mmol/L (ref 101–111)
GFR calc Af Amer: 51 mL/min — ABNORMAL LOW (ref >60)
GFR, EST NON AFRICAN AMERICAN: 44 mL/min — AB (ref >60)
GLUCOSE: 187 mg/dL — AB (ref 65–99)
POTASSIUM: 3.7 mmol/L (ref 3.5–5.1)
Sodium: 140 mmol/L (ref 135–145)
TOTAL PROTEIN: 6.6 g/dL (ref 6.5–8.1)

## 2017-11-18 LAB — CBC WITH DIFFERENTIAL/PLATELET
BASOS ABS: 0 10*3/uL (ref 0.0–0.1)
BASOS PCT: 0 %
EOS ABS: 0.1 10*3/uL (ref 0.0–0.7)
Eosinophils Relative: 1 %
HCT: 26.8 % — ABNORMAL LOW (ref 39.0–52.0)
Hemoglobin: 9.3 g/dL — ABNORMAL LOW (ref 13.0–17.0)
Lymphocytes Relative: 27 %
Lymphs Abs: 1.4 10*3/uL (ref 0.7–4.0)
MCH: 29 pg (ref 26.0–34.0)
MCHC: 34.7 g/dL (ref 30.0–36.0)
MCV: 83.5 fL (ref 78.0–100.0)
MONO ABS: 0.5 10*3/uL (ref 0.1–1.0)
Monocytes Relative: 8 %
NEUTROS PCT: 64 %
Neutro Abs: 3.4 10*3/uL (ref 1.7–7.7)
Platelets: 130 10*3/uL — ABNORMAL LOW (ref 150–400)
RBC: 3.21 MIL/uL — ABNORMAL LOW (ref 4.22–5.81)
RDW: 14.1 % (ref 11.5–15.5)
WBC: 5.4 10*3/uL (ref 4.0–10.5)

## 2017-11-18 LAB — CBG MONITORING, ED: Glucose-Capillary: 174 mg/dL — ABNORMAL HIGH (ref 65–99)

## 2017-11-18 LAB — RAPID URINE DRUG SCREEN, HOSP PERFORMED
AMPHETAMINES: NOT DETECTED
BARBITURATES: NOT DETECTED
Benzodiazepines: NOT DETECTED
Cocaine: POSITIVE — AB
OPIATES: NOT DETECTED
TETRAHYDROCANNABINOL: NOT DETECTED

## 2017-11-18 LAB — GLUCOSE, CAPILLARY
Glucose-Capillary: 341 mg/dL — ABNORMAL HIGH (ref 65–99)
Glucose-Capillary: 349 mg/dL — ABNORMAL HIGH (ref 65–99)

## 2017-11-18 LAB — I-STAT TROPONIN, ED: TROPONIN I, POC: 0.01 ng/mL (ref 0.00–0.08)

## 2017-11-18 LAB — SALICYLATE LEVEL: Salicylate Lvl: <7 mg/dL (ref 2.8–30.0)

## 2017-11-18 LAB — ETHANOL: Alcohol, Ethyl (B): <10 mg/dL (ref <10)

## 2017-11-18 LAB — ACETAMINOPHEN LEVEL: Acetaminophen (Tylenol), Serum: <10 ug/mL — ABNORMAL LOW (ref 10–30)

## 2017-11-18 MED ORDER — LOSARTAN POTASSIUM 50 MG PO TABS
50.0000 mg | ORAL_TABLET | Freq: Every day | ORAL | Status: DC
  Administered 2017-11-18: 50 mg via ORAL
  Filled 2017-11-18: qty 1

## 2017-11-18 MED ORDER — ONDANSETRON HCL 4 MG PO TABS: 4.0000 mg | ORAL_TABLET | Freq: Three times a day (TID) | ORAL | Status: DC | PRN

## 2017-11-18 MED ORDER — ACETAMINOPHEN 325 MG PO TABS
650.0000 mg | ORAL_TABLET | Freq: Four times a day (QID) | ORAL | Status: DC | PRN
  Administered 2017-11-18 – 2017-11-22 (×3): 650 mg via ORAL
  Filled 2017-11-18 (×3): qty 2

## 2017-11-18 MED ORDER — DILTIAZEM HCL ER COATED BEADS 120 MG PO CP24
120.0000 mg | ORAL_CAPSULE | Freq: Every day | ORAL | Status: DC
  Administered 2017-11-18 – 2017-11-24 (×7): 120 mg via ORAL
  Filled 2017-11-18 (×10): qty 1

## 2017-11-18 MED ORDER — NICOTINE 21 MG/24HR TD PT24
21.0000 mg | MEDICATED_PATCH | Freq: Every day | TRANSDERMAL | Status: DC
  Filled 2017-11-18: qty 1

## 2017-11-18 MED ORDER — GABAPENTIN 400 MG PO CAPS
400.0000 mg | ORAL_CAPSULE | Freq: Two times a day (BID) | ORAL | Status: DC
  Administered 2017-11-18 – 2017-11-24 (×12): 400 mg via ORAL
  Filled 2017-11-18 (×16): qty 1

## 2017-11-18 MED ORDER — ACETAMINOPHEN 325 MG PO TABS: 650.0000 mg | ORAL_TABLET | ORAL | Status: DC | PRN

## 2017-11-18 MED ORDER — FAMOTIDINE 20 MG PO TABS
20.0000 mg | ORAL_TABLET | Freq: Every day | ORAL | Status: DC
  Administered 2017-11-18: 20 mg via ORAL
  Filled 2017-11-18: qty 1

## 2017-11-18 MED ORDER — ALUM & MAG HYDROXIDE-SIMETH 200-200-20 MG/5ML PO SUSP: 30.0000 mL | Freq: Four times a day (QID) | ORAL | Status: DC | PRN

## 2017-11-18 MED ORDER — GABAPENTIN 400 MG PO CAPS
400.0000 mg | ORAL_CAPSULE | Freq: Two times a day (BID) | ORAL | Status: DC
Start: 2017-11-18 — End: 2017-11-18
  Administered 2017-11-18: 400 mg via ORAL
  Filled 2017-11-18: qty 1

## 2017-11-18 MED ORDER — ALBUTEROL SULFATE HFA 108 (90 BASE) MCG/ACT IN AERS: 1.0000 | INHALATION_SPRAY | Freq: Four times a day (QID) | RESPIRATORY_TRACT | Status: DC | PRN

## 2017-11-18 MED ORDER — FAMOTIDINE 20 MG PO TABS
20.0000 mg | ORAL_TABLET | Freq: Every day | ORAL | Status: DC
  Administered 2017-11-19 – 2017-11-23 (×5): 20 mg via ORAL
  Filled 2017-11-18 (×8): qty 1

## 2017-11-18 MED ORDER — ALUM & MAG HYDROXIDE-SIMETH 200-200-20 MG/5ML PO SUSP: 30.0000 mL | ORAL | Status: DC | PRN

## 2017-11-18 MED ORDER — EMTRICITAB-RILPIVIR-TENOFOV AF 200-25-25 MG PO TABS
1.0000 | ORAL_TABLET | Freq: Every day | ORAL | Status: DC
  Administered 2017-11-19 – 2017-11-24 (×6): 1 via ORAL
  Filled 2017-11-18 (×7): qty 1

## 2017-11-18 MED ORDER — LOSARTAN POTASSIUM 50 MG PO TABS
50.0000 mg | ORAL_TABLET | Freq: Every day | ORAL | Status: DC
  Administered 2017-11-19 – 2017-11-24 (×6): 50 mg via ORAL
  Filled 2017-11-18 (×8): qty 1

## 2017-11-18 MED ORDER — NICOTINE 21 MG/24HR TD PT24
21.0000 mg | MEDICATED_PATCH | Freq: Every day | TRANSDERMAL | Status: DC
  Filled 2017-11-18 (×2): qty 1

## 2017-11-18 MED ORDER — HYDROXYZINE HCL 25 MG PO TABS
25.0000 mg | ORAL_TABLET | Freq: Three times a day (TID) | ORAL | Status: DC | PRN
  Administered 2017-11-21 – 2017-11-23 (×3): 25 mg via ORAL
  Filled 2017-11-18 (×3): qty 1

## 2017-11-18 MED ORDER — INSULIN GLARGINE 100 UNIT/ML ~~LOC~~ SOLN
30.0000 [IU] | Freq: Every day | SUBCUTANEOUS | Status: DC
  Administered 2017-11-18 – 2017-11-23 (×6): 30 [IU] via SUBCUTANEOUS

## 2017-11-18 MED ORDER — MAGNESIUM HYDROXIDE 400 MG/5ML PO SUSP: 30.0000 mL | Freq: Every day | ORAL | Status: DC | PRN

## 2017-11-18 MED ORDER — TRAZODONE HCL 50 MG PO TABS
50.0000 mg | ORAL_TABLET | Freq: Every evening | ORAL | Status: DC | PRN
  Administered 2017-11-18 – 2017-11-23 (×6): 50 mg via ORAL
  Filled 2017-11-18 (×6): qty 1

## 2017-11-18 MED ORDER — INSULIN ASPART 100 UNIT/ML ~~LOC~~ SOLN
5.0000 [IU] | Freq: Three times a day (TID) | SUBCUTANEOUS | Status: DC
  Administered 2017-11-19 – 2017-11-24 (×16): 5 [IU] via SUBCUTANEOUS

## 2017-11-18 MED ORDER — EMTRICITAB-RILPIVIR-TENOFOV AF 200-25-25 MG PO TABS
1.0000 | ORAL_TABLET | Freq: Every day | ORAL | Status: DC
  Administered 2017-11-18: 1 via ORAL
  Filled 2017-11-18: qty 1

## 2017-11-18 MED ORDER — INSULIN ASPART 100 UNIT/ML ~~LOC~~ SOLN
0.0000 [IU] | Freq: Three times a day (TID) | SUBCUTANEOUS | Status: DC
  Administered 2017-11-19 (×2): 8 [IU] via SUBCUTANEOUS
  Administered 2017-11-20 (×2): 5 [IU] via SUBCUTANEOUS
  Administered 2017-11-20: 3 [IU] via SUBCUTANEOUS
  Administered 2017-11-21: 5 [IU] via SUBCUTANEOUS
  Administered 2017-11-21: 2 [IU] via SUBCUTANEOUS
  Administered 2017-11-21: 3 [IU] via SUBCUTANEOUS
  Administered 2017-11-22: 5 [IU] via SUBCUTANEOUS
  Administered 2017-11-22 – 2017-11-23 (×3): 2 [IU] via SUBCUTANEOUS
  Administered 2017-11-23: 3 [IU] via SUBCUTANEOUS

## 2017-11-18 MED ORDER — METFORMIN HCL 500 MG PO TABS
1000.0000 mg | ORAL_TABLET | Freq: Two times a day (BID) | ORAL | Status: DC
  Administered 2017-11-19 – 2017-11-24 (×11): 1000 mg via ORAL
  Filled 2017-11-18 (×14): qty 2

## 2017-11-18 NOTE — Progress Notes (Signed)
D: Pt was in bed in his room upon initial approach.  Pt presents with depressed affect and mood.  When asked how he is feeling, pt states "hard to tell right now, I've been sleeping a lot."  His goal is to "feel better."  Pt denies SI/HI, denies hallucinations, reports chronic back pain of 7/10.  Pt has remained in his room for the majority of the night.  Few interactions with peers.  He did not attend evening group.  A: Introduced self to pt.  Actively listened to pt and offered support and encouragement. Medication administered per order.  PRN medication administered for pain and sleep.  Heat packs provided for pain.  Fall prevention techniques reviewed with pt and pt verbalized understanding.  Q15 minute safety checks maintained.  R: Pt is safe on the unit.  Pt is compliant with medications.  Pt verbally contracts for safety.  Will continue to monitor and assess.

## 2017-11-18 NOTE — Clinical Social Work Note (Signed)
Clinical Social Work Assessment  Patient Details  Name: Michael [name] MRN: [id number] Date of Birth: 10/25/1950  Date of referral:  11/18/17               Reason for consult:  Housing Concerns/Homelessness                Permission sought to share information with:    Permission granted to share information::     Name::        Agency::     Relationship::     Contact Information:     Housing/Transportation Living arrangements for the past 2 months:  Hotel/Motel Source of Information:  Patient Patient Interpreter Needed:  None Criminal Activity/Legal Involvement Pertinent to Current Situation/Hospitalization:    Significant Relationships:  None Lives with:    Do you feel safe going back to the place where you live?    Need for family participation in patient care:     Care giving concerns: Patient admits he has been smoking crack for the past 6 days and has had chest discomfort since. Patient is currently homeless.    Social Worker assessment / plan:  CSW intern met with patient via bedside to discuss shelter resources. Patient states he currently has been living at a motel in [place] for about a week. Patient was residing at [facility] and wishes to return there once discharged.   CSW intern provided shelter resources and asked pt if he had any supports that could help him once discharged, pt answered, "no". However, pt felt comfortable navigating his way back to [place] independently.  Employment status:  Retired Nurse, adult PT Recommendations:    Information / Referral to community resources:  Shelter  Patient/Family's Response to care:  Patient was accepting to care being provided.  Patient/Family's Understanding of and Emotional Response to Diagnosis, Current Treatment, and Prognosis:  Patient was very pleasant and appreciative of interventions. Patient had a plan and was motivated to get back to [facility] in  [place].  Emotional Assessment Appearance:  Appears stated age Attitude/Demeanor/Rapport:    Affect (typically observed):  Accepting, Pleasant, Appropriate Orientation:  Oriented to Self, Oriented to Place, Oriented to  Time, Oriented to Situation Alcohol / Substance use:    Psych involvement (Current and /or in the community):  Yes (Comment)  Discharge Needs  Concerns to be addressed:  No discharge needs identified Readmission within the last 30 days:  No Current discharge risk:  None Barriers to Discharge:  No Barriers Identified   Willeen Niece, Student-Social Work 11/18/2017, 12:23 PM

## 2017-11-18 NOTE — ED Provider Notes (Signed)
[facility] DEPT Provider Note   CSN: [id number] Arrival date & time: 11/18/17  0109     History   Chief Complaint Chief Complaint  Patient presents with  . Addiction Problem  . SI    HPI Michael [name] is a 67 y.o. male.  The history is provided by the patient.  Depression  This is a chronic problem. The current episode started more than 1 week ago. The problem occurs constantly. The problem has been gradually worsening. Pertinent negatives include no abdominal pain, no headaches and no shortness of breath. Nothing aggravates the symptoms. Nothing relieves the symptoms. He has tried nothing for the symptoms. The treatment provided no relief.  Has been using crack cocaine for 6 days.  Denies AH or VH.  Thinks about overdosing on crack.  States he was clean for about 3 monthys.    Past Medical History:  Diagnosis Date  . A-fib ([place])   . Asthma    No PFTs, history of childhood asthma  . CAD (coronary artery disease)    a. 06/2013 STEMI/PCI ([facility]): LAD w/ thrombus (treated with BMS), mid 75%, D2 75%; LCX OM2 75%; RCA small, PDA 95%, PLV 95%;  b. 10/2013 Cath/PCI: ISR w/in LAD (Promus DES x 2), borderline OM2 lesion;  c. 01/2014 MV: Intermediate risk, medium-sized distal ant wall infarct w/ very small amt of peri-infarct ischemia. EF 60%.  . Cellulitis 04/2014   left facial  . Chondromalacia of medial femoral condyle    Left knee MRI 04/28/12: Chondromalacia of the medial femoral condyle with slight peripheral degeneration of the meniscocapsular junction of the medial meniscus; followed by sports medicine  . Collagen vascular disease ([place])   . Crack cocaine use    for 20+ years, has been enrolled in detox programs in the past  . Depression    with history of hospitalization for suicidal ideation  . Diabetes mellitus 2002   Diagnosed in 2002, started insulin in 2012  . Gout   . Gout 04/28/2012  . Headache(784.0)    CT head 08/2011: Periventricular and  subcortical white matter hypodensities are most in keeping with chronic microangiopathic change  . HIV infection [facility]) Nov 2012   Followed by [name]  . Hyperlipidemia   . Hypertension   . Pulmonary embolism [facility])     Patient Active Problem List   Diagnosis Date Noted  . Hepatitis C 12/30/2016  . Back pain 04/18/2016  . S/P carotid endarterectomy 11/15/2015  . MDD (major depressive disorder), recurrent severe, without psychosis ([place]) 09/09/2015  . Cocaine-induced mood disorder ([place]) 08/14/2015  . Gout 07/10/2015  . CKD (chronic kidney disease) stage 3, GFR 30-59 ml/min ([name]) 03/06/2015  . Normocytic anemia 03/06/2015  . Hypoglycemia   . Encounter for general adult medical examination with abnormal findings 02/09/2015  . Cocaine use disorder, severe, dependence ([place]) 12/13/2014  . Substance or medication-induced depressive disorder with onset during withdrawal ([place]) 12/13/2014  . Cervicalgia 06/28/2014  . Lumbar radiculopathy, chronic 06/28/2014  . Asthma, chronic 02/03/2014  . 3-vessel CAD 06/24/2013  . ED (erectile dysfunction) of organic origin 07/07/2012  . Hypertension goal BP (blood pressure) < 140/80 04/29/2012  . Chondromalacia of left knee 03/19/2012  . Hyperlipidemia with target LDL less than 100 02/12/2012  . Fibromyalgia 02/12/2012  . HIV (human immunodeficiency virus infection) ([place]) 08/27/2011  . Uncontrolled type 2 diabetes with neuropathy ([place]) 10/17/2000    Past Surgical History:  Procedure Laterality Date  . BACK SURGERY  1988  . BOWEL RESECTION    . CARDIAC SURGERY    . CERVICAL SPINE SURGERY     " rods in my neck "  . CORONARY STENT PLACEMENT    . NM MYOCAR PERF WALL MOTION  12/27/2011   normal  . SPINE SURGERY         Home Medications    Prior to Admission medications   Medication Sig Start Date End Date Taking? Authorizing Provider  ACCU-CHEK SOFTCLIX LANCETS lancets USE TO CHECK BLOOD GLUCOSE LEVELS UP TO 4 TIMES DAILY AS DIRECTED  01/31/17   [name], PA-C  benzonatate (TESSALON) 100 MG capsule Take 1 capsule (100 mg total) by mouth every 8 (eight) hours. 03/07/17   Avie Echevaria B, PA-C  blood glucose meter kit and supplies KIT Dispense based on patient and insurance preference. Use up to four times daily as directed. (FOR ICD-9 250.00, 250.01). 12/26/16   [name], PA-C  diltiazem (CARDIZEM CD) 120 MG 24 hr capsule Take 1 capsule (120 mg total) by mouth daily. 12/26/16   [name], PA-C  DULoxetine (CYMBALTA) 30 MG capsule Take 30 mg by mouth daily. 12/07/16   [provider]  emtricitabine-rilpivir-tenofovir AF (ODEFSEY) 200-25-25 MG TABS tablet Take 1 tablet by mouth daily with breakfast. 10/22/17   [name], MD  famotidine (PEPCID) 20 MG tablet TAKE 1 TABLET (20 MG TOTAL) BY MOUTH DAILY. 05/19/17   [name], PA-C  gabapentin (NEURONTIN) 400 MG capsule TAKE 1 CAPSULE (400 MG TOTAL) BY MOUTH THREE TIMES DAILY. 09/22/17   [name], PA-C  glipiZIDE (GLUCOTROL XL) 5 MG 24 hr tablet Take 5 mg by mouth daily. 12/07/16   [provider]  glucose blood (ACCU-CHEK AVIVA PLUS) test strip USE TO CHECK BLOOD GLUCOSE LEVELS UP TO 4 TIMES DAILY AS DIRECTED 01/31/17   [name], PA-C  hydrochlorothiazide (HYDRODIURIL) 12.5 MG tablet Take 1 tablet (12.5 mg total) by mouth daily. Patient not taking: Reported on 10/22/2017 12/26/16   [name], PA-C  hydrocortisone cream 1 % Apply to affected area 2 times daily Patient not taking: Reported on 01/09/2017 01/06/17   [place] Lanius A, PA-C  insulin lispro (HUMALOG) 100 UNIT/ML injection Inject 0.05 mLs (5 Units total) into the skin 3 (three) times daily before meals. Sliding scale as directed. 12/30/16   [name], PA-C  Insulin Syringe-Needle U-100 (B-D INS SYR MICROFINE 1CC/27G) 27G X 5/8" 1 ML MISC 1 strip by Does not apply route 3 (three) times daily as needed. 12/26/16   [name], PA-C    isosorbide mononitrate (IMDUR) 30 MG 24 hr tablet Take 1 tablet (30 mg total) by mouth daily. NEED OV. 03/06/17   Croitoru, Mihai, MD  LANTUS SOLOSTAR 100 UNIT/ML Solostar Pen INJECT 0.25MLS (25 UNITS TOTAL) SUBCUTANEOUSLY AT BEDTIME 10/09/17   [name], PA-C  losartan (COZAAR) 50 MG tablet Take 1 tablet (50 mg total) by mouth daily. 12/26/16   [name], PA-C  metFORMIN (GLUCOPHAGE) 500 MG tablet TAKE TWO TABLETS (1,000 MG TOTAL) BY MOUTH TWO TIMES DAILY BEFORE A MEAL. 06/25/17   [name], PA-C  pravastatin (PRAVACHOL) 40 MG tablet Take 1 tablet (40 mg total) by mouth at bedtime. 12/26/16   [name], PA-C  PROAIR HFA 108 [phone] Base) MCG/ACT inhaler INHALE TWO PUFFS INTO THE LUNGS EVERY 6 (SIX) HOURS AS NEEDED FOR WHEEZING OR SHORTNESS OF BREATH. 11/06/17   [name],  MD  Rivaroxaban (XARELTO) 15 MG TABS tablet Take 15 mg by mouth daily.    [provider]  Rivaroxaban 15 & 20 MG TBPK Take as directed on package: Start with one 86m tablet by mouth twice a day with food. On Day 22, switch to one 282mtablet once a day with food. Patient not taking: Reported on 03/07/2017 12/26/16   GoClent DemarkPA-C  sertraline (ZOLOFT) 50 MG tablet Take 1 tablet (50 mg total) by mouth daily. 01/10/17   LoPatrecia PourNP    Family History Family History  Problem Relation Age of Onset  . Diabetes Mother   . Hypertension Mother   . Hyperlipidemia Mother   . Diabetes Father   . Cancer Father   . Hypertension Father   . Diabetes Brother   . Heart disease Brother   . Diabetes Sister   . Colon cancer Neg Hx     Social History Social History   Tobacco Use  . Smoking status: Never Smoker  . Smokeless tobacco: Never Used  Substance Use Topics  . Alcohol use: No    Alcohol/week: 2.4 oz    Types: 2 Cans of beer, 2 Shots of liquor per week  . Drug use: Yes    Types: "Crack" cocaine, Cocaine    Comment: last use January 2018     Allergies   Patient  has no known allergies.   Review of Systems Review of Systems  Respiratory: Negative for shortness of breath.   Cardiovascular: Negative for palpitations and leg swelling.  Gastrointestinal: Negative for abdominal pain.  Neurological: Negative for headaches.  Psychiatric/Behavioral: Positive for depression, dysphoric mood and suicidal ideas.  All other systems reviewed and are negative.    Physical Exam Updated Vital Signs There were no vitals taken for this visit.  Physical Exam  Constitutional: He is oriented to person, place, and time. He appears well-developed and well-nourished. No distress.  HENT:  Head: Normocephalic and atraumatic.  Mouth/Throat: No oropharyngeal exudate.  Eyes: Conjunctivae are normal. Pupils are equal, round, and reactive to light.  Neck: Normal range of motion. Neck supple.  Cardiovascular: Normal rate, regular rhythm, normal heart sounds and intact distal pulses.  Pulmonary/Chest: Effort normal and breath sounds normal. No stridor. He has no wheezes. He has no rales.  Abdominal: Soft. Bowel sounds are normal. He exhibits no mass. There is no tenderness. There is no rebound and no guarding.  Musculoskeletal: Normal range of motion. He exhibits no edema.  Neurological: He is alert and oriented to person, place, and time.  Skin: Skin is warm and dry. Capillary refill takes less than 2 seconds.  Psychiatric: His affect is blunt. He expresses no homicidal ideation. He expresses no homicidal plans.     ED Treatments / Results  Labs (all labs ordered are listed, but only abnormal results are displayed)  Results for orders placed or performed during the hospital encounter of 11/18/17  Comprehensive metabolic panel  Result Value Ref Range   Sodium 140 135 - 145 mmol/L   Potassium 3.7 3.5 - 5.1 mmol/L   Chloride 108 101 - 111 mmol/L   CO2 21 (L) 22 - 32 mmol/L   Glucose, Bld 187 (H) 65 - 99 mg/dL   BUN 23 (H) 6 - 20 mg/dL   Creatinine, Ser 1.58 (H)  0.61 - 1.24 mg/dL   Calcium 8.3 (L) 8.9 - 10.3 mg/dL   Total Protein 6.6 6.5 - 8.1 g/dL   Albumin 3.6 3.5 -  5.0 g/dL   AST 42 (H) 15 - 41 U/L   ALT 35 17 - 63 U/L   Alkaline Phosphatase 63 38 - 126 U/L   Total Bilirubin 0.8 0.3 - 1.2 mg/dL   GFR calc non Af Amer 44 (L) >60 mL/min   GFR calc Af Amer 51 (L) >60 mL/min   Anion gap 11 5 - 15  Salicylate level  Result Value Ref Range   Salicylate Lvl <4.6 2.8 - 30.0 mg/dL  Acetaminophen level  Result Value Ref Range   Acetaminophen (Tylenol), Serum <10 (L) 10 - 30 ug/mL  Ethanol  Result Value Ref Range   Alcohol, Ethyl (B) <10 <10 mg/dL  CBC WITH DIFFERENTIAL  Result Value Ref Range   WBC 5.4 4.0 - 10.5 K/uL   RBC 3.21 (L) 4.22 - 5.81 MIL/uL   Hemoglobin 9.3 (L) 13.0 - 17.0 g/dL   HCT 26.8 (L) 39.0 - 52.0 %   MCV 83.5 78.0 - 100.0 fL   MCH 29.0 26.0 - 34.0 pg   MCHC 34.7 30.0 - 36.0 g/dL   RDW 14.1 11.5 - 15.5 %   Platelets 130 (L) 150 - 400 K/uL   Neutrophils Relative % 64 %   Neutro Abs 3.4 1.7 - 7.7 K/uL   Lymphocytes Relative 27 %   Lymphs Abs 1.4 0.7 - 4.0 K/uL   Monocytes Relative 8 %   Monocytes Absolute 0.5 0.1 - 1.0 K/uL   Eosinophils Relative 1 %   Eosinophils Absolute 0.1 0.0 - 0.7 K/uL   Basophils Relative 0 %   Basophils Absolute 0.0 0.0 - 0.1 K/uL  CBG monitoring, ED  Result Value Ref Range   Glucose-Capillary 174 (H) 65 - 99 mg/dL  I-stat troponin, ED  Result Value Ref Range   Troponin i, poc 0.01 0.00 - 0.08 ng/mL   Comment 3           No results found.  EKG  EKG Interpretation  Date/Time:  Tuesday November 18 2017 01:49:00 EST Ventricular Rate:  71 PR Interval:  150 QRS Duration: 84 QT Interval:  392 QTC Calculation: 425 R Axis:   66 Text Interpretation:  normal sinus rhythm with PACs Reconfirmed by Randal Buba, Zilah Villaflor ([id number]) on 11/18/2017 1:56:44 AM        Procedures Procedures (including critical care time)  Medications Ordered in ED  Medications  acetaminophen (TYLENOL) tablet 650 mg  (not administered)  ondansetron (ZOFRAN) tablet 4 mg (not administered)  alum & mag hydroxide-simeth (MAALOX/MYLANTA) 200-200-20 MG/5ML suspension 30 mL (not administered)  nicotine (NICODERM CQ - dosed in mg/24 hours) patch 21 mg (not administered)  emtricitabine-rilpivir-tenofovir AF (ODEFSEY) 200-25-25 MG per tablet 1 tablet (not administered)  famotidine (PEPCID) tablet 20 mg (not administered)  gabapentin (NEURONTIN) capsule 400 mg (not administered)  losartan (COZAAR) tablet 50 mg (not administered)      Final Clinical Impressions(s) / ED Diagnoses   Medically cleared by me for TTS, holding orders placed   Muskaan Smet, MD 11/18/17 8032

## 2017-11-18 NOTE — BH Assessment (Addendum)
Assessment Note  Michael [name] is an 67 y.o. male.  -Clinician reviewed note by Dr. Randal Buba.  Has been using crack cocaine for 6 days.  Denies AH or VH.  Thinks about overdosing on crack.  States he was clean for about 3 months.  Patient says he called EMS because he was having thoughts of killing himself.  He says he would overdose on crack as a method to kill self.  Patient has other medication that he takes however.  Patient has had one previous suicide attempt.  Patient says he does not want to hurt anyone else, just himself.  He hears voices that tell him bad things and he sometimes sees "little men."  Patient has been on a crack cocaine binge for the last 6 days.  He says he was 3 months clean prior to this.  Patient says he has gone through what savings he had scraped together.  Patient says he has been feeling depressed and irritable a lot lately.  His ex-wife died about a year ago.  He is currently homeless.  He had been staying at the rescue mission in [place] until about a week ago.  He has had a poor appetite because he has been drugging instead of eating.  Poor sleep, getting less than 4 hours per day.  Patient has no outpatient provider at this time.  He was in [place] roughly two years ago.  -Clinician discussed patient care with Patriciaann Clan, PA who recommends patient be seen by psychiatry in AM.  Clinician talked with Dr. Randal Buba and she is in agreement with that disposition.  Diagnosis: F14.20 Cocaine use d/o severe  Past Medical History:  Past Medical History:  Diagnosis Date  . A-fib ([place])   . Asthma    No PFTs, history of childhood asthma  . CAD (coronary artery disease)    a. 06/2013 STEMI/PCI ([facility]): LAD w/ thrombus (treated with BMS), mid 75%, D2 75%; LCX OM2 75%; RCA small, PDA 95%, PLV 95%;  b. 10/2013 Cath/PCI: ISR w/in LAD (Promus DES x 2), borderline OM2 lesion;  c. 01/2014 MV: Intermediate risk, medium-sized distal ant wall infarct w/ very small amt of  peri-infarct ischemia. EF 60%.  . Cellulitis 04/2014   left facial  . Chondromalacia of medial femoral condyle    Left knee MRI 04/28/12: Chondromalacia of the medial femoral condyle with slight peripheral degeneration of the meniscocapsular junction of the medial meniscus; followed by sports medicine  . Collagen vascular disease ([place])   . Crack cocaine use    for 20+ years, has been enrolled in detox programs in the past  . Depression    with history of hospitalization for suicidal ideation  . Diabetes mellitus 2002   Diagnosed in 2002, started insulin in 2012  . Gout   . Gout 04/28/2012  . Headache(784.0)    CT head 08/2011: Periventricular and subcortical white matter hypodensities are most in keeping with chronic microangiopathic change  . HIV infection [facility]) Nov 2012   Followed by [name]  . Hyperlipidemia   . Hypertension   . Pulmonary embolism [facility])     Past Surgical History:  Procedure Laterality Date  . BACK SURGERY     1988  . BOWEL RESECTION    . CARDIAC SURGERY    . CERVICAL SPINE SURGERY     " rods in my neck "  . CORONARY STENT PLACEMENT    . NM MYOCAR PERF WALL MOTION  12/27/2011   normal  .  SPINE SURGERY      Family History:  Family History  Problem Relation Age of Onset  . Diabetes Mother   . Hypertension Mother   . Hyperlipidemia Mother   . Diabetes Father   . Cancer Father   . Hypertension Father   . Diabetes Brother   . Heart disease Brother   . Diabetes Sister   . Colon cancer Neg Hx     Social History:  reports that  has never smoked. he has never used smokeless tobacco. He reports that he uses drugs. Drugs: "Crack" cocaine and Cocaine. He reports that he does not drink alcohol.  Additional Social History:  Alcohol / Drug Use Pain Medications: See PTA medication list Prescriptions: See PTA medication list Over the Counter: See PTA medication list History of alcohol / drug use?: Yes Substance #1 Name of Substance 1: Cocaine (crack) 1 -  Age of First Use: 40's 1 - Amount (size/oz): Cannot quantify 1 - Frequency: Daily 1 - Duration: Daily for the last 6 days 1 - Last Use / Amount: 11-17-17  CIWA: CIWA-Ar BP: (!) 146/79 Pulse Rate: 87 COWS:    Allergies: No Known Allergies  Home Medications:  (Not in a hospital admission)  OB/GYN Status:  No LMP for male patient.  General Assessment Data Location of Assessment: [facility] ED TTS Assessment: In system Is this a Tele or Face-to-Face Assessment?: Face-to-Face Is this an Initial Assessment or a Re-assessment for this encounter?: Initial Assessment Marital status: Divorced Is patient pregnant?: No Pregnancy Status: No Living Arrangements: Other (Comment)(Pt is currently homeless) Can pt return to current living arrangement?: Yes Admission Status: Voluntary Is patient capable of signing voluntary admission?: Yes Referral Source: Self/Family/Friend(Pt called EMS.) Insurance type: [facility]     Crisis Care Plan Living Arrangements: Other (Comment)(Pt is currently homeless) Name of Psychiatrist: None Name of Therapist: None  Education Status Is patient currently in school?: No Highest grade of school patient has completed: 12th grade  Risk to self with the past 6 months Suicidal Ideation: Yes-Currently Present Has patient been a risk to self within the past 6 months prior to admission? : Yes Suicidal Intent: Yes-Currently Present Has patient had any suicidal intent within the past 6 months prior to admission? : Yes Is patient at risk for suicide?: Yes Suicidal Plan?: Yes-Currently Present Has patient had any suicidal plan within the past 6 months prior to admission? : Yes Specify Current Suicidal Plan: Overdose on crack Access to Means: Yes Specify Access to Suicidal Means: off the street What has been your use of drugs/alcohol within the last 12 months?: Crack Previous Attempts/Gestures: Yes How many times?: 1 Other Self Harm Risks: None Triggers for Past  Attempts: Other (Comment)(Pt says "stress") Intentional Self Injurious Behavior: None Family Suicide History: No Recent stressful life event(s): Loss (Comment), Other (Comment)(Ex wife died year ago; homeless) Persecutory voices/beliefs?: No Depression: Yes Depression Symptoms: Despondent, Feeling angry/irritable, Isolating, Insomnia, Guilt, Feeling worthless/self pity Substance abuse history and/or treatment for substance abuse?: Yes Suicide prevention information given to non-admitted patients: Not applicable  Risk to Others within the past 6 months Homicidal Ideation: No Does patient have any lifetime risk of violence toward others beyond the six months prior to admission? : No Thoughts of Harm to Others: No Current Homicidal Intent: No Current Homicidal Plan: No Access to Homicidal Means: No Identified Victim: No one History of harm to others?: No Assessment of Violence: None Noted Violent Behavior Description: None reported Does patient have access to weapons?:  No Criminal Charges Pending?: No Does patient have a court date: No Is patient on probation?: No  Psychosis Hallucinations: Auditory, Visual(Voices tell him he is bad; sees little men.) Delusions: None noted  Mental Status Report Appearance/Hygiene: Unremarkable Eye Contact: Fair Motor Activity: Freedom of movement, Unremarkable Speech: Logical/coherent, Soft Level of Consciousness: Alert Mood: Depressed, Anxious, Despair, Helpless, Sad Affect: Depressed, Blunted, Sad Anxiety Level: Panic Attacks Panic attack frequency: Once in a month Most recent panic attack: Cannot recall Thought Processes: Coherent, Relevant Judgement: Impaired Orientation: Person, Place, Situation, Time Obsessive Compulsive Thoughts/Behaviors: None  Cognitive Functioning Concentration: Decreased Memory: Recent Impaired, Remote Intact IQ: Average Insight: Fair Impulse Control: Poor Appetite: Poor Weight Loss: (Drugging instead of  eating lately) Weight Gain: 0 Sleep: Decreased Total Hours of Sleep: (<4H/D) Vegetative Symptoms: None  ADLScreening [facility] Assessment Services) Patient's cognitive ability adequate to safely complete daily activities?: Yes Patient able to express need for assistance with ADLs?: Yes Independently performs ADLs?: Yes (appropriate for developmental age)  Prior Inpatient Therapy Prior Inpatient Therapy: Yes Prior Therapy Dates: Two years ago Prior Therapy Facilty/Provider(s): [facility] Reason for Treatment: detox / rehab  Prior Outpatient Therapy Prior Outpatient Therapy: No Prior Therapy Dates: None Prior Therapy Facilty/Provider(s): N/A Reason for Treatment: N/A Does patient have an ACCT team?: No Does patient have Intensive In-House Services?  : No Does patient have [facility] services? : No Does patient have P4CC services?: No  ADL Screening (condition at time of admission) Patient's cognitive ability adequate to safely complete daily activities?: Yes Is the patient deaf or have difficulty hearing?: No Does the patient have difficulty seeing, even when wearing glasses/contacts?: Yes(Uses reading glasses) Does the patient have difficulty concentrating, remembering, or making decisions?: Yes Patient able to express need for assistance with ADLs?: Yes Does the patient have difficulty dressing or bathing?: No Independently performs ADLs?: Yes (appropriate for developmental age) Does the patient have difficulty walking or climbing stairs?: Yes Weakness of Legs: Left(Pt uses a cain.) Weakness of Arms/Hands: None  Home Assistive Devices/Equipment Home Assistive Devices/Equipment: Cane (specify quad or straight)    Abuse/Neglect Assessment (Assessment to be complete while patient is alone) Abuse/Neglect Assessment Can Be Completed: Yes Physical Abuse: Denies Verbal Abuse: Denies Sexual Abuse: Denies Exploitation of patient/patient's resources: Denies Self-Neglect: Denies      Regulatory affairs officer (For Healthcare) Does Patient Have a Medical Advance Directive?: No Would patient like information on creating a medical advance directive?: No - Patient declined    Additional Information 1:1 In Past 12 Months?: No CIRT Risk: No Elopement Risk: No Does patient have medical clearance?: Yes     Disposition:  Disposition Initial Assessment Completed for this Encounter: Yes Disposition of Patient: Other dispositions Other disposition(s): Other (Comment)(Pt to be reviewed with PA)  On Site Evaluation by:   Reviewed with Physician:    Raymondo Band 11/18/2017 5:56 AM

## 2017-11-18 NOTE — Progress Notes (Signed)
This patient continues to meet inpatient criteria. CSW fax information to the following facilities:   [place], [place] [facility] Clinical Social Worker [phone]

## 2017-11-18 NOTE — ED Notes (Signed)
Pt discharged safely with [place] driver.  Pt was in no distress.  All belongings were returned to pt.

## 2017-11-18 NOTE — Progress Notes (Signed)
Recreation Therapy Notes  Animal-Assisted Activity (AAA) Program Checklist/Progress Notes Patient Eligibility Criteria Checklist & Daily Group note for Rec TxIntervention  Date: 2.12.19 Time: 2:45 pm  Location: [address]   AAA/T Program Assumption of Risk Form signed by Patient/ or Parent Legal Guardian Yes  Patient is free of allergies or sever asthma Yes  Patient reports no fear of animals Yes  Patient reports no history of cruelty to animals Yes  Patient understands his/her participation is voluntary Yes  Behavioral Response: Did not attend   Ranell Patrick, Recreation Therapy Intern  Ranell Patrick 11/18/2017 3:35 PM

## 2017-11-18 NOTE — BHH Counselor (Signed)
Pt accepted to [facility] 303-2 per [name] [facility]. He may arrive after 1300. Report # [phone].   Bedelia Person, [place], [facility] Lead Triage Specialist  [facility]  Therapeutic Triage Services Phone: [phone] Fax: [phone]

## 2017-11-18 NOTE — Progress Notes (Signed)
Michael [name] is a 67 year old male being admitted voluntarily to [facility] from [facility].  He came to the ED for suicidal ideation with plan to OD on crack cocaine.  He reported his stressors as being homeless, no supports and medical issues.  During [facility] admission, he was pleasant and cooperative.  Alert and oriented x 3.  Affect depressed.  He continues to voice suicidal ideation but will contract for safety on the unit.  He denies A/V hallucinations or HI.  He did report hearing voices when arriving at the ED but none currently.  He reported multiple medical problems but was unable to explain what heart conditions that he has or what medications that he takes on a regular basis.  Oriented him to the unit.  Admission paperwork completed and signed.  Belongings searched and secured in locker # 68, no contraband found.  Skin assessment completed and no skin issues noted.  Q 15 minute checks initiated for safety.  We will continue to monitor the progress towards his goals.

## 2017-11-18 NOTE — ED Triage Notes (Signed)
Patient BIB EMS from home. Patient admits he has been smoking crack for the past 6 days and has had chest discomfort since. Patient states he last smoked it 1 hour ago. Patient states he has been suicidal.

## 2017-11-18 NOTE — BHH Group Notes (Signed)
[facility] Group Notes:  (Nursing/MHT/Case Management/Adjunct)  Date:  11/18/2017  Time:  3:30 p.m.  Type of Therapy:  Psychoeducational Skills  Participation Level:  Active  Participation Quality:  Appropriate  Affect:  Appropriate  Cognitive:  Appropriate  Insight:  Appropriate  Engagement in Group:  Engaged and Supportive  Modes of Intervention:  Activity and Education  Summary of Progress/Problems:  This is a psychoeducational group especially related to the therapy ball.   Cammy Copa 11/18/2017, 4:50 PM

## 2017-11-18 NOTE — Tx Team (Signed)
Initial Treatment Plan 11/18/2017 4:28 PM [name] JDY:[id number]    PATIENT STRESSORS: Financial difficulties Health problems Substance abuse Other: Homeless   PATIENT STRENGTHS: Curator fund of knowledge Motivation for treatment/growth   PATIENT IDENTIFIED PROBLEMS: Depression  Suicidal ideation  Substance abuse  "Get better"  "Live a good life"             DISCHARGE CRITERIA:  Improved stabilization in mood, thinking, and/or behavior Verbal commitment to aftercare and medication compliance  PRELIMINARY DISCHARGE PLAN: Outpatient therapy Medication management  PATIENT/FAMILY INVOLVEMENT: This treatment plan has been presented to and reviewed with the patient, Michael [name].  The patient and family have been given the opportunity to ask questions and make suggestions.  Windell Moment, RN 11/18/2017, 4:28 PM

## 2017-11-19 DIAGNOSIS — F1420 Cocaine dependence, uncomplicated: Secondary | ICD-10-CM

## 2017-11-19 LAB — GLUCOSE, CAPILLARY
GLUCOSE-CAPILLARY: 300 mg/dL — AB (ref 65–99)
Glucose-Capillary: 109 mg/dL — ABNORMAL HIGH (ref 65–99)
Glucose-Capillary: 237 mg/dL — ABNORMAL HIGH (ref 65–99)
Glucose-Capillary: 113 mg/dL — ABNORMAL HIGH (ref 65–99)

## 2017-11-19 MED ORDER — HYDROCHLOROTHIAZIDE 12.5 MG PO CAPS
12.5000 mg | ORAL_CAPSULE | Freq: Every day | ORAL | Status: DC
  Administered 2017-11-19 – 2017-11-24 (×6): 12.5 mg via ORAL
  Filled 2017-11-19 (×8): qty 1

## 2017-11-19 MED ORDER — PRAVASTATIN SODIUM 40 MG PO TABS
40.0000 mg | ORAL_TABLET | Freq: Every day | ORAL | Status: DC
  Administered 2017-11-19 – 2017-11-23 (×5): 40 mg via ORAL
  Filled 2017-11-19 (×6): qty 1

## 2017-11-19 MED ORDER — SERTRALINE HCL 50 MG PO TABS
50.0000 mg | ORAL_TABLET | Freq: Every day | ORAL | Status: DC
  Administered 2017-11-19 – 2017-11-24 (×6): 50 mg via ORAL
  Filled 2017-11-19 (×8): qty 1

## 2017-11-19 MED ORDER — ISOSORBIDE MONONITRATE ER 30 MG PO TB24
30.0000 mg | ORAL_TABLET | Freq: Every day | ORAL | Status: DC
  Administered 2017-11-19 – 2017-11-24 (×6): 30 mg via ORAL
  Filled 2017-11-19 (×8): qty 1

## 2017-11-19 MED ORDER — ALLOPURINOL 300 MG PO TABS
300.0000 mg | ORAL_TABLET | Freq: Every day | ORAL | Status: DC
  Administered 2017-11-19 – 2017-11-24 (×6): 300 mg via ORAL
  Filled 2017-11-19 (×7): qty 1

## 2017-11-19 NOTE — BHH Suicide Risk Assessment (Signed)
[facility] Admission Suicide Risk Assessment   Nursing information obtained from:  Patient Demographic factors:  Male, Low socioeconomic status, Divorced or widowed, Living alone Current Mental Status:  Suicidal ideation indicated by patient Loss Factors:  Decline in physical health, Financial problems / change in socioeconomic status Historical Factors:  Prior suicide attempts Risk Reduction Factors:  NA  Total Time spent with patient: 1 hour Principal Problem: Severe recurrent major depressive disorder with psychotic features ([place]) Diagnosis:   Patient Active Problem List   Diagnosis Date Noted  . Hepatitis C [B19.20] 12/30/2016  . Back pain [M54.9] 04/18/2016  . S/P carotid endarterectomy [Z98.890] 11/15/2015  . MDD (major depressive disorder), recurrent severe, without psychosis ([place]) [F33.2] 09/09/2015  . Cocaine-induced mood disorder ([place]) [F14.94] 08/14/2015  . Cocaine abuse with cocaine-induced mood disorder ([place]) [F14.14] 08/14/2015  . Gout [M10.9] 07/10/2015  . CKD (chronic kidney disease) stage 3, GFR 30-59 ml/min ([name]) [N18.3] 03/06/2015  . Normocytic anemia [D64.9] 03/06/2015  . Hypoglycemia [E16.2]   . Encounter for general adult medical examination with abnormal findings [Z00.01] 02/09/2015  . Cocaine use disorder, severe, dependence ([place]) [F14.20] 12/13/2014  . Substance or medication-induced depressive disorder with onset during withdrawal [facility]) [E33.295, F19.24, F32.89] 12/13/2014  . Severe recurrent major depressive disorder with psychotic features ([place]) [F33.3] 12/12/2014  . Cervicalgia [M54.2] 06/28/2014  . Lumbar radiculopathy, chronic [M54.16] 06/28/2014  . Asthma, chronic [J45.909] 02/03/2014  . 3-vessel CAD [I25.10] 06/24/2013  . ED (erectile dysfunction) of organic origin [N52.9] 07/07/2012  . Hypertension goal BP (blood pressure) < 140/80 [I10] 04/29/2012  . Chondromalacia of left knee [M94.262] 03/19/2012  . Hyperlipidemia with target LDL less than 100 [E78.5]  02/12/2012  . Fibromyalgia [M79.7] 02/12/2012  . HIV (human immunodeficiency virus infection) ([place]) [B20] 08/27/2011  . Uncontrolled type 2 diabetes with neuropathy ([place]) [E11.40, E11.65] 10/17/2000   Subjective Data:   Michael [name] is a 67 y/o M with history of MDD, cocaine use disorder, and multiple medical problems including recent MI, HIV, DMII, and A Fib who presented voluntarily with worsening symptoms of depression, SI to overdose on crack cocaine, AH, and worsening substance use of cocaine. Pt also reported being off of his medications for over a month.  Upon initial evaluation, pt shares, "I smoked some crack cocaine and then things just got really bad." Pt shares that after having maintained sobriety for 3 months, he recently had [facility] [place] and then "started hanging with the wrong people." Pt shares that he had relapse of use of cocaine and he used continuously while he was awake for a period of six days. This resulted in having AH, developing anxiety and depression, and then having SI with plan to overdose on cocaine. Pt then called emergency services for help. He reports AH were only in the context of use of cocaine. He denies current SI/HI/AH/VH. He has poor sleep outside the hospital, depressed mood, anxiety, guilty feelings, low energy, poor concentration, and fair appetite. He denies symptoms of mania, OCD, and PTSD. He denies illicit substance use aside from cocaine.  Discussed with patient about treatment options. He reports that previously he had done well on zoloft, and he would like to resume that medication. Pt also shares that he would like to stay at the [facility] after discharge. He has plans to go through with his scheduled heart surgery in [place] on March 5, and pt was advised to speak with his surgeon regarding recent use of cocaine as this could  affect his heart health and suitability for surgery, and pt verbalized good  understanding. Pt agreed to be restarted on previous outpatient medications for his medical concerns. He had no further questions, comments, concerns.   Continued Clinical Symptoms:  Alcohol Use Disorder Identification Test Final Score (AUDIT): 0 The "Alcohol Use Disorders Identification Test", Guidelines for Use in Primary Care, Second Edition.  World Pharmacologist [facility]). Score between 0-7:  no or low risk or alcohol related problems. Score between 8-15:  moderate risk of alcohol related problems. Score between 16-19:  high risk of alcohol related problems. Score 20 or above:  warrants further diagnostic evaluation for alcohol dependence and treatment.   CLINICAL FACTORS:   Severe Anxiety and/or Agitation Depression:   Comorbid alcohol abuse/dependence Impulsivity Severe Alcohol/Substance Abuse/Dependencies More than one psychiatric diagnosis Unstable or Poor Therapeutic Relationship Previous Psychiatric Diagnoses and Treatments Medical Diagnoses and Treatments/Surgeries   Musculoskeletal: Strength & Muscle Tone: within normal limits Gait & Station: normal Patient leans: N/A  Psychiatric Specialty Exam: Physical Exam  Nursing note and vitals reviewed.   Review of Systems  Constitutional: Negative for chills and fever.  Respiratory: Negative for cough and shortness of breath.   Cardiovascular: Negative for chest pain.  Gastrointestinal: Negative for abdominal pain, diarrhea, heartburn, nausea and vomiting.  Psychiatric/Behavioral: Positive for depression, hallucinations, substance abuse and suicidal ideas. The patient is nervous/anxious.     Blood pressure 132/69, pulse 60, temperature 97.6 F (36.4 C), temperature source Oral, resp. rate 18, height 5' 5.35" (1.66 m), weight 81.1 kg (178 lb 12.8 oz).Body mass index is 29.43 kg/m.  General Appearance: Casual and Fairly Groomed  Eye Contact:  Good  Speech:  Clear and Coherent and Normal Rate  Volume:  Normal  Mood:   Anxious and Depressed  Affect:  Appropriate, Congruent and Constricted  Thought Process:  Coherent and Goal Directed  Orientation:  Full (Time, Place, and Person)  Thought Content:  Logical  Suicidal Thoughts:  Yes.  with intent/plan  Homicidal Thoughts:  No  Memory:  Immediate;   Fair Recent;   Fair Remote;   Fair  Judgement:  Fair  Insight:  Fair  Psychomotor Activity:  Normal  Concentration:  Concentration: Fair  Recall:  [employer] of Knowledge:  Fair  Language:  Fair  Akathisia:  No  Handed:    AIMS (if indicated):     Assets:  Armed forces logistics/support/administrative officer Physical Health Resilience Social Support  ADL's:  Intact  Cognition:  WNL  Sleep:  Number of Hours: 6.75      COGNITIVE FEATURES THAT CONTRIBUTE TO RISK:  None    SUICIDE RISK:   Minimal: No identifiable suicidal ideation.  Patients presenting with no risk factors but with morbid ruminations; may be classified as minimal risk based on the severity of the depressive symptoms  PLAN OF CARE:   - Admit to inpatient level of care  -MDD   -Start zoloft 50mg  po qDay  - Anxiety   -Continue atarax 25mg  po TID prn anxiety   - Continue gabapentin 400mg  po BID  -Insomnia    - Continue trazodone 50mg  po qhs prn insomnia  -HLD   - Continue pravastatin  -DMII  - Continue metformin, SSI, HCTZ  -HTN   - Continue losartan, Imdur  - Continue all other home medications without changes  - Encourage participation in groups and the therapeutic milieu  -Discharge planning will be ongoing  I certify that inpatient services furnished can reasonably be expected to improve the  patient's condition.   Pennelope Bracken, MD 11/19/2017, 5:06 PM

## 2017-11-19 NOTE — Progress Notes (Signed)
Adult Psychoeducational Group Note  Date:  11/19/2017 Time:  1:19 PM  Group Topic/Focus:  Wellness Toolbox:   The focus of this group is to discuss various aspects of wellness, balancing those aspects and exploring ways to increase the ability to experience wellness.  Patients will create a wellness toolbox for use upon discharge.  Participation Level:  Active  Participation Quality:  Appropriate and Attentive  Affect:  Appropriate  Cognitive:  Alert and Appropriate  Insight: Appropriate, Good and Improving  Engagement in Group:  Engaged  Modes of Intervention:  Activity and Discussion  Additional Comments:  Pt participated in both group activity and discussion today.  Onelia Cadmus R Isabella Roemmich 11/19/2017, 1:19 PM

## 2017-11-19 NOTE — Progress Notes (Signed)
Recreation Therapy Notes  Date: 11/19/17 Time: 0930 Location: 300 Hall Dayroom  Group Topic: Stress Management  Goal Area(s) Addresses:  Patient will verbalize importance of using healthy stress management.  Patient will identify positive emotions associated with healthy stress management.   Intervention: Stress Management  Activity :  LRT introduced the stress management technique of guided imagery.  LRT read a script to guide patients to envision their peaceful place. Patients were to follow along as LRT read script.  Education:  Stress Management, Discharge Planning.   Education Outcome: Acknowledges edcuation/In group clarification offered/Needs additional education  Clinical Observations/Feedback: Pt did not attend group.    Victorino Sparrow, LRT/CTRS         Victorino Sparrow A 11/19/2017 11:32 AM

## 2017-11-19 NOTE — Plan of Care (Signed)
Patient verbalizes understanding of information, education provided. 

## 2017-11-19 NOTE — Progress Notes (Signed)
Patient ID: Michael [name], male   DOB: January 01, 1951, 67 y.o.   MRN: [id number]  D: Patient in room resting on approach. Pt reports he is doing "okay". Pt mood and affect appeared depressed and flat. Pt interaction is minimal and forward little to Probation officer. Pt disclosed he concerned about his drug use and upcoming heart surgery. Pt reports he is tolerating medication well. Pt denies SI/HI/AVH and pain. Cooperative with assessment.    A: Medications administered as prescribed. Support and encouragement provided as needed. Pt encouraged to discuss feelings and come to staff with any question or concerns.   R: Patient remains safe and complaint with medications.

## 2017-11-19 NOTE — BHH Group Notes (Signed)
LCSW Group Therapy Note   11/19/2017 1:15pm   Type of Therapy and Topic:  Group Therapy:  Overcoming Obstacles   Participation Level:  Minimal   Description of Group:    In this group patients will be encouraged to explore what they see as obstacles to their own wellness and recovery. They will be guided to discuss their thoughts, feelings, and behaviors related to these obstacles. The group will process together ways to cope with barriers, with attention given to specific choices patients can make. Each patient will be challenged to identify changes they are motivated to make in order to overcome their obstacles. This group will be process-oriented, with patients participating in exploration of their own experiences as well as giving and receiving support and challenge from other group members.   Therapeutic Goals: 1. Patient will identify personal and current obstacles as they relate to admission. 2. Patient will identify barriers that currently interfere with their wellness or overcoming obstacles.  3. Patient will identify feelings, thought process and behaviors related to these barriers. 4. Patient will identify two changes they are willing to make to overcome these obstacles:      Summary of Patient Progress   Michael [name] was attentive and engaged during today's processing group. He shared that his biggest obstacle involves finding stable housing and maintaining his sobriety. He was excited to share that he plans to attend [facility] on Monday and just got off the phone with the admissions office. "I have my ID and I am ready to go by Monday." He continues to show progress in the group setting with improving insight.    Therapeutic Modalities:   Cognitive Behavioral Therapy Solution Focused Therapy Motivational Interviewing Relapse Prevention Therapy  Kimber Relic Smart, LCSW 11/19/2017 12:40 PM

## 2017-11-19 NOTE — BHH Suicide Risk Assessment (Signed)
[facility] INPATIENT:  Family/Significant Other Suicide Prevention Education  Suicide Prevention Education:  Patient Refusal for Family/Significant Other Suicide Prevention Education: The patient Michael [name] has refused to provide written consent for family/significant other to be provided Family/Significant Other Suicide Prevention Education during admission and/or prior to discharge.  Physician notified.  Frutoso Chase Uri Turnbough 11/19/2017, 10:40 AM

## 2017-11-19 NOTE — BHH Group Notes (Signed)
Pt was invited but did not attend group.

## 2017-11-19 NOTE — Progress Notes (Signed)
Patient did attend the evening speaker NA meeting.  
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
## 2017-11-19 NOTE — H&P (Signed)
Psychiatric Admission Assessment Adult  Patient Identification: Michael [name]  MRN:  [id number]  Date of Evaluation:  11/19/2017  Chief Complaint: Worsening depression, suicidal thoughts, increased cocaine use.   Principal Diagnosis: Severe recurrent major depressive disorder with psychotic features ([place])  Diagnosis:   Patient Active Problem List   Diagnosis Date Noted  . Severe recurrent major depressive disorder with psychotic features ([place]) [F33.3] 12/12/2014    Priority: High  . Cocaine abuse with cocaine-induced mood disorder [name]) [F14.14] 08/14/2015    Priority: Medium  . Cocaine use disorder, severe, dependence ([place]) [F14.20] 12/13/2014    Priority: Medium  . Hepatitis C [B19.20] 12/30/2016  . Back pain [M54.9] 04/18/2016  . S/P carotid endarterectomy [Z98.890] 11/15/2015  . MDD (major depressive disorder), recurrent severe, without psychosis ([place]) [F33.2] 09/09/2015  . Cocaine-induced mood disorder ([place]) [F14.94] 08/14/2015  . Gout [M10.9] 07/10/2015  . CKD (chronic kidney disease) stage 3, GFR 30-59 ml/min ([name]) [N18.3] 03/06/2015  . Normocytic anemia [D64.9] 03/06/2015  . Hypoglycemia [E16.2]   . Encounter for general adult medical examination with abnormal findings [Z00.01] 02/09/2015  . Substance or medication-induced depressive disorder with onset during withdrawal [facility]) [Y50.354, F19.24, F32.89] 12/13/2014  . Cervicalgia [M54.2] 06/28/2014  . Lumbar radiculopathy, chronic [M54.16] 06/28/2014  . Asthma, chronic [J45.909] 02/03/2014  . 3-vessel CAD [I25.10] 06/24/2013  . ED (erectile dysfunction) of organic origin [N52.9] 07/07/2012  . Hypertension goal BP (blood pressure) < 140/80 [I10] 04/29/2012  . Chondromalacia of left knee [M94.262] 03/19/2012  . Hyperlipidemia with target LDL less than 100 [E78.5] 02/12/2012  . Fibromyalgia [M79.7] 02/12/2012  . HIV (human immunodeficiency virus infection) ([place]) [B20] 08/27/2011  . Uncontrolled type 2 diabetes with  neuropathy ([place]) [E11.40, E11.65] 10/17/2000   History of Present Illness: This is an admission assessment for this 67 year old AA male with hx of Cocaine use disorder, depression & medical hx of HIV infection, diabetes mellitus & heart condition. Admitted to the [facility] from the [facility] with the complaints of a 6 days cocaine binge, triggering worsening depression & suicidal ideations with plans to overdose on crack. The chart review also indicated complaints of auditory hallucinations telling him bad things. Patient apparently has been homeless & has been residing  For 3 months at the [facility] in [place], [place] area & has been in the [place] area x 1 week. He has multiple chronic health issues that include; diabetes mellitus, heart condition & HIV infection.  During this assessment, Michael [name] reports, "The ambulance took me to the [facility] 3 days ago. I called 911 because I was feeling very depressed & unsafe. I was at the [facility] in [place] area for 3 months because I'm homeless. I left the [facility] after being there 90 days & came to the [place], area. My brother brought me to [facility] area a week ago. Prior to leaving the [facility], I was depressed. I have to have heart surgery coming up on the 5th of March, 2019. That is why I'm feeling very depressed. I don't know why this is happening to me because, I had a stent put in my heart in 2005. I was told recently that part of my heart is hardened. Then, I started thinking about hurting myself because I have been doing drugs for 6 day straight. I don't like the life style I have been living. I have been using Cocaine since age 31. I have just spent my life savings on cocaine in just 6 days. I  have never had mental health treatment. But, I have been to [facility] for substance abuse treatment over 2 years ago. I'm currently homeless. I need help getting off drugs. I will need housing too because I'm HIV positive. I  can't live on the streets".  Associated Signs/Symptoms:  Depression Symptoms:  depressed mood, insomnia, hopelessness, anxiety,  (Hypo) Manic Symptoms:  Hallucinations, Impulsivity,  Anxiety Symptoms:  Excessive Worry,  Psychotic Symptoms:  Hallucinations: Auditory Visual  PTSD Symptoms: Denies any PTSD symptoms or events.  Total Time spent with patient: 1 hour  Past Psychiatric History: Major depression.  Is the patient at risk to self? Yes.    Has the patient been a risk to self in the past 6 months? Yes.    Has the patient been a risk to self within the distant past? Yes.    Is the patient a risk to others? No.  Has the patient been a risk to others in the past 6 months? No.  Has the patient been a risk to others within the distant past? No.   Prior Inpatient Therapy: Yes, [facility] a long time ago. Prior Outpatient Therapy: Denies.  Alcohol Screening: 1. How often do you have a drink containing alcohol?: Never 2. How many drinks containing alcohol do you have on a typical day when you are drinking?: 1 or 2 3. How often do you have six or more drinks on one occasion?: Never AUDIT-C Score: 0 9. Have you or someone else been injured as a result of your drinking?: No 10. Has a relative or friend or a doctor or another health worker been concerned about your drinking or suggested you cut down?: No Alcohol Use Disorder Identification Test Final Score (AUDIT): 0 Intervention/Follow-up: AUDIT Score <7 follow-up not indicated  Substance Abuse History in the last 12 months:  Yes.    Consequences of Substance Abuse: Medical Consequences:  Liver damage, Possible death by overdose Legal Consequences:  Arrests, jail time, Loss of driving privilege. Family Consequences:  Family discord, divorce and or separation.  Previous Psychotropic Medications: Yes, (Zoloft)  Psychological Evaluations: No   Past Medical History:  Past Medical History:  Diagnosis Date  . A-fib ([place])   .  Asthma    No PFTs, history of childhood asthma  . CAD (coronary artery disease)    a. 06/2013 STEMI/PCI ([facility]): LAD w/ thrombus (treated with BMS), mid 75%, D2 75%; LCX OM2 75%; RCA small, PDA 95%, PLV 95%;  b. 10/2013 Cath/PCI: ISR w/in LAD (Promus DES x 2), borderline OM2 lesion;  c. 01/2014 MV: Intermediate risk, medium-sized distal ant wall infarct w/ very small amt of peri-infarct ischemia. EF 60%.  . Cellulitis 04/2014   left facial  . Chondromalacia of medial femoral condyle    Left knee MRI 04/28/12: Chondromalacia of the medial femoral condyle with slight peripheral degeneration of the meniscocapsular junction of the medial meniscus; followed by sports medicine  . Collagen vascular disease ([place])   . Crack cocaine use    for 20+ years, has been enrolled in detox programs in the past  . Depression    with history of hospitalization for suicidal ideation  . Diabetes mellitus 2002   Diagnosed in 2002, started insulin in 2012  . Gout   . Gout 04/28/2012  . Headache(784.0)    CT head 08/2011: Periventricular and subcortical white matter hypodensities are most in keeping with chronic microangiopathic change  . HIV infection [facility]) Nov 2012   Followed by [name]  . Hyperlipidemia   .  Hypertension   . Pulmonary embolism [facility])     Past Surgical History:  Procedure Laterality Date  . BACK SURGERY     1988  . BOWEL RESECTION    . CARDIAC SURGERY    . CERVICAL SPINE SURGERY     " rods in my neck "  . CORONARY STENT PLACEMENT    . NM MYOCAR PERF WALL MOTION  12/27/2011   normal  . SPINE SURGERY     Family History:  Family History  Problem Relation Age of Onset  . Diabetes Mother   . Hypertension Mother   . Hyperlipidemia Mother   . Diabetes Father   . Cancer Father   . Hypertension Father   . Diabetes Brother   . Heart disease Brother   . Diabetes Sister   . Colon cancer Neg Hx    Family Psychiatric  History: Patient denies any familial hx of mental illness or substance use  disorder.  Tobacco Screening: Have you used any form of tobacco in the last 30 days? (Cigarettes, Smokeless Tobacco, Cigars, and/or Pipes): No  Social History: Divorced, 2 children, Unemployed, disabled, Receiving SSI, Currently homeless.  Social History   Substance and Sexual Activity  Alcohol Use No  . Alcohol/week: 2.4 oz  . Types: 2 Cans of beer, 2 Shots of liquor per week     Social History   Substance and Sexual Activity  Drug Use Yes  . Types: "Crack" cocaine, Cocaine   Comment: last use January 2018    Additional Social History: Does not smoke, however, use cocaine.  Allergies:  No Known Allergies  Lab Results:  Results for orders placed or performed during the hospital encounter of 11/18/17 (from the past 48 hour(s))  Glucose, capillary     Status: Abnormal   Collection Time: 11/18/17  5:19 PM  Result Value Ref Range   Glucose-Capillary 341 (H) 65 - 99 mg/dL  Glucose, capillary     Status: Abnormal   Collection Time: 11/18/17  7:37 PM  Result Value Ref Range   Glucose-Capillary 349 (H) 65 - 99 mg/dL  Glucose, capillary     Status: Abnormal   Collection Time: 11/19/17  5:12 AM  Result Value Ref Range   Glucose-Capillary 300 (H) 65 - 99 mg/dL   Blood Alcohol level:  Lab Results  Component Value Date   ETH <10 11/18/2017   ETH <5 03/70/4888   Metabolic Disorder Labs:  Lab Results  Component Value Date   HGBA1C 10.6 12/26/2016   MPG 189 12/15/2014   MPG 280 (H) 05/01/2014   No results found for: PROLACTIN Lab Results  Component Value Date   CHOL 188 10/14/2017   TRIG 113 10/14/2017   HDL 64 10/14/2017   CHOLHDL 2.9 10/14/2017   VLDL 24 01/08/2016   LDLCALC 75 12/26/2016   LDLCALC 128 01/08/2016   Current Medications: Current Facility-Administered Medications  Medication Dose Route Frequency Provider Last Rate Last Dose  . acetaminophen (TYLENOL) tablet 650 mg  650 mg Oral Q6H PRN Ethelene Hal, NP   650 mg at 11/18/17 2108  . albuterol  (PROVENTIL HFA;VENTOLIN HFA) 108 (90 Base) MCG/ACT inhaler 1 puff  1 puff Inhalation Q6H PRN Nwoko, Agnes I, NP      . alum & mag hydroxide-simeth (MAALOX/MYLANTA) 200-200-20 MG/5ML suspension 30 mL  30 mL Oral Q4H PRN Ethelene Hal, NP      . diltiazem (CARDIZEM CD) 24 hr capsule 120 mg  120 mg Oral Daily Lindell Spar  I, NP   120 mg at 11/19/17 0917  . emtricitabine-rilpivir-tenofovir AF (ODEFSEY) 200-25-25 MG per tablet 1 tablet  1 tablet Oral Q breakfast Ethelene Hal, NP   1 tablet at 11/19/17 9357  . famotidine (PEPCID) tablet 20 mg  20 mg Oral Daily Ethelene Hal, NP   20 mg at 11/19/17 0916  . gabapentin (NEURONTIN) capsule 400 mg  400 mg Oral BID Ethelene Hal, NP   400 mg at 11/19/17 0177  . hydrOXYzine (ATARAX/VISTARIL) tablet 25 mg  25 mg Oral TID PRN Ethelene Hal, NP      . insulin aspart (novoLOG) injection 0-15 Units  0-15 Units Subcutaneous TID WC Lindell Spar I, NP   8 Units at 11/19/17 [phone]  . insulin aspart (novoLOG) injection 5 Units  5 Units Subcutaneous TID WC Lindell Spar I, NP   5 Units at 11/19/17 [phone]  . insulin glargine (LANTUS) injection 30 Units  30 Units Subcutaneous QHS Lindell Spar I, NP   30 Units at 11/18/17 2109  . losartan (COZAAR) tablet 50 mg  50 mg Oral Daily Ethelene Hal, NP   50 mg at 11/19/17 2330  . magnesium hydroxide (MILK OF MAGNESIA) suspension 30 mL  30 mL Oral Daily PRN Ethelene Hal, NP      . metFORMIN (GLUCOPHAGE) tablet 1,000 mg  1,000 mg Oral BID WC Lindell Spar I, NP   1,000 mg at 11/19/17 0916  . ondansetron (ZOFRAN) tablet 4 mg  4 mg Oral Q8H PRN Ethelene Hal, NP      . traZODone (DESYREL) tablet 50 mg  50 mg Oral QHS PRN Ethelene Hal, NP   50 mg at 11/18/17 2108   PTA Medications: Medications Prior to Admission  Medication Sig Dispense Refill Last Dose  . ACCU-CHEK SOFTCLIX LANCETS lancets USE TO CHECK BLOOD GLUCOSE LEVELS UP TO 4 TIMES DAILY AS DIRECTED 100 each 5  Taking  . allopurinol (ZYLOPRIM) 300 MG tablet Take 300 mg by mouth daily.   Past Week at Unknown time  . amantadine (SYMMETREL) 100 MG capsule Take 300 mg by mouth daily.   Past Week at Unknown time  . blood glucose meter kit and supplies KIT Dispense based on patient and insurance preference. Use up to four times daily as directed. (FOR ICD-9 250.00, 250.01). 1 each 0 Taking  . diltiazem (CARDIZEM CD) 120 MG 24 hr capsule Take 1 capsule (120 mg total) by mouth daily. (Patient not taking: Reported on 11/18/2017) 30 capsule 0 Not Taking at Unknown time  . emtricitabine-rilpivir-tenofovir AF (ODEFSEY) 200-25-25 MG TABS tablet Take 1 tablet by mouth daily with breakfast. 90 tablet 5 Past Week at Unknown time  . gabapentin (NEURONTIN) 400 MG capsule TAKE 1 CAPSULE (400 MG TOTAL) BY MOUTH THREE TIMES DAILY. 90 capsule 2 Taking  . glucose blood (ACCU-CHEK AVIVA PLUS) test strip USE TO CHECK BLOOD GLUCOSE LEVELS UP TO 4 TIMES DAILY AS DIRECTED 100 each 5 Taking  . hydrochlorothiazide (HYDRODIURIL) 12.5 MG tablet Take 1 tablet (12.5 mg total) by mouth daily. (Patient not taking: Reported on 10/22/2017) 90 tablet 3 Not Taking at Unknown time  . insulin lispro (HUMALOG) 100 UNIT/ML injection Inject 0.05 mLs (5 Units total) into the skin 3 (three) times daily before meals. Sliding scale as directed. (Patient not taking: Reported on 11/18/2017) 10 mL 5 Not Taking at Unknown time  . Insulin Syringe-Needle U-100 (B-D INS SYR MICROFINE 1CC/27G) 27G X 5/8" 1 ML MISC 1 strip  by Does not apply route 3 (three) times daily as needed. 30 each 6 Taking  . isosorbide mononitrate (IMDUR) 30 MG 24 hr tablet Take 1 tablet (30 mg total) by mouth daily. NEED OV. (Patient not taking: Reported on 11/18/2017) 30 tablet 0 Not Taking at Unknown time  . LANTUS SOLOSTAR 100 UNIT/ML Solostar Pen INJECT 0.25MLS (25 UNITS TOTAL) SUBCUTANEOUSLY AT BEDTIME (Patient taking differently: INJECT 30 units SUBCUTANEOUSLY AT BEDTIME) 15 mL 1 Past Week  at Unknown time  . losartan (COZAAR) 50 MG tablet Take 1 tablet (50 mg total) by mouth daily. 14 tablet 0 Past Week at Unknown time  . metFORMIN (GLUCOPHAGE) 500 MG tablet TAKE TWO TABLETS (1,000 MG TOTAL) BY MOUTH TWO TIMES DAILY BEFORE A MEAL. 180 tablet 1 Past Week at Unknown time  . pravastatin (PRAVACHOL) 40 MG tablet Take 1 tablet (40 mg total) by mouth at bedtime. 90 tablet 1 Past Week at Unknown time  . PROAIR HFA 108 (90 Base) MCG/ACT inhaler INHALE TWO PUFFS INTO THE LUNGS EVERY 6 (SIX) HOURS AS NEEDED FOR WHEEZING OR SHORTNESS OF BREATH. 8.5 Inhaler 0 unk  . Rivaroxaban 15 & 20 MG TBPK Take as directed on package: Start with one 53m tablet by mouth twice a day with food. On Day 22, switch to one 259mtablet once a day with food. (Patient not taking: Reported on 03/07/2017) 51 each 0 Not Taking at Unknown time  . sertraline (ZOLOFT) 50 MG tablet Take 1 tablet (50 mg total) by mouth daily. (Patient not taking: Reported on 11/18/2017) 30 tablet 0 Not Taking at Unknown time   Musculoskeletal: Strength & Muscle Tone: within normal limits Gait & Station: normal Patient leans: N/A  Psychiatric Specialty Exam: Physical Exam  Constitutional: He appears well-developed.  HENT:  Head: Normocephalic.  Eyes: Pupils are equal, round, and reactive to light.  Neck: Normal range of motion.  Cardiovascular:  Hx, heart condition/HTN  Respiratory: Effort normal.  GI: Soft.  Genitourinary:  Genitourinary Comments: Deferred  Musculoskeletal: Normal range of motion.  Neurological: He is alert.  Skin: Skin is warm.    Review of Systems  Constitutional: Negative for malaise/fatigue.  HENT: Negative.   Eyes: Negative.   Respiratory: Negative.   Cardiovascular:       Hx. Cardiac issues, HTN  Gastrointestinal: Negative.   Genitourinary: Negative.   Musculoskeletal: Positive for joint pain and myalgias.  Skin: Negative.   Neurological: Negative.   Endo/Heme/Allergies: Negative.    Psychiatric/Behavioral: Positive for depression, hallucinations (Auditory/visual hallucinations), substance abuse (UDS (+) for Cocaine) and suicidal ideas. Negative for memory loss. The patient is nervous/anxious and has insomnia.     Blood pressure (!) 156/72, pulse 73, temperature 97.6 F (36.4 C), temperature source Oral, resp. rate 18, height 5' 5.35" (1.66 m), weight 81.1 kg (178 lb 12.8 oz).Body mass index is 29.43 kg/m.  General Appearance: Guarded and in a burgundy hospital scrub.  Eye Contact:  Good  Speech:  Clear and Coherent and Slow  Volume:  Decreased  Mood:  Anxious, Depressed and Hopeless  Affect:  Depressed and Flat  Thought Process:  Coherent, Goal Directed and Descriptions of Associations: Intact  Orientation:  Full (Time, Place, and Person)  Thought Content:  Hallucinations: Auditory Visual  Suicidal Thoughts:  Currently admits passive SI, denies any plans or intent. Able to contract for safety, verbally  Homicidal Thoughts:  Denies  Memory:  Immediate;   Good Recent;   Good Remote;   Good  Judgement:  Fair  Insight:  Fair  Psychomotor Activity:  Decreased  Concentration:  Concentration: Good and Attention Span: Good  Recall:  Good  Fund of Knowledge:  Fair  Language:  Good  Akathisia:  No  Handed:  Right  AIMS (if indicated):     Assets:  Communication Skills Desire for Improvement  ADL's:  Intact  Cognition:  WNL  Sleep:  Number of Hours: 6.75   Treatment Plan/Recommendations: 1. Admit for crisis management and stabilization, estimated length of stay 3-5 days.   2. Medication management to reduce current symptoms to base line and improve the patient's overall level of functioning: See MAR, Md's SRA & treatment plan.   3. Treat health problems as indicated.  4. Develop treatment plan to decrease risk of relapse upon discharge and the need for readmission.  5. Psycho-social education regarding relapse prevention and self care.  6. Health care follow up  as needed for medical problems.  7. Review, reconcile, and reinstate any pertinent home medications for other health issues where appropriate. 8. Call for consults with hospitalist for any additional specialty patient care services as needed.  Observation Level/Precautions:  15 minute checks  Laboratory:  Per ED, UDS (+) for Cocaine  Psychotherapy: Group sessions  Medications: See MAR   Consultations: As needed   Discharge Concerns: safety, Sobriety, mood stability.   Estimated LOS: 2-4 days  Other: Admit to the [facility].     Physician Treatment Plan for Primary Diagnosis: Severe recurrent major depressive disorder with psychotic features ([place])  Long Term Goal(s): Improvement in symptoms so as ready for discharge  Short Term Goals: Ability to identify changes in lifestyle to reduce recurrence of condition will improve, Ability to disclose and discuss suicidal ideas and Ability to demonstrate self-control will improve  Physician Treatment Plan for Secondary Diagnosis: Principal Problem:   Severe recurrent major depressive disorder with psychotic features ([place]) Active Problems:   Cocaine use disorder, severe, dependence ([place])   Cocaine abuse with cocaine-induced mood disorder ([place])   MDD (major depressive disorder), recurrent severe, without psychosis ([place])  Long Term Goal(s): Improvement in symptoms so as ready for discharge  Short Term Goals: Ability to identify and develop effective coping behaviors will improve, Compliance with prescribed medications will improve and Ability to identify triggers associated with substance abuse/mental health issues will improve  I certify that inpatient services furnished can reasonably be expected to improve the patient's condition.    Lindell Spar, NP, PMHNP, FNP-BC 2/13/201910:50 AM    I have reviewed NP's Note, assessement, diagnosis and plan, and agree. I have also met with patient and completed suicide risk assessment.  Michael [name] is  a 67 y/o M with history of MDD, cocaine use disorder, and multiple medical problems including recent MI, HIV, DMII, and A Fib who presented voluntarily with worsening symptoms of depression, SI to overdose on crack cocaine, AH, and worsening substance use of cocaine. Pt also reported being off of his medications for over a month.  Upon initial evaluation, pt shares, "I smoked some crack cocaine and then things just got really bad." Pt shares that after having maintained sobriety for 3 months, he recently had [facility] [place] and then "started hanging with the wrong people." Pt shares that he had relapse of use of cocaine and he used continuously while he was awake for a period of six days. This resulted in having AH, developing anxiety and depression, and then having SI with plan to overdose on cocaine. Pt then called  emergency services for help. He reports AH were only in the context of use of cocaine. He denies current SI/HI/AH/VH. He has poor sleep outside the hospital, depressed mood, anxiety, guilty feelings, low energy, poor concentration, and fair appetite. He denies symptoms of mania, OCD, and PTSD. He denies illicit substance use aside from cocaine.  Discussed with patient about treatment options. He reports that previously he had done well on zoloft, and he would like to resume that medication. Pt also shares that he would like to stay at the [facility] after discharge. He has plans to go through with his scheduled heart surgery in [place] on March 5, and pt was advised to speak with his surgeon regarding recent use of cocaine as this could affect his heart health and suitability for surgery, and pt verbalized good understanding. Pt agreed to be restarted on previous outpatient medications for his medical concerns. He had no further questions, comments, concerns.   PLAN OF CARE:   - Admit to inpatient level of care  -MDD              -Start zoloft 25m po  qDay  - Anxiety              -Continue atarax 278mpo TID prn anxiety             - Continue gabapentin 40070mo BID  -Insomnia              - Continue trazodone 52m42m qhs prn insomnia  -HLD             - Continue pravastatin  -DMII             - Continue metformin, SSI, HCTZ  -HTN             - Continue losartan, Imdur  - Continue all other home medications without changes  - Encourage participation in groups and the therapeutic milieu  -Discharge planning will be ongoing    ChriMaris Berger

## 2017-11-19 NOTE — Tx Team (Signed)
Interdisciplinary Treatment and Diagnostic Plan Update  11/19/2017 Time of Session: 0830AM Michael [name] MRN: [id number]  Principal Diagnosis: MDD, recurrent, severe, without psychosis  Secondary Diagnoses: Active Problems:   MDD (major depressive disorder), recurrent severe, without psychosis ([place])   Current Medications:  Current Facility-Administered Medications  Medication Dose Route Frequency Provider Last Rate Last Dose  . acetaminophen (TYLENOL) tablet 650 mg  650 mg Oral Q6H PRN Ethelene Hal, NP   650 mg at 11/18/17 2108  . albuterol (PROVENTIL HFA;VENTOLIN HFA) 108 (90 Base) MCG/ACT inhaler 1 puff  1 puff Inhalation Q6H PRN Nwoko, Agnes I, NP      . alum & mag hydroxide-simeth (MAALOX/MYLANTA) 200-200-20 MG/5ML suspension 30 mL  30 mL Oral Q4H PRN Ethelene Hal, NP      . diltiazem (CARDIZEM CD) 24 hr capsule 120 mg  120 mg Oral Daily Lindell Spar I, NP   120 mg at 11/18/17 1823  . emtricitabine-rilpivir-tenofovir AF (ODEFSEY) 200-25-25 MG per tablet 1 tablet  1 tablet Oral Q breakfast Ethelene Hal, NP      . famotidine (PEPCID) tablet 20 mg  20 mg Oral Daily Ethelene Hal, NP      . gabapentin (NEURONTIN) capsule 400 mg  400 mg Oral BID Ethelene Hal, NP   400 mg at 11/18/17 1823  . hydrOXYzine (ATARAX/VISTARIL) tablet 25 mg  25 mg Oral TID PRN Ethelene Hal, NP      . insulin aspart (novoLOG) injection 0-15 Units  0-15 Units Subcutaneous TID WC Lindell Spar I, NP   8 Units at 11/19/17 [phone]  . insulin aspart (novoLOG) injection 5 Units  5 Units Subcutaneous TID WC Lindell Spar I, NP   5 Units at 11/19/17 [phone]  . insulin glargine (LANTUS) injection 30 Units  30 Units Subcutaneous QHS Lindell Spar I, NP   30 Units at 11/18/17 2109  . losartan (COZAAR) tablet 50 mg  50 mg Oral Daily Ethelene Hal, NP      . magnesium hydroxide (MILK OF MAGNESIA) suspension 30 mL  30 mL Oral Daily PRN Ethelene Hal, NP      .  metFORMIN (GLUCOPHAGE) tablet 1,000 mg  1,000 mg Oral BID WC Nwoko, Agnes I, NP      . nicotine (NICODERM CQ - dosed in mg/24 hours) patch 21 mg  21 mg Transdermal Daily Ethelene Hal, NP      . ondansetron [facility]) tablet 4 mg  4 mg Oral Q8H PRN Ethelene Hal, NP      . traZODone (DESYREL) tablet 50 mg  50 mg Oral QHS PRN Ethelene Hal, NP   50 mg at 11/18/17 2108   PTA Medications: Medications Prior to Admission  Medication Sig Dispense Refill Last Dose  . ACCU-CHEK SOFTCLIX LANCETS lancets USE TO CHECK BLOOD GLUCOSE LEVELS UP TO 4 TIMES DAILY AS DIRECTED 100 each 5 Taking  . allopurinol (ZYLOPRIM) 300 MG tablet Take 300 mg by mouth daily.   Past Week at Unknown time  . amantadine (SYMMETREL) 100 MG capsule Take 300 mg by mouth daily.   Past Week at Unknown time  . blood glucose meter kit and supplies KIT Dispense based on patient and insurance preference. Use up to four times daily as directed. (FOR ICD-9 250.00, 250.01). 1 each 0 Taking  . diltiazem (CARDIZEM CD) 120 MG 24 hr capsule Take 1 capsule (120 mg total) by mouth daily. (Patient not taking: Reported on 11/18/2017) 30 capsule 0  Not Taking at Unknown time  . emtricitabine-rilpivir-tenofovir AF (ODEFSEY) 200-25-25 MG TABS tablet Take 1 tablet by mouth daily with breakfast. 90 tablet 5 Past Week at Unknown time  . gabapentin (NEURONTIN) 400 MG capsule TAKE 1 CAPSULE (400 MG TOTAL) BY MOUTH THREE TIMES DAILY. 90 capsule 2 Taking  . glucose blood (ACCU-CHEK AVIVA PLUS) test strip USE TO CHECK BLOOD GLUCOSE LEVELS UP TO 4 TIMES DAILY AS DIRECTED 100 each 5 Taking  . hydrochlorothiazide (HYDRODIURIL) 12.5 MG tablet Take 1 tablet (12.5 mg total) by mouth daily. (Patient not taking: Reported on 10/22/2017) 90 tablet 3 Not Taking at Unknown time  . insulin lispro (HUMALOG) 100 UNIT/ML injection Inject 0.05 mLs (5 Units total) into the skin 3 (three) times daily before meals. Sliding scale as directed. (Patient not taking:  Reported on 11/18/2017) 10 mL 5 Not Taking at Unknown time  . Insulin Syringe-Needle U-100 (B-D INS SYR MICROFINE 1CC/27G) 27G X 5/8" 1 ML MISC 1 strip by Does not apply route 3 (three) times daily as needed. 30 each 6 Taking  . isosorbide mononitrate (IMDUR) 30 MG 24 hr tablet Take 1 tablet (30 mg total) by mouth daily. NEED OV. (Patient not taking: Reported on 11/18/2017) 30 tablet 0 Not Taking at Unknown time  . LANTUS SOLOSTAR 100 UNIT/ML Solostar Pen INJECT 0.25MLS (25 UNITS TOTAL) SUBCUTANEOUSLY AT BEDTIME (Patient taking differently: INJECT 30 units SUBCUTANEOUSLY AT BEDTIME) 15 mL 1 Past Week at Unknown time  . losartan (COZAAR) 50 MG tablet Take 1 tablet (50 mg total) by mouth daily. 14 tablet 0 Past Week at Unknown time  . metFORMIN (GLUCOPHAGE) 500 MG tablet TAKE TWO TABLETS (1,000 MG TOTAL) BY MOUTH TWO TIMES DAILY BEFORE A MEAL. 180 tablet 1 Past Week at Unknown time  . pravastatin (PRAVACHOL) 40 MG tablet Take 1 tablet (40 mg total) by mouth at bedtime. 90 tablet 1 Past Week at Unknown time  . PROAIR HFA 108 (90 Base) MCG/ACT inhaler INHALE TWO PUFFS INTO THE LUNGS EVERY 6 (SIX) HOURS AS NEEDED FOR WHEEZING OR SHORTNESS OF BREATH. 8.5 Inhaler 0 unk  . Rivaroxaban 15 & 20 MG TBPK Take as directed on package: Start with one 67m tablet by mouth twice a day with food. On Day 22, switch to one 235mtablet once a day with food. (Patient not taking: Reported on 03/07/2017) 51 each 0 Not Taking at Unknown time  . sertraline (ZOLOFT) 50 MG tablet Take 1 tablet (50 mg total) by mouth daily. (Patient not taking: Reported on 11/18/2017) 30 tablet 0 Not Taking at Unknown time    Patient Stressors: Financial difficulties Health problems Substance abuse Other: Homeless  Patient Strengths: CoCuratorund of knowledge Motivation for treatment/growth  Treatment Modalities: Medication Management, Group therapy, Case management,  1 to 1 session with clinician, Psychoeducation,  Recreational therapy.   Physician Treatment Plan for Primary Diagnosis: MDD, recurrent, severe, without psychosis  Medication Management: Evaluate patient's response, side effects, and tolerance of medication regimen.  Therapeutic Interventions: 1 to 1 sessions, Unit Group sessions and Medication administration.  Evaluation of Outcomes: Progressing  Physician Treatment Plan for Secondary Diagnosis: Active Problems:   MDD (major depressive disorder), recurrent severe, without psychosis ([place] Medication Management: Evaluate patient's response, side effects, and tolerance of medication regimen.  Therapeutic Interventions: 1 to 1 sessions, Unit Group sessions and Medication administration.  Evaluation of Outcomes: Progressing   RN Treatment Plan for Primary Diagnosis: MDD, recurrent, severe, without psychosis Long Term Goal(s): Knowledge of  disease and therapeutic regimen to maintain health will improve  Short Term Goals: Ability to remain free from injury will improve, Ability to disclose and discuss suicidal ideas and Ability to identify and develop effective coping behaviors will improve  Medication Management: RN will administer medications as ordered by provider, will assess and evaluate patient's response and provide education to patient for prescribed medication. RN will report any adverse and/or side effects to prescribing provider.  Therapeutic Interventions: 1 on 1 counseling sessions, Psychoeducation, Medication administration, Evaluate responses to treatment, Monitor vital signs and CBGs as ordered, Perform/monitor CIWA, COWS, AIMS and Fall Risk screenings as ordered, Perform wound care treatments as ordered.  Evaluation of Outcomes: Progressing   LCSW Treatment Plan for Primary Diagnosis: MDD, recurrent, severe, without psychosis Long Term Goal(s): Safe transition to appropriate next level of care at discharge, Engage patient in therapeutic group addressing interpersonal  concerns.  Short Term Goals: Engage patient in aftercare planning with referrals and resources, Facilitate patient progression through stages of change regarding substance use diagnoses and concerns and Identify triggers associated with mental health/substance abuse issues  Therapeutic Interventions: Assess for all discharge needs, 1 to 1 time with Social worker, Explore available resources and support systems, Assess for adequacy in community support network, Educate family and significant other(s) on suicide prevention, Complete Psychosocial Assessment, Interpersonal group therapy.  Evaluation of Outcomes: Progressing   Progress in Treatment: Attending groups: Yes. Participating in groups: Yes. Taking medication as prescribed: Yes. Toleration medication: Yes. Family/Significant other contact made: No, will contact:  family member if patient consents Patient understands diagnosis: Yes. Discussing patient identified problems/goals with staff: Yes. Medical problems stabilized or resolved: Yes. Denies suicidal/homicidal ideation: Yes. Issues/concerns per patient self-inventory: No. Other: n/a  New problem(s) identified: No, Describe:  n/a  New Short Term/Long Term Goal(s): detox, medication management for mood stabilization; elimination of SI thoughts; development of comprehensive mental wellness/sobriety plan.   Patient Goal: "to get into a residential program from here."   Discharge Plan or Barriers: CSW assessing for appropriate referrals. Pt is requesting residential treatment referral. He has no current outpatient providers.   Reason for Continuation of Hospitalization: Anxiety Depression Medication stabilization Suicidal ideation Withdrawal symptoms  Estimated Length of Stay: Monday, 11/24/17  Attendees: Patient: Michael [name]  11/19/2017 8:12 AM  Physician: Dr. Sanjuana Letters MD; Dr. Nancy Fetter MD 11/19/2017 8:12 AM  Nursing: Jinny Sanders RN; [place] RN 11/19/2017 8:12 AM  RN Care  Manager: x 11/19/2017 8:12 AM  Social Worker: [place], LCSW 11/19/2017 8:12 AM  Recreational Therapist: x 11/19/2017 8:12 AM  Other: Lindell Spar NP; Darnelle Maffucci Money NP 11/19/2017 8:12 AM  Other:  11/19/2017 8:12 AM  Other: 11/19/2017 8:12 AM    Scribe for Treatment Team: Kimber Relic Smart, LCSW 11/19/2017 8:12 AM

## 2017-11-19 NOTE — Progress Notes (Signed)
D: Patient observed resting in bed much of the day. Isolative and withdrawn. Does come up to NS when prompted. Patient's affect flat, mood depressed. Per self inventory and discussions with writer, rates depression, hopelessness and anxiety all at an 8/10. Rates sleep as fair, appetite as fair, energy as low and concentration as poor.  States goal for today is "getting help, talk to someone." Denies pain, physical complaints.   A: Medicated per orders, no prns requested or required. Level III obs in place for safety. Emotional support offered and self inventory reviewed. Encouraged completion of Suicide Safety Plan and programming participation. Discussed POC with MD, SW.  Fall prevention plan in place and reviewed with patient as pt is a moderate fall risk due to age, unsteady gait.   R: Patient verbalizes understanding of POC, falls prevention education. Patient endorses passive SI however denies plan, intent. Verbal contract for safety. Patient denies HI/AVH and remains safe on level III obs. Will continue to monitor closely and make verbal contact frequently.

## 2017-11-20 ENCOUNTER — Other Ambulatory Visit: Payer: Self-pay | Admitting: Infectious Diseases

## 2017-11-20 DIAGNOSIS — J45909 Unspecified asthma, uncomplicated: Secondary | ICD-10-CM

## 2017-11-20 LAB — GLUCOSE, CAPILLARY
GLUCOSE-CAPILLARY: 208 mg/dL — AB (ref 65–99)
GLUCOSE-CAPILLARY: 212 mg/dL — AB (ref 65–99)
Glucose-Capillary: 164 mg/dL — ABNORMAL HIGH (ref 65–99)
Glucose-Capillary: 105 mg/dL — ABNORMAL HIGH (ref 65–99)

## 2017-11-20 NOTE — Progress Notes (Signed)
D:Pt reports passive si thoughts this morning and says that his depression became worse after having a heart attack. He c/o back pain and was given prn medication. Pt says that he has a son and a daughter that live close. He has a flat/blunted affect. A:Offered support, encouragement and 15 minute checks.  R:Pt contracts with staff for safety. Safety maintained on the unit.

## 2017-11-20 NOTE — Progress Notes (Signed)
Pt did attend the evening wrap up group. Pt was engaged and supportive. Pt shared one good thing about the day is that he is alive. Pt is thankful that he has found extended treatment where he plans to discharge to  the [facility].  Pt participated in sharing three loves for Valentine's day. Pt loves to eat frog legs, loves to fish, and loves to hear pretty voices.

## 2017-11-20 NOTE — Progress Notes (Signed)
Pt is declining [facility] referral and is committed to [facility]. Pt spoke with admissions on the phone who told him to come on Monday with photo ID. Pt agreeable to appt at [facility] for outpatient mental health care.  Maxie Better, MSW, LCSW Clinical Social Worker 11/20/2017 9:19 AM

## 2017-11-20 NOTE — [facility] Group Notes (Signed)
LCSW Group Therapy Note  11/20/2017 1:15pm  Type of Therapy/Topic:  Group Therapy:  Feelings about Diagnosis  Participation Level:  Active   Description of Group:   This group will allow patients to explore their thoughts and feelings about diagnoses they have received. Patients will be guided to explore their level of understanding and acceptance of these diagnoses. Facilitator will encourage patients to process their thoughts and feelings about the reactions of others to their diagnosis and will guide patients in identifying ways to discuss their diagnosis with significant others in their lives. This group will be process-oriented, with patients participating in exploration of their own experiences, giving and receiving support, and processing challenge from other group members.   Therapeutic Goals: 1. Patient will demonstrate understanding of diagnosis as evidenced by identifying two or more symptoms of the disorder 2. Patient will be able to express two feelings regarding the diagnosis 3. Patient will demonstrate their ability to communicate their needs through discussion and/or role play  Summary of Patient Progress:  Michael [name] was attentive and engaged during today's processing group. He shared that he understands his mental illness better now that he has a formal diagnosis. He is looking forward to going to [facility] to "get the treatment I need." Michael [name] continues to show progress in the group setting with improving insight.   Therapeutic Modalities:   Cognitive Behavioral Therapy Brief Therapy Feelings Identification    Michael Relic Smart, LCSW 11/20/2017 12:47 PM

## 2017-11-20 NOTE — Progress Notes (Signed)
[facility] MD Progress Note  11/20/2017 1:53 PM TAL KEMPKER  MRN:  [id number] Subjective:    Michael [name] is a 67 y/o M with history of MDD, cocaine use disorder, and multiple medical problems including recent MI, HIV, DMII, and A Fib who presented voluntarily with worsening symptoms of depression, SI to overdose on crack cocaine, AH, and worsening substance use of cocaine. Pt also reported being off of his medications for over a month. He agreed to be restarted on sertraline, and he was admitted to [facility] for additional treatment and stabilization.  Upon evaluation today, pt shares, "I'm feeling better than yesterday. Thinking is a little clearer, and my depression is a little better." Pt is unsure if he is having SI today, stating, "I think they are gone - I'm not sure." He denies HI/AH/VH. He slept well overnight. His appetite is good. He denies any physical complaints. He is tolerating his medications without difficulty or side effects. Pt agrees to continue his current regimen without changes. Discussed with patient about disposition, and pt confirms that his plan is still to go to the [facility] on Monday 2/18, and he thinks that he will likely be safe to discharge at that time. Pt was asked if he could discharge earlier, such as today, and he replied, "I don't think I could right now because it would be too overwhelming and I would just go back to using and I'd be right back where I started when I came in." Pt is in agreement to continue his current treatment regimen. We will continue his admission as he is unable to engage in meaningful safety planning today and he is still having SI which are improving.   Principal Problem: Severe recurrent major depressive disorder with psychotic features ([place]) Diagnosis:   Patient Active Problem List   Diagnosis Date Noted  . Hepatitis C [B19.20] 12/30/2016  . Back pain [M54.9] 04/18/2016  . S/P carotid endarterectomy [Z98.890] 11/15/2015  . MDD  (major depressive disorder), recurrent severe, without psychosis ([place]) [F33.2] 09/09/2015  . Cocaine-induced mood disorder ([place]) [F14.94] 08/14/2015  . Cocaine abuse with cocaine-induced mood disorder ([place]) [F14.14] 08/14/2015  . Gout [M10.9] 07/10/2015  . CKD (chronic kidney disease) stage 3, GFR 30-59 ml/min ([name]) [N18.3] 03/06/2015  . Normocytic anemia [D64.9] 03/06/2015  . Hypoglycemia [E16.2]   . Encounter for general adult medical examination with abnormal findings [Z00.01] 02/09/2015  . Cocaine use disorder, severe, dependence ([place]) [F14.20] 12/13/2014  . Substance or medication-induced depressive disorder with onset during withdrawal [facility]) [X51.700, F19.24, F32.89] 12/13/2014  . Severe recurrent major depressive disorder with psychotic features ([place]) [F33.3] 12/12/2014  . Cervicalgia [M54.2] 06/28/2014  . Lumbar radiculopathy, chronic [M54.16] 06/28/2014  . Asthma, chronic [J45.909] 02/03/2014  . 3-vessel CAD [I25.10] 06/24/2013  . ED (erectile dysfunction) of organic origin [N52.9] 07/07/2012  . Hypertension goal BP (blood pressure) < 140/80 [I10] 04/29/2012  . Chondromalacia of left knee [M94.262] 03/19/2012  . Hyperlipidemia with target LDL less than 100 [E78.5] 02/12/2012  . Fibromyalgia [M79.7] 02/12/2012  . HIV (human immunodeficiency virus infection) ([place]) [B20] 08/27/2011  . Uncontrolled type 2 diabetes with neuropathy ([place]) [E11.40, E11.65] 10/17/2000   Total Time spent with patient: 30 minutes  Past Psychiatric History: see H&P  Past Medical History:  Past Medical History:  Diagnosis Date  . A-fib ([place])   . Asthma    No PFTs, history of childhood asthma  . CAD (coronary artery disease)    a. 06/2013 STEMI/PCI ([facility]): LAD w/  thrombus (treated with BMS), mid 75%, D2 75%; LCX OM2 75%; RCA small, PDA 95%, PLV 95%;  b. 10/2013 Cath/PCI: ISR w/in LAD (Promus DES x 2), borderline OM2 lesion;  c. 01/2014 MV: Intermediate risk, medium-sized distal ant wall infarct w/ very small  amt of peri-infarct ischemia. EF 60%.  . Cellulitis 04/2014   left facial  . Chondromalacia of medial femoral condyle    Left knee MRI 04/28/12: Chondromalacia of the medial femoral condyle with slight peripheral degeneration of the meniscocapsular junction of the medial meniscus; followed by sports medicine  . Collagen vascular disease ([place])   . Crack cocaine use    for 20+ years, has been enrolled in detox programs in the past  . Depression    with history of hospitalization for suicidal ideation  . Diabetes mellitus 2002   Diagnosed in 2002, started insulin in 2012  . Gout   . Gout 04/28/2012  . Headache(784.0)    CT head 08/2011: Periventricular and subcortical white matter hypodensities are most in keeping with chronic microangiopathic change  . HIV infection [facility]) Nov 2012   Followed by [name]  . Hyperlipidemia   . Hypertension   . Pulmonary embolism [facility])     Past Surgical History:  Procedure Laterality Date  . BACK SURGERY     1988  . BOWEL RESECTION    . CARDIAC SURGERY    . CERVICAL SPINE SURGERY     " rods in my neck "  . CORONARY STENT PLACEMENT    . NM MYOCAR PERF WALL MOTION  12/27/2011   normal  . SPINE SURGERY     Family History:  Family History  Problem Relation Age of Onset  . Diabetes Mother   . Hypertension Mother   . Hyperlipidemia Mother   . Diabetes Father   . Cancer Father   . Hypertension Father   . Diabetes Brother   . Heart disease Brother   . Diabetes Sister   . Colon cancer Neg Hx    Family Psychiatric  History: see H&P Social History:  Social History   Substance and Sexual Activity  Alcohol Use No  . Alcohol/week: 2.4 oz  . Types: 2 Cans of beer, 2 Shots of liquor per week     Social History   Substance and Sexual Activity  Drug Use Yes  . Types: "Crack" cocaine, Cocaine   Comment: last use January 2018    Social History   Socioeconomic History  . Marital status: Divorced    Spouse name: None  . Number of children:  None  . Years of education: 55  . Highest education level: None  Social Needs  . Financial resource strain: None  . Food insecurity - worry: None  . Food insecurity - inability: None  . Transportation needs - medical: None  . Transportation needs - non-medical: None  Occupational History    Employer: UNEMPLOYED    Comment: 04/2016  Tobacco Use  . Smoking status: Never Smoker  . Smokeless tobacco: Never Used  Substance and Sexual Activity  . Alcohol use: No    Alcohol/week: 2.4 oz    Types: 2 Cans of beer, 2 Shots of liquor per week  . Drug use: Yes    Types: "Crack" cocaine, Cocaine    Comment: last use January 2018  . Sexual activity: Yes    Comment: accepted condoms  Other Topics Concern  . None  Social History Narrative   Currently staying with a friend in [place].  Was staying @ local motel until a few days ago - left b/c of bed bugs.   Additional Social History:                         Sleep: Good  Appetite:  Good  Current Medications: Current Facility-Administered Medications  Medication Dose Route Frequency Provider Last Rate Last Dose  . acetaminophen (TYLENOL) tablet 650 mg  650 mg Oral Q6H PRN Ethelene Hal, NP   650 mg at 11/20/17 0829  . albuterol (PROVENTIL HFA;VENTOLIN HFA) 108 (90 Base) MCG/ACT inhaler 1 puff  1 puff Inhalation Q6H PRN Nwoko, Agnes I, NP      . allopurinol (ZYLOPRIM) tablet 300 mg  300 mg Oral Daily Nwoko, Agnes I, NP   300 mg at 11/20/17 0824  . alum & mag hydroxide-simeth (MAALOX/MYLANTA) 200-200-20 MG/5ML suspension 30 mL  30 mL Oral Q4H PRN Ethelene Hal, NP      . diltiazem (CARDIZEM CD) 24 hr capsule 120 mg  120 mg Oral Daily Lindell Spar I, NP   120 mg at 11/20/17 0824  . emtricitabine-rilpivir-tenofovir AF (ODEFSEY) 200-25-25 MG per tablet 1 tablet  1 tablet Oral Q breakfast Ethelene Hal, NP   1 tablet at 11/20/17 [phone]  . famotidine (PEPCID) tablet 20 mg  20 mg Oral Daily Ethelene Hal, NP   20  mg at 11/20/17 1200  . gabapentin (NEURONTIN) capsule 400 mg  400 mg Oral BID Ethelene Hal, NP   400 mg at 11/20/17 5681  . hydrochlorothiazide (MICROZIDE) capsule 12.5 mg  12.5 mg Oral Daily Nwoko, Herbert Pun I, NP   12.5 mg at 11/20/17 2751  . hydrOXYzine (ATARAX/VISTARIL) tablet 25 mg  25 mg Oral TID PRN Ethelene Hal, NP      . insulin aspart (novoLOG) injection 0-15 Units  0-15 Units Subcutaneous TID WC Lindell Spar I, NP   3 Units at 11/20/17 1200  . insulin aspart (novoLOG) injection 5 Units  5 Units Subcutaneous TID WC Nwoko, Agnes I, NP   5 Units at 11/20/17 1200  . insulin glargine (LANTUS) injection 30 Units  30 Units Subcutaneous QHS Lindell Spar I, NP   30 Units at 11/19/17 2217  . isosorbide mononitrate (IMDUR) 24 hr tablet 30 mg  30 mg Oral Daily Lindell Spar I, NP   30 mg at 11/20/17 0824  . losartan (COZAAR) tablet 50 mg  50 mg Oral Daily Ethelene Hal, NP   50 mg at 11/20/17 7001  . magnesium hydroxide (MILK OF MAGNESIA) suspension 30 mL  30 mL Oral Daily PRN Ethelene Hal, NP      . metFORMIN (GLUCOPHAGE) tablet 1,000 mg  1,000 mg Oral BID WC Lindell Spar I, NP   1,000 mg at 11/20/17 0823  . ondansetron (ZOFRAN) tablet 4 mg  4 mg Oral Q8H PRN Ethelene Hal, NP      . pravastatin (PRAVACHOL) tablet 40 mg  40 mg Oral QHS Lindell Spar I, NP   40 mg at 11/19/17 2215  . sertraline (ZOLOFT) tablet 50 mg  50 mg Oral Daily Lindell Spar I, NP   50 mg at 11/20/17 0823  . traZODone (DESYREL) tablet 50 mg  50 mg Oral QHS PRN Ethelene Hal, NP   50 mg at 11/19/17 2215    Lab Results:  Results for orders placed or performed during the hospital encounter of 11/18/17 (from the past 48 hour(s))  Glucose, capillary  Status: Abnormal   Collection Time: 11/18/17  5:19 PM  Result Value Ref Range   Glucose-Capillary 341 (H) 65 - 99 mg/dL  Glucose, capillary     Status: Abnormal   Collection Time: 11/18/17  7:37 PM  Result Value Ref Range    Glucose-Capillary 349 (H) 65 - 99 mg/dL  Glucose, capillary     Status: Abnormal   Collection Time: 11/19/17  5:12 AM  Result Value Ref Range   Glucose-Capillary 300 (H) 65 - 99 mg/dL  Glucose, capillary     Status: Abnormal   Collection Time: 11/19/17 12:08 PM  Result Value Ref Range   Glucose-Capillary 109 (H) 65 - 99 mg/dL  Glucose, capillary     Status: Abnormal   Collection Time: 11/19/17  5:23 PM  Result Value Ref Range   Glucose-Capillary 237 (H) 65 - 99 mg/dL  Glucose, capillary     Status: Abnormal   Collection Time: 11/19/17  9:25 PM  Result Value Ref Range   Glucose-Capillary 113 (H) 65 - 99 mg/dL   Comment 1 Notify RN    Comment 2 Document in Chart   Glucose, capillary     Status: Abnormal   Collection Time: 11/20/17  6:05 AM  Result Value Ref Range   Glucose-Capillary 208 (H) 65 - 99 mg/dL   Comment 1 Notify RN    Comment 2 Document in Chart   Glucose, capillary     Status: Abnormal   Collection Time: 11/20/17 12:06 PM  Result Value Ref Range   Glucose-Capillary 164 (H) 65 - 99 mg/dL   Comment 1 Notify RN    Comment 2 Document in Chart     Blood Alcohol level:  Lab Results  Component Value Date   ETH <10 11/18/2017   ETH <5 10/96/0454    Metabolic Disorder Labs: Lab Results  Component Value Date   HGBA1C 10.6 12/26/2016   MPG 189 12/15/2014   MPG 280 (H) 05/01/2014   No results found for: PROLACTIN Lab Results  Component Value Date   CHOL 188 10/14/2017   TRIG 113 10/14/2017   HDL 64 10/14/2017   CHOLHDL 2.9 10/14/2017   VLDL 24 01/08/2016   LDLCALC 75 12/26/2016   LDLCALC 128 01/08/2016    Physical Findings: AIMS: Facial and Oral Movements Muscles of Facial Expression: None, normal Lips and Perioral Area: None, normal Jaw: None, normal Tongue: None, normal,Extremity Movements Upper (arms, wrists, hands, fingers): None, normal Lower (legs, knees, ankles, toes): None, normal, Trunk Movements Neck, shoulders, hips: None, normal, Overall  Severity Severity of abnormal movements (highest score from questions above): None, normal Incapacitation due to abnormal movements: None, normal Patient's awareness of abnormal movements (rate only patient's report): No Awareness, Dental Status Current problems with teeth and/or dentures?: No Does patient usually wear dentures?: No  CIWA:    COWS:     Musculoskeletal: Strength & Muscle Tone: within normal limits Gait & Station: normal Patient leans: N/A  Psychiatric Specialty Exam: Physical Exam  Nursing note and vitals reviewed.   Review of Systems  Constitutional: Negative for chills and fever.  Respiratory: Negative for cough and shortness of breath.   Cardiovascular: Negative for chest pain.  Gastrointestinal: Negative for abdominal pain, heartburn, nausea and vomiting.  Psychiatric/Behavioral: Positive for depression. Negative for hallucinations and suicidal ideas. The patient is nervous/anxious.     Blood pressure 118/69, pulse 83, temperature 97.8 F (36.6 C), temperature source Oral, resp. rate 18, height 5' 5.35" (1.66 m), weight 81.1 kg (178  lb 12.8 oz).Body mass index is 29.43 kg/m.  General Appearance: Casual and Fairly Groomed  Eye Contact:  Good  Speech:  Clear and Coherent and Normal Rate  Volume:  Normal  Mood:  Depressed  Affect:  Appropriate, Congruent and Constricted  Thought Process:  Coherent and Goal Directed  Orientation:  Full (Time, Place, and Person)  Thought Content:  Logical  Suicidal Thoughts:  Yes.  without intent/plan  Homicidal Thoughts:  No  Memory:  Immediate;   Fair Recent;   Fair Remote;   Fair  Judgement:  Fair  Insight:  Fair  Psychomotor Activity:  Normal  Concentration:  Concentration: Fair  Recall:  [employer] of Knowledge:  Fair  Language:  Fair  Akathisia:  No  Handed:    AIMS (if indicated):     Assets:  Communication Skills Resilience Social Support  ADL's:  Intact  Cognition:  WNL  Sleep:  Number of Hours: 5.75    Treatment Plan Summary: Daily contact with patient to assess and evaluate symptoms and progress in treatment and Medication management. Pt reports improving mood symptoms, anxiety, and SI. He is tolerating his medications well. He is unable to safety plan for discharge today, but he feels that plan to discharge to [facility] on 2/18 would be appropriate goal to aim for.  - Continue inpatient hospitalization  -MDD              -Continue zoloft 50mg  po qDay  - Anxiety              -Continue atarax 25mg  po TID prn anxiety             - Continue gabapentin 400mg  po BID  -Insomnia              - Continue trazodone 50mg  po qhs prn insomnia  -HLD             - Continue pravastatin  -DMII             - Continue metformin, SSI, HCTZ  -HTN             - Continue losartan, Imdur  - Continue all other home medications without changes  - Encourage participation in groups and the therapeutic milieu  -Discharge planning will be ongoing   Pennelope Bracken, MD 11/20/2017, 1:53 PM

## 2017-11-20 NOTE — Plan of Care (Signed)
Pt has been out in the dayroom, less isolative this afternoon.

## 2017-11-21 DIAGNOSIS — Z79899 Other long term (current) drug therapy: Secondary | ICD-10-CM

## 2017-11-21 DIAGNOSIS — F1414 Cocaine abuse with cocaine-induced mood disorder: Secondary | ICD-10-CM

## 2017-11-21 DIAGNOSIS — F333 Major depressive disorder, recurrent, severe with psychotic symptoms: Secondary | ICD-10-CM

## 2017-11-21 LAB — GLUCOSE, CAPILLARY
GLUCOSE-CAPILLARY: 134 mg/dL — AB (ref 65–99)
Glucose-Capillary: 139 mg/dL — ABNORMAL HIGH (ref 65–99)
Glucose-Capillary: 170 mg/dL — ABNORMAL HIGH (ref 65–99)
Glucose-Capillary: 204 mg/dL — ABNORMAL HIGH (ref 65–99)

## 2017-11-21 NOTE — Progress Notes (Signed)
[facility] MD Progress Note  11/21/2017 1:48 PM Michael [name]  MRN:  [id number]  Subjective: Michael [name] reports, "I'm feeling a little better. I'm trying to work on things to improve my life. I will be going to [name] after discharge. I still feel suicidal. I'm not going to hurt myself. I feel safe here".   Objective: Michael [name] is a 67 y/o M with history of MDD, cocaine use disorder, and multiple medical problems including recent MI, HIV, DMII, and A Fib who presented voluntarily with worsening symptoms of depression, SI to overdose on crack cocaine, AH, and worsening substance use of cocaine. Pt also reported being off of his medications for over a month. Michael [name] agreed to be restarted on sertraline, and Michael [name] was admitted to [name] for additional treatment and stabilization.  Upon evaluation today 11-21-17, [name] is seen, chart reviewed. The chart findings discussed with the treatment team. Michael [name] is alert, oriented x 4 & aware of situation. Michael [name] is visible on the unit & participating in the group sessions. pt shares, "I'm feeling a little better today. Michael [name] says Michael [name] is still having suicidal ideations without any plans or intent to hurt himself. Michael [name] says Michael [name] feels safe here. Michael [name] denies HI/AH/VH. Michael [name] slept well overnight. His appetite is good. Michael [name] denies any physical complaints. Michael [name] is tolerating his medications without difficulty or side effects. Pt agrees to continue his current regimen without changes. The attending psychiatrist is had dcussed with patient about disposition, and pt confirms that his plan is still to go [name]18, and Michael [name] thinks that Michael [name] will likely be safe to discharge at that time. Pt was asked if Michael [name] could discharge earlier, such as today, and Michael [name] replied, "I don't think I could right now because it would be too overwhelming and I would just go back to using and I'd be right back where I started when I came in." Pt is in agreement to continue his current treatment  regimen. We will continue his admission as Michael [name] is unable to engage in meaningful safety planning today and Michael [name] is still having SI which are improving.   Principal Problem: Severe recurrent [name] depressive disorder with psychotic features (Michael [name]) Diagnosis:   Patient Active Problem List   Diagnosis Date Noted  . Severe recurrent Michael [name] depressive disorder with psychotic features (Michael [name]) [F33.3] 12/12/2014    Priority: High  . Cocaine abuse with cocaine-induced mood disorder [name]) [F14.14] 08/14/2015    Priority: Medium  . Cocaine use disorder, severe, dependence ([name]) [F14.20] 12/13/2014    Priority: Medium  . Hepatitis C [B19.20] 12/30/2016  . Back pain [M54.9] 04/18/2016  . S/P carotid endarterectomy [Z98.890] 11/15/2015  . MDD (Michael [name] depressive disorder), recurrent severe, without psychosis (Michael [name]) [F33.2] 09/09/2015  . Cocaine-induced mood disorder (Michael [name]) [F14.94] 08/14/2015  . Gout [M10.9] 07/10/2015  . CKD (chronic kidney disease) stage 3, GFR 30-59 ml/min ([name]) [N18.3] 03/06/2015  . Normocytic anemia [D64.9] 03/06/2015  . Hypoglycemia [E16.2]   . Encounter for general adult medical examination with abnormal findings [Z00.01] 02/09/2015  . Substance or medication-induced depressive disorder with onset during withdrawal [name]) [X32.355, F19.24, F32.89] 12/13/2014  . Cervicalgia [M54.2] 06/28/2014  . Lumbar radiculopathy, chronic [M54.16] 06/28/2014  . Asthma, chronic [J45.909] 02/03/2014  . 3-vessel CAD [I25.10] 06/24/2013  . ED (erectile dysfunction) of organic origin [N52.9] 07/07/2012  . Hypertension goal BP (blood pressure) < 140/80 [I10] 04/29/2012  . Chondromalacia of left knee [M94.262] 03/19/2012  . Hyperlipidemia with target  LDL less than 100 [E78.5] 02/12/2012  . Fibromyalgia [M79.7] 02/12/2012  . HIV (human immunodeficiency virus infection) (Michael [name]) [B20] 08/27/2011  . Uncontrolled type 2 diabetes with neuropathy (Michael [name]) [E11.40, E11.65] 10/17/2000   Total Time spent with patient:  15 minutes  Past Psychiatric History: See H&P  Past Medical History:  Past Medical History:  Diagnosis Date  . A-fib (Michael [name])   . Asthma    No PFTs, history of childhood asthma  . CAD (coronary artery disease)    a. 06/2013 STEMI/PCI ([facility]): LAD w/ thrombus (treated with BMS), mid 75%, D2 75%; LCX OM2 75%; RCA small, PDA 95%, PLV 95%;  b. 10/2013 Cath/PCI: ISR w/in LAD (Promus DES x 2), borderline OM2 lesion;  c. 01/2014 MV: Intermediate risk, medium-sized distal ant wall infarct w/ very small amt of peri-infarct ischemia. EF 60%.  . Cellulitis 04/2014   left facial  . Chondromalacia of medial femoral condyle    Left knee MRI 04/28/12: Chondromalacia of the medial femoral condyle with slight peripheral degeneration of the meniscocapsular junction of the medial meniscus; followed by sports medicine  . Collagen vascular disease (Michael [name])   . Crack cocaine use    for 20+ years, has been enrolled in detox programs in the past  . Depression    with history of hospitalization for suicidal ideation  . Diabetes mellitus 2002   Diagnosed in 2002, started insulin in 2012  . Gout   . Gout 04/28/2012  . Headache(784.0)    CT head 08/2011: Periventricular and subcortical white matter hypodensities are most in keeping with chronic microangiopathic change  . HIV infection Michael [name]) Nov 2012   Followed by [name]  . Hyperlipidemia   . Hypertension   . Pulmonary embolism [name])     Past Surgical History:  Procedure Laterality Date  . BACK SURGERY     1988  . BOWEL RESECTION    . CARDIAC SURGERY    . CERVICAL SPINE SURGERY     " rods in my neck "  . CORONARY STENT PLACEMENT    . NM MYOCAR PERF WALL MOTION  12/27/2011   normal  . SPINE SURGERY     Family History:  Family History  Problem Relation Age of Onset  . Diabetes Mother   . Hypertension Mother   . Hyperlipidemia Mother   . Diabetes Father   . Cancer Father   . Hypertension Father   . Diabetes Brother   . Heart disease Brother   .  Diabetes Sister   . Colon cancer Neg Hx    Family Psychiatric  History: See H&P  Social History:  Social History   Substance and Sexual Activity  Alcohol Use No  . Alcohol/week: 2.4 oz  . Types: 2 Cans of beer, 2 Shots of liquor per week     Social History   Substance and Sexual Activity  Drug Use Yes  . Types: "Crack" cocaine, Cocaine   Comment: last use January 2018    Social History   Socioeconomic History  . Marital status: Divorced    Spouse name: None  . Number of children: None  . Years of education: 74  . Highest education level: None  Social Needs  . Financial resource strain: None  . Food insecurity - worry: None  . Food insecurity - inability: None  . Transportation needs - medical: None  . Transportation needs - non-medical: None  Occupational History    Employer: UNEMPLOYED    Comment: 04/2016  Tobacco Use  .  Smoking status: Never Smoker  . Smokeless tobacco: Never Used  Substance and Sexual Activity  . Alcohol use: No    Alcohol/week: 2.4 oz    Types: 2 Cans of beer, 2 Shots of liquor per week  . Drug use: Yes    Types: "Crack" cocaine, Cocaine    Comment: last use January 2018  . Sexual activity: Yes    Comment: accepted condoms  Other Topics Concern  . None  Social History Narrative   Currently staying with a friend in [place].  Was staying @ local motel until a few days ago - left b/c of bed bugs.   Additional Social History:   Sleep: Good  Appetite:  Good  Current Medications: Current Facility-Administered Medications  Medication Dose Route Frequency Provider Last Rate Last Dose  . acetaminophen (TYLENOL) tablet 650 mg  650 mg Oral Q6H PRN Ethelene Hal, NP   650 mg at 11/20/17 0829  . albuterol (PROVENTIL HFA;VENTOLIN HFA) 108 (90 Base) MCG/ACT inhaler 1 puff  1 puff Inhalation Q6H PRN Lyndsy Gilberto I, NP      . allopurinol (ZYLOPRIM) tablet 300 mg  300 mg Oral Daily Edinson Domeier I, NP   300 mg at 11/21/17 0828  . alum & mag  hydroxide-simeth (MAALOX/MYLANTA) 200-200-20 MG/5ML suspension 30 mL  30 mL Oral Q4H PRN Ethelene Hal, NP      . diltiazem (CARDIZEM CD) 24 hr capsule 120 mg  120 mg Oral Daily Lindell Spar I, NP   120 mg at 11/21/17 0827  . emtricitabine-rilpivir-tenofovir AF (ODEFSEY) 200-25-25 MG per tablet 1 tablet  1 tablet Oral Q breakfast Ethelene Hal, NP   1 tablet at 11/21/17 [phone]  . famotidine (PEPCID) tablet 20 mg  20 mg Oral Daily Ethelene Hal, NP   20 mg at 11/21/17 1204  . gabapentin (NEURONTIN) capsule 400 mg  400 mg Oral BID Ethelene Hal, NP   400 mg at 11/21/17 0827  . hydrochlorothiazide (MICROZIDE) capsule 12.5 mg  12.5 mg Oral Daily Lindell Spar I, NP   12.5 mg at 11/21/17 0830  . hydrOXYzine (ATARAX/VISTARIL) tablet 25 mg  25 mg Oral TID PRN Ethelene Hal, NP      . insulin aspart (novoLOG) injection 0-15 Units  0-15 Units Subcutaneous TID WC Lindell Spar I, NP   3 Units at 11/21/17 1207  . insulin aspart (novoLOG) injection 5 Units  5 Units Subcutaneous TID WC Lindell Spar I, NP   5 Units at 11/21/17 1205  . insulin glargine (LANTUS) injection 30 Units  30 Units Subcutaneous QHS Lindell Spar I, NP   30 Units at 11/20/17 2139  . isosorbide mononitrate (IMDUR) 24 hr tablet 30 mg  30 mg Oral Daily Lindell Spar I, NP   30 mg at 11/21/17 0828  . losartan (COZAAR) tablet 50 mg  50 mg Oral Daily Ethelene Hal, NP   50 mg at 11/21/17 0923  . magnesium hydroxide (MILK OF MAGNESIA) suspension 30 mL  30 mL Oral Daily PRN Ethelene Hal, NP      . metFORMIN (GLUCOPHAGE) tablet 1,000 mg  1,000 mg Oral BID WC Lindell Spar I, NP   1,000 mg at 11/21/17 0827  . ondansetron (ZOFRAN) tablet 4 mg  4 mg Oral Q8H PRN Ethelene Hal, NP      . pravastatin (PRAVACHOL) tablet 40 mg  40 mg Oral QHS Lindell Spar I, NP   40 mg at 11/20/17 2137  . sertraline (  ZOLOFT) tablet 50 mg  50 mg Oral Daily Lindell Spar I, NP   50 mg at 11/21/17 0827  . traZODone  (DESYREL) tablet 50 mg  50 mg Oral QHS PRN Ethelene Hal, NP   50 mg at 11/20/17 2137   Lab Results:  Results for orders placed or performed during the [name] encounter of 11/18/17 (from the past 48 hour(s))  Glucose, capillary     Status: Abnormal   Collection Time: 11/19/17  5:23 PM  Result Value Ref Range   Glucose-Capillary 237 (H) 65 - 99 mg/dL  Glucose, capillary     Status: Abnormal   Collection Time: 11/19/17  9:25 PM  Result Value Ref Range   Glucose-Capillary 113 (H) 65 - 99 mg/dL   Comment 1 Notify RN    Comment 2 Document in Chart   Glucose, capillary     Status: Abnormal   Collection Time: 11/20/17  6:05 AM  Result Value Ref Range   Glucose-Capillary 208 (H) 65 - 99 mg/dL   Comment 1 Notify RN    Comment 2 Document in Chart   Glucose, capillary     Status: Abnormal   Collection Time: 11/20/17 12:06 PM  Result Value Ref Range   Glucose-Capillary 164 (H) 65 - 99 mg/dL   Comment 1 Notify RN    Comment 2 Document in Chart   Glucose, capillary     Status: Abnormal   Collection Time: 11/20/17  4:52 PM  Result Value Ref Range   Glucose-Capillary 212 (H) 65 - 99 mg/dL  Glucose, capillary     Status: Abnormal   Collection Time: 11/20/17  8:43 PM  Result Value Ref Range   Glucose-Capillary 105 (H) 65 - 99 mg/dL   Comment 1 Notify RN    Comment 2 Document in Chart   Glucose, capillary     Status: Abnormal   Collection Time: 11/21/17  6:00 AM  Result Value Ref Range   Glucose-Capillary 139 (H) 65 - 99 mg/dL   Comment 1 Notify RN    Comment 2 Document in Chart   Glucose, capillary     Status: Abnormal   Collection Time: 11/21/17 12:08 PM  Result Value Ref Range   Glucose-Capillary 170 (H) 65 - 99 mg/dL   Blood Alcohol level:  Lab Results  Component Value Date   ETH <10 11/18/2017   ETH <5 25/95/6387   Metabolic Disorder Labs: Lab Results  Component Value Date   HGBA1C 10.6 12/26/2016   MPG 189 12/15/2014   MPG 280 (H) 05/01/2014   No results found  for: PROLACTIN Lab Results  Component Value Date   CHOL 188 10/14/2017   TRIG 113 10/14/2017   HDL 64 10/14/2017   CHOLHDL 2.9 10/14/2017   VLDL 24 01/08/2016   LDLCALC 75 12/26/2016   LDLCALC 128 01/08/2016   Physical Findings: AIMS: Facial and Oral Movements Muscles of Facial Expression: None, normal Lips and Perioral Area: None, normal Jaw: None, normal Tongue: None, normal,Extremity Movements Upper (arms, wrists, hands, fingers): None, normal Lower (legs, knees, ankles, toes): None, normal, Trunk Movements Neck, shoulders, hips: None, normal, Overall Severity Severity of abnormal movements (highest score from questions above): None, normal Incapacitation due to abnormal movements: None, normal Patient's awareness of abnormal movements (rate only patient's report): No Awareness, Dental Status Current problems with teeth and/or dentures?: No Does patient usually wear dentures?: No  CIWA:    COWS:     Musculoskeletal: Strength & Muscle Tone: within normal limits Gait &  Station: normal Patient leans: N/A  Psychiatric Specialty Exam: Physical Exam  Nursing note and vitals reviewed.   Review of Systems  Constitutional: Negative for chills and fever.  Respiratory: Negative for cough and shortness of breath.   Cardiovascular: Negative for chest pain.  Gastrointestinal: Negative for abdominal pain, heartburn, nausea and vomiting.  Psychiatric/Behavioral: Positive for depression. Negative for hallucinations and suicidal ideas. The patient is nervous/anxious.     Blood pressure (!) 141/75, pulse 91, temperature 98.5 F (36.9 C), resp. rate 16, height 5' 5.35" (1.66 m), weight 81.1 kg (178 lb 12.8 oz).Body mass index is 29.43 kg/m.  General Appearance: Casual and Fairly Groomed  Eye Contact:  Good  Speech:  Clear and Coherent and Normal Rate  Volume:  Normal  Mood:  Depressed  Affect:  Appropriate, Congruent and Constricted  Thought Process:  Coherent and Goal Directed   Orientation:  Full (Time, Place, and Person)  Thought Content:  Logical  Suicidal Thoughts:  Yes.  without intent/plan  Homicidal Thoughts:  No  Memory:  Immediate;   Fair Recent;   Fair Remote;   Fair  Judgement:  Fair  Insight:  Fair  Psychomotor Activity:  Normal  Concentration:  Concentration: Fair  Recall:  [employer] of Knowledge:  Fair  Language:  Fair  Akathisia:  No  Handed:    AIMS (if indicated):     Assets:  Communication Skills Resilience Social Support  ADL's:  Intact  Cognition:  WNL  Sleep:  Number of Hours: 5   Treatment Plan Summary: Daily contact with patient to assess and evaluate symptoms and progress in treatment and Medication management. Pt reports improving mood symptoms & anxiety. Still endorsing passive SI, denies any plans or intent. Michael [name] is tolerating his medications well. Michael [name] feels happy with the plan to discharge to [name] on 2/18. Michael [name] says this would be appropriate goal to aim for.  - Continue inpatient hospitalization.  - Will continue today 11/21/2017 plan as below except where it is noted.  -MDD              -Continue zoloft 50 mg po qDay  - Anxiety              -Continue Atarax 25 mg po TID prn anxiety             - Continue Gabapentin 400 mg po BID  -Insomnia              - Continue trazodone 50 mg po qhs prn insomnia  -HLD             - Continue pravastatin  -DMII             - Continue metformin, SSI, HCTZ  -HTN             - Continue losartan, Imdur  - Continue all other home medications without changes  - Encourage participation in groups and the therapeutic milieu  -Discharge planning will be ongoing   Lindell Spar, NP, PMHNP, FNP-BC. 11/21/2017, 1:48 PMPatient ID: Michael [name], male   DOB: 05/31/1951, 67 y.o.   MRN: [id number]

## 2017-11-21 NOTE — Progress Notes (Signed)
Adult Psychoeducational Group Note  Date:  11/21/2017 Time:  1000 Group Topic/Focus:  Recovery Goals:   The focus of this group is to identify appropriate goals for recovery and establish a plan to achieve them.  Participation Level:  Minimal  Participation Quality:  Appropriate and Attentive  Affect:  Appropriate  Cognitive:  Alert  Insight: Appropriate and Good  Engagement in Group:  Engaged  Modes of Intervention:  Discussion, Rapport Building and Support  Additional Comments:  Group focus on stages of change   Michael [name] 11/21/2017, 2:59 PM

## 2017-11-21 NOTE — Progress Notes (Signed)
Pt reports he is doing better.  He states he that he has a place to go, but will probably be here through the weekend.  He stays mostly to himself, but tonight he has been out of his room in the dayroom watching TV with minimal interaction with the other patients.  Pt contracts for safety.  He denies HI/AVH.  He was encouraged to make his needs known to staff.  Pt voices no needs or concerns at this time.  Support and encouragement offered.  Discharge plans are in process.  Safety maintained with q15 minute checks.

## 2017-11-21 NOTE — Progress Notes (Signed)
Recreation Therapy Notes  Date: 11/21/17 Time: 0930 Location: 300 Hall Dayroom  Group Topic: Stress Management  Goal Area(s) Addresses:  Patient will verbalize importance of using healthy stress management.  Patient will identify positive emotions associated with healthy stress management.   Behavioral Response: Engaged  Intervention: Stress Management  Activity :  Body Scan Meditation.  LRT introduced the stress management technique of meditation.  LRT played a meditation from the Calm app that allowed patients to take inventory of any sensations or tension they may have been experiencing.  Education:  Stress Management, Discharge Planning.   Education Outcome: Acknowledges edcuation/In group clarification offered/Needs additional education  Clinical Observations/Feedback: Pt attended group.    Victorino Sparrow, LRT/CTRS         Victorino Sparrow A 11/21/2017 12:34 PM

## 2017-11-21 NOTE — B[facility] Group Notes (Signed)
[place] Group Notes:  (Nursing/MHT/Case Management/Adjunct)  Date:  11/21/2017  Time:  6:42 PM  Type of Therapy:  Nurse Education  Participation Level:  Active  Participation Quality:  Appropriate  Affect:  Appropriate  Cognitive:  Appropriate  Insight:  Appropriate  Engagement in Group:  Engaged  Modes of Intervention:  Discussion and Education  Summary of Progress/Problems:  Nurse discussed the SMART goal setting guide for coping skills and relapse prevention. Nurse also discussed the Relapse Prevention Plan Worksheet and encouraged all patients to fill the worksheet out, individually, thoughtfully and with the goal of staying heathy.   Cheri Kearns 11/21/2017, 6:42 PM

## 2017-11-21 NOTE — Plan of Care (Signed)
Patient has been attending more unit activities. Patient has been able to verbalize his feelings in an appropriate manner. Patient has had no injury, self inflicted or, otherwise observed or reported. Patient has taken medications as prescribed.

## 2017-11-21 NOTE — Progress Notes (Signed)
Data. Patient denies SI/HI/AVH. Verbally contracts for safety on the unit and to come to staff before acting of any self harm thoughts/feelings. Patient reports waking up in the early hours of the morning and not being able to go back to sleep. He reports this made his sleep poor and he feels sluggish. Patient's affect is bright however, and his mood is bright.  Patient interacting well with staff and other patients. On his self assessment patient re[ports 8/10 for anxiety and depression. His goal for today is, "A better life." Action. Emotional support and encouragement offered. Education provided on medication, indications and side effect. Q 15 minute checks done for safety. Response. Safety on the unit maintained through 15 minute checks.  Medications taken as prescribed. Attended groups. Remained calm and appropriate through out shift.

## 2017-11-22 DIAGNOSIS — G4700 Insomnia, unspecified: Secondary | ICD-10-CM

## 2017-11-22 DIAGNOSIS — E119 Type 2 diabetes mellitus without complications: Secondary | ICD-10-CM

## 2017-11-22 DIAGNOSIS — Z7984 Long term (current) use of oral hypoglycemic drugs: Secondary | ICD-10-CM

## 2017-11-22 DIAGNOSIS — F419 Anxiety disorder, unspecified: Secondary | ICD-10-CM

## 2017-11-22 DIAGNOSIS — I10 Essential (primary) hypertension: Secondary | ICD-10-CM

## 2017-11-22 DIAGNOSIS — F329 Major depressive disorder, single episode, unspecified: Secondary | ICD-10-CM

## 2017-11-22 DIAGNOSIS — E785 Hyperlipidemia, unspecified: Secondary | ICD-10-CM

## 2017-11-22 LAB — GLUCOSE, CAPILLARY
GLUCOSE-CAPILLARY: 136 mg/dL — AB (ref 65–99)
GLUCOSE-CAPILLARY: 217 mg/dL — AB (ref 65–99)
Glucose-Capillary: 139 mg/dL — ABNORMAL HIGH (ref 65–99)
Glucose-Capillary: 178 mg/dL — ABNORMAL HIGH (ref 65–99)

## 2017-11-22 NOTE — B[facility] Group Notes (Signed)
Orientation /  Goals  Date:  11/22/2017  Time:  5:54 PM  Type of Therapy:  Nurse Education  /  The group focuses on teaching patients who their staff is, what their staff responsibilities are and what the unit programming / scheduling looks like.   Participation Level:  Active  Participation Quality:  Attentive  Affect:  Appropriate  Cognitive:  Alert  Insight:  Appropriate  Engagement in Group:  Engaged  Modes of Intervention:  Activity  Summary of Progress/Problems:  Michael [name] 11/22/2017, 5:54 PM

## 2017-11-22 NOTE — Progress Notes (Signed)
Patient denies SI, HI and AVH.  Patient attended groups, engaged in unit activities and had no incidents of behavioral dsycontrol.   Assess patient for safety, offer medications as prescribed, engage patient in 1:1 therapeutic discussions.   Patient able to contract for safety. Continue to monitor as planned.

## 2017-11-22 NOTE — Progress Notes (Signed)
D.  Pt pleasant on approach, denies complaints at this time.  Pt was positive for evening AA group, observed engaged in minimal but appropriate interaction with peers on the unit.  Pt denies SI/HI/AVH at this time.  A.  Support and encouragement offered, medication given as ordered  R.  Pt remains safe on the unit, will continue to monitor.

## 2017-11-22 NOTE — [facility] Group Notes (Signed)
LCSW Group Therapy Note  11/22/2017 9:30-10:30AM - [address], 10:30-11:30 - [address], 11:30-12:00 - 500 [address]  Type of Therapy and Topic:  Group Therapy: Anger Cues and Responses  Participation Level:  Active   Description of Group:   In this group, patients learned how to recognize the physical, cognitive, emotional, and behavioral responses they have to anger-provoking situations.  They identified a recent time they became angry and how they reacted.  They analyzed how their reaction was possibly beneficial and how it was possibly unhelpful.  The group discussed a variety of healthier coping skills that could help with such a situation in the future.  Deep breathing was practiced briefly.  Therapeutic Goals: 1. Patients will remember their last incident of anger and how they felt emotionally and physically, what their thoughts were at the time, and how they behaved. 2. Patients will identify how their behavior at that time worked for them, as well as how it worked against them. 3. Patients will explore possible new behaviors to use in future anger situations. 4. Patients will learn that anger itself is normal and cannot be eliminated, and that healthier reactions can assist with resolving conflict rather than worsening situations.  Summary of Patient Progress:  The patient shared that their most recent time of anger was about 10 day ago when he left the [facility], having been giving them money to put into savings for him, only to be given a check for his deposits that was from a bank that has gone out of business.  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  He talked about taking a deep breath to calm down, and how he could have handled it in less healthy ways.  Therapeutic Modalities:   Cognitive Behavioral Therapy  Maretta Los  11/22/2017 8:29 AM

## 2017-11-22 NOTE — Progress Notes (Signed)
Patient ID: [name], male   DOB: [birth year], 67 y.o.   MRN: [id number]   [facility] MD Progress Note  11/22/2017 12:10 PM Michael [name]  MRN:  [id number]  Subjective: Michael [name] reports, "I'm feeling up and down, like I just smoked cocaine, not good."  Objective: Michael [name] is a 67 y/o M with history of MDD, cocaine use disorder, and multiple medical problems including recent MI, HIV, DMII, and A Fib who presented voluntarily with worsening symptoms of depression, SI to overdose on crack cocaine, AH, and worsening substance use of cocaine. Pt reports going to the rescue mission in [place] on Monday and feels positive about it.  Appetite is "fair" along with "sleep", tired at this time.  8/10 depression,7/10 anxiety  Principal Problem: Cocaine abuse with cocaine-induced mood disorder ([place]) Diagnosis:   Patient Active Problem List   Diagnosis Date Noted  . Cocaine abuse with cocaine-induced mood disorder [facility]) [F14.14] 08/14/2015    Priority: High  . Cocaine use disorder, severe, dependence ([place]) [F14.20] 12/13/2014    Priority: High  . Hepatitis C [B19.20] 12/30/2016  . Back pain [M54.9] 04/18/2016  . S/P carotid endarterectomy [Z98.890] 11/15/2015  . MDD (major depressive disorder), recurrent severe, without psychosis ([place]) [F33.2] 09/09/2015  . Cocaine-induced mood disorder ([place]) [F14.94] 08/14/2015  . Gout [M10.9] 07/10/2015  . CKD (chronic kidney disease) stage 3, GFR 30-59 ml/min ([name]) [N18.3] 03/06/2015  . Normocytic anemia [D64.9] 03/06/2015  . Hypoglycemia [E16.2]   . Encounter for general adult medical examination with abnormal findings [Z00.01] 02/09/2015  . Substance or medication-induced depressive disorder with onset during withdrawal [facility]) [Y81.448, F19.24, F32.89] 12/13/2014  . Severe recurrent major depressive disorder with psychotic features ([place]) [F33.3] 12/12/2014  . Cervicalgia [M54.2] 06/28/2014  . Lumbar radiculopathy, chronic [M54.16] 06/28/2014  . Asthma,  chronic [J45.909] 02/03/2014  . 3-vessel CAD [I25.10] 06/24/2013  . ED (erectile dysfunction) of organic origin [N52.9] 07/07/2012  . Hypertension goal BP (blood pressure) < 140/80 [I10] 04/29/2012  . Chondromalacia of left knee [M94.262] 03/19/2012  . Hyperlipidemia with target LDL less than 100 [E78.5] 02/12/2012  . Fibromyalgia [M79.7] 02/12/2012  . HIV (human immunodeficiency virus infection) ([place]) [B20] 08/27/2011  . Uncontrolled type 2 diabetes with neuropathy ([place]) [E11.40, E11.65] 10/17/2000   Total Time spent with patient: 30 minutes  Past Psychiatric History: See H&P  Past Medical History:  Past Medical History:  Diagnosis Date  . A-fib ([place])   . Asthma    No PFTs, history of childhood asthma  . CAD (coronary artery disease)    a. 06/2013 STEMI/PCI ([facility]): LAD w/ thrombus (treated with BMS), mid 75%, D2 75%; LCX OM2 75%; RCA small, PDA 95%, PLV 95%;  b. 10/2013 Cath/PCI: ISR w/in LAD (Promus DES x 2), borderline OM2 lesion;  c. 01/2014 MV: Intermediate risk, medium-sized distal ant wall infarct w/ very small amt of peri-infarct ischemia. EF 60%.  . Cellulitis 04/2014   left facial  . Chondromalacia of medial femoral condyle    Left knee MRI 04/28/12: Chondromalacia of the medial femoral condyle with slight peripheral degeneration of the meniscocapsular junction of the medial meniscus; followed by sports medicine  . Collagen vascular disease ([place])   . Crack cocaine use    for 20+ years, has been enrolled in detox programs in the past  . Depression    with history of hospitalization for suicidal ideation  . Diabetes mellitus 2002   Diagnosed in 2002, started insulin in 2012  . Gout   . Gout  04/28/2012  . Headache(784.0)    CT head 08/2011: Periventricular and subcortical white matter hypodensities are most in keeping with chronic microangiopathic change  . HIV infection  [facility]) Nov 2012   Followed by [name]  . Hyperlipidemia   . Hypertension   . Pulmonary embolism [facility])      Past Surgical History:  Procedure Laterality Date  . BACK SURGERY     1988  . BOWEL RESECTION    . CARDIAC SURGERY    . CERVICAL SPINE SURGERY     " rods in my neck "  . CORONARY STENT PLACEMENT    . NM MYOCAR PERF WALL MOTION  12/27/2011   normal  . SPINE SURGERY     Family History:  Family History  Problem Relation Age of Onset  . Diabetes Mother   . Hypertension Mother   . Hyperlipidemia Mother   . Diabetes Father   . Cancer Father   . Hypertension Father   . Diabetes Brother   . Heart disease Brother   . Diabetes Sister   . Colon cancer Neg Hx    Family Psychiatric  History: See H&P  Social History:  Social History   Substance and Sexual Activity  Alcohol Use No  . Alcohol/week: 2.4 oz  . Types: 2 Cans of beer, 2 Shots of liquor per week     Social History   Substance and Sexual Activity  Drug Use Yes  . Types: "Crack" cocaine, Cocaine   Comment: last use January 2018    Social History   Socioeconomic History  . Marital status: Divorced    Spouse name: None  . Number of children: None  . Years of education: 55  . Highest education level: None  Social Needs  . Financial resource strain: None  . Food insecurity - worry: None  . Food insecurity - inability: None  . Transportation needs - medical: None  . Transportation needs - non-medical: None  Occupational History    Employer: UNEMPLOYED    Comment: 04/2016  Tobacco Use  . Smoking status: Never Smoker  . Smokeless tobacco: Never Used  Substance and Sexual Activity  . Alcohol use: No    Alcohol/week: 2.4 oz    Types: 2 Cans of beer, 2 Shots of liquor per week  . Drug use: Yes    Types: "Crack" cocaine, Cocaine    Comment: last use January 2018  . Sexual activity: Yes    Comment: accepted condoms  Other Topics Concern  . None  Social History Narrative   Currently staying with a friend in [place].  Was staying @ local motel until a few days ago - left b/c of bed bugs.   Additional Social  History:   Sleep: Good  Appetite:  Good  Current Medications: Current Facility-Administered Medications  Medication Dose Route Frequency Provider Last Rate Last Dose  . acetaminophen (TYLENOL) tablet 650 mg  650 mg Oral Q6H PRN Ethelene Hal, NP   650 mg at 11/22/17 0831  . albuterol (PROVENTIL HFA;VENTOLIN HFA) 108 (90 Base) MCG/ACT inhaler 1 puff  1 puff Inhalation Q6H PRN Nwoko, Agnes I, NP      . allopurinol (ZYLOPRIM) tablet 300 mg  300 mg Oral Daily Nwoko, Agnes I, NP   300 mg at 11/22/17 0830  . alum & mag hydroxide-simeth (MAALOX/MYLANTA) 200-200-20 MG/5ML suspension 30 mL  30 mL Oral Q4H PRN Ethelene Hal, NP      . diltiazem (CARDIZEM CD) 24 hr capsule 120  mg  120 mg Oral Daily Lindell Spar I, NP   120 mg at 11/22/17 0830  . emtricitabine-rilpivir-tenofovir AF (ODEFSEY) 200-25-25 MG per tablet 1 tablet  1 tablet Oral Q breakfast Ethelene Hal, NP   1 tablet at 11/22/17 8341  . famotidine (PEPCID) tablet 20 mg  20 mg Oral Daily Ethelene Hal, NP   20 mg at 11/22/17 1157  . gabapentin (NEURONTIN) capsule 400 mg  400 mg Oral BID Ethelene Hal, NP   400 mg at 11/22/17 9622  . hydrochlorothiazide (MICROZIDE) capsule 12.5 mg  12.5 mg Oral Daily Lindell Spar I, NP   12.5 mg at 11/22/17 2979  . hydrOXYzine (ATARAX/VISTARIL) tablet 25 mg  25 mg Oral TID PRN Ethelene Hal, NP   25 mg at 11/21/17 2129  . insulin aspart (novoLOG) injection 0-15 Units  0-15 Units Subcutaneous TID WC Lindell Spar I, NP   2 Units at 11/22/17 1156  . insulin aspart (novoLOG) injection 5 Units  5 Units Subcutaneous TID WC Lindell Spar I, NP   5 Units at 11/22/17 1156  . insulin glargine (LANTUS) injection 30 Units  30 Units Subcutaneous QHS Lindell Spar I, NP   30 Units at 11/21/17 2153  . isosorbide mononitrate (IMDUR) 24 hr tablet 30 mg  30 mg Oral Daily Nwoko, Agnes I, NP   30 mg at 11/22/17 0830  . losartan (COZAAR) tablet 50 mg  50 mg Oral Daily Ethelene Hal, NP   50 mg at 11/22/17 0830  . magnesium hydroxide (MILK OF MAGNESIA) suspension 30 mL  30 mL Oral Daily PRN Ethelene Hal, NP      . metFORMIN (GLUCOPHAGE) tablet 1,000 mg  1,000 mg Oral BID WC Lindell Spar I, NP   1,000 mg at 11/22/17 0829  . ondansetron (ZOFRAN) tablet 4 mg  4 mg Oral Q8H PRN Ethelene Hal, NP      . pravastatin (PRAVACHOL) tablet 40 mg  40 mg Oral QHS Lindell Spar I, NP   40 mg at 11/21/17 2129  . sertraline (ZOLOFT) tablet 50 mg  50 mg Oral Daily Lindell Spar I, NP   50 mg at 11/22/17 0829  . traZODone (DESYREL) tablet 50 mg  50 mg Oral QHS PRN Ethelene Hal, NP   50 mg at 11/21/17 2129   Lab Results:  Results for orders placed or performed during the hospital encounter of 11/18/17 (from the past 48 hour(s))  Glucose, capillary     Status: Abnormal   Collection Time: 11/20/17  4:52 PM  Result Value Ref Range   Glucose-Capillary 212 (H) 65 - 99 mg/dL  Glucose, capillary     Status: Abnormal   Collection Time: 11/20/17  8:43 PM  Result Value Ref Range   Glucose-Capillary 105 (H) 65 - 99 mg/dL   Comment 1 Notify RN    Comment 2 Document in Chart   Glucose, capillary     Status: Abnormal   Collection Time: 11/21/17  6:00 AM  Result Value Ref Range   Glucose-Capillary 139 (H) 65 - 99 mg/dL   Comment 1 Notify RN    Comment 2 Document in Chart   Glucose, capillary     Status: Abnormal   Collection Time: 11/21/17 12:08 PM  Result Value Ref Range   Glucose-Capillary 170 (H) 65 - 99 mg/dL  Glucose, capillary     Status: Abnormal   Collection Time: 11/21/17  5:04 PM  Result Value Ref Range  Glucose-Capillary 204 (H) 65 - 99 mg/dL  Glucose, capillary     Status: Abnormal   Collection Time: 11/21/17  9:08 PM  Result Value Ref Range   Glucose-Capillary 134 (H) 65 - 99 mg/dL  Glucose, capillary     Status: Abnormal   Collection Time: 11/22/17  6:14 AM  Result Value Ref Range   Glucose-Capillary 139 (H) 65 - 99 mg/dL  Glucose,  capillary     Status: Abnormal   Collection Time: 11/22/17 11:52 AM  Result Value Ref Range   Glucose-Capillary 136 (H) 65 - 99 mg/dL   Blood Alcohol level:  Lab Results  Component Value Date   ETH <10 11/18/2017   ETH <5 37/85/8850   Metabolic Disorder Labs: Lab Results  Component Value Date   HGBA1C 10.6 12/26/2016   MPG 189 12/15/2014   MPG 280 (H) 05/01/2014   No results found for: PROLACTIN Lab Results  Component Value Date   CHOL 188 10/14/2017   TRIG 113 10/14/2017   HDL 64 10/14/2017   CHOLHDL 2.9 10/14/2017   VLDL 24 01/08/2016   LDLCALC 75 12/26/2016   LDLCALC 128 01/08/2016   Physical Findings: AIMS: Facial and Oral Movements Muscles of Facial Expression: None, normal Lips and Perioral Area: None, normal Jaw: None, normal Tongue: None, normal,Extremity Movements Upper (arms, wrists, hands, fingers): None, normal Lower (legs, knees, ankles, toes): None, normal, Trunk Movements Neck, shoulders, hips: None, normal, Overall Severity Severity of abnormal movements (highest score from questions above): None, normal Incapacitation due to abnormal movements: None, normal Patient's awareness of abnormal movements (rate only patient's report): No Awareness, Dental Status Current problems with teeth and/or dentures?: No Does patient usually wear dentures?: No  CIWA:    COWS:     Musculoskeletal: Strength & Muscle Tone: within normal limits Gait & Station: normal Patient leans: N/A  Psychiatric Specialty Exam: Physical Exam  Nursing note and vitals reviewed. Constitutional: He is oriented to person, place, and time. He appears well-developed and well-nourished.  HENT:  Head: Normocephalic.  Neck: Normal range of motion.  Respiratory: Effort normal.  Musculoskeletal: Normal range of motion.  Neurological: He is alert and oriented to person, place, and time.  Psychiatric: His speech is normal and behavior is normal. Thought content normal. His mood appears  anxious. Cognition and memory are impaired. He expresses impulsivity. He exhibits a depressed mood.    Review of Systems  Constitutional: Negative for chills and fever.  Respiratory: Negative for cough and shortness of breath.   Cardiovascular: Negative for chest pain.  Gastrointestinal: Negative for abdominal pain, heartburn, nausea and vomiting.  Psychiatric/Behavioral: Positive for depression. Negative for hallucinations and suicidal ideas. The patient is nervous/anxious.     Blood pressure 137/69, pulse 74, temperature 98.2 F (36.8 C), temperature source Oral, resp. rate 16, height 5' 5.35" (1.66 m), weight 81.1 kg (178 lb 12.8 oz).Body mass index is 29.43 kg/m.  General Appearance: Casual and Fairly Groomed  Eye Contact:  Good  Speech:  Clear and Coherent and Normal Rate  Volume:  Normal  Mood:  Depressed  Affect:  Appropriate, Congruent and Constricted  Thought Process:  Coherent and Goal Directed  Orientation:  Full (Time, Place, and Person)  Thought Content:  Logical  Suicidal Thoughts:  Yes.  without intent/plan  Homicidal Thoughts:  No  Memory:  Immediate;   Fair Recent;   Fair Remote;   Fair  Judgement:  Fair  Insight:  Fair  Psychomotor Activity:  Normal  Concentration:  Concentration:  Fair  Recall:  Smiley Houseman of Knowledge:  Fair  Language:  Fair  Akathisia:  No  Handed:    AIMS (if indicated):     Assets:  Communication Skills Resilience Social Support  ADL's:  Intact  Cognition:  WNL  Sleep:  Number of Hours: 6.75   Treatment Plan Summary: Daily contact with patient to assess and evaluate symptoms and progress in treatment and Medication management. Pt reports improving mood symptoms & anxiety. Still endorsing passive SI, denies any plans or intent. He is tolerating his medications well. He feels happy with the plan to discharge to [facility] on 2/18. He says this would be appropriate goal to aim for.  - Continue inpatient hospitalization.  -  Will continue today 11/22/2017 plan as below except where it is noted.  -MDD              -Continue zoloft 50 mg po qDay  - Anxiety              -Continue Atarax 25 mg po TID prn anxiety             - Continue Gabapentin 400 mg po BID  -Insomnia              - Continue trazodone 50 mg po qhs prn insomnia  -HLD             - Continue pravastatin  -DMII             - Continue metformin, SSI, HCTZ  -HTN             - Continue losartan, Imdur  - Continue all other home medications without changes  - Encourage participation in groups and the therapeutic milieu  -Discharge planning will be ongoing   Waylan Boga, NP, PMHNP, FNP-BC. 11/22/2017, 12:10 PMPatient ID: Michael [name], male   DOB: July 03, 1951, 67 y.o.   MRN: [id number]

## 2017-11-23 LAB — GLUCOSE, CAPILLARY
GLUCOSE-CAPILLARY: 175 mg/dL — AB (ref 65–99)
Glucose-Capillary: 99 mg/dL (ref 65–99)
Glucose-Capillary: 163 mg/dL — ABNORMAL HIGH (ref 65–99)
Glucose-Capillary: 146 mg/dL — ABNORMAL HIGH (ref 65–99)

## 2017-11-23 NOTE — Progress Notes (Signed)
D.  Pt pleasant on approach, denies complaints at this time.  Pt was positive for evening AA group, observed engaged in appropriate interaction with peers on the unit.  Pt denies SI/HI/AVH at this time.  A.  Support and encouragement offered medication given as ordered  R.  Pt remains safe on the unit, will continue to monitor.

## 2017-11-23 NOTE — [facility] Group Notes (Signed)
[place] Group Notes:  (Nursing/MHT/Case Management/Adjunct)  Date:  11/23/2017  Time:  1:49 PM  Type of Therapy:  Psychoeducational Skills  Participation Level:  Did Not Attend  Participation Quality:  Did not attend  Affect:  Did not attend  Cognitive:  Did not attend  Insight:  None  Engagement in Group:  Did not attend  Modes of Intervention:  Did not attend  Summary of Progress/Problems: Pt did not attend Psychoeducational group with topic healthy support systems.     Benancio Deeds Shanta 11/23/2017, 1:49 PM

## 2017-11-23 NOTE — Progress Notes (Signed)
Patient ID: Michael [name], male   DOB: June 12, 1951, 67 y.o.   MRN: [id number]   [facility] MD Progress Note  11/23/2017 10:40 AM Michael [name]  MRN:  [id number]  Subjective: Michael [name] reports, "I'm feeling a little better, ready to get going to the [employer] tomorrow"  Objective: Michael [name] is a 67 y/o M with history of MDD, cocaine use disorder, and multiple medical problems including recent MI, HIV, DMII, and A Fib who presented voluntarily with worsening symptoms of depression, SI to overdose on crack cocaine, AH, and worsening substance use of cocaine. Pt reports going to the rescue mission in [place] on Monday and feels ready to get going.  Brighter affect today with a decrease in depression and anxiety.  6/10 depression,6/10 anxiety  Principal Problem: Cocaine abuse with cocaine-induced mood disorder ([place]) Diagnosis:   Patient Active Problem List   Diagnosis Date Noted  . Cocaine abuse with cocaine-induced mood disorder [facility]) [F14.14] 08/14/2015    Priority: High  . Cocaine use disorder, severe, dependence ([place]) [F14.20] 12/13/2014    Priority: High  . Hepatitis C [B19.20] 12/30/2016  . Back pain [M54.9] 04/18/2016  . S/P carotid endarterectomy [Z98.890] 11/15/2015  . MDD (major depressive disorder), recurrent severe, without psychosis ([place]) [F33.2] 09/09/2015  . Cocaine-induced mood disorder ([place]) [F14.94] 08/14/2015  . Gout [M10.9] 07/10/2015  . CKD (chronic kidney disease) stage 3, GFR 30-59 ml/min ([name]) [N18.3] 03/06/2015  . Normocytic anemia [D64.9] 03/06/2015  . Hypoglycemia [E16.2]   . Encounter for general adult medical examination with abnormal findings [Z00.01] 02/09/2015  . Substance or medication-induced depressive disorder with onset during withdrawal [facility]) [Y60.630, F19.24, F32.89] 12/13/2014  . Severe recurrent major depressive disorder with psychotic features ([place]) [F33.3] 12/12/2014  . Cervicalgia [M54.2] 06/28/2014  . Lumbar radiculopathy, chronic  [M54.16] 06/28/2014  . Asthma, chronic [J45.909] 02/03/2014  . 3-vessel CAD [I25.10] 06/24/2013  . ED (erectile dysfunction) of organic origin [N52.9] 07/07/2012  . Hypertension goal BP (blood pressure) < 140/80 [I10] 04/29/2012  . Chondromalacia of left knee [M94.262] 03/19/2012  . Hyperlipidemia with target LDL less than 100 [E78.5] 02/12/2012  . Fibromyalgia [M79.7] 02/12/2012  . HIV (human immunodeficiency virus infection) ([place]) [B20] 08/27/2011  . Uncontrolled type 2 diabetes with neuropathy ([place]) [E11.40, E11.65] 10/17/2000   Total Time spent with patient: 30 minutes  Past Psychiatric History: See H&P  Past Medical History:  Past Medical History:  Diagnosis Date  . A-fib ([place])   . Asthma    No PFTs, history of childhood asthma  . CAD (coronary artery disease)    a. 06/2013 STEMI/PCI ([facility]): LAD w/ thrombus (treated with BMS), mid 75%, D2 75%; LCX OM2 75%; RCA small, PDA 95%, PLV 95%;  b. 10/2013 Cath/PCI: ISR w/in LAD (Promus DES x 2), borderline OM2 lesion;  c. 01/2014 MV: Intermediate risk, medium-sized distal ant wall infarct w/ very small amt of peri-infarct ischemia. EF 60%.  . Cellulitis 04/2014   left facial  . Chondromalacia of medial femoral condyle    Left knee MRI 04/28/12: Chondromalacia of the medial femoral condyle with slight peripheral degeneration of the meniscocapsular junction of the medial meniscus; followed by sports medicine  . Collagen vascular disease ([place])   . Crack cocaine use    for 20+ years, has been enrolled in detox programs in the past  . Depression    with history of hospitalization for suicidal ideation  . Diabetes mellitus 2002   Diagnosed in 2002, started insulin in 2012  . Gout   .  Gout 04/28/2012  . Headache(784.0)    CT head 08/2011: Periventricular and subcortical white matter hypodensities are most in keeping with chronic microangiopathic change  . HIV infection [facility]) Nov 2012   Followed by [name]  . Hyperlipidemia   . Hypertension    . Pulmonary embolism [facility])     Past Surgical History:  Procedure Laterality Date  . BACK SURGERY     1988  . BOWEL RESECTION    . CARDIAC SURGERY    . CERVICAL SPINE SURGERY     " rods in my neck "  . CORONARY STENT PLACEMENT    . NM MYOCAR PERF WALL MOTION  12/27/2011   normal  . SPINE SURGERY     Family History:  Family History  Problem Relation Age of Onset  . Diabetes Mother   . Hypertension Mother   . Hyperlipidemia Mother   . Diabetes Father   . Cancer Father   . Hypertension Father   . Diabetes Brother   . Heart disease Brother   . Diabetes Sister   . Colon cancer Neg Hx    Family Psychiatric  History: See H&P  Social History:  Social History   Substance and Sexual Activity  Alcohol Use No  . Alcohol/week: 2.4 oz  . Types: 2 Cans of beer, 2 Shots of liquor per week     Social History   Substance and Sexual Activity  Drug Use Yes  . Types: "Crack" cocaine, Cocaine   Comment: last use January 2018    Social History   Socioeconomic History  . Marital status: Divorced    Spouse name: None  . Number of children: None  . Years of education: 10  . Highest education level: None  Social Needs  . Financial resource strain: None  . Food insecurity - worry: None  . Food insecurity - inability: None  . Transportation needs - medical: None  . Transportation needs - non-medical: None  Occupational History    Employer: UNEMPLOYED    Comment: 04/2016  Tobacco Use  . Smoking status: Never Smoker  . Smokeless tobacco: Never Used  Substance and Sexual Activity  . Alcohol use: No    Alcohol/week: 2.4 oz    Types: 2 Cans of beer, 2 Shots of liquor per week  . Drug use: Yes    Types: "Crack" cocaine, Cocaine    Comment: last use January 2018  . Sexual activity: Yes    Comment: accepted condoms  Other Topics Concern  . None  Social History Narrative   Currently staying with a friend in [place].  Was staying @ local motel until a few days ago - left b/c of bed  bugs.   Additional Social History:   Sleep: Good  Appetite:  Good  Current Medications: Current Facility-Administered Medications  Medication Dose Route Frequency Provider Last Rate Last Dose  . acetaminophen (TYLENOL) tablet 650 mg  650 mg Oral Q6H PRN Ethelene Hal, NP   650 mg at 11/22/17 0831  . albuterol (PROVENTIL HFA;VENTOLIN HFA) 108 (90 Base) MCG/ACT inhaler 1 puff  1 puff Inhalation Q6H PRN Nwoko, Agnes I, NP      . allopurinol (ZYLOPRIM) tablet 300 mg  300 mg Oral Daily Nwoko, Agnes I, NP   300 mg at 11/23/17 1002  . alum & mag hydroxide-simeth (MAALOX/MYLANTA) 200-200-20 MG/5ML suspension 30 mL  30 mL Oral Q4H PRN Ethelene Hal, NP      . diltiazem (CARDIZEM CD) 24 hr capsule  120 mg  120 mg Oral Daily Lindell Spar I, NP   120 mg at 11/23/17 1001  . emtricitabine-rilpivir-tenofovir AF (ODEFSEY) 200-25-25 MG per tablet 1 tablet  1 tablet Oral Q breakfast Ethelene Hal, NP   1 tablet at 11/23/17 1002  . famotidine (PEPCID) tablet 20 mg  20 mg Oral Daily Ethelene Hal, NP   20 mg at 11/22/17 1157  . gabapentin (NEURONTIN) capsule 400 mg  400 mg Oral BID Ethelene Hal, NP   400 mg at 11/23/17 1002  . hydrochlorothiazide (MICROZIDE) capsule 12.5 mg  12.5 mg Oral Daily Nwoko, Agnes I, NP   12.5 mg at 11/23/17 1002  . hydrOXYzine (ATARAX/VISTARIL) tablet 25 mg  25 mg Oral TID PRN Ethelene Hal, NP   25 mg at 11/22/17 2140  . insulin aspart (novoLOG) injection 0-15 Units  0-15 Units Subcutaneous TID WC Lindell Spar I, NP   5 Units at 11/22/17 1738  . insulin aspart (novoLOG) injection 5 Units  5 Units Subcutaneous TID WC Lindell Spar I, NP   5 Units at 11/23/17 [phone]  . insulin glargine (LANTUS) injection 30 Units  30 Units Subcutaneous QHS Lindell Spar I, NP   30 Units at 11/22/17 2141  . isosorbide mononitrate (IMDUR) 24 hr tablet 30 mg  30 mg Oral Daily Nwoko, Agnes I, NP   30 mg at 11/23/17 1001  . losartan (COZAAR) tablet 50 mg  50 mg  Oral Daily Ethelene Hal, NP   50 mg at 11/23/17 1003  . magnesium hydroxide (MILK OF MAGNESIA) suspension 30 mL  30 mL Oral Daily PRN Ethelene Hal, NP      . metFORMIN (GLUCOPHAGE) tablet 1,000 mg  1,000 mg Oral BID WC Nwoko, Agnes I, NP   1,000 mg at 11/23/17 1002  . ondansetron (ZOFRAN) tablet 4 mg  4 mg Oral Q8H PRN Ethelene Hal, NP      . pravastatin (PRAVACHOL) tablet 40 mg  40 mg Oral QHS Lindell Spar I, NP   40 mg at 11/22/17 2140  . sertraline (ZOLOFT) tablet 50 mg  50 mg Oral Daily Nwoko, Agnes I, NP   50 mg at 11/23/17 1002  . traZODone (DESYREL) tablet 50 mg  50 mg Oral QHS PRN Ethelene Hal, NP   50 mg at 11/22/17 2140   Lab Results:  Results for orders placed or performed during the hospital encounter of 11/18/17 (from the past 48 hour(s))  Glucose, capillary     Status: Abnormal   Collection Time: 11/21/17 12:08 PM  Result Value Ref Range   Glucose-Capillary 170 (H) 65 - 99 mg/dL  Glucose, capillary     Status: Abnormal   Collection Time: 11/21/17  5:04 PM  Result Value Ref Range   Glucose-Capillary 204 (H) 65 - 99 mg/dL  Glucose, capillary     Status: Abnormal   Collection Time: 11/21/17  9:08 PM  Result Value Ref Range   Glucose-Capillary 134 (H) 65 - 99 mg/dL  Glucose, capillary     Status: Abnormal   Collection Time: 11/22/17  6:14 AM  Result Value Ref Range   Glucose-Capillary 139 (H) 65 - 99 mg/dL  Glucose, capillary     Status: Abnormal   Collection Time: 11/22/17 11:52 AM  Result Value Ref Range   Glucose-Capillary 136 (H) 65 - 99 mg/dL  Glucose, capillary     Status: Abnormal   Collection Time: 11/22/17  5:30 PM  Result Value Ref Range  Glucose-Capillary 217 (H) 65 - 99 mg/dL  Glucose, capillary     Status: Abnormal   Collection Time: 11/22/17  7:24 PM  Result Value Ref Range   Glucose-Capillary 178 (H) 65 - 99 mg/dL  Glucose, capillary     Status: None   Collection Time: 11/23/17  6:19 AM  Result Value Ref Range    Glucose-Capillary 99 65 - 99 mg/dL   Blood Alcohol level:  Lab Results  Component Value Date   ETH <10 11/18/2017   ETH <5 57/32/2025   Metabolic Disorder Labs: Lab Results  Component Value Date   HGBA1C 10.6 12/26/2016   MPG 189 12/15/2014   MPG 280 (H) 05/01/2014   No results found for: PROLACTIN Lab Results  Component Value Date   CHOL 188 10/14/2017   TRIG 113 10/14/2017   HDL 64 10/14/2017   CHOLHDL 2.9 10/14/2017   VLDL 24 01/08/2016   LDLCALC 75 12/26/2016   LDLCALC 128 01/08/2016   Physical Findings: AIMS: Facial and Oral Movements Muscles of Facial Expression: None, normal Lips and Perioral Area: None, normal Jaw: None, normal Tongue: None, normal,Extremity Movements Upper (arms, wrists, hands, fingers): None, normal Lower (legs, knees, ankles, toes): None, normal, Trunk Movements Neck, shoulders, hips: None, normal, Overall Severity Severity of abnormal movements (highest score from questions above): None, normal Incapacitation due to abnormal movements: None, normal Patient's awareness of abnormal movements (rate only patient's report): No Awareness, Dental Status Current problems with teeth and/or dentures?: No Does patient usually wear dentures?: No  CIWA:    COWS:     Musculoskeletal: Strength & Muscle Tone: within normal limits Gait & Station: normal Patient leans: N/A  Psychiatric Specialty Exam: Physical Exam  Nursing note and vitals reviewed. Constitutional: He is oriented to person, place, and time. He appears well-developed and well-nourished.  HENT:  Head: Normocephalic.  Neck: Normal range of motion.  Respiratory: Effort normal.  Musculoskeletal: Normal range of motion.  Neurological: He is alert and oriented to person, place, and time.  Psychiatric: His speech is normal and behavior is normal. Thought content normal. His mood appears anxious. Cognition and memory are impaired. He expresses impulsivity. He exhibits a depressed mood.     Review of Systems  Constitutional: Negative for chills and fever.  Respiratory: Negative for cough and shortness of breath.   Cardiovascular: Negative for chest pain.  Gastrointestinal: Negative for abdominal pain, heartburn, nausea and vomiting.  Psychiatric/Behavioral: Positive for depression. Negative for hallucinations and suicidal ideas. The patient is nervous/anxious.     Blood pressure 134/79, pulse 86, temperature 97.9 F (36.6 C), temperature source Oral, resp. rate 18, height 5' 5.35" (1.66 m), weight 81.1 kg (178 lb 12.8 oz).Body mass index is 29.43 kg/m.  General Appearance: Casual and Fairly Groomed  Eye Contact:  Good  Speech:  Clear and Coherent and Normal Rate  Volume:  Normal  Mood:  Depressed  Affect:  Appropriate, Congruent and Constricted  Thought Process:  Coherent and Goal Directed  Orientation:  Full (Time, Place, and Person)  Thought Content:  Logical  Suicidal Thoughts:  Yes.  without intent/plan  Homicidal Thoughts:  No  Memory:  Immediate;   Fair Recent;   Fair Remote;   Fair  Judgement:  Fair  Insight:  Fair  Psychomotor Activity:  Normal  Concentration:  Concentration: Fair  Recall:  [employer] of Knowledge:  Fair  Language:  Fair  Akathisia:  No  Handed:    AIMS (if indicated):  Assets:  Communication Skills Resilience Social Support  ADL's:  Intact  Cognition:  WNL  Sleep:  Number of Hours: 6.75   Treatment Plan Summary: Daily contact with patient to assess and evaluate symptoms and progress in treatment and Medication management. Pt reports improving mood symptoms & anxiety. Still endorsing passive SI, denies any plans or intent. He is tolerating his medications well. He feels happy with the plan to discharge to [facility] on 2/18. He says this would be appropriate goal to aim for.  - Continue inpatient hospitalization.  - Will continue today 11/23/2017 plan as below except where it is noted.  -MDD              -Continue  zoloft 50 mg po qDay  - Anxiety              -Continue Atarax 25 mg po TID prn anxiety             - Continue Gabapentin 400 mg po BID  -Insomnia              - Continue trazodone 50 mg po qhs prn insomnia  -HLD             - Continue pravastatin  -DMII             - Continue metformin, SSI, HCTZ  -HTN             - Continue losartan, Imdur  - Continue all other home medications without changes  - Encourage participation in groups and the therapeutic milieu  -Discharge planning will be ongoing   Waylan Boga, NP, PMHNP, FNP-BC. 11/23/2017, 10:40 AMPatient ID: Michael [name], male   DOB: 03-14-1951, 67 y.o.   MRN: [id number]

## 2017-11-23 NOTE — [facility] Group Notes (Signed)
[facility] LCSW Group Therapy Note  Date/Time:  11/23/2017 9-10am Type of Therapy and Topic:  Group Therapy:  Healthy and Unhealthy Supports  Participation Level:  Active   Description of Group:  Patients in this group were introduced to the idea of adding a variety of healthy supports to address the various needs in their lives.Patients discussed what additional healthy supports could be helpful in their recovery and wellness after discharge in order to prevent future hospitalizations.   An emphasis was placed on using counselor, doctor, therapy groups, 12-step groups, and problem-specific support groups to expand supports.  They also worked as a group on developing a specific plan for several patients to deal with unhealthy supports through [place], psychoeducation with loved ones, and even termination of relationships.   Therapeutic Goals:   1)  discuss importance of adding supports to stay well once out of the hospital  2)  compare healthy versus unhealthy supports and identify some examples of each  3)  generate ideas and descriptions of healthy supports that can be added  4)  offer mutual support about how to address unhealthy supports  5)  encourage active participation in and adherence to discharge plan    Summary of Patient Progress:  The patient expressed a willingness to add [facility] and a doctor to help in his recovery journey.   Therapeutic Modalities:   Motivational Interviewing Brief Solution-Focused Therapy  Selmer Dominion, LCSW

## 2017-11-23 NOTE — BHH Group Notes (Signed)
[place] Group Notes:  (Nursing/MHT/Case Management/Adjunct)  Date:  11/23/2017  Time:  8:55 AM  Type of Therapy:  Orientation/Goals group  Participation Level:  Did Not Attend  Participation Quality:  Did Not Attend  Affect:  Did Not Attend  Cognitive:  Did Not Attend  Insight:  None  Engagement in Group:  Did Not Attend  Modes of Intervention:  Did Not Attend  Summary of Progress/Problems: Pt did not attend patient self inventory group/orientation group.   Benancio Deeds Shanta 11/23/2017, 8:55 AM

## 2017-11-23 NOTE — Progress Notes (Signed)
Patient did attend the evening speaker AA meeting.  

## 2017-11-23 NOTE — Progress Notes (Signed)
Patient ID: ROLLY MAGRI, male   DOB: October 07, 1951, 67 y.o.   MRN: [id number]    D: Pt has been very flat and depressed on the unit today. Pt remained in the bed most of the day, but did get up to attend groups. Pt refused to fill out his patient self inventory sheet, however reported having little depression and anxiety. Pt reported that he is supposed to be discharged to [facility] rescue [facility] and was requesting samples. This information was passed to the SW. Pt took all medications without any problems, no issues or concerns noted. Pt reported being negative SI/HI, no AH/VH noted. A: 15 min checks continued for patient safety. R: Pt safety maintained.

## 2017-11-23 NOTE — Progress Notes (Signed)
D.  Pt pleasant on approach, denies complaints at this time.  Pt was positive for evening AA group, observed engaged in appropriate interaction with peers on the unit.  Pt denies SI/HI/AVH at this time.  A.  Support and encouragement offered, medications given as ordered  R.  Pt remains safe on the unit, will continue to monitor.

## 2017-11-24 LAB — GLUCOSE, CAPILLARY: GLUCOSE-CAPILLARY: 114 mg/dL — AB (ref 65–99)

## 2017-11-24 MED ORDER — HYDROCHLOROTHIAZIDE 12.5 MG PO CAPS: 12.5000 mg | ORAL_CAPSULE | Freq: Every day | ORAL | 0 refills | Status: DC

## 2017-11-24 MED ORDER — ACCU-CHEK SOFTCLIX LANCETS MISC: 5 refills | Status: DC

## 2017-11-24 MED ORDER — LOSARTAN POTASSIUM 50 MG PO TABS: 50.0000 mg | ORAL_TABLET | Freq: Every day | ORAL | 0 refills | Status: DC

## 2017-11-24 MED ORDER — INSULIN LISPRO 100 UNIT/ML ~~LOC~~ SOLN: 5.0000 [IU] | Freq: Three times a day (TID) | SUBCUTANEOUS | 0 refills | Status: DC

## 2017-11-24 MED ORDER — METFORMIN HCL 1000 MG PO TABS: 1000.0000 mg | ORAL_TABLET | Freq: Two times a day (BID) | ORAL | 0 refills | Status: DC

## 2017-11-24 MED ORDER — TRAZODONE HCL 50 MG PO TABS: 50.0000 mg | ORAL_TABLET | Freq: Every evening | ORAL | 0 refills | Status: DC | PRN

## 2017-11-24 MED ORDER — ALBUTEROL SULFATE HFA 108 (90 BASE) MCG/ACT IN AERS: INHALATION_SPRAY | RESPIRATORY_TRACT | 0 refills | Status: DC

## 2017-11-24 MED ORDER — PRAVASTATIN SODIUM 40 MG PO TABS: 40.0000 mg | ORAL_TABLET | Freq: Every day | ORAL | 0 refills | Status: DC

## 2017-11-24 MED ORDER — "INSULIN SYRINGE-NEEDLE U-100 27G X 5/8"" 1 ML MISC": 1.0000 | Freq: Three times a day (TID) | 0 refills | Status: DC | PRN

## 2017-11-24 MED ORDER — SERTRALINE HCL 50 MG PO TABS: 50.0000 mg | ORAL_TABLET | Freq: Every day | ORAL | 0 refills | Status: DC

## 2017-11-24 MED ORDER — ISOSORBIDE MONONITRATE ER 30 MG PO TB24: 30.0000 mg | ORAL_TABLET | Freq: Every day | ORAL | 0 refills | Status: DC

## 2017-11-24 MED ORDER — HYDROXYZINE HCL 25 MG PO TABS: 25.0000 mg | ORAL_TABLET | Freq: Three times a day (TID) | ORAL | 0 refills | Status: DC | PRN

## 2017-11-24 MED ORDER — INSULIN GLARGINE 100 UNIT/ML ~~LOC~~ SOLN: 30.0000 [IU] | Freq: Every day | SUBCUTANEOUS | 0 refills | Status: DC

## 2017-11-24 MED ORDER — EMTRICITAB-RILPIVIR-TENOFOV AF 200-25-25 MG PO TABS: 1.0000 | ORAL_TABLET | Freq: Every day | ORAL | 0 refills | Status: DC

## 2017-11-24 MED ORDER — GABAPENTIN 400 MG PO CAPS: 400.0000 mg | ORAL_CAPSULE | Freq: Two times a day (BID) | ORAL | 0 refills | Status: DC

## 2017-11-24 MED ORDER — ALLOPURINOL 300 MG PO TABS: 300.0000 mg | ORAL_TABLET | Freq: Every day | ORAL | 0 refills | Status: DC

## 2017-11-24 MED ORDER — FAMOTIDINE 20 MG PO TABS: 20.0000 mg | ORAL_TABLET | Freq: Every day | ORAL | 0 refills | Status: DC

## 2017-11-24 MED ORDER — DILTIAZEM HCL ER COATED BEADS 120 MG PO CP24: 120.0000 mg | ORAL_CAPSULE | Freq: Every day | ORAL | 0 refills | Status: DC

## 2017-11-24 NOTE — Discharge Summary (Signed)
Physician Discharge Summary Note  Patient:  Michael [name] is an 67 y.o., male  MRN:  [id number]  DOB:  Apr 25, 1951  Patient phone:  [phone] (home)   Patient address:   [place] [address],   Total Time spent with patient: Greater than 30 minutes  Date of Admission:  11/18/2017 Date of Discharge: 11-24-17  Reason for Admission: Worsening symptoms of depression triggering suicidal ideations with plan to overdose on Cocaine.   Principal Problem: Cocaine abuse with cocaine-induced mood disorder [facility])  Discharge Diagnoses: Patient Active Problem List   Diagnosis Date Noted  . Severe recurrent major depressive disorder with psychotic features ([place]) [F33.3] 12/12/2014    Priority: High  . Cocaine abuse with cocaine-induced mood disorder [facility]) [F14.14] 08/14/2015    Priority: Medium  . Cocaine use disorder, severe, dependence ([place]) [F14.20] 12/13/2014    Priority: Medium  . Hepatitis C [B19.20] 12/30/2016  . Back pain [M54.9] 04/18/2016  . S/P carotid endarterectomy [Z98.890] 11/15/2015  . MDD (major depressive disorder), recurrent severe, without psychosis ([place]) [F33.2] 09/09/2015  . Cocaine-induced mood disorder ([place]) [F14.94] 08/14/2015  . Gout [M10.9] 07/10/2015  . CKD (chronic kidney disease) stage 3, GFR 30-59 ml/min ([name]) [N18.3] 03/06/2015  . Normocytic anemia [D64.9] 03/06/2015  . Hypoglycemia [E16.2]   . Encounter for general adult medical examination with abnormal findings [Z00.01] 02/09/2015  . Substance or medication-induced depressive disorder with onset during withdrawal [facility]) [R51.884, F19.24, F32.89] 12/13/2014  . Cervicalgia [M54.2] 06/28/2014  . Lumbar radiculopathy, chronic [M54.16] 06/28/2014  . Asthma, chronic [J45.909] 02/03/2014  . 3-vessel CAD [I25.10] 06/24/2013  . ED (erectile dysfunction) of organic origin [N52.9] 07/07/2012  . Hypertension goal BP (blood pressure) < 140/80 [I10] 04/29/2012  . Chondromalacia of left knee  [M94.262] 03/19/2012  . Hyperlipidemia with target LDL less than 100 [E78.5] 02/12/2012  . Fibromyalgia [M79.7] 02/12/2012  . HIV (human immunodeficiency virus infection) ([place]) [B20] 08/27/2011  . Uncontrolled type 2 diabetes with neuropathy ([place]) [E11.40, E11.65] 10/17/2000   Past Psychiatric History: Hx. Cocaine use disorder, Severe depression.  Past Medical History:  Past Medical History:  Diagnosis Date  . A-fib ([place])   . Asthma    No PFTs, history of childhood asthma  . CAD (coronary artery disease)    a. 06/2013 STEMI/PCI ([facility]): LAD w/ thrombus (treated with BMS), mid 75%, D2 75%; LCX OM2 75%; RCA small, PDA 95%, PLV 95%;  b. 10/2013 Cath/PCI: ISR w/in LAD (Promus DES x 2), borderline OM2 lesion;  c. 01/2014 MV: Intermediate risk, medium-sized distal ant wall infarct w/ very small amt of peri-infarct ischemia. EF 60%.  . Cellulitis 04/2014   left facial  . Chondromalacia of medial femoral condyle    Left knee MRI 04/28/12: Chondromalacia of the medial femoral condyle with slight peripheral degeneration of the meniscocapsular junction of the medial meniscus; followed by sports medicine  . Collagen vascular disease ([place])   . Crack cocaine use    for 20+ years, has been enrolled in detox programs in the past  . Depression    with history of hospitalization for suicidal ideation  . Diabetes mellitus 2002   Diagnosed in 2002, started insulin in 2012  . Gout   . Gout 04/28/2012  . Headache(784.0)    CT head 08/2011: Periventricular and subcortical white matter hypodensities are most in keeping with chronic microangiopathic change  . HIV infection [facility]) Nov 2012   Followed by [name]  . Hyperlipidemia   . Hypertension   .  Pulmonary embolism [facility])     Past Surgical History:  Procedure Laterality Date  . BACK SURGERY     1988  . BOWEL RESECTION    . CARDIAC SURGERY    . CERVICAL SPINE SURGERY     " rods in my neck "  . CORONARY STENT PLACEMENT    . NM MYOCAR PERF WALL MOTION   12/27/2011   normal  . SPINE SURGERY     Family History:  Family History  Problem Relation Age of Onset  . Diabetes Mother   . Hypertension Mother   . Hyperlipidemia Mother   . Diabetes Father   . Cancer Father   . Hypertension Father   . Diabetes Brother   . Heart disease Brother   . Diabetes Sister   . Colon cancer Neg Hx    Family Psychiatric  History: See H&P Social History:  Social History   Substance and Sexual Activity  Alcohol Use No  . Alcohol/week: 2.4 oz  . Types: 2 Cans of beer, 2 Shots of liquor per week     Social History   Substance and Sexual Activity  Drug Use Yes  . Types: "Crack" cocaine, Cocaine   Comment: last use January 2018    Social History   Socioeconomic History  . Marital status: Divorced    Spouse name: None  . Number of children: None  . Years of education: 41  . Highest education level: None  Social Needs  . Financial resource strain: None  . Food insecurity - worry: None  . Food insecurity - inability: None  . Transportation needs - medical: None  . Transportation needs - non-medical: None  Occupational History    Employer: UNEMPLOYED    Comment: 04/2016  Tobacco Use  . Smoking status: Never Smoker  . Smokeless tobacco: Never Used  Substance and Sexual Activity  . Alcohol use: No    Alcohol/week: 2.4 oz    Types: 2 Cans of beer, 2 Shots of liquor per week  . Drug use: Yes    Types: "Crack" cocaine, Cocaine    Comment: last use January 2018  . Sexual activity: Yes    Comment: accepted condoms  Other Topics Concern  . None  Social History Narrative   Currently staying with a friend in [place].  Was staying @ local motel until a few days ago - left b/c of bed bugs.   Hospital Course: Michael [name] is a 67 y/o M with history of MDD, cocaine use disorder, and multiple medical problems including recent MI, HIV, DMII, and A Fib who presented voluntarily with worsening symptoms of depression, SI to overdose on crack cocaine, AH,  and worsening substance use of cocaine. Pt also reported being off of his medications for over a month. He agreed to be restarted on sertraline, and he was admitted to [facility] for additional treatment and stabilization. Pt had incremental improvement of his presenting mood symptoms, and he developed discharge plan to go to the [facility].  After his admission assessment, Rangel was started on the medication regimen for his presenting symptoms. And with his consent, he was medicated & discharged on; Gabapentin 400 mg for agitation/neuropathic pains, Hydroxyzine 25 mg prn for anxiety, Sertraline 50 mg for depression & Trazodone 50 mg for insomnia. He was also enrolled & participated in the group counseling sessions being offered & held on this unit. He learned coping skills that should help him after discharge to cope better & manage his symptoms &  substance abuse issues. Keefer presented other significant medical issues that required treatment. He was resumed on all his pertinent home medications for those health issues. He tolerated his treatment regimen without any adverse effects or reactions reported.  Today upon his discharge evaluation with the attending psychiatrist, pt shares, "I'm doing okay today - a little nervous about getting out there." Pt continues to share that he has some anxiety about upcoming health care follow up and cardiac catheterization, but he feels confident that he can follow up with his appointments and avoid illicit substance use by attending meetings and staying connected with staff at [facility]. Pt denies SI/HI/AH/VH. He is sleeping well. His appetite is good. He is tolerating his medications well, and he denies any side effects. He is in agreement to continue his current regimen without changes. He is in agreement for outpatient follow up at [facility] in [place]. He was able to engage in safety planning including plan to return to [facility] or  contact emergency services if he feels unable to maintain his own safety or the safety of others. Pt had no further questions, comments, or concerns.  Upon discharge, Hamlin presents mentally & medically stable. He left [facility] with all personal belongings in no apparent distress. Transportation per bus. [place] assisted with bus pass.  Physical Findings: AIMS: Facial and Oral Movements Muscles of Facial Expression: None, normal Lips and Perioral Area: None, normal Jaw: None, normal Tongue: None, normal,Extremity Movements Upper (arms, wrists, hands, fingers): None, normal Lower (legs, knees, ankles, toes): None, normal, Trunk Movements Neck, shoulders, hips: None, normal, Overall Severity Severity of abnormal movements (highest score from questions above): None, normal Incapacitation due to abnormal movements: None, normal Patient's awareness of abnormal movements (rate only patient's report): No Awareness, Dental Status Current problems with teeth and/or dentures?: No Does patient usually wear dentures?: No  CIWA:    COWS:     Musculoskeletal: Strength & Muscle Tone: within normal limits Gait & Station: normal Patient leans: N/A  Psychiatric Specialty Exam: Physical Exam  Constitutional: He is oriented to person, place, and time. He appears well-developed.  HENT:  Head: Normocephalic.  Eyes: Pupils are equal, round, and reactive to light.  Neck: Normal range of motion.  Cardiovascular:  Hx. Cardiac issues & condition.  Respiratory: Effort normal.  Hx. Pulmonary embolism  GI: Soft.  Genitourinary:  Genitourinary Comments: Deferred  Musculoskeletal: Normal range of motion.  Neurological: He is alert and oriented to person, place, and time.  Skin: Skin is warm.    Review of Systems  Constitutional: Negative.   HENT: Negative.   Eyes: Negative.   Respiratory:       Hx. Pulmonary embolism  Cardiovascular:       Hx. Cardiac condition & issues  Gastrointestinal: Negative.    Genitourinary: Negative.   Musculoskeletal: Negative.   Skin: Negative.   Neurological: Negative.   Endo/Heme/Allergies: Negative.   Psychiatric/Behavioral: Positive for depression (Stable) and substance abuse (Hx. Cocaine use disorder). Negative for hallucinations, memory loss and suicidal ideas. The patient has insomnia (Stable). The patient is not nervous/anxious.     Blood pressure (!) 152/84, pulse 86, temperature 97.9 F (36.6 C), temperature source Oral, resp. rate 18, height 5' 5.35" (1.66 m), weight 81.1 kg (178 lb 12.8 oz).Body mass index is 29.43 kg/m.  See Md's SRA    Have you used any form of tobacco in the last 30 days? (Cigarettes, Smokeless Tobacco, Cigars, and/or Pipes): No  Has this patient used  any form of tobacco in the last 30 days? (Cigarettes, Smokeless Tobacco, Cigars, and/or Pipes): N/A  Blood Alcohol level:  Lab Results  Component Value Date   ETH <10 11/18/2017   ETH <5 60/73/7106   Metabolic Disorder Labs:  Lab Results  Component Value Date   HGBA1C 10.6 12/26/2016   MPG 189 12/15/2014   MPG 280 (H) 05/01/2014   No results found for: PROLACTIN Lab Results  Component Value Date   CHOL 188 10/14/2017   TRIG 113 10/14/2017   HDL 64 10/14/2017   CHOLHDL 2.9 10/14/2017   VLDL 24 01/08/2016   LDLCALC 75 12/26/2016   LDLCALC 128 01/08/2016   See Psychiatric Specialty Exam and Suicide Risk Assessment completed by Attending Physician prior to discharge.  Discharge destination:  Home  Is patient on multiple antipsychotic therapies at discharge:  No   Has Patient had three or more failed trials of antipsychotic monotherapy by history:  No  Recommended Plan for Multiple Antipsychotic Therapies: NA  Allergies as of 11/24/2017   No Known Allergies     Medication List    STOP taking these medications   amantadine 100 MG capsule Commonly known as:  SYMMETREL   blood glucose meter kit and supplies Kit   glucose blood test strip Commonly known  as:  ACCU-CHEK AVIVA PLUS   hydrochlorothiazide 12.5 MG tablet Commonly known as:  HYDRODIURIL Replaced by:  hydrochlorothiazide 12.5 MG capsule   LANTUS SOLOSTAR 100 UNIT/ML Solostar Pen Generic drug:  Insulin Glargine Replaced by:  insulin glargine 100 UNIT/ML injection   Rivaroxaban 15 & 20 MG Tbpk     TAKE these medications     Indication  ACCU-CHEK SOFTCLIX LANCETS lancets USE TO CHECK BLOOD GLUCOSE LEVELS UP TO 4 TIMES DAILY AS DIRECTED  Indication:  Glucose monitring   albuterol 108 (90 Base) MCG/ACT inhaler Commonly known as:  PROAIR HFA INHALE TWO PUFFS INTO THE LUNGS EVERY 6 (SIX) HOURS AS NEEDED FOR WHEEZING OR SHORTNESS OF BREATH.  Indication:  Asthma   allopurinol 300 MG tablet Commonly known as:  ZYLOPRIM Take 1 tablet (300 mg total) by mouth daily. For gout. Start taking on:  11/25/2017 What changed:  additional instructions  Indication:  Gout   diltiazem 120 MG 24 hr capsule Commonly known as:  CARDIZEM CD Take 1 capsule (120 mg total) by mouth daily. For high blood pressure Start taking on:  11/25/2017 What changed:  additional instructions  Indication:  High Blood Pressure Disorder   emtricitabine-rilpivir-tenofovir AF 200-25-25 MG Tabs tablet Commonly known as:  ODEFSEY Take 1 tablet by mouth daily with breakfast. For HIV infection What changed:  additional instructions  Indication:  HIV Disease   famotidine 20 MG tablet Commonly known as:  PEPCID Take 1 tablet (20 mg total) by mouth daily. For acid reflux  Indication:  Gastroesophageal Reflux Disease   gabapentin 400 MG capsule Commonly known as:  NEURONTIN Take 1 capsule (400 mg total) by mouth 2 (two) times daily. For agitation/neuropathic pain What changed:  See the new instructions.  Indication:  Agitation, Neuropathic Pain   hydrochlorothiazide 12.5 MG capsule Commonly known as:  MICROZIDE Take 1 capsule (12.5 mg total) by mouth daily. For high blood pressure Start taking on:   11/25/2017 Replaces:  hydrochlorothiazide 12.5 MG tablet  Indication:  High Blood Pressure Disorder   hydrOXYzine 25 MG tablet Commonly known as:  ATARAX/VISTARIL Take 1 tablet (25 mg total) by mouth 3 (three) times daily as needed for anxiety.  Indication:  Feeling Anxious   insulin glargine 100 UNIT/ML injection Commonly known as:  LANTUS Inject 0.3 mLs (30 Units total) into the skin at bedtime. For diabetes management Replaces:  LANTUS SOLOSTAR 100 UNIT/ML Solostar Pen  Indication:  Type 2 Diabetes   insulin lispro 100 UNIT/ML injection Commonly known as:  HUMALOG Inject 0.05 mLs (5 Units total) into the skin 3 (three) times daily with meals. For diabetes management What changed:    when to take this  additional instructions  Indication:  Type 2 Diabetes   Insulin Syringe-Needle U-100 27G X 5/8" 1 ML Misc Commonly known as:  B-D INS SYR MICROFINE 1CC/27G 1 strip by Does not apply route 3 (three) times daily as needed. For blood sugar checks What changed:  additional instructions  Indication:  Glucose monitoring   isosorbide mononitrate 30 MG 24 hr tablet Commonly known as:  IMDUR Take 1 tablet (30 mg total) by mouth daily. For chest Start taking on:  11/25/2017 What changed:  additional instructions  Indication:  Stable Angina Pectoris   losartan 50 MG tablet Commonly known as:  COZAAR Take 1 tablet (50 mg total) by mouth daily. For high blood pressure Start taking on:  11/25/2017 What changed:  additional instructions  Indication:  High Blood Pressure Disorder   metFORMIN 1000 MG tablet Commonly known as:  GLUCOPHAGE Take 1 tablet (1,000 mg total) by mouth 2 (two) times daily with a meal. For diabetes management What changed:    medication strength  See the new instructions.  Indication:  Type 2 Diabetes   pravastatin 40 MG tablet Commonly known as:  PRAVACHOL Take 1 tablet (40 mg total) by mouth at bedtime. For high cholesterol What changed:  additional  instructions  Indication:  Inherited Heterozygous Hypercholesterolemia, High Amount of Triglycerides in the Blood   sertraline 50 MG tablet Commonly known as:  ZOLOFT Take 1 tablet (50 mg total) by mouth daily. For depression Start taking on:  11/25/2017 What changed:  additional instructions  Indication:  Major Depressive Disorder   traZODone 50 MG tablet Commonly known as:  DESYREL Take 1 tablet (50 mg total) by mouth at bedtime as needed for sleep.  Indication:  [facility] Follow up.   Why:  Please walk in within 3 days of hospital discharge to be assessed for outpatient mental health services including medication management. Walk in at 8:30AM Mon-Fri to be assessed. Thank you.  Contact information: [address] [place], [place] [address] Phone: [phone] Fax: [phone]         Follow-up recommendations: Activity:  As tolerated Diet: As recommended by your primary care doctor. Keep all scheduled follow-up appointments as recommended.   Comments: Patient is instructed prior to discharge to: Take all medications as prescribed by his/her mental healthcare provider. Report any adverse effects and or reactions from the medicines to his/her outpatient provider promptly. Patient has been instructed & cautioned: To not engage in alcohol and or illegal drug use while on prescription medicines. In the event of worsening symptoms, patient is instructed to call the crisis hotline, 911 and or go to the nearest ED for appropriate evaluation and treatment of symptoms. To follow-up with his/her primary care provider for your other medical issues, concerns and or health care needs.   Signed: Lindell Spar, NP, PMHNP, FNP-BC 11/24/2017, 9:57 AM   Patient seen, Suicide Assessment Completed.  Disposition Plan Reviewed

## 2017-11-24 NOTE — Progress Notes (Signed)
Pt discharged to [facility] on a bus pass. Pt was ambulatory, stable and appreciative at that time. All papers and prescriptions were given and valuables returned. Verbal understanding expressed. Denies SI/HI and A/VH. Pt given opportunity to express concerns and ask questions.

## 2017-11-24 NOTE — Progress Notes (Signed)
  [facility] Adult Case Management Discharge Plan :  Will you be returning to the same living situation after discharge:  No. Patient is going to the [facility] at discharge.  At discharge, do you have transportation home?: Yes,  bus/PART bus Do you have the ability to pay for your medications: Yes,  [employer]  Release of information consent forms completed and in the chart;  Patient's signature needed at discharge.  Patient to Follow up at: Follow-up [facility] Follow up.   Why:  Please walk in within 3 days of hospital discharge to be assessed for outpatient mental health services including medication management. Walk in at 8:30AM Mon-Fri to be assessed. Thank you.  Contact information: [address] [place], [place] [address] Phone: [phone] Fax: [phone]          Next level of care provider has access to [place] and Suicide Prevention discussed: Yes,  with the patient  Have you used any form of tobacco in the last 30 days? (Cigarettes, Smokeless Tobacco, Cigars, and/or Pipes): No  Has patient been referred to the Quitline?: N/A patient is not a smoker  Patient has been referred for addiction treatment: Yes  Marylee Floras, [place] 11/24/2017, 9:15 AM

## 2017-11-24 NOTE — Progress Notes (Signed)
Recreation Therapy Notes  Date: 11/23/17 Time: 0930 Location: [address] Dayroom  Group Topic: Stress Management  Goal Area(s) Addresses:  Patient will verbalize importance of using healthy stress management.  Patient will identify positive emotions associated with healthy stress management.   Behavioral Response: Engaged  Intervention: Stress Management  Activity :  [employer].  LRT played a meditation on the power of mountains to be resilient in the face of change.  Patients were to follow along with the meditation as it played.  Education:  Stress Management, Discharge Planning.   Education Outcome: Acknowledges edcuation/In group clarification offered/Needs additional education  Clinical Observations/Feedback: Pt attended group.     Victorino Sparrow, LRT/CTRS         Ria Comment, Armenta Erskin A 11/24/2017 11:09 AM

## 2017-11-24 NOTE — BHH Suicide Risk Assessment (Signed)
[facility] Discharge Suicide Risk Assessment   Principal Problem: Cocaine abuse with cocaine-induced mood disorder [facility]) Discharge Diagnoses:  Patient Active Problem List   Diagnosis Date Noted  . Hepatitis C [B19.20] 12/30/2016  . Back pain [M54.9] 04/18/2016  . S/P carotid endarterectomy [Z98.890] 11/15/2015  . MDD (major depressive disorder), recurrent severe, without psychosis ([place]) [F33.2] 09/09/2015  . Cocaine-induced mood disorder ([place]) [F14.94] 08/14/2015  . Cocaine abuse with cocaine-induced mood disorder ([place]) [F14.14] 08/14/2015  . Gout [M10.9] 07/10/2015  . CKD (chronic kidney disease) stage 3, GFR 30-59 ml/min ([name]) [N18.3] 03/06/2015  . Normocytic anemia [D64.9] 03/06/2015  . Hypoglycemia [E16.2]   . Encounter for general adult medical examination with abnormal findings [Z00.01] 02/09/2015  . Cocaine use disorder, severe, dependence ([place]) [F14.20] 12/13/2014  . Substance or medication-induced depressive disorder with onset during withdrawal [facility]) [S96.283, F19.24, F32.89] 12/13/2014  . Severe recurrent major depressive disorder with psychotic features ([place]) [F33.3] 12/12/2014  . Cervicalgia [M54.2] 06/28/2014  . Lumbar radiculopathy, chronic [M54.16] 06/28/2014  . Asthma, chronic [J45.909] 02/03/2014  . 3-vessel CAD [I25.10] 06/24/2013  . ED (erectile dysfunction) of organic origin [N52.9] 07/07/2012  . Hypertension goal BP (blood pressure) < 140/80 [I10] 04/29/2012  . Chondromalacia of left knee [M94.262] 03/19/2012  . Hyperlipidemia with target LDL less than 100 [E78.5] 02/12/2012  . Fibromyalgia [M79.7] 02/12/2012  . HIV (human immunodeficiency virus infection) ([place]) [B20] 08/27/2011  . Uncontrolled type 2 diabetes with neuropathy ([place]) [E11.40, E11.65] 10/17/2000    Total Time spent with patient: 30 minutes  Musculoskeletal: Strength & Muscle Tone: within normal limits Gait & Station: normal Patient leans: N/A  Psychiatric Specialty Exam: Review of Systems   Constitutional: Negative for chills and fever.  Respiratory: Negative for cough and shortness of breath.   Cardiovascular: Negative for chest pain.  Gastrointestinal: Negative for abdominal pain, heartburn, nausea and vomiting.  Psychiatric/Behavioral: Negative for depression, hallucinations and suicidal ideas. The patient is not nervous/anxious.     Blood pressure (!) 152/84, pulse 86, temperature 97.9 F (36.6 C), temperature source Oral, resp. rate 18, height 5' 5.35" (1.66 m), weight 81.1 kg (178 lb 12.8 oz).Body mass index is 29.43 kg/m.  General Appearance: Casual and Fairly Groomed  Engineer, water::  Good  Speech:  Clear and Coherent and Normal Rate  Volume:  Normal  Mood:  Euthymic  Affect:  Appropriate and Congruent  Thought Process:  Coherent and Goal Directed  Orientation:  Full (Time, Place, and Person)  Thought Content:  Logical  Suicidal Thoughts:  No  Homicidal Thoughts:  No  Memory:  Immediate;   Fair Recent;   Fair Remote;   Fair  Judgement:  Fair  Insight:  Fair  Psychomotor Activity:  Normal  Concentration:  Fair  Recall:  [employer] of Knowledge:Fair  Language: Fair  Akathisia:  No  Handed:    AIMS (if indicated):     Assets:  Communication Skills Physical Health Resilience Talents/Skills  Sleep:  Number of Hours: 6.5  Cognition: WNL  ADL's:  Intact   Mental Status Per Nursing Assessment::   On Admission:  Suicidal ideation indicated by patient  Demographic Factors:  Male, Age 11 or older, Low socioeconomic status, Living alone and Unemployed  Loss Factors: Decline in physical health and Financial problems/change in socioeconomic status  Historical Factors: Family history of mental illness or substance abuse and Impulsivity  Risk Reduction Factors:   Positive social support, Positive therapeutic relationship and Positive coping skills or problem solving skills  Continued Clinical  Symptoms:  Depression:   Impulsivity Alcohol/Substance  Abuse/Dependencies More than one psychiatric diagnosis Unstable or Poor Therapeutic Relationship Previous Psychiatric Diagnoses and Treatments Medical Diagnoses and Treatments/Surgeries  Cognitive Features That Contribute To Risk:  None    Suicide Risk:  Minimal: No identifiable suicidal ideation.  Patients presenting with no risk factors but with morbid ruminations; may be classified as minimal risk based on the severity of the depressive symptoms  Follow-up [facility] Follow up.   Why:  Please walk in within 3 days of hospital discharge to be assessed for outpatient mental health services including medication management. Walk in at 8:30AM Mon-Fri to be assessed. Thank you.  Contact information: [address], [place] [address] Phone: [phone] Fax: [phone]        Subjective Data:  Michael [name] is a 67 y/o M with history of MDD, cocaine use disorder, and multiple medical problems including recent MI, HIV, DMII, and A Fib who presented voluntarily with worsening symptoms of depression, SI to overdose on crack cocaine, AH, and worsening substance use of cocaine. Pt also reported being off of his medications for over a month. He agreed to be restarted on sertraline, and he was admitted to [facility] for additional treatment and stabilization. Pt had incremental improvement of his presenting mood symptoms, and he developed discharge plan to go to the [facility].  Today upon evaluation, pt shares, "I'm doing okay today - a little nervous about getting out there." Pt continues to share that he has some anxiety about upcoming health care follow up and cardiac catheterization, but he feels confident that he can follow up with his appointments and avoid illicit substance use by attending meetings and staying connected with staff at [facility]. Pt denies SI/HI/AH/VH. He is sleeping well. His appetite is good. He is tolerating his  medications well, and he denies any side effects. He is in agreement to continue his current regimen without changes. He is in agreement for outpatient follow up at [facility] in [place]. He was able to engage in safety planning including plan to return to [facility] or contact emergency services if he feels unable to maintain his own safety or the safety of others. Pt had no further questions, comments, or concerns.    Plan Of Care/Follow-up recommendations:   - Discharge to outpatient level of care  -MDD -Continue zoloft 50 mg po qDay  - Anxiety -Continue Atarax 25 mg po TID prn anxiety - Continue Gabapentin 400 mg po BID  -Insomnia - Continue trazodone 50 mg po qhs prn insomnia  -HLD - Continue pravastatin  -DMII - Continue metformin, SSI, HCTZ  -HTN - Continue losartan, Imdur  Activity:  as tolerated Diet:  normal Tests:  NA Other:  see above for DC plan  Pennelope Bracken, MD 11/24/2017, 10:23 AM

## 2017-12-02 ENCOUNTER — Emergency Department (HOSPITAL_COMMUNITY)
Admission: EM | Admit: 2017-12-02 | Discharge: 2017-12-03 | Disposition: A | Discharge status: Home / Self Care | Payer: Medicare Other | Attending: Emergency Medicine | Admitting: Emergency Medicine

## 2017-12-02 ENCOUNTER — Encounter (HOSPITAL_COMMUNITY): Payer: Self-pay | Admitting: Emergency Medicine

## 2017-12-02 DIAGNOSIS — I4891 Unspecified atrial fibrillation: Secondary | ICD-10-CM | POA: Diagnosis not present

## 2017-12-02 DIAGNOSIS — R0789 Other chest pain: Secondary | ICD-10-CM | POA: Diagnosis not present

## 2017-12-02 DIAGNOSIS — F332 Major depressive disorder, recurrent severe without psychotic features: Secondary | ICD-10-CM | POA: Diagnosis not present

## 2017-12-02 DIAGNOSIS — R45851 Suicidal ideations: Secondary | ICD-10-CM | POA: Diagnosis not present

## 2017-12-02 DIAGNOSIS — I2510 Atherosclerotic heart disease of native coronary artery without angina pectoris: Secondary | ICD-10-CM | POA: Insufficient documentation

## 2017-12-02 DIAGNOSIS — Z79899 Other long term (current) drug therapy: Secondary | ICD-10-CM | POA: Diagnosis not present

## 2017-12-02 DIAGNOSIS — N183 Chronic kidney disease, stage 3 (moderate): Secondary | ICD-10-CM | POA: Diagnosis not present

## 2017-12-02 DIAGNOSIS — Z794 Long term (current) use of insulin: Secondary | ICD-10-CM | POA: Insufficient documentation

## 2017-12-02 DIAGNOSIS — B20 Human immunodeficiency virus [HIV] disease: Secondary | ICD-10-CM | POA: Diagnosis not present

## 2017-12-02 DIAGNOSIS — E1122 Type 2 diabetes mellitus with diabetic chronic kidney disease: Secondary | ICD-10-CM | POA: Insufficient documentation

## 2017-12-02 DIAGNOSIS — F1410 Cocaine abuse, uncomplicated: Secondary | ICD-10-CM | POA: Insufficient documentation

## 2017-12-02 DIAGNOSIS — Z86711 Personal history of pulmonary embolism: Secondary | ICD-10-CM | POA: Diagnosis not present

## 2017-12-02 LAB — CBG MONITORING, ED
GLUCOSE-CAPILLARY: 290 mg/dL — AB (ref 65–99)
Glucose-Capillary: 459 mg/dL — ABNORMAL HIGH (ref 65–99)
Glucose-Capillary: 374 mg/dL — ABNORMAL HIGH (ref 65–99)

## 2017-12-02 LAB — CBC WITH DIFFERENTIAL/PLATELET
Basophils Absolute: 0 10*3/uL (ref 0.0–0.1)
Basophils Relative: 1 %
Eosinophils Absolute: 0.1 10*3/uL (ref 0.0–0.7)
Eosinophils Relative: 2 %
HEMATOCRIT: 33.9 % — AB (ref 39.0–52.0)
Hemoglobin: 11.1 g/dL — ABNORMAL LOW (ref 13.0–17.0)
LYMPHS ABS: 1 10*3/uL (ref 0.7–4.0)
Lymphocytes Relative: 25 %
MCH: 27.5 pg (ref 26.0–34.0)
MCHC: 32.7 g/dL (ref 30.0–36.0)
MCV: 83.9 fL (ref 78.0–100.0)
MONOS PCT: 6 %
Monocytes Absolute: 0.2 10*3/uL (ref 0.1–1.0)
NEUTROS ABS: 2.7 10*3/uL (ref 1.7–7.7)
NEUTROS PCT: 68 %
Platelets: 194 10*3/uL (ref 150–400)
RBC: 4.04 MIL/uL — AB (ref 4.22–5.81)
RDW: 13.5 % (ref 11.5–15.5)
WBC: 4 10*3/uL (ref 4.0–10.5)

## 2017-12-02 LAB — ACETAMINOPHEN LEVEL

## 2017-12-02 LAB — COMPREHENSIVE METABOLIC PANEL
ALBUMIN: 4.2 g/dL (ref 3.5–5.0)
ALK PHOS: 68 U/L (ref 38–126)
ALT: 19 U/L (ref 17–63)
ANION GAP: 14 (ref 5–15)
AST: 27 U/L (ref 15–41)
BILIRUBIN TOTAL: 0.8 mg/dL (ref 0.3–1.2)
BUN: 27 mg/dL — ABNORMAL HIGH (ref 6–20)
CALCIUM: 9.2 mg/dL (ref 8.9–10.3)
CO2: 22 mmol/L (ref 22–32)
CREATININE: 1.76 mg/dL — AB (ref 0.61–1.24)
Chloride: 102 mmol/L (ref 101–111)
GFR calc non Af Amer: 38 mL/min — ABNORMAL LOW (ref >60)
GFR, EST AFRICAN AMERICAN: 44 mL/min — AB (ref >60)
GLUCOSE: 481 mg/dL — AB (ref 65–99)
Potassium: 4 mmol/L (ref 3.5–5.1)
Sodium: 138 mmol/L (ref 135–145)
TOTAL PROTEIN: 8 g/dL (ref 6.5–8.1)

## 2017-12-02 LAB — RAPID URINE DRUG SCREEN, HOSP PERFORMED
AMPHETAMINES: NOT DETECTED
BENZODIAZEPINES: NOT DETECTED
Barbiturates: NOT DETECTED
COCAINE: POSITIVE — AB
OPIATES: NOT DETECTED
TETRAHYDROCANNABINOL: NOT DETECTED

## 2017-12-02 LAB — ETHANOL: Alcohol, Ethyl (B): <10 mg/dL (ref <10)

## 2017-12-02 LAB — SALICYLATE LEVEL: Salicylate Lvl: <7 mg/dL (ref 2.8–30.0)

## 2017-12-02 LAB — TROPONIN I
Troponin I: <0.03 ng/mL (ref <0.03)
Troponin I: <0.03 ng/mL (ref <0.03)

## 2017-12-02 MED ORDER — INSULIN ASPART 100 UNIT/ML ~~LOC~~ SOLN
0.0000 [IU] | Freq: Three times a day (TID) | SUBCUTANEOUS | Status: DC
  Administered 2017-12-02: 9 [IU] via SUBCUTANEOUS
  Administered 2017-12-03: 5 [IU] via SUBCUTANEOUS
  Administered 2017-12-03: 7 [IU] via SUBCUTANEOUS
  Filled 2017-12-02 (×3): qty 1

## 2017-12-02 NOTE — ED Triage Notes (Signed)
Patient here from home with complaints of SI with plan. Reports smoking crack. Left side chest pain.

## 2017-12-02 NOTE — BH Assessment (Signed)
Tele Assessment Note   Patient Name: Michael [name] MRN: [id number] Referring Physician: Quintella Reichert, MD Location of [facility]: [facility] Location of Provider: [place] is an 67 y.o. male presents to [facility] with complaints of suicidal ideations with no defined plan. Patient report he abuse crack cocaine and his suicidal ideations are related to his substance use. Patient report, "I cannot stop smoking crack cocaine," Patient last used crack last night. Report he uses crack due to his current housing situation with a roommate that also uses. Patient denies homicidal ideations, visula and auditory hallucinations.   Per Jinny Blossom, Patient is recommended for discharge  Diagnosis: F33.2   Major depressive disorder, Recurrent episode, Severe    Past Medical History:  Past Medical History:  Diagnosis Date  . A-fib ([place])   . Asthma    No PFTs, history of childhood asthma  . CAD (coronary artery disease)    a. 06/2013 STEMI/PCI ([facility]): LAD w/ thrombus (treated with BMS), mid 75%, D2 75%; LCX OM2 75%; RCA small, PDA 95%, PLV 95%;  b. 10/2013 Cath/PCI: ISR w/in LAD (Promus DES x 2), borderline OM2 lesion;  c. 01/2014 MV: Intermediate risk, medium-sized distal ant wall infarct w/ very small amt of peri-infarct ischemia. EF 60%.  . Cellulitis 04/2014   left facial  . Chondromalacia of medial femoral condyle    Left knee MRI 04/28/12: Chondromalacia of the medial femoral condyle with slight peripheral degeneration of the meniscocapsular junction of the medial meniscus; followed by sports medicine  . Collagen vascular disease ([place])   . Crack cocaine use    for 20+ years, has been enrolled in detox programs in the past  . Depression    with history of hospitalization for suicidal ideation  . Diabetes mellitus 2002   Diagnosed in 2002, started insulin in 2012  . Gout   . Gout 04/28/2012  . Headache(784.0)    CT head 08/2011: Periventricular and subcortical  white matter hypodensities are most in keeping with chronic microangiopathic change  . HIV infection [facility]) Nov 2012   Followed by [name]  . Hyperlipidemia   . Hypertension   . Pulmonary embolism [facility])     Past Surgical History:  Procedure Laterality Date  . BACK SURGERY     1988  . BOWEL RESECTION    . CARDIAC SURGERY    . CERVICAL SPINE SURGERY     " rods in my neck "  . CORONARY STENT PLACEMENT    . NM MYOCAR PERF WALL MOTION  12/27/2011   normal  . SPINE SURGERY      Family History:  Family History  Problem Relation Age of Onset  . Diabetes Mother   . Hypertension Mother   . Hyperlipidemia Mother   . Diabetes Father   . Cancer Father   . Hypertension Father   . Diabetes Brother   . Heart disease Brother   . Diabetes Sister   . Colon cancer Neg Hx     Social History:  reports that  has never smoked. he has never used smokeless tobacco. He reports that he uses drugs. Drugs: "Crack" cocaine and Cocaine. He reports that he does not drink alcohol.  Additional Social History:  Alcohol / Drug Use Pain Medications: see MAR Prescriptions: see  MAR Over the Counter: see MAR History of alcohol / drug use?: Yes Longest period of sobriety (when/how long): 3 yrs Negative Consequences of Use: Financial, Personal relationships Substance #1 Name of  Substance 1: Cocaine (crack) 1 - Age of First Use: 40's 1 - Amount (size/oz): unknown 1 - Frequency: Daily 1 - Duration: daily 1 - Last Use / Amount: 12/01/2017  CIWA: CIWA-Ar BP: (!) 159/79 Pulse Rate: 70 COWS:    Allergies: No Known Allergies  Home Medications:  (Not in a hospital admission)  OB/GYN Status:  No LMP for male patient.  General Assessment Data [facility]: [facility] TTS Assessment: In system Is this a Tele or Face-to-Face Assessment?: Face-to-Face Is this an Initial Assessment or a Re-assessment for this encounter?: Initial Assessment Marital status: Divorced Liberal name: n/a Is patient  pregnant?: No Pregnancy Status: No Living Arrangements: Other (Comment)(homeless) Can pt return to current living arrangement?: Yes Admission Status: Voluntary Is patient capable of signing voluntary admission?: Yes Referral Source: Self/Family/Friend Insurance type: [employer]     Crisis Care Plan Living Arrangements: Other (Comment)(homeless) Name of Psychiatrist: None Name of Therapist: None  Education Status Is patient currently in school?: No Highest grade of school patient has completed: 12th grade  Risk to self with the past 6 months Suicidal Ideation: Yes-Currently Present(SI, no specific plan) Has patient been a risk to self within the past 6 months prior to admission? : Yes(report feeling of SI related to crack cocaine usage) Suicidal Intent: No Has patient had any suicidal intent within the past 6 months prior to admission? : No(Per pt. he last attempted to hurt himself 2 yrs ago) Is patient at risk for suicide?: Yes(SI thoughts) Suicidal Plan?: No-Not Currently/Within Last 6 Months Has patient had any suicidal plan within the past 6 months prior to admission? : Yes Specify Current Suicidal Plan: No specific plan  Access to Means: No Specify Access to Suicidal Means: n/a What has been your use of drugs/alcohol within the last 12 months?: crack cocaine Previous Attempts/Gestures: Yes How many times?: 1(report multiple SI thoughts, intent 2 years ago) Other Self Harm Risks: none report Triggers for Past Attempts: Other (Comment)(substance use / cocaine usage) Intentional Self Injurious Behavior: None Family Suicide History: No Recent stressful life event(s): Other (Comment)(homeless) Persecutory voices/beliefs?: No Depression: Yes Depression Symptoms: Feeling worthless/self pity, Loss of interest in usual pleasures, Guilt Substance abuse history and/or treatment for substance abuse?: Yes Suicide prevention information given to non-admitted patients:  Yes  Risk to Others within the past 6 months Homicidal Ideation: No Does patient have any lifetime risk of violence toward others beyond the six months prior to admission? : No Thoughts of Harm to Others: No Current Homicidal Intent: No Current Homicidal Plan: No Access to Homicidal Means: No Identified Victim: n/a History of harm to others?: No Assessment of Violence: None Noted Violent Behavior Description: n/a Does patient have access to weapons?: No Criminal Charges Pending?: No Does patient have a court date: No Is patient on probation?: No  Psychosis Hallucinations: None noted Delusions: None noted  Mental Status Report Appearance/Hygiene: In scrubs Eye Contact: Fair Motor Activity: Freedom of movement Speech: Logical/coherent Level of Consciousness: Alert Mood: Pleasant Affect: Appropriate to circumstance Anxiety Level: None Thought Processes: Coherent Judgement: Partial(crack cocaine usage linked to suicidal thoughts ) Orientation: Person, Place, Situation, Time Obsessive Compulsive Thoughts/Behaviors: None  Cognitive Functioning Concentration: Normal Memory: Recent Intact, Remote Intact IQ: Average Insight: Fair Impulse Control: Fair Appetite: Fair Sleep: Increased Vegetative Symptoms: Staying in bed  ADLScreening [facility] Assessment Services) Patient's cognitive ability adequate to safely complete daily activities?: Yes Patient able to express need for assistance with ADLs?: Yes Independently performs ADLs?: Yes (  appropriate for developmental age)  Prior Inpatient Therapy Prior Inpatient Therapy: Yes Prior Therapy Dates: Nov. 2019 Prior Therapy Facilty/Provider(s): [facility] Reason for Treatment: detox / rehab  Prior Outpatient Therapy Prior Outpatient Therapy: No Prior Therapy Dates: None Prior Therapy Facilty/Provider(s): N/A Reason for Treatment: N/A Does patient have an ACCT team?: No Does patient have Intensive In-House Services?   : No Does patient have [facility] services? : No Does patient have P4CC services?: No  ADL Screening (condition at time of admission) Patient's cognitive ability adequate to safely complete daily activities?: Yes Is the patient deaf or have difficulty hearing?: No Does the patient have difficulty seeing, even when wearing glasses/contacts?: No Does the patient have difficulty concentrating, remembering, or making decisions?: Yes Patient able to express need for assistance with ADLs?: Yes Does the patient have difficulty dressing or bathing?: No Independently performs ADLs?: Yes (appropriate for developmental age) Does the patient have difficulty walking or climbing stairs?: No       Abuse/Neglect Assessment (Assessment to be complete while patient is alone) Abuse/Neglect Assessment Can Be Completed: Yes Physical Abuse: Denies Verbal Abuse: Denies Sexual Abuse: Denies Exploitation of patient/patient's resources: Denies Self-Neglect: Denies     Regulatory affairs officer (For Healthcare) Does Patient Have a Medical Advance Directive?: No Would patient like information on creating a medical advance directive?: No - Patient declined    Additional Information 1:1 In Past 12 Months?: No CIRT Risk: No Elopement Risk: No Does patient have medical clearance?: No     Disposition:  Disposition Initial Assessment Completed for this Encounter: Yes Disposition of Patient: Discharge with Outpatient Resources  Trey Gulbranson [facility] 12/02/2017 3:11 PM

## 2017-12-02 NOTE — BH Assessment (Signed)
[facility] Assessment Progress Note  Per Ethelene Hal, FNP, this pt does not require psychiatric hospitalization at this time.  Pt is psychiatrically cleared.  He is to be provided with referral information for area substance abuse treatment providers, which has been included in his discharge instructions.  Pt's nurse has been notified.  Jalene Mullet, [place] Coordinator [phone]

## 2017-12-02 NOTE — Discharge Instructions (Signed)
To help you maintain a sober lifestyle, a substance abuse treatment program may be beneficial to you.  Contact one of the following facilities at your earliest opportunity to ask about enrolling:  RESIDENTIAL PROGRAMS:       [place], [place] [address]      [phone]       [facility]      [address]      [place], [place] [address]      [phone]  OUTPATIENT PROGRAMS:       [facility]      [place], [place] [address]      [phone]      New patients are seen at the walk-in clinic every Tuesday from 9:00 am - 12:00 pm       [facility]      [address] [place], [place] [address]      [phone]

## 2017-12-02 NOTE — ED Provider Notes (Signed)
[facility] DEPT Provider Note   CSN: [id number] Arrival date & time: 12/02/17  1047     History   Chief Complaint Chief Complaint  Patient presents with  . Suicidal  . Chest Pain    HPI Michael [name] is a 67 y.o. male.  The history is provided by the patient. No language interpreter was used.  Chest Pain      Michael [name] is a 67 y.o. male who presents to the Emergency Department complaining of SI.  Since to the ED for evaluation of suicidal thoughts.  He endorses 1 week of wanting to harm himself.  He describes no particular plan.  He also reports a 1 month of left-sided chest pain that is waxing and waning in nature.  He endorses increasing suicidal thoughts related to crack cocaine use.  He last used crack last night.  He is using crack due to his current housing situation with a roommate that also uses.  Past Medical History:  Diagnosis Date  . A-fib ([place])   . Asthma    No PFTs, history of childhood asthma  . CAD (coronary artery disease)    a. 06/2013 STEMI/PCI ([facility]): LAD w/ thrombus (treated with BMS), mid 75%, D2 75%; LCX OM2 75%; RCA small, PDA 95%, PLV 95%;  b. 10/2013 Cath/PCI: ISR w/in LAD (Promus DES x 2), borderline OM2 lesion;  c. 01/2014 MV: Intermediate risk, medium-sized distal ant wall infarct w/ very small amt of peri-infarct ischemia. EF 60%.  . Cellulitis 04/2014   left facial  . Chondromalacia of medial femoral condyle    Left knee MRI 04/28/12: Chondromalacia of the medial femoral condyle with slight peripheral degeneration of the meniscocapsular junction of the medial meniscus; followed by sports medicine  . Collagen vascular disease ([place])   . Crack cocaine use    for 20+ years, has been enrolled in detox programs in the past  . Depression    with history of hospitalization for suicidal ideation  . Diabetes mellitus 2002   Diagnosed in 2002, started insulin in 2012  . Gout   . Gout 04/28/2012  . Headache(784.0)    CT head 08/2011: Periventricular and subcortical white matter hypodensities are most in keeping with chronic microangiopathic change  . HIV infection [facility]) Nov 2012   Followed by [name]  . Hyperlipidemia   . Hypertension   . Pulmonary embolism [facility])     Patient Active Problem List   Diagnosis Date Noted  . Hepatitis C 12/30/2016  . Back pain 04/18/2016  . S/P carotid endarterectomy 11/15/2015  . MDD (major depressive disorder), recurrent severe, without psychosis ([place]) 09/09/2015  . Cocaine-induced mood disorder ([place]) 08/14/2015  . Cocaine abuse with cocaine-induced mood disorder ([place]) 08/14/2015  . Gout 07/10/2015  . CKD (chronic kidney disease) stage 3, GFR 30-59 ml/min ([name]) 03/06/2015  . Normocytic anemia 03/06/2015  . Hypoglycemia   . Encounter for general adult medical examination with abnormal findings 02/09/2015  . Cocaine use disorder, severe, dependence ([place]) 12/13/2014  . Substance or medication-induced depressive disorder with onset during withdrawal ([place]) 12/13/2014  . Severe recurrent major depressive disorder with psychotic features ([place]) 12/12/2014  . Cervicalgia 06/28/2014  . Lumbar radiculopathy, chronic 06/28/2014  . Asthma, chronic 02/03/2014  . 3-vessel CAD 06/24/2013  . ED (erectile dysfunction) of organic origin 07/07/2012  . Hypertension goal BP (blood pressure) < 140/80 04/29/2012  . Chondromalacia of left knee 03/19/2012  . Hyperlipidemia with target LDL less than 100 02/12/2012  .  Fibromyalgia 02/12/2012  . HIV (human immunodeficiency virus infection) ([place]) 08/27/2011  . Uncontrolled type 2 diabetes with neuropathy ([place]) 10/17/2000    Past Surgical History:  Procedure Laterality Date  . BACK SURGERY     1988  . BOWEL RESECTION    . CARDIAC SURGERY    . CERVICAL SPINE SURGERY     " rods in my neck "  . CORONARY STENT PLACEMENT    . NM MYOCAR PERF WALL MOTION  12/27/2011   normal  . SPINE SURGERY         Home Medications    Prior to  Admission medications   Medication Sig Start Date End Date Taking? Authorizing Provider  ACCU-CHEK SOFTCLIX LANCETS lancets USE TO CHECK BLOOD GLUCOSE LEVELS UP TO 4 TIMES DAILY AS DIRECTED 11/24/17  Yes Nwoko, Herbert Pun I, NP  albuterol (PROAIR HFA) 108 (90 Base) MCG/ACT inhaler INHALE TWO PUFFS INTO THE LUNGS EVERY 6 (SIX) HOURS AS NEEDED FOR WHEEZING OR SHORTNESS OF BREATH. 11/24/17  Yes Lindell Spar I, NP  allopurinol (ZYLOPRIM) 300 MG tablet Take 1 tablet (300 mg total) by mouth daily. For gout. 11/25/17  Yes Lindell Spar I, NP  aspirin 81 MG chewable tablet Chew 81 mg by mouth daily.   Yes [provider]  diltiazem (CARDIZEM CD) 120 MG 24 hr capsule Take 1 capsule (120 mg total) by mouth daily. For high blood pressure 11/25/17  Yes Lindell Spar I, NP  emtricitabine-rilpivir-tenofovir AF (ODEFSEY) 200-25-25 MG TABS tablet Take 1 tablet by mouth daily with breakfast. For HIV infection 11/24/17  Yes Nwoko, Herbert Pun I, NP  famotidine (PEPCID) 20 MG tablet Take 1 tablet (20 mg total) by mouth daily. For acid reflux 11/24/17  Yes Lindell Spar I, NP  gabapentin (NEURONTIN) 400 MG capsule Take 1 capsule (400 mg total) by mouth 2 (two) times daily. For agitation/neuropathic pain Patient taking differently: Take 400 mg by mouth 2 (two) times daily as needed (For agitation/neuropathic pain).  11/24/17  Yes Lindell Spar I, NP  hydrochlorothiazide (MICROZIDE) 12.5 MG capsule Take 1 capsule (12.5 mg total) by mouth daily. For high blood pressure 11/25/17  Yes Nwoko, Herbert Pun I, NP  hydrOXYzine (ATARAX/VISTARIL) 25 MG tablet Take 1 tablet (25 mg total) by mouth 3 (three) times daily as needed for anxiety. 11/24/17  Yes Lindell Spar I, NP  insulin glargine (LANTUS) 100 UNIT/ML injection Inject 0.3 mLs (30 Units total) into the skin at bedtime. For diabetes management 11/24/17  Yes Lindell Spar I, NP  insulin lispro (HUMALOG) 100 UNIT/ML injection Inject 0.05 mLs (5 Units total) into the skin 3 (three) times daily with  meals. For diabetes management 11/24/17  Yes Lindell Spar I, NP  Insulin Syringe-Needle U-100 (B-D INS SYR MICROFINE 1CC/27G) 27G X 5/8" 1 ML MISC 1 strip by Does not apply route 3 (three) times daily as needed. For blood sugar checks 11/24/17  Yes Nwoko, Herbert Pun I, NP  isosorbide mononitrate (IMDUR) 30 MG 24 hr tablet Take 1 tablet (30 mg total) by mouth daily. For chest 11/25/17  Yes Lindell Spar I, NP  losartan (COZAAR) 50 MG tablet Take 1 tablet (50 mg total) by mouth daily. For high blood pressure 11/25/17  Yes Nwoko, Herbert Pun I, NP  metFORMIN (GLUCOPHAGE) 1000 MG tablet Take 1 tablet (1,000 mg total) by mouth 2 (two) times daily with a meal. For diabetes management 11/24/17  Yes Lindell Spar I, NP  pravastatin (PRAVACHOL) 40 MG tablet Take 1 tablet (40 mg total) by mouth at  bedtime. For high cholesterol 11/24/17  Yes Lindell Spar I, NP  sertraline (ZOLOFT) 50 MG tablet Take 1 tablet (50 mg total) by mouth daily. For depression 11/25/17  Yes Lindell Spar I, NP  traZODone (DESYREL) 50 MG tablet Take 1 tablet (50 mg total) by mouth at bedtime as needed for sleep. 11/24/17  Yes Encarnacion Slates, NP    Family History Family History  Problem Relation Age of Onset  . Diabetes Mother   . Hypertension Mother   . Hyperlipidemia Mother   . Diabetes Father   . Cancer Father   . Hypertension Father   . Diabetes Brother   . Heart disease Brother   . Diabetes Sister   . Colon cancer Neg Hx     Social History Social History   Tobacco Use  . Smoking status: Never Smoker  . Smokeless tobacco: Never Used  Substance Use Topics  . Alcohol use: No    Alcohol/week: 2.4 oz    Types: 2 Cans of beer, 2 Shots of liquor per week  . Drug use: Yes    Types: "Crack" cocaine, Cocaine    Comment: last use January 2018     Allergies   Patient has no known allergies.   Review of Systems Review of Systems  Cardiovascular: Positive for chest pain.  All other systems reviewed and are negative.    Physical  Exam Updated Vital Signs BP (!) 159/79 (BP Location: Right Arm)   Pulse 70   Temp 98 F (36.7 C) (Oral)   Resp 18   SpO2 100%   Physical Exam  Constitutional: He is oriented to person, place, and time. He appears well-developed and well-nourished.  HENT:  Head: Normocephalic and atraumatic.  Cardiovascular: Normal rate and regular rhythm.  No murmur heard. Pulmonary/Chest: Effort normal and breath sounds normal. No respiratory distress.  Abdominal: Soft. There is no tenderness. There is no rebound and no guarding.  Musculoskeletal: He exhibits no edema or tenderness.  Neurological: He is alert and oriented to person, place, and time.  Skin: Skin is warm and dry.  Psychiatric: He has a normal mood and affect. His behavior is normal.  Nursing note and vitals reviewed.    ED Treatments / Results  Labs (all labs ordered are listed, but only abnormal results are displayed) Labs Reviewed  CBC WITH DIFFERENTIAL/PLATELET - Abnormal; Notable for the following components:      Result Value   RBC 4.04 (*)    Hemoglobin 11.1 (*)    HCT 33.9 (*)    All other components within normal limits  COMPREHENSIVE METABOLIC PANEL - Abnormal; Notable for the following components:   Glucose, Bld 481 (*)    BUN 27 (*)    Creatinine, Ser 1.76 (*)    GFR calc non Af Amer 38 (*)    GFR calc Af Amer 44 (*)    All other components within normal limits  ACETAMINOPHEN LEVEL - Abnormal; Notable for the following components:   Acetaminophen (Tylenol), Serum <10 (*)    All other components within normal limits  RAPID URINE DRUG SCREEN, HOSP PERFORMED - Abnormal; Notable for the following components:   Cocaine POSITIVE (*)    All other components within normal limits  CBG MONITORING, ED - Abnormal; Notable for the following components:   Glucose-Capillary 459 (*)    All other components within normal limits  TROPONIN I  ETHANOL  SALICYLATE LEVEL  TROPONIN I    EKG  EKG  Interpretation  Date/Time:  Tuesday December 02 2017 11:13:31 EST Ventricular Rate:  77 PR Interval:  158 QRS Duration: 76 QT Interval:  360 QTC Calculation: 407 R Axis:   36 Text Interpretation:  Sinus rhythm with marked sinus arrhythmia Nonspecific T wave abnormality Abnormal ECG Confirmed by Quintella Reichert [phone]) on 12/02/2017 1:04:32 PM       Radiology No results found.  Procedures Procedures (including critical care time)  Medications Ordered in ED Medications  insulin aspart (novoLOG) injection 0-9 Units (not administered)     Initial Impression / Assessment and Plan / ED Course  I have reviewed the triage vital signs and the nursing notes.  Pertinent labs & imaging results that were available during my care of the patient were reviewed by me and considered in my medical decision making (see chart for details).     Patient here for evaluation of chest pain and suicidal ideation.  In terms of his chest pain this is been ongoing for the last month.  EKG with no acute ischemic changes and troponin is negative x2.  He can follow-up with cardiology as an outpatient regarding his chest pain.  Current presentation is not consistent with ACS, dissection, PE.  In terms of his suicidal thoughts, recommend psychiatry input regarding management options.  Patient has been medically cleared for psychiatric evaluation and treatment.  Patient does have hyperglycemia on his labs and has been noncompliant with his medications.  Plan to reinstitute his home medications after reconciliation with pharmacy.  BMP with stable renal insufficiency.  Final Clinical Impressions(s) / ED Diagnoses   Final diagnoses:  None    ED Discharge Orders    None       Quintella Reichert, MD 12/02/17 1551

## 2017-12-03 LAB — CBG MONITORING, ED
GLUCOSE-CAPILLARY: 258 mg/dL — AB (ref 65–99)
GLUCOSE-CAPILLARY: 293 mg/dL — AB (ref 65–99)
GLUCOSE-CAPILLARY: 348 mg/dL — AB (ref 65–99)

## 2017-12-03 NOTE — ED Provider Notes (Signed)
Pt was cleared by psychiatry yday, and we shall d/c now. Pt's blood sugar is slightly elevated. He was not in DKA when he arrived, and has no labored respirations, nausea, abd pain, fevers. No concerns for infection. Pt was not getting his home dose of lantus while here, which is likely the cause. Pt will start taking his meds as  recommended once discharge. Results from the ER workup discussed with the patient face to face and all questions answered to the best of my ability.    Varney Biles, MD 12/03/17 1301

## 2017-12-03 NOTE — Progress Notes (Addendum)
Patient ID: Michael [name], male   DOB: 04/17/1951, 67 y.o.   MRN: [id number]  Late Entry:  Pt was seen and chart reviewed with treatment team. Pt has had multiple visits to the ED for chest pain and cocaine abuse. Pt was discharged from [facility] on 11-24-17 and went to [facility]. 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 Pt stated he can't stop smoking cocaine because of his living situation. Pt does not meet criteria for inpatient psychiatric admission and will be given outpatient resources for substance abuse treatment centers in the community. Pt's CBG on admission was over 400. Pt is clear from a psychiatric standpoint and may be discharged when medically stable.    Ethelene Hal, NP-C 10-02-2018   1230  Patient's chart reviewed and case discussed with the physician extender and developed treatment plan. Reviewed the information documented and agree with the treatment plan.  Buford Dresser, DO 12/03/17 12:49 PM

## 2017-12-03 NOTE — Progress Notes (Signed)
Inpatient Diabetes Program Recommendations  AACE/ADA: New Consensus Statement on Inpatient Glycemic Control (2015)  Target Ranges:  Prepandial:   less than 140 mg/dL      Peak postprandial:   less than 180 mg/dL (1-2 hours)      Critically ill patients:  140 - 180 mg/dL    Results for MILLS, MITTON (MRN [id number]) as of 12/03/2017 13:01  Ref. Range 12/02/2017 16:55 12/02/2017 18:36 12/03/2017 02:00 12/03/2017 09:21 12/03/2017 11:49  Glucose-Capillary Latest Ref Range: 65 - 99 mg/dL 374 (H) 290 (H) 258 (H) 293 (H) 348 (H)     Home DM Meds: Lantus 30 units QHS        Humalog 5 units TID        Metformin 1000 mg BID   Current Orders: Novolog Sensitive Correction Scale/ SSI (0-9 units) TID AC       MD- Please consider the following in-hospital insulin adjustments:  1. Start Lantus 30 units QHS  2. Start Novolog Meal Coverage: Novolog 5 units TID with meals (hold if pt eats <50% of meal)   (Use Glycemic Control Order set)     --Will follow patient during hospitalization--  Wyn Quaker RN, MSN, CDE Diabetes Coordinator Inpatient Glycemic Control Team Team Pager: [phone] (8a-5p)

## 2017-12-09 NOTE — Telephone Encounter (Signed)
FWD to PCP. Tempestt S Roberts, CMA  

## 2017-12-22 ENCOUNTER — Ambulatory Visit: Payer: Self-pay | Admitting: Psychology

## 2017-12-25 ENCOUNTER — Other Ambulatory Visit (INDEPENDENT_AMBULATORY_CARE_PROVIDER_SITE_OTHER): Payer: Self-pay | Admitting: Physician Assistant

## 2017-12-25 DIAGNOSIS — I10 Essential (primary) hypertension: Secondary | ICD-10-CM

## 2017-12-25 NOTE — Telephone Encounter (Signed)
FWD to PCP. Tempestt S Roberts, CMA  

## 2017-12-31 DIAGNOSIS — M79673 Pain in unspecified foot: Secondary | ICD-10-CM | POA: Diagnosis not present

## 2017-12-31 DIAGNOSIS — M899 Disorder of bone, unspecified: Secondary | ICD-10-CM | POA: Diagnosis not present

## 2017-12-31 DIAGNOSIS — Y998 Other external cause status: Secondary | ICD-10-CM | POA: Diagnosis not present

## 2017-12-31 DIAGNOSIS — S99822A Other specified injuries of left foot, initial encounter: Secondary | ICD-10-CM | POA: Diagnosis not present

## 2017-12-31 DIAGNOSIS — S99922A Unspecified injury of left foot, initial encounter: Secondary | ICD-10-CM | POA: Diagnosis not present

## 2017-12-31 DIAGNOSIS — X58XXXA Exposure to other specified factors, initial encounter: Secondary | ICD-10-CM | POA: Diagnosis not present

## 2018-01-01 IMAGING — CR DG CHEST 2V
2 series · 2 of 2 positions shown · non-contrast
Comparison: 12/15/2015.

CLINICAL DATA: Shortness of breath.

EXAM:
CHEST  2 VIEW

[w chest pa]
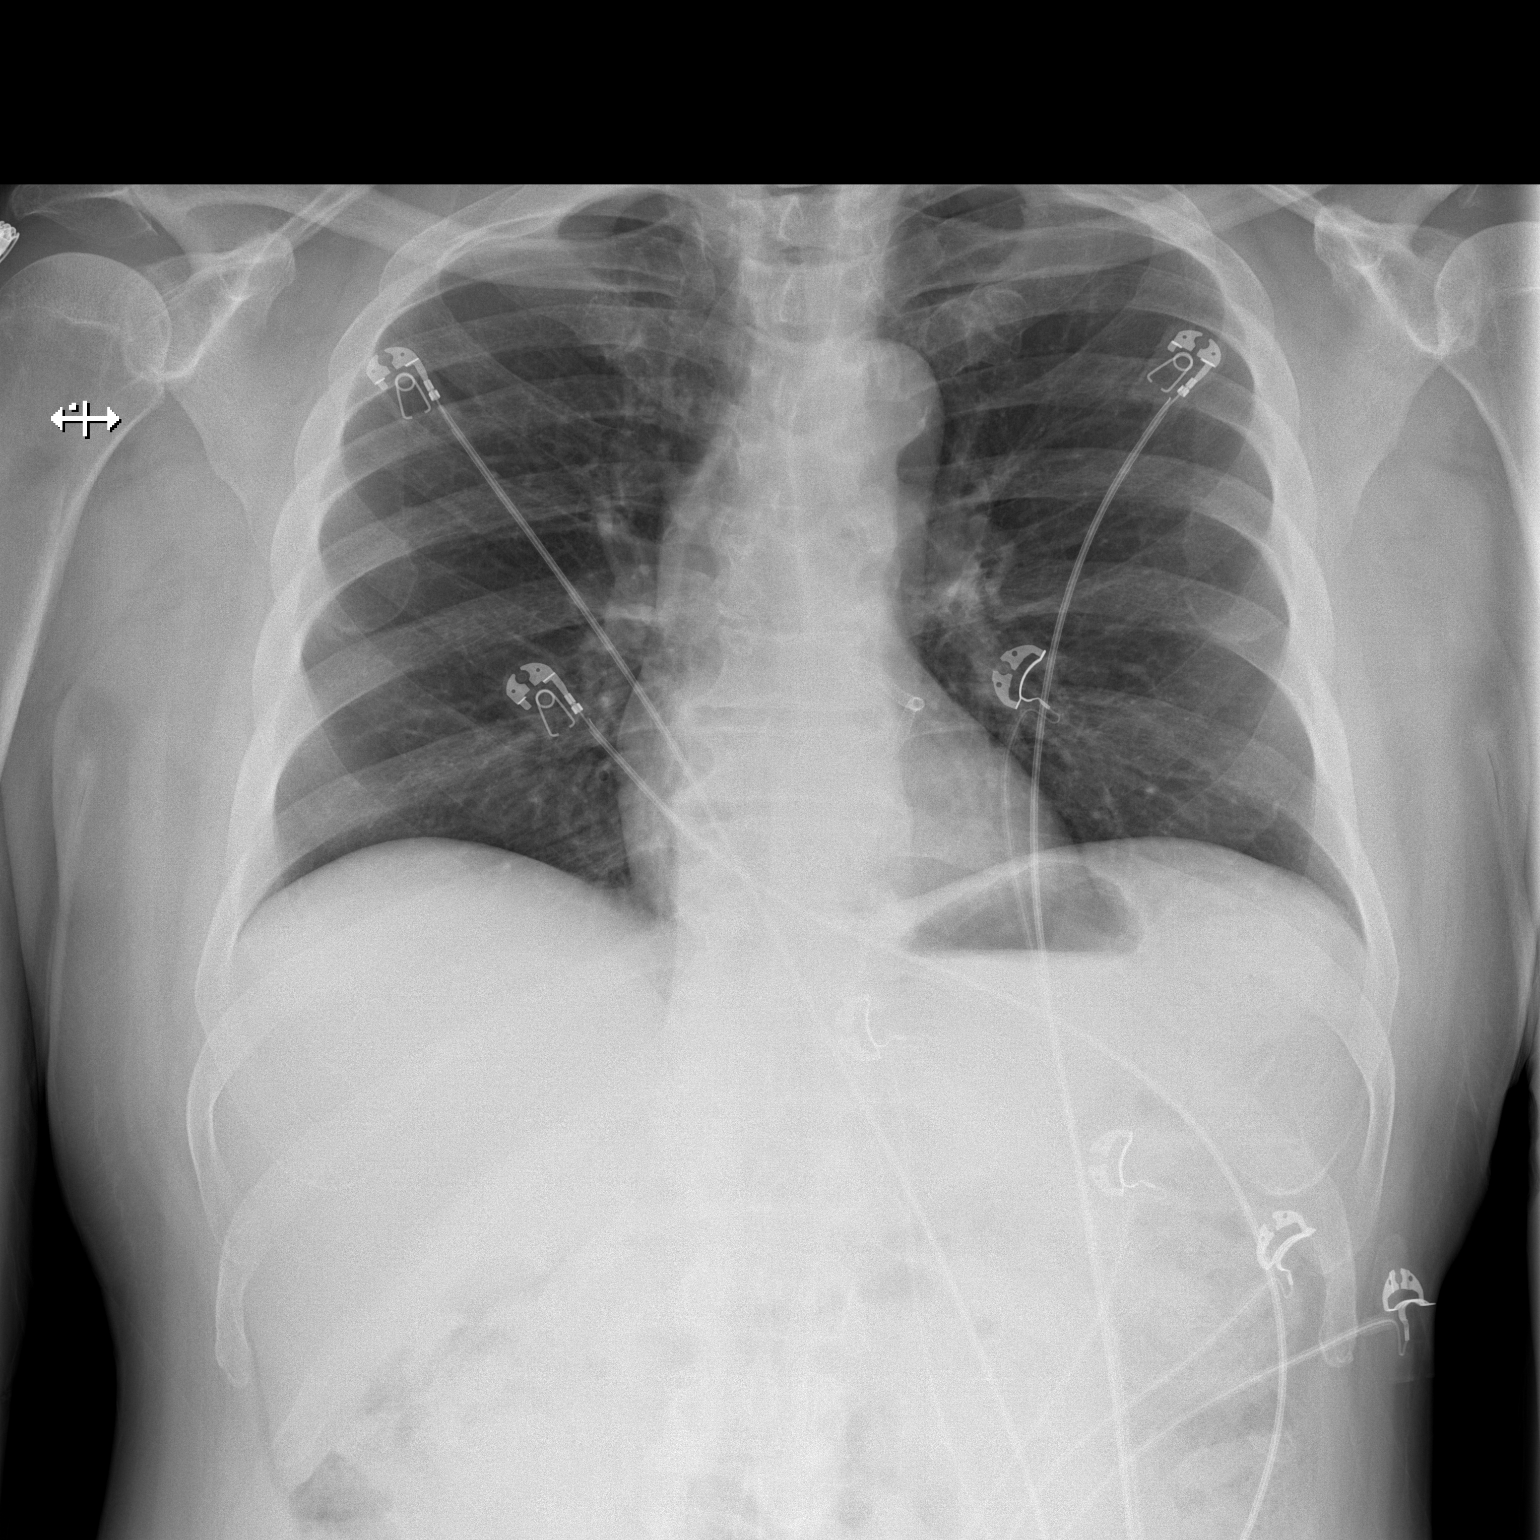

[w chest lat]
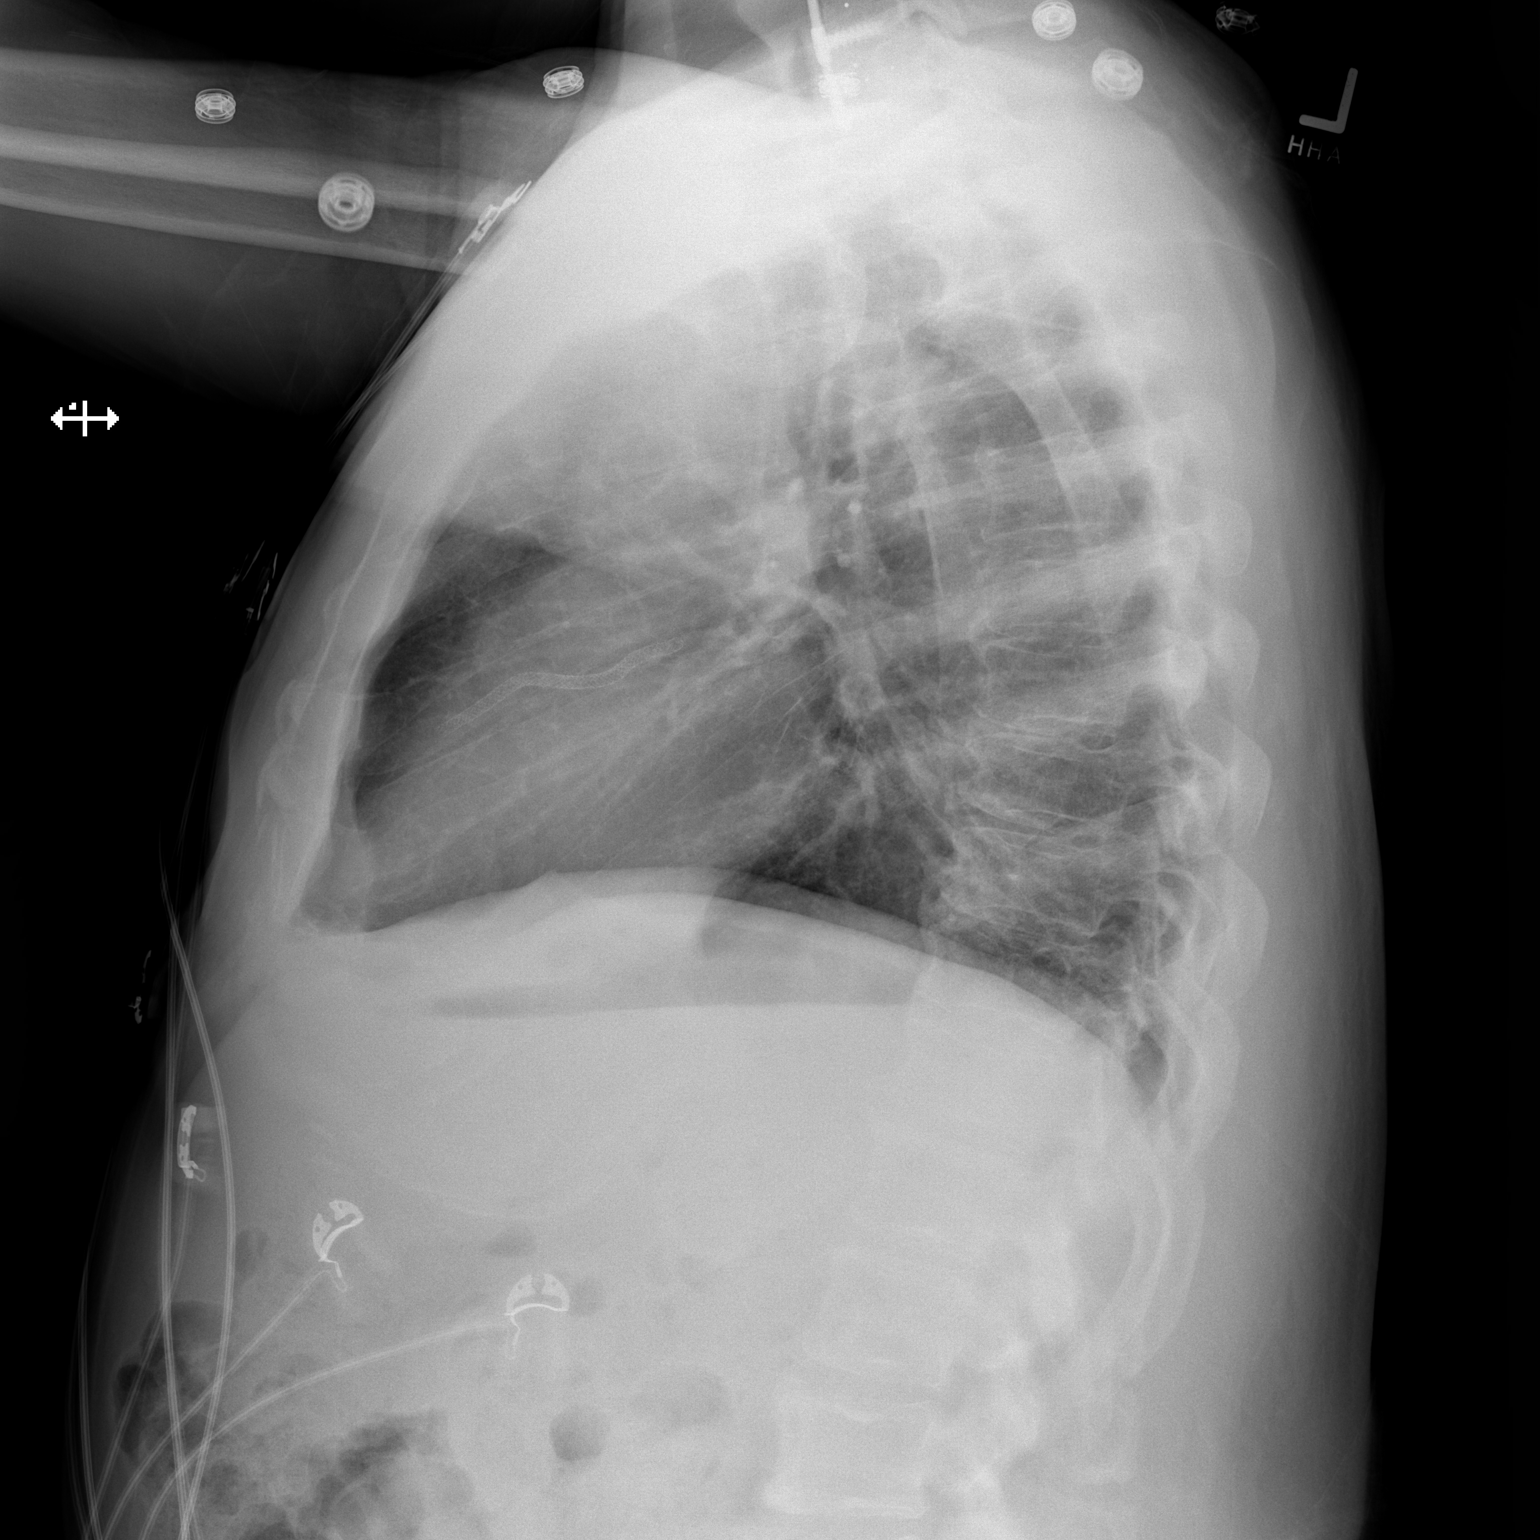

[2 of 2 positions shown; findings below may reference images not displayed]

FINDINGS: Mediastinum and hilar structures normal. Lungs are clear. Heart size
normal. No pleural effusion or pneumothorax. Prior cervicothoracic
spine fusion .
IMPRESSION: No acute cardiopulmonary disease. Prior cervicothoracic spine
fusion.

## 2018-01-05 IMAGING — DX DG CHEST 2V
2 series · 2 of 2 positions shown · non-contrast
Comparison: Chest radiograph dated 11/22/2016

CLINICAL DATA: 65-year-old male with shortness of breath

EXAM:
CHEST  2 VIEW

[w chest pa]
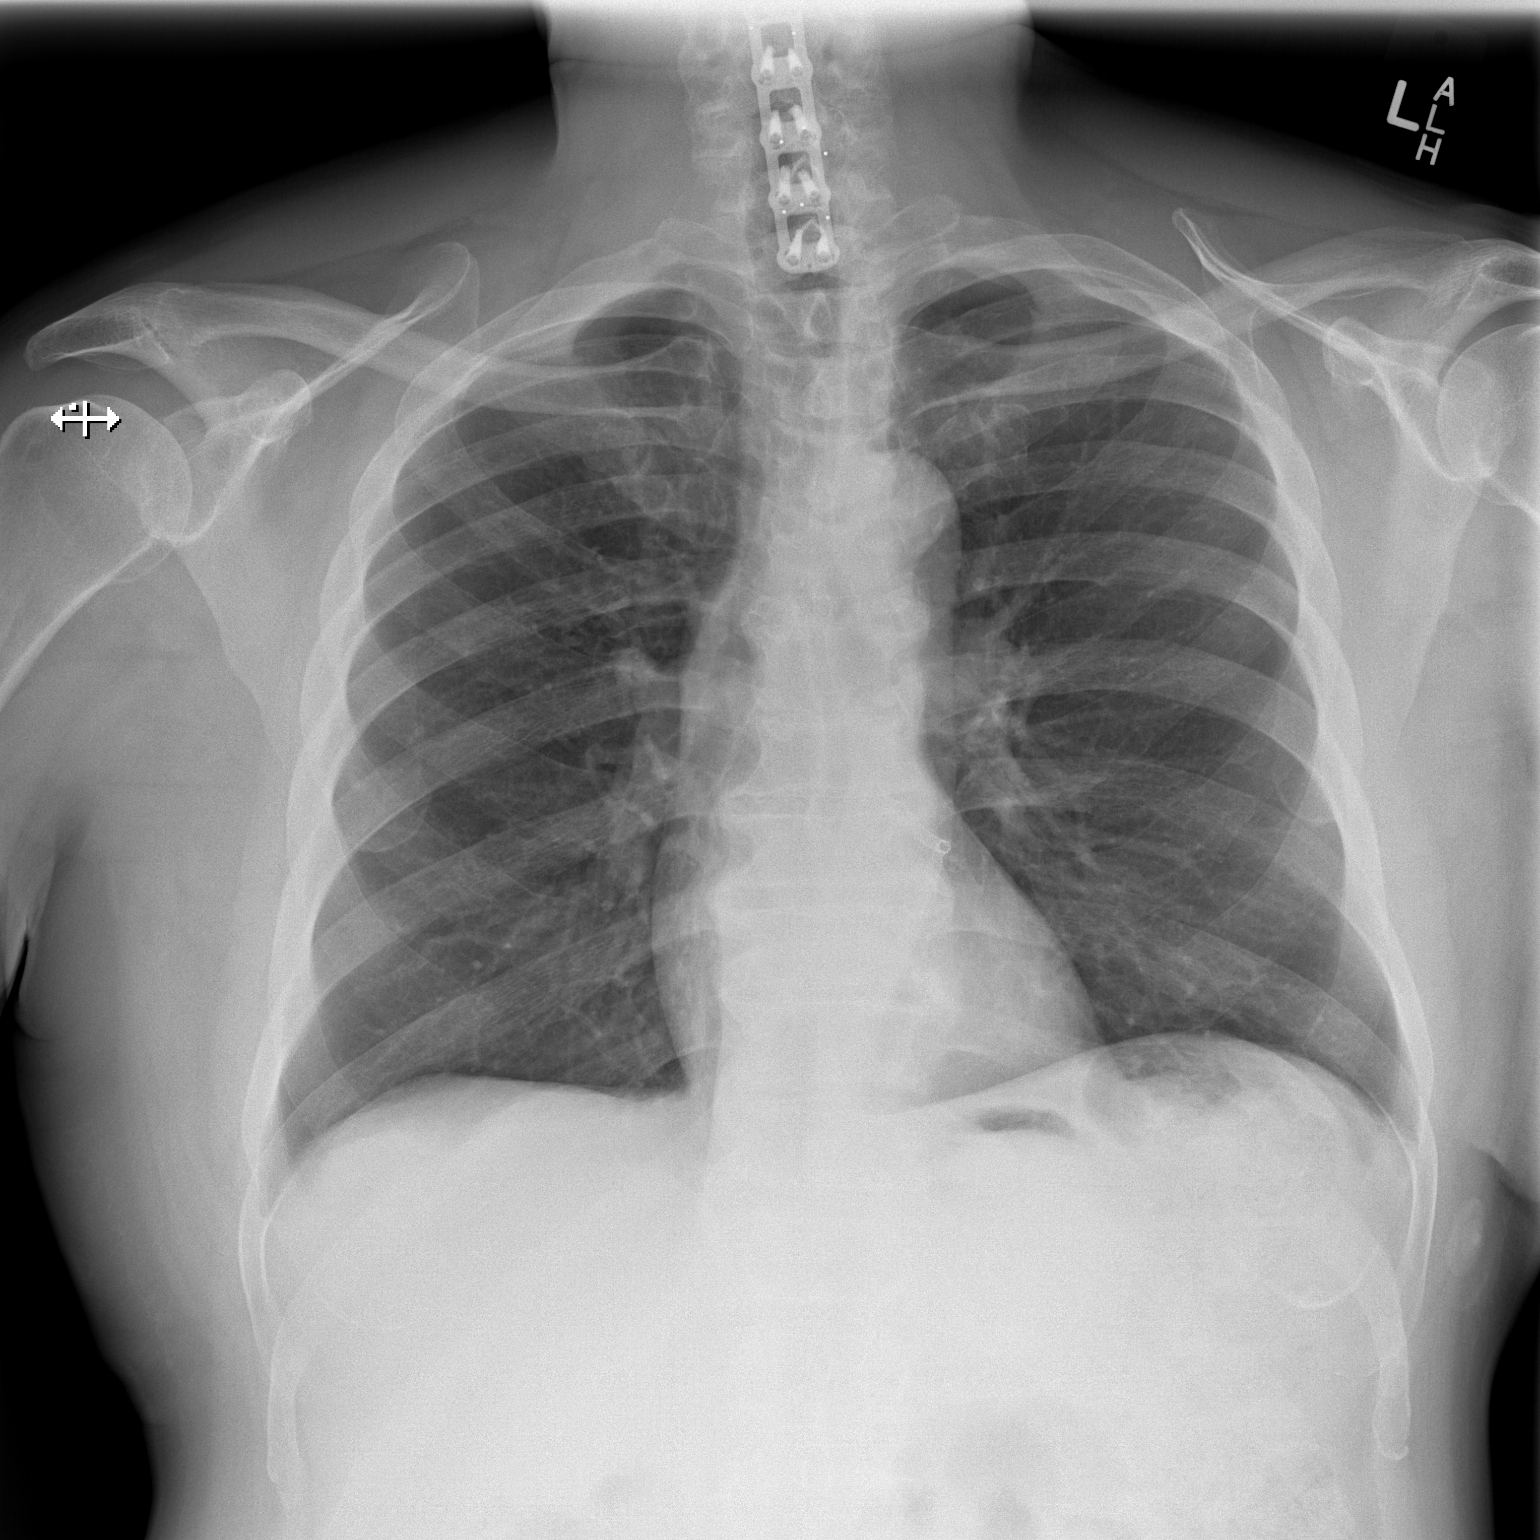

[w chest lat]
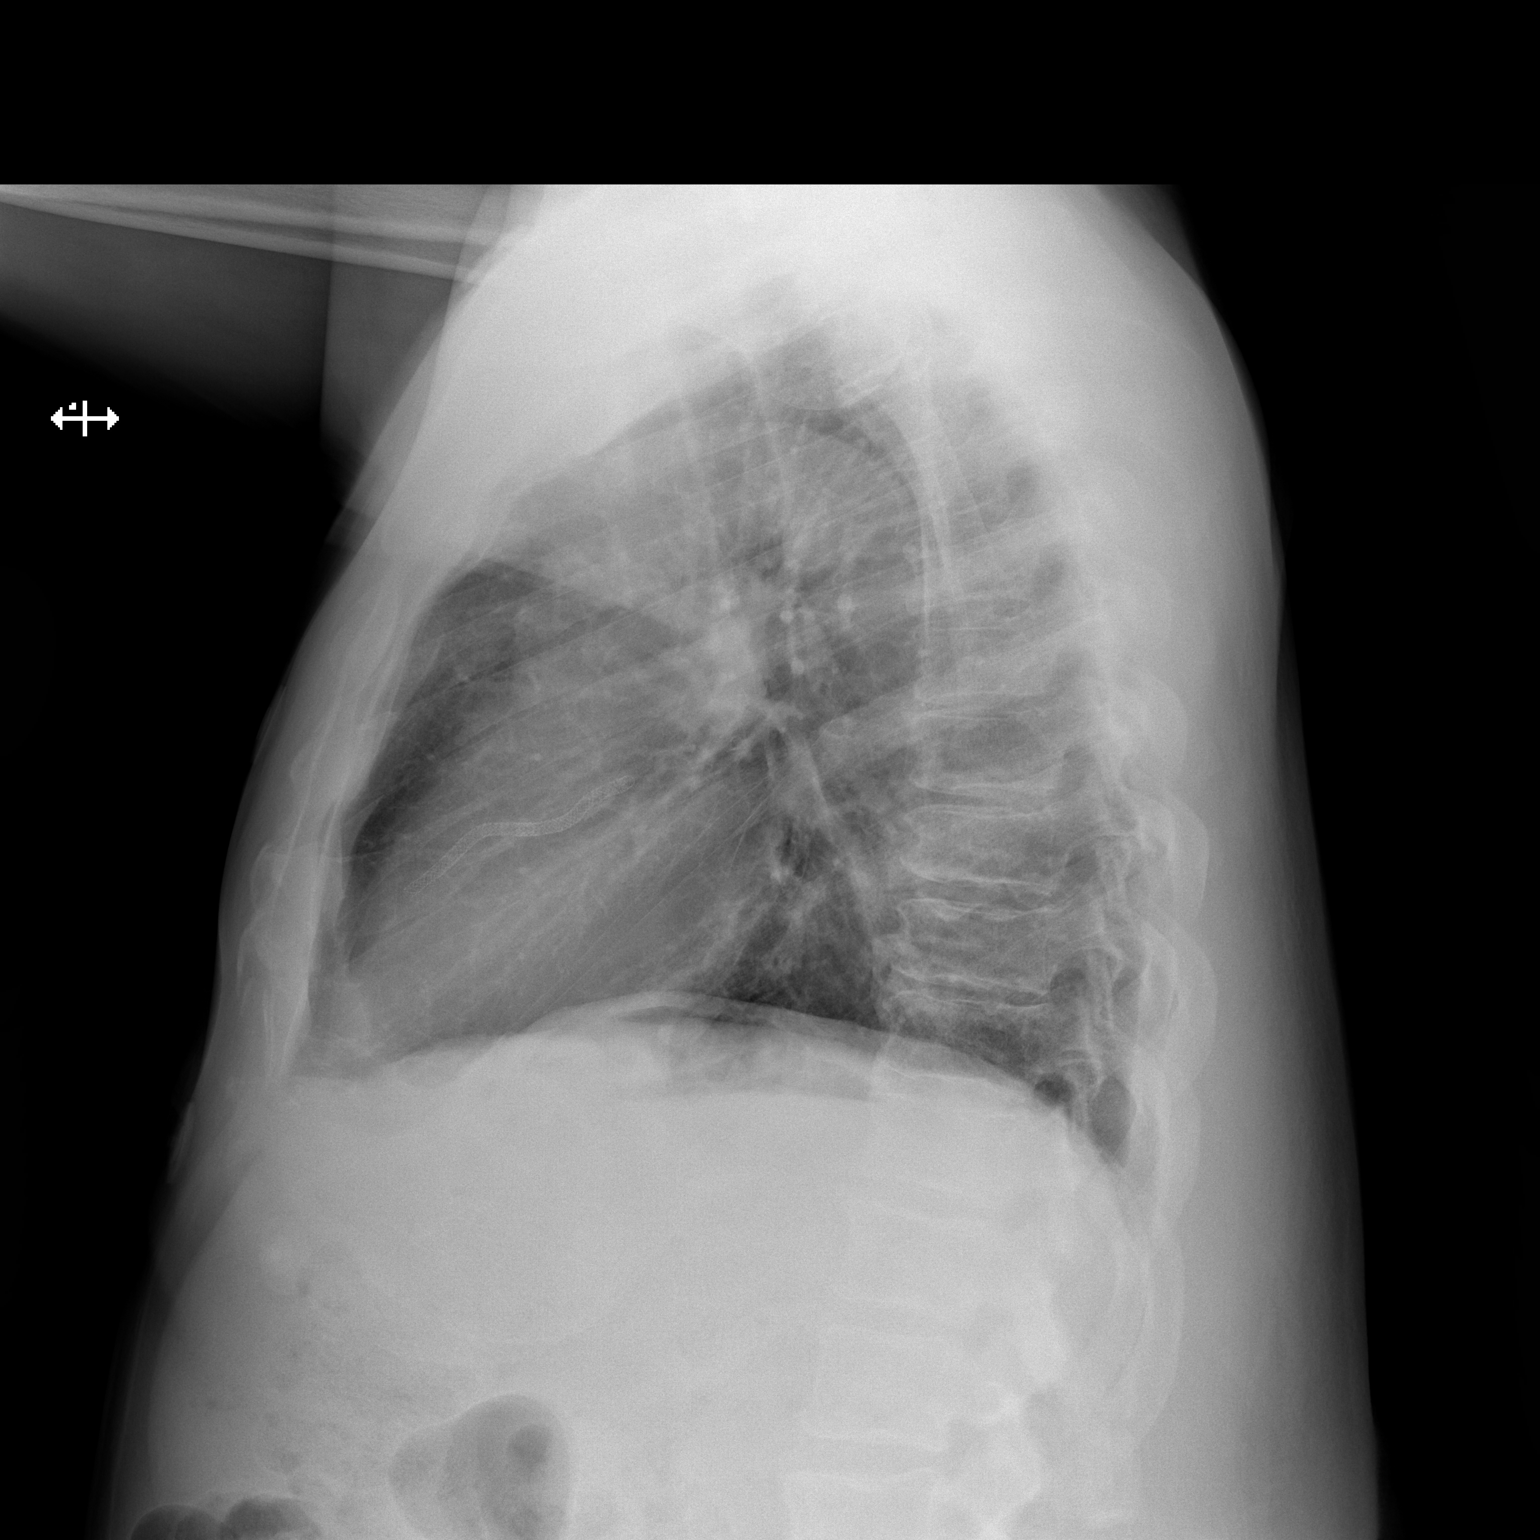

[2 of 2 positions shown; findings below may reference images not displayed]

FINDINGS: The lungs are clear. There is no pleural effusion or pneumothorax.
The cardiac silhouette is within normal limits. A coronary bypass
graft is noted. Anterior cervical fusion hardware. Degenerative
changes of the spine. No acute osseous pathology.
IMPRESSION: No active cardiopulmonary disease.

## 2018-01-16 ENCOUNTER — Other Ambulatory Visit (INDEPENDENT_AMBULATORY_CARE_PROVIDER_SITE_OTHER): Payer: Self-pay | Admitting: Physician Assistant

## 2018-01-19 NOTE — Telephone Encounter (Signed)
Patient needs to be seen in office to receive additional refills and establish care

## 2018-01-19 NOTE — Telephone Encounter (Signed)
Please refill if appropriate

## 2018-01-21 DIAGNOSIS — R072 Precordial pain: Secondary | ICD-10-CM | POA: Diagnosis not present

## 2018-01-21 DIAGNOSIS — I110 Hypertensive heart disease with heart failure: Secondary | ICD-10-CM | POA: Diagnosis not present

## 2018-01-21 DIAGNOSIS — I491 Atrial premature depolarization: Secondary | ICD-10-CM | POA: Diagnosis not present

## 2018-01-21 DIAGNOSIS — I5030 Unspecified diastolic (congestive) heart failure: Secondary | ICD-10-CM | POA: Diagnosis not present

## 2018-01-21 DIAGNOSIS — I252 Old myocardial infarction: Secondary | ICD-10-CM | POA: Diagnosis not present

## 2018-01-21 DIAGNOSIS — I25110 Atherosclerotic heart disease of native coronary artery with unstable angina pectoris: Secondary | ICD-10-CM | POA: Diagnosis not present

## 2018-01-21 DIAGNOSIS — Z955 Presence of coronary angioplasty implant and graft: Secondary | ICD-10-CM | POA: Diagnosis not present

## 2018-01-21 DIAGNOSIS — T82855A Stenosis of coronary artery stent, initial encounter: Secondary | ICD-10-CM | POA: Diagnosis not present

## 2018-01-21 DIAGNOSIS — I10 Essential (primary) hypertension: Secondary | ICD-10-CM | POA: Diagnosis not present

## 2018-01-22 ENCOUNTER — Other Ambulatory Visit (INDEPENDENT_AMBULATORY_CARE_PROVIDER_SITE_OTHER): Payer: Self-pay | Admitting: Physician Assistant

## 2018-01-25 IMAGING — DX DG SHOULDER 2+V*L*
2 series · 2 of 2 positions shown · non-contrast
Comparison: None.

CLINICAL DATA: 65 y/o  M; status post fall with pain.

EXAM:
LEFT SHOULDER - 2+ VIEW

[shoulder grashey]
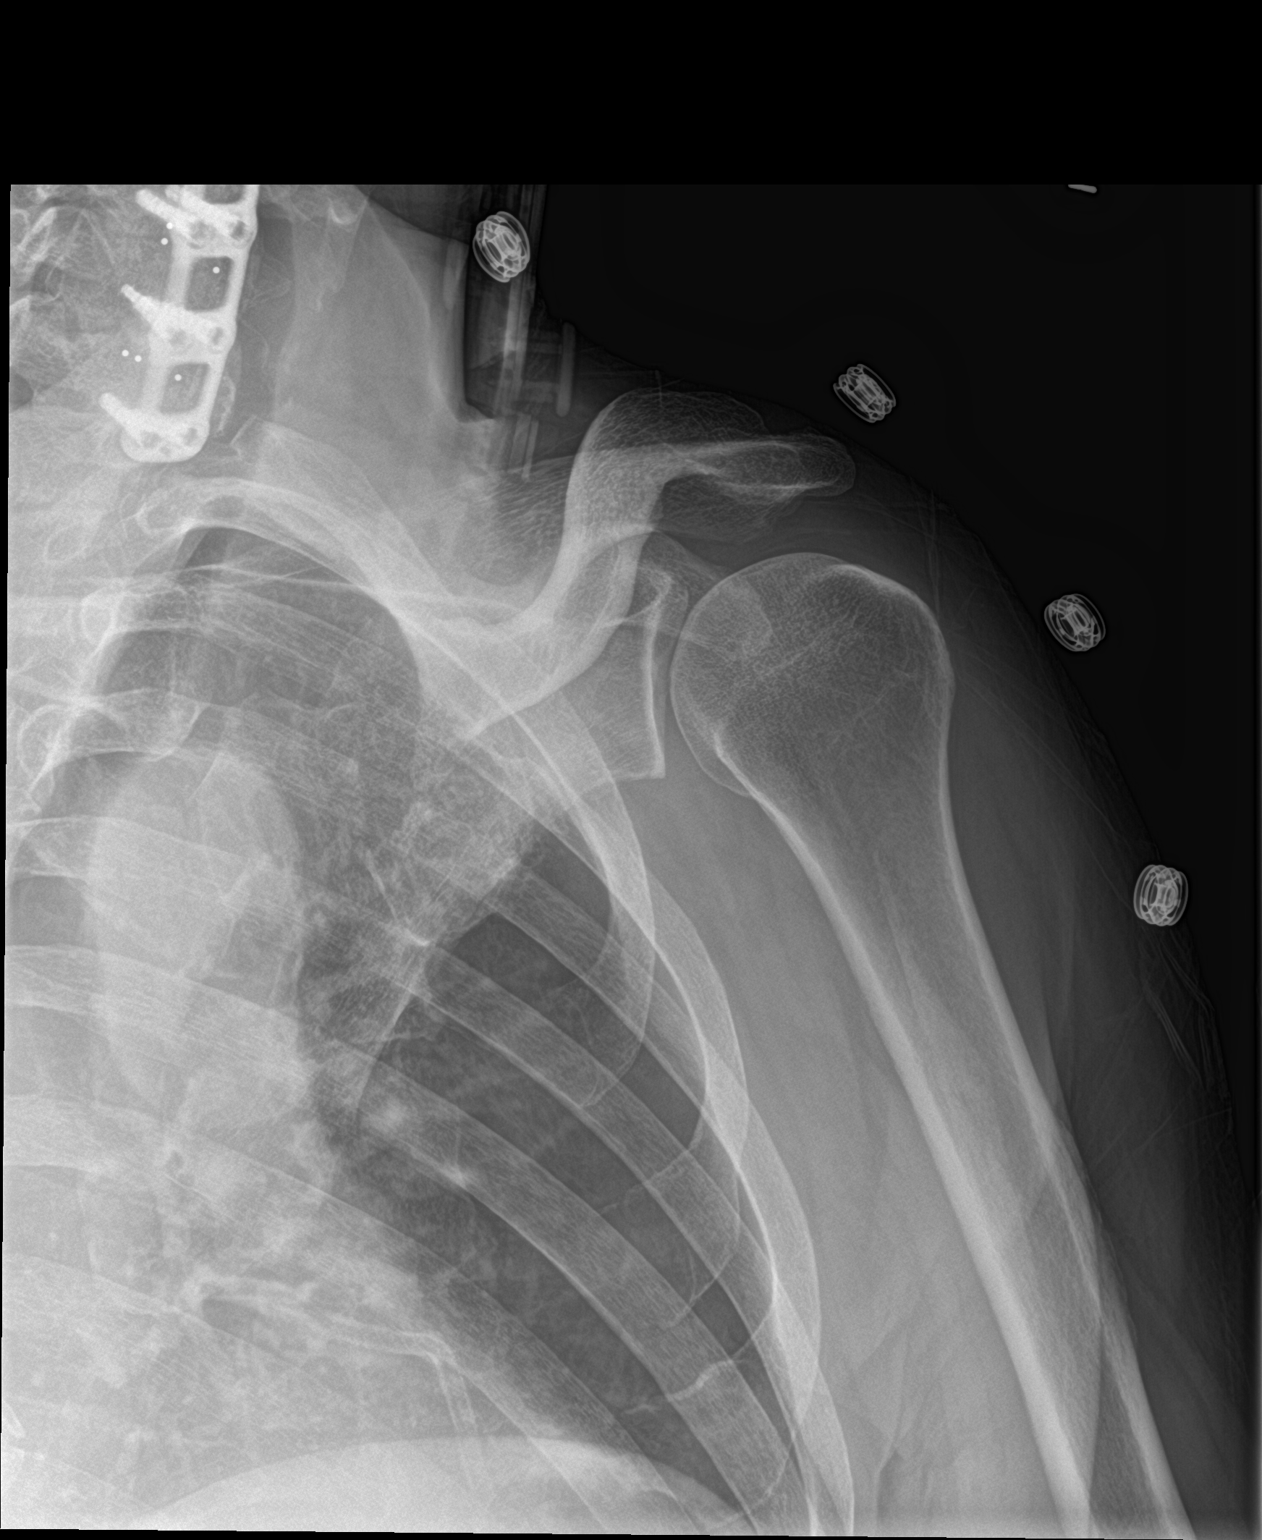

[shoulder y view]
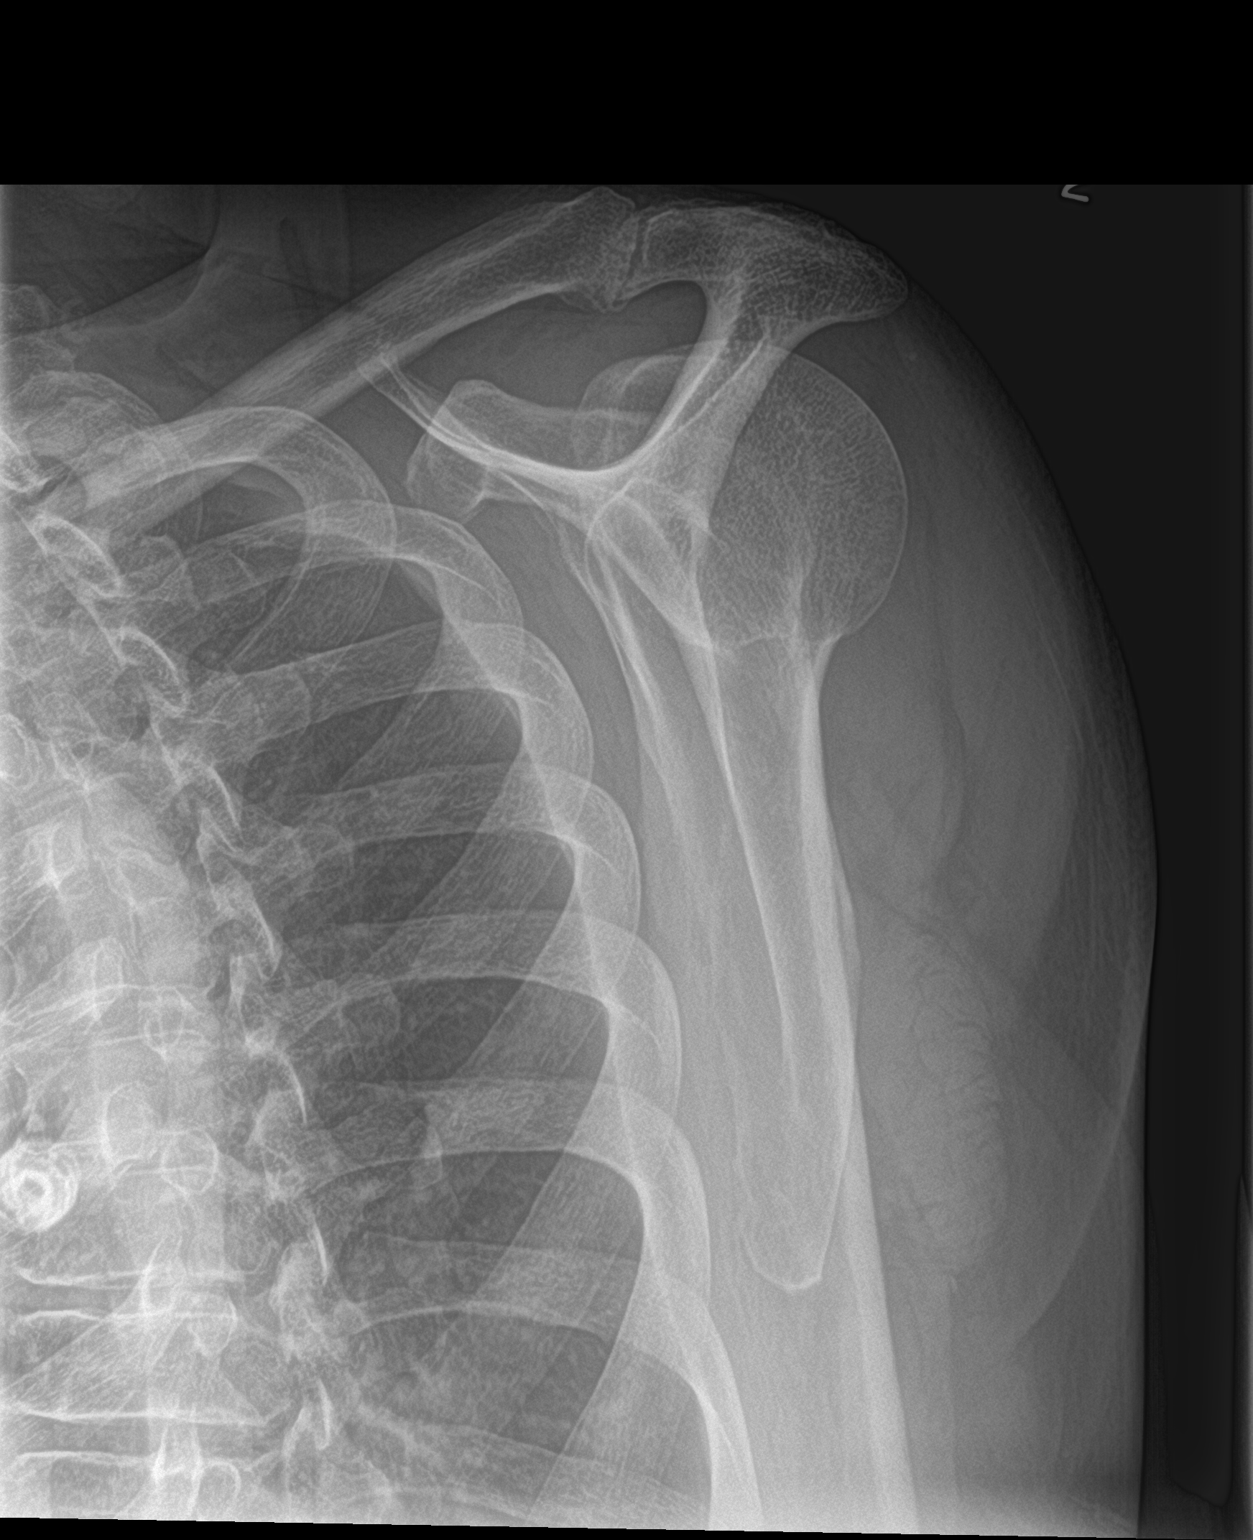

[2 of 2 positions shown; findings below may reference images not displayed]

FINDINGS: There is no evidence of fracture or dislocation. Mild osteoarthrosis
of the acromioclavicular joint with osteophytosis.
IMPRESSION: No acute fracture or dislocation identified. Mild left
acromioclavicular joint osteoarthrosis.

By: Markos Fraga M.D.

## 2018-01-25 IMAGING — DX DG HIP (WITH OR WITHOUT PELVIS) 3-4V BILAT
5 series · 5 of 5 positions shown · non-contrast
Comparison: None.

CLINICAL DATA: Pain after fall

EXAM:
DG HIP (WITH OR WITHOUT PELVIS) 3-4V BILAT

[pelvis ap]
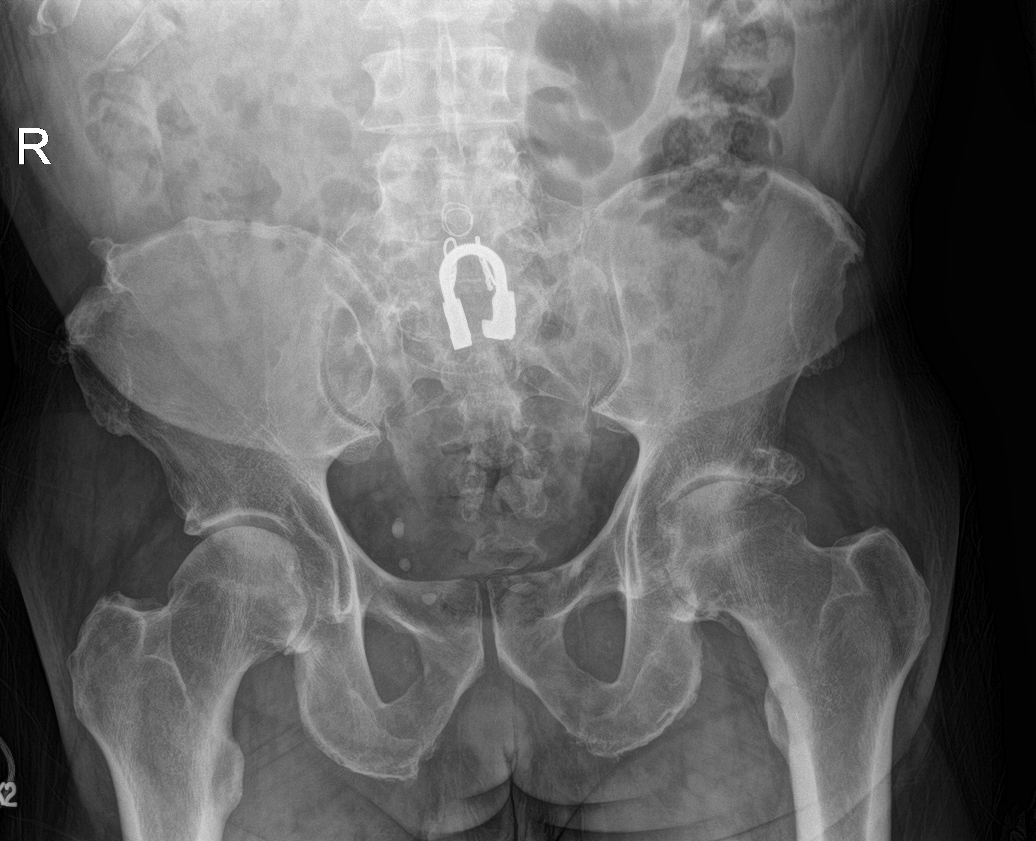

[hip ap (1 of 2)]
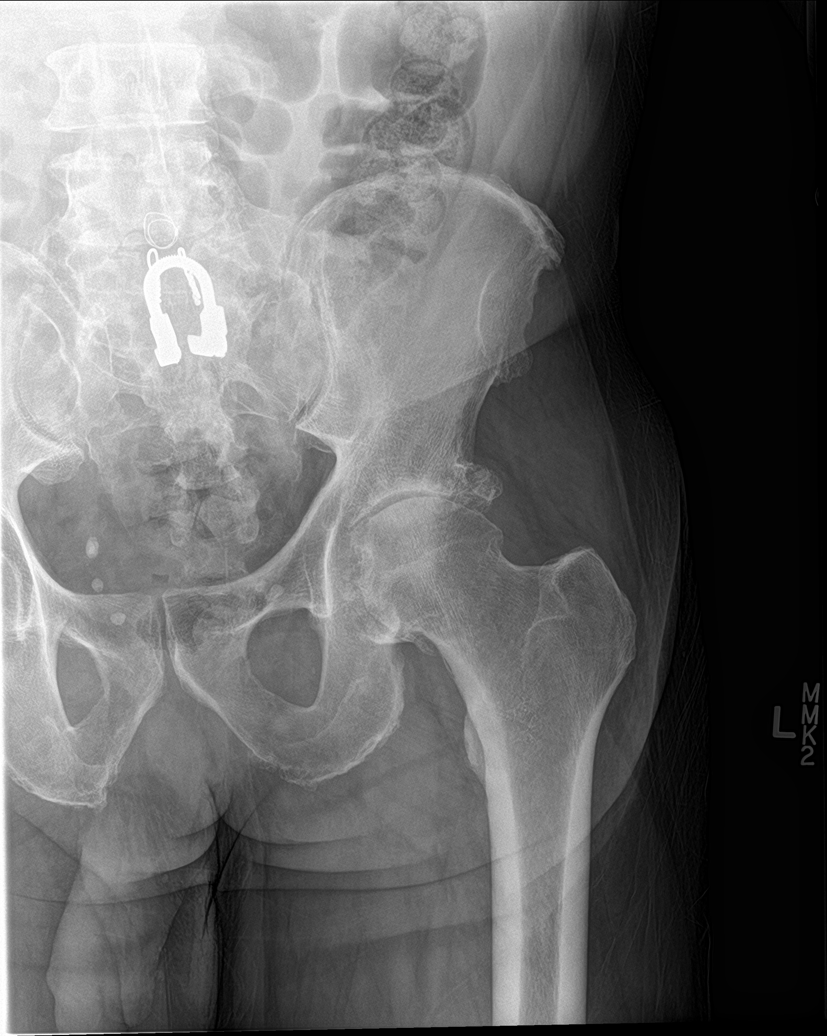

[hip lat (1 of 2)]
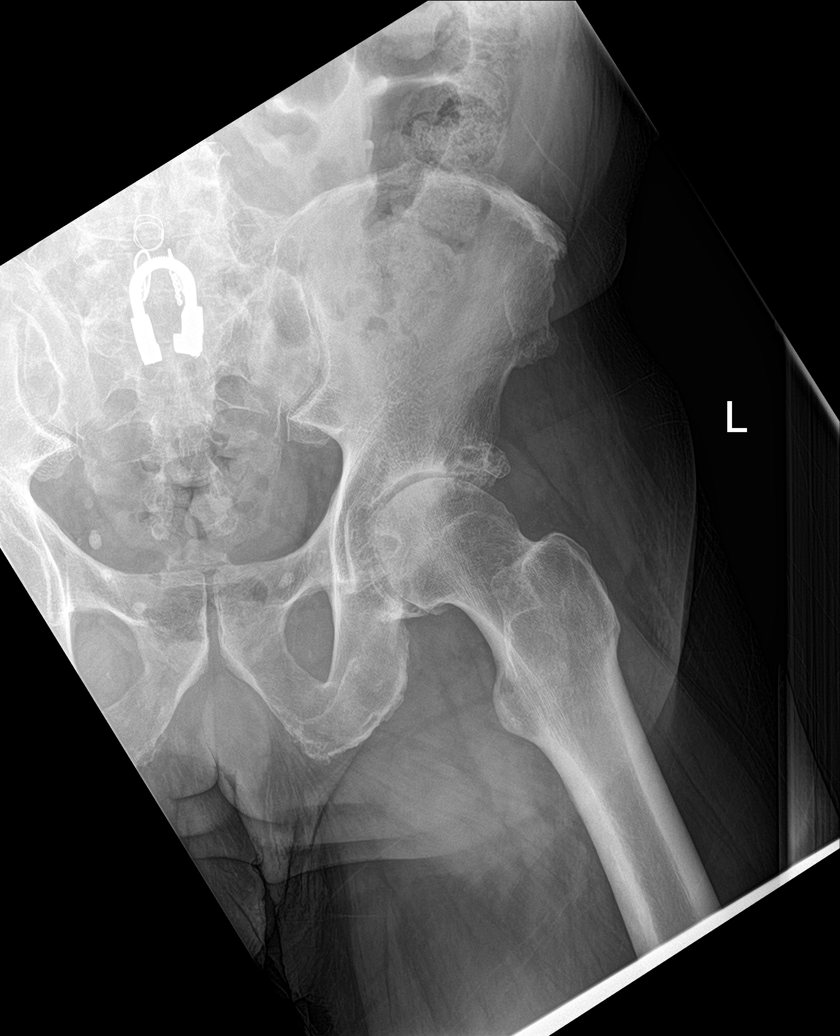

[hip ap (2 of 2)]
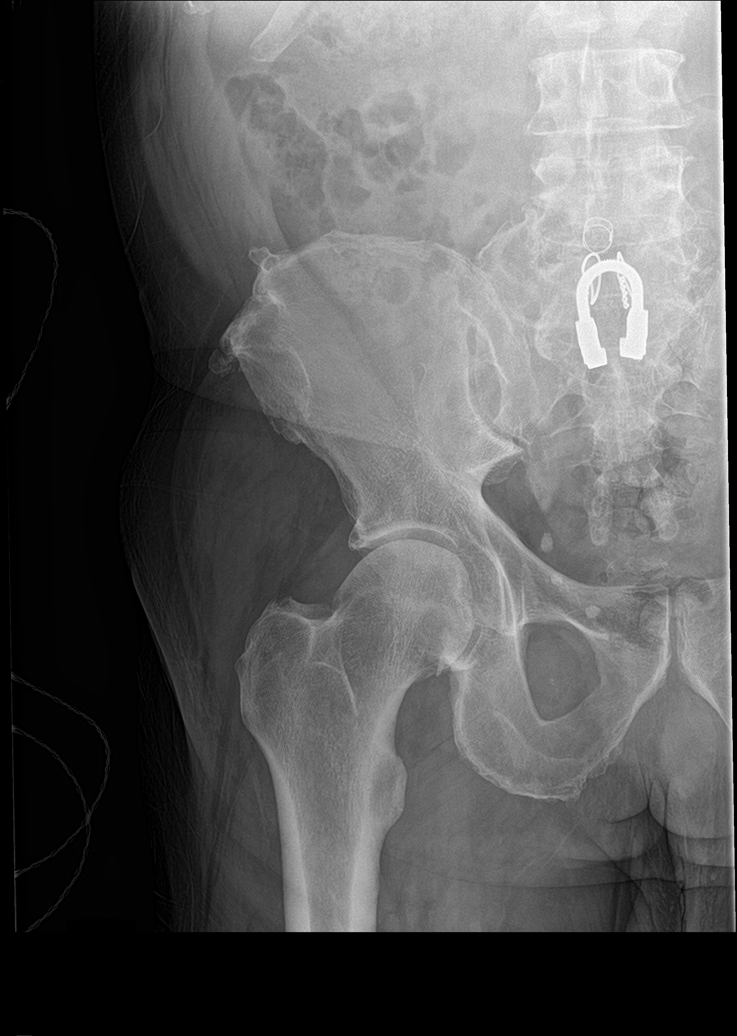

[hip lat (2 of 2)]
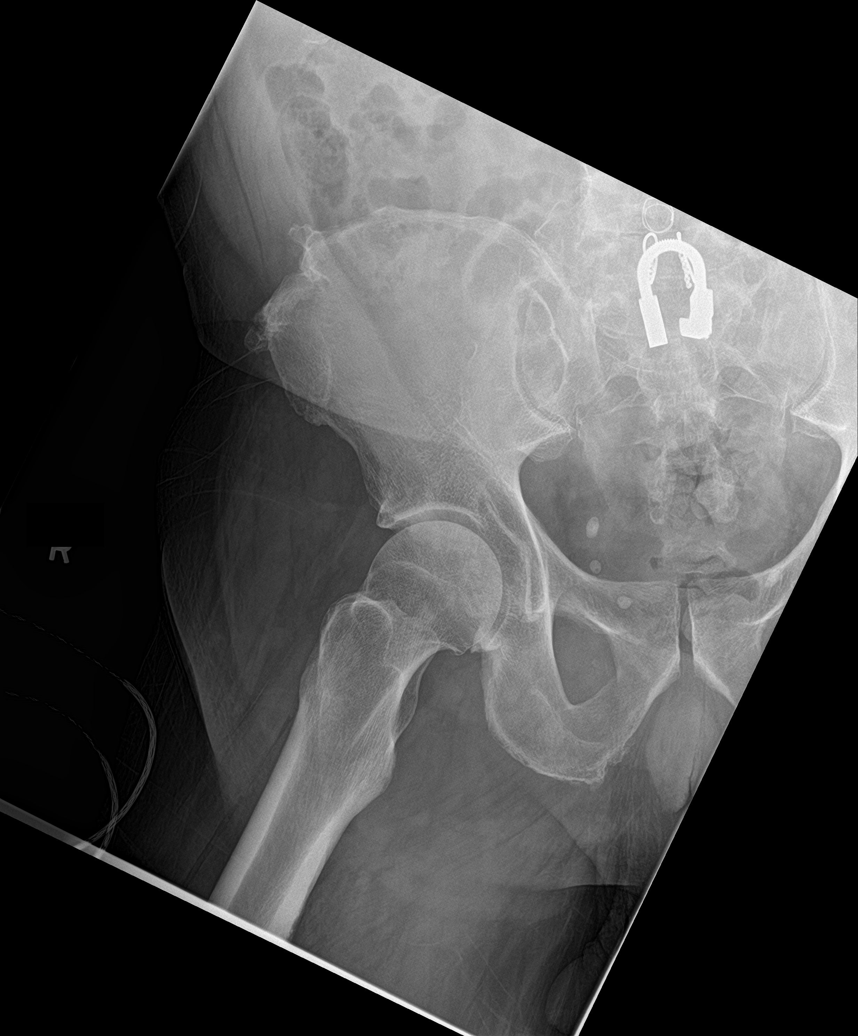

[5 of 5 positions shown; findings below may reference images not displayed]

FINDINGS: There are mild degenerative changes in the right hip and moderate to
severe degenerative changes in the left hip. No fractures.
IMPRESSION: Mild degenerative changes in the right hip and moderate degenerative
changes in the left hip. No fracture or dislocation.

## 2018-01-25 IMAGING — DX DG KNEE 1-2V*L*
2 series · 2 of 2 positions shown · non-contrast
Comparison: None.

CLINICAL DATA: Pain after fall

EXAM:
LEFT KNEE - 1-2 VIEW

[knee ap]
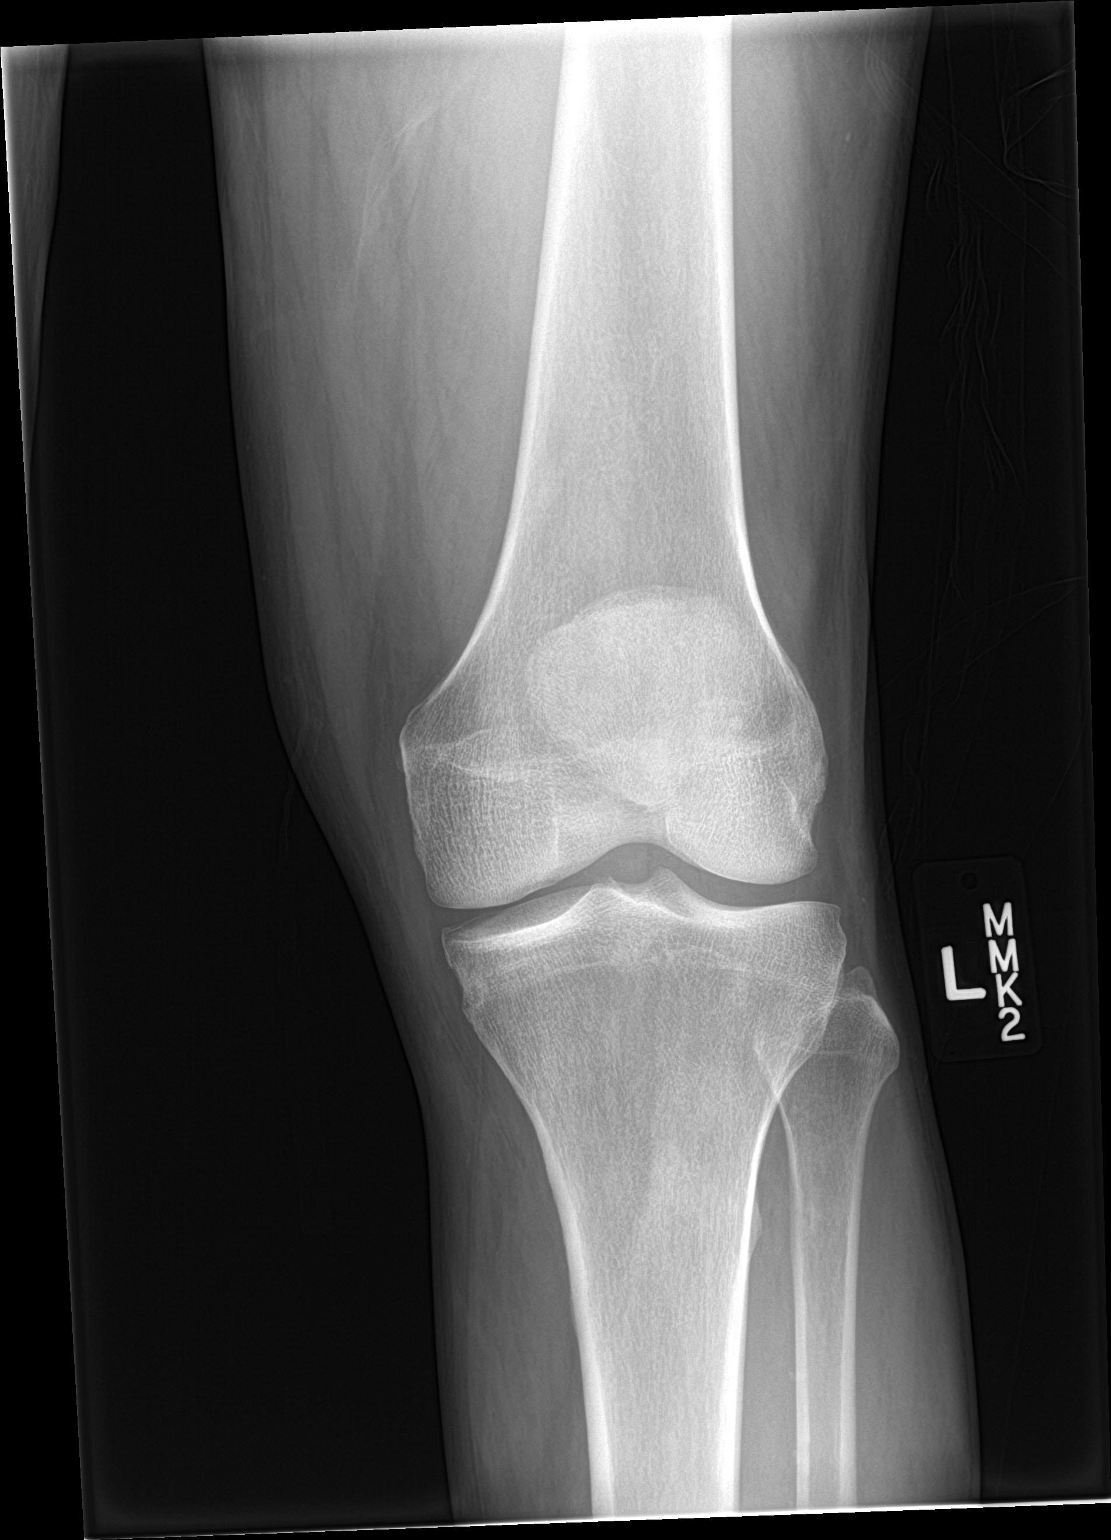

[knee lat]
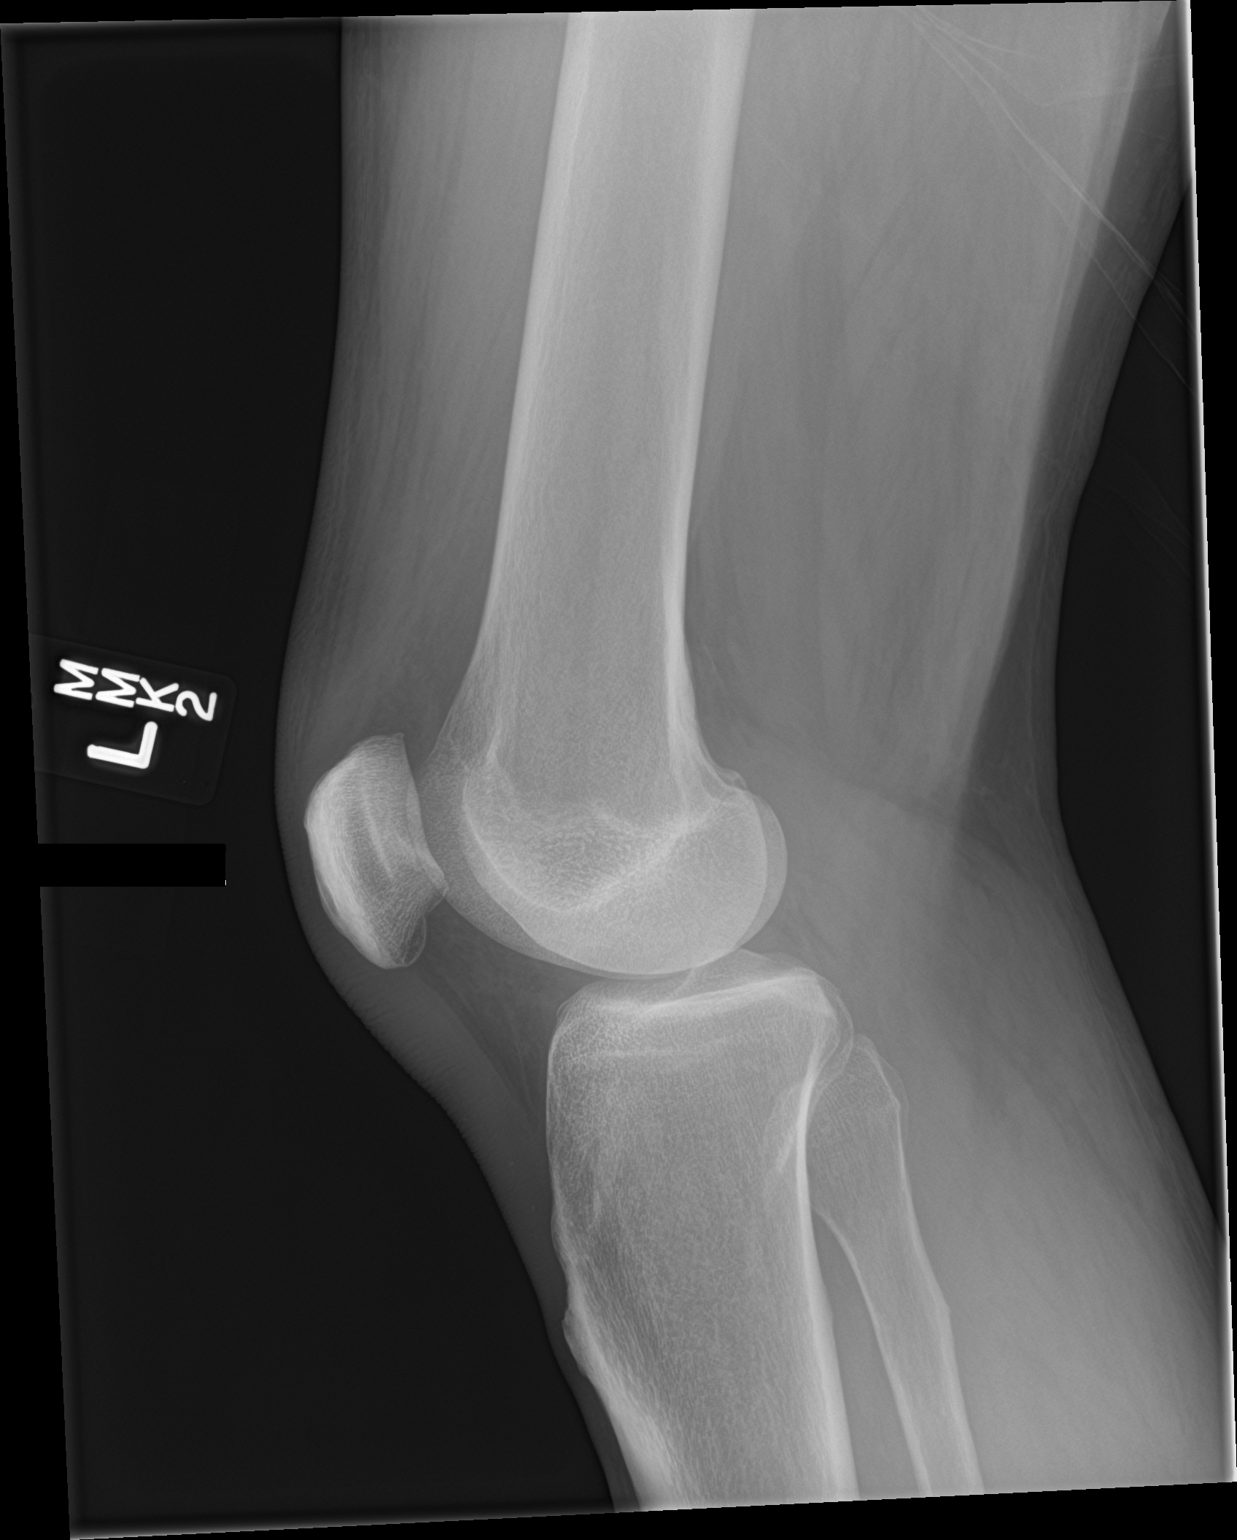

[2 of 2 positions shown; findings below may reference images not displayed]

FINDINGS: No evidence of fracture, dislocation, or joint effusion. No evidence
of arthropathy or other focal bone abnormality. Soft tissues are
unremarkable.
IMPRESSION: Negative.

## 2018-01-25 IMAGING — DX DG CHEST 2V
2 series · 2 of 2 positions shown · non-contrast
Comparison: 12/08/2016

CLINICAL DATA: Fall.  Initial encounter.

EXAM:
CHEST  2 VIEW

[chest pa]
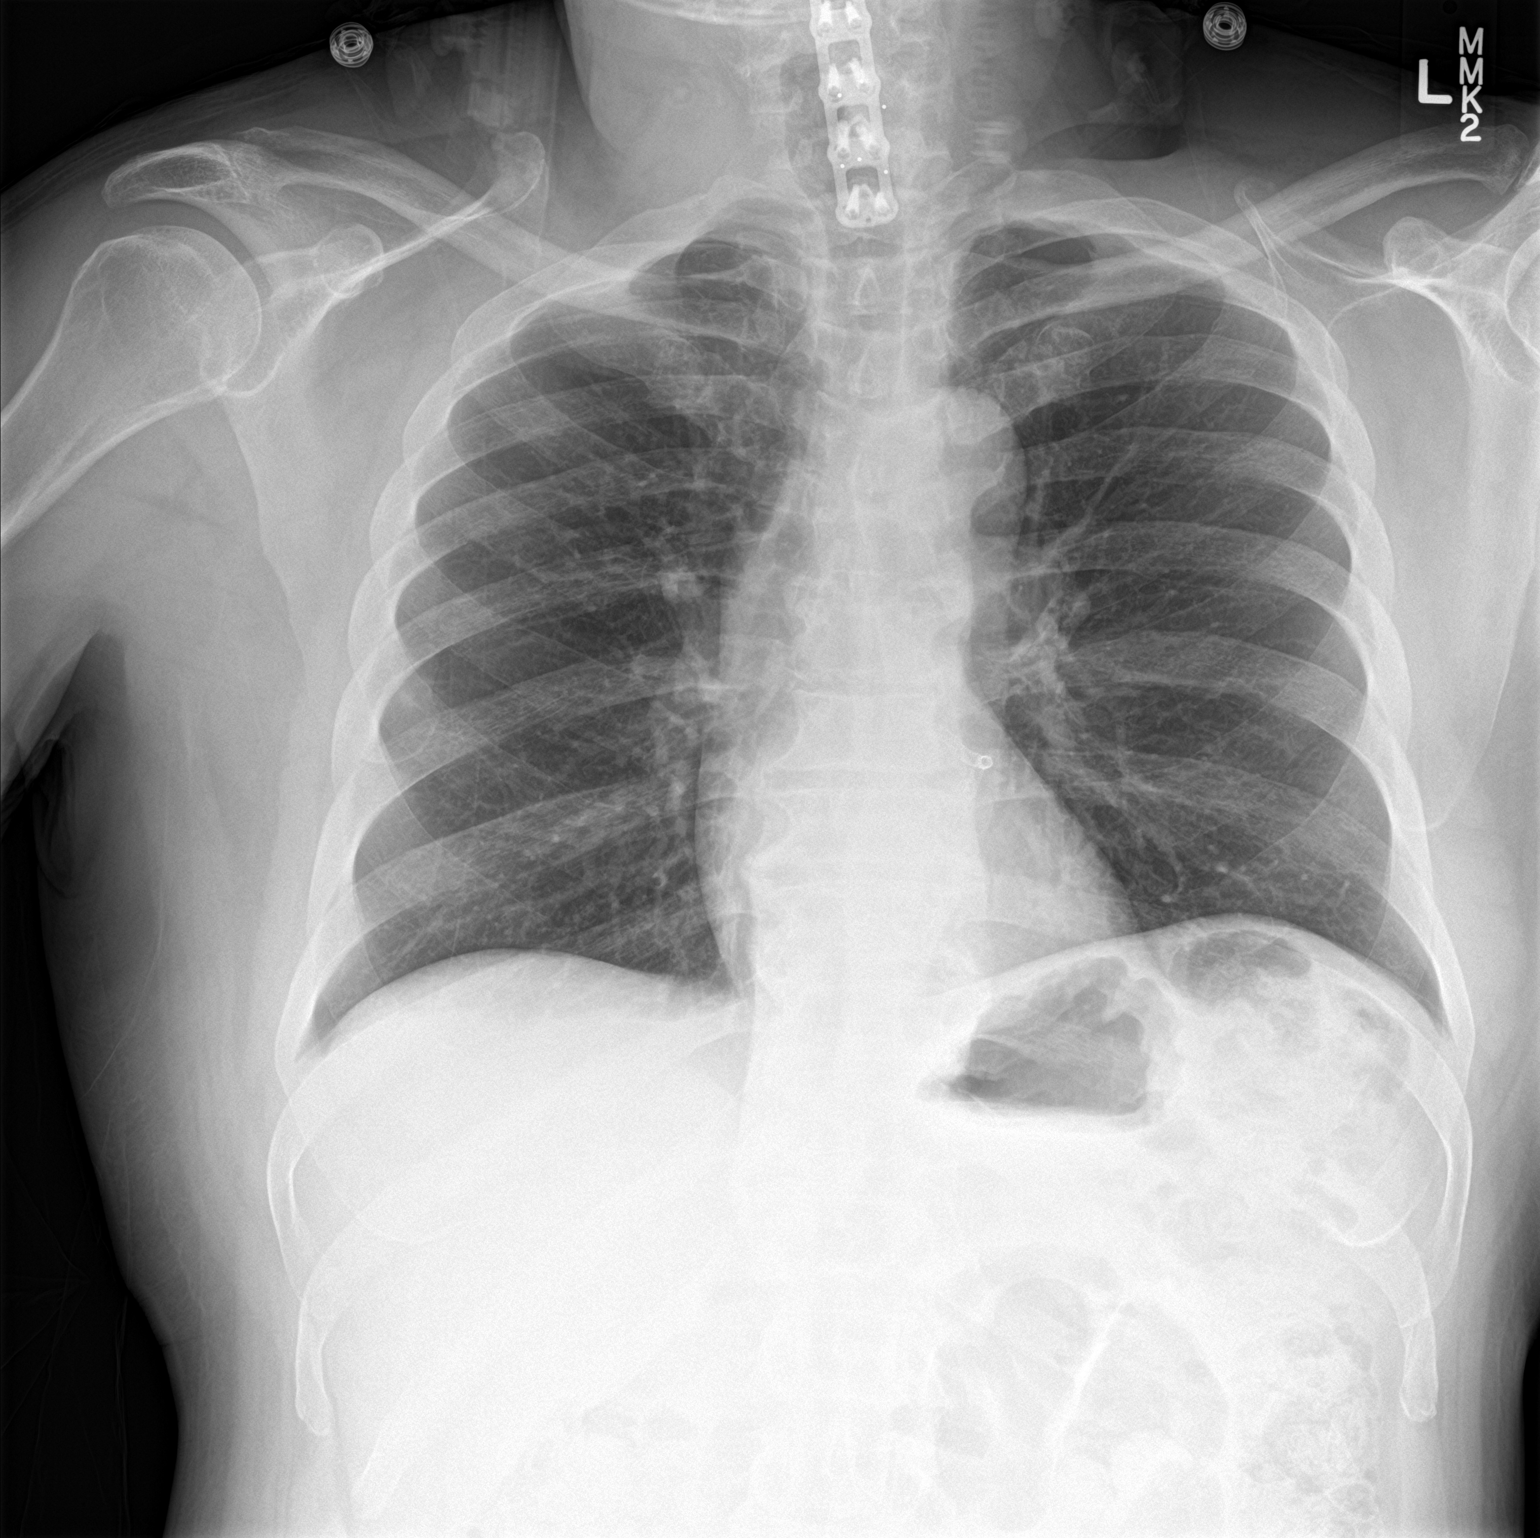

[chest lat]
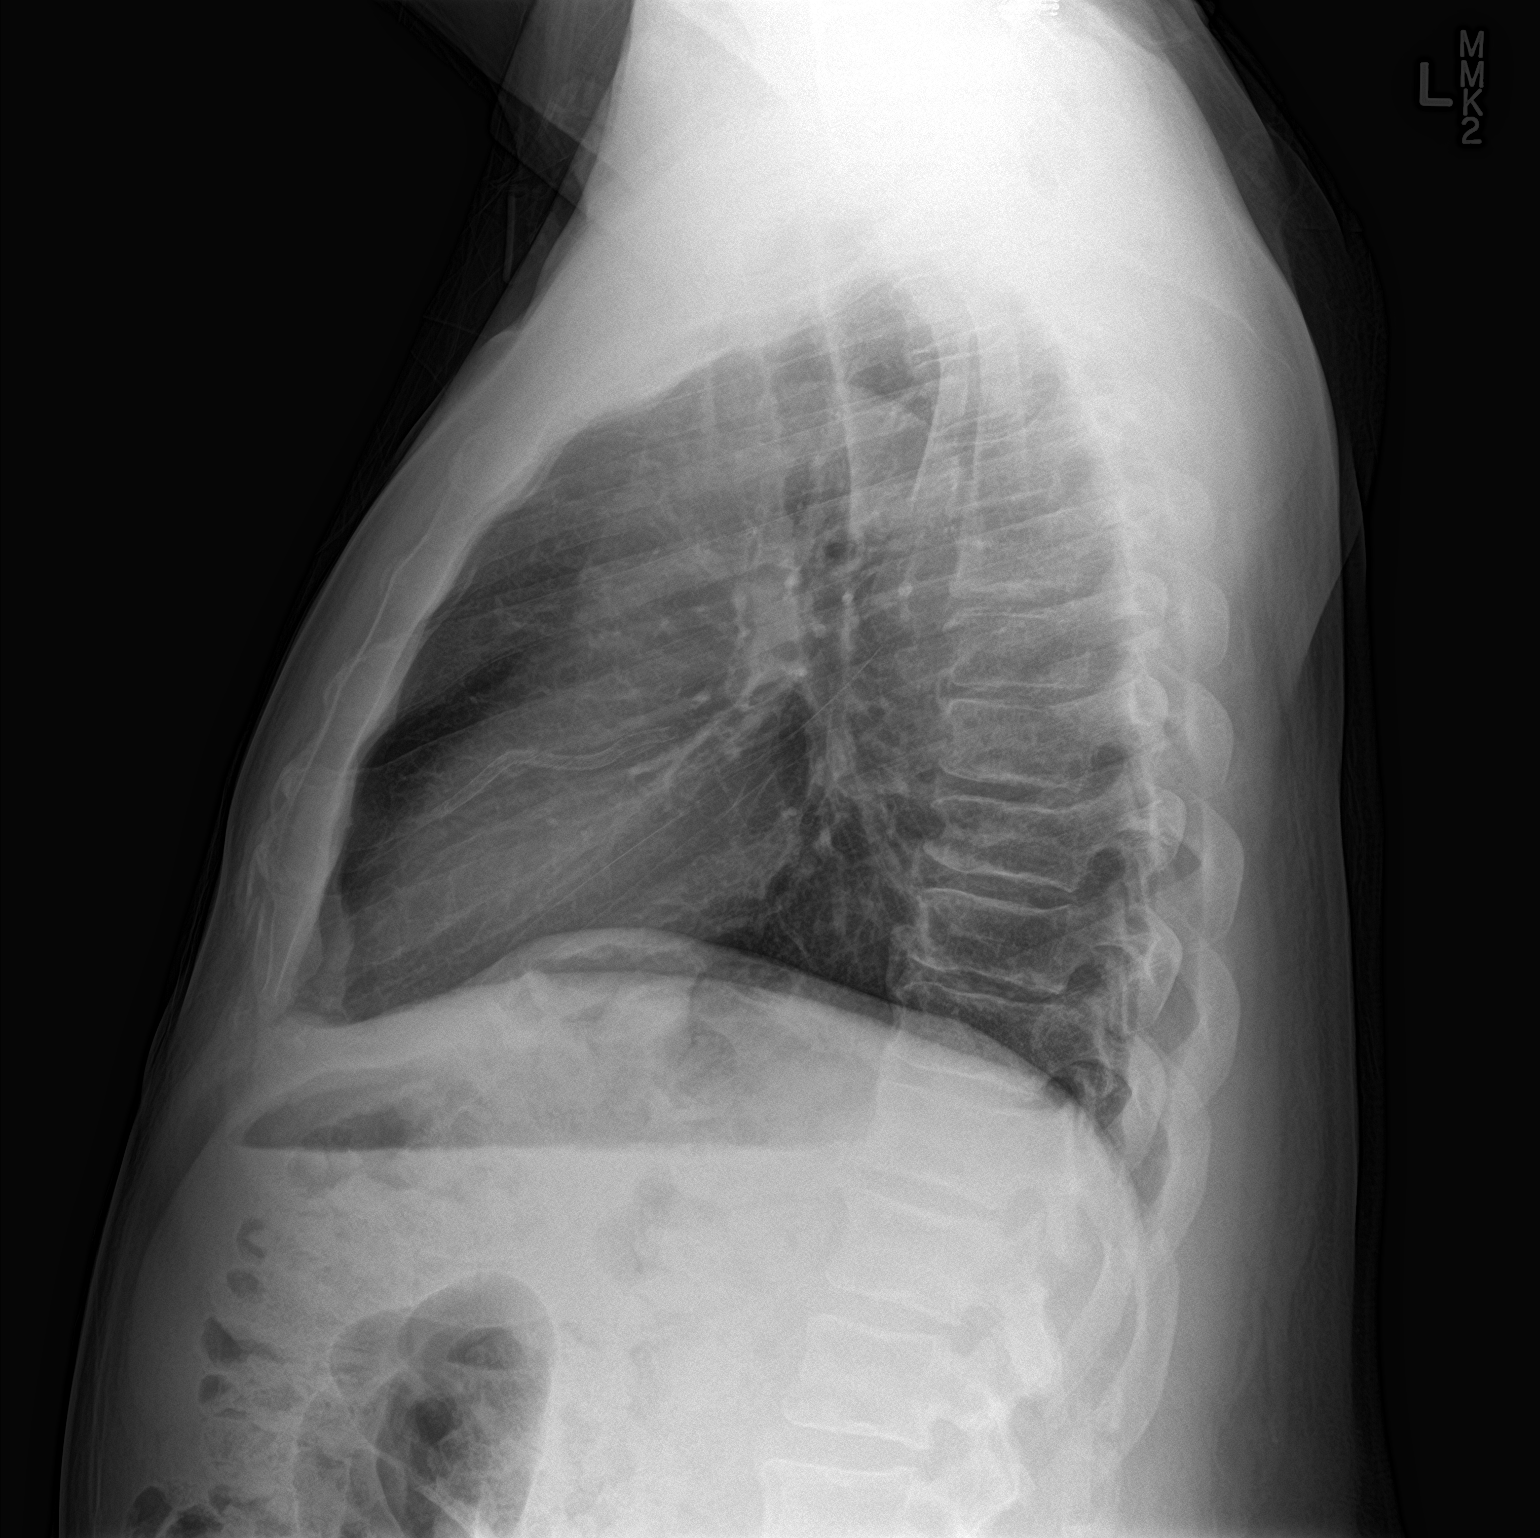

[2 of 2 positions shown; findings below may reference images not displayed]

FINDINGS: The cardiomediastinal silhouette is within normal limits. The lungs
are clear. No pleural effusion or pneumothorax is identified.
Coronary artery stenting and cervical spine fusion are noted. No
acute osseous abnormality is seen.
IMPRESSION: No active cardiopulmonary disease.

## 2018-01-25 IMAGING — CT CT HEAD W/O CM
5 of 8 series · 16 of 47 positions shown, 17 images · non-contrast
Comparison: 03/09/2016, 12/08/2015

CLINICAL DATA: Fall

EXAM:
CT HEAD WITHOUT CONTRAST
CT CERVICAL SPINE WITHOUT CONTRAST
TECHNIQUE: Multidetector CT imaging of the head and cervical spine was
performed following the standard protocol without intravenous
contrast. Multiplanar CT image reconstructions of the cervical spine
were also generated.

[Series 4: c_spine 2.0 st · axial · 0.35mm/px · z∈[-395,-375]mm · 2 of 91 slices shown]
[im 11/91  brain]
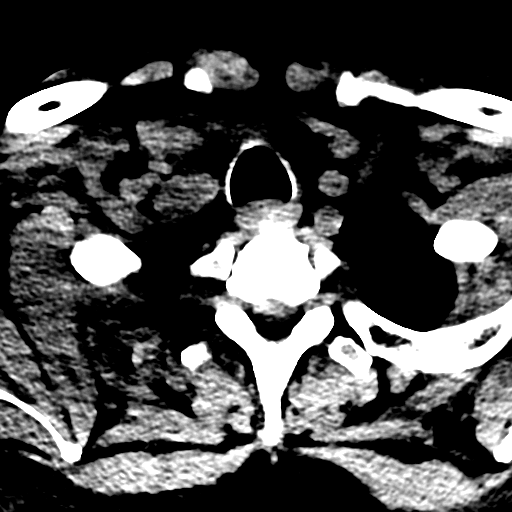
[im 21/91  brain]
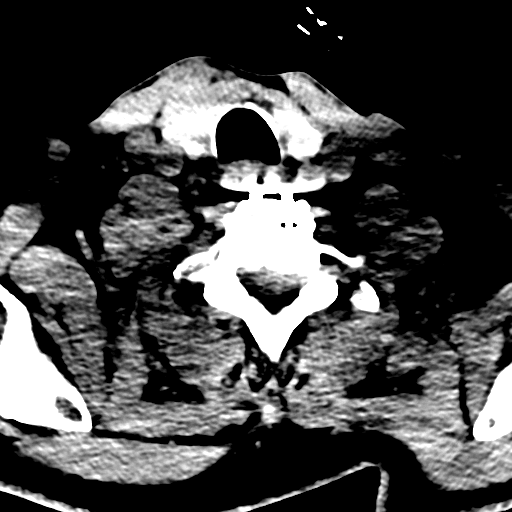

[Series 5: head without · axial · non-contrast · 0.49mm/px · z∈[-250,-95]mm · 3 of 32 slices shown, 4 images]
[im 1/32  brain]
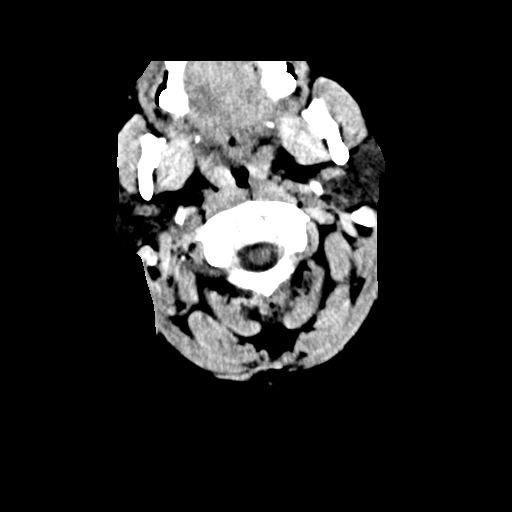
[im 1/32  bone]
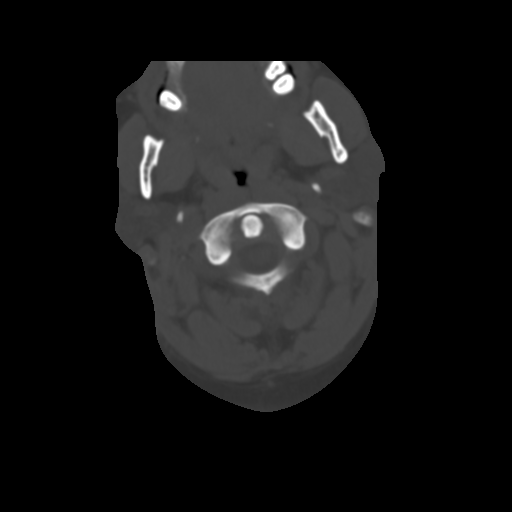
[im 16/32  brain]
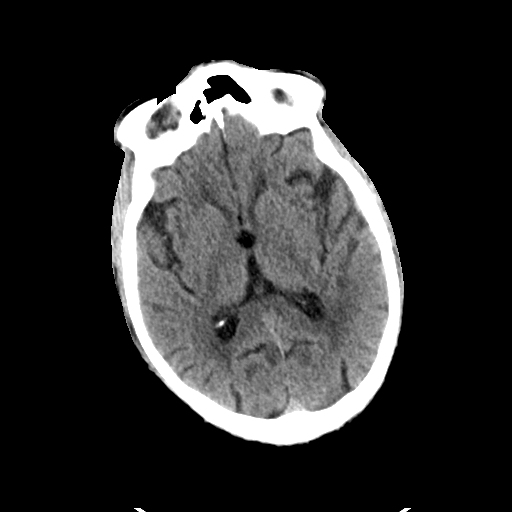
[im 32/32  brain]
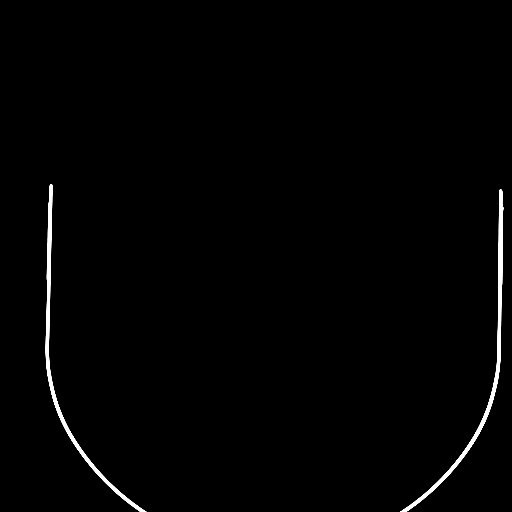

[Series 6: head bone · axial · 0.49mm/px · z∈[-228,-118]mm · 6 of 78 slices shown]
[im 12/78  bone]
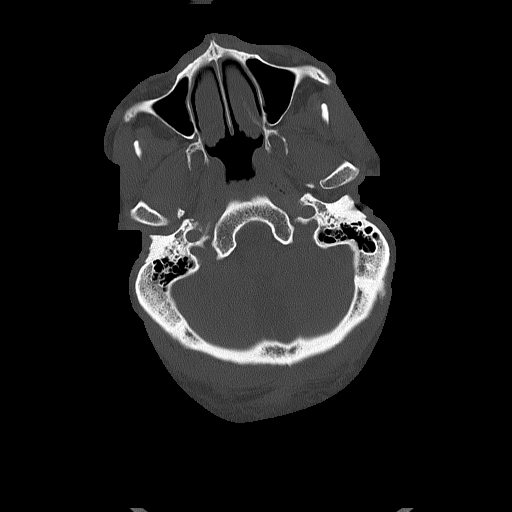
[im 23/78  bone]
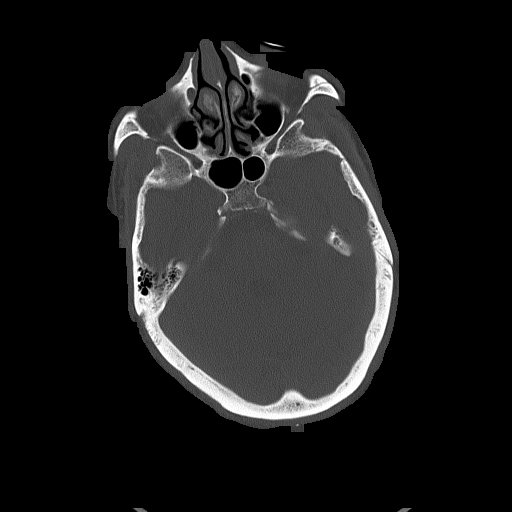
[im 34/78  bone]
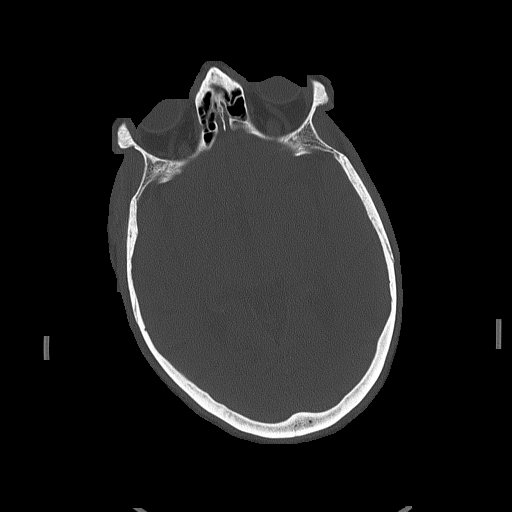
[im 45/78  bone]
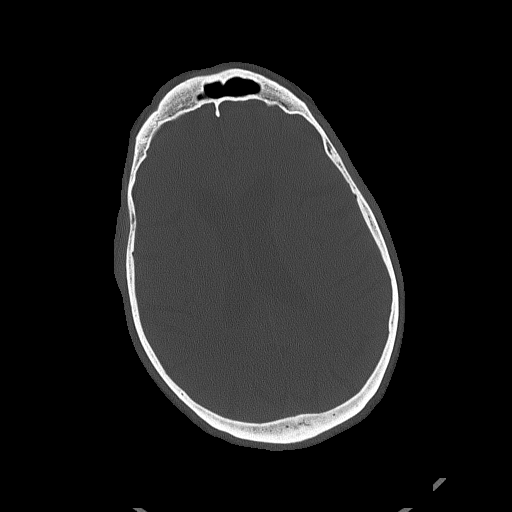
[im 56/78  bone]
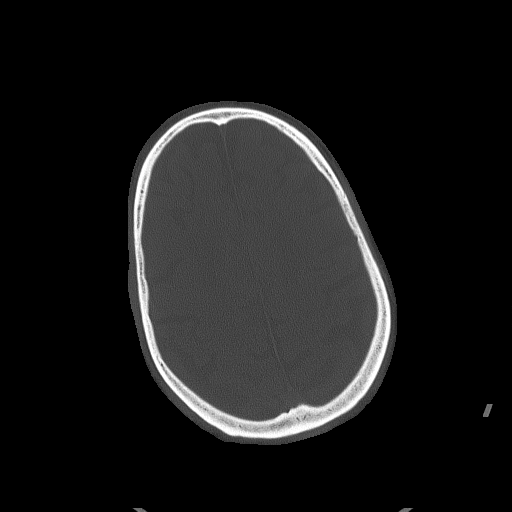
[im 67/78  bone]
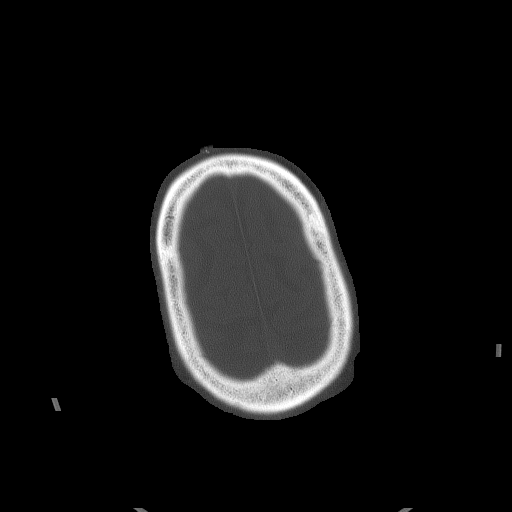

[Series 7: head without cor · coronal · non-contrast · 0.31mm/px · 3 of 78 slices shown]
[im 26/78  brain]
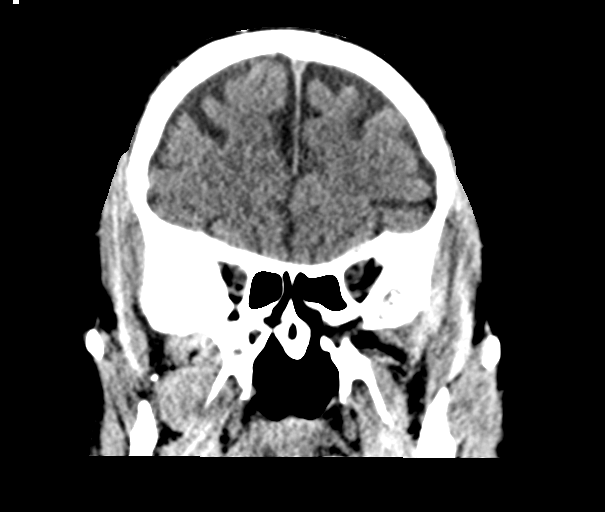
[im 39/78  brain]
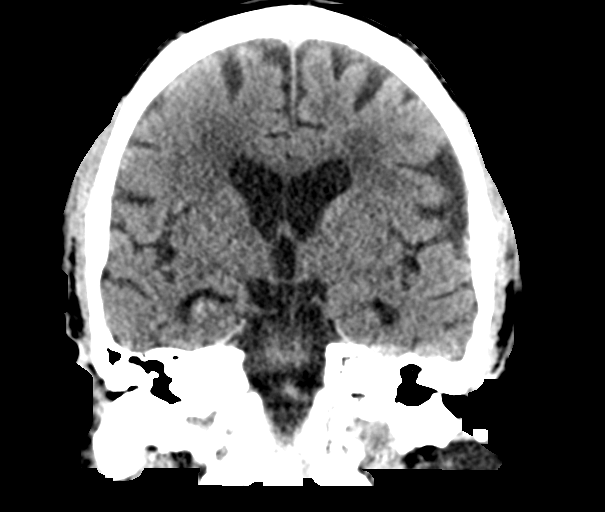
[im 52/78  brain]
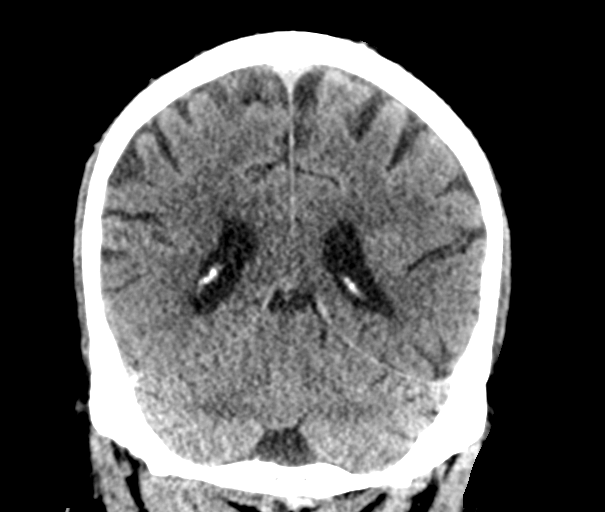

[Series 8: head without sag · sagittal · non-contrast · 0.33mm/px · 2 of 67 slices shown]
[im 23/67  brain]
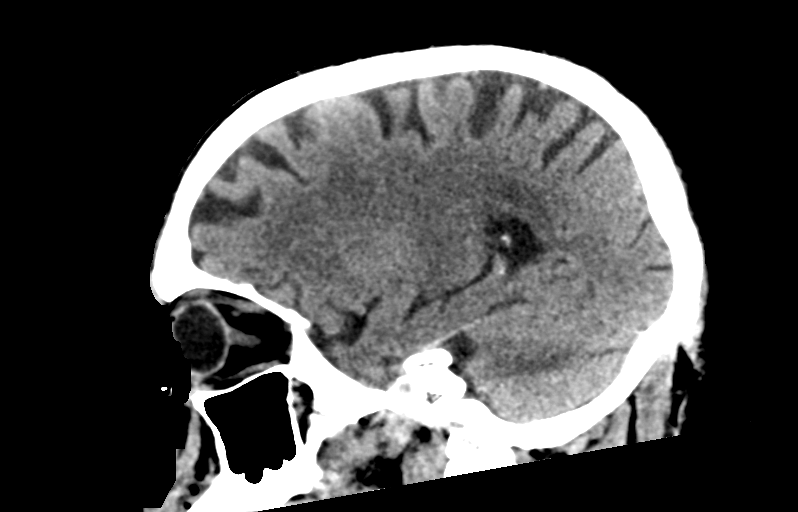
[im 45/67  brain]
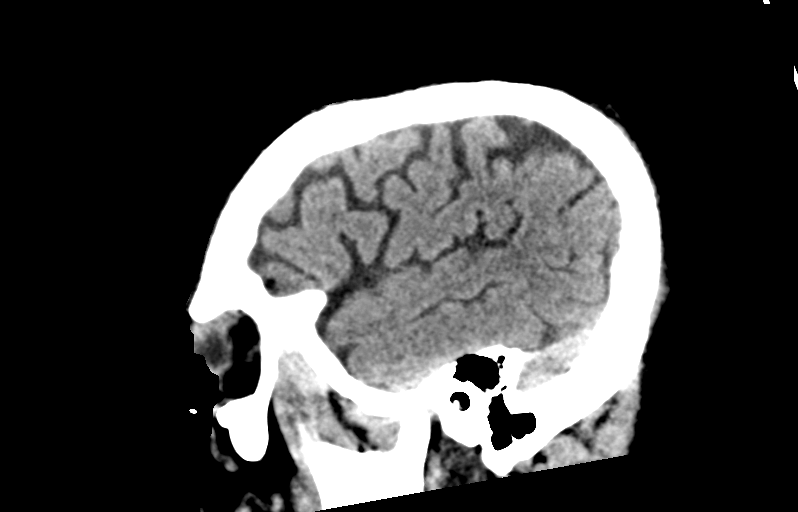

[16 of 47 positions shown; findings below may reference images not displayed]

FINDINGS: CT HEAD FINDINGS

Brain: No acute territorial infarction, intracranial hemorrhage, or
focal mass lesion is visualized. Mild to moderate atrophy. Moderate
periventricular white matter small vessel ischemic changes.
Ventricles are stable in size. Stable small midline lipoma

Vascular: No hyperdense vessels. Scattered calcifications at the
carotid siphons.

Skull: Normal. Negative for fracture or focal lesion.

Sinuses/Orbits: Mild mucosal thickening in the ethmoid and maxillary
sinuses. No acute orbital abnormality.

Other: None

CT CERVICAL SPINE FINDINGS

Alignment: Straightening.  Facet alignment within normal limits.

Skull base and vertebrae: No acute fracture. No primary bone lesion
or focal pathologic process.

Soft tissues and spinal canal: No prevertebral fluid or swelling. No
visible canal hematoma.

Disc levels: Surgical plate and screw fixation from C3 through C7.
Solid bony fusion at C4-C5. Interbody devices at C3-C4, C5-C6, and
C6-C7. Mild diffuse canal stenosis from C3 through C7. Posterior
facet arthropathy at multiple levels.

Upper chest: Grossly clear lung apices. Thyroid within normal
limits.

Other: None
IMPRESSION: 1. No definite CT evidence for acute intracranial abnormality.
Atrophy and white matter small vessel ischemic changes
2. Straightening of the cervical spine. Patient is status post
anterior surgical plate and screw fixation C3 through C7. No acute
fracture or malalignment.

## 2018-01-25 IMAGING — DX DG KNEE 1-2V*R*
2 series · 2 of 2 positions shown · non-contrast
Comparison: None.

CLINICAL DATA: Pain after fall.

EXAM:
RIGHT KNEE - 1-2 VIEW

[knee ap]
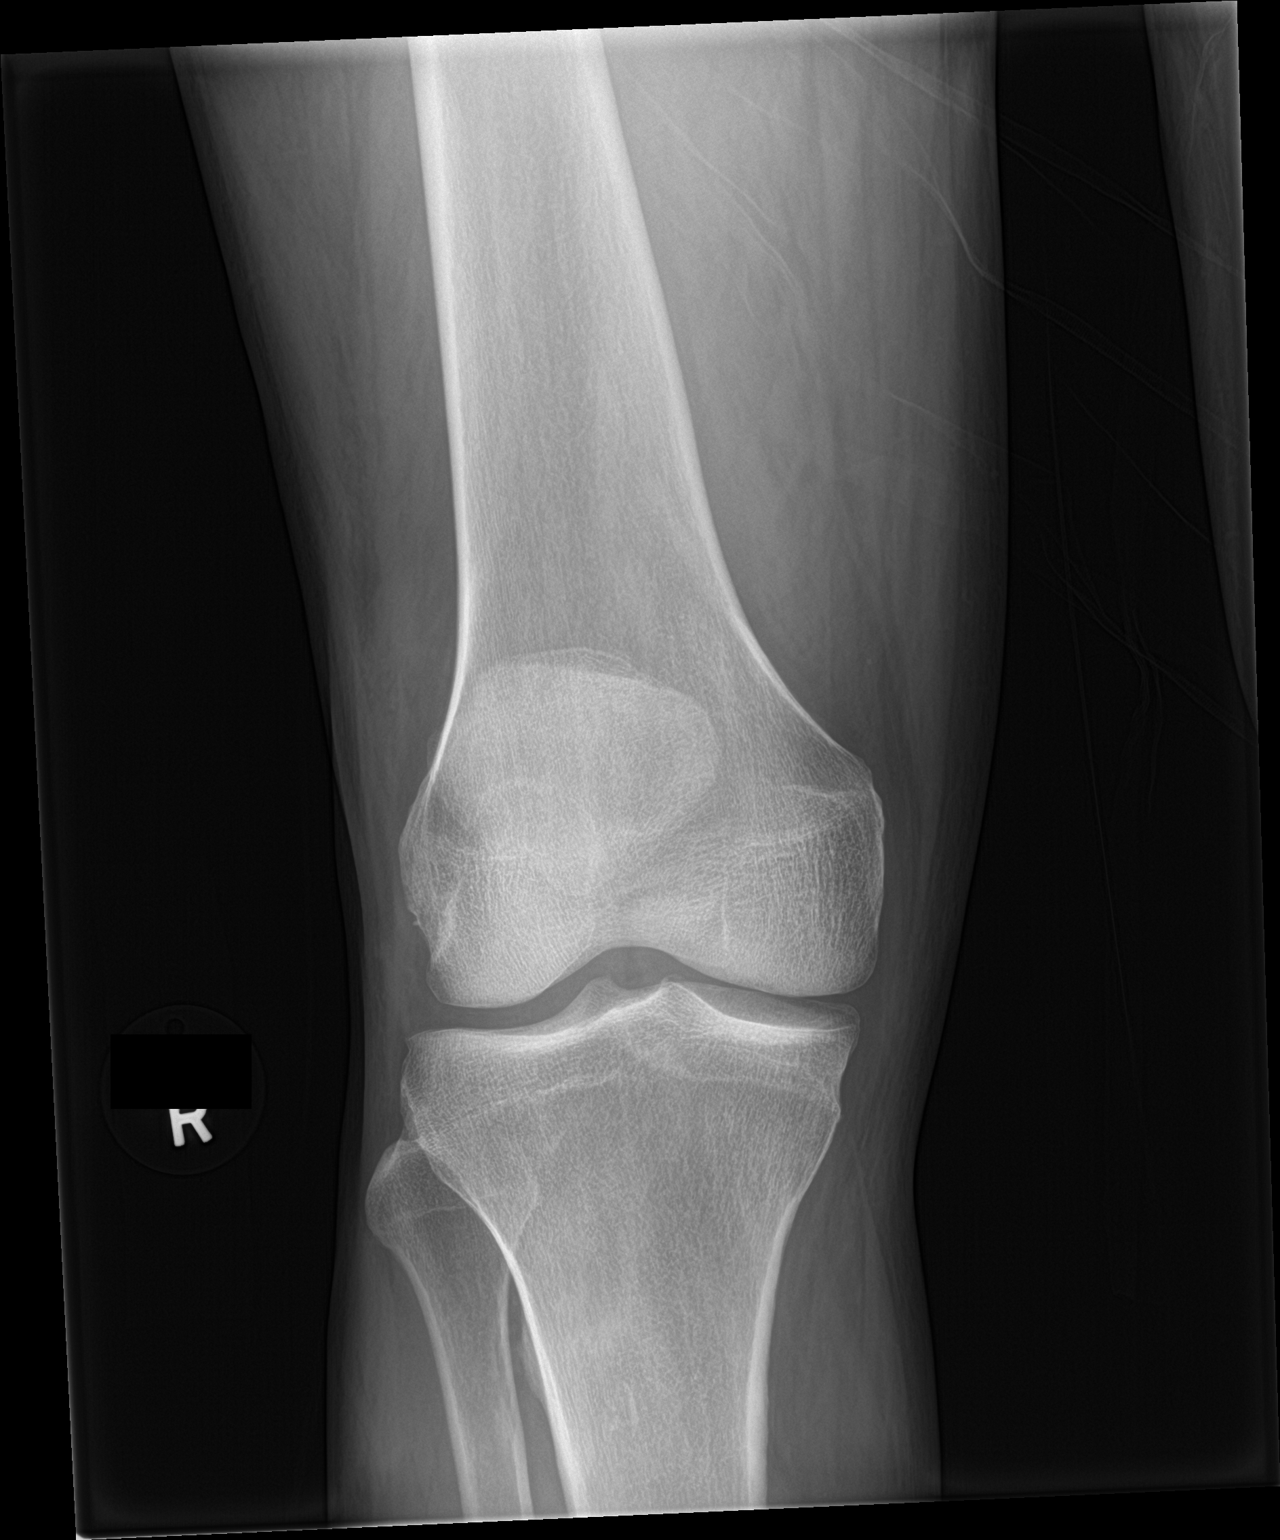

[knee lat]
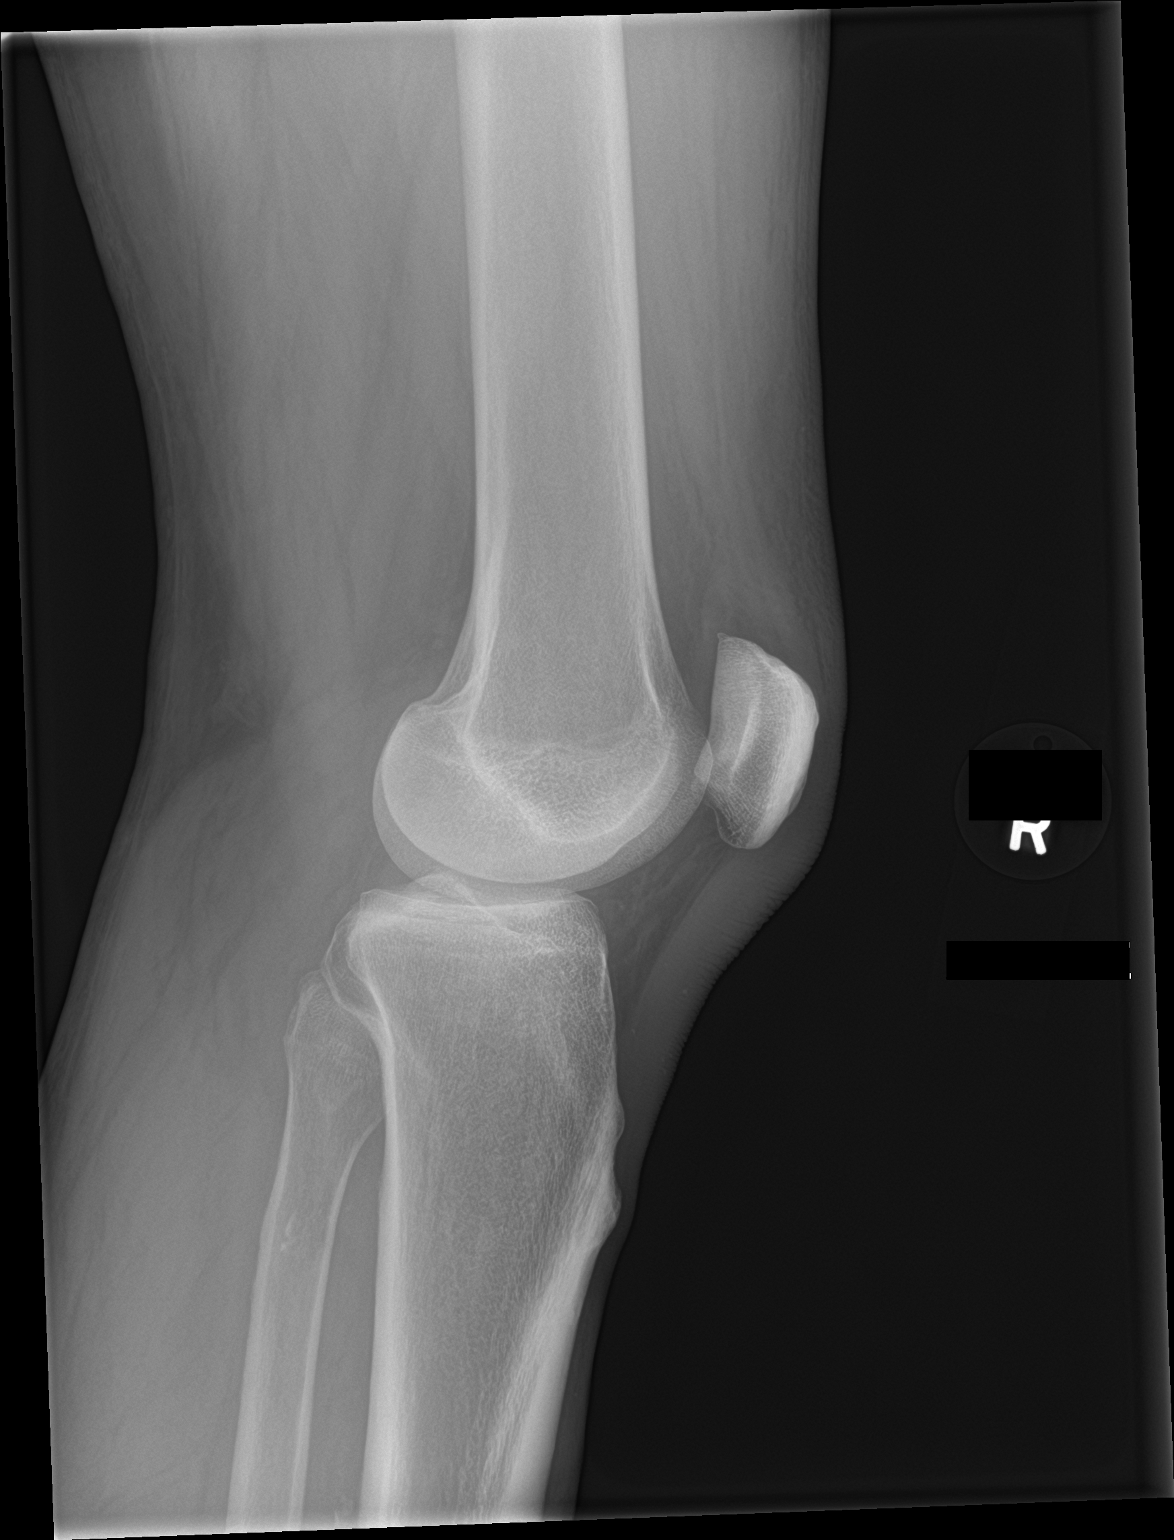

[2 of 2 positions shown; findings below may reference images not displayed]

FINDINGS: No evidence of fracture, dislocation, or joint effusion. No evidence
of arthropathy or other focal bone abnormality. Soft tissues are
unremarkable.
IMPRESSION: Negative.

## 2018-01-26 DIAGNOSIS — Z9889 Other specified postprocedural states: Secondary | ICD-10-CM | POA: Diagnosis not present

## 2018-01-26 DIAGNOSIS — J9811 Atelectasis: Secondary | ICD-10-CM | POA: Diagnosis not present

## 2018-01-26 DIAGNOSIS — R339 Retention of urine, unspecified: Secondary | ICD-10-CM | POA: Diagnosis not present

## 2018-01-26 DIAGNOSIS — Z978 Presence of other specified devices: Secondary | ICD-10-CM | POA: Diagnosis not present

## 2018-01-26 DIAGNOSIS — I639 Cerebral infarction, unspecified: Secondary | ICD-10-CM | POA: Diagnosis not present

## 2018-01-26 DIAGNOSIS — Z01818 Encounter for other preprocedural examination: Secondary | ICD-10-CM | POA: Diagnosis not present

## 2018-01-26 DIAGNOSIS — E1110 Type 2 diabetes mellitus with ketoacidosis without coma: Secondary | ICD-10-CM | POA: Diagnosis not present

## 2018-01-26 DIAGNOSIS — M109 Gout, unspecified: Secondary | ICD-10-CM | POA: Diagnosis not present

## 2018-01-26 DIAGNOSIS — J449 Chronic obstructive pulmonary disease, unspecified: Secondary | ICD-10-CM | POA: Diagnosis not present

## 2018-01-26 DIAGNOSIS — I119 Hypertensive heart disease without heart failure: Secondary | ICD-10-CM | POA: Diagnosis not present

## 2018-01-26 DIAGNOSIS — N179 Acute kidney failure, unspecified: Secondary | ICD-10-CM | POA: Diagnosis not present

## 2018-01-26 DIAGNOSIS — N400 Enlarged prostate without lower urinary tract symptoms: Secondary | ICD-10-CM | POA: Diagnosis not present

## 2018-01-26 DIAGNOSIS — I25110 Atherosclerotic heart disease of native coronary artery with unstable angina pectoris: Secondary | ICD-10-CM | POA: Diagnosis not present

## 2018-01-26 DIAGNOSIS — J45909 Unspecified asthma, uncomplicated: Secondary | ICD-10-CM | POA: Diagnosis not present

## 2018-01-26 DIAGNOSIS — J9691 Respiratory failure, unspecified with hypoxia: Secondary | ICD-10-CM | POA: Diagnosis not present

## 2018-01-26 DIAGNOSIS — R9431 Abnormal electrocardiogram [ECG] [EKG]: Secondary | ICD-10-CM | POA: Diagnosis not present

## 2018-01-26 DIAGNOSIS — E1165 Type 2 diabetes mellitus with hyperglycemia: Secondary | ICD-10-CM | POA: Diagnosis not present

## 2018-01-26 DIAGNOSIS — I371 Nonrheumatic pulmonary valve insufficiency: Secondary | ICD-10-CM | POA: Diagnosis not present

## 2018-01-26 DIAGNOSIS — R05 Cough: Secondary | ICD-10-CM | POA: Diagnosis not present

## 2018-01-26 DIAGNOSIS — I480 Paroxysmal atrial fibrillation: Secondary | ICD-10-CM | POA: Diagnosis not present

## 2018-01-26 DIAGNOSIS — Z951 Presence of aortocoronary bypass graft: Secondary | ICD-10-CM | POA: Diagnosis not present

## 2018-01-26 DIAGNOSIS — I252 Old myocardial infarction: Secondary | ICD-10-CM | POA: Diagnosis not present

## 2018-01-26 DIAGNOSIS — R0789 Other chest pain: Secondary | ICD-10-CM | POA: Diagnosis not present

## 2018-01-26 DIAGNOSIS — R279 Unspecified lack of coordination: Secondary | ICD-10-CM | POA: Diagnosis not present

## 2018-01-26 DIAGNOSIS — R062 Wheezing: Secondary | ICD-10-CM | POA: Diagnosis not present

## 2018-01-26 DIAGNOSIS — J9589 Other postprocedural complications and disorders of respiratory system, not elsewhere classified: Secondary | ICD-10-CM | POA: Diagnosis not present

## 2018-01-26 DIAGNOSIS — Z794 Long term (current) use of insulin: Secondary | ICD-10-CM | POA: Diagnosis not present

## 2018-01-26 DIAGNOSIS — Z9119 Patient's noncompliance with other medical treatment and regimen: Secondary | ICD-10-CM | POA: Diagnosis not present

## 2018-01-26 DIAGNOSIS — Z955 Presence of coronary angioplasty implant and graft: Secondary | ICD-10-CM | POA: Diagnosis not present

## 2018-01-26 DIAGNOSIS — I2510 Atherosclerotic heart disease of native coronary artery without angina pectoris: Secondary | ICD-10-CM | POA: Diagnosis not present

## 2018-01-26 DIAGNOSIS — I959 Hypotension, unspecified: Secondary | ICD-10-CM | POA: Diagnosis not present

## 2018-01-26 DIAGNOSIS — G9389 Other specified disorders of brain: Secondary | ICD-10-CM | POA: Diagnosis not present

## 2018-01-26 DIAGNOSIS — Z9114 Patient's other noncompliance with medication regimen: Secondary | ICD-10-CM | POA: Diagnosis not present

## 2018-01-26 DIAGNOSIS — I361 Nonrheumatic tricuspid (valve) insufficiency: Secondary | ICD-10-CM | POA: Diagnosis not present

## 2018-01-26 DIAGNOSIS — Z743 Need for continuous supervision: Secondary | ICD-10-CM | POA: Diagnosis not present

## 2018-01-26 DIAGNOSIS — Z7984 Long term (current) use of oral hypoglycemic drugs: Secondary | ICD-10-CM | POA: Diagnosis not present

## 2018-01-26 DIAGNOSIS — M6281 Muscle weakness (generalized): Secondary | ICD-10-CM | POA: Diagnosis not present

## 2018-01-26 DIAGNOSIS — I213 ST elevation (STEMI) myocardial infarction of unspecified site: Secondary | ICD-10-CM | POA: Diagnosis not present

## 2018-01-26 DIAGNOSIS — R0602 Shortness of breath: Secondary | ICD-10-CM | POA: Diagnosis not present

## 2018-01-26 DIAGNOSIS — Z981 Arthrodesis status: Secondary | ICD-10-CM | POA: Diagnosis not present

## 2018-01-26 DIAGNOSIS — Z86711 Personal history of pulmonary embolism: Secondary | ICD-10-CM | POA: Diagnosis not present

## 2018-01-26 DIAGNOSIS — Z7982 Long term (current) use of aspirin: Secondary | ICD-10-CM | POA: Diagnosis not present

## 2018-01-26 DIAGNOSIS — D649 Anemia, unspecified: Secondary | ICD-10-CM | POA: Diagnosis not present

## 2018-01-26 DIAGNOSIS — I10 Essential (primary) hypertension: Secondary | ICD-10-CM | POA: Diagnosis not present

## 2018-01-26 DIAGNOSIS — Z5189 Encounter for other specified aftercare: Secondary | ICD-10-CM | POA: Diagnosis not present

## 2018-01-26 DIAGNOSIS — R0989 Other specified symptoms and signs involving the circulatory and respiratory systems: Secondary | ICD-10-CM | POA: Diagnosis not present

## 2018-01-26 DIAGNOSIS — K219 Gastro-esophageal reflux disease without esophagitis: Secondary | ICD-10-CM | POA: Diagnosis not present

## 2018-01-26 DIAGNOSIS — R079 Chest pain, unspecified: Secondary | ICD-10-CM | POA: Diagnosis not present

## 2018-01-26 DIAGNOSIS — J90 Pleural effusion, not elsewhere classified: Secondary | ICD-10-CM | POA: Diagnosis not present

## 2018-01-26 DIAGNOSIS — R0689 Other abnormalities of breathing: Secondary | ICD-10-CM | POA: Diagnosis not present

## 2018-01-26 DIAGNOSIS — D62 Acute posthemorrhagic anemia: Secondary | ICD-10-CM | POA: Diagnosis not present

## 2018-01-26 DIAGNOSIS — R918 Other nonspecific abnormal finding of lung field: Secondary | ICD-10-CM | POA: Diagnosis not present

## 2018-01-26 DIAGNOSIS — I481 Persistent atrial fibrillation: Secondary | ICD-10-CM | POA: Diagnosis not present

## 2018-01-26 DIAGNOSIS — E785 Hyperlipidemia, unspecified: Secondary | ICD-10-CM | POA: Diagnosis not present

## 2018-01-26 DIAGNOSIS — I420 Dilated cardiomyopathy: Secondary | ICD-10-CM | POA: Diagnosis not present

## 2018-01-26 DIAGNOSIS — I482 Chronic atrial fibrillation: Secondary | ICD-10-CM | POA: Diagnosis not present

## 2018-01-26 DIAGNOSIS — I4891 Unspecified atrial fibrillation: Secondary | ICD-10-CM | POA: Diagnosis not present

## 2018-01-28 DIAGNOSIS — I2510 Atherosclerotic heart disease of native coronary artery without angina pectoris: Secondary | ICD-10-CM | POA: Diagnosis not present

## 2018-01-28 DIAGNOSIS — E1165 Type 2 diabetes mellitus with hyperglycemia: Secondary | ICD-10-CM | POA: Diagnosis not present

## 2018-01-28 DIAGNOSIS — R079 Chest pain, unspecified: Secondary | ICD-10-CM | POA: Diagnosis not present

## 2018-01-28 DIAGNOSIS — R0602 Shortness of breath: Secondary | ICD-10-CM | POA: Diagnosis not present

## 2018-01-28 DIAGNOSIS — R05 Cough: Secondary | ICD-10-CM | POA: Diagnosis not present

## 2018-01-28 DIAGNOSIS — D649 Anemia, unspecified: Secondary | ICD-10-CM | POA: Diagnosis not present

## 2018-01-28 DIAGNOSIS — Z794 Long term (current) use of insulin: Secondary | ICD-10-CM | POA: Diagnosis not present

## 2018-01-28 DIAGNOSIS — Z9889 Other specified postprocedural states: Secondary | ICD-10-CM | POA: Diagnosis not present

## 2018-01-28 DIAGNOSIS — R062 Wheezing: Secondary | ICD-10-CM | POA: Diagnosis not present

## 2018-01-28 DIAGNOSIS — R0789 Other chest pain: Secondary | ICD-10-CM | POA: Diagnosis not present

## 2018-01-28 IMAGING — DX DG CHEST 2V
2 series · 2 of 2 positions shown · non-contrast
Comparison: PA and lateral chest x-ray December 16, 2016

CLINICAL DATA: One month of cough and left-sided chest pain.
Nonsmoker. History of hypertension, HIV, diabetes, hyperlipidemia,
asthma, coronary artery disease

EXAM:
CHEST  2 VIEW

[chest pa]
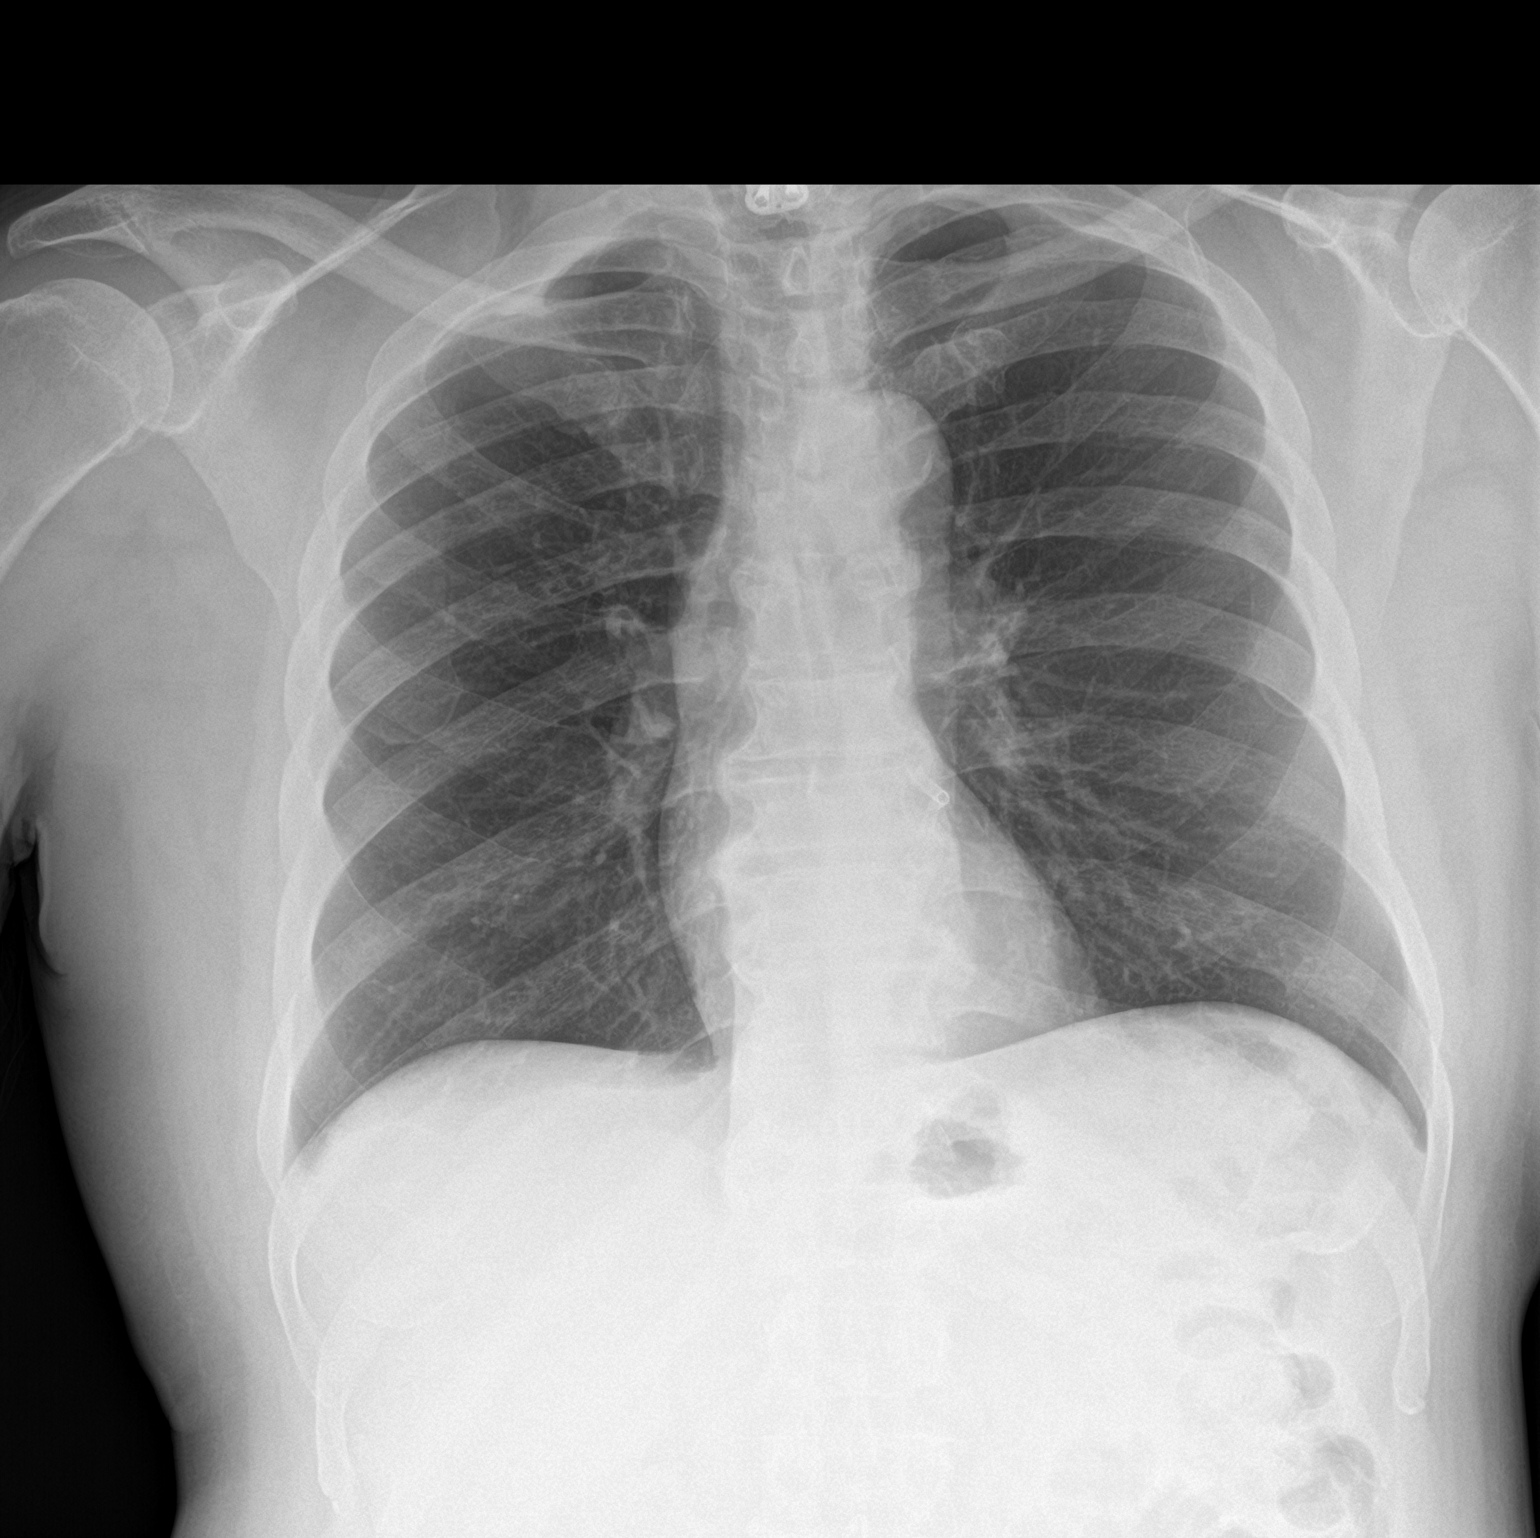

[chest lat]
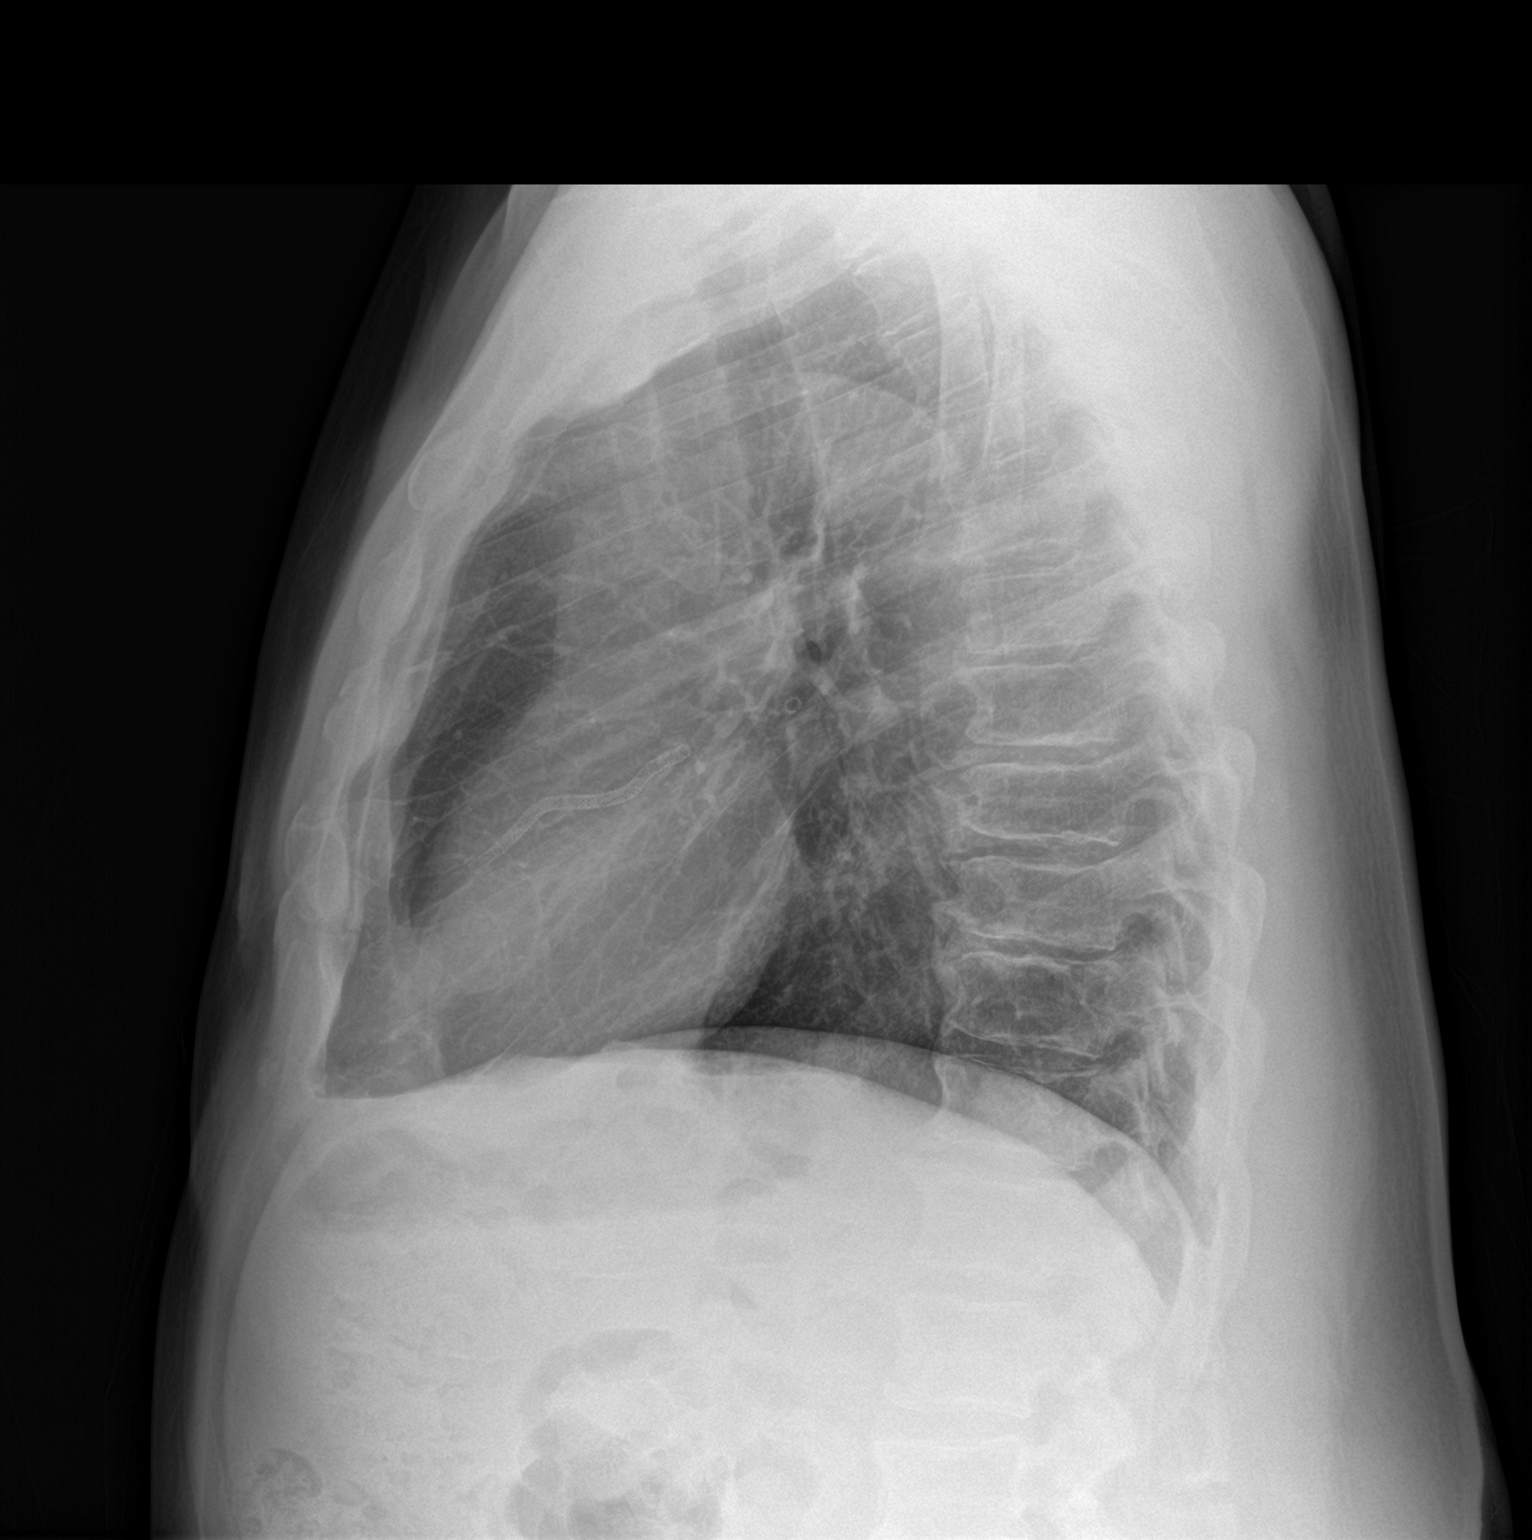

[2 of 2 positions shown; findings below may reference images not displayed]

FINDINGS: The lungs are adequately inflated and clear. The heart and pulmonary
vascularity are normal. The mediastinum is normal in width. There is
no pleural effusion. There is calcification in the wall of the
aortic arch. And coronary artery stent graft is present. The
observed bony thorax exhibits no acute abnormality.
IMPRESSION: There is no pneumonia, CHF, nor other acute cardiopulmonary
abnormality.

Known coronary artery disease.  Thoracic aortic atherosclerosis.

## 2018-01-29 DIAGNOSIS — R9431 Abnormal electrocardiogram [ECG] [EKG]: Secondary | ICD-10-CM | POA: Diagnosis not present

## 2018-01-29 DIAGNOSIS — R079 Chest pain, unspecified: Secondary | ICD-10-CM | POA: Diagnosis not present

## 2018-01-29 DIAGNOSIS — R05 Cough: Secondary | ICD-10-CM | POA: Diagnosis not present

## 2018-01-29 DIAGNOSIS — E1110 Type 2 diabetes mellitus with ketoacidosis without coma: Secondary | ICD-10-CM | POA: Diagnosis not present

## 2018-01-29 DIAGNOSIS — Z9119 Patient's noncompliance with other medical treatment and regimen: Secondary | ICD-10-CM | POA: Diagnosis not present

## 2018-01-29 DIAGNOSIS — Z794 Long term (current) use of insulin: Secondary | ICD-10-CM | POA: Diagnosis not present

## 2018-02-07 DIAGNOSIS — Z955 Presence of coronary angioplasty implant and graft: Secondary | ICD-10-CM | POA: Diagnosis not present

## 2018-02-07 DIAGNOSIS — R52 Pain, unspecified: Secondary | ICD-10-CM | POA: Diagnosis not present

## 2018-02-07 DIAGNOSIS — I4891 Unspecified atrial fibrillation: Secondary | ICD-10-CM | POA: Diagnosis not present

## 2018-02-07 DIAGNOSIS — M542 Cervicalgia: Secondary | ICD-10-CM | POA: Diagnosis not present

## 2018-02-07 DIAGNOSIS — M6281 Muscle weakness (generalized): Secondary | ICD-10-CM | POA: Diagnosis not present

## 2018-02-07 DIAGNOSIS — R279 Unspecified lack of coordination: Secondary | ICD-10-CM | POA: Diagnosis not present

## 2018-02-07 DIAGNOSIS — M25511 Pain in right shoulder: Secondary | ICD-10-CM | POA: Diagnosis not present

## 2018-02-07 DIAGNOSIS — J189 Pneumonia, unspecified organism: Secondary | ICD-10-CM | POA: Diagnosis not present

## 2018-02-07 DIAGNOSIS — F418 Other specified anxiety disorders: Secondary | ICD-10-CM | POA: Diagnosis present

## 2018-02-07 DIAGNOSIS — N183 Chronic kidney disease, stage 3 (moderate): Secondary | ICD-10-CM | POA: Diagnosis not present

## 2018-02-07 DIAGNOSIS — Z833 Family history of diabetes mellitus: Secondary | ICD-10-CM | POA: Diagnosis not present

## 2018-02-07 DIAGNOSIS — F333 Major depressive disorder, recurrent, severe with psychotic symptoms: Secondary | ICD-10-CM | POA: Diagnosis present

## 2018-02-07 DIAGNOSIS — I129 Hypertensive chronic kidney disease with stage 1 through stage 4 chronic kidney disease, or unspecified chronic kidney disease: Secondary | ICD-10-CM | POA: Diagnosis not present

## 2018-02-07 DIAGNOSIS — E1140 Type 2 diabetes mellitus with diabetic neuropathy, unspecified: Secondary | ICD-10-CM | POA: Diagnosis not present

## 2018-02-07 DIAGNOSIS — R0602 Shortness of breath: Secondary | ICD-10-CM | POA: Diagnosis not present

## 2018-02-07 DIAGNOSIS — I213 ST elevation (STEMI) myocardial infarction of unspecified site: Secondary | ICD-10-CM | POA: Diagnosis not present

## 2018-02-07 DIAGNOSIS — I2510 Atherosclerotic heart disease of native coronary artery without angina pectoris: Secondary | ICD-10-CM | POA: Diagnosis not present

## 2018-02-07 DIAGNOSIS — Z951 Presence of aortocoronary bypass graft: Secondary | ICD-10-CM | POA: Diagnosis not present

## 2018-02-07 DIAGNOSIS — E1122 Type 2 diabetes mellitus with diabetic chronic kidney disease: Secondary | ICD-10-CM | POA: Diagnosis not present

## 2018-02-07 DIAGNOSIS — B20 Human immunodeficiency virus [HIV] disease: Secondary | ICD-10-CM | POA: Diagnosis present

## 2018-02-07 DIAGNOSIS — Z5189 Encounter for other specified aftercare: Secondary | ICD-10-CM | POA: Diagnosis not present

## 2018-02-07 DIAGNOSIS — E440 Moderate protein-calorie malnutrition: Secondary | ICD-10-CM | POA: Diagnosis not present

## 2018-02-07 DIAGNOSIS — J449 Chronic obstructive pulmonary disease, unspecified: Secondary | ICD-10-CM | POA: Diagnosis not present

## 2018-02-07 DIAGNOSIS — Z8249 Family history of ischemic heart disease and other diseases of the circulatory system: Secondary | ICD-10-CM | POA: Diagnosis not present

## 2018-02-07 DIAGNOSIS — I10 Essential (primary) hypertension: Secondary | ICD-10-CM | POA: Diagnosis not present

## 2018-02-07 DIAGNOSIS — Z21 Asymptomatic human immunodeficiency virus [HIV] infection status: Secondary | ICD-10-CM | POA: Diagnosis not present

## 2018-02-07 DIAGNOSIS — Z7982 Long term (current) use of aspirin: Secondary | ICD-10-CM | POA: Diagnosis not present

## 2018-02-07 DIAGNOSIS — T148XXA Other injury of unspecified body region, initial encounter: Secondary | ICD-10-CM | POA: Diagnosis not present

## 2018-02-07 DIAGNOSIS — Z743 Need for continuous supervision: Secondary | ICD-10-CM | POA: Diagnosis not present

## 2018-02-07 DIAGNOSIS — F1421 Cocaine dependence, in remission: Secondary | ICD-10-CM | POA: Diagnosis present

## 2018-02-07 DIAGNOSIS — Z794 Long term (current) use of insulin: Secondary | ICD-10-CM | POA: Diagnosis not present

## 2018-02-07 DIAGNOSIS — Z86711 Personal history of pulmonary embolism: Secondary | ICD-10-CM | POA: Diagnosis not present

## 2018-02-07 DIAGNOSIS — N179 Acute kidney failure, unspecified: Secondary | ICD-10-CM | POA: Diagnosis not present

## 2018-02-07 DIAGNOSIS — E139 Other specified diabetes mellitus without complications: Secondary | ICD-10-CM | POA: Diagnosis not present

## 2018-02-07 DIAGNOSIS — E1165 Type 2 diabetes mellitus with hyperglycemia: Secondary | ICD-10-CM | POA: Diagnosis not present

## 2018-02-07 DIAGNOSIS — I248 Other forms of acute ischemic heart disease: Secondary | ICD-10-CM | POA: Diagnosis not present

## 2018-02-07 DIAGNOSIS — Y95 Nosocomial condition: Secondary | ICD-10-CM | POA: Diagnosis present

## 2018-02-07 DIAGNOSIS — Z79899 Other long term (current) drug therapy: Secondary | ICD-10-CM | POA: Diagnosis not present

## 2018-02-07 MED ORDER — CLOPIDOGREL BISULFATE 75 MG PO TABS
75.00 | ORAL_TABLET | ORAL | Status: DC
Start: 2018-02-08 — End: 2018-02-07

## 2018-02-07 MED ORDER — INSULIN LISPRO 100 UNIT/ML ~~LOC~~ SOLN
2.00 | SUBCUTANEOUS | Status: DC
Start: 2018-02-07 — End: 2018-02-07

## 2018-02-07 MED ORDER — POLYETHYLENE GLYCOL 3350 17 G PO PACK
17.00 g | PACK | ORAL | Status: DC
Start: ? — End: 2018-02-07

## 2018-02-07 MED ORDER — BISACODYL 10 MG RE SUPP
10.00 | RECTAL | Status: DC
Start: ? — End: 2018-02-07

## 2018-02-07 MED ORDER — DEXTROSE 10 % IV SOLN
125.00 | INTRAVENOUS | Status: DC
Start: ? — End: 2018-02-07

## 2018-02-07 MED ORDER — SENNOSIDES-DOCUSATE SODIUM 8.6-50 MG PO TABS
2.00 | ORAL_TABLET | ORAL | Status: DC
Start: 2018-02-07 — End: 2018-02-07

## 2018-02-07 MED ORDER — AMIODARONE HCL 200 MG PO TABS
200.00 | ORAL_TABLET | ORAL | Status: DC
Start: 2018-02-07 — End: 2018-02-07

## 2018-02-07 MED ORDER — ALBUTEROL SULFATE (2.5 MG/3ML) 0.083% IN NEBU
2.50 | INHALATION_SOLUTION | RESPIRATORY_TRACT | Status: DC
Start: ? — End: 2018-02-07

## 2018-02-07 MED ORDER — HYDRALAZINE HCL 20 MG/ML IJ SOLN
10.00 | INTRAMUSCULAR | Status: DC
Start: ? — End: 2018-02-07

## 2018-02-07 MED ORDER — TAMSULOSIN HCL 0.4 MG PO CAPS
0.40 | ORAL_CAPSULE | ORAL | Status: DC
Start: 2018-02-08 — End: 2018-02-07

## 2018-02-07 MED ORDER — SORBITOL 70 % RE SOLN
30.00 | RECTAL | Status: DC
Start: ? — End: 2018-02-07

## 2018-02-07 MED ORDER — METOPROLOL TARTRATE 25 MG PO TABS
25.00 | ORAL_TABLET | ORAL | Status: DC
Start: 2018-02-07 — End: 2018-02-07

## 2018-02-07 MED ORDER — ACETAMINOPHEN 500 MG PO TABS
1000.00 | ORAL_TABLET | ORAL | Status: DC
Start: ? — End: 2018-02-07

## 2018-02-07 MED ORDER — LOSARTAN POTASSIUM 25 MG PO TABS
25.00 | ORAL_TABLET | ORAL | Status: DC
Start: 2018-02-08 — End: 2018-02-07

## 2018-02-07 MED ORDER — ASPIRIN EC 81 MG PO TBEC
81.00 | DELAYED_RELEASE_TABLET | ORAL | Status: DC
Start: 2018-02-08 — End: 2018-02-07

## 2018-02-07 MED ORDER — ONDANSETRON HCL 4 MG/2ML IJ SOLN
4.00 | INTRAMUSCULAR | Status: DC
Start: ? — End: 2018-02-07

## 2018-02-07 MED ORDER — HEPARIN SODIUM (PORCINE) 5000 UNIT/ML IJ SOLN
5000.00 | INTRAMUSCULAR | Status: DC
Start: 2018-02-07 — End: 2018-02-07

## 2018-02-07 MED ORDER — FUROSEMIDE 20 MG PO TABS
20.00 | ORAL_TABLET | ORAL | Status: DC
Start: 2018-02-08 — End: 2018-02-07

## 2018-02-07 MED ORDER — DULOXETINE HCL 30 MG PO CPEP
30.00 | ORAL_CAPSULE | ORAL | Status: DC
Start: 2018-02-08 — End: 2018-02-07

## 2018-02-07 MED ORDER — RANITIDINE HCL 75 MG PO TABS
37.50 | ORAL_TABLET | ORAL | Status: DC
Start: 2018-02-07 — End: 2018-02-07

## 2018-02-07 MED ORDER — HYDRALAZINE HCL 10 MG PO TABS
10.00 | ORAL_TABLET | ORAL | Status: DC
Start: ? — End: 2018-02-07

## 2018-02-07 MED ORDER — EMTRICITAB-RILPIVIR-TENOFOV DF 200-25-300 MG PO TABS
1.00 | ORAL_TABLET | ORAL | Status: DC
Start: 2018-02-08 — End: 2018-02-07

## 2018-02-07 MED ORDER — ROSUVASTATIN CALCIUM 20 MG PO TABS
20.00 | ORAL_TABLET | ORAL | Status: DC
Start: 2018-02-07 — End: 2018-02-07

## 2018-02-07 MED ORDER — GABAPENTIN 100 MG PO CAPS
200.00 | ORAL_CAPSULE | ORAL | Status: DC
Start: 2018-02-07 — End: 2018-02-07

## 2018-02-10 DIAGNOSIS — Z951 Presence of aortocoronary bypass graft: Secondary | ICD-10-CM | POA: Diagnosis not present

## 2018-02-10 DIAGNOSIS — E139 Other specified diabetes mellitus without complications: Secondary | ICD-10-CM | POA: Diagnosis not present

## 2018-02-10 DIAGNOSIS — I10 Essential (primary) hypertension: Secondary | ICD-10-CM | POA: Diagnosis not present

## 2018-02-13 ENCOUNTER — Encounter (HOSPITAL_COMMUNITY): Payer: Self-pay | Admitting: Emergency Medicine

## 2018-02-13 ENCOUNTER — Emergency Department (HOSPITAL_COMMUNITY): Payer: Medicare Other

## 2018-02-13 ENCOUNTER — Other Ambulatory Visit: Payer: Self-pay

## 2018-02-13 ENCOUNTER — Inpatient Hospital Stay (HOSPITAL_COMMUNITY)
Admission: EM | Admit: 2018-02-13 | Discharge: 2018-02-17 | DRG: 975 | Disposition: A | Discharge status: Skilled Nursing Facility | Payer: Medicare Other | Attending: Internal Medicine | Admitting: Internal Medicine

## 2018-02-13 DIAGNOSIS — Z86711 Personal history of pulmonary embolism: Secondary | ICD-10-CM

## 2018-02-13 DIAGNOSIS — E440 Moderate protein-calorie malnutrition: Secondary | ICD-10-CM | POA: Diagnosis present

## 2018-02-13 DIAGNOSIS — M542 Cervicalgia: Secondary | ICD-10-CM | POA: Diagnosis not present

## 2018-02-13 DIAGNOSIS — J189 Pneumonia, unspecified organism: Secondary | ICD-10-CM | POA: Diagnosis not present

## 2018-02-13 DIAGNOSIS — Z955 Presence of coronary angioplasty implant and graft: Secondary | ICD-10-CM

## 2018-02-13 DIAGNOSIS — E1140 Type 2 diabetes mellitus with diabetic neuropathy, unspecified: Secondary | ICD-10-CM | POA: Diagnosis not present

## 2018-02-13 DIAGNOSIS — I10 Essential (primary) hypertension: Secondary | ICD-10-CM | POA: Diagnosis not present

## 2018-02-13 DIAGNOSIS — N183 Chronic kidney disease, stage 3 unspecified: Secondary | ICD-10-CM | POA: Diagnosis present

## 2018-02-13 DIAGNOSIS — I248 Other forms of acute ischemic heart disease: Secondary | ICD-10-CM | POA: Diagnosis present

## 2018-02-13 DIAGNOSIS — R52 Pain, unspecified: Secondary | ICD-10-CM | POA: Diagnosis not present

## 2018-02-13 DIAGNOSIS — E1165 Type 2 diabetes mellitus with hyperglycemia: Secondary | ICD-10-CM | POA: Diagnosis not present

## 2018-02-13 DIAGNOSIS — T148XXA Other injury of unspecified body region, initial encounter: Secondary | ICD-10-CM | POA: Diagnosis not present

## 2018-02-13 DIAGNOSIS — R0602 Shortness of breath: Secondary | ICD-10-CM | POA: Diagnosis not present

## 2018-02-13 DIAGNOSIS — Z951 Presence of aortocoronary bypass graft: Secondary | ICD-10-CM

## 2018-02-13 DIAGNOSIS — N179 Acute kidney failure, unspecified: Secondary | ICD-10-CM | POA: Diagnosis not present

## 2018-02-13 DIAGNOSIS — Z79899 Other long term (current) drug therapy: Secondary | ICD-10-CM

## 2018-02-13 DIAGNOSIS — E1122 Type 2 diabetes mellitus with diabetic chronic kidney disease: Secondary | ICD-10-CM | POA: Diagnosis not present

## 2018-02-13 DIAGNOSIS — Z794 Long term (current) use of insulin: Secondary | ICD-10-CM

## 2018-02-13 DIAGNOSIS — R269 Unspecified abnormalities of gait and mobility: Secondary | ICD-10-CM | POA: Diagnosis present

## 2018-02-13 DIAGNOSIS — F1421 Cocaine dependence, in remission: Secondary | ICD-10-CM | POA: Diagnosis present

## 2018-02-13 DIAGNOSIS — I129 Hypertensive chronic kidney disease with stage 1 through stage 4 chronic kidney disease, or unspecified chronic kidney disease: Secondary | ICD-10-CM | POA: Diagnosis not present

## 2018-02-13 DIAGNOSIS — Z8249 Family history of ischemic heart disease and other diseases of the circulatory system: Secondary | ICD-10-CM

## 2018-02-13 DIAGNOSIS — I2510 Atherosclerotic heart disease of native coronary artery without angina pectoris: Secondary | ICD-10-CM | POA: Diagnosis not present

## 2018-02-13 DIAGNOSIS — Z833 Family history of diabetes mellitus: Secondary | ICD-10-CM

## 2018-02-13 DIAGNOSIS — N189 Chronic kidney disease, unspecified: Secondary | ICD-10-CM | POA: Diagnosis present

## 2018-02-13 DIAGNOSIS — M25511 Pain in right shoulder: Secondary | ICD-10-CM | POA: Diagnosis present

## 2018-02-13 DIAGNOSIS — I4891 Unspecified atrial fibrillation: Secondary | ICD-10-CM | POA: Diagnosis present

## 2018-02-13 DIAGNOSIS — Z7982 Long term (current) use of aspirin: Secondary | ICD-10-CM | POA: Diagnosis not present

## 2018-02-13 DIAGNOSIS — Y95 Nosocomial condition: Secondary | ICD-10-CM | POA: Diagnosis present

## 2018-02-13 DIAGNOSIS — Z21 Asymptomatic human immunodeficiency virus [HIV] infection status: Secondary | ICD-10-CM | POA: Diagnosis not present

## 2018-02-13 DIAGNOSIS — F333 Major depressive disorder, recurrent, severe with psychotic symptoms: Secondary | ICD-10-CM | POA: Diagnosis present

## 2018-02-13 DIAGNOSIS — B20 Human immunodeficiency virus [HIV] disease: Secondary | ICD-10-CM | POA: Diagnosis present

## 2018-02-13 DIAGNOSIS — E1151 Type 2 diabetes mellitus with diabetic peripheral angiopathy without gangrene: Secondary | ICD-10-CM | POA: Diagnosis present

## 2018-02-13 DIAGNOSIS — F418 Other specified anxiety disorders: Secondary | ICD-10-CM | POA: Diagnosis present

## 2018-02-13 DIAGNOSIS — IMO0002 Reserved for concepts with insufficient information to code with codable children: Secondary | ICD-10-CM | POA: Diagnosis present

## 2018-02-13 LAB — COMPREHENSIVE METABOLIC PANEL
ALT: 15 U/L — AB (ref 17–63)
AST: 22 U/L (ref 15–41)
Albumin: 3 g/dL — ABNORMAL LOW (ref 3.5–5.0)
Alkaline Phosphatase: 90 U/L (ref 38–126)
Anion gap: 9 (ref 5–15)
BILIRUBIN TOTAL: 0.6 mg/dL (ref 0.3–1.2)
BUN: 24 mg/dL — AB (ref 6–20)
CO2: 22 mmol/L (ref 22–32)
CREATININE: 1.85 mg/dL — AB (ref 0.61–1.24)
Calcium: 8.7 mg/dL — ABNORMAL LOW (ref 8.9–10.3)
Chloride: 102 mmol/L (ref 101–111)
GFR calc Af Amer: 42 mL/min — ABNORMAL LOW (ref >60)
GFR, EST NON AFRICAN AMERICAN: 36 mL/min — AB (ref >60)
Glucose, Bld: 220 mg/dL — ABNORMAL HIGH (ref 65–99)
Potassium: 4.4 mmol/L (ref 3.5–5.1)
Sodium: 133 mmol/L — ABNORMAL LOW (ref 135–145)
Total Protein: 7.5 g/dL (ref 6.5–8.1)

## 2018-02-13 LAB — I-STAT TROPONIN, ED: TROPONIN I, POC: 0.23 ng/mL — AB (ref 0.00–0.08)

## 2018-02-13 LAB — CBC
HEMATOCRIT: 35.4 % — AB (ref 39.0–52.0)
Hemoglobin: 11.4 g/dL — ABNORMAL LOW (ref 13.0–17.0)
MCH: 26.1 pg (ref 26.0–34.0)
MCHC: 32.2 g/dL (ref 30.0–36.0)
MCV: 81 fL (ref 78.0–100.0)
Platelets: 331 10*3/uL (ref 150–400)
RBC: 4.37 MIL/uL (ref 4.22–5.81)
RDW: 13.1 % (ref 11.5–15.5)
WBC: 7 10*3/uL (ref 4.0–10.5)

## 2018-02-13 MED ORDER — FENTANYL CITRATE (PF) 100 MCG/2ML IJ SOLN
50.0000 ug | Freq: Once | INTRAMUSCULAR | Status: AC
  Administered 2018-02-13: 50 ug via INTRAVENOUS
  Filled 2018-02-13: qty 2

## 2018-02-13 MED ORDER — IOPAMIDOL (ISOVUE-370) INJECTION 76%
100.0000 mL | Freq: Once | INTRAVENOUS | Status: AC | PRN
  Administered 2018-02-13: 100 mL via INTRAVENOUS

## 2018-02-13 MED ORDER — IOPAMIDOL (ISOVUE-370) INJECTION 76%
INTRAVENOUS | Status: AC
  Filled 2018-02-13: qty 100

## 2018-02-13 NOTE — ED Notes (Signed)
Patient transported to CT 

## 2018-02-13 NOTE — ED Triage Notes (Signed)
Per ems pt was at [facility] for rehab and has been complaining of right shoulder and neck and radiating into his back He had surgery on the 3rd, Triple bypass. Pt also has been complaining for 1 week of nausea at facility. Pt was given tylenol at facility and no relief states pt. 132/69, RR 24, Some SOB, 98% RA, CBG, 153.

## 2018-02-13 NOTE — ED Provider Notes (Signed)
[facility] EMERGENCY DEPARTMENT Provider Note  CSN: [id number] Arrival date & time: 02/13/18 2002  Chief Complaint(s) Shoulder Pain  HPI Michael [name] is a 67 y.o. male with an extensive past medical history listed below including CAD status post three-vessel CABG performed on April 25 and subsequently discharged to rehab facility.  He presents today with several days of right shoulder and neck pain.  Pain is exacerbated with movement and palpation of the right shoulder.  Denies any fevers or chills.  Endorses nausea without vomiting.  He denies any coughing.  He does report that he is been having chest wall pain related to the recent surgery which is exacerbated with palpation of the chest and breathing.  He is endorsing mild shortness of breath due to the pain associated with breathing.  Denies any abdominal pain.  No diarrhea.  No urinary symptoms.  No headache.  HPI  Past Medical History Past Medical History:  Diagnosis Date  . A-fib ([place])   . Asthma    No PFTs, history of childhood asthma  . CAD (coronary artery disease)    a. 06/2013 STEMI/PCI ([facility]): LAD w/ thrombus (treated with BMS), mid 75%, D2 75%; LCX OM2 75%; RCA small, PDA 95%, PLV 95%;  b. 10/2013 Cath/PCI: ISR w/in LAD (Promus DES x 2), borderline OM2 lesion;  c. 01/2014 MV: Intermediate risk, medium-sized distal ant wall infarct w/ very small amt of peri-infarct ischemia. EF 60%.  . Cellulitis 04/2014   left facial  . Chondromalacia of medial femoral condyle    Left knee MRI 04/28/12: Chondromalacia of the medial femoral condyle with slight peripheral degeneration of the meniscocapsular junction of the medial meniscus; followed by sports medicine  . Collagen vascular disease ([place])   . Crack cocaine use    for 20+ years, has been enrolled in detox programs in the past  . Depression    with history of hospitalization for suicidal ideation  . Diabetes mellitus 2002   Diagnosed in 2002, started insulin  in 2012  . Gout   . Gout 04/28/2012  . Headache(784.0)    CT head 08/2011: Periventricular and subcortical white matter hypodensities are most in keeping with chronic microangiopathic change  . HIV infection [facility]) Nov 2012   Followed by [name]  . Hyperlipidemia   . Hypertension   . Pulmonary embolism [facility])    Patient Active Problem List   Diagnosis Date Noted  . AKI (acute kidney injury) ([place]) 02/14/2018  . HCAP (healthcare-associated pneumonia) 02/14/2018  . Hepatitis C 12/30/2016  . Back pain 04/18/2016  . S/P carotid endarterectomy 11/15/2015  . MDD (major depressive disorder), recurrent severe, without psychosis ([place]) 09/09/2015  . Cocaine-induced mood disorder ([place]) 08/14/2015  . Cocaine abuse with cocaine-induced mood disorder ([place]) 08/14/2015  . Gout 07/10/2015  . CKD (chronic kidney disease) stage 3, GFR 30-59 ml/min ([name]) 03/06/2015  . Normocytic anemia 03/06/2015  . Hypoglycemia   . Encounter for general adult medical examination with abnormal findings 02/09/2015  . Cocaine use disorder, severe, dependence ([place]) 12/13/2014  . Substance or medication-induced depressive disorder with onset during withdrawal ([place]) 12/13/2014  . Severe recurrent major depressive disorder with psychotic features ([place]) 12/12/2014  . Cervicalgia 06/28/2014  . Lumbar radiculopathy, chronic 06/28/2014  . Asthma, chronic 02/03/2014  . 3-vessel CAD 06/24/2013  . ED (erectile dysfunction) of organic origin 07/07/2012  . Hypertension goal BP (blood pressure) < 140/80 04/29/2012  . Chondromalacia of left knee 03/19/2012  . Hyperlipidemia with target LDL  less than 100 02/12/2012  . Fibromyalgia 02/12/2012  . HIV (human immunodeficiency virus infection) ([place]) 08/27/2011  . Uncontrolled type 2 diabetes with neuropathy ([place]) 10/17/2000   Home Medication(s) Prior to Admission medications   Medication Sig Start Date End Date Taking? Authorizing Provider  ACCU-CHEK SOFTCLIX LANCETS lancets USE TO  CHECK BLOOD GLUCOSE LEVELS UP TO 4 TIMES DAILY AS DIRECTED 11/24/17  Yes Nwoko, Herbert Pun I, NP  albuterol (PROAIR HFA) 108 (90 Base) MCG/ACT inhaler INHALE TWO PUFFS INTO THE LUNGS EVERY 6 (SIX) HOURS AS NEEDED FOR WHEEZING OR SHORTNESS OF BREATH. 11/24/17  Yes Lindell Spar I, NP  allopurinol (ZYLOPRIM) 300 MG tablet Take 1 tablet (300 mg total) by mouth daily. For gout. 11/25/17  Yes Lindell Spar I, NP  aspirin 81 MG chewable tablet Chew 81 mg by mouth daily.   Yes [provider]  diltiazem (CARDIZEM CD) 120 MG 24 hr capsule Take 1 capsule (120 mg total) by mouth daily. For high blood pressure 11/25/17  Yes Lindell Spar I, NP  emtricitabine-rilpivir-tenofovir AF (ODEFSEY) 200-25-25 MG TABS tablet Take 1 tablet by mouth daily with breakfast. For HIV infection 11/24/17  Yes Nwoko, Herbert Pun I, NP  famotidine (PEPCID) 20 MG tablet Take 1 tablet (20 mg total) by mouth daily. For acid reflux 11/24/17  Yes Lindell Spar I, NP  gabapentin (NEURONTIN) 400 MG capsule Take 1 capsule (400 mg total) by mouth 2 (two) times daily. For agitation/neuropathic pain Patient taking differently: Take 400 mg by mouth 2 (two) times daily as needed (For agitation/neuropathic pain).  11/24/17  Yes Lindell Spar I, NP  hydrochlorothiazide (MICROZIDE) 12.5 MG capsule Take 1 capsule (12.5 mg total) by mouth daily. For high blood pressure 11/25/17  Yes Nwoko, Herbert Pun I, NP  hydrOXYzine (ATARAX/VISTARIL) 25 MG tablet Take 1 tablet (25 mg total) by mouth 3 (three) times daily as needed for anxiety. 11/24/17  Yes Lindell Spar I, NP  insulin glargine (LANTUS) 100 UNIT/ML injection Inject 0.3 mLs (30 Units total) into the skin at bedtime. For diabetes management 11/24/17  Yes Lindell Spar I, NP  insulin lispro (HUMALOG) 100 UNIT/ML injection Inject 0.05 mLs (5 Units total) into the skin 3 (three) times daily with meals. For diabetes management Patient taking differently: Inject 4 Units into the skin 3 (three) times daily with meals. For diabetes  management 11/24/17  Yes Lindell Spar I, NP  Insulin Syringe-Needle U-100 (B-D INS SYR MICROFINE 1CC/27G) 27G X 5/8" 1 ML MISC 1 strip by Does not apply route 3 (three) times daily as needed. For blood sugar checks 11/24/17  Yes Nwoko, Herbert Pun I, NP  isosorbide mononitrate (IMDUR) 30 MG 24 hr tablet Take 1 tablet (30 mg total) by mouth daily. For chest 11/25/17  Yes Lindell Spar I, NP  losartan (COZAAR) 50 MG tablet Take 1 tablet (50 mg total) by mouth daily. For high blood pressure 11/25/17  Yes Nwoko, Herbert Pun I, NP  metFORMIN (GLUCOPHAGE) 1000 MG tablet Take 1 tablet (1,000 mg total) by mouth 2 (two) times daily with a meal. For diabetes management 11/24/17  Yes Lindell Spar I, NP  pravastatin (PRAVACHOL) 40 MG tablet Take 1 tablet (40 mg total) by mouth at bedtime. For high cholesterol 11/24/17  Yes Lindell Spar I, NP  sertraline (ZOLOFT) 50 MG tablet Take 1 tablet (50 mg total) by mouth daily. For depression 11/25/17  Yes Lindell Spar I, NP  traZODone (DESYREL) 50 MG tablet Take 1 tablet (50 mg total) by mouth at bedtime as needed for  sleep. 11/24/17  Yes Encarnacion Slates, NP                                                                                                                                    Past Surgical History Past Surgical History:  Procedure Laterality Date  . BACK SURGERY     1988  . BOWEL RESECTION    . CARDIAC SURGERY    . CERVICAL SPINE SURGERY     " rods in my neck "  . CORONARY STENT PLACEMENT    . NM MYOCAR PERF WALL MOTION  12/27/2011   normal  . SPINE SURGERY     Family History Family History  Problem Relation Age of Onset  . Diabetes Mother   . Hypertension Mother   . Hyperlipidemia Mother   . Diabetes Father   . Cancer Father   . Hypertension Father   . Diabetes Brother   . Heart disease Brother   . Diabetes Sister   . Colon cancer Neg Hx     Social History Social History   Tobacco Use  . Smoking status: Never Smoker  . Smokeless tobacco: Never Used    Substance Use Topics  . Alcohol use: No    Alcohol/week: 2.4 oz    Types: 2 Cans of beer, 2 Shots of liquor per week  . Drug use: Yes    Types: "Crack" cocaine, Cocaine    Comment: last use January 2018   Allergies Patient has no known allergies.  Review of Systems Review of Systems All other systems are reviewed and are negative for acute change except as noted in the HPI  Physical Exam Vital Signs  I have reviewed the triage vital signs BP 123/83   Pulse 87   Temp 98.3 F (36.8 C) (Oral)   Resp 19   Ht 5\' 8"  (1.727 m)   Wt 75.8 kg (167 lb)   SpO2 99%   BMI 25.39 kg/m   Physical Exam  Constitutional: He is oriented to person, place, and time. He appears well-developed and well-nourished. No distress.  HENT:  Head: Normocephalic and atraumatic.  Nose: Nose normal.  Eyes: Pupils are equal, round, and reactive to light. Conjunctivae and EOM are normal. Right eye exhibits no discharge. Left eye exhibits no discharge. No scleral icterus.  Neck: Normal range of motion. Neck supple.  Cardiovascular: Normal rate and regular rhythm. Exam reveals no gallop and no friction rub.  No murmur heard. Pulmonary/Chest: Effort normal. No stridor. No respiratory distress. He has rales in the right lower field.    Abdominal: Soft. He exhibits no distension. There is no tenderness.  Musculoskeletal: He exhibits no edema.       Cervical back: He exhibits tenderness and spasm. He exhibits no bony tenderness.       Back:  Neurological: He is alert and oriented to person, place, and time.  Skin: Skin is warm and  dry. No rash noted. He is not diaphoretic. No erythema.  Psychiatric: He has a normal mood and affect.  Vitals reviewed.   ED Results and Treatments Labs (all labs ordered are listed, but only abnormal results are displayed) Labs Reviewed  CBC - Abnormal; Notable for the following components:      Result Value   Hemoglobin 11.4 (*)    HCT 35.4 (*)    All other components  within normal limits  COMPREHENSIVE METABOLIC PANEL - Abnormal; Notable for the following components:   Sodium 133 (*)    Glucose, Bld 220 (*)    BUN 24 (*)    Creatinine, Ser 1.85 (*)    Calcium 8.7 (*)    Albumin 3.0 (*)    ALT 15 (*)    GFR calc non Af Amer 36 (*)    GFR calc Af Amer 42 (*)    All other components within normal limits  I-STAT TROPONIN, ED - Abnormal; Notable for the following components:   Troponin i, poc 0.23 (*)    All other components within normal limits  I-STAT TROPONIN, ED - Abnormal; Notable for the following components:   Troponin i, poc 0.20 (*)    All other components within normal limits                                                                                                                         EKG  EKG Interpretation  Date/Time:  Friday Feb 13 2018 20:16:18 EDT Ventricular Rate:  87 PR Interval:    QRS Duration: 94 QT Interval:  393 QTC Calculation: 473 R Axis:   70 Text Interpretation:  Sinus rhythm Probable left atrial enlargement Nonspecific T abnormalities, lateral leads No significant change since last tracing Confirmed by Addison Lank [phone]) on 02/13/2018 9:50:45 PM      Radiology Ct Angio Chest Pe W And/or Wo Contrast  Result Date: 02/13/2018 CLINICAL DATA:  67 year old male with shortness of breath. EXAM: CT ANGIOGRAPHY CHEST WITH CONTRAST TECHNIQUE: Multidetector CT imaging of the chest was performed using the standard protocol during bolus administration of intravenous contrast. Multiplanar CT image reconstructions and MIPs were obtained to evaluate the vascular anatomy. CONTRAST:  166mL ISOVUE-370 IOPAMIDOL (ISOVUE-370) INJECTION 76% COMPARISON:  Chest radiograph dated 04/01/2017 and CT dated 04/02/2017 FINDINGS: Cardiovascular: There is no cardiomegaly or pericardial effusion. Multi vessel coronary vascular calcification and postsurgical changes of CABG. Mild anterior mediastinal/retrosternal stranding related to recent  surgery. No fluid collection. There is mild atherosclerotic calcification of the thoracic aorta. No aneurysmal dilatation or dissection. Evaluation of the pulmonary arteries is limited due to respiratory motion artifact. No pulmonary artery embolus identified. Mediastinum/Nodes: There is no hilar or mediastinal adenopathy. Esophagus and the thyroid gland are grossly unremarkable. No mediastinal fluid collection. Lungs/Pleura: There is an area of consolidative change with air bronchogram in the right lower lobe. Focal area of hazy and ground-glass density in the medial basal segment of the left lower lobe noted  which may represent atelectasis/scarring versus infiltrate although an area of infarct is not excluded. There is no pleural effusion or pneumothorax. The central airways are patent. Upper Abdomen: No acute abnormality. Musculoskeletal: Median sternotomy wires. Degenerative changes of the spine. No acute osseous pathology Review of the MIP images confirms the above findings. IMPRESSION: 1. No CT evidence of pulmonary embolism. 2. Consolidative changes of the right lower lobe most consistent with pneumonia. Clinical correlation and follow-up to resolution recommended. Smaller focal ground-glass density at the left lung base may also represent infection. 3. Multi vessel coronary vascular calcification and postsurgical changes of CABG. No mediastinal fluid collection. Electronically Signed   By: Anner Crete M.D.   On: 02/13/2018 23:28   Pertinent labs & imaging results that were available during my care of the patient were reviewed by me and considered in my medical decision making (see chart for details).  Medications Ordered in ED Medications  iopamidol (ISOVUE-370) 76 % injection (has no administration in time range)  fentaNYL (SUBLIMAZE) injection 50 mcg (50 mcg Intravenous Given 02/13/18 2117)  iopamidol (ISOVUE-370) 76 % injection 100 mL (100 mLs Intravenous Contrast Given 02/13/18 2305)                                                                                                                                     Procedures Procedures  (including critical care time)  Medical Decision Making / ED Course I have reviewed the nursing notes for this encounter and the patient's prior records (if available in EHR or on provided paperwork).    Right shoulder pain is likely MSK nature however patient has significant past medical history including recent surgery, previous PEs, well-controlled HIV.  Screening labs obtain revealed no evidence of leukocytosis significant electrolyte derangements.,  Renal  function close to patient's baseline.  EKG without acute ischemic changes or evidence of pericarditis.  Initial troponin was elevated at 0.23, this is likely due to recent surgery.  Will obtain additional troponins to ensure downtrend.  CT a was obtained to rule out PE or infectious process.  It was negative for PE but did reveal evidence of right lower lobe pneumonia. Case discussed with medicine for IV abx treatment.  Trop trending down.    Final Clinical Impression(s) / ED Diagnoses Final diagnoses:  HCAP (healthcare-associated pneumonia)      This chart was dictated using voice recognition software.  Despite best efforts to proofread,  errors can occur which can change the documentation meaning.   Fatima Blank, MD 02/14/18 [phone]

## 2018-02-14 ENCOUNTER — Other Ambulatory Visit: Payer: Self-pay

## 2018-02-14 DIAGNOSIS — N179 Acute kidney failure, unspecified: Secondary | ICD-10-CM | POA: Diagnosis not present

## 2018-02-14 DIAGNOSIS — I10 Essential (primary) hypertension: Secondary | ICD-10-CM | POA: Diagnosis not present

## 2018-02-14 DIAGNOSIS — J189 Pneumonia, unspecified organism: Secondary | ICD-10-CM | POA: Diagnosis not present

## 2018-02-14 DIAGNOSIS — E1165 Type 2 diabetes mellitus with hyperglycemia: Secondary | ICD-10-CM | POA: Diagnosis present

## 2018-02-14 DIAGNOSIS — R52 Pain, unspecified: Secondary | ICD-10-CM | POA: Diagnosis not present

## 2018-02-14 DIAGNOSIS — Z951 Presence of aortocoronary bypass graft: Secondary | ICD-10-CM | POA: Diagnosis not present

## 2018-02-14 DIAGNOSIS — E138 Other specified diabetes mellitus with unspecified complications: Secondary | ICD-10-CM | POA: Diagnosis not present

## 2018-02-14 DIAGNOSIS — I2510 Atherosclerotic heart disease of native coronary artery without angina pectoris: Secondary | ICD-10-CM

## 2018-02-14 DIAGNOSIS — Z955 Presence of coronary angioplasty implant and graft: Secondary | ICD-10-CM | POA: Diagnosis not present

## 2018-02-14 DIAGNOSIS — E1140 Type 2 diabetes mellitus with diabetic neuropathy, unspecified: Secondary | ICD-10-CM | POA: Diagnosis present

## 2018-02-14 DIAGNOSIS — Z79899 Other long term (current) drug therapy: Secondary | ICD-10-CM | POA: Diagnosis not present

## 2018-02-14 DIAGNOSIS — N183 Chronic kidney disease, stage 3 (moderate): Secondary | ICD-10-CM

## 2018-02-14 DIAGNOSIS — I248 Other forms of acute ischemic heart disease: Secondary | ICD-10-CM | POA: Diagnosis present

## 2018-02-14 DIAGNOSIS — Y95 Nosocomial condition: Secondary | ICD-10-CM | POA: Diagnosis present

## 2018-02-14 DIAGNOSIS — Z7982 Long term (current) use of aspirin: Secondary | ICD-10-CM | POA: Diagnosis not present

## 2018-02-14 DIAGNOSIS — E785 Hyperlipidemia, unspecified: Secondary | ICD-10-CM | POA: Diagnosis not present

## 2018-02-14 DIAGNOSIS — F333 Major depressive disorder, recurrent, severe with psychotic symptoms: Secondary | ICD-10-CM | POA: Diagnosis present

## 2018-02-14 DIAGNOSIS — Z86711 Personal history of pulmonary embolism: Secondary | ICD-10-CM | POA: Diagnosis not present

## 2018-02-14 DIAGNOSIS — Z21 Asymptomatic human immunodeficiency virus [HIV] infection status: Secondary | ICD-10-CM | POA: Diagnosis not present

## 2018-02-14 DIAGNOSIS — I4891 Unspecified atrial fibrillation: Secondary | ICD-10-CM | POA: Diagnosis present

## 2018-02-14 DIAGNOSIS — Z833 Family history of diabetes mellitus: Secondary | ICD-10-CM | POA: Diagnosis not present

## 2018-02-14 DIAGNOSIS — J449 Chronic obstructive pulmonary disease, unspecified: Secondary | ICD-10-CM | POA: Diagnosis not present

## 2018-02-14 DIAGNOSIS — F1421 Cocaine dependence, in remission: Secondary | ICD-10-CM | POA: Diagnosis present

## 2018-02-14 DIAGNOSIS — Z8249 Family history of ischemic heart disease and other diseases of the circulatory system: Secondary | ICD-10-CM | POA: Diagnosis not present

## 2018-02-14 DIAGNOSIS — Z794 Long term (current) use of insulin: Secondary | ICD-10-CM | POA: Diagnosis not present

## 2018-02-14 DIAGNOSIS — F418 Other specified anxiety disorders: Secondary | ICD-10-CM | POA: Diagnosis present

## 2018-02-14 DIAGNOSIS — I129 Hypertensive chronic kidney disease with stage 1 through stage 4 chronic kidney disease, or unspecified chronic kidney disease: Secondary | ICD-10-CM | POA: Diagnosis present

## 2018-02-14 DIAGNOSIS — E1122 Type 2 diabetes mellitus with diabetic chronic kidney disease: Secondary | ICD-10-CM | POA: Diagnosis present

## 2018-02-14 DIAGNOSIS — M25511 Pain in right shoulder: Secondary | ICD-10-CM | POA: Diagnosis not present

## 2018-02-14 DIAGNOSIS — E440 Moderate protein-calorie malnutrition: Secondary | ICD-10-CM | POA: Diagnosis present

## 2018-02-14 DIAGNOSIS — M6281 Muscle weakness (generalized): Secondary | ICD-10-CM | POA: Diagnosis not present

## 2018-02-14 DIAGNOSIS — R279 Unspecified lack of coordination: Secondary | ICD-10-CM | POA: Diagnosis not present

## 2018-02-14 DIAGNOSIS — I213 ST elevation (STEMI) myocardial infarction of unspecified site: Secondary | ICD-10-CM | POA: Diagnosis not present

## 2018-02-14 DIAGNOSIS — B20 Human immunodeficiency virus [HIV] disease: Secondary | ICD-10-CM | POA: Diagnosis present

## 2018-02-14 DIAGNOSIS — Z743 Need for continuous supervision: Secondary | ICD-10-CM | POA: Diagnosis not present

## 2018-02-14 LAB — CBC WITH DIFFERENTIAL/PLATELET
BASOS PCT: 0 %
Basophils Absolute: 0 10*3/uL (ref 0.0–0.1)
Eosinophils Absolute: 0.1 10*3/uL (ref 0.0–0.7)
Eosinophils Relative: 2 %
HEMATOCRIT: 34 % — AB (ref 39.0–52.0)
Hemoglobin: 10.9 g/dL — ABNORMAL LOW (ref 13.0–17.0)
LYMPHS ABS: 1.6 10*3/uL (ref 0.7–4.0)
Lymphocytes Relative: 24 %
MCH: 26.1 pg (ref 26.0–34.0)
MCHC: 32.1 g/dL (ref 30.0–36.0)
MCV: 81.5 fL (ref 78.0–100.0)
MONO ABS: 0.6 10*3/uL (ref 0.1–1.0)
MONOS PCT: 9 %
NEUTROS ABS: 4.4 10*3/uL (ref 1.7–7.7)
Neutrophils Relative %: 65 %
Platelets: 323 10*3/uL (ref 150–400)
RBC: 4.17 MIL/uL — ABNORMAL LOW (ref 4.22–5.81)
RDW: 13.3 % (ref 11.5–15.5)
WBC: 6.7 10*3/uL (ref 4.0–10.5)

## 2018-02-14 LAB — BASIC METABOLIC PANEL
ANION GAP: 9 (ref 5–15)
BUN: 20 mg/dL (ref 6–20)
CALCIUM: 8.8 mg/dL — AB (ref 8.9–10.3)
CHLORIDE: 101 mmol/L (ref 101–111)
CO2: 25 mmol/L (ref 22–32)
Creatinine, Ser: 1.8 mg/dL — ABNORMAL HIGH (ref 0.61–1.24)
GFR calc Af Amer: 43 mL/min — ABNORMAL LOW (ref >60)
GFR calc non Af Amer: 37 mL/min — ABNORMAL LOW (ref >60)
Glucose, Bld: 192 mg/dL — ABNORMAL HIGH (ref 65–99)
Potassium: 4.6 mmol/L (ref 3.5–5.1)
Sodium: 135 mmol/L (ref 135–145)

## 2018-02-14 LAB — GLUCOSE, CAPILLARY
GLUCOSE-CAPILLARY: 160 mg/dL — AB (ref 65–99)
Glucose-Capillary: 175 mg/dL — ABNORMAL HIGH (ref 65–99)
Glucose-Capillary: 262 mg/dL — ABNORMAL HIGH (ref 65–99)
Glucose-Capillary: 275 mg/dL — ABNORMAL HIGH (ref 65–99)

## 2018-02-14 LAB — I-STAT TROPONIN, ED: Troponin i, poc: 0.2 ng/mL (ref 0.00–0.08)

## 2018-02-14 LAB — CBG MONITORING, ED: Glucose-Capillary: 250 mg/dL — ABNORMAL HIGH (ref 65–99)

## 2018-02-14 LAB — TROPONIN I
Troponin I: 0.21 ng/mL (ref <0.03)
Troponin I: 0.2 ng/mL (ref <0.03)

## 2018-02-14 LAB — STREP PNEUMONIAE URINARY ANTIGEN: Strep Pneumo Urinary Antigen: NEGATIVE

## 2018-02-14 LAB — PROCALCITONIN: Procalcitonin: <0.1 ng/mL

## 2018-02-14 MED ORDER — HEPARIN SODIUM (PORCINE) 5000 UNIT/ML IJ SOLN
5000.0000 [IU] | Freq: Three times a day (TID) | INTRAMUSCULAR | Status: DC
  Administered 2018-02-14 – 2018-02-17 (×10): 5000 [IU] via SUBCUTANEOUS
  Filled 2018-02-14 (×10): qty 1

## 2018-02-14 MED ORDER — SODIUM CHLORIDE 0.9 % IV SOLN
2.0000 g | INTRAVENOUS | Status: DC
  Administered 2018-02-14 – 2018-02-17 (×4): 2 g via INTRAVENOUS
  Filled 2018-02-14 (×4): qty 2

## 2018-02-14 MED ORDER — ACETAMINOPHEN 325 MG PO TABS: 650.0000 mg | ORAL_TABLET | Freq: Four times a day (QID) | ORAL | Status: DC | PRN

## 2018-02-14 MED ORDER — ONDANSETRON HCL 4 MG PO TABS: 4.0000 mg | ORAL_TABLET | Freq: Four times a day (QID) | ORAL | Status: DC | PRN

## 2018-02-14 MED ORDER — FAMOTIDINE 20 MG PO TABS
20.0000 mg | ORAL_TABLET | Freq: Every day | ORAL | Status: DC
  Administered 2018-02-14 – 2018-02-17 (×4): 20 mg via ORAL
  Filled 2018-02-14 (×4): qty 1

## 2018-02-14 MED ORDER — INSULIN ASPART 100 UNIT/ML ~~LOC~~ SOLN
0.0000 [IU] | Freq: Every day | SUBCUTANEOUS | Status: DC
Start: 2018-02-14 — End: 2018-02-17
  Administered 2018-02-14: 2 [IU] via SUBCUTANEOUS
  Administered 2018-02-15: 3 [IU] via SUBCUTANEOUS
  Administered 2018-02-16: 2 [IU] via SUBCUTANEOUS
  Filled 2018-02-14: qty 1

## 2018-02-14 MED ORDER — GLUCERNA SHAKE PO LIQD
237.0000 mL | Freq: Every day | ORAL | Status: DC
  Administered 2018-02-15 – 2018-02-17 (×3): 237 mL via ORAL

## 2018-02-14 MED ORDER — MORPHINE SULFATE (PF) 4 MG/ML IV SOLN
4.0000 mg | INTRAVENOUS | Status: DC | PRN
  Administered 2018-02-14: 4 mg via INTRAVENOUS
  Filled 2018-02-14: qty 1

## 2018-02-14 MED ORDER — SODIUM CHLORIDE 0.9% FLUSH
3.0000 mL | Freq: Two times a day (BID) | INTRAVENOUS | Status: DC
  Administered 2018-02-14 – 2018-02-17 (×8): 3 mL via INTRAVENOUS

## 2018-02-14 MED ORDER — ALLOPURINOL 300 MG PO TABS
300.0000 mg | ORAL_TABLET | Freq: Every day | ORAL | Status: DC
  Administered 2018-02-14 – 2018-02-17 (×4): 300 mg via ORAL
  Filled 2018-02-14 (×8): qty 1

## 2018-02-14 MED ORDER — ASPIRIN 81 MG PO CHEW
81.0000 mg | CHEWABLE_TABLET | Freq: Every day | ORAL | Status: DC
  Administered 2018-02-14 – 2018-02-17 (×4): 81 mg via ORAL
  Filled 2018-02-14 (×4): qty 1

## 2018-02-14 MED ORDER — EMTRICITAB-RILPIVIR-TENOFOV AF 200-25-25 MG PO TABS
1.0000 | ORAL_TABLET | Freq: Every day | ORAL | Status: DC
  Administered 2018-02-14 – 2018-02-17 (×4): 1 via ORAL
  Filled 2018-02-14 (×4): qty 1

## 2018-02-14 MED ORDER — SODIUM CHLORIDE 0.9 % IV SOLN
1.0000 g | Freq: Once | INTRAVENOUS | Status: DC
  Filled 2018-02-14: qty 10

## 2018-02-14 MED ORDER — ACETAMINOPHEN 650 MG RE SUPP: 650.0000 mg | Freq: Four times a day (QID) | RECTAL | Status: DC | PRN

## 2018-02-14 MED ORDER — ISOSORBIDE MONONITRATE ER 30 MG PO TB24
30.0000 mg | ORAL_TABLET | Freq: Every day | ORAL | Status: DC
  Administered 2018-02-14 – 2018-02-17 (×4): 30 mg via ORAL
  Filled 2018-02-14 (×4): qty 1

## 2018-02-14 MED ORDER — INSULIN GLARGINE 100 UNIT/ML ~~LOC~~ SOLN
20.0000 [IU] | Freq: Every day | SUBCUTANEOUS | Status: DC
  Administered 2018-02-14 – 2018-02-16 (×4): 20 [IU] via SUBCUTANEOUS
  Filled 2018-02-14 (×4): qty 0.2

## 2018-02-14 MED ORDER — ENSURE ENLIVE PO LIQD
237.0000 mL | Freq: Two times a day (BID) | ORAL | Status: DC
  Administered 2018-02-14 (×2): 237 mL via ORAL

## 2018-02-14 MED ORDER — PREMIER PROTEIN SHAKE
11.0000 [oz_av] | Freq: Every day | ORAL | Status: DC
  Administered 2018-02-14 – 2018-02-16 (×3): 11 [oz_av] via ORAL
  Filled 2018-02-14 (×7): qty 325.31

## 2018-02-14 MED ORDER — HYDROMORPHONE HCL 2 MG/ML IJ SOLN
0.5000 mg | Freq: Once | INTRAMUSCULAR | Status: AC
  Administered 2018-02-14: 0.5 mg via INTRAVENOUS

## 2018-02-14 MED ORDER — SODIUM CHLORIDE 0.9% FLUSH
3.0000 mL | Freq: Two times a day (BID) | INTRAVENOUS | Status: DC
  Administered 2018-02-14 – 2018-02-16 (×2): 3 mL via INTRAVENOUS

## 2018-02-14 MED ORDER — PIPERACILLIN-TAZOBACTAM 3.375 G IVPB
3.3750 g | Freq: Three times a day (TID) | INTRAVENOUS | Status: DC
  Administered 2018-02-14: 3.375 g via INTRAVENOUS
  Filled 2018-02-14: qty 50

## 2018-02-14 MED ORDER — HYDROXYZINE HCL 25 MG PO TABS
25.0000 mg | ORAL_TABLET | Freq: Three times a day (TID) | ORAL | Status: DC | PRN
Start: 2018-02-14 — End: 2018-02-17

## 2018-02-14 MED ORDER — ALBUTEROL SULFATE (2.5 MG/3ML) 0.083% IN NEBU: 3.0000 mL | INHALATION_SOLUTION | RESPIRATORY_TRACT | Status: DC | PRN

## 2018-02-14 MED ORDER — SODIUM CHLORIDE 0.9% FLUSH
3.0000 mL | INTRAVENOUS | Status: DC | PRN
Start: 2018-02-14 — End: 2018-02-17

## 2018-02-14 MED ORDER — DILTIAZEM HCL ER COATED BEADS 120 MG PO CP24
120.0000 mg | ORAL_CAPSULE | Freq: Every day | ORAL | Status: DC
  Administered 2018-02-14 – 2018-02-17 (×4): 120 mg via ORAL
  Filled 2018-02-14 (×4): qty 1

## 2018-02-14 MED ORDER — HYDRALAZINE HCL 20 MG/ML IJ SOLN: 10.0000 mg | INTRAMUSCULAR | Status: DC | PRN

## 2018-02-14 MED ORDER — ONDANSETRON HCL 4 MG/2ML IJ SOLN
4.0000 mg | Freq: Four times a day (QID) | INTRAMUSCULAR | Status: DC | PRN
  Administered 2018-02-14: 4 mg via INTRAVENOUS
  Filled 2018-02-14: qty 2

## 2018-02-14 MED ORDER — SODIUM CHLORIDE 0.9 % IV SOLN: 250.0000 mL | INTRAVENOUS | Status: DC | PRN

## 2018-02-14 MED ORDER — PRAVASTATIN SODIUM 40 MG PO TABS
40.0000 mg | ORAL_TABLET | Freq: Every day | ORAL | Status: DC
  Administered 2018-02-14 – 2018-02-16 (×3): 40 mg via ORAL
  Filled 2018-02-14 (×3): qty 1

## 2018-02-14 MED ORDER — VANCOMYCIN HCL IN DEXTROSE 1-5 GM/200ML-% IV SOLN
1000.0000 mg | INTRAVENOUS | Status: DC
  Administered 2018-02-14 – 2018-02-17 (×4): 1000 mg via INTRAVENOUS
  Filled 2018-02-14 (×4): qty 200

## 2018-02-14 MED ORDER — HYDROCODONE-ACETAMINOPHEN 5-325 MG PO TABS
1.0000 | ORAL_TABLET | ORAL | Status: DC | PRN
  Administered 2018-02-14 – 2018-02-17 (×8): 2 via ORAL
  Filled 2018-02-14 (×8): qty 2

## 2018-02-14 MED ORDER — HYDROMORPHONE HCL 2 MG/ML IJ SOLN
INTRAMUSCULAR | Status: AC
  Filled 2018-02-14: qty 1

## 2018-02-14 MED ORDER — SERTRALINE HCL 50 MG PO TABS
50.0000 mg | ORAL_TABLET | Freq: Every day | ORAL | Status: DC
  Administered 2018-02-14 – 2018-02-17 (×4): 50 mg via ORAL
  Filled 2018-02-14 (×4): qty 1

## 2018-02-14 MED ORDER — INSULIN ASPART 100 UNIT/ML ~~LOC~~ SOLN
0.0000 [IU] | Freq: Three times a day (TID) | SUBCUTANEOUS | Status: DC
  Administered 2018-02-14: 3 [IU] via SUBCUTANEOUS
  Administered 2018-02-14 (×2): 8 [IU] via SUBCUTANEOUS
  Administered 2018-02-15: 3 [IU] via SUBCUTANEOUS
  Administered 2018-02-15: 5 [IU] via SUBCUTANEOUS
  Administered 2018-02-15 – 2018-02-16 (×2): 8 [IU] via SUBCUTANEOUS
  Administered 2018-02-16: 11 [IU] via SUBCUTANEOUS
  Administered 2018-02-16 – 2018-02-17 (×2): 3 [IU] via SUBCUTANEOUS
  Administered 2018-02-17: 8 [IU] via SUBCUTANEOUS

## 2018-02-14 MED ORDER — TRAZODONE HCL 50 MG PO TABS
50.0000 mg | ORAL_TABLET | Freq: Every evening | ORAL | Status: DC | PRN
  Administered 2018-02-14 – 2018-02-16 (×2): 50 mg via ORAL
  Filled 2018-02-14 (×2): qty 1

## 2018-02-14 MED ORDER — BISACODYL 5 MG PO TBEC: 5.0000 mg | DELAYED_RELEASE_TABLET | Freq: Every day | ORAL | Status: DC | PRN

## 2018-02-14 MED ORDER — GABAPENTIN 400 MG PO CAPS
400.0000 mg | ORAL_CAPSULE | Freq: Two times a day (BID) | ORAL | Status: DC
  Administered 2018-02-14 – 2018-02-17 (×7): 400 mg via ORAL
  Filled 2018-02-14 (×7): qty 1

## 2018-02-14 MED ORDER — SODIUM CHLORIDE 0.9 % IV SOLN
500.0000 mg | Freq: Once | INTRAVENOUS | Status: DC
  Filled 2018-02-14: qty 500

## 2018-02-14 NOTE — Progress Notes (Signed)
Initial Nutrition Assessment  DOCUMENTATION CODES:   Non-severe (moderate) malnutrition in context of acute illness/injury  INTERVENTION:  Glucerna Shake po daily, each supplement provides 220 kcal and 10 grams of protein  Premier Protein daily, each supplement provides 160 calories and 30 grams of protein  NUTRITION DIAGNOSIS:   Moderate Malnutrition related to acute illness as evidenced by percent weight loss, energy intake < 75% for > 7 days, mild muscle depletion.  GOAL:   Patient will meet greater than or equal to 90% of their needs  MONITOR:   PO intake, Supplement acceptance, I & O's, Labs  REASON FOR ASSESSMENT:   Malnutrition Screening Tool    ASSESSMENT:   Michael [name] is a 67 y.o. male with PMH of cocaine dependence in remission, DM2, HIV (CD4 62 in April '19 and VL undetectable in Jan '19), depression with anxiety, atrial fibrillation, hypertension, and CAD status post CABG with MAZE procedure on 01/29/2018, now presenting from his SNF for evaluation of pain in the right shoulder and right back.  He had experienced mild nausea with no vomiting following discharge from hospital but had otherwise been ok until he began to experience dyspnea with pleuritic pain in the right side of his upper back and shoulder. Presents with HCAP, AKI on CKD III.  RD drawn to patient for positive MST Spoke with patient and brother at bedside. Patient complains of nausea following CABG with MAZE, states that his intake has been very light and limited since surgery. He was only eating small bowls fruit and drinking a cup of orange juice 3 times a day.  He reports a 10 pound weight loss since surgery with a UBW of around 180 pounds, indicating a 5.5% severe weight loss for timeframe. It is very unlikely he was meeting his calorie needs s/p CABG with fruit and oange juice daily. Also exhibits mild muscle wasting. As a result he meets criteria for moderate malnutrition in context of  acute illness/injury.  He did not have an appetite this morning, says that nausea is under control from a shot he was received, but he just isn't hungry.  Patient did drink 100% of a boost breeze, will change to glucerna and premier to provide more protein and less carbohydrates. Encouraged PO intake, he was receptive to this.  Labs reviewed:  CBGs 262, 175, 250  Medications reviewed and include:  Insulin Odefsey as an outpatient (for HIV infection)  NUTRITION - FOCUSED PHYSICAL EXAM:    Most Recent Value  Orbital Region  No depletion  Upper Arm Region  Mild depletion  Thoracic and Lumbar Region  No depletion  Buccal Region  No depletion  Temple Region  No depletion  Clavicle Bone Region  Mild depletion  Clavicle and Acromion Bone Region  Mild depletion  Scapular Bone Region  Unable to assess  Dorsal Hand  No depletion  Patellar Region  Mild depletion  Anterior Thigh Region  Mild depletion  Posterior Calf Region  Mild depletion       Diet Order:   Diet Order           Diet heart healthy/carb modified Room service appropriate? Yes; Fluid consistency: Thin  Diet effective now          EDUCATION NEEDS:   Not appropriate for education at this time  Skin:  Skin Assessment: Reviewed RN Assessment  Last BM:  02/12/2018  Height:   Ht Readings from Last 1 Encounters:  02/13/18 5\' 8"  (1.727 m)  Weight:   Wt Readings from Last 1 Encounters:  02/14/18 171 lb 8.3 oz (77.8 kg)    Ideal Body Weight:  67.27 kg  BMI:  Body mass index is 26.08 kg/m.  Estimated Nutritional Needs:   Kcal:  5702-2026 (MSJ x1.3-1.5)  Protein:  93-117 grams (1.2-1.5g/kg)  Fluid:  >2L  Satira Anis. Kashawn Dirr, MS, RD LDN Inpatient Clinical Dietitian Pager [phone]

## 2018-02-14 NOTE — Progress Notes (Signed)
[facility]                                                                              Patient Demographics  Michael [name], is a 67 y.o. male, DOB - 11/29/1950, RPR:[id number]  Admit date - 02/13/2018   Admitting Physician Vianne Bulls, MD  Outpatient Primary MD for the patient is [name], PA-C  Outpatient specialists:   LOS - 0  days    Chief Complaint  Patient presents with  . Shoulder Pain       Brief summary  CAD s/p CABG 4/25 presenting with HCAP; has CKD with elevated troponin downtrending, at best ??likely demand ischemia   Michael [name] is a 67 y.o. male with medical history significant for cocaine dependence in remission, HIV (CD4 2 in April '19 and VL undetectable in Jan '19), depression with anxiety, atrial fibrillation, hypertension, and coronary artery disease status post CABG with MAZE procedure on 01/29/2018, who presents from SNF with pain in the right shoulder/ right back and cough with chills without fever.  At the emergency room patient was noted to be tachycardic without acute EKG changes despite mild elevated troponin levels, and CT angiogram was negative for PE but positive for right lower lobe consolidation and admitted for healthcare acquired pneumonia.  Assessment & Plan    Principal Problem:   HCAP (healthcare-associated pneumonia) Active Problems:   HIV (human immunodeficiency virus infection) ([place])   Uncontrolled type 2 diabetes with neuropathy ([place])   Hypertension goal BP (blood pressure) < 140/80   3-vessel CAD   Severe recurrent major depressive disorder with psychotic features ([name])   CKD (chronic kidney disease) stage 3, GFR 30-59 ml/min ([name])   AKI (acute kidney injury) ([place])   . HCAP   - Presents with pleuritic pain, mild dyspnea and cough, right shoulder and back pain - CTA chest negative for PE but concerning for right-sided PNA  - f/ut blood and sputum cultures,strep pneumo antigen and  procalcitonin  - cont vancomycin and cefepime given recent hospitalization and current residence in SNF  - Continue supplemental O2 prn  2. CAD s/p CABG on 4/25; elevated troponin  - No anginal complaints  - Status-post 3-vessel CABG on 01/29/18  - EKG without acute ischemic features, troponin elevated to 0.23 and downtrending  - Continue ASA, statin, nitrates; resume ARB when renal fxn stabilizes    3. Acute kidney injury superimposed on CKD stage III  - SCr is 1.85 on admission, up from 1.50 a week earlier  - Hold losartan and HCTZ, renally-dose medications, avoid nephrotoxins, repeat chemistry panel in am    4. Insulin-dependent DM  - A1c was 10.6% a yr ago  - Managed at home with Lantus 30 units qHS and Humalog 5 units TID  - Continue Lantus with 20 units qHS to start plus SSI with Novolog   5. HIV  - CD4 440 last month; VL undetectable in January 2019   - Continue Odefsey   6. Hypertension - Losartan and HCTZ held for AKI, use IV hydralazine IVP's prn for now  7. Depression with anxiety  - Continue Zoloft, trazodone,  prn Vistaril   8. Hx atrial fibrillation  - Status-post MAZE procedure on 01/29/18  - In sinus rhythm on admission   DVT prophylaxis: sq heparin Code Status: Full  Family Communication: Discussed with patient Consults called: None     Time Spent in minutes  35 minutes  Procedures:    Consultants:     Antimicrobials:      Medications  Scheduled Meds: . allopurinol  300 mg Oral Daily  . aspirin  81 mg Oral Daily  . diltiazem  120 mg Oral Daily  . emtricitabine-rilpivir-tenofovir AF  1 tablet Oral Q breakfast  . famotidine  20 mg Oral Daily  . feeding supplement (ENSURE ENLIVE)  237 mL Oral BID BM  . gabapentin  400 mg Oral BID  . heparin  5,000 Units Subcutaneous Q8H  . insulin aspart  0-15 Units Subcutaneous TID WC  . insulin aspart  0-5 Units Subcutaneous QHS  . insulin glargine  20 Units Subcutaneous QHS  . iopamidol      .  isosorbide mononitrate  30 mg Oral Daily  . pravastatin  40 mg Oral q1800  . sertraline  50 mg Oral Daily  . sodium chloride flush  3 mL Intravenous Q12H  . sodium chloride flush  3 mL Intravenous Q12H   Continuous Infusions: . sodium chloride    . ceFEPime (MAXIPIME) IV 2 g (02/14/18 0612)  . vancomycin Stopped (02/14/18 0209)   PRN Meds:.sodium chloride, acetaminophen **OR** acetaminophen, albuterol, bisacodyl, hydrALAZINE, HYDROcodone-acetaminophen, hydrOXYzine, morphine injection, ondansetron **OR** ondansetron (ZOFRAN) IV, sodium chloride flush, traZODone   Antibiotics   Anti-infectives (From admission, onward)   Start     Dose/Rate Route Frequency Ordered Stop   02/14/18 1200  emtricitabine-rilpivir-tenofovir AF (ODEFSEY) 200-25-25 MG per tablet 1 tablet    Note to Pharmacy:  For HIV infection     1 tablet Oral Daily with breakfast 02/14/18 0104     02/14/18 0600  ceFEPIme (MAXIPIME) 2 g in sodium chloride 0.9 % 100 mL IVPB     2 g 200 mL/hr over 30 Minutes Intravenous Every 24 hours 02/14/18 0104 02/22/18 0559   02/14/18 0100  vancomycin (VANCOCIN) IVPB 1000 mg/200 mL premix     1,000 mg 200 mL/hr over 60 Minutes Intravenous Every 24 hours 02/14/18 0048     02/14/18 0100  piperacillin-tazobactam (ZOSYN) IVPB 3.375 g  Status:  Discontinued     3.375 g 12.5 mL/hr over 240 Minutes Intravenous Every 8 hours 02/14/18 0048 02/14/18 0104   02/14/18 0030  cefTRIAXone (ROCEPHIN) 1 g in sodium chloride 0.9 % 100 mL IVPB  Status:  Discontinued     1 g 200 mL/hr over 30 Minutes Intravenous  Once 02/14/18 0019 02/14/18 0043   02/14/18 0030  azithromycin (ZITHROMAX) 500 mg in sodium chloride 0.9 % 250 mL IVPB  Status:  Discontinued     500 mg 250 mL/hr over 60 Minutes Intravenous  Once 02/14/18 0019 02/14/18 0043        Subjective:   Michael [name] was seen and examined today.  Patient denies dizziness, chest pain, no fever or chills Objective:   Vitals:   02/14/18 0030  02/14/18 0130 02/14/18 0200 02/14/18 0334  BP: (!) 170/88 (!) 144/83 (!) 148/88 (!) 158/101  Pulse: 88 99 88 90  Resp: (!) 22 (!) 22 17 20   Temp:    98.3 F (36.8 C)  TempSrc:    Oral  SpO2: 99% 96% 96% 100%  Weight:    77.8 kg (  171 lb 8.3 oz)  Height:        Intake/Output Summary (Last 24 hours) at 02/14/2018 0752 Last data filed at 02/14/2018 9528 Gross per 24 hour  Intake 300 ml  Output 100 ml  Net 200 ml     Wt Readings from Last 3 Encounters:  02/14/18 77.8 kg (171 lb 8.3 oz)  11/18/17 84.4 kg (186 lb)  10/22/17 85.3 kg (188 lb)     Exam  General: NAD  HEENT: NCAT,  PERRL,MMM  Neck: SUPPLE, (-) JVD  Cardiovascular: RRR, (-) GALLOP, (-) MURMUR  Respiratory: few occ rhonchi  Gastrointestinal: SOFT, (-) DISTENSION, BS(+), (_) TENDERNESS  Ext: (-) CYANOSIS, (-) EDEMA  Neuro: A, OX 3  Skin:(-) RASH     Data Reviewed:  I have personally reviewed following labs and imaging studies  Micro Results No results found for this or any previous visit (from the past 240 hour(s)).  Radiology Reports Ct Angio Chest Pe W And/or Wo Contrast  Result Date: 02/13/2018 CLINICAL DATA:  67 year old male with shortness of breath. EXAM: CT ANGIOGRAPHY CHEST WITH CONTRAST TECHNIQUE: Multidetector CT imaging of the chest was performed using the standard protocol during bolus administration of intravenous contrast. Multiplanar CT image reconstructions and MIPs were obtained to evaluate the vascular anatomy. CONTRAST:  157mL ISOVUE-370 IOPAMIDOL (ISOVUE-370) INJECTION 76% COMPARISON:  Chest radiograph dated 04/01/2017 and CT dated 04/02/2017 FINDINGS: Cardiovascular: There is no cardiomegaly or pericardial effusion. Multi vessel coronary vascular calcification and postsurgical changes of CABG. Mild anterior mediastinal/retrosternal stranding related to recent surgery. No fluid collection. There is mild atherosclerotic calcification of the thoracic aorta. No aneurysmal dilatation or  dissection. Evaluation of the pulmonary arteries is limited due to respiratory motion artifact. No pulmonary artery embolus identified. Mediastinum/Nodes: There is no hilar or mediastinal adenopathy. Esophagus and the thyroid gland are grossly unremarkable. No mediastinal fluid collection. Lungs/Pleura: There is an area of consolidative change with air bronchogram in the right lower lobe. Focal area of hazy and ground-glass density in the medial basal segment of the left lower lobe noted which may represent atelectasis/scarring versus infiltrate although an area of infarct is not excluded. There is no pleural effusion or pneumothorax. The central airways are patent. Upper Abdomen: No acute abnormality. Musculoskeletal: Median sternotomy wires. Degenerative changes of the spine. No acute osseous pathology Review of the MIP images confirms the above findings. IMPRESSION: 1. No CT evidence of pulmonary embolism. 2. Consolidative changes of the right lower lobe most consistent with pneumonia. Clinical correlation and follow-up to resolution recommended. Smaller focal ground-glass density at the left lung base may also represent infection. 3. Multi vessel coronary vascular calcification and postsurgical changes of CABG. No mediastinal fluid collection. Electronically Signed   By: Anner Crete M.D.   On: 02/13/2018 23:28    Lab Data:  CBC: Recent Labs  Lab 02/13/18 2117 02/14/18 0530  WBC 7.0 6.7  NEUTROABS  --  4.4  HGB 11.4* 10.9*  HCT 35.4* 34.0*  MCV 81.0 81.5  PLT 331 413   Basic Metabolic Panel: Recent Labs  Lab 02/13/18 2117 02/14/18 0530  NA 133* 135  K 4.4 4.6  CL 102 101  CO2 22 25  GLUCOSE 220* 192*  BUN 24* 20  CREATININE 1.85* 1.80*  CALCIUM 8.7* 8.8*   GFR: Estimated Creatinine Clearance: 38.5 mL/min (A) (by C-G formula based on SCr of 1.8 mg/dL (H)). Liver Function Tests: Recent Labs  Lab 02/13/18 2117  AST 22  ALT 15*  ALKPHOS 90  BILITOT 0.6  PROT 7.5  ALBUMIN  3.0*   No results for input(s): LIPASE, AMYLASE in the last 168 hours. No results for input(s): AMMONIA in the last 168 hours. Coagulation Profile: No results for input(s): INR, PROTIME in the last 168 hours. Cardiac Enzymes: Recent Labs  Lab 02/14/18 0530  TROPONINI 0.21*   BNP (last 3 results) No results for input(s): PROBNP in the last 8760 hours. HbA1C: No results for input(s): HGBA1C in the last 72 hours. CBG: Recent Labs  Lab 02/14/18 0155  GLUCAP 250*   Lipid Profile: No results for input(s): CHOL, HDL, LDLCALC, TRIG, CHOLHDL, LDLDIRECT in the last 72 hours. Thyroid Function Tests: No results for input(s): TSH, T4TOTAL, FREET4, T3FREE, THYROIDAB in the last 72 hours. Anemia Panel: No results for input(s): VITAMINB12, FOLATE, FERRITIN, TIBC, IRON, RETICCTPCT in the last 72 hours. Urine analysis:    Component Value Date/Time   COLORURINE STRAW (A) 03/12/2016 0453   APPEARANCEUR CLEAR 03/12/2016 0453   LABSPEC 1.027 03/12/2016 0453   PHURINE 5.0 03/12/2016 0453   GLUCOSEU >1000 (A) 03/12/2016 0453   HGBUR NEGATIVE 03/12/2016 0453   BILIRUBINUR NEGATIVE 03/12/2016 0453   KETONESUR 15 (A) 03/12/2016 0453   PROTEINUR NEGATIVE 03/12/2016 0453   UROBILINOGEN 0.2 08/13/2015 1030   NITRITE NEGATIVE 03/12/2016 0453   LEUKOCYTESUR NEGATIVE 03/12/2016 0453     Benito Mccreedy M.D. [facility] 02/14/2018, 7:52 AM  Pager: [phone] Between 7am to 7pm - call Pager - [phone]  After 7pm go to [email or web address] - password [email or web address]  Call night coverage person covering after 7pm

## 2018-02-14 NOTE — H&P (Signed)
History and Physical    Michael [name] CWC:[id number] DOB: 25-Feb-1951 DOA: 02/13/2018  PCP: [name], PA-C   Patient coming from: SNF   Chief Complaint: Right shoulder and right back pain, nausa  HPI: Michael [name] is a 67 y.o. male with medical history significant for cocaine dependence in remission, HIV (CD4 1 in April '19 and VL undetectable in Jan '19), depression with anxiety, atrial fibrillation, hypertension, and coronary artery disease status post CABG with MAZE procedure on 01/29/2018, now presenting from his SNF for evaluation of pain in the right shoulder and right back.  Patient was discharged from the hospital on 02/06/2018 after undergoing a three-vessel CABG on 01/29/2018, had been convalescing at SNF, experiencing mild nausea without vomiting since hospital discharge, but had otherwise been well until the past day when he began to experience mild dyspnea with pleuritic pain in the right side of his upper back and shoulder.  He has been trying not to cough due to resulting pain.  He denies fevers or chills and denies chest pain.  Reports that his surgical incisions are healing well.  ED Course: Upon arrival to the ED, patient is found to be afebrile, saturating adequately on room air, slightly tachypneic, and mildly hypertensive.  EKG features a normal sinus rhythm.  Chemistry panel is notable for sodium 133, glucose 220, and creatinine of 1.85, up from 1.50 a week earlier.  CBC is notable for mild normocytic anemia with hemoglobin of 11.4.  Troponin is elevated to 0.23, downtrending to 0.20 few hours later.  CTA chest is negative for PE, but notable for a right lower lobe consolidation, likely reflecting pneumonia.  Cultures were collected, fentanyl administered, and the patient was started on empiric antibiotics.  He remains hemodynamically stable, is not in any acute distress, and will be admitted to the telemetry unit for ongoing evaluation and management of  HCAP.  Review of Systems:  All other systems reviewed and apart from HPI, are negative.  Past Medical History:  Diagnosis Date  . A-fib ([place])   . Asthma    No PFTs, history of childhood asthma  . CAD (coronary artery disease)    a. 06/2013 STEMI/PCI ([facility]): LAD w/ thrombus (treated with BMS), mid 75%, D2 75%; LCX OM2 75%; RCA small, PDA 95%, PLV 95%;  b. 10/2013 Cath/PCI: ISR w/in LAD (Promus DES x 2), borderline OM2 lesion;  c. 01/2014 MV: Intermediate risk, medium-sized distal ant wall infarct w/ very small amt of peri-infarct ischemia. EF 60%.  . Cellulitis 04/2014   left facial  . Chondromalacia of medial femoral condyle    Left knee MRI 04/28/12: Chondromalacia of the medial femoral condyle with slight peripheral degeneration of the meniscocapsular junction of the medial meniscus; followed by sports medicine  . Collagen vascular disease ([place])   . Crack cocaine use    for 20+ years, has been enrolled in detox programs in the past  . Depression    with history of hospitalization for suicidal ideation  . Diabetes mellitus 2002   Diagnosed in 2002, started insulin in 2012  . Gout   . Gout 04/28/2012  . Headache(784.0)    CT head 08/2011: Periventricular and subcortical white matter hypodensities are most in keeping with chronic microangiopathic change  . HIV infection [facility]) Nov 2012   Followed by [name]  . Hyperlipidemia   . Hypertension   . Pulmonary embolism [facility])     Past Surgical History:  Procedure Laterality Date  . BACK SURGERY  1988  . BOWEL RESECTION    . CARDIAC SURGERY    . CERVICAL SPINE SURGERY     " rods in my neck "  . CORONARY STENT PLACEMENT    . NM MYOCAR PERF WALL MOTION  12/27/2011   normal  . SPINE SURGERY       reports that he has never smoked. He has never used smokeless tobacco. He reports that he has current or past drug history. Drugs: "Crack" cocaine and Cocaine. He reports that he does not drink alcohol.  No Known Allergies  Family  History  Problem Relation Age of Onset  . Diabetes Mother   . Hypertension Mother   . Hyperlipidemia Mother   . Diabetes Father   . Cancer Father   . Hypertension Father   . Diabetes Brother   . Heart disease Brother   . Diabetes Sister   . Colon cancer Neg Hx      Prior to Admission medications   Medication Sig Start Date End Date Taking? Authorizing Provider  ACCU-CHEK SOFTCLIX LANCETS lancets USE TO CHECK BLOOD GLUCOSE LEVELS UP TO 4 TIMES DAILY AS DIRECTED 11/24/17  Yes Nwoko, Herbert Pun I, NP  albuterol (PROAIR HFA) 108 (90 Base) MCG/ACT inhaler INHALE TWO PUFFS INTO THE LUNGS EVERY 6 (SIX) HOURS AS NEEDED FOR WHEEZING OR SHORTNESS OF BREATH. 11/24/17  Yes Lindell Spar I, NP  allopurinol (ZYLOPRIM) 300 MG tablet Take 1 tablet (300 mg total) by mouth daily. For gout. 11/25/17  Yes Lindell Spar I, NP  aspirin 81 MG chewable tablet Chew 81 mg by mouth daily.   Yes [provider]  diltiazem (CARDIZEM CD) 120 MG 24 hr capsule Take 1 capsule (120 mg total) by mouth daily. For high blood pressure 11/25/17  Yes Lindell Spar I, NP  emtricitabine-rilpivir-tenofovir AF (ODEFSEY) 200-25-25 MG TABS tablet Take 1 tablet by mouth daily with breakfast. For HIV infection 11/24/17  Yes Nwoko, Herbert Pun I, NP  famotidine (PEPCID) 20 MG tablet Take 1 tablet (20 mg total) by mouth daily. For acid reflux 11/24/17  Yes Lindell Spar I, NP  gabapentin (NEURONTIN) 400 MG capsule Take 1 capsule (400 mg total) by mouth 2 (two) times daily. For agitation/neuropathic pain Patient taking differently: Take 400 mg by mouth 2 (two) times daily as needed (For agitation/neuropathic pain).  11/24/17  Yes Lindell Spar I, NP  hydrochlorothiazide (MICROZIDE) 12.5 MG capsule Take 1 capsule (12.5 mg total) by mouth daily. For high blood pressure 11/25/17  Yes Nwoko, Herbert Pun I, NP  hydrOXYzine (ATARAX/VISTARIL) 25 MG tablet Take 1 tablet (25 mg total) by mouth 3 (three) times daily as needed for anxiety. 11/24/17  Yes Lindell Spar I, NP   insulin glargine (LANTUS) 100 UNIT/ML injection Inject 0.3 mLs (30 Units total) into the skin at bedtime. For diabetes management 11/24/17  Yes Lindell Spar I, NP  insulin lispro (HUMALOG) 100 UNIT/ML injection Inject 0.05 mLs (5 Units total) into the skin 3 (three) times daily with meals. For diabetes management Patient taking differently: Inject 4 Units into the skin 3 (three) times daily with meals. For diabetes management 11/24/17  Yes Lindell Spar I, NP  Insulin Syringe-Needle U-100 (B-D INS SYR MICROFINE 1CC/27G) 27G X 5/8" 1 ML MISC 1 strip by Does not apply route 3 (three) times daily as needed. For blood sugar checks 11/24/17  Yes Nwoko, Herbert Pun I, NP  isosorbide mononitrate (IMDUR) 30 MG 24 hr tablet Take 1 tablet (30 mg total) by mouth daily. For chest 11/25/17  Yes Lindell Spar I, NP  losartan (COZAAR) 50 MG tablet Take 1 tablet (50 mg total) by mouth daily. For high blood pressure 11/25/17  Yes Nwoko, Herbert Pun I, NP  metFORMIN (GLUCOPHAGE) 1000 MG tablet Take 1 tablet (1,000 mg total) by mouth 2 (two) times daily with a meal. For diabetes management 11/24/17  Yes Lindell Spar I, NP  pravastatin (PRAVACHOL) 40 MG tablet Take 1 tablet (40 mg total) by mouth at bedtime. For high cholesterol 11/24/17  Yes Lindell Spar I, NP  sertraline (ZOLOFT) 50 MG tablet Take 1 tablet (50 mg total) by mouth daily. For depression 11/25/17  Yes Lindell Spar I, NP  traZODone (DESYREL) 50 MG tablet Take 1 tablet (50 mg total) by mouth at bedtime as needed for sleep. 11/24/17  Yes Lindell Spar I, NP    Physical Exam: Vitals:   02/13/18 2300 02/13/18 2330 02/14/18 0020 02/14/18 0030  BP: (!) 141/85 140/73 (!) 162/94 (!) 170/88  Pulse:  86 87 88  Resp:  18 (!) 21 (!) 22  Temp:      TempSrc:      SpO2:  99% 98% 99%  Weight:      Height:          Constitutional: NAD, appears uncomfortable  Eyes: PERTLA, lids and conjunctivae normal ENMT: Mucous membranes are moist. Posterior pharynx clear of any exudate or  lesions.   Neck: normal, supple, no masses, no thyromegaly Respiratory: Mild tachypnea, speaking full sentences. Rhonchi on right side. No accessory muscle use.  Cardiovascular: S1 & S2 heard, regular rate and rhythm. No extremity edema. No significant JVD. Abdomen: No distension, no tenderness, soft. Bowel sounds normal.  Musculoskeletal: no clubbing / cyanosis. No joint deformity upper and lower extremities.  Skin: Sternal and distal LLE surgical incisions c/d/i. Warm, dry, well-perfused. Neurologic: CN 2-12 grossly intact. Sensation intact. Strength 5/5 in all 4 limbs.  Psychiatric: Alert and oriented x 3. Tearful. cooperative.     Labs on Admission: I have personally reviewed following labs and imaging studies  CBC: Recent Labs  Lab 02/13/18 2117  WBC 7.0  HGB 11.4*  HCT 35.4*  MCV 81.0  PLT 001   Basic Metabolic Panel: Recent Labs  Lab 02/13/18 2117  NA 133*  K 4.4  CL 102  CO2 22  GLUCOSE 220*  BUN 24*  CREATININE 1.85*  CALCIUM 8.7*   GFR: Estimated Creatinine Clearance: 37.5 mL/min (A) (by C-G formula based on SCr of 1.85 mg/dL (H)). Liver Function Tests: Recent Labs  Lab 02/13/18 2117  AST 22  ALT 15*  ALKPHOS 90  BILITOT 0.6  PROT 7.5  ALBUMIN 3.0*   No results for input(s): LIPASE, AMYLASE in the last 168 hours. No results for input(s): AMMONIA in the last 168 hours. Coagulation Profile: No results for input(s): INR, PROTIME in the last 168 hours. Cardiac Enzymes: No results for input(s): CKTOTAL, CKMB, CKMBINDEX, TROPONINI in the last 168 hours. BNP (last 3 results) No results for input(s): PROBNP in the last 8760 hours. HbA1C: No results for input(s): HGBA1C in the last 72 hours. CBG: No results for input(s): GLUCAP in the last 168 hours. Lipid Profile: No results for input(s): CHOL, HDL, LDLCALC, TRIG, CHOLHDL, LDLDIRECT in the last 72 hours. Thyroid Function Tests: No results for input(s): TSH, T4TOTAL, FREET4, T3FREE, THYROIDAB in the  last 72 hours. Anemia Panel: No results for input(s): VITAMINB12, FOLATE, FERRITIN, TIBC, IRON, RETICCTPCT in the last 72 hours. Urine analysis:    Component Value Date/Time  COLORURINE STRAW (A) 03/12/2016 [place] 03/12/2016 0453   LABSPEC 1.027 03/12/2016 0453   PHURINE 5.0 03/12/2016 0453   GLUCOSEU >1000 (A) 03/12/2016 0453   HGBUR NEGATIVE 03/12/2016 0453   BILIRUBINUR NEGATIVE 03/12/2016 0453   KETONESUR 15 (A) 03/12/2016 0453   PROTEINUR NEGATIVE 03/12/2016 0453   UROBILINOGEN 0.2 08/13/2015 1030   NITRITE NEGATIVE 03/12/2016 0453   LEUKOCYTESUR NEGATIVE 03/12/2016 0453   Sepsis Labs: @LABRCNTIP (procalcitonin:4,lacticidven:4) )No results found for this or any previous visit (from the past 240 hour(s)).   Radiological Exams on Admission: Ct Angio Chest Pe W And/or Wo Contrast  Result Date: 02/13/2018 CLINICAL DATA:  67 year old male with shortness of breath. EXAM: CT ANGIOGRAPHY CHEST WITH CONTRAST TECHNIQUE: Multidetector CT imaging of the chest was performed using the standard protocol during bolus administration of intravenous contrast. Multiplanar CT image reconstructions and MIPs were obtained to evaluate the vascular anatomy. CONTRAST:  163mL ISOVUE-370 IOPAMIDOL (ISOVUE-370) INJECTION 76% COMPARISON:  Chest radiograph dated 04/01/2017 and CT dated 04/02/2017 FINDINGS: Cardiovascular: There is no cardiomegaly or pericardial effusion. Multi vessel coronary vascular calcification and postsurgical changes of CABG. Mild anterior mediastinal/retrosternal stranding related to recent surgery. No fluid collection. There is mild atherosclerotic calcification of the thoracic aorta. No aneurysmal dilatation or dissection. Evaluation of the pulmonary arteries is limited due to respiratory motion artifact. No pulmonary artery embolus identified. Mediastinum/Nodes: There is no hilar or mediastinal adenopathy. Esophagus and the thyroid gland are grossly unremarkable. No  mediastinal fluid collection. Lungs/Pleura: There is an area of consolidative change with air bronchogram in the right lower lobe. Focal area of hazy and ground-glass density in the medial basal segment of the left lower lobe noted which may represent atelectasis/scarring versus infiltrate although an area of infarct is not excluded. There is no pleural effusion or pneumothorax. The central airways are patent. Upper Abdomen: No acute abnormality. Musculoskeletal: Median sternotomy wires. Degenerative changes of the spine. No acute osseous pathology Review of the MIP images confirms the above findings. IMPRESSION: 1. No CT evidence of pulmonary embolism. 2. Consolidative changes of the right lower lobe most consistent with pneumonia. Clinical correlation and follow-up to resolution recommended. Smaller focal ground-glass density at the left lung base may also represent infection. 3. Multi vessel coronary vascular calcification and postsurgical changes of CABG. No mediastinal fluid collection. Electronically Signed   By: Anner Crete M.D.   On: 02/13/2018 23:28    EKG: Independently reviewed. Normal sinus rhythm.   Assessment/Plan  1. HCAP   - Presents with pleuritic pain, mild dyspnea and cough, right shoulder and back pain - CTA chest negative for PE but concerning for right-sided PNA  - Collect blood and sputum cultures, check strep pneumo antigen and procalcitonin  - Start vancomycin and cefepime given recent hospitalization and current residence in SNF  - Continue supplemental O2 prn, follow cultures and clinical course    2. CAD s/p CABG on 4/25; elevated troponin  - No anginal complaints  - Status-post 3-vessel CABG on 01/29/18  - EKG without acute ischemic features, troponin elevated to 0.23 and downtrending  - Continue ASA, statin, nitrates; resume ARB when renal fxn stabilizes    3. Acute kidney injury superimposed on CKD stage III  - SCr is 1.85 on admission, up from 1.50 a week  earlier  - Hold losartan and HCTZ, renally-dose medications, avoid nephrotoxins, repeat chemistry panel in am    4. Insulin-dependent DM  - A1c was 10.6% a yr ago  - Managed at  home with Lantus 30 units qHS and Humalog 5 units TID  - Continue Lantus with 20 units qHS to start plus SSI with Novolog   5. HIV  - CD4 440 last month; VL undetectable in January 2019   - Continue Odefsey   6. Hypertension - Losartan and HCTZ held for AKI, use IV hydralazine IVP's prn for now  7. Depression with anxiety  - Continue Zoloft, trazodone, prn Vistaril   8. Hx atrial fibrillation  - Status-post MAZE procedure on 01/29/18  - In sinus rhythm on admission    DVT prophylaxis: sq heparin Code Status: Full  Family Communication: Discussed with patient Consults called: None Admission status: Inpatient    Vianne Bulls, MD [facility] Pager [phone]  If 7PM-7AM, please contact night-coverage [email or web address] Password [email or web address]  02/14/2018, 1:09 AM

## 2018-02-14 NOTE — ED Notes (Signed)
Attempted report x 1. Stated the Charge has not approved this or assigned this to a nurse yet.

## 2018-02-14 NOTE — Progress Notes (Addendum)
CRITICAL VALUE ALERT  Critical Value:  Troponin 0.21  Date & Time Notied:  02/14/2018   0725  Provider Notified: [facility] contacted via [facility] text message.  Orders Received/Actions taken: pending

## 2018-02-14 NOTE — Progress Notes (Signed)
Pharmacy Antibiotic Note  Michael [name] is a 67 y.o. male admitted on 02/13/2018 with pneumonia.  Pharmacy has been consulted for Vancomycin/Zosyn dosing. From nursing facility. CT with likely PNA. WBC WNL. Noted renal dysfunction.   Plan: Vancomycin 1000 mg IV q24h Zosyn 3.375G IV q8h to be infused over 4 hours Trend WBC, temp, renal function  F/U infectious work-up Drug levels as indicated   Height: 5\' 8"  (172.7 cm) Weight: 167 lb (75.8 kg) IBW/kg (Calculated) : 68.4  Temp (24hrs), Avg:98.3 F (36.8 C), Min:98.3 F (36.8 C), Max:98.3 F (36.8 C)  Recent Labs  Lab 02/13/18 2117  WBC 7.0  CREATININE 1.85*    Estimated Creatinine Clearance: 37.5 mL/min (A) (by C-G formula based on SCr of 1.85 mg/dL (H)).    No Known Allergies  Michael, [name] 02/14/2018 12:46 AM

## 2018-02-15 ENCOUNTER — Inpatient Hospital Stay (HOSPITAL_COMMUNITY): Payer: Medicare Other

## 2018-02-15 LAB — GLUCOSE, CAPILLARY
GLUCOSE-CAPILLARY: 183 mg/dL — AB (ref 65–99)
GLUCOSE-CAPILLARY: 222 mg/dL — AB (ref 65–99)
GLUCOSE-CAPILLARY: 293 mg/dL — AB (ref 65–99)
Glucose-Capillary: 271 mg/dL — ABNORMAL HIGH (ref 65–99)

## 2018-02-15 LAB — TROPONIN I: TROPONIN I: 0.2 ng/mL — AB (ref <0.03)

## 2018-02-15 NOTE — Progress Notes (Signed)
[facility]                                                                              Patient Demographics  Michael [name], is a 67 y.o. male, DOB - 08-03-1951, TAV:[id number]  Admit date - 02/13/2018   Admitting Physician Vianne Bulls, MD  Outpatient Primary MD for the patient is [name], PA-C  Outpatient specialists:   LOS - 1  days    Chief Complaint  Patient presents with  . Shoulder Pain       Brief summary  Michael [name] is a 67 y.o. male with medical history significant for cocaine dependence in remission, HIV (CD4 29 in April '19 and VL undetectable in Jan '19), depression with anxiety, atrial fibrillation, hypertension, and coronary artery disease status post CABG with MAZE procedure on 01/29/2018, who presents from SNF with pain in the right shoulder/ right back and cough with chills without fever.  At the emergency room patient was noted to be tachycardic without acute EKG changes despite mild elevated troponin levels, and CT angiogram was negative for PE but positive for right lower lobe consolidation and admitted for healthcare acquired pneumonia.  Assessment & Plan    Principal Problem:   HCAP (healthcare-associated pneumonia) Active Problems:   HIV (human immunodeficiency virus infection) ([place])   Uncontrolled type 2 diabetes with neuropathy ([place])   Hypertension goal BP (blood pressure) < 140/80   3-vessel CAD   Severe recurrent major depressive disorder with psychotic features ([name])   CKD (chronic kidney disease) stage 3, GFR 30-59 ml/min ([name])   AKI (acute kidney injury) ([place])   . HCAP   - Presents with pleuritic pain, mild dyspnea and cough, right shoulder and back pain - CTA chest negative for PE but concerning for right-sided PNA  - f/ut blood and sputum cultures,strep pneumo antigen and procalcitonin  - cont vancomycin and cefepime given recent hospitalization and current residence in SNF  - Continue supplemental  O2 prn  2. CAD s/p CABG on 4/25; elevated troponin  - No anginal complaints  - Status-post 3-vessel CABG on 01/29/18  - EKG without acute ischemic features, troponin elevated to 0.23 and downtrending  - Continue ASA, statin, nitrates; resume ARB when renal fxn stabilizes    3. Acute kidney injury superimposed on CKD stage III  - SCr is 1.85 on admission, up from 1.50 a week earlier  - Hold losartan and HCTZ, renally-dose medications, avoid nephrotoxins, repeat chemistry panel in am    4. Insulin-dependent DM  - A1c was 10.6% a yr ago  - Managed at home with Lantus 30 units qHS and Humalog 5 units TID  - Continue Lantus with 20 units qHS to start plus SSI with Novolog   5. HIV  - CD4 440 last month; VL undetectable in January 2019   - Continue Odefsey   6. Hypertension -BP had been good except an isolated check x 1 today- monitor and adjust as need - Losartan and HCTZ held for AKI, use IV hydralazine IVP's prn for now  7. Depression with anxiety  - Continue Zoloft, trazodone, prn Vistaril  8. Hx atrial fibrillation  - Status-post MAZE procedure on 01/29/18  - In sinus rhythm on admission   9. Right Shoulder Pain -Supportive care -Check x-ray   DVT prophylaxis: sq heparin Code Status: Full  Family Communication: Discussed with patient Consults called: None     Time Spent in minutes  25 minutes  Procedures:    Consultants:     Antimicrobials:      Medications  Scheduled Meds: . allopurinol  300 mg Oral Daily  . aspirin  81 mg Oral Daily  . diltiazem  120 mg Oral Daily  . emtricitabine-rilpivir-tenofovir AF  1 tablet Oral Q breakfast  . famotidine  20 mg Oral Daily  . feeding supplement (GLUCERNA SHAKE)  237 mL Oral Daily  . gabapentin  400 mg Oral BID  . heparin  5,000 Units Subcutaneous Q8H  . insulin aspart  0-15 Units Subcutaneous TID WC  . insulin aspart  0-5 Units Subcutaneous QHS  . insulin glargine  20 Units Subcutaneous QHS  .  isosorbide mononitrate  30 mg Oral Daily  . pravastatin  40 mg Oral q1800  . protein supplement shake  11 oz Oral Q1500  . sertraline  50 mg Oral Daily  . sodium chloride flush  3 mL Intravenous Q12H  . sodium chloride flush  3 mL Intravenous Q12H   Continuous Infusions: . sodium chloride    . ceFEPime (MAXIPIME) IV Stopped (02/15/18 4163)  . vancomycin Stopped (02/15/18 0146)   PRN Meds:.sodium chloride, acetaminophen **OR** acetaminophen, albuterol, bisacodyl, hydrALAZINE, HYDROcodone-acetaminophen, hydrOXYzine, morphine injection, ondansetron **OR** ondansetron (ZOFRAN) IV, sodium chloride flush, traZODone   Antibiotics   Anti-infectives (From admission, onward)   Start     Dose/Rate Route Frequency Ordered Stop   02/14/18 1200  emtricitabine-rilpivir-tenofovir AF (ODEFSEY) 200-25-25 MG per tablet 1 tablet    Note to Pharmacy:  For HIV infection     1 tablet Oral Daily with breakfast 02/14/18 0104     02/14/18 0600  ceFEPIme (MAXIPIME) 2 g in sodium chloride 0.9 % 100 mL IVPB     2 g 200 mL/hr over 30 Minutes Intravenous Every 24 hours 02/14/18 0104 02/22/18 0559   02/14/18 0100  vancomycin (VANCOCIN) IVPB 1000 mg/200 mL premix     1,000 mg 200 mL/hr over 60 Minutes Intravenous Every 24 hours 02/14/18 0048     02/14/18 0100  piperacillin-tazobactam (ZOSYN) IVPB 3.375 g  Status:  Discontinued     3.375 g 12.5 mL/hr over 240 Minutes Intravenous Every 8 hours 02/14/18 0048 02/14/18 0104   02/14/18 0030  cefTRIAXone (ROCEPHIN) 1 g in sodium chloride 0.9 % 100 mL IVPB  Status:  Discontinued     1 g 200 mL/hr over 30 Minutes Intravenous  Once 02/14/18 0019 02/14/18 0043   02/14/18 0030  azithromycin (ZITHROMAX) 500 mg in sodium chloride 0.9 % 250 mL IVPB  Status:  Discontinued     500 mg 250 mL/hr over 60 Minutes Intravenous  Once 02/14/18 0019 02/14/18 0043        Subjective:   Michael [name] was seen and examined today.  Patient c/o right shoulder pain Objective:    Vitals:   02/14/18 0754 02/14/18 1615 02/14/18 2342 02/15/18 0752  BP: 135/83 118/74 108/78 (!) 179/155  Pulse: 86 88 99 98  Resp:  18 15 18   Temp: 98.1 F (36.7 C) 97.8 F (36.6 C) 98.3 F (36.8 C) 97.7 F (36.5 C)  TempSrc: Oral Oral Oral Oral  SpO2: 100% 100% 99% 99%  Weight:      Height:        Intake/Output Summary (Last 24 hours) at 02/15/2018 1237 Last data filed at 02/15/2018 1227 Gross per 24 hour  Intake 443 ml  Output 1075 ml  Net -632 ml     Wt Readings from Last 3 Encounters:  02/14/18 77.8 kg (171 lb 8.3 oz)  11/18/17 84.4 kg (186 lb)  10/22/17 85.3 kg (188 lb)     Exam  General: NAD  HEENT: NCAT,  PERRL,MMM  Neck: SUPPLE, (-) JVD  Cardiovascular: RRR, (-) GALLOP, (-) MURMUR  Respiratory: few occ rhonchi  Gastrointestinal: SOFT, (-) DISTENSION, BS(+), (_) TENDERNESS  Ext: (-) CYANOSIS, (-) EDEMA  Neuro: A, OX 3  Skin:(-) RASH     Data Reviewed:  I have personally reviewed following labs and imaging studies  Micro Results Recent Results (from the past 240 hour(s))  Culture, blood (routine x 2)     Status: None (Preliminary result)   Collection Time: 02/14/18 12:15 AM  Result Value Ref Range Status   Specimen Description BLOOD LEFT ANTECUBITAL  Final   Special Requests   Final    BOTTLES DRAWN AEROBIC AND ANAEROBIC Blood Culture adequate volume   Culture   Final    NO GROWTH 1 DAY Performed at [facility], [address], [place], [place] [address]    Report Status PENDING  Incomplete  Culture, blood (routine x 2)     Status: None (Preliminary result)   Collection Time: 02/14/18 12:18 AM  Result Value Ref Range Status   Specimen Description BLOOD RIGHT ANTECUBITAL  Final   Special Requests   Final    BOTTLES DRAWN AEROBIC AND ANAEROBIC Blood Culture adequate volume   Culture   Final    NO GROWTH 1 DAY Performed at [facility], [place] [name], [place], [place] [address]    Report Status PENDING  Incomplete     Radiology Reports Ct Angio Chest Pe W And/or Wo Contrast  Result Date: 02/13/2018 CLINICAL DATA:  67 year old male with shortness of breath. EXAM: CT ANGIOGRAPHY CHEST WITH CONTRAST TECHNIQUE: Multidetector CT imaging of the chest was performed using the standard protocol during bolus administration of intravenous contrast. Multiplanar CT image reconstructions and MIPs were obtained to evaluate the vascular anatomy. CONTRAST:  152mL ISOVUE-370 IOPAMIDOL (ISOVUE-370) INJECTION 76% COMPARISON:  Chest radiograph dated 04/01/2017 and CT dated 04/02/2017 FINDINGS: Cardiovascular: There is no cardiomegaly or pericardial effusion. Multi vessel coronary vascular calcification and postsurgical changes of CABG. Mild anterior mediastinal/retrosternal stranding related to recent surgery. No fluid collection. There is mild atherosclerotic calcification of the thoracic aorta. No aneurysmal dilatation or dissection. Evaluation of the pulmonary arteries is limited due to respiratory motion artifact. No pulmonary artery embolus identified. Mediastinum/Nodes: There is no hilar or mediastinal adenopathy. Esophagus and the thyroid gland are grossly unremarkable. No mediastinal fluid collection. Lungs/Pleura: There is an area of consolidative change with air bronchogram in the right lower lobe. Focal area of hazy and ground-glass density in the medial basal segment of the left lower lobe noted which may represent atelectasis/scarring versus infiltrate although an area of infarct is not excluded. There is no pleural effusion or pneumothorax. The central airways are patent. Upper Abdomen: No acute abnormality. Musculoskeletal: Median sternotomy wires. Degenerative changes of the spine. No acute osseous pathology Review of the MIP images confirms the above findings. IMPRESSION: 1. No CT evidence of pulmonary embolism. 2. Consolidative changes of the right lower lobe most consistent with  pneumonia. Clinical correlation and  follow-up to resolution recommended. Smaller focal ground-glass density at the left lung base may also represent infection. 3. Multi vessel coronary vascular calcification and postsurgical changes of CABG. No mediastinal fluid collection. Electronically Signed   By: Anner Crete M.D.   On: 02/13/2018 23:28    Lab Data:  CBC: Recent Labs  Lab 02/13/18 2117 02/14/18 0530  WBC 7.0 6.7  NEUTROABS  --  4.4  HGB 11.4* 10.9*  HCT 35.4* 34.0*  MCV 81.0 81.5  PLT 331 009   Basic Metabolic Panel: Recent Labs  Lab 02/13/18 2117 02/14/18 0530  NA 133* 135  K 4.4 4.6  CL 102 101  CO2 22 25  GLUCOSE 220* 192*  BUN 24* 20  CREATININE 1.85* 1.80*  CALCIUM 8.7* 8.8*   GFR: Estimated Creatinine Clearance: 38.5 mL/min (A) (by C-G formula based on SCr of 1.8 mg/dL (H)). Liver Function Tests: Recent Labs  Lab 02/13/18 2117  AST 22  ALT 15*  ALKPHOS 90  BILITOT 0.6  PROT 7.5  ALBUMIN 3.0*   No results for input(s): LIPASE, AMYLASE in the last 168 hours. No results for input(s): AMMONIA in the last 168 hours. Coagulation Profile: No results for input(s): INR, PROTIME in the last 168 hours. Cardiac Enzymes: Recent Labs  Lab 02/14/18 0530 02/14/18 1744 02/15/18 0728  TROPONINI 0.21* 0.20* 0.20*   BNP (last 3 results) No results for input(s): PROBNP in the last 8760 hours. HbA1C: No results for input(s): HGBA1C in the last 72 hours. CBG: Recent Labs  Lab 02/14/18 1153 02/14/18 1616 02/14/18 2105 02/15/18 0751 02/15/18 1203  GLUCAP 262* 275* 160* 271* 183*   Lipid Profile: No results for input(s): CHOL, HDL, LDLCALC, TRIG, CHOLHDL, LDLDIRECT in the last 72 hours. Thyroid Function Tests: No results for input(s): TSH, T4TOTAL, FREET4, T3FREE, THYROIDAB in the last 72 hours. Anemia Panel: No results for input(s): VITAMINB12, FOLATE, FERRITIN, TIBC, IRON, RETICCTPCT in the last 72 hours. Urine analysis:    Component Value Date/Time   COLORURINE STRAW (A) 03/12/2016  0453   APPEARANCEUR CLEAR 03/12/2016 0453   LABSPEC 1.027 03/12/2016 0453   PHURINE 5.0 03/12/2016 0453   GLUCOSEU >1000 (A) 03/12/2016 0453   HGBUR NEGATIVE 03/12/2016 0453   BILIRUBINUR NEGATIVE 03/12/2016 0453   KETONESUR 15 (A) 03/12/2016 0453   PROTEINUR NEGATIVE 03/12/2016 0453   UROBILINOGEN 0.2 08/13/2015 1030   NITRITE NEGATIVE 03/12/2016 0453   LEUKOCYTESUR NEGATIVE 03/12/2016 0453     Benito Mccreedy M.D. [facility] 02/15/2018, 12:37 PM  Pager: [phone] Between 7am to 7pm - call Pager - [phone]  After 7pm go to [email or web address] - password [email or web address]  Call night coverage person covering after 7pm

## 2018-02-16 DIAGNOSIS — R52 Pain, unspecified: Secondary | ICD-10-CM

## 2018-02-16 LAB — GLUCOSE, CAPILLARY
Glucose-Capillary: 263 mg/dL — ABNORMAL HIGH (ref 65–99)
Glucose-Capillary: 330 mg/dL — ABNORMAL HIGH (ref 65–99)
Glucose-Capillary: 225 mg/dL — ABNORMAL HIGH (ref 65–99)

## 2018-02-16 LAB — TROPONIN I: Troponin I: 0.19 ng/mL

## 2018-02-16 MED ORDER — POLYETHYLENE GLYCOL 3350 17 G PO PACK
17.0000 g | PACK | Freq: Every day | ORAL | Status: DC
  Administered 2018-02-16 – 2018-02-17 (×2): 17 g via ORAL
  Filled 2018-02-16 (×2): qty 1

## 2018-02-16 MED ORDER — SENNOSIDES-DOCUSATE SODIUM 8.6-50 MG PO TABS
1.0000 | ORAL_TABLET | Freq: Two times a day (BID) | ORAL | Status: DC
  Administered 2018-02-16 – 2018-02-17 (×3): 1 via ORAL
  Filled 2018-02-16 (×3): qty 1

## 2018-02-16 NOTE — Evaluation (Signed)
Physical Therapy Evaluation Patient Details Name: Michael [name] MRN: [id number] DOB: Feb 26, 1951 Today's Date: 02/16/2018   History of Present Illness  67 y.o. male with medical history significant for cocaine dependence in remission, HIV (CD4 7 in April '19 and VL undetectable in Jan '19), depression with anxiety, atrial fibrillation, hypertension, and coronary artery disease status post CABG with MAZE procedure on 01/29/2018, now presenting from his SNF for evaluation of pain in the right shoulder and right back.  Patient was discharged from the hospital on 02/06/2018 after undergoing a three-vessel CABG on 01/29/2018, had been convalescing at [facility]. CTA chest is negative for PE, but notable for a right lower lobe consolidation. Presents with HCAP, AKI on CKD III Pt reports CVA since CABG (not found in H&P) and associated L-sided weakness, vision loss and difficulty speaking.   Clinical Impression  PTA pt recovering from 4/25 CABG at SNF able to ambulate approximately 20 feet with min guard and assist for ADLs. Pt is limited in his mobility by increased R shoulder and chest pain with breathing and decreased strength and endurance. Pt requires supervision for bed mobility, and minA for sit>stand and ambulation of 15 feet with RW. Pt with increased SoB and RPE of 14 with ambulation from bed to recliner. PT recommends return to SNF at d/c to continue to build his strength and endurance for eventual d/c home. PT will continue to follow acutely.     Follow Up Recommendations SNF    Equipment Recommendations  None recommended by PT    Recommendations for Other Services OT consult     Precautions / Restrictions Precautions Precautions: Sternal Precaution Comments: s/p 4/25 CABG requires assist to recall precautions Restrictions Weight Bearing Restrictions: No      Mobility  Bed Mobility Overal bed mobility: Needs Assistance Bed Mobility: Supine to Sit     Supine to sit: Supervision      General bed mobility comments: supervision for safety, vc not to pull on bed rail due to sternal precautions  Transfers Overall transfer level: Needs assistance Equipment used: None Transfers: Sit to/from Stand Sit to Stand: Min assist         General transfer comment: min A for power up and steadying while maintaining sternal precautions, increased posterior push of calves on bed to bring himself into standing  Ambulation/Gait Ambulation/Gait assistance: Min assist Ambulation Distance (Feet): 15 Feet Assistive device: Rolling walker (2 wheeled) Gait Pattern/deviations: Step-through pattern;Decreased dorsiflexion - left;Decreased weight shift to left;Shuffle Gait velocity: slowed Gait velocity interpretation: <1.31 ft/sec, indicative of household ambulator General Gait Details: slow, shuffling gait requiring minA for steadying with RW, pt with SoB and RPE of 14 with ambulation to chair       Balance Overall balance assessment: Needs assistance Sitting-balance support: No upper extremity supported;Feet supported Sitting balance-Leahy Scale: Good     Standing balance support: No upper extremity supported Standing balance-Leahy Scale: Poor Standing balance comment: assist of leaning backward with calves into bed                             Pertinent Vitals/Pain Pain Assessment: 0-10 Pain Score: 8  Pain Location: R shoulder with breath, LBP  Pain Descriptors / Indicators: Stabbing Pain Intervention(s): Limited activity within patient's tolerance;Monitored during session;Patient requesting pain meds-RN notified;RN gave pain meds during session    Home Living Family/patient expects to be discharged to:: Skilled nursing facility  Prior Function Level of Independence: Independent;Needs assistance   Gait / Transfers Assistance Needed: min A for ambulation of 15 ft   ADL's / Homemaking Assistance Needed: requires assist for bathing and  dressing  Comments: Prior to CABG 4/25 independent with all ADLs, community ambulator, at SNF ambulating 20 feet, assist with ADLs     Hand Dominance   Dominant Hand: Right    Extremity/Trunk Assessment   Upper Extremity Assessment Upper Extremity Assessment: LUE deficits/detail LUE Deficits / Details: L sided weakness s/p CVA, L UE not formally assessed    Lower Extremity Assessment Lower Extremity Assessment: LLE deficits/detail LLE Deficits / Details: decreased hip strength grossly 2+/5 and resulting decreased AROM, knee and ankle strength grossly 3+/5  LLE Sensation: decreased light touch LLE Coordination: decreased gross motor;decreased fine motor       Communication   Communication: Expressive difficulties(s/p CVA increased time for response)  Cognition Arousal/Alertness: Awake/alert Behavior During Therapy: Flat affect Overall Cognitive Status: Impaired/Different from baseline Area of Impairment: Memory;Problem solving                     Memory: Decreased short-term memory;Decreased recall of precautions       Problem Solving: Slow processing;Requires verbal cues;Requires tactile cues               Assessment/Plan    PT Assessment Patient needs continued PT services  PT Problem List         PT Treatment Interventions DME instruction;Gait training;Functional mobility training;Therapeutic activities;Therapeutic exercise;Balance training;Patient/family education;Cognitive remediation    PT Goals (Current goals can be found in the Care Plan section)  Acute Rehab PT Goals Patient Stated Goal: have less pain with breathing  PT Goal Formulation: With patient Time For Goal Achievement: 03/02/18 Potential to Achieve Goals: Good    Frequency Min 2X/week    AM-PAC PT "6 Clicks" Daily Activity  Outcome Measure Difficulty turning over in bed (including adjusting bedclothes, sheets and blankets)?: A Little Difficulty moving from lying on back to  sitting on the side of the bed? : A Little Difficulty sitting down on and standing up from a chair with arms (e.g., wheelchair, bedside commode, etc,.)?: Unable Help needed moving to and from a bed to chair (including a wheelchair)?: A Little Help needed walking in hospital room?: A Little Help needed climbing 3-5 steps with a railing? : Total 6 Click Score: 14    End of Session Equipment Utilized During Treatment: Gait belt Activity Tolerance: Patient limited by pain;Patient limited by fatigue Patient left: in chair;with call bell/phone within reach;with chair alarm set;with nursing/sitter in room Nurse Communication: Mobility status PT Visit Diagnosis: Unsteadiness on feet (R26.81);Other abnormalities of gait and mobility (R26.89);Muscle weakness (generalized) (M62.81);Difficulty in walking, not elsewhere classified (R26.2);Pain Pain - Right/Left: Right Pain - part of body: Shoulder(back and chest)    Time: 9163-8466 PT Time Calculation (min) (ACUTE ONLY): 21 min   Charges:   PT Evaluation $PT Eval Moderate Complexity: 1 Mod     PT G Codes:        Michael [name] B. Migdalia Dk PT, DPT Acute Rehabilitation  [phone] Pager [phone]    [place] 02/16/2018, 9:31 AM

## 2018-02-16 NOTE — Progress Notes (Signed)
PROGRESS NOTE  Michael [name] QQI:[id number] DOB: 06-14-1951 DOA: 02/13/2018 PCP: [name], PA-C  HPI/Recap of past 24 hours:  Michael [name] a 67 y.o.malewith medical history significant forcocaine dependence in remission, HIV (CD4 60 in April '19 and VL undetectable in Jan '19), depression with anxiety, atrial fibrillation, hypertension, andcoronary artery disease status post CABGwith MAZE procedureon 01/29/2018,who presents from SNF with pain in the right shoulder/ right back and cough with chills without fever.  At the emergency room patient was noted to be tachycardic without acute EKG changes despite mild elevated troponin levels, and CT angiogram was negative for PE but positive for right lower lobe consolidation and admitted for healthcare acquired pneumonia.  02/16/2018: Patient seen and examined at his bedside.  He reports Incisional chest pain which is reproducible on palpation.  Had CABG done at [facility].  Cough is unchanged.  Assessment/Plan: Principal Problem:   HCAP (healthcare-associated pneumonia) Active Problems:   HIV (human immunodeficiency virus infection) ([name])   Uncontrolled type 2 diabetes with neuropathy ([place])   Hypertension goal BP (blood pressure) < 140/80   3-vessel CAD   Severe recurrent major depressive disorder with psychotic features ([place])   CKD (chronic kidney disease) stage 3, GFR 30-59 ml/min ([name])   AKI (acute kidney injury) ([place])  HCAP, POA Recent CABG done at [facility] Continue empiric IV antibiotics IV vancomycin and IV cefepime CTA chest negative for PE  O2 supplementation to maintain O2 saturation 92% or greater Continue breathing treatments  Coronary artery disease status post CABG on 01/29/18 CABG done at [facility] Follow-up outpatient Continue aspirin, statin, nitrates  AKI on CKD 3 Creatinine on admission 1.85 Baseline creatinine 1.5 Avoid nephrotoxic agents/hypotension Repeat BMP in the morning  Elevated  troponin Peaked at 0.21 and trended down to 0.19 Last EKG done on 02/13/2018 revealed sinus rhythm with rate of 87 with no ST-T elevations  Physical debility/ambulatory dysfunction PT evaluated and recommended SNF Fall precautions  Moderate protein calorie malnutrition Albumin 3.0 Encourage increasing oral intake    Code Status: Full code  Family Communication: None at bedside  Disposition Plan: SNF when clinically stable   Consultants:  None  Procedures:  None  Antimicrobials:  IV vancomycin and IV cefepime  DVT prophylaxis: Subcu heparin 3 times daily   Objective: Vitals:   02/15/18 0752 02/15/18 1621 02/16/18 0005 02/16/18 0748  BP: (!) 179/155 121/79 124/76 122/87  Pulse: 98 96 98 94  Resp: 18 15 18 17   Temp: 97.7 F (36.5 C) 97.6 F (36.4 C) 98 F (36.7 C) 98.3 F (36.8 C)  TempSrc: Oral Oral Oral Oral  SpO2: 99% 98% 99% 98%  Weight:      Height:        Intake/Output Summary (Last 24 hours) at 02/16/2018 1326 Last data filed at 02/16/2018 0900 Gross per 24 hour  Intake 843 ml  Output 600 ml  Net 243 ml   Filed Weights   02/13/18 2016 02/14/18 0334  Weight: 75.8 kg (167 lb) 77.8 kg (171 lb 8.3 oz)    Exam:  . General: 67 y.o. year-old male well developed well nourished in no acute distress.  Alert and oriented x3. . Cardiovascular: Central chest scarring from CABG.  Regular rate and rhythm with no rubs or gallops.  No thyromegaly or JVD noted.   Marland Kitchen Respiratory: Clear to auscultation with no wheezes or rales. Good inspiratory effort. . Abdomen: Soft nontender nondistended with normal bowel sounds x4 quadrants. . Musculoskeletal: No lower extremity edema.  Pulses in all 4 extremities. . Skin: No ulcerative lesions noted or rashes, . Psychiatry: Mood is appropriate for condition and setting   Data Reviewed: CBC: Recent Labs  Lab 02/13/18 2117 02/14/18 0530  WBC 7.0 6.7  NEUTROABS  --  4.4  HGB 11.4* 10.9*  HCT 35.4* 34.0*  MCV 81.0  81.5  PLT 331 604   Basic Metabolic Panel: Recent Labs  Lab 02/13/18 2117 02/14/18 0530  NA 133* 135  K 4.4 4.6  CL 102 101  CO2 22 25  GLUCOSE 220* 192*  BUN 24* 20  CREATININE 1.85* 1.80*  CALCIUM 8.7* 8.8*   GFR: Estimated Creatinine Clearance: 38.5 mL/min (A) (by C-G formula based on SCr of 1.8 mg/dL (H)). Liver Function Tests: Recent Labs  Lab 02/13/18 2117  AST 22  ALT 15*  ALKPHOS 90  BILITOT 0.6  PROT 7.5  ALBUMIN 3.0*   No results for input(s): LIPASE, AMYLASE in the last 168 hours. No results for input(s): AMMONIA in the last 168 hours. Coagulation Profile: No results for input(s): INR, PROTIME in the last 168 hours. Cardiac Enzymes: Recent Labs  Lab 02/14/18 0530 02/14/18 1744 02/15/18 0728 02/16/18 0805  TROPONINI 0.21* 0.20* 0.20* 0.19*   BNP (last 3 results) No results for input(s): PROBNP in the last 8760 hours. HbA1C: No results for input(s): HGBA1C in the last 72 hours. CBG: Recent Labs  Lab 02/15/18 0751 02/15/18 1203 02/15/18 1622 02/15/18 2057 02/16/18 1209  GLUCAP 271* 183* 222* 293* 263*   Lipid Profile: No results for input(s): CHOL, HDL, LDLCALC, TRIG, CHOLHDL, LDLDIRECT in the last 72 hours. Thyroid Function Tests: No results for input(s): TSH, T4TOTAL, FREET4, T3FREE, THYROIDAB in the last 72 hours. Anemia Panel: No results for input(s): VITAMINB12, FOLATE, FERRITIN, TIBC, IRON, RETICCTPCT in the last 72 hours. Urine analysis:    Component Value Date/Time   COLORURINE STRAW (A) 03/12/2016 [place] 03/12/2016 0453   LABSPEC 1.027 03/12/2016 0453   PHURINE 5.0 03/12/2016 0453   GLUCOSEU >1000 (A) 03/12/2016 0453   HGBUR NEGATIVE 03/12/2016 0453   BILIRUBINUR NEGATIVE 03/12/2016 0453   KETONESUR 15 (A) 03/12/2016 0453   PROTEINUR NEGATIVE 03/12/2016 0453   UROBILINOGEN 0.2 08/13/2015 1030   NITRITE NEGATIVE 03/12/2016 0453   LEUKOCYTESUR NEGATIVE 03/12/2016 0453   Sepsis  Labs: @LABRCNTIP (procalcitonin:4,lacticidven:4)  ) Recent Results (from the past 240 hour(s))  Culture, blood (routine x 2)     Status: None (Preliminary result)   Collection Time: 02/14/18 12:15 AM  Result Value Ref Range Status   Specimen Description BLOOD LEFT ANTECUBITAL  Final   Special Requests   Final    BOTTLES DRAWN AEROBIC AND ANAEROBIC Blood Culture adequate volume   Culture   Final    NO GROWTH 1 DAY Performed at [facility], [place] [address] [place], [place] [address]    Report Status PENDING  Incomplete  Culture, blood (routine x 2)     Status: None (Preliminary result)   Collection Time: 02/14/18 12:18 AM  Result Value Ref Range Status   Specimen Description BLOOD RIGHT ANTECUBITAL  Final   Special Requests   Final    BOTTLES DRAWN AEROBIC AND ANAEROBIC Blood Culture adequate volume   Culture   Final    NO GROWTH 1 DAY Performed at [place], [place] [address], [place], [place] [address]    Report Status PENDING  Incomplete      Studies: Dg Shoulder Right  Result Date: 02/15/2018  CLINICAL DATA:  Acute RIGHT shoulder pain for 4 days. No known injury. Initial encounter. EXAM: RIGHT SHOULDER - 2+ VIEW COMPARISON:  None. FINDINGS: There is no evidence of fracture or dislocation. There is no evidence of arthropathy or other focal bone abnormality. Soft tissues are unremarkable. IMPRESSION: Negative. Electronically Signed   By: Margarette Canada M.D.   On: 02/15/2018 17:53    Scheduled Meds: . allopurinol  300 mg Oral Daily  . aspirin  81 mg Oral Daily  . diltiazem  120 mg Oral Daily  . emtricitabine-rilpivir-tenofovir AF  1 tablet Oral Q breakfast  . famotidine  20 mg Oral Daily  . feeding supplement (GLUCERNA SHAKE)  237 mL Oral Daily  . gabapentin  400 mg Oral BID  . heparin  5,000 Units Subcutaneous Q8H  . insulin aspart  0-15 Units Subcutaneous TID WC  . insulin aspart  0-5 Units Subcutaneous QHS  . insulin glargine  20 Units Subcutaneous QHS  .  isosorbide mononitrate  30 mg Oral Daily  . polyethylene glycol  17 g Oral Daily  . pravastatin  40 mg Oral q1800  . protein supplement shake  11 oz Oral Q1500  . senna-docusate  1 tablet Oral BID  . sertraline  50 mg Oral Daily  . sodium chloride flush  3 mL Intravenous Q12H  . sodium chloride flush  3 mL Intravenous Q12H    Continuous Infusions: . sodium chloride    . ceFEPime (MAXIPIME) IV Stopped (02/16/18 0630)  . vancomycin Stopped (02/16/18 0230)     LOS: 2 days     Kayleen Memos, MD [facility] Pager [phone]  If 7PM-7AM, please contact night-coverage [email or web address] Password [email or web address] 02/16/2018, 1:26 PM

## 2018-02-16 NOTE — Progress Notes (Signed)
Inpatient Diabetes Program Recommendations  AACE/ADA: New Consensus Statement on Inpatient Glycemic Control (2015)  Target Ranges:  Prepandial:   less than 140 mg/dL      Peak postprandial:   less than 180 mg/dL (1-2 hours)      Critically ill patients:  140 - 180 mg/dL   Lab Results  Component Value Date   GLUCAP 263 (H) 02/16/2018   HGBA1C 10.6 12/26/2016    Review of Glycemic Control Results for Michael [name], Michael [name] (MRN [id number]) as of 02/16/2018 13:22  Ref. Range 02/15/2018 07:51 02/15/2018 12:03 02/15/2018 16:22 02/15/2018 20:57 02/16/2018 12:09  Glucose-Capillary Latest Ref Range: 65 - 99 mg/dL 271 (H) 183 (H) 222 (H) 293 (H) 263 (H)   Diabetes history: DM2 Outpatient Diabetes medications: Lantus 30 units + Humalog 4 units tid + Metformin 1 gm bid Current orders for Inpatient glycemic control: Lantus 20 units + Novolog moderate correction tid + 0-5 units hs  Inpatient Diabetes Program Recommendations:   -Increase Lantus 30 units qd -Add Novolog 3 units tid meal coverage if eats 50%  Thank you, Bethena Roys E. Kingsly Kloepfer, RN, MSN, CDE  Diabetes Coordinator Inpatient Glycemic Control Team Team Pager [phone] (8am-5pm) 02/16/2018 1:31 PM

## 2018-02-17 DIAGNOSIS — R402413 Glasgow coma scale score 13-15, at hospital admission: Secondary | ICD-10-CM | POA: Diagnosis not present

## 2018-02-17 DIAGNOSIS — Z9079 Acquired absence of other genital organ(s): Secondary | ICD-10-CM | POA: Diagnosis not present

## 2018-02-17 DIAGNOSIS — I129 Hypertensive chronic kidney disease with stage 1 through stage 4 chronic kidney disease, or unspecified chronic kidney disease: Secondary | ICD-10-CM | POA: Diagnosis not present

## 2018-02-17 DIAGNOSIS — M359 Systemic involvement of connective tissue, unspecified: Secondary | ICD-10-CM | POA: Diagnosis not present

## 2018-02-17 DIAGNOSIS — I970 Postcardiotomy syndrome: Secondary | ICD-10-CM | POA: Diagnosis not present

## 2018-02-17 DIAGNOSIS — R071 Chest pain on breathing: Secondary | ICD-10-CM | POA: Diagnosis not present

## 2018-02-17 DIAGNOSIS — R0602 Shortness of breath: Secondary | ICD-10-CM | POA: Diagnosis not present

## 2018-02-17 DIAGNOSIS — M94262 Chondromalacia, left knee: Secondary | ICD-10-CM | POA: Diagnosis not present

## 2018-02-17 DIAGNOSIS — R0789 Other chest pain: Secondary | ICD-10-CM | POA: Diagnosis not present

## 2018-02-17 DIAGNOSIS — J45909 Unspecified asthma, uncomplicated: Secondary | ICD-10-CM | POA: Diagnosis not present

## 2018-02-17 DIAGNOSIS — E138 Other specified diabetes mellitus with unspecified complications: Secondary | ICD-10-CM | POA: Diagnosis not present

## 2018-02-17 DIAGNOSIS — E1151 Type 2 diabetes mellitus with diabetic peripheral angiopathy without gangrene: Secondary | ICD-10-CM | POA: Diagnosis not present

## 2018-02-17 DIAGNOSIS — E1140 Type 2 diabetes mellitus with diabetic neuropathy, unspecified: Secondary | ICD-10-CM | POA: Diagnosis not present

## 2018-02-17 DIAGNOSIS — N183 Chronic kidney disease, stage 3 (moderate): Secondary | ICD-10-CM | POA: Diagnosis not present

## 2018-02-17 DIAGNOSIS — B20 Human immunodeficiency virus [HIV] disease: Secondary | ICD-10-CM | POA: Diagnosis not present

## 2018-02-17 DIAGNOSIS — N179 Acute kidney failure, unspecified: Secondary | ICD-10-CM | POA: Diagnosis not present

## 2018-02-17 DIAGNOSIS — R7989 Other specified abnormal findings of blood chemistry: Secondary | ICD-10-CM | POA: Diagnosis not present

## 2018-02-17 DIAGNOSIS — E785 Hyperlipidemia, unspecified: Secondary | ICD-10-CM | POA: Diagnosis not present

## 2018-02-17 DIAGNOSIS — I10 Essential (primary) hypertension: Secondary | ICD-10-CM | POA: Diagnosis not present

## 2018-02-17 DIAGNOSIS — R279 Unspecified lack of coordination: Secondary | ICD-10-CM | POA: Diagnosis not present

## 2018-02-17 DIAGNOSIS — M25511 Pain in right shoulder: Secondary | ICD-10-CM | POA: Diagnosis not present

## 2018-02-17 DIAGNOSIS — I2510 Atherosclerotic heart disease of native coronary artery without angina pectoris: Secondary | ICD-10-CM | POA: Diagnosis not present

## 2018-02-17 DIAGNOSIS — Z8701 Personal history of pneumonia (recurrent): Secondary | ICD-10-CM | POA: Diagnosis not present

## 2018-02-17 DIAGNOSIS — K5900 Constipation, unspecified: Secondary | ICD-10-CM | POA: Diagnosis not present

## 2018-02-17 DIAGNOSIS — M6281 Muscle weakness (generalized): Secondary | ICD-10-CM | POA: Diagnosis not present

## 2018-02-17 DIAGNOSIS — Z833 Family history of diabetes mellitus: Secondary | ICD-10-CM | POA: Diagnosis not present

## 2018-02-17 DIAGNOSIS — Z951 Presence of aortocoronary bypass graft: Secondary | ICD-10-CM | POA: Diagnosis not present

## 2018-02-17 DIAGNOSIS — Z743 Need for continuous supervision: Secondary | ICD-10-CM | POA: Diagnosis not present

## 2018-02-17 DIAGNOSIS — J181 Lobar pneumonia, unspecified organism: Secondary | ICD-10-CM | POA: Diagnosis not present

## 2018-02-17 DIAGNOSIS — R079 Chest pain, unspecified: Secondary | ICD-10-CM | POA: Diagnosis not present

## 2018-02-17 DIAGNOSIS — I213 ST elevation (STEMI) myocardial infarction of unspecified site: Secondary | ICD-10-CM | POA: Diagnosis not present

## 2018-02-17 DIAGNOSIS — Y95 Nosocomial condition: Secondary | ICD-10-CM | POA: Diagnosis present

## 2018-02-17 DIAGNOSIS — J449 Chronic obstructive pulmonary disease, unspecified: Secondary | ICD-10-CM | POA: Diagnosis not present

## 2018-02-17 DIAGNOSIS — E1165 Type 2 diabetes mellitus with hyperglycemia: Secondary | ICD-10-CM | POA: Diagnosis not present

## 2018-02-17 DIAGNOSIS — F329 Major depressive disorder, single episode, unspecified: Secondary | ICD-10-CM | POA: Diagnosis present

## 2018-02-17 DIAGNOSIS — N433 Hydrocele, unspecified: Secondary | ICD-10-CM | POA: Diagnosis not present

## 2018-02-17 DIAGNOSIS — Z21 Asymptomatic human immunodeficiency virus [HIV] infection status: Secondary | ICD-10-CM | POA: Diagnosis present

## 2018-02-17 DIAGNOSIS — M109 Gout, unspecified: Secondary | ICD-10-CM | POA: Diagnosis not present

## 2018-02-17 DIAGNOSIS — F1410 Cocaine abuse, uncomplicated: Secondary | ICD-10-CM | POA: Diagnosis not present

## 2018-02-17 DIAGNOSIS — N453 Epididymo-orchitis: Secondary | ICD-10-CM | POA: Diagnosis not present

## 2018-02-17 DIAGNOSIS — I319 Disease of pericardium, unspecified: Secondary | ICD-10-CM | POA: Diagnosis not present

## 2018-02-17 DIAGNOSIS — E1122 Type 2 diabetes mellitus with diabetic chronic kidney disease: Secondary | ICD-10-CM | POA: Diagnosis not present

## 2018-02-17 DIAGNOSIS — F1420 Cocaine dependence, uncomplicated: Secondary | ICD-10-CM | POA: Diagnosis present

## 2018-02-17 DIAGNOSIS — K219 Gastro-esophageal reflux disease without esophagitis: Secondary | ICD-10-CM | POA: Diagnosis not present

## 2018-02-17 DIAGNOSIS — I4891 Unspecified atrial fibrillation: Secondary | ICD-10-CM | POA: Diagnosis not present

## 2018-02-17 DIAGNOSIS — I5030 Unspecified diastolic (congestive) heart failure: Secondary | ICD-10-CM | POA: Diagnosis not present

## 2018-02-17 DIAGNOSIS — B1920 Unspecified viral hepatitis C without hepatic coma: Secondary | ICD-10-CM | POA: Diagnosis present

## 2018-02-17 DIAGNOSIS — J189 Pneumonia, unspecified organism: Secondary | ICD-10-CM | POA: Diagnosis not present

## 2018-02-17 LAB — GLUCOSE, CAPILLARY
GLUCOSE-CAPILLARY: 190 mg/dL — AB (ref 65–99)
GLUCOSE-CAPILLARY: 299 mg/dL — AB (ref 65–99)
Glucose-Capillary: 178 mg/dL — ABNORMAL HIGH (ref 65–99)

## 2018-02-17 LAB — CREATININE, SERUM
CREATININE: 1.6 mg/dL — AB (ref 0.61–1.24)
GFR calc non Af Amer: 43 mL/min — ABNORMAL LOW (ref >60)
GFR, EST AFRICAN AMERICAN: 50 mL/min — AB (ref >60)

## 2018-02-17 MED ORDER — INSULIN ASPART 100 UNIT/ML ~~LOC~~ SOLN
3.0000 [IU] | Freq: Three times a day (TID) | SUBCUTANEOUS | Status: DC
  Administered 2018-02-17: 3 [IU] via SUBCUTANEOUS

## 2018-02-17 MED ORDER — AMOXICILLIN-POT CLAVULANATE 875-125 MG PO TABS: 1.0000 | ORAL_TABLET | Freq: Two times a day (BID) | ORAL | Status: DC

## 2018-02-17 MED ORDER — AMOXICILLIN-POT CLAVULANATE 875-125 MG PO TABS: 1.0000 | ORAL_TABLET | Freq: Two times a day (BID) | ORAL | 0 refills | Status: DC

## 2018-02-17 MED ORDER — INSULIN GLARGINE 100 UNIT/ML ~~LOC~~ SOLN
30.0000 [IU] | Freq: Every day | SUBCUTANEOUS | Status: DC
  Filled 2018-02-17: qty 0.3

## 2018-02-17 NOTE — Discharge Summary (Signed)
Physician Discharge Summary  Michael [name] WUJ:[id number] DOB: 01/02/1951 DOA: 02/13/2018  PCP: [name], PA-C  Admit date: 02/13/2018 Discharge date: 02/17/2018  Admitted From: Home Disposition:  Home  Discharge Condition:Stable CODE STATUS:FULL Diet recommendation: Heart Healthy  Brief/Interim Summary: Michael Bruschi Andersonis a 67 y.o.malewith medical history significant forcocaine dependence in remission, HIV (CD4 72 in April '19 and VL undetectable in Jan '19), depression with anxiety, atrial fibrillation, hypertension, andcoronary artery disease status post CABGwith MAZE procedureon 01/29/2018,who presents from SNF with pain in the right shoulder/ right back and cough with chills without fever. At the emergency room patient was noted to be tachycardic without acute EKG changes despite mild elevated troponin levels, and CT angiogram was negative for PE but positive for right lower lobe consolidation and admitted for healthcare acquired pneumonia. Patient was started on broad-spectrum antibiotics.  His respiratory status gradually improved.  Currently he does not report any dyspnea.  He is saturating fine on room air. He is stable to be discharged back to skilled nursing facility today.  Antibiotics have been changed to oral today.  Following problems were addressed during his hospitalization:  HCAP: Recent CABG done at [facility] on empiric IV antibiotics IV vancomycin and IV cefepime CTA chest negative for PE  Respiratory status much improved today.  Antibiotics changed to oral.  Coronary artery disease : Status post CABG on 01/29/18 CABG done at [facility] Follow-up outpatient Continue aspirin, statin, nitrates  AKI on CKD 3 Creatinine on admission 1.85 Baseline creatinine 1.5.Creatinine today 1.6 Avoid nephrotoxic agents Repeat BMP in a week.  Elevated troponin Peaked at 0.21 and trended down to 0.19 Last EKG done on 02/13/2018 revealed sinus  rhythm with rate of 87 with no ST-T elevations Elevated troponin most likely associated with recent CABG.  Patient does not complain of any chest pain.  Physical debility/ambulatory dysfunction PT evaluated and recommended SNF Fall precautions History of stroke with mild residual weakness on the right side.  Moderate protein calorie malnutrition Albumin 3.0 Encourage increasing oral intake  Right shoulder pain: X-ray of the right shoulder did not show any fracture dislocation or any other acute inflammatory process.  Likely chronic pain.  Follow-up as an outpatient with PMD/orthopedics.     Discharge Diagnoses:  Principal Problem:   HCAP (healthcare-associated pneumonia) Active Problems:   HIV (human immunodeficiency virus infection) ([place])   Uncontrolled type 2 diabetes with neuropathy ([place])   Hypertension goal BP (blood pressure) < 140/80   3-vessel CAD   Severe recurrent major depressive disorder with psychotic features ([place])   CKD (chronic kidney disease) stage 3, GFR 30-59 ml/min ([name])   AKI (acute kidney injury) [facility])    Discharge Instructions  Discharge Instructions    Diet - low sodium heart healthy   Complete by:  As directed    Discharge instructions   Complete by:  As directed    1) Take prescribed medications as instructed. 2) Follow up with your PMD in a week. 3) Do a BMP test in a week to check your kidney function.   Increase activity slowly   Complete by:  As directed      Allergies as of 02/17/2018   No Known Allergies     Medication List    TAKE these medications   ACCU-CHEK SOFTCLIX LANCETS lancets USE TO CHECK BLOOD GLUCOSE LEVELS UP TO 4 TIMES DAILY AS DIRECTED   albuterol 108 (90 Base) MCG/ACT inhaler Commonly known as:  PROAIR HFA INHALE TWO PUFFS INTO THE  LUNGS EVERY 6 (SIX) HOURS AS NEEDED FOR WHEEZING OR SHORTNESS OF BREATH.   allopurinol 300 MG tablet Commonly known as:  ZYLOPRIM Take 1 tablet (300 mg total) by mouth daily. For  gout.   amoxicillin-clavulanate 875-125 MG tablet Commonly known as:  AUGMENTIN Take 1 tablet by mouth every 12 (twelve) hours for 4 days. Start taking on:  02/18/2018   aspirin 81 MG chewable tablet Chew 81 mg by mouth daily.   diltiazem 120 MG 24 hr capsule Commonly known as:  CARDIZEM CD Take 1 capsule (120 mg total) by mouth daily. For high blood pressure   emtricitabine-rilpivir-tenofovir AF 200-25-25 MG Tabs tablet Commonly known as:  ODEFSEY Take 1 tablet by mouth daily with breakfast. For HIV infection   famotidine 20 MG tablet Commonly known as:  PEPCID Take 1 tablet (20 mg total) by mouth daily. For acid reflux   gabapentin 400 MG capsule Commonly known as:  NEURONTIN Take 1 capsule (400 mg total) by mouth 2 (two) times daily. For agitation/neuropathic pain What changed:    when to take this  reasons to take this  additional instructions   hydrochlorothiazide 12.5 MG capsule Commonly known as:  MICROZIDE Take 1 capsule (12.5 mg total) by mouth daily. For high blood pressure   hydrOXYzine 25 MG tablet Commonly known as:  ATARAX/VISTARIL Take 1 tablet (25 mg total) by mouth 3 (three) times daily as needed for anxiety.   insulin glargine 100 UNIT/ML injection Commonly known as:  LANTUS Inject 0.3 mLs (30 Units total) into the skin at bedtime. For diabetes management   insulin lispro 100 UNIT/ML injection Commonly known as:  HUMALOG Inject 0.05 mLs (5 Units total) into the skin 3 (three) times daily with meals. For diabetes management What changed:    how much to take  additional instructions   Insulin Syringe-Needle U-100 27G X 5/8" 1 ML Misc Commonly known as:  B-D INS SYR MICROFINE 1CC/27G 1 strip by Does not apply route 3 (three) times daily as needed. For blood sugar checks   isosorbide mononitrate 30 MG 24 hr tablet Commonly known as:  IMDUR Take 1 tablet (30 mg total) by mouth daily. For chest   losartan 50 MG tablet Commonly known as:   COZAAR Take 1 tablet (50 mg total) by mouth daily. For high blood pressure   metFORMIN 1000 MG tablet Commonly known as:  GLUCOPHAGE Take 1 tablet (1,000 mg total) by mouth 2 (two) times daily with a meal. For diabetes management   pravastatin 40 MG tablet Commonly known as:  PRAVACHOL Take 1 tablet (40 mg total) by mouth at bedtime. For high cholesterol   sertraline 50 MG tablet Commonly known as:  ZOLOFT Take 1 tablet (50 mg total) by mouth daily. For depression   traZODone 50 MG tablet Commonly known as:  DESYREL Take 1 tablet (50 mg total) by mouth at bedtime as needed for sleep.       Contact information for follow-up providers    [name], PA-C. Schedule an appointment as soon as possible for a visit in 1 week(s).   Specialty:  Physician Assistant Contact information: [place] [address] [phone]        [name], MD .   Specialty:  Infectious Diseases Contact information: [address] [place] [address] [phone]            Contact information for after-discharge care    Destination  [facility] .   Service:  Skilled Nursing Contact information: [address] [place] [phone]                 No Known Allergies  Consultations: None  Procedures/Studies: Dg Shoulder Right  Result Date: 02/15/2018 CLINICAL DATA:  Acute RIGHT shoulder pain for 4 days. No known injury. Initial encounter. EXAM: RIGHT SHOULDER - 2+ VIEW COMPARISON:  None. FINDINGS: There is no evidence of fracture or dislocation. There is no evidence of arthropathy or other focal bone abnormality. Soft tissues are unremarkable. IMPRESSION: Negative. Electronically Signed   By: Margarette Canada M.D.   On: 02/15/2018 17:53   Ct Angio Chest Pe W And/or Wo Contrast  Result Date: 02/13/2018 CLINICAL DATA:  67 year old male with shortness of breath. EXAM: CT ANGIOGRAPHY CHEST WITH  CONTRAST TECHNIQUE: Multidetector CT imaging of the chest was performed using the standard protocol during bolus administration of intravenous contrast. Multiplanar CT image reconstructions and MIPs were obtained to evaluate the vascular anatomy. CONTRAST:  1105mL ISOVUE-370 IOPAMIDOL (ISOVUE-370) INJECTION 76% COMPARISON:  Chest radiograph dated 04/01/2017 and CT dated 04/02/2017 FINDINGS: Cardiovascular: There is no cardiomegaly or pericardial effusion. Multi vessel coronary vascular calcification and postsurgical changes of CABG. Mild anterior mediastinal/retrosternal stranding related to recent surgery. No fluid collection. There is mild atherosclerotic calcification of the thoracic aorta. No aneurysmal dilatation or dissection. Evaluation of the pulmonary arteries is limited due to respiratory motion artifact. No pulmonary artery embolus identified. Mediastinum/Nodes: There is no hilar or mediastinal adenopathy. Esophagus and the thyroid gland are grossly unremarkable. No mediastinal fluid collection. Lungs/Pleura: There is an area of consolidative change with air bronchogram in the right lower lobe. Focal area of hazy and ground-glass density in the medial basal segment of the left lower lobe noted which may represent atelectasis/scarring versus infiltrate although an area of infarct is not excluded. There is no pleural effusion or pneumothorax. The central airways are patent. Upper Abdomen: No acute abnormality. Musculoskeletal: Median sternotomy wires. Degenerative changes of the spine. No acute osseous pathology Review of the MIP images confirms the above findings. IMPRESSION: 1. No CT evidence of pulmonary embolism. 2. Consolidative changes of the right lower lobe most consistent with pneumonia. Clinical correlation and follow-up to resolution recommended. Smaller focal ground-glass density at the left lung base may also represent infection. 3. Multi vessel coronary vascular calcification and postsurgical  changes of CABG. No mediastinal fluid collection. Electronically Signed   By: Anner Crete M.D.   On: 02/13/2018 23:28       Subjective: Patient seen and examined at bedside this morning.  Remains comfortable.  Stable for discharge to SNF today.  Discharge Exam: Vitals:   02/16/18 2253 02/17/18 0818  BP: 128/85 (!) 127/103  Pulse: 98 98  Resp: 19 20  Temp: 98.3 F (36.8 C) (!) 97.5 F (36.4 C)  SpO2: 100%    Vitals:   02/16/18 0748 02/16/18 1614 02/16/18 2253 02/17/18 0818  BP: 122/87 106/73 128/85 (!) 127/103  Pulse: 94 94 98 98  Resp: 17 18 19 20   Temp: 98.3 F (36.8 C) 97.7 F (36.5 C) 98.3 F (36.8 C) (!) 97.5 F (36.4 C)  TempSrc: Oral Oral Oral Oral  SpO2: 98% 98% 100%   Weight:      Height:        General: Pt is alert, awake, not in acute distress Cardiovascular: RRR, S1/S2 +, no rubs, no gallops Respiratory: CTA bilaterally, no wheezing, no rhonchi Abdominal:  Soft, NT, ND, bowel sounds + Extremities: no edema, no cyanosis    The results of significant diagnostics from this hospitalization (including imaging, microbiology, ancillary and laboratory) are listed below for reference.     Microbiology: Recent Results (from the past 240 hour(s))  Culture, blood (routine x 2)     Status: None (Preliminary result)   Collection Time: 02/14/18 12:15 AM  Result Value Ref Range Status   Specimen Description BLOOD LEFT ANTECUBITAL  Final   Special Requests   Final    BOTTLES DRAWN AEROBIC AND ANAEROBIC Blood Culture adequate volume   Culture   Final    NO GROWTH 3 DAYS Performed at [facility], [address], [place], [place] [address]    Report Status PENDING  Incomplete  Culture, blood (routine x 2)     Status: None (Preliminary result)   Collection Time: 02/14/18 12:18 AM  Result Value Ref Range Status   Specimen Description BLOOD RIGHT ANTECUBITAL  Final   Special Requests   Final    BOTTLES DRAWN AEROBIC AND ANAEROBIC Blood Culture adequate  volume   Culture   Final    NO GROWTH 3 DAYS Performed at [facility], [place] [address] [place], [place] [address]    Report Status PENDING  Incomplete     Labs: BNP (last 3 results) No results for input(s): BNP in the last 8760 hours. Basic Metabolic Panel: Recent Labs  Lab 02/13/18 2117 02/14/18 0530 02/17/18 0301  NA 133* 135  --   K 4.4 4.6  --   CL 102 101  --   CO2 22 25  --   GLUCOSE 220* 192*  --   BUN 24* 20  --   CREATININE 1.85* 1.80* 1.60*  CALCIUM 8.7* 8.8*  --    Liver Function Tests: Recent Labs  Lab 02/13/18 2117  AST 22  ALT 15*  ALKPHOS 90  BILITOT 0.6  PROT 7.5  ALBUMIN 3.0*   No results for input(s): LIPASE, AMYLASE in the last 168 hours. No results for input(s): AMMONIA in the last 168 hours. CBC: Recent Labs  Lab 02/13/18 2117 02/14/18 0530  WBC 7.0 6.7  NEUTROABS  --  4.4  HGB 11.4* 10.9*  HCT 35.4* 34.0*  MCV 81.0 81.5  PLT 331 323   Cardiac Enzymes: Recent Labs  Lab 02/14/18 0530 02/14/18 1744 02/15/18 0728 02/16/18 0805  TROPONINI 0.21* 0.20* 0.20* 0.19*   BNP: Invalid input(s): POCBNP CBG: Recent Labs  Lab 02/16/18 1209 02/16/18 1643 02/16/18 2116 02/17/18 0722 02/17/18 1152  GLUCAP 263* 330* 225* 190* 299*   D-Dimer No results for input(s): DDIMER in the last 72 hours. Hgb A1c No results for input(s): HGBA1C in the last 72 hours. Lipid Profile No results for input(s): CHOL, HDL, LDLCALC, TRIG, CHOLHDL, LDLDIRECT in the last 72 hours. Thyroid function studies No results for input(s): TSH, T4TOTAL, T3FREE, THYROIDAB in the last 72 hours.  Invalid input(s): FREET3 Anemia work up No results for input(s): VITAMINB12, FOLATE, FERRITIN, TIBC, IRON, RETICCTPCT in the last 72 hours. Urinalysis    Component Value Date/Time   COLORURINE STRAW (A) 03/12/2016 0453   APPEARANCEUR CLEAR 03/12/2016 0453   LABSPEC 1.027 03/12/2016 0453   PHURINE 5.0 03/12/2016 0453   GLUCOSEU >1000 (A) 03/12/2016 0453   HGBUR  NEGATIVE 03/12/2016 0453   BILIRUBINUR NEGATIVE 03/12/2016 0453   KETONESUR 15 (A) 03/12/2016 0453   PROTEINUR NEGATIVE 03/12/2016 0453   UROBILINOGEN 0.2 08/13/2015 1030  NITRITE NEGATIVE 03/12/2016 0453   LEUKOCYTESUR NEGATIVE 03/12/2016 0453   Sepsis Labs Invalid input(s): PROCALCITONIN,  WBC,  LACTICIDVEN Microbiology Recent Results (from the past 240 hour(s))  Culture, blood (routine x 2)     Status: None (Preliminary result)   Collection Time: 02/14/18 12:15 AM  Result Value Ref Range Status   Specimen Description BLOOD LEFT ANTECUBITAL  Final   Special Requests   Final    BOTTLES DRAWN AEROBIC AND ANAEROBIC Blood Culture adequate volume   Culture   Final    NO GROWTH 3 DAYS Performed at [facility], [address], [place], [place] [address]    Report Status PENDING  Incomplete  Culture, blood (routine x 2)     Status: None (Preliminary result)   Collection Time: 02/14/18 12:18 AM  Result Value Ref Range Status   Specimen Description BLOOD RIGHT ANTECUBITAL  Final   Special Requests   Final    BOTTLES DRAWN AEROBIC AND ANAEROBIC Blood Culture adequate volume   Culture   Final    NO GROWTH 3 DAYS Performed at [facility], [place] [address], [place], [place] [address]    Report Status PENDING  Incomplete     Time coordinating discharge: 35 minutes  SIGNED:   Shelly Coss, MD  [facility] 02/17/2018, 12:43 PM Pager [phone]  If 7PM-7AM, please contact night-coverage [email or web address] Password [email or web address]

## 2018-02-17 NOTE — Progress Notes (Signed)
Report called to Hulan Fray, LPN who will receive pt upon arrival to [facility].

## 2018-02-17 NOTE — Progress Notes (Signed)
Occupational Therapy Evaluation Patient Details Name: Michael [name] MRN: [id number] DOB: [birth year] Today's Date: 02/17/2018    History of Present Illness 67 y.o. male with medical history significant for cocaine dependence in remission, HIV (CD4 60 in April '19 and VL undetectable in Jan '19), depression with anxiety, atrial fibrillation, hypertension, and coronary artery disease status post CABG with MAZE procedure on 01/29/2018, now presenting from his SNF for evaluation of pain in the right shoulder and right back.  Patient was discharged from the hospital on 02/06/2018 after undergoing a three-vessel CABG on 01/29/2018, had been convalescing at [facility]. CTA chest is negative for PE, but notable for a right lower lobe consolidation. Presents with HCAP, AKI on CKD III Pt reports CVA since CABG (not found in H&P) and associated L-sided weakness, vision loss and difficulty speaking.    Clinical Impression   PTA, pt participating in reahb at [facility]. Prior to SNF, pt was independent and living alone. Pt presents with apparent cognitive and balance deficits in addition to generalized weakness, requiring min A for mobility and mod A for LB ADL. Pt demonstrates a + Spurling's Test, indicating possible nerve root compression with reproducing symptoms of pain shooting down R arm. Pt does have a cervical history. Pt also demonstrates symptoms more consistent with possible R shoulder impingement. Pt will benefit form further rehab at SNF to facilitae safe DC home. Pt will most likely need to continue with outpt therapy for R shoulder after DC from SNF. Will follow acutely to faciltiaet safe DC to next venue of care and address RUE shoulder pain.     Follow Up Recommendations  SNF;Supervision/Assistance - 24 hour    Equipment Recommendations  3 in 1 bedside commode    Recommendations for Other Services       Precautions / Restrictions Precautions Precautions: Sternal Precaution Comments: s/p 4/25 CABG  requires assist to recall precautions Restrictions Weight Bearing Restrictions: No      Mobility Bed Mobility Overal bed mobility: Needs Assistance Bed Mobility: Supine to Sit     Supine to sit: Supervision     General bed mobility comments: supervision for safety, adhering to sternal precautions  Transfers Overall transfer level: Needs assistance Equipment used: None Transfers: Sit to/from Stand Sit to Stand: Min assist         General transfer comment: min A steadying while maintaining sternal precautions, increased posterior push of calves on bed to bring himself into standing    Balance Overall balance assessment: Needs assistance Sitting-balance support: No upper extremity supported;Feet supported Sitting balance-Leahy Scale: Good     Standing balance support: Single extremity supported;During functional activity Standing balance-Leahy Scale: Fair Standing balance comment: assist of LUE on sink while performing oral care                           ADL either performed or assessed with clinical judgement   ADL Overall ADL's : Needs assistance/impaired     Grooming: Wash/dry face;Oral care;Supervision/safety;Standing;Min guard Grooming Details (indicate cue type and reason): Cueing for use of RUE; dropping toothbrush Upper Body Bathing: Set up;Supervision/ safety;Sitting   Lower Body Bathing: Moderate assistance;Sit to/from stand   Upper Body Dressing : Sitting;Minimal assistance   Lower Body Dressing: Moderate assistance;Sit to/from stand   Toilet Transfer: Minimal assistance;Ambulation   Toileting- Clothing Manipulation and Hygiene: Supervision/safety;Sit to/from stand       Functional mobility during ADLs: Minimal assistance(steady A) General ADL Comments: May benefit form  AE for LB ADL; May benefit from built up tubing     Vision   Additional Comments: will further assess     Perception     Praxis      Pertinent Vitals/Pain Pain  Assessment: 0-10 Pain Score: 8  Pain Location: R shoulder; upper trap, LBP  Pain Descriptors / Indicators: Stabbing Pain Intervention(s): Limited activity within patient's tolerance;Repositioned;Ice applied;Heat applied(heat upper trap; ice ant shoulder)     Hand Dominance Right   Extremity/Trunk Assessment Upper Extremity Assessment Upper Extremity Assessment: RUE deficits/detail RUE Deficits / Details: Limited voluntary use of RUE due to pain in shoulder and cervical spine area.  RUE Sensation: ("feels numb") RUE Coordination: decreased fine motor;decreased gross motor LUE Deficits / Details: R sided weakness s/p CVA per pt; cues to use RUE during functional tasks; note deficits wtih in-hand manipulation ski9lls; dropping items       Cervical / Trunk Assessment Cervical / Trunk Assessment: Other exceptions(Positive Spurling's Test ) Cervical / Trunk Exceptions: Reproduction of symptoms with spurling's; pain radiating down RUE(Limited cervical ROM)   Communication Communication Communication: Expressive difficulties(s/p CVA increased time for response)   Cognition Arousal/Alertness: Awake/alert Behavior During Therapy: Flat affect Overall Cognitive Status: Impaired/Different from baseline Area of Impairment: Memory;Problem solving;Orientation;Awareness                 Orientation Level: Time   Memory: Decreased short-term memory;Decreased recall of precautions  Pt given exercises to complete. Pt unable to recall exercises after 5 min delay Pt reoriented to day. Pt unable to recall day after 5 min delay   Awareness: Emergent Problem Solving: Slow processing;Requires verbal cues;Requires tactile cues     General Comments       Exercises Exercises: General Upper Extremity General Exercises - Upper Extremity Shoulder Flexion: AROM   Shoulder Instructions      Home Living Family/patient expects to be discharged to:: Skilled nursing facility                                         Prior Functioning/Environment Level of Independence: Independent;Needs assistance  Gait / Transfers Assistance Needed: min A for ambulation of 15 ft  ADL's / Homemaking Assistance Needed: requires assist for bathing and dressing   Comments: Prior to CABG 4/25 independent with all ADLs, community ambulator, at SNF ambulating 20 feet, assist with ADLs        OT Problem List: Decreased strength;Decreased range of motion;Decreased activity tolerance;Impaired balance (sitting and/or standing);Decreased coordination;Decreased cognition;Impaired UE functional use;Pain;Cardiopulmonary status limiting activity;Decreased safety awareness;Decreased knowledge of use of DME or AE;Impaired sensation      OT Treatment/Interventions: Self-care/ADL training;Therapeutic activities;Energy conservation;Therapeutic exercise;DME and/or AE instruction;Cognitive remediation/compensation;Patient/family education;Balance training    OT Goals(Current goals can be found in the care plan section) Acute Rehab OT Goals Patient Stated Goal: to get back to fishing OT Goal Formulation: With patient Time For Goal Achievement: 03/03/18 Potential to Achieve Goals: Good  OT Frequency: Min 2X/week   Barriers to D/C:            Co-evaluation              AM-PAC PT "6 Clicks" Daily Activity     Outcome Measure Help from another person eating meals?: None Help from another person taking care of personal grooming?: A Little Help from another person toileting, which includes using toliet, bedpan, or urinal?: A Little Help  from another person bathing (including washing, rinsing, drying)?: A Little Help from another person to put on and taking off regular upper body clothing?: A Little Help from another person to put on and taking off regular lower body clothing?: A Lot 6 Click Score: 18   End of Session Equipment Utilized During Treatment: Gait belt Nurse Communication: Patient  requests pain meds;Other (comment)(Urine output of 382ml)  Activity Tolerance: Patient limited by fatigue;Patient limited by pain Patient left: in chair;with call bell/phone within reach;with chair alarm set  OT Visit Diagnosis: Unsteadiness on feet (R26.81);Muscle weakness (generalized) (M62.81);Other symptoms and signs involving cognitive function;Pain Pain - Right/Left: Right Pain - part of body: Shoulder                Time: 9937-1696 OT Time Calculation (min): 40 min Charges:  OT General Charges $OT Visit: 1 Visit OT Evaluation $OT Eval Low Complexity: 1 Low $OT Eval Moderate Complexity: 1 Mod OT Treatments $Self Care/Home Management : 23-37 mins G-Codes:     Maurie Boettcher, OT/L  OT Clinical Specialist [phone]   [facility] 02/17/2018, 1:26 PM

## 2018-02-17 NOTE — Progress Notes (Signed)
Patient will discharge to [facility] Anticipated discharge date: 5/14 Transportation by [facility]- scheduled for 3pm Report #: [phone]  CSW signing off.  Jorge Ny, LCSW Clinical Social Worker [phone]

## 2018-02-17 NOTE — Progress Notes (Signed)
Pt left via stretcher for ambulance transfer by [facility] to [facility].  No s/s of distress.  Respirations even and unlabored.

## 2018-02-17 NOTE — Progress Notes (Signed)
Pharmacy Antibiotic Note  Michael [name] is a 67 y.o. male admitted on 02/13/2018 with pneumonia.  Renal fx stable  Plan: Vancomycin 1000 mg IV q24h Zosyn 3.375G IV q8h to be infused over 4 hours Trend WBC, temp, renal function   Height: (5 foot 7 inches) Weight: 171 lb 8.3 oz (77.8 kg) IBW/kg (Calculated) : 68.4  Temp (24hrs), Avg:97.8 F (36.6 C), Min:97.5 F (36.4 C), Max:98.3 F (36.8 C)  Recent Labs  Lab 02/13/18 2117 02/14/18 0530 02/17/18 0301  WBC 7.0 6.7  --   CREATININE 1.85* 1.80* 1.60*    Estimated Creatinine Clearance: 43.3 mL/min (A) (by C-G formula based on SCr of 1.6 mg/dL (H)).    No Known Allergies  Levester Fresh, PharmD, BCPS, BCCCP Clinical Pharmacist Clinical phone for 02/17/2018 from 7a-3:30p: [phone] If after 3:30p, please call main pharmacy at: [phone] 02/17/2018 11:06 AM

## 2018-02-17 NOTE — Progress Notes (Signed)
Gave AD materials to patient.  He wants to go over this packet with his family before going further.  If he chooses to fill it out, he knows to have chaplain contacted to come back and complete the forms. Conard Novak, Chaplain   02/17/18 1100  Clinical Encounter Type  Visited With Patient  Visit Type Initial;Other (Comment) (Gave AD materials to patient)  Referral From Physician  Consult/Referral To Chaplain  Spiritual Encounters  Spiritual Needs Literature  Stress Factors  Patient Stress Factors None identified  Family Stress Factors None identified

## 2018-02-17 NOTE — NC FL2 (Signed)
[place] LEVEL OF CARE SCREENING TOOL     IDENTIFICATION  Patient Name: Michael [name] Birthdate: [birth year] Sex: male Admission Date (Current Location): 02/13/2018  [facility] and [place] Number:  Herbalist and Address:  [facility], [place] [address] [place], [place] [address]      Provider Number: [id number]  Attending Physician Name and Address:  Shelly Coss, MD  Relative Name and Phone Number:       Current Level of Care: Hospital Recommended Level of Care: [place] Prior Approval Number:    Date Approved/Denied:   PASRR Number: [id number]  Discharge Plan: SNF    Current Diagnoses: Patient Active Problem List   Diagnosis Date Noted  . AKI (acute kidney injury) ([place]) 02/14/2018  . HCAP (healthcare-associated pneumonia) 02/14/2018  . Hepatitis C 12/30/2016  . Back pain 04/18/2016  . S/P carotid endarterectomy 11/15/2015  . MDD (major depressive disorder), recurrent severe, without psychosis ([place]) 09/09/2015  . Cocaine-induced mood disorder ([place]) 08/14/2015  . Cocaine abuse with cocaine-induced mood disorder ([place]) 08/14/2015  . Gout 07/10/2015  . CKD (chronic kidney disease) stage 3, GFR 30-59 ml/min ([name]) 03/06/2015  . Normocytic anemia 03/06/2015  . Hypoglycemia   . Encounter for general adult medical examination with abnormal findings 02/09/2015  . Cocaine use disorder, severe, dependence ([place]) 12/13/2014  . Substance or medication-induced depressive disorder with onset during withdrawal ([place]) 12/13/2014  . Severe recurrent major depressive disorder with psychotic features ([place]) 12/12/2014  . Cervicalgia 06/28/2014  . Lumbar radiculopathy, chronic 06/28/2014  . Asthma, chronic 02/03/2014  . 3-vessel CAD 06/24/2013  . ED (erectile dysfunction) of organic origin 07/07/2012  . Hypertension goal BP (blood pressure) < 140/80 04/29/2012  . Chondromalacia of left knee 03/19/2012  .  Hyperlipidemia with target LDL less than 100 02/12/2012  . Fibromyalgia 02/12/2012  . HIV (human immunodeficiency virus infection) ([place]) 08/27/2011  . Uncontrolled type 2 diabetes with neuropathy ([place]) 10/17/2000    Orientation RESPIRATION BLADDER Height & Weight     Self, Time, Situation, Place  Normal Continent Weight: 171 lb 8.3 oz (77.8 kg) Height:  (5 foot 7 inches)  BEHAVIORAL SYMPTOMS/MOOD NEUROLOGICAL BOWEL NUTRITION STATUS      Continent Diet(carb modified; cardiac)  AMBULATORY STATUS COMMUNICATION OF NEEDS Skin   Limited Assist Verbally Normal                       Personal Care Assistance Level of Assistance  Bathing, Dressing Bathing Assistance: Limited assistance   Dressing Assistance: Limited assistance     Functional Limitations Info             SPECIAL CARE FACTORS FREQUENCY  PT (By licensed PT), OT (By licensed OT)     PT Frequency: 5/wk OT Frequency: 5/wk            Contractures      Additional Factors Info  Code Status, Allergies Code Status Info: FULL Allergies Info: NKA           Current Medications (02/17/2018):  This is the current hospital active medication list Current Facility-Administered Medications  Medication Dose Route Frequency Provider Last Rate Last Dose  . 0.9 %  sodium chloride infusion  250 mL Intravenous PRN Opyd, Ilene Qua, MD      . acetaminophen (TYLENOL) tablet 650 mg  650 mg Oral Q6H PRN Opyd, Ilene Qua, MD       Or  . acetaminophen (TYLENOL) suppository  650 mg  650 mg Rectal Q6H PRN Opyd, Ilene Qua, MD      . albuterol (PROVENTIL) (2.5 MG/3ML) 0.083% nebulizer solution 3 mL  3 mL Inhalation Q4H PRN Opyd, Ilene Qua, MD      . allopurinol (ZYLOPRIM) tablet 300 mg  300 mg Oral Daily Opyd, Ilene Qua, MD   300 mg at 02/17/18 0852  . aspirin chewable tablet 81 mg  81 mg Oral Daily Opyd, Ilene Qua, MD   81 mg at 02/17/18 0853  . bisacodyl (DULCOLAX) EC tablet 5 mg  5 mg Oral Daily PRN Opyd, Ilene Qua, MD      .  ceFEPIme (MAXIPIME) 2 g in sodium chloride 0.9 % 100 mL IVPB  2 g Intravenous Q24H Opyd, Ilene Qua, MD   Stopped at 02/17/18 0603  . diltiazem (CARDIZEM CD) 24 hr capsule 120 mg  120 mg Oral Daily Opyd, Ilene Qua, MD   120 mg at 02/17/18 0852  . emtricitabine-rilpivir-tenofovir AF (ODEFSEY) 200-25-25 MG per tablet 1 tablet  1 tablet Oral Q breakfast Opyd, Ilene Qua, MD   1 tablet at 02/17/18 0852  . famotidine (PEPCID) tablet 20 mg  20 mg Oral Daily Opyd, Ilene Qua, MD   20 mg at 02/17/18 0852  . feeding supplement (GLUCERNA SHAKE) (GLUCERNA SHAKE) liquid 237 mL  237 mL Oral Daily Osei-Bonsu, Iona Beard, MD   237 mL at 02/17/18 0849  . gabapentin (NEURONTIN) capsule 400 mg  400 mg Oral BID Opyd, Ilene Qua, MD   400 mg at 02/17/18 0850  . heparin injection 5,000 Units  5,000 Units Subcutaneous Q8H Opyd, Ilene Qua, MD   5,000 Units at 02/17/18 0533  . hydrALAZINE (APRESOLINE) injection 10 mg  10 mg Intravenous Q4H PRN Opyd, Ilene Qua, MD      . HYDROcodone-acetaminophen (NORCO/VICODIN) 5-325 MG per tablet 1-2 tablet  1-2 tablet Oral Q4H PRN Opyd, Ilene Qua, MD   2 tablet at 02/17/18 0851  . hydrOXYzine (ATARAX/VISTARIL) tablet 25 mg  25 mg Oral TID PRN Opyd, Ilene Qua, MD      . insulin aspart (novoLOG) injection 0-15 Units  0-15 Units Subcutaneous TID WC Opyd, Ilene Qua, MD   3 Units at 02/17/18 0849  . insulin aspart (novoLOG) injection 0-5 Units  0-5 Units Subcutaneous QHS Vianne Bulls, MD   2 Units at 02/16/18 2143  . insulin aspart (novoLOG) injection 3 Units  3 Units Subcutaneous TID WC Adhikari, Amrit, MD      . insulin glargine (LANTUS) injection 30 Units  30 Units Subcutaneous QHS Adhikari, Amrit, MD      . isosorbide mononitrate (IMDUR) 24 hr tablet 30 mg  30 mg Oral Daily Opyd, Ilene Qua, MD   30 mg at 02/17/18 0852  . morphine 4 MG/ML injection 4 mg  4 mg Intravenous Q4H PRN Opyd, Ilene Qua, MD   4 mg at 02/14/18 0419  . ondansetron (ZOFRAN) tablet 4 mg  4 mg Oral Q6H PRN Opyd, Ilene Qua, MD        Or  . ondansetron (ZOFRAN) injection 4 mg  4 mg Intravenous Q6H PRN Opyd, Ilene Qua, MD   4 mg at 02/14/18 0418  . polyethylene glycol (MIRALAX / GLYCOLAX) packet 17 g  17 g Oral Daily Irene Pap N, DO   17 g at 02/17/18 0854  . pravastatin (PRAVACHOL) tablet 40 mg  40 mg Oral q1800 Opyd, Ilene Qua, MD   40 mg at 02/16/18 1731  . protein supplement (PREMIER  PROTEIN) liquid  11 oz Oral Q1500 Benito Mccreedy, MD   11 oz at 02/16/18 1729  . senna-docusate (Senokot-S) tablet 1 tablet  1 tablet Oral BID Irene Pap N, DO   1 tablet at 02/17/18 0854  . sertraline (ZOLOFT) tablet 50 mg  50 mg Oral Daily Opyd, Ilene Qua, MD   50 mg at 02/17/18 0853  . sodium chloride flush (NS) 0.9 % injection 3 mL  3 mL Intravenous Q12H Opyd, Ilene Qua, MD   3 mL at 02/16/18 2144  . sodium chloride flush (NS) 0.9 % injection 3 mL  3 mL Intravenous Q12H Opyd, Ilene Qua, MD   3 mL at 02/17/18 0855  . sodium chloride flush (NS) 0.9 % injection 3 mL  3 mL Intravenous PRN Opyd, Ilene Qua, MD      . traZODone (DESYREL) tablet 50 mg  50 mg Oral QHS PRN Opyd, Ilene Qua, MD   50 mg at 02/16/18 2140  . vancomycin (VANCOCIN) IVPB 1000 mg/200 mL premix  1,000 mg Intravenous Q24H Opyd, Ilene Qua, MD   Stopped at 02/17/18 1657     Discharge Medications: Please see discharge summary for a list of discharge medications.  Relevant Imaging Results:  Relevant Lab Results:   Additional Information SS#: [id number]  Jorge Ny, LCSW

## 2018-02-18 DIAGNOSIS — Z951 Presence of aortocoronary bypass graft: Secondary | ICD-10-CM | POA: Diagnosis not present

## 2018-02-18 DIAGNOSIS — J189 Pneumonia, unspecified organism: Secondary | ICD-10-CM | POA: Diagnosis not present

## 2018-02-18 DIAGNOSIS — K5900 Constipation, unspecified: Secondary | ICD-10-CM | POA: Diagnosis not present

## 2018-02-18 DIAGNOSIS — M25511 Pain in right shoulder: Secondary | ICD-10-CM | POA: Diagnosis not present

## 2018-02-19 ENCOUNTER — Telehealth: Payer: Self-pay | Admitting: *Deleted

## 2018-02-19 DIAGNOSIS — M25511 Pain in right shoulder: Secondary | ICD-10-CM | POA: Diagnosis not present

## 2018-02-19 LAB — CULTURE, BLOOD (ROUTINE X 2)
CULTURE: NO GROWTH
Culture: NO GROWTH
SPECIAL REQUESTS: ADEQUATE
Special Requests: ADEQUATE

## 2018-02-19 NOTE — Telephone Encounter (Signed)
Patient was hospitalized with pneumonia, discharged to skilled facility on 5/14. They are calling to see if he should follow up with Dr [name] before his already-scheduled appointment 7/16. Please advise if you would like to see him sooner due to his recent hospitalization. Landis Gandy, RN

## 2018-02-19 NOTE — Telephone Encounter (Signed)
Could I see him in June? thanks

## 2018-02-19 NOTE — Telephone Encounter (Signed)
Left message at [facility] [phone], transportation scheduling, notifying them of Michael [name]'s appointment 6/4 at 1:45. Asked for a return call to confirm.

## 2018-02-20 ENCOUNTER — Emergency Department (HOSPITAL_COMMUNITY): Payer: Medicare Other

## 2018-02-20 ENCOUNTER — Encounter (HOSPITAL_COMMUNITY): Payer: Self-pay | Admitting: Emergency Medicine

## 2018-02-20 ENCOUNTER — Other Ambulatory Visit: Payer: Self-pay

## 2018-02-20 ENCOUNTER — Inpatient Hospital Stay (HOSPITAL_COMMUNITY)
Admission: EM | Admit: 2018-02-20 | Discharge: 2018-02-27 | DRG: 194 | Disposition: A | Discharge status: Skilled Nursing Facility | Payer: Medicare Other | Attending: Internal Medicine | Admitting: Internal Medicine

## 2018-02-20 DIAGNOSIS — Z955 Presence of coronary angioplasty implant and graft: Secondary | ICD-10-CM

## 2018-02-20 DIAGNOSIS — N183 Chronic kidney disease, stage 3 unspecified: Secondary | ICD-10-CM | POA: Diagnosis present

## 2018-02-20 DIAGNOSIS — Z86711 Personal history of pulmonary embolism: Secondary | ICD-10-CM

## 2018-02-20 DIAGNOSIS — I5030 Unspecified diastolic (congestive) heart failure: Secondary | ICD-10-CM | POA: Diagnosis not present

## 2018-02-20 DIAGNOSIS — R402413 Glasgow coma scale score 13-15, at hospital admission: Secondary | ICD-10-CM | POA: Diagnosis not present

## 2018-02-20 DIAGNOSIS — B20 Human immunodeficiency virus [HIV] disease: Secondary | ICD-10-CM | POA: Diagnosis not present

## 2018-02-20 DIAGNOSIS — K219 Gastro-esophageal reflux disease without esophagitis: Secondary | ICD-10-CM | POA: Diagnosis present

## 2018-02-20 DIAGNOSIS — IMO0002 Reserved for concepts with insufficient information to code with codable children: Secondary | ICD-10-CM

## 2018-02-20 DIAGNOSIS — F1420 Cocaine dependence, uncomplicated: Secondary | ICD-10-CM | POA: Diagnosis not present

## 2018-02-20 DIAGNOSIS — J181 Lobar pneumonia, unspecified organism: Secondary | ICD-10-CM | POA: Diagnosis not present

## 2018-02-20 DIAGNOSIS — Z7982 Long term (current) use of aspirin: Secondary | ICD-10-CM

## 2018-02-20 DIAGNOSIS — I10 Essential (primary) hypertension: Secondary | ICD-10-CM | POA: Diagnosis present

## 2018-02-20 DIAGNOSIS — E1165 Type 2 diabetes mellitus with hyperglycemia: Secondary | ICD-10-CM | POA: Diagnosis not present

## 2018-02-20 DIAGNOSIS — E1140 Type 2 diabetes mellitus with diabetic neuropathy, unspecified: Secondary | ICD-10-CM | POA: Diagnosis not present

## 2018-02-20 DIAGNOSIS — J189 Pneumonia, unspecified organism: Secondary | ICD-10-CM | POA: Diagnosis present

## 2018-02-20 DIAGNOSIS — Z21 Asymptomatic human immunodeficiency virus [HIV] infection status: Secondary | ICD-10-CM | POA: Diagnosis present

## 2018-02-20 DIAGNOSIS — J45909 Unspecified asthma, uncomplicated: Secondary | ICD-10-CM | POA: Diagnosis not present

## 2018-02-20 DIAGNOSIS — F32A Depression, unspecified: Secondary | ICD-10-CM | POA: Diagnosis present

## 2018-02-20 DIAGNOSIS — N433 Hydrocele, unspecified: Secondary | ICD-10-CM | POA: Diagnosis present

## 2018-02-20 DIAGNOSIS — M359 Systemic involvement of connective tissue, unspecified: Secondary | ICD-10-CM | POA: Diagnosis not present

## 2018-02-20 DIAGNOSIS — Z8659 Personal history of other mental and behavioral disorders: Secondary | ICD-10-CM

## 2018-02-20 DIAGNOSIS — Z79899 Other long term (current) drug therapy: Secondary | ICD-10-CM

## 2018-02-20 DIAGNOSIS — N50812 Left testicular pain: Secondary | ICD-10-CM

## 2018-02-20 DIAGNOSIS — R0789 Other chest pain: Secondary | ICD-10-CM | POA: Diagnosis not present

## 2018-02-20 DIAGNOSIS — E1122 Type 2 diabetes mellitus with diabetic chronic kidney disease: Secondary | ICD-10-CM | POA: Diagnosis present

## 2018-02-20 DIAGNOSIS — I319 Disease of pericardium, unspecified: Secondary | ICD-10-CM | POA: Diagnosis not present

## 2018-02-20 DIAGNOSIS — I970 Postcardiotomy syndrome: Secondary | ICD-10-CM | POA: Diagnosis not present

## 2018-02-20 DIAGNOSIS — Z951 Presence of aortocoronary bypass graft: Secondary | ICD-10-CM

## 2018-02-20 DIAGNOSIS — Y95 Nosocomial condition: Secondary | ICD-10-CM | POA: Diagnosis present

## 2018-02-20 DIAGNOSIS — I4891 Unspecified atrial fibrillation: Secondary | ICD-10-CM | POA: Diagnosis present

## 2018-02-20 DIAGNOSIS — Z9079 Acquired absence of other genital organ(s): Secondary | ICD-10-CM | POA: Diagnosis not present

## 2018-02-20 DIAGNOSIS — R52 Pain, unspecified: Secondary | ICD-10-CM

## 2018-02-20 DIAGNOSIS — F1410 Cocaine abuse, uncomplicated: Secondary | ICD-10-CM | POA: Diagnosis not present

## 2018-02-20 DIAGNOSIS — I2510 Atherosclerotic heart disease of native coronary artery without angina pectoris: Secondary | ICD-10-CM | POA: Diagnosis present

## 2018-02-20 DIAGNOSIS — E785 Hyperlipidemia, unspecified: Secondary | ICD-10-CM | POA: Diagnosis not present

## 2018-02-20 DIAGNOSIS — I129 Hypertensive chronic kidney disease with stage 1 through stage 4 chronic kidney disease, or unspecified chronic kidney disease: Secondary | ICD-10-CM | POA: Diagnosis not present

## 2018-02-20 DIAGNOSIS — R0602 Shortness of breath: Secondary | ICD-10-CM

## 2018-02-20 DIAGNOSIS — N453 Epididymo-orchitis: Secondary | ICD-10-CM | POA: Diagnosis not present

## 2018-02-20 DIAGNOSIS — R072 Precordial pain: Secondary | ICD-10-CM | POA: Diagnosis present

## 2018-02-20 DIAGNOSIS — M109 Gout, unspecified: Secondary | ICD-10-CM | POA: Diagnosis not present

## 2018-02-20 DIAGNOSIS — R7989 Other specified abnormal findings of blood chemistry: Secondary | ICD-10-CM | POA: Diagnosis not present

## 2018-02-20 DIAGNOSIS — N179 Acute kidney failure, unspecified: Secondary | ICD-10-CM | POA: Diagnosis present

## 2018-02-20 DIAGNOSIS — Z833 Family history of diabetes mellitus: Secondary | ICD-10-CM | POA: Diagnosis not present

## 2018-02-20 DIAGNOSIS — M94262 Chondromalacia, left knee: Secondary | ICD-10-CM | POA: Diagnosis not present

## 2018-02-20 DIAGNOSIS — R079 Chest pain, unspecified: Secondary | ICD-10-CM

## 2018-02-20 DIAGNOSIS — Z905 Acquired absence of kidney: Secondary | ICD-10-CM

## 2018-02-20 DIAGNOSIS — Z794 Long term (current) use of insulin: Secondary | ICD-10-CM

## 2018-02-20 DIAGNOSIS — B1920 Unspecified viral hepatitis C without hepatic coma: Secondary | ICD-10-CM | POA: Diagnosis present

## 2018-02-20 DIAGNOSIS — R071 Chest pain on breathing: Secondary | ICD-10-CM | POA: Diagnosis not present

## 2018-02-20 DIAGNOSIS — E1151 Type 2 diabetes mellitus with diabetic peripheral angiopathy without gangrene: Secondary | ICD-10-CM | POA: Diagnosis present

## 2018-02-20 DIAGNOSIS — F329 Major depressive disorder, single episode, unspecified: Secondary | ICD-10-CM | POA: Diagnosis present

## 2018-02-20 DIAGNOSIS — Z8701 Personal history of pneumonia (recurrent): Secondary | ICD-10-CM | POA: Diagnosis not present

## 2018-02-20 LAB — BASIC METABOLIC PANEL
ANION GAP: 11 (ref 5–15)
BUN: 23 mg/dL — ABNORMAL HIGH (ref 6–20)
CALCIUM: 9.3 mg/dL (ref 8.9–10.3)
CO2: 24 mmol/L (ref 22–32)
CREATININE: 1.78 mg/dL — AB (ref 0.61–1.24)
Chloride: 102 mmol/L (ref 101–111)
GFR, EST AFRICAN AMERICAN: 44 mL/min — AB (ref >60)
GFR, EST NON AFRICAN AMERICAN: 38 mL/min — AB (ref >60)
Glucose, Bld: 152 mg/dL — ABNORMAL HIGH (ref 65–99)
Potassium: 4.5 mmol/L (ref 3.5–5.1)
SODIUM: 137 mmol/L (ref 135–145)

## 2018-02-20 LAB — CBC
HEMATOCRIT: 31.1 % — AB (ref 39.0–52.0)
Hemoglobin: 10 g/dL — ABNORMAL LOW (ref 13.0–17.0)
MCH: 26.5 pg (ref 26.0–34.0)
MCHC: 32.2 g/dL (ref 30.0–36.0)
MCV: 82.3 fL (ref 78.0–100.0)
Platelets: 259 10*3/uL (ref 150–400)
RBC: 3.78 MIL/uL — ABNORMAL LOW (ref 4.22–5.81)
RDW: 13.1 % (ref 11.5–15.5)
WBC: 8.2 10*3/uL (ref 4.0–10.5)

## 2018-02-20 LAB — URINALYSIS, ROUTINE W REFLEX MICROSCOPIC
BILIRUBIN URINE: NEGATIVE
GLUCOSE, UA: 50 mg/dL — AB
HGB URINE DIPSTICK: NEGATIVE
KETONES UR: NEGATIVE mg/dL
NITRITE: NEGATIVE
PROTEIN: 100 mg/dL — AB
Specific Gravity, Urine: 1.023 (ref 1.005–1.030)
pH: 6 (ref 5.0–8.0)

## 2018-02-20 LAB — I-STAT TROPONIN, ED
Troponin i, poc: 0.09 ng/mL (ref 0.00–0.08)
Troponin i, poc: 0.11 ng/mL (ref 0.00–0.08)

## 2018-02-20 LAB — GLUCOSE, CAPILLARY: Glucose-Capillary: 161 mg/dL — ABNORMAL HIGH (ref 65–99)

## 2018-02-20 LAB — RAPID URINE DRUG SCREEN, HOSP PERFORMED
Amphetamines: NOT DETECTED
Barbiturates: NOT DETECTED
Benzodiazepines: NOT DETECTED
COCAINE: NOT DETECTED
OPIATES: POSITIVE — AB
Tetrahydrocannabinol: NOT DETECTED

## 2018-02-20 LAB — D-DIMER, QUANTITATIVE: D-Dimer, Quant: 1.7 ug/mL-FEU — ABNORMAL HIGH (ref 0.00–0.50)

## 2018-02-20 LAB — CBG MONITORING, ED: Glucose-Capillary: 125 mg/dL — ABNORMAL HIGH (ref 65–99)

## 2018-02-20 LAB — BRAIN NATRIURETIC PEPTIDE: B NATRIURETIC PEPTIDE 5: 83 pg/mL (ref 0.0–100.0)

## 2018-02-20 MED ORDER — ALPRAZOLAM 0.25 MG PO TABS
0.2500 mg | ORAL_TABLET | Freq: Two times a day (BID) | ORAL | Status: DC | PRN
  Administered 2018-02-23 – 2018-02-25 (×3): 0.25 mg via ORAL
  Filled 2018-02-20 (×4): qty 1

## 2018-02-20 MED ORDER — ONDANSETRON HCL 4 MG/2ML IJ SOLN: 4.0000 mg | Freq: Four times a day (QID) | INTRAMUSCULAR | Status: DC | PRN

## 2018-02-20 MED ORDER — ALBUTEROL SULFATE (2.5 MG/3ML) 0.083% IN NEBU: 2.5000 mg | INHALATION_SOLUTION | RESPIRATORY_TRACT | Status: DC | PRN

## 2018-02-20 MED ORDER — IOPAMIDOL (ISOVUE-370) INJECTION 76%
INTRAVENOUS | Status: AC
  Administered 2018-02-20: 80 mL
  Filled 2018-02-20: qty 100

## 2018-02-20 MED ORDER — SERTRALINE HCL 50 MG PO TABS
50.0000 mg | ORAL_TABLET | Freq: Every day | ORAL | Status: DC
Start: 2018-02-21 — End: 2018-02-27
  Administered 2018-02-21 – 2018-02-27 (×7): 50 mg via ORAL
  Filled 2018-02-20 (×7): qty 1

## 2018-02-20 MED ORDER — DILTIAZEM HCL ER COATED BEADS 120 MG PO CP24
120.0000 mg | ORAL_CAPSULE | Freq: Every day | ORAL | Status: DC
Start: 2018-02-21 — End: 2018-02-27
  Administered 2018-02-21 – 2018-02-27 (×7): 120 mg via ORAL
  Filled 2018-02-20 (×7): qty 1

## 2018-02-20 MED ORDER — ACETAMINOPHEN 325 MG PO TABS: 650.0000 mg | ORAL_TABLET | ORAL | Status: DC | PRN

## 2018-02-20 MED ORDER — GABAPENTIN 400 MG PO CAPS
400.0000 mg | ORAL_CAPSULE | Freq: Two times a day (BID) | ORAL | Status: DC
  Administered 2018-02-20 – 2018-02-27 (×14): 400 mg via ORAL
  Filled 2018-02-20 (×14): qty 1

## 2018-02-20 MED ORDER — MORPHINE SULFATE (PF) 4 MG/ML IV SOLN
4.0000 mg | Freq: Once | INTRAVENOUS | Status: AC
  Administered 2018-02-20: 4 mg via INTRAVENOUS
  Filled 2018-02-20: qty 1

## 2018-02-20 MED ORDER — ENOXAPARIN SODIUM 40 MG/0.4ML ~~LOC~~ SOLN
40.0000 mg | SUBCUTANEOUS | Status: DC
  Administered 2018-02-21 – 2018-02-26 (×7): 40 mg via SUBCUTANEOUS
  Filled 2018-02-20 (×7): qty 0.4

## 2018-02-20 MED ORDER — INSULIN ASPART 100 UNIT/ML ~~LOC~~ SOLN
0.0000 [IU] | Freq: Three times a day (TID) | SUBCUTANEOUS | Status: DC
  Administered 2018-02-21: 1 [IU] via SUBCUTANEOUS
  Administered 2018-02-21: 2 [IU] via SUBCUTANEOUS
  Administered 2018-02-22: 1 [IU] via SUBCUTANEOUS
  Administered 2018-02-22 – 2018-02-23 (×2): 3 [IU] via SUBCUTANEOUS
  Administered 2018-02-23 (×2): 2 [IU] via SUBCUTANEOUS
  Administered 2018-02-24: 5 [IU] via SUBCUTANEOUS
  Administered 2018-02-24: 1 [IU] via SUBCUTANEOUS
  Administered 2018-02-25: 7 [IU] via SUBCUTANEOUS
  Administered 2018-02-25: 3 [IU] via SUBCUTANEOUS
  Administered 2018-02-25: 1 [IU] via SUBCUTANEOUS
  Administered 2018-02-26: 7 [IU] via SUBCUTANEOUS

## 2018-02-20 MED ORDER — LEVOFLOXACIN IN D5W 500 MG/100ML IV SOLN
500.0000 mg | INTRAVENOUS | Status: DC
  Administered 2018-02-20: 500 mg via INTRAVENOUS
  Filled 2018-02-20: qty 100

## 2018-02-20 MED ORDER — ALLOPURINOL 300 MG PO TABS
300.0000 mg | ORAL_TABLET | Freq: Every day | ORAL | Status: DC
  Administered 2018-02-21 – 2018-02-27 (×7): 300 mg via ORAL
  Filled 2018-02-20 (×7): qty 1

## 2018-02-20 MED ORDER — ISOSORBIDE MONONITRATE ER 30 MG PO TB24
30.0000 mg | ORAL_TABLET | Freq: Every day | ORAL | Status: DC
Start: 2018-02-21 — End: 2018-02-27
  Administered 2018-02-21 – 2018-02-27 (×7): 30 mg via ORAL
  Filled 2018-02-20 (×7): qty 1

## 2018-02-20 MED ORDER — HYDRALAZINE HCL 20 MG/ML IJ SOLN: 5.0000 mg | INTRAMUSCULAR | Status: DC | PRN

## 2018-02-20 MED ORDER — DM-GUAIFENESIN ER 30-600 MG PO TB12
1.0000 | ORAL_TABLET | Freq: Two times a day (BID) | ORAL | Status: DC | PRN
  Administered 2018-02-25: 1 via ORAL
  Filled 2018-02-20: qty 1

## 2018-02-20 MED ORDER — SULFAMETHOXAZOLE-TRIMETHOPRIM 800-160 MG PO TABS
1.0000 | ORAL_TABLET | Freq: Once | ORAL | Status: AC
  Administered 2018-02-20: 1 via ORAL
  Filled 2018-02-20: qty 1

## 2018-02-20 MED ORDER — MORPHINE SULFATE (PF) 4 MG/ML IV SOLN
2.0000 mg | INTRAVENOUS | Status: DC | PRN
  Administered 2018-02-20 – 2018-02-27 (×16): 2 mg via INTRAVENOUS
  Filled 2018-02-20 (×16): qty 1

## 2018-02-20 MED ORDER — INSULIN GLARGINE 100 UNIT/ML ~~LOC~~ SOLN
20.0000 [IU] | Freq: Every day | SUBCUTANEOUS | Status: DC
  Administered 2018-02-21 – 2018-02-26 (×7): 20 [IU] via SUBCUTANEOUS
  Filled 2018-02-20 (×7): qty 0.2

## 2018-02-20 MED ORDER — ZOLPIDEM TARTRATE 5 MG PO TABS
5.0000 mg | ORAL_TABLET | Freq: Every evening | ORAL | Status: DC | PRN
  Administered 2018-02-21 – 2018-02-22 (×3): 5 mg via ORAL
  Filled 2018-02-20 (×3): qty 1

## 2018-02-20 MED ORDER — HYDROCHLOROTHIAZIDE 12.5 MG PO CAPS
12.5000 mg | ORAL_CAPSULE | Freq: Every day | ORAL | Status: DC
  Administered 2018-02-21 – 2018-02-23 (×3): 12.5 mg via ORAL
  Filled 2018-02-20 (×3): qty 1

## 2018-02-20 MED ORDER — HYDROXYZINE HCL 25 MG PO TABS
25.0000 mg | ORAL_TABLET | Freq: Three times a day (TID) | ORAL | Status: DC | PRN
  Administered 2018-02-25 – 2018-02-27 (×2): 25 mg via ORAL
  Filled 2018-02-20 (×2): qty 1

## 2018-02-20 MED ORDER — AMANTADINE HCL 100 MG PO CAPS
100.0000 mg | ORAL_CAPSULE | Freq: Every day | ORAL | Status: DC
  Administered 2018-02-21 – 2018-02-27 (×7): 100 mg via ORAL
  Filled 2018-02-20 (×7): qty 1

## 2018-02-20 MED ORDER — LOSARTAN POTASSIUM 50 MG PO TABS
50.0000 mg | ORAL_TABLET | Freq: Every day | ORAL | Status: DC
  Administered 2018-02-21 – 2018-02-23 (×3): 50 mg via ORAL
  Filled 2018-02-20 (×3): qty 1

## 2018-02-20 MED ORDER — PRAVASTATIN SODIUM 40 MG PO TABS
40.0000 mg | ORAL_TABLET | Freq: Every day | ORAL | Status: DC
  Administered 2018-02-20 – 2018-02-26 (×7): 40 mg via ORAL
  Filled 2018-02-20 (×7): qty 1

## 2018-02-20 MED ORDER — ASPIRIN 81 MG PO CHEW
81.0000 mg | CHEWABLE_TABLET | Freq: Every day | ORAL | Status: DC
  Administered 2018-02-21 – 2018-02-27 (×7): 81 mg via ORAL
  Filled 2018-02-20 (×7): qty 1

## 2018-02-20 MED ORDER — EMTRICITAB-RILPIVIR-TENOFOV AF 200-25-25 MG PO TABS
1.0000 | ORAL_TABLET | Freq: Every day | ORAL | Status: DC
  Administered 2018-02-21 – 2018-02-27 (×7): 1 via ORAL
  Filled 2018-02-20 (×8): qty 1

## 2018-02-20 MED ORDER — TRAZODONE HCL 50 MG PO TABS: 50.0000 mg | ORAL_TABLET | Freq: Every evening | ORAL | Status: DC | PRN

## 2018-02-20 MED ORDER — NITROGLYCERIN 0.4 MG SL SUBL: 0.4000 mg | SUBLINGUAL_TABLET | SUBLINGUAL | Status: DC | PRN

## 2018-02-20 MED ORDER — AMOXICILLIN-POT CLAVULANATE 875-125 MG PO TABS
1.0000 | ORAL_TABLET | Freq: Two times a day (BID) | ORAL | Status: DC
  Administered 2018-02-20 – 2018-02-21 (×2): 1 via ORAL
  Filled 2018-02-20 (×2): qty 1

## 2018-02-20 MED ORDER — FAMOTIDINE 20 MG PO TABS
20.0000 mg | ORAL_TABLET | Freq: Every day | ORAL | Status: DC
  Administered 2018-02-21 – 2018-02-27 (×8): 20 mg via ORAL
  Filled 2018-02-20 (×8): qty 1

## 2018-02-20 NOTE — ED Provider Notes (Signed)
Complains of left anterior chest pain onset 3 days ago, gradually pain is pleuritic.  Associated symptoms include shortness of breath.  He also complains of left testicular pain for the past 2 days.  Nothing makes symptoms better or worse.  No other associated symptoms.  Treated with sublingual nitroglycerin and aspirin prior to coming here.  On exam alert no distress HEENT exam no facial asymmetry neck is supple trachea midline chest midline sternotomy wound, appears well-healing.  Chest is minimally tender at the left side anteriorly.  Lungs clear to auscultation heart regular rate and rhythm abdomen nondistended normal active bowel sounds tender at left lower quadrant.  Scrotum tender at left testicle   Orlie Dakin, MD 02/21/18 [phone]

## 2018-02-20 NOTE — Progress Notes (Signed)
Pharmacy Antibiotic Note  Michael [name] is a 67 y.o. male admitted on 02/20/2018 with chest pain.  Pharmacy has been consulted for levaquin dosing for epididymal orchitis. Pt is afebrile and WBC is WNL. SCr is elevated at 1.78 but appears to be patients baseline.   Plan: Levaquin 500mg  IV Q24H F/u renal fxn, C&S, clinical status and LOT     Temp (24hrs), Avg:98.5 F (36.9 C), Min:98.5 F (36.9 C), Max:98.5 F (36.9 C)  Recent Labs  Lab 02/14/18 0530 02/17/18 0301 02/20/18 1318  WBC 6.7  --  8.2  CREATININE 1.80* 1.60* 1.78*    Estimated Creatinine Clearance: 39 mL/min (A) (by C-G formula based on SCr of 1.78 mg/dL (H)).    No Known Allergies  Thank you for allowing pharmacy to be a part of this patient's care.  Michael [name], Michael [name] 02/20/2018 10:02 PM

## 2018-02-20 NOTE — Consult Note (Signed)
CARDIOLOGY CONSULT NOTE   Referring Physician: Dr. Blaine Hamper Primary Physician: Dr. Domenica Fail Primary Cardiologist: Roselyn Bering Reason for Consultation: chest pain, positive troponin   HPI: Michael [name] is a 67 y.o. male w/ history of CAD s/p CABG 3 weeks ago, CKD, substance abuse, HTN, HIV, HCV, DM2, carotid disease s/p CEA who presents with chest pain.   Patient underwent elective 3vCABG on 01/28/18 due to escalating exertional chest pain and 3 vessel CAD. His post-operative course was uncomplicated and he was discharged to rehab in good condition. After discharge, the patient reports experiencing a marked improvement in his chest pain. Approximately 1 week ago he developed chills and cough and was admitted to the hospital for pneumonia. During this admission he developed chest pain. A CT PE scan was negative for PE. A troponin was checked and was minimally elevated at 0.09. His ECG was without acute ischemic changes. He was treated with antibiotics and discharged back to his SNF.  Since discharge, the patient reports continued issues with chest pain. This is present nearly all day, but is worsened with coughing and with ambulation. The pain is located in the left chest and radiates to the right chest near the axilla. There is no positional component to the pain. Nothing he does seems to make it better. There is no pain at the sternotomy site. He denies recent fevers, SOB, palpitations, lightheadedness, dizziness.   Review of Systems:     Cardiac Review of Systems: {Y] = yes '[ ]'  = no  Chest Pain [  Y  ]  Resting SOB [   ] Exertional SOB  [  ]  Orthopnea [  ]   Pedal Edema [   ]    Palpitations [  ] Syncope  [  ]   Presyncope [   ]  General Review of Systems: [Y] = yes [  ]=no Constitional: recent weight change [  ]; anorexia [  ]; fatigue [  ]; nausea [  ]; night sweats [ Y ]; fever [  ]; or chills [  ];                                                                     Eyes : blurred  vision [  ]; diplopia [   ]; vision changes [  ];  Amaurosis fugax[  ]; Resp: cough [  ];  wheezing[  ];  hemoptysis[  ];  PND [  ];  GI:  gallstones[  ], vomiting[  ];  dysphagia[  ]; melena[  ];  hematochezia [  ]; heartburn[  ];   GU: kidney stones [  ]; hematuria[  ];   dysuria [  ];  nocturia[  ]; incontinence [  ];             Skin: rash, swelling[  ];, hair loss[  ];  peripheral edema[  ];  or itching[  ]; Musculosketetal: myalgias[  ];  joint swelling[  ];  joint erythema[  ];  joint pain[  ];  back pain[  ];  Heme/Lymph: bruising[  ];  bleeding[  ];  anemia[  ];  Neuro: TIA[  ];  headaches[  ];  stroke[  ];  vertigo[  ];  seizures[  ];   paresthesias[  ];  difficulty walking[  ];  Psych:depression[  ]; anxiety[  ];  Endocrine: diabetes[  ];  thyroid dysfunction[  ];  Other:  Past Medical History:  Diagnosis Date  . A-fib ([place])   . Asthma    No PFTs, history of childhood asthma  . CAD (coronary artery disease)    a. 06/2013 STEMI/PCI ([facility]): LAD w/ thrombus (treated with BMS), mid 75%, D2 75%; LCX OM2 75%; RCA small, PDA 95%, PLV 95%;  b. 10/2013 Cath/PCI: ISR w/in LAD (Promus DES x 2), borderline OM2 lesion;  c. 01/2014 MV: Intermediate risk, medium-sized distal ant wall infarct w/ very small amt of peri-infarct ischemia. EF 60%.  . Cellulitis 04/2014   left facial  . Chondromalacia of medial femoral condyle    Left knee MRI 04/28/12: Chondromalacia of the medial femoral condyle with slight peripheral degeneration of the meniscocapsular junction of the medial meniscus; followed by sports medicine  . Collagen vascular disease ([place])   . Crack cocaine use    for 20+ years, has been enrolled in detox programs in the past  . Depression    with history of hospitalization for suicidal ideation  . Diabetes mellitus 2002   Diagnosed in 2002, started insulin in 2012  . Gout   . Gout 04/28/2012  . Headache(784.0)    CT head 08/2011: Periventricular and subcortical white matter hypodensities  are most in keeping with chronic microangiopathic change  . HIV infection [facility]) Nov 2012   Followed by [name]  . Hyperlipidemia   . Hypertension   . Pulmonary embolism ([name])     Medications Prior to Admission  Medication Sig Dispense Refill  . albuterol (PROAIR HFA) 108 (90 Base) MCG/ACT inhaler INHALE TWO PUFFS INTO THE LUNGS EVERY 6 (SIX) HOURS AS NEEDED FOR WHEEZING OR SHORTNESS OF BREATH. 8.5 Inhaler 0  . allopurinol (ZYLOPRIM) 300 MG tablet Take 1 tablet (300 mg total) by mouth daily. For gout. 30 tablet 0  . amantadine (SYMMETREL) 100 MG capsule Take 100 mg by mouth daily.    Marland Kitchen amoxicillin-clavulanate (AUGMENTIN) 875-125 MG tablet Take 1 tablet by mouth every 12 (twelve) hours for 4 days. 8 tablet 0  . aspirin 81 MG chewable tablet Chew 81 mg by mouth daily.    Marland Kitchen diltiazem (CARDIZEM CD) 120 MG 24 hr capsule Take 1 capsule (120 mg total) by mouth daily. For high blood pressure 30 capsule 0  . emtricitabine-rilpivir-tenofovir AF (ODEFSEY) 200-25-25 MG TABS tablet Take 1 tablet by mouth daily with breakfast. For HIV infection 1 tablet 0  . famotidine (PEPCID) 20 MG tablet Take 1 tablet (20 mg total) by mouth daily. For acid reflux 30 tablet 0  . gabapentin (NEURONTIN) 400 MG capsule Take 1 capsule (400 mg total) by mouth 2 (two) times daily. For agitation/neuropathic pain (Patient taking differently: Take 400 mg by mouth 2 (two) times daily as needed (For agitation/neuropathic pain). ) 60 capsule 0  . hydrochlorothiazide (MICROZIDE) 12.5 MG capsule Take 1 capsule (12.5 mg total) by mouth daily. For high blood pressure 30 capsule 0  . hydrOXYzine (ATARAX/VISTARIL) 25 MG tablet Take 1 tablet (25 mg total) by mouth 3 (three) times daily as needed for anxiety. 90 tablet 0  . insulin glargine (LANTUS) 100 UNIT/ML injection Inject 0.3 mLs (30 Units total) into the skin at bedtime. For diabetes management 10 mL 0  . insulin lispro (HUMALOG) 100 UNIT/ML injection Inject 0.05 mLs (5 Units total)  into the  skin 3 (three) times daily with meals. For diabetes management (Patient taking differently: Inject 4 Units into the skin 3 (three) times daily with meals. For diabetes management) 10 mL 0  . isosorbide mononitrate (IMDUR) 30 MG 24 hr tablet Take 1 tablet (30 mg total) by mouth daily. For chest 30 tablet 0  . losartan (COZAAR) 50 MG tablet Take 1 tablet (50 mg total) by mouth daily. For high blood pressure 30 tablet 0  . metFORMIN (GLUCOPHAGE) 1000 MG tablet Take 1 tablet (1,000 mg total) by mouth 2 (two) times daily with a meal. For diabetes management 60 tablet 0  . pravastatin (PRAVACHOL) 40 MG tablet Take 1 tablet (40 mg total) by mouth at bedtime. For high cholesterol 30 tablet 0  . sertraline (ZOLOFT) 50 MG tablet Take 1 tablet (50 mg total) by mouth daily. For depression 30 tablet 0  . traZODone (DESYREL) 50 MG tablet Take 1 tablet (50 mg total) by mouth at bedtime as needed for sleep. 30 tablet 0  . ACCU-CHEK SOFTCLIX LANCETS lancets USE TO CHECK BLOOD GLUCOSE LEVELS UP TO 4 TIMES DAILY AS DIRECTED 100 each 5  . Insulin Syringe-Needle U-100 (B-D INS SYR MICROFINE 1CC/27G) 27G X 5/8" 1 ML MISC 1 strip by Does not apply route 3 (three) times daily as needed. For blood sugar checks 100 each 0     . [START ON 02/21/2018] allopurinol  300 mg Oral Daily  . [START ON 02/21/2018] amantadine  100 mg Oral Daily  . amoxicillin-clavulanate  1 tablet Oral Q12H  . aspirin  81 mg Oral Daily  . [START ON 02/21/2018] diltiazem  120 mg Oral Daily  . [START ON 02/21/2018] emtricitabine-rilpivir-tenofovir AF  1 tablet Oral Q breakfast  . enoxaparin (LOVENOX) injection  40 mg Subcutaneous Q24H  . famotidine  20 mg Oral Daily  . gabapentin  400 mg Oral BID  . [START ON 02/21/2018] hydrochlorothiazide  12.5 mg Oral Daily  . [START ON 02/21/2018] insulin aspart  0-9 Units Subcutaneous TID WC  . insulin glargine  20 Units Subcutaneous QHS  . [START ON 02/21/2018] isosorbide mononitrate  30 mg Oral Daily  .  [START ON 02/21/2018] losartan  50 mg Oral Daily  . pravastatin  40 mg Oral QHS  . [START ON 02/21/2018] sertraline  50 mg Oral Daily    Infusions: . levofloxacin (LEVAQUIN) IV 500 mg (02/20/18 2302)    No Known Allergies  Social History   Socioeconomic History  . Marital status: Divorced    Spouse name: Not on file  . Number of children: Not on file  . Years of education: 31  . Highest education level: Not on file  Occupational History    Employer: UNEMPLOYED    Comment: 04/2016  Social Needs  . Financial resource strain: Not on file  . Food insecurity:    Worry: Not on file    Inability: Not on file  . Transportation needs:    Medical: Not on file    Non-medical: Not on file  Tobacco Use  . Smoking status: Never Smoker  . Smokeless tobacco: Never Used  Substance and Sexual Activity  . Alcohol use: No    Alcohol/week: 2.4 oz    Types: 2 Cans of beer, 2 Shots of liquor per week  . Drug use: Yes    Types: "Crack" cocaine, Cocaine    Comment: last use January 2018  . Sexual activity: Yes    Comment: accepted condoms  Lifestyle  . Physical  activity:    Days per week: Not on file    Minutes per session: Not on file  . Stress: Not on file  Relationships  . Social connections:    Talks on phone: Not on file    Gets together: Not on file    Attends religious service: Not on file    Active member of club or organization: Not on file    Attends meetings of clubs or organizations: Not on file    Relationship status: Not on file  . Intimate partner violence:    Fear of current or ex partner: Not on file    Emotionally abused: Not on file    Physically abused: Not on file    Forced sexual activity: Not on file  Other Topics Concern  . Not on file  Social History Narrative   Currently staying with a friend in [place].  Was staying @ local motel until a few days ago - left b/c of bed bugs.    Family History  Problem Relation Age of Onset  . Diabetes Mother   .  Hypertension Mother   . Hyperlipidemia Mother   . Diabetes Father   . Cancer Father   . Hypertension Father   . Diabetes Brother   . Heart disease Brother   . Diabetes Sister   . Colon cancer Neg Hx     PHYSICAL EXAM: Vitals:   02/20/18 2300 02/20/18 2331  BP: (!) 138/91 (!) 135/97  Pulse: 96 98  Resp: 18 18  Temp:    SpO2: 100% 100%     Intake/Output Summary (Last 24 hours) at 02/20/2018 2344 Last data filed at 02/20/2018 2245 Gross per 24 hour  Intake 240 ml  Output 975 ml  Net -735 ml    General:  Well appearing. No respiratory difficulty HEENT: normal Neck: supple. no JVD.  Cor: PMI nondisplaced. Regular rate & rhythm. No rubs, gallops or murmurs. Well healing sternotomy.  Lungs: course crackles at the R lung base Abdomen: soft, nontender, nondistended. No hepatosplenomegaly. No bruits or masses. Good bowel sounds. Extremities: no cyanosis, clubbing, rash, edema Neuro: alert & oriented x 3, cranial nerves grossly intact. moves all 4 extremities w/o difficulty. Affect pleasant.  ECG: HR 80, NSR, normal axis, normal intervals, nonspecific T wave flattening in precordial leads  Results for orders placed or performed during the hospital encounter of 02/20/18 (from the past 24 hour(s))  Basic metabolic panel     Status: Abnormal   Collection Time: 02/20/18  1:18 PM  Result Value Ref Range   Sodium 137 135 - 145 mmol/L   Potassium 4.5 3.5 - 5.1 mmol/L   Chloride 102 101 - 111 mmol/L   CO2 24 22 - 32 mmol/L   Glucose, Bld 152 (H) 65 - 99 mg/dL   BUN 23 (H) 6 - 20 mg/dL   Creatinine, Ser 1.78 (H) 0.61 - 1.24 mg/dL   Calcium 9.3 8.9 - 10.3 mg/dL   GFR calc non Af Amer 38 (L) >60 mL/min   GFR calc Af Amer 44 (L) >60 mL/min   Anion gap 11 5 - 15  CBC     Status: Abnormal   Collection Time: 02/20/18  1:18 PM  Result Value Ref Range   WBC 8.2 4.0 - 10.5 K/uL   RBC 3.78 (L) 4.22 - 5.81 MIL/uL   Hemoglobin 10.0 (L) 13.0 - 17.0 g/dL   HCT 31.1 (L) 39.0 - 52.0 %   MCV  82.3 78.0 - 100.0  fL   MCH 26.5 26.0 - 34.0 pg   MCHC 32.2 30.0 - 36.0 g/dL   RDW 13.1 11.5 - 15.5 %   Platelets 259 150 - 400 K/uL  D-dimer, quantitative     Status: Abnormal   Collection Time: 02/20/18  1:18 PM  Result Value Ref Range   D-Dimer, Quant 1.70 (H) 0.00 - 0.50 ug/mL-FEU  Brain natriuretic peptide     Status: None   Collection Time: 02/20/18  1:18 PM  Result Value Ref Range   B Natriuretic Peptide 83.0 0.0 - 100.0 pg/mL  I-stat troponin, ED     Status: Abnormal   Collection Time: 02/20/18  1:25 PM  Result Value Ref Range   Troponin i, poc 0.09 (HH) 0.00 - 0.08 ng/mL   Comment NOTIFIED PHYSICIAN    Comment 3          Rapid urine drug screen (hospital performed)     Status: Abnormal   Collection Time: 02/20/18  5:33 PM  Result Value Ref Range   Opiates POSITIVE (A) NONE DETECTED   Cocaine NONE DETECTED NONE DETECTED   Benzodiazepines NONE DETECTED NONE DETECTED   Amphetamines NONE DETECTED NONE DETECTED   Tetrahydrocannabinol NONE DETECTED NONE DETECTED   Barbiturates NONE DETECTED NONE DETECTED  Urinalysis, Routine w reflex microscopic     Status: Abnormal   Collection Time: 02/20/18  5:33 PM  Result Value Ref Range   Color, Urine YELLOW YELLOW   APPearance HAZY (A) CLEAR   Specific Gravity, Urine 1.023 1.005 - 1.030   pH 6.0 5.0 - 8.0   Glucose, UA 50 (A) NEGATIVE mg/dL   Hgb urine dipstick NEGATIVE NEGATIVE   Bilirubin Urine NEGATIVE NEGATIVE   Ketones, ur NEGATIVE NEGATIVE mg/dL   Protein, ur 100 (A) NEGATIVE mg/dL   Nitrite NEGATIVE NEGATIVE   Leukocytes, UA SMALL (A) NEGATIVE   RBC / HPF 6-10 0 - 5 RBC/hpf   WBC, UA >50 (H) 0 - 5 WBC/hpf   Bacteria, UA RARE (A) NONE SEEN   Squamous Epithelial / LPF 0-5 0 - 5   Mucus PRESENT    Hyaline Casts, UA PRESENT   I-stat troponin, ED     Status: Abnormal   Collection Time: 02/20/18  8:09 PM  Result Value Ref Range   Troponin i, poc 0.11 (HH) 0.00 - 0.08 ng/mL   Comment NOTIFIED PHYSICIAN    Comment 3           CBG monitoring, ED     Status: Abnormal   Collection Time: 02/20/18 10:23 PM  Result Value Ref Range   Glucose-Capillary 125 (H) 65 - 99 mg/dL   Dg Chest 2 View  Result Date: 02/20/2018 CLINICAL DATA:  Sudden onset sharp chest pain, short of breath EXAM: CHEST - 2 VIEW COMPARISON:  Chest radiograph 04/01/17 FINDINGS: Sternotomy wires overlie normal cardiac silhouette. There is elevation of the RIGHT hemidiaphragm which is similar to the recent CT and new from chest x-ray of 04/01/2017. RIGHT lower lobe consolidations similar to comparison CT. LEFT lung clear. IMPRESSION: Persistent RIGHT lower lobe consolidation. Recommend follow-up CT with contrast to evaluate after appropriate therapy (3-4 weeks). Persistent elevation of the RIGHT hemidiaphragm new from radiograph 04/01/2017 Electronically Signed   By: Suzy Bouchard M.D.   On: 02/20/2018 14:34   Ct Angio Chest Pe W And/or Wo Contrast  Result Date: 02/20/2018 CLINICAL DATA:  LEFT-sided chest pain radiating to RIGHT-sided chest, starting today. PE suspected, intermediate probability, positive D-dimer. EXAM: CT ANGIOGRAPHY  CHEST WITH CONTRAST TECHNIQUE: Multidetector CT imaging of the chest was performed using the standard protocol during bolus administration of intravenous contrast. Multiplanar CT image reconstructions and MIPs were obtained to evaluate the vascular anatomy. CONTRAST:  4m ISOVUE-370 IOPAMIDOL (ISOVUE-370) INJECTION 76% COMPARISON:  Chest CT angiogram dated 02/13/2018. Chest CT angiogram dated 04/02/2017. FINDINGS: Cardiovascular: Evaluation of the peripheral segmental and subsegmental pulmonary artery branches to the LEFT lower lobe is limited due to patient breathing motion artifact, however, there is no pulmonary embolism identified within the main, lobar or central segmental pulmonary arteries to the LEFT lung. There is no pulmonary embolism identified within the main, lobar or segmental pulmonary arteries to the RIGHT lung.  Heart size is within normal limits. No pericardial effusion. No thoracic aortic aneurysm. Scattered mild aortic atherosclerosis. Status post median sternotomy for presumed CABG. Mediastinum/Nodes: No mass or enlarged lymph nodes seen within the mediastinum or perihilar regions. Esophagus appears normal. Trachea and central bronchi are unremarkable. Lungs/Pleura: Persistent dense consolidation within the RIGHT lower lobe, posterior aspect, not appreciably changed compared to the most recent chest CT angiogram of 02/13/2018, new compared to the earlier chest CT of 04/02/2017. No new lung abnormality.  No pleural effusion or pneumothorax. Upper Abdomen: Limited images of the upper abdomen are unremarkable. Musculoskeletal: Mild degenerative spondylosis throughout the mid and lower thoracic spine. No acute or suspicious osseous finding. Review of the MIP images confirms the above findings. IMPRESSION: 1. No pulmonary embolism seen, with mild study limitations detailed above. 2. Persistent dense consolidation within the RIGHT lower lobe, stable, most likely pneumonia. Consider follow-up chest CT in 4-6 weeks to ensure resolution. Aortic Atherosclerosis (ICD10-I70.0). Electronically Signed   By: SFranki CabotM.D.   On: 02/20/2018 18:43   [place]rot[place]  Result Date: 02/20/2018 CLINICAL DATA:  67year old male with acute LEFT testicular pain for 3 days. EXAM: SCROTAL ULTRASOUND DOPPLER ULTRASOUND OF THE TESTICLES TECHNIQUE: Complete ultrasound examination of the testicles, epididymis, and other scrotal structures was performed. Color and spectral Doppler ultrasound were also utilized to evaluate blood flow to the testicles. COMPARISON:  None. FINDINGS: Right testicle Not identified within the scrotal sac or RIGHT inguinal region. Left testicle Measurements: 3.2 x 2.1 x 2.4 cm with increased vascularity. No mass or microlithiasis visualized. Right epididymis:  Not visualized Left epididymis:  Enlarged and hypervascular.  Hydrocele:  Small LEFT hydrocele noted. Varicocele:  Mild LEFT varicocele noted Pulsed Doppler interrogation of the LEFT testicle demonstrates normal low resistance arterial and venous waveforms. IMPRESSION: 1. LEFT epididymo-orchitis with small LEFT hydrocele. No abscess or testicular torsion. 2. RIGHT testicle not identified within the scrotal sac or RIGHT inguinal region. Correlate with history. Electronically Signed   By: JMargarette CanadaM.D.   On: 02/20/2018 19:10   [place]rot[place] Doppler  Result Date: 02/20/2018 CLINICAL DATA:  67year old male with acute LEFT testicular pain for 3 days. EXAM: SCROTAL ULTRASOUND DOPPLER ULTRASOUND OF THE TESTICLES TECHNIQUE: Complete ultrasound examination of the testicles, epididymis, and other scrotal structures was performed. Color and spectral Doppler ultrasound were also utilized to evaluate blood flow to the testicles. COMPARISON:  None. FINDINGS: Right testicle Not identified within the scrotal sac or RIGHT inguinal region. Left testicle Measurements: 3.2 x 2.1 x 2.4 cm with increased vascularity. No mass or microlithiasis visualized. Right epididymis:  Not visualized Left epididymis:  Enlarged and hypervascular. Hydrocele:  Small LEFT hydrocele noted. Varicocele:  Mild LEFT varicocele noted Pulsed Doppler interrogation of the LEFT testicle demonstrates normal low resistance arterial and venous waveforms.  IMPRESSION: 1. LEFT epididymo-orchitis with small LEFT hydrocele. No abscess or testicular torsion. 2. RIGHT testicle not identified within the scrotal sac or RIGHT inguinal region. Correlate with history. Electronically Signed   By: Margarette Canada M.D.   On: 02/20/2018 19:10    ASSESSMENT: Patient with multiple CV risk factors and 3vCABG 3 weeks ago presents with chest pain, found to have elevated troponin. Of note, patient's troponin was elevated on many checks throughout last hospitalization approximately 1 week ago for pneumonia, at which time he similarly complained  of chest pain. The etiology for his persistently positive troponin is likely a combination of microvascular disease, renal insufficiency, and demand mismatch in the setting of pneumonia. Additionally, there are some aspects of his symptoms that could be consistent with a post-pericardiotomy syndrome, which can cause elevated troponin as well through an inflammatory mechanism. The patient's story and temporal trend in troponin elevation does not seem consistent with a type 1 acute coronary syndrome, although he is certainly at risk for this given his disease burden.   PLAN/DISCUSSION: - recommend trying colchicine 0.71m daily for treatment of post-pericardiotomy syndrome; note that colchicine should be dose reduced (or discontinued) if renal function worsens to eGFR <30  - cont aspirin 813mdaily - check 1 more troponin @ 6AM - obtain ECG if any change in character/severity of symptoms - order resting echocardiogram  AnMarcie MowersMD Cardiology Fellow, PGY-5

## 2018-02-20 NOTE — ED Notes (Signed)
PAGED INT MED

## 2018-02-20 NOTE — Progress Notes (Signed)
Called ED RN to get a report. RN stated that pt having active chest pain and she just gave Morphine IV. We can't have a pt having active chest as per our unit protocol. ED RN will call back and re assess pt. Charge Nurse is aware.

## 2018-02-20 NOTE — H&P (Signed)
History and Physical    Gabriela Giannelli CBS:[id number] DOB: [birth year] DOA: 02/20/2018  Referring MD/NP/PA:   PCP: [name], PA-C   Patient coming from:  The patient is coming from home.  At baseline, pt is independent for most of ADL.       Chief Complaint: chest pain and scrotum pain  HPI: Michael [name] is a 67 y.o. male with medical history significant of HTN, HLD, DM, HIV (CD4=630 and VL<20 on 10/14/17), CAD, status post three-vessel CABG on April 25  at [facility], PE, A. fib not on anticoagulants, GERD, gout, depression, cocaine abuse, CKD-3, recent admission for HCAP still on Augmentin, who presents with chest pain and scrotum pain.  Patient states that his chest pain started at noon today.  It is located in the left side of the chest, across to the right side, sharp, 10 out of 10 severity, radiating to the right shoulder and right neck, pleuritic, aggravated by coughing and deep breath.  No tenderness in the calf areas.  It is associated with shortness of breath.  He has cough with thick mucus production.  No fever or chills.  Patient states that he is still taking Augmentin for pneumonia. He states that he had right orchectomy due to injury, now he has pain left scrotum. No symptoms of UTI.  Patient has nausea, no vomiting, diarrhea or abdominal pain.  No unilateral weakness.  ED Course: pt was found to have troponin 0.09, 0.11 (patient had troponin 0.19 on 02/16/2018), BNP 83, urinalysis with small amount of leukocyte, UDS positive for opiates, stable renal function, temperature normal, tachycardia, tachypnea, oxygen saturation 100% on room air, chest x-ray showed right lower lobe infiltration.  CT angiogram is negative for PE, again showed right lower lobe infiltration.  Patient is placed on telemetry bed for observation.  Cardiology, Dr. Neena Rhymes was consulted.  Review of Systems:   General: no fevers, chills, no body weight gain, has fatigue HEENT: no blurry  vision, hearing changes or sore throat Respiratory: has dyspnea, coughing, no wheezing CV: has chest pain, no palpitations GI: no nausea, vomiting, abdominal pain, diarrhea, constipation GU: no dysuria, burning on urination, increased urinary frequency, hematuria. Has left scrotum pain. Ext: no leg edema Neuro: no unilateral weakness, numbness, or tingling, no vision change or hearing loss Skin: no rash, no skin tear. MSK: No muscle spasm, no deformity, no limitation of range of movement in spin Heme: No easy bruising.  Travel history: No recent long distant travel.  Allergy: No Known Allergies  Past Medical History:  Diagnosis Date  . A-fib ([place]za-[place] II)   . Asthma    No PFTs, history of childhood asthma  . CAD (coronary artery disease)    a. 06/2013 STEMI/PCI ([facility]): LAD w/ thrombus (treated with BMS), mid 75%, D2 75%; LCX OM2 75%; RCA small, PDA 95%, PLV 95%;  b. 10/2013 Cath/PCI: ISR w/in LAD (Promus DES x 2), borderline OM2 lesion;  c. 01/2014 MV: Intermediate risk, medium-sized distal ant wall infarct w/ very small amt of peri-infarct ischemia. EF 60%.  . Cellulitis 04/2014   left facial  . Chondromalacia of medial femoral condyle    Left knee MRI 04/28/12: Chondromalacia of the medial femoral condyle with slight peripheral degeneration of the meniscocapsular junction of the medial meniscus; followed by sports medicine  . Collagen vascular disease ([place])   . Crack cocaine use    for 20+ years, has been enrolled in detox programs in the past  . Depression  with history of hospitalization for suicidal ideation  . Diabetes mellitus 2002   Diagnosed in 2002, started insulin in 2012  . Gout   . Gout 04/28/2012  . Headache(784.0)    CT head 08/2011: Periventricular and subcortical white matter hypodensities are most in keeping with chronic microangiopathic change  . HIV infection [facility]) Nov 2012   Followed by [name]  . Hyperlipidemia   . Hypertension   . Pulmonary embolism [facility])      Past Surgical History:  Procedure Laterality Date  . BACK SURGERY     1988  . BOWEL RESECTION    . CARDIAC SURGERY    . CERVICAL SPINE SURGERY     " rods in my neck "  . CORONARY STENT PLACEMENT    . NM MYOCAR PERF WALL MOTION  12/27/2011   normal  . SPINE SURGERY      Social History:  reports that he has never smoked. He has never used smokeless tobacco. He reports that he has current or past drug history. Drugs: "Crack" cocaine and Cocaine. He reports that he does not drink alcohol.  Family History:  Family History  Problem Relation Age of Onset  . Diabetes Mother   . Hypertension Mother   . Hyperlipidemia Mother   . Diabetes Father   . Cancer Father   . Hypertension Father   . Diabetes Brother   . Heart disease Brother   . Diabetes Sister   . Colon cancer Neg Hx      Prior to Admission medications   Medication Sig Start Date End Date Taking? Authorizing Provider  albuterol (PROAIR HFA) 108 (90 Base) MCG/ACT inhaler INHALE TWO PUFFS INTO THE LUNGS EVERY 6 (SIX) HOURS AS NEEDED FOR WHEEZING OR SHORTNESS OF BREATH. 11/24/17  Yes Lindell Spar I, NP  allopurinol (ZYLOPRIM) 300 MG tablet Take 1 tablet (300 mg total) by mouth daily. For gout. 11/25/17  Yes Lindell Spar I, NP  amantadine (SYMMETREL) 100 MG capsule Take 100 mg by mouth daily. 02/14/18  Yes [provider]  amoxicillin-clavulanate (AUGMENTIN) 875-125 MG tablet Take 1 tablet by mouth every 12 (twelve) hours for 4 days. 02/18/18 02/22/18 Yes Shelly Coss, MD  aspirin 81 MG chewable tablet Chew 81 mg by mouth daily.   Yes [provider]  diltiazem (CARDIZEM CD) 120 MG 24 hr capsule Take 1 capsule (120 mg total) by mouth daily. For high blood pressure 11/25/17  Yes [place]ell Spar I, NP  emtricitabine-rilpivir-tenofovir AF (ODEFSEY) 200-25-25 MG TABS tablet Take 1 tablet by mouth daily with breakfast. For HIV infection 11/24/17  Yes Nwoko, Herbert Pun I, NP  famotidine (PEPCID) 20 MG tablet Take 1 tablet (20  mg total) by mouth daily. For acid reflux 11/24/17  Yes [place]ell Spar I, NP  gabapentin (NEURONTIN) 400 MG capsule Take 1 capsule (400 mg total) by mouth 2 (two) times daily. For agitation/neuropathic pain Patient taking differently: Take 400 mg by mouth 2 (two) times daily as needed (For agitation/neuropathic pain).  11/24/17  Yes Lindell Spar I, NP  hydrochlorothiazide (MICROZIDE) 12.5 MG capsule Take 1 capsule (12.5 mg total) by mouth daily. For high blood pressure 11/25/17  Yes Nwoko, Herbert Pun I, NP  hydrOXYzine (ATARAX/VISTARIL) 25 MG tablet Take 1 tablet (25 mg total) by mouth 3 (three) times daily as needed for anxiety. 11/24/17  Yes Lindell Spar I, NP  insulin glargine (LANTUS) 100 UNIT/ML injection Inject 0.3 mLs (30 Units total) into the skin at bedtime. For diabetes management 11/24/17  Yes  [place] Spar I, NP  insulin lispro (HUMALOG) 100 UNIT/ML injection Inject 0.05 mLs (5 Units total) into the skin 3 (three) times daily with meals. For diabetes management Patient taking differently: Inject 4 Units into the skin 3 (three) times daily with meals. For diabetes management 11/24/17  Yes Lindell Spar I, NP  isosorbide mononitrate (IMDUR) 30 MG 24 hr tablet Take 1 tablet (30 mg total) by mouth daily. For chest 11/25/17  Yes Lindell Spar I, NP  losartan (COZAAR) 50 MG tablet Take 1 tablet (50 mg total) by mouth daily. For high blood pressure 11/25/17  Yes Nwoko, Herbert Pun I, NP  metFORMIN (GLUCOPHAGE) 1000 MG tablet Take 1 tablet (1,000 mg total) by mouth 2 (two) times daily with a meal. For diabetes management 11/24/17  Yes Lindell Spar I, NP  pravastatin (PRAVACHOL) 40 MG tablet Take 1 tablet (40 mg total) by mouth at bedtime. For high cholesterol 11/24/17  Yes Lindell Spar I, NP  sertraline (ZOLOFT) 50 MG tablet Take 1 tablet (50 mg total) by mouth daily. For depression 11/25/17  Yes Lindell Spar I, NP  traZODone (DESYREL) 50 MG tablet Take 1 tablet (50 mg total) by mouth at bedtime as needed for sleep. 11/24/17   Yes Nwoko, Herbert Pun I, NP  ACCU-CHEK SOFTCLIX LANCETS lancets USE TO CHECK BLOOD GLUCOSE LEVELS UP TO 4 TIMES DAILY AS DIRECTED 11/24/17   [place] Spar I, NP  Insulin Syringe-Needle U-100 (B-D INS SYR MICROFINE 1CC/27G) 27G X 5/8" 1 ML MISC 1 strip by Does not apply route 3 (three) times daily as needed. For blood sugar checks 11/24/17   [place] Spar I, NP    Physical Exam: Vitals:   02/20/18 2100 02/20/18 2115 02/20/18 2130 02/20/18 2145  BP: 127/89 135/86 (!) 132/97 (!) 135/102  Pulse: 100 99 99 100  Resp: 15 15 16 17   Temp:      TempSrc:      SpO2: 99% 100% 100% 100%   General: Not in acute distress HEENT:       Eyes: PERRL, EOMI, no scleral icterus.       ENT: No discharge from the ears and nose, no pharynx injection, no tonsillar enlargement.        Neck: No JVD, no bruit, no mass felt. Heme: No neck lymph node enlargement. Cardiac: S1/S2, RRR, No murmurs, No gallops or rubs. Respiratory: No rales, wheezing, rhonchi or rubs. Chest wall: surgical site from CBAG is healing well, no draining. GI: Soft, nondistended, nontender, no rebound pain, no organomegaly, BS present. GU: has tenderness in left side scrotum. Ext: No pitting leg edema bilaterally. 2+DP/PT pulse bilaterally. Musculoskeletal: No joint deformities, No joint redness or warmth, no limitation of ROM in spin. Skin: No rashes.  Neuro: Alert, oriented X3, cranial nerves II-XII grossly intact, moves all extremities normally.  Psych: Patient is not psychotic, no suicidal or hemocidal ideation.  Labs on Admission: I have personally reviewed following labs and imaging studies  CBC: Recent Labs  Lab 02/14/18 0530 02/20/18 1318  WBC 6.7 8.2  NEUTROABS 4.4  --   HGB 10.9* 10.0*  HCT 34.0* 31.1*  MCV 81.5 82.3  PLT 323 299   Basic Metabolic Panel: Recent Labs  Lab 02/14/18 0530 02/17/18 0301 02/20/18 1318  NA 135  --  137  K 4.6  --  4.5  CL 101  --  102  CO2 25  --  24  GLUCOSE 192*  --  152*  BUN 20  --  23*  CREATININE 1.80* 1.60* 1.78*  CALCIUM 8.8*  --  9.3   GFR: Estimated Creatinine Clearance: 39 mL/min (A) (by C-G formula based on SCr of 1.78 mg/dL (H)). Liver Function Tests: No results for input(s): AST, ALT, ALKPHOS, BILITOT, PROT, ALBUMIN in the last 168 hours. No results for input(s): LIPASE, AMYLASE in the last 168 hours. No results for input(s): AMMONIA in the last 168 hours. Coagulation Profile: No results for input(s): INR, PROTIME in the last 168 hours. Cardiac Enzymes: Recent Labs  Lab 02/14/18 0530 02/14/18 1744 02/15/18 0728 02/16/18 0805  TROPONINI 0.21* 0.20* 0.20* 0.19*   BNP (last 3 results) No results for input(s): PROBNP in the last 8760 hours. HbA1C: No results for input(s): HGBA1C in the last 72 hours. CBG: Recent Labs  Lab 02/16/18 1209 02/16/18 1643 02/16/18 2116 02/17/18 0722 02/17/18 1152  GLUCAP 263* 330* 225* 190* 299*   Lipid Profile: No results for input(s): CHOL, HDL, LDLCALC, TRIG, CHOLHDL, LDLDIRECT in the last 72 hours. Thyroid Function Tests: No results for input(s): TSH, T4TOTAL, FREET4, T3FREE, THYROIDAB in the last 72 hours. Anemia Panel: No results for input(s): VITAMINB12, FOLATE, FERRITIN, TIBC, IRON, RETICCTPCT in the last 72 hours. Urine analysis:    Component Value Date/Time   COLORURINE YELLOW 02/20/2018 1733   APPEARANCEUR HAZY (A) 02/20/2018 1733   LABSPEC 1.023 02/20/2018 1733   PHURINE 6.0 02/20/2018 1733   GLUCOSEU 50 (A) 02/20/2018 1733   HGBUR NEGATIVE 02/20/2018 1733   BILIRUBINUR NEGATIVE 02/20/2018 1733   KETONESUR NEGATIVE 02/20/2018 1733   PROTEINUR 100 (A) 02/20/2018 1733   UROBILINOGEN 0.2 08/13/2015 1030   NITRITE NEGATIVE 02/20/2018 1733   LEUKOCYTESUR SMALL (A) 02/20/2018 1733   Sepsis Labs: @LABRCNTIP (procalcitonin:4,lacticidven:4) ) Recent Results (from the past 240 hour(s))  Culture, blood (routine x 2)     Status: None   Collection Time: 02/14/18 12:15 AM  Result Value Ref Range Status    Specimen Description BLOOD LEFT ANTECUBITAL  Final   Special Requests   Final    BOTTLES DRAWN AEROBIC AND ANAEROBIC Blood Culture adequate volume   Culture   Final    NO GROWTH 5 DAYS Performed at [facility], [place] [address], [place], [place] [address]    Report Status 02/19/2018 FINAL  Final  Culture, blood (routine x 2)     Status: None   Collection Time: 02/14/18 12:18 AM  Result Value Ref Range Status   Specimen Description BLOOD RIGHT ANTECUBITAL  Final   Special Requests   Final    BOTTLES DRAWN AEROBIC AND ANAEROBIC Blood Culture adequate volume   Culture   Final    NO GROWTH 5 DAYS Performed at [facility], [place] [address] [place], [place] [address]    Report Status 02/19/2018 FINAL  Final     Radiological Exams on Admission: Dg Chest 2 View  Result Date: 02/20/2018 CLINICAL DATA:  Sudden onset sharp chest pain, short of breath EXAM: CHEST - 2 VIEW COMPARISON:  Chest radiograph 04/01/17 FINDINGS: Sternotomy wires overlie normal cardiac silhouette. There is elevation of the RIGHT hemidiaphragm which is similar to the recent CT and new from chest x-ray of 04/01/2017. RIGHT lower lobe consolidations similar to comparison CT. LEFT lung clear. IMPRESSION: Persistent RIGHT lower lobe consolidation. Recommend follow-up CT with contrast to evaluate after appropriate therapy (3-4 weeks). Persistent elevation of the RIGHT hemidiaphragm new from radiograph 04/01/2017 Electronically Signed   By: Suzy Bouchard M.D.   On: 02/20/2018 14:34   Ct Angio  Chest Pe W And/or Wo Contrast  Result Date: 02/20/2018 CLINICAL DATA:  LEFT-sided chest pain radiating to RIGHT-sided chest, starting today. PE suspected, intermediate probability, positive D-dimer. EXAM: CT ANGIOGRAPHY CHEST WITH CONTRAST TECHNIQUE: Multidetector CT imaging of the chest was performed using the standard protocol during bolus administration of intravenous contrast. Multiplanar CT image reconstructions and MIPs were  obtained to evaluate the vascular anatomy. CONTRAST:  86mL ISOVUE-370 IOPAMIDOL (ISOVUE-370) INJECTION 76% COMPARISON:  Chest CT angiogram dated 02/13/2018. Chest CT angiogram dated 04/02/2017. FINDINGS: Cardiovascular: Evaluation of the peripheral segmental and subsegmental pulmonary artery branches to the LEFT lower lobe is limited due to patient breathing motion artifact, however, there is no pulmonary embolism identified within the main, lobar or central segmental pulmonary arteries to the LEFT lung. There is no pulmonary embolism identified within the main, lobar or segmental pulmonary arteries to the RIGHT lung. Heart size is within normal limits. No pericardial effusion. No thoracic aortic aneurysm. Scattered mild aortic atherosclerosis. Status post median sternotomy for presumed CABG. Mediastinum/Nodes: No mass or enlarged lymph nodes seen within the mediastinum or perihilar regions. Esophagus appears normal. Trachea and central bronchi are unremarkable. Lungs/Pleura: Persistent dense consolidation within the RIGHT lower lobe, posterior aspect, not appreciably changed compared to the most recent chest CT angiogram of 02/13/2018, new compared to the earlier chest CT of 04/02/2017. No new lung abnormality.  No pleural effusion or pneumothorax. Upper Abdomen: Limited images of the upper abdomen are unremarkable. Musculoskeletal: Mild degenerative spondylosis throughout the mid and lower thoracic spine. No acute or suspicious osseous finding. Review of the MIP images confirms the above findings. IMPRESSION: 1. No pulmonary embolism seen, with mild study limitations detailed above. 2. Persistent dense consolidation within the RIGHT lower lobe, stable, most likely pneumonia. Consider follow-up chest CT in 4-6 weeks to ensure resolution. Aortic Atherosclerosis (ICD10-I70.0). Electronically Signed   By: Franki Cabot M.D.   On: 02/20/2018 18:43   US Scrotum  Result Date: 02/20/2018 CLINICAL DATA:  67 year old  male with acute LEFT testicular pain for 3 days. EXAM: SCROTAL ULTRASOUND DOPPLER ULTRASOUND OF THE TESTICLES TECHNIQUE: Complete ultrasound examination of the testicles, epididymis, and other scrotal structures was performed. Color and spectral Doppler ultrasound were also utilized to evaluate blood flow to the testicles. COMPARISON:  None. FINDINGS: Right testicle Not identified within the scrotal sac or RIGHT inguinal region. Left testicle Measurements: 3.2 x 2.1 x 2.4 cm with increased vascularity. No mass or microlithiasis visualized. Right epididymis:  Not visualized Left epididymis:  Enlarged and hypervascular. Hydrocele:  Small LEFT hydrocele noted. Varicocele:  Mild LEFT varicocele noted Pulsed Doppler interrogation of the LEFT testicle demonstrates normal low resistance arterial and venous waveforms. IMPRESSION: 1. LEFT epididymo-orchitis with small LEFT hydrocele. No abscess or testicular torsion. 2. RIGHT testicle not identified within the scrotal sac or RIGHT inguinal region. Correlate with history. Electronically Signed   By: Margarette Canada M.D.   On: 02/20/2018 19:10   US Scrotum Doppler  Result Date: 02/20/2018 CLINICAL DATA:  67 year old male with acute LEFT testicular pain for 3 days. EXAM: SCROTAL ULTRASOUND DOPPLER ULTRASOUND OF THE TESTICLES TECHNIQUE: Complete ultrasound examination of the testicles, epididymis, and other scrotal structures was performed. Color and spectral Doppler ultrasound were also utilized to evaluate blood flow to the testicles. COMPARISON:  None. FINDINGS: Right testicle Not identified within the scrotal sac or RIGHT inguinal region. Left testicle Measurements: 3.2 x 2.1 x 2.4 cm with increased vascularity. No mass or microlithiasis visualized. Right epididymis:  Not visualized Left epididymis:  Enlarged and hypervascular. Hydrocele:  Small LEFT hydrocele noted. Varicocele:  Mild LEFT varicocele noted Pulsed Doppler interrogation of the LEFT testicle demonstrates normal  low resistance arterial and venous waveforms. IMPRESSION: 1. LEFT epididymo-orchitis with small LEFT hydrocele. No abscess or testicular torsion. 2. RIGHT testicle not identified within the scrotal sac or RIGHT inguinal region. Correlate with history. Electronically Signed   By: Margarette Canada M.D.   On: 02/20/2018 19:10     EKG: Independently reviewed.  Sinus rhythm, QTC 455, nonspecific T wave change.  Assessment/Plan Principal Problem:   Chest pain Active Problems:   HIV (human immunodeficiency virus infection) ([place])   Uncontrolled type 2 diabetes with neuropathy ([name])   Hyperlipidemia with target LDL less than 100   Asthma, chronic   Cocaine use disorder, severe, dependence ([name])   CKD (chronic kidney disease) stage 3, GFR 30-59 ml/min ([name])   Gout   HCAP (healthcare-associated pneumonia)   Depression   Epididymo-orchitis, acute   Essential hypertension   Chest pain, elevated trop and hx of CAD: s/p of CABG on 01/2018. Trop 0.11.  Cardiology, Dr. Neena Rhymes was consulted-->likely "combination of microvascular disease, renal insufficiency, and demand mismatch in the setting of pneumonia. Additionally, there are some aspects of his symptoms that could be consistent with a post-pericardiotomy syndrome, which can cause elevated troponin as well through an inflammatory mechanism". He recommended to start colchicine for possible  post-pericardiotomy syndrome.  - will place on Tele bed for obs - Highly appreciate  Dr. Lockie Mola recommendation - cycle CE q6 x3 and repeat EKG in the am  - prn Nitroglycerin, Morphine, and aspirin, pravastatin, imdur -colchicine 0.6mg  daily  HIV (human immunodeficiency virus infection) ([place]): CD4=630 and VL<20 on 10/14/17.   -Continue home HIV medications  Uncontrolled type 2 diabetes with neuropathy ([place]): Last A1c 10.6 on 12/26/2016, poorly controled. Patient is taking Lantus and Humalog, metformin at home -will decrease Lantus dose from 30 to 20 units  daily -SSI  Hyperlipidemia with target LDL less than 100: -Pravastatin  Asthma, chronic:  Stable -PRN albuterol nebulizer  Cocaine use disorder, severe, dependence ([place]): UDS positive for opiates, but negative for cocaine today -did counseling  about importance of quitting substance abuse  CKD (chronic kidney disease) stage 3, GFR 30-59 ml/min ([place]): stable.  Baseline creatinine: 1.5-1.8.  His creatinine is 1.78, BUN 23 -Follow-up renal function by BMP  Gout:  -continue home allopurinol  Recent HCAP (healthcare-associated pneumonia): No fever or leukocytosis. -Continue Augmentin  Depression: Stable, no suicidal or homicidal ideations. -Continue home medications: Zoloft  HTN:  -Continue home medications: Cardizem, HCTZ, Cozaar -IV hydralazine prn  Epididymo-orchitis, acute:  -[place] patient wasin (given 1 dose of Bactrim in ED, will not continue due to CKD 3) -Follow-up blood culture and urine culture - Check GC chlamydia prob  Atrial Fibrillation: CHA2DS2-VASc Score is 4, needs oral anticoagulation, but patient is not AC at home, not sure why patient is not taking anticoagulants. Heart rate is 110s. -Continue Cardizem   DVT ppx: SQ Lovenox Code Status: Full code Family Communication: None at bed side.  Disposition Plan:  Anticipate discharge back to previous home environment Consults called: Cardiology, Dr. Neena Rhymes Admission status: Obs / tele    Date of Service 02/20/2018    [name] Costa [facility] Pager [phone]  If 7PM-7AM, please contact night-coverage [email or web address] Password [email or web address] 02/20/2018, 10:07 PM

## 2018-02-20 NOTE — ED Provider Notes (Signed)
Patient placed in Quick Look pathway, seen and evaluated   Chief Complaint: Chest Pain  HPI: 67 year old male with extensive past medical history including HTN, HLD, DM, HIV , CAD status post three-vessel CABG performed on April 25 and [place] as well as history of PE and atrial fibrillation not currently on any anticoagulation medicine who presents emergency department today for chest pain.  Patient states that he was resting in his bed yesterday when he started developing substernal chest pain with radiation into his right shoulder and right neck with associated shortness of breath as well as nausea.  He states that his symptoms are worse when walking around and when taking a deep breath.  Pain is been constant since onset.  Patient received 2 sublingual nitro as well as 324 mg of aspirin by EMS without any relief.  Patient states he is unsure if this feels similar to his prior MIs or PEs.  He denies any associated fever, focal weakness, chills, cough, hemoptysis, lower leg swelling.   ROS:  Positive ROS: (+) Chest pain, shortness of breath, nausea Negative ROS: (-) Diaphoresis, emesis, hemoptysis, cough, lower leg swelling  Physical Exam:   Gen: No distress  Neuro: Awake and Alert  Skin: Warm   Focused Exam: Substernal chest wound from recent CABG that does not appear to have any surrounding signs of infection.  Heart tachycardic, regular rhythm, no obvious murmur; Lungs equal bilaterally; Abd soft, NT, no rebound or guarding; Ext 2+ pedal pulses bilaterally, no edema.  BP 111/74   Pulse (!) 110   Temp 98.5 F (36.9 C) (Oral)   Resp 16   SpO2 99%   Plan: Based on initial evaluation, labs ARE indicated and radiology studies ARE indicated.  Patient counseled on process, plan, and necessity for staying for completing the evaluation."  Initiation of care has begun. The patient has been counseled on the process, plan, and necessity for staying for the completion/evaluation, and the  remainder of the medical screening examination    Lorelle Gibbs 02/20/18 2051    Fatima Blank, MD 02/20/18 2240

## 2018-02-20 NOTE — ED Triage Notes (Signed)
Patient arrives by [facility] with sudden onset of sharp chest pain that started at 1215 today, CABG less than a month ago. Received 2x SL nitro, no relief. 324mg  aspirin from [facility]. 20g saline lock in left AC.

## 2018-02-20 NOTE — ED Triage Notes (Signed)
Patient complains of 6/10 sharp chest pain across the center of his chest that started at 1200 today. Also complains of shortness of breath and nausea.

## 2018-02-20 NOTE — ED Provider Notes (Signed)
[facility] EMERGENCY DEPARTMENT Provider Note   CSN: [id number] Arrival date & time: 02/20/18  1312     History   Chief Complaint Chief Complaint  Patient presents with  . Chest Pain    HPI Michael [name] is a 67 y.o. male.  HPI  Patient is a 67 year old male with a recent medical history of CABG in April.  Patient had normal postoperative course until this past week when he was admitted for pneumonia of the right lower lobe.  Patient was given IV antibiotics and discharged back to his care facility on p.o. Augmentin.  Patient comes in today company of chest pain.  Patient states his pain is located in the left chest wall made worse by deep breathing and sharp in nature.  Patient denies any new fevers or chills.  Patient denies diaphoresis, nausea or vomiting.  Patient denies any leg swelling.  Patient endorses mild shortness of breath.  Patient denies any alleviating factors but states his pain is made worse by deep breathing.  Patient also endorses scrotal pain.  Patient has a history of single orchiectomy previously but states that his remaining testicle has become tender over the past 2 days.  Patient denies any swelling, dysuria.  Past Medical History:  Diagnosis Date  . A-fib ([place])   . Asthma    No PFTs, history of childhood asthma  . CAD (coronary artery disease)    a. 06/2013 STEMI/PCI ([facility]): LAD w/ thrombus (treated with BMS), mid 75%, D2 75%; LCX OM2 75%; RCA small, PDA 95%, PLV 95%;  b. 10/2013 Cath/PCI: ISR w/in LAD (Promus DES x 2), borderline OM2 lesion;  c. 01/2014 MV: Intermediate risk, medium-sized distal ant wall infarct w/ very small amt of peri-infarct ischemia. EF 60%.  . Cellulitis 04/2014   left facial  . Chondromalacia of medial femoral condyle    Left knee MRI 04/28/12: Chondromalacia of the medial femoral condyle with slight peripheral degeneration of the meniscocapsular junction of the medial meniscus; followed by sports medicine  .  Collagen vascular disease ([place])   . Crack cocaine use    for 20+ years, has been enrolled in detox programs in the past  . Depression    with history of hospitalization for suicidal ideation  . Diabetes mellitus 2002   Diagnosed in 2002, started insulin in 2012  . Gout   . Gout 04/28/2012  . Headache(784.0)    CT head 08/2011: Periventricular and subcortical white matter hypodensities are most in keeping with chronic microangiopathic change  . HIV infection [facility]) Nov 2012   Followed by [name]  . Hyperlipidemia   . Hypertension   . Pulmonary embolism [facility])     Patient Active Problem List   Diagnosis Date Noted  . Depression 02/20/2018  . Epididymo-orchitis, acute 02/20/2018  . Essential hypertension 02/20/2018  . Chest pain 02/20/2018  . AKI (acute kidney injury) ([place]) 02/14/2018  . HCAP (healthcare-associated pneumonia) 02/14/2018  . Hepatitis C 12/30/2016  . Back pain 04/18/2016  . S/P carotid endarterectomy 11/15/2015  . MDD (major depressive disorder), recurrent severe, without psychosis ([place]) 09/09/2015  . Cocaine-induced mood disorder ([place]) 08/14/2015  . Cocaine abuse with cocaine-induced mood disorder ([place]) 08/14/2015  . Gout 07/10/2015  . CKD (chronic kidney disease) stage 3, GFR 30-59 ml/min ([name]) 03/06/2015  . Normocytic anemia 03/06/2015  . Hypoglycemia   . Encounter for general adult medical examination with abnormal findings 02/09/2015  . Cocaine use disorder, severe, dependence ([place]) 12/13/2014  . Substance  or medication-induced depressive disorder with onset during withdrawal ([name]) 12/13/2014  . Severe recurrent major depressive disorder with psychotic features ([place]) 12/12/2014  . Cervicalgia 06/28/2014  . Lumbar radiculopathy, chronic 06/28/2014  . Asthma, chronic 02/03/2014  . 3-vessel CAD 06/24/2013  . ED (erectile dysfunction) of organic origin 07/07/2012  . Hypertension goal BP (blood pressure) < 140/80 04/29/2012  . Chondromalacia of left knee  03/19/2012  . Hyperlipidemia with target LDL less than 100 02/12/2012  . Fibromyalgia 02/12/2012  . HIV (human immunodeficiency virus infection) ([place]) 08/27/2011  . Uncontrolled type 2 diabetes with neuropathy ([place]) 10/17/2000    Past Surgical History:  Procedure Laterality Date  . BACK SURGERY     1988  . BOWEL RESECTION    . CARDIAC SURGERY    . CERVICAL SPINE SURGERY     " rods in my neck "  . CORONARY STENT PLACEMENT    . NM MYOCAR PERF WALL MOTION  12/27/2011   normal  . SPINE SURGERY          Home Medications    Prior to Admission medications   Medication Sig Start Date End Date Taking? Authorizing Provider  albuterol (PROAIR HFA) 108 (90 Base) MCG/ACT inhaler INHALE TWO PUFFS INTO THE LUNGS EVERY 6 (SIX) HOURS AS NEEDED FOR WHEEZING OR SHORTNESS OF BREATH. 11/24/17  Yes Lindell Spar I, NP  allopurinol (ZYLOPRIM) 300 MG tablet Take 1 tablet (300 mg total) by mouth daily. For gout. 11/25/17  Yes Lindell Spar I, NP  amantadine (SYMMETREL) 100 MG capsule Take 100 mg by mouth daily. 02/14/18  Yes [provider]  amoxicillin-clavulanate (AUGMENTIN) 875-125 MG tablet Take 1 tablet by mouth every 12 (twelve) hours for 4 days. 02/18/18 02/22/18 Yes Shelly Coss, MD  aspirin 81 MG chewable tablet Chew 81 mg by mouth daily.   Yes [provider]  diltiazem (CARDIZEM CD) 120 MG 24 hr capsule Take 1 capsule (120 mg total) by mouth daily. For high blood pressure 11/25/17  Yes Lindell Spar I, NP  emtricitabine-rilpivir-tenofovir AF (ODEFSEY) 200-25-25 MG TABS tablet Take 1 tablet by mouth daily with breakfast. For HIV infection 11/24/17  Yes Nwoko, Herbert Pun I, NP  famotidine (PEPCID) 20 MG tablet Take 1 tablet (20 mg total) by mouth daily. For acid reflux 11/24/17  Yes Lindell Spar I, NP  gabapentin (NEURONTIN) 400 MG capsule Take 1 capsule (400 mg total) by mouth 2 (two) times daily. For agitation/neuropathic pain Patient taking differently: Take 400 mg by mouth 2 (two) times  daily as needed (For agitation/neuropathic pain).  11/24/17  Yes Lindell Spar I, NP  hydrochlorothiazide (MICROZIDE) 12.5 MG capsule Take 1 capsule (12.5 mg total) by mouth daily. For high blood pressure 11/25/17  Yes Nwoko, Herbert Pun I, NP  hydrOXYzine (ATARAX/VISTARIL) 25 MG tablet Take 1 tablet (25 mg total) by mouth 3 (three) times daily as needed for anxiety. 11/24/17  Yes Lindell Spar I, NP  insulin glargine (LANTUS) 100 UNIT/ML injection Inject 0.3 mLs (30 Units total) into the skin at bedtime. For diabetes management 11/24/17  Yes Lindell Spar I, NP  insulin lispro (HUMALOG) 100 UNIT/ML injection Inject 0.05 mLs (5 Units total) into the skin 3 (three) times daily with meals. For diabetes management Patient taking differently: Inject 4 Units into the skin 3 (three) times daily with meals. For diabetes management 11/24/17  Yes Lindell Spar I, NP  isosorbide mononitrate (IMDUR) 30 MG 24 hr tablet Take 1 tablet (30 mg total) by mouth daily. For chest 11/25/17  Yes Lindell Spar I, NP  losartan (COZAAR) 50 MG tablet Take 1 tablet (50 mg total) by mouth daily. For high blood pressure 11/25/17  Yes Nwoko, Herbert Pun I, NP  metFORMIN (GLUCOPHAGE) 1000 MG tablet Take 1 tablet (1,000 mg total) by mouth 2 (two) times daily with a meal. For diabetes management 11/24/17  Yes Lindell Spar I, NP  pravastatin (PRAVACHOL) 40 MG tablet Take 1 tablet (40 mg total) by mouth at bedtime. For high cholesterol 11/24/17  Yes Lindell Spar I, NP  sertraline (ZOLOFT) 50 MG tablet Take 1 tablet (50 mg total) by mouth daily. For depression 11/25/17  Yes Lindell Spar I, NP  traZODone (DESYREL) 50 MG tablet Take 1 tablet (50 mg total) by mouth at bedtime as needed for sleep. 11/24/17  Yes Nwoko, Herbert Pun I, NP  ACCU-CHEK SOFTCLIX LANCETS lancets USE TO CHECK BLOOD GLUCOSE LEVELS UP TO 4 TIMES DAILY AS DIRECTED 11/24/17   Lindell Spar I, NP  Insulin Syringe-Needle U-100 (B-D INS SYR MICROFINE 1CC/27G) 27G X 5/8" 1 ML MISC 1 strip by Does not apply  route 3 (three) times daily as needed. For blood sugar checks 11/24/17   Encarnacion Slates, NP    Family History Family History  Problem Relation Age of Onset  . Diabetes Mother   . Hypertension Mother   . Hyperlipidemia Mother   . Diabetes Father   . Cancer Father   . Hypertension Father   . Diabetes Brother   . Heart disease Brother   . Diabetes Sister   . Colon cancer Neg Hx     Social History Social History   Tobacco Use  . Smoking status: Never Smoker  . Smokeless tobacco: Never Used  Substance Use Topics  . Alcohol use: No    Alcohol/week: 2.4 oz    Types: 2 Cans of beer, 2 Shots of liquor per week  . Drug use: Yes    Types: "Crack" cocaine, Cocaine    Comment: last use January 2018     Allergies   Patient has no known allergies.   Review of Systems Review of Systems  Constitutional: Positive for fatigue. Negative for chills and fever.  Respiratory: Positive for shortness of breath.   Cardiovascular: Positive for chest pain. Negative for leg swelling.  Gastrointestinal: Negative for abdominal pain.  Genitourinary: Positive for testicular pain. Negative for penile pain, penile swelling and scrotal swelling.  All other systems reviewed and are negative.    Physical Exam Updated Vital Signs BP (!) 141/85   Pulse (!) 102   Temp 98.5 F (36.9 C) (Oral)   Resp 14   SpO2 99%   Physical Exam  Constitutional: He appears well-developed and well-nourished.  HENT:  Head: Normocephalic and atraumatic.  Eyes: Conjunctivae are normal.  Neck: Neck supple.  Cardiovascular: Regular rhythm.  Murmur heard.  Systolic murmur is present with a grade of 4/6. Tachycardic  Pulmonary/Chest: Effort normal and breath sounds normal. No respiratory distress.  Abdominal: Soft. There is no tenderness.  Genitourinary:  Genitourinary Comments: Patient's testicle is extremely tender to the touch.  No appreciable swelling.  No erythema.  Musculoskeletal: He exhibits no edema.    Neurological: He is alert.  Skin: Skin is warm and dry.  Psychiatric: He has a normal mood and affect.  Nursing note and vitals reviewed.    ED Treatments / Results  Labs (all labs ordered are listed, but only abnormal results are displayed) Labs Reviewed  BASIC METABOLIC PANEL - Abnormal; Notable for the  following components:      Result Value   Glucose, Bld 152 (*)    BUN 23 (*)    Creatinine, Ser 1.78 (*)    GFR calc non Af Amer 38 (*)    GFR calc Af Amer 44 (*)    All other components within normal limits  CBC - Abnormal; Notable for the following components:   RBC 3.78 (*)    Hemoglobin 10.0 (*)    HCT 31.1 (*)    All other components within normal limits  D-DIMER, QUANTITATIVE (NOT AT [facility]) - Abnormal; Notable for the following components:   D-Dimer, Quant 1.70 (*)    All other components within normal limits  RAPID URINE DRUG SCREEN, HOSP PERFORMED - Abnormal; Notable for the following components:   Opiates POSITIVE (*)    All other components within normal limits  URINALYSIS, ROUTINE W REFLEX MICROSCOPIC - Abnormal; Notable for the following components:   APPearance HAZY (*)    Glucose, UA 50 (*)    Protein, ur 100 (*)    Leukocytes, UA SMALL (*)    WBC, UA >50 (*)    Bacteria, UA RARE (*)    All other components within normal limits  I-STAT TROPONIN, ED - Abnormal; Notable for the following components:   Troponin i, poc 0.09 (*)    All other components within normal limits  I-STAT TROPONIN, ED - Abnormal; Notable for the following components:   Troponin i, poc 0.11 (*)    All other components within normal limits  CBG MONITORING, ED - Abnormal; Notable for the following components:   Glucose-Capillary 125 (*)    All other components within normal limits  URINE CULTURE  CULTURE, BLOOD (ROUTINE X 2)  CULTURE, BLOOD (ROUTINE X 2)  BRAIN NATRIURETIC PEPTIDE  TROPONIN I  TROPONIN I  TROPONIN I  GC/CHLAMYDIA PROBE AMP ([place]) NOT AT [facility]     EKG None  Radiology Dg Chest 2 View  Result Date: 02/20/2018 CLINICAL DATA:  Sudden onset sharp chest pain, short of breath EXAM: CHEST - 2 VIEW COMPARISON:  Chest radiograph 04/01/17 FINDINGS: Sternotomy wires overlie normal cardiac silhouette. There is elevation of the RIGHT hemidiaphragm which is similar to the recent CT and new from chest x-ray of 04/01/2017. RIGHT lower lobe consolidations similar to comparison CT. LEFT lung clear. IMPRESSION: Persistent RIGHT lower lobe consolidation. Recommend follow-up CT with contrast to evaluate after appropriate therapy (3-4 weeks). Persistent elevation of the RIGHT hemidiaphragm new from radiograph 04/01/2017 Electronically Signed   By: Suzy Bouchard M.D.   On: 02/20/2018 14:34   Ct Angio Chest Pe W And/or Wo Contrast  Result Date: 02/20/2018 CLINICAL DATA:  LEFT-sided chest pain radiating to RIGHT-sided chest, starting today. PE suspected, intermediate probability, positive D-dimer. EXAM: CT ANGIOGRAPHY CHEST WITH CONTRAST TECHNIQUE: Multidetector CT imaging of the chest was performed using the standard protocol during bolus administration of intravenous contrast. Multiplanar CT image reconstructions and MIPs were obtained to evaluate the vascular anatomy. CONTRAST:  29mL ISOVUE-370 IOPAMIDOL (ISOVUE-370) INJECTION 76% COMPARISON:  Chest CT angiogram dated 02/13/2018. Chest CT angiogram dated 04/02/2017. FINDINGS: Cardiovascular: Evaluation of the peripheral segmental and subsegmental pulmonary artery branches to the LEFT lower lobe is limited due to patient breathing motion artifact, however, there is no pulmonary embolism identified within the main, lobar or central segmental pulmonary arteries to the LEFT lung. There is no pulmonary embolism identified within the main, lobar or segmental pulmonary arteries to the RIGHT lung. Heart size is within  normal limits. No pericardial effusion. No thoracic aortic aneurysm. Scattered mild aortic  atherosclerosis. Status post median sternotomy for presumed CABG. Mediastinum/Nodes: No mass or enlarged lymph nodes seen within the mediastinum or perihilar regions. Esophagus appears normal. Trachea and central bronchi are unremarkable. Lungs/Pleura: Persistent dense consolidation within the RIGHT lower lobe, posterior aspect, not appreciably changed compared to the most recent chest CT angiogram of 02/13/2018, new compared to the earlier chest CT of 04/02/2017. No new lung abnormality.  No pleural effusion or pneumothorax. Upper Abdomen: Limited images of the upper abdomen are unremarkable. Musculoskeletal: Mild degenerative spondylosis throughout the mid and lower thoracic spine. No acute or suspicious osseous finding. Review of the MIP images confirms the above findings. IMPRESSION: 1. No pulmonary embolism seen, with mild study limitations detailed above. 2. Persistent dense consolidation within the RIGHT lower lobe, stable, most likely pneumonia. Consider follow-up chest CT in 4-6 weeks to ensure resolution. Aortic Atherosclerosis (ICD10-I70.0). Electronically Signed   By: Franki Cabot M.D.   On: 02/20/2018 18:43   US Scrotum  Result Date: 02/20/2018 CLINICAL DATA:  67 year old male with acute LEFT testicular pain for 3 days. EXAM: SCROTAL ULTRASOUND DOPPLER ULTRASOUND OF THE TESTICLES TECHNIQUE: Complete ultrasound examination of the testicles, epididymis, and other scrotal structures was performed. Color and spectral Doppler ultrasound were also utilized to evaluate blood flow to the testicles. COMPARISON:  None. FINDINGS: Right testicle Not identified within the scrotal sac or RIGHT inguinal region. Left testicle Measurements: 3.2 x 2.1 x 2.4 cm with increased vascularity. No mass or microlithiasis visualized. Right epididymis:  Not visualized Left epididymis:  Enlarged and hypervascular. Hydrocele:  Small LEFT hydrocele noted. Varicocele:  Mild LEFT varicocele noted Pulsed Doppler interrogation of  the LEFT testicle demonstrates normal low resistance arterial and venous waveforms. IMPRESSION: 1. LEFT epididymo-orchitis with small LEFT hydrocele. No abscess or testicular torsion. 2. RIGHT testicle not identified within the scrotal sac or RIGHT inguinal region. Correlate with history. Electronically Signed   By: Margarette Canada M.D.   On: 02/20/2018 19:10   US Scrotum Doppler  Result Date: 02/20/2018 CLINICAL DATA:  67 year old male with acute LEFT testicular pain for 3 days. EXAM: SCROTAL ULTRASOUND DOPPLER ULTRASOUND OF THE TESTICLES TECHNIQUE: Complete ultrasound examination of the testicles, epididymis, and other scrotal structures was performed. Color and spectral Doppler ultrasound were also utilized to evaluate blood flow to the testicles. COMPARISON:  None. FINDINGS: Right testicle Not identified within the scrotal sac or RIGHT inguinal region. Left testicle Measurements: 3.2 x 2.1 x 2.4 cm with increased vascularity. No mass or microlithiasis visualized. Right epididymis:  Not visualized Left epididymis:  Enlarged and hypervascular. Hydrocele:  Small LEFT hydrocele noted. Varicocele:  Mild LEFT varicocele noted Pulsed Doppler interrogation of the LEFT testicle demonstrates normal low resistance arterial and venous waveforms. IMPRESSION: 1. LEFT epididymo-orchitis with small LEFT hydrocele. No abscess or testicular torsion. 2. RIGHT testicle not identified within the scrotal sac or RIGHT inguinal region. Correlate with history. Electronically Signed   By: Margarette Canada M.D.   On: 02/20/2018 19:10    Procedures Procedures (including critical care time)  Medications Ordered in ED Medications  morphine 4 MG/ML injection 2 mg (2 mg Intravenous Given 02/20/18 2216)  allopurinol (ZYLOPRIM) tablet 300 mg (has no administration in time range)  amantadine (SYMMETREL) capsule 100 mg (has no administration in time range)  amoxicillin-clavulanate (AUGMENTIN) 875-125 MG per tablet 1 tablet (has no  administration in time range)  aspirin chewable tablet 81 mg (has no administration in time  range)  diltiazem (CARDIZEM CD) 24 hr capsule 120 mg (has no administration in time range)  emtricitabine-rilpivir-tenofovir AF (ODEFSEY) 200-25-25 MG per tablet 1 tablet (has no administration in time range)  famotidine (PEPCID) tablet 20 mg (has no administration in time range)  gabapentin (NEURONTIN) capsule 400 mg (has no administration in time range)  hydrochlorothiazide (MICROZIDE) capsule 12.5 mg (has no administration in time range)  hydrOXYzine (ATARAX/VISTARIL) tablet 25 mg (has no administration in time range)  insulin glargine (LANTUS) injection 20 Units (has no administration in time range)  isosorbide mononitrate (IMDUR) 24 hr tablet 30 mg (has no administration in time range)  losartan (COZAAR) tablet 50 mg (has no administration in time range)  pravastatin (PRAVACHOL) tablet 40 mg (has no administration in time range)  sertraline (ZOLOFT) tablet 50 mg (has no administration in time range)  traZODone (DESYREL) tablet 50 mg (has no administration in time range)  nitroGLYCERIN (NITROSTAT) SL tablet 0.4 mg (has no administration in time range)  albuterol (PROVENTIL) (2.5 MG/3ML) 0.083% nebulizer solution 2.5 mg (has no administration in time range)  dextromethorphan-guaiFENesin (MUCINEX DM) 30-600 MG per 12 hr tablet 1 tablet (has no administration in time range)  insulin aspart (novoLOG) injection 0-9 Units (has no administration in time range)  acetaminophen (TYLENOL) tablet 650 mg (has no administration in time range)  ondansetron (ZOFRAN) injection 4 mg (has no administration in time range)  enoxaparin (LOVENOX) injection 40 mg (has no administration in time range)  zolpidem (AMBIEN) tablet 5 mg (has no administration in time range)  ALPRAZolam (XANAX) tablet 0.25 mg (has no administration in time range)  hydrALAZINE (APRESOLINE) injection 5 mg (has no administration in time range)   levofloxacin (LEVAQUIN) IVPB 500 mg (has no administration in time range)  morphine 4 MG/ML injection 4 mg (4 mg Intravenous Given 02/20/18 1727)  iopamidol (ISOVUE-370) 76 % injection (80 mLs  Contrast Given 02/20/18 1812)  sulfamethoxazole-trimethoprim (BACTRIM DS,SEPTRA DS) 800-160 MG per tablet 1 tablet (1 tablet Oral Given 02/20/18 1930)     Initial Impression / Assessment and Plan / ED Course  I have reviewed the triage vital signs and the nursing notes.  Pertinent labs & imaging results that were available during my care of the patient were reviewed by me and considered in my medical decision making (see chart for details).     Given patient's history and physical, will collect CTA of the chest to evaluate for possible pulmonary embolus given recent surgery and patient is no longer being on anticoagulation for his A. fib secondary to surgery.  Patient not in A. fib when presenting to the emergency department today.  CT shows no evidence of pulmonary aimless, however redemonstrated right lower lobe pneumonia.  Ultrasound of the scrotum reveals orchitis/epididymitis.  Patient given 1 dose of double strength Bactrim.  Patient's initial troponin 0.09, repeat 0.11, will admit patient to the hospitalist for further evaluation and care  Final Clinical Impressions(s) / ED Diagnoses   Final diagnoses:  Nonspecific chest pain  Shortness of breath    ED Discharge Orders    None       Chapman Moss, MD 02/20/18 2241    Orlie Dakin, MD 02/21/18 [phone]

## 2018-02-21 ENCOUNTER — Observation Stay (HOSPITAL_COMMUNITY): Payer: Medicare Other

## 2018-02-21 DIAGNOSIS — Z8701 Personal history of pneumonia (recurrent): Secondary | ICD-10-CM | POA: Diagnosis not present

## 2018-02-21 DIAGNOSIS — N183 Chronic kidney disease, stage 3 (moderate): Secondary | ICD-10-CM | POA: Diagnosis not present

## 2018-02-21 DIAGNOSIS — M359 Systemic involvement of connective tissue, unspecified: Secondary | ICD-10-CM | POA: Diagnosis present

## 2018-02-21 DIAGNOSIS — I319 Disease of pericardium, unspecified: Secondary | ICD-10-CM | POA: Diagnosis not present

## 2018-02-21 DIAGNOSIS — F1420 Cocaine dependence, uncomplicated: Secondary | ICD-10-CM | POA: Diagnosis present

## 2018-02-21 DIAGNOSIS — J189 Pneumonia, unspecified organism: Secondary | ICD-10-CM | POA: Diagnosis not present

## 2018-02-21 DIAGNOSIS — I970 Postcardiotomy syndrome: Secondary | ICD-10-CM | POA: Diagnosis present

## 2018-02-21 DIAGNOSIS — Y95 Nosocomial condition: Secondary | ICD-10-CM | POA: Diagnosis present

## 2018-02-21 DIAGNOSIS — Z951 Presence of aortocoronary bypass graft: Secondary | ICD-10-CM | POA: Diagnosis not present

## 2018-02-21 DIAGNOSIS — N433 Hydrocele, unspecified: Secondary | ICD-10-CM | POA: Diagnosis present

## 2018-02-21 DIAGNOSIS — I5030 Unspecified diastolic (congestive) heart failure: Secondary | ICD-10-CM | POA: Diagnosis not present

## 2018-02-21 DIAGNOSIS — R071 Chest pain on breathing: Secondary | ICD-10-CM | POA: Diagnosis not present

## 2018-02-21 DIAGNOSIS — M79609 Pain in unspecified limb: Secondary | ICD-10-CM | POA: Diagnosis not present

## 2018-02-21 DIAGNOSIS — E785 Hyperlipidemia, unspecified: Secondary | ICD-10-CM | POA: Diagnosis not present

## 2018-02-21 DIAGNOSIS — B20 Human immunodeficiency virus [HIV] disease: Secondary | ICD-10-CM | POA: Diagnosis not present

## 2018-02-21 DIAGNOSIS — B1920 Unspecified viral hepatitis C without hepatic coma: Secondary | ICD-10-CM | POA: Diagnosis present

## 2018-02-21 DIAGNOSIS — E1165 Type 2 diabetes mellitus with hyperglycemia: Secondary | ICD-10-CM | POA: Diagnosis present

## 2018-02-21 DIAGNOSIS — R0602 Shortness of breath: Secondary | ICD-10-CM | POA: Diagnosis present

## 2018-02-21 DIAGNOSIS — N453 Epididymo-orchitis: Secondary | ICD-10-CM | POA: Diagnosis not present

## 2018-02-21 DIAGNOSIS — I2510 Atherosclerotic heart disease of native coronary artery without angina pectoris: Secondary | ICD-10-CM | POA: Diagnosis not present

## 2018-02-21 DIAGNOSIS — E138 Other specified diabetes mellitus with unspecified complications: Secondary | ICD-10-CM | POA: Diagnosis not present

## 2018-02-21 DIAGNOSIS — F1410 Cocaine abuse, uncomplicated: Secondary | ICD-10-CM | POA: Diagnosis not present

## 2018-02-21 DIAGNOSIS — N451 Epididymitis: Secondary | ICD-10-CM | POA: Diagnosis not present

## 2018-02-21 DIAGNOSIS — I129 Hypertensive chronic kidney disease with stage 1 through stage 4 chronic kidney disease, or unspecified chronic kidney disease: Secondary | ICD-10-CM | POA: Diagnosis present

## 2018-02-21 DIAGNOSIS — E1122 Type 2 diabetes mellitus with diabetic chronic kidney disease: Secondary | ICD-10-CM | POA: Diagnosis not present

## 2018-02-21 DIAGNOSIS — Z21 Asymptomatic human immunodeficiency virus [HIV] infection status: Secondary | ICD-10-CM | POA: Diagnosis present

## 2018-02-21 DIAGNOSIS — M109 Gout, unspecified: Secondary | ICD-10-CM | POA: Diagnosis present

## 2018-02-21 DIAGNOSIS — Z9079 Acquired absence of other genital organ(s): Secondary | ICD-10-CM | POA: Diagnosis not present

## 2018-02-21 DIAGNOSIS — R402413 Glasgow coma scale score 13-15, at hospital admission: Secondary | ICD-10-CM | POA: Diagnosis not present

## 2018-02-21 DIAGNOSIS — Z5189 Encounter for other specified aftercare: Secondary | ICD-10-CM | POA: Diagnosis not present

## 2018-02-21 DIAGNOSIS — I4891 Unspecified atrial fibrillation: Secondary | ICD-10-CM | POA: Diagnosis present

## 2018-02-21 DIAGNOSIS — K219 Gastro-esophageal reflux disease without esophagitis: Secondary | ICD-10-CM | POA: Diagnosis not present

## 2018-02-21 DIAGNOSIS — I10 Essential (primary) hypertension: Secondary | ICD-10-CM | POA: Diagnosis not present

## 2018-02-21 DIAGNOSIS — F329 Major depressive disorder, single episode, unspecified: Secondary | ICD-10-CM | POA: Diagnosis present

## 2018-02-21 DIAGNOSIS — E1140 Type 2 diabetes mellitus with diabetic neuropathy, unspecified: Secondary | ICD-10-CM | POA: Diagnosis not present

## 2018-02-21 DIAGNOSIS — M6281 Muscle weakness (generalized): Secondary | ICD-10-CM | POA: Diagnosis not present

## 2018-02-21 DIAGNOSIS — Z743 Need for continuous supervision: Secondary | ICD-10-CM | POA: Diagnosis not present

## 2018-02-21 DIAGNOSIS — M792 Neuralgia and neuritis, unspecified: Secondary | ICD-10-CM | POA: Diagnosis not present

## 2018-02-21 DIAGNOSIS — I2582 Chronic total occlusion of coronary artery: Secondary | ICD-10-CM | POA: Diagnosis not present

## 2018-02-21 DIAGNOSIS — M94262 Chondromalacia, left knee: Secondary | ICD-10-CM | POA: Diagnosis present

## 2018-02-21 DIAGNOSIS — Z833 Family history of diabetes mellitus: Secondary | ICD-10-CM | POA: Diagnosis not present

## 2018-02-21 DIAGNOSIS — R278 Other lack of coordination: Secondary | ICD-10-CM | POA: Diagnosis not present

## 2018-02-21 DIAGNOSIS — R279 Unspecified lack of coordination: Secondary | ICD-10-CM | POA: Diagnosis not present

## 2018-02-21 DIAGNOSIS — J181 Lobar pneumonia, unspecified organism: Secondary | ICD-10-CM | POA: Diagnosis not present

## 2018-02-21 DIAGNOSIS — E1151 Type 2 diabetes mellitus with diabetic peripheral angiopathy without gangrene: Secondary | ICD-10-CM | POA: Diagnosis present

## 2018-02-21 DIAGNOSIS — N179 Acute kidney failure, unspecified: Secondary | ICD-10-CM | POA: Diagnosis not present

## 2018-02-21 DIAGNOSIS — J45909 Unspecified asthma, uncomplicated: Secondary | ICD-10-CM | POA: Diagnosis present

## 2018-02-21 LAB — GLUCOSE, CAPILLARY
GLUCOSE-CAPILLARY: 120 mg/dL — AB (ref 65–99)
GLUCOSE-CAPILLARY: 169 mg/dL — AB (ref 65–99)
Glucose-Capillary: 126 mg/dL — ABNORMAL HIGH (ref 65–99)

## 2018-02-21 LAB — ECHOCARDIOGRAM COMPLETE
Height: 67 in
Weight: 2771.2 oz

## 2018-02-21 LAB — TROPONIN I
TROPONIN I: 0.09 ng/mL — AB (ref <0.03)
TROPONIN I: 0.1 ng/mL — AB (ref <0.03)

## 2018-02-21 MED ORDER — LEVOFLOXACIN IN D5W 250 MG/50ML IV SOLN
250.0000 mg | INTRAVENOUS | Status: DC
  Administered 2018-02-21 – 2018-02-22 (×2): 250 mg via INTRAVENOUS
  Filled 2018-02-21 (×2): qty 50

## 2018-02-21 MED ORDER — COLCHICINE 0.6 MG PO TABS
0.6000 mg | ORAL_TABLET | Freq: Every day | ORAL | Status: DC
  Administered 2018-02-21 – 2018-02-27 (×7): 0.6 mg via ORAL
  Filled 2018-02-21 (×8): qty 1

## 2018-02-21 MED ORDER — LEVOFLOXACIN IN D5W 500 MG/100ML IV SOLN: 500.0000 mg | INTRAVENOUS | Status: DC

## 2018-02-21 NOTE — Progress Notes (Addendum)
Progress Note  Patient Name: Michael [name] Date of Encounter: 02/21/2018  Primary Cardiologist: Dr. Roselyn Bering, [facility]  Subjective   Recently discharged with pneumonia.  Similar chest pain during that recent admission 1 week ago.  Troponin continues to be known rated 0.09.  Ongoing chest pain nearly all day worse with coughing yesterday.  Clearly pleuritic type discomfort.  Today he says that his chest pain is better but he is now complaining of left leg pain when lifting his leg, radiating up into his left flank and abdomen region.  Inpatient Medications    Scheduled Meds: . allopurinol  300 mg Oral Daily  . amantadine  100 mg Oral Daily  . amoxicillin-clavulanate  1 tablet Oral Q12H  . aspirin  81 mg Oral Daily  . colchicine  0.6 mg Oral Daily  . diltiazem  120 mg Oral Daily  . emtricitabine-rilpivir-tenofovir AF  1 tablet Oral Q breakfast  . enoxaparin (LOVENOX) injection  40 mg Subcutaneous Q24H  . famotidine  20 mg Oral Daily  . gabapentin  400 mg Oral BID  . hydrochlorothiazide  12.5 mg Oral Daily  . insulin aspart  0-9 Units Subcutaneous TID WC  . insulin glargine  20 Units Subcutaneous QHS  . isosorbide mononitrate  30 mg Oral Daily  . losartan  50 mg Oral Daily  . pravastatin  40 mg Oral QHS  . sertraline  50 mg Oral Daily   Continuous Infusions: . levofloxacin (LEVAQUIN) IV Stopped (02/21/18 0003)   PRN Meds: acetaminophen, albuterol, ALPRAZolam, dextromethorphan-guaiFENesin, hydrALAZINE, hydrOXYzine, morphine injection, nitroGLYCERIN, ondansetron (ZOFRAN) IV, traZODone, zolpidem   Vital Signs    Vitals:   02/20/18 2300 02/20/18 2331 02/20/18 2340 02/21/18 0537  BP: (!) 138/91 (!) 135/97  138/79  Pulse: 96 98  96  Resp: 18 18  16   Temp:  98.1 F (36.7 C)  98.6 F (37 C)  TempSrc:  Oral  Oral  SpO2: 100% 100%  97%  Weight:   173 lb 6.4 oz (78.7 kg) 173 lb 3.2 oz (78.6 kg)  Height:   5\' 7"  (1.702 m)     Intake/Output Summary (Last 24  hours) at 02/21/2018 0924 Last data filed at 02/21/2018 0831 Gross per 24 hour  Intake 580 ml  Output 1675 ml  Net -1095 ml   Filed Weights   02/20/18 2340 02/21/18 0537  Weight: 173 lb 6.4 oz (78.7 kg) 173 lb 3.2 oz (78.6 kg)    Telemetry    No adverse arrhythmias- Personally Reviewed  ECG    No evidence of ST segment elevation- Personally Reviewed  Physical Exam   GEN: No acute distress.   Neck: No JVD Cardiac: RRR, no murmurs, rubs, or gallops.  Respiratory:  Right lung base crackles, prior pneumonia noted GI: Soft, nontender, non-distended  MS: No edema; No deformity. Neuro:  Nonfocal  Psych: Normal affect   Labs    Chemistry Recent Labs  Lab 02/17/18 0301 02/20/18 1318  NA  --  137  K  --  4.5  CL  --  102  CO2  --  24  GLUCOSE  --  152*  BUN  --  23*  CREATININE 1.60* 1.78*  CALCIUM  --  9.3  GFRNONAA 43* 38*  GFRAA 50* 44*  ANIONGAP  --  11     Hematology Recent Labs  Lab 02/20/18 1318  WBC 8.2  RBC 3.78*  HGB 10.0*  HCT 31.1*  MCV 82.3  MCH 26.5  MCHC  32.2  RDW 13.1  PLT 259    Cardiac Enzymes Recent Labs  Lab 02/14/18 1744 02/15/18 0728 02/16/18 0805 02/21/18 0350  TROPONINI 0.20* 0.20* 0.19* 0.09*    Recent Labs  Lab 02/20/18 1325 02/20/18 2009  TROPIPOC 0.09* 0.11*     BNP Recent Labs  Lab 02/20/18 1318  BNP 83.0     DDimer  Recent Labs  Lab 02/20/18 1318  DDIMER 1.70*     Radiology    Dg Chest 2 View  Result Date: 02/20/2018 CLINICAL DATA:  Sudden onset sharp chest pain, short of breath EXAM: CHEST - 2 VIEW COMPARISON:  Chest radiograph 04/01/17 FINDINGS: Sternotomy wires overlie normal cardiac silhouette. There is elevation of the RIGHT hemidiaphragm which is similar to the recent CT and new from chest x-ray of 04/01/2017. RIGHT lower lobe consolidations similar to comparison CT. LEFT lung clear. IMPRESSION: Persistent RIGHT lower lobe consolidation. Recommend follow-up CT with contrast to evaluate after  appropriate therapy (3-4 weeks). Persistent elevation of the RIGHT hemidiaphragm new from radiograph 04/01/2017 Electronically Signed   By: Suzy Bouchard M.D.   On: 02/20/2018 14:34   Ct Angio Chest Pe W And/or Wo Contrast  Result Date: 02/20/2018 CLINICAL DATA:  LEFT-sided chest pain radiating to RIGHT-sided chest, starting today. PE suspected, intermediate probability, positive D-dimer. EXAM: CT ANGIOGRAPHY CHEST WITH CONTRAST TECHNIQUE: Multidetector CT imaging of the chest was performed using the standard protocol during bolus administration of intravenous contrast. Multiplanar CT image reconstructions and MIPs were obtained to evaluate the vascular anatomy. CONTRAST:  29mL ISOVUE-370 IOPAMIDOL (ISOVUE-370) INJECTION 76% COMPARISON:  Chest CT angiogram dated 02/13/2018. Chest CT angiogram dated 04/02/2017. FINDINGS: Cardiovascular: Evaluation of the peripheral segmental and subsegmental pulmonary artery branches to the LEFT lower lobe is limited due to patient breathing motion artifact, however, there is no pulmonary embolism identified within the main, lobar or central segmental pulmonary arteries to the LEFT lung. There is no pulmonary embolism identified within the main, lobar or segmental pulmonary arteries to the RIGHT lung. Heart size is within normal limits. No pericardial effusion. No thoracic aortic aneurysm. Scattered mild aortic atherosclerosis. Status post median sternotomy for presumed CABG. Mediastinum/Nodes: No mass or enlarged lymph nodes seen within the mediastinum or perihilar regions. Esophagus appears normal. Trachea and central bronchi are unremarkable. Lungs/Pleura: Persistent dense consolidation within the RIGHT lower lobe, posterior aspect, not appreciably changed compared to the most recent chest CT angiogram of 02/13/2018, new compared to the earlier chest CT of 04/02/2017. No new lung abnormality.  No pleural effusion or pneumothorax. Upper Abdomen: Limited images of the upper  abdomen are unremarkable. Musculoskeletal: Mild degenerative spondylosis throughout the mid and lower thoracic spine. No acute or suspicious osseous finding. Review of the MIP images confirms the above findings. IMPRESSION: 1. No pulmonary embolism seen, with mild study limitations detailed above. 2. Persistent dense consolidation within the RIGHT lower lobe, stable, most likely pneumonia. Consider follow-up chest CT in 4-6 weeks to ensure resolution. Aortic Atherosclerosis (ICD10-I70.0). Electronically Signed   By: Franki Cabot M.D.   On: 02/20/2018 18:43   US Scrotum  Result Date: 02/20/2018 CLINICAL DATA:  68 year old male with acute LEFT testicular pain for 3 days. EXAM: SCROTAL ULTRASOUND DOPPLER ULTRASOUND OF THE TESTICLES TECHNIQUE: Complete ultrasound examination of the testicles, epididymis, and other scrotal structures was performed. Color and spectral Doppler ultrasound were also utilized to evaluate blood flow to the testicles. COMPARISON:  None. FINDINGS: Right testicle Not identified within the scrotal sac or RIGHT inguinal region. Left  testicle Measurements: 3.2 x 2.1 x 2.4 cm with increased vascularity. No mass or microlithiasis visualized. Right epididymis:  Not visualized Left epididymis:  Enlarged and hypervascular. Hydrocele:  Small LEFT hydrocele noted. Varicocele:  Mild LEFT varicocele noted Pulsed Doppler interrogation of the LEFT testicle demonstrates normal low resistance arterial and venous waveforms. IMPRESSION: 1. LEFT epididymo-orchitis with small LEFT hydrocele. No abscess or testicular torsion. 2. RIGHT testicle not identified within the scrotal sac or RIGHT inguinal region. Correlate with history. Electronically Signed   By: Margarette Canada M.D.   On: 02/20/2018 19:10   US Scrotum Doppler  Result Date: 02/20/2018 CLINICAL DATA:  67 year old male with acute LEFT testicular pain for 3 days. EXAM: SCROTAL ULTRASOUND DOPPLER ULTRASOUND OF THE TESTICLES TECHNIQUE: Complete ultrasound  examination of the testicles, epididymis, and other scrotal structures was performed. Color and spectral Doppler ultrasound were also utilized to evaluate blood flow to the testicles. COMPARISON:  None. FINDINGS: Right testicle Not identified within the scrotal sac or RIGHT inguinal region. Left testicle Measurements: 3.2 x 2.1 x 2.4 cm with increased vascularity. No mass or microlithiasis visualized. Right epididymis:  Not visualized Left epididymis:  Enlarged and hypervascular. Hydrocele:  Small LEFT hydrocele noted. Varicocele:  Mild LEFT varicocele noted Pulsed Doppler interrogation of the LEFT testicle demonstrates normal low resistance arterial and venous waveforms. IMPRESSION: 1. LEFT epididymo-orchitis with small LEFT hydrocele. No abscess or testicular torsion. 2. RIGHT testicle not identified within the scrotal sac or RIGHT inguinal region. Correlate with history. Electronically Signed   By: Margarette Canada M.D.   On: 02/20/2018 19:10    Cardiac Studies   CT scan as above did not show any evidence of pericardial effusion.  I do not feel strongly that echocardiogram on this admission needs to be done.  Patient Profile     67 y.o. male with recent CABG 3 weeks ago for severe coronary artery disease ongoing chest pain with history of substance abuse HIV hepatitis diabetes carotid endarterectomy.  Came in once again with chest pain.  Assessment & Plan    Pleuritic atypical chest discomfort  -Agree with admitting physician that this is most likely some type of inflammatory response.  This is clearly not acute coronary syndrome.  He does not need any further invasive or noninvasive cardiovascular evaluation.  I do not feel strongly that an echocardiogram needs to be performed because CT scan of chest performed yesterday did not show any evidence of pericardial effusion. -Agree with administration of colchicine as an anti-inflammatory agent.  This is certainly not a fulminant case of post  pericardiotomy syndrome given lack of pericardial fluid.  No adverse arrhythmias.  No fevers.  No ECG changes.  Continue with aspirin 81.  Gabapentin which he is on may help as well.  Coronary artery disease status post CABG -Stable.  Elevated troponin - Not acute coronary syndrome.    HIV/hepatitis C -Per primary team.  Leg pain - Continue to observe per primary team.  No further recommendations at this time.  We will sign off.  For questions or updates, please contact [place] Please consult [email or web address] for contact info under Cardiology/STEMI.      Signed, Candee Furbish, MD  02/21/2018, 9:24 AM

## 2018-02-21 NOTE — Progress Notes (Addendum)
PROGRESS NOTE        PATIENT DETAILS Name: Michael [name] Age: 67 y.o. Sex: male Date of Birth: [birth year] Admit Date: 02/20/2018 Admitting Physician [name] Costa, MD [id number], [name], PA-C  Brief Narrative: Patient is a 68 y.o. male with recent history of CABG approximately 3 weeks back (4/25), HIV, DM-2 presented to the ED for evaluation of pleuritic chest pain and scrotal pain.  Thought to have post pericardiotomy syndrome as the etiology of chest pain, Doppler ultrasound consistent with orchitis/epididymitis.  Subsequently admitted for further evaluation and treatment.  See below for further details  Subjective: Continues to have pleuritic chest pain.  Intense left testicular pain.  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  Continue colchicine.  No further recommendations from cardiology.  Healthcare associated pneumonia: CT angiogram of the chest shows lower lobe consolidation-remains on levofloxacin-we will increase dosage to 750 mg-follow for now.  We will go and stop Augmentin.  Afebrile without any leukocytosis.  Await blood cultures drawn on 5/17.  Left-sided epididymo-orchitis: Continue levofloxacin-left testicle appears to be intensely tender at this point-Doppler/ultrasound on 5/17- for torsion.  Supportive care-await urine culture and urine GC probe.  CAD status post CABG April 2019: Continue aspirin, statin-appreciate cardiology input  Chronic kidney disease stage III: Creatinine close to usual baseline-follow.  Hypertension: Controlled-continue with Cardizem, HCTZ and Cozaar  HIV: Continue with antiretrovirals, last CD4 count January 2019 was 630.  Depression: Stable without any suicidal/homicidal ideations-continue Zoloft  DVT Prophylaxis: Prophylactic Lovenox   Code Status: Full code  Family  Communication: None at bedside  Disposition Plan: Remain inpatient-Home in the next day or so if clinical improvement continues  Antimicrobial agents: Anti-infectives (From admission, onward)   Start     Dose/Rate Route Frequency Ordered Stop   02/21/18 0800  emtricitabine-rilpivir-tenofovir AF (ODEFSEY) 200-25-25 MG per tablet 1 tablet    Note to Pharmacy:  For HIV infection     1 tablet Oral Daily with breakfast 02/20/18 2145     02/20/18 2200  amoxicillin-clavulanate (AUGMENTIN) 875-125 MG per tablet 1 tablet     1 tablet Oral Every 12 hours 02/20/18 2145     02/20/18 2200  levofloxacin (LEVAQUIN) IVPB 500 mg     500 mg 100 mL/hr over 60 Minutes Intravenous Every 24 hours 02/20/18 2159     02/20/18 1930  sulfamethoxazole-trimethoprim (BACTRIM DS,SEPTRA DS) 800-160 MG per tablet 1 tablet     1 tablet Oral  Once 02/20/18 1921 02/20/18 1930      Procedures: None  CONSULTS:  cardiology  Time spent: 25 minutes-Greater than 50% of this time was spent in counseling, explanation of diagnosis, planning of further management, and coordination of care.  MEDICATIONS: Scheduled Meds: . allopurinol  300 mg Oral Daily  . amantadine  100 mg Oral Daily  . amoxicillin-clavulanate  1 tablet Oral Q12H  . aspirin  81 mg Oral Daily  . colchicine  0.6 mg Oral Daily  . diltiazem  120 mg Oral Daily  . emtricitabine-rilpivir-tenofovir AF  1 tablet Oral Q breakfast  . enoxaparin (LOVENOX) injection  40 mg Subcutaneous Q24H  . famotidine  20 mg Oral Daily  . gabapentin  400 mg Oral BID  . hydrochlorothiazide  12.5 mg Oral Daily  .  insulin aspart  0-9 Units Subcutaneous TID WC  . insulin glargine  20 Units Subcutaneous QHS  . isosorbide mononitrate  30 mg Oral Daily  . losartan  50 mg Oral Daily  . pravastatin  40 mg Oral QHS  . sertraline  50 mg Oral Daily   Continuous Infusions: . levofloxacin (LEVAQUIN) IV Stopped (02/21/18 0003)   PRN Meds:.acetaminophen, albuterol, ALPRAZolam,  dextromethorphan-guaiFENesin, hydrALAZINE, hydrOXYzine, morphine injection, nitroGLYCERIN, ondansetron (ZOFRAN) IV, traZODone, zolpidem   PHYSICAL EXAM: Vital signs: Vitals:   02/20/18 2300 02/20/18 2331 02/20/18 2340 02/21/18 0537  BP: (!) 138/91 (!) 135/97  138/79  Pulse: 96 98  96  Resp: 18 18  16   Temp:  98.1 F (36.7 C)  98.6 F (37 C)  TempSrc:  Oral  Oral  SpO2: 100% 100%  97%  Weight:   78.7 kg (173 lb 6.4 oz) 78.6 kg (173 lb 3.2 oz)  Height:   5\' 7"  (1.702 m)    Filed Weights   02/20/18 2340 02/21/18 0537  Weight: 78.7 kg (173 lb 6.4 oz) 78.6 kg (173 lb 3.2 oz)   Body mass index is 27.13 kg/m.   General appearance :Awake, alert, not in any distress. Speech Clear. Not toxic Looking Eyes:, pupils equally reactive to light and accomodation,no scleral icterus.Pink conjunctiva HEENT: Atraumatic and Normocephalic Neck: supple, no JVD. No cervical lymphadenopathy. No thyromegaly Resp:Good air entry bilaterally, no added sounds  CVS: S1 S2 regular, no murmurs.  GI: Bowel sounds present, Non tender and not distended with no gaurding, rigidity or rebound.No organomegaly.  Left testicle is exquisitely tender Extremities: B/L Lower Ext shows no edema, both legs are warm to touch Neurology:  speech clear,Non focal, sensation is grossly intact. Psychiatric: Normal judgment and insight. Alert and oriented x 3. Normal mood. Musculoskeletal:No digital cyanosis Skin:No Rash, warm and dry Wounds:N/A  I have personally reviewed following labs and imaging studies  LABORATORY DATA: CBC: Recent Labs  Lab 02/20/18 1318  WBC 8.2  HGB 10.0*  HCT 31.1*  MCV 82.3  PLT 709    Basic Metabolic Panel: Recent Labs  Lab 02/17/18 0301 02/20/18 1318  NA  --  137  K  --  4.5  CL  --  102  CO2  --  24  GLUCOSE  --  152*  BUN  --  23*  CREATININE 1.60* 1.78*  CALCIUM  --  9.3    GFR: Estimated Creatinine Clearance: 37.7 mL/min (A) (by C-G formula based on SCr of 1.78 mg/dL  (H)).  Liver Function Tests: No results for input(s): AST, ALT, ALKPHOS, BILITOT, PROT, ALBUMIN in the last 168 hours. No results for input(s): LIPASE, AMYLASE in the last 168 hours. No results for input(s): AMMONIA in the last 168 hours.  Coagulation Profile: No results for input(s): INR, PROTIME in the last 168 hours.  Cardiac Enzymes: Recent Labs  Lab 02/14/18 1744 02/15/18 0728 02/16/18 0805 02/21/18 0350  TROPONINI 0.20* 0.20* 0.19* 0.09*    BNP (last 3 results) No results for input(s): PROBNP in the last 8760 hours.  HbA1C: No results for input(s): HGBA1C in the last 72 hours.  CBG: Recent Labs  Lab 02/17/18 0722 02/17/18 1152 02/20/18 2223 02/20/18 2343 02/21/18 0725  GLUCAP 190* 299* 125* 161* 120*    Lipid Profile: No results for input(s): CHOL, HDL, LDLCALC, TRIG, CHOLHDL, LDLDIRECT in the last 72 hours.  Thyroid Function Tests: No results for input(s): TSH, T4TOTAL, FREET4, T3FREE, THYROIDAB in the last 72 hours.  Anemia Panel:  No results for input(s): VITAMINB12, FOLATE, FERRITIN, TIBC, IRON, RETICCTPCT in the last 72 hours.  Urine analysis:    Component Value Date/Time   COLORURINE YELLOW 02/20/2018 1733   APPEARANCEUR HAZY (A) 02/20/2018 1733   LABSPEC 1.023 02/20/2018 1733   PHURINE 6.0 02/20/2018 1733   GLUCOSEU 50 (A) 02/20/2018 1733   HGBUR NEGATIVE 02/20/2018 1733   BILIRUBINUR NEGATIVE 02/20/2018 1733   KETONESUR NEGATIVE 02/20/2018 1733   PROTEINUR 100 (A) 02/20/2018 1733   UROBILINOGEN 0.2 08/13/2015 1030   NITRITE NEGATIVE 02/20/2018 1733   LEUKOCYTESUR SMALL (A) 02/20/2018 1733    Sepsis Labs: Lactic Acid, Venous    Component Value Date/Time   LATICACIDVEN 2.25 (HH) 03/06/2015 1810    MICROBIOLOGY: Recent Results (from the past 240 hour(s))  Culture, blood (routine x 2)     Status: None   Collection Time: 02/14/18 12:15 AM  Result Value Ref Range Status   Specimen Description BLOOD LEFT ANTECUBITAL  Final   Special  Requests   Final    BOTTLES DRAWN AEROBIC AND ANAEROBIC Blood Culture adequate volume   Culture   Final    NO GROWTH 5 DAYS Performed at [facility], [place] [address], [place], [place] [address]    Report Status 02/19/2018 FINAL  Final  Culture, blood (routine x 2)     Status: None   Collection Time: 02/14/18 12:18 AM  Result Value Ref Range Status   Specimen Description BLOOD RIGHT ANTECUBITAL  Final   Special Requests   Final    BOTTLES DRAWN AEROBIC AND ANAEROBIC Blood Culture adequate volume   Culture   Final    NO GROWTH 5 DAYS Performed at [facility], [place] [address] [place], [place] [address]    Report Status 02/19/2018 FINAL  Final    RADIOLOGY STUDIES/RESULTS: Dg Chest 2 View  Result Date: 02/20/2018 CLINICAL DATA:  Sudden onset sharp chest pain, short of breath EXAM: CHEST - 2 VIEW COMPARISON:  Chest radiograph 04/01/17 FINDINGS: Sternotomy wires overlie normal cardiac silhouette. There is elevation of the RIGHT hemidiaphragm which is similar to the recent CT and new from chest x-ray of 04/01/2017. RIGHT lower lobe consolidations similar to comparison CT. LEFT lung clear. IMPRESSION: Persistent RIGHT lower lobe consolidation. Recommend follow-up CT with contrast to evaluate after appropriate therapy (3-4 weeks). Persistent elevation of the RIGHT hemidiaphragm new from radiograph 04/01/2017 Electronically Signed   By: Suzy Bouchard M.D.   On: 02/20/2018 14:34   Dg Shoulder Right  Result Date: 02/15/2018 CLINICAL DATA:  Acute RIGHT shoulder pain for 4 days. No known injury. Initial encounter. EXAM: RIGHT SHOULDER - 2+ VIEW COMPARISON:  None. FINDINGS: There is no evidence of fracture or dislocation. There is no evidence of arthropathy or other focal bone abnormality. Soft tissues are unremarkable. IMPRESSION: Negative. Electronically Signed   By: Margarette Canada M.D.   On: 02/15/2018 17:53   Ct Angio Chest Pe W And/or Wo Contrast  Result Date: 02/20/2018 CLINICAL  DATA:  LEFT-sided chest pain radiating to RIGHT-sided chest, starting today. PE suspected, intermediate probability, positive D-dimer. EXAM: CT ANGIOGRAPHY CHEST WITH CONTRAST TECHNIQUE: Multidetector CT imaging of the chest was performed using the standard protocol during bolus administration of intravenous contrast. Multiplanar CT image reconstructions and MIPs were obtained to evaluate the vascular anatomy. CONTRAST:  53mL ISOVUE-370 IOPAMIDOL (ISOVUE-370) INJECTION 76% COMPARISON:  Chest CT angiogram dated 02/13/2018. Chest CT angiogram dated 04/02/2017. FINDINGS: Cardiovascular: Evaluation of the peripheral segmental and subsegmental pulmonary artery branches  to the LEFT lower lobe is limited due to patient breathing motion artifact, however, there is no pulmonary embolism identified within the main, lobar or central segmental pulmonary arteries to the LEFT lung. There is no pulmonary embolism identified within the main, lobar or segmental pulmonary arteries to the RIGHT lung. Heart size is within normal limits. No pericardial effusion. No thoracic aortic aneurysm. Scattered mild aortic atherosclerosis. Status post median sternotomy for presumed CABG. Mediastinum/Nodes: No mass or enlarged lymph nodes seen within the mediastinum or perihilar regions. Esophagus appears normal. Trachea and central bronchi are unremarkable. Lungs/Pleura: Persistent dense consolidation within the RIGHT lower lobe, posterior aspect, not appreciably changed compared to the most recent chest CT angiogram of 02/13/2018, new compared to the earlier chest CT of 04/02/2017. No new lung abnormality.  No pleural effusion or pneumothorax. Upper Abdomen: Limited images of the upper abdomen are unremarkable. Musculoskeletal: Mild degenerative spondylosis throughout the mid and lower thoracic spine. No acute or suspicious osseous finding. Review of the MIP images confirms the above findings. IMPRESSION: 1. No pulmonary embolism seen, with mild  study limitations detailed above. 2. Persistent dense consolidation within the RIGHT lower lobe, stable, most likely pneumonia. Consider follow-up chest CT in 4-6 weeks to ensure resolution. Aortic Atherosclerosis (ICD10-I70.0). Electronically Signed   By: Franki Cabot M.D.   On: 02/20/2018 18:43   Ct Angio Chest Pe W And/or Wo Contrast  Result Date: 02/13/2018 CLINICAL DATA:  67 year old male with shortness of breath. EXAM: CT ANGIOGRAPHY CHEST WITH CONTRAST TECHNIQUE: Multidetector CT imaging of the chest was performed using the standard protocol during bolus administration of intravenous contrast. Multiplanar CT image reconstructions and MIPs were obtained to evaluate the vascular anatomy. CONTRAST:  135mL ISOVUE-370 IOPAMIDOL (ISOVUE-370) INJECTION 76% COMPARISON:  Chest radiograph dated 04/01/2017 and CT dated 04/02/2017 FINDINGS: Cardiovascular: There is no cardiomegaly or pericardial effusion. Multi vessel coronary vascular calcification and postsurgical changes of CABG. Mild anterior mediastinal/retrosternal stranding related to recent surgery. No fluid collection. There is mild atherosclerotic calcification of the thoracic aorta. No aneurysmal dilatation or dissection. Evaluation of the pulmonary arteries is limited due to respiratory motion artifact. No pulmonary artery embolus identified. Mediastinum/Nodes: There is no hilar or mediastinal adenopathy. Esophagus and the thyroid gland are grossly unremarkable. No mediastinal fluid collection. Lungs/Pleura: There is an area of consolidative change with air bronchogram in the right lower lobe. Focal area of hazy and ground-glass density in the medial basal segment of the left lower lobe noted which may represent atelectasis/scarring versus infiltrate although an area of infarct is not excluded. There is no pleural effusion or pneumothorax. The central airways are patent. Upper Abdomen: No acute abnormality. Musculoskeletal: Median sternotomy wires.  Degenerative changes of the spine. No acute osseous pathology Review of the MIP images confirms the above findings. IMPRESSION: 1. No CT evidence of pulmonary embolism. 2. Consolidative changes of the right lower lobe most consistent with pneumonia. Clinical correlation and follow-up to resolution recommended. Smaller focal ground-glass density at the left lung base may also represent infection. 3. Multi vessel coronary vascular calcification and postsurgical changes of CABG. No mediastinal fluid collection. Electronically Signed   By: Anner Crete M.D.   On: 02/13/2018 23:28   US Scrotum  Result Date: 02/20/2018 CLINICAL DATA:  67 year old male with acute LEFT testicular pain for 3 days. EXAM: SCROTAL ULTRASOUND DOPPLER ULTRASOUND OF THE TESTICLES TECHNIQUE: Complete ultrasound examination of the testicles, epididymis, and other scrotal structures was performed. Color and spectral Doppler ultrasound were also utilized to evaluate  blood flow to the testicles. COMPARISON:  None. FINDINGS: Right testicle Not identified within the scrotal sac or RIGHT inguinal region. Left testicle Measurements: 3.2 x 2.1 x 2.4 cm with increased vascularity. No mass or microlithiasis visualized. Right epididymis:  Not visualized Left epididymis:  Enlarged and hypervascular. Hydrocele:  Small LEFT hydrocele noted. Varicocele:  Mild LEFT varicocele noted Pulsed Doppler interrogation of the LEFT testicle demonstrates normal low resistance arterial and venous waveforms. IMPRESSION: 1. LEFT epididymo-orchitis with small LEFT hydrocele. No abscess or testicular torsion. 2. RIGHT testicle not identified within the scrotal sac or RIGHT inguinal region. Correlate with history. Electronically Signed   By: Margarette Canada M.D.   On: 02/20/2018 19:10   US Scrotum Doppler  Result Date: 02/20/2018 CLINICAL DATA:  67 year old male with acute LEFT testicular pain for 3 days. EXAM: SCROTAL ULTRASOUND DOPPLER ULTRASOUND OF THE TESTICLES  TECHNIQUE: Complete ultrasound examination of the testicles, epididymis, and other scrotal structures was performed. Color and spectral Doppler ultrasound were also utilized to evaluate blood flow to the testicles. COMPARISON:  None. FINDINGS: Right testicle Not identified within the scrotal sac or RIGHT inguinal region. Left testicle Measurements: 3.2 x 2.1 x 2.4 cm with increased vascularity. No mass or microlithiasis visualized. Right epididymis:  Not visualized Left epididymis:  Enlarged and hypervascular. Hydrocele:  Small LEFT hydrocele noted. Varicocele:  Mild LEFT varicocele noted Pulsed Doppler interrogation of the LEFT testicle demonstrates normal low resistance arterial and venous waveforms. IMPRESSION: 1. LEFT epididymo-orchitis with small LEFT hydrocele. No abscess or testicular torsion. 2. RIGHT testicle not identified within the scrotal sac or RIGHT inguinal region. Correlate with history. Electronically Signed   By: Margarette Canada M.D.   On: 02/20/2018 19:10     LOS: 0 days   Oren Binet, MD  [facility]  If 7PM-7AM, please contact night-coverage  Please page via [email or web address] [email or web address]-click on MD name and type text message  02/21/2018, 10:47 AM

## 2018-02-21 NOTE — Progress Notes (Signed)
  Echocardiogram 2D Echocardiogram has been performed.  Michael [name] 02/21/2018, 3:10 PM

## 2018-02-21 NOTE — Progress Notes (Signed)
Pharmacy Antibiotic Note  Michael [name] is a 67 y.o. male admitted on 02/20/2018 with chest pain.  Pharmacy has been consulted for levaquin dosing for epididymal orchitis and PNA. Pt is afebrile and WBC is WNL. SCr is elevated at 1.78 and CrCl ~ 40   Plan: Levaquin 250mg  IV q24hr Consider change to po if able F/u renal fxn, C&S, clinical status and LOT  Height: 5\' 7"  (170.2 cm) Weight: 173 lb 3.2 oz (78.6 kg) IBW/kg (Calculated) : 66.1  Temp (24hrs), Avg:98.4 F (36.9 C), Min:98.1 F (36.7 C), Max:98.6 F (37 C)  Recent Labs  Lab 02/17/18 0301 02/20/18 1318  WBC  --  8.2  CREATININE 1.60* 1.78*    Estimated Creatinine Clearance: 37.7 mL/min (A) (by C-G formula based on SCr of 1.78 mg/dL (H)).    No Known Allergies  Thank you for allowing pharmacy to be a part of this patient's care.  Hildred Laser, PharmD Clinical Pharmacist 02/21/2018 12:52 PM

## 2018-02-22 ENCOUNTER — Inpatient Hospital Stay (HOSPITAL_COMMUNITY): Payer: Medicare Other

## 2018-02-22 ENCOUNTER — Other Ambulatory Visit (HOSPITAL_COMMUNITY): Payer: Self-pay

## 2018-02-22 LAB — URINE CULTURE: CULTURE: NO GROWTH

## 2018-02-22 LAB — GLUCOSE, CAPILLARY
Glucose-Capillary: 109 mg/dL — ABNORMAL HIGH (ref 65–99)
Glucose-Capillary: 145 mg/dL — ABNORMAL HIGH (ref 65–99)
Glucose-Capillary: 249 mg/dL — ABNORMAL HIGH (ref 65–99)
Glucose-Capillary: 191 mg/dL — ABNORMAL HIGH (ref 65–99)

## 2018-02-22 LAB — BASIC METABOLIC PANEL
Anion gap: 12 (ref 5–15)
BUN: 17 mg/dL (ref 6–20)
CALCIUM: 9.2 mg/dL (ref 8.9–10.3)
CO2: 23 mmol/L (ref 22–32)
CREATININE: 1.68 mg/dL — AB (ref 0.61–1.24)
Chloride: 96 mmol/L — ABNORMAL LOW (ref 101–111)
GFR calc Af Amer: 47 mL/min — ABNORMAL LOW (ref >60)
GFR, EST NON AFRICAN AMERICAN: 40 mL/min — AB (ref >60)
GLUCOSE: 105 mg/dL — AB (ref 65–99)
Potassium: 4.2 mmol/L (ref 3.5–5.1)
Sodium: 131 mmol/L — ABNORMAL LOW (ref 135–145)

## 2018-02-22 LAB — CBC
HEMATOCRIT: 31.7 % — AB (ref 39.0–52.0)
Hemoglobin: 10 g/dL — ABNORMAL LOW (ref 13.0–17.0)
MCH: 25.5 pg — ABNORMAL LOW (ref 26.0–34.0)
MCHC: 31.5 g/dL (ref 30.0–36.0)
MCV: 80.9 fL (ref 78.0–100.0)
PLATELETS: 213 10*3/uL (ref 150–400)
RBC: 3.92 MIL/uL — ABNORMAL LOW (ref 4.22–5.81)
RDW: 13.2 % (ref 11.5–15.5)
WBC: 10.5 10*3/uL (ref 4.0–10.5)

## 2018-02-22 MED ORDER — SODIUM CHLORIDE 0.9 % IV SOLN
1.0000 g | INTRAVENOUS | Status: DC
  Administered 2018-02-22 – 2018-02-24 (×3): 1 g via INTRAVENOUS
  Filled 2018-02-22 (×3): qty 1

## 2018-02-22 NOTE — Progress Notes (Addendum)
PROGRESS NOTE        PATIENT DETAILS Name: Michael [name] Age: 67 y.o. Sex: male Date of Birth: Feb 14, 1951 Admit Date: 02/20/2018 Admitting Physician [name] Costa, MD ONG:EXBMW, [name], PA-C  Brief Narrative: Patient is a 67 y.o. male with recent history of CABG approximately 3 weeks back (4/25), HIV, DM-2 presented to the ED for evaluation of pleuritic chest pain and scrotal pain.  Thought to have post pericardiotomy syndrome as the etiology of chest pain, Doppler ultrasound consistent with orchitis/epididymitis.  Subsequently admitted for further evaluation and treatment.  See below for further details  Subjective: Chest pain is only minimal at this time, continues to have severe left testicular pain.  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  CT angiogram of the chest was negative for PE.  TTE negative for wall motion abnormalities, EF 55-60%.  No further recommendations from cardiology.  Healthcare associated pneumonia: CT angiogram of the chest shows lower lobe consolidation-continue levofloxacin.  Remains on levofloxacin-blood cultures on 5/17- so far.  To be improving-without any fever or leukocytosis.  Continue to follow clinically  Left-sided epididymo-orchitis: Continues to have severe left testicular pain-he will not let me even do a light touch on exam.  Repeat Doppler ultrasound to make sure there is no torsion, continue levofloxacin, apply scrotal support.  Case discussed with urology (Dr. Siri Cole Doppler ultrasound negative-will take a few days more for improvement, suggest we do a better scan to make sure that the patient does not have some amount of retention.  Await urine GC probe-but patient claims he has not been sexually active for the past few months.    Addendum 2:15 PM: Informed by radiology a few minutes back regarding possibility of emphysematous orchitis on repeat Doppler  ultrasound.  I have ordered a stat CT of the pelvis as well.  Case was subsequently discussed with urology-Dr. Shanna Cisco we broaden coverage by adding cefepime, he will evaluate and provide further recommendations as well.  CAD status post CABG April 2019: See above regarding chest pain-continue aspirin, statin.  Follow.    Chronic kidney disease stage III: Creatinine close to usual baseline-continue to follow periodically.  Hypertension: Controlled-continue with Cardizem, HCTZ and Cozaar.    HIV: Continue with antiretrovirals, last CD4 count January 2019 was 630.  Depression: Stable without any suicidal/homicidal ideations-continue Zoloft  DVT Prophylaxis: Prophylactic Lovenox   Code Status: Full code  Family Communication: None at bedside  Disposition Plan: Remain inpatient-Home in the next day or so if clinical improvement continues  Antimicrobial agents: Anti-infectives (From admission, onward)   Start     Dose/Rate Route Frequency Ordered Stop   02/21/18 2200  levofloxacin (LEVAQUIN) IVPB 500 mg  Status:  Discontinued     500 mg 100 mL/hr over 60 Minutes Intravenous Every 24 hours 02/21/18 1057 02/21/18 1253   02/21/18 2200  Levofloxacin (LEVAQUIN) IVPB 250 mg     250 mg 50 mL/hr over 60 Minutes Intravenous Every 24 hours 02/21/18 1253     02/21/18 0800  emtricitabine-rilpivir-tenofovir AF (ODEFSEY) 200-25-25 MG per tablet 1 tablet    Note to Pharmacy:  For HIV infection     1 tablet Oral Daily with breakfast 02/20/18 2145     02/20/18 2200  amoxicillin-clavulanate (AUGMENTIN) 875-125 MG per tablet 1 tablet  Status:  Discontinued     1 tablet Oral  Every 12 hours 02/20/18 2145 02/21/18 1100   02/20/18 2200  levofloxacin (LEVAQUIN) IVPB 500 mg  Status:  Discontinued     500 mg 100 mL/hr over 60 Minutes Intravenous Every 24 hours 02/20/18 2159 02/21/18 1057   02/20/18 1930  sulfamethoxazole-trimethoprim (BACTRIM DS,SEPTRA DS) 800-160 MG per tablet 1 tablet     1  tablet Oral  Once 02/20/18 1921 02/20/18 1930      Procedures: None  CONSULTS:  cardiology  Time spent: 35 minutes-Greater than 50% of this time was spent in counseling, explanation of diagnosis, planning of further management, and coordination of care.  MEDICATIONS: Scheduled Meds: . allopurinol  300 mg Oral Daily  . amantadine  100 mg Oral Daily  . aspirin  81 mg Oral Daily  . colchicine  0.6 mg Oral Daily  . diltiazem  120 mg Oral Daily  . emtricitabine-rilpivir-tenofovir AF  1 tablet Oral Q breakfast  . enoxaparin (LOVENOX) injection  40 mg Subcutaneous Q24H  . famotidine  20 mg Oral Daily  . gabapentin  400 mg Oral BID  . hydrochlorothiazide  12.5 mg Oral Daily  . insulin aspart  0-9 Units Subcutaneous TID WC  . insulin glargine  20 Units Subcutaneous QHS  . isosorbide mononitrate  30 mg Oral Daily  . losartan  50 mg Oral Daily  . pravastatin  40 mg Oral QHS  . sertraline  50 mg Oral Daily   Continuous Infusions: . levofloxacin (LEVAQUIN) IV Stopped (02/21/18 2300)   PRN Meds:.acetaminophen, albuterol, ALPRAZolam, dextromethorphan-guaiFENesin, hydrALAZINE, hydrOXYzine, morphine injection, nitroGLYCERIN, ondansetron (ZOFRAN) IV, traZODone, zolpidem   PHYSICAL EXAM: Vital signs: Vitals:   02/21/18 1917 02/22/18 0319 02/22/18 0332 02/22/18 0859  BP: 130/83 98/79 121/84 131/82  Pulse: (!) 104 (!) 112 (!) 108 (!) 103  Resp: 16 18    Temp: 98.9 F (37.2 C) 99.2 F (37.3 C)    TempSrc: Oral Oral    SpO2: 100% 98%    Weight:  76.7 kg (169 lb 3.2 oz)    Height:       Filed Weights   02/20/18 2340 02/21/18 0537 02/22/18 0319  Weight: 78.7 kg (173 lb 6.4 oz) 78.6 kg (173 lb 3.2 oz) 76.7 kg (169 lb 3.2 oz)   Body mass index is 26.5 kg/m.   General appearance:Awake, alert, not in any distress.  Eyes:no scleral icterus. HEENT: Atraumatic and Normocephalic Neck: supple, no JVD. Resp:Good air entry bilaterally,no rales or rhonchi CVS: S1 S2 regular, no murmurs.    GI: Bowel sounds present, Non tender and not distended with no gaurding, rigidity or rebound.left testicle continues to be slightly enlarged-and exquisitely tender limiting further exam. Extremities: B/L Lower Ext shows no edema, both legs are warm to touch Neurology:  Non focal Psychiatric: Normal judgment and insight. Normal mood. Musculoskeletal:No digital cyanosis Skin:No Rash, warm and dry Wounds:N/A  I have personally reviewed following labs and imaging studies  LABORATORY DATA: CBC: Recent Labs  Lab 02/20/18 1318 02/22/18 0715  WBC 8.2 10.5  HGB 10.0* 10.0*  HCT 31.1* 31.7*  MCV 82.3 80.9  PLT 259 202    Basic Metabolic Panel: Recent Labs  Lab 02/17/18 0301 02/20/18 1318 02/22/18 0715  NA  --  137 131*  K  --  4.5 4.2  CL  --  102 96*  CO2  --  24 23  GLUCOSE  --  152* 105*  BUN  --  23* 17  CREATININE 1.60* 1.78* 1.68*  CALCIUM  --  9.3 9.2  GFR: Estimated Creatinine Clearance: 39.9 mL/min (A) (by C-G formula based on SCr of 1.68 mg/dL (H)).  Liver Function Tests: No results for input(s): AST, ALT, ALKPHOS, BILITOT, PROT, ALBUMIN in the last 168 hours. No results for input(s): LIPASE, AMYLASE in the last 168 hours. No results for input(s): AMMONIA in the last 168 hours.  Coagulation Profile: No results for input(s): INR, PROTIME in the last 168 hours.  Cardiac Enzymes: Recent Labs  Lab 02/16/18 0805 02/21/18 0350 02/21/18 0928  TROPONINI 0.19* 0.09* 0.10*    BNP (last 3 results) No results for input(s): PROBNP in the last 8760 hours.  HbA1C: No results for input(s): HGBA1C in the last 72 hours.  CBG: Recent Labs  Lab 02/20/18 2343 02/21/18 0725 02/21/18 1159 02/21/18 1631 02/22/18 0739  GLUCAP 161* 120* 126* 169* 109*    Lipid Profile: No results for input(s): CHOL, HDL, LDLCALC, TRIG, CHOLHDL, LDLDIRECT in the last 72 hours.  Thyroid Function Tests: No results for input(s): TSH, T4TOTAL, FREET4, T3FREE, THYROIDAB in the last  72 hours.  Anemia Panel: No results for input(s): VITAMINB12, FOLATE, FERRITIN, TIBC, IRON, RETICCTPCT in the last 72 hours.  Urine analysis:    Component Value Date/Time   COLORURINE YELLOW 02/20/2018 1733   APPEARANCEUR HAZY (A) 02/20/2018 1733   LABSPEC 1.023 02/20/2018 1733   PHURINE 6.0 02/20/2018 1733   GLUCOSEU 50 (A) 02/20/2018 1733   HGBUR NEGATIVE 02/20/2018 1733   BILIRUBINUR NEGATIVE 02/20/2018 1733   KETONESUR NEGATIVE 02/20/2018 1733   PROTEINUR 100 (A) 02/20/2018 1733   UROBILINOGEN 0.2 08/13/2015 1030   NITRITE NEGATIVE 02/20/2018 1733   LEUKOCYTESUR SMALL (A) 02/20/2018 1733    Sepsis Labs: Lactic Acid, Venous    Component Value Date/Time   LATICACIDVEN 2.25 (HH) 03/06/2015 1810    MICROBIOLOGY: Recent Results (from the past 240 hour(s))  Culture, blood (routine x 2)     Status: None   Collection Time: 02/14/18 12:15 AM  Result Value Ref Range Status   Specimen Description BLOOD LEFT ANTECUBITAL  Final   Special Requests   Final    BOTTLES DRAWN AEROBIC AND ANAEROBIC Blood Culture adequate volume   Culture   Final    NO GROWTH 5 DAYS Performed at [facility], [place] [address] [place], [place] [address]    Report Status 02/19/2018 FINAL  Final  Culture, blood (routine x 2)     Status: None   Collection Time: 02/14/18 12:18 AM  Result Value Ref Range Status   Specimen Description BLOOD RIGHT ANTECUBITAL  Final   Special Requests   Final    BOTTLES DRAWN AEROBIC AND ANAEROBIC Blood Culture adequate volume   Culture   Final    NO GROWTH 5 DAYS Performed at [facility], [place] [address], [place], [place] [address]    Report Status 02/19/2018 FINAL  Final  Culture, blood (Routine X 2) w Reflex to ID Panel     Status: None (Preliminary result)   Collection Time: 02/20/18 10:10 PM  Result Value Ref Range Status   Specimen Description BLOOD BLOOD RIGHT WRIST  Final   Special Requests   Final    BOTTLES DRAWN AEROBIC AND ANAEROBIC Blood  Culture adequate volume   Culture   Final    NO GROWTH < 24 HOURS Performed at [facility], [place] [address], [place], [place] [address]    Report Status PENDING  Incomplete  Culture, blood (Routine X 2) w Reflex to ID Panel  Status: None (Preliminary result)   Collection Time: 02/20/18 10:30 PM  Result Value Ref Range Status   Specimen Description BLOOD LEFT ANTECUBITAL  Final   Special Requests   Final    BOTTLES DRAWN AEROBIC AND ANAEROBIC Blood Culture adequate volume   Culture   Final    NO GROWTH < 12 HOURS Performed at [facility], [address] [place], [place] [address]    Report Status PENDING  Incomplete    RADIOLOGY STUDIES/RESULTS: Dg Chest 2 View  Result Date: 02/20/2018 CLINICAL DATA:  Sudden onset sharp chest pain, short of breath EXAM: CHEST - 2 VIEW COMPARISON:  Chest radiograph 04/01/17 FINDINGS: Sternotomy wires overlie normal cardiac silhouette. There is elevation of the RIGHT hemidiaphragm which is similar to the recent CT and new from chest x-ray of 04/01/2017. RIGHT lower lobe consolidations similar to comparison CT. LEFT lung clear. IMPRESSION: Persistent RIGHT lower lobe consolidation. Recommend follow-up CT with contrast to evaluate after appropriate therapy (3-4 weeks). Persistent elevation of the RIGHT hemidiaphragm new from radiograph 04/01/2017 Electronically Signed   By: Suzy Bouchard M.D.   On: 02/20/2018 14:34   Dg Shoulder Right  Result Date: 02/15/2018 CLINICAL DATA:  Acute RIGHT shoulder pain for 4 days. No known injury. Initial encounter. EXAM: RIGHT SHOULDER - 2+ VIEW COMPARISON:  None. FINDINGS: There is no evidence of fracture or dislocation. There is no evidence of arthropathy or other focal bone abnormality. Soft tissues are unremarkable. IMPRESSION: Negative. Electronically Signed   By: Margarette Canada M.D.   On: 02/15/2018 17:53   Ct Angio Chest Pe W And/or Wo Contrast  Result Date: 02/20/2018 CLINICAL DATA:  LEFT-sided chest  pain radiating to RIGHT-sided chest, starting today. PE suspected, intermediate probability, positive D-dimer. EXAM: CT ANGIOGRAPHY CHEST WITH CONTRAST TECHNIQUE: Multidetector CT imaging of the chest was performed using the standard protocol during bolus administration of intravenous contrast. Multiplanar CT image reconstructions and MIPs were obtained to evaluate the vascular anatomy. CONTRAST:  56mL ISOVUE-370 IOPAMIDOL (ISOVUE-370) INJECTION 76% COMPARISON:  Chest CT angiogram dated 02/13/2018. Chest CT angiogram dated 04/02/2017. FINDINGS: Cardiovascular: Evaluation of the peripheral segmental and subsegmental pulmonary artery branches to the LEFT lower lobe is limited due to patient breathing motion artifact, however, there is no pulmonary embolism identified within the main, lobar or central segmental pulmonary arteries to the LEFT lung. There is no pulmonary embolism identified within the main, lobar or segmental pulmonary arteries to the RIGHT lung. Heart size is within normal limits. No pericardial effusion. No thoracic aortic aneurysm. Scattered mild aortic atherosclerosis. Status post median sternotomy for presumed CABG. Mediastinum/Nodes: No mass or enlarged lymph nodes seen within the mediastinum or perihilar regions. Esophagus appears normal. Trachea and central bronchi are unremarkable. Lungs/Pleura: Persistent dense consolidation within the RIGHT lower lobe, posterior aspect, not appreciably changed compared to the most recent chest CT angiogram of 02/13/2018, new compared to the earlier chest CT of 04/02/2017. No new lung abnormality.  No pleural effusion or pneumothorax. Upper Abdomen: Limited images of the upper abdomen are unremarkable. Musculoskeletal: Mild degenerative spondylosis throughout the mid and lower thoracic spine. No acute or suspicious osseous finding. Review of the MIP images confirms the above findings. IMPRESSION: 1. No pulmonary embolism seen, with mild study limitations  detailed above. 2. Persistent dense consolidation within the RIGHT lower lobe, stable, most likely pneumonia. Consider follow-up chest CT in 4-6 weeks to ensure resolution. Aortic Atherosclerosis (ICD10-I70.0). Electronically Signed   By: Franki Cabot M.D.   On: 02/20/2018  18:43   Ct Angio Chest Pe W And/or Wo Contrast  Result Date: 02/13/2018 CLINICAL DATA:  67 year old male with shortness of breath. EXAM: CT ANGIOGRAPHY CHEST WITH CONTRAST TECHNIQUE: Multidetector CT imaging of the chest was performed using the standard protocol during bolus administration of intravenous contrast. Multiplanar CT image reconstructions and MIPs were obtained to evaluate the vascular anatomy. CONTRAST:  131mL ISOVUE-370 IOPAMIDOL (ISOVUE-370) INJECTION 76% COMPARISON:  Chest radiograph dated 04/01/2017 and CT dated 04/02/2017 FINDINGS: Cardiovascular: There is no cardiomegaly or pericardial effusion. Multi vessel coronary vascular calcification and postsurgical changes of CABG. Mild anterior mediastinal/retrosternal stranding related to recent surgery. No fluid collection. There is mild atherosclerotic calcification of the thoracic aorta. No aneurysmal dilatation or dissection. Evaluation of the pulmonary arteries is limited due to respiratory motion artifact. No pulmonary artery embolus identified. Mediastinum/Nodes: There is no hilar or mediastinal adenopathy. Esophagus and the thyroid gland are grossly unremarkable. No mediastinal fluid collection. Lungs/Pleura: There is an area of consolidative change with air bronchogram in the right lower lobe. Focal area of hazy and ground-glass density in the medial basal segment of the left lower lobe noted which may represent atelectasis/scarring versus infiltrate although an area of infarct is not excluded. There is no pleural effusion or pneumothorax. The central airways are patent. Upper Abdomen: No acute abnormality. Musculoskeletal: Median sternotomy wires. Degenerative changes of  the spine. No acute osseous pathology Review of the MIP images confirms the above findings. IMPRESSION: 1. No CT evidence of pulmonary embolism. 2. Consolidative changes of the right lower lobe most consistent with pneumonia. Clinical correlation and follow-up to resolution recommended. Smaller focal ground-glass density at the left lung base may also represent infection. 3. Multi vessel coronary vascular calcification and postsurgical changes of CABG. No mediastinal fluid collection. Electronically Signed   By: Anner Crete M.D.   On: 02/13/2018 23:28   US Scrotum  Result Date: 02/20/2018 CLINICAL DATA:  67 year old male with acute LEFT testicular pain for 3 days. EXAM: SCROTAL ULTRASOUND DOPPLER ULTRASOUND OF THE TESTICLES TECHNIQUE: Complete ultrasound examination of the testicles, epididymis, and other scrotal structures was performed. Color and spectral Doppler ultrasound were also utilized to evaluate blood flow to the testicles. COMPARISON:  None. FINDINGS: Right testicle Not identified within the scrotal sac or RIGHT inguinal region. Left testicle Measurements: 3.2 x 2.1 x 2.4 cm with increased vascularity. No mass or microlithiasis visualized. Right epididymis:  Not visualized Left epididymis:  Enlarged and hypervascular. Hydrocele:  Small LEFT hydrocele noted. Varicocele:  Mild LEFT varicocele noted Pulsed Doppler interrogation of the LEFT testicle demonstrates normal low resistance arterial and venous waveforms. IMPRESSION: 1. LEFT epididymo-orchitis with small LEFT hydrocele. No abscess or testicular torsion. 2. RIGHT testicle not identified within the scrotal sac or RIGHT inguinal region. Correlate with history. Electronically Signed   By: Margarette Canada M.D.   On: 02/20/2018 19:10   US Scrotum Doppler  Result Date: 02/20/2018 CLINICAL DATA:  67 year old male with acute LEFT testicular pain for 3 days. EXAM: SCROTAL ULTRASOUND DOPPLER ULTRASOUND OF THE TESTICLES TECHNIQUE: Complete ultrasound  examination of the testicles, epididymis, and other scrotal structures was performed. Color and spectral Doppler ultrasound were also utilized to evaluate blood flow to the testicles. COMPARISON:  None. FINDINGS: Right testicle Not identified within the scrotal sac or RIGHT inguinal region. Left testicle Measurements: 3.2 x 2.1 x 2.4 cm with increased vascularity. No mass or microlithiasis visualized. Right epididymis:  Not visualized Left epididymis:  Enlarged and hypervascular. Hydrocele:  Small LEFT hydrocele noted.  Varicocele:  Mild LEFT varicocele noted Pulsed Doppler interrogation of the LEFT testicle demonstrates normal low resistance arterial and venous waveforms. IMPRESSION: 1. LEFT epididymo-orchitis with small LEFT hydrocele. No abscess or testicular torsion. 2. RIGHT testicle not identified within the scrotal sac or RIGHT inguinal region. Correlate with history. Electronically Signed   By: Margarette Canada M.D.   On: 02/20/2018 19:10     LOS: 1 day   Oren Binet, MD  [facility]  If 7PM-7AM, please contact night-coverage  Please page via [email or web address]-click on MD name and type text message  02/22/2018, 10:07 AM

## 2018-02-22 NOTE — Consult Note (Signed)
Urology Consult Note   Requesting Attending Physician:  Jonetta Osgood, MD Service Providing Consult: Urology  Consulting Attending: Louis Meckel     Reason for Consult:  Epididymal orchitis   HPI: Michael [name] is seen in consultation for reasons noted above at the request of Jonetta Osgood, MD for evaluation of Epididymal orchitis.  This is a 67 y.o. male with hx of HIV, afib, CAD s/p recent CABG, HTN, solitary left testicle with epididymal orchitis.   Presented to ED on 5/17 w/ chest and scrotal pain. Has had testicular pain for the past 2 days. In Ed was febrile but tachy to 110 WBC - 8.2  Cr - 1.78 U/A - smal LE, negative nitrites, rare bacteria, >50WBC Urine / Blood culture - NGTD Scrotal ultrasound - Left Epidiymo-orchitis, normal flow, no abscess or torsion   Exam consistent with epididymal orchitis. Started on Levaquin.  Over the past two days his scrotal pain has not improved. He continues to be afebrile and tachycardic.   Repeat scrotal ultrasound on 5/19 showed worsening epididymal orchitis with multiple echogenic foci concerning or emphysematous epididymitis.  Unfortunately, CT did not fully image the scrotum but of the region captured, no free or subq air was noted.   Hx of HIV  - no antiretroviral's, CD4 in 10/2017 was 630. Recent CABG for CAD in 4/25  Past Medical History: Past Medical History:  Diagnosis Date  . A-fib ([place])   . Asthma    No PFTs, history of childhood asthma  . CAD (coronary artery disease)    a. 06/2013 STEMI/PCI ([facility]): LAD w/ thrombus (treated with BMS), mid 75%, D2 75%; LCX OM2 75%; RCA small, PDA 95%, PLV 95%;  b. 10/2013 Cath/PCI: ISR w/in LAD (Promus DES x 2), borderline OM2 lesion;  c. 01/2014 MV: Intermediate risk, medium-sized distal ant wall infarct w/ very small amt of peri-infarct ischemia. EF 60%.  . Cellulitis 04/2014   left facial  . Chondromalacia of medial femoral condyle    Left knee MRI 04/28/12: Chondromalacia of the medial  femoral condyle with slight peripheral degeneration of the meniscocapsular junction of the medial meniscus; followed by sports medicine  . Collagen vascular disease ([place])   . Crack cocaine use    for 20+ years, has been enrolled in detox programs in the past  . Depression    with history of hospitalization for suicidal ideation  . Diabetes mellitus 2002   Diagnosed in 2002, started insulin in 2012  . Gout   . Gout 04/28/2012  . Headache(784.0)    CT head 08/2011: Periventricular and subcortical white matter hypodensities are most in keeping with chronic microangiopathic change  . HIV infection [facility]) Nov 2012   Followed by [name]  . Hyperlipidemia   . Hypertension   . Pulmonary embolism [facility])     Past Surgical History:  Past Surgical History:  Procedure Laterality Date  . BACK SURGERY     1988  . BOWEL RESECTION    . CARDIAC SURGERY    . CERVICAL SPINE SURGERY     " rods in my neck "  . CORONARY STENT PLACEMENT    . NM MYOCAR PERF WALL MOTION  12/27/2011   normal  . SPINE SURGERY      Medication: Current Facility-Administered Medications  Medication Dose Route Frequency Provider Last Rate Last Dose  . acetaminophen (TYLENOL) tablet 650 mg  650 mg Oral Q4H PRN [name] Costa, MD      . albuterol (PROVENTIL) (2.5 MG/3ML)  0.083% nebulizer solution 2.5 mg  2.5 mg Nebulization Q4H PRN [name] Costa, MD      . allopurinol (ZYLOPRIM) tablet 300 mg  300 mg Oral Daily [name] Costa, MD   300 mg at 02/22/18 0855  . ALPRAZolam Duanne Moron) tablet 0.25 mg  0.25 mg Oral BID PRN [name] Costa, MD      . amantadine (SYMMETREL) capsule 100 mg  100 mg Oral Daily [name] Costa, MD   100 mg at 02/22/18 0855  . aspirin chewable tablet 81 mg  81 mg Oral Daily [name] Costa, MD   81 mg at 02/22/18 0854  . ceFEPIme (MAXIPIME) 1 g in sodium chloride 0.9 % 100 mL IVPB  1 g Intravenous Q24H Durwin Nora, [place]      . colchicine tablet 0.6 mg  0.6 mg Oral Daily [name] Costa, MD   0.6 mg at 02/22/18 0855  .  dextromethorphan-guaiFENesin (MUCINEX DM) 30-600 MG per 12 hr tablet 1 tablet  1 tablet Oral BID PRN [name] Costa, MD      . diltiazem (CARDIZEM CD) 24 hr capsule 120 mg  120 mg Oral Daily [name] Costa, MD   120 mg at 02/22/18 0856  . emtricitabine-rilpivir-tenofovir AF (ODEFSEY) 200-25-25 MG per tablet 1 tablet  1 tablet Oral Q breakfast [name] Costa, MD   1 tablet at 02/22/18 0856  . enoxaparin (LOVENOX) injection 40 mg  40 mg Subcutaneous Q24H [name] Costa, MD   40 mg at 02/21/18 2232  . famotidine (PEPCID) tablet 20 mg  20 mg Oral Daily [name] Costa, MD   20 mg at 02/22/18 0855  . gabapentin (NEURONTIN) capsule 400 mg  400 mg Oral BID [name] Costa, MD   400 mg at 02/22/18 0855  . hydrALAZINE (APRESOLINE) injection 5 mg  5 mg Intravenous Q2H PRN [name] Costa, MD      . hydrochlorothiazide (MICROZIDE) capsule 12.5 mg  12.5 mg Oral Daily [name] Costa, MD   12.5 mg at 02/22/18 0854  . hydrOXYzine (ATARAX/VISTARIL) tablet 25 mg  25 mg Oral TID PRN [name] Costa, MD      . insulin aspart (novoLOG) injection 0-9 Units  0-9 Units Subcutaneous TID WC [name] Costa, MD   1 Units at 02/22/18 1252  . insulin glargine (LANTUS) injection 20 Units  20 Units Subcutaneous QHS [name] Costa, MD   20 Units at 02/21/18 2228  . isosorbide mononitrate (IMDUR) 24 hr tablet 30 mg  30 mg Oral Daily [name] Costa, MD   30 mg at 02/22/18 0855  . Levofloxacin (LEVAQUIN) IVPB 250 mg  250 mg Intravenous Q24H Kris Mouton, [facility]   Stopped at 02/21/18 2300  . losartan (COZAAR) tablet 50 mg  50 mg Oral Daily [name] Costa, MD   50 mg at 02/22/18 0855  . morphine 4 MG/ML injection 2 mg  2 mg Intravenous Q3H PRN [name] Costa, MD   2 mg at 02/22/18 1510  . nitroGLYCERIN (NITROSTAT) SL tablet 0.4 mg  0.4 mg Sublingual Q5 min PRN [name] Costa, MD      . ondansetron [facility]) injection 4 mg  4 mg Intravenous Q6H PRN [name] Costa, MD      . pravastatin (PRAVACHOL) tablet 40 mg  40 mg Oral QHS [name] Costa, MD   40 mg at 02/21/18 2227  . sertraline (ZOLOFT) tablet 50 mg   50 mg Oral Daily [name] Costa, MD   50 mg at 02/22/18 0855  . traZODone (DESYREL) tablet 50 mg  50 mg Oral QHS PRN Blaine Hamper,  Soledad Gerlach, MD      . zolpidem [facility]) tablet 5 mg  5 mg Oral QHS PRN [name] Costa, MD   5 mg at 02/21/18 2227    Allergies: No Known Allergies  Social History: Social History   Tobacco Use  . Smoking status: Never Smoker  . Smokeless tobacco: Never Used  Substance Use Topics  . Alcohol use: No    Alcohol/week: 2.4 oz    Types: 2 Cans of beer, 2 Shots of liquor per week  . Drug use: Yes    Types: "Crack" cocaine, Cocaine    Comment: last use January 2018    Family History Family History  Problem Relation Age of Onset  . Diabetes Mother   . Hypertension Mother   . Hyperlipidemia Mother   . Diabetes Father   . Cancer Father   . Hypertension Father   . Diabetes Brother   . Heart disease Brother   . Diabetes Sister   . Colon cancer Neg Hx     Review of Systems 10 systems were reviewed and are negative except as noted specifically in the HPI.  Objective   Vital signs in last 24 hours: BP 117/82 (BP Location: Right Arm)   Pulse (!) 105   Temp 98.5 F (36.9 C) (Oral)   Resp 18   Ht 5\' 7"  (1.702 m)   Wt 76.7 kg (169 lb 3.2 oz) Comment: scale c  SpO2 97%   BMI 26.50 kg/m   Physical Exam General: NAD, A&O, resting, appropriate HEENT: [place]/AT, EOMI, MMM Pulmonary: Normal work of breathing Cardiovascular: HDS, adequate peripheral perfusion Abdomen: Soft, NTTP, nondistended, . GU: normal circumcised phallus, absent right testicle, exquisitely tender left testicle - especially posteriorly. Scrotal skin is soft and non indurated or erythematous. No crepitus.  Extremities: warm and well perfused Neuro: Appropriate, no focal neurological deficits  Most Recent Labs: Lab Results  Component Value Date   WBC 10.5 02/22/2018   HGB 10.0 (L) 02/22/2018   HCT 31.7 (L) 02/22/2018   PLT 213 02/22/2018    Lab Results  Component Value Date   NA 131 (L)  02/22/2018   K 4.2 02/22/2018   CL 96 (L) 02/22/2018   CO2 23 02/22/2018   BUN 17 02/22/2018   CREATININE 1.68 (H) 02/22/2018   CALCIUM 9.2 02/22/2018   MG 1.7 03/06/2015    Lab Results  Component Value Date   INR 0.95 03/03/2012   APTT 30 08/26/2011     IMAGING: Ct Angio Chest Pe W And/or Wo Contrast  Result Date: 02/20/2018 CLINICAL DATA:  LEFT-sided chest pain radiating to RIGHT-sided chest, starting today. PE suspected, intermediate probability, positive D-dimer. EXAM: CT ANGIOGRAPHY CHEST WITH CONTRAST TECHNIQUE: Multidetector CT imaging of the chest was performed using the standard protocol during bolus administration of intravenous contrast. Multiplanar CT image reconstructions and MIPs were obtained to evaluate the vascular anatomy. CONTRAST:  52mL ISOVUE-370 IOPAMIDOL (ISOVUE-370) INJECTION 76% COMPARISON:  Chest CT angiogram dated 02/13/2018. Chest CT angiogram dated 04/02/2017. FINDINGS: Cardiovascular: Evaluation of the peripheral segmental and subsegmental pulmonary artery branches to the LEFT lower lobe is limited due to patient breathing motion artifact, however, there is no pulmonary embolism identified within the main, lobar or central segmental pulmonary arteries to the LEFT lung. There is no pulmonary embolism identified within the main, lobar or segmental pulmonary arteries to the RIGHT lung. Heart size is within normal limits. No pericardial effusion. No thoracic aortic aneurysm. Scattered mild aortic atherosclerosis. Status post median sternotomy for presumed  CABG. Mediastinum/Nodes: No mass or enlarged lymph nodes seen within the mediastinum or perihilar regions. Esophagus appears normal. Trachea and central bronchi are unremarkable. Lungs/Pleura: Persistent dense consolidation within the RIGHT lower lobe, posterior aspect, not appreciably changed compared to the most recent chest CT angiogram of 02/13/2018, new compared to the earlier chest CT of 04/02/2017. No new lung  abnormality.  No pleural effusion or pneumothorax. Upper Abdomen: Limited images of the upper abdomen are unremarkable. Musculoskeletal: Mild degenerative spondylosis throughout the mid and lower thoracic spine. No acute or suspicious osseous finding. Review of the MIP images confirms the above findings. IMPRESSION: 1. No pulmonary embolism seen, with mild study limitations detailed above. 2. Persistent dense consolidation within the RIGHT lower lobe, stable, most likely pneumonia. Consider follow-up chest CT in 4-6 weeks to ensure resolution. Aortic Atherosclerosis (ICD10-I70.0). Electronically Signed   By: Franki Cabot M.D.   On: 02/20/2018 18:43   US Scrotum  Result Date: 02/20/2018 CLINICAL DATA:  67 year old male with acute LEFT testicular pain for 3 days. EXAM: SCROTAL ULTRASOUND DOPPLER ULTRASOUND OF THE TESTICLES TECHNIQUE: Complete ultrasound examination of the testicles, epididymis, and other scrotal structures was performed. Color and spectral Doppler ultrasound were also utilized to evaluate blood flow to the testicles. COMPARISON:  None. FINDINGS: Right testicle Not identified within the scrotal sac or RIGHT inguinal region. Left testicle Measurements: 3.2 x 2.1 x 2.4 cm with increased vascularity. No mass or microlithiasis visualized. Right epididymis:  Not visualized Left epididymis:  Enlarged and hypervascular. Hydrocele:  Small LEFT hydrocele noted. Varicocele:  Mild LEFT varicocele noted Pulsed Doppler interrogation of the LEFT testicle demonstrates normal low resistance arterial and venous waveforms. IMPRESSION: 1. LEFT epididymo-orchitis with small LEFT hydrocele. No abscess or testicular torsion. 2. RIGHT testicle not identified within the scrotal sac or RIGHT inguinal region. Correlate with history. Electronically Signed   By: Margarette Canada M.D.   On: 02/20/2018 19:10   US Scrotum Doppler  Result Date: 02/20/2018 CLINICAL DATA:  67 year old male with acute LEFT testicular pain for 3  days. EXAM: SCROTAL ULTRASOUND DOPPLER ULTRASOUND OF THE TESTICLES TECHNIQUE: Complete ultrasound examination of the testicles, epididymis, and other scrotal structures was performed. Color and spectral Doppler ultrasound were also utilized to evaluate blood flow to the testicles. COMPARISON:  None. FINDINGS: Right testicle Not identified within the scrotal sac or RIGHT inguinal region. Left testicle Measurements: 3.2 x 2.1 x 2.4 cm with increased vascularity. No mass or microlithiasis visualized. Right epididymis:  Not visualized Left epididymis:  Enlarged and hypervascular. Hydrocele:  Small LEFT hydrocele noted. Varicocele:  Mild LEFT varicocele noted Pulsed Doppler interrogation of the LEFT testicle demonstrates normal low resistance arterial and venous waveforms. IMPRESSION: 1. LEFT epididymo-orchitis with small LEFT hydrocele. No abscess or testicular torsion. 2. RIGHT testicle not identified within the scrotal sac or RIGHT inguinal region. Correlate with history. Electronically Signed   By: Margarette Canada M.D.   On: 02/20/2018 19:10   US Scrotum W/doppler  Result Date: 02/22/2018 CLINICAL DATA:  Left-sided pelvic pain EXAM: SCROTAL ULTRASOUND DOPPLER ULTRASOUND OF THE TESTICLES TECHNIQUE: Complete ultrasound examination of the testicles, epididymis, and other scrotal structures was performed. Color and spectral Doppler ultrasound were also utilized to evaluate blood flow to the testicles. COMPARISON:  Two days ago FINDINGS: Right testicle Surgically absent, reportedly due to trauma in the 1980s Left testicle Measurements: 32 x 24 x 25 mm. Hypervascularity. No collection or heterogeneity. Right epididymis:  Surgically absent Left epididymis: Masslike thickening with hypervascularity that has progressed from  prior. There are multiple echogenic foci with possible mild shadowing, increased from prior. Hydrocele:  Negative for  pyocele Varicocele:  Not assessed in this setting Pulsed Doppler interrogation of the  left testicle demonstrates normal low resistance arterial and venous waveforms. These results were called by telephone at the time of interpretation on 02/22/2018 at 1:41 pm to Dr. Oren Binet. IMPRESSION: 1. Left epididymo-orchitis with worsening swelling from 2 days ago. There are multiple echogenic foci within the thickened epididymis, emphysematous epididymitis is a consideration in this immunocompromised diabetic. Noncontrast pelvis CT could further evaluate. 2. No abscess or pyocele. 3. Remote right orchiectomy. Electronically Signed   By: Monte Fantasia M.D.   On: 02/22/2018 13:41    ------  Assessment:  67 y.o. male with hx of HIV on therapy and solitary left testicle with epididymal orchitis. Worsening on recent scrotal ultrasound with some echogenic foci. CT non optimal as it did not fully image the testicles.   Given the hx of a solitary kidney, we will proceed conservatively. Operative interpretation at this point, with no clear diagnosis of an abscess or pyocele may risk the entire testicle. Recommend BS abx for 48 hours and repeat scrotal ultrasound. If patient exam worsens or ultrasound shows continued worsening of epididmal orchitis, we will re-evaluate the need for an operative intervention.   Recommend ice to the scrotum, constant scrotal elevation and NSAIDS for pain relief.     Thank you for this consult. Please contact the urology consult pager with any further questions/concerns.

## 2018-02-22 NOTE — Progress Notes (Signed)
Pharmacy Antibiotic Note  Michael [name] is a 67 y.o. male admitted on 02/20/2018 with chest pain.  Pharmacy has been consulted for levaquin dosing for epididymal orchitis and PNA. Pt is afebrile and WBC is WNL. SCr is elevated ~ 1.7 and CrCl ~ 40.  Antibiotic therapy now being broadened due to possible worsening of his orchitis and we are being asked to start him on IV Cefepime as well.  Plan: Levaquin 250mg  IV q24hr Cefepime 1gm IV q24 hours F/u renal fxn, C&S, clinical status and LOT  Height: 5\' 7"  (170.2 cm) Weight: 169 lb 3.2 oz (76.7 kg)(scale c) IBW/kg (Calculated) : 66.1  Temp (24hrs), Avg:98.9 F (37.2 C), Min:98.5 F (36.9 C), Max:99.2 F (37.3 C)  Recent Labs  Lab 02/17/18 0301 02/20/18 1318 02/22/18 0715  WBC  --  8.2 10.5  CREATININE 1.60* 1.78* 1.68*    Estimated Creatinine Clearance: 39.9 mL/min (A) (by C-G formula based on SCr of 1.68 mg/dL (H)).    No Known Allergies  Rober Minion, PharmD., MS Clinical Pharmacist Pager:  [phone] Thank you for allowing pharmacy to be part of this patients care team. 02/22/2018 2:39 PM

## 2018-02-23 DIAGNOSIS — N179 Acute kidney failure, unspecified: Secondary | ICD-10-CM

## 2018-02-23 LAB — BASIC METABOLIC PANEL
Anion gap: 12 (ref 5–15)
BUN: 26 mg/dL — ABNORMAL HIGH (ref 6–20)
CHLORIDE: 97 mmol/L — AB (ref 101–111)
CO2: 23 mmol/L (ref 22–32)
Calcium: 9 mg/dL (ref 8.9–10.3)
Creatinine, Ser: 2.03 mg/dL — ABNORMAL HIGH (ref 0.61–1.24)
GFR calc non Af Amer: 32 mL/min — ABNORMAL LOW (ref >60)
GFR, EST AFRICAN AMERICAN: 37 mL/min — AB (ref >60)
Glucose, Bld: 182 mg/dL — ABNORMAL HIGH (ref 65–99)
POTASSIUM: 4 mmol/L (ref 3.5–5.1)
Sodium: 132 mmol/L — ABNORMAL LOW (ref 135–145)

## 2018-02-23 LAB — CBC
HEMATOCRIT: 30 % — AB (ref 39.0–52.0)
HEMOGLOBIN: 9.5 g/dL — AB (ref 13.0–17.0)
MCH: 25.5 pg — AB (ref 26.0–34.0)
MCHC: 31.7 g/dL (ref 30.0–36.0)
MCV: 80.6 fL (ref 78.0–100.0)
Platelets: 197 10*3/uL (ref 150–400)
RBC: 3.72 MIL/uL — ABNORMAL LOW (ref 4.22–5.81)
RDW: 13.3 % (ref 11.5–15.5)
WBC: 8 10*3/uL (ref 4.0–10.5)

## 2018-02-23 LAB — GLUCOSE, CAPILLARY
GLUCOSE-CAPILLARY: 213 mg/dL — AB (ref 65–99)
Glucose-Capillary: 176 mg/dL — ABNORMAL HIGH (ref 65–99)
Glucose-Capillary: 210 mg/dL — ABNORMAL HIGH (ref 65–99)

## 2018-02-23 LAB — GC/CHLAMYDIA PROBE AMP (~~LOC~~) NOT AT ARMC
Chlamydia: NEGATIVE
NEISSERIA GONORRHEA: NEGATIVE

## 2018-02-23 MED ORDER — OXYCODONE HCL 5 MG PO TABS
5.0000 mg | ORAL_TABLET | Freq: Four times a day (QID) | ORAL | Status: DC | PRN
  Administered 2018-02-23 – 2018-02-27 (×8): 5 mg via ORAL
  Filled 2018-02-23 (×8): qty 1

## 2018-02-23 MED ORDER — SODIUM CHLORIDE 0.9 % IV SOLN
INTRAVENOUS | Status: AC
  Administered 2018-02-23: 12:00:00 via INTRAVENOUS

## 2018-02-23 MED ORDER — POLYETHYLENE GLYCOL 3350 17 G PO PACK
17.0000 g | PACK | Freq: Every day | ORAL | Status: DC
  Administered 2018-02-23 – 2018-02-27 (×5): 17 g via ORAL
  Filled 2018-02-23 (×5): qty 1

## 2018-02-23 MED ORDER — SODIUM CHLORIDE 0.9 % IV SOLN
100.0000 mg | Freq: Two times a day (BID) | INTRAVENOUS | Status: DC
  Administered 2018-02-23 – 2018-02-27 (×7): 100 mg via INTRAVENOUS
  Filled 2018-02-23 (×9): qty 100

## 2018-02-23 NOTE — Consult Note (Signed)
   [facility] CM Inpatient Consult   02/23/2018  Michael [name] 1950/12/12 [id number]  Patient screened for re-admission and high risk score for unplanned readmission with [employer].  Patient admitted with chest pain, HX of CABG approximately 3 weeks ago.  HX includes but not limited to DM-2, HIV and now with left lower lobe consolidation a recent HX of HCAP, also Epididymitis.   Spoke with patient at the bedside.  Patient states that he was at a skilled facility [[name]] and he plans to return there as he had 11 more days for rehab.  Patient confirms his cell phone number is the best contact number at [phone] and he states he has a voice mail set up.  Will follow for disposition and needs.  Will follow for progress and disposition, likely SNF.  He states his brother provides his transportation.  He endorses Dr. Cheree Ditto as his primary care provider.   Please place a [facility] Care Management consult or for questions contact:   Natividad Brood, RN BSN [facility] Liaison  [phone] business mobile phone Toll free office [phone]

## 2018-02-23 NOTE — Clinical Social Work Note (Signed)
Clinical Social Work Assessment  Patient Details  Name: Michael [name] MRN: [id number] Date of Birth: Nov 13, 1950  Date of referral:  02/23/18               Reason for consult:  Discharge Planning                Permission sought to share information with:  Chartered certified accountant granted to share information::  Yes, Verbal Permission Granted  Name::        Agency::  [facility]  Relationship::     Contact Information:     Housing/Transportation Living arrangements for the past 2 months:  [place], [place] of Information:  Patient, Medical Team Patient Interpreter Needed:  None Criminal Activity/Legal Involvement Pertinent to Current Situation/Hospitalization:  No - Comment as needed Significant Relationships:  Siblings, Adult Children Lives with:    Do you feel safe going back to the place where you live?  Yes Need for family participation in patient care:  Yes (Comment)  Care giving concerns:  Patient is a short-term rehab resident at [facility].   Social Worker assessment / plan:  CSW met with patient. No supports at bedside. CSW introduced role and explained that discharge planning would be discussed. Patient confirmed he was admitted from [facility] and plans to return at discharge. Per SNF admissions coordinator, he has used about 10 SNF days. Patient aware he will have a copay after day 20. PT/OT evaluations pending. CSW paged MD for orders. No further concerns. CSW encouraged patient to contact CSW as needed. CSW will continue to follow patient for support and facilitate discharge to SNF once medically stable.  Employment status:  Disabled (Comment on whether or not currently receiving Disability) Insurance information:  Managed Medicare PT Recommendations:  Not assessed at this time Information / Referral to community resources:  [place]  Patient/Family's Response to care:  Patient  agreeable to return to SNF. Patient's family supportive and involved in patient's care. Patient appreciated social work intervention.  Patient/Family's Understanding of and Emotional Response to Diagnosis, Current Treatment, and Prognosis:  Patient has a good understanding of the reason for admission and plan to return to SNF once medically stable. Patient appears happy with hospital care.  Emotional Assessment Appearance:  Appears stated age Attitude/Demeanor/Rapport:  Engaged, Gracious Affect (typically observed):  Accepting, Appropriate, Calm, Pleasant Orientation:  Oriented to Self, Oriented to Place, Oriented to  Time, Oriented to Situation Alcohol / Substance use:  Illicit Drugs Psych involvement (Current and /or in the community):  No (Comment)  Discharge Needs  Concerns to be addressed:  Care Coordination Readmission within the last 30 days:  Yes Current discharge risk:  Dependent with Mobility Barriers to Discharge:  Continued Medical Work up, [place], LCSW 02/23/2018, 12:31 PM

## 2018-02-23 NOTE — Progress Notes (Signed)
PROGRESS NOTE        PATIENT DETAILS Name: Michael [name] Age: 67 y.o. Sex: male Date of Birth: Mar 06, 1951 Admit Date: 02/20/2018 Admitting Physician [name] Costa, MD JOI:NOMVE, [name], PA-C  Brief Narrative: Patient is a 67 y.o. male with recent history of CABG approximately 3 weeks back (4/25), HIV, DM-2 presented to the ED for evaluation of pleuritic chest pain and scrotal pain.  Thought to have post pericardiotomy syndrome as the etiology of chest pain, Doppler ultrasound consistent with orchitis/epididymitis.  Subsequently admitted for further evaluation and treatment.  See below for further details  Subjective: Left testicular pain is essentially unchanged.  No chest pain or shortness of breath.  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  CT angiogram of the chest was negative for pulmonary embolism.  TTE showed preserved EF, with no wall motion abnormalities.  No further recommendations from cardiology.   Healthcare associated pneumonia: CT angiogram of the chest shows lower lobe consolidation-continue levofloxacin.  Remains on levofloxacin-blood cultures on 5/17- so far.  Improving without any fever or leukocytosis.  Left-sided epididymo-orchitis: Continues to have severe left testicular pain that is essentially unchanged from yesterday.  Due to worsening pain, a Doppler ultrasound was repeated-it was negative for torsion, however had worsening orchitis in spite of being on levofloxacin for close to 48 hours.  Case was discussed with urology-cefepime was added to broaden microbial coverage.  CT scan of the pelvis-although incompletely imaged-did not show major issues with the scrotum.  Urology following with recommendations to repeat a Doppler 48 hours from yesterday.  Urine GC probe still pending.. Supportive care-add oral narcotics for moderate pain-continue with as needed  IV narcotics for severe pain.  CAD status post CABG April 2019: See above regarding chest pain-continue aspirin, statin.  Follow.    Acute kidney injury chronic kidney disease stage III: Slight bump in patient's creatinine-start gentle hydration, recheck tomorrow morning, hold diuretics and Cozaar for now.  Hypertension: Controlled-due to increase in creatinine-holding HCTZ and Cozaar-continue with Cardizem for now.    HIV: Continue with antiretrovirals, last CD4 count January 2019 was 630.  Depression: Stable without any suicidal/homicidal ideation-continue Zoloft.    Telemetry (independently reviewed): Sinus rhythm  DVT Prophylaxis: Prophylactic Lovenox   Code Status: Full code  Family Communication: None at bedside  Disposition Plan: Remain inpatient-home sometime later this week  Antimicrobial agents: Anti-infectives (From admission, onward)   Start     Dose/Rate Route Frequency Ordered Stop   02/22/18 1445  ceFEPIme (MAXIPIME) 1 g in sodium chloride 0.9 % 100 mL IVPB     1 g 200 mL/hr over 30 Minutes Intravenous Every 24 hours 02/22/18 1438     02/21/18 2200  levofloxacin (LEVAQUIN) IVPB 500 mg  Status:  Discontinued     500 mg 100 mL/hr over 60 Minutes Intravenous Every 24 hours 02/21/18 1057 02/21/18 1253   02/21/18 2200  Levofloxacin (LEVAQUIN) IVPB 250 mg     250 mg 50 mL/hr over 60 Minutes Intravenous Every 24 hours 02/21/18 1253     02/21/18 0800  emtricitabine-rilpivir-tenofovir AF (ODEFSEY) 200-25-25 MG per tablet 1 tablet    Note to Pharmacy:  For HIV infection     1 tablet Oral Daily with breakfast 02/20/18 2145     02/20/18 2200  amoxicillin-clavulanate (AUGMENTIN) 875-125 MG per tablet 1 tablet  Status:  Discontinued     1 tablet Oral Every 12 hours 02/20/18 2145 02/21/18 1100   02/20/18 2200  levofloxacin (LEVAQUIN) IVPB 500 mg  Status:  Discontinued     500 mg 100 mL/hr over 60 Minutes Intravenous Every 24 hours 02/20/18 2159 02/21/18 1057   02/20/18  1930  sulfamethoxazole-trimethoprim (BACTRIM DS,SEPTRA DS) 800-160 MG per tablet 1 tablet     1 tablet Oral  Once 02/20/18 1921 02/20/18 1930      Procedures: None  CONSULTS:  cardiology  Time spent: 35 minutes-Greater than 50% of this time was spent in counseling, explanation of diagnosis, planning of further management, and coordination of care.  MEDICATIONS: Scheduled Meds: . allopurinol  300 mg Oral Daily  . amantadine  100 mg Oral Daily  . aspirin  81 mg Oral Daily  . colchicine  0.6 mg Oral Daily  . diltiazem  120 mg Oral Daily  . emtricitabine-rilpivir-tenofovir AF  1 tablet Oral Q breakfast  . enoxaparin (LOVENOX) injection  40 mg Subcutaneous Q24H  . famotidine  20 mg Oral Daily  . gabapentin  400 mg Oral BID  . hydrochlorothiazide  12.5 mg Oral Daily  . insulin aspart  0-9 Units Subcutaneous TID WC  . insulin glargine  20 Units Subcutaneous QHS  . isosorbide mononitrate  30 mg Oral Daily  . losartan  50 mg Oral Daily  . polyethylene glycol  17 g Oral Daily  . pravastatin  40 mg Oral QHS  . sertraline  50 mg Oral Daily   Continuous Infusions: . ceFEPime (MAXIPIME) IV 1 g (02/22/18 1806)  . levofloxacin (LEVAQUIN) IV Stopped (02/23/18 0044)   PRN Meds:.acetaminophen, albuterol, ALPRAZolam, dextromethorphan-guaiFENesin, hydrALAZINE, hydrOXYzine, morphine injection, nitroGLYCERIN, ondansetron (ZOFRAN) IV, oxyCODONE, traZODone, zolpidem   PHYSICAL EXAM: Vital signs: Vitals:   02/22/18 1136 02/22/18 2000 02/22/18 2340 02/23/18 0518  BP: 117/82 110/73 105/81 116/69  Pulse: (!) 105 97 94 93  Resp: 18 18  18   Temp: 98.5 F (36.9 C) 99.8 F (37.7 C)  98.1 F (36.7 C)  TempSrc: Oral Oral  Oral  SpO2: 97% 100%  99%  Weight:    75.6 kg (166 lb 9.6 oz)  Height:       Filed Weights   02/21/18 0537 02/22/18 0319 02/23/18 0518  Weight: 78.6 kg (173 lb 3.2 oz) 76.7 kg (169 lb 3.2 oz) 75.6 kg (166 lb 9.6 oz)   Body mass index is 26.09 kg/m.   General  appearance:Awake, alert, not in any distress.  Eyes:no scleral icterus. HEENT: Atraumatic and Normocephalic Neck: supple, no JVD. Resp:Good air entry bilaterally,no rales or rhonchi CVS: S1 S2 regular, no murmurs.  GI: Bowel sounds present, Non tender and not distended with no gaurding, rigidity or rebound.scrotum continues to be exquisitely tender Extremities: B/L Lower Ext shows no edema, both legs are warm to touch Neurology:  Non focal Psychiatric: Normal judgment and insight. Normal mood. Musculoskeletal:No digital cyanosis Skin:No Rash, warm and dry Wounds:N/A  I have personally reviewed following labs and imaging studies  LABORATORY DATA: CBC: Recent Labs  Lab 02/20/18 1318 02/22/18 0715 02/23/18 0725  WBC 8.2 10.5 8.0  HGB 10.0* 10.0* 9.5*  HCT 31.1* 31.7* 30.0*  MCV 82.3 80.9 80.6  PLT 259 213 914    Basic Metabolic Panel: Recent Labs  Lab 02/17/18 0301 02/20/18 1318 02/22/18 0715 02/23/18 0725  NA  --  137 131* 132*  K  --  4.5 4.2 4.0  CL  --  102 96*  97*  CO2  --  24 23 23   GLUCOSE  --  152* 105* 182*  BUN  --  23* 17 26*  CREATININE 1.60* 1.78* 1.68* 2.03*  CALCIUM  --  9.3 9.2 9.0    GFR: Estimated Creatinine Clearance: 33 mL/min (A) (by C-G formula based on SCr of 2.03 mg/dL (H)).  Liver Function Tests: No results for input(s): AST, ALT, ALKPHOS, BILITOT, PROT, ALBUMIN in the last 168 hours. No results for input(s): LIPASE, AMYLASE in the last 168 hours. No results for input(s): AMMONIA in the last 168 hours.  Coagulation Profile: No results for input(s): INR, PROTIME in the last 168 hours.  Cardiac Enzymes: Recent Labs  Lab 02/21/18 0350 02/21/18 0928  TROPONINI 0.09* 0.10*    BNP (last 3 results) No results for input(s): PROBNP in the last 8760 hours.  HbA1C: No results for input(s): HGBA1C in the last 72 hours.  CBG: Recent Labs  Lab 02/21/18 1631 02/22/18 0739 02/22/18 1135 02/22/18 1619 02/22/18 2150  GLUCAP 169* 109*  145* 249* 191*    Lipid Profile: No results for input(s): CHOL, HDL, LDLCALC, TRIG, CHOLHDL, LDLDIRECT in the last 72 hours.  Thyroid Function Tests: No results for input(s): TSH, T4TOTAL, FREET4, T3FREE, THYROIDAB in the last 72 hours.  Anemia Panel: No results for input(s): VITAMINB12, FOLATE, FERRITIN, TIBC, IRON, RETICCTPCT in the last 72 hours.  Urine analysis:    Component Value Date/Time   COLORURINE YELLOW 02/20/2018 1733   APPEARANCEUR HAZY (A) 02/20/2018 1733   LABSPEC 1.023 02/20/2018 1733   PHURINE 6.0 02/20/2018 1733   GLUCOSEU 50 (A) 02/20/2018 1733   HGBUR NEGATIVE 02/20/2018 1733   BILIRUBINUR NEGATIVE 02/20/2018 1733   KETONESUR NEGATIVE 02/20/2018 1733   PROTEINUR 100 (A) 02/20/2018 1733   UROBILINOGEN 0.2 08/13/2015 1030   NITRITE NEGATIVE 02/20/2018 1733   LEUKOCYTESUR SMALL (A) 02/20/2018 1733    Sepsis Labs: Lactic Acid, Venous    Component Value Date/Time   LATICACIDVEN 2.25 (HH) 03/06/2015 1810    MICROBIOLOGY: Recent Results (from the past 240 hour(s))  Culture, blood (routine x 2)     Status: None   Collection Time: 02/14/18 12:15 AM  Result Value Ref Range Status   Specimen Description BLOOD LEFT ANTECUBITAL  Final   Special Requests   Final    BOTTLES DRAWN AEROBIC AND ANAEROBIC Blood Culture adequate volume   Culture   Final    NO GROWTH 5 DAYS Performed at [facility], [place] [address] [place], [place] [address]    Report Status 02/19/2018 FINAL  Final  Culture, blood (routine x 2)     Status: None   Collection Time: 02/14/18 12:18 AM  Result Value Ref Range Status   Specimen Description BLOOD RIGHT ANTECUBITAL  Final   Special Requests   Final    BOTTLES DRAWN AEROBIC AND ANAEROBIC Blood Culture adequate volume   Culture   Final    NO GROWTH 5 DAYS Performed at [facility], [place] [address], [place], [place] [address]    Report Status 02/19/2018 FINAL  Final  Culture, blood (Routine X 2) w Reflex to ID Panel      Status: None (Preliminary result)   Collection Time: 02/20/18 10:10 PM  Result Value Ref Range Status   Specimen Description BLOOD BLOOD RIGHT WRIST  Final   Special Requests   Final    BOTTLES DRAWN AEROBIC AND ANAEROBIC Blood Culture adequate volume   Culture   Final  NO GROWTH 2 DAYS Performed at [facility], [place] [address], [place], [place] [address]    Report Status PENDING  Incomplete  Culture, blood (Routine X 2) w Reflex to ID Panel     Status: None (Preliminary result)   Collection Time: 02/20/18 10:30 PM  Result Value Ref Range Status   Specimen Description BLOOD LEFT ANTECUBITAL  Final   Special Requests   Final    BOTTLES DRAWN AEROBIC AND ANAEROBIC Blood Culture adequate volume   Culture   Final    NO GROWTH 2 DAYS Performed at [facility], [place] [address] [place], [place] [address]    Report Status PENDING  Incomplete  Urine Culture     Status: None   Collection Time: 02/21/18  2:22 AM  Result Value Ref Range Status   Specimen Description URINE, RANDOM  Final   Special Requests NONE  Final   Culture   Final    NO GROWTH Performed at [facility], [address] [place], [place] [address]    Report Status 02/22/2018 FINAL  Final    RADIOLOGY STUDIES/RESULTS: Dg Chest 2 View  Result Date: 02/20/2018 CLINICAL DATA:  Sudden onset sharp chest pain, short of breath EXAM: CHEST - 2 VIEW COMPARISON:  Chest radiograph 04/01/17 FINDINGS: Sternotomy wires overlie normal cardiac silhouette. There is elevation of the RIGHT hemidiaphragm which is similar to the recent CT and new from chest x-ray of 04/01/2017. RIGHT lower lobe consolidations similar to comparison CT. LEFT lung clear. IMPRESSION: Persistent RIGHT lower lobe consolidation. Recommend follow-up CT with contrast to evaluate after appropriate therapy (3-4 weeks). Persistent elevation of the RIGHT hemidiaphragm new from radiograph 04/01/2017 Electronically Signed   By: Suzy Bouchard M.D.   On:  02/20/2018 14:34   Dg Shoulder Right  Result Date: 02/15/2018 CLINICAL DATA:  Acute RIGHT shoulder pain for 4 days. No known injury. Initial encounter. EXAM: RIGHT SHOULDER - 2+ VIEW COMPARISON:  None. FINDINGS: There is no evidence of fracture or dislocation. There is no evidence of arthropathy or other focal bone abnormality. Soft tissues are unremarkable. IMPRESSION: Negative. Electronically Signed   By: Margarette Canada M.D.   On: 02/15/2018 17:53   Ct Angio Chest Pe W And/or Wo Contrast  Result Date: 02/20/2018 CLINICAL DATA:  LEFT-sided chest pain radiating to RIGHT-sided chest, starting today. PE suspected, intermediate probability, positive D-dimer. EXAM: CT ANGIOGRAPHY CHEST WITH CONTRAST TECHNIQUE: Multidetector CT imaging of the chest was performed using the standard protocol during bolus administration of intravenous contrast. Multiplanar CT image reconstructions and MIPs were obtained to evaluate the vascular anatomy. CONTRAST:  32mL ISOVUE-370 IOPAMIDOL (ISOVUE-370) INJECTION 76% COMPARISON:  Chest CT angiogram dated 02/13/2018. Chest CT angiogram dated 04/02/2017. FINDINGS: Cardiovascular: Evaluation of the peripheral segmental and subsegmental pulmonary artery branches to the LEFT lower lobe is limited due to patient breathing motion artifact, however, there is no pulmonary embolism identified within the main, lobar or central segmental pulmonary arteries to the LEFT lung. There is no pulmonary embolism identified within the main, lobar or segmental pulmonary arteries to the RIGHT lung. Heart size is within normal limits. No pericardial effusion. No thoracic aortic aneurysm. Scattered mild aortic atherosclerosis. Status post median sternotomy for presumed CABG. Mediastinum/Nodes: No mass or enlarged lymph nodes seen within the mediastinum or perihilar regions. Esophagus appears normal. Trachea and central bronchi are unremarkable. Lungs/Pleura: Persistent dense consolidation within the RIGHT lower  lobe, posterior aspect, not appreciably changed compared to the most recent chest  CT angiogram of 02/13/2018, new compared to the earlier chest CT of 04/02/2017. No new lung abnormality.  No pleural effusion or pneumothorax. Upper Abdomen: Limited images of the upper abdomen are unremarkable. Musculoskeletal: Mild degenerative spondylosis throughout the mid and lower thoracic spine. No acute or suspicious osseous finding. Review of the MIP images confirms the above findings. IMPRESSION: 1. No pulmonary embolism seen, with mild study limitations detailed above. 2. Persistent dense consolidation within the RIGHT lower lobe, stable, most likely pneumonia. Consider follow-up chest CT in 4-6 weeks to ensure resolution. Aortic Atherosclerosis (ICD10-I70.0). Electronically Signed   By: Franki Cabot M.D.   On: 02/20/2018 18:43   Ct Angio Chest Pe W And/or Wo Contrast  Result Date: 02/13/2018 CLINICAL DATA:  67 year old male with shortness of breath. EXAM: CT ANGIOGRAPHY CHEST WITH CONTRAST TECHNIQUE: Multidetector CT imaging of the chest was performed using the standard protocol during bolus administration of intravenous contrast. Multiplanar CT image reconstructions and MIPs were obtained to evaluate the vascular anatomy. CONTRAST:  174mL ISOVUE-370 IOPAMIDOL (ISOVUE-370) INJECTION 76% COMPARISON:  Chest radiograph dated 04/01/2017 and CT dated 04/02/2017 FINDINGS: Cardiovascular: There is no cardiomegaly or pericardial effusion. Multi vessel coronary vascular calcification and postsurgical changes of CABG. Mild anterior mediastinal/retrosternal stranding related to recent surgery. No fluid collection. There is mild atherosclerotic calcification of the thoracic aorta. No aneurysmal dilatation or dissection. Evaluation of the pulmonary arteries is limited due to respiratory motion artifact. No pulmonary artery embolus identified. Mediastinum/Nodes: There is no hilar or mediastinal adenopathy. Esophagus and the thyroid  gland are grossly unremarkable. No mediastinal fluid collection. Lungs/Pleura: There is an area of consolidative change with air bronchogram in the right lower lobe. Focal area of hazy and ground-glass density in the medial basal segment of the left lower lobe noted which may represent atelectasis/scarring versus infiltrate although an area of infarct is not excluded. There is no pleural effusion or pneumothorax. The central airways are patent. Upper Abdomen: No acute abnormality. Musculoskeletal: Median sternotomy wires. Degenerative changes of the spine. No acute osseous pathology Review of the MIP images confirms the above findings. IMPRESSION: 1. No CT evidence of pulmonary embolism. 2. Consolidative changes of the right lower lobe most consistent with pneumonia. Clinical correlation and follow-up to resolution recommended. Smaller focal ground-glass density at the left lung base may also represent infection. 3. Multi vessel coronary vascular calcification and postsurgical changes of CABG. No mediastinal fluid collection. Electronically Signed   By: Anner Crete M.D.   On: 02/13/2018 23:28   Ct Pelvis Wo Contrast  Result Date: 02/22/2018 CLINICAL DATA:  Follow-up epididymitis orchitis. Assess for free air. EXAM: CT PELVIS WITHOUT CONTRAST TECHNIQUE: Multidetector CT imaging of the pelvis was performed following the standard protocol without intravenous contrast. COMPARISON:  Scrotal ultrasound Feb 20, 2018 and CT abdomen and pelvis September 21, 2015 FINDINGS: Urinary Tract:  Well distended, normal. Bowel: Moderate amount of retained large bowel stool. Included small and large bowel are normal in course and caliber. Vascular/Lymphatic: Included aortoiliac vessels are normal in course and caliber, mild calcific atherosclerosis Reproductive:  Normal. Other: Partially imaged thickened scrotum. Please note, scrotum has not been fully imaged. Partially imaged perineum is unremarkable. Small amount of free  fluid in the LEFT inguinal canal. No intraperitoneal free fluid or free air in the pelvis. Small fat containing umbilical hernia. Phleboliths in the pelvis. Musculoskeletal: Nonacute. Status post L5-S1 PLIF with arthrodesis, intact laminar hooks and cerclage wires. Severe canal stenosis L4-5, severe L4-5 neural foraminal narrowing. IMPRESSION: 1. Scrotum  and perineum incompletely imaged. No subcutaneous emphysema with included pelvis. 2. L5-S1 PLIF, adjacent segment disease with severe L4-5 canal stenosis and severe L4-5 neural foraminal narrowing. Aortic Atherosclerosis (ICD10-I70.0). Electronically Signed   By: Elon Alas M.D.   On: 02/22/2018 16:02   US Scrotum  Result Date: 02/20/2018 CLINICAL DATA:  67 year old male with acute LEFT testicular pain for 3 days. EXAM: SCROTAL ULTRASOUND DOPPLER ULTRASOUND OF THE TESTICLES TECHNIQUE: Complete ultrasound examination of the testicles, epididymis, and other scrotal structures was performed. Color and spectral Doppler ultrasound were also utilized to evaluate blood flow to the testicles. COMPARISON:  None. FINDINGS: Right testicle Not identified within the scrotal sac or RIGHT inguinal region. Left testicle Measurements: 3.2 x 2.1 x 2.4 cm with increased vascularity. No mass or microlithiasis visualized. Right epididymis:  Not visualized Left epididymis:  Enlarged and hypervascular. Hydrocele:  Small LEFT hydrocele noted. Varicocele:  Mild LEFT varicocele noted Pulsed Doppler interrogation of the LEFT testicle demonstrates normal low resistance arterial and venous waveforms. IMPRESSION: 1. LEFT epididymo-orchitis with small LEFT hydrocele. No abscess or testicular torsion. 2. RIGHT testicle not identified within the scrotal sac or RIGHT inguinal region. Correlate with history. Electronically Signed   By: Margarette Canada M.D.   On: 02/20/2018 19:10   US Scrotum Doppler  Result Date: 02/20/2018 CLINICAL DATA:  67 year old male with acute LEFT testicular pain  for 3 days. EXAM: SCROTAL ULTRASOUND DOPPLER ULTRASOUND OF THE TESTICLES TECHNIQUE: Complete ultrasound examination of the testicles, epididymis, and other scrotal structures was performed. Color and spectral Doppler ultrasound were also utilized to evaluate blood flow to the testicles. COMPARISON:  None. FINDINGS: Right testicle Not identified within the scrotal sac or RIGHT inguinal region. Left testicle Measurements: 3.2 x 2.1 x 2.4 cm with increased vascularity. No mass or microlithiasis visualized. Right epididymis:  Not visualized Left epididymis:  Enlarged and hypervascular. Hydrocele:  Small LEFT hydrocele noted. Varicocele:  Mild LEFT varicocele noted Pulsed Doppler interrogation of the LEFT testicle demonstrates normal low resistance arterial and venous waveforms. IMPRESSION: 1. LEFT epididymo-orchitis with small LEFT hydrocele. No abscess or testicular torsion. 2. RIGHT testicle not identified within the scrotal sac or RIGHT inguinal region. Correlate with history. Electronically Signed   By: Margarette Canada M.D.   On: 02/20/2018 19:10   US Scrotum W/doppler  Result Date: 02/22/2018 CLINICAL DATA:  Left-sided pelvic pain EXAM: SCROTAL ULTRASOUND DOPPLER ULTRASOUND OF THE TESTICLES TECHNIQUE: Complete ultrasound examination of the testicles, epididymis, and other scrotal structures was performed. Color and spectral Doppler ultrasound were also utilized to evaluate blood flow to the testicles. COMPARISON:  Two days ago FINDINGS: Right testicle Surgically absent, reportedly due to trauma in the 1980s Left testicle Measurements: 32 x 24 x 25 mm. Hypervascularity. No collection or heterogeneity. Right epididymis:  Surgically absent Left epididymis: Masslike thickening with hypervascularity that has progressed from prior. There are multiple echogenic foci with possible mild shadowing, increased from prior. Hydrocele:  Negative for  pyocele Varicocele:  Not assessed in this setting Pulsed Doppler interrogation  of the left testicle demonstrates normal low resistance arterial and venous waveforms. These results were called by telephone at the time of interpretation on 02/22/2018 at 1:41 pm to Dr. Oren Binet. IMPRESSION: 1. Left epididymo-orchitis with worsening swelling from 2 days ago. There are multiple echogenic foci within the thickened epididymis, emphysematous epididymitis is a consideration in this immunocompromised diabetic. Noncontrast pelvis CT could further evaluate. 2. No abscess or pyocele. 3. Remote right orchiectomy. Electronically Signed   By: Angelica Chessman  Watts M.D.   On: 02/22/2018 13:41     LOS: 2 days   Oren Binet, MD  [facility]  If 7PM-7AM, please contact night-coverage  Please page via [email or web address]-Password [email or web address]-click on MD name and type text message  02/23/2018, 10:35 AM

## 2018-02-23 NOTE — Progress Notes (Signed)
Inpatient Diabetes Program Recommendations  AACE/ADA: New Consensus Statement on Inpatient Glycemic Control (2015)  Target Ranges:  Prepandial:   less than 140 mg/dL      Peak postprandial:   less than 180 mg/dL (1-2 hours)      Critically ill patients:  140 - 180 mg/dL   Lab Results  Component Value Date   GLUCAP 213 (H) 02/23/2018   HGBA1C 10.6 12/26/2016    Review of Glycemic Control Results for Michael [name], Michael [name] (MRN [id number]) as of 02/23/2018 13:31  Ref. Range 02/22/2018 21:50 02/23/2018 07:42 02/23/2018 11:28  Glucose-Capillary Latest Ref Range: 65 - 99 mg/dL 191 (H) 176 (H) 213 (H)   Diabetes history: Type 2 DM Outpatient Diabetes medications: Lantus 30 units QHS, Humalog 4 units TID, Metformin 1000 mg BID Current orders for Inpatient glycemic control: Lantus 20 units QHS, Novolog 0-9 units TID  Inpatient Diabetes Program Recommendations:    Consider adding meal coverage: Novolog 3 units TID (assuming patient is consuming >50%).   Thanks, Bronson Curb, MSN, RNC-OB Diabetes Coordinator [phone] (8a-5p)

## 2018-02-23 NOTE — Progress Notes (Signed)
Inpatient Diabetes Program   AACE/ADA: New Consensus Statement on Inpatient Glycemic Control (2015)  Target Ranges:  Prepandial:   less than 140 mg/dL      Peak postprandial:   less than 180 mg/dL (1-2 hours)      Critically ill patients:  140 - 180 mg/dL   Spoke with patient about diabetes and home regimen for diabetes control. Patient reports that he has been in rehab the last few weeks and is planning on returning for a short time until he can get his strength back up.  Spoke with patient about his last A1c on file in [place] was 9.5% in 06/2017. Noted patient's Hgb is lower. A1c would not be accurate if obtained this admission.   Discussed outpatient regimen for DM management. Patient reports he is able to get all supplies and medications after he is d/c'd from rehab. Discussed glucose and A1C goals. Discussed importance of checking CBGs and maintaining good CBG control to prevent long-term and short-term complications. Patient to follow up with his PCP for management. Patient verbalized understanding of information discussed and he states that he has no further questions at this time related to diabetes.   Thanks,  Tama Headings RN, MSN, [facility] Inpatient Diabetes Coordinator Team Pager [phone] (8a-5p)

## 2018-02-23 NOTE — Plan of Care (Signed)
  Problem: Activity: Goal: Risk for activity intolerance will decrease Outcome: Progressing   Problem: Safety: Goal: Ability to remain free from injury will improve Outcome: Progressing   Problem: Skin Integrity: Goal: Risk for impaired skin integrity will decrease Outcome: Progressing   

## 2018-02-24 ENCOUNTER — Inpatient Hospital Stay (HOSPITAL_COMMUNITY): Payer: Medicare Other

## 2018-02-24 ENCOUNTER — Other Ambulatory Visit (INDEPENDENT_AMBULATORY_CARE_PROVIDER_SITE_OTHER): Payer: Self-pay | Admitting: Physician Assistant

## 2018-02-24 ENCOUNTER — Other Ambulatory Visit: Payer: Self-pay | Admitting: Infectious Diseases

## 2018-02-24 DIAGNOSIS — J45909 Unspecified asthma, uncomplicated: Secondary | ICD-10-CM

## 2018-02-24 DIAGNOSIS — E119 Type 2 diabetes mellitus without complications: Secondary | ICD-10-CM

## 2018-02-24 DIAGNOSIS — Z833 Family history of diabetes mellitus: Secondary | ICD-10-CM

## 2018-02-24 DIAGNOSIS — Z8701 Personal history of pneumonia (recurrent): Secondary | ICD-10-CM

## 2018-02-24 DIAGNOSIS — E1122 Type 2 diabetes mellitus with diabetic chronic kidney disease: Secondary | ICD-10-CM

## 2018-02-24 DIAGNOSIS — I4891 Unspecified atrial fibrillation: Secondary | ICD-10-CM

## 2018-02-24 DIAGNOSIS — N453 Epididymo-orchitis: Secondary | ICD-10-CM

## 2018-02-24 DIAGNOSIS — F1410 Cocaine abuse, uncomplicated: Secondary | ICD-10-CM

## 2018-02-24 DIAGNOSIS — Z951 Presence of aortocoronary bypass graft: Secondary | ICD-10-CM

## 2018-02-24 DIAGNOSIS — I319 Disease of pericardium, unspecified: Secondary | ICD-10-CM

## 2018-02-24 DIAGNOSIS — N183 Chronic kidney disease, stage 3 (moderate): Secondary | ICD-10-CM

## 2018-02-24 DIAGNOSIS — Z9079 Acquired absence of other genital organ(s): Secondary | ICD-10-CM

## 2018-02-24 LAB — BASIC METABOLIC PANEL
Anion gap: 9 (ref 5–15)
BUN: 28 mg/dL — AB (ref 6–20)
CALCIUM: 8.9 mg/dL (ref 8.9–10.3)
CO2: 23 mmol/L (ref 22–32)
CREATININE: 1.72 mg/dL — AB (ref 0.61–1.24)
Chloride: 102 mmol/L (ref 101–111)
GFR, EST AFRICAN AMERICAN: 46 mL/min — AB (ref >60)
GFR, EST NON AFRICAN AMERICAN: 39 mL/min — AB (ref >60)
Glucose, Bld: 162 mg/dL — ABNORMAL HIGH (ref 65–99)
Potassium: 3.9 mmol/L (ref 3.5–5.1)
SODIUM: 134 mmol/L — AB (ref 135–145)

## 2018-02-24 LAB — GLUCOSE, CAPILLARY
GLUCOSE-CAPILLARY: 179 mg/dL — AB (ref 65–99)
GLUCOSE-CAPILLARY: 233 mg/dL — AB (ref 65–99)
GLUCOSE-CAPILLARY: 256 mg/dL — AB (ref 65–99)
Glucose-Capillary: 145 mg/dL — ABNORMAL HIGH (ref 65–99)
Glucose-Capillary: 170 mg/dL — ABNORMAL HIGH (ref 65–99)
Glucose-Capillary: 195 mg/dL — ABNORMAL HIGH (ref 65–99)
Glucose-Capillary: 221 mg/dL — ABNORMAL HIGH (ref 65–99)

## 2018-02-24 LAB — CBC
HCT: 29 % — ABNORMAL LOW (ref 39.0–52.0)
Hemoglobin: 9.2 g/dL — ABNORMAL LOW (ref 13.0–17.0)
MCH: 25.6 pg — ABNORMAL LOW (ref 26.0–34.0)
MCHC: 31.7 g/dL (ref 30.0–36.0)
MCV: 80.8 fL (ref 78.0–100.0)
Platelets: 195 10*3/uL (ref 150–400)
RBC: 3.59 MIL/uL — ABNORMAL LOW (ref 4.22–5.81)
RDW: 13.2 % (ref 11.5–15.5)
WBC: 5.1 10*3/uL (ref 4.0–10.5)

## 2018-02-24 MED ORDER — PIPERACILLIN-TAZOBACTAM IN DEX 2-0.25 GM/50ML IV SOLN: 2.2500 g | Freq: Four times a day (QID) | INTRAVENOUS | Status: DC

## 2018-02-24 MED ORDER — PIPERACILLIN-TAZOBACTAM 3.375 G IVPB
3.3750 g | Freq: Three times a day (TID) | INTRAVENOUS | Status: DC
  Administered 2018-02-24 – 2018-02-27 (×7): 3.375 g via INTRAVENOUS
  Filled 2018-02-24 (×9): qty 50

## 2018-02-24 NOTE — NC FL2 (Signed)
[place] LEVEL OF CARE SCREENING TOOL     IDENTIFICATION  Patient Name: Michael [name] Birthdate: 1951-09-12 Sex: male Admission Date (Current Location): 02/20/2018  [facility] and [place] Number:  Herbalist and Address:  [facility] [place], [place] [address], [place], [place] [address]      Provider Number: [id number]  Attending Physician Name and Address:  Jonetta Osgood, MD  Relative Name and Phone Number:       Current Level of Care: Hospital Recommended Level of Care: [place] Prior Approval Number:    Date Approved/Denied:   PASRR Number: [id number] E. Expires 5/30.   Discharge Plan: SNF    Current Diagnoses: Patient Active Problem List   Diagnosis Date Noted  . Depression 02/20/2018  . Epididymo-orchitis, acute 02/20/2018  . Essential hypertension 02/20/2018  . Chest pain 02/20/2018  . AKI (acute kidney injury) ([place]) 02/14/2018  . HCAP (healthcare-associated pneumonia) 02/14/2018  . Hepatitis C 12/30/2016  . Back pain 04/18/2016  . S/P carotid endarterectomy 11/15/2015  . MDD (major depressive disorder), recurrent severe, without psychosis ([place]) 09/09/2015  . Cocaine-induced mood disorder ([place]) 08/14/2015  . Cocaine abuse with cocaine-induced mood disorder ([place]) 08/14/2015  . Gout 07/10/2015  . CKD (chronic kidney disease) stage 3, GFR 30-59 ml/min ([name]) 03/06/2015  . Normocytic anemia 03/06/2015  . Hypoglycemia   . Encounter for general adult medical examination with abnormal findings 02/09/2015  . Cocaine use disorder, severe, dependence ([place]) 12/13/2014  . Substance or medication-induced depressive disorder with onset during withdrawal ([place]) 12/13/2014  . Severe recurrent major depressive disorder with psychotic features ([place]) 12/12/2014  . Cervicalgia 06/28/2014  . Lumbar radiculopathy, chronic 06/28/2014  . Asthma, chronic 02/03/2014  . 3-vessel CAD 06/24/2013  . ED (erectile dysfunction)  of organic origin 07/07/2012  . Hypertension goal BP (blood pressure) < 140/80 04/29/2012  . Chondromalacia of left knee 03/19/2012  . Hyperlipidemia with target LDL less than 100 02/12/2012  . Fibromyalgia 02/12/2012  . HIV (human immunodeficiency virus infection) ([place]) 08/27/2011  . Uncontrolled type 2 diabetes with neuropathy ([place]) 10/17/2000    Orientation RESPIRATION BLADDER Height & Weight     Self, Time, Place, Situation  Normal Continent Weight: 170 lb (77.1 kg)(scale c) Height:  5\' 7"  (170.2 cm)  BEHAVIORAL SYMPTOMS/MOOD NEUROLOGICAL BOWEL NUTRITION STATUS  (None) (None) Continent Diet(Heart healthy/carb modified)  AMBULATORY STATUS COMMUNICATION OF NEEDS Skin   Extensive Assist Verbally Surgical wounds                       Personal Care Assistance Level of Assistance  Bathing, Feeding, Dressing Bathing Assistance: Limited assistance Feeding assistance: Independent Dressing Assistance: Limited assistance     Functional Limitations Info  Sight, Hearing, Speech Sight Info: Adequate Hearing Info: Adequate Speech Info: Adequate    SPECIAL CARE FACTORS FREQUENCY  PT (By licensed PT), OT (By licensed OT), Blood pressure     PT Frequency: 5 x week OT Frequency: 5 x week            Contractures Contractures Info: Not present    Additional Factors Info  Code Status, Allergies, Psychotropic Code Status Info: Full Allergies Info: NKDA Psychotropic Info: Depression: Zoloft 50 mg PO daily, Xanax 0.25 mg PO BID prn, Hydroxyzine 25 mg PO TID prn, Trazodone 50 mg PO QHS prn.         Current Medications (02/24/2018):  This is the current hospital active medication list Current Facility-Administered Medications  Medication Dose Route Frequency Provider Last Rate Last Dose  . acetaminophen (TYLENOL) tablet 650 mg  650 mg Oral Q4H PRN [name] Costa, MD      . albuterol (PROVENTIL) (2.5 MG/3ML) 0.083% nebulizer solution 2.5 mg  2.5 mg Nebulization Q4H PRN [name] Costa,  MD      . allopurinol (ZYLOPRIM) tablet 300 mg  300 mg Oral Daily [name] Costa, MD   300 mg at 02/23/18 0830  . ALPRAZolam Duanne Moron) tablet 0.25 mg  0.25 mg Oral BID PRN [name] Costa, MD   0.25 mg at 02/23/18 2149  . amantadine (SYMMETREL) capsule 100 mg  100 mg Oral Daily [name] Costa, MD   100 mg at 02/23/18 0829  . aspirin chewable tablet 81 mg  81 mg Oral Daily [name] Costa, MD   81 mg at 02/23/18 0830  . ceFEPIme (MAXIPIME) 1 g in sodium chloride 0.9 % 100 mL IVPB  1 g Intravenous Q24H Durwin Nora, [place]   Stopped at 02/23/18 1538  . colchicine tablet 0.6 mg  0.6 mg Oral Daily [name] Costa, MD   0.6 mg at 02/23/18 0831  . dextromethorphan-guaiFENesin (MUCINEX DM) 30-600 MG per 12 hr tablet 1 tablet  1 tablet Oral BID PRN [name] Costa, MD      . diltiazem (CARDIZEM CD) 24 hr capsule 120 mg  120 mg Oral Daily [name] Costa, MD   120 mg at 02/23/18 0830  . doxycycline (VIBRAMYCIN) 100 mg in sodium chloride 0.9 % 250 mL IVPB  100 mg Intravenous Q12H Jonetta Osgood, MD   Stopped at 02/24/18 [phone]  . emtricitabine-rilpivir-tenofovir AF (ODEFSEY) 200-25-25 MG per tablet 1 tablet  1 tablet Oral Q breakfast [name] Costa, MD   1 tablet at 02/24/18 0854  . enoxaparin (LOVENOX) injection 40 mg  40 mg Subcutaneous Q24H [name] Costa, MD   40 mg at 02/23/18 2150  . famotidine (PEPCID) tablet 20 mg  20 mg Oral Daily [name] Costa, MD   20 mg at 02/23/18 0830  . gabapentin (NEURONTIN) capsule 400 mg  400 mg Oral BID [name] Costa, MD   400 mg at 02/23/18 2149  . hydrALAZINE (APRESOLINE) injection 5 mg  5 mg Intravenous Q2H PRN [name] Costa, MD      . hydrOXYzine (ATARAX/VISTARIL) tablet 25 mg  25 mg Oral TID PRN [name] Costa, MD      . insulin aspart (novoLOG) injection 0-9 Units  0-9 Units Subcutaneous TID WC [name] Costa, MD   1 Units at 02/24/18 [phone]  . insulin glargine (LANTUS) injection 20 Units  20 Units Subcutaneous QHS [name] Costa, MD   20 Units at 02/23/18 2150  . isosorbide mononitrate (IMDUR) 24 hr tablet 30 mg  30 mg Oral  Daily [name] Costa, MD   30 mg at 02/23/18 0831  . morphine 4 MG/ML injection 2 mg  2 mg Intravenous Q3H PRN [name] Costa, MD   2 mg at 02/23/18 1506  . nitroGLYCERIN (NITROSTAT) SL tablet 0.4 mg  0.4 mg Sublingual Q5 min PRN [name] Costa, MD      . ondansetron [facility]) injection 4 mg  4 mg Intravenous Q6H PRN [name] Costa, MD      . oxyCODONE (Oxy IR/ROXICODONE) immediate release tablet 5 mg  5 mg Oral Q6H PRN Jonetta Osgood, MD   5 mg at 02/23/18 2149  . polyethylene glycol (MIRALAX / GLYCOLAX) packet 17 g  17 g Oral Daily Jonetta Osgood, MD   17 g at 02/23/18 0829  .  pravastatin (PRAVACHOL) tablet 40 mg  40 mg Oral QHS [name] Costa, MD   40 mg at 02/23/18 2149  . sertraline (ZOLOFT) tablet 50 mg  50 mg Oral Daily [name] Costa, MD   50 mg at 02/23/18 0830  . traZODone (DESYREL) tablet 50 mg  50 mg Oral QHS PRN [name] Costa, MD      . zolpidem (AMBIEN) tablet 5 mg  5 mg Oral QHS PRN [name] Costa, MD   5 mg at 02/22/18 2258     Discharge Medications: Please see discharge summary for a list of discharge medications.  Relevant Imaging Results:  Relevant Lab Results:   Additional Information SS#: [id number]  Candie Chroman, LCSW

## 2018-02-24 NOTE — Plan of Care (Signed)
  Problem: Activity: Goal: Risk for activity intolerance will decrease Outcome: Progressing   Problem: Safety: Goal: Ability to remain free from injury will improve Outcome: Progressing   

## 2018-02-24 NOTE — Progress Notes (Signed)
Inpatient Diabetes Program Recommendations  AACE/ADA: New Consensus Statement on Inpatient Glycemic Control (2015)  Target Ranges:  Prepandial:   less than 140 mg/dL      Peak postprandial:   less than 180 mg/dL (1-2 hours)      Critically ill patients:  140 - 180 mg/dL   Results for CAMEREN, EARNEST (MRN [id number]) as of 02/24/2018 10:11  Ref. Range 02/23/2018 07:42 02/23/2018 11:28 02/23/2018 21:31 02/24/2018 07:43  Glucose-Capillary Latest Ref Range: 65 - 99 mg/dL 176 (H) 213 (H) 210 (H) 145 (H)   Review of Glycemic Control  Diabetes history: DM2 Outpatient Diabetes medications: Lantus 30 units QHS, Humalog 4 units TID, Metformin 1000 mg BID Current orders for Inpatient glycemic control: Lantus 20 units QHS, Novolog 0-9 units TID   Inpatient Diabetes Program Recommendations: Insulin - Meal Coverage: Please consider ordering Novolog 3 units TID wtih meals for meal coverage if patient eats at least 50% of meals.  Thanks, Barnie Alderman, RN, MSN, CDE Diabetes Coordinator Inpatient Diabetes Program [phone] (Team Pager from 8am to 5pm)

## 2018-02-24 NOTE — Progress Notes (Signed)
Patient ID: Michael [name], male   DOB: 1951/09/11, 67 y.o.   MRN: [id number]    Subjective: Pt with minimal pain improvement.  Still quite tender even to stand.  No fever.  Objective: Vital signs in last 24 hours: Temp:  [98 F (36.7 C)-98.4 F (36.9 C)] 98 F (36.7 C) (05/21 0323) Pulse Rate:  [87-92] 87 (05/21 0323) Resp:  [18] 18 (05/21 0323) BP: (103-119)/(68-79) 119/74 (05/21 0323) SpO2:  [97 %-100 %] 100 % (05/21 0323) Weight:  [77.1 kg (170 lb)] 77.1 kg (170 lb) (05/21 0323)  Intake/Output from previous day: 05/20 0701 - 05/21 0700 In: 1737.5 [P.O.:480; I.V.:557.5; IV Piggyback:700] Out: 1550 [OQHUT:6546] Intake/Output this shift: No intake/output data recorded.  Physical Exam:  General: Alert and oriented GU: Still severely tender on exam.  No fluctuance of scrotum or crepitus.  Minimal edema. No perineal tenderness, erythema, drainage, etc.  Lab Results: Recent Labs    02/22/18 0715 02/23/18 0725 02/24/18 0550  HGB 10.0* 9.5* 9.2*  HCT 31.7* 30.0* 29.0*   CBC Latest Ref Rng & Units 02/24/2018 02/23/2018 02/22/2018  WBC 4.0 - 10.5 K/uL 5.1 8.0 10.5  Hemoglobin 13.0 - 17.0 g/dL 9.2(L) 9.5(L) 10.0(L)  Hematocrit 39.0 - 52.0 % 29.0(L) 30.0(L) 31.7(L)  Platelets 150 - 400 K/uL 195 197 213     BMET Recent Labs    02/22/18 0715 02/23/18 0725  NA 131* 132*  K 4.2 4.0  CL 96* 97*  CO2 23 23  GLUCOSE 105* 182*  BUN 17 26*  CREATININE 1.68* 2.03*  CALCIUM 9.2 9.0     Studies/Results: Ct Pelvis Wo Contrast  Result Date: 02/22/2018 CLINICAL DATA:  Follow-up epididymitis orchitis. Assess for free air. EXAM: CT PELVIS WITHOUT CONTRAST TECHNIQUE: Multidetector CT imaging of the pelvis was performed following the standard protocol without intravenous contrast. COMPARISON:  Scrotal ultrasound Feb 20, 2018 and CT abdomen and pelvis September 21, 2015 FINDINGS: Urinary Tract:  Well distended, normal. Bowel: Moderate amount of retained large bowel stool. Included  small and large bowel are normal in course and caliber. Vascular/Lymphatic: Included aortoiliac vessels are normal in course and caliber, mild calcific atherosclerosis Reproductive:  Normal. Other: Partially imaged thickened scrotum. Please note, scrotum has not been fully imaged. Partially imaged perineum is unremarkable. Small amount of free fluid in the LEFT inguinal canal. No intraperitoneal free fluid or free air in the pelvis. Small fat containing umbilical hernia. Phleboliths in the pelvis. Musculoskeletal: Nonacute. Status post L5-S1 PLIF with arthrodesis, intact laminar hooks and cerclage wires. Severe canal stenosis L4-5, severe L4-5 neural foraminal narrowing. IMPRESSION: 1. Scrotum and perineum incompletely imaged. No subcutaneous emphysema with included pelvis. 2. L5-S1 PLIF, adjacent segment disease with severe L4-5 canal stenosis and severe L4-5 neural foraminal narrowing. Aortic Atherosclerosis (ICD10-I70.0). Electronically Signed   By: Elon Alas M.D.   On: 02/22/2018 16:02   US Scrotum W/doppler  Result Date: 02/22/2018 CLINICAL DATA:  Left-sided pelvic pain EXAM: SCROTAL ULTRASOUND DOPPLER ULTRASOUND OF THE TESTICLES TECHNIQUE: Complete ultrasound examination of the testicles, epididymis, and other scrotal structures was performed. Color and spectral Doppler ultrasound were also utilized to evaluate blood flow to the testicles. COMPARISON:  Two days ago FINDINGS: Right testicle Surgically absent, reportedly due to trauma in the 1980s Left testicle Measurements: 32 x 24 x 25 mm. Hypervascularity. No collection or heterogeneity. Right epididymis:  Surgically absent Left epididymis: Masslike thickening with hypervascularity that has progressed from prior. There are multiple echogenic foci with possible mild shadowing, increased from prior.  Hydrocele:  Negative for  pyocele Varicocele:  Not assessed in this setting Pulsed Doppler interrogation of the left testicle demonstrates normal low  resistance arterial and venous waveforms. These results were called by telephone at the time of interpretation on 02/22/2018 at 1:41 pm to Dr. Oren Binet. IMPRESSION: 1. Left epididymo-orchitis with worsening swelling from 2 days ago. There are multiple echogenic foci within the thickened epididymis, emphysematous epididymitis is a consideration in this immunocompromised diabetic. Noncontrast pelvis CT could further evaluate. 2. No abscess or pyocele. 3. Remote right orchiectomy. Electronically Signed   By: Monte Fantasia M.D.   On: 02/22/2018 13:41    Cultures negative.  Assessment/Plan: Epididymo-orchitis of solitary left testis: Minimal clinical improvement now on broader IV antibiotic therapy (Cefepime).  Will repeat ultrasound to ensure no developing abscess although clinical exam is not concerning.  May benefit from ID input for probable atypical infection.    LOS: 3 days   Sherine Cortese,LES 02/24/2018, 7:56 AM

## 2018-02-24 NOTE — Progress Notes (Addendum)
PROGRESS NOTE        PATIENT DETAILS Name: Michael [name] Age: 67 y.o. Sex: male Date of Birth: Apr 09, 1951 Admit Date: 02/20/2018 Admitting Physician [name] Costa, MD [facility]:QIONG, [name], PA-C  Brief Narrative: Patient is a 67 y.o. male with recent history of CABG approximately 3 weeks back (4/25), HIV, DM-2 presented to the ED for evaluation of pleuritic chest pain and scrotal pain.  Thought to have post pericardiotomy syndrome as the etiology of chest pain, Doppler ultrasound consistent with orchitis/epididymitis.  Subsequently admitted for further evaluation and treatment.  See below for further details  Subjective: Left testicular pain is essentially unchanged-but now has developed pelvic and left groin pain as well.  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  CT angiogram of the chest was negative for PE.  TTE showed preserved EF without any wall motion abnormalities.  No further recommendations from cardiology.    Healthcare associated pneumonia: CT angiogram of the chest shows lower lobe consolidation-continue cefepime which is primary on board for severe orchitis.  Cultures negative so far.  Severe left-sided epididymo-orchitis: Currently continues to have severe left testicular pain-and this morning seems to have developed pelvic and left groin pain as well.  Groin and pelvic area is not inflamed-exam appears benign apart from some mild tenderness.  Patient remains on cefepime and doxycycline.  Urology following-plans are to repeat scrotal ultrasound with Doppler (third this admission) today-have consulted infectious disease.  Urine GC probe negative.  Urine culture remains negative.  CAD status post CABG April 2019: See above regarding chest pain-continue aspirin, statin.  Follow.    Acute kidney injury chronic kidney disease stage III: Creatinine improving with supportive care if and  creatinine close to usual baseline.  Continue to hold diuretics and Cozaar.    Hypertension: Controlled-with Cardizem. Continue to hold HCTZ and Cozaar.   HIV: Continue with antiretrovirals, last CD4 count January 2019 was 630.  Depression: Stable-continue Zoloft  DVT Prophylaxis: Prophylactic Lovenox   Code Status: Full code  Family Communication: None at bedside  Disposition Plan: Remain inpatient-home once orchitis improves  Antimicrobial agents: Anti-infectives (From admission, onward)   Start     Dose/Rate Route Frequency Ordered Stop   02/23/18 1800  doxycycline (VIBRAMYCIN) 100 mg in sodium chloride 0.9 % 250 mL IVPB     100 mg 125 mL/hr over 120 Minutes Intravenous Every 12 hours 02/23/18 1519     02/22/18 1445  ceFEPIme (MAXIPIME) 1 g in sodium chloride 0.9 % 100 mL IVPB     1 g 200 mL/hr over 30 Minutes Intravenous Every 24 hours 02/22/18 1438     02/21/18 2200  levofloxacin (LEVAQUIN) IVPB 500 mg  Status:  Discontinued     500 mg 100 mL/hr over 60 Minutes Intravenous Every 24 hours 02/21/18 1057 02/21/18 1253   02/21/18 2200  Levofloxacin (LEVAQUIN) IVPB 250 mg  Status:  Discontinued     250 mg 50 mL/hr over 60 Minutes Intravenous Every 24 hours 02/21/18 1253 02/23/18 1519   02/21/18 0800  emtricitabine-rilpivir-tenofovir AF (ODEFSEY) 200-25-25 MG per tablet 1 tablet    Note to Pharmacy:  For HIV infection     1 tablet Oral Daily with breakfast 02/20/18 2145     02/20/18 2200  amoxicillin-clavulanate (AUGMENTIN) 875-125 MG per tablet 1 tablet  Status:  Discontinued  1 tablet Oral Every 12 hours 02/20/18 2145 02/21/18 1100   02/20/18 2200  levofloxacin (LEVAQUIN) IVPB 500 mg  Status:  Discontinued     500 mg 100 mL/hr over 60 Minutes Intravenous Every 24 hours 02/20/18 2159 02/21/18 1057   02/20/18 1930  sulfamethoxazole-trimethoprim (BACTRIM DS,SEPTRA DS) 800-160 MG per tablet 1 tablet     1 tablet Oral  Once 02/20/18 1921 02/20/18 1930       Procedures: None  CONSULTS:  cardiology  Urology Infectious disease  Time spent: 33 minutes-Greater than 50% of this time was spent in counseling, explanation of diagnosis, planning of further management, and coordination of care.  MEDICATIONS: Scheduled Meds: . allopurinol  300 mg Oral Daily  . amantadine  100 mg Oral Daily  . aspirin  81 mg Oral Daily  . colchicine  0.6 mg Oral Daily  . diltiazem  120 mg Oral Daily  . emtricitabine-rilpivir-tenofovir AF  1 tablet Oral Q breakfast  . enoxaparin (LOVENOX) injection  40 mg Subcutaneous Q24H  . famotidine  20 mg Oral Daily  . gabapentin  400 mg Oral BID  . insulin aspart  0-9 Units Subcutaneous TID WC  . insulin glargine  20 Units Subcutaneous QHS  . isosorbide mononitrate  30 mg Oral Daily  . polyethylene glycol  17 g Oral Daily  . pravastatin  40 mg Oral QHS  . sertraline  50 mg Oral Daily   Continuous Infusions: . ceFEPime (MAXIPIME) IV Stopped (02/23/18 1538)  . doxycycline (VIBRAMYCIN) IV Stopped (02/24/18 0853)   PRN Meds:.acetaminophen, albuterol, ALPRAZolam, dextromethorphan-guaiFENesin, hydrALAZINE, hydrOXYzine, morphine injection, nitroGLYCERIN, ondansetron (ZOFRAN) IV, oxyCODONE, traZODone, zolpidem   PHYSICAL EXAM: Vital signs: Vitals:   02/24/18 0821 02/24/18 0832 02/24/18 1123 02/24/18 1143  BP: 122/78 (!) 128/91 (!) 143/93 (!) 127/93  Pulse: 90 91 91 91  Resp: 16 (!) 22 20 20   Temp: 98.4 F (36.9 C) 97.9 F (36.6 C) 98.1 F (36.7 C) 99 F (37.2 C)  TempSrc: Oral Oral Oral Oral  SpO2: 99% 100% 100% 100%  Weight:      Height:       Filed Weights   02/22/18 0319 02/23/18 0518 02/24/18 0323  Weight: 76.7 kg (169 lb 3.2 oz) 75.6 kg (166 lb 9.6 oz) 77.1 kg (170 lb)   Body mass index is 26.63 kg/m.   General appearance:Awake, alert, not in any distress.  Eyes:no scleral icterus. HEENT: Atraumatic and Normocephalic Neck: supple, no JVD. Resp:Good air entry bilaterally,no rales or  rhonchi CVS: S1 S2 regular, no murmurs.  GI: Bowel sounds present, Non tender and not distended with no gaurding, rigidity or rebound.left testicular area still very tender-mildly tender in the adjoining groin and pelvic area. Extremities: B/L Lower Ext shows no edema, both legs are warm to touch. Neurology:  Non focal Psychiatric: Normal judgment and insight. Normal mood. Musculoskeletal:No digital cyanosis Skin:No Rash, warm and dry Wounds:N/A  I have personally reviewed following labs and imaging studies  LABORATORY DATA: CBC: Recent Labs  Lab 02/20/18 1318 02/22/18 0715 02/23/18 0725 02/24/18 0550  WBC 8.2 10.5 8.0 5.1  HGB 10.0* 10.0* 9.5* 9.2*  HCT 31.1* 31.7* 30.0* 29.0*  MCV 82.3 80.9 80.6 80.8  PLT 259 213 197 809    Basic Metabolic Panel: Recent Labs  Lab 02/20/18 1318 02/22/18 0715 02/23/18 0725 02/24/18 0550  NA 137 131* 132* 134*  K 4.5 4.2 4.0 3.9  CL 102 96* 97* 102  CO2 24 23 23 23   GLUCOSE 152* 105* 182*  162*  BUN 23* 17 26* 28*  CREATININE 1.78* 1.68* 2.03* 1.72*  CALCIUM 9.3 9.2 9.0 8.9    GFR: Estimated Creatinine Clearance: 39 mL/min (A) (by C-G formula based on SCr of 1.72 mg/dL (H)).  Liver Function Tests: No results for input(s): AST, ALT, ALKPHOS, BILITOT, PROT, ALBUMIN in the last 168 hours. No results for input(s): LIPASE, AMYLASE in the last 168 hours. No results for input(s): AMMONIA in the last 168 hours.  Coagulation Profile: No results for input(s): INR, PROTIME in the last 168 hours.  Cardiac Enzymes: Recent Labs  Lab 02/21/18 0350 02/21/18 0928  TROPONINI 0.09* 0.10*    BNP (last 3 results) No results for input(s): PROBNP in the last 8760 hours.  HbA1C: No results for input(s): HGBA1C in the last 72 hours.  CBG: Recent Labs  Lab 02/23/18 1128 02/23/18 1633 02/23/18 2131 02/24/18 0743 02/24/18 1148  GLUCAP 213* 179* 210* 145* 256*    Lipid Profile: No results for input(s): CHOL, HDL, LDLCALC, TRIG,  CHOLHDL, LDLDIRECT in the last 72 hours.  Thyroid Function Tests: No results for input(s): TSH, T4TOTAL, FREET4, T3FREE, THYROIDAB in the last 72 hours.  Anemia Panel: No results for input(s): VITAMINB12, FOLATE, FERRITIN, TIBC, IRON, RETICCTPCT in the last 72 hours.  Urine analysis:    Component Value Date/Time   COLORURINE YELLOW 02/20/2018 1733   APPEARANCEUR HAZY (A) 02/20/2018 1733   LABSPEC 1.023 02/20/2018 1733   PHURINE 6.0 02/20/2018 1733   GLUCOSEU 50 (A) 02/20/2018 1733   HGBUR NEGATIVE 02/20/2018 1733   BILIRUBINUR NEGATIVE 02/20/2018 1733   KETONESUR NEGATIVE 02/20/2018 1733   PROTEINUR 100 (A) 02/20/2018 1733   UROBILINOGEN 0.2 08/13/2015 1030   NITRITE NEGATIVE 02/20/2018 1733   LEUKOCYTESUR SMALL (A) 02/20/2018 1733    Sepsis Labs: Lactic Acid, Venous    Component Value Date/Time   LATICACIDVEN 2.25 (HH) 03/06/2015 1810    MICROBIOLOGY: Recent Results (from the past 240 hour(s))  Culture, blood (Routine X 2) w Reflex to ID Panel     Status: None (Preliminary result)   Collection Time: 02/20/18 10:10 PM  Result Value Ref Range Status   Specimen Description BLOOD BLOOD RIGHT WRIST  Final   Special Requests   Final    BOTTLES DRAWN AEROBIC AND ANAEROBIC Blood Culture adequate volume   Culture   Final    NO GROWTH 3 DAYS Performed at [facility], [place] [address] [place], [place] [address]    Report Status PENDING  Incomplete  Culture, blood (Routine X 2) w Reflex to ID Panel     Status: None (Preliminary result)   Collection Time: 02/20/18 10:30 PM  Result Value Ref Range Status   Specimen Description BLOOD LEFT ANTECUBITAL  Final   Special Requests   Final    BOTTLES DRAWN AEROBIC AND ANAEROBIC Blood Culture adequate volume   Culture   Final    NO GROWTH 3 DAYS Performed at [facility], [place] [address], [place], [place] [address]    Report Status PENDING  Incomplete  Urine Culture     Status: None   Collection Time: 02/21/18  2:22 AM   Result Value Ref Range Status   Specimen Description URINE, RANDOM  Final   Special Requests NONE  Final   Culture   Final    NO GROWTH Performed at [facility], [address], [place], [place] [address]    Report Status 02/22/2018 FINAL  Final    RADIOLOGY STUDIES/RESULTS:  Dg Chest 2 View  Result Date: 02/20/2018 CLINICAL DATA:  Sudden onset sharp chest pain, short of breath EXAM: CHEST - 2 VIEW COMPARISON:  Chest radiograph 04/01/17 FINDINGS: Sternotomy wires overlie normal cardiac silhouette. There is elevation of the RIGHT hemidiaphragm which is similar to the recent CT and new from chest x-ray of 04/01/2017. RIGHT lower lobe consolidations similar to comparison CT. LEFT lung clear. IMPRESSION: Persistent RIGHT lower lobe consolidation. Recommend follow-up CT with contrast to evaluate after appropriate therapy (3-4 weeks). Persistent elevation of the RIGHT hemidiaphragm new from radiograph 04/01/2017 Electronically Signed   By: Suzy Bouchard M.D.   On: 02/20/2018 14:34   Dg Shoulder Right  Result Date: 02/15/2018 CLINICAL DATA:  Acute RIGHT shoulder pain for 4 days. No known injury. Initial encounter. EXAM: RIGHT SHOULDER - 2+ VIEW COMPARISON:  None. FINDINGS: There is no evidence of fracture or dislocation. There is no evidence of arthropathy or other focal bone abnormality. Soft tissues are unremarkable. IMPRESSION: Negative. Electronically Signed   By: Margarette Canada M.D.   On: 02/15/2018 17:53   Ct Angio Chest Pe W And/or Wo Contrast  Result Date: 02/20/2018 CLINICAL DATA:  LEFT-sided chest pain radiating to RIGHT-sided chest, starting today. PE suspected, intermediate probability, positive D-dimer. EXAM: CT ANGIOGRAPHY CHEST WITH CONTRAST TECHNIQUE: Multidetector CT imaging of the chest was performed using the standard protocol during bolus administration of intravenous contrast. Multiplanar CT image reconstructions and MIPs were obtained to evaluate the vascular anatomy.  CONTRAST:  57mL ISOVUE-370 IOPAMIDOL (ISOVUE-370) INJECTION 76% COMPARISON:  Chest CT angiogram dated 02/13/2018. Chest CT angiogram dated 04/02/2017. FINDINGS: Cardiovascular: Evaluation of the peripheral segmental and subsegmental pulmonary artery branches to the LEFT lower lobe is limited due to patient breathing motion artifact, however, there is no pulmonary embolism identified within the main, lobar or central segmental pulmonary arteries to the LEFT lung. There is no pulmonary embolism identified within the main, lobar or segmental pulmonary arteries to the RIGHT lung. Heart size is within normal limits. No pericardial effusion. No thoracic aortic aneurysm. Scattered mild aortic atherosclerosis. Status post median sternotomy for presumed CABG. Mediastinum/Nodes: No mass or enlarged lymph nodes seen within the mediastinum or perihilar regions. Esophagus appears normal. Trachea and central bronchi are unremarkable. Lungs/Pleura: Persistent dense consolidation within the RIGHT lower lobe, posterior aspect, not appreciably changed compared to the most recent chest CT angiogram of 02/13/2018, new compared to the earlier chest CT of 04/02/2017. No new lung abnormality.  No pleural effusion or pneumothorax. Upper Abdomen: Limited images of the upper abdomen are unremarkable. Musculoskeletal: Mild degenerative spondylosis throughout the mid and lower thoracic spine. No acute or suspicious osseous finding. Review of the MIP images confirms the above findings. IMPRESSION: 1. No pulmonary embolism seen, with mild study limitations detailed above. 2. Persistent dense consolidation within the RIGHT lower lobe, stable, most likely pneumonia. Consider follow-up chest CT in 4-6 weeks to ensure resolution. Aortic Atherosclerosis (ICD10-I70.0). Electronically Signed   By: Franki Cabot M.D.   On: 02/20/2018 18:43   Ct Angio Chest Pe W And/or Wo Contrast  Result Date: 02/13/2018 CLINICAL DATA:  67 year old male with  shortness of breath. EXAM: CT ANGIOGRAPHY CHEST WITH CONTRAST TECHNIQUE: Multidetector CT imaging of the chest was performed using the standard protocol during bolus administration of intravenous contrast. Multiplanar CT image reconstructions and MIPs were obtained to evaluate the vascular anatomy. CONTRAST:  178mL ISOVUE-370 IOPAMIDOL (ISOVUE-370) INJECTION 76% COMPARISON:  Chest radiograph dated 04/01/2017 and CT dated 04/02/2017 FINDINGS: Cardiovascular: There is no  cardiomegaly or pericardial effusion. Multi vessel coronary vascular calcification and postsurgical changes of CABG. Mild anterior mediastinal/retrosternal stranding related to recent surgery. No fluid collection. There is mild atherosclerotic calcification of the thoracic aorta. No aneurysmal dilatation or dissection. Evaluation of the pulmonary arteries is limited due to respiratory motion artifact. No pulmonary artery embolus identified. Mediastinum/Nodes: There is no hilar or mediastinal adenopathy. Esophagus and the thyroid gland are grossly unremarkable. No mediastinal fluid collection. Lungs/Pleura: There is an area of consolidative change with air bronchogram in the right lower lobe. Focal area of hazy and ground-glass density in the medial basal segment of the left lower lobe noted which may represent atelectasis/scarring versus infiltrate although an area of infarct is not excluded. There is no pleural effusion or pneumothorax. The central airways are patent. Upper Abdomen: No acute abnormality. Musculoskeletal: Median sternotomy wires. Degenerative changes of the spine. No acute osseous pathology Review of the MIP images confirms the above findings. IMPRESSION: 1. No CT evidence of pulmonary embolism. 2. Consolidative changes of the right lower lobe most consistent with pneumonia. Clinical correlation and follow-up to resolution recommended. Smaller focal ground-glass density at the left lung base may also represent infection. 3. Multi  vessel coronary vascular calcification and postsurgical changes of CABG. No mediastinal fluid collection. Electronically Signed   By: Anner Crete M.D.   On: 02/13/2018 23:28   Ct Pelvis Wo Contrast  Result Date: 02/22/2018 CLINICAL DATA:  Follow-up epididymitis orchitis. Assess for free air. EXAM: CT PELVIS WITHOUT CONTRAST TECHNIQUE: Multidetector CT imaging of the pelvis was performed following the standard protocol without intravenous contrast. COMPARISON:  Scrotal ultrasound Feb 20, 2018 and CT abdomen and pelvis September 21, 2015 FINDINGS: Urinary Tract:  Well distended, normal. Bowel: Moderate amount of retained large bowel stool. Included small and large bowel are normal in course and caliber. Vascular/Lymphatic: Included aortoiliac vessels are normal in course and caliber, mild calcific atherosclerosis Reproductive:  Normal. Other: Partially imaged thickened scrotum. Please note, scrotum has not been fully imaged. Partially imaged perineum is unremarkable. Small amount of free fluid in the LEFT inguinal canal. No intraperitoneal free fluid or free air in the pelvis. Small fat containing umbilical hernia. Phleboliths in the pelvis. Musculoskeletal: Nonacute. Status post L5-S1 PLIF with arthrodesis, intact laminar hooks and cerclage wires. Severe canal stenosis L4-5, severe L4-5 neural foraminal narrowing. IMPRESSION: 1. Scrotum and perineum incompletely imaged. No subcutaneous emphysema with included pelvis. 2. L5-S1 PLIF, adjacent segment disease with severe L4-5 canal stenosis and severe L4-5 neural foraminal narrowing. Aortic Atherosclerosis (ICD10-I70.0). Electronically Signed   By: Elon Alas M.D.   On: 02/22/2018 16:02   US Scrotum  Result Date: 02/20/2018 CLINICAL DATA:  67 year old male with acute LEFT testicular pain for 3 days. EXAM: SCROTAL ULTRASOUND DOPPLER ULTRASOUND OF THE TESTICLES TECHNIQUE: Complete ultrasound examination of the testicles, epididymis, and other scrotal  structures was performed. Color and spectral Doppler ultrasound were also utilized to evaluate blood flow to the testicles. COMPARISON:  None. FINDINGS: Right testicle Not identified within the scrotal sac or RIGHT inguinal region. Left testicle Measurements: 3.2 x 2.1 x 2.4 cm with increased vascularity. No mass or microlithiasis visualized. Right epididymis:  Not visualized Left epididymis:  Enlarged and hypervascular. Hydrocele:  Small LEFT hydrocele noted. Varicocele:  Mild LEFT varicocele noted Pulsed Doppler interrogation of the LEFT testicle demonstrates normal low resistance arterial and venous waveforms. IMPRESSION: 1. LEFT epididymo-orchitis with small LEFT hydrocele. No abscess or testicular torsion. 2. RIGHT testicle not identified within the scrotal sac  or RIGHT inguinal region. Correlate with history. Electronically Signed   By: Margarette Canada M.D.   On: 02/20/2018 19:10   US Scrotum Doppler  Result Date: 02/20/2018 CLINICAL DATA:  67 year old male with acute LEFT testicular pain for 3 days. EXAM: SCROTAL ULTRASOUND DOPPLER ULTRASOUND OF THE TESTICLES TECHNIQUE: Complete ultrasound examination of the testicles, epididymis, and other scrotal structures was performed. Color and spectral Doppler ultrasound were also utilized to evaluate blood flow to the testicles. COMPARISON:  None. FINDINGS: Right testicle Not identified within the scrotal sac or RIGHT inguinal region. Left testicle Measurements: 3.2 x 2.1 x 2.4 cm with increased vascularity. No mass or microlithiasis visualized. Right epididymis:  Not visualized Left epididymis:  Enlarged and hypervascular. Hydrocele:  Small LEFT hydrocele noted. Varicocele:  Mild LEFT varicocele noted Pulsed Doppler interrogation of the LEFT testicle demonstrates normal low resistance arterial and venous waveforms. IMPRESSION: 1. LEFT epididymo-orchitis with small LEFT hydrocele. No abscess or testicular torsion. 2. RIGHT testicle not identified within the scrotal  sac or RIGHT inguinal region. Correlate with history. Electronically Signed   By: Margarette Canada M.D.   On: 02/20/2018 19:10   US Scrotum W/doppler  Result Date: 02/22/2018 CLINICAL DATA:  Left-sided pelvic pain EXAM: SCROTAL ULTRASOUND DOPPLER ULTRASOUND OF THE TESTICLES TECHNIQUE: Complete ultrasound examination of the testicles, epididymis, and other scrotal structures was performed. Color and spectral Doppler ultrasound were also utilized to evaluate blood flow to the testicles. COMPARISON:  Two days ago FINDINGS: Right testicle Surgically absent, reportedly due to trauma in the 1980s Left testicle Measurements: 32 x 24 x 25 mm. Hypervascularity. No collection or heterogeneity. Right epididymis:  Surgically absent Left epididymis: Masslike thickening with hypervascularity that has progressed from prior. There are multiple echogenic foci with possible mild shadowing, increased from prior. Hydrocele:  Negative for  pyocele Varicocele:  Not assessed in this setting Pulsed Doppler interrogation of the left testicle demonstrates normal low resistance arterial and venous waveforms. These results were called by telephone at the time of interpretation on 02/22/2018 at 1:41 pm to Dr. Oren Binet. IMPRESSION: 1. Left epididymo-orchitis with worsening swelling from 2 days ago. There are multiple echogenic foci within the thickened epididymis, emphysematous epididymitis is a consideration in this immunocompromised diabetic. Noncontrast pelvis CT could further evaluate. 2. No abscess or pyocele. 3. Remote right orchiectomy. Electronically Signed   By: Monte Fantasia M.D.   On: 02/22/2018 13:41     LOS: 3 days   Oren Binet, MD  [facility]  If 7PM-7AM, please contact night-coverage  Please page via [email or web address]-click on MD name and type text message  02/24/2018, 1:18 PM

## 2018-02-24 NOTE — Plan of Care (Signed)
  Problem: Activity: Goal: Risk for activity intolerance will decrease 02/24/2018 2140 by Barton Dubois, RN Outcome: Progressing 02/24/2018 2139 by Barton Dubois, RN Outcome: Progressing   Problem: Safety: Goal: Ability to remain free from injury will improve 02/24/2018 2140 by Barton Dubois, RN Outcome: Progressing 02/24/2018 2139 by Barton Dubois, RN Outcome: Progressing

## 2018-02-24 NOTE — Progress Notes (Signed)
RN called radiology to find out about order scrotum US, Radiology tech told RN that they were busy with emergencies [place] and will get to the patient as soon as they can.

## 2018-02-24 NOTE — Consult Note (Addendum)
[facility]    Date of Admission:  02/20/2018     Total days of antibiotics: 5  Day 3 Cefepime                Reason for Consult: epididymo-orchitis refractor to antibiotics    Referring Provider: Ghimire Primary Care Provider: [name], PA-C  HIV Provider: [name]   Assessment: 67 y.o. male with well-controlled HIV infection presenting with chest pain and acute onset scrotal pain following recent treatment for HCAP (02/13/18) and CABG x 3 (01/29/18). He was resumed on amox-clav at admission, give 3 days of levofloxacin and is now maintained on cefepime day 3. He continues to have significant pain/tenderness to his lest testicle to the point it is now impacting ability to ambulate and apply pressure to the leg. 5/19 CT scan showed partially thickened scrotum (incomplete imaging) and small free fluid in the left inguinal canal w/o evidence of free air in pelvis. U/S indicating consideration for emphysematous epididymitis considering diabetes and comorbidity of HIV. His leukocytosis has resolved as well as fevers however his exam is still with swelling, erythema, induration and exquisite tenderness to the posterior teste. Would add anaerobic coverage assuming this to be polymicrobial involvement - change cefepime to zosyn. Would also have a low threshold to re-image and discuss with urology pending to see if surgery warranted.  Regarding the infiltrate on x-ray - likely this is residual following recent treatment for pneumonia and not active infection where we can see incomplete resolution on radiography up to 6 weeks following treatment. He has no cough and no adventitious breath sounds. Would continue colchicine for pericarditis pain.    Plan: 1. Stop cefepime 2. Start zosyn 3.375 mg q6h   3. Check CRP/ESR  4. Check PSA  5. Would consider repeat imaging and re-consult urology considering the possibility of emphysematous epididymitis to  reconsider need for surgery.   Principal Problem:   Chest pain Active Problems:   HIV (human immunodeficiency virus infection) ([place])   Uncontrolled type 2 diabetes with neuropathy ([name])   Hyperlipidemia with target LDL less than 100   Asthma, chronic   Cocaine use disorder, severe, dependence ([name])   CKD (chronic kidney disease) stage 3, GFR 30-59 ml/min ([name])   Gout   HCAP (healthcare-associated pneumonia)   Depression   Epididymo-orchitis, acute   Essential hypertension   . allopurinol  300 mg Oral Daily  . amantadine  100 mg Oral Daily  . aspirin  81 mg Oral Daily  . colchicine  0.6 mg Oral Daily  . diltiazem  120 mg Oral Daily  . emtricitabine-rilpivir-tenofovir AF  1 tablet Oral Q breakfast  . enoxaparin (LOVENOX) injection  40 mg Subcutaneous Q24H  . famotidine  20 mg Oral Daily  . gabapentin  400 mg Oral BID  . insulin aspart  0-9 Units Subcutaneous TID WC  . insulin glargine  20 Units Subcutaneous QHS  . isosorbide mononitrate  30 mg Oral Daily  . polyethylene glycol  17 g Oral Daily  . pravastatin  40 mg Oral QHS  . sertraline  50 mg Oral Daily    HPI: Michael [name] is a 67 y.o. male admitted on 02/20/2018 for chest pain and scrotal pain. He has a past medical history detailed below but significant for HIV ([place], VL < 20 CD4 630 10/2017, sees Dr. Hatcher)recent CABG x 3 4/25 @ [facility], afib (no anticoag), cocaine abuse, CKD3,  and recent admission 5/11 for HCAP treatment (augmentin).   Hospital Course: chest pain was localized to left side with radiation across to the right, sharp 10/10 aggravated by breathing. Troponin elevated. UDS positive for opiates. Afebrile. CTA chest (-) PE but persistent right lower lobe infiltration. Blood cultures have remained negative through day 3.   Scrotal pain also present on admission - ultrasound indicating left hydrocele with epididymo-orchitis. No abscess/torsion present. Right normal. Urine GC/C negative 5/17.  Urology has seen the patient and felt he had some improvement on broader IV therapy (now on cefepime day 3 after 3d levaquin 250 mg, 2 d augmentin and 1d doxycycline). No dysuria, flank pain, pyuria or urethral discharge. No new sexual partners. No history of syphilis, gonorrhea or chlamydia.   Review of Systems: Review of Systems  Constitutional: Positive for fever. Negative for chills and malaise/fatigue.  Respiratory: Negative for cough and sputum production.   Cardiovascular: Negative for chest pain and orthopnea.  Gastrointestinal: Negative for abdominal pain and diarrhea.  Genitourinary: Negative for dysuria, flank pain and hematuria.  Musculoskeletal: Positive for joint pain (left leg pain ).  Skin: Negative for rash.  Neurological: Negative for tingling.  Psychiatric/Behavioral: Negative for depression.    Past Medical History:  Diagnosis Date  . A-fib ([place])   . Asthma    No PFTs, history of childhood asthma  . CAD (coronary artery disease)    a. 06/2013 STEMI/PCI ([facility]): LAD w/ thrombus (treated with BMS), mid 75%, D2 75%; LCX OM2 75%; RCA small, PDA 95%, PLV 95%;  b. 10/2013 Cath/PCI: ISR w/in LAD (Promus DES x 2), borderline OM2 lesion;  c. 01/2014 MV: Intermediate risk, medium-sized distal ant wall infarct w/ very small amt of peri-infarct ischemia. EF 60%.  . Cellulitis 04/2014   left facial  . Chondromalacia of medial femoral condyle    Left knee MRI 04/28/12: Chondromalacia of the medial femoral condyle with slight peripheral degeneration of the meniscocapsular junction of the medial meniscus; followed by sports medicine  . Collagen vascular disease ([place])   . Crack cocaine use    for 20+ years, has been enrolled in detox programs in the past  . Depression    with history of hospitalization for suicidal ideation  . Diabetes mellitus 2002   Diagnosed in 2002, started insulin in 2012  . Gout   . Gout 04/28/2012  . Headache(784.0)    CT head 08/2011: Periventricular and  subcortical white matter hypodensities are most in keeping with chronic microangiopathic change  . HIV infection [facility]) Nov 2012   Followed by [name]  . Hyperlipidemia   . Hypertension   . Pulmonary embolism ([name])     Social History   Tobacco Use  . Smoking status: Never Smoker  . Smokeless tobacco: Never Used  Substance Use Topics  . Alcohol use: No    Alcohol/week: 2.4 oz    Types: 2 Cans of beer, 2 Shots of liquor per week  . Drug use: Yes    Types: "Crack" cocaine, Cocaine    Comment: last use January 2018    Family History  Problem Relation Age of Onset  . Diabetes Mother   . Hypertension Mother   . Hyperlipidemia Mother   . Diabetes Father   . Cancer Father   . Hypertension Father   . Diabetes Brother   . Heart disease Brother   . Diabetes Sister   . Colon cancer Neg Hx    No Known Allergies  OBJECTIVE: Blood pressure Marland Kitchen)  127/93, pulse 91, temperature 99 F (37.2 C), temperature source Oral, resp. rate 20, height _0  (1.702 m), weight 170 lb (77.1 kg), SpO2 100 %.  Physical Exam  Constitutional: He is oriented to person, place, and time. He appears well-developed and well-nourished.  Resting in bed.   HENT:  Mouth/Throat: No oral lesions. Normal dentition. No dental caries.  Eyes: Pupils are equal, round, and reactive to light. No scleral icterus.  Cardiovascular: Normal rate, regular rhythm and normal heart sounds.  Pulmonary/Chest: Effort normal and breath sounds normal. No respiratory distress.  Abdominal: Soft. He exhibits no distension. There is no tenderness. Hernia confirmed negative in the left inguinal area.  Genitourinary:  Genitourinary Comments: Right testicle surgically absent Left testicle with erythema, swelling, induration and significant pain with palpation/manipulation. Cremasteric reflex present.   Musculoskeletal:  Some pain in left groin with straight leg raise, flexion at hip. No tenderness surrounding joint itself only at the  inguinal area.   Lymphadenopathy:    He has no cervical adenopathy.  Neurological: He is alert and oriented to person, place, and time.  Skin: Skin is warm and dry. No rash noted.  Vitals reviewed.   Lab Results Lab Results  Component Value Date   WBC 5.1 02/24/2018   HGB 9.2 (L) 02/24/2018   HCT 29.0 (L) 02/24/2018   MCV 80.8 02/24/2018   PLT 195 02/24/2018    Lab Results  Component Value Date   CREATININE 1.72 (H) 02/24/2018   BUN 28 (H) 02/24/2018   NA 134 (L) 02/24/2018   K 3.9 02/24/2018   CL 102 02/24/2018   CO2 23 02/24/2018    Lab Results  Component Value Date   ALT 15 (L) 02/13/2018   AST 22 02/13/2018   ALKPHOS 90 02/13/2018   BILITOT 0.6 02/13/2018     Microbiology: Recent Results (from the past 240 hour(s))  Culture, blood (Routine X 2) w Reflex to ID Panel     Status: None (Preliminary result)   Collection Time: 02/20/18 10:10 PM  Result Value Ref Range Status   Specimen Description BLOOD BLOOD RIGHT WRIST  Final   Special Requests   Final    BOTTLES DRAWN AEROBIC AND ANAEROBIC Blood Culture adequate volume   Culture   Final    NO GROWTH 3 DAYS Performed at [facility], [place] [address] [place], [place] [address]    Report Status PENDING  Incomplete  Culture, blood (Routine X 2) w Reflex to ID Panel     Status: None (Preliminary result)   Collection Time: 02/20/18 10:30 PM  Result Value Ref Range Status   Specimen Description BLOOD LEFT ANTECUBITAL  Final   Special Requests   Final    BOTTLES DRAWN AEROBIC AND ANAEROBIC Blood Culture adequate volume   Culture   Final    NO GROWTH 3 DAYS Performed at [facility], [place] [address], [place], [place] [address]    Report Status PENDING  Incomplete  Urine Culture     Status: None   Collection Time: 02/21/18  2:22 AM  Result Value Ref Range Status   Specimen Description URINE, RANDOM  Final   Special Requests NONE  Final   Culture   Final    NO GROWTH Performed at [facility], [address], [place],  [address]    Report Status 02/22/2018 FINAL  Final    Janene Madeira, MSN, NP-C [facility] Cell:  [phone] Pager: [phone]  02/24/2018 12:02 PM

## 2018-02-24 NOTE — Evaluation (Signed)
Occupational Therapy Evaluation Patient Details Name: Michael [name] MRN: [id number] DOB: 1951-05-20 Today's Date: 02/24/2018    History of Present Illness Michael [name] is a 67 y.o. male with medical history significant of HTN, HLD, DM, HIV (CD4=630 and VL<20 on 10/14/17), CAD, status post three-vessel CABG on April 25  at [facility], PE, A. fib not on anticoagulants, GERD, gout, depression, cocaine abuse, CKD-3, recent admission for HCAP still on Augmentin, who presents with chest pain and scrotum pain.   Clinical Impression   PTA, pt was at [facility] for rehab s/p CABG on 4/25. Pt was walking with RW with PT and continued to require assistance for dressing/bathing. Pt currently requiring Max A for LB ADLs due to poor ROM with LLE and left groin pain. Pt also requiring Min-Mod A for functional transfer with RW. Pt would benefit from further acute OT to facilitate safe dc. Recommend dc to SNF to continue rehab and for further OT to optimize safety, independence with ADLs, and return to PLOF.      Follow Up Recommendations  SNF;Supervision/Assistance - 24 hour    Equipment Recommendations  Other (comment)(Defer to next venue)    Recommendations for Other Services PT consult     Precautions / Restrictions Precautions Precautions: Sternal Precaution Comments: s/p 4/25 CABG requires assist to recall precautions Restrictions Weight Bearing Restrictions: No Other Position/Activity Restrictions: self WBAT on L LE due to pain      Mobility Bed Mobility Overal bed mobility: Needs Assistance Bed Mobility: Sit to Supine     Supine to sit: Mod assist Sit to supine: Mod assist   General bed mobility comments: Mod A to managing BLEs and elevating them over EOB  Transfers Overall transfer level: Needs assistance Equipment used: Rolling walker (2 wheeled) Transfers: Sit to/from [facility] Sit to Stand: Mod assist Stand pivot transfers: Min assist        General transfer comment: Mod A to power up into standing from recliner and adhere to sternal precautions. Min A for balance during stand pivot transfer. Pt with slight forward flexion due to pain.     Balance Overall balance assessment: Needs assistance Sitting-balance support: Feet supported;Bilateral upper extremity supported Sitting balance-Leahy Scale: Fair     Standing balance support: Bilateral upper extremity supported Standing balance-Leahy Scale: Poor Standing balance comment: dependent on RW due to L LE pain                           ADL either performed or assessed with clinical judgement   ADL Overall ADL's : Needs assistance/impaired Eating/Feeding: Set up;Sitting   Grooming: Supervision/safety;Set up;Sitting Grooming Details (indicate cue type and reason): Pt with poor standing tolerance due to LLE pain  Upper Body Bathing: Set up;Supervision/ safety;Sitting   Lower Body Bathing: Sit to/from stand;Maximal assistance Lower Body Bathing Details (indicate cue type and reason): Poor ROM due to pain of left groin Upper Body Dressing : Sitting;Minimal assistance   Lower Body Dressing: Sit to/from stand;Maximal assistance Lower Body Dressing Details (indicate cue type and reason): Poor ROM due tp left groin pain. Pt unable to flex at hips Toilet Transfer: Ambulation;Moderate assistance(Simulated at recliner) Toilet Transfer Details (indicate cue type and reason): Mod A to power up into standing due to LLE and groin pain Toileting- Clothing Manipulation and Hygiene: Supervision/safety;Sit to/from stand       Functional mobility during ADLs: Minimal assistance(steady A) General ADL Comments: decreased functional performance and  unable to perform LB ADLs.      Vision         Perception     Praxis      Pertinent Vitals/Pain Pain Assessment: 0-10 Pain Score: 8  Pain Location: L scrotum, radiating up into abdomen and down medial L thigh Pain  Descriptors / Indicators: Stabbing Pain Intervention(s): Monitored during session;Limited activity within patient's tolerance;Repositioned     Hand Dominance Right   Extremity/Trunk Assessment Upper Extremity Assessment Upper Extremity Assessment: Overall WFL for tasks assessed;RUE deficits/detail RUE Deficits / Details: Decreased voluntary use of RUE. weakness due to prior CVA per pt   Lower Extremity Assessment Lower Extremity Assessment: Defer to PT evaluation;LLE deficits/detail LLE Deficits / Details: decreased L hip ROM due to pain, able to complete quad set and LAQ but limited hip strength   Cervical / Trunk Assessment Cervical / Trunk Assessment: Other exceptions Cervical / Trunk Exceptions: sternal incision   Communication Communication Communication: No difficulties   Cognition Arousal/Alertness: Awake/alert Behavior During Therapy: Flat affect Overall Cognitive Status: Impaired/Different from baseline                               Problem Solving: Slow processing;Requires verbal cues;Requires tactile cues General Comments: pt quiet, flat affect, delayed processing   General Comments  pt with dressing on sternum    Exercises     Shoulder Instructions      Home Living Family/patient expects to be discharged to:: Skilled nursing facility                                 Additional Comments: has been at [facility] for rehab      Prior Functioning/Environment Level of Independence: Needs assistance  Gait / Transfers Assistance Needed: pt was walking with RW with PT ADL's / Homemaking Assistance Needed: Pt reporting he was getting close to performing ADLs independently. RN was still assisting with dressing/bathing   Comments: Prior to CABG 4/25 independent with all ADLs, community ambulator        OT Problem List: Decreased strength;Decreased range of motion;Decreased activity tolerance;Impaired balance (sitting and/or  standing);Decreased coordination;Decreased cognition;Impaired UE functional use;Pain;Cardiopulmonary status limiting activity;Decreased safety awareness;Decreased knowledge of use of DME or AE;Impaired sensation      OT Treatment/Interventions: Self-care/ADL training;Therapeutic activities;Energy conservation;Therapeutic exercise;DME and/or AE instruction;Cognitive remediation/compensation;Patient/family education;Balance training    OT Goals(Current goals can be found in the care plan section) Acute Rehab OT Goals Patient Stated Goal: stop the pain OT Goal Formulation: With patient Time For Goal Achievement: 03/10/18 Potential to Achieve Goals: Good ADL Goals Pt Will Perform Grooming: with supervision;with set-up;standing Pt Will Perform Lower Body Dressing: sit to/from stand;with adaptive equipment;with set-up;with supervision Pt Will Transfer to Toilet: with set-up;with supervision;ambulating;bedside commode Pt Will Perform Toileting - Clothing Manipulation and hygiene: with set-up;with supervision;sit to/from stand  OT Frequency: Min 2X/week   Barriers to D/C:            Co-evaluation              AM-PAC PT "6 Clicks" Daily Activity     Outcome Measure Help from another person eating meals?: None Help from another person taking care of personal grooming?: A Little Help from another person toileting, which includes using toliet, bedpan, or urinal?: A Little Help from another person bathing (including washing, rinsing, drying)?: A Lot Help from another person  to put on and taking off regular upper body clothing?: A Little Help from another person to put on and taking off regular lower body clothing?: A Lot 6 Click Score: 17   End of Session Equipment Utilized During Treatment: Gait belt Nurse Communication: Patient requests pain meds;Other (comment)(Urine output of 331ml)  Activity Tolerance: Patient limited by fatigue;Patient limited by pain Patient left: in chair;with  call bell/phone within reach;with chair alarm set  OT Visit Diagnosis: Unsteadiness on feet (R26.81);Muscle weakness (generalized) (M62.81);Other symptoms and signs involving cognitive function;Pain Pain - Right/Left: Left Pain - part of body: Leg(groin)                Time: 7897-8478 OT Time Calculation (min): 12 min Charges:  OT General Charges $OT Visit: 1 Visit OT Evaluation $OT Eval Moderate Complexity: 1 Mod G-Codes:     Michael [name] MSOT, OTR/L Acute Rehab Pager: [phone] Office: [place] 02/24/2018, 10:39 AM

## 2018-02-24 NOTE — Evaluation (Signed)
Physical Therapy Evaluation Patient Details Name: Michael [name] MRN: [id number] DOB: 04-01-1951 Today's Date: 02/24/2018   History of Present Illness  Michael [name] is a 67 y.o. male with medical history significant of HTN, HLD, DM, HIV (CD4=630 and VL<20 on 10/14/17), CAD, status post three-vessel CABG on April 25  at [facility], PE, A. fib not on anticoagulants, GERD, gout, depression, cocaine abuse, CKD-3, recent admission for HCAP still on Augmentin, who presents with chest pain and scrotum pain.  Clinical Impression  Pt mobility limited by L groin pain and inability to tolerate WBing on L LE. Pt requiring modA for bed mobility and minA for std pvt to chair. Acute PT to con't to follow.     Follow Up Recommendations SNF(return to [facility])    Equipment Recommendations  None recommended by PT    Recommendations for Other Services       Precautions / Restrictions Precautions Precautions: Sternal Precaution Comments: s/p 4/25 CABG requires assist to recall precautions Restrictions Weight Bearing Restrictions: No Other Position/Activity Restrictions: self WBAT on L LE due to pain      Mobility  Bed Mobility Overal bed mobility: Needs Assistance Bed Mobility: Supine to Sit     Supine to sit: Mod assist     General bed mobility comments: HOB elevated, pt brought LEs off EOB, pt with difficulty using R UE to reach bed rail and pull due to pain and sternal precautions. ModA for trunk elevation and to get hips to EOB  Transfers Overall transfer level: Needs assistance Equipment used: Rolling walker (2 wheeled) Transfers: Sit to/from [facility] Sit to Stand: Min assist Stand pivot transfers: Min assist       General transfer comment: minA to power up due to limited L knee flexion due to pain. Pt with increased blat UE support due to limited L LE WBing. limited to std pvt due to increased bilat UE support  and increased L LE  pain  Ambulation/Gait             General Gait Details: unable due to L groin pain  Stairs            Wheelchair Mobility    Modified Rankin (Stroke Patients Only)       Balance Overall balance assessment: Needs assistance Sitting-balance support: Feet supported;Bilateral upper extremity supported Sitting balance-Leahy Scale: Fair     Standing balance support: Bilateral upper extremity supported Standing balance-Leahy Scale: Poor Standing balance comment: dependent on RW due to L LE pain                             Pertinent Vitals/Pain Pain Assessment: 0-10 Pain Score: 8  Pain Location: L scrotum, radiating up into abdomen and down medial L thigh Pain Descriptors / Indicators: Stabbing Pain Intervention(s): Limited activity within patient's tolerance    Home Living Family/patient expects to be discharged to:: Skilled nursing facility                 Additional Comments: has been at [facility]    Prior Function Level of Independence: Needs assistance   Gait / Transfers Assistance Needed: pt was walking with RW with PT  ADL's / Homemaking Assistance Needed: RN assist for dressing and bathing        Hand Dominance   Dominant Hand: Right    Extremity/Trunk Assessment   Upper Extremity Assessment Upper Extremity Assessment: Defer to OT evaluation  Lower Extremity Assessment Lower Extremity Assessment: LLE deficits/detail LLE Deficits / Details: decreased L hip ROM due to pain, able to complete quad set and LAQ but limited hip strength    Cervical / Trunk Assessment Cervical / Trunk Assessment: Other exceptions Cervical / Trunk Exceptions: sternal incision  Communication   Communication: No difficulties  Cognition Arousal/Alertness: Awake/alert Behavior During Therapy: Flat affect Overall Cognitive Status: Impaired/Different from baseline                               Problem Solving: Slow  processing;Requires verbal cues;Requires tactile cues General Comments: pt quiet, flat affect, delayed processing      General Comments General comments (skin integrity, edema, etc.): pt with dressing on sternum    Exercises     Assessment/Plan    PT Assessment Patient needs continued PT services  PT Problem List Decreased strength;Decreased range of motion;Decreased activity tolerance;Decreased balance;Decreased mobility;Decreased coordination;Decreased knowledge of use of DME;Pain       PT Treatment Interventions DME instruction;Gait training;Functional mobility training;Therapeutic activities;Therapeutic exercise;Balance training;Patient/family education;Cognitive remediation    PT Goals (Current goals can be found in the Care Plan section)  Acute Rehab PT Goals Patient Stated Goal: stop the pain PT Goal Formulation: With patient Time For Goal Achievement: 03/10/18 Potential to Achieve Goals: Good    Frequency Min 2X/week   Barriers to discharge Decreased caregiver support      Co-evaluation               AM-PAC PT "6 Clicks" Daily Activity  Outcome Measure Difficulty turning over in bed (including adjusting bedclothes, sheets and blankets)?: Unable Difficulty moving from lying on back to sitting on the side of the bed? : Unable Difficulty sitting down on and standing up from a chair with arms (e.g., wheelchair, bedside commode, etc,.)?: Unable Help needed moving to and from a bed to chair (including a wheelchair)?: A Lot Help needed walking in hospital room?: A Lot Help needed climbing 3-5 steps with a railing? : A Lot 6 Click Score: 9    End of Session Equipment Utilized During Treatment: Gait belt Activity Tolerance: Patient limited by pain;Patient limited by fatigue Patient left: in chair;with call bell/phone within reach;with chair alarm set;with nursing/sitter in room Nurse Communication: Mobility status PT Visit Diagnosis: Unsteadiness on feet  (R26.81);Pain Pain - Right/Left: Left Pain - part of body: Leg(groin)    Time: 2694-8546 PT Time Calculation (min) (ACUTE ONLY): 20 min   Charges:   PT Evaluation $PT Eval Moderate Complexity: 1 Mod     PT G Codes:        Kittie Plater, PT, DPT Pager #: [phone] Office #: [phone]   Rich Paprocki M Dequane Strahan 02/24/2018, 8:44 AM

## 2018-02-25 DIAGNOSIS — Z21 Asymptomatic human immunodeficiency virus [HIV] infection status: Secondary | ICD-10-CM

## 2018-02-25 LAB — GLUCOSE, CAPILLARY
GLUCOSE-CAPILLARY: 317 mg/dL — AB (ref 65–99)
GLUCOSE-CAPILLARY: 140 mg/dL — AB (ref 65–99)
Glucose-Capillary: 203 mg/dL — ABNORMAL HIGH (ref 65–99)
Glucose-Capillary: 128 mg/dL — ABNORMAL HIGH (ref 65–99)

## 2018-02-25 LAB — PSA: Prostatic Specific Antigen: 1.87 ng/mL (ref 0.00–4.00)

## 2018-02-25 LAB — BASIC METABOLIC PANEL
ANION GAP: 8 (ref 5–15)
BUN: 24 mg/dL — AB (ref 6–20)
CALCIUM: 8.8 mg/dL — AB (ref 8.9–10.3)
CO2: 24 mmol/L (ref 22–32)
Chloride: 103 mmol/L (ref 101–111)
Creatinine, Ser: 1.53 mg/dL — ABNORMAL HIGH (ref 0.61–1.24)
GFR calc Af Amer: 53 mL/min — ABNORMAL LOW (ref >60)
GFR, EST NON AFRICAN AMERICAN: 45 mL/min — AB (ref >60)
GLUCOSE: 268 mg/dL — AB (ref 65–99)
Potassium: 4.1 mmol/L (ref 3.5–5.1)
SODIUM: 135 mmol/L (ref 135–145)

## 2018-02-25 LAB — CBC
HCT: 27.1 % — ABNORMAL LOW (ref 39.0–52.0)
Hemoglobin: 8.6 g/dL — ABNORMAL LOW (ref 13.0–17.0)
MCH: 25.8 pg — AB (ref 26.0–34.0)
MCHC: 31.7 g/dL (ref 30.0–36.0)
MCV: 81.4 fL (ref 78.0–100.0)
PLATELETS: 194 10*3/uL (ref 150–400)
RBC: 3.33 MIL/uL — ABNORMAL LOW (ref 4.22–5.81)
RDW: 13.2 % (ref 11.5–15.5)
WBC: 3.8 10*3/uL — AB (ref 4.0–10.5)

## 2018-02-25 LAB — CULTURE, BLOOD (ROUTINE X 2)
CULTURE: NO GROWTH
Culture: NO GROWTH
SPECIAL REQUESTS: ADEQUATE
Special Requests: ADEQUATE

## 2018-02-25 LAB — SEDIMENTATION RATE: Sed Rate: 127 mm/hr — ABNORMAL HIGH (ref 0–16)

## 2018-02-25 LAB — C-REACTIVE PROTEIN: CRP: 5.9 mg/dL — AB (ref <1.0)

## 2018-02-25 NOTE — Plan of Care (Signed)
Patient is alert and soft spoken with stable vitals. Got up to the chair today with Pt, plan to continue antibiotics and pain control.

## 2018-02-25 NOTE — Care Management Important Message (Signed)
Important Message  Patient Details  Name: Michael [name] MRN: [id number] Date of Birth: 02-Nov-1950   Medicare Important Message Given:  Yes    Devean Skoczylas P Zylee Marchiano 02/25/2018, 3:22 PM

## 2018-02-25 NOTE — Progress Notes (Signed)
PROGRESS NOTE        PATIENT DETAILS Name: Michael [name] Age: 67 y.o. Sex: male Date of Birth: April 20, 1951 Admit Date: 02/20/2018 Admitting Physician Michael Costa, MD [id number], Michael Albee, PA-C  Brief Narrative: Patient is a 67 y.o. male with recent history of CABG approximately 3 weeks back (4/25), HIV, DM-2 presented to the ED for evaluation of pleuritic chest pain and scrotal pain.  Thought to have post pericardiotomy syndrome as the etiology of chest pain, Doppler ultrasound consistent with orchitis/epididymitis.  Subsequently admitted for further evaluation and treatment.  See below for further details  Subjective: Left testicular pain somewhat better today.  No pain in the adjoining left thigh and pelvic area.  Assessment/Plan: Severe left-sided epididymo-orchitis: Slowly improving finally-continue Zosyn.  Appreciate infectious disease and urology input.  Scrotal ultrasound with Doppler done on 5/21 (third this admission) shows improvement as well.  If clinical improvement continues-suspect we can transition him to oral biotics and discharge him home in the next few days  Urine GC probe negative.  Urine culture remains negative.  Chest pain secondary to post cardiotomy syndrome: Essentially resolved-continue colchicine.  CT angiogram of the chest was negative for PE.  TTE showed preserved EF without any wall motion abnormalities.  No further recommendations from cardiology.    Healthcare associated pneumonia: CT angiogram of the chest shows lower lobe consolidation-suspect this may be a residual of his most his pneumonia-he is currently covered with antibiotics which we are primarily using for orchitis.  Blood cultures negative so far.    CAD status post CABG April 2019: See above regarding chest pain-continue aspirin, statin and follow  Acute kidney injury chronic kidney disease stage III: Creatinine improving and now back to usual baseline.  Continue to  hold diuretics and Cozaar for now.      Hypertension: Controlled with just Cardizem.  Cozaar and HCTZ remain on hold.  Marland Kitchen   HIV: Continue with antiretrovirals, last CD4 count January 2019 was 630.  Depression: Stable-continue Zoloft  DVT Prophylaxis: Prophylactic Lovenox   Code Status: Full code  Family Communication: None at bedside  Disposition Plan: Remain inpatient-home once orchitis improves  Antimicrobial agents: Anti-infectives (From admission, onward)   Start     Dose/Rate Route Frequency Ordered Stop   02/24/18 1800  piperacillin-tazobactam (ZOSYN) IVPB 2.25 g  Status:  Discontinued     2.25 g 100 mL/hr over 30 Minutes Intravenous Every 6 hours 02/24/18 1547 02/24/18 1551   02/24/18 1800  piperacillin-tazobactam (ZOSYN) IVPB 3.375 g     3.375 g 12.5 mL/hr over 240 Minutes Intravenous Every 8 hours 02/24/18 1552     02/23/18 1800  doxycycline (VIBRAMYCIN) 100 mg in sodium chloride 0.9 % 250 mL IVPB     100 mg 125 mL/hr over 120 Minutes Intravenous Every 12 hours 02/23/18 1519     02/22/18 1445  ceFEPIme (MAXIPIME) 1 g in sodium chloride 0.9 % 100 mL IVPB  Status:  Discontinued     1 g 200 mL/hr over 30 Minutes Intravenous Every 24 hours 02/22/18 1438 02/24/18 1547   02/21/18 2200  levofloxacin (LEVAQUIN) IVPB 500 mg  Status:  Discontinued     500 mg 100 mL/hr over 60 Minutes Intravenous Every 24 hours 02/21/18 1057 02/21/18 1253   02/21/18 2200  Levofloxacin (LEVAQUIN) IVPB 250 mg  Status:  Discontinued  250 mg 50 mL/hr over 60 Minutes Intravenous Every 24 hours 02/21/18 1253 02/23/18 1519   02/21/18 0800  emtricitabine-rilpivir-tenofovir AF (ODEFSEY) 200-25-25 MG per tablet 1 tablet    Note to Pharmacy:  For HIV infection     1 tablet Oral Daily with breakfast 02/20/18 2145     02/20/18 2200  amoxicillin-clavulanate (AUGMENTIN) 875-125 MG per tablet 1 tablet  Status:  Discontinued     1 tablet Oral Every 12 hours 02/20/18 2145 02/21/18 1100   02/20/18 2200   levofloxacin (LEVAQUIN) IVPB 500 mg  Status:  Discontinued     500 mg 100 mL/hr over 60 Minutes Intravenous Every 24 hours 02/20/18 2159 02/21/18 1057   02/20/18 1930  sulfamethoxazole-trimethoprim (BACTRIM DS,SEPTRA DS) 800-160 MG per tablet 1 tablet     1 tablet Oral  Once 02/20/18 1921 02/20/18 1930      Procedures: None  CONSULTS:  cardiology  Urology Infectious disease  Time spent: 25 minutes-Greater than 50% of this time was spent in counseling, explanation of diagnosis, planning of further management, and coordination of care.  MEDICATIONS: Scheduled Meds: . allopurinol  300 mg Oral Daily  . amantadine  100 mg Oral Daily  . aspirin  81 mg Oral Daily  . colchicine  0.6 mg Oral Daily  . diltiazem  120 mg Oral Daily  . emtricitabine-rilpivir-tenofovir AF  1 tablet Oral Q breakfast  . enoxaparin (LOVENOX) injection  40 mg Subcutaneous Q24H  . famotidine  20 mg Oral Daily  . gabapentin  400 mg Oral BID  . insulin aspart  0-9 Units Subcutaneous TID WC  . insulin glargine  20 Units Subcutaneous QHS  . isosorbide mononitrate  30 mg Oral Daily  . polyethylene glycol  17 g Oral Daily  . pravastatin  40 mg Oral QHS  . sertraline  50 mg Oral Daily   Continuous Infusions: . doxycycline (VIBRAMYCIN) IV Stopped (02/25/18 1610)  . piperacillin-tazobactam (ZOSYN)  IV 3.375 g (02/25/18 0923)   PRN Meds:.acetaminophen, albuterol, ALPRAZolam, dextromethorphan-guaiFENesin, hydrALAZINE, hydrOXYzine, morphine injection, nitroGLYCERIN, ondansetron (ZOFRAN) IV, oxyCODONE, traZODone, zolpidem   PHYSICAL EXAM: Vital signs: Vitals:   02/24/18 1143 02/24/18 1606 02/24/18 2144 02/25/18 0546  BP: (!) 127/93 108/66 (!) 141/76 118/71  Pulse: 91 92 95 88  Resp: 20 18 18 18   Temp: 99 F (37.2 C) 98.1 F (36.7 C) 98.6 F (37 C) 98.2 F (36.8 C)  TempSrc: Oral Oral Oral Oral  SpO2: 100% 99% 98% 98%  Weight:    77.2 kg (170 lb 3.2 oz)  Height:       Filed Weights   02/23/18 0518  02/24/18 0323 02/25/18 0546  Weight: 75.6 kg (166 lb 9.6 oz) 77.1 kg (170 lb) 77.2 kg (170 lb 3.2 oz)   Body mass index is 26.66 kg/m.   General appearance:Awake, alert, not in any distress.  Eyes:no scleral icterus. HEENT: Atraumatic and Normocephalic Neck: supple, no JVD. Resp:Good air entry bilaterally,no rales or rhonchi CVS: S1 S2 regular, no murmurs.  GI: Bowel sounds present, Non tender and not distended with no gaurding, rigidity or rebound.left testicle significantly improved-he actually would like me palpated today-was not able to do that for the past few days. Extremities: B/L Lower Ext shows no edema, both legs are warm to touch Neurology:  Non focal Psychiatric: Normal judgment and insight. Normal mood. Musculoskeletal:No digital cyanosis Skin:No Rash, warm and dry Wounds:N/A  I have personally reviewed following labs and imaging studies  LABORATORY DATA: CBC: Recent Labs  Lab  02/20/18 1318 02/22/18 0715 02/23/18 0725 02/24/18 0550 02/25/18 0509  WBC 8.2 10.5 8.0 5.1 3.8*  HGB 10.0* 10.0* 9.5* 9.2* 8.6*  HCT 31.1* 31.7* 30.0* 29.0* 27.1*  MCV 82.3 80.9 80.6 80.8 81.4  PLT 259 213 197 195 696    Basic Metabolic Panel: Recent Labs  Lab 02/20/18 1318 02/22/18 0715 02/23/18 0725 02/24/18 0550 02/25/18 0509  NA 137 131* 132* 134* 135  K 4.5 4.2 4.0 3.9 4.1  CL 102 96* 97* 102 103  CO2 24 23 23 23 24   GLUCOSE 152* 105* 182* 162* 268*  BUN 23* 17 26* 28* 24*  CREATININE 1.78* 1.68* 2.03* 1.72* 1.53*  CALCIUM 9.3 9.2 9.0 8.9 8.8*    GFR: Estimated Creatinine Clearance: 43.8 mL/min (A) (by C-G formula based on SCr of 1.53 mg/dL (H)).  Liver Function Tests: No results for input(s): AST, ALT, ALKPHOS, BILITOT, PROT, ALBUMIN in the last 168 hours. No results for input(s): LIPASE, AMYLASE in the last 168 hours. No results for input(s): AMMONIA in the last 168 hours.  Coagulation Profile: No results for input(s): INR, PROTIME in the last 168  hours.  Cardiac Enzymes: Recent Labs  Lab 02/21/18 0350 02/21/18 0928  TROPONINI 0.09* 0.10*    BNP (last 3 results) No results for input(s): PROBNP in the last 8760 hours.  HbA1C: No results for input(s): HGBA1C in the last 72 hours.  CBG: Recent Labs  Lab 02/24/18 1148 02/24/18 1634 02/24/18 1701 02/24/18 2232 02/25/18 0727  GLUCAP 256* 170* 195* 221* 203*    Lipid Profile: No results for input(s): CHOL, HDL, LDLCALC, TRIG, CHOLHDL, LDLDIRECT in the last 72 hours.  Thyroid Function Tests: No results for input(s): TSH, T4TOTAL, FREET4, T3FREE, THYROIDAB in the last 72 hours.  Anemia Panel: No results for input(s): VITAMINB12, FOLATE, FERRITIN, TIBC, IRON, RETICCTPCT in the last 72 hours.  Urine analysis:    Component Value Date/Time   COLORURINE YELLOW 02/20/2018 1733   APPEARANCEUR HAZY (A) 02/20/2018 1733   LABSPEC 1.023 02/20/2018 1733   PHURINE 6.0 02/20/2018 1733   GLUCOSEU 50 (A) 02/20/2018 1733   HGBUR NEGATIVE 02/20/2018 1733   BILIRUBINUR NEGATIVE 02/20/2018 1733   KETONESUR NEGATIVE 02/20/2018 1733   PROTEINUR 100 (A) 02/20/2018 1733   UROBILINOGEN 0.2 08/13/2015 1030   NITRITE NEGATIVE 02/20/2018 1733   LEUKOCYTESUR SMALL (A) 02/20/2018 1733    Sepsis Labs: Lactic Acid, Venous    Component Value Date/Time   LATICACIDVEN 2.25 (HH) 03/06/2015 1810    MICROBIOLOGY: Recent Results (from the past 240 hour(s))  Culture, blood (Routine X 2) w Reflex to ID Panel     Status: None (Preliminary result)   Collection Time: 02/20/18 10:10 PM  Result Value Ref Range Status   Specimen Description BLOOD BLOOD RIGHT WRIST  Final   Special Requests   Final    BOTTLES DRAWN AEROBIC AND ANAEROBIC Blood Culture adequate volume   Culture   Final    NO GROWTH 4 DAYS Performed at [facility], [place] [address], [place], [place] [address]    Report Status PENDING  Incomplete  Culture, blood (Routine X 2) w Reflex to ID Panel     Status: None (Preliminary  result)   Collection Time: 02/20/18 10:30 PM  Result Value Ref Range Status   Specimen Description BLOOD LEFT ANTECUBITAL  Final   Special Requests   Final    BOTTLES DRAWN AEROBIC AND ANAEROBIC Blood Culture adequate volume   Culture   Final  NO GROWTH 4 DAYS Performed at [facility], [place] [address], [place], [place] [address]    Report Status PENDING  Incomplete  Urine Culture     Status: None   Collection Time: 02/21/18  2:22 AM  Result Value Ref Range Status   Specimen Description URINE, RANDOM  Final   Special Requests NONE  Final   Culture   Final    NO GROWTH Performed at [facility], [address], [place], [place] [address]    Report Status 02/22/2018 FINAL  Final    RADIOLOGY STUDIES/RESULTS: Dg Chest 2 View  Result Date: 02/20/2018 CLINICAL DATA:  Sudden onset sharp chest pain, short of breath EXAM: CHEST - 2 VIEW COMPARISON:  Chest radiograph 04/01/17 FINDINGS: Sternotomy wires overlie normal cardiac silhouette. There is elevation of the RIGHT hemidiaphragm which is similar to the recent CT and new from chest x-ray of 04/01/2017. RIGHT lower lobe consolidations similar to comparison CT. LEFT lung clear. IMPRESSION: Persistent RIGHT lower lobe consolidation. Recommend follow-up CT with contrast to evaluate after appropriate therapy (3-4 weeks). Persistent elevation of the RIGHT hemidiaphragm new from radiograph 04/01/2017 Electronically Signed   By: Suzy Bouchard M.D.   On: 02/20/2018 14:34   Dg Shoulder Right  Result Date: 02/15/2018 CLINICAL DATA:  Acute RIGHT shoulder pain for 4 days. No known injury. Initial encounter. EXAM: RIGHT SHOULDER - 2+ VIEW COMPARISON:  None. FINDINGS: There is no evidence of fracture or dislocation. There is no evidence of arthropathy or other focal bone abnormality. Soft tissues are unremarkable. IMPRESSION: Negative. Electronically Signed   By: Margarette Canada M.D.   On: 02/15/2018 17:53   Ct Angio Chest Pe W And/or Wo  Contrast  Result Date: 02/20/2018 CLINICAL DATA:  LEFT-sided chest pain radiating to RIGHT-sided chest, starting today. PE suspected, intermediate probability, positive D-dimer. EXAM: CT ANGIOGRAPHY CHEST WITH CONTRAST TECHNIQUE: Multidetector CT imaging of the chest was performed using the standard protocol during bolus administration of intravenous contrast. Multiplanar CT image reconstructions and MIPs were obtained to evaluate the vascular anatomy. CONTRAST:  90mL ISOVUE-370 IOPAMIDOL (ISOVUE-370) INJECTION 76% COMPARISON:  Chest CT angiogram dated 02/13/2018. Chest CT angiogram dated 04/02/2017. FINDINGS: Cardiovascular: Evaluation of the peripheral segmental and subsegmental pulmonary artery branches to the LEFT lower lobe is limited due to patient breathing motion artifact, however, there is no pulmonary embolism identified within the main, lobar or central segmental pulmonary arteries to the LEFT lung. There is no pulmonary embolism identified within the main, lobar or segmental pulmonary arteries to the RIGHT lung. Heart size is within normal limits. No pericardial effusion. No thoracic aortic aneurysm. Scattered mild aortic atherosclerosis. Status post median sternotomy for presumed CABG. Mediastinum/Nodes: No mass or enlarged lymph nodes seen within the mediastinum or perihilar regions. Esophagus appears normal. Trachea and central bronchi are unremarkable. Lungs/Pleura: Persistent dense consolidation within the RIGHT lower lobe, posterior aspect, not appreciably changed compared to the most recent chest CT angiogram of 02/13/2018, new compared to the earlier chest CT of 04/02/2017. No new lung abnormality.  No pleural effusion or pneumothorax. Upper Abdomen: Limited images of the upper abdomen are unremarkable. Musculoskeletal: Mild degenerative spondylosis throughout the mid and lower thoracic spine. No acute or suspicious osseous finding. Review of the MIP images confirms the above findings.  IMPRESSION: 1. No pulmonary embolism seen, with mild study limitations detailed above. 2. Persistent dense consolidation within the RIGHT lower lobe, stable, most likely pneumonia. Consider follow-up chest CT in 4-6 weeks to ensure resolution.  Aortic Atherosclerosis (ICD10-I70.0). Electronically Signed   By: Franki Cabot M.D.   On: 02/20/2018 18:43   Ct Angio Chest Pe W And/or Wo Contrast  Result Date: 02/13/2018 CLINICAL DATA:  67 year old male with shortness of breath. EXAM: CT ANGIOGRAPHY CHEST WITH CONTRAST TECHNIQUE: Multidetector CT imaging of the chest was performed using the standard protocol during bolus administration of intravenous contrast. Multiplanar CT image reconstructions and MIPs were obtained to evaluate the vascular anatomy. CONTRAST:  128mL ISOVUE-370 IOPAMIDOL (ISOVUE-370) INJECTION 76% COMPARISON:  Chest radiograph dated 04/01/2017 and CT dated 04/02/2017 FINDINGS: Cardiovascular: There is no cardiomegaly or pericardial effusion. Multi vessel coronary vascular calcification and postsurgical changes of CABG. Mild anterior mediastinal/retrosternal stranding related to recent surgery. No fluid collection. There is mild atherosclerotic calcification of the thoracic aorta. No aneurysmal dilatation or dissection. Evaluation of the pulmonary arteries is limited due to respiratory motion artifact. No pulmonary artery embolus identified. Mediastinum/Nodes: There is no hilar or mediastinal adenopathy. Esophagus and the thyroid gland are grossly unremarkable. No mediastinal fluid collection. Lungs/Pleura: There is an area of consolidative change with air bronchogram in the right lower lobe. Focal area of hazy and ground-glass density in the medial basal segment of the left lower lobe noted which may represent atelectasis/scarring versus infiltrate although an area of infarct is not excluded. There is no pleural effusion or pneumothorax. The central airways are patent. Upper Abdomen: No acute  abnormality. Musculoskeletal: Median sternotomy wires. Degenerative changes of the spine. No acute osseous pathology Review of the MIP images confirms the above findings. IMPRESSION: 1. No CT evidence of pulmonary embolism. 2. Consolidative changes of the right lower lobe most consistent with pneumonia. Clinical correlation and follow-up to resolution recommended. Smaller focal ground-glass density at the left lung base may also represent infection. 3. Multi vessel coronary vascular calcification and postsurgical changes of CABG. No mediastinal fluid collection. Electronically Signed   By: Anner Crete M.D.   On: 02/13/2018 23:28   Ct Pelvis Wo Contrast  Result Date: 02/22/2018 CLINICAL DATA:  Follow-up epididymitis orchitis. Assess for free air. EXAM: CT PELVIS WITHOUT CONTRAST TECHNIQUE: Multidetector CT imaging of the pelvis was performed following the standard protocol without intravenous contrast. COMPARISON:  Scrotal ultrasound Feb 20, 2018 and CT abdomen and pelvis September 21, 2015 FINDINGS: Urinary Tract:  Well distended, normal. Bowel: Moderate amount of retained large bowel stool. Included small and large bowel are normal in course and caliber. Vascular/Lymphatic: Included aortoiliac vessels are normal in course and caliber, mild calcific atherosclerosis Reproductive:  Normal. Other: Partially imaged thickened scrotum. Please note, scrotum has not been fully imaged. Partially imaged perineum is unremarkable. Small amount of free fluid in the LEFT inguinal canal. No intraperitoneal free fluid or free air in the pelvis. Small fat containing umbilical hernia. Phleboliths in the pelvis. Musculoskeletal: Nonacute. Status post L5-S1 PLIF with arthrodesis, intact laminar hooks and cerclage wires. Severe canal stenosis L4-5, severe L4-5 neural foraminal narrowing. IMPRESSION: 1. Scrotum and perineum incompletely imaged. No subcutaneous emphysema with included pelvis. 2. L5-S1 PLIF, adjacent segment  disease with severe L4-5 canal stenosis and severe L4-5 neural foraminal narrowing. Aortic Atherosclerosis (ICD10-I70.0). Electronically Signed   By: Elon Alas M.D.   On: 02/22/2018 16:02   US Scrotum  Result Date: 02/20/2018 CLINICAL DATA:  67 year old male with acute LEFT testicular pain for 3 days. EXAM: SCROTAL ULTRASOUND DOPPLER ULTRASOUND OF THE TESTICLES TECHNIQUE: Complete ultrasound examination of the testicles, epididymis, and other scrotal structures was performed. Color and spectral Doppler ultrasound were also  utilized to evaluate blood flow to the testicles. COMPARISON:  None. FINDINGS: Right testicle Not identified within the scrotal sac or RIGHT inguinal region. Left testicle Measurements: 3.2 x 2.1 x 2.4 cm with increased vascularity. No mass or microlithiasis visualized. Right epididymis:  Not visualized Left epididymis:  Enlarged and hypervascular. Hydrocele:  Small LEFT hydrocele noted. Varicocele:  Mild LEFT varicocele noted Pulsed Doppler interrogation of the LEFT testicle demonstrates normal low resistance arterial and venous waveforms. IMPRESSION: 1. LEFT epididymo-orchitis with small LEFT hydrocele. No abscess or testicular torsion. 2. RIGHT testicle not identified within the scrotal sac or RIGHT inguinal region. Correlate with history. Electronically Signed   By: Margarette Canada M.D.   On: 02/20/2018 19:10   US Scrotum Doppler  Result Date: 02/20/2018 CLINICAL DATA:  67 year old male with acute LEFT testicular pain for 3 days. EXAM: SCROTAL ULTRASOUND DOPPLER ULTRASOUND OF THE TESTICLES TECHNIQUE: Complete ultrasound examination of the testicles, epididymis, and other scrotal structures was performed. Color and spectral Doppler ultrasound were also utilized to evaluate blood flow to the testicles. COMPARISON:  None. FINDINGS: Right testicle Not identified within the scrotal sac or RIGHT inguinal region. Left testicle Measurements: 3.2 x 2.1 x 2.4 cm with increased vascularity.  No mass or microlithiasis visualized. Right epididymis:  Not visualized Left epididymis:  Enlarged and hypervascular. Hydrocele:  Small LEFT hydrocele noted. Varicocele:  Mild LEFT varicocele noted Pulsed Doppler interrogation of the LEFT testicle demonstrates normal low resistance arterial and venous waveforms. IMPRESSION: 1. LEFT epididymo-orchitis with small LEFT hydrocele. No abscess or testicular torsion. 2. RIGHT testicle not identified within the scrotal sac or RIGHT inguinal region. Correlate with history. Electronically Signed   By: Margarette Canada M.D.   On: 02/20/2018 19:10   US Scrotum W/doppler  Result Date: 02/24/2018 CLINICAL DATA:  Follow-up epididymal orchitis EXAM: SCROTAL ULTRASOUND DOPPLER ULTRASOUND OF THE TESTICLES TECHNIQUE: Complete ultrasound examination of the testicles, epididymis, and other scrotal structures was performed. Color and spectral Doppler ultrasound were also utilized to evaluate blood flow to the testicles. COMPARISON:  02/22/2018, 02/20/2018, pelvic CT 02/22/2018 FINDINGS: Right testicle Surgically absent Left testicle Measurements: 2.8 x 1.5 x 2.4 cm. No mass or microlithiasis visualized. Surgically absent Left epididymis: Normal in size and appearance. Today it measures 6.7 x 4.3 x 6.4 mm, compared with 2.3 x 7.1 mm previously. Hydrocele:  Small left hydrocele Varicocele:  None visualized. Pulsed Doppler interrogation of both testes demonstrates normal low resistance arterial and venous waveforms bilaterally. IMPRESSION: 1. Decreased edema and hyperemia of the left epididymis and testis, consistent with resolving epididymal orchitis. Small left hydrocele 2. Surgically absent right testis Electronically Signed   By: Donavan Foil M.D.   On: 02/24/2018 23:51   US Scrotum W/doppler  Result Date: 02/22/2018 CLINICAL DATA:  Left-sided pelvic pain EXAM: SCROTAL ULTRASOUND DOPPLER ULTRASOUND OF THE TESTICLES TECHNIQUE: Complete ultrasound examination of the testicles,  epididymis, and other scrotal structures was performed. Color and spectral Doppler ultrasound were also utilized to evaluate blood flow to the testicles. COMPARISON:  Two days ago FINDINGS: Right testicle Surgically absent, reportedly due to trauma in the 1980s Left testicle Measurements: 32 x 24 x 25 mm. Hypervascularity. No collection or heterogeneity. Right epididymis:  Surgically absent Left epididymis: Masslike thickening with hypervascularity that has progressed from prior. There are multiple echogenic foci with possible mild shadowing, increased from prior. Hydrocele:  Negative for  pyocele Varicocele:  Not assessed in this setting Pulsed Doppler interrogation of the left testicle demonstrates normal low resistance arterial and  venous waveforms. These results were called by telephone at the time of interpretation on 02/22/2018 at 1:41 pm to Dr. Oren Binet. IMPRESSION: 1. Left epididymo-orchitis with worsening swelling from 2 days ago. There are multiple echogenic foci within the thickened epididymis, emphysematous epididymitis is a consideration in this immunocompromised diabetic. Noncontrast pelvis CT could further evaluate. 2. No abscess or pyocele. 3. Remote right orchiectomy. Electronically Signed   By: Monte Fantasia M.D.   On: 02/22/2018 13:41     LOS: 4 days   Oren Binet, MD  [facility]  If 7PM-7AM, please contact night-coverage  Please page via [email or web address]-click on MD name and type text message  02/25/2018, 11:21 AM

## 2018-02-25 NOTE — Progress Notes (Signed)
Inpatient Diabetes Program Recommendations  AACE/ADA: New Consensus Statement on Inpatient Glycemic Control (2019)  Target Ranges:  Prepandial:   less than 140 mg/dL      Peak postprandial:   less than 180 mg/dL (1-2 hours)      Critically ill patients:  140 - 180 mg/dL  Results for OMRI, BERTRAN (MRN [id number]) as of 02/25/2018 09:48  Ref. Range 02/24/2018 07:43 02/24/2018 11:25 02/24/2018 11:48 02/24/2018 16:34 02/24/2018 17:01 02/24/2018 22:32 02/25/2018 07:27  Glucose-Capillary Latest Ref Range: 65 - 99 mg/dL 145 (H) 233 (H) 256 (H) 170 (H) 195 (H) 221 (H) 203 (H)   Results for DEVELL, PARKERSON (MRN [id number]) as of 02/24/2018 10:11  Ref. Range 02/23/2018 07:42 02/23/2018 11:28 02/23/2018 21:31 02/24/2018 07:43  Glucose-Capillary Latest Ref Range: 65 - 99 mg/dL 176 (H) 213 (H) 210 (H) 145 (H)   Review of Glycemic Control  Diabetes history: DM2 Outpatient Diabetes medications: Lantus 30 units QHS, Humalog 4 units TID, Metformin 1000 mg BID Current orders for Inpatient glycemic control: Lantus 20 units QHS, Novolog 0-9 units TID   Inpatient Diabetes Program Recommendations: Insulin - Meal Coverage: Please consider ordering Novolog 3 units TID wtih meals for meal coverage if patient eats at least 50% of meals. Insulin-Correction: Please consider ordering Novolog 0-5 units QHS for bedtime correction.  Thanks, Barnie Alderman, RN, MSN, CDE Diabetes Coordinator Inpatient Diabetes Program [phone] (Team Pager from 8am to 5pm)

## 2018-02-25 NOTE — Progress Notes (Signed)
[facility] for Infectious Disease  Date of Admission:  02/20/2018   Total days of antibiotics 6        Day 2 Zosyn          Patient ID: Michael [name] is a 67 y.o. male with  Principal Problem:   Epididymo-orchitis, acute Active Problems:   HIV (human immunodeficiency virus infection) ([place])   Uncontrolled type 2 diabetes with neuropathy ([place])   Hyperlipidemia with target LDL less than 100   Asthma, chronic   Cocaine use disorder, severe, dependence ([name])   CKD (chronic kidney disease) stage 3, GFR 30-59 ml/min ([name])   Gout   HCAP (healthcare-associated pneumonia)   Depression   Essential hypertension   Chest pain   . allopurinol  300 mg Oral Daily  . amantadine  100 mg Oral Daily  . aspirin  81 mg Oral Daily  . colchicine  0.6 mg Oral Daily  . diltiazem  120 mg Oral Daily  . emtricitabine-rilpivir-tenofovir AF  1 tablet Oral Q breakfast  . enoxaparin (LOVENOX) injection  40 mg Subcutaneous Q24H  . famotidine  20 mg Oral Daily  . gabapentin  400 mg Oral BID  . insulin aspart  0-9 Units Subcutaneous TID WC  . insulin glargine  20 Units Subcutaneous QHS  . isosorbide mononitrate  30 mg Oral Daily  . polyethylene glycol  17 g Oral Daily  . pravastatin  40 mg Oral QHS  . sertraline  50 mg Oral Daily    SUBJECTIVE: Feeling better today. Sat on the side of the bed and tried to stand but still too painful but improved per his account.   No Known Allergies  OBJECTIVE: Vitals:   02/24/18 1606 02/24/18 2144 02/25/18 0546 02/25/18 1208  BP: 108/66 (!) 141/76 118/71 110/78  Pulse: 92 95 88 90  Resp: 18 18 18 18   Temp: 98.1 F (36.7 C) 98.6 F (37 C) 98.2 F (36.8 C) (!) 97.3 F (36.3 C)  TempSrc: Oral Oral Oral Oral  SpO2: 99% 98% 98% 99%  Weight:   170 lb 3.2 oz (77.2 kg)   Height:       Body mass index is 26.66 kg/m.  Physical Exam  Constitutional: He is oriented to person, place, and time. He appears well-developed and well-nourished.    HENT:  Mouth/Throat: No oral lesions. Normal dentition. No dental caries.  Eyes: No scleral icterus.  Cardiovascular: Normal rate, regular rhythm, normal heart sounds, intact distal pulses and normal pulses.  Pulmonary/Chest: Effort normal and breath sounds normal.  Abdominal: Soft. He exhibits no distension. There is no tenderness.  Genitourinary:  Genitourinary Comments: Left scrotum with improved erythema today. Able to manipulate area much better today and not nearly as tender.   Lymphadenopathy:    He has no cervical adenopathy.  Neurological: He is alert and oriented to person, place, and time.  Skin: Skin is warm and dry. No rash noted.  Midsternal incision clean and dry. Open to air.   Vitals reviewed.   Lab Results Lab Results  Component Value Date   WBC 3.8 (L) 02/25/2018   HGB 8.6 (L) 02/25/2018   HCT 27.1 (L) 02/25/2018   MCV 81.4 02/25/2018   PLT 194 02/25/2018    Lab Results  Component Value Date   CREATININE 1.53 (H) 02/25/2018   BUN 24 (H) 02/25/2018   NA 135 02/25/2018   K 4.1 02/25/2018   CL 103 02/25/2018   CO2  24 02/25/2018    Lab Results  Component Value Date   ALT 15 (L) 02/13/2018   AST 22 02/13/2018   ALKPHOS 90 02/13/2018   BILITOT 0.6 02/13/2018     Microbiology: Recent Results (from the past 240 hour(s))  Culture, blood (Routine X 2) w Reflex to ID Panel     Status: None   Collection Time: 02/20/18 10:10 PM  Result Value Ref Range Status   Specimen Description BLOOD BLOOD RIGHT WRIST  Final   Special Requests   Final    BOTTLES DRAWN AEROBIC AND ANAEROBIC Blood Culture adequate volume   Culture   Final    NO GROWTH 5 DAYS Performed at [facility], [address] [place], [place] [address]    Report Status 02/25/2018 FINAL  Final  Culture, blood (Routine X 2) w Reflex to ID Panel     Status: None   Collection Time: 02/20/18 10:30 PM  Result Value Ref Range Status   Specimen Description BLOOD LEFT ANTECUBITAL  Final    Special Requests   Final    BOTTLES DRAWN AEROBIC AND ANAEROBIC Blood Culture adequate volume   Culture   Final    NO GROWTH 5 DAYS Performed at [facility], [name] [address], [place], [place] [address]    Report Status 02/25/2018 FINAL  Final  Urine Culture     Status: None   Collection Time: 02/21/18  2:22 AM  Result Value Ref Range Status   Specimen Description URINE, RANDOM  Final   Special Requests NONE  Final   Culture   Final    NO GROWTH Performed at [facility], [place] [address], [place], [place] [address]    Report Status 02/22/2018 FINAL  Final    ASSESSMENT: 67 y.o. male with well-controlled HIV presented with post-CABG pericarditis and scrotal pain. Has had serial ultrasounds considering lack of improvement with IV antibiotics. Added anaerobic coverage with Zosyn. Repeat U/S of left testicle last pm showing resolving epidydmo-orchitis with less swelling and induration. He is clinically improved. Would continue zosyn another day and re-assess readiness for PO therapy soon.   Recently treated for HCAP and showing residual infiltrate < 10d following treatment start for this. Asymptomatic and not likely currently contributing and is a resolving process. Sternal incision looks good.    PLAN: 1. Continue zosyn today 2. Colchicine per primary team for pericarditis   Janene Madeira, MSN, NP-C [place] [facility] for Infectious [place] Cell: [phone] Pager: [phone]  02/25/2018  4:06 PM

## 2018-02-25 NOTE — Plan of Care (Signed)
  Problem: Safety: Goal: Ability to remain free from injury will improve Outcome: Progressing   Problem: Elimination: Goal: Will not experience complications related to bowel motility Outcome: Not Progressing  Pt. Unable to have bowel movement. Miralax given previoius shift. RN will continue to monitor.

## 2018-02-25 NOTE — Plan of Care (Signed)
  Problem: Education: Goal: Knowledge of General Education information will improve Outcome: Progressing   Problem: Nutrition: Goal: Adequate nutrition will be maintained 02/25/2018 1851 by Rolm Baptise, RN Note:  Poor feeder 02/25/2018 1841 by Rolm Baptise, RN Outcome: Progressing   Problem: Activity: Goal: Risk for activity intolerance will decrease Note:  Bilateral AKA

## 2018-02-26 ENCOUNTER — Inpatient Hospital Stay (HOSPITAL_COMMUNITY): Payer: Medicare Other

## 2018-02-26 DIAGNOSIS — M79609 Pain in unspecified limb: Secondary | ICD-10-CM

## 2018-02-26 LAB — GLUCOSE, CAPILLARY
GLUCOSE-CAPILLARY: 120 mg/dL — AB (ref 65–99)
GLUCOSE-CAPILLARY: 316 mg/dL — AB (ref 65–99)
GLUCOSE-CAPILLARY: 241 mg/dL — AB (ref 65–99)
Glucose-Capillary: 331 mg/dL — ABNORMAL HIGH (ref 65–99)

## 2018-02-26 MED ORDER — ISOSORBIDE MONONITRATE ER 30 MG PO TB24: 60.0000 mg | ORAL_TABLET | Freq: Every day | ORAL | 0 refills | Status: DC

## 2018-02-26 MED ORDER — SENNA 8.6 MG PO TABS: 2.0000 | ORAL_TABLET | Freq: Every evening | ORAL | 0 refills | Status: DC | PRN

## 2018-02-26 MED ORDER — SENNA 8.6 MG PO TABS
2.0000 | ORAL_TABLET | Freq: Every day | ORAL | Status: DC
  Administered 2018-02-26: 17.2 mg via ORAL
  Filled 2018-02-26: qty 2

## 2018-02-26 MED ORDER — METRONIDAZOLE 500 MG PO TABS: 500.0000 mg | ORAL_TABLET | Freq: Three times a day (TID) | ORAL | 0 refills | Status: DC

## 2018-02-26 MED ORDER — OXYCODONE HCL 5 MG PO TABS: 5.0000 mg | ORAL_TABLET | Freq: Four times a day (QID) | ORAL | 0 refills | Status: DC | PRN

## 2018-02-26 MED ORDER — COLCHICINE 0.6 MG PO TABS: 0.6000 mg | ORAL_TABLET | Freq: Every day | ORAL | Status: DC

## 2018-02-26 MED ORDER — BISACODYL 5 MG PO TBEC
5.0000 mg | DELAYED_RELEASE_TABLET | Freq: Once | ORAL | Status: DC
  Filled 2018-02-26: qty 1

## 2018-02-26 MED ORDER — POLYETHYLENE GLYCOL 3350 17 G PO PACK: 17.0000 g | PACK | Freq: Every day | ORAL | 0 refills | Status: DC

## 2018-02-26 MED ORDER — CIPROFLOXACIN HCL 500 MG PO TABS: 500.0000 mg | ORAL_TABLET | Freq: Two times a day (BID) | ORAL | 0 refills | Status: DC

## 2018-02-26 MED ORDER — BISACODYL 10 MG RE SUPP: 10.0000 mg | Freq: Once | RECTAL | Status: DC

## 2018-02-26 NOTE — Progress Notes (Signed)
Physical Therapy Treatment Patient Details Name: Michael [name] MRN: [id number] DOB: 01-31-1951 Today's Date: 02/26/2018    History of Present Illness Michael [name] is a 66 y.o. male with medical history significant of HTN, HLD, DM, HIV (CD4=630 and VL<20 on 10/14/17), CAD, status post three-vessel CABG on April 25  at [facility], PE, A. fib not on anticoagulants, GERD, gout, depression, cocaine abuse, CKD-3, recent admission for HCAP still on Augmentin, who presents with chest pain and scrotum pain.    PT Comments    Patient progressing well with therapy at this time. Able to progress to ambulation in hallway, 27' with min guard. Pt also requiring less assistance with bed mobility and transfers, min A at this time. Still reporting pain in groin, with potential muscle soreness from guarding. Pt planning on returning to SNF, feel this is appropriate to regain independence.    Follow Up Recommendations  SNF     Equipment Recommendations  None recommended by PT    Recommendations for Other Services       Precautions / Restrictions Precautions Precautions: Sternal Restrictions Weight Bearing Restrictions: No    Mobility  Bed Mobility Overal bed mobility: Needs Assistance Bed Mobility: Sit to Supine     Supine to sit: Supervision        Transfers Overall transfer level: Needs assistance Equipment used: Rolling walker (2 wheeled) Transfers: Sit to/from Stand Sit to Stand: Min assist Stand pivot transfers: Min assist       General transfer comment: min A to power up into RW, cues for sternabl precautions  Ambulation/Gait Ambulation/Gait assistance: Min guard;Supervision Ambulation Distance (Feet): 80 Feet Assistive device: Rolling walker (2 wheeled) Gait Pattern/deviations: Step-through pattern;Decreased dorsiflexion - left;Decreased weight shift to left;Shuffle Gait velocity: decreased   General Gait Details: ambulating hallway with RW, close supervision  intermittent min guard   Stairs             Wheelchair Mobility    Modified Rankin (Stroke Patients Only)       Balance Overall balance assessment: Needs assistance   Sitting balance-Leahy Scale: Fair     Standing balance support: Bilateral upper extremity supported Standing balance-Leahy Scale: Poor Standing balance comment: dependent on RW due to L LE pain                            Cognition Arousal/Alertness: Awake/alert Behavior During Therapy: Flat affect                                          Exercises      General Comments        Pertinent Vitals/Pain Pain Assessment: 0-10 Pain Score: 7  Pain Location: L scrotum, radiating up into abdomen and down medial L thigh Pain Descriptors / Indicators: Stabbing Pain Intervention(s): Limited activity within patient's tolerance;Monitored during session;Premedicated before session;Repositioned    Home Living                      Prior Function            PT Goals (current goals can now be found in the care plan section) Acute Rehab PT Goals PT Goal Formulation: With patient Time For Goal Achievement: 03/10/18 Potential to Achieve Goals: Good Progress towards PT goals: Progressing toward goals    Frequency  Min 2X/week      PT Plan Current plan remains appropriate    Co-evaluation              AM-PAC PT "6 Clicks" Daily Activity  Outcome Measure  Difficulty turning over in bed (including adjusting bedclothes, sheets and blankets)?: Unable Difficulty moving from lying on back to sitting on the side of the bed? : Unable Difficulty sitting down on and standing up from a chair with arms (e.g., wheelchair, bedside commode, etc,.)?: Unable Help needed moving to and from a bed to chair (including a wheelchair)?: A Lot Help needed walking in hospital room?: A Lot Help needed climbing 3-5 steps with a railing? : A Lot 6 Click Score: 9    End of  Session Equipment Utilized During Treatment: Gait belt Activity Tolerance: Patient limited by pain;Patient limited by fatigue Patient left: in chair;with call bell/phone within reach;with chair alarm set;with nursing/sitter in room Nurse Communication: Mobility status PT Visit Diagnosis: Unsteadiness on feet (R26.81);Pain Pain - Right/Left: Left Pain - part of body: Leg     Time: 2080-2233 PT Time Calculation (min) (ACUTE ONLY): 27 min  Charges:  $Gait Training: 8-22 mins                    G Codes:       Reinaldo Berber, PT, DPT Acute Rehab Services Pager: [phone]     Reinaldo Berber 02/26/2018, 2:08 PM

## 2018-02-26 NOTE — Plan of Care (Signed)
  Problem: Elimination: Goal: Will not experience complications related to bowel motility Outcome: Progressing Note:  Has not had a bm in 5 days gave prune juice and miralax plan to get stimulate tablet Goal: Will not experience complications related to urinary retention Outcome: Progressing

## 2018-02-26 NOTE — Clinical Social Work Note (Addendum)
Per MD, patient can discharge back to SNF. Admissions coordinator will start insurance authorization.  Dayton Scrape, [place] [phone]  4:22 pm Insurance authorization still pending.  Dayton Scrape, [place]

## 2018-02-26 NOTE — Progress Notes (Signed)
[facility] for Infectious Disease  Date of Admission:  02/20/2018   Total days of antibiotics 6        Day 2 Zosyn          Patient ID: Michael [name] is a 67 y.o. male with  Principal Problem:   Epididymo-orchitis, acute Active Problems:   HIV (human immunodeficiency virus infection) ([place])   Uncontrolled type 2 diabetes with neuropathy ([place])   Hyperlipidemia with target LDL less than 100   Asthma, chronic   Cocaine use disorder, severe, dependence ([name])   CKD (chronic kidney disease) stage 3, GFR 30-59 ml/min ([name])   Gout   HCAP (healthcare-associated pneumonia)   Depression   Essential hypertension   Chest pain   . allopurinol  300 mg Oral Daily  . amantadine  100 mg Oral Daily  . aspirin  81 mg Oral Daily  . bisacodyl  5 mg Oral Once  . bisacodyl  10 mg Rectal Once  . colchicine  0.6 mg Oral Daily  . diltiazem  120 mg Oral Daily  . emtricitabine-rilpivir-tenofovir AF  1 tablet Oral Q breakfast  . enoxaparin (LOVENOX) injection  40 mg Subcutaneous Q24H  . famotidine  20 mg Oral Daily  . gabapentin  400 mg Oral BID  . insulin aspart  0-9 Units Subcutaneous TID WC  . insulin glargine  20 Units Subcutaneous QHS  . isosorbide mononitrate  30 mg Oral Daily  . polyethylene glycol  17 g Oral Daily  . pravastatin  40 mg Oral QHS  . senna  2 tablet Oral QHS  . sertraline  50 mg Oral Daily    SUBJECTIVE: Pain is much improved. He walked in the hallway today with physical therapy. He is pleased but does not quite understand what happened.   No Known Allergies  OBJECTIVE: Vitals:   02/25/18 1208 02/25/18 2046 02/26/18 0555 02/26/18 1119  BP: 110/78 123/84 (!) 140/95 (!) 134/92  Pulse: 90 92 88 100  Resp: 18 16 18 20   Temp: (!) 97.3 F (36.3 C) (!) 97 F (36.1 C) 98.1 F (36.7 C) 98.3 F (36.8 C)  TempSrc: Oral  Oral Oral  SpO2: 99% 100% 100% 100%  Weight:   171 lb 8 oz (77.8 kg)   Height:       Body mass index is 26.86 kg/m.  Physical  Exam  Constitutional: He is oriented to person, place, and time. He appears well-developed and well-nourished.  HENT:  Mouth/Throat: No oral lesions. Normal dentition. No dental caries.  Eyes: No scleral icterus.  Cardiovascular: Normal rate and normal heart sounds.  Pulmonary/Chest: Effort normal.  Abdominal: Soft. He exhibits no distension. There is no tenderness.  Genitourinary:  Genitourinary Comments: Left scrotum with only mild erythema today. Able to manipulate area without much tenderness. Still with some pain walking but more tolerable.   Neurological: He is alert and oriented to person, place, and time.  Skin: Skin is warm and dry. No rash noted.  Midsternal incision clean and dry. Open to air.   Vitals reviewed.   Lab Results Lab Results  Component Value Date   WBC 3.8 (L) 02/25/2018   HGB 8.6 (L) 02/25/2018   HCT 27.1 (L) 02/25/2018   MCV 81.4 02/25/2018   PLT 194 02/25/2018    Lab Results  Component Value Date   CREATININE 1.53 (H) 02/25/2018   BUN 24 (H) 02/25/2018   NA 135 02/25/2018   K 4.1 02/25/2018  CL 103 02/25/2018   CO2 24 02/25/2018    Lab Results  Component Value Date   ALT 15 (L) 02/13/2018   AST 22 02/13/2018   ALKPHOS 90 02/13/2018   BILITOT 0.6 02/13/2018     Microbiology: Recent Results (from the past 240 hour(s))  Culture, blood (Routine X 2) w Reflex to ID Panel     Status: None   Collection Time: 02/20/18 10:10 PM  Result Value Ref Range Status   Specimen Description BLOOD BLOOD RIGHT WRIST  Final   Special Requests   Final    BOTTLES DRAWN AEROBIC AND ANAEROBIC Blood Culture adequate volume   Culture   Final    NO GROWTH 5 DAYS Performed at [facility], [address], [place], [place] [address]    Report Status 02/25/2018 FINAL  Final  Culture, blood (Routine X 2) w Reflex to ID Panel     Status: None   Collection Time: 02/20/18 10:30 PM  Result Value Ref Range Status   Specimen Description BLOOD LEFT ANTECUBITAL   Final   Special Requests   Final    BOTTLES DRAWN AEROBIC AND ANAEROBIC Blood Culture adequate volume   Culture   Final    NO GROWTH 5 DAYS Performed at [facility], [place] [address], [place], [place] [address]    Report Status 02/25/2018 FINAL  Final  Urine Culture     Status: None   Collection Time: 02/21/18  2:22 AM  Result Value Ref Range Status   Specimen Description URINE, RANDOM  Final   Special Requests NONE  Final   Culture   Final    NO GROWTH Performed at [facility], [place] [address], [place], [place] [address]    Report Status 02/22/2018 FINAL  Final    ASSESSMENT: 67 y.o. male with well-controlled HIV presented with post-CABG pericarditis and scrotal pain. Has had serial ultrasounds considering lack of improvement with IV antibiotics. Added anaerobic coverage with Zosyn. Repeat U/S of left testicle last pm showing resolving epidydmo-orchitis with less swelling and induration. He continues to show good clinical improvement - would treat with zosyn one more day and transition to PO therapy with metronidazole and ciprofloxacin for 10 more days at discharge. D/C to SNF.   Recently treated for HCAP and showing residual infiltrate < 10d following treatment start for this. Asymptomatic and not likely currently contributing and is a resolving process. Sternal incision looks good.    He has pre-scheduled follow up with [name] to follow up on multiple hospital stays and recent bypass surgery on 03/03/18 @ 3:00 pm  PLAN: 1. Continue zosyn one more day 2. Would start PO Metronidazole 500 TID and PO Ciprofloxacin 500 mg BID for 10 more days at discharge.  3. Colchicine per primary team for pericarditis    Janene Madeira, MSN, NP-C [facility] for Infectious [place] Cell: [phone] Pager: [phone]  02/26/2018  2:54 PM

## 2018-02-26 NOTE — Progress Notes (Signed)
Report given at bedside with patient involvement denies pain or any concerns at this time plan to have vascular study of lower extremities.Marland Kitchen

## 2018-02-26 NOTE — Progress Notes (Signed)
Inpatient Diabetes Program Recommendations  AACE/ADA: New Consensus Statement on Inpatient Glycemic Control (2019)  Target Ranges:  Prepandial:   less than 140 mg/dL      Peak postprandial:   less than 180 mg/dL (1-2 hours)      Critically ill patients:  140 - 180 mg/dL  Results for Michael [name], Michael [name] (MRN [id number]) as of 02/26/2018 13:06  Ref. Range 02/25/2018 12:10 02/25/2018 17:19 02/25/2018 21:14 02/26/2018 07:43 02/26/2018 11:17  Glucose-Capillary Latest Ref Range: 65 - 99 mg/dL 317 (H) 140 (H) 128 (H) 120 (H) 331 (H)   Results for Michael [name], Michael [name] (MRN [id number]) as of 02/25/2018 09:48  Ref. Range 02/24/2018 07:43 02/24/2018 11:25 02/24/2018 11:48 02/24/2018 16:34 02/24/2018 17:01 02/24/2018 22:32 02/25/2018 07:27  Glucose-Capillary Latest Ref Range: 65 - 99 mg/dL 145 (H) 233 (H) 256 (H) 170 (H) 195 (H) 221 (H) 203 (H)   Results for Michael [name], Michael [name] (MRN [id number]) as of 02/24/2018 10:11  Ref. Range 02/23/2018 07:42 02/23/2018 11:28 02/23/2018 21:31 02/24/2018 07:43  Glucose-Capillary Latest Ref Range: 65 - 99 mg/dL 176 (H) 213 (H) 210 (H) 145 (H)   Review of Glycemic Control  Diabetes history: DM2 Outpatient Diabetes medications: Lantus 30 units QHS, Humalog 4 units TID, Metformin 1000 mg BID Current orders for Inpatient glycemic control: Lantus 20 units QHS, Novolog 0-9 units TID   Inpatient Diabetes Program Recommendations: Insulin - Meal Coverage: Please consider ordering Novolog 3 units TID wtih meals for meal coverage if patient eats at least 50% of meals. Insulin-Correction: Please consider ordering Novolog 0-5 units QHS for bedtime correction.  Thanks, Barnie Alderman, RN, MSN, CDE Diabetes Coordinator Inpatient Diabetes Program [phone] (Team Pager from 8am to 5pm)

## 2018-02-26 NOTE — Progress Notes (Signed)
Left lower extremity venous duplex completed. Preliminary results - There is no evidence of DVT, superficial thrombosis, or Bakers cyst. Michael [name],RVS  02/26/2018 12;00 PM

## 2018-02-26 NOTE — Progress Notes (Signed)
Patient is alert and oriented has not had bm in 6 days. Pending discharge back to [facility]

## 2018-02-26 NOTE — Discharge Summary (Addendum)
Physician Discharge Summary  [name] JKK:[id number] DOB: 1950-11-15 DOA: 02/20/2018  PCP: [name], PA-C  Admit date: 02/20/2018 Discharge date: 02/27/2018  Admitted From: SNF Disposition:  SNF  Recommendations for Outpatient Follow-up:  1. Follow up with PCP in 1-2 weeks 2. Follow up with Cardiologist within 1 week  3. Please obtain BMP/CBC in one week  Home Health: None  Equipment/Devices: None  Discharge Condition: Stable  CODE STATUS: Full Diet recommendation: Heart Healthy / Carb Modified  Brief/Interim Summary: Patient is a 67 y.o. male with recent history of CABG approximately 3 weeks back (4/25), HIV, DM-2 presented to the ED for evaluation of pleuritic chest pain and scrotal pain.  Thought to have post pericardiotomy syndrome as the etiology of chest pain, Doppler ultrasound consistent with orchitis/epididymitis.  Subsequently admitted for further evaluation and treatment.  See below for further details  Discharge Diagnoses:  Severe left-sided epididymo-orchitis: Slowly improving finally-continue Zosyn.  Appreciate infectious disease and urology input.  Scrotal ultrasound with Doppler done on 5/21 (third this admission) shows improvement as well. Urine GC probe negative.  Urine culture remains negative. Clinical improvement continues-Dr Ghimire d/w case with ID-Dr Snide-recommendations were to transition him to Cipro/Flagyl x 10 more days. 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  CT angiogram of the chest was negative for PE.  TTE showed preserved EF without any wall motion abnormalities.  No further recommendations from cardiology.    Healthcare associated pneumonia: CT angiogram of the chest shows lower lobe consolidation-suspect this may be a residual of his most his pneumonia-he is currently covered with antibiotics which we are primarily  using for orchitis.  Blood cultures negative so far.    CAD status post CABG April 2019: See above regarding chest pain-continue aspirin, statin   Acute kidney injury chronic kidney disease stage III: Creatinine improving and now back to usual baseline.  Continue to hold diuretics and Cozaar for now.      Hypertension: Controlled with just Cardizem and Imdur.  Cozaar and HCTZ discontinued upon discharge    HIV: Continue with antiretrovirals, last CD4 count January 2019 was 630.  Depression: Stable-continue Zoloft   Discharge Instructions  Check CBG's ac and hs  Discharge Instructions    Call MD for:  persistant nausea and vomiting   Complete by:  As directed    Call MD for:  severe uncontrolled pain   Complete by:  As directed    Call MD for:  temperature >100.4   Complete by:  As directed    Diet - low sodium heart healthy   Complete by:  As directed    Diet Carb Modified   Complete by:  As directed    Increase activity slowly   Complete by:  As directed      Allergies as of 02/27/2018   No Known Allergies     Medication List    STOP taking these medications   amoxicillin-clavulanate 875-125 MG tablet Commonly known as:  AUGMENTIN   hydrochlorothiazide 12.5 MG capsule Commonly known as:  MICROZIDE   losartan 50 MG tablet Commonly known as:  COZAAR     TAKE these medications   ACCU-CHEK SOFTCLIX LANCETS lancets USE TO CHECK BLOOD GLUCOSE LEVELS UP TO 4 TIMES DAILY AS DIRECTED   albuterol 108 (90 Base) MCG/ACT inhaler Commonly known as:  PROAIR HFA INHALE TWO PUFFS INTO THE LUNGS EVERY 6 (SIX) HOURS AS  NEEDED FOR WHEEZING OR SHORTNESS OF BREATH. What changed:  Another medication with the same name was added. Make sure you understand how and when to take each.   PROAIR HFA 108 (90 Base) MCG/ACT inhaler Generic drug:  albuterol INHALE TWO PUFFS INTO THE LUNGS EVERY 6 (SIX) HOURS AS NEEDED FOR WHEEZING OR SHORTNESS OF BREATH. What changed:  You were  already taking a medication with the same name, and this prescription was added. Make sure you understand how and when to take each.   allopurinol 300 MG tablet Commonly known as:  ZYLOPRIM Take 1 tablet (300 mg total) by mouth daily. For gout.   amantadine 100 MG capsule Commonly known as:  SYMMETREL Take 100 mg by mouth daily.   aspirin 81 MG chewable tablet Chew 81 mg by mouth daily.   ciprofloxacin 500 MG tablet Commonly known as:  CIPRO Take 1 tablet (500 mg total) by mouth 2 (two) times daily for 10 days. 10 days from 5/24   colchicine 0.6 MG tablet Take 1 tablet (0.6 mg total) by mouth daily.   diltiazem 120 MG 24 hr capsule Commonly known as:  CARDIZEM CD Take 1 capsule (120 mg total) by mouth daily. For high blood pressure   emtricitabine-rilpivir-tenofovir AF 200-25-25 MG Tabs tablet Commonly known as:  ODEFSEY Take 1 tablet by mouth daily with breakfast. For HIV infection   famotidine 20 MG tablet Commonly known as:  PEPCID Take 1 tablet (20 mg total) by mouth daily. For acid reflux   gabapentin 400 MG capsule Commonly known as:  NEURONTIN Take 1 capsule (400 mg total) by mouth 2 (two) times daily. For agitation/neuropathic pain What changed:    when to take this  reasons to take this  additional instructions   hydrOXYzine 25 MG tablet Commonly known as:  ATARAX/VISTARIL Take 1 tablet (25 mg total) by mouth 3 (three) times daily as needed for anxiety.   insulin glargine 100 UNIT/ML injection Commonly known as:  LANTUS Inject 0.3 mLs (30 Units total) into the skin at bedtime. For diabetes management   insulin lispro 100 UNIT/ML injection Commonly known as:  HUMALOG Inject 0.05 mLs (5 Units total) into the skin 3 (three) times daily with meals. For diabetes management What changed:    how much to take  additional instructions   Insulin Syringe-Needle U-100 27G X 5/8" 1 ML Misc Commonly known as:  B-D INS SYR MICROFINE 1CC/27G 1 strip by Does not  apply route 3 (three) times daily as needed. For blood sugar checks   isosorbide mononitrate 30 MG 24 hr tablet Commonly known as:  IMDUR Take 2 tablets (60 mg total) by mouth daily. For chest What changed:  how much to take   metFORMIN 1000 MG tablet Commonly known as:  GLUCOPHAGE Take 1 tablet (1,000 mg total) by mouth 2 (two) times daily with a meal. For diabetes management   metroNIDAZOLE 500 MG tablet Commonly known as:  FLAGYL Take 1 tablet (500 mg total) by mouth 3 (three) times daily for 14 days. 10 days from 5/24   oxyCODONE 5 MG immediate release tablet Commonly known as:  Oxy IR/ROXICODONE Take 1 tablet (5 mg total) by mouth every 6 (six) hours as needed for moderate pain.   polyethylene glycol packet Commonly known as:  MIRALAX / GLYCOLAX Take 17 g by mouth daily.   pravastatin 40 MG tablet Commonly known as:  PRAVACHOL Take 1 tablet (40 mg total) by mouth at bedtime. For high cholesterol  senna 8.6 MG Tabs tablet Commonly known as:  SENOKOT Take 2 tablets (17.2 mg total) by mouth at bedtime as needed for mild constipation.   sertraline 50 MG tablet Commonly known as:  ZOLOFT Take 1 tablet (50 mg total) by mouth daily. For depression   traZODone 50 MG tablet Commonly known as:  DESYREL Take 1 tablet (50 mg total) by mouth at bedtime as needed for sleep.      Follow-up Information    [name], PA-C. Schedule an appointment as soon as possible for a visit in 1 week(s).   Specialty:  Physician Assistant Contact information: [place] [address] [phone]        [name], MD Follow up on 03/03/2018.   Specialty:  Infectious Diseases Why:  3:00 pm appointment.  Contact information: [place] [address] [phone]        Robynn Pane, MD Follow up in 5 day(s).   Specialty:  Thoracic Diseases Contact information: [place] [address] [phone]           No Known Allergies  Consultations:  Cardiology  Urology  Infectious Disease    Procedures/Studies: Dg Chest 2 View  Result Date: 02/20/2018 CLINICAL DATA:  Sudden onset sharp chest pain, short of breath EXAM: CHEST - 2 VIEW COMPARISON:  Chest radiograph 04/01/17 FINDINGS: Sternotomy wires overlie normal cardiac silhouette. There is elevation of the RIGHT hemidiaphragm which is similar to the recent CT and new from chest x-ray of 04/01/2017. RIGHT lower lobe consolidations similar to comparison CT. LEFT lung clear. IMPRESSION: Persistent RIGHT lower lobe consolidation. Recommend follow-up CT with contrast to evaluate after appropriate therapy (3-4 weeks). Persistent elevation of the RIGHT hemidiaphragm new from radiograph 04/01/2017 Electronically Signed   By: Suzy Bouchard M.D.   On: 02/20/2018 14:34   Dg Shoulder Right  Result Date: 02/15/2018 CLINICAL DATA:  Acute RIGHT shoulder pain for 4 days. No known injury. Initial encounter. EXAM: RIGHT SHOULDER - 2+ VIEW COMPARISON:  None. FINDINGS: There is no evidence of fracture or dislocation. There is no evidence of arthropathy or other focal bone abnormality. Soft tissues are unremarkable. IMPRESSION: Negative. Electronically Signed   By: Margarette Canada M.D.   On: 02/15/2018 17:53   Ct Angio Chest Pe W And/or Wo Contrast  Result Date: 02/20/2018 CLINICAL DATA:  LEFT-sided chest pain radiating to RIGHT-sided chest, starting today. PE suspected, intermediate probability, positive D-dimer. EXAM: CT ANGIOGRAPHY CHEST WITH CONTRAST TECHNIQUE: Multidetector CT imaging of the chest was performed using the standard protocol during bolus administration of intravenous contrast. Multiplanar CT image reconstructions and MIPs were obtained to evaluate the vascular anatomy. CONTRAST:  49mL ISOVUE-370 IOPAMIDOL (ISOVUE-370) INJECTION 76% COMPARISON:  Chest CT angiogram dated 02/13/2018. Chest CT angiogram dated 04/02/2017. FINDINGS: Cardiovascular:  Evaluation of the peripheral segmental and subsegmental pulmonary artery branches to the LEFT lower lobe is limited due to patient breathing motion artifact, however, there is no pulmonary embolism identified within the main, lobar or central segmental pulmonary arteries to the LEFT lung. There is no pulmonary embolism identified within the main, lobar or segmental pulmonary arteries to the RIGHT lung. Heart size is within normal limits. No pericardial effusion. No thoracic aortic aneurysm. Scattered mild aortic atherosclerosis. Status post median sternotomy for presumed CABG. Mediastinum/Nodes: No mass or enlarged lymph nodes seen within the mediastinum or perihilar regions. Esophagus appears normal. Trachea and central bronchi are unremarkable. Lungs/Pleura: Persistent dense consolidation within  the RIGHT lower lobe, posterior aspect, not appreciably changed compared to the most recent chest CT angiogram of 02/13/2018, new compared to the earlier chest CT of 04/02/2017. No new lung abnormality.  No pleural effusion or pneumothorax. Upper Abdomen: Limited images of the upper abdomen are unremarkable. Musculoskeletal: Mild degenerative spondylosis throughout the mid and lower thoracic spine. No acute or suspicious osseous finding. Review of the MIP images confirms the above findings. IMPRESSION: 1. No pulmonary embolism seen, with mild study limitations detailed above. 2. Persistent dense consolidation within the RIGHT lower lobe, stable, most likely pneumonia. Consider follow-up chest CT in 4-6 weeks to ensure resolution. Aortic Atherosclerosis (ICD10-I70.0). Electronically Signed   By: Franki Cabot M.D.   On: 02/20/2018 18:43   Ct Angio Chest Pe W And/or Wo Contrast  Result Date: 02/13/2018 CLINICAL DATA:  67 year old male with shortness of breath. EXAM: CT ANGIOGRAPHY CHEST WITH CONTRAST TECHNIQUE: Multidetector CT imaging of the chest was performed using the standard protocol during bolus administration of  intravenous contrast. Multiplanar CT image reconstructions and MIPs were obtained to evaluate the vascular anatomy. CONTRAST:  116mL ISOVUE-370 IOPAMIDOL (ISOVUE-370) INJECTION 76% COMPARISON:  Chest radiograph dated 04/01/2017 and CT dated 04/02/2017 FINDINGS: Cardiovascular: There is no cardiomegaly or pericardial effusion. Multi vessel coronary vascular calcification and postsurgical changes of CABG. Mild anterior mediastinal/retrosternal stranding related to recent surgery. No fluid collection. There is mild atherosclerotic calcification of the thoracic aorta. No aneurysmal dilatation or dissection. Evaluation of the pulmonary arteries is limited due to respiratory motion artifact. No pulmonary artery embolus identified. Mediastinum/Nodes: There is no hilar or mediastinal adenopathy. Esophagus and the thyroid gland are grossly unremarkable. No mediastinal fluid collection. Lungs/Pleura: There is an area of consolidative change with air bronchogram in the right lower lobe. Focal area of hazy and ground-glass density in the medial basal segment of the left lower lobe noted which may represent atelectasis/scarring versus infiltrate although an area of infarct is not excluded. There is no pleural effusion or pneumothorax. The central airways are patent. Upper Abdomen: No acute abnormality. Musculoskeletal: Median sternotomy wires. Degenerative changes of the spine. No acute osseous pathology Review of the MIP images confirms the above findings. IMPRESSION: 1. No CT evidence of pulmonary embolism. 2. Consolidative changes of the right lower lobe most consistent with pneumonia. Clinical correlation and follow-up to resolution recommended. Smaller focal ground-glass density at the left lung base may also represent infection. 3. Multi vessel coronary vascular calcification and postsurgical changes of CABG. No mediastinal fluid collection. Electronically Signed   By: Anner Crete M.D.   On: 02/13/2018 23:28   Ct  Pelvis Wo Contrast  Result Date: 02/22/2018 CLINICAL DATA:  Follow-up epididymitis orchitis. Assess for free air. EXAM: CT PELVIS WITHOUT CONTRAST TECHNIQUE: Multidetector CT imaging of the pelvis was performed following the standard protocol without intravenous contrast. COMPARISON:  Scrotal ultrasound Feb 20, 2018 and CT abdomen and pelvis September 21, 2015 FINDINGS: Urinary Tract:  Well distended, normal. Bowel: Moderate amount of retained large bowel stool. Included small and large bowel are normal in course and caliber. Vascular/Lymphatic: Included aortoiliac vessels are normal in course and caliber, mild calcific atherosclerosis Reproductive:  Normal. Other: Partially imaged thickened scrotum. Please note, scrotum has not been fully imaged. Partially imaged perineum is unremarkable. Small amount of free fluid in the LEFT inguinal canal. No intraperitoneal free fluid or free air in the pelvis. Small fat containing umbilical hernia. Phleboliths in the pelvis. Musculoskeletal: Nonacute. Status post L5-S1 PLIF with arthrodesis, intact laminar hooks  and cerclage wires. Severe canal stenosis L4-5, severe L4-5 neural foraminal narrowing. IMPRESSION: 1. Scrotum and perineum incompletely imaged. No subcutaneous emphysema with included pelvis. 2. L5-S1 PLIF, adjacent segment disease with severe L4-5 canal stenosis and severe L4-5 neural foraminal narrowing. Aortic Atherosclerosis (ICD10-I70.0). Electronically Signed   By: Elon Alas M.D.   On: 02/22/2018 16:02   US Scrotum  Result Date: 02/20/2018 CLINICAL DATA:  67 year old male with acute LEFT testicular pain for 3 days. EXAM: SCROTAL ULTRASOUND DOPPLER ULTRASOUND OF THE TESTICLES TECHNIQUE: Complete ultrasound examination of the testicles, epididymis, and other scrotal structures was performed. Color and spectral Doppler ultrasound were also utilized to evaluate blood flow to the testicles. COMPARISON:  None. FINDINGS: Right testicle Not identified  within the scrotal sac or RIGHT inguinal region. Left testicle Measurements: 3.2 x 2.1 x 2.4 cm with increased vascularity. No mass or microlithiasis visualized. Right epididymis:  Not visualized Left epididymis:  Enlarged and hypervascular. Hydrocele:  Small LEFT hydrocele noted. Varicocele:  Mild LEFT varicocele noted Pulsed Doppler interrogation of the LEFT testicle demonstrates normal low resistance arterial and venous waveforms. IMPRESSION: 1. LEFT epididymo-orchitis with small LEFT hydrocele. No abscess or testicular torsion. 2. RIGHT testicle not identified within the scrotal sac or RIGHT inguinal region. Correlate with history. Electronically Signed   By: Margarette Canada M.D.   On: 02/20/2018 19:10   US Scrotum Doppler  Result Date: 02/20/2018 CLINICAL DATA:  67 year old male with acute LEFT testicular pain for 3 days. EXAM: SCROTAL ULTRASOUND DOPPLER ULTRASOUND OF THE TESTICLES TECHNIQUE: Complete ultrasound examination of the testicles, epididymis, and other scrotal structures was performed. Color and spectral Doppler ultrasound were also utilized to evaluate blood flow to the testicles. COMPARISON:  None. FINDINGS: Right testicle Not identified within the scrotal sac or RIGHT inguinal region. Left testicle Measurements: 3.2 x 2.1 x 2.4 cm with increased vascularity. No mass or microlithiasis visualized. Right epididymis:  Not visualized Left epididymis:  Enlarged and hypervascular. Hydrocele:  Small LEFT hydrocele noted. Varicocele:  Mild LEFT varicocele noted Pulsed Doppler interrogation of the LEFT testicle demonstrates normal low resistance arterial and venous waveforms. IMPRESSION: 1. LEFT epididymo-orchitis with small LEFT hydrocele. No abscess or testicular torsion. 2. RIGHT testicle not identified within the scrotal sac or RIGHT inguinal region. Correlate with history. Electronically Signed   By: Margarette Canada M.D.   On: 02/20/2018 19:10   US Scrotum W/doppler  Result Date: 02/24/2018 CLINICAL  DATA:  Follow-up epididymal orchitis EXAM: SCROTAL ULTRASOUND DOPPLER ULTRASOUND OF THE TESTICLES TECHNIQUE: Complete ultrasound examination of the testicles, epididymis, and other scrotal structures was performed. Color and spectral Doppler ultrasound were also utilized to evaluate blood flow to the testicles. COMPARISON:  02/22/2018, 02/20/2018, pelvic CT 02/22/2018 FINDINGS: Right testicle Surgically absent Left testicle Measurements: 2.8 x 1.5 x 2.4 cm. No mass or microlithiasis visualized. Surgically absent Left epididymis: Normal in size and appearance. Today it measures 6.7 x 4.3 x 6.4 mm, compared with 2.3 x 7.1 mm previously. Hydrocele:  Small left hydrocele Varicocele:  None visualized. Pulsed Doppler interrogation of both testes demonstrates normal low resistance arterial and venous waveforms bilaterally. IMPRESSION: 1. Decreased edema and hyperemia of the left epididymis and testis, consistent with resolving epididymal orchitis. Small left hydrocele 2. Surgically absent right testis Electronically Signed   By: Donavan Foil M.D.   On: 02/24/2018 23:51   US Scrotum W/doppler  Result Date: 02/22/2018 CLINICAL DATA:  Left-sided pelvic pain EXAM: SCROTAL ULTRASOUND DOPPLER ULTRASOUND OF THE TESTICLES TECHNIQUE: Complete ultrasound examination of  the testicles, epididymis, and other scrotal structures was performed. Color and spectral Doppler ultrasound were also utilized to evaluate blood flow to the testicles. COMPARISON:  Two days ago FINDINGS: Right testicle Surgically absent, reportedly due to trauma in the 1980s Left testicle Measurements: 32 x 24 x 25 mm. Hypervascularity. No collection or heterogeneity. Right epididymis:  Surgically absent Left epididymis: Masslike thickening with hypervascularity that has progressed from prior. There are multiple echogenic foci with possible mild shadowing, increased from prior. Hydrocele:  Negative for  pyocele Varicocele:  Not assessed in this setting Pulsed  Doppler interrogation of the left testicle demonstrates normal low resistance arterial and venous waveforms. These results were called by telephone at the time of interpretation on 02/22/2018 at 1:41 pm to Dr. Oren Binet. IMPRESSION: 1. Left epididymo-orchitis with worsening swelling from 2 days ago. There are multiple echogenic foci within the thickened epididymis, emphysematous epididymitis is a consideration in this immunocompromised diabetic. Noncontrast pelvis CT could further evaluate. 2. No abscess or pyocele. 3. Remote right orchiectomy. Electronically Signed   By: Monte Fantasia M.D.   On: 02/22/2018 13:41     Subjective: Resting in bed upon assessment with improving left testicular pain today.  No pain in the adjoining left thigh and pelvic area.  Discharge Exam: Vitals:   02/26/18 2310 02/27/18 0439  BP: (!) 141/97 (!) 149/100  Pulse: 89 90  Resp: 18 18  Temp: 98.4 F (36.9 C) 98.4 F (36.9 C)  SpO2: 98% 99%   Vitals:   02/26/18 2250 02/26/18 2310 02/27/18 0400 02/27/18 0439  BP: (!) 148/97 (!) 141/97  (!) 149/100  Pulse: 89 89  90  Resp:  18  18  Temp:  98.4 F (36.9 C)  98.4 F (36.9 C)  TempSrc:  Oral  Oral  SpO2:  98%  99%  Weight:   77.8 kg (171 lb 8 oz)   Height:        General: Pt is alert, awake, not in acute distress Cardiovascular: RRR, S1/S2 +, no rubs, no gallops Respiratory: CTA bilaterally, no wheezing, no rhonchi Abdominal: Soft, NT, ND, bowel sounds + Extremities: no edema, no cyanosis    The results of significant diagnostics from this hospitalization (including imaging, microbiology, ancillary and laboratory) are listed below for reference.     Microbiology: Recent Results (from the past 240 hour(s))  Culture, blood (Routine X 2) w Reflex to ID Panel     Status: None   Collection Time: 02/20/18 10:10 PM  Result Value Ref Range Status   Specimen Description BLOOD BLOOD RIGHT WRIST  Final   Special Requests   Final    BOTTLES DRAWN  AEROBIC AND ANAEROBIC Blood Culture adequate volume   Culture   Final    NO GROWTH 5 DAYS Performed at [facility], [address] [place], [place] [address]    Report Status 02/25/2018 FINAL  Final  Culture, blood (Routine X 2) w Reflex to ID Panel     Status: None   Collection Time: 02/20/18 10:30 PM  Result Value Ref Range Status   Specimen Description BLOOD LEFT ANTECUBITAL  Final   Special Requests   Final    BOTTLES DRAWN AEROBIC AND ANAEROBIC Blood Culture adequate volume   Culture   Final    NO GROWTH 5 DAYS Performed at [facility], [place] [address], [place], [place] [address]    Report Status 02/25/2018 FINAL  Final  Urine Culture     Status: None  Collection Time: 02/21/18  2:22 AM  Result Value Ref Range Status   Specimen Description URINE, RANDOM  Final   Special Requests NONE  Final   Culture   Final    NO GROWTH Performed at [facility], [address], [place], [place] [address]    Report Status 02/22/2018 FINAL  Final     Labs: BNP (last 3 results) Recent Labs    02/20/18 1318  BNP 37.8   Basic Metabolic Panel: Recent Labs  Lab 02/20/18 1318 02/22/18 0715 02/23/18 0725 02/24/18 0550 02/25/18 0509  NA 137 131* 132* 134* 135  K 4.5 4.2 4.0 3.9 4.1  CL 102 96* 97* 102 103  CO2 24 23 23 23 24   GLUCOSE 152* 105* 182* 162* 268*  BUN 23* 17 26* 28* 24*  CREATININE 1.78* 1.68* 2.03* 1.72* 1.53*  CALCIUM 9.3 9.2 9.0 8.9 8.8*   Liver Function Tests: No results for input(s): AST, ALT, ALKPHOS, BILITOT, PROT, ALBUMIN in the last 168 hours. No results for input(s): LIPASE, AMYLASE in the last 168 hours. No results for input(s): AMMONIA in the last 168 hours. CBC: Recent Labs  Lab 02/20/18 1318 02/22/18 0715 02/23/18 0725 02/24/18 0550 02/25/18 0509  WBC 8.2 10.5 8.0 5.1 3.8*  HGB 10.0* 10.0* 9.5* 9.2* 8.6*  HCT 31.1* 31.7* 30.0* 29.0* 27.1*  MCV 82.3 80.9 80.6 80.8 81.4  PLT 259 213 197 195 194   Cardiac Enzymes: Recent  Labs  Lab 02/21/18 0350 02/21/18 0928  TROPONINI 0.09* 0.10*   BNP: Invalid input(s): POCBNP CBG: Recent Labs  Lab 02/25/18 2114 02/26/18 0743 02/26/18 1117 02/26/18 1609 02/26/18 2109  GLUCAP 128* 120* 331* 316* 241*   D-Dimer No results for input(s): DDIMER in the last 72 hours. Hgb A1c No results for input(s): HGBA1C in the last 72 hours. Lipid Profile No results for input(s): CHOL, HDL, LDLCALC, TRIG, CHOLHDL, LDLDIRECT in the last 72 hours. Thyroid function studies No results for input(s): TSH, T4TOTAL, T3FREE, THYROIDAB in the last 72 hours.  Invalid input(s): FREET3 Anemia work up No results for input(s): VITAMINB12, FOLATE, FERRITIN, TIBC, IRON, RETICCTPCT in the last 72 hours. Urinalysis    Component Value Date/Time   COLORURINE YELLOW 02/20/2018 1733   APPEARANCEUR HAZY (A) 02/20/2018 1733   LABSPEC 1.023 02/20/2018 1733   PHURINE 6.0 02/20/2018 1733   GLUCOSEU 50 (A) 02/20/2018 1733   HGBUR NEGATIVE 02/20/2018 1733   BILIRUBINUR NEGATIVE 02/20/2018 1733   KETONESUR NEGATIVE 02/20/2018 1733   PROTEINUR 100 (A) 02/20/2018 1733   UROBILINOGEN 0.2 08/13/2015 1030   NITRITE NEGATIVE 02/20/2018 1733   LEUKOCYTESUR SMALL (A) 02/20/2018 1733   Sepsis Labs Invalid input(s): PROCALCITONIN,  WBC,  LACTICIDVEN Microbiology Recent Results (from the past 240 hour(s))  Culture, blood (Routine X 2) w Reflex to ID Panel     Status: None   Collection Time: 02/20/18 10:10 PM  Result Value Ref Range Status   Specimen Description BLOOD BLOOD RIGHT WRIST  Final   Special Requests   Final    BOTTLES DRAWN AEROBIC AND ANAEROBIC Blood Culture adequate volume   Culture   Final    NO GROWTH 5 DAYS Performed at [facility], [place] [address], [place],  [address]    Report Status 02/25/2018 FINAL  Final  Culture, blood (Routine X 2) w Reflex to ID Panel     Status: None   Collection Time: 02/20/18 10:30 PM  Result Value Ref Range Status   Specimen Description  BLOOD LEFT ANTECUBITAL  Final   Special Requests   Final    BOTTLES DRAWN AEROBIC AND ANAEROBIC Blood Culture adequate volume   Culture   Final    NO GROWTH 5 DAYS Performed at [facility], [address] [place], [place] [address]    Report Status 02/25/2018 FINAL  Final  Urine Culture     Status: None   Collection Time: 02/21/18  2:22 AM  Result Value Ref Range Status   Specimen Description URINE, RANDOM  Final   Special Requests NONE  Final   Culture   Final    NO GROWTH Performed at [facility], [place] [address] [place], [place] [address]    Report Status 02/22/2018 FINAL  Final     Time coordinating discharge: 35 minutes  SIGNED:   Johnsie Cancel, NP [facility]   If 7PM-7AM, please contact night-coverage [email or web address] Password [facility] 02/26/2018, 10:22 AM    Attending MD note  Patient was seen, examined,treatment plan was discussed with the NP.  I have personally reviewed the clinical findings, lab, imaging studies and management of this patient in detail. I agree with the documentation, as recorded by the NP.   Subjective: Pain in his left testicle continues to improve-much better today-actually lets me palpate his scrotal area much more today compared to this the past few days.  On Exam: Vitals:   02/26/18 2250 02/26/18 2310 02/27/18 0400 02/27/18 0439  BP: (!) 148/97 (!) 141/97  (!) 149/100  Pulse: 89 89  90  Resp:  18  18  Temp:  98.4 F (36.9 C)  98.4 F (36.9 C)  TempSrc:  Oral  Oral  SpO2:  98%  99%  Weight:   77.8 kg (171 lb 8 oz)   Height:       Gen. exam: Awake, alert, not in any distress Chest: Good air entry bilaterally, no rhonchi or rales CVS: S1-S2 regular, no murmurs Abdomen: Soft, nontender and nondistended.  Left testicle significantly less tender-although with still some amount of tenderness but markedly better than yesterday.  Do not see any major swelling.  No tenderness at his left groin and left pelvis  area. Neurology: Non-focal Skin: No rash or lesions   Lab Data: CBC: Recent Labs  Lab 02/20/18 1318 02/22/18 0715 02/23/18 0725 02/24/18 0550 02/25/18 0509  WBC 8.2 10.5 8.0 5.1 3.8*  HGB 10.0* 10.0* 9.5* 9.2* 8.6*  HCT 31.1* 31.7* 30.0* 29.0* 27.1*  MCV 82.3 80.9 80.6 80.8 81.4  PLT 259 213 197 195 527    Basic Metabolic Panel: Recent Labs  Lab 02/20/18 1318 02/22/18 0715 02/23/18 0725 02/24/18 0550 02/25/18 0509  NA 137 131* 132* 134* 135  K 4.5 4.2 4.0 3.9 4.1  CL 102 96* 97* 102 103  CO2 24 23 23 23 24   GLUCOSE 152* 105* 182* 162* 268*  BUN 23* 17 26* 28* 24*  CREATININE 1.78* 1.68* 2.03* 1.72* 1.53*  CALCIUM 9.3 9.2 9.0 8.9 8.8*    Assessment and plan: Severe orchitis: Markedly improved today-afebrile-claims that he is now able to stand up and put weight on the left lower extremity-which he could not do a few days back.  Doppler of the left lower extremity also negative.  Since clinically improved-spoke with infectious disease-Dr. 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  CABG status post CABG in April 2019: Continue aspirin-statin and follow-up with cardiothoracic surgeon in [facility].  Acute kidney injury: Resolved-has chronic kidney disease stage III at baseline-creatinine back to usual baseline.  Hypertension: Controlled with Cardizem and indoor.  Continue to hold Cozaar and HCTZ on discharge.  HIV: Continue antiretrovirals.  Disposition: Stable to be discharged to SNF when bed available  Total time spent 35 minutes   Rest as above  [facility]

## 2018-02-27 ENCOUNTER — Other Ambulatory Visit: Payer: Self-pay

## 2018-02-27 DIAGNOSIS — R071 Chest pain on breathing: Secondary | ICD-10-CM | POA: Diagnosis not present

## 2018-02-27 DIAGNOSIS — I2582 Chronic total occlusion of coronary artery: Secondary | ICD-10-CM | POA: Diagnosis not present

## 2018-02-27 DIAGNOSIS — Z743 Need for continuous supervision: Secondary | ICD-10-CM | POA: Diagnosis not present

## 2018-02-27 DIAGNOSIS — N453 Epididymo-orchitis: Secondary | ICD-10-CM | POA: Diagnosis not present

## 2018-02-27 DIAGNOSIS — R278 Other lack of coordination: Secondary | ICD-10-CM | POA: Diagnosis not present

## 2018-02-27 DIAGNOSIS — I10 Essential (primary) hypertension: Secondary | ICD-10-CM | POA: Diagnosis not present

## 2018-02-27 DIAGNOSIS — E138 Other specified diabetes mellitus with unspecified complications: Secondary | ICD-10-CM | POA: Diagnosis not present

## 2018-02-27 DIAGNOSIS — Z951 Presence of aortocoronary bypass graft: Secondary | ICD-10-CM | POA: Diagnosis not present

## 2018-02-27 DIAGNOSIS — R112 Nausea with vomiting, unspecified: Secondary | ICD-10-CM | POA: Diagnosis not present

## 2018-02-27 DIAGNOSIS — M6281 Muscle weakness (generalized): Secondary | ICD-10-CM | POA: Diagnosis not present

## 2018-02-27 DIAGNOSIS — R197 Diarrhea, unspecified: Secondary | ICD-10-CM | POA: Diagnosis not present

## 2018-02-27 DIAGNOSIS — N451 Epididymitis: Secondary | ICD-10-CM | POA: Diagnosis not present

## 2018-02-27 DIAGNOSIS — K219 Gastro-esophageal reflux disease without esophagitis: Secondary | ICD-10-CM | POA: Diagnosis not present

## 2018-02-27 DIAGNOSIS — Z5189 Encounter for other specified aftercare: Secondary | ICD-10-CM | POA: Diagnosis not present

## 2018-02-27 DIAGNOSIS — R279 Unspecified lack of coordination: Secondary | ICD-10-CM | POA: Diagnosis not present

## 2018-02-27 DIAGNOSIS — E785 Hyperlipidemia, unspecified: Secondary | ICD-10-CM | POA: Diagnosis not present

## 2018-02-27 DIAGNOSIS — J189 Pneumonia, unspecified organism: Secondary | ICD-10-CM | POA: Diagnosis not present

## 2018-02-27 DIAGNOSIS — N179 Acute kidney failure, unspecified: Secondary | ICD-10-CM | POA: Diagnosis not present

## 2018-02-27 DIAGNOSIS — E139 Other specified diabetes mellitus without complications: Secondary | ICD-10-CM | POA: Diagnosis not present

## 2018-02-27 DIAGNOSIS — M792 Neuralgia and neuritis, unspecified: Secondary | ICD-10-CM | POA: Diagnosis not present

## 2018-02-27 LAB — GLUCOSE, CAPILLARY: GLUCOSE-CAPILLARY: 80 mg/dL (ref 65–99)

## 2018-02-27 MED ORDER — METRONIDAZOLE 500 MG PO TABS: 500.0000 mg | ORAL_TABLET | Freq: Three times a day (TID) | ORAL | 0 refills | Status: AC

## 2018-02-27 MED ORDER — CIPROFLOXACIN HCL 500 MG PO TABS: 500.0000 mg | ORAL_TABLET | Freq: Two times a day (BID) | ORAL | 0 refills | Status: AC

## 2018-02-27 NOTE — Progress Notes (Signed)
Seen and examined Afebrile Left testicular pain has improved further Ambulated yesterday-now able to bear weight on left leg without pain  Exam: Significant decrease in left testicle tenderness-acutally allows me to palpate more today THAN the past few days  Imp: Severe orchitis-improving rapidly  Plan Switch to Cipro/Flagyl x 10 days D/c to SNF

## 2018-02-27 NOTE — Progress Notes (Signed)
pt is transported to [facility] tele off and Report given to Berton Lan LPN at [address].

## 2018-02-27 NOTE — Plan of Care (Signed)
  Problem: Pain Managment: Goal: General experience of comfort will improve Outcome: Not Progressing Note:  Left groining and leg pain Prm given around  the clock

## 2018-02-27 NOTE — Clinical Social Work Note (Signed)
Insurance authorization approved. SNF requesting MD update discharge date on summary to today. CSW paged MD to notify.  [place] Scrape, [place]

## 2018-02-27 NOTE — Clinical Social Work Note (Signed)
CSW facilitated patient discharge including contacting patient family (Brother at bedside) and facility to confirm patient discharge plans. Clinical information faxed to facility and family agreeable with plan. CSW arranged ambulance transport via [facility] to [facility] RN to call report prior to discharge [phone]).  CSW will sign off for now as social work intervention is no longer needed. Please consult [place] again if new needs arise.  [place] Scrape, [place]

## 2018-03-02 DIAGNOSIS — N50812 Left testicular pain: Secondary | ICD-10-CM

## 2018-03-03 ENCOUNTER — Ambulatory Visit: Payer: Self-pay | Admitting: Infectious Diseases

## 2018-03-03 DIAGNOSIS — R112 Nausea with vomiting, unspecified: Secondary | ICD-10-CM | POA: Diagnosis not present

## 2018-03-03 DIAGNOSIS — I10 Essential (primary) hypertension: Secondary | ICD-10-CM | POA: Diagnosis not present

## 2018-03-03 DIAGNOSIS — R197 Diarrhea, unspecified: Secondary | ICD-10-CM | POA: Diagnosis not present

## 2018-03-03 DIAGNOSIS — N453 Epididymo-orchitis: Secondary | ICD-10-CM | POA: Diagnosis not present

## 2018-03-04 ENCOUNTER — Other Ambulatory Visit: Payer: Self-pay | Admitting: *Deleted

## 2018-03-04 ENCOUNTER — Other Ambulatory Visit: Payer: Self-pay | Admitting: Licensed Clinical Social Worker

## 2018-03-04 NOTE — Patient Outreach (Signed)
[facility]) Care Management  [facility] Social Work  03/04/2018  Michael [name] 01/24/1951 [id number]  Subjective:    Objective:   Encounter Medications:  Outpatient Encounter Medications as of 03/04/2018  Medication Sig  . ACCU-CHEK SOFTCLIX LANCETS lancets USE TO CHECK BLOOD GLUCOSE LEVELS UP TO 4 TIMES DAILY AS DIRECTED  . albuterol (PROAIR HFA) 108 (90 Base) MCG/ACT inhaler INHALE TWO PUFFS INTO THE LUNGS EVERY 6 (SIX) HOURS AS NEEDED FOR WHEEZING OR SHORTNESS OF BREATH.  Marland Kitchen allopurinol (ZYLOPRIM) 300 MG tablet Take 1 tablet (300 mg total) by mouth daily. For gout.  Marland Kitchen amantadine (SYMMETREL) 100 MG capsule Take 100 mg by mouth daily.  Marland Kitchen aspirin 81 MG chewable tablet Chew 81 mg by mouth daily.  . ciprofloxacin (CIPRO) 500 MG tablet Take 1 tablet (500 mg total) by mouth 2 (two) times daily for 10 days. 10 days from 5/24  . colchicine 0.6 MG tablet Take 1 tablet (0.6 mg total) by mouth daily.  Marland Kitchen diltiazem (CARDIZEM CD) 120 MG 24 hr capsule Take 1 capsule (120 mg total) by mouth daily. For high blood pressure  . emtricitabine-rilpivir-tenofovir AF (ODEFSEY) 200-25-25 MG TABS tablet Take 1 tablet by mouth daily with breakfast. For HIV infection  . famotidine (PEPCID) 20 MG tablet Take 1 tablet (20 mg total) by mouth daily. For acid reflux  . gabapentin (NEURONTIN) 400 MG capsule Take 1 capsule (400 mg total) by mouth 2 (two) times daily. For agitation/neuropathic pain (Patient taking differently: Take 400 mg by mouth 2 (two) times daily as needed (For agitation/neuropathic pain). )  . hydrOXYzine (ATARAX/VISTARIL) 25 MG tablet Take 1 tablet (25 mg total) by mouth 3 (three) times daily as needed for anxiety.  . insulin glargine (LANTUS) 100 UNIT/ML injection Inject 0.3 mLs (30 Units total) into the skin at bedtime. For diabetes management  . insulin lispro (HUMALOG) 100 UNIT/ML injection Inject 0.05 mLs (5 Units total) into the skin 3 (three) times daily with meals. For  diabetes management (Patient taking differently: Inject 4 Units into the skin 3 (three) times daily with meals. For diabetes management)  . Insulin Syringe-Needle U-100 (B-D INS SYR MICROFINE 1CC/27G) 27G X 5/8" 1 ML MISC 1 strip by Does not apply route 3 (three) times daily as needed. For blood sugar checks  . isosorbide mononitrate (IMDUR) 30 MG 24 hr tablet Take 2 tablets (60 mg total) by mouth daily. For chest  . metFORMIN (GLUCOPHAGE) 1000 MG tablet Take 1 tablet (1,000 mg total) by mouth 2 (two) times daily with a meal. For diabetes management  . metroNIDAZOLE (FLAGYL) 500 MG tablet Take 1 tablet (500 mg total) by mouth 3 (three) times daily for 14 days. 10 days from 5/24  . oxyCODONE (OXY IR/ROXICODONE) 5 MG immediate release tablet Take 1 tablet (5 mg total) by mouth every 6 (six) hours as needed for moderate pain.  . polyethylene glycol (MIRALAX / GLYCOLAX) packet Take 17 g by mouth daily.  . pravastatin (PRAVACHOL) 40 MG tablet Take 1 tablet (40 mg total) by mouth at bedtime. For high cholesterol  . PROAIR HFA 108 (90 Base) MCG/ACT inhaler INHALE TWO PUFFS INTO THE LUNGS EVERY 6 (SIX) HOURS AS NEEDED FOR WHEEZING OR SHORTNESS OF BREATH.  . senna (SENOKOT) 8.6 MG TABS tablet Take 2 tablets (17.2 mg total) by mouth at bedtime as needed for mild constipation.  . sertraline (ZOLOFT) 50 MG tablet Take 1 tablet (50 mg total) by mouth daily. For depression  . traZODone (DESYREL)  50 MG tablet Take 1 tablet (50 mg total) by mouth at bedtime as needed for sleep.   No facility-administered encounter medications on file as of 03/04/2018.     Functional Status:  In your present state of health, do you have any difficulty performing the following activities: 02/20/2018 02/14/2018  Hearing? N N  Vision? N N  Comment - -  Difficulty concentrating or making decisions? N N  Walking or climbing stairs? Y N  Dressing or bathing? N N  Doing errands, shopping? N N  Comment - -  Some encounter information  is confidential and restricted. Go to Review Flowsheets activity to see all data.  Some recent data might be hidden    Fall/Depression Screening:  PHQ 2/9 Scores 10/22/2017 12/26/2016 04/17/2016 01/28/2016 01/25/2016 01/12/2016 01/08/2016  PHQ - 2 Score 0 '5 1 1 1 1 ' 0  PHQ- 9 Score - 19 - - - - -    Assessment:   CSW traveled to [place]  on 03/04/18 to visit client. CSW met with client on 03/04/18 at room of client at [facility] facility. CSW introduced self to client. CSW verified client identity. CSW received verbal permission from client on 03/04/18 for CSW to speak with client about client needs and status.  Client recently admitted to [facility] facility. He is receiving nursing care at facility. He is also receiving physical therapy sessions as scheduled at facility. Client did not mention any pain issues.  Client said he has reduced appetite and has talked with nurse at facility about his reduced appetite.  Client said he is receiving physical therapy sessions as scheduled at facility. He said he thought physical therapy sessions were helpful to client.  CSW spoke with client about client discharge plan.  Client said that he maoy stay at nurisng faciltiy to receive care for a few weeks. Client said that his brothers are supportive when client is at home.  Client said also that his sister is supportive. He said he thinks that he may go to live with his sister upn discharge from nursing center.  Sister of client resides in [place], [place].  Client said he has occasional memory issues. CSW encouraged Michael [name] to talk with facility social worker at client discharge plan.  CSW spoke with client about [facility] services in nursing, social work and pharmacy. CSW gave client [facility] CSW card and encouraged Michael [name] to call CSW as needed to discuss social work needs of client. Client said he is using rolling walker to do physical therapy walking as he is able.     Plan:  CSW to call  client in 3 weeks to assess client needs at that time.  Norva Riffle.Nora Rooke MSW, LCSW Licensed Clinical Social Worker [facility] Care Management [phone]

## 2018-03-06 DIAGNOSIS — I10 Essential (primary) hypertension: Secondary | ICD-10-CM | POA: Diagnosis not present

## 2018-03-06 DIAGNOSIS — E785 Hyperlipidemia, unspecified: Secondary | ICD-10-CM | POA: Diagnosis not present

## 2018-03-06 DIAGNOSIS — E139 Other specified diabetes mellitus without complications: Secondary | ICD-10-CM | POA: Diagnosis not present

## 2018-03-06 DIAGNOSIS — Z951 Presence of aortocoronary bypass graft: Secondary | ICD-10-CM | POA: Diagnosis not present

## 2018-03-10 ENCOUNTER — Ambulatory Visit: Payer: Self-pay | Admitting: Infectious Diseases

## 2018-03-11 ENCOUNTER — Ambulatory Visit: Payer: Self-pay | Admitting: Infectious Diseases

## 2018-03-17 DIAGNOSIS — I2510 Atherosclerotic heart disease of native coronary artery without angina pectoris: Secondary | ICD-10-CM | POA: Diagnosis not present

## 2018-03-23 DIAGNOSIS — I2510 Atherosclerotic heart disease of native coronary artery without angina pectoris: Secondary | ICD-10-CM | POA: Diagnosis not present

## 2018-03-25 DIAGNOSIS — Z125 Encounter for screening for malignant neoplasm of prostate: Secondary | ICD-10-CM | POA: Diagnosis not present

## 2018-03-25 DIAGNOSIS — Z8739 Personal history of other diseases of the musculoskeletal system and connective tissue: Secondary | ICD-10-CM | POA: Diagnosis not present

## 2018-03-25 DIAGNOSIS — E1140 Type 2 diabetes mellitus with diabetic neuropathy, unspecified: Secondary | ICD-10-CM | POA: Diagnosis not present

## 2018-03-25 DIAGNOSIS — J45909 Unspecified asthma, uncomplicated: Secondary | ICD-10-CM | POA: Diagnosis not present

## 2018-03-25 DIAGNOSIS — E1165 Type 2 diabetes mellitus with hyperglycemia: Secondary | ICD-10-CM | POA: Diagnosis not present

## 2018-03-25 DIAGNOSIS — I259 Chronic ischemic heart disease, unspecified: Secondary | ICD-10-CM | POA: Diagnosis not present

## 2018-03-25 DIAGNOSIS — N183 Chronic kidney disease, stage 3 (moderate): Secondary | ICD-10-CM | POA: Diagnosis not present

## 2018-03-25 DIAGNOSIS — Z9119 Patient's noncompliance with other medical treatment and regimen: Secondary | ICD-10-CM | POA: Diagnosis not present

## 2018-03-25 DIAGNOSIS — I2510 Atherosclerotic heart disease of native coronary artery without angina pectoris: Secondary | ICD-10-CM | POA: Diagnosis not present

## 2018-03-25 DIAGNOSIS — E1159 Type 2 diabetes mellitus with other circulatory complications: Secondary | ICD-10-CM | POA: Diagnosis not present

## 2018-03-25 DIAGNOSIS — E1169 Type 2 diabetes mellitus with other specified complication: Secondary | ICD-10-CM | POA: Diagnosis not present

## 2018-03-25 DIAGNOSIS — E785 Hyperlipidemia, unspecified: Secondary | ICD-10-CM | POA: Diagnosis not present

## 2018-03-25 DIAGNOSIS — I10 Essential (primary) hypertension: Secondary | ICD-10-CM | POA: Diagnosis not present

## 2018-03-26 ENCOUNTER — Other Ambulatory Visit: Payer: Self-pay | Admitting: Licensed Clinical Social Worker

## 2018-03-26 DIAGNOSIS — I2510 Atherosclerotic heart disease of native coronary artery without angina pectoris: Secondary | ICD-10-CM | POA: Diagnosis not present

## 2018-03-26 NOTE — Patient Outreach (Signed)
Assessment:  CSW spoke via phone with client on 03/26/18. CSW verified client identity. CSW received verbal permission from client for CSW to speak with client about client needs. Client reported that he recently discharged from [facility] facility. Client said that after discharge from [facility] facility, client went to reside at home of his sister in [place], [place]. Client said that he goes three times weekly to outpatient physical  Therapy sessions at [facility]. He said he is eating well, sleeping well, and has his prescribed medications. CSW talked with client about social work needs of client. Client informed CSW on 03/26/18 that client did not have any further clinical social work needs at present He said he was doing well and had no further social work needs at this time.  CSW congratulated client on doing well with his physical therapy sessions. He said he has strong family support from his sister and from his brothers. CSW informed client on 03/26/18 that [place] would discharge client on 03/26/18 from [place] services since client had no further social work needs. Client agreed to this plan. CSW thanked client for phone call with CSW on 03/26/18. Client was appreciative of call from  [place] on 03/26/18.  Plan:  CSW is discharging client on 03/26/18 from [place] services since client has no further social work needs at this time.  Norva Riffle.Raul Torrance MSW, LCSW Licensed Clinical Social Worker [facility] Care Management [phone]

## 2018-03-30 DIAGNOSIS — I2510 Atherosclerotic heart disease of native coronary artery without angina pectoris: Secondary | ICD-10-CM | POA: Diagnosis not present

## 2018-04-06 DIAGNOSIS — I2510 Atherosclerotic heart disease of native coronary artery without angina pectoris: Secondary | ICD-10-CM | POA: Diagnosis not present

## 2018-04-07 ENCOUNTER — Other Ambulatory Visit: Payer: Medicare Other

## 2018-04-11 DIAGNOSIS — I213 ST elevation (STEMI) myocardial infarction of unspecified site: Secondary | ICD-10-CM | POA: Diagnosis not present

## 2018-04-11 DIAGNOSIS — R0602 Shortness of breath: Secondary | ICD-10-CM | POA: Diagnosis not present

## 2018-04-11 DIAGNOSIS — J441 Chronic obstructive pulmonary disease with (acute) exacerbation: Secondary | ICD-10-CM | POA: Diagnosis not present

## 2018-04-11 DIAGNOSIS — R079 Chest pain, unspecified: Secondary | ICD-10-CM | POA: Diagnosis not present

## 2018-04-11 DIAGNOSIS — I4891 Unspecified atrial fibrillation: Secondary | ICD-10-CM | POA: Diagnosis not present

## 2018-04-11 DIAGNOSIS — E119 Type 2 diabetes mellitus without complications: Secondary | ICD-10-CM | POA: Diagnosis not present

## 2018-04-11 DIAGNOSIS — R0789 Other chest pain: Secondary | ICD-10-CM | POA: Diagnosis not present

## 2018-04-12 DIAGNOSIS — I491 Atrial premature depolarization: Secondary | ICD-10-CM | POA: Diagnosis not present

## 2018-04-12 DIAGNOSIS — E1165 Type 2 diabetes mellitus with hyperglycemia: Secondary | ICD-10-CM | POA: Diagnosis not present

## 2018-04-12 DIAGNOSIS — Z794 Long term (current) use of insulin: Secondary | ICD-10-CM | POA: Diagnosis not present

## 2018-04-12 DIAGNOSIS — I4581 Long QT syndrome: Secondary | ICD-10-CM | POA: Diagnosis not present

## 2018-04-12 DIAGNOSIS — I700 Atherosclerosis of aorta: Secondary | ICD-10-CM | POA: Diagnosis not present

## 2018-04-14 DIAGNOSIS — I4581 Long QT syndrome: Secondary | ICD-10-CM | POA: Diagnosis not present

## 2018-04-14 DIAGNOSIS — I491 Atrial premature depolarization: Secondary | ICD-10-CM | POA: Diagnosis not present

## 2018-04-15 IMAGING — DX DG CHEST 2V
2 series · 2 of 2 positions shown · non-contrast
Comparison: 12/19/2016

CLINICAL DATA: Shortness of breath with chest pain and cough for 3
days.

EXAM:
CHEST  2 VIEW

[chest pa]
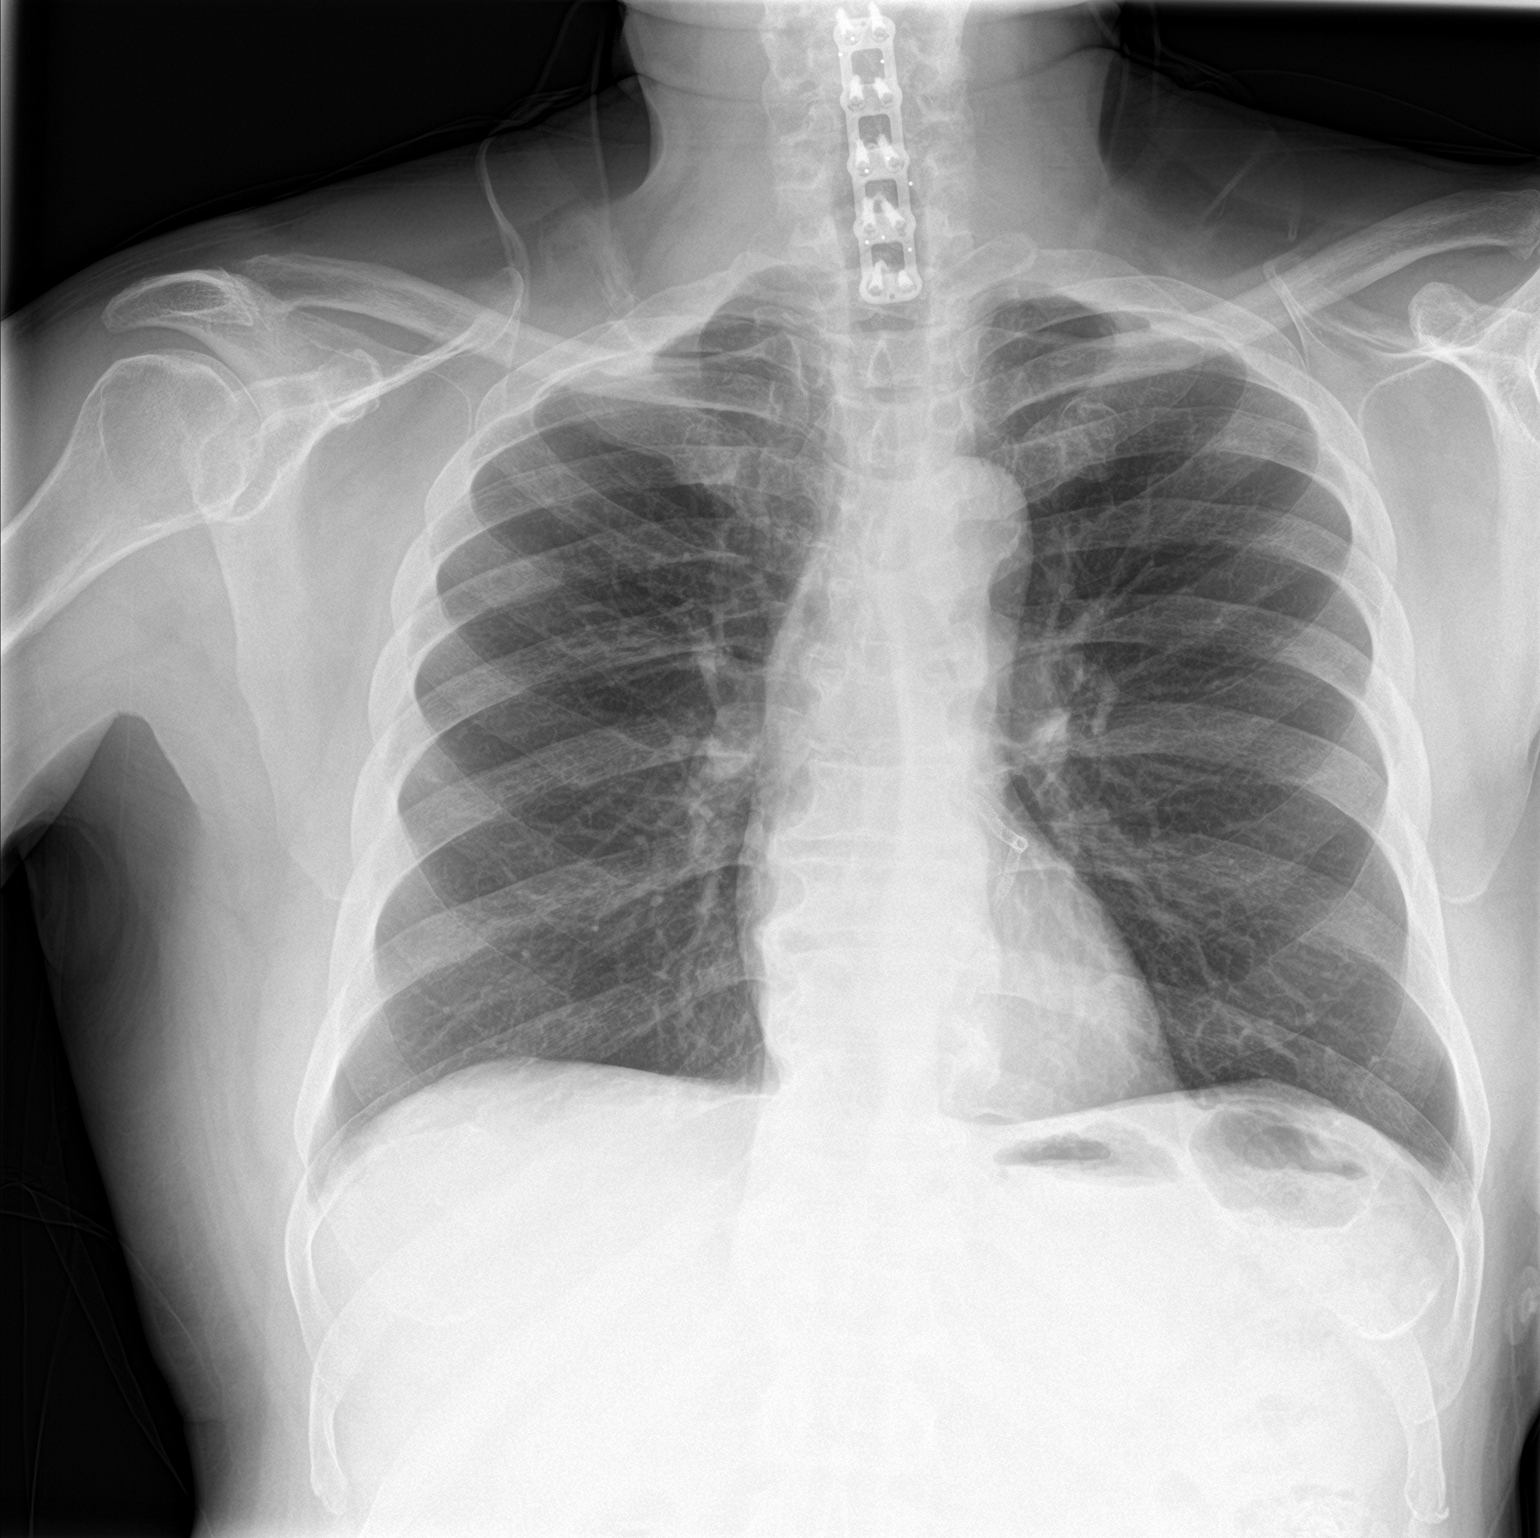

[chest lat]
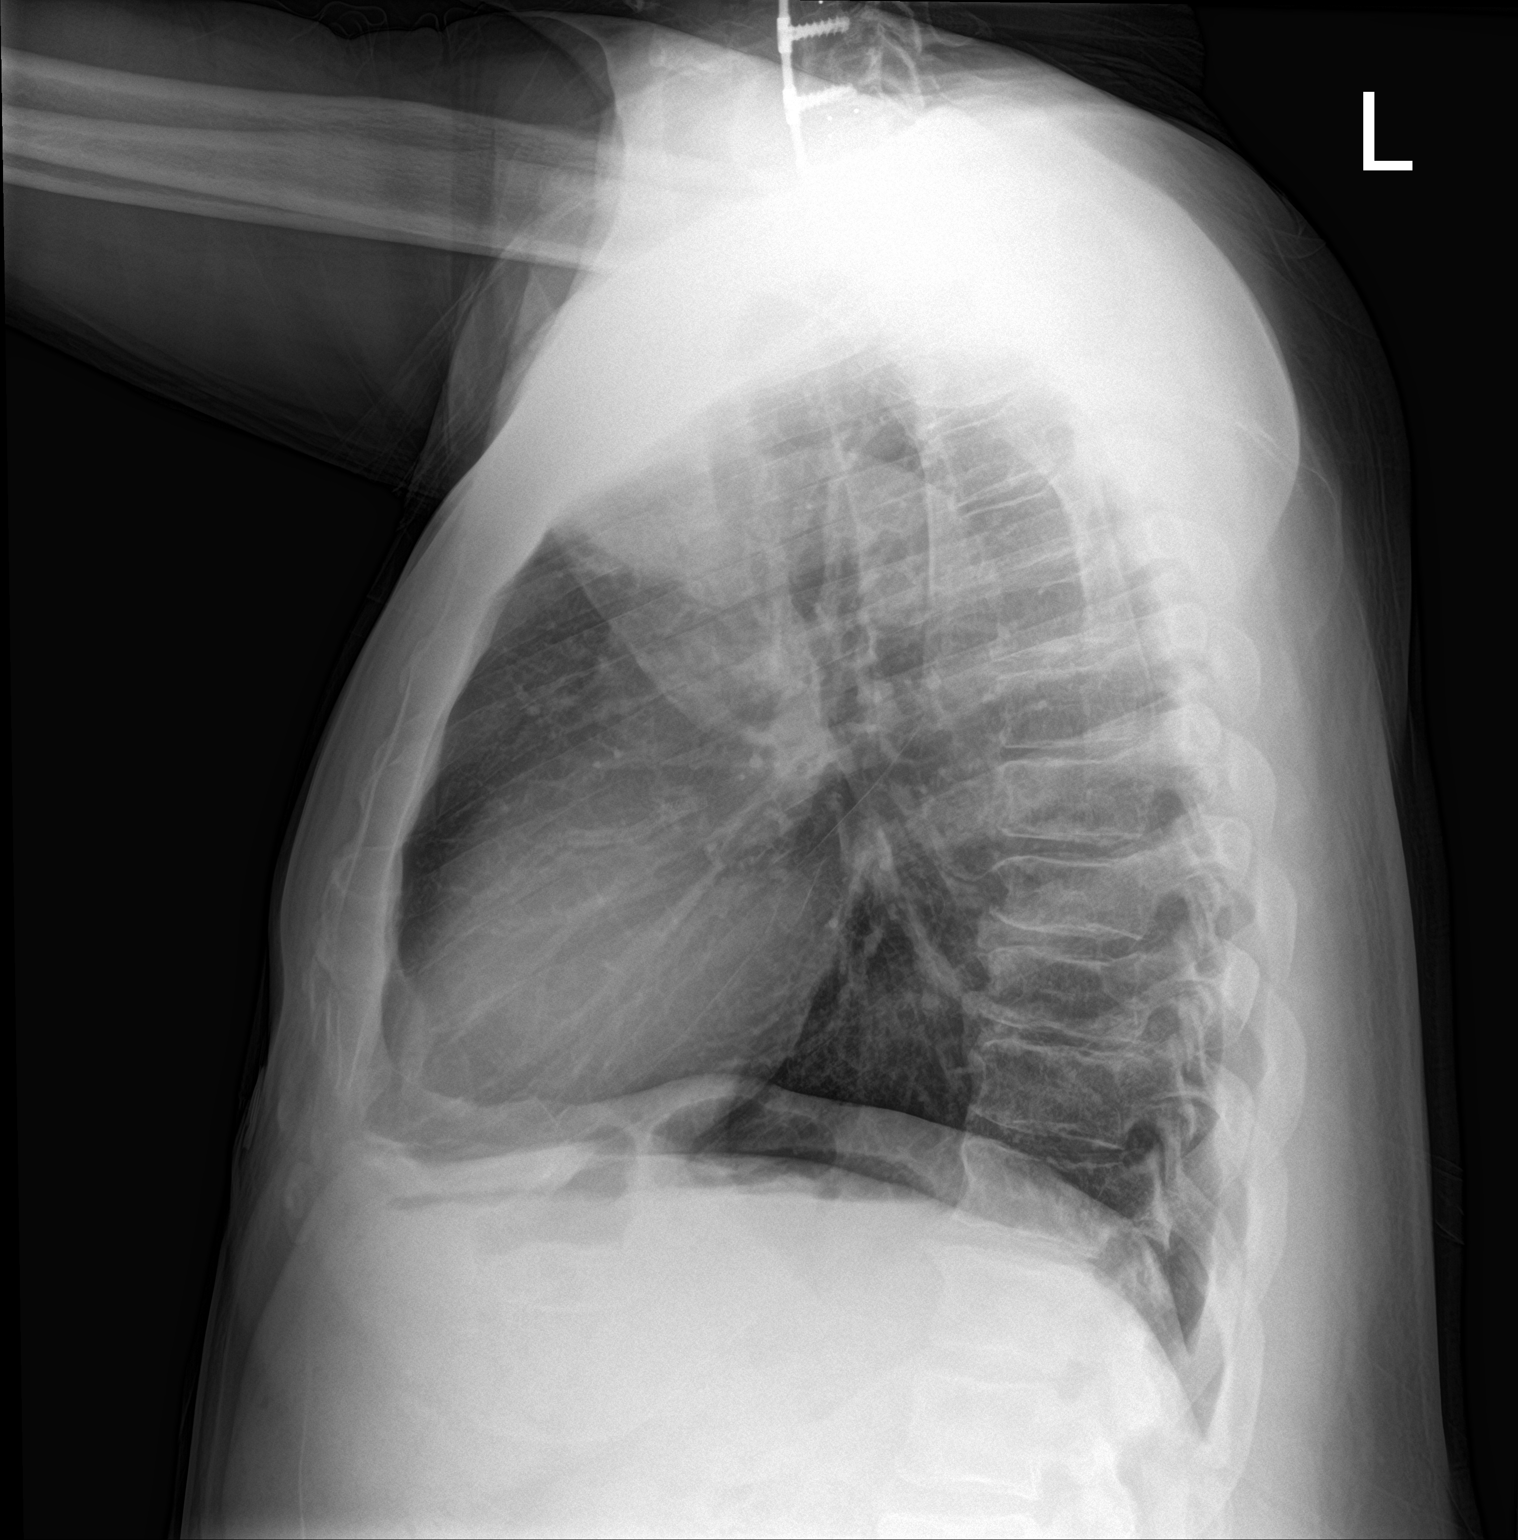

[2 of 2 positions shown; findings below may reference images not displayed]

FINDINGS: The lungs are clear wiithout focal pneumonia, edema, pneumothorax or
pleural effusion. The cardiopericardial silhouette is within normal
limits for size. Coronary stent device noted. Nodular
density/densities projecting over the lungs are compatible with pads
for telemetry leads. Extensive cervical fusion hardware evident. The
visualized bony structures of the thorax are intact.
IMPRESSION: Stable.  No acute cardiopulmonary findings.

## 2018-04-17 ENCOUNTER — Other Ambulatory Visit: Payer: Self-pay

## 2018-04-17 ENCOUNTER — Emergency Department (HOSPITAL_COMMUNITY)
Admission: EM | Admit: 2018-04-17 | Discharge: 2018-04-18 | Disposition: A | Discharge status: Home / Self Care | Payer: Medicare Other | Attending: Emergency Medicine | Admitting: Emergency Medicine

## 2018-04-17 ENCOUNTER — Emergency Department (HOSPITAL_COMMUNITY): Payer: Medicare Other

## 2018-04-17 ENCOUNTER — Encounter (HOSPITAL_COMMUNITY): Payer: Self-pay

## 2018-04-17 DIAGNOSIS — F1414 Cocaine abuse with cocaine-induced mood disorder: Secondary | ICD-10-CM | POA: Diagnosis present

## 2018-04-17 DIAGNOSIS — R45851 Suicidal ideations: Secondary | ICD-10-CM | POA: Diagnosis not present

## 2018-04-17 DIAGNOSIS — Z794 Long term (current) use of insulin: Secondary | ICD-10-CM | POA: Diagnosis not present

## 2018-04-17 DIAGNOSIS — R079 Chest pain, unspecified: Secondary | ICD-10-CM | POA: Diagnosis not present

## 2018-04-17 DIAGNOSIS — R05 Cough: Secondary | ICD-10-CM | POA: Diagnosis not present

## 2018-04-17 DIAGNOSIS — I2510 Atherosclerotic heart disease of native coronary artery without angina pectoris: Secondary | ICD-10-CM | POA: Insufficient documentation

## 2018-04-17 DIAGNOSIS — F331 Major depressive disorder, recurrent, moderate: Secondary | ICD-10-CM | POA: Insufficient documentation

## 2018-04-17 DIAGNOSIS — B20 Human immunodeficiency virus [HIV] disease: Secondary | ICD-10-CM | POA: Insufficient documentation

## 2018-04-17 DIAGNOSIS — J45909 Unspecified asthma, uncomplicated: Secondary | ICD-10-CM | POA: Diagnosis not present

## 2018-04-17 DIAGNOSIS — E1140 Type 2 diabetes mellitus with diabetic neuropathy, unspecified: Secondary | ICD-10-CM | POA: Insufficient documentation

## 2018-04-17 DIAGNOSIS — E1122 Type 2 diabetes mellitus with diabetic chronic kidney disease: Secondary | ICD-10-CM | POA: Insufficient documentation

## 2018-04-17 DIAGNOSIS — Z7982 Long term (current) use of aspirin: Secondary | ICD-10-CM | POA: Diagnosis not present

## 2018-04-17 DIAGNOSIS — R0789 Other chest pain: Secondary | ICD-10-CM | POA: Diagnosis not present

## 2018-04-17 DIAGNOSIS — N183 Chronic kidney disease, stage 3 (moderate): Secondary | ICD-10-CM | POA: Diagnosis not present

## 2018-04-17 DIAGNOSIS — R0602 Shortness of breath: Secondary | ICD-10-CM | POA: Diagnosis not present

## 2018-04-17 DIAGNOSIS — F1410 Cocaine abuse, uncomplicated: Secondary | ICD-10-CM | POA: Diagnosis not present

## 2018-04-17 DIAGNOSIS — I129 Hypertensive chronic kidney disease with stage 1 through stage 4 chronic kidney disease, or unspecified chronic kidney disease: Secondary | ICD-10-CM | POA: Insufficient documentation

## 2018-04-17 DIAGNOSIS — R000 Tachycardia, unspecified: Secondary | ICD-10-CM | POA: Diagnosis not present

## 2018-04-17 LAB — CBG MONITORING, ED
GLUCOSE-CAPILLARY: 147 mg/dL — AB (ref 70–99)
GLUCOSE-CAPILLARY: 252 mg/dL — AB (ref 70–99)
Glucose-Capillary: 288 mg/dL — ABNORMAL HIGH (ref 70–99)

## 2018-04-17 LAB — BASIC METABOLIC PANEL
Anion gap: 10 (ref 5–15)
BUN: 24 mg/dL — ABNORMAL HIGH (ref 8–23)
CHLORIDE: 103 mmol/L (ref 98–111)
CO2: 25 mmol/L (ref 22–32)
CREATININE: 1.66 mg/dL — AB (ref 0.61–1.24)
Calcium: 9.6 mg/dL (ref 8.9–10.3)
GFR calc non Af Amer: 41 mL/min — ABNORMAL LOW (ref >60)
GFR, EST AFRICAN AMERICAN: 48 mL/min — AB (ref >60)
Glucose, Bld: 338 mg/dL — ABNORMAL HIGH (ref 70–99)
POTASSIUM: 4.2 mmol/L (ref 3.5–5.1)
Sodium: 138 mmol/L (ref 135–145)

## 2018-04-17 LAB — CBC
HEMATOCRIT: 35.3 % — AB (ref 39.0–52.0)
HEMOGLOBIN: 11.4 g/dL — AB (ref 13.0–17.0)
MCH: 26.6 pg (ref 26.0–34.0)
MCHC: 32.3 g/dL (ref 30.0–36.0)
MCV: 82.5 fL (ref 78.0–100.0)
Platelets: 148 10*3/uL — ABNORMAL LOW (ref 150–400)
RBC: 4.28 MIL/uL (ref 4.22–5.81)
RDW: 15.2 % (ref 11.5–15.5)
WBC: 4.6 10*3/uL (ref 4.0–10.5)

## 2018-04-17 LAB — RAPID URINE DRUG SCREEN, HOSP PERFORMED
AMPHETAMINES: NOT DETECTED
BENZODIAZEPINES: NOT DETECTED
Cocaine: POSITIVE — AB
Opiates: NOT DETECTED
TETRAHYDROCANNABINOL: NOT DETECTED

## 2018-04-17 LAB — I-STAT TROPONIN, ED
TROPONIN I, POC: 0 ng/mL (ref 0.00–0.08)
Troponin i, poc: 0.01 ng/mL (ref 0.00–0.08)

## 2018-04-17 LAB — ETHANOL: Alcohol, Ethyl (B): <10 mg/dL (ref <10)

## 2018-04-17 MED ORDER — INSULIN ASPART 100 UNIT/ML ~~LOC~~ SOLN
8.0000 [IU] | Freq: Once | SUBCUTANEOUS | Status: AC
  Administered 2018-04-17: 8 [IU] via SUBCUTANEOUS
  Filled 2018-04-17: qty 1

## 2018-04-17 MED ORDER — ASPIRIN 81 MG PO CHEW
324.0000 mg | CHEWABLE_TABLET | Freq: Once | ORAL | Status: AC
  Administered 2018-04-17: 324 mg via ORAL
  Filled 2018-04-17: qty 4

## 2018-04-17 MED ORDER — INSULIN ASPART 100 UNIT/ML ~~LOC~~ SOLN
5.0000 [IU] | Freq: Three times a day (TID) | SUBCUTANEOUS | Status: DC
  Administered 2018-04-18: 5 [IU] via SUBCUTANEOUS
  Filled 2018-04-17: qty 1

## 2018-04-17 MED ORDER — PRAVASTATIN SODIUM 20 MG PO TABS
40.0000 mg | ORAL_TABLET | Freq: Every day | ORAL | Status: DC
  Administered 2018-04-17: 40 mg via ORAL
  Filled 2018-04-17: qty 2

## 2018-04-17 MED ORDER — HYDROXYZINE HCL 25 MG PO TABS: 25.0000 mg | ORAL_TABLET | Freq: Three times a day (TID) | ORAL | Status: DC | PRN

## 2018-04-17 MED ORDER — COLCHICINE 0.6 MG PO TABS
0.6000 mg | ORAL_TABLET | Freq: Every day | ORAL | Status: DC
  Administered 2018-04-17 – 2018-04-18 (×2): 0.6 mg via ORAL
  Filled 2018-04-17 (×2): qty 1

## 2018-04-17 MED ORDER — ISOSORBIDE MONONITRATE ER 60 MG PO TB24
60.0000 mg | ORAL_TABLET | Freq: Every day | ORAL | Status: DC
  Administered 2018-04-17 – 2018-04-18 (×2): 60 mg via ORAL
  Filled 2018-04-17 (×2): qty 1

## 2018-04-17 MED ORDER — INSULIN GLARGINE 100 UNIT/ML ~~LOC~~ SOLN
30.0000 [IU] | Freq: Every day | SUBCUTANEOUS | Status: DC
  Administered 2018-04-17: 30 [IU] via SUBCUTANEOUS
  Filled 2018-04-17 (×2): qty 0.3

## 2018-04-17 MED ORDER — TRAZODONE HCL 50 MG PO TABS
50.0000 mg | ORAL_TABLET | Freq: Every evening | ORAL | Status: DC | PRN
  Administered 2018-04-17: 50 mg via ORAL
  Filled 2018-04-17: qty 1

## 2018-04-17 MED ORDER — GABAPENTIN 400 MG PO CAPS
400.0000 mg | ORAL_CAPSULE | Freq: Two times a day (BID) | ORAL | Status: DC
  Administered 2018-04-17 – 2018-04-18 (×2): 400 mg via ORAL
  Filled 2018-04-17 (×3): qty 1

## 2018-04-17 MED ORDER — SERTRALINE HCL 50 MG PO TABS
50.0000 mg | ORAL_TABLET | Freq: Every day | ORAL | Status: DC
  Administered 2018-04-17 – 2018-04-18 (×2): 50 mg via ORAL
  Filled 2018-04-17 (×2): qty 1

## 2018-04-17 MED ORDER — ALBUTEROL SULFATE HFA 108 (90 BASE) MCG/ACT IN AERS: 1.0000 | INHALATION_SPRAY | Freq: Four times a day (QID) | RESPIRATORY_TRACT | Status: DC | PRN

## 2018-04-17 MED ORDER — OXYCODONE HCL 5 MG PO TABS: 5.0000 mg | ORAL_TABLET | Freq: Four times a day (QID) | ORAL | Status: DC | PRN

## 2018-04-17 MED ORDER — INSULIN LISPRO 100 UNIT/ML ~~LOC~~ SOLN: 5.0000 [IU] | Freq: Three times a day (TID) | SUBCUTANEOUS | Status: DC

## 2018-04-17 MED ORDER — FAMOTIDINE 20 MG PO TABS
20.0000 mg | ORAL_TABLET | Freq: Every day | ORAL | Status: DC
  Administered 2018-04-17 – 2018-04-18 (×2): 20 mg via ORAL
  Filled 2018-04-17 (×2): qty 1

## 2018-04-17 MED ORDER — ASPIRIN 81 MG PO CHEW
81.0000 mg | CHEWABLE_TABLET | Freq: Every day | ORAL | Status: DC
  Administered 2018-04-17 – 2018-04-18 (×2): 81 mg via ORAL
  Filled 2018-04-17 (×2): qty 1

## 2018-04-17 MED ORDER — AMANTADINE HCL 100 MG PO CAPS
100.0000 mg | ORAL_CAPSULE | Freq: Every day | ORAL | Status: DC
  Administered 2018-04-17 – 2018-04-18 (×2): 100 mg via ORAL
  Filled 2018-04-17 (×2): qty 1

## 2018-04-17 MED ORDER — EMTRICITAB-RILPIVIR-TENOFOV AF 200-25-25 MG PO TABS
1.0000 | ORAL_TABLET | Freq: Every day | ORAL | Status: DC
  Administered 2018-04-18: 1 via ORAL
  Filled 2018-04-17 (×2): qty 1

## 2018-04-17 MED ORDER — METFORMIN HCL 500 MG PO TABS
1000.0000 mg | ORAL_TABLET | Freq: Two times a day (BID) | ORAL | Status: DC
  Administered 2018-04-17 – 2018-04-18 (×2): 1000 mg via ORAL
  Filled 2018-04-17 (×2): qty 2

## 2018-04-17 MED ORDER — RISPERIDONE 0.5 MG PO TABS
0.5000 mg | ORAL_TABLET | Freq: Every day | ORAL | Status: DC
  Administered 2018-04-17 – 2018-04-18 (×2): 0.5 mg via ORAL
  Filled 2018-04-17 (×2): qty 1

## 2018-04-17 MED ORDER — DILTIAZEM HCL ER COATED BEADS 120 MG PO CP24
120.0000 mg | ORAL_CAPSULE | Freq: Every day | ORAL | Status: DC
  Administered 2018-04-17 – 2018-04-18 (×2): 120 mg via ORAL
  Filled 2018-04-17 (×2): qty 1

## 2018-04-17 MED ORDER — ALLOPURINOL 300 MG PO TABS
300.0000 mg | ORAL_TABLET | Freq: Every day | ORAL | Status: DC
  Administered 2018-04-17 – 2018-04-18 (×2): 300 mg via ORAL
  Filled 2018-04-17 (×2): qty 1

## 2018-04-17 NOTE — ED Triage Notes (Signed)
Pt bib EMS and presents with SOB that began today.  Pt reported to EMS that he did crack yesterday.  Pt reports having open heart surgery in May of 2019.  EMS reported clear lung sounds.  Pt reports some chest pain with deep inspiration.  EMS EKG was unremarkable.  Pt a/o x 4 and ambulatory. No signs of distress noted on arrival to ED.

## 2018-04-17 NOTE — ED Notes (Signed)
Pt calm and cooperative. Ate a sandwich. This Probation officer called Dr. Johnney Killian to address pt's insulin orders.

## 2018-04-17 NOTE — ED Triage Notes (Signed)
Pt endorses SI at this time with plan and reports previous Suicide attempt

## 2018-04-17 NOTE — BH Assessment (Signed)
[place] Assessment Progress Note Case was staffed with parks FNP who recommended patient be monitored and observed for safety. Patient will also be evaluated for possible medication interventions to assist with symptom management. Patient will be seen by psychiatry in the a.m.

## 2018-04-17 NOTE — ED Notes (Signed)
Bed: WA08 Expected date:  Expected time:  Means of arrival:  Comments: EMS-SOB 

## 2018-04-17 NOTE — ED Notes (Signed)
Called TCU they are getting another pt right now, asked for 15 min

## 2018-04-17 NOTE — BH Assessment (Addendum)
Assessment Note  Michael [name] is an 67 y.o. male that presents this date with S/I. Patient denies any H/I or AVH although voices thoughts of self harm with a plan to run into traffic. Patient has recently had open heart surgery two months ago and reports ongoing cocaine (crack) use stating he has been using up to 2 grams a day since he was released from his post operative care. Patient reports last use earlier this date when he stated he used a gram of cocaine. Patient denies any other SA history at this time. Patient reports ongoing anxiety and tightness in his chest he believes is associated with cocaine withdrawals and ongoing heart issues. Patient denies being on any current medication regimen to assist with symptoms of depression. Patient speaks in a low soft voice and makes limited eye contact with this Probation officer. Patient is oriented x 4 although renders limited treatment history. Patient states he was receiving services from [facility] over one year ago who assisted with medication management to assist with ongoing depression. Patient stated he cannot recall when he was initially diagnosed with depression but reports ongoing symptoms to include: guilt and feeling worthless. Per chart review patient has been seen multiple times within the last year by area providers with last assessment noted on 04/12/18 when patient presented with S/I and H/I. Patient states he has not followed up with aftercare due to multiple reasons to include: limited transportation, residing in a high risk environment where there is ongoing drug use and states "that kind of treatment does not work for me." Patient is requesting this date assistance with SA issues and be evaluated for medication to assist with depression. Per notes, "[facility] with complaints of suicidal ideations with no defined plan. Patient report he abuses crack cocaine and his suicidal ideations are related to his substance use. Patient report, "I  cannot stop smoking crack cocaine," Patient last used crack earlier this date. Patient report he uses crack due to his current housing situation with a roommate that also uses. Patient denies homicidal ideations, visual and auditory hallucinations". Case was staffed with parks FNP who recommended patient be monitored and observed for safety. Patient will also be evaluated for possible medication interventions to assist with symptom management. Patient will be seen by psychiatry in the a.m.   Diagnosis: F33.1 MDD severe recurrent without psychotic features, Cocaine abuse  Past Medical History:  Past Medical History:  Diagnosis Date  . A-fib ([place])   . Asthma    No PFTs, history of childhood asthma  . CAD (coronary artery disease)    a. 06/2013 STEMI/PCI ([facility]): LAD w/ thrombus (treated with BMS), mid 75%, D2 75%; LCX OM2 75%; RCA small, PDA 95%, PLV 95%;  b. 10/2013 Cath/PCI: ISR w/in LAD (Promus DES x 2), borderline OM2 lesion;  c. 01/2014 MV: Intermediate risk, medium-sized distal ant wall infarct w/ very small amt of peri-infarct ischemia. EF 60%.  . Cellulitis 04/2014   left facial  . Chondromalacia of medial femoral condyle    Left knee MRI 04/28/12: Chondromalacia of the medial femoral condyle with slight peripheral degeneration of the meniscocapsular junction of the medial meniscus; followed by sports medicine  . Collagen vascular disease ([place])   . Crack cocaine use    for 20+ years, has been enrolled in detox programs in the past  . Depression    with history of hospitalization for suicidal ideation  . Diabetes mellitus 2002   Diagnosed in 2002, started insulin in  2012  . Gout   . Gout 04/28/2012  . Headache(784.0)    CT head 08/2011: Periventricular and subcortical white matter hypodensities are most in keeping with chronic microangiopathic change  . HIV infection [facility]) Nov 2012   Followed by [name]  . Hyperlipidemia   . Hypertension   . Pulmonary embolism [facility])     Past  Surgical History:  Procedure Laterality Date  . BACK SURGERY     1988  . BOWEL RESECTION    . CARDIAC SURGERY    . CERVICAL SPINE SURGERY     " rods in my neck "  . CORONARY STENT PLACEMENT    . NM MYOCAR PERF WALL MOTION  12/27/2011   normal  . SPINE SURGERY      Family History:  Family History  Problem Relation Age of Onset  . Diabetes Mother   . Hypertension Mother   . Hyperlipidemia Mother   . Diabetes Father   . Cancer Father   . Hypertension Father   . Diabetes Brother   . Heart disease Brother   . Diabetes Sister   . Colon cancer Neg Hx     Social History:  reports that he has never smoked. He has never used smokeless tobacco. He reports that he has current or past drug history. Drugs: "Crack" cocaine and Cocaine. He reports that he does not drink alcohol.  Additional Social History:  Alcohol / Drug Use Pain Medications: see MAR Prescriptions: see  MAR Over the Counter: see MAR History of alcohol / drug use?: Yes Longest period of sobriety (when/how long): 3 yrs Negative Consequences of Use: Personal relationships, Financial Withdrawal Symptoms: (Denies) Substance #1 Name of Substance 1: Cocaine (crack) 1 - Age of First Use: 20's  1 - Amount (size/oz): 2 to 3 grams 1 - Frequency: Daily 1 - Duration: Last 3 three years  1 - Last Use / Amount: 04/16/18 2grams  CIWA: CIWA-Ar BP: (!) 154/98 Pulse Rate: 89 COWS:    Allergies: No Known Allergies  Home Medications:  (Not in a hospital admission)  OB/GYN Status:  No LMP for male patient.  General Assessment Data [facility] of [facility]: [facility] TTS Assessment: In system Is this a Tele or Face-to-Face Assessment?: Face-to-Face Is this an Initial Assessment or a Re-assessment for this encounter?: Initial Assessment Marital status: Single Maiden name: NA Is patient pregnant?: No Pregnancy Status: No Living Arrangements: Alone Can pt return to current living arrangement?: Yes Admission Status: Voluntary Is  patient capable of signing voluntary admission?: Yes Referral Source: Self/Family/Friend Insurance type: Medicare  Medical Screening Exam ([place]) Medical Exam completed: Yes  Crisis Care Plan Living Arrangements: Alone Legal Guardian: (NA) Name of Psychiatrist: None Name of Therapist: None  Education Status Is patient currently in school?: No Is the patient employed, unemployed or receiving disability?: Unemployed  Risk to self with the past 6 months Suicidal Ideation: Yes-Currently Present Has patient been a risk to self within the past 6 months prior to admission? : No Suicidal Intent: Yes-Currently Present Has patient had any suicidal intent within the past 6 months prior to admission? : No Is patient at risk for suicide?: Yes Suicidal Plan?: Yes-Currently Present Has patient had any suicidal plan within the past 6 months prior to admission? : No Specify Current Suicidal Plan: Run into traffic Access to Means: Yes Specify Access to Suicidal Means: Run into traffic What has been your use of drugs/alcohol within the last 12 months?: Current use Previous Attempts/Gestures:  Yes How many times?: 1(Per notes) Other Self Harm Risks: (Excessive SA use) Triggers for Past Attempts: Unknown Intentional Self Injurious Behavior: None Family Suicide History: No Recent stressful life event(s): Other (Comment)(Excessive SA use) Persecutory voices/beliefs?: No Depression: Yes Depression Symptoms: Guilt Substance abuse history and/or treatment for substance abuse?: Yes Suicide prevention information given to non-admitted patients: Not applicable  Risk to Others within the past 6 months Homicidal Ideation: No Does patient have any lifetime risk of violence toward others beyond the six months prior to admission? : No Thoughts of Harm to Others: No Current Homicidal Intent: No Current Homicidal Plan: No Access to Homicidal Means: No Identified Victim: NA History of harm to  others?: No Assessment of Violence: None Noted Violent Behavior Description: NA Does patient have access to weapons?: No Criminal Charges Pending?: No Does patient have a court date: No Is patient on probation?: No  Psychosis Hallucinations: None noted Delusions: None noted  Mental Status Report Appearance/Hygiene: In scrubs Eye Contact: Fair Motor Activity: Freedom of movement Speech: Soft, Slow Level of Consciousness: Drowsy Mood: Depressed Affect: Appropriate to circumstance Anxiety Level: Minimal Thought Processes: Coherent, Relevant Judgement: Partial Orientation: Person, Place, Time Obsessive Compulsive Thoughts/Behaviors: None  Cognitive Functioning Concentration: Decreased Memory: Recent Intact, Remote Intact Is patient IDD: No Is patient DD?: No Insight: Poor Impulse Control: Poor Appetite: Fair Have you had any weight changes? : No Change Sleep: Decreased Total Hours of Sleep: 4 Vegetative Symptoms: None  ADLScreening [facility] Assessment Services) Patient's cognitive ability adequate to safely complete daily activities?: Yes Patient able to express need for assistance with ADLs?: Yes Independently performs ADLs?: Yes (appropriate for developmental age)  Prior Inpatient Therapy Prior Inpatient Therapy: Yes Prior Therapy Dates: 2019,2018 Prior Therapy Facilty/Provider(s): [facility], [facility], [facility] Reason for Treatment: MH issues  Prior Outpatient Therapy Prior Outpatient Therapy: Yes Prior Therapy Dates: 2018 Prior Therapy Facilty/Provider(s): [facility] Reason for Treatment: MH issues Does patient have an ACCT team?: No Does patient have Intensive In-House Services?  : No Does patient have [facility] services? : Yes(Per history in 2018) Does patient have P4[facility] services?: No  ADL Screening (condition at time of admission) Patient's cognitive ability adequate to safely complete daily activities?: Yes Is the patient deaf or have difficulty hearing?: No Does the  patient have difficulty seeing, even when wearing glasses/contacts?: No Does the patient have difficulty concentrating, remembering, or making decisions?: No Patient able to express need for assistance with ADLs?: Yes Does the patient have difficulty dressing or bathing?: No Independently performs ADLs?: Yes (appropriate for developmental age) Does the patient have difficulty walking or climbing stairs?: No Weakness of Legs: None Weakness of Arms/Hands: None  Home Assistive Devices/Equipment Home Assistive Devices/Equipment: None  Therapy Consults (therapy consults require a physician order) PT Evaluation Needed: No OT Evalulation Needed: No SLP Evaluation Needed: No Abuse/Neglect Assessment (Assessment to be complete while patient is alone) Physical Abuse: Denies Verbal Abuse: Denies Sexual Abuse: Denies Exploitation of patient/patient's resources: Denies Self-Neglect: Denies Values / Beliefs Cultural Requests During Hospitalization: None Spiritual Requests During Hospitalization: None Consults Spiritual Care Consult Needed: No Social Work Consult Needed: No Regulatory affairs officer (For Healthcare) Does Patient Have a Medical Advance Directive?: No Would patient like information on creating a medical advance directive?: No - Patient declined    Additional Information 1:1 In Past 12 Months?: No CIRT Risk: No Elopement Risk: No Does patient have medical clearance?: Yes     Disposition: Case was staffed with parks FNP who recommended patient be monitored and observed  for safety. Patient will also be evaluated for possible medication interventions to assist with symptom management. Patient will be seen by psychiatry in the a.m. Disposition Initial Assessment Completed for this Encounter: Yes Disposition of Patient: (Observe and monitor for safety) Patient refused recommended treatment: No Mode of transportation if patient is discharged?: [facility])  On Site Evaluation by:    Reviewed with Physician:    Mamie Nick 04/17/2018 6:05 PM

## 2018-04-17 NOTE — ED Notes (Signed)
SBAR Report received from previous nurse. Pt received calm and visible on unit. Pt denies current SI/ HI, A/V H, depression, anxiety, or pain at this time, and appears otherwise stable and free of distress. Pt reminded of camera surveillance, q 15 min rounds, and rules of the milieu. Will continue to assess. 

## 2018-04-17 NOTE — ED Notes (Signed)
Pt states that he lives with his sister and he is compliant taking his medications.

## 2018-04-17 NOTE — ED Notes (Signed)
Report given to [facility].

## 2018-04-17 NOTE — ED Notes (Signed)
Bed: [facility] Expected date:  Expected time:  Means of arrival:  Comments: [place] [facility]

## 2018-04-17 NOTE — ED Provider Notes (Signed)
[facility] DEPT Provider Note   CSN: [id number] Arrival date & time: 04/17/18  1252     History   Chief Complaint Chief Complaint  Patient presents with  . Shortness of Breath  . Suicidal    HPI Michael [name] is a 67 y.o. male.  HPI Patient presents to the emergency room for evaluation of chest pain.  Patient admits to crack use yesterday.  Following that he began having pain in his chest.  Patient states that hurts to take a deep breath and also hurts when he pushes on his chest.  He has an occasional cough but no fevers or shortness of breath.  Patient does have a known history of coronary artery disease with cardiac stents.  Patient also admits to feeling depressed.  He says he has had suicidal ideation.  He thinks about jumping in front of a vehicle or cutting his wrists. Past Medical History:  Diagnosis Date  . A-fib ([place])   . Asthma    No PFTs, history of childhood asthma  . CAD (coronary artery disease)    a. 06/2013 STEMI/PCI ([facility]): LAD w/ thrombus (treated with BMS), mid 75%, D2 75%; LCX OM2 75%; RCA small, PDA 95%, PLV 95%;  b. 10/2013 Cath/PCI: ISR w/in LAD (Promus DES x 2), borderline OM2 lesion;  c. 01/2014 MV: Intermediate risk, medium-sized distal ant wall infarct w/ very small amt of peri-infarct ischemia. EF 60%.  . Cellulitis 04/2014   left facial  . Chondromalacia of medial femoral condyle    Left knee MRI 04/28/12: Chondromalacia of the medial femoral condyle with slight peripheral degeneration of the meniscocapsular junction of the medial meniscus; followed by sports medicine  . Collagen vascular disease ([place])   . Crack cocaine use    for 20+ years, has been enrolled in detox programs in the past  . Depression    with history of hospitalization for suicidal ideation  . Diabetes mellitus 2002   Diagnosed in 2002, started insulin in 2012  . Gout   . Gout 04/28/2012  . Headache(784.0)    CT head 08/2011: Periventricular and  subcortical white matter hypodensities are most in keeping with chronic microangiopathic change  . HIV infection [facility]) Nov 2012   Followed by [name]  . Hyperlipidemia   . Hypertension   . Pulmonary embolism [facility])     Patient Active Problem List   Diagnosis Date Noted  . Left testicular pain   . Depression 02/20/2018  . Epididymo-orchitis, acute 02/20/2018  . Essential hypertension 02/20/2018  . Chest pain 02/20/2018  . AKI (acute kidney injury) ([place]) 02/14/2018  . HCAP (healthcare-associated pneumonia) 02/14/2018  . Hepatitis C 12/30/2016  . Back pain 04/18/2016  . S/P carotid endarterectomy 11/15/2015  . MDD (major depressive disorder), recurrent severe, without psychosis ([place]) 09/09/2015  . Cocaine-induced mood disorder ([place]) 08/14/2015  . Cocaine abuse with cocaine-induced mood disorder ([place]) 08/14/2015  . Gout 07/10/2015  . CKD (chronic kidney disease) stage 3, GFR 30-59 ml/min ([name]) 03/06/2015  . Normocytic anemia 03/06/2015  . Hypoglycemia   . Encounter for general adult medical examination with abnormal findings 02/09/2015  . Cocaine use disorder, severe, dependence ([place]) 12/13/2014  . Substance or medication-induced depressive disorder with onset during withdrawal ([place]) 12/13/2014  . Severe recurrent major depressive disorder with psychotic features ([place]) 12/12/2014  . Cervicalgia 06/28/2014  . Lumbar radiculopathy, chronic 06/28/2014  . Asthma, chronic 02/03/2014  . 3-vessel CAD 06/24/2013  . ED (erectile dysfunction) of organic origin  07/07/2012  . Hypertension goal BP (blood pressure) < 140/80 04/29/2012  . Chondromalacia of left knee 03/19/2012  . Hyperlipidemia with target LDL less than 100 02/12/2012  . Fibromyalgia 02/12/2012  . HIV (human immunodeficiency virus infection) ([place]) 08/27/2011  . Uncontrolled type 2 diabetes with neuropathy ([place]) 10/17/2000    Past Surgical History:  Procedure Laterality Date  . BACK SURGERY     1988  . BOWEL RESECTION     . CARDIAC SURGERY    . CERVICAL SPINE SURGERY     " rods in my neck "  . CORONARY STENT PLACEMENT    . NM MYOCAR PERF WALL MOTION  12/27/2011   normal  . SPINE SURGERY          Home Medications    Prior to Admission medications   Medication Sig Start Date End Date Taking? Authorizing Provider  albuterol (PROAIR HFA) 108 (90 Base) MCG/ACT inhaler INHALE TWO PUFFS INTO THE LUNGS EVERY 6 (SIX) HOURS AS NEEDED FOR WHEEZING OR SHORTNESS OF BREATH. 11/24/17  Yes Lindell Spar I, NP  allopurinol (ZYLOPRIM) 300 MG tablet Take 1 tablet (300 mg total) by mouth daily. For gout. 11/25/17  Yes Lindell Spar I, NP  amantadine (SYMMETREL) 100 MG capsule Take 100 mg by mouth daily. 02/14/18  Yes [provider]  aspirin 81 MG chewable tablet Chew 81 mg by mouth daily.   Yes [provider]  colchicine 0.6 MG tablet Take 1 tablet (0.6 mg total) by mouth daily. 02/27/18  Yes Merlene Laughter F, NP  diltiazem (CARDIZEM CD) 120 MG 24 hr capsule Take 1 capsule (120 mg total) by mouth daily. For high blood pressure 11/25/17  Yes Lindell Spar I, NP  emtricitabine-rilpivir-tenofovir AF (ODEFSEY) 200-25-25 MG TABS tablet Take 1 tablet by mouth daily with breakfast. For HIV infection 11/24/17  Yes Nwoko, Herbert Pun I, NP  famotidine (PEPCID) 20 MG tablet Take 1 tablet (20 mg total) by mouth daily. For acid reflux 11/24/17  Yes Lindell Spar I, NP  gabapentin (NEURONTIN) 400 MG capsule Take 1 capsule (400 mg total) by mouth 2 (two) times daily. For agitation/neuropathic pain Patient taking differently: Take 400 mg by mouth 2 (two) times daily as needed (For agitation/neuropathic pain).  11/24/17  Yes Lindell Spar I, NP  hydrOXYzine (ATARAX/VISTARIL) 25 MG tablet Take 1 tablet (25 mg total) by mouth 3 (three) times daily as needed for anxiety. 11/24/17  Yes Lindell Spar I, NP  insulin glargine (LANTUS) 100 UNIT/ML injection Inject 0.3 mLs (30 Units total) into the skin at bedtime. For diabetes management 11/24/17   Yes Lindell Spar I, NP  insulin lispro (HUMALOG) 100 UNIT/ML injection Inject 0.05 mLs (5 Units total) into the skin 3 (three) times daily with meals. For diabetes management Patient taking differently: Inject 4 Units into the skin 3 (three) times daily with meals. For diabetes management 11/24/17  Yes Lindell Spar I, NP  isosorbide mononitrate (IMDUR) 30 MG 24 hr tablet Take 2 tablets (60 mg total) by mouth daily. For chest 02/26/18  Yes Merlene Laughter F, NP  metFORMIN (GLUCOPHAGE) 1000 MG tablet Take 1 tablet (1,000 mg total) by mouth 2 (two) times daily with a meal. For diabetes management 11/24/17  Yes Lindell Spar I, NP  oxyCODONE (OXY IR/ROXICODONE) 5 MG immediate release tablet Take 1 tablet (5 mg total) by mouth every 6 (six) hours as needed for moderate pain. 02/26/18  Yes Merlene Laughter F, NP  pravastatin (PRAVACHOL) 40 MG tablet Take 1 tablet (40  mg total) by mouth at bedtime. For high cholesterol 11/24/17  Yes Nwoko, Herbert Pun I, NP  PROAIR HFA 108 (90 Base) MCG/ACT inhaler INHALE TWO PUFFS INTO THE LUNGS EVERY 6 (SIX) HOURS AS NEEDED FOR WHEEZING OR SHORTNESS OF BREATH. 02/24/18  Yes [name], MD  risperiDONE (RISPERDAL) 0.5 MG tablet Take 0.5 mg by mouth daily. 06/16/17  Yes [provider]  sertraline (ZOLOFT) 50 MG tablet Take 1 tablet (50 mg total) by mouth daily. For depression 11/25/17  Yes Lindell Spar I, NP  traZODone (DESYREL) 50 MG tablet Take 1 tablet (50 mg total) by mouth at bedtime as needed for sleep. 11/24/17  Yes Nwoko, Herbert Pun I, NP  ACCU-CHEK SOFTCLIX LANCETS lancets USE TO CHECK BLOOD GLUCOSE LEVELS UP TO 4 TIMES DAILY AS DIRECTED 11/24/17   Lindell Spar I, NP  Insulin Syringe-Needle U-100 (B-D INS SYR MICROFINE 1CC/27G) 27G X 5/8" 1 ML MISC 1 strip by Does not apply route 3 (three) times daily as needed. For blood sugar checks 11/24/17   Lindell Spar I, NP  polyethylene glycol (MIRALAX / GLYCOLAX) packet Take 17 g by mouth daily. Patient not taking: Reported on  04/17/2018 02/27/18   Johnsie Cancel, NP  senna (SENOKOT) 8.6 MG TABS tablet Take 2 tablets (17.2 mg total) by mouth at bedtime as needed for mild constipation. Patient not taking: Reported on 04/17/2018 02/26/18   Jonetta Osgood, MD    Family History Family History  Problem Relation Age of Onset  . Diabetes Mother   . Hypertension Mother   . Hyperlipidemia Mother   . Diabetes Father   . Cancer Father   . Hypertension Father   . Diabetes Brother   . Heart disease Brother   . Diabetes Sister   . Colon cancer Neg Hx     Social History Social History   Tobacco Use  . Smoking status: Never Smoker  . Smokeless tobacco: Never Used  Substance Use Topics  . Alcohol use: No    Alcohol/week: 2.4 oz    Types: 2 Cans of beer, 2 Shots of liquor per week  . Drug use: Yes    Types: "Crack" cocaine, Cocaine    Comment: last use January 2018     Allergies   Patient has no known allergies.   Review of Systems Review of Systems  All other systems reviewed and are negative.    Physical Exam Updated Vital Signs BP (!) 154/98 (BP Location: Left Arm)   Pulse 89   Temp 97.8 F (36.6 C) (Oral)   Resp 13   Wt 74.4 kg (164 lb)   SpO2 100%   BMI 25.69 kg/m   Physical Exam  Constitutional: He appears well-developed and well-nourished. No distress.  HENT:  Head: Normocephalic and atraumatic.  Right Ear: External ear normal.  Left Ear: External ear normal.  Eyes: Conjunctivae are normal. Right eye exhibits no discharge. Left eye exhibits no discharge. No scleral icterus.  Neck: Neck supple. No tracheal deviation present.  Cardiovascular: Normal rate, regular rhythm and intact distal pulses.  Pulmonary/Chest: Effort normal and breath sounds normal. No stridor. No respiratory distress. He has no wheezes. He has no rales.  Chest wall ttp  Abdominal: Soft. Bowel sounds are normal. He exhibits no distension. There is no tenderness. There is no rebound and no guarding.    Musculoskeletal: He exhibits no edema or tenderness.  Neurological: He is alert. He has normal strength. No cranial nerve deficit (no facial droop, extraocular movements  intact, no slurred speech) or sensory deficit. He exhibits normal muscle tone. He displays no seizure activity. Coordination normal.  Skin: Skin is warm and dry. No rash noted.  Psychiatric: He exhibits a depressed mood. He expresses suicidal ideation. He expresses suicidal plans.  Nursing note and vitals reviewed.    ED Treatments / Results  Labs (all labs ordered are listed, but only abnormal results are displayed) Labs Reviewed  BASIC METABOLIC PANEL - Abnormal; Notable for the following components:      Result Value   Glucose, Bld 338 (*)    BUN 24 (*)    Creatinine, Ser 1.66 (*)    GFR calc non Af Amer 41 (*)    GFR calc Af Amer 48 (*)    All other components within normal limits  CBC - Abnormal; Notable for the following components:   Hemoglobin 11.4 (*)    HCT 35.3 (*)    Platelets 148 (*)    All other components within normal limits  ETHANOL  RAPID URINE DRUG SCREEN, HOSP PERFORMED  I-STAT TROPONIN, ED    EKG None  Radiology Dg Chest Portable 1 View  Result Date: 04/17/2018 CLINICAL DATA:  Shortness of breath EXAM: PORTABLE CHEST 1 VIEW COMPARISON:  Chest radiograph and chest CT April 11, 2018 FINDINGS: There is persistent consolidation in the right base region. There is persistent elevation of the right hemidiaphragm. Lungs elsewhere clear. Heart size and pulmonary vascularity are normal. No adenopathy. Patient is status post median sternotomy. There is a left atrial appendage clamp. No adenopathy. There is postoperative change in the lower cervical region. IMPRESSION: Persistent consolidation right base. Persistent elevation right hemidiaphragm. No new opacity. Stable cardiac silhouette. Electronically Signed   By: Lowella Grip III M.D.   On: 04/17/2018 14:43    Procedures Procedures (including  critical care time)  Medications Ordered in ED Medications  aspirin chewable tablet 324 mg (324 mg Oral Given 04/17/18 1441)     Initial Impression / Assessment and Plan / ED Course  I have reviewed the triage vital signs and the nursing notes.  Pertinent labs & imaging results that were available during my care of the patient were reviewed by me and considered in my medical decision making (see chart for details).   To the emergency room for evaluation of chest pain in the setting of recurrent cocaine abuse.  Patient initial cardiac markers and EKG are reassuring.  His symptoms are atypical and that it is sharp pain and worse with inspiration.  Plan on checking a second cardiac enzyme.  If that is normal patient will be likely cleared for psychiatric evaluation.  Final Clinical Impressions(s) / ED Diagnoses   Final diagnoses:  Suicidal ideations  Cocaine abuse ([place])  Chest pain, unspecified type      [name], MD 04/17/18 1622

## 2018-04-18 DIAGNOSIS — R45851 Suicidal ideations: Secondary | ICD-10-CM | POA: Diagnosis not present

## 2018-04-18 DIAGNOSIS — R079 Chest pain, unspecified: Secondary | ICD-10-CM

## 2018-04-18 DIAGNOSIS — F1410 Cocaine abuse, uncomplicated: Secondary | ICD-10-CM

## 2018-04-18 DIAGNOSIS — F1414 Cocaine abuse with cocaine-induced mood disorder: Secondary | ICD-10-CM | POA: Diagnosis not present

## 2018-04-18 LAB — CBG MONITORING, ED
Glucose-Capillary: 182 mg/dL — ABNORMAL HIGH (ref 70–99)
Glucose-Capillary: 222 mg/dL — ABNORMAL HIGH (ref 70–99)

## 2018-04-18 NOTE — [facility] Suicide Risk Assessment (Signed)
Suicide Risk Assessment  Discharge Assessment   [facility] Discharge Suicide Risk Assessment   Principal Problem: Cocaine abuse with cocaine-induced mood disorder [facility]) Discharge Diagnoses:  Patient Active Problem List   Diagnosis Date Noted  . Cocaine abuse with cocaine-induced mood disorder [facility]) [F14.14] 08/14/2015    Priority: High  . Cocaine use disorder, severe, dependence ([place]) [F14.20] 12/13/2014    Priority: High  . Left testicular pain [N50.812]   . Depression [F32.9] 02/20/2018  . Epididymo-orchitis, acute [N45.3] 02/20/2018  . Essential hypertension [I10] 02/20/2018  . Chest pain [R07.9] 02/20/2018  . AKI (acute kidney injury) ([place]) [N17.9] 02/14/2018  . HCAP (healthcare-associated pneumonia) [J18.9] 02/14/2018  . Hepatitis C [B19.20] 12/30/2016  . Back pain [M54.9] 04/18/2016  . S/P carotid endarterectomy [Z98.890] 11/15/2015  . MDD (major depressive disorder), recurrent severe, without psychosis ([place]) [F33.2] 09/09/2015  . Cocaine-induced mood disorder ([place]) [F14.94] 08/14/2015  . Gout [M10.9] 07/10/2015  . CKD (chronic kidney disease) stage 3, GFR 30-59 ml/min ([name]) [N18.3] 03/06/2015  . Normocytic anemia [D64.9] 03/06/2015  . Hypoglycemia [E16.2]   . Encounter for general adult medical examination with abnormal findings [Z00.01] 02/09/2015  . Substance or medication-induced depressive disorder with onset during withdrawal [facility]) [D22.025, F19.24, F32.89] 12/13/2014  . Severe recurrent major depressive disorder with psychotic features ([place]) [F33.3] 12/12/2014  . Cervicalgia [M54.2] 06/28/2014  . Lumbar radiculopathy, chronic [M54.16] 06/28/2014  . Asthma, chronic [J45.909] 02/03/2014  . 3-vessel CAD [I25.10] 06/24/2013  . ED (erectile dysfunction) of organic origin [N52.9] 07/07/2012  . Hypertension goal BP (blood pressure) < 140/80 [I10] 04/29/2012  . Chondromalacia of left knee [M94.262] 03/19/2012  . Hyperlipidemia with target LDL less than 100 [E78.5] 02/12/2012  .  Fibromyalgia [M79.7] 02/12/2012  . HIV (human immunodeficiency virus infection) ([place]) [B20] 08/27/2011  . Uncontrolled type 2 diabetes with neuropathy ([place]) [E11.40, E11.65] 10/17/2000    Total Time spent with patient: 45 minutes  Musculoskeletal: Strength & Muscle Tone: within normal limits Gait & Station: normal Patient leans: N/A  Psychiatric Specialty Exam: Physical Exam  Nursing note and vitals reviewed. Constitutional: He is oriented to person, place, and time. He appears well-developed and well-nourished.  HENT:  Head: Normocephalic.  Neck: Normal range of motion.  Respiratory: Effort normal.  Musculoskeletal: Normal range of motion.  Neurological: He is alert and oriented to person, place, and time.  Psychiatric: His speech is normal and behavior is normal. Judgment and thought content normal. Cognition and memory are normal. He exhibits a depressed mood.    Review of Systems  Psychiatric/Behavioral: Positive for depression and substance abuse.  All other systems reviewed and are negative.   Blood pressure 119/83, pulse 68, temperature 98 F (36.7 C), temperature source Oral, resp. rate 18, weight 74.4 kg (164 lb), SpO2 99 %.Body mass index is 25.69 kg/m.  General Appearance: Casual  Eye Contact:  Good  Speech:  Normal Rate  Volume:  Normal  Mood:  Depressed  Affect:  Congruent  Thought Process:  Coherent and Descriptions of Associations: Intact  Orientation:  Full (Time, Place, and Person)  Thought Content:  WDL and Logical  Suicidal Thoughts:  No  Homicidal Thoughts:  No  Memory:  Immediate;   Good Recent;   Good Remote;   Good  Judgement:  Fair  Insight:  Fair  Psychomotor Activity:  Normal  Concentration:  Concentration: Good and Attention Span: Good  Recall:  Good  Fund of Knowledge:  Good  Language:  Good  Akathisia:  No  Handed:  Right  AIMS (if indicated):     Assets:  Leisure Time Physical Health Resilience  ADL's:  Intact  Cognition:  WNL   Sleep:       Mental Status Per Nursing Assessment::   On Admission:   cocaine abuse with suicidal ideations  Demographic Factors:  Male  Loss Factors: NA  Historical Factors: NA  Risk Reduction Factors:   Sense of responsibility to family, Living with another person, especially a relative, Positive social support and Positive therapeutic relationship  Continued Clinical Symptoms:  Depression , mild  Cognitive Features That Contribute To Risk:  None    Suicide Risk:  Minimal: No identifiable suicidal ideation.  Patients presenting with no risk factors but with morbid ruminations; may be classified as minimal risk based on the severity of the depressive symptoms    Plan Of Care/Follow-up recommendations:  Activity:  as tolerated Diet:  heart healthy diet  LORD, JAMISON, NP 04/18/2018, 11:00 AM

## 2018-04-18 NOTE — ED Notes (Signed)
Patient talking with peer support before leaving unit.

## 2018-04-18 NOTE — Consult Note (Addendum)
[facility] Consult   Reason for Consult:  Cocaine abuse with depression Referring Physician:  EDP Patient Identification: Michael [name] MRN:  [id number] Principal Diagnosis: Cocaine abuse with cocaine-induced mood disorder [facility]) Diagnosis:   Patient Active Problem List   Diagnosis Date Noted  . Cocaine abuse with cocaine-induced mood disorder [facility]) [F14.14] 08/14/2015    Priority: High  . Cocaine use disorder, severe, dependence ([place]) [F14.20] 12/13/2014    Priority: High  . Left testicular pain [N50.812]   . Depression [F32.9] 02/20/2018  . Epididymo-orchitis, acute [N45.3] 02/20/2018  . Essential hypertension [I10] 02/20/2018  . Chest pain [R07.9] 02/20/2018  . AKI (acute kidney injury) ([place]) [N17.9] 02/14/2018  . HCAP (healthcare-associated pneumonia) [J18.9] 02/14/2018  . Hepatitis C [B19.20] 12/30/2016  . Back pain [M54.9] 04/18/2016  . S/P carotid endarterectomy [Z98.890] 11/15/2015  . MDD (major depressive disorder), recurrent severe, without psychosis ([place]) [F33.2] 09/09/2015  . Cocaine-induced mood disorder ([place]) [F14.94] 08/14/2015  . Gout [M10.9] 07/10/2015  . CKD (chronic kidney disease) stage 3, GFR 30-59 ml/min ([name]) [N18.3] 03/06/2015  . Normocytic anemia [D64.9] 03/06/2015  . Hypoglycemia [E16.2]   . Encounter for general adult medical examination with abnormal findings [Z00.01] 02/09/2015  . Substance or medication-induced depressive disorder with onset during withdrawal [facility]) [H84.696, F19.24, F32.89] 12/13/2014  . Severe recurrent major depressive disorder with psychotic features ([place]) [F33.3] 12/12/2014  . Cervicalgia [M54.2] 06/28/2014  . Lumbar radiculopathy, chronic [M54.16] 06/28/2014  . Asthma, chronic [J45.909] 02/03/2014  . 3-vessel CAD [I25.10] 06/24/2013  . ED (erectile dysfunction) of organic origin [N52.9] 07/07/2012  . Hypertension goal BP (blood pressure) < 140/80 [I10] 04/29/2012  . Chondromalacia of left knee [M94.262]  03/19/2012  . Hyperlipidemia with target LDL less than 100 [E78.5] 02/12/2012  . Fibromyalgia [M79.7] 02/12/2012  . HIV (human immunodeficiency virus infection) ([place]) [B20] 08/27/2011  . Uncontrolled type 2 diabetes with neuropathy ([place]) [E11.40, E11.65] 10/17/2000    Total Time spent with patient: 45 minutes  Subjective:   Michael [name] is a 67 y.o. male patient does not warrant admission.  HPI:  67 yo male who presented to the ED with suicidal ideations after using cocaine.  Today, he is clear and coherent, sitting up and eating his breakfast.  Denies suicidal/homicidal ideations, hallucinations, and withdrawal symptoms.  He is interested in meeting with Peer support, consult placed.  He has been depressed, 3/10 and used cocaine to make things better but it made it worse.  His depression has been on and off for years with substance abuse being ongoing.  Stable for discharge.  Past Psychiatric History: substance abuse, depression  Risk to Self: None Risk to Others: Homicidal Ideation: No Thoughts of Harm to Others: No Current Homicidal Intent: No Current Homicidal Plan: No Access to Homicidal Means: No Identified Victim: NA History of harm to others?: No Assessment of Violence: None Noted Violent Behavior Description: NA Does patient have access to weapons?: No Criminal Charges Pending?: No Does patient have a court date: No Prior Inpatient Therapy: Prior Inpatient Therapy: Yes Prior Therapy Dates: 2019,2018 Prior Therapy Facilty/Provider(s): [facility] Reason for Treatment: MH issues Prior Outpatient Therapy: Prior Outpatient Therapy: Yes Prior Therapy Dates: 2018 Prior Therapy Facilty/Provider(s): [facility] Reason for Treatment: MH issues Does patient have an ACCT team?: No Does patient have Intensive In-House Services?  : No Does patient have [facility] services? : Yes(Per history in 2018) Does patient have P4[facility] services?: No  Past Medical History:  Past Medical  History:  Diagnosis Date  .  A-fib ([place])   . Asthma    No PFTs, history of childhood asthma  . CAD (coronary artery disease)    a. 06/2013 STEMI/PCI ([facility]): LAD w/ thrombus (treated with BMS), mid 75%, D2 75%; LCX OM2 75%; RCA small, PDA 95%, PLV 95%;  b. 10/2013 Cath/PCI: ISR w/in LAD (Promus DES x 2), borderline OM2 lesion;  c. 01/2014 MV: Intermediate risk, medium-sized distal ant wall infarct w/ very small amt of peri-infarct ischemia. EF 60%.  . Cellulitis 04/2014   left facial  . Chondromalacia of medial femoral condyle    Left knee MRI 04/28/12: Chondromalacia of the medial femoral condyle with slight peripheral degeneration of the meniscocapsular junction of the medial meniscus; followed by sports medicine  . Collagen vascular disease ([place])   . Crack cocaine use    for 20+ years, has been enrolled in detox programs in the past  . Depression    with history of hospitalization for suicidal ideation  . Diabetes mellitus 2002   Diagnosed in 2002, started insulin in 2012  . Gout   . Gout 04/28/2012  . Headache(784.0)    CT head 08/2011: Periventricular and subcortical white matter hypodensities are most in keeping with chronic microangiopathic change  . HIV infection [facility]) Nov 2012   Followed by [name]  . Hyperlipidemia   . Hypertension   . Pulmonary embolism [facility])     Past Surgical History:  Procedure Laterality Date  . BACK SURGERY     1988  . BOWEL RESECTION    . CARDIAC SURGERY    . CERVICAL SPINE SURGERY     " rods in my neck "  . CORONARY STENT PLACEMENT    . NM MYOCAR PERF WALL MOTION  12/27/2011   normal  . SPINE SURGERY     Family History:  Family History  Problem Relation Age of Onset  . Diabetes Mother   . Hypertension Mother   . Hyperlipidemia Mother   . Diabetes Father   . Cancer Father   . Hypertension Father   . Diabetes Brother   . Heart disease Brother   . Diabetes Sister   . Colon cancer Neg Hx    Family Psychiatric  History: none Social  History:  Social History   Substance and Sexual Activity  Alcohol Use No  . Alcohol/week: 2.4 oz  . Types: 2 Cans of beer, 2 Shots of liquor per week     Social History   Substance and Sexual Activity  Drug Use Yes  . Types: "Crack" cocaine, Cocaine   Comment: last use January 2018    Social History   Socioeconomic History  . Marital status: Divorced    Spouse name: Not on file  . Number of children: Not on file  . Years of education: 13  . Highest education level: Not on file  Occupational History    Employer: UNEMPLOYED    Comment: 04/2016  Social Needs  . Financial resource strain: Not on file  . Food insecurity:    Worry: Not on file    Inability: Not on file  . Transportation needs:    Medical: Not on file    Non-medical: Not on file  Tobacco Use  . Smoking status: Never Smoker  . Smokeless tobacco: Never Used  Substance and Sexual Activity  . Alcohol use: No    Alcohol/week: 2.4 oz    Types: 2 Cans of beer, 2 Shots of liquor per week  . Drug use: Yes  Types: "Crack" cocaine, Cocaine    Comment: last use January 2018  . Sexual activity: Yes    Comment: accepted condoms  Lifestyle  . Physical activity:    Days per week: Not on file    Minutes per session: Not on file  . Stress: Not on file  Relationships  . Social connections:    Talks on phone: Not on file    Gets together: Not on file    Attends religious service: Not on file    Active member of club or organization: Not on file    Attends meetings of clubs or organizations: Not on file    Relationship status: Not on file  Other Topics Concern  . Not on file  Social History Narrative   Currently staying with a friend in [place].  Was staying @ local motel until a few days ago - left b/c of bed bugs.   Additional Social History:    Allergies:  No Known Allergies  Labs:  Results for orders placed or performed during the hospital encounter of 04/17/18 (from the past 48 hour(s))  Basic metabolic  panel     Status: Abnormal   Collection Time: 04/17/18  2:51 PM  Result Value Ref Range   Sodium 138 135 - 145 mmol/L   Potassium 4.2 3.5 - 5.1 mmol/L   Chloride 103 98 - 111 mmol/L    Comment: Please note change in reference range.   CO2 25 22 - 32 mmol/L   Glucose, Bld 338 (H) 70 - 99 mg/dL    Comment: Please note change in reference range.   BUN 24 (H) 8 - 23 mg/dL    Comment: Please note change in reference range.   Creatinine, Ser 1.66 (H) 0.61 - 1.24 mg/dL   Calcium 9.6 8.9 - 10.3 mg/dL   GFR calc non Af Amer 41 (L) >60 mL/min   GFR calc Af Amer 48 (L) >60 mL/min    Comment: (NOTE) The eGFR has been calculated using the CKD EPI equation. This calculation has not been validated in all clinical situations. eGFR's persistently <60 mL/min signify possible Chronic Kidney Disease.    Anion gap 10 5 - 15    Comment: Performed at [facility], [place] [address] [place], [place] [address]  CBC     Status: Abnormal   Collection Time: 04/17/18  2:51 PM  Result Value Ref Range   WBC 4.6 4.0 - 10.5 K/uL   RBC 4.28 4.22 - 5.81 MIL/uL   Hemoglobin 11.4 (L) 13.0 - 17.0 g/dL   HCT 35.3 (L) 39.0 - 52.0 %   MCV 82.5 78.0 - 100.0 fL   MCH 26.6 26.0 - 34.0 pg   MCHC 32.3 30.0 - 36.0 g/dL   RDW 15.2 11.5 - 15.5 %   Platelets 148 (L) 150 - 400 K/uL    Comment: Performed at [facility], [place] [address] [place], [place] [address]  Ethanol     Status: None   Collection Time: 04/17/18  2:52 PM  Result Value Ref Range   Alcohol, Ethyl (B) <10 <10 mg/dL    Comment: (NOTE) Lowest detectable limit for serum alcohol is 10 mg/dL. For medical purposes only. Performed at [facility], [place] [address], [place], [place] [address]   I-stat troponin, ED (0, 3)     Status: None   Collection Time: 04/17/18  2:56 PM  Result Value Ref Range   Troponin i, poc 0.01 0.00 -  0.08 ng/mL   Comment 3            Comment: Due to the release kinetics of  cTnI, a negative result within the first hours of the onset of symptoms does not rule out myocardial infarction with certainty. If myocardial infarction is still suspected, repeat the test at appropriate intervals.   Rapid urine drug screen (hospital performed)     Status: Abnormal   Collection Time: 04/17/18  4:29 PM  Result Value Ref Range   Opiates NONE DETECTED NONE DETECTED   Cocaine POSITIVE (A) NONE DETECTED   Benzodiazepines NONE DETECTED NONE DETECTED   Amphetamines NONE DETECTED NONE DETECTED   Tetrahydrocannabinol NONE DETECTED NONE DETECTED   Barbiturates (A) NONE DETECTED    Result not available. Reagent lot number recalled by manufacturer.    Comment: Performed at [facility], [place] [address], [place], [place] [address]  CBG monitoring, ED     Status: Abnormal   Collection Time: 04/17/18  6:23 PM  Result Value Ref Range   Glucose-Capillary 288 (H) 70 - 99 mg/dL   Comment 1 Notify RN   I-stat troponin, ED (0, 3)     Status: None   Collection Time: 04/17/18  6:54 PM  Result Value Ref Range   Troponin i, poc 0.00 0.00 - 0.08 ng/mL   Comment 3            Comment: Due to the release kinetics of cTnI, a negative result within the first hours of the onset of symptoms does not rule out myocardial infarction with certainty. If myocardial infarction is still suspected, repeat the test at appropriate intervals.   CBG monitoring, ED     Status: Abnormal   Collection Time: 04/17/18  9:06 PM  Result Value Ref Range   Glucose-Capillary 252 (H) 70 - 99 mg/dL  CBG monitoring, ED     Status: Abnormal   Collection Time: 04/17/18 11:54 PM  Result Value Ref Range   Glucose-Capillary 147 (H) 70 - 99 mg/dL  CBG monitoring, ED     Status: Abnormal   Collection Time: 04/18/18  4:06 AM  Result Value Ref Range   Glucose-Capillary 182 (H) 70 - 99 mg/dL  CBG monitoring, ED     Status: Abnormal   Collection Time: 04/18/18  9:02 AM  Result Value Ref Range    Glucose-Capillary 222 (H) 70 - 99 mg/dL    Current Facility-Administered Medications  Medication Dose Route Frequency Provider Last Rate Last Dose  . albuterol (PROVENTIL HFA;VENTOLIN HFA) 108 (90 Base) MCG/ACT inhaler 1-2 puff  1-2 puff Inhalation Q6H PRN [name], MD      . allopurinol (ZYLOPRIM) tablet 300 mg  300 mg Oral Daily [name], MD   300 mg at 04/18/18 1003  . amantadine (SYMMETREL) capsule 100 mg  100 mg Oral Daily [name], MD   100 mg at 04/18/18 0921  . aspirin chewable tablet 81 mg  81 mg Oral Daily [name], MD   81 mg at 04/18/18 1751  . colchicine tablet 0.6 mg  0.6 mg Oral Daily [name], MD   0.6 mg at 04/18/18 1001  . diltiazem (CARDIZEM CD) 24 hr capsule 120 mg  120 mg Oral Daily [name], MD   120 mg at 04/18/18 1002  . emtricitabine-rilpivir-tenofovir AF (ODEFSEY) 200-25-25 MG per tablet 1 tablet  1 tablet Oral Q breakfast [name], MD   1 tablet at 04/18/18 1002  . famotidine (PEPCID) tablet 20  mg  20 mg Oral Daily [name], MD   20 mg at 04/18/18 0920  . gabapentin (NEURONTIN) capsule 400 mg  400 mg Oral BID [name], MD   400 mg at 04/18/18 0920  . hydrOXYzine (ATARAX/VISTARIL) tablet 25 mg  25 mg Oral TID PRN [name], MD      . insulin aspart (novoLOG) injection 5 Units  5 Units Subcutaneous TID WC Charlesetta Shanks, MD   5 Units at 04/18/18 [phone]  . insulin glargine (LANTUS) injection 30 Units  30 Units Subcutaneous QHS [name], MD   30 Units at 04/17/18 2224  . isosorbide mononitrate (IMDUR) 24 hr tablet 60 mg  60 mg Oral Daily [name], MD   60 mg at 04/18/18 0953  . metFORMIN (GLUCOPHAGE) tablet 1,000 mg  1,000 mg Oral BID WC [name], MD   1,000 mg at 04/18/18 0907  . pravastatin (PRAVACHOL) tablet 40 mg  40 mg Oral QHS [name], MD   40 mg at 04/17/18 2125  . risperiDONE (RISPERDAL) tablet 0.5 mg  0.5 mg Oral Daily [name], MD   0.5 mg at 04/18/18 0920  . sertraline (ZOLOFT) tablet 50 mg  50 mg Oral Daily [name], MD   50 mg at  04/18/18 0921  . traZODone (DESYREL) tablet 50 mg  50 mg Oral QHS PRN [name], MD   50 mg at 04/17/18 2217   Current Outpatient Medications  Medication Sig Dispense Refill  . albuterol (PROAIR HFA) 108 (90 Base) MCG/ACT inhaler INHALE TWO PUFFS INTO THE LUNGS EVERY 6 (SIX) HOURS AS NEEDED FOR WHEEZING OR SHORTNESS OF BREATH. 8.5 Inhaler 0  . allopurinol (ZYLOPRIM) 300 MG tablet Take 1 tablet (300 mg total) by mouth daily. For gout. 30 tablet 0  . amantadine (SYMMETREL) 100 MG capsule Take 100 mg by mouth daily.    Marland Kitchen aspirin 81 MG chewable tablet Chew 81 mg by mouth daily.    . colchicine 0.6 MG tablet Take 1 tablet (0.6 mg total) by mouth daily.    Marland Kitchen diltiazem (CARDIZEM CD) 120 MG 24 hr capsule Take 1 capsule (120 mg total) by mouth daily. For high blood pressure 30 capsule 0  . emtricitabine-rilpivir-tenofovir AF (ODEFSEY) 200-25-25 MG TABS tablet Take 1 tablet by mouth daily with breakfast. For HIV infection 1 tablet 0  . famotidine (PEPCID) 20 MG tablet Take 1 tablet (20 mg total) by mouth daily. For acid reflux 30 tablet 0  . gabapentin (NEURONTIN) 400 MG capsule Take 1 capsule (400 mg total) by mouth 2 (two) times daily. For agitation/neuropathic pain (Patient taking differently: Take 400 mg by mouth 2 (two) times daily as needed (For agitation/neuropathic pain). ) 60 capsule 0  . hydrOXYzine (ATARAX/VISTARIL) 25 MG tablet Take 1 tablet (25 mg total) by mouth 3 (three) times daily as needed for anxiety. 90 tablet 0  . insulin glargine (LANTUS) 100 UNIT/ML injection Inject 0.3 mLs (30 Units total) into the skin at bedtime. For diabetes management 10 mL 0  . insulin lispro (HUMALOG) 100 UNIT/ML injection Inject 0.05 mLs (5 Units total) into the skin 3 (three) times daily with meals. For diabetes management (Patient taking differently: Inject 4 Units into the skin 3 (three) times daily with meals. For diabetes management) 10 mL 0  . isosorbide mononitrate (IMDUR) 30 MG 24 hr tablet Take 2  tablets (60 mg total) by mouth daily. For chest 30 tablet 0  . metFORMIN (GLUCOPHAGE) 1000 MG tablet Take 1 tablet (1,000  mg total) by mouth 2 (two) times daily with a meal. For diabetes management 60 tablet 0  . oxyCODONE (OXY IR/ROXICODONE) 5 MG immediate release tablet Take 1 tablet (5 mg total) by mouth every 6 (six) hours as needed for moderate pain. 20 tablet 0  . pravastatin (PRAVACHOL) 40 MG tablet Take 1 tablet (40 mg total) by mouth at bedtime. For high cholesterol 30 tablet 0  . PROAIR HFA 108 (90 Base) MCG/ACT inhaler INHALE TWO PUFFS INTO THE LUNGS EVERY 6 (SIX) HOURS AS NEEDED FOR WHEEZING OR SHORTNESS OF BREATH. 8.5 Inhaler 2  . risperiDONE (RISPERDAL) 0.5 MG tablet Take 0.5 mg by mouth daily.    . sertraline (ZOLOFT) 50 MG tablet Take 1 tablet (50 mg total) by mouth daily. For depression 30 tablet 0  . traZODone (DESYREL) 50 MG tablet Take 1 tablet (50 mg total) by mouth at bedtime as needed for sleep. 30 tablet 0  . ACCU-CHEK SOFTCLIX LANCETS lancets USE TO CHECK BLOOD GLUCOSE LEVELS UP TO 4 TIMES DAILY AS DIRECTED 100 each 5  . Insulin Syringe-Needle U-100 (B-D INS SYR MICROFINE 1CC/27G) 27G X 5/8" 1 ML MISC 1 strip by Does not apply route 3 (three) times daily as needed. For blood sugar checks 100 each 0  . polyethylene glycol (MIRALAX / GLYCOLAX) packet Take 17 g by mouth daily. (Patient not taking: Reported on 04/17/2018) 14 each 0  . senna (SENOKOT) 8.6 MG TABS tablet Take 2 tablets (17.2 mg total) by mouth at bedtime as needed for mild constipation. (Patient not taking: Reported on 04/17/2018) 120 each 0    Musculoskeletal: Strength & Muscle Tone: within normal limits Gait & Station: normal Patient leans: N/A  Psychiatric Specialty Exam: Physical Exam  Nursing note and vitals reviewed. Constitutional: He is oriented to person, place, and time. He appears well-developed and well-nourished.  HENT:  Head: Normocephalic.  Neck: Normal range of motion.  Respiratory: Effort  normal.  Musculoskeletal: Normal range of motion.  Neurological: He is alert and oriented to person, place, and time.  Psychiatric: His speech is normal and behavior is normal. Judgment and thought content normal. Cognition and memory are normal. He exhibits a depressed mood.    Review of Systems  Psychiatric/Behavioral: Positive for depression and substance abuse.  All other systems reviewed and are negative.   Blood pressure 119/83, pulse 68, temperature 98 F (36.7 C), temperature source Oral, resp. rate 18, weight 74.4 kg (164 lb), SpO2 99 %.Body mass index is 25.69 kg/m.  General Appearance: Casual  Eye Contact:  Good  Speech:  Normal Rate  Volume:  Normal  Mood:  Depressed  Affect:  Congruent  Thought Process:  Coherent and Descriptions of Associations: Intact  Orientation:  Full (Time, Place, and Person)  Thought Content:  WDL and Logical  Suicidal Thoughts:  No  Homicidal Thoughts:  No  Memory:  Immediate;   Good Recent;   Good Remote;   Good  Judgement:  Fair  Insight:  Fair  Psychomotor Activity:  Normal  Concentration:  Concentration: Good and Attention Span: Good  Recall:  Good  Fund of Knowledge:  Good  Language:  Good  Akathisia:  No  Handed:  Right  AIMS (if indicated):     Assets:  Leisure Time Physical Health Resilience  ADL's:  Intact  Cognition:  WNL  Sleep:        Treatment Plan Summary: Cocaine abuse with cocaine induced mood disorder: -Restarted Risperdal 0.5 mg daily for mood stabilization -Restarted  Zoloft 50 mg daily for depression -Restarted hydroxyzine 25 mg TID PRN anxiety Restarted gabapentin 400 mg BID for cocaine abuse  Disposition: No evidence of imminent risk to self or others at present.    Waylan Boga, NP 04/18/2018 10:22 AM   Patient seen face to face for this evaluation, case discussed with treatment team and physician extender and formulated treatment plan. Reviewed the information documented and agree with the treatment  plan.  Ambrose Finland, MD 04/18/2018

## 2018-04-18 NOTE — Patient Outreach (Signed)
ED Peer Support Specialist Patient Intake (Complete at intake & 30-60 Day Follow-up)  Name: Michael [name]  MRN: [id number]  Age: 67 y.o.   Date of Admission: 04/18/2018  Intake: Initial Comments:      Primary Reason Admitted: Michael [name] is an 67 y.o. male that presents this date with S/I. Patient denies any H/I or AVH although voices thoughts of self harm with a plan to run into traffic. Patient has recently had open heart surgery two months ago and reports ongoing cocaine (crack) use stating he has been using up to 2 grams a day since he was released from his post operative care. Patient reports last use earlier this date when he stated he used a gram of cocaine. Patient denies any other SA history at this time. Patient reports ongoing anxiety and tightness in his chest he believes is associated with cocaine withdrawals and ongoing heart issues. Patient denies being on any current medication regimen to assist with symptoms of depression. Patient speaks in a low soft voice and makes limited eye contact with this Probation officer. Patient is oriented x 4 although renders limited treatment history. Patient states he was receiving services from [facility] over one year ago who assisted with medication management to assist with ongoing depression. Patient stated he cannot recall when he was initially diagnosed with depression but reports ongoing symptoms to include: guilt and feeling worthless. Per chart review patient has been seen multiple times within the last year by area providers with last assessment noted on 04/12/18 when patient presented with S/I and H/I. Patient states he has not followed up with aftercare due to multiple reasons to include: limited transportation, residing in a high risk environment where there is ongoing drug use and states "that kind of treatment does not work for me." Patient is requesting this date assistance with SA issues and be evaluated for medication to assist with  depression. Per notes, "Patient presents to [facility] with complaints of suicidal ideations with no defined plan. Patient report he abuses crack cocaine and his suicidal ideations are related to his substance use. Patient report, "I cannot stop smoking crack cocaine," Patient last used crack earlier this date. Patient report he uses crack due to his current housing situation with a roommate that also uses. Patient denies homicidal ideations, visual and auditory hallucinations". Case was staffed with parks FNP who recommended patient be monitored and observed for safety. Patient will also be evaluated for possible medication interventions to assist with symptom management. Patient will be seen by psychiatry in the a.m.    Lab values: Alcohol/ETOH: Positive Positive UDS? Yes Amphetamines: No Barbiturates: No Benzodiazepines: No Cocaine: Yes Opiates: No Cannabinoids: No  Demographic information: Gender: Male Ethnicity: African American Marital Status: Single Insurance Status: ([place]) Ecologist (Work Neurosurgeon, Physicist, medical, Social research officer, government.: Yes(SSI ) Lives with: Partner/Spouse Living situation: House/Apartment  Reported Patient History: Patient reported health conditions: Heart disease, Diabetes Patient aware of HIV and hepatitis status: Yes (comment)(V[facility])  In past year, has patient visited ED for any reason? Yes  Number of ED visits: 7  Reason(s) for visit: Various reasons   In past year, has patient been hospitalized for any reason? No  Number of hospitalizations:    Reason(s) for hospitalization:    In past year, has patient been arrested? No  Number of arrests:    Reason(s) for arrest:    In past year, has patient been incarcerated? No  Number of incarcerations:    Reason(s) for  incarceration:    In past year, has patient received medication-assisted treatment? No  In past year, patient received the following treatments:    In past  year, has patient received any harm reduction services? No  Did this include any of the following?    In past year, has patient received care from a mental health provider for diagnosis other than SUD? No  In past year, is this first time patient has overdosed? No  Number of past overdoses:    In past year, is this first time patient has been hospitalized for an overdose? No  Number of hospitalizations for overdose(s):    Is patient currently receiving treatment for a mental health diagnosis? No  Patient reports experiencing difficulty participating in SUD treatment: No    Most important reason(s) for this difficulty?    Has patient received prior services for treatment? Unsure  In past, patient has received services from following agencies:    Plan of Care:  Suggested follow up at these agencies/treatment centers: [facility]  Other information: CPSS met with Pt and was able to gain information to help understand reason for Pt visit. CPSS was able to complete the series of questions. CPSS dicussed the Pts concerns about seeking treatment for substance abuse. CPSS made Pt aware that because of the heart sugary which will be a somewhat of a problem. CPSS made Pt aware that Pt may benefit from Out Patient treatment. CPSS gave Pt information to for a few facilities that were closed today. CPSS gave contact information for Pt to follow up with CPSS on Monday to try to get Pt into a facility.     Aaron Edelman Gibson Lad, CPSS  04/18/2018 12:17 PM

## 2018-04-22 ENCOUNTER — Ambulatory Visit: Payer: Self-pay | Admitting: Infectious Diseases

## 2018-05-03 ENCOUNTER — Observation Stay (HOSPITAL_COMMUNITY)
Admission: EM | Admit: 2018-05-03 | Discharge: 2018-05-05 | Disposition: A | Discharge status: Home / Self Care | Payer: Medicare Other | Attending: Internal Medicine | Admitting: Internal Medicine

## 2018-05-03 ENCOUNTER — Emergency Department (HOSPITAL_COMMUNITY): Payer: Medicare Other

## 2018-05-03 ENCOUNTER — Encounter (HOSPITAL_COMMUNITY): Payer: Self-pay | Admitting: Emergency Medicine

## 2018-05-03 ENCOUNTER — Other Ambulatory Visit: Payer: Self-pay

## 2018-05-03 ENCOUNTER — Emergency Department (HOSPITAL_COMMUNITY)
Admission: EM | Admit: 2018-05-03 | Discharge: 2018-05-03 | Disposition: A | Discharge status: Home / Self Care | Payer: Medicare Other | Source: Home / Self Care | Attending: Emergency Medicine | Admitting: Emergency Medicine

## 2018-05-03 DIAGNOSIS — B20 Human immunodeficiency virus [HIV] disease: Secondary | ICD-10-CM

## 2018-05-03 DIAGNOSIS — Z794 Long term (current) use of insulin: Secondary | ICD-10-CM | POA: Insufficient documentation

## 2018-05-03 DIAGNOSIS — D631 Anemia in chronic kidney disease: Secondary | ICD-10-CM | POA: Insufficient documentation

## 2018-05-03 DIAGNOSIS — R072 Precordial pain: Secondary | ICD-10-CM

## 2018-05-03 DIAGNOSIS — I129 Hypertensive chronic kidney disease with stage 1 through stage 4 chronic kidney disease, or unspecified chronic kidney disease: Secondary | ICD-10-CM | POA: Insufficient documentation

## 2018-05-03 DIAGNOSIS — Z86718 Personal history of other venous thrombosis and embolism: Secondary | ICD-10-CM

## 2018-05-03 DIAGNOSIS — E1122 Type 2 diabetes mellitus with diabetic chronic kidney disease: Secondary | ICD-10-CM

## 2018-05-03 DIAGNOSIS — I4891 Unspecified atrial fibrillation: Secondary | ICD-10-CM | POA: Insufficient documentation

## 2018-05-03 DIAGNOSIS — F32A Depression, unspecified: Secondary | ICD-10-CM | POA: Diagnosis present

## 2018-05-03 DIAGNOSIS — Z955 Presence of coronary angioplasty implant and graft: Secondary | ICD-10-CM | POA: Diagnosis not present

## 2018-05-03 DIAGNOSIS — R0602 Shortness of breath: Secondary | ICD-10-CM | POA: Insufficient documentation

## 2018-05-03 DIAGNOSIS — E1129 Type 2 diabetes mellitus with other diabetic kidney complication: Secondary | ICD-10-CM | POA: Diagnosis present

## 2018-05-03 DIAGNOSIS — R0600 Dyspnea, unspecified: Secondary | ICD-10-CM

## 2018-05-03 DIAGNOSIS — Z21 Asymptomatic human immunodeficiency virus [HIV] infection status: Secondary | ICD-10-CM | POA: Diagnosis present

## 2018-05-03 DIAGNOSIS — N183 Chronic kidney disease, stage 3 unspecified: Secondary | ICD-10-CM | POA: Diagnosis present

## 2018-05-03 DIAGNOSIS — F1410 Cocaine abuse, uncomplicated: Secondary | ICD-10-CM | POA: Diagnosis not present

## 2018-05-03 DIAGNOSIS — Z7982 Long term (current) use of aspirin: Secondary | ICD-10-CM | POA: Insufficient documentation

## 2018-05-03 DIAGNOSIS — Z79899 Other long term (current) drug therapy: Secondary | ICD-10-CM

## 2018-05-03 DIAGNOSIS — Z86711 Personal history of pulmonary embolism: Secondary | ICD-10-CM | POA: Diagnosis not present

## 2018-05-03 DIAGNOSIS — Z951 Presence of aortocoronary bypass graft: Secondary | ICD-10-CM

## 2018-05-03 DIAGNOSIS — I2510 Atherosclerotic heart disease of native coronary artery without angina pectoris: Secondary | ICD-10-CM | POA: Insufficient documentation

## 2018-05-03 DIAGNOSIS — K219 Gastro-esophageal reflux disease without esophagitis: Secondary | ICD-10-CM | POA: Diagnosis present

## 2018-05-03 DIAGNOSIS — I252 Old myocardial infarction: Secondary | ICD-10-CM | POA: Diagnosis not present

## 2018-05-03 DIAGNOSIS — E785 Hyperlipidemia, unspecified: Secondary | ICD-10-CM | POA: Diagnosis present

## 2018-05-03 DIAGNOSIS — I10 Essential (primary) hypertension: Secondary | ICD-10-CM | POA: Diagnosis present

## 2018-05-03 DIAGNOSIS — R079 Chest pain, unspecified: Secondary | ICD-10-CM | POA: Diagnosis not present

## 2018-05-03 DIAGNOSIS — R2689 Other abnormalities of gait and mobility: Secondary | ICD-10-CM | POA: Diagnosis not present

## 2018-05-03 DIAGNOSIS — J45909 Unspecified asthma, uncomplicated: Secondary | ICD-10-CM

## 2018-05-03 DIAGNOSIS — M109 Gout, unspecified: Secondary | ICD-10-CM | POA: Diagnosis present

## 2018-05-03 DIAGNOSIS — F329 Major depressive disorder, single episode, unspecified: Secondary | ICD-10-CM | POA: Diagnosis not present

## 2018-05-03 DIAGNOSIS — N179 Acute kidney failure, unspecified: Secondary | ICD-10-CM | POA: Diagnosis present

## 2018-05-03 DIAGNOSIS — F1420 Cocaine dependence, uncomplicated: Secondary | ICD-10-CM | POA: Diagnosis present

## 2018-05-03 DIAGNOSIS — I481 Persistent atrial fibrillation: Secondary | ICD-10-CM | POA: Diagnosis not present

## 2018-05-03 LAB — CBC
HEMATOCRIT: 33.1 % — AB (ref 39.0–52.0)
HEMATOCRIT: 37 % — AB (ref 39.0–52.0)
HEMOGLOBIN: 11.6 g/dL — AB (ref 13.0–17.0)
Hemoglobin: 10.5 g/dL — ABNORMAL LOW (ref 13.0–17.0)
MCH: 26.4 pg (ref 26.0–34.0)
MCH: 26.4 pg (ref 26.0–34.0)
MCHC: 31.4 g/dL (ref 30.0–36.0)
MCHC: 31.7 g/dL (ref 30.0–36.0)
MCV: 84.3 fL (ref 78.0–100.0)
MCV: 83.4 fL (ref 78.0–100.0)
PLATELETS: 205 10*3/uL (ref 150–400)
Platelets: 228 10*3/uL (ref 150–400)
RBC: 3.97 MIL/uL — AB (ref 4.22–5.81)
RBC: 4.39 MIL/uL (ref 4.22–5.81)
RDW: 14.1 % (ref 11.5–15.5)
RDW: 13.7 % (ref 11.5–15.5)
WBC: 4.1 10*3/uL (ref 4.0–10.5)
WBC: 5.6 10*3/uL (ref 4.0–10.5)

## 2018-05-03 LAB — BASIC METABOLIC PANEL
ANION GAP: 10 (ref 5–15)
ANION GAP: 12 (ref 5–15)
BUN: 25 mg/dL — ABNORMAL HIGH (ref 8–23)
BUN: 28 mg/dL — AB (ref 8–23)
CO2: 23 mmol/L (ref 22–32)
CO2: 23 mmol/L (ref 22–32)
Calcium: 9 mg/dL (ref 8.9–10.3)
Calcium: 9.1 mg/dL (ref 8.9–10.3)
Chloride: 101 mmol/L (ref 98–111)
Chloride: 101 mmol/L (ref 98–111)
Creatinine, Ser: 1.94 mg/dL — ABNORMAL HIGH (ref 0.61–1.24)
Creatinine, Ser: 2 mg/dL — ABNORMAL HIGH (ref 0.61–1.24)
GFR calc Af Amer: 38 mL/min — ABNORMAL LOW (ref >60)
GFR, EST AFRICAN AMERICAN: 39 mL/min — AB (ref >60)
GFR, EST NON AFRICAN AMERICAN: 33 mL/min — AB (ref >60)
GFR, EST NON AFRICAN AMERICAN: 34 mL/min — AB (ref >60)
Glucose, Bld: 301 mg/dL — ABNORMAL HIGH (ref 70–99)
Glucose, Bld: 506 mg/dL (ref 70–99)
POTASSIUM: 4.2 mmol/L (ref 3.5–5.1)
POTASSIUM: 4.3 mmol/L (ref 3.5–5.1)
SODIUM: 134 mmol/L — AB (ref 135–145)
SODIUM: 136 mmol/L (ref 135–145)

## 2018-05-03 LAB — I-STAT TROPONIN, ED
Troponin i, poc: 0 ng/mL (ref 0.00–0.08)
Troponin i, poc: 0 ng/mL (ref 0.00–0.08)
Troponin i, poc: 0 ng/mL (ref 0.00–0.08)

## 2018-05-03 MED ORDER — ASPIRIN 81 MG PO CHEW
324.0000 mg | CHEWABLE_TABLET | Freq: Once | ORAL | Status: AC
  Administered 2018-05-03: 324 mg via ORAL
  Filled 2018-05-03: qty 4

## 2018-05-03 NOTE — ED Triage Notes (Addendum)
PT reports ongoing chest pain and sob since May.  Seen this am for same.  Hx of CABG in April.

## 2018-05-03 NOTE — ED Triage Notes (Addendum)
Pt states he had CABG recently.  Increased SHOB tonight.  States last cocaine use a month ago. Endorses chest pain

## 2018-05-03 NOTE — ED Provider Notes (Signed)
[facility] EMERGENCY DEPARTMENT Provider Note   CSN: [id number] Arrival date & time: 05/03/18  0026     History   Chief Complaint Chief Complaint  Patient presents with  . Shortness of Breath    HPI Michael [name] is a 67 y.o. male.  HPI  67 year old male with history of A. fib, CAD status post PCI's and a CABG at [facility] earlier in the year, collagen vascular disease and remote history of crack use (patient has been clean for 2 months) and HIV comes in with chief complaint of chest pain.  Patient reports that he has been having left-sided chest discomfort for the last few days.  Patient also has noted some exertional dyspnea.  Chest pain is intermittent, and there is no specific evoking, aggravating or relieving factors.  Patient denies any associated nausea, vomiting, fevers or chills.  Patient also denies any new cough.    Past Medical History:  Diagnosis Date  . A-fib ([place])   . Asthma    No PFTs, history of childhood asthma  . CAD (coronary artery disease)    a. 06/2013 STEMI/PCI ([facility]): LAD w/ thrombus (treated with BMS), mid 75%, D2 75%; LCX OM2 75%; RCA small, PDA 95%, PLV 95%;  b. 10/2013 Cath/PCI: ISR w/in LAD (Promus DES x 2), borderline OM2 lesion;  c. 01/2014 MV: Intermediate risk, medium-sized distal ant wall infarct w/ very small amt of peri-infarct ischemia. EF 60%.  . Cellulitis 04/2014   left facial  . Chondromalacia of medial femoral condyle    Left knee MRI 04/28/12: Chondromalacia of the medial femoral condyle with slight peripheral degeneration of the meniscocapsular junction of the medial meniscus; followed by sports medicine  . Collagen vascular disease ([place])   . Crack cocaine use    for 20+ years, has been enrolled in detox programs in the past  . Depression    with history of hospitalization for suicidal ideation  . Diabetes mellitus 2002   Diagnosed in 2002, started insulin in 2012  . Gout   . Gout 04/28/2012  .  Headache(784.0)    CT head 08/2011: Periventricular and subcortical white matter hypodensities are most in keeping with chronic microangiopathic change  . HIV infection [facility]) Nov 2012   Followed by [name]  . Hyperlipidemia   . Hypertension   . Pulmonary embolism [facility])     Patient Active Problem List   Diagnosis Date Noted  . Left testicular pain   . Depression 02/20/2018  . Epididymo-orchitis, acute 02/20/2018  . Essential hypertension 02/20/2018  . Chest pain 02/20/2018  . AKI (acute kidney injury) ([place]) 02/14/2018  . HCAP (healthcare-associated pneumonia) 02/14/2018  . Hepatitis C 12/30/2016  . Back pain 04/18/2016  . S/P carotid endarterectomy 11/15/2015  . MDD (major depressive disorder), recurrent severe, without psychosis ([place]) 09/09/2015  . Cocaine-induced mood disorder ([place]) 08/14/2015  . Cocaine abuse with cocaine-induced mood disorder ([place]) 08/14/2015  . Gout 07/10/2015  . CKD (chronic kidney disease) stage 3, GFR 30-59 ml/min ([name]) 03/06/2015  . Normocytic anemia 03/06/2015  . Hypoglycemia   . Encounter for general adult medical examination with abnormal findings 02/09/2015  . Cocaine use disorder, severe, dependence ([place]) 12/13/2014  . Substance or medication-induced depressive disorder with onset during withdrawal ([place]) 12/13/2014  . Severe recurrent major depressive disorder with psychotic features ([place]) 12/12/2014  . Cervicalgia 06/28/2014  . Lumbar radiculopathy, chronic 06/28/2014  . Asthma, chronic 02/03/2014  . 3-vessel CAD 06/24/2013  . ED (erectile dysfunction) of  organic origin 07/07/2012  . Hypertension goal BP (blood pressure) < 140/80 04/29/2012  . Chondromalacia of left knee 03/19/2012  . Hyperlipidemia with target LDL less than 100 02/12/2012  . Fibromyalgia 02/12/2012  . HIV (human immunodeficiency virus infection) ([place]) 08/27/2011  . Uncontrolled type 2 diabetes with neuropathy ([place]) 10/17/2000    Past Surgical History:  Procedure  Laterality Date  . BACK SURGERY     1988  . BOWEL RESECTION    . CARDIAC SURGERY    . CERVICAL SPINE SURGERY     " rods in my neck "  . CORONARY ARTERY BYPASS GRAFT    . CORONARY STENT PLACEMENT    . NM MYOCAR PERF WALL MOTION  12/27/2011   normal  . SPINE SURGERY          Home Medications    Prior to Admission medications   Medication Sig Start Date End Date Taking? Authorizing Provider  allopurinol (ZYLOPRIM) 300 MG tablet Take 1 tablet (300 mg total) by mouth daily. For gout. 11/25/17  Yes Lindell Spar I, NP  amantadine (SYMMETREL) 100 MG capsule Take 100 mg by mouth daily. 02/14/18  Yes [provider]  aspirin 81 MG chewable tablet Chew 81 mg by mouth daily.   Yes [provider]  colchicine 0.6 MG tablet Take 1 tablet (0.6 mg total) by mouth daily. 02/27/18  Yes Merlene Laughter F, NP  diltiazem (CARDIZEM CD) 120 MG 24 hr capsule Take 1 capsule (120 mg total) by mouth daily. For high blood pressure 11/25/17  Yes Lindell Spar I, NP  emtricitabine-rilpivir-tenofovir AF (ODEFSEY) 200-25-25 MG TABS tablet Take 1 tablet by mouth daily with breakfast. For HIV infection 11/24/17  Yes Nwoko, Herbert Pun I, NP  famotidine (PEPCID) 20 MG tablet Take 1 tablet (20 mg total) by mouth daily. For acid reflux 11/24/17  Yes Lindell Spar I, NP  gabapentin (NEURONTIN) 400 MG capsule Take 1 capsule (400 mg total) by mouth 2 (two) times daily. For agitation/neuropathic pain Patient taking differently: Take 400 mg by mouth 2 (two) times daily as needed (For agitation/neuropathic pain).  11/24/17  Yes Lindell Spar I, NP  hydrOXYzine (ATARAX/VISTARIL) 25 MG tablet Take 1 tablet (25 mg total) by mouth 3 (three) times daily as needed for anxiety. 11/24/17  Yes Lindell Spar I, NP  insulin glargine (LANTUS) 100 UNIT/ML injection Inject 0.3 mLs (30 Units total) into the skin at bedtime. For diabetes management 11/24/17  Yes Lindell Spar I, NP  insulin lispro (HUMALOG) 100 UNIT/ML injection Inject 0.05 mLs  (5 Units total) into the skin 3 (three) times daily with meals. For diabetes management Patient taking differently: Inject 4 Units into the skin 3 (three) times daily with meals. For diabetes management 11/24/17  Yes Lindell Spar I, NP  isosorbide mononitrate (IMDUR) 30 MG 24 hr tablet Take 2 tablets (60 mg total) by mouth daily. For chest 02/26/18  Yes Merlene Laughter F, NP  metFORMIN (GLUCOPHAGE) 1000 MG tablet Take 1 tablet (1,000 mg total) by mouth 2 (two) times daily with a meal. For diabetes management 11/24/17  Yes Lindell Spar I, NP  pravastatin (PRAVACHOL) 40 MG tablet Take 1 tablet (40 mg total) by mouth at bedtime. For high cholesterol 11/24/17  Yes Nwoko, Herbert Pun I, NP  PROAIR HFA 108 (90 Base) MCG/ACT inhaler INHALE TWO PUFFS INTO THE LUNGS EVERY 6 (SIX) HOURS AS NEEDED FOR WHEEZING OR SHORTNESS OF BREATH. 02/24/18  Yes [name], MD  risperiDONE (RISPERDAL) 0.5 MG tablet Take 0.5  mg by mouth daily. 06/16/17  Yes [provider]  sertraline (ZOLOFT) 50 MG tablet Take 1 tablet (50 mg total) by mouth daily. For depression 11/25/17  Yes Lindell Spar I, NP  traZODone (DESYREL) 50 MG tablet Take 1 tablet (50 mg total) by mouth at bedtime as needed for sleep. 11/24/17  Yes Nwoko, Herbert Pun I, NP  ACCU-CHEK SOFTCLIX LANCETS lancets USE TO CHECK BLOOD GLUCOSE LEVELS UP TO 4 TIMES DAILY AS DIRECTED 11/24/17   Lindell Spar I, NP  albuterol (PROAIR HFA) 108 (90 Base) MCG/ACT inhaler INHALE TWO PUFFS INTO THE LUNGS EVERY 6 (SIX) HOURS AS NEEDED FOR WHEEZING OR SHORTNESS OF BREATH. Patient not taking: Reported on 05/03/2018 11/24/17   Lindell Spar I, NP  Insulin Syringe-Needle U-100 (B-D INS SYR MICROFINE 1CC/27G) 27G X 5/8" 1 ML MISC 1 strip by Does not apply route 3 (three) times daily as needed. For blood sugar checks 11/24/17   Encarnacion Slates, NP    Family History Family History  Problem Relation Age of Onset  . Diabetes Mother   . Hypertension Mother   . Hyperlipidemia Mother   . Diabetes  Father   . Cancer Father   . Hypertension Father   . Diabetes Brother   . Heart disease Brother   . Diabetes Sister   . Colon cancer Neg Hx     Social History Social History   Tobacco Use  . Smoking status: Never Smoker  . Smokeless tobacco: Never Used  Substance Use Topics  . Alcohol use: No    Alcohol/week: 2.4 oz    Types: 2 Cans of beer, 2 Shots of liquor per week  . Drug use: Yes    Types: "Crack" cocaine, Cocaine    Comment: last use January 2018     Allergies   Patient has no known allergies.   Review of Systems Review of Systems  Respiratory: Positive for shortness of breath.   Cardiovascular: Positive for chest pain.  Gastrointestinal: Negative for nausea.  Allergic/Immunologic: Positive for immunocompromised state.  All other systems reviewed and are negative.    Physical Exam Updated Vital Signs BP (!) 139/100   Pulse 93   Temp 97.9 F (36.6 C) (Oral)   Resp 14   SpO2 100%   Physical Exam  Constitutional: He is oriented to person, place, and time. He appears well-developed.  HENT:  Head: Atraumatic.  Neck: Neck supple.  Cardiovascular: Normal rate and intact distal pulses.  Pulmonary/Chest: Effort normal and breath sounds normal.  Neurological: He is alert and oriented to person, place, and time.  Skin: Skin is warm.  Nursing note and vitals reviewed.    ED Treatments / Results  Labs (all labs ordered are listed, but only abnormal results are displayed) Labs Reviewed  BASIC METABOLIC PANEL - Abnormal; Notable for the following components:      Result Value   Glucose, Bld 301 (*)    BUN 25 (*)    Creatinine, Ser 1.94 (*)    GFR calc non Af Amer 34 (*)    GFR calc Af Amer 39 (*)    All other components within normal limits  CBC - Abnormal; Notable for the following components:   Hemoglobin 11.6 (*)    HCT 37.0 (*)    All other components within normal limits  I-STAT TROPONIN, ED  I-STAT TROPONIN, ED    EKG EKG  Interpretation  Date/Time:  Sunday May 03 2018 00:31:49 EDT Ventricular Rate:  109 PR Interval:  126 QRS Duration: 82 QT Interval:  316 QTC Calculation: 425 R Axis:   62 Text Interpretation:  Sinus tachycardia Otherwise normal ECG No acute changes No significant change since last tracing Confirmed by Varney Biles [phone]) on 05/03/2018 2:52:36 AM Also confirmed by Varney Biles [phone]), editor Shon Hale [phone])  on 05/03/2018 9:02:02 AM   Radiology Dg Chest 2 View  Result Date: 05/03/2018 CLINICAL DATA:  Chest pain EXAM: CHEST - 2 VIEW COMPARISON:  05/03/2018 FINDINGS: Lungs are clear.  No pleural effusion or pneumothorax. The heart is normal in size. Postsurgical changes related to prior CABG. Degenerative changes of the thoracic spine. Cervical spine fixation hardware. Median sternotomy. IMPRESSION: No evidence of acute cardiopulmonary disease. Electronically Signed   By: Julian Hy M.D.   On: 05/03/2018 23:32   Dg Chest 2 View  Result Date: 05/03/2018 CLINICAL DATA:  Chest pain EXAM: CHEST - 2 VIEW COMPARISON:  04/17/2018, CT chest 04/11/2018 FINDINGS: Postsurgical changes of the cervical spine. Post sternotomy. Mild elevation of right diaphragm. No acute airspace disease or effusion. Residual opacity in the medial right base. Stable cardiomediastinal silhouette. No pneumothorax. Degenerative changes of the spine. IMPRESSION: No acute interval change since 04/17/2018. Persistent mild opacity in the medial right base, may reflect slowly resolving pneumonia. Electronically Signed   By: Donavan Foil M.D.   On: 05/03/2018 01:28    Procedures Procedures (including critical care time)  Medications Ordered in ED Medications  aspirin chewable tablet 324 mg (324 mg Oral Given 05/03/18 0438)     Initial Impression / Assessment and Plan / ED Course  I have reviewed the triage vital signs and the nursing notes.  Pertinent labs & imaging results that were available during my  care of the patient were reviewed by me and considered in my medical decision making (see chart for details).     67 year old male comes in with chief complaint of chest pain.  Patient has history of coronary artery disease status post multiple PCI's and CABG.  Patient's last CABG was few months ago at [facility].. He has been going to the cardiac rehab, but not followed up with cardiologist.  Patient has pain that is typical of his ACS type pain, and he reports that he has been having this type of pain intermittently ever since the CABG.  There is no specific aggravating or relieving factors, specifically patient does not endorse exertional component to this pain.  Review of system is positive for shortness of breath.  At the moment patient is chest pain-free.  EKG does not show any acute findings.  Patient's last cocaine use was at least 3 to 4 weeks ago.   Plan is to monitor patient in the ED and get delta troponins.  If the troponin stay normal then I do not think patient needs to be admitted - it might be better to d/c with strict ER return precautions.  Final Clinical Impressions(s) / ED Diagnoses   Final diagnoses:  Precordial chest pain  Dyspnea, unspecified type    ED Discharge Orders    None       Varney Biles, MD 05/03/18 [phone]

## 2018-05-03 NOTE — Discharge Instructions (Addendum)

## 2018-05-04 ENCOUNTER — Observation Stay (HOSPITAL_COMMUNITY): Payer: Medicare Other

## 2018-05-04 ENCOUNTER — Observation Stay (HOSPITAL_BASED_OUTPATIENT_CLINIC_OR_DEPARTMENT_OTHER): Payer: Medicare Other

## 2018-05-04 DIAGNOSIS — I10 Essential (primary) hypertension: Secondary | ICD-10-CM | POA: Diagnosis not present

## 2018-05-04 DIAGNOSIS — Z951 Presence of aortocoronary bypass graft: Secondary | ICD-10-CM | POA: Diagnosis not present

## 2018-05-04 DIAGNOSIS — N179 Acute kidney failure, unspecified: Secondary | ICD-10-CM | POA: Diagnosis not present

## 2018-05-04 DIAGNOSIS — Z794 Long term (current) use of insulin: Secondary | ICD-10-CM

## 2018-05-04 DIAGNOSIS — R0600 Dyspnea, unspecified: Secondary | ICD-10-CM | POA: Diagnosis not present

## 2018-05-04 DIAGNOSIS — N183 Chronic kidney disease, stage 3 (moderate): Secondary | ICD-10-CM

## 2018-05-04 DIAGNOSIS — R079 Chest pain, unspecified: Secondary | ICD-10-CM

## 2018-05-04 DIAGNOSIS — K219 Gastro-esophageal reflux disease without esophagitis: Secondary | ICD-10-CM | POA: Diagnosis not present

## 2018-05-04 DIAGNOSIS — R0602 Shortness of breath: Secondary | ICD-10-CM | POA: Diagnosis not present

## 2018-05-04 DIAGNOSIS — E1129 Type 2 diabetes mellitus with other diabetic kidney complication: Secondary | ICD-10-CM | POA: Diagnosis present

## 2018-05-04 DIAGNOSIS — F1420 Cocaine dependence, uncomplicated: Secondary | ICD-10-CM

## 2018-05-04 DIAGNOSIS — R072 Precordial pain: Secondary | ICD-10-CM

## 2018-05-04 DIAGNOSIS — B20 Human immunodeficiency virus [HIV] disease: Secondary | ICD-10-CM

## 2018-05-04 DIAGNOSIS — E785 Hyperlipidemia, unspecified: Secondary | ICD-10-CM

## 2018-05-04 DIAGNOSIS — E1122 Type 2 diabetes mellitus with diabetic chronic kidney disease: Secondary | ICD-10-CM

## 2018-05-04 LAB — GLUCOSE, CAPILLARY
GLUCOSE-CAPILLARY: 154 mg/dL — AB (ref 70–99)
Glucose-Capillary: 252 mg/dL — ABNORMAL HIGH (ref 70–99)
Glucose-Capillary: 208 mg/dL — ABNORMAL HIGH (ref 70–99)
Glucose-Capillary: 177 mg/dL — ABNORMAL HIGH (ref 70–99)

## 2018-05-04 LAB — TROPONIN I
Troponin I: <0.03 ng/mL (ref <0.03)
Troponin I: <0.03 ng/mL (ref <0.03)
Troponin I: <0.03 ng/mL (ref <0.03)

## 2018-05-04 LAB — LIPID PANEL
CHOLESTEROL: 187 mg/dL (ref 0–200)
HDL: 44 mg/dL (ref >40)
LDL Cholesterol: 110 mg/dL — ABNORMAL HIGH (ref 0–99)
TRIGLYCERIDES: 166 mg/dL — AB (ref <150)
Total CHOL/HDL Ratio: 4.3 RATIO
VLDL: 33 mg/dL (ref 0–40)

## 2018-05-04 LAB — HEMOGLOBIN A1C
Hgb A1c MFr Bld: 9.7 % — ABNORMAL HIGH (ref 4.8–5.6)
MEAN PLASMA GLUCOSE: 231.69 mg/dL

## 2018-05-04 LAB — D-DIMER, QUANTITATIVE (NOT AT ARMC): D DIMER QUANT: 0.88 ug{FEU}/mL — AB (ref 0.00–0.50)

## 2018-05-04 LAB — ECHOCARDIOGRAM COMPLETE
Height: 68 in
WEIGHTICAEL: 2624 [oz_av]

## 2018-05-04 LAB — HEPARIN LEVEL (UNFRACTIONATED): Heparin Unfractionated: 1.18 IU/mL — ABNORMAL HIGH (ref 0.30–0.70)

## 2018-05-04 LAB — BRAIN NATRIURETIC PEPTIDE: B Natriuretic Peptide: 36.1 pg/mL (ref 0.0–100.0)

## 2018-05-04 MED ORDER — TRAZODONE HCL 50 MG PO TABS: 50.0000 mg | ORAL_TABLET | Freq: Every evening | ORAL | Status: DC | PRN

## 2018-05-04 MED ORDER — GABAPENTIN 400 MG PO CAPS: 400.0000 mg | ORAL_CAPSULE | Freq: Two times a day (BID) | ORAL | Status: DC | PRN

## 2018-05-04 MED ORDER — INSULIN ASPART 100 UNIT/ML ~~LOC~~ SOLN
5.0000 [IU] | Freq: Once | SUBCUTANEOUS | Status: AC
  Administered 2018-05-04: 5 [IU] via INTRAVENOUS
  Filled 2018-05-04: qty 1

## 2018-05-04 MED ORDER — ASPIRIN 81 MG PO CHEW
81.0000 mg | CHEWABLE_TABLET | Freq: Every day | ORAL | Status: DC
  Administered 2018-05-04 – 2018-05-05 (×2): 81 mg via ORAL
  Filled 2018-05-04 (×2): qty 1

## 2018-05-04 MED ORDER — HEPARIN SODIUM (PORCINE) 5000 UNIT/ML IJ SOLN: 5000.0000 [IU] | Freq: Three times a day (TID) | INTRAMUSCULAR | Status: DC

## 2018-05-04 MED ORDER — LIDOCAINE 5 % EX PTCH
2.0000 | MEDICATED_PATCH | CUTANEOUS | Status: DC
  Administered 2018-05-04: 2 via TRANSDERMAL
  Filled 2018-05-04: qty 2

## 2018-05-04 MED ORDER — HYDROMORPHONE HCL 1 MG/ML IJ SOLN
1.0000 mg | Freq: Once | INTRAMUSCULAR | Status: AC
  Administered 2018-05-04: 1 mg via INTRAVENOUS
  Filled 2018-05-04: qty 1

## 2018-05-04 MED ORDER — ISOSORBIDE MONONITRATE ER 60 MG PO TB24: 60.0000 mg | ORAL_TABLET | Freq: Every day | ORAL | Status: DC

## 2018-05-04 MED ORDER — INSULIN GLARGINE 100 UNIT/ML ~~LOC~~ SOLN
25.0000 [IU] | Freq: Every day | SUBCUTANEOUS | Status: DC
  Administered 2018-05-04: 25 [IU] via SUBCUTANEOUS
  Filled 2018-05-04 (×3): qty 0.25

## 2018-05-04 MED ORDER — ISOSORBIDE MONONITRATE ER 60 MG PO TB24
60.0000 mg | ORAL_TABLET | Freq: Every day | ORAL | Status: DC
  Administered 2018-05-04 – 2018-05-05 (×2): 60 mg via ORAL
  Filled 2018-05-04 (×2): qty 1

## 2018-05-04 MED ORDER — EMTRICITAB-RILPIVIR-TENOFOV AF 200-25-25 MG PO TABS
1.0000 | ORAL_TABLET | Freq: Every day | ORAL | Status: DC
  Administered 2018-05-04 – 2018-05-05 (×2): 1 via ORAL
  Filled 2018-05-04 (×2): qty 1

## 2018-05-04 MED ORDER — INSULIN ASPART 100 UNIT/ML ~~LOC~~ SOLN
0.0000 [IU] | Freq: Three times a day (TID) | SUBCUTANEOUS | Status: DC
  Administered 2018-05-04: 2 [IU] via SUBCUTANEOUS
  Administered 2018-05-04: 5 [IU] via SUBCUTANEOUS
  Administered 2018-05-04: 3 [IU] via SUBCUTANEOUS
  Administered 2018-05-05: 2 [IU] via SUBCUTANEOUS
  Administered 2018-05-05: 1 [IU] via SUBCUTANEOUS

## 2018-05-04 MED ORDER — ASPIRIN 81 MG PO CHEW: 324.0000 mg | CHEWABLE_TABLET | Freq: Once | ORAL | Status: DC

## 2018-05-04 MED ORDER — SODIUM CHLORIDE 0.9 % IV BOLUS
1000.0000 mL | Freq: Once | INTRAVENOUS | Status: AC
  Administered 2018-05-04: 1000 mL via INTRAVENOUS

## 2018-05-04 MED ORDER — INSULIN ASPART 100 UNIT/ML IV SOLN
5.0000 [IU] | Freq: Once | INTRAVENOUS | Status: DC
  Filled 2018-05-04: qty 0.05

## 2018-05-04 MED ORDER — HYDROXYZINE HCL 25 MG PO TABS: 25.0000 mg | ORAL_TABLET | Freq: Three times a day (TID) | ORAL | Status: DC | PRN

## 2018-05-04 MED ORDER — COLCHICINE 0.6 MG PO TABS
0.6000 mg | ORAL_TABLET | Freq: Every day | ORAL | Status: DC
  Administered 2018-05-04 – 2018-05-05 (×2): 0.6 mg via ORAL
  Filled 2018-05-04 (×2): qty 1

## 2018-05-04 MED ORDER — PERFLUTREN LIPID MICROSPHERE
1.0000 mL | INTRAVENOUS | Status: DC | PRN
  Administered 2018-05-04: 2 mL via INTRAVENOUS
  Filled 2018-05-04: qty 10

## 2018-05-04 MED ORDER — ALBUTEROL SULFATE (2.5 MG/3ML) 0.083% IN NEBU: 2.5000 mg | INHALATION_SOLUTION | RESPIRATORY_TRACT | Status: DC | PRN

## 2018-05-04 MED ORDER — HEPARIN BOLUS VIA INFUSION
4500.0000 [IU] | Freq: Once | INTRAVENOUS | Status: AC
  Administered 2018-05-04: 4500 [IU] via INTRAVENOUS
  Filled 2018-05-04: qty 4500

## 2018-05-04 MED ORDER — HYDRALAZINE HCL 20 MG/ML IJ SOLN
5.0000 mg | INTRAMUSCULAR | Status: DC | PRN
Start: 2018-05-04 — End: 2018-05-05

## 2018-05-04 MED ORDER — ISOSORBIDE MONONITRATE ER 30 MG PO TB24: 60.0000 mg | ORAL_TABLET | Freq: Every day | ORAL | Status: DC

## 2018-05-04 MED ORDER — ASPIRIN 81 MG PO CHEW
324.0000 mg | CHEWABLE_TABLET | Freq: Once | ORAL | Status: AC
  Administered 2018-05-04: 324 mg via ORAL

## 2018-05-04 MED ORDER — ALLOPURINOL 300 MG PO TABS: 300.0000 mg | ORAL_TABLET | Freq: Every day | ORAL | Status: DC

## 2018-05-04 MED ORDER — SERTRALINE HCL 50 MG PO TABS
50.0000 mg | ORAL_TABLET | Freq: Every day | ORAL | Status: DC
  Administered 2018-05-04 – 2018-05-05 (×2): 50 mg via ORAL
  Filled 2018-05-04 (×2): qty 1

## 2018-05-04 MED ORDER — ALPRAZOLAM 0.25 MG PO TABS: 0.2500 mg | ORAL_TABLET | Freq: Two times a day (BID) | ORAL | Status: DC | PRN

## 2018-05-04 MED ORDER — FAMOTIDINE 20 MG PO TABS
20.0000 mg | ORAL_TABLET | Freq: Every day | ORAL | Status: DC
  Administered 2018-05-04 – 2018-05-05 (×2): 20 mg via ORAL
  Filled 2018-05-04 (×2): qty 1

## 2018-05-04 MED ORDER — PRAVASTATIN SODIUM 40 MG PO TABS
40.0000 mg | ORAL_TABLET | Freq: Every day | ORAL | Status: DC
  Administered 2018-05-04: 40 mg via ORAL
  Filled 2018-05-04: qty 1

## 2018-05-04 MED ORDER — AMANTADINE HCL 100 MG PO CAPS
100.0000 mg | ORAL_CAPSULE | Freq: Every day | ORAL | Status: DC
  Administered 2018-05-04 – 2018-05-05 (×2): 100 mg via ORAL
  Filled 2018-05-04 (×3): qty 1

## 2018-05-04 MED ORDER — NITROGLYCERIN IN D5W 200-5 MCG/ML-% IV SOLN
0.0000 ug/min | INTRAVENOUS | Status: DC
  Administered 2018-05-04: 5 ug/min via INTRAVENOUS
  Filled 2018-05-04: qty 250

## 2018-05-04 MED ORDER — SODIUM CHLORIDE 0.9 % IV SOLN
INTRAVENOUS | Status: DC
  Administered 2018-05-04 (×2): via INTRAVENOUS

## 2018-05-04 MED ORDER — TECHNETIUM TO 99M ALBUMIN AGGREGATED
4.0000 | Freq: Once | INTRAVENOUS | Status: AC | PRN
  Administered 2018-05-04: 4 via INTRAVENOUS

## 2018-05-04 MED ORDER — HEPARIN (PORCINE) IN NACL 100-0.45 UNIT/ML-% IJ SOLN
1350.0000 [IU]/h | INTRAMUSCULAR | Status: DC
  Administered 2018-05-04: 1350 [IU]/h via INTRAVENOUS
  Filled 2018-05-04: qty 250

## 2018-05-04 MED ORDER — HEPARIN (PORCINE) IN NACL 100-0.45 UNIT/ML-% IJ SOLN
1200.0000 [IU]/h | INTRAMUSCULAR | Status: DC
  Administered 2018-05-04 (×2): 1200 [IU]/h via INTRAVENOUS
  Filled 2018-05-04: qty 250

## 2018-05-04 MED ORDER — DILTIAZEM HCL ER COATED BEADS 120 MG PO CP24
120.0000 mg | ORAL_CAPSULE | Freq: Every day | ORAL | Status: DC
  Administered 2018-05-04 – 2018-05-05 (×2): 120 mg via ORAL
  Filled 2018-05-04 (×2): qty 1

## 2018-05-04 MED ORDER — NITROGLYCERIN 0.4 MG SL SUBL
0.4000 mg | SUBLINGUAL_TABLET | SUBLINGUAL | Status: DC | PRN
  Administered 2018-05-04 (×3): 0.4 mg via SUBLINGUAL
  Filled 2018-05-04: qty 1

## 2018-05-04 MED ORDER — TECHNETIUM TC 99M DIETHYLENETRIAME-PENTAACETIC ACID
30.0000 | Freq: Once | INTRAVENOUS | Status: AC | PRN
  Administered 2018-05-04: 30 via RESPIRATORY_TRACT

## 2018-05-04 MED ORDER — RISPERIDONE 0.5 MG PO TABS
0.5000 mg | ORAL_TABLET | Freq: Every day | ORAL | Status: DC
  Administered 2018-05-04 – 2018-05-05 (×2): 0.5 mg via ORAL
  Filled 2018-05-04 (×3): qty 1

## 2018-05-04 MED ORDER — ACETAMINOPHEN 325 MG PO TABS: 650.0000 mg | ORAL_TABLET | ORAL | Status: DC | PRN

## 2018-05-04 MED ORDER — MORPHINE SULFATE (PF) 4 MG/ML IV SOLN
2.0000 mg | INTRAVENOUS | Status: DC | PRN
Start: 2018-05-04 — End: 2018-05-05
  Administered 2018-05-04 (×2): 2 mg via INTRAVENOUS
  Filled 2018-05-04 (×2): qty 1

## 2018-05-04 MED ORDER — ASPIRIN 81 MG PO CHEW
CHEWABLE_TABLET | ORAL | Status: AC
  Filled 2018-05-04: qty 4

## 2018-05-04 MED ORDER — ZOLPIDEM TARTRATE 5 MG PO TABS: 5.0000 mg | ORAL_TABLET | Freq: Every evening | ORAL | Status: DC | PRN

## 2018-05-04 MED ORDER — ONDANSETRON HCL 4 MG/2ML IJ SOLN
4.0000 mg | Freq: Four times a day (QID) | INTRAMUSCULAR | Status: DC | PRN
  Administered 2018-05-04: 4 mg via INTRAVENOUS
  Filled 2018-05-04: qty 2

## 2018-05-04 NOTE — Progress Notes (Signed)
[place] TEAM 1 - Stepdown/ICU TEAM  Michael [name]  MEQ:[id number] DOB: 11-11-1950 DOA: 05/03/2018 PCP: [name], PA-C    Brief Narrative:  [phone] M w/ a hx of HTN, HLD, DM2, asthma, GERD, gout, depression, atrial fibrillation not on anticoagulants, CAD s/p CABG, collagen vascular disease, crack cocaine abuse, HIV (CD4=630 and VL<20 on 10/14/17), PE, and CKD 3 who presented with intermittent chest pain and shortness of breath of many months duration.  In the ED he was found to have a negative troponin, positive d-dimer 0.88, worsening renal function, oxygen saturation 100% on room air, and a negative chest x-ray.    Subjective: Pt is seen for a f/u visit.    Assessment & Plan:  Chest pain with history of CAD status post CABG Pain is not consistent with ischemic coronary artery origin - Cardiology following  Elevated d-dimer VQ pending   HIV  Cocaine abuse  Acute kidney injury on chronic kidney disease stage III Baseline creatinine 1.5  Gout  HTN  DM 2 A1c 9.7   GERD  DVT prophylaxis: IV heparin  Code Status: FULL CODE Family Communication: no family present at time of exam  Disposition Plan:   Consultants:  Cardiology   Antimicrobials:  none   Objective: Blood pressure 132/68, pulse 90, temperature 97.8 F (36.6 C), temperature source Oral, resp. rate 10, height 5\' 8"  (1.727 m), weight 74.4 kg (164 lb), SpO2 95 %.  Intake/Output Summary (Last 24 hours) at 05/04/2018 1643 Last data filed at 05/04/2018 1500 Gross per 24 hour  Intake 1145.02 ml  Output 0 ml  Net 1145.02 ml   Filed Weights   05/03/18 2302  Weight: 74.4 kg (164 lb)    Examination: Pt was seen for a f/u visit.    CBC: Recent Labs  Lab 05/03/18 0053 05/03/18 2309  WBC 5.6 4.1  HGB 11.6* 10.5*  HCT 37.0* 33.1*  MCV 84.3 83.4  PLT 228 892   Basic Metabolic Panel: Recent Labs  Lab 05/03/18 0053 05/03/18 2309  NA 136 134*  K 4.3 4.2  CL 101 101  CO2 23 23    GLUCOSE 301* 506*  BUN 25* 28*  CREATININE 1.94* 2.00*  CALCIUM 9.1 9.0   GFR: Estimated Creatinine Clearance: 34.2 mL/min (A) (by C-G formula based on SCr of 2 mg/dL (H)).  Liver Function Tests: No results for input(s): AST, ALT, ALKPHOS, BILITOT, PROT, ALBUMIN in the last 168 hours. No results for input(s): LIPASE, AMYLASE in the last 168 hours. No results for input(s): AMMONIA in the last 168 hours.  Coagulation Profile: No results for input(s): INR, PROTIME in the last 168 hours.  Cardiac Enzymes: Recent Labs  Lab 05/04/18 0237 05/04/18 0737 05/04/18 1357  TROPONINI <0.03 <0.03 <0.03    HbA1C: Hemoglobin A1C  Date/Time Value Ref Range Status  12/26/2016 11:11 AM 10.6  Final  04/17/2016 03:10 PM 13.3  Final   Hgb A1c MFr Bld  Date/Time Value Ref Range Status  05/04/2018 02:37 AM 9.7 (H) 4.8 - 5.6 % Final    Comment:    (NOTE) Pre diabetes:          5.7%-6.4% Diabetes:              >6.4% Glycemic control for   <7.0% adults with diabetes   12/15/2014 06:50 AM 8.2 (H) 4.8 - 5.6 % Final    Comment:    (NOTE)         Pre-diabetes: 5.7 - 6.4  Diabetes: >6.4         Glycemic control for adults with diabetes: <7.0     CBG: Recent Labs  Lab 05/04/18 0654 05/04/18 1150  GLUCAP 252* 208*     Scheduled Meds: . amantadine  100 mg Oral Daily  . aspirin  324 mg Oral Once  . colchicine  0.6 mg Oral Daily  . diltiazem  120 mg Oral Daily  . emtricitabine-rilpivir-tenofovir AF  1 tablet Oral Q breakfast  . famotidine  20 mg Oral Daily  . insulin aspart  0-9 Units Subcutaneous TID WC  . insulin glargine  25 Units Subcutaneous QHS  . isosorbide mononitrate  60 mg Oral Daily  . lidocaine  2 patch Transdermal Q24H  . pravastatin  40 mg Oral QHS  . risperiDONE  0.5 mg Oral Daily  . sertraline  50 mg Oral Daily   Continuous Infusions: . sodium chloride 75 mL/hr at 05/04/18 1500  . heparin 1,200 Units/hr (05/04/18 1408)     LOS: 0 days   Time spent:  No Charge  Cherene Altes, MD [facility] Office  [phone] Pager - Text Page per Amion as per below:  On-Call/Text Page:      [email or web address]      password [email or web address]  If 7PM-7AM, please contact night-coverage [email or web address] Password [facility] 05/04/2018, 4:43 PM

## 2018-05-04 NOTE — Progress Notes (Signed)
  Echocardiogram 2D Echocardiogram has been performed.  Jennette Dubin 05/04/2018, 10:10 AM

## 2018-05-04 NOTE — ED Provider Notes (Signed)
[facility] EMERGENCY DEPARTMENT Provider Note   CSN: [id number] Arrival date & time: 05/03/18  2253     History   Chief Complaint Chief Complaint  Patient presents with  . Chest Pain    HPI Constant Mandeville is a 67 y.o. male.  HPI   Pt 67 yo, non smoker, PMH CAD s/p PCI, type 2 DM,hep C, HIV (last viral load undetectable, CD4 count 600 10/2017) comes in with worsening chest pain with exertion.  Patient had PCI placement and CABG done in April of this year at outside hospital.  Patient reports that he has had persistent chest discomfort since that time.  He was seen yesterday by me, when he was discharged after troponins were negative because his chest pain is resolved and EKG had no acute findings.  Patient states that today he is having chest pain with exertion, along with shortness of breath.  Patient also has mild chest discomfort at the moment.   Patient has not followed up with cardiology since his procedures.  He has been going to cardiac rehab it appears.  It also seems that patient is new to [place], and at the moment he is homeless.  Past Medical History:  Diagnosis Date  . A-fib ([place])   . Asthma    No PFTs, history of childhood asthma  . CAD (coronary artery disease)    a. 06/2013 STEMI/PCI ([facility]): LAD w/ thrombus (treated with BMS), mid 75%, D2 75%; LCX OM2 75%; RCA small, PDA 95%, PLV 95%;  b. 10/2013 Cath/PCI: ISR w/in LAD (Promus DES x 2), borderline OM2 lesion;  c. 01/2014 MV: Intermediate risk, medium-sized distal ant wall infarct w/ very small amt of peri-infarct ischemia. EF 60%.  . Cellulitis 04/2014   left facial  . Chondromalacia of medial femoral condyle    Left knee MRI 04/28/12: Chondromalacia of the medial femoral condyle with slight peripheral degeneration of the meniscocapsular junction of the medial meniscus; followed by sports medicine  . Collagen vascular disease ([place])   . Crack cocaine use    for 20+ years, has been enrolled in  detox programs in the past  . Depression    with history of hospitalization for suicidal ideation  . Diabetes mellitus 2002   Diagnosed in 2002, started insulin in 2012  . Gout   . Gout 04/28/2012  . Headache(784.0)    CT head 08/2011: Periventricular and subcortical white matter hypodensities are most in keeping with chronic microangiopathic change  . HIV infection [facility]) Nov 2012   Followed by [name]  . Hyperlipidemia   . Hypertension   . Pulmonary embolism [facility])     Patient Active Problem List   Diagnosis Date Noted  . Type II diabetes mellitus with renal manifestations ([place]) 05/04/2018  . GERD (gastroesophageal reflux disease) 05/04/2018  . Left testicular pain   . Depression 02/20/2018  . Epididymo-orchitis, acute 02/20/2018  . Essential hypertension 02/20/2018  . Chest pain 02/20/2018  . AKI (acute kidney injury) ([place]) 02/14/2018  . HCAP (healthcare-associated pneumonia) 02/14/2018  . Hepatitis C 12/30/2016  . Back pain 04/18/2016  . S/P carotid endarterectomy 11/15/2015  . MDD (major depressive disorder), recurrent severe, without psychosis ([place]) 09/09/2015  . Cocaine-induced mood disorder ([place]) 08/14/2015  . Cocaine abuse with cocaine-induced mood disorder ([place]) 08/14/2015  . Gout 07/10/2015  . Acute renal failure superimposed on stage 3 chronic kidney disease ([place]) 03/06/2015  . Normocytic anemia 03/06/2015  . Hypoglycemia   . Encounter for general adult  medical examination with abnormal findings 02/09/2015  . Cocaine use disorder, severe, dependence ([place]) 12/13/2014  . Substance or medication-induced depressive disorder with onset during withdrawal ([place]) 12/13/2014  . Severe recurrent major depressive disorder with psychotic features ([place]) 12/12/2014  . Cervicalgia 06/28/2014  . Lumbar radiculopathy, chronic 06/28/2014  . Asthma, chronic 02/03/2014  . 3-vessel CAD 06/24/2013  . ED (erectile dysfunction) of organic origin 07/07/2012  . Hypertension goal BP  (blood pressure) < 140/80 04/29/2012  . Chondromalacia of left knee 03/19/2012  . Hyperlipidemia with target LDL less than 100 02/12/2012  . Fibromyalgia 02/12/2012  . HIV (human immunodeficiency virus infection) ([place]) 08/27/2011  . Uncontrolled type 2 diabetes with neuropathy ([place]) 10/17/2000    Past Surgical History:  Procedure Laterality Date  . BACK SURGERY     1988  . BOWEL RESECTION    . CARDIAC SURGERY    . CERVICAL SPINE SURGERY     " rods in my neck "  . CORONARY ARTERY BYPASS GRAFT    . CORONARY STENT PLACEMENT    . NM MYOCAR PERF WALL MOTION  12/27/2011   normal  . SPINE SURGERY          Home Medications    Prior to Admission medications   Medication Sig Start Date End Date Taking? Authorizing Provider  allopurinol (ZYLOPRIM) 300 MG tablet Take 1 tablet (300 mg total) by mouth daily. For gout. 11/25/17  Yes Lindell Spar I, NP  amantadine (SYMMETREL) 100 MG capsule Take 100 mg by mouth daily. 02/14/18  Yes [provider]  aspirin 81 MG chewable tablet Chew 81 mg by mouth daily.   Yes [provider]  colchicine 0.6 MG tablet Take 1 tablet (0.6 mg total) by mouth daily. 02/27/18  Yes Merlene Laughter F, NP  diltiazem (CARDIZEM CD) 120 MG 24 hr capsule Take 1 capsule (120 mg total) by mouth daily. For high blood pressure 11/25/17  Yes Lindell Spar I, NP  emtricitabine-rilpivir-tenofovir AF (ODEFSEY) 200-25-25 MG TABS tablet Take 1 tablet by mouth daily with breakfast. For HIV infection 11/24/17  Yes Nwoko, Herbert Pun I, NP  famotidine (PEPCID) 20 MG tablet Take 1 tablet (20 mg total) by mouth daily. For acid reflux 11/24/17  Yes Lindell Spar I, NP  gabapentin (NEURONTIN) 400 MG capsule Take 1 capsule (400 mg total) by mouth 2 (two) times daily. For agitation/neuropathic pain Patient taking differently: Take 400 mg by mouth 2 (two) times daily as needed (For agitation/neuropathic pain).  11/24/17  Yes Lindell Spar I, NP  hydrOXYzine (ATARAX/VISTARIL) 25 MG tablet  Take 1 tablet (25 mg total) by mouth 3 (three) times daily as needed for anxiety. 11/24/17  Yes Lindell Spar I, NP  insulin glargine (LANTUS) 100 UNIT/ML injection Inject 0.3 mLs (30 Units total) into the skin at bedtime. For diabetes management 11/24/17  Yes Lindell Spar I, NP  insulin lispro (HUMALOG) 100 UNIT/ML injection Inject 0.05 mLs (5 Units total) into the skin 3 (three) times daily with meals. For diabetes management Patient taking differently: Inject 4 Units into the skin 3 (three) times daily with meals. For diabetes management 11/24/17  Yes Lindell Spar I, NP  isosorbide mononitrate (IMDUR) 30 MG 24 hr tablet Take 2 tablets (60 mg total) by mouth daily. For chest 02/26/18  Yes Merlene Laughter F, NP  metFORMIN (GLUCOPHAGE) 1000 MG tablet Take 1 tablet (1,000 mg total) by mouth 2 (two) times daily with a meal. For diabetes management 11/24/17  Yes Encarnacion Slates, NP  pravastatin (PRAVACHOL) 40 MG tablet Take 1 tablet (40 mg total) by mouth at bedtime. For high cholesterol 11/24/17  Yes Nwoko, Herbert Pun I, NP  PROAIR HFA 108 (90 Base) MCG/ACT inhaler INHALE TWO PUFFS INTO THE LUNGS EVERY 6 (SIX) HOURS AS NEEDED FOR WHEEZING OR SHORTNESS OF BREATH. 02/24/18  Yes [name], MD  risperiDONE (RISPERDAL) 0.5 MG tablet Take 0.5 mg by mouth daily. 06/16/17  Yes [provider]  sertraline (ZOLOFT) 50 MG tablet Take 1 tablet (50 mg total) by mouth daily. For depression 11/25/17  Yes Lindell Spar I, NP  traZODone (DESYREL) 50 MG tablet Take 1 tablet (50 mg total) by mouth at bedtime as needed for sleep. 11/24/17  Yes Nwoko, Herbert Pun I, NP  ACCU-CHEK SOFTCLIX LANCETS lancets USE TO CHECK BLOOD GLUCOSE LEVELS UP TO 4 TIMES DAILY AS DIRECTED 11/24/17   Lindell Spar I, NP  albuterol (PROAIR HFA) 108 (90 Base) MCG/ACT inhaler INHALE TWO PUFFS INTO THE LUNGS EVERY 6 (SIX) HOURS AS NEEDED FOR WHEEZING OR SHORTNESS OF BREATH. Patient not taking: Reported on 05/03/2018 11/24/17   Lindell Spar I, NP  Insulin  Syringe-Needle U-100 (B-D INS SYR MICROFINE 1CC/27G) 27G X 5/8" 1 ML MISC 1 strip by Does not apply route 3 (three) times daily as needed. For blood sugar checks 11/24/17   Encarnacion Slates, NP    Family History Family History  Problem Relation Age of Onset  . Diabetes Mother   . Hypertension Mother   . Hyperlipidemia Mother   . Diabetes Father   . Cancer Father   . Hypertension Father   . Diabetes Brother   . Heart disease Brother   . Diabetes Sister   . Colon cancer Neg Hx     Social History Social History   Tobacco Use  . Smoking status: Never Smoker  . Smokeless tobacco: Never Used  Substance Use Topics  . Alcohol use: No    Alcohol/week: 2.4 oz    Types: 2 Cans of beer, 2 Shots of liquor per week  . Drug use: Yes    Types: "Crack" cocaine, Cocaine    Comment: last use January 2018     Allergies   Patient has no known allergies.   Review of Systems Review of Systems  Constitutional: Positive for activity change.  Respiratory: Positive for shortness of breath.   Cardiovascular: Positive for chest pain.  Allergic/Immunologic: Positive for immunocompromised state.  Hematological: Bruises/bleeds easily.     Physical Exam Updated Vital Signs BP (!) 141/88   Pulse 93   Temp 98.4 F (36.9 C) (Oral)   Resp 11   Ht 5\' 8"  (1.727 m)   Wt 74.4 kg (164 lb)   SpO2 100%   BMI 24.94 kg/m   Physical Exam  Constitutional: He is oriented to person, place, and time. He appears well-developed.  HENT:  Head: Atraumatic.  Neck: Neck supple.  Cardiovascular: Normal rate, intact distal pulses and normal pulses.  Pulmonary/Chest: Effort normal.  Musculoskeletal:       Right lower leg: He exhibits no edema.       Left lower leg: He exhibits no edema.  Neurological: He is alert and oriented to person, place, and time.  Skin: Skin is warm.  Nursing note and vitals reviewed.    ED Treatments / Results  Labs (all labs ordered are listed, but only abnormal results are  displayed) Labs Reviewed  BASIC METABOLIC PANEL - Abnormal; Notable for the following components:  Result Value   Sodium 134 (*)    Glucose, Bld 506 (*)    BUN 28 (*)    Creatinine, Ser 2.00 (*)    GFR calc non Af Amer 33 (*)    GFR calc Af Amer 38 (*)    All other components within normal limits  CBC - Abnormal; Notable for the following components:   RBC 3.97 (*)    Hemoglobin 10.5 (*)    HCT 33.1 (*)    All other components within normal limits  D-DIMER, QUANTITATIVE (NOT AT [facility]) - Abnormal; Notable for the following components:   D-Dimer, Quant 0.88 (*)    All other components within normal limits  HEMOGLOBIN A1C - Abnormal; Notable for the following components:   Hgb A1c MFr Bld 9.7 (*)    All other components within normal limits  LIPID PANEL - Abnormal; Notable for the following components:   Triglycerides 166 (*)    LDL Cholesterol 110 (*)    All other components within normal limits  BRAIN NATRIURETIC PEPTIDE  TROPONIN I  TROPONIN I  TROPONIN I  RAPID URINE DRUG SCREEN, HOSP PERFORMED  CREATININE, URINE, RANDOM  SODIUM, URINE, RANDOM  HEPARIN LEVEL (UNFRACTIONATED)  I-STAT TROPONIN, ED    EKG EKG Interpretation  Date/Time:  Monday May 04 2018 02:05:34 EDT Ventricular Rate:  90 PR Interval:    QRS Duration: 84 QT Interval:  369 QTC Calculation: 452 R Axis:   78 Text Interpretation:  Sinus arrhythmia Borderline T wave abnormalities When compared with ECG of 05/03/2018, No significant change was found Confirmed by Delora Fuel ([id number]) on 05/04/2018 4:01:12 AM   Radiology Dg Chest 2 View  Result Date: 05/03/2018 CLINICAL DATA:  Chest pain EXAM: CHEST - 2 VIEW COMPARISON:  05/03/2018 FINDINGS: Lungs are clear.  No pleural effusion or pneumothorax. The heart is normal in size. Postsurgical changes related to prior CABG. Degenerative changes of the thoracic spine. Cervical spine fixation hardware. Median sternotomy. IMPRESSION: No evidence of acute  cardiopulmonary disease. Electronically Signed   By: Julian Hy M.D.   On: 05/03/2018 23:32   Dg Chest 2 View  Result Date: 05/03/2018 CLINICAL DATA:  Chest pain EXAM: CHEST - 2 VIEW COMPARISON:  04/17/2018, CT chest 04/11/2018 FINDINGS: Postsurgical changes of the cervical spine. Post sternotomy. Mild elevation of right diaphragm. No acute airspace disease or effusion. Residual opacity in the medial right base. Stable cardiomediastinal silhouette. No pneumothorax. Degenerative changes of the spine. IMPRESSION: No acute interval change since 04/17/2018. Persistent mild opacity in the medial right base, may reflect slowly resolving pneumonia. Electronically Signed   By: Donavan Foil M.D.   On: 05/03/2018 01:28    Procedures Procedures (including critical care time)  Medications Ordered in ED Medications  nitroGLYCERIN (NITROSTAT) SL tablet 0.4 mg (0.4 mg Sublingual Given 05/04/18 0225)  aspirin chewable tablet 324 mg (has no administration in time range)  amantadine (SYMMETREL) capsule 100 mg (has no administration in time range)  colchicine tablet 0.6 mg (has no administration in time range)  diltiazem (CARDIZEM CD) 24 hr capsule 120 mg (has no administration in time range)  emtricitabine-rilpivir-tenofovir AF (ODEFSEY) 200-25-25 MG per tablet 1 tablet (has no administration in time range)  famotidine (PEPCID) tablet 20 mg (has no administration in time range)  gabapentin (NEURONTIN) capsule 400 mg (has no administration in time range)  hydrOXYzine (ATARAX/VISTARIL) tablet 25 mg (has no administration in time range)  insulin glargine (LANTUS) injection 25 Units (has no administration in time range)  pravastatin (PRAVACHOL) tablet 40 mg (has no administration in time range)  risperiDONE (RISPERDAL) tablet 0.5 mg (has no administration in time range)  sertraline (ZOLOFT) tablet 50 mg (has no administration in time range)  traZODone (DESYREL) tablet 50 mg (has no administration in time  range)  morphine 4 MG/ML injection 2 mg (2 mg Intravenous Given 05/04/18 0412)  albuterol (PROVENTIL) (2.5 MG/3ML) 0.083% nebulizer solution 2.5 mg (has no administration in time range)  0.9 %  sodium chloride infusion (has no administration in time range)  insulin aspart (novoLOG) injection 0-9 Units (has no administration in time range)  acetaminophen (TYLENOL) tablet 650 mg (has no administration in time range)  ondansetron (ZOFRAN) injection 4 mg (has no administration in time range)  ALPRAZolam (XANAX) tablet 0.25 mg (has no administration in time range)  zolpidem (AMBIEN) tablet 5 mg (has no administration in time range)  hydrALAZINE (APRESOLINE) injection 5 mg (has no administration in time range)  heparin bolus via infusion 4,500 Units (has no administration in time range)  heparin ADULT infusion 100 units/mL (25000 units/274mL sodium chloride 0.45%) (has no administration in time range)  nitroGLYCERIN 50 mg in dextrose 5 % 250 mL (0.2 mg/mL) infusion (has no administration in time range)  sodium chloride 0.9 % bolus 1,000 mL (0 mLs Intravenous Stopped 05/04/18 0448)  aspirin chewable tablet 324 mg ( Oral Not Given 05/04/18 0253)  insulin aspart (novoLOG) injection 5 Units (5 Units Intravenous Given 05/04/18 0217)  HYDROmorphone (DILAUDID) injection 1 mg (1 mg Intravenous Given 05/04/18 0400)     Initial Impression / Assessment and Plan / ED Course  I have reviewed the triage vital signs and the nursing notes.  Pertinent labs & imaging results that were available during my care of the patient were reviewed by me and considered in my medical decision making (see chart for details).     67 year old male comes in a chief complaint of chest pain and shortness of breath.  Patient has history of CAD, PE, A. fib.  He recently had PCI and CABG done for similar symptoms.  Patient has been having worsening chest pain it seems like, and now he has pain with exertion which is new compared to  yesterday.  EKG is no acute findings.  However given change in symptomology in someone with high risk, we will admit him for ACS rule out.  Final Clinical Impressions(s) / ED Diagnoses   Final diagnoses:  Precordial chest pain    ED Discharge Orders    None       Varney Biles, MD 05/04/18 [phone]

## 2018-05-04 NOTE — Progress Notes (Signed)
ANTICOAGULATION CONSULT NOTE - Initial Consult  Pharmacy Consult for heparin Indication: pulmonary embolus  No Known Allergies  Patient Measurements: Height: 5\' 8"  (172.7 cm) Weight: 164 lb (74.4 kg) IBW/kg (Calculated) : 68.4 Heparin Dosing Weight: 77.4 kg  Vital Signs: Temp: 98.4 F (36.9 C) (07/28 2300) Temp Source: Oral (07/28 2300) BP: 146/95 (07/29 0254) Pulse Rate: 95 (07/29 0254)  Labs: Recent Labs    05/03/18 0053 05/03/18 2309 05/04/18 0237  HGB 11.6* 10.5*  --   HCT 37.0* 33.1*  --   PLT 228 205  --   CREATININE 1.94* 2.00*  --   TROPONINI  --   --  <0.03    Estimated Creatinine Clearance: 34.2 mL/min (A) (by C-G formula based on SCr of 2 mg/dL (H)).   Medical History: Past Medical History:  Diagnosis Date  . A-fib ([place])   . Asthma    No PFTs, history of childhood asthma  . CAD (coronary artery disease)    a. 06/2013 STEMI/PCI ([facility]): LAD w/ thrombus (treated with BMS), mid 75%, D2 75%; LCX OM2 75%; RCA small, PDA 95%, PLV 95%;  b. 10/2013 Cath/PCI: ISR w/in LAD (Promus DES x 2), borderline OM2 lesion;  c. 01/2014 MV: Intermediate risk, medium-sized distal ant wall infarct w/ very small amt of peri-infarct ischemia. EF 60%.  . Cellulitis 04/2014   left facial  . Chondromalacia of medial femoral condyle    Left knee MRI 04/28/12: Chondromalacia of the medial femoral condyle with slight peripheral degeneration of the meniscocapsular junction of the medial meniscus; followed by sports medicine  . Collagen vascular disease ([place])   . Crack cocaine use    for 20+ years, has been enrolled in detox programs in the past  . Depression    with history of hospitalization for suicidal ideation  . Diabetes mellitus 2002   Diagnosed in 2002, started insulin in 2012  . Gout   . Gout 04/28/2012  . Headache(784.0)    CT head 08/2011: Periventricular and subcortical white matter hypodensities are most in keeping with chronic microangiopathic change  . HIV infection [facility])  Nov 2012   Followed by [name]  . Hyperlipidemia   . Hypertension   . Pulmonary embolism ([name])     Medications:  See medication history  Assessment: 67 yo man to start heparin for CP, r/o PE.  He has OHS in April.  He was not on anticoagulation PTA.  Baseline Hg 10.5, no bleeding reported Goal of Therapy:  Heparin level 0.3-0.7 units/ml Monitor platelets by anticoagulation protocol: Yes   Plan:  Heparin 4500 unit bolus and drip at 1350 units/hr Check heparin level 6 hr after start Daily HL and CBC while on heparin Monitor for bleeding complications  Kaylina Cahue P[place]et 05/04/2018,3:53 AM

## 2018-05-04 NOTE — Progress Notes (Signed)
PT c/o nausea sudeen onset. Vomited x1 clear liquid. 4 mg Zofran given iv. Jerald Kief, RN

## 2018-05-04 NOTE — Progress Notes (Signed)
ANTICOAGULATION CONSULT NOTE Pharmacy Consult for heparin Indication: pulmonary embolus  No Known Allergies  Patient Measurements: Height: 5\' 8"  (172.7 cm) Weight: 164 lb (74.4 kg) IBW/kg (Calculated) : 68.4 Heparin Dosing Weight: 77.4 kg  Vital Signs: Temp: 97.8 F (36.6 C) (07/29 0656) Temp Source: Oral (07/29 0656) BP: 132/68 (07/29 1007) Pulse Rate: 90 (07/29 0656)  Labs: Recent Labs    05/03/18 0053 05/03/18 2309 05/04/18 0237 05/04/18 0737 05/04/18 1033  HGB 11.6* 10.5*  --   --   --   HCT 37.0* 33.1*  --   --   --   PLT 228 205  --   --   --   HEPARINUNFRC  --   --   --   --  1.18*  CREATININE 1.94* 2.00*  --   --   --   TROPONINI  --   --  <0.03 <0.03  --     Estimated Creatinine Clearance: 34.2 mL/min (A) (by C-G formula based on SCr of 2 mg/dL (H)).  Assessment: 67 yo man to start heparin for CP, r/o PE.  He has OHS in April.  He was not on anticoagulation PTA.  Baseline Hg 10.5, no bleeding reported  Initial heparin level elevated at 1.18  Goal of Therapy:  Heparin level 0.3-0.7 units/ml Monitor platelets by anticoagulation protocol: Yes   Plan:  Hold heparin x 1 hour Decrease heparin to 1200 units / hr Heparin level 6 hours after heparin restarts  Thank you Anette Guarneri, PharmD [phone] 05/04/2018,12:46 PM

## 2018-05-04 NOTE — Progress Notes (Signed)
Inpatient Diabetes Program Recommendations  AACE/ADA: New Consensus Statement on Inpatient Glycemic Control (2019)  Target Ranges:  Prepandial:   less than 140 mg/dL      Peak postprandial:   less than 180 mg/dL (1-2 hours)      Critically ill patients:  140 - 180 mg/dL   Results for Michael [name], Michael [name] (MRN [id number]) as of 05/04/2018 07:27  Ref. Range 05/04/2018 06:54  Glucose-Capillary Latest Ref Range: 70 - 99 mg/dL 252 (H)  Results for RIDGELY, ANASTACIO [facility] (MRN [id number]) as of 05/04/2018 07:27  Ref. Range 05/03/2018 23:09 05/04/2018 02:37  Glucose Latest Ref Range: 70 - 99 mg/dL 506 (HH)   Hemoglobin A1C Latest Ref Range: 4.8 - 5.6 %  9.7 (H)   Review of Glycemic Control  Outpatient Diabetes medications: Lantus 30 units QHS, Humalog 4 units TID with meals, Metformin 1000 mg BID Current orders for Inpatient glycemic control: Lantus 25 units QHS, Novolog 0-9 units TID with meals  Inpatient Diabetes Program Recommendations: Insulin - Basal: Noted Lantus 25 units QHS is ordered to start tonight. May want to change frequency to daily so that patient will receive this morning. Correction (SSI): Please consider adding Novolog 0-5 units QHS for bedtime correction scale. Insulin - Meal Coverage: Once diet is ordered please consider ordeirng Novolog 3 units TID with meals for meal coverage if patient eats at least 50% of meals. HgbA1C: A1C 9.7% on 05/04/18 indicating an average glucose of 232 mg/dl over the past 2-3 months. Inpatient Diabetes Coordinator spoke with patient on 02/24/18 during prior admission.  Thanks, Barnie Alderman, RN, MSN, CDE Diabetes Coordinator Inpatient Diabetes Program [phone] (Team Pager from 8am to 5pm)

## 2018-05-04 NOTE — Care Management Note (Signed)
Case Management Note  Patient Details  Name: Michael [name] MRN: [id number] Date of Birth: 1951/08/21  Subjective/Objective:     Chest Pain             Action/Plan: Noted that patient has been admitted 4 times in 6 months; recent SNF visit 02/27/2018; Primary Care Provider: Tawny Asal; has private insurance with [facility] [employer] with prescription drug coverage; CM will continue to follow for progression of care.  Expected Discharge Date:    possibly 05/08/2018              Expected Discharge Plan:  [place] possibly, will need Physical Therapy eval for disposition needs  In-House Referral:   [facility]     Status of Service:  In process, will continue to follow  Sherrilyn Rist [phone] 05/04/2018, 12:39 PM

## 2018-05-04 NOTE — H&P (Signed)
History and Physical    Michael [name] FGH:[id number] DOB: 06-30-1951 DOA: 05/03/2018  Referring MD/NP/PA:   PCP: [name], PA-C   Patient coming from:  The patient is coming from home.  At baseline, pt is independent for most of ADL.   Chief Complaint: chest pain and SOB  HPI: Michael [name] is a 67 y.o. male with medical history significant of hypertension, hyperlipidemia, diabetes mellitus, asthma, GERD, gout, depression, atrial fibrillation not on anticoagulants, CAD, CABG, collagen vascular disease, crack cocaine abuse, HIV (CD4=630 and VL<20 on 10/14/17), PE, CKD- 3, who presents with chest pain or shortness of breath.  Patient states that he has been having intermittent chest pain since May.  It happens almost every day.  The pain is located in substernal area, 6 out of 10 severity, sharp, radiating to the left shoulder, pleuritic, aggravated by deep breath and also by exertion.  It is associated with shortness of breath, but no cough. He states that his chest pain is more persistent and worse since yesterday. Patient does not have recent long distant traveling.  No tenderness in the calf area.  Patient denies nausea, vomiting, diarrhea, abdominal pain, symptoms of UTI or unilateral weakness.  He states that last cocaine use was more than a month ago  ED Course: pt was found to have negative troponin, positive d-dimer 0.88, worsening renal function, temperature normal, no tachycardia, oxygen saturation 100% on room air, negative chest x-ray.  Patient is placed on stepdown bed for observation.  Review of Systems:   General: no fevers, chills, no body weight gain, has fatigue HEENT: no blurry vision, hearing changes or sore throat Respiratory: has dyspnea, no coughing, wheezing CV: has chest pain, no palpitations GI: no nausea, vomiting, abdominal pain, diarrhea, constipation GU: no dysuria, burning on urination, increased urinary frequency, hematuria  Ext: no leg  edema Neuro: no unilateral weakness, numbness, or tingling, no vision change or hearing loss Skin: no rash, no skin tear. MSK: No muscle spasm, no deformity, no limitation of range of movement in spin Heme: No easy bruising.  Travel history: No recent long distant travel.  Allergy: No Known Allergies  Past Medical History:  Diagnosis Date  . A-fib ([place])   . Asthma    No PFTs, history of childhood asthma  . CAD (coronary artery disease)    a. 06/2013 STEMI/PCI ([facility]): LAD w/ thrombus (treated with BMS), mid 75%, D2 75%; LCX OM2 75%; RCA small, PDA 95%, PLV 95%;  b. 10/2013 Cath/PCI: ISR w/in LAD (Promus DES x 2), borderline OM2 lesion;  c. 01/2014 MV: Intermediate risk, medium-sized distal ant wall infarct w/ very small amt of peri-infarct ischemia. EF 60%.  . Cellulitis 04/2014   left facial  . Chondromalacia of medial femoral condyle    Left knee MRI 04/28/12: Chondromalacia of the medial femoral condyle with slight peripheral degeneration of the meniscocapsular junction of the medial meniscus; followed by sports medicine  . Collagen vascular disease ([place])   . Crack cocaine use    for 20+ years, has been enrolled in detox programs in the past  . Depression    with history of hospitalization for suicidal ideation  . Diabetes mellitus 2002   Diagnosed in 2002, started insulin in 2012  . Gout   . Gout 04/28/2012  . Headache(784.0)    CT head 08/2011: Periventricular and subcortical white matter hypodensities are most in keeping with chronic microangiopathic change  . HIV infection [facility]) Nov 2012   Followed by  [name]  . Hyperlipidemia   . Hypertension   . Pulmonary embolism [facility])     Past Surgical History:  Procedure Laterality Date  . BACK SURGERY     1988  . BOWEL RESECTION    . CARDIAC SURGERY    . CERVICAL SPINE SURGERY     " rods in my neck "  . CORONARY ARTERY BYPASS GRAFT    . CORONARY STENT PLACEMENT    . NM MYOCAR PERF WALL MOTION  12/27/2011   normal  . SPINE  SURGERY      Social History:  reports that he has never smoked. He has never used smokeless tobacco. He reports that he has current or past drug history. Drugs: "Crack" cocaine and Cocaine. He reports that he does not drink alcohol.  Family History:  Family History  Problem Relation Age of Onset  . Diabetes Mother   . Hypertension Mother   . Hyperlipidemia Mother   . Diabetes Father   . Cancer Father   . Hypertension Father   . Diabetes Brother   . Heart disease Brother   . Diabetes Sister   . Colon cancer Neg Hx      Prior to Admission medications   Medication Sig Start Date End Date Taking? Authorizing Provider  allopurinol (ZYLOPRIM) 300 MG tablet Take 1 tablet (300 mg total) by mouth daily. For gout. 11/25/17  Yes Lindell Spar I, NP  amantadine (SYMMETREL) 100 MG capsule Take 100 mg by mouth daily. 02/14/18  Yes [provider]  aspirin 81 MG chewable tablet Chew 81 mg by mouth daily.   Yes [provider]  colchicine 0.6 MG tablet Take 1 tablet (0.6 mg total) by mouth daily. 02/27/18  Yes Merlene Laughter F, NP  diltiazem (CARDIZEM CD) 120 MG 24 hr capsule Take 1 capsule (120 mg total) by mouth daily. For high blood pressure 11/25/17  Yes Lindell Spar I, NP  emtricitabine-rilpivir-tenofovir AF (ODEFSEY) 200-25-25 MG TABS tablet Take 1 tablet by mouth daily with breakfast. For HIV infection 11/24/17  Yes Nwoko, Herbert Pun I, NP  famotidine (PEPCID) 20 MG tablet Take 1 tablet (20 mg total) by mouth daily. For acid reflux 11/24/17  Yes Lindell Spar I, NP  gabapentin (NEURONTIN) 400 MG capsule Take 1 capsule (400 mg total) by mouth 2 (two) times daily. For agitation/neuropathic pain Patient taking differently: Take 400 mg by mouth 2 (two) times daily as needed (For agitation/neuropathic pain).  11/24/17  Yes Lindell Spar I, NP  hydrOXYzine (ATARAX/VISTARIL) 25 MG tablet Take 1 tablet (25 mg total) by mouth 3 (three) times daily as needed for anxiety. 11/24/17  Yes Lindell Spar I,  NP  insulin glargine (LANTUS) 100 UNIT/ML injection Inject 0.3 mLs (30 Units total) into the skin at bedtime. For diabetes management 11/24/17  Yes Lindell Spar I, NP  insulin lispro (HUMALOG) 100 UNIT/ML injection Inject 0.05 mLs (5 Units total) into the skin 3 (three) times daily with meals. For diabetes management Patient taking differently: Inject 4 Units into the skin 3 (three) times daily with meals. For diabetes management 11/24/17  Yes Lindell Spar I, NP  isosorbide mononitrate (IMDUR) 30 MG 24 hr tablet Take 2 tablets (60 mg total) by mouth daily. For chest 02/26/18  Yes Merlene Laughter F, NP  metFORMIN (GLUCOPHAGE) 1000 MG tablet Take 1 tablet (1,000 mg total) by mouth 2 (two) times daily with a meal. For diabetes management 11/24/17  Yes Lindell Spar I, NP  pravastatin (PRAVACHOL) 40 MG tablet  Take 1 tablet (40 mg total) by mouth at bedtime. For high cholesterol 11/24/17  Yes Nwoko, Herbert Pun I, NP  PROAIR HFA 108 (90 Base) MCG/ACT inhaler INHALE TWO PUFFS INTO THE LUNGS EVERY 6 (SIX) HOURS AS NEEDED FOR WHEEZING OR SHORTNESS OF BREATH. 02/24/18  Yes [name], MD  risperiDONE (RISPERDAL) 0.5 MG tablet Take 0.5 mg by mouth daily. 06/16/17  Yes [provider]  sertraline (ZOLOFT) 50 MG tablet Take 1 tablet (50 mg total) by mouth daily. For depression 11/25/17  Yes Lindell Spar I, NP  traZODone (DESYREL) 50 MG tablet Take 1 tablet (50 mg total) by mouth at bedtime as needed for sleep. 11/24/17  Yes Nwoko, Herbert Pun I, NP  ACCU-CHEK SOFTCLIX LANCETS lancets USE TO CHECK BLOOD GLUCOSE LEVELS UP TO 4 TIMES DAILY AS DIRECTED 11/24/17   Lindell Spar I, NP  albuterol (PROAIR HFA) 108 (90 Base) MCG/ACT inhaler INHALE TWO PUFFS INTO THE LUNGS EVERY 6 (SIX) HOURS AS NEEDED FOR WHEEZING OR SHORTNESS OF BREATH. Patient not taking: Reported on 05/03/2018 11/24/17   Lindell Spar I, NP  Insulin Syringe-Needle U-100 (B-D INS SYR MICROFINE 1CC/27G) 27G X 5/8" 1 ML MISC 1 strip by Does not apply route 3 (three)  times daily as needed. For blood sugar checks 11/24/17   Lindell Spar I, NP    Physical Exam: Vitals:   05/04/18 0545 05/04/18 0600 05/04/18 0615 05/04/18 0656  BP: 118/75 124/87 117/80 132/81  Pulse: 90 86 96 90  Resp: 17 18 13 10   Temp:    97.8 F (36.6 C)  TempSrc:    Oral  SpO2: 98% 96% 99% 95%  Weight:      Height:       General: Not in acute distress HEENT:       Eyes: PERRL, EOMI, no scleral icterus.       ENT: No discharge from the ears and nose, no pharynx injection, no tonsillar enlargement.        Neck: No JVD, no bruit, no mass felt. Heme: No neck lymph node enlargement. Cardiac: S1/S2, RRR, No murmurs, No gallops or rubs. Respiratory: No rales, wheezing, rhonchi or rubs. GI: Soft, nondistended, nontender, no rebound pain, no organomegaly, BS present. GU: No hematuria Ext: No pitting leg edema bilaterally. 2+DP/PT pulse bilaterally. Musculoskeletal: No joint deformities, No joint redness or warmth, no limitation of ROM in spin. Skin: No rashes.  Neuro: Alert, oriented X3, cranial nerves II-XII grossly intact, moves all extremities normally. Psych: Patient is not psychotic, no suicidal or hemocidal ideation.  Labs on Admission: I have personally reviewed following labs and imaging studies  CBC: Recent Labs  Lab 05/03/18 0053 05/03/18 2309  WBC 5.6 4.1  HGB 11.6* 10.5*  HCT 37.0* 33.1*  MCV 84.3 83.4  PLT 228 254   Basic Metabolic Panel: Recent Labs  Lab 05/03/18 0053 05/03/18 2309  NA 136 134*  K 4.3 4.2  CL 101 101  CO2 23 23  GLUCOSE 301* 506*  BUN 25* 28*  CREATININE 1.94* 2.00*  CALCIUM 9.1 9.0   GFR: Estimated Creatinine Clearance: 34.2 mL/min (A) (by C-G formula based on SCr of 2 mg/dL (H)). Liver Function Tests: No results for input(s): AST, ALT, ALKPHOS, BILITOT, PROT, ALBUMIN in the last 168 hours. No results for input(s): LIPASE, AMYLASE in the last 168 hours. No results for input(s): AMMONIA in the last 168 hours. Coagulation  Profile: No results for input(s): INR, PROTIME in the last 168 hours. Cardiac Enzymes: Recent Labs  Lab 05/04/18 0237  TROPONINI <0.03   BNP (last 3 results) No results for input(s): PROBNP in the last 8760 hours. HbA1C: Recent Labs    05/04/18 0237  HGBA1C 9.7*   CBG: Recent Labs  Lab 05/04/18 0654  GLUCAP 252*   Lipid Profile: Recent Labs    05/04/18 0237  CHOL 187  HDL 44  LDLCALC 110*  TRIG 166*  CHOLHDL 4.3   Thyroid Function Tests: No results for input(s): TSH, T4TOTAL, FREET4, T3FREE, THYROIDAB in the last 72 hours. Anemia Panel: No results for input(s): VITAMINB12, FOLATE, FERRITIN, TIBC, IRON, RETICCTPCT in the last 72 hours. Urine analysis:    Component Value Date/Time   COLORURINE YELLOW 02/20/2018 1733   APPEARANCEUR HAZY (A) 02/20/2018 1733   LABSPEC 1.023 02/20/2018 1733   PHURINE 6.0 02/20/2018 1733   GLUCOSEU 50 (A) 02/20/2018 1733   HGBUR NEGATIVE 02/20/2018 1733   BILIRUBINUR NEGATIVE 02/20/2018 1733   KETONESUR NEGATIVE 02/20/2018 1733   PROTEINUR 100 (A) 02/20/2018 1733   UROBILINOGEN 0.2 08/13/2015 1030   NITRITE NEGATIVE 02/20/2018 1733   LEUKOCYTESUR SMALL (A) 02/20/2018 1733   Sepsis Labs: @LABRCNTIP (procalcitonin:4,lacticidven:4) )No results found for this or any previous visit (from the past 240 hour(s)).   Radiological Exams on Admission: Dg Chest 2 View  Result Date: 05/03/2018 CLINICAL DATA:  Chest pain EXAM: CHEST - 2 VIEW COMPARISON:  05/03/2018 FINDINGS: Lungs are clear.  No pleural effusion or pneumothorax. The heart is normal in size. Postsurgical changes related to prior CABG. Degenerative changes of the thoracic spine. Cervical spine fixation hardware. Median sternotomy. IMPRESSION: No evidence of acute cardiopulmonary disease. Electronically Signed   By: Julian Hy M.D.   On: 05/03/2018 23:32   Dg Chest 2 View  Result Date: 05/03/2018 CLINICAL DATA:  Chest pain EXAM: CHEST - 2 VIEW COMPARISON:  04/17/2018, CT  chest 04/11/2018 FINDINGS: Postsurgical changes of the cervical spine. Post sternotomy. Mild elevation of right diaphragm. No acute airspace disease or effusion. Residual opacity in the medial right base. Stable cardiomediastinal silhouette. No pneumothorax. Degenerative changes of the spine. IMPRESSION: No acute interval change since 04/17/2018. Persistent mild opacity in the medial right base, may reflect slowly resolving pneumonia. Electronically Signed   By: Donavan Foil M.D.   On: 05/03/2018 01:28     EKG: Independently reviewed.  Not done in ED, will get one.   Assessment/Plan Principal Problem:   Chest pain Active Problems:   HIV (human immunodeficiency virus infection) ([name])   Hyperlipidemia with target LDL less than 100   Asthma, chronic   Cocaine use disorder, severe, dependence ([name])   Acute renal failure superimposed on stage 3 chronic kidney disease ([name])   Gout   Depression   Essential hypertension   Type II diabetes mellitus with renal manifestations ([name])   GERD (gastroesophageal reflux disease)   Hx of CAD and chest pain: s/p of CABG. patient has been having intermittent chest pain since May.  His chest pain has both pleuritic and exertional nature.  He has history of PE and positive D-dimer. Potential differential diagnosis is PE versus unstable angina.  Initial troponin negative.  - will place on Tele bed for obs-->change to SUD due to persistent CP - start IV heparin  - start IV NTG gtt-->hold off Imdur - cycle CE q6 x3 and repeat EKG in the am  - prn Morphine, and aspirin,  pravastatin - Risk factor stratification: will check FLP, UDS and A1C  - 2d echo - will get V/Q to  r/o PE - LE doppler to r/o DVT - inpt card consult was requested via Epic  HIV (human immunodeficiency virus infection) ([place]): CD4=630 and VL<20 on 10/14/17 -continue home HIV meds  Hyperlipidemia with target LDL less than 100: -Pravastatin  Asthma, chronic: No wheezing or rhonchi on  auscultation.  Stable. -PRN albuterol nebulizers  Cocaine use disorder, severe, dependence ([place]): -did counseling about importance of quitting substance use -check [place]  AoCKD-III: Baseline Cre is 1.5, pt's Cre is 2.00 and BUN 28 on admission. Likely due to dehydration and continuation  - IVF: 1L NS and the 75 cc/h - Follow up renal function by BMP - Check FeNa   Gout: -continue home allopurinol and Colchicine  Essential hypertension: -continue Cardizem -Hydralazine PRNIV   Type II diabetes mellitus with renal manifestations: Last A1c 10.6 on 12/26/16, poorly controled. Patient is taking metformin, Humalog, Lantus at home.  Blood sugar 506.  -5 Unit of NovoLog was order in ED --will decrease Lantus dose from 30 to 25 units daily -SSI  Depression: -continue home  meds  GERD: -Pepcid    DVT ppx: on IV Heparin      Code Status: Full code Family Communication: None at bed side.   Disposition Plan:  Anticipate discharge back to previous home environment Consults called:  none Admission status:   SDU/obs      Date of Service 05/04/2018    [name] Costa [facility] Pager [phone]  If 7PM-7AM, please contact night-coverage [email or web address] Password [email or web address] 05/04/2018, 7:11 AM

## 2018-05-04 NOTE — Consult Note (Signed)
Cardiology Consultation:   Patient ID: Michael [name]; [id number]; 04/01/1951   Admit date: 05/03/2018 Date of Consult: 05/04/2018  Primary Care Provider: [name], PA-C Primary Cardiologist: [facility] Primary Electrophysiologist:  None   Patient Profile:   Michael [name] is a 67 y.o. male with a PMH of CAD s/p CABG 01/2018, HTN, HLD, persistent atrial fibrillation s/p MAZE, DM type 2, PE, HIV, cocaine abuse, CKD stage 3, Asthma, GERD, gout, and depression who is being seen today for the evaluation of chest pain at the request of Dr. Thereasa Solo.  History of Present Illness:   Michael [name] has felt poorly since his CABG surgery in 01/2018. He reports constant left sided chest pain which radiates to his sternum. He reports exacerbation of symptoms with coughing, walking, and lifting anything. He reports some associated SOB and weakness. He states he has taken SL nitro a couple times a month without improvement of symptoms. He denies alleviating factors.   Patient has not followed with a cardiologist since his CABG surgery 01/2018, stating he never received any information in the mail. He reports compliance with his medications and states his PCP has been prescribing his cardiac medications. He continues to use cocaine intermittently and admits to using in the last month.   He continues to have constant chest pain at the time of this evaluation despite being on a nitro gtt. He had an episode of NBNB emesis this morning, perhaps related to taking his medications on an empty stomach. He denies orthopnea, PND, LE edema, dizziness, lightheadedness, or syncope.   Hospital course: intermittently hypertensive, otherwise VSS. Labs notable for electrolytes wnl, glucose 506, Cr 2.0 (baseline 1.6), Hgb 10.5 (baseline), PLT 205, Trop negative x3, BNP wnl, Ddimer 0.88, LDL 110, A1C 9.7. EKG with sinus rhythm with frequent PACs, no STE/D, no TWI. CXR without acute findings. Echo with EF 60-65% and  no wall motion abnormalities. LE dopplers without DVT. VQ scan pending. Patient was admitted to medicine and started on a heparin gtt/ nitro gtt with continuation of home medications. Cardiology asked to evaluate for chest pain.  Past Medical History:  Diagnosis Date  . A-fib ([place])   . Asthma    No PFTs, history of childhood asthma  . CAD (coronary artery disease)    a. 06/2013 STEMI/PCI ([facility]): LAD w/ thrombus (treated with BMS), mid 75%, D2 75%; LCX OM2 75%; RCA small, PDA 95%, PLV 95%;  b. 10/2013 Cath/PCI: ISR w/in LAD (Promus DES x 2), borderline OM2 lesion;  c. 01/2014 MV: Intermediate risk, medium-sized distal ant wall infarct w/ very small amt of peri-infarct ischemia. EF 60%.  . Cellulitis 04/2014   left facial  . Chondromalacia of medial femoral condyle    Left knee MRI 04/28/12: Chondromalacia of the medial femoral condyle with slight peripheral degeneration of the meniscocapsular junction of the medial meniscus; followed by sports medicine  . Collagen vascular disease ([place])   . Crack cocaine use    for 20+ years, has been enrolled in detox programs in the past  . Depression    with history of hospitalization for suicidal ideation  . Diabetes mellitus 2002   Diagnosed in 2002, started insulin in 2012  . Gout   . Gout 04/28/2012  . Headache(784.0)    CT head 08/2011: Periventricular and subcortical white matter hypodensities are most in keeping with chronic microangiopathic change  . HIV infection [facility]) Nov 2012   Followed by [name]  . Hyperlipidemia   . Hypertension   .  Pulmonary embolism [facility])     Past Surgical History:  Procedure Laterality Date  . BACK SURGERY     1988  . BOWEL RESECTION    . CARDIAC SURGERY    . CERVICAL SPINE SURGERY     " rods in my neck "  . CORONARY ARTERY BYPASS GRAFT    . CORONARY STENT PLACEMENT    . NM MYOCAR PERF WALL MOTION  12/27/2011   normal  . SPINE SURGERY       Home Medications:  Prior to Admission medications   Medication  Sig Start Date End Date Taking? Authorizing Provider  allopurinol (ZYLOPRIM) 300 MG tablet Take 1 tablet (300 mg total) by mouth daily. For gout. 11/25/17  Yes Lindell Spar I, NP  amantadine (SYMMETREL) 100 MG capsule Take 100 mg by mouth daily. 02/14/18  Yes [provider]  aspirin 81 MG chewable tablet Chew 81 mg by mouth daily.   Yes [provider]  colchicine 0.6 MG tablet Take 1 tablet (0.6 mg total) by mouth daily. 02/27/18  Yes Merlene Laughter F, NP  diltiazem (CARDIZEM CD) 120 MG 24 hr capsule Take 1 capsule (120 mg total) by mouth daily. For high blood pressure 11/25/17  Yes Lindell Spar I, NP  emtricitabine-rilpivir-tenofovir AF (ODEFSEY) 200-25-25 MG TABS tablet Take 1 tablet by mouth daily with breakfast. For HIV infection 11/24/17  Yes Nwoko, Herbert Pun I, NP  famotidine (PEPCID) 20 MG tablet Take 1 tablet (20 mg total) by mouth daily. For acid reflux 11/24/17  Yes Lindell Spar I, NP  gabapentin (NEURONTIN) 400 MG capsule Take 1 capsule (400 mg total) by mouth 2 (two) times daily. For agitation/neuropathic pain Patient taking differently: Take 400 mg by mouth 2 (two) times daily as needed (For agitation/neuropathic pain).  11/24/17  Yes Lindell Spar I, NP  hydrOXYzine (ATARAX/VISTARIL) 25 MG tablet Take 1 tablet (25 mg total) by mouth 3 (three) times daily as needed for anxiety. 11/24/17  Yes Lindell Spar I, NP  insulin glargine (LANTUS) 100 UNIT/ML injection Inject 0.3 mLs (30 Units total) into the skin at bedtime. For diabetes management 11/24/17  Yes Lindell Spar I, NP  insulin lispro (HUMALOG) 100 UNIT/ML injection Inject 0.05 mLs (5 Units total) into the skin 3 (three) times daily with meals. For diabetes management Patient taking differently: Inject 4 Units into the skin 3 (three) times daily with meals. For diabetes management 11/24/17  Yes Lindell Spar I, NP  isosorbide mononitrate (IMDUR) 30 MG 24 hr tablet Take 2 tablets (60 mg total) by mouth daily. For chest 02/26/18  Yes  Merlene Laughter F, NP  metFORMIN (GLUCOPHAGE) 1000 MG tablet Take 1 tablet (1,000 mg total) by mouth 2 (two) times daily with a meal. For diabetes management 11/24/17  Yes Lindell Spar I, NP  pravastatin (PRAVACHOL) 40 MG tablet Take 1 tablet (40 mg total) by mouth at bedtime. For high cholesterol 11/24/17  Yes Nwoko, Herbert Pun I, NP  PROAIR HFA 108 (90 Base) MCG/ACT inhaler INHALE TWO PUFFS INTO THE LUNGS EVERY 6 (SIX) HOURS AS NEEDED FOR WHEEZING OR SHORTNESS OF BREATH. 02/24/18  Yes [name], MD  risperiDONE (RISPERDAL) 0.5 MG tablet Take 0.5 mg by mouth daily. 06/16/17  Yes [provider]  sertraline (ZOLOFT) 50 MG tablet Take 1 tablet (50 mg total) by mouth daily. For depression 11/25/17  Yes Lindell Spar I, NP  traZODone (DESYREL) 50 MG tablet Take 1 tablet (50 mg total) by mouth at bedtime as needed for sleep.  11/24/17  Yes Nwoko, Herbert Pun I, NP  ACCU-CHEK SOFTCLIX LANCETS lancets USE TO CHECK BLOOD GLUCOSE LEVELS UP TO 4 TIMES DAILY AS DIRECTED 11/24/17   Lindell Spar I, NP  albuterol (PROAIR HFA) 108 (90 Base) MCG/ACT inhaler INHALE TWO PUFFS INTO THE LUNGS EVERY 6 (SIX) HOURS AS NEEDED FOR WHEEZING OR SHORTNESS OF BREATH. Patient not taking: Reported on 05/03/2018 11/24/17   Lindell Spar I, NP  Insulin Syringe-Needle U-100 (B-D INS SYR MICROFINE 1CC/27G) 27G X 5/8" 1 ML MISC 1 strip by Does not apply route 3 (three) times daily as needed. For blood sugar checks 11/24/17   Lindell Spar I, NP    Inpatient Medications: Scheduled Meds: . amantadine  100 mg Oral Daily  . aspirin  324 mg Oral Once  . colchicine  0.6 mg Oral Daily  . diltiazem  120 mg Oral Daily  . emtricitabine-rilpivir-tenofovir AF  1 tablet Oral Q breakfast  . famotidine  20 mg Oral Daily  . insulin aspart  0-9 Units Subcutaneous TID WC  . insulin glargine  25 Units Subcutaneous QHS  . pravastatin  40 mg Oral QHS  . risperiDONE  0.5 mg Oral Daily  . sertraline  50 mg Oral Daily   Continuous Infusions: . sodium  chloride 75 mL/hr at 05/04/18 0518  . heparin 1,350 Units/hr (05/04/18 0507)  . nitroGLYCERIN 105 mcg/min (05/04/18 0618)   PRN Meds: acetaminophen, albuterol, ALPRAZolam, gabapentin, hydrALAZINE, hydrOXYzine, morphine injection, nitroGLYCERIN, ondansetron (ZOFRAN) IV, perflutren lipid microspheres (DEFINITY) IV suspension, traZODone, zolpidem  Allergies:   No Known Allergies  Social History:   Social History   Socioeconomic History  . Marital status: Divorced    Spouse name: Not on file  . Number of children: Not on file  . Years of education: 66  . Highest education level: Not on file  Occupational History    Employer: UNEMPLOYED    Comment: 04/2016  Social Needs  . Financial resource strain: Not on file  . Food insecurity:    Worry: Not on file    Inability: Not on file  . Transportation needs:    Medical: Not on file    Non-medical: Not on file  Tobacco Use  . Smoking status: Never Smoker  . Smokeless tobacco: Never Used  Substance and Sexual Activity  . Alcohol use: No    Alcohol/week: 2.4 oz    Types: 2 Cans of beer, 2 Shots of liquor per week  . Drug use: Yes    Types: "Crack" cocaine, Cocaine    Comment: last use January 2018  . Sexual activity: Yes    Comment: accepted condoms  Lifestyle  . Physical activity:    Days per week: Not on file    Minutes per session: Not on file  . Stress: Not on file  Relationships  . Social connections:    Talks on phone: Not on file    Gets together: Not on file    Attends religious service: Not on file    Active member of club or organization: Not on file    Attends meetings of clubs or organizations: Not on file    Relationship status: Not on file  . Intimate partner violence:    Fear of current or ex partner: Not on file    Emotionally abused: Not on file    Physically abused: Not on file    Forced sexual activity: Not on file  Other Topics Concern  . Not on file  Social History Narrative  Currently staying with  a friend in [place].  Was staying @ local motel until a few days ago - left b/c of bed bugs.    Family History:    Family History  Problem Relation Age of Onset  . Diabetes Mother   . Hypertension Mother   . Hyperlipidemia Mother   . Diabetes Father   . Cancer Father   . Hypertension Father   . Diabetes Brother   . Heart disease Brother   . Diabetes Sister   . Colon cancer Neg Hx      ROS:  Please see the history of present illness.   All other ROS reviewed and negative.     Physical Exam/Data:   Vitals:   05/04/18 0600 05/04/18 0615 05/04/18 0656 05/04/18 1007  BP: 124/87 117/80 132/81 132/68  Pulse: 86 96 90   Resp: 18 13 10    Temp:   97.8 F (36.6 C)   TempSrc:   Oral   SpO2: 96% 99% 95%   Weight:      Height:        Intake/Output Summary (Last 24 hours) at 05/04/2018 1141 Last data filed at 05/04/2018 0917 Gross per 24 hour  Intake 0 ml  Output -  Net 0 ml   Filed Weights   05/03/18 2302  Weight: 164 lb (74.4 kg)   Body mass index is 24.94 kg/m.  General:  AAM who appears slightly older than stated age, laying in bed in no acute distress HEENT: sclera anicteric  Neck: no JVD Vascular: No carotid bruits; distal pulses 2+ bilaterally Cardiac:  normal S1, S2; RRR; no murmurs, gallops, or rubs. Diffuse left-sided/central chest wall TTP Lungs:  clear to auscultation bilaterally, no wheezing, rhonchi or rales  Abd: NABS, soft, nontender, no hepatomegaly Ext: no edema Musculoskeletal:  No deformities, BUE and BLE strength normal and equal Skin: warm and dry  Neuro:  CNs 2-12 intact, no focal abnormalities noted Psych:  Normal affect   EKG:  The EKG was personally reviewed and demonstrates:  sinus rhythm with frequent PACs, no STE/D, no TWI. Telemetry:  Telemetry was personally reviewed and demonstrates:  Sinus rhythm with occasional PACs/PVCs  Relevant CV Studies: Echocardiogram 05/04/18: Study Conclusions  - Left ventricle: The cavity size was normal.  There was mild   concentric hypertrophy. Systolic function was normal. The   estimated ejection fraction was in the range of 60% to 65%. Wall   motion was normal; there were no regional wall motion   abnormalities. Left ventricular diastolic function parameters   were normal. - Aortic valve: Trileaflet; normal thickness, mildly calcified   leaflets. - Pulmonary arteries: Systolic pressure could not be accurately   estimated.  Left heart catheterization 01/2018:   Multi-vessel coronary artery disease.  High left ventricle end diastolic pressure.  LCP for CCS3 angina with known CAD  Radial 42F. Mild difficulty navigating to root - serial angios/glidewire  through brachial/innom.   Marked catheter dampening with engagement of LAD - small / short LM - LCX  with obstructive OM1, native LCX ok. LAD with marked dampening and ST dep  in ostial LAD - right at stent - 70% with diffuse 30% ISR.   RCA with serial high grade lesions mid/distal RCA with R-L collaterals to  LAD.   Hypertensive through case.   Elevated LVEDP - no LV gram. Will get tte in holding   EBL <37mL. No specimens. PreludeSync band for hemostasis to radial site.   Will get CTS eval  for CABG options  Laboratory Data:  Chemistry Recent Labs  Lab 05/03/18 0053 05/03/18 2309  NA 136 134*  K 4.3 4.2  CL 101 101  CO2 23 23  GLUCOSE 301* 506*  BUN 25* 28*  CREATININE 1.94* 2.00*  CALCIUM 9.1 9.0  GFRNONAA 34* 33*  GFRAA 39* 38*  ANIONGAP 12 10    No results for input(s): PROT, ALBUMIN, AST, ALT, ALKPHOS, BILITOT in the last 168 hours. Hematology Recent Labs  Lab 05/03/18 0053 05/03/18 2309  WBC 5.6 4.1  RBC 4.39 3.97*  HGB 11.6* 10.5*  HCT 37.0* 33.1*  MCV 84.3 83.4  MCH 26.4 26.4  MCHC 31.4 31.7  RDW 14.1 13.7  PLT 228 205   Cardiac Enzymes Recent Labs  Lab 05/04/18 0237 05/04/18 0737  TROPONINI <0.03 <0.03    Recent Labs  Lab 05/03/18 0105 05/03/18 0546 05/03/18 2316  TROPIPOC 0.00  0.00 0.00    BNP Recent Labs  Lab 05/04/18 0237  BNP 36.1    DDimer  Recent Labs  Lab 05/04/18 0237  DDIMER 0.88*    Radiology/Studies:  Dg Chest 2 View  Result Date: 05/03/2018 CLINICAL DATA:  Chest pain EXAM: CHEST - 2 VIEW COMPARISON:  05/03/2018 FINDINGS: Lungs are clear.  No pleural effusion or pneumothorax. The heart is normal in size. Postsurgical changes related to prior CABG. Degenerative changes of the thoracic spine. Cervical spine fixation hardware. Median sternotomy. IMPRESSION: No evidence of acute cardiopulmonary disease. Electronically Signed   By: Julian Hy M.D.   On: 05/03/2018 23:32   Dg Chest 2 View  Result Date: 05/03/2018 CLINICAL DATA:  Chest pain EXAM: CHEST - 2 VIEW COMPARISON:  04/17/2018, CT chest 04/11/2018 FINDINGS: Postsurgical changes of the cervical spine. Post sternotomy. Mild elevation of right diaphragm. No acute airspace disease or effusion. Residual opacity in the medial right base. Stable cardiomediastinal silhouette. No pneumothorax. Degenerative changes of the spine. IMPRESSION: No acute interval change since 04/17/2018. Persistent mild opacity in the medial right base, may reflect slowly resolving pneumonia. Electronically Signed   By: Donavan Foil M.D.   On: 05/03/2018 01:28    Assessment and Plan:   1. Atypical chest pain in patient with CAD s/p CABG 01/2018: patient reports constant chest pain since CABG with exacerbation of symptoms with coughing, walking, or lifting things. Also with significant TTP of left chest wall. Trops have been negative. EKG without ischemic changes. Echo with EF 60-65% and no wall motion abnormalities. Symptoms more consistent with musculoskeletal pain.  - Trial lidocaine patch - Will stop nitro gtt and transition back to home imdur as pain not improved with nitro - Continue aspirin, statin, and hydralazine - No further ischemic work-up indicated at this time. Could consider outpatient stress test  2.  Elevated Ddimer: patient reports constant chest pain and SOB. LE dopplers negative for DVT. V/Q scan pending to further evaluate  - Continue heparin gtt for now. If V/Q scan is negative, no indication to continue heparin from a cardiology standpoint  3. HTN: BP elevated but overall stable - Continue hydralazine and diltiazem  4. DM type 2: appears poorly controlled with BG >500 this admission and Hgb A1C 9.7; goal <7 - Encourage medication and dietary compliance.  - Would benefit from close outpatient follow-up with PCP for better glycemic control  5. Acute on chronic kidney disease stage 3: Cr 2.0 on admission, baseline ~1.6.  - Continue to monitor closely  6. HLD: LDL 110; goal <70 - Encouraged medication and  dietary compliance for better cholesterol control - Continue statin  7. Cocaine abuse: patient endorses use within the past month. Utox not collected this admission. Educated on risks of cocaine use. - Avoid BBlockers  - Continue to encourage abstinence.   8. Persistent atrial fibrillation: underwent MAZE at the time of CABG surgery. Maintaining sinus rhythm this admission   Patient was lost to follow-up after CABG surgery. Will connect with [facility] in [place] going forward to hopefully improve compliance.    For questions or updates, please contact [place] Please consult [email or web address] for contact info under Cardiology/STEMI.   Signed, Abigail Butts, PA-C  05/04/2018 11:41 AM [phone]

## 2018-05-04 NOTE — ED Notes (Signed)
IV attempts x2 unsuccessful.  

## 2018-05-04 NOTE — Progress Notes (Signed)
**  Preliminary report by tech**  Bilateral lower extremity venous duplex completed. There is no evidence of deep or superficial vein thrombosis involving the right and left lower extremities. All visualized vessels appear patent and compressible. There is no evidence of Baker's cysts bilaterally.  Abram Sander 05/04/2018 9:08 AM

## 2018-05-04 NOTE — ED Notes (Signed)
Pt reports he has had chest pain and SOB since having open heart surgery on January 29, 2018. Pt states nothing helps with this pain. Pt also states that he was just here last night and discharged home still feeling the same.

## 2018-05-04 NOTE — Plan of Care (Signed)

## 2018-05-04 NOTE — Progress Notes (Signed)
Pt received from ED, Ao x4. Report received from scott RN @0631 . Stated cp 5/10. Nitro drip running at 31.79ml/hr, NS at 49ml/hr and Heparin @ 56ml/hr. Placed pt on 2 l o2 via [place]. CHG bath completed. CCMD notified. Will pass on to day shift rn.

## 2018-05-05 DIAGNOSIS — Z951 Presence of aortocoronary bypass graft: Secondary | ICD-10-CM | POA: Diagnosis not present

## 2018-05-05 DIAGNOSIS — I10 Essential (primary) hypertension: Secondary | ICD-10-CM | POA: Diagnosis not present

## 2018-05-05 DIAGNOSIS — R072 Precordial pain: Secondary | ICD-10-CM | POA: Diagnosis not present

## 2018-05-05 DIAGNOSIS — E785 Hyperlipidemia, unspecified: Secondary | ICD-10-CM | POA: Diagnosis not present

## 2018-05-05 LAB — COMPREHENSIVE METABOLIC PANEL
ALT: 10 U/L (ref 0–44)
AST: 13 U/L — ABNORMAL LOW (ref 15–41)
Albumin: 3 g/dL — ABNORMAL LOW (ref 3.5–5.0)
Alkaline Phosphatase: 69 U/L (ref 38–126)
Anion gap: 7 (ref 5–15)
BUN: 12 mg/dL (ref 8–23)
CHLORIDE: 112 mmol/L — AB (ref 98–111)
CO2: 21 mmol/L — ABNORMAL LOW (ref 22–32)
CREATININE: 1.19 mg/dL (ref 0.61–1.24)
Calcium: 8.8 mg/dL — ABNORMAL LOW (ref 8.9–10.3)
GFR calc non Af Amer: >60 mL/min (ref >60)
Glucose, Bld: 146 mg/dL — ABNORMAL HIGH (ref 70–99)
POTASSIUM: 4.5 mmol/L (ref 3.5–5.1)
Sodium: 140 mmol/L (ref 135–145)
Total Bilirubin: 0.5 mg/dL (ref 0.3–1.2)
Total Protein: 6 g/dL — ABNORMAL LOW (ref 6.5–8.1)

## 2018-05-05 LAB — CBC
HCT: 30.5 % — ABNORMAL LOW (ref 39.0–52.0)
Hemoglobin: 9.8 g/dL — ABNORMAL LOW (ref 13.0–17.0)
MCH: 26 pg (ref 26.0–34.0)
MCHC: 32.1 g/dL (ref 30.0–36.0)
MCV: 80.9 fL (ref 78.0–100.0)
PLATELETS: 146 10*3/uL — AB (ref 150–400)
RBC: 3.77 MIL/uL — AB (ref 4.22–5.81)
RDW: 13.7 % (ref 11.5–15.5)
WBC: 4.4 10*3/uL (ref 4.0–10.5)

## 2018-05-05 LAB — GLUCOSE, CAPILLARY
GLUCOSE-CAPILLARY: 165 mg/dL — AB (ref 70–99)
Glucose-Capillary: 129 mg/dL — ABNORMAL HIGH (ref 70–99)

## 2018-05-05 NOTE — Evaluation (Signed)
Physical Therapy Evaluation & Discharge Patient Details Name: Michael [name] MRN: [id number] DOB: [birth year] Today's Date: 05/05/2018   History of Present Illness  Pt is a 67 y.o. male admitted 05/03/18 with c/o intermittent chest pain and SOB of many months duration. Found to have negative troponin, positive d-dimer and negative CXR. VQ scan negative for PE. PMH includes cocaine abuse, HIV, HTN, DM2, gout, s/p CABG, CKDIII.    Clinical Impression  Patient evaluated by Physical Therapy with no further acute PT needs identified. PTA, pt reports living with sister and mod indep with intermittent use of SPC. Today, pt able to transfer and ambulate independently. Pt plans to d/c home today; alerted SW of need for bus ticket. All education has been completed and the patient has no further questions. PT is signing off. Thank you for this referral.    Follow Up Recommendations No PT follow up;Supervision - Intermittent    Equipment Recommendations  None recommended by PT    Recommendations for Other Services       Precautions / Restrictions Precautions Precautions: None Restrictions Weight Bearing Restrictions: No      Mobility  Bed Mobility Overal bed mobility: Independent                Transfers Overall transfer level: Independent Equipment used: None                Ambulation/Gait Ambulation/Gait assistance: Independent Gait Distance (Feet): 150 Feet Assistive device: None Gait Pattern/deviations: Step-through pattern;Decreased stride length Gait velocity: Decreased Gait velocity interpretation: 1.31 - 2.62 ft/sec, indicative of limited community ambulator General Gait Details: Initial self-correct lateral sway to be expected after laying in bed 2 days. Pt progressing to indep with amb  Stairs Stairs: (Simulated ascending steps by high marching with single UE support on rail)          Wheelchair Mobility    Modified Rankin (Stroke Patients Only)        Balance Overall balance assessment: Needs assistance   Sitting balance-Leahy Scale: Good       Standing balance-Leahy Scale: Good                               Pertinent Vitals/Pain Pain Assessment: 0-10 Pain Score: 3  Pain Location: Chest Pain Descriptors / Indicators: Sore Pain Intervention(s): Monitored during session;Limited activity within patient's tolerance    Home Living Family/patient expects to be discharged to:: Private residence Living Arrangements: Other relatives(Sister) Available Help at Discharge: Family;Available PRN/intermittently Type of Home: Apartment Home Access: Level entry     Home Layout: One level Home Equipment: Cane - single point      Prior Function Level of Independence: Independent with assistive device(s)         Comments: Reports mod indep with SPC. Relies on bus or friends for transportation     Hand Dominance        Extremity/Trunk Assessment   Upper Extremity Assessment Upper Extremity Assessment: Overall WFL for tasks assessed    Lower Extremity Assessment Lower Extremity Assessment: Overall WFL for tasks assessed    Cervical / Trunk Assessment Cervical / Trunk Assessment: Normal  Communication   Communication: No difficulties  Cognition Arousal/Alertness: Awake/alert Behavior During Therapy: Flat affect Overall Cognitive Status: No family/caregiver present to determine baseline cognitive functioning Area of Impairment: Attention;Problem solving                   Current  Attention Level: Selective         Problem Solving: Slow processing;Requires verbal cues General Comments: Likely baseline cognition      General Comments      Exercises     Assessment/Plan    PT Assessment Patent does not need any further PT services  PT Problem List         PT Treatment Interventions      PT Goals (Current goals can be found in the Care Plan section)  Acute Rehab PT Goals PT Goal  Formulation: All assessment and education complete, DC therapy    Frequency     Barriers to discharge        Co-evaluation               AM-PAC PT "6 Clicks" Daily Activity  Outcome Measure Difficulty turning over in bed (including adjusting bedclothes, sheets and blankets)?: None Difficulty moving from lying on back to sitting on the side of the bed? : None Difficulty sitting down on and standing up from a chair with arms (e.g., wheelchair, bedside commode, etc,.)?: None Help needed moving to and from a bed to chair (including a wheelchair)?: None Help needed walking in hospital room?: None Help needed climbing 3-5 steps with a railing? : A Little 6 Click Score: 23    End of Session Equipment Utilized During Treatment: Gait belt Activity Tolerance: Patient tolerated treatment well Patient left: in bed;with call bell/phone within reach;with nursing/sitter in room(seated EOB) Nurse Communication: Mobility status PT Visit Diagnosis: Other abnormalities of gait and mobility (R26.89)    Time: 9357-0177 PT Time Calculation (min) (ACUTE ONLY): 17 min   Charges:   PT Evaluation $PT Eval Moderate Complexity: 1 Mod         Mabeline Caras, PT, DPT Acute Rehab Services  Pager: [place] 05/05/2018, 3:13 PM

## 2018-05-05 NOTE — Care Management Note (Signed)
Case Management Note  Patient Details  Name: Michael [name] MRN: [id number] Date of Birth: 1950/10/19  Subjective/Objective:   Pt presented for Chest Pain. PTA independent from home. Plan to transition home today.                  Action/Plan: No home needs identified by CM at this time. Bus Pass issued to patient for transportation home. No further needs from CM at this time.   Expected Discharge Date:  05/05/18               Expected Discharge Plan:  Home/Self Care  In-House Referral:  NA  Discharge planning Services  CM Consult  Post Acute Care Choice:  NA Choice offered to:  NA  DME Arranged:  N/A DME Agency:  NA  HH Arranged:  NA HH Agency:  NA  Status of Service:  Completed, signed off  If discussed at [place] of Stay Meetings, dates discussed:    Additional Comments:  Bethena Roys, RN 05/05/2018, 3:25 PM

## 2018-05-05 NOTE — Discharge Summary (Signed)
Physician Discharge Summary  Michael [name] QJJ:[id number] DOB: 09-16-1951 DOA: 05/03/2018  PCP: [name], PA-C  Admit date: 05/03/2018 Discharge date: 05/05/2018  Admitted From: Home Disposition: Home Recommendations for Outpatient Follow-up:  1. Follow up with PCP in 1-2 weeks 2. Please obtain BMP/CBC in one week  Equipment/Devices: None  Discharge Condition: Stable CODE STATUS full code Diet recommendation: Cardiac Brief/Interim Summary:67yo M w/ a hx of HTN, HLD, DM2, asthma, GERD, gout, depression, atrial fibrillation not on anticoagulants, CAD s/p CABG, collagen vascular disease, crack cocaine abuse, HIV(CD4=630 and VL<20 on 10/14/17), PE, and CKD 3 who presented with intermittent chest pain and shortness of breath of many months duration.  In the ED he was found to havea negative troponin, positive d-dimer 0.88, worsening renal function, oxygen saturation 100% on room air, and a negative chest x-ray.    Discharge Diagnoses:  Principal Problem:   Precordial chest pain Active Problems:   HIV (human immunodeficiency virus infection) ([place])   Hyperlipidemia with target LDL less than 100   Asthma, chronic   Cocaine use disorder, severe, dependence ([name])   Acute renal failure superimposed on stage 3 chronic kidney disease ([name])   Gout   Depression   Essential hypertension   Type II diabetes mellitus with renal manifestations ([name])   GERD (gastroesophageal reflux disease)   S/P CABG (coronary artery bypass graft)  Atypical chest pain patient admitted with chest pain and shortness of breath work-up essentially negative VQ scan negative.  Patient was followed by cardiology who did not recommend any further intervention other than keeping his blood pressure under control and following up with outpatient cardiologist.  Since patient had an elevated d-dimer a VQ scan was ordered which was negative for PE.  Patient has a history of cocaine abuse.  History of HIV  follow-up with infectious disease as an outpatient.   AKI improved.  Gout continue colchicine and allopurinol.  Type 2 diabetes A1c 9.7 continue home dose of long-acting and short acting insulin.  I have stopped his metformin since it is of not much use at this time especially with the fact that he is on ACE inhibitor antiretrovirals and colchicine and allopurinol with a history of AKI.   Discharge Instructions   Allergies as of 05/05/2018   No Known Allergies     Medication List    STOP taking these medications   metFORMIN 1000 MG tablet Commonly known as:  GLUCOPHAGE     TAKE these medications   ACCU-CHEK SOFTCLIX LANCETS lancets USE TO CHECK BLOOD GLUCOSE LEVELS UP TO 4 TIMES DAILY AS DIRECTED   albuterol 108 (90 Base) MCG/ACT inhaler Commonly known as:  PROAIR HFA INHALE TWO PUFFS INTO THE LUNGS EVERY 6 (SIX) HOURS AS NEEDED FOR WHEEZING OR SHORTNESS OF BREATH.   PROAIR HFA 108 (90 Base) MCG/ACT inhaler Generic drug:  albuterol INHALE TWO PUFFS INTO THE LUNGS EVERY 6 (SIX) HOURS AS NEEDED FOR WHEEZING OR SHORTNESS OF BREATH.   allopurinol 300 MG tablet Commonly known as:  ZYLOPRIM Take 1 tablet (300 mg total) by mouth daily. For gout.   amantadine 100 MG capsule Commonly known as:  SYMMETREL Take 100 mg by mouth daily.   aspirin 81 MG chewable tablet Chew 81 mg by mouth daily.   colchicine 0.6 MG tablet Take 1 tablet (0.6 mg total) by mouth daily.   diltiazem 120 MG 24 hr capsule Commonly known as:  CARDIZEM CD Take 1 capsule (120 mg total) by mouth daily. For high blood  pressure   emtricitabine-rilpivir-tenofovir AF 200-25-25 MG Tabs tablet Commonly known as:  ODEFSEY Take 1 tablet by mouth daily with breakfast. For HIV infection   famotidine 20 MG tablet Commonly known as:  PEPCID Take 1 tablet (20 mg total) by mouth daily. For acid reflux   gabapentin 400 MG capsule Commonly known as:  NEURONTIN Take 1 capsule (400 mg total) by mouth 2 (two) times  daily. For agitation/neuropathic pain What changed:    when to take this  reasons to take this  additional instructions   hydrOXYzine 25 MG tablet Commonly known as:  ATARAX/VISTARIL Take 1 tablet (25 mg total) by mouth 3 (three) times daily as needed for anxiety.   insulin glargine 100 UNIT/ML injection Commonly known as:  LANTUS Inject 0.3 mLs (30 Units total) into the skin at bedtime. For diabetes management   insulin lispro 100 UNIT/ML injection Commonly known as:  HUMALOG Inject 0.05 mLs (5 Units total) into the skin 3 (three) times daily with meals. For diabetes management What changed:    how much to take  additional instructions   Insulin Syringe-Needle U-100 27G X 5/8" 1 ML Misc Commonly known as:  B-D INS SYR MICROFINE 1CC/27G 1 strip by Does not apply route 3 (three) times daily as needed. For blood sugar checks   isosorbide mononitrate 30 MG 24 hr tablet Commonly known as:  IMDUR Take 2 tablets (60 mg total) by mouth daily. For chest   pravastatin 40 MG tablet Commonly known as:  PRAVACHOL Take 1 tablet (40 mg total) by mouth at bedtime. For high cholesterol   risperiDONE 0.5 MG tablet Commonly known as:  RISPERDAL Take 0.5 mg by mouth daily.   sertraline 50 MG tablet Commonly known as:  ZOLOFT Take 1 tablet (50 mg total) by mouth daily. For depression   traZODone 50 MG tablet Commonly known as:  DESYREL Take 1 tablet (50 mg total) by mouth at bedtime as needed for sleep.      Follow-up Information    Revankar, Reita Cliche, MD Follow up on 05/25/2018.   Specialty:  Cardiology Why:  Please arrive 15 minutes early for your 11:20am cardiology appointment Contact information: [place] [address] [phone]        [name], PA-C Follow up.   Specialty:  Physician Assistant Contact information: [place] [id number] [phone]        [name], MD .   Specialty:   Infectious Diseases Contact information: [place] [address] [phone]          No Known Allergies  Consultations:     Procedures/Studies: Dg Chest 2 View  Result Date: 05/03/2018 CLINICAL DATA:  Chest pain EXAM: CHEST - 2 VIEW COMPARISON:  05/03/2018 FINDINGS: Lungs are clear.  No pleural effusion or pneumothorax. The heart is normal in size. Postsurgical changes related to prior CABG. Degenerative changes of the thoracic spine. Cervical spine fixation hardware. Median sternotomy. IMPRESSION: No evidence of acute cardiopulmonary disease. Electronically Signed   By: Julian Hy M.D.   On: 05/03/2018 23:32   Dg Chest 2 View  Result Date: 05/03/2018 CLINICAL DATA:  Chest pain EXAM: CHEST - 2 VIEW COMPARISON:  04/17/2018, CT chest 04/11/2018 FINDINGS: Postsurgical changes of the cervical spine. Post sternotomy. Mild elevation of right diaphragm. No acute airspace disease or effusion. Residual opacity in the medial right base. Stable cardiomediastinal silhouette. No pneumothorax. Degenerative  changes of the spine. IMPRESSION: No acute interval change since 04/17/2018. Persistent mild opacity in the medial right base, may reflect slowly resolving pneumonia. Electronically Signed   By: Donavan Foil M.D.   On: 05/03/2018 01:28   Nm Pulmonary Perf And Vent  Result Date: 05/04/2018 CLINICAL DATA:  Chest pain and shortness of breath daily for 2 months. Elevated D-dimer levels. EXAM: NUCLEAR MEDICINE VENTILATION - PERFUSION LUNG SCAN TECHNIQUE: Ventilation images were obtained in multiple projections using inhaled aerosol Tc-57m DTPA. Perfusion images were obtained in multiple projections after intravenous injection of Tc-35m-MAA. RADIOPHARMACEUTICALS:  32.2 mCi of Tc-82m DTPA aerosol inhalation and 4.2 mCi Tc12m-MAA IV COMPARISON:  Chest radiographs 05/03/2018.  Chest CTA 04/11/2018. FINDINGS: Ventilation: No focal ventilation defect. There is mild clumping of  the aerosol in the trachea and left central airways. There is hypoventilation of the right lung base, similar to previous studies. Perfusion: No wedge shaped peripheral perfusion defects to suggest acute pulmonary embolism. IMPRESSION: No evidence of acute or chronic pulmonary thromboembolic disease. Electronically Signed   By: Richardean Sale M.D.   On: 05/04/2018 17:07   Dg Chest Portable 1 View  Result Date: 04/17/2018 CLINICAL DATA:  Shortness of breath EXAM: PORTABLE CHEST 1 VIEW COMPARISON:  Chest radiograph and chest CT April 11, 2018 FINDINGS: There is persistent consolidation in the right base region. There is persistent elevation of the right hemidiaphragm. Lungs elsewhere clear. Heart size and pulmonary vascularity are normal. No adenopathy. Patient is status post median sternotomy. There is a left atrial appendage clamp. No adenopathy. There is postoperative change in the lower cervical region. IMPRESSION: Persistent consolidation right base. Persistent elevation right hemidiaphragm. No new opacity. Stable cardiac silhouette. Electronically Signed   By: Lowella Grip III M.D.   On: 04/17/2018 14:43    (Echo, Carotid, EGD, Colonoscopy, ERCP)    Subjective:   Discharge Exam: Vitals:   05/05/18 1050 05/05/18 1125  BP: (!) 161/91 (!) 161/84  Pulse: 86 86  Resp: 18 18  Temp: 97.8 F (36.6 C) 98.2 F (36.8 C)  SpO2: 100% 100%   Vitals:   05/04/18 2225 05/05/18 0519 05/05/18 1050 05/05/18 1125  BP: 129/82 127/73 (!) 161/91 (!) 161/84  Pulse: 90 85 86 86  Resp: 14 20 18 18   Temp: 97.7 F (36.5 C) 97.9 F (36.6 C) 97.8 F (36.6 C) 98.2 F (36.8 C)  TempSrc: Oral Oral Oral Oral  SpO2: 98% 100% 100% 100%  Weight:  73.5 kg (162 lb)    Height:        General: Pt is alert, awake, not in acute distress Cardiovascular: RRR, S1/S2 +, no rubs, no gallops Respiratory: CTA bilaterally, no wheezing, no rhonchi Abdominal: Soft, NT, ND, bowel sounds + Extremities: no edema, no  cyanosis    The results of significant diagnostics from this hospitalization (including imaging, microbiology, ancillary and laboratory) are listed below for reference.     Microbiology: No results found for this or any previous visit (from the past 240 hour(s)).   Labs: BNP (last 3 results) Recent Labs    02/20/18 1318 05/04/18 0237  BNP 83.0 52.7   Basic Metabolic Panel: Recent Labs  Lab 05/03/18 0053 05/03/18 2309 05/05/18 0414  NA 136 134* 140  K 4.3 4.2 4.5  CL 101 101 112*  CO2 23 23 21*  GLUCOSE 301* 506* 146*  BUN 25* 28* 12  CREATININE 1.94* 2.00* 1.19  CALCIUM 9.1 9.0 8.8*   Liver Function Tests: Recent Labs  Lab  05/05/18 0414  AST 13*  ALT 10  ALKPHOS 69  BILITOT 0.5  PROT 6.0*  ALBUMIN 3.0*   No results for input(s): LIPASE, AMYLASE in the last 168 hours. No results for input(s): AMMONIA in the last 168 hours. CBC: Recent Labs  Lab 05/03/18 0053 05/03/18 2309 05/05/18 0414  WBC 5.6 4.1 4.4  HGB 11.6* 10.5* 9.8*  HCT 37.0* 33.1* 30.5*  MCV 84.3 83.4 80.9  PLT 228 205 146*   Cardiac Enzymes: Recent Labs  Lab 05/04/18 0237 05/04/18 0737 05/04/18 1357  TROPONINI <0.03 <0.03 <0.03   BNP: Invalid input(s): POCBNP CBG: Recent Labs  Lab 05/04/18 1150 05/04/18 1749 05/04/18 2228 05/05/18 0631 05/05/18 1126  GLUCAP 208* 154* 177* 129* 165*   D-Dimer Recent Labs    05/04/18 0237  DDIMER 0.88*   Hgb A1c Recent Labs    05/04/18 0237  HGBA1C 9.7*   Lipid Profile Recent Labs    05/04/18 0237  CHOL 187  HDL 44  LDLCALC 110*  TRIG 166*  CHOLHDL 4.3   Thyroid function studies No results for input(s): TSH, T4TOTAL, T3FREE, THYROIDAB in the last 72 hours.  Invalid input(s): FREET3 Anemia work up No results for input(s): VITAMINB12, FOLATE, FERRITIN, TIBC, IRON, RETICCTPCT in the last 72 hours. Urinalysis    Component Value Date/Time   COLORURINE YELLOW 02/20/2018 1733   APPEARANCEUR HAZY (A) 02/20/2018 1733    LABSPEC 1.023 02/20/2018 1733   PHURINE 6.0 02/20/2018 1733   GLUCOSEU 50 (A) 02/20/2018 1733   HGBUR NEGATIVE 02/20/2018 1733   BILIRUBINUR NEGATIVE 02/20/2018 1733   KETONESUR NEGATIVE 02/20/2018 1733   PROTEINUR 100 (A) 02/20/2018 1733   UROBILINOGEN 0.2 08/13/2015 1030   NITRITE NEGATIVE 02/20/2018 1733   LEUKOCYTESUR SMALL (A) 02/20/2018 1733   Sepsis Labs Invalid input(s): PROCALCITONIN,  WBC,  LACTICIDVEN Microbiology No results found for this or any previous visit (from the past 240 hour(s)).   Time coordinating discharge: 35 minutes  SIGNED:   Georgette Shell, MD  [facility] 05/05/2018, 12:51 PM Pager   If 7PM-7AM, please contact night-coverage [email or web address] Password [email or web address]

## 2018-05-05 NOTE — Progress Notes (Signed)
Pt educated and provided with discharge instructions. Pt given 2 bus passes for transport. Pt has all belongings including cell phone. Iv's removed and intact. PT transport via wheelchair to Ed entrance. Jerald Kief

## 2018-05-05 NOTE — Care Management Obs Status (Signed)
[facility] NOTIFICATION   Patient Details  Name: Michael [name] MRN: [id number] Date of Birth: 09-16-1951   Medicare Observation Status Notification Given:  Yes    Bethena Roys, RN 05/05/2018, 3:24 PM

## 2018-05-05 NOTE — Progress Notes (Signed)
   Patient seen yesterday for evaluation of chest pain. Symptoms and work-up not consistent with ACS. Outpatient follow-up has been arranged. No further recommendations from cardiology.  [facility] will sign off.   Medication Recommendations:  Continue home aspirin, statin, hydralazine, imdur, and diltiazem  Other recommendations (labs, testing, etc):  None Follow up as an outpatient:  Patient to follow-up with Dr. Geraldo Pitter in [facility] on 05/25/18 at 11:20am

## 2018-05-06 ENCOUNTER — Emergency Department (HOSPITAL_COMMUNITY)
Admission: EM | Admit: 2018-05-06 | Discharge: 2018-05-07 | Disposition: A | Discharge status: Home / Self Care | Payer: Medicare Other | Attending: Emergency Medicine | Admitting: Emergency Medicine

## 2018-05-06 ENCOUNTER — Other Ambulatory Visit: Payer: Self-pay

## 2018-05-06 ENCOUNTER — Encounter (HOSPITAL_COMMUNITY): Payer: Self-pay | Admitting: Emergency Medicine

## 2018-05-06 ENCOUNTER — Emergency Department (HOSPITAL_COMMUNITY): Payer: Medicare Other

## 2018-05-06 DIAGNOSIS — B20 Human immunodeficiency virus [HIV] disease: Secondary | ICD-10-CM | POA: Diagnosis not present

## 2018-05-06 DIAGNOSIS — N183 Chronic kidney disease, stage 3 (moderate): Secondary | ICD-10-CM | POA: Insufficient documentation

## 2018-05-06 DIAGNOSIS — R072 Precordial pain: Secondary | ICD-10-CM | POA: Diagnosis not present

## 2018-05-06 DIAGNOSIS — I129 Hypertensive chronic kidney disease with stage 1 through stage 4 chronic kidney disease, or unspecified chronic kidney disease: Secondary | ICD-10-CM | POA: Insufficient documentation

## 2018-05-06 DIAGNOSIS — E785 Hyperlipidemia, unspecified: Secondary | ICD-10-CM | POA: Diagnosis not present

## 2018-05-06 DIAGNOSIS — I4891 Unspecified atrial fibrillation: Secondary | ICD-10-CM | POA: Diagnosis not present

## 2018-05-06 DIAGNOSIS — R079 Chest pain, unspecified: Secondary | ICD-10-CM | POA: Diagnosis not present

## 2018-05-06 DIAGNOSIS — Z86718 Personal history of other venous thrombosis and embolism: Secondary | ICD-10-CM | POA: Insufficient documentation

## 2018-05-06 DIAGNOSIS — Z951 Presence of aortocoronary bypass graft: Secondary | ICD-10-CM | POA: Diagnosis not present

## 2018-05-06 DIAGNOSIS — Z79899 Other long term (current) drug therapy: Secondary | ICD-10-CM | POA: Insufficient documentation

## 2018-05-06 DIAGNOSIS — E1122 Type 2 diabetes mellitus with diabetic chronic kidney disease: Secondary | ICD-10-CM | POA: Diagnosis not present

## 2018-05-06 DIAGNOSIS — Z794 Long term (current) use of insulin: Secondary | ICD-10-CM | POA: Insufficient documentation

## 2018-05-06 DIAGNOSIS — Z7982 Long term (current) use of aspirin: Secondary | ICD-10-CM | POA: Diagnosis not present

## 2018-05-06 DIAGNOSIS — J45909 Unspecified asthma, uncomplicated: Secondary | ICD-10-CM | POA: Diagnosis not present

## 2018-05-06 DIAGNOSIS — R0602 Shortness of breath: Secondary | ICD-10-CM | POA: Diagnosis not present

## 2018-05-06 LAB — CBC
HEMATOCRIT: 35.7 % — AB (ref 39.0–52.0)
Hemoglobin: 10.9 g/dL — ABNORMAL LOW (ref 13.0–17.0)
MCH: 26 pg (ref 26.0–34.0)
MCHC: 30.5 g/dL (ref 30.0–36.0)
MCV: 85 fL (ref 78.0–100.0)
PLATELETS: 181 10*3/uL (ref 150–400)
RBC: 4.2 MIL/uL — AB (ref 4.22–5.81)
RDW: 13.9 % (ref 11.5–15.5)
WBC: 4.3 10*3/uL (ref 4.0–10.5)

## 2018-05-06 LAB — I-STAT TROPONIN, ED: Troponin i, poc: 0 ng/mL (ref 0.00–0.08)

## 2018-05-06 LAB — BASIC METABOLIC PANEL
Anion gap: 10 (ref 5–15)
BUN: 15 mg/dL (ref 8–23)
CHLORIDE: 103 mmol/L (ref 98–111)
CO2: 24 mmol/L (ref 22–32)
Calcium: 9.4 mg/dL (ref 8.9–10.3)
Creatinine, Ser: 1.66 mg/dL — ABNORMAL HIGH (ref 0.61–1.24)
GFR calc non Af Amer: 41 mL/min — ABNORMAL LOW (ref >60)
GFR, EST AFRICAN AMERICAN: 47 mL/min — AB (ref >60)
Glucose, Bld: 383 mg/dL — ABNORMAL HIGH (ref 70–99)
POTASSIUM: 4.3 mmol/L (ref 3.5–5.1)
Sodium: 137 mmol/L (ref 135–145)

## 2018-05-06 NOTE — ED Triage Notes (Signed)
Pt reports 8/10 central chest pain and sob that has been going on since CABG done in April. Pt reports N/V. Respirations regular and unlabored, lungs clear in triage. Hx of HTN, pt reports compliancy with medications.

## 2018-05-07 LAB — I-STAT TROPONIN, ED: Troponin i, poc: 0 ng/mL (ref 0.00–0.08)

## 2018-05-07 NOTE — ED Notes (Addendum)
Pt reports chronic and repetitive chest pains. Pt reports being here several times for same. Reports pain is the same as it has been since his CABG on 01/29/18. Pt also reports SOB since the same time.

## 2018-05-07 NOTE — ED Provider Notes (Signed)
[name] Provider Note   CSN: [id number] Arrival date & time: 05/06/18  2246     History   Chief Complaint Chief Complaint  Patient presents with  . Shortness of Breath  . Chest Pain    HPI Carmelo Reidel is a 67 y.o. male.  The history is provided by the patient and medical records.  Shortness of Breath  Associated symptoms include chest pain.  Chest Pain   Associated symptoms include shortness of breath.     67 year old male with history of A. fib, asthma, coronary artery disease, collagen vascular disease, history of crack cocaine use, depression, diabetes, gout, history of PE, hypertension, hyperlipidemia, HIV, presenting to the ED with chest pain.  Patient reports he has had continued chest pain since his CABG in April 2019.  States he feels like a constant, dull ache.  States if you touch his chest it feels very "sore".  Does report a dry cough but denies any fever or chills.  He has not had any diaphoresis, nausea, vomiting, numbness, or weakness.  Patient just admitted to the hospital for chest pain rule out 7/29-7/10--cardiac enzymes were cycled and remained normal, patient also underwent VQ scan that was negative for PE.  Cardiology did not recommend any additional testing, mostly to keep blood pressure under control and follow-up closely with outpatient cardiologist.  Patient states he has not yet made an appointment.  Past Medical History:  Diagnosis Date  . A-fib ([place])   . Asthma    No PFTs, history of childhood asthma  . CAD (coronary artery disease)    a. 06/2013 STEMI/PCI ([facility]): LAD w/ thrombus (treated with BMS), mid 75%, D2 75%; LCX OM2 75%; RCA small, PDA 95%, PLV 95%;  b. 10/2013 Cath/PCI: ISR w/in LAD (Promus DES x 2), borderline OM2 lesion;  c. 01/2014 MV: Intermediate risk, medium-sized distal ant wall infarct w/ very small amt of peri-infarct ischemia. EF 60%.  . Cellulitis 04/2014   left facial  . Chondromalacia  of medial femoral condyle    Left knee MRI 04/28/12: Chondromalacia of the medial femoral condyle with slight peripheral degeneration of the meniscocapsular junction of the medial meniscus; followed by sports medicine  . Collagen vascular disease ([place])   . Crack cocaine use    for 20+ years, has been enrolled in detox programs in the past  . Depression    with history of hospitalization for suicidal ideation  . Diabetes mellitus 2002   Diagnosed in 2002, started insulin in 2012  . Gout   . Gout 04/28/2012  . Headache(784.0)    CT head 08/2011: Periventricular and subcortical white matter hypodensities are most in keeping with chronic microangiopathic change  . HIV infection [facility]) Nov 2012   Followed by [name]  . Hyperlipidemia   . Hypertension   . Pulmonary embolism [facility])     Patient Active Problem List   Diagnosis Date Noted  . Type II diabetes mellitus with renal manifestations ([place]) 05/04/2018  . GERD (gastroesophageal reflux disease) 05/04/2018  . S/P CABG (coronary artery bypass graft)   . Left testicular pain   . Depression 02/20/2018  . Epididymo-orchitis, acute 02/20/2018  . Essential hypertension 02/20/2018  . Precordial chest pain 02/20/2018  . AKI (acute kidney injury) ([place]) 02/14/2018  . HCAP (healthcare-associated pneumonia) 02/14/2018  . Hepatitis C 12/30/2016  . Back pain 04/18/2016  . S/P carotid endarterectomy 11/15/2015  . MDD (major depressive disorder), recurrent severe, without psychosis ([place]) 09/09/2015  .  Cocaine-induced mood disorder ([place]) 08/14/2015  . Cocaine abuse with cocaine-induced mood disorder ([place]) 08/14/2015  . Gout 07/10/2015  . Acute renal failure superimposed on stage 3 chronic kidney disease ([place]) 03/06/2015  . Normocytic anemia 03/06/2015  . Hypoglycemia   . Encounter for general adult medical examination with abnormal findings 02/09/2015  . Cocaine use disorder, severe, dependence ([place]) 12/13/2014  . Substance or  medication-induced depressive disorder with onset during withdrawal ([place]) 12/13/2014  . Severe recurrent major depressive disorder with psychotic features ([place]) 12/12/2014  . Cervicalgia 06/28/2014  . Lumbar radiculopathy, chronic 06/28/2014  . Asthma, chronic 02/03/2014  . 3-vessel CAD 06/24/2013  . ED (erectile dysfunction) of organic origin 07/07/2012  . Hypertension goal BP (blood pressure) < 140/80 04/29/2012  . Chondromalacia of left knee 03/19/2012  . Hyperlipidemia with target LDL less than 100 02/12/2012  . Fibromyalgia 02/12/2012  . HIV (human immunodeficiency virus infection) ([place]) 08/27/2011  . Uncontrolled type 2 diabetes with neuropathy ([place]) 10/17/2000    Past Surgical History:  Procedure Laterality Date  . BACK SURGERY     1988  . BOWEL RESECTION    . CARDIAC SURGERY    . CERVICAL SPINE SURGERY     " rods in my neck "  . CORONARY ARTERY BYPASS GRAFT    . CORONARY STENT PLACEMENT    . NM MYOCAR PERF WALL MOTION  12/27/2011   normal  . SPINE SURGERY          Home Medications    Prior to Admission medications   Medication Sig Start Date End Date Taking? Authorizing Provider  ACCU-CHEK SOFTCLIX LANCETS lancets USE TO CHECK BLOOD GLUCOSE LEVELS UP TO 4 TIMES DAILY AS DIRECTED 11/24/17   Lindell Spar I, NP  albuterol (PROAIR HFA) 108 (90 Base) MCG/ACT inhaler INHALE TWO PUFFS INTO THE LUNGS EVERY 6 (SIX) HOURS AS NEEDED FOR WHEEZING OR SHORTNESS OF BREATH. Patient not taking: Reported on 05/03/2018 11/24/17   Lindell Spar I, NP  allopurinol (ZYLOPRIM) 300 MG tablet Take 1 tablet (300 mg total) by mouth daily. For gout. 11/25/17   Lindell Spar I, NP  amantadine (SYMMETREL) 100 MG capsule Take 100 mg by mouth daily. 02/14/18   [provider]  aspirin 81 MG chewable tablet Chew 81 mg by mouth daily.    [provider]  colchicine 0.6 MG tablet Take 1 tablet (0.6 mg total) by mouth daily. 02/27/18   Johnsie Cancel, NP  diltiazem (CARDIZEM CD) 120 MG 24  hr capsule Take 1 capsule (120 mg total) by mouth daily. For high blood pressure 11/25/17   Lindell Spar I, NP  emtricitabine-rilpivir-tenofovir AF (ODEFSEY) 200-25-25 MG TABS tablet Take 1 tablet by mouth daily with breakfast. For HIV infection 11/24/17   Lindell Spar I, NP  famotidine (PEPCID) 20 MG tablet Take 1 tablet (20 mg total) by mouth daily. For acid reflux 11/24/17   Lindell Spar I, NP  gabapentin (NEURONTIN) 400 MG capsule Take 1 capsule (400 mg total) by mouth 2 (two) times daily. For agitation/neuropathic pain Patient taking differently: Take 400 mg by mouth 2 (two) times daily as needed (For agitation/neuropathic pain).  11/24/17   Lindell Spar I, NP  hydrOXYzine (ATARAX/VISTARIL) 25 MG tablet Take 1 tablet (25 mg total) by mouth 3 (three) times daily as needed for anxiety. 11/24/17   Lindell Spar I, NP  insulin glargine (LANTUS) 100 UNIT/ML injection Inject 0.3 mLs (30 Units total) into the skin at bedtime. For diabetes management 11/24/17  Lindell Spar I, NP  insulin lispro (HUMALOG) 100 UNIT/ML injection Inject 0.05 mLs (5 Units total) into the skin 3 (three) times daily with meals. For diabetes management Patient taking differently: Inject 4 Units into the skin 3 (three) times daily with meals. For diabetes management 11/24/17   Lindell Spar I, NP  Insulin Syringe-Needle U-100 (B-D INS SYR MICROFINE 1CC/27G) 27G X 5/8" 1 ML MISC 1 strip by Does not apply route 3 (three) times daily as needed. For blood sugar checks 11/24/17   Lindell Spar I, NP  isosorbide mononitrate (IMDUR) 30 MG 24 hr tablet Take 2 tablets (60 mg total) by mouth daily. For chest 02/26/18   Johnsie Cancel, NP  pravastatin (PRAVACHOL) 40 MG tablet Take 1 tablet (40 mg total) by mouth at bedtime. For high cholesterol 11/24/17   [place], Herbert Pun I, NP  PROAIR HFA 108 (90 Base) MCG/ACT inhaler INHALE TWO PUFFS INTO THE LUNGS EVERY 6 (SIX) HOURS AS NEEDED FOR WHEEZING OR SHORTNESS OF BREATH. 02/24/18   [name], MD    risperiDONE (RISPERDAL) 0.5 MG tablet Take 0.5 mg by mouth daily. 06/16/17   [provider]  sertraline (ZOLOFT) 50 MG tablet Take 1 tablet (50 mg total) by mouth daily. For depression 11/25/17   Lindell Spar I, NP  traZODone (DESYREL) 50 MG tablet Take 1 tablet (50 mg total) by mouth at bedtime as needed for sleep. 11/24/17   Encarnacion Slates, NP    Family History Family History  Problem Relation Age of Onset  . Diabetes Mother   . Hypertension Mother   . Hyperlipidemia Mother   . Diabetes Father   . Cancer Father   . Hypertension Father   . Diabetes Brother   . Heart disease Brother   . Diabetes Sister   . Colon cancer Neg Hx     Social History Social History   Tobacco Use  . Smoking status: Never Smoker  . Smokeless tobacco: Never Used  Substance Use Topics  . Alcohol use: No    Alcohol/week: 2.4 oz    Types: 2 Cans of beer, 2 Shots of liquor per week  . Drug use: Yes    Types: "Crack" cocaine, Cocaine    Comment: last use January 2018     Allergies   Patient has no known allergies.   Review of Systems Review of Systems  Respiratory: Positive for shortness of breath.   Cardiovascular: Positive for chest pain.  All other systems reviewed and are negative.    Physical Exam Updated Vital Signs BP (!) 175/106   Pulse 85   Temp (!) 97.5 F (36.4 C) (Oral)   Resp 14   Ht 5\' 8"  (1.727 m)   Wt 73.5 kg (162 lb)   SpO2 100%   BMI 24.63 kg/m   Physical Exam  Constitutional: He is oriented to person, place, and time. He appears well-developed and well-nourished.  HENT:  Head: Normocephalic and atraumatic.  Mouth/Throat: Oropharynx is clear and moist.  Eyes: Pupils are equal, round, and reactive to light. Conjunctivae and EOM are normal.  Neck: Normal range of motion.  Cardiovascular: Normal rate, regular rhythm and normal heart sounds.  Pulmonary/Chest: Effort normal and breath sounds normal. He has no decreased breath sounds. He has no wheezes. He  has no rales.  Midline sternotomy scar present; chest pain is reproducible with palpation; no overlying skin changes or warmth to touch, no signs of infection  Abdominal: Soft. Bowel sounds are normal.  Musculoskeletal:  Normal range of motion.  Neurological: He is alert and oriented to person, place, and time.  Skin: Skin is warm and dry.  Psychiatric: He has a normal mood and affect.  Nursing note and vitals reviewed.    ED Treatments / Results  Labs (all labs ordered are listed, but only abnormal results are displayed) Labs Reviewed  BASIC METABOLIC PANEL - Abnormal; Notable for the following components:      Result Value   Glucose, Bld 383 (*)    Creatinine, Ser 1.66 (*)    GFR calc non Af Amer 41 (*)    GFR calc Af Amer 47 (*)    All other components within normal limits  CBC - Abnormal; Notable for the following components:   RBC 4.20 (*)    Hemoglobin 10.9 (*)    HCT 35.7 (*)    All other components within normal limits  I-STAT TROPONIN, ED  I-STAT TROPONIN, ED    EKG EKG Interpretation  Date/Time:  Wednesday May 06 2018 22:56:28 EDT Ventricular Rate:  91 PR Interval:  146 QRS Duration: 76 QT Interval:  344 QTC Calculation: 423 R Axis:   56 Text Interpretation:  Normal sinus rhythm with sinus arrhythmia Nonspecific T wave abnormality Abnormal ECG No acute changes No significant change since last tracing Confirmed by Varney Biles [phone]) on 05/07/2018 4:07:59 AM   Radiology Dg Chest 2 View  Result Date: 05/06/2018 CLINICAL DATA:  Chest pain and shortness of breath. EXAM: CHEST - 2 VIEW COMPARISON:  Most recent chest radiograph 05/03/2018, most recent chest CT 04/11/2018 FINDINGS: Post median sternotomy. Left atrial clipping and coronary stent visualized. Unchanged heart size and mediastinal contours. No pulmonary edema, pleural effusion, pneumothorax or focal airspace disease. Again seen elevation of right hemidiaphragm. IMPRESSION: No acute abnormality.  Stable  postsurgical change. Electronically Signed   By: Jeb Levering M.D.   On: 05/06/2018 23:42    Procedures Procedures (including critical care time)  Medications Ordered in ED Medications - No data to display   Initial Impression / Assessment and Plan / ED Course  I have reviewed the triage vital signs and the nursing notes.  Pertinent labs & imaging results that were available during my care of the patient were reviewed by me and considered in my medical decision making (see chart for details).  67 year old male presenting to the ED with chest pain.  Has had ongoing pain since CABG in April.  He was seen in the ED on 05/04/2018 for same and admitted for cardiac rule out.  Serial troponins were all negative.  VQ scan was also negative during that admission.  Pain today sounds consistent with prior based on chart review.  EKG unchanged.  Initial labs overall reassuring.  Chest x-ray is clear.  Given his cardiac history, will obtain delta troponin.  Delta troponin remains negative.  Patient's vitals remained stable.  As he just had a cardiac rule out, I do not feel he needs to be readmitted.  He will be discharged home with instructions to follow-up with his outpatient cardiologist.  He will return here for any new/acute changes.  Final Clinical Impressions(s) / ED Diagnoses   Final diagnoses:  Chest pain, unspecified type    ED Discharge Orders    None       Larene Pickett, PA-C 05/07/18 [id number]    Varney Biles, MD 05/07/18 [phone]

## 2018-05-07 NOTE — Discharge Instructions (Signed)
Continue your home medications. Follow-up with your primary care doctor. Return to the ED for new or worsening symptoms.

## 2018-05-25 ENCOUNTER — Ambulatory Visit: Payer: Medicare Other | Admitting: Cardiology

## 2018-05-26 ENCOUNTER — Encounter: Payer: Self-pay | Admitting: Cardiology

## 2018-06-08 ENCOUNTER — Other Ambulatory Visit: Payer: Self-pay

## 2018-06-08 ENCOUNTER — Emergency Department (HOSPITAL_COMMUNITY)
Admission: EM | Admit: 2018-06-08 | Discharge: 2018-06-09 | Disposition: A | Discharge status: Home / Self Care | Payer: Medicare Other | Attending: Neurology | Admitting: Neurology

## 2018-06-08 ENCOUNTER — Emergency Department (HOSPITAL_COMMUNITY): Payer: Medicare Other

## 2018-06-08 ENCOUNTER — Encounter (HOSPITAL_COMMUNITY): Payer: Self-pay

## 2018-06-08 DIAGNOSIS — I2510 Atherosclerotic heart disease of native coronary artery without angina pectoris: Secondary | ICD-10-CM | POA: Diagnosis not present

## 2018-06-08 DIAGNOSIS — I480 Paroxysmal atrial fibrillation: Secondary | ICD-10-CM | POA: Insufficient documentation

## 2018-06-08 DIAGNOSIS — I509 Heart failure, unspecified: Secondary | ICD-10-CM | POA: Diagnosis not present

## 2018-06-08 DIAGNOSIS — I130 Hypertensive heart and chronic kidney disease with heart failure and stage 1 through stage 4 chronic kidney disease, or unspecified chronic kidney disease: Secondary | ICD-10-CM | POA: Insufficient documentation

## 2018-06-08 DIAGNOSIS — Z79899 Other long term (current) drug therapy: Secondary | ICD-10-CM | POA: Insufficient documentation

## 2018-06-08 DIAGNOSIS — Z951 Presence of aortocoronary bypass graft: Secondary | ICD-10-CM | POA: Diagnosis not present

## 2018-06-08 DIAGNOSIS — N183 Chronic kidney disease, stage 3 (moderate): Secondary | ICD-10-CM | POA: Diagnosis not present

## 2018-06-08 DIAGNOSIS — E1122 Type 2 diabetes mellitus with diabetic chronic kidney disease: Secondary | ICD-10-CM | POA: Insufficient documentation

## 2018-06-08 DIAGNOSIS — Z794 Long term (current) use of insulin: Secondary | ICD-10-CM | POA: Diagnosis not present

## 2018-06-08 DIAGNOSIS — J4521 Mild intermittent asthma with (acute) exacerbation: Secondary | ICD-10-CM

## 2018-06-08 DIAGNOSIS — B20 Human immunodeficiency virus [HIV] disease: Secondary | ICD-10-CM | POA: Insufficient documentation

## 2018-06-08 DIAGNOSIS — R079 Chest pain, unspecified: Secondary | ICD-10-CM | POA: Diagnosis not present

## 2018-06-08 DIAGNOSIS — E785 Hyperlipidemia, unspecified: Secondary | ICD-10-CM | POA: Diagnosis not present

## 2018-06-08 DIAGNOSIS — R059 Cough, unspecified: Secondary | ICD-10-CM

## 2018-06-08 DIAGNOSIS — Z86718 Personal history of other venous thrombosis and embolism: Secondary | ICD-10-CM | POA: Diagnosis not present

## 2018-06-08 DIAGNOSIS — R05 Cough: Secondary | ICD-10-CM

## 2018-06-08 LAB — CBC WITH DIFFERENTIAL/PLATELET
BASOS ABS: 0 10*3/uL (ref 0.0–0.1)
BASOS PCT: 1 %
EOS PCT: 4 %
Eosinophils Absolute: 0.2 10*3/uL (ref 0.0–0.7)
HCT: 29.5 % — ABNORMAL LOW (ref 39.0–52.0)
Hemoglobin: 9.7 g/dL — ABNORMAL LOW (ref 13.0–17.0)
Lymphocytes Relative: 33 %
Lymphs Abs: 1.4 10*3/uL (ref 0.7–4.0)
MCH: 26.7 pg (ref 26.0–34.0)
MCHC: 32.9 g/dL (ref 30.0–36.0)
MCV: 81.3 fL (ref 78.0–100.0)
MONO ABS: 0.4 10*3/uL (ref 0.1–1.0)
Monocytes Relative: 8 %
Neutro Abs: 2.2 10*3/uL (ref 1.7–7.7)
Neutrophils Relative %: 54 %
PLATELETS: 177 10*3/uL (ref 150–400)
RBC: 3.63 MIL/uL — ABNORMAL LOW (ref 4.22–5.81)
RDW: 13.6 % (ref 11.5–15.5)
WBC: 4.2 10*3/uL (ref 4.0–10.5)

## 2018-06-08 LAB — BASIC METABOLIC PANEL
ANION GAP: 9 (ref 5–15)
BUN: 21 mg/dL (ref 8–23)
CALCIUM: 8.9 mg/dL (ref 8.9–10.3)
CO2: 25 mmol/L (ref 22–32)
Chloride: 107 mmol/L (ref 98–111)
Creatinine, Ser: 1.26 mg/dL — ABNORMAL HIGH (ref 0.61–1.24)
GFR, EST NON AFRICAN AMERICAN: 57 mL/min — AB (ref >60)
GLUCOSE: 337 mg/dL — AB (ref 70–99)
Potassium: 3.9 mmol/L (ref 3.5–5.1)
Sodium: 141 mmol/L (ref 135–145)

## 2018-06-08 LAB — I-STAT TROPONIN, ED: Troponin i, poc: 0.01 ng/mL (ref 0.00–0.08)

## 2018-06-08 MED ORDER — IPRATROPIUM-ALBUTEROL 0.5-2.5 (3) MG/3ML IN SOLN
3.0000 mL | Freq: Once | RESPIRATORY_TRACT | Status: AC
  Administered 2018-06-08: 3 mL via RESPIRATORY_TRACT
  Filled 2018-06-08: qty 3

## 2018-06-08 NOTE — ED Triage Notes (Signed)
Pt presents to ED from home for cough and chest pain. Pt reports that he has had a cough for 3 days, and now is having chest pain. Pt reports hx of CABG x3 in April. Pt also reports fatigue.

## 2018-06-08 NOTE — ED Provider Notes (Signed)
[facility] DEPT Provider Note   CSN: [id number] Arrival date & time: 06/08/18  2147     History   Chief Complaint Chief Complaint  Patient presents with  . Chest Pain  . Cough    HPI Michael [name] is a 67 y.o. male with history of CAD status post CABG, hyperlipidemia, diabetes on insulin, HTN, asthma, previous crack cocaine use, renal insufficiency, PE, HIV CD4 count 630 is here for evaluation of sudden onset, worsening, persistent cough onset 3 days ago, associated with small amount of phlegm production, chest congestion, sweats, nasal congestion and slight tickle in his throat that worsens the cough.  Reports he has had constant, nonpleuritic nonexertional chest pain and shortness of breath since bypass surgery in April that is described as achy.  Persistent coughing has made his chest discomfort worse.  Denies associated fevers, nausea, vomiting, diarrhea, constipation, abdominal pain.  No tobacco use.  Last crack cocaine use March 2019.  Uses inhalers for asthma but these did not provide relief today.  No sick contacts.  No other interventions for this.  No alleviating or aggravating factors.  Of note, pt has documented h/o a-fib on admission 05/17 however pt was not aware of this diagnosis.  He is not on any anticoagulant.  Other notes after this admission do not mention a-fib or AC.   HPI  Past Medical History:  Diagnosis Date  . A-fib ([place])   . Asthma    No PFTs, history of childhood asthma  . CAD (coronary artery disease)    a. 06/2013 STEMI/PCI ([facility]): LAD w/ thrombus (treated with BMS), mid 75%, D2 75%; LCX OM2 75%; RCA small, PDA 95%, PLV 95%;  b. 10/2013 Cath/PCI: ISR w/in LAD (Promus DES x 2), borderline OM2 lesion;  c. 01/2014 MV: Intermediate risk, medium-sized distal ant wall infarct w/ very small amt of peri-infarct ischemia. EF 60%.  . Cellulitis 04/2014   left facial  . Chondromalacia of medial femoral condyle    Left knee MRI  04/28/12: Chondromalacia of the medial femoral condyle with slight peripheral degeneration of the meniscocapsular junction of the medial meniscus; followed by sports medicine  . Collagen vascular disease ([place])   . Crack cocaine use    for 20+ years, has been enrolled in detox programs in the past  . Depression    with history of hospitalization for suicidal ideation  . Diabetes mellitus 2002   Diagnosed in 2002, started insulin in 2012  . Gout   . Gout 04/28/2012  . Headache(784.0)    CT head 08/2011: Periventricular and subcortical white matter hypodensities are most in keeping with chronic microangiopathic change  . HIV infection [facility]) Nov 2012   Followed by [name]  . Hyperlipidemia   . Hypertension   . Pulmonary embolism [facility])     Patient Active Problem List   Diagnosis Date Noted  . Type II diabetes mellitus with renal manifestations ([place]) 05/04/2018  . GERD (gastroesophageal reflux disease) 05/04/2018  . S/P CABG (coronary artery bypass graft)   . Left testicular pain   . Depression 02/20/2018  . Epididymo-orchitis, acute 02/20/2018  . Essential hypertension 02/20/2018  . Precordial chest pain 02/20/2018  . AKI (acute kidney injury) ([place]) 02/14/2018  . HCAP (healthcare-associated pneumonia) 02/14/2018  . History of pulmonary embolism 07/04/2017  . Acute hyponatremia 05/11/2017  . Hyperglycemia due to type 2 diabetes mellitus ([place]) 05/11/2017  . CHF (congestive heart failure) ([place]) 04/01/2017  . Hepatitis C 12/30/2016  . Chronic  ischemic heart disease 11/12/2016  . Paroxysmal A-fib ([place]) 11/12/2016  . Back pain 04/18/2016  . S/P carotid endarterectomy 11/15/2015  . MDD (major depressive disorder), recurrent severe, without psychosis ([place]) 09/09/2015  . Cocaine-induced mood disorder ([place]) 08/14/2015  . Cocaine abuse with cocaine-induced mood disorder ([place]) 08/14/2015  . Gout 07/10/2015  . Acute renal failure superimposed on stage 3 chronic kidney disease ([place])  03/06/2015  . Normocytic anemia 03/06/2015  . Chronic kidney disease, stage III (moderate) ([place]) 03/06/2015  . Hypoglycemia   . Encounter for general adult medical examination with abnormal findings 02/09/2015  . Cocaine use disorder, severe, dependence ([place]) 12/13/2014  . Substance or medication-induced depressive disorder with onset during withdrawal ([place]) 12/13/2014  . Severe recurrent major depressive disorder with psychotic features ([place]) 12/12/2014  . Cervicalgia 06/28/2014  . Lumbar radiculopathy, chronic 06/28/2014  . Asthma, chronic 02/03/2014  . 3-vessel CAD 06/24/2013  . ED (erectile dysfunction) of organic origin 07/07/2012  . Hypertension goal BP (blood pressure) < 140/80 04/29/2012  . Chondromalacia of left knee 03/19/2012  . Hyperlipidemia with target LDL less than 100 02/12/2012  . Fibromyalgia 02/12/2012  . HIV (human immunodeficiency virus infection) ([place]) 08/27/2011  . Uncontrolled type 2 diabetes with neuropathy ([place]) 10/17/2000    Past Surgical History:  Procedure Laterality Date  . BACK SURGERY     1988  . BOWEL RESECTION    . CARDIAC SURGERY    . CERVICAL SPINE SURGERY     " rods in my neck "  . CORONARY ARTERY BYPASS GRAFT    . CORONARY STENT PLACEMENT    . NM MYOCAR PERF WALL MOTION  12/27/2011   normal  . SPINE SURGERY          Home Medications    Prior to Admission medications   Medication Sig Start Date End Date Taking? Authorizing Provider  ACCU-CHEK SOFTCLIX LANCETS lancets USE TO CHECK BLOOD GLUCOSE LEVELS UP TO 4 TIMES DAILY AS DIRECTED 11/24/17   Lindell Spar I, NP  albuterol (PROAIR HFA) 108 (90 Base) MCG/ACT inhaler INHALE TWO PUFFS INTO THE LUNGS EVERY 6 (SIX) HOURS AS NEEDED FOR WHEEZING OR SHORTNESS OF BREATH. Patient not taking: Reported on 05/03/2018 11/24/17   Lindell Spar I, NP  allopurinol (ZYLOPRIM) 300 MG tablet Take 1 tablet (300 mg total) by mouth daily. For gout. 11/25/17   Lindell Spar I, NP  amantadine (SYMMETREL) 100 MG  capsule Take 100 mg by mouth daily. 02/14/18   [provider]  aspirin 81 MG chewable tablet Chew 81 mg by mouth daily.    [provider]  benzonatate (TESSALON PERLES) 100 MG capsule Take 1 capsule (100 mg total) by mouth 3 (three) times daily as needed for cough. FOR DISRUPTIVE DAY TIME COUGH 06/09/18   Kinnie Feil, PA-C  colchicine 0.6 MG tablet Take 1 tablet (0.6 mg total) by mouth daily. 02/27/18   Johnsie Cancel, NP  diltiazem (CARDIZEM CD) 120 MG 24 hr capsule Take 1 capsule (120 mg total) by mouth daily. For high blood pressure 11/25/17   Lindell Spar I, NP  emtricitabine-rilpivir-tenofovir AF (ODEFSEY) 200-25-25 MG TABS tablet Take 1 tablet by mouth daily with breakfast. For HIV infection 11/24/17   Lindell Spar I, NP  famotidine (PEPCID) 20 MG tablet Take 1 tablet (20 mg total) by mouth daily. For acid reflux 11/24/17   Lindell Spar I, NP  gabapentin (NEURONTIN) 400 MG capsule Take 1 capsule (400 mg total) by mouth 2 (two) times daily. For  agitation/neuropathic pain Patient taking differently: Take 400 mg by mouth 2 (two) times daily as needed (For agitation/neuropathic pain).  11/24/17   Lindell Spar I, NP  hydrOXYzine (ATARAX/VISTARIL) 25 MG tablet Take 1 tablet (25 mg total) by mouth 3 (three) times daily as needed for anxiety. 11/24/17   Lindell Spar I, NP  insulin glargine (LANTUS) 100 UNIT/ML injection Inject 0.3 mLs (30 Units total) into the skin at bedtime. For diabetes management 11/24/17   Lindell Spar I, NP  insulin lispro (HUMALOG) 100 UNIT/ML injection Inject 0.05 mLs (5 Units total) into the skin 3 (three) times daily with meals. For diabetes management Patient taking differently: Inject 4 Units into the skin 3 (three) times daily with meals. For diabetes management 11/24/17   Lindell Spar I, NP  Insulin Syringe-Needle U-100 (B-D INS SYR MICROFINE 1CC/27G) 27G X 5/8" 1 ML MISC 1 strip by Does not apply route 3 (three) times daily as needed. For blood sugar  checks 11/24/17   Lindell Spar I, NP  isosorbide mononitrate (IMDUR) 30 MG 24 hr tablet Take 2 tablets (60 mg total) by mouth daily. For chest 02/26/18   Johnsie Cancel, NP  pravastatin (PRAVACHOL) 40 MG tablet Take 1 tablet (40 mg total) by mouth at bedtime. For high cholesterol 11/24/17   Nwoko, Herbert Pun I, NP  predniSONE (STERAPRED UNI-PAK 21 TAB) 10 MG (21) TBPK tablet Take by mouth daily. Take 6 tabs by mouth daily  for 2 days, then 5 tabs for 2 days, then 4 tabs for 2 days, then 3 tabs for 2 days, 2 tabs for 2 days, then 1 tab by mouth daily for 2 days 06/09/18   Kinnie Feil, PA-C  PROAIR HFA 108 (90 Base) MCG/ACT inhaler INHALE TWO PUFFS INTO THE LUNGS EVERY 6 (SIX) HOURS AS NEEDED FOR WHEEZING OR SHORTNESS OF BREATH. 02/24/18   [name], MD  risperiDONE (RISPERDAL) 0.5 MG tablet Take 0.5 mg by mouth daily. 06/16/17   [provider]  sertraline (ZOLOFT) 50 MG tablet Take 1 tablet (50 mg total) by mouth daily. For depression 11/25/17   Lindell Spar I, NP  traZODone (DESYREL) 50 MG tablet Take 1 tablet (50 mg total) by mouth at bedtime as needed for sleep. 11/24/17   Encarnacion Slates, NP    Family History Family History  Problem Relation Age of Onset  . Diabetes Mother   . Hypertension Mother   . Hyperlipidemia Mother   . Diabetes Father   . Cancer Father   . Hypertension Father   . Diabetes Brother   . Heart disease Brother   . Diabetes Sister   . Colon cancer Neg Hx     Social History Social History   Tobacco Use  . Smoking status: Never Smoker  . Smokeless tobacco: Never Used  Substance Use Topics  . Alcohol use: No    Alcohol/week: 4.0 standard drinks    Types: 2 Cans of beer, 2 Shots of liquor per week  . Drug use: Yes    Types: "Crack" cocaine, Cocaine    Comment: last use January 2018     Allergies   Patient has no known allergies.   Review of Systems Review of Systems  Constitutional: Positive for diaphoresis.  HENT: Positive for congestion  and sore throat.   Respiratory: Positive for cough and shortness of breath (chronic since april 2019).   Cardiovascular: Positive for chest pain (chronic since april 2019).  Allergic/Immunologic: Positive for immunocompromised state (HIV CD4 630).  All other systems reviewed and are negative.    Physical Exam Updated Vital Signs BP 140/89 (BP Location: Right Arm)   Pulse 96   Temp 98 F (36.7 C) (Oral)   Resp 18   Ht 5\' 7"  (1.702 m)   Wt 75.8 kg   SpO2 96%   BMI 26.16 kg/m   Physical Exam  Constitutional: He is oriented to person, place, and time. He appears well-developed and well-nourished.  Nontoxic.  HENT:  Head: Normocephalic and atraumatic.  Right Ear: External ear normal.  Left Ear: External ear normal.  Nose: Nose normal.  Oropharynx and tonsils are erythematous.  Exudate noted to the left tonsil.  Uvula is midline.  Moist mucous membranes.   Moderate mucosal edema.  No significant rhinorrhea noted.  Eyes: Conjunctivae and EOM are normal. No scleral icterus.  Neck: Normal range of motion. Neck supple.  Cardiovascular: Normal rate, regular rhythm, normal heart sounds and intact distal pulses.  Pulmonary/Chest: Effort normal. He has decreased breath sounds in the right lower field.  Normal work of breathing.  Speaking in full sentences.  Frequent, dry cough during exam.  Diminished breath sounds to right lower lobe anteriorly and posteriorly.  No wheezing, crackles.  Musculoskeletal: Normal range of motion. He exhibits no deformity.  Neurological: He is alert and oriented to person, place, and time.  Skin: Skin is warm and dry. Capillary refill takes less than 2 seconds.  Psychiatric: He has a normal mood and affect. His behavior is normal. Judgment and thought content normal.  Nursing note and vitals reviewed.    ED Treatments / Results  Labs (all labs ordered are listed, but only abnormal results are displayed) Labs Reviewed  CBC WITH DIFFERENTIAL/PLATELET -  Abnormal; Notable for the following components:      Result Value   RBC 3.63 (*)    Hemoglobin 9.7 (*)    HCT 29.5 (*)    All other components within normal limits  BASIC METABOLIC PANEL - Abnormal; Notable for the following components:   Glucose, Bld 337 (*)    Creatinine, Ser 1.26 (*)    GFR calc non Af Amer 57 (*)    All other components within normal limits  GROUP A STREP BY PCR  I-STAT TROPONIN, ED    EKG EKG Interpretation  Date/Time:  Monday June 08 2018 22:11:19 EDT Ventricular Rate:  95 PR Interval:    QRS Duration: 70 QT Interval:  337 QTC Calculation: 424 R Axis:   49 Text Interpretation:  Sinus rhythm Probable left atrial enlargement Borderline T wave abnormalities When compared to prior, no significant changes seen.  No STEMI Confirmed by Antony Blackbird [phone]) on 06/08/2018 11:00:13 PM   Radiology Dg Chest 2 View  Result Date: 06/08/2018 CLINICAL DATA:  Cough with sweats EXAM: CHEST - 2 VIEW COMPARISON:  05/06/2018 CXR FINDINGS: The heart size and mediastinal contours are within normal limits. Coronary arterial stent projects over the right heart. Both lungs are clear. ACDF hardware of the included lower cervical spine. Median sternotomy sutures are in place with left atrial appendage clipping. Elevated right hemidiaphragm is stable. The visualized skeletal structures are unremarkable. IMPRESSION: No active cardiopulmonary disease. Electronically Signed   By: Ashley Royalty M.D.   On: 06/08/2018 22:58    Procedures Procedures (including critical care time)  Medications Ordered in ED Medications  ipratropium-albuterol (DUONEB) 0.5-2.5 (3) MG/3ML nebulizer solution 3 mL (3 mLs Nebulization Given 06/08/18 2232)     Initial Impression / Assessment and Plan /  ED Course  I have reviewed the triage vital signs and the nursing notes.  Pertinent labs & imaging results that were available during my care of the patient were reviewed by me and considered in my medical  decision making (see chart for details).    Considering viral syndrome vs PNA vs asthma exacerbation.  Pt reports non exertional, non pleuritic, constant CP and SOB since bypass surgery April 2019. Pt has been seen for this in ER before.  Although pt is a cardiac pt, this Cp/SOB does not sound like cardiac ischemia.  He has h/o PE however CP is non pleuritic making PE less likely. He is also not tachycardic, tachypnic, hypoxic.  Will obtain CXR, EKG, trop and screening labs. Duoneb ordered, will reassess.   0120: Pt ambulated in ED with normal O2 sats and improved symptoms. CXR normal  Trop negative. Baseline anemia and creatinine.  Likely viral URI in setting of asthma exacerbation. Will tx symptoms conservatively with albuterol and prednisone. ED return precautions given. Patient is aware that a viral URI infection may precede the onset of pneumonia. Patient is aware of s/s that would warrant return to ED for further reevaluation. Shared visit with physician.   Final Clinical Impressions(s) / ED Diagnoses   Final diagnoses:  Mild intermittent asthma with exacerbation  Cough    ED Discharge Orders         Ordered    predniSONE (STERAPRED UNI-PAK 21 TAB) 10 MG (21) TBPK tablet  Daily     06/09/18 0100    benzonatate (TESSALON PERLES) 100 MG capsule  3 times daily PRN     06/09/18 0100           Kinnie Feil, PA-C 06/09/18 0124    Tegeler, [name], MD 06/09/18 [phone]

## 2018-06-09 ENCOUNTER — Other Ambulatory Visit: Payer: Self-pay | Admitting: Infectious Diseases

## 2018-06-09 DIAGNOSIS — J45909 Unspecified asthma, uncomplicated: Secondary | ICD-10-CM

## 2018-06-09 LAB — GROUP A STREP BY PCR: Group A Strep by PCR: NOT DETECTED

## 2018-06-09 MED ORDER — PREDNISONE 10 MG (21) PO TBPK: ORAL_TABLET | Freq: Every day | ORAL | 0 refills | Status: DC

## 2018-06-09 MED ORDER — BENZONATATE 100 MG PO CAPS: 100.0000 mg | ORAL_CAPSULE | Freq: Three times a day (TID) | ORAL | 0 refills | Status: DC | PRN

## 2018-06-09 NOTE — ED Notes (Signed)
Ambulatory SPO2 99%. Pt tolerated well.

## 2018-06-09 NOTE — Discharge Instructions (Addendum)
Lab work looks good today.  Cough is likely from a virus that has lead to an asthma flare.   We will treat this with prednisone tape and scheduled albuterol inhaler ever 4-6 hours.  Take tessalon perles to help with cough.   Return to ER for worsening symptoms, fever greater than 100.27F, chills, chest pain or shortness of breath on exertion or activity.

## 2018-06-09 NOTE — ED Notes (Signed)
Discharge instructions reviewed with pt. Pt verbalized understanding. Pt to follow up with PCP. Pt to stay in room until bus starts up in the morning.

## 2018-06-18 ENCOUNTER — Ambulatory Visit: Payer: Medicare Other | Admitting: Family Medicine

## 2018-08-04 ENCOUNTER — Encounter: Payer: Self-pay | Admitting: Critical Care Medicine

## 2018-08-04 ENCOUNTER — Telehealth: Payer: Self-pay

## 2018-08-04 LAB — GLUCOSE, POCT (MANUAL RESULT ENTRY): POC GLUCOSE: 261 mg/dL — AB (ref 70–99)

## 2018-08-04 MED ORDER — DILTIAZEM HCL ER COATED BEADS 120 MG PO CP24: 120.0000 mg | ORAL_CAPSULE | Freq: Every day | ORAL | 0 refills | Status: DC

## 2018-08-04 MED ORDER — INSULIN GLARGINE 100 UNIT/ML ~~LOC~~ SOLN: 30.0000 [IU] | Freq: Every day | SUBCUTANEOUS | 0 refills | Status: DC

## 2018-08-04 MED ORDER — METFORMIN HCL 500 MG PO TABS: 500.0000 mg | ORAL_TABLET | Freq: Two times a day (BID) | ORAL | 6 refills | Status: DC

## 2018-08-04 MED ORDER — ISOSORBIDE MONONITRATE ER 30 MG PO TB24: 60.0000 mg | ORAL_TABLET | Freq: Every day | ORAL | 0 refills | Status: DC

## 2018-08-04 MED ORDER — PREDNISONE 10 MG (21) PO TBPK: ORAL_TABLET | Freq: Every day | ORAL | 0 refills | Status: DC

## 2018-08-04 NOTE — Telephone Encounter (Signed)
Attempted to call patient to schedule an appointment with a different provider in our office. There has been some changes in Dr. Algis Downs schedule which requires his patients to be reassigned to a different provider in our office. Unable to reach patient at this time nor leave voicemail. [place]

## 2018-08-04 NOTE — Progress Notes (Addendum)
   Subjective:    Patient ID: Michael [name], male    DOB: Jul 16, 1951, 67 y.o.   MRN: [id number]  Chest Pain   This is a chronic problem. The current episode started more than 1 month ago. The onset quality is undetermined. The problem occurs constantly. The problem has been unchanged. The pain is present in the substernal region. The pain is at a severity of 7/10. The pain is moderate. The quality of the pain is described as tightness. The pain does not radiate. Associated symptoms include a cough and exertional chest pressure. Pertinent negatives include no fever or near-syncope. Associated symptoms comments: Chronic neck pain wheezing.      Review of Systems  Constitutional: Negative for fever.  Respiratory: Positive for cough.   Cardiovascular: Positive for chest pain. Negative for near-syncope.       Objective:   Physical Exam There were no vitals filed for this visit.  Gen: Pleasant, well-nourished, in no distress,  normal affect  ENT: No lesions,  mouth clear,  oropharynx clear, no postnasal drip  Neck: No JVD, no TMG, no carotid bruits  Lungs: No use of accessory muscles, no dullness to percussion, EXP WHEEZES  Cardiovascular: RRR, heart sounds normal, no murmur or gallops, no peripheral edema  Abdomen: soft and NT, no HSM,  BS normal  Musculoskeletal: No deformities, no cyanosis or clubbing  Neuro: alert, non focal  Skin: Warm, no lesions or rashes  No results found.   Impression  HTN  CAD  DM2  Refills sent on meds  Needs OV to f/u.       Assessment & Plan:

## 2018-08-11 ENCOUNTER — Telehealth: Payer: Self-pay | Admitting: Physician Assistant

## 2018-08-11 NOTE — Telephone Encounter (Signed)
Called patient at both numbers on his file and was not able to reach patient to schedule an appointment with Dr. Joya Gaskins.

## 2018-08-24 ENCOUNTER — Other Ambulatory Visit: Payer: Self-pay

## 2018-08-24 NOTE — Patient Outreach (Signed)
[facility]) Care Management  08/24/2018  Sadrac Zeoli Mar 15, 1951 [id number]   Medication Adherence call to Mr. Bosco Paparella patient's telephone number is disconnected patient is due on Pravastatin 40 mg under [facility].   [facility] Management Direct Dial [phone]  Fax [phone] Caidynce [email or web address]

## 2018-08-26 ENCOUNTER — Encounter (HOSPITAL_COMMUNITY): Payer: Self-pay | Admitting: *Deleted

## 2018-08-26 ENCOUNTER — Other Ambulatory Visit: Payer: Self-pay

## 2018-08-26 ENCOUNTER — Emergency Department (HOSPITAL_COMMUNITY)
Admission: EM | Admit: 2018-08-26 | Discharge: 2018-08-26 | Disposition: A | Discharge status: Home / Self Care | Payer: Medicare Other | Attending: Emergency Medicine | Admitting: Emergency Medicine

## 2018-08-26 DIAGNOSIS — F329 Major depressive disorder, single episode, unspecified: Secondary | ICD-10-CM | POA: Insufficient documentation

## 2018-08-26 DIAGNOSIS — I2510 Atherosclerotic heart disease of native coronary artery without angina pectoris: Secondary | ICD-10-CM | POA: Diagnosis not present

## 2018-08-26 DIAGNOSIS — F1490 Cocaine use, unspecified, uncomplicated: Secondary | ICD-10-CM | POA: Insufficient documentation

## 2018-08-26 DIAGNOSIS — R45851 Suicidal ideations: Secondary | ICD-10-CM

## 2018-08-26 DIAGNOSIS — I201 Angina pectoris with documented spasm: Secondary | ICD-10-CM | POA: Diagnosis not present

## 2018-08-26 DIAGNOSIS — I252 Old myocardial infarction: Secondary | ICD-10-CM | POA: Insufficient documentation

## 2018-08-26 DIAGNOSIS — I4891 Unspecified atrial fibrillation: Secondary | ICD-10-CM | POA: Diagnosis not present

## 2018-08-26 DIAGNOSIS — Z86711 Personal history of pulmonary embolism: Secondary | ICD-10-CM | POA: Insufficient documentation

## 2018-08-26 DIAGNOSIS — I10 Essential (primary) hypertension: Secondary | ICD-10-CM | POA: Diagnosis not present

## 2018-08-26 DIAGNOSIS — Z79899 Other long term (current) drug therapy: Secondary | ICD-10-CM | POA: Diagnosis not present

## 2018-08-26 DIAGNOSIS — Z794 Long term (current) use of insulin: Secondary | ICD-10-CM | POA: Insufficient documentation

## 2018-08-26 DIAGNOSIS — I200 Unstable angina: Secondary | ICD-10-CM | POA: Diagnosis not present

## 2018-08-26 DIAGNOSIS — B20 Human immunodeficiency virus [HIV] disease: Secondary | ICD-10-CM | POA: Diagnosis not present

## 2018-08-26 DIAGNOSIS — F419 Anxiety disorder, unspecified: Secondary | ICD-10-CM | POA: Diagnosis present

## 2018-08-26 DIAGNOSIS — Z743 Need for continuous supervision: Secondary | ICD-10-CM | POA: Diagnosis not present

## 2018-08-26 DIAGNOSIS — E1165 Type 2 diabetes mellitus with hyperglycemia: Secondary | ICD-10-CM | POA: Insufficient documentation

## 2018-08-26 DIAGNOSIS — R443 Hallucinations, unspecified: Secondary | ICD-10-CM | POA: Diagnosis not present

## 2018-08-26 DIAGNOSIS — R739 Hyperglycemia, unspecified: Secondary | ICD-10-CM

## 2018-08-26 LAB — URINALYSIS, ROUTINE W REFLEX MICROSCOPIC
Bacteria, UA: NONE SEEN
Bilirubin Urine: NEGATIVE
Glucose, UA: >=500 mg/dL — AB
Ketones, ur: 5 mg/dL — AB
Leukocytes, UA: NEGATIVE
Nitrite: NEGATIVE
Protein, ur: >=300 mg/dL — AB
Specific Gravity, Urine: 1.023 (ref 1.005–1.030)
pH: 5 (ref 5.0–8.0)

## 2018-08-26 LAB — COMPREHENSIVE METABOLIC PANEL
ALT: 15 U/L (ref 0–44)
ANION GAP: 7 (ref 5–15)
AST: 19 U/L (ref 15–41)
Albumin: 3.6 g/dL (ref 3.5–5.0)
Alkaline Phosphatase: 71 U/L (ref 38–126)
BUN: 18 mg/dL (ref 8–23)
CHLORIDE: 106 mmol/L (ref 98–111)
CO2: 26 mmol/L (ref 22–32)
Calcium: 8.7 mg/dL — ABNORMAL LOW (ref 8.9–10.3)
Creatinine, Ser: 1.44 mg/dL — ABNORMAL HIGH (ref 0.61–1.24)
GFR calc non Af Amer: 48 mL/min — ABNORMAL LOW (ref >60)
GFR, EST AFRICAN AMERICAN: 56 mL/min — AB (ref >60)
Glucose, Bld: 322 mg/dL — ABNORMAL HIGH (ref 70–99)
Potassium: 3.7 mmol/L (ref 3.5–5.1)
SODIUM: 139 mmol/L (ref 135–145)
Total Bilirubin: 0.8 mg/dL (ref 0.3–1.2)
Total Protein: 7.4 g/dL (ref 6.5–8.1)

## 2018-08-26 LAB — ACETAMINOPHEN LEVEL

## 2018-08-26 LAB — CBC
HCT: 33.8 % — ABNORMAL LOW (ref 39.0–52.0)
Hemoglobin: 10.4 g/dL — ABNORMAL LOW (ref 13.0–17.0)
MCH: 25.3 pg — AB (ref 26.0–34.0)
MCHC: 30.8 g/dL (ref 30.0–36.0)
MCV: 82.2 fL (ref 80.0–100.0)
NRBC: 0 % (ref 0.0–0.2)
PLATELETS: 154 10*3/uL (ref 150–400)
RBC: 4.11 MIL/uL — AB (ref 4.22–5.81)
RDW: 14.2 % (ref 11.5–15.5)
WBC: 3.8 10*3/uL — ABNORMAL LOW (ref 4.0–10.5)

## 2018-08-26 LAB — RAPID URINE DRUG SCREEN, HOSP PERFORMED
AMPHETAMINES: NOT DETECTED
BARBITURATES: NOT DETECTED
BENZODIAZEPINES: NOT DETECTED
Cocaine: POSITIVE — AB
Opiates: NOT DETECTED
TETRAHYDROCANNABINOL: NOT DETECTED

## 2018-08-26 LAB — ETHANOL: Alcohol, Ethyl (B): <10 mg/dL (ref <10)

## 2018-08-26 LAB — CBG MONITORING, ED
Glucose-Capillary: 305 mg/dL — ABNORMAL HIGH (ref 70–99)
Glucose-Capillary: 213 mg/dL — ABNORMAL HIGH (ref 70–99)

## 2018-08-26 LAB — SALICYLATE LEVEL

## 2018-08-26 MED ORDER — EMTRICITAB-RILPIVIR-TENOFOV AF 200-25-25 MG PO TABS: 1.0000 | ORAL_TABLET | Freq: Every day | ORAL | Status: DC

## 2018-08-26 MED ORDER — INSULIN ASPART 100 UNIT/ML ~~LOC~~ SOLN
5.0000 [IU] | Freq: Once | SUBCUTANEOUS | Status: AC
  Administered 2018-08-26: 5 [IU] via SUBCUTANEOUS
  Filled 2018-08-26: qty 1

## 2018-08-26 MED ORDER — RISPERIDONE 0.5 MG PO TABS
0.5000 mg | ORAL_TABLET | Freq: Every day | ORAL | Status: DC
  Administered 2018-08-26: 0.5 mg via ORAL
  Filled 2018-08-26: qty 1

## 2018-08-26 MED ORDER — INSULIN GLARGINE 100 UNIT/ML ~~LOC~~ SOLN
30.0000 [IU] | Freq: Every day | SUBCUTANEOUS | Status: DC
  Filled 2018-08-26: qty 0.3

## 2018-08-26 MED ORDER — GABAPENTIN 400 MG PO CAPS
400.0000 mg | ORAL_CAPSULE | Freq: Two times a day (BID) | ORAL | Status: DC
  Administered 2018-08-26: 400 mg via ORAL
  Filled 2018-08-26: qty 1

## 2018-08-26 MED ORDER — ASPIRIN 81 MG PO CHEW
81.0000 mg | CHEWABLE_TABLET | Freq: Every day | ORAL | Status: DC
  Administered 2018-08-26: 81 mg via ORAL
  Filled 2018-08-26: qty 1

## 2018-08-26 MED ORDER — METFORMIN HCL 500 MG PO TABS
500.0000 mg | ORAL_TABLET | Freq: Two times a day (BID) | ORAL | Status: DC
  Filled 2018-08-26: qty 1

## 2018-08-26 NOTE — ED Notes (Signed)
Bed: WB86 Expected date:  Expected time:  Means of arrival:  Comments: EMS/68 S.I.

## 2018-08-26 NOTE — ED Triage Notes (Signed)
Patient called EMS stating he was having anxiety and thoughts of killing himself. Patient has a history of SI and states he has tried cutting himself in the past.

## 2018-08-26 NOTE — BH Assessment (Signed)
Assessment Note  Michael [name] is an 67 y.o. male presenting voluntarily to [facility] ED via EMS. Patient reports "I'm stressed out and suicidal." Patient reviewed that "family stuff" and medical concerns are contributing to his depression. Patient had a triple bypass surgery in May followed by 1 month in rehabilitation facility. Patient additionally stated he has leg pain and asthma. Patient stated that he has not seen his primary care physician in "years." He asserted that he does not take his medications as prescribed. Patient stated that he has passive suicidal thoughts but cannot describe a specific plan. Patient also endorses passive HI as well, but without a specific person in mind. Patient stated that he experiences hearing "noises" but no specific voice. He denies visual hallucinations. Patient stated that he uses crack cocaine on a regular basis and has since the age of 34. He stated that he uses this as a result of depression. Patient denies any other drug or alcohol use. He stated that in the past he went to [facility] for substance use treatment but could not recall any specifics. He stated that he had one previous suicide attempt "many years ago" and was hospitalized at [facility]. Patient denies any family history of mental illness. Patient is currently homeless and living at different hotels. He has a history of staying in shelters. Patient denies a history of abuse or currently legal charges.  Patient was alert and oriented x4. He was dressed in scrubs. His speech was soft, almost inaudible. His thought process was logical and coherent. His mood and affect was depressed. His insight, judgment, and impulse control were impaired. He did not appear to be responding to internal stimuli or experiencing delusional thought content.  Per Dr. Mariea Clonts patient is psychiatrically cleared. He was referred to peer support for consult.  Diagnosis: F32.2 MDD recurrent, severe   F14.20 Cocaine use disorder,  severe  Past Medical History:  Past Medical History:  Diagnosis Date  . A-fib ([place])   . Asthma    No PFTs, history of childhood asthma  . CAD (coronary artery disease)    a. 06/2013 STEMI/PCI ([facility]): LAD w/ thrombus (treated with BMS), mid 75%, D2 75%; LCX OM2 75%; RCA small, PDA 95%, PLV 95%;  b. 10/2013 Cath/PCI: ISR w/in LAD (Promus DES x 2), borderline OM2 lesion;  c. 01/2014 MV: Intermediate risk, medium-sized distal ant wall infarct w/ very small amt of peri-infarct ischemia. EF 60%.  . Cellulitis 04/2014   left facial  . Chondromalacia of medial femoral condyle    Left knee MRI 04/28/12: Chondromalacia of the medial femoral condyle with slight peripheral degeneration of the meniscocapsular junction of the medial meniscus; followed by sports medicine  . Collagen vascular disease ([place])   . Crack cocaine use    for 20+ years, has been enrolled in detox programs in the past  . Depression    with history of hospitalization for suicidal ideation  . Diabetes mellitus 2002   Diagnosed in 2002, started insulin in 2012  . Gout   . Gout 04/28/2012  . Headache(784.0)    CT head 08/2011: Periventricular and subcortical white matter hypodensities are most in keeping with chronic microangiopathic change  . HIV infection [facility]) Nov 2012   Followed by [name]  . Hyperlipidemia   . Hypertension   . Pulmonary embolism [facility])     Past Surgical History:  Procedure Laterality Date  . BACK SURGERY     1988  . BOWEL RESECTION    . CARDIAC  SURGERY    . CERVICAL SPINE SURGERY     " rods in my neck "  . CORONARY ARTERY BYPASS GRAFT    . CORONARY STENT PLACEMENT    . NM MYOCAR PERF WALL MOTION  12/27/2011   normal  . SPINE SURGERY      Family History:  Family History  Problem Relation Age of Onset  . Diabetes Mother   . Hypertension Mother   . Hyperlipidemia Mother   . Diabetes Father   . Cancer Father   . Hypertension Father   . Diabetes Brother   . Heart disease Brother   . Diabetes  Sister   . Colon cancer Neg Hx     Social History:  reports that he has never smoked. He has never used smokeless tobacco. He reports that he has current or past drug history. Drugs: "Crack" cocaine and Cocaine. He reports that he does not drink alcohol.  Additional Social History:  Alcohol / Drug Use Pain Medications: see MAR Prescriptions: see MAR Over the Counter: see MAR History of alcohol / drug use?: Yes Longest period of sobriety (when/how long): UTA Substance #1 Name of Substance 1: Crack cocaine 1 - Age of First Use: 41 1 - Amount (size/oz): 1 gram 1 - Frequency: daily 1 - Duration: on and off for "years" 1 - Last Use / Amount: $20 worth on 08/25/18  CIWA: CIWA-Ar BP: (!) 160/96 Pulse Rate: 94 COWS:    Allergies: No Known Allergies  Home Medications:  (Not in a hospital admission)  OB/GYN Status:  No LMP for male patient.  General Assessment Data Location of Assessment: [facility] ED TTS Assessment: In system Is this a Tele or Face-to-Face Assessment?: Face-to-Face Is this an Initial Assessment or a Re-assessment for this encounter?: Initial Assessment Patient Accompanied by:: Other(self) Language Other than English: No Living Arrangements: Homeless/Shelter(reports living in different motels) What gender do you identify as?: Male Marital status: Divorced Pregnancy Status: No Living Arrangements: (alone) Can pt return to current living arrangement?: Yes Admission Status: Voluntary Is patient capable of signing voluntary admission?: Yes Referral Source: Self/Family/Friend Insurance type: Medicare     Crisis Care Plan Living Arrangements: (alone)     Risk to self with the past 6 months Suicidal Ideation: Yes-Currently Present Has patient been a risk to self within the past 6 months prior to admission? : No Suicidal Intent: No Has patient had any suicidal intent within the past 6 months prior to admission? : No Is patient at risk for suicide?: No Suicidal  Plan?: No Has patient had any suicidal plan within the past 6 months prior to admission? : No Access to Means: No What has been your use of drugs/alcohol within the last 12 months?: crack cocaine Previous Attempts/Gestures: Yes How many times?: 1 Other Self Harm Risks: drug use Triggers for Past Attempts: Family contact Intentional Self Injurious Behavior: None Family Suicide History: No Recent stressful life event(s): Financial Problems, Other (Comment)(homelessness, multiple medical concenrs) Persecutory voices/beliefs?: No Depression: Yes Depression Symptoms: Insomnia, Tearfulness, Isolating, Loss of interest in usual pleasures, Feeling worthless/self pity, Feeling angry/irritable Substance abuse history and/or treatment for substance abuse?: Yes Suicide prevention information given to non-admitted patients: Yes  Risk to Others within the past 6 months Homicidal Ideation: No-Not Currently/Within Last 6 Months Does patient have any lifetime risk of violence toward others beyond the six months prior to admission? : No Thoughts of Harm to Others: No-Not Currently Present/Within Last 6 Months Current Homicidal Intent: No Current Homicidal  Plan: No Access to Homicidal Means: No Identified Victim: none History of harm to others?: No Assessment of Violence: None Noted Violent Behavior Description: none Does patient have access to weapons?: No Criminal Charges Pending?: No Does patient have a court date: No Is patient on probation?: No  Psychosis Hallucinations: Auditory Delusions: None noted  Mental Status Report Appearance/Hygiene: In scrubs Eye Contact: Good Motor Activity: Unremarkable Speech: Logical/coherent, Soft Level of Consciousness: Alert Mood: Depressed Affect: Sad Anxiety Level: Moderate Thought Processes: Coherent Judgement: Partial Obsessive Compulsive Thoughts/Behaviors: None  Cognitive Functioning Concentration: Normal Memory: Recent Intact, Remote  Intact Is patient IDD: No Insight: Fair Impulse Control: Poor Appetite: Poor Have you had any weight changes? : (pt reports losing 4 inches in his waist) Sleep: Decreased Total Hours of Sleep: (wakes up 4-5 times per night) Vegetative Symptoms: None  ADLScreening [facility] Assessment Services) Patient's cognitive ability adequate to safely complete daily activities?: Yes Patient able to express need for assistance with ADLs?: Yes Independently performs ADLs?: Yes (appropriate for developmental age)        ADL Screening (condition at time of admission) Patient's cognitive ability adequate to safely complete daily activities?: Yes Is the patient deaf or have difficulty hearing?: No Does the patient have difficulty seeing, even when wearing glasses/contacts?: No Does the patient have difficulty concentrating, remembering, or making decisions?: No Patient able to express need for assistance with ADLs?: Yes Does the patient have difficulty dressing or bathing?: No Independently performs ADLs?: Yes (appropriate for developmental age) Does the patient have difficulty walking or climbing stairs?: Yes Weakness of Legs: Both Weakness of Arms/Hands: None  Home Assistive Devices/Equipment Home Assistive Devices/Equipment: Cane (specify quad or straight)  Therapy Consults (therapy consults require a physician order) PT Evaluation Needed: No OT Evalulation Needed: No SLP Evaluation Needed: No Abuse/Neglect Assessment (Assessment to be complete while patient is alone) Abuse/Neglect Assessment Can Be Completed: Yes Physical Abuse: Denies Verbal Abuse: Denies Sexual Abuse: Denies Exploitation of patient/patient's resources: Denies Self-Neglect: Denies Values / Beliefs Cultural Requests During Hospitalization: None Spiritual Requests During Hospitalization: None Consults Spiritual Care Consult Needed: No Social Work Consult Needed: No Regulatory affairs officer (For Healthcare) Does Patient Have  a Medical Advance Directive?: No Would patient like information on creating a medical advance directive?: No - Patient declined          Disposition: Per Dr. Mariea Clonts patient is psychiatrically cleared. He was referred to peer support for consult.    On Site Evaluation by:   Reviewed with Physician:    Orvis Brill 08/26/2018 1:53 PM

## 2018-08-26 NOTE — ED Provider Notes (Signed)
[facility] DEPT Provider Note   CSN: [id number] Arrival date & time: 08/26/18  0813     History   Chief Complaint Chief Complaint  Patient presents with  . Addiction Problem  . Suicidal    HPI Michael [name] is a 67 y.o. male.  HPI Patient states that since moving in with his brother he has become increasingly anxious and having suicidal thoughts.  States he used crack cocaine yesterday evening which worsened his anxiety.  Believes he is having auditory hallucinations.  Currently denying any hallucinations.  Continues to have vague suicidal thoughts but no active plan.  Denies any other coingestants. Past Medical History:  Diagnosis Date  . A-fib ([place])   . Asthma    No PFTs, history of childhood asthma  . CAD (coronary artery disease)    a. 06/2013 STEMI/PCI ([facility]): LAD w/ thrombus (treated with BMS), mid 75%, D2 75%; LCX OM2 75%; RCA small, PDA 95%, PLV 95%;  b. 10/2013 Cath/PCI: ISR w/in LAD (Promus DES x 2), borderline OM2 lesion;  c. 01/2014 MV: Intermediate risk, medium-sized distal ant wall infarct w/ very small amt of peri-infarct ischemia. EF 60%.  . Cellulitis 04/2014   left facial  . Chondromalacia of medial femoral condyle    Left knee MRI 04/28/12: Chondromalacia of the medial femoral condyle with slight peripheral degeneration of the meniscocapsular junction of the medial meniscus; followed by sports medicine  . Collagen vascular disease ([place])   . Crack cocaine use    for 20+ years, has been enrolled in detox programs in the past  . Depression    with history of hospitalization for suicidal ideation  . Diabetes mellitus 2002   Diagnosed in 2002, started insulin in 2012  . Gout   . Gout 04/28/2012  . Headache(784.0)    CT head 08/2011: Periventricular and subcortical white matter hypodensities are most in keeping with chronic microangiopathic change  . HIV infection [facility]) Nov 2012   Followed by [name]  . Hyperlipidemia   .  Hypertension   . Pulmonary embolism [facility])     Patient Active Problem List   Diagnosis Date Noted  . Type II diabetes mellitus with renal manifestations ([place]) 05/04/2018  . GERD (gastroesophageal reflux disease) 05/04/2018  . S/P CABG (coronary artery bypass graft)   . Left testicular pain   . Depression 02/20/2018  . Epididymo-orchitis, acute 02/20/2018  . Essential hypertension 02/20/2018  . Precordial chest pain 02/20/2018  . AKI (acute kidney injury) ([place]) 02/14/2018  . HCAP (healthcare-associated pneumonia) 02/14/2018  . History of pulmonary embolism 07/04/2017  . Acute hyponatremia 05/11/2017  . Hyperglycemia due to type 2 diabetes mellitus ([place]) 05/11/2017  . CHF (congestive heart failure) ([place]) 04/01/2017  . Hepatitis C 12/30/2016  . Chronic ischemic heart disease 11/12/2016  . Paroxysmal A-fib ([place]) 11/12/2016  . Back pain 04/18/2016  . S/P carotid endarterectomy 11/15/2015  . MDD (major depressive disorder), recurrent severe, without psychosis ([place]) 09/09/2015  . Cocaine-induced mood disorder ([place]) 08/14/2015  . Cocaine abuse with cocaine-induced mood disorder ([place]) 08/14/2015  . Gout 07/10/2015  . Acute renal failure superimposed on stage 3 chronic kidney disease ([place]) 03/06/2015  . Normocytic anemia 03/06/2015  . Chronic kidney disease, stage III (moderate) ([place]) 03/06/2015  . Hypoglycemia   . Encounter for general adult medical examination with abnormal findings 02/09/2015  . Cocaine use disorder, severe, dependence ([place]) 12/13/2014  . Substance or medication-induced depressive disorder with onset during withdrawal ([place]) 12/13/2014  . Severe  recurrent major depressive disorder with psychotic features ([place]) 12/12/2014  . Cervicalgia 06/28/2014  . Lumbar radiculopathy, chronic 06/28/2014  . Asthma, chronic 02/03/2014  . 3-vessel CAD 06/24/2013  . ED (erectile dysfunction) of organic origin 07/07/2012  . Hypertension goal BP (blood pressure) < 140/80 04/29/2012  .  Chondromalacia of left knee 03/19/2012  . Hyperlipidemia with target LDL less than 100 02/12/2012  . Fibromyalgia 02/12/2012  . HIV (human immunodeficiency virus infection) ([name]) 08/27/2011  . Uncontrolled type 2 diabetes with neuropathy ([name]) 10/17/2000    Past Surgical History:  Procedure Laterality Date  . BACK SURGERY     1988  . BOWEL RESECTION    . CARDIAC SURGERY    . CERVICAL SPINE SURGERY     " rods in my neck "  . CORONARY ARTERY BYPASS GRAFT    . CORONARY STENT PLACEMENT    . NM MYOCAR PERF WALL MOTION  12/27/2011   normal  . SPINE SURGERY          Home Medications    Prior to Admission medications   Medication Sig Start Date End Date Taking? Authorizing Provider  ACCU-CHEK SOFTCLIX LANCETS lancets USE TO CHECK BLOOD GLUCOSE LEVELS UP TO 4 TIMES DAILY AS DIRECTED 11/24/17  Yes Nwoko, Herbert Pun I, NP  albuterol (PROAIR HFA) 108 (90 Base) MCG/ACT inhaler INHALE TWO PUFFS INTO THE LUNGS EVERY 6 (SIX) HOURS AS NEEDED FOR WHEEZING OR SHORTNESS OF BREATH. Patient taking differently: Inhale 2 puffs into the lungs every 6 (six) hours as needed for wheezing or shortness of breath.  11/24/17  Yes Lindell Spar I, NP  allopurinol (ZYLOPRIM) 300 MG tablet Take 1 tablet (300 mg total) by mouth daily. For gout. 11/25/17  Yes Lindell Spar I, NP  aspirin 81 MG chewable tablet Chew 81 mg by mouth daily.   Yes [provider]  emtricitabine-rilpivir-tenofovir AF (ODEFSEY) 200-25-25 MG TABS tablet Take 1 tablet by mouth daily with breakfast. For HIV infection 11/24/17  Yes Nwoko, Herbert Pun I, NP  gabapentin (NEURONTIN) 400 MG capsule Take 1 capsule (400 mg total) by mouth 2 (two) times daily. For agitation/neuropathic pain Patient taking differently: Take 400 mg by mouth 2 (two) times daily as needed (For agitation/neuropathic pain).  11/24/17  Yes Lindell Spar I, NP  hydrOXYzine (ATARAX/VISTARIL) 25 MG tablet Take 1 tablet (25 mg total) by mouth 3 (three) times daily as needed for anxiety.  11/24/17  Yes Lindell Spar I, NP  insulin glargine (LANTUS) 100 UNIT/ML injection Inject 0.3 mLs (30 Units total) into the skin at bedtime. For diabetes management 08/04/18  Yes [name], MD  Insulin Syringe-Needle U-100 (B-D INS SYR MICROFINE 1CC/27G) 27G X 5/8" 1 ML MISC 1 strip by Does not apply route 3 (three) times daily as needed. For blood sugar checks 11/24/17  Yes Lindell Spar I, NP  metFORMIN (GLUCOPHAGE) 500 MG tablet Take 1 tablet (500 mg total) by mouth 2 (two) times daily with a meal. 08/04/18  Yes [name], MD  risperiDONE (RISPERDAL) 0.5 MG tablet Take 0.5 mg by mouth daily. 06/16/17  Yes [provider]  benzonatate (TESSALON PERLES) 100 MG capsule Take 1 capsule (100 mg total) by mouth 3 (three) times daily as needed for cough. FOR DISRUPTIVE DAY TIME COUGH Patient not taking: Reported on 08/26/2018 06/09/18   Kinnie Feil, PA-C  colchicine 0.6 MG tablet Take 1 tablet (0.6 mg total) by mouth daily. Patient not taking: Reported on 08/26/2018 02/27/18   Johnsie Cancel,  NP  diltiazem (CARDIZEM CD) 120 MG 24 hr capsule Take 1 capsule (120 mg total) by mouth daily. For high blood pressure Patient not taking: Reported on 08/26/2018 08/04/18   [name], MD  famotidine (PEPCID) 20 MG tablet Take 1 tablet (20 mg total) by mouth daily. For acid reflux Patient not taking: Reported on 08/26/2018 11/24/17   Lindell Spar I, NP  insulin lispro (HUMALOG) 100 UNIT/ML injection Inject 0.05 mLs (5 Units total) into the skin 3 (three) times daily with meals. For diabetes management Patient not taking: Reported on 08/26/2018 11/24/17   Lindell Spar I, NP  isosorbide mononitrate (IMDUR) 30 MG 24 hr tablet Take 2 tablets (60 mg total) by mouth daily. For chest Patient not taking: Reported on 08/26/2018 08/04/18   [name], MD  pravastatin (PRAVACHOL) 40 MG tablet Take 1 tablet (40 mg total) by mouth at bedtime. For high cholesterol Patient not taking:  Reported on 08/26/2018 11/24/17   Lindell Spar I, NP  predniSONE (STERAPRED UNI-PAK 21 TAB) 10 MG (21) TBPK tablet Take by mouth daily. Take 6 tabs by mouth daily  for 2 days, then 5 tabs for 2 days, then 4 tabs for 2 days, then 3 tabs for 2 days, 2 tabs for 2 days, then 1 tab by mouth daily for 2 days Patient not taking: Reported on 08/26/2018 08/04/18   [name], MD  sertraline (ZOLOFT) 50 MG tablet Take 1 tablet (50 mg total) by mouth daily. For depression Patient not taking: Reported on 08/26/2018 11/25/17   Lindell Spar I, NP  traZODone (DESYREL) 50 MG tablet Take 1 tablet (50 mg total) by mouth at bedtime as needed for sleep. Patient not taking: Reported on 08/26/2018 11/24/17   Encarnacion Slates, NP    Family History Family History  Problem Relation Age of Onset  . Diabetes Mother   . Hypertension Mother   . Hyperlipidemia Mother   . Diabetes Father   . Cancer Father   . Hypertension Father   . Diabetes Brother   . Heart disease Brother   . Diabetes Sister   . Colon cancer Neg Hx     Social History Social History   Tobacco Use  . Smoking status: Never Smoker  . Smokeless tobacco: Never Used  Substance Use Topics  . Alcohol use: No    Alcohol/week: 4.0 standard drinks    Types: 2 Cans of beer, 2 Shots of liquor per week  . Drug use: Yes    Types: "Crack" cocaine, Cocaine    Comment: last use January 2018     Allergies   Patient has no known allergies.   Review of Systems Review of Systems  Constitutional: Negative for chills and fever.  HENT: Negative for sore throat and trouble swallowing.   Respiratory: Negative for cough and shortness of breath.   Cardiovascular: Negative for chest pain, palpitations and leg swelling.  Gastrointestinal: Negative for abdominal pain, constipation, diarrhea, nausea and vomiting.  Musculoskeletal: Negative for back pain, myalgias and neck pain.  Skin: Negative for rash and wound.  Neurological: Negative for dizziness,  weakness, light-headedness, numbness and headaches.  Psychiatric/Behavioral: Positive for hallucinations and suicidal ideas. The patient is nervous/anxious.   All other systems reviewed and are negative.    Physical Exam Updated Vital Signs BP (!) 144/93 (BP Location: Left Arm)   Pulse 95   Temp 98 F (36.7 C) (Oral)   Resp 20   SpO2 98%   Physical Exam  Constitutional: He is oriented to person, place, and time. He appears well-developed and well-nourished.  HENT:  Head: Normocephalic and atraumatic.  Mouth/Throat: Oropharynx is clear and moist. No oropharyngeal exudate.  Eyes: Pupils are equal, round, and reactive to light. EOM are normal.  Neck: Normal range of motion. Neck supple.  Cardiovascular: Normal rate and regular rhythm. Exam reveals no gallop and no friction rub.  No murmur heard. Pulmonary/Chest: Effort normal and breath sounds normal.  Abdominal: Soft. Bowel sounds are normal. There is no tenderness. There is no rebound and no guarding.  Musculoskeletal: Normal range of motion. He exhibits no edema or tenderness.  Neurological: He is alert and oriented to person, place, and time.  Moving all extremities without focal deficit.  Sensation intact.  Skin: Skin is warm and dry. No rash noted. No erythema.  Psychiatric:  Good eye contact.  Admits to suicidal thoughts.  No active hallucinations.  Nursing note and vitals reviewed.    ED Treatments / Results  Labs (all labs ordered are listed, but only abnormal results are displayed) Labs Reviewed  RAPID URINE DRUG SCREEN, HOSP PERFORMED - Abnormal; Notable for the following components:      Result Value   Cocaine POSITIVE (*)    All other components within normal limits  COMPREHENSIVE METABOLIC PANEL - Abnormal; Notable for the following components:   Glucose, Bld 322 (*)    Creatinine, Ser 1.44 (*)    Calcium 8.7 (*)    GFR calc non Af Amer 48 (*)    GFR calc Af Amer 56 (*)    All other components within  normal limits  ACETAMINOPHEN LEVEL - Abnormal; Notable for the following components:   Acetaminophen (Tylenol), Serum <10 (*)    All other components within normal limits  CBC - Abnormal; Notable for the following components:   WBC 3.8 (*)    RBC 4.11 (*)    Hemoglobin 10.4 (*)    HCT 33.8 (*)    MCH 25.3 (*)    All other components within normal limits  URINALYSIS, ROUTINE W REFLEX MICROSCOPIC - Abnormal; Notable for the following components:   Glucose, UA >=500 (*)    Hgb urine dipstick MODERATE (*)    Ketones, ur 5 (*)    Protein, ur >=300 (*)    All other components within normal limits  CBG MONITORING, ED - Abnormal; Notable for the following components:   Glucose-Capillary 305 (*)    All other components within normal limits  CBG MONITORING, ED - Abnormal; Notable for the following components:   Glucose-Capillary 213 (*)    All other components within normal limits  ETHANOL  SALICYLATE LEVEL    EKG EKG Interpretation  Date/Time:  Wednesday August 26 2018 09:46:15 EST Ventricular Rate:  84 PR Interval:  146 QRS Duration: 86 QT Interval:  360 QTC Calculation: 425 R Axis:   53 Text Interpretation:  Sinus rhythm with marked sinus arrhythmia Nonspecific T wave abnormality Abnormal ECG No significant change since last tracing Confirmed by Lacretia Leigh ([id number]) on 08/27/2018 5:17:34 PM   Radiology No results found.  Procedures Procedures (including critical care time)  Medications Ordered in ED Medications  insulin aspart (novoLOG) injection 5 Units (5 Units Subcutaneous Given 08/26/18 0944)     Initial Impression / Assessment and Plan / ED Course  I have reviewed the triage vital signs and the nursing notes.  Pertinent labs & imaging results that were available during my care of the patient were reviewed by  me and considered in my medical decision making (see chart for details).     Patient with history of diabetes and did not take his medication this  morning.  Elevated blood sugar.  Will give subq insulin and complete basic medical screening work-up for psychiatric evaluation  Blood sugar is improved.  Patient is cleared for psychiatric evaluation. Final Clinical Impressions(s) / ED Diagnoses   Final diagnoses:  Hyperglycemia  Suicidal ideation    ED Discharge Orders    None       Julianne Rice, MD 08/29/18 [phone]

## 2018-08-26 NOTE — Patient Outreach (Signed)
ED Peer Support Specialist Patient Intake (Complete at intake & 30-60 Day Follow-up)  Name: Michael [name]  MRN: [id number]  Age: 67 y.o.   Date of Admission: 08/26/2018  Intake: Initial Comments:      Primary Reason Admitted:  Substance Abuse.   Lab values: Alcohol/ETOH: Positive Positive UDS? Yes Amphetamines: No Barbiturates: No Benzodiazepines: No Cocaine: Yes Opiates: No Cannabinoids: No  Demographic information: Gender: Male Ethnicity: African American Marital Status: Divorced Insurance Status: Diplomatic Services operational officer (Work Neurosurgeon, Physicist, medical, etc.: Yes Lives with: Alone Living situation: House/Apartment  Reported Patient History: Patient reported health conditions: Depression Patient aware of HIV and hepatitis status: No  In past year, has patient visited ED for any reason? Yes  Number of ED visits: 13  Reason(s) for visit: various reasons   In past year, has patient been hospitalized for any reason? Yes  Number of hospitalizations:    Reason(s) for hospitalization:    In past year, has patient been arrested? No  Number of arrests:    Reason(s) for arrest:    In past year, has patient been incarcerated? No  Number of incarcerations:    Reason(s) for incarceration:    In past year, has patient received medication-assisted treatment? No  In past year, patient received the following treatments: Other (comment)  In past year, has patient received any harm reduction services? No  Did this include any of the following?    In past year, has patient received care from a mental health provider for diagnosis other than SUD? No  In past year, is this first time patient has overdosed? No  Number of past overdoses:    In past year, is this first time patient has been hospitalized for an overdose? No  Number of hospitalizations for overdose(s):    Is patient currently receiving treatment for a mental health  diagnosis? No  Patient reports experiencing difficulty participating in SUD treatment: No    Most important reason(s) for this difficulty?    Has patient received prior services for treatment? No  In past, patient has received services from following agencies:    Plan of Care:  Suggested follow up at these agencies/treatment centers: Other (comment)  Other information: CPSS met with Pt and was able to process with Pt and was made aware that Pt has been doing better but had a small slip up last night because of a family matter. CPSS praised Pt and was able to talk about the progress Pt had made at this point. CPSS asked Pt what services Pt was seeking. CPSS offered to give Pt NA meeting list for Pt to attend NA groups when he feel he needs to be apart of a group. CPSS left contact information to help keep him into a better place.    Aaron Edelman Chelby Salata, CPSS  08/26/2018 3:20 PM

## 2018-08-26 NOTE — ED Notes (Signed)
1 white patient belonging bag and 1 black bag and 1 blue bag placed in locker 32.

## 2018-09-02 ENCOUNTER — Emergency Department (HOSPITAL_COMMUNITY): Payer: Medicare Other

## 2018-09-02 ENCOUNTER — Emergency Department (HOSPITAL_COMMUNITY)
Admission: EM | Admit: 2018-09-02 | Discharge: 2018-09-02 | Disposition: A | Discharge status: Home / Self Care | Payer: Medicare Other | Attending: Emergency Medicine | Admitting: Emergency Medicine

## 2018-09-02 ENCOUNTER — Encounter (HOSPITAL_COMMUNITY): Payer: Self-pay

## 2018-09-02 DIAGNOSIS — N183 Chronic kidney disease, stage 3 (moderate): Secondary | ICD-10-CM | POA: Insufficient documentation

## 2018-09-02 DIAGNOSIS — Y929 Unspecified place or not applicable: Secondary | ICD-10-CM | POA: Insufficient documentation

## 2018-09-02 DIAGNOSIS — R739 Hyperglycemia, unspecified: Secondary | ICD-10-CM

## 2018-09-02 DIAGNOSIS — Z7902 Long term (current) use of antithrombotics/antiplatelets: Secondary | ICD-10-CM | POA: Diagnosis not present

## 2018-09-02 DIAGNOSIS — S3991XA Unspecified injury of abdomen, initial encounter: Secondary | ICD-10-CM | POA: Diagnosis not present

## 2018-09-02 DIAGNOSIS — W109XXA Fall (on) (from) unspecified stairs and steps, initial encounter: Secondary | ICD-10-CM | POA: Diagnosis not present

## 2018-09-02 DIAGNOSIS — I10 Essential (primary) hypertension: Secondary | ICD-10-CM | POA: Diagnosis not present

## 2018-09-02 DIAGNOSIS — E1122 Type 2 diabetes mellitus with diabetic chronic kidney disease: Secondary | ICD-10-CM | POA: Diagnosis not present

## 2018-09-02 DIAGNOSIS — R1084 Generalized abdominal pain: Secondary | ICD-10-CM | POA: Insufficient documentation

## 2018-09-02 DIAGNOSIS — Z79899 Other long term (current) drug therapy: Secondary | ICD-10-CM | POA: Diagnosis not present

## 2018-09-02 DIAGNOSIS — I129 Hypertensive chronic kidney disease with stage 1 through stage 4 chronic kidney disease, or unspecified chronic kidney disease: Secondary | ICD-10-CM | POA: Diagnosis not present

## 2018-09-02 DIAGNOSIS — W108XXA Fall (on) (from) other stairs and steps, initial encounter: Secondary | ICD-10-CM

## 2018-09-02 DIAGNOSIS — D649 Anemia, unspecified: Secondary | ICD-10-CM | POA: Diagnosis not present

## 2018-09-02 DIAGNOSIS — R51 Headache: Secondary | ICD-10-CM | POA: Diagnosis not present

## 2018-09-02 DIAGNOSIS — S199XXA Unspecified injury of neck, initial encounter: Secondary | ICD-10-CM | POA: Diagnosis not present

## 2018-09-02 DIAGNOSIS — Y999 Unspecified external cause status: Secondary | ICD-10-CM | POA: Insufficient documentation

## 2018-09-02 DIAGNOSIS — S0990XA Unspecified injury of head, initial encounter: Secondary | ICD-10-CM | POA: Diagnosis not present

## 2018-09-02 DIAGNOSIS — S299XXA Unspecified injury of thorax, initial encounter: Secondary | ICD-10-CM | POA: Diagnosis not present

## 2018-09-02 DIAGNOSIS — R0789 Other chest pain: Secondary | ICD-10-CM | POA: Diagnosis not present

## 2018-09-02 DIAGNOSIS — I2510 Atherosclerotic heart disease of native coronary artery without angina pectoris: Secondary | ICD-10-CM | POA: Diagnosis not present

## 2018-09-02 DIAGNOSIS — E1165 Type 2 diabetes mellitus with hyperglycemia: Secondary | ICD-10-CM | POA: Diagnosis not present

## 2018-09-02 DIAGNOSIS — R55 Syncope and collapse: Secondary | ICD-10-CM | POA: Insufficient documentation

## 2018-09-02 DIAGNOSIS — Z794 Long term (current) use of insulin: Secondary | ICD-10-CM | POA: Insufficient documentation

## 2018-09-02 DIAGNOSIS — Y9301 Activity, walking, marching and hiking: Secondary | ICD-10-CM | POA: Diagnosis not present

## 2018-09-02 DIAGNOSIS — Z743 Need for continuous supervision: Secondary | ICD-10-CM | POA: Diagnosis not present

## 2018-09-02 DIAGNOSIS — R079 Chest pain, unspecified: Secondary | ICD-10-CM | POA: Diagnosis not present

## 2018-09-02 LAB — URINALYSIS, ROUTINE W REFLEX MICROSCOPIC
BACTERIA UA: NONE SEEN
Bilirubin Urine: NEGATIVE
Glucose, UA: >=500 mg/dL — AB
Ketones, ur: NEGATIVE mg/dL
Leukocytes, UA: NEGATIVE
Nitrite: NEGATIVE
PH: 6 (ref 5.0–8.0)
Protein, ur: 100 mg/dL — AB
SPECIFIC GRAVITY, URINE: 1.022 (ref 1.005–1.030)

## 2018-09-02 LAB — COMPREHENSIVE METABOLIC PANEL
ALT: 17 U/L (ref 0–44)
AST: 19 U/L (ref 15–41)
Albumin: 3.4 g/dL — ABNORMAL LOW (ref 3.5–5.0)
Alkaline Phosphatase: 73 U/L (ref 38–126)
Anion gap: 10 (ref 5–15)
BUN: 20 mg/dL (ref 8–23)
CO2: 22 mmol/L (ref 22–32)
CREATININE: 1.49 mg/dL — AB (ref 0.61–1.24)
Calcium: 9.3 mg/dL (ref 8.9–10.3)
Chloride: 106 mmol/L (ref 98–111)
GFR, EST AFRICAN AMERICAN: 55 mL/min — AB (ref >60)
GFR, EST NON AFRICAN AMERICAN: 48 mL/min — AB (ref >60)
Glucose, Bld: 435 mg/dL — ABNORMAL HIGH (ref 70–99)
POTASSIUM: 4.6 mmol/L (ref 3.5–5.1)
Sodium: 138 mmol/L (ref 135–145)
TOTAL PROTEIN: 7.2 g/dL (ref 6.5–8.1)
Total Bilirubin: 0.6 mg/dL (ref 0.3–1.2)

## 2018-09-02 LAB — CBC WITH DIFFERENTIAL/PLATELET
ABS IMMATURE GRANULOCYTES: 0.02 10*3/uL (ref 0.00–0.07)
Basophils Absolute: 0 10*3/uL (ref 0.0–0.1)
Basophils Relative: 1 %
Eosinophils Absolute: 0.1 10*3/uL (ref 0.0–0.5)
Eosinophils Relative: 2 %
HCT: 36.4 % — ABNORMAL LOW (ref 39.0–52.0)
HEMOGLOBIN: 11.1 g/dL — AB (ref 13.0–17.0)
Immature Granulocytes: 0 %
LYMPHS PCT: 21 %
Lymphs Abs: 1.2 10*3/uL (ref 0.7–4.0)
MCH: 25 pg — AB (ref 26.0–34.0)
MCHC: 30.5 g/dL (ref 30.0–36.0)
MCV: 82 fL (ref 80.0–100.0)
Monocytes Absolute: 0.5 10*3/uL (ref 0.1–1.0)
Monocytes Relative: 9 %
NEUTROS ABS: 3.9 10*3/uL (ref 1.7–7.7)
NRBC: 0 % (ref 0.0–0.2)
Neutrophils Relative %: 67 %
Platelets: 156 10*3/uL (ref 150–400)
RBC: 4.44 MIL/uL (ref 4.22–5.81)
RDW: 13.8 % (ref 11.5–15.5)
WBC: 5.8 10*3/uL (ref 4.0–10.5)

## 2018-09-02 LAB — I-STAT TROPONIN, ED: TROPONIN I, POC: 0 ng/mL (ref 0.00–0.08)

## 2018-09-02 LAB — CBG MONITORING, ED
Glucose-Capillary: 425 mg/dL — ABNORMAL HIGH (ref 70–99)
Glucose-Capillary: 212 mg/dL — ABNORMAL HIGH (ref 70–99)

## 2018-09-02 LAB — RAPID URINE DRUG SCREEN, HOSP PERFORMED
Amphetamines: NOT DETECTED
BARBITURATES: NOT DETECTED
BENZODIAZEPINES: NOT DETECTED
COCAINE: NOT DETECTED
OPIATES: NOT DETECTED
Tetrahydrocannabinol: NOT DETECTED

## 2018-09-02 LAB — ETHANOL

## 2018-09-02 MED ORDER — FENTANYL CITRATE (PF) 100 MCG/2ML IJ SOLN
50.0000 ug | Freq: Once | INTRAMUSCULAR | Status: DC
  Filled 2018-09-02: qty 2

## 2018-09-02 MED ORDER — IOPAMIDOL (ISOVUE-300) INJECTION 61%
100.0000 mL | Freq: Once | INTRAVENOUS | Status: AC | PRN
  Administered 2018-09-02: 100 mL via INTRAVENOUS

## 2018-09-02 MED ORDER — INSULIN ASPART 100 UNIT/ML ~~LOC~~ SOLN
10.0000 [IU] | Freq: Once | SUBCUTANEOUS | Status: AC
  Administered 2018-09-02: 10 [IU] via SUBCUTANEOUS

## 2018-09-02 MED ORDER — ACETAMINOPHEN 325 MG PO TABS
650.0000 mg | ORAL_TABLET | Freq: Once | ORAL | Status: AC
  Administered 2018-09-02: 650 mg via ORAL
  Filled 2018-09-02: qty 2

## 2018-09-02 MED ORDER — INSULIN ASPART 100 UNIT/ML ~~LOC~~ SOLN: 10.0000 [IU] | Freq: Once | SUBCUTANEOUS | Status: DC

## 2018-09-02 MED ORDER — FENTANYL CITRATE (PF) 100 MCG/2ML IJ SOLN
50.0000 ug | Freq: Once | INTRAMUSCULAR | Status: AC
  Administered 2018-09-02: 50 ug via INTRAVENOUS

## 2018-09-02 MED ORDER — MORPHINE SULFATE (PF) 4 MG/ML IV SOLN
4.0000 mg | Freq: Once | INTRAVENOUS | Status: AC
  Administered 2018-09-02: 4 mg via INTRAVENOUS
  Filled 2018-09-02: qty 1

## 2018-09-02 MED ORDER — INSULIN ASPART 100 UNIT/ML IV SOLN: 10.0000 [IU] | Freq: Once | INTRAVENOUS | Status: DC

## 2018-09-02 MED ORDER — ONDANSETRON HCL 4 MG/2ML IJ SOLN
4.0000 mg | Freq: Once | INTRAMUSCULAR | Status: AC
  Administered 2018-09-02: 4 mg via INTRAVENOUS
  Filled 2018-09-02: qty 2

## 2018-09-02 NOTE — ED Notes (Signed)
Insulin not available in pixis.  Pharmacy notified.

## 2018-09-02 NOTE — ED Provider Notes (Signed)
[facility] EMERGENCY DEPARTMENT Provider Note   CSN: [id number] Arrival date & time: 09/02/18  0867     History   Chief Complaint Chief Complaint  Patient presents with  . Fall    HPI Michael [name] is a 67 y.o. male.  The history is provided by the patient and the EMS personnel.  He has a history of diabetes, hypertension, hyperlipidemia, pulmonary embolism, atrial fibrillation, coronary artery disease who was brought in by ambulance after falling down flight of approximately 8 concrete steps.  He does not know if there is loss of consciousness, but he does not remember the incident.  He denies nausea or vomiting.  Is complaining of pain in his mid chest area.  He denies abdomen or extremity pain.  He rates pain at 8/10.  Also, he states that he had run out of his insulin several days ago.  EMS reports blood glucose of 467.  Past Medical History:  Diagnosis Date  . A-fib ([place])   . Asthma    No PFTs, history of childhood asthma  . CAD (coronary artery disease)    a. 06/2013 STEMI/PCI ([facility]): LAD w/ thrombus (treated with BMS), mid 75%, D2 75%; LCX OM2 75%; RCA small, PDA 95%, PLV 95%;  b. 10/2013 Cath/PCI: ISR w/in LAD (Promus DES x 2), borderline OM2 lesion;  c. 01/2014 MV: Intermediate risk, medium-sized distal ant wall infarct w/ very small amt of peri-infarct ischemia. EF 60%.  . Cellulitis 04/2014   left facial  . Chondromalacia of medial femoral condyle    Left knee MRI 04/28/12: Chondromalacia of the medial femoral condyle with slight peripheral degeneration of the meniscocapsular junction of the medial meniscus; followed by sports medicine  . Collagen vascular disease ([place])   . Crack cocaine use    for 20+ years, has been enrolled in detox programs in the past  . Depression    with history of hospitalization for suicidal ideation  . Diabetes mellitus 2002   Diagnosed in 2002, started insulin in 2012  . Gout   . Gout 04/28/2012  . Headache(784.0)    CT head 08/2011: Periventricular and subcortical white matter hypodensities are most in keeping with chronic microangiopathic change  . HIV infection [facility]) Nov 2012   Followed by [name]  . Hyperlipidemia   . Hypertension   . Pulmonary embolism [facility])     Patient Active Problem List   Diagnosis Date Noted  . Type II diabetes mellitus with renal manifestations ([place]) 05/04/2018  . GERD (gastroesophageal reflux disease) 05/04/2018  . S/P CABG (coronary artery bypass graft)   . Left testicular pain   . Depression 02/20/2018  . Epididymo-orchitis, acute 02/20/2018  . Essential hypertension 02/20/2018  . Precordial chest pain 02/20/2018  . AKI (acute kidney injury) ([place]) 02/14/2018  . HCAP (healthcare-associated pneumonia) 02/14/2018  . History of pulmonary embolism 07/04/2017  . Acute hyponatremia 05/11/2017  . Hyperglycemia due to type 2 diabetes mellitus ([place]) 05/11/2017  . CHF (congestive heart failure) ([place]) 04/01/2017  . Hepatitis C 12/30/2016  . Chronic ischemic heart disease 11/12/2016  . Paroxysmal A-fib ([place]) 11/12/2016  . Back pain 04/18/2016  . S/P carotid endarterectomy 11/15/2015  . MDD (major depressive disorder), recurrent severe, without psychosis ([place]) 09/09/2015  . Cocaine-induced mood disorder ([place]) 08/14/2015  . Cocaine abuse with cocaine-induced mood disorder ([place]) 08/14/2015  . Gout 07/10/2015  . Acute renal failure superimposed on stage 3 chronic kidney disease ([place]) 03/06/2015  . Normocytic anemia 03/06/2015  .  Chronic kidney disease, stage III (moderate) ([place]) 03/06/2015  . Hypoglycemia   . Encounter for general adult medical examination with abnormal findings 02/09/2015  . Cocaine use disorder, severe, dependence ([place]) 12/13/2014  . Substance or medication-induced depressive disorder with onset during withdrawal ([place]) 12/13/2014  . Severe recurrent major depressive disorder with psychotic features ([place]) 12/12/2014  . Cervicalgia 06/28/2014  . Lumbar  radiculopathy, chronic 06/28/2014  . Asthma, chronic 02/03/2014  . 3-vessel CAD 06/24/2013  . ED (erectile dysfunction) of organic origin 07/07/2012  . Hypertension goal BP (blood pressure) < 140/80 04/29/2012  . Chondromalacia of left knee 03/19/2012  . Hyperlipidemia with target LDL less than 100 02/12/2012  . Fibromyalgia 02/12/2012  . HIV (human immunodeficiency virus infection) ([place]) 08/27/2011  . Uncontrolled type 2 diabetes with neuropathy ([place]) 10/17/2000    Past Surgical History:  Procedure Laterality Date  . BACK SURGERY     1988  . BOWEL RESECTION    . CARDIAC SURGERY    . CERVICAL SPINE SURGERY     " rods in my neck "  . CORONARY ARTERY BYPASS GRAFT    . CORONARY STENT PLACEMENT    . NM MYOCAR PERF WALL MOTION  12/27/2011   normal  . SPINE SURGERY          Home Medications    Prior to Admission medications   Medication Sig Start Date End Date Taking? Authorizing Provider  ACCU-CHEK SOFTCLIX LANCETS lancets USE TO CHECK BLOOD GLUCOSE LEVELS UP TO 4 TIMES DAILY AS DIRECTED 11/24/17   Lindell Spar I, NP  albuterol (PROAIR HFA) 108 (90 Base) MCG/ACT inhaler INHALE TWO PUFFS INTO THE LUNGS EVERY 6 (SIX) HOURS AS NEEDED FOR WHEEZING OR SHORTNESS OF BREATH. Patient taking differently: Inhale 2 puffs into the lungs every 6 (six) hours as needed for wheezing or shortness of breath.  11/24/17   Lindell Spar I, NP  allopurinol (ZYLOPRIM) 300 MG tablet Take 1 tablet (300 mg total) by mouth daily. For gout. 11/25/17   Lindell Spar I, NP  aspirin 81 MG chewable tablet Chew 81 mg by mouth daily.    [provider]  benzonatate (TESSALON PERLES) 100 MG capsule Take 1 capsule (100 mg total) by mouth 3 (three) times daily as needed for cough. FOR DISRUPTIVE DAY TIME COUGH Patient not taking: Reported on 08/26/2018 06/09/18   Kinnie Feil, PA-C  colchicine 0.6 MG tablet Take 1 tablet (0.6 mg total) by mouth daily. Patient not taking: Reported on 08/26/2018 02/27/18   Johnsie Cancel, NP  diltiazem (CARDIZEM CD) 120 MG 24 hr capsule Take 1 capsule (120 mg total) by mouth daily. For high blood pressure Patient not taking: Reported on 08/26/2018 08/04/18   [name], MD  emtricitabine-rilpivir-tenofovir AF (ODEFSEY) 200-25-25 MG TABS tablet Take 1 tablet by mouth daily with breakfast. For HIV infection 11/24/17   Lindell Spar I, NP  famotidine (PEPCID) 20 MG tablet Take 1 tablet (20 mg total) by mouth daily. For acid reflux Patient not taking: Reported on 08/26/2018 11/24/17   Lindell Spar I, NP  gabapentin (NEURONTIN) 400 MG capsule Take 1 capsule (400 mg total) by mouth 2 (two) times daily. For agitation/neuropathic pain Patient taking differently: Take 400 mg by mouth 2 (two) times daily as needed (For agitation/neuropathic pain).  11/24/17   Lindell Spar I, NP  hydrOXYzine (ATARAX/VISTARIL) 25 MG tablet Take 1 tablet (25 mg total) by mouth 3 (three) times daily as needed for anxiety. 11/24/17   [place],  Loleta Dicker, NP  insulin glargine (LANTUS) 100 UNIT/ML injection Inject 0.3 mLs (30 Units total) into the skin at bedtime. For diabetes management 08/04/18   [name], MD  insulin lispro (HUMALOG) 100 UNIT/ML injection Inject 0.05 mLs (5 Units total) into the skin 3 (three) times daily with meals. For diabetes management Patient not taking: Reported on 08/26/2018 11/24/17   Lindell Spar I, NP  Insulin Syringe-Needle U-100 (B-D INS SYR MICROFINE 1CC/27G) 27G X 5/8" 1 ML MISC 1 strip by Does not apply route 3 (three) times daily as needed. For blood sugar checks 11/24/17   Lindell Spar I, NP  isosorbide mononitrate (IMDUR) 30 MG 24 hr tablet Take 2 tablets (60 mg total) by mouth daily. For chest Patient not taking: Reported on 08/26/2018 08/04/18   [name], MD  metFORMIN (GLUCOPHAGE) 500 MG tablet Take 1 tablet (500 mg total) by mouth 2 (two) times daily with a meal. 08/04/18   [name], MD  pravastatin (PRAVACHOL) 40 MG tablet Take 1 tablet (40  mg total) by mouth at bedtime. For high cholesterol Patient not taking: Reported on 08/26/2018 11/24/17   Lindell Spar I, NP  predniSONE (STERAPRED UNI-PAK 21 TAB) 10 MG (21) TBPK tablet Take by mouth daily. Take 6 tabs by mouth daily  for 2 days, then 5 tabs for 2 days, then 4 tabs for 2 days, then 3 tabs for 2 days, 2 tabs for 2 days, then 1 tab by mouth daily for 2 days Patient not taking: Reported on 08/26/2018 08/04/18   [name], MD  risperiDONE (RISPERDAL) 0.5 MG tablet Take 0.5 mg by mouth daily. 06/16/17   [provider]  sertraline (ZOLOFT) 50 MG tablet Take 1 tablet (50 mg total) by mouth daily. For depression Patient not taking: Reported on 08/26/2018 11/25/17   Lindell Spar I, NP  traZODone (DESYREL) 50 MG tablet Take 1 tablet (50 mg total) by mouth at bedtime as needed for sleep. Patient not taking: Reported on 08/26/2018 11/24/17   Encarnacion Slates, NP    Family History Family History  Problem Relation Age of Onset  . Diabetes Mother   . Hypertension Mother   . Hyperlipidemia Mother   . Diabetes Father   . Cancer Father   . Hypertension Father   . Diabetes Brother   . Heart disease Brother   . Diabetes Sister   . Colon cancer Neg Hx     Social History Social History   Tobacco Use  . Smoking status: Never Smoker  . Smokeless tobacco: Never Used  Substance Use Topics  . Alcohol use: No    Alcohol/week: 4.0 standard drinks    Types: 2 Cans of beer, 2 Shots of liquor per week  . Drug use: Yes    Types: "Crack" cocaine, Cocaine    Comment: last use January 2018     Allergies   Patient has no known allergies.   Review of Systems Review of Systems  All other systems reviewed and are negative.    Physical Exam Updated Vital Signs BP (!) 169/106 (BP Location: Right Arm) Comment: patient moving during exam; will recheck  Pulse 93   Temp 98.8 F (37.1 C) (Oral)   Resp 19   SpO2 100%   Physical Exam  Nursing note and vitals reviewed.  67  year old male, resting comfortably and in no acute distress. Vital signs are significant for elevated blood pressure. Oxygen saturation is 100%, which is normal. Head  is normocephalic and atraumatic. PERRLA, EOMI. Oropharynx is clear. Neck is immobilized in a stiff cervical collar and has mild tenderness in the midline in the mid and lower cervical region.  There is no adenopathy or JVD. Back is nontender and there is no CVA tenderness. Lungs are clear without rales, wheezes, or rhonchi. Chest has moderate tenderness diffusely through the anterior chest wall.  There is no crepitus. Heart has regular rate and rhythm without murmur. Abdomen is soft, flat, nontender without masses or hepatosplenomegaly and peristalsis is normoactive. Pelvis is stable and nontender. Extremities have no cyanosis or edema, full range of motion is present. Skin is warm and dry without rash. Neurologic: Mental status is normal, cranial nerves are intact, there are no motor or sensory deficits.  ED Treatments / Results  Labs (all labs ordered are listed, but only abnormal results are displayed) Labs Reviewed  CBC WITH DIFFERENTIAL/PLATELET - Abnormal; Notable for the following components:      Result Value   Hemoglobin 11.1 (*)    HCT 36.4 (*)    MCH 25.0 (*)    All other components within normal limits  URINALYSIS, ROUTINE W REFLEX MICROSCOPIC - Abnormal; Notable for the following components:   Color, Urine STRAW (*)    Glucose, UA >=500 (*)    Hgb urine dipstick SMALL (*)    Protein, ur 100 (*)    All other components within normal limits  CBG MONITORING, ED - Abnormal; Notable for the following components:   Glucose-Capillary 425 (*)    All other components within normal limits  COMPREHENSIVE METABOLIC PANEL  ETHANOL  RAPID URINE DRUG SCREEN, HOSP PERFORMED  I-STAT TROPONIN, ED   Radiology No results found.  Procedures Procedures   Medications Ordered in ED Medications  ondansetron (ZOFRAN)  injection 4 mg (has no administration in time range)  morphine 4 MG/ML injection 4 mg (has no administration in time range)     Initial Impression / Assessment and Plan / ED Course  I have reviewed the triage vital signs and the nursing notes.  Pertinent labs & imaging results that were available during my care of the patient were reviewed by me and considered in my medical decision making (see chart for details).  Fall down steps with loss of consciousness and chest wall tenderness.  Will send for CT scans to evaluate for injury.  Will also need to check electrolytes and glucose.  He is given a dose of NovoLog insulin.  Old records are reviewed, and he had been in the ED 1 week ago with hyperglycemia.  Case is signed out to Dr. Dayna Barker.  Final Clinical Impressions(s) / ED Diagnoses   Final diagnoses:  Fall down steps, initial encounter  Hyperglycemia  Chest wall pain  Normocytic anemia    ED Discharge Orders    None       Delora Fuel, MD 60/10/93 [phone]

## 2018-09-02 NOTE — ED Notes (Signed)
Called pharmacy again.  Unable to pull insulin.  MD aware of insulin and pain.

## 2018-09-02 NOTE — ED Provider Notes (Signed)
7:05 AM Assumed care from Dr. Roxanne Mins, please see their note for full history, physical and decision making until this point. In brief this is a 67 y.o. year old male who presented to the ED tonight with Fall     Fall down steps with amnesia to event. Pending labs/ct/reeval and will likely discharge/needs insulin.   Ambulating without difficulty. Workup ok. Initially forgot to call in insulin, so I called pharmacy who will refill it if he is out but stated he just picked up 5 pens that should be 50 days worth on the 15th of this month. Patient updated via voicemail.   Discharge instructions, including strict return precautions for new or worsening symptoms, given. Patient and/or family verbalized understanding and agreement with the plan as described.   Labs, studies and imaging reviewed by myself and considered in medical decision making if ordered. Imaging interpreted by radiology.  Labs Reviewed  URINALYSIS, ROUTINE W REFLEX MICROSCOPIC - Abnormal; Notable for the following components:      Result Value   Color, Urine STRAW (*)    Glucose, UA >=500 (*)    Hgb urine dipstick SMALL (*)    Protein, ur 100 (*)    All other components within normal limits  CBG MONITORING, ED - Abnormal; Notable for the following components:   Glucose-Capillary 425 (*)    All other components within normal limits  COMPREHENSIVE METABOLIC PANEL  CBC WITH DIFFERENTIAL/PLATELET  ETHANOL  RAPID URINE DRUG SCREEN, HOSP PERFORMED  I-STAT TROPONIN, ED    CT Head Wo Contrast    (Results Pending)  CT Cervical Spine Wo Contrast    (Results Pending)  CT Chest W Contrast    (Results Pending)  CT ABDOMEN PELVIS W CONTRAST    (Results Pending)    No follow-ups on file.    Merrily Pew, MD 09/02/18 [phone]

## 2018-09-02 NOTE — ED Notes (Signed)
Ambulated in hall with no issue. Pt normally uses a cane at home. Pt states he is just tired and sore.

## 2018-09-02 NOTE — ED Triage Notes (Signed)
Patient BIB [facility] for [facility]. Patient states he fell down approx 8 stairs this morning, hitting his head with possible LOC. Patient c/o RIGHT sided rib pain. Denies dizziness, chest pain, abdominal pain, or dysuria. Patient states he is not on any blood thinners at this time. Per EMS patients CBG 467. Patient states he has been out of his insulin x 2 days.   BP 149/87 HR 92 95% RA

## 2018-09-04 ENCOUNTER — Other Ambulatory Visit: Payer: Self-pay | Admitting: *Deleted

## 2018-09-04 NOTE — Patient Outreach (Signed)
[facility]) Care Management  09/04/2018  Michael [name] 1951/09/15 [id number]   Telephone Screen  Referral Date:  09/04/2018 Referral Source:  [facility] ED Census Reason for Referral:  6 or more ED visits in the past 6 months Insurance:  [employer] Attempt:  Outreach attempt #1 to patient for telephone screening. No answer. RN Health Coach left HIPAA compliant voicemail message along with contact information.  Plan:  RN Health Coach will send unsuccessful outreach letter to patient.  RN Health Coach will make another outreach attempt to patient within 3-4 business days if no return call back from patient.   [place] [phone] [email or web address]

## 2018-09-07 ENCOUNTER — Other Ambulatory Visit: Payer: Self-pay | Admitting: *Deleted

## 2018-09-07 NOTE — Patient Outreach (Signed)
[facility]) Care Management  09/07/2018  Dina Mobley [birth year] [id number]   Telephone Screen  Referral Date:  09/04/2018 Referral Source:  [facility] ED Census Reason for Referral:  6 or more ED visits in the past 6 months Insurance:  [employer] Attempt:  Outreach attempt #2 to patient for telephone screening.  Line stated "the person you are calling can not accept calls at this time".  Unable to leave voice message.  Plan:  RN Health Coach will make another outreach attempt to patient within 3-4 business days if no return call back from patient.  [place] [phone] [email or web address]

## 2018-09-08 ENCOUNTER — Other Ambulatory Visit: Payer: Self-pay | Admitting: *Deleted

## 2018-09-08 NOTE — Patient Outreach (Signed)
[facility]) Care Management  09/08/2018  Takuya Lariccia May 24, 1951 [id number]   Telephone Screen  Referral Date:09/04/2018 Referral Source:[facility] ED Census Reason for Referral:6 or more ED visits in the past 6 months Insurance:[employer] Medicare   Outreach Attempt:  Outreach attempt #3 to patient for telephone screening.  Patient answered.  In process of verifying HIPAA, patient stated he was in the bank and requested to call [place] back.  Patient returned call and HIPAA verified.  [facility] services reviewed with patient and RN Health Coach discussed screening process.  Patient declined screening at this time and stated he would like to think it over and return call back once he was at home.  Plan:  RN Health Coach will await return call back from patient.  RN Health Coach will close case if no return call from patient within 10 day time period from Unsuccessful Letter being mailed to patient.  [place] [phone] [email or web address]

## 2018-09-16 ENCOUNTER — Encounter (HOSPITAL_COMMUNITY): Payer: Self-pay | Admitting: Emergency Medicine

## 2018-09-16 ENCOUNTER — Observation Stay (HOSPITAL_COMMUNITY)
Admission: EM | Admit: 2018-09-16 | Discharge: 2018-09-22 | Disposition: A | Discharge status: Home / Self Care | Payer: Medicare Other | Attending: Internal Medicine | Admitting: Internal Medicine

## 2018-09-16 ENCOUNTER — Other Ambulatory Visit: Payer: Self-pay

## 2018-09-16 ENCOUNTER — Emergency Department (HOSPITAL_COMMUNITY): Payer: Medicare Other

## 2018-09-16 DIAGNOSIS — Z79899 Other long term (current) drug therapy: Secondary | ICD-10-CM | POA: Diagnosis not present

## 2018-09-16 DIAGNOSIS — F1420 Cocaine dependence, uncomplicated: Secondary | ICD-10-CM | POA: Diagnosis present

## 2018-09-16 DIAGNOSIS — J40 Bronchitis, not specified as acute or chronic: Secondary | ICD-10-CM | POA: Diagnosis not present

## 2018-09-16 DIAGNOSIS — N183 Chronic kidney disease, stage 3 unspecified: Secondary | ICD-10-CM | POA: Diagnosis present

## 2018-09-16 DIAGNOSIS — Z743 Need for continuous supervision: Secondary | ICD-10-CM | POA: Diagnosis not present

## 2018-09-16 DIAGNOSIS — J209 Acute bronchitis, unspecified: Secondary | ICD-10-CM | POA: Diagnosis not present

## 2018-09-16 DIAGNOSIS — B20 Human immunodeficiency virus [HIV] disease: Secondary | ICD-10-CM | POA: Insufficient documentation

## 2018-09-16 DIAGNOSIS — Z7982 Long term (current) use of aspirin: Secondary | ICD-10-CM | POA: Insufficient documentation

## 2018-09-16 DIAGNOSIS — E1122 Type 2 diabetes mellitus with diabetic chronic kidney disease: Secondary | ICD-10-CM | POA: Insufficient documentation

## 2018-09-16 DIAGNOSIS — Z951 Presence of aortocoronary bypass graft: Secondary | ICD-10-CM | POA: Diagnosis not present

## 2018-09-16 DIAGNOSIS — F1410 Cocaine abuse, uncomplicated: Secondary | ICD-10-CM | POA: Diagnosis not present

## 2018-09-16 DIAGNOSIS — I10 Essential (primary) hypertension: Secondary | ICD-10-CM | POA: Diagnosis not present

## 2018-09-16 DIAGNOSIS — F332 Major depressive disorder, recurrent severe without psychotic features: Secondary | ICD-10-CM | POA: Diagnosis not present

## 2018-09-16 DIAGNOSIS — J4521 Mild intermittent asthma with (acute) exacerbation: Secondary | ICD-10-CM | POA: Insufficient documentation

## 2018-09-16 DIAGNOSIS — F331 Major depressive disorder, recurrent, moderate: Secondary | ICD-10-CM

## 2018-09-16 DIAGNOSIS — I129 Hypertensive chronic kidney disease with stage 1 through stage 4 chronic kidney disease, or unspecified chronic kidney disease: Secondary | ICD-10-CM | POA: Insufficient documentation

## 2018-09-16 DIAGNOSIS — E1142 Type 2 diabetes mellitus with diabetic polyneuropathy: Secondary | ICD-10-CM | POA: Diagnosis not present

## 2018-09-16 DIAGNOSIS — E1140 Type 2 diabetes mellitus with diabetic neuropathy, unspecified: Secondary | ICD-10-CM

## 2018-09-16 DIAGNOSIS — I2510 Atherosclerotic heart disease of native coronary artery without angina pectoris: Secondary | ICD-10-CM | POA: Insufficient documentation

## 2018-09-16 DIAGNOSIS — Z794 Long term (current) use of insulin: Secondary | ICD-10-CM | POA: Diagnosis not present

## 2018-09-16 DIAGNOSIS — Z21 Asymptomatic human immunodeficiency virus [HIV] infection status: Secondary | ICD-10-CM | POA: Diagnosis present

## 2018-09-16 DIAGNOSIS — J441 Chronic obstructive pulmonary disease with (acute) exacerbation: Secondary | ICD-10-CM

## 2018-09-16 DIAGNOSIS — E1165 Type 2 diabetes mellitus with hyperglycemia: Secondary | ICD-10-CM | POA: Diagnosis not present

## 2018-09-16 DIAGNOSIS — E440 Moderate protein-calorie malnutrition: Secondary | ICD-10-CM | POA: Diagnosis present

## 2018-09-16 DIAGNOSIS — J440 Chronic obstructive pulmonary disease with acute lower respiratory infection: Secondary | ICD-10-CM | POA: Diagnosis not present

## 2018-09-16 DIAGNOSIS — R05 Cough: Secondary | ICD-10-CM | POA: Diagnosis not present

## 2018-09-16 DIAGNOSIS — R45851 Suicidal ideations: Secondary | ICD-10-CM | POA: Insufficient documentation

## 2018-09-16 DIAGNOSIS — IMO0002 Reserved for concepts with insufficient information to code with codable children: Secondary | ICD-10-CM | POA: Diagnosis present

## 2018-09-16 DIAGNOSIS — R079 Chest pain, unspecified: Secondary | ICD-10-CM | POA: Diagnosis not present

## 2018-09-16 DIAGNOSIS — R0602 Shortness of breath: Secondary | ICD-10-CM | POA: Diagnosis not present

## 2018-09-16 DIAGNOSIS — R0789 Other chest pain: Secondary | ICD-10-CM | POA: Diagnosis not present

## 2018-09-16 LAB — CBC WITH DIFFERENTIAL/PLATELET
ABS IMMATURE GRANULOCYTES: 0.08 10*3/uL — AB (ref 0.00–0.07)
BASOS ABS: 0 10*3/uL (ref 0.0–0.1)
Basophils Relative: 1 %
EOS PCT: 2 %
Eosinophils Absolute: 0.1 10*3/uL (ref 0.0–0.5)
HEMATOCRIT: 34.2 % — AB (ref 39.0–52.0)
Hemoglobin: 10.1 g/dL — ABNORMAL LOW (ref 13.0–17.0)
IMMATURE GRANULOCYTES: 1 %
LYMPHS PCT: 29 %
Lymphs Abs: 1.7 10*3/uL (ref 0.7–4.0)
MCH: 24.6 pg — ABNORMAL LOW (ref 26.0–34.0)
MCHC: 29.5 g/dL — ABNORMAL LOW (ref 30.0–36.0)
MCV: 83.4 fL (ref 80.0–100.0)
MONOS PCT: 9 %
Monocytes Absolute: 0.5 10*3/uL (ref 0.1–1.0)
NRBC: 0 % (ref 0.0–0.2)
Neutro Abs: 3.4 10*3/uL (ref 1.7–7.7)
Neutrophils Relative %: 58 %
Platelets: 214 10*3/uL (ref 150–400)
RBC: 4.1 MIL/uL — ABNORMAL LOW (ref 4.22–5.81)
RDW: 13.7 % (ref 11.5–15.5)
WBC: 5.8 10*3/uL (ref 4.0–10.5)

## 2018-09-16 LAB — COMPREHENSIVE METABOLIC PANEL
ALK PHOS: 77 U/L (ref 38–126)
ALT: 13 U/L (ref 0–44)
AST: 18 U/L (ref 15–41)
Albumin: 3.3 g/dL — ABNORMAL LOW (ref 3.5–5.0)
Anion gap: 11 (ref 5–15)
BILIRUBIN TOTAL: 0.4 mg/dL (ref 0.3–1.2)
BUN: 20 mg/dL (ref 8–23)
CO2: 22 mmol/L (ref 22–32)
Calcium: 8.8 mg/dL — ABNORMAL LOW (ref 8.9–10.3)
Chloride: 103 mmol/L (ref 98–111)
Creatinine, Ser: 1.7 mg/dL — ABNORMAL HIGH (ref 0.61–1.24)
GFR, EST AFRICAN AMERICAN: 47 mL/min — AB (ref >60)
GFR, EST NON AFRICAN AMERICAN: 41 mL/min — AB (ref >60)
GLUCOSE: 389 mg/dL — AB (ref 70–99)
Potassium: 3.9 mmol/L (ref 3.5–5.1)
SODIUM: 136 mmol/L (ref 135–145)
TOTAL PROTEIN: 6.9 g/dL (ref 6.5–8.1)

## 2018-09-16 LAB — URINALYSIS, ROUTINE W REFLEX MICROSCOPIC
BILIRUBIN URINE: NEGATIVE
Bacteria, UA: NONE SEEN
Glucose, UA: >=500 mg/dL — AB
KETONES UR: NEGATIVE mg/dL
LEUKOCYTES UA: NEGATIVE
NITRITE: NEGATIVE
PH: 5 (ref 5.0–8.0)
Protein, ur: 100 mg/dL — AB
SPECIFIC GRAVITY, URINE: 1.027 (ref 1.005–1.030)

## 2018-09-16 LAB — RAPID URINE DRUG SCREEN, HOSP PERFORMED
AMPHETAMINES: NOT DETECTED
BARBITURATES: NOT DETECTED
Benzodiazepines: NOT DETECTED
COCAINE: POSITIVE — AB
Opiates: NOT DETECTED
Tetrahydrocannabinol: NOT DETECTED

## 2018-09-16 LAB — I-STAT TROPONIN, ED: Troponin i, poc: 0 ng/mL (ref 0.00–0.08)

## 2018-09-16 MED ORDER — INSULIN GLARGINE 100 UNIT/ML ~~LOC~~ SOLN
20.0000 [IU] | Freq: Every day | SUBCUTANEOUS | Status: DC
  Administered 2018-09-17: 20 [IU] via SUBCUTANEOUS
  Filled 2018-09-16: qty 0.2

## 2018-09-16 MED ORDER — INSULIN ASPART 100 UNIT/ML ~~LOC~~ SOLN
0.0000 [IU] | Freq: Three times a day (TID) | SUBCUTANEOUS | Status: DC
  Administered 2018-09-17: 15 [IU] via SUBCUTANEOUS
  Administered 2018-09-17: 11 [IU] via SUBCUTANEOUS
  Administered 2018-09-17: 8 [IU] via SUBCUTANEOUS
  Administered 2018-09-18: 3 [IU] via SUBCUTANEOUS
  Administered 2018-09-18: 8 [IU] via SUBCUTANEOUS
  Administered 2018-09-18: 2 [IU] via SUBCUTANEOUS
  Administered 2018-09-19: 3 [IU] via SUBCUTANEOUS

## 2018-09-16 MED ORDER — PREDNISONE 20 MG PO TABS
40.0000 mg | ORAL_TABLET | Freq: Every day | ORAL | Status: AC
  Administered 2018-09-17 – 2018-09-21 (×5): 40 mg via ORAL
  Filled 2018-09-16 (×5): qty 2

## 2018-09-16 MED ORDER — HYDROXYZINE HCL 25 MG PO TABS: 25.0000 mg | ORAL_TABLET | Freq: Three times a day (TID) | ORAL | Status: DC | PRN

## 2018-09-16 MED ORDER — ALLOPURINOL 300 MG PO TABS
300.0000 mg | ORAL_TABLET | Freq: Every day | ORAL | Status: DC
  Administered 2018-09-17 – 2018-09-22 (×6): 300 mg via ORAL
  Filled 2018-09-16 (×6): qty 1

## 2018-09-16 MED ORDER — IPRATROPIUM-ALBUTEROL 0.5-2.5 (3) MG/3ML IN SOLN
3.0000 mL | Freq: Once | RESPIRATORY_TRACT | Status: AC
  Administered 2018-09-16: 3 mL via RESPIRATORY_TRACT
  Filled 2018-09-16: qty 3

## 2018-09-16 MED ORDER — METHYLPREDNISOLONE SODIUM SUCC 125 MG IJ SOLR
125.0000 mg | Freq: Once | INTRAMUSCULAR | Status: AC
  Administered 2018-09-16: 125 mg via INTRAVENOUS
  Filled 2018-09-16: qty 2

## 2018-09-16 MED ORDER — EMTRICITAB-RILPIVIR-TENOFOV AF 200-25-25 MG PO TABS
1.0000 | ORAL_TABLET | Freq: Every day | ORAL | Status: DC
  Administered 2018-09-17 – 2018-09-22 (×6): 1 via ORAL
  Filled 2018-09-16 (×7): qty 1

## 2018-09-16 MED ORDER — ASPIRIN 81 MG PO CHEW
81.0000 mg | CHEWABLE_TABLET | Freq: Every day | ORAL | Status: DC
  Administered 2018-09-17 – 2018-09-22 (×6): 81 mg via ORAL
  Filled 2018-09-16 (×6): qty 1

## 2018-09-16 MED ORDER — GABAPENTIN 400 MG PO CAPS: 400.0000 mg | ORAL_CAPSULE | Freq: Two times a day (BID) | ORAL | Status: DC | PRN

## 2018-09-16 MED ORDER — ALBUTEROL SULFATE (2.5 MG/3ML) 0.083% IN NEBU
2.5000 mg | INHALATION_SOLUTION | RESPIRATORY_TRACT | Status: DC | PRN
  Administered 2018-09-22: 2.5 mg via RESPIRATORY_TRACT
  Filled 2018-09-16: qty 3

## 2018-09-16 MED ORDER — ENOXAPARIN SODIUM 40 MG/0.4ML ~~LOC~~ SOLN
40.0000 mg | Freq: Every day | SUBCUTANEOUS | Status: DC
  Administered 2018-09-17 – 2018-09-22 (×6): 40 mg via SUBCUTANEOUS
  Filled 2018-09-16 (×6): qty 0.4

## 2018-09-16 MED ORDER — MOMETASONE FURO-FORMOTEROL FUM 200-5 MCG/ACT IN AERO
1.0000 | INHALATION_SPRAY | Freq: Two times a day (BID) | RESPIRATORY_TRACT | Status: DC
  Administered 2018-09-17 – 2018-09-22 (×9): 1 via RESPIRATORY_TRACT
  Filled 2018-09-16 (×2): qty 8.8

## 2018-09-16 MED ORDER — RISPERIDONE 0.5 MG PO TABS
0.5000 mg | ORAL_TABLET | Freq: Every day | ORAL | Status: DC
  Administered 2018-09-17 – 2018-09-22 (×6): 0.5 mg via ORAL
  Filled 2018-09-16 (×6): qty 1

## 2018-09-16 NOTE — ED Triage Notes (Signed)
Pt here via [facility], c/o SOB since May after his bypass, worsening last 3 days, cough x 1 week, lungs clear per EMS.  R flank pain from recent fall and Chest tightness when coughing. A&O x4.

## 2018-09-16 NOTE — H&P (Signed)
History and Physical    Michael [name] IWL:[id number] DOB: Nov 29, 1950 DOA: 09/16/2018  PCP: [name], PA-C  Patient coming from: [place] have personally briefly reviewed patient's old medical records in [place]  Chief Complaint: SOB  HPI: Michael [name] is a 67 y.o. male with medical history significant of CAD S/P CABG, preserved EF as of July echo, HIV on HAART, ongoing cocaine abuse, HTN, DM2.  Patient presents to the ED with c/o SOB onset yesterday, worsening.  No fever, no headaches, no sore throat, no body aches, no leg swelling.  Has had cough, sputum production, wheezing, and CP.  Home albuterol in haler provides mild relief.   ED Course: Symptoms improved some with albuterol treatments and Solumedrol, but patient still has increased WOB.   Review of Systems: As per HPI otherwise 10 point review of systems negative.   Past Medical History:  Diagnosis Date  . A-fib ([place])   . Asthma    No PFTs, history of childhood asthma  . CAD (coronary artery disease)    a. 06/2013 STEMI/PCI ([facility]): LAD w/ thrombus (treated with BMS), mid 75%, D2 75%; LCX OM2 75%; RCA small, PDA 95%, PLV 95%;  b. 10/2013 Cath/PCI: ISR w/in LAD (Promus DES x 2), borderline OM2 lesion;  c. 01/2014 MV: Intermediate risk, medium-sized distal ant wall infarct w/ very small amt of peri-infarct ischemia. EF 60%.  . Cellulitis 04/2014   left facial  . Chondromalacia of medial femoral condyle    Left knee MRI 04/28/12: Chondromalacia of the medial femoral condyle with slight peripheral degeneration of the meniscocapsular junction of the medial meniscus; followed by sports medicine  . Collagen vascular disease ([place])   . Crack cocaine use    for 20+ years, has been enrolled in detox programs in the past  . Depression    with history of hospitalization for suicidal ideation  . Diabetes mellitus 2002   Diagnosed in 2002, started insulin in 2012  . Gout   . Gout 04/28/2012  .  Headache(784.0)    CT head 08/2011: Periventricular and subcortical white matter hypodensities are most in keeping with chronic microangiopathic change  . HIV infection [facility]) Nov 2012   Followed by [name]  . Hyperlipidemia   . Hypertension   . Pulmonary embolism [facility])     Past Surgical History:  Procedure Laterality Date  . BACK SURGERY     1988  . BOWEL RESECTION    . CARDIAC SURGERY    . CERVICAL SPINE SURGERY     " rods in my neck "  . CORONARY ARTERY BYPASS GRAFT    . CORONARY STENT PLACEMENT    . NM MYOCAR PERF WALL MOTION  12/27/2011   normal  . SPINE SURGERY       reports that he has never smoked. He has never used smokeless tobacco. He reports that he has current or past drug history. Drugs: "Crack" cocaine and Cocaine. He reports that he does not drink alcohol.  No Known Allergies  Family History  Problem Relation Age of Onset  . Diabetes Mother   . Hypertension Mother   . Hyperlipidemia Mother   . Diabetes Father   . Cancer Father   . Hypertension Father   . Diabetes Brother   . Heart disease Brother   . Diabetes Sister   . Colon cancer Neg Hx      Prior to Admission medications   Medication Sig Start Date End Date  Taking? Authorizing Provider  albuterol (PROAIR HFA) 108 (90 Base) MCG/ACT inhaler INHALE TWO PUFFS INTO THE LUNGS EVERY 6 (SIX) HOURS AS NEEDED FOR WHEEZING OR SHORTNESS OF BREATH. Patient taking differently: Inhale 2 puffs into the lungs every 6 (six) hours as needed for wheezing or shortness of breath.  11/24/17  Yes Lindell Spar I, NP  allopurinol (ZYLOPRIM) 300 MG tablet Take 1 tablet (300 mg total) by mouth daily. For gout. 11/25/17  Yes Lindell Spar I, NP  aspirin 81 MG chewable tablet Chew 81 mg by mouth daily.   Yes [provider]  emtricitabine-rilpivir-tenofovir AF (ODEFSEY) 200-25-25 MG TABS tablet Take 1 tablet by mouth daily with breakfast. For HIV infection 11/24/17  Yes Nwoko, Herbert Pun I, NP  gabapentin (NEURONTIN) 400 MG  capsule Take 1 capsule (400 mg total) by mouth 2 (two) times daily. For agitation/neuropathic pain Patient taking differently: Take 400 mg by mouth 2 (two) times daily as needed (For agitation/neuropathic pain).  11/24/17  Yes Lindell Spar I, NP  hydrOXYzine (ATARAX/VISTARIL) 25 MG tablet Take 1 tablet (25 mg total) by mouth 3 (three) times daily as needed for anxiety. 11/24/17  Yes Lindell Spar I, NP  insulin glargine (LANTUS) 100 UNIT/ML injection Inject 0.3 mLs (30 Units total) into the skin at bedtime. For diabetes management 08/04/18  Yes [name], MD  metFORMIN (GLUCOPHAGE) 500 MG tablet Take 1 tablet (500 mg total) by mouth 2 (two) times daily with a meal. 08/04/18  Yes [name], MD  risperiDONE (RISPERDAL) 0.5 MG tablet Take 0.5 mg by mouth daily. 06/16/17  Yes [provider]  ACCU-CHEK SOFTCLIX LANCETS lancets USE TO CHECK BLOOD GLUCOSE LEVELS UP TO 4 TIMES DAILY AS DIRECTED 11/24/17   Lindell Spar I, NP  insulin lispro (HUMALOG) 100 UNIT/ML injection Inject 0.05 mLs (5 Units total) into the skin 3 (three) times daily with meals. For diabetes management Patient not taking: Reported on 08/26/2018 11/24/17   Lindell Spar I, NP  Insulin Syringe-Needle U-100 (B-D INS SYR MICROFINE 1CC/27G) 27G X 5/8" 1 ML MISC 1 strip by Does not apply route 3 (three) times daily as needed. For blood sugar checks 11/24/17   Lindell Spar I, NP    Physical Exam: Vitals:   09/16/18 2021 09/16/18 2045 09/16/18 2115 09/16/18 2200  BP:  (!) 147/80 136/77 (!) 141/86  Pulse:  94 92 96  Resp:  18 15 20   Temp:      SpO2:  100% 100% 100%  Weight: 74.4 kg     Height: 5\' 8"  (1.727 m)       Constitutional: NAD, calm, comfortable Eyes: PERRL, lids and conjunctivae normal ENMT: Mucous membranes are moist. Posterior pharynx clear of any exudate or lesions.Normal dentition.  Neck: normal, supple, no masses, no thyromegaly Respiratory: Increased WOB, some wheezes. Cardiovascular: Regular rate and  rhythm, no murmurs / rubs / gallops. No extremity edema. 2+ pedal pulses. No carotid bruits.  Abdomen: no tenderness, no masses palpated. No hepatosplenomegaly. Bowel sounds positive.  Musculoskeletal: no clubbing / cyanosis. No joint deformity upper and lower extremities. Good ROM, no contractures. Normal muscle tone.  Skin: no rashes, lesions, ulcers. No induration Neurologic: CN 2-12 grossly intact. Sensation intact, DTR normal. Strength 5/5 in all 4.  Psychiatric: Normal judgment and insight. Alert and oriented x 3. Normal mood.    Labs on Admission: I have personally reviewed following labs and imaging studies  CBC: Recent Labs  Lab 09/16/18 2029  WBC 5.8  NEUTROABS 3.4  HGB 10.1*  HCT 34.2*  MCV 83.4  PLT 532   Basic Metabolic Panel: Recent Labs  Lab 09/16/18 2029  NA 136  K 3.9  CL 103  CO2 22  GLUCOSE 389*  BUN 20  CREATININE 1.70*  CALCIUM 8.8*   GFR: Estimated Creatinine Clearance: 40.2 mL/min (A) (by C-G formula based on SCr of 1.7 mg/dL (H)). Liver Function Tests: Recent Labs  Lab 09/16/18 2029  AST 18  ALT 13  ALKPHOS 77  BILITOT 0.4  PROT 6.9  ALBUMIN 3.3*   No results for input(s): LIPASE, AMYLASE in the last 168 hours. No results for input(s): AMMONIA in the last 168 hours. Coagulation Profile: No results for input(s): INR, PROTIME in the last 168 hours. Cardiac Enzymes: No results for input(s): CKTOTAL, CKMB, CKMBINDEX, TROPONINI in the last 168 hours. BNP (last 3 results) No results for input(s): PROBNP in the last 8760 hours. HbA1C: No results for input(s): HGBA1C in the last 72 hours. CBG: No results for input(s): GLUCAP in the last 168 hours. Lipid Profile: No results for input(s): CHOL, HDL, LDLCALC, TRIG, CHOLHDL, LDLDIRECT in the last 72 hours. Thyroid Function Tests: No results for input(s): TSH, T4TOTAL, FREET4, T3FREE, THYROIDAB in the last 72 hours. Anemia Panel: No results for input(s): VITAMINB12, FOLATE, FERRITIN, TIBC,  IRON, RETICCTPCT in the last 72 hours. Urine analysis:    Component Value Date/Time   COLORURINE YELLOW 09/16/2018 2035   APPEARANCEUR CLEAR 09/16/2018 2035   LABSPEC 1.027 09/16/2018 2035   PHURINE 5.0 09/16/2018 2035   GLUCOSEU >=500 (A) 09/16/2018 2035   HGBUR SMALL (A) 09/16/2018 2035   BILIRUBINUR NEGATIVE 09/16/2018 2035   KETONESUR NEGATIVE 09/16/2018 2035   PROTEINUR 100 (A) 09/16/2018 2035   UROBILINOGEN 0.2 08/13/2015 1030   NITRITE NEGATIVE 09/16/2018 2035   LEUKOCYTESUR NEGATIVE 09/16/2018 2035    Radiological Exams on Admission: Dg Chest 2 View  Result Date: 09/16/2018 CLINICAL DATA:  Mildly productive cough. Shortness of breath. EXAM: CHEST - 2 VIEW COMPARISON:  06/08/2018 and chest CT dated 09/02/2018. FINDINGS: Normal sized heart. Tortuous aorta. Stable left atrial appendage clip and coronary artery stent. Post CABG changes are again demonstrated. Stable small amount of linear scarring in the lingula or right middle lobe and small amount of linear scarring in the right perihilar region. Cervical spine fixation hardware. IMPRESSION: No acute abnormality. Electronically Signed   By: Claudie Revering M.D.   On: 09/16/2018 21:21    EKG: Independently reviewed.  Assessment/Plan Principal Problem:   Acute bronchitis Active Problems:   HIV (human immunodeficiency virus infection) ([place])   Uncontrolled type 2 diabetes with neuropathy ([name])   Cocaine use disorder, severe, dependence ([place])   Essential hypertension   S/P CABG (coronary artery bypass graft)   Chronic kidney disease, stage III (moderate) ([place])    1. Acute bronchitis - 1. H/o asthma, no h/o COPD, non-smoker 2. Will treat on COPD pathway since steroids and neb treatment seems to have majorly improved symptoms in ED. 3. Prednisone 4. Scheduled duoneb 5. PRN albuterol 6. Cont pulse ox 7. Checking D.Dimer - VQ scan if positive given h/o PE previously. 2. HIV - 1. Continue HAART 2. Checking CD4 count and  viral load since it looks like its been a year, was over 600 in Jan 3. CAD s/p CABG - 1. Preserved EF as of July 2. Cont ASA 4. DM2 - 1. Lantus 20 QHS 2. Resistant scale SSI AC 5. HTN - continue home BP meds  DVT prophylaxis:  Lovenox Code Status: Full Family Communication: No family in room Disposition Plan: Home after admit Consults called: None Admission status: Place in obs    Tyshawna Alarid, [facility] Pager [phone] Only works nights!  If 7AM-7PM, please contact the primary day team physician taking care of patient  [email or web address] Password [email or web address]  09/16/2018, 10:46 PM

## 2018-09-16 NOTE — ED Notes (Signed)
Patient transported to X-ray 

## 2018-09-16 NOTE — ED Provider Notes (Signed)
[facility] EMERGENCY DEPARTMENT Provider Note   CSN: [id number] Arrival date & time: 09/16/18  2009     History   Chief Complaint Chief Complaint  Patient presents with  . Shortness of Breath    HPI Michael [name] is a 67 y.o. male.  The history is provided by the patient.  Shortness of Breath  This is a recurrent problem. The problem occurs continuously.The current episode started yesterday. The problem has not changed since onset.Associated symptoms include cough, sputum production, wheezing and chest pain. Pertinent negatives include no fever, no headaches, no coryza, no rhinorrhea, no sore throat, no swollen glands, no ear pain, no neck pain, no PND, no orthopnea, no vomiting, no abdominal pain, no rash, no leg pain, no leg swelling and no claudication. It is unknown what precipitated the problem. He has tried beta-agonist inhalers for the symptoms. The treatment provided mild relief. He has had prior hospitalizations. He has had prior ED visits. Associated medical issues include COPD and PE.    Past Medical History:  Diagnosis Date  . A-fib ([place])   . Asthma    No PFTs, history of childhood asthma  . CAD (coronary artery disease)    a. 06/2013 STEMI/PCI ([facility]): LAD w/ thrombus (treated with BMS), mid 75%, D2 75%; LCX OM2 75%; RCA small, PDA 95%, PLV 95%;  b. 10/2013 Cath/PCI: ISR w/in LAD (Promus DES x 2), borderline OM2 lesion;  c. 01/2014 MV: Intermediate risk, medium-sized distal ant wall infarct w/ very small amt of peri-infarct ischemia. EF 60%.  . Cellulitis 04/2014   left facial  . Chondromalacia of medial femoral condyle    Left knee MRI 04/28/12: Chondromalacia of the medial femoral condyle with slight peripheral degeneration of the meniscocapsular junction of the medial meniscus; followed by sports medicine  . Collagen vascular disease ([place])   . Crack cocaine use    for 20+ years, has been enrolled in detox programs in the past  . Depression    with history of hospitalization for suicidal ideation  . Diabetes mellitus 2002   Diagnosed in 2002, started insulin in 2012  . Gout   . Gout 04/28/2012  . Headache(784.0)    CT head 08/2011: Periventricular and subcortical white matter hypodensities are most in keeping with chronic microangiopathic change  . HIV infection [facility]) Nov 2012   Followed by [name]  . Hyperlipidemia   . Hypertension   . Pulmonary embolism [facility])     Patient Active Problem List   Diagnosis Date Noted  . Type II diabetes mellitus with renal manifestations ([place]) 05/04/2018  . GERD (gastroesophageal reflux disease) 05/04/2018  . S/P CABG (coronary artery bypass graft)   . Left testicular pain   . Depression 02/20/2018  . Epididymo-orchitis, acute 02/20/2018  . Essential hypertension 02/20/2018  . Precordial chest pain 02/20/2018  . AKI (acute kidney injury) ([place]) 02/14/2018  . HCAP (healthcare-associated pneumonia) 02/14/2018  . History of pulmonary embolism 07/04/2017  . Acute hyponatremia 05/11/2017  . Hyperglycemia due to type 2 diabetes mellitus ([place]) 05/11/2017  . CHF (congestive heart failure) ([place]) 04/01/2017  . Hepatitis C 12/30/2016  . Chronic ischemic heart disease 11/12/2016  . Paroxysmal A-fib ([place]) 11/12/2016  . Back pain 04/18/2016  . S/P carotid endarterectomy 11/15/2015  . MDD (major depressive disorder), recurrent severe, without psychosis ([place]) 09/09/2015  . Cocaine-induced mood disorder ([place]) 08/14/2015  . Cocaine abuse with cocaine-induced mood disorder ([place]) 08/14/2015  . Gout 07/10/2015  . Acute renal failure superimposed  on stage 3 chronic kidney disease ([place]) 03/06/2015  . Normocytic anemia 03/06/2015  . Chronic kidney disease, stage III (moderate) ([place]) 03/06/2015  . Hypoglycemia   . Encounter for general adult medical examination with abnormal findings 02/09/2015  . Cocaine use disorder, severe, dependence ([place]) 12/13/2014  . Substance or medication-induced depressive  disorder with onset during withdrawal ([place]) 12/13/2014  . Severe recurrent major depressive disorder with psychotic features ([place]) 12/12/2014  . Cervicalgia 06/28/2014  . Lumbar radiculopathy, chronic 06/28/2014  . Asthma, chronic 02/03/2014  . 3-vessel CAD 06/24/2013  . ED (erectile dysfunction) of organic origin 07/07/2012  . Hypertension goal BP (blood pressure) < 140/80 04/29/2012  . Chondromalacia of left knee 03/19/2012  . Hyperlipidemia with target LDL less than 100 02/12/2012  . Fibromyalgia 02/12/2012  . HIV (human immunodeficiency virus infection) ([place]) 08/27/2011  . Uncontrolled type 2 diabetes with neuropathy ([place]) 10/17/2000    Past Surgical History:  Procedure Laterality Date  . BACK SURGERY     1988  . BOWEL RESECTION    . CARDIAC SURGERY    . CERVICAL SPINE SURGERY     " rods in my neck "  . CORONARY ARTERY BYPASS GRAFT    . CORONARY STENT PLACEMENT    . NM MYOCAR PERF WALL MOTION  12/27/2011   normal  . SPINE SURGERY          Home Medications    Prior to Admission medications   Medication Sig Start Date End Date Taking? Authorizing Provider  albuterol (PROAIR HFA) 108 (90 Base) MCG/ACT inhaler INHALE TWO PUFFS INTO THE LUNGS EVERY 6 (SIX) HOURS AS NEEDED FOR WHEEZING OR SHORTNESS OF BREATH. Patient taking differently: Inhale 2 puffs into the lungs every 6 (six) hours as needed for wheezing or shortness of breath.  11/24/17  Yes Lindell Spar I, NP  allopurinol (ZYLOPRIM) 300 MG tablet Take 1 tablet (300 mg total) by mouth daily. For gout. 11/25/17  Yes Lindell Spar I, NP  aspirin 81 MG chewable tablet Chew 81 mg by mouth daily.   Yes [provider]  emtricitabine-rilpivir-tenofovir AF (ODEFSEY) 200-25-25 MG TABS tablet Take 1 tablet by mouth daily with breakfast. For HIV infection 11/24/17  Yes Nwoko, Herbert Pun I, NP  gabapentin (NEURONTIN) 400 MG capsule Take 1 capsule (400 mg total) by mouth 2 (two) times daily. For agitation/neuropathic pain Patient  taking differently: Take 400 mg by mouth 2 (two) times daily as needed (For agitation/neuropathic pain).  11/24/17  Yes Lindell Spar I, NP  hydrOXYzine (ATARAX/VISTARIL) 25 MG tablet Take 1 tablet (25 mg total) by mouth 3 (three) times daily as needed for anxiety. 11/24/17  Yes Lindell Spar I, NP  insulin glargine (LANTUS) 100 UNIT/ML injection Inject 0.3 mLs (30 Units total) into the skin at bedtime. For diabetes management 08/04/18  Yes [name], MD  metFORMIN (GLUCOPHAGE) 500 MG tablet Take 1 tablet (500 mg total) by mouth 2 (two) times daily with a meal. 08/04/18  Yes [name], MD  risperiDONE (RISPERDAL) 0.5 MG tablet Take 0.5 mg by mouth daily. 06/16/17  Yes [provider]  ACCU-CHEK SOFTCLIX LANCETS lancets USE TO CHECK BLOOD GLUCOSE LEVELS UP TO 4 TIMES DAILY AS DIRECTED 11/24/17   Lindell Spar I, NP  insulin lispro (HUMALOG) 100 UNIT/ML injection Inject 0.05 mLs (5 Units total) into the skin 3 (three) times daily with meals. For diabetes management Patient not taking: Reported on 08/26/2018 11/24/17   Lindell Spar I, NP  Insulin Syringe-Needle U-100 (  B-D INS SYR MICROFINE 1CC/27G) 27G X 5/8" 1 ML MISC 1 strip by Does not apply route 3 (three) times daily as needed. For blood sugar checks 11/24/17   Encarnacion Slates, NP    Family History Family History  Problem Relation Age of Onset  . Diabetes Mother   . Hypertension Mother   . Hyperlipidemia Mother   . Diabetes Father   . Cancer Father   . Hypertension Father   . Diabetes Brother   . Heart disease Brother   . Diabetes Sister   . Colon cancer Neg Hx     Social History Social History   Tobacco Use  . Smoking status: Never Smoker  . Smokeless tobacco: Never Used  Substance Use Topics  . Alcohol use: No    Alcohol/week: 4.0 standard drinks    Types: 2 Cans of beer, 2 Shots of liquor per week  . Drug use: Not Currently    Types: "Crack" cocaine, Cocaine    Comment: last use January 2018      Allergies   Patient has no known allergies.   Review of Systems Review of Systems  Constitutional: Negative for chills and fever.  HENT: Negative for ear pain, rhinorrhea and sore throat.   Eyes: Negative for pain and visual disturbance.  Respiratory: Positive for cough, sputum production, shortness of breath and wheezing.   Cardiovascular: Positive for chest pain. Negative for palpitations, orthopnea, claudication, leg swelling and PND.  Gastrointestinal: Negative for abdominal pain and vomiting.  Genitourinary: Negative for dysuria and hematuria.  Musculoskeletal: Negative for arthralgias, back pain and neck pain.  Skin: Negative for color change and rash.  Neurological: Negative for seizures, syncope and headaches.  All other systems reviewed and are negative.    Physical Exam Updated Vital Signs  ED Triage Vitals  Enc Vitals Group     BP 09/16/18 2018 (!) 143/82     Pulse Rate 09/16/18 2018 98     Resp 09/16/18 2018 19     Temp 09/16/18 2020 98.5 F (36.9 C)     Temp src --      SpO2 09/16/18 2010 100 %     Weight 09/16/18 2021 164 lb (74.4 kg)     Height 09/16/18 2021 5\' 8"  (1.727 m)     Head Circumference --      Peak Flow --      Pain Score 09/16/18 2020 9     Pain Loc --      Pain Edu? --      Excl. in [place]? --     Physical Exam  Constitutional: He appears well-developed and well-nourished. He does not appear ill.  HENT:  Head: Normocephalic and atraumatic.  Mouth/Throat: No oropharyngeal exudate or posterior oropharyngeal edema.  Eyes: Pupils are equal, round, and reactive to light. Conjunctivae and EOM are normal.  Neck: Normal range of motion. Neck supple.  Cardiovascular: Normal rate, regular rhythm, normal heart sounds and intact distal pulses.  No murmur heard. Pulmonary/Chest: Tachypnea noted. No respiratory distress. He has no decreased breath sounds. He has wheezes. He has no rhonchi. He has no rales.  Abdominal: Soft. There is no tenderness.   Musculoskeletal: He exhibits no edema.       Right lower leg: He exhibits no edema.       Left lower leg: He exhibits no edema.  Neurological: He is alert.  Skin: Skin is warm and dry. Capillary refill takes less than 2 seconds.  Psychiatric: He has  a normal mood and affect.  Nursing note and vitals reviewed.    ED Treatments / Results  Labs (all labs ordered are listed, but only abnormal results are displayed) Labs Reviewed  COMPREHENSIVE METABOLIC PANEL - Abnormal; Notable for the following components:      Result Value   Glucose, Bld 389 (*)    Creatinine, Ser 1.70 (*)    Calcium 8.8 (*)    Albumin 3.3 (*)    GFR calc non Af Amer 41 (*)    GFR calc Af Amer 47 (*)    All other components within normal limits  CBC WITH DIFFERENTIAL/PLATELET - Abnormal; Notable for the following components:   RBC 4.10 (*)    Hemoglobin 10.1 (*)    HCT 34.2 (*)    MCH 24.6 (*)    MCHC 29.5 (*)    Abs Immature Granulocytes 0.08 (*)    All other components within normal limits  URINALYSIS, ROUTINE W REFLEX MICROSCOPIC - Abnormal; Notable for the following components:   Glucose, UA >=500 (*)    Hgb urine dipstick SMALL (*)    Protein, ur 100 (*)    All other components within normal limits  RAPID URINE DRUG SCREEN, HOSP PERFORMED  I-STAT TROPONIN, ED    EKG EKG Interpretation  Date/Time:  Wednesday September 16 2018 20:19:04 EST Ventricular Rate:  100 PR Interval:    QRS Duration: 87 QT Interval:  320 QTC Calculation: 413 R Axis:   78 Text Interpretation:  Sinus tachycardia Consider left atrial enlargement Confirmed by Lennice Sites [phone]) on 09/16/2018 9:31:40 PM   Radiology Dg Chest 2 View  Result Date: 09/16/2018 CLINICAL DATA:  Mildly productive cough. Shortness of breath. EXAM: CHEST - 2 VIEW COMPARISON:  06/08/2018 and chest CT dated 09/02/2018. FINDINGS: Normal sized heart. Tortuous aorta. Stable left atrial appendage clip and coronary artery stent. Post CABG changes are  again demonstrated. Stable small amount of linear scarring in the lingula or right middle lobe and small amount of linear scarring in the right perihilar region. Cervical spine fixation hardware. IMPRESSION: No acute abnormality. Electronically Signed   By: Claudie Revering M.D.   On: 09/16/2018 21:21    Procedures Procedures (including critical care time)  Medications Ordered in ED Medications  ipratropium-albuterol (DUONEB) 0.5-2.5 (3) MG/3ML nebulizer solution 3 mL (3 mLs Nebulization Given 09/16/18 2112)  methylPREDNISolone sodium succinate (SOLU-MEDROL) 125 mg/2 mL injection 125 mg (125 mg Intravenous Given 09/16/18 2112)  ipratropium-albuterol (DUONEB) 0.5-2.5 (3) MG/3ML nebulizer solution 3 mL (3 mLs Nebulization Given 09/16/18 2205)     Initial Impression / Assessment and Plan / ED Course  I have reviewed the triage vital signs and the nursing notes.  Pertinent labs & imaging results that were available during my care of the patient were reviewed by me and considered in my medical decision making (see chart for details).     Boston Cookson is a 67 year old male with history of coronary artery disease, asthma, paroxysmal A. fib, high cholesterol, hypertension, HIV, diabetes, prior cocaine use who presents to the ED with shortness of breath.  Patient with normal vitals.  No fever.  Patient with wheezing, increased work of breathing on exam.  Concern for reactive airway disease.  Patient has had some chest pain, cough, sputum production.  Patient lives at homeless shelter.  Has not had access to some of his medications.  Patient has uses inhaler with some relief.  Patient with no signs of volume overload on exam.  Has  a history of PE but is not on a blood thinner.  He is not hypoxic.  Patient intermittently tachycardic up and on my evaluation.  EKG shows sinus rhythm with no signs of ischemic changes.  Troponin within normal limits.  Doubt cardiac process.  Chest x-ray showed no signs of  pneumonia, pneumothorax, pleural effusion.  Patient with no significant anemia, electrolyte abnormality, kidney injury.  Creatinine at baseline.  No leukocytosis.  Patient with negative urinalysis.  Patient given duo nebs, Solu-Medrol with improvement of symptoms.    Re-evaluated the patient and still with increased work of breathing, wheezing.  Patient has unreliable housing situation and possible difficult access to medications.  Believe that he would benefit from observation stay for further breathing treatments, steroids.  Lower concern for PE at this time.  Has had V/Q scans in the past given kidney problems but suspect that today patient with symptoms secondary to reactive airway disease likely from a viral process.  Patient to be admitted to medicine service for further care.  Hemodynamically stable throughout my care.  This chart was dictated using voice recognition software.  Despite best efforts to proofread,  errors can occur which can change the documentation meaning.   Final Clinical Impressions(s) / ED Diagnoses   Final diagnoses:  Mild intermittent reactive airway disease with acute exacerbation    ED Discharge Orders    None       Lennice Sites, DO 09/16/18 2227

## 2018-09-16 NOTE — ED Notes (Signed)
Pt aware urine sample is needed and was given a urinal.

## 2018-09-17 ENCOUNTER — Other Ambulatory Visit: Payer: Self-pay | Admitting: *Deleted

## 2018-09-17 DIAGNOSIS — F1420 Cocaine dependence, uncomplicated: Secondary | ICD-10-CM

## 2018-09-17 DIAGNOSIS — B20 Human immunodeficiency virus [HIV] disease: Secondary | ICD-10-CM | POA: Diagnosis not present

## 2018-09-17 DIAGNOSIS — F332 Major depressive disorder, recurrent severe without psychotic features: Secondary | ICD-10-CM | POA: Diagnosis not present

## 2018-09-17 LAB — GLUCOSE, CAPILLARY
GLUCOSE-CAPILLARY: 527 mg/dL — AB (ref 70–99)
Glucose-Capillary: 252 mg/dL — ABNORMAL HIGH (ref 70–99)
Glucose-Capillary: 254 mg/dL — ABNORMAL HIGH (ref 70–99)
Glucose-Capillary: 302 mg/dL — ABNORMAL HIGH (ref 70–99)
Glucose-Capillary: 437 mg/dL — ABNORMAL HIGH (ref 70–99)
Glucose-Capillary: 497 mg/dL — ABNORMAL HIGH (ref 70–99)
Glucose-Capillary: 543 mg/dL (ref 70–99)

## 2018-09-17 LAB — T-HELPER CELLS (CD4) COUNT (NOT AT ARMC)
CD4 % Helper T Cell: 22 % — ABNORMAL LOW (ref 33–55)
CD4 T Cell Abs: 270 /uL — ABNORMAL LOW (ref 400–2700)

## 2018-09-17 LAB — D-DIMER, QUANTITATIVE: D-Dimer, Quant: 0.33 ug/mL-FEU (ref 0.00–0.50)

## 2018-09-17 MED ORDER — SERTRALINE HCL 50 MG PO TABS
50.0000 mg | ORAL_TABLET | Freq: Every day | ORAL | Status: DC
  Administered 2018-09-17 – 2018-09-22 (×6): 50 mg via ORAL
  Filled 2018-09-17 (×6): qty 1

## 2018-09-17 MED ORDER — INSULIN ASPART 100 UNIT/ML ~~LOC~~ SOLN
7.0000 [IU] | Freq: Three times a day (TID) | SUBCUTANEOUS | Status: DC
  Administered 2018-09-17 – 2018-09-22 (×15): 7 [IU] via SUBCUTANEOUS

## 2018-09-17 MED ORDER — TRAZODONE HCL 50 MG PO TABS
50.0000 mg | ORAL_TABLET | Freq: Every day | ORAL | Status: DC
  Administered 2018-09-17 – 2018-09-21 (×5): 50 mg via ORAL
  Filled 2018-09-17 (×5): qty 1

## 2018-09-17 MED ORDER — INSULIN ASPART 100 UNIT/ML ~~LOC~~ SOLN
15.0000 [IU] | Freq: Once | SUBCUTANEOUS | Status: AC
  Administered 2018-09-17: 15 [IU] via SUBCUTANEOUS

## 2018-09-17 MED ORDER — INSULIN GLARGINE 100 UNIT/ML ~~LOC~~ SOLN
25.0000 [IU] | Freq: Every day | SUBCUTANEOUS | Status: DC
  Administered 2018-09-17 – 2018-09-21 (×5): 25 [IU] via SUBCUTANEOUS
  Filled 2018-09-17 (×7): qty 0.25

## 2018-09-17 NOTE — Consult Note (Signed)
   [facility] Inpatient Consult   09/17/2018  Daymon Hora 07-28-1951 [id number]   Informed of patient's hospitalization by [place]. Multiple outreach attempts have been made by [place] without success. Request made for hospital liaison to discuss [place] Management engagement at bedside.  Went to bedside to speak with Mr. Kocak about [place] Management program. He is agreeable and [facility] written consent obtained. [facility] folder provided.  Mr. Genova endorses that he lives at the [facility]. He is homeless and has been awaiting housing placement over the past 1.5 months. States he is able to return to the [name]. States " they know I am here.".   Mr. Beville confirms Primary MD is Dr. Altamease Oiler.  He also states he has trouble with transportation to MD appointments and could benefit from transportation assistance.  Mr. Escamilla denies having concerns with obtaining medications. States he obtains his medications thru [facility] order pharmacy.  Confirms best contact number for Mr. Gafford as [phone].   Mr. Cordner states he is interested in community resources regarding substance abuse. He states " I can use all the help I can get."  Discussed [facility] Care Management follow up for DM management with [facility] RNCM and [facility] LCSW follow up for transportation and community resources regarding substance abuse. Mr. Katt agreeable to referrals.  Discussed all of the above with inpatient RNCM. Inpatient LCSW referral is pending for substance abuse counseling. Psych consult is also pending for suicidal ideations.  Will make referral to [place] and [facility] LCSW.   Marthenia Rolling, MSN-Ed, RN,BSN [facility] Liaison [phone]

## 2018-09-17 NOTE — Patient Outreach (Signed)
[facility]) Care Management  09/17/2018  Raidyn Wassink [birth year] [id number]   Telephone Screen  Referral Date:09/04/2018 Referral Source:[facility] ED Census Reason for Referral:6 or more ED visits in the past 6 months Insurance:[employer] Medicare   Outreach Attempt:  Received notification patient hospitalized at [facility] has been trying to outreach patient for screening and offering of services.  Notified [facility] Liaison of patient's admission to hospital and requested to offer services.  Plan:  RN Health Coach was notified patient was agreeable to [place] Nurse and [facility] CSW referrals.  RN Health Coach will resolve episode and remove health coach name from treatment team.  [place] [phone] [email or web address]

## 2018-09-17 NOTE — Evaluation (Signed)
Physical Therapy Evaluation Patient Details Name: Michael [name] MRN: [id number] DOB: [birth year] Today's Date: 09/17/2018   History of Present Illness  Michael [name] is a 67 y.o. male with medical history significant of CAD S/P CABG, preserved EF as of July echo, HIV on HAART, ongoing cocaine abuse, HTN, DM2. Presents to ED for SOB    Clinical Impression  Mr. Able admitted with the above listed diagnosis. Patient ambulating at a slow pace - reports this at baseline. Mild unsteadiness throughout with use of handrail in hallway - reports he used to ambulate with SPC, but has lost this - may benefit from new [facility] for mobility. Performing all mobility with min guard for safety - no physical assist required for balance. Will follow to maximize safe and independent functional mobility prior to discharge.     Follow Up Recommendations No PT follow up    Equipment Recommendations  Cane    Recommendations for Other Services OT consult(as ordered)     Precautions / Restrictions Precautions Precautions: Fall Restrictions Weight Bearing Restrictions: No      Mobility  Bed Mobility Overal bed mobility: Modified Independent                Transfers Overall transfer level: Needs assistance Equipment used: 1 person hand held assist Transfers: Sit to/from Stand Sit to Stand: Min guard         General transfer comment: for safety and immediate standing balance  Ambulation/Gait Ambulation/Gait assistance: Min guard Gait Distance (Feet): 120 Feet Assistive device: None Gait Pattern/deviations: Step-through pattern;Decreased stride length Gait velocity: decreased - reports this at baseline   General Gait Details: intermittent use of hand rail in hallway  Stairs            Wheelchair Mobility    Modified Rankin (Stroke Patients Only)       Balance Overall balance assessment: Mild deficits observed, not formally tested                                            Pertinent Vitals/Pain Pain Assessment: No/denies pain Pain Score: 7  Pain Location: chest/ribs Pain Intervention(s): Limited activity within patient's tolerance;Monitored during session;Repositioned    Home Living Family/patient expects to be discharged to:: Shelter/Homeless Living Arrangements: Alone               Additional Comments: level entry; takes elevator to his area; regular toilets; shower stalls; uses cane intermittently - tends to leave it places.    Prior Function Level of Independence: Independent with assistive device(s)         Comments: intermittent use of SPC for mobility, shower stall has grab bars and seat     Hand Dominance   Dominant Hand: Right    Extremity/Trunk Assessment   Upper Extremity Assessment Upper Extremity Assessment: Defer to OT evaluation    Lower Extremity Assessment Lower Extremity Assessment: Generalized weakness    Cervical / Trunk Assessment Cervical / Trunk Assessment: Normal  Communication   Communication: No difficulties  Cognition Arousal/Alertness: Awake/alert Behavior During Therapy: WFL for tasks assessed/performed Overall Cognitive Status: Within Functional Limits for tasks assessed                                        General Comments  Exercises     Assessment/Plan    PT Assessment Patient needs continued PT services  PT Problem List Decreased strength;Decreased activity tolerance;Decreased balance;Decreased mobility;Decreased safety awareness       PT Treatment Interventions DME instruction;Gait training;Stair training;Functional mobility training;Therapeutic activities;Therapeutic exercise;Balance training;Patient/family education    PT Goals (Current goals can be found in the Care Plan section)  Acute Rehab PT Goals Patient Stated Goal: get better PT Goal Formulation: With patient Time For Goal Achievement: 09/24/18 Potential to Achieve  Goals: Good    Frequency Min 2X/week   Barriers to discharge        Co-evaluation               AM-PAC PT "6 Clicks" Mobility  Outcome Measure Help needed turning from your back to your side while in a flat bed without using bedrails?: None Help needed moving from lying on your back to sitting on the side of a flat bed without using bedrails?: A Little Help needed moving to and from a bed to a chair (including a wheelchair)?: A Little Help needed standing up from a chair using your arms (e.g., wheelchair or bedside chair)?: A Little Help needed to walk in hospital room?: A Little Help needed climbing 3-5 steps with a railing? : A Little 6 Click Score: 19    End of Session Equipment Utilized During Treatment: Gait belt Activity Tolerance: Patient tolerated treatment well Patient left: in bed;with call bell/phone within reach;with nursing/sitter in room Nurse Communication: Mobility status PT Visit Diagnosis: Unsteadiness on feet (R26.81);Muscle weakness (generalized) (M62.81)    Time: 0981-1914 PT Time Calculation (min) (ACUTE ONLY): 11 min   Charges:   PT Evaluation $PT Eval Moderate Complexity: 1 Mod         Lanney Gins, PT, DPT Supplemental Physical Therapist 09/17/18 2:50 PM Pager: [phone] Office: [phone]

## 2018-09-17 NOTE — Progress Notes (Signed)
During admission, pt reporting increased thoughts of SI with plan to jump in front of a train. Pt calm, cooperative and tearful stating that he has been in a bad place lately and needs help. Admitting MD Alcario Drought notified. Suicide precautions implemented and sitter ordered.

## 2018-09-17 NOTE — Progress Notes (Signed)
Sitter at bedside.

## 2018-09-17 NOTE — Care Management Obs Status (Signed)
[place] NOTIFICATION   Patient Details  Name: Abed Schar MRN: [id number] Date of Birth: 1951-05-24   Medicare Observation Status Notification Given:  Yes  Patient is on SI with permission given by patient for CM to sign.   Midge Minium RN, BSN, NCM-BC, ACM-RN [phone] 09/17/2018, 3:20 PM

## 2018-09-17 NOTE — Consult Note (Addendum)
[facility] Face-to-Face Psychiatry Consult   Reason for Consult:  SI Referring Physician:  Dr. Louanne Belton Patient Identification: Mario Coronado MRN:  [id number] Principal Diagnosis: MDD (major depressive disorder), recurrent severe, without psychosis ([place]) Diagnosis:  Principal Problem:   Acute bronchitis Active Problems:   HIV (human immunodeficiency virus infection) ([place])   Uncontrolled type 2 diabetes with neuropathy ([place])   Cocaine use disorder, severe, dependence ([place])   Essential hypertension   S/P CABG (coronary artery bypass graft)   Chronic kidney disease, stage III (moderate) ([place])   Total Time spent with patient: 1 hour  Subjective:   Spike Desilets is a 67 y.o. male patient admitted with acute bronchitis.  HPI:   Per chart review, patient was admitted with acute bronchitis. He reports increased thoughts of SI with a plan to jump in front of a train. He is tearful and reports that he is in a bad place and needs help. Of note, he was last hospitalized in 11/2017 at [facility] for depression and SI with plan to overdose on cocaine. He was discharged on Zoloft 50 mg daily and Trazodone 50 mg qhs PRN. He planned to follow up with [facility] and stay at the [facility].   On interview, Mr. Danis reports that he is stressed out.  He reports that he had multiple surgeries on his back after a work injury in 1985.  He reports that he became addicted to pain medications that were prescribed to him.  They were discontinued 8 years ago and he eventually started using crack cocaine.  He reports that he uses about $400-$500 worth of cocaine monthly.  He has been to rehab in the past and last completed rehab 5-6 years ago.  He denies alcohol or other illicit substance use.  He reports a history of depression and anxiety.  He denies taking psychotropic medications at this time.  He reports that he did not follow-up with outpatient services after he was discharged from [facility] in February.   He reports problems with forgetfulness.  He is oriented to person, place and time.  He endorses SI with a plan to run in front of a train.  He reports that he aborted a suicide attempt prior to hospitalization because bystanders stopped him from jumping in front of a train.  He denies a history of suicide attempts.  He endorses AH that make derogatory statements to him and tell him to harm himself.  He endorses VH of "little men."  He reports poor sleep and poor appetite with a 20 pound weight loss over the past 4 months.  Past Psychiatric History: MDD, cocaine abuse and GAD.   Risk to Self:  Yes endorses SI with a plan.  Risk to Others:  None. Denies HI.  Prior Inpatient Therapy:  He was last hospitalized at [facility] for [place] with a plan in 11/2017.  Prior Outpatient Therapy:  Prior medications include Cymbalta 60 mg daily and Risperdal 0.5 mg BID.   Past Medical History:  Past Medical History:  Diagnosis Date  . A-fib ([place])   . Asthma    No PFTs, history of childhood asthma  . CAD (coronary artery disease)    a. 06/2013 STEMI/PCI ([facility]): LAD w/ thrombus (treated with BMS), mid 75%, D2 75%; LCX OM2 75%; RCA small, PDA 95%, PLV 95%;  b. 10/2013 Cath/PCI: ISR w/in LAD (Promus DES x 2), borderline OM2 lesion;  c. 01/2014 MV: Intermediate risk, medium-sized distal ant wall infarct w/ very small amt of  peri-infarct ischemia. EF 60%.  . Cellulitis 04/2014   left facial  . Chondromalacia of medial femoral condyle    Left knee MRI 04/28/12: Chondromalacia of the medial femoral condyle with slight peripheral degeneration of the meniscocapsular junction of the medial meniscus; followed by sports medicine  . Collagen vascular disease ([place])   . Crack cocaine use    for 20+ years, has been enrolled in detox programs in the past  . Depression    with history of hospitalization for suicidal ideation  . Diabetes mellitus 2002   Diagnosed in 2002, started insulin in 2012  . Gout   . Gout 04/28/2012  . Headache(784.0)     CT head 08/2011: Periventricular and subcortical white matter hypodensities are most in keeping with chronic microangiopathic change  . HIV infection [facility]) Nov 2012   Followed by [name]  . Hyperlipidemia   . Hypertension   . Pulmonary embolism [facility])     Past Surgical History:  Procedure Laterality Date  . BACK SURGERY     1988  . BOWEL RESECTION    . CARDIAC SURGERY    . CERVICAL SPINE SURGERY     " rods in my neck "  . CORONARY ARTERY BYPASS GRAFT    . CORONARY STENT PLACEMENT    . NM MYOCAR PERF WALL MOTION  12/27/2011   normal  . SPINE SURGERY     Family History:  Family History  Problem Relation Age of Onset  . Diabetes Mother   . Hypertension Mother   . Hyperlipidemia Mother   . Diabetes Father   . Cancer Father   . Hypertension Father   . Diabetes Brother   . Heart disease Brother   . Diabetes Sister   . Colon cancer Neg Hx    Family Psychiatric  History: Denies  Social History:  Social History   Substance and Sexual Activity  Alcohol Use No  . Alcohol/week: 4.0 standard drinks  . Types: 2 Cans of beer, 2 Shots of liquor per week     Social History   Substance and Sexual Activity  Drug Use Not Currently  . Types: "Crack" cocaine, Cocaine   Comment: last use January 2018    Social History   Socioeconomic History  . Marital status: Widowed    Spouse name: Not on file  . Number of children: 2  . Years of education: 46  . Highest education level: 12th grade  Occupational History    Employer: UNEMPLOYED    Comment: 04/2016  Social Needs  . Financial resource strain: Somewhat hard  . Food insecurity:    Worry: Sometimes true    Inability: Sometimes true  . Transportation needs:    Medical: Yes    Non-medical: Yes  Tobacco Use  . Smoking status: Never Smoker  . Smokeless tobacco: Never Used  Substance and Sexual Activity  . Alcohol use: No    Alcohol/week: 4.0 standard drinks    Types: 2 Cans of beer, 2 Shots of liquor per week  .  Drug use: Not Currently    Types: "Crack" cocaine, Cocaine    Comment: last use January 2018  . Sexual activity: Yes    Comment: accepted condoms  Lifestyle  . Physical activity:    Days per week: 0 days    Minutes per session: Not on file  . Stress: Very much  Relationships  . Social connections:    Talks on phone: Once a week    Gets together: Never  Attends religious service: Never    Active member of club or organization: No    Attends meetings of clubs or organizations: Never    Relationship status: Widowed  Other Topics Concern  . Not on file  Social History Narrative   Currently staying with a friend in [name].  Was staying @ local motel until a few days ago - left b/c of bed bugs.   Additional Social History: He lives at the [facility]. He is a widower. His wife died 2 years ago. He has an adult son and daughter. He denies alcohol use. He reports cocaine abuse.     Allergies:  No Known Allergies  Labs:  Results for orders placed or performed during the hospital encounter of 09/16/18 (from the past 48 hour(s))  Comprehensive metabolic panel     Status: Abnormal   Collection Time: 09/16/18  8:29 PM  Result Value Ref Range   Sodium 136 135 - 145 mmol/L   Potassium 3.9 3.5 - 5.1 mmol/L   Chloride 103 98 - 111 mmol/L   CO2 22 22 - 32 mmol/L   Glucose, Bld 389 (H) 70 - 99 mg/dL   BUN 20 8 - 23 mg/dL   Creatinine, Ser 1.70 (H) 0.61 - 1.24 mg/dL   Calcium 8.8 (L) 8.9 - 10.3 mg/dL   Total Protein 6.9 6.5 - 8.1 g/dL   Albumin 3.3 (L) 3.5 - 5.0 g/dL   AST 18 15 - 41 U/L   ALT 13 0 - 44 U/L   Alkaline Phosphatase 77 38 - 126 U/L   Total Bilirubin 0.4 0.3 - 1.2 mg/dL   GFR calc non Af Amer 41 (L) >60 mL/min   GFR calc Af Amer 47 (L) >60 mL/min   Anion gap 11 5 - 15    Comment: Performed at [facility], [address] [place], [place] [address]  CBC WITH DIFFERENTIAL     Status: Abnormal   Collection Time: 09/16/18  8:29 PM  Result Value Ref Range   WBC 5.8  4.0 - 10.5 K/uL   RBC 4.10 (L) 4.22 - 5.81 MIL/uL   Hemoglobin 10.1 (L) 13.0 - 17.0 g/dL   HCT 34.2 (L) 39.0 - 52.0 %   MCV 83.4 80.0 - 100.0 fL   MCH 24.6 (L) 26.0 - 34.0 pg   MCHC 29.5 (L) 30.0 - 36.0 g/dL   RDW 13.7 11.5 - 15.5 %   Platelets 214 150 - 400 K/uL   nRBC 0.0 0.0 - 0.2 %   Neutrophils Relative % 58 %   Neutro Abs 3.4 1.7 - 7.7 K/uL   Lymphocytes Relative 29 %   Lymphs Abs 1.7 0.7 - 4.0 K/uL   Monocytes Relative 9 %   Monocytes Absolute 0.5 0.1 - 1.0 K/uL   Eosinophils Relative 2 %   Eosinophils Absolute 0.1 0.0 - 0.5 K/uL   Basophils Relative 1 %   Basophils Absolute 0.0 0.0 - 0.1 K/uL   Immature Granulocytes 1 %   Abs Immature Granulocytes 0.08 (H) 0.00 - 0.07 K/uL    Comment: Performed at [facility], [place] [address] [place], [place] [address]  I-stat troponin, ED  (not at [facility], [facility])     Status: None   Collection Time: 09/16/18  8:34 PM  Result Value Ref Range   Troponin i, poc 0.00 0.00 - 0.08 ng/mL   Comment 3            Comment: Due to the  release kinetics of cTnI, a negative result within the first hours of the onset of symptoms does not rule out myocardial infarction with certainty. If myocardial infarction is still suspected, repeat the test at appropriate intervals.   Urinalysis, Routine w reflex microscopic     Status: Abnormal   Collection Time: 09/16/18  8:35 PM  Result Value Ref Range   Color, Urine YELLOW YELLOW   APPearance CLEAR CLEAR   Specific Gravity, Urine 1.027 1.005 - 1.030   pH 5.0 5.0 - 8.0   Glucose, UA >=500 (A) NEGATIVE mg/dL   Hgb urine dipstick SMALL (A) NEGATIVE   Bilirubin Urine NEGATIVE NEGATIVE   Ketones, ur NEGATIVE NEGATIVE mg/dL   Protein, ur 100 (A) NEGATIVE mg/dL   Nitrite NEGATIVE NEGATIVE   Leukocytes, UA NEGATIVE NEGATIVE   RBC / HPF 0-5 0 - 5 RBC/hpf   WBC, UA 0-5 0 - 5 WBC/hpf   Bacteria, UA NONE SEEN NONE SEEN   Squamous Epithelial / LPF 0-5 0 - 5    Comment: Performed at [facility],  [address] [place], [place] [address]  Rapid urine drug screen (hospital performed)     Status: Abnormal   Collection Time: 09/16/18  8:35 PM  Result Value Ref Range   Opiates NONE DETECTED NONE DETECTED   Cocaine POSITIVE (A) NONE DETECTED   Benzodiazepines NONE DETECTED NONE DETECTED   Amphetamines NONE DETECTED NONE DETECTED   Tetrahydrocannabinol NONE DETECTED NONE DETECTED   Barbiturates NONE DETECTED NONE DETECTED    Comment: (NOTE) DRUG SCREEN FOR MEDICAL PURPOSES ONLY.  IF CONFIRMATION IS NEEDED FOR ANY PURPOSE, NOTIFY LAB WITHIN 5 DAYS. LOWEST DETECTABLE LIMITS FOR URINE DRUG SCREEN Drug Class                     Cutoff (ng/mL) Amphetamine and metabolites    1000 Barbiturate and metabolites    200 Benzodiazepine                 981 Tricyclics and metabolites     300 Opiates and metabolites        300 Cocaine and metabolites        300 THC                            50 Performed at [facility], [place] [address] [place], [place] [address]   D-dimer, quantitative (not at [facility])     Status: None   Collection Time: 09/16/18 10:52 PM  Result Value Ref Range   D-Dimer, Quant 0.33 0.00 - 0.50 ug/mL-FEU    Comment: REPEATED TO VERIFY (NOTE) At the manufacturer cut-off of 0.50 ug/mL FEU, this assay has been documented to exclude PE with a sensitivity and negative predictive value of 97 to 99%.  At this time, this assay has not been approved by the FDA to exclude DVT/VTE. Results should be correlated with clinical presentation. Performed at [facility], [place] [address], [place], [place] [address]   Glucose, capillary     Status: Abnormal   Collection Time: 09/17/18 12:17 AM  Result Value Ref Range   Glucose-Capillary 437 (H) 70 - 99 mg/dL  Glucose, capillary     Status: Abnormal   Collection Time: 09/17/18  6:59 AM  Result Value Ref Range   Glucose-Capillary 527 (HH) 70 - 99 mg/dL   Comment 1 Notify RN   Glucose, capillary  Status: Abnormal   Collection  Time: 09/17/18  7:04 AM  Result Value Ref Range   Glucose-Capillary 543 (HH) 70 - 99 mg/dL   Comment 1 Notify RN   Glucose, capillary     Status: Abnormal   Collection Time: 09/17/18  8:44 AM  Result Value Ref Range   Glucose-Capillary 497 (H) 70 - 99 mg/dL  Glucose, capillary     Status: Abnormal   Collection Time: 09/17/18 12:02 PM  Result Value Ref Range   Glucose-Capillary 302 (H) 70 - 99 mg/dL    Current Facility-Administered Medications  Medication Dose Route Frequency Provider Last Rate Last Dose  . albuterol (PROVENTIL) (2.5 MG/3ML) 0.083% nebulizer solution 2.5 mg  2.5 mg Nebulization Q2H PRN Etta Quill, DO      . allopurinol (ZYLOPRIM) tablet 300 mg  300 mg Oral Daily Jennette Kettle M, DO   300 mg at 09/17/18 0930  . aspirin chewable tablet 81 mg  81 mg Oral Daily Etta Quill, DO   81 mg at 09/17/18 9937  . emtricitabine-rilpivir-tenofovir AF (ODEFSEY) 200-25-25 MG per tablet 1 tablet  1 tablet Oral Q breakfast Etta Quill, DO   1 tablet at 09/17/18 0802  . enoxaparin (LOVENOX) injection 40 mg  40 mg Subcutaneous Daily Alcario Drought, Jared M, DO   40 mg at 09/17/18 0930  . gabapentin (NEURONTIN) capsule 400 mg  400 mg Oral BID PRN Etta Quill, DO      . hydrOXYzine (ATARAX/VISTARIL) tablet 25 mg  25 mg Oral TID PRN Etta Quill, DO      . insulin aspart (novoLOG) injection 0-15 Units  0-15 Units Subcutaneous TID WC Etta Quill, DO   11 Units at 09/17/18 1215  . insulin glargine (LANTUS) injection 20 Units  20 Units Subcutaneous QHS Etta Quill, DO   20 Units at 09/17/18 0017  . mometasone-formoterol (DULERA) 200-5 MCG/ACT inhaler 1 puff  1 puff Inhalation BID Etta Quill, DO   1 puff at 09/17/18 0802  . predniSONE (DELTASONE) tablet 40 mg  40 mg Oral Q breakfast Etta Quill, DO   40 mg at 09/17/18 0802  . risperiDONE (RISPERDAL) tablet 0.5 mg  0.5 mg Oral Daily Alcario Drought, Jared M, DO   0.5 mg at 09/17/18 0930    Musculoskeletal: Strength &  Muscle Tone: within normal limits Gait & Station: UTA since patient is lying in bed. Patient leans: N/A  Psychiatric Specialty Exam: Physical Exam  Nursing note and vitals reviewed. Constitutional: He is oriented to person, place, and time. He appears well-developed and well-nourished.  HENT:  Head: Normocephalic and atraumatic.  Neck: Normal range of motion.  Respiratory: Effort normal.  Musculoskeletal: Normal range of motion.  Neurological: He is alert and oriented to person, place, and time.  Psychiatric: His speech is normal and behavior is normal. Judgment normal. Cognition and memory are normal. He exhibits a depressed mood. He expresses suicidal ideation. He expresses suicidal plans.    Review of Systems  Cardiovascular: Positive for chest pain.  Gastrointestinal: Negative for abdominal pain, constipation, diarrhea, nausea and vomiting.  Psychiatric/Behavioral: Positive for depression, hallucinations (AVH), substance abuse and suicidal ideas. The patient has insomnia.   All other systems reviewed and are negative.   Blood pressure (!) 158/100, pulse 85, temperature 97.7 F (36.5 C), temperature source Oral, resp. rate 18, height 5\' 8"  (1.727 m), weight 74.4 kg, SpO2 100 %.Body mass index is 24.94 kg/m.  General Appearance: Fairly Groomed, elderly, African  American male, wearing a hospital gown with unshaved face and male pattern baldness who is lying in bed. NAD.   Eye Contact:  Good  Speech:  Clear and Coherent and Normal Rate  Volume:  Decreased  Mood:  Depressed  Affect:  Constricted  Thought Process:  Goal Directed, Linear and Descriptions of Associations: Intact  Orientation:  Full (Time, Place, and Person)  Thought Content:  Logical  Suicidal Thoughts:  Yes.  with intent/plan  Homicidal Thoughts:  No  Memory:  Immediate;   Good Recent;   Good Remote;   Good  Judgement:  Fair  Insight:  Fair  Psychomotor Activity:  Normal  Concentration:  Concentration: Good and  Attention Span: Good  Recall:  Good  Fund of Knowledge:  Good  Language:  Good  Akathisia:  No  Handed:  Right  AIMS (if indicated):   N/A  Assets:  Communication Skills Desire for Improvement Financial Resources/Insurance Housing Resilience  ADL's:  Intact  Cognition:  WNL  Sleep:   N/A   Assessment:  Arham Symmonds is a 67 y.o. male who was admitted with acute bronchitis. He endorses depressed mood and SI with a plan to harm self in the setting of multiple psychosocial stressors. He endorses AVH although they do not appear to be true psychosis and likely egodystonic thoughts. He does not appear to be responding to internal stimuli. Recommend restarting Zoloft and Trazodone for mood and insomnia respectively. He warrants inpatient psychiatric hospitalization at this time.   Treatment Plan Summary: -Patient warrants inpatient psychiatric hospitalization given high risk of harm to self. -Continue bedside sitter.  -Start Zoloft 50 mg daily for mood and Trazodone 50 mg qhs PRN for insomnia.  -Continue Risperdal 0.5 mg daily for mood stabilization/possible psychosis.  -EKG reviewed and QTc 413 on 12/11. Please closely monitor when starting or increasing QTc prolonging agents.  -Please have SW provide patient with substance abuse treatment resources.  -Please pursue involuntary commitment if patient refuses voluntary psychiatric hospitalization or attempts to leave the hospital.  -Will sign off on patient at this time. Please consult psychiatry again as needed.    Disposition: Recommend psychiatric Inpatient admission when medically cleared.  Faythe Dingwall, DO 09/17/2018 12:22 PM

## 2018-09-17 NOTE — Evaluation (Signed)
Occupational Therapy Evaluation Patient Details Name: Michael [name] MRN: [id number] DOB: Dec 03, 1950 Today's Date: 09/17/2018    History of Present Illness Delorean Knutzen is a 67 y.o. male with medical history significant of CAD S/P CABG, preserved EF as of July echo, HIV on HAART, ongoing cocaine abuse, HTN, DM2. Presents to ED for SOB   Clinical Impression   Pt is at min guard A level with ADL transfers and min guard A - sup with LB ADLs. Pt reports R rib and chest area pain and demos slow, guarded pace of movement. Pt reports that he uses a cane for mobility. Pt would benefit from acute OT services to address LB ADL and ADL mobility safety.     Follow Up Recommendations  No OT follow up    Equipment Recommendations  Other (comment)(ADL A/E for LB)    Recommendations for Other Services       Precautions / Restrictions Precautions Precautions: Fall Restrictions Weight Bearing Restrictions: No      Mobility Bed Mobility Overal bed mobility: Modified Independent                Transfers Overall transfer level: Needs assistance Equipment used: 1 person hand held assist Transfers: Sit to/from Stand Sit to Stand: Min guard              Balance Overall balance assessment: Mild deficits observed, not formally tested                                         ADL either performed or assessed with clinical judgement   ADL Overall ADL's : Needs assistance/impaired Eating/Feeding: Independent   Grooming: Wash/dry hands;Wash/dry face;Standing;Supervision/safety   Upper Body Bathing: Modified independent   Lower Body Bathing: Min guard;Supervison/ safety   Upper Body Dressing : Supervision/safety   Lower Body Dressing: Min guard;Supervision/safety   Toilet Transfer: Min guard;Ambulation;Grab bars   Toileting- Clothing Manipulation and Hygiene: Supervision/safety   Tub/ Banker: Min guard;Ambulation;Grab bars   Functional  mobility during ADLs: Min guard General ADL Comments: PLOF has difficulty with socks and shoes, but no physical assist required. Min guard A for toilet and shower transfers     Vision Baseline Vision/History: Wears glasses Wears Glasses: Reading only Patient Visual Report: No change from baseline       Perception     Praxis      Pertinent Vitals/Pain Pain Assessment: 0-10 Pain Score: 7  Pain Location: chest/ribs Pain Intervention(s): Monitored during session;Repositioned     Hand Dominance Right   Extremity/Trunk Assessment Upper Extremity Assessment Upper Extremity Assessment: Overall WFL for tasks assessed   Lower Extremity Assessment Lower Extremity Assessment: Defer to PT evaluation   Cervical / Trunk Assessment Cervical / Trunk Assessment: Normal   Communication Communication Communication: No difficulties   Cognition Arousal/Alertness: Awake/alert Behavior During Therapy: WFL for tasks assessed/performed Overall Cognitive Status: Within Functional Limits for tasks assessed                                     General Comments       Exercises     Shoulder Instructions      Home Living Family/patient expects to be discharged to:: Shelter/Homeless Living Arrangements: Alone  Additional Comments: level entry; takes elevator to his area; regular toilets; shower stalls; uses cane intermittently - tends to leave it places.      Prior Functioning/Environment Level of Independence: Independent with assistive device(s)        Comments: intermittent use of SPC for mobility, shower stall has grab bars and seat        OT Problem List: Decreased activity tolerance;Pain;Impaired balance (sitting and/or standing);Decreased knowledge of use of DME or AE      OT Treatment/Interventions: Self-care/ADL training;Therapeutic activities;Patient/family education;DME and/or AE instruction    OT Goals(Current  goals can be found in the care plan section) Acute Rehab OT Goals Patient Stated Goal: get better OT Goal Formulation: With patient Time For Goal Achievement: 09/24/18 Potential to Achieve Goals: Good ADL Goals Additional ADL Goal #1: Pt will complete ADL transfers to toilet and shower with sup - Mod I Additional ADL Goal #2: Pt will complete LB ADLs with A/E trg with Mod I  OT Frequency: Min 2X/week   Barriers to D/C:    no barriers       Co-evaluation              AM-PAC OT "6 Clicks" Daily Activity     Outcome Measure Help from another person eating meals?: None Help from another person taking care of personal grooming?: None Help from another person toileting, which includes using toliet, bedpan, or urinal?: None Help from another person bathing (including washing, rinsing, drying)?: None Help from another person to put on and taking off regular upper body clothing?: None Help from another person to put on and taking off regular lower body clothing?: A Little 6 Click Score: 23   End of Session Equipment Utilized During Treatment: Gait belt  Activity Tolerance: Patient tolerated treatment well Patient left: in bed;with call bell/phone within reach;with nursing/sitter in room  OT Visit Diagnosis: Unsteadiness on feet (R26.81);Other abnormalities of gait and mobility (R26.89);Pain Pain - Right/Left: Right Pain - part of body: (ribs and chest)                Time: 1351-1402 OT Time Calculation (min): 11 min Charges:  OT General Charges $OT Visit: 1 Visit OT Evaluation $OT Eval Moderate Complexity: 1 Mod    Britt Bottom 09/17/2018, 2:31 PM

## 2018-09-17 NOTE — Progress Notes (Addendum)
Same day note  Patient seen and examined at bedside.  Patient was admitted to the hospital for shortness of breath.  Past medical history of coronary artery disease , history of CABG, HIV on HAART presenting with shortness of breath for 1 day.  He also had increased work of breathing.  At the time of my evaluation, patient complains of cough, shortness of breath and wheezing.  He does not feel well.  Physical examination reveals accelerated hypertension.  Creatinine elevated at 1.7.  Diffuse wheezing in his lungs.  Patient appears to be anxious and mildly restless.  On nasal cannula supplemental oxygen.  Laboratory data and imaging was reviewed.  CT angiogram of the chest was negative for PE or pneumonia.  Assessment and Plan.  Continue the treatment for acute asthma exacerbation.  Continue with oxygen nebulizer steroids.  Patient currently lives at shelter and is homeless.  He might need medication assistance.  Hyperglycemia on presentation.  Will resume insulin regimen.  He is on steroids likely to cause hyperglycemia.  Increase the dose of Lantus tonight.  Added mealtime insulin.  Disposition likely home in 1 to 2 days.   No Charge  Signed,  Delila Pereyra, MD [facility] Pager: [phone]  Addendum at 4:19 PM   spoke with psychiatry Dr Mariea Clonts. Add zoloft and trazodone. Will consider inpatient psychiatry admission once medically stable.

## 2018-09-17 NOTE — Progress Notes (Signed)
Inpatient Diabetes Program Recommendations  AACE/ADA: New Consensus Statement on Inpatient Glycemic Control (2015)  Target Ranges:  Prepandial:   less than 140 mg/dL      Peak postprandial:   less than 180 mg/dL (1-2 hours)      Critically ill patients:  140 - 180 mg/dL   Lab Results  Component Value Date   GLUCAP 302 (H) 09/17/2018   HGBA1C 9.7 (H) 05/04/2018    Review of Glycemic Control Results for WISE, FEES (MRN [id number]) as of 09/17/2018 13:21  Ref. Range 09/17/2018 07:04 09/17/2018 08:44 09/17/2018 12:02  Glucose-Capillary Latest Ref Range: 70 - 99 mg/dL 543 (HH) 497 (H) 302 (H)   Diabetes history: Type 2 DM Outpatient Diabetes medications: Lantus 30 units QHS, Humalog 5 units TID, Metformin 500 mg BID Current orders for Inpatient glycemic control: Lantus 20 units QHS, Novolog 0-15 units TID Prednisone 40 mg QAM  Inpatient Diabetes Program Recommendations:    If to remain inpatient:  Consider increasing Lantus to 25 units QHS and adding Novolog 5 units TID of meal coverage (assuming patient is consuming >50% of meals).  Thanks, Bronson Curb, MSN, RNC-OB Diabetes Coordinator [phone] (8a-5p)

## 2018-09-18 DIAGNOSIS — I10 Essential (primary) hypertension: Secondary | ICD-10-CM | POA: Diagnosis not present

## 2018-09-18 DIAGNOSIS — J209 Acute bronchitis, unspecified: Secondary | ICD-10-CM | POA: Diagnosis not present

## 2018-09-18 DIAGNOSIS — F332 Major depressive disorder, recurrent severe without psychotic features: Secondary | ICD-10-CM

## 2018-09-18 DIAGNOSIS — F1420 Cocaine dependence, uncomplicated: Secondary | ICD-10-CM | POA: Diagnosis not present

## 2018-09-18 DIAGNOSIS — J4521 Mild intermittent asthma with (acute) exacerbation: Secondary | ICD-10-CM | POA: Diagnosis not present

## 2018-09-18 LAB — GLUCOSE, CAPILLARY
Glucose-Capillary: 179 mg/dL — ABNORMAL HIGH (ref 70–99)
Glucose-Capillary: 130 mg/dL — ABNORMAL HIGH (ref 70–99)
Glucose-Capillary: 275 mg/dL — ABNORMAL HIGH (ref 70–99)
Glucose-Capillary: 292 mg/dL — ABNORMAL HIGH (ref 70–99)

## 2018-09-18 LAB — BASIC METABOLIC PANEL
Anion gap: 9 (ref 5–15)
BUN: 31 mg/dL — ABNORMAL HIGH (ref 8–23)
CALCIUM: 8.6 mg/dL — AB (ref 8.9–10.3)
CO2: 22 mmol/L (ref 22–32)
Chloride: 107 mmol/L (ref 98–111)
Creatinine, Ser: 1.52 mg/dL — ABNORMAL HIGH (ref 0.61–1.24)
GFR calc Af Amer: 54 mL/min — ABNORMAL LOW (ref >60)
GFR calc non Af Amer: 46 mL/min — ABNORMAL LOW (ref >60)
Glucose, Bld: 221 mg/dL — ABNORMAL HIGH (ref 70–99)
Potassium: 4 mmol/L (ref 3.5–5.1)
Sodium: 138 mmol/L (ref 135–145)

## 2018-09-18 LAB — CBC
HCT: 28.3 % — ABNORMAL LOW (ref 39.0–52.0)
Hemoglobin: 8.9 g/dL — ABNORMAL LOW (ref 13.0–17.0)
MCH: 24.9 pg — ABNORMAL LOW (ref 26.0–34.0)
MCHC: 31.4 g/dL (ref 30.0–36.0)
MCV: 79.1 fL — ABNORMAL LOW (ref 80.0–100.0)
Platelets: 178 10*3/uL (ref 150–400)
RBC: 3.58 MIL/uL — ABNORMAL LOW (ref 4.22–5.81)
RDW: 14 % (ref 11.5–15.5)
WBC: 6.7 10*3/uL (ref 4.0–10.5)
nRBC: 0 % (ref 0.0–0.2)

## 2018-09-18 LAB — HIV-1 RNA QUANT-NO REFLEX-BLD
HIV 1 RNA Quant: 360 copies/mL
LOG10 HIV-1 RNA: 2.556 {Log_copies}/mL

## 2018-09-18 NOTE — Progress Notes (Addendum)
PROGRESS NOTE  Michael [name] GHW:[id number] DOB: 01/07/1951 DOA: 09/16/2018 PCP: [name], PA-C   LOS: 0 days   Brief narrative:  Patient is a 67 years old male with past medical history of coronary artery disease status post CABG, HIV on [place] medications, ongoing cocaine abuse, hypertension, diabetes mellitus type 2 presented to the hospital with shortness of breath, cough, wheezing and some chest discomfort.  Patient did have some albuterol at home which have provided some relief but continued to have symptoms so he presented to the hospital.  She was admitted to hospital for acute bronchitis asthma exacerbation and suicidal ideation on the background of substance abuse and bipolar disorder.  Patient was placed on one-to-one sitter.  Patient was seen by psychiatry and recommended inpatient psychiatric facility transfer after medical stabilization.   Assessment/Plan:  Principal Problem:   MDD (major depressive disorder), recurrent severe, without psychosis ([place]) Active Problems:   HIV (human immunodeficiency virus infection) ([place])   Uncontrolled type 2 diabetes with neuropathy ([place])   Cocaine use disorder, severe, dependence ([place])   Essential hypertension   S/P CABG (coronary artery bypass graft)   Chronic kidney disease, stage III (moderate) ([name])   Acute bronchitis  Acute bronchitis/asthma exacerbation.  Patient denies any cigarette smoking.  Patient was continued on oxygen, nebulizer steroids with improvement in his overall breathing status though he is still symptomatic today.  Patient will be continued steroids and nebulizers.  History of manic depressive disorder recurrent, cocaine abuse, generalized anxiety disorder.  Now with suicidal ideation.  Patient was seen by psychiatry Dr. Mariea Clonts.  Patient was initiated on trazodone and Zoloft as per psychiatry recommendation.  Patient will be continued on risperidone.  Psychiatry recommended inpatient psychiatric  hospitalization once the patient was stable.  Spoke with social worker about it- regarding inpatient psychiatric admission after medical stabilization.  Continue suicide precautions.  Diabetes mellitus type 2, uncontrolled.  Continue on Accu-Chek sliding scale, Lantus insulin, diabetic diet.  Will closely monitor.  Closely monitor on prednisone.  History of hypertension, coronary artery disease CABG.  No acute issues.  We will continue current medications.  Peripheral neuropathy.  Continue Neurontin.  HIV.  Continue on O[place]sey  VTE Prophylaxis: Lovenox  Code Status:  Full code  Family Communication: No one present at bedside.  Disposition Plan: To inpatient psychiatric facility by tomorrow if bed is available.  Patient is improving medically and is stable for discharge to a psychiatry facility when bed is available.  patient will need inhalers for asthma on discharge.  I have informed the social worker regarding disposition./  Consultants:  Psychiatry  Procedures:  None  Antibiotics:  None  Subjective: Patient complains of some pain over the legs.  Patient states that his breathing is slightly improved. Has mild cough and wheezing.  Objective: Vitals:   09/18/18 0517 09/18/18 1117  BP: 126/90 139/89  Pulse: 76 89  Resp: 18 18  Temp: 98.1 F (36.7 C) 98.2 F (36.8 C)  SpO2: 99% 99%    Intake/Output Summary (Last 24 hours) at 09/18/2018 1520 Last data filed at 09/18/2018 0815 Gross per 24 hour  Intake 698 ml  Output -  Net 698 ml   Filed Weights   09/16/18 2021 09/16/18 2310  Weight: 74.4 kg 74.4 kg   Physical Examination: General exam: Appears calm and comfortable ,Not in distress HEENT:PERRL,Oral mucosa moist Respiratory system: Bilateral equal air entry, normal vesicular breath sounds, minimal wheezes noted Cardiovascular system: S1 & S2 heard, RRR.  Gastrointestinal  system: Abdomen is nondistended, soft and nontender. No organomegaly or masses felt.  Normal bowel sounds heard. Central nervous system: Alert and oriented. No focal neurological deficits. Extremities: No edema, no clubbing ,no cyanosis, distal peripheral pulses palpable. Skin: No rashes, lesions or ulcers,no icterus ,no pallor MSK: Normal muscle bulk,tone ,power  Data Review: I have personally reviewed the following laboratory data and studies,  CBC: Recent Labs  Lab 09/16/18 2029 09/18/18 0531  WBC 5.8 6.7  NEUTROABS 3.4  --   HGB 10.1* 8.9*  HCT 34.2* 28.3*  MCV 83.4 79.1*  PLT 214 505   Basic Metabolic Panel: Recent Labs  Lab 09/16/18 2029 09/18/18 0531  NA 136 138  K 3.9 4.0  CL 103 107  CO2 22 22  GLUCOSE 389* 221*  BUN 20 31*  CREATININE 1.70* 1.52*  CALCIUM 8.8* 8.6*   Liver Function Tests: Recent Labs  Lab 09/16/18 2029  AST 18  ALT 13  ALKPHOS 77  BILITOT 0.4  PROT 6.9  ALBUMIN 3.3*   No results for input(s): LIPASE, AMYLASE in the last 168 hours. No results for input(s): AMMONIA in the last 168 hours. Cardiac Enzymes: No results for input(s): CKTOTAL, CKMB, CKMBINDEX, TROPONINI in the last 168 hours. BNP (last 3 results) Recent Labs    02/20/18 1318 05/04/18 0237  BNP 83.0 36.1    ProBNP (last 3 results) No results for input(s): PROBNP in the last 8760 hours.  CBG: Recent Labs  Lab 09/17/18 1202 09/17/18 1732 09/17/18 2104 09/18/18 0647 09/18/18 1115  GLUCAP 302* 252* 254* 179* 130*   No results found for this or any previous visit (from the past 240 hour(s)).   Studies: Dg Chest 2 View  Result Date: 09/16/2018 CLINICAL DATA:  Mildly productive cough. Shortness of breath. EXAM: CHEST - 2 VIEW COMPARISON:  06/08/2018 and chest CT dated 09/02/2018. FINDINGS: Normal sized heart. Tortuous aorta. Stable left atrial appendage clip and coronary artery stent. Post CABG changes are again demonstrated. Stable small amount of linear scarring in the lingula or right middle lobe and small amount of linear scarring in the right  perihilar region. Cervical spine fixation hardware. IMPRESSION: No acute abnormality. Electronically Signed   By: Claudie Revering M.D.   On: 09/16/2018 21:21    Scheduled Meds: . allopurinol  300 mg Oral Daily  . aspirin  81 mg Oral Daily  . emtricitabine-rilpivir-tenofovir AF  1 tablet Oral Q breakfast  . enoxaparin (LOVENOX) injection  40 mg Subcutaneous Daily  . insulin aspart  0-15 Units Subcutaneous TID WC  . insulin aspart  7 Units Subcutaneous TID WC  . insulin glargine  25 Units Subcutaneous QHS  . mometasone-formoterol  1 puff Inhalation BID  . predniSONE  40 mg Oral Q breakfast  . risperiDONE  0.5 mg Oral Daily  . sertraline  50 mg Oral Daily  . traZODone  50 mg Oral QHS   Continuous Infusions:  Donshay Lupinski  [facility] Pager [phone]  If 7PM-7AM, please contact night-coverage at [email or web address], password [facility] 09/18/2018, 3:20 PM

## 2018-09-18 NOTE — Consult Note (Signed)
   [facility] Inpatient Consult   09/18/2018  Jermain Curt December 21, 1950 [id number]   [facility] Care Management follow up.  Chart reviewed. Psychiatric consult notes reveal recommendation for inpatient psych admission.   Will hold off on make [place] Management referral at this time. Will make [place] Management referral when appropriate.  Will continue to follow along for disposition plans and progression.   Marthenia Rolling, MSN-Ed, RN,BSN [facility] Liaison [phone]

## 2018-09-18 NOTE — Progress Notes (Signed)
CSN: [id number] Arrival date & time: 09/16/18  2009   Michael [name] is a 67 y.o. male.   History              Chief Complaint    Chief Complaint  Patient presents with  . Shortness of Breath   Social History Social History        Tobacco Use  . Smoking status: Never Smoker  . Smokeless tobacco: Never Used  Substance Use Topics  . Alcohol use: No    Alcohol/week: 4.0 standard drinks    Types: 2 Cans of beer, 2 Shots of liquor per week  . Drug use: Not Currently    Types: "Crack" cocaine, Cocaine    Comment: last use January 2018     Past Medical History:  Diagnosis Date  . A-fib ([place])   . Asthma    No PFTs, history of childhood asthma  . CAD (coronary artery disease)    a. 06/2013 STEMI/PCI ([facility]): LAD w/ thrombus (treated with BMS), mid 75%, D2 75%; LCX OM2 75%; RCA small, PDA 95%, PLV 95%;  b. 10/2013 Cath/PCI: ISR w/in LAD (Promus DES x 2), borderline OM2 lesion;  c. 01/2014 MV: Intermediate risk, medium-sized distal ant wall infarct w/ very small amt of peri-infarct ischemia. EF 60%.  . Cellulitis 04/2014   left facial  . Chondromalacia of medial femoral condyle    Left knee MRI 04/28/12: Chondromalacia of the medial femoral condyle with slight peripheral degeneration of the meniscocapsular junction of the medial meniscus; followed by sports medicine  . Collagen vascular disease ([place])   . Crack cocaine use    for 20+ years, has been enrolled in detox programs in the past  . Depression    with history of hospitalization for suicidal ideation  . Diabetes mellitus 2002   Diagnosed in 2002, started insulin in 2012  . Gout   . Gout 04/28/2012  . Headache(784.0)    CT head 08/2011: Periventricular and subcortical white matter hypodensities are most in keeping with chronic microangiopathic change  . HIV infection [facility]) Nov 2012   Followed by [name]  . Hyperlipidemia   . Hypertension   . Pulmonary embolism [facility])          Patient Active Problem List   Diagnosis Date Noted  . Type II diabetes mellitus with renal manifestations ([place]) 05/04/2018  . GERD (gastroesophageal reflux disease) 05/04/2018  . S/P CABG (coronary artery bypass graft)   . Left testicular pain   . Depression 02/20/2018  . Epididymo-orchitis, acute 02/20/2018  . Essential hypertension 02/20/2018  . Precordial chest pain 02/20/2018  . AKI (acute kidney injury) ([place]) 02/14/2018  . HCAP (healthcare-associated pneumonia) 02/14/2018  . History of pulmonary embolism 07/04/2017  . Acute hyponatremia 05/11/2017  . Hyperglycemia due to type 2 diabetes mellitus ([place]) 05/11/2017  . CHF (congestive heart failure) ([place]) 04/01/2017  . Hepatitis C 12/30/2016  . Chronic ischemic heart disease 11/12/2016  . Paroxysmal A-fib ([place]) 11/12/2016  . Back pain 04/18/2016  . S/P carotid endarterectomy 11/15/2015  . MDD (major depressive disorder), recurrent severe, without psychosis ([place]) 09/09/2015  . Cocaine-induced mood disorder ([place]) 08/14/2015  . Cocaine abuse with cocaine-induced mood disorder ([place]) 08/14/2015  . Gout 07/10/2015  . Acute renal failure superimposed on stage 3 chronic kidney disease ([place]) 03/06/2015  . Normocytic anemia 03/06/2015  . Chronic kidney disease, stage III (moderate) ([place]) 03/06/2015  . Hypoglycemia   . Encounter for general adult medical examination with abnormal findings 02/09/2015  .  Cocaine use disorder, severe, dependence ([place]) 12/13/2014  . Substance or medication-induced depressive disorder with onset during withdrawal ([place]) 12/13/2014  . Severe recurrent major depressive disorder with psychotic features ([place]) 12/12/2014  . Cervicalgia 06/28/2014  . Lumbar radiculopathy, chronic 06/28/2014  . Asthma, chronic 02/03/2014  . 3-vessel CAD 06/24/2013  . ED (erectile dysfunction) of organic origin 07/07/2012  . Hypertension goal BP (blood pressure) < 140/80 04/29/2012  . Chondromalacia of left knee 03/19/2012  .  Hyperlipidemia with target LDL less than 100 02/12/2012  . Fibromyalgia 02/12/2012  . HIV (human immunodeficiency virus infection) ([place]) 08/27/2011  . Uncontrolled type 2 diabetes with neuropathy ([place]) 10/17/2000         Past Surgical History:  Procedure Laterality Date  . BACK SURGERY     1988  . BOWEL RESECTION    . CARDIAC SURGERY    . CERVICAL SPINE SURGERY     " rods in my neck "  . CORONARY ARTERY BYPASS GRAFT    . CORONARY STENT PLACEMENT    . NM MYOCAR PERF WALL MOTION  12/27/2011   normal  . SPINE SURGERY      Pt alert and oriented x4. Ambulatory with steady gait. Pt verbalized understanding plan of care. Pt verbalized "breathing treatments are helping with SOB." Pt afebrile. Ambulated in room and to bathroom without dyspnea on exertion.    Pt educated about inpatient psychiatric recommendation based on suicidal attempts and suicidal thoughts. Pt aware that would not be discharged back to shelter. Pt denies visual or auditory hallucinations at this time.  Cooperative with staff who attended to patient care. Pt has SI sitter at bedside. UDS positive for cocaine. Pt being monitored closely for signs of withdrawals. None witness during shift. Denies n/v, no tremors, no sweats, agitation, or anxiousness. Pt able to follow a series of commands.

## 2018-09-18 NOTE — Progress Notes (Signed)
Occupational Therapy Treatment Patient Details Name: Lysander Calixte MRN: [id number] DOB: [birth year] Today's Date: 09/18/2018    History of present illness Noal Abshier is a 67 y.o. male with medical history significant of CAD S/P CABG, preserved EF as of July echo, HIV on HAART, ongoing cocaine abuse, HTN, DM2. Presents to ED for SOB   OT comments  Pt making good progress with functional goals. Pt provided with ADL A/E for LB selfcare and is able to return demo with sup/min guard A  Follow Up Recommendations  No OT follow up    Equipment Recommendations  3 in 1 bedside commode;Other (comment)(reacher, LH sponge, LH shoe horn, sock aid)    Recommendations for Other Services      Precautions / Restrictions Precautions Precautions: Fall Restrictions Weight Bearing Restrictions: No       Mobility Bed Mobility Overal bed mobility: Modified Independent                Transfers Overall transfer level: Needs assistance Equipment used: 1 person hand held assist Transfers: Sit to/from Stand Sit to Stand: Min guard;Supervision              Balance Overall balance assessment: Mild deficits observed, not formally tested                                         ADL either performed or assessed with clinical judgement   ADL Overall ADL's : Needs assistance/impaired             Lower Body Bathing: Supervison/ safety;Set up;Sitting/lateral leans;Sit to/from stand;With adaptive equipment       Lower Body Dressing: Supervision/safety;Set up;With adaptive equipment;Sitting/lateral leans;Sit to/from stand   Toilet Transfer: Min guard;Ambulation;Grab bars;Supervision/safety   Toileting- Clothing Manipulation and Hygiene: Supervision/safety;Sit to/from stand   Tub/ Banker: Ambulation;Grab bars;Supervision/safety;Min guard   Functional mobility during ADLs: Min guard;Supervision/safety General ADL Comments: pt provided with ADL  A/E and education, pt able to return demonstrate     Vision       Perception     Praxis      Cognition Arousal/Alertness: Awake/alert Behavior During Therapy: WFL for tasks assessed/performed Overall Cognitive Status: Within Functional Limits for tasks assessed                                          Exercises     Shoulder Instructions       General Comments      Pertinent Vitals/ Pain       Pain Assessment: 0-10 Pain Score: 6  Pain Location: R chest/ribs Pain Descriptors / Indicators: Sore Pain Intervention(s): Monitored during session;Repositioned  Home Living                                          Prior Functioning/Environment              Frequency  Min 2X/week        Progress Toward Goals  OT Goals(current goals can now be found in the care plan section)  Progress towards OT goals: Progressing toward goals     Plan Discharge plan remains appropriate    Co-evaluation  AM-PAC OT "6 Clicks" Daily Activity     Outcome Measure   Help from another person eating meals?: None Help from another person taking care of personal grooming?: None Help from another person toileting, which includes using toliet, bedpan, or urinal?: None Help from another person bathing (including washing, rinsing, drying)?: None Help from another person to put on and taking off regular upper body clothing?: None Help from another person to put on and taking off regular lower body clothing?: A Little 6 Click Score: 23    End of Session Equipment Utilized During Treatment: Gait belt;Other (comment)(3 in 1 in shower)  OT Visit Diagnosis: Unsteadiness on feet (R26.81);Other abnormalities of gait and mobility (R26.89);Pain Pain - Right/Left: Right   Activity Tolerance Patient tolerated treatment well   Patient Left in bed;with call bell/phone within reach;with nursing/sitter in room   Nurse Communication           Time: 9292-4462 OT Time Calculation (min): 29 min  Charges: OT General Charges $OT Visit: 1 Visit OT Evaluation $OT Eval Low Complexity: 1 Low OT Treatments $Self Care/Home Management : 8-22 mins $Therapeutic Activity: 8-22 mins     Britt Bottom 09/18/2018, 12:27 PM

## 2018-09-18 NOTE — Clinical Social Work Note (Addendum)
CSW received consult from MD regarding psych placement for patient. CSW talked with patient and he is willing to go any psych facility voluntarily. Referrals sent to: Str 1. [facility] [facility]: [phone] 2. [facility] [phone] 3. [facility] [phone] (Intake) - No beds 4. [facility] [phone] 9. [facility] [phone] 6. [facility] - [phone] 7. [facility] [phone] 8. [facility] [phone] or [phone] - Per Caryl Pina they have beds.  CSW received a call from [place] with [facility] [phone]) and she had questions regarding placement after treatment and patient substance abuse issues. These questions were answered - patient plans to return to [facility] and seek outpatient substance abuse treatment.  Levada Dy also requested to speak with patient's nurse. MD contacted and updated regarding psych placement search. Patient signed voluntary admission for [facility] expressing his willingness to discharge voluntarily to any psych facility. Weekend CSW will be updated and will follow through the weekend.   Kacy Hegna Givens, MSW, LCSW Licensed Clinical Social Worker [place] [phone]

## 2018-09-19 DIAGNOSIS — F332 Major depressive disorder, recurrent severe without psychotic features: Secondary | ICD-10-CM | POA: Diagnosis not present

## 2018-09-19 LAB — BASIC METABOLIC PANEL
Anion gap: 8 (ref 5–15)
BUN: 31 mg/dL — ABNORMAL HIGH (ref 8–23)
CO2: 26 mmol/L (ref 22–32)
CREATININE: 1.48 mg/dL — AB (ref 0.61–1.24)
Calcium: 8.7 mg/dL — ABNORMAL LOW (ref 8.9–10.3)
Chloride: 106 mmol/L (ref 98–111)
GFR calc non Af Amer: 48 mL/min — ABNORMAL LOW (ref >60)
GFR, EST AFRICAN AMERICAN: 56 mL/min — AB (ref >60)
Glucose, Bld: 268 mg/dL — ABNORMAL HIGH (ref 70–99)
Potassium: 4.3 mmol/L (ref 3.5–5.1)
Sodium: 140 mmol/L (ref 135–145)

## 2018-09-19 LAB — CBC
HCT: 30.4 % — ABNORMAL LOW (ref 39.0–52.0)
Hemoglobin: 9.7 g/dL — ABNORMAL LOW (ref 13.0–17.0)
MCH: 25.3 pg — ABNORMAL LOW (ref 26.0–34.0)
MCHC: 31.9 g/dL (ref 30.0–36.0)
MCV: 79.4 fL — ABNORMAL LOW (ref 80.0–100.0)
NRBC: 0 % (ref 0.0–0.2)
PLATELETS: 183 10*3/uL (ref 150–400)
RBC: 3.83 MIL/uL — ABNORMAL LOW (ref 4.22–5.81)
RDW: 14 % (ref 11.5–15.5)
WBC: 5.6 10*3/uL (ref 4.0–10.5)

## 2018-09-19 LAB — GLUCOSE, CAPILLARY
Glucose-Capillary: 184 mg/dL — ABNORMAL HIGH (ref 70–99)
Glucose-Capillary: 121 mg/dL — ABNORMAL HIGH (ref 70–99)
Glucose-Capillary: 234 mg/dL — ABNORMAL HIGH (ref 70–99)
Glucose-Capillary: 267 mg/dL — ABNORMAL HIGH (ref 70–99)

## 2018-09-19 NOTE — Progress Notes (Signed)
CSW received phone call from [facility] stating they were reviewing the patient early this morning.   CSW attempted to call for an update and left a v/m with their admissions for follow up.

## 2018-09-19 NOTE — Progress Notes (Signed)
PROGRESS NOTE  Michael [name] SWF:[id number] DOB: 05-08-1951 DOA: 09/16/2018 PCP: [name], PA-C   LOS: 0 days   Brief narrative:  Patient is a 67 years old male with past medical history of coronary artery disease status post CABG, HIV on Hart medications, ongoing cocaine abuse, hypertension, diabetes mellitus type 2 presented to the hospital with shortness of breath, cough, wheezing and some chest discomfort.  Patient did have some albuterol at home which have provided some relief but continued to have symptoms so he presented to the hospital.  He was admitted to hospital for acute bronchitis asthma exacerbation and suicidal ideation on the background of substance abuse and bipolar disorder.  Patient was placed on one-to-one sitter.  Patient was seen by psychiatry and recommended inpatient psychiatric facility transfer after medical stabilization.   Assessment/Plan:  Principal Problem:   MDD (major depressive disorder), recurrent severe, without psychosis ([place]) Active Problems:   HIV (human immunodeficiency virus infection) ([place])   Uncontrolled type 2 diabetes with neuropathy ([place])   Cocaine use disorder, severe, dependence ([place])   Essential hypertension   S/P CABG (coronary artery bypass graft)   Chronic kidney disease, stage III (moderate) ([name])   Acute bronchitis  Acute bronchitis/asthma exacerbation.  Patient denies any cigarette smoking.  Patient was continued on oxygen, nebulizer steroids with improvement in his overall breathing status though he is still symptomatic today he has significantly improved.  Patient will be continued steroids and nebulizers.  History of manic depressive disorder recurrent, cocaine abuse, generalized anxiety disorder.  Now with suicidal ideation.  Patient was seen by psychiatry Dr. Mariea Clonts.  Patient was initiated on trazodone and Zoloft as per psychiatry recommendation.  Patient will be continued on risperidone.  Psychiatry recommended  inpatient psychiatric hospitalization as the patient is stable.  Spoke with social worker about it- regarding inpatient psychiatric admission after medical stabilization.  Continue suicide precautions.  Medically Stable for discharge.  Diabetes mellitus type 2, uncontrolled.  Continue on Accu-Chek sliding scale, Lantus insulin, diabetic diet.  Will closely monitor.  Closely monitor on prednisone.  History of hypertension, coronary artery disease CABG.  No acute issues.  We will continue current medications.  Peripheral neuropathy.  Continue Neurontin.  HIV.  Continue on Odefsey  VTE Prophylaxis: Lovenox  Code Status:  Full code  Family Communication: No one present at bedside.  Disposition Plan: To inpatient psychiatric facility by tomorrow if bed is available.  Patient is improving medically and is stable for discharge to a psychiatry facility when bed is available.  patient will need inhalers for asthma on discharge.  I have informed the social worker regarding disposition./  Consultants:  Psychiatry  Procedures:  None  Antibiotics:  None  Subjective: Patient with no new complaints,  Still with congestion but no fever  Objective: Vitals:   09/19/18 1253 09/19/18 2004  BP: 137/79 (!) 174/100  Pulse: 78 88  Resp: 18 18  Temp:  (!) 97.1 F (36.2 C)  SpO2: 98% 99%    Intake/Output Summary (Last 24 hours) at 09/19/2018 2022 Last data filed at 09/19/2018 1930 Gross per 24 hour  Intake 784 ml  Output -  Net 784 ml   Filed Weights   09/16/18 2021 09/16/18 2310  Weight: 74.4 kg 74.4 kg   Physical Examination: General exam: Appears calm and comfortable ,Not in distress HEENT:PERRL,Oral mucosa moist Respiratory system: Bilateral equal air entry, normal vesicular breath sounds, minimal wheezes noted Cardiovascular system: S1 & S2 heard, RRR.  Gastrointestinal system: Abdomen  is nondistended, soft and nontender. No organomegaly or masses felt. Normal bowel sounds  heard. Central nervous system: Alert and oriented. No focal neurological deficits. Extremities: No edema, no clubbing ,no cyanosis, distal peripheral pulses palpable. Skin: No rashes, lesions or ulcers,no icterus ,no pallor MSK: Normal muscle bulk,tone ,power  Data Review: I have personally reviewed the following laboratory data and studies,  CBC: Recent Labs  Lab 09/16/18 2029 09/18/18 0531 09/19/18 0526  WBC 5.8 6.7 5.6  NEUTROABS 3.4  --   --   HGB 10.1* 8.9* 9.7*  HCT 34.2* 28.3* 30.4*  MCV 83.4 79.1* 79.4*  PLT 214 178 825   Basic Metabolic Panel: Recent Labs  Lab 09/16/18 2029 09/18/18 0531 09/19/18 0526  NA 136 138 140  K 3.9 4.0 4.3  CL 103 107 106  CO2 22 22 26   GLUCOSE 389* 221* 268*  BUN 20 31* 31*  CREATININE 1.70* 1.52* 1.48*  CALCIUM 8.8* 8.6* 8.7*   Liver Function Tests: Recent Labs  Lab 09/16/18 2029  AST 18  ALT 13  ALKPHOS 77  BILITOT 0.4  PROT 6.9  ALBUMIN 3.3*   No results for input(s): LIPASE, AMYLASE in the last 168 hours. No results for input(s): AMMONIA in the last 168 hours. Cardiac Enzymes: No results for input(s): CKTOTAL, CKMB, CKMBINDEX, TROPONINI in the last 168 hours. BNP (last 3 results) Recent Labs    02/20/18 1318 05/04/18 0237  BNP 83.0 36.1    ProBNP (last 3 results) No results for input(s): PROBNP in the last 8760 hours.  CBG: Recent Labs  Lab 09/18/18 1735 09/18/18 2208 09/19/18 0744 09/19/18 1200 09/19/18 1638  GLUCAP 275* 292* 184* 121* 234*   No results found for this or any previous visit (from the past 240 hour(s)).   Studies: No results found.  Scheduled Meds: . allopurinol  300 mg Oral Daily  . aspirin  81 mg Oral Daily  . emtricitabine-rilpivir-tenofovir AF  1 tablet Oral Q breakfast  . enoxaparin (LOVENOX) injection  40 mg Subcutaneous Daily  . insulin aspart  7 Units Subcutaneous TID WC  . insulin glargine  25 Units Subcutaneous QHS  . mometasone-formoterol  1 puff Inhalation BID  .  predniSONE  40 mg Oral Q breakfast  . risperiDONE  0.5 mg Oral Daily  . sertraline  50 mg Oral Daily  . traZODone  50 mg Oral QHS   Continuous Infusions:  Lady Deutscher  [facility] Pager [phone]  If 7PM-7AM, please contact night-coverage at [email or web address], password [email or web address] 09/19/2018, 8:22 PM

## 2018-09-20 DIAGNOSIS — F332 Major depressive disorder, recurrent severe without psychotic features: Secondary | ICD-10-CM | POA: Diagnosis not present

## 2018-09-20 LAB — GLUCOSE, CAPILLARY
Glucose-Capillary: 90 mg/dL (ref 70–99)
Glucose-Capillary: 118 mg/dL — ABNORMAL HIGH (ref 70–99)

## 2018-09-20 MED ORDER — BISACODYL 5 MG PO TBEC
5.0000 mg | DELAYED_RELEASE_TABLET | Freq: Every day | ORAL | Status: DC | PRN
  Administered 2018-09-20: 5 mg via ORAL
  Filled 2018-09-20: qty 1

## 2018-09-20 NOTE — Progress Notes (Signed)
Arrived to floor in wheel chair with sitter at bedside. Alert and oriented with flat affect. Denies needs at this time. Personal belongings (wallet and cell phone) in bag at desk in charge nurse drawer. Room set up for suicide precautions.

## 2018-09-20 NOTE — Plan of Care (Signed)
  Problem: Clinical Measurements: Goal: Ability to maintain clinical measurements within normal limits will improve Outcome: Progressing   Problem: Coping: Goal: Level of anxiety will decrease Outcome: Progressing   

## 2018-09-20 NOTE — H&P (Signed)
PROGRESS NOTE    Porter Moes  WFU:[id number] DOB: 1951/08/14 DOA: 09/16/2018 PCP: [name], PA-C   Brief Narrative:  Patient is a 67 years old male with past medical history of coronary artery disease status post CABG, HIV on Hart medications, ongoing cocaine abuse, hypertension, diabetes mellitus type 2 presented to the hospital with shortness of breath, cough, wheezing and some chest discomfort.  Patient did have some albuterol at home which have provided some relief but continued to have symptoms so he presented to the hospital.  He was admitted to hospital for acute bronchitis asthma exacerbation and suicidal ideation on the background of substance abuse and bipolar disorder.  Patient was placed on one-to-one sitter.  Patient was seen by psychiatry and recommended inpatient psychiatric facility transfer after medical stabilization.    Assessment & Plan:   Principal Problem:   MDD (major depressive disorder), recurrent severe, without psychosis ([place]) Active Problems:   HIV (human immunodeficiency virus infection) ([place])   Uncontrolled type 2 diabetes with neuropathy ([place])   Cocaine use disorder, severe, dependence ([place])   Essential hypertension   S/P CABG (coronary artery bypass graft)   Chronic kidney disease, stage III (moderate) ([name])   Acute bronchitis   Acute bronchitis/asthma exacerbation.  Patient denies any cigarette smoking.  Patient was continued on oxygen, nebulizer steroids with improvement in his overall breathing status though he is still symptomatic today he has significantly improved.  Patient will be continued steroids and nebulizers.  History of manic depressive disorder recurrent, cocaine abuse, generalized anxiety disorder.  Now with suicidal ideation.  Patient was seen by psychiatry Dr. Mariea Clonts.  Patient was initiated on trazodone and Zoloft as per psychiatry recommendation.  Patient will be continued on risperidone.  Psychiatry recommended  inpatient psychiatric hospitalization as the patient is stable.  Spoke with social worker about it- regarding inpatient psychiatric admission after medical stabilization.  Continue suicide precautions.  Medically Stable for discharge.  Diabetes mellitus type 2, uncontrolled.  Continue on Accu-Chek sliding scale, Lantus insulin, diabetic diet.  Will closely monitor.  Closely monitor on prednisone.  History of hypertension, coronary artery disease CABG.  No acute issues.  We will continue current medications.  Peripheral neuropathy.  Continue Neurontin.  HIV.  Continue on Odefsey  VTE Prophylaxis: Lovenox  Code Status:  Full code  Family Communication: No one present at bedside.  Disposition Plan: To inpatient psychiatric facility by tomorrow if bed is available.  Patient is improving medically and is stable for discharge to a psychiatry facility when bed is available.  patient will need inhalers for asthma on discharge.  I have informed the social worker regarding disposition./  Consultants:  Psychiatry  Procedures:  None  Antibiotics:  None  Subjective: Patient with no new complaints bronchitis symptoms improving.  Objective: Vitals:   09/19/18 2004 09/20/18 0050 09/20/18 0500 09/20/18 1556  BP: (!) 174/100 (!) 143/92 (!) 149/104 123/86  Pulse: 88 67 76 84  Resp: 18 16 16 16   Temp: (!) 97.1 F (36.2 C) 97.8 F (36.6 C) 98.8 F (37.1 C) 97.7 F (36.5 C)  TempSrc: Axillary Oral Axillary Oral  SpO2: 99% 98% 100% 100%  Weight:      Height:        Intake/Output Summary (Last 24 hours) at 09/20/2018 1612 Last data filed at 09/20/2018 1300 Gross per 24 hour  Intake 1052 ml  Output -  Net 1052 ml   Filed Weights   09/16/18 2021 09/16/18 2310  Weight: 74.4 kg  74.4 kg    Examination:  General exam: Appears calm and comfortable  Respiratory system: Clear to auscultation. Respiratory effort normal. Cardiovascular system: S1 & S2 heard, RRR. No JVD,  murmurs, rubs, gallops or clicks. No pedal edema. Gastrointestinal system: Abdomen is nondistended, soft and nontender. No organomegaly or masses felt. Normal bowel sounds heard. Central nervous system: Alert and oriented. No focal neurological deficits. Extremities: Symmetric 5 x 5 power. Skin: No rashes, lesions or ulcers Psychiatry: Judgement and insight appear normal. Mood & affect appropriate.     Data Reviewed: I have personally reviewed following labs and imaging studies  CBC: Recent Labs  Lab 09/16/18 2029 09/18/18 0531 09/19/18 0526  WBC 5.8 6.7 5.6  NEUTROABS 3.4  --   --   HGB 10.1* 8.9* 9.7*  HCT 34.2* 28.3* 30.4*  MCV 83.4 79.1* 79.4*  PLT 214 178 811   Basic Metabolic Panel: Recent Labs  Lab 09/16/18 2029 09/18/18 0531 09/19/18 0526  NA 136 138 140  K 3.9 4.0 4.3  CL 103 107 106  CO2 22 22 26   GLUCOSE 389* 221* 268*  BUN 20 31* 31*  CREATININE 1.70* 1.52* 1.48*  CALCIUM 8.8* 8.6* 8.7*   GFR: Estimated Creatinine Clearance: 46.2 mL/min (A) (by C-G formula based on SCr of 1.48 mg/dL (H)). Liver Function Tests: Recent Labs  Lab 09/16/18 2029  AST 18  ALT 13  ALKPHOS 77  BILITOT 0.4  PROT 6.9  ALBUMIN 3.3*   No results for input(s): LIPASE, AMYLASE in the last 168 hours. No results for input(s): AMMONIA in the last 168 hours. Coagulation Profile: No results for input(s): INR, PROTIME in the last 168 hours. Cardiac Enzymes: No results for input(s): CKTOTAL, CKMB, CKMBINDEX, TROPONINI in the last 168 hours. BNP (last 3 results) No results for input(s): PROBNP in the last 8760 hours. HbA1C: No results for input(s): HGBA1C in the last 72 hours. CBG: Recent Labs  Lab 09/19/18 1200 09/19/18 1638 09/19/18 2101 09/20/18 0843 09/20/18 1226  GLUCAP 121* 234* 267* 90 118*   Lipid Profile: No results for input(s): CHOL, HDL, LDLCALC, TRIG, CHOLHDL, LDLDIRECT in the last 72 hours. Thyroid Function Tests: No results for input(s): TSH, T4TOTAL,  FREET4, T3FREE, THYROIDAB in the last 72 hours. Anemia Panel: No results for input(s): VITAMINB12, FOLATE, FERRITIN, TIBC, IRON, RETICCTPCT in the last 72 hours. Sepsis Labs: No results for input(s): PROCALCITON, LATICACIDVEN in the last 168 hours.  No results found for this or any previous visit (from the past 240 hour(s)).       Radiology Studies: No results found.      Scheduled Meds: . allopurinol  300 mg Oral Daily  . aspirin  81 mg Oral Daily  . emtricitabine-rilpivir-tenofovir AF  1 tablet Oral Q breakfast  . enoxaparin (LOVENOX) injection  40 mg Subcutaneous Daily  . insulin aspart  7 Units Subcutaneous TID WC  . insulin glargine  25 Units Subcutaneous QHS  . mometasone-formoterol  1 puff Inhalation BID  . predniSONE  40 mg Oral Q breakfast  . risperiDONE  0.5 mg Oral Daily  . sertraline  50 mg Oral Daily  . traZODone  50 mg Oral QHS   Continuous Infusions:   LOS: 0 days    Time spent: 25 minutes    Lady Deutscher, MD FACP [facility] Pager [phone]  If 7PM-7AM, please contact night-coverage [email or web address] Password [email or web address] 09/20/2018, 4:12 PM

## 2018-09-20 NOTE — ED Notes (Signed)
Report given to Olivia Mackie, RN on 6N.  Valuables will be sent w/ sitter.  Valuables include wallet and cell phone.

## 2018-09-21 DIAGNOSIS — B20 Human immunodeficiency virus [HIV] disease: Secondary | ICD-10-CM | POA: Diagnosis not present

## 2018-09-21 DIAGNOSIS — E440 Moderate protein-calorie malnutrition: Secondary | ICD-10-CM | POA: Diagnosis present

## 2018-09-21 DIAGNOSIS — F331 Major depressive disorder, recurrent, moderate: Secondary | ICD-10-CM | POA: Diagnosis not present

## 2018-09-21 DIAGNOSIS — F1420 Cocaine dependence, uncomplicated: Secondary | ICD-10-CM | POA: Diagnosis not present

## 2018-09-21 DIAGNOSIS — F332 Major depressive disorder, recurrent severe without psychotic features: Secondary | ICD-10-CM | POA: Diagnosis not present

## 2018-09-21 LAB — GLUCOSE, CAPILLARY
GLUCOSE-CAPILLARY: 296 mg/dL — AB (ref 70–99)
Glucose-Capillary: 304 mg/dL — ABNORMAL HIGH (ref 70–99)
Glucose-Capillary: 167 mg/dL — ABNORMAL HIGH (ref 70–99)
Glucose-Capillary: 245 mg/dL — ABNORMAL HIGH (ref 70–99)
Glucose-Capillary: 350 mg/dL — ABNORMAL HIGH (ref 70–99)
Glucose-Capillary: 330 mg/dL — ABNORMAL HIGH (ref 70–99)

## 2018-09-21 MED ORDER — GLUCERNA SHAKE PO LIQD
237.0000 mL | Freq: Three times a day (TID) | ORAL | Status: DC
  Administered 2018-09-21 – 2018-09-22 (×4): 237 mL via ORAL

## 2018-09-21 MED ORDER — TRAZODONE HCL 50 MG PO TABS: 50.0000 mg | ORAL_TABLET | Freq: Every day | ORAL | 0 refills | Status: DC

## 2018-09-21 MED ORDER — ACETAMINOPHEN 325 MG PO TABS
650.0000 mg | ORAL_TABLET | Freq: Four times a day (QID) | ORAL | Status: DC | PRN
  Administered 2018-09-21: 650 mg via ORAL
  Filled 2018-09-21: qty 2

## 2018-09-21 MED ORDER — ADULT MULTIVITAMIN W/MINERALS CH
1.0000 | ORAL_TABLET | Freq: Every day | ORAL | Status: DC
  Administered 2018-09-21 – 2018-09-22 (×2): 1 via ORAL
  Filled 2018-09-21 (×2): qty 1

## 2018-09-21 MED ORDER — SERTRALINE HCL 50 MG PO TABS: 50.0000 mg | ORAL_TABLET | Freq: Every day | ORAL | 0 refills | Status: DC

## 2018-09-21 NOTE — Social Work (Signed)
CSW spoke with pt at bedside, he has been cleared by Dr. Mariea Clonts. I introduced self, role, reason for visit. Let pt know he had been cleared and that I had been asked to give him information on how to f/u with mental health providers on an outpatient basis.   Pt stated "I don't think that's a good idea, I know how my mind works." CSW requested that pt elaborate on that statement. Pt again repeated "I know how my mind works." CSW explained that outpatient follow up was recommended for pt to develop healthy coping skills, receive ongoing support through talk therapy and medication management and that it is important for pt's even if they go to inpatient psychiatric facilities to f/u on an outpatient basis.   CSW acknowledged that pt does not currently have a stable place to stay and that inpatient psychiatric facilities are not intended to serve as permanent housing either and that pt would again need to reach out to an outpatient provider for ongoing support.   When CSW asked if pt had a plan to harm himself or someone else the pt states that "I am going to walk in front of a car." CSW acknowledged that those thoughts must be distressing and that I would f/u with care team and psychiatrist. Pt acknowledged that it is up to the discretion of the care team as to if they feel that he could be best supported on an outpatient basis and that if he was again cleared that we cannot keep him here at the hospital.  CSW left resources (substance use and outpatient psychiatry packet, single adult shelters, and two bus passes) on the chart.  F/u with Dr. Evangeline Gula and Dr. Mariea Clonts.  Dr. Mariea Clonts requested another order to be placed for pt to be seen by psychiatry tomorrow. Dr. Mariea Clonts is aware that CSW had provided pt with resources.  Continuing to follow. RN Production assistant, radio made aware of all updates.   Westley Hummer, MSW, [place] [facility] [phone]

## 2018-09-21 NOTE — Progress Notes (Signed)
Initial Nutrition Assessment  DOCUMENTATION CODES:   Non-severe (moderate) malnutrition in context of chronic illness  INTERVENTION:   -Glucerna Shake po TID, each supplement provides 220 kcal and 10 grams of protein -MVI with minerals daily  NUTRITION DIAGNOSIS:   Moderate Malnutrition related to chronic illness(DM, ongoing cocaine abuse, HIV) as evidenced by mild fat depletion, moderate fat depletion, mild muscle depletion, moderate muscle depletion.  GOAL:   Patient will meet greater than or equal to 90% of their needs  MONITOR:   PO intake, Supplement acceptance, Labs, Weight trends, Skin, I & O's  REASON FOR ASSESSMENT:   Malnutrition Screening Tool, Consult Assessment of nutrition requirement/status  ASSESSMENT:   Patient is a 67 years old male with past medical history of coronary artery disease status post CABG, HIV on Hart medications, ongoing cocaine abuse, hypertension, diabetes mellitus type 2 presented to the hospital with shortness of breath, cough, wheezing and some chest discomfort.  Patient did have some albuterol at home which have provided some relief but continued to have symptoms so he presented to the hospital.  He was admitted to hospital for acute bronchitis asthma exacerbation and suicidal ideation on the background of substance abuse and bipolar disorder.  Patient was placed on one-to-one sitter.  Patient was seen by psychiatry and recommended inpatient psychiatric facility transfer after medical stabilization.   Pt admitted with acute bronchitis.   Of note, consult was ordered on 09/16/18 at 2245, when pt was holding in ED. Pt did not transfer to floor until 09/20/18 at 0138, so RD unable to complete consult within 48 hour time period.   Case discussed with RN prior to visit, who reports pt with good appetite and takes medications without difficulty. Pt is awaiting psychiatric placement.  Spoke with pt, who reports variable appetite PTA. He shares that  he has been eating well since being in the hospital (pt admitted on 09/16/18). He reports she typically consumes 3 meals per day (Breakfast: grits, eggs, toast, and coffee; Lunch: sandwich; Dinner: meat, starch, and vegetable). Noted pt consumed 75% of his pot roast and mashed potato meal.   Pt reports a 20-30# wt loss over the past 3 months. He attributes wt loss due to variable appetite and uncontrolled DM. Reviewed wt hx, pt has experienced a 4.5% wt loss over the past 6 months, which is not significant for time frame. Per pt, he has had variable CBGS, but did not provide any explanation as to why. Pt denies difficulty obtaining medications.   Discussed with pt importance of good meal and supplement intake to promote healing. Pt amenable to try supplements while in the hospital.   Last Hgb A1c: 9.7 (05/04/18). PTA DM medications are 30 units insulin glargine daily, 500 mg metformin BID, and 5 units insulin lispro TID.   Labs reviewed: CBGS: 90-304 (inpatient orders for glycemic control are 7 units insulin aspart TID with meals and 25 units insulin glargine q HS).   NUTRITION - FOCUSED PHYSICAL EXAM:    Most Recent Value  Orbital Region  Moderate depletion  Upper Arm Region  Mild depletion  Thoracic and Lumbar Region  No depletion  Buccal Region  No depletion  Temple Region  Moderate depletion  Clavicle Bone Region  Mild depletion  Clavicle and Acromion Bone Region  No depletion  Scapular Bone Region  No depletion  Dorsal Hand  Mild depletion  Patellar Region  No depletion  Anterior Thigh Region  No depletion  Posterior Calf Region  No depletion  Edema (RD Assessment)  Mild  Hair  Reviewed  Eyes  Reviewed  Mouth  Reviewed  Skin  Reviewed  Nails  Reviewed       Diet Order:   Diet Order            Diet - low sodium heart healthy        Diet heart healthy/carb modified Room service appropriate? Yes; Fluid consistency: Thin  Diet effective now              EDUCATION NEEDS:    Education needs have been addressed  Skin:  Skin Assessment: Reviewed RN Assessment  Last BM:  09/15/18  Height:   Ht Readings from Last 1 Encounters:  09/16/18 5\' 8"  (1.727 m)    Weight:   Wt Readings from Last 1 Encounters:  09/16/18 74.4 kg    Ideal Body Weight:  70 kg  BMI:  Body mass index is 24.94 kg/m.  Estimated Nutritional Needs:   Kcal:  2200-2400  Protein:  110-125 grams  Fluid:  2.2-2.4 L    Corben Auzenne A. Jimmye Norman, RD, LDN, CDE Pager: [phone] After hours Pager: [phone]

## 2018-09-21 NOTE — Progress Notes (Signed)
Occupational Therapy Treatment Patient Details Name: Michael [name] MRN: [id number] DOB: 05/29/1951 Today's Date: 09/21/2018    History of present illness Michael [name] is a 67 y.o. male with medical history significant of CAD S/P CABG, preserved EF as of July echo, HIV on HAART, ongoing cocaine abuse, HTN, DM2. Presents to ED for SOB   OT comments  Pt performing bed mobility with Modified Independence; ADL functional transfers with SupervisionA; ADL functional mobility with RW in room and performed ADL functional mobility with handheld assist a community distance with [place]. Pt educated on and demonstrated LB dressing with reacher and sock aid with good carry over skills requiring visual and verbal cues to perform task with Modified Independence sitting edge of bed. Pt continues to progress. No follow-up OT services required for post acute care.   Follow Up Recommendations  No OT follow up    Equipment Recommendations       Recommendations for Other Services      Precautions / Restrictions Precautions Precautions: Fall Restrictions Weight Bearing Restrictions: No       Mobility Bed Mobility Overal bed mobility: Modified Independent                Transfers Overall transfer level: Needs assistance Equipment used: 1 person hand held assist Transfers: Sit to/from Stand Sit to Stand: Min guard         General transfer comment: for safety and immediate standing balance    Balance Overall balance assessment: Mild deficits observed, not formally tested                                         ADL either performed or assessed with clinical judgement   ADL Overall ADL's : Needs assistance/impaired Eating/Feeding: Independent   Grooming: Wash/dry hands;Wash/dry face;Standing;Supervision/safety   Upper Body Bathing: Modified independent   Lower Body Bathing: Min guard;Supervison/ safety   Upper Body Dressing : Supervision/safety    Lower Body Dressing: Minimal assistance   Toilet Transfer: Min guard;Ambulation;Grab bars   Toileting- Clothing Manipulation and Hygiene: Supervision/safety   Tub/ Banker: Min guard;Ambulation;Grab bars   Functional mobility during ADLs: Min guard       Vision   Vision Assessment?: No apparent visual deficits   Perception     Praxis      Cognition Arousal/Alertness: Awake/alert Behavior During Therapy: WFL for tasks assessed/performed Overall Cognitive Status: Within Functional Limits for tasks assessed                                          Exercises     Shoulder Instructions       General Comments      Pertinent Vitals/ Pain       Pain Assessment: 0-10 Pain Score: 0-No pain Pain Location: chest/ribs Pain Intervention(s): Monitored during session  Home Living                                          Prior Functioning/Environment              Frequency  Min 2X/week        Progress Toward Goals  OT Goals(current goals can now be found  in the care plan section)     Acute Rehab OT Goals Patient Stated Goal: get better ADL Goals Additional ADL Goal #1: Pt will complete ADL transfers to toilet and shower with sup - Mod I Additional ADL Goal #2: Pt will complete LB ADLs with A/E trg with Mod I  Plan Discharge plan remains appropriate    Co-evaluation                 AM-PAC OT "6 Clicks" Daily Activity     Outcome Measure   Help from another person eating meals?: None Help from another person taking care of personal grooming?: A Little Help from another person toileting, which includes using toliet, bedpan, or urinal?: A Little Help from another person bathing (including washing, rinsing, drying)?: A Little Help from another person to put on and taking off regular upper body clothing?: A Little Help from another person to put on and taking off regular lower body clothing?: A Little 6 Click  Score: 19    End of Session Equipment Utilized During Treatment: Gait belt;Rolling walker  OT Visit Diagnosis: Unsteadiness on feet (R26.81);Muscle weakness (generalized) (M62.81)   Activity Tolerance Patient tolerated treatment well   Patient Left in chair;with call bell/phone within reach;with nursing/sitter in room   Nurse Communication          Time: 5397-6734 OT Time Calculation (min): 29 min  Charges: OT General Charges $OT Visit: 1 Visit OT Treatments $Self Care/Home Management : 8-22 mins $Therapeutic Activity: 8-22 mins  Darryl Nestle) Marsa Aris OTR/L Acute Rehabilitation Services Pager: [phone] Office: [phone]  Fredda Hammed 09/21/2018, 1:21 PM

## 2018-09-21 NOTE — Progress Notes (Signed)
Pt expressing SI with plan to [facility].  Will NOT discharge.  Will re-consult psychiatry.

## 2018-09-21 NOTE — Social Work (Signed)
CSW has followed up with [facility], unable to reach admissions.  After chart review CSW has noted that pt has not had a psychiatric evaluation since 12/12. Will need a more recent evaluation for placement in inpatient psych facility.  Westley Hummer, MSW, [place] Work [phone]

## 2018-09-21 NOTE — Discharge Summary (Signed)
Physician Discharge Summary  Michael [name] XAJ:[id number] DOB: 10/05/1951 DOA: 09/16/2018  PCP: [name], PA-C  Admit date: 09/16/2018 Discharge date: 09/21/2018  Admitted From: Home Disposition: Home  Recommendations for Outpatient Follow-up:  1. Follow up with PCP in 1-2 weeks 2. He can keep follow-up appointment with psychiatry as directed 3. Please obtain BMP/CBC in one week 4. Please follow up on the following pending results:  Home Health: No Equipment/Devices: None  Discharge Condition: Stable CODE STATUS: Full code Diet recommendation: Heart healthy carb modified  Brief/Interim Summary:  Patient is a 67 years old male with past medical history of coronary artery disease status post CABG, HIV on [place] medications, ongoing cocaine abuse, hypertension, diabetes mellitus type 2 presented to the hospital with shortness of breath, cough, wheezing and some chest discomfort. Patient did have some albuterol at home which have provided some relief but continued to have symptoms so he presented to the hospital. He was admitted to hospital for acute bronchitis asthma exacerbation and suicidal ideation on the background of substance abuse and bipolar disorder. Patient was placed on one-to-one sitter. Patient was seen by psychiatry and recommended inpatient psychiatric facility transfer after medical stabilization.   Patient was reevaluated today by psychiatry and deemed stable for discharge and no longer with a suicidal plan.  He does have fleeting suicidal ideation but he was cleared by them to discharge home and therefore he is stable for discharge home with outpatient follow-up.  Patient has reached maximal benefit of hospitalization.  Discharge diagnosis, prognosis, plans, follow-up, medications and treatments discussed with the patient(or responsible party) and is in agreement with the plans as described.  Patient is stable for discharge.  Discharge Diagnoses:   Principal Problem:   MDD (major depressive disorder), recurrent episode, moderate ([name]) Active Problems:   HIV (human immunodeficiency virus infection) ([place])   Uncontrolled type 2 diabetes with neuropathy ([name])   Cocaine use disorder, severe, dependence ([place])   MDD (major depressive disorder), recurrent severe, without psychosis ([place])   Essential hypertension   S/P CABG (coronary artery bypass graft)   Chronic kidney disease, stage III (moderate) ([place])   Acute bronchitis    Discharge Instructions  Discharge Instructions    Call MD for:  temperature >100.4   Complete by:  As directed    Diet - low sodium heart healthy   Complete by:  As directed    Discharge instructions   Complete by:  As directed    Make and keep follow-up appointment with psychiatry.   Increase activity slowly   Complete by:  As directed      Allergies as of 09/21/2018   No Known Allergies     Medication List    TAKE these medications   ACCU-CHEK SOFTCLIX LANCETS lancets USE TO CHECK BLOOD GLUCOSE LEVELS UP TO 4 TIMES DAILY AS DIRECTED   albuterol 108 (90 Base) MCG/ACT inhaler Commonly known as:  PROAIR HFA INHALE TWO PUFFS INTO THE LUNGS EVERY 6 (SIX) HOURS AS NEEDED FOR WHEEZING OR SHORTNESS OF BREATH. What changed:    how much to take  how to take this  when to take this  reasons to take this  additional instructions   allopurinol 300 MG tablet Commonly known as:  ZYLOPRIM Take 1 tablet (300 mg total) by mouth daily. For gout.   aspirin 81 MG chewable tablet Chew 81 mg by mouth daily.   emtricitabine-rilpivir-tenofovir AF 200-25-25 MG Tabs tablet Commonly known as:  ODEFSEY Take 1 tablet by mouth  daily with breakfast. For HIV infection   gabapentin 400 MG capsule Commonly known as:  NEURONTIN Take 1 capsule (400 mg total) by mouth 2 (two) times daily. For agitation/neuropathic pain What changed:    when to take this  reasons to take this  additional instructions    hydrOXYzine 25 MG tablet Commonly known as:  ATARAX/VISTARIL Take 1 tablet (25 mg total) by mouth 3 (three) times daily as needed for anxiety.   insulin glargine 100 UNIT/ML injection Commonly known as:  LANTUS Inject 0.3 mLs (30 Units total) into the skin at bedtime. For diabetes management   insulin lispro 100 UNIT/ML injection Commonly known as:  HUMALOG Inject 0.05 mLs (5 Units total) into the skin 3 (three) times daily with meals. For diabetes management   Insulin Syringe-Needle U-100 27G X 5/8" 1 ML Misc Commonly known as:  B-D INS SYR MICROFINE 1CC/27G 1 strip by Does not apply route 3 (three) times daily as needed. For blood sugar checks   metFORMIN 500 MG tablet Commonly known as:  GLUCOPHAGE Take 1 tablet (500 mg total) by mouth 2 (two) times daily with a meal.   risperiDONE 0.5 MG tablet Commonly known as:  RISPERDAL Take 0.5 mg by mouth daily.   sertraline 50 MG tablet Commonly known as:  ZOLOFT Take 1 tablet (50 mg total) by mouth daily. Start taking on:  September 22, 2018   traZODone 50 MG tablet Commonly known as:  DESYREL Take 1 tablet (50 mg total) by mouth at bedtime.       No Known Allergies  Consultations:  Psychiatry   Procedures/Studies: Dg Chest 2 View  Result Date: 09/16/2018 CLINICAL DATA:  Mildly productive cough. Shortness of breath. EXAM: CHEST - 2 VIEW COMPARISON:  06/08/2018 and chest CT dated 09/02/2018. FINDINGS: Normal sized heart. Tortuous aorta. Stable left atrial appendage clip and coronary artery stent. Post CABG changes are again demonstrated. Stable small amount of linear scarring in the lingula or right middle lobe and small amount of linear scarring in the right perihilar region. Cervical spine fixation hardware. IMPRESSION: No acute abnormality. Electronically Signed   By: Claudie Revering M.D.   On: 09/16/2018 21:21   Ct Head Wo Contrast  Result Date: 09/02/2018 CLINICAL DATA:  Pain following fall EXAM: CT HEAD WITHOUT  CONTRAST CT CERVICAL SPINE WITHOUT CONTRAST TECHNIQUE: Multidetector CT imaging of the head and cervical spine was performed following the standard protocol without intravenous contrast. Multiplanar CT image reconstructions of the cervical spine were also generated. COMPARISON:  Head CT and cervical spine CT December 16, 2016 FINDINGS: CT HEAD FINDINGS Brain: Mild diffuse atrophy is stable. There is a 7 x 6 mm lipoma in the midline at the level of the third ventricle, stable. No other mass is seen. There is no hemorrhage, extra-axial fluid collection, or midline shift. There is patchy small vessel disease in the centra semiovale bilaterally. No acute infarct is demonstrable on this study. Vascular: There is no appreciable hyperdense vessel. There is mild calcification in the left carotid siphon region. Skull: The bony calvarium appears intact. Sinuses/Orbits: There is mucosal thickening involving several ethmoid air cells. Orbits appear symmetric bilaterally. Other: Mastoid air cells are clear. CT CERVICAL SPINE FINDINGS Alignment: There is no appreciable spondylolisthesis. Skull base and vertebrae: Skull base and craniocervical junction regions appear normal. The patient is status post anterior screw and plate fixation from J0-K9 with support hardware intact. No fracture evident. No blastic or lytic bone lesions. Soft tissues and spinal canal:  Prevertebral soft tissues and predental space regions are normal. No paraspinous lesions are evident. There is no evident cord or canal hematoma. Disc levels: There is ankylosis at C3-4, C4-5, C5-6, and C6-7. There is moderate disc space narrowing at C7-T1. There is facet hypertrophy at multiple levels. There is no frank disc extrusion or stenosis on this study. Upper chest: The visualized upper lung zones are clear. Other: There are foci of carotid artery calcification bilaterally. IMPRESSION: CT head: Stable atrophy with periventricular small vessel disease. No acute infarct.  Stable subcentimeter lipoma at the level of the third ventricle in the midline, not causing hydrocephalus. No other mass. No hemorrhage. Mild arterial vascular calcification noted. There is mucosal thickening involving several ethmoid air cells. CT cervical spine: No fracture or spondylolisthesis. Postoperative change from C3-C7 with support hardware intact. Multilevel arthropathy noted without disc extrusion or stenosis evident. Foci of carotid artery calcification noted bilaterally. Electronically Signed   By: Lowella Grip III M.D.   On: 09/02/2018 08:31   Ct Chest W Contrast  Result Date: 09/02/2018 CLINICAL DATA:  Moderate blunt trauma. EXAM: CT CHEST, ABDOMEN, AND PELVIS WITH CONTRAST TECHNIQUE: Multidetector CT imaging of the chest, abdomen and pelvis was performed following the standard protocol during bolus administration of intravenous contrast. CONTRAST:  182mL ISOVUE-300 IOPAMIDOL (ISOVUE-300) INJECTION 61% COMPARISON:  CT scan of Feb 22, 2018. FINDINGS: CT CHEST FINDINGS Cardiovascular: No evidence of thoracic aortic dissection or aneurysm. Status post coronary artery bypass graft. Normal cardiac size. No pericardial effusion is noted. Mediastinum/Nodes: No enlarged mediastinal, hilar, or axillary lymph nodes. Thyroid gland, trachea, and esophagus demonstrate no significant findings. Lungs/Pleura: No pneumothorax or pleural effusion is noted. Mild right posterior basilar subsegmental atelectasis or infiltrate is noted. Left lung is clear. Musculoskeletal: No chest wall mass or suspicious bone lesions identified. CT ABDOMEN PELVIS FINDINGS Hepatobiliary: No focal liver abnormality is seen. No gallstones, gallbladder wall thickening, or biliary dilatation. Pancreas: Unremarkable. No pancreatic ductal dilatation or surrounding inflammatory changes. Spleen: Normal in size without focal abnormality. Adrenals/Urinary Tract: Adrenal glands are unremarkable. Kidneys are normal, without renal calculi,  focal lesion, or hydronephrosis. Bladder is unremarkable. Stomach/Bowel: Stomach is within normal limits. Appendix appears normal. No evidence of bowel wall thickening, distention, or inflammatory changes. Vascular/Lymphatic: No significant vascular findings are present. No enlarged abdominal or pelvic lymph nodes. Reproductive: Prostate is unremarkable. Other: No abdominal wall hernia or abnormality. No abdominopelvic ascites. Musculoskeletal: No acute or significant osseous findings. IMPRESSION: No evidence of traumatic injury seen in the chest, abdomen or pelvis. Mild right posterior basilar subsegmental atelectasis or infiltrate is noted. No other abnormality is noted. Electronically Signed   By: Marijo Conception, M.D.   On: 09/02/2018 08:47   Ct Cervical Spine Wo Contrast  Result Date: 09/02/2018 CLINICAL DATA:  Pain following fall EXAM: CT HEAD WITHOUT CONTRAST CT CERVICAL SPINE WITHOUT CONTRAST TECHNIQUE: Multidetector CT imaging of the head and cervical spine was performed following the standard protocol without intravenous contrast. Multiplanar CT image reconstructions of the cervical spine were also generated. COMPARISON:  Head CT and cervical spine CT December 16, 2016 FINDINGS: CT HEAD FINDINGS Brain: Mild diffuse atrophy is stable. There is a 7 x 6 mm lipoma in the midline at the level of the third ventricle, stable. No other mass is seen. There is no hemorrhage, extra-axial fluid collection, or midline shift. There is patchy small vessel disease in the centra semiovale bilaterally. No acute infarct is demonstrable on this study. Vascular: There is no appreciable  hyperdense vessel. There is mild calcification in the left carotid siphon region. Skull: The bony calvarium appears intact. Sinuses/Orbits: There is mucosal thickening involving several ethmoid air cells. Orbits appear symmetric bilaterally. Other: Mastoid air cells are clear. CT CERVICAL SPINE FINDINGS Alignment: There is no appreciable  spondylolisthesis. Skull base and vertebrae: Skull base and craniocervical junction regions appear normal. The patient is status post anterior screw and plate fixation from H7-W2 with support hardware intact. No fracture evident. No blastic or lytic bone lesions. Soft tissues and spinal canal: Prevertebral soft tissues and predental space regions are normal. No paraspinous lesions are evident. There is no evident cord or canal hematoma. Disc levels: There is ankylosis at C3-4, C4-5, C5-6, and C6-7. There is moderate disc space narrowing at C7-T1. There is facet hypertrophy at multiple levels. There is no frank disc extrusion or stenosis on this study. Upper chest: The visualized upper lung zones are clear. Other: There are foci of carotid artery calcification bilaterally. IMPRESSION: CT head: Stable atrophy with periventricular small vessel disease. No acute infarct. Stable subcentimeter lipoma at the level of the third ventricle in the midline, not causing hydrocephalus. No other mass. No hemorrhage. Mild arterial vascular calcification noted. There is mucosal thickening involving several ethmoid air cells. CT cervical spine: No fracture or spondylolisthesis. Postoperative change from C3-C7 with support hardware intact. Multilevel arthropathy noted without disc extrusion or stenosis evident. Foci of carotid artery calcification noted bilaterally. Electronically Signed   By: Lowella Grip III M.D.   On: 09/02/2018 08:31   Ct Abdomen Pelvis W Contrast  Result Date: 09/02/2018 CLINICAL DATA:  Moderate blunt trauma. EXAM: CT CHEST, ABDOMEN, AND PELVIS WITH CONTRAST TECHNIQUE: Multidetector CT imaging of the chest, abdomen and pelvis was performed following the standard protocol during bolus administration of intravenous contrast. CONTRAST:  149mL ISOVUE-300 IOPAMIDOL (ISOVUE-300) INJECTION 61% COMPARISON:  CT scan of Feb 22, 2018. FINDINGS: CT CHEST FINDINGS Cardiovascular: No evidence of thoracic aortic  dissection or aneurysm. Status post coronary artery bypass graft. Normal cardiac size. No pericardial effusion is noted. Mediastinum/Nodes: No enlarged mediastinal, hilar, or axillary lymph nodes. Thyroid gland, trachea, and esophagus demonstrate no significant findings. Lungs/Pleura: No pneumothorax or pleural effusion is noted. Mild right posterior basilar subsegmental atelectasis or infiltrate is noted. Left lung is clear. Musculoskeletal: No chest wall mass or suspicious bone lesions identified. CT ABDOMEN PELVIS FINDINGS Hepatobiliary: No focal liver abnormality is seen. No gallstones, gallbladder wall thickening, or biliary dilatation. Pancreas: Unremarkable. No pancreatic ductal dilatation or surrounding inflammatory changes. Spleen: Normal in size without focal abnormality. Adrenals/Urinary Tract: Adrenal glands are unremarkable. Kidneys are normal, without renal calculi, focal lesion, or hydronephrosis. Bladder is unremarkable. Stomach/Bowel: Stomach is within normal limits. Appendix appears normal. No evidence of bowel wall thickening, distention, or inflammatory changes. Vascular/Lymphatic: No significant vascular findings are present. No enlarged abdominal or pelvic lymph nodes. Reproductive: Prostate is unremarkable. Other: No abdominal wall hernia or abnormality. No abdominopelvic ascites. Musculoskeletal: No acute or significant osseous findings. IMPRESSION: No evidence of traumatic injury seen in the chest, abdomen or pelvis. Mild right posterior basilar subsegmental atelectasis or infiltrate is noted. No other abnormality is noted. Electronically Signed   By: Marijo Conception, M.D.   On: 09/02/2018 08:47       Subjective: Patient is feeling better he is recovering from his upper respiratory infection but no longer requires any inpatient treatment for it.  He has been cleared by psychiatry. Discharge Exam: Vitals:   09/21/18 6378 09/21/18 1430  BP: 123/78 113/69  Pulse: 60 89  Resp: 18 16   Temp: 98.4 F (36.9 C) 98.4 F (36.9 C)  SpO2: 100% 98%   Vitals:   09/20/18 2115 09/21/18 0629 09/21/18 0633 09/21/18 1430  BP: (!) 144/88 123/78 123/78 113/69  Pulse: 85 89 60 89  Resp: 19 18 18 16   Temp: 98.2 F (36.8 C) 98.4 F (36.9 C) 98.4 F (36.9 C) 98.4 F (36.9 C)  TempSrc: Oral Oral Oral Oral  SpO2: 100% 100% 100% 98%  Weight:      Height:        General: Pt is alert, awake, not in acute distress Cardiovascular: RRR, S1/S2 +, no rubs, no gallops Respiratory: CTA bilaterally, no wheezing, no rhonchi Abdominal: Soft, NT, ND, bowel sounds + Extremities: no edema, no cyanosis    The results of significant diagnostics from this hospitalization (including imaging, microbiology, ancillary and laboratory) are listed below for reference.     Microbiology: No results found for this or any previous visit (from the past 240 hour(s)).   Labs: BNP (last 3 results) Recent Labs    02/20/18 1318 05/04/18 0237  BNP 83.0 00.9   Basic Metabolic Panel: Recent Labs  Lab 09/16/18 2029 09/18/18 0531 09/19/18 0526  NA 136 138 140  K 3.9 4.0 4.3  CL 103 107 106  CO2 22 22 26   GLUCOSE 389* 221* 268*  BUN 20 31* 31*  CREATININE 1.70* 1.52* 1.48*  CALCIUM 8.8* 8.6* 8.7*   Liver Function Tests: Recent Labs  Lab 09/16/18 2029  AST 18  ALT 13  ALKPHOS 77  BILITOT 0.4  PROT 6.9  ALBUMIN 3.3*   No results for input(s): LIPASE, AMYLASE in the last 168 hours. No results for input(s): AMMONIA in the last 168 hours. CBC: Recent Labs  Lab 09/16/18 2029 09/18/18 0531 09/19/18 0526  WBC 5.8 6.7 5.6  NEUTROABS 3.4  --   --   HGB 10.1* 8.9* 9.7*  HCT 34.2* 28.3* 30.4*  MCV 83.4 79.1* 79.4*  PLT 214 178 183   Cardiac Enzymes: No results for input(s): CKTOTAL, CKMB, CKMBINDEX, TROPONINI in the last 168 hours. BNP: Invalid input(s): POCBNP CBG: Recent Labs  Lab 09/20/18 1226 09/20/18 1737 09/20/18 2109 09/21/18 0845 09/21/18 1303  GLUCAP 118* 304* 296*  167* 245*   D-Dimer No results for input(s): DDIMER in the last 72 hours. Hgb A1c No results for input(s): HGBA1C in the last 72 hours. Lipid Profile No results for input(s): CHOL, HDL, LDLCALC, TRIG, CHOLHDL, LDLDIRECT in the last 72 hours. Thyroid function studies No results for input(s): TSH, T4TOTAL, T3FREE, THYROIDAB in the last 72 hours.  Invalid input(s): FREET3 Anemia work up No results for input(s): VITAMINB12, FOLATE, FERRITIN, TIBC, IRON, RETICCTPCT in the last 72 hours. Urinalysis    Component Value Date/Time   COLORURINE YELLOW 09/16/2018 2035   APPEARANCEUR CLEAR 09/16/2018 2035   LABSPEC 1.027 09/16/2018 2035   PHURINE 5.0 09/16/2018 2035   GLUCOSEU >=500 (A) 09/16/2018 2035   HGBUR SMALL (A) 09/16/2018 2035   BILIRUBINUR NEGATIVE 09/16/2018 2035   KETONESUR NEGATIVE 09/16/2018 2035   PROTEINUR 100 (A) 09/16/2018 2035   UROBILINOGEN 0.2 08/13/2015 1030   NITRITE NEGATIVE 09/16/2018 2035   LEUKOCYTESUR NEGATIVE 09/16/2018 2035   Sepsis Labs Invalid input(s): PROCALCITONIN,  WBC,  LACTICIDVEN Microbiology No results found for this or any previous visit (from the past 240 hour(s)).   Time coordinating discharge: 45 minutes  SIGNED:   Lady Deutscher, MD  FACP [facility] 09/21/2018, 3:15 PM Pager   If 7PM-7AM, please contact night-coverage [email or web address] Password [email or web address]

## 2018-09-21 NOTE — Consult Note (Signed)
Michael [name]  09/21/2018 1:34 PM Michael [name]  MRN:  [id number] Subjective:   Michael [name] was last seen by the psychiatry consult service on 12/12 for depression and SI. He was restarted on Zoloft 50 mg daily and Trazodone 50 mg qhs PRN. On interview, he reports that he is doing better. He endorses fleeting suicidal thoughts without a plan. He denies HI or AVH. He reports an improvement in his sleep and appetite. He denies problems with his medications. He does not have an outpatient psychiatrist and will need to establish care for further medication management.   Principal Problem: MDD (major depressive disorder), recurrent episode, moderate ([name]) Diagnosis:  Principal Problem:   MDD (major depressive disorder), recurrent severe, without psychosis ([place]) Active Problems:   HIV (human immunodeficiency virus infection) ([place])   Uncontrolled type 2 diabetes with neuropathy ([place])   Cocaine use disorder, severe, dependence ([place])   Essential hypertension   S/P CABG (coronary artery bypass graft)   Chronic kidney disease, stage III (moderate) ([name])   Acute bronchitis  Total Time spent with patient: 15 minutes  Past Psychiatric History: MDD, cocaine abuse and GAD.   Past Medical History:  Past Medical History:  Diagnosis Date  . A-fib ([place])   . Asthma    No PFTs, history of childhood asthma  . CAD (coronary artery disease)    a. 06/2013 STEMI/PCI ([facility]): LAD w/ thrombus (treated with BMS), mid 75%, D2 75%; LCX OM2 75%; RCA small, PDA 95%, PLV 95%;  b. 10/2013 Cath/PCI: ISR w/in LAD (Promus DES x 2), borderline OM2 lesion;  c. 01/2014 MV: Intermediate risk, medium-sized distal ant wall infarct w/ very small amt of peri-infarct ischemia. EF 60%.  . Cellulitis 04/2014   left facial  . Chondromalacia of medial femoral condyle    Left knee MRI 04/28/12: Chondromalacia of the medial femoral condyle with slight peripheral degeneration of the meniscocapsular junction of the  medial meniscus; followed by sports medicine  . Collagen vascular disease ([place])   . Crack cocaine use    for 20+ years, has been enrolled in detox programs in the past  . Depression    with history of hospitalization for suicidal ideation  . Diabetes mellitus 2002   Diagnosed in 2002, started insulin in 2012  . Gout   . Gout 04/28/2012  . Headache(784.0)    CT head 08/2011: Periventricular and subcortical white matter hypodensities are most in keeping with chronic microangiopathic change  . HIV infection [facility]) Nov 2012   Followed by [name]  . Hyperlipidemia   . Hypertension   . Pulmonary embolism [facility])     Past Surgical History:  Procedure Laterality Date  . BACK SURGERY     1988  . BOWEL RESECTION    . CARDIAC SURGERY    . CERVICAL SPINE SURGERY     " rods in my neck "  . CORONARY ARTERY BYPASS GRAFT    . CORONARY STENT PLACEMENT    . NM MYOCAR PERF WALL MOTION  12/27/2011   normal  . SPINE SURGERY     Family History:  Family History  Problem Relation Age of Onset  . Diabetes Mother   . Hypertension Mother   . Hyperlipidemia Mother   . Diabetes Father   . Cancer Father   . Hypertension Father   . Diabetes Brother   . Heart disease Brother   . Diabetes Sister   . Colon cancer Neg Hx    Family Psychiatric  History: Denies  Social History:  Social History   Substance and Sexual Activity  Alcohol Use No  . Alcohol/week: 4.0 standard drinks  . Types: 2 Cans of beer, 2 Shots of liquor per week     Social History   Substance and Sexual Activity  Drug Use Not Currently  . Types: "Crack" cocaine, Cocaine   Comment: last use January 2018    Social History   Socioeconomic History  . Marital status: Widowed    Spouse name: Not on file  . Number of children: 2  . Years of education: 68  . Highest education level: 12th grade  Occupational History    Employer: UNEMPLOYED    Comment: 04/2016  Social Needs  . Financial resource strain: Somewhat hard  .  Food insecurity:    Worry: Sometimes true    Inability: Sometimes true  . Transportation needs:    Medical: Yes    Non-medical: Yes  Tobacco Use  . Smoking status: Never Smoker  . Smokeless tobacco: Never Used  Substance and Sexual Activity  . Alcohol use: No    Alcohol/week: 4.0 standard drinks    Types: 2 Cans of beer, 2 Shots of liquor per week  . Drug use: Not Currently    Types: "Crack" cocaine, Cocaine    Comment: last use January 2018  . Sexual activity: Yes    Comment: accepted condoms  Lifestyle  . Physical activity:    Days per week: 0 days    Minutes per session: Not on file  . Stress: Very much  Relationships  . Social connections:    Talks on phone: Once a week    Gets together: Never    Attends religious service: Never    Active member of club or organization: No    Attends meetings of clubs or organizations: Never    Relationship status: Widowed  Other Topics Concern  . Not on file  Social History Narrative   Currently staying with a friend in [place].  Was staying @ local motel until a few days ago - left b/c of bed bugs.    Sleep: Good  Appetite:  Good  Current Medications: Current Facility-Administered Medications  Medication Dose Route Frequency Provider Last Rate Last Dose  . acetaminophen (TYLENOL) tablet 650 mg  650 mg Oral Q6H PRN Lady Deutscher, MD   650 mg at 09/21/18 1040  . albuterol (PROVENTIL) (2.5 MG/3ML) 0.083% nebulizer solution 2.5 mg  2.5 mg Nebulization Q2H PRN Etta Quill, DO      . allopurinol (ZYLOPRIM) tablet 300 mg  300 mg Oral Daily Jennette Kettle M, DO   300 mg at 09/21/18 0900  . aspirin chewable tablet 81 mg  81 mg Oral Daily Jennette Kettle M, DO   81 mg at 09/21/18 0900  . bisacodyl (DULCOLAX) EC tablet 5 mg  5 mg Oral Daily PRN Lovey Newcomer T, NP   5 mg at 09/20/18 2234  . emtricitabine-rilpivir-tenofovir AF (ODEFSEY) 200-25-25 MG per tablet 1 tablet  1 tablet Oral Q breakfast Etta Quill, DO   1 tablet at  09/21/18 0900  . enoxaparin (LOVENOX) injection 40 mg  40 mg Subcutaneous Daily Jennette Kettle M, DO   40 mg at 09/21/18 0901  . feeding supplement (GLUCERNA SHAKE) (GLUCERNA SHAKE) liquid 237 mL  237 mL Oral TID BM Lady Deutscher, MD      . gabapentin (NEURONTIN) capsule 400 mg  400 mg Oral BID PRN Etta Quill, DO      .  hydrOXYzine (ATARAX/VISTARIL) tablet 25 mg  25 mg Oral TID PRN Etta Quill, DO      . insulin aspart (novoLOG) injection 7 Units  7 Units Subcutaneous TID WC Pokhrel, Laxman, MD   7 Units at 09/21/18 1329  . insulin glargine (LANTUS) injection 25 Units  25 Units Subcutaneous QHS Flora Lipps, MD   25 Units at 09/20/18 2113  . mometasone-formoterol (DULERA) 200-5 MCG/ACT inhaler 1 puff  1 puff Inhalation BID Etta Quill, DO   1 puff at 09/21/18 0904  . multivitamin with minerals tablet 1 tablet  1 tablet Oral Daily Lady Deutscher, MD      . risperiDONE (RISPERDAL) tablet 0.5 mg  0.5 mg Oral Daily Jennette Kettle M, DO   0.5 mg at 09/21/18 1040  . sertraline (ZOLOFT) tablet 50 mg  50 mg Oral Daily Pokhrel, Laxman, MD   50 mg at 09/21/18 0900  . traZODone (DESYREL) tablet 50 mg  50 mg Oral QHS Pokhrel, Laxman, MD   50 mg at 09/20/18 2106    Lab Results:  Results for orders placed or performed during the hospital encounter of 09/16/18 (from the past 48 hour(s))  Glucose, capillary     Status: Abnormal   Collection Time: 09/19/18  4:38 PM  Result Value Ref Range   Glucose-Capillary 234 (H) 70 - 99 mg/dL  Glucose, capillary     Status: Abnormal   Collection Time: 09/19/18  9:01 PM  Result Value Ref Range   Glucose-Capillary 267 (H) 70 - 99 mg/dL  Glucose, capillary     Status: None   Collection Time: 09/20/18  8:43 AM  Result Value Ref Range   Glucose-Capillary 90 70 - 99 mg/dL  Glucose, capillary     Status: Abnormal   Collection Time: 09/20/18 12:26 PM  Result Value Ref Range   Glucose-Capillary 118 (H) 70 - 99 mg/dL  Glucose, capillary      Status: Abnormal   Collection Time: 09/20/18  5:37 PM  Result Value Ref Range   Glucose-Capillary 304 (H) 70 - 99 mg/dL  Glucose, capillary     Status: Abnormal   Collection Time: 09/20/18  9:09 PM  Result Value Ref Range   Glucose-Capillary 296 (H) 70 - 99 mg/dL  Glucose, capillary     Status: Abnormal   Collection Time: 09/21/18  8:45 AM  Result Value Ref Range   Glucose-Capillary 167 (H) 70 - 99 mg/dL  Glucose, capillary     Status: Abnormal   Collection Time: 09/21/18  1:03 PM  Result Value Ref Range   Glucose-Capillary 245 (H) 70 - 99 mg/dL    Blood Alcohol level:  Lab Results  Component Value Date   ETH <10 09/02/2018   ETH <10 08/26/2018   Musculoskeletal: Strength & Muscle Tone: within normal limits Gait & Station: UTA since patient is sitting in a chair. Patient leans: N/A  Psychiatric Specialty Exam: Physical Exam  Nursing [name] and vitals reviewed. Constitutional: He is oriented to person, place, and time. He appears well-developed and well-nourished.  HENT:  Head: Normocephalic and atraumatic.  Neck: Normal range of motion.  Respiratory: Effort normal.  Musculoskeletal: Normal range of motion.  Neurological: He is alert and oriented to person, place, and time.  Psychiatric: His speech is normal and behavior is normal. Judgment and thought content normal. Cognition and memory are normal. He exhibits a depressed mood.    Review of Systems  Cardiovascular: Positive for chest pain.  Gastrointestinal: Positive for constipation. Negative  for abdominal pain, diarrhea, nausea and vomiting.  Psychiatric/Behavioral: Positive for depression and suicidal ideas (No plan or intention to harm self.). Negative for hallucinations.  All other systems reviewed and are negative.   Blood pressure 123/78, pulse 60, temperature 98.4 F (36.9 C), temperature source Oral, resp. rate 18, height 5\' 8"  (1.727 m), weight 74.4 kg, SpO2 100 %.Body mass index is 24.94 kg/m.  General  Appearance: Fairly Groomed, elderly, African American male, wearing a hospital gown who is sitting in a chair. NAD.   Eye Contact:  Good  Speech:  Clear and Coherent and Normal Rate  Volume:  Normal  Mood:  Depressed  Affect:  Constricted  Thought Process:  Goal Directed, Linear and Descriptions of Associations: Intact  Orientation:  Full (Time, Place, and Person)  Thought Content:  Logical  Suicidal Thoughts:  Yes.  without intent/plan  Homicidal Thoughts:  No  Memory:  Immediate;   Good Recent;   Good Remote;   Good  Judgement:  Fair  Insight:  Fair  Psychomotor Activity:  Normal  Concentration:  Concentration: Good and Attention Span: Good  Recall:  Good  Fund of Knowledge:  Good  Language:  Good  Akathisia:  No  Handed:  Right  AIMS (if indicated):   N/A  Assets:  Communication Skills Desire for Improvement Resilience Social Support  ADL's:  Intact  Cognition:  WNL  Sleep:   Okay    Assessment:  Michael [name] is a 67 y.o. male who was admitted with acute bronchitis. Psychiatry was consulted on 12/12 for SI with plan to harm self. He was restarted on his medications and reports an improvement in how he is doing today. He endorsees fleeting SI without a plan or intention to harm self. He denies HI or AVH. He no longer warrants inpatient psychiatric hospitalization. He is agreeable to follow up with an outpatient psychiatrist.   Treatment Plan Summary: -Continue Zoloft 50 mg daily for mood, Trazodone 50 mg qhs PRN for insomnia and Risperdal 0.5 mg daily for mood stabilization/possible psychosis.  -EKG reviewed and QTc 413 on 12/11. Please closely monitor when starting or increasing QTc prolonging agents.  -Please have SW provide patient with outpatient mental health resources.  -Will sign off on patient at this time. Please consult psychiatry again as needed.   Faythe Dingwall, DO 09/21/2018, 1:34 PM

## 2018-09-21 NOTE — Progress Notes (Signed)
Physical Therapy Treatment Patient Details Name: Michael [name] MRN: [id number] DOB: 06/25/1951 Today's Date: 09/21/2018    History of Present Illness Michael [name] is a 67 y.o. male with medical history significant of CAD S/P CABG, preserved EF as of July echo, HIV on HAART, ongoing cocaine abuse, HTN, DM2. Presents to ED for SOB    PT Comments    Patient seen for mobility progression. Pt tolerated session well and able to ambulate 500 ft without UE support and supervision for safety. Pt does demonstrate gait deviations however pt feels that he is ambulating very close to baseline. Recommend pt continue using SPC for increased stability. Pt reports that he lost his cane prior to admission. Continue to progress as tolerated.    Follow Up Recommendations  No PT follow up     Equipment Recommendations  Cane    Recommendations for Other Services       Precautions / Restrictions Precautions Precautions: Fall Precaution Comments: pt reports history of falls Restrictions Weight Bearing Restrictions: No    Mobility  Bed Mobility Overal bed mobility: Modified Independent                Transfers Overall transfer level: Modified independent Equipment used: 1 person hand held assist Transfers: Sit to/from Stand Sit to Stand: Min guard         General transfer comment: for safety and immediate standing balance  Ambulation/Gait Ambulation/Gait assistance: Supervision Gait Distance (Feet): 500 Feet Assistive device: None Gait Pattern/deviations: Step-through pattern;Decreased stride length Gait velocity: decreased - reports this is baseline   General Gait Details: pt reports gait is close to, if not, baseline; pt does demonstrate gait deviations and unable to change gait speed; pt is guarded with movements in part due to prior cervical spine surgeries per pt and limited ROM   Stairs             Wheelchair Mobility    Modified Rankin (Stroke  Patients Only)       Balance Overall balance assessment: History of Falls                                          Cognition Arousal/Alertness: Awake/alert Behavior During Therapy: WFL for tasks assessed/performed Overall Cognitive Status: Within Functional Limits for tasks assessed                                        Exercises      General Comments        Pertinent Vitals/Pain Pain Assessment: Faces Pain Score: 0-No pain Faces Pain Scale: No hurt Pain Location: chest/ribs Pain Intervention(s): Monitored during session    Home Living                      Prior Function            PT Goals (current goals can now be found in the care plan section) Acute Rehab PT Goals Patient Stated Goal: get better Progress towards PT goals: Progressing toward goals    Frequency    Min 2X/week      PT Plan Current plan remains appropriate    Co-evaluation              AM-PAC PT "6 Clicks" Mobility  Outcome Measure  Help needed turning from your back to your side while in a flat bed without using bedrails?: None Help needed moving from lying on your back to sitting on the side of a flat bed without using bedrails?: None Help needed moving to and from a bed to a chair (including a wheelchair)?: None Help needed standing up from a chair using your arms (e.g., wheelchair or bedside chair)?: A Little Help needed to walk in hospital room?: A Little Help needed climbing 3-5 steps with a railing? : A Little 6 Click Score: 21    End of Session Equipment Utilized During Treatment: Gait belt Activity Tolerance: Patient tolerated treatment well Patient left: with call bell/phone within reach;in chair;with nursing/sitter in room Nurse Communication: Mobility status PT Visit Diagnosis: Unsteadiness on feet (R26.81);Muscle weakness (generalized) (M62.81)     Time: 9741-6384 PT Time Calculation (min) (ACUTE ONLY): 17  min  Charges:  $Gait Training: 8-22 mins                     Earney Navy, PTA Acute Rehabilitation Services Pager: [phone] Office: [phone]     Darliss Cheney 09/21/2018, 4:20 PM

## 2018-09-22 DIAGNOSIS — F331 Major depressive disorder, recurrent, moderate: Secondary | ICD-10-CM | POA: Diagnosis not present

## 2018-09-22 LAB — GLUCOSE, CAPILLARY
GLUCOSE-CAPILLARY: 203 mg/dL — AB (ref 70–99)
GLUCOSE-CAPILLARY: 190 mg/dL — AB (ref 70–99)

## 2018-09-22 NOTE — Progress Notes (Signed)
PROGRESS NOTE    Michael [name]  QPR:[id number] DOB: 08/02/1951 DOA: 09/16/2018 PCP: [name], PA-C   Brief Narrative:  Patient is a 67 years old male with past medical history of coronary artery disease status post CABG, HIV on [place] medications, ongoing cocaine abuse, hypertension, diabetes mellitus type 2 presented to the hospital with shortness of breath, cough, wheezing and some chest discomfort. Patient did have some albuterol at home which have provided some relief but continued to have symptoms so he presented to the hospital. He was admitted to hospital for acute bronchitis asthma exacerbation and suicidal ideation on the background of substance abuse and bipolar disorder. Patient was placed on one-to-one sitter. Patient was seen by psychiatry and recommended inpatient psychiatric facility transfer after medical stabilization.  Patient was reevaluated today by psychiatry and deemed stable for discharge and no longer with a suicidal plan.  When social worker went in there with a voucher for a taxicab he then expressed a suicide ideation with a plan to throw himself in front of a car.  Discharge was discontinued and psychiatry was reconsulted.  At this point we are awaiting further information from psychiatry.  Assessment & Plan:   Principal Problem:   MDD (major depressive disorder), recurrent episode, moderate ([name]) Active Problems:   HIV (human immunodeficiency virus infection) ([place])   Uncontrolled type 2 diabetes with neuropathy ([name])   Cocaine use disorder, severe, dependence ([place])   MDD (major depressive disorder), recurrent severe, without psychosis ([place])   Essential hypertension   S/P CABG (coronary artery bypass graft)   Chronic kidney disease, stage III (moderate) ([name])   Malnutrition of moderate degree   History of manic depressive disorder recurrent, cocaine abuse, generalized anxiety disorder. Now with suicidal ideation. Patient was seen by  psychiatry Dr. Mariea Clonts. Patient was initiated on trazodone and Zoloft as per psychiatry recommendation. Patient will be continued on risperidone. Psychiatry recommended inpatient psychiatric hospitalization asthe patient is stable. Spoke with social worker about it- regarding inpatient psychiatric admission after medical stabilization. Continue suicide precautions. Medically Stable for discharge.  Awaiting further psychiatry instructions  Diabetes mellitus type 2, uncontrolled. Continue on Accu-Chek sliding scale, Lantus insulin, diabetic diet. Will closely monitor. Closely monitor on prednisone.  History of hypertension, coronary artery disease CABG. No acute issues. We will continue current medications.  Peripheral neuropathy. Continue Neurontin.  HIV. Continue on Odefsey  VTE Prophylaxis: Lovenox  Code Status:Full code  Family Communication:No one present at bedside.  Disposition Plan:To inpatient psychiatric facility by tomorrow if bed is available. Patient is improving medically and is stable for discharge to a psychiatry facility when bed is available. patient will need inhalers for asthma on discharge. I have informed the social worker regarding disposition./  Consultants:  Psychiatry  Procedures:  None  Antibiotics:  None   Subjective: aWake alert and oriented.  Walking in halls.  No new complaints.  Awaiting psychiatry consultation.  Objective: Vitals:   09/21/18 2112 09/22/18 0559 09/22/18 0739 09/22/18 1316  BP: 134/83 (!) 151/93  112/78  Pulse: 85 78  91  Resp: 16 18  20   Temp: 98 F (36.7 C) 97.9 F (36.6 C)  97.8 F (36.6 C)  TempSrc: Oral Oral  Oral  SpO2: 99% 100% 99% 99%  Weight:      Height:        Intake/Output Summary (Last 24 hours) at 09/22/2018 1413 Last data filed at 09/22/2018 1312 Gross per 24 hour  Intake 612 ml  Output -  Net  612 ml   Filed Weights   09/16/18 2021 09/16/18 2310  Weight: 74.4 kg 74.4  kg    Examination:  General exam: Appears calm and comfortable  Respiratory system: Clear to auscultation. Respiratory effort normal. Cardiovascular system: S1 & S2 heard, RRR. No JVD, murmurs, rubs, gallops or clicks. No pedal edema. Gastrointestinal system: Abdomen is nondistended, soft and nontender. No organomegaly or masses felt. Normal bowel sounds heard. Central nervous system: Alert and oriented. No focal neurological deficits. Extremities: Symmetric 5 x 5 power. Skin: No rashes, lesions or ulcers Psychiatry: Judgement and insight appear normal. Mood & affect appropriate.     Data Reviewed: I have personally reviewed following labs and imaging studies  CBC: Recent Labs  Lab 09/16/18 2029 09/18/18 0531 09/19/18 0526  WBC 5.8 6.7 5.6  NEUTROABS 3.4  --   --   HGB 10.1* 8.9* 9.7*  HCT 34.2* 28.3* 30.4*  MCV 83.4 79.1* 79.4*  PLT 214 178 706   Basic Metabolic Panel: Recent Labs  Lab 09/16/18 2029 09/18/18 0531 09/19/18 0526  NA 136 138 140  K 3.9 4.0 4.3  CL 103 107 106  CO2 22 22 26   GLUCOSE 389* 221* 268*  BUN 20 31* 31*  CREATININE 1.70* 1.52* 1.48*  CALCIUM 8.8* 8.6* 8.7*   GFR: Estimated Creatinine Clearance: 46.2 mL/min (A) (by C-G formula based on SCr of 1.48 mg/dL (H)). Liver Function Tests: Recent Labs  Lab 09/16/18 2029  AST 18  ALT 13  ALKPHOS 77  BILITOT 0.4  PROT 6.9  ALBUMIN 3.3*   No results for input(s): LIPASE, AMYLASE in the last 168 hours. No results for input(s): AMMONIA in the last 168 hours. Coagulation Profile: No results for input(s): INR, PROTIME in the last 168 hours. Cardiac Enzymes: No results for input(s): CKTOTAL, CKMB, CKMBINDEX, TROPONINI in the last 168 hours. BNP (last 3 results) No results for input(s): PROBNP in the last 8760 hours. HbA1C: No results for input(s): HGBA1C in the last 72 hours. CBG: Recent Labs  Lab 09/21/18 1303 09/21/18 1730 09/21/18 2153 09/22/18 1003 09/22/18 1154  GLUCAP 245* 350*  330* 203* 190*    Radiology Studies: No results found.  Scheduled Meds: . allopurinol  300 mg Oral Daily  . aspirin  81 mg Oral Daily  . emtricitabine-rilpivir-tenofovir AF  1 tablet Oral Q breakfast  . enoxaparin (LOVENOX) injection  40 mg Subcutaneous Daily  . feeding supplement (GLUCERNA SHAKE)  237 mL Oral TID BM  . insulin aspart  7 Units Subcutaneous TID WC  . insulin glargine  25 Units Subcutaneous QHS  . mometasone-formoterol  1 puff Inhalation BID  . multivitamin with minerals  1 tablet Oral Daily  . risperiDONE  0.5 mg Oral Daily  . sertraline  50 mg Oral Daily  . traZODone  50 mg Oral QHS   Continuous Infusions:   LOS: 0 days    Time spent: 25 minutes    Lady Deutscher, MD FACP [facility] Pager [phone]  If 7PM-7AM, please contact night-coverage [email or web address] Password [email or web address] 09/22/2018, 2:13 PM

## 2018-09-22 NOTE — Plan of Care (Signed)
  Problem: Education: Goal: Knowledge of General Education information will improve Description Including pain rating scale, medication(s)/side effects and non-pharmacologic comfort measures Outcome: Progressing   Problem: Clinical Measurements: Goal: Ability to maintain clinical measurements within normal limits will improve Outcome: Progressing Goal: Will remain free from infection Outcome: Progressing Goal: Respiratory complications will improve Outcome: Progressing   Problem: Activity: Goal: Risk for activity intolerance will decrease Outcome: Progressing   Problem: Coping: Goal: Level of anxiety will decrease Outcome: Progressing   Problem: Elimination: Goal: Will not experience complications related to bowel motility Outcome: Progressing Goal: Will not experience complications related to urinary retention Outcome: Progressing   Problem: Safety: Goal: Ability to remain free from injury will improve Outcome: Progressing   Problem: Skin Integrity: Goal: Risk for impaired skin integrity will decrease Outcome: Progressing

## 2018-09-22 NOTE — Social Work (Signed)
CSW provided pt with outpatient resources, bus passes, and shelter information. Pt states understanding, he is requesting copy of his AVS to take to [facility] with him.  Westley Hummer, MSW, [place] Work [phone]

## 2018-09-22 NOTE — Discharge Summary (Signed)
Physician Discharge Summary  Michael [name] EHM:[id number] DOB: 11/27/1950 DOA: 09/16/2018  PCP: [name], PA-C  Admit date: 09/16/2018 Discharge date: 09/22/2018  Admitted From: Home Disposition: Home  Recommendations for Outpatient Follow-up:  1. Follow up with PCP in 1-2 weeks 2. He can keep follow-up appointment with psychiatry as directed 3. Please obtain BMP/CBC in one week 4. Please follow up on the following pending results:  Home Health: No Equipment/Devices: None  Discharge Condition: Stable CODE STATUS: Full code Diet recommendation: Heart healthy carb modified  Brief/Interim Summary:  Patient is a 67 years old male with past medical history of coronary artery disease status post CABG, HIV on [place] medications, ongoing cocaine abuse, hypertension, diabetes mellitus type 2 presented to the hospital with shortness of breath, cough, wheezing and some chest discomfort. Patient did have some albuterol at home which have provided some relief but continued to have symptoms so he presented to the hospital. He was admitted to hospital for acute bronchitis asthma exacerbation and suicidal ideation on the background of substance abuse and bipolar disorder. Patient was placed on one-to-one sitter. Patient was seen by psychiatry and recommended inpatient psychiatric facility transfer after medical stabilization.  Patient was reevaluated today by psychiatry and deemed stable for discharge and no longer with a suicidal plan.  He does have fleeting suicidal ideation but he was cleared by them to discharge home and therefore he is stable for discharge home with outpatient follow-up.  Patient has reached maximal benefit of hospitalization.  Discharge diagnosis, prognosis, plans, follow-up, medications and treatments discussed with the patient(or responsible party) and is in agreement with the plans as described.  Patient is stable for discharge.  Was discharged yesterday  but then when social worker went in to tell him it was time to go home he became suicidal again and said he was going through himself in front of a car.  At this point psychiatry feels like that is perhaps not exactly the case.  And that he is stable to go home.  He does want to stay for dinner.  She is arranging for him to go to someplace in [place] for residential treatment.   Discharge Diagnoses:  Principal Problem:   MDD (major depressive disorder), recurrent episode, moderate ([name]) Active Problems:   HIV (human immunodeficiency virus infection) ([place])   Uncontrolled type 2 diabetes with neuropathy ([name])   Cocaine use disorder, severe, dependence ([place])   MDD (major depressive disorder), recurrent severe, without psychosis ([place])   Essential hypertension   S/P CABG (coronary artery bypass graft)   Chronic kidney disease, stage III (moderate) ([place])   Malnutrition of moderate degree    Discharge Instructions  Discharge Instructions    Call MD for:  temperature >100.4   Complete by:  As directed    Diet - low sodium heart healthy   Complete by:  As directed    Diet - low sodium heart healthy   Complete by:  As directed    Discharge instructions   Complete by:  As directed    Make and keep follow-up appointment with psychiatry.   Discharge instructions   Complete by:  As directed    Keep follow-up appointment as described by psychiatry.   Increase activity slowly   Complete by:  As directed    Increase activity slowly   Complete by:  As directed      Allergies as of 09/22/2018   No Known Allergies     Medication List  TAKE these medications   ACCU-CHEK SOFTCLIX LANCETS lancets USE TO CHECK BLOOD GLUCOSE LEVELS UP TO 4 TIMES DAILY AS DIRECTED   albuterol 108 (90 Base) MCG/ACT inhaler Commonly known as:  PROAIR HFA INHALE TWO PUFFS INTO THE LUNGS EVERY 6 (SIX) HOURS AS NEEDED FOR WHEEZING OR SHORTNESS OF BREATH. What changed:    how much to take  how to take  this  when to take this  reasons to take this  additional instructions   allopurinol 300 MG tablet Commonly known as:  ZYLOPRIM Take 1 tablet (300 mg total) by mouth daily. For gout.   aspirin 81 MG chewable tablet Chew 81 mg by mouth daily.   emtricitabine-rilpivir-tenofovir AF 200-25-25 MG Tabs tablet Commonly known as:  ODEFSEY Take 1 tablet by mouth daily with breakfast. For HIV infection   gabapentin 400 MG capsule Commonly known as:  NEURONTIN Take 1 capsule (400 mg total) by mouth 2 (two) times daily. For agitation/neuropathic pain What changed:    when to take this  reasons to take this  additional instructions   hydrOXYzine 25 MG tablet Commonly known as:  ATARAX/VISTARIL Take 1 tablet (25 mg total) by mouth 3 (three) times daily as needed for anxiety.   insulin glargine 100 UNIT/ML injection Commonly known as:  LANTUS Inject 0.3 mLs (30 Units total) into the skin at bedtime. For diabetes management   insulin lispro 100 UNIT/ML injection Commonly known as:  HUMALOG Inject 0.05 mLs (5 Units total) into the skin 3 (three) times daily with meals. For diabetes management   Insulin Syringe-Needle U-100 27G X 5/8" 1 ML Misc Commonly known as:  B-D INS SYR MICROFINE 1CC/27G 1 strip by Does not apply route 3 (three) times daily as needed. For blood sugar checks   metFORMIN 500 MG tablet Commonly known as:  GLUCOPHAGE Take 1 tablet (500 mg total) by mouth 2 (two) times daily with a meal.   risperiDONE 0.5 MG tablet Commonly known as:  RISPERDAL Take 0.5 mg by mouth daily.   sertraline 50 MG tablet Commonly known as:  ZOLOFT Take 1 tablet (50 mg total) by mouth daily.   traZODone 50 MG tablet Commonly known as:  DESYREL Take 1 tablet (50 mg total) by mouth at bedtime.      Follow-up Information    [name], PA-C. Schedule an appointment as soon as possible for a visit in 1 week(s).   Specialty:  Physician Assistant Contact information: [place] [address] [phone]        [name], MD. Schedule an appointment as soon as possible for a visit in 1 month(s).   Specialty:  Infectious Diseases Contact information: [place] [address] [phone]          No Known Allergies  Consultations:  Dr. Mariea Clonts from psychiatry   Procedures/Studies: Dg Chest 2 View  Result Date: 09/16/2018 CLINICAL DATA:  Mildly productive cough. Shortness of breath. EXAM: CHEST - 2 VIEW COMPARISON:  06/08/2018 and chest CT dated 09/02/2018. FINDINGS: Normal sized heart. Tortuous aorta. Stable left atrial appendage clip and coronary artery stent. Post CABG changes are again demonstrated. Stable small amount of linear scarring in the lingula or right middle lobe and small amount of linear scarring in the right perihilar region. Cervical spine fixation hardware. IMPRESSION: No acute abnormality. Electronically Signed   By: Claudie Revering M.D.   On: 09/16/2018 21:21   Ct Head Wo Contrast  Result Date: 09/02/2018 CLINICAL DATA:  Pain following fall EXAM: CT HEAD WITHOUT CONTRAST CT CERVICAL SPINE WITHOUT CONTRAST TECHNIQUE: Multidetector CT imaging of the head and cervical spine was performed following the standard protocol without intravenous contrast. Multiplanar CT image reconstructions of the cervical spine were also generated. COMPARISON:  Head CT and cervical spine CT December 16, 2016 FINDINGS: CT HEAD FINDINGS Brain: Mild diffuse atrophy is stable. There is a 7 x 6 mm lipoma in the midline at the level of the third ventricle, stable. No other mass is seen. There is no hemorrhage, extra-axial fluid collection, or midline shift. There is patchy small vessel disease in the centra semiovale bilaterally. No acute infarct is demonstrable on this study. Vascular: There is no appreciable hyperdense vessel. There is mild calcification in the left carotid siphon region. Skull: The bony calvarium  appears intact. Sinuses/Orbits: There is mucosal thickening involving several ethmoid air cells. Orbits appear symmetric bilaterally. Other: Mastoid air cells are clear. CT CERVICAL SPINE FINDINGS Alignment: There is no appreciable spondylolisthesis. Skull base and vertebrae: Skull base and craniocervical junction regions appear normal. The patient is status post anterior screw and plate fixation from X9-K2 with support hardware intact. No fracture evident. No blastic or lytic bone lesions. Soft tissues and spinal canal: Prevertebral soft tissues and predental space regions are normal. No paraspinous lesions are evident. There is no evident cord or canal hematoma. Disc levels: There is ankylosis at C3-4, C4-5, C5-6, and C6-7. There is moderate disc space narrowing at C7-T1. There is facet hypertrophy at multiple levels. There is no frank disc extrusion or stenosis on this study. Upper chest: The visualized upper lung zones are clear. Other: There are foci of carotid artery calcification bilaterally. IMPRESSION: CT head: Stable atrophy with periventricular small vessel disease. No acute infarct. Stable subcentimeter lipoma at the level of the third ventricle in the midline, not causing hydrocephalus. No other mass. No hemorrhage. Mild arterial vascular calcification noted. There is mucosal thickening involving several ethmoid air cells. CT cervical spine: No fracture or spondylolisthesis. Postoperative change from C3-C7 with support hardware intact. Multilevel arthropathy noted without disc extrusion or stenosis evident. Foci of carotid artery calcification noted bilaterally. Electronically Signed   By: Lowella Grip III M.D.   On: 09/02/2018 08:31   Ct Chest W Contrast  Result Date: 09/02/2018 CLINICAL DATA:  Moderate blunt trauma. EXAM: CT CHEST, ABDOMEN, AND PELVIS WITH CONTRAST TECHNIQUE: Multidetector CT imaging of the chest, abdomen and pelvis was performed following the standard protocol during bolus  administration of intravenous contrast. CONTRAST:  153mL ISOVUE-300 IOPAMIDOL (ISOVUE-300) INJECTION 61% COMPARISON:  CT scan of Feb 22, 2018. FINDINGS: CT CHEST FINDINGS Cardiovascular: No evidence of thoracic aortic dissection or aneurysm. Status post coronary artery bypass graft. Normal cardiac size. No pericardial effusion is noted. Mediastinum/Nodes: No enlarged mediastinal, hilar, or axillary lymph nodes. Thyroid gland, trachea, and esophagus demonstrate no significant findings. Lungs/Pleura: No pneumothorax or pleural effusion is noted. Mild right posterior basilar subsegmental atelectasis or infiltrate is noted. Left lung is clear. Musculoskeletal: No chest wall mass or suspicious bone lesions identified. CT ABDOMEN PELVIS FINDINGS Hepatobiliary: No focal liver abnormality is seen. No gallstones, gallbladder wall thickening, or biliary dilatation. Pancreas: Unremarkable. No pancreatic ductal dilatation or surrounding inflammatory changes. Spleen: Normal in size without focal abnormality. Adrenals/Urinary Tract: Adrenal glands are unremarkable. Kidneys are normal, without renal calculi, focal lesion, or hydronephrosis. Bladder is unremarkable. Stomach/Bowel: Stomach is within normal limits. Appendix appears normal. No evidence of bowel wall thickening,  distention, or inflammatory changes. Vascular/Lymphatic: No significant vascular findings are present. No enlarged abdominal or pelvic lymph nodes. Reproductive: Prostate is unremarkable. Other: No abdominal wall hernia or abnormality. No abdominopelvic ascites. Musculoskeletal: No acute or significant osseous findings. IMPRESSION: No evidence of traumatic injury seen in the chest, abdomen or pelvis. Mild right posterior basilar subsegmental atelectasis or infiltrate is noted. No other abnormality is noted. Electronically Signed   By: Marijo Conception, M.D.   On: 09/02/2018 08:47   Ct Cervical Spine Wo Contrast  Result Date: 09/02/2018 CLINICAL DATA:  Pain  following fall EXAM: CT HEAD WITHOUT CONTRAST CT CERVICAL SPINE WITHOUT CONTRAST TECHNIQUE: Multidetector CT imaging of the head and cervical spine was performed following the standard protocol without intravenous contrast. Multiplanar CT image reconstructions of the cervical spine were also generated. COMPARISON:  Head CT and cervical spine CT December 16, 2016 FINDINGS: CT HEAD FINDINGS Brain: Mild diffuse atrophy is stable. There is a 7 x 6 mm lipoma in the midline at the level of the third ventricle, stable. No other mass is seen. There is no hemorrhage, extra-axial fluid collection, or midline shift. There is patchy small vessel disease in the centra semiovale bilaterally. No acute infarct is demonstrable on this study. Vascular: There is no appreciable hyperdense vessel. There is mild calcification in the left carotid siphon region. Skull: The bony calvarium appears intact. Sinuses/Orbits: There is mucosal thickening involving several ethmoid air cells. Orbits appear symmetric bilaterally. Other: Mastoid air cells are clear. CT CERVICAL SPINE FINDINGS Alignment: There is no appreciable spondylolisthesis. Skull base and vertebrae: Skull base and craniocervical junction regions appear normal. The patient is status post anterior screw and plate fixation from G3-O7 with support hardware intact. No fracture evident. No blastic or lytic bone lesions. Soft tissues and spinal canal: Prevertebral soft tissues and predental space regions are normal. No paraspinous lesions are evident. There is no evident cord or canal hematoma. Disc levels: There is ankylosis at C3-4, C4-5, C5-6, and C6-7. There is moderate disc space narrowing at C7-T1. There is facet hypertrophy at multiple levels. There is no frank disc extrusion or stenosis on this study. Upper chest: The visualized upper lung zones are clear. Other: There are foci of carotid artery calcification bilaterally. IMPRESSION: CT head: Stable atrophy with periventricular  small vessel disease. No acute infarct. Stable subcentimeter lipoma at the level of the third ventricle in the midline, not causing hydrocephalus. No other mass. No hemorrhage. Mild arterial vascular calcification noted. There is mucosal thickening involving several ethmoid air cells. CT cervical spine: No fracture or spondylolisthesis. Postoperative change from C3-C7 with support hardware intact. Multilevel arthropathy noted without disc extrusion or stenosis evident. Foci of carotid artery calcification noted bilaterally. Electronically Signed   By: Lowella Grip III M.D.   On: 09/02/2018 08:31   Ct Abdomen Pelvis W Contrast  Result Date: 09/02/2018 CLINICAL DATA:  Moderate blunt trauma. EXAM: CT CHEST, ABDOMEN, AND PELVIS WITH CONTRAST TECHNIQUE: Multidetector CT imaging of the chest, abdomen and pelvis was performed following the standard protocol during bolus administration of intravenous contrast. CONTRAST:  134mL ISOVUE-300 IOPAMIDOL (ISOVUE-300) INJECTION 61% COMPARISON:  CT scan of Feb 22, 2018. FINDINGS: CT CHEST FINDINGS Cardiovascular: No evidence of thoracic aortic dissection or aneurysm. Status post coronary artery bypass graft. Normal cardiac size. No pericardial effusion is noted. Mediastinum/Nodes: No enlarged mediastinal, hilar, or axillary lymph nodes. Thyroid gland, trachea, and esophagus demonstrate no significant findings. Lungs/Pleura: No pneumothorax or pleural effusion is noted. Mild right posterior  basilar subsegmental atelectasis or infiltrate is noted. Left lung is clear. Musculoskeletal: No chest wall mass or suspicious bone lesions identified. CT ABDOMEN PELVIS FINDINGS Hepatobiliary: No focal liver abnormality is seen. No gallstones, gallbladder wall thickening, or biliary dilatation. Pancreas: Unremarkable. No pancreatic ductal dilatation or surrounding inflammatory changes. Spleen: Normal in size without focal abnormality. Adrenals/Urinary Tract: Adrenal glands are  unremarkable. Kidneys are normal, without renal calculi, focal lesion, or hydronephrosis. Bladder is unremarkable. Stomach/Bowel: Stomach is within normal limits. Appendix appears normal. No evidence of bowel wall thickening, distention, or inflammatory changes. Vascular/Lymphatic: No significant vascular findings are present. No enlarged abdominal or pelvic lymph nodes. Reproductive: Prostate is unremarkable. Other: No abdominal wall hernia or abnormality. No abdominopelvic ascites. Musculoskeletal: No acute or significant osseous findings. IMPRESSION: No evidence of traumatic injury seen in the chest, abdomen or pelvis. Mild right posterior basilar subsegmental atelectasis or infiltrate is noted. No other abnormality is noted. Electronically Signed   By: Marijo Conception, M.D.   On: 09/02/2018 08:47       Subjective: No new complaints.  Discharge Exam: Vitals:   09/22/18 0739 09/22/18 1316  BP:  112/78  Pulse:  91  Resp:  20  Temp:  97.8 F (36.6 C)  SpO2: 99% 99%   Vitals:   09/21/18 2112 09/22/18 0559 09/22/18 0739 09/22/18 1316  BP: 134/83 (!) 151/93  112/78  Pulse: 85 78  91  Resp: 16 18  20   Temp: 98 F (36.7 C) 97.9 F (36.6 C)  97.8 F (36.6 C)  TempSrc: Oral Oral  Oral  SpO2: 99% 100% 99% 99%  Weight:      Height:        General: Pt is alert, awake, not in acute distress Cardiovascular: RRR, S1/S2 +, no rubs, no gallops Respiratory: CTA bilaterally, no wheezing, no rhonchi Abdominal: Soft, NT, ND, bowel sounds + Extremities: no edema, no cyanosis    The results of significant diagnostics from this hospitalization (including imaging, microbiology, ancillary and laboratory) are listed below for reference.     Microbiology: No results found for this or any previous visit (from the past 240 hour(s)).   Labs: BNP (last 3 results) Recent Labs    02/20/18 1318 05/04/18 0237  BNP 83.0 99.2   Basic Metabolic Panel: Recent Labs  Lab 09/16/18 2029  09/18/18 0531 09/19/18 0526  NA 136 138 140  K 3.9 4.0 4.3  CL 103 107 106  CO2 22 22 26   GLUCOSE 389* 221* 268*  BUN 20 31* 31*  CREATININE 1.70* 1.52* 1.48*  CALCIUM 8.8* 8.6* 8.7*   Liver Function Tests: Recent Labs  Lab 09/16/18 2029  AST 18  ALT 13  ALKPHOS 77  BILITOT 0.4  PROT 6.9  ALBUMIN 3.3*   No results for input(s): LIPASE, AMYLASE in the last 168 hours. No results for input(s): AMMONIA in the last 168 hours. CBC: Recent Labs  Lab 09/16/18 2029 09/18/18 0531 09/19/18 0526  WBC 5.8 6.7 5.6  NEUTROABS 3.4  --   --   HGB 10.1* 8.9* 9.7*  HCT 34.2* 28.3* 30.4*  MCV 83.4 79.1* 79.4*  PLT 214 178 183   Cardiac Enzymes: No results for input(s): CKTOTAL, CKMB, CKMBINDEX, TROPONINI in the last 168 hours. BNP: Invalid input(s): POCBNP CBG: Recent Labs  Lab 09/21/18 1303 09/21/18 1730 09/21/18 2153 09/22/18 1003 09/22/18 1154  GLUCAP 245* 350* 330* 203* 190*   D-Dimer No results for input(s): DDIMER in the last 72 hours. Hgb A1c No  results for input(s): HGBA1C in the last 72 hours. Lipid Profile No results for input(s): CHOL, HDL, LDLCALC, TRIG, CHOLHDL, LDLDIRECT in the last 72 hours. Thyroid function studies No results for input(s): TSH, T4TOTAL, T3FREE, THYROIDAB in the last 72 hours.  Invalid input(s): FREET3 Anemia work up No results for input(s): VITAMINB12, FOLATE, FERRITIN, TIBC, IRON, RETICCTPCT in the last 72 hours. Urinalysis    Component Value Date/Time   COLORURINE YELLOW 09/16/2018 2035   APPEARANCEUR CLEAR 09/16/2018 2035   LABSPEC 1.027 09/16/2018 2035   PHURINE 5.0 09/16/2018 2035   GLUCOSEU >=500 (A) 09/16/2018 2035   HGBUR SMALL (A) 09/16/2018 2035   BILIRUBINUR NEGATIVE 09/16/2018 2035   KETONESUR NEGATIVE 09/16/2018 2035   PROTEINUR 100 (A) 09/16/2018 2035   UROBILINOGEN 0.2 08/13/2015 1030   NITRITE NEGATIVE 09/16/2018 2035   LEUKOCYTESUR NEGATIVE 09/16/2018 2035   Sepsis Labs Invalid input(s): PROCALCITONIN,   WBC,  LACTICIDVEN Microbiology No results found for this or any previous visit (from the past 240 hour(s)).   Time coordinating discharge: 37 minutes  SIGNED:   Lady Deutscher, MD  FACP [facility] 09/22/2018, 3:42 PM Pager   If 7PM-7AM, please contact night-coverage [email or web address] Password [email or web address]

## 2018-09-22 NOTE — Progress Notes (Signed)
D/C instructions given to patient. Medication reviewed. Resources for mental health organizations given. All questions answered. Pt with bus passes in hand.   Clyde Canterbury, RN

## 2018-09-22 NOTE — Consult Note (Signed)
[facility] Psych Consult Progress Note  09/22/2018 12:50 PM Michael [name]  MRN:  [id number] Subjective:   Michael [name] was last seen by the psychiatry consult service on 12/16. He was psychiatrically cleared. He was agreeable to follow up with an outpatient psychiatrist. He endorsed SI with a plan when speaking to SW prior to discharge so psychiatry was reconsulted due to concern for safety.   On interview, Michael [name] initially reports "I am not ready to go outside because I cannot trust myself." He later reports that his main concern is housing. He is interested in receiving information about the [employer]. He denies other concerns and reports that he is feeling better and his medical problems have also significantly improved. He is able to safety plan and is aware to return to the hospital if he has thoughts to harm self and feels unsafe because he may act on them.   Principal Problem: MDD (major depressive disorder), recurrent episode, moderate ([name]) Diagnosis:  Principal Problem:   MDD (major depressive disorder), recurrent episode, moderate ([name]) Active Problems:   HIV (human immunodeficiency virus infection) ([place])   Uncontrolled type 2 diabetes with neuropathy ([name])   Cocaine use disorder, severe, dependence ([place])   MDD (major depressive disorder), recurrent severe, without psychosis ([place])   Essential hypertension   S/P CABG (coronary artery bypass graft)   Chronic kidney disease, stage III (moderate) ([name])   Malnutrition of moderate degree  Total Time spent with patient: 15 minutes  Past Psychiatric History: MDD, cocaine abuse and GAD.   Past Medical History:  Past Medical History:  Diagnosis Date  . A-fib ([place])   . Asthma    No PFTs, history of childhood asthma  . CAD (coronary artery disease)    a. 06/2013 STEMI/PCI ([facility]): LAD w/ thrombus (treated with BMS), mid 75%, D2 75%; LCX OM2 75%; RCA small, PDA 95%, PLV 95%;  b. 10/2013 Cath/PCI: ISR w/in LAD (Promus DES x  2), borderline OM2 lesion;  c. 01/2014 MV: Intermediate risk, medium-sized distal ant wall infarct w/ very small amt of peri-infarct ischemia. EF 60%.  . Cellulitis 04/2014   left facial  . Chondromalacia of medial femoral condyle    Left knee MRI 04/28/12: Chondromalacia of the medial femoral condyle with slight peripheral degeneration of the meniscocapsular junction of the medial meniscus; followed by sports medicine  . Collagen vascular disease ([place])   . Crack cocaine use    for 20+ years, has been enrolled in detox programs in the past  . Depression    with history of hospitalization for suicidal ideation  . Diabetes mellitus 2002   Diagnosed in 2002, started insulin in 2012  . Gout   . Gout 04/28/2012  . Headache(784.0)    CT head 08/2011: Periventricular and subcortical white matter hypodensities are most in keeping with chronic microangiopathic change  . HIV infection [facility]) Nov 2012   Followed by [name]  . Hyperlipidemia   . Hypertension   . Pulmonary embolism [facility])     Past Surgical History:  Procedure Laterality Date  . BACK SURGERY     1988  . BOWEL RESECTION    . CARDIAC SURGERY    . CERVICAL SPINE SURGERY     " rods in my neck "  . CORONARY ARTERY BYPASS GRAFT    . CORONARY STENT PLACEMENT    . NM MYOCAR PERF WALL MOTION  12/27/2011   normal  . SPINE SURGERY     Family History:  Family History  Problem Relation Age of Onset  . Diabetes Mother   . Hypertension Mother   . Hyperlipidemia Mother   . Diabetes Father   . Cancer Father   . Hypertension Father   . Diabetes Brother   . Heart disease Brother   . Diabetes Sister   . Colon cancer Neg Hx    Family Psychiatric  History: Denies  Social History:  Social History   Substance and Sexual Activity  Alcohol Use No  . Alcohol/week: 4.0 standard drinks  . Types: 2 Cans of beer, 2 Shots of liquor per week     Social History   Substance and Sexual Activity  Drug Use Not Currently  . Types: "Crack"  cocaine, Cocaine   Comment: last use January 2018    Social History   Socioeconomic History  . Marital status: Widowed    Spouse name: Not on file  . Number of children: 2  . Years of education: 55  . Highest education level: 12th grade  Occupational History    Employer: UNEMPLOYED    Comment: 04/2016  Social Needs  . Financial resource strain: Somewhat hard  . Food insecurity:    Worry: Sometimes true    Inability: Sometimes true  . Transportation needs:    Medical: Yes    Non-medical: Yes  Tobacco Use  . Smoking status: Never Smoker  . Smokeless tobacco: Never Used  Substance and Sexual Activity  . Alcohol use: No    Alcohol/week: 4.0 standard drinks    Types: 2 Cans of beer, 2 Shots of liquor per week  . Drug use: Not Currently    Types: "Crack" cocaine, Cocaine    Comment: last use January 2018  . Sexual activity: Yes    Comment: accepted condoms  Lifestyle  . Physical activity:    Days per week: 0 days    Minutes per session: Not on file  . Stress: Very much  Relationships  . Social connections:    Talks on phone: Once a week    Gets together: Never    Attends religious service: Never    Active member of club or organization: No    Attends meetings of clubs or organizations: Never    Relationship status: Widowed  Other Topics Concern  . Not on file  Social History Narrative   Currently staying with a friend in [place].  Was staying @ local motel until a few days ago - left b/c of bed bugs.    Sleep: Good  Appetite:  Good  Current Medications: Current Facility-Administered Medications  Medication Dose Route Frequency Provider Last Rate Last Dose  . acetaminophen (TYLENOL) tablet 650 mg  650 mg Oral Q6H PRN Lady Deutscher, MD   650 mg at 09/21/18 1040  . albuterol (PROVENTIL) (2.5 MG/3ML) 0.083% nebulizer solution 2.5 mg  2.5 mg Nebulization Q2H PRN Etta Quill, DO      . allopurinol (ZYLOPRIM) tablet 300 mg  300 mg Oral Daily Jennette Kettle M, DO    300 mg at 09/22/18 1012  . aspirin chewable tablet 81 mg  81 mg Oral Daily Etta Quill, DO   81 mg at 09/22/18 1013  . bisacodyl (DULCOLAX) EC tablet 5 mg  5 mg Oral Daily PRN Lovey Newcomer T, NP   5 mg at 09/20/18 2234  . emtricitabine-rilpivir-tenofovir AF (ODEFSEY) 200-25-25 MG per tablet 1 tablet  1 tablet Oral Q breakfast Etta Quill, DO   1 tablet at 09/22/18  1013  . enoxaparin (LOVENOX) injection 40 mg  40 mg Subcutaneous Daily Alcario Drought, Jared M, DO   40 mg at 09/22/18 1013  . feeding supplement (GLUCERNA SHAKE) (GLUCERNA SHAKE) liquid 237 mL  237 mL Oral TID BM Lady Deutscher, MD   237 mL at 09/22/18 1013  . gabapentin (NEURONTIN) capsule 400 mg  400 mg Oral BID PRN Etta Quill, DO      . hydrOXYzine (ATARAX/VISTARIL) tablet 25 mg  25 mg Oral TID PRN Etta Quill, DO      . insulin aspart (novoLOG) injection 7 Units  7 Units Subcutaneous TID WC Pokhrel, Laxman, MD   7 Units at 09/22/18 1234  . insulin glargine (LANTUS) injection 25 Units  25 Units Subcutaneous QHS Flora Lipps, MD   25 Units at 09/21/18 2209  . mometasone-formoterol (DULERA) 200-5 MCG/ACT inhaler 1 puff  1 puff Inhalation BID Etta Quill, DO   1 puff at 09/22/18 [phone]  . multivitamin with minerals tablet 1 tablet  1 tablet Oral Daily Lady Deutscher, MD   1 tablet at 09/22/18 1012  . risperiDONE (RISPERDAL) tablet 0.5 mg  0.5 mg Oral Daily Jennette Kettle M, DO   0.5 mg at 09/22/18 1013  . sertraline (ZOLOFT) tablet 50 mg  50 mg Oral Daily Pokhrel, Laxman, MD   50 mg at 09/22/18 1013  . traZODone (DESYREL) tablet 50 mg  50 mg Oral QHS Pokhrel, Laxman, MD   50 mg at 09/21/18 2209    Lab Results:  Results for orders placed or performed during the hospital encounter of 09/16/18 (from the past 48 hour(s))  Glucose, capillary     Status: Abnormal   Collection Time: 09/20/18  5:37 PM  Result Value Ref Range   Glucose-Capillary 304 (H) 70 - 99 mg/dL  Glucose, capillary     Status: Abnormal    Collection Time: 09/20/18  9:09 PM  Result Value Ref Range   Glucose-Capillary 296 (H) 70 - 99 mg/dL  Glucose, capillary     Status: Abnormal   Collection Time: 09/21/18  8:45 AM  Result Value Ref Range   Glucose-Capillary 167 (H) 70 - 99 mg/dL  Glucose, capillary     Status: Abnormal   Collection Time: 09/21/18  1:03 PM  Result Value Ref Range   Glucose-Capillary 245 (H) 70 - 99 mg/dL  Glucose, capillary     Status: Abnormal   Collection Time: 09/21/18  5:30 PM  Result Value Ref Range   Glucose-Capillary 350 (H) 70 - 99 mg/dL  Glucose, capillary     Status: Abnormal   Collection Time: 09/21/18  9:53 PM  Result Value Ref Range   Glucose-Capillary 330 (H) 70 - 99 mg/dL  Glucose, capillary     Status: Abnormal   Collection Time: 09/22/18 10:03 AM  Result Value Ref Range   Glucose-Capillary 203 (H) 70 - 99 mg/dL  Glucose, capillary     Status: Abnormal   Collection Time: 09/22/18 11:54 AM  Result Value Ref Range   Glucose-Capillary 190 (H) 70 - 99 mg/dL    Blood Alcohol level:  Lab Results  Component Value Date   ETH <10 09/02/2018   ETH <10 08/26/2018   Musculoskeletal: Strength & Muscle Tone: within normal limits Gait & Station: UTA since patient is sitting in a chair. Patient leans: N/A  Psychiatric Specialty Exam: Physical Exam  Nursing note and vitals reviewed. Constitutional: He is oriented to person, place, and time. He appears well-developed and well-nourished.  HENT:  Head: Normocephalic and atraumatic.  Neck: Normal range of motion.  Respiratory: Effort normal.  Musculoskeletal: Normal range of motion.  Neurological: He is alert and oriented to person, place, and time.  Psychiatric: He has a normal mood and affect. His speech is normal and behavior is normal. Judgment and thought content normal. Cognition and memory are normal.    Review of Systems  Respiratory: Positive for cough.   Cardiovascular: Negative for chest pain.  Gastrointestinal: Negative for  abdominal pain, constipation, diarrhea, nausea and vomiting.  Psychiatric/Behavioral: Negative for hallucinations and suicidal ideas.  All other systems reviewed and are negative.   Blood pressure (!) 151/93, pulse 78, temperature 97.9 F (36.6 C), temperature source Oral, resp. rate 18, height 5\' 8"  (1.727 m), weight 74.4 kg, SpO2 99 %.Body mass index is 24.94 kg/m.  General Appearance: Fairly Groomed, elderly, African American male, wearing a hospital gown who is sitting in a chair. NAD.   Eye Contact:  Good  Speech:  Clear and Coherent and Normal Rate  Volume:  Normal  Mood:  "Okay"  Affect:  Appropriate and Full Range  Thought Process:  Goal Directed, Linear and Descriptions of Associations: Intact  Orientation:  Full (Time, Place, and Person)  Thought Content:  Logical  Suicidal Thoughts:  No  Homicidal Thoughts:  No  Memory:  Immediate;   Good Recent;   Good Remote;   Good  Judgement:  Fair  Insight:  Fair  Psychomotor Activity:  Normal  Concentration:  Concentration: Good and Attention Span: Good  Recall:  Good  Fund of Knowledge:  Good  Language:  Good  Akathisia:  No  Handed:  Right  AIMS (if indicated):   N/A  Assets:  Communication Skills Desire for Improvement Resilience Social Support  ADL's:  Intact  Cognition:  WNL  Sleep:   Okay    Assessment:  Michael [name] is a 67 y.o. male who was admitted with acute bronchitis. He was psychiatrically cleared yesterday but upon discharge he endorsed SI with a plan to harm self. His affect is bright today. He is appropriate in behavior. His main concern is housing and appears to have endorsed SI for secondary gain to obtain shelter. He does not have any intention to harm self and his SI appears to be conditional as a means of manipulation for secondary gain to obtain shelter without a true intention to harm self. His behavior is more consistent with someone who is acting in self-preservation, rather than someone who is  acutely suicidal. He is interested in resources for the [employer] so will request SW provide patient with these resources. He does not warrant inpatient psychiatric hospitalization and should follow up with outpatient mental health services.      Treatment Plan Summary: -Continue Zoloft 50 mg daily for mood, Trazodone 50 mg qhs PRN for insomnia and Risperdal 0.5 mg daily for mood stabilization/possible psychosis.  -EKG reviewed and QTc 413 on 12/11. Please closely monitor when starting or increasing QTc prolonging agents.  -Please have SW provide patient with additional shelter resources. He has already been provided with outpatient mental health resources.  -Will sign off on patient at this time. Please consult psychiatry again as needed.    Faythe Dingwall, DO 09/22/2018, 12:50 PM

## 2018-09-23 ENCOUNTER — Other Ambulatory Visit: Payer: Self-pay | Admitting: *Deleted

## 2018-09-23 DIAGNOSIS — E1110 Type 2 diabetes mellitus with ketoacidosis without coma: Secondary | ICD-10-CM

## 2018-09-23 NOTE — Consult Note (Signed)
[facility] hospital liaison followup. Chart review reveals [name] did not warrant inpatient psychiatric admission per Psychiatrist notes.   Will go ahead and make referral for [facility] and [facility] LCSW for follow up for DM and community resources as previously planned. [name] was followed by [facility] prior to hospitalization.   Marthenia Rolling, MSN-Ed, RN,BSN [facility] Liaison [phone]

## 2018-09-24 ENCOUNTER — Other Ambulatory Visit: Payer: Self-pay | Admitting: *Deleted

## 2018-09-24 ENCOUNTER — Encounter: Payer: Self-pay | Admitting: *Deleted

## 2018-09-24 NOTE — Patient Outreach (Signed)
[facility]) Care Management  09/24/2018  Bynum Mccullars 1951-03-23 [id number]   CSW made an initial attempt to try and contact patient today to perform the initial phone assessment, as well as assess and assist with social work needs and services, without success.  A HIPAA compliant message was left for patient on voicemail.  CSW is currently awaiting a return call.  CSW will make a second outreach attempt within the next 3-4 business days, if a return call is not received from patient in the meantime.  CSW will also mail an Outreach Letter to patient's home requesting that patient contact CSW if patient is interested in receiving social work services through [place] with Scientist, clinical (histocompatibility and immunogenetics). [name], BSW, MSW, LCSW  Licensed Education officer, environmental [facility]  Mailing [place] [address] [place], [place] [address] Physical Address-[address], [place], [place] [address] Toll Free Main # [phone] Fax # [phone] Cell # [phone]  Office # [phone] [email or web address]

## 2018-09-24 NOTE — Patient Outreach (Signed)
[facility]) Care Management  09/24/2018  Michael [name] 08/27/1951 [id number]   Referral received from hospital liaison as member was recently discharged from hospital (12/11-12/17) for major depressive disorder.  Per chart, he also has history of hypertension, CHF, asthma, GERD, Hepatitis C, diabetes, and substance abuse (last tested positive for cocaine on 12/11).  Call placed to member, no answer.  HIPAA compliant voice message left, will await call back.  Unsuccessful outreach letter sent by LCSW today after her outreach was unsuccessful as well.  Will follow up with 2nd attempt within the next 4 business days.  Michael [name], [place], MSN [place] [phone]

## 2018-09-29 ENCOUNTER — Emergency Department (HOSPITAL_COMMUNITY): Payer: Medicare Other

## 2018-09-29 ENCOUNTER — Other Ambulatory Visit: Payer: Self-pay | Admitting: *Deleted

## 2018-09-29 ENCOUNTER — Other Ambulatory Visit: Payer: Self-pay

## 2018-09-29 ENCOUNTER — Emergency Department (HOSPITAL_COMMUNITY)
Admission: EM | Admit: 2018-09-29 | Discharge: 2018-09-29 | Disposition: A | Discharge status: Home / Self Care | Payer: Medicare Other | Source: Home / Self Care | Attending: Emergency Medicine | Admitting: Emergency Medicine

## 2018-09-29 ENCOUNTER — Encounter (HOSPITAL_COMMUNITY): Payer: Self-pay | Admitting: Emergency Medicine

## 2018-09-29 DIAGNOSIS — Z86718 Personal history of other venous thrombosis and embolism: Secondary | ICD-10-CM

## 2018-09-29 DIAGNOSIS — I2510 Atherosclerotic heart disease of native coronary artery without angina pectoris: Secondary | ICD-10-CM | POA: Insufficient documentation

## 2018-09-29 DIAGNOSIS — B20 Human immunodeficiency virus [HIV] disease: Secondary | ICD-10-CM

## 2018-09-29 DIAGNOSIS — I509 Heart failure, unspecified: Secondary | ICD-10-CM

## 2018-09-29 DIAGNOSIS — R0602 Shortness of breath: Secondary | ICD-10-CM | POA: Diagnosis not present

## 2018-09-29 DIAGNOSIS — Z794 Long term (current) use of insulin: Secondary | ICD-10-CM

## 2018-09-29 DIAGNOSIS — E1122 Type 2 diabetes mellitus with diabetic chronic kidney disease: Secondary | ICD-10-CM

## 2018-09-29 DIAGNOSIS — J45901 Unspecified asthma with (acute) exacerbation: Secondary | ICD-10-CM

## 2018-09-29 DIAGNOSIS — J069 Acute upper respiratory infection, unspecified: Secondary | ICD-10-CM | POA: Insufficient documentation

## 2018-09-29 DIAGNOSIS — N183 Chronic kidney disease, stage 3 (moderate): Secondary | ICD-10-CM

## 2018-09-29 DIAGNOSIS — R059 Cough, unspecified: Secondary | ICD-10-CM

## 2018-09-29 DIAGNOSIS — Z7982 Long term (current) use of aspirin: Secondary | ICD-10-CM | POA: Insufficient documentation

## 2018-09-29 DIAGNOSIS — Z79899 Other long term (current) drug therapy: Secondary | ICD-10-CM | POA: Insufficient documentation

## 2018-09-29 DIAGNOSIS — I480 Paroxysmal atrial fibrillation: Secondary | ICD-10-CM | POA: Insufficient documentation

## 2018-09-29 DIAGNOSIS — Z951 Presence of aortocoronary bypass graft: Secondary | ICD-10-CM

## 2018-09-29 DIAGNOSIS — E785 Hyperlipidemia, unspecified: Secondary | ICD-10-CM | POA: Insufficient documentation

## 2018-09-29 DIAGNOSIS — R0789 Other chest pain: Secondary | ICD-10-CM | POA: Diagnosis not present

## 2018-09-29 DIAGNOSIS — J45909 Unspecified asthma, uncomplicated: Secondary | ICD-10-CM

## 2018-09-29 DIAGNOSIS — R05 Cough: Secondary | ICD-10-CM

## 2018-09-29 DIAGNOSIS — R079 Chest pain, unspecified: Secondary | ICD-10-CM | POA: Diagnosis not present

## 2018-09-29 DIAGNOSIS — I130 Hypertensive heart and chronic kidney disease with heart failure and stage 1 through stage 4 chronic kidney disease, or unspecified chronic kidney disease: Secondary | ICD-10-CM | POA: Insufficient documentation

## 2018-09-29 DIAGNOSIS — J123 Human metapneumovirus pneumonia: Secondary | ICD-10-CM | POA: Diagnosis not present

## 2018-09-29 LAB — BASIC METABOLIC PANEL
Anion gap: 11 (ref 5–15)
BUN: 22 mg/dL (ref 8–23)
CO2: 23 mmol/L (ref 22–32)
Calcium: 9.2 mg/dL (ref 8.9–10.3)
Chloride: 103 mmol/L (ref 98–111)
Creatinine, Ser: 1.98 mg/dL — ABNORMAL HIGH (ref 0.61–1.24)
GFR calc Af Amer: 39 mL/min — ABNORMAL LOW (ref >60)
GFR calc non Af Amer: 34 mL/min — ABNORMAL LOW (ref >60)
Glucose, Bld: 305 mg/dL — ABNORMAL HIGH (ref 70–99)
Potassium: 4.1 mmol/L (ref 3.5–5.1)
SODIUM: 137 mmol/L (ref 135–145)

## 2018-09-29 LAB — RAPID URINE DRUG SCREEN, HOSP PERFORMED
Amphetamines: NOT DETECTED
Barbiturates: NOT DETECTED
Benzodiazepines: NOT DETECTED
Cocaine: NOT DETECTED
Opiates: NOT DETECTED
Tetrahydrocannabinol: NOT DETECTED

## 2018-09-29 LAB — HEPATIC FUNCTION PANEL
ALT: 19 U/L (ref 0–44)
AST: 21 U/L (ref 15–41)
Albumin: 3.3 g/dL — ABNORMAL LOW (ref 3.5–5.0)
Alkaline Phosphatase: 75 U/L (ref 38–126)
Bilirubin, Direct: <0.1 mg/dL (ref 0.0–0.2)
Total Bilirubin: 0.6 mg/dL (ref 0.3–1.2)
Total Protein: 7.1 g/dL (ref 6.5–8.1)

## 2018-09-29 LAB — CBC
HCT: 33.3 % — ABNORMAL LOW (ref 39.0–52.0)
Hemoglobin: 10.4 g/dL — ABNORMAL LOW (ref 13.0–17.0)
MCH: 25.5 pg — ABNORMAL LOW (ref 26.0–34.0)
MCHC: 31.2 g/dL (ref 30.0–36.0)
MCV: 81.6 fL (ref 80.0–100.0)
Platelets: 150 10*3/uL (ref 150–400)
RBC: 4.08 MIL/uL — ABNORMAL LOW (ref 4.22–5.81)
RDW: 13.7 % (ref 11.5–15.5)
WBC: 6.4 10*3/uL (ref 4.0–10.5)
nRBC: 0 % (ref 0.0–0.2)

## 2018-09-29 LAB — D-DIMER, QUANTITATIVE: D-Dimer, Quant: 0.42 ug/mL-FEU (ref 0.00–0.50)

## 2018-09-29 LAB — I-STAT TROPONIN, ED
Troponin i, poc: 0.01 ng/mL (ref 0.00–0.08)
Troponin i, poc: 0 ng/mL (ref 0.00–0.08)

## 2018-09-29 LAB — LIPASE, BLOOD: Lipase: 44 U/L (ref 11–51)

## 2018-09-29 MED ORDER — PREDNISONE 50 MG PO TABS: 50.0000 mg | ORAL_TABLET | Freq: Every day | ORAL | 0 refills | Status: DC

## 2018-09-29 MED ORDER — BENZONATATE 100 MG PO CAPS: 100.0000 mg | ORAL_CAPSULE | Freq: Three times a day (TID) | ORAL | 0 refills | Status: DC

## 2018-09-29 MED ORDER — BENZONATATE 100 MG PO CAPS
100.0000 mg | ORAL_CAPSULE | Freq: Once | ORAL | Status: AC
  Administered 2018-09-29: 100 mg via ORAL
  Filled 2018-09-29: qty 1

## 2018-09-29 MED ORDER — METHYLPREDNISOLONE SODIUM SUCC 125 MG IJ SOLR
125.0000 mg | Freq: Once | INTRAMUSCULAR | Status: AC
  Administered 2018-09-29: 125 mg via INTRAVENOUS
  Filled 2018-09-29: qty 2

## 2018-09-29 MED ORDER — IPRATROPIUM-ALBUTEROL 0.5-2.5 (3) MG/3ML IN SOLN
3.0000 mL | Freq: Once | RESPIRATORY_TRACT | Status: AC
  Administered 2018-09-29: 3 mL via RESPIRATORY_TRACT
  Filled 2018-09-29: qty 3

## 2018-09-29 MED ORDER — ALBUTEROL SULFATE HFA 108 (90 BASE) MCG/ACT IN AERS: 2.0000 | INHALATION_SPRAY | Freq: Once | RESPIRATORY_TRACT | Status: DC

## 2018-09-29 NOTE — ED Provider Notes (Signed)
[facility] EMERGENCY DEPARTMENT Provider Note   CSN: [id number] Arrival date & time: 09/29/18  1525     History   Chief Complaint Chief Complaint  Patient presents with  . Chest Pain  . Shortness of Breath    HPI Michael [name] is a 67 y.o. male.  The history is provided by the patient and medical records. No language interpreter was used.  Chest Pain   This is a recurrent problem. The current episode started more than 2 days ago. The problem occurs constantly. The problem has not changed since onset.The pain is associated with coughing and breathing. The pain is present in the substernal region. The pain is at a severity of 9/10. The pain is severe. The quality of the pain is described as sharp, pleuritic and stabbing. The pain radiates to the right shoulder. The symptoms are aggravated by deep breathing. Associated symptoms include cough, diaphoresis, malaise/fatigue and shortness of breath. Pertinent negatives include no abdominal pain, no back pain, no claudication, no dizziness, no exertional chest pressure, no fever, no headaches, no irregular heartbeat, no lower extremity edema, no nausea, no near-syncope, no numbness, no palpitations, no sputum production, no syncope, no vomiting and no weakness. He has tried nothing for the symptoms. The treatment provided no relief.  Shortness of Breath  Associated symptoms include cough, wheezing and chest pain. Pertinent negatives include no fever, no headaches, no neck pain, no sputum production, no syncope, no vomiting, no abdominal pain, no leg swelling and no claudication.    Past Medical History:  Diagnosis Date  . A-fib ([place])   . Asthma    No PFTs, history of childhood asthma  . CAD (coronary artery disease)    a. 06/2013 STEMI/PCI ([facility]): LAD w/ thrombus (treated with BMS), mid 75%, D2 75%; LCX OM2 75%; RCA small, PDA 95%, PLV 95%;  b. 10/2013 Cath/PCI: ISR w/in LAD (Promus DES x 2), borderline OM2 lesion;  c.  01/2014 MV: Intermediate risk, medium-sized distal ant wall infarct w/ very small amt of peri-infarct ischemia. EF 60%.  . Cellulitis 04/2014   left facial  . Chondromalacia of medial femoral condyle    Left knee MRI 04/28/12: Chondromalacia of the medial femoral condyle with slight peripheral degeneration of the meniscocapsular junction of the medial meniscus; followed by sports medicine  . Collagen vascular disease ([place])   . Crack cocaine use    for 20+ years, has been enrolled in detox programs in the past  . Depression    with history of hospitalization for suicidal ideation  . Diabetes mellitus 2002   Diagnosed in 2002, started insulin in 2012  . Gout   . Gout 04/28/2012  . Headache(784.0)    CT head 08/2011: Periventricular and subcortical white matter hypodensities are most in keeping with chronic microangiopathic change  . HIV infection [facility]) Nov 2012   Followed by [name]  . Hyperlipidemia   . Hypertension   . Pulmonary embolism [facility])     Patient Active Problem List   Diagnosis Date Noted  . Malnutrition of moderate degree 09/21/2018  . MDD (major depressive disorder), recurrent episode, moderate ([place])   . Type II diabetes mellitus with renal manifestations ([place]) 05/04/2018  . GERD (gastroesophageal reflux disease) 05/04/2018  . S/P CABG (coronary artery bypass graft)   . Left testicular pain   . Depression 02/20/2018  . Epididymo-orchitis, acute 02/20/2018  . Essential hypertension 02/20/2018  . Precordial chest pain 02/20/2018  . AKI (acute kidney injury) ([place])  02/14/2018  . HCAP (healthcare-associated pneumonia) 02/14/2018  . History of pulmonary embolism 07/04/2017  . Acute hyponatremia 05/11/2017  . Hyperglycemia due to type 2 diabetes mellitus ([place]) 05/11/2017  . CHF (congestive heart failure) ([place]) 04/01/2017  . Hepatitis C 12/30/2016  . Chronic ischemic heart disease 11/12/2016  . Paroxysmal A-fib ([place]) 11/12/2016  . Back pain 04/18/2016  . S/P carotid  endarterectomy 11/15/2015  . MDD (major depressive disorder), recurrent severe, without psychosis ([place]) 09/09/2015  . Cocaine-induced mood disorder ([place]) 08/14/2015  . Cocaine abuse with cocaine-induced mood disorder ([place]) 08/14/2015  . Gout 07/10/2015  . Acute renal failure superimposed on stage 3 chronic kidney disease ([place]) 03/06/2015  . Normocytic anemia 03/06/2015  . Chronic kidney disease, stage III (moderate) ([place]) 03/06/2015  . Hypoglycemia   . Encounter for general adult medical examination with abnormal findings 02/09/2015  . Cocaine use disorder, severe, dependence ([place]) 12/13/2014  . Substance or medication-induced depressive disorder with onset during withdrawal ([place]) 12/13/2014  . Severe recurrent major depressive disorder with psychotic features ([place]) 12/12/2014  . Cervicalgia 06/28/2014  . Lumbar radiculopathy, chronic 06/28/2014  . Asthma, chronic 02/03/2014  . 3-vessel CAD 06/24/2013  . ED (erectile dysfunction) of organic origin 07/07/2012  . Hypertension goal BP (blood pressure) < 140/80 04/29/2012  . Chondromalacia of left knee 03/19/2012  . Hyperlipidemia with target LDL less than 100 02/12/2012  . Fibromyalgia 02/12/2012  . HIV (human immunodeficiency virus infection) ([place]) 08/27/2011  . Uncontrolled type 2 diabetes with neuropathy ([place]) 10/17/2000    Past Surgical History:  Procedure Laterality Date  . BACK SURGERY     1988  . BOWEL RESECTION    . CARDIAC SURGERY    . CERVICAL SPINE SURGERY     " rods in my neck "  . CORONARY ARTERY BYPASS GRAFT    . CORONARY STENT PLACEMENT    . NM MYOCAR PERF WALL MOTION  12/27/2011   normal  . SPINE SURGERY          Home Medications    Prior to Admission medications   Medication Sig Start Date End Date Taking? Authorizing Provider  ACCU-CHEK SOFTCLIX LANCETS lancets USE TO CHECK BLOOD GLUCOSE LEVELS UP TO 4 TIMES DAILY AS DIRECTED 11/24/17   Lindell Spar I, NP  albuterol (PROAIR HFA) 108 (90 Base) MCG/ACT  inhaler INHALE TWO PUFFS INTO THE LUNGS EVERY 6 (SIX) HOURS AS NEEDED FOR WHEEZING OR SHORTNESS OF BREATH. Patient taking differently: Inhale 2 puffs into the lungs every 6 (six) hours as needed for wheezing or shortness of breath.  11/24/17   Lindell Spar I, NP  allopurinol (ZYLOPRIM) 300 MG tablet Take 1 tablet (300 mg total) by mouth daily. For gout. 11/25/17   Lindell Spar I, NP  aspirin 81 MG chewable tablet Chew 81 mg by mouth daily.    [provider]  emtricitabine-rilpivir-tenofovir AF (ODEFSEY) 200-25-25 MG TABS tablet Take 1 tablet by mouth daily with breakfast. For HIV infection 11/24/17   Lindell Spar I, NP  gabapentin (NEURONTIN) 400 MG capsule Take 1 capsule (400 mg total) by mouth 2 (two) times daily. For agitation/neuropathic pain Patient taking differently: Take 400 mg by mouth 2 (two) times daily as needed (For agitation/neuropathic pain).  11/24/17   Lindell Spar I, NP  hydrOXYzine (ATARAX/VISTARIL) 25 MG tablet Take 1 tablet (25 mg total) by mouth 3 (three) times daily as needed for anxiety. 11/24/17   Lindell Spar I, NP  insulin glargine (LANTUS) 100 UNIT/ML injection Inject 0.3 mLs (  30 Units total) into the skin at bedtime. For diabetes management 08/04/18   [name], MD  insulin lispro (HUMALOG) 100 UNIT/ML injection Inject 0.05 mLs (5 Units total) into the skin 3 (three) times daily with meals. For diabetes management Patient not taking: Reported on 08/26/2018 11/24/17   Lindell Spar I, NP  Insulin Syringe-Needle U-100 (B-D INS SYR MICROFINE 1CC/27G) 27G X 5/8" 1 ML MISC 1 strip by Does not apply route 3 (three) times daily as needed. For blood sugar checks 11/24/17   Lindell Spar I, NP  metFORMIN (GLUCOPHAGE) 500 MG tablet Take 1 tablet (500 mg total) by mouth 2 (two) times daily with a meal. 08/04/18   [name], MD  risperiDONE (RISPERDAL) 0.5 MG tablet Take 0.5 mg by mouth daily. 06/16/17   [provider]  sertraline (ZOLOFT) 50 MG tablet Take 1  tablet (50 mg total) by mouth daily. 09/22/18   Lady Deutscher, MD  traZODone (DESYREL) 50 MG tablet Take 1 tablet (50 mg total) by mouth at bedtime. 09/21/18   Lady Deutscher, MD    Family History Family History  Problem Relation Age of Onset  . Diabetes Mother   . Hypertension Mother   . Hyperlipidemia Mother   . Diabetes Father   . Cancer Father   . Hypertension Father   . Diabetes Brother   . Heart disease Brother   . Diabetes Sister   . Colon cancer Neg Hx     Social History Social History   Tobacco Use  . Smoking status: Never Smoker  . Smokeless tobacco: Never Used  Substance Use Topics  . Alcohol use: No    Alcohol/week: 4.0 standard drinks    Types: 2 Cans of beer, 2 Shots of liquor per week  . Drug use: Not Currently    Types: "Crack" cocaine, Cocaine    Comment: last use January 2018     Allergies   Patient has no known allergies.   Review of Systems Review of Systems  Constitutional: Positive for chills, diaphoresis, fatigue and malaise/fatigue. Negative for fever.  HENT: Positive for congestion.   Eyes: Negative for visual disturbance.  Respiratory: Positive for cough, chest tightness, shortness of breath and wheezing. Negative for sputum production, choking and stridor.   Cardiovascular: Positive for chest pain. Negative for palpitations, claudication, leg swelling, syncope and near-syncope.  Gastrointestinal: Negative for abdominal pain, constipation, diarrhea, nausea and vomiting.  Genitourinary: Negative for dysuria.  Musculoskeletal: Negative for back pain, neck pain and neck stiffness.  Neurological: Negative for dizziness, weakness, light-headedness, numbness and headaches.  All other systems reviewed and are negative.    Physical Exam Updated Vital Signs BP (!) 155/73 (BP Location: Left Arm)   Pulse (!) 115   Temp 98.3 F (36.8 C) (Oral)   Resp (!) 28   Ht 5\' 8"  (1.727 m)   Wt 74.4 kg   SpO2 100%   BMI 24.94 kg/m    Physical Exam Vitals signs and nursing note reviewed.  Constitutional:      General: He is not in acute distress.    Appearance: He is well-developed. He is not ill-appearing, toxic-appearing or diaphoretic.  HENT:     Head: Normocephalic and atraumatic.  Eyes:     Conjunctiva/sclera: Conjunctivae normal.     Pupils: Pupils are equal, round, and reactive to light.  Neck:     Musculoskeletal: Neck supple.  Cardiovascular:     Rate and Rhythm: Regular rhythm. Tachycardia present.  Heart sounds: No murmur.  Pulmonary:     Effort: Pulmonary effort is normal. Tachypnea present. No accessory muscle usage or respiratory distress.     Breath sounds: Wheezing present. No rhonchi or rales.  Chest:     Chest wall: Tenderness present. No deformity or edema.  Abdominal:     General: Bowel sounds are normal. There is no abdominal bruit.     Palpations: Abdomen is soft.     Tenderness: There is no abdominal tenderness.  Musculoskeletal:     Right lower leg: No edema.     Left lower leg: No edema.  Skin:    General: Skin is warm and dry.     Capillary Refill: Capillary refill takes less than 2 seconds.     Coloration: Skin is not cyanotic.     Findings: No rash.  Neurological:     Mental Status: He is alert.  Psychiatric:        Mood and Affect: Mood normal.      ED Treatments / Results  Labs (all labs ordered are listed, but only abnormal results are displayed) Labs Reviewed  BASIC METABOLIC PANEL - Abnormal; Notable for the following components:      Result Value   Glucose, Bld 305 (*)    Creatinine, Ser 1.98 (*)    GFR calc non Af Amer 34 (*)    GFR calc Af Amer 39 (*)    All other components within normal limits  CBC - Abnormal; Notable for the following components:   RBC 4.08 (*)    Hemoglobin 10.4 (*)    HCT 33.3 (*)    MCH 25.5 (*)    All other components within normal limits  HEPATIC FUNCTION PANEL - Abnormal; Notable for the following components:   Albumin  3.3 (*)    All other components within normal limits  LIPASE, BLOOD  D-DIMER, QUANTITATIVE (NOT AT [facility])  RAPID URINE DRUG SCREEN, HOSP PERFORMED  I-STAT TROPONIN, ED  I-STAT TROPONIN, ED    EKG EKG Interpretation  Date/Time:  Tuesday September 29 2018 15:34:03 EST Ventricular Rate:  105 PR Interval:  132 QRS Duration: 88 QT Interval:  328 QTC Calculation: 433 R Axis:   73 Text Interpretation:  Sinus tachycardia Possible Right ventricular hypertrophy Septal infarct , age undetermined Abnormal ECG When compared to prior, possible t wave inversion in lead AVF.  No STEMI Confirmed by Antony Blackbird [phone]) on 09/29/2018 3:45:09 PM   Radiology Dg Chest 2 View  Result Date: 09/29/2018 CLINICAL DATA:  Cough for 2 days. EXAM: CHEST - 2 VIEW COMPARISON:  09/16/2018 FINDINGS: Heart size is normal. Coronary artery stent noted as well as prior CABG. Chronic elevation of right hemidiaphragm is stable. Mild scarring noted in right lung base. No evidence of pulmonary infiltrate or edema. No evidence of pleural effusion. Cervical spine fusion hardware is also noted. IMPRESSION: Stable elevation of right hemidiaphragm and mild right basilar scarring. No active cardiopulmonary disease. Electronically Signed   By: Earle Gell M.D.   On: 09/29/2018 16:21    Procedures Procedures (including critical care time)  Medications Ordered in ED Medications  albuterol (PROVENTIL HFA;VENTOLIN HFA) 108 (90 Base) MCG/ACT inhaler 2 puff (has no administration in time range)  ipratropium-albuterol (DUONEB) 0.5-2.5 (3) MG/3ML nebulizer solution 3 mL (3 mLs Nebulization Given 09/29/18 1623)  methylPREDNISolone sodium succinate (SOLU-MEDROL) 125 mg/2 mL injection 125 mg (125 mg Intravenous Given 09/29/18 1623)  benzonatate (TESSALON) capsule 100 mg (100 mg Oral Given 09/29/18 1707)  Initial Impression / Assessment and Plan / ED Course  I have reviewed the triage vital signs and the nursing notes.  Pertinent  labs & imaging results that were available during my care of the patient were reviewed by me and considered in my medical decision making (see chart for details).     Michael [name] is a 67 y.o. male with a past medical history significant for asthma, HIV, diabetes, CAD status post CABG, previous cocaine abuse, CKD, hyperlipidemia, prior pulmonary embolism, paroxysmal atrial fibrillation not on anticoagulation, and hepatitis C who presents with chest pain, shortness of breath, congestion, cough, wheezing, and chills.  Patient reports that he was discharged last week after admission for similar chest pain and shortness of breath.  He says it was his asthma acting up.  He says that he does not use cocaine and several months.  He reports that his breathing is worsened over the last several days and despite using inhalers at home, he is had worsened wheezing.  He has had dry cough and subjective fevers and chills.  He reports some diaphoresis.  He reports the pain is a tightness and sharpness that is worsened when he is coughing or breathing deeply.  He does not remember what a pulmonary was him felt like in the past.  He reports no significant leg swelling.  He reports no nausea vomiting, urinary symptoms or GI symptoms.  Denies recent trauma.  On exam, patient is tachycardic.  He has wheezing all lung fields and some chest tenderness.  Patient has symmetric pulses in his arms and has no significant leg swelling.  Abdomen nontender.  Back nontender.  Patient tachypneic and tachycardic on monitor evaluation.  EKG shows no evidence of STEMI.  Clinically I suspect patient is having a asthma exacerbation in the setting of URI with rhinorrhea congestion chills and cough.  Patient will give steroids and a breathing treatment and will also have work-up including lab work and chest x-ray given his history of CAD and PE.  Anticipate reassessment after work-up.  9:41 PM Patient's delta troponin was negative  and d-dimer was negative.  UDS was negative.  Hepatic function and BMP are reassuring aside from mild hyperglycemia.  Lipase not elevated.  Creatinine slightly more elevated than prior at 1.9.  Hemoglobin improved prior.  Chest x-ray shows no pneumonia.  Clinical suspect combination of asthma exacerbation prompted by viral upper respiratory infection with the rhinorrhea congestion and cough.  No evidence of pneumonia on work-up.  Pain is reproducible with palpation and his symptoms not seem to be exertional.  Patient given prescription for a burst of steroids as well as inhaler.  Patient will be given prescription for Tessalon which helped the patient in the emergency department.  Patient given strict return precautions and follow-up instructions.  Patient no other questions or concerns and had reassuring vital signs and reassessment.  Patient discharged in good condition with improving symptoms.  Final Clinical Impressions(s) / ED Diagnoses   Final diagnoses:  Exacerbation of asthma, unspecified asthma severity, unspecified whether persistent  Cough  Shortness of breath  Upper respiratory tract infection, unspecified type    ED Discharge Orders         Ordered    predniSONE (DELTASONE) 50 MG tablet  Daily     09/29/18 2145    benzonatate (TESSALON) 100 MG capsule  Every 8 hours     09/29/18 2145          Clinical Impression: 1. Exacerbation of asthma,  unspecified asthma severity, unspecified whether persistent   2. Cough   3. Shortness of breath   4. Upper respiratory tract infection, unspecified type     Disposition: Discharge  Condition: Good  I have discussed the results, Dx and Tx plan with the pt(& family if present). He/she/they expressed understanding and agree(s) with the plan. Discharge instructions discussed at great length. Strict return precautions discussed and pt &/or family have verbalized understanding of the instructions. No further questions at time of  discharge.    New Prescriptions   BENZONATATE (TESSALON) 100 MG CAPSULE    Take 1 capsule (100 mg total) by mouth every 8 (eight) hours.   PREDNISONE (DELTASONE) 50 MG TABLET    Take 1 tablet (50 mg total) by mouth daily for 4 days.    Follow Up: [name], PA-C [address] [place] [address] [facility] DE[facility] [address] [id number] mc [place]       Aysen Shieh, [name], MD 09/29/18 [phone]

## 2018-09-29 NOTE — ED Notes (Signed)
ED Provider at bedside. 

## 2018-09-29 NOTE — Patient Outreach (Signed)
[place] [facility]) Care Management  09/29/2018  Michael [name] Dec 26, 1950 [id number]   CSW made a second attempt to try and contact patient today to perform phone assessment, as well as assess and assist with social work needs and services, without success.  A HIPAA compliant message was left for patient on voicemail.  CSW continues to await a return call.  CSW will make a third and final outreach attempt within the next 3-4 business days, if a return call is not received from patient in the meantime.  CSW will then proceed with case closure if a return call is not received from patient with a total of 10 business days, as required number of phone attempts will have been made and outreach letter mailed.  [name], BSW, MSW, LCSW  Licensed Education officer, [facility]  Mailing [place] [address], [place], [place] [address] Physical Address-[address], [place], [place] [address] Toll Free Main # [phone] Fax # [phone] Cell # [phone]  Office # [phone] [email or web address]

## 2018-09-29 NOTE — ED Notes (Signed)
Dr. Tegeler at bedside.  

## 2018-09-29 NOTE — ED Notes (Signed)
Pt ambulated unassisted through hall maintaining o2 saturation > 99% throughout

## 2018-09-29 NOTE — Discharge Instructions (Signed)
Your work-up today was consistent with asthma exacerbation as we did not find evidence of pneumonia, evidence of heart injury, or evidence of blood clot.  As your breathing improved with breathing treatment and the cough medicine we feel you are safe for discharge home with prescriptions for these medicines.  Please use the steroids for the next 4 days starting tomorrow.  Please follow-up with your primary doctor.  If any symptoms change or worsen or do not improve, please return to the nearest emergency department.  Please rest and stay hydrated.

## 2018-09-29 NOTE — ED Triage Notes (Signed)
Pt reports recently discharged from hospital for same thing. Central cp and sob, also a cough that started two days ago. Pt had bypass surgery in May.

## 2018-09-29 NOTE — ED Notes (Signed)
Patient verbalizes understanding of discharge instructions. Opportunity for questioning and answers were provided. Armband removed by staff, pt discharged from ED, ambulatory to lobby.  

## 2018-10-01 ENCOUNTER — Other Ambulatory Visit: Payer: Self-pay | Admitting: *Deleted

## 2018-10-01 NOTE — Patient Outreach (Signed)
[place] [facility]) Care Management  10/01/2018  Michael [name] 03/29/1951 [id number]   2nd attempt made to contact member for transition of care, no answer.  HIPAA compliant voice message left.  Will follow up within the next 4 business days.  Valente David, [place], MSN [place] [phone]

## 2018-10-02 ENCOUNTER — Emergency Department (HOSPITAL_COMMUNITY): Payer: Medicare Other

## 2018-10-02 ENCOUNTER — Encounter (HOSPITAL_COMMUNITY): Payer: Self-pay | Admitting: Pharmacy Technician

## 2018-10-02 ENCOUNTER — Inpatient Hospital Stay (HOSPITAL_COMMUNITY)
Admission: EM | Admit: 2018-10-02 | Discharge: 2018-10-07 | DRG: 194 | Disposition: A | Discharge status: Home / Self Care | Payer: Medicare Other | Attending: Internal Medicine | Admitting: Internal Medicine

## 2018-10-02 ENCOUNTER — Other Ambulatory Visit: Payer: Self-pay

## 2018-10-02 DIAGNOSIS — Z8349 Family history of other endocrine, nutritional and metabolic diseases: Secondary | ICD-10-CM

## 2018-10-02 DIAGNOSIS — I2510 Atherosclerotic heart disease of native coronary artery without angina pectoris: Secondary | ICD-10-CM | POA: Diagnosis not present

## 2018-10-02 DIAGNOSIS — N183 Chronic kidney disease, stage 3 unspecified: Secondary | ICD-10-CM | POA: Diagnosis present

## 2018-10-02 DIAGNOSIS — Z6826 Body mass index (BMI) 26.0-26.9, adult: Secondary | ICD-10-CM | POA: Diagnosis not present

## 2018-10-02 DIAGNOSIS — E1165 Type 2 diabetes mellitus with hyperglycemia: Secondary | ICD-10-CM

## 2018-10-02 DIAGNOSIS — F1414 Cocaine abuse with cocaine-induced mood disorder: Secondary | ICD-10-CM

## 2018-10-02 DIAGNOSIS — J45901 Unspecified asthma with (acute) exacerbation: Secondary | ICD-10-CM | POA: Diagnosis present

## 2018-10-02 DIAGNOSIS — M94262 Chondromalacia, left knee: Secondary | ICD-10-CM | POA: Diagnosis not present

## 2018-10-02 DIAGNOSIS — E1140 Type 2 diabetes mellitus with diabetic neuropathy, unspecified: Secondary | ICD-10-CM

## 2018-10-02 DIAGNOSIS — I499 Cardiac arrhythmia, unspecified: Secondary | ICD-10-CM | POA: Diagnosis not present

## 2018-10-02 DIAGNOSIS — Y95 Nosocomial condition: Secondary | ICD-10-CM | POA: Diagnosis present

## 2018-10-02 DIAGNOSIS — Z79899 Other long term (current) drug therapy: Secondary | ICD-10-CM

## 2018-10-02 DIAGNOSIS — I10 Essential (primary) hypertension: Secondary | ICD-10-CM

## 2018-10-02 DIAGNOSIS — Z955 Presence of coronary angioplasty implant and graft: Secondary | ICD-10-CM | POA: Diagnosis not present

## 2018-10-02 DIAGNOSIS — K219 Gastro-esophageal reflux disease without esophagitis: Secondary | ICD-10-CM | POA: Diagnosis present

## 2018-10-02 DIAGNOSIS — E1122 Type 2 diabetes mellitus with diabetic chronic kidney disease: Secondary | ICD-10-CM | POA: Diagnosis not present

## 2018-10-02 DIAGNOSIS — Z794 Long term (current) use of insulin: Secondary | ICD-10-CM

## 2018-10-02 DIAGNOSIS — J189 Pneumonia, unspecified organism: Secondary | ICD-10-CM | POA: Diagnosis not present

## 2018-10-02 DIAGNOSIS — R000 Tachycardia, unspecified: Secondary | ICD-10-CM | POA: Diagnosis not present

## 2018-10-02 DIAGNOSIS — R070 Pain in throat: Secondary | ICD-10-CM | POA: Diagnosis not present

## 2018-10-02 DIAGNOSIS — M797 Fibromyalgia: Secondary | ICD-10-CM | POA: Diagnosis not present

## 2018-10-02 DIAGNOSIS — IMO0002 Reserved for concepts with insufficient information to code with codable children: Secondary | ICD-10-CM | POA: Diagnosis present

## 2018-10-02 DIAGNOSIS — M359 Systemic involvement of connective tissue, unspecified: Secondary | ICD-10-CM | POA: Diagnosis present

## 2018-10-02 DIAGNOSIS — Z981 Arthrodesis status: Secondary | ICD-10-CM

## 2018-10-02 DIAGNOSIS — Z8249 Family history of ischemic heart disease and other diseases of the circulatory system: Secondary | ICD-10-CM

## 2018-10-02 DIAGNOSIS — E785 Hyperlipidemia, unspecified: Secondary | ICD-10-CM

## 2018-10-02 DIAGNOSIS — Z590 Homelessness: Secondary | ICD-10-CM

## 2018-10-02 DIAGNOSIS — Z8701 Personal history of pneumonia (recurrent): Secondary | ICD-10-CM

## 2018-10-02 DIAGNOSIS — R079 Chest pain, unspecified: Secondary | ICD-10-CM | POA: Diagnosis not present

## 2018-10-02 DIAGNOSIS — R05 Cough: Secondary | ICD-10-CM | POA: Diagnosis not present

## 2018-10-02 DIAGNOSIS — M109 Gout, unspecified: Secondary | ICD-10-CM | POA: Diagnosis present

## 2018-10-02 DIAGNOSIS — Z86711 Personal history of pulmonary embolism: Secondary | ICD-10-CM | POA: Diagnosis not present

## 2018-10-02 DIAGNOSIS — R52 Pain, unspecified: Secondary | ICD-10-CM | POA: Diagnosis not present

## 2018-10-02 DIAGNOSIS — I480 Paroxysmal atrial fibrillation: Secondary | ICD-10-CM | POA: Diagnosis not present

## 2018-10-02 DIAGNOSIS — J168 Pneumonia due to other specified infectious organisms: Secondary | ICD-10-CM | POA: Diagnosis not present

## 2018-10-02 DIAGNOSIS — B20 Human immunodeficiency virus [HIV] disease: Secondary | ICD-10-CM

## 2018-10-02 DIAGNOSIS — Z21 Asymptomatic human immunodeficiency virus [HIV] infection status: Secondary | ICD-10-CM | POA: Diagnosis present

## 2018-10-02 DIAGNOSIS — J123 Human metapneumovirus pneumonia: Principal | ICD-10-CM | POA: Diagnosis present

## 2018-10-02 DIAGNOSIS — I129 Hypertensive chronic kidney disease with stage 1 through stage 4 chronic kidney disease, or unspecified chronic kidney disease: Secondary | ICD-10-CM | POA: Diagnosis not present

## 2018-10-02 DIAGNOSIS — Z951 Presence of aortocoronary bypass graft: Secondary | ICD-10-CM

## 2018-10-02 DIAGNOSIS — Z7982 Long term (current) use of aspirin: Secondary | ICD-10-CM

## 2018-10-02 DIAGNOSIS — E11649 Type 2 diabetes mellitus with hypoglycemia without coma: Secondary | ICD-10-CM | POA: Diagnosis not present

## 2018-10-02 DIAGNOSIS — Z9049 Acquired absence of other specified parts of digestive tract: Secondary | ICD-10-CM

## 2018-10-02 DIAGNOSIS — R0602 Shortness of breath: Secondary | ICD-10-CM

## 2018-10-02 DIAGNOSIS — E440 Moderate protein-calorie malnutrition: Secondary | ICD-10-CM | POA: Diagnosis present

## 2018-10-02 DIAGNOSIS — F329 Major depressive disorder, single episode, unspecified: Secondary | ICD-10-CM | POA: Diagnosis present

## 2018-10-02 DIAGNOSIS — Z833 Family history of diabetes mellitus: Secondary | ICD-10-CM

## 2018-10-02 DIAGNOSIS — Z7951 Long term (current) use of inhaled steroids: Secondary | ICD-10-CM

## 2018-10-02 LAB — CBC WITH DIFFERENTIAL/PLATELET
Abs Immature Granulocytes: 0.04 10*3/uL (ref 0.00–0.07)
Basophils Absolute: 0 10*3/uL (ref 0.0–0.1)
Basophils Relative: 0 %
Eosinophils Absolute: 0.2 10*3/uL (ref 0.0–0.5)
Eosinophils Relative: 4 %
HCT: 30.2 % — ABNORMAL LOW (ref 39.0–52.0)
Hemoglobin: 9.2 g/dL — ABNORMAL LOW (ref 13.0–17.0)
Immature Granulocytes: 1 %
Lymphocytes Relative: 21 %
Lymphs Abs: 1.1 10*3/uL (ref 0.7–4.0)
MCH: 24.7 pg — ABNORMAL LOW (ref 26.0–34.0)
MCHC: 30.5 g/dL (ref 30.0–36.0)
MCV: 81.2 fL (ref 80.0–100.0)
Monocytes Absolute: 0.5 10*3/uL (ref 0.1–1.0)
Monocytes Relative: 10 %
Neutro Abs: 3.3 10*3/uL (ref 1.7–7.7)
Neutrophils Relative %: 64 %
Platelets: 129 10*3/uL — ABNORMAL LOW (ref 150–400)
RBC: 3.72 MIL/uL — AB (ref 4.22–5.81)
RDW: 13.9 % (ref 11.5–15.5)
WBC: 5.1 10*3/uL (ref 4.0–10.5)
nRBC: 0 % (ref 0.0–0.2)

## 2018-10-02 LAB — BASIC METABOLIC PANEL
Anion gap: 7 (ref 5–15)
BUN: 20 mg/dL (ref 8–23)
CO2: 25 mmol/L (ref 22–32)
Calcium: 8.8 mg/dL — ABNORMAL LOW (ref 8.9–10.3)
Chloride: 104 mmol/L (ref 98–111)
Creatinine, Ser: 1.9 mg/dL — ABNORMAL HIGH (ref 0.61–1.24)
GFR calc Af Amer: 41 mL/min — ABNORMAL LOW (ref >60)
GFR calc non Af Amer: 35 mL/min — ABNORMAL LOW (ref >60)
Glucose, Bld: 485 mg/dL — ABNORMAL HIGH (ref 70–99)
Potassium: 4 mmol/L (ref 3.5–5.1)
SODIUM: 136 mmol/L (ref 135–145)

## 2018-10-02 LAB — IRON AND TIBC
Iron: 21 ug/dL — ABNORMAL LOW (ref 45–182)
Saturation Ratios: 6 % — ABNORMAL LOW (ref 17.9–39.5)
TIBC: 335 ug/dL (ref 250–450)
UIBC: 314 ug/dL

## 2018-10-02 LAB — D-DIMER, QUANTITATIVE: D-Dimer, Quant: 0.68 ug/mL-FEU — ABNORMAL HIGH (ref 0.00–0.50)

## 2018-10-02 LAB — CBG MONITORING, ED
Glucose-Capillary: 433 mg/dL — ABNORMAL HIGH (ref 70–99)
Glucose-Capillary: 343 mg/dL — ABNORMAL HIGH (ref 70–99)
Glucose-Capillary: 163 mg/dL — ABNORMAL HIGH (ref 70–99)
Glucose-Capillary: 130 mg/dL — ABNORMAL HIGH (ref 70–99)

## 2018-10-02 LAB — FOLATE: Folate: 10.9 ng/mL (ref >5.9)

## 2018-10-02 LAB — I-STAT VENOUS BLOOD GAS, ED
Acid-base deficit: 1 mmol/L (ref 0.0–2.0)
Bicarbonate: 23.8 mmol/L (ref 20.0–28.0)
O2 Saturation: 78 %
PO2 VEN: 41 mmHg (ref 32.0–45.0)
TCO2: 25 mmol/L (ref 22–32)
pCO2, Ven: 37.1 mmHg — ABNORMAL LOW (ref 44.0–60.0)
pH, Ven: 7.416 (ref 7.250–7.430)

## 2018-10-02 LAB — TROPONIN I: Troponin I: <0.03 ng/mL (ref <0.03)

## 2018-10-02 LAB — FERRITIN: Ferritin: 28 ng/mL (ref 24–336)

## 2018-10-02 LAB — RETICULOCYTES
Immature Retic Fract: 17.1 % — ABNORMAL HIGH (ref 2.3–15.9)
RBC.: 3.32 MIL/uL — ABNORMAL LOW (ref 4.22–5.81)
Retic Count, Absolute: 48.8 10*3/uL (ref 19.0–186.0)
Retic Ct Pct: 1.5 % (ref 0.4–3.1)

## 2018-10-02 LAB — INFLUENZA PANEL BY PCR (TYPE A & B)
Influenza A By PCR: NEGATIVE
Influenza B By PCR: NEGATIVE

## 2018-10-02 LAB — VITAMIN B12: Vitamin B-12: 298 pg/mL (ref 180–914)

## 2018-10-02 MED ORDER — IOPAMIDOL (ISOVUE-370) INJECTION 76%
INTRAVENOUS | Status: AC
  Filled 2018-10-02: qty 100

## 2018-10-02 MED ORDER — IOPAMIDOL (ISOVUE-370) INJECTION 76%
100.0000 mL | Freq: Once | INTRAVENOUS | Status: AC | PRN
  Administered 2018-10-02: 100 mL via INTRAVENOUS

## 2018-10-02 MED ORDER — INSULIN ASPART 100 UNIT/ML ~~LOC~~ SOLN
10.0000 [IU] | Freq: Once | SUBCUTANEOUS | Status: AC
  Administered 2018-10-02: 10 [IU] via SUBCUTANEOUS

## 2018-10-02 MED ORDER — VANCOMYCIN HCL 10 G IV SOLR
1500.0000 mg | INTRAVENOUS | Status: AC
  Administered 2018-10-02: 1500 mg via INTRAVENOUS
  Filled 2018-10-02: qty 1500

## 2018-10-02 MED ORDER — SODIUM CHLORIDE 0.9 % IV BOLUS
1000.0000 mL | Freq: Once | INTRAVENOUS | Status: AC
  Administered 2018-10-02: 1000 mL via INTRAVENOUS

## 2018-10-02 MED ORDER — SODIUM CHLORIDE 0.9 % IV SOLN
1.0000 g | Freq: Once | INTRAVENOUS | Status: AC
  Administered 2018-10-02: 1 g via INTRAVENOUS
  Filled 2018-10-02: qty 1

## 2018-10-02 MED ORDER — VANCOMYCIN HCL 10 G IV SOLR
1500.0000 mg | INTRAVENOUS | Status: DC
  Administered 2018-10-04: 1500 mg via INTRAVENOUS
  Filled 2018-10-02: qty 1500

## 2018-10-02 MED ORDER — IPRATROPIUM-ALBUTEROL 0.5-2.5 (3) MG/3ML IN SOLN
3.0000 mL | Freq: Once | RESPIRATORY_TRACT | Status: AC
  Administered 2018-10-02: 3 mL via RESPIRATORY_TRACT
  Filled 2018-10-02: qty 3

## 2018-10-02 NOTE — ED Notes (Signed)
Patient transported to X-ray 

## 2018-10-02 NOTE — ED Provider Notes (Signed)
[facility] EMERGENCY DEPARTMENT Provider Note   CSN: [id number] Arrival date & time: 10/02/18  1404     History   Chief Complaint Chief Complaint  Patient presents with  . Shortness of Breath    HPI Michael [name] is a 67 y.o. male.  67 yo male presents to the ER with complaint of non productive cough, congestion, feeling SHOB with chest tightness and wheezing, using his inhaler without relief, onset of symptoms 1 week ago, progressively worsening. Denies chest pain. Unsure if he has had fevers but reports having chills. Patient has a history of PE, not on anticoagulant at this time. Patient was seen in the ER with similar symptoms 3 days ago, given steroids for asthma exacerbation. Patient has not taken his insulin today, blood sugar is elevated. History of HIV, reports compliant with therapy and labs reported to be "good" (CD 270 09/16/18). No other complaints or concerns.      Past Medical History:  Diagnosis Date  . A-fib ([place])   . Asthma    No PFTs, history of childhood asthma  . CAD (coronary artery disease)    a. 06/2013 STEMI/PCI ([facility]): LAD w/ thrombus (treated with BMS), mid 75%, D2 75%; LCX OM2 75%; RCA small, PDA 95%, PLV 95%;  b. 10/2013 Cath/PCI: ISR w/in LAD (Promus DES x 2), borderline OM2 lesion;  c. 01/2014 MV: Intermediate risk, medium-sized distal ant wall infarct w/ very small amt of peri-infarct ischemia. EF 60%.  . Cellulitis 04/2014   left facial  . Chondromalacia of medial femoral condyle    Left knee MRI 04/28/12: Chondromalacia of the medial femoral condyle with slight peripheral degeneration of the meniscocapsular junction of the medial meniscus; followed by sports medicine  . Collagen vascular disease ([place])   . Crack cocaine use    for 20+ years, has been enrolled in detox programs in the past  . Depression    with history of hospitalization for suicidal ideation  . Diabetes mellitus 2002   Diagnosed in 2002, started insulin in  2012  . Gout   . Gout 04/28/2012  . Headache(784.0)    CT head 08/2011: Periventricular and subcortical white matter hypodensities are most in keeping with chronic microangiopathic change  . HIV infection [facility]) Nov 2012   Followed by [name]  . Hyperlipidemia   . Hypertension   . Pulmonary embolism [facility])     Patient Active Problem List   Diagnosis Date Noted  . Malnutrition of moderate degree 09/21/2018  . MDD (major depressive disorder), recurrent episode, moderate ([place])   . Type II diabetes mellitus with renal manifestations ([place]) 05/04/2018  . GERD (gastroesophageal reflux disease) 05/04/2018  . S/P CABG (coronary artery bypass graft)   . Left testicular pain   . Depression 02/20/2018  . Epididymo-orchitis, acute 02/20/2018  . Essential hypertension 02/20/2018  . Precordial chest pain 02/20/2018  . AKI (acute kidney injury) ([place]) 02/14/2018  . HCAP (healthcare-associated pneumonia) 02/14/2018  . History of pulmonary embolism 07/04/2017  . Acute hyponatremia 05/11/2017  . Hyperglycemia due to type 2 diabetes mellitus ([place]) 05/11/2017  . CHF (congestive heart failure) ([place]) 04/01/2017  . Hepatitis C 12/30/2016  . Chronic ischemic heart disease 11/12/2016  . Paroxysmal A-fib ([place]) 11/12/2016  . Back pain 04/18/2016  . S/P carotid endarterectomy 11/15/2015  . MDD (major depressive disorder), recurrent severe, without psychosis ([place]) 09/09/2015  . Cocaine-induced mood disorder ([place]) 08/14/2015  . Cocaine abuse with cocaine-induced mood disorder ([place]) 08/14/2015  . Gout  07/10/2015  . Acute renal failure superimposed on stage 3 chronic kidney disease ([place]) 03/06/2015  . Normocytic anemia 03/06/2015  . Chronic kidney disease, stage III (moderate) ([place]) 03/06/2015  . Hypoglycemia   . Encounter for general adult medical examination with abnormal findings 02/09/2015  . Cocaine use disorder, severe, dependence ([place]) 12/13/2014  . Substance or medication-induced depressive  disorder with onset during withdrawal ([place]) 12/13/2014  . Severe recurrent major depressive disorder with psychotic features ([place]) 12/12/2014  . Cervicalgia 06/28/2014  . Lumbar radiculopathy, chronic 06/28/2014  . Asthma, chronic 02/03/2014  . 3-vessel CAD 06/24/2013  . ED (erectile dysfunction) of organic origin 07/07/2012  . Hypertension goal BP (blood pressure) < 140/80 04/29/2012  . Chondromalacia of left knee 03/19/2012  . Hyperlipidemia with target LDL less than 100 02/12/2012  . Fibromyalgia 02/12/2012  . HIV (human immunodeficiency virus infection) ([place]) 08/27/2011  . Uncontrolled type 2 diabetes with neuropathy ([place]) 10/17/2000    Past Surgical History:  Procedure Laterality Date  . BACK SURGERY     1988  . BOWEL RESECTION    . CARDIAC SURGERY    . CERVICAL SPINE SURGERY     " rods in my neck "  . CORONARY ARTERY BYPASS GRAFT    . CORONARY STENT PLACEMENT    . NM MYOCAR PERF WALL MOTION  12/27/2011   normal  . SPINE SURGERY          Home Medications    Prior to Admission medications   Medication Sig Start Date End Date Taking? Authorizing Provider  albuterol (PROAIR HFA) 108 (90 Base) MCG/ACT inhaler INHALE TWO PUFFS INTO THE LUNGS EVERY 6 (SIX) HOURS AS NEEDED FOR WHEEZING OR SHORTNESS OF BREATH. Patient taking differently: Inhale 2 puffs into the lungs every 6 (six) hours as needed for wheezing or shortness of breath.  11/24/17  Yes Lindell Spar I, NP  allopurinol (ZYLOPRIM) 300 MG tablet Take 1 tablet (300 mg total) by mouth daily. For gout. 11/25/17  Yes Lindell Spar I, NP  aspirin 81 MG chewable tablet Chew 81 mg by mouth daily.   Yes [provider]  benzonatate (TESSALON) 100 MG capsule Take 1 capsule (100 mg total) by mouth every 8 (eight) hours. 09/29/18  Yes Tegeler, [name], MD  emtricitabine-rilpivir-tenofovir AF (ODEFSEY) 200-25-25 MG TABS tablet Take 1 tablet by mouth daily with breakfast. For HIV infection 11/24/17  Yes Nwoko, Herbert Pun I, NP    Fluticasone Propionate, Inhal, (FLOVENT DISKUS) 100 MCG/BLIST AEPB Inhale 1 puff into the lungs 2 (two) times daily.   Yes [provider]  gabapentin (NEURONTIN) 400 MG capsule Take 1 capsule (400 mg total) by mouth 2 (two) times daily. For agitation/neuropathic pain Patient taking differently: Take 400 mg by mouth 2 (two) times daily as needed (For agitation/neuropathic pain).  11/24/17  Yes Lindell Spar I, NP  insulin glargine (LANTUS) 100 UNIT/ML injection Inject 0.3 mLs (30 Units total) into the skin at bedtime. For diabetes management 08/04/18  Yes [name], MD  insulin lispro (HUMALOG) 100 UNIT/ML injection Inject 0.05 mLs (5 Units total) into the skin 3 (three) times daily with meals. For diabetes management Patient taking differently: Inject 4 Units into the skin See admin instructions. Inject 4 units into the skin three times a day with meals as needed if BGL is >130-140 11/24/17  Yes Nwoko, Herbert Pun I, NP  sertraline (ZOLOFT) 50 MG tablet Take 1 tablet (50 mg total) by mouth daily. 09/22/18  Yes Lady Deutscher, MD  traZODone (DESYREL) 50 MG tablet Take 1 tablet (50 mg total) by mouth at bedtime. Patient taking differently: Take 50 mg by mouth at bedtime as needed for sleep.  09/21/18  Yes Lady Deutscher, MD  ACCU-CHEK SOFTCLIX LANCETS lancets USE TO CHECK BLOOD GLUCOSE LEVELS UP TO 4 TIMES DAILY AS DIRECTED 11/24/17   Lindell Spar I, NP  hydrOXYzine (ATARAX/VISTARIL) 25 MG tablet Take 1 tablet (25 mg total) by mouth 3 (three) times daily as needed for anxiety. Patient not taking: Reported on 10/02/2018 11/24/17   Lindell Spar I, NP  Insulin Syringe-Needle U-100 (B-D INS SYR MICROFINE 1CC/27G) 27G X 5/8" 1 ML MISC 1 strip by Does not apply route 3 (three) times daily as needed. For blood sugar checks 11/24/17   Lindell Spar I, NP  metFORMIN (GLUCOPHAGE) 500 MG tablet Take 1 tablet (500 mg total) by mouth 2 (two) times daily with a meal. 08/04/18   [name], MD   predniSONE (DELTASONE) 50 MG tablet Take 1 tablet (50 mg total) by mouth daily for 4 days. 09/30/18 10/04/18  Tegeler, [name], MD    Family History Family History  Problem Relation Age of Onset  . Diabetes Mother   . Hypertension Mother   . Hyperlipidemia Mother   . Diabetes Father   . Cancer Father   . Hypertension Father   . Diabetes Brother   . Heart disease Brother   . Diabetes Sister   . Colon cancer Neg Hx     Social History Social History   Tobacco Use  . Smoking status: Never Smoker  . Smokeless tobacco: Never Used  Substance Use Topics  . Alcohol use: No    Alcohol/week: 4.0 standard drinks    Types: 2 Cans of beer, 2 Shots of liquor per week  . Drug use: Not Currently    Types: "Crack" cocaine, Cocaine    Comment: last use January 2018     Allergies   Patient has no known allergies.   Review of Systems Review of Systems  Constitutional: Positive for chills.  HENT: Positive for congestion.   Respiratory: Positive for cough, chest tightness, shortness of breath and wheezing.   Cardiovascular: Negative for chest pain.  Gastrointestinal: Negative for abdominal pain, nausea and vomiting.  Genitourinary: Negative for decreased urine volume and difficulty urinating.  Musculoskeletal: Negative for arthralgias and myalgias.  Skin: Negative for rash and wound.  Allergic/Immunologic: Positive for immunocompromised state.  Neurological: Negative for dizziness and weakness.  Hematological: Does not bruise/bleed easily.  Psychiatric/Behavioral: Negative for confusion.  All other systems reviewed and are negative.    Physical Exam Updated Vital Signs BP 122/87   Pulse 94   Temp 98.4 F (36.9 C) (Oral)   Resp (!) 26   Ht 5\' 8"  (1.727 m)   Wt 74.4 kg   SpO2 100%   BMI 24.94 kg/m   Physical Exam Vitals signs and nursing note reviewed.  Constitutional:      General: He is not in acute distress.    Appearance: He is well-developed. He is not  diaphoretic.  HENT:     Head: Normocephalic and atraumatic.     Mouth/Throat:     Mouth: Mucous membranes are moist.     Pharynx: No pharyngeal swelling or oropharyngeal exudate.  Neck:     Musculoskeletal: Neck supple.  Cardiovascular:     Rate and Rhythm: Regular rhythm. Tachycardia present.     Pulses: Normal pulses.     Heart sounds: Normal  heart sounds. No murmur.  Pulmonary:     Effort: Tachypnea present.     Breath sounds: No decreased breath sounds or wheezing.     Comments: Frequent harsh cough, nonproductive Musculoskeletal:     Right lower leg: He exhibits no tenderness. No edema.     Left lower leg: He exhibits no tenderness. No edema.  Lymphadenopathy:     Cervical: No cervical adenopathy.  Skin:    General: Skin is warm and dry.     Findings: No erythema or rash.  Neurological:     Mental Status: He is alert and oriented to person, place, and time.  Psychiatric:        Behavior: Behavior normal.      ED Treatments / Results  Labs (all labs ordered are listed, but only abnormal results are displayed) Labs Reviewed  BASIC METABOLIC PANEL - Abnormal; Notable for the following components:      Result Value   Glucose, Bld 485 (*)    Creatinine, Ser 1.90 (*)    Calcium 8.8 (*)    GFR calc non Af Amer 35 (*)    GFR calc Af Amer 41 (*)    All other components within normal limits  CBC WITH DIFFERENTIAL/PLATELET - Abnormal; Notable for the following components:   RBC 3.72 (*)    Hemoglobin 9.2 (*)    HCT 30.2 (*)    MCH 24.7 (*)    Platelets 129 (*)    All other components within normal limits  D-DIMER, QUANTITATIVE (NOT AT [facility]) - Abnormal; Notable for the following components:   D-Dimer, Quant 0.68 (*)    All other components within normal limits  CBG MONITORING, ED - Abnormal; Notable for the following components:   Glucose-Capillary 433 (*)    All other components within normal limits  CBG MONITORING, ED - Abnormal; Notable for the following  components:   Glucose-Capillary 343 (*)    All other components within normal limits  CBG MONITORING, ED - Abnormal; Notable for the following components:   Glucose-Capillary 163 (*)    All other components within normal limits  CBG MONITORING, ED - Abnormal; Notable for the following components:   Glucose-Capillary 130 (*)    All other components within normal limits  CULTURE, BLOOD (ROUTINE X 2)  CULTURE, BLOOD (ROUTINE X 2)  INFLUENZA PANEL BY PCR (TYPE A & B)  BLOOD GAS, VENOUS  TROPONIN I  TROPONIN I  TROPONIN I  VITAMIN B12  FOLATE  IRON AND TIBC  FERRITIN  RETICULOCYTES    EKG EKG Interpretation  Date/Time:  Friday October 02 2018 14:13:31 EST Ventricular Rate:  106 PR Interval:    QRS Duration: 85 QT Interval:  314 QTC Calculation: 417 R Axis:   64 Text Interpretation:  Sinus tachycardia Consider left atrial enlargement Borderline T wave abnormalities No significant change since last tracing Confirmed by Isla Pence [phone]) on 10/02/2018 4:03:18 PM   Radiology Dg Chest 2 View  Result Date: 10/02/2018 CLINICAL DATA:  Cough, fever and shortness of breath today. EXAM: CHEST - 2 VIEW COMPARISON:  PA and lateral chest 09/29/2018, 09/16/2018 and 02/20/2018. FINDINGS: Seven intact median sternotomy wires and atrial appendage clip are unchanged. Mild elevation of the right hemidiaphragm is also unchanged. Lungs are clear. Heart size is normal. No pneumothorax or pleural effusion. No acute or focal bony abnormality. IMPRESSION: No acute disease. Electronically Signed   By: Inge Rise M.D.   On: 10/02/2018 15:13   Ct Angio Chest  Pe W/cm &/or Wo Cm  Result Date: 10/02/2018 CLINICAL DATA:  Sore throat and cough since Monday. EXAM: CT ANGIOGRAPHY CHEST WITH CONTRAST TECHNIQUE: Multidetector CT imaging of the chest was performed using the standard protocol during bolus administration of intravenous contrast. Multiplanar CT image reconstructions and MIPs were obtained  to evaluate the vascular anatomy. CONTRAST:  164mL ISOVUE-370 IOPAMIDOL (ISOVUE-370) INJECTION 76% COMPARISON:  None. FINDINGS: Cardiovascular: Left atrial appendage clip. The patient is status post median sternotomy and CABG. Satisfactory opacification of the pulmonary arteries to the segmental level without acute pulmonary embolus. Nonaneurysmal minimally atherosclerotic aorta without dissection. Heart size is within normal limits. No pericardial effusion or thickening. Patent great vessels without aneurysm or dissection. Mediastinum/Nodes: No enlarged mediastinal, hilar, or axillary lymph nodes. Thyroid gland, trachea, and esophagus demonstrate no significant findings. Lungs/Pleura: Superior segment right lower lobe consolidation and atelectasis with air bronchograms suggested. No effusion or pneumothorax. Small patchy foci of airspace disease in the medial left lower lobe and anterior left upper lobe. Upper Abdomen: No acute abnormality. Musculoskeletal: ACDF of the included lower cervical spine. Thoracic spondylosis. Review of the MIP images confirms the above findings. IMPRESSION: 1. No acute pulmonary embolus. 2. Status post median sternotomy and CABG. 3. Superior segment right lower lobe consolidation and atelectasis. Small patchy foci of airspace disease in the left lower lobe and anterior left upper lobe. Findings are suspicious for multilobar pneumonia, greatest in the right lower lobe. Aortic Atherosclerosis (ICD10-I70.0). Electronically Signed   By: Ashley Royalty M.D.   On: 10/02/2018 17:54    Procedures Procedures (including critical care time)  Medications Ordered in ED Medications  ceFEPIme (MAXIPIME) 1 g in sodium chloride 0.9 % 100 mL IVPB (has no administration in time range)  vancomycin (VANCOCIN) 1,500 mg in sodium chloride 0.9 % 500 mL IVPB (has no administration in time range)  sodium chloride 0.9 % bolus 1,000 mL (0 mLs Intravenous Stopped 10/02/18 1641)  ipratropium-albuterol  (DUONEB) 0.5-2.5 (3) MG/3ML nebulizer solution 3 mL (3 mLs Nebulization Given 10/02/18 1540)  insulin aspart (novoLOG) injection 10 Units (10 Units Subcutaneous Given 10/02/18 1603)  iopamidol (ISOVUE-370) 76 % injection 100 mL (100 mLs Intravenous Contrast Given 10/02/18 1738)     Initial Impression / Assessment and Plan / ED Course  I have reviewed the triage vital signs and the nursing notes.  Pertinent labs & imaging results that were available during my care of the patient were reviewed by me and considered in my medical decision making (see chart for details).  Clinical Course as of Oct 02 1913  Fri Oct 02, 7077  [id number] 67 year old male with multiple medical problems including insulin-dependent diabetes, HIV, CKD presents with cough x 1 weeks, progressively worsening. On exam tachycardic and tachypneic with frequent harsh sounding nonproductive cough.  Lung sounds are clear, respiratory effort did not improve with neb treatment.  Patient with history of prior PE, not on anticoagulant, elevated d-dimer, CT chest ordered to evaluate for PE, negative for PE however found multilobar pneumonia.  Patient was started on antibiotics for hospital-acquired pneumonia due to recent hospitalization.  Blood sugar improved with insulin and IV fluids. Case discussed with Dr. Gilford Raid, ER attending who has seen the patient and agrees with plan for abx and consult to hospitalist for admission. Case discussed with hospitalist who will see the patient. Patient updated with plan of care, no further needs at this time.    [LM]    Clinical Course User Index [LM] Tacy Learn, PA-C  Final Clinical Impressions(s) / ED Diagnoses   Final diagnoses:  Pneumonia of both lungs due to infectious organism, unspecified part of lung  HCAP (healthcare-associated pneumonia)  HIV infection, unspecified symptom status [facility])    ED Discharge Orders    None       Roque Lias 10/02/18 1914    Isla Pence, MD 10/02/18 1918

## 2018-10-02 NOTE — ED Triage Notes (Signed)
Pt with onset of sore throat and cough on Monday. Seen and evaluated diagnosed with URI. States has been getting worse and he is now sob. cbg 584, 126/78, HR 100, 98% RA. 18g L hand.

## 2018-10-02 NOTE — ED Notes (Signed)
Pt transported to CT ?

## 2018-10-02 NOTE — Progress Notes (Addendum)
Pharmacy Antibiotic Note  Michael [name] is a 67 y.o. male admitted on 10/02/2018 with pneumonia.  Pharmacy has been consulted for Vancomycin dosing.  Pt also received one time dose of Cefepime in the ED. Continue cefepime in ED  Plan: Vancomycin 1500mg  IV loading dose then 1500 mg IV Q 36 hrs. Goal AUC 400-550. Expected AUC: 454 SCr used: 1.9 F/u micro data, pt's clinical condition, and renal function Cefepime 1gm q24 hours   Height: 5\' 8"  (172.7 cm) Weight: 164 lb (74.4 kg) IBW/kg (Calculated) : 68.4  Temp (24hrs), Avg:98.4 F (36.9 C), Min:98.4 F (36.9 C), Max:98.4 F (36.9 C)  Recent Labs  Lab 09/29/18 1546 10/02/18 1428  WBC 6.4 5.1  CREATININE 1.98* 1.90*    Estimated Creatinine Clearance: 36 mL/min (A) (by C-G formula based on SCr of 1.9 mg/dL (H)).    No Known Allergies  Antimicrobials this admission: 12/27 Cefepime>> 12/27 Vanc >>  Microbiology results: 12/27 BCx:    Thank you for allowing pharmacy to be a part of this patient's care.  Sherlon Handing, PharmD, BCPS Clinical pharmacist  **Pharmacist phone directory can now be found on [email or web address] ([email or web address]).  Listed under [place]. 10/02/2018 7:55 PM

## 2018-10-02 NOTE — ED Notes (Signed)
Pt c/o CP for the last 3 days. Hx CABG in may of this year.

## 2018-10-02 NOTE — H&P (Addendum)
Michael [name] FHL:[id number] DOB: 06-08-1951 DOA: 10/02/2018     PCP: [name], PA-C   Outpatient Specialists:   CARDS: not followed    Patient arrived to ER on 10/02/18 at 1404  Patient coming from: home Lives alone,     Homeless shelter Chief Complaint:  Chief Complaint  Patient presents with  . Shortness of Breath    HPI: Michael [name] is a 67 y.o. male with medical history significant of CAD S/P CABG, preserved EF as of July echo, HIV on HAART, ongoing cocaine abuse, HTN, DM2.  A. fib, depression, gout, HLD, history of PE,    Presented with   Persistent shortness of breath and bad cough.  Patient endorsing worsening chest pain worse with cough increased dyspnea and increased dyspnea on exertion Difficulty completion of the sentence secondary to severe coughing spells and wheezing 2 weeks ago was seen in the emergency department shortness of breath he was seen on 11 December  for similar complaints of shortness of breath has been admitted to the hospital for acute bronchitis/asthma exacerbation associated with suicidal ideation in the setting of substance abuse and bipolar disorder while hospitalized psychiatry has evaluated him and recommended inpatient psychiatric facility. He was seen again for chest pain shortness of breath on 24 December Prescription for prednisone and Tessalon. Comes in back to emergency department today complaining of cough sore throat for the past 5 days has been getting worse with worsening shortness of breath his blood sugar was noted to be elevated 584 heart rate 100 satting 90% room air. Note patient recently has been on steroids and has had recurrent steroid treatment for persistent wheezing. Continue to complain of chest pain has been intermittent for the past 3 days. Although he has been intermittently denying chest pain patient is unsure if he has any fevers but may be had some chills last CD4 count 270 on 16 September 2018  states he is compliant with his home medications     While in ER: Blood glucose now down to 485 Creatinine elevated 1.9 hemoglobin 9.2 D-dimer slightly elevated X-ray was non- acute CT Angio of the chest was done: Showed no PE but superior segment right lower lobe pneumonia as well as some patchy airspace disease in the left lower lobe as well as anterior left lower lobe suspicious for multilobar pneumonia.  The following Work up has been ordered so far:  Orders Placed This Encounter  Procedures  . Blood culture (routine x 2)  . DG Chest 2 View  . CT Angio Chest PE W/Cm &/Or Wo Cm  . Basic metabolic panel  . CBC with Differential  . D-dimer, quantitative (not at [facility])  . Influenza panel by PCR (type A & B)  . Check temperature  . vancomycin per pharmacy consult  . Consult to hospitalist  . Droplet precaution  . CBG monitoring, ED  . CBG monitoring, ED  . CBG monitoring, ED  . EKG 12-Lead   Following Medications were ordered in ER: Medications  ceFEPIme (MAXIPIME) 1 g in sodium chloride 0.9 % 100 mL IVPB (has no administration in time range)  vancomycin (VANCOCIN) 1,500 mg in sodium chloride 0.9 % 500 mL IVPB (has no administration in time range)  sodium chloride 0.9 % bolus 1,000 mL (0 mLs Intravenous Stopped 10/02/18 1641)  ipratropium-albuterol (DUONEB) 0.5-2.5 (3) MG/3ML nebulizer solution 3 mL (3 mLs Nebulization Given 10/02/18 1540)  insulin aspart (novoLOG) injection 10 Units (10 Units Subcutaneous Given 10/02/18 1603)  iopamidol (ISOVUE-370) 76 % injection 100 mL (100 mLs Intravenous Contrast Given 10/02/18 1738)    Significant initial  Findings: Abnormal Labs Reviewed  BASIC METABOLIC PANEL - Abnormal; Notable for the following components:      Result Value   Glucose, Bld 485 (*)    Creatinine, Ser 1.90 (*)    Calcium 8.8 (*)    GFR calc non Af Amer 35 (*)    GFR calc Af Amer 41 (*)    All other components within normal limits  CBC WITH DIFFERENTIAL/PLATELET  - Abnormal; Notable for the following components:   RBC 3.72 (*)    Hemoglobin 9.2 (*)    HCT 30.2 (*)    MCH 24.7 (*)    Platelets 129 (*)    All other components within normal limits  D-DIMER, QUANTITATIVE (NOT AT [facility]) - Abnormal; Notable for the following components:   D-Dimer, Quant 0.68 (*)    All other components within normal limits  CBG MONITORING, ED - Abnormal; Notable for the following components:   Glucose-Capillary 433 (*)    All other components within normal limits  CBG MONITORING, ED - Abnormal; Notable for the following components:   Glucose-Capillary 343 (*)    All other components within normal limits  CBG MONITORING, ED - Abnormal; Notable for the following components:   Glucose-Capillary 163 (*)    All other components within normal limits     Lactic Acid, Venous    Component Value Date/Time   LATICACIDVEN 2.25 (HH) 03/06/2015 1810    Na 136 K 4.0  Latest BG 163  Cr  stable,   Lab Results  Component Value Date   CREATININE 1.90 (H) 10/02/2018   CREATININE 1.98 (H) 09/29/2018   CREATININE 1.48 (H) 09/19/2018      WBC  5.1  HG/HCT at  baseline see below but some what low     Component Value Date/Time   HGB 9.2 (L) 10/02/2018 1428   HGB 11.4 (L) 12/26/2016 1233   HCT 30.2 (L) 10/02/2018 1428   HCT 34.8 (L) 12/26/2016 1233    Troponin (Point of Care Test) No results for input(s): TROPIPOC in the last 72 hours.    BNP (last 3 results) Recent Labs    02/20/18 1318 05/04/18 0237  BNP 83.0 36.1    ProBNP (last 3 results) No results for input(s): PROBNP in the last 8760 hours.  VBG 7.416/37.1  Influenza negative   UA  not ordered    CTA no PE, Superior segment right lower lobe consolidation   CXR -  NON acute     ECG:  Personally reviewed by me showing: HR : 106 Rhythm:  Sinus tachycardia     no evidence of ischemic changes QTC 417     ED Triage Vitals  Enc Vitals Group     BP 10/02/18 1430 114/67     Pulse Rate  10/02/18 1430 (!) 104     Resp 10/02/18 1430 (!) 24     Temp 10/02/18 1544 98.4 F (36.9 C)     Temp Source 10/02/18 1544 Oral     SpO2 10/02/18 1430 98 %     Weight 10/02/18 1421 164 lb (74.4 kg)     Height 10/02/18 1421 5\' 8"  (1.727 m)     Head Circumference --      Peak Flow --      Pain Score 10/02/18 1402 3     Pain Loc --      Pain  Edu? --      Excl. in [place]? --   TMAX(24)@       Latest  Blood pressure 122/87, pulse 94, temperature 98.4 F (36.9 C), temperature source Oral, resp. rate (!) 26, height 5\' 8"  (1.727 m), weight 74.4 kg, SpO2 100 %.    Hospitalist was called for admission for HCAP   Review of Systems:    Pertinent positives include: Fevers, chills, fatigue shortness of breath at rest.  dyspnea on exertion productive cough,  Constitutional:  No weight loss, night sweats, , weight loss  HEENT:  No headaches, Difficulty swallowing,Tooth/dental problems,Sore throat,  No sneezing, itching, ear ache, nasal congestion, post nasal drip,  Cardio-vascular:  No chest pain, Orthopnea, PND, anasarca, dizziness, palpitations.no Bilateral lower extremity swelling  GI:  No heartburn, indigestion, abdominal pain, nausea, vomiting, diarrhea, change in bowel habits, loss of appetite, melena, blood in stool, hematemesis Resp:   No excess mucus, no  No non-productive cough, No coughing up of blood.No change in color of mucus.No wheezing. Skin:  no rash or lesions. No jaundice GU:  no dysuria, change in color of urine, no urgency or frequency. No straining to urinate.  No flank pain.  Musculoskeletal:  No joint pain or no joint swelling. No decreased range of motion. No back pain.  Psych:  No change in mood or affect. No depression or anxiety. No memory loss.  Neuro: no localizing neurological complaints, no tingling, no weakness, no double vision, no gait abnormality, no slurred speech, no confusion  All systems reviewed and apart from [place] all are negative  Past Medical  History:   Past Medical History:  Diagnosis Date  . A-fib ([place])   . Asthma    No PFTs, history of childhood asthma  . CAD (coronary artery disease)    a. 06/2013 STEMI/PCI ([facility]): LAD w/ thrombus (treated with BMS), mid 75%, D2 75%; LCX OM2 75%; RCA small, PDA 95%, PLV 95%;  b. 10/2013 Cath/PCI: ISR w/in LAD (Promus DES x 2), borderline OM2 lesion;  c. 01/2014 MV: Intermediate risk, medium-sized distal ant wall infarct w/ very small amt of peri-infarct ischemia. EF 60%.  . Cellulitis 04/2014   left facial  . Chondromalacia of medial femoral condyle    Left knee MRI 04/28/12: Chondromalacia of the medial femoral condyle with slight peripheral degeneration of the meniscocapsular junction of the medial meniscus; followed by sports medicine  . Collagen vascular disease ([place])   . Crack cocaine use    for 20+ years, has been enrolled in detox programs in the past  . Depression    with history of hospitalization for suicidal ideation  . Diabetes mellitus 2002   Diagnosed in 2002, started insulin in 2012  . Gout   . Gout 04/28/2012  . Headache(784.0)    CT head 08/2011: Periventricular and subcortical white matter hypodensities are most in keeping with chronic microangiopathic change  . HIV infection [facility]) Nov 2012   Followed by [name]  . Hyperlipidemia   . Hypertension   . Pulmonary embolism [facility])       Past Surgical History:  Procedure Laterality Date  . BACK SURGERY     1988  . BOWEL RESECTION    . CARDIAC SURGERY    . CERVICAL SPINE SURGERY     " rods in my neck "  . CORONARY ARTERY BYPASS GRAFT    . CORONARY STENT PLACEMENT    . NM MYOCAR PERF WALL MOTION  12/27/2011   normal  .  SPINE SURGERY      Social History:  Ambulatory  independently     reports that he has never smoked. He has never used smokeless tobacco. He reports previous drug use. Drugs: "Crack" cocaine and Cocaine. He reports that he does not drink alcohol.     Family History:   Family History  Problem  Relation Age of Onset  . Diabetes Mother   . Hypertension Mother   . Hyperlipidemia Mother   . Diabetes Father   . Cancer Father   . Hypertension Father   . Diabetes Brother   . Heart disease Brother   . Diabetes Sister   . Colon cancer Neg Hx     Allergies: No Known Allergies   Prior to Admission medications   Medication Sig Start Date End Date Taking? Authorizing Provider  albuterol (PROAIR HFA) 108 (90 Base) MCG/ACT inhaler INHALE TWO PUFFS INTO THE LUNGS EVERY 6 (SIX) HOURS AS NEEDED FOR WHEEZING OR SHORTNESS OF BREATH. Patient taking differently: Inhale 2 puffs into the lungs every 6 (six) hours as needed for wheezing or shortness of breath.  11/24/17  Yes Lindell Spar I, NP  allopurinol (ZYLOPRIM) 300 MG tablet Take 1 tablet (300 mg total) by mouth daily. For gout. 11/25/17  Yes Lindell Spar I, NP  aspirin 81 MG chewable tablet Chew 81 mg by mouth daily.   Yes [provider]  benzonatate (TESSALON) 100 MG capsule Take 1 capsule (100 mg total) by mouth every 8 (eight) hours. 09/29/18  Yes Tegeler, [name], MD  emtricitabine-rilpivir-tenofovir AF (ODEFSEY) 200-25-25 MG TABS tablet Take 1 tablet by mouth daily with breakfast. For HIV infection 11/24/17  Yes Nwoko, Herbert Pun I, NP  Fluticasone Propionate, Inhal, (FLOVENT DISKUS) 100 MCG/BLIST AEPB Inhale 1 puff into the lungs 2 (two) times daily.   Yes [provider]  gabapentin (NEURONTIN) 400 MG capsule Take 1 capsule (400 mg total) by mouth 2 (two) times daily. For agitation/neuropathic pain Patient taking differently: Take 400 mg by mouth 2 (two) times daily as needed (For agitation/neuropathic pain).  11/24/17  Yes Lindell Spar I, NP  insulin glargine (LANTUS) 100 UNIT/ML injection Inject 0.3 mLs (30 Units total) into the skin at bedtime. For diabetes management 08/04/18  Yes [name], MD  insulin lispro (HUMALOG) 100 UNIT/ML injection Inject 0.05 mLs (5 Units total) into the skin 3 (three) times daily  with meals. For diabetes management Patient taking differently: Inject 4 Units into the skin See admin instructions. Inject 4 units into the skin three times a day with meals as needed if BGL is >130-140 11/24/17  Yes Nwoko, Herbert Pun I, NP  sertraline (ZOLOFT) 50 MG tablet Take 1 tablet (50 mg total) by mouth daily. 09/22/18  Yes Lady Deutscher, MD  traZODone (DESYREL) 50 MG tablet Take 1 tablet (50 mg total) by mouth at bedtime. Patient taking differently: Take 50 mg by mouth at bedtime as needed for sleep.  09/21/18  Yes Lady Deutscher, MD  ACCU-CHEK SOFTCLIX LANCETS lancets USE TO CHECK BLOOD GLUCOSE LEVELS UP TO 4 TIMES DAILY AS DIRECTED 11/24/17   Lindell Spar I, NP  hydrOXYzine (ATARAX/VISTARIL) 25 MG tablet Take 1 tablet (25 mg total) by mouth 3 (three) times daily as needed for anxiety. Patient not taking: Reported on 10/02/2018 11/24/17   Lindell Spar I, NP  Insulin Syringe-Needle U-100 (B-D INS SYR MICROFINE 1CC/27G) 27G X 5/8" 1 ML MISC 1 strip by Does not apply route 3 (three) times daily as needed.  For blood sugar checks 11/24/17   Lindell Spar I, NP  metFORMIN (GLUCOPHAGE) 500 MG tablet Take 1 tablet (500 mg total) by mouth 2 (two) times daily with a meal. 08/04/18   [name], MD  predniSONE (DELTASONE) 50 MG tablet Take 1 tablet (50 mg total) by mouth daily for 4 days. 09/30/18 10/04/18  Tegeler, [name], MD   Physical Exam: Blood pressure 122/87, pulse 94, temperature 98.4 F (36.9 C), temperature source Oral, resp. rate (!) 26, height 5\' 8"  (1.727 m), weight 74.4 kg, SpO2 100 %. 1. General:  in No Acute distress   Chronically ill  -appearing 2. Psychological: Alert and   Oriented 3. Head/ENT:    Dry Mucous Membranes                          Head Non traumatic, neck supple                           Poor Dentition 4. SKIN:   decreased Skin turgor,  Skin clean Dry and intact no rash 5. Heart: rapid Regular rate and rhythm no Murmur, no Rub or gallop 6. Lungs: some  wheezes and crackles   7. Abdomen: Soft,   non-tender, Non distended bowel sounds present 8. Lower extremities: no clubbing, cyanosis, or  edema 9. Neurologically Grossly intact, moving all 4 extremities equally  10. MSK: Normal range of motion   LABS:     Recent Labs  Lab 09/29/18 1546 10/02/18 1428  WBC 6.4 5.1  NEUTROABS  --  3.3  HGB 10.4* 9.2*  HCT 33.3* 30.2*  MCV 81.6 81.2  PLT 150 751*   Basic Metabolic Panel: Recent Labs  Lab 09/29/18 1546 10/02/18 1428  NA 137 136  K 4.1 4.0  CL 103 104  CO2 23 25  GLUCOSE 305* 485*  BUN 22 20  CREATININE 1.98* 1.90*  CALCIUM 9.2 8.8*      Recent Labs  Lab 09/29/18 1555  AST 21  ALT 19  ALKPHOS 75  BILITOT 0.6  PROT 7.1  ALBUMIN 3.3*   Recent Labs  Lab 09/29/18 1555  LIPASE 44   No results for input(s): AMMONIA in the last 168 hours.    HbA1C: No results for input(s): HGBA1C in the last 72 hours. CBG: Recent Labs  Lab 10/02/18 1419 10/02/18 1553 10/02/18 1640  GLUCAP 433* 343* 163*      Urine analysis:    Component Value Date/Time   COLORURINE YELLOW 09/16/2018 2035   APPEARANCEUR CLEAR 09/16/2018 2035   LABSPEC 1.027 09/16/2018 2035   PHURINE 5.0 09/16/2018 2035   GLUCOSEU >=500 (A) 09/16/2018 2035   HGBUR SMALL (A) 09/16/2018 2035   BILIRUBINUR NEGATIVE 09/16/2018 2035   KETONESUR NEGATIVE 09/16/2018 2035   PROTEINUR 100 (A) 09/16/2018 2035   UROBILINOGEN 0.2 08/13/2015 1030   NITRITE NEGATIVE 09/16/2018 2035   LEUKOCYTESUR NEGATIVE 09/16/2018 2035       Cultures:    Component Value Date/Time   SDES URINE, RANDOM 02/21/2018 0222   SPECREQUEST NONE 02/21/2018 0222   CULT  02/21/2018 0222    NO GROWTH Performed at [facility], [place] [address] [place], [place] [address]    REPTSTATUS 02/22/2018 FINAL 02/21/2018 0222     Radiological Exams on Admission: Dg Chest 2 View  Result Date: 10/02/2018 CLINICAL DATA:  Cough, fever and shortness of breath today. EXAM: CHEST - 2  VIEW COMPARISON:  PA and lateral chest 09/29/2018, 09/16/2018 and 02/20/2018. FINDINGS: Seven intact median sternotomy wires and atrial appendage clip are unchanged. Mild elevation of the right hemidiaphragm is also unchanged. Lungs are clear. Heart size is normal. No pneumothorax or pleural effusion. No acute or focal bony abnormality. IMPRESSION: No acute disease. Electronically Signed   By: Inge Rise M.D.   On: 10/02/2018 15:13   Ct Angio Chest Pe W/cm &/or Wo Cm  Result Date: 10/02/2018 CLINICAL DATA:  Sore throat and cough since Monday. EXAM: CT ANGIOGRAPHY CHEST WITH CONTRAST TECHNIQUE: Multidetector CT imaging of the chest was performed using the standard protocol during bolus administration of intravenous contrast. Multiplanar CT image reconstructions and MIPs were obtained to evaluate the vascular anatomy. CONTRAST:  149mL ISOVUE-370 IOPAMIDOL (ISOVUE-370) INJECTION 76% COMPARISON:  None. FINDINGS: Cardiovascular: Left atrial appendage clip. The patient is status post median sternotomy and CABG. Satisfactory opacification of the pulmonary arteries to the segmental level without acute pulmonary embolus. Nonaneurysmal minimally atherosclerotic aorta without dissection. Heart size is within normal limits. No pericardial effusion or thickening. Patent great vessels without aneurysm or dissection. Mediastinum/Nodes: No enlarged mediastinal, hilar, or axillary lymph nodes. Thyroid gland, trachea, and esophagus demonstrate no significant findings. Lungs/Pleura: Superior segment right lower lobe consolidation and atelectasis with air bronchograms suggested. No effusion or pneumothorax. Small patchy foci of airspace disease in the medial left lower lobe and anterior left upper lobe. Upper Abdomen: No acute abnormality. Musculoskeletal: ACDF of the included lower cervical spine. Thoracic spondylosis. Review of the MIP images confirms the above findings. IMPRESSION: 1. No acute pulmonary embolus. 2. Status  post median sternotomy and CABG. 3. Superior segment right lower lobe consolidation and atelectasis. Small patchy foci of airspace disease in the left lower lobe and anterior left upper lobe. Findings are suspicious for multilobar pneumonia, greatest in the right lower lobe. Aortic Atherosclerosis (ICD10-I70.0). Electronically Signed   By: Ashley Royalty M.D.   On: 10/02/2018 17:54    Chart has been reviewed    Assessment/Plan  67 y.o. male with medical history significant of CAD S/P CABG, preserved EF as of July echo, HIV on HAART, ongoing cocaine abuse, HTN, DM2.  A. fib, depression, gout, HLD, history of PE,  Admitted for HCAP   Present on Admission: . HCAP (healthcare-associated pneumonia) -  - will admit for treatment of CAP will start on appropriate antibiotic coverage.   Obtain:  sputum cultures,                  Obtain respiratory panel                    influenza serologies - neg                 blood cultures                   strep pneumo UA antigen,                  Provide oxygen as needed.   . 3-vessel CAD -  - chronic, continue aspirin check Lipid panel and initiate statin  Not on  beta blocker given hx of cocaine   . Chronic kidney disease, stage III (moderate) ([name]) -avoid nephrotoxic medications . Cocaine abuse with cocaine-induced mood disorder ([place]) spoke at length regarding quitting patient states his last cocaine use was 2 months ago . Essential hypertension stable continue to monitor . History of pulmonary embolism -currently no longer on  anticoagulation . Hyperglycemia due to type 2 diabetes mellitus ([place]) -currently improved continue to monitor closely while patient on steroids . Hyperlipidemia with target LDL less than 100 -obtain lipid panel unclear why patient was not on statin . Paroxysmal A-fib ([name]) -currently appears to be in sinus rhythm.  Continue to monitor patient is no longer on anticoagulation unclear why perhaps it was isolated paroxysmal event  patient is to resume follow-up with cardiology  . Uncontrolled type 2 diabetes with neuropathy ([name]) -  - Order Sensitive SSI   - continue home insulin regimen   Lantus 30 units,  -  check TSH and HgA1C  - Hold by mouth medications   Chest pain persistent will monitor on telemetry most likely respiratory in nature Given increased work of breathing dyspnea check cardiac markers CTA negative for PE   Reactive airway disease acute exacerbation patient has responded well to steroids in the past will continue make sure has bronchodilators PRN.  HIV last CD4 count reportedly checked 1 month ago will resume home medications   Other plan as per orders.  DVT prophylaxis:    Lovenox     Code Status:  FULL CODE  as per patient  I had personally discussed CODE STATUS with patient      Family Communication:   Family not  at  Bedside    Disposition Plan:       To home once workup is complete and patient is stable                                      Social Work  consulted                  Diabetes coordinator                    Consults called: none   Admission status:    inpatient     Expect 2 midnight stay secondary to severity of patient's current illness including   hemodynamic instability despite optimal treatment (tachycardia  )   Severe lab/radiological abnormalities including:   RML PNA and extensive comorbidities including:   substance abuse   DM2      CAD    CKD    That are currently affecting medical management.  I expect  patient to be hospitalized for 2 midnights requiring inpatient medical care.  Patient is at high risk for adverse outcome (such as loss of life or disability) if not treated.  Indication for inpatient stay as follows:  Hemodynamic instability despite maximal medical therapy,       persistent chest pain despite medical management   Need for IV antibiotics, IV fluids,  , IV medications       Level of care           SDU tele indefinitely  please discontinue once patient no longer qualifies     Cait Locust 10/03/2018, 1:05 AM    [facility]  Pager [phone]   after 2 AM please page floor coverage PA If 7AM-7PM, please contact the day team taking care of the patient  [email or web address]  Password [email or web address]

## 2018-10-03 ENCOUNTER — Other Ambulatory Visit: Payer: Self-pay

## 2018-10-03 DIAGNOSIS — R079 Chest pain, unspecified: Secondary | ICD-10-CM | POA: Diagnosis present

## 2018-10-03 DIAGNOSIS — Z794 Long term (current) use of insulin: Secondary | ICD-10-CM

## 2018-10-03 LAB — PROTIME-INR
INR: 0.97
Prothrombin Time: 12.8 seconds (ref 11.4–15.2)

## 2018-10-03 LAB — COMPREHENSIVE METABOLIC PANEL
ALK PHOS: 61 U/L (ref 38–126)
ALT: 23 U/L (ref 0–44)
AST: 18 U/L (ref 15–41)
Albumin: 2.8 g/dL — ABNORMAL LOW (ref 3.5–5.0)
Anion gap: 12 (ref 5–15)
BUN: 11 mg/dL (ref 8–23)
CALCIUM: 8 mg/dL — AB (ref 8.9–10.3)
CO2: 19 mmol/L — AB (ref 22–32)
Chloride: 110 mmol/L (ref 98–111)
Creatinine, Ser: 1.28 mg/dL — ABNORMAL HIGH (ref 0.61–1.24)
GFR calc Af Amer: >60 mL/min (ref >60)
GFR calc non Af Amer: 57 mL/min — ABNORMAL LOW (ref >60)
Glucose, Bld: 91 mg/dL (ref 70–99)
Potassium: 3.6 mmol/L (ref 3.5–5.1)
Sodium: 141 mmol/L (ref 135–145)
Total Bilirubin: 0.4 mg/dL (ref 0.3–1.2)
Total Protein: 5.7 g/dL — ABNORMAL LOW (ref 6.5–8.1)

## 2018-10-03 LAB — STREP PNEUMONIAE URINARY ANTIGEN: Strep Pneumo Urinary Antigen: NEGATIVE

## 2018-10-03 LAB — CBC
HCT: 29.6 % — ABNORMAL LOW (ref 39.0–52.0)
Hemoglobin: 9.6 g/dL — ABNORMAL LOW (ref 13.0–17.0)
MCH: 26.1 pg (ref 26.0–34.0)
MCHC: 32.4 g/dL (ref 30.0–36.0)
MCV: 80.4 fL (ref 80.0–100.0)
Platelets: DECREASED 10*3/uL (ref 150–400)
RBC: 3.68 MIL/uL — ABNORMAL LOW (ref 4.22–5.81)
RDW: 14 % (ref 11.5–15.5)
WBC: 3.8 10*3/uL — ABNORMAL LOW (ref 4.0–10.5)
nRBC: 0 % (ref 0.0–0.2)

## 2018-10-03 LAB — RESPIRATORY PANEL BY PCR

## 2018-10-03 LAB — PROCALCITONIN: Procalcitonin: <0.1 ng/mL

## 2018-10-03 LAB — APTT: aPTT: 27 seconds (ref 24–36)

## 2018-10-03 LAB — GLUCOSE, CAPILLARY
GLUCOSE-CAPILLARY: 78 mg/dL (ref 70–99)
Glucose-Capillary: 121 mg/dL — ABNORMAL HIGH (ref 70–99)
Glucose-Capillary: 266 mg/dL — ABNORMAL HIGH (ref 70–99)
Glucose-Capillary: 267 mg/dL — ABNORMAL HIGH (ref 70–99)
Glucose-Capillary: 305 mg/dL — ABNORMAL HIGH (ref 70–99)
Glucose-Capillary: 415 mg/dL — ABNORMAL HIGH (ref 70–99)

## 2018-10-03 LAB — PHOSPHORUS: Phosphorus: 3.1 mg/dL (ref 2.5–4.6)

## 2018-10-03 LAB — TSH: TSH: 2.14 u[IU]/mL (ref 0.350–4.500)

## 2018-10-03 LAB — TROPONIN I
Troponin I: <0.03 ng/mL (ref <0.03)
Troponin I: <0.03 ng/mL (ref <0.03)

## 2018-10-03 LAB — LACTIC ACID, PLASMA
Lactic Acid, Venous: 1.7 mmol/L (ref 0.5–1.9)
Lactic Acid, Venous: 1.3 mmol/L (ref 0.5–1.9)

## 2018-10-03 LAB — MAGNESIUM: Magnesium: 1.7 mg/dL (ref 1.7–2.4)

## 2018-10-03 LAB — HEMOGLOBIN A1C
Hgb A1c MFr Bld: 12.5 % — ABNORMAL HIGH (ref 4.8–5.6)
Mean Plasma Glucose: 312.05 mg/dL

## 2018-10-03 LAB — MRSA PCR SCREENING: MRSA by PCR: NEGATIVE

## 2018-10-03 MED ORDER — GUAIFENESIN ER 600 MG PO TB12
600.0000 mg | ORAL_TABLET | Freq: Two times a day (BID) | ORAL | Status: DC
  Administered 2018-10-03 – 2018-10-07 (×10): 600 mg via ORAL
  Filled 2018-10-03 (×10): qty 1

## 2018-10-03 MED ORDER — SERTRALINE HCL 50 MG PO TABS
50.0000 mg | ORAL_TABLET | Freq: Every day | ORAL | Status: DC
  Administered 2018-10-03 – 2018-10-07 (×5): 50 mg via ORAL
  Filled 2018-10-03 (×5): qty 1

## 2018-10-03 MED ORDER — ONDANSETRON HCL 4 MG/2ML IJ SOLN: 4.0000 mg | Freq: Four times a day (QID) | INTRAMUSCULAR | Status: DC | PRN

## 2018-10-03 MED ORDER — ENOXAPARIN SODIUM 30 MG/0.3ML ~~LOC~~ SOLN: 30.0000 mg | SUBCUTANEOUS | Status: DC

## 2018-10-03 MED ORDER — GABAPENTIN 400 MG PO CAPS: 400.0000 mg | ORAL_CAPSULE | Freq: Two times a day (BID) | ORAL | Status: DC | PRN

## 2018-10-03 MED ORDER — INSULIN GLARGINE 100 UNIT/ML ~~LOC~~ SOLN
30.0000 [IU] | Freq: Every day | SUBCUTANEOUS | Status: DC
  Administered 2018-10-03 (×2): 30 [IU] via SUBCUTANEOUS
  Filled 2018-10-03 (×2): qty 0.3

## 2018-10-03 MED ORDER — GLUCERNA SHAKE PO LIQD
237.0000 mL | Freq: Three times a day (TID) | ORAL | Status: DC
  Administered 2018-10-03 – 2018-10-07 (×12): 237 mL via ORAL

## 2018-10-03 MED ORDER — EMTRICITAB-RILPIVIR-TENOFOV AF 200-25-25 MG PO TABS
1.0000 | ORAL_TABLET | Freq: Every day | ORAL | Status: DC
  Administered 2018-10-03 – 2018-10-07 (×5): 1 via ORAL
  Filled 2018-10-03 (×5): qty 1

## 2018-10-03 MED ORDER — ACETAMINOPHEN 325 MG PO TABS: 650.0000 mg | ORAL_TABLET | Freq: Four times a day (QID) | ORAL | Status: DC | PRN

## 2018-10-03 MED ORDER — TRAZODONE HCL 50 MG PO TABS: 50.0000 mg | ORAL_TABLET | Freq: Every evening | ORAL | Status: DC | PRN

## 2018-10-03 MED ORDER — LEVALBUTEROL HCL 0.63 MG/3ML IN NEBU: 0.6300 mg | INHALATION_SOLUTION | Freq: Four times a day (QID) | RESPIRATORY_TRACT | Status: DC | PRN

## 2018-10-03 MED ORDER — ACETAMINOPHEN 650 MG RE SUPP: 650.0000 mg | Freq: Four times a day (QID) | RECTAL | Status: DC | PRN

## 2018-10-03 MED ORDER — PREDNISONE 20 MG PO TABS
40.0000 mg | ORAL_TABLET | Freq: Every day | ORAL | Status: DC
  Administered 2018-10-03 – 2018-10-05 (×3): 40 mg via ORAL
  Filled 2018-10-03 (×3): qty 2

## 2018-10-03 MED ORDER — ONDANSETRON HCL 4 MG PO TABS: 4.0000 mg | ORAL_TABLET | Freq: Four times a day (QID) | ORAL | Status: DC | PRN

## 2018-10-03 MED ORDER — IPRATROPIUM-ALBUTEROL 0.5-2.5 (3) MG/3ML IN SOLN
3.0000 mL | Freq: Three times a day (TID) | RESPIRATORY_TRACT | Status: DC
  Administered 2018-10-04 – 2018-10-07 (×11): 3 mL via RESPIRATORY_TRACT
  Filled 2018-10-03 (×10): qty 3

## 2018-10-03 MED ORDER — SODIUM CHLORIDE 0.9 % IV SOLN
1.0000 g | INTRAVENOUS | Status: DC
  Administered 2018-10-03 – 2018-10-04 (×2): 1 g via INTRAVENOUS
  Filled 2018-10-03 (×3): qty 1

## 2018-10-03 MED ORDER — INSULIN ASPART 100 UNIT/ML ~~LOC~~ SOLN
0.0000 [IU] | SUBCUTANEOUS | Status: DC
  Administered 2018-10-03: 5 [IU] via SUBCUTANEOUS
  Administered 2018-10-03: 1 [IU] via SUBCUTANEOUS
  Administered 2018-10-03: 15 [IU] via SUBCUTANEOUS
  Administered 2018-10-03: 7 [IU] via SUBCUTANEOUS
  Administered 2018-10-03 – 2018-10-04 (×2): 5 [IU] via SUBCUTANEOUS
  Administered 2018-10-04: 9 [IU] via SUBCUTANEOUS
  Administered 2018-10-04: 2 [IU] via SUBCUTANEOUS
  Administered 2018-10-04: 5 [IU] via SUBCUTANEOUS
  Administered 2018-10-05: 2 [IU] via SUBCUTANEOUS

## 2018-10-03 MED ORDER — ENOXAPARIN SODIUM 40 MG/0.4ML ~~LOC~~ SOLN
40.0000 mg | SUBCUTANEOUS | Status: DC
  Administered 2018-10-03 – 2018-10-06 (×4): 40 mg via SUBCUTANEOUS
  Filled 2018-10-03 (×4): qty 0.4

## 2018-10-03 MED ORDER — SODIUM CHLORIDE 0.9 % IV SOLN
INTRAVENOUS | Status: AC
  Administered 2018-10-03 (×2): via INTRAVENOUS

## 2018-10-03 MED ORDER — HYDROCODONE-ACETAMINOPHEN 5-325 MG PO TABS
1.0000 | ORAL_TABLET | ORAL | Status: DC | PRN
  Administered 2018-10-03 (×2): 2 via ORAL
  Administered 2018-10-03 – 2018-10-05 (×6): 1 via ORAL
  Filled 2018-10-03 (×5): qty 1
  Filled 2018-10-03 (×2): qty 2
  Filled 2018-10-03: qty 1

## 2018-10-03 MED ORDER — IPRATROPIUM-ALBUTEROL 0.5-2.5 (3) MG/3ML IN SOLN
3.0000 mL | Freq: Four times a day (QID) | RESPIRATORY_TRACT | Status: DC
  Administered 2018-10-03 (×3): 3 mL via RESPIRATORY_TRACT
  Filled 2018-10-03 (×5): qty 3

## 2018-10-03 MED ORDER — ASPIRIN 81 MG PO CHEW
81.0000 mg | CHEWABLE_TABLET | Freq: Every day | ORAL | Status: DC
  Administered 2018-10-03 – 2018-10-07 (×5): 81 mg via ORAL
  Filled 2018-10-03 (×5): qty 1

## 2018-10-03 MED ORDER — ADULT MULTIVITAMIN W/MINERALS CH
1.0000 | ORAL_TABLET | Freq: Every day | ORAL | Status: DC
  Administered 2018-10-03 – 2018-10-07 (×5): 1 via ORAL
  Filled 2018-10-03 (×5): qty 1

## 2018-10-03 NOTE — Clinical Social Work Note (Signed)
Clinical Social Work Assessment  Patient Details  Name: Michael [name] MRN: [id number] Date of Birth: [birth year]  Date of referral:  10/03/18               Reason for consult:  Substance Use/ETOH Abuse                Permission sought to share information with:    Permission granted to share information::  No  Name::        Agency::     Relationship::     Contact Information:     Housing/Transportation Living arrangements for the past 2 months:    Source of Information:  Patient, Medical Team Patient Interpreter Needed:  None Criminal Activity/Legal Involvement Pertinent to Current Situation/Hospitalization:  No - Comment as needed Significant Relationships:  Adult Children, Siblings Lives with:  Self Do you feel safe going back to the place where you live?  Yes Need for family participation in patient care:  Yes (Comment)  Care giving concerns:  Substance abuse.   Social Worker assessment / plan:  CSW met with patient. No supports at bedside. Patient notes not feeling well today. CSW introduced role and inquired about interest in substance abuse resources. Patient agreeable and confirmed he lives in [place]. CSW provided packet with inpatient and outpatient treatment center options for him to look into. No further concerns. CSW signing off as social work intervention is no longer needed.  Employment status:  Disabled (Comment on whether or not currently receiving Disability) Insurance information:  Managed Medicare PT Recommendations:  Not assessed at this time Information / Referral to community resources:  Residential Substance Abuse Treatment Options, Outpatient Substance Abuse Treatment Options  Patient/Family's Response to care:  Patient agreeable to receiving resources. Patient's son and siblings supportive and involved in patient's care. Patient appreciated social work intervention.  Patient/Family's Understanding of and Emotional Response to Diagnosis, Current  Treatment, and Prognosis:  Patient has a good understanding of the reason for admission and social work consult. Patient appears happy with hospital care.  Emotional Assessment Appearance:  Appears stated age Attitude/Demeanor/Rapport:  Engaged, Gracious Affect (typically observed):  Accepting, Appropriate, Calm, Pleasant Orientation:  Oriented to Self, Oriented to Place, Oriented to  Time, Oriented to Situation Alcohol / Substance use:  Illicit Drugs Psych involvement (Current and /or in the community):  No (Comment)  Discharge Needs  Concerns to be addressed:  Care Coordination Readmission within the last 30 days:  Yes Current discharge risk:  Substance Abuse Barriers to Discharge:  Continued Medical Work up   Candie Chroman, LCSW 10/03/2018, 9:09 AM

## 2018-10-03 NOTE — Clinical Social Work Note (Signed)
CSW acknowledges consult that patient has no PCP. Please consult RNCM.  Dayton Scrape, [place]

## 2018-10-03 NOTE — Evaluation (Signed)
Physical Therapy Evaluation Patient Details Name: Michael [name] MRN: [id number] DOB: [birth year] Today's Date: 10/03/2018   History of Present Illness  67 year old male with history of coronary artery disease with prior CABG, preserved EF on most recent echo, HIV on Elnoria Howard, ongoing cocaine abuse, diabetes mellitus, A. fib, history of PE who presents to the hospital with chief complaint of shortness of breath and productive cough.     Clinical Impression  Pt admitted with above diagnosis. Pt currently with functional limitations due to the deficits listed below (see PT Problem List). Pt drowsy upon entry, per RN states he was given pain medication 45 minutes ago. Reports he lives in shelter with 14 people, walks with [facility]. Today pt requires hands on assistance guarding while he ambulates. Limited eval, will re-attempt further progression next session anticipate he will progress to no PT follow up- however will update recs if appropriates. Pt will benefit from skilled PT to increase their independence and safety with mobility to allow discharge to the venue listed below.       Follow Up Recommendations Supervision for mobility/OOB;No PT follow up    Equipment Recommendations  Cane    Recommendations for Other Services       Precautions / Restrictions Precautions Precautions: Fall Precaution Comments: pt reports history of falls Restrictions Weight Bearing Restrictions: No      Mobility  Bed Mobility Overal bed mobility: Modified Independent                Transfers Overall transfer level: Modified independent Equipment used: 1 person hand held assist             General transfer comment: for safety and immediate standing balance  Ambulation/Gait Ambulation/Gait assistance: Min guard Gait Distance (Feet): 20 Feet Assistive device: None Gait Pattern/deviations: Step-through pattern;Decreased stride length Gait velocity: decrased   General Gait Details: Pt  unsteady, per RN given pain medication 45 minutes ago. not true sample of ambulation today.   Stairs            Wheelchair Mobility    Modified Rankin (Stroke Patients Only)       Balance                                             Pertinent Vitals/Pain Pain Assessment: Faces Faces Pain Scale: Hurts little more Pain Location: chest/ribs Pain Descriptors / Indicators: Sore Pain Intervention(s): Limited activity within patient's tolerance;Monitored during session    Home Living Family/patient expects to be discharged to:: Shelter/Homeless Living Arrangements: Group Home Available Help at Discharge: Family;Available PRN/intermittently Type of Home: Apartment Home Access: Level entry     Home Layout: One level Home Equipment: Cane - single point      Prior Function           Comments: pt reports living in a shelter wtih 14 other people, ambulates with [facility]     Hand Dominance   Dominant Hand: Right    Extremity/Trunk Assessment   Upper Extremity Assessment Upper Extremity Assessment: Overall WFL for tasks assessed    Lower Extremity Assessment Lower Extremity Assessment: Overall WFL for tasks assessed    Cervical / Trunk Assessment Cervical / Trunk Assessment: Normal  Communication   Communication: No difficulties  Cognition Arousal/Alertness: Awake/alert Behavior During Therapy: WFL for tasks assessed/performed Overall Cognitive Status: Within Functional Limits for tasks assessed  General Comments      Exercises     Assessment/Plan    PT Assessment Patient needs continued PT services  PT Problem List Decreased strength;Decreased activity tolerance;Decreased balance;Decreased mobility;Decreased safety awareness       PT Treatment Interventions DME instruction;Gait training;Stair training;Functional mobility training;Therapeutic activities;Therapeutic exercise;Balance  training;Patient/family education    PT Goals (Current goals can be found in the Care Plan section)  Acute Rehab PT Goals Patient Stated Goal: get better PT Goal Formulation: With patient Time For Goal Achievement: 10/17/18 Potential to Achieve Goals: Good    Frequency Min 3X/week   Barriers to discharge Decreased caregiver support      Co-evaluation               AM-PAC PT "6 Clicks" Mobility  Outcome Measure Help needed turning from your back to your side while in a flat bed without using bedrails?: None Help needed moving from lying on your back to sitting on the side of a flat bed without using bedrails?: None Help needed moving to and from a bed to a chair (including a wheelchair)?: A Little Help needed standing up from a chair using your arms (e.g., wheelchair or bedside chair)?: A Little Help needed to walk in hospital room?: A Little Help needed climbing 3-5 steps with a railing? : A Lot 6 Click Score: 19    End of Session Equipment Utilized During Treatment: Gait belt Activity Tolerance: Patient limited by lethargy Patient left: with call bell/phone within reach;in chair;with nursing/sitter in room Nurse Communication: Mobility status PT Visit Diagnosis: Unsteadiness on feet (R26.81);Muscle weakness (generalized) (M62.81)    Time: 1705-1730 PT Time Calculation (min) (ACUTE ONLY): 25 min   Charges:   PT Evaluation $PT Eval Low Complexity: 1 Low PT Treatments $Gait Training: 8-22 mins        Reinaldo Berber, PT, DPT Acute Rehabilitation Services Pager: [phone] Office: [phone]    Reinaldo Berber 10/03/2018, 5:30 PM

## 2018-10-03 NOTE — Progress Notes (Signed)
Initial Nutrition Assessment  DOCUMENTATION CODES:   Non-severe (moderate) malnutrition in context of chronic illness  INTERVENTION:    Glucerna Shake po TID, each supplement provides 220 kcal and 10 grams of protein  MVI with minerals po daily  NUTRITION DIAGNOSIS:   Moderate Malnutrition related to chronic illness(ongoing cocaine abuse, DM, HIV) as evidenced by moderate fat depletion, moderate muscle depletion.  GOAL:   Patient will meet greater than or equal to 90% of their needs  MONITOR:   PO intake, Supplement acceptance, Labs, Skin, Weight trends, I & O's  REASON FOR ASSESSMENT:   Consult Assessment of nutrition requirement/status  ASSESSMENT:   67 y.o. Male with PMH significant of CAD S/P CABG, preserved EF as of July echo, HIV on HAART, ongoing cocaine abuse, HTN, DM2.  A. fib, depression, gout, HLD, history of PE; presented with shortness of breath.   Pt sleeping soundly upon RD visit. Did not wake. Pt assessed per RD 09/21/18. Identified with moderate malnutrition. Pt was eating well during most recent acute hospitalization.  No % PO intakes documented yet for this admission. Pt with hx of weight loss, however, not significant for time frame. Medications include Lantus, Zofran and ABX.  Labs reviewed. CO2 19 (L). CBG's 121-78-267.  NUTRITION - FOCUSED PHYSICAL EXAM:  Completed 09/21/18. Findings were mild to moderate muscle depletions and mild to moderate fat depletions.  Diet Order:   Diet Order            Diet Heart Room service appropriate? Yes; Fluid consistency: Thin  Diet effective now             EDUCATION NEEDS:   Not appropriate for education at this time  Skin:  Skin Assessment: Reviewed RN Assessment  Last BM:  12/26  Height:   Ht Readings from Last 1 Encounters:  10/03/18 5\' 8"  (1.727 m)    Weight:   Wt Readings from Last 1 Encounters:  10/03/18 79.1 kg   BMI:  Body mass index is 26.51 kg/m.  Estimated Nutritional  Needs:   Kcal:  2200-2400  Protein:  110-125 gm  Fluid:  2.2-2.4 L  Arthur Holms, RD, LDN Pager #: [phone] After-Hours Pager #: [phone]

## 2018-10-03 NOTE — Progress Notes (Signed)
PROGRESS NOTE  Michael [name] HFW:[id number] DOB: 30-Jan-1951 DOA: 10/02/2018 PCP: [name], PA-C   LOS: 1 day   Brief Narrative / Interim history: 67 year old male with history of coronary artery disease with prior CABG, preserved EF on most recent echo, HIV on Elnoria Howard, ongoing cocaine abuse, diabetes mellitus, A. fib, history of PE who presents to the hospital with chief complaint of shortness of breath and productive cough.  He has been having worsening dyspnea on exertion on and off for the past couple of weeks, progressively getting worse, not to the point that he cannot take few steps before having to stop.  Subjective: -Feeling slightly improved compared to last night however still remains short of breath, complains of a productive cough as well as dyspneic with minimal activities.  Assessment & Plan: Active Problems:   Uncontrolled type 2 diabetes with neuropathy ([name])   Hyperlipidemia with target LDL less than 100   3-vessel CAD   Cocaine abuse with cocaine-induced mood disorder ([place])   HCAP (healthcare-associated pneumonia)   Essential hypertension   Chronic kidney disease, stage III (moderate) ([name])   History of pulmonary embolism   Hyperglycemia due to type 2 diabetes mellitus ([name])   Paroxysmal A-fib ([place])   Chest pain   Principal Problem Healthcare associated pneumonia -Patient admitted to the hospital and started on vancomycin, cefepime, continue -Monitor cultures -Respiratory virus panel negative for flu however ended up positive for metapneumovirus, also continue supportive treatment -Remains quite symptomatic this morning, dyspneic and tachypneic  Additional Problems Coronary artery disease with history of CABG -No chest pain this morning, troponin negative, continue aspirin, narrow beta-blockers due to active ongoing cocaine use  Chronic kidney disease stage III -Baseline creatinine 1.3-1.7, 1.9 on admission last night improved to 1.3 this  morning, varying within expected baseline and overall stable  HIV -Outpatient management, continue home medications.  Most recent CD4 count at the beginning of this month was 270  Insulin-dependent diabetes mellitus -Continue Lantus and sliding scale  Reactive airway disease -Wheezing on admission, started on prednisone, will taper off rapidly.  Still some wheezing this morning  Paroxysmal A. fib -Currently in sinus, does not appear to be on anticoagulation, defer to outpatient management?  Whether his A. fib was an isolated event  History of PE -Finished anticoagulation  Hypertension -Blood pressure within normal limits, continue to monitor   Scheduled Meds: . aspirin  81 mg Oral Daily  . emtricitabine-rilpivir-tenofovir AF  1 tablet Oral Q breakfast  . enoxaparin (LOVENOX) injection  40 mg Subcutaneous Q24H  . guaiFENesin  600 mg Oral BID  . insulin aspart  0-9 Units Subcutaneous Q4H  . insulin glargine  30 Units Subcutaneous QHS  . ipratropium-albuterol  3 mL Nebulization Q6H  . predniSONE  40 mg Oral Q breakfast  . sertraline  50 mg Oral Daily   Continuous Infusions: . ceFEPime (MAXIPIME) IV    . [START ON 10/04/2018] vancomycin     PRN Meds:.acetaminophen **OR** acetaminophen, gabapentin, HYDROcodone-acetaminophen, levalbuterol, ondansetron **OR** ondansetron (ZOFRAN) IV, traZODone  DVT prophylaxis: Lovenox Code Status: Full code Family Communication: No family present at bedside Disposition Plan: Home when ready  Consultants:   None  Procedures:   None   Antimicrobials:  Vancomycin 12/27 >>  Cefepime 12/27 >>  Objective: Vitals:   10/03/18 0400 10/03/18 0816 10/03/18 0831 10/03/18 1150  BP: (!) 129/93 106/76  114/72  Pulse: 81 72  88  Resp: 20 (!) 24  17  Temp: 99.6 F (37.6 C)  97.8 F (36.6 C)  97.9 F (36.6 C)  TempSrc: Oral Oral  Oral  SpO2: 100% 100% 100% 97%  Weight:      Height:        Intake/Output Summary (Last 24 hours) at  10/03/2018 1318 Last data filed at 10/03/2018 0500 Gross per 24 hour  Intake 400 ml  Output 375 ml  Net 25 ml   Filed Weights   10/02/18 1421 10/03/18 0015  Weight: 74.4 kg 79.1 kg    Examination:  Constitutional: Increased work of breathing apparent, coughing with every other breath Eyes: PERRL, lids and conjunctivae normal ENMT: Mucous membranes are moist. Neck: normal, supple Respiratory: End expiratory wheezing, no crackles, increased respiratory effort, tachypneic Cardiovascular: Regular rate and rhythm, no murmurs / rubs / gallops. No LE edema. 2+ pedal pulses.  Abdomen: no tenderness.  Musculoskeletal: no clubbing / cyanosis. Skin: no rashes Neurologic: CN 2-12 grossly intact. Strength 5/5 in all 4.  Psychiatric: Normal judgment and insight. Alert and oriented x 3. Normal mood.    Data Reviewed: I have independently reviewed following labs and imaging studies    CT chest personally reviewed, findings consistent with multifocal pneumonia with infiltrates in the right and left lower lobes  CBC: Recent Labs  Lab 09/29/18 1546 10/02/18 1428 10/03/18 0750  WBC 6.4 5.1 3.8*  NEUTROABS  --  3.3  --   HGB 10.4* 9.2* 9.6*  HCT 33.3* 30.2* 29.6*  MCV 81.6 81.2 80.4  PLT 150 129* PLATELET CLUMPS NOTED ON SMEAR, COUNT APPEARS DECREASED   Basic Metabolic Panel: Recent Labs  Lab 09/29/18 1546 10/02/18 1428 10/03/18 0750  NA 137 136 141  K 4.1 4.0 3.6  CL 103 104 110  CO2 23 25 19*  GLUCOSE 305* 485* 91  BUN 22 20 11   CREATININE 1.98* 1.90* 1.28*  CALCIUM 9.2 8.8* 8.0*  MG  --   --  1.7  PHOS  --   --  3.1   GFR: Estimated Creatinine Clearance: 53.4 mL/min (A) (by C-G formula based on SCr of 1.28 mg/dL (H)). Liver Function Tests: Recent Labs  Lab 09/29/18 1555 10/03/18 0750  AST 21 18  ALT 19 23  ALKPHOS 75 61  BILITOT 0.6 0.4  PROT 7.1 5.7*  ALBUMIN 3.3* 2.8*   Recent Labs  Lab 09/29/18 1555  LIPASE 44   No results for input(s): AMMONIA in the  last 168 hours. Coagulation Profile: Recent Labs  Lab 10/03/18 0137  INR 0.97   Cardiac Enzymes: Recent Labs  Lab 10/02/18 2005 10/03/18 0137 10/03/18 0750  TROPONINI <0.03 <0.03 <0.03   BNP (last 3 results) No results for input(s): PROBNP in the last 8760 hours. HbA1C: Recent Labs    10/03/18 0137  HGBA1C 12.5*   CBG: Recent Labs  Lab 10/02/18 1858 10/03/18 0052 10/03/18 0357 10/03/18 0821 10/03/18 1149  GLUCAP 130* 266* 121* 78 267*   Lipid Profile: No results for input(s): CHOL, HDL, LDLCALC, TRIG, CHOLHDL, LDLDIRECT in the last 72 hours. Thyroid Function Tests: Recent Labs    10/03/18 0137  TSH 2.140   Anemia Panel: Recent Labs    10/02/18 2005  VITAMINB12 298  FOLATE 10.9  FERRITIN 28  TIBC 335  IRON 21*  RETICCTPCT 1.5   Urine analysis:    Component Value Date/Time   COLORURINE YELLOW 09/16/2018 2035   APPEARANCEUR CLEAR 09/16/2018 2035   LABSPEC 1.027 09/16/2018 2035   PHURINE 5.0 09/16/2018 2035   GLUCOSEU >=500 (A) 09/16/2018 2035  HGBUR SMALL (A) 09/16/2018 2035   BILIRUBINUR NEGATIVE 09/16/2018 2035   KETONESUR NEGATIVE 09/16/2018 2035   PROTEINUR 100 (A) 09/16/2018 2035   UROBILINOGEN 0.2 08/13/2015 1030   NITRITE NEGATIVE 09/16/2018 2035   LEUKOCYTESUR NEGATIVE 09/16/2018 2035   Sepsis Labs: Invalid input(s): PROCALCITONIN, LACTICIDVEN  Recent Results (from the past 240 hour(s))  Blood culture (routine x 2)     Status: None (Preliminary result)   Collection Time: 10/02/18  7:00 PM  Result Value Ref Range Status   Specimen Description BLOOD RIGHT ANTECUBITAL  Final   Special Requests   Final    BOTTLES DRAWN AEROBIC AND ANAEROBIC Blood Culture adequate volume   Culture   Final    NO GROWTH < 24 HOURS Performed at [facility], [address] [place], [place] [address]    Report Status PENDING  Incomplete  Blood culture (routine x 2)     Status: None (Preliminary result)   Collection Time: 10/02/18 10:12 PM    Result Value Ref Range Status   Specimen Description BLOOD RIGHT WRIST  Final   Special Requests   Final    BOTTLES DRAWN AEROBIC AND ANAEROBIC Blood Culture results may not be optimal due to an inadequate volume of blood received in culture bottles   Culture   Final    NO GROWTH < 24 HOURS Performed at [name], [place] [address] [place], [place] [address]    Report Status PENDING  Incomplete  Respiratory Panel by PCR     Status: Abnormal   Collection Time: 10/03/18 12:42 AM  Result Value Ref Range Status   Adenovirus NOT DETECTED NOT DETECTED Final   Coronavirus 229E NOT DETECTED NOT DETECTED Final   Coronavirus HKU1 NOT DETECTED NOT DETECTED Final   Coronavirus NL63 NOT DETECTED NOT DETECTED Final   Coronavirus OC43 NOT DETECTED NOT DETECTED Final   Metapneumovirus DETECTED (A) NOT DETECTED Final   Rhinovirus / Enterovirus NOT DETECTED NOT DETECTED Final   Influenza A NOT DETECTED NOT DETECTED Final   Influenza B NOT DETECTED NOT DETECTED Final   Parainfluenza Virus 1 NOT DETECTED NOT DETECTED Final   Parainfluenza Virus 2 NOT DETECTED NOT DETECTED Final   Parainfluenza Virus 3 NOT DETECTED NOT DETECTED Final   Parainfluenza Virus 4 NOT DETECTED NOT DETECTED Final   Respiratory Syncytial Virus NOT DETECTED NOT DETECTED Final   Bordetella pertussis NOT DETECTED NOT DETECTED Final   Chlamydophila pneumoniae NOT DETECTED NOT DETECTED Final   Mycoplasma pneumoniae NOT DETECTED NOT DETECTED Final    Comment: Performed at [facility], [address] [place], [place] [address]  MRSA PCR Screening     Status: None   Collection Time: 10/03/18 12:42 AM  Result Value Ref Range Status   MRSA by PCR NEGATIVE NEGATIVE Final    Comment:        The GeneXpert MRSA Assay (FDA approved for NASAL specimens only), is one component of a comprehensive MRSA colonization surveillance program. It is not intended to diagnose MRSA infection nor to guide or monitor treatment  for MRSA infections. Performed at [facility], [place] [address], [place], [place] [address]       Radiology Studies: Dg Chest 2 View  Result Date: 10/02/2018 CLINICAL DATA:  Cough, fever and shortness of breath today. EXAM: CHEST - 2 VIEW COMPARISON:  PA and lateral chest 09/29/2018, 09/16/2018 and 02/20/2018. FINDINGS: Seven intact median sternotomy wires and atrial appendage clip are unchanged. Mild  elevation of the right hemidiaphragm is also unchanged. Lungs are clear. Heart size is normal. No pneumothorax or pleural effusion. No acute or focal bony abnormality. IMPRESSION: No acute disease. Electronically Signed   By: Inge Rise M.D.   On: 10/02/2018 15:13   Ct Angio Chest Pe W/cm &/or Wo Cm  Result Date: 10/02/2018 CLINICAL DATA:  Sore throat and cough since Monday. EXAM: CT ANGIOGRAPHY CHEST WITH CONTRAST TECHNIQUE: Multidetector CT imaging of the chest was performed using the standard protocol during bolus administration of intravenous contrast. Multiplanar CT image reconstructions and MIPs were obtained to evaluate the vascular anatomy. CONTRAST:  170mL ISOVUE-370 IOPAMIDOL (ISOVUE-370) INJECTION 76% COMPARISON:  None. FINDINGS: Cardiovascular: Left atrial appendage clip. The patient is status post median sternotomy and CABG. Satisfactory opacification of the pulmonary arteries to the segmental level without acute pulmonary embolus. Nonaneurysmal minimally atherosclerotic aorta without dissection. Heart size is within normal limits. No pericardial effusion or thickening. Patent great vessels without aneurysm or dissection. Mediastinum/Nodes: No enlarged mediastinal, hilar, or axillary lymph nodes. Thyroid gland, trachea, and esophagus demonstrate no significant findings. Lungs/Pleura: Superior segment right lower lobe consolidation and atelectasis with air bronchograms suggested. No effusion or pneumothorax. Small patchy foci of airspace disease in the medial left lower lobe and  anterior left upper lobe. Upper Abdomen: No acute abnormality. Musculoskeletal: ACDF of the included lower cervical spine. Thoracic spondylosis. Review of the MIP images confirms the above findings. IMPRESSION: 1. No acute pulmonary embolus. 2. Status post median sternotomy and CABG. 3. Superior segment right lower lobe consolidation and atelectasis. Small patchy foci of airspace disease in the left lower lobe and anterior left upper lobe. Findings are suspicious for multilobar pneumonia, greatest in the right lower lobe. Aortic Atherosclerosis (ICD10-I70.0). Electronically Signed   By: Ashley Royalty M.D.   On: 10/02/2018 17:54     Marzetta Board, MD, PhD [facility] Pager [phone]  If 7PM-7AM, please contact night-coverage [email or web address] Password [email or web address] 10/03/2018, 1:18 PM

## 2018-10-04 LAB — GLUCOSE, CAPILLARY
Glucose-Capillary: 171 mg/dL — ABNORMAL HIGH (ref 70–99)
Glucose-Capillary: 193 mg/dL — ABNORMAL HIGH (ref 70–99)
Glucose-Capillary: 269 mg/dL — ABNORMAL HIGH (ref 70–99)
Glucose-Capillary: 292 mg/dL — ABNORMAL HIGH (ref 70–99)
Glucose-Capillary: 364 mg/dL — ABNORMAL HIGH (ref 70–99)
Glucose-Capillary: 411 mg/dL — ABNORMAL HIGH (ref 70–99)
Glucose-Capillary: 454 mg/dL — ABNORMAL HIGH (ref 70–99)
Glucose-Capillary: 49 mg/dL — ABNORMAL LOW (ref 70–99)

## 2018-10-04 LAB — CBC
HCT: 27 % — ABNORMAL LOW (ref 39.0–52.0)
Hemoglobin: 8.6 g/dL — ABNORMAL LOW (ref 13.0–17.0)
MCH: 25.7 pg — ABNORMAL LOW (ref 26.0–34.0)
MCHC: 31.9 g/dL (ref 30.0–36.0)
MCV: 80.6 fL (ref 80.0–100.0)
Platelets: 101 10*3/uL — ABNORMAL LOW (ref 150–400)
RBC: 3.35 MIL/uL — ABNORMAL LOW (ref 4.22–5.81)
RDW: 14 % (ref 11.5–15.5)
WBC: 4.4 10*3/uL (ref 4.0–10.5)
nRBC: 0 % (ref 0.0–0.2)

## 2018-10-04 LAB — BASIC METABOLIC PANEL
Anion gap: 8 (ref 5–15)
BUN: 21 mg/dL (ref 8–23)
CO2: 23 mmol/L (ref 22–32)
Calcium: 8.2 mg/dL — ABNORMAL LOW (ref 8.9–10.3)
Chloride: 106 mmol/L (ref 98–111)
Creatinine, Ser: 1.53 mg/dL — ABNORMAL HIGH (ref 0.61–1.24)
GFR calc Af Amer: 53 mL/min — ABNORMAL LOW (ref >60)
GFR, EST NON AFRICAN AMERICAN: 46 mL/min — AB (ref >60)
Glucose, Bld: 245 mg/dL — ABNORMAL HIGH (ref 70–99)
POTASSIUM: 4 mmol/L (ref 3.5–5.1)
Sodium: 137 mmol/L (ref 135–145)

## 2018-10-04 LAB — GLUCOSE, RANDOM: Glucose, Bld: 500 mg/dL — ABNORMAL HIGH (ref 70–99)

## 2018-10-04 MED ORDER — BUDESONIDE 0.25 MG/2ML IN SUSP
0.2500 mg | Freq: Two times a day (BID) | RESPIRATORY_TRACT | Status: DC
  Administered 2018-10-04 – 2018-10-07 (×7): 0.25 mg via RESPIRATORY_TRACT
  Filled 2018-10-04 (×7): qty 2

## 2018-10-04 MED ORDER — INSULIN ASPART 100 UNIT/ML ~~LOC~~ SOLN
10.0000 [IU] | Freq: Once | SUBCUTANEOUS | Status: AC
  Administered 2018-10-04: 10 [IU] via SUBCUTANEOUS

## 2018-10-04 MED ORDER — INSULIN GLARGINE 100 UNIT/ML ~~LOC~~ SOLN
20.0000 [IU] | Freq: Every day | SUBCUTANEOUS | Status: DC
  Administered 2018-10-04: 20 [IU] via SUBCUTANEOUS
  Filled 2018-10-04: qty 0.2

## 2018-10-04 MED ORDER — DEXTROSE 50 % IV SOLN
INTRAVENOUS | Status: AC
  Administered 2018-10-04: 50 mL
  Filled 2018-10-04: qty 50

## 2018-10-04 NOTE — Progress Notes (Addendum)
CRITICAL VALUE ALERT  Critical Value:  Serum Glucose 500  Date & Time Notied:  Not notified, noticed value in  Provider Notified: [id number], [place]  Orders Received/Actions taken:  2215 orders received for 10 units of Novolog now.  Dose given.  Patient educated regarding infection and blood sugar as he was concerned regarding why it was so high.  We will recheck at midnight.  Patient denies any new signs or symptoms at this time.

## 2018-10-04 NOTE — Progress Notes (Signed)
CRITICAL VALUE ALERT  Critical Value:  CBG 454  Date & Time Notied: 2047  Provider Notified: Provider [place]  Orders Received/Actions taken: Stat Random Glucose ordered.

## 2018-10-04 NOTE — Progress Notes (Signed)
Inpatient Diabetes Program Recommendations  AACE/ADA: New Consensus Statement on Inpatient Glycemic Control (2015)  Target Ranges:  Prepandial:   less than 140 mg/dL      Peak postprandial:   less than 180 mg/dL (1-2 hours)      Critically ill patients:  140 - 180 mg/dL   Lab Results  Component Value Date   GLUCAP 364 (H) 10/04/2018   HGBA1C 12.5 (H) 10/03/2018    Review of Glycemic Control  Diabetes history: DM2 Outpatient Diabetes medications: Lantus 30 units QHS, Humalog 4 units tidwc, metformin 500 bid Current orders for Inpatient glycemic control: Lantus 20 units QHS, Novolog 0-9 units Q4H On Prednisone 40 mg QD  HgbA1C - 12.5% - uncontrolled Post-prandials elevated - needs meal coverage insulin. Eating 90-100% meals.  Inpatient Diabetes Program Recommendations:     Add Novolog 5 units tidwc for meal coverage insulin if pt eats > 50% meal. If FBS > 180 mg/dL, increase Lantus to 25 units QHS.  Diabetes Coordinator to speak with pt regarding his HgbA1C of 12.5% on 12/30 am.  Will continue to follow. [facility] following also.  Thank you. Lorenda Peck, RD, LDN, CDE Inpatient Diabetes Coordinator [phone]

## 2018-10-04 NOTE — Progress Notes (Signed)
PROGRESS NOTE  Michael [name] YTK:[id number] DOB: Jul 05, 1951 DOA: 10/02/2018 PCP: [name], PA-C   LOS: 2 days   Brief Narrative / Interim history: 67 year old male with history of coronary artery disease with prior CABG, preserved EF on most recent echo, HIV on Elnoria Howard, ongoing cocaine abuse, diabetes mellitus, A. fib, history of PE who presents to the hospital with chief complaint of shortness of breath and productive cough.  He has been having worsening dyspnea on exertion on and off for the past couple of weeks, progressively getting worse, not to the point that he cannot take few steps before having to stop.  Subjective: -Had couple coughing spells this morning got really short of breath.  Bringing up phlegm.  Complains of chills this morning.  He denies any chest pain, no abdominal pain, nausea vomiting  Assessment & Plan: Active Problems:   Uncontrolled type 2 diabetes with neuropathy ([name])   Hyperlipidemia with target LDL less than 100   3-vessel CAD   Cocaine abuse with cocaine-induced mood disorder ([place])   HCAP (healthcare-associated pneumonia)   Essential hypertension   Chronic kidney disease, stage III (moderate) ([name])   History of pulmonary embolism   Hyperglycemia due to type 2 diabetes mellitus ([name])   Paroxysmal A-fib ([place])   Chest pain   Principal Problem Healthcare associated pneumonia -Patient admitted to the hospital and started on vancomycin, cefepime, continue, discontinue vancomycin today, continue cefepime -Cultures no growth to date -Respiratory virus panel positive for Mehta pneumo virus, negative flu -Remains weak, fatigued, short of breath and tachypneic  Additional Problems Coronary artery disease with history of CABG -No chest pain this morning, troponin negative, continue aspirin, narrow beta-blockers due to active ongoing cocaine use  Chronic kidney disease stage III -Baseline creatinine 1.3-1.7, 1.9 on admission, 1.5 this morning  close to baseline.  Continue to monitor periodically  HIV -Outpatient management, continue home medications.  Most recent CD4 count at the beginning of this month was 270  Insulin-dependent diabetes mellitus -Continue Lantus and sliding scale, he was hypoglycemic this morning, his Lantus dose has been decreased  Reactive airway disease /asthma exacerbation -Wheezing on admission, started on prednisone, will taper off rapidly.  Continues to wheeze and keep on steroids  Paroxysmal A. fib -Currently in sinus, does not appear to be on anticoagulation, defer to outpatient management?  Whether his A. fib was an isolated event  History of PE -Finished anticoagulation  Hypertension -Blood pressure within normal limits, continue to monitor   Scheduled Meds: . aspirin  81 mg Oral Daily  . budesonide (PULMICORT) nebulizer solution  0.25 mg Nebulization BID  . emtricitabine-rilpivir-tenofovir AF  1 tablet Oral Q breakfast  . enoxaparin (LOVENOX) injection  40 mg Subcutaneous Q24H  . feeding supplement (GLUCERNA SHAKE)  237 mL Oral TID BM  . guaiFENesin  600 mg Oral BID  . insulin aspart  0-9 Units Subcutaneous Q4H  . insulin glargine  20 Units Subcutaneous QHS  . ipratropium-albuterol  3 mL Nebulization TID  . multivitamin with minerals  1 tablet Oral Daily  . predniSONE  40 mg Oral Q breakfast  . sertraline  50 mg Oral Daily   Continuous Infusions: . ceFEPime (MAXIPIME) IV 1 g (10/03/18 1602)  . vancomycin 1,500 mg (10/04/18 0822)   PRN Meds:.acetaminophen **OR** acetaminophen, gabapentin, HYDROcodone-acetaminophen, levalbuterol, ondansetron **OR** ondansetron (ZOFRAN) IV, traZODone  DVT prophylaxis: Lovenox Code Status: Full code Family Communication: No family present at bedside Disposition Plan: Home when ready  Consultants:   None  Procedures:   None   Antimicrobials:  Vancomycin 12/27 >> 12/29  Cefepime 12/27 >>  Objective: Vitals:   10/04/18 0818 10/04/18 0830  10/04/18 0941 10/04/18 1224  BP: 103/78   (!) 146/96  Pulse: 92   81  Resp: (!) 26   (!) 21  Temp: (!) 97.5 F (36.4 C)   98.7 F (37.1 C)  TempSrc: Oral     SpO2: 96% 98% 99% 100%  Weight:      Height:        Intake/Output Summary (Last 24 hours) at 10/04/2018 1228 Last data filed at 10/04/2018 0855 Gross per 24 hour  Intake 600 ml  Output 1500 ml  Net -900 ml   Filed Weights   10/02/18 1421 10/03/18 0015  Weight: 74.4 kg 79.1 kg    Examination:  Constitutional: Increased work of breathing Eyes: No scleral icterus ENMT: Moist with membranes Respiratory: Diffuse end expiratory wheezing, no crackles heard.  Increased respiratory effort, tachypneic Cardiovascular: Regular rate and rhythm, no murmurs appreciated.  No peripheral edema Abdomen: Soft, nontender, nondistended, bowel sounds positive Musculoskeletal: no clubbing / cyanosis. Skin: No rashes seen Neurologic: No focal deficits, equal strength Psychiatric: Normal judgment and insight. Alert and oriented x 3. Normal mood.    Data Reviewed: I have independently reviewed following labs and imaging studies    CBC: Recent Labs  Lab 2018/10/07 1546 10/02/18 1428 10/03/18 0750 10/04/18 0240  WBC 6.4 5.1 3.8* 4.4  NEUTROABS  --  3.3  --   --   HGB 10.4* 9.2* 9.6* 8.6*  HCT 33.3* 30.2* 29.6* 27.0*  MCV 81.6 81.2 80.4 80.6  PLT 150 129* PLATELET CLUMPS NOTED ON SMEAR, COUNT APPEARS DECREASED 902*   Basic Metabolic Panel: Recent Labs  Lab 10-07-18 1546 10/02/18 1428 10/03/18 0750 10/04/18 0240  NA 137 136 141 137  K 4.1 4.0 3.6 4.0  CL 103 104 110 106  CO2 23 25 19* 23  GLUCOSE 305* 485* 91 245*  BUN 22 20 11 21   CREATININE 1.98* 1.90* 1.28* 1.53*  CALCIUM 9.2 8.8* 8.0* 8.2*  MG  --   --  1.7  --   PHOS  --   --  3.1  --    GFR: Estimated Creatinine Clearance: 44.7 mL/min (A) (by C-G formula based on SCr of 1.53 mg/dL (H)). Liver Function Tests: Recent Labs  Lab Oct 07, 2018 1555 10/03/18 0750  AST  21 18  ALT 19 23  ALKPHOS 75 61  BILITOT 0.6 0.4  PROT 7.1 5.7*  ALBUMIN 3.3* 2.8*   Recent Labs  Lab 10-07-18 1555  LIPASE 44   No results for input(s): AMMONIA in the last 168 hours. Coagulation Profile: Recent Labs  Lab 10/03/18 0137  INR 0.97   Cardiac Enzymes: Recent Labs  Lab 10/02/18 2005 10/03/18 0137 10/03/18 0750  TROPONINI <0.03 <0.03 <0.03   BNP (last 3 results) No results for input(s): PROBNP in the last 8760 hours. HbA1C: Recent Labs    10/03/18 0137  HGBA1C 12.5*   CBG: Recent Labs  Lab 10/04/18 0004 10/04/18 0356 10/04/18 0743 10/04/18 0847 10/04/18 1217  GLUCAP 269* 171* 49* 193* 292*   Lipid Profile: No results for input(s): CHOL, HDL, LDLCALC, TRIG, CHOLHDL, LDLDIRECT in the last 72 hours. Thyroid Function Tests: Recent Labs    10/03/18 0137  TSH 2.140   Anemia Panel: Recent Labs    10/02/18 2005  VITAMINB12 298  FOLATE 10.9  FERRITIN 28  TIBC 335  IRON 21*  RETICCTPCT 1.5   Urine analysis:    Component Value Date/Time   COLORURINE YELLOW 09/16/2018 2035   APPEARANCEUR CLEAR 09/16/2018 2035   LABSPEC 1.027 09/16/2018 2035   PHURINE 5.0 09/16/2018 2035   GLUCOSEU >=500 (A) 09/16/2018 2035   HGBUR SMALL (A) 09/16/2018 2035   BILIRUBINUR NEGATIVE 09/16/2018 2035   KETONESUR NEGATIVE 09/16/2018 2035   PROTEINUR 100 (A) 09/16/2018 2035   UROBILINOGEN 0.2 08/13/2015 1030   NITRITE NEGATIVE 09/16/2018 2035   LEUKOCYTESUR NEGATIVE 09/16/2018 2035   Sepsis Labs: Invalid input(s): PROCALCITONIN, LACTICIDVEN  Recent Results (from the past 240 hour(s))  Blood culture (routine x 2)     Status: None (Preliminary result)   Collection Time: 10/02/18  7:00 PM  Result Value Ref Range Status   Specimen Description BLOOD RIGHT ANTECUBITAL  Final   Special Requests   Final    BOTTLES DRAWN AEROBIC AND ANAEROBIC Blood Culture adequate volume   Culture   Final    NO GROWTH 2 DAYS Performed at [facility], [address], [place], [place] [address]    Report Status PENDING  Incomplete  Blood culture (routine x 2)     Status: None (Preliminary result)   Collection Time: 10/02/18 10:12 PM  Result Value Ref Range Status   Specimen Description BLOOD RIGHT WRIST  Final   Special Requests   Final    BOTTLES DRAWN AEROBIC AND ANAEROBIC Blood Culture results may not be optimal due to an inadequate volume of blood received in culture bottles   Culture   Final    NO GROWTH 2 DAYS Performed at [facility], [place] [address] [place], [place] [address]    Report Status PENDING  Incomplete  Respiratory Panel by PCR     Status: Abnormal   Collection Time: 10/03/18 12:42 AM  Result Value Ref Range Status   Adenovirus NOT DETECTED NOT DETECTED Final   Coronavirus 229E NOT DETECTED NOT DETECTED Final   Coronavirus HKU1 NOT DETECTED NOT DETECTED Final   Coronavirus NL63 NOT DETECTED NOT DETECTED Final   Coronavirus OC43 NOT DETECTED NOT DETECTED Final   Metapneumovirus DETECTED (A) NOT DETECTED Final   Rhinovirus / Enterovirus NOT DETECTED NOT DETECTED Final   Influenza A NOT DETECTED NOT DETECTED Final   Influenza B NOT DETECTED NOT DETECTED Final   Parainfluenza Virus 1 NOT DETECTED NOT DETECTED Final   Parainfluenza Virus 2 NOT DETECTED NOT DETECTED Final   Parainfluenza Virus 3 NOT DETECTED NOT DETECTED Final   Parainfluenza Virus 4 NOT DETECTED NOT DETECTED Final   Respiratory Syncytial Virus NOT DETECTED NOT DETECTED Final   Bordetella pertussis NOT DETECTED NOT DETECTED Final   Chlamydophila pneumoniae NOT DETECTED NOT DETECTED Final   Mycoplasma pneumoniae NOT DETECTED NOT DETECTED Final    Comment: Performed at [facility], [address], [place], [place] [address]  MRSA PCR Screening     Status: None   Collection Time: 10/03/18 12:42 AM  Result Value Ref Range Status   MRSA by PCR NEGATIVE NEGATIVE Final    Comment:        The GeneXpert MRSA Assay (FDA approved for NASAL specimens only),  is one component of a comprehensive MRSA colonization surveillance program. It is not intended to diagnose MRSA infection nor to guide or monitor treatment for MRSA infections. Performed at [facility], [place] [address] [place],  [address]       Radiology Studies: Dg Chest 2 View  Result Date: 10/02/2018 CLINICAL DATA:  Cough, fever and shortness of breath today. EXAM: CHEST - 2 VIEW COMPARISON:  PA and lateral chest 09/29/2018, 09/16/2018 and 02/20/2018. FINDINGS: Seven intact median sternotomy wires and atrial appendage clip are unchanged. Mild elevation of the right hemidiaphragm is also unchanged. Lungs are clear. Heart size is normal. No pneumothorax or pleural effusion. No acute or focal bony abnormality. IMPRESSION: No acute disease. Electronically Signed   By: Inge Rise M.D.   On: 10/02/2018 15:13   Ct Angio Chest Pe W/cm &/or Wo Cm  Result Date: 10/02/2018 CLINICAL DATA:  Sore throat and cough since Monday. EXAM: CT ANGIOGRAPHY CHEST WITH CONTRAST TECHNIQUE: Multidetector CT imaging of the chest was performed using the standard protocol during bolus administration of intravenous contrast. Multiplanar CT image reconstructions and MIPs were obtained to evaluate the vascular anatomy. CONTRAST:  118mL ISOVUE-370 IOPAMIDOL (ISOVUE-370) INJECTION 76% COMPARISON:  None. FINDINGS: Cardiovascular: Left atrial appendage clip. The patient is status post median sternotomy and CABG. Satisfactory opacification of the pulmonary arteries to the segmental level without acute pulmonary embolus. Nonaneurysmal minimally atherosclerotic aorta without dissection. Heart size is within normal limits. No pericardial effusion or thickening. Patent great vessels without aneurysm or dissection. Mediastinum/Nodes: No enlarged mediastinal, hilar, or axillary lymph nodes. Thyroid gland, trachea, and esophagus demonstrate no significant findings. Lungs/Pleura: Superior segment right lower lobe  consolidation and atelectasis with air bronchograms suggested. No effusion or pneumothorax. Small patchy foci of airspace disease in the medial left lower lobe and anterior left upper lobe. Upper Abdomen: No acute abnormality. Musculoskeletal: ACDF of the included lower cervical spine. Thoracic spondylosis. Review of the MIP images confirms the above findings. IMPRESSION: 1. No acute pulmonary embolus. 2. Status post median sternotomy and CABG. 3. Superior segment right lower lobe consolidation and atelectasis. Small patchy foci of airspace disease in the left lower lobe and anterior left upper lobe. Findings are suspicious for multilobar pneumonia, greatest in the right lower lobe. Aortic Atherosclerosis (ICD10-I70.0). Electronically Signed   By: Ashley Royalty M.D.   On: 10/02/2018 17:54     Marzetta Board, MD, PhD [facility] Pager [phone]  If 7PM-7AM, please contact night-coverage [email or web address] Password [facility] 10/04/2018, 12:28 PM

## 2018-10-04 NOTE — Plan of Care (Signed)
Pt reports improvement in chest pain with PRN pain meds.  Reports chest pain is related to cough.

## 2018-10-04 NOTE — Evaluation (Signed)
Occupational Therapy Evaluation Patient Details Name: Michael [name] MRN: [id number] DOB: 10-11-1950 Today's Date: 10/04/2018    History of Present Illness 67 year old male with history of coronary artery disease with prior CABG, preserved EF on most recent echo, HIV on Elnoria Howard, ongoing cocaine abuse, diabetes mellitus, A. fib, history of PE who presents to the hospital with chief complaint of shortness of breath and productive cough.    Clinical Impression   PTA patient independent with ADLs using AE, and mobility using cane.  Admitted for above and limited by generalized weakness, decreased activity tolerance.  Patient able to complete UB ADLs with supervision, LB ADLs with min guard assist (simulated, as patient uses AE at home), transfers with min guard and grooming with supervision standing.  Noted increased fatigue with standing grooming tasks.  Patient will benefit from continued OT services while admitted in order to increase activity tolerance and safety with ADLs, but anticipate no further needs after dc.     Follow Up Recommendations  No OT follow up    Equipment Recommendations  3 in 1 bedside commode;Other (comment)    Recommendations for Other Services       Precautions / Restrictions Precautions Precautions: Fall Precaution Comments: pt reports history of falls Restrictions Weight Bearing Restrictions: No      Mobility Bed Mobility               General bed mobility comments: seated EOB upon entry  Transfers Overall transfer level: Needs assistance Equipment used: None Transfers: Sit to/from Stand Sit to Stand: Min guard         General transfer comment: for safety and immediate standing balance    Balance Overall balance assessment: History of Falls;Needs assistance Sitting-balance support: Bilateral upper extremity supported;Feet supported Sitting balance-Leahy Scale: Good     Standing balance support: Single extremity supported;During  functional activity Standing balance-Leahy Scale: Fair Standing balance comment: preference to UE support, reaching for furniture when walking                           ADL either performed or assessed with clinical judgement   ADL Overall ADL's : Needs assistance/impaired     Grooming: Supervision/safety;Wash/dry hands;Wash/dry face;Oral care;Standing       Lower Body Bathing: Min guard;Sit to/from stand Lower Body Bathing Details (indicate cue type and reason): reports using long sponge he recieved from previous admission  Upper Body Dressing : Supervision/safety;Sitting   Lower Body Dressing: Min guard;Sit to/from stand;With adaptive equipment;Cueing for compensatory techniques Lower Body Dressing Details (indicate cue type and reason): pt reports using AE he recieved during previous admission for LB dressing  Toilet Transfer: Min guard;Ambulation Toilet Transfer Details (indicate cue type and reason): simulated in room         Functional mobility during ADLs: Min guard General ADL Comments: min guard for safety, limited by decreased activity tolerance and generalized weakness     Vision Baseline Vision/History: Wears glasses Wears Glasses: Reading only Patient Visual Report: No change from baseline Vision Assessment?: No apparent visual deficits     Perception     Praxis      Pertinent Vitals/Pain Pain Assessment: Faces Faces Pain Scale: Hurts little more Pain Location: chest/ribs Pain Descriptors / Indicators: Sore Pain Intervention(s): Limited activity within patient's tolerance;Monitored during session     Hand Dominance Right   Extremity/Trunk Assessment Upper Extremity Assessment Upper Extremity Assessment: Generalized weakness   Lower Extremity Assessment Lower Extremity Assessment:  Defer to PT evaluation   Cervical / Trunk Assessment Cervical / Trunk Assessment: Normal   Communication Communication Communication: No difficulties    Cognition Arousal/Alertness: Awake/alert Behavior During Therapy: WFL for tasks assessed/performed Overall Cognitive Status: Within Functional Limits for tasks assessed                                     General Comments       Exercises     Shoulder Instructions      Home Living Family/patient expects to be discharged to:: Shelter/Homeless Living Arrangements: Group Home Available Help at Discharge: Family;Available PRN/intermittently Type of Home: Apartment Home Access: Level entry     Home Layout: One level     Bathroom Shower/Tub: Occupational psychologist: Standard     Home Equipment: Cane - single point;Adaptive equipment Adaptive Equipment: Reacher;Sock aid;Long-handled shoe horn;Long-handled sponge Additional Comments: reports moving to his own apartment on Friday?       Prior Functioning/Environment Level of Independence: Independent with assistive device(s)        Comments: reports independent with mobility, using SPC for mobility and independent with ADLs         OT Problem List: Decreased activity tolerance;Pain;Impaired balance (sitting and/or standing);Decreased knowledge of use of DME or AE      OT Treatment/Interventions: Self-care/ADL training;Therapeutic activities;Patient/family education;DME and/or AE instruction;Balance training    OT Goals(Current goals can be found in the care plan section) Acute Rehab OT Goals Patient Stated Goal: get better OT Goal Formulation: With patient Time For Goal Achievement: 10/18/18 Potential to Achieve Goals: Good  OT Frequency: Min 2X/week   Barriers to D/C:            Co-evaluation              AM-PAC OT "6 Clicks" Daily Activity     Outcome Measure Help from another person eating meals?: None Help from another person taking care of personal grooming?: None(seated ) Help from another person toileting, which includes using toliet, bedpan, or urinal?: A Little Help  from another person bathing (including washing, rinsing, drying)?: A Little Help from another person to put on and taking off regular upper body clothing?: None Help from another person to put on and taking off regular lower body clothing?: A Little 6 Click Score: 21   End of Session Equipment Utilized During Treatment: Gait belt Nurse Communication: Mobility status  Activity Tolerance: Patient tolerated treatment well Patient left: with call bell/phone within reach;Other (comment)(seated EOB)  OT Visit Diagnosis: Unsteadiness on feet (R26.81);Muscle weakness (generalized) (M62.81);Pain Pain - part of body: (ribs and chest)                Time: 6962-9528 OT Time Calculation (min): 22 min Charges:  OT General Charges $OT Visit: 1 Visit OT Evaluation $OT Eval Moderate Complexity: [place], OT Acute Rehabilitation Services Pager [phone] Office [phone]   Delight Stare 10/04/2018, 3:48 PM

## 2018-10-05 ENCOUNTER — Ambulatory Visit: Payer: Self-pay | Admitting: *Deleted

## 2018-10-05 ENCOUNTER — Other Ambulatory Visit: Payer: Self-pay | Admitting: *Deleted

## 2018-10-05 LAB — BASIC METABOLIC PANEL
Anion gap: 8 (ref 5–15)
BUN: 22 mg/dL (ref 8–23)
CO2: 24 mmol/L (ref 22–32)
Calcium: 8.9 mg/dL (ref 8.9–10.3)
Chloride: 107 mmol/L (ref 98–111)
Creatinine, Ser: 1.3 mg/dL — ABNORMAL HIGH (ref 0.61–1.24)
GFR calc Af Amer: >60 mL/min (ref >60)
GFR calc non Af Amer: 56 mL/min — ABNORMAL LOW (ref >60)
Glucose, Bld: 106 mg/dL — ABNORMAL HIGH (ref 70–99)
Potassium: 4 mmol/L (ref 3.5–5.1)
Sodium: 139 mmol/L (ref 135–145)

## 2018-10-05 LAB — CBC
HCT: 27.8 % — ABNORMAL LOW (ref 39.0–52.0)
Hemoglobin: 8.8 g/dL — ABNORMAL LOW (ref 13.0–17.0)
MCH: 25.4 pg — AB (ref 26.0–34.0)
MCHC: 31.7 g/dL (ref 30.0–36.0)
MCV: 80.3 fL (ref 80.0–100.0)
Platelets: 130 10*3/uL — ABNORMAL LOW (ref 150–400)
RBC: 3.46 MIL/uL — ABNORMAL LOW (ref 4.22–5.81)
RDW: 14.3 % (ref 11.5–15.5)
WBC: 5.7 10*3/uL (ref 4.0–10.5)
nRBC: 0 % (ref 0.0–0.2)

## 2018-10-05 LAB — GLUCOSE, CAPILLARY
GLUCOSE-CAPILLARY: 436 mg/dL — AB (ref 70–99)
GLUCOSE-CAPILLARY: 319 mg/dL — AB (ref 70–99)
Glucose-Capillary: 58 mg/dL — ABNORMAL LOW (ref 70–99)
Glucose-Capillary: 89 mg/dL (ref 70–99)
Glucose-Capillary: 157 mg/dL — ABNORMAL HIGH (ref 70–99)
Glucose-Capillary: 198 mg/dL — ABNORMAL HIGH (ref 70–99)
Glucose-Capillary: 365 mg/dL — ABNORMAL HIGH (ref 70–99)

## 2018-10-05 LAB — HIV 1/2 AB DIFFERENTIATION
HIV 1 Ab: POSITIVE — AB
HIV 2 Ab: NEGATIVE

## 2018-10-05 MED ORDER — INSULIN ASPART 100 UNIT/ML ~~LOC~~ SOLN
2.0000 [IU] | Freq: Three times a day (TID) | SUBCUTANEOUS | Status: DC
  Administered 2018-10-05 – 2018-10-07 (×7): 2 [IU] via SUBCUTANEOUS

## 2018-10-05 MED ORDER — INSULIN ASPART 100 UNIT/ML ~~LOC~~ SOLN
0.0000 [IU] | Freq: Three times a day (TID) | SUBCUTANEOUS | Status: DC
  Administered 2018-10-05: 2 [IU] via SUBCUTANEOUS
  Administered 2018-10-05: 9 [IU] via SUBCUTANEOUS
  Administered 2018-10-06: 2 [IU] via SUBCUTANEOUS
  Administered 2018-10-06: 9 [IU] via SUBCUTANEOUS
  Administered 2018-10-07: 2 [IU] via SUBCUTANEOUS
  Administered 2018-10-07: 5 [IU] via SUBCUTANEOUS

## 2018-10-05 MED ORDER — PREDNISONE 20 MG PO TABS
20.0000 mg | ORAL_TABLET | Freq: Every day | ORAL | Status: AC
  Administered 2018-10-06 – 2018-10-07 (×2): 20 mg via ORAL
  Filled 2018-10-05 (×2): qty 1

## 2018-10-05 MED ORDER — SODIUM CHLORIDE 0.9 % IV SOLN
1.0000 g | Freq: Two times a day (BID) | INTRAVENOUS | Status: DC
  Administered 2018-10-05 – 2018-10-07 (×4): 1 g via INTRAVENOUS
  Filled 2018-10-05 (×5): qty 1

## 2018-10-05 MED ORDER — INSULIN ASPART 100 UNIT/ML ~~LOC~~ SOLN: 0.0000 [IU] | Freq: Three times a day (TID) | SUBCUTANEOUS | Status: DC

## 2018-10-05 MED ORDER — INSULIN GLARGINE 100 UNIT/ML ~~LOC~~ SOLN
18.0000 [IU] | Freq: Every day | SUBCUTANEOUS | Status: DC
  Administered 2018-10-05 – 2018-10-06 (×2): 18 [IU] via SUBCUTANEOUS
  Filled 2018-10-05 (×3): qty 0.18

## 2018-10-05 NOTE — Progress Notes (Signed)
PT Cancellation Note  Patient Details Name: Michael [name] MRN: [id number] DOB: 07/25/1951   Cancelled Treatment:    Reason Eval/Treat Not Completed: Other (comment).  Declined PT since he was just finishing with OT session.  Try again at another time.   [name] 10/05/2018, 4:51 PM   [name], PT MS Acute Rehab Dept. Number: [place] and [place]

## 2018-10-05 NOTE — Progress Notes (Signed)
Pharmacy Antibiotic Note  Michael [name] is a 67 y.o. male admitted on 10/02/2018 with PNA, possible sepsis.  Pharmacy has been consulted for Cefepime dosing.  ID: PNA, possible sepsis, +042 - Afebrile. WBC WNL, Scr 1.3 down  12/27 Cefepime>> 12/27 Vanc >> 12/29  12/27 BCx: ng x 2 days  Resp Panel: Metapneumovirus  MRSA PRC: neg  Plan: Increase Cefepime to 1g IV q 12h  Height: 5\' 8"  (172.7 cm) Weight: 174 lb 6.1 oz (79.1 kg) IBW/kg (Calculated) : 68.4  Temp (24hrs), Avg:98.2 F (36.8 C), Min:97.9 F (36.6 C), Max:98.7 F (37.1 C)  Recent Labs  Lab 09/29/18 1546 10/02/18 1428 10/03/18 0137 10/03/18 0512 10/03/18 0750 10/04/18 0240 10/05/18 0308  WBC 6.4 5.1  --   --  3.8* 4.4 5.7  CREATININE 1.98* 1.90*  --   --  1.28* 1.53* 1.30*  LATICACIDVEN  --   --  1.7 1.3  --   --   --     Estimated Creatinine Clearance: 52.6 mL/min (A) (by C-G formula based on SCr of 1.3 mg/dL (H)).    No Known Allergies  Michael [name] S. Alford Highland, PharmD, BCPS Clinical Staff Pharmacist [email or web address] Michael [name] 10/05/2018 12:27 PM

## 2018-10-05 NOTE — Consult Note (Signed)
   [facility] Inpatient Consult   10/05/2018  Daronte Shostak 07/05/1951 [id number]  Patient is recently with attempted outreaches  with [facility] for chronic disease management services.  Our tema of RN [facility] and [facility] LCSW attempted follow up when he had returned to community but without success.  Our community based plan of care has focused on disease management and community resource support. Met with the patient at the bedside.  Patient was pleasant and verbalized that he has lost his phone.  He states his sister remains his contact person but is currently on vacation with her family. Will follow for progress and needs.  Patient states he is planning to get another phone when he transitions home.   Active consent on file. Will also follow up with community with information.  Of note, [facility] Care Management services does not replace or interfere with any services that are needed or arranged by inpatient case management or social work.  For additional questions or referrals please contact:   Natividad Brood, RN BSN [facility] Liaison  [phone] business mobile phone Toll free office [phone]

## 2018-10-05 NOTE — Progress Notes (Signed)
Nurse tech checked routine every 4 hour CBG which resulted at 411.  Paged [facility] who responded, discussed patient had just received 10 units of insulin an hour prior to the recheck, and as patient was 48 this am, advised to recheck with labs this am.  Also advised [place] that CBG would be checked at 0400 as well.

## 2018-10-05 NOTE — Progress Notes (Signed)
Occupational Therapy Treatment Patient Details Name: Michael [name] MRN: [id number] DOB: 28-Sep-1951 Today's Date: 10/05/2018    History of present illness 67 year old male with history of coronary artery disease with prior CABG, preserved EF on most recent echo, HIV on Elnoria Howard, ongoing cocaine abuse, diabetes mellitus, A. fib, history of PE who presents to the hospital with chief complaint of shortness of breath and productive cough.    OT comments  Patient progressing slowly.  Completes simulated toilet transfers with supervision today, simulated shower transfers with min guard assist for safety, and short distance mobility (5 feet outside of room) with min guard assist to close supervision.  Patient RR increased to 33 during activities, but given cueing for pursed lip breathing returns to < 25; reviewed energy conservation and PLB due to DOE and decreased tolerance. Agreeable to 3:1 for bathing seated in shower, but friends from shelter reports he will not be able to use in shower but they will assist with storing it until he moves into his new apartment.  Patient concerned about discharge to shelter due to decreased tolerance and ability to be outside from 6:30 am -7 pm, but will have his own apartment on Friday.  Will continue to follow.     Follow Up Recommendations  No OT follow up    Equipment Recommendations  3 in 1 bedside commode;Other (comment)    Recommendations for Other Services      Precautions / Restrictions Precautions Precautions: Fall Precaution Comments: pt reports history of falls Restrictions Weight Bearing Restrictions: No       Mobility Bed Mobility               General bed mobility comments: seated EOB upon entry  Transfers Overall transfer level: Needs assistance Equipment used: None Transfers: Sit to/from Stand Sit to Stand: Supervision         General transfer comment: close supervision for safety    Balance Overall balance  assessment: History of Falls;Needs assistance Sitting-balance support: No upper extremity supported;Feet supported Sitting balance-Leahy Scale: Good     Standing balance support: No upper extremity supported;During functional activity Standing balance-Leahy Scale: Fair Standing balance comment: preference to UE support, reaching for furniture when walking                           ADL either performed or assessed with clinical judgement   ADL Overall ADL's : Needs assistance/impaired             Lower Body Bathing: Supervison/ safety;Sit to/from stand Lower Body Bathing Details (indicate cue type and reason): close supervision for simulated bathing, educated on bathing sitting for energy conservation and activity tolerance; using long sponge for B feet     Lower Body Dressing: Minimal assistance;Sit to/from stand Lower Body Dressing Details (indicate cue type and reason): adjusting socks with supervision, increased effort but needs AE to don socks Toilet Transfer: Supervision/safety;Ambulation Toilet Transfer Details (indicate cue type and reason): simulated in room     Tub/ Shower Transfer: Walk-in shower;Min guard;Ambulation;Grab bars;3 in 1 Tub/Shower Transfer Details (indicate cue type and reason): reviewed shower safety seated, min guard for safety Functional mobility during ADLs: Min guard;Supervision/safety(min guard initally fading to supervision ) General ADL Comments: patient fatigues easily and presents with DOE during minimal activity RR increased to 33 but decreased with cueing for PLB     Vision   Vision Assessment?: No apparent visual deficits   Perception  Praxis      Cognition Arousal/Alertness: Awake/alert Behavior During Therapy: WFL for tasks assessed/performed Overall Cognitive Status: Within Functional Limits for tasks assessed                                          Exercises     Shoulder Instructions        General Comments on RA with oxgyen saturations maintained throughout; friends from shelter visiting at completion of session    Pertinent Vitals/ Pain       Pain Assessment: Faces Faces Pain Scale: Hurts little more Pain Location: chest/ribs from coughing  Pain Descriptors / Indicators: Sore Pain Intervention(s): Limited activity within patient's tolerance;Monitored during session  Home Living                                          Prior Functioning/Environment              Frequency  Min 2X/week        Progress Toward Goals  OT Goals(current goals can now be found in the care plan section)  Progress towards OT goals: Progressing toward goals  Acute Rehab OT Goals Patient Stated Goal: get better OT Goal Formulation: With patient Time For Goal Achievement: 10/18/18 Potential to Achieve Goals: Good  Plan Discharge plan remains appropriate;Frequency remains appropriate    Co-evaluation                 AM-PAC OT "6 Clicks" Daily Activity     Outcome Measure   Help from another person eating meals?: None Help from another person taking care of personal grooming?: None(seated) Help from another person toileting, which includes using toliet, bedpan, or urinal?: A Little Help from another person bathing (including washing, rinsing, drying)?: A Little Help from another person to put on and taking off regular upper body clothing?: None Help from another person to put on and taking off regular lower body clothing?: A Little 6 Click Score: 21    End of Session Equipment Utilized During Treatment: Gait belt  OT Visit Diagnosis: Unsteadiness on feet (R26.81);Muscle weakness (generalized) (M62.81);Pain Pain - Right/Left: Right Pain - part of body: (ribs and chest)   Activity Tolerance Patient tolerated treatment well   Patient Left with call bell/phone within reach;Other (comment)(seated EOB)   Nurse Communication Mobility status         Time: 2725-3664 OT Time Calculation (min): 21 min  Charges: OT General Charges $OT Visit: 1 Visit OT Treatments $Self Care/Home Management : 8-22 mins  Delight Stare, [place] Pager [phone] Office [phone]    Delight Stare 10/05/2018, 5:21 PM

## 2018-10-05 NOTE — Progress Notes (Signed)
PROGRESS NOTE  Michael [name] JQB:[id number] DOB: Oct 23, 1950 DOA: 10/02/2018 PCP: [name], PA-C   LOS: 3 days   Brief Narrative / Interim history: 67 year old male with history of coronary artery disease with prior CABG, preserved EF on most recent echo, HIV on Elnoria Howard, ongoing cocaine abuse, diabetes mellitus, A. fib, history of PE who presents to the hospital with chief complaint of shortness of breath and productive cough.  He has been having worsening dyspnea on exertion on and off for the past couple of weeks, progressively getting worse, not to the point that he cannot take few steps before having to stop.  Subjective: -improving but continues to have severe coughing spells. Has yet to walk outside his room, gets winded going to bathroom but will try today. He is concerned about his breathing status that he is living in a shelter and he has to leave the shelter at 6:30 am, and what impact living outdoors in the wintertime has on his pneumonia.   Denies chest pain or fevers  Assessment & Plan: Active Problems:   Uncontrolled type 2 diabetes with neuropathy ([name])   Hyperlipidemia with target LDL less than 100   3-vessel CAD   Cocaine abuse with cocaine-induced mood disorder ([place])   HCAP (healthcare-associated pneumonia)   Essential hypertension   Chronic kidney disease, stage III (moderate) ([name])   History of pulmonary embolism   Hyperglycemia due to type 2 diabetes mellitus ([name])   Paroxysmal A-fib ([place])   Chest pain   Principal Problem Healthcare associated pneumonia -Patient admitted to the hospital and started on vancomycin, cefepime -vancomycin d/c, his MRSA is negative -Cultures no growth to date -Respiratory virus panel positive for Mehta pneumo virus, negative flu -needs to ambulate more  Additional Problems Coronary artery disease with history of CABG -No chest pain, troponin negative, continue aspirin, no beta-blockers due to active ongoing cocaine  use  Chronic kidney disease stage III -Baseline creatinine 1.3-1.7, 1.9 on admission, at baseline today at 1.3  HIV -Outpatient management, continue home medications.  Most recent CD4 count at the beginning of this month was 270 -keep on IV antibiotics due to relative immunosuppression  Insulin-dependent diabetes mellitus -His CBGs vary wildly from 63s to 500s.  Discussed with diabetes educator, will adjust insulin regimen again today  Reactive airway disease /asthma exacerbation -Wheezing on admission, started on prednisone, will taper off rapidly given brittle diabetes.  Paroxysmal A. fib -Currently in sinus, does not appear to be on anticoagulation, defer to outpatient management?  Whether his A. fib was an isolated event  History of PE -Finished anticoagulation  Hypertension -Blood pressure within normal limits, continue to monitor  Disposition -Patient is homeless, admitted with multifocal pneumonia, the idea of spending nights in a homeless shelter and days outside in the cold is suboptimal and does not constitute a safe discharge plan.  I have discussed and consulted social worker, appreciate assistance   Scheduled Meds: . aspirin  81 mg Oral Daily  . budesonide (PULMICORT) nebulizer solution  0.25 mg Nebulization BID  . emtricitabine-rilpivir-tenofovir AF  1 tablet Oral Q breakfast  . enoxaparin (LOVENOX) injection  40 mg Subcutaneous Q24H  . feeding supplement (GLUCERNA SHAKE)  237 mL Oral TID BM  . guaiFENesin  600 mg Oral BID  . insulin aspart  0-9 Units Subcutaneous TID WC  . insulin aspart  2 Units Subcutaneous TID WC  . insulin glargine  18 Units Subcutaneous QHS  . ipratropium-albuterol  3 mL Nebulization TID  .  multivitamin with minerals  1 tablet Oral Daily  . [START ON 10/06/2018] predniSONE  20 mg Oral Q breakfast  . sertraline  50 mg Oral Daily   Continuous Infusions: . ceFEPime (MAXIPIME) IV 1 g (10/04/18 1746)   PRN Meds:.acetaminophen **OR**  acetaminophen, gabapentin, HYDROcodone-acetaminophen, levalbuterol, ondansetron **OR** ondansetron (ZOFRAN) IV, traZODone  DVT prophylaxis: Lovenox Code Status: Full code Family Communication: No family present at bedside Disposition Plan: Home when ready  Consultants:   None  Procedures:   None   Antimicrobials:  Vancomycin 12/27 >> 12/29  Cefepime 12/27 >>  Objective: Vitals:   10/05/18 0739 10/05/18 0741 10/05/18 0919 10/05/18 1158  BP:    (!) 144/86  Pulse:   97 92  Resp:   (!) 25 (!) 23  Temp:    98.7 F (37.1 C)  TempSrc:    Oral  SpO2: 96% 96% 97% 95%  Weight:      Height:        Intake/Output Summary (Last 24 hours) at 10/05/2018 1203 Last data filed at 10/05/2018 1100 Gross per 24 hour  Intake 810 ml  Output 1400 ml  Net -590 ml   Filed Weights   10/02/18 1421 10/03/18 0015  Weight: 74.4 kg 79.1 kg    Examination:  Constitutional: More comfortable today Eyes: No scleral icterus ENMT: MMM Respiratory: Bilateral end expiratory wheezing, no crackles.  Tachypneic. Cardiovascular: Regular rate and rhythm, no new murmurs.  No peripheral edema Abdomen: Soft, nontender, nondistended, positive bowel sounds Musculoskeletal: no clubbing / cyanosis. Skin: No rashes seen Neurologic: Nonfocal, equal strength, ambulatory Psychiatric: Normal judgment and insight. Alert and oriented x 3. Normal mood.    Data Reviewed: I have independently reviewed following labs and imaging studies    CBC: Recent Labs  Lab 10/28/18 1546 10/02/18 1428 10/03/18 0750 10/04/18 0240 10/05/18 0308  WBC 6.4 5.1 3.8* 4.4 5.7  NEUTROABS  --  3.3  --   --   --   HGB 10.4* 9.2* 9.6* 8.6* 8.8*  HCT 33.3* 30.2* 29.6* 27.0* 27.8*  MCV 81.6 81.2 80.4 80.6 80.3  PLT 150 129* PLATELET CLUMPS NOTED ON SMEAR, COUNT APPEARS DECREASED 101* 785*   Basic Metabolic Panel: Recent Labs  Lab 10-28-2018 1546 10/02/18 1428 10/03/18 0750 10/04/18 0240 10/04/18 2105 10/05/18 0308  NA  137 136 141 137  --  139  K 4.1 4.0 3.6 4.0  --  4.0  CL 103 104 110 106  --  107  CO2 23 25 19* 23  --  24  GLUCOSE 305* 485* 91 245* 500* 106*  BUN 22 20 11 21   --  22  CREATININE 1.98* 1.90* 1.28* 1.53*  --  1.30*  CALCIUM 9.2 8.8* 8.0* 8.2*  --  8.9  MG  --   --  1.7  --   --   --   PHOS  --   --  3.1  --   --   --    GFR: Estimated Creatinine Clearance: 52.6 mL/min (A) (by C-G formula based on SCr of 1.3 mg/dL (H)). Liver Function Tests: Recent Labs  Lab 10-28-2018 1555 10/03/18 0750  AST 21 18  ALT 19 23  ALKPHOS 75 61  BILITOT 0.6 0.4  PROT 7.1 5.7*  ALBUMIN 3.3* 2.8*   Recent Labs  Lab 10/28/18 1555  LIPASE 44   No results for input(s): AMMONIA in the last 168 hours. Coagulation Profile: Recent Labs  Lab 10/03/18 0137  INR 0.97   Cardiac Enzymes:  Recent Labs  Lab 10/02/18 2005 10/03/18 0137 10/03/18 0750  TROPONINI <0.03 <0.03 <0.03   BNP (last 3 results) No results for input(s): PROBNP in the last 8760 hours. HbA1C: Recent Labs    10/03/18 0137  HGBA1C 12.5*   CBG: Recent Labs  Lab 10/04/18 2329 10/05/18 0426 10/05/18 0453 10/05/18 0734 10/05/18 1156  GLUCAP 411* 58* 89 157* 198*   Lipid Profile: No results for input(s): CHOL, HDL, LDLCALC, TRIG, CHOLHDL, LDLDIRECT in the last 72 hours. Thyroid Function Tests: Recent Labs    10/03/18 0137  TSH 2.140   Anemia Panel: Recent Labs    10/02/18 2005  VITAMINB12 298  FOLATE 10.9  FERRITIN 28  TIBC 335  IRON 21*  RETICCTPCT 1.5   Urine analysis:    Component Value Date/Time   COLORURINE YELLOW 09/16/2018 2035   APPEARANCEUR CLEAR 09/16/2018 2035   LABSPEC 1.027 09/16/2018 2035   PHURINE 5.0 09/16/2018 2035   GLUCOSEU >=500 (A) 09/16/2018 2035   HGBUR SMALL (A) 09/16/2018 2035   BILIRUBINUR NEGATIVE 09/16/2018 2035   KETONESUR NEGATIVE 09/16/2018 2035   PROTEINUR 100 (A) 09/16/2018 2035   UROBILINOGEN 0.2 08/13/2015 1030   NITRITE NEGATIVE 09/16/2018 2035   LEUKOCYTESUR  NEGATIVE 09/16/2018 2035   Sepsis Labs: Invalid input(s): PROCALCITONIN, LACTICIDVEN  Recent Results (from the past 240 hour(s))  Blood culture (routine x 2)     Status: None (Preliminary result)   Collection Time: 10/02/18  7:00 PM  Result Value Ref Range Status   Specimen Description BLOOD RIGHT ANTECUBITAL  Final   Special Requests   Final    BOTTLES DRAWN AEROBIC AND ANAEROBIC Blood Culture adequate volume   Culture   Final    NO GROWTH 2 DAYS Performed at [facility], [address] [place], [place] [address]    Report Status PENDING  Incomplete  Blood culture (routine x 2)     Status: None (Preliminary result)   Collection Time: 10/02/18 10:12 PM  Result Value Ref Range Status   Specimen Description BLOOD RIGHT WRIST  Final   Special Requests   Final    BOTTLES DRAWN AEROBIC AND ANAEROBIC Blood Culture results may not be optimal due to an inadequate volume of blood received in culture bottles   Culture   Final    NO GROWTH 2 DAYS Performed at [facility], [place] [address] [place], [place] [address]    Report Status PENDING  Incomplete  Respiratory Panel by PCR     Status: Abnormal   Collection Time: 10/03/18 12:42 AM  Result Value Ref Range Status   Adenovirus NOT DETECTED NOT DETECTED Final   Coronavirus 229E NOT DETECTED NOT DETECTED Final   Coronavirus HKU1 NOT DETECTED NOT DETECTED Final   Coronavirus NL63 NOT DETECTED NOT DETECTED Final   Coronavirus OC43 NOT DETECTED NOT DETECTED Final   Metapneumovirus DETECTED (A) NOT DETECTED Final   Rhinovirus / Enterovirus NOT DETECTED NOT DETECTED Final   Influenza A NOT DETECTED NOT DETECTED Final   Influenza B NOT DETECTED NOT DETECTED Final   Parainfluenza Virus 1 NOT DETECTED NOT DETECTED Final   Parainfluenza Virus 2 NOT DETECTED NOT DETECTED Final   Parainfluenza Virus 3 NOT DETECTED NOT DETECTED Final   Parainfluenza Virus 4 NOT DETECTED NOT DETECTED Final   Respiratory Syncytial Virus NOT DETECTED  NOT DETECTED Final   Bordetella pertussis NOT DETECTED NOT DETECTED Final   Chlamydophila pneumoniae NOT DETECTED NOT DETECTED Final   Mycoplasma pneumoniae NOT  DETECTED NOT DETECTED Final    Comment: Performed at [facility], [place] [address] [place], [place] [address]  MRSA PCR Screening     Status: None   Collection Time: 10/03/18 12:42 AM  Result Value Ref Range Status   MRSA by PCR NEGATIVE NEGATIVE Final    Comment:        The GeneXpert MRSA Assay (FDA approved for NASAL specimens only), is one component of a comprehensive MRSA colonization surveillance program. It is not intended to diagnose MRSA infection nor to guide or monitor treatment for MRSA infections. Performed at [facility], [place] [address], [place], [place] [address]       Radiology Studies: No results found.   Marzetta Board, MD, PhD [facility] Pager [phone]  If 7PM-7AM, please contact night-coverage [email or web address] Password [email or web address] 10/05/2018, 12:03 PM

## 2018-10-05 NOTE — Patient Outreach (Signed)
[facility]) Care Management  10/05/2018  Shean Gerding [birth year] [id number]   Noted that member was readmitted to the hospital on 12/27 with pneumonia.  Hospital liaisons notified, will follow up with member pending disposition.  Valente David, [place], MSN [place] [phone]

## 2018-10-05 NOTE — Progress Notes (Signed)
Hypoglycemic Event  CBG: 58  Treatment: 120 cc orange juice  Symptoms:no complain Follow-up CBG: QFDV:4451 CBG Result:89  Possible Reasons for Event: Patient has had history of poor control, was 49 12/29 am, and 500 12/29 last evening.   Comments/MD notified:FYI page to [facility]

## 2018-10-05 NOTE — Progress Notes (Addendum)
Inpatient Diabetes Program Recommendations  AACE/ADA: New Consensus Statement on Inpatient Glycemic Control (2015)  Target Ranges:  Prepandial:   less than 140 mg/dL      Peak postprandial:   less than 180 mg/dL (1-2 hours)      Critically ill patients:  140 - 180 mg/dL   Lab Results  Component Value Date   GLUCAP 157 (H) 10/05/2018   HGBA1C 12.5 (H) 10/03/2018    Results for Michael [name], Michael [name] [facility] (MRN [id number]) as of 10/05/2018 11:12  Ref. Range 10/04/2018 08:47 10/04/2018 12:17 10/04/2018 16:50 10/04/2018 20:30 10/04/18 2129 10/04/2018 23:29 10/05/2018 04:26 10/05/2018 04:53 10/05/2018 07:34  Glucose-Capillary Latest Ref Range: 70 - 99 mg/dL 193 (H) 292 (H)  Novolog 5 units 364 (H)  Novolog 9 units at 1742 454 (H)  Novolog 10 units at 2218   Lantus 20 units 411 (H) 58 (L) 89 157 (H)    Diabetes history: DM2  Home Diabetes medications: Lantus 30 units QHS, Humalog 4 units tidwc, Metformin 500 mg bid  Current DM meds: Lantus 18 units q hs (to start tonight , was getting 20 units q hs)                                Novolog sensitive (0-9 units) Q 4 HOURS                                Novolog 2 units meal coverage tid (to start at lunch today)   Patient most likely went low this am due to the Novolog coverage last evening.  As patient is eating (in addition to nutritional supplements tid between meals) request that CBG checks and ordered Novolog sensitive coverage be changed from Q4 to ac&hs.  Will see patient later today regarding his increased Hgb A1c from 9.7% in July to 12.5% this admission.  Thank you.  -- Will follow during hospitalization.--  Jonna Clark RN, MSN Diabetes Coordinator Inpatient Glycemic Control Team Team Pager: [phone] (8am-5pm)

## 2018-10-05 NOTE — Progress Notes (Addendum)
Inpatient Diabetes Program Recommendations  AACE/ADA: New Consensus Statement on Inpatient Glycemic Control (2015)  Target Ranges:  Prepandial:   less than 140 mg/dL      Peak postprandial:   less than 180 mg/dL (1-2 hours)      Critically ill patients:  140 - 180 mg/dL   Lab Results  Component Value Date   GLUCAP 365 (H) 10/05/2018   HGBA1C 12.5 (H) 10/03/2018   Home DM medications:Lantus 30 units QHS, Humalog 4 units tidwc, Metformin 500 mg bid    Update:  Spoke to patient regarding diabetes management. He does not have a PCP. Phone message left  / order just placed for RN case manager to please arrange PCP for follow up care. Patient told me he gets his Lantus prescription filled either when he comes to the hospital or with his infectious disease doctor.    Importance of having a provider to manage patients diabetes discussed with him. He states he is taking his Lantus 30 units daily. Says he has a CBG machine that works and he usually has recordings of low 100s. He said he only takes the Novolog meal coverage when his blood sugar is above 180. He was unable to tell me how many times a week he might take the Novolog.  Regarding the Metformin he stated is causes GI upset and has not taken in several weeks. Informed patient there is an extended release form of this medication that he may be able to tolerate better. MD - if ordered at discharge perhaps he could have the extended release form?   I asked patient if there were gaps between when he was able to obtain his medicine. He stated there were. Informed patient that his current Hgb A1c was 12.5% (average of 312mg /dl over past 2-3 months) which is up from this July when it was 9.7% ([place]er[place] 232 mg/dl). Discussed how taking his medicine and diet plays a role in managing his A1c. Discussed with patient having one provider who is able to follow and manage his diabetes would be very helpful.   He told me he is moving into an apartment on  Friday. He asked me if he might qualify for a home health nurse/aide as he has had one in the past. I informed him I did not know but would pass on to the nurse case manager who sets up his follow up appt.   Thank you.  -- Will follow during hospitalization.--  Jonna Clark RN, MSN Diabetes Coordinator Inpatient Glycemic Control Team Team Pager: [phone] (8am-5pm)

## 2018-10-06 ENCOUNTER — Inpatient Hospital Stay (HOSPITAL_COMMUNITY): Payer: Medicare Other

## 2018-10-06 ENCOUNTER — Other Ambulatory Visit: Payer: Self-pay | Admitting: *Deleted

## 2018-10-06 LAB — GLUCOSE, CAPILLARY
Glucose-Capillary: 109 mg/dL — ABNORMAL HIGH (ref 70–99)
Glucose-Capillary: 153 mg/dL — ABNORMAL HIGH (ref 70–99)
Glucose-Capillary: 371 mg/dL — ABNORMAL HIGH (ref 70–99)
Glucose-Capillary: 258 mg/dL — ABNORMAL HIGH (ref 70–99)

## 2018-10-06 LAB — HIV ANTIBODY (ROUTINE TESTING W REFLEX): HIV Screen 4th Generation wRfx: REACTIVE

## 2018-10-06 NOTE — Care Management Important Message (Signed)
Important Message  Patient Details  Name: Michael [name] MRN: [id number] Date of Birth: 01-16-1951   Medicare Important Message Given:  Yes    Orbie Pyo 10/06/2018, 3:03 PM

## 2018-10-06 NOTE — Care Management Note (Signed)
Case Management Note  Patient Details  Name: Michael [name] MRN: [id number] Date of Birth: Nov 27, 1950  Subjective/Objective:    HCAP, DM, CAD, Cocaine                Action/Plan: Spoke to pt and states he has recently been approved for a public housing apt. States he has a monthly income. Contacted [facility] for [employer]. Pt is followed by [facility]. Attempted call to [facility] were he sees the Domenica Fail PA, PCP. Left message and waiting return call..   Expected Discharge Date:                 Expected Discharge Plan:  Home/Self Care  In-House Referral:  NA  Discharge planning Services  CM Consult  Post Acute Care Choice:  NA Choice offered to:  Patient  DME Arranged:  [name] DME Agency:  [place]:  NA HH Agency:  NA  Status of Service:  Completed, signed off  If discussed at [place] of Stay Meetings, dates discussed:    Additional Comments:  Erenest Rasher, RN 10/06/2018, 3:22 PM

## 2018-10-06 NOTE — Progress Notes (Signed)
PROGRESS NOTE  Michael [name] WVP:[id number] DOB: Jul 02, 1951 DOA: 10/02/2018 PCP: [name], PA-C   LOS: 4 days   Brief Narrative / Interim history: 67 year old male with history of coronary artery disease with prior CABG, preserved EF on most recent echo, HIV on Elnoria Howard, ongoing cocaine abuse, diabetes mellitus, A. fib, history of PE who presents to the hospital with chief complaint of shortness of breath and productive cough.  He has been having worsening dyspnea on exertion on and off for the past couple of weeks, progressively getting worse, not to the point that he cannot take few steps before having to stop.  Subjective: -Continues to feel significantly short of breath with walking.  He cannot take a deep breath without coughing spells.  He denies any fever or chills.  He denies any chest pain  Assessment & Plan: Active Problems:   Uncontrolled type 2 diabetes with neuropathy ([name])   Hyperlipidemia with target LDL less than 100   3-vessel CAD   Cocaine abuse with cocaine-induced mood disorder ([name])   HCAP (healthcare-associated pneumonia)   Essential hypertension   Chronic kidney disease, stage III (moderate) ([name])   History of pulmonary embolism   Hyperglycemia due to type 2 diabetes mellitus ([name])   Paroxysmal A-fib ([place])   Chest pain   Principal Problem Healthcare associated pneumonia -Patient admitted to the hospital and started on vancomycin, cefepime -vancomycin d/c, his MRSA is negative -Cultures no growth to date -Respiratory virus panel positive for Mehta pneumo virus, negative flu -With persistent very coarse breath sounds, rhonchi, wheezing, feels slightly worse today, will obtain a repeat two-view chest x-ray.  Additional Problems Coronary artery disease with history of CABG -No chest pain, troponin negative, continue aspirin, no beta-blockers due to active ongoing cocaine use  Chronic kidney disease stage III -Baseline creatinine 1.3-1.7, 1.9 on  admission, remains at baseline around 1.3, will repeat tomorrow morning  HIV -Outpatient management, continue home medications.  Most recent CD4 count at the beginning of this month was 270 -keep on IV antibiotics due to relative immunosuppression  Insulin-dependent diabetes mellitus -His CBGs were slightly better in the last 24 hours, did go up to 400 last night but he is no longer hypoglycemic this morning.  Keep on same regimen  Reactive airway disease /asthma exacerbation -Wheezing on admission, started on prednisone, will taper off rapidly given brittle diabetes.  Paroxysmal A. fib -Currently in sinus, does not appear to be on anticoagulation, defer to outpatient management?  Whether his A. fib was an isolated event  History of PE -Finished anticoagulation   Hypertension -Blood pressure within normal limits, continue to monitor  Disposition -Patient is homeless, admitted with multifocal pneumonia, the idea of spending nights in a homeless shelter and days outside in the cold is suboptimal and does not constitute a safe discharge plan.  He will need a letter on discharge advising patient to be allowed indoors during the daytime   Scheduled Meds: . aspirin  81 mg Oral Daily  . budesonide (PULMICORT) nebulizer solution  0.25 mg Nebulization BID  . emtricitabine-rilpivir-tenofovir AF  1 tablet Oral Q breakfast  . enoxaparin (LOVENOX) injection  40 mg Subcutaneous Q24H  . feeding supplement (GLUCERNA SHAKE)  237 mL Oral TID BM  . guaiFENesin  600 mg Oral BID  . insulin aspart  0-9 Units Subcutaneous TID WC  . insulin aspart  2 Units Subcutaneous TID WC  . insulin glargine  18 Units Subcutaneous QHS  . ipratropium-albuterol  3 mL Nebulization  TID  . multivitamin with minerals  1 tablet Oral Daily  . predniSONE  20 mg Oral Q breakfast  . sertraline  50 mg Oral Daily   Continuous Infusions: . ceFEPime (MAXIPIME) IV Stopped (10/06/18 0135)   PRN Meds:.acetaminophen **OR**  acetaminophen, gabapentin, HYDROcodone-acetaminophen, levalbuterol, ondansetron **OR** ondansetron (ZOFRAN) IV, traZODone  DVT prophylaxis: Lovenox Code Status: Full code Family Communication: No family present at bedside Disposition Plan: Home when ready  Consultants:   None  Procedures:   None   Antimicrobials:  Vancomycin 12/27 >> 12/29  Cefepime 12/27 >>  Objective: Vitals:   10/06/18 0759 10/06/18 0800 10/06/18 0839 10/06/18 0841  BP: 138/86 (!) 148/106    Pulse: 86     Resp: 11 (!) 23    Temp: 98.8 F (37.1 C)     TempSrc: Oral     SpO2: (!) 35%  97% 97%  Weight:      Height:        Intake/Output Summary (Last 24 hours) at 10/06/2018 1206 Last data filed at 10/06/2018 0932 Gross per 24 hour  Intake 900 ml  Output 1050 ml  Net -150 ml   Filed Weights   10/02/18 1421 10/03/18 0015  Weight: 74.4 kg 79.1 kg    Examination:  Constitutional: Seen ambulating from bed to sink, appears out of breath, coughing, tachypneic Eyes: No icterus ENMT: Moist mucous membranes Respiratory: Bilateral end expiratory wheezing, no crackles Cardiovascular: Regular rate and rhythm, no peripheral edema Abdomen: Soft, nontender, nondistended, positive bowel sounds Musculoskeletal: no clubbing / cyanosis. Skin: No rashes Neurologic: No focal deficits Psychiatric: Normal judgment and insight. Alert and oriented x 3. Normal mood.    Data Reviewed: I have independently reviewed following labs and imaging studies    CBC: Recent Labs  Lab 10-05-18 1546 10/02/18 1428 10/03/18 0750 10/04/18 0240 10/05/18 0308  WBC 6.4 5.1 3.8* 4.4 5.7  NEUTROABS  --  3.3  --   --   --   HGB 10.4* 9.2* 9.6* 8.6* 8.8*  HCT 33.3* 30.2* 29.6* 27.0* 27.8*  MCV 81.6 81.2 80.4 80.6 80.3  PLT 150 129* PLATELET CLUMPS NOTED ON SMEAR, COUNT APPEARS DECREASED 101* 213*   Basic Metabolic Panel: Recent Labs  Lab 10/05/2018 1546 10/02/18 1428 10/03/18 0750 10/04/18 0240 10/04/18 2105  10/05/18 0308  NA 137 136 141 137  --  139  K 4.1 4.0 3.6 4.0  --  4.0  CL 103 104 110 106  --  107  CO2 23 25 19* 23  --  24  GLUCOSE 305* 485* 91 245* 500* 106*  BUN 22 20 11 21   --  22  CREATININE 1.98* 1.90* 1.28* 1.53*  --  1.30*  CALCIUM 9.2 8.8* 8.0* 8.2*  --  8.9  MG  --   --  1.7  --   --   --   PHOS  --   --  3.1  --   --   --    GFR: Estimated Creatinine Clearance: 52.6 mL/min (A) (by C-G formula based on SCr of 1.3 mg/dL (H)). Liver Function Tests: Recent Labs  Lab 10/05/2018 1555 10/03/18 0750  AST 21 18  ALT 19 23  ALKPHOS 75 61  BILITOT 0.6 0.4  PROT 7.1 5.7*  ALBUMIN 3.3* 2.8*   Recent Labs  Lab 2018/10/05 1555  LIPASE 44   No results for input(s): AMMONIA in the last 168 hours. Coagulation Profile: Recent Labs  Lab 10/03/18 0137  INR 0.97   Cardiac  Enzymes: Recent Labs  Lab 10/02/18 2005 10/03/18 0137 10/03/18 0750  TROPONINI <0.03 <0.03 <0.03   BNP (last 3 results) No results for input(s): PROBNP in the last 8760 hours. HbA1C: No results for input(s): HGBA1C in the last 72 hours. CBG: Recent Labs  Lab 10/05/18 1156 10/05/18 1613 10/05/18 1841 10/05/18 2139 10/06/18 0757  GLUCAP 198* 365* 436* 319* 109*   Lipid Profile: No results for input(s): CHOL, HDL, LDLCALC, TRIG, CHOLHDL, LDLDIRECT in the last 72 hours. Thyroid Function Tests: No results for input(s): TSH, T4TOTAL, FREET4, T3FREE, THYROIDAB in the last 72 hours. Anemia Panel: No results for input(s): VITAMINB12, FOLATE, FERRITIN, TIBC, IRON, RETICCTPCT in the last 72 hours. Urine analysis:    Component Value Date/Time   COLORURINE YELLOW 09/16/2018 2035   APPEARANCEUR CLEAR 09/16/2018 2035   LABSPEC 1.027 09/16/2018 2035   PHURINE 5.0 09/16/2018 2035   GLUCOSEU >=500 (A) 09/16/2018 2035   HGBUR SMALL (A) 09/16/2018 2035   BILIRUBINUR NEGATIVE 09/16/2018 2035   KETONESUR NEGATIVE 09/16/2018 2035   PROTEINUR 100 (A) 09/16/2018 2035   UROBILINOGEN 0.2 08/13/2015 1030    NITRITE NEGATIVE 09/16/2018 2035   LEUKOCYTESUR NEGATIVE 09/16/2018 2035   Sepsis Labs: Invalid input(s): PROCALCITONIN, LACTICIDVEN  Recent Results (from the past 240 hour(s))  Blood culture (routine x 2)     Status: None (Preliminary result)   Collection Time: 10/02/18  7:00 PM  Result Value Ref Range Status   Specimen Description BLOOD RIGHT ANTECUBITAL  Final   Special Requests   Final    BOTTLES DRAWN AEROBIC AND ANAEROBIC Blood Culture adequate volume   Culture   Final    NO GROWTH 4 DAYS Performed at [facility], [address], [place], [place] [address]    Report Status PENDING  Incomplete  Blood culture (routine x 2)     Status: None (Preliminary result)   Collection Time: 10/02/18 10:12 PM  Result Value Ref Range Status   Specimen Description BLOOD RIGHT WRIST  Final   Special Requests   Final    BOTTLES DRAWN AEROBIC AND ANAEROBIC Blood Culture results may not be optimal due to an inadequate volume of blood received in culture bottles   Culture   Final    NO GROWTH 4 DAYS Performed at [facility], [place] [address], [place], [place] [address]    Report Status PENDING  Incomplete  Respiratory Panel by PCR     Status: Abnormal   Collection Time: 10/03/18 12:42 AM  Result Value Ref Range Status   Adenovirus NOT DETECTED NOT DETECTED Final   Coronavirus 229E NOT DETECTED NOT DETECTED Final   Coronavirus HKU1 NOT DETECTED NOT DETECTED Final   Coronavirus NL63 NOT DETECTED NOT DETECTED Final   Coronavirus OC43 NOT DETECTED NOT DETECTED Final   Metapneumovirus DETECTED (A) NOT DETECTED Final   Rhinovirus / Enterovirus NOT DETECTED NOT DETECTED Final   Influenza A NOT DETECTED NOT DETECTED Final   Influenza B NOT DETECTED NOT DETECTED Final   Parainfluenza Virus 1 NOT DETECTED NOT DETECTED Final   Parainfluenza Virus 2 NOT DETECTED NOT DETECTED Final   Parainfluenza Virus 3 NOT DETECTED NOT DETECTED Final   Parainfluenza Virus 4 NOT DETECTED NOT DETECTED  Final   Respiratory Syncytial Virus NOT DETECTED NOT DETECTED Final   Bordetella pertussis NOT DETECTED NOT DETECTED Final   Chlamydophila pneumoniae NOT DETECTED NOT DETECTED Final   Mycoplasma pneumoniae NOT DETECTED NOT DETECTED Final    Comment: Performed at [facility]  [facility], [place] [address], [place], [place] [address]  MRSA PCR Screening     Status: None   Collection Time: 10/03/18 12:42 AM  Result Value Ref Range Status   MRSA by PCR NEGATIVE NEGATIVE Final    Comment:        The GeneXpert MRSA Assay (FDA approved for NASAL specimens only), is one component of a comprehensive MRSA colonization surveillance program. It is not intended to diagnose MRSA infection nor to guide or monitor treatment for MRSA infections. Performed at [facility], [place] [address] [place], [place] [address]       Radiology Studies: No results found.   Marzetta Board, MD, PhD [facility] Pager [phone]  If 7PM-7AM, please contact night-coverage [email or web address] Password [email or web address] 10/06/2018, 12:06 PM

## 2018-10-06 NOTE — Progress Notes (Signed)
CRITICAL VALUE ALERT  Critical Value:  HIV Screen Positive  Date & Time Notied:  10/06/18 at 0925  Provider Notified: Cruzita Lederer  Orders Received/Actions taken: no new orders. Patient already being treated.

## 2018-10-06 NOTE — Plan of Care (Signed)
  Problem: Education: Goal: Knowledge of General Education information will improve Description Including pain rating scale, medication(s)/side effects and non-pharmacologic comfort measures Outcome: Progressing   Problem: Health Behavior/Discharge Planning: Goal: Ability to manage health-related needs will improve Outcome: Progressing   Problem: Clinical Measurements: Goal: Respiratory complications will improve Outcome: Progressing Goal: Cardiovascular complication will be avoided Outcome: Progressing   Problem: Activity: Goal: Risk for activity intolerance will decrease Outcome: Progressing   Problem: Nutrition: Goal: Adequate nutrition will be maintained Outcome: Progressing   Problem: Nutrition: Goal: Adequate nutrition will be maintained Outcome: Progressing   Problem: Pain Managment: Goal: General experience of comfort will improve Outcome: Progressing   Problem: Safety: Goal: Ability to remain free from injury will improve Outcome: Progressing

## 2018-10-06 NOTE — Patient Outreach (Signed)
[facility]) Care Management  10/06/2018  Michael [name] June 13, 1951 [id number]   CSW was scheduled to outreach to patient today to try and perform the initial phone assessment, as well as assess and assist with social work needs and services; however, CSW noted that patient was readmitted into the hospital on Friday, October 02, 2018, due to Pneumonia.  CSW will continue to follow patient while hospitalized then perform an outreach attempt once discharged back into the community. [name], BSW, MSW, LCSW  Licensed Education officer, [facility]  Mailing [place] [address], [place], [place] [address] Physical Address-[address], [place], [place] [address] Toll Free Main # [phone] Fax # [phone] Cell # [phone]  Office # [phone] [email or web address]

## 2018-10-06 NOTE — Progress Notes (Signed)
Physical Therapy Treatment Patient Details Name: Michael [name] MRN: [id number] DOB: [birth year] Today's Date: 10/06/2018    History of Present Illness 67 year old male with history of coronary artery disease with prior CABG, preserved EF on most recent echo, HIV on Elnoria Howard, ongoing cocaine abuse, diabetes mellitus, A. fib, history of PE who presents to the hospital with chief complaint of shortness of breath and productive cough.     PT Comments    Pt was seen for mobility on [facility] with better control of light headed feelings.  He is able to follow instructions for safety with gait and there ex but is still taking time to cue for avoiding obstacles.  He is expecting to leave the hosp to get HHPT for further strengthening, and talked with him about getting taping done to L patella to manage malalignment from a previous fall.  Will focus on strength and balance with inpt care.    Follow Up Recommendations  Supervision for mobility/OOB     Equipment Recommendations  Cane    Recommendations for Other Services OT consult     Precautions / Restrictions Precautions Precautions: Fall Precaution Comments: pt reports history of falls Restrictions Weight Bearing Restrictions: No    Mobility  Bed Mobility Overal bed mobility: Modified Independent                Transfers Overall transfer level: Needs assistance Equipment used: None Transfers: Sit to/from Stand Sit to Stand: Supervision            Ambulation/Gait Ambulation/Gait assistance: Min guard Gait Distance (Feet): 140 Feet Assistive device: Straight cane Gait Pattern/deviations: Step-through pattern;Decreased stride length Gait velocity: decreased Gait velocity interpretation: <1.31 ft/sec, indicative of household ambulator General Gait Details: pt is more steady today with less complaint of light headed feeling   Stairs             Wheelchair Mobility    Modified Rankin (Stroke Patients Only)        Balance Overall balance assessment: History of Falls;Needs assistance Sitting-balance support: Feet supported Sitting balance-Leahy Scale: Good       Standing balance-Leahy Scale: Fair                              Cognition Arousal/Alertness: Awake/alert Behavior During Therapy: WFL for tasks assessed/performed Overall Cognitive Status: Within Functional Limits for tasks assessed                                        Exercises General Exercises - Lower Extremity Ankle Circles/Pumps: AROM;Both;5 reps Long Arc Quad: Strengthening;Both;10 reps Heel Slides: Strengthening;Both;10 reps Hip ABduction/ADduction: 10 reps    General Comments General comments (skin integrity, edema, etc.): pt is using SPC today with better stability in R hand      Pertinent Vitals/Pain Pain Assessment: No/denies pain    Home Living                      Prior Function            PT Goals (current goals can now be found in the care plan section) Acute Rehab PT Goals Patient Stated Goal: get better Progress towards PT goals: Progressing toward goals    Frequency    Min 3X/week      PT Plan Current plan remains appropriate  Co-evaluation              AM-PAC PT "6 Clicks" Mobility   Outcome Measure  Help needed turning from your back to your side while in a flat bed without using bedrails?: None Help needed moving from lying on your back to sitting on the side of a flat bed without using bedrails?: A Little Help needed moving to and from a bed to a chair (including a wheelchair)?: A Little Help needed standing up from a chair using your arms (e.g., wheelchair or bedside chair)?: A Little Help needed to walk in hospital room?: A Little Help needed climbing 3-5 steps with a railing? : A Little 6 Click Score: 19    End of Session Equipment Utilized During Treatment: Gait belt Activity Tolerance: Patient limited by lethargy Patient  left: with call bell/phone within reach;in chair;with nursing/sitter in room Nurse Communication: Mobility status PT Visit Diagnosis: Unsteadiness on feet (R26.81);Muscle weakness (generalized) (M62.81)     Time: 1833-5825 PT Time Calculation (min) (ACUTE ONLY): 20 min  Charges:  $Gait Training: 8-22 mins                    [name] 10/06/2018, 8:52 PM   [name], PT MS Acute Rehab Dept. Number: [place] [facility] [place]

## 2018-10-06 NOTE — Clinical Social Work Note (Signed)
CSW confirmed with [facility] that pt was there prior to admission. Receptionist spoke with Supervisor and states that as long as they have a MD note stating pt needs to remain indoors pt can stay inside all day and night except for 10:30 AM-12:30 PM because that is when the cleaners come in and clean the shelter. MD notified.   [place], [place]

## 2018-10-07 LAB — CULTURE, BLOOD (ROUTINE X 2)
Culture: NO GROWTH
Culture: NO GROWTH
Special Requests: ADEQUATE

## 2018-10-07 LAB — GLUCOSE, CAPILLARY
Glucose-Capillary: 153 mg/dL — ABNORMAL HIGH (ref 70–99)
Glucose-Capillary: 251 mg/dL — ABNORMAL HIGH (ref 70–99)

## 2018-10-07 LAB — BASIC METABOLIC PANEL
Anion gap: 8 (ref 5–15)
BUN: 26 mg/dL — ABNORMAL HIGH (ref 8–23)
CO2: 26 mmol/L (ref 22–32)
Calcium: 9.2 mg/dL (ref 8.9–10.3)
Chloride: 103 mmol/L (ref 98–111)
Creatinine, Ser: 1.38 mg/dL — ABNORMAL HIGH (ref 0.61–1.24)
GFR calc Af Amer: >60 mL/min (ref >60)
GFR, EST NON AFRICAN AMERICAN: 52 mL/min — AB (ref >60)
Glucose, Bld: 224 mg/dL — ABNORMAL HIGH (ref 70–99)
Potassium: 3.9 mmol/L (ref 3.5–5.1)
Sodium: 137 mmol/L (ref 135–145)

## 2018-10-07 MED ORDER — LEVOFLOXACIN 750 MG PO TABS: 750.0000 mg | ORAL_TABLET | Freq: Every day | ORAL | 0 refills | Status: AC

## 2018-10-07 MED ORDER — GUAIFENESIN ER 600 MG PO TB12: 600.0000 mg | ORAL_TABLET | Freq: Two times a day (BID) | ORAL | 0 refills | Status: DC

## 2018-10-07 MED ORDER — LEVOFLOXACIN 750 MG PO TABS
750.0000 mg | ORAL_TABLET | Freq: Every day | ORAL | Status: DC
  Filled 2018-10-07: qty 1

## 2018-10-07 MED ORDER — PREDNISONE 10 MG PO TABS: ORAL_TABLET | ORAL | 0 refills | Status: DC

## 2018-10-07 MED ORDER — LEVOFLOXACIN 750 MG PO TABS
750.0000 mg | ORAL_TABLET | Freq: Every day | ORAL | Status: DC
  Administered 2018-10-07: 750 mg via ORAL

## 2018-10-07 MED ORDER — BENZONATATE 100 MG PO CAPS: 100.0000 mg | ORAL_CAPSULE | Freq: Three times a day (TID) | ORAL | 0 refills | Status: DC

## 2018-10-07 NOTE — Progress Notes (Signed)
SATURATION QUALIFICATIONS: (This note is used to comply with regulatory documentation for home oxygen)  Patient Saturations on Room Air at Rest = 96 %  Patient Saturations on Room Air while Ambulating = 92 %  

## 2018-10-08 ENCOUNTER — Other Ambulatory Visit: Payer: Self-pay | Admitting: *Deleted

## 2018-10-08 ENCOUNTER — Telehealth: Payer: Self-pay

## 2018-10-08 NOTE — Telephone Encounter (Signed)
Transition Care Management Follow-up Telephone Call Date of discharge and from where: 10/07/2018, [facility]   Multiple calls to made  # [phone], [phone], [phone] from different phones and different times during the day  in attempt to reach the patient. Each message stated that the call could not be completed at this time, or the person can't accept calls at this time.

## 2018-10-08 NOTE — Patient Outreach (Signed)
[facility]) Care Management  10/08/2018  Michael [name] 1951/06/09 [id number]   Notified that member discharged from hospital yesterday.  Per hospital liaison, member's sister (cell phone) is the best person to contact as member does not currently have a phone.  Call placed to sister's cell number listed on [facility] consent, no answer.  HIPAA compliant voice message left.  Call then placed to sister's cell phone listed in chart, no answer.  HIPAA compliant voice message left.  This is the 3rd unsuccessful attempt to contact member since initial referral.     Update:   Voice message received back from sister.  Call placed again, she is able to verify member's date of birth, unable to verify address.  State he told her that he found a place to live and moved, but did not tell her where.  She report member is in the hospital.  Advised that member was discharged yesterday.  She state he told her he would be discharged soon, but did not tell her the exact date.  She has not talked to member since discharge, unable to assist with transition of care assessment.  She state member should call her soon, request made for member to contact this care manager.  Contact information provided.  Will await call back, will make 4th and final attempt within the next 4 business days.  Valente David, [place], MSN [place] [phone]

## 2018-10-08 NOTE — Discharge Summary (Signed)
[facility] Discharge Summary   Patient: Michael [name] KWI:[id number]   PCP: [name], [place] DOB: 1951-01-27   Date of admission: 10/02/2018   Date of discharge: 10/07/2018   Discharge Diagnoses:  Principal diagnosis Healthcare associated pneumonia.  Active Problems:   Uncontrolled type 2 diabetes with neuropathy ([name])   Hyperlipidemia with target LDL less than 100   3-vessel CAD   Cocaine abuse with cocaine-induced mood disorder ([name])   HCAP (healthcare-associated pneumonia)   Essential hypertension   Chronic kidney disease, stage III (moderate) ([name])   History of pulmonary embolism   Hyperglycemia due to type 2 diabetes mellitus ([name])   Paroxysmal A-fib ([name])   Chest pain   Admitted From: home Disposition:  home  Recommendations for Outpatient Follow-up:  1. Please follow up with PCP in 1 week   Follow-up Information    [name], PA-C. Schedule an appointment as soon as possible for a visit in 1 week(s).   Specialty:  Physician Assistant Contact information: [address] [place] [address] [phone]          Diet recommendation: regular diet  Activity: The patient is advised to gradually reintroduce usual activities.  Discharge Condition: good  Code Status: full code  History of present illness: As per the H and P dictated on admission, "Michael [name] is a 68 y.o. male with medical history significant of CAD S/P CABG, preserved EF as of July echo, HIV on HAART, ongoing cocaine abuse, HTN, DM2.  A. fib, depression, gout, HLD, history of PE,    Presented with   Persistent shortness of breath and bad cough.  Patient endorsing worsening chest pain worse with cough increased dyspnea and increased dyspnea on exertion Difficulty completion of the sentence secondary to severe coughing spells and wheezing 2 weeks ago was seen in the emergency department shortness of breath he was seen on 11 December  for similar  complaints of shortness of breath has been admitted to the hospital for acute bronchitis/asthma exacerbation associated with suicidal ideation in the setting of substance abuse and bipolar disorder while hospitalized psychiatry has evaluated him and recommended inpatient psychiatric facility. He was seen again for chest pain shortness of breath on 24 December Prescription for prednisone and Tessalon. Comes in back to emergency department today complaining of cough sore throat for the past 5 days has been getting worse with worsening shortness of breath his blood sugar was noted to be elevated 584 heart rate 100 satting 90% room air. Note patient recently has been on steroids and has had recurrent steroid treatment for persistent wheezing. Continue to complain of chest pain has been intermittent for the past 3 days. Although he has been intermittently denying chest pain patient is unsure if he has any fevers but may be had some chills last CD4 count 270 on 16 September 2018 states he is compliant with his home medications. "  Hospital Course:  Summary of his active problems in the hospital is as following. Healthcare associated pneumonia -Patient admitted to the hospital and started on vancomycin, cefepime -vancomycin d/c, his MRSA is negative -Cultures no growth to date -Respiratory virus panel positive for Metapneumo virus, negative flu - repeat two-view chest x-ray negative.  Continue current regimen at home as well.  Coronary artery disease with history of CABG -No chest pain, troponin negative, continue aspirin, no beta-blockers due to active ongoing cocaine use  Chronic kidney disease stage III -Baseline creatinine 1.3-1.7, 1.9 on admission, remains at baseline  around 1.3  HIV -Outpatient management, continue home medications.  Most recent CD4 count at the beginning of this month was 270 -keep on IV antibiotics due to relative immunosuppression  Insulin-dependent diabetes  mellitus -His CBGs were slightly better in the last 24 hours, did go up to 400 last night but he is no longer hypoglycemic this morning.  Keep on same regimen  Reactive airway disease /asthma exacerbation -Wheezing on admission, started on prednisone, will taper off rapidly given brittle diabetes.  Paroxysmal A. fib -Currently in sinus, does not appear to be on anticoagulation, defer to outpatient management  History of PE -Finished anticoagulation   Hypertension -Blood pressure within normal limits, continue to monitor  Disposition -Patient is homeless, admitted with multifocal pneumonia, the idea of spending nights in a homeless shelter and days outside in the cold is suboptimal and does not constitute a safe discharge plan.  He will need a letter on discharge advising patient to be allowed indoors during the daytime  All other chronic medical condition were stable during the hospitalization.  Patient was ambulatory seen by physical therapy, who recommended no PT follow up needed  On the day of the discharge the patient's vitals were stable , and no other acute medical condition were reported by patient. the patient was felt safe to be discharge at home with family.  Consultants: none Procedures: none  DISCHARGE MEDICATION: Allergies as of 10/07/2018   No Known Allergies     Medication List    TAKE these medications   ACCU-CHEK SOFTCLIX LANCETS lancets USE TO CHECK BLOOD GLUCOSE LEVELS UP TO 4 TIMES DAILY AS DIRECTED   albuterol 108 (90 Base) MCG/ACT inhaler Commonly known as:  PROAIR HFA INHALE TWO PUFFS INTO THE LUNGS EVERY 6 (SIX) HOURS AS NEEDED FOR WHEEZING OR SHORTNESS OF BREATH. What changed:    how much to take  how to take this  when to take this  reasons to take this  additional instructions   allopurinol 300 MG tablet Commonly known as:  ZYLOPRIM Take 1 tablet (300 mg total) by mouth daily. For gout.   aspirin 81 MG chewable tablet Chew 81 mg by  mouth daily.   benzonatate 100 MG capsule Commonly known as:  TESSALON Take 1 capsule (100 mg total) by mouth every 8 (eight) hours.   emtricitabine-rilpivir-tenofovir AF 200-25-25 MG Tabs tablet Commonly known as:  ODEFSEY Take 1 tablet by mouth daily with breakfast. For HIV infection   FLOVENT DISKUS 100 MCG/BLIST Aepb Generic drug:  Fluticasone Propionate (Inhal) Inhale 1 puff into the lungs 2 (two) times daily.   gabapentin 400 MG capsule Commonly known as:  NEURONTIN Take 1 capsule (400 mg total) by mouth 2 (two) times daily. For agitation/neuropathic pain What changed:    when to take this  reasons to take this  additional instructions   guaiFENesin 600 MG 12 hr tablet Commonly known as:  MUCINEX Take 1 tablet (600 mg total) by mouth 2 (two) times daily.   hydrOXYzine 25 MG tablet Commonly known as:  ATARAX/VISTARIL Take 1 tablet (25 mg total) by mouth 3 (three) times daily as needed for anxiety.   insulin glargine 100 UNIT/ML injection Commonly known as:  LANTUS Inject 0.3 mLs (30 Units total) into the skin at bedtime. For diabetes management   insulin lispro 100 UNIT/ML injection Commonly known as:  HUMALOG Inject 0.05 mLs (5 Units total) into the skin 3 (three) times daily with meals. For diabetes management What changed:  how much to take  when to take this  additional instructions   Insulin Syringe-Needle U-100 27G X 5/8" 1 ML Misc Commonly known as:  B-D INS SYR MICROFINE 1CC/27G 1 strip by Does not apply route 3 (three) times daily as needed. For blood sugar checks   levofloxacin 750 MG tablet Commonly known as:  LEVAQUIN Take 1 tablet (750 mg total) by mouth daily for 3 days.   metFORMIN 500 MG tablet Commonly known as:  GLUCOPHAGE Take 1 tablet (500 mg total) by mouth 2 (two) times daily with a meal.   predniSONE 10 MG tablet Commonly known as:  DELTASONE Take 20mg  daily for 3days,Take 10mg  daily for 3days, then stop What changed:     medication strength  how much to take  how to take this  when to take this  additional instructions   sertraline 50 MG tablet Commonly known as:  ZOLOFT Take 1 tablet (50 mg total) by mouth daily.   traZODone 50 MG tablet Commonly known as:  DESYREL Take 1 tablet (50 mg total) by mouth at bedtime. What changed:    when to take this  reasons to take this      No Known Allergies Discharge Instructions    Diet - low sodium heart healthy   Complete by:  As directed    Discharge instructions   Complete by:  As directed    It is important that you read following instructions as well as go over your medication list with RN to help you understand your care after this hospitalization.  Discharge Instructions: Please follow-up with PCP in one week  Please request your primary care physician to go over all Hospital Tests and Procedure/Radiological results at the follow up,  Please get all Hospital records sent to your PCP by signing hospital release before you go home.   Do not take more than prescribed Pain, Sleep and Anxiety Medications. You were cared for by a hospitalist during your hospital stay. If you have any questions about your discharge medications or the care you received while you were in the hospital after you are discharged, you can call the unit you were admitted to and ask to speak with the hospitalist on call if the hospitalist that took care of you is not available.  Once you are discharged, your primary care physician will handle any further medical issues. Please note that NO REFILLS for any discharge medications will be authorized once you are discharged, as it is imperative that you return to your primary care physician (or establish a relationship with a primary care physician if you do not have one) for your aftercare needs so that they can reassess your need for medications and monitor your lab values. You Must read complete instructions/literature along  with all the possible adverse reactions/side effects for all the Medicines you take and that have been prescribed to you. Take any new Medicines after you have completely understood and accept all the possible adverse reactions/side effects. Wear Seat belts while driving. If you have smoked or chewed Tobacco in the last 2 yrs please stop smoking and/or stop any Recreational drug use.   Increase activity slowly   Complete by:  As directed      Discharge Exam: Filed Weights   10/02/18 1421 10/03/18 0015  Weight: 74.4 kg 79.1 kg   Vitals:   10/07/18 1224 10/07/18 1300  BP: 105/68   Pulse: 84 87  Resp: 16 18  Temp: 98.4 F (36.9 C)  SpO2: 96% 96%   General: Appear in no distress, no Rash; Oral Mucosa moist Cardiovascular: S1 and S2 Present, no Murmur, no JVD Respiratory: Bilateral Air entry present and Clear to Auscultation, no Crackles, Occasional wheezes Abdomen: Bowel Sound present, Soft and no tenderness Extremities: no Pedal edema, no calf tenderness Neurology: Grossly no focal neuro deficit.  The results of significant diagnostics from this hospitalization (including imaging, microbiology, ancillary and laboratory) are listed below for reference.    Significant Diagnostic Studies: Dg Chest 2 View  Result Date: 10/06/2018 CLINICAL DATA:  Short of breath, cough for 5 days, history of CABG in May of 2018 EXAM: CHEST - 2 VIEW COMPARISON:  CT chest of 10/02/2017 and chest x-ray of the same day FINDINGS: There is chronic elevation of the right hemidiaphragm with right basilar atelectasis. The left lung is clear. Mediastinal and hilar contours are unchanged and the heart is within normal limits in size. A left atrial exclusion device is noted and median sternotomy sutures are present from prior CABG. There are degenerative changes in the mid to lower thoracic spine. IMPRESSION: Stable chest x-ray with elevation of the right hemidiaphragm and linear atelectasis at the right lung base. No  active process. Electronically Signed   By: Ivar Drape M.D.   On: 10/06/2018 13:28   Dg Chest 2 View  Result Date: 10/02/2018 CLINICAL DATA:  Cough, fever and shortness of breath today. EXAM: CHEST - 2 VIEW COMPARISON:  PA and lateral chest 09/29/2018, 09/16/2018 and 02/20/2018. FINDINGS: Seven intact median sternotomy wires and atrial appendage clip are unchanged. Mild elevation of the right hemidiaphragm is also unchanged. Lungs are clear. Heart size is normal. No pneumothorax or pleural effusion. No acute or focal bony abnormality. IMPRESSION: No acute disease. Electronically Signed   By: Inge Rise M.D.   On: 10/02/2018 15:13   Dg Chest 2 View  Result Date: 09/29/2018 CLINICAL DATA:  Cough for 2 days. EXAM: CHEST - 2 VIEW COMPARISON:  09/16/2018 FINDINGS: Heart size is normal. Coronary artery stent noted as well as prior CABG. Chronic elevation of right hemidiaphragm is stable. Mild scarring noted in right lung base. No evidence of pulmonary infiltrate or edema. No evidence of pleural effusion. Cervical spine fusion hardware is also noted. IMPRESSION: Stable elevation of right hemidiaphragm and mild right basilar scarring. No active cardiopulmonary disease. Electronically Signed   By: Earle Gell M.D.   On: 09/29/2018 16:21   Dg Chest 2 View  Result Date: 09/16/2018 CLINICAL DATA:  Mildly productive cough. Shortness of breath. EXAM: CHEST - 2 VIEW COMPARISON:  06/08/2018 and chest CT dated 09/02/2018. FINDINGS: Normal sized heart. Tortuous aorta. Stable left atrial appendage clip and coronary artery stent. Post CABG changes are again demonstrated. Stable small amount of linear scarring in the lingula or right middle lobe and small amount of linear scarring in the right perihilar region. Cervical spine fixation hardware. IMPRESSION: No acute abnormality. Electronically Signed   By: Claudie Revering M.D.   On: 09/16/2018 21:21   Ct Angio Chest Pe W/cm &/or Wo Cm  Result Date:  10/02/2018 CLINICAL DATA:  Sore throat and cough since Monday. EXAM: CT ANGIOGRAPHY CHEST WITH CONTRAST TECHNIQUE: Multidetector CT imaging of the chest was performed using the standard protocol during bolus administration of intravenous contrast. Multiplanar CT image reconstructions and MIPs were obtained to evaluate the vascular anatomy. CONTRAST:  138mL ISOVUE-370 IOPAMIDOL (ISOVUE-370) INJECTION 76% COMPARISON:  None. FINDINGS: Cardiovascular: Left atrial appendage clip. The patient is status post median  sternotomy and CABG. Satisfactory opacification of the pulmonary arteries to the segmental level without acute pulmonary embolus. Nonaneurysmal minimally atherosclerotic aorta without dissection. Heart size is within normal limits. No pericardial effusion or thickening. Patent great vessels without aneurysm or dissection. Mediastinum/Nodes: No enlarged mediastinal, hilar, or axillary lymph nodes. Thyroid gland, trachea, and esophagus demonstrate no significant findings. Lungs/Pleura: Superior segment right lower lobe consolidation and atelectasis with air bronchograms suggested. No effusion or pneumothorax. Small patchy foci of airspace disease in the medial left lower lobe and anterior left upper lobe. Upper Abdomen: No acute abnormality. Musculoskeletal: ACDF of the included lower cervical spine. Thoracic spondylosis. Review of the MIP images confirms the above findings. IMPRESSION: 1. No acute pulmonary embolus. 2. Status post median sternotomy and CABG. 3. Superior segment right lower lobe consolidation and atelectasis. Small patchy foci of airspace disease in the left lower lobe and anterior left upper lobe. Findings are suspicious for multilobar pneumonia, greatest in the right lower lobe. Aortic Atherosclerosis (ICD10-I70.0). Electronically Signed   By: Ashley Royalty M.D.   On: 10/02/2018 17:54    Microbiology: Recent Results (from the past 240 hour(s))  Blood culture (routine x 2)     Status: None    Collection Time: 10/02/18  7:00 PM  Result Value Ref Range Status   Specimen Description BLOOD RIGHT ANTECUBITAL  Final   Special Requests   Final    BOTTLES DRAWN AEROBIC AND ANAEROBIC Blood Culture adequate volume   Culture   Final    NO GROWTH 5 DAYS Performed at [facility], [address] [place], [place] [address]    Report Status 10/07/2018 FINAL  Final  Blood culture (routine x 2)     Status: None   Collection Time: 10/02/18 10:12 PM  Result Value Ref Range Status   Specimen Description BLOOD RIGHT WRIST  Final   Special Requests   Final    BOTTLES DRAWN AEROBIC AND ANAEROBIC Blood Culture results may not be optimal due to an inadequate volume of blood received in culture bottles   Culture   Final    NO GROWTH 5 DAYS Performed at [facility], [place] [address] [place], [place] [address]    Report Status 10/07/2018 FINAL  Final  Respiratory Panel by PCR     Status: Abnormal   Collection Time: 10/03/18 12:42 AM  Result Value Ref Range Status   Adenovirus NOT DETECTED NOT DETECTED Final   Coronavirus 229E NOT DETECTED NOT DETECTED Final   Coronavirus HKU1 NOT DETECTED NOT DETECTED Final   Coronavirus NL63 NOT DETECTED NOT DETECTED Final   Coronavirus OC43 NOT DETECTED NOT DETECTED Final   Metapneumovirus DETECTED (A) NOT DETECTED Final   Rhinovirus / Enterovirus NOT DETECTED NOT DETECTED Final   Influenza A NOT DETECTED NOT DETECTED Final   Influenza B NOT DETECTED NOT DETECTED Final   Parainfluenza Virus 1 NOT DETECTED NOT DETECTED Final   Parainfluenza Virus 2 NOT DETECTED NOT DETECTED Final   Parainfluenza Virus 3 NOT DETECTED NOT DETECTED Final   Parainfluenza Virus 4 NOT DETECTED NOT DETECTED Final   Respiratory Syncytial Virus NOT DETECTED NOT DETECTED Final   Bordetella pertussis NOT DETECTED NOT DETECTED Final   Chlamydophila pneumoniae NOT DETECTED NOT DETECTED Final   Mycoplasma pneumoniae NOT DETECTED NOT DETECTED Final    Comment: Performed at  [facility], [place] [address] [place], [place] [address]  MRSA PCR Screening     Status: None   Collection Time:  10/03/18 12:42 AM  Result Value Ref Range Status   MRSA by PCR NEGATIVE NEGATIVE Final    Comment:        The GeneXpert MRSA Assay (FDA approved for NASAL specimens only), is one component of a comprehensive MRSA colonization surveillance program. It is not intended to diagnose MRSA infection nor to guide or monitor treatment for MRSA infections. Performed at [facility], [place] [address], [place], [place] [address]      Labs: CBC: Recent Labs  Lab 10/02/18 1428 10/03/18 0750 10/04/18 0240 10/05/18 0308  WBC 5.1 3.8* 4.4 5.7  NEUTROABS 3.3  --   --   --   HGB 9.2* 9.6* 8.6* 8.8*  HCT 30.2* 29.6* 27.0* 27.8*  MCV 81.2 80.4 80.6 80.3  PLT 129* PLATELET CLUMPS NOTED ON SMEAR, COUNT APPEARS DECREASED 101* 254*   Basic Metabolic Panel: Recent Labs  Lab 10/02/18 1428 10/03/18 0750 10/04/18 0240 10/04/18 2105 10/05/18 0308 10/07/18 0322  NA 136 141 137  --  139 137  K 4.0 3.6 4.0  --  4.0 3.9  CL 104 110 106  --  107 103  CO2 25 19* 23  --  24 26  GLUCOSE 485* 91 245* 500* 106* 224*  BUN 20 11 21   --  22 26*  CREATININE 1.90* 1.28* 1.53*  --  1.30* 1.38*  CALCIUM 8.8* 8.0* 8.2*  --  8.9 9.2  MG  --  1.7  --   --   --   --   PHOS  --  3.1  --   --   --   --    Liver Function Tests: Recent Labs  Lab 10/03/18 0750  AST 18  ALT 23  ALKPHOS 61  BILITOT 0.4  PROT 5.7*  ALBUMIN 2.8*   No results for input(s): LIPASE, AMYLASE in the last 168 hours. No results for input(s): AMMONIA in the last 168 hours. Cardiac Enzymes: Recent Labs  Lab 10/02/18 2005 10/03/18 0137 10/03/18 0750  TROPONINI <0.03 <0.03 <0.03   BNP (last 3 results) Recent Labs    02/20/18 1318 05/04/18 0237  BNP 83.0 36.1   CBG: Recent Labs  Lab 10/06/18 1219 10/06/18 1721 10/06/18 2135 10/07/18 0752 10/07/18 1222  GLUCAP 153* 371* 258* 153* 251*   Time  spent: 35 minutes  Signed:  Berle Mull  [facility] 10/07/2018 , 3:18 PM

## 2018-10-09 ENCOUNTER — Telehealth: Payer: Self-pay

## 2018-10-09 ENCOUNTER — Other Ambulatory Visit: Payer: Self-pay | Admitting: *Deleted

## 2018-10-09 ENCOUNTER — Ambulatory Visit: Payer: Self-pay | Admitting: *Deleted

## 2018-10-09 NOTE — Patient Outreach (Signed)
[facility]) Care Management  10/09/2018  Michael [name] 1951-08-11 [id number]    CSW made a third and final attempt to try and contact patient today to perform phone assessment, as well as assess and assist with social work needs and services, without success.  CSW also attempted to contact patient's sister, Michael [name], but she was unavailable at the time of CSW's call.  HIPAA compliant messages were left for patient and [name] on voicemail.  CSW is currently awaiting a return call.  CSW will proceed with case closure in one business days, if a return call is not received in the meantime, as required number of phone attempts have been made and an outreach letter was mailed to patient's home allowing 10 business days for a response. [name], BSW, MSW, LCSW  Licensed Education officer, [facility]  Mailing [place] [address], [place], [place] [address] Physical Address-[address], [place], [place] [address] Toll Free Main # [phone] Fax # [phone] Cell # [phone]  Office # [phone] [email or web address]

## 2018-10-09 NOTE — Telephone Encounter (Signed)
Transition Care Management Follow-up Telephone Call Date of discharge and from where: 10/07/2018, [facility].  Call placed to # [phone], message stated that the calls can't be accepted at this time.  Call placed  # [phone], message states that service is unavailable or there are restrictions with this number.  Call placed # [phone], message states calling instructions are preventing completion of the call.

## 2018-10-11 ENCOUNTER — Encounter (HOSPITAL_COMMUNITY): Payer: Self-pay | Admitting: Emergency Medicine

## 2018-10-11 ENCOUNTER — Other Ambulatory Visit: Payer: Self-pay

## 2018-10-11 ENCOUNTER — Emergency Department (HOSPITAL_COMMUNITY)
Admission: EM | Admit: 2018-10-11 | Discharge: 2018-10-12 | Disposition: A | Discharge status: Other Acute Inpatient Hospital | Payer: Medicare Other | Attending: Emergency Medicine | Admitting: Emergency Medicine

## 2018-10-11 ENCOUNTER — Emergency Department (HOSPITAL_COMMUNITY): Payer: Medicare Other

## 2018-10-11 DIAGNOSIS — R0789 Other chest pain: Secondary | ICD-10-CM | POA: Diagnosis not present

## 2018-10-11 DIAGNOSIS — Z86711 Personal history of pulmonary embolism: Secondary | ICD-10-CM | POA: Insufficient documentation

## 2018-10-11 DIAGNOSIS — J986 Disorders of diaphragm: Secondary | ICD-10-CM | POA: Diagnosis not present

## 2018-10-11 DIAGNOSIS — F329 Major depressive disorder, single episode, unspecified: Secondary | ICD-10-CM | POA: Insufficient documentation

## 2018-10-11 DIAGNOSIS — Z794 Long term (current) use of insulin: Secondary | ICD-10-CM | POA: Insufficient documentation

## 2018-10-11 DIAGNOSIS — I2510 Atherosclerotic heart disease of native coronary artery without angina pectoris: Secondary | ICD-10-CM | POA: Diagnosis not present

## 2018-10-11 DIAGNOSIS — R059 Cough, unspecified: Secondary | ICD-10-CM

## 2018-10-11 DIAGNOSIS — R05 Cough: Secondary | ICD-10-CM | POA: Diagnosis not present

## 2018-10-11 DIAGNOSIS — R079 Chest pain, unspecified: Secondary | ICD-10-CM | POA: Diagnosis not present

## 2018-10-11 DIAGNOSIS — B20 Human immunodeficiency virus [HIV] disease: Secondary | ICD-10-CM | POA: Insufficient documentation

## 2018-10-11 DIAGNOSIS — I499 Cardiac arrhythmia, unspecified: Secondary | ICD-10-CM | POA: Diagnosis not present

## 2018-10-11 DIAGNOSIS — E119 Type 2 diabetes mellitus without complications: Secondary | ICD-10-CM | POA: Insufficient documentation

## 2018-10-11 DIAGNOSIS — R45851 Suicidal ideations: Secondary | ICD-10-CM

## 2018-10-11 DIAGNOSIS — E1165 Type 2 diabetes mellitus with hyperglycemia: Secondary | ICD-10-CM | POA: Diagnosis not present

## 2018-10-11 LAB — COMPREHENSIVE METABOLIC PANEL
ALBUMIN: 3.4 g/dL — AB (ref 3.5–5.0)
ALT: 20 U/L (ref 0–44)
AST: 20 U/L (ref 15–41)
Alkaline Phosphatase: 66 U/L (ref 38–126)
Anion gap: 9 (ref 5–15)
BUN: 37 mg/dL — AB (ref 8–23)
CO2: 23 mmol/L (ref 22–32)
CREATININE: 2.01 mg/dL — AB (ref 0.61–1.24)
Calcium: 8.9 mg/dL (ref 8.9–10.3)
Chloride: 103 mmol/L (ref 98–111)
GFR calc Af Amer: 38 mL/min — ABNORMAL LOW (ref >60)
GFR calc non Af Amer: 33 mL/min — ABNORMAL LOW (ref >60)
Glucose, Bld: 398 mg/dL — ABNORMAL HIGH (ref 70–99)
Potassium: 4.2 mmol/L (ref 3.5–5.1)
Sodium: 135 mmol/L (ref 135–145)
Total Bilirubin: 0.7 mg/dL (ref 0.3–1.2)
Total Protein: 7.4 g/dL (ref 6.5–8.1)

## 2018-10-11 LAB — CBG MONITORING, ED
Glucose-Capillary: 376 mg/dL — ABNORMAL HIGH (ref 70–99)
Glucose-Capillary: 112 mg/dL — ABNORMAL HIGH (ref 70–99)

## 2018-10-11 LAB — RAPID URINE DRUG SCREEN, HOSP PERFORMED
Amphetamines: NOT DETECTED
Barbiturates: NOT DETECTED
Benzodiazepines: NOT DETECTED
Cocaine: POSITIVE — AB
Opiates: NOT DETECTED
Tetrahydrocannabinol: NOT DETECTED

## 2018-10-11 LAB — CBC
HCT: 36 % — ABNORMAL LOW (ref 39.0–52.0)
Hemoglobin: 10.8 g/dL — ABNORMAL LOW (ref 13.0–17.0)
MCH: 25.2 pg — ABNORMAL LOW (ref 26.0–34.0)
MCHC: 30 g/dL (ref 30.0–36.0)
MCV: 83.9 fL (ref 80.0–100.0)
PLATELETS: 233 10*3/uL (ref 150–400)
RBC: 4.29 MIL/uL (ref 4.22–5.81)
RDW: 14.1 % (ref 11.5–15.5)
WBC: 3.9 10*3/uL — ABNORMAL LOW (ref 4.0–10.5)
nRBC: 0 % (ref 0.0–0.2)

## 2018-10-11 LAB — I-STAT TROPONIN, ED
Troponin i, poc: 0.01 ng/mL (ref 0.00–0.08)
Troponin i, poc: 0 ng/mL (ref 0.00–0.08)

## 2018-10-11 LAB — ETHANOL

## 2018-10-11 LAB — SALICYLATE LEVEL: Salicylate Lvl: <7 mg/dL (ref 2.8–30.0)

## 2018-10-11 LAB — ACETAMINOPHEN LEVEL: Acetaminophen (Tylenol), Serum: <10 ug/mL — ABNORMAL LOW (ref 10–30)

## 2018-10-11 MED ORDER — EMTRICITAB-RILPIVIR-TENOFOV AF 200-25-25 MG PO TABS
1.0000 | ORAL_TABLET | Freq: Every day | ORAL | Status: DC
  Administered 2018-10-12: 1 via ORAL
  Filled 2018-10-11: qty 1

## 2018-10-11 MED ORDER — ALBUTEROL SULFATE HFA 108 (90 BASE) MCG/ACT IN AERS: 2.0000 | INHALATION_SPRAY | Freq: Four times a day (QID) | RESPIRATORY_TRACT | Status: DC | PRN

## 2018-10-11 MED ORDER — BENZONATATE 100 MG PO CAPS: 100.0000 mg | ORAL_CAPSULE | Freq: Three times a day (TID) | ORAL | Status: DC | PRN

## 2018-10-11 MED ORDER — GUAIFENESIN ER 600 MG PO TB12
600.0000 mg | ORAL_TABLET | Freq: Two times a day (BID) | ORAL | Status: DC
  Administered 2018-10-11 – 2018-10-12 (×2): 600 mg via ORAL
  Filled 2018-10-11 (×2): qty 1

## 2018-10-11 MED ORDER — BUDESONIDE 0.25 MG/2ML IN SUSP
0.2500 mg | Freq: Two times a day (BID) | RESPIRATORY_TRACT | Status: DC
  Administered 2018-10-12: 0.25 mg via RESPIRATORY_TRACT
  Filled 2018-10-11 (×2): qty 2

## 2018-10-11 MED ORDER — INSULIN GLARGINE 100 UNIT/ML ~~LOC~~ SOLN
30.0000 [IU] | Freq: Every day | SUBCUTANEOUS | Status: DC
  Administered 2018-10-11: 30 [IU] via SUBCUTANEOUS
  Filled 2018-10-11 (×2): qty 0.3

## 2018-10-11 MED ORDER — FLUTICASONE PROPIONATE (INHAL) 100 MCG/BLIST IN AEPB: 1.0000 | INHALATION_SPRAY | Freq: Two times a day (BID) | RESPIRATORY_TRACT | Status: DC

## 2018-10-11 MED ORDER — SODIUM CHLORIDE 0.9 % IV BOLUS
1000.0000 mL | Freq: Once | INTRAVENOUS | Status: AC
  Administered 2018-10-11: 1000 mL via INTRAVENOUS

## 2018-10-11 MED ORDER — ASPIRIN 81 MG PO CHEW
81.0000 mg | CHEWABLE_TABLET | Freq: Every day | ORAL | Status: DC
  Administered 2018-10-11 – 2018-10-12 (×2): 81 mg via ORAL
  Filled 2018-10-11 (×2): qty 1

## 2018-10-11 MED ORDER — METFORMIN HCL 500 MG PO TABS
500.0000 mg | ORAL_TABLET | Freq: Two times a day (BID) | ORAL | Status: DC
  Administered 2018-10-11 – 2018-10-12 (×3): 500 mg via ORAL
  Filled 2018-10-11 (×3): qty 1

## 2018-10-11 MED ORDER — INSULIN ASPART 100 UNIT/ML ~~LOC~~ SOLN
0.0000 [IU] | Freq: Three times a day (TID) | SUBCUTANEOUS | Status: DC
  Administered 2018-10-11: 15 [IU] via SUBCUTANEOUS
  Administered 2018-10-12: 3 [IU] via SUBCUTANEOUS
  Administered 2018-10-12: 5 [IU] via SUBCUTANEOUS

## 2018-10-11 MED ORDER — HYDROXYZINE HCL 25 MG PO TABS: 25.0000 mg | ORAL_TABLET | Freq: Three times a day (TID) | ORAL | Status: DC | PRN

## 2018-10-11 MED ORDER — IPRATROPIUM-ALBUTEROL 0.5-2.5 (3) MG/3ML IN SOLN
3.0000 mL | Freq: Once | RESPIRATORY_TRACT | Status: AC
  Administered 2018-10-11: 3 mL via RESPIRATORY_TRACT
  Filled 2018-10-11: qty 3

## 2018-10-11 MED ORDER — SERTRALINE HCL 50 MG PO TABS
50.0000 mg | ORAL_TABLET | Freq: Every day | ORAL | Status: DC
  Administered 2018-10-11 – 2018-10-12 (×2): 50 mg via ORAL
  Filled 2018-10-11 (×2): qty 1

## 2018-10-11 MED ORDER — TRAZODONE HCL 50 MG PO TABS
50.0000 mg | ORAL_TABLET | Freq: Every evening | ORAL | Status: DC | PRN
  Administered 2018-10-11: 50 mg via ORAL
  Filled 2018-10-11: qty 1

## 2018-10-11 NOTE — ED Notes (Addendum)
Pt witnessed changing into hospital scrubs. All personal belongings removed from pt except pts underwear. Belongings placed in bags with labels and given to [place], RN in purple zone. Notified security that pt needed to be wanded.

## 2018-10-11 NOTE — ED Triage Notes (Signed)
EMS stated, He has chest pain, pneumonia, , cough.. Hx of cardiac sx.Marland Kitchen He is also SI

## 2018-10-11 NOTE — ED Notes (Addendum)
Pt reported sharp chest pain that is substernal, pain a 7/10.  Vitals signs are stable, EKG completed and given to Dr. Zenia Resides.

## 2018-10-11 NOTE — ED Notes (Signed)
Ambulated pt w pulse ox.  100% on RA, 93-100 Pulse rate.
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
## 2018-10-11 NOTE — ED Notes (Signed)
Pt in xray, Xray will bring pt to room

## 2018-10-11 NOTE — ED Notes (Signed)
ALL Belongings - 3 labeled bags (including blue backpack) - inventoried - placed in [place] [address] and [place].

## 2018-10-11 NOTE — ED Triage Notes (Signed)
EMS stated, I gave him one ASA, one Nitro, he was hyperglycemic at 465, and smoked crack this morning.Marland Kitchen
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
## 2018-10-11 NOTE — ED Notes (Signed)
Pt arrived to Rm 48 - wearing burgundy scrubs. Sitter w/pt. Pt voiced understanding of Medical Clearance Pt Policy - copy given.

## 2018-10-11 NOTE — Discharge Instructions (Addendum)
You will need to get your kidney labs rechecked in 1 week.

## 2018-10-11 NOTE — ED Notes (Signed)
1:1 Sitter at bedside

## 2018-10-11 NOTE — ED Notes (Signed)
Pt sitting and talking softly w/ sitter in the room. Pt calm and cooperative.  RN introduced self.  Pt states "I'm getting better.... not really sure."

## 2018-10-12 ENCOUNTER — Other Ambulatory Visit: Payer: Self-pay | Admitting: *Deleted

## 2018-10-12 ENCOUNTER — Encounter: Payer: Self-pay | Admitting: *Deleted

## 2018-10-12 LAB — CBG MONITORING, ED
Glucose-Capillary: 176 mg/dL — ABNORMAL HIGH (ref 70–99)
Glucose-Capillary: 224 mg/dL — ABNORMAL HIGH (ref 70–99)
Glucose-Capillary: 117 mg/dL — ABNORMAL HIGH (ref 70–99)

## 2018-10-12 NOTE — Progress Notes (Signed)
Disposition CSW left message for patient Outpatient provider, Fredric Dine, CSW [email or web address] , advising her that he was in the [facility] ED.  Areatha Keas. Judi Cong, MSW, [place] Disposition Clinical Social Work [phone] (cell) [phone] (office)

## 2018-10-12 NOTE — ED Notes (Signed)
Breakfast tray ordered 

## 2018-10-12 NOTE — Progress Notes (Signed)
[facility] reviewing patient information for possible admission per [facility] in Intake and Admission.  Areatha Keas. Judi Cong, MSW, [place] Disposition Clinical Social Work [phone] (cell) [phone] (office)

## 2018-10-12 NOTE — Patient Outreach (Signed)
[facility]) Care Management  10/12/2018  Khoa Opdahl May 17, 1951 [id number]   Noted that member presented to ED with chest pain, cocaine use, and suicidal ideation.  [facility] care manager and CSW has been unsuccessful in contacting member with mulitple attempts since 12/19.  Hospital liaisons notified the member is in the ED, disposition pending.  This care manager will follow up pending disposition.  Valente David, [place], MSN [place] [phone]

## 2018-10-12 NOTE — Progress Notes (Signed)
Pt accepted to [facility]; bed 714-3 Dr. Marcille Blanco is the accepting/attending provider.   Call report to [phone] [name] ED notified.    Pt is voluntary and can be transported by [employer].  Pt may arrive to [facility] as soon as transportation is arranged.   Audree Camel, LCSW, [employer] Disposition [facility]/TTS [phone]

## 2018-10-12 NOTE — Patient Outreach (Signed)
[facility]) Care Management  10/12/2018  Antwine Agosto 05-25-1951 [id number]   CSW will perform a case closure on patient, due to inability to establish initial phone contact with patient, despite required number of phone attempts made and outreach letter mailed to patient's home allowing 10 business days for a response.  CSW will notify patient's RNCM with [place] Management, Valente David of CSW's plans to close patient's case.  CSW will fax an update to patient's Primary Care Physician, Dr. Domenica Fail to ensure that they are aware of CSW's involvement with patient's plan of care.   [name], BSW, MSW, LCSW  Licensed Education officer, [facility]  Mailing [place] [address] [place], [place] [address] Physical Address-[address], [place], [place] [address] Toll Free Main # [phone] Fax # [phone] Cell # [phone]  Office # [phone] [email or web address]

## 2018-10-12 NOTE — ED Notes (Signed)
Patient was given Michael [name] and a Drink.

## 2018-10-12 NOTE — Progress Notes (Signed)
At the request of the patient, CSW left HIPAA compliant voce mail for Ferman Hamming, [employer], [phone]) requesting a call back so that CSW find out if she has received the $900 the patient asserts he paid toward his Section 8 housing.  Areatha Keas. Judi Cong, MSW, [place] Disposition Clinical Social Work [phone] (cell) [phone] (office)

## 2018-10-12 NOTE — ED Notes (Signed)
Regular Diet was ordered for Lunch. 

## 2018-10-12 NOTE — Progress Notes (Signed)
Pt. meets criteria for inpatient treatment per Ricky Ala, NP.  Referred out to the following hospitals:  [facility]  [facility]  [facility]  [facility]     Disposition CSW will continue to follow for placement.  Areatha Keas. Judi Cong, MSW, [place] Disposition Clinical Social Work [phone] (cell) [phone] (office)

## 2018-10-12 NOTE — ED Provider Notes (Signed)
Psychiatric Default Provider Note:   Patient is a 68 year old male with past medical history as below presenting for suicidal ideations.  Per TTS recommendations yesterday, he is awaiting reevaluation by psychiatry today.  He continues to report passive suicidal ideations at this writer today, but has no other complaints.   Past Medical History:  Diagnosis Date  . A-fib ([place])   . Asthma    No PFTs, history of childhood asthma  . CAD (coronary artery disease)    a. 06/2013 STEMI/PCI ([facility]): LAD w/ thrombus (treated with BMS), mid 75%, D2 75%; LCX OM2 75%; RCA small, PDA 95%, PLV 95%;  b. 10/2013 Cath/PCI: ISR w/in LAD (Promus DES x 2), borderline OM2 lesion;  c. 01/2014 MV: Intermediate risk, medium-sized distal ant wall infarct w/ very small amt of peri-infarct ischemia. EF 60%.  . Cellulitis 04/2014   left facial  . Chondromalacia of medial femoral condyle    Left knee MRI 04/28/12: Chondromalacia of the medial femoral condyle with slight peripheral degeneration of the meniscocapsular junction of the medial meniscus; followed by sports medicine  . Collagen vascular disease ([place])   . Crack cocaine use    for 20+ years, has been enrolled in detox programs in the past  . Depression    with history of hospitalization for suicidal ideation  . Diabetes mellitus 2002   Diagnosed in 2002, started insulin in 2012  . Gout   . Gout 04/28/2012  . Headache(784.0)    CT head 08/2011: Periventricular and subcortical white matter hypodensities are most in keeping with chronic microangiopathic change  . HIV infection [facility]) Nov 2012   Followed by [name]  . Hyperlipidemia   . Hypertension   . Pulmonary embolism ([place])    Labs from yesterday reviewed.  Patient is slight bump in creatinine, given normal saline yesterday.  Patient has improved normocytic anemia.  Vital signs reviewed over the past 24 hours and are stable.  Awaiting psychiatry reassessment today.    Albesa Seen, PA-C 10/12/18  1106    Maudie Flakes, MD 10/12/18 1459
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
## 2018-10-13 ENCOUNTER — Other Ambulatory Visit: Payer: Self-pay | Admitting: *Deleted

## 2018-10-13 ENCOUNTER — Telehealth: Payer: Self-pay | Admitting: *Deleted

## 2018-10-13 NOTE — Telephone Encounter (Signed)
Looks like he was transferred to [facility] for psychiatric treatment. It looks like he is homeless so if he receives his mail at [facility] I will leave my card and ask him to please call me.

## 2018-10-13 NOTE — Telephone Encounter (Signed)
I am happy to reach out to him. He looks familiar.

## 2018-10-13 NOTE — Patient Outreach (Signed)
[facility]) Care Management  10/13/2018  Hall Birchard 11/05/1950 [id number]   Noted that member discharged from the ED to [facility] for further psych treatment.  Will close case at this time.    Valente David, [place], MSN [place] [phone]

## 2018-10-13 NOTE — Telephone Encounter (Signed)
Notified of patient recent ED visit and tried to reach out to schedule as he is a patient of record. Was unable to reach by phone. Will send Ambre a message to see if she could track him down and see if there is anything she can help him with.

## 2018-10-14 ENCOUNTER — Telehealth: Payer: Self-pay | Admitting: *Deleted

## 2018-10-14 ENCOUNTER — Ambulatory Visit: Payer: Self-pay | Admitting: *Deleted

## 2018-10-14 DIAGNOSIS — B20 Human immunodeficiency virus [HIV] disease: Secondary | ICD-10-CM

## 2018-10-14 NOTE — Telephone Encounter (Signed)
Attempted to call the numbers listed for the patient and they are all inactive. Reason for the call is to offer assistance with getting the patient back into care with [facility]

## 2018-10-27 ENCOUNTER — Ambulatory Visit: Payer: Medicare Other | Admitting: Infectious Diseases

## 2018-10-28 ENCOUNTER — Other Ambulatory Visit: Payer: Self-pay | Admitting: Infectious Diseases

## 2018-10-28 DIAGNOSIS — B20 Human immunodeficiency virus [HIV] disease: Secondary | ICD-10-CM

## 2018-11-06 NOTE — Progress Notes (Signed)
Referral received for Mr Michael [name] but currently he is homeless and admitted into a mental health facility. Traveled to the [employer] and patient receives his mail at this location. Left a message and my card asking Mr [name] to please give me a call.

## 2018-12-16 ENCOUNTER — Other Ambulatory Visit: Payer: Self-pay

## 2018-12-16 ENCOUNTER — Emergency Department (HOSPITAL_COMMUNITY): Payer: Medicare Other

## 2018-12-16 ENCOUNTER — Encounter (HOSPITAL_COMMUNITY): Payer: Self-pay | Admitting: Emergency Medicine

## 2018-12-16 ENCOUNTER — Emergency Department (HOSPITAL_COMMUNITY)
Admission: EM | Admit: 2018-12-16 | Discharge: 2018-12-17 | Disposition: A | Discharge status: Other Acute Inpatient Hospital | Payer: Medicare Other | Attending: Emergency Medicine | Admitting: Emergency Medicine

## 2018-12-16 DIAGNOSIS — Z79899 Other long term (current) drug therapy: Secondary | ICD-10-CM | POA: Diagnosis not present

## 2018-12-16 DIAGNOSIS — F332 Major depressive disorder, recurrent severe without psychotic features: Secondary | ICD-10-CM | POA: Insufficient documentation

## 2018-12-16 DIAGNOSIS — I10 Essential (primary) hypertension: Secondary | ICD-10-CM | POA: Insufficient documentation

## 2018-12-16 DIAGNOSIS — R45851 Suicidal ideations: Secondary | ICD-10-CM | POA: Diagnosis not present

## 2018-12-16 DIAGNOSIS — R0602 Shortness of breath: Secondary | ICD-10-CM | POA: Diagnosis not present

## 2018-12-16 DIAGNOSIS — B20 Human immunodeficiency virus [HIV] disease: Secondary | ICD-10-CM

## 2018-12-16 DIAGNOSIS — F1420 Cocaine dependence, uncomplicated: Secondary | ICD-10-CM | POA: Diagnosis not present

## 2018-12-16 DIAGNOSIS — E1165 Type 2 diabetes mellitus with hyperglycemia: Secondary | ICD-10-CM | POA: Diagnosis not present

## 2018-12-16 DIAGNOSIS — R0789 Other chest pain: Secondary | ICD-10-CM

## 2018-12-16 DIAGNOSIS — I499 Cardiac arrhythmia, unspecified: Secondary | ICD-10-CM | POA: Diagnosis not present

## 2018-12-16 DIAGNOSIS — R739 Hyperglycemia, unspecified: Secondary | ICD-10-CM

## 2018-12-16 DIAGNOSIS — I491 Atrial premature depolarization: Secondary | ICD-10-CM | POA: Diagnosis not present

## 2018-12-16 DIAGNOSIS — R079 Chest pain, unspecified: Secondary | ICD-10-CM | POA: Diagnosis not present

## 2018-12-16 LAB — RAPID URINE DRUG SCREEN, HOSP PERFORMED
Amphetamines: NOT DETECTED
BARBITURATES: NOT DETECTED
Benzodiazepines: NOT DETECTED
COCAINE: POSITIVE — AB
Opiates: POSITIVE — AB
Tetrahydrocannabinol: NOT DETECTED

## 2018-12-16 LAB — BASIC METABOLIC PANEL
Anion gap: 10 (ref 5–15)
BUN: 36 mg/dL — ABNORMAL HIGH (ref 8–23)
CO2: 22 mmol/L (ref 22–32)
Calcium: 9.2 mg/dL (ref 8.9–10.3)
Chloride: 103 mmol/L (ref 98–111)
Creatinine, Ser: 1.87 mg/dL — ABNORMAL HIGH (ref 0.61–1.24)
GFR calc Af Amer: 42 mL/min — ABNORMAL LOW (ref >60)
GFR calc non Af Amer: 36 mL/min — ABNORMAL LOW (ref >60)
Glucose, Bld: 488 mg/dL — ABNORMAL HIGH (ref 70–99)
Potassium: 4.8 mmol/L (ref 3.5–5.1)
SODIUM: 135 mmol/L (ref 135–145)

## 2018-12-16 LAB — I-STAT TROPONIN, ED
Troponin i, poc: 0 ng/mL (ref 0.00–0.08)
Troponin i, poc: 0 ng/mL (ref 0.00–0.08)

## 2018-12-16 LAB — CBG MONITORING, ED
Glucose-Capillary: 450 mg/dL — ABNORMAL HIGH (ref 70–99)
Glucose-Capillary: 392 mg/dL — ABNORMAL HIGH (ref 70–99)
Glucose-Capillary: 308 mg/dL — ABNORMAL HIGH (ref 70–99)
Glucose-Capillary: 231 mg/dL — ABNORMAL HIGH (ref 70–99)
Glucose-Capillary: 202 mg/dL — ABNORMAL HIGH (ref 70–99)

## 2018-12-16 LAB — CBC
HCT: 34.1 % — ABNORMAL LOW (ref 39.0–52.0)
Hemoglobin: 11.2 g/dL — ABNORMAL LOW (ref 13.0–17.0)
MCH: 26.4 pg (ref 26.0–34.0)
MCHC: 32.8 g/dL (ref 30.0–36.0)
MCV: 80.4 fL (ref 80.0–100.0)
Platelets: 149 10*3/uL — ABNORMAL LOW (ref 150–400)
RBC: 4.24 MIL/uL (ref 4.22–5.81)
RDW: 13.7 % (ref 11.5–15.5)
WBC: 4.9 10*3/uL (ref 4.0–10.5)
nRBC: 0 % (ref 0.0–0.2)

## 2018-12-16 LAB — ETHANOL: Alcohol, Ethyl (B): <10 mg/dL (ref <10)

## 2018-12-16 MED ORDER — ALLOPURINOL 300 MG PO TABS
300.0000 mg | ORAL_TABLET | Freq: Every day | ORAL | Status: DC
  Administered 2018-12-16: 300 mg via ORAL
  Filled 2018-12-16 (×2): qty 1

## 2018-12-16 MED ORDER — SERTRALINE HCL 50 MG PO TABS
50.0000 mg | ORAL_TABLET | Freq: Every day | ORAL | Status: DC
  Administered 2018-12-16: 50 mg via ORAL
  Filled 2018-12-16: qty 1

## 2018-12-16 MED ORDER — INSULIN ASPART 100 UNIT/ML ~~LOC~~ SOLN
0.0000 [IU] | Freq: Three times a day (TID) | SUBCUTANEOUS | Status: DC
  Administered 2018-12-16: 7 [IU] via SUBCUTANEOUS
  Administered 2018-12-17 (×2): 11 [IU] via SUBCUTANEOUS

## 2018-12-16 MED ORDER — SODIUM CHLORIDE 0.9 % IV BOLUS
1000.0000 mL | Freq: Once | INTRAVENOUS | Status: AC
  Administered 2018-12-16: 1000 mL via INTRAVENOUS

## 2018-12-16 MED ORDER — ALUM & MAG HYDROXIDE-SIMETH 200-200-20 MG/5ML PO SUSP: 30.0000 mL | Freq: Four times a day (QID) | ORAL | Status: DC | PRN

## 2018-12-16 MED ORDER — ALBUTEROL SULFATE HFA 108 (90 BASE) MCG/ACT IN AERS: 2.0000 | INHALATION_SPRAY | Freq: Four times a day (QID) | RESPIRATORY_TRACT | Status: DC | PRN

## 2018-12-16 MED ORDER — NICOTINE 21 MG/24HR TD PT24
21.0000 mg | MEDICATED_PATCH | Freq: Every day | TRANSDERMAL | Status: DC
  Administered 2018-12-16 – 2018-12-17 (×2): 21 mg via TRANSDERMAL
  Filled 2018-12-16 (×2): qty 1

## 2018-12-16 MED ORDER — ONDANSETRON HCL 4 MG PO TABS: 4.0000 mg | ORAL_TABLET | Freq: Three times a day (TID) | ORAL | Status: DC | PRN

## 2018-12-16 MED ORDER — ZOLPIDEM TARTRATE 5 MG PO TABS
5.0000 mg | ORAL_TABLET | Freq: Every evening | ORAL | Status: DC | PRN
  Administered 2018-12-17: 5 mg via ORAL
  Filled 2018-12-16: qty 1

## 2018-12-16 MED ORDER — EMTRICITAB-RILPIVIR-TENOFOV AF 200-25-25 MG PO TABS
1.0000 | ORAL_TABLET | Freq: Every day | ORAL | Status: DC
  Administered 2018-12-17: 1 via ORAL
  Filled 2018-12-16: qty 1

## 2018-12-16 MED ORDER — MORPHINE SULFATE (PF) 4 MG/ML IV SOLN
4.0000 mg | Freq: Once | INTRAVENOUS | Status: AC
  Administered 2018-12-16: 4 mg via INTRAVENOUS
  Filled 2018-12-16: qty 1

## 2018-12-16 MED ORDER — ACETAMINOPHEN 325 MG PO TABS: 650.0000 mg | ORAL_TABLET | ORAL | Status: DC | PRN

## 2018-12-16 MED ORDER — INSULIN ASPART 100 UNIT/ML ~~LOC~~ SOLN
5.0000 [IU] | Freq: Once | SUBCUTANEOUS | Status: AC
  Administered 2018-12-16: 5 [IU] via SUBCUTANEOUS

## 2018-12-16 MED ORDER — INSULIN ASPART 100 UNIT/ML ~~LOC~~ SOLN
0.0000 [IU] | Freq: Every day | SUBCUTANEOUS | Status: DC
  Administered 2018-12-16: 2 [IU] via SUBCUTANEOUS

## 2018-12-16 NOTE — ED Notes (Signed)
Pt belongings placed in locker 4

## 2018-12-16 NOTE — ED Notes (Signed)
Pt belongings removed  Pt placed in purple scrubs

## 2018-12-16 NOTE — ED Provider Notes (Signed)
[facility]ERG[facility]Y DEPARTMENT Provider Note   CSN: [id number] Arrival date & time: 12/16/18  0844    History   Chief Complaint Chief Complaint  Patient presents with  . Chest Pain  . Suicidal  . Homicidal    HPI Michael [name] is a 68 y.o. male.     The history is provided by the patient and medical records. No language interpreter was used.     68 year old male with history of HIV currently receiving treatment, CAD, diabetes, polysubstance abuse presenting for evaluation of chest pain.  Patient states since May of last year after he had cardiac surgery he has not felt the same.  He has had persistent pain to his midsternal region at the surgical site.  Pain is a sharp sensation, constant, and has been present since for nearly a year.  Pain is getting progressively worse.  He endorsed some increasing nonproductive cough for the past few days.  He does not complain of any fever, nausea, lightheadedness, dizziness, diaphoresis, shortness of breath or hemoptysis.  He tried resting to alleviate the pain, pain worsening with movement.  He has not discussed this pain with his doctor yet.  History of HIV and states he is compliant with his medication but have not seen his infectious disease specialist in quite some time.  Past Medical History:  Diagnosis Date  . A-fib ([place])   . Asthma    No PFTs, history of childhood asthma  . CAD (coronary artery disease)    a. 06/2013 STEMI/PCI ([facility]): LAD w/ thrombus (treated with BMS), mid 75%, D2 75%; LCX OM2 75%; RCA small, PDA 95%, PLV 95%;  b. 10/2013 Cath/PCI: ISR w/in LAD (Promus DES x 2), borderline OM2 lesion;  c. 01/2014 MV: Intermediate risk, medium-sized distal ant wall infarct w/ very small amt of peri-infarct ischemia. EF 60%.  . Cellulitis 04/2014   left facial  . Chondromalacia of medial femoral condyle    Left knee MRI 04/28/12: Chondromalacia of the medial femoral condyle with slight peripheral degeneration of  the meniscocapsular junction of the medial meniscus; followed by sports medicine  . Collagen vascular disease ([place])   . Crack cocaine use    for 20+ years, has been enrolled in detox programs in the past  . Depression    with history of hospitalization for suicidal ideation  . Diabetes mellitus 2002   Diagnosed in 2002, started insulin in 2012  . Gout   . Gout 04/28/2012  . Headache(784.0)    CT head 08/2011: Periventricular and subcortical white matter hypodensities are most in keeping with chronic microangiopathic change  . HIV infection [facility]) Nov 2012   Followed by [name]  . Hyperlipidemia   . Hypertension   . Pulmonary embolism [facility])     Patient Active Problem List   Diagnosis Date Noted  . Chest pain 10/03/2018  . Malnutrition of moderate degree 09/21/2018  . MDD (major depressive disorder), recurrent episode, moderate ([place])   . Type II diabetes mellitus with renal manifestations ([place]) 05/04/2018  . GERD (gastroesophageal reflux disease) 05/04/2018  . S/P CABG (coronary artery bypass graft)   . Left testicular pain   . Depression 02/20/2018  . Epididymo-orchitis, acute 02/20/2018  . Essential hypertension 02/20/2018  . Precordial chest pain 02/20/2018  . AKI (acute kidney injury) ([place]) 02/14/2018  . HCAP (healthcare-associated pneumonia) 02/14/2018  . History of pulmonary embolism 07/04/2017  . Acute hyponatremia 05/11/2017  . Hyperglycemia due to type 2 diabetes mellitus ([place]) 05/11/2017  .  CHF (congestive heart failure) ([place]) 04/01/2017  . Hepatitis C 12/30/2016  . Chronic ischemic heart disease 11/12/2016  . Paroxysmal A-fib ([place]) 11/12/2016  . Back pain 04/18/2016  . S/P carotid endarterectomy 11/15/2015  . MDD (major depressive disorder), recurrent severe, without psychosis ([place]) 09/09/2015  . Cocaine-induced mood disorder ([place]) 08/14/2015  . Cocaine abuse with cocaine-induced mood disorder ([place]) 08/14/2015  . Gout 07/10/2015  . Acute renal failure  superimposed on stage 3 chronic kidney disease ([place]) 03/06/2015  . Normocytic anemia 03/06/2015  . Chronic kidney disease, stage III (moderate) ([place]) 03/06/2015  . Hypoglycemia   . Encounter for general adult medical examination with abnormal findings 02/09/2015  . Cocaine use disorder, severe, dependence ([place]) 12/13/2014  . Substance or medication-induced depressive disorder with onset during withdrawal ([place]) 12/13/2014  . Severe recurrent major depressive disorder with psychotic features ([place]) 12/12/2014  . Cervicalgia 06/28/2014  . Lumbar radiculopathy, chronic 06/28/2014  . Asthma, chronic 02/03/2014  . 3-vessel CAD 06/24/2013  . ED (erectile dysfunction) of organic origin 07/07/2012  . Hypertension goal BP (blood pressure) < 140/80 04/29/2012  . Chondromalacia of left knee 03/19/2012  . Hyperlipidemia with target LDL less than 100 02/12/2012  . Fibromyalgia 02/12/2012  . HIV (human immunodeficiency virus infection) ([place]) 08/27/2011  . Uncontrolled type 2 diabetes with neuropathy ([place]) 10/17/2000    Past Surgical History:  Procedure Laterality Date  . BACK SURGERY     1988  . BOWEL RESECTION    . CARDIAC SURGERY    . CERVICAL SPINE SURGERY     " rods in my neck "  . CORONARY ARTERY BYPASS GRAFT    . CORONARY STENT PLACEMENT    . NM MYOCAR PERF WALL MOTION  12/27/2011   normal  . SPINE SURGERY          Home Medications    Prior to Admission medications   Medication Sig Start Date End Date Taking? Authorizing Provider  ACCU-CHEK SOFTCLIX LANCETS lancets USE TO CHECK BLOOD GLUCOSE LEVELS UP TO 4 TIMES DAILY AS DIRECTED 11/24/17   Lindell Spar I, NP  albuterol (PROAIR HFA) 108 (90 Base) MCG/ACT inhaler INHALE TWO PUFFS INTO THE LUNGS EVERY 6 (SIX) HOURS AS NEEDED FOR WHEEZING OR SHORTNESS OF BREATH. Patient taking differently: Inhale 2 puffs into the lungs every 6 (six) hours as needed for wheezing or shortness of breath.  11/24/17   Lindell Spar I, NP  allopurinol  (ZYLOPRIM) 300 MG tablet Take 1 tablet (300 mg total) by mouth daily. For gout. Patient taking differently: Take 300 mg by mouth daily.  11/25/17   Lindell Spar I, NP  aspirin 81 MG chewable tablet Chew 81 mg by mouth daily.    [provider]  benzonatate (TESSALON) 100 MG capsule Take 1 capsule (100 mg total) by mouth every 8 (eight) hours. Patient taking differently: Take 100 mg by mouth 3 (three) times daily as needed for cough.  10/07/18   Lavina Hamman, MD  emtricitabine-rilpivir-tenofovir AF (ODEFSEY) 200-25-25 MG TABS tablet Take 1 tablet by mouth daily with breakfast. For HIV infection Patient taking differently: Take 1 tablet by mouth daily with breakfast.  11/24/17   Lindell Spar I, NP  Fluticasone Propionate, Inhal, (FLOVENT DISKUS) 100 MCG/BLIST AEPB Inhale 1 puff into the lungs 2 (two) times daily.    [provider]  gabapentin (NEURONTIN) 400 MG capsule Take 1 capsule (400 mg total) by mouth 2 (two) times daily. For agitation/neuropathic pain Patient not taking: Reported on 10/11/2018 11/24/17  Lindell Spar I, NP  guaiFENesin (MUCINEX) 600 MG 12 hr tablet Take 1 tablet (600 mg total) by mouth 2 (two) times daily. 10/07/18   Lavina Hamman, MD  hydrOXYzine (ATARAX/VISTARIL) 25 MG tablet Take 1 tablet (25 mg total) by mouth 3 (three) times daily as needed for anxiety. 11/24/17   Lindell Spar I, NP  insulin glargine (LANTUS) 100 UNIT/ML injection Inject 0.3 mLs (30 Units total) into the skin at bedtime. For diabetes management 08/04/18   [name], MD  insulin lispro (HUMALOG) 100 UNIT/ML injection Inject 0.05 mLs (5 Units total) into the skin 3 (three) times daily with meals. For diabetes management Patient taking differently: Inject 4 Units into the skin See admin instructions. Inject 4 units into the skin three times a day with meals as needed if BGL is >130-140 11/24/17   Lindell Spar I, NP  Insulin Syringe-Needle U-100 (B-D INS SYR MICROFINE 1CC/27G) 27G X 5/8" 1  ML MISC 1 strip by Does not apply route 3 (three) times daily as needed. For blood sugar checks 11/24/17   Lindell Spar I, NP  metFORMIN (GLUCOPHAGE) 500 MG tablet Take 1 tablet (500 mg total) by mouth 2 (two) times daily with a meal. 08/04/18   [name], MD  sertraline (ZOLOFT) 50 MG tablet Take 1 tablet (50 mg total) by mouth daily. 09/22/18   Lady Deutscher, MD  traZODone (DESYREL) 50 MG tablet Take 1 tablet (50 mg total) by mouth at bedtime. Patient taking differently: Take 50 mg by mouth at bedtime as needed for sleep.  09/21/18   Lady Deutscher, MD    Family History Family History  Problem Relation Age of Onset  . Diabetes Mother   . Hypertension Mother   . Hyperlipidemia Mother   . Diabetes Father   . Cancer Father   . Hypertension Father   . Diabetes Brother   . Heart disease Brother   . Diabetes Sister   . Colon cancer Neg Hx     Social History Social History   Tobacco Use  . Smoking status: Never Smoker  . Smokeless tobacco: Never Used  Substance Use Topics  . Alcohol use: No    Alcohol/week: 4.0 standard drinks    Types: 2 Cans of beer, 2 Shots of liquor per week  . Drug use: Not Currently    Types: "Crack" cocaine, Cocaine    Comment: last use January 2018     Allergies   Patient has no known allergies.   Review of Systems Review of Systems  All other systems reviewed and are negative.    Physical Exam Updated Vital Signs BP 140/80 (BP Location: Left Arm)   Pulse 93   Temp 98.6 F (37 C) (Oral)   Resp 19   Ht 5\' 8"  (1.727 m)   Wt 74.4 kg   SpO2 99%   BMI 24.94 kg/m   Physical Exam Vitals signs and nursing note reviewed.  Constitutional:      General: He is not in acute distress.    Appearance: He is well-developed.  HENT:     Head: Atraumatic.  Eyes:     Conjunctiva/sclera: Conjunctivae normal.  Neck:     Musculoskeletal: Neck supple.  Cardiovascular:     Rate and Rhythm: Normal rate and regular rhythm.  Pulmonary:      Effort: Pulmonary effort is normal.     Breath sounds: Normal breath sounds.  Chest:     Chest wall: Tenderness (Tenderness along  the sternal region with normal-appearing midline surgical scar, no evidence of infection and no evidence of crepitus or emphysema.) present.  Abdominal:     General: Abdomen is flat.     Tenderness: There is no abdominal tenderness.  Skin:    Findings: No rash.  Neurological:     Mental Status: He is alert.      ED Treatments / Results  Labs (all labs ordered are listed, but only abnormal results are displayed) Labs Reviewed  BASIC METABOLIC PANEL - Abnormal; Notable for the following components:      Result Value   Glucose, Bld 488 (*)    BUN 36 (*)    Creatinine, Ser 1.87 (*)    GFR calc non Af Amer 36 (*)    GFR calc Af Amer 42 (*)    All other components within normal limits  CBC - Abnormal; Notable for the following components:   Hemoglobin 11.2 (*)    HCT 34.1 (*)    Platelets 149 (*)    All other components within normal limits  RAPID URINE DRUG SCREEN, HOSP PERFORMED - Abnormal; Notable for the following components:   Opiates POSITIVE (*)    Cocaine POSITIVE (*)    All other components within normal limits  CBG MONITORING, ED - Abnormal; Notable for the following components:   Glucose-Capillary 450 (*)    All other components within normal limits  CBG MONITORING, ED - Abnormal; Notable for the following components:   Glucose-Capillary 392 (*)    All other components within normal limits  CBG MONITORING, ED - Abnormal; Notable for the following components:   Glucose-Capillary 308 (*)    All other components within normal limits  ETHANOL  I-STAT TROPONIN, ED  I-STAT TROPONIN, ED  CBG MONITORING, ED    EKG EKG Interpretation  Date/Time:  Wednesday December 16 2018 08:51:48 EDT Ventricular Rate:  99 PR Interval:    QRS Duration: 90 QT Interval:  332 QTC Calculation: 426 R Axis:   50 Text Interpretation:  Sinus rhythm  Consider left atrial enlargement Confirmed by Veryl Speak [phone]) on 12/16/2018 8:56:46 AM   Radiology Dg Chest Port 1 View  Result Date: 12/16/2018 CLINICAL DATA:  Chest pain and shortness of breath EXAM: PORTABLE CHEST 1 VIEW COMPARISON:  10/11/2018 FINDINGS: Prior median sternotomy and left atrial clipping. Coronary stenting. Stable elevation of the right diaphragm. Improved atelectasis. No effusion or pneumothorax. IMPRESSION: 1. Improving aeration. 2. Chronically elevated right diaphragm. Electronically Signed   By: Monte Fantasia M.D.   On: 12/16/2018 09:16    Procedures Procedures (including critical care time)  Medications Ordered in ED Medications  acetaminophen (TYLENOL) tablet 650 mg (has no administration in time range)  zolpidem (AMBIEN) tablet 5 mg (has no administration in time range)  ondansetron (ZOFRAN) tablet 4 mg (has no administration in time range)  alum & mag hydroxide-simeth (MAALOX/MYLANTA) 200-200-20 MG/5ML suspension 30 mL (has no administration in time range)  nicotine (NICODERM CQ - dosed in mg/24 hours) patch 21 mg (has no administration in time range)  albuterol (PROVENTIL HFA;VENTOLIN HFA) 108 (90 Base) MCG/ACT inhaler 2 puff (has no administration in time range)  allopurinol (ZYLOPRIM) tablet 300 mg (has no administration in time range)  emtricitabine-rilpivir-tenofovir AF (ODEFSEY) 200-25-25 MG per tablet 1 tablet (has no administration in time range)  sertraline (ZOLOFT) tablet 50 mg (has no administration in time range)  insulin aspart (novoLOG) injection 0-20 Units (has no administration in time range)  insulin aspart (novoLOG) injection  0-5 Units (has no administration in time range)  morphine 4 MG/ML injection 4 mg (4 mg Intravenous Given 12/16/18 0928)  sodium chloride 0.9 % bolus 1,000 mL (0 mLs Intravenous Stopped 12/16/18 1118)  insulin aspart (novoLOG) injection 5 Units (5 Units Subcutaneous Given 12/16/18 1126)     Initial Impression /  Assessment and Plan / ED Course  I have reviewed the triage vital signs and the nursing notes.  Pertinent labs & imaging results that were available during my care of the patient were reviewed by me and considered in my medical decision making (see chart for details).        BP (!) 161/92   Pulse 79   Temp 98.6 F (37 C) (Oral)   Resp (!) 21   Ht 5\' 8"  (1.727 m)   Wt 74.4 kg   SpO2 100%   BMI 24.94 kg/m    Final Clinical Impressions(s) / ED Diagnoses   Final diagnoses:  Suicidal ideation  Atypical chest pain  Hyperglycemia    ED Discharge Orders    None     9:16 AM Patient here with persistent midsternal chest pain since open heart surgery nearly a year ago.  Pain is atypical of ACS.  EMS brought patient here, he received aspirin and nitro without any change in his symptoms.  11:16 AM Work-up without any evidence to suggest ACS.  Patient now reporting that he is suicidal.  States that he has been feeling intermittent episodes of suicidality without any specific plan.  He attributed to the state of his health.  He is requesting for help with his suicidal ideation.  He denies HI,  auditory or visual hallucination.  Currently pt is medically cleared and will benefit from TTS assessment.   3:10 PM CBG improves, pt placed on insulin regimen.  Will consult TTS for further psychiatric management.    Domenic Moras, PA-C 12/16/18 1511    Veryl Speak, MD 12/17/18 2003
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
## 2018-12-16 NOTE — ED Notes (Signed)
Dinner tray ordered.

## 2018-12-16 NOTE — Progress Notes (Signed)
Pt meets inpatient criteria per Jinny Blossom, NP. Referral information has been sent to the following hospitals for review: [facility]  [facility]   Disposition will continue to assist with inpatient placement needs.   Audree Camel, LCSW, [facility] Disposition [facility]/TTS [phone]

## 2018-12-16 NOTE — ED Triage Notes (Signed)
Per EMS: pt from home with c/o substernal CP x6 days.  Pain is non-radiating.  Denies N/V/D, fevers, and cough.    EMS administered 324 ASA and 1 Nitro PTA with no relief.

## 2018-12-16 NOTE — ED Notes (Signed)
Pt stating he feels like harming himself; plans to overdose on pills or "crack cocaine", walk out into traffic, or possibly slit wrist. Pt also stating he feels like harming others. When asked about this statement, pt states " I don't know why, I am just tired of feeling this way".

## 2018-12-16 NOTE — ED Notes (Signed)
Pt wanded by security. 

## 2018-12-17 LAB — CBG MONITORING, ED
Glucose-Capillary: 232 mg/dL — ABNORMAL HIGH (ref 70–99)
Glucose-Capillary: 284 mg/dL — ABNORMAL HIGH (ref 70–99)
Glucose-Capillary: 269 mg/dL — ABNORMAL HIGH (ref 70–99)

## 2018-12-17 MED ORDER — EMTRICITAB-RILPIVIR-TENOFOV AF 200-25-25 MG PO TABS: 1.0000 | ORAL_TABLET | Freq: Every day | ORAL | 0 refills | Status: AC

## 2018-12-17 MED FILL — ODEFSEY 200-25-25 MG TABS: 200-25-25 | 30 days supply | Qty: 30 | Fill #0

## 2018-12-17 NOTE — ED Notes (Signed)
EDP at bedside to assess.   

## 2018-12-17 NOTE — ED Notes (Addendum)
Patient signed consent to transfer

## 2018-12-17 NOTE — ED Notes (Addendum)
Lunch tray ordered 

## 2018-12-17 NOTE — ED Notes (Signed)
Pt CBG [id number].

## 2018-12-17 NOTE — ED Notes (Signed)
EMTALA completed, printed and a copy is in PT's d/c envelop. [facility] staff arrived to transport patient. All patient's belongings and HIV meds given to [facility] staff.

## 2018-12-17 NOTE — ED Notes (Signed)
Snack has been given to pt.

## 2018-12-17 NOTE — BH Assessment (Signed)
[place] Assessment Progress Note   At 01:09 patient accepted to [facility] unit.  Accepting physician is Dr. Leatrice Jewels.  Call report to [phone].  Pt can come to [facility] after 09:00 today.  Clinician informed nurse Monique about patient acceptance.

## 2018-12-17 NOTE — TOC Transition Note (Signed)
Transition of Care [facility]) - CM/SW Discharge Note   Patient Details  Name: Michael [name] MRN: [id number] Date of Birth: 1951-01-08  Transition of Care [facility]) CM/SW Contact:  Fuller Mandril, RN Phone Number: 12/17/2018, 1:55 PM   Clinical Narrative:     68 y.o. male.  The pt came in due to having chest pains.  The pt then stated he is suicidal and wants to kill himself.  The pt stated he wants to OD on pills, crack, walk in traffic or cut himself.  The pt stated he is stressed about his declining health.  The pt has made suicide attempts in the past.   Final next [facility] care: [facility] Barriers to Discharge: No Barriers Identified   Patient Goals and CMS Choice Patient states their goals for this hospitalization and ongoing recovery are:: to get stable to go back home      Discharge Placement                       Discharge Plan and Services   Per RN:  Attempted to call report to [facility] [phone] - was advised pt will need to bring own supply of HIV meds. Pt advised he does not have anyone who can go to his apartment to get his meds.               EDCM spoke with EDP to order Rx through [facility] to have delivered to bedside.  EDCM secured co-pay $8.95 from [facility] petty cash drawer.       Social Determinants of Health (SDOH) Interventions     Readmission Risk Interventions No flowsheet data found.

## 2018-12-17 NOTE — ED Notes (Signed)
Attempted to call report to [facility] [phone] - was advised pt will need to bring own supply of HIV meds. Pt advised he does not have anyone who can go to his apartment to get his meds. Conley Simmonds, CM, and [place], SW, assisting.

## 2018-12-17 NOTE — ED Notes (Signed)
Provider notified that patient is ready for transport to [place]

## 2018-12-17 NOTE — ED Notes (Signed)
Handoff report called to [facility]- [phone]. Accepting nurse is E.[name] Cornwall. All questions answered, Nurse is encouraged to call this RN back if further questions arise.

## 2018-12-24 ENCOUNTER — Other Ambulatory Visit: Payer: Self-pay | Admitting: Infectious Diseases

## 2018-12-24 DIAGNOSIS — B20 Human immunodeficiency virus [HIV] disease: Secondary | ICD-10-CM

## 2019-01-10 ENCOUNTER — Other Ambulatory Visit: Payer: Self-pay

## 2019-01-10 ENCOUNTER — Emergency Department (HOSPITAL_COMMUNITY): Payer: Medicare Other

## 2019-01-10 ENCOUNTER — Emergency Department (HOSPITAL_COMMUNITY)
Admission: EM | Admit: 2019-01-10 | Discharge: 2019-01-11 | Disposition: A | Discharge status: Home / Self Care | Payer: Medicare Other | Attending: Emergency Medicine | Admitting: Emergency Medicine

## 2019-01-10 ENCOUNTER — Encounter (HOSPITAL_COMMUNITY): Payer: Self-pay | Admitting: Emergency Medicine

## 2019-01-10 DIAGNOSIS — J45909 Unspecified asthma, uncomplicated: Secondary | ICD-10-CM | POA: Insufficient documentation

## 2019-01-10 DIAGNOSIS — R0789 Other chest pain: Secondary | ICD-10-CM | POA: Diagnosis not present

## 2019-01-10 DIAGNOSIS — I2510 Atherosclerotic heart disease of native coronary artery without angina pectoris: Secondary | ICD-10-CM | POA: Insufficient documentation

## 2019-01-10 DIAGNOSIS — Z7982 Long term (current) use of aspirin: Secondary | ICD-10-CM | POA: Diagnosis not present

## 2019-01-10 DIAGNOSIS — N183 Chronic kidney disease, stage 3 (moderate): Secondary | ICD-10-CM | POA: Insufficient documentation

## 2019-01-10 DIAGNOSIS — Z21 Asymptomatic human immunodeficiency virus [HIV] infection status: Secondary | ICD-10-CM | POA: Insufficient documentation

## 2019-01-10 DIAGNOSIS — Z794 Long term (current) use of insulin: Secondary | ICD-10-CM | POA: Diagnosis not present

## 2019-01-10 DIAGNOSIS — Z951 Presence of aortocoronary bypass graft: Secondary | ICD-10-CM | POA: Diagnosis not present

## 2019-01-10 DIAGNOSIS — I130 Hypertensive heart and chronic kidney disease with heart failure and stage 1 through stage 4 chronic kidney disease, or unspecified chronic kidney disease: Secondary | ICD-10-CM | POA: Insufficient documentation

## 2019-01-10 DIAGNOSIS — E1122 Type 2 diabetes mellitus with diabetic chronic kidney disease: Secondary | ICD-10-CM | POA: Diagnosis not present

## 2019-01-10 DIAGNOSIS — I509 Heart failure, unspecified: Secondary | ICD-10-CM | POA: Insufficient documentation

## 2019-01-10 DIAGNOSIS — F1410 Cocaine abuse, uncomplicated: Secondary | ICD-10-CM | POA: Diagnosis not present

## 2019-01-10 DIAGNOSIS — F32A Depression, unspecified: Secondary | ICD-10-CM

## 2019-01-10 DIAGNOSIS — R079 Chest pain, unspecified: Secondary | ICD-10-CM | POA: Diagnosis not present

## 2019-01-10 DIAGNOSIS — F329 Major depressive disorder, single episode, unspecified: Secondary | ICD-10-CM

## 2019-01-10 DIAGNOSIS — F3341 Major depressive disorder, recurrent, in partial remission: Secondary | ICD-10-CM | POA: Diagnosis not present

## 2019-01-10 DIAGNOSIS — Z79899 Other long term (current) drug therapy: Secondary | ICD-10-CM | POA: Insufficient documentation

## 2019-01-10 DIAGNOSIS — F333 Major depressive disorder, recurrent, severe with psychotic symptoms: Secondary | ICD-10-CM | POA: Insufficient documentation

## 2019-01-10 LAB — BASIC METABOLIC PANEL
Anion gap: 12 (ref 5–15)
BUN: 26 mg/dL — ABNORMAL HIGH (ref 8–23)
CO2: 19 mmol/L — ABNORMAL LOW (ref 22–32)
Calcium: 9 mg/dL (ref 8.9–10.3)
Chloride: 103 mmol/L (ref 98–111)
Creatinine, Ser: 2.2 mg/dL — ABNORMAL HIGH (ref 0.61–1.24)
GFR calc Af Amer: 34 mL/min — ABNORMAL LOW (ref >60)
GFR calc non Af Amer: 30 mL/min — ABNORMAL LOW (ref >60)
Glucose, Bld: 486 mg/dL — ABNORMAL HIGH (ref 70–99)
Potassium: 4.2 mmol/L (ref 3.5–5.1)
Sodium: 134 mmol/L — ABNORMAL LOW (ref 135–145)

## 2019-01-10 LAB — CBC WITH DIFFERENTIAL/PLATELET
Abs Immature Granulocytes: 0.02 10*3/uL (ref 0.00–0.07)
Basophils Absolute: 0 10*3/uL (ref 0.0–0.1)
Basophils Relative: 1 %
Eosinophils Absolute: 0 10*3/uL (ref 0.0–0.5)
Eosinophils Relative: 1 %
HCT: 35.1 % — ABNORMAL LOW (ref 39.0–52.0)
Hemoglobin: 11 g/dL — ABNORMAL LOW (ref 13.0–17.0)
Immature Granulocytes: 1 %
Lymphocytes Relative: 24 %
Lymphs Abs: 0.9 10*3/uL (ref 0.7–4.0)
MCH: 25.3 pg — ABNORMAL LOW (ref 26.0–34.0)
MCHC: 31.3 g/dL (ref 30.0–36.0)
MCV: 80.7 fL (ref 80.0–100.0)
Monocytes Absolute: 0.4 10*3/uL (ref 0.1–1.0)
Monocytes Relative: 9 %
Neutro Abs: 2.6 10*3/uL (ref 1.7–7.7)
Neutrophils Relative %: 64 %
Platelets: 162 10*3/uL (ref 150–400)
RBC: 4.35 MIL/uL (ref 4.22–5.81)
RDW: 14.1 % (ref 11.5–15.5)
WBC: 3.9 10*3/uL — ABNORMAL LOW (ref 4.0–10.5)
nRBC: 0 % (ref 0.0–0.2)

## 2019-01-10 LAB — CBG MONITORING, ED
Glucose-Capillary: 270 mg/dL — ABNORMAL HIGH (ref 70–99)
Glucose-Capillary: 110 mg/dL — ABNORMAL HIGH (ref 70–99)

## 2019-01-10 LAB — ETHANOL: Alcohol, Ethyl (B): <10 mg/dL (ref <10)

## 2019-01-10 LAB — TROPONIN I
Troponin I: <0.03 ng/mL (ref <0.03)
Troponin I: <0.03 ng/mL (ref <0.03)

## 2019-01-10 MED ORDER — BUDESONIDE 0.25 MG/2ML IN SUSP: 0.2500 mg | Freq: Two times a day (BID) | RESPIRATORY_TRACT | Status: DC

## 2019-01-10 MED ORDER — INSULIN GLARGINE 100 UNIT/ML ~~LOC~~ SOLN
30.0000 [IU] | Freq: Every day | SUBCUTANEOUS | Status: DC
  Filled 2019-01-10: qty 0.3

## 2019-01-10 MED ORDER — INSULIN ASPART 100 UNIT/ML ~~LOC~~ SOLN
4.0000 [IU] | Freq: Three times a day (TID) | SUBCUTANEOUS | Status: DC
  Administered 2019-01-11 (×2): 4 [IU] via SUBCUTANEOUS

## 2019-01-10 MED ORDER — SERTRALINE HCL 50 MG PO TABS
50.0000 mg | ORAL_TABLET | Freq: Every day | ORAL | Status: DC
  Administered 2019-01-10 – 2019-01-11 (×2): 50 mg via ORAL
  Filled 2019-01-10 (×2): qty 1

## 2019-01-10 MED ORDER — GUAIFENESIN ER 600 MG PO TB12
600.0000 mg | ORAL_TABLET | Freq: Two times a day (BID) | ORAL | Status: DC
  Administered 2019-01-10 – 2019-01-11 (×2): 600 mg via ORAL
  Filled 2019-01-10 (×2): qty 1

## 2019-01-10 MED ORDER — ASPIRIN 81 MG PO CHEW
81.0000 mg | CHEWABLE_TABLET | Freq: Every day | ORAL | Status: DC
  Administered 2019-01-10 – 2019-01-11 (×2): 81 mg via ORAL
  Filled 2019-01-10 (×2): qty 1

## 2019-01-10 MED ORDER — INSULIN GLARGINE 100 UNIT/ML ~~LOC~~ SOLN
30.0000 [IU] | Freq: Every day | SUBCUTANEOUS | Status: DC
  Filled 2019-01-10 (×2): qty 0.3

## 2019-01-10 MED ORDER — TRAZODONE HCL 50 MG PO TABS: 50.0000 mg | ORAL_TABLET | Freq: Every evening | ORAL | Status: DC | PRN

## 2019-01-10 MED ORDER — FLUTICASONE PROPIONATE (INHAL) 100 MCG/BLIST IN AEPB: 1.0000 | INHALATION_SPRAY | Freq: Two times a day (BID) | RESPIRATORY_TRACT | Status: DC

## 2019-01-10 MED ORDER — INSULIN ASPART 100 UNIT/ML ~~LOC~~ SOLN
10.0000 [IU] | Freq: Once | SUBCUTANEOUS | Status: AC
  Administered 2019-01-10: 10 [IU] via INTRAVENOUS

## 2019-01-10 MED ORDER — METFORMIN HCL 500 MG PO TABS
500.0000 mg | ORAL_TABLET | Freq: Two times a day (BID) | ORAL | Status: DC
  Administered 2019-01-11: 500 mg via ORAL
  Filled 2019-01-10: qty 1

## 2019-01-10 MED ORDER — HYDROXYZINE HCL 25 MG PO TABS: 25.0000 mg | ORAL_TABLET | Freq: Three times a day (TID) | ORAL | Status: DC | PRN

## 2019-01-10 MED ORDER — LORAZEPAM 2 MG/ML IJ SOLN
1.0000 mg | Freq: Once | INTRAMUSCULAR | Status: AC
  Administered 2019-01-10: 1 mg via INTRAVENOUS
  Filled 2019-01-10: qty 1

## 2019-01-10 MED ORDER — SODIUM CHLORIDE 0.9 % IV BOLUS
1000.0000 mL | Freq: Once | INTRAVENOUS | Status: AC
  Administered 2019-01-10: 1000 mL via INTRAVENOUS

## 2019-01-10 NOTE — ED Notes (Signed)
Attempted to get urine sample. Pt was able to hold urinal in hand, however, he kept falling asleep. Will try again later.

## 2019-01-10 NOTE — ED Notes (Signed)
Requested urine from patient, pt too sleepy at this time to provide.

## 2019-01-10 NOTE — ED Notes (Addendum)
Pt arrived to [facility] via stretcher chair. Pt noted to be sleepy - unable to keep eyes open. Pt assisted to bed by staff x 2. Urinal and specimen cup at bedside d/t pt noted to be sleepy. Pt aware of need for urine specimen. Urinal to be removed once pt is more awake as pt may ambulate to bathroom.

## 2019-01-10 NOTE — ED Notes (Signed)
Dinner tray ordered.

## 2019-01-10 NOTE — ED Provider Notes (Signed)
[facility]ERG[facility] DEPARTMENT Provider Note   CSN: [id number] Arrival date & time: 01/10/19  1242    History   Chief Complaint Chief Complaint  Patient presents with  . Chest Pain    HPI Michael [name] is a 68 y.o. male.     HPI   68 year old male with chest pain and depression.  He states that he has chronic chest pain after coronary artery bypass surgery last year.  He has had increased pain after smoking crack today.  Mild dyspnea. No cough. No fever. No chills.  Reports that he is generally compliant with his medications but stopped taking them two days ago because he has lost motivation.   Past Medical History:  Diagnosis Date  . A-fib ([place])   . Asthma    No PFTs, history of childhood asthma  . CAD (coronary artery disease)    a. 06/2013 STEMI/PCI ([facility]): LAD w/ thrombus (treated with BMS), mid 75%, D2 75%; LCX OM2 75%; RCA small, PDA 95%, PLV 95%;  b. 10/2013 Cath/PCI: ISR w/in LAD (Promus DES x 2), borderline OM2 lesion;  c. 01/2014 MV: Intermediate risk, medium-sized distal ant wall infarct w/ very small amt of peri-infarct ischemia. EF 60%.  . Cellulitis 04/2014   left facial  . Chondromalacia of medial femoral condyle    Left knee MRI 04/28/12: Chondromalacia of the medial femoral condyle with slight peripheral degeneration of the meniscocapsular junction of the medial meniscus; followed by sports medicine  . Collagen vascular disease ([place])   . Crack cocaine use    for 20+ years, has been enrolled in detox programs in the past  . Depression    with history of hospitalization for suicidal ideation  . Diabetes mellitus 2002   Diagnosed in 2002, started insulin in 2012  . Gout   . Gout 04/28/2012  . Headache(784.0)    CT head 08/2011: Periventricular and subcortical white matter hypodensities are most in keeping with chronic microangiopathic change  . HIV infection [facility]) Nov 2012   Followed by [name]  . Hyperlipidemia   . Hypertension    . Pulmonary embolism [facility])     Patient Active Problem List   Diagnosis Date Noted  . Chest pain 10/03/2018  . Malnutrition of moderate degree 09/21/2018  . MDD (major depressive disorder), recurrent episode, moderate ([place])   . Type II diabetes mellitus with renal manifestations ([place]) 05/04/2018  . GERD (gastroesophageal reflux disease) 05/04/2018  . S/P CABG (coronary artery bypass graft)   . Left testicular pain   . Depression 02/20/2018  . Epididymo-orchitis, acute 02/20/2018  . Essential hypertension 02/20/2018  . Precordial chest pain 02/20/2018  . AKI (acute kidney injury) ([place]) 02/14/2018  . HCAP (healthcare-associated pneumonia) 02/14/2018  . History of pulmonary embolism 07/04/2017  . Acute hyponatremia 05/11/2017  . Hyperglycemia due to type 2 diabetes mellitus ([place]) 05/11/2017  . CHF (congestive heart failure) ([place]) 04/01/2017  . Hepatitis C 12/30/2016  . Chronic ischemic heart disease 11/12/2016  . Paroxysmal A-fib ([place]) 11/12/2016  . Back pain 04/18/2016  . S/P carotid endarterectomy 11/15/2015  . MDD (major depressive disorder), recurrent severe, without psychosis ([place]) 09/09/2015  . Cocaine-induced mood disorder ([place]) 08/14/2015  . Cocaine abuse with cocaine-induced mood disorder ([place]) 08/14/2015  . Gout 07/10/2015  . Acute renal failure superimposed on stage 3 chronic kidney disease ([place]) 03/06/2015  . Normocytic anemia 03/06/2015  . Chronic kidney disease, stage III (moderate) ([place]) 03/06/2015  . Hypoglycemia   . Encounter for  general adult medical examination with abnormal findings 02/09/2015  . Cocaine use disorder, severe, dependence ([name]) 12/13/2014  . Substance or medication-induced depressive disorder with onset during withdrawal ([place]) 12/13/2014  . Severe recurrent major depressive disorder with psychotic features ([place]) 12/12/2014  . Cervicalgia 06/28/2014  . Lumbar radiculopathy, chronic 06/28/2014  . Asthma, chronic 02/03/2014  . 3-vessel CAD  06/24/2013  . ED (erectile dysfunction) of organic origin 07/07/2012  . Hypertension goal BP (blood pressure) < 140/80 04/29/2012  . Chondromalacia of left knee 03/19/2012  . Hyperlipidemia with target LDL less than 100 02/12/2012  . Fibromyalgia 02/12/2012  . HIV (human immunodeficiency virus infection) ([place]) 08/27/2011  . Uncontrolled type 2 diabetes with neuropathy ([place]) 10/17/2000    Past Surgical History:  Procedure Laterality Date  . BACK SURGERY     1988  . BOWEL RESECTION    . CARDIAC SURGERY    . CERVICAL SPINE SURGERY     " rods in my neck "  . CORONARY ARTERY BYPASS GRAFT    . CORONARY STENT PLACEMENT    . NM MYOCAR PERF WALL MOTION  12/27/2011   normal  . SPINE SURGERY          Home Medications    Prior to Admission medications   Medication Sig Start Date End Date Taking? Authorizing Provider  ACCU-CHEK SOFTCLIX LANCETS lancets USE TO CHECK BLOOD GLUCOSE LEVELS UP TO 4 TIMES DAILY AS DIRECTED 11/24/17   Lindell Spar I, NP  albuterol (PROAIR HFA) 108 (90 Base) MCG/ACT inhaler INHALE TWO PUFFS INTO THE LUNGS EVERY 6 (SIX) HOURS AS NEEDED FOR WHEEZING OR SHORTNESS OF BREATH. Patient taking differently: Inhale 2 puffs into the lungs every 6 (six) hours as needed for wheezing or shortness of breath.  11/24/17   Lindell Spar I, NP  allopurinol (ZYLOPRIM) 300 MG tablet Take 1 tablet (300 mg total) by mouth daily. For gout. Patient taking differently: Take 300 mg by mouth daily.  11/25/17   Lindell Spar I, NP  aspirin 81 MG chewable tablet Chew 81 mg by mouth daily.    [provider]  benzonatate (TESSALON) 100 MG capsule Take 1 capsule (100 mg total) by mouth every 8 (eight) hours. Patient taking differently: Take 100 mg by mouth 3 (three) times daily as needed for cough.  10/07/18   Lavina Hamman, MD  Fluticasone Propionate, Inhal, (FLOVENT DISKUS) 100 MCG/BLIST AEPB Inhale 1 puff into the lungs 2 (two) times daily.    [provider]  gabapentin  (NEURONTIN) 400 MG capsule Take 1 capsule (400 mg total) by mouth 2 (two) times daily. For agitation/neuropathic pain Patient not taking: Reported on 10/11/2018 11/24/17   Lindell Spar I, NP  guaiFENesin (MUCINEX) 600 MG 12 hr tablet Take 1 tablet (600 mg total) by mouth 2 (two) times daily. 10/07/18   Lavina Hamman, MD  hydrOXYzine (ATARAX/VISTARIL) 25 MG tablet Take 1 tablet (25 mg total) by mouth 3 (three) times daily as needed for anxiety. 11/24/17   Lindell Spar I, NP  insulin glargine (LANTUS) 100 UNIT/ML injection Inject 0.3 mLs (30 Units total) into the skin at bedtime. For diabetes management 08/04/18   [name], MD  insulin lispro (HUMALOG) 100 UNIT/ML injection Inject 0.05 mLs (5 Units total) into the skin 3 (three) times daily with meals. For diabetes management Patient taking differently: Inject 4 Units into the skin See admin instructions. Inject 4 units into the skin three times a day with meals as needed if BGL  is >130-140 11/24/17   Lindell Spar I, NP  Insulin Syringe-Needle U-100 (B-D INS SYR MICROFINE 1CC/27G) 27G X 5/8" 1 ML MISC 1 strip by Does not apply route 3 (three) times daily as needed. For blood sugar checks 11/24/17   Lindell Spar I, NP  metFORMIN (GLUCOPHAGE) 500 MG tablet Take 1 tablet (500 mg total) by mouth 2 (two) times daily with a meal. 08/04/18   [name], MD  sertraline (ZOLOFT) 50 MG tablet Take 1 tablet (50 mg total) by mouth daily. 09/22/18   Lady Deutscher, MD  traZODone (DESYREL) 50 MG tablet Take 1 tablet (50 mg total) by mouth at bedtime. Patient taking differently: Take 50 mg by mouth at bedtime as needed for sleep.  09/21/18   Lady Deutscher, MD    Family History Family History  Problem Relation Age of Onset  . Diabetes Mother   . Hypertension Mother   . Hyperlipidemia Mother   . Diabetes Father   . Cancer Father   . Hypertension Father   . Diabetes Brother   . Heart disease Brother   . Diabetes Sister   . Colon cancer Neg  Hx     Social History Social History   Tobacco Use  . Smoking status: Never Smoker  . Smokeless tobacco: Never Used  Substance Use Topics  . Alcohol use: No    Alcohol/week: 4.0 standard drinks    Types: 2 Cans of beer, 2 Shots of liquor per week  . Drug use: Yes    Types: "Crack" cocaine, Cocaine    Comment: last use this morning     Allergies   Patient has no known allergies.   Review of Systems Review of Systems  All systems reviewed and negative, other than as noted in HPI.  Physical Exam Updated Vital Signs BP 119/72 (BP Location: Right Arm)   Pulse 87   Temp 97.9 F (36.6 C) (Oral)   Resp 16   SpO2 100%   Physical Exam Vitals signs and nursing note reviewed.  Constitutional:      General: He is not in acute distress.    Appearance: He is well-developed.  HENT:     Head: Normocephalic and atraumatic.  Eyes:     General:        Right eye: No discharge.        Left eye: No discharge.     Conjunctiva/sclera: Conjunctivae normal.  Neck:     Musculoskeletal: Neck supple.  Cardiovascular:     Rate and Rhythm: Normal rate and regular rhythm.     Heart sounds: Normal heart sounds. No murmur. No friction rub. No gallop.   Pulmonary:     Effort: Pulmonary effort is normal. No respiratory distress.     Breath sounds: Normal breath sounds.  Abdominal:     General: There is no distension.     Palpations: Abdomen is soft.     Tenderness: There is no abdominal tenderness.  Musculoskeletal:        General: No tenderness.     Comments: Lower extremities symmetric as compared to each other. No calf tenderness. Negative Homan's. No palpable cords.   Skin:    General: Skin is warm and dry.  Neurological:     Mental Status: He is alert.  Psychiatric:        Behavior: Behavior normal.        Thought Content: Thought content normal.      ED Treatments / Results  Labs (  all labs ordered are listed, but only abnormal results are displayed) Labs Reviewed  CBC  WITH DIFFERENTIAL/PLATELET - Abnormal; Notable for the following components:      Result Value   WBC 3.9 (*)    Hemoglobin 11.0 (*)    HCT 35.1 (*)    MCH 25.3 (*)    All other components within normal limits  BASIC METABOLIC PANEL - Abnormal; Notable for the following components:   Sodium 134 (*)    CO2 19 (*)    Glucose, Bld 486 (*)    BUN 26 (*)    Creatinine, Ser 2.20 (*)    GFR calc non Af Amer 30 (*)    GFR calc Af Amer 34 (*)    All other components within normal limits  TROPONIN I  RAPID URINE DRUG SCREEN, HOSP PERFORMED  ETHANOL    EKG EKG Interpretation  Date/Time:  Sunday January 10 2019 12:50:57 EDT Ventricular Rate:  93 PR Interval:    QRS Duration: 87 QT Interval:  360 QTC Calculation: 448 R Axis:   79 Text Interpretation:  Sinus arrhythmia Anteroseptal infarct, old Borderline ST elevation, inferior leads Confirmed by Archer Moist ([id number]) on 01/10/2019 1:09:27 PM   Radiology Dg Chest 2 View  Result Date: 01/10/2019 CLINICAL DATA:  Chest pain EXAM: CHEST - 2 VIEW COMPARISON:  12/16/2018; 10/11/2018; 10/02/2018; chest CT-10/02/2018 FINDINGS: Grossly unchanged cardiac silhouette and mediastinal contours post median sternotomy and atrial appendage clipping. Post coronary stent placement. There is unchanged mild elevation the right hemidiaphragm. No focal airspace opacities. No pleural effusion or pneumothorax, no evidence of edema. No acute osseous abnormalities. Post long segment cervical ACDF, incompletely evaluated. IMPRESSION: No acute cardiopulmonary disease. Electronically Signed   By: John  Watts M.D.   On: 01/10/2019 14:00    Procedures Procedures (including critical care time)  Medications Ordered in ED Medications  LORazepam (ATIVAN) injection 1 mg (has no administration in time range)     Initial Impression / Assessment and Plan / ED Course  I have reviewed the triage vital signs and the nursing notes.  Pertinent labs & imaging results that were  available during my care of the patient were reviewed by me and considered in my medical decision making (see chart for details).        68 yM with CP and depression. EKG w/o overt ischemic changes. Reports chronic pain but with underlying CAD, acute component may be from cocaine usage. Initial troponin normal. Will repeat. Medically cleared if remains normal. Hyperglycemia. No increased anion gap. Hx of DM and hasn't taken meds in two days. Given bolus of 1L NS, 10u IV insulin and home lantus, regular insulin and metformin ordered.   5:47 PM medically cleared.   Final Clinical Impressions(s) / ED Diagnoses   Final diagnoses:  Depression, unspecified depression type  Chest pain, unspecified type    ED Discharge Orders    None       Virgel Manifold, MD 01/10/19 1747

## 2019-01-10 NOTE — ED Notes (Signed)
Dinner was given to pt. Asked pt if he would like a few bites. He stated "in a few minutes." Tech will try again later.

## 2019-01-10 NOTE — ED Notes (Signed)
Pt CBG 110. Notified Becky, Therapist, sports.

## 2019-01-10 NOTE — BHH Counselor (Signed)
   BH Progress Note  [place] attempted to complete assessment.  Pt said he "smoked quite a bit of crack" but appeared too lethargic to answer questions.  TTS will follow up at a later time.  Michael [name] L. [place], [place], [facility], [facility] Therapeutic Triage Specialist  [phone]

## 2019-01-10 NOTE — ED Notes (Signed)
Pt states did cocaine this am becaused he was stressed

## 2019-01-10 NOTE — ED Triage Notes (Signed)
Patient reports central chest pain after cocaine use earlier this morning - states he has a drug problem and has this pain after using sometimes. He's not very forthcoming with answers and speaks very softly. Resp e/u, skin w/d.

## 2019-01-11 DIAGNOSIS — F3341 Major depressive disorder, recurrent, in partial remission: Secondary | ICD-10-CM

## 2019-01-11 DIAGNOSIS — R072 Precordial pain: Secondary | ICD-10-CM | POA: Diagnosis not present

## 2019-01-11 DIAGNOSIS — R0789 Other chest pain: Secondary | ICD-10-CM | POA: Diagnosis not present

## 2019-01-11 DIAGNOSIS — R05 Cough: Secondary | ICD-10-CM | POA: Diagnosis not present

## 2019-01-11 LAB — CBG MONITORING, ED
Glucose-Capillary: 113 mg/dL — ABNORMAL HIGH (ref 70–99)
Glucose-Capillary: 131 mg/dL — ABNORMAL HIGH (ref 70–99)
Glucose-Capillary: 205 mg/dL — ABNORMAL HIGH (ref 70–99)

## 2019-01-11 NOTE — ED Provider Notes (Signed)
Blood pressure 125/76, pulse 92, temperature 98.3 F (36.8 C), temperature source Oral, resp. rate 16, SpO2 96 %.   In short, Michael [name] is a 68 y.o. male with a chief complaint of Chest Pain and Suicidal .  Refer to the original H&P for additional details.  Patient evaluated by behavioral health team.  They have cleared the patient for discharge.  Advise keeping follow-up appointment with [facility].  Providing a list of resources at time of discharge. Denies SI/HI at discharge.     Margette Fast, MD 01/11/19 1309

## 2019-01-11 NOTE — Discharge Instructions (Signed)
Substance Abuse Treatment Programs ° °Intensive Outpatient Programs °[facility]     °[address]      °[place], [place]                   °[phone]      ° °[facility] °[address] °[place], [place] °[phone] ° °[facility]     °(Inpatient and outpatient)     °[address]           °[phone]   ° °[facility] °[phone] (Suboxone and Methadone) ° °[address]      °[place], [place] [address]      °[phone]      ° °[address] °[place], [place] °[phone] ° °[facility] (Outpatient/Inpatient, [facility])    °(insurance only) [phone]      °       °[facility] (Groups & Residential) °[place], [place] °[phone] ° °   °[facility]     °[address]     °[place], [place]      °[phone]      ° °[facility] (for caregivers and family) °[address] °[place], [place] °[phone] ° ° ° ° ° °Residential Treatment Programs °[facility]      °[address], [place], [place] [address]  °[phone]      ° °[facility] °[address] [place], [place] [address] °[phone] ° °[facility]        °[phone]      ° °[facility] °[phone] ° °[facility]             °[address]                                         °[place], [place]                                                °[phone] or [phone]                              ° °[facility] [place] °[address] °[place], [address] °[phone] ° °[facility]    °[address]      °[place], [place]     °[phone]      ° °[facility] °[address] °[place], [place] °[phone] ° °[facility]   °[address]     °[place], [place] [address]     °[phone]      °Admissions: 8am-3pm M-F ° °[facility] °[address] °[place],  [place] °[phone] ° °[facility]: Residential Program (90 Days)   °[place], [place]      °[phone] or [phone]    ° °[facility] °[place], [place] °(Walk in Hours over the weekend or by referral) ° °[facility] °[address], [place],  [address] °[phone] ° °Crisis Mobile: [facility]:  1-877-626-1772 (for crisis response 24 hours a day) °[facility] Hotline:      1-800-256-2452 °Outpatient Psychiatry and Counseling ° °[facility]: Mobile Crisis   Management 24 hours:  1-877-626-1772 ° °[facility] sliding scale fee and walk in schedule: M-F 8am-12pm/1pm-3pm °[address]  °[place], [place] [address] °[phone] ° °[facility] °[address] °[place], [place] [address] °[phone] ° °[facility] (Formerly known [facility])- new patient walk-in appointments available Monday - Friday 8am -3pm.          °[address] °[place], [place] [address] °336-676-6840 or crisis line- 336-676-6905 ° °[facility]/ Intensive Outpatient Therapy Program °[address] °[place], [place] [address] °336-832-9804 ° °[facility]                  °Crisis Services      °[phone]      °[address]     °[name], [place] [address]                ° °[facility]   °[facility] °[phone] °[address] °[place], [place] [address] ° ° °[facility]          °[address]  °[place], [place] [address]       °[phone] ° °[facility] °[address] °[place], [place] [address] °336-292-1510 ° °[facility]    °[address]    °[place], [place] [address]     °[phone]      ° °Parish McKinney, MD     °[address]     °[place] [address]     °[phone]     °  °[facility] °[address] °[place] [address] ° °[facility]     °[address]     °[place], [place]      °336-542-2076      ° °[facility] °Shamsher Ahluwalia, MD °[address] °[place], [place] [address] °[phone] ° °[facility]     °[address]     °[place], [place] [address]     °336-274-1237      ° °[facility] °[address] °[place], [place] [address] °336-854-4450 °Resources for Temporary Residential Assistance/Crisis Centers ° °DAY CENTERS °[facility] °M-F 8am-3pm   °[address] [place], [place] [address]   336-332-0824 °Services include: laundry, barbering, support groups, case management, phone  & computer access, showers, AA/NA mtgs, mental health/substance abuse nurse, job skills class, disability information, VA assistance, spiritual classes, etc.  ° °HOMELESS SHELTERS ° °[facility]     °[facility]   °[address], [place]     °[phone]       °       °[facility] (women and children)       °[address] °[place], [place] [address] °336-275-0820 °[email or web address] for application and process °Application Required ° °[facility]   °[address]    °[place] [address]     °[phone]       °             °[facility] °[address] °[place], [place] [address] °[phone] °336-235-0363(schedule application appt.) °Application Required ° °[facility] (women only)    °[address]     °[place],  [address]     °336-884-1039      °  Intake starts 6pm daily °Need valid ID, SSC, & Police report °[facility] [place] °[address] °[place], [place] °336-881-5420 °Application Required ° °[facility] (men only)     °[address]      °[place], [place]     °[phone]      ° °[facility] °(Pregnant women only) °[address] °[place], [place] °336-275-0206 ° °[facility]      °[address]      °[place], [place] [address]     °[phone]      °       °[facility] [place] [facility] °[address] °[place], [place] °336-723-1848 °90 day commitment/SA/Application process ° °[facility](men only)     °[address]     °[place], [place]     °[phone]       °Check-in at 7pm     °       °[facility] °[address] °[place], [place] [address] °336-248-6684 °Men/Women/Women and Children must be there by 7 pm ° °[facility] °[place], [place] °336-722-8721                ° °

## 2019-01-11 NOTE — Consult Note (Signed)
Telepsych Consultation   Reason for Consult:  Depression and suicidal ideation  Referring Physician:  EPD  Location of Patient:  Location of Provider: [facility]  Patient Identification: Dayshon Roback MRN:  [id number] Principal Diagnosis: <principal problem not specified> Diagnosis:  Active Problems:   * No active hospital problems. *   Total Time spent with patient: 15 minutes  Subjective:   Gregorey Nabor is a 68 y.o. male was reassessed via tele -assessment. Arish reported " I have a lot of problems." stated his main stressor is current living situation. Stated he was advised to follow up with [facility], however hasn't made an appointment. Reports struggling with substance use, which is why is doesn't want to go back home. Stated he feels his apartment complex has drugs and states he doesn't want to live there any longer. NP will follow-up with Social worker for additional housing resources. Case staffed with MD Dwyane Dee, patient to keep all outpatient follow-up appointments.  Support, encouragement and reassurance was provided.    HPI:  Per assessment note: Keanan Melander is an 68 y.o. male who presented to [name] after having been on a cocaine binge with complaints of chest pain and seeking help for his depression and suicidal ideation.  Patient is well known to [facility] with three previous psychiatric hospitalizations since December 2019 at [facility], [place] and [place] for similar presentations.  Patient has a hong history of cocaine use beginning at age 84.  He claims that he was clean for four months prior to his recent relapse.  However, his history of drug screens taken in the ED does not substantiate that he has experienced any clean time.  Patient states that over the past few days that he has used $1000 worth of cocaine.  He states that he has been depressed and not taking his medication for his depression and medical issues. He states that  he is currently having suicidal thoughts of walking into traffic or cutting his wrists and states that he has attempted suicide on at least three occasions in the past. Patient states that he has been in residential treatment for his addiction several times, but it has been several years ago since he has been in treatment.  Patient denies HI, but states that he experiences auditory hallucinations of voices that tell him that he is no good and to do "this and that."  Patient states that he is experiencing sleep disturbance and he is only sleeping two hours per night and states that he has not been eating well and feels like he has lost between 10-15 pounds. He denies any history of abuse or self-mutilation.  Patient states that he is widowed.  He has two grown children and he is on disability.  Patient states that he had recent triple bypass surgery, but he has continued to use cocaine.  Patient states [place]  However, patient states that this is a high risk environment for him and that there is a lot of drug use there as well as prostitutes and patient states, "I cannot go back there."  Patient has burned many bridges with his family and he has little support due to his drug addiction.   Past Psychiatric History:   Risk to Self: Suicidal Ideation: Yes-Currently Present Suicidal Intent: Yes-Currently Present Is patient at risk for suicide?: Yes Suicidal Plan?: Yes-Currently Present Specify Current Suicidal Plan: walk out into traffic Access to Means:  Yes Specify Access to Suicidal Means: streets/traffic What has been your use of drugs/alcohol within the last 12 months?: crack cocaine use How many times?: 3 Other Self Harm Risks: minimal support Triggers for Past Attempts: None known Intentional Self Injurious Behavior: None Risk to Others: Homicidal Ideation: No Thoughts of Harm to Others: No Current Homicidal Intent: No Current Homicidal  Plan: No Access to Homicidal Means: No Identified Victim: none History of harm to others?: No Assessment of Violence: None Noted Violent Behavior Description: none Does patient have access to weapons?: No Criminal Charges Pending?: No Does patient have a court date: No Prior Inpatient Therapy: Prior Inpatient Therapy: Yes Prior Therapy Dates: 09/2018, 10/2018, 12/2018 Prior Therapy Facilty/Provider(s): [facility] Reason for Treatment: SI Prior Outpatient Therapy: Prior Outpatient Therapy: Yes Prior Therapy Dates: active Prior Therapy Facilty/Provider(s): [facility] Reason for Treatment: depression Does patient have an ACCT team?: No Does patient have Intensive In-House Services?  : No Does patient have [facility] services? : No Does patient have P4CC services?: No  Past Medical History:  Past Medical History:  Diagnosis Date  . A-fib ([place])   . Asthma    No PFTs, history of childhood asthma  . CAD (coronary artery disease)    a. 06/2013 STEMI/PCI ([facility]): LAD w/ thrombus (treated with BMS), mid 75%, D2 75%; LCX OM2 75%; RCA small, PDA 95%, PLV 95%;  b. 10/2013 Cath/PCI: ISR w/in LAD (Promus DES x 2), borderline OM2 lesion;  c. 01/2014 MV: Intermediate risk, medium-sized distal ant wall infarct w/ very small amt of peri-infarct ischemia. EF 60%.  . Cellulitis 04/2014   left facial  . Chondromalacia of medial femoral condyle    Left knee MRI 04/28/12: Chondromalacia of the medial femoral condyle with slight peripheral degeneration of the meniscocapsular junction of the medial meniscus; followed by sports medicine  . Collagen vascular disease ([place])   . Crack cocaine use    for 20+ years, has been enrolled in detox programs in the past  . Depression    with history of hospitalization for suicidal ideation  . Diabetes mellitus 2002   Diagnosed in 2002, started insulin in 2012  . Gout   . Gout 04/28/2012  . Headache(784.0)    CT head 08/2011: Periventricular and subcortical  white matter hypodensities are most in keeping with chronic microangiopathic change  . HIV infection [facility]) Nov 2012   Followed by [name]  . Hyperlipidemia   . Hypertension   . Pulmonary embolism [facility])     Past Surgical History:  Procedure Laterality Date  . BACK SURGERY     1988  . BOWEL RESECTION    . CARDIAC SURGERY    . CERVICAL SPINE SURGERY     " rods in my neck "  . CORONARY ARTERY BYPASS GRAFT    . CORONARY STENT PLACEMENT    . NM MYOCAR PERF WALL MOTION  12/27/2011   normal  . SPINE SURGERY     Family History:  Family History  Problem Relation Age of Onset  . Diabetes Mother   . Hypertension Mother   . Hyperlipidemia Mother   . Diabetes Father   . Cancer Father   . Hypertension Father   . Diabetes Brother   . Heart disease Brother   . Diabetes Sister   . Colon cancer Neg Hx    Family Psychiatric  History:  Social History:  Social History   Substance and Sexual Activity  Alcohol Use No  . Alcohol/week: 4.0  standard drinks  . Types: 2 Cans of beer, 2 Shots of liquor per week     Social History   Substance and Sexual Activity  Drug Use Yes  . Types: "Crack" cocaine, Cocaine   Comment: last use this morning    Social History   Socioeconomic History  . Marital status: Widowed    Spouse name: Not on file  . Number of children: 2  . Years of education: 30  . Highest education level: 12th grade  Occupational History    Employer: UNEMPLOYED    Comment: 04/2016  Social Needs  . Financial resource strain: Somewhat hard  . Food insecurity:    Worry: Sometimes true    Inability: Sometimes true  . Transportation needs:    Medical: Yes    Non-medical: Yes  Tobacco Use  . Smoking status: Never Smoker  . Smokeless tobacco: Never Used  Substance and Sexual Activity  . Alcohol use: No    Alcohol/week: 4.0 standard drinks    Types: 2 Cans of beer, 2 Shots of liquor per week  . Drug use: Yes    Types: "Crack" cocaine, Cocaine    Comment: last use  this morning  . Sexual activity: Yes    Comment: accepted condoms  Lifestyle  . Physical activity:    Days per week: 0 days    Minutes per session: Not on file  . Stress: Very much  Relationships  . Social connections:    Talks on phone: Once a week    Gets together: Never    Attends religious service: Never    Active member of club or organization: No    Attends meetings of clubs or organizations: Never    Relationship status: Widowed  Other Topics Concern  . Not on file  Social History Narrative   Currently staying with a friend in [place].  Was staying @ local motel until a few days ago - left b/c of bed bugs.   Additional Social History:    Allergies:  No Known Allergies  Labs:  Results for orders placed or performed during the hospital encounter of 01/10/19 (from the past 48 hour(s))  CBC with Differential     Status: Abnormal   Collection Time: 01/10/19 12:59 PM  Result Value Ref Range   WBC 3.9 (L) 4.0 - 10.5 K/uL   RBC 4.35 4.22 - 5.81 MIL/uL   Hemoglobin 11.0 (L) 13.0 - 17.0 g/dL   HCT 35.1 (L) 39.0 - 52.0 %   MCV 80.7 80.0 - 100.0 fL   MCH 25.3 (L) 26.0 - 34.0 pg   MCHC 31.3 30.0 - 36.0 g/dL   RDW 14.1 11.5 - 15.5 %   Platelets 162 150 - 400 K/uL   nRBC 0.0 0.0 - 0.2 %   Neutrophils Relative % 64 %   Neutro Abs 2.6 1.7 - 7.7 K/uL   Lymphocytes Relative 24 %   Lymphs Abs 0.9 0.7 - 4.0 K/uL   Monocytes Relative 9 %   Monocytes Absolute 0.4 0.1 - 1.0 K/uL   Eosinophils Relative 1 %   Eosinophils Absolute 0.0 0.0 - 0.5 K/uL   Basophils Relative 1 %   Basophils Absolute 0.0 0.0 - 0.1 K/uL   Immature Granulocytes 1 %   Abs Immature Granulocytes 0.02 0.00 - 0.07 K/uL    Comment: Performed at [facility], [address], [place], [place] [address]  Troponin I - ONCE - STAT     Status: None  Collection Time: 01/10/19 12:59 PM  Result Value Ref Range   Troponin I <0.03 <0.03 ng/mL    Comment: Performed at [facility], [place] [address],  [place], [place] [address]  Basic metabolic panel     Status: Abnormal   Collection Time: 01/10/19 12:59 PM  Result Value Ref Range   Sodium 134 (L) 135 - 145 mmol/L   Potassium 4.2 3.5 - 5.1 mmol/L   Chloride 103 98 - 111 mmol/L   CO2 19 (L) 22 - 32 mmol/L   Glucose, Bld 486 (H) 70 - 99 mg/dL   BUN 26 (H) 8 - 23 mg/dL   Creatinine, Ser 2.20 (H) 0.61 - 1.24 mg/dL   Calcium 9.0 8.9 - 10.3 mg/dL   GFR calc non Af Amer 30 (L) >60 mL/min   GFR calc Af Amer 34 (L) >60 mL/min   Anion gap 12 5 - 15    Comment: Performed at [place] [address] [place], [place] [address]  Ethanol     Status: None   Collection Time: 01/10/19  1:52 PM  Result Value Ref Range   Alcohol, Ethyl (B) <10 <10 mg/dL    Comment: (NOTE) Lowest detectable limit for serum alcohol is 10 mg/dL. For medical purposes only. Performed at [facility], [place] [address] [place], [place] [address]   CBG monitoring, ED     Status: Abnormal   Collection Time: 01/10/19  4:12 PM  Result Value Ref Range   Glucose-Capillary 270 (H) 70 - 99 mg/dL   Comment 1 Notify RN    Comment 2 Document in Chart   Troponin I - Once-Timed     Status: None   Collection Time: 01/10/19  4:21 PM  Result Value Ref Range   Troponin I <0.03 <0.03 ng/mL    Comment: Performed at [facility], [place] [address], [place], [place] [address]  CBG monitoring, ED     Status: Abnormal   Collection Time: 01/10/19  5:53 PM  Result Value Ref Range   Glucose-Capillary 110 (H) 70 - 99 mg/dL  CBG monitoring, ED     Status: Abnormal   Collection Time: 01/10/19  9:37 PM  Result Value Ref Range   Glucose-Capillary 113 (H) 70 - 99 mg/dL  CBG monitoring, ED     Status: Abnormal   Collection Time: 01/11/19  7:22 AM  Result Value Ref Range   Glucose-Capillary 131 (H) 70 - 99 mg/dL    Medications:  Current Facility-Administered Medications  Medication Dose Route Frequency Provider Last Rate Last Dose  . aspirin chewable tablet 81 mg  81 mg  Oral Daily Virgel Manifold, MD   81 mg at 01/11/19 0929  . budesonide (PULMICORT) nebulizer solution 0.25 mg  0.25 mg Nebulization BID Virgel Manifold, MD      . guaiFENesin [facility]) 12 hr tablet 600 mg  600 mg Oral BID Virgel Manifold, MD   600 mg at 01/11/19 4656  . hydrOXYzine (ATARAX/VISTARIL) tablet 25 mg  25 mg Oral TID PRN Virgel Manifold, MD      . insulin aspart (novoLOG) injection 4 Units  4 Units Subcutaneous TID WC Virgel Manifold, MD   4 Units at 01/11/19 [phone]  . insulin glargine (LANTUS) injection 30 Units  30 Units Subcutaneous QHS Virgel Manifold, MD      . metFORMIN (GLUCOPHAGE) tablet 500 mg  500 mg Oral BID WC Virgel Manifold, MD   500 mg at 01/11/19 0740  .  sertraline (ZOLOFT) tablet 50 mg  50 mg Oral Daily Virgel Manifold, MD   50 mg at 01/11/19 0929  . traZODone (DESYREL) tablet 50 mg  50 mg Oral QHS PRN Virgel Manifold, MD       Current Outpatient Medications  Medication Sig Dispense Refill  . ACCU-CHEK SOFTCLIX LANCETS lancets USE TO CHECK BLOOD GLUCOSE LEVELS UP TO 4 TIMES DAILY AS DIRECTED 100 each 5  . albuterol (PROAIR HFA) 108 (90 Base) MCG/ACT inhaler INHALE TWO PUFFS INTO THE LUNGS EVERY 6 (SIX) HOURS AS NEEDED FOR WHEEZING OR SHORTNESS OF BREATH. (Patient taking differently: Inhale 2 puffs into the lungs every 6 (six) hours as needed for wheezing or shortness of breath. ) 8.5 Inhaler 0  . allopurinol (ZYLOPRIM) 300 MG tablet Take 1 tablet (300 mg total) by mouth daily. For gout. (Patient taking differently: Take 300 mg by mouth daily. ) 30 tablet 0  . aspirin 81 MG chewable tablet Chew 81 mg by mouth daily.    . benzonatate (TESSALON) 100 MG capsule Take 1 capsule (100 mg total) by mouth every 8 (eight) hours. (Patient taking differently: Take 100 mg by mouth 3 (three) times daily as needed for cough. ) 21 capsule 0  . Fluticasone Propionate, Inhal, (FLOVENT DISKUS) 100 MCG/BLIST AEPB Inhale 1 puff into the lungs 2 (two) times daily.    Marland Kitchen gabapentin (NEURONTIN) 400 MG  capsule Take 1 capsule (400 mg total) by mouth 2 (two) times daily. For agitation/neuropathic pain (Patient not taking: Reported on 10/11/2018) 60 capsule 0  . guaiFENesin (MUCINEX) 600 MG 12 hr tablet Take 1 tablet (600 mg total) by mouth 2 (two) times daily. 30 tablet 0  . hydrOXYzine (ATARAX/VISTARIL) 25 MG tablet Take 1 tablet (25 mg total) by mouth 3 (three) times daily as needed for anxiety. 90 tablet 0  . insulin glargine (LANTUS) 100 UNIT/ML injection Inject 0.3 mLs (30 Units total) into the skin at bedtime. For diabetes management 10 mL 0  . insulin lispro (HUMALOG) 100 UNIT/ML injection Inject 0.05 mLs (5 Units total) into the skin 3 (three) times daily with meals. For diabetes management (Patient taking differently: Inject 4 Units into the skin See admin instructions. Inject 4 units into the skin three times a day with meals as needed if BGL is >130-140) 10 mL 0  . Insulin Syringe-Needle U-100 (B-D INS SYR MICROFINE 1CC/27G) 27G X 5/8" 1 ML MISC 1 strip by Does not apply route 3 (three) times daily as needed. For blood sugar checks 100 each 0  . metFORMIN (GLUCOPHAGE) 500 MG tablet Take 1 tablet (500 mg total) by mouth 2 (two) times daily with a meal. 60 tablet 6  . sertraline (ZOLOFT) 50 MG tablet Take 1 tablet (50 mg total) by mouth daily. 30 tablet 0  . traZODone (DESYREL) 50 MG tablet Take 1 tablet (50 mg total) by mouth at bedtime. (Patient taking differently: Take 50 mg by mouth at bedtime as needed for sleep. ) 30 tablet 0    Musculoskeletal: Strength & Muscle Tone: N/A Gait & Station: N/A Patient leans: N/A  Psychiatric Specialty Exam: Physical Exam  Nursing note and vitals reviewed. Constitutional: He appears well-developed.  Neurological: He is alert.  Psychiatric: He has a normal mood and affect. His behavior is normal.    ROS  Blood pressure (!) 131/91, pulse 98, temperature 98.4 F (36.9 C), resp. rate 18, SpO2 98 %.There is no height or weight on file to calculate BMI.   General Appearance: Casual  Eye Contact:  Fair  Speech:  Clear and Coherent  Volume:  Normal  Mood:  Depressed  Affect:  Congruent  Thought Process:  Coherent  Orientation:  Full (Time, Place, and Person)  Thought Content:  Logical  Suicidal Thoughts:  Yes.  without intent/plan  Homicidal Thoughts:  No  Memory:  Immediate;   Fair Recent;   Good  Judgement:  Fair  Insight:  Fair  Psychomotor Activity:  Normal  Concentration:  Concentration: Fair  Recall:  [employer] of Knowledge:  Fair  Language:  Fair  Akathisia:  No  Handed:  Right  AIMS (if indicated):     Assets:  Financial Resources/Insurance Housing  ADL's:  Intact  Cognition:  WNL  Sleep:        Treatment Plan Summary: -NP spoke to MD  Isacc regarding recommendation for discharge - Keep follow appointment with [facility]  -CSW to follow for additional resources for housing   Disposition: No evidence of imminent risk to self or others at present.   Patient does not meet criteria for psychiatric inpatient admission. Supportive therapy provided about ongoing stressors. Refer to IOP. Discussed crisis plan, support from social network, calling 911, coming to the Emergency Department, and calling Suicide Hotline.  This service was provided via telemedicine using a 2-way, interactive audio and video technology.  Names of all persons participating in this telemedicine service and their role in this encounter. Name: [name]  Role: patient   Name: T,lewis Role: NP          Derrill Center, NP 01/11/2019 10:13 AM

## 2019-01-11 NOTE — ED Notes (Signed)
Patient was given Cookies and Drink for a snack.

## 2019-01-11 NOTE — ED Notes (Signed)
Breakfast tray ordered 

## 2019-01-11 NOTE — ED Notes (Signed)
Regular Diet was ordered for Lunch. 

## 2019-01-11 NOTE — BH Assessment (Addendum)
Tele Assessment Note   Patient Name: Michael [name] MRN: [id number] Referring Physician: Wilson Singer Location of Patient: [facility] Location of Provider: [place] [facility]  Michael [name] is an 68 y.o. male who presented to [facility] ED after having been on a cocaine binge with complaints of chest pain and seeking help for his depression and suicidal ideation.  Patient is well known to [facility] with three previous psychiatric hospitalizations since December 2019 at [facility], [place] and [place] for similar presentations.  Patient has a hong history of cocaine use beginning at age 56.  He claims that he was clean for four months prior to his recent relapse.  However, his history of drug screens taken in the ED does not substantiate that he has experienced any clean time.  Patient states that over the past few days that he has used $1000 worth of cocaine.  He states that he has been depressed and not taking his medication for his depression and medical issues. He states that he is currently having suicidal thoughts of walking into traffic or cutting his wrists and states that he has attempted suicide on at least three occasions in the past. Patient states that he has been in residential treatment for his addiction several times, but it has been several years ago since he has been in treatment.  Patient denies HI, but states that he experiences auditory hallucinations of voices that tell him that he is no good and to do "this and that."  Patient states that he is experiencing sleep disturbance and he is only sleeping two hours per night and states that he has not been eating well and feels like he has lost between 10-15 pounds. He denies any history of abuse or self-mutilation.  Patient states that he is widowed.  He has two grown children and he is on disability.  Patient states that he had recent triple bypass surgery, but he has continued to use cocaine.  Patient states that [place]  [place]  However, patient states that this is a high risk environment for him and that there is a lot of drug use there as well as prostitutes and patient states, "I cannot go back there."  Patient has burned many bridges with his family and he has little support due to his drug addiction.  Patient presents as alert and oriented.  His mood depressed and his affect flat.  His thoughts are organized and his memory intact.  His judgment, insight and impulse control are impaired.  He is not currently responding to internal stimuli.  His speech is clear, but soft and his eye contact good.  His psycho-motor activity is unremarkable.   Patient has burned all of his bridges with his family and could not provide TTS with a family member who could provide collateral information. He has minin=mal contact with his family due to his addiction.  Diagnosis: F33.3 MDD Recurrent Severe with Psychosis, F14.20 Cocaine Use Disorder Severe  Past Medical History:  Past Medical History:  Diagnosis Date  . A-fib ([place])   . Asthma    No PFTs, history of childhood asthma  . CAD (coronary artery disease)    a. 06/2013 STEMI/PCI ([facility]): LAD w/ thrombus (treated with BMS), mid 75%, D2 75%; LCX OM2 75%; RCA small, PDA 95%, PLV 95%;  b. 10/2013 Cath/PCI: ISR w/in LAD (Promus DES x 2), borderline OM2 lesion;  c. 01/2014 MV: Intermediate risk, medium-sized distal ant wall infarct  w/ very small amt of peri-infarct ischemia. EF 60%.  . Cellulitis 04/2014   left facial  . Chondromalacia of medial femoral condyle    Left knee MRI 04/28/12: Chondromalacia of the medial femoral condyle with slight peripheral degeneration of the meniscocapsular junction of the medial meniscus; followed by sports medicine  . Collagen vascular disease ([place])   . Crack cocaine use    for 20+ years, has been enrolled in detox programs in the past  . Depression    with history of hospitalization for suicidal ideation  .  Diabetes mellitus 2002   Diagnosed in 2002, started insulin in 2012  . Gout   . Gout 04/28/2012  . Headache(784.0)    CT head 08/2011: Periventricular and subcortical white matter hypodensities are most in keeping with chronic microangiopathic change  . HIV infection [facility]) Nov 2012   Followed by [name]  . Hyperlipidemia   . Hypertension   . Pulmonary embolism [facility])     Past Surgical History:  Procedure Laterality Date  . BACK SURGERY     1988  . BOWEL RESECTION    . CARDIAC SURGERY    . CERVICAL SPINE SURGERY     " rods in my neck "  . CORONARY ARTERY BYPASS GRAFT    . CORONARY STENT PLACEMENT    . NM MYOCAR PERF WALL MOTION  12/27/2011   normal  . SPINE SURGERY      Family History:  Family History  Problem Relation Age of Onset  . Diabetes Mother   . Hypertension Mother   . Hyperlipidemia Mother   . Diabetes Father   . Cancer Father   . Hypertension Father   . Diabetes Brother   . Heart disease Brother   . Diabetes Sister   . Colon cancer Neg Hx     Social History:  reports that he has never smoked. He has never used smokeless tobacco. He reports current drug use. Drugs: "Crack" cocaine and Cocaine. He reports that he does not drink alcohol.  Additional Social History:  Alcohol / Drug Use Pain Medications: See MAR Prescriptions: See MAR Over the Counter: See MAR History of alcohol / drug use?: Yes Longest period of sobriety (when/how long): prior to his recent relapse, patient states that he was clean for four months Substance #1 Name of Substance 1: crack cocaine 1 - Age of First Use: 41 1 - Amount (size/oz): $1000 this week 1 - Frequency: 3 day binge 1 - Duration: 3 days 1 - Last Use / Amount: this past weekend  CIWA: CIWA-Ar BP: (!) 131/91 Pulse Rate: 98 COWS:    Allergies: No Known Allergies  Home Medications: (Not in a hospital admission)   OB/GYN Status:  No LMP for male patient.  General Assessment Data Assessment unable to be  completed: Yes Reason for not completing assessment: [facility] attempted to complete assessment.  Pt said he "smoked quite a bit of crack" but appeared too lethargic to answer questions.  TTS will follow up at a later time. Location of Assessment: [facility] ED TTS Assessment: In system Is this a Tele or Face-to-Face Assessment?: Tele Assessment Is this an Initial Assessment or a Re-assessment for this encounter?: Initial Assessment Patient Accompanied by:: N/A Language Other than English: No Living Arrangements: Other (Comment) What gender do you identify as?: (public housing) Marital status: Widowed Living Arrangements: Alone Can pt return to current living arrangement?: Yes Admission Status: Voluntary Is patient capable of signing voluntary admission?: Yes Referral Source: Self/Family/Friend  Insurance type: Retail buyer)     Crisis Care Plan Living Arrangements: Alone Legal Guardian: Other:(self) Name of Psychiatrist: [name] Name of Therapist: [facility]  Education Status Is patient currently in school?: No Is the patient employed, unemployed or receiving disability?: Receiving disability income  Risk to self with the past 6 months Suicidal Ideation: Yes-Currently Present Has patient been a risk to self within the past 6 months prior to admission? : Yes Suicidal Intent: Yes-Currently Present Has patient had any suicidal intent within the past 6 months prior to admission? : Yes Is patient at risk for suicide?: Yes Suicidal Plan?: Yes-Currently Present Has patient had any suicidal plan within the past 6 months prior to admission? : Yes Specify Current Suicidal Plan: walk out into traffic Access to Means: Yes Specify Access to Suicidal Means: streets/traffic What has been your use of drugs/alcohol within the last 12 months?: crack cocaine use Previous Attempts/Gestures: Yes How many times?: 3 Other Self Harm Risks: minimal support Triggers for Past Attempts: None  known Intentional Self Injurious Behavior: None Family Suicide History: No Recent stressful life event(s): (Living in a high risk environment) Persecutory voices/beliefs?: Yes(voices telling him that he is no good) Depression: Yes Depression Symptoms: Despondent, Insomnia, Isolating, Loss of interest in usual pleasures, Feeling worthless/self pity Substance abuse history and/or treatment for substance abuse?: Yes Suicide prevention information given to non-admitted patients: Not applicable  Risk to Others within the past 6 months Homicidal Ideation: No Does patient have any lifetime risk of violence toward others beyond the six months prior to admission? : No Thoughts of Harm to Others: No Current Homicidal Intent: No Current Homicidal Plan: No Access to Homicidal Means: No Identified Victim: none History of harm to others?: No Assessment of Violence: None Noted Violent Behavior Description: none Does patient have access to weapons?: No Criminal Charges Pending?: No Does patient have a court date: No Is patient on probation?: No  Psychosis Hallucinations: Auditory(voices telling him that he is no good) Delusions: None noted  Mental Status Report Appearance/Hygiene: Unremarkable Eye Contact: Good Motor Activity: Freedom of movement Speech: Logical/coherent Level of Consciousness: Alert Mood: Depressed Affect: Appropriate to circumstance Anxiety Level: None Thought Processes: Coherent, Relevant Judgement: Impaired Orientation: Person, Place, Time, Situation Obsessive Compulsive Thoughts/Behaviors: None  Cognitive Functioning Concentration: Normal Memory: Recent Intact, Remote Intact Is patient IDD: No Insight: Poor Impulse Control: Poor Appetite: Poor Have you had any weight changes? : Loss Amount of the weight change? (lbs): 10 lbs Sleep: Decreased Total Hours of Sleep: (2) Vegetative Symptoms: None  ADLScreening [facility] Assessment Services) Patient's cognitive  ability adequate to safely complete daily activities?: Yes Patient able to express need for assistance with ADLs?: Yes Independently performs ADLs?: Yes (appropriate for developmental age)  Prior Inpatient Therapy Prior Inpatient Therapy: Yes Prior Therapy Dates: 09/2018, 10/2018, 12/2018 Prior [facility]ilty[facility]Provider([facility] Reason for Treatment: SI  Prior Outpatient Therapy Prior Outpatient Therapy: Yes Prior Therapy Dates: active Prior Therapy Facilty/Provider(s): [facility] Reason for Treatment: depression Does patient have an ACCT team?: No Does patient have Intensive In-House Services?  : No Does patient have [facility] services? : No Does patient have P4CC services?: No  ADL Screening (condition at time of admission) Patient's cognitive ability adequate to safely complete daily activities?: Yes Is the patient deaf or have difficulty hearing?: No Does the patient have difficulty concentrating, remembering, or making decisions?: No Patient able to express need for assistance with ADLs?: Yes Does the patient have difficulty dressing or bathing?:  No Independently performs ADLs?: Yes (appropriate for developmental age) Does the patient have difficulty walking or climbing stairs?: No Weakness of Legs: None Weakness of Arms/Hands: None  Home Assistive Devices/Equipment Home Assistive Devices/Equipment: None  Therapy Consults (therapy consults require a physician order) PT Evaluation Needed: No OT Evalulation Needed: No SLP Evaluation Needed: No Abuse/Neglect Assessment (Assessment to be complete while patient is alone) Abuse/Neglect Assessment Can Be Completed: Yes Physical Abuse: Denies Verbal Abuse: Denies Sexual Abuse: Denies Exploitation of patient/patient's resources: Denies Self-Neglect: Denies Values / Beliefs Cultural Requests During Hospitalization: None Spiritual Requests During Hospitalization: None Consults Spiritual Care Consult  Needed: No Social Work Consult Needed: No Regulatory affairs officer (For Healthcare) Does Patient Have a Medical Advance Directive?: No Would patient like information on creating a medical advance directive?: No - Patient declined Nutrition Screen- [facility] Adult/WL/AP Has the patient recently lost weight without trying?: Yes, 2-13 lbs. Has the patient been eating poorly because of a decreased appetite?: Yes Malnutrition Screening Tool Score: 2        Disposition: Per Ricky Ala, NP, Patient does not meet inpatient admission criteria and has been psych cleared for discharge.  Disposition Initial Assessment Completed for this Encounter: Yes  This service was provided via telemedicine using a 2-way, interactive audio and video technology.  Names of all persons participating in this telemedicine service and their role in this encounter. Name: Michael [name] Role: patient  Name: [name] [place] Gonsalves Role: TTS  Name:  Role:   Name:  Role:     Reatha Armour 01/11/2019 7:52 AM

## 2019-01-11 NOTE — ED Notes (Signed)
Pt currently resting at this time.

## 2019-01-11 NOTE — ED Notes (Signed)
Pt speaking with TTS again.

## 2019-01-11 NOTE — BHH Counselor (Signed)
Clinician called Sharrie Rothman, RN to check in with the pt. Clinician noted the pt went on a crack binge for a few days and while at [facility] he was was given Ativan. Sharrie Rothman RN reported the pt maybe able to engage in the assessment tomorrow. Clinician asked Sharrie Rothman, RN to call [phone]) if the pt wakes up if not day shift will check in.    Vertell Novak, [place], [facility], [facility] Triage Specialist [phone]

## 2019-01-12 DIAGNOSIS — R000 Tachycardia, unspecified: Secondary | ICD-10-CM | POA: Diagnosis not present

## 2019-01-12 DIAGNOSIS — I491 Atrial premature depolarization: Secondary | ICD-10-CM | POA: Diagnosis not present

## 2019-01-21 DIAGNOSIS — R079 Chest pain, unspecified: Secondary | ICD-10-CM | POA: Diagnosis not present

## 2019-01-21 DIAGNOSIS — I129 Hypertensive chronic kidney disease with stage 1 through stage 4 chronic kidney disease, or unspecified chronic kidney disease: Secondary | ICD-10-CM | POA: Diagnosis not present

## 2019-01-21 DIAGNOSIS — R072 Precordial pain: Secondary | ICD-10-CM | POA: Diagnosis not present

## 2019-01-21 DIAGNOSIS — Z7902 Long term (current) use of antithrombotics/antiplatelets: Secondary | ICD-10-CM | POA: Diagnosis not present

## 2019-01-21 DIAGNOSIS — R7989 Other specified abnormal findings of blood chemistry: Secondary | ICD-10-CM | POA: Diagnosis not present

## 2019-01-21 DIAGNOSIS — Z794 Long term (current) use of insulin: Secondary | ICD-10-CM | POA: Diagnosis not present

## 2019-01-21 DIAGNOSIS — J45909 Unspecified asthma, uncomplicated: Secondary | ICD-10-CM | POA: Diagnosis not present

## 2019-01-21 DIAGNOSIS — Z79899 Other long term (current) drug therapy: Secondary | ICD-10-CM | POA: Diagnosis not present

## 2019-01-21 DIAGNOSIS — N183 Chronic kidney disease, stage 3 (moderate): Secondary | ICD-10-CM | POA: Diagnosis not present

## 2019-01-21 DIAGNOSIS — E1122 Type 2 diabetes mellitus with diabetic chronic kidney disease: Secondary | ICD-10-CM | POA: Diagnosis not present

## 2019-01-21 DIAGNOSIS — E119 Type 2 diabetes mellitus without complications: Secondary | ICD-10-CM | POA: Diagnosis not present

## 2019-01-21 DIAGNOSIS — E1169 Type 2 diabetes mellitus with other specified complication: Secondary | ICD-10-CM | POA: Diagnosis not present

## 2019-01-21 DIAGNOSIS — I2510 Atherosclerotic heart disease of native coronary artery without angina pectoris: Secondary | ICD-10-CM | POA: Diagnosis not present

## 2019-01-21 DIAGNOSIS — Z7982 Long term (current) use of aspirin: Secondary | ICD-10-CM | POA: Diagnosis not present

## 2019-01-21 DIAGNOSIS — E785 Hyperlipidemia, unspecified: Secondary | ICD-10-CM | POA: Diagnosis not present

## 2019-01-21 DIAGNOSIS — R0789 Other chest pain: Secondary | ICD-10-CM | POA: Diagnosis not present

## 2019-01-21 DIAGNOSIS — R0602 Shortness of breath: Secondary | ICD-10-CM | POA: Diagnosis not present

## 2019-01-22 DIAGNOSIS — N183 Chronic kidney disease, stage 3 (moderate): Secondary | ICD-10-CM | POA: Diagnosis not present

## 2019-01-22 DIAGNOSIS — E1169 Type 2 diabetes mellitus with other specified complication: Secondary | ICD-10-CM | POA: Diagnosis not present

## 2019-01-22 DIAGNOSIS — R0789 Other chest pain: Secondary | ICD-10-CM | POA: Diagnosis not present

## 2019-01-23 DIAGNOSIS — N183 Chronic kidney disease, stage 3 (moderate): Secondary | ICD-10-CM | POA: Diagnosis not present

## 2019-01-23 DIAGNOSIS — E1169 Type 2 diabetes mellitus with other specified complication: Secondary | ICD-10-CM | POA: Diagnosis not present

## 2019-01-23 DIAGNOSIS — R0789 Other chest pain: Secondary | ICD-10-CM | POA: Diagnosis not present

## 2019-01-27 ENCOUNTER — Telehealth: Payer: Self-pay

## 2019-01-27 ENCOUNTER — Other Ambulatory Visit: Payer: Self-pay | Admitting: Infectious Diseases

## 2019-01-27 DIAGNOSIS — B20 Human immunodeficiency virus [HIV] disease: Secondary | ICD-10-CM

## 2019-01-27 NOTE — Telephone Encounter (Signed)
Attempted to contact patient to schedule appointment to return to care with our office. Unable to reach patient at this time nor leave voicemail. [place]

## 2019-03-01 DIAGNOSIS — Z79899 Other long term (current) drug therapy: Secondary | ICD-10-CM | POA: Diagnosis not present

## 2019-03-01 DIAGNOSIS — R0609 Other forms of dyspnea: Secondary | ICD-10-CM | POA: Diagnosis not present

## 2019-03-01 DIAGNOSIS — E1122 Type 2 diabetes mellitus with diabetic chronic kidney disease: Secondary | ICD-10-CM | POA: Diagnosis not present

## 2019-03-01 DIAGNOSIS — R0600 Dyspnea, unspecified: Secondary | ICD-10-CM | POA: Diagnosis not present

## 2019-03-01 DIAGNOSIS — N189 Chronic kidney disease, unspecified: Secondary | ICD-10-CM | POA: Diagnosis not present

## 2019-03-01 DIAGNOSIS — Z951 Presence of aortocoronary bypass graft: Secondary | ICD-10-CM | POA: Diagnosis not present

## 2019-03-01 DIAGNOSIS — E1165 Type 2 diabetes mellitus with hyperglycemia: Secondary | ICD-10-CM | POA: Diagnosis not present

## 2019-03-01 DIAGNOSIS — E785 Hyperlipidemia, unspecified: Secondary | ICD-10-CM | POA: Diagnosis not present

## 2019-03-01 DIAGNOSIS — J90 Pleural effusion, not elsewhere classified: Secondary | ICD-10-CM | POA: Diagnosis not present

## 2019-03-01 DIAGNOSIS — R739 Hyperglycemia, unspecified: Secondary | ICD-10-CM | POA: Diagnosis not present

## 2019-03-01 DIAGNOSIS — R079 Chest pain, unspecified: Secondary | ICD-10-CM | POA: Diagnosis not present

## 2019-03-01 DIAGNOSIS — R0602 Shortness of breath: Secondary | ICD-10-CM | POA: Diagnosis not present

## 2019-03-01 DIAGNOSIS — Z794 Long term (current) use of insulin: Secondary | ICD-10-CM | POA: Diagnosis not present

## 2019-03-01 DIAGNOSIS — R0789 Other chest pain: Secondary | ICD-10-CM | POA: Diagnosis not present

## 2019-03-01 DIAGNOSIS — I2510 Atherosclerotic heart disease of native coronary artery without angina pectoris: Secondary | ICD-10-CM | POA: Diagnosis not present

## 2019-03-01 DIAGNOSIS — Z7902 Long term (current) use of antithrombotics/antiplatelets: Secondary | ICD-10-CM | POA: Diagnosis not present

## 2019-03-01 DIAGNOSIS — N183 Chronic kidney disease, stage 3 (moderate): Secondary | ICD-10-CM | POA: Diagnosis not present

## 2019-03-01 DIAGNOSIS — I129 Hypertensive chronic kidney disease with stage 1 through stage 4 chronic kidney disease, or unspecified chronic kidney disease: Secondary | ICD-10-CM | POA: Diagnosis not present

## 2019-03-01 DIAGNOSIS — J45909 Unspecified asthma, uncomplicated: Secondary | ICD-10-CM | POA: Diagnosis not present

## 2019-03-04 ENCOUNTER — Telehealth: Payer: Self-pay

## 2019-03-04 ENCOUNTER — Telehealth: Payer: Self-pay | Admitting: Pharmacy Technician

## 2019-03-04 DIAGNOSIS — K644 Residual hemorrhoidal skin tags: Secondary | ICD-10-CM | POA: Diagnosis not present

## 2019-03-04 DIAGNOSIS — E1165 Type 2 diabetes mellitus with hyperglycemia: Secondary | ICD-10-CM | POA: Diagnosis not present

## 2019-03-04 DIAGNOSIS — Z794 Long term (current) use of insulin: Secondary | ICD-10-CM | POA: Diagnosis not present

## 2019-03-04 DIAGNOSIS — B20 Human immunodeficiency virus [HIV] disease: Secondary | ICD-10-CM

## 2019-03-04 MED ORDER — EMTRICITAB-RILPIVIR-TENOFOV AF 200-25-25 MG PO TABS: 1.0000 | ORAL_TABLET | Freq: Every day | ORAL | 6 refills | Status: DC

## 2019-03-04 NOTE — Telephone Encounter (Signed)
Received a call today from Michael [name] at [facility] in [place]. Manuela Schwartz states that patient is enrolled in a treatment facility with them for six months for alcohol abuse. Manuela Schwartz states that patient is off [facility] and would like to restart them. Patient has not been seen at [facility] since 10/2017, was detectable 09/16/2018 (no-showed follow up).   Will relay information to Cassie, Pharmacist.  Please advise and send prescription per [facility] if ok to restart.  [facility]: [place], [place]

## 2019-03-04 NOTE — Telephone Encounter (Signed)
[facility] Patient Advocate Encounter  Ardine Eng [phone]) called this afternoon trying to get the patient setup to receive his specialty medication from our clinic while the patient is in the [facility] treatment facility for the next 6 months. Refills have been sent over to [facility] and will be mailed to the treatment facility each month while the patient is in treatment. Contact for person coordinating his medical care and picking up the medication from [facility] is Charlena Cross [phone]). The package will be mailed to the following address: [employer] [facility] [place] [address] [place], [place] [address]

## 2019-03-04 NOTE — Telephone Encounter (Signed)
[facility] Patient Advocate Encounter   I was successful in securing patient a $7500 grant from [employer] [place] (PAF) to provide copayment coverage for [facility]. This will make the out of pocket cost $0.     I have spoken with the patient representative.    The billing information is as follows and has been shared with [place].   RxBin: [id number] PCN: [id number] Member ID: [id number] Group ID: [id number] Dates of Eligibility: 03/04/2019 through 03/03/2020  Patient knows to call the office with questions or concerns.  Bartholomew Crews, CPhT Specialty Pharmacy Patient [facility] Phone: [phone] Fax: [phone] 03/04/2019 3:32 PM

## 2019-03-04 NOTE — Telephone Encounter (Signed)
Reviewed patient's chart and it has been awhile since we've seen him and his viral load was detectable at last visit, but I would hate for him to go 6 months without his ART.  Will send in Davis to [facility] and they will mail it to the facility in [place] for him.

## 2019-03-04 NOTE — Addendum Note (Signed)
Addended by: Darletta Moll on: 03/04/2019 03:55 PM   Modules accepted: Orders

## 2019-03-05 MED FILL — ODEFSEY 200-25-25 MG TABS: 200-25-25 | 30 days supply | Qty: 30 | Fill #0

## 2019-03-05 NOTE — Telephone Encounter (Signed)
Thanks everyone

## 2019-03-18 ENCOUNTER — Other Ambulatory Visit: Payer: Self-pay

## 2019-03-18 DIAGNOSIS — Z21 Asymptomatic human immunodeficiency virus [HIV] infection status: Secondary | ICD-10-CM

## 2019-03-18 DIAGNOSIS — B20 Human immunodeficiency virus [HIV] disease: Secondary | ICD-10-CM

## 2019-03-18 DIAGNOSIS — Z113 Encounter for screening for infections with a predominantly sexual mode of transmission: Secondary | ICD-10-CM

## 2019-03-18 NOTE — Patient Outreach (Signed)
Screening: [employer] screening:  Placed call to patient with no answer and no identified voicemail.   Reviewed medical record and noted that patient is in [place] at a rehab center for 6 months.   PLAN: Spoke with Bary Castilla, assistant director, reviewed case. Instructed to close case based on documentation from medical record about patient being a rehab center.  Tomasa Rand, RN, BSN, CEN [facility] [employer] [phone]

## 2019-03-19 ENCOUNTER — Observation Stay (HOSPITAL_COMMUNITY)
Admission: EM | Admit: 2019-03-19 | Discharge: 2019-03-21 | Disposition: A | Discharge status: Home / Self Care | Payer: Medicare Other | Attending: Internal Medicine | Admitting: Internal Medicine

## 2019-03-19 ENCOUNTER — Emergency Department (HOSPITAL_COMMUNITY): Payer: Medicare Other

## 2019-03-19 ENCOUNTER — Other Ambulatory Visit: Payer: Self-pay

## 2019-03-19 ENCOUNTER — Encounter (HOSPITAL_COMMUNITY): Payer: Self-pay

## 2019-03-19 DIAGNOSIS — Z79899 Other long term (current) drug therapy: Secondary | ICD-10-CM | POA: Insufficient documentation

## 2019-03-19 DIAGNOSIS — I130 Hypertensive heart and chronic kidney disease with heart failure and stage 1 through stage 4 chronic kidney disease, or unspecified chronic kidney disease: Secondary | ICD-10-CM | POA: Diagnosis not present

## 2019-03-19 DIAGNOSIS — Z951 Presence of aortocoronary bypass graft: Secondary | ICD-10-CM | POA: Diagnosis not present

## 2019-03-19 DIAGNOSIS — I480 Paroxysmal atrial fibrillation: Secondary | ICD-10-CM | POA: Diagnosis not present

## 2019-03-19 DIAGNOSIS — N183 Chronic kidney disease, stage 3 unspecified: Secondary | ICD-10-CM | POA: Diagnosis present

## 2019-03-19 DIAGNOSIS — R000 Tachycardia, unspecified: Secondary | ICD-10-CM | POA: Diagnosis not present

## 2019-03-19 DIAGNOSIS — Z8673 Personal history of transient ischemic attack (TIA), and cerebral infarction without residual deficits: Secondary | ICD-10-CM | POA: Insufficient documentation

## 2019-03-19 DIAGNOSIS — E1140 Type 2 diabetes mellitus with diabetic neuropathy, unspecified: Secondary | ICD-10-CM | POA: Diagnosis present

## 2019-03-19 DIAGNOSIS — R05 Cough: Secondary | ICD-10-CM | POA: Diagnosis not present

## 2019-03-19 DIAGNOSIS — F1420 Cocaine dependence, uncomplicated: Secondary | ICD-10-CM | POA: Insufficient documentation

## 2019-03-19 DIAGNOSIS — J45909 Unspecified asthma, uncomplicated: Secondary | ICD-10-CM | POA: Insufficient documentation

## 2019-03-19 DIAGNOSIS — Z7951 Long term (current) use of inhaled steroids: Secondary | ICD-10-CM | POA: Insufficient documentation

## 2019-03-19 DIAGNOSIS — E785 Hyperlipidemia, unspecified: Secondary | ICD-10-CM | POA: Diagnosis not present

## 2019-03-19 DIAGNOSIS — F332 Major depressive disorder, recurrent severe without psychotic features: Secondary | ICD-10-CM | POA: Diagnosis not present

## 2019-03-19 DIAGNOSIS — Z7982 Long term (current) use of aspirin: Secondary | ICD-10-CM | POA: Diagnosis not present

## 2019-03-19 DIAGNOSIS — M109 Gout, unspecified: Secondary | ICD-10-CM | POA: Insufficient documentation

## 2019-03-19 DIAGNOSIS — Z833 Family history of diabetes mellitus: Secondary | ICD-10-CM | POA: Insufficient documentation

## 2019-03-19 DIAGNOSIS — Z8249 Family history of ischemic heart disease and other diseases of the circulatory system: Secondary | ICD-10-CM | POA: Insufficient documentation

## 2019-03-19 DIAGNOSIS — J988 Other specified respiratory disorders: Secondary | ICD-10-CM | POA: Diagnosis not present

## 2019-03-19 DIAGNOSIS — R079 Chest pain, unspecified: Secondary | ICD-10-CM | POA: Diagnosis not present

## 2019-03-19 DIAGNOSIS — Z794 Long term (current) use of insulin: Secondary | ICD-10-CM | POA: Insufficient documentation

## 2019-03-19 DIAGNOSIS — U071 COVID-19: Principal | ICD-10-CM | POA: Insufficient documentation

## 2019-03-19 DIAGNOSIS — E1165 Type 2 diabetes mellitus with hyperglycemia: Secondary | ICD-10-CM | POA: Insufficient documentation

## 2019-03-19 DIAGNOSIS — Z86711 Personal history of pulmonary embolism: Secondary | ICD-10-CM | POA: Insufficient documentation

## 2019-03-19 DIAGNOSIS — E1129 Type 2 diabetes mellitus with other diabetic kidney complication: Secondary | ICD-10-CM | POA: Diagnosis present

## 2019-03-19 DIAGNOSIS — E1122 Type 2 diabetes mellitus with diabetic chronic kidney disease: Secondary | ICD-10-CM | POA: Diagnosis not present

## 2019-03-19 DIAGNOSIS — E1142 Type 2 diabetes mellitus with diabetic polyneuropathy: Secondary | ICD-10-CM | POA: Diagnosis not present

## 2019-03-19 DIAGNOSIS — I2510 Atherosclerotic heart disease of native coronary artery without angina pectoris: Secondary | ICD-10-CM | POA: Diagnosis not present

## 2019-03-19 DIAGNOSIS — B20 Human immunodeficiency virus [HIV] disease: Secondary | ICD-10-CM

## 2019-03-19 DIAGNOSIS — R7989 Other specified abnormal findings of blood chemistry: Secondary | ICD-10-CM | POA: Diagnosis not present

## 2019-03-19 DIAGNOSIS — B1920 Unspecified viral hepatitis C without hepatic coma: Secondary | ICD-10-CM | POA: Diagnosis present

## 2019-03-19 DIAGNOSIS — IMO0002 Reserved for concepts with insufficient information to code with codable children: Secondary | ICD-10-CM

## 2019-03-19 LAB — URINALYSIS, ROUTINE W REFLEX MICROSCOPIC
Bilirubin Urine: NEGATIVE
Glucose, UA: >=500 mg/dL — AB
Ketones, ur: NEGATIVE mg/dL
Leukocytes,Ua: NEGATIVE
Nitrite: NEGATIVE
Protein, ur: 100 mg/dL — AB
Specific Gravity, Urine: 1.022 (ref 1.005–1.030)
pH: 6 (ref 5.0–8.0)

## 2019-03-19 LAB — CBC WITH DIFFERENTIAL/PLATELET
Abs Immature Granulocytes: 0.04 10*3/uL (ref 0.00–0.07)
Basophils Absolute: 0 10*3/uL (ref 0.0–0.1)
Basophils Relative: 0 %
Eosinophils Absolute: 0.1 10*3/uL (ref 0.0–0.5)
Eosinophils Relative: 2 %
HCT: 34.3 % — ABNORMAL LOW (ref 39.0–52.0)
Hemoglobin: 11.2 g/dL — ABNORMAL LOW (ref 13.0–17.0)
Immature Granulocytes: 1 %
Lymphocytes Relative: 22 %
Lymphs Abs: 1.1 10*3/uL (ref 0.7–4.0)
MCH: 26.5 pg (ref 26.0–34.0)
MCHC: 32.7 g/dL (ref 30.0–36.0)
MCV: 81.1 fL (ref 80.0–100.0)
Monocytes Absolute: 0.5 10*3/uL (ref 0.1–1.0)
Monocytes Relative: 11 %
Neutro Abs: 3.2 10*3/uL (ref 1.7–7.7)
Neutrophils Relative %: 64 %
Platelets: 141 10*3/uL — ABNORMAL LOW (ref 150–400)
RBC: 4.23 MIL/uL (ref 4.22–5.81)
RDW: 13.3 % (ref 11.5–15.5)
WBC: 4.9 10*3/uL (ref 4.0–10.5)
nRBC: 0 % (ref 0.0–0.2)

## 2019-03-19 LAB — COMPREHENSIVE METABOLIC PANEL
ALT: 14 U/L (ref 0–44)
AST: 16 U/L (ref 15–41)
Albumin: 3.9 g/dL (ref 3.5–5.0)
Alkaline Phosphatase: 77 U/L (ref 38–126)
Anion gap: 7 (ref 5–15)
BUN: 24 mg/dL — ABNORMAL HIGH (ref 8–23)
CO2: 24 mmol/L (ref 22–32)
Calcium: 9 mg/dL (ref 8.9–10.3)
Chloride: 100 mmol/L (ref 98–111)
Creatinine, Ser: 1.54 mg/dL — ABNORMAL HIGH (ref 0.61–1.24)
GFR calc Af Amer: 53 mL/min — ABNORMAL LOW (ref >60)
GFR calc non Af Amer: 46 mL/min — ABNORMAL LOW (ref >60)
Glucose, Bld: 388 mg/dL — ABNORMAL HIGH (ref 70–99)
Potassium: 4.4 mmol/L (ref 3.5–5.1)
Sodium: 131 mmol/L — ABNORMAL LOW (ref 135–145)
Total Bilirubin: 0.7 mg/dL (ref 0.3–1.2)
Total Protein: 8 g/dL (ref 6.5–8.1)

## 2019-03-19 LAB — LIPASE, BLOOD: Lipase: 52 U/L — ABNORMAL HIGH (ref 11–51)

## 2019-03-19 LAB — D-DIMER, QUANTITATIVE: D-Dimer, Quant: 1.46 ug/mL-FEU — ABNORMAL HIGH (ref 0.00–0.50)

## 2019-03-19 LAB — GLUCOSE, CAPILLARY: Glucose-Capillary: 344 mg/dL — ABNORMAL HIGH (ref 70–99)

## 2019-03-19 LAB — I-STAT TROPONIN, ED: Troponin i, poc: 0.02 ng/mL (ref 0.00–0.08)

## 2019-03-19 LAB — FERRITIN: Ferritin: 33 ng/mL (ref 24–336)

## 2019-03-19 LAB — LACTIC ACID, PLASMA
Lactic Acid, Venous: 1.6 mmol/L (ref 0.5–1.9)
Lactic Acid, Venous: 1.5 mmol/L (ref 0.5–1.9)

## 2019-03-19 LAB — C-REACTIVE PROTEIN: CRP: 1 mg/dL — ABNORMAL HIGH (ref <1.0)

## 2019-03-19 LAB — SARS CORONAVIRUS 2 BY RT PCR (HOSPITAL ORDER, PERFORMED IN ~~LOC~~ HOSPITAL LAB): SARS Coronavirus 2: POSITIVE — AB

## 2019-03-19 MED ORDER — ASPIRIN 81 MG PO CHEW
81.0000 mg | CHEWABLE_TABLET | Freq: Every day | ORAL | Status: DC
  Administered 2019-03-20 – 2019-03-21 (×2): 81 mg via ORAL
  Filled 2019-03-19 (×2): qty 1

## 2019-03-19 MED ORDER — IOHEXOL 350 MG/ML SOLN
100.0000 mL | Freq: Once | INTRAVENOUS | Status: AC | PRN
  Administered 2019-03-19: 100 mL via INTRAVENOUS

## 2019-03-19 MED ORDER — SODIUM CHLORIDE 0.9 % IV BOLUS
1000.0000 mL | Freq: Once | INTRAVENOUS | Status: AC
  Administered 2019-03-19: 1000 mL via INTRAVENOUS

## 2019-03-19 MED ORDER — BENZONATATE 100 MG PO CAPS
100.0000 mg | ORAL_CAPSULE | Freq: Three times a day (TID) | ORAL | Status: DC | PRN
  Administered 2019-03-19 – 2019-03-21 (×2): 100 mg via ORAL
  Filled 2019-03-19 (×2): qty 1

## 2019-03-19 MED ORDER — FLUTICASONE PROPIONATE HFA 44 MCG/ACT IN AERO
2.0000 | INHALATION_SPRAY | Freq: Two times a day (BID) | RESPIRATORY_TRACT | Status: DC
  Administered 2019-03-19 – 2019-03-21 (×4): 2 via RESPIRATORY_TRACT
  Filled 2019-03-19: qty 10.6

## 2019-03-19 MED ORDER — EMTRICITAB-RILPIVIR-TENOFOV AF 200-25-25 MG PO TABS
1.0000 | ORAL_TABLET | Freq: Every day | ORAL | Status: DC
  Administered 2019-03-20 – 2019-03-21 (×2): 1 via ORAL
  Filled 2019-03-19 (×3): qty 1

## 2019-03-19 MED ORDER — HYDROXYZINE HCL 25 MG PO TABS
25.0000 mg | ORAL_TABLET | Freq: Three times a day (TID) | ORAL | Status: DC | PRN
  Filled 2019-03-19: qty 1

## 2019-03-19 MED ORDER — INSULIN ASPART 100 UNIT/ML ~~LOC~~ SOLN: 3.0000 [IU] | Freq: Three times a day (TID) | SUBCUTANEOUS | Status: DC | PRN

## 2019-03-19 MED ORDER — ACETAMINOPHEN 325 MG PO TABS
650.0000 mg | ORAL_TABLET | Freq: Four times a day (QID) | ORAL | Status: DC | PRN
  Administered 2019-03-21: 650 mg via ORAL
  Filled 2019-03-19 (×2): qty 2

## 2019-03-19 MED ORDER — ALBUTEROL SULFATE HFA 108 (90 BASE) MCG/ACT IN AERS
2.0000 | INHALATION_SPRAY | Freq: Four times a day (QID) | RESPIRATORY_TRACT | Status: DC | PRN
  Administered 2019-03-20 – 2019-03-21 (×2): 2 via RESPIRATORY_TRACT
  Filled 2019-03-19: qty 6.7

## 2019-03-19 MED ORDER — INSULIN ASPART 100 UNIT/ML ~~LOC~~ SOLN
0.0000 [IU] | Freq: Every day | SUBCUTANEOUS | Status: DC
  Administered 2019-03-19: 4 [IU] via SUBCUTANEOUS

## 2019-03-19 MED ORDER — ENOXAPARIN SODIUM 40 MG/0.4ML ~~LOC~~ SOLN
40.0000 mg | SUBCUTANEOUS | Status: DC
  Administered 2019-03-19 – 2019-03-20 (×2): 40 mg via SUBCUTANEOUS
  Filled 2019-03-19 (×2): qty 0.4

## 2019-03-19 MED ORDER — GUAIFENESIN ER 600 MG PO TB12
600.0000 mg | ORAL_TABLET | Freq: Two times a day (BID) | ORAL | Status: DC
  Administered 2019-03-19 – 2019-03-21 (×4): 600 mg via ORAL
  Filled 2019-03-19 (×4): qty 1

## 2019-03-19 MED ORDER — ALLOPURINOL 100 MG PO TABS
300.0000 mg | ORAL_TABLET | Freq: Every day | ORAL | Status: DC
  Administered 2019-03-20 – 2019-03-21 (×2): 300 mg via ORAL
  Filled 2019-03-19 (×2): qty 3

## 2019-03-19 MED ORDER — ONDANSETRON HCL 4 MG PO TABS: 4.0000 mg | ORAL_TABLET | Freq: Four times a day (QID) | ORAL | Status: DC | PRN

## 2019-03-19 MED ORDER — ACETAMINOPHEN 650 MG RE SUPP: 650.0000 mg | Freq: Four times a day (QID) | RECTAL | Status: DC | PRN

## 2019-03-19 MED ORDER — FLUTICASONE PROPIONATE (INHAL) 100 MCG/BLIST IN AEPB: 1.0000 | INHALATION_SPRAY | Freq: Two times a day (BID) | RESPIRATORY_TRACT | Status: DC

## 2019-03-19 MED ORDER — INSULIN GLARGINE 100 UNIT/ML ~~LOC~~ SOLN
25.0000 [IU] | Freq: Every day | SUBCUTANEOUS | Status: DC
  Administered 2019-03-19: 25 [IU] via SUBCUTANEOUS
  Filled 2019-03-19: qty 0.25

## 2019-03-19 MED ORDER — SERTRALINE HCL 50 MG PO TABS
50.0000 mg | ORAL_TABLET | Freq: Every day | ORAL | Status: DC
  Administered 2019-03-20 – 2019-03-21 (×2): 50 mg via ORAL
  Filled 2019-03-19 (×2): qty 1

## 2019-03-19 MED ORDER — INSULIN ASPART 100 UNIT/ML ~~LOC~~ SOLN
0.0000 [IU] | Freq: Three times a day (TID) | SUBCUTANEOUS | Status: DC
  Administered 2019-03-20: 5 [IU] via SUBCUTANEOUS
  Administered 2019-03-20: 2 [IU] via SUBCUTANEOUS
  Administered 2019-03-20: 3 [IU] via SUBCUTANEOUS
  Administered 2019-03-21: 1 [IU] via SUBCUTANEOUS
  Administered 2019-03-21: 5 [IU] via SUBCUTANEOUS

## 2019-03-19 MED ORDER — ONDANSETRON HCL 4 MG/2ML IJ SOLN: 4.0000 mg | Freq: Four times a day (QID) | INTRAMUSCULAR | Status: DC | PRN

## 2019-03-19 MED ORDER — GABAPENTIN 300 MG PO CAPS
400.0000 mg | ORAL_CAPSULE | Freq: Two times a day (BID) | ORAL | Status: DC
  Administered 2019-03-19 – 2019-03-21 (×4): 400 mg via ORAL
  Filled 2019-03-19 (×4): qty 1

## 2019-03-19 MED ORDER — TRAZODONE HCL 50 MG PO TABS
50.0000 mg | ORAL_TABLET | Freq: Every evening | ORAL | Status: DC | PRN
  Administered 2019-03-20: 50 mg via ORAL
  Filled 2019-03-19: qty 1

## 2019-03-19 MED ORDER — SODIUM CHLORIDE (PF) 0.9 % IJ SOLN
INTRAMUSCULAR | Status: AC
  Filled 2019-03-19: qty 50

## 2019-03-19 MED ORDER — ALBUTEROL SULFATE HFA 108 (90 BASE) MCG/ACT IN AERS
1.0000 | INHALATION_SPRAY | RESPIRATORY_TRACT | Status: DC | PRN
  Filled 2019-03-19: qty 6.7

## 2019-03-19 NOTE — ED Provider Notes (Addendum)
[facility] DEPT Provider Note   CSN: [id number] Arrival date & time: 03/19/19  8466    History   Chief Complaint Chief Complaint  Patient presents with  . Shortness of Breath  . Fever  . Cough  . Generalized Body Aches    HPI Michael [name] is a 68 y.o. male with history of HIV, substance abuse, CAD, A. fib, asthma, hypertension, hyperlipidemia, PE presents today for cough, fever, generalized body aches and shortness of breath x3 days.  Patient reports that symptoms began when he woke up on 03/16/2019 and have remained constant since onset.  Fever: he reports that he has felt warm has not measured a temperature at home, denies antipyretic use prior to arrival.  Generalized body aches: Without focal area of tenderness, no injury, no medication use prior to arrival, moderate intensity aching sensation throughout all muscles and joints.  Cough: Nonproductive moderate intermittent without pain.  Shortness of breath: Wheezing sensation similar to prior asthma however with pain with maximum inspiration x3 days.  Reports history of pulmonary embolism only on aspirin at this time, denies extremity swelling, hemoptysis or pain with regular breath. - Review of medical chart shows patient takes [facility] for HIV disease, patient states he only takes medication occasionally, most recent HIV lab work shows +10/03/2018, recent CD4 count 270 on 09/16/2018.    HPI  Past Medical History:  Diagnosis Date  . A-fib ([place])   . Asthma    No PFTs, history of childhood asthma  . CAD (coronary artery disease)    a. 06/2013 STEMI/PCI ([facility]): LAD w/ thrombus (treated with BMS), mid 75%, D2 75%; LCX OM2 75%; RCA small, PDA 95%, PLV 95%;  b. 10/2013 Cath/PCI: ISR w/in LAD (Promus DES x 2), borderline OM2 lesion;  c. 01/2014 MV: Intermediate risk, medium-sized distal ant wall infarct w/ very small amt of peri-infarct ischemia. EF 60%.  . Cellulitis 04/2014   left facial  .  Chondromalacia of medial femoral condyle    Left knee MRI 04/28/12: Chondromalacia of the medial femoral condyle with slight peripheral degeneration of the meniscocapsular junction of the medial meniscus; followed by sports medicine  . Collagen vascular disease ([place])   . Crack cocaine use    for 20+ years, has been enrolled in detox programs in the past  . Depression    with history of hospitalization for suicidal ideation  . Diabetes mellitus 2002   Diagnosed in 2002, started insulin in 2012  . Gout   . Gout 04/28/2012  . Headache(784.0)    CT head 08/2011: Periventricular and subcortical white matter hypodensities are most in keeping with chronic microangiopathic change  . HIV infection [facility]) Nov 2012   Followed by [name]  . Hyperlipidemia   . Hypertension   . Pulmonary embolism [facility])     Patient Active Problem List   Diagnosis Date Noted  . COVID-19 virus infection 03/19/2019  . Chest pain 10/03/2018  . Malnutrition of moderate degree 09/21/2018  . MDD (major depressive disorder), recurrent episode, moderate ([place])   . Type II diabetes mellitus with renal manifestations ([place]) 05/04/2018  . GERD (gastroesophageal reflux disease) 05/04/2018  . S/P CABG (coronary artery bypass graft)   . Left testicular pain   . Depression 02/20/2018  . Epididymo-orchitis, acute 02/20/2018  . Essential hypertension 02/20/2018  . Precordial chest pain 02/20/2018  . AKI (acute kidney injury) ([place]) 02/14/2018  . HCAP (healthcare-associated pneumonia) 02/14/2018  . History of pulmonary embolism 07/04/2017  . Acute  hyponatremia 05/11/2017  . Hyperglycemia due to type 2 diabetes mellitus ([place]) 05/11/2017  . CHF (congestive heart failure) ([place]) 04/01/2017  . Hepatitis C 12/30/2016  . Chronic ischemic heart disease 11/12/2016  . Paroxysmal A-fib ([place]) 11/12/2016  . Back pain 04/18/2016  . S/P carotid endarterectomy 11/15/2015  . MDD (major depressive disorder), recurrent severe, without  psychosis ([place]) 09/09/2015  . Cocaine-induced mood disorder ([place]) 08/14/2015  . Cocaine abuse with cocaine-induced mood disorder ([place]) 08/14/2015  . Gout 07/10/2015  . Acute renal failure superimposed on stage 3 chronic kidney disease ([place]) 03/06/2015  . Normocytic anemia 03/06/2015  . Chronic kidney disease, stage III (moderate) ([place]) 03/06/2015  . Hypoglycemia   . Encounter for general adult medical examination with abnormal findings 02/09/2015  . Cocaine use disorder, severe, dependence ([place]) 12/13/2014  . Substance or medication-induced depressive disorder with onset during withdrawal ([place]) 12/13/2014  . Severe recurrent major depressive disorder with psychotic features ([place]) 12/12/2014  . Cervicalgia 06/28/2014  . Lumbar radiculopathy, chronic 06/28/2014  . Asthma, chronic 02/03/2014  . 3-vessel CAD 06/24/2013  . ED (erectile dysfunction) of organic origin 07/07/2012  . Hypertension goal BP (blood pressure) < 140/80 04/29/2012  . Chondromalacia of left knee 03/19/2012  . Hyperlipidemia with target LDL less than 100 02/12/2012  . Fibromyalgia 02/12/2012  . HIV (human immunodeficiency virus infection) ([place]) 08/27/2011  . Uncontrolled type 2 diabetes with neuropathy ([place]) 10/17/2000    Past Surgical History:  Procedure Laterality Date  . BACK SURGERY     1988  . BOWEL RESECTION    . CARDIAC SURGERY    . CERVICAL SPINE SURGERY     " rods in my neck "  . CORONARY ARTERY BYPASS GRAFT    . CORONARY STENT PLACEMENT    . NM MYOCAR PERF WALL MOTION  12/27/2011   normal  . SPINE SURGERY          Home Medications    Prior to Admission medications   Medication Sig Start Date End Date Taking? Authorizing Provider  ACCU-CHEK SOFTCLIX LANCETS lancets USE TO CHECK BLOOD GLUCOSE LEVELS UP TO 4 TIMES DAILY AS DIRECTED 11/24/17  Yes Nwoko, Herbert Pun I, NP  albuterol (PROAIR HFA) 108 (90 Base) MCG/ACT inhaler INHALE TWO PUFFS INTO THE LUNGS EVERY 6 (SIX) HOURS AS NEEDED FOR WHEEZING OR  SHORTNESS OF BREATH. Patient taking differently: Inhale 2 puffs into the lungs every 6 (six) hours as needed for wheezing or shortness of breath.  11/24/17  Yes Lindell Spar I, NP  allopurinol (ZYLOPRIM) 300 MG tablet Take 1 tablet (300 mg total) by mouth daily. For gout. 11/25/17  Yes Lindell Spar I, NP  aspirin 81 MG chewable tablet Chew 81 mg by mouth daily.   Yes [provider]  benzonatate (TESSALON) 100 MG capsule Take 1 capsule (100 mg total) by mouth every 8 (eight) hours. Patient taking differently: Take 100 mg by mouth 3 (three) times daily as needed for cough.  10/07/18  Yes Lavina Hamman, MD  emtricitabine-rilpivir-tenofovir AF (ODEFSEY) 200-25-25 MG TABS tablet Take 1 tablet by mouth daily with breakfast. 03/04/19  Yes Kuppelweiser, Cassie L, RPH-CPP  Fluticasone Propionate, Inhal, (FLOVENT DISKUS) 100 MCG/BLIST AEPB Inhale 1 puff into the lungs 2 (two) times daily.   Yes [provider]  gabapentin (NEURONTIN) 400 MG capsule Take 1 capsule (400 mg total) by mouth 2 (two) times daily. For agitation/neuropathic pain 11/24/17  Yes Lindell Spar I, NP  guaiFENesin (MUCINEX) 600 MG 12 hr tablet Take 1  tablet (600 mg total) by mouth 2 (two) times daily. Patient taking differently: Take 600 mg by mouth 2 (two) times daily as needed for cough or to loosen phlegm.  10/07/18  Yes Lavina Hamman, MD  hydrOXYzine (ATARAX/VISTARIL) 25 MG tablet Take 1 tablet (25 mg total) by mouth 3 (three) times daily as needed for anxiety. 11/24/17  Yes Lindell Spar I, NP  insulin glargine (LANTUS) 100 UNIT/ML injection Inject 0.3 mLs (30 Units total) into the skin at bedtime. For diabetes management 08/04/18  Yes [name], MD  insulin lispro (HUMALOG) 100 UNIT/ML injection Inject 0.05 mLs (5 Units total) into the skin 3 (three) times daily with meals. For diabetes management Patient taking differently: Inject 4 Units into the skin See admin instructions. Inject 4 units into the skin three times  a day with meals as needed if BGL is >130-140 11/24/17  Yes Nwoko, Herbert Pun I, NP  Insulin Syringe-Needle U-100 (B-D INS SYR MICROFINE 1CC/27G) 27G X 5/8" 1 ML MISC 1 strip by Does not apply route 3 (three) times daily as needed. For blood sugar checks 11/24/17  Yes Nwoko, Herbert Pun I, NP  metFORMIN (GLUCOPHAGE) 1000 MG tablet Take 1,000 mg by mouth 2 (two) times daily. 03/04/19  Yes [provider]  sertraline (ZOLOFT) 50 MG tablet Take 1 tablet (50 mg total) by mouth daily. 09/22/18  Yes Lady Deutscher, MD  traZODone (DESYREL) 50 MG tablet Take 1 tablet (50 mg total) by mouth at bedtime. Patient taking differently: Take 50 mg by mouth at bedtime as needed for sleep.  09/21/18  Yes Lady Deutscher, MD  metFORMIN (GLUCOPHAGE) 500 MG tablet Take 1 tablet (500 mg total) by mouth 2 (two) times daily with a meal. Patient not taking: Reported on 03/19/2019 08/04/18   [name], MD    Family History Family History  Problem Relation Age of Onset  . Diabetes Mother   . Hypertension Mother   . Hyperlipidemia Mother   . Diabetes Father   . Cancer Father   . Hypertension Father   . Diabetes Brother   . Heart disease Brother   . Diabetes Sister   . Colon cancer Neg Hx     Social History Social History   Tobacco Use  . Smoking status: Never Smoker  . Smokeless tobacco: Never Used  Substance Use Topics  . Alcohol use: No    Alcohol/week: 4.0 standard drinks    Types: 2 Cans of beer, 2 Shots of liquor per week  . Drug use: Not Currently    Types: "Crack" cocaine, Cocaine    Comment: last used sometime in April     Allergies   Patient has no known allergies.   Review of Systems Review of Systems  Constitutional: Positive for chills, fatigue and fever.  Respiratory: Positive for cough and shortness of breath.   Cardiovascular: Negative for chest pain, palpitations and leg swelling.  Gastrointestinal: Negative for abdominal pain, diarrhea, nausea and vomiting.   Genitourinary: Negative.  Negative for dysuria and hematuria.  Musculoskeletal: Positive for arthralgias and myalgias.  All other systems reviewed and are negative.  Physical Exam Updated Vital Signs BP 130/72 (BP Location: Left Arm)   Pulse 89   Temp 99.4 F (37.4 C) (Oral)   Resp 18   Ht 5\' 8"  (1.727 m)   Wt 62.6 kg   SpO2 100%   BMI 20.98 kg/m   Physical Exam Constitutional:      General: He is not in  acute distress.    Appearance: Normal appearance. He is well-developed. He is not ill-appearing or diaphoretic.  HENT:     Head: Normocephalic and atraumatic.     Right Ear: External ear normal.     Left Ear: External ear normal.     Nose: Nose normal.  Eyes:     General: Vision grossly intact. Gaze aligned appropriately.     Pupils: Pupils are equal, round, and reactive to light.  Neck:     Musculoskeletal: Normal range of motion.     Trachea: Trachea and phonation normal. No tracheal deviation.  Cardiovascular:     Rate and Rhythm: Regular rhythm. Tachycardia present.     Pulses: Normal pulses.     Heart sounds: Normal heart sounds.  Pulmonary:     Effort: Pulmonary effort is normal. Tachypnea present. No accessory muscle usage or respiratory distress.     Breath sounds: Normal breath sounds and air entry.  Abdominal:     General: There is no distension.     Palpations: Abdomen is soft.     Tenderness: There is no abdominal tenderness. There is no guarding or rebound.  Musculoskeletal: Normal range of motion.  Skin:    General: Skin is warm and dry.  Neurological:     Mental Status: He is alert.     GCS: GCS eye subscore is 4. GCS verbal subscore is 5. GCS motor subscore is 6.     Comments: Speech is clear and goal oriented, follows commands Major Cranial nerves without deficit, no facial droop Moves extremities without ataxia, coordination intact  Psychiatric:        Behavior: Behavior normal.    ED Treatments / Results  Labs (all labs ordered are  listed, but only abnormal results are displayed) Labs Reviewed  SARS CORONAVIRUS 2 ([facility]) - Abnormal; Notable for the following components:      Result Value   SARS Coronavirus 2 POSITIVE (*)    All other components within normal limits  CBC WITH DIFFERENTIAL/PLATELET - Abnormal; Notable for the following components:   Hemoglobin 11.2 (*)    HCT 34.3 (*)    Platelets 141 (*)    All other components within normal limits  COMPREHENSIVE METABOLIC PANEL - Abnormal; Notable for the following components:   Sodium 131 (*)    Glucose, Bld 388 (*)    BUN 24 (*)    Creatinine, Ser 1.54 (*)    GFR calc non Af Amer 46 (*)    GFR calc Af Amer 53 (*)    All other components within normal limits  URINALYSIS, ROUTINE W REFLEX MICROSCOPIC - Abnormal; Notable for the following components:   Color, Urine STRAW (*)    Glucose, UA >=500 (*)    Hgb urine dipstick SMALL (*)    Protein, ur 100 (*)    Bacteria, UA RARE (*)    All other components within normal limits  LIPASE, BLOOD - Abnormal; Notable for the following components:   Lipase 52 (*)    All other components within normal limits  D-DIMER, QUANTITATIVE (NOT AT [facility]) - Abnormal; Notable for the following components:   D-Dimer, Quant 1.46 (*)    All other components within normal limits  CULTURE, BLOOD (ROUTINE X 2)  CULTURE, BLOOD (ROUTINE X 2)  LACTIC ACID, PLASMA  LACTIC ACID, PLASMA  C-REACTIVE PROTEIN  FERRITIN  I-STAT TROPONIN, ED    EKG EKG Interpretation  Date/Time:  Friday March 19 2019 09:59:03 EDT Ventricular Rate:  96 PR Interval:    QRS Duration: 72 QT Interval:  328 QTC Calculation: 415 R Axis:   69 Text Interpretation:  Sinus rhythm Confirmed by Gerlene Fee [phone]) on 03/19/2019 10:24:01 AM   Radiology Ct Angio Chest Pe W And/or Wo Contrast  Result Date: 03/19/2019 CLINICAL DATA:  Chest pain, fever, not productive cough and generalized body aches for 3 days.  Positive D-dimer. EXAM: CT ANGIOGRAPHY CHEST WITH CONTRAST TECHNIQUE: Multidetector CT imaging of the chest was performed using the standard protocol during bolus administration of intravenous contrast. Multiplanar CT image reconstructions and MIPs were obtained to evaluate the vascular anatomy. CONTRAST:  16mL OMNIPAQUE IOHEXOL 350 MG/ML SOLN COMPARISON:  Chest CT 10/02/2018 FINDINGS: Cardiovascular: The heart is normal in size and stable. No pericardial effusion. There is mild tortuosity and scattered calcifications involving the thoracic aorta. Stable surgical changes from coronary artery bypass surgery and stable coronary artery calcifications. Left atrial appendage closure device noted. The pulmonary arterial tree is well opacified. No filling defects to suggest pulmonary embolism. Mediastinum/Nodes: Small scattered mediastinal and hilar lymph nodes but no mass or adenopathy. Mildly thick-walled patulous esophagus is noted without evidence of a mass. Esophagitis is possible. Recommend clinical correlation with any symptoms of dysphagia. Lungs/Pleura: Stable scarring changes involving the left upper lobe medially likely related to surgery. There are also right basilar scarring changes. No infiltrates or effusions. No worrisome pulmonary lesions or pulmonary edema. No bronchiectasis or interstitial lung disease. Upper Abdomen: No significant upper abdominal findings. No worrisome hepatic or adrenal gland lesions. Musculoskeletal: No chest wall mass, supraclavicular or axillary adenopathy. The bony thorax is intact. Stable surgical changes involving the lower cervical spine and sternum. Degenerative changes involving the thoracic spine but no worrisome bone lesions. Review of the MIP images confirms the above findings. IMPRESSION: 1. No CT findings for acute pulmonary embolism. 2. No evidence of aortic aneurysm or dissection. 3. Slightly thick-walled patulous esophagus without evidence for mass. Possible  esophagitis. Recommend clinical correlation. 4. Left upper lobe and right basilar scarring changes but no worrisome pulmonary lesions or acute pulmonary findings. Aortic Atherosclerosis (ICD10-I70.0). Electronically Signed   By: Marijo Sanes M.D.   On: 03/19/2019 14:17   Dg Chest Portable 1 View  Result Date: 03/19/2019 CLINICAL DATA:  Nonproductive cough and fever for several days EXAM: PORTABLE CHEST 1 VIEW COMPARISON:  01/10/2019 FINDINGS: Postsurgical changes are noted and stable. Elevation the right hemidiaphragm is noted greater than that seen on the prior exam. No focal infiltrate or sizable effusion is seen. No bony abnormality is noted. Postsurgical changes in the cervical spine are seen. IMPRESSION: Increase in elevation of the right hemidiaphragm without definitive infiltrate. No acute abnormality noted. Electronically Signed   By: Inez Catalina M.D.   On: 03/19/2019 11:12    Procedures Procedures (including critical care time)  Medications Ordered in ED Medications  albuterol (VENTOLIN HFA) 108 (90 Base) MCG/ACT inhaler 1-2 puff (has no administration in time range)  sodium chloride (PF) 0.9 % injection (has no administration in time range)  sodium chloride 0.9 % bolus 1,000 mL (1,000 mLs Intravenous New Bag/Given 03/19/19 1056)  iohexol (OMNIPAQUE) 350 MG/ML injection 100 mL (100 mLs Intravenous Contrast Given 03/19/19 1359)     Initial Impression / Assessment and Plan / ED Course  I have reviewed the triage vital signs and the nursing notes.  Pertinent labs & imaging results that were available during my care of the patient were reviewed by me  and considered in my medical decision making (see chart for details).  Clinical Course as of Mar 18 1621  Fri Mar 19, 2019  1329 Discussed CT delay with radiology.   [BM]  6812 Discussed with hospitalist Dr. Broadus John who will see patient for evaluation in ED.   [BM]    Clinical Course User Index [BM] Gari Crown    Elderly male with 3 days of viral-like illness with cough, shortness of breath and generalized body aches.  Mildly tachycardic without murmur on initial examination, tachypneic.  Nonproductive cough.  Lungs clear to auscultation bilaterally.  SPO2 99% on room air.  Abdomen soft nontender without peritoneal signs.  Moves extremities x4 without pain or difficulty. - EKG: Sinus rhythm Confirmed by Gerlene Fee  CBC nonacute CMP with elevated glucose and creatinine appears baseline Troponin within normal limits Lactic 1.5 Urinalysis with glucose, hemoglobin and protein present, no signs of infection Lipase 52, without abdominal pain do not suspect pancreatitis D-dimer 1.46, CT PE study ordered. Blood cultures pending CXR:  IMPRESSION:  Increase in elevation of the right hemidiaphragm without definitive  infiltrate.    No acute abnormality noted.   CT PE:  IMPRESSION:  1. No CT findings for acute pulmonary embolism.  2. No evidence of aortic aneurysm or dissection.  3. Slightly thick-walled patulous esophagus without evidence for  mass. Possible esophagitis. Recommend clinical correlation.  4. Left upper lobe and right basilar scarring changes but no  worrisome pulmonary lesions or acute pulmonary findings.    Aortic Atherosclerosis (ICD10-I70.0).   COVID-19 positive - Patient initially given fluid bolus as well as albuterol inhaler for possible asthma exacerbation.  After CT PE study returned without evidence of pulmonary embolism the COVID-19 test returned positive.  He has remained somewhat tachypneic here in the emergency department with respiratory rate approximately 25.  No tachycardia or hypoxia on room air.  Patient is not HIV positive and homeless there is concern for decompensation if he is discharged as he would have poor follow-up.  Rediscussed case with Dr. Sedonia Small who agrees with admission to hospitalist service at this time. - Consult called to hospitalist Dr. Broadus John  will be seeing patient in the ER for admission. - Patient reevaluated resting comfortably in no acute distress and sitting up in bed, vital signs stable, requesting food no new complaints. - Patient has been admitted to hospitalist service for further evaluation and management.  Note: Portions of this report may have been transcribed using voice recognition software. Every effort was made to ensure accuracy; however, inadvertent computerized transcription errors may still be present. Final Clinical Impressions(s) / ED Diagnoses   Final diagnoses:  COVID-19 virus infection  HIV infection, unspecified symptom status [facility])    ED Discharge Orders    None       Gari Crown 03/19/19 1620    Deliah Boston, PA-C 03/19/19 1622    Maudie Flakes, MD 03/22/19 1057

## 2019-03-19 NOTE — H&P (Addendum)
History and Physical    Michael [name] SKA:[id number] DOB: 18-Jun-1951 DOA: 03/19/2019  Referring MD/NP/PA: EDP PCP:  Patient coming from: Drug rehab Center  Chief Complaint: cough, fever, shortness of breath  HPI: Michael [name] is a 68 y.o. male with medical history signifiCAD status post CABG, HIV on [place], type 2 diabetes mellitus history of PE completed anticoagulation, dyslipidemia, hypertension, history of CVA, polysubstance abuse/cocaine presented to the ED with shortness of breath cough and fevers for the last 3 days. -Patient is a rather poor historian, just came from [place] few days ago, is currently staying at a rehab center in [place] for the last couple of days called [facility] recovering drug addict[facility], he reports generalized malaise weakness starting 3 days ago, subsequently noted initially dry and occasionally productive sputum with whitish phlegm, low-grade fever and chills, has not checked his temperature and subjective shortness of breath. -Patient reports compliance with his HIV meds, denies any chest pain ED Course: Work-up in the ED noted normal vital signs, O2 sats of 100% on room air, labs notable for blood glucose of 388, creatinine of 1.5, normal lactate, normal troponin, normal white blood cell count, d-dimer was 1.4, SARS COVID-19 PCR was positive, chest x-ray was unremarkable, CT chest also somewhat benign. -TRH was consulted for overnight observation/admission due to homelessness, immunocompromised state with HIV, and SARS COVID-19 infection  Review of Systems: As per HPI otherwise 14 point review of systems negative.   Past Medical History:  Diagnosis Date  . A-fib ([place])   . Asthma    No PFTs, history of childhood asthma  . CAD (coronary artery disease)    a. 06/2013 STEMI/PCI ([facility]): LAD w/ thrombus (treated with BMS), mid 75%, D2 75%; LCX OM2 75%; RCA small, PDA 95%, PLV 95%;  b. 10/2013 Cath/PCI: ISR w/in LAD (Promus DES x 2),  borderline OM2 lesion;  c. 01/2014 MV: Intermediate risk, medium-sized distal ant wall infarct w/ very small amt of peri-infarct ischemia. EF 60%.  . Cellulitis 04/2014   left facial  . Chondromalacia of medial femoral condyle    Left knee MRI 04/28/12: Chondromalacia of the medial femoral condyle with slight peripheral degeneration of the meniscocapsular junction of the medial meniscus; followed by sports medicine  . Collagen vascular disease ([place])   . Crack cocaine use    for 20+ years, has been enrolled in detox programs in the past  . Depression    with history of hospitalization for suicidal ideation  . Diabetes mellitus 2002   Diagnosed in 2002, started insulin in 2012  . Gout   . Gout 04/28/2012  . Headache(784.0)    CT head 08/2011: Periventricular and subcortical white matter hypodensities are most in keeping with chronic microangiopathic change  . HIV infection [facility]) Nov 2012   Followed by [name]  . Hyperlipidemia   . Hypertension   . Pulmonary embolism [facility])     Past Surgical History:  Procedure Laterality Date  . BACK SURGERY     1988  . BOWEL RESECTION    . CARDIAC SURGERY    . CERVICAL SPINE SURGERY     " rods in my neck "  . CORONARY ARTERY BYPASS GRAFT    . CORONARY STENT PLACEMENT    . NM MYOCAR PERF WALL MOTION  12/27/2011   normal  . SPINE SURGERY       reports that he has never smoked. He has never used smokeless tobacco. He reports previous drug use. Drugs: "  Crack" cocaine and Cocaine. He reports that he does not drink alcohol.  No Known Allergies  Family History  Problem Relation Age of Onset  . Diabetes Mother   . Hypertension Mother   . Hyperlipidemia Mother   . Diabetes Father   . Cancer Father   . Hypertension Father   . Diabetes Brother   . Heart disease Brother   . Diabetes Sister   . Colon cancer Neg Hx      Prior to Admission medications   Medication Sig Start Date End Date Taking? Authorizing Provider  ACCU-CHEK SOFTCLIX  LANCETS lancets USE TO CHECK BLOOD GLUCOSE LEVELS UP TO 4 TIMES DAILY AS DIRECTED 11/24/17  Yes Nwoko, Herbert Pun I, NP  albuterol (PROAIR HFA) 108 (90 Base) MCG/ACT inhaler INHALE TWO PUFFS INTO THE LUNGS EVERY 6 (SIX) HOURS AS NEEDED FOR WHEEZING OR SHORTNESS OF BREATH. Patient taking differently: Inhale 2 puffs into the lungs every 6 (six) hours as needed for wheezing or shortness of breath.  11/24/17  Yes Lindell Spar I, NP  allopurinol (ZYLOPRIM) 300 MG tablet Take 1 tablet (300 mg total) by mouth daily. For gout. 11/25/17  Yes Lindell Spar I, NP  aspirin 81 MG chewable tablet Chew 81 mg by mouth daily.   Yes [provider]  benzonatate (TESSALON) 100 MG capsule Take 1 capsule (100 mg total) by mouth every 8 (eight) hours. Patient taking differently: Take 100 mg by mouth 3 (three) times daily as needed for cough.  10/07/18  Yes Lavina Hamman, MD  emtricitabine-rilpivir-tenofovir AF (ODEFSEY) 200-25-25 MG TABS tablet Take 1 tablet by mouth daily with breakfast. 03/04/19  Yes Kuppelweiser, Cassie L, RPH-CPP  Fluticasone Propionate, Inhal, (FLOVENT DISKUS) 100 MCG/BLIST AEPB Inhale 1 puff into the lungs 2 (two) times daily.   Yes [provider]  gabapentin (NEURONTIN) 400 MG capsule Take 1 capsule (400 mg total) by mouth 2 (two) times daily. For agitation/neuropathic pain 11/24/17  Yes Lindell Spar I, NP  guaiFENesin (MUCINEX) 600 MG 12 hr tablet Take 1 tablet (600 mg total) by mouth 2 (two) times daily. Patient taking differently: Take 600 mg by mouth 2 (two) times daily as needed for cough or to loosen phlegm.  10/07/18  Yes Lavina Hamman, MD  hydrOXYzine (ATARAX/VISTARIL) 25 MG tablet Take 1 tablet (25 mg total) by mouth 3 (three) times daily as needed for anxiety. 11/24/17  Yes Lindell Spar I, NP  insulin glargine (LANTUS) 100 UNIT/ML injection Inject 0.3 mLs (30 Units total) into the skin at bedtime. For diabetes management 08/04/18  Yes [name], MD  insulin lispro (HUMALOG)  100 UNIT/ML injection Inject 0.05 mLs (5 Units total) into the skin 3 (three) times daily with meals. For diabetes management Patient taking differently: Inject 4 Units into the skin See admin instructions. Inject 4 units into the skin three times a day with meals as needed if BGL is >130-140 11/24/17  Yes Nwoko, Herbert Pun I, NP  Insulin Syringe-Needle U-100 (B-D INS SYR MICROFINE 1CC/27G) 27G X 5/8" 1 ML MISC 1 strip by Does not apply route 3 (three) times daily as needed. For blood sugar checks 11/24/17  Yes Nwoko, Herbert Pun I, NP  metFORMIN (GLUCOPHAGE) 1000 MG tablet Take 1,000 mg by mouth 2 (two) times daily. 03/04/19  Yes [provider]  sertraline (ZOLOFT) 50 MG tablet Take 1 tablet (50 mg total) by mouth daily. 09/22/18  Yes Lady Deutscher, MD  traZODone (DESYREL) 50 MG tablet Take 1 tablet (50 mg  total) by mouth at bedtime. Patient taking differently: Take 50 mg by mouth at bedtime as needed for sleep.  09/21/18  Yes Lady Deutscher, MD  metFORMIN (GLUCOPHAGE) 500 MG tablet Take 1 tablet (500 mg total) by mouth 2 (two) times daily with a meal. Patient not taking: Reported on 03/19/2019 08/04/18   [name], MD    Physical Exam: Vitals:   03/19/19 1330 03/19/19 1338 03/19/19 1453 03/19/19 1457  BP: (!) 157/98 (!) 157/98 130/72   Pulse: 95 90 89   Resp: (!) 26 (!) 25 18   Temp:      TempSrc:      SpO2: 100% 100% 100%   Weight:      Height:    5\' 8"  (1.727 m)      Constitutional: Alert awake oriented x2, mild cognitive delay noted, appears to be in no distress Vitals:   03/19/19 1330 03/19/19 1338 03/19/19 1453 03/19/19 1457  BP: (!) 157/98 (!) 157/98 130/72   Pulse: 95 90 89   Resp: (!) 26 (!) 25 18   Temp:      TempSrc:      SpO2: 100% 100% 100%   Weight:      Height:    5\' 8"  (1.727 m)   HEENT: Pupils equal reactive, oral mucosa is moist, neck no JVD Respiratory: Clear breath sounds bilaterally, slightly decreased at both bases Cardiovascular: Regular  rate and rhythm, no murmurs / rubs / gallops Abdomen: soft, non tender, Bowel sounds positive.  Musculoskeletal: No joint deformity upper and lower extremities. Ext: No edema Skin: no rashes, lesions, ulcers.  Neurologic: Moves all extremities, no localizing signs Psychiatric: Flat affect  Labs on Admission: I have personally reviewed following labs and imaging studies  CBC: Recent Labs  Lab 03/19/19 1034  WBC 4.9  NEUTROABS 3.2  HGB 11.2*  HCT 34.3*  MCV 81.1  PLT 829*   Basic Metabolic Panel: Recent Labs  Lab 03/19/19 1034  NA 131*  K 4.4  CL 100  CO2 24  GLUCOSE 388*  BUN 24*  CREATININE 1.54*  CALCIUM 9.0   GFR: Estimated Creatinine Clearance: 40.6 mL/min (A) (by C-G formula based on SCr of 1.54 mg/dL (H)). Liver Function Tests: Recent Labs  Lab 03/19/19 1034  AST 16  ALT 14  ALKPHOS 77  BILITOT 0.7  PROT 8.0  ALBUMIN 3.9   Recent Labs  Lab 03/19/19 1034  LIPASE 52*   No results for input(s): AMMONIA in the last 168 hours. Coagulation Profile: No results for input(s): INR, PROTIME in the last 168 hours. Cardiac Enzymes: No results for input(s): CKTOTAL, CKMB, CKMBINDEX, TROPONINI in the last 168 hours. BNP (last 3 results) No results for input(s): PROBNP in the last 8760 hours. HbA1C: No results for input(s): HGBA1C in the last 72 hours. CBG: No results for input(s): GLUCAP in the last 168 hours. Lipid Profile: No results for input(s): CHOL, HDL, LDLCALC, TRIG, CHOLHDL, LDLDIRECT in the last 72 hours. Thyroid Function Tests: No results for input(s): TSH, T4TOTAL, FREET4, T3FREE, THYROIDAB in the last 72 hours. Anemia Panel: No results for input(s): VITAMINB12, FOLATE, FERRITIN, TIBC, IRON, RETICCTPCT in the last 72 hours. Urine analysis:    Component Value Date/Time   COLORURINE STRAW (A) 03/19/2019 1446   APPEARANCEUR CLEAR 03/19/2019 1446   LABSPEC 1.022 03/19/2019 1446   PHURINE 6.0 03/19/2019 1446   GLUCOSEU >=500 (A) 03/19/2019 1446    HGBUR SMALL (A) 03/19/2019 [place] 03/19/2019 1446  KETONESUR NEGATIVE 03/19/2019 1446   PROTEINUR 100 (A) 03/19/2019 1446   UROBILINOGEN 0.2 08/13/2015 1030   NITRITE NEGATIVE 03/19/2019 1446   LEUKOCYTESUR NEGATIVE 03/19/2019 1446   Sepsis Labs: @LABRCNTIP (procalcitonin:4,lacticidven:4) ) Recent Results (from the past 240 hour(s))  SARS Coronavirus 2 (CEPHEID - Performed in [place] hospital lab), Hosp Order     Status: Abnormal   Collection Time: 03/19/19 10:35 AM   Specimen: Nasopharyngeal Swab  Result Value Ref Range Status   SARS Coronavirus 2 POSITIVE (A) NEGATIVE Final    Comment: RESULT CALLED TO, READ BACK BY AND VERIFIED WITH: D.BATCHELOR RN AT 1427 ON 03/19/19 BY N.THOMPSON (NOTE) If result is NEGATIVE SARS-CoV-2 target nucleic acids are NOT DETECTED. The SARS-CoV-2 RNA is generally detectable in upper and lower  respiratory specimens during the acute phase of infection. The lowest  concentration of SARS-CoV-2 viral copies this assay can detect is 250  copies / mL. A negative result does not preclude SARS-CoV-2 infection  and should not be used as the sole basis for treatment or other  patient management decisions.  A negative result may occur with  improper specimen collection / handling, submission of specimen other  than nasopharyngeal swab, presence of viral mutation(s) within the  areas targeted by this assay, and inadequate number of viral copies  (<250 copies / mL). A negative result must be combined with clinical  observations, patient history, and epidemiological information. If result is POSITIVE SARS-CoV-2 target nucleic acids are  DETECTED. The SARS-CoV-2 RNA is generally detectable in upper and lower  respiratory specimens during the acute phase of infection.  Positive  results are indicative of active infection with SARS-CoV-2.  Clinical  correlation with patient history and other diagnostic information is  necessary to  determine patient infection status.  Positive results do  not rule out bacterial infection or co-infection with other viruses. If result is PRESUMPTIVE POSTIVE SARS-CoV-2 nucleic acids MAY BE PRESENT.   A presumptive positive result was obtained on the submitted specimen  and confirmed on repeat testing.  While 2019 novel coronavirus  (SARS-CoV-2) nucleic acids may be present in the submitted sample  additional confirmatory testing may be necessary for epidemiological  and / or clinical management purposes  to differentiate between  SARS-CoV-2 and other Sarbecovirus currently known to infect humans.  If clinically indicated additional testing with an alternate test  methodology  [phone]) is advised. The SARS-CoV-2 RNA is generally  detectable in upper and lower respiratory specimens during the acute  phase of infection. The expected result is Negative. Fact Sheet for Patients:  StrictlyIdeas.no Fact Sheet for Healthcare Providers: BankingDealers.co.za This test is not yet approved or cleared by the [place] FDA and has been authorized for detection and/or diagnosis of SARS-CoV-2 by FDA under an Emergency Use Authorization (EUA).  This EUA will remain in effect (meaning this test can be used) for the duration of the COVID-19 declaration under Section 564(b)(1) of the Act, 21 U.S.C. section 360bbb-3(b)(1), unless the authorization is terminated or revoked sooner. Performed at [facility], [place] [address], [place], [place] [address]      Radiological Exams on Admission: Ct Angio Chest Pe W And/or Wo Contrast  Result Date: 03/19/2019 CLINICAL DATA:  Chest pain, fever, not productive cough and generalized body aches for 3 days. Positive D-dimer. EXAM: CT ANGIOGRAPHY CHEST WITH CONTRAST TECHNIQUE: Multidetector CT imaging of the chest was performed using the standard protocol during bolus administration of intravenous  contrast. Multiplanar CT image reconstructions and  MIPs were obtained to evaluate the vascular anatomy. CONTRAST:  185mL OMNIPAQUE IOHEXOL 350 MG/ML SOLN COMPARISON:  Chest CT 10/02/2018 FINDINGS: Cardiovascular: The heart is normal in size and stable. No pericardial effusion. There is mild tortuosity and scattered calcifications involving the thoracic aorta. Stable surgical changes from coronary artery bypass surgery and stable coronary artery calcifications. Left atrial appendage closure device noted. The pulmonary arterial tree is well opacified. No filling defects to suggest pulmonary embolism. Mediastinum/Nodes: Small scattered mediastinal and hilar lymph nodes but no mass or adenopathy. Mildly thick-walled patulous esophagus is noted without evidence of a mass. Esophagitis is possible. Recommend clinical correlation with any symptoms of dysphagia. Lungs/Pleura: Stable scarring changes involving the left upper lobe medially likely related to surgery. There are also right basilar scarring changes. No infiltrates or effusions. No worrisome pulmonary lesions or pulmonary edema. No bronchiectasis or interstitial lung disease. Upper Abdomen: No significant upper abdominal findings. No worrisome hepatic or adrenal gland lesions. Musculoskeletal: No chest wall mass, supraclavicular or axillary adenopathy. The bony thorax is intact. Stable surgical changes involving the lower cervical spine and sternum. Degenerative changes involving the thoracic spine but no worrisome bone lesions. Review of the MIP images confirms the above findings. IMPRESSION: 1. No CT findings for acute pulmonary embolism. 2. No evidence of aortic aneurysm or dissection. 3. Slightly thick-walled patulous esophagus without evidence for mass. Possible esophagitis. Recommend clinical correlation. 4. Left upper lobe and right basilar scarring changes but no worrisome pulmonary lesions or acute pulmonary findings. Aortic Atherosclerosis (ICD10-I70.0).  Electronically Signed   By: Marijo Sanes M.D.   On: 03/19/2019 14:17   Dg Chest Portable 1 View  Result Date: 03/19/2019 CLINICAL DATA:  Nonproductive cough and fever for several days EXAM: PORTABLE CHEST 1 VIEW COMPARISON:  01/10/2019 FINDINGS: Postsurgical changes are noted and stable. Elevation the right hemidiaphragm is noted greater than that seen on the prior exam. No focal infiltrate or sizable effusion is seen. No bony abnormality is noted. Postsurgical changes in the cervical spine are seen. IMPRESSION: Increase in elevation of the right hemidiaphragm without definitive infiltrate. No acute abnormality noted. Electronically Signed   By: Inez Catalina M.D.   On: 03/19/2019 11:12    EKG: Independently reviewed.  Sinus rhythm, no acute ST-T wave changes  Assessment/Plan  SARS COVID-19 infection -Appears relatively stable at this time however given homelessness, immunocompromise state with HIV will admit for observation -At this time I do not think he needs Remdesivir, in the absence of hypoxia and chest x-ray findings, likewise will also hold off on IV steroids at this time -Check stat CRP, d-dimer, ferritin now and in a.m. -Social work consult    HIV (human immunodeficiency virus infection) ([name]) -Last CD4 count was 270 in 12/19 with viral load of 360 -Continue HAART therapy -Followed by [name] with infectious disease  Type 2 diabetes mellitus -Continue Lantus resume at 30 units and decrease lispro with meals -We will check hemoglobin A1c  CAD status post CABG -Stable, continue aspirin  History of CVA -Continue statin likely accounts for some of his cognitive delay  Chronic kidney disease stage III -Creatinine better than recent baseline, monitor  Polysubstance/cocaine abuse -Patient reports being sober for the last couple of months, currently staying at the drug rehab center [facility] [place] -We will consult social work, disposition may be a challenge with  being COVID positive now  DVT prophylaxis: Lovenox Code Status: Full code Family Communication: No family at bedside discussed with patient in detail  Disposition Plan: Admit to [employer] called: None Admission status: Observation  Domenic Polite MD [facility]  03/19/2019, 3:54 PM

## 2019-03-19 NOTE — ED Notes (Signed)
Positive Covid-19 reported to Dr. Sedonia Small.

## 2019-03-19 NOTE — ED Triage Notes (Signed)
Patient c/o non productive cough, fever, and generalized body aches x 3 days.

## 2019-03-19 NOTE — ED Notes (Signed)
Pt repositioned, provided fresh blankets, meal ordered. Pt calm and cooperative.

## 2019-03-19 NOTE — ED Notes (Signed)
Spoke with [facility]-states at least another 2 hour delay for transport to [facility]-Jon primary made aware-to update patient

## 2019-03-19 NOTE — ED Notes (Signed)
Report given to [facility] [place] called for transport.

## 2019-03-19 NOTE — ED Notes (Signed)
Pt cooperative. In bed resting.

## 2019-03-20 DIAGNOSIS — U071 COVID-19: Secondary | ICD-10-CM | POA: Diagnosis not present

## 2019-03-20 DIAGNOSIS — F1420 Cocaine dependence, uncomplicated: Secondary | ICD-10-CM

## 2019-03-20 DIAGNOSIS — B20 Human immunodeficiency virus [HIV] disease: Secondary | ICD-10-CM

## 2019-03-20 DIAGNOSIS — F332 Major depressive disorder, recurrent severe without psychotic features: Secondary | ICD-10-CM

## 2019-03-20 DIAGNOSIS — N183 Chronic kidney disease, stage 3 (moderate): Secondary | ICD-10-CM | POA: Diagnosis not present

## 2019-03-20 LAB — CBC
HCT: 32.7 % — ABNORMAL LOW (ref 39.0–52.0)
HCT: 34.6 % — ABNORMAL LOW (ref 39.0–52.0)
Hemoglobin: 10.8 g/dL — ABNORMAL LOW (ref 13.0–17.0)
Hemoglobin: 11 g/dL — ABNORMAL LOW (ref 13.0–17.0)
MCH: 25.8 pg — ABNORMAL LOW (ref 26.0–34.0)
MCH: 26.5 pg (ref 26.0–34.0)
MCHC: 31.8 g/dL (ref 30.0–36.0)
MCHC: 33 g/dL (ref 30.0–36.0)
MCV: 80.1 fL (ref 80.0–100.0)
MCV: 81.2 fL (ref 80.0–100.0)
Platelets: 139 10*3/uL — ABNORMAL LOW (ref 150–400)
Platelets: 145 10*3/uL — ABNORMAL LOW (ref 150–400)
RBC: 4.08 MIL/uL — ABNORMAL LOW (ref 4.22–5.81)
RBC: 4.26 MIL/uL (ref 4.22–5.81)
RDW: 13.4 % (ref 11.5–15.5)
RDW: 13.4 % (ref 11.5–15.5)
WBC: 3.5 10*3/uL — ABNORMAL LOW (ref 4.0–10.5)
WBC: 3.9 10*3/uL — ABNORMAL LOW (ref 4.0–10.5)
nRBC: 0 % (ref 0.0–0.2)
nRBC: 0 % (ref 0.0–0.2)

## 2019-03-20 LAB — COMPREHENSIVE METABOLIC PANEL
ALT: 13 U/L (ref 0–44)
AST: 16 U/L (ref 15–41)
Albumin: 3.5 g/dL (ref 3.5–5.0)
Alkaline Phosphatase: 68 U/L (ref 38–126)
Anion gap: 7 (ref 5–15)
BUN: 20 mg/dL (ref 8–23)
CO2: 24 mmol/L (ref 22–32)
Calcium: 9 mg/dL (ref 8.9–10.3)
Chloride: 104 mmol/L (ref 98–111)
Creatinine, Ser: 1.3 mg/dL — ABNORMAL HIGH (ref 0.61–1.24)
GFR calc Af Amer: >60 mL/min (ref >60)
GFR calc non Af Amer: 56 mL/min — ABNORMAL LOW (ref >60)
Glucose, Bld: 230 mg/dL — ABNORMAL HIGH (ref 70–99)
Potassium: 3.8 mmol/L (ref 3.5–5.1)
Sodium: 135 mmol/L (ref 135–145)
Total Bilirubin: 0.4 mg/dL (ref 0.3–1.2)
Total Protein: 7.2 g/dL (ref 6.5–8.1)

## 2019-03-20 LAB — GLUCOSE, CAPILLARY
Glucose-Capillary: 213 mg/dL — ABNORMAL HIGH (ref 70–99)
Glucose-Capillary: 294 mg/dL — ABNORMAL HIGH (ref 70–99)
Glucose-Capillary: 87 mg/dL (ref 70–99)

## 2019-03-20 LAB — CREATININE, SERUM
Creatinine, Ser: 1.42 mg/dL — ABNORMAL HIGH (ref 0.61–1.24)
GFR calc Af Amer: 58 mL/min — ABNORMAL LOW (ref >60)
GFR calc non Af Amer: 50 mL/min — ABNORMAL LOW (ref >60)

## 2019-03-20 LAB — HEMOGLOBIN A1C
Hgb A1c MFr Bld: 12.6 % — ABNORMAL HIGH (ref 4.8–5.6)
Mean Plasma Glucose: 314.92 mg/dL

## 2019-03-20 LAB — FERRITIN: Ferritin: 39 ng/mL (ref 24–336)

## 2019-03-20 LAB — C-REACTIVE PROTEIN: CRP: <0.8 mg/dL (ref <1.0)

## 2019-03-20 LAB — D-DIMER, QUANTITATIVE: D-Dimer, Quant: 0.69 ug/mL-FEU — ABNORMAL HIGH (ref 0.00–0.50)

## 2019-03-20 MED ORDER — INSULIN GLARGINE 100 UNIT/ML ~~LOC~~ SOLN
30.0000 [IU] | Freq: Every day | SUBCUTANEOUS | Status: DC
  Administered 2019-03-20: 30 [IU] via SUBCUTANEOUS
  Filled 2019-03-20 (×2): qty 0.3

## 2019-03-20 MED ORDER — INSULIN ASPART 100 UNIT/ML ~~LOC~~ SOLN
4.0000 [IU] | Freq: Three times a day (TID) | SUBCUTANEOUS | Status: DC
  Administered 2019-03-20 – 2019-03-21 (×4): 4 [IU] via SUBCUTANEOUS

## 2019-03-20 NOTE — Care Management Obs Status (Signed)
[facility] NOTIFICATION   Patient Details  Name: Michael [name] MRN: [id number] Date of Birth: 1950/12/09   Medicare Observation Status Notification Given:  Yes    Ninfa Meeker, RN 03/20/2019, 3:38 PM

## 2019-03-20 NOTE — Progress Notes (Signed)
Pt arrived to unit via stretcher/[facility], able to ambulate to bed, shortness of breath and general weakness noted, A&Ox4, vss, 100% on room air, lungs decreased but clear, dry non-productive cough, skin intact, cell phone at bedside, clothing and wallet remain in belongings bag, admission completed, MD notified of pts arrival, urinal given, call bell in reach, will monitor.

## 2019-03-20 NOTE — Progress Notes (Addendum)
PROGRESS NOTE                                                                                                                                                                                                             Patient Demographics:    Michael [name], is a 68 y.o. male, DOB - 1950-10-11, ZYS:[id number]  Outpatient Primary MD for the patient is [name], PA-C    LOS - 1  Chief Complaint  Patient presents with  . Shortness of Breath  . Fever  . Cough  . Generalized Body Aches       Brief Narrative: Patient is a 68 y.o. male with history of HIV on antiretrovirals, CAD s/p CABG, CVA without any major functional deficits, DM-2, substance abuse-was at a drug rehab center prior to this hospital stay-presented to the ED with cough, fever and shortness of breath-further evaluation revealed COVID-19 infection, patient was subsequently admitted to the hospitalist service.  See below for further details.   Subjective:   Lying comfortably in bed-no chest pain or shortness of breath.   Assessment  & Plan :   COVID-19 infection: Appears very stable-afebrile this morning-no evidence of lower respiratory tract infection on imaging studies-no hypoxia-inflammatory markers are not elevated.  No indication for any treatment including Remdesivir at this point.  Supportive care.  COVID-19 Labs:  Recent Labs    03/19/19 1034 03/19/19 2100 03/20/19 0630  DDIMER 1.46*  --  0.69*  FERRITIN  --  33 39  CRP  --  1.0* <0.8    Lab Results  Component Value Date   SARSCOV2NAA POSITIVE (A) 03/19/2019     COVID-19 Medications:None  HIV: Last CD4 270 on 12/19-continue antiretroviral therapy.  DM-2: CBGs on the higher side-increase Lantus to 30 units, add 4 units of NovoLog with meals-continue with SSI-follow and optimize.  Metformin.  CAD S/P CABG: No anginal symptoms-continue aspirin.  History of CVA: Nonfocal  exam-continue aspirin  Peripheral Neuropathy: Likely related to DM-continue Neurontin  Asthma: Stable-no wheezing-continue bronchodilators.  Gout: No evidence of flare-continue allopurinol  Depression: Appears stable-continue Zoloft  Cocaine use/polysubstance use: Claims sober for the past few months-apparently was at a drug rehab center till this hospitalization.  Social work consulted this morning.  ABG:    Component Value Date/Time   PHART 7.405 01/27/2014 1608   PCO2ART  35.0 01/27/2014 1608   PO2ART 108.0 (H) 01/27/2014 1608   HCO3 23.8 10/02/2018 2018   TCO2 25 10/02/2018 2018   ACIDBASEDEF 1.0 10/02/2018 2018   O2SAT 78.0 10/02/2018 2018    Condition - Stable  Family Communication  :  None at bedside  Code Status :  Full Code  Diet :  Diet Order            Diet heart healthy/carb modified Room service appropriate? Yes; Fluid consistency: Thin  Diet effective now               Disposition Plan  :  Remain inpatient-awaiting social work eval for appropriate disposition  Procedures  :  None  DVT Prophylaxis  :  Lovenox   Lab Results  Component Value Date   PLT 139 (L) 03/20/2019    Inpatient Medications  Scheduled Meds: . allopurinol  300 mg Oral Daily  . aspirin  81 mg Oral Daily  . emtricitabine-rilpivir-tenofovir AF  1 tablet Oral Q breakfast  . enoxaparin (LOVENOX) injection  40 mg Subcutaneous Q24H  . fluticasone  2 puff Inhalation BID  . gabapentin  400 mg Oral BID  . guaiFENesin  600 mg Oral BID  . insulin aspart  0-9 Units Subcutaneous TID WC  . insulin aspart  4 Units Subcutaneous TID WC  . insulin glargine  30 Units Subcutaneous QHS  . sertraline  50 mg Oral Daily   Continuous Infusions: PRN Meds:.acetaminophen **OR** acetaminophen, albuterol, benzonatate, hydrOXYzine, ondansetron **OR** ondansetron (ZOFRAN) IV, traZODone  Antibiotics  :    Anti-infectives (From admission, onward)   Start     Dose/Rate Route Frequency Ordered  Stop   03/20/19 0800  emtricitabine-rilpivir-tenofovir AF (ODEFSEY) 200-25-25 MG per tablet 1 tablet     1 tablet Oral Daily with breakfast 03/19/19 2245         Time Spent in minutes  25   Oren Binet M.D on 03/20/2019 at 9:48 AM  To page go to [email or web address] - use universal password  [facility] -  Office  [phone]   Admit date - 03/19/2019    1    Objective:   Vitals:   03/20/19 0300 03/20/19 0400 03/20/19 0500 03/20/19 0600  BP:      Pulse: 93 86 83 (!) 56  Resp: 20 (!) 21 16 18   Temp:  99.2 F (37.3 C)    TempSrc:  Oral    SpO2: 99% 100% 100% 100%  Weight:      Height:        Wt Readings from Last 3 Encounters:  03/19/19 62.6 kg  12/16/18 74.4 kg  10/03/18 79.1 kg     Intake/Output Summary (Last 24 hours) at 03/20/2019 0948 Last data filed at 03/20/2019 0200 Gross per 24 hour  Intake 240 ml  Output 450 ml  Net -210 ml     Physical Exam Gen Exam:Alert awake-not in any distress HEENT:atraumatic, normocephalic Chest: B/L clear to auscultation anteriorly CVS:S1S2 regular Abdomen:soft non tender, non distended Extremities:no edema Neurology: Non focal Skin: no rash   Data Review:    CBC Recent Labs  Lab 03/19/19 0015 03/19/19 1034 03/20/19 0630  WBC 3.9* 4.9 3.5*  HGB 11.0* 11.2* 10.8*  HCT 34.6* 34.3* 32.7*  PLT 145* 141* 139*  MCV 81.2 81.1 80.1  MCH 25.8* 26.5 26.5  MCHC 31.8 32.7 33.0  RDW 13.4 13.3 13.4  LYMPHSABS  --  1.1  --   MONOABS  --  0.5  --  EOSABS  --  0.1  --   BASOSABS  --  0.0  --     Chemistries  Recent Labs  Lab 03/19/19 0015 03/19/19 1034 03/20/19 0630  NA  --  131* 135  K  --  4.4 3.8  CL  --  100 104  CO2  --  24 24  GLUCOSE  --  388* 230*  BUN  --  24* 20  CREATININE 1.42* 1.54* 1.30*  CALCIUM  --  9.0 9.0  AST  --  16 16  ALT  --  14 13  ALKPHOS  --  77 68  BILITOT  --  0.7 0.4    ------------------------------------------------------------------------------------------------------------------ No results for input(s): CHOL, HDL, LDLCALC, TRIG, CHOLHDL, LDLDIRECT in the last 72 hours.  Lab Results  Component Value Date   HGBA1C 12.6 (H) 03/19/2019   ------------------------------------------------------------------------------------------------------------------ No results for input(s): TSH, T4TOTAL, T3FREE, THYROIDAB in the last 72 hours.  Invalid input(s): FREET3 ------------------------------------------------------------------------------------------------------------------ Recent Labs    03/19/19 2100 03/20/19 0630  FERRITIN 33 39    Coagulation profile No results for input(s): INR, PROTIME in the last 168 hours.  Recent Labs    03/19/19 1034 03/20/19 0630  DDIMER 1.46* 0.69*    Cardiac Enzymes No results for input(s): CKMB, TROPONINI, MYOGLOBIN in the last 168 hours.  Invalid input(s): CK ------------------------------------------------------------------------------------------------------------------    Component Value Date/Time   BNP 36.1 05/04/2018 0237    Micro Results Recent Results (from the past 240 hour(s))  Blood culture (routine x 2)     Status: None (Preliminary result)   Collection Time: 03/19/19 10:34 AM   Specimen: Left Antecubital; Blood  Result Value Ref Range Status   Specimen Description   Final    LEFT ANTECUBITAL Performed at [facility], [place] [address] [place], [place] [address]    Special Requests   Final    BOTTLES DRAWN AEROBIC AND ANAEROBIC Blood Culture adequate volume Performed at [facility] [address], [place], [place] [address]    Culture   Final    NO GROWTH < 24 HOURS Performed at [facility] [address] [place], [place] [address]    Report Status PENDING  Incomplete  SARS Coronavirus 2 (CEPHEID - Performed in [place] hospital lab),  Hosp Order     Status: Abnormal   Collection Time: 03/19/19 10:35 AM   Specimen: Nasopharyngeal Swab  Result Value Ref Range Status   SARS Coronavirus 2 POSITIVE (A) NEGATIVE Final    Comment: RESULT CALLED TO, READ BACK BY AND VERIFIED WITH: D.BATCHELOR RN AT 1427 ON 03/19/19 BY N.THOMPSON (NOTE) If result is NEGATIVE SARS-CoV-2 target nucleic acids are NOT DETECTED. The SARS-CoV-2 RNA is generally detectable in upper and lower  respiratory specimens during the acute phase of infection. The lowest  concentration of SARS-CoV-2 viral copies this assay can detect is 250  copies / mL. A negative result does not preclude SARS-CoV-2 infection  and should not be used as the sole basis for treatment or other  patient management decisions.  A negative result may occur with  improper specimen collection / handling, submission of specimen other  than nasopharyngeal swab, presence of viral mutation(s) within the  areas targeted by this assay, and inadequate number of viral copies  (<250 copies / mL). A negative result must be combined with clinical  observations, patient history, and epidemiological information. If result is POSITIVE SARS-CoV-2 target nucleic acids are  DETECTED. The SARS-CoV-2 RNA  is generally detectable in upper and lower  respiratory specimens during the acute phase of infection.  Positive  results are indicative of active infection with SARS-CoV-2.  Clinical  correlation with patient history and other diagnostic information is  necessary to determine patient infection status.  Positive results do  not rule out bacterial infection or co-infection with other viruses. If result is PRESUMPTIVE POSTIVE SARS-CoV-2 nucleic acids MAY BE PRESENT.   A presumptive positive result was obtained on the submitted specimen  and confirmed on repeat testing.  While 2019 novel coronavirus  (SARS-CoV-2) nucleic acids may be present in the submitted sample  additional confirmatory testing may  be necessary for epidemiological  and / or clinical management purposes  to differentiate between  SARS-CoV-2 and other Sarbecovirus currently known to infect humans.  If clinically indicated additional testing with an alternate test  methodology  [phone]) is advised. The SARS-CoV-2 RNA is generally  detectable in upper and lower respiratory specimens during the acute  phase of infection. The expected result is Negative. Fact Sheet for Patients:  StrictlyIdeas.no Fact Sheet for Healthcare Providers: BankingDealers.co.za This test is not yet approved or cleared by the [place] FDA and has been authorized for detection and/or diagnosis of SARS-CoV-2 by FDA under an Emergency Use Authorization (EUA).  This EUA will remain in effect (meaning this test can be used) for the duration of the COVID-19 declaration under Section 564(b)(1) of the Act, 21 U.S.C. section 360bbb-3(b)(1), unless the authorization is terminated or revoked sooner. Performed at [facility], [place] [address], [place], [place] [address]     Radiology Reports Ct Angio Chest Pe W And/or Wo Contrast  Result Date: 03/19/2019 CLINICAL DATA:  Chest pain, fever, not productive cough and generalized body aches for 3 days. Positive D-dimer. EXAM: CT ANGIOGRAPHY CHEST WITH CONTRAST TECHNIQUE: Multidetector CT imaging of the chest was performed using the standard protocol during bolus administration of intravenous contrast. Multiplanar CT image reconstructions and MIPs were obtained to evaluate the vascular anatomy. CONTRAST:  178mL OMNIPAQUE IOHEXOL 350 MG/ML SOLN COMPARISON:  Chest CT 10/02/2018 FINDINGS: Cardiovascular: The heart is normal in size and stable. No pericardial effusion. There is mild tortuosity and scattered calcifications involving the thoracic aorta. Stable surgical changes from coronary artery bypass surgery and stable coronary artery  calcifications. Left atrial appendage closure device noted. The pulmonary arterial tree is well opacified. No filling defects to suggest pulmonary embolism. Mediastinum/Nodes: Small scattered mediastinal and hilar lymph nodes but no mass or adenopathy. Mildly thick-walled patulous esophagus is noted without evidence of a mass. Esophagitis is possible. Recommend clinical correlation with any symptoms of dysphagia. Lungs/Pleura: Stable scarring changes involving the left upper lobe medially likely related to surgery. There are also right basilar scarring changes. No infiltrates or effusions. No worrisome pulmonary lesions or pulmonary edema. No bronchiectasis or interstitial lung disease. Upper Abdomen: No significant upper abdominal findings. No worrisome hepatic or adrenal gland lesions. Musculoskeletal: No chest wall mass, supraclavicular or axillary adenopathy. The bony thorax is intact. Stable surgical changes involving the lower cervical spine and sternum. Degenerative changes involving the thoracic spine but no worrisome bone lesions. Review of the MIP images confirms the above findings. IMPRESSION: 1. No CT findings for acute pulmonary embolism. 2. No evidence of aortic aneurysm or dissection. 3. Slightly thick-walled patulous esophagus without evidence for mass. Possible esophagitis. Recommend clinical correlation. 4. Left upper lobe and right basilar scarring changes but no worrisome pulmonary lesions or acute pulmonary findings. Aortic Atherosclerosis (ICD10-I70.0).  Electronically Signed   By: Marijo Sanes M.D.   On: 03/19/2019 14:17   Dg Chest Portable 1 View  Result Date: 03/19/2019 CLINICAL DATA:  Nonproductive cough and fever for several days EXAM: PORTABLE CHEST 1 VIEW COMPARISON:  01/10/2019 FINDINGS: Postsurgical changes are noted and stable. Elevation the right hemidiaphragm is noted greater than that seen on the prior exam. No focal infiltrate or sizable effusion is seen. No bony abnormality  is noted. Postsurgical changes in the cervical spine are seen. IMPRESSION: Increase in elevation of the right hemidiaphragm without definitive infiltrate. No acute abnormality noted. Electronically Signed   By: Inez Catalina M.D.   On: 03/19/2019 11:12

## 2019-03-20 NOTE — Plan of Care (Signed)

## 2019-03-20 NOTE — Progress Notes (Signed)
Slept well, sats stayed at or near 100% on room air throughout the night, voiding in the urinal at bedside, call bell in reach, will continue to monitor.

## 2019-03-20 NOTE — Care Management CC44 (Signed)
Condition Code 44 Documentation Completed  Patient Details  Name: Michael [name] MRN: [id number] Date of Birth: 04-22-1951   Condition Code 44 given:  Yes Patient signature on Condition Code 44 notice:  Yes Documentation of 2 MD's agreement:  Yes Code 44 added to claim:  Yes    Ninfa Meeker, RN 03/20/2019, 3:38 PM

## 2019-03-21 DIAGNOSIS — N183 Chronic kidney disease, stage 3 (moderate): Secondary | ICD-10-CM | POA: Diagnosis not present

## 2019-03-21 DIAGNOSIS — U071 COVID-19: Secondary | ICD-10-CM | POA: Diagnosis not present

## 2019-03-21 DIAGNOSIS — F1420 Cocaine dependence, uncomplicated: Secondary | ICD-10-CM | POA: Diagnosis not present

## 2019-03-21 DIAGNOSIS — B20 Human immunodeficiency virus [HIV] disease: Secondary | ICD-10-CM | POA: Diagnosis not present

## 2019-03-21 LAB — COMPREHENSIVE METABOLIC PANEL
ALT: 14 U/L (ref 0–44)
AST: 19 U/L (ref 15–41)
Albumin: 3.1 g/dL — ABNORMAL LOW (ref 3.5–5.0)
Alkaline Phosphatase: 60 U/L (ref 38–126)
Anion gap: 9 (ref 5–15)
BUN: 24 mg/dL — ABNORMAL HIGH (ref 8–23)
CO2: 21 mmol/L — ABNORMAL LOW (ref 22–32)
Calcium: 8.7 mg/dL — ABNORMAL LOW (ref 8.9–10.3)
Chloride: 104 mmol/L (ref 98–111)
Creatinine, Ser: 1.48 mg/dL — ABNORMAL HIGH (ref 0.61–1.24)
GFR calc Af Amer: 56 mL/min — ABNORMAL LOW (ref >60)
GFR calc non Af Amer: 48 mL/min — ABNORMAL LOW (ref >60)
Glucose, Bld: 180 mg/dL — ABNORMAL HIGH (ref 70–99)
Potassium: 3.9 mmol/L (ref 3.5–5.1)
Sodium: 134 mmol/L — ABNORMAL LOW (ref 135–145)
Total Bilirubin: 0.3 mg/dL (ref 0.3–1.2)
Total Protein: 6.6 g/dL (ref 6.5–8.1)

## 2019-03-21 LAB — GLUCOSE, CAPILLARY
Glucose-Capillary: 154 mg/dL — ABNORMAL HIGH (ref 70–99)
Glucose-Capillary: 259 mg/dL — ABNORMAL HIGH (ref 70–99)

## 2019-03-21 LAB — C-REACTIVE PROTEIN: CRP: <0.8 mg/dL (ref <1.0)

## 2019-03-21 LAB — FERRITIN: Ferritin: 37 ng/mL (ref 24–336)

## 2019-03-21 MED ORDER — BENZONATATE 100 MG PO CAPS: 200.0000 mg | ORAL_CAPSULE | Freq: Three times a day (TID) | ORAL | 0 refills | Status: DC | PRN

## 2019-03-21 NOTE — Plan of Care (Signed)

## 2019-03-21 NOTE — Discharge Instructions (Signed)
Person Under Monitoring Name: Michael [name]  Location: [address] [place] [address]   Infection Prevention Recommendations for Individuals Confirmed to have, or Being Evaluated for, 2019 Novel Coronavirus (COVID-19) Infection Who Receive Care at Home  Individuals who are confirmed to have, or are being evaluated for, COVID-19 should follow the prevention steps below until a healthcare provider or local or state health department says they can return to normal activities.  Stay home except to get medical care You should restrict activities outside your home, except for getting medical care. Do not go to work, school, or public areas, and do not use public transportation or taxis.  Call ahead before visiting your doctor Before your medical appointment, call the healthcare provider and tell them that you have, or are being evaluated for, COVID-19 infection. This will help the healthcare providers office take steps to keep other people from getting infected. Ask your healthcare provider to call the local or state health department.  Monitor your symptoms Seek prompt medical attention if your illness is worsening (e.g., difficulty breathing). Before going to your medical appointment, call the healthcare provider and tell them that you have, or are being evaluated for, COVID-19 infection. Ask your healthcare provider to call the local or state health department.  Wear a facemask You should wear a facemask that covers your nose and mouth when you are in the same room with other people and when you visit a healthcare provider. People who live with or visit you should also wear a facemask while they are in the same room with you.  Separate yourself from other people in your home As much as possible, you should stay in a different room from other people in your home. Also, you should use a separate bathroom, if available.  Avoid sharing household items You should  not share dishes, drinking glasses, cups, eating utensils, towels, bedding, or other items with other people in your home. After using these items, you should wash them thoroughly with soap and water.  Cover your coughs and sneezes Cover your mouth and nose with a tissue when you cough or sneeze, or you can cough or sneeze into your sleeve. Throw used tissues in a lined trash can, and immediately wash your hands with soap and water for at least 20 seconds or use an alcohol-based hand rub.  Wash your [facility] your hands often and thoroughly with soap and water for at least 20 seconds. You can use an alcohol-based hand sanitizer if soap and water are not available and if your hands are not visibly dirty. Avoid touching your eyes, nose, and mouth with unwashed hands.   Prevention Steps for Caregivers and Household Members of Individuals Confirmed to have, or Being Evaluated for, COVID-19 Infection Being Cared for in the Home  If you live with, or provide care at home for, a person confirmed to have, or being evaluated for, COVID-19 infection please follow these guidelines to prevent infection:  Follow healthcare providers instructions Make sure that you understand and can help the patient follow any healthcare provider instructions for all care.  Provide for the patients basic needs You should help the patient with basic needs in the home and provide support for getting groceries, prescriptions, and other personal needs.  Monitor the patients symptoms If they are getting sicker, call his or her medical provider and tell them that the patient has, or is being evaluated for, COVID-19 infection. This will help the healthcare providers  office take steps to keep other people from getting infected. Ask the healthcare provider to call the local or state health department.  Limit the number of people who have contact with the patient  If possible, have only one caregiver for the  patient.  Other household members should stay in another home or place of residence. If this is not possible, they should stay  in another room, or be separated from the patient as much as possible. Use a separate bathroom, if available.  Restrict visitors who do not have an essential need to be in the home.  Keep older adults, very young children, and other sick people away from the patient Keep older adults, very young children, and those who have compromised immune systems or chronic health conditions away from the patient. This includes people with chronic heart, lung, or kidney conditions, diabetes, and cancer.  Ensure good ventilation Make sure that shared spaces in the home have good air flow, such as from an air conditioner or an opened window, weather permitting.  Wash your hands often  Wash your hands often and thoroughly with soap and water for at least 20 seconds. You can use an alcohol based hand sanitizer if soap and water are not available and if your hands are not visibly dirty.  Avoid touching your eyes, nose, and mouth with unwashed hands.  Use disposable paper towels to dry your hands. If not available, use dedicated cloth towels and replace them when they become wet.  Wear a facemask and gloves  Wear a disposable facemask at all times in the room and gloves when you touch or have contact with the patients blood, body fluids, and/or secretions or excretions, such as sweat, saliva, sputum, nasal mucus, vomit, urine, or feces.  Ensure the mask fits over your nose and mouth tightly, and do not touch it during use.  Throw out disposable facemasks and gloves after using them. Do not reuse.  Wash your hands immediately after removing your facemask and gloves.  If your personal clothing becomes contaminated, carefully remove clothing and launder. Wash your hands after handling contaminated clothing.  Place all used disposable facemasks, gloves, and other waste in a lined  container before disposing them with other household waste.  Remove gloves and wash your hands immediately after handling these items.  Do not share dishes, glasses, or other household items with the patient  Avoid sharing household items. You should not share dishes, drinking glasses, cups, eating utensils, towels, bedding, or other items with a patient who is confirmed to have, or being evaluated for, COVID-19 infection.  After the person uses these items, you should wash them thoroughly with soap and water.  Wash laundry thoroughly  Immediately remove and wash clothes or bedding that have blood, body fluids, and/or secretions or excretions, such as sweat, saliva, sputum, nasal mucus, vomit, urine, or feces, on them.  Wear gloves when handling laundry from the patient.  Read and follow directions on labels of laundry or clothing items and detergent. In general, wash and dry with the warmest temperatures recommended on the label.  Clean all areas the individual has used often  Clean all touchable surfaces, such as counters, tabletops, doorknobs, bathroom fixtures, toilets, phones, keyboards, tablets, and bedside tables, every day. Also, clean any surfaces that may have blood, body fluids, and/or secretions or excretions on them.  Wear gloves when cleaning surfaces the patient has come in contact with.  Use a diluted bleach solution (e.g., dilute bleach with 1  part bleach and 10 parts water) or a household disinfectant with a label that says EPA-registered for coronaviruses. To make a bleach solution at home, add 1 tablespoon of bleach to 1 quart (4 cups) of water. For a larger supply, add  cup of bleach to 1 gallon (16 cups) of water.  Read labels of cleaning products and follow recommendations provided on product labels. Labels contain instructions for safe and effective use of the cleaning product including precautions you should take when applying the product, such as wearing gloves or  eye protection and making sure you have good ventilation during use of the product.  Remove gloves and wash hands immediately after cleaning.  Monitor yourself for signs and symptoms of illness Caregivers and household members are considered close contacts, should monitor their health, and will be asked to limit movement outside of the home to the extent possible. Follow the monitoring steps for close contacts listed on the symptom monitoring form.   ? If you have additional questions, contact your local health department or call the epidemiologist on call at [phone] (available 24/7). ? This guidance is subject to change. For the most up-to-date guidance from [facility], please refer to their website: [email or web address]      Person Under Monitoring Name: Michael [name]  Location: [place] [address]   CORONAVIRUS DISEASE 2019 (COVID-19) Guidance for Persons Under Investigation You are being tested for the virus that causes coronavirus disease 2019 (COVID-19). Public health actions are necessary to ensure protection of your health and the health of others, and to prevent further spread of infection. COVID-19 is caused by a virus that can cause symptoms, such as fever, cough, and shortness of breath. The primary transmission from person to person is by coughing or sneezing. On November 05, 2018, the [place] announced a [employer] Emergency of International Concern and on November 06, 2018 the [place]. Department of Health and Human Services declared a public health emergency. If the virus that causesCOVID-19 spreads in the community, it could have severe public health consequences.  As a person under investigation for COVID-19, the [place] advises you to adhere to the following guidance until your test results are reported  to you. If your test result is positive, you will receive additional information from your provider and your local health department at that time.   Remain at home until you are cleared by your health provider or public health authorities.   Keep a log of visitors to your home using the form provided. Any visitors to your home must be aware of your isolation status.  If you plan to move to a new address or leave the county, notify the local health department in your county.  Call a doctor or seek care if you have an urgent medical need. Before seeking medical care, call ahead and get instructions from the provider before arriving at the medical office, clinic or hospital. Notify them that you are being tested for the virus that causes COVID-19 so arrangements can be made, as necessary, to prevent transmission to others in the healthcare setting. Next, notify the local health department in your county.  If a medical emergency arises and you need to call 911, inform the first responders that you are being tested for the virus that causes COVID-19. Next, notify the local health department in your county.  Adhere to all guidance set forth by the [place] for [place] of patients that is based on guidance from the Center for Disease Control and Prevention with suspected or confirmed COVID-19. It is provided with this guidance for Persons Under Investigation.  Your health and the health of our community are our top priorities. Public Health officials remain available to provide assistance and counseling to you about COVID-19 and compliance with this guidance.  Provider: ____________________________________________________________ Date: ______/_____/_________  By signing below, you acknowledge that you have read and agree to comply with this Guidance for Persons Under Investigation. ______________________________________________________________ Date:  ______/_____/_________  WHO DO I CALL? You can find a list of local health departments here: [email or web address] Health Department: ____________________________________________________________________ Contact Name: ________________________________________________________________________ Telephone: ___________________________________________________________________________  Marice Potter, [place], Communicable Disease Branch COVID-19 Guidance for Persons Under Investigation December 12, 2018

## 2019-03-21 NOTE — Discharge Summary (Signed)
PATIENT DETAILS Name: Michael [name] Age: 68 y.o. Sex: male Date of Birth: October 31, 1950 MRN: [id number]. Admitting Physician: Domenic Polite, MD [id number], [name], PA-C  Admit Date: 03/19/2019 Discharge date: 03/21/2019  Recommendations for Outpatient Follow-up:  1. Follow up with PCP in 1-2 weeks 2. Please obtain BMP/CBC in one week 3. Please ensure follow-up at the ID clinic.  Admitted From:  Home (sober living [place] )  Disposition: Home (sober living [place])   Home Health:  Yes  Equipment/Devices: None  Discharge Condition: Stable  CODE STATUS: FULL CODE  Diet recommendation:  Heart Healthy / Carb Modified  Brief Summary: See H&P, Labs, Consult and Test reports for all details in brief,Patient is a68 y.o.malewith history of HIV on antiretrovirals, CAD s/p CABG, CVA without any major functional deficits, DM-2, substance abuse-was at a drug rehab center prior to this hospital stay-presented to the ED with cough, fever and shortness of breath-further evaluation revealed COVID-19 infection, patient was subsequently admitted to the hospitalist service. See below for further details.  Brief Hospital Course: COVID-19 infection: Appears very stable-afebrile this morning-no evidence of lower respiratory tract infection on imaging studies-no hypoxia-inflammatory markers are not elevated. No indication for any treatment including Remdesivir at this point. Continue supportive care and discharge with as needed antitussives and PRN albuterol inhaler.  S  HIV: Last CD4 270 on 12/19-continue antiretroviral therapy.  DM-2: CBGs stable-continue metformin and Lantus on discharge.    CAD S/P CABG: No anginal symptoms-continue aspirin.  History of WIO:XBDZHGDJ exam-continue aspirin  Peripheral Neuropathy:Likely related to DM-continue Neurontin  Asthma: Stable-no wheezing-continue bronchodilators.  Gout: No evidence of flare-continue  allopurinol  Depression:Appears stable-continue Zoloft  Cocaine use/polysubstance use: Claims sober for the past few months-apparently was at a drug rehab center till this hospitalization. Social work consulted-she has talked with patient's drug rehab facility-he can go back there today.  Procedures/Studies: None  Discharge Diagnoses:  Active Problems:   HIV (human immunodeficiency virus infection) ([place])   Uncontrolled type 2 diabetes with neuropathy ([place])   Cocaine use disorder, severe, dependence ([place])   MDD (major depressive disorder), recurrent severe, without psychosis ([name])   Hepatitis C   Type II diabetes mellitus with renal manifestations ([place])   S/P CABG (coronary artery bypass graft)   Chronic kidney disease, stage III (moderate) ([place])   COVID-19 virus infection   Discharge Instructions:  Activity:  As tolerated  Discharge Instructions    Call MD for:  difficulty breathing, headache or visual disturbances   Complete by: As directed    Call MD for:  persistant nausea and vomiting   Complete by: As directed    Call MD for:  temperature >100.4   Complete by: As directed    Diet - low sodium heart healthy   Complete by: As directed    Diet Carb Modified   Complete by: As directed    Discharge instructions   Complete by: As directed    Follow with Primary MD  [name], PA-C in 1 week  Please get a complete blood count and chemistry panel checked by your Primary MD at your next visit, and again as instructed by your Primary MD.  Get Medicines reviewed and adjusted: Please take all your medications with you for your next visit with your Primary MD  Laboratory/radiological data: Please request your Primary MD to go over all hospital tests and procedure/radiological results at the follow up, please ask your Primary MD to get all Hospital records sent to his/her office.  In some cases, they will be blood work, cultures and biopsy results pending at the  time of your discharge. Please request that your primary care M.D. follows up on these results.  Also Note the following: If you experience worsening of your admission symptoms, develop shortness of breath, life threatening emergency, suicidal or homicidal thoughts you must seek medical attention immediately by calling 911 or calling your MD immediately  if symptoms less severe.  You must read complete instructions/literature along with all the possible adverse reactions/side effects for all the Medicines you take and that have been prescribed to you. Take any new Medicines after you have completely understood and accpet all the possible adverse reactions/side effects.   Do not drive when taking Pain medications or sleeping medications (Benzodaizepines)  Do not take more than prescribed Pain, Sleep and Anxiety Medications. It is not advisable to combine anxiety,sleep and pain medications without talking with your primary care practitioner  Special Instructions: If you have smoked or chewed Tobacco  in the last 2 yrs please stop smoking, stop any regular Alcohol  and or any Recreational drug use.  Wear Seat belts while driving.  Please note: You were cared for by a hospitalist during your hospital stay. Once you are discharged, your primary care physician will handle any further medical issues. Please note that NO REFILLS for any discharge medications will be authorized once you are discharged, as it is imperative that you return to your primary care physician (or establish a relationship with a primary care physician if you do not have one) for your post hospital discharge needs so that they can reassess your need for medications and monitor your lab values.   ?   Person Under Monitoring Name: Michael [name]  Location: [address] [place] [address]   Infection Prevention Recommendations for Individuals Confirmed to have, or Being Evaluated for, 2019 Novel Coronavirus  (COVID-19) Infection Who Receive Care at Home  Individuals who are confirmed to have, or are being evaluated for, COVID-19 should follow the prevention steps below until a healthcare provider or local or state health department says they can return to normal activities.  Stay home except to get medical care You should restrict activities outside your home, except for getting medical care. Do not go to work, school, or public areas, and do not use public transportation or taxis.  Call ahead before visiting your doctor Before your medical appointment, call the healthcare provider and tell them that you have, or are being evaluated for, COVID-19 infection. This will help the healthcare provider's office take steps to keep other people from getting infected. Ask your healthcare provider to call the local or state health department.  Monitor your symptoms Seek prompt medical attention if your illness is worsening (e.g., difficulty breathing). Before going to your medical appointment, call the healthcare provider and tell them that you have, or are being evaluated for, COVID-19 infection. Ask your healthcare provider to call the local or state health department.  Wear a facemask You should wear a facemask that covers your nose and mouth when you are in the same room with other people and when you visit a healthcare provider. People who live with or visit you should also wear a facemask while they are in the same room with you.  Separate yourself from other people in your home As much as possible, you should stay in a different room from other people in your home. Also, you should use a separate bathroom,  if available.  Avoid sharing household items You should not share dishes, drinking glasses, cups, eating utensils, towels, bedding, or other items with other people in your home. After using these items, you should wash them thoroughly with soap and water.  Cover your coughs and  sneezes Cover your mouth and nose with a tissue when you cough or sneeze, or you can cough or sneeze into your sleeve. Throw used tissues in a lined trash can, and immediately wash your hands with soap and water for at least 20 seconds or use an alcohol-based hand rub.  Wash your [facility] your hands often and thoroughly with soap and water for at least 20 seconds. You can use an alcohol-based hand sanitizer if soap and water are not available and if your hands are not visibly dirty. Avoid touching your eyes, nose, and mouth with unwashed hands.   Prevention Steps for Caregivers and Household Members of Individuals Confirmed to have, or Being Evaluated for, COVID-19 Infection Being Cared for in the Home  If you live with, or provide care at home for, a person confirmed to have, or being evaluated for, COVID-19 infection please follow these guidelines to prevent infection:  Follow healthcare provider's instructions Make sure that you understand and can help the patient follow any healthcare provider instructions for all care.  Provide for the patient's basic needs You should help the patient with basic needs in the home and provide support for getting groceries, prescriptions, and other personal needs.  Monitor the patient's symptoms If they are getting sicker, call his or her medical provider and tell them that the patient has, or is being evaluated for, COVID-19 infection. This will help the healthcare provider's office take steps to keep other people from getting infected. Ask the healthcare provider to call the local or state health department.  Limit the number of people who have contact with the patient If possible, have only one caregiver for the patient. Other household members should stay in another home or place of residence. If this is not possible, they should stay in another room, or be separated from the patient as much as possible. Use a separate bathroom, if  available. Restrict visitors who do not have an essential need to be in the home.  Keep older adults, very young children, and other sick people away from the patient Keep older adults, very young children, and those who have compromised immune systems or chronic health conditions away from the patient. This includes people with chronic heart, lung, or kidney conditions, diabetes, and cancer.  Ensure good ventilation Make sure that shared spaces in the home have good air flow, such as from an air conditioner or an opened window, weather permitting.  Wash your hands often Wash your hands often and thoroughly with soap and water for at least 20 seconds. You can use an alcohol based hand sanitizer if soap and water are not available and if your hands are not visibly dirty. Avoid touching your eyes, nose, and mouth with unwashed hands. Use disposable paper towels to dry your hands. If not available, use dedicated cloth towels and replace them when they become wet.  Wear a facemask and gloves Wear a disposable facemask at all times in the room and gloves when you touch or have contact with the patient's blood, body fluids, and/or secretions or excretions, such as sweat, saliva, sputum, nasal mucus, vomit, urine, or feces.  Ensure the mask fits over your nose and mouth tightly, and do not  touch it during use. Throw out disposable facemasks and gloves after using them. Do not reuse. Wash your hands immediately after removing your facemask and gloves. If your personal clothing becomes contaminated, carefully remove clothing and launder. Wash your hands after handling contaminated clothing. Place all used disposable facemasks, gloves, and other waste in a lined container before disposing them with other household waste. Remove gloves and wash your hands immediately after handling these items.  Do not share dishes, glasses, or other household items with the patient Avoid sharing household items. You  should not share dishes, drinking glasses, cups, eating utensils, towels, bedding, or other items with a patient who is confirmed to have, or being evaluated for, COVID-19 infection. After the person uses these items, you should wash them thoroughly with soap and water.  Wash laundry thoroughly Immediately remove and wash clothes or bedding that have blood, body fluids, and/or secretions or excretions, such as sweat, saliva, sputum, nasal mucus, vomit, urine, or feces, on them. Wear gloves when handling laundry from the patient. Read and follow directions on labels of laundry or clothing items and detergent. In general, wash and dry with the warmest temperatures recommended on the label.  Clean all areas the individual has used often Clean all touchable surfaces, such as counters, tabletops, doorknobs, bathroom fixtures, toilets, phones, keyboards, tablets, and bedside tables, every day. Also, clean any surfaces that may have blood, body fluids, and/or secretions or excretions on them. Wear gloves when cleaning surfaces the patient has come in contact with. Use a diluted bleach solution (e.g., dilute bleach with 1 part bleach and 10 parts water) or a household disinfectant with a label that says EPA-registered for coronaviruses. To make a bleach solution at home, add 1 tablespoon of bleach to 1 quart (4 cups) of water. For a larger supply, add  cup of bleach to 1 gallon (16 cups) of water. Read labels of cleaning products and follow recommendations provided on product labels. Labels contain instructions for safe and effective use of the cleaning product including precautions you should take when applying the product, such as wearing gloves or eye protection and making sure you have good ventilation during use of the product. Remove gloves and wash hands immediately after cleaning.  Monitor yourself for signs and symptoms of illness Caregivers and household members are considered close contacts,  should monitor their health, and will be asked to limit movement outside of the home to the extent possible. Follow the monitoring steps for close contacts listed on the symptom monitoring form.   ? If you have additional questions, contact your local health department or call the epidemiologist on call at [phone] (available 24/7). ? This guidance is subject to change. For the most up-to-date guidance from [facility], please refer to their website: YouBlogs.pl   Increase activity slowly   Complete by: As directed    MyChart COVID-19 home monitoring program   Complete by: Mar 21, 2019    Is the patient willing to use the [place] for home monitoring?: Yes   Temperature monitoring   Complete by: Mar 21, 2019    After how many days would you like to receive a notification of this patient's flowsheet entries?: 1     Allergies as of 03/21/2019   No Known Allergies     Medication List    TAKE these medications   Accu-Chek Softclix Lancets lancets USE TO CHECK BLOOD GLUCOSE LEVELS UP TO 4 TIMES DAILY AS DIRECTED   albuterol 108 (90  Base) MCG/ACT inhaler Commonly known as: ProAir HFA INHALE TWO PUFFS INTO THE LUNGS EVERY 6 (SIX) HOURS AS NEEDED FOR WHEEZING OR SHORTNESS OF BREATH. What changed:   how much to take  how to take this  when to take this  reasons to take this  additional instructions   allopurinol 300 MG tablet Commonly known as: ZYLOPRIM Take 1 tablet (300 mg total) by mouth daily. For gout.   aspirin 81 MG chewable tablet Chew 81 mg by mouth daily.   benzonatate 100 MG capsule Commonly known as: TESSALON Take 2 capsules (200 mg total) by mouth 3 (three) times daily as needed for cough. What changed:   how much to take  when to take this  reasons to take this   emtricitabine-rilpivir-tenofovir AF 200-25-25 MG Tabs tablet Commonly known as: ODEFSEY Take 1 tablet by mouth daily  with breakfast.   Flovent Diskus 100 MCG/BLIST Aepb Generic drug: Fluticasone Propionate (Inhal) Inhale 1 puff into the lungs 2 (two) times daily.   gabapentin 400 MG capsule Commonly known as: NEURONTIN Take 1 capsule (400 mg total) by mouth 2 (two) times daily. For agitation/neuropathic pain   guaiFENesin 600 MG 12 hr tablet Commonly known as: MUCINEX Take 1 tablet (600 mg total) by mouth 2 (two) times daily. What changed:   when to take this  reasons to take this   hydrOXYzine 25 MG tablet Commonly known as: ATARAX/VISTARIL Take 1 tablet (25 mg total) by mouth 3 (three) times daily as needed for anxiety.   insulin glargine 100 UNIT/ML injection Commonly known as: LANTUS Inject 0.3 mLs (30 Units total) into the skin at bedtime. For diabetes management   insulin lispro 100 UNIT/ML injection Commonly known as: HumaLOG Inject 0.05 mLs (5 Units total) into the skin 3 (three) times daily with meals. For diabetes management What changed:   how much to take  when to take this  additional instructions   Insulin Syringe-Needle U-100 27G X 5/8" 1 ML Misc Commonly known as: B-D INS SYR MICROFINE 1CC/27G 1 strip by Does not apply route 3 (three) times daily as needed. For blood sugar checks   metFORMIN 1000 MG tablet Commonly known as: GLUCOPHAGE Take 1,000 mg by mouth 2 (two) times daily. What changed: Another medication with the same name was removed. Continue taking this medication, and follow the directions you see here.   sertraline 50 MG tablet Commonly known as: ZOLOFT Take 1 tablet (50 mg total) by mouth daily.   traZODone 50 MG tablet Commonly known as: DESYREL Take 1 tablet (50 mg total) by mouth at bedtime. What changed:   when to take this  reasons to take this      Follow-up [place], Roger David, PA-C. Schedule an appointment as soon as possible for a visit in 1 week(s).   Specialty: Physician Assistant Contact information: [place] [address] [phone]        [name], MD. Schedule an appointment as soon as possible for a visit in 2 week(s).   Specialty: Infectious Diseases Contact information: [address] [place] [address] [phone]          No Known Allergies  Consultations:   None  Other Procedures/Studies: Ct Angio Chest Pe W And/or Wo Contrast  Result Date: 03/19/2019 CLINICAL DATA:  Chest pain, fever, not productive cough and generalized body aches for 3 days. Positive D-dimer. EXAM: CT ANGIOGRAPHY CHEST WITH CONTRAST TECHNIQUE:  Multidetector CT imaging of the chest was performed using the standard protocol during bolus administration of intravenous contrast. Multiplanar CT image reconstructions and MIPs were obtained to evaluate the vascular anatomy. CONTRAST:  149mL OMNIPAQUE IOHEXOL 350 MG/ML SOLN COMPARISON:  Chest CT 10/02/2018 FINDINGS: Cardiovascular: The heart is normal in size and stable. No pericardial effusion. There is mild tortuosity and scattered calcifications involving the thoracic aorta. Stable surgical changes from coronary artery bypass surgery and stable coronary artery calcifications. Left atrial appendage closure device noted. The pulmonary arterial tree is well opacified. No filling defects to suggest pulmonary embolism. Mediastinum/Nodes: Small scattered mediastinal and hilar lymph nodes but no mass or adenopathy. Mildly thick-walled patulous esophagus is noted without evidence of a mass. Esophagitis is possible. Recommend clinical correlation with any symptoms of dysphagia. Lungs/Pleura: Stable scarring changes involving the left upper lobe medially likely related to surgery. There are also right basilar scarring changes. No infiltrates or effusions. No worrisome pulmonary lesions or pulmonary edema. No bronchiectasis or interstitial lung disease. Upper Abdomen: No significant upper abdominal findings. No worrisome hepatic or  adrenal gland lesions. Musculoskeletal: No chest wall mass, supraclavicular or axillary adenopathy. The bony thorax is intact. Stable surgical changes involving the lower cervical spine and sternum. Degenerative changes involving the thoracic spine but no worrisome bone lesions. Review of the MIP images confirms the above findings. IMPRESSION: 1. No CT findings for acute pulmonary embolism. 2. No evidence of aortic aneurysm or dissection. 3. Slightly thick-walled patulous esophagus without evidence for mass. Possible esophagitis. Recommend clinical correlation. 4. Left upper lobe and right basilar scarring changes but no worrisome pulmonary lesions or acute pulmonary findings. Aortic Atherosclerosis (ICD10-I70.0). Electronically Signed   By: Marijo Sanes M.D.   On: 03/19/2019 14:17   Dg Chest Portable 1 View  Result Date: 03/19/2019 CLINICAL DATA:  Nonproductive cough and fever for several days EXAM: PORTABLE CHEST 1 VIEW COMPARISON:  01/10/2019 FINDINGS: Postsurgical changes are noted and stable. Elevation the right hemidiaphragm is noted greater than that seen on the prior exam. No focal infiltrate or sizable effusion is seen. No bony abnormality is noted. Postsurgical changes in the cervical spine are seen. IMPRESSION: Increase in elevation of the right hemidiaphragm without definitive infiltrate. No acute abnormality noted. Electronically Signed   By: Inez Catalina M.D.   On: 03/19/2019 11:12     TODAY-DAY OF DISCHARGE:  Subjective:   Michael [name] today has no headache,no chest abdominal pain,no new weakness tingling or numbness, feels much better wants to go home today.   Objective:   Blood pressure 125/84, pulse 92, temperature 99.1 F (37.3 C), temperature source Oral, resp. rate 18, height 5\' 8"  (1.727 m), weight 62.6 kg, SpO2 98 %.  Intake/Output Summary (Last 24 hours) at 03/21/2019 1020 Last data filed at 03/21/2019 0800 Gross per 24 hour  Intake 600 ml  Output 1125 ml  Net -525 ml    Filed Weights   03/19/19 1000  Weight: 62.6 kg    Exam: Awake Alert, Oriented *3, No new F.N deficits, Normal affect [place].AT,PERRAL Supple Neck,No JVD, No cervical lymphadenopathy appriciated.  Symmetrical Chest wall movement, Good air movement bilaterally, CTAB RRR,No Gallops,Rubs or new Murmurs, No Parasternal Heave +ve B.Sounds, Abd Soft, Non tender, No organomegaly appriciated, No rebound -guarding or rigidity. No Cyanosis, Clubbing or edema, No new Rash or bruise   PERTINENT RADIOLOGIC STUDIES: Ct Angio Chest Pe W And/or Wo Contrast  Result Date: 03/19/2019 CLINICAL DATA:  Chest pain, fever, not productive cough and generalized  body aches for 3 days. Positive D-dimer. EXAM: CT ANGIOGRAPHY CHEST WITH CONTRAST TECHNIQUE: Multidetector CT imaging of the chest was performed using the standard protocol during bolus administration of intravenous contrast. Multiplanar CT image reconstructions and MIPs were obtained to evaluate the vascular anatomy. CONTRAST:  124mL OMNIPAQUE IOHEXOL 350 MG/ML SOLN COMPARISON:  Chest CT 10/02/2018 FINDINGS: Cardiovascular: The heart is normal in size and stable. No pericardial effusion. There is mild tortuosity and scattered calcifications involving the thoracic aorta. Stable surgical changes from coronary artery bypass surgery and stable coronary artery calcifications. Left atrial appendage closure device noted. The pulmonary arterial tree is well opacified. No filling defects to suggest pulmonary embolism. Mediastinum/Nodes: Small scattered mediastinal and hilar lymph nodes but no mass or adenopathy. Mildly thick-walled patulous esophagus is noted without evidence of a mass. Esophagitis is possible. Recommend clinical correlation with any symptoms of dysphagia. Lungs/Pleura: Stable scarring changes involving the left upper lobe medially likely related to surgery. There are also right basilar scarring changes. No infiltrates or effusions. No worrisome pulmonary  lesions or pulmonary edema. No bronchiectasis or interstitial lung disease. Upper Abdomen: No significant upper abdominal findings. No worrisome hepatic or adrenal gland lesions. Musculoskeletal: No chest wall mass, supraclavicular or axillary adenopathy. The bony thorax is intact. Stable surgical changes involving the lower cervical spine and sternum. Degenerative changes involving the thoracic spine but no worrisome bone lesions. Review of the MIP images confirms the above findings. IMPRESSION: 1. No CT findings for acute pulmonary embolism. 2. No evidence of aortic aneurysm or dissection. 3. Slightly thick-walled patulous esophagus without evidence for mass. Possible esophagitis. Recommend clinical correlation. 4. Left upper lobe and right basilar scarring changes but no worrisome pulmonary lesions or acute pulmonary findings. Aortic Atherosclerosis (ICD10-I70.0). Electronically Signed   By: Marijo Sanes M.D.   On: 03/19/2019 14:17   Dg Chest Portable 1 View  Result Date: 03/19/2019 CLINICAL DATA:  Nonproductive cough and fever for several days EXAM: PORTABLE CHEST 1 VIEW COMPARISON:  01/10/2019 FINDINGS: Postsurgical changes are noted and stable. Elevation the right hemidiaphragm is noted greater than that seen on the prior exam. No focal infiltrate or sizable effusion is seen. No bony abnormality is noted. Postsurgical changes in the cervical spine are seen. IMPRESSION: Increase in elevation of the right hemidiaphragm without definitive infiltrate. No acute abnormality noted. Electronically Signed   By: Inez Catalina M.D.   On: 03/19/2019 11:12     PERTINENT LAB RESULTS: CBC: Recent Labs    03/19/19 1034 03/20/19 0630  WBC 4.9 3.5*  HGB 11.2* 10.8*  HCT 34.3* 32.7*  PLT 141* 139*   CMET CMP     Component Value Date/Time   NA 134 (L) 03/21/2019 0611   NA 140 12/26/2016 1233   K 3.9 03/21/2019 0611   CL 104 03/21/2019 0611   CO2 21 (L) 03/21/2019 0611   GLUCOSE 180 (H) 03/21/2019 0611    BUN 24 (H) 03/21/2019 0611   BUN 15 12/26/2016 1233   CREATININE 1.48 (H) 03/21/2019 0611   CREATININE 1.52 (H) 10/14/2017 1100   CALCIUM 8.7 (L) 03/21/2019 0611   PROT 6.6 03/21/2019 0611   PROT 7.1 12/26/2016 1233   ALBUMIN 3.1 (L) 03/21/2019 0611   ALBUMIN 3.9 12/26/2016 1233   AST 19 03/21/2019 0611   ALT 14 03/21/2019 0611   ALKPHOS 60 03/21/2019 0611   BILITOT 0.3 03/21/2019 0611   BILITOT <0.2 12/26/2016 1233   GFRNONAA 48 (L) 03/21/2019 0611   GFRNONAA 69 11/02/2013 1019  GFRAA 56 (L) 03/21/2019 0611   GFRAA 80 11/02/2013 1019    GFR Estimated Creatinine Clearance: 42.3 mL/min (A) (by C-G formula based on SCr of 1.48 mg/dL (H)). Recent Labs    03/19/19 1034  LIPASE 52*   No results for input(s): CKTOTAL, CKMB, CKMBINDEX, TROPONINI in the last 72 hours. Invalid input(s): POCBNP Recent Labs    03/19/19 1034 03/20/19 0630  DDIMER 1.46* 0.69*   Recent Labs    03/19/19 0020  HGBA1C 12.6*   No results for input(s): CHOL, HDL, LDLCALC, TRIG, CHOLHDL, LDLDIRECT in the last 72 hours. No results for input(s): TSH, T4TOTAL, T3FREE, THYROIDAB in the last 72 hours.  Invalid input(s): FREET3 Recent Labs    03/20/19 0630 03/21/19 0611  FERRITIN 39 37   Coags: No results for input(s): INR in the last 72 hours.  Invalid input(s): PT Microbiology: Recent Results (from the past 240 hour(s))  Blood culture (routine x 2)     Status: None (Preliminary result)   Collection Time: 03/19/19 10:34 AM   Specimen: Left Antecubital; Blood  Result Value Ref Range Status   Specimen Description   Final    LEFT ANTECUBITAL Performed at [facility], [place] [address] [place], [place] [address]    Special Requests   Final    BOTTLES DRAWN AEROBIC AND ANAEROBIC Blood Culture adequate volume Performed at [facility] [address] [place], [place] [address]    Culture   Final    NO GROWTH 2 DAYS Performed at [facility] [address], [place], [place] [address]    Report Status PENDING  Incomplete  Blood culture (routine x 2)     Status: None (Preliminary result)   Collection Time: 03/19/19 10:35 AM   Specimen: BLOOD  Result Value Ref Range Status   Specimen Description   Final    BLOOD RIGHT ANTECUBITAL Performed at [facility] [address], [place], [place] [address]    Special Requests   Final    BOTTLES DRAWN AEROBIC AND ANAEROBIC Blood Culture results may not be optimal due to an excessive volume of blood received in culture bottles Performed at [facility] [address], [place], [place] [address]    Culture   Final    NO GROWTH 2 DAYS Performed at [facility] [address] [place], [place] [address]    Report Status PENDING  Incomplete  SARS Coronavirus 2 (CEPHEID - Performed in [facility] hospital lab), Hosp Order     Status: Abnormal   Collection Time: 03/19/19 10:35 AM   Specimen: Nasopharyngeal Swab  Result Value Ref Range Status   SARS Coronavirus 2 POSITIVE (A) NEGATIVE Final    Comment: RESULT CALLED TO, READ BACK BY AND VERIFIED WITH: D.BATCHELOR RN AT 1427 ON 03/19/19 BY N.THOMPSON (NOTE) If result is NEGATIVE SARS-CoV-2 target nucleic acids are NOT DETECTED. The SARS-CoV-2 RNA is generally detectable in upper and lower  respiratory specimens during the acute phase of infection. The lowest  concentration of SARS-CoV-2 viral copies this assay can detect is 250  copies / mL. A negative result does not preclude SARS-CoV-2 infection  and should not be used as the sole basis for treatment or other  patient management decisions.  A negative result may occur with  improper specimen collection / handling, submission of specimen other  than nasopharyngeal swab, presence of viral mutation(s) within the  areas targeted by this assay, and inadequate  number of viral copies  (<250 copies / mL). A negative result must be combined with  clinical  observations, patient history, and epidemiological information. If result is POSITIVE SARS-CoV-2 target nucleic acids are  DETECTED. The SARS-CoV-2 RNA is generally detectable in upper and lower  respiratory specimens during the acute phase of infection.  Positive  results are indicative of active infection with SARS-CoV-2.  Clinical  correlation with patient history and other diagnostic information is  necessary to determine patient infection status.  Positive results do  not rule out bacterial infection or co-infection with other viruses. If result is PRESUMPTIVE POSTIVE SARS-CoV-2 nucleic acids MAY BE PRESENT.   A presumptive positive result was obtained on the submitted specimen  and confirmed on repeat testing.  While 2019 novel coronavirus  (SARS-CoV-2) nucleic acids may be present in the submitted sample  additional confirmatory testing may be necessary for epidemiological  and / or clinical management purposes  to differentiate between  SARS-CoV-2 and other Sarbecovirus currently known to infect humans.  If clinically indicated additional testing with an alternate test  methodology  [phone]) is advised. The SARS-CoV-2 RNA is generally  detectable in upper and lower respiratory specimens during the acute  phase of infection. The expected result is Negative. Fact Sheet for Patients:  StrictlyIdeas.no Fact Sheet for Healthcare Providers: BankingDealers.co.za This test is not yet approved or cleared by the [place] FDA and has been authorized for detection and/or diagnosis of SARS-CoV-2 by FDA under an Emergency Use Authorization (EUA).  This EUA will remain in effect (meaning this test can be used) for the duration of the COVID-19 declaration under Section 564(b)(1) of the Act, 21 U.S.C. section 360bbb-3(b)(1), unless the authorization is terminated or revoked sooner. Performed at [facility],  [place] [address], [place], [place] [address]     FURTHER DISCHARGE INSTRUCTIONS:  Get Medicines reviewed and adjusted: Please take all your medications with you for your next visit with your Primary MD  Laboratory/radiological data: Please request your Primary MD to go over all hospital tests and procedure/radiological results at the follow up, please ask your Primary MD to get all Hospital records sent to his/her office.  In some cases, they will be blood work, cultures and biopsy results pending at the time of your discharge. Please request that your primary care M.D. goes through all the records of your hospital data and follows up on these results.  Also Note the following: If you experience worsening of your admission symptoms, develop shortness of breath, life threatening emergency, suicidal or homicidal thoughts you must seek medical attention immediately by calling 911 or calling your MD immediately  if symptoms less severe.  You must read complete instructions/literature along with all the possible adverse reactions/side effects for all the Medicines you take and that have been prescribed to you. Take any new Medicines after you have completely understood and accpet all the possible adverse reactions/side effects.   Do not drive when taking Pain medications or sleeping medications (Benzodaizepines)  Do not take more than prescribed Pain, Sleep and Anxiety Medications. It is not advisable to combine anxiety,sleep and pain medications without talking with your primary care practitioner  Special Instructions: If you have smoked or chewed Tobacco  in the last 2 yrs please stop smoking, stop any regular Alcohol  and or any Recreational drug use.  Wear Seat belts while driving.  Please note: You were cared for by a hospitalist during your hospital stay. Once you are discharged, your  primary care physician will handle any further medical issues. Please note that NO REFILLS for any  discharge medications will be authorized once you are discharged, as it is imperative that you return to your primary care physician (or establish a relationship with a primary care physician if you do not have one) for your post hospital discharge needs so that they can reassess your need for medications and monitor your lab values.  Total Time spent coordinating discharge including counseling, education and face to face time equals 25  minutes.  Signed: Shanker Ghimire 03/21/2019 10:20 AM

## 2019-03-21 NOTE — TOC Initial Note (Signed)
Transition of Care [facility]) - Initial/Assessment Note    Patient Details  Name: Michael [name] MRN: [id number] Date of Birth: 08-06-1951  [facility]) CM/SW Contact:    Geralynn Ochs, LCSW Phone Number: 03/21/2019, 10:09 AM  Clinical Narrative:  Patient admitted from [facility]. CSW discussed placement with patient over the phone with RN assistance. Patient says he has been in the program for 3 days after they transferred him upon completion of the program in [place]. Patient found a phone number for his case worker with the program, Jeral Fruit [phone]), and gave permission for CSW to reach out to him. CSW contacted Jody to discuss patient's return to the program. CSW discussed how patient will need quarantine due to positive COVID-19 diagnosis, and Jody said they are already aware and have prepared an isolation room for him. Jody said they have also already rapid tested his roommates and anyone he has come into contact with to ensure that they do not have the disease, as well. Jeral Fruit will come and pick up the patient to return him to their program when he is discharged. CSW alerted Jody that the patient would be discharged today, and he will be contacted when the patient's ready for pickup.               Expected Discharge Plan: OP Rehab(outpatient drug rehab) Barriers to Discharge: Barriers Resolved   Patient Goals and CMS Choice Patient states their goals for this hospitalization and ongoing recovery are:: to get back to [facility]      Expected Discharge Plan and Services Expected Discharge Plan: OP Rehab(outpatient drug rehab)     Post Acute Care Choice: NA Living arrangements for the past 2 months: (alcohol rehab)                                      Prior Living Arrangements/Services Living arrangements for the past 2 months: (alcohol rehab) Lives with:: Self Patient language and need for interpreter reviewed:: No        Need for  Family Participation in Patient Care: No (Comment) Care giver support system in place?: Yes (comment)   Criminal Activity/Legal Involvement Pertinent to Current Situation/Hospitalization: No - Comment as needed  Activities of Daily Living Home Assistive Devices/Equipment: None ADL Screening (condition at time of admission) Patient's cognitive ability adequate to safely complete daily activities?: Yes Is the patient deaf or have difficulty hearing?: No Does the patient have difficulty seeing, even when wearing glasses/contacts?: No Does the patient have difficulty concentrating, remembering, or making decisions?: Yes(says he has some memory problems after previous CVA) Patient able to express need for assistance with ADLs?: Yes Does the patient have difficulty dressing or bathing?: No Independently performs ADLs?: Yes (appropriate for developmental age) Does the patient have difficulty walking or climbing stairs?: No Weakness of Legs: None Weakness of Arms/Hands: None  Permission Sought/Granted Permission sought to share information with : Chartered certified accountant granted to share information with : Yes, Verbal Permission Granted     Permission granted to share info w AGENCY: [place] granted to share info w Contact Information: Jody: [phone]  Emotional Assessment   Attitude/Demeanor/Rapport: Engaged Affect (typically observed): Pleasant, Other (comment) Orientation: : Oriented to Self, Oriented to Place, Oriented to  Time, Oriented to Situation Alcohol / Substance Use: Alcohol Use Psych Involvement: No (comment)  Admission  diagnosis:  HIV infection, unspecified symptom status ([place]) [B20] COVID-19 virus infection [U07.1] Patient Active Problem List   Diagnosis Date Noted  . COVID-19 virus infection 03/19/2019  . Chest pain 10/03/2018  . Malnutrition of moderate degree 09/21/2018  . MDD (major depressive disorder), recurrent  episode, moderate ([place])   . Type II diabetes mellitus with renal manifestations ([place]) 05/04/2018  . GERD (gastroesophageal reflux disease) 05/04/2018  . S/P CABG (coronary artery bypass graft)   . Left testicular pain   . Depression 02/20/2018  . Epididymo-orchitis, acute 02/20/2018  . Essential hypertension 02/20/2018  . Precordial chest pain 02/20/2018  . AKI (acute kidney injury) ([place]) 02/14/2018  . HCAP (healthcare-associated pneumonia) 02/14/2018  . History of pulmonary embolism 07/04/2017  . Acute hyponatremia 05/11/2017  . Hyperglycemia due to type 2 diabetes mellitus ([place]) 05/11/2017  . CHF (congestive heart failure) ([place]) 04/01/2017  . Hepatitis C 12/30/2016  . Chronic ischemic heart disease 11/12/2016  . Paroxysmal A-fib ([place]) 11/12/2016  . Back pain 04/18/2016  . S/P carotid endarterectomy 11/15/2015  . MDD (major depressive disorder), recurrent severe, without psychosis ([place]) 09/09/2015  . Cocaine-induced mood disorder ([place]) 08/14/2015  . Cocaine abuse with cocaine-induced mood disorder ([place]) 08/14/2015  . Gout 07/10/2015  . Acute renal failure superimposed on stage 3 chronic kidney disease ([place]) 03/06/2015  . Normocytic anemia 03/06/2015  . Chronic kidney disease, stage III (moderate) ([place]) 03/06/2015  . Hypoglycemia   . Encounter for general adult medical examination with abnormal findings 02/09/2015  . Cocaine use disorder, severe, dependence ([place]) 12/13/2014  . Substance or medication-induced depressive disorder with onset during withdrawal ([name]) 12/13/2014  . Severe recurrent major depressive disorder with psychotic features ([place]) 12/12/2014  . Cervicalgia 06/28/2014  . Lumbar radiculopathy, chronic 06/28/2014  . Asthma, chronic 02/03/2014  . 3-vessel CAD 06/24/2013  . ED (erectile dysfunction) of organic origin 07/07/2012  . Hypertension goal BP (blood pressure) < 140/80 04/29/2012  . Chondromalacia of left knee 03/19/2012  . Hyperlipidemia with target LDL  less than 100 02/12/2012  . Fibromyalgia 02/12/2012  . HIV (human immunodeficiency virus infection) ([place]) 08/27/2011  . Uncontrolled type 2 diabetes with neuropathy ([place]) 10/17/2000   PCP:  [name], PA-C Pharmacy:   [facility], [place] [address] [place] [address] Phone: [phone] Fax: [place], [place] [address] [place] [address] Phone: [phone] Fax: [phone]  [place], [place] [address] Phone: [phone] Fax: [phone]     Social Determinants of Health (SDOH) Interventions    Readmission Risk Interventions No flowsheet data found.

## 2019-03-21 NOTE — Progress Notes (Signed)
Discharge instructions (including medications) discussed with and copy provided to patient/caregiver 

## 2019-03-22 ENCOUNTER — Other Ambulatory Visit: Payer: Medicare Other

## 2019-03-22 LAB — GLUCOSE, CAPILLARY: Glucose-Capillary: 183 mg/dL — ABNORMAL HIGH (ref 70–99)

## 2019-03-24 LAB — CULTURE, BLOOD (ROUTINE X 2)
Culture: NO GROWTH
Culture: NO GROWTH
Special Requests: ADEQUATE

## 2019-03-25 IMAGING — CT CT ANGIO CHEST
2 of 7 series · 17 of 36 positions shown · IV contrast (APPLIED)
Comparison: Chest radiograph dated 04/01/2017 and CT dated
04/02/2017

CLINICAL DATA: 66-year-old male with shortness of breath.

EXAM:
CT ANGIOGRAPHY CHEST WITH CONTRAST
TECHNIQUE: Multidetector CT imaging of the chest was performed using the
standard protocol during bolus administration of intravenous
contrast. Multiplanar CT image reconstructions and MIPs were
obtained to evaluate the vascular anatomy.
CONTRAST:  100mL 4K29BP-CKA IOPAMIDOL (4K29BP-CKA) INJECTION 76%

[Series 6: thins · axial · 0.68mm/px · z∈[-414,-146]mm · 16 of 434 slices shown]
[im 26/434  lung]
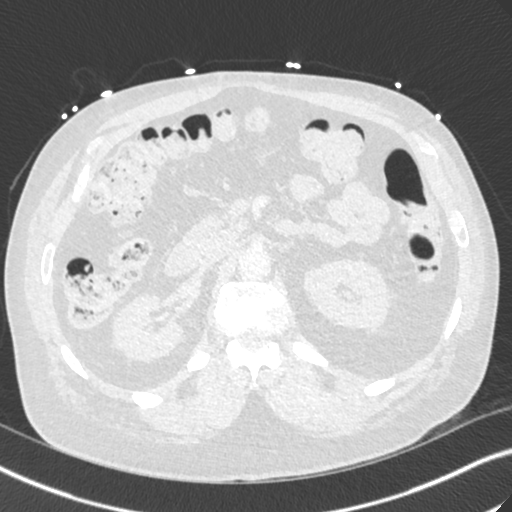
[im 51/434  mediastinal]
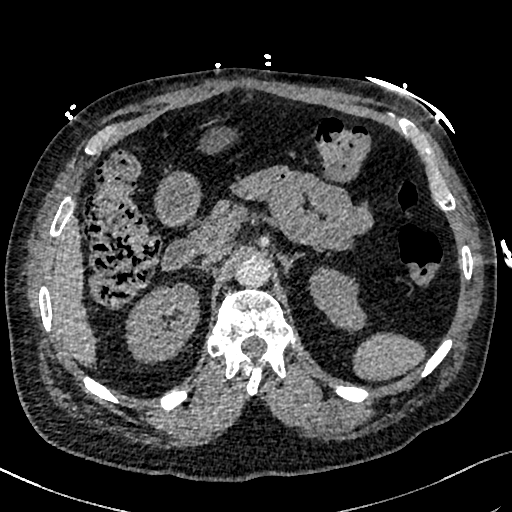
[im 77/434  lung]
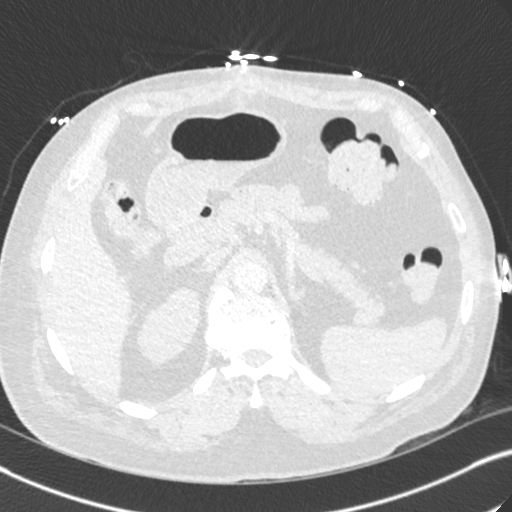
[im 102/434  mediastinal]
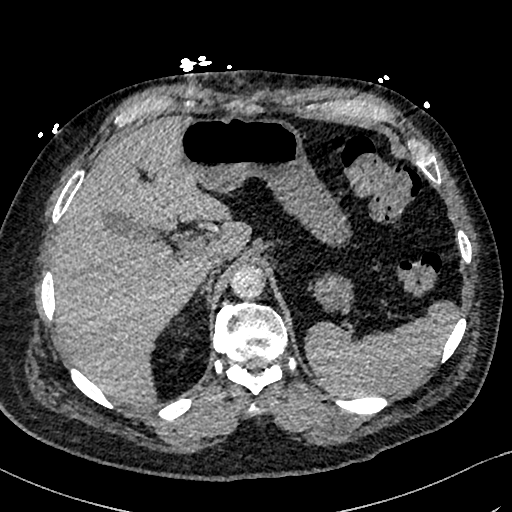
[im 128/434  lung]
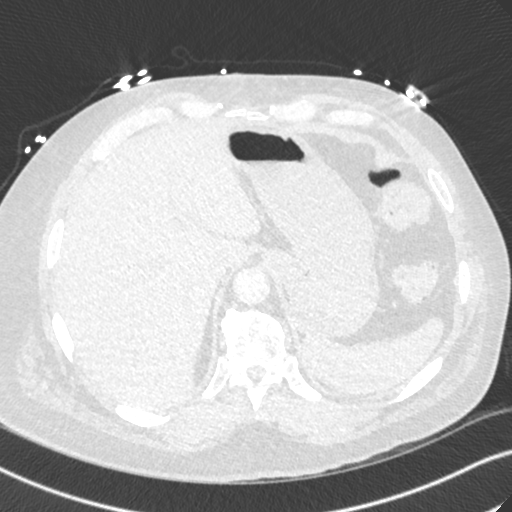
[im 153/434  mediastinal]
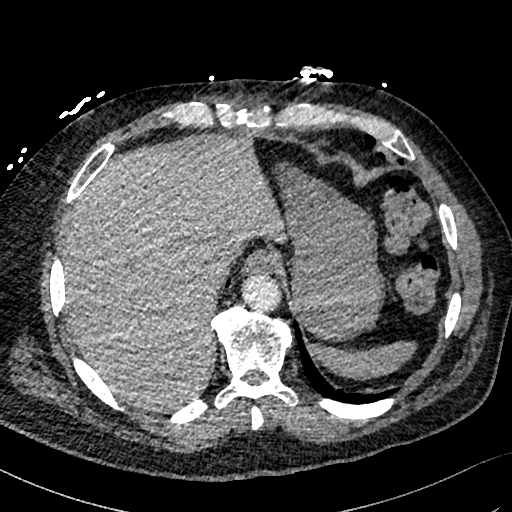
[im 179/434  lung]
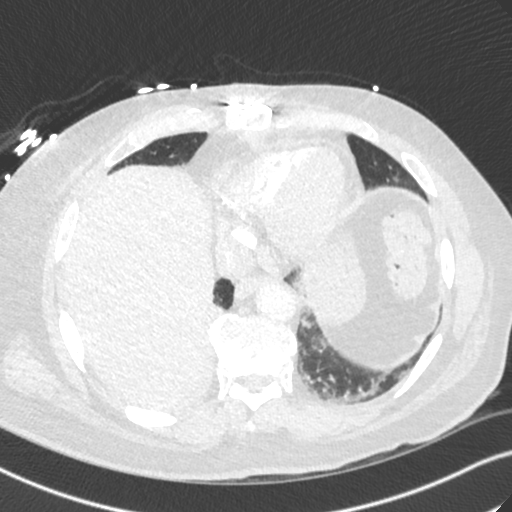
[im 204/434  mediastinal]
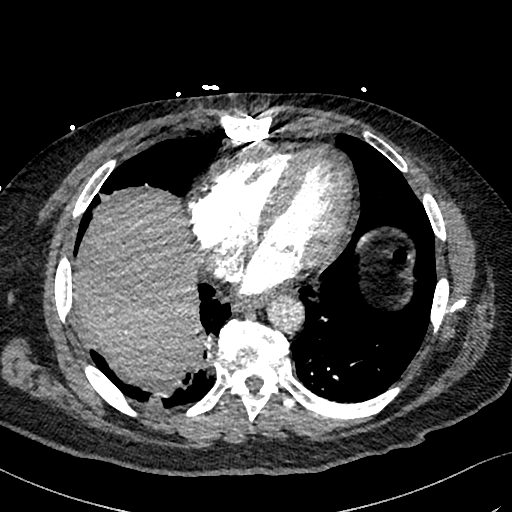
[im 230/434  lung]
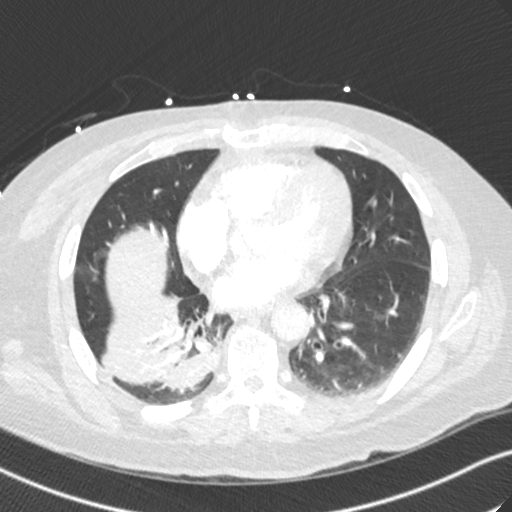
[im 255/434  mediastinal]
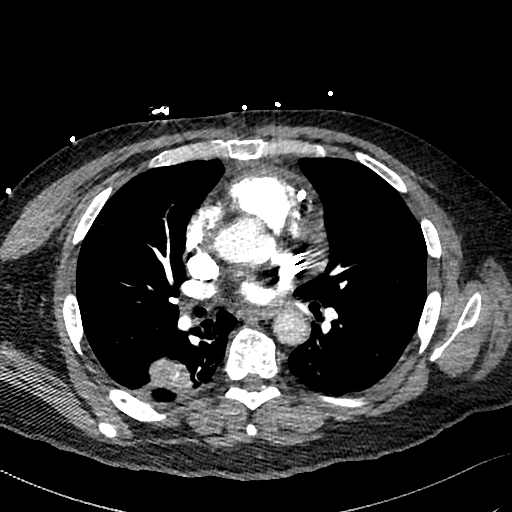
[im 281/434  lung]
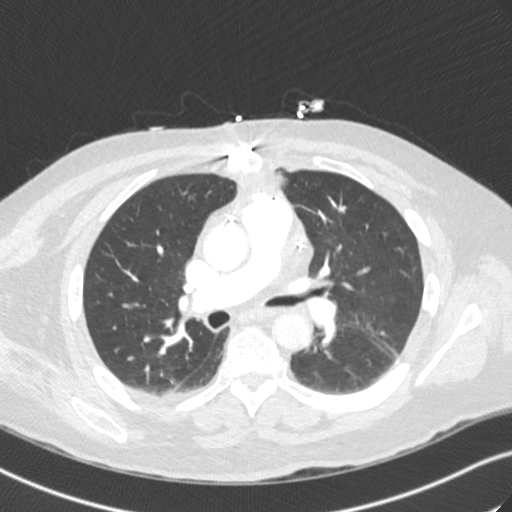
[im 306/434  mediastinal]
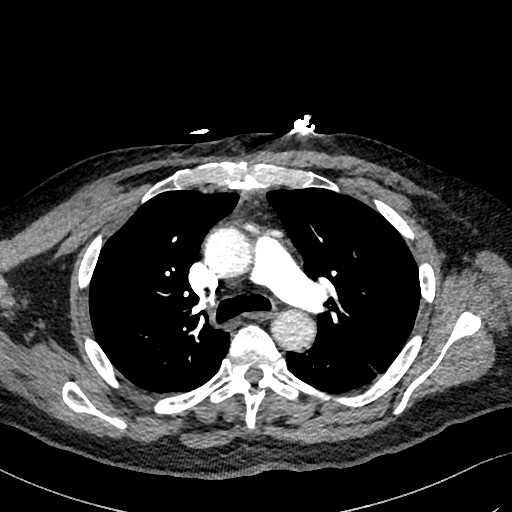
[im 332/434  lung]
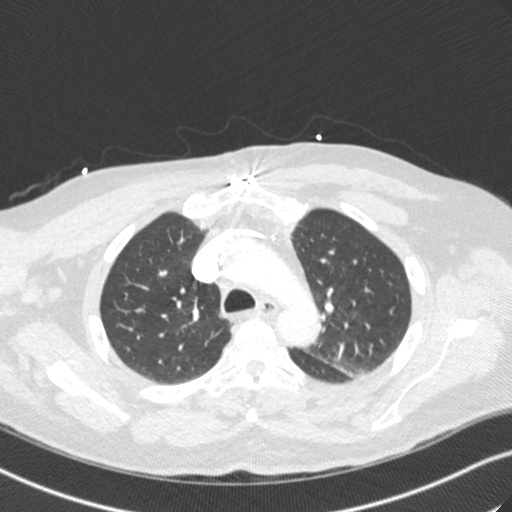
[im 357/434  mediastinal]
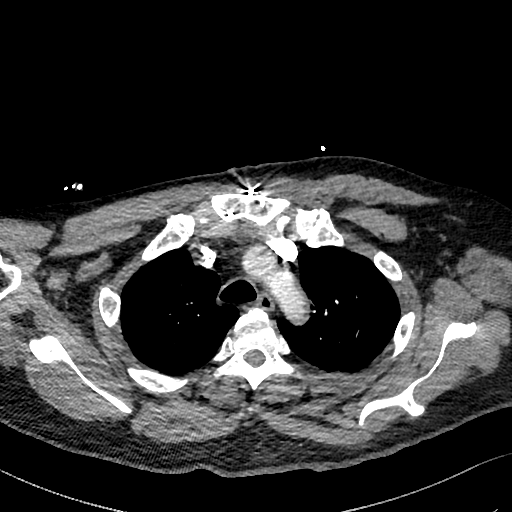
[im 383/434  lung]
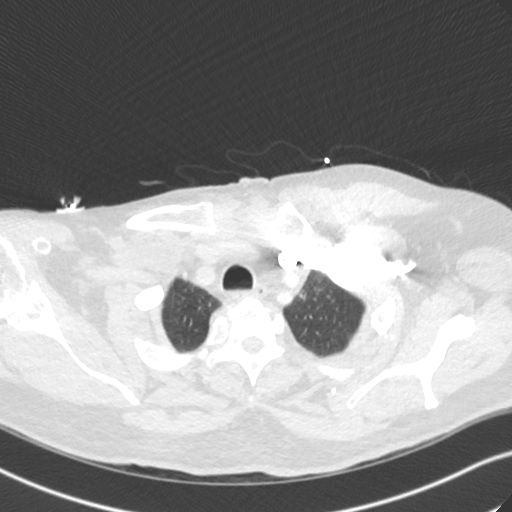
[im 408/434  mediastinal]
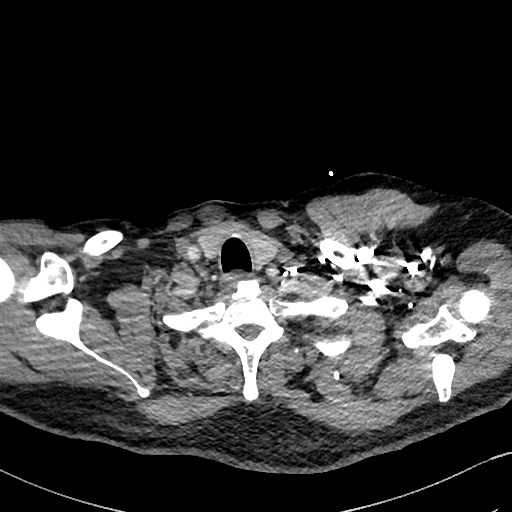

[Series 8: cor · coronal · 0.60mm/px · 1 of 121 slices shown]
[im 61/121  mediastinal]
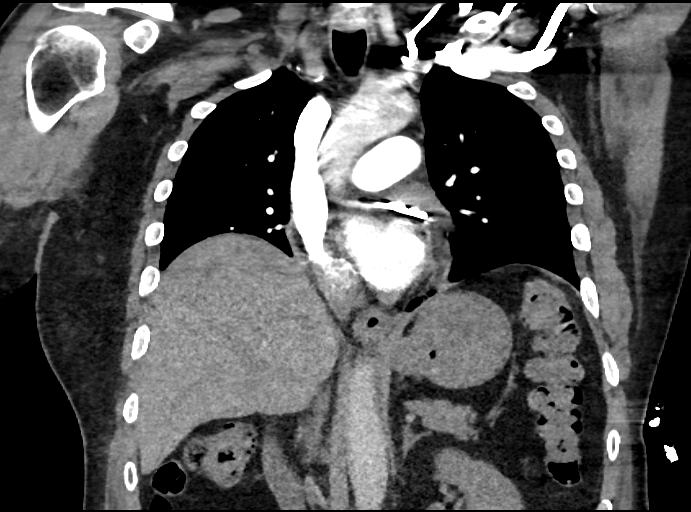

[17 of 36 positions shown; findings below may reference images not displayed]

FINDINGS: Cardiovascular: There is no cardiomegaly or pericardial effusion.
Multi vessel coronary vascular calcification and postsurgical
changes of CABG. Mild anterior mediastinal/retrosternal stranding
related to recent surgery. No fluid collection. There is mild
atherosclerotic calcification of the thoracic aorta. No aneurysmal
dilatation or dissection. Evaluation of the pulmonary arteries is
limited due to respiratory motion artifact. No pulmonary artery
embolus identified.

Mediastinum/Nodes: There is no hilar or mediastinal adenopathy.
Esophagus and the thyroid gland are grossly unremarkable. No
mediastinal fluid collection.

Lungs/Pleura: There is an area of consolidative change with air
bronchogram in the right lower lobe. Focal area of hazy and
ground-glass density in the medial basal segment of the left lower
lobe noted which may represent atelectasis/scarring versus
infiltrate although an area of infarct is not excluded. There is no
pleural effusion or pneumothorax. The central airways are patent.

Upper Abdomen: No acute abnormality.

Musculoskeletal: Median sternotomy wires. Degenerative changes of
the spine. No acute osseous pathology

Review of the MIP images confirms the above findings.
IMPRESSION: 1. No CT evidence of pulmonary embolism.
2. Consolidative changes of the right lower lobe most consistent
with pneumonia. Clinical correlation and follow-up to resolution
recommended. Smaller focal ground-glass density at the left lung
base may also represent infection.
3. Multi vessel coronary vascular calcification and postsurgical
changes of CABG. No mediastinal fluid collection.

## 2019-03-27 IMAGING — CR DG SHOULDER 2+V*R*
3 series · 3 of 3 positions shown · non-contrast
Comparison: None.

CLINICAL DATA: Acute RIGHT shoulder pain for 4 days. No known
injury. Initial encounter.

EXAM:
RIGHT SHOULDER - 2+ VIEW

[shoulder grashey]
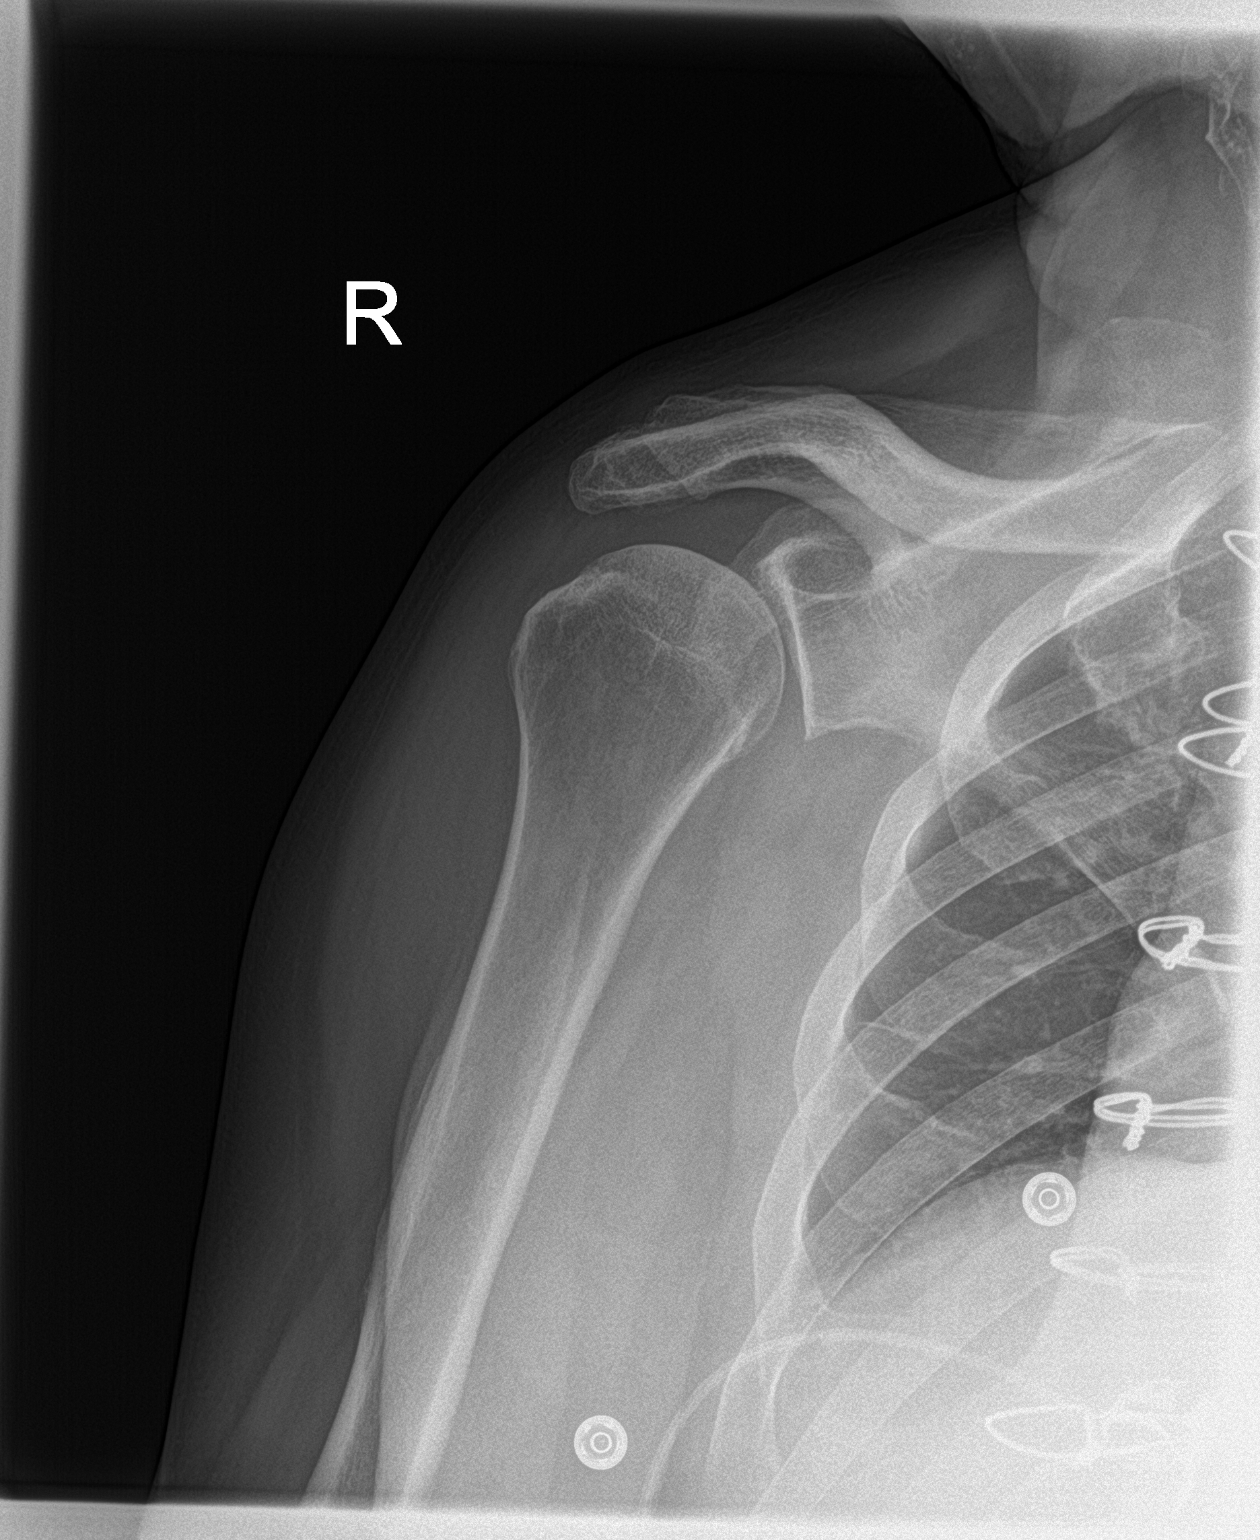

[shoulder y view]
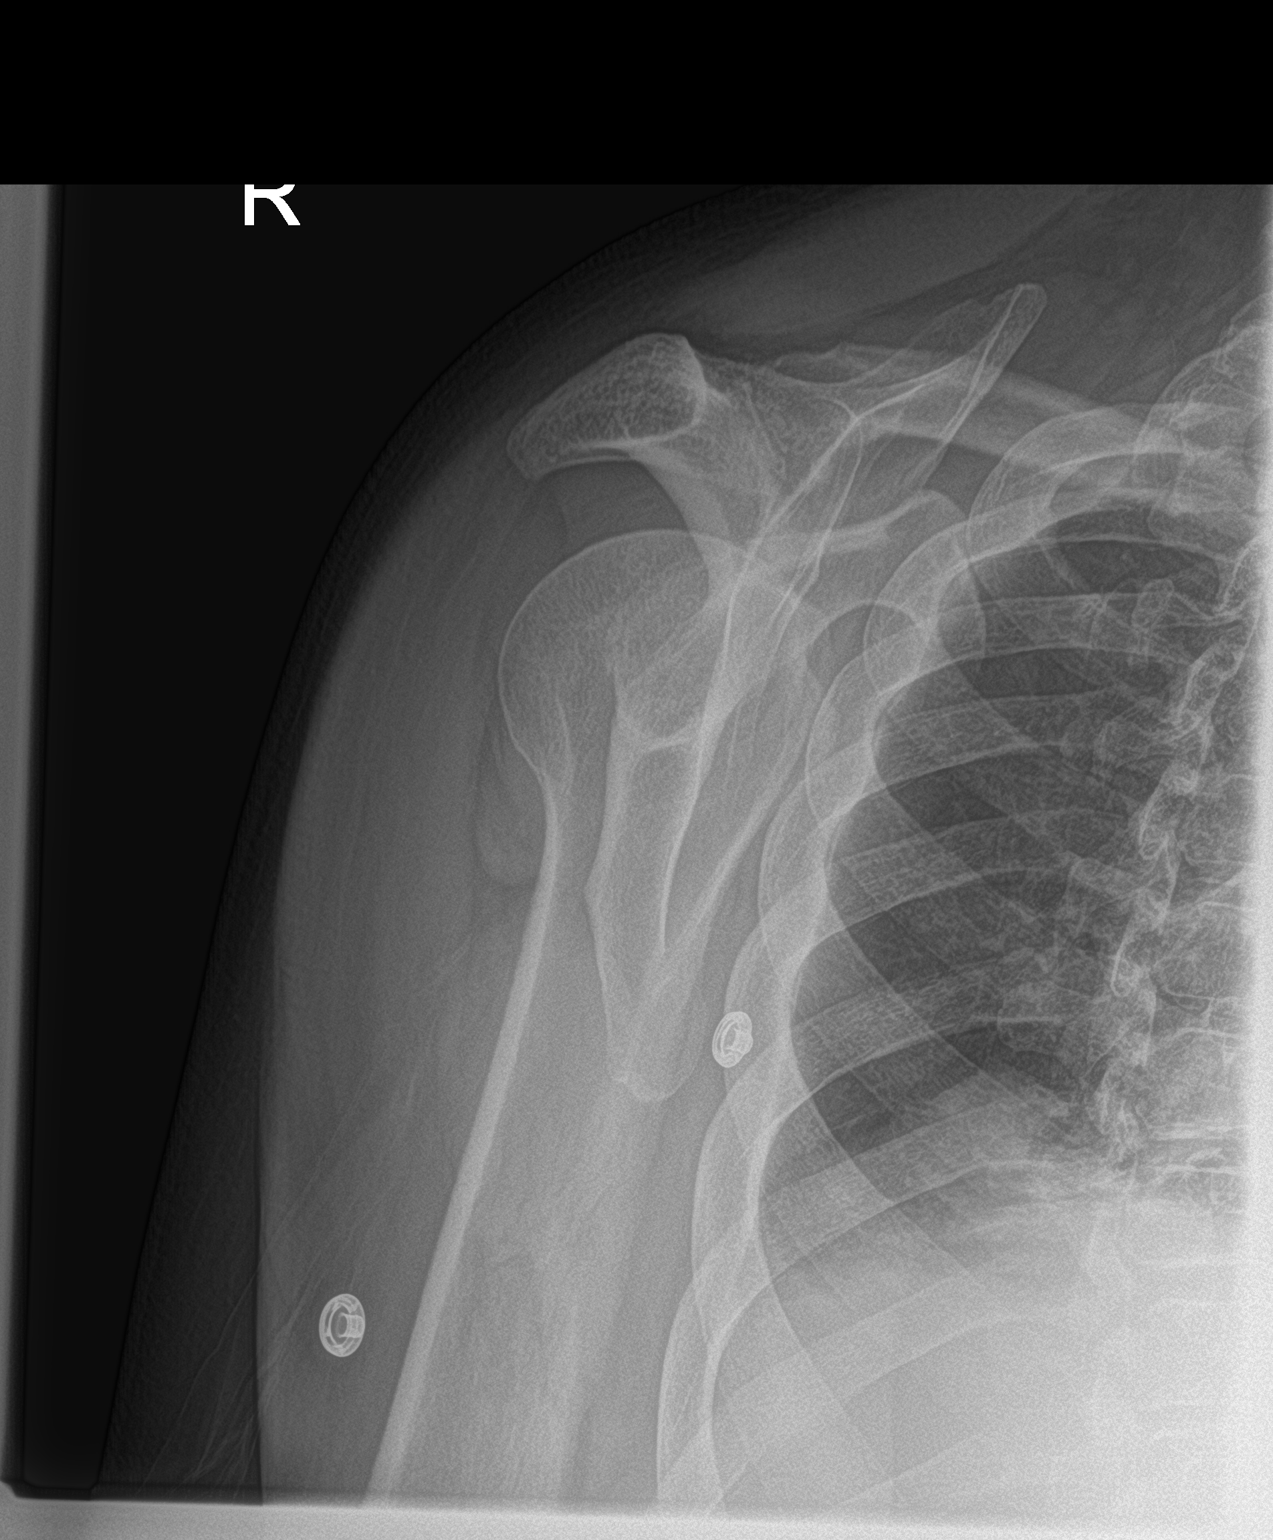

[shoulder axillary]
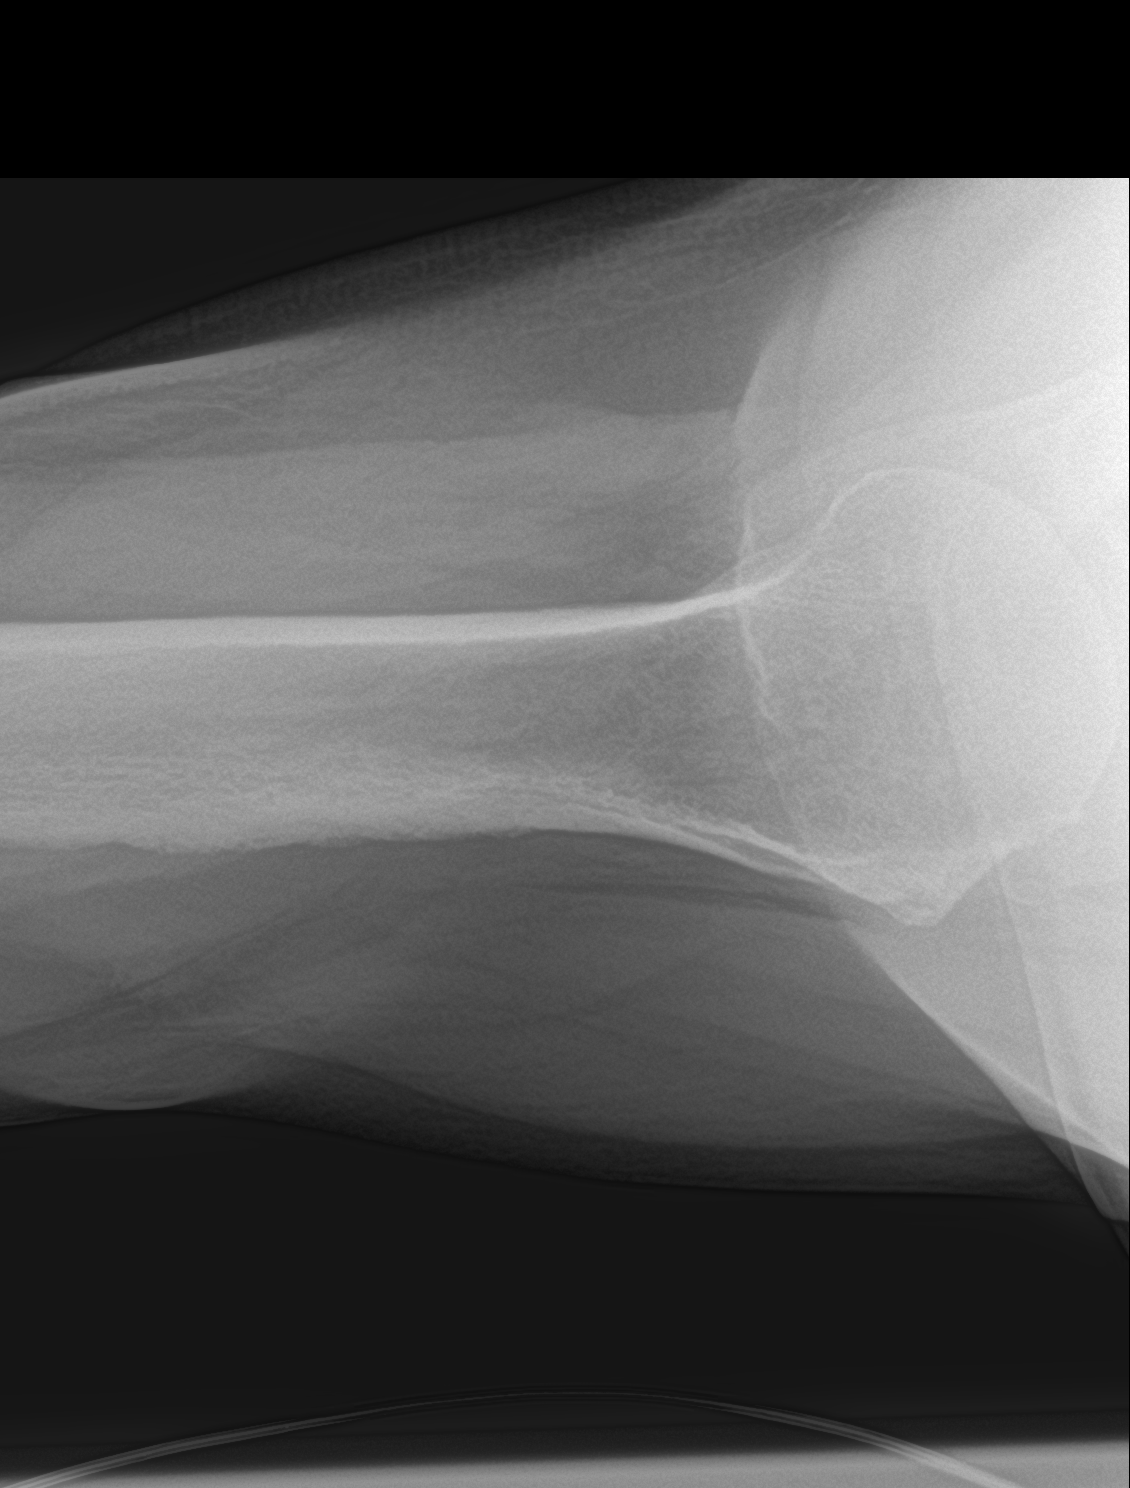

[3 of 3 positions shown; findings below may reference images not displayed]

FINDINGS: There is no evidence of fracture or dislocation. There is no
evidence of arthropathy or other focal bone abnormality. Soft
tissues are unremarkable.
IMPRESSION: Negative.

## 2019-03-29 ENCOUNTER — Other Ambulatory Visit: Payer: Medicare Other

## 2019-03-30 ENCOUNTER — Telehealth: Payer: Self-pay | Admitting: Pharmacy Technician

## 2019-03-30 ENCOUNTER — Telehealth: Payer: Self-pay

## 2019-03-30 MED FILL — ODEFSEY 200-25-25 MG TABS: 200-25-25 | 30 days supply | Qty: 30 | Fill #1

## 2019-03-30 NOTE — Telephone Encounter (Signed)
Patient calling with complaints of his "legs went out on him". Patient states he was suddenly weak and his legs felt weak and he stumbled to the table.  He admits he has not been eating as well as he should due to lack of appetitie . I have advised  the call his primary care physician and describe his symptoms.  He recently had a positive COVID test and has 3 days left on his quarantine.  As for now I have advised the patient to stay hydrated and eat protein. He states he does not have an appetite he was advised to eat small meals throughout the day.   Laverle Patter, RN

## 2019-03-30 NOTE — Telephone Encounter (Signed)
[facility] Patient Advocate Encounter  Patient has now been transferred from the [facility] treatment facility to [facility] in [place]. His case worker, Minta Balsam [phone]) provided me with the local contact for the [facility] area, Judithann Graves [phone]). Elberta Fortis will coordinate his care in regards to medication. We will contact Elberta Fortis each month to pick up patient's medication from [facility] and be delivered to his apartment at the facility. Medication will be picked up around the 23rd of each month while patient is in treatment.   Mailing address for current treatment facility: [address] [place], [place] [address]

## 2019-04-01 IMAGING — CT CT ANGIO CHEST
2 of 6 series · 18 of 36 positions shown · IV contrast (Omni 300)
Comparison: Chest CT angiogram dated 02/13/2018. Chest CT angiogram
dated 04/02/2017.

CLINICAL DATA: LEFT-sided chest pain radiating to RIGHT-sided
chest, starting today. PE suspected, intermediate probability,
positive D-dimer.

EXAM:
CT ANGIOGRAPHY CHEST WITH CONTRAST
TECHNIQUE: Multidetector CT imaging of the chest was performed using the
standard protocol during bolus administration of intravenous
contrast. Multiplanar CT image reconstructions and MIPs were
obtained to evaluate the vascular anatomy.
CONTRAST:  80mL JUWMX7-8H2 IOPAMIDOL (JUWMX7-8H2) INJECTION 76%

[Series 8: pe thins · axial · 0.66mm/px · z∈[+1125,+1354]mm · 17 of 259 slices shown]
[im 15/259  lung]
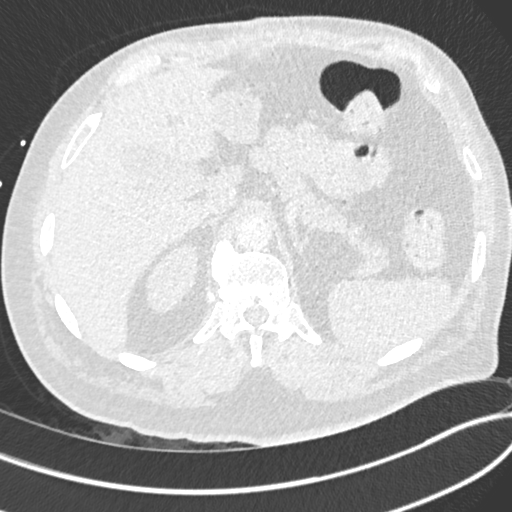
[im 29/259  mediastinal]
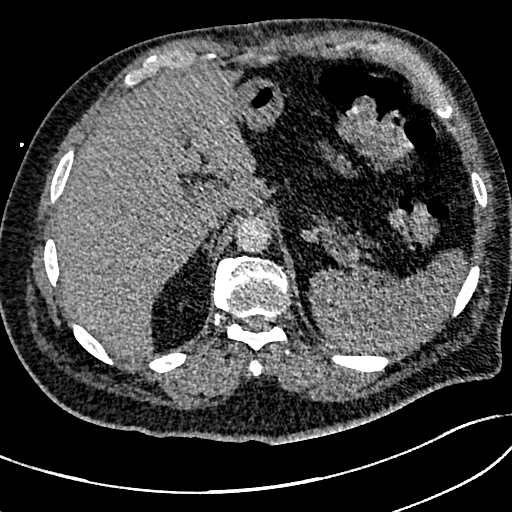
[im 44/259  lung]
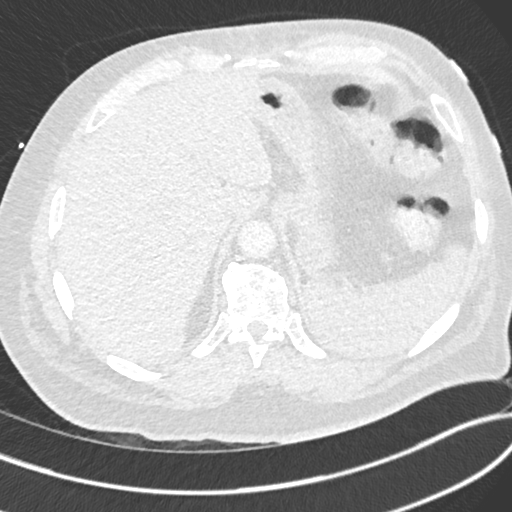
[im 58/259  mediastinal]
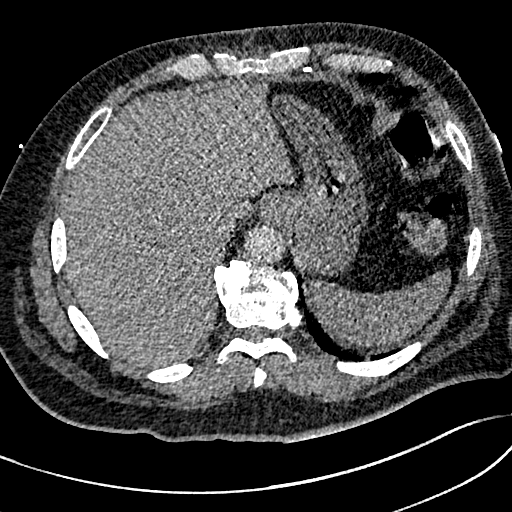
[im 72/259  lung]
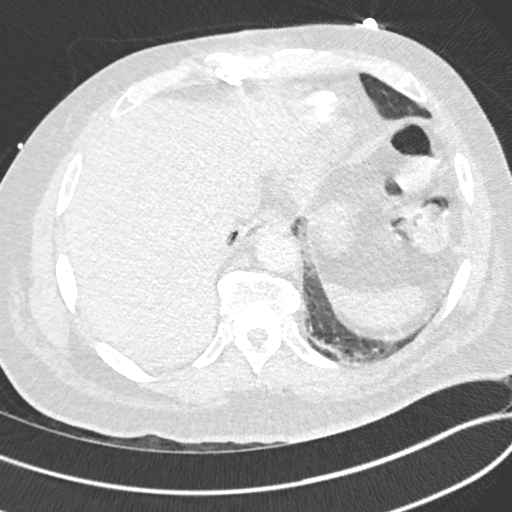
[im 87/259  mediastinal]
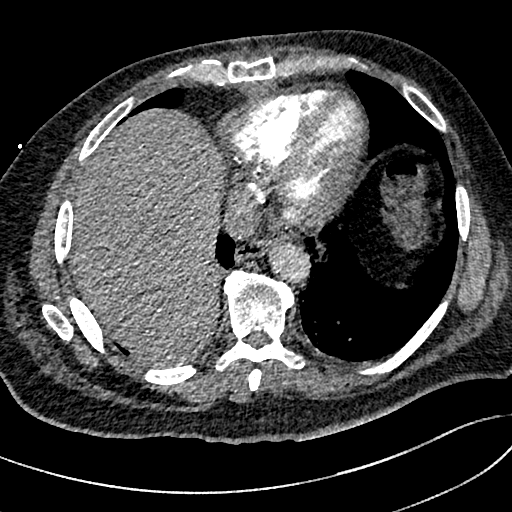
[im 101/259  lung]
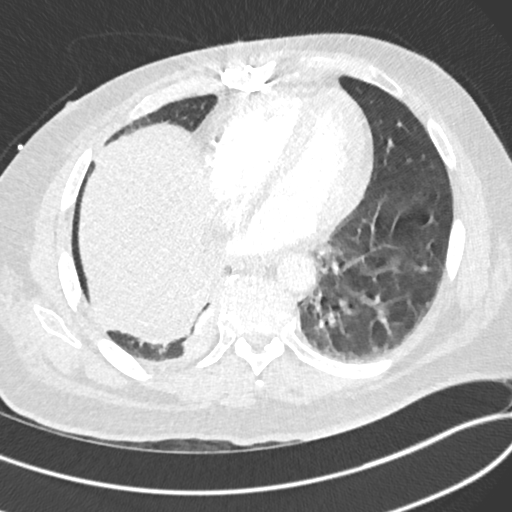
[im 115/259  mediastinal]
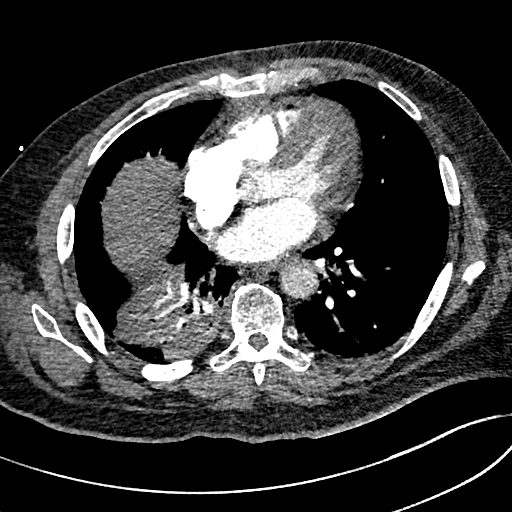
[im 130/259  lung]
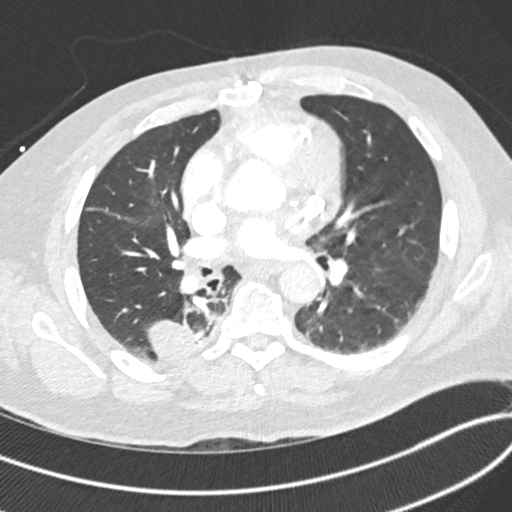
[im 144/259  mediastinal]
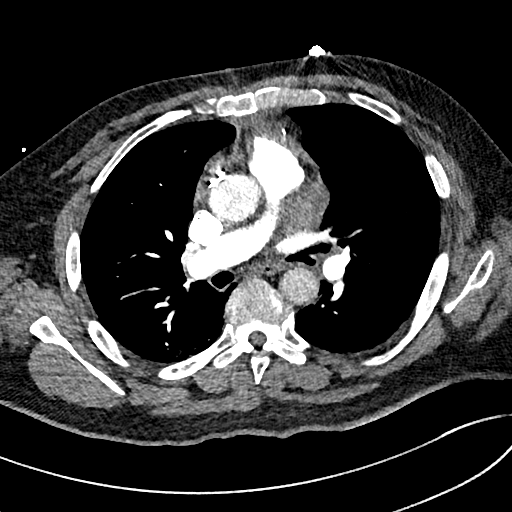
[im 158/259  lung]
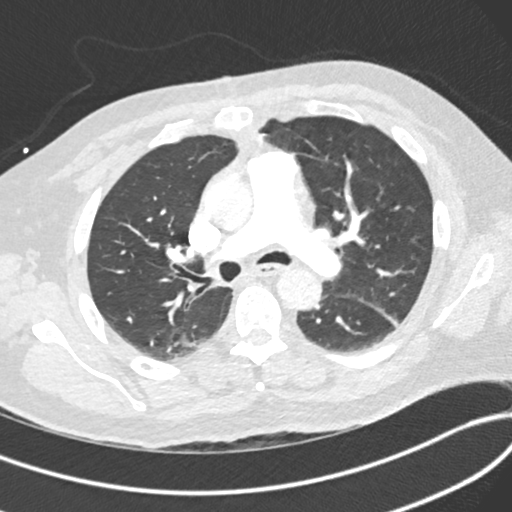
[im 173/259  mediastinal]
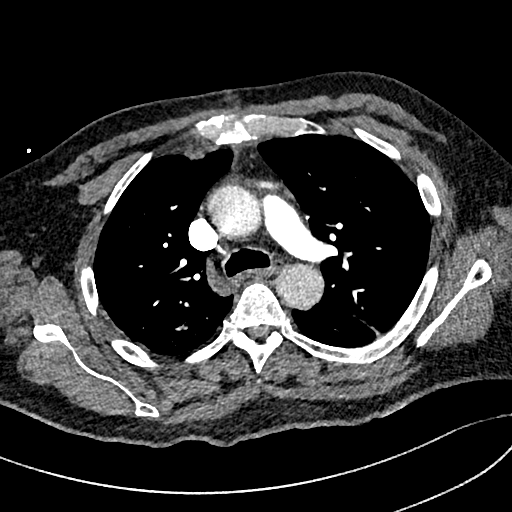
[im 187/259  lung]
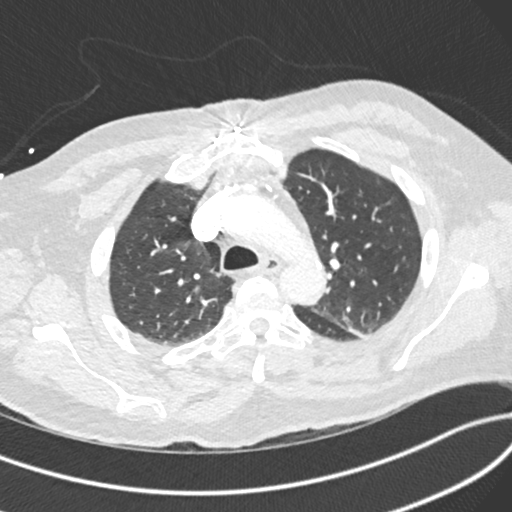
[im 201/259  mediastinal]
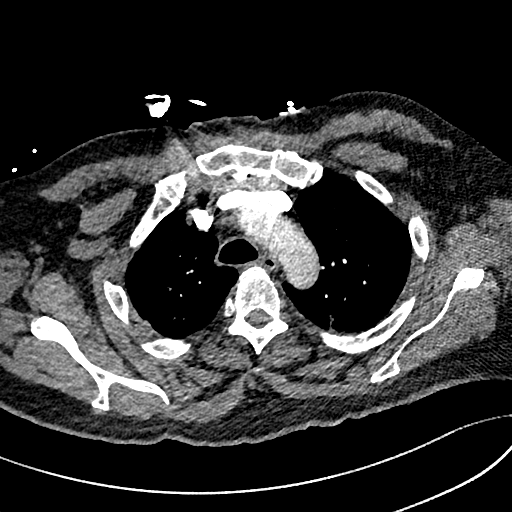
[im 216/259  lung]
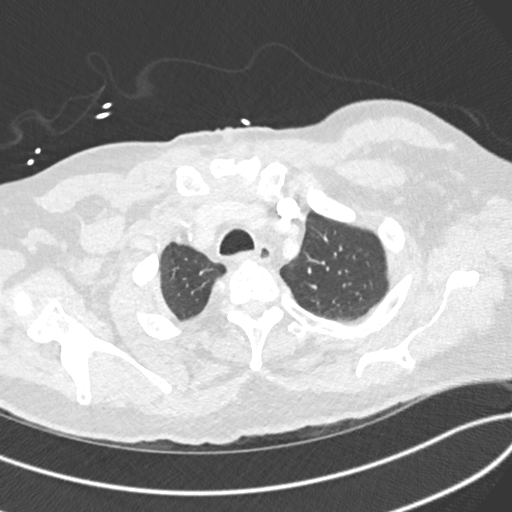
[im 230/259  mediastinal]
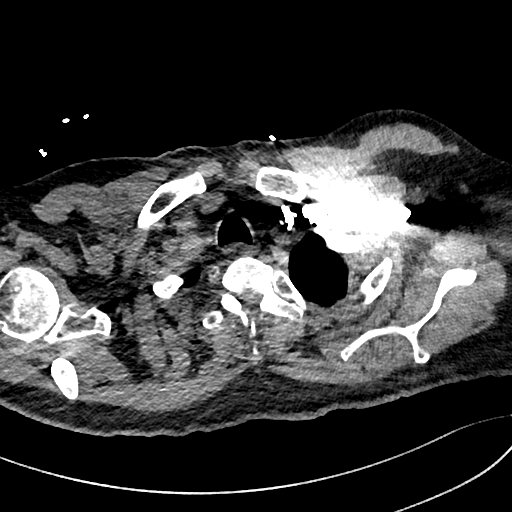
[im 244/259  lung]
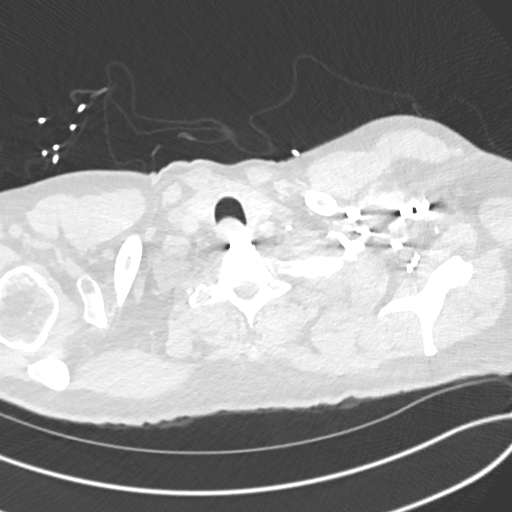

[Series 9: pe 2mm cor · coronal · 0.52mm/px · 1 of 111 slices shown]
[im 56/111  mediastinal]
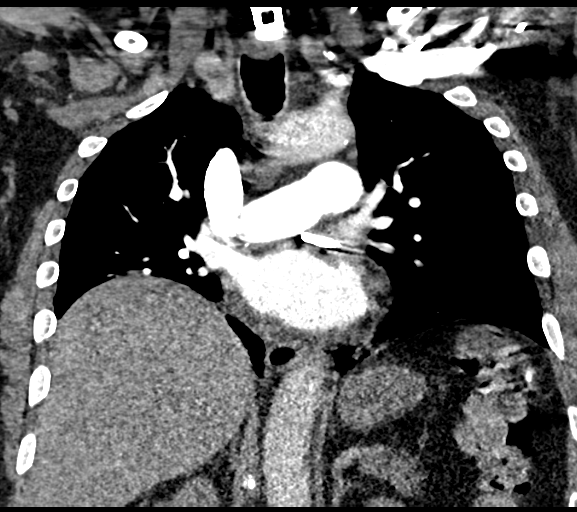

[18 of 36 positions shown; findings below may reference images not displayed]

FINDINGS: Cardiovascular: Evaluation of the peripheral segmental and
subsegmental pulmonary artery branches to the LEFT lower lobe is
limited due to patient breathing motion artifact, however, there is
no pulmonary embolism identified within the main, lobar or central
segmental pulmonary arteries to the LEFT lung. There is no pulmonary
embolism identified within the main, lobar or segmental pulmonary
arteries to the RIGHT lung.

Heart size is within normal limits. No pericardial effusion. No
thoracic aortic aneurysm. Scattered mild aortic atherosclerosis.
Status post median sternotomy for presumed CABG.

Mediastinum/Nodes: No mass or enlarged lymph nodes seen within the
mediastinum or perihilar regions. Esophagus appears normal. Trachea
and central bronchi are unremarkable.

Lungs/Pleura: Persistent dense consolidation within the RIGHT lower
lobe, posterior aspect, not appreciably changed compared to the most
recent chest CT angiogram of 02/13/2018, new compared to the earlier
chest CT of 04/02/2017.

No new lung abnormality.  No pleural effusion or pneumothorax.

Upper Abdomen: Limited images of the upper abdomen are unremarkable.

Musculoskeletal: Mild degenerative spondylosis throughout the mid
and lower thoracic spine. No acute or suspicious osseous finding.

Review of the MIP images confirms the above findings.
IMPRESSION: 1. No pulmonary embolism seen, with mild study limitations detailed
above.
2. Persistent dense consolidation within the RIGHT lower lobe,
stable, most likely pneumonia. Consider follow-up chest CT in 4-6
weeks to ensure resolution.

Aortic Atherosclerosis ([id number]).

## 2019-04-01 IMAGING — CR DG CHEST 2V
2 series · 2 of 2 positions shown · non-contrast
Comparison: Chest radiograph 04/01/17

CLINICAL DATA: Sudden onset sharp chest pain, short of breath

EXAM:
CHEST - 2 VIEW

[chest lat]
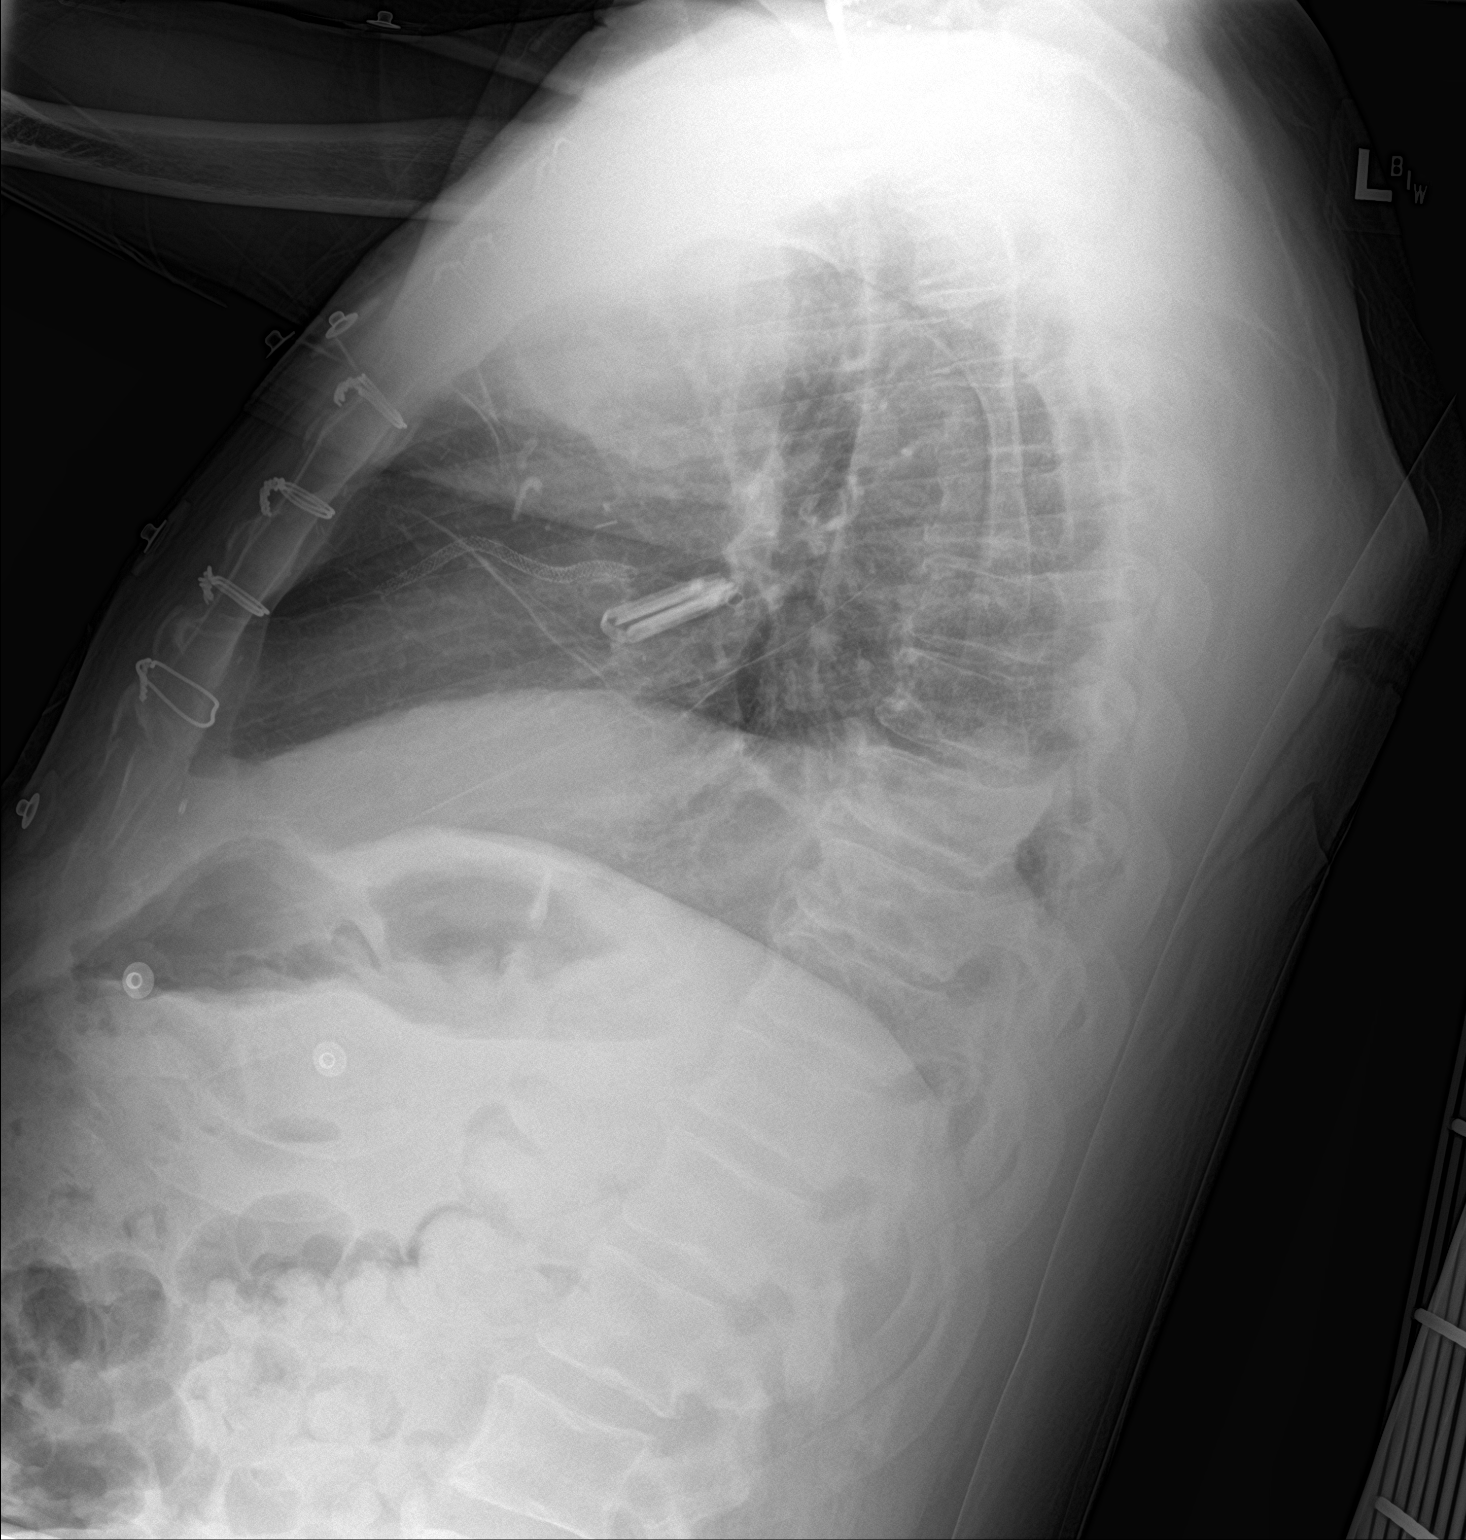

[chest ap]
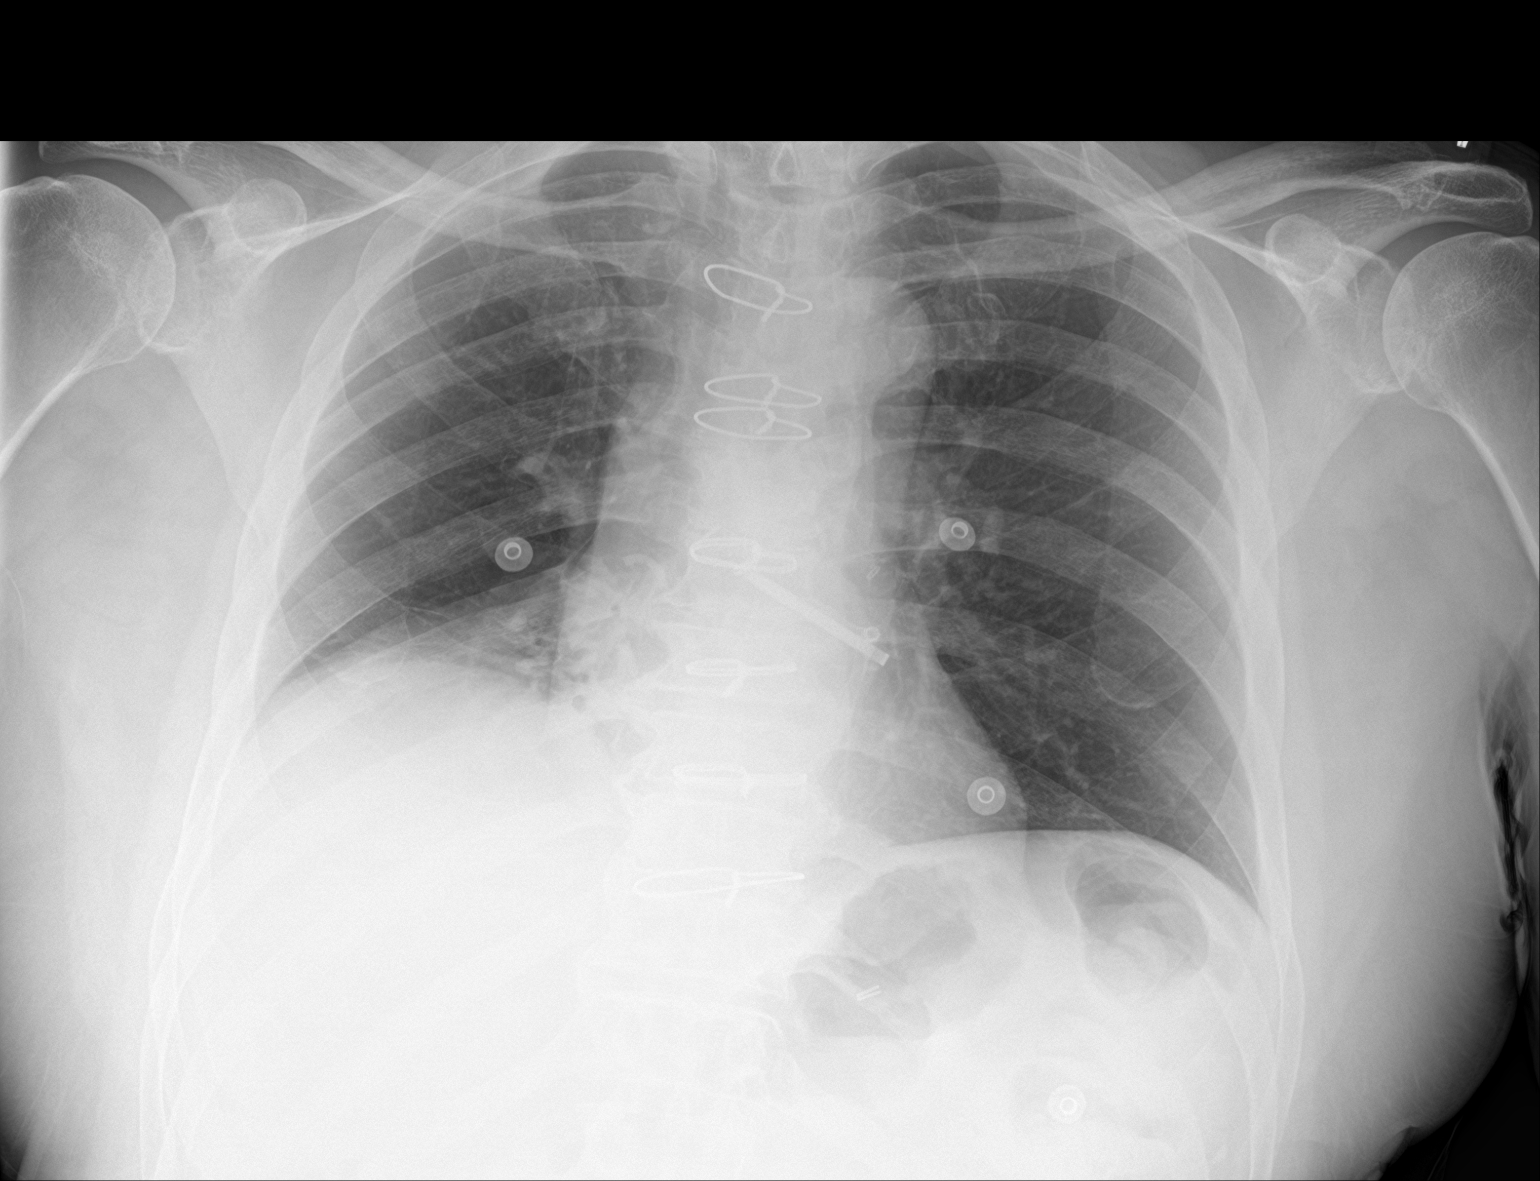

[2 of 2 positions shown; findings below may reference images not displayed]

FINDINGS: Sternotomy wires overlie normal cardiac silhouette. There is
elevation of the RIGHT hemidiaphragm which is similar to the recent
CT and new from chest x-ray of 04/01/2017. RIGHT lower lobe
consolidations similar to comparison CT. LEFT lung clear.
IMPRESSION: Persistent RIGHT lower lobe consolidation. Recommend follow-up CT
with contrast to evaluate after appropriate therapy (3-4 weeks).

Persistent elevation of the RIGHT hemidiaphragm new from radiograph
04/01/2017

## 2019-04-03 IMAGING — CT CT PELVIS W/O CM
2 of 3 series · 17 of 46 positions shown, 19 images · non-contrast
Comparison: Scrotal ultrasound February 20, 2018 and CT abdomen and
pelvis September 21, 2015

CLINICAL DATA: Follow-up epididymitis orchitis. Assess for free
air.

EXAM:
CT PELVIS WITHOUT CONTRAST
TECHNIQUE: Multidetector CT imaging of the pelvis was performed following the
standard protocol without intravenous contrast.

[Series 3: pelvis w/o 5.0 (person_name) · axial · non-contrast · 0.66mm/px · z∈[+1060,+1265]mm · 14 of 49 slices shown, 16 images]
[im 4/49  soft-tissue]
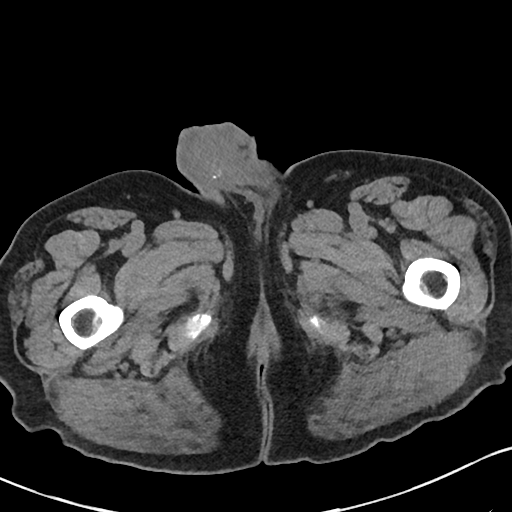
[im 4/49  bone]
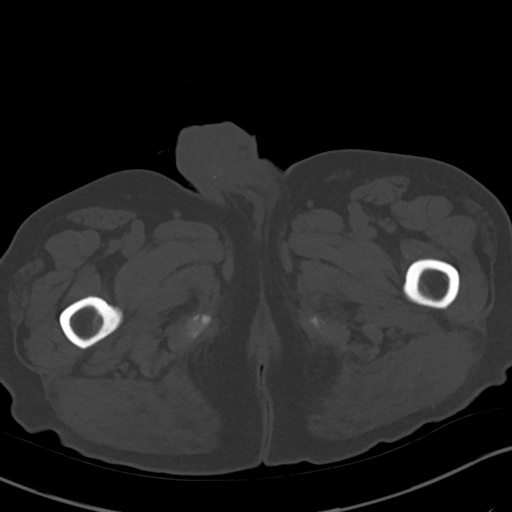
[im 7/49  soft-tissue]
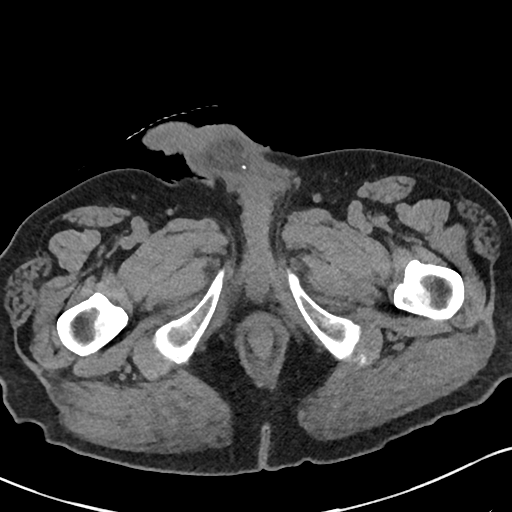
[im 10/49  soft-tissue]
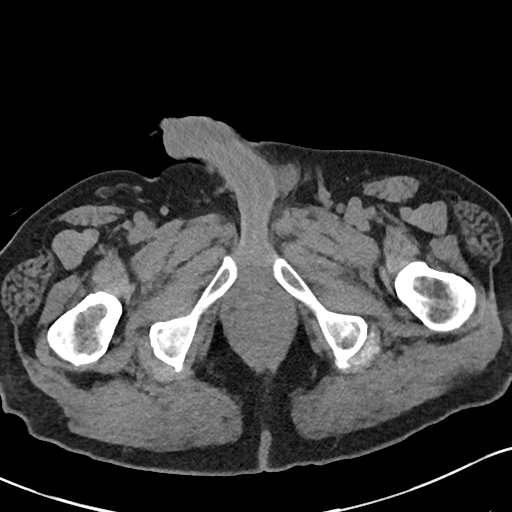
[im 13/49  soft-tissue]
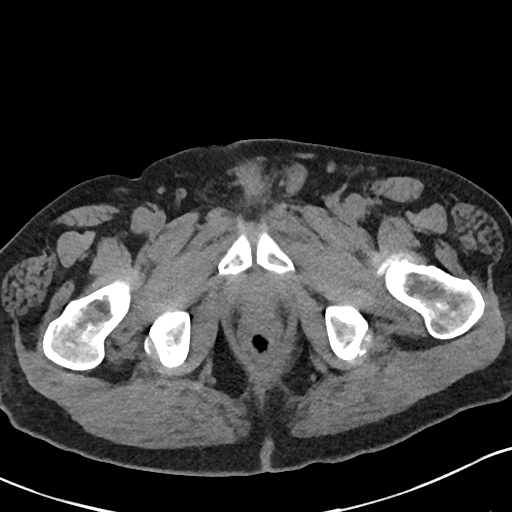
[im 16/49  soft-tissue]
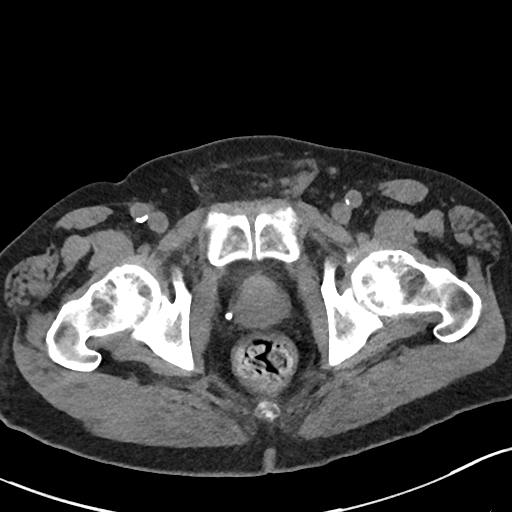
[im 19/49  soft-tissue]
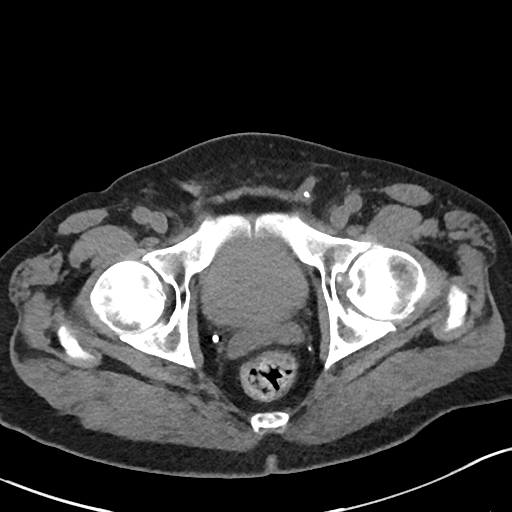
[im 22/49  soft-tissue]
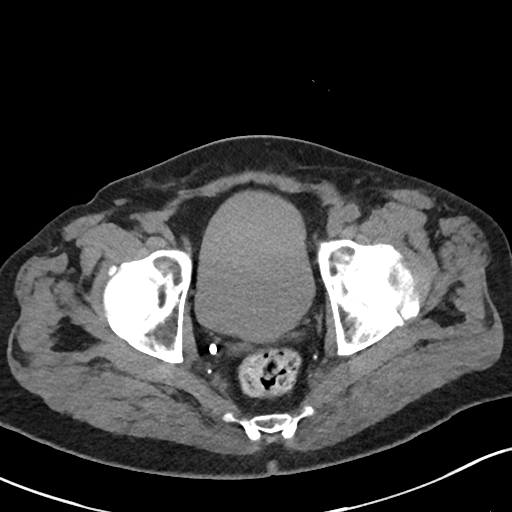
[im 27/49  soft-tissue]
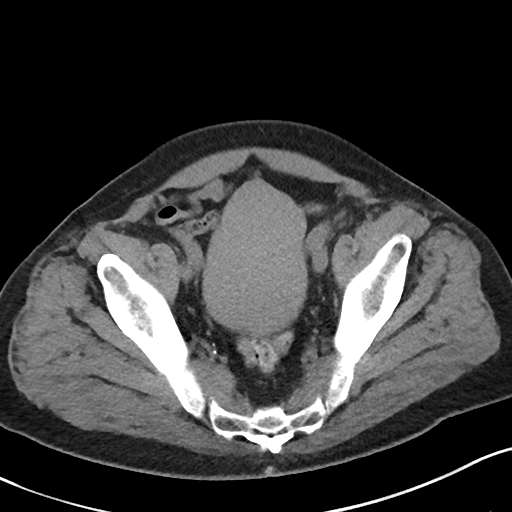
[im 30/49  soft-tissue]
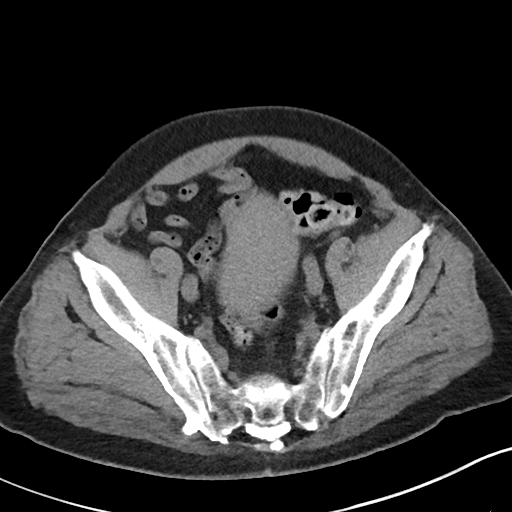
[im 30/49  bone]
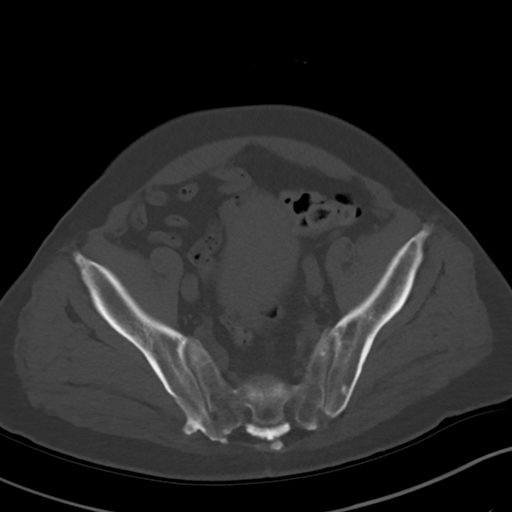
[im 33/49  soft-tissue]
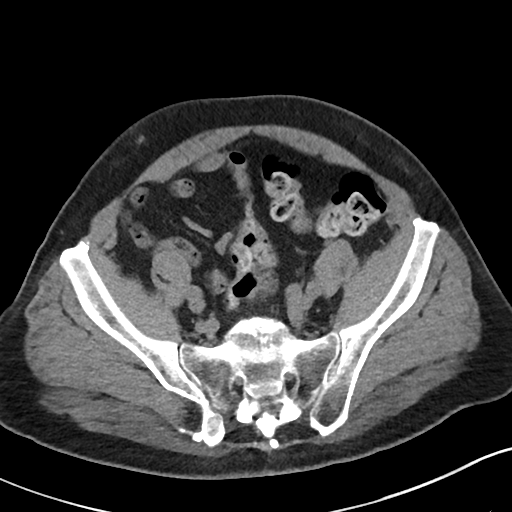
[im 36/49  soft-tissue]
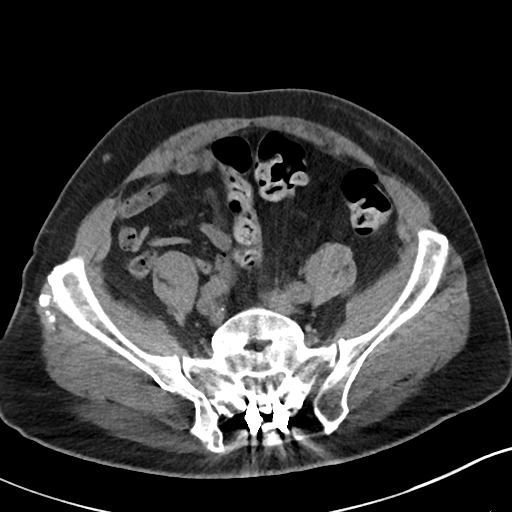
[im 39/49  soft-tissue]
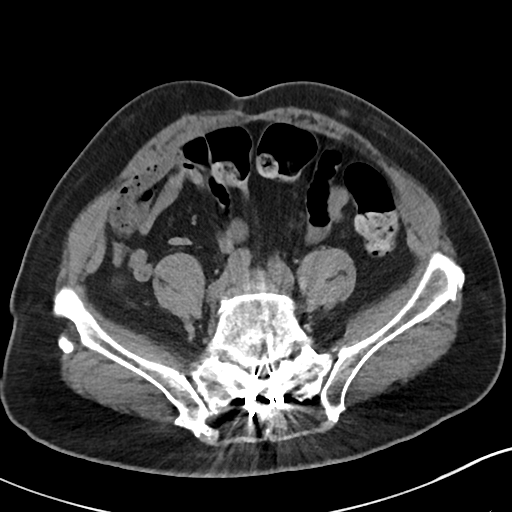
[im 42/49  soft-tissue]
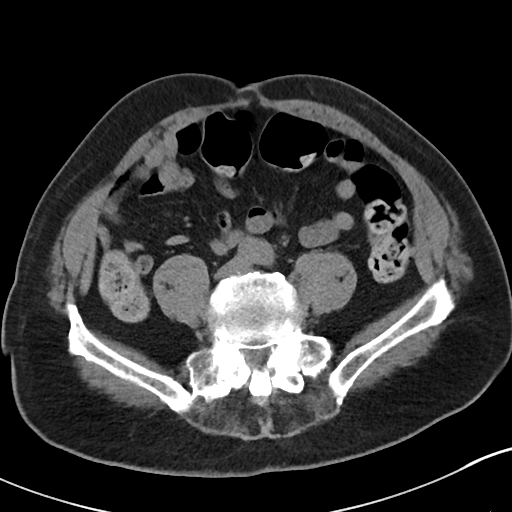
[im 45/49  soft-tissue]
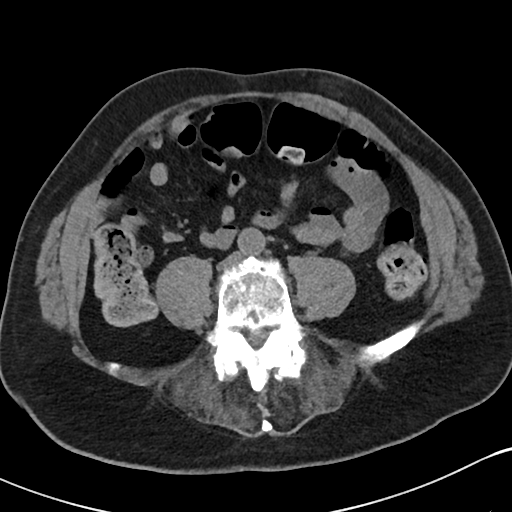

[Series 5: pelvis w/o 2.0 cor · coronal · non-contrast · 0.51mm/px · 3 of 151 slices shown]
[im 51/151  soft-tissue]
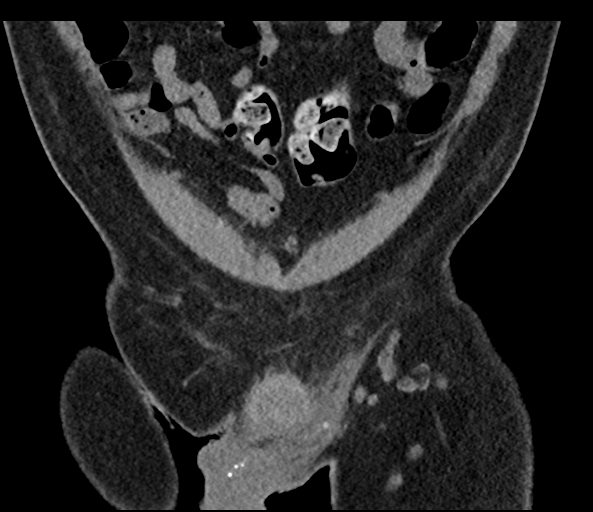
[im 67/151  soft-tissue]
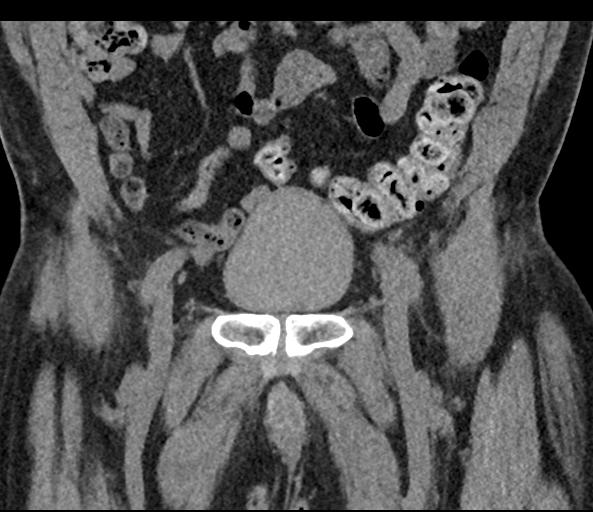
[im 84/151  soft-tissue]
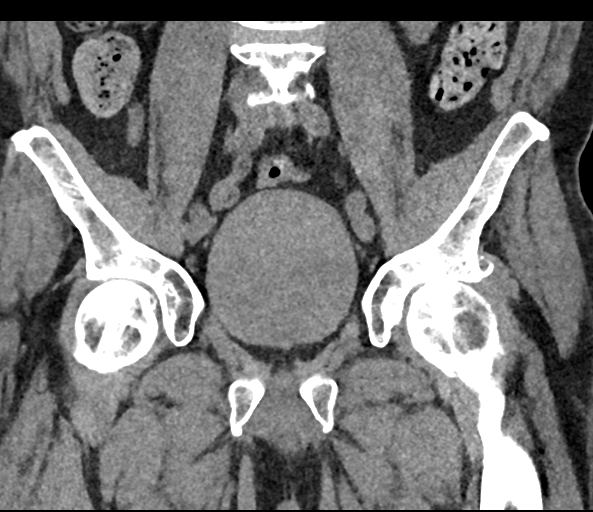

[17 of 46 positions shown; findings below may reference images not displayed]

FINDINGS: Urinary Tract:  Well distended, normal.

Bowel: Moderate amount of retained large bowel stool. Included small
and large bowel are normal in course and caliber.

Vascular/Lymphatic: Included aortoiliac vessels are normal in course
and caliber, mild calcific atherosclerosis

Reproductive:  Normal.

Other: Partially imaged thickened scrotum. Please note, scrotum has
not been fully imaged. Partially imaged perineum is unremarkable.
Small amount of free fluid in the LEFT inguinal canal. No
intraperitoneal free fluid or free air in the pelvis. Small fat
containing umbilical hernia. Phleboliths in the pelvis.

Musculoskeletal: Nonacute. Status post L5-S1 PLIF with arthrodesis,
intact laminar hooks and cerclage wires. Severe canal stenosis L4-5,
severe L4-5 neural foraminal narrowing.
IMPRESSION: 1. Scrotum and perineum incompletely imaged. No subcutaneous
emphysema with included pelvis.
2. L5-S1 PLIF, adjacent segment disease with severe L4-5 canal
stenosis and severe L4-5 neural foraminal narrowing.

Aortic Atherosclerosis (HN211-HV2.2).

## 2019-04-06 ENCOUNTER — Encounter: Payer: Medicare Other | Admitting: Infectious Diseases

## 2019-04-11 ENCOUNTER — Other Ambulatory Visit: Payer: Self-pay

## 2019-04-11 ENCOUNTER — Encounter (HOSPITAL_COMMUNITY): Payer: Self-pay

## 2019-04-11 ENCOUNTER — Emergency Department (HOSPITAL_COMMUNITY): Payer: Medicare Other

## 2019-04-11 ENCOUNTER — Inpatient Hospital Stay (HOSPITAL_COMMUNITY)
Admission: EM | Admit: 2019-04-11 | Discharge: 2019-04-21 | DRG: 975 | Disposition: A | Discharge status: Home / Self Care | Payer: Medicare Other | Attending: Internal Medicine | Admitting: Internal Medicine

## 2019-04-11 DIAGNOSIS — I4891 Unspecified atrial fibrillation: Secondary | ICD-10-CM | POA: Diagnosis present

## 2019-04-11 DIAGNOSIS — J1282 Pneumonia due to coronavirus disease 2019: Secondary | ICD-10-CM | POA: Insufficient documentation

## 2019-04-11 DIAGNOSIS — N179 Acute kidney failure, unspecified: Secondary | ICD-10-CM | POA: Diagnosis present

## 2019-04-11 DIAGNOSIS — IMO0002 Reserved for concepts with insufficient information to code with codable children: Secondary | ICD-10-CM | POA: Diagnosis present

## 2019-04-11 DIAGNOSIS — Z8349 Family history of other endocrine, nutritional and metabolic diseases: Secondary | ICD-10-CM

## 2019-04-11 DIAGNOSIS — R066 Hiccough: Secondary | ICD-10-CM | POA: Diagnosis present

## 2019-04-11 DIAGNOSIS — Z590 Homelessness: Secondary | ICD-10-CM

## 2019-04-11 DIAGNOSIS — E1122 Type 2 diabetes mellitus with diabetic chronic kidney disease: Secondary | ICD-10-CM | POA: Diagnosis present

## 2019-04-11 DIAGNOSIS — Z86711 Personal history of pulmonary embolism: Secondary | ICD-10-CM | POA: Diagnosis not present

## 2019-04-11 DIAGNOSIS — Z7951 Long term (current) use of inhaled steroids: Secondary | ICD-10-CM

## 2019-04-11 DIAGNOSIS — J1289 Other viral pneumonia: Secondary | ICD-10-CM | POA: Diagnosis present

## 2019-04-11 DIAGNOSIS — Z794 Long term (current) use of insulin: Secondary | ICD-10-CM

## 2019-04-11 DIAGNOSIS — R1310 Dysphagia, unspecified: Secondary | ICD-10-CM | POA: Diagnosis present

## 2019-04-11 DIAGNOSIS — E785 Hyperlipidemia, unspecified: Secondary | ICD-10-CM | POA: Diagnosis present

## 2019-04-11 DIAGNOSIS — E1140 Type 2 diabetes mellitus with diabetic neuropathy, unspecified: Secondary | ICD-10-CM | POA: Diagnosis present

## 2019-04-11 DIAGNOSIS — Z8249 Family history of ischemic heart disease and other diseases of the circulatory system: Secondary | ICD-10-CM

## 2019-04-11 DIAGNOSIS — I2510 Atherosclerotic heart disease of native coronary artery without angina pectoris: Secondary | ICD-10-CM | POA: Diagnosis present

## 2019-04-11 DIAGNOSIS — U071 COVID-19: Principal | ICD-10-CM | POA: Diagnosis present

## 2019-04-11 DIAGNOSIS — E1165 Type 2 diabetes mellitus with hyperglycemia: Secondary | ICD-10-CM | POA: Diagnosis present

## 2019-04-11 DIAGNOSIS — I129 Hypertensive chronic kidney disease with stage 1 through stage 4 chronic kidney disease, or unspecified chronic kidney disease: Secondary | ICD-10-CM | POA: Diagnosis present

## 2019-04-11 DIAGNOSIS — R739 Hyperglycemia, unspecified: Secondary | ICD-10-CM | POA: Diagnosis present

## 2019-04-11 DIAGNOSIS — Z951 Presence of aortocoronary bypass graft: Secondary | ICD-10-CM

## 2019-04-11 DIAGNOSIS — M109 Gout, unspecified: Secondary | ICD-10-CM | POA: Diagnosis present

## 2019-04-11 DIAGNOSIS — N183 Chronic kidney disease, stage 3 unspecified: Secondary | ICD-10-CM | POA: Diagnosis present

## 2019-04-11 DIAGNOSIS — J45909 Unspecified asthma, uncomplicated: Secondary | ICD-10-CM

## 2019-04-11 DIAGNOSIS — K5900 Constipation, unspecified: Secondary | ICD-10-CM | POA: Diagnosis present

## 2019-04-11 DIAGNOSIS — E869 Volume depletion, unspecified: Secondary | ICD-10-CM | POA: Diagnosis present

## 2019-04-11 DIAGNOSIS — R0603 Acute respiratory distress: Secondary | ICD-10-CM | POA: Diagnosis present

## 2019-04-11 DIAGNOSIS — F1410 Cocaine abuse, uncomplicated: Secondary | ICD-10-CM | POA: Diagnosis present

## 2019-04-11 DIAGNOSIS — K209 Esophagitis, unspecified: Secondary | ICD-10-CM | POA: Diagnosis present

## 2019-04-11 DIAGNOSIS — Z833 Family history of diabetes mellitus: Secondary | ICD-10-CM

## 2019-04-11 DIAGNOSIS — B20 Human immunodeficiency virus [HIV] disease: Secondary | ICD-10-CM | POA: Diagnosis present

## 2019-04-11 DIAGNOSIS — Z955 Presence of coronary angioplasty implant and graft: Secondary | ICD-10-CM

## 2019-04-11 DIAGNOSIS — F329 Major depressive disorder, single episode, unspecified: Secondary | ICD-10-CM | POA: Diagnosis present

## 2019-04-11 DIAGNOSIS — E8809 Other disorders of plasma-protein metabolism, not elsewhere classified: Secondary | ICD-10-CM | POA: Diagnosis present

## 2019-04-11 DIAGNOSIS — Z7982 Long term (current) use of aspirin: Secondary | ICD-10-CM

## 2019-04-11 LAB — COMPREHENSIVE METABOLIC PANEL
ALT: 11 U/L (ref 0–44)
AST: 15 U/L (ref 15–41)
Albumin: 3.2 g/dL — ABNORMAL LOW (ref 3.5–5.0)
Alkaline Phosphatase: 72 U/L (ref 38–126)
Anion gap: 12 (ref 5–15)
BUN: 39 mg/dL — ABNORMAL HIGH (ref 8–23)
CO2: 20 mmol/L — ABNORMAL LOW (ref 22–32)
Calcium: 7.5 mg/dL — ABNORMAL LOW (ref 8.9–10.3)
Chloride: 97 mmol/L — ABNORMAL LOW (ref 98–111)
Creatinine, Ser: 2.03 mg/dL — ABNORMAL HIGH (ref 0.61–1.24)
GFR calc Af Amer: 38 mL/min — ABNORMAL LOW (ref >60)
GFR calc non Af Amer: 33 mL/min — ABNORMAL LOW (ref >60)
Glucose, Bld: 591 mg/dL (ref 70–99)
Potassium: 4 mmol/L (ref 3.5–5.1)
Sodium: 129 mmol/L — ABNORMAL LOW (ref 135–145)
Total Bilirubin: 0.7 mg/dL (ref 0.3–1.2)
Total Protein: 7 g/dL (ref 6.5–8.1)

## 2019-04-11 LAB — TROPONIN I (HIGH SENSITIVITY)
Troponin I (High Sensitivity): 5 ng/L (ref <18)
Troponin I (High Sensitivity): 5 ng/L (ref <18)

## 2019-04-11 LAB — GLUCOSE, CAPILLARY: Glucose-Capillary: 348 mg/dL — ABNORMAL HIGH (ref 70–99)

## 2019-04-11 LAB — CBC
HCT: 35.2 % — ABNORMAL LOW (ref 39.0–52.0)
Hemoglobin: 11.2 g/dL — ABNORMAL LOW (ref 13.0–17.0)
MCH: 26 pg (ref 26.0–34.0)
MCHC: 31.8 g/dL (ref 30.0–36.0)
MCV: 81.7 fL (ref 80.0–100.0)
Platelets: 178 10*3/uL (ref 150–400)
RBC: 4.31 MIL/uL (ref 4.22–5.81)
RDW: 13.2 % (ref 11.5–15.5)
WBC: 5.3 10*3/uL (ref 4.0–10.5)
nRBC: 0 % (ref 0.0–0.2)

## 2019-04-11 LAB — CBG MONITORING, ED
Glucose-Capillary: 180 mg/dL — ABNORMAL HIGH (ref 70–99)
Glucose-Capillary: 246 mg/dL — ABNORMAL HIGH (ref 70–99)
Glucose-Capillary: 262 mg/dL — ABNORMAL HIGH (ref 70–99)

## 2019-04-11 LAB — BRAIN NATRIURETIC PEPTIDE: B Natriuretic Peptide: 66.1 pg/mL (ref 0.0–100.0)

## 2019-04-11 LAB — SARS CORONAVIRUS 2 BY RT PCR (HOSPITAL ORDER, PERFORMED IN ~~LOC~~ HOSPITAL LAB): SARS Coronavirus 2: POSITIVE — AB

## 2019-04-11 MED ORDER — INSULIN REGULAR(HUMAN) IN NACL 100-0.9 UT/100ML-% IV SOLN
INTRAVENOUS | Status: DC
  Administered 2019-04-11: 5.3 [IU]/h via INTRAVENOUS
  Filled 2019-04-11: qty 100

## 2019-04-11 MED ORDER — SODIUM CHLORIDE 0.9 % IV SOLN: 1000.0000 mL | INTRAVENOUS | Status: DC

## 2019-04-11 MED ORDER — SODIUM CHLORIDE 0.9 % IV BOLUS (SEPSIS)
1000.0000 mL | Freq: Once | INTRAVENOUS | Status: AC
  Administered 2019-04-11: 19:00:00 1000 mL via INTRAVENOUS

## 2019-04-11 MED ORDER — ONDANSETRON HCL 4 MG/2ML IJ SOLN: 4.0000 mg | Freq: Once | INTRAMUSCULAR | Status: DC

## 2019-04-11 NOTE — ED Triage Notes (Signed)
Pt states that he has been short of breath and can't catch his breath . Pt states that he is feeling worse than when he was d/c from [facility]. Pt was dx with covid 03/19/19. Pt c/o chest pain 7/10. Pt still does not have an appetite.

## 2019-04-11 NOTE — H&P (Signed)
History and Physical  Richrd Kuzniar LKT:[id number] DOB: [birth year] DOA: 04/11/2019  Referring physician: Ovid Curd, Dr. Tomi Bamberger PCP: [name], PA-C  Outpatient Specialists:    Patient coming from: Home  Chief Complaint: Shortness of breath, weakness and fatigue.    HPI:  Patient is a 68 year old African American male with past medical history significant for recent COVID-19 illness for which patient was admitted and discharged from the hospital about 2 weeks ago, pulmonary embolism, hypertension, hyperlipidemia, HIV, gout, diabetes mellitus, depression, crack cocaine use, coronary artery disease, asthma and atrial fibrillation.  Patient presents with new onset of shortness of breath, weakness and fatigue that started about 3 days ago.  Patient also reports pain light headache, dizzy, with associated nausea, vomiting and diarrhea.  Repeat COVID-19 test is positive.  Significantly, patient blood sugar is 591, with worsening serum creatinine (up from 1.48 to 2.03), CO2 was 20 but no anion gap.  No headache, no neck pain, no fever or chills, no urinary symptoms.  Patient will be admitted for further assessment and management.  ED Course: On presentation to the ER, temperature was 99.2, heart rate ranged from 82 to 104 bpm, respiratory rate of 22, blood pressure of 107-180/62-106 mmHg, O2 sat of 100%.  Patient has been adequately hydrated, and started on insulin drip.  Pertinent labs: Repeat rapid COVID-19 test came back positive.  BMP done on presentation revealed sodium of 129, potassium of 4, chloride of 97, CO2 of 20, BUN of 39, serum creatinine of 2.03 (up from 1.48), blood sugar of 591.  CBC reveals WBC of 5.3, hemoglobin of 11.2, hematocrit of 35.2, MCV of 81.7, platelet count of 178.  EKG: Independently reviewed.   Imaging: independently reviewed.  CT scan of the head did not reveal any acute process.  It revealed atrophy and chronic microvascular ischemic chest X-ray did not reveal any  acute cardiopulmonary disease.  Review of Systems:  Negative for fever, visual changes, sore throat, rash, new muscle aches, chest pain, dysuria, bleeding.  Past Medical History:  Diagnosis Date  . A-fib ([name])   . Asthma    No PFTs, history of childhood asthma  . CAD (coronary artery disease)    a. 06/2013 STEMI/PCI ([facility]): LAD w/ thrombus (treated with BMS), mid 75%, D2 75%; LCX OM2 75%; RCA small, PDA 95%, PLV 95%;  b. 10/2013 Cath/PCI: ISR w/in LAD (Promus DES x 2), borderline OM2 lesion;  c. 01/2014 MV: Intermediate risk, medium-sized distal ant wall infarct w/ very small amt of peri-infarct ischemia. EF 60%.  . Cellulitis 04/2014   left facial  . Chondromalacia of medial femoral condyle    Left knee MRI 04/28/12: Chondromalacia of the medial femoral condyle with slight peripheral degeneration of the meniscocapsular junction of the medial meniscus; followed by sports medicine  . Collagen vascular disease ([place])   . Crack cocaine use    for 20+ years, has been enrolled in detox programs in the past  . Depression    with history of hospitalization for suicidal ideation  . Diabetes mellitus 2002   Diagnosed in 2002, started insulin in 2012  . Gout   . Gout 04/28/2012  . Headache(784.0)    CT head 08/2011: Periventricular and subcortical white matter hypodensities are most in keeping with chronic microangiopathic change  . HIV infection [facility]) Nov 2012   Followed by [name]  . Hyperlipidemia   . Hypertension   . Pulmonary embolism [facility])     Past Surgical History:  Procedure Laterality Date  .  BACK SURGERY     1988  . BOWEL RESECTION    . CARDIAC SURGERY    . CERVICAL SPINE SURGERY     " rods in my neck "  . CORONARY ARTERY BYPASS GRAFT    . CORONARY STENT PLACEMENT    . NM MYOCAR PERF WALL MOTION  12/27/2011   normal  . SPINE SURGERY       reports that he has never smoked. He has never used smokeless tobacco. He reports previous drug use. Drugs: "Crack" cocaine and Cocaine.  He reports that he does not drink alcohol.  No Known Allergies  Family History  Problem Relation Age of Onset  . Diabetes Mother   . Hypertension Mother   . Hyperlipidemia Mother   . Diabetes Father   . Cancer Father   . Hypertension Father   . Diabetes Brother   . Heart disease Brother   . Diabetes Sister   . Colon cancer Neg Hx      Prior to Admission medications   Medication Sig Start Date End Date Taking? Authorizing Provider  ACCU-CHEK SOFTCLIX LANCETS lancets USE TO CHECK BLOOD GLUCOSE LEVELS UP TO 4 TIMES DAILY AS DIRECTED 11/24/17   Lindell Spar I, NP  albuterol (PROAIR HFA) 108 (90 Base) MCG/ACT inhaler INHALE TWO PUFFS INTO THE LUNGS EVERY 6 (SIX) HOURS AS NEEDED FOR WHEEZING OR SHORTNESS OF BREATH. Patient taking differently: Inhale 2 puffs into the lungs every 6 (six) hours as needed for wheezing or shortness of breath.  11/24/17   Lindell Spar I, NP  allopurinol (ZYLOPRIM) 300 MG tablet Take 1 tablet (300 mg total) by mouth daily. For gout. 11/25/17   Lindell Spar I, NP  aspirin 81 MG chewable tablet Chew 81 mg by mouth daily.    [provider]  benzonatate (TESSALON) 100 MG capsule Take 2 capsules (200 mg total) by mouth 3 (three) times daily as needed for cough. 03/21/19   Ghimire, Henreitta Leber, MD  emtricitabine-rilpivir-tenofovir AF (ODEFSEY) 200-25-25 MG TABS tablet Take 1 tablet by mouth daily with breakfast. 03/04/19   Kuppelweiser, Cassie L, RPH-CPP  Fluticasone Propionate, Inhal, (FLOVENT DISKUS) 100 MCG/BLIST AEPB Inhale 1 puff into the lungs 2 (two) times daily.    [provider]  gabapentin (NEURONTIN) 400 MG capsule Take 1 capsule (400 mg total) by mouth 2 (two) times daily. For agitation/neuropathic pain 11/24/17   Lindell Spar I, NP  guaiFENesin (MUCINEX) 600 MG 12 hr tablet Take 1 tablet (600 mg total) by mouth 2 (two) times daily. Patient taking differently: Take 600 mg by mouth 2 (two) times daily as needed for cough or to loosen phlegm.   10/07/18   Lavina Hamman, MD  hydrOXYzine (ATARAX/VISTARIL) 25 MG tablet Take 1 tablet (25 mg total) by mouth 3 (three) times daily as needed for anxiety. 11/24/17   Lindell Spar I, NP  insulin glargine (LANTUS) 100 UNIT/ML injection Inject 0.3 mLs (30 Units total) into the skin at bedtime. For diabetes management 08/04/18   [name], MD  insulin lispro (HUMALOG) 100 UNIT/ML injection Inject 0.05 mLs (5 Units total) into the skin 3 (three) times daily with meals. For diabetes management Patient taking differently: Inject 4 Units into the skin See admin instructions. Inject 4 units into the skin three times a day with meals as needed if BGL is >130-140 11/24/17   Lindell Spar I, NP  Insulin Syringe-Needle U-100 (B-D INS SYR MICROFINE 1CC/27G) 27G X 5/8" 1 ML MISC 1 strip  by Does not apply route 3 (three) times daily as needed. For blood sugar checks 11/24/17   Lindell Spar I, NP  metFORMIN (GLUCOPHAGE) 1000 MG tablet Take 1,000 mg by mouth 2 (two) times daily. 03/04/19   [provider]  sertraline (ZOLOFT) 50 MG tablet Take 1 tablet (50 mg total) by mouth daily. 09/22/18   Lady Deutscher, MD  traZODone (DESYREL) 50 MG tablet Take 1 tablet (50 mg total) by mouth at bedtime. Patient taking differently: Take 50 mg by mouth at bedtime as needed for sleep.  09/21/18   Lady Deutscher, MD    Physical Exam: Vitals:   04/11/19 1800 04/11/19 1830 04/11/19 1834 04/11/19 1900  BP: 139/84 (!) 180/106 (!) 180/106 (!) 151/96  Pulse:   82   Resp:   (!) 22   Temp:      TempSrc:      SpO2: 99% 100% 100% 100%  Weight:      Height:        Constitutional:  . Appears calm and comfortable Eyes:  Marland Kitchen Mild pallor. No jaundice.  ENMT:  . external ears, nose appear normal Neck:  . Neck is supple. No JVD Respiratory:  . Decreased air entry  Cardiovascular:  . S1S2 . No LE extremity edema   Abdomen:  . Abdomen is soft and non tender. Organs are difficult to assess. Neurologic:   . Awake and alert. . Moves all limbs.  Wt Readings from Last 3 Encounters:  04/11/19 74.4 kg  03/19/19 62.6 kg  12/16/18 74.4 kg    I have personally reviewed following labs and imaging studies  Labs on Admission:  CBC: Recent Labs  Lab 04/11/19 1538  WBC 5.3  HGB 11.2*  HCT 35.2*  MCV 81.7  PLT 734   Basic Metabolic Panel: Recent Labs  Lab 04/11/19 1538  NA 129*  K 4.0  CL 97*  CO2 20*  GLUCOSE 591*  BUN 39*  CREATININE 2.03*  CALCIUM 7.5*   Liver Function Tests: Recent Labs  Lab 04/11/19 1538  AST 15  ALT 11  ALKPHOS 72  BILITOT 0.7  PROT 7.0  ALBUMIN 3.2*   No results for input(s): LIPASE, AMYLASE in the last 168 hours. No results for input(s): AMMONIA in the last 168 hours. Coagulation Profile: No results for input(s): INR, PROTIME in the last 168 hours. Cardiac Enzymes: No results for input(s): CKTOTAL, CKMB, CKMBINDEX, TROPONINI in the last 168 hours. BNP (last 3 results) No results for input(s): PROBNP in the last 8760 hours. HbA1C: No results for input(s): HGBA1C in the last 72 hours. CBG: Recent Labs  Lab 04/11/19 1938  GLUCAP 348*   Lipid Profile: No results for input(s): CHOL, HDL, LDLCALC, TRIG, CHOLHDL, LDLDIRECT in the last 72 hours. Thyroid Function Tests: No results for input(s): TSH, T4TOTAL, FREET4, T3FREE, THYROIDAB in the last 72 hours. Anemia Panel: No results for input(s): VITAMINB12, FOLATE, FERRITIN, TIBC, IRON, RETICCTPCT in the last 72 hours. Urine analysis:    Component Value Date/Time   COLORURINE STRAW (A) 03/19/2019 1446   APPEARANCEUR CLEAR 03/19/2019 1446   LABSPEC 1.022 03/19/2019 1446   PHURINE 6.0 03/19/2019 1446   GLUCOSEU >=500 (A) 03/19/2019 1446   HGBUR SMALL (A) 03/19/2019 1446   BILIRUBINUR NEGATIVE 03/19/2019 1446   KETONESUR NEGATIVE 03/19/2019 1446   PROTEINUR 100 (A) 03/19/2019 1446   UROBILINOGEN 0.2 08/13/2015 1030   NITRITE NEGATIVE 03/19/2019 1446   LEUKOCYTESUR NEGATIVE 03/19/2019  1446   Sepsis Labs: @LABRCNTIP (procalcitonin:4,lacticidven:4) )  Recent Results (from the past 240 hour(s))  SARS Coronavirus 2 (CEPHEID- Performed in [place] hospital lab), Hosp Order     Status: Abnormal   Collection Time: 04/11/19  3:40 PM   Specimen: Nasopharyngeal Swab  Result Value Ref Range Status   SARS Coronavirus 2 POSITIVE (A) NEGATIVE Final    Comment: RESULT CALLED TO, READ BACK BY AND VERIFIED WITH: SIMPSON AT 1756ON 04/11/2019 BY JPM (NOTE) If result is NEGATIVE SARS-CoV-2 target nucleic acids are NOT DETECTED. The SARS-CoV-2 RNA is generally detectable in upper and lower  respiratory specimens during the acute phase of infection. The lowest  concentration of SARS-CoV-2 viral copies this assay can detect is 250  copies / mL. A negative result does not preclude SARS-CoV-2 infection  and should not be used as the sole basis for treatment or other  patient management decisions.  A negative result may occur with  improper specimen collection / handling, submission of specimen other  than nasopharyngeal swab, presence of viral mutation(s) within the  areas targeted by this assay, and inadequate number of viral copies  (<250 copies / mL). A negative result must be combined with clinical  observations, patient history, and epidemiological information. If result is POSITIVE SARS-CoV-2 target nucleic acids are DETECTED. The  SARS-CoV-2 RNA is generally detectable in upper and lower  respiratory specimens during the acute phase of infection.  Positive  results are indicative of active infection with SARS-CoV-2.  Clinical  correlation with patient history and other diagnostic information is  necessary to determine patient infection status.  Positive results do  not rule out bacterial infection or co-infection with other viruses. If result is PRESUMPTIVE POSTIVE SARS-CoV-2 nucleic acids MAY BE PRESENT.   A presumptive positive result was obtained on the submitted specimen   and confirmed on repeat testing.  While 2019 novel coronavirus  (SARS-CoV-2) nucleic acids may be present in the submitted sample  additional confirmatory testing may be necessary for epidemiological  and / or clinical management purposes  to differentiate between  SARS-CoV-2 and other Sarbecovirus currently known to infect humans.  If clinically indicated additional testing with an alternate test  methodology [phone]) is a dvised. The SARS-CoV-2 RNA is generally  detectable in upper and lower respiratory specimens during the acute  phase of infection. The expected result is Negative. Fact Sheet for Patients:  StrictlyIdeas.no Fact Sheet for Healthcare Providers: BankingDealers.co.za This test is not yet approved or cleared by the [place] FDA and has been authorized for detection and/or diagnosis of SARS-CoV-2 by FDA under an Emergency Use Authorization (EUA).  This EUA will remain in effect (meaning this test can be used) for the duration of the COVID-19 declaration under Section 564(b)(1) of the Act, 21 U.S.C. section 360bbb-3(b)(1), unless the authorization is terminated or revoked sooner. Performed at [facility], [place] [address] [place], [place] [address]       Radiological Exams on Admission: Ct Head Wo Contrast  Result Date: 04/11/2019 CLINICAL DATA:  Patient with headache for 3-4 days. Recently recovered from [place]. EXAM: CT HEAD WITHOUT CONTRAST TECHNIQUE: Contiguous axial images were obtained from the base of the skull through the vertex without intravenous contrast. COMPARISON:  Brain CT 09/02/2018 FINDINGS: Brain: Ventricles and sulci are prominent compatible with atrophy. Periventricular and subcortical white matter hypodensity compatible with chronic microvascular ischemic changes. No evidence for acute cortically based infarct, intracranial hemorrhage, mass lesion or mass-effect. Vascular:  Unremarkable Skull: Intact. Sinuses/Orbits: Paranasal sinuses are well aerated. Mastoid air  cells are unremarkable. Orbits are unremarkable. Other: None. IMPRESSION: No acute intracranial process. Atrophy and chronic microvascular ischemic changes. Electronically Signed   By: Lovey Newcomer M.D.   On: 04/11/2019 16:40   Dg Chest Portable 1 View  Result Date: 04/11/2019 CLINICAL DATA:  Shortness of breath. EXAM: PORTABLE CHEST 1 VIEW COMPARISON:  Chest radiograph 03/19/2019 FINDINGS: Monitoring leads overlie the patient. Patient status post median sternotomy. Normal cardiac and mediastinal contours. No consolidative pulmonary opacities. No pleural effusion or pneumothorax. IMPRESSION: No acute cardiopulmonary process. Electronically Signed   By: Lovey Newcomer M.D.   On: 04/11/2019 15:54    EKG: Independently reviewed.   Active Problems:   * No active hospital problems. *   Assessment/Plan Pneumonia secondary to severe acute respiratory syndrome secondary to COVID-19: Admit patient to [place] [facility] inflammatory markers daily Supportive care No steroids for now considering imminent DKA Further management will depend on hospital course.  Acute kidney injury on chronic kidney disease: Secondary to volume depletion. Hydrate patient, but cautiously considering current COVID status. Further management depend on hospital course.  Nausea, vomiting and diarrhea: Possibly related to COVID. Send stool for further analysis Supportive care. Adequate hydration.  Elevated blood sugar/imminent DKA: Hydration as above Insulin Further management depend on hospital course  HIV: Continue current home medications.  DVT prophylaxis: Subacute Lovenox Code Status: Full code Family Communication:  Disposition Plan: Home eventually Consults called: None Admission status: Inpatient  Time spent: 65 minutes  Dana Allan, MD  [facility] Pager #: [phone] 7PM-7AM contact night coverage as above  04/11/2019, 8:02 PM

## 2019-04-11 NOTE — ED Provider Notes (Signed)
[facility] DEPT Provider Note   CSN: [id number] Arrival date & time: 04/11/19  1419    History   Chief Complaint Chief Complaint  Patient presents with  . Shortness of Breath    HPI Michael [name] is a 68 y.o. male.     HPI Patient presents to the ED for evaluation of weakness and fatigue.  Patient states he was recently and the hospital for COVID-19 illness.  This was back in June and he was released about 2 weeks ago.  Patient states he had been doing okay until the last 3 or 4 days.  Patient has started feeling weak.  He has had pain in his chest.  He feels short of breath.  He feels lightheaded and dizzy.  He says he feels still somewhat short of breath.  Has not been coughing a lot denies any recent fevers.  This morning he did also started to have some vomiting and diarrhea.  He denies any focal numbness or weakness but does feel weak all over. Past Medical History:  Diagnosis Date  . A-fib ([place])   . Asthma    No PFTs, history of childhood asthma  . CAD (coronary artery disease)    a. 06/2013 STEMI/PCI ([facility]): LAD w/ thrombus (treated with BMS), mid 75%, D2 75%; LCX OM2 75%; RCA small, PDA 95%, PLV 95%;  b. 10/2013 Cath/PCI: ISR w/in LAD (Promus DES x 2), borderline OM2 lesion;  c. 01/2014 MV: Intermediate risk, medium-sized distal ant wall infarct w/ very small amt of peri-infarct ischemia. EF 60%.  . Cellulitis 04/2014   left facial  . Chondromalacia of medial femoral condyle    Left knee MRI 04/28/12: Chondromalacia of the medial femoral condyle with slight peripheral degeneration of the meniscocapsular junction of the medial meniscus; followed by sports medicine  . Collagen vascular disease ([place])   . Crack cocaine use    for 20+ years, has been enrolled in detox programs in the past  . Depression    with history of hospitalization for suicidal ideation  . Diabetes mellitus 2002   Diagnosed in 2002, started insulin in 2012  . Gout   .  Gout 04/28/2012  . Headache(784.0)    CT head 08/2011: Periventricular and subcortical white matter hypodensities are most in keeping with chronic microangiopathic change  . HIV infection [facility]) Nov 2012   Followed by [name]  . Hyperlipidemia   . Hypertension   . Pulmonary embolism [facility])     Patient Active Problem List   Diagnosis Date Noted  . COVID-19 virus infection 03/19/2019  . Chest pain 10/03/2018  . Malnutrition of moderate degree 09/21/2018  . MDD (major depressive disorder), recurrent episode, moderate ([place])   . Type II diabetes mellitus with renal manifestations ([place]) 05/04/2018  . GERD (gastroesophageal reflux disease) 05/04/2018  . S/P CABG (coronary artery bypass graft)   . Left testicular pain   . Depression 02/20/2018  . Epididymo-orchitis, acute 02/20/2018  . Essential hypertension 02/20/2018  . Precordial chest pain 02/20/2018  . AKI (acute kidney injury) ([place]) 02/14/2018  . HCAP (healthcare-associated pneumonia) 02/14/2018  . History of pulmonary embolism 07/04/2017  . Acute hyponatremia 05/11/2017  . Hyperglycemia due to type 2 diabetes mellitus ([place]) 05/11/2017  . CHF (congestive heart failure) ([place]) 04/01/2017  . Hepatitis C 12/30/2016  . Chronic ischemic heart disease 11/12/2016  . Paroxysmal A-fib ([place]) 11/12/2016  . Back pain 04/18/2016  . S/P carotid endarterectomy 11/15/2015  . MDD (major depressive disorder),  recurrent severe, without psychosis ([place]) 09/09/2015  . Cocaine-induced mood disorder ([place]) 08/14/2015  . Cocaine abuse with cocaine-induced mood disorder ([place]) 08/14/2015  . Gout 07/10/2015  . Acute renal failure superimposed on stage 3 chronic kidney disease ([place]) 03/06/2015  . Normocytic anemia 03/06/2015  . Chronic kidney disease, stage III (moderate) ([place]) 03/06/2015  . Hypoglycemia   . Encounter for general adult medical examination with abnormal findings 02/09/2015  . Cocaine use disorder, severe, dependence ([place]) 12/13/2014  .  Substance or medication-induced depressive disorder with onset during withdrawal ([place]) 12/13/2014  . Severe recurrent major depressive disorder with psychotic features ([place]) 12/12/2014  . Cervicalgia 06/28/2014  . Lumbar radiculopathy, chronic 06/28/2014  . Asthma, chronic 02/03/2014  . 3-vessel CAD 06/24/2013  . ED (erectile dysfunction) of organic origin 07/07/2012  . Hypertension goal BP (blood pressure) < 140/80 04/29/2012  . Chondromalacia of left knee 03/19/2012  . Hyperlipidemia with target LDL less than 100 02/12/2012  . Fibromyalgia 02/12/2012  . HIV (human immunodeficiency virus infection) ([place]) 08/27/2011  . Uncontrolled type 2 diabetes with neuropathy ([place]) 10/17/2000    Past Surgical History:  Procedure Laterality Date  . BACK SURGERY     1988  . BOWEL RESECTION    . CARDIAC SURGERY    . CERVICAL SPINE SURGERY     " rods in my neck "  . CORONARY ARTERY BYPASS GRAFT    . CORONARY STENT PLACEMENT    . NM MYOCAR PERF WALL MOTION  12/27/2011   normal  . SPINE SURGERY          Home Medications    Prior to Admission medications   Medication Sig Start Date End Date Taking? Authorizing Provider  ACCU-CHEK SOFTCLIX LANCETS lancets USE TO CHECK BLOOD GLUCOSE LEVELS UP TO 4 TIMES DAILY AS DIRECTED 11/24/17   Lindell Spar I, NP  albuterol (PROAIR HFA) 108 (90 Base) MCG/ACT inhaler INHALE TWO PUFFS INTO THE LUNGS EVERY 6 (SIX) HOURS AS NEEDED FOR WHEEZING OR SHORTNESS OF BREATH. Patient taking differently: Inhale 2 puffs into the lungs every 6 (six) hours as needed for wheezing or shortness of breath.  11/24/17   Lindell Spar I, NP  allopurinol (ZYLOPRIM) 300 MG tablet Take 1 tablet (300 mg total) by mouth daily. For gout. 11/25/17   Lindell Spar I, NP  aspirin 81 MG chewable tablet Chew 81 mg by mouth daily.    [provider]  benzonatate (TESSALON) 100 MG capsule Take 2 capsules (200 mg total) by mouth 3 (three) times daily as needed for cough. 03/21/19   Ghimire,  Henreitta Leber, MD  emtricitabine-rilpivir-tenofovir AF (ODEFSEY) 200-25-25 MG TABS tablet Take 1 tablet by mouth daily with breakfast. 03/04/19   Kuppelweiser, Cassie L, RPH-CPP  Fluticasone Propionate, Inhal, (FLOVENT DISKUS) 100 MCG/BLIST AEPB Inhale 1 puff into the lungs 2 (two) times daily.    [provider]  gabapentin (NEURONTIN) 400 MG capsule Take 1 capsule (400 mg total) by mouth 2 (two) times daily. For agitation/neuropathic pain 11/24/17   Lindell Spar I, NP  guaiFENesin (MUCINEX) 600 MG 12 hr tablet Take 1 tablet (600 mg total) by mouth 2 (two) times daily. Patient taking differently: Take 600 mg by mouth 2 (two) times daily as needed for cough or to loosen phlegm.  10/07/18   Lavina Hamman, MD  hydrOXYzine (ATARAX/VISTARIL) 25 MG tablet Take 1 tablet (25 mg total) by mouth 3 (three) times daily as needed for anxiety. 11/24/17   Encarnacion Slates, NP  insulin  glargine (LANTUS) 100 UNIT/ML injection Inject 0.3 mLs (30 Units total) into the skin at bedtime. For diabetes management 08/04/18   [name], MD  insulin lispro (HUMALOG) 100 UNIT/ML injection Inject 0.05 mLs (5 Units total) into the skin 3 (three) times daily with meals. For diabetes management Patient taking differently: Inject 4 Units into the skin See admin instructions. Inject 4 units into the skin three times a day with meals as needed if BGL is >130-140 11/24/17   Lindell Spar I, NP  Insulin Syringe-Needle U-100 (B-D INS SYR MICROFINE 1CC/27G) 27G X 5/8" 1 ML MISC 1 strip by Does not apply route 3 (three) times daily as needed. For blood sugar checks 11/24/17   Lindell Spar I, NP  metFORMIN (GLUCOPHAGE) 1000 MG tablet Take 1,000 mg by mouth 2 (two) times daily. 03/04/19   [provider]  sertraline (ZOLOFT) 50 MG tablet Take 1 tablet (50 mg total) by mouth daily. 09/22/18   Lady Deutscher, MD  traZODone (DESYREL) 50 MG tablet Take 1 tablet (50 mg total) by mouth at bedtime. Patient taking differently: Take  50 mg by mouth at bedtime as needed for sleep.  09/21/18   Lady Deutscher, MD    Family History Family History  Problem Relation Age of Onset  . Diabetes Mother   . Hypertension Mother   . Hyperlipidemia Mother   . Diabetes Father   . Cancer Father   . Hypertension Father   . Diabetes Brother   . Heart disease Brother   . Diabetes Sister   . Colon cancer Neg Hx     Social History Social History   Tobacco Use  . Smoking status: Never Smoker  . Smokeless tobacco: Never Used  Substance Use Topics  . Alcohol use: No    Alcohol/week: 4.0 standard drinks    Types: 2 Cans of beer, 2 Shots of liquor per week  . Drug use: Not Currently    Types: "Crack" cocaine, Cocaine    Comment: last used sometime in April     Allergies   Patient has no known allergies.   Review of Systems Review of Systems  All other systems reviewed and are negative.    Physical Exam Updated Vital Signs BP (!) 123/93   Pulse 98   Temp 99.2 F (37.3 C) (Oral)   Resp (!) 22   Ht 1.702 m (5\' 7" )   Wt 74.4 kg   SpO2 99%   BMI 25.69 kg/m   Physical Exam Vitals signs and nursing note reviewed.  Constitutional:      Appearance: He is not ill-appearing or diaphoretic.  HENT:     Head: Normocephalic and atraumatic.     Right Ear: External ear normal.     Left Ear: External ear normal.  Eyes:     General: No scleral icterus.       Right eye: No discharge.        Left eye: No discharge.     Conjunctiva/sclera: Conjunctivae normal.  Neck:     Musculoskeletal: Neck supple.     Trachea: No tracheal deviation.  Cardiovascular:     Rate and Rhythm: Normal rate and regular rhythm.  Pulmonary:     Effort: Pulmonary effort is normal. No respiratory distress.     Breath sounds: Normal breath sounds. No stridor. No wheezing or rales.  Abdominal:     General: Bowel sounds are normal. There is no distension.     Palpations: Abdomen is soft.  Tenderness: There is no abdominal tenderness.  There is no guarding or rebound.  Musculoskeletal:        General: No tenderness.  Skin:    General: Skin is warm and dry.     Findings: No rash.  Neurological:     General: No focal deficit present.     Mental Status: He is alert and oriented to person, place, and time.     Cranial Nerves: No cranial nerve deficit (no facial droop, extraocular movements intact, no slurred speech).     Sensory: No sensory deficit.     Motor: No abnormal muscle tone or seizure activity.     Coordination: Coordination normal.     Comments: Generalized weakness, movements are slow      ED Treatments / Results  Labs (all labs ordered are listed, but only abnormal results are displayed) Labs Reviewed  SARS CORONAVIRUS 2 (HOSPITAL ORDER, [facility]) - Abnormal; Notable for the following components:      Result Value   SARS Coronavirus 2 POSITIVE (*)    All other components within normal limits  CBC - Abnormal; Notable for the following components:   Hemoglobin 11.2 (*)    HCT 35.2 (*)    All other components within normal limits  COMPREHENSIVE METABOLIC PANEL - Abnormal; Notable for the following components:   Sodium 129 (*)    Chloride 97 (*)    CO2 20 (*)    Glucose, Bld 591 (*)    BUN 39 (*)    Creatinine, Ser 2.03 (*)    Calcium 7.5 (*)    Albumin 3.2 (*)    GFR calc non Af Amer 33 (*)    GFR calc Af Amer 38 (*)    All other components within normal limits  BRAIN NATRIURETIC PEPTIDE  TROPONIN I (HIGH SENSITIVITY)  TROPONIN I (HIGH SENSITIVITY)    EKG EKG Interpretation  Date/Time:  Sunday April 11 2019 14:36:43 EDT Ventricular Rate:  102 PR Interval:    QRS Duration: 91 QT Interval:  338 QTC Calculation: 441 R Axis:   79 Text Interpretation:  Sinus tachycardia Atrial premature complexes Borderline repolarization abnormality Since last tracing rate faster Confirmed by [name] [phone]) on 04/11/2019 3:39:47 PM   Radiology Ct Head Wo Contrast  Result  Date: 04/11/2019 CLINICAL DATA:  Patient with headache for 3-4 days. Recently recovered from [place]. EXAM: CT HEAD WITHOUT CONTRAST TECHNIQUE: Contiguous axial images were obtained from the base of the skull through the vertex without intravenous contrast. COMPARISON:  Brain CT 09/02/2018 FINDINGS: Brain: Ventricles and sulci are prominent compatible with atrophy. Periventricular and subcortical white matter hypodensity compatible with chronic microvascular ischemic changes. No evidence for acute cortically based infarct, intracranial hemorrhage, mass lesion or mass-effect. Vascular: Unremarkable Skull: Intact. Sinuses/Orbits: Paranasal sinuses are well aerated. Mastoid air cells are unremarkable. Orbits are unremarkable. Other: None. IMPRESSION: No acute intracranial process. Atrophy and chronic microvascular ischemic changes. Electronically Signed   By: Lovey Newcomer M.D.   On: 04/11/2019 16:40   Dg Chest Portable 1 View  Result Date: 04/11/2019 CLINICAL DATA:  Shortness of breath. EXAM: PORTABLE CHEST 1 VIEW COMPARISON:  Chest radiograph 03/19/2019 FINDINGS: Monitoring leads overlie the patient. Patient status post median sternotomy. Normal cardiac and mediastinal contours. No consolidative pulmonary opacities. No pleural effusion or pneumothorax. IMPRESSION: No acute cardiopulmonary process. Electronically Signed   By: Lovey Newcomer M.D.   On: 04/11/2019 15:54    Procedures .Critical Care Performed by: Tomi Bamberger,  Wille Glaser, MD Authorized by: [name], MD   Critical care provider statement:    Critical care time (minutes):  45   Critical care was time spent personally by me on the following activities:  Discussions with consultants, evaluation of patient's response to treatment, examination of patient, ordering and performing treatments and interventions, ordering and review of laboratory studies, ordering and review of radiographic studies, pulse oximetry, re-evaluation of patient's condition, obtaining history  from patient or surrogate and review of old charts   (including critical care time)  Medications Ordered in ED Medications  ondansetron (ZOFRAN) injection 4 mg (4 mg Intravenous Not Given 04/11/19 1823)  sodium chloride 0.9 % bolus 1,000 mL (has no administration in time range)    Followed by  0.9 %  sodium chloride infusion (has no administration in time range)  insulin regular, human (MYXREDLIN) 100 units/ 100 mL infusion (has no administration in time range)     Initial Impression / Assessment and Plan / ED Course  I have reviewed the triage vital signs and the nursing notes.  Pertinent labs & imaging results that were available during my care of the patient were reviewed by me and considered in my medical decision making (see chart for details).  Clinical Course as of Apr 10 1830  Sun Apr 11, 2019  1656 Hyper glycemia and acute kidney injury noted.  Hyponatremia and hypocalcemia and hypoalbuminemia also noted   [JK]  1756 IV fluids and insulin infusion ordered.   [JK]    Clinical Course User Index [JK] [name], MD     Patient's ED work-up is notable for acute kidney injury associated with hyperglycemia.  CT scan without acute findings and chest x-ray without signs of pneumonia fortunately.  Patient remains covid positive.  Patient has acute kidney injury likely related from prerenal azotemia and his hyperglycemia.  Patient was gently hydrated recognizing the concern of overhydration with his known covid illness.  Plan on admission to the hospital for further treatment and stabilization.  Final Clinical Impressions(s) / ED Diagnoses   Final diagnoses:  AKI (acute kidney injury) ([place])  Hyperglycemia  COVID-19     [name], MD 04/11/19 [phone]

## 2019-04-11 NOTE — ED Notes (Signed)
Provider informed of critical CBG 591

## 2019-04-11 NOTE — ED Notes (Signed)
[facility] called for transport. 

## 2019-04-11 NOTE — ED Notes (Signed)
Bed: QI34 Expected date:  Expected time:  Means of arrival:  Comments: Neg Pressure

## 2019-04-11 NOTE — ED Notes (Signed)
Verbal order from Dr. Marthenia Rolling to D/C insulin drip.  Pt to have q1 hour CBG checks x 4 and then pt will be q4hour CBG checks.

## 2019-04-12 ENCOUNTER — Inpatient Hospital Stay (HOSPITAL_COMMUNITY): Payer: Medicare Other

## 2019-04-12 DIAGNOSIS — R0603 Acute respiratory distress: Secondary | ICD-10-CM

## 2019-04-12 LAB — MAGNESIUM
Magnesium: 1.9 mg/dL (ref 1.7–2.4)
Magnesium: 1.9 mg/dL (ref 1.7–2.4)
Magnesium: 1.9 mg/dL (ref 1.7–2.4)

## 2019-04-12 LAB — BASIC METABOLIC PANEL
Anion gap: 13 (ref 5–15)
Anion gap: 6 (ref 5–15)
Anion gap: 9 (ref 5–15)
Anion gap: 8 (ref 5–15)
BUN: 33 mg/dL — ABNORMAL HIGH (ref 8–23)
BUN: 31 mg/dL — ABNORMAL HIGH (ref 8–23)
BUN: 28 mg/dL — ABNORMAL HIGH (ref 8–23)
BUN: 26 mg/dL — ABNORMAL HIGH (ref 8–23)
CO2: 22 mmol/L (ref 22–32)
CO2: 24 mmol/L (ref 22–32)
CO2: 25 mmol/L (ref 22–32)
CO2: 26 mmol/L (ref 22–32)
Calcium: 8.4 mg/dL — ABNORMAL LOW (ref 8.9–10.3)
Calcium: 7.8 mg/dL — ABNORMAL LOW (ref 8.9–10.3)
Calcium: 8.1 mg/dL — ABNORMAL LOW (ref 8.9–10.3)
Calcium: 8.1 mg/dL — ABNORMAL LOW (ref 8.9–10.3)
Chloride: 100 mmol/L (ref 98–111)
Chloride: 104 mmol/L (ref 98–111)
Chloride: 105 mmol/L (ref 98–111)
Chloride: 105 mmol/L (ref 98–111)
Creatinine, Ser: 1.92 mg/dL — ABNORMAL HIGH (ref 0.61–1.24)
Creatinine, Ser: 1.78 mg/dL — ABNORMAL HIGH (ref 0.61–1.24)
Creatinine, Ser: 1.69 mg/dL — ABNORMAL HIGH (ref 0.61–1.24)
Creatinine, Ser: 1.64 mg/dL — ABNORMAL HIGH (ref 0.61–1.24)
GFR calc Af Amer: 41 mL/min — ABNORMAL LOW (ref >60)
GFR calc Af Amer: 44 mL/min — ABNORMAL LOW (ref >60)
GFR calc Af Amer: 47 mL/min — ABNORMAL LOW (ref >60)
GFR calc Af Amer: 49 mL/min — ABNORMAL LOW (ref >60)
GFR calc non Af Amer: 35 mL/min — ABNORMAL LOW (ref >60)
GFR calc non Af Amer: 38 mL/min — ABNORMAL LOW (ref >60)
GFR calc non Af Amer: 41 mL/min — ABNORMAL LOW (ref >60)
GFR calc non Af Amer: 42 mL/min — ABNORMAL LOW (ref >60)
Glucose, Bld: 321 mg/dL — ABNORMAL HIGH (ref 70–99)
Glucose, Bld: 362 mg/dL — ABNORMAL HIGH (ref 70–99)
Glucose, Bld: 204 mg/dL — ABNORMAL HIGH (ref 70–99)
Glucose, Bld: 220 mg/dL — ABNORMAL HIGH (ref 70–99)
Potassium: 3.9 mmol/L (ref 3.5–5.1)
Potassium: 3.9 mmol/L (ref 3.5–5.1)
Potassium: 3.7 mmol/L (ref 3.5–5.1)
Potassium: 4 mmol/L (ref 3.5–5.1)
Sodium: 135 mmol/L (ref 135–145)
Sodium: 134 mmol/L — ABNORMAL LOW (ref 135–145)
Sodium: 139 mmol/L (ref 135–145)
Sodium: 139 mmol/L (ref 135–145)

## 2019-04-12 LAB — C-REACTIVE PROTEIN: CRP: 1.1 mg/dL — ABNORMAL HIGH (ref <1.0)

## 2019-04-12 LAB — GASTROINTESTINAL PANEL BY PCR, STOOL (REPLACES STOOL CULTURE)

## 2019-04-12 LAB — CBC
HCT: 34.2 % — ABNORMAL LOW (ref 39.0–52.0)
Hemoglobin: 11.2 g/dL — ABNORMAL LOW (ref 13.0–17.0)
MCH: 26.1 pg (ref 26.0–34.0)
MCHC: 32.7 g/dL (ref 30.0–36.0)
MCV: 79.7 fL — ABNORMAL LOW (ref 80.0–100.0)
Platelets: 269 10*3/uL (ref 150–400)
RBC: 4.29 MIL/uL (ref 4.22–5.81)
RDW: 13.3 % (ref 11.5–15.5)
WBC: 6.3 10*3/uL (ref 4.0–10.5)
nRBC: 0 % (ref 0.0–0.2)

## 2019-04-12 LAB — GLUCOSE, CAPILLARY
Glucose-Capillary: 240 mg/dL — ABNORMAL HIGH (ref 70–99)
Glucose-Capillary: 346 mg/dL — ABNORMAL HIGH (ref 70–99)
Glucose-Capillary: 200 mg/dL — ABNORMAL HIGH (ref 70–99)
Glucose-Capillary: 272 mg/dL — ABNORMAL HIGH (ref 70–99)
Glucose-Capillary: 167 mg/dL — ABNORMAL HIGH (ref 70–99)
Glucose-Capillary: 194 mg/dL — ABNORMAL HIGH (ref 70–99)

## 2019-04-12 LAB — C DIFFICILE QUICK SCREEN W PCR REFLEX
C Diff antigen: NEGATIVE
C Diff interpretation: NOT DETECTED
C Diff toxin: NEGATIVE

## 2019-04-12 LAB — PHOSPHORUS
Phosphorus: 2.4 mg/dL — ABNORMAL LOW (ref 2.5–4.6)
Phosphorus: 2.7 mg/dL (ref 2.5–4.6)
Phosphorus: 2.5 mg/dL (ref 2.5–4.6)

## 2019-04-12 LAB — D-DIMER, QUANTITATIVE: D-Dimer, Quant: 2.41 ug/mL-FEU — ABNORMAL HIGH (ref 0.00–0.50)

## 2019-04-12 LAB — FERRITIN: Ferritin: 125 ng/mL (ref 24–336)

## 2019-04-12 LAB — ABO/RH: ABO/RH(D): O POS

## 2019-04-12 MED ORDER — IOHEXOL 350 MG/ML SOLN
80.0000 mL | Freq: Once | INTRAVENOUS | Status: AC | PRN
  Administered 2019-04-12: 80 mL via INTRAVENOUS

## 2019-04-12 MED ORDER — ASPIRIN 81 MG PO CHEW
81.0000 mg | CHEWABLE_TABLET | Freq: Every day | ORAL | Status: DC
  Administered 2019-04-12 – 2019-04-21 (×10): 81 mg via ORAL
  Filled 2019-04-12 (×10): qty 1

## 2019-04-12 MED ORDER — IPRATROPIUM-ALBUTEROL 20-100 MCG/ACT IN AERS: 1.0000 | INHALATION_SPRAY | Freq: Four times a day (QID) | RESPIRATORY_TRACT | Status: DC | PRN

## 2019-04-12 MED ORDER — IPRATROPIUM-ALBUTEROL 20-100 MCG/ACT IN AERS
1.0000 | INHALATION_SPRAY | Freq: Three times a day (TID) | RESPIRATORY_TRACT | Status: DC
  Filled 2019-04-12: qty 4

## 2019-04-12 MED ORDER — INSULIN ASPART 100 UNIT/ML ~~LOC~~ SOLN
0.0000 [IU] | SUBCUTANEOUS | Status: DC
  Administered 2019-04-12 (×2): 2 [IU] via SUBCUTANEOUS
  Administered 2019-04-12: 7 [IU] via SUBCUTANEOUS
  Administered 2019-04-12: 5 [IU] via SUBCUTANEOUS
  Administered 2019-04-12 (×2): 3 [IU] via SUBCUTANEOUS

## 2019-04-12 MED ORDER — FLUTICASONE PROPIONATE HFA 110 MCG/ACT IN AERO
1.0000 | INHALATION_SPRAY | Freq: Two times a day (BID) | RESPIRATORY_TRACT | Status: DC
  Administered 2019-04-12 – 2019-04-21 (×18): 1 via RESPIRATORY_TRACT
  Filled 2019-04-12 (×2): qty 12

## 2019-04-12 MED ORDER — INSULIN GLARGINE 100 UNIT/ML ~~LOC~~ SOLN: 30.0000 [IU] | Freq: Every day | SUBCUTANEOUS | Status: DC

## 2019-04-12 MED ORDER — SODIUM CHLORIDE 0.9 % IV SOLN
INTRAVENOUS | Status: AC
  Administered 2019-04-12: 04:00:00 via INTRAVENOUS

## 2019-04-12 MED ORDER — HEPARIN SODIUM (PORCINE) 5000 UNIT/ML IJ SOLN
5000.0000 [IU] | Freq: Three times a day (TID) | INTRAMUSCULAR | Status: DC
  Administered 2019-04-12 – 2019-04-21 (×27): 5000 [IU] via SUBCUTANEOUS
  Filled 2019-04-12 (×25): qty 1

## 2019-04-12 MED ORDER — FLUTICASONE PROPIONATE (INHAL) 100 MCG/BLIST IN AEPB: 1.0000 | INHALATION_SPRAY | Freq: Two times a day (BID) | RESPIRATORY_TRACT | Status: DC

## 2019-04-12 MED ORDER — EMTRICITAB-RILPIVIR-TENOFOV AF 200-25-25 MG PO TABS
1.0000 | ORAL_TABLET | Freq: Every day | ORAL | Status: DC
  Administered 2019-04-13 – 2019-04-21 (×9): 1 via ORAL
  Filled 2019-04-12 (×10): qty 1

## 2019-04-12 MED ORDER — IPRATROPIUM-ALBUTEROL 0.5-2.5 (3) MG/3ML IN SOLN
3.0000 mL | Freq: Four times a day (QID) | RESPIRATORY_TRACT | Status: DC
  Administered 2019-04-12: 3 mL via RESPIRATORY_TRACT
  Filled 2019-04-12: qty 3

## 2019-04-12 MED ORDER — IPRATROPIUM-ALBUTEROL 0.5-2.5 (3) MG/3ML IN SOLN: 3.0000 mL | Freq: Four times a day (QID) | RESPIRATORY_TRACT | Status: DC | PRN

## 2019-04-12 MED ORDER — INSULIN GLARGINE 100 UNIT/ML ~~LOC~~ SOLN
30.0000 [IU] | Freq: Every day | SUBCUTANEOUS | Status: DC
  Administered 2019-04-12 – 2019-04-14 (×3): 30 [IU] via SUBCUTANEOUS
  Filled 2019-04-12 (×3): qty 0.3

## 2019-04-12 NOTE — Progress Notes (Signed)
Talked to sister this AM and updated

## 2019-04-12 NOTE — Discharge Summary (Addendum)
Physician Discharge Summary  Michael [name] UXN:[id number] DOB: Jul 11, 1951 DOA: 04/11/2019  PCP: [name], PA-C  Admit date: 04/11/2019 Discharge date: 04/12/2019  Admitted From: Rehab center Disposition: Rehab center  Recommendations for Outpatient Follow-up:  1. Follow up with PCP in 1-2 weeks 2. Please obtain BMP/CBC in one week  Discharge Condition: Stable CODE STATUS: Full Diet recommendation: Diabetic  **Patient remained overnight due to social issues - essentially homeless no longer able to go to previous rehab/detox facility; family (where he was discharged to last time) is unavailable - CM/SW evaluating for possible DC to hotel w/ voucher, PT to evaluate in the interim**  Brief/Interim Summary: Patient is a 68 year old African American male with past medical history significant for recent COVID-19 illness for which patient was admitted and discharged from the hospital about 2 weeks ago, pulmonary embolism, hypertension, hyperlipidemia, HIV, gout, diabetes mellitus, depression, crack cocaine use, coronary artery disease, asthma and atrial fibrillation.  Patient presents with new onset of shortness of breath, weakness and fatigue that started about 3 days ago.  Patient also reports pain light headache, dizzy, with associated nausea, vomiting and diarrhea.  Repeat COVID-19 test is positive.  Significantly, patient blood sugar is 591, with worsening serum creatinine (up from 1.48 to 2.03), CO2 was 20 but no anion gap.  No headache, no neck pain, no fever or chills, no urinary symptoms.  Patient will be admitted for further assessment and management. On presentation to the ER, temperature was 99.2, heart rate ranged from 82 to 104 bpm, respiratory rate of 22, blood pressure of 107-180/62-106 mmHg, O2 sat of 100%.  Patient has been adequately hydrated, and started on insulin drip.  Patient admitted as above with acute symptomatic shortness of breath without hypoxia.  She was  minimally tachypneic at intake, afebrile, noted to have remarkably uncontrolled diabetes with elevated glucose but no anion gap - A1C 12.6.  Patient was initiated on insulin drip which was subsequently discontinued, patient was covered with sliding scale insulin, home insulin regiment with moderate control.  Time as patient remains without hypoxia, will discharge back to previous facility.  Patient was discharged from our facility here some 2 weeks ago with presumed COVID-19 pneumonia and had improved.  CTA chest was performed to rule out PE given COVID predisposition for hypercoagulable state.  CTA of the chest essentially unremarkable as below for acute findings of the lungs - esophagus appears somewhat irregular but this can be followed up in the outpatient setting. COVID swab may continue to be positive despite the fact that he has recovered from the illness.  Patient otherwise stable and agreeable for discharge back to facility.  Discharge Diagnoses:  Principal Problem:   Respiratory distress Active Problems:   Uncontrolled type 2 diabetes with neuropathy ([name])   Asthma, chronic   Chronic kidney disease, stage III (moderate) ([name])  Respiratory distress without hypoxia, unlikely pneumonia Uncontrolled diabetes type 2, elevating, without gap, without DKA CKD stage III, stable at baseline  Discharge Instructions   Allergies as of 04/12/2019   No Known Allergies     Medication List    TAKE these medications   Accu-Chek Softclix Lancets lancets USE TO CHECK BLOOD GLUCOSE LEVELS UP TO 4 TIMES DAILY AS DIRECTED   albuterol 108 (90 Base) MCG/ACT inhaler Commonly known as: ProAir HFA INHALE TWO PUFFS INTO THE LUNGS EVERY 6 (SIX) HOURS AS NEEDED FOR WHEEZING OR SHORTNESS OF BREATH. What changed:   how much to take  how to take this  when to take this  reasons to take this  additional instructions   allopurinol 300 MG tablet Commonly known as: ZYLOPRIM Take 1 tablet (300 mg total)  by mouth daily. For gout.   aspirin 81 MG chewable tablet Chew 81 mg by mouth daily.   benzonatate 100 MG capsule Commonly known as: TESSALON Take 2 capsules (200 mg total) by mouth 3 (three) times daily as needed for cough.   emtricitabine-rilpivir-tenofovir AF 200-25-25 MG Tabs tablet Commonly known as: ODEFSEY Take 1 tablet by mouth daily with breakfast.   Flovent Diskus 100 MCG/BLIST Aepb Generic drug: Fluticasone Propionate (Inhal) Inhale 1 puff into the lungs 2 (two) times daily.   gabapentin 400 MG capsule Commonly known as: NEURONTIN Take 1 capsule (400 mg total) by mouth 2 (two) times daily. For agitation/neuropathic pain   guaiFENesin 600 MG 12 hr tablet Commonly known as: MUCINEX Take 1 tablet (600 mg total) by mouth 2 (two) times daily. What changed:   when to take this  reasons to take this   hydrOXYzine 25 MG tablet Commonly known as: ATARAX/VISTARIL Take 1 tablet (25 mg total) by mouth 3 (three) times daily as needed for anxiety.   insulin glargine 100 UNIT/ML injection Commonly known as: LANTUS Inject 0.3 mLs (30 Units total) into the skin at bedtime. For diabetes management   insulin lispro 100 UNIT/ML injection Commonly known as: HumaLOG Inject 0.05 mLs (5 Units total) into the skin 3 (three) times daily with meals. For diabetes management What changed:   how much to take  when to take this  additional instructions   Insulin Syringe-Needle U-100 27G X 5/8" 1 ML Misc Commonly known as: B-D INS SYR MICROFINE 1CC/27G 1 strip by Does not apply route 3 (three) times daily as needed. For blood sugar checks   metFORMIN 1000 MG tablet Commonly known as: GLUCOPHAGE Take 1,000 mg by mouth 2 (two) times daily.   sertraline 50 MG tablet Commonly known as: ZOLOFT Take 1 tablet (50 mg total) by mouth daily.   traZODone 50 MG tablet Commonly known as: DESYREL Take 1 tablet (50 mg total) by mouth at bedtime. What changed:   when to take  this  reasons to take this       No Known Allergies  Procedures/Studies: Ct Head Wo Contrast  Result Date: 04/11/2019 CLINICAL DATA:  Patient with headache for 3-4 days. Recently recovered from [place]. EXAM: CT HEAD WITHOUT CONTRAST TECHNIQUE: Contiguous axial images were obtained from the base of the skull through the vertex without intravenous contrast. COMPARISON:  Brain CT 09/02/2018 FINDINGS: Brain: Ventricles and sulci are prominent compatible with atrophy. Periventricular and subcortical white matter hypodensity compatible with chronic microvascular ischemic changes. No evidence for acute cortically based infarct, intracranial hemorrhage, mass lesion or mass-effect. Vascular: Unremarkable Skull: Intact. Sinuses/Orbits: Paranasal sinuses are well aerated. Mastoid air cells are unremarkable. Orbits are unremarkable. Other: None. IMPRESSION: No acute intracranial process. Atrophy and chronic microvascular ischemic changes. Electronically Signed   By: Lovey Newcomer M.D.   On: 04/11/2019 16:40   Ct Angio Chest Pe W Or Wo Contrast  Result Date: 04/12/2019 CLINICAL DATA:  Pt states that he has been short of breath and can't catch his breath Patient discharged form [facility] on 6/12, repeat covid test + history of PE EXAM: CT ANGIOGRAPHY CHEST WITH CONTRAST TECHNIQUE: Multidetector CT imaging of the chest was performed using the standard protocol during bolus administration of intravenous contrast. Multiplanar CT image reconstructions and MIPs were obtained to evaluate  the vascular anatomy. CONTRAST:  79mL OMNIPAQUE IOHEXOL 350 MG/ML SOLN COMPARISON:  03/19/2019 FINDINGS: Cardiovascular: The pulmonary arteries are well opacified and there is no acute pulmonary embolus. Status post CABG. Note is made of bovine arch anatomy. Aorta is tortuous but not aneurysmal. There is minimal atherosclerotic calcification of the thoracic arch. Ascending aorta is 3.2 centimeters. Mediastinum/Nodes: There is Conference will  thickening of the thoracic esophagus throughout its course, without mass. No mediastinal, axillary, or hilar adenopathy. The visualized portion of the thyroid gland has a normal appearance. Lungs/Pleura: There has been interval development of peripheral irregular ground-glass opacities primarily within the UPPER lobes but also involving the LOWER lobes. There is stable RIGHT LOWER lobe atelectasis or scarring. Mild bronchial wall thickening. No pleural effusions or frank consolidations. Upper Abdomen: No acute abnormality. Musculoskeletal: Median sternotomy. Midthoracic spondylosis. Review of the MIP images confirms the above findings. IMPRESSION: 1. Technically adequate exam showing no acute pulmonary embolus. 2. Ascending thoracic aneurysm, 3.2 centimeters. Recommend annual imaging followup by CTA or MRA. This recommendation follows 2010 ACCF/AHA/AATS/ACR/ASA/SCA/SCAI/SIR/STS/SVM Guidelines for the Diagnosis and Management of Patients with Thoracic Aortic Disease. Circulation.2010; 121: F026-V785. Aortic aneurysm NOS (ICD10-I71.9) 3. Interval development of peripheral irregular ground-glass opacities primarily within the UPPER lobes. 4. Esophageal wall thickening, nonspecific but possibly related to esophagitis. Electronically Signed   By: Nolon Nations M.D.   On: 04/12/2019 10:09   Ct Angio Chest Pe W And/or Wo Contrast  Result Date: 03/19/2019 CLINICAL DATA:  Chest pain, fever, not productive cough and generalized body aches for 3 days. Positive D-dimer. EXAM: CT ANGIOGRAPHY CHEST WITH CONTRAST TECHNIQUE: Multidetector CT imaging of the chest was performed using the standard protocol during bolus administration of intravenous contrast. Multiplanar CT image reconstructions and MIPs were obtained to evaluate the vascular anatomy. CONTRAST:  143mL OMNIPAQUE IOHEXOL 350 MG/ML SOLN COMPARISON:  Chest CT 10/02/2018 FINDINGS: Cardiovascular: The heart is normal in size and stable. No pericardial effusion. There  is mild tortuosity and scattered calcifications involving the thoracic aorta. Stable surgical changes from coronary artery bypass surgery and stable coronary artery calcifications. Left atrial appendage closure device noted. The pulmonary arterial tree is well opacified. No filling defects to suggest pulmonary embolism. Mediastinum/Nodes: Small scattered mediastinal and hilar lymph nodes but no mass or adenopathy. Mildly thick-walled patulous esophagus is noted without evidence of a mass. Esophagitis is possible. Recommend clinical correlation with any symptoms of dysphagia. Lungs/Pleura: Stable scarring changes involving the left upper lobe medially likely related to surgery. There are also right basilar scarring changes. No infiltrates or effusions. No worrisome pulmonary lesions or pulmonary edema. No bronchiectasis or interstitial lung disease. Upper Abdomen: No significant upper abdominal findings. No worrisome hepatic or adrenal gland lesions. Musculoskeletal: No chest wall mass, supraclavicular or axillary adenopathy. The bony thorax is intact. Stable surgical changes involving the lower cervical spine and sternum. Degenerative changes involving the thoracic spine but no worrisome bone lesions. Review of the MIP images confirms the above findings. IMPRESSION: 1. No CT findings for acute pulmonary embolism. 2. No evidence of aortic aneurysm or dissection. 3. Slightly thick-walled patulous esophagus without evidence for mass. Possible esophagitis. Recommend clinical correlation. 4. Left upper lobe and right basilar scarring changes but no worrisome pulmonary lesions or acute pulmonary findings. Aortic Atherosclerosis (ICD10-I70.0). Electronically Signed   By: Marijo Sanes M.D.   On: 03/19/2019 14:17   Dg Chest Portable 1 View  Result Date: 04/11/2019 CLINICAL DATA:  Shortness of breath. EXAM: PORTABLE CHEST 1 VIEW COMPARISON:  Chest  radiograph 03/19/2019 FINDINGS: Monitoring leads overlie the patient.  Patient status post median sternotomy. Normal cardiac and mediastinal contours. No consolidative pulmonary opacities. No pleural effusion or pneumothorax. IMPRESSION: No acute cardiopulmonary process. Electronically Signed   By: Lovey Newcomer M.D.   On: 04/11/2019 15:54   Dg Chest Portable 1 View  Result Date: 03/19/2019 CLINICAL DATA:  Nonproductive cough and fever for several days EXAM: PORTABLE CHEST 1 VIEW COMPARISON:  01/10/2019 FINDINGS: Postsurgical changes are noted and stable. Elevation the right hemidiaphragm is noted greater than that seen on the prior exam. No focal infiltrate or sizable effusion is seen. No bony abnormality is noted. Postsurgical changes in the cervical spine are seen. IMPRESSION: Increase in elevation of the right hemidiaphragm without definitive infiltrate. No acute abnormality noted. Electronically Signed   By: Inez Catalina M.D.   On: 03/19/2019 11:12    Subjective: Patient continues to complain of minimal shortness of breath, remains without hypoxia, otherwise declines chest pain, fevers, chills, nausea, vomiting, diarrhea, constipation, headache.   Discharge Exam: Vitals:   04/12/19 0734 04/12/19 1204  BP: 124/74 111/75  Pulse: 92   Resp: 15 15  Temp: 98.2 F (36.8 C) 98.3 F (36.8 C)  SpO2: 100% 99%   Vitals:   04/12/19 0609 04/12/19 0611 04/12/19 0734 04/12/19 1204  BP: (!) 124/103 104/73 124/74 111/75  Pulse: (!) 101 (!) 105 92   Resp: (!) 22 (!) 24 15 15   Temp:   98.2 F (36.8 C) 98.3 F (36.8 C)  TempSrc:   Oral Oral  SpO2: 100% 100% 100% 99%  Weight:      Height:        General:  Pleasantly resting in bed, No acute distress. HEENT:  Normocephalic atraumatic.  Sclerae nonicteric, noninjected.  Extraocular movements intact bilaterally. Neck:  Without mass or deformity.  Trachea is midline. Lungs:  Clear to auscultate bilaterally without rhonchi, wheeze, or rales. Heart:  Regular rate and rhythm.  Without murmurs, rubs, or gallops. Abdomen:   Soft, nontender, nondistended.  Without guarding or rebound. Extremities: Without cyanosis, clubbing, edema, or obvious deformity. Vascular:  Dorsalis pedis and posterior tibial pulses palpable bilaterally. Skin:  Warm and dry, no erythema, no ulcerations.   The results of significant diagnostics from this hospitalization (including imaging, microbiology, ancillary and laboratory) are listed below for reference.     Microbiology: Recent Results (from the past 240 hour(s))  SARS Coronavirus 2 (CEPHEID- Performed in [facility] hospital lab), Hosp Order     Status: Abnormal   Collection Time: 04/11/19  3:40 PM   Specimen: Nasopharyngeal Swab  Result Value Ref Range Status   SARS Coronavirus 2 POSITIVE (A) NEGATIVE Final    Comment: RESULT CALLED TO, READ BACK BY AND VERIFIED WITH: SIMPSON AT 1756ON 04/11/2019 BY JPM (NOTE) If result is NEGATIVE SARS-CoV-2 target nucleic acids are NOT DETECTED. The SARS-CoV-2 RNA is generally detectable in upper and lower  respiratory specimens during the acute phase of infection. The lowest  concentration of SARS-CoV-2 viral copies this assay can detect is 250  copies / mL. A negative result does not preclude SARS-CoV-2 infection  and should not be used as the sole basis for treatment or other  patient management decisions.  A negative result may occur with  improper specimen collection / handling, submission of specimen other  than nasopharyngeal swab, presence of viral mutation(s) within the  areas targeted by this assay, and inadequate number of viral copies  (<250 copies / mL). A negative result  must be combined with clinical  observations, patient history, and epidemiological information. If result is POSITIVE SARS-CoV-2 target nucleic acids are DETECTED. The  SARS-CoV-2 RNA is generally detectable in upper and lower  respiratory specimens during the acute phase of infection.  Positive  results are indicative of active infection with SARS-CoV-2.   Clinical  correlation with patient history and other diagnostic information is  necessary to determine patient infection status.  Positive results do  not rule out bacterial infection or co-infection with other viruses. If result is PRESUMPTIVE POSTIVE SARS-CoV-2 nucleic acids MAY BE PRESENT.   A presumptive positive result was obtained on the submitted specimen  and confirmed on repeat testing.  While 2019 novel coronavirus  (SARS-CoV-2) nucleic acids may be present in the submitted sample  additional confirmatory testing may be necessary for epidemiological  and / or clinical management purposes  to differentiate between  SARS-CoV-2 and other Sarbecovirus currently known to infect humans.  If clinically indicated additional testing with an alternate test  methodology [phone]) is a dvised. The SARS-CoV-2 RNA is generally  detectable in upper and lower respiratory specimens during the acute  phase of infection. The expected result is Negative. Fact Sheet for Patients:  StrictlyIdeas.no Fact Sheet for Healthcare Providers: BankingDealers.co.za This test is not yet approved or cleared by the [place] FDA and has been authorized for detection and/or diagnosis of SARS-CoV-2 by FDA under an Emergency Use Authorization (EUA).  This EUA will remain in effect (meaning this test can be used) for the duration of the COVID-19 declaration under Section 564(b)(1) of the Act, 21 U.S.C. section 360bbb-3(b)(1), unless the authorization is terminated or revoked sooner. Performed at [facility], [place] [address], [place], [place] [address]   C difficile quick scan w PCR reflex     Status: None   Collection Time: 04/12/19  3:36 AM   Specimen: Stool  Result Value Ref Range Status   C Diff antigen NEGATIVE NEGATIVE Final   C Diff toxin NEGATIVE NEGATIVE Final   C Diff interpretation No C. difficile detected.  Final    Comment:  Performed at [facility], [place] [address] [place], [place] [address]     Labs: BNP (last 3 results) Recent Labs    05/04/18 0237 04/11/19 1539  BNP 36.1 15.7   Basic Metabolic Panel: Recent Labs  Lab 04/11/19 1538 04/12/19 0600 04/12/19 0910  NA 129* 135 134*  K 4.0 3.9 3.9  CL 97* 100 104  CO2 20* 22 24  GLUCOSE 591* 321* 362*  BUN 39* 33* 31*  CREATININE 2.03* 1.92* 1.78*  CALCIUM 7.5* 8.4* 7.8*  MG  --   --  1.9  PHOS  --   --  2.4*   Liver Function Tests: Recent Labs  Lab 04/11/19 1538  AST 15  ALT 11  ALKPHOS 72  BILITOT 0.7  PROT 7.0  ALBUMIN 3.2*   No results for input(s): LIPASE, AMYLASE in the last 168 hours. No results for input(s): AMMONIA in the last 168 hours. CBC: Recent Labs  Lab 04/11/19 1538 04/12/19 0600  WBC 5.3 6.3  HGB 11.2* 11.2*  HCT 35.2* 34.2*  MCV 81.7 79.7*  PLT 178 269   Cardiac Enzymes: No results for input(s): CKTOTAL, CKMB, CKMBINDEX, TROPONINI in the last 168 hours. BNP: Invalid input(s): POCBNP CBG: Recent Labs  Lab 04/11/19 2043 04/11/19 2220 04/11/19 2350 04/12/19 0332 04/12/19 0813  GLUCAP 180* 246* 262* 240* 346*   D-Dimer Recent Labs  04/12/19 0600  DDIMER 2.41*   Hgb A1c No results for input(s): HGBA1C in the last 72 hours. Lipid Profile No results for input(s): CHOL, HDL, LDLCALC, TRIG, CHOLHDL, LDLDIRECT in the last 72 hours. Thyroid function studies No results for input(s): TSH, T4TOTAL, T3FREE, THYROIDAB in the last 72 hours.  Invalid input(s): FREET3 Anemia work up Recent Labs    04/12/19 0600  FERRITIN 125   Urinalysis    Component Value Date/Time   COLORURINE STRAW (A) 03/19/2019 1446   APPEARANCEUR CLEAR 03/19/2019 1446   LABSPEC 1.022 03/19/2019 1446   PHURINE 6.0 03/19/2019 1446   GLUCOSEU >=500 (A) 03/19/2019 1446   HGBUR SMALL (A) 03/19/2019 1446   BILIRUBINUR NEGATIVE 03/19/2019 1446   KETONESUR NEGATIVE 03/19/2019 1446   PROTEINUR 100 (A)  03/19/2019 1446   UROBILINOGEN 0.2 08/13/2015 1030   NITRITE NEGATIVE 03/19/2019 1446   LEUKOCYTESUR NEGATIVE 03/19/2019 1446   Sepsis Labs Invalid input(s): PROCALCITONIN,  WBC,  LACTICIDVEN Microbiology Recent Results (from the past 240 hour(s))  SARS Coronavirus 2 (CEPHEID- Performed in [facility] hospital lab), Hosp Order     Status: Abnormal   Collection Time: 04/11/19  3:40 PM   Specimen: Nasopharyngeal Swab  Result Value Ref Range Status   SARS Coronavirus 2 POSITIVE (A) NEGATIVE Final    Comment: RESULT CALLED TO, READ BACK BY AND VERIFIED WITH: SIMPSON AT 1756ON 04/11/2019 BY JPM (NOTE) If result is NEGATIVE SARS-CoV-2 target nucleic acids are NOT DETECTED. The SARS-CoV-2 RNA is generally detectable in upper and lower  respiratory specimens during the acute phase of infection. The lowest  concentration of SARS-CoV-2 viral copies this assay can detect is 250  copies / mL. A negative result does not preclude SARS-CoV-2 infection  and should not be used as the sole basis for treatment or other  patient management decisions.  A negative result may occur with  improper specimen collection / handling, submission of specimen other  than nasopharyngeal swab, presence of viral mutation(s) within the  areas targeted by this assay, and inadequate number of viral copies  (<250 copies / mL). A negative result must be combined with clinical  observations, patient history, and epidemiological information. If result is POSITIVE SARS-CoV-2 target nucleic acids are DETECTED. The  SARS-CoV-2 RNA is generally detectable in upper and lower  respiratory specimens during the acute phase of infection.  Positive  results are indicative of active infection with SARS-CoV-2.  Clinical  correlation with patient history and other diagnostic information is  necessary to determine patient infection status.  Positive results do  not rule out bacterial infection or co-infection with other viruses. If  result is PRESUMPTIVE POSTIVE SARS-CoV-2 nucleic acids MAY BE PRESENT.   A presumptive positive result was obtained on the submitted specimen  and confirmed on repeat testing.  While 2019 novel coronavirus  (SARS-CoV-2) nucleic acids may be present in the submitted sample  additional confirmatory testing may be necessary for epidemiological  and / or clinical management purposes  to differentiate between  SARS-CoV-2 and other Sarbecovirus currently known to infect humans.  If clinically indicated additional testing with an alternate test  methodology [phone]) is a dvised. The SARS-CoV-2 RNA is generally  detectable in upper and lower respiratory specimens during the acute  phase of infection. The expected result is Negative. Fact Sheet for Patients:  StrictlyIdeas.no Fact Sheet for Healthcare Providers: BankingDealers.co.za This test is not yet approved or cleared by the [place] FDA and has been authorized for detection and/or diagnosis of  SARS-CoV-2 by FDA under an Emergency Use Authorization (EUA).  This EUA will remain in effect (meaning this test can be used) for the duration of the COVID-19 declaration under Section 564(b)(1) of the Act, 21 U.S.C. section 360bbb-3(b)(1), unless the authorization is terminated or revoked sooner. Performed at [facility], [place] [address], [place], [place] [address]   C difficile quick scan w PCR reflex     Status: None   Collection Time: 04/12/19  3:36 AM   Specimen: Stool  Result Value Ref Range Status   C Diff antigen NEGATIVE NEGATIVE Final   C Diff toxin NEGATIVE NEGATIVE Final   C Diff interpretation No C. difficile detected.  Final    Comment: Performed at [facility], [place] [address], [place], [place] [address]     Time coordinating discharge: Over 30 minutes  SIGNED: Little Ishikawa, DO [facility] 04/12/2019, 1:06 PM Pager   If  7PM-7AM, please contact night-coverage [email or web address] Password [email or web address]

## 2019-04-12 NOTE — Progress Notes (Signed)
   04/12/19 0607 04/12/19 0609 04/12/19 0611  Vitals  BP 114/75 (!) 124/103 104/73  MAP (mmHg) 87 112 84  BP Location Right Arm Right Arm Right Arm  BP Method Automatic Automatic Automatic  Patient Position (if appropriate) Lying Sitting Standing  Pulse Rate 100 (!) 101 (!) 105  Pulse Rate Source Monitor  --  Monitor  ECG Heart Rate (!) 101 (!) 102 (!) 108   Orthostatic VS

## 2019-04-12 NOTE — Plan of Care (Signed)
  Problem: Education: Goal: Knowledge of General Education information will improve Description Including pain rating scale, medication(s)/side effects and non-pharmacologic comfort measures Outcome: Progressing   Problem: Health Behavior/Discharge Planning: Goal: Ability to manage health-related needs will improve Outcome: Progressing   Problem: Clinical Measurements: Goal: Ability to maintain clinical measurements within normal limits will improve Outcome: Progressing Goal: Will remain free from infection Outcome: Progressing Goal: Diagnostic test results will improve Outcome: Progressing Goal: Respiratory complications will improve Outcome: Progressing Goal: Cardiovascular complication will be avoided Outcome: Progressing   Problem: Activity: Goal: Risk for activity intolerance will decrease Outcome: Progressing   Problem: Nutrition: Goal: Adequate nutrition will be maintained Outcome: Progressing   Problem: Elimination: Goal: Will not experience complications related to bowel motility Outcome: Progressing Goal: Will not experience complications related to urinary retention Outcome: Progressing   Problem: Pain Managment: Goal: General experience of comfort will improve Outcome: Progressing   

## 2019-04-12 NOTE — Progress Notes (Addendum)
Patient having difficulty with mobility, generalized weakness, may need assistance at home.   Patient had incontinence of bowel and difficulty standing for being cleaned.

## 2019-04-12 NOTE — Progress Notes (Addendum)
CSW was in process with [facility] of securing hotel room. CSW received call from nurse concerned about patient going to hotel room alone as he continues to be weak and need occasional assistance ambulating. CSW notes no PT/OT during this visit.   CSW informed MD of nurse concerns, requested PT/OT consult before patient discharges to hotel, and any potential DME needs.   [place], [place]

## 2019-04-12 NOTE — Progress Notes (Signed)
Pt sister, [name] updated on patient condition and plan of care. All questions answered.

## 2019-04-12 NOTE — Progress Notes (Signed)
CBG 240 

## 2019-04-12 NOTE — Progress Notes (Addendum)
Inpatient Diabetes Program Recommendations  AACE/ADA: New Consensus Statement on Inpatient Glycemic Control (2015)  Target Ranges:  Prepandial:   less than 140 mg/dL      Peak postprandial:   less than 180 mg/dL (1-2 hours)      Critically ill patients:  140 - 180 mg/dL   Lab Results  Component Value Date   GLUCAP 346 (H) 04/12/2019   HGBA1C 12.6 (H) 03/19/2019    Review of Glycemic Control Results for [place], REAS (MRN [id number]) as of 04/12/2019 12:53  Ref. Range 04/11/2019 22:20 04/11/2019 23:50 04/12/2019 03:32 04/12/2019 08:13  Glucose-Capillary Latest Ref Range: 70 - 99 mg/dL 246 (H) 262 (H) 240 (H) 346 (H)   Diabetes history: DM2 Outpatient Diabetes medications: Lantus 30 units + Novolog 4 units tid meal coverage + Metformin 1 gm bid. Current orders for Inpatient glycemic control:  Lantus 30 units + Novolog sensitive correction q 4 hrs.  Inpatient Diabetes Program Recommendations:   Noted current blood glucose 362. Patient did not receive basal insulin prior to IV insulin drip discontinued. -Restart IV insulin drip and continue 2 hrs. After Lantus 30 units given now instead of tonight. -Cover CBG with correction when IV insulin discontinued -Add Novolog 4 units tid meal coverage if eating 50% Secure chat sent to Dr. Avon Gully.  Thank you, Nani Gasser. Alejah Aristizabal, RN, MSN, CDE  Diabetes Coordinator Inpatient Glycemic Control Team Team Pager [phone] (8am-5pm) 04/12/2019 12:44 PM

## 2019-04-13 ENCOUNTER — Other Ambulatory Visit: Payer: Self-pay | Admitting: *Deleted

## 2019-04-13 LAB — GLUCOSE, CAPILLARY
Glucose-Capillary: 117 mg/dL — ABNORMAL HIGH (ref 70–99)
Glucose-Capillary: 128 mg/dL — ABNORMAL HIGH (ref 70–99)
Glucose-Capillary: 263 mg/dL — ABNORMAL HIGH (ref 70–99)
Glucose-Capillary: 239 mg/dL — ABNORMAL HIGH (ref 70–99)
Glucose-Capillary: 273 mg/dL — ABNORMAL HIGH (ref 70–99)

## 2019-04-13 LAB — C-REACTIVE PROTEIN: CRP: 0.9 mg/dL (ref <1.0)

## 2019-04-13 LAB — D-DIMER, QUANTITATIVE: D-Dimer, Quant: 1.47 ug/mL-FEU — ABNORMAL HIGH (ref 0.00–0.50)

## 2019-04-13 LAB — FERRITIN: Ferritin: 87 ng/mL (ref 24–336)

## 2019-04-13 MED ORDER — INSULIN ASPART 100 UNIT/ML ~~LOC~~ SOLN
0.0000 [IU] | Freq: Every day | SUBCUTANEOUS | Status: DC
  Administered 2019-04-13: 3 [IU] via SUBCUTANEOUS
  Administered 2019-04-14: 22:00:00 4 [IU] via SUBCUTANEOUS
  Administered 2019-04-15 – 2019-04-16 (×2): 3 [IU] via SUBCUTANEOUS
  Administered 2019-04-17 – 2019-04-18 (×2): 2 [IU] via SUBCUTANEOUS
  Administered 2019-04-19: 3 [IU] via SUBCUTANEOUS
  Administered 2019-04-20: 5 [IU] via SUBCUTANEOUS

## 2019-04-13 MED ORDER — INSULIN ASPART 100 UNIT/ML ~~LOC~~ SOLN
0.0000 [IU] | Freq: Three times a day (TID) | SUBCUTANEOUS | Status: DC
  Administered 2019-04-13: 8 [IU] via SUBCUTANEOUS
  Administered 2019-04-13 – 2019-04-15 (×5): 5 [IU] via SUBCUTANEOUS
  Administered 2019-04-15: 3 [IU] via SUBCUTANEOUS
  Administered 2019-04-15 – 2019-04-16 (×2): 5 [IU] via SUBCUTANEOUS
  Administered 2019-04-16: 2 [IU] via SUBCUTANEOUS
  Administered 2019-04-16 – 2019-04-17 (×2): 8 [IU] via SUBCUTANEOUS
  Administered 2019-04-17 – 2019-04-19 (×4): 3 [IU] via SUBCUTANEOUS
  Administered 2019-04-19: 5 [IU] via SUBCUTANEOUS
  Administered 2019-04-20: 2 [IU] via SUBCUTANEOUS
  Administered 2019-04-20: 5 [IU] via SUBCUTANEOUS
  Administered 2019-04-21: 3 [IU] via SUBCUTANEOUS
  Administered 2019-04-21: 2 [IU] via SUBCUTANEOUS

## 2019-04-13 NOTE — Patient Outreach (Signed)
[facility]) Care Management  04/13/2019  Michael [name] April 27, 1951 [id number]   RN Health Coach case closure. [facility] with [facility]  Liaison Plan: Case closure  [facility] [phone]

## 2019-04-13 NOTE — Plan of Care (Signed)
  Problem: Education: Goal: Knowledge of General Education information will improve Description Including pain rating scale, medication(s)/side effects and non-pharmacologic comfort measures Outcome: Progressing   Problem: Health Behavior/Discharge Planning: Goal: Ability to manage health-related needs will improve Outcome: Progressing   Problem: Clinical Measurements: Goal: Ability to maintain clinical measurements within normal limits will improve Outcome: Progressing Goal: Will remain free from infection Outcome: Progressing Goal: Diagnostic test results will improve Outcome: Progressing Goal: Respiratory complications will improve Outcome: Progressing Goal: Cardiovascular complication will be avoided Outcome: Progressing   Problem: Activity: Goal: Risk for activity intolerance will decrease Outcome: Progressing   Problem: Nutrition: Goal: Adequate nutrition will be maintained Outcome: Progressing   Problem: Elimination: Goal: Will not experience complications related to bowel motility Outcome: Progressing Goal: Will not experience complications related to urinary retention Outcome: Progressing   Problem: Pain Managment: Goal: General experience of comfort will improve Outcome: Progressing   

## 2019-04-13 NOTE — Consult Note (Addendum)
   [facility] Inpatient Consult   04/13/2019  Michael [name] 15-Jan-1951 [id number]    Patient screened for potential needs of [facility] care management services under his [place], with a 41% extreme high risk score for unplanned readmission and hospitalization. Noted with readmission within 30 days, 2 hospitalizations and 3 ED visits in the past 6 months  Patient had been outreached by [facility] care management coordinators and [place] in the past. Our community based plan of care has focused on disease management and community resource support.  Chart review and history & physical on 04/11/19 show as follows: Patient is a 68 year old African American male with past medical history significant for recent COVID-19 illness for which patient was admitted and discharged from the hospital about 2 weeks ago, pulmonary embolism, hypertension, hyperlipidemia, HIV, gout, diabetes mellitus, depression, crack cocaine use, coronary artery disease, asthma and atrial fibrillation.   Patient presented with new onset of shortness of breath,weakness and fatigue that started about 3 days ago. Patient also reports pain light headache, dizzy, with associated nausea, vomiting and diarrhea.  Repeat COVID-19 test is positive.  Significantly, patient blood sugar is 591, with worsening serum creatinine (up from 1.48 to 2.03), CO2 was 20 but no anion gap. Patient was readmitted for further assessment and management  Primary care provider is [name], PA with [facility].   PT/ OT evaluation reviewed and is being recommended for skilled level therapies (SNF) related to poor balance, weakness, fatigues quickly during simple tasks and would have difficulty performing ADL's and functional mobility safely alone in a motel.   Please refer to [facility] care management if there are changes in current disposition needs for follow-up as appropriate.     Addendum:   Spoke with transition of care SW  Caryl Pina) about ongoing issues regarding post hospital disposition. Discussed possibility of peer to peer review which might be needed.    For additional questions or referrals please contact:   Edwena Felty A. Salih Williamson, BSN, RN-BC [facility]

## 2019-04-13 NOTE — Progress Notes (Signed)
Physician Discharge Summary  Michael [name] BOF:[id number] DOB: 08/27/1951 DOA: 04/11/2019  PCP: [name], PA-C  Admit date: 04/11/2019 Discharge date: 04/13/2019  Admitted From: Rehab center Disposition: Rehab center  Recommendations for Outpatient Follow-up:  1. Follow up with PCP in 1-2 weeks 2. Please obtain BMP/CBC in one week  Discharge Condition: Stable CODE STATUS: Full Diet recommendation: Diabetic  Patient remained overnight due to social issues - essentially homeless no longer able to go to previous rehab/detox facility; family (where he was discharged to last time) is unavailable - CM/SW evaluating for DC to SNF pending approval - medically stable for discharge  Brief/Interim Summary: Patient is a 68 year old African American male with past medical history significant for recent COVID-19 illness for which patient was admitted and discharged from the hospital about 2 weeks ago, pulmonary embolism, hypertension, hyperlipidemia, HIV, gout, diabetes mellitus, depression, crack cocaine use, coronary artery disease, asthma and atrial fibrillation.  Patient presents with new onset of shortness of breath, weakness and fatigue that started about 3 days ago.  Patient also reports pain light headache, dizzy, with associated nausea, vomiting and diarrhea.  Repeat COVID-19 test is positive.  Significantly, patient blood sugar is 591, with worsening serum creatinine (up from 1.48 to 2.03), CO2 was 20 but no anion gap.  No headache, no neck pain, no fever or chills, no urinary symptoms.  Patient will be admitted for further assessment and management. On presentation to the ER, temperature was 99.2, heart rate ranged from 82 to 104 bpm, respiratory rate of 22, blood pressure of 107-180/62-106 mmHg, O2 sat of 100%.  Patient has been adequately hydrated, and started on insulin drip.  Patient admitted as above with acute symptomatic shortness of breath without hypoxia.  She was minimally  tachypneic at intake, afebrile, noted to have remarkably uncontrolled diabetes with elevated glucose but no anion gap - A1C 12.6.  Patient was initiated on insulin drip which was subsequently discontinued, patient was covered with sliding scale insulin, home insulin regiment with moderate control.  Time as patient remains without hypoxia, will discharge back to previous facility.  Patient was discharged from our facility here some 2 weeks ago with presumed COVID-19 pneumonia and had improved.  CTA chest was performed to rule out PE given COVID predisposition for hypercoagulable state.  CTA of the chest essentially unremarkable as below for acute findings of the lungs - esophagus appears somewhat irregular but this can be followed up in the outpatient setting. COVID swab may continue to be positive despite the fact that he has recovered from the illness. PT evaluating and recommending SNF - disposition pending approval.  Discharge Diagnoses:  Principal Problem:   Respiratory distress Active Problems:   Uncontrolled type 2 diabetes with neuropathy ([name])   Asthma, chronic   Chronic kidney disease, stage III (moderate) ([name])  Respiratory distress without hypoxia, unlikely pneumonia Uncontrolled diabetes type 2, elevating, without gap, without DKA CKD stage III, stable at baseline  Discharge Instructions  Discharge Instructions    Call MD for:  persistant nausea and vomiting   Complete by: As directed    Call MD for:  temperature >100.4   Complete by: As directed    Diet - low sodium heart healthy   Complete by: As directed    Increase activity slowly   Complete by: As directed      Allergies as of 04/13/2019   No Known Allergies     Medication List    TAKE these medications   Accu-Chek  Softclix Lancets lancets USE TO CHECK BLOOD GLUCOSE LEVELS UP TO 4 TIMES DAILY AS DIRECTED Notes to patient: Resume home schedule 04/12/2019   albuterol 108 (90 Base) MCG/ACT inhaler Commonly known as:  ProAir HFA INHALE TWO PUFFS INTO THE LUNGS EVERY 6 (SIX) HOURS AS NEEDED FOR WHEEZING OR SHORTNESS OF BREATH. What changed:   how much to take  how to take this  when to take this  reasons to take this  additional instructions Notes to patient: Resume home schedule 04/12/2019   allopurinol 300 MG tablet Commonly known as: ZYLOPRIM Take 1 tablet (300 mg total) by mouth daily. For gout. Notes to patient: Resume home schedule 04/12/2019   aspirin 81 MG chewable tablet Chew 81 mg by mouth daily.   benzonatate 100 MG capsule Commonly known as: TESSALON Take 2 capsules (200 mg total) by mouth 3 (three) times daily as needed for cough. Notes to patient: Resume home schedule 04/12/2019   emtricitabine-rilpivir-tenofovir AF 200-25-25 MG Tabs tablet Commonly known as: ODEFSEY Take 1 tablet by mouth daily with breakfast. Notes to patient: Resume home schedule 04/12/2019   Flovent Diskus 100 MCG/BLIST Aepb Generic drug: Fluticasone Propionate (Inhal) Inhale 1 puff into the lungs 2 (two) times daily. Notes to patient: Resume home schedule 04/12/2019   gabapentin 400 MG capsule Commonly known as: NEURONTIN Take 1 capsule (400 mg total) by mouth 2 (two) times daily. For agitation/neuropathic pain Notes to patient: Resume home schedule 04/12/2019   guaiFENesin 600 MG 12 hr tablet Commonly known as: MUCINEX Take 1 tablet (600 mg total) by mouth 2 (two) times daily. What changed:   when to take this  reasons to take this Notes to patient: Resume home schedule 04/12/2019   hydrOXYzine 25 MG tablet Commonly known as: ATARAX/VISTARIL Take 1 tablet (25 mg total) by mouth 3 (three) times daily as needed for anxiety. Notes to patient: Resume home schedule 04/12/2019   insulin glargine 100 UNIT/ML injection Commonly known as: LANTUS Inject 0.3 mLs (30 Units total) into the skin at bedtime. For diabetes management Notes to patient: Resume home schedule 04/12/2019   insulin lispro 100 UNIT/ML  injection Commonly known as: HumaLOG Inject 0.05 mLs (5 Units total) into the skin 3 (three) times daily with meals. For diabetes management What changed:   how much to take  when to take this  additional instructions Notes to patient: Resume home schedule 04/12/2019   Insulin Syringe-Needle U-100 27G X 5/8" 1 ML Misc Commonly known as: B-D INS SYR MICROFINE 1CC/27G 1 strip by Does not apply route 3 (three) times daily as needed. For blood sugar checks Notes to patient: Resume home schedule 04/12/2019   metFORMIN 1000 MG tablet Commonly known as: GLUCOPHAGE Take 1,000 mg by mouth 2 (two) times daily. Notes to patient: Resume home schedule 04/12/2019   sertraline 50 MG tablet Commonly known as: ZOLOFT Take 1 tablet (50 mg total) by mouth daily. Notes to patient: Resume home schedule 04/12/2019   traZODone 50 MG tablet Commonly known as: DESYREL Take 1 tablet (50 mg total) by mouth at bedtime. What changed:   when to take this  reasons to take this Notes to patient: Resume home schedule 04/12/2019       No Known Allergies  Procedures/Studies: Ct Head Wo Contrast  Result Date: 04/11/2019 CLINICAL DATA:  Patient with headache for 3-4 days. Recently recovered from [place]. EXAM: CT HEAD WITHOUT CONTRAST TECHNIQUE: Contiguous axial images were obtained from the base of the skull through the vertex without  intravenous contrast. COMPARISON:  Brain CT 09/02/2018 FINDINGS: Brain: Ventricles and sulci are prominent compatible with atrophy. Periventricular and subcortical white matter hypodensity compatible with chronic microvascular ischemic changes. No evidence for acute cortically based infarct, intracranial hemorrhage, mass lesion or mass-effect. Vascular: Unremarkable Skull: Intact. Sinuses/Orbits: Paranasal sinuses are well aerated. Mastoid air cells are unremarkable. Orbits are unremarkable. Other: None. IMPRESSION: No acute intracranial process. Atrophy and chronic microvascular ischemic  changes. Electronically Signed   By: Lovey Newcomer M.D.   On: 04/11/2019 16:40   Ct Angio Chest Pe W Or Wo Contrast  Result Date: 04/12/2019 CLINICAL DATA:  Pt states that he has been short of breath and can't catch his breath Patient discharged form [facility] on 6/12, repeat covid test + history of PE EXAM: CT ANGIOGRAPHY CHEST WITH CONTRAST TECHNIQUE: Multidetector CT imaging of the chest was performed using the standard protocol during bolus administration of intravenous contrast. Multiplanar CT image reconstructions and MIPs were obtained to evaluate the vascular anatomy. CONTRAST:  83mL OMNIPAQUE IOHEXOL 350 MG/ML SOLN COMPARISON:  03/19/2019 FINDINGS: Cardiovascular: The pulmonary arteries are well opacified and there is no acute pulmonary embolus. Status post CABG. Note is made of bovine arch anatomy. Aorta is tortuous but not aneurysmal. There is minimal atherosclerotic calcification of the thoracic arch. Ascending aorta is 3.2 centimeters. Mediastinum/Nodes: There is Conference will thickening of the thoracic esophagus throughout its course, without mass. No mediastinal, axillary, or hilar adenopathy. The visualized portion of the thyroid gland has a normal appearance. Lungs/Pleura: There has been interval development of peripheral irregular ground-glass opacities primarily within the UPPER lobes but also involving the LOWER lobes. There is stable RIGHT LOWER lobe atelectasis or scarring. Mild bronchial wall thickening. No pleural effusions or frank consolidations. Upper Abdomen: No acute abnormality. Musculoskeletal: Median sternotomy. Midthoracic spondylosis. Review of the MIP images confirms the above findings. IMPRESSION: 1. Technically adequate exam showing no acute pulmonary embolus. 2. Ascending thoracic aneurysm, 3.2 centimeters. Recommend annual imaging followup by CTA or MRA. This recommendation follows 2010 ACCF/AHA/AATS/ACR/ASA/SCA/SCAI/SIR/STS/SVM Guidelines for the Diagnosis and Management of  Patients with Thoracic Aortic Disease. Circulation.2010; 121: H419-F790. Aortic aneurysm NOS (ICD10-I71.9) 3. Interval development of peripheral irregular ground-glass opacities primarily within the UPPER lobes. 4. Esophageal wall thickening, nonspecific but possibly related to esophagitis. Electronically Signed   By: Nolon Nations M.D.   On: 04/12/2019 10:09   Ct Angio Chest Pe W And/or Wo Contrast  Result Date: 03/19/2019 CLINICAL DATA:  Chest pain, fever, not productive cough and generalized body aches for 3 days. Positive D-dimer. EXAM: CT ANGIOGRAPHY CHEST WITH CONTRAST TECHNIQUE: Multidetector CT imaging of the chest was performed using the standard protocol during bolus administration of intravenous contrast. Multiplanar CT image reconstructions and MIPs were obtained to evaluate the vascular anatomy. CONTRAST:  117mL OMNIPAQUE IOHEXOL 350 MG/ML SOLN COMPARISON:  Chest CT 10/02/2018 FINDINGS: Cardiovascular: The heart is normal in size and stable. No pericardial effusion. There is mild tortuosity and scattered calcifications involving the thoracic aorta. Stable surgical changes from coronary artery bypass surgery and stable coronary artery calcifications. Left atrial appendage closure device noted. The pulmonary arterial tree is well opacified. No filling defects to suggest pulmonary embolism. Mediastinum/Nodes: Small scattered mediastinal and hilar lymph nodes but no mass or adenopathy. Mildly thick-walled patulous esophagus is noted without evidence of a mass. Esophagitis is possible. Recommend clinical correlation with any symptoms of dysphagia. Lungs/Pleura: Stable scarring changes involving the left upper lobe medially likely related to surgery. There are also right basilar scarring changes.  No infiltrates or effusions. No worrisome pulmonary lesions or pulmonary edema. No bronchiectasis or interstitial lung disease. Upper Abdomen: No significant upper abdominal findings. No worrisome hepatic or  adrenal gland lesions. Musculoskeletal: No chest wall mass, supraclavicular or axillary adenopathy. The bony thorax is intact. Stable surgical changes involving the lower cervical spine and sternum. Degenerative changes involving the thoracic spine but no worrisome bone lesions. Review of the MIP images confirms the above findings. IMPRESSION: 1. No CT findings for acute pulmonary embolism. 2. No evidence of aortic aneurysm or dissection. 3. Slightly thick-walled patulous esophagus without evidence for mass. Possible esophagitis. Recommend clinical correlation. 4. Left upper lobe and right basilar scarring changes but no worrisome pulmonary lesions or acute pulmonary findings. Aortic Atherosclerosis (ICD10-I70.0). Electronically Signed   By: Marijo Sanes M.D.   On: 03/19/2019 14:17   Dg Chest Portable 1 View  Result Date: 04/11/2019 CLINICAL DATA:  Shortness of breath. EXAM: PORTABLE CHEST 1 VIEW COMPARISON:  Chest radiograph 03/19/2019 FINDINGS: Monitoring leads overlie the patient. Patient status post median sternotomy. Normal cardiac and mediastinal contours. No consolidative pulmonary opacities. No pleural effusion or pneumothorax. IMPRESSION: No acute cardiopulmonary process. Electronically Signed   By: Lovey Newcomer M.D.   On: 04/11/2019 15:54   Dg Chest Portable 1 View  Result Date: 03/19/2019 CLINICAL DATA:  Nonproductive cough and fever for several days EXAM: PORTABLE CHEST 1 VIEW COMPARISON:  01/10/2019 FINDINGS: Postsurgical changes are noted and stable. Elevation the right hemidiaphragm is noted greater than that seen on the prior exam. No focal infiltrate or sizable effusion is seen. No bony abnormality is noted. Postsurgical changes in the cervical spine are seen. IMPRESSION: Increase in elevation of the right hemidiaphragm without definitive infiltrate. No acute abnormality noted. Electronically Signed   By: Inez Catalina M.D.   On: 03/19/2019 11:12    Subjective: Patient continues to complain  of minimal shortness of breath, remains without hypoxia, otherwise declines chest pain, fevers, chills, nausea, vomiting, diarrhea, constipation, headache.   Discharge Exam: Vitals:   04/13/19 1500 04/13/19 1557  BP:  134/75  Pulse: 96 90  Resp:    Temp:  98.6 F (37 C)  SpO2: 99% 98%   Vitals:   04/13/19 1300 04/13/19 1400 04/13/19 1500 04/13/19 1557  BP:    134/75  Pulse: 87 81 96 90  Resp: 20     Temp:    98.6 F (37 C)  TempSrc:    Oral  SpO2: 100% 100% 99% 98%  Weight:      Height:        General:  Pleasantly resting in bed, No acute distress. HEENT:  Normocephalic atraumatic.  Sclerae nonicteric, noninjected.  Extraocular movements intact bilaterally. Neck:  Without mass or deformity.  Trachea is midline. Lungs:  Clear to auscultate bilaterally without rhonchi, wheeze, or rales. Heart:  Regular rate and rhythm.  Without murmurs, rubs, or gallops. Abdomen:  Soft, nontender, nondistended.  Without guarding or rebound. Extremities: Without cyanosis, clubbing, edema, or obvious deformity. Vascular:  Dorsalis pedis and posterior tibial pulses palpable bilaterally. Skin:  Warm and dry, no erythema, no ulcerations.   The results of significant diagnostics from this hospitalization (including imaging, microbiology, ancillary and laboratory) are listed below for reference.     Microbiology: Recent Results (from the past 240 hour(s))  SARS Coronavirus 2 (CEPHEID- Performed in [place] hospital lab), Hosp Order     Status: Abnormal   Collection Time: 04/11/19  3:40 PM   Specimen: Nasopharyngeal Swab  Result Value Ref Range Status   SARS Coronavirus 2 POSITIVE (A) NEGATIVE Final    Comment: RESULT CALLED TO, READ BACK BY AND VERIFIED WITH: SIMPSON AT 1756ON 04/11/2019 BY JPM (NOTE) If result is NEGATIVE SARS-CoV-2 target nucleic acids are NOT DETECTED. The SARS-CoV-2 RNA is generally detectable in upper and lower  respiratory specimens during the acute phase of  infection. The lowest  concentration of SARS-CoV-2 viral copies this assay can detect is 250  copies / mL. A negative result does not preclude SARS-CoV-2 infection  and should not be used as the sole basis for treatment or other  patient management decisions.  A negative result may occur with  improper specimen collection / handling, submission of specimen other  than nasopharyngeal swab, presence of viral mutation(s) within the  areas targeted by this assay, and inadequate number of viral copies  (<250 copies / mL). A negative result must be combined with clinical  observations, patient history, and epidemiological information. If result is POSITIVE SARS-CoV-2 target nucleic acids are DETECTED. The  SARS-CoV-2 RNA is generally detectable in upper and lower  respiratory specimens during the acute phase of infection.  Positive  results are indicative of active infection with SARS-CoV-2.  Clinical  correlation with patient history and other diagnostic information is  necessary to determine patient infection status.  Positive results do  not rule out bacterial infection or co-infection with other viruses. If result is PRESUMPTIVE POSTIVE SARS-CoV-2 nucleic acids MAY BE PRESENT.   A presumptive positive result was obtained on the submitted specimen  and confirmed on repeat testing.  While 2019 novel coronavirus  (SARS-CoV-2) nucleic acids may be present in the submitted sample  additional confirmatory testing may be necessary for epidemiological  and / or clinical management purposes  to differentiate between  SARS-CoV-2 and other Sarbecovirus currently known to infect humans.  If clinically indicated additional testing with an alternate test  methodology [phone]) is a dvised. The SARS-CoV-2 RNA is generally  detectable in upper and lower respiratory specimens during the acute  phase of infection. The expected result is Negative. Fact Sheet for Patients:   StrictlyIdeas.no Fact Sheet for Healthcare Providers: BankingDealers.co.za This test is not yet approved or cleared by the [place] FDA and has been authorized for detection and/or diagnosis of SARS-CoV-2 by FDA under an Emergency Use Authorization (EUA).  This EUA will remain in effect (meaning this test can be used) for the duration of the COVID-19 declaration under Section 564(b)(1) of the Act, 21 U.S.C. section 360bbb-3(b)(1), unless the authorization is terminated or revoked sooner. Performed at [facility], [place] [address] [place], [place] [address]   Gastrointestinal Panel by PCR , Stool     Status: None   Collection Time: 04/12/19  3:36 AM   Specimen: Stool  Result Value Ref Range Status   Campylobacter species NOT DETECTED NOT DETECTED Final   Plesimonas shigelloides NOT DETECTED NOT DETECTED Final   Salmonella species NOT DETECTED NOT DETECTED Final   Yersinia enterocolitica NOT DETECTED NOT DETECTED Final   Vibrio species NOT DETECTED NOT DETECTED Final   Vibrio cholerae NOT DETECTED NOT DETECTED Final   Enteroaggregative E coli (EAEC) NOT DETECTED NOT DETECTED Final   Enteropathogenic E coli (EPEC) NOT DETECTED NOT DETECTED Final   Enterotoxigenic E coli (ETEC) NOT DETECTED NOT DETECTED Final   Shiga like toxin producing E coli (STEC) NOT DETECTED NOT DETECTED Final   Shigella/Enteroinvasive E coli (EIEC) NOT DETECTED NOT DETECTED Final  Cryptosporidium NOT DETECTED NOT DETECTED Final   Cyclospora cayetanensis NOT DETECTED NOT DETECTED Final   Entamoeba histolytica NOT DETECTED NOT DETECTED Final   Giardia lamblia NOT DETECTED NOT DETECTED Final   Adenovirus F40/41 NOT DETECTED NOT DETECTED Final   Astrovirus NOT DETECTED NOT DETECTED Final   Norovirus GI/GII NOT DETECTED NOT DETECTED Final   Rotavirus A NOT DETECTED NOT DETECTED Final   Sapovirus (I, II, IV, and V) NOT DETECTED NOT DETECTED Final     Comment: Performed at [facility], [address] [place], [place] [address]  C difficile quick scan w PCR reflex     Status: None   Collection Time: 04/12/19  3:36 AM   Specimen: Stool  Result Value Ref Range Status   C Diff antigen NEGATIVE NEGATIVE Final   C Diff toxin NEGATIVE NEGATIVE Final   C Diff interpretation No C. difficile detected.  Final    Comment: Performed at [facility], [place] [address] [place], [place] [address]     Labs: BNP (last 3 results) Recent Labs    05/04/18 0237 04/11/19 1539  BNP 36.1 34.2   Basic Metabolic Panel: Recent Labs  Lab 04/11/19 1538 04/12/19 0600 04/12/19 0910 04/12/19 1330 04/12/19 1840  NA 129* 135 134* 139 139  K 4.0 3.9 3.9 3.7 4.0  CL 97* 100 104 105 105  CO2 20* 22 24 25 26   GLUCOSE 591* 321* 362* 204* 220*  BUN 39* 33* 31* 28* 26*  CREATININE 2.03* 1.92* 1.78* 1.69* 1.64*  CALCIUM 7.5* 8.4* 7.8* 8.1* 8.1*  MG  --   --  1.9 1.9 1.9  PHOS  --   --  2.4* 2.7 2.5   Liver Function Tests: Recent Labs  Lab 04/11/19 1538  AST 15  ALT 11  ALKPHOS 72  BILITOT 0.7  PROT 7.0  ALBUMIN 3.2*   No results for input(s): LIPASE, AMYLASE in the last 168 hours. No results for input(s): AMMONIA in the last 168 hours. CBC: Recent Labs  Lab 04/11/19 1538 04/12/19 0600  WBC 5.3 6.3  HGB 11.2* 11.2*  HCT 35.2* 34.2*  MCV 81.7 79.7*  PLT 178 269   Cardiac Enzymes: No results for input(s): CKTOTAL, CKMB, CKMBINDEX, TROPONINI in the last 168 hours. BNP: Invalid input(s): POCBNP CBG: Recent Labs  Lab 04/12/19 2308 04/13/19 0337 04/13/19 0926 04/13/19 1130 04/13/19 1559  GLUCAP 194* 117* 128* 263* 239*   D-Dimer Recent Labs    04/12/19 0600 04/13/19 0348  DDIMER 2.41* 1.47*   Hgb A1c No results for input(s): HGBA1C in the last 72 hours. Lipid Profile No results for input(s): CHOL, HDL, LDLCALC, TRIG, CHOLHDL, LDLDIRECT in the last 72 hours. Thyroid function studies No results  for input(s): TSH, T4TOTAL, T3FREE, THYROIDAB in the last 72 hours.  Invalid input(s): FREET3 Anemia work up Recent Labs    04/12/19 0600 04/13/19 0348  FERRITIN 125 87   Urinalysis    Component Value Date/Time   COLORURINE STRAW (A) 03/19/2019 1446   APPEARANCEUR CLEAR 03/19/2019 1446   LABSPEC 1.022 03/19/2019 1446   PHURINE 6.0 03/19/2019 1446   GLUCOSEU >=500 (A) 03/19/2019 1446   HGBUR SMALL (A) 03/19/2019 1446   BILIRUBINUR NEGATIVE 03/19/2019 1446   KETONESUR NEGATIVE 03/19/2019 1446   PROTEINUR 100 (A) 03/19/2019 1446   UROBILINOGEN 0.2 08/13/2015 1030   NITRITE NEGATIVE 03/19/2019 1446   LEUKOCYTESUR NEGATIVE 03/19/2019 1446   Sepsis Labs Invalid input(s): PROCALCITONIN,  WBC,  LACTICIDVEN Microbiology Recent Results (  from the past 240 hour(s))  SARS Coronavirus 2 (CEPHEID- Performed in [place] hospital lab), Hosp Order     Status: Abnormal   Collection Time: 04/11/19  3:40 PM   Specimen: Nasopharyngeal Swab  Result Value Ref Range Status   SARS Coronavirus 2 POSITIVE (A) NEGATIVE Final    Comment: RESULT CALLED TO, READ BACK BY AND VERIFIED WITH: SIMPSON AT 1756ON 04/11/2019 BY JPM (NOTE) If result is NEGATIVE SARS-CoV-2 target nucleic acids are NOT DETECTED. The SARS-CoV-2 RNA is generally detectable in upper and lower  respiratory specimens during the acute phase of infection. The lowest  concentration of SARS-CoV-2 viral copies this assay can detect is 250  copies / mL. A negative result does not preclude SARS-CoV-2 infection  and should not be used as the sole basis for treatment or other  patient management decisions.  A negative result may occur with  improper specimen collection / handling, submission of specimen other  than nasopharyngeal swab, presence of viral mutation(s) within the  areas targeted by this assay, and inadequate number of viral copies  (<250 copies / mL). A negative result must be combined with clinical  observations, patient  history, and epidemiological information. If result is POSITIVE SARS-CoV-2 target nucleic acids are DETECTED. The  SARS-CoV-2 RNA is generally detectable in upper and lower  respiratory specimens during the acute phase of infection.  Positive  results are indicative of active infection with SARS-CoV-2.  Clinical  correlation with patient history and other diagnostic information is  necessary to determine patient infection status.  Positive results do  not rule out bacterial infection or co-infection with other viruses. If result is PRESUMPTIVE POSTIVE SARS-CoV-2 nucleic acids MAY BE PRESENT.   A presumptive positive result was obtained on the submitted specimen  and confirmed on repeat testing.  While 2019 novel coronavirus  (SARS-CoV-2) nucleic acids may be present in the submitted sample  additional confirmatory testing may be necessary for epidemiological  and / or clinical management purposes  to differentiate between  SARS-CoV-2 and other Sarbecovirus currently known to infect humans.  If clinically indicated additional testing with an alternate test  methodology [phone]) is a dvised. The SARS-CoV-2 RNA is generally  detectable in upper and lower respiratory specimens during the acute  phase of infection. The expected result is Negative. Fact Sheet for Patients:  StrictlyIdeas.no Fact Sheet for Healthcare Providers: BankingDealers.co.za This test is not yet approved or cleared by the [place] FDA and has been authorized for detection and/or diagnosis of SARS-CoV-2 by FDA under an Emergency Use Authorization (EUA).  This EUA will remain in effect (meaning this test can be used) for the duration of the COVID-19 declaration under Section 564(b)(1) of the Act, 21 U.S.C. section 360bbb-3(b)(1), unless the authorization is terminated or revoked sooner. Performed at [facility], [place] [address] [place],  [place] [address]   Gastrointestinal Panel by PCR , Stool     Status: None   Collection Time: 04/12/19  3:36 AM   Specimen: Stool  Result Value Ref Range Status   Campylobacter species NOT DETECTED NOT DETECTED Final   Plesimonas shigelloides NOT DETECTED NOT DETECTED Final   Salmonella species NOT DETECTED NOT DETECTED Final   Yersinia enterocolitica NOT DETECTED NOT DETECTED Final   Vibrio species NOT DETECTED NOT DETECTED Final   Vibrio cholerae NOT DETECTED NOT DETECTED Final   Enteroaggregative E coli (EAEC) NOT DETECTED NOT DETECTED Final   Enteropathogenic E coli (EPEC) NOT DETECTED NOT DETECTED Final  Enterotoxigenic E coli (ETEC) NOT DETECTED NOT DETECTED Final   Shiga like toxin producing E coli (STEC) NOT DETECTED NOT DETECTED Final   Shigella/Enteroinvasive E coli (EIEC) NOT DETECTED NOT DETECTED Final   Cryptosporidium NOT DETECTED NOT DETECTED Final   Cyclospora cayetanensis NOT DETECTED NOT DETECTED Final   Entamoeba histolytica NOT DETECTED NOT DETECTED Final   Giardia lamblia NOT DETECTED NOT DETECTED Final   Adenovirus F40/41 NOT DETECTED NOT DETECTED Final   Astrovirus NOT DETECTED NOT DETECTED Final   Norovirus GI/GII NOT DETECTED NOT DETECTED Final   Rotavirus A NOT DETECTED NOT DETECTED Final   Sapovirus (I, II, IV, and V) NOT DETECTED NOT DETECTED Final    Comment: Performed at [facility], [address] [place], [place] [address]  C difficile quick scan w PCR reflex     Status: None   Collection Time: 04/12/19  3:36 AM   Specimen: Stool  Result Value Ref Range Status   C Diff antigen NEGATIVE NEGATIVE Final   C Diff toxin NEGATIVE NEGATIVE Final   C Diff interpretation No C. difficile detected.  Final    Comment: Performed at [facility], [place] [address] [place], [place] [address]   Time coordinating discharge: Over 30 minutes  SIGNED: Little Ishikawa, DO [facility] 04/13/2019, 5:22 PM Pager   If 7PM-7AM, please  contact night-coverage [email or web address] Password [email or web address]

## 2019-04-13 NOTE — Evaluation (Addendum)
Occupational Therapy Evaluation Patient Details Name: Michael [name] MRN: [id number] DOB: 03/28/1951 Today's Date: 04/13/2019    History of Present Illness 68 year old male with recent COVID-68 illness for which patient was admitted and discharged from the hospital about 2 weeks ago. Presenting with SOB, weakness, and fatigue. Repeat COVID-19 test was positive. Past medical history significant for pulmonary embolism, HTN, hyperlipidemia, HIV, gout, diabetes mellitus, depression, crack cocaine use, CAD, asthma and A-fib.    Clinical Impression   PTA, pt was living at "[facility]" for rehab and reports he used a [facility] for mobility and performed BADLs. Pt currently requiring Min A for ADLs and Min Guard A for functional mobility with RW. Pt presenting with poor balance, strength, and activity tolerance. Pt fatigues quickly during simple tasks and would have difficulty performing BADLs and functional mobility safely alone in a motel. Pt would benefit from further acute OT to facilitate safe dc. Recommend dc to SNF for further OT to optimize safety, independence with ADLs, and return to PLOF.     Follow Up Recommendations  SNF;Supervision/Assistance - 24 hour    Equipment Recommendations  3 in 1 bedside commode    Recommendations for Other Services PT consult     Precautions / Restrictions Precautions Precautions: Fall Restrictions Weight Bearing Restrictions: No      Mobility Bed Mobility Overal bed mobility: Needs Assistance Bed Mobility: Sidelying to Sit;Rolling Rolling: Independent Sidelying to sit: Supervision       General bed mobility comments: safety, slow to mobilize  Transfers Overall transfer level: Needs assistance Equipment used: Rolling walker (2 wheeled) Transfers: Sit to/from Stand Sit to Stand: Min guard         General transfer comment: safety to stand at [employer] Overall balance assessment: Needs assistance Sitting-balance support: No  upper extremity supported;Feet supported Sitting balance-Leahy Scale: Fair Sitting balance - Comments: min guarding to lean forward to pull up sock   Standing balance support: During functional activity;Bilateral upper extremity supported Standing balance-Leahy Scale: Poor Standing balance comment: relies on UE support                           ADL either performed or assessed with clinical judgement   ADL Overall ADL's : Needs assistance/impaired Eating/Feeding: Set up;Supervision/ safety;Sitting   Grooming: Min guard;Sitting   Upper Body Bathing: Min guard;Sitting   Lower Body Bathing: Sit to/from stand;Minimal assistance   Upper Body Dressing : Minimal assistance;Sitting Upper Body Dressing Details (indicate cue type and reason): Min A for donning second gown Lower Body Dressing: Minimal assistance;Sit to/from stand Lower Body Dressing Details (indicate cue type and reason): Pt able to bend forward to adjust socks. However, requires significant time and effort and fatigues quickly Toilet Transfer: Min guard;Ambulation;RW(simulated to recliner)           Functional mobility during ADLs: Min guard;Minimal assistance;Rolling walker General ADL Comments: Pt presenting with decreased activity tolerance, balance, strength, and functional use of hands. Pt fatigues quickly and noting decreased stability at BLEs with mobility as he fatigued     Vision Baseline Vision/History: Wears glasses Wears Glasses: Reading only Patient Visual Report: Diplopia Vision Assessment?: Vision impaired- to be further tested in functional context Additional Comments: Reports occasional diplopia (horizontal). Denies any at this time     Perception     Praxis      Pertinent Vitals/Pain Pain Assessment: No/denies pain     Hand Dominance Right  Extremity/Trunk Assessment Upper Extremity Assessment Upper Extremity Assessment: RUE deficits/detail;LUE deficits/detail RUE Deficits /  Details: Pt with decreased FM skills and poor grasp strength. Pt able to perform finger opposition with increased time and effort. Pt able to hold utensils when eating and noting decreased in-hand manipulation RUE Coordination: decreased fine motor;decreased gross motor LUE Deficits / Details: Pt with decreased FM skills and poor grasp strength. Pt able to perform finger opposition with increased time and effort. Pt able to hold utensils when eating and noting decreased in-hand manipulation. 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  LUE Coordination: decreased fine motor;decreased gross motor   Lower Extremity Assessment Lower Extremity Assessment: Defer to PT evaluation RLE Deficits / Details: grossly wfl RLE Sensation: history of peripheral neuropathy LLE Deficits / Details: reports numbness but able to appreciate touch, noted decreased swing, decreased /slow movement LLE Sensation: history of peripheral neuropathy   Cervical / Trunk Assessment Cervical / Trunk Assessment: Normal   Communication Communication Communication: No difficulties(speech is slow and soft)   Cognition Arousal/Alertness: Awake/alert Behavior During Therapy: WFL for tasks assessed/performed(sluggish) Overall Cognitive Status: Impaired/Different from baseline Area of Impairment: Orientation                 Orientation Level: Place;Time           Problem Solving: Slow processing General Comments: Requiring increasd time throughout   General Comments  VSS throughout on RA    Exercises     Shoulder Instructions      Home Living Family/patient expects to be discharged to:: Shelter/Homeless                                 Additional Comments: was in drug rehab, then going to a motel for quarantining      Prior Functioning/Environment Level of Independence: Independent with assistive device(s)        Comments: reports independent with mobility, using SPC for  mobility and independent with ADLs stating "I do my best"        OT Problem List: Decreased strength;Decreased range of motion;Decreased activity tolerance;Impaired balance (sitting and/or standing);Decreased knowledge of use of DME or AE;Decreased knowledge of precautions;Impaired UE functional use;Cardiopulmonary status limiting activity      OT Treatment/Interventions: Self-care/ADL training;Therapeutic exercise;Energy conservation;DME and/or AE instruction;Therapeutic activities;Patient/family education    OT Goals(Current goals can be found in the care plan section) Acute Rehab OT Goals Patient Stated Goal: none specific, agreed with mobilizing OT Goal Formulation: With patient Time For Goal Achievement: 04/27/19 Potential to Achieve Goals: Good  OT Frequency: Min 2X/week   Barriers to D/C:            Co-evaluation PT/OT/SLP Co-Evaluation/Treatment: Yes Reason for Co-Treatment: For patient/therapist safety;To address functional/ADL transfers PT goals addressed during session: Mobility/safety with mobility OT goals addressed during session: ADL's and self-care      AM-PAC OT "6 Clicks" Daily Activity     Outcome Measure Help from another person eating meals?: None Help from another person taking care of personal grooming?: None Help from another person toileting, which includes using toliet, bedpan, or urinal?: A Little Help from another person bathing (including washing, rinsing, drying)?: A Little Help from another person to put on and taking off regular upper body clothing?: A Little Help from another person to put on and taking off regular lower body clothing?: A Little 6 Click Score: 20   End  of Session Equipment Utilized During Treatment: Gait belt;Rolling walker Nurse Communication: Mobility status  Activity Tolerance: Patient tolerated treatment well Patient left: in chair;with call bell/phone within reach;with chair alarm set  OT Visit Diagnosis: Unsteadiness  on feet (R26.81);Other abnormalities of gait and mobility (R26.89);Muscle weakness (generalized) (M62.81)                Time: 4734-0370 OT Time Calculation (min): 17 min Charges:  OT General Charges $OT Visit: 1 Visit OT Evaluation $OT Eval Moderate Complexity: [place], OTR/L Acute Rehab Pager: [phone] Office: [place] 04/13/2019, 12:41 PM

## 2019-04-13 NOTE — Progress Notes (Signed)
Inpatient Diabetes Program Recommendations  AACE/ADA: New Consensus Statement on Inpatient Glycemic Control (2015)  Target Ranges:  Prepandial:   less than 140 mg/dL      Peak postprandial:   less than 180 mg/dL (1-2 hours)      Critically ill patients:  140 - 180 mg/dL   Lab Results  Component Value Date   GLUCAP 263 (H) 04/13/2019   HGBA1C 12.6 (H) 03/19/2019    Review of Glycemic Control  Diabetes history: DM2 Outpatient Diabetes medications: Lantus 30 units + Novolog 4 units tid meal coverage + Metformin 1 gm bid. Current orders for Inpatient glycemic control:  Lantus 30 units + Novolog moderate correction tid + hs 0-5  Inpatient Diabetes Program Recommendations:   -Add Novolog 4 units tid meal coverage if eats 50%  Thank you, Bethena Roys E. Kealan Buchan, RN, MSN, CDE  Diabetes Coordinator Inpatient Glycemic Control Team Team Pager [phone] (8am-5pm) 04/13/2019 1:36 PM

## 2019-04-13 NOTE — Evaluation (Signed)
Physical Therapy Evaluation Patient Details Name: Michael [name] MRN: [id number] DOB: 1951-07-16 Today's Date: 04/13/2019   History of Present Illness  68 year old male with recent COVID-19 illness for which patient was admitted and discharged from the hospital about 2 weeks ago. Presenting with SOB, weakness, and fatigue. Repeat COVID-19 test was positive. Past medical history significant for pulmonary embolism, HTN, hyperlipidemia, HIV, gout, diabetes mellitus, depression, crack cocaine use, CAD, asthma and A-fib.   Clinical Impression  The patient presents with decreased balance, relies on a RW for stability. Patient moves and speaks very slowly. Patient is at risk for fall. Patient would be challenged to take care of himself in all aspects of ADL's and mobility alone in a motel. Pt admitted with above diagnosis. Pt currently with functional limitations due to the deficits listed below (see PT Problem List).  Pt will benefit from skilled PT to increase their independence and safety with mobility to allow discharge to the venue listed below.       Follow Up Recommendations SNF;Supervision/Assistance - 24 hour    Equipment Recommendations  Rolling walker with 5" wheels    Recommendations for Other Services       Precautions / Restrictions Precautions Precautions: Fall      Mobility  Bed Mobility Overal bed mobility: Needs Assistance Bed Mobility: Sidelying to Sit;Rolling Rolling: Independent Sidelying to sit: Supervision       General bed mobility comments: safety, slow to mobilize  Transfers Overall transfer level: Needs assistance Equipment used: Rolling walker (2 wheeled) Transfers: Sit to/from Stand Sit to Stand: Min guard         General transfer comment: safety to stand at [employer]  Ambulation/Gait Ambulation/Gait assistance: Min assist Gait Distance (Feet): 30 Feet Assistive device: Rolling walker (2 wheeled) Gait Pattern/deviations: Step-to  pattern;Step-through pattern;Decreased step length - left;Shuffle   Gait velocity interpretation: <1.8 ft/sec, indicate of risk for recurrent falls General Gait Details: very slow speed , decreased swing of L leg.  Stairs            Wheelchair Mobility    Modified Rankin (Stroke Patients Only)       Balance Overall balance assessment: Needs assistance Sitting-balance support: No upper extremity supported;Feet supported Sitting balance-Leahy Scale: Fair Sitting balance - Comments: min guarding to lean forward to pull up sock   Standing balance support: During functional activity;Bilateral upper extremity supported Standing balance-Leahy Scale: Poor Standing balance comment: relies on UE support                             Pertinent Vitals/Pain Pain Assessment: No/denies pain    Home Living Family/patient expects to be discharged to:: Shelter/Homeless                 Additional Comments: was in drug rehab, then going to a motel for quarantining    Prior Function Level of Independence: Independent with assistive device(s)         Comments: reports independent with mobility, using SPC for mobility and independent with ADLs      Hand Dominance   Dominant Hand: Right    Extremity/Trunk Assessment   Upper Extremity Assessment Upper Extremity Assessment: RUE deficits/detail;LUE deficits/detail RUE Deficits / Details: Pt with decreased FM skills and poor grasp strength. Pt able to perform finger opposition with increased time and effort. Pt able to hold utensils when eating and noting decreased in-hand manipulation RUE Coordination: decreased fine motor;decreased gross motor LUE  Deficits / Details: Pt with decreased FM skills and poor grasp strength. Pt able to perform finger opposition with increased time and effort. Pt able to hold utensils when eating and noting decreased in-hand manipulation. PT reports that his prior CVA impacted his left hand  more.  LUE Coordination: decreased fine motor;decreased gross motor    Lower Extremity Assessment Lower Extremity Assessment: Generalized weakness;RLE deficits/detail;LLE deficits/detail RLE Deficits / Details: grossly wfl RLE Sensation: history of peripheral neuropathy LLE Deficits / Details: reports numbness but able to appreciate touch, noted decreased swing, decreased /slow movement LLE Sensation: history of peripheral neuropathy    Cervical / Trunk Assessment Cervical / Trunk Assessment: Normal  Communication   Communication: No difficulties(speech is slow)  Cognition Arousal/Alertness: Awake/alert Behavior During Therapy: WFL for tasks assessed/performed(sluggish) Overall Cognitive Status: Impaired/Different from baseline Area of Impairment: Orientation                 Orientation Level: Place;Time           Problem Solving: Slow processing General Comments: Requiring increasd time throughout      General Comments      Exercises     Assessment/Plan    PT Assessment Patient needs continued PT services  PT Problem List Decreased strength;Decreased activity tolerance;Decreased balance;Decreased knowledge of use of DME;Decreased cognition;Decreased mobility;Decreased coordination;Decreased safety awareness;Decreased knowledge of precautions       PT Treatment Interventions DME instruction;Therapeutic activities;Cognitive remediation;Gait training;Therapeutic exercise;Patient/family education;Balance training;Functional mobility training    PT Goals (Current goals can be found in the Care Plan section)  Acute Rehab PT Goals Patient Stated Goal: none specific, agreed with mobilizing PT Goal Formulation: With patient Time For Goal Achievement: 04/27/19 Potential to Achieve Goals: Good    Frequency Min 3X/week   Barriers to discharge Decreased caregiver support      Co-evaluation PT/OT/SLP Co-Evaluation/Treatment: Yes Reason for Co-Treatment: For  patient/therapist safety PT goals addressed during session: Mobility/safety with mobility OT goals addressed during session: ADL's and self-care       AM-PAC PT "6 Clicks" Mobility  Outcome Measure Help needed turning from your back to your side while in a flat bed without using bedrails?: A Little Help needed moving from lying on your back to sitting on the side of a flat bed without using bedrails?: A Little Help needed moving to and from a bed to a chair (including a wheelchair)?: A Lot Help needed standing up from a chair using your arms (e.g., wheelchair or bedside chair)?: A Lot Help needed to walk in hospital room?: A Lot Help needed climbing 3-5 steps with a railing? : Total 6 Click Score: 13    End of Session Equipment Utilized During Treatment: Gait belt Activity Tolerance: Patient tolerated treatment well Patient left: in chair;with call bell/phone within reach;with chair alarm set Nurse Communication: Mobility status PT Visit Diagnosis: Unsteadiness on feet (R26.81);Difficulty in walking, not elsewhere classified (R26.2)    Time: 0277-4128 PT Time Calculation (min) (ACUTE ONLY): 30 min   Charges:   PT Evaluation $PT Eval Low Complexity: 1 Low          [phone]  Claretha Cooper 04/13/2019, 9:11 AM

## 2019-04-14 LAB — FERRITIN: Ferritin: 70 ng/mL (ref 24–336)

## 2019-04-14 LAB — RAPID URINE DRUG SCREEN, HOSP PERFORMED
Amphetamines: NOT DETECTED
Barbiturates: NOT DETECTED
Benzodiazepines: NOT DETECTED
Cocaine: POSITIVE — AB
Opiates: NOT DETECTED
Tetrahydrocannabinol: NOT DETECTED

## 2019-04-14 LAB — GLUCOSE, CAPILLARY
Glucose-Capillary: 218 mg/dL — ABNORMAL HIGH (ref 70–99)
Glucose-Capillary: 213 mg/dL — ABNORMAL HIGH (ref 70–99)
Glucose-Capillary: 213 mg/dL — ABNORMAL HIGH (ref 70–99)
Glucose-Capillary: 226 mg/dL — ABNORMAL HIGH (ref 70–99)
Glucose-Capillary: 346 mg/dL — ABNORMAL HIGH (ref 70–99)

## 2019-04-14 LAB — INTERLEUKIN-6, PLASMA
Interleukin-6, Plasma: 10.8 pg/mL (ref 0.0–12.2)
Interleukin-6, Plasma: 16.7 pg/mL — ABNORMAL HIGH (ref 0.0–12.2)

## 2019-04-14 LAB — C-REACTIVE PROTEIN: CRP: 0.9 mg/dL (ref <1.0)

## 2019-04-14 LAB — D-DIMER, QUANTITATIVE: D-Dimer, Quant: 0.97 ug/mL-FEU — ABNORMAL HIGH (ref 0.00–0.50)

## 2019-04-14 MED ORDER — PNEUMOCOCCAL VAC POLYVALENT 25 MCG/0.5ML IJ INJ: 0.5000 mL | INJECTION | INTRAMUSCULAR | Status: DC

## 2019-04-14 MED ORDER — SODIUM CHLORIDE 0.9 % IV SOLN
12.5000 mg | Freq: Three times a day (TID) | INTRAVENOUS | Status: DC | PRN
  Administered 2019-04-14: 12.5 mg via INTRAVENOUS
  Filled 2019-04-14 (×2): qty 0.5

## 2019-04-14 MED ORDER — GUAIFENESIN-DM 100-10 MG/5ML PO SYRP
5.0000 mL | ORAL_SOLUTION | ORAL | Status: DC | PRN
  Administered 2019-04-14 – 2019-04-21 (×10): 5 mL via ORAL
  Filled 2019-04-14 (×10): qty 10

## 2019-04-14 MED ORDER — ALUM & MAG HYDROXIDE-SIMETH 200-200-20 MG/5ML PO SUSP
30.0000 mL | Freq: Four times a day (QID) | ORAL | Status: DC | PRN
  Administered 2019-04-14 – 2019-04-15 (×3): 30 mL via ORAL
  Filled 2019-04-14 (×3): qty 30

## 2019-04-14 NOTE — Care Management Important Message (Signed)
Important Message  Patient Details  Name: Michael [name] MRN: [id number] Date of Birth: 10-29-1950   Medicare Important Message Given:  Yes - Important Message mailed due to current National Emergency   Verbal consent obtained due to current National Emergency  Relationship to patient: Self Contact Name: [name] Call Date: 04/14/19  Time: 1323 Phone: [phone] Outcome: No Answer/Busy Important Message mailed to: Patient address on file    [name] 04/14/2019, 1:24 PM

## 2019-04-14 NOTE — Progress Notes (Signed)
Pt is requesting medication for hiccups. He is having indigestion so I ordered Maalox .Marland KitchenNotifed MD

## 2019-04-14 NOTE — Progress Notes (Signed)
                                  PROGRESS NOTE                                                                                                                                                                                                             Patient Demographics:    Michael [name], is a 67 y.o. male, DOB - 04/20/1951, MRN:[id number]  Outpatient Primary MD for the patient is Gomez, Roger David, PA-C    LOS - 3  Admit date - 04/11/2019    Chief Complaint  Patient presents with  . Shortness of Breath       Brief Narrative  Patient is a 67-year-old African American male with past medical history significant for recent COVID-19 illness for which patient was admitted and discharged from the hospital about 2 weeks ago, pulmonary embolism, hypertension, hyperlipidemia, HIV, gout, diabetes mellitus, depression, crack cocaine use, coronary artery disease, asthma and atrial fibrillation.Patient presents with new onset of shortness of breath, weakness and fatigue that started about 3 days ago. Patient also reports pain light headache, dizzy, with associated nausea, vomiting and diarrhea. Repeat COVID-19 test was positive and he was admitted for further care.    Subjective:    Michael [name] today has, No headache, No chest pain, No abdominal pain - No Nausea, No new weakness tingling or numbness, No Cough - SOB.     Assessment  & Plan :    Principal Problem:   Respiratory distress Active Problems:   Uncontrolled type 2 diabetes with neuropathy ([name])   Asthma, chronic   Chronic kidney disease, stage III (moderate) ([name])    Acute Covid 19 Viral Pneumonitis during the ongoing 2020 Covid 19 Pandemic - no hypoxia or oxygen need, stable inflammatory markers, conservative management with supportive care only.    COVID-19 Labs  Recent Labs    04/12/19 0600 04/13/19 0348 04/14/19 0448  DDIMER 2.41* 1.47* 0.97*  FERRITIN 125 87 70  CRP 1.1* 0.9 0.9     Lab Results  Component Value Date   SARSCOV2NAA POSITIVE (A) 04/11/2019   SARSCOV2NAA POSITIVE (A) 03/19/2019     Hepatic Function Latest Ref Rng & Units 04/11/2019 03/21/2019 03/20/2019  Total Protein 6.5 - 8.1 g/dL 7.0 6.6 7.2  Albumin 3.5 - 5.0 g/dL 3.2(L) 3.1(L) 3.5  AST 15 - 41 U/L 15 19 16  ALT 0 - 44 U/L 11   14 13  Alk Phosphatase 38 - 126 U/L 72 60 68  Total Bilirubin 0.3 - 1.2 mg/dL 0.7 0.3 0.4  Bilirubin, Direct 0.0 - 0.2 mg/dL - - -        Component Value Date/Time   BNP 66.1 04/11/2019 1539      2.  Poorly controlled DM type II without DKA.  Patient compliance with medications on Lantus and sliding scale.  Extremely poor outpatient control due to hyperglycemia.  Will provide diabetic and insulin education here prior to discharge.  Lab Results  Component Value Date   HGBA1C 12.6 (H) 03/19/2019   CBG (last 3)  Recent Labs    04/13/19 2010 04/14/19 0908 04/14/19 1118  GLUCAP 273* 218* 213*    3.  HIV.  Under the care of Dr. Hatcher Home medications continued.  4.  AKI on CKD stage III.  After hydration back to baseline.  5.  Generalized weakness and deconditioning.  Needs placement.  Social worker to assist.     Condition - Fair  Family Communication  :  None  Code Status :  Full  Diet :   Diet Order            Diet heart healthy/carb modified Room service appropriate? Yes; Fluid consistency: Thin  Diet effective now        Diet - low sodium heart healthy               Disposition Plan  :  SNF  Consults  :  None  Procedures  :    CT head.  Nonacute.  CT chest. 1. Technically adequate exam showing no acute pulmonary embolus. 2. Ascending thoracic aneurysm, 3.2 centimeters. Recommend annual imaging followup by CTA or MRA. This recommendation follows 2010 ACCF/AHA/AATS/ACR/ASA/SCA/SCAI/SIR/STS/SVM Guidelines for the Diagnosis and Management of Patients with Thoracic Aortic Disease. Circulation.2010; 121: E266-e369. Aortic aneurysm NOS  (ICD10-I71.9) 3. Interval development of peripheral irregular ground-glass opacities primarily within the UPPER lobes. 4. Esophageal wall thickening, nonspecific but possibly related to esophagitis.   PUD Prophylaxis :   DVT Prophylaxis  :   Heparin    Lab Results  Component Value Date   PLT 269 04/12/2019    Inpatient Medications  Scheduled Meds: . aspirin  81 mg Oral Daily  . emtricitabine-rilpivir-tenofovir AF  1 tablet Oral Q breakfast  . fluticasone  1 puff Inhalation BID  . heparin  5,000 Units Subcutaneous Q8H  . insulin aspart  0-15 Units Subcutaneous TID WC  . insulin aspart  0-5 Units Subcutaneous QHS  . insulin glargine  30 Units Subcutaneous QHS  . ondansetron  4 mg Intravenous Once   Continuous Infusions: . sodium chloride     PRN Meds:.Ipratropium-Albuterol  Antibiotics  :    Anti-infectives (From admission, onward)   Start     Dose/Rate Route Frequency Ordered Stop   04/13/19 0800  emtricitabine-rilpivir-tenofovir AF (ODEFSEY) 200-25-25 MG per tablet 1 tablet     1 tablet Oral Daily with breakfast 04/12/19 0335         Time Spent in minutes  30     M.D on 04/14/2019 at 12:12 PM  To page go to [email or web address] - password [email or web address]  [facility] -  Office  [phone]     See all Orders from today for further details    Objective:   Vitals:   04/13/19 2331 04/14/19 0312 04/14/19 0843 04/14/19 0900  BP: 124/68 137/82 (!) 122/95   Pulse: 99 92 98 87  Resp:   17 (!) 24 (!) 23 16  Temp: 98.4 F (36.9 C) 98.9 F (37.2 C)  98.5 F (36.9 C)  TempSrc: Oral Oral  Oral  SpO2: 100% 100% 100% 100%  Weight:      Height:        Wt Readings from Last 3 Encounters:  04/11/19 74.4 kg  03/19/19 62.6 kg  12/16/18 74.4 kg     Intake/Output Summary (Last 24 hours) at 04/14/2019 1212 Last data filed at 04/14/2019 1117 Gross per 24 hour  Intake 720 ml  Output 1425 ml  Net -705 ml     Physical Exam  Awake Alert, Oriented X 3, No new F.N  deficits, Normal affect [name].AT,PERRAL Supple Neck,No JVD, No cervical lymphadenopathy appriciated.  Symmetrical Chest wall movement, Good air movement bilaterally, CTAB RRR,No Gallops,Rubs or new Murmurs, No Parasternal Heave +ve B.Sounds, Abd Soft, No tenderness, No organomegaly appriciated, No rebound - guarding or rigidity. No Cyanosis, Clubbing or edema, No new Rash or bruise      Data Review:    CBC Recent Labs  Lab 04/11/19 1538 04/12/19 0600  WBC 5.3 6.3  HGB 11.2* 11.2*  HCT 35.2* 34.2*  PLT 178 269  MCV 81.7 79.7*  MCH 26.0 26.1  MCHC 31.8 32.7  RDW 13.2 13.3    Chemistries  Recent Labs  Lab 04/11/19 1538 04/12/19 0600 04/12/19 0910 04/12/19 1330 04/12/19 1840  NA 129* 135 134* 139 139  K 4.0 3.9 3.9 3.7 4.0  CL 97* 100 104 105 105  CO2 20* _0 GLUCOSE 591* 321* 362* 204* 220*  BUN 39* 33* 31* 28* 26*  CREATININE 2.03* 1.92* 1.78* 1.69* 1.64*  CALCIUM 7.5* 8.4* 7.8* 8.1* 8.1*  MG  --   --  1.9 1.9 1.9  AST 15  --   --   --   --   ALT 11  --   --   --   --   ALKPHOS 72  --   --   --   --   BILITOT 0.7  --   --   --   --    ------------------------------------------------------------------------------------------------------------------ No results for input(s): CHOL, HDL, LDLCALC, TRIG, CHOLHDL, LDLDIRECT in the last 72 hours.  Lab Results  Component Value Date   HGBA1C 12.6 (H) 03/19/2019   ------------------------------------------------------------------------------------------------------------------ No results for input(s): TSH, T4TOTAL, T3FREE, THYROIDAB in the last 72 hours.  Invalid input(s): FREET3  Cardiac Enzymes No results for input(s): CKMB, TROPONINI, MYOGLOBIN in the last 168 hours.  Invalid input(s): CK ------------------------------------------------------------------------------------------------------------------    Component Value Date/Time   BNP 66.1 04/11/2019 1539    Micro Results Recent Results (from the  past 240 hour(s))  SARS Coronavirus 2 (CEPHEID- Performed in [place] hospital lab), Hosp Order     Status: Abnormal   Collection Time: 04/11/19  3:40 PM   Specimen: Nasopharyngeal Swab  Result Value Ref Range Status   SARS Coronavirus 2 POSITIVE (A) NEGATIVE Final    Comment: RESULT CALLED TO, READ BACK BY AND VERIFIED WITH: SIMPSON AT 1756ON 04/11/2019 BY JPM (NOTE) If result is NEGATIVE SARS-CoV-2 target nucleic acids are NOT DETECTED. The SARS-CoV-2 RNA is generally detectable in upper and lower  respiratory specimens during the acute phase of infection. The lowest  concentration of SARS-CoV-2 viral copies this assay can detect is 250  copies / mL. A negative result does not preclude SARS-CoV-2 infection  and should not be used as the sole basis for treatment or other  patient management decisions.  A negative result may occur with  improper specimen collection / handling, submission of specimen other  than nasopharyngeal swab, presence of viral mutation(s) within the  areas targeted by this assay, and inadequate number of viral copies  (<250 copies / mL). A negative result must be combined with clinical  observations, patient history, and epidemiological information. If result is POSITIVE SARS-CoV-2 target nucleic acids are DETECTED. The  SARS-CoV-2 RNA is generally detectable in upper and lower  respiratory specimens during the acute phase of infection.  Positive  results are indicative of active infection with SARS-CoV-2.  Clinical  correlation with patient history and other diagnostic information is  necessary to determine patient infection status.  Positive results do  not rule out bacterial infection or co-infection with other viruses. If result is PRESUMPTIVE POSTIVE SARS-CoV-2 nucleic acids MAY BE PRESENT.   A presumptive positive result was obtained on the submitted specimen  and confirmed on repeat testing.  While 2019 novel coronavirus  (SARS-CoV-2) nucleic acids  may be present in the submitted sample  additional confirmatory testing may be necessary for epidemiological  and / or clinical management purposes  to differentiate between  SARS-CoV-2 and other Sarbecovirus currently known to infect humans.  If clinically indicated additional testing with an alternate test  methodology [phone]) is a dvised. The SARS-CoV-2 RNA is generally  detectable in upper and lower respiratory specimens during the acute  phase of infection. The expected result is Negative. Fact Sheet for Patients:  StrictlyIdeas.no Fact Sheet for Healthcare Providers: BankingDealers.co.za This test is not yet approved or cleared by the [place] FDA and has been authorized for detection and/or diagnosis of SARS-CoV-2 by FDA under an Emergency Use Authorization (EUA).  This EUA will remain in effect (meaning this test can be used) for the duration of the COVID-19 declaration under Section 564(b)(1) of the Act, 21 U.S.C. section 360bbb-3(b)(1), unless the authorization is terminated or revoked sooner. Performed at [facility], [place] [address] [place], [place] [address]   Gastrointestinal Panel by PCR , Stool     Status: None   Collection Time: 04/12/19  3:36 AM   Specimen: Stool  Result Value Ref Range Status   Campylobacter species NOT DETECTED NOT DETECTED Final   Plesimonas shigelloides NOT DETECTED NOT DETECTED Final   Salmonella species NOT DETECTED NOT DETECTED Final   Yersinia enterocolitica NOT DETECTED NOT DETECTED Final   Vibrio species NOT DETECTED NOT DETECTED Final   Vibrio cholerae NOT DETECTED NOT DETECTED Final   Enteroaggregative E coli (EAEC) NOT DETECTED NOT DETECTED Final   Enteropathogenic E coli (EPEC) NOT DETECTED NOT DETECTED Final   Enterotoxigenic E coli (ETEC) NOT DETECTED NOT DETECTED Final   Shiga like toxin producing E coli (STEC) NOT DETECTED NOT DETECTED Final    Shigella/Enteroinvasive E coli (EIEC) NOT DETECTED NOT DETECTED Final   Cryptosporidium NOT DETECTED NOT DETECTED Final   Cyclospora cayetanensis NOT DETECTED NOT DETECTED Final   Entamoeba histolytica NOT DETECTED NOT DETECTED Final   Giardia lamblia NOT DETECTED NOT DETECTED Final   Adenovirus F40/41 NOT DETECTED NOT DETECTED Final   Astrovirus NOT DETECTED NOT DETECTED Final   Norovirus GI/GII NOT DETECTED NOT DETECTED Final   Rotavirus A NOT DETECTED NOT DETECTED Final   Sapovirus (I, II, IV, and V) NOT DETECTED NOT DETECTED Final    Comment: Performed at [facility], [address] [place], [place] [address]  C difficile quick scan w PCR reflex  Status: None   Collection Time: 04/12/19  3:36 AM   Specimen: Stool  Result Value Ref Range Status   C Diff antigen NEGATIVE NEGATIVE Final   C Diff toxin NEGATIVE NEGATIVE Final   C Diff interpretation No C. difficile detected.  Final    Comment: Performed at [facility], [address] [place], [place] [address]    Radiology Reports Ct Head Wo Contrast  Result Date: 04/11/2019 CLINICAL DATA:  Patient with headache for 3-4 days. Recently recovered from COVID. EXAM: CT HEAD WITHOUT CONTRAST TECHNIQUE: Contiguous axial images were obtained from the base of the skull through the vertex without intravenous contrast. COMPARISON:  Brain CT 09/02/2018 FINDINGS: Brain: Ventricles and sulci are prominent compatible with atrophy. Periventricular and subcortical white matter hypodensity compatible with chronic microvascular ischemic changes. No evidence for acute cortically based infarct, intracranial hemorrhage, mass lesion or mass-effect. Vascular: Unremarkable Skull: Intact. Sinuses/Orbits: Paranasal sinuses are well aerated. Mastoid air cells are unremarkable. Orbits are unremarkable. Other: None. IMPRESSION: No acute intracranial process. Atrophy and chronic microvascular ischemic changes. Electronically Signed   By:  Drew  Davis M.D.   On: 04/11/2019 16:40   Ct Angio Chest Pe W Or Wo Contrast  Result Date: 04/12/2019 CLINICAL DATA:  Pt states that he has been short of breath and can't catch his breath Patient discharged form [facility] on 6/12, repeat covid test + history of PE EXAM: CT ANGIOGRAPHY CHEST WITH CONTRAST TECHNIQUE: Multidetector CT imaging of the chest was performed using the standard protocol during bolus administration of intravenous contrast. Multiplanar CT image reconstructions and MIPs were obtained to evaluate the vascular anatomy. CONTRAST:  80mL OMNIPAQUE IOHEXOL 350 MG/ML SOLN COMPARISON:  03/19/2019 FINDINGS: Cardiovascular: The pulmonary arteries are well opacified and there is no acute pulmonary embolus. Status post CABG. Note is made of bovine arch anatomy. Aorta is tortuous but not aneurysmal. There is minimal atherosclerotic calcification of the thoracic arch. Ascending aorta is 3.2 centimeters. Mediastinum/Nodes: There is Conference will thickening of the thoracic esophagus throughout its course, without mass. No mediastinal, axillary, or hilar adenopathy. The visualized portion of the thyroid gland has a normal appearance. Lungs/Pleura: There has been interval development of peripheral irregular ground-glass opacities primarily within the UPPER lobes but also involving the LOWER lobes. There is stable RIGHT LOWER lobe atelectasis or scarring. Mild bronchial wall thickening. No pleural effusions or frank consolidations. Upper Abdomen: No acute abnormality. Musculoskeletal: Median sternotomy. Midthoracic spondylosis. Review of the MIP images confirms the above findings. IMPRESSION: 1. Technically adequate exam showing no acute pulmonary embolus. 2. Ascending thoracic aneurysm, 3.2 centimeters. Recommend annual imaging followup by CTA or MRA. This recommendation follows 2010 ACCF/AHA/AATS/ACR/ASA/SCA/SCAI/SIR/STS/SVM Guidelines for the Diagnosis and Management of Patients with Thoracic Aortic Disease.  Circulation.2010; 121: E266-e369. Aortic aneurysm NOS (ICD10-I71.9) 3. Interval development of peripheral irregular ground-glass opacities primarily within the UPPER lobes. 4. Esophageal wall thickening, nonspecific but possibly related to esophagitis. Electronically Signed   By: Elizabeth  [place] M.D.   On: 04/12/2019 10:09      Dg Chest Portable 1 View  Result Date: 04/11/2019 CLINICAL DATA:  Shortness of breath. EXAM: PORTABLE CHEST 1 VIEW COMPARISON:  Chest radiograph 03/19/2019 FINDINGS: Monitoring leads overlie the patient. Patient status post median sternotomy. Normal cardiac and mediastinal contours. No consolidative pulmonary opacities. No pleural effusion or pneumothorax. IMPRESSION: No acute cardiopulmonary process. Electronically Signed   By: Drew  Davis M.D.   On: 04/11/2019 15:54       

## 2019-04-14 NOTE — Progress Notes (Signed)
Inpatient Diabetes Program Recommendations  AACE/ADA: New Consensus Statement on Inpatient Glycemic Control (2015)  Target Ranges:  Prepandial:   less than 140 mg/dL      Peak postprandial:   less than 180 mg/dL (1-2 hours)      Critically ill patients:  140 - 180 mg/dL   Lab Results  Component Value Date   GLUCAP 213 (H) 04/14/2019   HGBA1C 12.6 (H) 03/19/2019    Review of Glycemic Control  Diabetes history:DM2 Outpatient Diabetes medications:Lantus 30 units + Novolog 4 units tid meal coverage + Metformin 1 gm bid. Current orders for Inpatient glycemic control:Lantus 30 units + Novolog moderate correction tid + hs 0-5  Inpatient Diabetes Program Recommendations:   -Add Novolog 4 units tid meal coverage if eats 50%  Thank you, Bethena Roys E. Marien Manship, RN, MSN, CDE  Diabetes Coordinator Inpatient Glycemic Control Team Team Pager [phone] (8am-5pm) 04/14/2019 1:33 PM

## 2019-04-14 NOTE — Care Management Important Message (Deleted)
Important Message  Patient Details  Name: Michael [name] MRN: [id number] Date of Birth: [birth year]   Medicare Important Message Given:  Yes - Important Message mailed due to current National Emergency   Verbal consent obtained due to current National Emergency  Relationship to patient: Spouse/Significant Other Contact Name: Ignatius Specking Call Date: 04/14/19  Time: 1329 Phone: [phone] Outcome: Spoke with contact Important Message mailed to: Patient address on file     [name] 04/14/2019, 1:30 PM

## 2019-04-14 NOTE — Progress Notes (Signed)
Physical Therapy Evaluation Patient Details Name: Michael [name] MRN: [id number] DOB: Dec 06, 1950 Today's Date: 04/14/2019   History of Present Illness  68 year old male with recent COVID-20 illness for which patient was admitted and discharged from the hospital about 2 weeks ago. Presenting with SOB, weakness, and fatigue. Repeat COVID-19 test was positive. Past medical history significant for pulmonary embolism, HTN, hyperlipidemia, HIV, gout, diabetes mellitus, depression, crack cocaine use, CAD, asthma and A-fib.   Clinical Impression  The  Patient is noted to have hiccups and complains of difficulty swallowing, that food does not go down and  Painful in midsternal area.  Rn notified. Patient is noted to mobilize to sitting without assistance, Requires steady assistance and RW to stand and  Ambulate x 40', gait is shuffling, slow and  With decreased LLE swing through. Patient relies on the RW for support. Continue assessing safe functional mobility.    Follow Up Recommendations SNF;Supervision/Assistance - 24 hour    Equipment Recommendations  Rolling walker with 5" wheels    Recommendations for Other Services       Precautions / Restrictions Precautions Precautions: Fall      Mobility  Bed Mobility Overal bed mobility: Needs Assistance   Rolling: Independent         General bed mobility comments: safety, slow to mobilize  Transfers   Equipment used: Rolling walker (2 wheeled) Transfers: Sit to/from Stand Sit to Stand: Min guard         General transfer comment: safety to stand at [employer]  Ambulation/Gait Ambulation/Gait assistance: Min assist Gait Distance (Feet): 40 Feet Assistive device: Rolling walker (2 wheeled) Gait Pattern/deviations: Step-to pattern;Step-through pattern;Trunk flexed;Shuffle;Decreased step length - left     General Gait Details: speed is sloww, slight improved LLE swing through, does require UE support. Limited by hiccups and epigastric  pain and nausea  Stairs            Wheelchair Mobility    Modified Rankin (Stroke Patients Only)       Balance Overall balance assessment: Needs assistance Sitting-balance support: No upper extremity supported;Feet supported Sitting balance-Leahy Scale: Fair     Standing balance support: During functional activity;Bilateral upper extremity supported Standing balance-Leahy Scale: Fair Standing balance comment: relies on UE support                             Pertinent Vitals/Pain Pain Assessment: Faces Faces Pain Scale: Hurts whole lot Pain Location: midchest" I can't swallow and when I dio it hurts" Pain Descriptors / Indicators: Sharp;Discomfort;Grimacing Pain Intervention(s): Limited activity within patient's tolerance;Monitored during session(RN notified)    Home Living                        Prior Function                 Hand Dominance        Extremity/Trunk Assessment                Communication      Cognition Arousal/Alertness: Awake/alert Behavior During Therapy: WFL for tasks assessed/performed                                 Problem Solving: Slow processing General Comments: Requiring increasd time throughout, did ask for phone number to [facility] and noted to dial and was talking to someone.  General Comments      Exercises     Assessment/Plan    PT Assessment    PT Problem List         PT Treatment Interventions      PT Goals (Current goals can be found in the Care Plan section)       Frequency Min 3X/week   Barriers to discharge        Co-evaluation               AM-PAC PT "6 Clicks" Mobility  Outcome Measure Help needed turning from your back to your side while in a flat bed without using bedrails?: None Help needed moving from lying on your back to sitting on the side of a flat bed without using bedrails?: None Help needed moving to and from a bed to a  chair (including a wheelchair)?: A Little Help needed standing up from a chair using your arms (e.g., wheelchair or bedside chair)?: A Lot Help needed to walk in hospital room?: A Lot Help needed climbing 3-5 steps with a railing? : Total 6 Click Score: 16    End of Session Equipment Utilized During Treatment: Gait belt Activity Tolerance: Patient limited by pain Patient left: in chair;with call bell/phone within reach;with chair alarm set Nurse Communication: Mobility status(pain) PT Visit Diagnosis: Unsteadiness on feet (R26.81);Difficulty in walking, not elsewhere classified (R26.2);Pain    Time: 1425-1456 PT Time Calculation (min) (ACUTE ONLY): 31 min   Charges:     PT Treatments $Gait Training: 23-37 mins        [place] [phone] Office [phone]   Claretha Cooper 04/14/2019, 4:11 PM

## 2019-04-15 ENCOUNTER — Encounter: Payer: Medicare Other | Admitting: Infectious Diseases

## 2019-04-15 LAB — GLUCOSE, CAPILLARY
Glucose-Capillary: 318 mg/dL — ABNORMAL HIGH (ref 70–99)
Glucose-Capillary: 188 mg/dL — ABNORMAL HIGH (ref 70–99)
Glucose-Capillary: 235 mg/dL — ABNORMAL HIGH (ref 70–99)
Glucose-Capillary: 203 mg/dL — ABNORMAL HIGH (ref 70–99)
Glucose-Capillary: 294 mg/dL — ABNORMAL HIGH (ref 70–99)

## 2019-04-15 LAB — D-DIMER, QUANTITATIVE: D-Dimer, Quant: 1.43 ug/mL-FEU — ABNORMAL HIGH (ref 0.00–0.50)

## 2019-04-15 LAB — FERRITIN: Ferritin: 67 ng/mL (ref 24–336)

## 2019-04-15 LAB — INTERLEUKIN-6, PLASMA: Interleukin-6, Plasma: 13.5 pg/mL — ABNORMAL HIGH (ref 0.0–12.2)

## 2019-04-15 LAB — C-REACTIVE PROTEIN: CRP: <0.8 mg/dL (ref <1.0)

## 2019-04-15 MED ORDER — PANTOPRAZOLE SODIUM 40 MG PO TBEC: 40.0000 mg | DELAYED_RELEASE_TABLET | Freq: Two times a day (BID) | ORAL | Status: DC

## 2019-04-15 MED ORDER — NYSTATIN 100000 UNIT/ML MT SUSP
5.0000 mL | Freq: Four times a day (QID) | OROMUCOSAL | Status: AC
  Administered 2019-04-15 – 2019-04-19 (×18): 500000 [IU] via ORAL
  Filled 2019-04-15 (×20): qty 5

## 2019-04-15 MED ORDER — INSULIN GLARGINE 100 UNIT/ML ~~LOC~~ SOLN
20.0000 [IU] | Freq: Two times a day (BID) | SUBCUTANEOUS | Status: DC
  Administered 2019-04-15 – 2019-04-21 (×13): 20 [IU] via SUBCUTANEOUS
  Filled 2019-04-15 (×14): qty 0.2

## 2019-04-15 MED ORDER — FAMOTIDINE 20 MG PO TABS
20.0000 mg | ORAL_TABLET | Freq: Two times a day (BID) | ORAL | Status: DC
  Administered 2019-04-15 – 2019-04-21 (×13): 20 mg via ORAL
  Filled 2019-04-15 (×14): qty 1

## 2019-04-15 NOTE — Progress Notes (Signed)
Physical Therapy Treatment Patient Details Name: Michael [name] MRN: [id number] DOB: 1950-11-27 Today's Date: 04/15/2019    History of Present Illness 68 year old male with recent COVID-35 illness for which patient was admitted and discharged from the hospital about 2 weeks ago. Presenting with SOB, weakness, and fatigue. Repeat COVID-19 test was positive. Past medical history significant for pulmonary embolism, HTN, hyperlipidemia, HIV, gout, diabetes mellitus, depression, crack cocaine use, CAD, asthma and A-fib.     PT Comments    The patient continues to demonstrate decreased balance without use of RW. Patient reports epigastric pain and hiccups. Patient ambulated x 10' without RW and gait very slow and unsteady. Continue PT   Follow Up Recommendations  SNF;Supervision/Assistance - 24 hour     Equipment Recommendations  Rolling walker with 5" wheels    Recommendations for Other Services       Precautions / Restrictions Precautions Precautions: Fall    Mobility  Bed Mobility Overal bed mobility: Independent                Transfers Overall transfer level: Needs assistance Equipment used: Rolling walker (2 wheeled) Transfers: Sit to/from Stand Sit to Stand: Min guard         General transfer comment: safety to stand at [facility]  Ambulation/Gait Ambulation/Gait assistance: Min assist Gait Distance (Feet): 80 Feet Assistive device: Rolling walker (2 wheeled) Gait Pattern/deviations: Drifts right/left;Trunk flexed;Step-to pattern;Decreased stance time - left;Decreased step length - left     General Gait Details: speed is sloww, slight improved LLE swing through, does require UE support.  Did ambulate x 10' without Rw and was unsteady and requird at least 1 HHA and speed of gait was even slower.   Stairs             Wheelchair Mobility    Modified Rankin (Stroke Patients Only)       Balance   Sitting-balance support: No upper extremity  supported;Feet supported Sitting balance-Leahy Scale: Fair     Standing balance support: During functional activity;Bilateral upper extremity supported Standing balance-Leahy Scale: Fair Standing balance comment: relies on UE support                            Cognition Arousal/Alertness: Awake/alert Behavior During Therapy: WFL for tasks assessed/performed;Flat affect                                   General Comments: improved MS, still slow responses      Exercises      General Comments        Pertinent Vitals/Pain Faces Pain Scale: Hurts whole lot Pain Location: midchest" I can't swallow and when I dio it hurts" Pain Descriptors / Indicators: Discomfort;Grimacing Pain Intervention(s): Monitored during session    Home Living                      Prior Function            PT Goals (current goals can now be found in the care plan section) Progress towards PT goals: Progressing toward goals    Frequency    Min 3X/week      PT Plan Current plan remains appropriate    Co-evaluation              AM-PAC PT "6 Clicks" Mobility   Outcome Measure  Help needed  turning from your back to your side while in a flat bed without using bedrails?: None Help needed moving from lying on your back to sitting on the side of a flat bed without using bedrails?: None Help needed moving to and from a bed to a chair (including a wheelchair)?: A Little Help needed standing up from a chair using your arms (e.g., wheelchair or bedside chair)?: A Little Help needed to walk in hospital room?: A Lot Help needed climbing 3-5 steps with a railing? : Total 6 Click Score: 17    End of Session Equipment Utilized During Treatment: Gait belt Activity Tolerance: Patient limited by pain;Patient limited by fatigue Patient left: in bed;with call bell/phone within reach Nurse Communication: Mobility status PT Visit Diagnosis: Unsteadiness on feet  (R26.81);Difficulty in walking, not elsewhere classified (R26.2);Pain     Time: 7322-0254 PT Time Calculation (min) (ACUTE ONLY): 17 min  Charges:  $Gait Training: 8-22 mins                     [place] Pager [phone] Office [phone]    Claretha Cooper 04/15/2019, 4:52 PM

## 2019-04-15 NOTE — Progress Notes (Addendum)
                                  PROGRESS NOTE                                                                                                                                                                                                             Patient Demographics:    Michael [name], is a 67 y.o. male, DOB - 11/09/1950, MRN:[id number]  Outpatient Primary MD for the patient is Gomez, Roger David, PA-C    LOS - 4  Admit date - 04/11/2019    Chief Complaint  Patient presents with  . Shortness of Breath       Brief Narrative  Patient is a 67-year-old African American male with past medical history significant for recent COVID-19 illness for which patient was admitted and discharged from the hospital about 2 weeks ago, pulmonary embolism, hypertension, hyperlipidemia, HIV, gout, diabetes mellitus, depression, crack cocaine use, coronary artery disease, asthma and atrial fibrillation.Patient presents with new onset of shortness of breath, weakness and fatigue that started about 3 days ago. Patient also reports pain light headache, dizzy, with associated nausea, vomiting and diarrhea. Repeat COVID-19 test was positive and he was admitted for further care.    Subjective:   Patient in bed, appears comfortable, denies any headache, no fever, no chest pain or pressure, no shortness of breath , no abdominal pain. No focal weakness. +ve heart burn.    Assessment  & Plan :    1.  Acute Covid 19 Viral Pneumonitis during the ongoing 2020 Covid 19 Pandemic - no hypoxia or oxygen need, stable inflammatory markers, conservative management with supportive care only.    COVID-19 Labs  Recent Labs    04/13/19 0348 04/14/19 0448 04/15/19 0300  DDIMER 1.47* 0.97* 1.43*  FERRITIN 87 70 67  CRP 0.9 0.9 <0.8    Lab Results  Component Value Date   SARSCOV2NAA POSITIVE (A) 04/11/2019   SARSCOV2NAA POSITIVE (A) 03/19/2019     Hepatic Function Latest Ref Rng &  Units 04/11/2019 03/21/2019 03/20/2019  Total Protein 6.5 - 8.1 g/dL 7.0 6.6 7.2  Albumin 3.5 - 5.0 g/dL 3.2(L) 3.1(L) 3.5  AST 15 - 41 U/L 15 19 16  ALT 0 - 44 U/L 11 14 13  Alk Phosphatase 38 - 126 U/L 72 60 68  Total Bilirubin 0.3 - 1.2 mg/dL 0.7 0.3 0.4  Bilirubin, Direct 0.0 - 0.2 mg/dL - - -          Component Value Date/Time   BNP 66.1 04/11/2019 1539      2.  Poorly controlled DM type II without DKA.  Patient compliance with medications on Lantus and sliding scale.  Extremely poor outpatient control due to hyperglycemia.  Will provide diabetic and insulin education here prior to discharge.  Lab Results  Component Value Date   HGBA1C 12.6 (H) 03/19/2019   CBG (last 3)  Recent Labs    04/14/19 1753 04/14/19 2221 04/15/19 0848  GLUCAP 226* 346* 188*    3.  HIV.  Under the care of Dr. Hatcher Home medications continued.  4.  AKI on CKD stage III.  After hydration back to baseline.  5.  Generalized weakness and deconditioning.  Needs placement.  Social worker to assist.  6.  Esophagitis.  Placed on nystatin along with PPI.  Outpatient GI follow-up and EGD.  7.  Intermittent hiccups likely due to #6 above.  PRN Thorazine along with treatment as stated in #6 above.  8.  Ongoing cocaine abuse.  Counseled to quit.    Condition - Fair  Family Communication  :  None  Code Status :  Full  Diet :   Diet Order            Diet heart healthy/carb modified Room service appropriate? Yes; Fluid consistency: Thin  Diet effective now        Diet - low sodium heart healthy               Disposition Plan  :  SNF versus hotel.  Discussed with social work on 04/15/2019.  Consults  :  None  Procedures  :    CT head.  Nonacute.  CT chest. 1. Technically adequate exam showing no acute pulmonary embolus. 2. Ascending thoracic aneurysm, 3.2 centimeters. Recommend annual imaging followup by CTA or MRA. This recommendation follows 2010  ACCF/AHA/AATS/ACR/ASA/SCA/SCAI/SIR/STS/SVM Guidelines for the Diagnosis and Management of Patients with Thoracic Aortic Disease. Circulation.2010; 121: E266-e369. Aortic aneurysm NOS (ICD10-I71.9) 3. Interval development of peripheral irregular ground-glass opacities primarily within the UPPER lobes. 4. Esophageal wall thickening, nonspecific but possibly related to esophagitis.   PUD Prophylaxis :   DVT Prophylaxis  :   Heparin    Lab Results  Component Value Date   PLT 269 04/12/2019    Inpatient Medications  Scheduled Meds: . aspirin  81 mg Oral Daily  . emtricitabine-rilpivir-tenofovir AF  1 tablet Oral Q breakfast  . fluticasone  1 puff Inhalation BID  . heparin  5,000 Units Subcutaneous Q8H  . insulin aspart  0-15 Units Subcutaneous TID WC  . insulin aspart  0-5 Units Subcutaneous QHS  . insulin glargine  20 Units Subcutaneous BID  . nystatin  5 mL Oral QID  . ondansetron  4 mg Intravenous Once  . pantoprazole  40 mg Oral BID   Continuous Infusions: . chlorproMAZINE (THORAZINE) IV Stopped (04/14/19 1634)   PRN Meds:.alum & mag hydroxide-simeth, chlorproMAZINE (THORAZINE) IV, guaiFENesin-dextromethorphan, Ipratropium-Albuterol  Antibiotics  :    Anti-infectives (From admission, onward)   Start     Dose/Rate Route Frequency Ordered Stop   04/13/19 0800  emtricitabine-rilpivir-tenofovir AF (ODEFSEY) 200-25-25 MG per tablet 1 tablet     1 tablet Oral Daily with breakfast 04/12/19 0335         Time Spent in minutes  30     M.D on 04/15/2019 at 9:16 AM  To page go to [email or web address] - password [email or web address]  [facility] -  Office  [phone]       See all Orders from today for further details    Objective:   Vitals:   04/14/19 0900 04/14/19 1617 04/14/19 2017 04/15/19 0441  BP:  139/81 124/75 134/88  Pulse: 87 90 98 94  Resp: 16 18 17 18  Temp: 98.5 F (36.9 C) 99.1 F (37.3 C) 98.3 F (36.8 C) 98.5 F (36.9 C)  TempSrc: Oral Oral Oral Oral   SpO2: 100% 99% 99% 99%  Weight:      Height:        Wt Readings from Last 3 Encounters:  04/11/19 74.4 kg  03/19/19 62.6 kg  12/16/18 74.4 kg     Intake/Output Summary (Last 24 hours) at 04/15/2019 0916 Last data filed at 04/15/2019 0700 Gross per 24 hour  Intake 627.21 ml  Output 1525 ml  Net -897.79 ml     Physical Exam  Awake Alert, Oriented X 3, No new F.N deficits, Normal affect [place].AT,PERRAL Supple Neck,No JVD, No cervical lymphadenopathy appriciated.  Symmetrical Chest wall movement, Good air movement bilaterally, CTAB RRR,No Gallops, Rubs or new Murmurs, No Parasternal Heave +ve B.Sounds, Abd Soft, No tenderness, No organomegaly appriciated, No rebound - guarding or rigidity. No Cyanosis, Clubbing or edema, No new Rash or bruise   Data Review:    CBC Recent Labs  Lab 04/11/19 1538 04/12/19 0600  WBC 5.3 6.3  HGB 11.2* 11.2*  HCT 35.2* 34.2*  PLT 178 269  MCV 81.7 79.7*  MCH 26.0 26.1  MCHC 31.8 32.7  RDW 13.2 13.3    Chemistries  Recent Labs  Lab 04/11/19 1538 04/12/19 0600 04/12/19 0910 04/12/19 1330 04/12/19 1840  NA 129* 135 134* 139 139  K 4.0 3.9 3.9 3.7 4.0  CL 97* 100 104 105 105  CO2 20* 22 24 25 26  GLUCOSE 591* 321* 362* 204* 220*  BUN 39* 33* 31* 28* 26*  CREATININE 2.03* 1.92* 1.78* 1.69* 1.64*  CALCIUM 7.5* 8.4* 7.8* 8.1* 8.1*  MG  --   --  1.9 1.9 1.9  AST 15  --   --   --   --   ALT 11  --   --   --   --   ALKPHOS 72  --   --   --   --   BILITOT 0.7  --   --   --   --    ------------------------------------------------------------------------------------------------------------------ No results for input(s): CHOL, HDL, LDLCALC, TRIG, CHOLHDL, LDLDIRECT in the last 72 hours.  Lab Results  Component Value Date   HGBA1C 12.6 (H) 03/19/2019   ------------------------------------------------------------------------------------------------------------------ No results for input(s): TSH, T4TOTAL, T3FREE, THYROIDAB in the last  72 hours.  Invalid input(s): FREET3  Cardiac Enzymes No results for input(s): CKMB, TROPONINI, MYOGLOBIN in the last 168 hours.  Invalid input(s): CK ------------------------------------------------------------------------------------------------------------------    Component Value Date/Time   BNP 66.1 04/11/2019 1539    Micro Results Recent Results (from the past 240 hour(s))  SARS Coronavirus 2 (CEPHEID- Performed in [place] hospital lab), Hosp Order     Status: Abnormal   Collection Time: 04/11/19  3:40 PM   Specimen: Nasopharyngeal Swab  Result Value Ref Range Status   SARS Coronavirus 2 POSITIVE (A) NEGATIVE Final    Comment: RESULT CALLED TO, READ BACK BY AND VERIFIED WITH: SIMPSON AT 1756ON 04/11/2019 BY JPM (NOTE) If result is NEGATIVE SARS-CoV-2 target nucleic acids are NOT DETECTED. The SARS-CoV-2 RNA is generally detectable in upper and lower  respiratory specimens during the acute phase of infection. The lowest    concentration of SARS-CoV-2 viral copies this assay can detect is 250  copies / mL. A negative result does not preclude SARS-CoV-2 infection  and should not be used as the sole basis for treatment or other  patient management decisions.  A negative result may occur with  improper specimen collection / handling, submission of specimen other  than nasopharyngeal swab, presence of viral mutation(s) within the  areas targeted by this assay, and inadequate number of viral copies  (<250 copies / mL). A negative result must be combined with clinical  observations, patient history, and epidemiological information. If result is POSITIVE SARS-CoV-2 target nucleic acids are DETECTED. The  SARS-CoV-2 RNA is generally detectable in upper and lower  respiratory specimens during the acute phase of infection.  Positive  results are indicative of active infection with SARS-CoV-2.  Clinical  correlation with patient history and other diagnostic information is   necessary to determine patient infection status.  Positive results do  not rule out bacterial infection or co-infection with other viruses. If result is PRESUMPTIVE POSTIVE SARS-CoV-2 nucleic acids MAY BE PRESENT.   A presumptive positive result was obtained on the submitted specimen  and confirmed on repeat testing.  While 2019 novel coronavirus  (SARS-CoV-2) nucleic acids may be present in the submitted sample  additional confirmatory testing may be necessary for epidemiological  and / or clinical management purposes  to differentiate between  SARS-CoV-2 and other Sarbecovirus currently known to infect humans.  If clinically indicated additional testing with an alternate test  methodology ([id number]) is a dvised. The SARS-CoV-2 RNA is generally  detectable in upper and lower respiratory specimens during the acute  phase of infection. The expected result is Negative. Fact Sheet for Patients:  https://www.fda.gov/media/136312/download Fact Sheet for Healthcare Providers: https://www.fda.gov/media/136313/download This test is not yet approved or cleared by the [place] FDA and has been authorized for detection and/or diagnosis of SARS-CoV-2 by FDA under an Emergency Use Authorization (EUA).  This EUA will remain in effect (meaning this test can be used) for the duration of the COVID-19 declaration under Section 564(b)(1) of the Act, 21 U.S.C. section 360bbb-3(b)(1), unless the authorization is terminated or revoked sooner. Performed at  [facility], [address] [place], [place] [address]   Gastrointestinal Panel by PCR , Stool     Status: None   Collection Time: 04/12/19  3:36 AM   Specimen: Stool  Result Value Ref Range Status   Campylobacter species NOT DETECTED NOT DETECTED Final   Plesimonas shigelloides NOT DETECTED NOT DETECTED Final   Salmonella species NOT DETECTED NOT DETECTED Final   Yersinia enterocolitica NOT DETECTED NOT DETECTED Final    Vibrio species NOT DETECTED NOT DETECTED Final   Vibrio cholerae NOT DETECTED NOT DETECTED Final   Enteroaggregative E coli (EAEC) NOT DETECTED NOT DETECTED Final   Enteropathogenic E coli (EPEC) NOT DETECTED NOT DETECTED Final   Enterotoxigenic E coli (ETEC) NOT DETECTED NOT DETECTED Final   Shiga like toxin producing E coli (STEC) NOT DETECTED NOT DETECTED Final   Shigella/Enteroinvasive E coli (EIEC) NOT DETECTED NOT DETECTED Final   Cryptosporidium NOT DETECTED NOT DETECTED Final   Cyclospora cayetanensis NOT DETECTED NOT DETECTED Final   Entamoeba histolytica NOT DETECTED NOT DETECTED Final   Giardia lamblia NOT DETECTED NOT DETECTED Final   Adenovirus F40/41 NOT DETECTED NOT DETECTED Final   Astrovirus NOT DETECTED NOT DETECTED Final   Norovirus GI/GII NOT DETECTED NOT DETECTED Final   Rotavirus A NOT DETECTED NOT DETECTED   Final   Sapovirus (I, II, IV, and V) NOT DETECTED NOT DETECTED Final    Comment: Performed at [facility], [address] [place], [place] [address]  C difficile quick scan w PCR reflex     Status: None   Collection Time: 04/12/19  3:36 AM   Specimen: Stool  Result Value Ref Range Status   C Diff antigen NEGATIVE NEGATIVE Final   C Diff toxin NEGATIVE NEGATIVE Final   C Diff interpretation No C. difficile detected.  Final    Comment: Performed at [facility], [place] [address], [place], [place] [address]    Radiology Reports Ct Head Wo Contrast  Result Date: 04/11/2019 CLINICAL DATA:  Patient with headache for 3-4 days. Recently recovered from [place]. EXAM: CT HEAD WITHOUT CONTRAST TECHNIQUE: Contiguous axial images were obtained from the base of the skull through the vertex without intravenous contrast. COMPARISON:  Brain CT 09/02/2018 FINDINGS: Brain: Ventricles and sulci are prominent compatible with atrophy. Periventricular and subcortical white matter hypodensity compatible with chronic microvascular ischemic changes. No evidence  for acute cortically based infarct, intracranial hemorrhage, mass lesion or mass-effect. Vascular: Unremarkable Skull: Intact. Sinuses/Orbits: Paranasal sinuses are well aerated. Mastoid air cells are unremarkable. Orbits are unremarkable. Other: None. IMPRESSION: No acute intracranial process. Atrophy and chronic microvascular ischemic changes. Electronically Signed   By: Lovey Newcomer M.D.   On: 04/11/2019 16:40   Ct Angio Chest Pe W Or Wo Contrast  Result Date: 04/12/2019 CLINICAL DATA:  Pt states that he has been short of breath and can't catch his breath Patient discharged form [facility] on 6/12, repeat covid test + history of PE EXAM: CT ANGIOGRAPHY CHEST WITH CONTRAST TECHNIQUE: Multidetector CT imaging of the chest was performed using the standard protocol during bolus administration of intravenous contrast. Multiplanar CT image reconstructions and MIPs were obtained to evaluate the vascular anatomy. CONTRAST:  52m OMNIPAQUE IOHEXOL 350 MG/ML SOLN COMPARISON:  03/19/2019 FINDINGS: Cardiovascular: The pulmonary arteries are well opacified and there is no acute pulmonary embolus. Status post CABG. Note is made of bovine arch anatomy. Aorta is tortuous but not aneurysmal. There is minimal atherosclerotic calcification of the thoracic arch. Ascending aorta is 3.2 centimeters. Mediastinum/Nodes: There is Conference will thickening of the thoracic esophagus throughout its course, without mass. No mediastinal, axillary, or hilar adenopathy. The visualized portion of the thyroid gland has a normal appearance. Lungs/Pleura: There has been interval development of peripheral irregular ground-glass opacities primarily within the UPPER lobes but also involving the LOWER lobes. There is stable RIGHT LOWER lobe atelectasis or scarring. Mild bronchial wall thickening. No pleural effusions or frank consolidations. Upper Abdomen: No acute abnormality. Musculoskeletal: Median sternotomy. Midthoracic spondylosis. Review of the MIP  images confirms the above findings. IMPRESSION: 1. Technically adequate exam showing no acute pulmonary embolus. 2. Ascending thoracic aneurysm, 3.2 centimeters. Recommend annual imaging followup by CTA or MRA. This recommendation follows 2010 ACCF/AHA/AATS/ACR/ASA/SCA/SCAI/SIR/STS/SVM Guidelines for the Diagnosis and Management of Patients with Thoracic Aortic Disease. Circulation.2010; 121:: J941-D408 Aortic aneurysm NOS (ICD10-I71.9) 3. Interval development of peripheral irregular ground-glass opacities primarily within the UPPER lobes. 4. Esophageal wall thickening, nonspecific but possibly related to esophagitis. Electronically Signed   By: ENolon NationsM.D.   On: 04/12/2019 10:09      Dg Chest Portable 1 View  Result Date: 04/11/2019 CLINICAL DATA:  Shortness of breath. EXAM: PORTABLE CHEST 1 VIEW COMPARISON:  Chest radiograph 03/19/2019 FINDINGS: Monitoring leads overlie the patient. Patient status post median sternotomy. Normal cardiac  and mediastinal contours. No consolidative pulmonary opacities. No pleural effusion or pneumothorax. IMPRESSION: No acute cardiopulmonary process. Electronically Signed   By: Lovey Newcomer M.D.   On: 04/11/2019 15:54

## 2019-04-15 NOTE — Progress Notes (Signed)
Was notified that HBG A1C needed to be recollected. Notified lab x2 to inform... no answer

## 2019-04-15 NOTE — Progress Notes (Addendum)
Pt reports epigastric pain midchest states "I can't swallow and when I dio it hurts".feels like food gets stuck.  notified Dr. Candiss Norse.. see new orders

## 2019-04-16 LAB — CBC WITH DIFFERENTIAL/PLATELET
Abs Immature Granulocytes: 0.03 10*3/uL (ref 0.00–0.07)
Basophils Absolute: 0 10*3/uL (ref 0.0–0.1)
Basophils Relative: 0 %
Eosinophils Absolute: 0.1 10*3/uL (ref 0.0–0.5)
Eosinophils Relative: 1 %
HCT: 29.4 % — ABNORMAL LOW (ref 39.0–52.0)
Hemoglobin: 9.2 g/dL — ABNORMAL LOW (ref 13.0–17.0)
Immature Granulocytes: 1 %
Lymphocytes Relative: 32 %
Lymphs Abs: 1.1 10*3/uL (ref 0.7–4.0)
MCH: 25.8 pg — ABNORMAL LOW (ref 26.0–34.0)
MCHC: 31.3 g/dL (ref 30.0–36.0)
MCV: 82.4 fL (ref 80.0–100.0)
Monocytes Absolute: 0.4 10*3/uL (ref 0.1–1.0)
Monocytes Relative: 11 %
Neutro Abs: 1.9 10*3/uL (ref 1.7–7.7)
Neutrophils Relative %: 55 %
Platelets: 165 10*3/uL (ref 150–400)
RBC: 3.57 MIL/uL — ABNORMAL LOW (ref 4.22–5.81)
RDW: 13.2 % (ref 11.5–15.5)
WBC: 3.5 10*3/uL — ABNORMAL LOW (ref 4.0–10.5)
nRBC: 0 % (ref 0.0–0.2)

## 2019-04-16 LAB — GLUCOSE, CAPILLARY
Glucose-Capillary: 124 mg/dL — ABNORMAL HIGH (ref 70–99)
Glucose-Capillary: 241 mg/dL — ABNORMAL HIGH (ref 70–99)
Glucose-Capillary: 255 mg/dL — ABNORMAL HIGH (ref 70–99)
Glucose-Capillary: 255 mg/dL — ABNORMAL HIGH (ref 70–99)

## 2019-04-16 LAB — COMPREHENSIVE METABOLIC PANEL
ALT: 11 U/L (ref 0–44)
AST: 14 U/L — ABNORMAL LOW (ref 15–41)
Albumin: 2.8 g/dL — ABNORMAL LOW (ref 3.5–5.0)
Alkaline Phosphatase: 52 U/L (ref 38–126)
Anion gap: 8 (ref 5–15)
BUN: 19 mg/dL (ref 8–23)
CO2: 25 mmol/L (ref 22–32)
Calcium: 8.5 mg/dL — ABNORMAL LOW (ref 8.9–10.3)
Chloride: 106 mmol/L (ref 98–111)
Creatinine, Ser: 1.42 mg/dL — ABNORMAL HIGH (ref 0.61–1.24)
GFR calc Af Amer: 58 mL/min — ABNORMAL LOW (ref >60)
GFR calc non Af Amer: 50 mL/min — ABNORMAL LOW (ref >60)
Glucose, Bld: 199 mg/dL — ABNORMAL HIGH (ref 70–99)
Potassium: 4.1 mmol/L (ref 3.5–5.1)
Sodium: 139 mmol/L (ref 135–145)
Total Bilirubin: 0.1 mg/dL — ABNORMAL LOW (ref 0.3–1.2)
Total Protein: 6.1 g/dL — ABNORMAL LOW (ref 6.5–8.1)

## 2019-04-16 LAB — BRAIN NATRIURETIC PEPTIDE: B Natriuretic Peptide: 54.2 pg/mL (ref 0.0–100.0)

## 2019-04-16 LAB — MAGNESIUM: Magnesium: 1.8 mg/dL (ref 1.7–2.4)

## 2019-04-16 LAB — INTERLEUKIN-6, PLASMA: Interleukin-6, Plasma: 13.8 pg/mL — ABNORMAL HIGH (ref 0.0–12.2)

## 2019-04-16 LAB — C-REACTIVE PROTEIN: CRP: 0.8 mg/dL (ref <1.0)

## 2019-04-16 LAB — HEMOGLOBIN A1C
Hgb A1c MFr Bld: 13.2 % — ABNORMAL HIGH (ref 4.8–5.6)
Mean Plasma Glucose: 332 mg/dL

## 2019-04-16 MED ORDER — TRAZODONE HCL 50 MG PO TABS: 50.0000 mg | ORAL_TABLET | Freq: Every evening | ORAL | 0 refills | Status: DC | PRN

## 2019-04-16 MED ORDER — BLOOD GLUCOSE MONITOR KIT: PACK | 1 refills | Status: DC

## 2019-04-16 MED ORDER — INSULIN ASPART 100 UNIT/ML ~~LOC~~ SOLN: SUBCUTANEOUS | 0 refills | Status: DC

## 2019-04-16 MED ORDER — INSULIN GLARGINE 100 UNIT/ML ~~LOC~~ SOLN: 20.0000 [IU] | Freq: Every day | SUBCUTANEOUS | 0 refills | Status: DC

## 2019-04-16 MED ORDER — "INSULIN SYRINGE-NEEDLE U-100 25G X 1"" 1 ML MISC": 0 refills | Status: DC

## 2019-04-16 MED ORDER — NYSTATIN 100000 UNIT/ML MT SUSP: 5.0000 mL | Freq: Four times a day (QID) | OROMUCOSAL | 0 refills | Status: DC

## 2019-04-16 MED ORDER — FAMOTIDINE 20 MG PO TABS: 20.0000 mg | ORAL_TABLET | Freq: Two times a day (BID) | ORAL | 1 refills | Status: DC

## 2019-04-16 NOTE — Progress Notes (Signed)
Inpatient Diabetes Program Recommendations  AACE/ADA: New Consensus Statement on Inpatient Glycemic Control (2015)  Target Ranges:  Prepandial:   less than 140 mg/dL      Peak postprandial:   less than 180 mg/dL (1-2 hours)      Critically ill patients:  140 - 180 mg/dL   Lab Results  Component Value Date   GLUCAP 241 (H) 04/16/2019   HGBA1C 13.2 (H) 04/15/2019    Review of Glycemic Control Results for Michael [name], Michael [name] (MRN [id number]) as of 04/16/2019 13:47  Ref. Range 04/15/2019 12:03 04/15/2019 17:26 04/15/2019 21:31 04/16/2019 08:00 04/16/2019 11:01  Glucose-Capillary Latest Ref Range: 70 - 99 mg/dL 235 (H) 203 (H) 294 (H) 124 (H) 241 (H)   Diabetes history:DM2 Outpatient Diabetes medications:Lantus 30 units + Novolog 4 units tid meal coverage + Metformin 1 gm bid. Current orders for Inpatient glycemic control:Lantus 20 units BID + Novologmoderatecorrection tid + hs 0-5  Inpatient Diabetes Program Recommendations:  -Add Novolog 4 units tid meal coverage if eats 50%  Spoke with patient regarding outpatient DM management. Patient has not been checking blood sugars or taking insulin as prescribed.  Reviewed patient's current A1c of 13.2%. Explained what a A1c is and what it measures. Also reviewed goal A1c with patient, importance of good glucose control @ home, and blood sugar goals. Reviewed patho of DM, need for insuline, role of pancreas, impact of infection with poor glycemic control, vascular changes and comorbidites.  Patient has taken Lantus, he just needs a PCP to continue to follow him. Will place CM consult for assistance. Patient called number and struggled to make appointment.  Patient will need a meter at discharge. Blood glucose meter kit (includes lancets and strips) ([id number]). Encouraged to begin checking 2 times per day. Stressed the importance of taking medications as prescribed. Patient denies having issues with transportation or financial barriers so that he  could get his medications. Patient states, "I am just so overwhelmed by all the medications." Discussed goal setting and getting organized with medications as a first goal. Denies drinking sugary beverages and tries to be mindful of carb intake. Patient has no further questions at this time.    Thanks, Bronson Curb, MSN, RNC-OB Diabetes Coordinator [phone] (8a-5p)

## 2019-04-16 NOTE — Progress Notes (Signed)
Physical Therapy Treatment Patient Details Name: Michael [name] MRN: [id number] DOB: [birth year] Today's Date: 04/16/2019    History of Present Illness 68 year old male with recent COVID-62 illness for which patient was admitted and discharged from the hospital about 2 weeks ago. Presenting with SOB, weakness, and fatigue. Repeat COVID-19 test was positive. Past medical history significant for pulmonary embolism, HTN, hyperlipidemia, HIV, gout, diabetes mellitus, depression, crack cocaine use, CAD, asthma and A-fib.     PT Comments    Pt is progressing towards gait goals.  He continues to move slowly, but more steady on his feet today compared to previous sessions.  I issued and reviewed UE/LE HEP with him and instructed him to do the exercises TID.  VSS when spot checked during session with O2 at 100% on RA, HR in the 90s and BP 100s/70s.  PT will continue to follow acutely for safe mobility progression   Follow Up Recommendations  Home health PT;Supervision - Intermittent;Other (comment)(max HH services, PT, OT, aide, meals on wheels)     Equipment Recommendations  Rolling walker with 5" wheels;3in1 (PT);Other (comment)(tub transfer bench)    Recommendations for Other Services   NA     Precautions / Restrictions Precautions Precautions: Fall Precaution Comments: slow gait speed and AD use puts him at higher risk for falls    Mobility  Bed Mobility Overal bed mobility: Independent                Transfers Overall transfer level: Needs assistance Equipment used: Rolling walker (2 wheeled) Transfers: Sit to/from Stand Sit to Stand: Supervision         General transfer comment: supervision for safety  Ambulation/Gait Ambulation/Gait assistance: Supervision;Min guard Gait Distance (Feet): 55 Feet Assistive device: Rolling walker (2 wheeled) Gait Pattern/deviations: Step-through pattern;Staggering left;Staggering right Gait velocity: decreased Gait velocity  interpretation: <1.8 ft/sec, indicate of risk for recurrent falls General Gait Details: slow speed, mildy staggering gait pattern while turning.  Mostly supervision, min guard assist for turns and when he fatiged at the end of gait.           Balance Overall balance assessment: Needs assistance Sitting-balance support: Feet supported;No upper extremity supported Sitting balance-Leahy Scale: Fair     Standing balance support: Single extremity supported;Bilateral upper extremity supported Standing balance-Leahy Scale: Fair Standing balance comment: close supervision for static standing, needs external support from RW for dynamic balance                            Cognition Arousal/Alertness: Awake/alert Behavior During Therapy: WFL for tasks assessed/performed Overall Cognitive Status: Impaired/Different from baseline Area of Impairment: Problem solving                             Problem Solving: Slow processing General Comments: slow to move, slow to respond      Exercises General Exercises - Upper Extremity Shoulder Flexion: AROM;Both;10 reps;Theraband Theraband Level (Shoulder Flexion): Level 1 (Yellow) Shoulder ABduction: AROM;Both;10 reps;Theraband Theraband Level (Shoulder Abduction): Level 1 (Yellow) Elbow Flexion: AROM;Both;10 reps;Theraband Theraband Level (Elbow Flexion): Level 1 (Yellow) Elbow Extension: AROM;Both;10 reps;Theraband Theraband Level (Elbow Extension): Level 1 (Yellow) General Exercises - Lower Extremity Long Arc Quad: AROM;Both;10 reps;Other (comment)(2 second holds) Hip ABduction/ADduction: AROM;Both;10 reps Straight Leg Raises: AROM;Both;10 reps Hip Flexion/Marching: AROM;Both;10 reps        Pertinent Vitals/Pain Pain Assessment: Faces Faces Pain Scale: Hurts little more Pain  Location: chest Pain Descriptors / Indicators: Discomfort;Grimacing Pain Intervention(s): Limited activity within patient's  tolerance;Monitored during session;Repositioned       Prior Function            PT Goals (current goals can now be found in the care plan section) Acute Rehab PT Goals Patient Stated Goal: none specific, agreed with mobilizing Progress towards PT goals: Progressing toward goals    Frequency    Min 3X/week      PT Plan Current plan remains appropriate       AM-PAC PT "6 Clicks" Mobility   Outcome Measure  Help needed turning from your back to your side while in a flat bed without using bedrails?: None Help needed moving from lying on your back to sitting on the side of a flat bed without using bedrails?: None Help needed moving to and from a bed to a chair (including a wheelchair)?: None Help needed standing up from a chair using your arms (e.g., wheelchair or bedside chair)?: None Help needed to walk in hospital room?: A Little Help needed climbing 3-5 steps with a railing? : A Little 6 Click Score: 22    End of Session   Activity Tolerance: Patient limited by fatigue;Patient limited by pain Patient left: in chair;with call bell/phone within reach;with chair alarm set   PT Visit Diagnosis: Unsteadiness on feet (R26.81);Difficulty in walking, not elsewhere classified (R26.2);Pain     Time: 9563-8756 PT Time Calculation (min) (ACUTE ONLY): 46 min  Charges:  $Gait Training: 8-22 mins $Therapeutic Exercise: 8-22 mins $Therapeutic Activity: 8-22 mins                    Mesiah Manzo B. Fabian Walder, PT, DPT  Acute Rehabilitation [phone] pager [phone] office  @ [facility]: [phone]   04/16/2019, 10:23 AM

## 2019-04-16 NOTE — Progress Notes (Signed)
CSW has reached out to both [facility] today to identify a plan for patient. Both SNFs concerned if they accept patient he will have no discharge plan. CSW posed discharge plan from SNF to [place], and gave information regarding [facility] contact as far as who to contact after patient is ready to discharge to [facility].   Public Health Dept Director over [place] reports that if patient is not symptomatic for 3 days he would not qualify for hotel. [facility] and [facility] both continue to decline bed offer at this time as patient's discharge from SNF if offered a bed is still unsure at this time.   CSW will continue to potentially identify alternative plans. At this time, patient will need to continue to work with PT in hopes of becoming more independent to either discharge to [facility] from [facility], or test negative for COVID and discharge from [facility] to homeless shelter.   [place], [place]

## 2019-04-16 NOTE — NC FL2 (Signed)
[place] LEVEL OF CARE SCREENING TOOL     IDENTIFICATION  Patient Name: Michael [name] Birthdate: Oct 31, 1950 Sex: male Admission Date (Current Location): 04/11/2019  [facility] and [place] Number:  Herbalist and Address:  The . [facility], [place] [address] [place], [place] [address]      Provider Number: [id number]  Attending Physician Name and Address:  Thurnell Lose, MD  Relative Name and Phone Number:  [name] (IDPOEU)[phone]    Current Level of Care: Hospital Recommended Level of Care: [place] Prior Approval Number:    Date Approved/Denied:   PASRR Number: [id number]  Discharge Plan: SNF    Current Diagnoses: Patient Active Problem List   Diagnosis Date Noted  . Respiratory distress 04/12/2019  . Pneumonia due to severe acute respiratory syndrome coronavirus 2 (SARS-CoV-2) 04/11/2019  . COVID-19 virus infection 03/19/2019  . Chest pain 10/03/2018  . Malnutrition of moderate degree 09/21/2018  . MDD (major depressive disorder), recurrent episode, moderate ([place])   . Type II diabetes mellitus with renal manifestations ([place]) 05/04/2018  . GERD (gastroesophageal reflux disease) 05/04/2018  . S/P CABG (coronary artery bypass graft)   . Left testicular pain   . Depression 02/20/2018  . Epididymo-orchitis, acute 02/20/2018  . Essential hypertension 02/20/2018  . Precordial chest pain 02/20/2018  . AKI (acute kidney injury) ([place]) 02/14/2018  . HCAP (healthcare-associated pneumonia) 02/14/2018  . History of pulmonary embolism 07/04/2017  . Acute hyponatremia 05/11/2017  . Hyperglycemia due to type 2 diabetes mellitus ([place]) 05/11/2017  . CHF (congestive heart failure) ([place]) 04/01/2017  . Hepatitis C 12/30/2016  . Chronic ischemic heart disease 11/12/2016  . Paroxysmal A-fib ([place]) 11/12/2016  . Back pain 04/18/2016  . S/P carotid endarterectomy 11/15/2015  . MDD (major depressive disorder),  recurrent severe, without psychosis ([place]) 09/09/2015  . Cocaine-induced mood disorder ([place]) 08/14/2015  . Cocaine abuse with cocaine-induced mood disorder ([place]) 08/14/2015  . Gout 07/10/2015  . Acute renal failure superimposed on stage 3 chronic kidney disease ([place]) 03/06/2015  . Normocytic anemia 03/06/2015  . Chronic kidney disease, stage III (moderate) ([place]) 03/06/2015  . Hypoglycemia   . Encounter for general adult medical examination with abnormal findings 02/09/2015  . Cocaine use disorder, severe, dependence ([place]) 12/13/2014  . Substance or medication-induced depressive disorder with onset during withdrawal ([place]) 12/13/2014  . Severe recurrent major depressive disorder with psychotic features ([place]) 12/12/2014  . Cervicalgia 06/28/2014  . Lumbar radiculopathy, chronic 06/28/2014  . Asthma, chronic 02/03/2014  . 3-vessel CAD 06/24/2013  . ED (erectile dysfunction) of organic origin 07/07/2012  . Hypertension goal BP (blood pressure) < 140/80 04/29/2012  . Chondromalacia of left knee 03/19/2012  . Hyperlipidemia with target LDL less than 100 02/12/2012  . Fibromyalgia 02/12/2012  . HIV (human immunodeficiency virus infection) ([place]) 08/27/2011  . Uncontrolled type 2 diabetes with neuropathy ([place]) 10/17/2000    Orientation RESPIRATION BLADDER Height & Weight     Self, Time, Situation, Place  Normal Continent Weight: 164 lb (74.4 kg) Height:  5\' 7"  (170.2 cm)  BEHAVIORAL SYMPTOMS/MOOD NEUROLOGICAL BOWEL NUTRITION STATUS      Continent Diet(see discharge summary)  AMBULATORY STATUS COMMUNICATION OF NEEDS Skin   Limited Assist Verbally Normal                       Personal Care Assistance Level of Assistance  Bathing, Feeding, Dressing, Total care Bathing Assistance: Limited assistance Feeding assistance: Independent Dressing Assistance: Limited assistance  Total Care Assistance: Limited assistance   Functional Limitations Info  Sight, Hearing, Speech Sight Info:  Adequate Hearing Info: Adequate Speech Info: Adequate    SPECIAL CARE FACTORS FREQUENCY  PT (By licensed PT), OT (By licensed OT)     PT Frequency: min 5x weekly OT Frequency: min 5x weekly            Contractures Contractures Info: Not present    Additional Factors Info  Code Status, Allergies, Isolation Precautions Code Status Info: full Allergies Info: No Known Allergies     Isolation Precautions Info: air and contact precautions, emerging pathogen     Current Medications (04/16/2019):  This is the current hospital active medication list Current Facility-Administered Medications  Medication Dose Route Frequency Provider Last Rate Last Dose  . alum & mag hydroxide-simeth (MAALOX/MYLANTA) 200-200-20 MG/5ML suspension 30 mL  30 mL Oral Q6H PRN Thurnell Lose, MD   30 mL at 04/15/19 1746  . aspirin chewable tablet 81 mg  81 mg Oral Daily Dana Allan I, MD   81 mg at 04/16/19 0903  . chlorproMAZINE (THORAZINE) 12.5 mg in sodium chloride 0.9 % 25 mL IVPB  12.5 mg Intravenous Q8H PRN Thurnell Lose, MD   Stopped at 04/14/19 1634  . emtricitabine-rilpivir-tenofovir AF (ODEFSEY) 200-25-25 MG per tablet 1 tablet  1 tablet Oral Q breakfast Dana Allan I, MD   1 tablet at 04/16/19 0903  . famotidine (PEPCID) tablet 20 mg  20 mg Oral BID Thurnell Lose, MD   20 mg at 04/16/19 0903  . fluticasone (FLOVENT HFA) 110 MCG/ACT inhaler 1 puff  1 puff Inhalation BID Dana Allan I, MD   1 puff at 04/16/19 0904  . guaiFENesin-dextromethorphan (ROBITUSSIN DM) 100-10 MG/5ML syrup 5 mL  5 mL Oral Q4H PRN Thurnell Lose, MD   5 mL at 04/15/19 2143  . heparin injection 5,000 Units  5,000 Units Subcutaneous Q8H Dana Allan I, MD   5,000 Units at 04/16/19 0543  . insulin aspart (novoLOG) injection 0-15 Units  0-15 Units Subcutaneous TID WC Little Ishikawa, MD   5 Units at 04/16/19 1232  . insulin aspart (novoLOG) injection 0-5 Units  0-5 Units Subcutaneous QHS  Little Ishikawa, MD   3 Units at 04/15/19 2134  . insulin glargine (LANTUS) injection 20 Units  20 Units Subcutaneous BID Thurnell Lose, MD   20 Units at 04/16/19 [phone]  . Ipratropium-Albuterol (COMBIVENT) respimat 1 puff  1 puff Inhalation Q6H PRN Dana Allan I, MD      . nystatin (MYCOSTATIN) 100000 UNIT/ML suspension 500,000 Units  5 mL Oral QID Thurnell Lose, MD   500,000 Units at 04/16/19 [phone]  . ondansetron (ZOFRAN) injection 4 mg  4 mg Intravenous Once [name], MD         Discharge Medications: Please see discharge summary for a list of discharge medications.  Relevant Imaging Results:  Relevant Lab Results:   Additional Information SSN: [id number]  Alberteen Sam, LCSW

## 2019-04-16 NOTE — Progress Notes (Signed)
PROGRESS NOTE                                                                                                                                                                                                             Patient Demographics:    Michael [name], is a 68 y.o. malel [name], is a 68 y.o. male male, DOB - Jan 25, 1951, MEB:[id number]  Outpatient Primary MD for the patient is [name], PA-C    LOS - 5  Admit date - 04/11/2019    Chief Complaint  Patient presents with  . Shortness of Breath       Brief Narrative  Patient is a 68 year old African American male with past medical history significant for recent COVID-19 illness for which patient was admitted and discharged from the hospital about 2 weeks ago, pulmonary embolism, hypertension, hyperlipidemia, HIV, gout, diabetes mellitus, depression, crack cocaine use, coronary artery disease, asthma and atrial fibrillation.Patient presents with new onset of shortness of breath, weakness and fatigue that started about 3 days ago. Patient also reports pain light headache, dizzy, with associated nausea, vomiting and diarrhea. Repeat COVID-19 test was positive and he was admitted for further care.    Subjective:   Patient in bed, appears comfortable, denies any headache, no fever, no chest pain or pressure, no shortness of breath , no abdominal pain. No focal weakness.  Improved heartburn and odynophagia.     Assessment  & Plan :    1.  Acute Covid 19 Viral Pneumonitis during the ongoing 2020 Covid 19 Pandemic - no hypoxia or oxygen need, stable inflammatory markers, conservative management with supportive care only.    COVID-19 Labs  Recent Labs    04/14/19 0448 04/15/19 0300 04/16/19 0445  DDIMER 0.97* 1.43*  --   FERRITIN 70 67  --   CRP 0.9 <0.8 0.8    Lab Results  Component Value Date   SARSCOV2NAA POSITIVE (A) 04/11/2019   SARSCOV2NAA POSITIVE (A) 03/19/2019     Hepatic  Function Latest Ref Rng & Units 04/16/2019 04/11/2019 03/21/2019  Total Protein 6.5 - 8.1 g/dL 6.1(L) 7.0 6.6  Albumin 3.5 - 5.0 g/dL 2.8(L) 3.2(L) 3.1(L)  AST 15 - 41 U/L 14(L) 15 19  ALT 0 - 44 U/L _0 Alk Phosphatase 38 - 126 U/L 52 72 60  Total Bilirubin 0.3 - 1.2 mg/dL 0.1(L) 0.7 0.3  Bilirubin, Direct 0.0 - 0.2 mg/dL - - -        Component Value Date/Time   BNP 54.2 04/16/2019 0445      2.  Poorly controlled DM type II without DKA.  Patient compliance with medications on Lantus and sliding scale.  Extremely poor outpatient control due to hyperglycemia.  Will provide diabetic and insulin education here prior to discharge.  Lab Results  Component Value Date   HGBA1C 13.2 (H) 04/15/2019   CBG (last 3)  Recent Labs    04/15/19 1726 04/15/19 2131 04/16/19 0800  GLUCAP 203* 294* 124*    3.  HIV.  Under the care of [name] Home medications continued.  4.  AKI on CKD stage III.  After hydration back to baseline.  5.  Generalized weakness and deconditioning.  Needs placement.  Social worker to assist.  6.  Esophagitis along with mild odynophagia.  Placed on nystatin along with PPI.  Soft diet, encouraged to sit up in chair rather than in bed, his symptoms have improved but he will definitely require outpatient GI follow-up and EGD.  7.  Intermittent hiccups likely due to #6 above.  PRN Thorazine along with treatment as stated in #6 above.  8.  Ongoing cocaine abuse.  Counseled to quit.    Condition - Fair  Family Communication  :  None  Code Status :  Full  Diet :   Diet Order            DIET SOFT Room service appropriate? Yes; Fluid consistency: Thin  Diet effective now        Diet - low sodium heart healthy               Disposition Plan  :  SNF versus hotel.  Discussed with social work on 04/15/2019.  Consults  :  None  Procedures  :    CT head.  Nonacute.  CT chest. 1. Technically adequate exam showing no acute pulmonary embolus. 2.  Ascending thoracic aneurysm, 3.2 centimeters. Recommend annual imaging followup by CTA or MRA. This recommendation follows 2010 ACCF/AHA/AATS/ACR/ASA/SCA/SCAI/SIR/STS/SVM Guidelines for the Diagnosis and Management of Patients with Thoracic Aortic Disease. Circulation.2010; 121: O878-M767. Aortic aneurysm NOS (ICD10-I71.9) 3. Interval development of peripheral irregular ground-glass opacities primarily within the UPPER lobes. 4. Esophageal wall thickening, nonspecific but possibly related to esophagitis.   PUD Prophylaxis :   DVT Prophylaxis  :   Heparin    Lab Results  Component Value Date   PLT 165 04/16/2019    Inpatient Medications  Scheduled Meds: . aspirin  81 mg Oral Daily  . emtricitabine-rilpivir-tenofovir AF  1 tablet Oral Q breakfast  . famotidine  20 mg Oral BID  . fluticasone  1 puff Inhalation BID  . heparin  5,000 Units Subcutaneous Q8H  . insulin aspart  0-15 Units Subcutaneous TID WC  . insulin aspart  0-5 Units Subcutaneous QHS  . insulin glargine  20 Units Subcutaneous BID  . nystatin  5 mL Oral QID  . ondansetron  4 mg Intravenous Once   Continuous Infusions: . chlorproMAZINE (THORAZINE) IV Stopped (04/14/19 1634)   PRN Meds:.alum & mag hydroxide-simeth, chlorproMAZINE (THORAZINE) IV, guaiFENesin-dextromethorphan, Ipratropium-Albuterol  Antibiotics  :    Anti-infectives (From admission, onward)   Start     Dose/Rate Route Frequency Ordered Stop   04/13/19 0800  emtricitabine-rilpivir-tenofovir AF (ODEFSEY) 200-25-25 MG per tablet 1 tablet     1 tablet Oral Daily with breakfast 04/12/19 0335  Time Spent in minutes  30   Lala Lund M.D on 04/16/2019 at 11:06 AM  To page go to [email or web address] - password [facility]  [facility] -  Office  [phone]     See all Orders from today for further details    Objective:   Vitals:   04/15/19 2100 04/16/19 0427 04/16/19 0803 04/16/19 1011  BP: 104/62 103/75 (!) 110/56 119/74  Pulse: 78 87  97   Resp: 20 (!) 22    Temp: 99 F (37.2 C) 98.6 F (37 C) 98.6 F (37 C)   TempSrc: Oral Oral    SpO2: 100% 99%  100%  Weight:      Height:        Wt Readings from Last 3 Encounters:  04/11/19 74.4 kg  03/19/19 62.6 kg  12/16/18 74.4 kg     Intake/Output Summary (Last 24 hours) at 04/16/2019 1106 Last data filed at 04/16/2019 0804 Gross per 24 hour  Intake 1200 ml  Output 1150 ml  Net 50 ml     Physical Exam  Awake Alert,  No new F.N deficits, Normal affect [place].AT,PERRAL Supple Neck,No JVD, No cervical lymphadenopathy appriciated.  Symmetrical Chest wall movement, Good air movement bilaterally, CTAB RRR,No Gallops, Rubs or new Murmurs, No Parasternal Heave +ve B.Sounds, Abd Soft, No tenderness, No organomegaly appriciated, No rebound - guarding or rigidity. No Cyanosis, Clubbing or edema, No new Rash or bruise    Data Review:    CBC Recent Labs  Lab 04/11/19 1538 04/12/19 0600 04/16/19 0445  WBC 5.3 6.3 3.5*  HGB 11.2* 11.2* 9.2*  HCT 35.2* 34.2* 29.4*  PLT 178 269 165  MCV 81.7 79.7* 82.4  MCH 26.0 26.1 25.8*  MCHC 31.8 32.7 31.3  RDW 13.2 13.3 13.2  LYMPHSABS  --   --  1.1  MONOABS  --   --  0.4  EOSABS  --   --  0.1  BASOSABS  --   --  0.0    Chemistries  Recent Labs  Lab 04/11/19 1538 04/12/19 0600 04/12/19 0910 04/12/19 1330 04/12/19 1840 04/16/19 0445  NA 129* 135 134* 139 139 139  K 4.0 3.9 3.9 3.7 4.0 4.1  CL 97* 100 104 105 105 106  CO2 20* _0 GLUCOSE 591* 321* 362* 204* 220* 199*  BUN 39* 33* 31* 28* 26* 19  CREATININE 2.03* 1.92* 1.78* 1.69* 1.64* 1.42*  CALCIUM 7.5* 8.4* 7.8* 8.1* 8.1* 8.5*  MG  --   --  1.9 1.9 1.9 1.8  AST 15  --   --   --   --  14*  ALT 11  --   --   --   --  11  ALKPHOS 72  --   --   --   --  52  BILITOT 0.7  --   --   --   --  0.1*   ------------------------------------------------------------------------------------------------------------------ No results for input(s): CHOL, HDL, LDLCALC,  TRIG, CHOLHDL, LDLDIRECT in the last 72 hours.  Lab Results  Component Value Date   HGBA1C 13.2 (H) 04/15/2019   ------------------------------------------------------------------------------------------------------------------ No results for input(s): TSH, T4TOTAL, T3FREE, THYROIDAB in the last 72 hours.  Invalid input(s): FREET3  Cardiac Enzymes No results for input(s): CKMB, TROPONINI, MYOGLOBIN in the last 168 hours.  Invalid input(s): CK ------------------------------------------------------------------------------------------------------------------    Component Value Date/Time   BNP 54.2 04/16/2019 0445    Micro Results Recent Results (from the past 240 hour(s))  SARS Coronavirus  2 (CEPHEID- Performed in [place] lab), Hosp Order     Status: Abnormal   Collection Time: 04/11/19  3:40 PM   Specimen: Nasopharyngeal Swab  Result Value Ref Range Status   SARS Coronavirus 2 POSITIVE (A) NEGATIVE Final    Comment: RESULT CALLED TO, READ BACK BY AND VERIFIED WITH: SIMPSON AT 1756ON 04/11/2019 BY JPM (NOTE) If result is NEGATIVE SARS-CoV-2 target nucleic acids are NOT DETECTED. The SARS-CoV-2 RNA is generally detectable in upper and lower  respiratory specimens during the acute phase of infection. The lowest  concentration of SARS-CoV-2 viral copies this assay can detect is 250  copies / mL. A negative result does not preclude SARS-CoV-2 infection  and should not be used as the sole basis for treatment or other  patient management decisions.  A negative result may occur with  improper specimen collection / handling, submission of specimen other  than nasopharyngeal swab, presence of viral mutation(s) within the  areas targeted by this assay, and inadequate number of viral copies  (<250 copies / mL). A negative result must be combined with clinical  observations, patient history, and epidemiological information. If result is POSITIVE SARS-CoV-2 target nucleic  acids are DETECTED. The  SARS-CoV-2 RNA is generally detectable in upper and lower  respiratory specimens during the acute phase of infection.  Positive  results are indicative of active infection with SARS-CoV-2.  Clinical  correlation with patient history and other diagnostic information is  necessary to determine patient infection status.  Positive results do  not rule out bacterial infection or co-infection with other viruses. If result is PRESUMPTIVE POSTIVE SARS-CoV-2 nucleic acids MAY BE PRESENT.   A presumptive positive result was obtained on the submitted specimen  and confirmed on repeat testing.  While 2019 novel coronavirus  (SARS-CoV-2) nucleic acids may be present in the submitted sample  additional confirmatory testing may be necessary for epidemiological  and / or clinical management purposes  to differentiate between  SARS-CoV-2 and other Sarbecovirus currently known to infect humans.  If clinically indicated additional testing with an alternate test  methodology [phone]) is a dvised. The SARS-CoV-2 RNA is generally  detectable in upper and lower respiratory specimens during the acute  phase of infection. The expected result is Negative. Fact Sheet for Patients:  StrictlyIdeas.no Fact Sheet for Healthcare Providers: BankingDealers.co.za This test is not yet approved or cleared by the [place] FDA and has been authorized for detection and/or diagnosis of SARS-CoV-2 by FDA under an Emergency Use Authorization (EUA).  This EUA will remain in effect (meaning this test can be used) for the duration of the COVID-19 declaration under Section 564(b)(1) of the Act, 21 U.S.C. section 360bbb-3(b)(1), unless the authorization is terminated or revoked sooner. Performed at [facility], [place] [address] [place], [place] [address]   Gastrointestinal Panel by PCR , Stool     Status: None   Collection Time:  04/12/19  3:36 AM   Specimen: Stool  Result Value Ref Range Status   Campylobacter species NOT DETECTED NOT DETECTED Final   Plesimonas shigelloides NOT DETECTED NOT DETECTED Final   Salmonella species NOT DETECTED NOT DETECTED Final   Yersinia enterocolitica NOT DETECTED NOT DETECTED Final   Vibrio species NOT DETECTED NOT DETECTED Final   Vibrio cholerae NOT DETECTED NOT DETECTED Final   Enteroaggregative E coli (EAEC) NOT DETECTED NOT DETECTED Final   Enteropathogenic E coli (EPEC) NOT DETECTED NOT DETECTED Final   Enterotoxigenic E coli (ETEC) NOT DETECTED  NOT DETECTED Final   Shiga like toxin producing E coli (STEC) NOT DETECTED NOT DETECTED Final   Shigella/Enteroinvasive E coli (EIEC) NOT DETECTED NOT DETECTED Final   Cryptosporidium NOT DETECTED NOT DETECTED Final   Cyclospora cayetanensis NOT DETECTED NOT DETECTED Final   Entamoeba histolytica NOT DETECTED NOT DETECTED Final   Giardia lamblia NOT DETECTED NOT DETECTED Final   Adenovirus F40/41 NOT DETECTED NOT DETECTED Final   Astrovirus NOT DETECTED NOT DETECTED Final   Norovirus GI/GII NOT DETECTED NOT DETECTED Final   Rotavirus A NOT DETECTED NOT DETECTED Final   Sapovirus (I, II, IV, and V) NOT DETECTED NOT DETECTED Final    Comment: Performed at [facility], [address] [place], [place] [address]  C difficile quick scan w PCR reflex     Status: None   Collection Time: 04/12/19  3:36 AM   Specimen: Stool  Result Value Ref Range Status   C Diff antigen NEGATIVE NEGATIVE Final   C Diff toxin NEGATIVE NEGATIVE Final   C Diff interpretation No C. difficile detected.  Final    Comment: Performed at [facility], [place] [address] [place], [place] [address]    Radiology Reports Ct Head Wo Contrast  Result Date: 04/11/2019 CLINICAL DATA:  Patient with headache for 3-4 days. Recently recovered from [place]. EXAM: CT HEAD WITHOUT CONTRAST TECHNIQUE: Contiguous axial images were obtained from the  base of the skull through the vertex without intravenous contrast. COMPARISON:  Brain CT 09/02/2018 FINDINGS: Brain: Ventricles and sulci are prominent compatible with atrophy. Periventricular and subcortical white matter hypodensity compatible with chronic microvascular ischemic changes. No evidence for acute cortically based infarct, intracranial hemorrhage, mass lesion or mass-effect. Vascular: Unremarkable Skull: Intact. Sinuses/Orbits: Paranasal sinuses are well aerated. Mastoid air cells are unremarkable. Orbits are unremarkable. Other: None. IMPRESSION: No acute intracranial process. Atrophy and chronic microvascular ischemic changes. Electronically Signed   By: Lovey Newcomer M.D.   On: 04/11/2019 16:40   Ct Angio Chest Pe W Or Wo Contrast  Result Date: 04/12/2019 CLINICAL DATA:  Pt states that he has been short of breath and can't catch his breath Patient discharged form [facility] on 6/12, repeat covid test + history of PE EXAM: CT ANGIOGRAPHY CHEST WITH CONTRAST TECHNIQUE: Multidetector CT imaging of the chest was performed using the standard protocol during bolus administration of intravenous contrast. Multiplanar CT image reconstructions and MIPs were obtained to evaluate the vascular anatomy. CONTRAST:  73m OMNIPAQUE IOHEXOL 350 MG/ML SOLN COMPARISON:  03/19/2019 FINDINGS: Cardiovascular: The pulmonary arteries are well opacified and there is no acute pulmonary embolus. Status post CABG. Note is made of bovine arch anatomy. Aorta is tortuous but not aneurysmal. There is minimal atherosclerotic calcification of the thoracic arch. Ascending aorta is 3.2 centimeters. Mediastinum/Nodes: There is Conference will thickening of the thoracic esophagus throughout its course, without mass. No mediastinal, axillary, or hilar adenopathy. The visualized portion of the thyroid gland has a normal appearance. Lungs/Pleura: There has been interval development of peripheral irregular ground-glass opacities primarily within the  UPPER lobes but also involving the LOWER lobes. There is stable RIGHT LOWER lobe atelectasis or scarring. Mild bronchial wall thickening. No pleural effusions or frank consolidations. Upper Abdomen: No acute abnormality. Musculoskeletal: Median sternotomy. Midthoracic spondylosis. Review of the MIP images confirms the above findings. IMPRESSION: 1. Technically adequate exam showing no acute pulmonary embolus. 2. Ascending thoracic aneurysm, 3.2 centimeters. Recommend annual imaging followup by CTA or MRA. This recommendation follows 2010 ACCF/AHA/AATS/ACR/ASA/SCA/SCAI/SIR/STS/SVM Guidelines for  the Diagnosis and Management of Patients with Thoracic Aortic Disease. Circulation.2010; 121: B837-R939. Aortic aneurysm NOS (ICD10-I71.9) 3. Interval development of peripheral irregular ground-glass opacities primarily within the UPPER lobes. 4. Esophageal wall thickening, nonspecific but possibly related to esophagitis. Electronically Signed   By: Nolon Nations M.D.   On: 04/12/2019 10:09      Dg Chest Portable 1 View  Result Date: 04/11/2019 CLINICAL DATA:  Shortness of breath. EXAM: PORTABLE CHEST 1 VIEW COMPARISON:  Chest radiograph 03/19/2019 FINDINGS: Monitoring leads overlie the patient. Patient status post median sternotomy. Normal cardiac and mediastinal contours. No consolidative pulmonary opacities. No pleural effusion or pneumothorax. IMPRESSION: No acute cardiopulmonary process. Electronically Signed   By: Lovey Newcomer M.D.   On: 04/11/2019 15:54

## 2019-04-16 NOTE — Care Management (Addendum)
Case manager acknowledges consult for assisting with PCP for DM. Will get patient setup with [facility] if possible.Patient will not be able to have face to face appointment due to [place]. Sent message to MD to please send scripts to [facility] today if patient weekend discharge is anticipated. TOC can not fill on Saturday/Sunday.

## 2019-04-16 NOTE — Progress Notes (Addendum)
OT Treatment Note  Pt able to mobilize with min guard A using rollator, but is a high fall risk. Pt fell PTA, but doesn't remember where he fell or how he fell. Pt states he got "knocked out". Possibly post - concussive, but most likely has cognitive deficits at baseline. Will further assess with the Pillbox Test. Pt expresses concerns regarding medication management and completing his own IADL tasks. At this time, continue to recommend SNF to address ADL/IADL tasks in addition to mobility needed to DC safely  to community.     04/16/19 1400  OT Visit Information  Last OT Received On 04/16/19  Assistance Needed +1  History of Present Illness 68 year old male with recent COVID-19 illness for which patient was admitted and discharged from the hospital about 2 weeks ago. Presenting with SOB, weakness, and fatigue. Repeat COVID-19 test was positive. Past medical history significant for pulmonary embolism, HTN, hyperlipidemia, HIV, gout, diabetes mellitus, depression, crack cocaine use, CAD, asthma and A-fib.   Precautions  Precautions Fall  Precaution Comments slow gait speed and AD use puts him at higher risk for falls  Pain Assessment  Pain Assessment 0-10  Faces Pain Scale 4  Pain Location chest  Pain Descriptors / Indicators Discomfort;Grimacing  Pain Intervention(s) Limited activity within patient's tolerance  Cognition  Arousal/Alertness Awake/alert  Behavior During Therapy WFL for tasks assessed/performed  Overall Cognitive Status Impaired/Different from baseline  Area of Impairment Problem solving;Memory;Awareness;Attention  Orientation Level Time  Current Attention Level Sustained  Memory Decreased short-term memory  Awareness Emergent  Problem Solving Slow processing  General Comments Pt states he has increased difficulty with his memory. Fell PTA but unable to tell me where or how; has difficulty managing meds  Upper Extremity Assessment  Upper Extremity Assessment Generalized  weakness  Lower Extremity Assessment  Lower Extremity Assessment Defer to PT evaluation  ADL  Overall ADL's  Needs assistance/impaired  Grooming Min guard;Standing  Grooming Details (indicate cue type and reason) standing at sink  Lower Body Dressing Min guard;Sit to/from stand  Lower Body Dressing Details (indicate cue type and reason) Able to donn socks with increased time; may benefit from sock aid; has difficulty reaching items on florr which puts him at increased risk for falls  Functional mobility during ADLs Min guard (rollator)  General ADL Comments Expresses concern with managing medicaiton in addition to IADL tasks  Bed Mobility  General bed mobility comments OOB in chair  Balance  Overall balance assessment Needs assistance  Sitting-balance support Feet supported;No upper extremity supported  Sitting balance-Leahy Scale Fair  Standing balance support Single extremity supported;Bilateral upper extremity supported  Standing balance-Leahy Scale Fair  Standing balance comment close supervision for static standing, needs external support from RW for dynamic balance  Transfers  Overall transfer level Needs assistance  Equipment used Rolling walker (2 wheeled);4-wheeled walker  Transfers Sit to/from Stand  Sit to [employer] transfer comment supervision for safety  Exercises  Exercises General Upper Extremity;General Lower Extremity  General Exercises - Upper Extremity  Shoulder Flexion AROM;Both;10 reps;Theraband  Theraband Level (Shoulder Flexion) Level 1 (Yellow)  Shoulder ABduction AROM;Both;10 reps;Theraband  Theraband Level (Shoulder Abduction) Level 1 (Yellow)  Elbow Flexion AROM;Both;10 reps;Theraband  Theraband Level (Elbow Flexion) Level 1 (Yellow)  Elbow Extension AROM;Both;10 reps;Theraband  Theraband Level (Elbow Extension) Level 1 (Yellow)  General Exercises - Lower Extremity  Long Arc Quad  (2 second holds)  Hip Flexion/Marching AROM;Both;10  reps  OT - End of Session  Equipment Utilized  During Treatment Gait belt;Rolling walker;Other (comment) (rollator)  Activity Tolerance Patient tolerated treatment well  Patient left in chair;with call bell/phone within reach;with chair alarm set  Nurse Communication Mobility status  OT Assessment/Plan  OT Plan Discharge plan remains appropriate  OT Visit Diagnosis Unsteadiness on feet (R26.81);Other abnormalities of gait and mobility (R26.89);Muscle weakness (generalized) (M62.81)  OT Frequency (ACUTE ONLY) Min 2X/week  Recommendations for Other Services PT consult  Follow Up Recommendations SNF;Supervision/Assistance - 24 hour  OT Equipment 3 in 1 bedside commode  AM-PAC OT "6 Clicks" Daily Activity Outcome Measure (Version 2)  Help from another person eating meals? 4  Help from another person taking care of personal grooming? 4  Help from another person toileting, which includes using toliet, bedpan, or urinal? 3  Help from another person bathing (including washing, rinsing, drying)? 3  Help from another person to put on and taking off regular upper body clothing? 3  Help from another person to put on and taking off regular lower body clothing? 3  6 Click Score 20  OT Goal Progression  Progress towards OT goals Progressing toward goals  Acute Rehab OT Goals  Patient Stated Goal to find a place to live  OT Goal Formulation With patient  Time For Goal Achievement 04/27/19  Potential to Achieve Goals Good  ADL Goals  Pt Will Perform Grooming with modified independence;standing  Pt Will Perform Lower Body Dressing with modified independence;sit to/from stand  Pt Will Transfer to Toilet with modified independence;ambulating;regular height toilet  Pt Will Perform Toileting - Clothing Manipulation and hygiene with modified independence;sitting/lateral leans;sit to/from stand  Additional ADL Goal #1 Pt will demonstrate increased activity tolerance to perform three ADLs in standing with  supervision  OT Time Calculation  OT Start Time (ACUTE ONLY) 1405  OT Stop Time (ACUTE ONLY) 1436  OT Time Calculation (min) 31 min  OT General Charges  $OT Visit 1 Visit  OT Treatments  $Self Care/Home Management  8-22 mins  $Therapeutic Exercise 8-22 mins  Maurie Boettcher, OT/L   Acute OT Clinical Specialist [place] Pager [phone] Office [phone]

## 2019-04-17 LAB — CBC WITH DIFFERENTIAL/PLATELET
Abs Immature Granulocytes: 0.07 10*3/uL (ref 0.00–0.07)
Basophils Absolute: 0 10*3/uL (ref 0.0–0.1)
Basophils Relative: 1 %
Eosinophils Absolute: 0.1 10*3/uL (ref 0.0–0.5)
Eosinophils Relative: 2 %
HCT: 29.4 % — ABNORMAL LOW (ref 39.0–52.0)
Hemoglobin: 9.1 g/dL — ABNORMAL LOW (ref 13.0–17.0)
Immature Granulocytes: 2 %
Lymphocytes Relative: 31 %
Lymphs Abs: 1.3 10*3/uL (ref 0.7–4.0)
MCH: 25.4 pg — ABNORMAL LOW (ref 26.0–34.0)
MCHC: 31 g/dL (ref 30.0–36.0)
MCV: 82.1 fL (ref 80.0–100.0)
Monocytes Absolute: 0.5 10*3/uL (ref 0.1–1.0)
Monocytes Relative: 11 %
Neutro Abs: 2.3 10*3/uL (ref 1.7–7.7)
Neutrophils Relative %: 53 %
Platelets: 183 10*3/uL (ref 150–400)
RBC: 3.58 MIL/uL — ABNORMAL LOW (ref 4.22–5.81)
RDW: 13.4 % (ref 11.5–15.5)
WBC: 4.2 10*3/uL (ref 4.0–10.5)
nRBC: 0 % (ref 0.0–0.2)

## 2019-04-17 LAB — COMPREHENSIVE METABOLIC PANEL
ALT: 12 U/L (ref 0–44)
AST: 14 U/L — ABNORMAL LOW (ref 15–41)
Albumin: 2.9 g/dL — ABNORMAL LOW (ref 3.5–5.0)
Alkaline Phosphatase: 48 U/L (ref 38–126)
Anion gap: 8 (ref 5–15)
BUN: 28 mg/dL — ABNORMAL HIGH (ref 8–23)
CO2: 25 mmol/L (ref 22–32)
Calcium: 8.4 mg/dL — ABNORMAL LOW (ref 8.9–10.3)
Chloride: 106 mmol/L (ref 98–111)
Creatinine, Ser: 1.42 mg/dL — ABNORMAL HIGH (ref 0.61–1.24)
GFR calc Af Amer: 58 mL/min — ABNORMAL LOW (ref >60)
GFR calc non Af Amer: 50 mL/min — ABNORMAL LOW (ref >60)
Glucose, Bld: 144 mg/dL — ABNORMAL HIGH (ref 70–99)
Potassium: 4 mmol/L (ref 3.5–5.1)
Sodium: 139 mmol/L (ref 135–145)
Total Bilirubin: 0.2 mg/dL — ABNORMAL LOW (ref 0.3–1.2)
Total Protein: 6.1 g/dL — ABNORMAL LOW (ref 6.5–8.1)

## 2019-04-17 LAB — GLUCOSE, CAPILLARY
Glucose-Capillary: 90 mg/dL (ref 70–99)
Glucose-Capillary: 266 mg/dL — ABNORMAL HIGH (ref 70–99)
Glucose-Capillary: 218 mg/dL — ABNORMAL HIGH (ref 70–99)

## 2019-04-17 LAB — MAGNESIUM: Magnesium: 1.9 mg/dL (ref 1.7–2.4)

## 2019-04-17 LAB — C-REACTIVE PROTEIN: CRP: 1.2 mg/dL — ABNORMAL HIGH (ref <1.0)

## 2019-04-17 LAB — BRAIN NATRIURETIC PEPTIDE: B Natriuretic Peptide: 59.7 pg/mL (ref 0.0–100.0)

## 2019-04-17 NOTE — Progress Notes (Signed)
Physical Therapy Treatment Patient Details Name: Michael [name] MRN: [id number] DOB: [birth year] Today's Date: 04/17/2019    History of Present Illness 68 year old male with recent COVID-69 illness for which patient was admitted and discharged from the hospital about 2 weeks ago. Presenting with SOB, weakness, and fatigue. Repeat COVID-19 test was positive. Past medical history significant for pulmonary embolism, HTN, hyperlipidemia, HIV, gout, diabetes mellitus, depression, crack cocaine use, CAD, asthma and A-fib.     PT Comments    Pt continues to make slow progress. Remains high fall risk. Poor social support.    Follow Up Recommendations  SNF     Equipment Recommendations  Rolling walker with 5" wheels;3in1 (PT)    Recommendations for Other Services       Precautions / Restrictions Precautions Precautions: Fall Precaution Comments: slow gait speed and AD use puts him at higher risk for falls Restrictions Weight Bearing Restrictions: No    Mobility  Bed Mobility Overal bed mobility: Independent                Transfers Overall transfer level: Needs assistance Equipment used: Rolling walker (2 wheeled) Transfers: Sit to/from Stand Sit to Stand: Supervision;Min guard         General transfer comment: supervision on initial sit to stand but min guard with repeated sit to stands  Ambulation/Gait Ambulation/Gait assistance: Supervision;Min guard Gait Distance (Feet): 100 Feet Assistive device: Rolling walker (2 wheeled) Gait Pattern/deviations: Step-through pattern;Decreased step length - right;Decreased step length - left;Scissoring;Decreased dorsiflexion - right;Decreased dorsiflexion - left(Low clearance of feet on swing phase) Gait velocity: decreased Gait velocity interpretation: <1.8 ft/sec, indicate of risk for recurrent falls General Gait Details: Assist for safety especially as pt fatigued.    Stairs             Wheelchair Mobility     Modified Rankin (Stroke Patients Only)       Balance Overall balance assessment: Needs assistance Sitting-balance support: Feet supported;No upper extremity supported Sitting balance-Leahy Scale: Fair     Standing balance support: No upper extremity supported;During functional activity Standing balance-Leahy Scale: Fair Standing balance comment: supervision for static standing and assist and/or UE support for dynamic activities                            Cognition Arousal/Alertness: Awake/alert Behavior During Therapy: WFL for tasks assessed/performed Overall Cognitive Status: Impaired/Different from baseline Area of Impairment: Problem solving                             Problem Solving: Slow processing General Comments: Delayed responses      Exercises General Exercises - Lower Extremity Hip Flexion/Marching: AROM;Both;10 reps;Standing Mini-Sqauts: Strengthening;Both;5 reps;Standing Other Exercises Other Exercises: repeated sit to stand from bed x 3    General Comments        Pertinent Vitals/Pain Pain Assessment: Faces Faces Pain Scale: No hurt    Home Living                      Prior Function            PT Goals (current goals can now be found in the care plan section) Acute Rehab PT Goals Patient Stated Goal: not stated Progress towards PT goals: Progressing toward goals    Frequency    Min 3X/week      PT Plan Current plan remains appropriate;Discharge  plan needs to be updated    Co-evaluation              AM-PAC PT "6 Clicks" Mobility   Outcome Measure  Help needed turning from your back to your side while in a flat bed without using bedrails?: None Help needed moving from lying on your back to sitting on the side of a flat bed without using bedrails?: None Help needed moving to and from a bed to a chair (including a wheelchair)?: A Little Help needed standing up from a chair using your arms (e.g.,  wheelchair or bedside chair)?: A Little Help needed to walk in hospital room?: A Little Help needed climbing 3-5 steps with a railing? : A Lot 6 Click Score: 19    End of Session Equipment Utilized During Treatment: Gait belt Activity Tolerance: Patient limited by fatigue Patient left: in bed;with call bell/phone within reach Nurse Communication: Mobility status PT Visit Diagnosis: Unsteadiness on feet (R26.81);Difficulty in walking, not elsewhere classified (R26.2);Pain     Time: 7096-4383 PT Time Calculation (min) (ACUTE ONLY): 21 min  Charges:  $Gait Training: 8-22 mins                     [place] Pager [phone] Office [place] 04/17/2019, 4:52 PM

## 2019-04-17 NOTE — Care Management (Addendum)
Case manager spoke with patient concerning discharge needs. He says that he uses [facility] on [address]. [phone], Fax [phone]. They close at 3pm today(Saturday) and are not open on Sunday.  Case manager confirmed that they have gluccometers, test strips and lancets. They will also deliver to patient when discharge address is known. Case manager will ask MD to send prescriptions to them. Will continue to follow with Education officer, museum for place of discharge.  After conversation with MD, there are concerns for safety. Patient needs SNF Will also need repeat COVID test..    Ricki Miller, RN BSN Case Manager [phone]

## 2019-04-17 NOTE — Progress Notes (Signed)
Patient stated he was updating family on his progress and that I did not need to call anyone for him.  Earleen Reaper RN

## 2019-04-17 NOTE — Progress Notes (Signed)
PROGRESS NOTE                                                                                                                                                                                                             Patient Demographics:    Michael [name], is a 68 y.o. male, DOB - 1951-10-05, AQT:[id number]  Outpatient Primary MD for the patient is Tawny Asal    LOS - 6  Admit date - 04/11/2019    Chief Complaint  Patient presents with  . Shortness of Breath       Brief Narrative  Patient is a 68 year old African American male with past medical history significant for recent COVID-19 illness for which patient was admitted and discharged from the hospital about 2 weeks ago, pulmonary embolism, hypertension, hyperlipidemia, HIV, gout, diabetes mellitus, depression, crack cocaine use, coronary artery disease, asthma and atrial fibrillation.Patient presents with new onset of shortness of breath, weakness and fatigue that started about 3 days ago. Patient also reports pain light headache, dizzy, with associated nausea, vomiting and diarrhea. Repeat COVID-19 test was positive and he was admitted for further care.    Subjective:   Patient in bed, appears comfortable, denies any headache, no fever, no chest pain or pressure, no shortness of breath , no abdominal pain. No focal weakness. Much improved heartburn and odynophagia.     Assessment  & Plan :    1.  Acute Covid 19 Viral Pneumonitis during the ongoing 2020 Covid 19 Pandemic - no hypoxia or oxygen need, stable inflammatory markers, conservative management with supportive care only.    COVID-19 Labs  Recent Labs    04/15/19 0300 04/16/19 0445 04/17/19 0515  DDIMER 1.43*  --   --   FERRITIN 67  --   --   CRP <0.8 0.8 1.2*    Lab Results  Component Value Date   SARSCOV2NAA POSITIVE (A) 04/11/2019   SARSCOV2NAA POSITIVE (A) 03/19/2019      Hepatic Function Latest Ref Rng & Units 04/17/2019 04/16/2019 04/11/2019  Total Protein 6.5 - 8.1 g/dL 6.1(L) 6.1(L) 7.0  Albumin 3.5 - 5.0 g/dL 2.9(L) 2.8(L) 3.2(L)  AST 15 - 41 U/L 14(L) 14(L) 15  ALT 0 - 44 U/L '12 11 11  ' Alk Phosphatase 38 - 126 U/L 48 52 72  Total Bilirubin 0.3 - 1.2  mg/dL 0.2(L) 0.1(L) 0.7  Bilirubin, Direct 0.0 - 0.2 mg/dL - - -        Component Value Date/Time   BNP 59.7 04/17/2019 0515      2.  Poorly controlled DM type II without DKA.  Patient compliance with medications on Lantus and sliding scale.  Extremely poor outpatient control due to hyperglycemia.  Will provide diabetic and insulin education here prior to discharge.  Lab Results  Component Value Date   HGBA1C 13.2 (H) 04/15/2019   CBG (last 3)  Recent Labs    04/16/19 1707 04/16/19 2147 04/17/19 0841  GLUCAP 255* 255* 90    3.  HIV.  Under the care of [name] Home medications continued.  4.  AKI on CKD stage III.  After hydration back to baseline.  5.  Generalized weakness and deconditioning.  Needs placement.  Social worker to assist.  6.  Esophagitis along with mild odynophagia.  Placed on nystatin along with PPI.  Soft diet, encouraged to sit up in chair rather than in bed, his symptoms have improved but he will definitely require outpatient GI follow-up and EGD.  7.  Intermittent hiccups likely due to #6 above.  PRN Thorazine along with treatment as stated in #6 above.  8.  Ongoing cocaine abuse.  Counseled to quit.    Condition - Fair  Family Communication  :  None  Code Status :  Full  Diet :   Diet Order            DIET SOFT Room service appropriate? Yes; Fluid consistency: Thin  Diet effective now        Diet - low sodium heart healthy               Disposition Plan  :  SNF versus hotel.  Discussed with social work on 04/15/2019.  Consults  :  None  Procedures  :    CT head.  Nonacute.  CT chest. 1. Technically adequate exam showing no acute  pulmonary embolus. 2. Ascending thoracic aneurysm, 3.2 centimeters. Recommend annual imaging followup by CTA or MRA. This recommendation follows 2010 ACCF/AHA/AATS/ACR/ASA/SCA/SCAI/SIR/STS/SVM Guidelines for the Diagnosis and Management of Patients with Thoracic Aortic Disease. Circulation.2010; 121: H150-V697. Aortic aneurysm NOS (ICD10-I71.9) 3. Interval development of peripheral irregular ground-glass opacities primarily within the UPPER lobes. 4. Esophageal wall thickening, nonspecific but possibly related to esophagitis.   PUD Prophylaxis :   DVT Prophylaxis  :   Heparin    Lab Results  Component Value Date   PLT 183 04/17/2019    Inpatient Medications  Scheduled Meds: . aspirin  81 mg Oral Daily  . emtricitabine-rilpivir-tenofovir AF  1 tablet Oral Q breakfast  . famotidine  20 mg Oral BID  . fluticasone  1 puff Inhalation BID  . heparin  5,000 Units Subcutaneous Q8H  . insulin aspart  0-15 Units Subcutaneous TID WC  . insulin aspart  0-5 Units Subcutaneous QHS  . insulin glargine  20 Units Subcutaneous BID  . nystatin  5 mL Oral QID  . ondansetron  4 mg Intravenous Once   Continuous Infusions: . chlorproMAZINE (THORAZINE) IV Stopped (04/14/19 1634)   PRN Meds:.alum & mag hydroxide-simeth, chlorproMAZINE (THORAZINE) IV, guaiFENesin-dextromethorphan, Ipratropium-Albuterol  Antibiotics  :    Anti-infectives (From admission, onward)   Start     Dose/Rate Route Frequency Ordered Stop   04/13/19 0800  emtricitabine-rilpivir-tenofovir AF (ODEFSEY) 200-25-25 MG per tablet 1 tablet     1 tablet Oral Daily with breakfast  04/12/19 0335         Time Spent in minutes  30   Lala Lund M.D on 04/17/2019 at 11:45 AM  To page go to [email or web address] - password [facility]  [facility] -  Office  [phone]     See all Orders from today for further details    Objective:   Vitals:   04/16/19 1730 04/16/19 2154 04/17/19 0500 04/17/19 0911  BP: 121/82  129/69 121/78   Pulse: 87  100   Resp: '20  20 18  ' Temp: 97.9 F (36.6 C) 98.8 F (37.1 C)  98.6 F (37 C)  TempSrc: Oral Oral  Oral  SpO2: 100%  100% 100%  Weight:      Height:        Wt Readings from Last 3 Encounters:  04/11/19 74.4 kg  03/19/19 62.6 kg  12/16/18 74.4 kg     Intake/Output Summary (Last 24 hours) at 04/17/2019 1145 Last data filed at 04/17/2019 1000 Gross per 24 hour  Intake 600 ml  Output 1200 ml  Net -600 ml     Physical Exam  Awake Alert, Oriented X 3, No new F.N deficits, Normal affect [place].AT,PERRAL Supple Neck,No JVD, No cervical lymphadenopathy appriciated.  Symmetrical Chest wall movement, Good air movement bilaterally, CTAB RRR,No Gallops, Rubs or new Murmurs, No Parasternal Heave +ve B.Sounds, Abd Soft, No tenderness, No organomegaly appriciated, No rebound - guarding or rigidity. No Cyanosis, Clubbing or edema, No new Rash or bruise    Data Review:    CBC Recent Labs  Lab 04/11/19 1538 04/12/19 0600 04/16/19 0445 04/17/19 0515  WBC 5.3 6.3 3.5* 4.2  HGB 11.2* 11.2* 9.2* 9.1*  HCT 35.2* 34.2* 29.4* 29.4*  PLT 178 269 165 183  MCV 81.7 79.7* 82.4 82.1  MCH 26.0 26.1 25.8* 25.4*  MCHC 31.8 32.7 31.3 31.0  RDW 13.2 13.3 13.2 13.4  LYMPHSABS  --   --  1.1 1.3  MONOABS  --   --  0.4 0.5  EOSABS  --   --  0.1 0.1  BASOSABS  --   --  0.0 0.0    Chemistries  Recent Labs  Lab 04/11/19 1538  04/12/19 0910 04/12/19 1330 04/12/19 1840 04/16/19 0445 04/17/19 0515  NA 129*   < > 134* 139 139 139 139  K 4.0   < > 3.9 3.7 4.0 4.1 4.0  CL 97*   < > 104 105 105 106 106  CO2 20*   < > '24 25 26 25 25  ' GLUCOSE 591*   < > 362* 204* 220* 199* 144*  BUN 39*   < > 31* 28* 26* 19 28*  CREATININE 2.03*   < > 1.78* 1.69* 1.64* 1.42* 1.42*  CALCIUM 7.5*   < > 7.8* 8.1* 8.1* 8.5* 8.4*  MG  --   --  1.9 1.9 1.9 1.8 1.9  AST 15  --   --   --   --  14* 14*  ALT 11  --   --   --   --  11 12  ALKPHOS 72  --   --   --   --  52 48  BILITOT 0.7  --   --   --    --  0.1* 0.2*   < > = values in this interval not displayed.   ------------------------------------------------------------------------------------------------------------------ No results for input(s): CHOL, HDL, LDLCALC, TRIG, CHOLHDL, LDLDIRECT in the last 72 hours.  Lab Results  Component Value Date  HGBA1C 13.2 (H) 04/15/2019   ------------------------------------------------------------------------------------------------------------------ No results for input(s): TSH, T4TOTAL, T3FREE, THYROIDAB in the last 72 hours.  Invalid input(s): FREET3  Cardiac Enzymes No results for input(s): CKMB, TROPONINI, MYOGLOBIN in the last 168 hours.  Invalid input(s): CK ------------------------------------------------------------------------------------------------------------------    Component Value Date/Time   BNP 59.7 04/17/2019 0515    Micro Results Recent Results (from the past 240 hour(s))  SARS Coronavirus 2 (CEPHEID- Performed in [place] hospital lab), Hosp Order     Status: Abnormal   Collection Time: 04/11/19  3:40 PM   Specimen: Nasopharyngeal Swab  Result Value Ref Range Status   SARS Coronavirus 2 POSITIVE (A) NEGATIVE Final    Comment: RESULT CALLED TO, READ BACK BY AND VERIFIED WITH: SIMPSON AT 1756ON 04/11/2019 BY JPM (NOTE) If result is NEGATIVE SARS-CoV-2 target nucleic acids are NOT DETECTED. The SARS-CoV-2 RNA is generally detectable in upper and lower  respiratory specimens during the acute phase of infection. The lowest  concentration of SARS-CoV-2 viral copies this assay can detect is 250  copies / mL. A negative result does not preclude SARS-CoV-2 infection  and should not be used as the sole basis for treatment or other  patient management decisions.  A negative result may occur with  improper specimen collection / handling, submission of specimen other  than nasopharyngeal swab, presence of viral mutation(s) within the  areas targeted by this assay,  and inadequate number of viral copies  (<250 copies / mL). A negative result must be combined with clinical  observations, patient history, and epidemiological information. If result is POSITIVE SARS-CoV-2 target nucleic acids are DETECTED. The  SARS-CoV-2 RNA is generally detectable in upper and lower  respiratory specimens during the acute phase of infection.  Positive  results are indicative of active infection with SARS-CoV-2.  Clinical  correlation with patient history and other diagnostic information is  necessary to determine patient infection status.  Positive results do  not rule out bacterial infection or co-infection with other viruses. If result is PRESUMPTIVE POSTIVE SARS-CoV-2 nucleic acids MAY BE PRESENT.   A presumptive positive result was obtained on the submitted specimen  and confirmed on repeat testing.  While 2019 novel coronavirus  (SARS-CoV-2) nucleic acids may be present in the submitted sample  additional confirmatory testing may be necessary for epidemiological  and / or clinical management purposes  to differentiate between  SARS-CoV-2 and other Sarbecovirus currently known to infect humans.  If clinically indicated additional testing with an alternate test  methodology [phone]) is a dvised. The SARS-CoV-2 RNA is generally  detectable in upper and lower respiratory specimens during the acute  phase of infection. The expected result is Negative. Fact Sheet for Patients:  StrictlyIdeas.no Fact Sheet for Healthcare Providers: BankingDealers.co.za This test is not yet approved or cleared by the [place] FDA and has been authorized for detection and/or diagnosis of SARS-CoV-2 by FDA under an Emergency Use Authorization (EUA).  This EUA will remain in effect (meaning this test can be used) for the duration of the COVID-19 declaration under Section 564(b)(1) of the Act, 21 U.S.C. section 360bbb-3(b)(1), unless  the authorization is terminated or revoked sooner. Performed at [facility], [place] [address], [place], [place] [address]   Gastrointestinal Panel by PCR , Stool     Status: None   Collection Time: 04/12/19  3:36 AM   Specimen: Stool  Result Value Ref Range Status   Campylobacter species NOT DETECTED NOT DETECTED Final   Plesimonas shigelloides  NOT DETECTED NOT DETECTED Final   Salmonella species NOT DETECTED NOT DETECTED Final   Yersinia enterocolitica NOT DETECTED NOT DETECTED Final   Vibrio species NOT DETECTED NOT DETECTED Final   Vibrio cholerae NOT DETECTED NOT DETECTED Final   Enteroaggregative E coli (EAEC) NOT DETECTED NOT DETECTED Final   Enteropathogenic E coli (EPEC) NOT DETECTED NOT DETECTED Final   Enterotoxigenic E coli (ETEC) NOT DETECTED NOT DETECTED Final   Shiga like toxin producing E coli (STEC) NOT DETECTED NOT DETECTED Final   Shigella/Enteroinvasive E coli (EIEC) NOT DETECTED NOT DETECTED Final   Cryptosporidium NOT DETECTED NOT DETECTED Final   Cyclospora cayetanensis NOT DETECTED NOT DETECTED Final   Entamoeba histolytica NOT DETECTED NOT DETECTED Final   Giardia lamblia NOT DETECTED NOT DETECTED Final   Adenovirus F40/41 NOT DETECTED NOT DETECTED Final   Astrovirus NOT DETECTED NOT DETECTED Final   Norovirus GI/GII NOT DETECTED NOT DETECTED Final   Rotavirus A NOT DETECTED NOT DETECTED Final   Sapovirus (I, II, IV, and V) NOT DETECTED NOT DETECTED Final    Comment: Performed at [facility], [address] [place], [place] [address]  C difficile quick scan w PCR reflex     Status: None   Collection Time: 04/12/19  3:36 AM   Specimen: Stool  Result Value Ref Range Status   C Diff antigen NEGATIVE NEGATIVE Final   C Diff toxin NEGATIVE NEGATIVE Final   C Diff interpretation No C. difficile detected.  Final    Comment: Performed at [facility], [place] [address], [place], [place] [address]    Radiology  Reports Ct Head Wo Contrast  Result Date: 04/11/2019 CLINICAL DATA:  Patient with headache for 3-4 days. Recently recovered from [place]. EXAM: CT HEAD WITHOUT CONTRAST TECHNIQUE: Contiguous axial images were obtained from the base of the skull through the vertex without intravenous contrast. COMPARISON:  Brain CT 09/02/2018 FINDINGS: Brain: Ventricles and sulci are prominent compatible with atrophy. Periventricular and subcortical white matter hypodensity compatible with chronic microvascular ischemic changes. No evidence for acute cortically based infarct, intracranial hemorrhage, mass lesion or mass-effect. Vascular: Unremarkable Skull: Intact. Sinuses/Orbits: Paranasal sinuses are well aerated. Mastoid air cells are unremarkable. Orbits are unremarkable. Other: None. IMPRESSION: No acute intracranial process. Atrophy and chronic microvascular ischemic changes. Electronically Signed   By: Lovey Newcomer M.D.   On: 04/11/2019 16:40   Ct Angio Chest Pe W Or Wo Contrast  Result Date: 04/12/2019 CLINICAL DATA:  Pt states that he has been short of breath and can't catch his breath Patient discharged form [facility] on 6/12, repeat covid test + history of PE EXAM: CT ANGIOGRAPHY CHEST WITH CONTRAST TECHNIQUE: Multidetector CT imaging of the chest was performed using the standard protocol during bolus administration of intravenous contrast. Multiplanar CT image reconstructions and MIPs were obtained to evaluate the vascular anatomy. CONTRAST:  74m OMNIPAQUE IOHEXOL 350 MG/ML SOLN COMPARISON:  03/19/2019 FINDINGS: Cardiovascular: The pulmonary arteries are well opacified and there is no acute pulmonary embolus. Status post CABG. Note is made of bovine arch anatomy. Aorta is tortuous but not aneurysmal. There is minimal atherosclerotic calcification of the thoracic arch. Ascending aorta is 3.2 centimeters. Mediastinum/Nodes: There is Conference will thickening of the thoracic esophagus throughout its course, without mass. No  mediastinal, axillary, or hilar adenopathy. The visualized portion of the thyroid gland has a normal appearance. Lungs/Pleura: There has been interval development of peripheral irregular ground-glass opacities primarily within the UPPER lobes but also involving the  LOWER lobes. There is stable RIGHT LOWER lobe atelectasis or scarring. Mild bronchial wall thickening. No pleural effusions or frank consolidations. Upper Abdomen: No acute abnormality. Musculoskeletal: Median sternotomy. Midthoracic spondylosis. Review of the MIP images confirms the above findings. IMPRESSION: 1. Technically adequate exam showing no acute pulmonary embolus. 2. Ascending thoracic aneurysm, 3.2 centimeters. Recommend annual imaging followup by CTA or MRA. This recommendation follows 2010 ACCF/AHA/AATS/ACR/ASA/SCA/SCAI/SIR/STS/SVM Guidelines for the Diagnosis and Management of Patients with Thoracic Aortic Disease. Circulation.2010; 121: X726-O035. Aortic aneurysm NOS (ICD10-I71.9) 3. Interval development of peripheral irregular ground-glass opacities primarily within the UPPER lobes. 4. Esophageal wall thickening, nonspecific but possibly related to esophagitis. Electronically Signed   By: Nolon Nations M.D.   On: 04/12/2019 10:09      Dg Chest Portable 1 View  Result Date: 04/11/2019 CLINICAL DATA:  Shortness of breath. EXAM: PORTABLE CHEST 1 VIEW COMPARISON:  Chest radiograph 03/19/2019 FINDINGS: Monitoring leads overlie the patient. Patient status post median sternotomy. Normal cardiac and mediastinal contours. No consolidative pulmonary opacities. No pleural effusion or pneumothorax. IMPRESSION: No acute cardiopulmonary process. Electronically Signed   By: Lovey Newcomer M.D.   On: 04/11/2019 15:54

## 2019-04-17 NOTE — Progress Notes (Signed)
Patient ambulated approximately 100 ft. He maintained sats >98% on room air. The patient used a walker, however he was still unsteady at times. He also c/o feeling week and dizzy.

## 2019-04-18 LAB — COMPREHENSIVE METABOLIC PANEL
ALT: 13 U/L (ref 0–44)
AST: 15 U/L (ref 15–41)
Albumin: 2.8 g/dL — ABNORMAL LOW (ref 3.5–5.0)
Alkaline Phosphatase: 44 U/L (ref 38–126)
Anion gap: 8 (ref 5–15)
BUN: 26 mg/dL — ABNORMAL HIGH (ref 8–23)
CO2: 22 mmol/L (ref 22–32)
Calcium: 8.3 mg/dL — ABNORMAL LOW (ref 8.9–10.3)
Chloride: 108 mmol/L (ref 98–111)
Creatinine, Ser: 1.41 mg/dL — ABNORMAL HIGH (ref 0.61–1.24)
GFR calc Af Amer: 59 mL/min — ABNORMAL LOW (ref >60)
GFR calc non Af Amer: 51 mL/min — ABNORMAL LOW (ref >60)
Glucose, Bld: 248 mg/dL — ABNORMAL HIGH (ref 70–99)
Potassium: 4.2 mmol/L (ref 3.5–5.1)
Sodium: 138 mmol/L (ref 135–145)
Total Bilirubin: <0.1 mg/dL — ABNORMAL LOW (ref 0.3–1.2)
Total Protein: 6.2 g/dL — ABNORMAL LOW (ref 6.5–8.1)

## 2019-04-18 LAB — CBC WITH DIFFERENTIAL/PLATELET
Abs Immature Granulocytes: 0.05 10*3/uL (ref 0.00–0.07)
Basophils Absolute: 0 10*3/uL (ref 0.0–0.1)
Basophils Relative: 0 %
Eosinophils Absolute: 0.1 10*3/uL (ref 0.0–0.5)
Eosinophils Relative: 2 %
HCT: 28 % — ABNORMAL LOW (ref 39.0–52.0)
Hemoglobin: 8.7 g/dL — ABNORMAL LOW (ref 13.0–17.0)
Immature Granulocytes: 1 %
Lymphocytes Relative: 31 %
Lymphs Abs: 1.1 10*3/uL (ref 0.7–4.0)
MCH: 25.7 pg — ABNORMAL LOW (ref 26.0–34.0)
MCHC: 31.1 g/dL (ref 30.0–36.0)
MCV: 82.6 fL (ref 80.0–100.0)
Monocytes Absolute: 0.3 10*3/uL (ref 0.1–1.0)
Monocytes Relative: 9 %
Neutro Abs: 2 10*3/uL (ref 1.7–7.7)
Neutrophils Relative %: 57 %
Platelets: 169 10*3/uL (ref 150–400)
RBC: 3.39 MIL/uL — ABNORMAL LOW (ref 4.22–5.81)
RDW: 13.5 % (ref 11.5–15.5)
WBC: 3.5 10*3/uL — ABNORMAL LOW (ref 4.0–10.5)
nRBC: 0 % (ref 0.0–0.2)

## 2019-04-18 LAB — GLUCOSE, CAPILLARY
Glucose-Capillary: 119 mg/dL — ABNORMAL HIGH (ref 70–99)
Glucose-Capillary: 179 mg/dL — ABNORMAL HIGH (ref 70–99)
Glucose-Capillary: 167 mg/dL — ABNORMAL HIGH (ref 70–99)
Glucose-Capillary: 213 mg/dL — ABNORMAL HIGH (ref 70–99)

## 2019-04-18 LAB — MAGNESIUM: Magnesium: 1.9 mg/dL (ref 1.7–2.4)

## 2019-04-18 LAB — C-REACTIVE PROTEIN: CRP: 0.9 mg/dL (ref <1.0)

## 2019-04-18 LAB — BRAIN NATRIURETIC PEPTIDE: B Natriuretic Peptide: 68.9 pg/mL (ref 0.0–100.0)

## 2019-04-18 MED ORDER — POLYETHYLENE GLYCOL 3350 17 G PO PACK
17.0000 g | PACK | Freq: Every day | ORAL | Status: DC | PRN
  Administered 2019-04-19 – 2019-04-20 (×2): 17 g via ORAL
  Filled 2019-04-18 (×4): qty 1

## 2019-04-18 NOTE — Progress Notes (Signed)
Patient declined to have family updated

## 2019-04-18 NOTE — Progress Notes (Signed)
Physical Therapy Treatment Patient Details Name: Michael [name] MRN: [id number] DOB: 1951-05-14 Today's Date: 04/18/2019    History of Present Illness 68 year old male with recent COVID-50 illness for which patient was admitted and discharged from the hospital about 2 weeks ago. Presenting with SOB, weakness, and fatigue. Repeat COVID-19 test was positive. Past medical history significant for pulmonary embolism, HTN, hyperlipidemia, HIV, gout, diabetes mellitus, depression, crack cocaine use, CAD, asthma and A-fib.     PT Comments    Pt continues to require assist with gait. Pt with extremely slow gait speed for age. Attempted to have pt speed up to attempt to smooth out gait but pt unable.    Follow Up Recommendations  SNF     Equipment Recommendations  Rolling walker with 5" wheels;3in1 (PT)    Recommendations for Other Services       Precautions / Restrictions Precautions Precautions: Fall Precaution Comments: slow gait speed and AD use puts him at higher risk for falls Restrictions Weight Bearing Restrictions: No    Mobility  Bed Mobility Overal bed mobility: Independent                Transfers Overall transfer level: Needs assistance Equipment used: Rolling walker (2 wheeled) Transfers: Sit to/from Stand Sit to Stand: Supervision;[name] assist         General transfer comment: supervision on initial sit to stand but [name] assist with repeated sit to stands from recliner  Ambulation/Gait Ambulation/Gait assistance: Supervision;[name] guard;[name] assist Gait Distance (Feet): 110 Feet Assistive device: Rolling walker (2 wheeled) Gait Pattern/deviations: Step-through pattern;Decreased step length - right;Decreased step length - left;Scissoring;Decreased dorsiflexion - right;Decreased dorsiflexion - left(Low clearance of feet on swing phase) Gait velocity: decreased   General Gait Details: Assist for safety and balance. Pt continues to need verbal cues to stay  closer to walker when turning. Pt scissoring feet when turning requiring [name] assist to correct. Attempted to have pt speed up his gait to create smoother gait pattern but pt unable    Stairs             Wheelchair Mobility    Modified Rankin (Stroke Patients Only)       Balance Overall balance assessment: Needs assistance Sitting-balance support: Feet supported;No upper extremity supported Sitting balance-Leahy Scale: Fair     Standing balance support: No upper extremity supported;During functional activity Standing balance-Leahy Scale: Fair Standing balance comment: supervision for static standing and assist and/or UE support for dynamic activities                            Cognition Arousal/Alertness: Awake/alert Behavior During Therapy: WFL for tasks assessed/performed Overall Cognitive Status: No family/caregiver present to determine baseline cognitive functioning Area of Impairment: Problem solving                             Problem Solving: Slow processing General Comments: Delayed responses      Exercises General Exercises - Lower Extremity Ankle Circles/Pumps: AROM;Both;10 reps;Seated Long Arc Quad: AROM;Both;10 reps;Seated Mini-Sqauts: Strengthening;Both;5 reps;Standing Other Exercises Other Exercises: repeated sit to stand from chair x 3    General Comments        Pertinent Vitals/Pain Pain Assessment: Faces Faces Pain Scale: No hurt    Home Living                      Prior Function  PT Goals (current goals can now be found in the care plan section) Acute Rehab PT Goals Patient Stated Goal: not stated Progress towards PT goals: Progressing toward goals    Frequency    [name] 3X/week      PT Plan Current plan remains appropriate;Discharge plan needs to be updated    Co-evaluation              AM-PAC PT "6 Clicks" Mobility   Outcome Measure  Help needed turning from your back to  your side while in a flat bed without using bedrails?: None Help needed moving from lying on your back to sitting on the side of a flat bed without using bedrails?: None Help needed moving to and from a bed to a chair (including a wheelchair)?: A Little Help needed standing up from a chair using your arms (e.g., wheelchair or bedside chair)?: A Little Help needed to walk in hospital room?: A Little Help needed climbing 3-5 steps with a railing? : A Lot 6 Click Score: 19    End of Session Equipment Utilized During Treatment: Gait belt Activity Tolerance: Patient limited by fatigue Patient left: with call bell/phone within reach;in chair   PT Visit Diagnosis: Unsteadiness on feet (R26.81);Difficulty in walking, not elsewhere classified (R26.2);Pain     Time: 6606-3016 PT Time Calculation ([name]) (ACUTE ONLY): 21 [name]  Charges:  $Gait Training: 8-22 mins                     [place] Pager [phone] Office [place] 04/18/2019, 10:34 AM

## 2019-04-18 NOTE — Progress Notes (Signed)
PROGRESS NOTE                                                                                                                                                                                                             Patient Demographics:    Michael [name], is a 68 y.o. male, DOB - 04-Feb-1951, BUY:[id number]  Outpatient Primary MD for the patient is Tawny Asal    LOS - 7  Admit date - 04/11/2019    Chief Complaint  Patient presents with  . Shortness of Breath       Brief Narrative  Patient is a 67 year old African American male with past medical history significant for recent COVID-19 illness for which patient was admitted and discharged from the hospital about 2 weeks ago, pulmonary embolism, hypertension, hyperlipidemia, HIV, gout, diabetes mellitus, depression, crack cocaine use, coronary artery disease, asthma and atrial fibrillation.Patient presents with new onset of shortness of breath, weakness and fatigue that started about 3 days ago. Patient also reports pain light headache, dizzy, with associated nausea, vomiting and diarrhea. Repeat COVID-19 test was positive and he was admitted for further care.    Subjective:   Patient in bed, appears comfortable, denies any headache, no fever, no chest pain or pressure, no shortness of breath , no abdominal pain. No focal weakness.  Much improved hiccups, heartburn along with almost completely resolved odynophagia.    Assessment  & Plan :    1.  Acute Covid 19 Viral Pneumonitis during the ongoing 2020 Covid 19 Pandemic - no hypoxia or oxygen need, stable inflammatory markers, conservative management with supportive care only.    COVID-19 Labs  Recent Labs    04/16/19 0445 04/17/19 0515 04/18/19 0330  CRP 0.8 1.2* 0.9    Lab Results  Component Value Date   SARSCOV2NAA POSITIVE (A) 04/11/2019   SARSCOV2NAA POSITIVE (A) 03/19/2019     Hepatic  Function Latest Ref Rng & Units 04/18/2019 04/17/2019 04/16/2019  Total Protein 6.5 - 8.1 g/dL 6.2(L) 6.1(L) 6.1(L)  Albumin 3.5 - 5.0 g/dL 2.8(L) 2.9(L) 2.8(L)  AST 15 - 41 U/L 15 14(L) 14(L)  ALT 0 - 44 U/L _0 Alk Phosphatase 38 - 126 U/L 44 48 52  Total Bilirubin 0.3 - 1.2 mg/dL <0.1(L) 0.2(L) 0.1(L)  Bilirubin, Direct 0.0 - 0.2 mg/dL - - -  Component Value Date/Time   BNP 68.9 04/18/2019 0330      2.  Poorly controlled DM type II without DKA.  Patient compliance with medications on Lantus and sliding scale.  Extremely poor outpatient control due to hyperglycemia.  Will provide diabetic and insulin education here prior to discharge.  Lab Results  Component Value Date   HGBA1C 13.2 (H) 04/15/2019   CBG (last 3)  Recent Labs    04/17/19 1653 04/17/19 2138 04/18/19 0849  GLUCAP 266* 218* 119*    3.  HIV.  Under the care of [name] Home medications continued.  4.  AKI on CKD stage III.  After hydration back to baseline.  5.  Generalized weakness and deconditioning.  Needs placement.  Social worker to assist.  6.  Esophagitis along with mild odynophagia.  Placed on nystatin along with PPI.  Soft diet, encouraged to sit up in chair rather than in bed, his symptoms have improved but he will definitely require outpatient GI follow-up and EGD.  7.  Intermittent hiccups likely due to #6 above.  PRN Thorazine along with treatment as stated in #6 above.  8.  Ongoing cocaine abuse.  Counseled to quit.    Condition - Fair  Family Communication  :  None  Code Status :  Full  Diet :   Diet Order            DIET SOFT Room service appropriate? Yes; Fluid consistency: Thin  Diet effective now        Diet - low sodium heart healthy               Disposition Plan  :  SNF versus hotel.  Discussed with social work on 04/15/2019 and 04/16/2019.  Encouraged the patient to ambulate with nursing staff today.  Consults  :  None  Procedures  :    CT head.   Nonacute.  CT chest. 1. Technically adequate exam showing no acute pulmonary embolus. 2. Ascending thoracic aneurysm, 3.2 centimeters. Recommend annual imaging followup by CTA or MRA. This recommendation follows 2010 ACCF/AHA/AATS/ACR/ASA/SCA/SCAI/SIR/STS/SVM Guidelines for the Diagnosis and Management of Patients with Thoracic Aortic Disease. Circulation.2010; 121: X902-I097. Aortic aneurysm NOS (ICD10-I71.9) 3. Interval development of peripheral irregular ground-glass opacities primarily within the UPPER lobes. 4. Esophageal wall thickening, nonspecific but possibly related to esophagitis.   PUD Prophylaxis :   DVT Prophylaxis  :   Heparin    Lab Results  Component Value Date   PLT 169 04/18/2019    Inpatient Medications  Scheduled Meds: . aspirin  81 mg Oral Daily  . emtricitabine-rilpivir-tenofovir AF  1 tablet Oral Q breakfast  . famotidine  20 mg Oral BID  . fluticasone  1 puff Inhalation BID  . heparin  5,000 Units Subcutaneous Q8H  . insulin aspart  0-15 Units Subcutaneous TID WC  . insulin aspart  0-5 Units Subcutaneous QHS  . insulin glargine  20 Units Subcutaneous BID  . nystatin  5 mL Oral QID  . ondansetron  4 mg Intravenous Once   Continuous Infusions: . chlorproMAZINE (THORAZINE) IV Stopped (04/14/19 1634)   PRN Meds:.alum & mag hydroxide-simeth, chlorproMAZINE (THORAZINE) IV, guaiFENesin-dextromethorphan, Ipratropium-Albuterol  Antibiotics  :    Anti-infectives (From admission, onward)   Start     Dose/Rate Route Frequency Ordered Stop   04/13/19 0800  emtricitabine-rilpivir-tenofovir AF (ODEFSEY) 200-25-25 MG per tablet 1 tablet     1 tablet Oral Daily with breakfast 04/12/19 0335  Time Spent in minutes  30   Lala Lund M.D on 04/18/2019 at 9:05 AM  To page go to [email or web address] - password [email or web address]  [facility] -  Office  [phone]     See all Orders from today for further details    Objective:   Vitals:   04/17/19 1241 04/17/19  1741 04/17/19 2140 04/18/19 0445  BP:  133/74    Pulse:      Resp:   18 18  Temp: 97.9 F (36.6 C)  99 F (37.2 C) 98.5 F (36.9 C)  TempSrc: Oral  Oral Oral  SpO2:  95%    Weight:      Height:        Wt Readings from Last 3 Encounters:  04/11/19 74.4 kg  03/19/19 62.6 kg  12/16/18 74.4 kg     Intake/Output Summary (Last 24 hours) at 04/18/2019 0905 Last data filed at 04/18/2019 0600 Gross per 24 hour  Intake 480 ml  Output 750 ml  Net -270 ml     Physical Exam  Awake Alert, Oriented X 3, No new F.N deficits, Normal affect [place].AT,PERRAL Supple Neck,No JVD, No cervical lymphadenopathy appriciated.  Symmetrical Chest wall movement, Good air movement bilaterally, CTAB RRR,No Gallops, Rubs or new Murmurs, No Parasternal Heave +ve B.Sounds, Abd Soft, No tenderness, No organomegaly appriciated, No rebound - guarding or rigidity. No Cyanosis, Clubbing or edema, No new Rash or bruise    Data Review:    CBC Recent Labs  Lab 04/11/19 1538 04/12/19 0600 04/16/19 0445 04/17/19 0515 04/18/19 0330  WBC 5.3 6.3 3.5* 4.2 3.5*  HGB 11.2* 11.2* 9.2* 9.1* 8.7*  HCT 35.2* 34.2* 29.4* 29.4* 28.0*  PLT 178 269 165 183 169  MCV 81.7 79.7* 82.4 82.1 82.6  MCH 26.0 26.1 25.8* 25.4* 25.7*  MCHC 31.8 32.7 31.3 31.0 31.1  RDW 13.2 13.3 13.2 13.4 13.5  LYMPHSABS  --   --  1.1 1.3 1.1  MONOABS  --   --  0.4 0.5 0.3  EOSABS  --   --  0.1 0.1 0.1  BASOSABS  --   --  0.0 0.0 0.0    Chemistries  Recent Labs  Lab 04/11/19 1538  04/12/19 1330 04/12/19 1840 04/16/19 0445 04/17/19 0515 04/18/19 0330  NA 129*   < > 139 139 139 139 138  K 4.0   < > 3.7 4.0 4.1 4.0 4.2  CL 97*   < > 105 105 106 106 108  CO2 20*   < > _0 GLUCOSE 591*   < > 204* 220* 199* 144* 248*  BUN 39*   < > 28* 26* 19 28* 26*  CREATININE 2.03*   < > 1.69* 1.64* 1.42* 1.42* 1.41*  CALCIUM 7.5*   < > 8.1* 8.1* 8.5* 8.4* 8.3*  MG  --    < > 1.9 1.9 1.8 1.9 1.9  AST 15  --   --   --  14* 14* 15   ALT 11  --   --   --  _1 ALKPHOS 72  --   --   --  52 48 44  BILITOT 0.7  --   --   --  0.1* 0.2* <0.1*   < > = values in this interval not displayed.   ------------------------------------------------------------------------------------------------------------------ No results for input(s): CHOL, HDL, LDLCALC, TRIG, CHOLHDL, LDLDIRECT in the last 72 hours.  Lab Results  Component Value Date   HGBA1C  13.2 (H) 04/15/2019   ------------------------------------------------------------------------------------------------------------------ No results for input(s): TSH, T4TOTAL, T3FREE, THYROIDAB in the last 72 hours.  Invalid input(s): FREET3  Cardiac Enzymes No results for input(s): CKMB, TROPONINI, MYOGLOBIN in the last 168 hours.  Invalid input(s): CK ------------------------------------------------------------------------------------------------------------------    Component Value Date/Time   BNP 68.9 04/18/2019 0330    Micro Results Recent Results (from the past 240 hour(s))  SARS Coronavirus 2 (CEPHEID- Performed in [place] hospital lab), Hosp Order     Status: Abnormal   Collection Time: 04/11/19  3:40 PM   Specimen: Nasopharyngeal Swab  Result Value Ref Range Status   SARS Coronavirus 2 POSITIVE (A) NEGATIVE Final    Comment: RESULT CALLED TO, READ BACK BY AND VERIFIED WITH: SIMPSON AT 1756ON 04/11/2019 BY JPM (NOTE) If result is NEGATIVE SARS-CoV-2 target nucleic acids are NOT DETECTED. The SARS-CoV-2 RNA is generally detectable in upper and lower  respiratory specimens during the acute phase of infection. The lowest  concentration of SARS-CoV-2 viral copies this assay can detect is 250  copies / mL. A negative result does not preclude SARS-CoV-2 infection  and should not be used as the sole basis for treatment or other  patient management decisions.  A negative result may occur with  improper specimen collection / handling, submission of specimen other   than nasopharyngeal swab, presence of viral mutation(s) within the  areas targeted by this assay, and inadequate number of viral copies  (<250 copies / mL). A negative result must be combined with clinical  observations, patient history, and epidemiological information. If result is POSITIVE SARS-CoV-2 target nucleic acids are DETECTED. The  SARS-CoV-2 RNA is generally detectable in upper and lower  respiratory specimens during the acute phase of infection.  Positive  results are indicative of active infection with SARS-CoV-2.  Clinical  correlation with patient history and other diagnostic information is  necessary to determine patient infection status.  Positive results do  not rule out bacterial infection or co-infection with other viruses. If result is PRESUMPTIVE POSTIVE SARS-CoV-2 nucleic acids MAY BE PRESENT.   A presumptive positive result was obtained on the submitted specimen  and confirmed on repeat testing.  While 2019 novel coronavirus  (SARS-CoV-2) nucleic acids may be present in the submitted sample  additional confirmatory testing may be necessary for epidemiological  and / or clinical management purposes  to differentiate between  SARS-CoV-2 and other Sarbecovirus currently known to infect humans.  If clinically indicated additional testing with an alternate test  methodology [phone]) is a dvised. The SARS-CoV-2 RNA is generally  detectable in upper and lower respiratory specimens during the acute  phase of infection. The expected result is Negative. Fact Sheet for Patients:  StrictlyIdeas.no Fact Sheet for Healthcare Providers: BankingDealers.co.za This test is not yet approved or cleared by the [place] FDA and has been authorized for detection and/or diagnosis of SARS-CoV-2 by FDA under an Emergency Use Authorization (EUA).  This EUA will remain in effect (meaning this test can be used) for the duration of  the COVID-19 declaration under Section 564(b)(1) of the Act, 21 U.S.C. section 360bbb-3(b)(1), unless the authorization is terminated or revoked sooner. Performed at [facility], [place] [address], [place], [place] [address]   Gastrointestinal Panel by PCR , Stool     Status: None   Collection Time: 04/12/19  3:36 AM   Specimen: Stool  Result Value Ref Range Status   Campylobacter species NOT DETECTED NOT DETECTED Final   Plesimonas shigelloides NOT  DETECTED NOT DETECTED Final   Salmonella species NOT DETECTED NOT DETECTED Final   Yersinia enterocolitica NOT DETECTED NOT DETECTED Final   Vibrio species NOT DETECTED NOT DETECTED Final   Vibrio cholerae NOT DETECTED NOT DETECTED Final   Enteroaggregative E coli (EAEC) NOT DETECTED NOT DETECTED Final   Enteropathogenic E coli (EPEC) NOT DETECTED NOT DETECTED Final   Enterotoxigenic E coli (ETEC) NOT DETECTED NOT DETECTED Final   Shiga like toxin producing E coli (STEC) NOT DETECTED NOT DETECTED Final   Shigella/Enteroinvasive E coli (EIEC) NOT DETECTED NOT DETECTED Final   Cryptosporidium NOT DETECTED NOT DETECTED Final   Cyclospora cayetanensis NOT DETECTED NOT DETECTED Final   Entamoeba histolytica NOT DETECTED NOT DETECTED Final   Giardia lamblia NOT DETECTED NOT DETECTED Final   Adenovirus F40/41 NOT DETECTED NOT DETECTED Final   Astrovirus NOT DETECTED NOT DETECTED Final   Norovirus GI/GII NOT DETECTED NOT DETECTED Final   Rotavirus A NOT DETECTED NOT DETECTED Final   Sapovirus (I, II, IV, and V) NOT DETECTED NOT DETECTED Final    Comment: Performed at [facility], [address] [place], [place] [address]  C difficile quick scan w PCR reflex     Status: None   Collection Time: 04/12/19  3:36 AM   Specimen: Stool  Result Value Ref Range Status   C Diff antigen NEGATIVE NEGATIVE Final   C Diff toxin NEGATIVE NEGATIVE Final   C Diff interpretation No C. difficile detected.  Final    Comment:  Performed at [facility], [place] [address], [place], [place] [address]    Radiology Reports Ct Head Wo Contrast  Result Date: 04/11/2019 CLINICAL DATA:  Patient with headache for 3-4 days. Recently recovered from [place]. EXAM: CT HEAD WITHOUT CONTRAST TECHNIQUE: Contiguous axial images were obtained from the base of the skull through the vertex without intravenous contrast. COMPARISON:  Brain CT 09/02/2018 FINDINGS: Brain: Ventricles and sulci are prominent compatible with atrophy. Periventricular and subcortical white matter hypodensity compatible with chronic microvascular ischemic changes. No evidence for acute cortically based infarct, intracranial hemorrhage, mass lesion or mass-effect. Vascular: Unremarkable Skull: Intact. Sinuses/Orbits: Paranasal sinuses are well aerated. Mastoid air cells are unremarkable. Orbits are unremarkable. Other: None. IMPRESSION: No acute intracranial process. Atrophy and chronic microvascular ischemic changes. Electronically Signed   By: Lovey Newcomer M.D.   On: 04/11/2019 16:40   Ct Angio Chest Pe W Or Wo Contrast  Result Date: 04/12/2019 CLINICAL DATA:  Pt states that he has been short of breath and can't catch his breath Patient discharged form [facility] on 6/12, repeat covid test + history of PE EXAM: CT ANGIOGRAPHY CHEST WITH CONTRAST TECHNIQUE: Multidetector CT imaging of the chest was performed using the standard protocol during bolus administration of intravenous contrast. Multiplanar CT image reconstructions and MIPs were obtained to evaluate the vascular anatomy. CONTRAST:  17m OMNIPAQUE IOHEXOL 350 MG/ML SOLN COMPARISON:  03/19/2019 FINDINGS: Cardiovascular: The pulmonary arteries are well opacified and there is no acute pulmonary embolus. Status post CABG. Note is made of bovine arch anatomy. Aorta is tortuous but not aneurysmal. There is minimal atherosclerotic calcification of the thoracic arch. Ascending aorta is 3.2 centimeters. Mediastinum/Nodes:  There is Conference will thickening of the thoracic esophagus throughout its course, without mass. No mediastinal, axillary, or hilar adenopathy. The visualized portion of the thyroid gland has a normal appearance. Lungs/Pleura: There has been interval development of peripheral irregular ground-glass opacities primarily within the UPPER lobes but also involving the LOWER  lobes. There is stable RIGHT LOWER lobe atelectasis or scarring. Mild bronchial wall thickening. No pleural effusions or frank consolidations. Upper Abdomen: No acute abnormality. Musculoskeletal: Median sternotomy. Midthoracic spondylosis. Review of the MIP images confirms the above findings. IMPRESSION: 1. Technically adequate exam showing no acute pulmonary embolus. 2. Ascending thoracic aneurysm, 3.2 centimeters. Recommend annual imaging followup by CTA or MRA. This recommendation follows 2010 ACCF/AHA/AATS/ACR/ASA/SCA/SCAI/SIR/STS/SVM Guidelines for the Diagnosis and Management of Patients with Thoracic Aortic Disease. Circulation.2010; 121: V747-B403. Aortic aneurysm NOS (ICD10-I71.9) 3. Interval development of peripheral irregular ground-glass opacities primarily within the UPPER lobes. 4. Esophageal wall thickening, nonspecific but possibly related to esophagitis. Electronically Signed   By: Nolon Nations M.D.   On: 04/12/2019 10:09      Dg Chest Portable 1 View  Result Date: 04/11/2019 CLINICAL DATA:  Shortness of breath. EXAM: PORTABLE CHEST 1 VIEW COMPARISON:  Chest radiograph 03/19/2019 FINDINGS: Monitoring leads overlie the patient. Patient status post median sternotomy. Normal cardiac and mediastinal contours. No consolidative pulmonary opacities. No pleural effusion or pneumothorax. IMPRESSION: No acute cardiopulmonary process. Electronically Signed   By: Lovey Newcomer M.D.   On: 04/11/2019 15:54

## 2019-04-19 LAB — COMPREHENSIVE METABOLIC PANEL
ALT: 12 U/L (ref 0–44)
AST: 15 U/L (ref 15–41)
Albumin: 3 g/dL — ABNORMAL LOW (ref 3.5–5.0)
Alkaline Phosphatase: 49 U/L (ref 38–126)
Anion gap: 8 (ref 5–15)
BUN: 27 mg/dL — ABNORMAL HIGH (ref 8–23)
CO2: 23 mmol/L (ref 22–32)
Calcium: 8.7 mg/dL — ABNORMAL LOW (ref 8.9–10.3)
Chloride: 109 mmol/L (ref 98–111)
Creatinine, Ser: 1.47 mg/dL — ABNORMAL HIGH (ref 0.61–1.24)
GFR calc Af Amer: 56 mL/min — ABNORMAL LOW (ref >60)
GFR calc non Af Amer: 48 mL/min — ABNORMAL LOW (ref >60)
Glucose, Bld: 194 mg/dL — ABNORMAL HIGH (ref 70–99)
Potassium: 4.5 mmol/L (ref 3.5–5.1)
Sodium: 140 mmol/L (ref 135–145)
Total Bilirubin: 0.1 mg/dL — ABNORMAL LOW (ref 0.3–1.2)
Total Protein: 6.5 g/dL (ref 6.5–8.1)

## 2019-04-19 LAB — GLUCOSE, CAPILLARY
Glucose-Capillary: 194 mg/dL — ABNORMAL HIGH (ref 70–99)
Glucose-Capillary: 98 mg/dL (ref 70–99)
Glucose-Capillary: 207 mg/dL — ABNORMAL HIGH (ref 70–99)
Glucose-Capillary: 198 mg/dL — ABNORMAL HIGH (ref 70–99)
Glucose-Capillary: 256 mg/dL — ABNORMAL HIGH (ref 70–99)

## 2019-04-19 LAB — CBC WITH DIFFERENTIAL/PLATELET
Abs Immature Granulocytes: 0.08 10*3/uL — ABNORMAL HIGH (ref 0.00–0.07)
Basophils Absolute: 0 10*3/uL (ref 0.0–0.1)
Basophils Relative: 0 %
Eosinophils Absolute: 0.1 10*3/uL (ref 0.0–0.5)
Eosinophils Relative: 2 %
HCT: 29.7 % — ABNORMAL LOW (ref 39.0–52.0)
Hemoglobin: 9.1 g/dL — ABNORMAL LOW (ref 13.0–17.0)
Immature Granulocytes: 2 %
Lymphocytes Relative: 29 %
Lymphs Abs: 1.3 10*3/uL (ref 0.7–4.0)
MCH: 25.6 pg — ABNORMAL LOW (ref 26.0–34.0)
MCHC: 30.6 g/dL (ref 30.0–36.0)
MCV: 83.7 fL (ref 80.0–100.0)
Monocytes Absolute: 0.4 10*3/uL (ref 0.1–1.0)
Monocytes Relative: 10 %
Neutro Abs: 2.7 10*3/uL (ref 1.7–7.7)
Neutrophils Relative %: 57 %
Platelets: 183 10*3/uL (ref 150–400)
RBC: 3.55 MIL/uL — ABNORMAL LOW (ref 4.22–5.81)
RDW: 13.5 % (ref 11.5–15.5)
WBC: 4.7 10*3/uL (ref 4.0–10.5)
nRBC: 0 % (ref 0.0–0.2)

## 2019-04-19 LAB — C-REACTIVE PROTEIN: CRP: 0.8 mg/dL (ref <1.0)

## 2019-04-19 LAB — BRAIN NATRIURETIC PEPTIDE: B Natriuretic Peptide: 76.1 pg/mL (ref 0.0–100.0)

## 2019-04-19 LAB — MAGNESIUM: Magnesium: 1.9 mg/dL (ref 1.7–2.4)

## 2019-04-19 MED ORDER — NYSTATIN 100000 UNIT/ML MT SUSP: 5.0000 mL | Freq: Four times a day (QID) | OROMUCOSAL | 0 refills | Status: AC

## 2019-04-19 MED ORDER — INSULIN GLARGINE 100 UNIT/ML ~~LOC~~ SOLN: 20.0000 [IU] | Freq: Every day | SUBCUTANEOUS | 0 refills | Status: DC

## 2019-04-19 MED ORDER — INSULIN ASPART 100 UNIT/ML ~~LOC~~ SOLN: SUBCUTANEOUS | 0 refills | Status: DC

## 2019-04-19 MED ORDER — BLOOD GLUCOSE MONITOR KIT: PACK | 1 refills | Status: DC

## 2019-04-19 MED ORDER — FAMOTIDINE 20 MG PO TABS: 20.0000 mg | ORAL_TABLET | Freq: Two times a day (BID) | ORAL | 1 refills | Status: DC

## 2019-04-19 NOTE — Progress Notes (Signed)
Dr Candiss Norse at bedside. Pt giving walker and ambulated to door with out assistance. Educated pt on importance of ambulating 4 times d a day. Pt verbalized understanding

## 2019-04-19 NOTE — Discharge Summary (Addendum)
Michael [name] GTX:[id number] DOB: 04-Nov-1950 DOA: 04/11/2019  PCP: [name], PA-C  Admit date: 04/11/2019  Discharge date: 04/20/2019  Admitted From: Home   Disposition:  Hotel   Recommendations for Outpatient Follow-up:   Follow up with PCP in 1-2 weeks  PCP Please obtain BMP/CBC, 2 view CXR in 1week,  (see Discharge instructions)   PCP Please follow up on the following pending results:    Home Health: PT,RN, S Work   Equipment/Devices: Chiropodist Consultations: None  Discharge Condition: Stable    CODE STATUS: Full    Diet Recommendation: Low carbohydrate diet.     Chief Complaint  Patient presents with  . Shortness of Breath     Brief history of present illness from the day of admission and additional interim summary    Patient is a 68 year old African American male with past medical history significant for recent COVID-19 illness for which patient was admitted and discharged from the hospital about 2 weeks ago, pulmonary embolism, hypertension, hyperlipidemia, HIV, gout, diabetes mellitus, depression, crack cocaine use, coronary artery disease, asthma and atrial fibrillation.Patient presents with new onset of shortness of breath, weakness and fatigue that started about 3 days ago. Patient also reports pain light headache, dizzy, with associated nausea, vomiting and diarrhea. Repeat COVID-19 test was positive and he was admitted for further care.                                                                  Hospital Course      1.  Acute Covid 19 Viral Pneumonitis during the ongoing 2020 Covid 19 Pandemic - no hypoxia or oxygen need, stable inflammatory markers, conservative management with supportive care only.  2.  Poorly controlled DM type II without DKA.  Patient  compliance with medications on Lantus and sliding scale.  Extremely poor outpatient control due to hyperglycemia.    He was provided with diabetic and insulin education here prior to discharge.  New prescriptions also provided along with testing supplies.  CBG (last 3)  Recent Labs    04/19/19 2139 04/20/19 0607 04/20/19 0734  GLUCAP 256* 156* 127*    3.  HIV.  Under the care of [name] Home medications continued.  4.  AKI on CKD stage III.  After hydration back to baseline.  5.  Generalized weakness and deconditioning.    Improved ambulated for me in the room by himself with the help of a walker, sat in the chair by himself, CNA witnessed, will be discharged per arrangements made by SNF with PT, RN and social work follow-up along with a 5 wheeled rolling walker, patient satisfied with the plan.  6.  Esophagitis along with mild odynophagia.  Placed on nystatin along with Pepcid.  Was initially placed on soft  diet now symptoms much better, advance to regular consistency low carbohydrate diet, encouraged to sit up in chair rather than in bed, his symptoms have improved but he will definitely require outpatient GI follow-up and EGD.  7.  Intermittent hiccups likely due to #6 above.  Resolved after treatment for #6 above and few doses of Thorazine.  8.  Ongoing cocaine abuse.  Counseled to quit.   Discharge diagnosis     Principal Problem:   Respiratory distress Active Problems:   Uncontrolled type 2 diabetes with neuropathy ([name])   Asthma, chronic   Chronic kidney disease, stage III (moderate) ([place])    Discharge instructions    Discharge Instructions    Call MD for:  persistant nausea and vomiting   Complete by: As directed    Call MD for:  temperature >100.4   Complete by: As directed    Discharge instructions   Complete by: As directed    Follow with Primary MD [name], PA-C in 7 days   Get CBC, CMP, 2 view Chest X ray -  checked next visit within 1  week by Primary MD   Activity: As tolerated with Full fall precautions use walker/cane & assistance as needed  Disposition Home    Diet: Low carbohydrate diet.  Accuchecks 4 times/day, Once in AM empty stomach and then before each meal. Log in all results and show them to your Prim.MD in 3 days. If any glucose reading is under 80 or above 300 call your Prim MD immidiately. Follow Low glucose instructions for glucose under 80 as instructed.    Special Instructions: If you have smoked or chewed Tobacco  in the last 2 yrs please stop smoking, stop any regular Alcohol  and or any Recreational drug use.  On your next visit with your primary care physician please Get Medicines reviewed and adjusted.  Please request your Prim.MD to go over all Hospital Tests and Procedure/Radiological results at the follow up, please get all Hospital records sent to your Prim MD by signing hospital release before you go home.  If you experience worsening of your admission symptoms, develop shortness of breath, life threatening emergency, suicidal or homicidal thoughts you must seek medical attention immediately by calling 911 or calling your MD immediately  if symptoms less severe.  You Must read complete instructions/literature along with all the possible adverse reactions/side effects for all the Medicines you take and that have been prescribed to you. Take any new Medicines after you have completely understood and accpet all the possible adverse reactions/side effects.   Increase activity slowly   Complete by: As directed       Discharge Medications   Allergies as of 04/20/2019   No Known Allergies     Medication List    STOP taking these medications   Accu-Chek Softclix Lancets lancets   insulin lispro 100 UNIT/ML injection Commonly known as: HumaLOG     TAKE these medications   albuterol 108 (90 Base) MCG/ACT inhaler Commonly known as: ProAir HFA INHALE TWO PUFFS INTO THE LUNGS EVERY 6  (SIX) HOURS AS NEEDED FOR WHEEZING OR SHORTNESS OF BREATH. What changed:   how much to take  how to take this  when to take this  reasons to take this  additional instructions Notes to patient: Resume home schedule 04/12/2019   allopurinol 300 MG tablet Commonly known as: ZYLOPRIM Take 1 tablet (300 mg total) by mouth daily. For gout. Notes to patient: Resume home schedule 04/12/2019  aspirin 81 MG chewable tablet Chew 81 mg by mouth daily.   benzonatate 100 MG capsule Commonly known as: TESSALON Take 2 capsules (200 mg total) by mouth 3 (three) times daily as needed for cough. Notes to patient: Resume home schedule 04/12/2019   blood glucose meter kit and supplies Kit Dispense based on patient and insurance preference. Use up to four times daily as directed. (FOR ICD-9 250.00, 250.01). For QAC - HS accuchecks.   emtricitabine-rilpivir-tenofovir AF 200-25-25 MG Tabs tablet Commonly known as: ODEFSEY Take 1 tablet by mouth daily with breakfast. Notes to patient: Resume home schedule 04/12/2019   famotidine 20 MG tablet Commonly known as: PEPCID Take 1 tablet (20 mg total) by mouth 2 (two) times daily.   Flovent Diskus 100 MCG/BLIST Aepb Generic drug: Fluticasone Propionate (Inhal) Inhale 1 puff into the lungs 2 (two) times daily. Notes to patient: Resume home schedule 04/12/2019   gabapentin 400 MG capsule Commonly known as: NEURONTIN Take 1 capsule (400 mg total) by mouth 2 (two) times daily. For agitation/neuropathic pain Notes to patient: Resume home schedule 04/12/2019   guaiFENesin 600 MG 12 hr tablet Commonly known as: MUCINEX Take 1 tablet (600 mg total) by mouth 2 (two) times daily. What changed:   when to take this  reasons to take this Notes to patient: Resume home schedule 04/12/2019   hydrOXYzine 25 MG tablet Commonly known as: ATARAX/VISTARIL Take 1 tablet (25 mg total) by mouth 3 (three) times daily as needed for anxiety. Notes to patient: Resume home  schedule 04/12/2019   insulin aspart 100 UNIT/ML injection Commonly known as: NovoLOG Before each meal 3 times a day , 140-199 - 2 units, 200-250 - 4 units, 251-299 - 6 units,  300-349 - 8 units,  350 or above 10 units. Insulin PEN if approved, provide syringes and needles if needed. Can switch to any other cheaper alternative.   insulin glargine 100 UNIT/ML injection Commonly known as: Lantus Inject 0.2 mLs (20 Units total) into the skin at bedtime. Dispense insulin pen if approved, if not dispense as needed syringes and needles for 1 month supply. Can switch to Levemir. Diagnosis E 11.65. What changed:   how much to take  additional instructions   Insulin Syringe-Needle U-100 25G X 1" 1 ML Misc For 4 times a day insulin SQ, 1 month supply. Diagnosis E11.65 What changed:   medication strength  how much to take  how to take this  when to take this  reasons to take this  additional instructions   metFORMIN 1000 MG tablet Commonly known as: GLUCOPHAGE Take 1,000 mg by mouth 2 (two) times daily. Notes to patient: Resume home schedule 04/12/2019   nystatin 100000 UNIT/ML suspension Commonly known as: MYCOSTATIN Take 5 mLs (500,000 Units total) by mouth 4 (four) times daily for 7 days.   sertraline 50 MG tablet Commonly known as: ZOLOFT Take 1 tablet (50 mg total) by mouth daily. Notes to patient: Resume home schedule 04/12/2019   traZODone 50 MG tablet Commonly known as: DESYREL Take 1 tablet (50 mg total) by mouth at bedtime as needed for sleep.            Durable Medical Equipment  (From admission, onward)         Start     Ordered   04/20/19 0948  For home use only DME Cane  Once     04/20/19 0947   04/19/19 0936  For home use only DME Walker rolling  Once  Comments: 5 wheel  Question:  Patient needs a walker to treat with the following condition  Answer:  Weakness   04/19/19 0935          Follow-up Information    [name], PA-C. Schedule  an appointment as soon as possible for a visit in 1 week(s).   Specialty: Physician Assistant Contact information: [place] [address] [phone]        [name], MD .   Specialty: Infectious Diseases Contact information: [address] [place] [address] [place]  [address] [phone]        Wonda Horner, MD. Schedule an appointment as soon as possible for a visit in 1 week(s).   Specialty: Gastroenterology Why: Severe esophagitis Contact information: [address] [place] [address] [phone]           Major procedures and Radiology Reports - PLEASE review detailed and final reports thoroughly  -       Ct Head Wo Contrast  Result Date: 04/11/2019 CLINICAL DATA:  Patient with headache for 3-4 days. Recently recovered from [place]. EXAM: CT HEAD WITHOUT CONTRAST TECHNIQUE: Contiguous axial images were obtained from the base of the skull through the vertex without intravenous contrast. COMPARISON:  Brain CT 09/02/2018 FINDINGS: Brain: Ventricles and sulci are prominent compatible with atrophy. Periventricular and subcortical white matter hypodensity compatible with chronic microvascular ischemic changes. No evidence for acute cortically based infarct, intracranial hemorrhage, mass lesion or mass-effect. Vascular: Unremarkable Skull: Intact. Sinuses/Orbits: Paranasal sinuses are well aerated. Mastoid air cells are unremarkable. Orbits are unremarkable. Other: None. IMPRESSION: No acute intracranial process. Atrophy and chronic microvascular ischemic changes. Electronically Signed   By: Lovey Newcomer M.D.   On: 04/11/2019 16:40   Ct Angio Chest Pe W Or Wo Contrast  Result Date: 04/12/2019 CLINICAL DATA:  Pt states that he has been short of breath and can't catch his breath Patient discharged form [facility] on 6/12, repeat covid test + history of PE EXAM: CT ANGIOGRAPHY CHEST WITH CONTRAST TECHNIQUE: Multidetector CT imaging of the chest was  performed using the standard protocol during bolus administration of intravenous contrast. Multiplanar CT image reconstructions and MIPs were obtained to evaluate the vascular anatomy. CONTRAST:  39m OMNIPAQUE IOHEXOL 350 MG/ML SOLN COMPARISON:  03/19/2019 FINDINGS: Cardiovascular: The pulmonary arteries are well opacified and there is no acute pulmonary embolus. Status post CABG. Note is made of bovine arch anatomy. Aorta is tortuous but not aneurysmal. There is minimal atherosclerotic calcification of the thoracic arch. Ascending aorta is 3.2 centimeters. Mediastinum/Nodes: There is Conference will thickening of the thoracic esophagus throughout its course, without mass. No mediastinal, axillary, or hilar adenopathy. The visualized portion of the thyroid gland has a normal appearance. Lungs/Pleura: There has been interval development of peripheral irregular ground-glass opacities primarily within the UPPER lobes but also involving the LOWER lobes. There is stable RIGHT LOWER lobe atelectasis or scarring. Mild bronchial wall thickening. No pleural effusions or frank consolidations. Upper Abdomen: No acute abnormality. Musculoskeletal: Median sternotomy. Midthoracic spondylosis. Review of the MIP images confirms the above findings. IMPRESSION: 1. Technically adequate exam showing no acute pulmonary embolus. 2. Ascending thoracic aneurysm, 3.2 centimeters. Recommend annual imaging followup by CTA or MRA. This recommendation follows 2010 ACCF/AHA/AATS/ACR/ASA/SCA/SCAI/SIR/STS/SVM Guidelines for the Diagnosis and Management of Patients with Thoracic Aortic Disease. Circulation.2010; 121:: D220-U542 Aortic aneurysm NOS (ICD10-I71.9) 3. Interval development of peripheral irregular ground-glass opacities primarily within the UPPER lobes. 4. Esophageal wall thickening, nonspecific  but possibly related to esophagitis. Electronically Signed   By: Nolon Nations M.D.   On: 04/12/2019 10:09   Dg Chest Portable 1  View  Result Date: 04/11/2019 CLINICAL DATA:  Shortness of breath. EXAM: PORTABLE CHEST 1 VIEW COMPARISON:  Chest radiograph 03/19/2019 FINDINGS: Monitoring leads overlie the patient. Patient status post median sternotomy. Normal cardiac and mediastinal contours. No consolidative pulmonary opacities. No pleural effusion or pneumothorax. IMPRESSION: No acute cardiopulmonary process. Electronically Signed   By: Lovey Newcomer M.D.   On: 04/11/2019 15:54    Micro Results     Recent Results (from the past 240 hour(s))  SARS Coronavirus 2 (CEPHEID- Performed in [facility] hospital lab), Hosp Order     Status: Abnormal   Collection Time: 04/11/19  3:40 PM   Specimen: Nasopharyngeal Swab  Result Value Ref Range Status   SARS Coronavirus 2 POSITIVE (A) NEGATIVE Final    Comment: RESULT CALLED TO, READ BACK BY AND VERIFIED WITH: SIMPSON AT 1756ON 04/11/2019 BY JPM (NOTE) If result is NEGATIVE SARS-CoV-2 target nucleic acids are NOT DETECTED. The SARS-CoV-2 RNA is generally detectable in upper and lower  respiratory specimens during the acute phase of infection. The lowest  concentration of SARS-CoV-2 viral copies this assay can detect is 250  copies / mL. A negative result does not preclude SARS-CoV-2 infection  and should not be used as the sole basis for treatment or other  patient management decisions.  A negative result may occur with  improper specimen collection / handling, submission of specimen other  than nasopharyngeal swab, presence of viral mutation(s) within the  areas targeted by this assay, and inadequate number of viral copies  (<250 copies / mL). A negative result must be combined with clinical  observations, patient history, and epidemiological information. If result is POSITIVE SARS-CoV-2 target nucleic acids are DETECTED. The  SARS-CoV-2 RNA is generally detectable in upper and lower  respiratory specimens during the acute phase of infection.  Positive  results are indicative  of active infection with SARS-CoV-2.  Clinical  correlation with patient history and other diagnostic information is  necessary to determine patient infection status.  Positive results do  not rule out bacterial infection or co-infection with other viruses. If result is PRESUMPTIVE POSTIVE SARS-CoV-2 nucleic acids MAY BE PRESENT.   A presumptive positive result was obtained on the submitted specimen  and confirmed on repeat testing.  While 2019 novel coronavirus  (SARS-CoV-2) nucleic acids may be present in the submitted sample  additional confirmatory testing may be necessary for epidemiological  and / or clinical management purposes  to differentiate between  SARS-CoV-2 and other Sarbecovirus currently known to infect humans.  If clinically indicated additional testing with an alternate test  methodology [phone]) is a dvised. The SARS-CoV-2 RNA is generally  detectable in upper and lower respiratory specimens during the acute  phase of infection. The expected result is Negative. Fact Sheet for Patients:  StrictlyIdeas.no Fact Sheet for Healthcare Providers: BankingDealers.co.za This test is not yet approved or cleared by the [place] FDA and has been authorized for detection and/or diagnosis of SARS-CoV-2 by FDA under an Emergency Use Authorization (EUA).  This EUA will remain in effect (meaning this test can be used) for the duration of the COVID-19 declaration under Section 564(b)(1) of the Act, 21 U.S.C. section 360bbb-3(b)(1), unless the authorization is terminated or revoked sooner. Performed at [facility], [place] [address] [place], [place] [address]   Gastrointestinal Panel by PCR ,  Stool     Status: None   Collection Time: 04/12/19  3:36 AM   Specimen: Stool  Result Value Ref Range Status   Campylobacter species NOT DETECTED NOT DETECTED Final   Plesimonas shigelloides NOT DETECTED NOT DETECTED Final    Salmonella species NOT DETECTED NOT DETECTED Final   Yersinia enterocolitica NOT DETECTED NOT DETECTED Final   Vibrio species NOT DETECTED NOT DETECTED Final   Vibrio cholerae NOT DETECTED NOT DETECTED Final   Enteroaggregative E coli (EAEC) NOT DETECTED NOT DETECTED Final   Enteropathogenic E coli (EPEC) NOT DETECTED NOT DETECTED Final   Enterotoxigenic E coli (ETEC) NOT DETECTED NOT DETECTED Final   Shiga like toxin producing E coli (STEC) NOT DETECTED NOT DETECTED Final   Shigella/Enteroinvasive E coli (EIEC) NOT DETECTED NOT DETECTED Final   Cryptosporidium NOT DETECTED NOT DETECTED Final   Cyclospora cayetanensis NOT DETECTED NOT DETECTED Final   Entamoeba histolytica NOT DETECTED NOT DETECTED Final   Giardia lamblia NOT DETECTED NOT DETECTED Final   Adenovirus F40/41 NOT DETECTED NOT DETECTED Final   Astrovirus NOT DETECTED NOT DETECTED Final   Norovirus GI/GII NOT DETECTED NOT DETECTED Final   Rotavirus A NOT DETECTED NOT DETECTED Final   Sapovirus (I, II, IV, and V) NOT DETECTED NOT DETECTED Final    Comment: Performed at [facility], [address] [place], [place] [address]  C difficile quick scan w PCR reflex     Status: None   Collection Time: 04/12/19  3:36 AM   Specimen: Stool  Result Value Ref Range Status   C Diff antigen NEGATIVE NEGATIVE Final   C Diff toxin NEGATIVE NEGATIVE Final   C Diff interpretation No C. difficile detected.  Final    Comment: Performed at [facility], [place] [address], [place], [place] [address]    Today   Subjective    Daesean Lazarz today has no headache,no chest abdominal pain,no new weakness tingling or numbness, feels much better wants to go home today.     Objective   Blood pressure 113/65, pulse 85, temperature 98 F (36.7 C), temperature source Tympanic, resp. rate 19, height _0  (1.702 m), weight 74.4 kg, SpO2 99 %.   Intake/Output Summary (Last 24 hours) at 04/20/2019 0947 Last data filed  at 04/20/2019 0600 Gross per 24 hour  Intake 960 ml  Output 1000 ml  Net -40 ml    Exam  Awake Alert, Oriented x 3, No new F.N deficits, Normal affect .AT,PERRAL Supple Neck,No JVD, No cervical lymphadenopathy appriciated.  Symmetrical Chest wall movement, Good air movement bilaterally, CTAB RRR,No Gallops,Rubs or new Murmurs, No Parasternal Heave +ve B.Sounds, Abd Soft, Non tender, No organomegaly appriciated, No rebound -guarding or rigidity. No Cyanosis, Clubbing or edema, No new Rash or bruise   Data Review   CBC w Diff:  Lab Results  Component Value Date   WBC 4.7 04/19/2019   HGB 9.1 (L) 04/19/2019   HGB 11.4 (L) 12/26/2016   HCT 29.7 (L) 04/19/2019   HCT 34.8 (L) 12/26/2016   PLT 183 04/19/2019   LYMPHOPCT 29 04/19/2019   MONOPCT 10 04/19/2019   EOSPCT 2 04/19/2019   BASOPCT 0 04/19/2019    CMP:  Lab Results  Component Value Date   NA 140 04/19/2019   NA 140 12/26/2016   K 4.5 04/19/2019   CL 109 04/19/2019   CO2 23 04/19/2019   BUN 27 (H) 04/19/2019   BUN 15 12/26/2016   CREATININE 1.47 (H)  04/19/2019   CREATININE 1.52 (H) 10/14/2017   PROT 6.5 04/19/2019   PROT 7.1 12/26/2016   ALBUMIN 3.0 (L) 04/19/2019   ALBUMIN 3.9 12/26/2016   BILITOT 0.1 (L) 04/19/2019   BILITOT <0.2 12/26/2016   ALKPHOS 49 04/19/2019   AST 15 04/19/2019   ALT 12 04/19/2019  . COVID-19 Labs  Recent Labs    04/18/19 0330 04/19/19 0130  CRP 0.9 0.8    Lab Results  Component Value Date   SARSCOV2NAA POSITIVE (A) 04/11/2019   SARSCOV2NAA POSITIVE (A) 03/19/2019    Lab Results  Component Value Date   HGBA1C 13.2 (H) 04/15/2019     Total Time in preparing paper work, data evaluation and todays exam - 39 minutes  Lala Lund M.D on 04/20/2019 at 9:47 AM  [facility]   Office  [phone]

## 2019-04-19 NOTE — TOC Initial Note (Signed)
Transition of Care [facility]) - Initial/Assessment Note    Patient Details  Name: Michael [name] MRN: [id number] Date of Birth: 1950/11/28  Transition of Care [facility]) CM/SW Contact:    Weston Anna, LCSW Phone Number: 04/19/2019, 2:44 PM  Clinical Narrative:                   CSW spoke with patient via phone to discuss disposition plans- patient is currently covid + and will be using a hotel voucher provided by the health department. Patient is agreeable to go to [facility] once completed his 14 days at the hotel and shelter has already agreed to accept him.   Patient will be needing a walker/ HH (per dc summary)/ and has a soft diet. CSW is questioning the health department if a soft diet can be provided for the patient at this time and will continue to follow up.   Expected Discharge Plan: Homeless Shelter Barriers to Discharge: Homeless with medical needs, Other (comment)(Needng hotel voucher)   Patient Goals and CMS Choice        Expected Discharge Plan and Services Expected Discharge Plan: [place]         Expected Discharge Date: 04/19/19                                    Prior Living Arrangements/Services                       Activities of Daily Living Home Assistive Devices/Equipment: None ADL Screening (condition at time of admission) Patient's cognitive ability adequate to safely complete daily activities?: Yes Is the patient deaf or have difficulty hearing?: No Does the patient have difficulty seeing, even when wearing glasses/contacts?: No Does the patient have difficulty concentrating, remembering, or making decisions?: No Patient able to express need for assistance with ADLs?: Yes Does the patient have difficulty dressing or bathing?: No Independently performs ADLs?: Yes (appropriate for developmental age) Does the patient have difficulty walking or climbing stairs?: No Weakness of Legs: None Weakness of Arms/Hands:  None  Permission Sought/Granted                  Emotional Assessment       Orientation: : Oriented to Self, Oriented to Place, Oriented to  Time, Oriented to Situation   Psych Involvement: No (comment)  Admission diagnosis:  Hyperglycemia [R73.9] AKI (acute kidney injury) ([place]) [N17.9] COVID-19 [U07.1, J98.8] Patient Active Problem List   Diagnosis Date Noted  . Respiratory distress 04/12/2019  . Pneumonia due to severe acute respiratory syndrome coronavirus 2 (SARS-CoV-2) 04/11/2019  . COVID-19 virus infection 03/19/2019  . Chest pain 10/03/2018  . Malnutrition of moderate degree 09/21/2018  . MDD (major depressive disorder), recurrent episode, moderate ([place])   . Type II diabetes mellitus with renal manifestations ([place]) 05/04/2018  . GERD (gastroesophageal reflux disease) 05/04/2018  . S/P CABG (coronary artery bypass graft)   . Left testicular pain   . Depression 02/20/2018  . Epididymo-orchitis, acute 02/20/2018  . Essential hypertension 02/20/2018  . Precordial chest pain 02/20/2018  . AKI (acute kidney injury) ([place]) 02/14/2018  . HCAP (healthcare-associated pneumonia) 02/14/2018  . History of pulmonary embolism 07/04/2017  . Acute hyponatremia 05/11/2017  . Hyperglycemia due to type 2 diabetes mellitus ([place]) 05/11/2017  . CHF (congestive heart failure) ([place]) 04/01/2017  . Hepatitis C 12/30/2016  . Chronic ischemic  heart disease 11/12/2016  . Paroxysmal A-fib ([place]) 11/12/2016  . Back pain 04/18/2016  . S/P carotid endarterectomy 11/15/2015  . MDD (major depressive disorder), recurrent severe, without psychosis ([place]) 09/09/2015  . Cocaine-induced mood disorder ([place]) 08/14/2015  . Cocaine abuse with cocaine-induced mood disorder ([place]) 08/14/2015  . Gout 07/10/2015  . Acute renal failure superimposed on stage 3 chronic kidney disease ([place]) 03/06/2015  . Normocytic anemia 03/06/2015  . Chronic kidney disease, stage III (moderate) ([place]) 03/06/2015  .  Hypoglycemia   . Encounter for general adult medical examination with abnormal findings 02/09/2015  . Cocaine use disorder, severe, dependence ([place]) 12/13/2014  . Substance or medication-induced depressive disorder with onset during withdrawal ([place]) 12/13/2014  . Severe recurrent major depressive disorder with psychotic features ([place]) 12/12/2014  . Cervicalgia 06/28/2014  . Lumbar radiculopathy, chronic 06/28/2014  . Asthma, chronic 02/03/2014  . 3-vessel CAD 06/24/2013  . ED (erectile dysfunction) of organic origin 07/07/2012  . Hypertension goal BP (blood pressure) < 140/80 04/29/2012  . Chondromalacia of left knee 03/19/2012  . Hyperlipidemia with target LDL less than 100 02/12/2012  . Fibromyalgia 02/12/2012  . HIV (human immunodeficiency virus infection) ([place]) 08/27/2011  . Uncontrolled type 2 diabetes with neuropathy ([place]) 10/17/2000   PCP:  [name], PA-C Pharmacy:   [place], [place] [address] [place] [address] Phone: [phone] Fax: [place], [place] [address] [place] [address] Phone: [phone] Fax: [phone]  [place], [place] [address] Phone: [phone] Fax: [phone]     Social Determinants of Health (SDOH) Interventions    Readmission Risk Interventions No flowsheet data found.

## 2019-04-19 NOTE — Progress Notes (Signed)
COVID-19 ISOLATION REFERRAL   HOTEL VOUCHERS FOR THE HOMELESS   CHECKLIST   Y/N Yes COVID-19 TESTED?  DATE OF TEST___7/5______________  Yes ISOLATION ORDER PLACED  Yes INDEPENDENT OF ADL?  Yes AMBULATORY? Walks with a cane/ walker   Yes DIETARY RESTRICTIONS? Soft diet- notified Health Department   No MEDICATIONS OBTAINED THROUGH TOC PHARMACY? Working on it   No CONTACT ZACK BROOKS CSW AD [phone] TO INITIATE REFERRAL PROCCESS WITH [place] [facility] NURSE  No ISOLATION RULES/FORM REVIEWED AND UNDERSTOOD BY THE PATIENT (SENT BY ZACK)  No ISOLATION FORM RETURNED TO ZACK (SCANNED/EMAILED)  No HOTEL NAME AND ADDRESS PROVIDED BY ZACK  No TRANSPORTATION ARRANGED [phone])

## 2019-04-19 NOTE — Progress Notes (Signed)
Occupational Therapy Treatment Patient Details Name: Michael [name] MRN: [id number] DOB: [birth year] Today's Date: 04/19/2019    History of present illness 68 year old male with recent COVID-52 illness for which patient was admitted and discharged from the hospital about 2 weeks ago. Presenting with SOB, weakness, and fatigue. Repeat COVID-19 test was positive. Past medical history significant for pulmonary embolism, HTN, hyperlipidemia, HIV, gout, diabetes mellitus, depression, crack cocaine use, CAD, asthma and A-fib.    OT comments  Administered Pill Box Test to assess executive functioning. Pt failed the assessment, demonstrating short term memory, problem solving, poor planning, mental flexibility, suboptimal search strategies, concrete thinking and the inability to multitask. Pt had a total of 12 errors, where more than 3 errors is considered a fail. Pt with decreased awareness of errors and required max cues to locate errors and then correct. Discussed need for increased assistance with IADLs. Pt reporting he has had a HHRN before for medication management and is agreeable to resuming this to optimize safety.   Errors: One tablet 2x/day with breakfast and dinner (green) - 2 errors (additions)             Placed an additional pill in Wednesday in noon slot and Thursday in morning slot. One tablet every other day (red) - 10 errors (additions)            After completing "every other day" in morning, continued to alternate placing pills into noon, evening, and bedtime.  Number of total errors (sum of omissions; misplacements) - 12 Total time to complete task (allowed 5 min) - 20 min  Continue to recommend dc to SNF for further OT to optimize independence and safety with ADLs, IADLs, and functional mobility.   Follow Up Recommendations  SNF;Supervision/Assistance - 24 hour(If DC to hotel, recommend [place] [facility] [place] RN)    Equipment Recommendations  3 in 1 bedside commode     Recommendations for Other Services PT consult    Precautions / Restrictions Precautions Precautions: Fall Restrictions Weight Bearing Restrictions: No       Mobility Bed Mobility               General bed mobility comments: OOB in chair  Transfers                      Balance                                           ADL either performed or assessed with clinical judgement   ADL Overall ADL's : Needs assistance/impaired                                       General ADL Comments: Pt agreeable to perform Pill Box Test. Pt demonstrating decreased ST memory, problem solving, processing speed, error recognition, and awareness. Pt initiating task by performing all of Sunday (correctly), pt then looking at OT and asking if he is finished. Provided single questioning cue as to if he completed the task for a week's worth of medication. Pt then returning to task ti fill the entire pill box. Pt with errors at "breakfast and dinner" as well as "every other day". Pt placing a pill alteranting in morning time (following instructions correctly) and then moving to alternate pills at noond,  evening, and bedtime time slots. Pt without error recognition and requiring Max cues to fix error.      Vision   Vision Assessment?: Vision impaired- to be further tested in functional context Additional Comments: Pt without his reading glasses and requiring increased effort to read labels on pill bottles   Perception     Praxis      Cognition Arousal/Alertness: Awake/alert Behavior During Therapy: WFL for tasks assessed/performed Overall Cognitive Status: No family/caregiver present to determine baseline cognitive functioning Area of Impairment: Problem solving                 Orientation Level: Time Current Attention Level: Sustained Memory: Decreased short-term memory     Awareness: Emergent Problem Solving: Slow processing General  Comments: Administered the Pill Box Test to assess problem solving, following written instructions, ST memory, and executive functioning. Pt demonstrating decreased ST memory, error recognition, and awareness. Pt also requiring significant amont of time to complete task.        Exercises     Shoulder Instructions       General Comments Discussed possible need for increased assistance with IADLs including medication management, meal prep, and money management. Pt reporting he has had an RN in the past who assisted with medication    Pertinent Vitals/ Pain       Pain Assessment: Faces Faces Pain Scale: No hurt Pain Intervention(s): Monitored during session  Home Living                                          Prior Functioning/Environment              Frequency  Min 3X/week        Progress Toward Goals  OT Goals(current goals can now be found in the care plan section)  Progress towards OT goals: Progressing toward goals  Acute Rehab OT Goals Patient Stated Goal: not stated OT Goal Formulation: With patient Time For Goal Achievement: 04/27/19 Potential to Achieve Goals: Good ADL Goals Pt Will Perform Grooming: with modified independence;standing Pt Will Perform Lower Body Dressing: with modified independence;sit to/from stand Pt Will Transfer to Toilet: with modified independence;ambulating;regular height toilet Pt Will Perform Toileting - Clothing Manipulation and hygiene: with modified independence;sitting/lateral leans;sit to/from stand Additional ADL Goal #1: Pt will demonstrate increased activity tolerance to perform three ADLs in standing with supervision  Plan Discharge plan remains appropriate;Frequency needs to be updated    Co-evaluation                 AM-PAC OT "6 Clicks" Daily Activity     Outcome Measure   Help from another person eating meals?: None Help from another person taking care of personal grooming?: None Help  from another person toileting, which includes using toliet, bedpan, or urinal?: A Little Help from another person bathing (including washing, rinsing, drying)?: A Little Help from another person to put on and taking off regular upper body clothing?: A Little Help from another person to put on and taking off regular lower body clothing?: A Little 6 Click Score: 20    End of Session    OT Visit Diagnosis: Unsteadiness on feet (R26.81);Other abnormalities of gait and mobility (R26.89);Muscle weakness (generalized) (M62.81)   Activity Tolerance Patient tolerated treatment well   Patient Left in chair;with call bell/phone within reach;with chair alarm set   Nurse Communication Mobility  status        Time: 1013-1105 OT Time Calculation (min): 52 min  Charges: OT General Charges $OT Visit: 1 Visit OT Treatments $Self Care/Home Management : 8-22 mins $Cognitive Funtion inital: Initial 15 mins $Cognitive Funtion additional: Additional15 mins  Rembert Browe MSOT, OTR/L Acute Rehab Pager: [phone] Office: [place] 04/19/2019, 11:44 AM

## 2019-04-19 NOTE — Progress Notes (Signed)
Physical Therapy Treatment Patient Details Name: Michael [name] MRN: [id number] DOB: [birth year] Today's Date: 04/19/2019    History of Present Illness 68 year old male with recent COVID-36 illness for which patient was admitted and discharged from the hospital about 2 weeks ago. Presenting with SOB, weakness, and fatigue. Repeat COVID-19 test was positive. Past medical history significant for pulmonary embolism, HTN, hyperlipidemia, HIV, gout, diabetes mellitus, depression, crack cocaine use, CAD, asthma and A-fib.     PT Comments    The patient requested using a cane today. Patient ambulated x 110', one balance loss and recovered, gait remains slow but is much improved. patien tis going to hotel, Recommend a cane as eventually he will be in a shelter. He should improve once  Able to increase his ambulation .  Follow Up Recommendations  Home health PT(going to [facility])     Engineer, technical sales    Recommendations for Other Services       Precautions / Restrictions Precautions Precautions: Fall    Mobility  Bed Mobility Overal bed mobility: Independent                Transfers Overall transfer level: Needs assistance Equipment used: Straight cane Transfers: Sit to/from Stand Sit to Stand: Supervision         General transfer comment: no assist, no LOB  Ambulation/Gait Ambulation/Gait assistance: Min guard;Min assist Gait Distance (Feet): 110 Feet Assistive device: Straight cane Gait Pattern/deviations: Step-through pattern;Decreased step length - right;Decreased step length - left;Scissoring;Decreased dorsiflexion - right;Decreased dorsiflexion - left Gait velocity: decreased   General Gait Details: patient requesting a SPC, patient ambulated with 1 loss of balance, states that he feels th?RW is more cumbersome.   Stairs             Wheelchair Mobility    Modified Rankin (Stroke Patients Only)       Balance Overall balance  assessment: Independent         Standing balance support: No upper extremity supported;During functional activity Standing balance-Leahy Scale: Fair Standing balance comment: supervision for static standing and assist and/or UE support for dynamic activities, patient stood and adjusted cane without UE,                            Cognition Arousal/Alertness: Awake/alert Behavior During Therapy: WFL for tasks assessed/performed Overall Cognitive Status: No family/caregiver present to determine baseline cognitive functioning                                 General Comments: increased responses, not as sluggish      Exercises      General Comments        Pertinent Vitals/Pain Pain Assessment: No/denies pain    Home Living                      Prior Function            PT Goals (current goals can now be found in the care plan section) Progress towards PT goals: Progressing toward goals    Frequency    Min 3X/week      PT Plan Discharge plan needs to be updated    Co-evaluation              AM-PAC PT "6 Clicks" Mobility   Outcome Measure  Help needed turning from your back to your  side while in a flat bed without using bedrails?: None Help needed moving from lying on your back to sitting on the side of a flat bed without using bedrails?: None Help needed moving to and from a bed to a chair (including a wheelchair)?: A Little Help needed standing up from a chair using your arms (e.g., wheelchair or bedside chair)?: A Little Help needed to walk in hospital room?: A Little Help needed climbing 3-5 steps with a railing? : A Little 6 Click Score: 20    End of Session   Activity Tolerance: Patient tolerated treatment well Patient left: with call bell/phone within reach;in chair Nurse Communication: Mobility status PT Visit Diagnosis: Unsteadiness on feet (R26.81);Difficulty in walking, not elsewhere classified  (R26.2);Pain     Time: 9741-6384 PT Time Calculation (min) (ACUTE ONLY): 30 min  Charges:  $Gait Training: 23-37 mins                     [place]  Office [phone]    Claretha Cooper 04/19/2019, 5:45 PM

## 2019-04-20 LAB — GLUCOSE, CAPILLARY
Glucose-Capillary: 156 mg/dL — ABNORMAL HIGH (ref 70–99)
Glucose-Capillary: 127 mg/dL — ABNORMAL HIGH (ref 70–99)
Glucose-Capillary: 119 mg/dL — ABNORMAL HIGH (ref 70–99)
Glucose-Capillary: 242 mg/dL — ABNORMAL HIGH (ref 70–99)
Glucose-Capillary: 307 mg/dL — ABNORMAL HIGH (ref 70–99)

## 2019-04-20 MED ORDER — BLOOD GLUCOSE MONITOR KIT: PACK | 1 refills | Status: DC

## 2019-04-20 MED FILL — NovoLOG 100 UNIT/ML SOLN: 100 | 30 days supply | Qty: 10 | Fill #0

## 2019-04-20 MED FILL — NYSTATIN 100,000 UNITS/ML S: 100000 | 3 days supply | Qty: 60 | Fill #0

## 2019-04-20 MED FILL — HEARTBURN RELIEF 20 MG TAB: 20 | 12 days supply | Qty: 25 | Fill #0

## 2019-04-20 MED FILL — ULTICARE INS 0.5 ML 30GX1/2: 30G X 1/2" | 25 days supply | Qty: 100 | Fill #0

## 2019-04-20 MED FILL — LANTUS 100 UNITS/ML VIAL: 100 | 30 days supply | Qty: 10 | Fill #0

## 2019-04-20 MED FILL — traZODone HCL 50 MG TABS: 50 | 30 days supply | Qty: 30 | Fill #0

## 2019-04-20 NOTE — Progress Notes (Signed)
Occupational Therapy Treatment Patient Details Name: Michael [name] MRN: [id number] DOB: 1951/03/29 Today's Date: 04/20/2019    History of present illness 68 year old male with recent COVID-47 illness for which patient was admitted and discharged from the hospital about 2 weeks ago. Presenting with SOB, weakness, and fatigue. Repeat COVID-19 test was positive. Past medical history significant for pulmonary embolism, HTN, hyperlipidemia, HIV, gout, diabetes mellitus, depression, crack cocaine use, CAD, asthma and A-fib.    OT comments  Pt demonstrating increased activity tolerance and balance to perform grooming at sink and then functional mobility in hallway. Pt washing his face and brushing his teeth at sink with supervision and maintaining SpO2 between 100-98% on RA. Pt performing mobility in hallway with Min Guard A and SPC. Update dc recommendation to home with HHOT to optimize safety and independence with ADLs. Also recommend [facility] for medication management as pt continues to present with decreased executive functioning. Will continue to follow acutely as admitted.    Follow Up Recommendations  Home health OT;Supervision/Assistance - 24 hour(HH RN)    Equipment Recommendations  3 in 1 bedside commode    Recommendations for Other Services PT consult    Precautions / Restrictions Precautions Precautions: Fall Restrictions Weight Bearing Restrictions: No       Mobility Bed Mobility               General bed mobility comments: OOB in chair  Transfers Overall transfer level: Needs assistance Equipment used: Straight cane Transfers: Sit to/from Stand Sit to Stand: Supervision         General transfer comment: no assist, no LOB    Balance Overall balance assessment: Independent Sitting-balance support: Feet supported;No upper extremity supported Sitting balance-Leahy Scale: Fair Sitting balance - Comments: min guarding to lean forward to pull up sock    Standing balance support: No upper extremity supported;During functional activity Standing balance-Leahy Scale: Fair Standing balance comment: supervision for static standing and assist and/or UE support for dynamic activities, patient stood and adjusted cane without UE,                           ADL either performed or assessed with clinical judgement   ADL Overall ADL's : Needs assistance/impaired     Grooming: Supervision/safety;Set up;Oral care;Wash/dry face;Wash/dry hands;Standing Grooming Details (indicate cue type and reason): Supervision for safety. Pt demonstrating increased activity tolerance to perform grooming in standing without fatigue. SpO2 maintaining between 100-98% on RA throughout                 Toilet Transfer: Supervision/safety;Ambulation(simulated to recliner; cane)           Functional mobility during ADLs: Min guard;Cane General ADL Comments: Pt demonstrating increased activity tolerance. After grooming at sink, pt requesting to perform short mobility in hallway     Vision   Vision Assessment?: Vision impaired- to be further tested in functional context   Perception     Praxis      Cognition Arousal/Alertness: Awake/alert Behavior During Therapy: WFL for tasks assessed/performed Overall Cognitive Status: No family/caregiver present to determine baseline cognitive functioning Area of Impairment: Problem solving;Attention;Safety/judgement;Awareness;Memory                   Current Attention Level: Selective Memory: Decreased short-term memory   Safety/Judgement: Decreased awareness of safety Awareness: Emergent Problem Solving: Slow processing General Comments: Following commands and motivated to participate in therapy. Recalls performing Pill Box Test with OT yesterday  Exercises Exercises: General Lower Extremity;Other exercises   Shoulder Instructions       General Comments SpO2 100-98% on RA.      Pertinent Vitals/ Pain       Pain Assessment: No/denies pain  Home Living                                          Prior Functioning/Environment              Frequency  Min 3X/week        Progress Toward Goals  OT Goals(current goals can now be found in the care plan section)  Progress towards OT goals: Progressing toward goals  Acute Rehab OT Goals Patient Stated Goal: not stated OT Goal Formulation: With patient Time For Goal Achievement: 04/27/19 Potential to Achieve Goals: Good ADL Goals Pt Will Perform Grooming: with modified independence;standing Pt Will Perform Lower Body Dressing: with modified independence;sit to/from stand Pt Will Transfer to Toilet: with modified independence;ambulating;regular height toilet Pt Will Perform Toileting - Clothing Manipulation and hygiene: with modified independence;sitting/lateral leans;sit to/from stand Additional ADL Goal #1: Pt will demonstrate increased activity tolerance to perform three ADLs in standing with supervision  Plan Discharge plan needs to be updated;Frequency remains appropriate    Co-evaluation                 AM-PAC OT "6 Clicks" Daily Activity     Outcome Measure   Help from another person eating meals?: None Help from another person taking care of personal grooming?: None Help from another person toileting, which includes using toliet, bedpan, or urinal?: A Little Help from another person bathing (including washing, rinsing, drying)?: A Little Help from another person to put on and taking off regular upper body clothing?: A Little Help from another person to put on and taking off regular lower body clothing?: A Little 6 Click Score: 20    End of Session Equipment Utilized During Treatment: (cane)  OT Visit Diagnosis: Unsteadiness on feet (R26.81);Other abnormalities of gait and mobility (R26.89);Muscle weakness (generalized) (M62.81)   Activity Tolerance Patient  tolerated treatment well   Patient Left in chair;with call bell/phone within reach;with chair alarm set   Nurse Communication Mobility status        Time: 6837-2902 OT Time Calculation (min): 29 min  Charges: OT General Charges $OT Visit: 1 Visit OT Treatments $Self Care/Home Management : 8-22 mins $Therapeutic Activity: 8-22 mins  Yanelly Cantrelle MSOT, OTR/L Acute Rehab Pager: [phone] Office: [place] 04/20/2019, 4:25 PM

## 2019-04-20 NOTE — Progress Notes (Signed)
No family update per patient request.

## 2019-04-20 NOTE — Care Management (Addendum)
We are waiting for Health department to assign patient to a hotel for [place]. [facility] will deliver diabetic testing equipment to patient's hotel once we confirm where he will go.       Ricki Miller, RN BSN Case Manager [phone]

## 2019-04-20 NOTE — Progress Notes (Signed)
[facility]                                                                                                                                                                         Patient Demographics  Michael [name], is a 68 y.o. male  YBO:[id number]  IDP:[id number]  DOB - March 10, 1951  Admit date - 04/11/2019  Admitting Physician Bonnell Public, MD  Outpatient Primary MD for the patient is Tawny Asal  LOS - 9   Chief Complaint  Patient presents with  . Shortness of Breath        Assessment & Plan    Patient seen briefly today due for discharge soon per Discharge done yesterday by me no further issues, Vital signs stable, patient feels fine.      Medications  Scheduled Meds: . aspirin  81 mg Oral Daily  . emtricitabine-rilpivir-tenofovir AF  1 tablet Oral Q breakfast  . famotidine  20 mg Oral BID  . fluticasone  1 puff Inhalation BID  . heparin  5,000 Units Subcutaneous Q8H  . insulin aspart  0-15 Units Subcutaneous TID WC  . insulin aspart  0-5 Units Subcutaneous QHS  . insulin glargine  20 Units Subcutaneous BID  . nystatin  5 mL Oral QID  . ondansetron  4 mg Intravenous Once   Continuous Infusions: . chlorproMAZINE (THORAZINE) IV Stopped (04/14/19 1634)   PRN Meds:.alum & mag hydroxide-simeth, chlorproMAZINE (THORAZINE) IV, guaiFENesin-dextromethorphan, Ipratropium-Albuterol, polyethylene glycol    Time Spent in minutes   10 minutes   Lala Lund M.D on 04/20/2019 at 9:46 AM  Between 7am to 7pm - Pager - [phone]  After 7pm go to [email or web address] - password [email or web address]  And look for the night coverage person covering for me after hours  [facility] Office  [phone]    Subjective:   Michael [name] today has, No headache, No chest pain, No abdominal pain - No Nausea, No new weakness tingling or numbness, No Cough - SOB.   Objective:    Vitals:   04/19/19 1734 04/19/19 2048 04/20/19 0354 04/20/19 0733  BP: 120/80 115/70 120/74 113/65  Pulse: 94   85  Resp: 18 18 18 19   Temp: 98 F (36.7 C) 98.6 F (37 C) 98.4 F (36.9 C) 98 F (36.7 C)  TempSrc: Oral Oral Oral Tympanic  SpO2: 100%   99%  Weight:      Height:        Wt Readings from Last 3 Encounters:  04/11/19 74.4 kg  03/19/19 62.6 kg  12/16/18 74.4 kg     Intake/Output Summary (Last 24 hours) at 04/20/2019 0946 Last data filed at 04/20/2019 0600 Gross per 24 hour  Intake 960 ml  Output 1000 ml  Net -40 ml    Exam Awake Alert, Oriented X 3, No new F.N deficits, Normal affect [place].AT,PERRAL Supple Neck,No JVD, No cervical lymphadenopathy appriciated.  Symmetrical Chest wall movement, Good air movement bilaterally, CTAB RRR,No Gallops,Rubs or new Murmurs, No Parasternal Heave +ve B.Sounds, Abd Soft, Non tender, No organomegaly appriciated, No rebound - guarding or rigidity. No Cyanosis, Clubbing or edema, No new Rash or bruise     Data Review

## 2019-04-20 NOTE — Discharge Instructions (Signed)
Follow with Primary MD [name], PA-C in 7 days   Get CBC, CMP, 2 view Chest X ray -  checked next visit within 1 week by Primary MD   Activity: As tolerated with Full fall precautions use walker/cane & assistance as needed  Disposition Home    Diet: Low carbohydrate diet.  Accuchecks 4 times/day, Once in AM empty stomach and then before each meal. Log in all results and show them to your Prim.MD in 3 days. If any glucose reading is under 80 or above 300 call your Prim MD immidiately. Follow Low glucose instructions for glucose under 80 as instructed.    Special Instructions: If you have smoked or chewed Tobacco  in the last 2 yrs please stop smoking, stop any regular Alcohol  and or any Recreational drug use.  On your next visit with your primary care physician please Get Medicines reviewed and adjusted.  Please request your Prim.MD to go over all Hospital Tests and Procedure/Radiological results at the follow up, please get all Hospital records sent to your Prim MD by signing hospital release before you go home.  If you experience worsening of your admission symptoms, develop shortness of breath, life threatening emergency, suicidal or homicidal thoughts you must seek medical attention immediately by calling 911 or calling your MD immediately  if symptoms less severe.  You Must read complete instructions/literature along with all the possible adverse reactions/side effects for all the Medicines you take and that have been prescribed to you. Take any new Medicines after you have completely understood and accpet all the possible adverse reactions/side effects.       Person Under Monitoring Name: Michael [name]  Location: [address] [place]  [address]   Infection Prevention Recommendations for Individuals Confirmed to have, or Being Evaluated for, 2019 Novel Coronavirus (COVID-19) Infection Who Receive Care at Home  Individuals who are confirmed to have,  or are being evaluated for, COVID-19 should follow the prevention steps below until a healthcare provider or local or state health department says they can return to normal activities.  Stay home except to get medical care You should restrict activities outside your home, except for getting medical care. Do not go to work, school, or public areas, and do not use public transportation or taxis.  Call ahead before visiting your doctor Before your medical appointment, call the healthcare provider and tell them that you have, or are being evaluated for, COVID-19 infection. This will help the healthcare providers office take steps to keep other people from getting infected. Ask your healthcare provider to call the local or state health department.  Monitor your symptoms Seek prompt medical attention if your illness is worsening (e.g., difficulty breathing). Before going to your medical appointment, call the healthcare provider and tell them that you have, or are being evaluated for, COVID-19 infection. Ask your healthcare provider to call the local or state health department.  Wear a facemask You should wear a facemask that covers your nose and mouth when you are in the same room with other people and when you visit a healthcare provider. People who live with or visit you should also wear a facemask while they are in the same room with you.  Separate yourself from other people in your home As much as possible, you should stay in a different room from other people in your home. Also, you should use a separate bathroom, if available.  Avoid sharing household items You should not share  dishes, drinking glasses, cups, eating utensils, towels, bedding, or other items with other people in your home. After using these items, you should wash them thoroughly with soap and water.  Cover your coughs and sneezes Cover your mouth and nose with a tissue when you cough or sneeze, or you can cough or  sneeze into your sleeve. Throw used tissues in a lined trash can, and immediately wash your hands with soap and water for at least 20 seconds or use an alcohol-based hand rub.  Wash your [facility] your hands often and thoroughly with soap and water for at least 20 seconds. You can use an alcohol-based hand sanitizer if soap and water are not available and if your hands are not visibly dirty. Avoid touching your eyes, nose, and mouth with unwashed hands.   Prevention Steps for Caregivers and Household Members of Individuals Confirmed to have, or Being Evaluated for, COVID-19 Infection Being Cared for in the Home  If you live with, or provide care at home for, a person confirmed to have, or being evaluated for, COVID-19 infection please follow these guidelines to prevent infection:  Follow healthcare providers instructions Make sure that you understand and can help the patient follow any healthcare provider instructions for all care.  Provide for the patients basic needs You should help the patient with basic needs in the home and provide support for getting groceries, prescriptions, and other personal needs.  Monitor the patients symptoms If they are getting sicker, call his or her medical provider and tell them that the patient has, or is being evaluated for, COVID-19 infection. This will help the healthcare providers office take steps to keep other people from getting infected. Ask the healthcare provider to call the local or state health department.  Limit the number of people who have contact with the patient  If possible, have only one caregiver for the patient.  Other household members should stay in another home or place of residence. If this is not possible, they should stay  in another room, or be separated from the patient as much as possible. Use a separate bathroom, if available.  Restrict visitors who do not have an essential need to be in the home.  Keep older  adults, very young children, and other sick people away from the patient Keep older adults, very young children, and those who have compromised immune systems or chronic health conditions away from the patient. This includes people with chronic heart, lung, or kidney conditions, diabetes, and cancer.  Ensure good ventilation Make sure that shared spaces in the home have good air flow, such as from an air conditioner or an opened window, weather permitting.  Wash your hands often  Wash your hands often and thoroughly with soap and water for at least 20 seconds. You can use an alcohol based hand sanitizer if soap and water are not available and if your hands are not visibly dirty.  Avoid touching your eyes, nose, and mouth with unwashed hands.  Use disposable paper towels to dry your hands. If not available, use dedicated cloth towels and replace them when they become wet.  Wear a facemask and gloves  Wear a disposable facemask at all times in the room and gloves when you touch or have contact with the patients blood, body fluids, and/or secretions or excretions, such as sweat, saliva, sputum, nasal mucus, vomit, urine, or feces.  Ensure the mask fits over your nose and mouth tightly, and do not touch it during  use.  Throw out disposable facemasks and gloves after using them. Do not reuse.  Wash your hands immediately after removing your facemask and gloves.  If your personal clothing becomes contaminated, carefully remove clothing and launder. Wash your hands after handling contaminated clothing.  Place all used disposable facemasks, gloves, and other waste in a lined container before disposing them with other household waste.  Remove gloves and wash your hands immediately after handling these items.  Do not share dishes, glasses, or other household items with the patient  Avoid sharing household items. You should not share dishes, drinking glasses, cups, eating utensils, towels,  bedding, or other items with a patient who is confirmed to have, or being evaluated for, COVID-19 infection.  After the person uses these items, you should wash them thoroughly with soap and water.  Wash laundry thoroughly  Immediately remove and wash clothes or bedding that have blood, body fluids, and/or secretions or excretions, such as sweat, saliva, sputum, nasal mucus, vomit, urine, or feces, on them.  Wear gloves when handling laundry from the patient.  Read and follow directions on labels of laundry or clothing items and detergent. In general, wash and dry with the warmest temperatures recommended on the label.  Clean all areas the individual has used often  Clean all touchable surfaces, such as counters, tabletops, doorknobs, bathroom fixtures, toilets, phones, keyboards, tablets, and bedside tables, every day. Also, clean any surfaces that may have blood, body fluids, and/or secretions or excretions on them.  Wear gloves when cleaning surfaces the patient has come in contact with.  Use a diluted bleach solution (e.g., dilute bleach with 1 part bleach and 10 parts water) or a household disinfectant with a label that says EPA-registered for coronaviruses. To make a bleach solution at home, add 1 tablespoon of bleach to 1 quart (4 cups) of water. For a larger supply, add  cup of bleach to 1 gallon (16 cups) of water.  Read labels of cleaning products and follow recommendations provided on product labels. Labels contain instructions for safe and effective use of the cleaning product including precautions you should take when applying the product, such as wearing gloves or eye protection and making sure you have good ventilation during use of the product.  Remove gloves and wash hands immediately after cleaning.  Monitor yourself for signs and symptoms of illness Caregivers and household members are considered close contacts, should monitor their health, and will be asked to  limit movement outside of the home to the extent possible. Follow the monitoring steps for close contacts listed on the symptom monitoring form.   ? If you have additional questions, contact your local health department or call the epidemiologist on call at [phone] (available 24/7). ? This guidance is subject to change. For the most up-to-date guidance from [facility], please refer to their website: [email or web address]

## 2019-04-20 NOTE — TOC Transition Note (Addendum)
Transition of Care [facility]) - CM/SW Discharge Note   Patient Details  Name: Michael [name] MRN: [id number] Date of Birth: 06/14/1951  Transition of Care [facility]) CM/SW Contact:  Ninfa Meeker, RN Phone Number: [phone] (working remotely) 04/20/2019, 12:47 PM   Clinical Narrative: 68 yr old gentleman admitted and treated for COVID 19. Thankfully, patient is improving and able to discharge. He will be sent a a [employer] for 14 days and then will go to the [employer]. Case manager contacted [employer] to request a cane. Referral for Home Health was called to [facility]) and was denied. Referral was called to Joen Laura, [facility] waiting for response. 14:08 received call that [facility] can not accept patient. Case manager called referral to Tonny Branch, [facility] Liaison and they accepted patient for Home Health. Patient's meds are being couriered to [facility] from [facility]. Case manager contacted Nathaniel Man, [place] [facility] and requested that $5.00 co-pay for meds be covered from department petty cash. Zack delivered funds to [facility]. Prescription for glucometer, test strips and Lancets were faxed to [facility] [phone], they will be able to deliver to patient's hotel.  Final next level of care: Other (comment)(Hotel Voucher) Barriers to Discharge: Homeless with medical needs, Other (comment)(Needng hotel voucher)   Patient Goals and CMS Choice to get better      Discharge Placement : Hotel             :          Discharge Plan and Services                                     Social Determinants of Health (SDOH) Interventions     Readmission Risk Interventions No flowsheet data found.

## 2019-04-20 NOTE — Consult Note (Addendum)
   [facility] Inpatient Consult   04/20/2019  Michael [name] Sep 26, 1951 [id number]    Update Note:  Care coordination call made and spoke with transition of care CM to touch base regarding patient's issues post discharge. Was informed that plan is for patient to transition to a county hotel for 14 day quarantine instead of skilled nursing facility, then will go to the shelter Pensions consultant in [employer]). Notified transition of care CM of patient's need to establish care with a primary care provider. Requested TOC CM to provide information of county hotel for patient contact.  Able to speak to patient over the hospital room phone (HIPAA verified).  In talking with patient he reports about needing help in managing his medications ("not good in staying on schedule with medications which might be causing blood sugar to go up and down"). He mentioned "catching the city bus for transportation to get to [place] appointments or his brother Michael [name]) if available, provides transport". Explained to patient the importance of going and attending his doctor's appointments and establishing care with a primary care provider after discharge.  Will follow up with community regarding ongoing medication management needs. Patient states he would appreciate the help with his medications to help control DM.   Will continue to follow for progress and disposition/ needs.  Of note, [facility] Care Management services does not replace or interfere with any services that are needed or arranged by inpatient case management or social work.    For additional questions or referrals please contact:  Edwena Felty A. Kathey Simer, BSN, RN-BC [facility] Liaison Cell: [phone]

## 2019-04-21 LAB — GLUCOSE, CAPILLARY
Glucose-Capillary: 373 mg/dL — ABNORMAL HIGH (ref 70–99)
Glucose-Capillary: 121 mg/dL — ABNORMAL HIGH (ref 70–99)
Glucose-Capillary: 160 mg/dL — ABNORMAL HIGH (ref 70–99)
Glucose-Capillary: 190 mg/dL — ABNORMAL HIGH (ref 70–99)

## 2019-04-21 MED ORDER — SENNOSIDES-DOCUSATE SODIUM 8.6-50 MG PO TABS
1.0000 | ORAL_TABLET | Freq: Two times a day (BID) | ORAL | Status: DC
  Administered 2019-04-21: 1 via ORAL
  Filled 2019-04-21: qty 1

## 2019-04-21 MED ORDER — POLYETHYLENE GLYCOL 3350 17 G PO PACK: 17.0000 g | PACK | Freq: Every day | ORAL | Status: DC

## 2019-04-21 MED ORDER — BENZONATATE 100 MG PO CAPS: 100.0000 mg | ORAL_CAPSULE | Freq: Three times a day (TID) | ORAL | Status: DC

## 2019-04-21 MED ORDER — POLYETHYLENE GLYCOL 3350 17 G PO PACK: 17.0000 g | PACK | Freq: Two times a day (BID) | ORAL | Status: DC

## 2019-04-21 NOTE — Progress Notes (Signed)
[place] TEAM 1 - Stepdown/ICU TEAM  Michael [name]  BWI:[id number] DOB: [birth year] DOA: 04/11/2019 PCP: [name], PA-C    Brief Narrative:  68 year old with a history of recent COVID illness, PE, HTN, HLD, HIV, gout, DM, crack cocaine abuse, CAD, atrial fibrillation, depression, and asthma who presented with shortness of breath, weakness, and fatigue x3 days.  Subjective: The patient was seen for follow-up visit.  He was sitting up in bedside chair.  He was breathing comfortably on room air with sats 98-100%.  He denied chest pain nausea vomiting or abdominal pain.  He did report some constipation for which I prescribed Senokot and MiraLAX to be used today.   He is simply awaiting placement and remains clear for discharge as soon as a bed can be arranged.  A full discharge summary was completed by Dr. Candiss Norse on 04/20/2019.  Assessment & Plan:  COVID pneumonia  Uncontrolled DM2  HIV  Acute kidney injury on CKD stage III  Generalized weakness and deconditioning  Esophagitis with odynophagia  Ongoing cocaine abuse  The patient remains clinically stable for discharge.  At such time that he has a safe disposition arranged he can be medically discharged.  DVT prophylaxis: Subcutaneous heparin Code Status: FULL CODE Family Communication:  Disposition Plan:   Consultants:  none  Objective: Blood pressure 123/72, pulse 83, temperature 98.5 F (36.9 C), temperature source Oral, resp. rate 20, height 5\' 7"  (1.702 m), weight 74.4 kg, SpO2 98 %.  Intake/Output Summary (Last 24 hours) at 04/21/2019 0930 Last data filed at 04/21/2019 0303 Gross per 24 hour  Intake 240 ml  Output 900 ml  Net -660 ml   Filed Weights   04/11/19 1446  Weight: 74.4 kg    Examination: General: No acute respiratory distress Lungs: Clear to auscultation bilaterally without wheezes or crackles Cardiovascular: Regular rate and rhythm without murmur gallop or rub normal S1 and  S2 Abdomen: Nontender, nondistended, soft, bowel sounds positive, no rebound, no ascites, no appreciable mass Extremities: No significant cyanosis, clubbing, or edema bilateral lower extremities  CBC: Recent Labs  Lab 04/17/19 0515 04/18/19 0330 04/19/19 0310  WBC 4.2 3.5* 4.7  NEUTROABS 2.3 2.0 2.7  HGB 9.1* 8.7* 9.1*  HCT 29.4* 28.0* 29.7*  MCV 82.1 82.6 83.7  PLT 183 169 638   Basic Metabolic Panel: Recent Labs  Lab 04/17/19 0515 04/18/19 0330 04/19/19 0310  NA 139 138 140  K 4.0 4.2 4.5  CL 106 108 109  CO2 25 22 23   GLUCOSE 144* 248* 194*  BUN 28* 26* 27*  CREATININE 1.42* 1.41* 1.47*  CALCIUM 8.4* 8.3* 8.7*  MG 1.9 1.9 1.9   GFR: Estimated Creatinine Clearance: 44.3 mL/min (A) (by C-G formula based on SCr of 1.47 mg/dL (H)).  Liver Function Tests: Recent Labs  Lab 04/16/19 0445 04/17/19 0515 04/18/19 0330 04/19/19 0310  AST 14* 14* 15 15  ALT 11 12 13 12   ALKPHOS 52 48 44 49  BILITOT 0.1* 0.2* <0.1* 0.1*  PROT 6.1* 6.1* 6.2* 6.5  ALBUMIN 2.8* 2.9* 2.8* 3.0*    HbA1C: Hgb A1c MFr Bld  Date/Time Value Ref Range Status  04/15/2019 11:45 AM 13.2 (H) 4.8 - 5.6 % Final    Comment:    (NOTE)         Prediabetes: 5.7 - 6.4         Diabetes: >6.4         Glycemic control for adults with diabetes: <7.0   03/19/2019  12:20 AM 12.6 (H) 4.8 - 5.6 % Final    Comment:    (NOTE) Pre diabetes:          5.7%-6.4% Diabetes:              >6.4% Glycemic control for   <7.0% adults with diabetes     CBG: Recent Labs  Lab 04/20/19 0734 04/20/19 1136 04/20/19 1622 04/20/19 2300 04/21/19 0728  GLUCAP 127* 119* 242* 307* 121*    Recent Results (from the past 240 hour(s))  SARS Coronavirus 2 (CEPHEID- Performed in [facility] hospital lab), Hosp Order     Status: Abnormal   Collection Time: 04/11/19  3:40 PM   Specimen: Nasopharyngeal Swab  Result Value Ref Range Status   SARS Coronavirus 2 POSITIVE (A) NEGATIVE Final    Comment: RESULT CALLED TO, READ  BACK BY AND VERIFIED WITH: SIMPSON AT 1756ON 04/11/2019 BY JPM (NOTE) If result is NEGATIVE SARS-CoV-2 target nucleic acids are NOT DETECTED. The SARS-CoV-2 RNA is generally detectable in upper and lower  respiratory specimens during the acute phase of infection. The lowest  concentration of SARS-CoV-2 viral copies this assay can detect is 250  copies / mL. A negative result does not preclude SARS-CoV-2 infection  and should not be used as the sole basis for treatment or other  patient management decisions.  A negative result may occur with  improper specimen collection / handling, submission of specimen other  than nasopharyngeal swab, presence of viral mutation(s) within the  areas targeted by this assay, and inadequate number of viral copies  (<250 copies / mL). A negative result must be combined with clinical  observations, patient history, and epidemiological information. If result is POSITIVE SARS-CoV-2 target nucleic acids are DETECTED. The  SARS-CoV-2 RNA is generally detectable in upper and lower  respiratory specimens during the acute phase of infection.  Positive  results are indicative of active infection with SARS-CoV-2.  Clinical  correlation with patient history and other diagnostic information is  necessary to determine patient infection status.  Positive results do  not rule out bacterial infection or co-infection with other viruses. If result is PRESUMPTIVE POSTIVE SARS-CoV-2 nucleic acids MAY BE PRESENT.   A presumptive positive result was obtained on the submitted specimen  and confirmed on repeat testing.  While 2019 novel coronavirus  (SARS-CoV-2) nucleic acids may be present in the submitted sample  additional confirmatory testing may be necessary for epidemiological  and / or clinical management purposes  to differentiate between  SARS-CoV-2 and other Sarbecovirus currently known to infect humans.  If clinically indicated additional testing with an alternate  test  methodology [phone]) is a dvised. The SARS-CoV-2 RNA is generally  detectable in upper and lower respiratory specimens during the acute  phase of infection. The expected result is Negative. Fact Sheet for Patients:  StrictlyIdeas.no Fact Sheet for Healthcare Providers: BankingDealers.co.za This test is not yet approved or cleared by the [place] FDA and has been authorized for detection and/or diagnosis of SARS-CoV-2 by FDA under an Emergency Use Authorization (EUA).  This EUA will remain in effect (meaning this test can be used) for the duration of the COVID-19 declaration under Section 564(b)(1) of the Act, 21 U.S.C. section 360bbb-3(b)(1), unless the authorization is terminated or revoked sooner. Performed at [facility], [place] [address] [place], [place] [address]   Gastrointestinal Panel by PCR , Stool     Status: None   Collection Time: 04/12/19  3:36 AM  Specimen: Stool  Result Value Ref Range Status   Campylobacter species NOT DETECTED NOT DETECTED Final   Plesimonas shigelloides NOT DETECTED NOT DETECTED Final   Salmonella species NOT DETECTED NOT DETECTED Final   Yersinia enterocolitica NOT DETECTED NOT DETECTED Final   Vibrio species NOT DETECTED NOT DETECTED Final   Vibrio cholerae NOT DETECTED NOT DETECTED Final   Enteroaggregative E coli (EAEC) NOT DETECTED NOT DETECTED Final   Enteropathogenic E coli (EPEC) NOT DETECTED NOT DETECTED Final   Enterotoxigenic E coli (ETEC) NOT DETECTED NOT DETECTED Final   Shiga like toxin producing E coli (STEC) NOT DETECTED NOT DETECTED Final   Shigella/Enteroinvasive E coli (EIEC) NOT DETECTED NOT DETECTED Final   Cryptosporidium NOT DETECTED NOT DETECTED Final   Cyclospora cayetanensis NOT DETECTED NOT DETECTED Final   Entamoeba histolytica NOT DETECTED NOT DETECTED Final   Giardia lamblia NOT DETECTED NOT DETECTED Final   Adenovirus F40/41 NOT DETECTED  NOT DETECTED Final   Astrovirus NOT DETECTED NOT DETECTED Final   Norovirus GI/GII NOT DETECTED NOT DETECTED Final   Rotavirus A NOT DETECTED NOT DETECTED Final   Sapovirus (I, II, IV, and V) NOT DETECTED NOT DETECTED Final    Comment: Performed at [facility], [address] [place], [place] [address]  C difficile quick scan w PCR reflex     Status: None   Collection Time: 04/12/19  3:36 AM   Specimen: Stool  Result Value Ref Range Status   C Diff antigen NEGATIVE NEGATIVE Final   C Diff toxin NEGATIVE NEGATIVE Final   C Diff interpretation No C. difficile detected.  Final    Comment: Performed at [facility], [place] [address], [place], [place] [address]     Scheduled Meds: . aspirin  81 mg Oral Daily  . emtricitabine-rilpivir-tenofovir AF  1 tablet Oral Q breakfast  . famotidine  20 mg Oral BID  . fluticasone  1 puff Inhalation BID  . heparin  5,000 Units Subcutaneous Q8H  . insulin aspart  0-15 Units Subcutaneous TID WC  . insulin aspart  0-5 Units Subcutaneous QHS  . insulin glargine  20 Units Subcutaneous BID  . ondansetron  4 mg Intravenous Once     LOS: 10 days   Cherene Altes, MD [facility] Office  [phone] Pager - Text Page per Amion  If 7PM-7AM, please contact night-coverage per Amion 04/21/2019, 9:30 AM

## 2019-04-21 NOTE — Progress Notes (Addendum)
Update: Health dept will be picking patient up today within the next 45 min- 1 hour, patient will go to room 244 at the [place]. [id number]. Nurse informed. RNCM Manuela Schwartz informed to f/u with pharmacy for med delivery.   Isolation Order signed by nurse and explained verbally to patient, was faxed to [facility] . She is working on patient's room number and seeing when transportation can be set up, then calling me back.   [place], [place]

## 2019-04-21 NOTE — Consult Note (Signed)
   [facility] CM Inpatient Consult   04/21/2019  Adante Courington [birth year] [id number]    Update note:  Per transition of care SW note, patient will be picked up by Health dept. today and will go to room 244 at the [place]. [id number]; with RN aware and RNCM Manuela Schwartz) informed to follow-up with pharmacy for medication delivery.   Call made to [facility]) and staff confirmed that patient can be contacted and followed-up thru his room phone by calling [phone] and ask to be connected to patient's room (244). Also patient gave permission to call his sister Chrys Racer.) as listed in Epic, if needed, stating that he frequently talks to his sister.  Patient had verbally agreed for [facility] services. Referral made to [place] for assistance with medication management and for [facility] community care management coordinator for complex care/ disease management of DM (recent A1c is 13.2) and to assess further needs after discharge.  Inpatient transition of care member outreached to inform that [facility] care management is following.   For questions and additional information, please call:  Abraham Margulies A. Damaria Stofko, BSN, RN-BC [facility] Liaison Cell: [phone]

## 2019-04-22 ENCOUNTER — Encounter: Payer: Self-pay | Admitting: *Deleted

## 2019-04-22 ENCOUNTER — Other Ambulatory Visit: Payer: Self-pay | Admitting: *Deleted

## 2019-04-22 ENCOUNTER — Other Ambulatory Visit: Payer: Self-pay | Admitting: Pharmacist

## 2019-04-22 NOTE — Patient Outreach (Signed)
[facility]) Care Management  Adrian   04/22/2019  Niquan Charnley [birth year] [id number]  Reason for referral: Medication Management  Referral source: [place] Utilization Management Department Current insurance: [facility]  Outreach:  Unsuccessful telephone call attempt #1 to patient. -called Barbaraann Share per referral notes, unable to connect with patient room as line is busy -called sister, [name], left HIPAA compliant voicemail  Plan:  -I will make another outreach attempt to patient within 3-4 business days.     Ralene Bathe, PharmD, [place] [phone]

## 2019-04-22 NOTE — Patient Outreach (Signed)
[facility]) Care Management  04/22/2019  Michael [name] 04/05/1951 [id number]   Referral received from hospital liaison as member recently admitted to hospital and diagnosed as Covid positive.  Has history of homelessness, discharged to [facility] room 244 with hotel voucher.  Per chart, plan to go to shelter at [facility] in [place] once he leave the hotel.  Per chart, he has history of uncontrolled diabetes (A1C - 13.2), HIV, HTN, CAD, CABG, Cocaine abuse, CKD, HLD, fibromyalgia, and major depressive disorder.  Call placed to hotel and requested to be connected to room.  Member able to verify identity, state his mailing address has changed to [address] [place], [place] [address], which is where his sister Michael [name] lives.  This care manager introduced self and stated purpose of call.  [facility] care management services explained, he agrees to involvement with program.  Report that he continues to have some intermittent chest discomfort and cough.  Also state he has been experiencing some indigestion and hiccups.  Independent in care, unsure of the date that he will have to leave the hotel.  This care manager inquired about the possibility of residing with family (has brother that help sometimes with transportation and sisters that are listed as point of contacts), he denies that as an option.  He would however like to have his sister Michael [name] listed as his POA.    State he is receiving meals at the hotel.  Received new glucose meter via delivery as well, has not started checking blood sugars.  Advised of importance of daily monitoring especially with daily insulin doses.  Attempted to review medications, state he does not have several of them but does have new prescriptions that he will have filled and delivered through [facility].  Unable to review medications that he currently has due to trouble reading labels.  Wears reading glasses but does not have any  with him at this time.  State he has vials of Lantus but unable to draw insulin up due to arthritis.  He has not tried pill packs to increase compliance, but willing to try.  Already assigned to [facility] pharmacist.    PHQ9 score - 26, Zoloft on medication list.  Per chart, he has been seen by behavioral health in the past.  Denies any thoughts of hurting self at this current time, but referral was placed to social worker for community resources.    Has follow up visit scheduled with [facility] on 8/3.  Denies any urgent concerns at this time.  Will follow up within the next week.  Depression screen [facility] 2/9 04/22/2019 10/22/2017 12/26/2016 04/17/2016 01/28/2016  Decreased Interest 3 0 2 0 0  Down, Depressed, Hopeless 3 - 3 1 1   PHQ - 2 Score 6 0 5 1 1   Altered sleeping 3 - 1 - -  Tired, decreased energy 3 - 2 - -  Change in appetite 2 - 2 - -  Feeling bad or failure about yourself  3 - 3 - -  Trouble concentrating 3 - 3 - -  Moving slowly or fidgety/restless 3 - 2 - -  Suicidal thoughts 3 - 1 - -  PHQ-9 Score 26 - 19 - -  Difficult doing work/chores Extremely dIfficult - Somewhat difficult - -  Some recent data might be hidden   Fall Risk  04/22/2019 10/22/2017 04/17/2016 01/28/2016 01/25/2016  Falls in the past year? 1 No No No No  Number  falls in past yr: 0 - - - -  Injury with Fall? 0 - - - -  Risk Factor Category  - - - - -  Risk for fall due to : - - Medication side effect - -  Risk for fall due to: Comment - - - - -  Follow up - - - - -   [facility] CM Care Plan Problem One     Most Recent Value  Care Plan Problem One  Risk for hospital readmission related to inability to manage chronic medical conditions as evidenced by homelessness and recent hospitalization  Role Documenting the Problem One  Care Management [place] for Problem One  Active  [facility] Long Term Goal   Member will not be readmitted to hospital within the next 31 days  [facility] Long Term Goal Start Date   04/22/19  Interventions for Problem One Long Term Goal  Discharge instructions reviewed with member.  Advised of importance of following plan of care (home health involvement, daily blood sugar monitoring, etc) in effort to decrease risk of readmission.  [facility] CM Short Term Goal #1   Member will be able to verbalize plan for support after checking out of hotel within the next 2 weeks  [facility] CM Short Term Goal #1 Start Date  04/22/19  Interventions for Short Term Goal #1  Referral placed to social worker for community resources as well as help with Advanced Life planning  THN CM Short Term Goal #2   Member will report taking all medications as indicated over the next 4 weeks  [facility]N CM Short Term Goal #2 Start Date  04/22/19  Interventions for Short Term Goal #2  Attempted to review medications.  Collaboration with [facility] pharmacist in effort to provide support for medications  THN CM Short Term Goal #3  Member will complete scheduled follow up appointment with PCP within the next 2 weeks  THN CM Short Term Goal #3 Start Date  04/22/19  Interventions for Short Tern Goal #3  Upcoming televisit with primary MD reviewed with member.  Discussed importance of notifying office of whereabouts in the event he is no longer at hotel.     Michael [name], [place], MSN [place] [phone]

## 2019-04-23 ENCOUNTER — Other Ambulatory Visit: Payer: Self-pay

## 2019-04-23 ENCOUNTER — Other Ambulatory Visit: Payer: Self-pay | Admitting: Pharmacist

## 2019-04-23 ENCOUNTER — Emergency Department (HOSPITAL_COMMUNITY)
Admission: EM | Admit: 2019-04-23 | Discharge: 2019-04-23 | Disposition: A | Discharge status: Home / Self Care | Payer: Medicare Other | Attending: Emergency Medicine | Admitting: Emergency Medicine

## 2019-04-23 ENCOUNTER — Emergency Department (HOSPITAL_COMMUNITY): Payer: Medicare Other

## 2019-04-23 DIAGNOSIS — K5900 Constipation, unspecified: Secondary | ICD-10-CM | POA: Diagnosis present

## 2019-04-23 DIAGNOSIS — U071 COVID-19: Secondary | ICD-10-CM

## 2019-04-23 DIAGNOSIS — E1122 Type 2 diabetes mellitus with diabetic chronic kidney disease: Secondary | ICD-10-CM | POA: Diagnosis not present

## 2019-04-23 DIAGNOSIS — Z794 Long term (current) use of insulin: Secondary | ICD-10-CM | POA: Diagnosis not present

## 2019-04-23 DIAGNOSIS — K5909 Other constipation: Secondary | ICD-10-CM

## 2019-04-23 DIAGNOSIS — I130 Hypertensive heart and chronic kidney disease with heart failure and stage 1 through stage 4 chronic kidney disease, or unspecified chronic kidney disease: Secondary | ICD-10-CM | POA: Insufficient documentation

## 2019-04-23 DIAGNOSIS — I509 Heart failure, unspecified: Secondary | ICD-10-CM | POA: Insufficient documentation

## 2019-04-23 DIAGNOSIS — Z79899 Other long term (current) drug therapy: Secondary | ICD-10-CM | POA: Diagnosis not present

## 2019-04-23 DIAGNOSIS — I2510 Atherosclerotic heart disease of native coronary artery without angina pectoris: Secondary | ICD-10-CM | POA: Diagnosis not present

## 2019-04-23 DIAGNOSIS — K5641 Fecal impaction: Secondary | ICD-10-CM | POA: Diagnosis not present

## 2019-04-23 DIAGNOSIS — N183 Chronic kidney disease, stage 3 (moderate): Secondary | ICD-10-CM | POA: Insufficient documentation

## 2019-04-23 LAB — CBC WITH DIFFERENTIAL/PLATELET
Abs Immature Granulocytes: 0.1 10*3/uL — ABNORMAL HIGH (ref 0.00–0.07)
Basophils Absolute: 0 10*3/uL (ref 0.0–0.1)
Basophils Relative: 0 %
Eosinophils Absolute: 0 10*3/uL (ref 0.0–0.5)
Eosinophils Relative: 1 %
HCT: 30.7 % — ABNORMAL LOW (ref 39.0–52.0)
Hemoglobin: 9.4 g/dL — ABNORMAL LOW (ref 13.0–17.0)
Immature Granulocytes: 2 %
Lymphocytes Relative: 22 %
Lymphs Abs: 1.2 10*3/uL (ref 0.7–4.0)
MCH: 25.9 pg — ABNORMAL LOW (ref 26.0–34.0)
MCHC: 30.6 g/dL (ref 30.0–36.0)
MCV: 84.6 fL (ref 80.0–100.0)
Monocytes Absolute: 0.5 10*3/uL (ref 0.1–1.0)
Monocytes Relative: 8 %
Neutro Abs: 3.6 10*3/uL (ref 1.7–7.7)
Neutrophils Relative %: 67 %
Platelets: 220 10*3/uL (ref 150–400)
RBC: 3.63 MIL/uL — ABNORMAL LOW (ref 4.22–5.81)
RDW: 13.8 % (ref 11.5–15.5)
WBC: 5.4 10*3/uL (ref 4.0–10.5)
nRBC: 0 % (ref 0.0–0.2)

## 2019-04-23 LAB — LIPASE, BLOOD: Lipase: 35 U/L (ref 11–51)

## 2019-04-23 LAB — URINALYSIS, ROUTINE W REFLEX MICROSCOPIC
Bacteria, UA: NONE SEEN
Bilirubin Urine: NEGATIVE
Glucose, UA: >=500 mg/dL — AB
Hgb urine dipstick: NEGATIVE
Ketones, ur: NEGATIVE mg/dL
Leukocytes,Ua: NEGATIVE
Nitrite: NEGATIVE
Protein, ur: 30 mg/dL — AB
Specific Gravity, Urine: 1.014 (ref 1.005–1.030)
pH: 5 (ref 5.0–8.0)

## 2019-04-23 LAB — COMPREHENSIVE METABOLIC PANEL
ALT: 16 U/L (ref 0–44)
AST: 24 U/L (ref 15–41)
Albumin: 3.5 g/dL (ref 3.5–5.0)
Alkaline Phosphatase: 55 U/L (ref 38–126)
Anion gap: 15 (ref 5–15)
BUN: 29 mg/dL — ABNORMAL HIGH (ref 8–23)
CO2: 17 mmol/L — ABNORMAL LOW (ref 22–32)
Calcium: 9 mg/dL (ref 8.9–10.3)
Chloride: 106 mmol/L (ref 98–111)
Creatinine, Ser: 1.66 mg/dL — ABNORMAL HIGH (ref 0.61–1.24)
GFR calc Af Amer: 48 mL/min — ABNORMAL LOW (ref >60)
GFR calc non Af Amer: 41 mL/min — ABNORMAL LOW (ref >60)
Glucose, Bld: 239 mg/dL — ABNORMAL HIGH (ref 70–99)
Potassium: 5.1 mmol/L (ref 3.5–5.1)
Sodium: 138 mmol/L (ref 135–145)
Total Bilirubin: 0.3 mg/dL (ref 0.3–1.2)
Total Protein: 7.1 g/dL (ref 6.5–8.1)

## 2019-04-23 LAB — SARS CORONAVIRUS 2 BY RT PCR (HOSPITAL ORDER, PERFORMED IN ~~LOC~~ HOSPITAL LAB): SARS Coronavirus 2: POSITIVE — AB

## 2019-04-23 MED ORDER — POLYETHYLENE GLYCOL 3350 17 G PO PACK: 17.0000 g | PACK | Freq: Two times a day (BID) | ORAL | 0 refills | Status: DC

## 2019-04-23 MED ORDER — FLEET ENEMA 7-19 GM/118ML RE ENEM
1.0000 | ENEMA | Freq: Once | RECTAL | Status: AC
  Administered 2019-04-23: 1 via RECTAL
  Filled 2019-04-23: qty 1

## 2019-04-23 MED ORDER — LIDOCAINE HCL URETHRAL/MUCOSAL 2 % EX GEL
1.0000 "application " | Freq: Once | CUTANEOUS | Status: AC
  Administered 2019-04-23: 1 via TOPICAL
  Filled 2019-04-23: qty 5

## 2019-04-23 MED ORDER — SODIUM CHLORIDE 0.9 % IV BOLUS
1000.0000 mL | Freq: Once | INTRAVENOUS | Status: AC
  Administered 2019-04-23: 1000 mL via INTRAVENOUS

## 2019-04-23 NOTE — Patient Outreach (Signed)
[facility]) Care Management  Rich   04/23/2019  Michael [name] Feb 23, 1951 [id number]  Reason for referral: Medication Management  Referral source: [place] Utilization Management Department Current insurance: [facility]  PMHx includes but not limited to:  Recent COVID illness, PE, HTN, HLD, HIV, gout, DM, hx crack cocaine abuse, CAD, atrial fibrillation, depression, and asthma  Outreach: Incoming call received from sister, [name].  HIPAA identifiers verified.  She reports that patient usually keeps medications together in a bag but as he is homeless and moves around, she often loses touch with him.  He often loses his wallet or phone.  He uses to get medications from [facility] and [place].  She has been unable to contact patient since he was discharged.  She will try to call him at the hotel today and encourage him to work with [facility].    Plan: Will outreach patient again next week as scheduled.    Ralene Bathe, PharmD, [place] [phone]

## 2019-04-23 NOTE — ED Triage Notes (Signed)
Per EMS, patient from restaurant, c/o constipation and abdominal pain. States last BM x1week ago. Recently admitted for Covid positive. Reports continued SOB, cough, fever, and muscle aches.  BP 134/90 HR 94 RR 16 O2 98% T 99.7 CBG 376

## 2019-04-23 NOTE — Discharge Instructions (Signed)
Take miralax twice daily for a week.   Stay hydrated.   You have coronavirus and please quarantine at home and do not go out.   See your doctor for follow up   Return to ER if you have worse abdominal pain, vomiting, fever, no bowel movement for a week.

## 2019-04-23 NOTE — ED Provider Notes (Signed)
Michael [name] Provider Note   CSN: [id number] Arrival date & time: 04/23/19  1439     History   Chief Complaint Chief Complaint  Patient presents with  . Constipation    HPI Rhiley Solem is a 68 y.o. male hx of atrial fibrillation, CAD, recent diagnosis of COVID here presenting with abdominal pain, constipation.  Patient was discharged from the hospital several days ago after he was diagnosed with COVID.  Patient states that since his admission about a week ago, he has no bowel movement.  He did go out to a restaurant yesterday and had worsening abdominal cramps.  He also noticed some lower abdominal pain as well.  Has some persistent muscle aches but denies actual fevers or vomiting.     The history is provided by the patient.    Past Medical History:  Diagnosis Date  . A-fib ([place])   . Asthma    No PFTs, history of childhood asthma  . CAD (coronary artery disease)    a. 06/2013 STEMI/PCI ([facility]): LAD w/ thrombus (treated with BMS), mid 75%, D2 75%; LCX OM2 75%; RCA small, PDA 95%, PLV 95%;  b. 10/2013 Cath/PCI: ISR w/in LAD (Promus DES x 2), borderline OM2 lesion;  c. 01/2014 MV: Intermediate risk, medium-sized distal ant wall infarct w/ very small amt of peri-infarct ischemia. EF 60%.  . Cellulitis 04/2014   left facial  . Chondromalacia of medial femoral condyle    Left knee MRI 04/28/12: Chondromalacia of the medial femoral condyle with slight peripheral degeneration of the meniscocapsular junction of the medial meniscus; followed by sports medicine  . Collagen vascular disease ([place])   . Crack cocaine use    for 20+ years, has been enrolled in detox programs in the past  . Depression    with history of hospitalization for suicidal ideation  . Diabetes mellitus 2002   Diagnosed in 2002, started insulin in 2012  . Gout   . Gout 04/28/2012  . Headache(784.0)    CT head 08/2011: Periventricular and subcortical white matter hypodensities are  most in keeping with chronic microangiopathic change  . HIV infection [facility]) Nov 2012   Followed by [name]  . Hyperlipidemia   . Hypertension   . Pulmonary embolism [facility])     Patient Active Problem List   Diagnosis Date Noted  . Respiratory distress 04/12/2019  . Pneumonia due to severe acute respiratory syndrome coronavirus 2 (SARS-CoV-2) 04/11/2019  . COVID-19 virus infection 03/19/2019  . Chest pain 10/03/2018  . Malnutrition of moderate degree 09/21/2018  . MDD (major depressive disorder), recurrent episode, moderate ([place])   . Type II diabetes mellitus with renal manifestations ([place]) 05/04/2018  . GERD (gastroesophageal reflux disease) 05/04/2018  . S/P CABG (coronary artery bypass graft)   . Left testicular pain   . Depression 02/20/2018  . Epididymo-orchitis, acute 02/20/2018  . Essential hypertension 02/20/2018  . Precordial chest pain 02/20/2018  . AKI (acute kidney injury) ([place]) 02/14/2018  . HCAP (healthcare-associated pneumonia) 02/14/2018  . History of pulmonary embolism 07/04/2017  . Acute hyponatremia 05/11/2017  . Hyperglycemia due to type 2 diabetes mellitus ([place]) 05/11/2017  . CHF (congestive heart failure) ([place]) 04/01/2017  . Hepatitis C 12/30/2016  . Chronic ischemic heart disease 11/12/2016  . Paroxysmal A-fib ([place]) 11/12/2016  . Back pain 04/18/2016  . S/P carotid endarterectomy 11/15/2015  . MDD (major depressive disorder), recurrent severe, without psychosis ([place]) 09/09/2015  . Cocaine-induced mood disorder ([place]) 08/14/2015  . Cocaine abuse  with cocaine-induced mood disorder ([place]) 08/14/2015  . Gout 07/10/2015  . Acute renal failure superimposed on stage 3 chronic kidney disease ([place]) 03/06/2015  . Normocytic anemia 03/06/2015  . Chronic kidney disease, stage III (moderate) ([place]) 03/06/2015  . Hypoglycemia   . Encounter for general adult medical examination with abnormal findings 02/09/2015  . Cocaine use disorder, severe, dependence ([place])  12/13/2014  . Substance or medication-induced depressive disorder with onset during withdrawal ([place]) 12/13/2014  . Severe recurrent major depressive disorder with psychotic features ([place]) 12/12/2014  . Cervicalgia 06/28/2014  . Lumbar radiculopathy, chronic 06/28/2014  . Asthma, chronic 02/03/2014  . 3-vessel CAD 06/24/2013  . ED (erectile dysfunction) of organic origin 07/07/2012  . Hypertension goal BP (blood pressure) < 140/80 04/29/2012  . Chondromalacia of left knee 03/19/2012  . Hyperlipidemia with target LDL less than 100 02/12/2012  . Fibromyalgia 02/12/2012  . HIV (human immunodeficiency virus infection) ([place]) 08/27/2011  . Uncontrolled type 2 diabetes with neuropathy ([place]) 10/17/2000    Past Surgical History:  Procedure Laterality Date  . BACK SURGERY     1988  . BOWEL RESECTION    . CARDIAC SURGERY    . CERVICAL SPINE SURGERY     " rods in my neck "  . CORONARY ARTERY BYPASS GRAFT    . CORONARY STENT PLACEMENT    . NM MYOCAR PERF WALL MOTION  12/27/2011   normal  . SPINE SURGERY          Home Medications    Prior to Admission medications   Medication Sig Start Date End Date Taking? Authorizing Provider  albuterol (PROAIR HFA) 108 (90 Base) MCG/ACT inhaler INHALE TWO PUFFS INTO THE LUNGS EVERY 6 (SIX) HOURS AS NEEDED FOR WHEEZING OR SHORTNESS OF BREATH. Patient taking differently: Inhale 2 puffs into the lungs every 6 (six) hours as needed for wheezing or shortness of breath.  11/24/17   Lindell Spar I, NP  allopurinol (ZYLOPRIM) 300 MG tablet Take 1 tablet (300 mg total) by mouth daily. For gout. 11/25/17   Lindell Spar I, NP  aspirin 81 MG chewable tablet Chew 81 mg by mouth daily.    [provider]  benzonatate (TESSALON) 100 MG capsule Take 2 capsules (200 mg total) by mouth 3 (three) times daily as needed for cough. 03/21/19   Ghimire, Henreitta Leber, MD  blood glucose meter kit and supplies KIT Dispense based on patient and insurance preference. Use up to  four times daily as directed. (FOR ICD-9 250.00, 250.01). For QAC - HS accuchecks. 04/20/19   Thurnell Lose, MD  emtricitabine-rilpivir-tenofovir AF (ODEFSEY) 200-25-25 MG TABS tablet Take 1 tablet by mouth daily with breakfast. 03/04/19   Kuppelweiser, Cassie L, RPH-CPP  famotidine (PEPCID) 20 MG tablet Take 1 tablet (20 mg total) by mouth 2 (two) times daily. 04/19/19   Thurnell Lose, MD  Fluticasone Propionate, Inhal, (FLOVENT DISKUS) 100 MCG/BLIST AEPB Inhale 1 puff into the lungs 2 (two) times daily.    [provider]  gabapentin (NEURONTIN) 400 MG capsule Take 1 capsule (400 mg total) by mouth 2 (two) times daily. For agitation/neuropathic pain 11/24/17   Lindell Spar I, NP  guaiFENesin (MUCINEX) 600 MG 12 hr tablet Take 1 tablet (600 mg total) by mouth 2 (two) times daily. Patient taking differently: Take 600 mg by mouth 2 (two) times daily as needed for cough or to loosen phlegm.  10/07/18   Lavina Hamman, MD  hydrOXYzine (ATARAX/VISTARIL) 25 MG tablet Take 1 tablet (  25 mg total) by mouth 3 (three) times daily as needed for anxiety. 11/24/17   Lindell Spar I, NP  insulin aspart (NOVOLOG) 100 UNIT/ML injection Before each meal 3 times a day , 140-199 - 2 units, 200-250 - 4 units, 251-299 - 6 units,  300-349 - 8 units,  350 or above 10 units. Insulin PEN if approved, provide syringes and needles if needed. Can switch to any other cheaper alternative. 04/19/19   Thurnell Lose, MD  insulin glargine (LANTUS) 100 UNIT/ML injection Inject 0.2 mLs (20 Units total) into the skin at bedtime. Dispense insulin pen if approved, if not dispense as needed syringes and needles for 1 month supply. Can switch to Levemir. Diagnosis E 11.65. 04/19/19   Thurnell Lose, MD  Insulin Syringe-Needle U-100 25G X 1" 1 ML MISC For 4 times a day insulin SQ, 1 month supply. Diagnosis E11.65 04/16/19   Thurnell Lose, MD  metFORMIN (GLUCOPHAGE) 1000 MG tablet Take 1,000 mg by mouth 2 (two) times daily.  03/04/19   [provider]  nystatin (MYCOSTATIN) 100000 UNIT/ML suspension Take 5 mLs (500,000 Units total) by mouth 4 (four) times daily for 7 days. 04/19/19 04/26/19  Thurnell Lose, MD  sertraline (ZOLOFT) 50 MG tablet Take 1 tablet (50 mg total) by mouth daily. 09/22/18   Lady Deutscher, MD  traZODone (DESYREL) 50 MG tablet Take 1 tablet (50 mg total) by mouth at bedtime as needed for sleep. 04/16/19   Thurnell Lose, MD    Family History Family History  Problem Relation Age of Onset  . Diabetes Mother   . Hypertension Mother   . Hyperlipidemia Mother   . Diabetes Father   . Cancer Father   . Hypertension Father   . Diabetes Brother   . Heart disease Brother   . Diabetes Sister   . Colon cancer Neg Hx     Social History Social History   Tobacco Use  . Smoking status: Never Smoker  . Smokeless tobacco: Never Used  Substance Use Topics  . Alcohol use: No    Alcohol/week: 4.0 standard drinks    Types: 2 Cans of beer, 2 Shots of liquor per week  . Drug use: Not Currently    Types: "Crack" cocaine, Cocaine    Comment: last used sometime in April     Allergies   Patient has no known allergies.   Review of Systems Review of Systems  Gastrointestinal: Positive for constipation.  All other systems reviewed and are negative.    Physical Exam Updated Vital Signs BP (!) 161/110   Pulse 77   Temp 98.3 F (36.8 C) (Oral)   Resp 14   Ht '5\' 8"'  (1.727 m)   Wt 74.4 kg   SpO2 100%   BMI 24.94 kg/m   Physical Exam Vitals signs and nursing note reviewed.  HENT:     Head: Normocephalic.     Nose: Nose normal.     Mouth/Throat:     Mouth: Mucous membranes are moist.  Eyes:     Extraocular Movements: Extraocular movements intact.     Pupils: Pupils are equal, round, and reactive to light.  Neck:     Musculoskeletal: Normal range of motion.  Cardiovascular:     Rate and Rhythm: Normal rate and regular rhythm.     Pulses: Normal pulses.   Pulmonary:     Effort: Pulmonary effort is normal.  Abdominal:     General: Abdomen is flat.  Comments: Mild LLQ tenderness   Genitourinary:    Comments: Rectal- stool impaction  Musculoskeletal: Normal range of motion.  Skin:    General: Skin is warm.     Capillary Refill: Capillary refill takes less than 2 seconds.  Neurological:     General: No focal deficit present.     Mental Status: He is alert and oriented to person, place, and time.  Psychiatric:        Mood and Affect: Mood normal.        Behavior: Behavior normal.      ED Treatments / Results  Labs (all labs ordered are listed, but only abnormal results are displayed) Labs Reviewed  CBC WITH DIFFERENTIAL/PLATELET - Abnormal; Notable for the following components:      Result Value   RBC 3.63 (*)    Hemoglobin 9.4 (*)    HCT 30.7 (*)    MCH 25.9 (*)    Abs Immature Granulocytes 0.10 (*)    All other components within normal limits  COMPREHENSIVE METABOLIC PANEL - Abnormal; Notable for the following components:   CO2 17 (*)    Glucose, Bld 239 (*)    BUN 29 (*)    Creatinine, Ser 1.66 (*)    GFR calc non Af Amer 41 (*)    GFR calc Af Amer 48 (*)    All other components within normal limits  URINALYSIS, ROUTINE W REFLEX MICROSCOPIC - Abnormal; Notable for the following components:   Glucose, UA >=500 (*)    Protein, ur 30 (*)    All other components within normal limits  SARS CORONAVIRUS 2 (HOSPITAL ORDER, [place] [facility])  LIPASE, BLOOD    EKG None  Radiology Dg Abd Portable 1 View  Result Date: 04/23/2019 CLINICAL DATA:  68 year old with possible constipation as he states his last bowel movement was approximately 1 week ago. Patient complains of rectal pain. COVID-19 positive approximately 2 months ago. EXAM: PORTABLE ABDOMEN - 1 VIEW COMPARISON:  CT chest abdomen and pelvis 09/02/2018. FINDINGS: Bowel gas pattern unremarkable without evidence of obstruction or significant  ileus. Large stool burden in the colon, particularly the descending colon, sigmoid colon and rectum. Phleboliths in the pelvis. No visible opaque urinary tract calculi. Prior POSTERIOR lumbosacral fusion. Severe degenerative changes involving the LEFT hip and mild degenerative changes involving the RIGHT hip. IMPRESSION: No acute abdominal abnormality. Large colonic stool burden. Electronically Signed   By: Evangeline Dakin M.D.   On: 04/23/2019 17:35    Procedures Fecal disimpaction  Date/Time: 04/23/2019 10:59 PM Performed by: Drenda Freeze, MD Authorized by: Drenda Freeze, MD  Consent: Verbal consent obtained. Risks and benefits: risks, benefits and alternatives were discussed Consent given by: patient Patient understanding: patient states understanding of the procedure being performed Patient consent: the patient's understanding of the procedure matches consent given Procedure consent: procedure consent matches procedure scheduled Relevant documents: relevant documents present and verified Patient identity confirmed: verbally with patient Time out: Immediately prior to procedure a "time out" was called to verify the correct patient, procedure, equipment, support staff and site/side marked as required. Local anesthesia used: yes  Anesthesia: Local anesthesia used: yes Local Anesthetic: topical anesthetic  Sedation: Patient sedated: no  Patient tolerance: patient tolerated the procedure well with no immediate complications    (including critical care time)  Medications Ordered in ED Medications  sodium phosphate (FLEET) 7-19 GM/118ML enema 1 enema (has no administration in time range)  sodium chloride 0.9 % bolus  1,000 mL (1,000 mLs Intravenous New Bag/Given 04/23/19 1755)  lidocaine (XYLOCAINE) 2 % jelly 1 application (1 application Topical Given 04/23/19 1739)     Initial Impression / Assessment and Plan / ED Course  I have reviewed the triage vital signs and the  nursing notes.  Pertinent labs & imaging results that were available during my care of the patient were reviewed by me and considered in my medical decision making (see chart for details).       Narayan Scull is a 68 y.o. male recently positive for COVID and here presenting with constipation.  Patient has stool impaction on my clinical exam.  Minimal left lower quadrant tenderness and had no fever.  We will test him for COVID as well as get acute abdominal series as well as labs.  Will perform digital disimpaction.  7 pm Digital disimpaction performed.  Patient still unable to have a bowel movement so we will try an enema.  His labs and x-rays were unremarkable.  10:59 PM Still COVID positive but has no oxygen requirement. Had large bowel movement after enema. Will start on miralax. Told him to not go to restaurants and to quarantine himself at home   Final Clinical Impressions(s) / ED Diagnoses   Final diagnoses:  None    ED Discharge Orders    None       Drenda Freeze, MD 04/23/19 2300

## 2019-04-26 ENCOUNTER — Other Ambulatory Visit: Payer: Self-pay

## 2019-04-26 ENCOUNTER — Telehealth: Payer: Self-pay | Admitting: Physician Assistant

## 2019-04-26 ENCOUNTER — Other Ambulatory Visit: Payer: Self-pay | Admitting: *Deleted

## 2019-04-26 NOTE — Telephone Encounter (Signed)
-----   Message from [name], MD sent at 04/26/2019  6:45 AM EDT ----- Regarding: needs appt sooner This pt has a post hospital appt 8/3 with me, he needs a tele visit sooner this week with me, ok to put on [place] schedule if need be.   This is a HFU COVID + case so needs to be Tele visit: prefer webex or Mychart video please, if pt not able to do this , telephone visit ok.  [name]

## 2019-04-26 NOTE — Telephone Encounter (Signed)
Called pt and left message to call back to rsch his appt

## 2019-04-26 NOTE — Patient Outreach (Signed)
[facility]) Care Management  04/26/2019  Michael [name] Oct 18, 1950 [id number]   Weekly transition of care call placed to member to follow up on current status as well as follow up on recent ED visit, no answer when connected to hotel room.  Unable to leave a message.  Will follow up within the next 3-4 business days.  Valente David, [place], MSN [place] [phone]

## 2019-04-26 NOTE — Patient Outreach (Signed)
[place] [facility]) Care Management  04/26/2019  Jermar Colter [birth year] [id number]   Social work referral received from [facility], [employer]. "Patient positive for Covid 19, discharged to [facility] for 14 day quarantine. Having meals delivered while staying there. Once quarantine complete, will be going to homeless shelter through [facility] [employer]. He is unsure of what date he will need to be out of hotel. Need for transportation resources, depends on brother (not always available) and bus. Would like sister, [name], to be his POA but has not had discussion with her or completed paperwork."  Initial outreach to patient today was successful.  Patient reported during call that he misplaced is ID and is trying to get it replaced.  Per patient, an appointment is not available at [facility] until mid August.  Patient does not have Internet access but BSW was able to access DMV site and it appears that a duplicate ID can be ordered free of charge.  Before we were able to start the online request, patient reported that he was contacted by "somebody" yesterday who told him he would have to be picked up from the [facility] today and taken to the shelter in [facility].  BSW inquired several times about who contacted him but patient did not know.  BSW informed patient that, per multiple notes in his EHR, he should remain in quarantine at [facility] for 14 days due to testing positive for [place].  BSW expressed the importance of him remaining in quarantine. BSW agreed to make some calls regarding what he reported.  BSW provided patient with name and contact information.  BSW asked patient to please get contact information if he is contacted by anyone today regarding status of stay at [facility].  BSW collaborated with [place], Valente David who agreed to contact patient and reiterate importance of remaining in quarantine.  BSW left voicemail message for LCSW, Pricilla Riffle, who assisted with  transition from hospital to [facility].   BSW contacted [employer] to confirm that patient has transportation benefits. BSW attempted x2 to call patient back to further discuss the importance of remaining in quarantine, transportation resources, and Forensic scientist documentation.  Number was busy at first attempt and no answer at second attempt.  Will attempt to reach him again tomorrow.  Ronn Melena, BSW Social Worker [phone]

## 2019-04-27 ENCOUNTER — Other Ambulatory Visit: Payer: Self-pay

## 2019-04-27 ENCOUNTER — Ambulatory Visit: Payer: Self-pay

## 2019-04-27 NOTE — Patient Outreach (Signed)
[place] [facility]) Care Management  04/27/2019  Michael [name] 05-26-1951 [id number]   Incoming call from patient this morning.  Patient asked about assistance with ordering new ID online.  BSW informed him about two attempts to call him back; first time line was busy, second time he did not answer. When patient called, BSW was out in community completing task for another patient and was unable to complete online request for duplicate ID.  Agreed to call him back.  Attempted x3 to call back; each time there was no answer.   Will try again tomorrow to connect with him.  Michael [name], BSW Social Worker [phone]

## 2019-04-28 ENCOUNTER — Other Ambulatory Visit: Payer: Self-pay

## 2019-04-28 ENCOUNTER — Other Ambulatory Visit: Payer: Self-pay | Admitting: *Deleted

## 2019-04-28 NOTE — Patient Outreach (Signed)
[facility]) Care Management  04/28/2019  Michael [name] 22-Sep-1951 [id number]   Successful outreach to patient today.   BSW completed online request to order duplicate ID which will be mailed to sisters home.   BSW informed patient that he has transportation benefits with [employer].  Attempted to provide him with contact information but the pen he had was not working so he was unable to take down the number. BSW and patient discussed housing once he is no longer in quarantine at [facility].  Patient stated that he would like to locate an apartment in [place] versus going to [employer].  BSW explained to patient that locating an apartment in less than two weeks while he is in quarantine is not an option.  BSW encouraged him to follow through with the plan of going to a shelter.  Patient consented to BSW calling sister to inform her that ID will be mailed to her address.  BSW called and informed her of this.  BSW talked with his sister about assisting patient with replacing his phone so that CM team can remain in contact with him after leaving the [place].  BSW asked patient about when his isolation period will be complete but he was unsure.  BSW contacted Felecia with [facility].  Per Felecia, his isolation was complete on 04/26/19 and he was supposed to transition to [name] that day.  Per Babs Sciara, he is no longer able to remain at [facility] and will likely be picked up today and taken to [facility] in [place]. She agreed to follow up with me once plan is finalized.  BSW called patient back to inform him of this.  BSW confirmed that patient has my contact information so that we can attempt to stay connected when he transitions to [facility].  Attempted to provide him with contact information for [facility], [facility], but he did not have any way to write down her information.   Michael [name], BSW Social  Worker [phone]

## 2019-04-28 NOTE — Patient Outreach (Signed)
[facility]) Care Management  04/28/2019  Tryone Kille 01-19-1951 [id number]   Weekly transition of care call placed to member at hotel.  He report still having a cough, especially at night.  Advised of recovery of Covid and pneumonia and lingering cough.  Denies it is getting worse.  State his constipation is better, still has some abdominal discomfort intermittently.  Admits he has only checked his blood sugar "once or twice" over the past week, state readings have been greater than 200.  He has not been in contact with [facility] pharmacist to review medications and doses, advised to contact.  This care manager inquired about contact with [facility] for visits, denies any contact or visit scheduled.  Call placed to [facility], spoke with Ronalee Belts and informed that they are not able to take member's case.    He does express concern regarding having to go to shelter once he leave the hotel.  Would like to find an apartment instead.  State he does not have support of family and still does not have a phone.  Report having insurance on the phone, advised to ask his sister to help with obtaining new one.  Also state he is in need of clothes as well as transportation.  Report he took the public bus to pharmacy yesterday.  Reminded of Covid precautions and need for quarantine.  Informed BSW of member's needs.  He expresses interest in counseling for psych issues, having thoughts of being better off dead almost every day.  Denies feeling suicidal at this time.  BSW will have CSW contact for counseling.  Member does not have follow up appointments scheduled with ID or GI specialists as recommended per AVS.  Calls placed to both offices, ID appointment confirmed with 7/30.  GI report they need a referral prior to scheduling.  Call then placed to [facility], unable to speak to someone in office due to hold time.  Message sent to care management coordinator, Etter Sjogren, to report concerns of not starting home  health as well as request for referral to GI.    Member denies any urgent concerns at this time, will follow up within the next week.  THN CM Care Plan Problem One     Most Recent Value  Care Plan Problem One  Risk for hospital readmission related to inability to manage chronic medical conditions as evidenced by homelessness and recent hospitalization  Role Documenting the Problem One  Care Management [place] for Problem One  Active  [facility] Long Term Goal   Member will not be readmitted to hospital within the next 31 days  THN Long Term Goal Start Date  04/22/19  Interventions for Problem One Long Term Goal  Call placed to GI and ID specialists to schedule follow up appointments.  Collaborated with Case management coordinator at PCP office in effort to establish plan of care to decrease risk of readmission  THN CM Short Term Goal #1   Member will be able to verbalize plan for support after checking out of hotel within the next 2 weeks  THN CM Short Term Goal #1 Start Date  04/22/19  Interventions for Short Term Goal #1  Collaborated with [facility] BSW regarding plan for after leaving hotel. Provided member with contact information for social worker to follow up on resources  [facility] CM Short Term Goal #2   Member will report taking all medications as indicated over the next 4 weeks  THN CM Short Term Goal #2  Start Date  04/22/19  Interventions for Short Term Goal #2  Advised member of [facility] pharmacist attempts to contact him for medication assistance, provided him with contact information for pharmacist  [facility] CM Short Term Goal #3  Member will complete scheduled follow up appointment with PCP within the next 2 weeks  THN CM Short Term Goal #3 Start Date  04/22/19  Interventions for Short Tern Goal #3  Reminded of upcoming appointment and imortances of keeping in contact with care team once leaving the hotel     Valente David, [place], MSN [place] [phone]

## 2019-04-29 ENCOUNTER — Telehealth: Payer: Self-pay

## 2019-04-29 ENCOUNTER — Other Ambulatory Visit: Payer: Self-pay | Admitting: Pharmacist

## 2019-04-29 ENCOUNTER — Ambulatory Visit: Payer: Self-pay | Admitting: Pharmacist

## 2019-04-29 NOTE — Patient Outreach (Addendum)
[facility]) Care Management  [place]  04/29/2019  Abas Leicht June 06, 1951 [id number]   Reason for referral: Medication Management  Referral source: [place] Utilization Management Department Current insurance: [facility]  Outreach:  Unsuccessful telephone call attempt #2 to patient.   Unable to leave message, No answer from [facility].  Per notes, patient already left [facility] yesterday as his 14 days of quarantine (from date of diagnosis of COVID) are complete.    Patient still does not have phone and is staying at the [place] [facility] for now.  Message left with his sister, [name], to find out if there is any way to keep communication open with patient.    Plan:  -Will await call back from sister.  If no response, will contact her again in 3-4 days.   Ralene Bathe, PharmD, [place] [phone]   Incoming call from sister.  She reports patient still does not have a phone.  He has re-ordered his license which he needs to go to the bank / pick up a phone.  She will keep my contact information and provide it to patient once he is able to engage / communicate with [facility] with phone.     Will close [place] case at this time as I am unable to engage with patient.   Ralene Bathe, PharmD, [place] [phone]

## 2019-04-29 NOTE — Telephone Encounter (Signed)
Call placed to [facility], RN/THN. Informed her that the patient has an appointment scheduled with Dr Joya Gaskins on 05/10/2019 and he can assess need for GI referral and also need for home health.  The patient would need a face to face encounter with a provider prior to a home health referral being placed. Nanine Means was not able to accept the referral when the patient was discharged from the hospital.   As per [facility], he left the [facility] yesterday when his 14 day stay was complete.  He was supposed to be discharged to the [facility] but she is not sure if he is there. Provided her with the phone number for [employer], [place].  He will need to provide a contact phone number for his appointment on 05/10/2019.

## 2019-04-30 ENCOUNTER — Other Ambulatory Visit: Payer: Self-pay | Admitting: *Deleted

## 2019-04-30 NOTE — Patient Outreach (Signed)
[facility]) Care Management  04/30/2019  Michael [name] 1951-03-11 [id number]   Call placed to Nelson Chimes, nurse at [facility], to inquire about member's check in at the shelter.  She's out in the field at the time, will check the registration list upon her return back to the office and call this care manager back.    Update:    Call received back from [facility], informed that member never showed up at [facility].    Call then placed to sister [name].  She state she was also under the impression that member was at [name].  Denies any contact with member over the past few days. She will continue attempts to reach him.  Will follow up with sister within the next week.  If she still have not had any contact, will consider case closure due to inability to maintain contact.  Valente David, [place], MSN [place] [phone]

## 2019-05-04 ENCOUNTER — Ambulatory Visit: Payer: Self-pay | Admitting: Pharmacist

## 2019-05-05 ENCOUNTER — Other Ambulatory Visit: Payer: Self-pay | Admitting: *Deleted

## 2019-05-05 ENCOUNTER — Other Ambulatory Visit: Payer: Self-pay

## 2019-05-05 NOTE — Patient Outreach (Signed)
[facility]) Care Management  05/05/2019  Michael [name] August 14, 1951 [id number]   [facility] BSW closing case due to inability to maintain contact.   Ronn Melena, BSW Social Worker [phone]

## 2019-05-05 NOTE — Patient Outreach (Signed)
[facility]) Care Management  05/05/2019  Michael [name] [birth year] [id number]   Call placed to member's sister [name] to inquire about contact.  She report him calling her on Sunday, stated he was at the [facility] Room [facility].  She has not been in contact with him since then.  She is only in contact with him when he initiate the call as he still does not have a phone.  Call then placed to the [facility] however desk clerk unable to verify member is a resident there or connect to room unless this care manager is able to provide a booking ID number.  This information is not available therefore remain unable to contact member.  Will close case at this time due to inability to maintain contact.  Sister is aware to contact [facility] if she is able to remain in contact with member consistently.  Will notify primary MD office of case closure.  Valente David, [place], MSN [place] [phone]

## 2019-05-06 ENCOUNTER — Inpatient Hospital Stay: Payer: Medicare Other | Admitting: Infectious Diseases

## 2019-05-09 NOTE — Progress Notes (Deleted)
Subjective:    Patient ID: Michael [name], male    DOB: 05/05/1951, 68 y.o.   MRN: [id number]  Admit date: 04/11/2019  Discharge date: 04/20/2019  Admitted From: Home   Disposition:  Hotel   Recommendations for Outpatient Follow-up:   Follow up with PCP in 1-2 weeks  PCP Please obtain BMP/CBC, 2 view CXR in 1week,  (see Discharge instructions)   PCP Please follow up on the following pending results:    Home Health: PT,RN, S Work   Equipment/Devices: Chiropodist Consultations: None  Discharge Condition: Stable    CODE STATUS: Full    Diet Recommendation: Low carbohydrate diet.     Chief Complaint Patient presents with . Shortness of Breath    Brief history of present illness from the day of admission and additional interim summary   Patient is a 68 year old African American male with past medical history significant for recent COVID-19 illness for which patient was admitted and discharged from the hospital about 2 weeks ago, pulmonary embolism, hypertension, hyperlipidemia, HIV, gout, diabetes mellitus, depression, crack cocaine use, coronary artery disease, asthma and atrial fibrillation.Patient presents with new onset of shortness of breath, weakness and fatigue that started about 3 days ago. Patient also reports pain light headache, dizzy, with associated nausea, vomiting and diarrhea. Repeat COVID-19 test was positive and he was admitted for further care.                                                                  Hospital Course    1. Acute Covid 19 Viral Pneumonitis during the ongoing 2020 Covid 19 Pandemic - no hypoxia or oxygen need, stable inflammatory markers, conservative management with supportive care only.  2. Poorly controlled DM type II without DKA. Patient compliance with medications on Lantus and sliding scale. Extremely poor outpatient control due to hyperglycemia.   He was provided with diabetic and insulin  education here prior to discharge.  New prescriptions also provided along with testing supplies.  CBG (last 3)  Recent Labs (last 2 labs)  Recent Labs   04/19/19 2139 04/20/19 0607 04/20/19 0734 GLUCAP 256* 156* 127*    3. HIV. Under the care of [name] Home medications continued.  4. AKI on CKD stage III. After hydration back to baseline.  5. Generalized weakness and deconditioning.   Improved ambulated for me in the room by himself with the help of a walker, sat in the chair by himself, CNA witnessed, will be discharged per arrangements made by SNF with PT, RN and social work follow-up along with a 5 wheeled rolling walker, patient satisfied with the plan.  6. Esophagitis along with mild odynophagia. Placed on nystatin along with Pepcid.  Was initially placed on soft diet now symptoms much better, advance to regular consistency low carbohydrate diet, encouraged to sit up in chair rather than in bed, his symptoms have improved but he will definitely require outpatient GI follow-up and EGD.  7. Intermittent hiccups likely due to #6 above.  Resolved after treatment for #6 above and few doses of Thorazine.  8. Ongoing cocaine abuse. Counseled to quit.   Discharge diagnosis    Principal Problem:   Respiratory distress Active Problems:   Uncontrolled type 2 diabetes with neuropathy (  [place])   Asthma, chronic   Chronic kidney disease, stage III (moderate) ([place])      Review of Systems     Objective:   Physical Exam        Assessment & Plan:

## 2019-05-10 ENCOUNTER — Inpatient Hospital Stay: Payer: Medicare Other | Admitting: Critical Care Medicine

## 2019-05-20 NOTE — Telephone Encounter (Signed)
Spoke with patient's sister, [name], and was updated on the patient's living situation. He was recently hospitalized for Covid and no longer followed by case management for the delivery of his medications. He is waiting for an identification card in order to receive his phone. He has left the [place] and was homeless before being placed at the [facility]. The staff at the front desk will not allow calls to be transferred to his room. [name] stated that she only talks to the patient when he calls her and has no way of getting in touch with him otherwise. He missed his 07/30 clinic appointment. His medication was last picked up on 03/30/2019 from [facility]. Left multiple messages with his sister to call us about his medication but to this date she had not heard from him and the pharmacy unable to reach him as well. Made a note in the pharmacy system about the patient's living and phone situation as follow-up if he calls in to request a refill.

## 2019-05-22 IMAGING — US US SCROTUM W/ DOPPLER COMPLETE
1 series · 14 of 25 positions shown · non-contrast
Comparison: None.

CLINICAL DATA: 66-year-old male with acute LEFT testicular pain for
3 days.

EXAM:
SCROTAL ULTRASOUND
DOPPLER ULTRASOUND OF THE TESTICLES
TECHNIQUE: Complete ultrasound examination of the testicles, epididymis, and
other scrotal structures was performed. Color and spectral Doppler
ultrasound were also utilized to evaluate blood flow to the
testicles.

[Series 1: us scrotum w/ doppler complete · 0.06mm/px · 14 of 46 slices shown]
[im 1/46]
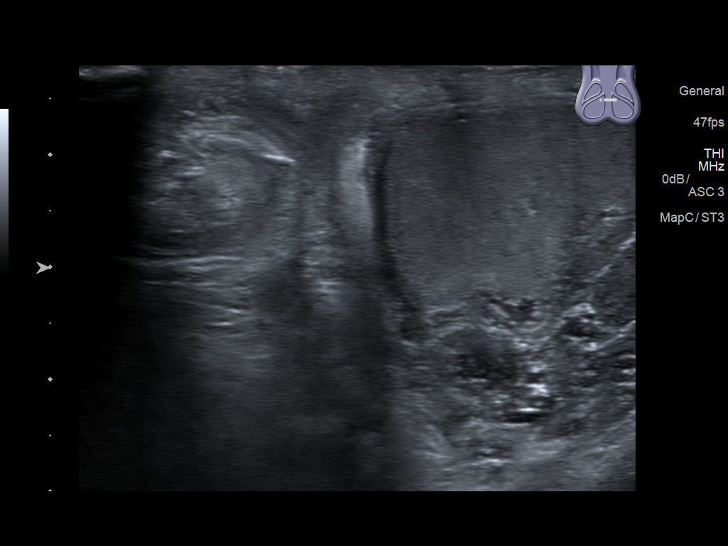
[im 4/46]
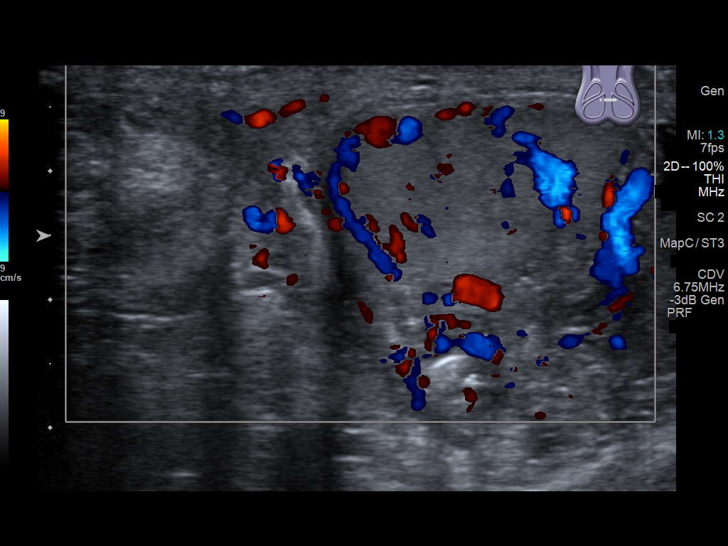
[im 8/46]
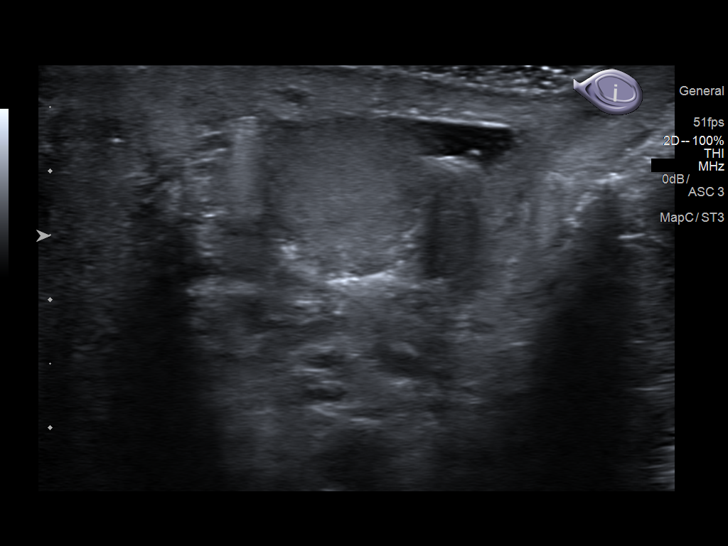
[im 12/46]
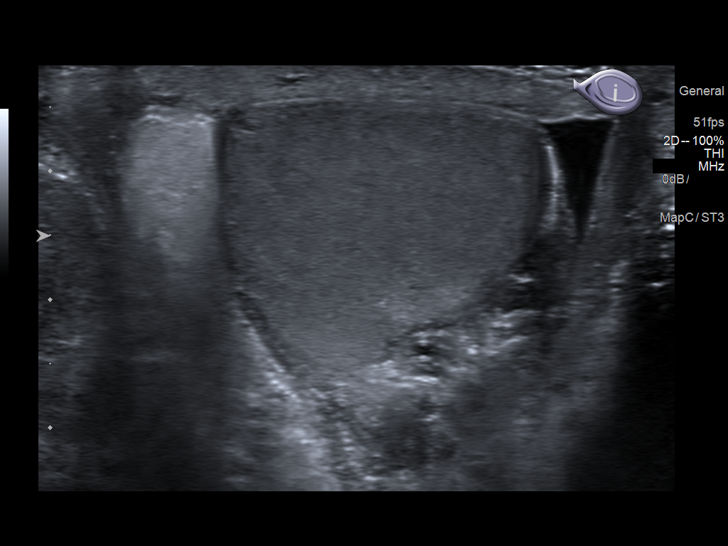
[im 16/46]
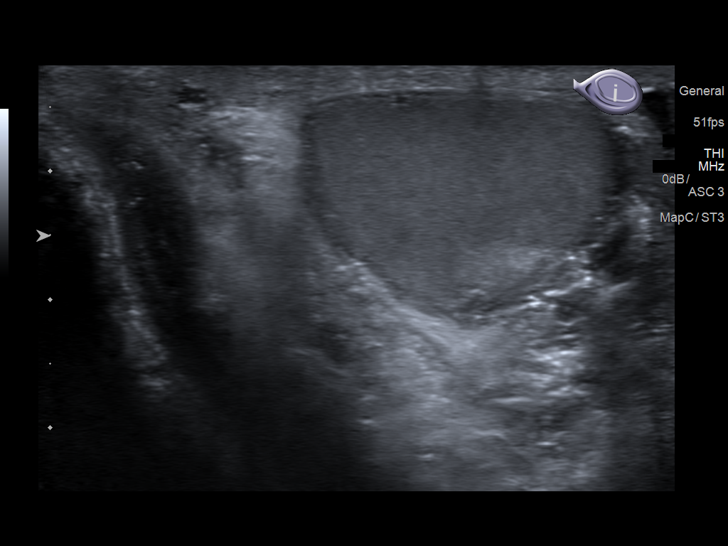
[im 17/46]
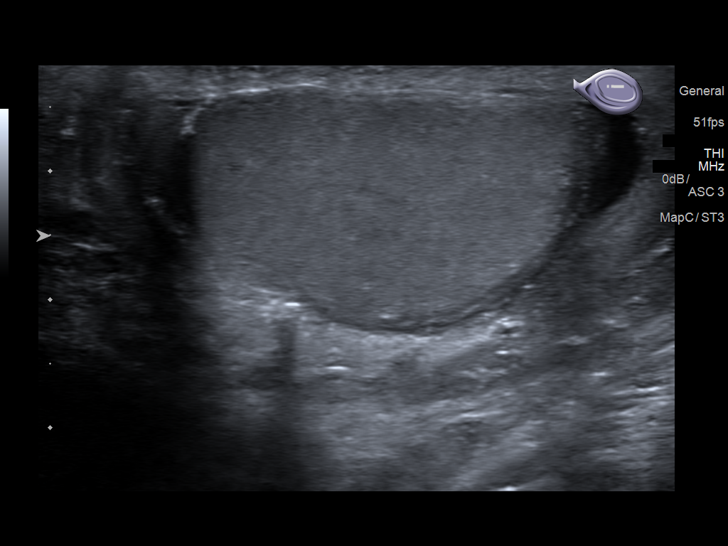
[im 21/46]
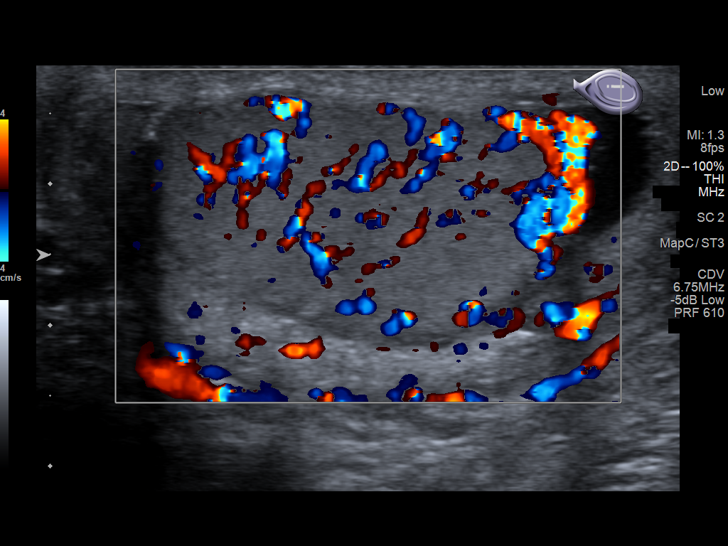
[im 25/46]
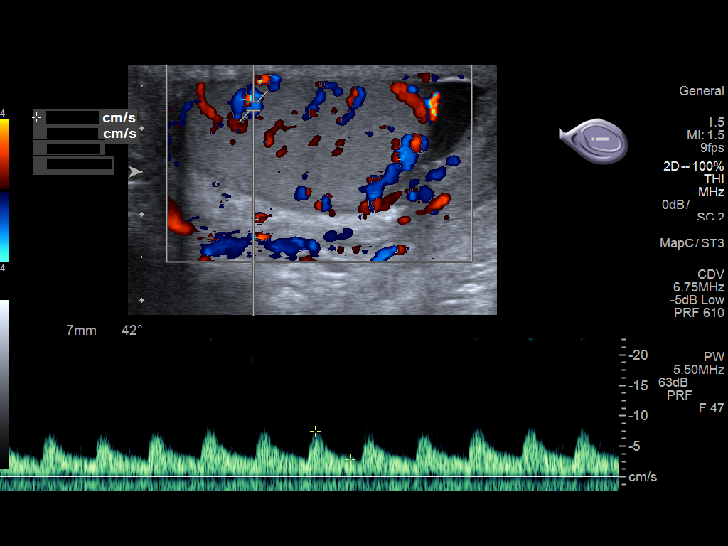
[im 29/46]
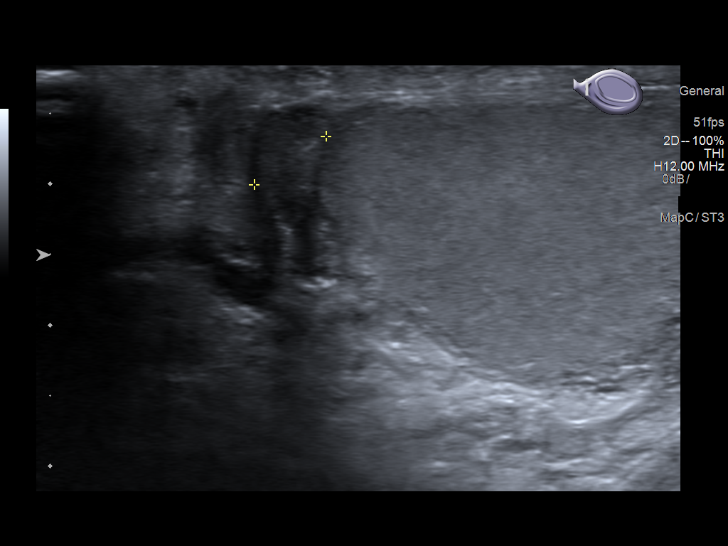
[im 31/46]
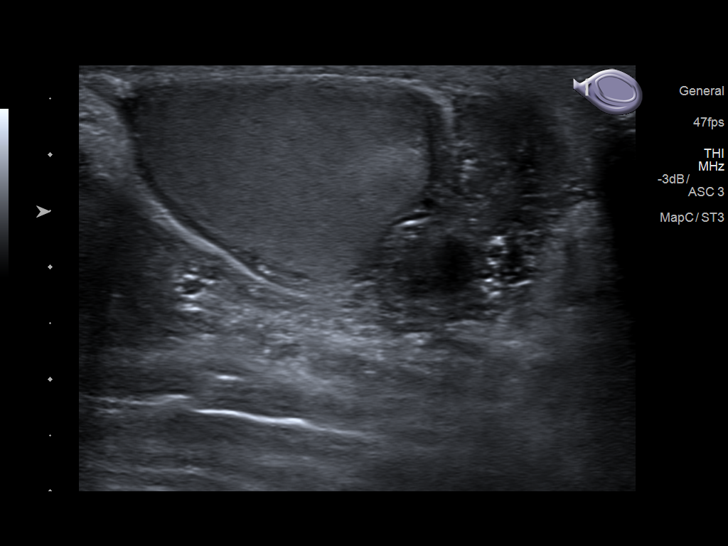
[im 34/46]
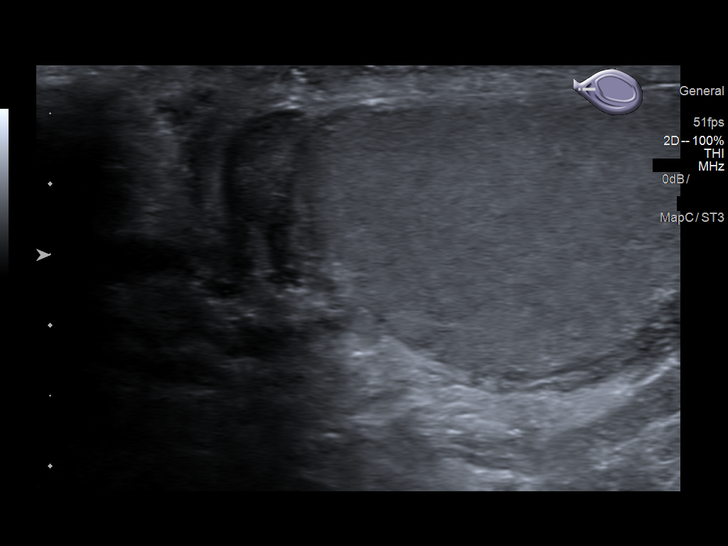
[im 38/46]
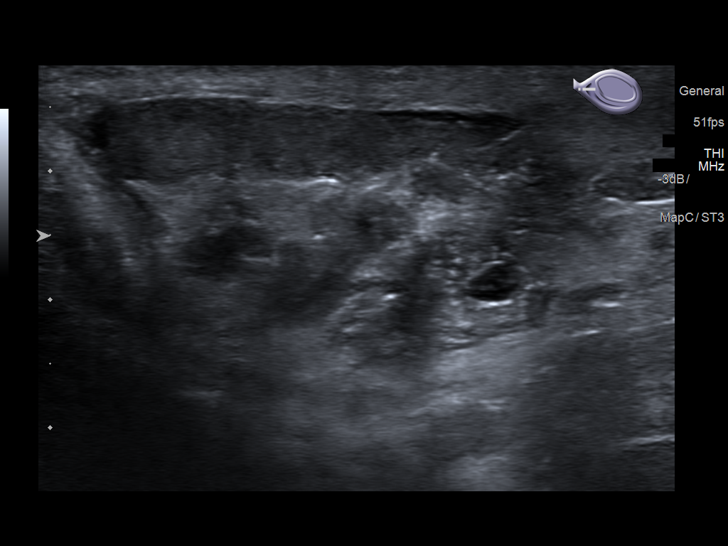
[im 42/46]
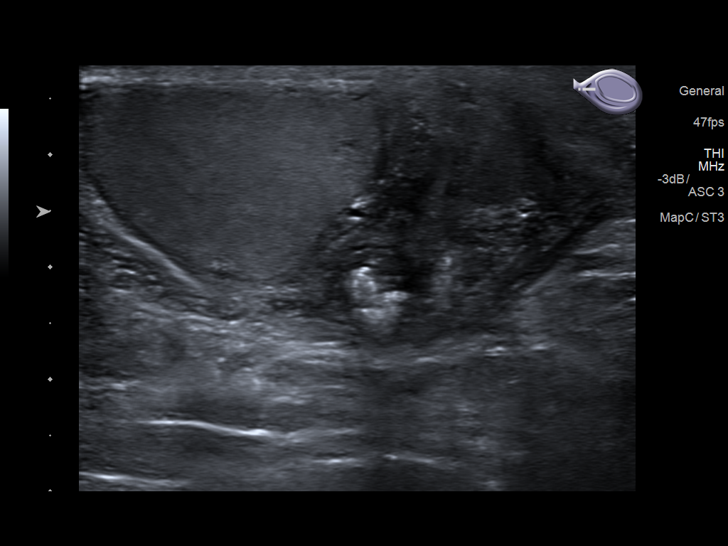
[im 46/46]
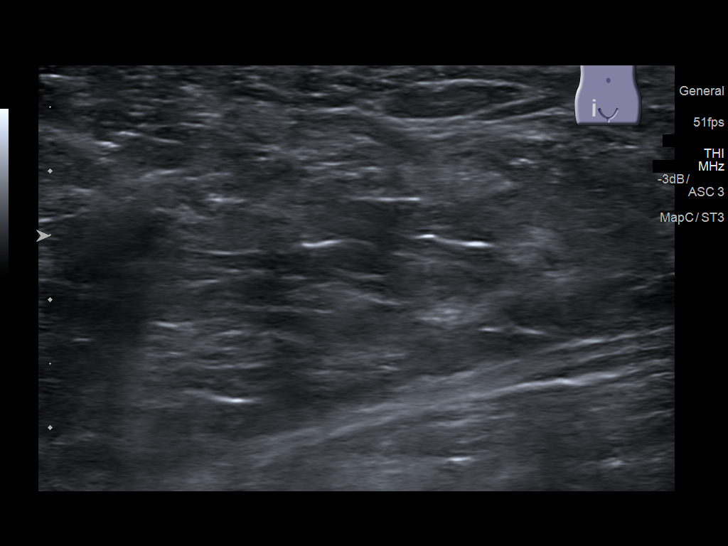

[14 of 25 positions shown; findings below may reference images not displayed]

FINDINGS: Right testicle

Not identified within the scrotal sac or RIGHT inguinal region.

Left testicle

Measurements: 3.2 x 2.1 x 2.4 cm with increased vascularity. No mass
or microlithiasis visualized.

Right epididymis:  Not visualized

Left epididymis:  Enlarged and hypervascular.

Hydrocele:  Small LEFT hydrocele noted.

Varicocele:  Mild LEFT varicocele noted

Pulsed Doppler interrogation of the LEFT testicle demonstrates
normal low resistance arterial and venous waveforms.
IMPRESSION: 1. LEFT epididymo-orchitis with small LEFT hydrocele. No abscess or
testicular torsion.
2. RIGHT testicle not identified within the scrotal sac or RIGHT
inguinal region. Correlate with history.

## 2019-05-24 IMAGING — US US SCROTUM W/ DOPPLER COMPLETE
1 series · 13 of 25 positions shown · non-contrast
Comparison: Two days ago

CLINICAL DATA: Left-sided pelvic pain

EXAM:
SCROTAL ULTRASOUND
DOPPLER ULTRASOUND OF THE TESTICLES
TECHNIQUE: Complete ultrasound examination of the testicles, epididymis, and
other scrotal structures was performed. Color and spectral Doppler
ultrasound were also utilized to evaluate blood flow to the
testicles.

[Series 1: us scrotum w/ doppler complete · 0.06mm/px · 13 of 48 slices shown]
[im 1/48]
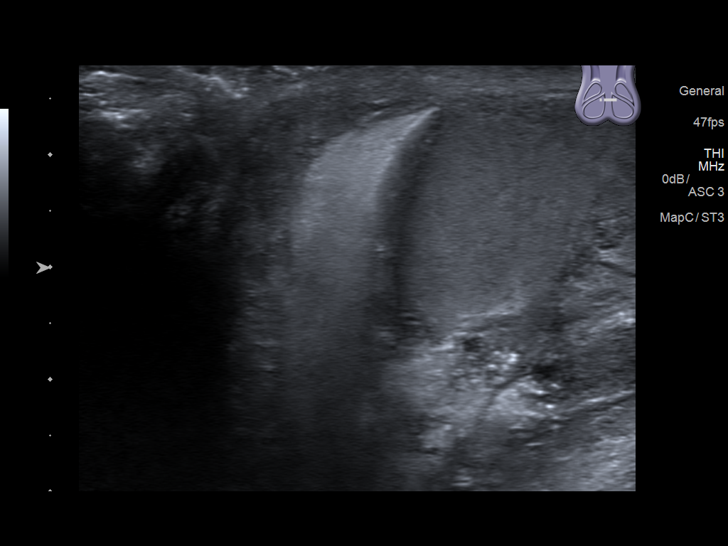
[im 4/48]
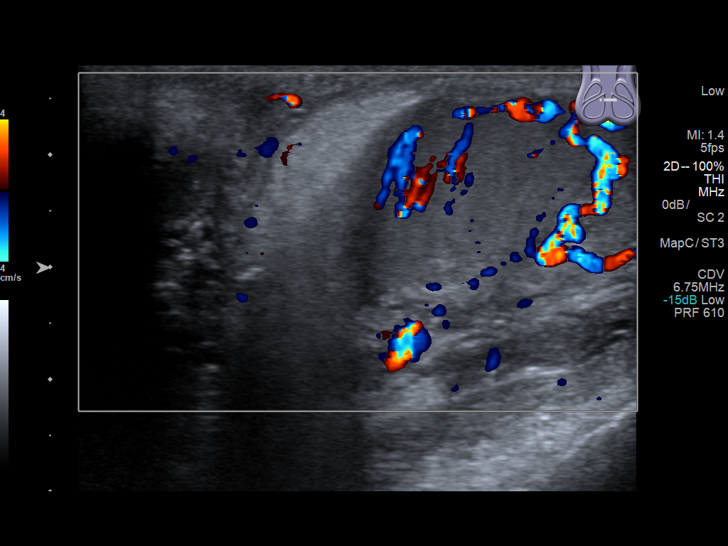
[im 8/48]
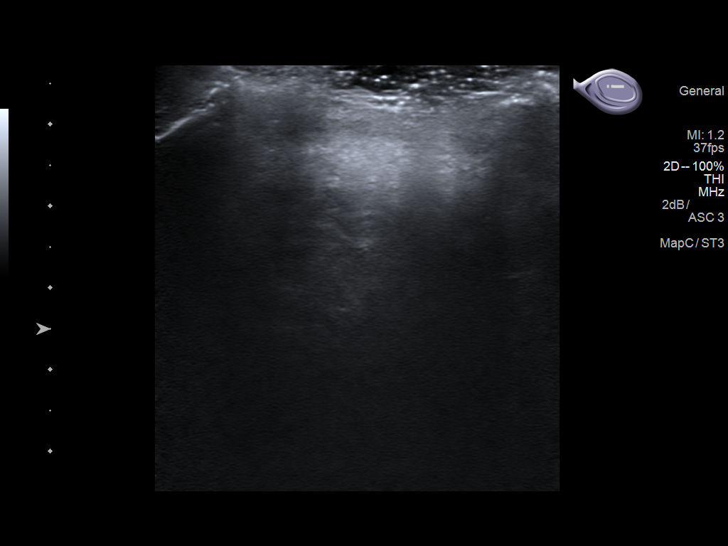
[im 12/48]
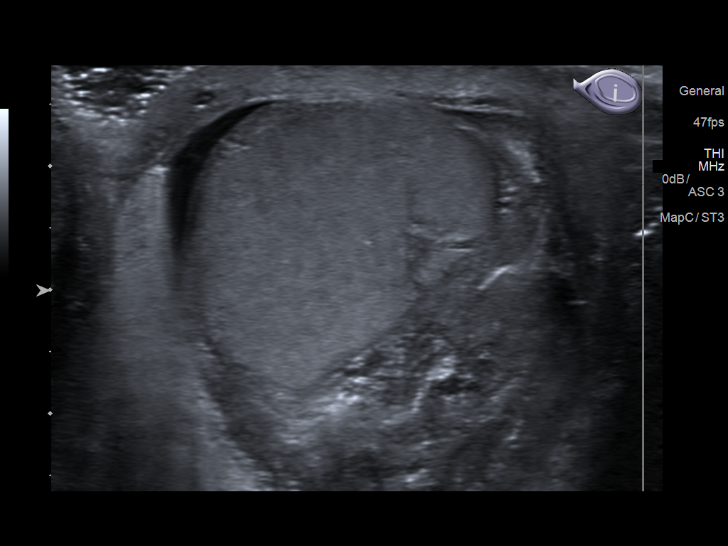
[im 16/48]
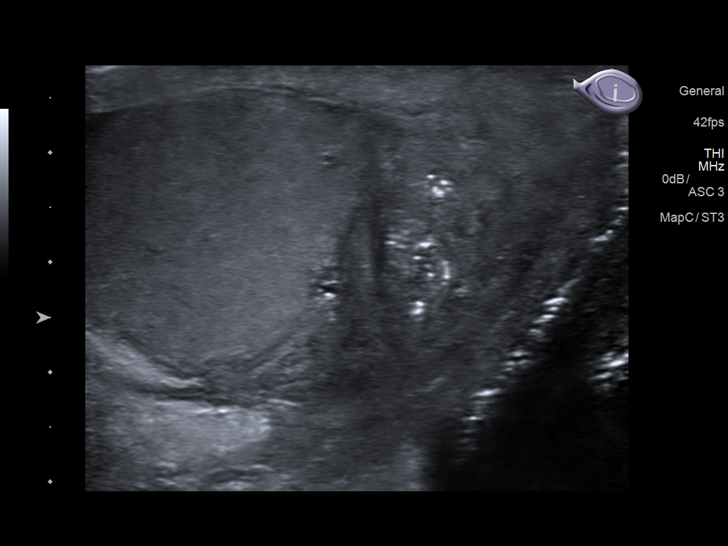
[im 20/48]
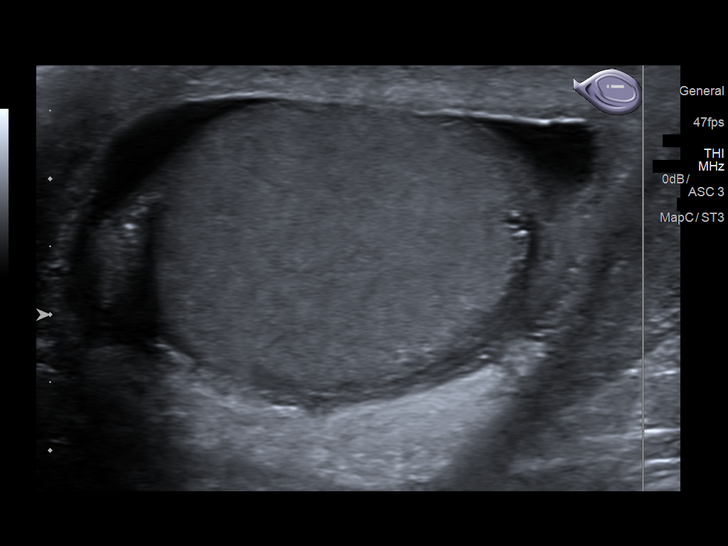
[im 24/48]
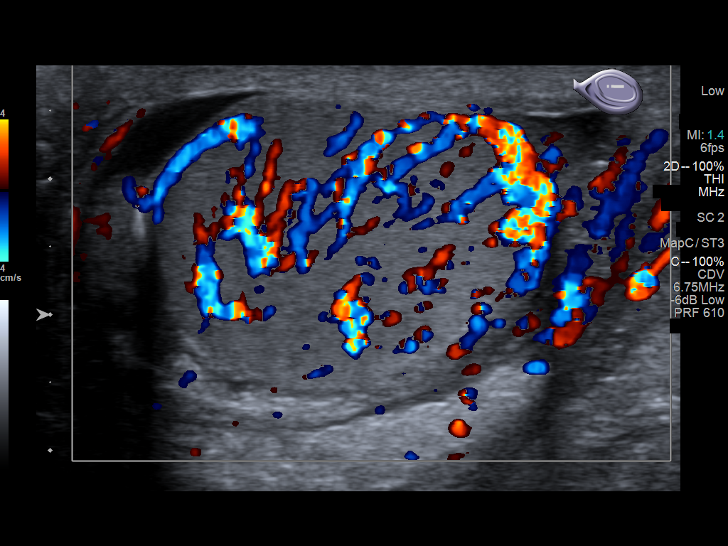
[im 28/48]
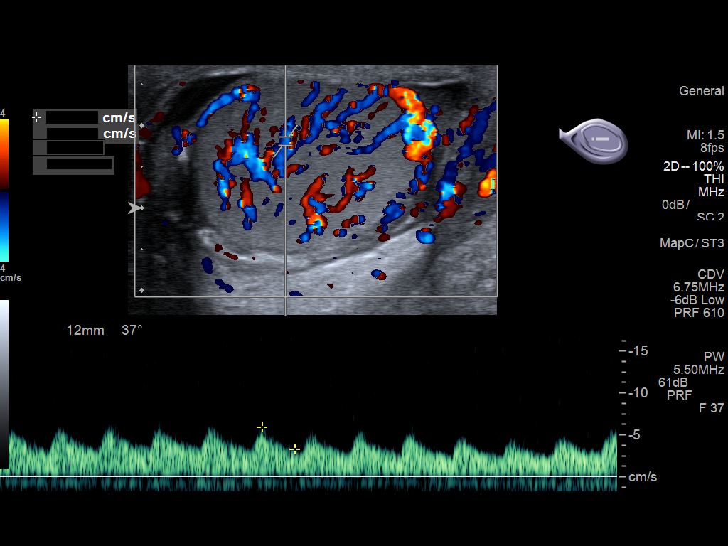
[im 32/48]
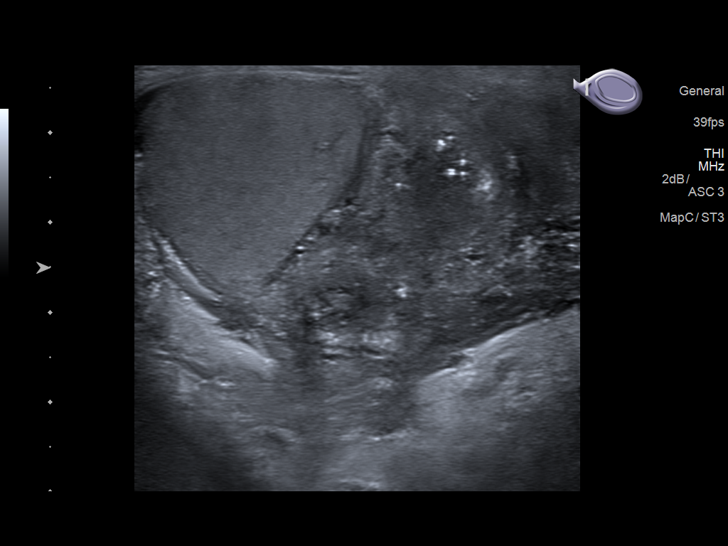
[im 36/48]
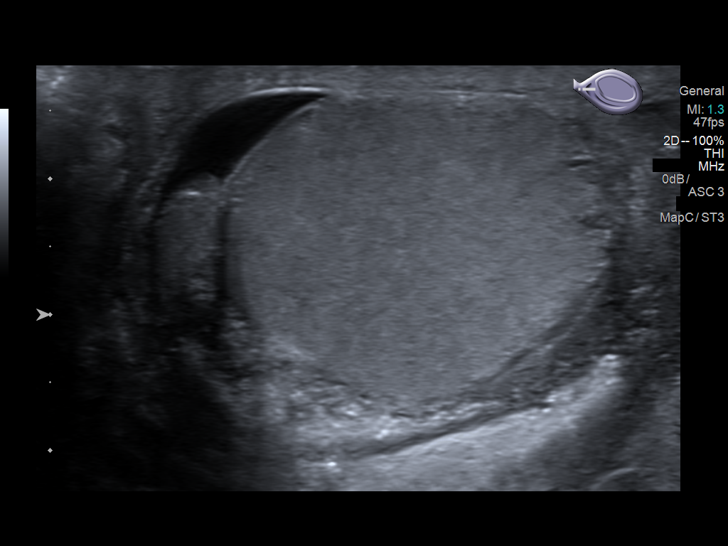
[im 40/48]
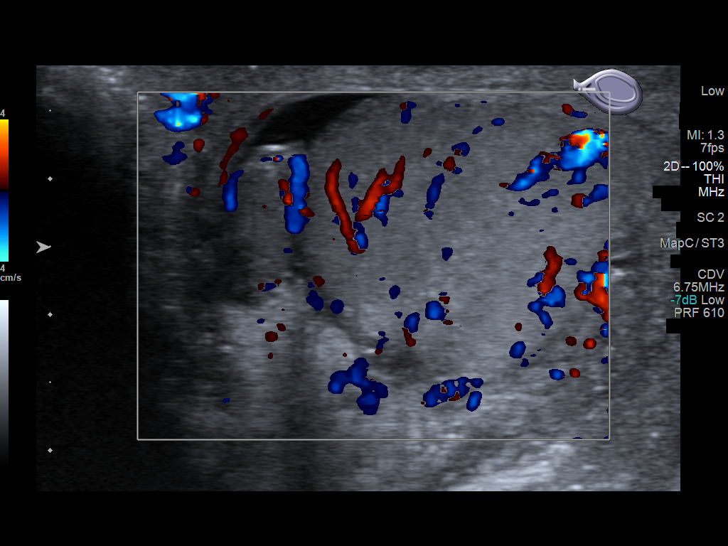
[im 44/48]
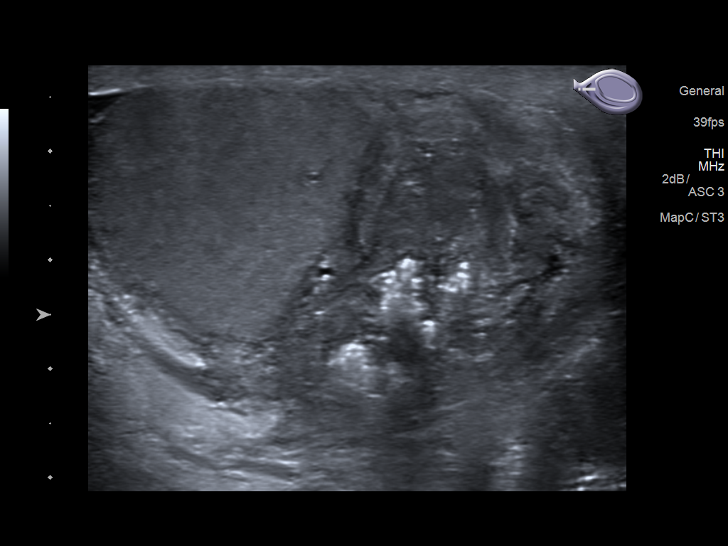
[im 48/48]
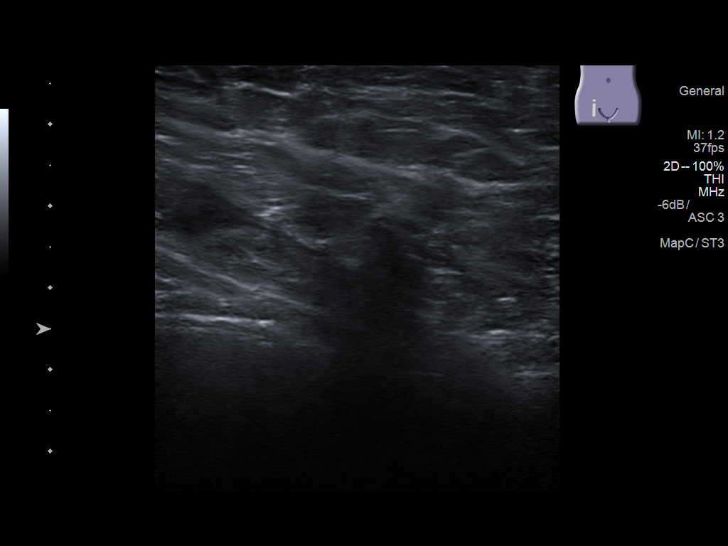

[13 of 25 positions shown; findings below may reference images not displayed]

FINDINGS: Right testicle

Surgically absent, reportedly due to trauma in [REDACTED]

Left testicle

Measurements: 32 x 24 x 25 mm. Hypervascularity. No collection or
heterogeneity.

Right epididymis:  Surgically absent

Left epididymis: Masslike thickening with hypervascularity that has
progressed from prior. There are multiple echogenic foci with
possible mild shadowing, increased from prior.

Hydrocele:  Negative for  pyocele

Varicocele:  Not assessed in this setting

Pulsed Doppler interrogation of the left testicle demonstrates
normal low resistance arterial and venous waveforms.

These results were called by telephone at the time of interpretation
on 02/22/2018 at [DATE] to Dr. CHEKHNA IMO CASAS COMISSIONER.
IMPRESSION: 1. Left epididymo-orchitis with worsening swelling from 2 days ago.
There are multiple echogenic foci within the thickened epididymis,
emphysematous epididymitis is a consideration in this
immunocompromised diabetic. Noncontrast pelvis CT could further
evaluate.
2. No abscess or pyocele.
3. Remote right orchiectomy.

## 2019-05-26 IMAGING — US US SCROTUM W/ DOPPLER COMPLETE
1 series · 14 of 25 positions shown · non-contrast
Comparison: 02/22/2018, 02/20/2018, pelvic CT 02/22/2018

CLINICAL DATA: Follow-up epididymal orchitis

EXAM:
SCROTAL ULTRASOUND
DOPPLER ULTRASOUND OF THE TESTICLES
TECHNIQUE: Complete ultrasound examination of the testicles, epididymis, and
other scrotal structures was performed. Color and spectral Doppler
ultrasound were also utilized to evaluate blood flow to the
testicles.

[Series 1: us scrotum w/ doppler complete · 0.07mm/px · 14 of 36 slices shown]
[im 1/36]
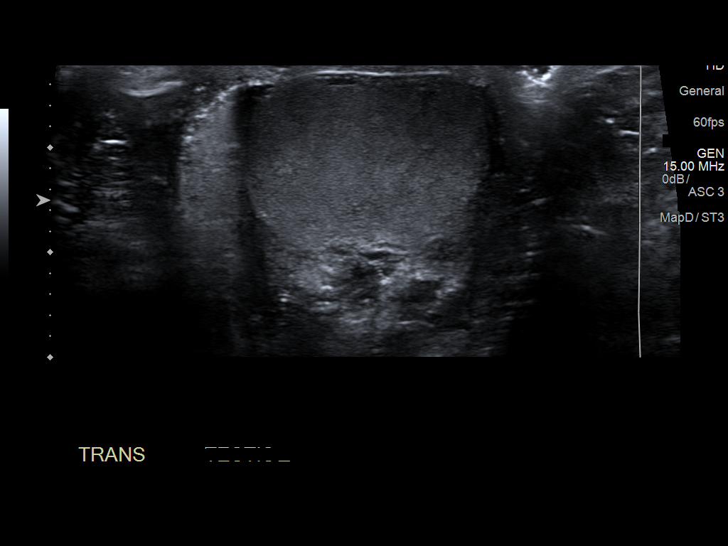
[im 3/36]
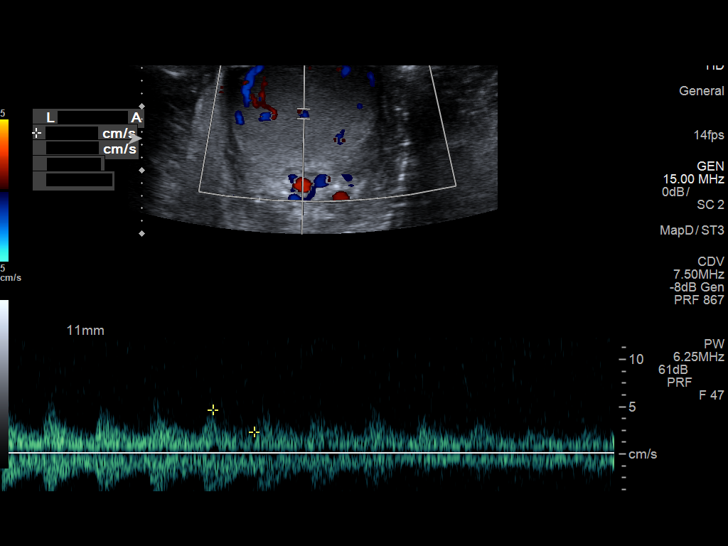
[im 6/36]
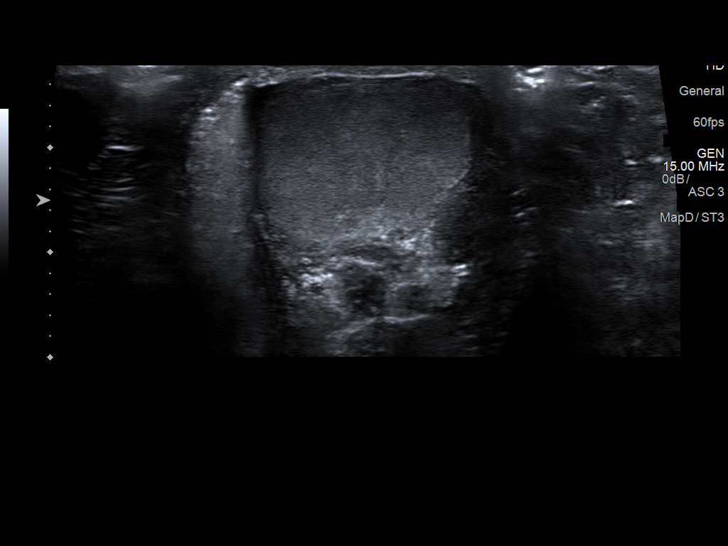
[im 9/36]
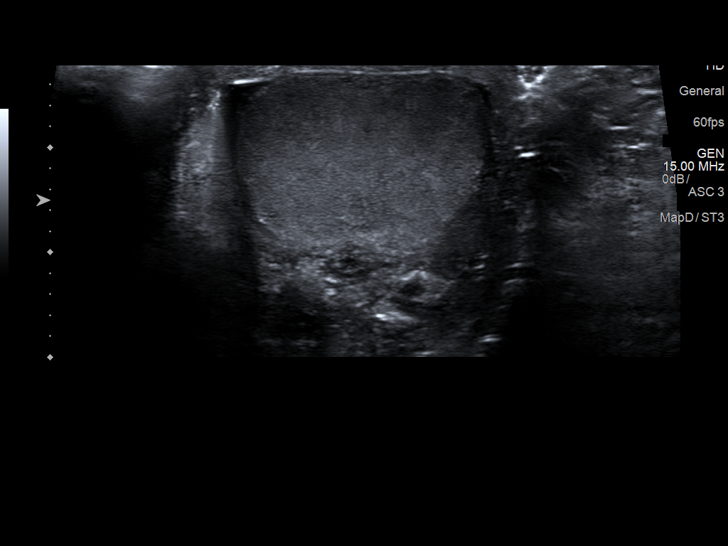
[im 12/36]
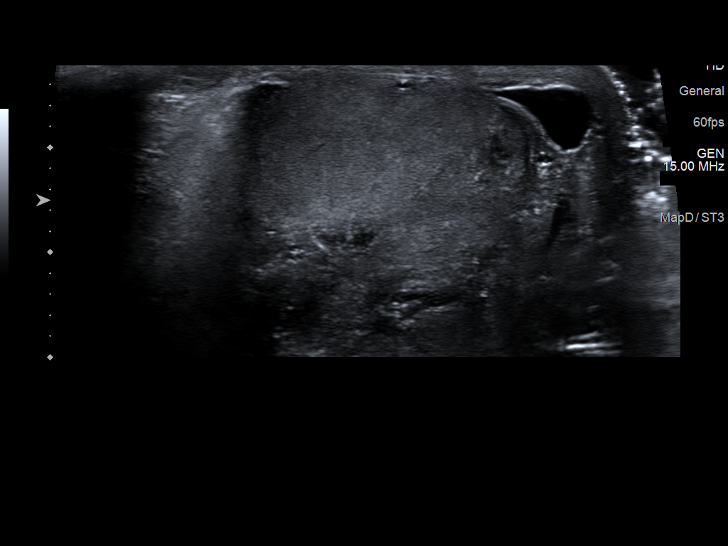
[im 14/36]
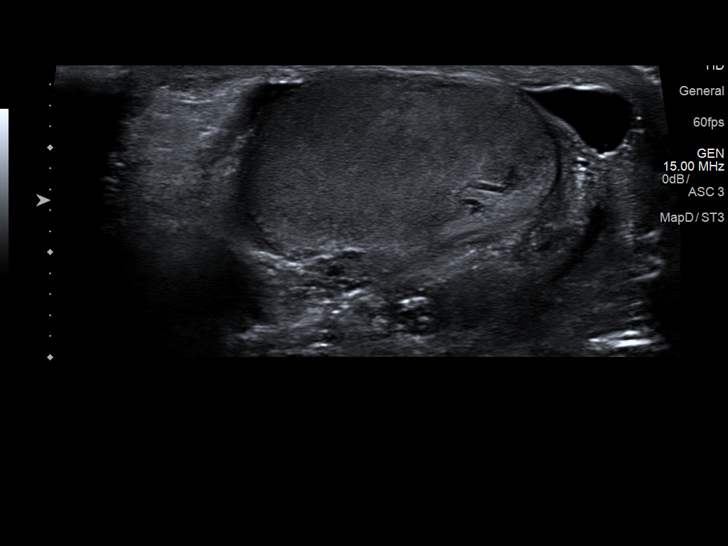
[im 17/36]
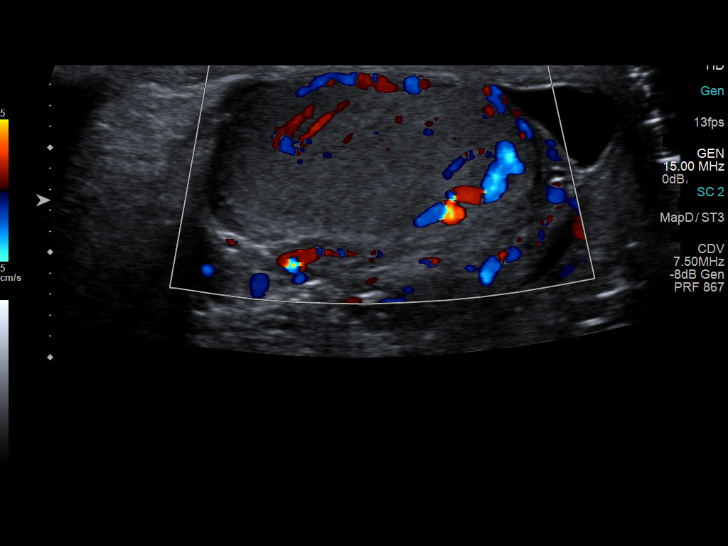
[im 19/36]
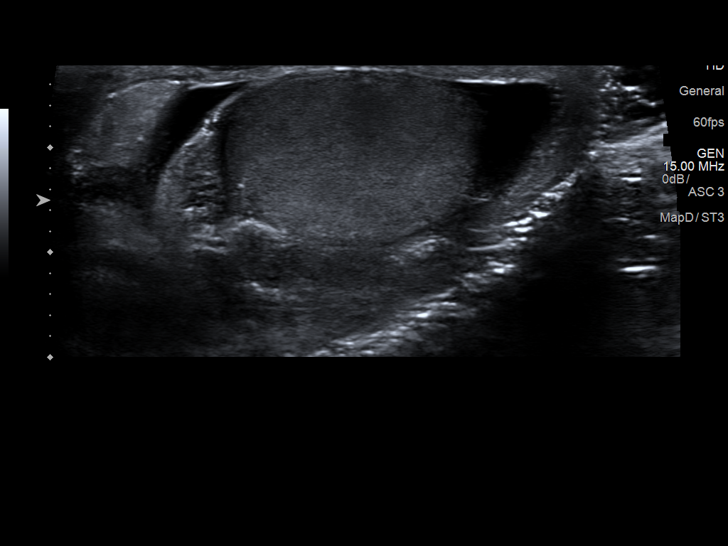
[im 22/36]
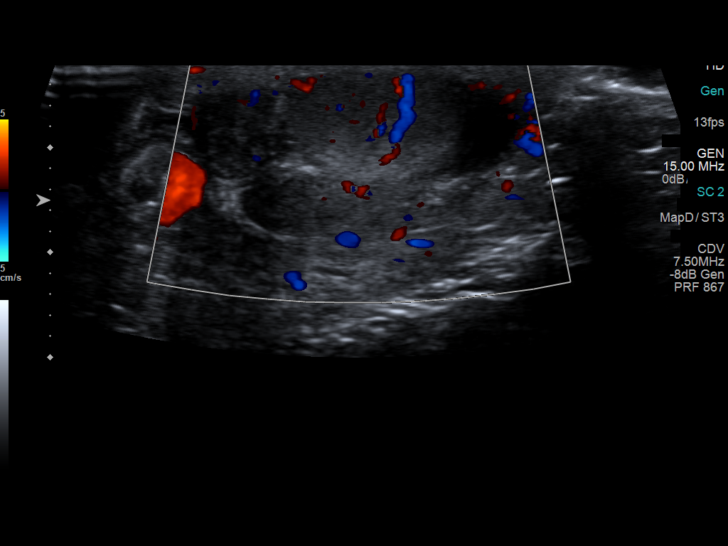
[im 24/36]
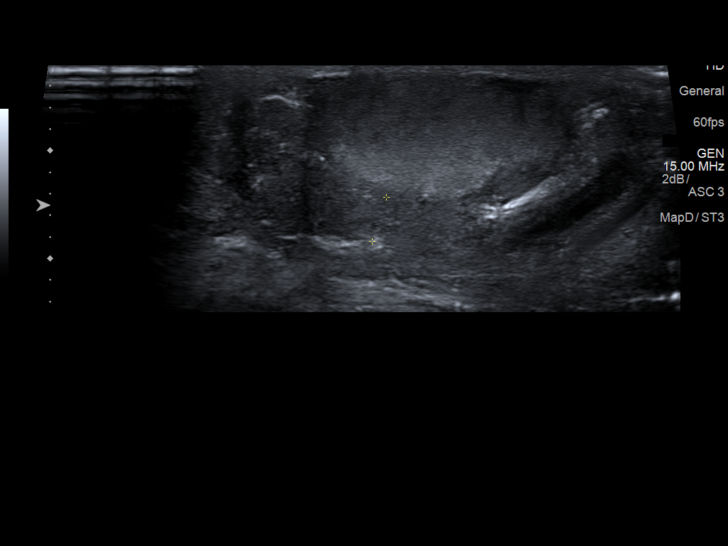
[im 27/36]
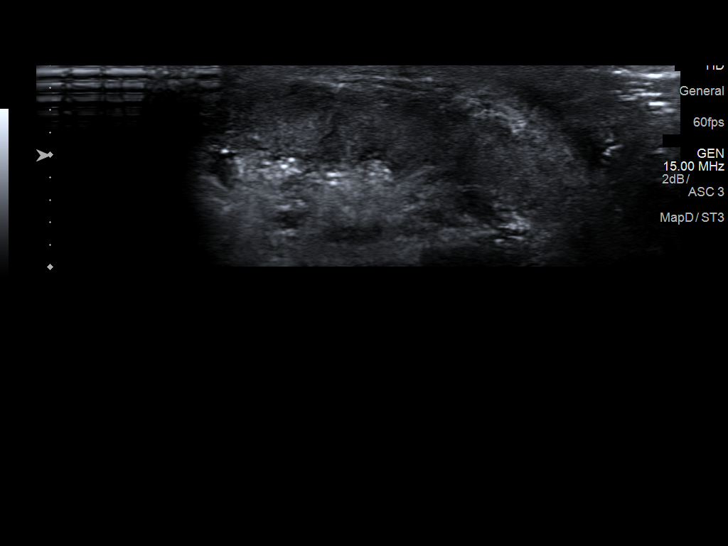
[im 30/36]
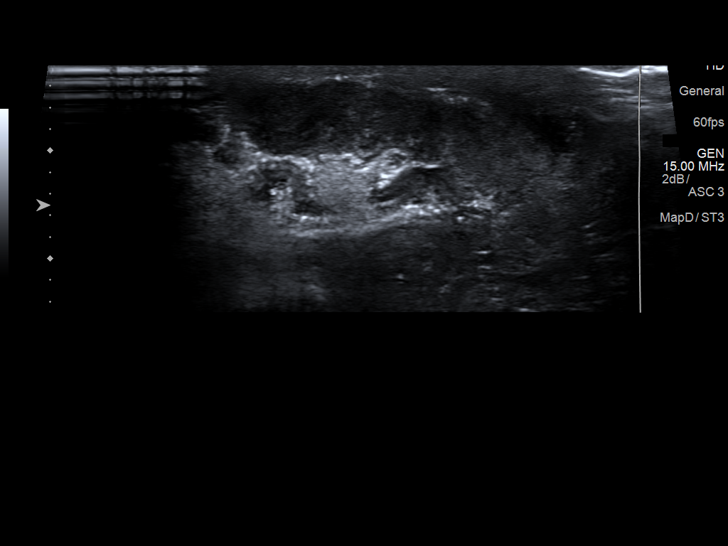
[im 33/36]
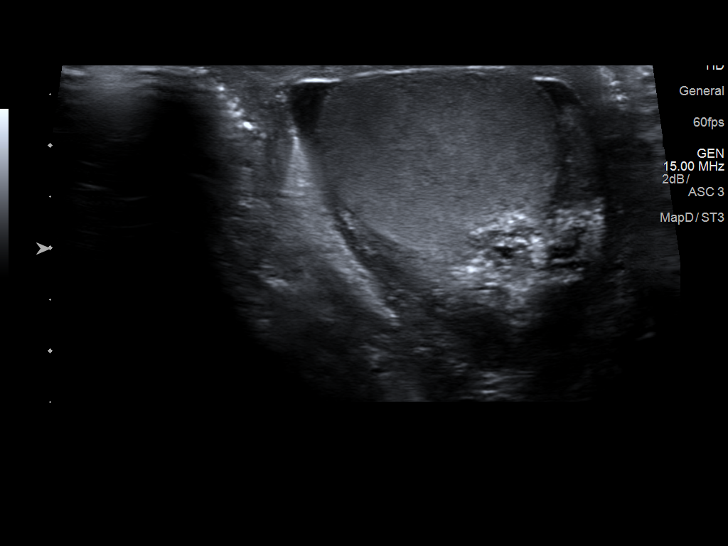
[im 36/36]
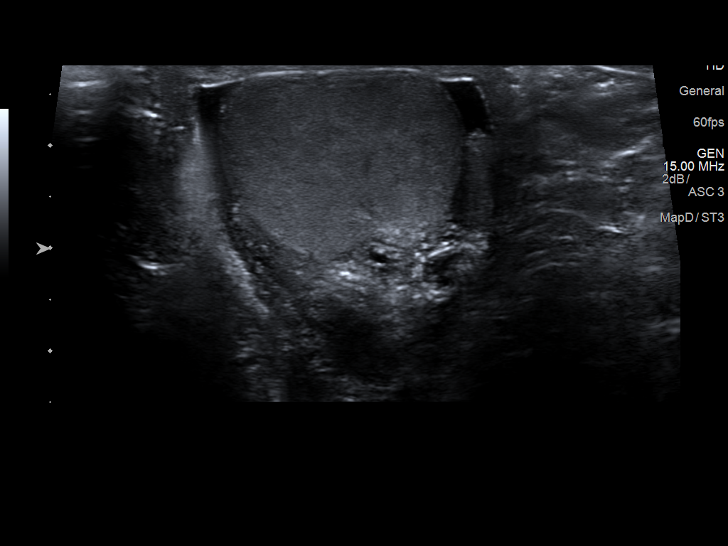

[14 of 25 positions shown; findings below may reference images not displayed]

FINDINGS: Right testicle

Surgically absent

Left testicle

Measurements: 2.8 x 1.5 x 2.4 cm. No mass or microlithiasis
visualized.

Surgically absent

Left epididymis: Normal in size and appearance. Today it measures
6.7 x 4.3 x 6.4 mm, compared with 2.3 x 7.1 mm previously.

Hydrocele:  Small left hydrocele

Varicocele:  None visualized.

Pulsed Doppler interrogation of both testes demonstrates normal low
resistance arterial and venous waveforms bilaterally.
IMPRESSION: 1. Decreased edema and hyperemia of the left epididymis and testis,
consistent with resolving epididymal orchitis. Small left hydrocele
2. Surgically absent right testis

## 2019-05-27 IMAGING — DX DG CHEST 1V PORT
1 series · 1 of 1 positions shown · non-contrast
Comparison: Chest radiograph and chest CT April 11, 2018

CLINICAL DATA: Shortness of breath

EXAM:
PORTABLE CHEST 1 VIEW

[chest ap]
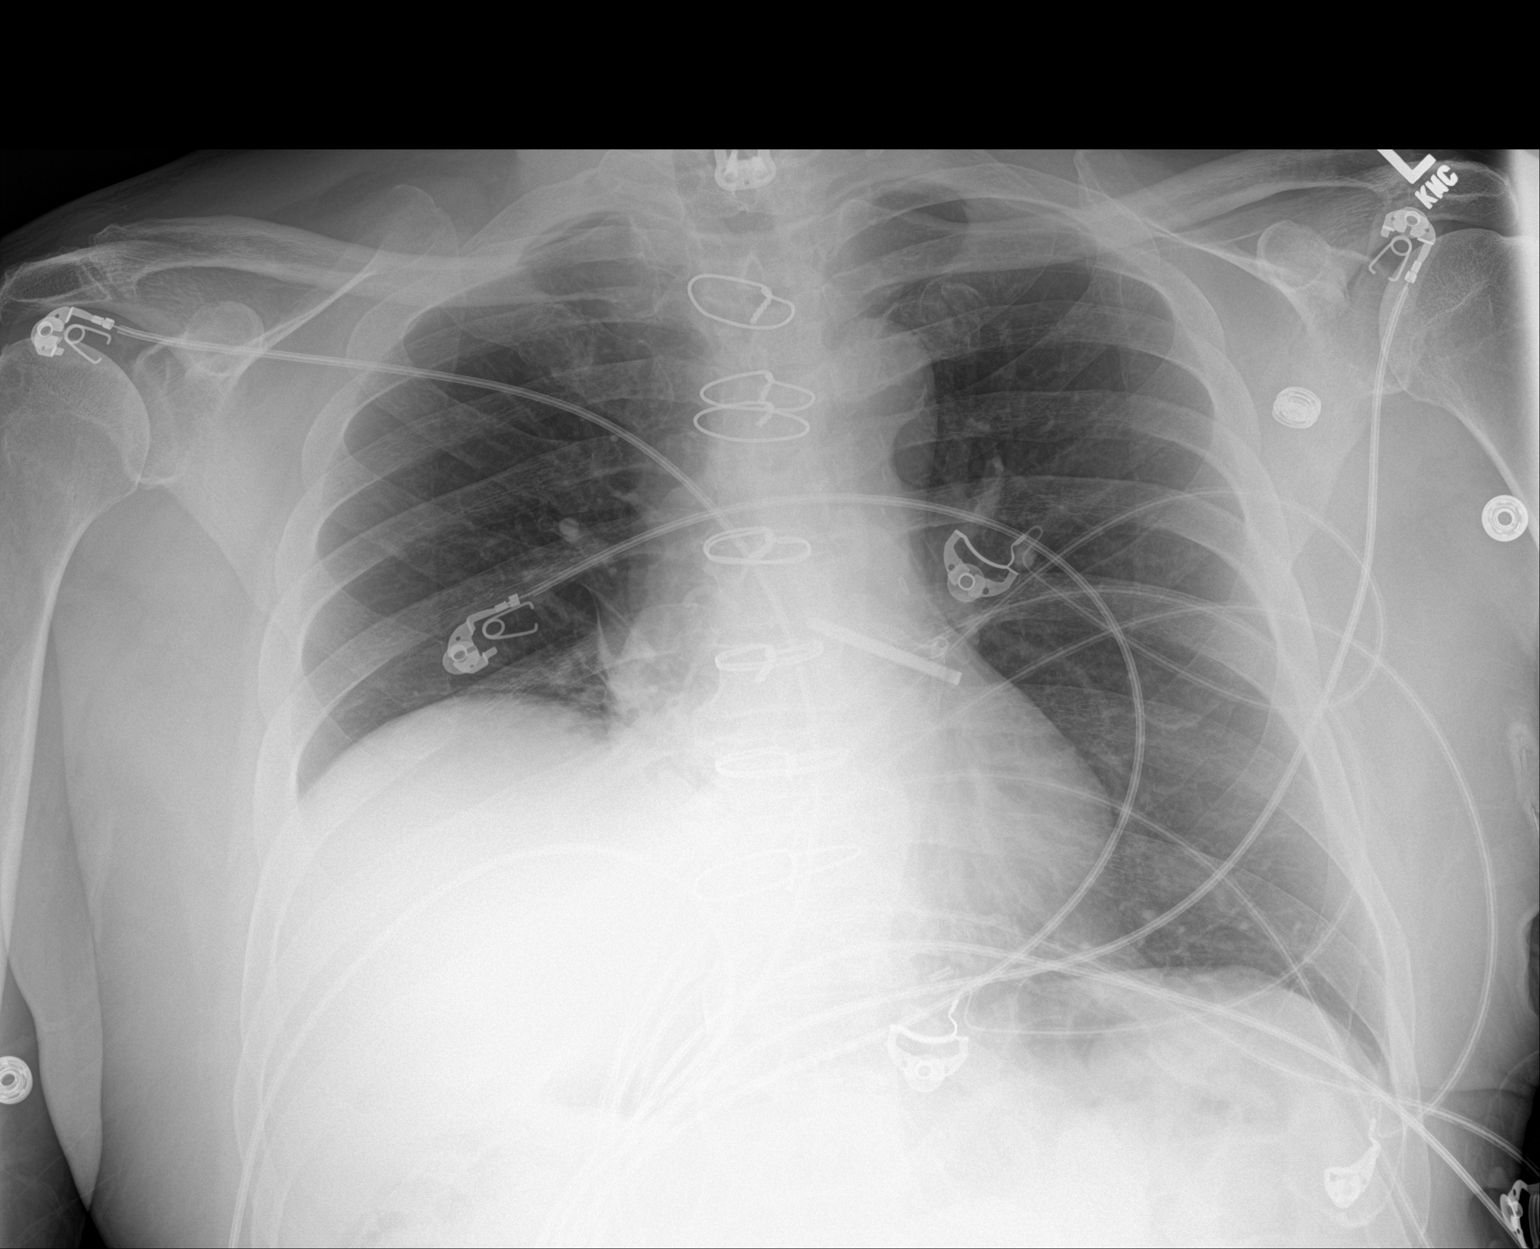

[1 of 1 positions shown; findings below may reference images not displayed]

FINDINGS: There is persistent consolidation in the right base region. There is
persistent elevation of the right hemidiaphragm. Lungs elsewhere
clear. Heart size and pulmonary vascularity are normal. No
adenopathy. Patient is status post median sternotomy. There is a
left atrial appendage clamp. No adenopathy. There is postoperative
change in the lower cervical region.
IMPRESSION: Persistent consolidation right base. Persistent elevation right
hemidiaphragm. No new opacity. Stable cardiac silhouette.

## 2019-06-11 ENCOUNTER — Emergency Department (EMERGENCY_DEPARTMENT_HOSPITAL)
Admission: EM | Admit: 2019-06-11 | Discharge: 2019-06-15 | Disposition: A | Discharge status: Other Acute Inpatient Hospital | Payer: Medicare Other | Source: Home / Self Care | Attending: Emergency Medicine | Admitting: Emergency Medicine

## 2019-06-11 ENCOUNTER — Other Ambulatory Visit: Payer: Self-pay

## 2019-06-11 ENCOUNTER — Encounter (HOSPITAL_COMMUNITY): Payer: Self-pay | Admitting: Emergency Medicine

## 2019-06-11 DIAGNOSIS — Z794 Long term (current) use of insulin: Secondary | ICD-10-CM | POA: Insufficient documentation

## 2019-06-11 DIAGNOSIS — Z20828 Contact with and (suspected) exposure to other viral communicable diseases: Secondary | ICD-10-CM | POA: Insufficient documentation

## 2019-06-11 DIAGNOSIS — R45851 Suicidal ideations: Secondary | ICD-10-CM | POA: Insufficient documentation

## 2019-06-11 DIAGNOSIS — E1140 Type 2 diabetes mellitus with diabetic neuropathy, unspecified: Secondary | ICD-10-CM | POA: Insufficient documentation

## 2019-06-11 DIAGNOSIS — Z79899 Other long term (current) drug therapy: Secondary | ICD-10-CM | POA: Insufficient documentation

## 2019-06-11 DIAGNOSIS — E1122 Type 2 diabetes mellitus with diabetic chronic kidney disease: Secondary | ICD-10-CM | POA: Insufficient documentation

## 2019-06-11 DIAGNOSIS — F333 Major depressive disorder, recurrent, severe with psychotic symptoms: Secondary | ICD-10-CM | POA: Diagnosis present

## 2019-06-11 DIAGNOSIS — F331 Major depressive disorder, recurrent, moderate: Secondary | ICD-10-CM

## 2019-06-11 DIAGNOSIS — I480 Paroxysmal atrial fibrillation: Secondary | ICD-10-CM | POA: Insufficient documentation

## 2019-06-11 DIAGNOSIS — N183 Chronic kidney disease, stage 3 (moderate): Secondary | ICD-10-CM | POA: Insufficient documentation

## 2019-06-11 DIAGNOSIS — I25810 Atherosclerosis of coronary artery bypass graft(s) without angina pectoris: Secondary | ICD-10-CM | POA: Insufficient documentation

## 2019-06-11 DIAGNOSIS — I509 Heart failure, unspecified: Secondary | ICD-10-CM | POA: Insufficient documentation

## 2019-06-11 DIAGNOSIS — R440 Auditory hallucinations: Secondary | ICD-10-CM | POA: Diagnosis not present

## 2019-06-11 DIAGNOSIS — F332 Major depressive disorder, recurrent severe without psychotic features: Secondary | ICD-10-CM | POA: Diagnosis not present

## 2019-06-11 DIAGNOSIS — Z21 Asymptomatic human immunodeficiency virus [HIV] infection status: Secondary | ICD-10-CM | POA: Insufficient documentation

## 2019-06-11 DIAGNOSIS — I130 Hypertensive heart and chronic kidney disease with heart failure and stage 1 through stage 4 chronic kidney disease, or unspecified chronic kidney disease: Secondary | ICD-10-CM | POA: Insufficient documentation

## 2019-06-11 LAB — COMPREHENSIVE METABOLIC PANEL
ALT: 14 U/L (ref 0–44)
AST: 17 U/L (ref 15–41)
Albumin: 3.9 g/dL (ref 3.5–5.0)
Alkaline Phosphatase: 65 U/L (ref 38–126)
Anion gap: 10 (ref 5–15)
BUN: 25 mg/dL — ABNORMAL HIGH (ref 8–23)
CO2: 21 mmol/L — ABNORMAL LOW (ref 22–32)
Calcium: 9.3 mg/dL (ref 8.9–10.3)
Chloride: 107 mmol/L (ref 98–111)
Creatinine, Ser: 1.86 mg/dL — ABNORMAL HIGH (ref 0.61–1.24)
GFR calc Af Amer: 42 mL/min — ABNORMAL LOW (ref >60)
GFR calc non Af Amer: 36 mL/min — ABNORMAL LOW (ref >60)
Glucose, Bld: 297 mg/dL — ABNORMAL HIGH (ref 70–99)
Potassium: 4.1 mmol/L (ref 3.5–5.1)
Sodium: 138 mmol/L (ref 135–145)
Total Bilirubin: 0.4 mg/dL (ref 0.3–1.2)
Total Protein: 8 g/dL (ref 6.5–8.1)

## 2019-06-11 LAB — SALICYLATE LEVEL: Salicylate Lvl: <7 mg/dL (ref 2.8–30.0)

## 2019-06-11 LAB — CBC
HCT: 31.1 % — ABNORMAL LOW (ref 39.0–52.0)
Hemoglobin: 9.8 g/dL — ABNORMAL LOW (ref 13.0–17.0)
MCH: 25.9 pg — ABNORMAL LOW (ref 26.0–34.0)
MCHC: 31.5 g/dL (ref 30.0–36.0)
MCV: 82.1 fL (ref 80.0–100.0)
Platelets: 171 10*3/uL (ref 150–400)
RBC: 3.79 MIL/uL — ABNORMAL LOW (ref 4.22–5.81)
RDW: 14.1 % (ref 11.5–15.5)
WBC: 5.4 10*3/uL (ref 4.0–10.5)
nRBC: 0 % (ref 0.0–0.2)

## 2019-06-11 LAB — ETHANOL: Alcohol, Ethyl (B): 17 mg/dL — ABNORMAL HIGH (ref <10)

## 2019-06-11 LAB — CBG MONITORING, ED: Glucose-Capillary: 272 mg/dL — ABNORMAL HIGH (ref 70–99)

## 2019-06-11 LAB — ACETAMINOPHEN LEVEL: Acetaminophen (Tylenol), Serum: <10 ug/mL — ABNORMAL LOW (ref 10–30)

## 2019-06-11 NOTE — ED Notes (Signed)
Pt A&O x 4, resting at present, no distress noted, calm & cooperative, monitoring for safety. 

## 2019-06-11 NOTE — ED Triage Notes (Signed)
[facility] [place] with attempt to OD on crack/cocaine. Reports having chest pains since last year when had triple by-pass. EKG showed PVCs per EMS.  Pt CBG 303

## 2019-06-12 LAB — RAPID URINE DRUG SCREEN, HOSP PERFORMED
Amphetamines: NOT DETECTED
Barbiturates: NOT DETECTED
Benzodiazepines: NOT DETECTED
Cocaine: POSITIVE — AB
Opiates: NOT DETECTED
Tetrahydrocannabinol: NOT DETECTED

## 2019-06-12 IMAGING — DX DG CHEST 2V
2 series · 2 of 2 positions shown · non-contrast
Comparison: 05/03/2018

CLINICAL DATA: Chest pain

EXAM:
CHEST - 2 VIEW

[chest pa]
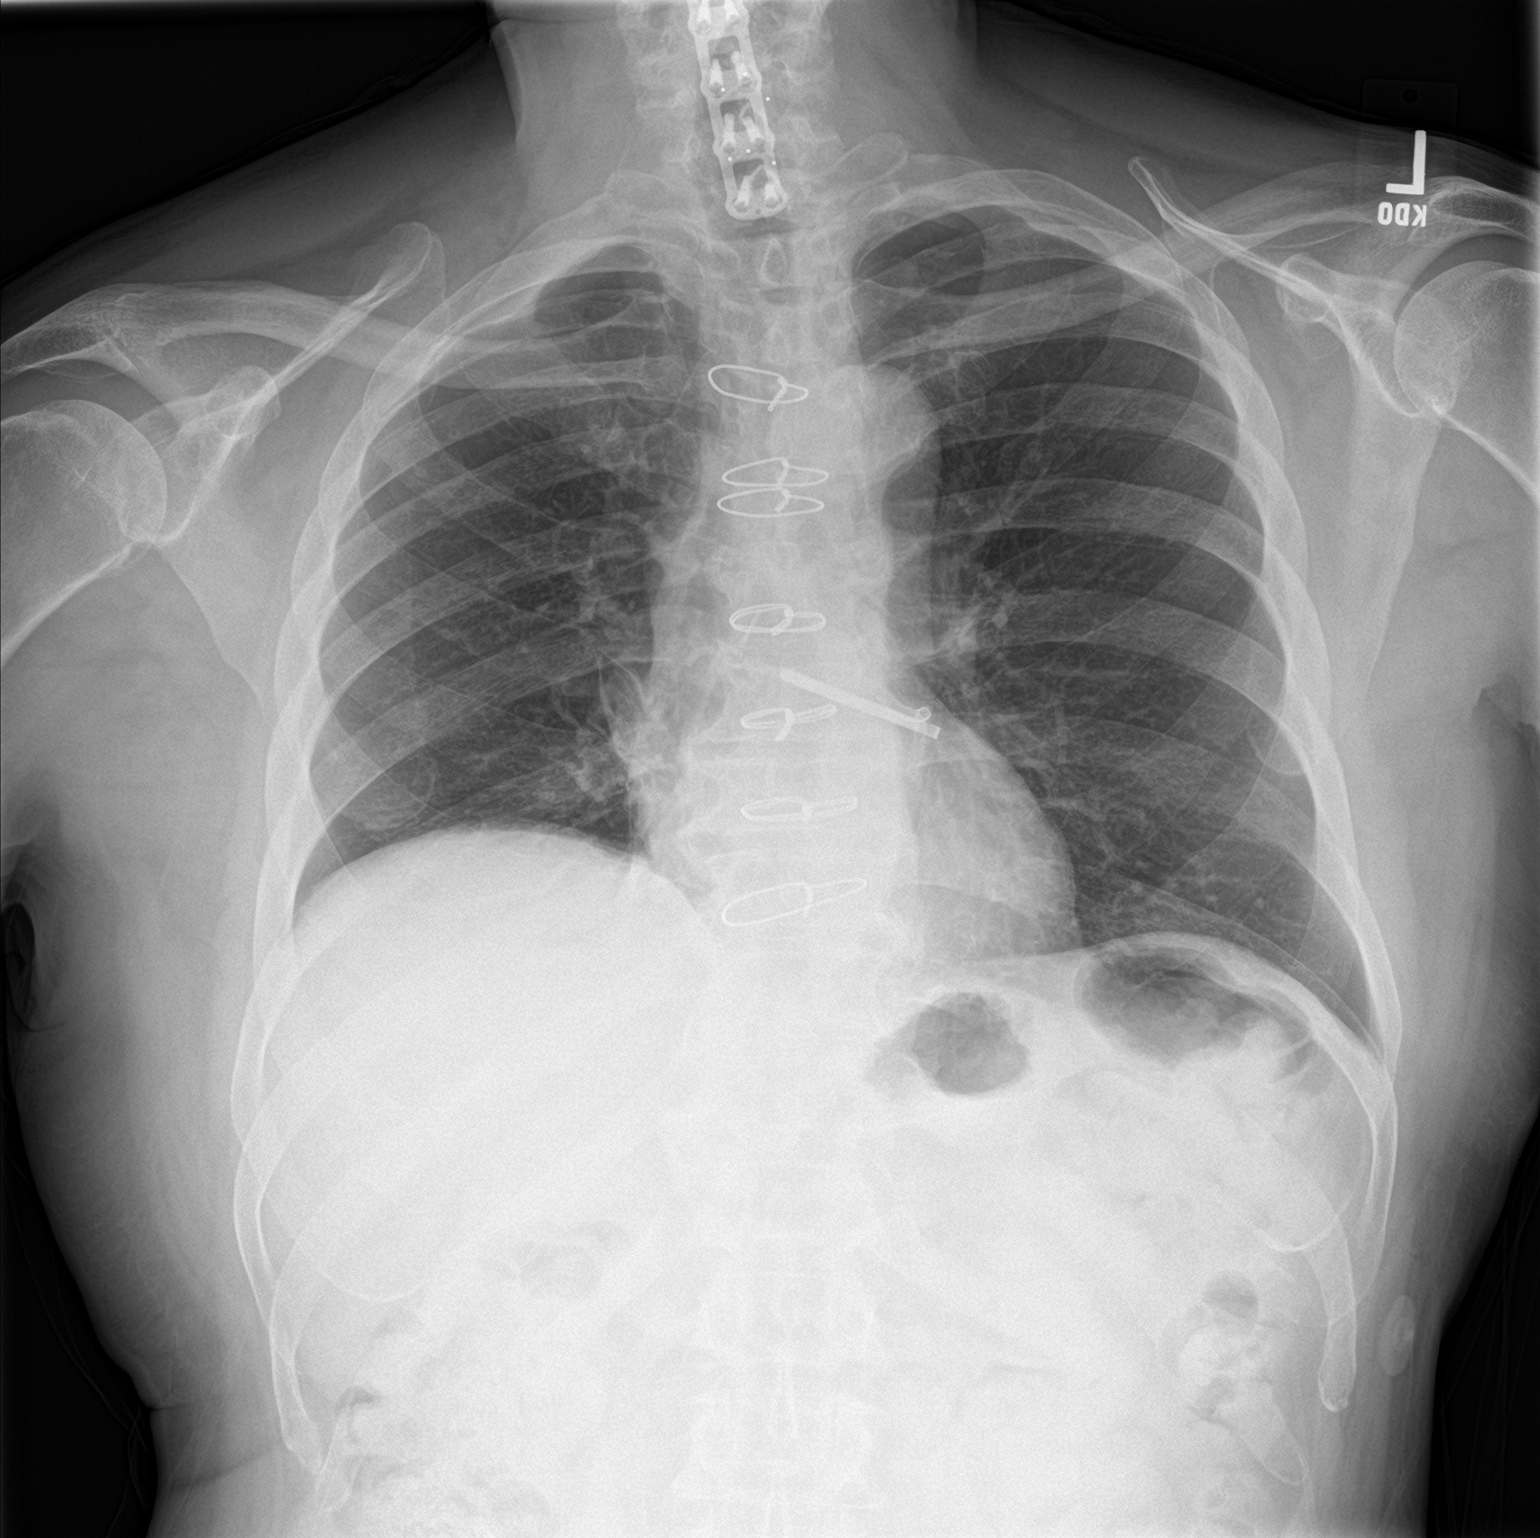

[chest lat]
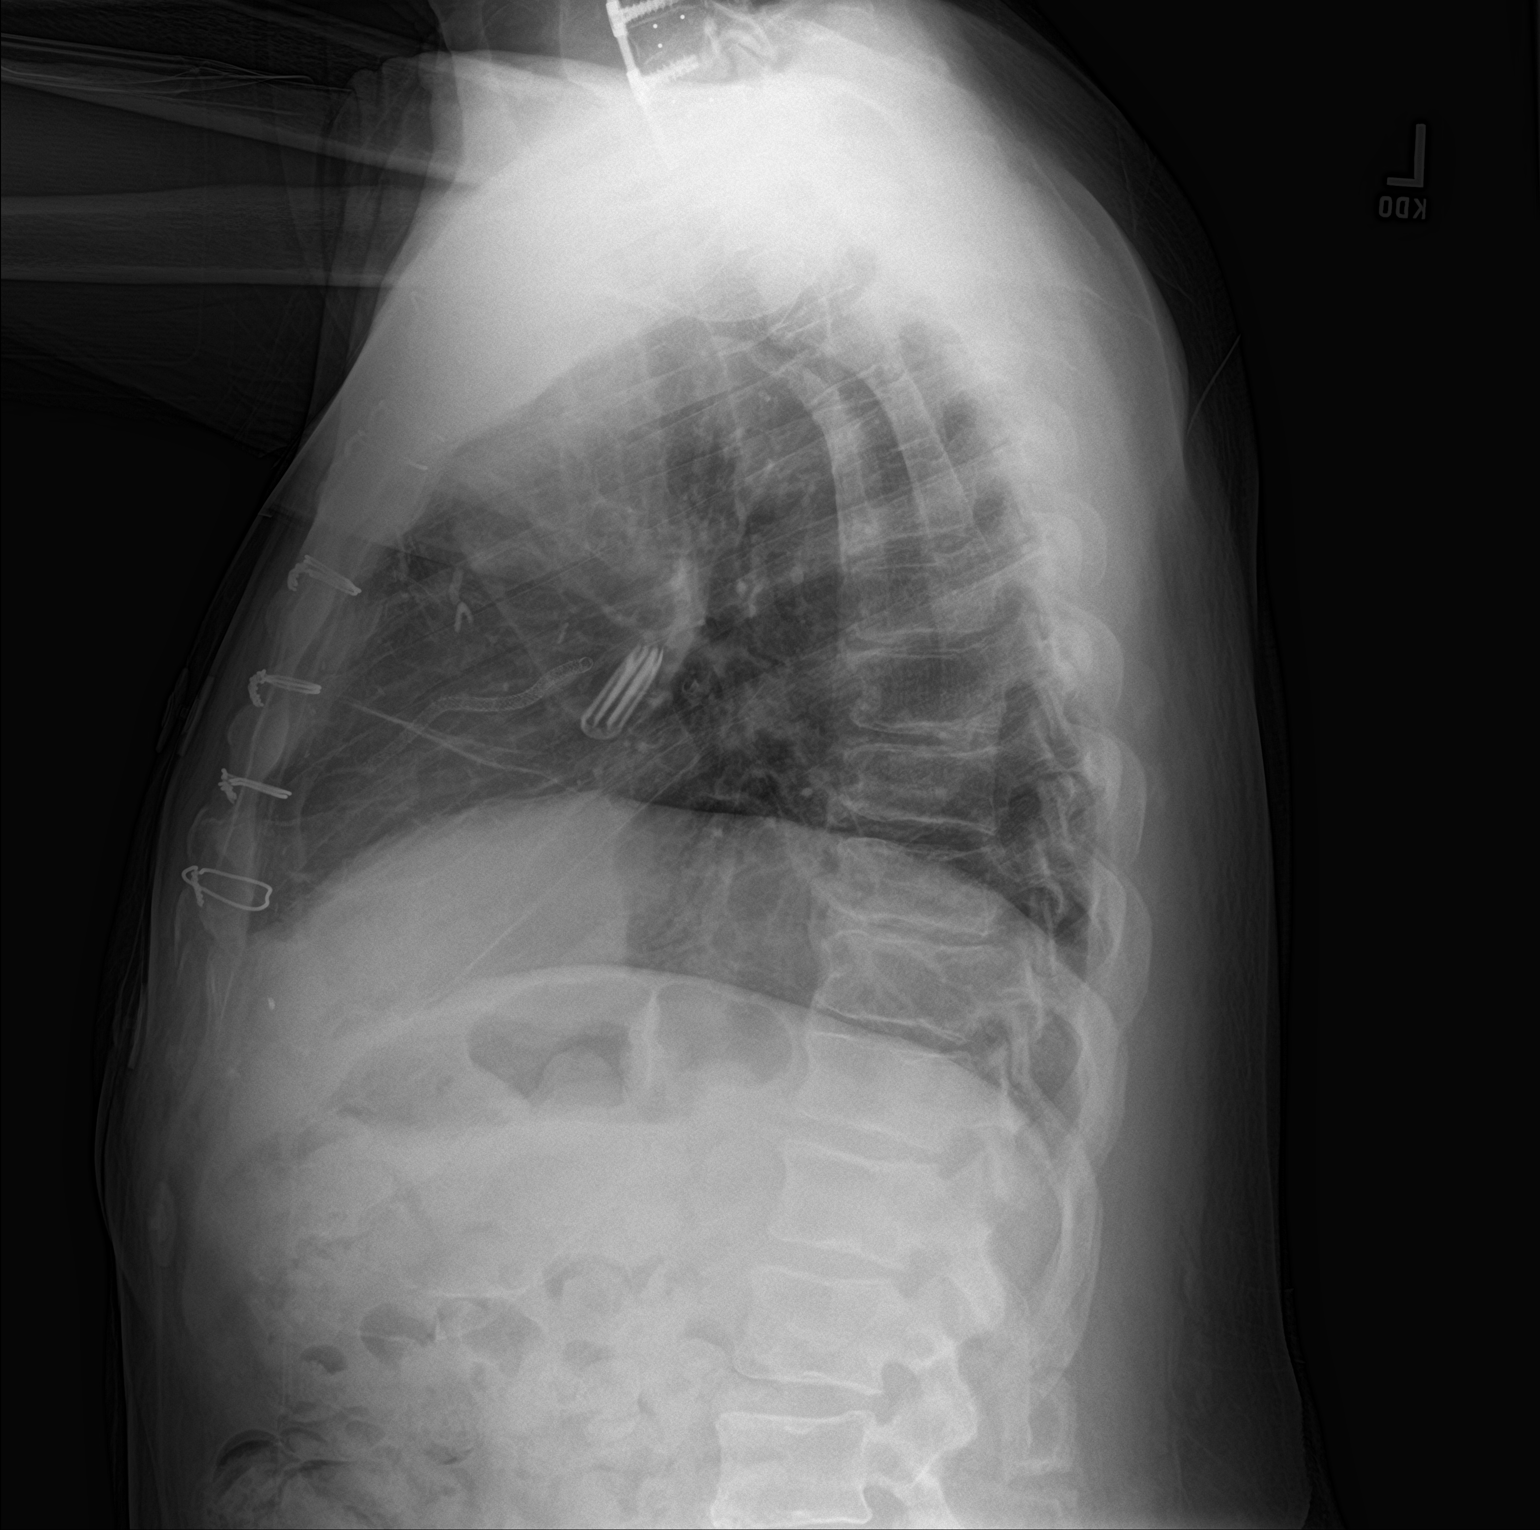

[2 of 2 positions shown; findings below may reference images not displayed]

FINDINGS: Lungs are clear.  No pleural effusion or pneumothorax.

The heart is normal in size. Postsurgical changes related to prior
CABG.

Degenerative changes of the thoracic spine. Cervical spine fixation
hardware. Median sternotomy.
IMPRESSION: No evidence of acute cardiopulmonary disease.

## 2019-06-12 IMAGING — DX DG CHEST 2V
2 series · 2 of 2 positions shown · non-contrast
Comparison: 04/17/2018, CT chest 04/11/2018

CLINICAL DATA: Chest pain

EXAM:
CHEST - 2 VIEW

[chest pa]
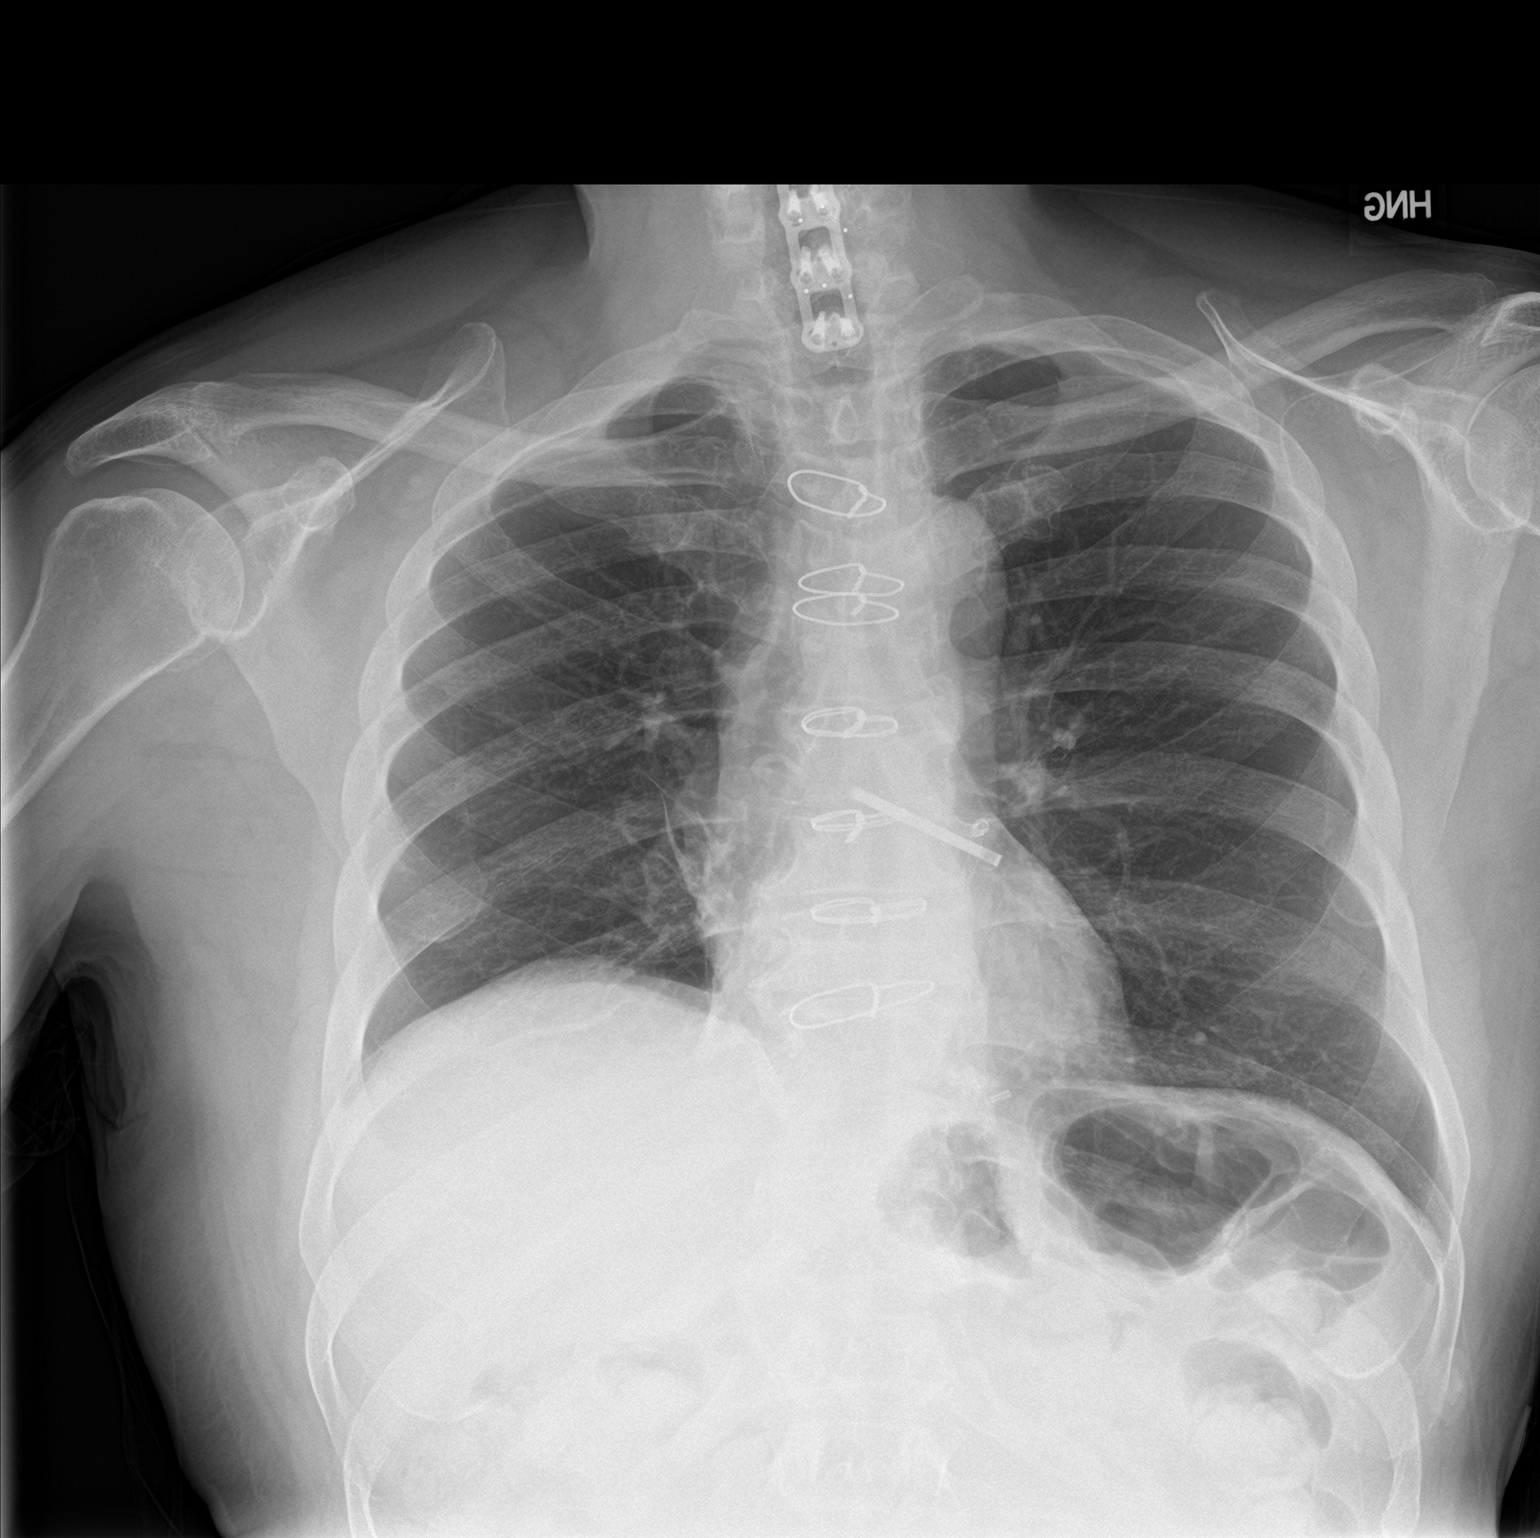

[chest lat]
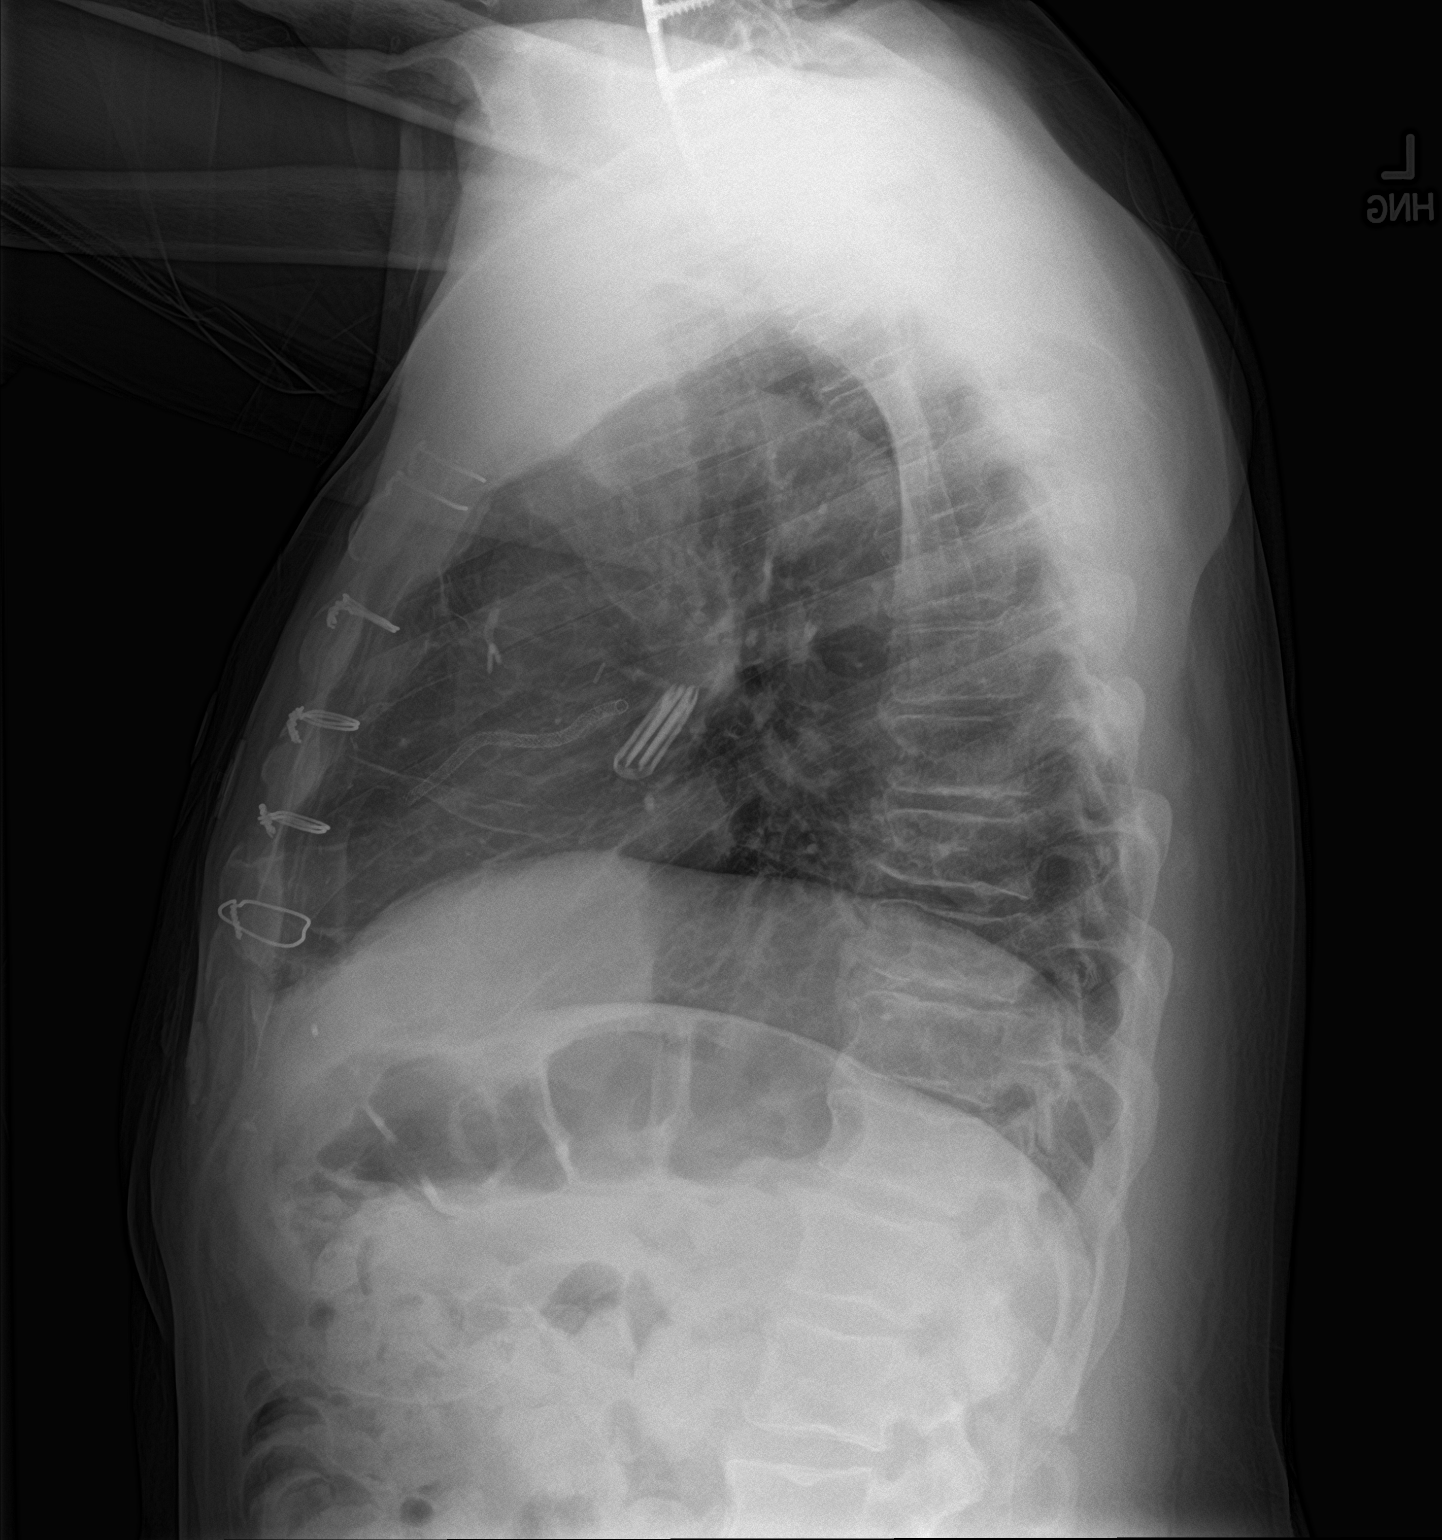

[2 of 2 positions shown; findings below may reference images not displayed]

FINDINGS: Postsurgical changes of the cervical spine. Post sternotomy. Mild
elevation of right diaphragm.

No acute airspace disease or effusion. Residual opacity in the
medial right base. Stable cardiomediastinal silhouette. No
pneumothorax. Degenerative changes of the spine.
IMPRESSION: No acute interval change since 04/17/2018. Persistent mild opacity
in the medial right base, may reflect slowly resolving pneumonia.

## 2019-06-12 MED ORDER — IBUPROFEN 200 MG PO TABS
400.0000 mg | ORAL_TABLET | Freq: Once | ORAL | Status: AC
  Administered 2019-06-12: 400 mg via ORAL
  Filled 2019-06-12: qty 2

## 2019-06-12 NOTE — ED Notes (Signed)
Moved over from TCU to room 35 for ongoing monitoring. He reports feeling "rough" States he has ongoing chest pain. Writer offered him hot tea and some Ibuprofen for his complaint. He does use cocaine and used some prior to admission in an attempt to kill self. He is able to contract for safety at this time. He is calm and cooperative. Will monitor for safety and needs.

## 2019-06-12 NOTE — ED Notes (Signed)
Patient resting quietly.

## 2019-06-12 NOTE — ED Notes (Addendum)
Patient resting quietly.

## 2019-06-12 NOTE — ED Provider Notes (Signed)
[facility] DEPT Provider Note   CSN: [id number] Arrival date & time: 06/11/19  1552     History   Chief Complaint Chief Complaint  Patient presents with  . Suicidal  . Chest Pain    HPI Michael [name] is a 68 y.o. male.     HPI Patient presents to the emergency department with suicidal ideation.  The patient states he is been having chest pain for over a year since his triple bypass.  Patient states that the suicidal thoughts are fairly constant but it got worse over the last few weeks.  Patient states he has no definitive plan but but is concerned with the increasing thoughts.  The patient denies shortness of breath, headache,blurred vision, neck pain, fever, cough, weakness, numbness, dizziness, anorexia, edema, abdominal pain, nausea, vomiting, diarrhea, rash, back pain, dysuria, hematemesis, bloody stool, near syncope, or syncope. Past Medical History:  Diagnosis Date  . A-fib ([place])   . Asthma    No PFTs, history of childhood asthma  . CAD (coronary artery disease)    a. 06/2013 STEMI/PCI ([facility]): LAD w/ thrombus (treated with BMS), mid 75%, D2 75%; LCX OM2 75%; RCA small, PDA 95%, PLV 95%;  b. 10/2013 Cath/PCI: ISR w/in LAD (Promus DES x 2), borderline OM2 lesion;  c. 01/2014 MV: Intermediate risk, medium-sized distal ant wall infarct w/ very small amt of peri-infarct ischemia. EF 60%.  . Cellulitis 04/2014   left facial  . Chondromalacia of medial femoral condyle    Left knee MRI 04/28/12: Chondromalacia of the medial femoral condyle with slight peripheral degeneration of the meniscocapsular junction of the medial meniscus; followed by sports medicine  . Collagen vascular disease ([place])   . Crack cocaine use    for 20+ years, has been enrolled in detox programs in the past  . Depression    with history of hospitalization for suicidal ideation  . Diabetes mellitus 2002   Diagnosed in 2002, started insulin in 2012  . Gout   . Gout 04/28/2012  .  Headache(784.0)    CT head 08/2011: Periventricular and subcortical white matter hypodensities are most in keeping with chronic microangiopathic change  . HIV infection [facility]) Nov 2012   Followed by [name]  . Hyperlipidemia   . Hypertension   . Pulmonary embolism [facility])     Patient Active Problem List   Diagnosis Date Noted  . Respiratory distress 04/12/2019  . Pneumonia due to severe acute respiratory syndrome coronavirus 2 (SARS-CoV-2) 04/11/2019  . COVID-19 virus infection 03/19/2019  . Chest pain 10/03/2018  . Malnutrition of moderate degree 09/21/2018  . MDD (major depressive disorder), recurrent episode, moderate ([place])   . Type II diabetes mellitus with renal manifestations ([place]) 05/04/2018  . GERD (gastroesophageal reflux disease) 05/04/2018  . S/P CABG (coronary artery bypass graft)   . Left testicular pain   . Depression 02/20/2018  . Epididymo-orchitis, acute 02/20/2018  . Essential hypertension 02/20/2018  . Precordial chest pain 02/20/2018  . AKI (acute kidney injury) ([place]) 02/14/2018  . HCAP (healthcare-associated pneumonia) 02/14/2018  . History of pulmonary embolism 07/04/2017  . Acute hyponatremia 05/11/2017  . Hyperglycemia due to type 2 diabetes mellitus ([place]) 05/11/2017  . CHF (congestive heart failure) ([place]) 04/01/2017  . Hepatitis C 12/30/2016  . Chronic ischemic heart disease 11/12/2016  . Paroxysmal A-fib ([place]) 11/12/2016  . Back pain 04/18/2016  . S/P carotid endarterectomy 11/15/2015  . MDD (major depressive disorder), recurrent severe, without psychosis ([place]) 09/09/2015  . Cocaine-induced  mood disorder ([place]) 08/14/2015  . Cocaine abuse with cocaine-induced mood disorder ([place]) 08/14/2015  . Gout 07/10/2015  . Acute renal failure superimposed on stage 3 chronic kidney disease ([place]) 03/06/2015  . Normocytic anemia 03/06/2015  . Chronic kidney disease, stage III (moderate) ([place]) 03/06/2015  . Hypoglycemia   . Encounter for general adult medical  examination with abnormal findings 02/09/2015  . Cocaine use disorder, severe, dependence ([place]) 12/13/2014  . Substance or medication-induced depressive disorder with onset during withdrawal ([place]) 12/13/2014  . Severe recurrent major depressive disorder with psychotic features ([place]) 12/12/2014  . Cervicalgia 06/28/2014  . Lumbar radiculopathy, chronic 06/28/2014  . Asthma, chronic 02/03/2014  . 3-vessel CAD 06/24/2013  . ED (erectile dysfunction) of organic origin 07/07/2012  . Hypertension goal BP (blood pressure) < 140/80 04/29/2012  . Chondromalacia of left knee 03/19/2012  . Hyperlipidemia with target LDL less than 100 02/12/2012  . Fibromyalgia 02/12/2012  . HIV (human immunodeficiency virus infection) ([place]) 08/27/2011  . Uncontrolled type 2 diabetes with neuropathy ([place]) 10/17/2000    Past Surgical History:  Procedure Laterality Date  . BACK SURGERY     1988  . BOWEL RESECTION    . CARDIAC SURGERY    . CERVICAL SPINE SURGERY     " rods in my neck "  . CORONARY ARTERY BYPASS GRAFT    . CORONARY STENT PLACEMENT    . NM MYOCAR PERF WALL MOTION  12/27/2011   normal  . SPINE SURGERY          Home Medications    Prior to Admission medications   Medication Sig Start Date End Date Taking? Authorizing Provider  acetaminophen (TYLENOL) 500 MG tablet Take 1,000 mg by mouth every 6 (six) hours as needed for moderate pain or headache.   Yes [provider]  albuterol (PROAIR HFA) 108 (90 Base) MCG/ACT inhaler INHALE TWO PUFFS INTO THE LUNGS EVERY 6 (SIX) HOURS AS NEEDED FOR WHEEZING OR SHORTNESS OF BREATH. Patient taking differently: Inhale 2 puffs into the lungs every 6 (six) hours as needed for wheezing or shortness of breath.  11/24/17  Yes Lindell Spar I, NP  aspirin 81 MG chewable tablet Chew 81 mg by mouth daily.   Yes [provider]  emtricitabine-rilpivir-tenofovir AF (ODEFSEY) 200-25-25 MG TABS tablet Take 1 tablet by mouth daily with breakfast. 03/04/19   Yes Kuppelweiser, Cassie L, RPH-CPP  Fluticasone Propionate, Inhal, (FLOVENT DISKUS) 100 MCG/BLIST AEPB Inhale 1 puff into the lungs 2 (two) times daily.   Yes [provider]  gabapentin (NEURONTIN) 400 MG capsule Take 1 capsule (400 mg total) by mouth 2 (two) times daily. For agitation/neuropathic pain 11/24/17  Yes Lindell Spar I, NP  hydrOXYzine (ATARAX/VISTARIL) 25 MG tablet Take 1 tablet (25 mg total) by mouth 3 (three) times daily as needed for anxiety. 11/24/17  Yes Nwoko, Herbert Pun I, NP  insulin aspart (NOVOLOG) 100 UNIT/ML injection Before each meal 3 times a day , 140-199 - 2 units, 200-250 - 4 units, 251-299 - 6 units,  300-349 - 8 units,  350 or above 10 units. Insulin PEN if approved, provide syringes and needles if needed. Can switch to any other cheaper alternative. Patient taking differently: Inject 2-10 Units into the skin See admin instructions. Before each meal 3 times a day , 140-199 - 2 units, 200-250 - 4 units, 251-299 - 6 units,  300-349 - 8 units,  350 or above 10 units. Insulin PEN if approved, provide syringes and needles if needed. Can  switch to any other cheaper alternative. 04/19/19  Yes Thurnell Lose, MD  insulin glargine (LANTUS) 100 UNIT/ML injection Inject 0.2 mLs (20 Units total) into the skin at bedtime. Dispense insulin pen if approved, if not dispense as needed syringes and needles for 1 month supply. Can switch to Levemir. Diagnosis E 11.65. 04/19/19  Yes Thurnell Lose, MD  metFORMIN (GLUCOPHAGE) 1000 MG tablet Take 1,000 mg by mouth 2 (two) times daily. 03/04/19  Yes [provider]  sertraline (ZOLOFT) 50 MG tablet Take 1 tablet (50 mg total) by mouth daily. 09/22/18  Yes Lady Deutscher, MD  traZODone (DESYREL) 50 MG tablet Take 1 tablet (50 mg total) by mouth at bedtime as needed for sleep. 04/16/19  Yes Thurnell Lose, MD  allopurinol (ZYLOPRIM) 300 MG tablet Take 1 tablet (300 mg total) by mouth daily. For gout. Patient not taking:  Reported on 06/11/2019 11/25/17   Lindell Spar I, NP  benzonatate (TESSALON) 100 MG capsule Take 2 capsules (200 mg total) by mouth 3 (three) times daily as needed for cough. 03/21/19   Ghimire, Henreitta Leber, MD  blood glucose meter kit and supplies KIT Dispense based on patient and insurance preference. Use up to four times daily as directed. (FOR ICD-9 250.00, 250.01). For QAC - HS accuchecks. 04/20/19   Thurnell Lose, MD  famotidine (PEPCID) 20 MG tablet Take 1 tablet (20 mg total) by mouth 2 (two) times daily. Patient not taking: Reported on 06/11/2019 04/19/19   Thurnell Lose, MD  guaiFENesin (MUCINEX) 600 MG 12 hr tablet Take 1 tablet (600 mg total) by mouth 2 (two) times daily. Patient not taking: Reported on 06/11/2019 10/07/18   Lavina Hamman, MD  Insulin Syringe-Needle U-100 25G X 1" 1 ML MISC For 4 times a day insulin SQ, 1 month supply. Diagnosis E11.65 04/16/19   Thurnell Lose, MD  polyethylene glycol (MIRALAX / GLYCOLAX) 17 g packet Take 17 g by mouth 2 (two) times daily. Patient not taking: Reported on 06/11/2019 04/23/19   Drenda Freeze, MD    Family History Family History  Problem Relation Age of Onset  . Diabetes Mother   . Hypertension Mother   . Hyperlipidemia Mother   . Diabetes Father   . Cancer Father   . Hypertension Father   . Diabetes Brother   . Heart disease Brother   . Diabetes Sister   . Colon cancer Neg Hx     Social History Social History   Tobacco Use  . Smoking status: Never Smoker  . Smokeless tobacco: Never Used  Substance Use Topics  . Alcohol use: No    Alcohol/week: 4.0 standard drinks    Types: 2 Cans of beer, 2 Shots of liquor per week  . Drug use: Yes    Types: "Crack" cocaine, Cocaine     Allergies   Patient has no known allergies.   Review of Systems Review of Systems All other systems negative except as documented in the HPI. All pertinent positives and negatives as reviewed in the HPI.  Physical Exam Updated Vital  Signs BP (!) 148/98 (BP Location: Left Arm)   Pulse (!) 103   Temp 98.4 F (36.9 C) (Oral)   Resp 15   SpO2 99%   Physical Exam Vitals signs and nursing note reviewed.  Constitutional:      General: He is not in acute distress.    Appearance: He is well-developed.  HENT:     Head: Normocephalic  and atraumatic.  Eyes:     Pupils: Pupils are equal, round, and reactive to light.  Neck:     Musculoskeletal: Normal range of motion and neck supple.  Cardiovascular:     Rate and Rhythm: Normal rate and regular rhythm.     Heart sounds: Normal heart sounds. No murmur. No friction rub. No gallop.   Pulmonary:     Effort: Pulmonary effort is normal. No respiratory distress.     Breath sounds: Normal breath sounds. No wheezing.  Abdominal:     General: Bowel sounds are normal. There is no distension.     Palpations: Abdomen is soft.     Tenderness: There is no abdominal tenderness.  Skin:    General: Skin is warm and dry.     Capillary Refill: Capillary refill takes less than 2 seconds.     Findings: No erythema or rash.  Neurological:     Mental Status: He is alert and oriented to person, place, and time.     Motor: No abnormal muscle tone.     Coordination: Coordination normal.  Psychiatric:        Behavior: Behavior normal.        Thought Content: Thought content is not paranoid or delusional. Thought content includes suicidal ideation. Thought content does not include homicidal ideation. Thought content does not include homicidal or suicidal plan.      ED Treatments / Results  Labs (all labs ordered are listed, but only abnormal results are displayed) Labs Reviewed  COMPREHENSIVE METABOLIC PANEL - Abnormal; Notable for the following components:      Result Value   CO2 21 (*)    Glucose, Bld 297 (*)    BUN 25 (*)    Creatinine, Ser 1.86 (*)    GFR calc non Af Amer 36 (*)    GFR calc Af Amer 42 (*)    All other components within normal limits  ETHANOL - Abnormal;  Notable for the following components:   Alcohol, Ethyl (B) 17 (*)    All other components within normal limits  ACETAMINOPHEN LEVEL - Abnormal; Notable for the following components:   Acetaminophen (Tylenol), Serum <10 (*)    All other components within normal limits  CBC - Abnormal; Notable for the following components:   RBC 3.79 (*)    Hemoglobin 9.8 (*)    HCT 31.1 (*)    MCH 25.9 (*)    All other components within normal limits  CBG MONITORING, ED - Abnormal; Notable for the following components:   Glucose-Capillary 272 (*)    All other components within normal limits  SALICYLATE LEVEL  RAPID URINE DRUG SCREEN, HOSP PERFORMED    EKG EKG Interpretation  Date/Time:  Friday June 11 2019 16:07:22 EDT Ventricular Rate:  101 PR Interval:    QRS Duration: 87 QT Interval:  350 QTC Calculation: 454 R Axis:   70 Text Interpretation:  Sinus tachycardia Supraventricular bigeminy Baseline wander in lead(s) V1 No significant change since last tracing Confirmed by [name] [id number]) on 06/11/2019 4:13:48 PM   Radiology No results found.  Procedures Procedures (including critical care time)  Medications Ordered in ED Medications - No data to display   Initial Impression / Assessment and Plan / ED Course  I have reviewed the triage vital signs and the nursing notes.  Pertinent labs & imaging results that were available during my care of the patient were reviewed by me and considered in my medical decision making (see chart  for details).        Patient will need TDS assessment for suicidal ideations.  I feel that this chest pain is chronic in nature and is been ongoing for quite a while and do not feel that this is a current medical issue that would preclude him from being medically cleared.  Patient thus far is medically clear.  Final Clinical Impressions(s) / ED Diagnoses   Final diagnoses:  None    ED Discharge Orders    None       Dalia Heading,  PA-C 06/12/19 [place], [place], DO 06/12/19 [phone]

## 2019-06-12 NOTE — BH Assessment (Addendum)
[facility] Assessment Progress Note  Per Starkes NP patient will be observed and monitored.

## 2019-06-12 NOTE — BH Assessment (Addendum)
Assessment Note  Michael [name] is an 68 y.o. male that presents this date with S/I. Patient voices a plan to overdose on cocaine or other street drugs. Patient denies any H/I or AVH. Patient is observed to be very drowsy at the time of assessment and renders limited history. Patient states he is currently homeless after attempting to reconnect with a family member who could provide housing. Patient states "that fell through with" and started feeling suicidal with "no hope left." Patient states he was diagnosed with depression "years ago" and cannot recall time frame or provider who originally diagnosed him. Patient reports multiple attempts at self harm over the last two years and is well known to [facility] with three previous psychiatric hospitalizations since December 2019 at [facility], [place] and [place] for similar presentations. Patient has a hong history of cocaine use beginning at age 44. Patient states that over the past few days that he has used 500 worth of cocaine. Patient has been receiving OP services from [facility] who assisted with medication management for ongoing symptoms of depression although reports he has not seen that provider since earlier this year and has not been on any medications to assist with symptom management. He states that he has been depressed the last week with ongoing symptoms to include: feeling worthless and isolating. Patient states that he is experiencing sleep disturbance and he is only sleeping two hours per night and states that he has not been eating well. Patient states that he is widowed and on disability. Patient states that he has had triple bypass surgery, but he has continued to use cocaine. Patient presents as drowsy although oriented x 4. His mood depressed and his affect flat. His thoughts are organized and his memory intact. His judgment, insight and impulse control are impaired. He is not currently responding to internal stimuli.  His speech is  soft and his eye contact was poor. His psycho-motor activity is unremarkable. Per notes    patient presents to the emergency department with suicidal ideation. The patient states he is been having chest pain for over a year since his triple bypass. Patient states that the suicidal thoughts are fairly constant but it got worse over the last few weeks. Per Starkes NP patient will be observed and monitored.  Diagnosis: F33.2 MDD recurrent without psychotic features, severe, Cocaine use    Past Medical History:  Past Medical History:  Diagnosis Date  . A-fib ([place])   . Asthma    No PFTs, history of childhood asthma  . CAD (coronary artery disease)    a. 06/2013 STEMI/PCI ([facility]): LAD w/ thrombus (treated with BMS), mid 75%, D2 75%; LCX OM2 75%; RCA small, PDA 95%, PLV 95%;  b. 10/2013 Cath/PCI: ISR w/in LAD (Promus DES x 2), borderline OM2 lesion;  c. 01/2014 MV: Intermediate risk, medium-sized distal ant wall infarct w/ very small amt of peri-infarct ischemia. EF 60%.  . Cellulitis 04/2014   left facial  . Chondromalacia of medial femoral condyle    Left knee MRI 04/28/12: Chondromalacia of the medial femoral condyle with slight peripheral degeneration of the meniscocapsular junction of the medial meniscus; followed by sports medicine  . Collagen vascular disease ([place])   . Crack cocaine use    for 20+ years, has been enrolled in detox programs in the past  . Depression    with history of hospitalization for suicidal ideation  . Diabetes mellitus 2002   Diagnosed in 2002, started insulin in 2012  .  Gout   . Gout 04/28/2012  . Headache(784.0)    CT head 08/2011: Periventricular and subcortical white matter hypodensities are most in keeping with chronic microangiopathic change  . HIV infection [facility]) Nov 2012   Followed by [name]  . Hyperlipidemia   . Hypertension   . Pulmonary embolism [facility])     Past Surgical History:  Procedure Laterality Date  . BACK SURGERY     1988  . BOWEL RESECTION     . CARDIAC SURGERY    . CERVICAL SPINE SURGERY     " rods in my neck "  . CORONARY ARTERY BYPASS GRAFT    . CORONARY STENT PLACEMENT    . NM MYOCAR PERF WALL MOTION  12/27/2011   normal  . SPINE SURGERY      Family History:  Family History  Problem Relation Age of Onset  . Diabetes Mother   . Hypertension Mother   . Hyperlipidemia Mother   . Diabetes Father   . Cancer Father   . Hypertension Father   . Diabetes Brother   . Heart disease Brother   . Diabetes Sister   . Colon cancer Neg Hx     Social History:  reports that he has never smoked. He has never used smokeless tobacco. He reports current drug use. Drugs: "Crack" cocaine and Cocaine. He reports that he does not drink alcohol.  Additional Social History:  Alcohol / Drug Use Pain Medications: See MAR Prescriptions: See MAR Over the Counter: See MAR History of alcohol / drug use?: Yes Longest period of sobriety (when/how long): Unknown Negative Consequences of Use: (Denies) Withdrawal Symptoms: (Denies) Substance #1 Name of Substance 1: Cocaine 1 - Age of First Use: 34 1 - Amount (size/oz): Varies 1 - Frequency: Varies 1 - Duration: Ongoing 1 - Last Use / Amount: 06/11/19 1 gram  CIWA: CIWA-Ar BP: (!) 157/103 Pulse Rate: 85 COWS:    Allergies: No Known Allergies  Home Medications: (Not in a hospital admission)   OB/GYN Status:  No LMP for male patient.  General Assessment Data [facility] of Assessment: [facility] TTS Assessment: In system Is this a Tele or Face-to-Face Assessment?: Face-to-Face Is this an Initial Assessment or a Re-assessment for this encounter?: Initial Assessment Patient Accompanied by:: N/A Language Other than English: No Living Arrangements: Other (Comment) What gender do you identify as?: Male Marital status: Widowed Living Arrangements: Alone Can pt return to current living arrangement?: Yes Admission Status: Voluntary Is patient capable of signing voluntary admission?:  Yes Referral Source: Self/Family/Friend Insurance type: Facilities manager Exam ([place]) Medical Exam completed: Yes  Crisis Care Plan Living Arrangements: Alone Legal Guardian: (NA) Name of Psychiatrist: None Name of Therapist: None  Education Status Is patient currently in school?: No Is the patient employed, unemployed or receiving disability?: Unemployed  Risk to self with the past 6 months Suicidal Ideation: Yes-Currently Present Has patient been a risk to self within the past 6 months prior to admission? : No Suicidal Intent: Yes-Currently Present Has patient had any suicidal intent within the past 6 months prior to admission? : No Is patient at risk for suicide?: Yes Suicidal Plan?: Yes-Currently Present Has patient had any suicidal plan within the past 6 months prior to admission? : No Specify Current Suicidal Plan: OD on cocaine Access to Means: Yes Specify Access to Suicidal Means: Pt has drugs What has been your use of drugs/alcohol within the last 12 months?: Current use Previous Attempts/Gestures: Yes  How many times?: (Multiple) Other Self Harm Risks: (Off medications) Triggers for Past Attempts: Unknown Intentional Self Injurious Behavior: None Family Suicide History: No Recent stressful life event(s): Other (Comment)(Excessive SA use) Persecutory voices/beliefs?: No Depression: Yes Depression Symptoms: Feeling worthless/self pity Substance abuse history and/or treatment for substance abuse?: No Suicide prevention information given to non-admitted patients: Not applicable  Risk to Others within the past 6 months Homicidal Ideation: No Does patient have any lifetime risk of violence toward others beyond the six months prior to admission? : No Thoughts of Harm to Others: No Current Homicidal Intent: No Current Homicidal Plan: No Access to Homicidal Means: No Identified Victim: NA History of harm to others?: No Assessment of  Violence: None Noted Violent Behavior Description: NA Does patient have access to weapons?: No Criminal Charges Pending?: No Does patient have a court date: No Is patient on probation?: No  Psychosis Hallucinations: None noted Delusions: None noted  Mental Status Report Appearance/Hygiene: In scrubs Eye Contact: Poor Motor Activity: Freedom of movement Speech: Logical/coherent Level of Consciousness: Drowsy Mood: Depressed Affect: Appropriate to circumstance Anxiety Level: Minimal Thought Processes: Coherent, Relevant Judgement: Partial Orientation: Person, Place, Time Obsessive Compulsive Thoughts/Behaviors: None  Cognitive Functioning Concentration: Normal Memory: Recent Intact, Remote Intact Is patient IDD: No Insight: Fair Impulse Control: Poor Appetite: Fair Have you had any weight changes? : No Change Sleep: No Change Total Hours of Sleep: 6 Vegetative Symptoms: None  ADLScreening [facility] Assessment Services) Patient's cognitive ability adequate to safely complete daily activities?: Yes Patient able to express need for assistance with ADLs?: Yes Independently performs ADLs?: Yes (appropriate for developmental age)  Prior Inpatient Therapy Prior Inpatient Therapy: Yes Prior Therapy Dates: 2019, 2018 Prior Therapy Facilty/Provider(s): [facility], [facility] Reason for Treatment: MH issues  Prior Outpatient Therapy Prior Outpatient Therapy: Yes Prior Therapy Dates: 2019 Prior Therapy Facilty/Provider(s): [facility] Reason for Treatment: Med mang Does patient have an ACCT team?: No Does patient have Intensive In-House Services?  : No Does patient have [facility] services? : Yes Does patient have P4CC services?: No  ADL Screening (condition at time of admission) Patient's cognitive ability adequate to safely complete daily activities?: Yes Is the patient deaf or have difficulty hearing?: No Does the patient have difficulty seeing, even when wearing glasses/contacts?: No Does  the patient have difficulty concentrating, remembering, or making decisions?: No Patient able to express need for assistance with ADLs?: Yes Does the patient have difficulty dressing or bathing?: No Independently performs ADLs?: Yes (appropriate for developmental age) Does the patient have difficulty walking or climbing stairs?: No Weakness of Legs: None Weakness of Arms/Hands: None  Home Assistive Devices/Equipment Home Assistive Devices/Equipment: None  Therapy Consults (therapy consults require a physician order) PT Evaluation Needed: No OT Evalulation Needed: No SLP Evaluation Needed: No Abuse/Neglect Assessment (Assessment to be complete while patient is alone) Physical Abuse: Denies Verbal Abuse: Denies Sexual Abuse: Denies Exploitation of patient/patient's resources: Denies Self-Neglect: Denies Values / Beliefs Cultural Requests During Hospitalization: None Spiritual Requests During Hospitalization: None Consults Spiritual Care Consult Needed: No Social Work Consult Needed: No Regulatory affairs officer (For Healthcare) Does Patient Have a Medical Advance Directive?: No Would patient like information on creating a medical advance directive?: No - Patient declined          Disposition: Per Starkes NP patient will be observed and monitored.  Disposition Initial Assessment Completed for this Encounter: Yes Disposition of Patient: (Observe and monitor) Patient refused recommended treatment: No Mode of transportation if patient is discharged/movement?: (UNK)  On  Site Evaluation by:   Reviewed with Physician:    Mamie Nick 06/12/2019 1:20 PM

## 2019-06-12 NOTE — ED Notes (Signed)
Pt stated still having SI, taking cocaine. Pt endorsed hearing voices, unable to clearly  assess what the voices saying to pt.

## 2019-06-12 NOTE — ED Notes (Signed)
1 patient belonging bag in 68

## 2019-06-13 DIAGNOSIS — R440 Auditory hallucinations: Secondary | ICD-10-CM

## 2019-06-13 DIAGNOSIS — R45851 Suicidal ideations: Secondary | ICD-10-CM

## 2019-06-13 LAB — SARS CORONAVIRUS 2 BY RT PCR (HOSPITAL ORDER, PERFORMED IN ~~LOC~~ HOSPITAL LAB): SARS Coronavirus 2: NEGATIVE

## 2019-06-13 LAB — CBG MONITORING, ED: Glucose-Capillary: 133 mg/dL — ABNORMAL HIGH (ref 70–99)

## 2019-06-13 IMAGING — NM NM PULMONARY VENT & PERF
15 series · 15 of 15 positions shown · non-contrast
Comparison: Chest radiographs 05/03/2018.  Chest CTA 04/11/2018.

CLINICAL DATA: Chest pain and shortness of breath daily for 2
months. Elevated D-dimer levels.

EXAM:
NUCLEAR MEDICINE VENTILATION - PERFUSION LUNG SCAN
TECHNIQUE: Ventilation images were obtained in multiple projections using
inhaled aerosol 8c-ZZm DTPA. Perfusion images were obtained in
multiple projections after intravenous injection of Cc-IIm-DOO.
RADIOPHARMACEUTICALS:  32.2 mCi of 8c-ZZm DTPA aerosol inhalation
and 4.2 mCi [id number] IV

[Series 1: ant/post vent · 4.14mm/px · 1 of 1 slices shown (1 of 2)]
[im 1/1]
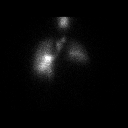

[Series 1: ant/post vent · 4.14mm/px · 1 of 1 slices shown (2 of 2)]
[im 1/1]
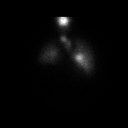

[Series 2: lao/rpo vent · 4.14mm/px · 1 of 1 slices shown (1 of 2)]
[im 1/1]
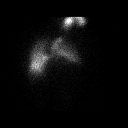

[Series 2: lao/rpo vent · 4.14mm/px · 1 of 1 slices shown (2 of 2)]
[im 1/1]
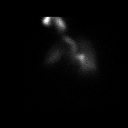

[Series 3: lpo/rao vent · 4.14mm/px · 1 of 1 slices shown (1 of 2)]
[im 1/1]
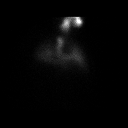

[Series 3: lpo/rao vent · 4.14mm/px · 1 of 1 slices shown (2 of 2)]
[im 1/1]
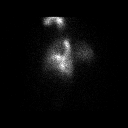

[Series 4: lt lat/rt lat vent · 4.14mm/px · 1 of 1 slices shown (1 of 2)]
[im 1/1]
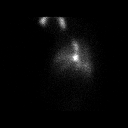

[Series 4: lt lat/rt lat vent · 4.14mm/px · 1 of 1 slices shown (2 of 2)]
[im 1/1]
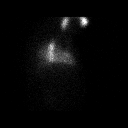

[Series 5: lt lat/rt lat perf · 4.14mm/px · 1 of 1 slices shown (1 of 2)]
[im 1/1]
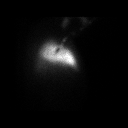

[Series 5: lt lat/rt lat perf · 4.14mm/px · 1 of 1 slices shown (2 of 2)]
[im 1/1]
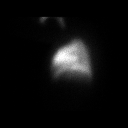

[Series 6: lpo/rao perf · 4.14mm/px · 1 of 1 slices shown (1 of 2)]
[im 1/1]
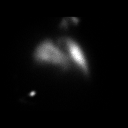

[Series 6: lpo/rao perf · 4.14mm/px · 1 of 1 slices shown (2 of 2)]
[im 1/1]
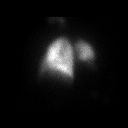

[Series 7: ant/post perf · 4.14mm/px · 1 of 1 slices shown (1 of 2)]
[im 1/1]
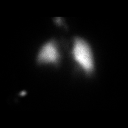

[Series 7: ant/post perf · 4.14mm/px · 1 of 1 slices shown (2 of 2)]
[im 1/1]
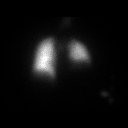

[Series 8: lao/rpo perf · 4.14mm/px · 1 of 1 slices shown]
[im 1/1]
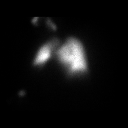

[15 of 15 positions shown; findings below may reference images not displayed]

FINDINGS: Ventilation: No focal ventilation defect. There is mild clumping of
the aerosol in the trachea and left central airways. There is
hypoventilation of the right lung base, similar to previous studies.

Perfusion: No wedge shaped peripheral perfusion defects to suggest
acute pulmonary embolism.
IMPRESSION: No evidence of acute or chronic pulmonary thromboembolic disease.

## 2019-06-13 MED ORDER — ALLOPURINOL 300 MG PO TABS: 300.0000 mg | ORAL_TABLET | Freq: Every day | ORAL | Status: DC

## 2019-06-13 MED ORDER — HYDROXYZINE HCL 25 MG PO TABS: 25.0000 mg | ORAL_TABLET | Freq: Three times a day (TID) | ORAL | Status: DC | PRN

## 2019-06-13 MED ORDER — SERTRALINE HCL 50 MG PO TABS
50.0000 mg | ORAL_TABLET | Freq: Every day | ORAL | Status: DC
  Administered 2019-06-13 – 2019-06-15 (×3): 50 mg via ORAL
  Filled 2019-06-13 (×3): qty 1

## 2019-06-13 MED ORDER — METFORMIN HCL 500 MG PO TABS
1000.0000 mg | ORAL_TABLET | Freq: Two times a day (BID) | ORAL | Status: DC
  Administered 2019-06-13 – 2019-06-15 (×5): 1000 mg via ORAL
  Filled 2019-06-13 (×4): qty 2

## 2019-06-13 MED ORDER — ACETAMINOPHEN 325 MG PO TABS
650.0000 mg | ORAL_TABLET | ORAL | Status: DC | PRN
  Administered 2019-06-13: 650 mg via ORAL
  Filled 2019-06-13: qty 2

## 2019-06-13 MED ORDER — ALBUTEROL SULFATE HFA 108 (90 BASE) MCG/ACT IN AERS: 2.0000 | INHALATION_SPRAY | Freq: Four times a day (QID) | RESPIRATORY_TRACT | Status: DC | PRN

## 2019-06-13 MED ORDER — FAMOTIDINE 20 MG PO TABS: 20.0000 mg | ORAL_TABLET | Freq: Two times a day (BID) | ORAL | Status: DC

## 2019-06-13 MED ORDER — GABAPENTIN 400 MG PO CAPS
400.0000 mg | ORAL_CAPSULE | Freq: Two times a day (BID) | ORAL | Status: DC
  Administered 2019-06-13 – 2019-06-15 (×5): 400 mg via ORAL
  Filled 2019-06-13 (×5): qty 1

## 2019-06-13 MED ORDER — TRAZODONE HCL 50 MG PO TABS: 50.0000 mg | ORAL_TABLET | Freq: Every evening | ORAL | Status: DC | PRN

## 2019-06-13 MED ORDER — EMTRICITAB-RILPIVIR-TENOFOV AF 200-25-25 MG PO TABS
1.0000 | ORAL_TABLET | Freq: Every day | ORAL | Status: DC
  Administered 2019-06-13 – 2019-06-15 (×3): 1 via ORAL
  Filled 2019-06-13 (×3): qty 1

## 2019-06-13 NOTE — ED Notes (Signed)
Up to the bathroom 

## 2019-06-13 NOTE — ED Notes (Signed)
telepsych eval in progress by Dr Tiburcio Pea

## 2019-06-13 NOTE — ED Notes (Signed)
Pt sleeping, easily aroused.  Pt alert/oriented, denies hi, vh but reports continuing suicidal thoughts.  W/o a plan.  Pt reports that he hears voices that tell him to "hurt myself."

## 2019-06-13 NOTE — Progress Notes (Signed)
Patient meets criteria for inpatient treatment. No appropriate or available beds at [facility]. CSW faxed referrals to the following facilities for review:  [facility]  [facility]  [facility]  [facility]  [facility]   TTS will continue to seek bed placement.  Chalmers Guest. Guerry Bruin, MSW, [place] Work/Disposition Phone: [phone] Fax: [phone]

## 2019-06-13 NOTE — ED Notes (Signed)
Nad, eating lunch

## 2019-06-13 NOTE — Consult Note (Addendum)
[facility] Psych Consult Progress Note  06/13/2019 12:12 PM Michael [name]  MRN:  [id number] Subjective:  Im suicidal and hearing voices.   HPI: Michael [name] is an 68 y.o. male that presents this date with S/I. Patient voices a plan to overdose on cocaine or other street drugs. Patient denies any H/I or AVH. Patient is observed to be very drowsy at the time of assessment and renders limited history. Patient states he is currently homeless after attempting to reconnect with a family member who could provide housing. Patient states "that fell through with" and started feeling suicidal with "no hope left." Patient states he was diagnosed with depression "years ago" and cannot recall time frame or provider who originally diagnosed him. Patient reports multiple attempts at self harm over the last two years and is well known to [facility] with three previous psychiatric hospitalizations since December 2019 at [facility] [place] and [place] for similar presentations. Patient has a hong history of cocaine use beginning at age 52. Patient states that over the past few days that he has used 500 worth of cocaine. Patient has been receiving OP services from [facility] who assisted with medication management for ongoing symptoms of depression although reports he has not seen that provider since earlier this year and has not been on any medications to assist with symptom management. He states that he has been depressed the last week with ongoing symptoms to include: feeling worthless and isolating. Patient states that he is experiencing sleep disturbance and he is only sleeping two hours per night and states that he has not been eating well. Patient states that he is widowed and on disability. Patient states that he has had triple bypass surgery, but he has continued to use cocaine. Patient presents as drowsy although oriented x 4.His mood depressed and his affect flat. His thoughts are organized and his memory  intact. His judgment, insight and impulse control are impaired. He is not currently responding to internal stimuli. His speech is soft and his eye contact was poor.His psycho-motor activity is unremarkable. Per notes    patient presents to the emergency department with suicidal ideation. The patient states he is been having chest pain for over a year since his triple bypass. Patient states that the suicidal thoughts are fairly constant but it got worse over the last few weeks. Per Starkes NP patient will be observed and monitored.   Principal Problem: Severe recurrent major depressive disorder with psychotic features ([place]) Diagnosis:  Principal Problem:   Severe recurrent major depressive disorder with psychotic features ([place])  Total Time spent with patient: 15 minutes  Past Psychiatric History: MDD, cocaine abuse and GAD.   Past Medical History:  Past Medical History:  Diagnosis Date  . A-fib ([place])   . Asthma    No PFTs, history of childhood asthma  . CAD (coronary artery disease)    a. 06/2013 STEMI/PCI ([facility]): LAD w/ thrombus (treated with BMS), mid 75%, D2 75%; LCX OM2 75%; RCA small, PDA 95%, PLV 95%;  b. 10/2013 Cath/PCI: ISR w/in LAD (Promus DES x 2), borderline OM2 lesion;  c. 01/2014 MV: Intermediate risk, medium-sized distal ant wall infarct w/ very small amt of peri-infarct ischemia. EF 60%.  . Cellulitis 04/2014   left facial  . Chondromalacia of medial femoral condyle    Left knee MRI 04/28/12: Chondromalacia of the medial femoral condyle with slight peripheral degeneration of the meniscocapsular junction of the medial meniscus; followed by sports medicine  . Collagen  vascular disease ([place])   . Crack cocaine use    for 20+ years, has been enrolled in detox programs in the past  . Depression    with history of hospitalization for suicidal ideation  . Diabetes mellitus 2002   Diagnosed in 2002, started insulin in 2012  . Gout   . Gout 04/28/2012  . Headache(784.0)    CT head  08/2011: Periventricular and subcortical white matter hypodensities are most in keeping with chronic microangiopathic change  . HIV infection [facility]) Nov 2012   Followed by [name]  . Hyperlipidemia   . Hypertension   . Pulmonary embolism [facility])     Past Surgical History:  Procedure Laterality Date  . BACK SURGERY     1988  . BOWEL RESECTION    . CARDIAC SURGERY    . CERVICAL SPINE SURGERY     " rods in my neck "  . CORONARY ARTERY BYPASS GRAFT    . CORONARY STENT PLACEMENT    . NM MYOCAR PERF WALL MOTION  12/27/2011   normal  . SPINE SURGERY     Family History:  Family History  Problem Relation Age of Onset  . Diabetes Mother   . Hypertension Mother   . Hyperlipidemia Mother   . Diabetes Father   . Cancer Father   . Hypertension Father   . Diabetes Brother   . Heart disease Brother   . Diabetes Sister   . Colon cancer Neg Hx    Family Psychiatric  History: Denies  Social History:  Social History   Substance and Sexual Activity  Alcohol Use No  . Alcohol/week: 4.0 standard drinks  . Types: 2 Cans of beer, 2 Shots of liquor per week     Social History   Substance and Sexual Activity  Drug Use Yes  . Types: "Crack" cocaine, Cocaine    Social History   Socioeconomic History  . Marital status: Widowed    Spouse name: Not on file  . Number of children: 2  . Years of education: 70  . Highest education level: 12th grade  Occupational History    Employer: UNEMPLOYED    Comment: 04/2016  Social Needs  . Financial resource strain: Somewhat hard  . Food insecurity    Worry: Sometimes true    Inability: Sometimes true  . Transportation needs    Medical: Yes    Non-medical: Yes  Tobacco Use  . Smoking status: Never Smoker  . Smokeless tobacco: Never Used  Substance and Sexual Activity  . Alcohol use: No    Alcohol/week: 4.0 standard drinks    Types: 2 Cans of beer, 2 Shots of liquor per week  . Drug use: Yes    Types: "Crack" cocaine, Cocaine  . Sexual  activity: Yes    Comment: accepted condoms  Lifestyle  . Physical activity    Days per week: 0 days    Minutes per session: 0 min  . Stress: Very much  Relationships  . Social Herbalist on phone: Once a week    Gets together: Never    Attends religious service: Never    Active member of club or organization: No    Attends meetings of clubs or organizations: Never    Relationship status: Widowed  Other Topics Concern  . Not on file  Social History Narrative   Currently staying with a friend in [place].  Was staying @ local motel until a few days ago - left b/c  of bed bugs.    Sleep: Good  Appetite:  Good  Current Medications: Current Facility-Administered Medications  Medication Dose Route Frequency Provider Last Rate Last Dose  . acetaminophen (TYLENOL) tablet 650 mg  650 mg Oral Q4H PRN Daleen Bo, MD   650 mg at 06/13/19 1210  . albuterol (VENTOLIN HFA) 108 (90 Base) MCG/ACT inhaler 2 puff  2 puff Inhalation Q6H PRN Corena Pilgrim, MD      . emtricitabine-rilpivir-tenofovir AF (ODEFSEY) 200-25-25 MG per tablet 1 tablet  1 tablet Oral Q breakfast Trista Ciocca, MD   1 tablet at 06/13/19 1210  . gabapentin (NEURONTIN) capsule 400 mg  400 mg Oral BID Cartha Rotert, MD   400 mg at 06/13/19 1210  . hydrOXYzine (ATARAX/VISTARIL) tablet 25 mg  25 mg Oral TID PRN Densel Kronick, MD      . metFORMIN (GLUCOPHAGE) tablet 1,000 mg  1,000 mg Oral BID WC Laysa Kimmey, MD   1,000 mg at 06/13/19 1210  . sertraline (ZOLOFT) tablet 50 mg  50 mg Oral Daily Oluwademilade Mckiver, MD   50 mg at 06/13/19 1210  . traZODone (DESYREL) tablet 50 mg  50 mg Oral QHS PRN Corena Pilgrim, MD       Current Outpatient Medications  Medication Sig Dispense Refill  . acetaminophen (TYLENOL) 500 MG tablet Take 1,000 mg by mouth every 6 (six) hours as needed for moderate pain or headache.    . albuterol (PROAIR HFA) 108 (90 Base) MCG/ACT inhaler INHALE TWO PUFFS INTO THE LUNGS EVERY 6 (SIX)  HOURS AS NEEDED FOR WHEEZING OR SHORTNESS OF BREATH. (Patient taking differently: Inhale 2 puffs into the lungs every 6 (six) hours as needed for wheezing or shortness of breath. ) 8.5 Inhaler 0  . aspirin 81 MG chewable tablet Chew 81 mg by mouth daily.    Marland Kitchen emtricitabine-rilpivir-tenofovir AF (ODEFSEY) 200-25-25 MG TABS tablet Take 1 tablet by mouth daily with breakfast. 30 tablet 6  . Fluticasone Propionate, Inhal, (FLOVENT DISKUS) 100 MCG/BLIST AEPB Inhale 1 puff into the lungs 2 (two) times daily.    Marland Kitchen gabapentin (NEURONTIN) 400 MG capsule Take 1 capsule (400 mg total) by mouth 2 (two) times daily. For agitation/neuropathic pain 60 capsule 0  . hydrOXYzine (ATARAX/VISTARIL) 25 MG tablet Take 1 tablet (25 mg total) by mouth 3 (three) times daily as needed for anxiety. 90 tablet 0  . insulin aspart (NOVOLOG) 100 UNIT/ML injection Before each meal 3 times a day , 140-199 - 2 units, 200-250 - 4 units, 251-299 - 6 units,  300-349 - 8 units,  350 or above 10 units. Insulin PEN if approved, provide syringes and needles if needed. Can switch to any other cheaper alternative. (Patient taking differently: Inject 2-10 Units into the skin See admin instructions. Before each meal 3 times a day , 140-199 - 2 units, 200-250 - 4 units, 251-299 - 6 units,  300-349 - 8 units,  350 or above 10 units. Insulin PEN if approved, provide syringes and needles if needed. Can switch to any other cheaper alternative.) 10 mL 0  . insulin glargine (LANTUS) 100 UNIT/ML injection Inject 0.2 mLs (20 Units total) into the skin at bedtime. Dispense insulin pen if approved, if not dispense as needed syringes and needles for 1 month supply. Can switch to Levemir. Diagnosis E 11.65. 10 mL 0  . metFORMIN (GLUCOPHAGE) 1000 MG tablet Take 1,000 mg by mouth 2 (two) times daily.    . sertraline (ZOLOFT) 50 MG tablet Take 1 tablet (  50 mg total) by mouth daily. 30 tablet 0  . traZODone (DESYREL) 50 MG tablet Take 1 tablet (50 mg total) by mouth  at bedtime as needed for sleep. 30 tablet 0  . allopurinol (ZYLOPRIM) 300 MG tablet Take 1 tablet (300 mg total) by mouth daily. For gout. (Patient not taking: Reported on 06/11/2019) 30 tablet 0  . benzonatate (TESSALON) 100 MG capsule Take 2 capsules (200 mg total) by mouth 3 (three) times daily as needed for cough. 21 capsule 0  . blood glucose meter kit and supplies KIT Dispense based on patient and insurance preference. Use up to four times daily as directed. (FOR ICD-9 250.00, 250.01). For QAC - HS accuchecks. 1 each 1  . famotidine (PEPCID) 20 MG tablet Take 1 tablet (20 mg total) by mouth 2 (two) times daily. (Patient not taking: Reported on 06/11/2019) 60 tablet 1  . guaiFENesin (MUCINEX) 600 MG 12 hr tablet Take 1 tablet (600 mg total) by mouth 2 (two) times daily. (Patient not taking: Reported on 06/11/2019) 30 tablet 0  . Insulin Syringe-Needle U-100 25G X 1" 1 ML MISC For 4 times a day insulin SQ, 1 month supply. Diagnosis E11.65 30 each 0  . polyethylene glycol (MIRALAX / GLYCOLAX) 17 g packet Take 17 g by mouth 2 (two) times daily. (Patient not taking: Reported on 06/11/2019) 15 each 0    Lab Results:  Results for orders placed or performed during the hospital encounter of 06/11/19 (from the past 48 hour(s))  CBG monitoring, ED     Status: Abnormal   Collection Time: 06/11/19  4:05 PM  Result Value Ref Range   Glucose-Capillary 272 (H) 70 - 99 mg/dL  Comprehensive metabolic panel     Status: Abnormal   Collection Time: 06/11/19  4:58 PM  Result Value Ref Range   Sodium 138 135 - 145 mmol/L   Potassium 4.1 3.5 - 5.1 mmol/L   Chloride 107 98 - 111 mmol/L   CO2 21 (L) 22 - 32 mmol/L   Glucose, Bld 297 (H) 70 - 99 mg/dL   BUN 25 (H) 8 - 23 mg/dL   Creatinine, Ser 1.86 (H) 0.61 - 1.24 mg/dL   Calcium 9.3 8.9 - 10.3 mg/dL   Total Protein 8.0 6.5 - 8.1 g/dL   Albumin 3.9 3.5 - 5.0 g/dL   AST 17 15 - 41 U/L   ALT 14 0 - 44 U/L   Alkaline Phosphatase 65 38 - 126 U/L   Total Bilirubin  0.4 0.3 - 1.2 mg/dL   GFR calc non Af Amer 36 (L) >60 mL/min   GFR calc Af Amer 42 (L) >60 mL/min   Anion gap 10 5 - 15    Comment: Performed at [facility], [place] [address], [place], [place] [address]  Ethanol     Status: Abnormal   Collection Time: 06/11/19  4:58 PM  Result Value Ref Range   Alcohol, Ethyl (B) 17 (H) <10 mg/dL    Comment: (NOTE) Lowest detectable limit for serum alcohol is 10 mg/dL. For medical purposes only. Performed at [facility], [place] [address], [place], [place] [address]   Salicylate level     Status: None   Collection Time: 06/11/19  4:58 PM  Result Value Ref Range   Salicylate Lvl <7.4 2.8 - 30.0 mg/dL    Comment: Performed at [facility], [place] [address], [place], [place] [address]  Acetaminophen level     Status: Abnormal  Collection Time: 06/11/19  4:58 PM  Result Value Ref Range   Acetaminophen (Tylenol), Serum <10 (L) 10 - 30 ug/mL    Comment: (NOTE) Therapeutic concentrations vary significantly. A range of 10-30 ug/mL  may be an effective concentration for many patients. However, some  are best treated at concentrations outside of this range. Acetaminophen concentrations >150 ug/mL at 4 hours after ingestion  and >50 ug/mL at 12 hours after ingestion are often associated with  toxic reactions. Performed at [facility], [place] [address] [place], [place] [address]   cbc     Status: Abnormal   Collection Time: 06/11/19  4:58 PM  Result Value Ref Range   WBC 5.4 4.0 - 10.5 K/uL   RBC 3.79 (L) 4.22 - 5.81 MIL/uL   Hemoglobin 9.8 (L) 13.0 - 17.0 g/dL   HCT 31.1 (L) 39.0 - 52.0 %   MCV 82.1 80.0 - 100.0 fL   MCH 25.9 (L) 26.0 - 34.0 pg   MCHC 31.5 30.0 - 36.0 g/dL   RDW 14.1 11.5 - 15.5 %   Platelets 171 150 - 400 K/uL   nRBC 0.0 0.0 - 0.2 %    Comment: Performed at [facility], [place] [address] [place], [place] [address]  Rapid urine drug screen  (hospital performed)     Status: Abnormal   Collection Time: 06/12/19 10:26 AM  Result Value Ref Range   Opiates NONE DETECTED NONE DETECTED   Cocaine POSITIVE (A) NONE DETECTED   Benzodiazepines NONE DETECTED NONE DETECTED   Amphetamines NONE DETECTED NONE DETECTED   Tetrahydrocannabinol NONE DETECTED NONE DETECTED   Barbiturates NONE DETECTED NONE DETECTED    Comment: (NOTE) DRUG SCREEN FOR MEDICAL PURPOSES ONLY.  IF CONFIRMATION IS NEEDED FOR ANY PURPOSE, NOTIFY LAB WITHIN 5 DAYS. LOWEST DETECTABLE LIMITS FOR URINE DRUG SCREEN Drug Class                     Cutoff (ng/mL) Amphetamine and metabolites    1000 Barbiturate and metabolites    200 Benzodiazepine                 026 Tricyclics and metabolites     300 Opiates and metabolites        300 Cocaine and metabolites        300 THC                            50 Performed at [facility], [place] [address], [place], [place] [address]   SARS Coronavirus 2 [facility] order, Performed in [facility] hospital lab) Nasopharyngeal Nasopharyngeal Swab     Status: None   Collection Time: 06/13/19  8:30 AM   Specimen: Nasopharyngeal Swab  Result Value Ref Range   SARS Coronavirus 2 NEGATIVE NEGATIVE    Comment: (NOTE) If result is NEGATIVE SARS-CoV-2 target nucleic acids are NOT DETECTED. The SARS-CoV-2 RNA is generally detectable in upper and lower  respiratory specimens during the acute phase of infection. The lowest  concentration of SARS-CoV-2 viral copies this assay can detect is 250  copies / mL. A negative result does not preclude SARS-CoV-2 infection  and should not be used as the sole basis for treatment or other  patient management decisions.  A negative result may occur with  improper specimen collection / handling, submission of specimen other  than nasopharyngeal swab, presence of viral mutation(s) within the  areas  targeted by this assay, and inadequate number of viral copies  (<250 copies / mL). A  negative result must be combined with clinical  observations, patient history, and epidemiological information. If result is POSITIVE SARS-CoV-2 target nucleic acids are DETECTED. The SARS-CoV-2 RNA is generally detectable in upper and lower  respiratory specimens dur ing the acute phase of infection.  Positive  results are indicative of active infection with SARS-CoV-2.  Clinical  correlation with patient history and other diagnostic information is  necessary to determine patient infection status.  Positive results do  not rule out bacterial infection or co-infection with other viruses. If result is PRESUMPTIVE POSTIVE SARS-CoV-2 nucleic acids MAY BE PRESENT.   A presumptive positive result was obtained on the submitted specimen  and confirmed on repeat testing.  While 2019 novel coronavirus  (SARS-CoV-2) nucleic acids may be present in the submitted sample  additional confirmatory testing may be necessary for epidemiological  and / or clinical management purposes  to differentiate between  SARS-CoV-2 and other Sarbecovirus currently known to infect humans.  If clinically indicated additional testing with an alternate test  methodology [phone]) is advised. The SARS-CoV-2 RNA is generally  detectable in upper and lower respiratory sp ecimens during the acute  phase of infection. The expected result is Negative. Fact Sheet for Patients:  StrictlyIdeas.no Fact Sheet for Healthcare Providers: BankingDealers.co.za This test is not yet approved or cleared by the [place] FDA and has been authorized for detection and/or diagnosis of SARS-CoV-2 by FDA under an Emergency Use Authorization (EUA).  This EUA will remain in effect (meaning this test can be used) for the duration of the COVID-19 declaration under Section 564(b)(1) of the Act, 21 U.S.C. section 360bbb-3(b)(1), unless the authorization is terminated or revoked sooner. Performed  at [facility], [place] [address] [place], [place] [address]     Blood Alcohol level:  Lab Results  Component Value Date   ETH 17 (H) 06/11/2019   ETH <10 01/10/2019   Musculoskeletal: Strength & Muscle Tone: within normal limits Gait & Station: UTA since patient is sitting in a chair. Patient leans: N/A  Psychiatric Specialty Exam: Physical Exam  Nursing note and vitals reviewed. Constitutional: He is oriented to person, place, and time. He appears well-developed and well-nourished.  HENT:  Head: Normocephalic and atraumatic.  Neck: Normal range of motion.  Respiratory: Effort normal.  Musculoskeletal: Normal range of motion.  Neurological: He is alert and oriented to person, place, and time.  Psychiatric: His speech is normal and behavior is normal. Judgment and thought content normal. Cognition and memory are normal. He exhibits a depressed mood.    Review of Systems  Cardiovascular: Negative for chest pain.  Gastrointestinal: Negative for abdominal pain, constipation, diarrhea, nausea and vomiting.  Psychiatric/Behavioral: Positive for depression, hallucinations, substance abuse and suicidal ideas (No plan or intention to harm self.). The patient is nervous/anxious.   All other systems reviewed and are negative.   Blood pressure (!) 139/93, pulse 78, temperature 98.5 F (36.9 C), temperature source Oral, resp. rate 18, SpO2 99 %.There is no height or weight on file to calculate BMI.  General Appearance: Fairly Groomed, elderly, African American male, wearing a hospital gown who is sitting on the edge of the bed. NAD.   Eye Contact:  Good  Speech:  Clear and Coherent and Normal Rate  Volume:  Normal  Mood:  Depressed  Affect:  Constricted  Thought Process:  Goal Directed, Linear and Descriptions of Associations: Intact  Orientation:  Full (Time, Place, and Person)  Thought Content:  Logical  Suicidal Thoughts:  Yes.  without intent/plan  Homicidal  Thoughts:  No  Memory:  Immediate;   Good Recent;   Good Remote;   Good  Judgement:  Fair  Insight:  Fair  Psychomotor Activity:  Normal  Concentration:  Concentration: Good and Attention Span: Good  Recall:  Good  Fund of Knowledge:  Good  Language:  Good  Akathisia:  No  Handed:  Right  AIMS (if indicated):   N/A  Assets:  Communication Skills Desire for Improvement Resilience Social Support  ADL's:  Intact  Cognition:  WNL  Sleep:   Okay   .   Treatment Plan Summary: -Continue Zoloft 50 mg daily for mood, Trazodone 50 mg qhs PRN for insomnia.   WIll continue to recommend inpatient at this time as patient continues to endorse suicidal thoughts.  Suella Broad, FNP 06/13/2019, 12:12 PM  Patient seen face-to-face for psychiatric evaluation, chart reviewed and case discussed with the physician extender and developed treatment plan. Reviewed the information documented and agree with the treatment plan. Corena Pilgrim, MD

## 2019-06-14 DIAGNOSIS — F333 Major depressive disorder, recurrent, severe with psychotic symptoms: Secondary | ICD-10-CM

## 2019-06-14 NOTE — BH Assessment (Signed)
[facility] Assessment Progress Note  Per Buford Dresser, DO, this voluntary pt requires psychiatric hospitalization at this time.  The following facilities have been contacted to seek placement for this pt, with results as noted:  Beds available, information sent, decision pending:  [place], [place] Coordinator [phone]

## 2019-06-14 NOTE — Consult Note (Addendum)
Telepsych Consultation   Reason for Consult:  SI Referring Physician:  EDP Location of Patient:  Location of Provider: [place] Department  Patient Identification: Michael [name] MRN:  [id number] Principal Diagnosis: Severe recurrent major depressive disorder with psychotic features ([place]) Diagnosis:  Principal Problem:   Severe recurrent major depressive disorder with psychotic features ([place])   Total Time spent with patient: 15 minutes  Subjective:   Michael [name] is a 68 y.o. male patient admitted with "I am trying to take myself out by smoking crack cocaine." Patient endorses SI with plan and intent. Denies HI. Patient endorses auditory hallucinations, "voices telling me I'm no good."  Endorses substance use, cocaine, "more than $40 daily."   HPI:  Per Consult Note: Patient states he is currently homeless after attempting to reconnect with a family member who could provide housing. Patient states "that fell through with" and started feeling suicidal with "no hope left."   Patient stressors include homelessness and substance use. Denies any hx trauma.   Past Psychiatric History: MDD, GAD, Cocaine Use Disorder  Risk to Self: Suicidal Ideation: Yes-Currently Present Suicidal Intent: Yes-Currently Present Is patient at risk for suicide?: Yes Suicidal Plan?: Yes-Currently Present Specify Current Suicidal Plan: OD on cocaine Access to Means: Yes Specify Access to Suicidal Means: Pt has drugs What has been your use of drugs/alcohol within the last 12 months?: Current use How many times?: (Multiple) Other Self Harm Risks: (Off medications) Triggers for Past Attempts: Unknown Intentional Self Injurious Behavior: None Risk to Others: Homicidal Ideation: No Thoughts of Harm to Others: No Current Homicidal Intent: No Current Homicidal Plan: No Access to Homicidal Means: No Identified Victim: NA History of harm to others?: No Assessment of Violence: None  Noted Violent Behavior Description: NA Does patient have access to weapons?: No Criminal Charges Pending?: No Does patient have a court date: No Prior Inpatient Therapy: Prior Inpatient Therapy: Yes Prior Therapy Dates: 2019, 2018 Prior Therapy Facilty/Provider(s): [facility], [name] Reason for Treatment: MH issues Prior Outpatient Therapy: Prior Outpatient Therapy: Yes Prior Therapy Dates: 2019 Prior Therapy Facilty/Provider(s): [facility] Reason for Treatment: Med mang Does patient have an ACCT team?: No Does patient have Intensive In-House Services?  : No Does patient have [facility] services? : Yes Does patient have P4[facility] services?: No  Past Medical History:  Past Medical History:  Diagnosis Date  . A-fib ([place])   . Asthma    No PFTs, history of childhood asthma  . CAD (coronary artery disease)    a. 06/2013 STEMI/PCI ([facility]): LAD w/ thrombus (treated with BMS), mid 75%, D2 75%; LCX OM2 75%; RCA small, PDA 95%, PLV 95%;  b. 10/2013 Cath/PCI: ISR w/in LAD (Promus DES x 2), borderline OM2 lesion;  c. 01/2014 MV: Intermediate risk, medium-sized distal ant wall infarct w/ very small amt of peri-infarct ischemia. EF 60%.  . Cellulitis 04/2014   left facial  . Chondromalacia of medial femoral condyle    Left knee MRI 04/28/12: Chondromalacia of the medial femoral condyle with slight peripheral degeneration of the meniscocapsular junction of the medial meniscus; followed by sports medicine  . Collagen vascular disease ([place])   . Crack cocaine use    for 20+ years, has been enrolled in detox programs in the past  . Depression    with history of hospitalization for suicidal ideation  . Diabetes mellitus 2002   Diagnosed in 2002, started insulin in 2012  . Gout   . Gout 04/28/2012  . Headache(784.0)    CT  head 08/2011: Periventricular and subcortical white matter hypodensities are most in keeping with chronic microangiopathic change  . HIV infection [facility]) Nov 2012   Followed by [name]  .  Hyperlipidemia   . Hypertension   . Pulmonary embolism [facility])     Past Surgical History:  Procedure Laterality Date  . BACK SURGERY     1988  . BOWEL RESECTION    . CARDIAC SURGERY    . CERVICAL SPINE SURGERY     " rods in my neck "  . CORONARY ARTERY BYPASS GRAFT    . CORONARY STENT PLACEMENT    . NM MYOCAR PERF WALL MOTION  12/27/2011   normal  . SPINE SURGERY     Family History:  Family History  Problem Relation Age of Onset  . Diabetes Mother   . Hypertension Mother   . Hyperlipidemia Mother   . Diabetes Father   . Cancer Father   . Hypertension Father   . Diabetes Brother   . Heart disease Brother   . Diabetes Sister   . Colon cancer Neg Hx    Family Psychiatric  History: Denies Social History:  Social History   Substance and Sexual Activity  Alcohol Use No  . Alcohol/week: 4.0 standard drinks  . Types: 2 Cans of beer, 2 Shots of liquor per week     Social History   Substance and Sexual Activity  Drug Use Yes  . Types: "Crack" cocaine, Cocaine    Social History   Socioeconomic History  . Marital status: Widowed    Spouse name: Not on file  . Number of children: 2  . Years of education: 75  . Highest education level: 12th grade  Occupational History    Employer: UNEMPLOYED    Comment: 04/2016  Social Needs  . Financial resource strain: Somewhat hard  . Food insecurity    Worry: Sometimes true    Inability: Sometimes true  . Transportation needs    Medical: Yes    Non-medical: Yes  Tobacco Use  . Smoking status: Never Smoker  . Smokeless tobacco: Never Used  Substance and Sexual Activity  . Alcohol use: No    Alcohol/week: 4.0 standard drinks    Types: 2 Cans of beer, 2 Shots of liquor per week  . Drug use: Yes    Types: "Crack" cocaine, Cocaine  . Sexual activity: Yes    Comment: accepted condoms  Lifestyle  . Physical activity    Days per week: 0 days    Minutes per session: 0 min  . Stress: Very much  Relationships  . Social  Herbalist on phone: Once a week    Gets together: Never    Attends religious service: Never    Active member of club or organization: No    Attends meetings of clubs or organizations: Never    Relationship status: Widowed  Other Topics Concern  . Not on file  Social History Narrative   Currently staying with a friend in [place].  Was staying @ local motel until a few days ago - left b/c of bed bugs.   Additional Social History: N/A    Allergies:  No Known Allergies  Labs:  Results for orders placed or performed during the hospital encounter of 06/11/19 (from the past 48 hour(s))  SARS Coronavirus 2 [facility] order, Performed in [facility] hospital lab) Nasopharyngeal Nasopharyngeal Swab     Status: None   Collection Time: 06/13/19  8:30 AM  Specimen: Nasopharyngeal Swab  Result Value Ref Range   SARS Coronavirus 2 NEGATIVE NEGATIVE    Comment: (NOTE) If result is NEGATIVE SARS-CoV-2 target nucleic acids are NOT DETECTED. The SARS-CoV-2 RNA is generally detectable in upper and lower  respiratory specimens during the acute phase of infection. The lowest  concentration of SARS-CoV-2 viral copies this assay can detect is 250  copies / mL. A negative result does not preclude SARS-CoV-2 infection  and should not be used as the sole basis for treatment or other  patient management decisions.  A negative result may occur with  improper specimen collection / handling, submission of specimen other  than nasopharyngeal swab, presence of viral mutation(s) within the  areas targeted by this assay, and inadequate number of viral copies  (<250 copies / mL). A negative result must be combined with clinical  observations, patient history, and epidemiological information. If result is POSITIVE SARS-CoV-2 target nucleic acids are DETECTED. The SARS-CoV-2 RNA is generally detectable in upper and lower  respiratory specimens dur ing the acute phase of infection.  Positive  results are  indicative of active infection with SARS-CoV-2.  Clinical  correlation with patient history and other diagnostic information is  necessary to determine patient infection status.  Positive results do  not rule out bacterial infection or co-infection with other viruses. If result is PRESUMPTIVE POSTIVE SARS-CoV-2 nucleic acids MAY BE PRESENT.   A presumptive positive result was obtained on the submitted specimen  and confirmed on repeat testing.  While 2019 novel coronavirus  (SARS-CoV-2) nucleic acids may be present in the submitted sample  additional confirmatory testing may be necessary for epidemiological  and / or clinical management purposes  to differentiate between  SARS-CoV-2 and other Sarbecovirus currently known to infect humans.  If clinically indicated additional testing with an alternate test  methodology [phone]) is advised. The SARS-CoV-2 RNA is generally  detectable in upper and lower respiratory sp ecimens during the acute  phase of infection. The expected result is Negative. Fact Sheet for Patients:  StrictlyIdeas.no Fact Sheet for Healthcare Providers: BankingDealers.co.za This test is not yet approved or cleared by the [place] FDA and has been authorized for detection and/or diagnosis of SARS-CoV-2 by FDA under an Emergency Use Authorization (EUA).  This EUA will remain in effect (meaning this test can be used) for the duration of the COVID-19 declaration under Section 564(b)(1) of the Act, 21 U.S.C. section 360bbb-3(b)(1), unless the authorization is terminated or revoked sooner. Performed at [facility], [name] [address], [place], [place] [address]   CBG monitoring, ED     Status: Abnormal   Collection Time: 06/13/19  4:15 PM  Result Value Ref Range   Glucose-Capillary 133 (H) 70 - 99 mg/dL    Medications:  Current Facility-Administered Medications  Medication Dose Route Frequency  Provider Last Rate Last Dose  . acetaminophen (TYLENOL) tablet 650 mg  650 mg Oral Q4H PRN Daleen Bo, MD   650 mg at 06/13/19 1210  . albuterol (VENTOLIN HFA) 108 (90 Base) MCG/ACT inhaler 2 puff  2 puff Inhalation Q6H PRN Corena Pilgrim, MD      . emtricitabine-rilpivir-tenofovir AF (ODEFSEY) 200-25-25 MG per tablet 1 tablet  1 tablet Oral Q breakfast Corena Pilgrim, MD   1 tablet at 06/14/19 0812  . gabapentin (NEURONTIN) capsule 400 mg  400 mg Oral BID Darleene Cleaver, Mojeed, MD   400 mg at 06/14/19 1103  . hydrOXYzine (ATARAX/VISTARIL) tablet 25 mg  25 mg Oral TID  PRN Corena Pilgrim, MD      . metFORMIN (GLUCOPHAGE) tablet 1,000 mg  1,000 mg Oral BID WC Akintayo, Mojeed, MD   1,000 mg at 06/14/19 0812  . sertraline (ZOLOFT) tablet 50 mg  50 mg Oral Daily Akintayo, Mojeed, MD   50 mg at 06/14/19 1103  . traZODone (DESYREL) tablet 50 mg  50 mg Oral QHS PRN Corena Pilgrim, MD       Current Outpatient Medications  Medication Sig Dispense Refill  . acetaminophen (TYLENOL) 500 MG tablet Take 1,000 mg by mouth every 6 (six) hours as needed for moderate pain or headache.    . albuterol (PROAIR HFA) 108 (90 Base) MCG/ACT inhaler INHALE TWO PUFFS INTO THE LUNGS EVERY 6 (SIX) HOURS AS NEEDED FOR WHEEZING OR SHORTNESS OF BREATH. (Patient taking differently: Inhale 2 puffs into the lungs every 6 (six) hours as needed for wheezing or shortness of breath. ) 8.5 Inhaler 0  . aspirin 81 MG chewable tablet Chew 81 mg by mouth daily.    Marland Kitchen emtricitabine-rilpivir-tenofovir AF (ODEFSEY) 200-25-25 MG TABS tablet Take 1 tablet by mouth daily with breakfast. 30 tablet 6  . Fluticasone Propionate, Inhal, (FLOVENT DISKUS) 100 MCG/BLIST AEPB Inhale 1 puff into the lungs 2 (two) times daily.    Marland Kitchen gabapentin (NEURONTIN) 400 MG capsule Take 1 capsule (400 mg total) by mouth 2 (two) times daily. For agitation/neuropathic pain 60 capsule 0  . hydrOXYzine (ATARAX/VISTARIL) 25 MG tablet Take 1 tablet (25 mg total) by  mouth 3 (three) times daily as needed for anxiety. 90 tablet 0  . insulin aspart (NOVOLOG) 100 UNIT/ML injection Before each meal 3 times a day , 140-199 - 2 units, 200-250 - 4 units, 251-299 - 6 units,  300-349 - 8 units,  350 or above 10 units. Insulin PEN if approved, provide syringes and needles if needed. Can switch to any other cheaper alternative. (Patient taking differently: Inject 2-10 Units into the skin See admin instructions. Before each meal 3 times a day , 140-199 - 2 units, 200-250 - 4 units, 251-299 - 6 units,  300-349 - 8 units,  350 or above 10 units. Insulin PEN if approved, provide syringes and needles if needed. Can switch to any other cheaper alternative.) 10 mL 0  . insulin glargine (LANTUS) 100 UNIT/ML injection Inject 0.2 mLs (20 Units total) into the skin at bedtime. Dispense insulin pen if approved, if not dispense as needed syringes and needles for 1 month supply. Can switch to Levemir. Diagnosis E 11.65. 10 mL 0  . metFORMIN (GLUCOPHAGE) 1000 MG tablet Take 1,000 mg by mouth 2 (two) times daily.    . sertraline (ZOLOFT) 50 MG tablet Take 1 tablet (50 mg total) by mouth daily. 30 tablet 0  . traZODone (DESYREL) 50 MG tablet Take 1 tablet (50 mg total) by mouth at bedtime as needed for sleep. 30 tablet 0  . allopurinol (ZYLOPRIM) 300 MG tablet Take 1 tablet (300 mg total) by mouth daily. For gout. (Patient not taking: Reported on 06/11/2019) 30 tablet 0  . benzonatate (TESSALON) 100 MG capsule Take 2 capsules (200 mg total) by mouth 3 (three) times daily as needed for cough. 21 capsule 0  . blood glucose meter kit and supplies KIT Dispense based on patient and insurance preference. Use up to four times daily as directed. (FOR ICD-9 250.00, 250.01). For QAC - HS accuchecks. 1 each 1  . famotidine (PEPCID) 20 MG tablet Take 1 tablet (20 mg total) by mouth  2 (two) times daily. (Patient not taking: Reported on 06/11/2019) 60 tablet 1  . guaiFENesin (MUCINEX) 600 MG 12 hr tablet Take 1  tablet (600 mg total) by mouth 2 (two) times daily. (Patient not taking: Reported on 06/11/2019) 30 tablet 0  . Insulin Syringe-Needle U-100 25G X 1" 1 ML MISC For 4 times a day insulin SQ, 1 month supply. Diagnosis E11.65 30 each 0  . polyethylene glycol (MIRALAX / GLYCOLAX) 17 g packet Take 17 g by mouth 2 (two) times daily. (Patient not taking: Reported on 06/11/2019) 15 each 0    Musculoskeletal: Strength & Muscle Tone: within normal limits Gait & Station: normal Patient leans: N/A  Psychiatric Specialty Exam: Physical Exam  Nursing note and vitals reviewed. Constitutional: He is oriented to person, place, and time. He appears well-developed.  HENT:  Head: Normocephalic and atraumatic.  Neck: Normal range of motion.  Cardiovascular: Normal rate.  Respiratory: Effort normal.  Neurological: He is alert and oriented to person, place, and time.  Psychiatric: His speech is normal and behavior is normal. Cognition and memory are normal. He expresses impulsivity. He exhibits a depressed mood. He expresses suicidal ideation. He expresses suicidal plans (Plan to overdose on crack cocaine).    Review of Systems  Constitutional: Negative.   HENT: Negative.   Eyes: Negative.   Respiratory: Negative.   Cardiovascular: Negative.   Gastrointestinal: Negative.   Genitourinary: Negative.   Musculoskeletal: Negative.   Skin: Negative.   Neurological: Negative.   Endo/Heme/Allergies: Negative.   Psychiatric/Behavioral: Positive for depression, substance abuse and suicidal ideas.    Blood pressure 131/87, pulse 86, temperature 98.4 F (36.9 C), temperature source Oral, resp. rate 16, SpO2 96 %.There is no height or weight on file to calculate BMI.  General Appearance: Casual  Eye Contact:  Fair  Speech:  Clear and Coherent  Volume:  Normal  Mood:  Depressed  Affect:  Congruent  Thought Process:  Coherent and Descriptions of Associations: Intact  Orientation:  Full (Time, Place, and Person)   Thought Content:  Logical  Suicidal Thoughts:  Yes.  with intent/plan  Homicidal Thoughts:  No  Memory:  Immediate;   Good Recent;   Good Remote;   Good  Judgement:  Fair  Insight:  Fair  Psychomotor Activity:  Normal  Concentration:  Concentration: Fair and Attention Span: Fair  Recall:  Good  Fund of Knowledge:  Good  Language:  Good  Akathisia:  No  Handed:  Right  AIMS (if indicated):   N/A  Assets:  Desire for Improvement  ADL's:  Intact  Cognition:  WNL  Sleep:   N/A     Treatment Plan Summary: Daily contact with patient to assess and evaluate symptoms and progress in treatment  Disposition: Recommend psychiatric Inpatient admission when medically cleared.  This service was provided via telemedicine using a 2-way, interactive audio and video technology.  Names of all persons participating in this telemedicine service and their role in this encounter. Name: [name] Patient  Letitia Libra FNP  J. Mariea Clonts MD    Emmaline Kluver, [place] 06/14/2019 2:12 PM   Patient seen by telemedicine for psychiatric evaluation, chart reviewed and case discussed with the physician extender and developed treatment plan. Reviewed the information documented and agree with the treatment plan.  Buford Dresser, DO 06/15/19 9:45 AM

## 2019-06-15 ENCOUNTER — Inpatient Hospital Stay (HOSPITAL_COMMUNITY)
Admission: AD | Admit: 2019-06-15 | Discharge: 2019-06-19 | DRG: 885 | Disposition: A | Discharge status: Other Acute Inpatient Hospital | Payer: Medicare Other | Source: Intra-hospital | Attending: Psychiatry | Admitting: Psychiatry

## 2019-06-15 ENCOUNTER — Other Ambulatory Visit: Payer: Self-pay

## 2019-06-15 ENCOUNTER — Encounter (HOSPITAL_COMMUNITY): Payer: Self-pay

## 2019-06-15 DIAGNOSIS — F1420 Cocaine dependence, uncomplicated: Secondary | ICD-10-CM | POA: Diagnosis present

## 2019-06-15 DIAGNOSIS — I10 Essential (primary) hypertension: Secondary | ICD-10-CM | POA: Diagnosis present

## 2019-06-15 DIAGNOSIS — G4700 Insomnia, unspecified: Secondary | ICD-10-CM | POA: Diagnosis present

## 2019-06-15 DIAGNOSIS — M94269 Chondromalacia, unspecified knee: Secondary | ICD-10-CM | POA: Diagnosis present

## 2019-06-15 DIAGNOSIS — I25119 Atherosclerotic heart disease of native coronary artery with unspecified angina pectoris: Secondary | ICD-10-CM | POA: Diagnosis present

## 2019-06-15 DIAGNOSIS — B20 Human immunodeficiency virus [HIV] disease: Secondary | ICD-10-CM | POA: Diagnosis present

## 2019-06-15 DIAGNOSIS — E1151 Type 2 diabetes mellitus with diabetic peripheral angiopathy without gangrene: Secondary | ICD-10-CM | POA: Diagnosis present

## 2019-06-15 DIAGNOSIS — Z20828 Contact with and (suspected) exposure to other viral communicable diseases: Secondary | ICD-10-CM | POA: Diagnosis present

## 2019-06-15 DIAGNOSIS — R45851 Suicidal ideations: Secondary | ICD-10-CM | POA: Diagnosis present

## 2019-06-15 DIAGNOSIS — I4891 Unspecified atrial fibrillation: Secondary | ICD-10-CM | POA: Diagnosis present

## 2019-06-15 DIAGNOSIS — F333 Major depressive disorder, recurrent, severe with psychotic symptoms: Secondary | ICD-10-CM | POA: Diagnosis not present

## 2019-06-15 DIAGNOSIS — K5900 Constipation, unspecified: Secondary | ICD-10-CM | POA: Diagnosis present

## 2019-06-15 DIAGNOSIS — R440 Auditory hallucinations: Secondary | ICD-10-CM | POA: Diagnosis not present

## 2019-06-15 DIAGNOSIS — M1000 Idiopathic gout, unspecified site: Secondary | ICD-10-CM | POA: Diagnosis present

## 2019-06-15 DIAGNOSIS — F332 Major depressive disorder, recurrent severe without psychotic features: Principal | ICD-10-CM | POA: Diagnosis present

## 2019-06-15 DIAGNOSIS — N289 Disorder of kidney and ureter, unspecified: Secondary | ICD-10-CM | POA: Diagnosis present

## 2019-06-15 DIAGNOSIS — Z86711 Personal history of pulmonary embolism: Secondary | ICD-10-CM

## 2019-06-15 DIAGNOSIS — Z590 Homelessness: Secondary | ICD-10-CM | POA: Diagnosis not present

## 2019-06-15 DIAGNOSIS — K219 Gastro-esophageal reflux disease without esophagitis: Secondary | ICD-10-CM | POA: Diagnosis present

## 2019-06-15 LAB — GLUCOSE, CAPILLARY
Glucose-Capillary: 148 mg/dL — ABNORMAL HIGH (ref 70–99)
Glucose-Capillary: 187 mg/dL — ABNORMAL HIGH (ref 70–99)

## 2019-06-15 IMAGING — DX DG CHEST 2V
2 series · 2 of 2 positions shown · non-contrast
Comparison: Most recent chest radiograph 05/03/2018, most recent
chest CT 04/11/2018

CLINICAL DATA: Chest pain and shortness of breath.

EXAM:
CHEST - 2 VIEW

[w chest pa]
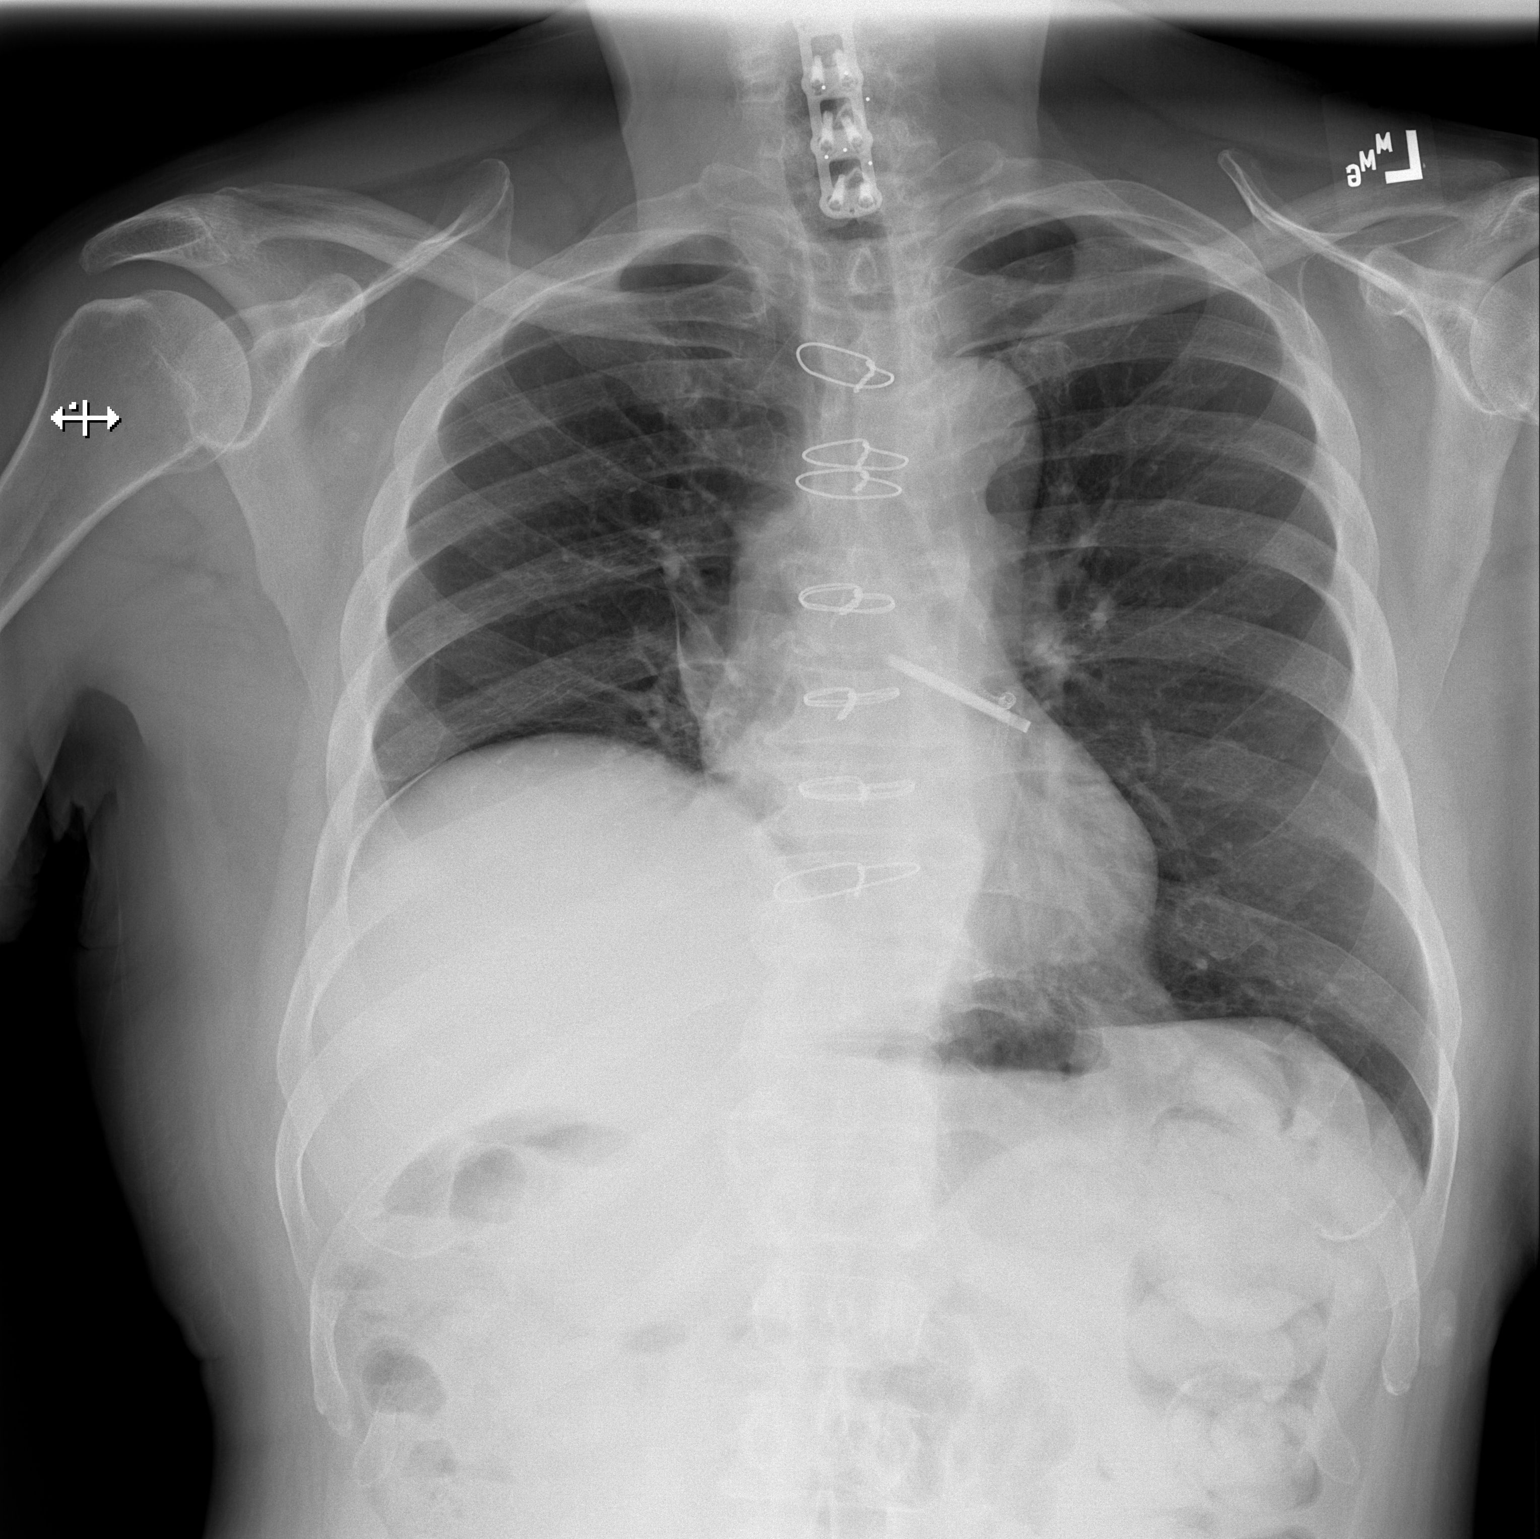

[w chest lat]
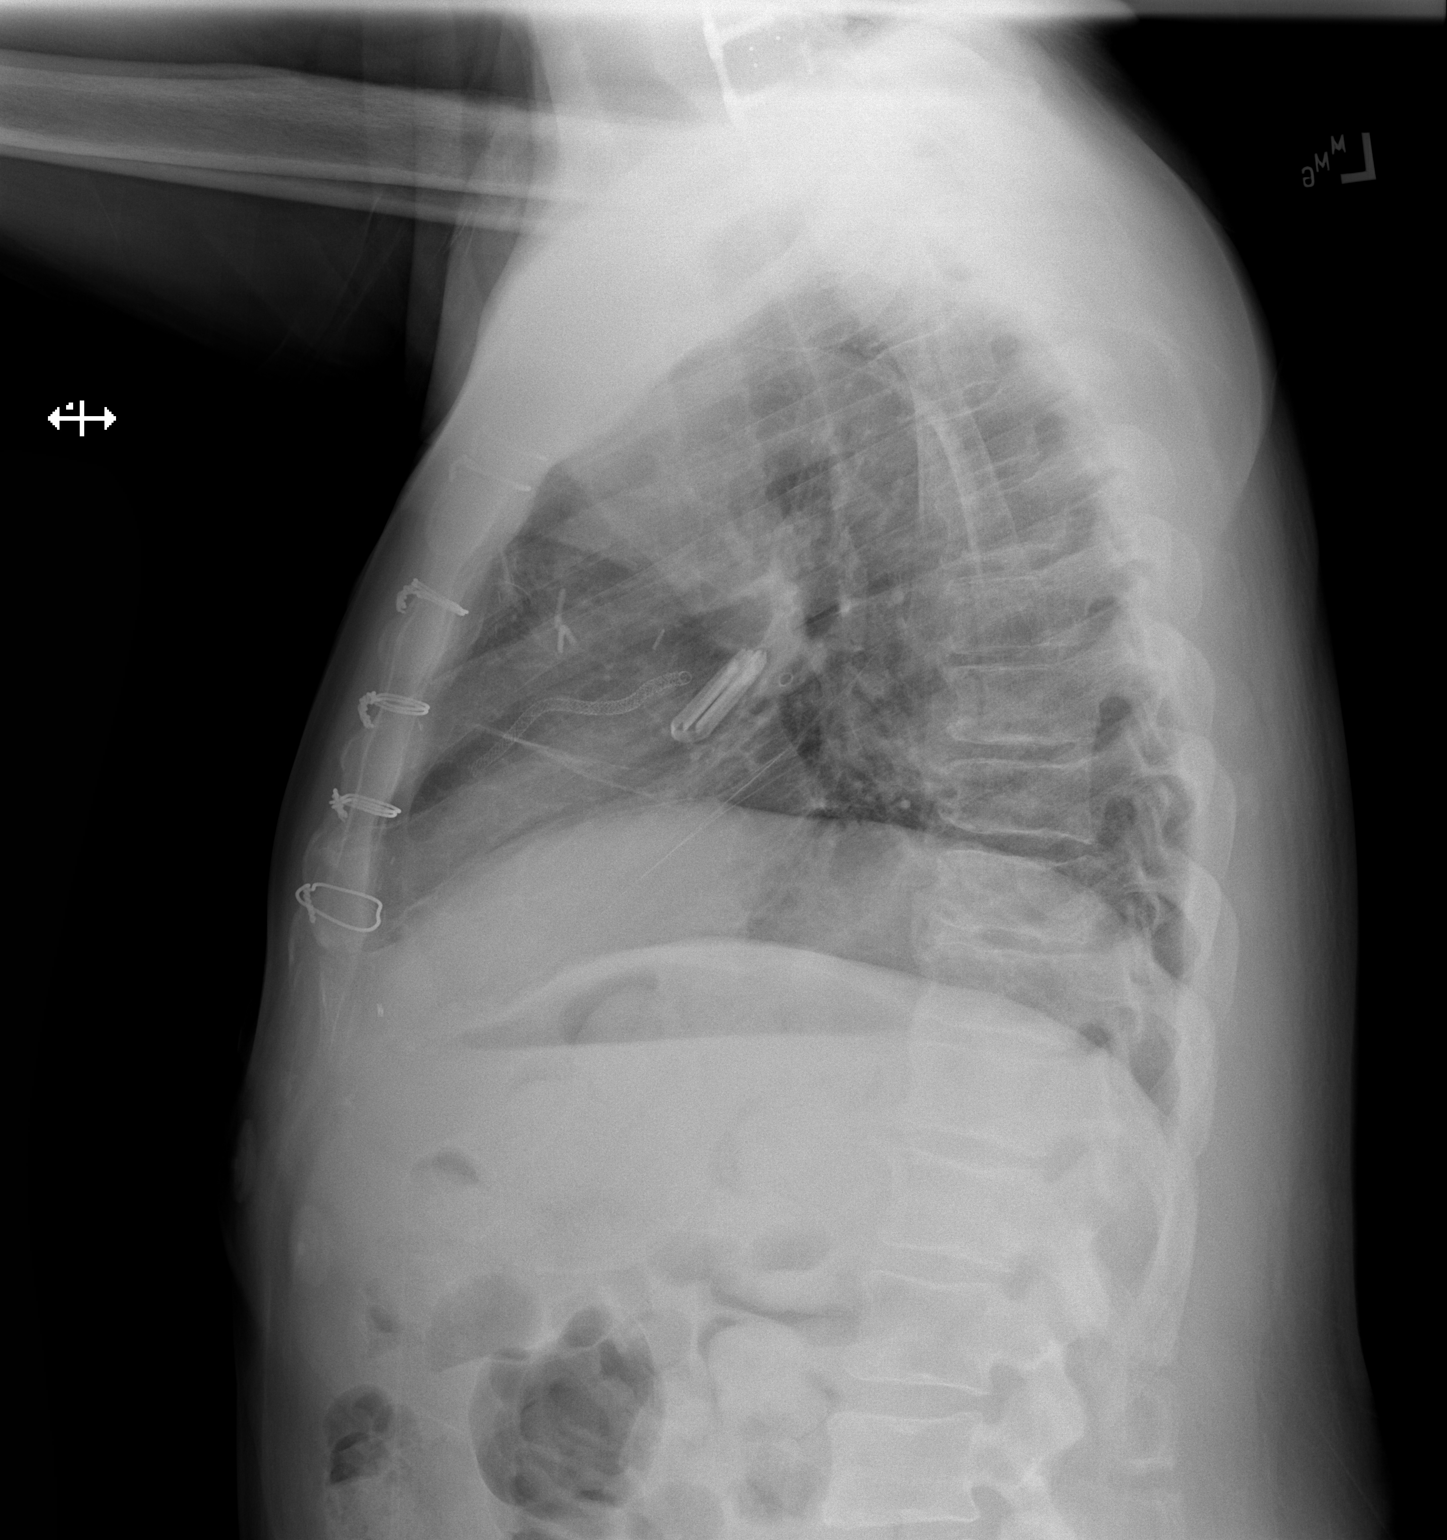

[2 of 2 positions shown; findings below may reference images not displayed]

FINDINGS: Post median sternotomy. Left atrial clipping and coronary stent
visualized. Unchanged heart size and mediastinal contours. No
pulmonary edema, pleural effusion, pneumothorax or focal airspace
disease. Again seen elevation of right hemidiaphragm.
IMPRESSION: No acute abnormality.  Stable postsurgical change.

## 2019-06-15 MED ORDER — ALUM & MAG HYDROXIDE-SIMETH 200-200-20 MG/5ML PO SUSP: 30.0000 mL | ORAL | Status: DC | PRN

## 2019-06-15 MED ORDER — METFORMIN HCL 500 MG PO TABS
1000.0000 mg | ORAL_TABLET | Freq: Two times a day (BID) | ORAL | Status: DC
  Administered 2019-06-15 – 2019-06-16 (×3): 1000 mg via ORAL
  Filled 2019-06-15 (×7): qty 2

## 2019-06-15 MED ORDER — DILTIAZEM HCL ER COATED BEADS 120 MG PO CP24
120.0000 mg | ORAL_CAPSULE | Freq: Every day | ORAL | Status: DC
  Administered 2019-06-15 – 2019-06-19 (×5): 120 mg via ORAL
  Filled 2019-06-15 (×8): qty 1

## 2019-06-15 MED ORDER — ACETAMINOPHEN 325 MG PO TABS: 650.0000 mg | ORAL_TABLET | ORAL | Status: DC | PRN

## 2019-06-15 MED ORDER — INSULIN ASPART 100 UNIT/ML ~~LOC~~ SOLN
0.0000 [IU] | Freq: Three times a day (TID) | SUBCUTANEOUS | Status: DC
  Administered 2019-06-15: 2 [IU] via SUBCUTANEOUS
  Administered 2019-06-16: 5 [IU] via SUBCUTANEOUS
  Administered 2019-06-16: 17:00:00 2 [IU] via SUBCUTANEOUS
  Administered 2019-06-17: 17:00:00 5 [IU] via SUBCUTANEOUS
  Administered 2019-06-17: 2 [IU] via SUBCUTANEOUS
  Administered 2019-06-18: 3 [IU] via SUBCUTANEOUS
  Administered 2019-06-19: 2 [IU] via SUBCUTANEOUS

## 2019-06-15 MED ORDER — TRAZODONE HCL 100 MG PO TABS
100.0000 mg | ORAL_TABLET | Freq: Every evening | ORAL | Status: DC | PRN
  Administered 2019-06-15 – 2019-06-18 (×4): 100 mg via ORAL
  Filled 2019-06-15 (×4): qty 1

## 2019-06-15 MED ORDER — NITROGLYCERIN 0.4 MG SL SUBL: 0.4000 mg | SUBLINGUAL_TABLET | SUBLINGUAL | Status: DC | PRN

## 2019-06-15 MED ORDER — HYDROXYZINE HCL 25 MG PO TABS: 25.0000 mg | ORAL_TABLET | Freq: Three times a day (TID) | ORAL | Status: DC | PRN

## 2019-06-15 MED ORDER — ALLOPURINOL 300 MG PO TABS
300.0000 mg | ORAL_TABLET | Freq: Every day | ORAL | Status: DC
  Administered 2019-06-16 – 2019-06-19 (×4): 300 mg via ORAL
  Filled 2019-06-15 (×6): qty 1

## 2019-06-15 MED ORDER — ASPIRIN 81 MG PO CHEW
81.0000 mg | CHEWABLE_TABLET | Freq: Every day | ORAL | Status: DC
  Administered 2019-06-15 – 2019-06-19 (×5): 81 mg via ORAL
  Filled 2019-06-15 (×8): qty 1

## 2019-06-15 MED ORDER — GABAPENTIN 400 MG PO CAPS
400.0000 mg | ORAL_CAPSULE | Freq: Two times a day (BID) | ORAL | Status: DC
  Administered 2019-06-15 – 2019-06-19 (×8): 400 mg via ORAL
  Filled 2019-06-15 (×13): qty 1

## 2019-06-15 MED ORDER — EMTRICITAB-RILPIVIR-TENOFOV AF 200-25-25 MG PO TABS
1.0000 | ORAL_TABLET | Freq: Every day | ORAL | Status: DC
  Administered 2019-06-16 – 2019-06-19 (×4): 1 via ORAL
  Filled 2019-06-15 (×6): qty 1

## 2019-06-15 MED ORDER — MAGNESIUM HYDROXIDE 400 MG/5ML PO SUSP: 30.0000 mL | Freq: Every day | ORAL | Status: DC | PRN

## 2019-06-15 MED ORDER — HYDROXYZINE HCL 25 MG PO TABS
25.0000 mg | ORAL_TABLET | Freq: Three times a day (TID) | ORAL | Status: DC | PRN
  Administered 2019-06-16: 25 mg via ORAL
  Filled 2019-06-15: qty 1

## 2019-06-15 MED ORDER — INSULIN GLARGINE 100 UNIT/ML ~~LOC~~ SOLN
20.0000 [IU] | Freq: Every day | SUBCUTANEOUS | Status: DC
  Administered 2019-06-15 – 2019-06-18 (×4): 20 [IU] via SUBCUTANEOUS

## 2019-06-15 MED ORDER — FLUTICASONE PROPIONATE HFA 44 MCG/ACT IN AERO
1.0000 | INHALATION_SPRAY | Freq: Two times a day (BID) | RESPIRATORY_TRACT | Status: DC
  Administered 2019-06-15 – 2019-06-19 (×8): 1 via RESPIRATORY_TRACT
  Filled 2019-06-15 (×3): qty 10.6

## 2019-06-15 MED ORDER — ACETAMINOPHEN 325 MG PO TABS
650.0000 mg | ORAL_TABLET | Freq: Four times a day (QID) | ORAL | Status: DC | PRN
  Administered 2019-06-17: 650 mg via ORAL
  Filled 2019-06-15: qty 2

## 2019-06-15 MED ORDER — ALBUTEROL SULFATE HFA 108 (90 BASE) MCG/ACT IN AERS
1.0000 | INHALATION_SPRAY | Freq: Four times a day (QID) | RESPIRATORY_TRACT | Status: DC | PRN
  Administered 2019-06-18: 1 via RESPIRATORY_TRACT
  Filled 2019-06-15 (×2): qty 6.7

## 2019-06-15 MED ORDER — POLYETHYLENE GLYCOL 3350 17 G PO PACK: 17.0000 g | PACK | Freq: Every day | ORAL | Status: DC | PRN

## 2019-06-15 MED ORDER — FAMOTIDINE 20 MG PO TABS
20.0000 mg | ORAL_TABLET | Freq: Two times a day (BID) | ORAL | Status: DC
  Administered 2019-06-15 – 2019-06-19 (×8): 20 mg via ORAL
  Filled 2019-06-15 (×14): qty 1

## 2019-06-15 MED ORDER — TRAZODONE HCL 50 MG PO TABS: 50.0000 mg | ORAL_TABLET | Freq: Every evening | ORAL | Status: DC | PRN

## 2019-06-15 MED ORDER — SERTRALINE HCL 50 MG PO TABS
50.0000 mg | ORAL_TABLET | Freq: Every day | ORAL | Status: DC
  Administered 2019-06-16 – 2019-06-19 (×4): 50 mg via ORAL
  Filled 2019-06-15 (×6): qty 1

## 2019-06-15 NOTE — Progress Notes (Signed)
Psychoeducational Group Note  Date:  06/15/2019 Time:  2047  Group Topic/Focus:  Wrap-Up Group:   The focus of this group is to help patients review their daily goal of treatment and discuss progress on daily workbooks.  Participation Level: Did Not Attend  Participation Quality:  Not Applicable  Affect:  Not Applicable  Cognitive:  Not Applicable  Insight:  Not Applicable  Engagement in Group: Not Applicable  Additional Comments:  The patient did not attend group this evening.   Archie Balboa S 06/15/2019, 8:47 PM

## 2019-06-15 NOTE — H&P (Signed)
Psychiatric Admission Assessment Adult  Patient Identification: Michael [name] MRN:  [id number] Date of Evaluation:  06/15/2019 Chief Complaint:  MDD recurrent without psychotic features Cocaine Use Disorder Principal Diagnosis: <principal problem not specified> Diagnosis:  Active Problems:   MDD (major depressive disorder), recurrent episode, severe ([place])  History of Present Illness: Patient is seen and examined.  Patient is a 68 year old male with a past psychiatric history significant for unspecified depression and cocaine dependence who presented to the [facility] emergency department on 06/13/2019 with suicidal ideation.  The patient stated that he had been using cocaine since he was age 12.  He stated that he had become recently very depressed because the fact that his family could not provide him with housing or other needed necessities of daily living.  He stated that he was currently homeless, but with the above-stated issues "had no hope left".  Patient had previous psychiatric admissions in December 2019 at the behavioral health hospital as well as [place] and old [place] for similar presentations.  He had been attempting to be followed at an out reach clinic, but review of the electronic medical record revealed multiple attempts to contact with him without success.  He stated he has been taking his HIV medicines on and off.  These prescriptions have been refilled.  He denied any other drugs or alcohol.  He has significant past medical history for renal disease as well as coronary artery disease.  He underwent a bypass graft surgery on his heart in 2019.  He admitted to helplessness, hopelessness and worthlessness.  He was admitted to the hospital for evaluation and stabilization.  Associated Signs/Symptoms: Depression Symptoms:  depressed mood, anhedonia, insomnia, psychomotor retardation, fatigue, feelings of worthlessness/guilt, difficulty  concentrating, hopelessness, suicidal thoughts without plan, anxiety, loss of energy/fatigue, disturbed sleep, weight loss, (Hypo) Manic Symptoms:  Hallucinations, Impulsivity, Anxiety Symptoms:  Excessive Worry, Psychotic Symptoms:  Hallucinations: Auditory PTSD Symptoms: Negative Total Time spent with patient: 30 minutes  Past Psychiatric History: Patient has several psychiatric hospitalizations.  Apparently his last psychiatric hospitalization was in 2019.  He is been hospitalized at our facility as well as old [place] and [place] [facility].  Is the patient at risk to self? Yes.    Has the patient been a risk to self in the past 6 months? Yes.    Has the patient been a risk to self within the distant past? No.  Is the patient a risk to others? No.  Has the patient been a risk to others in the past 6 months? No.  Has the patient been a risk to others within the distant past? No.   Prior Inpatient Therapy:   Prior Outpatient Therapy:    Alcohol Screening: 1. How often do you have a drink containing alcohol?: Never 2. How many drinks containing alcohol do you have on a typical day when you are drinking?: 1 or 2 3. How often do you have six or more drinks on one occasion?: Never AUDIT-C Score: 0 4. How often during the last year have you found that you were not able to stop drinking once you had started?: Never 5. How often during the last year have you failed to do what was normally expected from you becasue of drinking?: Never 6. How often during the last year have you needed a first drink in the morning to get yourself going after a heavy drinking session?: Never 7. How often during the last year have you had a  feeling of guilt of remorse after drinking?: Never 8. How often during the last year have you been unable to remember what happened the night before because you had been drinking?: Never 9. Have you or someone else been injured as a result of your  drinking?: No 10. Has a relative or friend or a doctor or another health worker been concerned about your drinking or suggested you cut down?: No Alcohol Use Disorder Identification Test Final Score (AUDIT): 0 Substance Abuse History in the last 12 months:  Yes.   Consequences of Substance Abuse: Medical Consequences:  : Clearly the cocaine is contributed to his heart disease as well as his kidney disease. Family Consequences:  As stated above, he was attempting to reconnect with family, and even he admits that the cocaine issues are blocking that. Previous Psychotropic Medications: Yes  Psychological Evaluations: Yes  Past Medical History:  Past Medical History:  Diagnosis Date  . A-fib ([place])   . Asthma    No PFTs, history of childhood asthma  . CAD (coronary artery disease)    a. 06/2013 STEMI/PCI ([facility]): LAD w/ thrombus (treated with BMS), mid 75%, D2 75%; LCX OM2 75%; RCA small, PDA 95%, PLV 95%;  b. 10/2013 Cath/PCI: ISR w/in LAD (Promus DES x 2), borderline OM2 lesion;  c. 01/2014 MV: Intermediate risk, medium-sized distal ant wall infarct w/ very small amt of peri-infarct ischemia. EF 60%.  . Cellulitis 04/2014   left facial  . Chondromalacia of medial femoral condyle    Left knee MRI 04/28/12: Chondromalacia of the medial femoral condyle with slight peripheral degeneration of the meniscocapsular junction of the medial meniscus; followed by sports medicine  . Collagen vascular disease ([place])   . Crack cocaine use    for 20+ years, has been enrolled in detox programs in the past  . Depression    with history of hospitalization for suicidal ideation  . Diabetes mellitus 2002   Diagnosed in 2002, started insulin in 2012  . Gout   . Gout 04/28/2012  . Headache(784.0)    CT head 08/2011: Periventricular and subcortical white matter hypodensities are most in keeping with chronic microangiopathic change  . HIV infection [facility]) Nov 2012   Followed by [name]  . Hyperlipidemia   .  Hypertension   . Pulmonary embolism [facility])     Past Surgical History:  Procedure Laterality Date  . BACK SURGERY     1988  . BOWEL RESECTION    . CARDIAC SURGERY    . CERVICAL SPINE SURGERY     " rods in my neck "  . CORONARY ARTERY BYPASS GRAFT    . CORONARY STENT PLACEMENT    . NM MYOCAR PERF WALL MOTION  12/27/2011   normal  . SPINE SURGERY     Family History:  Family History  Problem Relation Age of Onset  . Diabetes Mother   . Hypertension Mother   . Hyperlipidemia Mother   . Diabetes Father   . Cancer Father   . Hypertension Father   . Diabetes Brother   . Heart disease Brother   . Diabetes Sister   . Colon cancer Neg Hx    Family Psychiatric  History: Noncontributory Tobacco Screening:   Social History:  Social History   Substance and Sexual Activity  Alcohol Use No  . Alcohol/week: 4.0 standard drinks  . Types: 2 Cans of beer, 2 Shots of liquor per week     Social History   Substance and Sexual  Activity  Drug Use Yes  . Types: "Crack" cocaine, Cocaine    Additional Social History:                           Allergies:  No Known Allergies Lab Results:  Results for orders placed or performed during the hospital encounter of 06/11/19 (from the past 48 hour(s))  CBG monitoring, ED     Status: Abnormal   Collection Time: 06/13/19  4:15 PM  Result Value Ref Range   Glucose-Capillary 133 (H) 70 - 99 mg/dL    Blood Alcohol level:  Lab Results  Component Value Date   ETH 17 (H) 06/11/2019   ETH <10 84/69/6295    Metabolic Disorder Labs:  Lab Results  Component Value Date   HGBA1C 13.2 (H) 04/15/2019   MPG 332 04/15/2019   MPG 314.92 03/19/2019   No results found for: PROLACTIN Lab Results  Component Value Date   CHOL 187 05/04/2018   TRIG 166 (H) 05/04/2018   HDL 44 05/04/2018   CHOLHDL 4.3 05/04/2018   VLDL 33 05/04/2018   LDLCALC 110 (H) 05/04/2018   LDLCALC 103 (H) 10/14/2017    Current Medications: Current  Facility-Administered Medications  Medication Dose Route Frequency Provider Last Rate Last Dose  . acetaminophen (TYLENOL) tablet 650 mg  650 mg Oral Q6H PRN Sharma Covert, MD      . albuterol (VENTOLIN HFA) 108 (90 Base) MCG/ACT inhaler 1-2 puff  1-2 puff Inhalation Q6H PRN Sharma Covert, MD      . Derrill Memo ON 06/16/2019] allopurinol (ZYLOPRIM) tablet 300 mg  300 mg Oral Daily Sharma Covert, MD      . alum & mag hydroxide-simeth (MAALOX/MYLANTA) 200-200-20 MG/5ML suspension 30 mL  30 mL Oral Q4H PRN Sharma Covert, MD      . diltiazem (CARDIZEM CD) 24 hr capsule 120 mg  120 mg Oral Daily Sharma Covert, MD      . Derrill Memo ON 06/16/2019] emtricitabine-rilpivir-tenofovir AF (ODEFSEY) 200-25-25 MG per tablet 1 tablet  1 tablet Oral Q breakfast Mallie Darting Cordie Grice, MD      . famotidine (PEPCID) tablet 20 mg  20 mg Oral BID Sharma Covert, MD      . fluticasone (FLOVENT HFA) 44 MCG/ACT inhaler 1 puff  1 puff Inhalation BID Sharma Covert, MD      . gabapentin (NEURONTIN) capsule 400 mg  400 mg Oral BID Sharma Covert, MD      . hydrOXYzine (ATARAX/VISTARIL) tablet 25 mg  25 mg Oral TID PRN Sharma Covert, MD      . insulin aspart (novoLOG) injection 0-15 Units  0-15 Units Subcutaneous TID WC Sharma Covert, MD      . insulin glargine (LANTUS) injection 20 Units  20 Units Subcutaneous QHS Sharma Covert, MD      . magnesium hydroxide (MILK OF MAGNESIA) suspension 30 mL  30 mL Oral Daily PRN Sharma Covert, MD      . metFORMIN (GLUCOPHAGE) tablet 1,000 mg  1,000 mg Oral BID WC Sharma Covert, MD      . nitroGLYCERIN (NITROSTAT) SL tablet 0.4 mg  0.4 mg Sublingual Q5 min PRN Sharma Covert, MD      . polyethylene glycol (MIRALAX / GLYCOLAX) packet 17 g  17 g Oral Daily PRN Sharma Covert, MD      . Derrill Memo ON 06/16/2019] sertraline (ZOLOFT) tablet 50 mg  50 mg Oral Daily Sharma Covert, MD      . traZODone (DESYREL) tablet 100 mg  100 mg Oral QHS PRN  Sharma Covert, MD       PTA Medications: No medications prior to admission.    Musculoskeletal: Strength & Muscle Tone: within normal limits Gait & Station: shuffle Patient leans: N/A  Psychiatric Specialty Exam: Physical Exam  Nursing note and vitals reviewed. Constitutional: He is oriented to person, place, and time. He appears well-developed and well-nourished.  HENT:  Head: Normocephalic and atraumatic.  Respiratory: Effort normal.  Neurological: He is alert and oriented to person, place, and time.    ROS  Blood pressure (!) 145/94, pulse (!) 101, temperature 98 F (36.7 C), temperature source Oral, resp. rate 18, height 5\' 8"  (1.727 m), weight 74.4 kg, SpO2 100 %.Body mass index is 24.94 kg/m.  General Appearance: Disheveled  Eye Contact:  Minimal  Speech:  Normal Rate  Volume:  Decreased  Mood:  Depressed  Affect:  Congruent  Thought Process:  Coherent and Descriptions of Associations: Intact  Orientation:  Full (Time, Place, and Person)  Thought Content:  Logical  Suicidal Thoughts:  Yes.  without intent/plan  Homicidal Thoughts:  No  Memory:  Immediate;   Fair Recent;   Fair Remote;   Fair  Judgement:  Impaired  Insight:  Lacking  Psychomotor Activity:  Psychomotor Retardation  Concentration:  Concentration: Fair and Attention Span: Fair  Recall:  [employer] of Knowledge:  Fair  Language:  Fair  Akathisia:  Negative  Handed:  Right  AIMS (if indicated):     Assets:  Desire for Improvement Resilience  ADL's:  Intact  Cognition:  WNL  Sleep:       Treatment Plan Summary: Daily contact with patient to assess and evaluate symptoms and progress in treatment, Medication management and Plan : Patient is seen and examined.  Patient is a 68 year old male with the above past medical and psychiatric history was admitted secondary depression and suicidal ideation.  He will be admitted to the hospital.  He will be integrated into the milieu.  He will be  encouraged to attend groups.  It appears from his medication list that he was started on Zoloft while waiting for admission.  That will be continued at 50 mg p.o. daily.  He has significant coronary artery disease.  His blood pressure is elevated.  He was previously treated with Cardizem, and that will be restarted at Cardizem CD 120 mg p.o. daily.  Given his cocaine history beta-blockers are contraindicated.  As well it appears his kidney function has worsened since last time he was in the hospital.  This may be due to dehydration, worsening kidney disease secondary to the cocaine or poorly controlled diabetes or poorly controlled hypertension.  He does have a history of gout, and his allopurinol will be restarted at the dosage she was on for his most recent hospitalization.  He also has a history of diabetes, and was discharged on Lantus insulin as well as metformin on his most recent hospitalization.  His renal function has worsened, and we will have to monitor that given the metformin.  Given his current social situation we will continue the Metformin at thousand milligrams p.o. twice daily, and I have restarted his Lantus insulin at 20 units subcu nightly.  He will also be on a sliding scale, and will follow his blood sugars 4 times daily.  He does have chronic pain and peripheral neuropathy  issues, and is currently on Neurontin.  We will restart that at 400 mg p.o. twice daily.  That dosage may need to be changed to 300 mg 3 times daily given his kidney function.  That will have to be monitored.  He does state that he has been taking his HIV medicines, and his Vernell Leep will be continued.  He does have some reflux disease, but because of his HIV medicine he is unable to take proton pump inhibitors.  We will give him Pepcid 20 mg p.o. twice daily.  Hopefully we can get him back connected with the out reach clinic, and residential treatment if at all possible.  Observation Level/Precautions:  Detox 15 minute  checks  Laboratory:  Chemistry Profile  Psychotherapy:    Medications:    Consultations:    Discharge Concerns:    Estimated LOS:  Other:     Physician Treatment Plan for Primary Diagnosis: <principal problem not specified> Long Term Goal(s): Improvement in symptoms so as ready for discharge  Short Term Goals: Ability to identify changes in lifestyle to reduce recurrence of condition will improve, Ability to verbalize feelings will improve, Ability to disclose and discuss suicidal ideas, Ability to demonstrate self-control will improve, Ability to identify and develop effective coping behaviors will improve, Ability to maintain clinical measurements within normal limits will improve, Compliance with prescribed medications will improve and Ability to identify triggers associated with substance abuse/mental health issues will improve  Physician Treatment Plan for Secondary Diagnosis: Active Problems:   MDD (major depressive disorder), recurrent episode, severe ([place])  Long Term Goal(s): Improvement in symptoms so as ready for discharge  Short Term Goals: Ability to identify changes in lifestyle to reduce recurrence of condition will improve, Ability to verbalize feelings will improve, Ability to disclose and discuss suicidal ideas, Ability to demonstrate self-control will improve, Ability to identify and develop effective coping behaviors will improve, Ability to maintain clinical measurements within normal limits will improve, Compliance with prescribed medications will improve and Ability to identify triggers associated with substance abuse/mental health issues will improve  I certify that inpatient services furnished can reasonably be expected to improve the patient's condition.    Sharma Covert, MD 9/8/20203:59 PM

## 2019-06-15 NOTE — BHH Suicide Risk Assessment (Signed)
[name] Admission Suicide Risk Assessment   Nursing information obtained from:  Patient Demographic factors:  Male, Living alone, Age 68 or older, Divorced or widowed Current Mental Status:  Suicidal ideation indicated by patient, Suicide plan, Self-harm thoughts, Belief that plan would result in death Loss Factors:  NA Historical Factors:  Impulsivity, Prior suicide attempts Risk Reduction Factors:  Religious beliefs about death  Total Time spent with patient: 30 minutes Principal Problem: <principal problem not specified> Diagnosis:  Active Problems:   MDD (major depressive disorder), recurrent episode, severe ([place])  Subjective Data: Patient is seen and examined.  Patient is a 68 year old male with a past psychiatric history significant for unspecified depression and cocaine dependence who presented to the [facility] emergency department on 06/13/2019 with suicidal ideation.  The patient stated that he had been using cocaine since he was age 54.  He stated that he had become recently very depressed because the fact that his family could not provide him with housing or other needed necessities of daily living.  He stated that he was currently homeless, but with the above-stated issues "had no hope left".  Patient had previous psychiatric admissions in December 2019 at the behavioral health hospital as well as [place] and old [place] for similar presentations.  He had been attempting to be followed at an out reach clinic, but review of the electronic medical record revealed multiple attempts to contact with him without success.  He stated he has been taking his HIV medicines on and off.  These prescriptions have been refilled.  He denied any other drugs or alcohol.  He has significant past medical history for renal disease as well as coronary artery disease.  He underwent a bypass graft surgery on his heart in 2019.  He admitted to helplessness, hopelessness and worthlessness.   He was admitted to the hospital for evaluation and stabilization.  Continued Clinical Symptoms:  Alcohol Use Disorder Identification Test Final Score (AUDIT): 0 The "Alcohol Use Disorders Identification Test", Guidelines for Use in Primary Care, Second Edition.  World Pharmacologist [facility]). Score between 0-7:  no or low risk or alcohol related problems. Score between 8-15:  moderate risk of alcohol related problems. Score between 16-19:  high risk of alcohol related problems. Score 20 or above:  warrants further diagnostic evaluation for alcohol dependence and treatment.   CLINICAL FACTORS:   Depression:   Anhedonia Comorbid alcohol abuse/dependence Hopelessness Impulsivity Insomnia Alcohol/Substance Abuse/Dependencies   Musculoskeletal: Strength & Muscle Tone: within normal limits Gait & Station: shuffle Patient leans: N/A  Psychiatric Specialty Exam: Physical Exam  Nursing note and vitals reviewed. Constitutional: He is oriented to person, place, and time. He appears well-developed and well-nourished.  HENT:  Head: Normocephalic and atraumatic.  Respiratory: Effort normal.  Neurological: He is alert and oriented to person, place, and time.    ROS  Blood pressure (!) 145/94, pulse (!) 101, temperature 98 F (36.7 C), temperature source Oral, resp. rate 18, height 5\' 8"  (1.727 m), weight 74.4 kg, SpO2 100 %.Body mass index is 24.94 kg/m.  General Appearance: Disheveled  Eye Contact:  Poor  Speech:  Normal Rate  Volume:  Decreased  Mood:  Depressed  Affect:  Congruent  Thought Process:  Coherent and Descriptions of Associations: Intact  Orientation:  Full (Time, Place, and Person)  Thought Content:  Logical  Suicidal Thoughts:  Yes.  without intent/plan  Homicidal Thoughts:  No  Memory:  Immediate;   Fair Recent;   Fair Remote;  Fair  Judgement:  Impaired  Insight:  Lacking  Psychomotor Activity:  Psychomotor Retardation  Concentration:  Concentration: Fair  and Attention Span: Fair  Recall:  [employer] of Knowledge:  Fair  Language:  Fair  Akathisia:  Negative  Handed:  Right  AIMS (if indicated):     Assets:  Desire for Improvement Resilience  ADL's:  Intact  Cognition:  WNL  Sleep:         COGNITIVE FEATURES THAT CONTRIBUTE TO RISK:  None    SUICIDE RISK:   Mild:  Suicidal ideation of limited frequency, intensity, duration, and specificity.  There are no identifiable plans, no associated intent, mild dysphoria and related symptoms, good self-control (both objective and subjective assessment), few other risk factors, and identifiable protective factors, including available and accessible social support.  PLAN OF CARE: Patient is seen and examined.  Patient is a 68 year old male with the above past medical and psychiatric history was admitted secondary depression and suicidal ideation.  He will be admitted to the hospital.  He will be integrated into the milieu.  He will be encouraged to attend groups.  It appears from his medication list that he was started on Zoloft while waiting for admission.  That will be continued at 50 mg p.o. daily.  He has significant coronary artery disease.  His blood pressure is elevated.  He was previously treated with Cardizem, and that will be restarted at Cardizem CD 120 mg p.o. daily.  Given his cocaine history beta-blockers are contraindicated.  As well it appears his kidney function has worsened since last time he was in the hospital.  This may be due to dehydration, worsening kidney disease secondary to the cocaine or poorly controlled diabetes or poorly controlled hypertension.  He does have a history of gout, and his allopurinol will be restarted at the dosage she was on for his most recent hospitalization.  He also has a history of diabetes, and was discharged on Lantus insulin as well as metformin on his most recent hospitalization.  His renal function has worsened, and we will have to monitor that given  the metformin.  Given his current social situation we will continue the Metformin at thousand milligrams p.o. twice daily, and I have restarted his Lantus insulin at 20 units subcu nightly.  He will also be on a sliding scale, and will follow his blood sugars 4 times daily.  He does have chronic pain and peripheral neuropathy issues, and is currently on Neurontin.  We will restart that at 400 mg p.o. twice daily.  That dosage may need to be changed to 300 mg 3 times daily given his kidney function.  That will have to be monitored.  He does state that he has been taking his HIV medicines, and his Vernell Leep will be continued.  He does have some reflux disease, but because of his HIV medicine he is unable to take proton pump inhibitors.  We will give him Pepcid 20 mg p.o. twice daily.  Hopefully we can get him back connected with the out reach clinic, and residential treatment if at all possible.  I certify that inpatient services furnished can reasonably be expected to improve the patient's condition.   Sharma Covert, MD 06/15/2019, 3:50 PM

## 2019-06-15 NOTE — Tx Team (Signed)
Initial Treatment Plan 06/15/2019 3:45 PM Michael [name] [id number]    PATIENT STRESSORS: Financial difficulties Health problems Medication change or noncompliance Substance abuse   PATIENT STRENGTHS: Average or above average intelligence Capable of independent living Financial means Supportive family/friends   PATIENT IDENTIFIED PROBLEMS: "depression'  "anxiety"  Substance abuse                 DISCHARGE CRITERIA:  Ability to meet basic life and health needs Adequate post-discharge living arrangements Improved stabilization in mood, thinking, and/or behavior Medical problems require only outpatient monitoring Motivation to continue treatment in a less acute level of care  PRELIMINARY DISCHARGE PLAN: Attend aftercare/continuing care group Return to previous living arrangement  PATIENT/FAMILY INVOLVEMENT: This treatment plan has been presented to and reviewed with the patient, Michael [name].  The patient and family have been given the opportunity to ask questions and make suggestions.  Baron Sane, RN 06/15/2019, 3:45 PM

## 2019-06-15 NOTE — BH Assessment (Signed)
[facility] Assessment Progress Note  Per Buford Dresser, DO, this pt requires psychiatric hospitalization at this time.  Heather, RN as assigned pt to [facility] Rm 300-2.  Pt has signed Voluntary Admission and Consent for Treatment, as well as Consent to Release Information to no one, and signed forms have been faxed to [facility].  Pt's nurse, Eustaquio Maize, has been notified, and agrees to send original paperwork along with pt via [facility] [place], and to call report to [phone].  Jalene Mullet, [place] Coordinator [phone]

## 2019-06-15 NOTE — ED Notes (Signed)
Pt off unit to [facility] per provider. Pt alert, calm, cooperative, no s/s of distress.  Pt voluntary.  DC information given to [place]ham for [facility]. Belongings given to pelham for [facility]. Pt ambulatory off the unit, escorted by NT, pt transported by [employer]

## 2019-06-15 NOTE — Progress Notes (Signed)
Patient ID: Michael [name], male   DOB: [birth year], 68 y.o.   MRN: [id number] Patient admitted to the unit due to increased depression and suicidal thoughts.  Patient reported suicidal ideations to overdose on crack cocaine or to walk into traffic.  Patient has had several suicide attempts in the past.  Skin assessment complete and patient was noted to have a healed surgical scar on the abdomen but no further injury. Patient belongings stored in locker and patient admitted to the unit without incident.

## 2019-06-15 NOTE — ED Notes (Signed)
Patient ate his breakfast

## 2019-06-15 NOTE — Consult Note (Addendum)
[facility] ED Psych Progress Note  Reason for Consult:  SI Referring Physician:  EDP Location of Patient:  Location of Provider: [place] Department  Patient Identification: Michael [name] MRN:  [id number] Principal Diagnosis: Severe recurrent major depressive disorder with psychotic features ([place]) Diagnosis:  Principal Problem:   Severe recurrent major depressive disorder with psychotic features ([place])   Total Time spent with patient: 15 minutes  Subjective:   Michael [name] is a 68 y.o. male patient admitted with depression and suicidal thoughts. Patient reports wanting to be discharged so that he can return to what he was doing before he came to the hospital. Patient endorses SI with plan and intent. Denies HI. Patient endorses auditory hallucinations, "voices telling me I'm no good." He endorses weight loss of 6-8 lbs in a month and that he is eating but has no appetite.      Past Psychiatric History: MDD, GAD, Cocaine Use Disorder  Risk to Self: Suicidal Ideation: Yes-Currently Present Suicidal Intent: Yes-Currently Present Is patient at risk for suicide?: Yes Suicidal Plan?: Yes-Currently Present Specify Current Suicidal Plan: OD on cocaine Access to Means: Yes Specify Access to Suicidal Means: Pt has drugs What has been your use of drugs/alcohol within the last 12 months?: Current use How many times?: (Multiple) Other Self Harm Risks: (Off medications) Triggers for Past Attempts: Unknown Intentional Self Injurious Behavior: None Risk to Others: Homicidal Ideation: No Thoughts of Harm to Others: No Current Homicidal Intent: No Current Homicidal Plan: No Access to Homicidal Means: No Identified Victim: NA History of harm to others?: No Assessment of Violence: None Noted Violent Behavior Description: NA Does patient have access to weapons?: No Criminal Charges Pending?: No Does patient have a court date: No Prior Inpatient Therapy: Prior Inpatient  Therapy: Yes Prior Therapy Dates: 2019, 2018 Prior Therapy Facilty/Provider(s): [facility], [facility] Reason for Treatment: MH issues Prior Outpatient Therapy: Prior Outpatient Therapy: Yes Prior Therapy Dates: 2019 Prior Therapy Facilty/Provider(s): [facility] Reason for Treatment: Med mang Does patient have an ACCT team?: No Does patient have Intensive In-House Services?  : No Does patient have [facility] services? : Yes Does patient have P4CC services?: No  Past Medical History:  Past Medical History:  Diagnosis Date  . A-fib ([place])   . Asthma    No PFTs, history of childhood asthma  . CAD (coronary artery disease)    a. 06/2013 STEMI/PCI ([facility]): LAD w/ thrombus (treated with BMS), mid 75%, D2 75%; LCX OM2 75%; RCA small, PDA 95%, PLV 95%;  b. 10/2013 Cath/PCI: ISR w/in LAD (Promus DES x 2), borderline OM2 lesion;  c. 01/2014 MV: Intermediate risk, medium-sized distal ant wall infarct w/ very small amt of peri-infarct ischemia. EF 60%.  . Cellulitis 04/2014   left facial  . Chondromalacia of medial femoral condyle    Left knee MRI 04/28/12: Chondromalacia of the medial femoral condyle with slight peripheral degeneration of the meniscocapsular junction of the medial meniscus; followed by sports medicine  . Collagen vascular disease ([place])   . Crack cocaine use    for 20+ years, has been enrolled in detox programs in the past  . Depression    with history of hospitalization for suicidal ideation  . Diabetes mellitus 2002   Diagnosed in 2002, started insulin in 2012  . Gout   . Gout 04/28/2012  . Headache(784.0)    CT head 08/2011: Periventricular and subcortical white matter hypodensities are most in keeping with chronic microangiopathic change  . HIV infection [facility]) Nov  2012   Followed by [name]  . Hyperlipidemia   . Hypertension   . Pulmonary embolism [facility])     Past Surgical History:  Procedure Laterality Date  . BACK SURGERY     1988  . BOWEL RESECTION    . CARDIAC SURGERY    . CERVICAL  SPINE SURGERY     " rods in my neck "  . CORONARY ARTERY BYPASS GRAFT    . CORONARY STENT PLACEMENT    . NM MYOCAR PERF WALL MOTION  12/27/2011   normal  . SPINE SURGERY     Family History:  Family History  Problem Relation Age of Onset  . Diabetes Mother   . Hypertension Mother   . Hyperlipidemia Mother   . Diabetes Father   . Cancer Father   . Hypertension Father   . Diabetes Brother   . Heart disease Brother   . Diabetes Sister   . Colon cancer Neg Hx    Family Psychiatric  History: Denies Social History:  Social History   Substance and Sexual Activity  Alcohol Use No  . Alcohol/week: 4.0 standard drinks  . Types: 2 Cans of beer, 2 Shots of liquor per week     Social History   Substance and Sexual Activity  Drug Use Yes  . Types: "Crack" cocaine, Cocaine    Social History   Socioeconomic History  . Marital status: Widowed    Spouse name: Not on file  . Number of children: 2  . Years of education: 76  . Highest education level: 12th grade  Occupational History    Employer: UNEMPLOYED    Comment: 04/2016  Social Needs  . Financial resource strain: Somewhat hard  . Food insecurity    Worry: Sometimes true    Inability: Sometimes true  . Transportation needs    Medical: Yes    Non-medical: Yes  Tobacco Use  . Smoking status: Never Smoker  . Smokeless tobacco: Never Used  Substance and Sexual Activity  . Alcohol use: No    Alcohol/week: 4.0 standard drinks    Types: 2 Cans of beer, 2 Shots of liquor per week  . Drug use: Yes    Types: "Crack" cocaine, Cocaine  . Sexual activity: Yes    Comment: accepted condoms  Lifestyle  . Physical activity    Days per week: 0 days    Minutes per session: 0 min  . Stress: Very much  Relationships  . Social Herbalist on phone: Once a week    Gets together: Never    Attends religious service: Never    Active member of club or organization: No    Attends meetings of clubs or organizations: Never     Relationship status: Widowed  Other Topics Concern  . Not on file  Social History Narrative   Currently staying with a friend in [place].  Was staying @ local motel until a few days ago - left b/c of bed bugs.   Additional Social History: N/A    Allergies:  No Known Allergies  Labs:  Results for orders placed or performed during the hospital encounter of 06/11/19 (from the past 48 hour(s))  CBG monitoring, ED     Status: Abnormal   Collection Time: 06/13/19  4:15 PM  Result Value Ref Range   Glucose-Capillary 133 (H) 70 - 99 mg/dL    Medications:  Current Facility-Administered Medications  Medication Dose Route Frequency Provider Last Rate Last Dose  .  acetaminophen (TYLENOL) tablet 650 mg  650 mg Oral Q4H PRN Daleen Bo, MD   650 mg at 06/13/19 1210  . albuterol (VENTOLIN HFA) 108 (90 Base) MCG/ACT inhaler 2 puff  2 puff Inhalation Q6H PRN Akintayo, Mojeed, MD      . emtricitabine-rilpivir-tenofovir AF (ODEFSEY) 200-25-25 MG per tablet 1 tablet  1 tablet Oral Q breakfast Darleene Cleaver, Mojeed, MD   1 tablet at 06/15/19 0920  . gabapentin (NEURONTIN) capsule 400 mg  400 mg Oral BID Akintayo, Mojeed, MD   400 mg at 06/15/19 2585  . hydrOXYzine (ATARAX/VISTARIL) tablet 25 mg  25 mg Oral TID PRN Corena Pilgrim, MD      . metFORMIN (GLUCOPHAGE) tablet 1,000 mg  1,000 mg Oral BID WC Akintayo, Mojeed, MD   1,000 mg at 06/15/19 0920  . sertraline (ZOLOFT) tablet 50 mg  50 mg Oral Daily Akintayo, Mojeed, MD   50 mg at 06/15/19 0920  . traZODone (DESYREL) tablet 50 mg  50 mg Oral QHS PRN Corena Pilgrim, MD       Current Outpatient Medications  Medication Sig Dispense Refill  . acetaminophen (TYLENOL) 500 MG tablet Take 1,000 mg by mouth every 6 (six) hours as needed for moderate pain or headache.    . albuterol (PROAIR HFA) 108 (90 Base) MCG/ACT inhaler INHALE TWO PUFFS INTO THE LUNGS EVERY 6 (SIX) HOURS AS NEEDED FOR WHEEZING OR SHORTNESS OF BREATH. (Patient taking differently: Inhale 2  puffs into the lungs every 6 (six) hours as needed for wheezing or shortness of breath. ) 8.5 Inhaler 0  . aspirin 81 MG chewable tablet Chew 81 mg by mouth daily.    Marland Kitchen emtricitabine-rilpivir-tenofovir AF (ODEFSEY) 200-25-25 MG TABS tablet Take 1 tablet by mouth daily with breakfast. 30 tablet 6  . Fluticasone Propionate, Inhal, (FLOVENT DISKUS) 100 MCG/BLIST AEPB Inhale 1 puff into the lungs 2 (two) times daily.    Marland Kitchen gabapentin (NEURONTIN) 400 MG capsule Take 1 capsule (400 mg total) by mouth 2 (two) times daily. For agitation/neuropathic pain 60 capsule 0  . hydrOXYzine (ATARAX/VISTARIL) 25 MG tablet Take 1 tablet (25 mg total) by mouth 3 (three) times daily as needed for anxiety. 90 tablet 0  . insulin aspart (NOVOLOG) 100 UNIT/ML injection Before each meal 3 times a day , 140-199 - 2 units, 200-250 - 4 units, 251-299 - 6 units,  300-349 - 8 units,  350 or above 10 units. Insulin PEN if approved, provide syringes and needles if needed. Can switch to any other cheaper alternative. (Patient taking differently: Inject 2-10 Units into the skin See admin instructions. Before each meal 3 times a day , 140-199 - 2 units, 200-250 - 4 units, 251-299 - 6 units,  300-349 - 8 units,  350 or above 10 units. Insulin PEN if approved, provide syringes and needles if needed. Can switch to any other cheaper alternative.) 10 mL 0  . insulin glargine (LANTUS) 100 UNIT/ML injection Inject 0.2 mLs (20 Units total) into the skin at bedtime. Dispense insulin pen if approved, if not dispense as needed syringes and needles for 1 month supply. Can switch to Levemir. Diagnosis E 11.65. 10 mL 0  . metFORMIN (GLUCOPHAGE) 1000 MG tablet Take 1,000 mg by mouth 2 (two) times daily.    . sertraline (ZOLOFT) 50 MG tablet Take 1 tablet (50 mg total) by mouth daily. 30 tablet 0  . traZODone (DESYREL) 50 MG tablet Take 1 tablet (50 mg total) by mouth at bedtime as needed for sleep.  30 tablet 0  . allopurinol (ZYLOPRIM) 300 MG tablet Take 1  tablet (300 mg total) by mouth daily. For gout. (Patient not taking: Reported on 06/11/2019) 30 tablet 0  . benzonatate (TESSALON) 100 MG capsule Take 2 capsules (200 mg total) by mouth 3 (three) times daily as needed for cough. 21 capsule 0  . blood glucose meter kit and supplies KIT Dispense based on patient and insurance preference. Use up to four times daily as directed. (FOR ICD-9 250.00, 250.01). For QAC - HS accuchecks. 1 each 1  . famotidine (PEPCID) 20 MG tablet Take 1 tablet (20 mg total) by mouth 2 (two) times daily. (Patient not taking: Reported on 06/11/2019) 60 tablet 1  . guaiFENesin (MUCINEX) 600 MG 12 hr tablet Take 1 tablet (600 mg total) by mouth 2 (two) times daily. (Patient not taking: Reported on 06/11/2019) 30 tablet 0  . Insulin Syringe-Needle U-100 25G X 1" 1 ML MISC For 4 times a day insulin SQ, 1 month supply. Diagnosis E11.65 30 each 0  . polyethylene glycol (MIRALAX / GLYCOLAX) 17 g packet Take 17 g by mouth 2 (two) times daily. (Patient not taking: Reported on 06/11/2019) 15 each 0    Musculoskeletal: Strength & Muscle Tone: within normal limits Gait & Station: normal Patient leans: N/A  Psychiatric Specialty Exam: Physical Exam  Nursing note and vitals reviewed. Constitutional: He is oriented to person, place, and time. He appears well-developed.  HENT:  Head: Normocephalic and atraumatic.  Neck: Normal range of motion.  Cardiovascular: Normal rate.  Respiratory: Effort normal.  Neurological: He is alert and oriented to person, place, and time.  Psychiatric: His speech is normal and behavior is normal. Cognition and memory are normal. He expresses impulsivity. He exhibits a depressed mood. He expresses suicidal ideation. He expresses suicidal plans (Plan to overdose on crack cocaine).    Review of Systems  Constitutional: Negative.   HENT: Negative.   Eyes: Negative.   Respiratory: Negative.   Cardiovascular: Negative.   Gastrointestinal: Negative.    Genitourinary: Negative.   Musculoskeletal: Negative.   Skin: Negative.   Neurological: Negative.   Endo/Heme/Allergies: Negative.   Psychiatric/Behavioral: Positive for depression, substance abuse and suicidal ideas.    Blood pressure (!) 141/94, pulse 81, temperature 98 F (36.7 C), temperature source Oral, resp. rate 16, SpO2 98 %.There is no height or weight on file to calculate BMI.  General Appearance: Casual  Eye Contact:  Fair  Speech:  Clear and Coherent  Volume:  Normal  Mood:  Depressed  Affect:  Congruent  Thought Process:  Coherent and Descriptions of Associations: Intact  Orientation:  Full (Time, Place, and Person)  Thought Content:  Logical  Suicidal Thoughts:  Yes.  with intent/plan  Homicidal Thoughts:  No  Memory:  Immediate;   Good Recent;   Good Remote;   Good  Judgement:  Fair  Insight:  Fair  Psychomotor Activity:  Normal  Concentration:  Concentration: Fair and Attention Span: Fair  Recall:  Good  Fund of Knowledge:  Good  Language:  Good  Akathisia:  No  Handed:  Right  AIMS (if indicated):   N/A  Assets:  Desire for Improvement  ADL's:  Intact  Cognition:  WNL  Sleep:   N/A     Treatment Plan Summary: Daily contact with patient to assess and evaluate symptoms and progress in treatment  Disposition: Recommend psychiatric Inpatient admission when medically cleared.  Patient has been admitted to 300-2. Orders have been placed. This  service was provided via telemedicine using a 2-way, interactive audio and Radiographer, therapeutic.  Names of all persons participating in this telemedicine service and their role in this encounter. Name: Zaveon Gillen Patient  Name: Sheran Fava FNP  Name: Dr. Mariea Clonts  DO    Suella Broad, FNP 06/15/2019 2:27 PM   Patient seen by telemedicine for psychiatric evaluation, chart reviewed and case discussed with the physician extender and developed treatment plan. Reviewed the information documented and  agree with the treatment plan.  Buford Dresser, DO 06/15/19 2:27 PM

## 2019-06-15 NOTE — Progress Notes (Signed)
Received Michael [name] at the change of shift in his bed asleep. He was compliant with his medication. He continued to endorsed feeling depressed and his affect is flat. He slept throughout the night.

## 2019-06-16 LAB — TSH: TSH: 1.668 u[IU]/mL (ref 0.350–4.500)

## 2019-06-16 LAB — GLUCOSE, CAPILLARY
Glucose-Capillary: 107 mg/dL — ABNORMAL HIGH (ref 70–99)
Glucose-Capillary: 211 mg/dL — ABNORMAL HIGH (ref 70–99)
Glucose-Capillary: 168 mg/dL — ABNORMAL HIGH (ref 70–99)
Glucose-Capillary: 206 mg/dL — ABNORMAL HIGH (ref 70–99)

## 2019-06-16 LAB — LIPID PANEL
Cholesterol: 195 mg/dL (ref 0–200)
HDL: 43 mg/dL (ref >40)
LDL Cholesterol: 124 mg/dL — ABNORMAL HIGH (ref 0–99)
Total CHOL/HDL Ratio: 4.5 RATIO
Triglycerides: 142 mg/dL (ref <150)
VLDL: 28 mg/dL (ref 0–40)

## 2019-06-16 NOTE — Progress Notes (Signed)
Patient ID: Michael [name], male   DOB: 1950-11-24, 68 y.o.   MRN: [id number] Pt has appropriate, pleasant and cooperative on approach. Positive for milieu activities with minimal prompting. Pt endorses moderate depression and anxiety and passive s.i. without a plan. Pt states he has feelings of guilt, hopelessness, and worthlessness related to his drug use and being estranged from children due to this.  Pt contracts for safety. Support and encouragement provided. Prompts as needed. Med ed reinforced.  Safety maintained. Cooperative.

## 2019-06-16 NOTE — Progress Notes (Signed)
[facility] MD Progress Note  06/16/2019 9:03 AM Michael [name]  MRN:  [id number] Subjective: Patient is a 68 year old male who was admitted on 06/15/2019 through the [facility] emergency department with suicidal ideation.  He has a history of unspecified depression and cocaine dependence.  Objective: Patient is seen and examined.  Patient is a 68 year old male with the above-stated past medical and psychiatric history who is seen in follow-up.  He was just admitted after transfer last night.  He is essentially unchanged.  He still having some degree of helplessness, hopelessness and worthlessness.  His blood sugar was better this morning at 107.  His blood pressure is good at 125/81.  Pulse was 86.  He is afebrile.  He slept 6.75 hours last night.  His only laboratories which were new were his lipid panel which showed a mildly elevated LDL at 124.  Principal Problem: <principal problem not specified> Diagnosis: Active Problems:   MDD (major depressive disorder), recurrent episode, severe ([place])  Total Time spent with patient: 15 minutes  Past Psychiatric History: See admission H&P  Past Medical History:  Past Medical History:  Diagnosis Date  . A-fib ([place])   . Asthma    No PFTs, history of childhood asthma  . CAD (coronary artery disease)    a. 06/2013 STEMI/PCI ([facility]): LAD w/ thrombus (treated with BMS), mid 75%, D2 75%; LCX OM2 75%; RCA small, PDA 95%, PLV 95%;  b. 10/2013 Cath/PCI: ISR w/in LAD (Promus DES x 2), borderline OM2 lesion;  c. 01/2014 MV: Intermediate risk, medium-sized distal ant wall infarct w/ very small amt of peri-infarct ischemia. EF 60%.  . Cellulitis 04/2014   left facial  . Chondromalacia of medial femoral condyle    Left knee MRI 04/28/12: Chondromalacia of the medial femoral condyle with slight peripheral degeneration of the meniscocapsular junction of the medial meniscus; followed by sports medicine  . Collagen vascular disease ([place])   . Crack cocaine  use    for 20+ years, has been enrolled in detox programs in the past  . Depression    with history of hospitalization for suicidal ideation  . Diabetes mellitus 2002   Diagnosed in 2002, started insulin in 2012  . Gout   . Gout 04/28/2012  . Headache(784.0)    CT head 08/2011: Periventricular and subcortical white matter hypodensities are most in keeping with chronic microangiopathic change  . HIV infection [facility]) Nov 2012   Followed by [name]  . Hyperlipidemia   . Hypertension   . Pulmonary embolism [facility])     Past Surgical History:  Procedure Laterality Date  . BACK SURGERY     1988  . BOWEL RESECTION    . CARDIAC SURGERY    . CERVICAL SPINE SURGERY     " rods in my neck "  . CORONARY ARTERY BYPASS GRAFT    . CORONARY STENT PLACEMENT    . NM MYOCAR PERF WALL MOTION  12/27/2011   normal  . SPINE SURGERY     Family History:  Family History  Problem Relation Age of Onset  . Diabetes Mother   . Hypertension Mother   . Hyperlipidemia Mother   . Diabetes Father   . Cancer Father   . Hypertension Father   . Diabetes Brother   . Heart disease Brother   . Diabetes Sister   . Colon cancer Neg Hx    Family Psychiatric  History: See admission H&P Social History:  Social History   Substance and  Sexual Activity  Alcohol Use No  . Alcohol/week: 4.0 standard drinks  . Types: 2 Cans of beer, 2 Shots of liquor per week     Social History   Substance and Sexual Activity  Drug Use Yes  . Types: "Crack" cocaine, Cocaine    Social History   Socioeconomic History  . Marital status: Widowed    Spouse name: Not on file  . Number of children: 2  . Years of education: 20  . Highest education level: 12th grade  Occupational History    Employer: UNEMPLOYED    Comment: 04/2016  Social Needs  . Financial resource strain: Somewhat hard  . Food insecurity    Worry: Sometimes true    Inability: Sometimes true  . Transportation needs    Medical: Yes    Non-medical: Yes   Tobacco Use  . Smoking status: Never Smoker  . Smokeless tobacco: Never Used  Substance and Sexual Activity  . Alcohol use: No    Alcohol/week: 4.0 standard drinks    Types: 2 Cans of beer, 2 Shots of liquor per week  . Drug use: Yes    Types: "Crack" cocaine, Cocaine  . Sexual activity: Yes    Comment: accepted condoms  Lifestyle  . Physical activity    Days per week: 0 days    Minutes per session: 0 min  . Stress: Very much  Relationships  . Social Herbalist on phone: Once a week    Gets together: Never    Attends religious service: Never    Active member of club or organization: No    Attends meetings of clubs or organizations: Never    Relationship status: Widowed  Other Topics Concern  . Not on file  Social History Narrative   Currently staying with a friend in [place].  Was staying @ local motel until a few days ago - left b/c of bed bugs.   Additional Social History:                         Sleep: Good  Appetite:  Fair  Current Medications: Current Facility-Administered Medications  Medication Dose Route Frequency Provider Last Rate Last Dose  . acetaminophen (TYLENOL) tablet 650 mg  650 mg Oral Q6H PRN Sharma Covert, MD      . albuterol (VENTOLIN HFA) 108 (90 Base) MCG/ACT inhaler 1-2 puff  1-2 puff Inhalation Q6H PRN Sharma Covert, MD      . allopurinol (ZYLOPRIM) tablet 300 mg  300 mg Oral Daily Sharma Covert, MD      . alum & mag hydroxide-simeth (MAALOX/MYLANTA) 200-200-20 MG/5ML suspension 30 mL  30 mL Oral Q4H PRN Sharma Covert, MD      . aspirin chewable tablet 81 mg  81 mg Oral Daily Sharma Covert, MD   81 mg at 06/15/19 1734  . diltiazem (CARDIZEM CD) 24 hr capsule 120 mg  120 mg Oral Daily Sharma Covert, MD   120 mg at 06/15/19 1732  . emtricitabine-rilpivir-tenofovir AF (ODEFSEY) 200-25-25 MG per tablet 1 tablet  1 tablet Oral Q breakfast Sharma Covert, MD      . famotidine (PEPCID) tablet 20 mg  20  mg Oral BID Sharma Covert, MD   20 mg at 06/15/19 1737  . fluticasone (FLOVENT HFA) 44 MCG/ACT inhaler 1 puff  1 puff Inhalation BID Sharma Covert, MD   1 puff at 06/15/19 2132  .  gabapentin (NEURONTIN) capsule 400 mg  400 mg Oral BID Sharma Covert, MD   400 mg at 06/15/19 1733  . hydrOXYzine (ATARAX/VISTARIL) tablet 25 mg  25 mg Oral TID PRN Sharma Covert, MD      . insulin aspart (novoLOG) injection 0-15 Units  0-15 Units Subcutaneous TID WC Sharma Covert, MD   2 Units at 06/15/19 1741  . insulin glargine (LANTUS) injection 20 Units  20 Units Subcutaneous QHS Sharma Covert, MD   20 Units at 06/15/19 2132  . magnesium hydroxide (MILK OF MAGNESIA) suspension 30 mL  30 mL Oral Daily PRN Sharma Covert, MD      . metFORMIN (GLUCOPHAGE) tablet 1,000 mg  1,000 mg Oral BID WC Sharma Covert, MD   1,000 mg at 06/15/19 1732  . nitroGLYCERIN (NITROSTAT) SL tablet 0.4 mg  0.4 mg Sublingual Q5 min PRN Sharma Covert, MD      . polyethylene glycol (MIRALAX / Floria Raveling) packet 17 g  17 g Oral Daily PRN Sharma Covert, MD      . sertraline (ZOLOFT) tablet 50 mg  50 mg Oral Daily Sharma Covert, MD      . traZODone (DESYREL) tablet 100 mg  100 mg Oral QHS PRN Sharma Covert, MD   100 mg at 06/15/19 2133    Lab Results:  Results for orders placed or performed during the hospital encounter of 06/15/19 (from the past 48 hour(s))  Glucose, capillary     Status: Abnormal   Collection Time: 06/15/19  5:39 PM  Result Value Ref Range   Glucose-Capillary 148 (H) 70 - 99 mg/dL   Comment 1 Notify RN    Comment 2 Document in Chart   Glucose, capillary     Status: Abnormal   Collection Time: 06/15/19  9:29 PM  Result Value Ref Range   Glucose-Capillary 187 (H) 70 - 99 mg/dL   Comment 1 Notify RN   Lipid panel     Status: Abnormal   Collection Time: 06/16/19  6:25 AM  Result Value Ref Range   Cholesterol 195 0 - 200 mg/dL   Triglycerides 142 <150 mg/dL   HDL  43 >40 mg/dL   Total CHOL/HDL Ratio 4.5 RATIO   VLDL 28 0 - 40 mg/dL   LDL Cholesterol 124 (H) 0 - 99 mg/dL    Comment:        Total Cholesterol/HDL:CHD Risk Coronary Heart Disease Risk Table                     Men   Women  1/2 Average Risk   3.4   3.3  Average Risk       5.0   4.4  2 X Average Risk   9.6   7.1  3 X Average Risk  23.4   11.0        Use the calculated Patient Ratio above and the CHD Risk Table to determine the patient's CHD Risk.        ATP III CLASSIFICATION (LDL):  <100     mg/dL   Optimal  100-129  mg/dL   Near or Above                    Optimal  130-159  mg/dL   Borderline  160-189  mg/dL   High  >190     mg/dL   Very High Performed at [facility]  [address] [place], [place] [address]   TSH     Status: None   Collection Time: 06/16/19  6:25 AM  Result Value Ref Range   TSH 1.668 0.350 - 4.500 uIU/mL    Comment: Performed by a 3rd Generation assay with a functional sensitivity of <=0.01 uIU/mL. Performed at [facility], [address] [place], [place] [address]   Glucose, capillary     Status: Abnormal   Collection Time: 06/16/19  6:25 AM  Result Value Ref Range   Glucose-Capillary 107 (H) 70 - 99 mg/dL    Blood Alcohol level:  Lab Results  Component Value Date   ETH 17 (H) 06/11/2019   ETH <10 96/28/3662    Metabolic Disorder Labs: Lab Results  Component Value Date   HGBA1C 13.2 (H) 04/15/2019   MPG 332 04/15/2019   MPG 314.92 03/19/2019   No results found for: PROLACTIN Lab Results  Component Value Date   CHOL 195 06/16/2019   TRIG 142 06/16/2019   HDL 43 06/16/2019   CHOLHDL 4.5 06/16/2019   VLDL 28 06/16/2019   LDLCALC 124 (H) 06/16/2019   LDLCALC 110 (H) 05/04/2018    Physical Findings: AIMS: Facial and Oral Movements Muscles of Facial Expression: None, normal Lips and Perioral Area: None, normal Jaw: None, normal Tongue: None, normal,Extremity Movements Upper (arms, wrists,  hands, fingers): None, normal Lower (legs, knees, ankles, toes): None, normal, Trunk Movements Neck, shoulders, hips: None, normal, Overall Severity Severity of abnormal movements (highest score from questions above): None, normal Incapacitation due to abnormal movements: None, normal Patient's awareness of abnormal movements (rate only patient's report): No Awareness, Dental Status Current problems with teeth and/or dentures?: No Does patient usually wear dentures?: No  CIWA:    COWS:     Musculoskeletal: Strength & Muscle Tone: decreased Gait & Station: shuffle Patient leans: N/A  Psychiatric Specialty Exam: Physical Exam  Nursing note and vitals reviewed. Constitutional: He is oriented to person, place, and time. He appears well-developed and well-nourished.  HENT:  Head: Normocephalic and atraumatic.  Respiratory: Effort normal.  Neurological: He is alert and oriented to person, place, and time.    ROS  Blood pressure 125/81, pulse 86, temperature 97.7 F (36.5 C), temperature source Oral, resp. rate 18, height 5\' 8"  (1.727 m), weight 74.4 kg, SpO2 100 %.Body mass index is 24.94 kg/m.  General Appearance: Disheveled  Eye Contact:  Fair  Speech:  Normal Rate  Volume:  Decreased  Mood:  Anxious and Depressed  Affect:  Congruent  Thought Process:  Coherent and Descriptions of Associations: Intact  Orientation:  Full (Time, Place, and Person)  Thought Content:  Logical  Suicidal Thoughts:  Yes.  without intent/plan  Homicidal Thoughts:  No  Memory:  Immediate;   Fair Recent;   Fair Remote;   Fair  Judgement:  Intact  Insight:  Fair  Psychomotor Activity:  Decreased and Psychomotor Retardation  Concentration:  Concentration: Fair and Attention Span: Fair  Recall:  [employer] of Knowledge:  Fair  Language:  Good  Akathisia:  Negative  Handed:  Right  AIMS (if indicated):     Assets:  Desire for Improvement Resilience  ADL's:  Intact  Cognition:  WNL  Sleep:   Number of Hours: 6.75     Treatment Plan Summary: Daily contact with patient to assess and evaluate symptoms and progress in treatment, Medication management and Plan : Patient is seen and examined.  Patient is a 68 year old male with the above-stated past  psychiatric history is seen in follow-up.   Diagnosis: #1 substance-induced mood disorder versus major depression, recurrent, severe without psychotic features, #2 cocaine dependence, #3 coronary artery disease, #4 diabetes mellitus type 2, #5 chronic renal disease stage III, #6 HIV, #7 essential hypertension, #8 history of pulmonary emboli, #9 gout, #10 chondromalacia  Patient is seen in follow-up.  He is essentially unchanged from yesterday.  No changes medications today.  Blood pressure is stable, blood sugars improved.  He denied any chest pain or shortness of breath.  He will discussed with social work potential for residential treatment. 1.  Continue albuterol HFA 1 to 2 puffs every 6 hours as needed wheezing. 2.  Continue allopurinol 300 mg p.o. daily for gout. 3.  Continue coated aspirin 81 mg p.o. daily for heart disease. 4.  Continue diltiazem CD 124 mg p.o. daily for blood pressure as well as rate control. 5.  Continue Odefsey for HIV disease. 6.  Continue Pepcid 20 mg p.o. twice daily for GERD. 7.  Continue Flovent HFA 1 puff twice daily for wheezing and shortness of breath. 8.  Continue Neurontin 400 mg p.o. twice daily for peripheral neuropathy and chronic pain. 9.  Continue sliding scale insulin for diabetes mellitus. 10.  Continue Lantus insulin 20 units subcu nightly for diabetes mellitus. 11.  Continue Metformin at thousand milligrams p.o. twice daily with food for diabetes mellitus. 12.  Continue nitroglycerin sublingual 0.4 mg as needed chest pain. 13.  Continue MiraLAX 17 g in 8 ounces water as needed for constipation. 13.  Continue sertraline 50 mg p.o. daily for depression and anxiety. 14.  Continue trazodone 100 mg  p.o. nightly as needed insomnia. 14.  Disposition planning-in progress.  Sharma Covert, MD 06/16/2019, 9:03 AM

## 2019-06-16 NOTE — BHH Group Notes (Signed)
Occupational Therapy Group Note  Date:  06/16/2019 Time:  12:23 PM  Group Topic/Focus:  Self Esteem Action Plan:   The focus of this group is to help patients create a plan to continue to build self-esteem after discharge.  Participation Level:  Minimal  Participation Quality:  Attentive  Affect:  Depressed and Flat  Cognitive:  Alert  Insight: Improving  Engagement in Group:  Limited  Modes of Intervention:  Activity, Discussion, Education and Socialization  Additional Comments:   S: none stated this date  O: OT tx with focus on self esteem building this date. Education given on definition of self esteem, with both causes of low and high self esteem identified. Activity given for pt to identify a positive/aspiring trait for each letter of the alphabet. Pt to work with peers to help complete activity and build positive thinking.   A: Pt not offering subjective statements this session, mostly nodding and following along with discussion. Pt completed A-Z activity with 50% completion.  P: OT group will be x1 per week while pt in [place], MSOT, OTR/L [place] Office: [place] 06/16/2019, 12:23 PM

## 2019-06-16 NOTE — BHH Counselor (Signed)
Adult Comprehensive Assessment  Patient ID: Michael [name], male   DOB: 04-May-1951, 68 y.o.   MRN: [id number]  Information Source: Information source: Patient  Current Stressors:  Patient states their primary concerns and needs for treatment are:: Pt reprots "I became suicidal and when I become suicidal it drives me toward crack cocaine." Patient states their goals for this hospitilization and ongoing recovery are:: Pt reports "not use anymore". Educational / Learning stressors: Pt denies. Employment / Job issues: Pt denies. Family Relationships: Pt reports "I don't have much support". Financial / Lack of resources (include bankruptcy): Pt denies. Housing / Lack of housing: Pt reports "I'm homeless." Physical health (include injuries & life threatening diseases): Pt reports "I'm disabled".  Pt reports that he has high blood pressure and diabetes. Social relationships: Pt denies. Substance abuse: Pt reports crack cocaine use. Bereavement / Loss: Pt reports that his wife passed 3 years ago.  Living/Environment/Situation:  Living Arrangements: Other (Comment)(Homeless) How long has patient lived in current situation?: Pt reports that he has been homeless for 4 months and staying in hotels and in [place]. What is atmosphere in current home: Chaotic  Family History:  Marital status: Widowed Widowed, when?: 2017 Are you sexually active?: Yes What is your sexual orientation?: Heterosexual Has your sexual activity been affected by drugs, alcohol, medication, or emotional stress?: Pt reports "just stress". Does patient have children?: Yes How many children?: 2 How is patient's relationship with their children?: Pt reports "not good".  Childhood History:  By whom was/is the patient raised?: Both parents Description of patient's relationship with caregiver when they were a child: Pt reports "it was great". Patient's description of current relationship with people who raised him/her: Pt  reports that parents are deceased. How were you disciplined when you got in trouble as a child/adolescent?: Pt reports "they talked to me". Does patient have siblings?: Yes Number of Siblings: 5 Description of patient's current relationship with siblings: Pt reports "it's kind of shaky". Did patient suffer any verbal/emotional/physical/sexual abuse as a child?: No Did patient suffer from severe childhood neglect?: No Has patient ever been sexually abused/assaulted/raped as an adolescent or adult?: No Was the patient ever a victim of a crime or a disaster?: No Witnessed domestic violence?: No Has patient been effected by domestic violence as an adult?: No  Education:  Highest grade of school patient has completed: 12th Currently a student?: No Learning disability?: No  Employment/Work Situation:   Employment situation: On disability Why is patient on disability: Pt reports "I was injured on my job.  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 Did You Receive Any Psychiatric Treatment/Services While in the Military?: No(NA) Are There Guns or Other Weapons in Lupus?: No  Financial Resources:   Does patient have a representative payee or guardian?: No  Alcohol/Substance Abuse:   What has been your use of drugs/alcohol within the last 12 months?: Pt reports crack cocaine use. Pt reports that he only uses 2x a month.  Patient reports that he spends on average $200.  Last use was last week. If attempted suicide, did drugs/alcohol play a role in this?: Yes(Pt reports last attempt was one year ago when he attempted to slit his wrist and planned to walk into traffic.") Alcohol/Substance Abuse Treatment Hx: Past Tx, Inpatient Has alcohol/substance  abuse ever caused legal problems?: No  Social Support System:    Heritage manager System: None Type of faith/religion: Methodist How does patient's faith help to cope with current illness?: Pt reports "sometimes I go to church".  Leisure/Recreation:   Leisure and Hobbies: Pt reports "I fish".  Strengths/Needs:   What is the patient's perception of their strengths?: Pt reports "I don't know it is very low." Patient states these barriers may affect/interfere with their treatment: Pt reports that he is homeless. Patient states these barriers may affect their return to the community: Pt reports that he is homeless.  Discharge Plan:   Currently receiving community mental health services: No Patient states concerns and preferences for aftercare planning are: Pt reports that he is seeking inpatient. Patient states they will know when they are safe and ready for discharge when: Pt reports "get my mind right". Does patient have access to transportation?: No Does patient have financial barriers related to discharge medications?: No Plan for no access to transportation at discharge: CSW will need to support with transportation. Plan for living situation after discharge: Pt reports that he wants resources for homeless shelters and is open to going to the [employer]. Will patient be returning to same living situation after discharge?: No  Summary/Recommendations:   Summary and Recommendations (to be completed by the evaluator): Patient is a 68 year old widowed male from [place], [place]).   He reports that he is currently on disability.  He presents to the hospital following suicidal ideation.  He has a primary diagnosis of Major Depressive Disorder.  Recommendations include: crisis stabilization, therapeutic milieu, encourage group attendance and participation, medication management for detox/mood stabilization and development of comprehensive mental wellness/sobriety plan.  Rozann Lesches. 06/16/2019

## 2019-06-16 NOTE — Progress Notes (Signed)
D: Pt was in bed in his room upon initial approach.  Pt presents with depressed affect and mood.  His goal is "get better.  I just have a lot on my mind."  Pt denies HI, denies hallucinations, denies pain.  He reports SI without a plan.  Pt has been isolative to his room for the majority of the evening.  A: Introduced self to pt.  Met with pt 1:1.  Actively listened to pt and offered support and encouragement.  Medications administered per order.  PRN medication administered for sleep.  15 minute safety checks in place.  R: Pt is safe on the unit.  Pt is compliant with medications.  Pt verbally contracts for safety.  Will continue to monitor and assess.

## 2019-06-16 NOTE — Evaluation (Signed)
Occupational Therapy Evaluation Patient Details Name: Michael [name] MRN: [id number] DOB: Mar 15, 1951 Today's Date: 06/16/2019    History of Present Illness Patient is a 68 year old male who was admitted on 06/15/2019 through the [facility] emergency department with suicidal ideation.  He has a history of unspecified depression and cocaine dependence. Pt with recent admission for COVID 19 and executive function complications   Clinical Impression   Pt admitted with above diagnoses, seen for generalized weakness impact on BADL functioning. PTA, pt is homeless, recent admission with COVID 19 and therapists recommending [facility]. At time of eval, he is overall supervision for transfers. He needs min A for LB bathing/dressing at this time and is generally weak with decreased activity tolerance. Will continue to follow pt for implementation on LB dressing equipment and appropriate DME for upmost BADL ind. Recommending HHOT for d/c, but unsure of where pt is d/c at this time. Will follow per POC listed below.     Follow Up Recommendations  Home health OT;Supervision/Assistance - 24 hour    Equipment Recommendations  3 in 1 bedside commode;Other (comment)(cane)    Recommendations for Other Services       Precautions / Restrictions Precautions Precautions: Fall Restrictions Weight Bearing Restrictions: No      Mobility Bed Mobility               General bed mobility comments: up in dayroom  Transfers Overall transfer level: Needs assistance Equipment used: None Transfers: Sit to/from Stand Sit to Stand: Supervision              Balance Overall balance assessment: Needs assistance Sitting-balance support: Bilateral upper extremity supported;No upper extremity supported Sitting balance-Leahy Scale: Good     Standing balance support: Bilateral upper extremity supported;During functional activity;No upper extremity supported Standing balance-Leahy Scale:  Fair                             ADL either performed or assessed with clinical judgement   ADL Overall ADL's : Needs assistance/impaired Eating/Feeding: Set up;Sitting   Grooming: Supervision/safety;Standing   Upper Body Bathing: Supervision/ safety;Sitting   Lower Body Bathing: Minimal assistance;Sitting/lateral leans;Sit to/from stand;With adaptive equipment Lower Body Bathing Details (indicate cue type and reason): requires use of long handled equipment for optimum safety Upper Body Dressing : Set up;Sitting   Lower Body Dressing: Minimal assistance;Sitting/lateral leans;Sit to/from stand;With adaptive equipment Lower Body Dressing Details (indicate cue type and reason): needs sock aide, reacher Toilet Transfer: Supervision/safety;Regular Toilet;Grab bars Toilet Transfer Details (indicate cue type and reason): needs to push up from seat or grab bars Toileting- Clothing Manipulation and Hygiene: Supervision/safety;Sitting/lateral lean   Tub/ Shower Transfer: Min guard;Shower seat   Functional mobility during ADLs: Min guard;Cane General ADL Comments: pt ltd 2/2 generalized weakness     Vision Patient Visual Report: No change from baseline       Perception     Praxis      Pertinent Vitals/Pain Pain Assessment: Faces Faces Pain Scale: Hurts little more Pain Location: back, chest Pain Descriptors / Indicators: Aching Pain Intervention(s): Monitored during session     Hand Dominance     Extremity/Trunk Assessment Upper Extremity Assessment Upper Extremity Assessment: Generalized weakness(atrophy at thenar emminence, difficulty picking up small items like coins)   Lower Extremity Assessment Lower Extremity Assessment: Generalized weakness(unable to stand from seated position without use of hands)       Communication  Cognition Arousal/Alertness: Awake/alert Behavior During Therapy: WFL for tasks assessed/performed Overall Cognitive Status:  Within Functional Limits for tasks assessed                                 General Comments: per chart review executive functioning noted to be impaired, suspect from cocaine use. Pt clear and appropriate this date   General Comments       Exercises     Shoulder Instructions      Home Living Family/patient expects to be discharged to:: Shelter/Homeless Living Arrangements: Other (Comment)(Homeless)                               Additional Comments: was in drug rehab, then quarantined in a hotel, now homeless      Prior Functioning/Environment Level of Independence: Independent with assistive device(s)        Comments: reports independent with mobility, using SPC for mobility and independent with ADLs stating "I do my best"        OT Problem List: Decreased strength;Decreased coordination;Pain;Decreased range of motion;Decreased activity tolerance;Impaired balance (sitting and/or standing);Decreased knowledge of use of DME or AE      OT Treatment/Interventions: Self-care/ADL training;Therapeutic exercise;Therapeutic activities;Energy conservation;DME and/or AE instruction;Patient/family education;Balance training    OT Goals(Current goals can be found in the care plan section) Acute Rehab OT Goals Patient Stated Goal: to find place to live OT Goal Formulation: With patient Time For Goal Achievement: 06/30/19 Potential to Achieve Goals: Good  OT Frequency: Min 2X/week   Barriers to D/C:            Co-evaluation              AM-PAC OT "6 Clicks" Daily Activity     Outcome Measure Help from another person eating meals?: None Help from another person taking care of personal grooming?: None Help from another person toileting, which includes using toliet, bedpan, or urinal?: None Help from another person bathing (including washing, rinsing, drying)?: A Little Help from another person to put on and taking off regular upper body  clothing?: None Help from another person to put on and taking off regular lower body clothing?: A Little 6 Click Score: 22   End of Session Equipment Utilized During Treatment: Gait belt Nurse Communication: Mobility status;Other (comment)(can permitted on unit)  Activity Tolerance: Patient tolerated treatment well Patient left: Other (comment)(in dayroom)  OT Visit Diagnosis: Other abnormalities of gait and mobility (R26.89);Muscle weakness (generalized) (M62.81);Pain Pain - part of body: (back, chest)                Time: 1430-1450 OT Time Calculation (min): 20 min Charges:  OT General Charges $OT Visit: 1 Visit OT Evaluation $OT Eval Moderate Complexity: [place], MSOT, OTR/L [employer] OT/ Acute Relief OT PHP Office: [phone] WL Office: [phone]  Zenovia Jarred 06/16/2019, 3:14 PM

## 2019-06-16 NOTE — Tx Team (Signed)
Interdisciplinary Treatment and Diagnostic Plan Update  06/16/2019 Time of Session: 9:00am Michael [name] MRN: [id number]  Principal Diagnosis: <principal problem not specified>  Secondary Diagnoses: Active Problems:   MDD (major depressive disorder), recurrent episode, severe ([name])   Current Medications:  Current Facility-Administered Medications  Medication Dose Route Frequency Provider Last Rate Last Dose  . acetaminophen (TYLENOL) tablet 650 mg  650 mg Oral Q6H PRN Sharma Covert, MD      . albuterol (VENTOLIN HFA) 108 (90 Base) MCG/ACT inhaler 1-2 puff  1-2 puff Inhalation Q6H PRN Sharma Covert, MD      . allopurinol (ZYLOPRIM) tablet 300 mg  300 mg Oral Daily Sharma Covert, MD      . alum & mag hydroxide-simeth (MAALOX/MYLANTA) 200-200-20 MG/5ML suspension 30 mL  30 mL Oral Q4H PRN Sharma Covert, MD      . aspirin chewable tablet 81 mg  81 mg Oral Daily Sharma Covert, MD   81 mg at 06/15/19 1734  . diltiazem (CARDIZEM CD) 24 hr capsule 120 mg  120 mg Oral Daily Sharma Covert, MD   120 mg at 06/15/19 1732  . emtricitabine-rilpivir-tenofovir AF (ODEFSEY) 200-25-25 MG per tablet 1 tablet  1 tablet Oral Q breakfast Sharma Covert, MD      . famotidine (PEPCID) tablet 20 mg  20 mg Oral BID Sharma Covert, MD   20 mg at 06/15/19 1737  . fluticasone (FLOVENT HFA) 44 MCG/ACT inhaler 1 puff  1 puff Inhalation BID Sharma Covert, MD   1 puff at 06/15/19 2132  . gabapentin (NEURONTIN) capsule 400 mg  400 mg Oral BID Sharma Covert, MD   400 mg at 06/15/19 1733  . hydrOXYzine (ATARAX/VISTARIL) tablet 25 mg  25 mg Oral TID PRN Sharma Covert, MD      . insulin aspart (novoLOG) injection 0-15 Units  0-15 Units Subcutaneous TID WC Sharma Covert, MD   2 Units at 06/15/19 1741  . insulin glargine (LANTUS) injection 20 Units  20 Units Subcutaneous QHS Sharma Covert, MD   20 Units at 06/15/19 2132  . magnesium hydroxide (MILK OF MAGNESIA)  suspension 30 mL  30 mL Oral Daily PRN Sharma Covert, MD      . metFORMIN (GLUCOPHAGE) tablet 1,000 mg  1,000 mg Oral BID WC Sharma Covert, MD   1,000 mg at 06/15/19 1732  . nitroGLYCERIN (NITROSTAT) SL tablet 0.4 mg  0.4 mg Sublingual Q5 min PRN Sharma Covert, MD      . polyethylene glycol (MIRALAX / GLYCOLAX) packet 17 g  17 g Oral Daily PRN Sharma Covert, MD      . sertraline (ZOLOFT) tablet 50 mg  50 mg Oral Daily Sharma Covert, MD      . traZODone (DESYREL) tablet 100 mg  100 mg Oral QHS PRN Sharma Covert, MD   100 mg at 06/15/19 2133   PTA Medications: No medications prior to admission.    Patient Stressors: Financial difficulties Health problems Medication change or noncompliance Substance abuse  Patient Strengths: Average or above average intelligence Capable of independent living Financial means Supportive family/friends  Treatment Modalities: Medication Management, Group therapy, Case management,  1 to 1 session with clinician, Psychoeducation, Recreational therapy.   Physician Treatment Plan for Primary Diagnosis: <principal problem not specified> Long Term Goal(s): Improvement in symptoms so as ready for discharge Improvement in symptoms so as ready for discharge   Short Term  Goals: Ability to identify changes in lifestyle to reduce recurrence of condition will improve Ability to verbalize feelings will improve Ability to disclose and discuss suicidal ideas Ability to demonstrate self-control will improve Ability to identify and develop effective coping behaviors will improve Ability to maintain clinical measurements within normal limits will improve Compliance with prescribed medications will improve Ability to identify triggers associated with substance abuse/mental health issues will improve Ability to identify changes in lifestyle to reduce recurrence of condition will improve Ability to verbalize feelings will improve Ability to  disclose and discuss suicidal ideas Ability to demonstrate self-control will improve Ability to identify and develop effective coping behaviors will improve Ability to maintain clinical measurements within normal limits will improve Compliance with prescribed medications will improve Ability to identify triggers associated with substance abuse/mental health issues will improve  Medication Management: Evaluate patient's response, side effects, and tolerance of medication regimen.  Therapeutic Interventions: 1 to 1 sessions, Unit Group sessions and Medication administration.  Evaluation of Outcomes: Not Met  Physician Treatment Plan for Secondary Diagnosis: Active Problems:   MDD (major depressive disorder), recurrent episode, severe ([place])  Long Term Goal(s): Improvement in symptoms so as ready for discharge Improvement in symptoms so as ready for discharge   Short Term Goals: Ability to identify changes in lifestyle to reduce recurrence of condition will improve Ability to verbalize feelings will improve Ability to disclose and discuss suicidal ideas Ability to demonstrate self-control will improve Ability to identify and develop effective coping behaviors will improve Ability to maintain clinical measurements within normal limits will improve Compliance with prescribed medications will improve Ability to identify triggers associated with substance abuse/mental health issues will improve Ability to identify changes in lifestyle to reduce recurrence of condition will improve Ability to verbalize feelings will improve Ability to disclose and discuss suicidal ideas Ability to demonstrate self-control will improve Ability to identify and develop effective coping behaviors will improve Ability to maintain clinical measurements within normal limits will improve Compliance with prescribed medications will improve Ability to identify triggers associated with substance abuse/mental health  issues will improve     Medication Management: Evaluate patient's response, side effects, and tolerance of medication regimen.  Therapeutic Interventions: 1 to 1 sessions, Unit Group sessions and Medication administration.  Evaluation of Outcomes: Not Met   RN Treatment Plan for Primary Diagnosis: <principal problem not specified> Long Term Goal(s): Knowledge of disease and therapeutic regimen to maintain health will improve  Short Term Goals: Ability to verbalize feelings will improve, Ability to identify and develop effective coping behaviors will improve and Compliance with prescribed medications will improve  Medication Management: RN will administer medications as ordered by provider, will assess and evaluate patient's response and provide education to patient for prescribed medication. RN will report any adverse and/or side effects to prescribing provider.  Therapeutic Interventions: 1 on 1 counseling sessions, Psychoeducation, Medication administration, Evaluate responses to treatment, Monitor vital signs and CBGs as ordered, Perform/monitor CIWA, COWS, AIMS and Fall Risk screenings as ordered, Perform wound care treatments as ordered.  Evaluation of Outcomes: Not Met   LCSW Treatment Plan for Primary Diagnosis: <principal problem not specified> Long Term Goal(s): Safe transition to appropriate next level of care at discharge, Engage patient in therapeutic group addressing interpersonal concerns.  Short Term Goals: Engage patient in aftercare planning with referrals and resources, Increase social support, Identify triggers associated with mental health/substance abuse issues and Increase skills for wellness and recovery  Therapeutic Interventions: Assess for all discharge needs, 1  to 1 time with Education officer, museum, Explore available resources and support systems, Assess for adequacy in community support network, Educate family and significant other(s) on suicide prevention, Complete  Psychosocial Assessment, Interpersonal group therapy.  Evaluation of Outcomes: Not Met   Progress in Treatment: Attending groups: No. New to unit. Participating in groups: No. Taking medication as prescribed: Yes. Toleration medication: Yes. Family/Significant other contact made: No, will contact:  supports if consents are granted. Patient understands diagnosis: Yes. Discussing patient identified problems/goals with staff: Yes. Medical problems stabilized or resolved: No. Denies suicidal/homicidal ideation: No. Issues/concerns per patient self-inventory: Yes.  New problem(s) identified: Yes, Describe:  homelessness, limited social supports  New Short Term/Long Term Goal(s): detox, medication management for mood stabilization; elimination of SI thoughts; development of comprehensive mental wellness/sobriety plan.  Patient Goals:    Discharge Plan or Barriers: CSW assessing for appropriate referrals. Patient will likely benefit from shelter resources and [employer].   Reason for Continuation of Hospitalization: Anxiety Depression Medication stabilization Suicidal ideation  Estimated Length of Stay: 3-5 days  Attendees: Patient: 06/16/2019 10:51 AM  Physician: Queen Blossom 06/16/2019 10:51 AM  Nursing: Selinda Eon, LPN 0/01/7997 72:15 AM  RN Care Manager: 06/16/2019 10:51 AM  Social Worker: Stephanie Acre, [place] 06/16/2019 10:51 AM  Recreational Therapist:  06/16/2019 10:51 AM  Other:  06/16/2019 10:51 AM  Other:  06/16/2019 10:51 AM  Other: 06/16/2019 10:51 AM    Scribe for Treatment Team: Joellen [place], [place] 06/16/2019 10:51 AM

## 2019-06-16 NOTE — [facility] Group Notes (Signed)
[place] Group Notes:  (Nursing/MHT/Case Management/Adjunct)  Date:  06/16/2019  Time:  1:30 PM  Type of Therapy:  Nurse Education  Participation Level:  Minimal  Participation Quality:  Appropriate and Attentive  Affect:  Appropriate  Cognitive:  Alert and Appropriate  Insight:  Appropriate and Good  Engagement in Group:  Developing/Improving  Modes of Intervention:  Discussion, Education and Exploration  Summary of Progress/Problems: Pt's were asked how they were progressing with their personal daily goal set towards personal development. Pt's were asked how they try to developdaily/weekly/monthly  personally , what they were doing at their best, and how this compared to right before they came in. Pt's discussed how to get back to the habits that made them feel accomplished/successful, and communicated the needs they needed to get there. Pt was meeting with the Dr. And came in late. Pt was appropriate, but shared minimally. Pt was attentive.   [place] 06/16/2019, 2:45 PM

## 2019-06-16 NOTE — [facility] Suicide Risk Assessment (Signed)
[place] INPATIENT:  Family/Significant Other Suicide Prevention Education  Suicide Prevention Education:  Patient Refusal for Family/Significant Other Suicide Prevention Education: The patient Equan Cogbill has refused to provide written consent for family/significant other to be provided Family/Significant Other Suicide Prevention Education during admission and/or prior to discharge.  Physician notified.  SPE completed with pt, as pt refused to consent to family contact. SPI pamphlet provided to pt and pt was encouraged to share information with support network, ask questions, and talk about any concerns relating to SPE. Pt denies access to guns/firearms and verbalized understanding of information provided. Mobile Crisis information also provided to pt.   Rozann Lesches 06/16/2019, 2:31 PM

## 2019-06-16 NOTE — Progress Notes (Signed)
Patient ID: Michael [name], male   DOB: Sep 08, 1951, 68 y.o.   MRN: [id number] [place] NOVEL CORONAVIRUS (COVID-19) DAILY CHECK-OFF SYMPTOMS - answer yes or no to each - every day NO YES  Have you had a fever in the past 24 hours?  . Fever (Temp > 37.80C / 100F) X   Have you had any of these symptoms in the past 24 hours? . New Cough .  Sore Throat  .  Shortness of Breath .  Difficulty Breathing .  Unexplained Body Aches   X   Have you had any one of these symptoms in the past 24 hours not related to allergies?   . Runny Nose .  Nasal Congestion .  Sneezing   X   If you have had runny nose, nasal congestion, sneezing in the past 24 hours, has it worsened?  X   EXPOSURES - check yes or no X   Have you traveled outside the state in the past 14 days?  X   Have you been in contact with someone with a confirmed diagnosis of COVID-19 or PUI in the past 14 days without wearing appropriate PPE?  X   Have you been living in the same home as a person with confirmed diagnosis of COVID-19 or a PUI (household contact)?    X   Have you been diagnosed with COVID-19?    X              What to do next: Answered NO to all: Answered YES to anything:   Proceed with unit schedule Follow the [facility] Inpatient Flowsheet.

## 2019-06-17 LAB — IRON AND TIBC
Iron: 26 ug/dL — ABNORMAL LOW (ref 45–182)
Saturation Ratios: 6 % — ABNORMAL LOW (ref 17.9–39.5)
TIBC: 428 ug/dL (ref 250–450)
UIBC: 402 ug/dL

## 2019-06-17 LAB — HEPATIC FUNCTION PANEL
ALT: 11 U/L (ref 0–44)
AST: 15 U/L (ref 15–41)
Albumin: 3.7 g/dL (ref 3.5–5.0)
Alkaline Phosphatase: 57 U/L (ref 38–126)
Bilirubin, Direct: <0.1 mg/dL (ref 0.0–0.2)
Total Bilirubin: 0.3 mg/dL (ref 0.3–1.2)
Total Protein: 7.6 g/dL (ref 6.5–8.1)

## 2019-06-17 LAB — CBC WITH DIFFERENTIAL/PLATELET
Abs Immature Granulocytes: 0.03 10*3/uL (ref 0.00–0.07)
Basophils Absolute: 0 10*3/uL (ref 0.0–0.1)
Basophils Relative: 0 %
Eosinophils Absolute: 0.1 10*3/uL (ref 0.0–0.5)
Eosinophils Relative: 1 %
HCT: 33.5 % — ABNORMAL LOW (ref 39.0–52.0)
Hemoglobin: 10.3 g/dL — ABNORMAL LOW (ref 13.0–17.0)
Immature Granulocytes: 0 %
Lymphocytes Relative: 19 %
Lymphs Abs: 1.5 10*3/uL (ref 0.7–4.0)
MCH: 25.4 pg — ABNORMAL LOW (ref 26.0–34.0)
MCHC: 30.7 g/dL (ref 30.0–36.0)
MCV: 82.7 fL (ref 80.0–100.0)
Monocytes Absolute: 0.4 10*3/uL (ref 0.1–1.0)
Monocytes Relative: 5 %
Neutro Abs: 6.1 10*3/uL (ref 1.7–7.7)
Neutrophils Relative %: 75 %
Platelets: 195 10*3/uL (ref 150–400)
RBC: 4.05 MIL/uL — ABNORMAL LOW (ref 4.22–5.81)
RDW: 13.7 % (ref 11.5–15.5)
WBC: 8.1 10*3/uL (ref 4.0–10.5)
nRBC: 0 % (ref 0.0–0.2)

## 2019-06-17 LAB — GLUCOSE, CAPILLARY
Glucose-Capillary: 122 mg/dL — ABNORMAL HIGH (ref 70–99)
Glucose-Capillary: 105 mg/dL — ABNORMAL HIGH (ref 70–99)
Glucose-Capillary: 217 mg/dL — ABNORMAL HIGH (ref 70–99)
Glucose-Capillary: 131 mg/dL — ABNORMAL HIGH (ref 70–99)

## 2019-06-17 LAB — BASIC METABOLIC PANEL
Anion gap: 8 (ref 5–15)
BUN: 23 mg/dL (ref 8–23)
CO2: 25 mmol/L (ref 22–32)
Calcium: 9.3 mg/dL (ref 8.9–10.3)
Chloride: 108 mmol/L (ref 98–111)
Creatinine, Ser: 1.59 mg/dL — ABNORMAL HIGH (ref 0.61–1.24)
GFR calc Af Amer: 51 mL/min — ABNORMAL LOW (ref >60)
GFR calc non Af Amer: 44 mL/min — ABNORMAL LOW (ref >60)
Glucose, Bld: 239 mg/dL — ABNORMAL HIGH (ref 70–99)
Potassium: 4.7 mmol/L (ref 3.5–5.1)
Sodium: 141 mmol/L (ref 135–145)

## 2019-06-17 LAB — VITAMIN B12: Vitamin B-12: 177 pg/mL — ABNORMAL LOW (ref 180–914)

## 2019-06-17 LAB — HEMOGLOBIN A1C
Hgb A1c MFr Bld: 10.9 % — ABNORMAL HIGH (ref 4.8–5.6)
Mean Plasma Glucose: 266 mg/dL

## 2019-06-17 LAB — RETICULOCYTES
Immature Retic Fract: 5.3 % (ref 2.3–15.9)
RBC.: 4.07 MIL/uL — ABNORMAL LOW (ref 4.22–5.81)
Retic Count, Absolute: 36.2 10*3/uL (ref 19.0–186.0)
Retic Ct Pct: 0.9 % (ref 0.4–3.1)

## 2019-06-17 LAB — FOLATE: Folate: 8 ng/mL (ref >5.9)

## 2019-06-17 LAB — LIPASE, BLOOD: Lipase: 33 U/L (ref 11–51)

## 2019-06-17 LAB — FERRITIN: Ferritin: 21 ng/mL — ABNORMAL LOW (ref 24–336)

## 2019-06-17 LAB — AMYLASE: Amylase: 136 U/L — ABNORMAL HIGH (ref 28–100)

## 2019-06-17 MED ORDER — ONDANSETRON HCL 4 MG PO TABS
8.0000 mg | ORAL_TABLET | Freq: Three times a day (TID) | ORAL | Status: DC | PRN
  Administered 2019-06-17: 18:00:00 8 mg via ORAL
  Filled 2019-06-17: qty 2

## 2019-06-17 MED ORDER — METFORMIN HCL 850 MG PO TABS
850.0000 mg | ORAL_TABLET | Freq: Two times a day (BID) | ORAL | Status: DC
  Administered 2019-06-17 – 2019-06-19 (×5): 850 mg via ORAL
  Filled 2019-06-17 (×11): qty 1

## 2019-06-17 NOTE — Progress Notes (Signed)
[facility] MD Progress Note  06/17/2019 10:24 AM Michael [name]  MRN:  [id number] Subjective:  Patient is a 68 year old male who was admitted on 06/15/2019 through the [facility] emergency department with suicidal ideation.  He has a history of unspecified depression and cocaine dependence.  Objective: Patient is seen and examined.  Patient is a 68 year old male with the above-stated past medical and psychiatric history who is seen in follow-up.  From a psychiatric perspective he feels better.  He denied any suicidal ideation today.  He was seen by Occupational Therapy yesterday, and a cane or walker were recommended.  This was taken care of.  Additionally Occupational Therapy found out that he had been referred to a skilled nursing facility after his last medical hospitalization..  We discussed the fact this morning of the difficulty of trying to get him into a SNF unit from a psychiatric facility.  We discussed whether or not he could potentially contact his family and see if he could stay there temporarily until his medical team was able to get him into a skilled nursing facility.  He did complain of nausea this morning, and that may be secondary to the large dose of the metformin.  His vital signs are stable, he and he had a temperature of 99.3 today.  He did sleep 6.75 hours.  Principal Problem: <principal problem not specified> Diagnosis: Active Problems:   MDD (major depressive disorder), recurrent episode, severe ([place])  Total Time spent with patient: 30 minutes  Past Psychiatric History: See admission H&P  Past Medical History:  Past Medical History:  Diagnosis Date  . A-fib ([place])   . Asthma    No PFTs, history of childhood asthma  . CAD (coronary artery disease)    a. 06/2013 STEMI/PCI ([facility]): LAD w/ thrombus (treated with BMS), mid 75%, D2 75%; LCX OM2 75%; RCA small, PDA 95%, PLV 95%;  b. 10/2013 Cath/PCI: ISR w/in LAD (Promus DES x 2), borderline OM2 lesion;  c. 01/2014  MV: Intermediate risk, medium-sized distal ant wall infarct w/ very small amt of peri-infarct ischemia. EF 60%.  . Cellulitis 04/2014   left facial  . Chondromalacia of medial femoral condyle    Left knee MRI 04/28/12: Chondromalacia of the medial femoral condyle with slight peripheral degeneration of the meniscocapsular junction of the medial meniscus; followed by sports medicine  . Collagen vascular disease ([place])   . Crack cocaine use    for 20+ years, has been enrolled in detox programs in the past  . Depression    with history of hospitalization for suicidal ideation  . Diabetes mellitus 2002   Diagnosed in 2002, started insulin in 2012  . Gout   . Gout 04/28/2012  . Headache(784.0)    CT head 08/2011: Periventricular and subcortical white matter hypodensities are most in keeping with chronic microangiopathic change  . HIV infection [facility]) Nov 2012   Followed by [name]  . Hyperlipidemia   . Hypertension   . Pulmonary embolism [facility])     Past Surgical History:  Procedure Laterality Date  . BACK SURGERY     1988  . BOWEL RESECTION    . CARDIAC SURGERY    . CERVICAL SPINE SURGERY     " rods in my neck "  . CORONARY ARTERY BYPASS GRAFT    . CORONARY STENT PLACEMENT    . NM MYOCAR PERF WALL MOTION  12/27/2011   normal  . SPINE SURGERY     Family History:  Family History  Problem Relation Age of Onset  . Diabetes Mother   . Hypertension Mother   . Hyperlipidemia Mother   . Diabetes Father   . Cancer Father   . Hypertension Father   . Diabetes Brother   . Heart disease Brother   . Diabetes Sister   . Colon cancer Neg Hx    Family Psychiatric  History: See admission H&P Social History:  Social History   Substance and Sexual Activity  Alcohol Use No  . Alcohol/week: 4.0 standard drinks  . Types: 2 Cans of beer, 2 Shots of liquor per week     Social History   Substance and Sexual Activity  Drug Use Yes  . Types: "Crack" cocaine, Cocaine    Social History    Socioeconomic History  . Marital status: Widowed    Spouse name: Not on file  . Number of children: 2  . Years of education: 67  . Highest education level: 12th grade  Occupational History    Employer: UNEMPLOYED    Comment: 04/2016  Social Needs  . Financial resource strain: Somewhat hard  . Food insecurity    Worry: Sometimes true    Inability: Sometimes true  . Transportation needs    Medical: Yes    Non-medical: Yes  Tobacco Use  . Smoking status: Never Smoker  . Smokeless tobacco: Never Used  Substance and Sexual Activity  . Alcohol use: No    Alcohol/week: 4.0 standard drinks    Types: 2 Cans of beer, 2 Shots of liquor per week  . Drug use: Yes    Types: "Crack" cocaine, Cocaine  . Sexual activity: Yes    Comment: accepted condoms  Lifestyle  . Physical activity    Days per week: 0 days    Minutes per session: 0 min  . Stress: Very much  Relationships  . Social Herbalist on phone: Once a week    Gets together: Never    Attends religious service: Never    Active member of club or organization: No    Attends meetings of clubs or organizations: Never    Relationship status: Widowed  Other Topics Concern  . Not on file  Social History Narrative   Currently staying with a friend in [place].  Was staying @ local motel until a few days ago - left b/c of bed bugs.   Additional Social History:                         Sleep: Good  Appetite:  Fair  Current Medications: Current Facility-Administered Medications  Medication Dose Route Frequency Provider Last Rate Last Dose  . acetaminophen (TYLENOL) tablet 650 mg  650 mg Oral Q6H PRN Sharma Covert, MD      . albuterol (VENTOLIN HFA) 108 (90 Base) MCG/ACT inhaler 1-2 puff  1-2 puff Inhalation Q6H PRN Sharma Covert, MD      . allopurinol (ZYLOPRIM) tablet 300 mg  300 mg Oral Daily Sharma Covert, MD   300 mg at 06/17/19 0820  . alum & mag hydroxide-simeth (MAALOX/MYLANTA) 200-200-20  MG/5ML suspension 30 mL  30 mL Oral Q4H PRN Sharma Covert, MD      . aspirin chewable tablet 81 mg  81 mg Oral Daily Sharma Covert, MD   81 mg at 06/17/19 0818  . diltiazem (CARDIZEM CD) 24 hr capsule 120 mg  120 mg Oral Daily Lawan Nanez, Cordie Grice, MD  120 mg at 06/17/19 0819  . emtricitabine-rilpivir-tenofovir AF (ODEFSEY) 200-25-25 MG per tablet 1 tablet  1 tablet Oral Q breakfast Sharma Covert, MD   1 tablet at 06/17/19 [phone]  . famotidine (PEPCID) tablet 20 mg  20 mg Oral BID Sharma Covert, MD   20 mg at 06/17/19 0820  . fluticasone (FLOVENT HFA) 44 MCG/ACT inhaler 1 puff  1 puff Inhalation BID Sharma Covert, MD   1 puff at 06/17/19 0818  . gabapentin (NEURONTIN) capsule 400 mg  400 mg Oral BID Sharma Covert, MD   400 mg at 06/17/19 9201  . hydrOXYzine (ATARAX/VISTARIL) tablet 25 mg  25 mg Oral TID PRN Sharma Covert, MD   25 mg at 06/16/19 2115  . insulin aspart (novoLOG) injection 0-15 Units  0-15 Units Subcutaneous TID WC Sharma Covert, MD   2 Units at 06/17/19 [phone]  . insulin glargine (LANTUS) injection 20 Units  20 Units Subcutaneous QHS Sharma Covert, MD   20 Units at 06/16/19 2118  . magnesium hydroxide (MILK OF MAGNESIA) suspension 30 mL  30 mL Oral Daily PRN Sharma Covert, MD      . metFORMIN (GLUCOPHAGE) tablet 850 mg  850 mg Oral BID WC Sharma Covert, MD   850 mg at 06/17/19 0851  . nitroGLYCERIN (NITROSTAT) SL tablet 0.4 mg  0.4 mg Sublingual Q5 min PRN Sharma Covert, MD      . ondansetron [facility]) tablet 8 mg  8 mg Oral Q8H PRN Sharma Covert, MD      . polyethylene glycol (MIRALAX / GLYCOLAX) packet 17 g  17 g Oral Daily PRN Sharma Covert, MD      . sertraline (ZOLOFT) tablet 50 mg  50 mg Oral Daily Sharma Covert, MD   50 mg at 06/17/19 0819  . traZODone (DESYREL) tablet 100 mg  100 mg Oral QHS PRN Sharma Covert, MD   100 mg at 06/16/19 2115    Lab Results:  Results for orders placed or performed during  the hospital encounter of 06/15/19 (from the past 48 hour(s))  Glucose, capillary     Status: Abnormal   Collection Time: 06/15/19  5:39 PM  Result Value Ref Range   Glucose-Capillary 148 (H) 70 - 99 mg/dL   Comment 1 Notify RN    Comment 2 Document in Chart   Glucose, capillary     Status: Abnormal   Collection Time: 06/15/19  9:29 PM  Result Value Ref Range   Glucose-Capillary 187 (H) 70 - 99 mg/dL   Comment 1 Notify RN   Hemoglobin A1c     Status: Abnormal   Collection Time: 06/16/19  6:25 AM  Result Value Ref Range   Hgb A1c MFr Bld 10.9 (H) 4.8 - 5.6 %    Comment: (NOTE)         Prediabetes: 5.7 - 6.4         Diabetes: >6.4         Glycemic control for adults with diabetes: <7.0    Mean Plasma Glucose 266 mg/dL    Comment: (NOTE) Performed At: [facility] [place], [place] [id number] Rush Farmer MD PQ:[id number]   Lipid panel     Status: Abnormal   Collection Time: 06/16/19  6:25 AM  Result Value Ref Range   Cholesterol 195 0 - 200 mg/dL   Triglycerides 142 <150 mg/dL   HDL 43 >40 mg/dL   Total CHOL/HDL  Ratio 4.5 RATIO   VLDL 28 0 - 40 mg/dL   LDL Cholesterol 124 (H) 0 - 99 mg/dL    Comment:        Total Cholesterol/HDL:CHD Risk Coronary Heart Disease Risk Table                     Men   Women  1/2 Average Risk   3.4   3.3  Average Risk       5.0   4.4  2 X Average Risk   9.6   7.1  3 X Average Risk  23.4   11.0        Use the calculated Patient Ratio above and the CHD Risk Table to determine the patient's CHD Risk.        ATP III CLASSIFICATION (LDL):  <100     mg/dL   Optimal  100-129  mg/dL   Near or Above                    Optimal  130-159  mg/dL   Borderline  160-189  mg/dL   High  >190     mg/dL   Very High Performed at [facility] [address] [place], [place] [address]   TSH     Status: None   Collection Time: 06/16/19  6:25 AM  Result Value Ref Range   TSH 1.668 0.350 - 4.500 uIU/mL     Comment: Performed by a 3rd Generation assay with a functional sensitivity of <=0.01 uIU/mL. Performed at [facility], [place] [address], [place],  [address]   Glucose, capillary     Status: Abnormal   Collection Time: 06/16/19  6:25 AM  Result Value Ref Range   Glucose-Capillary 107 (H) 70 - 99 mg/dL  Glucose, capillary     Status: Abnormal   Collection Time: 06/16/19 11:57 AM  Result Value Ref Range   Glucose-Capillary 211 (H) 70 - 99 mg/dL   Comment 1 Notify RN    Comment 2 Document in Chart   Glucose, capillary     Status: Abnormal   Collection Time: 06/16/19  5:02 PM  Result Value Ref Range   Glucose-Capillary 168 (H) 70 - 99 mg/dL   Comment 1 Notify RN    Comment 2 Document in Chart   Glucose, capillary     Status: Abnormal   Collection Time: 06/16/19  8:42 PM  Result Value Ref Range   Glucose-Capillary 206 (H) 70 - 99 mg/dL  Glucose, capillary     Status: Abnormal   Collection Time: 06/17/19  6:05 AM  Result Value Ref Range   Glucose-Capillary 122 (H) 70 - 99 mg/dL   Comment 1 Notify RN    Comment 2 Document in Chart     Blood Alcohol level:  Lab Results  Component Value Date   ETH 17 (H) 06/11/2019   ETH <10 74/05/1447    Metabolic Disorder Labs: Lab Results  Component Value Date   HGBA1C 10.9 (H) 06/16/2019   MPG 266 06/16/2019   MPG 332 04/15/2019   No results found for: PROLACTIN Lab Results  Component Value Date   CHOL 195 06/16/2019   TRIG 142 06/16/2019   HDL 43 06/16/2019   CHOLHDL 4.5 06/16/2019   VLDL 28 06/16/2019   LDLCALC 124 (H) 06/16/2019   LDLCALC 110 (H) 05/04/2018    Physical Findings: AIMS: Facial and Oral Movements Muscles of Facial Expression: None, normal Lips  and Perioral Area: None, normal Jaw: None, normal Tongue: None, normal,Extremity Movements Upper (arms, wrists, hands, fingers): None, normal Lower (legs, knees, ankles, toes): None, normal, Trunk Movements Neck, shoulders, hips: None, normal,  Overall Severity Severity of abnormal movements (highest score from questions above): None, normal Incapacitation due to abnormal movements: None, normal Patient's awareness of abnormal movements (rate only patient's report): No Awareness, Dental Status Current problems with teeth and/or dentures?: No Does patient usually wear dentures?: No  CIWA:    COWS:     Musculoskeletal: Strength & Muscle Tone: decreased Gait & Station: shuffle Patient leans: N/A  Psychiatric Specialty Exam: Physical Exam  Nursing note and vitals reviewed. Constitutional: He is oriented to person, place, and time. He appears well-developed and well-nourished.  HENT:  Head: Normocephalic and atraumatic.  Respiratory: Effort normal.  Neurological: He is alert and oriented to person, place, and time.    ROS  Blood pressure 125/75, pulse (!) 104, temperature 99.3 F (37.4 C), temperature source Oral, resp. rate 18, height 5\' 8"  (1.727 m), weight 74.4 kg, SpO2 100 %.Body mass index is 24.94 kg/m.  General Appearance: Casual  Eye Contact:  Fair  Speech:  Normal Rate  Volume:  Decreased  Mood:  Euthymic  Affect:  Congruent  Thought Process:  Coherent and Descriptions of Associations: Intact  Orientation:  Full (Time, Place, and Person)  Thought Content:  Logical  Suicidal Thoughts:  No  Homicidal Thoughts:  No  Memory:  Immediate;   Fair Recent;   Fair Remote;   Fair  Judgement:  Intact  Insight:  Fair  Psychomotor Activity:  Decreased  Concentration:  Concentration: Fair and Attention Span: Fair  Recall:  [employer] of Knowledge:  Fair  Language:  Good  Akathisia:  Negative  Handed:  Right  AIMS (if indicated):     Assets:  Desire for Improvement Resilience  ADL's:  Impaired  Cognition:  WNL  Sleep:  Number of Hours: 6.75     Treatment Plan Summary: Daily contact with patient to assess and evaluate symptoms and progress in treatment, Medication management and Plan : Patient is seen and  examined.  Patient is a 68 year old male with the above-stated past psychiatric history who is seen in follow-up.   Diagnosis: #1 substance-induced mood disorder versus major depression, recurrent, severe without psychotic features, #2 cocaine dependence, #3 coronary artery disease, #4 diabetes mellitus type 2, #5 chronic renal disease stage III, #6 HIV, #7 essential hypertension, #8 history of pulmonary emboli, #9 gout, #10 chondromalacia, #11 nausea  Patient is seen in follow-up.  From a psychiatric perspective he is improving, but medically he has multiple problems, and is a little nauseated this morning.  That could be due to a multitude of things.  I suspect is from the high dose of the metformin.  I am going to decrease his dosage to 850 mg p.o. twice daily.  We will continue the Lantus insulin.  If necessary we will increase his dosage.  His blood sugar this morning was 122.  Given his multiple medical problems as well as his medications I will go on and order amylase, lipase, liver function enzymes as well as a basic metabolic panel to look for possible pancreatitis as well as reassessing his liver function and his creatinine.  His vital signs are stable, he is afebrile.  No other changes in his medicines.  Social work is continuing to work on some kind of disposition, but placement may be difficult.  He also is  mildly anemic which could be a combination of nutritional status as well as his renal failure.  We will recheck his hemoglobin and hematocrit today, and I am going to go on and order an iron study as well. 1.  Continue albuterol HFA 1 to 2 puffs every 6 hours as needed wheezing. 2.  Continue allopurinol 300 mg p.o. daily for gout. 3.  Continue coated aspirin 81 mg p.o. daily for heart disease. 4.  Continue diltiazem CD 124 mg p.o. daily for blood pressure as well as rate control. 5.  Continue Odefsey for HIV disease. 6.  Continue Pepcid 20 mg p.o. twice daily for GERD. 7.  Continue Flovent  HFA 1 puff twice daily for wheezing and shortness of breath. 8.  Continue Neurontin 400 mg p.o. twice daily for peripheral neuropathy and chronic pain. 9.  Continue sliding scale insulin for diabetes mellitus. 10.  Continue Lantus insulin 20 units subcu nightly for diabetes mellitus. 11.    Decrease Metformin to 850 mg p.o. twice daily with food for diabetes mellitus. 12.  Continue nitroglycerin sublingual 0.4 mg as needed chest pain. 13.  Continue MiraLAX 17 g in 8 ounces water as needed for constipation. 14.  Continue sertraline 50 mg p.o. daily for depression and anxiety. 15.  Continue trazodone 100 mg p.o. nightly as needed insomnia. 89.    Order basic metabolic panel, liver function enzymes, amylase, lipase, CBC with differential as well as iron studies. 17.  Disposition planning-in progress.  Sharma Covert, MD 06/17/2019, 10:24 AM

## 2019-06-17 NOTE — Plan of Care (Signed)
LCSW Greg faxed referral to [facility] on patient behalf. Fax sent successfully at 14:35.

## 2019-06-17 NOTE — Progress Notes (Signed)
D:  Patient's self inventory sheet, patient has fair to poor sleep, sleep medication helpful.  Poor appetite, low energy level, poor concentration.  Withdrawals, runny nose.  Does have SI thoughts.  Contracts for safety.  Physical problems, lightheaded, pain, dizziness, headaches, blurred vision.  Physical pain, worst pain #8 in past 24 hours.  No pain medicine.  Goal is work on problems.  No discharge plans. A:  Medications administered per MD orders.  Emotional support and encouragement given patient. R:  Denied SI and HI when talking to RN today.  Denied A/V hallucinations.  Safety maintained with 15 minute checks.

## 2019-06-17 NOTE — Progress Notes (Signed)
D: Pt was in his room upon initial approach.  Pt presents with depressed, anxious affect and mood.  He states "I feel better than I did earlier."  His goal is to "get a good nights sleep."  Pt denies SI/HI, denies hallucinations, reports R shoulder pain of 7/10.  Pt has been visible in milieu intermittently with few peer interactions.    A: Introduced self to pt.  Met with pt 1:1.  Actively listened to pt and offered support and encouragement.  Medications administered per order.  PRN medication administered for sleep, pain.  15 minute safety checks in place.  R: Pt is safe on the unit.  Pt is compliant with medications.  Pt verbally contracts for safety.  Will continue to monitor and assess.   

## 2019-06-17 NOTE — Plan of Care (Signed)
Nurse discussed anxiety, depression and coping skills with patient.  

## 2019-06-17 NOTE — [facility] Group Notes (Signed)
[place] Group Notes:  (Nursing/MHT/Case Management/Adjunct)  Date:  06/17/2019  Time:  10:00 AM  Type of Therapy:  Nurse Education  Participation Level:  Active  Participation Quality:  Appropriate, Attentive and Drowsy  Affect:  Appropriate  Cognitive:  Alert and Appropriate  Insight:  Appropriate and Good  Engagement in Group:  Engaged  Modes of Intervention:  Discussion, Education, Exploration and Support  Summary of Progress/Problems:  pt's discussed crisis management and how their goal was focused towards this. Pt's discussed resources and coping skills that could be utilized if another crisis arises. Pt's discussed triggers and how to recognize these in the future. Lastly, pt's were encouraged to fill out their suicide safety plan for discharge. Pt discussed his goal for today to keep progressing and move away from his crisis episode. Pt also had multiple questions regarding his medical care which were answered or referred to the MD.  Michael [name] 06/17/2019, 12:04 PM

## 2019-06-17 NOTE — Progress Notes (Signed)
Psychoeducational Group Note  Date:  06/17/2019 Time:  2108  Group Topic/Focus:  Wrap-Up Group:   The focus of this group is to help patients review their daily goal of treatment and discuss progress on daily workbooks.  Participation Level: Did Not Attend  Participation Quality:  Not Applicable  Affect:  Not Applicable  Cognitive:  Not Applicable  Insight:  Not Applicable  Engagement in Group: Not Applicable  Additional Comments:  The patient did not attend group this evening since he was asleep in his bedroom.   Michael [name] S 06/17/2019, 9:08 PM

## 2019-06-17 NOTE — Progress Notes (Signed)
DAR NOTE: Pt present with flat affect and calm mood in the unit. Pt has been isolating himself, not interacting much. Pt denies physical pain, took all his meds as scheduled. Pt's safety ensured with 15 minute and environmental checks. Pt currently denies SI/HI and A/V hallucinations. Pt verbally agrees to seek staff if SI/HI or A/VH occurs and to consult with staff before acting on these thoughts. Will continue POC.

## 2019-06-18 LAB — GLUCOSE, CAPILLARY
Glucose-Capillary: 72 mg/dL (ref 70–99)
Glucose-Capillary: 155 mg/dL — ABNORMAL HIGH (ref 70–99)
Glucose-Capillary: 109 mg/dL — ABNORMAL HIGH (ref 70–99)
Glucose-Capillary: 182 mg/dL — ABNORMAL HIGH (ref 70–99)
Glucose-Capillary: 237 mg/dL — ABNORMAL HIGH (ref 70–99)

## 2019-06-18 MED ORDER — FOLIC ACID 1 MG PO TABS
1.0000 mg | ORAL_TABLET | Freq: Every day | ORAL | Status: DC
  Administered 2019-06-18 – 2019-06-19 (×2): 1 mg via ORAL
  Filled 2019-06-18 (×4): qty 1

## 2019-06-18 MED ORDER — CYANOCOBALAMIN 1000 MCG/ML IJ SOLN
1000.0000 ug | Freq: Once | INTRAMUSCULAR | Status: AC
  Administered 2019-06-18: 1000 ug via INTRAMUSCULAR
  Filled 2019-06-18: qty 1

## 2019-06-18 MED ORDER — FERROUS SULFATE 325 (65 FE) MG PO TABS
325.0000 mg | ORAL_TABLET | Freq: Two times a day (BID) | ORAL | Status: DC
  Administered 2019-06-18 – 2019-06-19 (×2): 325 mg via ORAL
  Filled 2019-06-18 (×6): qty 1

## 2019-06-18 MED ORDER — SODIUM CHLORIDE 0.9 % IV SOLN: 510.0000 mg | Freq: Once | INTRAVENOUS | Status: DC

## 2019-06-18 MED ORDER — VITAMIN B-1 100 MG PO TABS
100.0000 mg | ORAL_TABLET | Freq: Every day | ORAL | Status: DC
  Administered 2019-06-18 – 2019-06-19 (×2): 100 mg via ORAL
  Filled 2019-06-18 (×4): qty 1

## 2019-06-18 NOTE — Progress Notes (Signed)
Michael [name] [facility] MD Progress Note  06/18/2019 2:01 PM [name]  MRN:  [id number] Subjective:  Patient reports some improvement but states he remains depressed , anxious, which he attributes in part to concerns regarding homelessness and placement. Endorses intermittent passive SI, denies suicidal plan or intention and contracts for safety on unit. Denies medication side effects at this time . e.  Objective: I have discussed case with treatment team and have met with patient.  Patient is a 68 year old male who was admitted on 06/15/2019 through the [facility] emergency department with suicidal ideation.  He has a history of unspecified depression and cocaine dependence. Patient reports he is feeling better, although endorses some persistent depression and anxiety, mainly related to significant psychosocial stressors ( homelessness, limited support network). He denies suicidal plan or intention but does endorse intermittent passive SI related to above stressors, mainly homelessness. Contracts for safety on unit. Denies medication side effects at this time. Behavior on unit in good control, polite on approach. Going to some groups . Labs reviewed - Creatinine improved from 1.86 to 1.59, labs suggestive of iron deficiency anemia.  Mildly low Vitamin B 12  Principal Problem: MDD Diagnosis: Active Problems:   MDD (major depressive disorder), recurrent episode, severe ([place])  Total Time spent with patient: 20 minutes  Past Psychiatric History: See admission H&P  Past Medical History:  Past Medical History:  Diagnosis Date  . A-fib ([place])   . Asthma    No PFTs, history of childhood asthma  . CAD (coronary artery disease)    a. 06/2013 STEMI/PCI ([facility]): LAD w/ thrombus (treated with BMS), mid 75%, D2 75%; LCX OM2 75%; RCA small, PDA 95%, PLV 95%;  b. 10/2013 Cath/PCI: ISR w/in LAD (Promus DES x 2), borderline OM2 lesion;  c. 01/2014 MV: Intermediate risk, medium-sized distal ant wall  infarct w/ very small amt of peri-infarct ischemia. EF 60%.  . Cellulitis 04/2014   left facial  . Chondromalacia of medial femoral condyle    Left knee MRI 04/28/12: Chondromalacia of the medial femoral condyle with slight peripheral degeneration of the meniscocapsular junction of the medial meniscus; followed by sports medicine  . Collagen vascular disease ([place])   . Crack cocaine use    for 20+ years, has been enrolled in detox programs in the past  . Depression    with history of hospitalization for suicidal ideation  . Diabetes mellitus 2002   Diagnosed in 2002, started insulin in 2012  . Gout   . Gout 04/28/2012  . Headache(784.0)    CT head 08/2011: Periventricular and subcortical white matter hypodensities are most in keeping with chronic microangiopathic change  . HIV infection [facility]) Nov 2012   Followed by [name]  . Hyperlipidemia   . Hypertension   . Pulmonary embolism [facility])     Past Surgical History:  Procedure Laterality Date  . BACK SURGERY     1988  . BOWEL RESECTION    . CARDIAC SURGERY    . CERVICAL SPINE SURGERY     " rods in my neck "  . CORONARY ARTERY BYPASS GRAFT    . CORONARY STENT PLACEMENT    . NM MYOCAR PERF WALL MOTION  12/27/2011   normal  . SPINE SURGERY     Family History:  Family History  Problem Relation Age of Onset  . Diabetes Mother   . Hypertension Mother   . Hyperlipidemia Mother   . Diabetes Father   . Cancer Father   .  Hypertension Father   . Diabetes Brother   . Heart disease Brother   . Diabetes Sister   . Colon cancer Neg Hx    Family Psychiatric  History: See admission H&P Social History:  Social History   Substance and Sexual Activity  Alcohol Use No  . Alcohol/week: 4.0 standard drinks  . Types: 2 Cans of beer, 2 Shots of liquor per week     Social History   Substance and Sexual Activity  Drug Use Yes  . Types: "Crack" cocaine, Cocaine    Social History   Socioeconomic History  . Marital status: Widowed     Spouse name: Not on file  . Number of children: 2  . Years of education: 35  . Highest education level: 12th grade  Occupational History    Employer: UNEMPLOYED    Comment: 04/2016  Social Needs  . Financial resource strain: Somewhat hard  . Food insecurity    Worry: Sometimes true    Inability: Sometimes true  . Transportation needs    Medical: Yes    Non-medical: Yes  Tobacco Use  . Smoking status: Never Smoker  . Smokeless tobacco: Never Used  Substance and Sexual Activity  . Alcohol use: No    Alcohol/week: 4.0 standard drinks    Types: 2 Cans of beer, 2 Shots of liquor per week  . Drug use: Yes    Types: "Crack" cocaine, Cocaine  . Sexual activity: Yes    Comment: accepted condoms  Lifestyle  . Physical activity    Days per week: 0 days    Minutes per session: 0 min  . Stress: Very much  Relationships  . Social Herbalist on phone: Once a week    Gets together: Never    Attends religious service: Never    Active member of club or organization: No    Attends meetings of clubs or organizations: Never    Relationship status: Widowed  Other Topics Concern  . Not on file  Social History Narrative   Currently staying with a friend in [place].  Was staying @ local motel until a few days ago - left b/c of bed bugs.   Additional Social History:    Sleep: Good  Appetite:  Fair  Current Medications: Current Facility-Administered Medications  Medication Dose Route Frequency Provider Last Rate Last Dose  . acetaminophen (TYLENOL) tablet 650 mg  650 mg Oral Q6H PRN Sharma Covert, MD   650 mg at 06/17/19 2104  . albuterol (VENTOLIN HFA) 108 (90 Base) MCG/ACT inhaler 1-2 puff  1-2 puff Inhalation Q6H PRN Sharma Covert, MD   1 puff at 06/18/19 0817  . allopurinol (ZYLOPRIM) tablet 300 mg  300 mg Oral Daily Sharma Covert, MD   300 mg at 06/18/19 0815  . alum & mag hydroxide-simeth (MAALOX/MYLANTA) 200-200-20 MG/5ML suspension 30 mL  30 mL Oral Q4H PRN  Sharma Covert, MD      . aspirin chewable tablet 81 mg  81 mg Oral Daily Sharma Covert, MD   81 mg at 06/18/19 0814  . diltiazem (CARDIZEM CD) 24 hr capsule 120 mg  120 mg Oral Daily Sharma Covert, MD   120 mg at 06/18/19 0815  . emtricitabine-rilpivir-tenofovir AF (ODEFSEY) 200-25-25 MG per tablet 1 tablet  1 tablet Oral Q breakfast Sharma Covert, MD   1 tablet at 06/18/19 [phone]  . famotidine (PEPCID) tablet 20 mg  20 mg Oral BID Sharma Covert,  MD   20 mg at 06/18/19 0815  . ferrous sulfate tablet 325 mg  325 mg Oral BID WC Sharma Covert, MD      . fluticasone (FLOVENT HFA) 44 MCG/ACT inhaler 1 puff  1 puff Inhalation BID Sharma Covert, MD   1 puff at 06/18/19 0818  . folic acid (FOLVITE) tablet 1 mg  1 mg Oral Daily Sharma Covert, MD   1 mg at 06/18/19 1220  . gabapentin (NEURONTIN) capsule 400 mg  400 mg Oral BID Sharma Covert, MD   400 mg at 06/18/19 4580  . hydrOXYzine (ATARAX/VISTARIL) tablet 25 mg  25 mg Oral TID PRN Sharma Covert, MD   25 mg at 06/16/19 2115  . insulin aspart (novoLOG) injection 0-15 Units  0-15 Units Subcutaneous TID WC Sharma Covert, MD   3 Units at 06/18/19 0300  . insulin glargine (LANTUS) injection 20 Units  20 Units Subcutaneous QHS Sharma Covert, MD   20 Units at 06/17/19 2105  . magnesium hydroxide (MILK OF MAGNESIA) suspension 30 mL  30 mL Oral Daily PRN Sharma Covert, MD      . metFORMIN (GLUCOPHAGE) tablet 850 mg  850 mg Oral BID WC Sharma Covert, MD   850 mg at 06/18/19 0815  . nitroGLYCERIN (NITROSTAT) SL tablet 0.4 mg  0.4 mg Sublingual Q5 min PRN Sharma Covert, MD      . ondansetron [facility]) tablet 8 mg  8 mg Oral Q8H PRN Sharma Covert, MD   8 mg at 06/17/19 1752  . polyethylene glycol (MIRALAX / GLYCOLAX) packet 17 g  17 g Oral Daily PRN Sharma Covert, MD      . sertraline (ZOLOFT) tablet 50 mg  50 mg Oral Daily Sharma Covert, MD   50 mg at 06/18/19 0815  . thiamine  (VITAMIN B-1) tablet 100 mg  100 mg Oral Daily Sharma Covert, MD   100 mg at 06/18/19 1219  . traZODone (DESYREL) tablet 100 mg  100 mg Oral QHS PRN Sharma Covert, MD   100 mg at 06/17/19 2104    Lab Results:  Results for orders placed or performed during the hospital encounter of 06/15/19 (from the past 48 hour(s))  Glucose, capillary     Status: Abnormal   Collection Time: 06/16/19  5:02 PM  Result Value Ref Range   Glucose-Capillary 168 (H) 70 - 99 mg/dL   Comment 1 Notify RN    Comment 2 Document in Chart   Glucose, capillary     Status: Abnormal   Collection Time: 06/16/19  8:42 PM  Result Value Ref Range   Glucose-Capillary 206 (H) 70 - 99 mg/dL  Glucose, capillary     Status: Abnormal   Collection Time: 06/17/19  6:05 AM  Result Value Ref Range   Glucose-Capillary 122 (H) 70 - 99 mg/dL   Comment 1 Notify RN    Comment 2 Document in Chart   Glucose, capillary     Status: Abnormal   Collection Time: 06/17/19 11:59 AM  Result Value Ref Range   Glucose-Capillary 105 (H) 70 - 99 mg/dL  Glucose, capillary     Status: Abnormal   Collection Time: 06/17/19  5:03 PM  Result Value Ref Range   Glucose-Capillary 217 (H) 70 - 99 mg/dL  Basic metabolic panel     Status: Abnormal   Collection Time: 06/17/19  6:20 PM  Result Value Ref Range   Sodium 141  135 - 145 mmol/L   Potassium 4.7 3.5 - 5.1 mmol/L   Chloride 108 98 - 111 mmol/L   CO2 25 22 - 32 mmol/L   Glucose, Bld 239 (H) 70 - 99 mg/dL   BUN 23 8 - 23 mg/dL   Creatinine, Ser 1.59 (H) 0.61 - 1.24 mg/dL   Calcium 9.3 8.9 - 10.3 mg/dL   GFR calc non Af Amer 44 (L) >60 mL/min   GFR calc Af Amer 51 (L) >60 mL/min   Anion gap 8 5 - 15    Comment: Performed at [facility], [place] [address] [place], [place] [address]  Amylase     Status: Abnormal   Collection Time: 06/17/19  6:20 PM  Result Value Ref Range   Amylase 136 (H) 28 - 100 U/L    Comment: Performed at [facility], [place]  [address] [place], [place] [address]  Lipase, blood     Status: None   Collection Time: 06/17/19  6:20 PM  Result Value Ref Range   Lipase 33 11 - 51 U/L    Comment: Performed at [facility], [place] [address] [place], [place] [address]  Hepatic function panel     Status: None   Collection Time: 06/17/19  6:20 PM  Result Value Ref Range   Total Protein 7.6 6.5 - 8.1 g/dL   Albumin 3.7 3.5 - 5.0 g/dL   AST 15 15 - 41 U/L   ALT 11 0 - 44 U/L   Alkaline Phosphatase 57 38 - 126 U/L   Total Bilirubin 0.3 0.3 - 1.2 mg/dL   Bilirubin, Direct <0.1 0.0 - 0.2 mg/dL   Indirect Bilirubin NOT CALCULATED 0.3 - 0.9 mg/dL    Comment: Performed at [facility], [place] [address] [place], [place] [address]  CBC with Differential/Platelet     Status: Abnormal   Collection Time: 06/17/19  6:20 PM  Result Value Ref Range   WBC 8.1 4.0 - 10.5 K/uL   RBC 4.05 (L) 4.22 - 5.81 MIL/uL   Hemoglobin 10.3 (L) 13.0 - 17.0 g/dL   HCT 33.5 (L) 39.0 - 52.0 %   MCV 82.7 80.0 - 100.0 fL   MCH 25.4 (L) 26.0 - 34.0 pg   MCHC 30.7 30.0 - 36.0 g/dL   RDW 13.7 11.5 - 15.5 %   Platelets 195 150 - 400 K/uL   nRBC 0.0 0.0 - 0.2 %   Neutrophils Relative % 75 %   Neutro Abs 6.1 1.7 - 7.7 K/uL   Lymphocytes Relative 19 %   Lymphs Abs 1.5 0.7 - 4.0 K/uL   Monocytes Relative 5 %   Monocytes Absolute 0.4 0.1 - 1.0 K/uL   Eosinophils Relative 1 %   Eosinophils Absolute 0.1 0.0 - 0.5 K/uL   Basophils Relative 0 %   Basophils Absolute 0.0 0.0 - 0.1 K/uL   Immature Granulocytes 0 %   Abs Immature Granulocytes 0.03 0.00 - 0.07 K/uL    Comment: Performed at [facility], [place] [address], [place], [place] [address]  Vitamin B12     Status: Abnormal   Collection Time: 06/17/19  6:20 PM  Result Value Ref Range   Vitamin B-12 177 (L) 180 - 914 pg/mL    Comment: (NOTE) This assay is not validated for testing neonatal or myeloproliferative syndrome specimens for Vitamin B12  levels. Performed at [facility], [place] [address], [place], [place] [address]  Folate     Status: None   Collection Time: 06/17/19  6:20 PM  Result Value Ref Range   Folate 8.0 >5.9 ng/mL    Comment: Performed at [facility], [place] [address] [place], [place] [address]  Iron and TIBC     Status: Abnormal   Collection Time: 06/17/19  6:20 PM  Result Value Ref Range   Iron 26 (L) 45 - 182 ug/dL   TIBC 428 250 - 450 ug/dL   Saturation Ratios 6 (L) 17.9 - 39.5 %   UIBC 402 ug/dL    Comment: Performed at [facility], [place] [address] [place], [place] [address]  Ferritin     Status: Abnormal   Collection Time: 06/17/19  6:20 PM  Result Value Ref Range   Ferritin 21 (L) 24 - 336 ng/mL    Comment: Performed at [facility], [place] [address], [place], [place] [address]  Reticulocytes     Status: Abnormal   Collection Time: 06/17/19  6:20 PM  Result Value Ref Range   Retic Ct Pct 0.9 0.4 - 3.1 %   RBC. 4.07 (L) 4.22 - 5.81 MIL/uL   Retic Count, Absolute 36.2 19.0 - 186.0 K/uL   Immature Retic Fract 5.3 2.3 - 15.9 %    Comment: Performed at [facility], [place] [address] [place], [place] [address]  Glucose, capillary     Status: Abnormal   Collection Time: 06/17/19  8:31 PM  Result Value Ref Range   Glucose-Capillary 131 (H) 70 - 99 mg/dL  Glucose, capillary     Status: None   Collection Time: 06/18/19  6:17 AM  Result Value Ref Range   Glucose-Capillary 72 70 - 99 mg/dL  Glucose, capillary     Status: Abnormal   Collection Time: 06/18/19 12:06 PM  Result Value Ref Range   Glucose-Capillary 155 (H) 70 - 99 mg/dL   Comment 1 Notify RN    Comment 2 Document in Chart     Blood Alcohol level:  Lab Results  Component Value Date   ETH 17 (H) 06/11/2019   ETH <10 93/23/5573    Metabolic Disorder Labs: Lab Results  Component Value Date   HGBA1C 10.9 (H) 06/16/2019   MPG 266 06/16/2019    MPG 332 04/15/2019   No results found for: PROLACTIN Lab Results  Component Value Date   CHOL 195 06/16/2019   TRIG 142 06/16/2019   HDL 43 06/16/2019   CHOLHDL 4.5 06/16/2019   VLDL 28 06/16/2019   LDLCALC 124 (H) 06/16/2019   LDLCALC 110 (H) 05/04/2018    Physical Findings: AIMS: Facial and Oral Movements Muscles of Facial Expression: None, normal Lips and Perioral Area: None, normal Jaw: None, normal Tongue: None, normal,Extremity Movements Upper (arms, wrists, hands, fingers): None, normal Lower (legs, knees, ankles, toes): None, normal, Trunk Movements Neck, shoulders, hips: None, normal, Overall Severity Severity of abnormal movements (highest score from questions above): None, normal Incapacitation due to abnormal movements: None, normal Patient's awareness of abnormal movements (rate only patient's report): No Awareness, Dental Status Current problems with teeth and/or dentures?: No Does patient usually wear dentures?: No  CIWA:  CIWA-Ar Total: 1 COWS:     Musculoskeletal: Strength & Muscle Tone: decreased Gait & Station: shuffle Patient leans: N/A  Psychiatric Specialty Exam: Physical Exam  Nursing note and vitals reviewed. Constitutional: He is oriented to person, place, and time. He appears well-developed and well-nourished.  HENT:  Head: Normocephalic and  atraumatic.  Respiratory: Effort normal.  Neurological: He is alert and oriented to person, place, and time.    ROS denies chest pain or shortness of breath, no vomiting   Blood pressure 139/78, pulse 79, temperature 99.3 F (37.4 C), temperature source Oral, resp. rate 18, height _0  (1.727 m), weight 74.4 kg, SpO2 100 %.Body mass index is 24.94 kg/m.  General Appearance: Casual  Eye Contact:  Good  Speech:  Normal Rate  Volume:  Decreased  Mood:  reports still feeling depressed   Affect:  appropriate, reactive   Thought Process:  Linear and Descriptions of Associations: Intact  Orientation:   Other:  fully alert and attentive   Thought Content:  no hallucinations, no delusions  Suicidal Thoughts:  Yes.  without intent/plan contracts for safety on unit, denies homicidal ideations  Homicidal Thoughts:  No  Memory:  recent and remote grossly intact   Judgement:  Fair/ improving   Insight:  Fair  Psychomotor Activity:  Decreased  Concentration:  Concentration: Fair and Attention Span: Fair  Recall:  [employer] of Knowledge:  Fair  Language:  Good  Akathisia:  Negative  Handed:  Right  AIMS (if indicated):     Assets:  Desire for Improvement Resilience  ADL's:  Impaired  Cognition:  WNL  Sleep:  Number of Hours: 6.75   Assessment-  Patient is a 68 year old male who was admitted on 06/15/2019 through the [facility] emergency department with suicidal ideation.  He has a history of unspecified depression and cocaine dependence. Today reports some improvement compared to admission but describes still feeling depressed, anxious and having intermittent passive SI related to his stressors, mainly homelessness.  Treatment Plan Summary: Treatment Plan reviewed as below today 9/11 Encourage abstinence , sobriety efforts Encourage group and milieu participation Treatment team working on disposition planning options - placement options being considered . 1.  Continue albuterol HFA 1 to 2 puffs every 6 hours as needed wheezing. 2.  Continue allopurinol 300 mg p.o. daily for gout. 3.  Continue coated aspirin 81 mg p.o. daily for heart disease. 4.  Continue diltiazem CD 124 mg p.o. daily for blood pressure as well as rate control. 5.  Continue Odefsey for HIV disease. 6.  Continue Pepcid 20 mg p.o. twice daily for GERD. 7.  Continue Flovent HFA 1 puff twice daily for wheezing and shortness of breath. 8.  Continue Neurontin 400 mg p.o. twice daily for peripheral neuropathy and chronic pain. 9.  Continue sliding scale insulin for diabetes mellitus. 10.  Continue Lantus  insulin 20 units subcu nightly for diabetes mellitus. 11. Continue Metformin  850 mg p.o. twice daily with food for diabetes mellitus. 12.  Continue nitroglycerin sublingual 0.4 mg as needed chest pain. 13.  Continue MiraLAX 17 g in 8 ounces water as needed for constipation. 14.  Continue Sertraline 50 mg p.o. daily for depression and anxiety. 15.  Continue Trazodone 100 mg p.o. nightly as needed insomnia. I have reviewed labs with hospitalist consultant and will start B12 and Iron supplementation  Jenne Campus, MD 06/18/2019, 2:01 PM    Patient ID: Michael [name], male   DOB: 1951-03-17, 68 y.o.   MRN: [id number]

## 2019-06-18 NOTE — BHH Group Notes (Signed)
Pt did not attend wrap up group this evening. Pt was in bed resting.

## 2019-06-18 NOTE — Progress Notes (Signed)
  [facility] Adult Case Management Discharge Plan :  Will you be returning to the same living situation after discharge:  No. Going to residential treatment  At discharge, do you have transportation home?: No. [facility] provides transportation Do you have the ability to pay for your medications: Yes,  [employer] of information consent forms completed and in the chart;  Patient's signature needed at discharge.  Patient to Follow up at: Follow-up [place] for Infectious Disease Follow up on 07/01/2019.   Why: Appointment is Thursday, 9/24 at 11:30a. Please do not miss this appointment, if you can not make it please reschedule or cancel.  Please bring your current medications.  Contact information: [place]  [address] [place] [address] ph: [phone] fx: [phone]       [place] Follow up.   Why: You have been accepted for treatment on Saturday, 9/12.  Make sure to bring your belongings.  Contact information: [place] [address] [phone]           Next level of care provider has access to [place] and Suicide Prevention discussed: No. pt declined consent      Has patient been referred to the Quitline?: N/A patient is not a smoker  Patient has been referred for addiction treatment: Yes, [facility]   Billey Chang, Student-Social Work 06/18/2019, 3:10 PM

## 2019-06-18 NOTE — Progress Notes (Signed)
Recreation Therapy Notes  Date:  9.11.20 Time: 0930 Location: [address] Dayroom  Group Topic: Stress Management  Goal Area(s) Addresses:  Patient will identify positive stress management techniques. Patient will identify benefits of using stress management post d/c.  Behavioral Response:  Engaged  Intervention: Stress Management  Activity : Meditation    Education:  Stress Management, Discharge Planning.   Education Outcome: Acknowledges Education  Clinical Observations/Feedback:  Pt attended and participated in group session.    Victorino Sparrow, LRT/CTRS    Victorino Sparrow A 06/18/2019 12:18 PM

## 2019-06-18 NOTE — BHH Group Notes (Signed)
[facility] Group Notes:  (Nursing/MHT/Case Management/Adjunct)  Date:  06/18/2019  Time:  10:00 AM  Type of Therapy:  Nurse Education  Participation Level:  Minimal  Participation Quality:  Appropriate and Attentive  Affect:  Appropriate  Cognitive:  Alert and Appropriate  Insight:  Appropriate and Good  Engagement in Group:  Engaged and Lacking  Modes of Intervention:  Discussion, Education and Exploration  Summary of Progress/Problems: Pt's discussed their own setbacks in recovery with this admission and possibly past. Pt's discussed triggers that have been present before setbacks in recovery have occurred. Lastly, pt's discussed their goal for the day and how this and present coping skills can relate to progressing past this setback. Pt attended group but shared minimally. Pt shared their goal and how this was helping to progress their recovery.   Michael [name] 06/18/2019, 10:50 AM

## 2019-06-18 NOTE — Care Management (Signed)
CMA spoke with Michael [name] in Admissions at [facility]. Patient has been accepted for treatment for Saturday, 9/12. Transportation maybe provided for patient. Michael [name] will follow up with CMA with that information.   CMA has notified CSW, Marya Amsler.     Aryssa Rosamond Care Management Assistant  Email:[email or web address] Office: [phone]

## 2019-06-18 NOTE — Progress Notes (Signed)
D: Pt was in bed in his room upon initial approach.  Pt presents with depressed affect and mood.  His goal is to "get rest so I can get ready to go to [facility] tomorrow."  Pt reports he feels safe to discharge with this plan.  Pt denies SI/HI, denies hallucinations, denies pain.  Pt has been isolative to his room for the majority of the evening.  A: Introduced self to pt.  Met with pt 1:1.  Actively listened to pt and offered support and encouragement.  15 minute safety checks in place.  R: Pt is safe on the unit.  Pt verbally contracts for safety.  Will continue to monitor and assess.

## 2019-06-18 NOTE — Care Management (Signed)
CMA spoke with Admissions at [facility]. They accept patient's insurance [employer]).  CMA sent referral to [facility].    CMA will follow up with referral.    CMA will notify CSW, Marya Amsler.     Dawnn Nam Care Management Assistant  Email:[email or web address] Office: [phone]

## 2019-06-18 NOTE — Care Management (Addendum)
CMA spoke to June with Admissions at [facility]. Per June, patient has private insurance ([employer]) and that is not accepted at [employer].   Garey notified CSW, Marya Amsler.    CMA will contact [facility] regarding possible admission for patient.    Allysia Ingles Care Management Assistant  Email:[email or web address] Office: [phone]

## 2019-06-18 NOTE — Progress Notes (Signed)
Occupational Therapy Treatment Patient Details Name: Michael [name] MRN: [id number] DOB: 1951/03/15 Today's Date: 06/18/2019    History of present illness Patient is a 68 year old male who was admitted on 06/15/2019 through the [facility] emergency department with suicidal ideation.  He has a history of unspecified depression and cocaine dependence. Pt with recent admission for COVID 19 and executive function complications   OT comments  Issued AE kit, super soft theraputty and ball; left at nursing desk.  Pt needs reinforcement for LB dressing and HEP.  Pt very appreciative.  Follow Up Recommendations  Home health OT;Supervision/Assistance - 24 hour    Equipment Recommendations  3 in 1 bedside commode;Other (comment)    Recommendations for Other Services      Precautions / Restrictions Precautions Precautions: Fall       Mobility Bed Mobility                  Transfers Overall transfer level: Modified independent Equipment used: None                  Balance                                           ADL either performed or assessed with clinical judgement   ADL                                         General ADL Comments: practiced with reacher to doff sock and pick up item from floor.  Explained use for pants, but did not use for pants.  Pt used sock aide at supervision level and long shoehorn at supervision level.  Also issued long sponge, but only simulated use.  Items left at front desk as well as written HEP/ super soft putty and squeeze ball     Vision       Perception     Praxis      Cognition Arousal/Alertness: Awake/alert Behavior During Therapy: WFL for tasks assessed/performed Overall Cognitive Status: Within Functional Limits for tasks assessed                                 General Comments: will need reinforcement with written HEP      Pt with bil hand  weakness. Unable to adduct fingers; unable to oppose to 5th digit; strength 3- to 3+/5  Exercises Exercises: Other exercises Other Exercises Other Exercises: written HEP for putty to roll into cylinder, place in palm of his hand and pinch down length of putty alternating thumb with first 3 digits.  He is then to try to oppose thumb and little finger (which he cannot yet do).  Switch hands. Also 10 squeezes with ball bil 1-2 x a day.  Pt return demonstrated exercises with min cues   Shoulder Instructions       General Comments      Pertinent Vitals/ Pain       Pain Assessment: No/denies pain  Home Living  Prior Functioning/Environment              Frequency  Min 2X/week        Progress Toward Goals  OT Goals(current goals can now be found in the care plan section)  Progress towards OT goals: Progressing toward goals     Plan      Co-evaluation                 AM-PAC OT "6 Clicks" Daily Activity     Outcome Measure   Help from another person eating meals?: None Help from another person taking care of personal grooming?: None Help from another person toileting, which includes using toliet, bedpan, or urinal?: None Help from another person bathing (including washing, rinsing, drying)?: None Help from another person to put on and taking off regular upper body clothing?: None Help from another person to put on and taking off regular lower body clothing?: A Little 6 Click Score: 23    End of Session    OT Visit Diagnosis: Other abnormalities of gait and mobility (R26.89);Muscle weakness (generalized) (M62.81);Pain   Activity Tolerance Patient tolerated treatment well   Patient Left in bed(EOB)   Nurse Communication          Time: 0982-8675 OT Time Calculation (min): 25 min  Charges: OT General Charges $OT Visit: 1 Visit OT Treatments $Self Care/Home Management : 8-22 mins $Therapeutic  Exercise: 8-22 mins  Lesle Chris, OTR/L Acute Rehabilitation Services [phone] Hydro pager [phone] office 06/18/2019   [place] 06/18/2019, 1:43 PM

## 2019-06-19 LAB — GLUCOSE, CAPILLARY
Glucose-Capillary: 92 mg/dL (ref 70–99)
Glucose-Capillary: 137 mg/dL — ABNORMAL HIGH (ref 70–99)

## 2019-06-19 MED ORDER — INFLUENZA VAC A&B SA ADJ QUAD 0.5 ML IM PRSY
0.5000 mL | PREFILLED_SYRINGE | INTRAMUSCULAR | Status: DC
  Filled 2019-06-19: qty 0.5

## 2019-06-19 MED ORDER — FOLIC ACID 1 MG PO TABS: 1.0000 mg | ORAL_TABLET | Freq: Every day | ORAL | 0 refills | Status: DC

## 2019-06-19 MED ORDER — DILTIAZEM HCL ER COATED BEADS 120 MG PO CP24: 120.0000 mg | ORAL_CAPSULE | Freq: Every day | ORAL | Status: DC

## 2019-06-19 MED ORDER — METFORMIN HCL 850 MG PO TABS: 850.0000 mg | ORAL_TABLET | Freq: Two times a day (BID) | ORAL | 0 refills | Status: DC

## 2019-06-19 MED ORDER — EMTRICITAB-RILPIVIR-TENOFOV AF 200-25-25 MG PO TABS: 1.0000 | ORAL_TABLET | Freq: Every day | ORAL | Status: DC

## 2019-06-19 MED ORDER — FLUTICASONE PROPIONATE HFA 44 MCG/ACT IN AERO: 1.0000 | INHALATION_SPRAY | Freq: Two times a day (BID) | RESPIRATORY_TRACT | 12 refills | Status: DC

## 2019-06-19 MED ORDER — FERROUS SULFATE 325 (65 FE) MG PO TABS: 325.0000 mg | ORAL_TABLET | Freq: Two times a day (BID) | ORAL | 0 refills | Status: DC

## 2019-06-19 MED ORDER — ALLOPURINOL 300 MG PO TABS: 300.0000 mg | ORAL_TABLET | Freq: Every day | ORAL | 0 refills | Status: DC

## 2019-06-19 MED ORDER — SERTRALINE HCL 50 MG PO TABS: 50.0000 mg | ORAL_TABLET | Freq: Every day | ORAL | 0 refills | Status: DC

## 2019-06-19 MED ORDER — HYDROXYZINE HCL 25 MG PO TABS: 25.0000 mg | ORAL_TABLET | Freq: Three times a day (TID) | ORAL | 0 refills | Status: DC | PRN

## 2019-06-19 MED ORDER — THIAMINE HCL 100 MG PO TABS: 100.0000 mg | ORAL_TABLET | Freq: Every day | ORAL | 0 refills | Status: DC

## 2019-06-19 MED ORDER — GABAPENTIN 400 MG PO CAPS: 400.0000 mg | ORAL_CAPSULE | Freq: Two times a day (BID) | ORAL | 0 refills | Status: DC

## 2019-06-19 MED ORDER — INSULIN GLARGINE 100 UNIT/ML ~~LOC~~ SOLN: 20.0000 [IU] | Freq: Every day | SUBCUTANEOUS | 0 refills | Status: DC

## 2019-06-19 MED ORDER — FAMOTIDINE 20 MG PO TABS: 20.0000 mg | ORAL_TABLET | Freq: Two times a day (BID) | ORAL | 0 refills | Status: DC

## 2019-06-19 MED ORDER — NITROGLYCERIN 0.4 MG SL SUBL: 0.4000 mg | SUBLINGUAL_TABLET | SUBLINGUAL | 12 refills | Status: DC | PRN

## 2019-06-19 MED ORDER — POLYETHYLENE GLYCOL 3350 17 G PO PACK: 17.0000 g | PACK | Freq: Every day | ORAL | 0 refills | Status: DC | PRN

## 2019-06-19 MED ORDER — TRAZODONE HCL 100 MG PO TABS: 100.0000 mg | ORAL_TABLET | Freq: Every evening | ORAL | 0 refills | Status: DC | PRN

## 2019-06-19 MED ORDER — ASPIRIN 81 MG PO CHEW: 81.0000 mg | CHEWABLE_TABLET | Freq: Every day | ORAL | Status: DC

## 2019-06-19 MED ORDER — ALBUTEROL SULFATE HFA 108 (90 BASE) MCG/ACT IN AERS: 1.0000 | INHALATION_SPRAY | Freq: Four times a day (QID) | RESPIRATORY_TRACT | Status: DC | PRN

## 2019-06-19 NOTE — BHH Group Notes (Signed)
[facility] LCSW Group Therapy Note  Date/Time:    06/19/2019 10:00-11:00AM  Type of Therapy and Topic:  Group Therapy:  Healthy vs Unhealthy Coping Skills  Participation Level:  Active   Description of Group:  The focus of this group was to determine what unhealthy coping techniques typically are used by group members and what healthy coping techniques would be helpful in coping with various problems. Patients were guided in becoming aware of the differences between healthy and unhealthy coping techniques.  Facilitator led a discussion about Cognitive-Behavioral Therapy and the way in which the triangle of thoughts/feelings/actions interact.  Therapeutic Goals 1. Patients learned that coping is what human beings do all day long to deal with various situations in their lives 2. Patients defined and discussed healthy vs unhealthy coping techniques 3. Patients came to understand the interaction between thoughts, feelings, and actions and how changing one can change the others 4. Patients provided support and ideas to each other  Summary of Patient Progress: During group, patient expressed that he anticipates some challenges with adjusting to the [facility], their schedule and his treatment there.  He is looking forward to it, but knows that it is a new phase of life and therefore can present difficulties.     Therapeutic Modalities Cognitive Behavioral Therapy Motivational Interviewing   Selmer Dominion, LCSW 06/19/2019, 11:23 AM

## 2019-06-19 NOTE — Progress Notes (Signed)
  [facility] Adult Case Management Discharge Plan :  Will you be returning to the same living situation after discharge:  No.  Is going to a treatment facility for approximately 30 days. At discharge, do you have transportation home?: Yes,  [place] will be picking him up and transporting Do you have the ability to pay for your medications: Yes,  states he does not have any co-pay for his medications due to his insurance  Release of information consent forms completed and turned in to Medical Records by CSW.   Patient to Follow up at: [place] Follow up.   Why: You have been accepted for treatment on Saturday, 9/12.  Correct number is [phone].  Make sure to bring your belongings.  Contact information: [place] [address] [phone]           Next level of care provider has access to [place] and Suicide Prevention discussed: No.  Declined by patient, so done with him 1:1   Has patient been referred to the Quitline?: N/A because going to another facility  Patient has been referred for addiction treatment: Yes  Maretta Los, LCSW 06/19/2019, 11:35 AM

## 2019-06-19 NOTE — Discharge Summary (Addendum)
Physician Discharge Summary Note  Patient:  Michael [name] is an 68 y.o., male MRN:  [id number] DOB:  1951-07-04 Patient phone:  [phone] (home)  Patient address:   [address] [place] [address],  Total Time spent with patient: >30 minutes  Date of Admission:  06/15/2019 Date of Discharge: 06/19/2019  Reason for Admission:  Suicidal ideations in the context of depression and cocaine dependence  Principal Problem: MDD (major depressive disorder), recurrent episode, severe ([place]) Discharge Diagnoses: Principal Problem:   MDD (major depressive disorder), recurrent episode, severe ([place]) Active Problems:   Cocaine use disorder, severe, dependence ([place])   Past Psychiatric History: MDD recurrent  Past Medical History:  Past Medical History:  Diagnosis Date  . A-fib ([place])   . Asthma    No PFTs, history of childhood asthma  . CAD (coronary artery disease)    a. 06/2013 STEMI/PCI ([facility]): LAD w/ thrombus (treated with BMS), mid 75%, D2 75%; LCX OM2 75%; RCA small, PDA 95%, PLV 95%;  b. 10/2013 Cath/PCI: ISR w/in LAD (Promus DES x 2), borderline OM2 lesion;  c. 01/2014 MV: Intermediate risk, medium-sized distal ant wall infarct w/ very small amt of peri-infarct ischemia. EF 60%.  . Cellulitis 04/2014   left facial  . Chondromalacia of medial femoral condyle    Left knee MRI 04/28/12: Chondromalacia of the medial femoral condyle with slight peripheral degeneration of the meniscocapsular junction of the medial meniscus; followed by sports medicine  . Collagen vascular disease ([place])   . Crack cocaine use    for 20+ years, has been enrolled in detox programs in the past  . Depression    with history of hospitalization for suicidal ideation  . Diabetes mellitus 2002   Diagnosed in 2002, started insulin in 2012  . Gout   . Gout 04/28/2012  . Headache(784.0)    CT head 08/2011: Periventricular and subcortical white matter hypodensities are most in keeping with chronic  microangiopathic change  . HIV infection [facility]) Nov 2012   Followed by [name]  . Hyperlipidemia   . Hypertension   . Pulmonary embolism [facility])     Past Surgical History:  Procedure Laterality Date  . BACK SURGERY     1988  . BOWEL RESECTION    . CARDIAC SURGERY    . CERVICAL SPINE SURGERY     " rods in my neck "  . CORONARY ARTERY BYPASS GRAFT    . CORONARY STENT PLACEMENT    . NM MYOCAR PERF WALL MOTION  12/27/2011   normal  . SPINE SURGERY     Family History:  Family History  Problem Relation Age of Onset  . Diabetes Mother   . Hypertension Mother   . Hyperlipidemia Mother   . Diabetes Father   . Cancer Father   . Hypertension Father   . Diabetes Brother   . Heart disease Brother   . Diabetes Sister   . Colon cancer Neg Hx    Family Psychiatric  History: See H&P Social History:  Social History   Substance and Sexual Activity  Alcohol Use No  . Alcohol/week: 4.0 standard drinks  . Types: 2 Cans of beer, 2 Shots of liquor per week     Social History   Substance and Sexual Activity  Drug Use Yes  . Types: "Crack" cocaine, Cocaine    Social History   Socioeconomic History  . Marital status: Widowed    Spouse name: Not on file  . Number of children:  2  . Years of education: 35  . Highest education level: 12th grade  Occupational History    Employer: UNEMPLOYED    Comment: 04/2016  Social Needs  . Financial resource strain: Somewhat hard  . Food insecurity    Worry: Sometimes true    Inability: Sometimes true  . Transportation needs    Medical: Yes    Non-medical: Yes  Tobacco Use  . Smoking status: Never Smoker  . Smokeless tobacco: Never Used  Substance and Sexual Activity  . Alcohol use: No    Alcohol/week: 4.0 standard drinks    Types: 2 Cans of beer, 2 Shots of liquor per week  . Drug use: Yes    Types: "Crack" cocaine, Cocaine  . Sexual activity: Yes    Comment: accepted condoms  Lifestyle  . Physical activity    Days per week: 0  days    Minutes per session: 0 min  . Stress: Very much  Relationships  . Social Herbalist on phone: Once a week    Gets together: Never    Attends religious service: Never    Active member of club or organization: No    Attends meetings of clubs or organizations: Never    Relationship status: Widowed  Other Topics Concern  . Not on file  Social History Narrative   Currently staying with a friend in [place].  Was staying @ local motel until a few days ago - left b/c of bed bugs.    Hospital Course:  (Per MDs Admission SRA) Patient is a 67 year old male with a past psychiatric history significant for unspecified depression and cocaine dependence who presented to the [facility] emergency department on 06/13/2019 with suicidal ideation.  The patient stated that he had been using cocaine since he was age 41.  He stated that he had become recently very depressed because the fact that his family could not provide him with housing or other needed necessities of daily living.  He stated that he was currently homeless, but with the above-stated issues "had no hope left".  Patient had previous psychiatric admissions in December 2019 at the behavioral health hospital as well as [place] and [facility] [place] for similar presentations.  He had been attempting to be followed at an out reach clinic, but review of the electronic medical record revealed multiple attempts to contact with him without success.  He stated he has been taking his HIV medicines on and off.  These prescriptions have been refilled.  He denied any other drugs or alcohol.  He has significant past medical history for renal disease as well as coronary artery disease.  He underwent a bypass graft surgery on his heart in 2019.  He admitted to helplessness, hopelessness and worthlessness.  He was admitted to the hospital for evaluation and stabilization.  Physical Findings: AIMS: Facial and Oral Movements Muscles of  Facial Expression: None, normal Lips and Perioral Area: None, normal Jaw: None, normal Tongue: None, normal,Extremity Movements Upper (arms, wrists, hands, fingers): None, normal Lower (legs, knees, ankles, toes): None, normal, Trunk Movements Neck, shoulders, hips: None, normal, Overall Severity Severity of abnormal movements (highest score from questions above): None, normal Incapacitation due to abnormal movements: None, normal Patient's awareness of abnormal movements (rate only patient's report): No Awareness, Dental Status Current problems with teeth and/or dentures?: No Does patient usually wear dentures?: No  CIWA:  CIWA-Ar Total: 1 COWS:     Musculoskeletal: Strength & Muscle Tone: within  normal limits Gait & Station: normal Patient leans: N/A  Psychiatric Specialty Exam: Physical Exam  Constitutional: He is oriented to person, place, and time. He appears well-developed.  Cardiovascular: Normal rate.  Genitourinary:    Genitourinary Comments: deferred   Musculoskeletal: Normal range of motion.  Neurological: He is alert and oriented to person, place, and time.    Review of Systems  Constitutional: Negative for chills and fever.  Respiratory: Negative for cough and shortness of breath.   Cardiovascular: Negative for chest pain.  Gastrointestinal: Negative for heartburn.  Psychiatric/Behavioral: Positive for depression (stable with medications) and substance abuse (HX cocaine/THC disorders). Negative for hallucinations, memory loss and suicidal ideas. The patient is nervous/anxious (stable with medication) and has insomnia (stable with medications).     Blood pressure 126/88, pulse 92, temperature 98.2 F (36.8 C), temperature source Oral, resp. rate 16, height 5\' 8"  (1.727 m), weight 74.4 kg, SpO2 100 %.Body mass index is 24.94 kg/m.   See MDs Discharge SRA    Has this patient used any form of tobacco in the last 30 days? (Cigarettes, Smokeless Tobacco, Cigars,  and/or Pipes) N/A  Blood Alcohol level:  Lab Results  Component Value Date   ETH 17 (H) 06/11/2019   ETH <10 95/18/8416    Metabolic Disorder Labs:  Lab Results  Component Value Date   HGBA1C 10.9 (H) 06/16/2019   MPG 266 06/16/2019   MPG 332 04/15/2019   No results found for: PROLACTIN Lab Results  Component Value Date   CHOL 195 06/16/2019   TRIG 142 06/16/2019   HDL 43 06/16/2019   CHOLHDL 4.5 06/16/2019   VLDL 28 06/16/2019   LDLCALC 124 (H) 06/16/2019   LDLCALC 110 (H) 05/04/2018    See Psychiatric Specialty Exam and Suicide Risk Assessment completed by Attending Physician prior to discharge.  Discharge destination:  Other:  inpatient treatment facility  Is patient on multiple antipsychotic therapies at discharge:  No   Has Patient had three or more failed trials of antipsychotic monotherapy by history:  No  Recommended Plan for Multiple Antipsychotic Therapies: NA  Discharge Instructions    Diet - low sodium heart healthy   Complete by: As directed    Increase activity slowly   Complete by: As directed      Allergies as of 06/19/2019   No Known Allergies     Medication List    TAKE these medications     Indication  albuterol 108 (90 Base) MCG/ACT inhaler Commonly known as: VENTOLIN HFA Inhale 1-2 puffs into the lungs every 6 (six) hours as needed for wheezing or shortness of breath.  Indication: Chronic Obstructive Lung Disease   allopurinol 300 MG tablet Commonly known as: ZYLOPRIM Take 1 tablet (300 mg total) by mouth daily. For gout Start taking on: June 20, 2019  Indication: Primary Gout   aspirin 81 MG chewable tablet Chew 1 tablet (81 mg total) by mouth daily. Heart health Start taking on: June 20, 2019  Indication: heart health   diltiazem 120 MG 24 hr capsule Commonly known as: CARDIZEM CD Take 1 capsule (120 mg total) by mouth daily. For blood pressure Start taking on: June 20, 2019  Indication: High Blood Pressure  Disorder   emtricitabine-rilpivir-tenofovir AF 200-25-25 MG Tabs tablet Commonly known as: ODEFSEY Take 1 tablet by mouth daily with breakfast. For HIV infection Start taking on: June 20, 2019  Indication: HIV Disease   famotidine 20 MG tablet Commonly known as: PEPCID Take 1 tablet (20 mg  total) by mouth 2 (two) times daily. For heart burn  Indication: Gastroesophageal Reflux Disease, Heartburn   ferrous sulfate 325 (65 FE) MG tablet Take 1 tablet (325 mg total) by mouth 2 (two) times daily with a meal. For low iron  Indication: Iron Deficiency   fluticasone 44 MCG/ACT inhaler Commonly known as: FLOVENT HFA Inhale 1 puff into the lungs 2 (two) times daily. For shortness of breath  Indication: Chronic Obstructive Lung Disease   folic acid 1 MG tablet Commonly known as: FOLVITE Take 1 tablet (1 mg total) by mouth daily. For folate replacement Start taking on: June 20, 2019  Indication: Anemia From Inadequate Folic Acid   gabapentin 400 MG capsule Commonly known as: NEURONTIN Take 1 capsule (400 mg total) by mouth 2 (two) times daily. For agitation/neuropathy  Indication: Agitation, Neuropathic Pain   hydrOXYzine 25 MG tablet Commonly known as: ATARAX/VISTARIL Take 1 tablet (25 mg total) by mouth 3 (three) times daily as needed for anxiety.  Indication: Feeling Anxious   insulin glargine 100 UNIT/ML injection Commonly known as: LANTUS Inject 0.2 mLs (20 Units total) into the skin at bedtime. For high blood sugar  Indication: Type 2 Diabetes   metFORMIN 850 MG tablet Commonly known as: GLUCOPHAGE Take 1 tablet (850 mg total) by mouth 2 (two) times daily with a meal. For high blood sugars  Indication: Type 2 Diabetes   nitroGLYCERIN 0.4 MG SL tablet Commonly known as: NITROSTAT Place 1 tablet (0.4 mg total) under the tongue every 5 (five) minutes as needed for chest pain.  Indication: Acute Angina Pectoris   polyethylene glycol 17 g packet Commonly known  as: MIRALAX / GLYCOLAX Take 17 g by mouth daily as needed for mild constipation.  Indication: Constipation   sertraline 50 MG tablet Commonly known as: ZOLOFT Take 1 tablet (50 mg total) by mouth daily. For depression  Indication: Major Depressive Disorder   thiamine 100 MG tablet Take 1 tablet (100 mg total) by mouth daily. For thiamine deficiency Start taking on: June 20, 2019  Indication: Deficiency of Vitamin B1   traZODone 100 MG tablet Commonly known as: DESYREL Take 1 tablet (100 mg total) by mouth at bedtime as needed for sleep.  Indication: [place] Follow up.   Why: You have been accepted for treatment on Saturday, 9/12.  Correct number is [phone].  Make sure to bring your belongings.  Contact information: [place] [address] [phone]          Follow-up recommendations:  Activity as tolerated.  Comments:  Take all of you medications as prescribed by your mental healthcare provider.  Report any adverse effects and reactions from your medications to your outpatient provider promptly.  Do not engage in alcohol and or illegal drug use while on prescription medicines. Keep all scheduled appointments. This is to ensure that you are getting refills on time and to avoid any interruption in your medication.  If you are unable to keep an appointment call to reschedule.  Be sure to follow up with resources and follow ups given. In the event of worsening symptoms call the crisis hotline, 911, and or go to the nearest emergency department for appropriate evaluation and treatment of symptoms. Follow-up with your primary care provider for your medical issues, concerns and or health care needs.     Signed: Mallie Darting, NP 06/19/2019, 10:44 AM  Patient seen, Suicide  Assessment Completed.  Disposition Plan Reviewed

## 2019-06-19 NOTE — [facility] Suicide Risk Assessment (Addendum)
[facility] Discharge Suicide Risk Assessment   Principal Problem: Depression, Substance Use Disorder  Discharge Diagnoses: Active Problems:   MDD (major depressive disorder), recurrent episode, severe ([place])   Total Time spent with patient: 30 minutes  Musculoskeletal: Strength & Muscle Tone: within normal limits Gait & Station: normal- gait slow, steady, reports improved compared to admission Patient leans: N/A  Psychiatric Specialty Exam: ROS no chest pain, no shortness of breath, no vomiting, no fever, no chills  Blood pressure 126/88, pulse 92, temperature 98.2 F (36.8 C), temperature source Oral, resp. rate 16, height 5\' 8"  (1.727 m), weight 74.4 kg, SpO2 100 %.Body mass index is 24.94 kg/m.  General Appearance: Well Groomed  Eye Contact::  Good  Speech:  Normal Rate409  Volume:  Normal  Mood:  reports improved mood , and today presents with a fuller range of affect   Affect:  more reactive today  Thought Process:  Linear and Descriptions of Associations: Intact  Orientation:  Other:  fully alert and attentive  Thought Content:  denies hallucinations, no delusions   Suicidal Thoughts:  No denies suicidal or self injurious ideations, denies homicidal or violent ideations  Homicidal Thoughts:  No  Memory:  recent and remote grossly intact   Judgement:  Other:  improving   Insight:  fair/improving   Psychomotor Activity:  Normal  Concentration:  Good  Recall:  Good  Fund of Knowledge:Good  Language: Good  Akathisia:  Negative  Handed:  Right  AIMS (if indicated):     Assets:  Communication Skills Desire for Improvement Resilience  Sleep:  Number of Hours: 6.75  Cognition: WNL  ADL's:  Intact   Mental Status Per Nursing Assessment::   On Admission:  Suicidal ideation indicated by patient, Suicide plan, Self-harm thoughts, Belief that plan would result in death  Demographic Factors:  53 y old male   Loss Factors: Homelessness, substance abuse, limited support  network  Historical Factors: History of depression, substance abuse ( cocaine)   Risk Reduction Factors:   Positive coping skills or problem solving skills  Continued Clinical Symptoms:  Today patient presents alert, attentive, calm.  Mood has improved.  Affect is fuller in range.  Reports feeling relieved and with better mood now that he has been accepted to [facility] treatment center, as homelessness has been a significant stressor and was worried about disposition planning options.  Currently denies suicidal or self-injurious ideations, denies homicidal ideations.  Denies hallucinations.  No delusions are expressed.  He is oriented x3.  He is future oriented and states that upon completion of [place] he may opt to remain in the [place] area and is hopeful that [facility] will help him with housing/living options once he completes program. Denies medication side effects. Behavior on unit in good control, no disruptive or agitated behaviors.  Cognitive Features That Contribute To Risk:  No gross cognitive deficits noted upon discharge. Is alert , attentive, and oriented x 3   Suicide Risk:  Mild:  Suicidal ideation of limited frequency, intensity, duration, and specificity.  There are no identifiable plans, no associated intent, mild dysphoria and related symptoms, good self-control (both objective and subjective assessment), few other risk factors, and identifiable protective factors, including available and accessible social support.  [place] Follow up.   Why: You have been accepted for treatment on Saturday, 9/12.  Correct number is [phone].  Make sure to bring your belongings.  Contact information: [address]  [place] [address] [phone]           Plan Of Care/Follow-up recommendations:  Activity:  As tolerated Diet:  Heart healthy Tests:  NA Other:  See below Patient is expressing  improved mood and readiness to discharge to [facility] Rehab setting .  As above, states he is currently thinking of remaining in [place] following discharge.  I have confirmed, via CSW, that [facility] staff is aware patient has chronic medical illnesses which will require ongoing /chronic medical care and that their services include physician management .  I have stressed the importance of following up with a PCP following discharge, including for further work up of anemia as indicated .  Jenne Campus, MD 06/19/2019, 10:09 AM

## 2019-06-19 NOTE — Progress Notes (Signed)
Nursing discharge note: Patient discharged to [facility] per MD order.  Patient received all personal belongings from unit and locker.  Reviewed AVS/transition record with patient and he indicates understanding.  Patient will follow up with [facility].  Patient denies any thoughts of self harm.  He left ambulatory with his ride.

## 2019-07-01 ENCOUNTER — Ambulatory Visit: Payer: Medicare Other | Admitting: Pharmacist

## 2019-07-16 ENCOUNTER — Emergency Department (HOSPITAL_COMMUNITY): Payer: Medicare Other

## 2019-07-16 ENCOUNTER — Other Ambulatory Visit: Payer: Self-pay

## 2019-07-16 ENCOUNTER — Inpatient Hospital Stay (HOSPITAL_COMMUNITY)
Admission: EM | Admit: 2019-07-16 | Discharge: 2019-08-04 | DRG: 617 | Disposition: A | Discharge status: Home / Self Care | Payer: Medicare Other | Attending: Internal Medicine | Admitting: Internal Medicine

## 2019-07-16 ENCOUNTER — Encounter (HOSPITAL_COMMUNITY): Payer: Self-pay | Admitting: Emergency Medicine

## 2019-07-16 DIAGNOSIS — F1420 Cocaine dependence, uncomplicated: Secondary | ICD-10-CM | POA: Diagnosis not present

## 2019-07-16 DIAGNOSIS — R079 Chest pain, unspecified: Secondary | ICD-10-CM

## 2019-07-16 DIAGNOSIS — N183 Chronic kidney disease, stage 3 unspecified: Secondary | ICD-10-CM | POA: Diagnosis present

## 2019-07-16 DIAGNOSIS — Z79899 Other long term (current) drug therapy: Secondary | ICD-10-CM

## 2019-07-16 DIAGNOSIS — J45909 Unspecified asthma, uncomplicated: Secondary | ICD-10-CM | POA: Diagnosis present

## 2019-07-16 DIAGNOSIS — I2510 Atherosclerotic heart disease of native coronary artery without angina pectoris: Secondary | ICD-10-CM | POA: Diagnosis not present

## 2019-07-16 DIAGNOSIS — B20 Human immunodeficiency virus [HIV] disease: Secondary | ICD-10-CM | POA: Diagnosis present

## 2019-07-16 DIAGNOSIS — E875 Hyperkalemia: Secondary | ICD-10-CM | POA: Diagnosis not present

## 2019-07-16 DIAGNOSIS — Z9114 Patient's other noncompliance with medication regimen: Secondary | ICD-10-CM

## 2019-07-16 DIAGNOSIS — L02611 Cutaneous abscess of right foot: Secondary | ICD-10-CM

## 2019-07-16 DIAGNOSIS — E1152 Type 2 diabetes mellitus with diabetic peripheral angiopathy with gangrene: Secondary | ICD-10-CM | POA: Diagnosis present

## 2019-07-16 DIAGNOSIS — M79674 Pain in right toe(s): Secondary | ICD-10-CM | POA: Diagnosis not present

## 2019-07-16 DIAGNOSIS — E1169 Type 2 diabetes mellitus with other specified complication: Secondary | ICD-10-CM | POA: Diagnosis not present

## 2019-07-16 DIAGNOSIS — Z833 Family history of diabetes mellitus: Secondary | ICD-10-CM

## 2019-07-16 DIAGNOSIS — L03031 Cellulitis of right toe: Secondary | ICD-10-CM | POA: Diagnosis present

## 2019-07-16 DIAGNOSIS — Z955 Presence of coronary angioplasty implant and graft: Secondary | ICD-10-CM

## 2019-07-16 DIAGNOSIS — E1165 Type 2 diabetes mellitus with hyperglycemia: Secondary | ICD-10-CM | POA: Diagnosis present

## 2019-07-16 DIAGNOSIS — N179 Acute kidney failure, unspecified: Secondary | ICD-10-CM | POA: Diagnosis present

## 2019-07-16 DIAGNOSIS — Z8249 Family history of ischemic heart disease and other diseases of the circulatory system: Secondary | ICD-10-CM

## 2019-07-16 DIAGNOSIS — Z794 Long term (current) use of insulin: Secondary | ICD-10-CM

## 2019-07-16 DIAGNOSIS — I129 Hypertensive chronic kidney disease with stage 1 through stage 4 chronic kidney disease, or unspecified chronic kidney disease: Secondary | ICD-10-CM | POA: Diagnosis present

## 2019-07-16 DIAGNOSIS — I480 Paroxysmal atrial fibrillation: Secondary | ICD-10-CM | POA: Diagnosis present

## 2019-07-16 DIAGNOSIS — K219 Gastro-esophageal reflux disease without esophagitis: Secondary | ICD-10-CM | POA: Diagnosis present

## 2019-07-16 DIAGNOSIS — E785 Hyperlipidemia, unspecified: Secondary | ICD-10-CM | POA: Diagnosis present

## 2019-07-16 DIAGNOSIS — M94262 Chondromalacia, left knee: Secondary | ICD-10-CM | POA: Diagnosis present

## 2019-07-16 DIAGNOSIS — Z8349 Family history of other endocrine, nutritional and metabolic diseases: Secondary | ICD-10-CM

## 2019-07-16 DIAGNOSIS — Z7951 Long term (current) use of inhaled steroids: Secondary | ICD-10-CM

## 2019-07-16 DIAGNOSIS — F333 Major depressive disorder, recurrent, severe with psychotic symptoms: Secondary | ICD-10-CM | POA: Diagnosis present

## 2019-07-16 DIAGNOSIS — E1151 Type 2 diabetes mellitus with diabetic peripheral angiopathy without gangrene: Secondary | ICD-10-CM | POA: Diagnosis present

## 2019-07-16 DIAGNOSIS — Z86711 Personal history of pulmonary embolism: Secondary | ICD-10-CM

## 2019-07-16 DIAGNOSIS — E1122 Type 2 diabetes mellitus with diabetic chronic kidney disease: Secondary | ICD-10-CM | POA: Diagnosis present

## 2019-07-16 DIAGNOSIS — E1129 Type 2 diabetes mellitus with other diabetic kidney complication: Secondary | ICD-10-CM | POA: Diagnosis present

## 2019-07-16 DIAGNOSIS — Z20828 Contact with and (suspected) exposure to other viral communicable diseases: Secondary | ICD-10-CM | POA: Diagnosis present

## 2019-07-16 DIAGNOSIS — D509 Iron deficiency anemia, unspecified: Secondary | ICD-10-CM | POA: Diagnosis present

## 2019-07-16 DIAGNOSIS — N1831 Chronic kidney disease, stage 3a: Secondary | ICD-10-CM | POA: Diagnosis not present

## 2019-07-16 DIAGNOSIS — F1410 Cocaine abuse, uncomplicated: Secondary | ICD-10-CM

## 2019-07-16 DIAGNOSIS — I96 Gangrene, not elsewhere classified: Secondary | ICD-10-CM | POA: Diagnosis present

## 2019-07-16 DIAGNOSIS — D631 Anemia in chronic kidney disease: Secondary | ICD-10-CM | POA: Diagnosis present

## 2019-07-16 DIAGNOSIS — M869 Osteomyelitis, unspecified: Secondary | ICD-10-CM | POA: Diagnosis present

## 2019-07-16 DIAGNOSIS — M109 Gout, unspecified: Secondary | ICD-10-CM | POA: Diagnosis present

## 2019-07-16 DIAGNOSIS — Z7982 Long term (current) use of aspirin: Secondary | ICD-10-CM

## 2019-07-16 DIAGNOSIS — Z951 Presence of aortocoronary bypass graft: Secondary | ICD-10-CM

## 2019-07-16 DIAGNOSIS — I10 Essential (primary) hypertension: Secondary | ICD-10-CM | POA: Diagnosis present

## 2019-07-16 LAB — GLUCOSE, CAPILLARY: Glucose-Capillary: 212 mg/dL — ABNORMAL HIGH (ref 70–99)

## 2019-07-16 LAB — RAPID URINE DRUG SCREEN, HOSP PERFORMED
Amphetamines: NOT DETECTED
Barbiturates: NOT DETECTED
Benzodiazepines: NOT DETECTED
Cocaine: POSITIVE — AB
Opiates: NOT DETECTED
Tetrahydrocannabinol: NOT DETECTED

## 2019-07-16 LAB — BASIC METABOLIC PANEL
Anion gap: 12 (ref 5–15)
BUN: 34 mg/dL — ABNORMAL HIGH (ref 8–23)
CO2: 23 mmol/L (ref 22–32)
Calcium: 9.4 mg/dL (ref 8.9–10.3)
Chloride: 104 mmol/L (ref 98–111)
Creatinine, Ser: 1.7 mg/dL — ABNORMAL HIGH (ref 0.61–1.24)
GFR calc Af Amer: 47 mL/min — ABNORMAL LOW (ref >60)
GFR calc non Af Amer: 40 mL/min — ABNORMAL LOW (ref >60)
Glucose, Bld: 339 mg/dL — ABNORMAL HIGH (ref 70–99)
Potassium: 3.9 mmol/L (ref 3.5–5.1)
Sodium: 139 mmol/L (ref 135–145)

## 2019-07-16 LAB — CBC
HCT: 31.4 % — ABNORMAL LOW (ref 39.0–52.0)
Hemoglobin: 10.4 g/dL — ABNORMAL LOW (ref 13.0–17.0)
MCH: 26.4 pg (ref 26.0–34.0)
MCHC: 33.1 g/dL (ref 30.0–36.0)
MCV: 79.7 fL — ABNORMAL LOW (ref 80.0–100.0)
Platelets: 167 10*3/uL (ref 150–400)
RBC: 3.94 MIL/uL — ABNORMAL LOW (ref 4.22–5.81)
RDW: 14 % (ref 11.5–15.5)
WBC: 6.4 10*3/uL (ref 4.0–10.5)
nRBC: 0 % (ref 0.0–0.2)

## 2019-07-16 LAB — TROPONIN I (HIGH SENSITIVITY)
Troponin I (High Sensitivity): 10 ng/L (ref <18)
Troponin I (High Sensitivity): 10 ng/L (ref <18)

## 2019-07-16 LAB — D-DIMER, QUANTITATIVE: D-Dimer, Quant: 1.26 ug/mL-FEU — ABNORMAL HIGH (ref 0.00–0.50)

## 2019-07-16 LAB — SARS CORONAVIRUS 2 (TAT 6-24 HRS): SARS Coronavirus 2: NEGATIVE

## 2019-07-16 MED ORDER — INSULIN GLARGINE 100 UNIT/ML ~~LOC~~ SOLN
14.0000 [IU] | Freq: Every day | SUBCUTANEOUS | Status: DC
  Administered 2019-07-17 – 2019-07-21 (×6): 14 [IU] via SUBCUTANEOUS
  Filled 2019-07-16 (×7): qty 0.14

## 2019-07-16 MED ORDER — TRAZODONE HCL 100 MG PO TABS
100.0000 mg | ORAL_TABLET | Freq: Every evening | ORAL | Status: DC | PRN
  Administered 2019-07-18 – 2019-07-30 (×8): 100 mg via ORAL
  Filled 2019-07-16 (×8): qty 1

## 2019-07-16 MED ORDER — COLCHICINE 0.6 MG PO TABS: 6.0000 mg | ORAL_TABLET | Freq: Every day | ORAL | Status: DC | PRN

## 2019-07-16 MED ORDER — FAMOTIDINE 20 MG PO TABS
20.0000 mg | ORAL_TABLET | Freq: Two times a day (BID) | ORAL | Status: DC
  Administered 2019-07-17 – 2019-07-19 (×6): 20 mg via ORAL
  Filled 2019-07-16 (×6): qty 1

## 2019-07-16 MED ORDER — OXYCODONE-ACETAMINOPHEN 5-325 MG PO TABS
1.0000 | ORAL_TABLET | ORAL | Status: DC | PRN
  Administered 2019-07-16: 1 via ORAL
  Filled 2019-07-16: qty 1

## 2019-07-16 MED ORDER — RISPERIDONE 0.25 MG PO TABS
0.2500 mg | ORAL_TABLET | Freq: Two times a day (BID) | ORAL | Status: DC
  Administered 2019-07-17 – 2019-08-04 (×38): 0.25 mg via ORAL
  Filled 2019-07-16 (×39): qty 1

## 2019-07-16 MED ORDER — EMTRICITAB-RILPIVIR-TENOFOV AF 200-25-25 MG PO TABS
1.0000 | ORAL_TABLET | Freq: Every day | ORAL | Status: DC
  Administered 2019-07-17 – 2019-08-04 (×19): 1 via ORAL
  Filled 2019-07-16 (×19): qty 1

## 2019-07-16 MED ORDER — ACETAMINOPHEN 325 MG PO TABS: 650.0000 mg | ORAL_TABLET | ORAL | Status: DC | PRN

## 2019-07-16 MED ORDER — SERTRALINE HCL 50 MG PO TABS
50.0000 mg | ORAL_TABLET | Freq: Every day | ORAL | Status: DC
  Administered 2019-07-17 – 2019-08-04 (×19): 50 mg via ORAL
  Filled 2019-07-16 (×19): qty 1

## 2019-07-16 MED ORDER — ASPIRIN 81 MG PO CHEW
81.0000 mg | CHEWABLE_TABLET | Freq: Every day | ORAL | Status: DC
  Administered 2019-07-17 – 2019-08-04 (×19): 81 mg via ORAL
  Filled 2019-07-16 (×19): qty 1

## 2019-07-16 MED ORDER — HYDRALAZINE HCL 25 MG PO TABS
25.0000 mg | ORAL_TABLET | Freq: Three times a day (TID) | ORAL | Status: DC | PRN
  Administered 2019-07-19: 25 mg via ORAL
  Filled 2019-07-16: qty 1

## 2019-07-16 MED ORDER — FERROUS SULFATE 325 (65 FE) MG PO TABS
325.0000 mg | ORAL_TABLET | Freq: Two times a day (BID) | ORAL | Status: DC
  Administered 2019-07-17 – 2019-08-04 (×35): 325 mg via ORAL
  Filled 2019-07-16 (×36): qty 1

## 2019-07-16 MED ORDER — DILTIAZEM HCL ER COATED BEADS 120 MG PO CP24
120.0000 mg | ORAL_CAPSULE | Freq: Every day | ORAL | Status: DC
  Administered 2019-07-17 – 2019-08-04 (×19): 120 mg via ORAL
  Filled 2019-07-16 (×19): qty 1

## 2019-07-16 MED ORDER — GABAPENTIN 400 MG PO CAPS
400.0000 mg | ORAL_CAPSULE | Freq: Two times a day (BID) | ORAL | Status: DC
  Administered 2019-07-17 – 2019-08-04 (×38): 400 mg via ORAL
  Filled 2019-07-16 (×38): qty 1

## 2019-07-16 MED ORDER — ALLOPURINOL 300 MG PO TABS
300.0000 mg | ORAL_TABLET | Freq: Every day | ORAL | Status: DC
  Administered 2019-07-17 – 2019-08-04 (×19): 300 mg via ORAL
  Filled 2019-07-16 (×19): qty 1

## 2019-07-16 MED ORDER — COLCHICINE 0.6 MG PO TABS
0.6000 mg | ORAL_TABLET | Freq: Every day | ORAL | Status: DC | PRN
  Administered 2019-07-17: 0.6 mg via ORAL
  Filled 2019-07-16: qty 1

## 2019-07-16 MED ORDER — OXYCODONE-ACETAMINOPHEN 5-325 MG PO TABS
1.0000 | ORAL_TABLET | ORAL | Status: DC | PRN
  Administered 2019-07-17 – 2019-07-20 (×5): 1 via ORAL
  Filled 2019-07-16 (×5): qty 1

## 2019-07-16 MED ORDER — FOLIC ACID 1 MG PO TABS
1.0000 mg | ORAL_TABLET | Freq: Every day | ORAL | Status: DC
  Administered 2019-07-17 – 2019-08-04 (×19): 1 mg via ORAL
  Filled 2019-07-16 (×19): qty 1

## 2019-07-16 MED ORDER — ALBUTEROL SULFATE (2.5 MG/3ML) 0.083% IN NEBU: 2.5000 mg | INHALATION_SOLUTION | Freq: Four times a day (QID) | RESPIRATORY_TRACT | Status: DC | PRN

## 2019-07-16 MED ORDER — VITAMIN B-1 100 MG PO TABS
100.0000 mg | ORAL_TABLET | Freq: Every day | ORAL | Status: DC
  Administered 2019-07-17 – 2019-08-04 (×19): 100 mg via ORAL
  Filled 2019-07-16 (×19): qty 1

## 2019-07-16 MED ORDER — INSULIN ASPART 100 UNIT/ML ~~LOC~~ SOLN
0.0000 [IU] | Freq: Three times a day (TID) | SUBCUTANEOUS | Status: DC
  Administered 2019-07-17 (×3): 1 [IU] via SUBCUTANEOUS
  Administered 2019-07-18: 2 [IU] via SUBCUTANEOUS
  Administered 2019-07-18: 3 [IU] via SUBCUTANEOUS
  Administered 2019-07-19: 1 [IU] via SUBCUTANEOUS
  Administered 2019-07-20: 3 [IU] via SUBCUTANEOUS
  Administered 2019-07-20: 2 [IU] via SUBCUTANEOUS
  Administered 2019-07-20: 3 [IU] via SUBCUTANEOUS
  Administered 2019-07-21: 9 [IU] via SUBCUTANEOUS
  Administered 2019-07-21: 2 [IU] via SUBCUTANEOUS
  Administered 2019-07-21: 3 [IU] via SUBCUTANEOUS
  Administered 2019-07-22: 5 [IU] via SUBCUTANEOUS
  Administered 2019-07-22: 9 [IU] via SUBCUTANEOUS
  Administered 2019-07-22: 7 [IU] via SUBCUTANEOUS
  Administered 2019-07-23: 2 [IU] via SUBCUTANEOUS
  Administered 2019-07-23: 1 [IU] via SUBCUTANEOUS
  Administered 2019-07-24: 3 [IU] via SUBCUTANEOUS
  Administered 2019-07-25: 2 [IU] via SUBCUTANEOUS
  Administered 2019-07-25: 7 [IU] via SUBCUTANEOUS
  Administered 2019-07-25: 3 [IU] via SUBCUTANEOUS
  Administered 2019-07-26: 5 [IU] via SUBCUTANEOUS
  Administered 2019-07-26: 7 [IU] via SUBCUTANEOUS
  Administered 2019-07-26: 5 [IU] via SUBCUTANEOUS
  Administered 2019-07-27: 3 [IU] via SUBCUTANEOUS
  Administered 2019-07-27: 2 [IU] via SUBCUTANEOUS
  Administered 2019-07-27: 3 [IU] via SUBCUTANEOUS
  Administered 2019-07-28: 5 [IU] via SUBCUTANEOUS
  Administered 2019-07-28: 1 [IU] via SUBCUTANEOUS
  Administered 2019-07-28: 3 [IU] via SUBCUTANEOUS

## 2019-07-16 MED ORDER — SODIUM CHLORIDE 0.9% FLUSH: 3.0000 mL | Freq: Once | INTRAVENOUS | Status: DC

## 2019-07-16 MED ORDER — HEPARIN (PORCINE) 25000 UT/250ML-% IV SOLN
1100.0000 [IU]/h | INTRAVENOUS | Status: DC
  Administered 2019-07-16: 1200 [IU]/h via INTRAVENOUS
  Administered 2019-07-17: 1150 [IU]/h via INTRAVENOUS
  Filled 2019-07-16 (×2): qty 250

## 2019-07-16 MED ORDER — HEPARIN BOLUS VIA INFUSION
4500.0000 [IU] | Freq: Once | INTRAVENOUS | Status: AC
  Administered 2019-07-16: 4500 [IU] via INTRAVENOUS
  Filled 2019-07-16: qty 4500

## 2019-07-16 MED ORDER — ONDANSETRON HCL 4 MG/2ML IJ SOLN
4.0000 mg | Freq: Four times a day (QID) | INTRAMUSCULAR | Status: DC | PRN
  Administered 2019-07-19: 4 mg via INTRAVENOUS
  Filled 2019-07-16: qty 2

## 2019-07-16 MED ORDER — POLYETHYLENE GLYCOL 3350 17 G PO PACK
17.0000 g | PACK | Freq: Every day | ORAL | Status: DC | PRN
  Administered 2019-07-19 – 2019-07-24 (×4): 17 g via ORAL
  Filled 2019-07-16 (×4): qty 1

## 2019-07-16 MED ORDER — SODIUM CHLORIDE 0.9 % IV SOLN
INTRAVENOUS | Status: DC
  Administered 2019-07-17: 02:00:00 via INTRAVENOUS

## 2019-07-16 MED ORDER — ENOXAPARIN SODIUM 40 MG/0.4ML ~~LOC~~ SOLN: 40.0000 mg | SUBCUTANEOUS | Status: DC

## 2019-07-16 MED ORDER — HYDROXYZINE HCL 25 MG PO TABS: 25.0000 mg | ORAL_TABLET | Freq: Three times a day (TID) | ORAL | Status: DC | PRN

## 2019-07-16 NOTE — H&P (Signed)
History and Physical    Demetri Goshert LAG:[id number] DOB: Oct 05, 1951 DOA: 07/16/2019  Referring MD/NP/PA:   PCP: [name], PA-C   Patient coming from:  The patient is coming from home.  At baseline, pt is independent for most of ADL.        Chief Complaint: Chest pain  HPI: Michael [name] is a 68 y.o. male with medical history significant of hypertension, hyperlipidemia, diabetes mellitus, asthma, GERD, gout, depression, cocaine abuse, PE not on anticoagulants, HIV, CAD, s/po of CABG, s/p of stent placement, atrial fibrillation not on anticoagulants, CKD stage III, iron deficiency anemia, who presents with chest pain.  Patient states that he has been having mild intermittent chest pain for more than 1 year since he had CABG surgery.  His chest pain has worsened in the past 2 days.  It is located in the substernal area, also involving both side of front chest, 9 out of 10 severity, sharp, pleuritic, aggravated by cough and deep breaths.  He has mild dry cough, but no shortness of breath.  Denies tenderness in the calf areas.  Patient had a long distant traveling to [place] by driving a month ago.  No nausea, vomiting, diarrhea, abdominal pain, symptoms of UTI.  Patient states that he used cocaine yesterday.  He also complains of right second toe swelling and severe pain.  No fever or chills.  No symptoms of UTI or unilateral weakness.  ED Course: pt was found to have troponin 102, WBC 6.4, pending COVID-19 test, renal function close to baseline, temperature normal, blood pressure 145/86, heart rate 72, oxygen saturation 99% on room air.  Chest x-ray is negative.  X-ray of right foot showed possible distal second toe osteomyelitis. Pt is placed on telemetry bed for observation.  Review of Systems:   General: no fevers, chills, no body weight gain, has fatigue HEENT: no blurry vision, hearing changes or sore throat Respiratory: no dyspnea, has coughing, no wheezing CV:  has chest pain, no palpitations GI: no nausea, vomiting, abdominal pain, diarrhea, constipation GU: no dysuria, burning on urination, increased urinary frequency, hematuria  Ext: no leg edema Neuro: no unilateral weakness, numbness, or tingling, no vision change or hearing loss Skin: right second toe is swelling, tender and discolored MSK: No muscle spasm, no deformity, no limitation of range of movement in spin Heme: No easy bruising.  Travel history: No recent long distant travel.  Allergy: No Known Allergies  Past Medical History:  Diagnosis Date  . A-fib ([place])   . Asthma    No PFTs, history of childhood asthma  . CAD (coronary artery disease)    a. 06/2013 STEMI/PCI ([facility]): LAD w/ thrombus (treated with BMS), mid 75%, D2 75%; LCX OM2 75%; RCA small, PDA 95%, PLV 95%;  b. 10/2013 Cath/PCI: ISR w/in LAD (Promus DES x 2), borderline OM2 lesion;  c. 01/2014 MV: Intermediate risk, medium-sized distal ant wall infarct w/ very small amt of peri-infarct ischemia. EF 60%.  . Cellulitis 04/2014   left facial  . Chondromalacia of medial femoral condyle    Left knee MRI 04/28/12: Chondromalacia of the medial femoral condyle with slight peripheral degeneration of the meniscocapsular junction of the medial meniscus; followed by sports medicine  . Collagen vascular disease ([place])   . Crack cocaine use    for 20+ years, has been enrolled in detox programs in the past  . Depression    with history of hospitalization for suicidal ideation  . Diabetes mellitus 2002  Diagnosed in 2002, started insulin in 2012  . Gout   . Gout 04/28/2012  . Headache(784.0)    CT head 08/2011: Periventricular and subcortical white matter hypodensities are most in keeping with chronic microangiopathic change  . HIV infection [facility]) Nov 2012   Followed by [name]  . Hyperlipidemia   . Hypertension   . Pulmonary embolism [facility])     Past Surgical History:  Procedure Laterality Date  . BACK SURGERY     1988  . BOWEL  RESECTION    . CARDIAC SURGERY    . CERVICAL SPINE SURGERY     " rods in my neck "  . CORONARY ARTERY BYPASS GRAFT    . CORONARY STENT PLACEMENT    . NM MYOCAR PERF WALL MOTION  12/27/2011   normal  . SPINE SURGERY      Social History:  reports that he has never smoked. He has never used smokeless tobacco. He reports current drug use. Drugs: "Crack" cocaine and Cocaine. He reports that he does not drink alcohol.  Family History:  Family History  Problem Relation Age of Onset  . Diabetes Mother   . Hypertension Mother   . Hyperlipidemia Mother   . Diabetes Father   . Cancer Father   . Hypertension Father   . Diabetes Brother   . Heart disease Brother   . Diabetes Sister   . Colon cancer Neg Hx      Prior to Admission medications   Medication Sig Start Date End Date Taking? Authorizing Provider  albuterol (VENTOLIN HFA) 108 (90 Base) MCG/ACT inhaler Inhale 1-2 puffs into the lungs every 6 (six) hours as needed for wheezing or shortness of breath. 06/19/19   Mallie Darting, NP  allopurinol (ZYLOPRIM) 300 MG tablet Take 1 tablet (300 mg total) by mouth daily. For gout 06/20/19   Mallie Darting, NP  aspirin 81 MG chewable tablet Chew 1 tablet (81 mg total) by mouth daily. Heart health 06/20/19   Mallie Darting, NP  diltiazem (CARDIZEM CD) 120 MG 24 hr capsule Take 1 capsule (120 mg total) by mouth daily. For blood pressure 06/20/19   Mallie Darting, NP  emtricitabine-rilpivir-tenofovir AF (ODEFSEY) 200-25-25 MG TABS tablet Take 1 tablet by mouth daily with breakfast. For HIV infection 06/20/19   Mallie Darting, NP  famotidine (PEPCID) 20 MG tablet Take 1 tablet (20 mg total) by mouth 2 (two) times daily. For heart burn 06/19/19   Mallie Darting, NP  ferrous sulfate 325 (65 FE) MG tablet Take 1 tablet (325 mg total) by mouth 2 (two) times daily with a meal. For low iron 06/19/19   Mallie Darting, NP  fluticasone (FLOVENT HFA) 44 MCG/ACT inhaler Inhale 1 puff into the lungs 2 (two) times  daily. For shortness of breath 06/19/19   Mallie Darting, NP  folic acid (FOLVITE) 1 MG tablet Take 1 tablet (1 mg total) by mouth daily. For folate replacement 06/20/19   Merlyn Lot E, NP  gabapentin (NEURONTIN) 400 MG capsule Take 1 capsule (400 mg total) by mouth 2 (two) times daily. For agitation/neuropathy 06/19/19   Mallie Darting, NP  hydrOXYzine (ATARAX/VISTARIL) 25 MG tablet Take 1 tablet (25 mg total) by mouth 3 (three) times daily as needed for anxiety. 06/19/19   Mallie Darting, NP  insulin glargine (LANTUS) 100 UNIT/ML injection Inject 0.2 mLs (20 Units total) into the skin at bedtime. For high blood sugar 06/19/19   Merlyn Lot  E, NP  metFORMIN (GLUCOPHAGE) 850 MG tablet Take 1 tablet (850 mg total) by mouth 2 (two) times daily with a meal. For high blood sugars 06/19/19   Mallie Darting, NP  nitroGLYCERIN (NITROSTAT) 0.4 MG SL tablet Place 1 tablet (0.4 mg total) under the tongue every 5 (five) minutes as needed for chest pain. 06/19/19   Mallie Darting, NP  polyethylene glycol (MIRALAX / GLYCOLAX) 17 g packet Take 17 g by mouth daily as needed for mild constipation. 06/19/19   Mallie Darting, NP  sertraline (ZOLOFT) 50 MG tablet Take 1 tablet (50 mg total) by mouth daily. For depression 06/19/19   Mallie Darting, NP  thiamine 100 MG tablet Take 1 tablet (100 mg total) by mouth daily. For thiamine deficiency 06/20/19   Mallie Darting, NP  traZODone (DESYREL) 100 MG tablet Take 1 tablet (100 mg total) by mouth at bedtime as needed for sleep. 06/19/19   Mallie Darting, NP    Physical Exam: Vitals:   07/16/19 1730 07/16/19 1800 07/16/19 1933 07/16/19 2105  BP: (!) 143/75 136/84 (!) 156/95 (!) 154/95  Pulse: 70 72 72 63  Resp: 12 13 17 18   Temp:    98.1 F (36.7 C)  TempSrc:    Oral  SpO2: 100% 100% 100% 100%  Weight:    73 kg  Height:    5\' 8"  (1.727 m)   General: Not in acute distress HEENT:       Eyes: PERRL, EOMI, no scleral icterus.       ENT: No discharge from the  ears and nose, no pharynx injection, no tonsillar enlargement.        Neck: No JVD, no bruit, no mass felt. Heme: No neck lymph node enlargement. Cardiac: S1/S2, RRR, No murmurs, No gallops or rubs. Respiratory:  No rales, wheezing, rhonchi or rubs. GI: Soft, nondistended, nontender, no rebound pain, no organomegaly, BS present. GU: No hematuria Ext: No pitting leg edema bilaterally. 2+DP/PT pulse bilaterally. Musculoskeletal: No joint deformities, No joint redness or warmth, no limitation of ROM in spin. Skin:  right second toe is swelling, tender and discolored Neuro: Alert, oriented X3, cranial nerves II-XII grossly intact, moves all extremities normally.  Psych: Patient is not psychotic, no suicidal or hemocidal ideation.  Labs on Admission: I have personally reviewed following labs and imaging studies  CBC: Recent Labs  Lab 07/16/19 1130  WBC 6.4  HGB 10.4*  HCT 31.4*  MCV 79.7*  PLT 174   Basic Metabolic Panel: Recent Labs  Lab 07/16/19 1452  NA 139  K 3.9  CL 104  CO2 23  GLUCOSE 339*  BUN 34*  CREATININE 1.70*  CALCIUM 9.4   GFR: Estimated Creatinine Clearance: 39.7 mL/min (A) (by C-G formula based on SCr of 1.7 mg/dL (H)). Liver Function Tests: No results for input(s): AST, ALT, ALKPHOS, BILITOT, PROT, ALBUMIN in the last 168 hours. No results for input(s): LIPASE, AMYLASE in the last 168 hours. No results for input(s): AMMONIA in the last 168 hours. Coagulation Profile: No results for input(s): INR, PROTIME in the last 168 hours. Cardiac Enzymes: No results for input(s): CKTOTAL, CKMB, CKMBINDEX, TROPONINI in the last 168 hours. BNP (last 3 results) No results for input(s): PROBNP in the last 8760 hours. HbA1C: No results for input(s): HGBA1C in the last 72 hours. CBG: Recent Labs  Lab 07/16/19 2108  GLUCAP 212*   Lipid Profile: No results for input(s): CHOL, HDL, LDLCALC, TRIG, CHOLHDL, LDLDIRECT in  the last 72 hours. Thyroid Function Tests: No  results for input(s): TSH, T4TOTAL, FREET4, T3FREE, THYROIDAB in the last 72 hours. Anemia Panel: No results for input(s): VITAMINB12, FOLATE, FERRITIN, TIBC, IRON, RETICCTPCT in the last 72 hours. Urine analysis:    Component Value Date/Time   COLORURINE YELLOW 04/23/2019 [place] 04/23/2019 1758   LABSPEC 1.014 04/23/2019 1758   PHURINE 5.0 04/23/2019 1758   GLUCOSEU >=500 (A) 04/23/2019 1758   HGBUR NEGATIVE 04/23/2019 1758   BILIRUBINUR NEGATIVE 04/23/2019 1758   KETONESUR NEGATIVE 04/23/2019 1758   PROTEINUR 30 (A) 04/23/2019 1758   UROBILINOGEN 0.2 08/13/2015 1030   NITRITE NEGATIVE 04/23/2019 1758   LEUKOCYTESUR NEGATIVE 04/23/2019 1758   Sepsis Labs: @LABRCNTIP (procalcitonin:4,lacticidven:4) ) Recent Results (from the past 240 hour(s))  SARS CORONAVIRUS 2 (TAT 6-24 HRS) Nasopharyngeal Nasopharyngeal Swab     Status: None   Collection Time: 07/16/19  4:35 PM   Specimen: Nasopharyngeal Swab  Result Value Ref Range Status   SARS Coronavirus 2 NEGATIVE NEGATIVE Final    Comment: (NOTE) SARS-CoV-2 target nucleic acids are NOT DETECTED. The SARS-CoV-2 RNA is generally detectable in upper and lower respiratory specimens during the acute phase of infection. Negative results do not preclude SARS-CoV-2 infection, do not rule out co-infections with other pathogens, and should not be used as the sole basis for treatment or other patient management decisions. Negative results must be combined with clinical observations, patient history, and epidemiological information. The expected result is Negative. Fact Sheet for Patients: SugarRoll.be Fact Sheet for Healthcare Providers: https://www.woods-mathews.com/ This test is not yet approved or cleared by the [place] FDA and  has been authorized for detection and/or diagnosis of SARS-CoV-2 by FDA under an Emergency Use Authorization (EUA). This EUA will remain  in effect  (meaning this test can be used) for the duration of the COVID-19 declaration under Section 56 4(b)(1) of the Act, 21 U.S.C. section 360bbb-3(b)(1), unless the authorization is terminated or revoked sooner. Performed at [facility], [place] [address], [place], [place] [address]      Radiological Exams on Admission: Dg Chest 2 View  Result Date: 07/16/2019 CLINICAL DATA:  Chest pain EXAM: CHEST - 2 VIEW COMPARISON:  Chest radiograph dated 04/11/2019 FINDINGS: The heart size and mediastinal contours are within normal limits. Median sternotomy wires, vascular stents, and a left atrial appendage clip are redemonstrated. There is elevation of the right hemidiaphragm. Both lungs are clear. Degenerative changes are seen in the spine. IMPRESSION: No acute cardiopulmonary findings. Electronically Signed   By: Zerita Boers M.D.   On: 07/16/2019 12:13   Dg Foot Complete Right  Result Date: 07/16/2019 CLINICAL DATA:  Infection of the second toe. Swelling. EXAM: RIGHT FOOT COMPLETE - 3+ VIEW COMPARISON:  None. FINDINGS: There is swelling of the soft tissues of the distal second toe. On the lateral view the plantar aspect of the distal phalanx the second toe appears to be eroded away. This is not appreciable on the AP and oblique views. Old healed fractures of the fourth and fifth metatarsal shafts. Slight arthritic changes of the first MTP joint. IMPRESSION: 1. Soft tissue swelling of the distal second toe. 2. The appearance of the plantar aspect of the distal phalanx of the second toe is suspicious for osteomyelitis. 3. Arthritic changes of the first MTP joint. Electronically Signed   By: Lorriane Shire M.D.   On: 07/16/2019 16:42     EKG: Independently reviewed.  Not done in ED, will get  one.   Assessment/Plan Principal Problem:   Chest pain Active Problems:   HIV (human immunodeficiency virus infection) ([place])   Hyperlipidemia with target LDL less than 100   3-vessel CAD   Severe recurrent  major depressive disorder with psychotic features ([place])   Cocaine use disorder, severe, dependence ([place])   Gout   Essential hypertension   Type II diabetes mellitus with renal manifestations ([name])   GERD (gastroesophageal reflux disease)   S/P CABG (coronary artery bypass graft)   Chronic kidney disease, stage III (moderate)   Paroxysmal A-fib ([name])   Toe pain, right-second   Chest pain and hx of 3-vessel CAD: s/p of CABG. Etiology is not clear.  Patient has pleuritic chest pain.  Given his history of PE not on anticoagulants and elevated d-dimer 1.26.  Will start IV heparin empirically for possible PE.  Troponin negative x2 so far.  Patient used cocaine yesterday which may have contributed to poor sleep.  - will place on Tele bed for obs - Trend Trop - Repeat EKG in the am  - prn Nitroglycerin, percocet, and aspirin - Risk factor stratification: will check FLP and A1C  - check UDS  -V.Q scan in AM - LE doppler to r/o PE. -Cardiology consult is requested via epic  HIV (human immunodeficiency virus infection) ([place]): CD4= 270 on 12/11/9 -continue home HIV meds  Hyperlipidemia with target LDL less than 100: not taking meds now. -f/u FLP  Severe recurrent major depressive disorder with psychotic features ([place]): no SI or HI. -continue home meds  Cocaine use disorder, severe, dependence ([place]): -check UDS -did counseling about importance of quitting substance use  Gout: -allopurinol and Colchicine  Essential hypertension: -prn oral hydralazine Continue Cardizem   Type II diabetes mellitus with renal manifestations ([place]): Last A1c 10.9, poorly controled. Patient is taking metformin, Humalog and Lantus at home -will decrease Lantus dose from 20 into 14 unit daily -SSI  GERD (gastroesophageal reflux disease): -pepcid  Chronic kidney disease, stage III (moderate): Close to baseline.  Baseline creatinine 1.4-1.8.  His creatinine is 1.7, BUN 34. -Follow-up with BMP -IV fluid:  75 cc/h-patient is on p.o.  Paroxysmal A-fib [facility]): CHA2DS2-VASc Score is 4 , needs oral anticoagulation, but pt is not on AC, not sure why he is not on AC. Heart rate is well controlled. -continue cardizem  Toe pain, right-second: X-ray showed that the appearance of the plantar aspect of the distal phalanx of the second toe is suspicious for osteomyelitis.  Patient does not have a fever or leukocytosis.  I will hold antibiotics now.   -Get  blood culture -will get MRI-right foot to further evaluate.   DVT ppx:  SQ Lovenox Code Status: Full code Family Communication: None at bed side.      Disposition Plan:  Anticipate discharge back to previous home environment Consults called:  none Admission status: Obs / tele          Date of Service 07/16/2019    [name] Costa [facility]   If 7PM-7AM, please contact night-coverage [email or web address] Password [email or web address] 07/16/2019, 10:52 PM

## 2019-07-16 NOTE — ED Provider Notes (Signed)
[facility] EMERGENCY DEPARTMENT Provider Note   CSN: [id number] Arrival date & time: 07/16/19  1117     History   Chief Complaint Chief Complaint  Patient presents with  . Chest Pain  . Gout    HPI Michael [name] is a 68 y.o. male.     HPI Patient presents with right foot pain.  Has been for the last few days.  Worsening swelling in his 2nd toe also.  States he tried to cut the nail.  History of HIV but does not know his CD4 count.  Does not sound as if he is taking the medicines now.  Sees [name]. Also complaining of chest pain.  In his mid chest.  Has been there on and off for the last year.  Has been there since a CABG.  However states recently is been more severe.  States with exertion he gets worse and goes up to his neck.  Also has been using cocaine.  May get worse with cocaine.  Had been at rehab and got back home on Monday and started using around Wednesday.  Continues have pain in her chest.  Not improved with nitroglycerin.  Previous history of gout.  Previous history of pulmonary was not.  Patient has had a mild cough.  States white sputum production. Past Medical History:  Diagnosis Date  . A-fib ([place])   . Asthma    No PFTs, history of childhood asthma  . CAD (coronary artery disease)    a. 06/2013 STEMI/PCI ([facility]): LAD w/ thrombus (treated with BMS), mid 75%, D2 75%; LCX OM2 75%; RCA small, PDA 95%, PLV 95%;  b. 10/2013 Cath/PCI: ISR w/in LAD (Promus DES x 2), borderline OM2 lesion;  c. 01/2014 MV: Intermediate risk, medium-sized distal ant wall infarct w/ very small amt of peri-infarct ischemia. EF 60%.  . Cellulitis 04/2014   left facial  . Chondromalacia of medial femoral condyle    Left knee MRI 04/28/12: Chondromalacia of the medial femoral condyle with slight peripheral degeneration of the meniscocapsular junction of the medial meniscus; followed by sports medicine  . Collagen vascular disease ([place])   . Crack cocaine use    for 20+  years, has been enrolled in detox programs in the past  . Depression    with history of hospitalization for suicidal ideation  . Diabetes mellitus 2002   Diagnosed in 2002, started insulin in 2012  . Gout   . Gout 04/28/2012  . Headache(784.0)    CT head 08/2011: Periventricular and subcortical white matter hypodensities are most in keeping with chronic microangiopathic change  . HIV infection [facility]) Nov 2012   Followed by [name]  . Hyperlipidemia   . Hypertension   . Pulmonary embolism [facility])     Patient Active Problem List   Diagnosis Date Noted  . MDD (major depressive disorder), recurrent episode, severe ([place]) 06/15/2019  . Respiratory distress 04/12/2019  . Pneumonia due to severe acute respiratory syndrome coronavirus 2 (SARS-CoV-2) 04/11/2019  . COVID-19 virus infection 03/19/2019  . Chest pain 10/03/2018  . Malnutrition of moderate degree 09/21/2018  . MDD (major depressive disorder), recurrent episode, moderate ([place])   . Type II diabetes mellitus with renal manifestations ([place]) 05/04/2018  . GERD (gastroesophageal reflux disease) 05/04/2018  . S/P CABG (coronary artery bypass graft)   . Left testicular pain   . Depression 02/20/2018  . Epididymo-orchitis, acute 02/20/2018  . Essential hypertension 02/20/2018  . Precordial chest pain 02/20/2018  . AKI (  acute kidney injury) ([place]) 02/14/2018  . HCAP (healthcare-associated pneumonia) 02/14/2018  . History of pulmonary embolism 07/04/2017  . Acute hyponatremia 05/11/2017  . Hyperglycemia due to type 2 diabetes mellitus ([place]) 05/11/2017  . CHF (congestive heart failure) ([place]) 04/01/2017  . Hepatitis C 12/30/2016  . Chronic ischemic heart disease 11/12/2016  . Paroxysmal A-fib ([place]) 11/12/2016  . Back pain 04/18/2016  . S/P carotid endarterectomy 11/15/2015  . MDD (major depressive disorder), recurrent severe, without psychosis ([place]) 09/09/2015  . Cocaine-induced mood disorder ([place]) 08/14/2015  . Cocaine abuse with  cocaine-induced mood disorder ([place]) 08/14/2015  . Gout 07/10/2015  . Acute renal failure superimposed on stage 3 chronic kidney disease ([place]) 03/06/2015  . Normocytic anemia 03/06/2015  . Chronic kidney disease, stage III (moderate) 03/06/2015  . Hypoglycemia   . Encounter for general adult medical examination with abnormal findings 02/09/2015  . Cocaine use disorder, severe, dependence ([place]) 12/13/2014  . Substance or medication-induced depressive disorder with onset during withdrawal ([place]) 12/13/2014  . Severe recurrent major depressive disorder with psychotic features ([place]) 12/12/2014  . Cervicalgia 06/28/2014  . Lumbar radiculopathy, chronic 06/28/2014  . Asthma, chronic 02/03/2014  . 3-vessel CAD 06/24/2013  . ED (erectile dysfunction) of organic origin 07/07/2012  . Hypertension goal BP (blood pressure) < 140/80 04/29/2012  . Chondromalacia of left knee 03/19/2012  . Hyperlipidemia with target LDL less than 100 02/12/2012  . Fibromyalgia 02/12/2012  . HIV (human immunodeficiency virus infection) ([place]) 08/27/2011  . Uncontrolled type 2 diabetes with neuropathy ([place]) 10/17/2000    Past Surgical History:  Procedure Laterality Date  . BACK SURGERY     1988  . BOWEL RESECTION    . CARDIAC SURGERY    . CERVICAL SPINE SURGERY     " rods in my neck "  . CORONARY ARTERY BYPASS GRAFT    . CORONARY STENT PLACEMENT    . NM MYOCAR PERF WALL MOTION  12/27/2011   normal  . SPINE SURGERY          Home Medications    Prior to Admission medications   Medication Sig Start Date End Date Taking? Authorizing Provider  albuterol (VENTOLIN HFA) 108 (90 Base) MCG/ACT inhaler Inhale 1-2 puffs into the lungs every 6 (six) hours as needed for wheezing or shortness of breath. 06/19/19   Mallie Darting, NP  allopurinol (ZYLOPRIM) 300 MG tablet Take 1 tablet (300 mg total) by mouth daily. For gout 06/20/19   Mallie Darting, NP  aspirin 81 MG chewable tablet Chew 1 tablet (81 mg total) by mouth  daily. Heart health 06/20/19   Mallie Darting, NP  diltiazem (CARDIZEM CD) 120 MG 24 hr capsule Take 1 capsule (120 mg total) by mouth daily. For blood pressure 06/20/19   Mallie Darting, NP  emtricitabine-rilpivir-tenofovir AF (ODEFSEY) 200-25-25 MG TABS tablet Take 1 tablet by mouth daily with breakfast. For HIV infection 06/20/19   Mallie Darting, NP  famotidine (PEPCID) 20 MG tablet Take 1 tablet (20 mg total) by mouth 2 (two) times daily. For heart burn 06/19/19   Mallie Darting, NP  ferrous sulfate 325 (65 FE) MG tablet Take 1 tablet (325 mg total) by mouth 2 (two) times daily with a meal. For low iron 06/19/19   Mallie Darting, NP  fluticasone (FLOVENT HFA) 44 MCG/ACT inhaler Inhale 1 puff into the lungs 2 (two) times daily. For shortness of breath 06/19/19   Mallie Darting, NP  folic acid (FOLVITE) 1 MG  tablet Take 1 tablet (1 mg total) by mouth daily. For folate replacement 06/20/19   Merlyn Lot E, NP  gabapentin (NEURONTIN) 400 MG capsule Take 1 capsule (400 mg total) by mouth 2 (two) times daily. For agitation/neuropathy 06/19/19   Mallie Darting, NP  hydrOXYzine (ATARAX/VISTARIL) 25 MG tablet Take 1 tablet (25 mg total) by mouth 3 (three) times daily as needed for anxiety. 06/19/19   Mallie Darting, NP  insulin glargine (LANTUS) 100 UNIT/ML injection Inject 0.2 mLs (20 Units total) into the skin at bedtime. For high blood sugar 06/19/19   Mallie Darting, NP  metFORMIN (GLUCOPHAGE) 850 MG tablet Take 1 tablet (850 mg total) by mouth 2 (two) times daily with a meal. For high blood sugars 06/19/19   Mallie Darting, NP  nitroGLYCERIN (NITROSTAT) 0.4 MG SL tablet Place 1 tablet (0.4 mg total) under the tongue every 5 (five) minutes as needed for chest pain. 06/19/19   Mallie Darting, NP  polyethylene glycol (MIRALAX / GLYCOLAX) 17 g packet Take 17 g by mouth daily as needed for mild constipation. 06/19/19   Mallie Darting, NP  sertraline (ZOLOFT) 50 MG tablet Take 1 tablet (50 mg total) by  mouth daily. For depression 06/19/19   Mallie Darting, NP  thiamine 100 MG tablet Take 1 tablet (100 mg total) by mouth daily. For thiamine deficiency 06/20/19   Mallie Darting, NP  traZODone (DESYREL) 100 MG tablet Take 1 tablet (100 mg total) by mouth at bedtime as needed for sleep. 06/19/19   Mallie Darting, NP    Family History Family History  Problem Relation Age of Onset  . Diabetes Mother   . Hypertension Mother   . Hyperlipidemia Mother   . Diabetes Father   . Cancer Father   . Hypertension Father   . Diabetes Brother   . Heart disease Brother   . Diabetes Sister   . Colon cancer Neg Hx     Social History Social History   Tobacco Use  . Smoking status: Never Smoker  . Smokeless tobacco: Never Used  Substance Use Topics  . Alcohol use: No    Alcohol/week: 4.0 standard drinks    Types: 2 Cans of beer, 2 Shots of liquor per week  . Drug use: Yes    Types: "Crack" cocaine, Cocaine     Allergies   Patient has no known allergies.   Review of Systems Review of Systems  Constitutional: Negative for appetite change and fever.  HENT: Negative for congestion.   Respiratory: Positive for cough.   Cardiovascular: Positive for chest pain.  Gastrointestinal: Negative for anal bleeding.  Musculoskeletal:       Right foot pain.  Skin: Positive for wound.  Neurological: Negative for weakness.     Physical Exam Updated Vital Signs BP (!) 145/86   Pulse 72   Temp 98.3 F (36.8 C) (Oral)   Resp 13   SpO2 100%   Physical Exam Vitals signs and nursing note reviewed.  HENT:     Head: Normocephalic.  Cardiovascular:     Rate and Rhythm: Normal rate and regular rhythm.     Comments: Some tenderness to anterior chest. Pulmonary:     Breath sounds: Normal breath sounds. No wheezing or rhonchi.  Chest:     Chest wall: Tenderness present.  Musculoskeletal:     Comments: Swelling of the right foot with discoloration.  Some swelling of foot.  No swelling of calf.  Second toe with swelling and discoloration.  Skin:    General: Skin is warm.     Capillary Refill: Capillary refill takes less than 2 seconds.  Neurological:     Mental Status: He is alert.        ED Treatments / Results  Labs (all labs ordered are listed, but only abnormal results are displayed) Labs Reviewed  CBC - Abnormal; Notable for the following components:      Result Value   RBC 3.94 (*)    Hemoglobin 10.4 (*)    HCT 31.4 (*)    MCV 79.7 (*)    All other components within normal limits  BASIC METABOLIC PANEL - Abnormal; Notable for the following components:   Glucose, Bld 339 (*)    BUN 34 (*)    Creatinine, Ser 1.70 (*)    GFR calc non Af Amer 40 (*)    GFR calc Af Amer 47 (*)    All other components within normal limits  SARS CORONAVIRUS 2 (TAT 6-24 HRS)  TROPONIN I (HIGH SENSITIVITY)  TROPONIN I (HIGH SENSITIVITY)    EKG EKG Interpretation  Date/Time:  Friday July 16 2019 11:24:57 EDT Ventricular Rate:  89 PR Interval:  132 QRS Duration: 84 QT Interval:  386 QTC Calculation: 469 R Axis:   62 Text Interpretation:  Sinus rhythm with marked sinus arrhythmia Nonspecific T wave abnormality Prolonged QT Abnormal ECG Confirmed by Davonna Belling [phone]) on 07/16/2019 3:49:59 PM   Radiology Dg Chest 2 View  Result Date: 07/16/2019 CLINICAL DATA:  Chest pain EXAM: CHEST - 2 VIEW COMPARISON:  Chest radiograph dated 04/11/2019 FINDINGS: The heart size and mediastinal contours are within normal limits. Median sternotomy wires, vascular stents, and a left atrial appendage clip are redemonstrated. There is elevation of the right hemidiaphragm. Both lungs are clear. Degenerative changes are seen in the spine. IMPRESSION: No acute cardiopulmonary findings. Electronically Signed   By: Zerita Boers M.D.   On: 07/16/2019 12:13    Procedures Procedures (including critical care time)  Medications Ordered in ED Medications  sodium chloride flush (NS) 0.9 %  injection 3 mL (3 mLs Intravenous Not Given 07/16/19 1553)     Initial Impression / Assessment and Plan / ED Course  I have reviewed the triage vital signs and the nursing notes.  Pertinent labs & imaging results that were available during my care of the patient were reviewed by me and considered in my medical decision making (see chart for details).        Patient presents with both chest pain and toe infection.  Chest pain is been going for last year but has had pain that he says gets worse with exertion.  Has been previously seen at [facility] for [facility].  Had a CABG around a year ago but known three-vessel coronary artery disease.  Do not see that he has had much testing to evaluate this chest pain besides enzymes.  Is somewhat worrisome with noncompliance.  EKG reassuring.  Troponin stable at 10. Also has swelling and color change of the right second toe.  Likely cellulitis versus abscess.  Also history of gout but this seems more distal on the toe.  DVT felt less likely.  Likely need antibiotic.  Also HIV with noncompliance.  Will discuss with hospitalist about admission.  Final Clinical Impressions(s) / ED Diagnoses   Final diagnoses:  Abscess or cellulitis of toe, right  Chest pain, unspecified type  Cocaine abuse ([place])  HIV infection, unspecified  symptom status [facility])    ED Discharge Orders    None       Davonna Belling, MD 07/16/19 [phone]

## 2019-07-16 NOTE — ED Notes (Signed)
IV team at bedside 

## 2019-07-16 NOTE — Progress Notes (Signed)
ANTICOAGULATION CONSULT NOTE - Initial Consult  Pharmacy Consult for Heparin Indication: atrial fibrillation and pulmonary embolus  No Known Allergies  Patient Measurements:   Heparin Dosing Weight: 75 kg  Vital Signs: Temp: 98.3 F (36.8 C) (10/09 1427) Temp Source: Oral (10/09 1427) BP: 136/84 (10/09 1800) Pulse Rate: 72 (10/09 1800)  Labs: Recent Labs    07/16/19 1130 07/16/19 1452  HGB 10.4*  --   HCT 31.4*  --   PLT 167  --   CREATININE  --  1.70*  TROPONINIHS 10 10    CrCl cannot be calculated (Unknown ideal weight.).   Medical History: Past Medical History:  Diagnosis Date  . A-fib ([place])   . Asthma    No PFTs, history of childhood asthma  . CAD (coronary artery disease)    a. 06/2013 STEMI/PCI ([facility]): LAD w/ thrombus (treated with BMS), mid 75%, D2 75%; LCX OM2 75%; RCA small, PDA 95%, PLV 95%;  b. 10/2013 Cath/PCI: ISR w/in LAD (Promus DES x 2), borderline OM2 lesion;  c. 01/2014 MV: Intermediate risk, medium-sized distal ant wall infarct w/ very small amt of peri-infarct ischemia. EF 60%.  . Cellulitis 04/2014   left facial  . Chondromalacia of medial femoral condyle    Left knee MRI 04/28/12: Chondromalacia of the medial femoral condyle with slight peripheral degeneration of the meniscocapsular junction of the medial meniscus; followed by sports medicine  . Collagen vascular disease ([place])   . Crack cocaine use    for 20+ years, has been enrolled in detox programs in the past  . Depression    with history of hospitalization for suicidal ideation  . Diabetes mellitus 2002   Diagnosed in 2002, started insulin in 2012  . Gout   . Gout 04/28/2012  . Headache(784.0)    CT head 08/2011: Periventricular and subcortical white matter hypodensities are most in keeping with chronic microangiopathic change  . HIV infection [facility]) Nov 2012   Followed by [name]  . Hyperlipidemia   . Hypertension   . Pulmonary embolism [facility])    Assessment: Michael [name] is a 67  y/o male with PMH of A-fib (not on anticoagulation PTA), asthma, CAD, cellulitis, cocaine use, DM, gout, headache, HIV, HLD, HTN, and PE. He presents with right foot pain and chest pain. Troponins are wnls. Chest pain worsens with exertion/cocaine use. Chest pain not relieved with nitroglycerin. Pt had a CABG around a year ago for three-vessel CAD. Due to elevated D-dimer, there is concern for PE. Pharmacy has been consulted for heparin dosing. CBC and plts low but are wnls.  Goal of Therapy:  Heparin level 0.3-0.7 units/ml Monitor platelets by anticoagulation protocol: Yes   Plan:  Give 4500 units bolus x 1 Start heparin infusion at 1200 units/hr Check anti-Xa level in 6 hours and daily while on heparin Continue to monitor H&H and platelets daily Monitor for signs/symptoms of bleeding  Sherren Kerns, PharmD PGY1 Acute Care Pharmacy Resident 07/16/2019,6:46 PM

## 2019-07-16 NOTE — ED Notes (Signed)
ED TO INPATIENT HANDOFF REPORT  ED Nurse Name and Phone #: [phone]  S Name/Age/Gender Michael [name] 68 y.o. male Room/Bed: 027C/027C  Code Status   Code Status: Full Code  Home/SNF/Other Home Patient oriented to: self, place, time and situation Is this baseline? Yes   Triage Complete: Triage complete  Chief Complaint Cocaine use  Triage Note Pt arrives via [facility] from home for c/o chest pain and foot pain from what he thinks is gout. States he used cocaine last night. Recently d/c from rehab. [facility] v[facility]. Pt received 324mg  asa, 1 sl nitro with no relief of pain. Pt a/ox4, resp e/u, nad.    Allergies No Known Allergies  Level of Care/Admitting Diagnosis ED Disposition    ED Disposition Condition [facility] Area: [place] [[id number]]  Level of Care: Telemetry Cardiac [103]  I expect the patient will be discharged within 24 hours: No (not a candidate for 5C-Observation unit)  Covid Evaluation: Asymptomatic Screening Protocol (No Symptoms)  Diagnosis: Chest pain [324401]  Admitting Physician: [name] Costa [[id number]]  Attending Physician: [name] Costa [[id number]]  PT Class (Do Not Modify): Observation [104]  PT Acc Code (Do Not Modify): Observation [10022]       B Medical/Surgery History Past Medical History:  Diagnosis Date  . A-fib ([place])   . Asthma    No PFTs, history of childhood asthma  . CAD (coronary artery disease)    a. 06/2013 STEMI/PCI ([facility]): LAD w/ thrombus (treated with BMS), mid 75%, D2 75%; LCX OM2 75%; RCA small, PDA 95%, PLV 95%;  b. 10/2013 Cath/PCI: ISR w/in LAD (Promus DES x 2), borderline OM2 lesion;  c. 01/2014 MV: Intermediate risk, medium-sized distal ant wall infarct w/ very small amt of peri-infarct ischemia. EF 60%.  . Cellulitis 04/2014   left facial  . Chondromalacia of medial femoral condyle    Left knee MRI 04/28/12: Chondromalacia of the medial femoral condyle with slight peripheral degeneration of the  meniscocapsular junction of the medial meniscus; followed by sports medicine  . Collagen vascular disease ([place])   . Crack cocaine use    for 20+ years, has been enrolled in detox programs in the past  . Depression    with history of hospitalization for suicidal ideation  . Diabetes mellitus 2002   Diagnosed in 2002, started insulin in 2012  . Gout   . Gout 04/28/2012  . Headache(784.0)    CT head 08/2011: Periventricular and subcortical white matter hypodensities are most in keeping with chronic microangiopathic change  . HIV infection [facility]) Nov 2012   Followed by [name]  . Hyperlipidemia   . Hypertension   . Pulmonary embolism [facility])    Past Surgical History:  Procedure Laterality Date  . BACK SURGERY     1988  . BOWEL RESECTION    . CARDIAC SURGERY    . CERVICAL SPINE SURGERY     " rods in my neck "  . CORONARY ARTERY BYPASS GRAFT    . CORONARY STENT PLACEMENT    . NM MYOCAR PERF WALL MOTION  12/27/2011   normal  . SPINE SURGERY       A IV Location/Drains/Wounds Patient Lines/Drains/Airways Status   Active Line/Drains/Airways    Name:   Placement date:   Placement time:   Site:   Days:   Peripheral IV 07/16/19 Posterior;Right Forearm   07/16/19    1910    Forearm   less than 1  Intake/Output Last 24 hours No intake or output data in the 24 hours ending 07/16/19 2029  Labs/Imaging Results for orders placed or performed during the hospital encounter of 07/16/19 (from the past 48 hour(s))  CBC     Status: Abnormal   Collection Time: 07/16/19 11:30 AM  Result Value Ref Range   WBC 6.4 4.0 - 10.5 K/uL   RBC 3.94 (L) 4.22 - 5.81 MIL/uL   Hemoglobin 10.4 (L) 13.0 - 17.0 g/dL   HCT 31.4 (L) 39.0 - 52.0 %   MCV 79.7 (L) 80.0 - 100.0 fL   MCH 26.4 26.0 - 34.0 pg   MCHC 33.1 30.0 - 36.0 g/dL   RDW 14.0 11.5 - 15.5 %   Platelets 167 150 - 400 K/uL   nRBC 0.0 0.0 - 0.2 %    Comment: Performed at [facility], [place] [address], [place], [place]  [address]  Troponin I (High Sensitivity)     Status: None   Collection Time: 07/16/19 11:30 AM  Result Value Ref Range   Troponin I (High Sensitivity) 10 <18 ng/L    Comment: (NOTE) Elevated high sensitivity troponin I (hsTnI) values and significant  changes across serial measurements may suggest ACS but many other  chronic and acute conditions are known to elevate hsTnI results.  Refer to the Links section for chest pain algorithms and additional  guidance. Performed at [facility], [place] [address], [place], [place] [address]   Troponin I (High Sensitivity)     Status: None   Collection Time: 07/16/19  2:52 PM  Result Value Ref Range   Troponin I (High Sensitivity) 10 <18 ng/L    Comment: (NOTE) Elevated high sensitivity troponin I (hsTnI) values and significant  changes across serial measurements may suggest ACS but many other  chronic and acute conditions are known to elevate hsTnI results.  Refer to the "Links" section for chest pain algorithms and additional  guidance. Performed at [facility], [place] [address], [place], [place] [address]   Basic metabolic panel     Status: Abnormal   Collection Time: 07/16/19  2:52 PM  Result Value Ref Range   Sodium 139 135 - 145 mmol/L   Potassium 3.9 3.5 - 5.1 mmol/L   Chloride 104 98 - 111 mmol/L   CO2 23 22 - 32 mmol/L   Glucose, Bld 339 (H) 70 - 99 mg/dL   BUN 34 (H) 8 - 23 mg/dL   Creatinine, Ser 1.70 (H) 0.61 - 1.24 mg/dL   Calcium 9.4 8.9 - 10.3 mg/dL   GFR calc non Af Amer 40 (L) >60 mL/min   GFR calc Af Amer 47 (L) >60 mL/min   Anion gap 12 5 - 15    Comment: Performed at [place] [address], [place], [place] [address]  D-dimer, quantitative (not at [facility])     Status: Abnormal   Collection Time: 07/16/19  5:30 PM  Result Value Ref Range   D-Dimer, Quant 1.26 (H) 0.00 - 0.50 ug/mL-FEU    Comment: (NOTE) At the manufacturer cut-off of 0.50 ug/mL FEU, this assay has been documented to exclude PE with  a sensitivity and negative predictive value of 97 to 99%.  At this time, this assay has not been approved by the FDA to exclude DVT/VTE. Results should be correlated with clinical presentation. Performed at [facility], [place] [address], [place], [place] [address]    Dg Chest 2 View  Result Date: 07/16/2019 CLINICAL DATA:  Chest pain EXAM: CHEST - 2 VIEW COMPARISON:  Chest radiograph dated 04/11/2019 FINDINGS: The heart size and mediastinal contours are within normal limits. Median sternotomy wires, vascular stents, and a left atrial appendage clip are redemonstrated. There is elevation of the right hemidiaphragm. Both lungs are clear. Degenerative changes are seen in the spine. IMPRESSION: No acute cardiopulmonary findings. Electronically Signed   By: Zerita Boers M.D.   On: 07/16/2019 12:13   Dg Foot Complete Right  Result Date: 07/16/2019 CLINICAL DATA:  Infection of the second toe. Swelling. EXAM: RIGHT FOOT COMPLETE - 3+ VIEW COMPARISON:  None. FINDINGS: There is swelling of the soft tissues of the distal second toe. On the lateral view the plantar aspect of the distal phalanx the second toe appears to be eroded away. This is not appreciable on the AP and oblique views. Old healed fractures of the fourth and fifth metatarsal shafts. Slight arthritic changes of the first MTP joint. IMPRESSION: 1. Soft tissue swelling of the distal second toe. 2. The appearance of the plantar aspect of the distal phalanx of the second toe is suspicious for osteomyelitis. 3. Arthritic changes of the first MTP joint. Electronically Signed   By: Lorriane Shire M.D.   On: 07/16/2019 16:42    Pending Labs Unresulted Labs (From admission, onward)    Start     Ordered   07/18/19 0500  Heparin level (unfractionated)  Daily,   R     07/16/19 1844   07/17/19 0500  Hemoglobin A1c  Tomorrow morning,   R     07/16/19 1955   07/17/19 0500  Lipid panel  Tomorrow morning,   R    Comments: Please obtain as a fasting  lipid panel - should not have eaten/ drank food for 8 hours prior to labs.    07/16/19 1955   07/17/19 0500  CBC  Daily,   R     07/16/19 1844   07/17/19 0100  Heparin level (unfractionated)  Once-Timed,   STAT     07/16/19 1844   07/16/19 1956  Culture, blood (Routine X 2) w Reflex to ID Panel  BLOOD CULTURE X 2,   R (with STAT occurrences)    Comments: Please obtain prior to antibiotic administration.    07/16/19 1955   07/16/19 1956  Rapid urine drug screen (hospital performed)  Once,   STAT     07/16/19 1955   07/16/19 1956  Uric acid  Once,   STAT     07/16/19 1955   07/16/19 1956  Brain natriuretic peptide  Once,   STAT     07/16/19 1955   07/16/19 1635  SARS CORONAVIRUS 2 (TAT 6-24 HRS) Nasopharyngeal Nasopharyngeal Swab  (Asymptomatic/Tier 2 Patients Labs)  Once,   STAT    Question Answer Comment  Is this test for diagnosis or screening Screening   Symptomatic for COVID-19 as defined by CDC No   Hospitalized for COVID-19 No   Admitted to ICU for COVID-19 No   Previously tested for COVID-19 Yes   Resident in a congregate (group) care setting No   Employed in healthcare setting No      07/16/19 1634          Vitals/Pain Today's Vitals   07/16/19 1847 07/16/19 1933 07/16/19 1933 07/16/19 1936  BP:  (!) 156/95    Pulse:  72    Resp:  17    Temp:      TempSrc:  SpO2:  100%    PainSc: 9   9  9      Isolation Precautions No active isolations  Medications Medications  sodium chloride flush (NS) 0.9 % injection 3 mL (3 mLs Intravenous Not Given 07/16/19 1553)  insulin aspart (novoLOG) injection 0-9 Units (has no administration in time range)  oxyCODONE-acetaminophen (PERCOCET/ROXICET) 5-325 MG per tablet 1 tablet (has no administration in time range)  acetaminophen (TYLENOL) tablet 650 mg (has no administration in time range)  ondansetron (ZOFRAN) injection 4 mg (has no administration in time range)  enoxaparin (LOVENOX) injection 40 mg (0 mg Subcutaneous Hold  07/16/19 1956)  hydrALAZINE (APRESOLINE) tablet 25 mg (has no administration in time range)  oxyCODONE-acetaminophen (PERCOCET/ROXICET) 5-325 MG per tablet 1 tablet (1 tablet Oral Given 07/16/19 1846)  heparin ADULT infusion 100 units/mL (25000 units/217mL sodium chloride 0.45%) (1,200 Units/hr Intravenous New Bag/Given 07/16/19 1948)  heparin bolus via infusion 4,500 Units (4,500 Units Intravenous Bolus from Bag 07/16/19 1948)    Mobility walks with person assist Low fall risk   Focused Assessments Cardiac Assessment Handoff:  Cardiac Rhythm: Normal sinus rhythm Lab Results  Component Value Date   CKTOTAL 172 03/06/2015   CKMB 5.0 (H) 08/27/2011   TROPONINI <0.03 01/10/2019   Lab Results  Component Value Date   DDIMER 1.26 (H) 07/16/2019   Does the Patient currently have chest pain? No     R Recommendations: See Admitting Provider Note  Report given to:   Additional Notes: -

## 2019-07-16 NOTE — ED Triage Notes (Addendum)
Pt arrives via [facility] from home for c/o chest pain and foot pain from what he thinks is gout. States he used cocaine last night. Recently d/c from rehab. [facility] v[facility]. Pt received 324mg  asa, 1 sl nitro with no relief of pain. Pt a/ox4, resp e/u, nad.

## 2019-07-17 ENCOUNTER — Encounter (HOSPITAL_COMMUNITY): Payer: Medicare Other

## 2019-07-17 ENCOUNTER — Observation Stay (HOSPITAL_COMMUNITY): Payer: Medicare Other

## 2019-07-17 DIAGNOSIS — R079 Chest pain, unspecified: Secondary | ICD-10-CM

## 2019-07-17 DIAGNOSIS — K219 Gastro-esophageal reflux disease without esophagitis: Secondary | ICD-10-CM | POA: Diagnosis present

## 2019-07-17 DIAGNOSIS — L03031 Cellulitis of right toe: Secondary | ICD-10-CM | POA: Diagnosis present

## 2019-07-17 DIAGNOSIS — N1831 Chronic kidney disease, stage 3a: Secondary | ICD-10-CM | POA: Diagnosis not present

## 2019-07-17 DIAGNOSIS — F333 Major depressive disorder, recurrent, severe with psychotic symptoms: Secondary | ICD-10-CM | POA: Diagnosis present

## 2019-07-17 DIAGNOSIS — F1420 Cocaine dependence, uncomplicated: Secondary | ICD-10-CM | POA: Diagnosis present

## 2019-07-17 DIAGNOSIS — I2510 Atherosclerotic heart disease of native coronary artery without angina pectoris: Secondary | ICD-10-CM | POA: Diagnosis present

## 2019-07-17 DIAGNOSIS — M109 Gout, unspecified: Secondary | ICD-10-CM | POA: Diagnosis not present

## 2019-07-17 DIAGNOSIS — M869 Osteomyelitis, unspecified: Secondary | ICD-10-CM | POA: Diagnosis present

## 2019-07-17 DIAGNOSIS — I96 Gangrene, not elsewhere classified: Secondary | ICD-10-CM | POA: Diagnosis present

## 2019-07-17 DIAGNOSIS — D631 Anemia in chronic kidney disease: Secondary | ICD-10-CM | POA: Diagnosis present

## 2019-07-17 DIAGNOSIS — I2699 Other pulmonary embolism without acute cor pulmonale: Secondary | ICD-10-CM | POA: Diagnosis not present

## 2019-07-17 DIAGNOSIS — E1151 Type 2 diabetes mellitus with diabetic peripheral angiopathy without gangrene: Secondary | ICD-10-CM | POA: Diagnosis present

## 2019-07-17 DIAGNOSIS — E1152 Type 2 diabetes mellitus with diabetic peripheral angiopathy with gangrene: Secondary | ICD-10-CM | POA: Diagnosis present

## 2019-07-17 DIAGNOSIS — M79674 Pain in right toe(s): Secondary | ICD-10-CM | POA: Diagnosis present

## 2019-07-17 DIAGNOSIS — M86271 Subacute osteomyelitis, right ankle and foot: Secondary | ICD-10-CM | POA: Diagnosis not present

## 2019-07-17 DIAGNOSIS — E1165 Type 2 diabetes mellitus with hyperglycemia: Secondary | ICD-10-CM | POA: Diagnosis present

## 2019-07-17 DIAGNOSIS — L97519 Non-pressure chronic ulcer of other part of right foot with unspecified severity: Secondary | ICD-10-CM | POA: Diagnosis not present

## 2019-07-17 DIAGNOSIS — Z20828 Contact with and (suspected) exposure to other viral communicable diseases: Secondary | ICD-10-CM | POA: Diagnosis present

## 2019-07-17 DIAGNOSIS — E875 Hyperkalemia: Secondary | ICD-10-CM | POA: Diagnosis not present

## 2019-07-17 DIAGNOSIS — L0390 Cellulitis, unspecified: Secondary | ICD-10-CM | POA: Diagnosis not present

## 2019-07-17 DIAGNOSIS — E1122 Type 2 diabetes mellitus with diabetic chronic kidney disease: Secondary | ICD-10-CM | POA: Diagnosis present

## 2019-07-17 DIAGNOSIS — N183 Chronic kidney disease, stage 3 unspecified: Secondary | ICD-10-CM | POA: Diagnosis present

## 2019-07-17 DIAGNOSIS — M94262 Chondromalacia, left knee: Secondary | ICD-10-CM | POA: Diagnosis present

## 2019-07-17 DIAGNOSIS — D509 Iron deficiency anemia, unspecified: Secondary | ICD-10-CM | POA: Diagnosis present

## 2019-07-17 DIAGNOSIS — E785 Hyperlipidemia, unspecified: Secondary | ICD-10-CM | POA: Diagnosis present

## 2019-07-17 DIAGNOSIS — I129 Hypertensive chronic kidney disease with stage 1 through stage 4 chronic kidney disease, or unspecified chronic kidney disease: Secondary | ICD-10-CM | POA: Diagnosis present

## 2019-07-17 DIAGNOSIS — L02611 Cutaneous abscess of right foot: Secondary | ICD-10-CM | POA: Diagnosis not present

## 2019-07-17 DIAGNOSIS — J45909 Unspecified asthma, uncomplicated: Secondary | ICD-10-CM | POA: Diagnosis present

## 2019-07-17 DIAGNOSIS — E1169 Type 2 diabetes mellitus with other specified complication: Secondary | ICD-10-CM | POA: Diagnosis present

## 2019-07-17 DIAGNOSIS — F1410 Cocaine abuse, uncomplicated: Secondary | ICD-10-CM | POA: Diagnosis not present

## 2019-07-17 DIAGNOSIS — I480 Paroxysmal atrial fibrillation: Secondary | ICD-10-CM | POA: Diagnosis present

## 2019-07-17 DIAGNOSIS — B20 Human immunodeficiency virus [HIV] disease: Secondary | ICD-10-CM | POA: Diagnosis present

## 2019-07-17 DIAGNOSIS — N179 Acute kidney failure, unspecified: Secondary | ICD-10-CM | POA: Diagnosis present

## 2019-07-17 LAB — CBC
HCT: 26.3 % — ABNORMAL LOW (ref 39.0–52.0)
Hemoglobin: 8.8 g/dL — ABNORMAL LOW (ref 13.0–17.0)
MCH: 26 pg (ref 26.0–34.0)
MCHC: 33.5 g/dL (ref 30.0–36.0)
MCV: 77.8 fL — ABNORMAL LOW (ref 80.0–100.0)
Platelets: 142 10*3/uL — ABNORMAL LOW (ref 150–400)
RBC: 3.38 MIL/uL — ABNORMAL LOW (ref 4.22–5.81)
RDW: 14 % (ref 11.5–15.5)
WBC: 4.5 10*3/uL (ref 4.0–10.5)
nRBC: 0 % (ref 0.0–0.2)

## 2019-07-17 LAB — C-REACTIVE PROTEIN: CRP: 3.9 mg/dL — ABNORMAL HIGH (ref <1.0)

## 2019-07-17 LAB — HEMOGLOBIN A1C
Hgb A1c MFr Bld: 9.7 % — ABNORMAL HIGH (ref 4.8–5.6)
Mean Plasma Glucose: 231.69 mg/dL

## 2019-07-17 LAB — LIPID PANEL
Cholesterol: 178 mg/dL (ref 0–200)
HDL: 54 mg/dL (ref >40)
LDL Cholesterol: 110 mg/dL — ABNORMAL HIGH (ref 0–99)
Total CHOL/HDL Ratio: 3.3 RATIO
Triglycerides: 68 mg/dL (ref <150)
VLDL: 14 mg/dL (ref 0–40)

## 2019-07-17 LAB — GLUCOSE, CAPILLARY
Glucose-Capillary: 147 mg/dL — ABNORMAL HIGH (ref 70–99)
Glucose-Capillary: 140 mg/dL — ABNORMAL HIGH (ref 70–99)
Glucose-Capillary: 133 mg/dL — ABNORMAL HIGH (ref 70–99)
Glucose-Capillary: 162 mg/dL — ABNORMAL HIGH (ref 70–99)

## 2019-07-17 LAB — PREALBUMIN: Prealbumin: 15.8 mg/dL — ABNORMAL LOW (ref 18–38)

## 2019-07-17 LAB — HEPARIN LEVEL (UNFRACTIONATED)
Heparin Unfractionated: 0.57 IU/mL (ref 0.30–0.70)
Heparin Unfractionated: 0.7 IU/mL (ref 0.30–0.70)
Heparin Unfractionated: 0.8 IU/mL — ABNORMAL HIGH (ref 0.30–0.70)

## 2019-07-17 LAB — URIC ACID: Uric Acid, Serum: 9.7 mg/dL — ABNORMAL HIGH (ref 3.7–8.6)

## 2019-07-17 LAB — TROPONIN I (HIGH SENSITIVITY): Troponin I (High Sensitivity): 8 ng/L (ref <18)

## 2019-07-17 LAB — SEDIMENTATION RATE: Sed Rate: 48 mm/hr — ABNORMAL HIGH (ref 0–16)

## 2019-07-17 LAB — BRAIN NATRIURETIC PEPTIDE: B Natriuretic Peptide: 146.1 pg/mL — ABNORMAL HIGH (ref 0.0–100.0)

## 2019-07-17 MED ORDER — JUVEN PO PACK
1.0000 | PACK | Freq: Two times a day (BID) | ORAL | Status: DC
  Administered 2019-07-18 – 2019-08-04 (×30): 1 via ORAL
  Filled 2019-07-17 (×31): qty 1

## 2019-07-17 MED ORDER — METRONIDAZOLE IN NACL 5-0.79 MG/ML-% IV SOLN
500.0000 mg | Freq: Three times a day (TID) | INTRAVENOUS | Status: AC
  Administered 2019-07-17 – 2019-07-22 (×17): 500 mg via INTRAVENOUS
  Filled 2019-07-17 (×19): qty 100

## 2019-07-17 MED ORDER — TECHNETIUM TO 99M ALBUMIN AGGREGATED
1.5100 | Freq: Once | INTRAVENOUS | Status: AC | PRN
  Administered 2019-07-17: 1.51 via INTRAVENOUS

## 2019-07-17 MED ORDER — SODIUM CHLORIDE 0.9 % IV SOLN
2.0000 g | INTRAVENOUS | Status: AC
  Administered 2019-07-17 – 2019-07-22 (×6): 2 g via INTRAVENOUS
  Filled 2019-07-17 (×2): qty 20
  Filled 2019-07-17 (×5): qty 2

## 2019-07-17 MED ORDER — OCUVITE-LUTEIN PO CAPS
1.0000 | ORAL_CAPSULE | Freq: Every day | ORAL | Status: DC
  Administered 2019-07-17: 1 via ORAL
  Filled 2019-07-17 (×2): qty 1

## 2019-07-17 MED ORDER — ENSURE ENLIVE PO LIQD
237.0000 mL | Freq: Two times a day (BID) | ORAL | Status: DC
  Administered 2019-07-18 – 2019-07-28 (×16): 237 mL via ORAL

## 2019-07-17 NOTE — Progress Notes (Signed)
Initial Nutrition Assessment  DOCUMENTATION CODES:   Not applicable  INTERVENTION:  -Ensure Enlive po BID, each supplement provides 350 kcal and 20 grams of protein -Juven BID, each packet provides 95 calories, 2.5 grams of protein (collagen), and 9.8 grams of carbohydrate (3 grams sugar); also contains 7 grams of L-arginine and L-glutamine, 300 mg vitamin C, 15 mg vitamin E, 1.2 mcg vitamin B-12, 9.5 mg zinc, 200 mg calcium, and 1.5 g  Calcium Beta-hydroxy-Beta-methylbutyrate to support wound healing -Ocuvite Lutein daily, each MVI provides 200 mg vit C, 40 mg zinc, 55 mcg selenium, 2 mg copper, 2 mg lutein  NUTRITION DIAGNOSIS:   Increased nutrient needs related to wound healing, chronic illness(osteomyelitis of right toe; HIV) as evidenced by estimated needs.    GOAL:   Patient will meet greater than or equal to 90% of their needs    MONITOR:   PO intake, Labs, I & O's, Supplement acceptance, Weight trends, Skin  REASON FOR ASSESSMENT:   Consult Wound healing  ASSESSMENT:  RD working remotely.  68 year old male with medical history significant of HTN, HLD, T2DM, asthma, GERD, gout, cocaine abuse, PE, HIV, CAD s/p CABG, s/p stent placment, A-fib, CKDIII, iron deficiency anemia who presents with chest and toe pain.  Per chart review, pt reports mild intermittent chest pain for more than 1 year since CABG; worsening over the past 2 days. Patient reports using cocaine on Thursday; CXR negative. Given history of PE not on anticoagulants and elevated d-dimer he was started on IV heparin for possible PE. X-ray of right foot showed possible distal second toe osteomyelitis.  MRI: findings consistent with osteomyelitis throughout distal phalanx of second toe. Fluid collection consistent with abscess or blister; ortho consulted.  Unable to obtain nutrition history at this time, per chart review patient with RVS for BLE venous duplex. Patient eating 70% x 1 recorded meal. Will  continue to monitor po intake. At this time will provide Juven protein supplement to promote wound healing as well as Ensure to aid with calorie and protein needs.   Current wt 73 kg (160.6 lb) Weight history reviewed: stable  I/Os: +545 ml since admit UOP: 400 ml since admit  Medications reviewed and include: zyloprim, odefsey, pepcid, folic acid, gabapentin, SSI, lantus, thiamine, rocephin, flagyl  Labs: CBGS 140-212 Lab Results  Component Value Date   HGBA1C 9.7 (H) 07/17/2019     NUTRITION - FOCUSED PHYSICAL EXAM: Unable to complete at this time; RD working remotely.    Diet Order:   Diet Order            Diet Carb Modified Fluid consistency: Thin; Room service appropriate? Yes  Diet effective now              EDUCATION NEEDS:   No education needs have been identified at this time  Skin:  Skin Assessment: Reviewed RN Assessment  Last BM:  10/7  Height:   Ht Readings from Last 1 Encounters:  07/16/19 5\' 8"  (1.727 m)    Weight:   Wt Readings from Last 1 Encounters:  07/17/19 73 kg    Ideal Body Weight:  70 kg  BMI:  Body mass index is 24.47 kg/m.  Estimated Nutritional Needs:   Kcal:  3235-5732  Protein:  110-120  Fluid:  >/= 2 L/day  Lajuan Lines, RD, LDN Clinical Nutrition [facility] Telephone [phone] After Hours/Weekend Pager: [phone]

## 2019-07-17 NOTE — Progress Notes (Signed)
ANTICOAGULATION CONSULT NOTE  Pharmacy Consult for Heparin Indication: atrial fibrillation and pulmonary embolus  No Known Allergies  Patient Measurements: Height: 5\' 8"  (172.7 cm) Weight: 160 lb 15 oz (73 kg) IBW/kg (Calculated) : 68.4 Heparin Dosing Weight: 75 kg  Vital Signs: Temp: 97.5 F (36.4 C) (10/10 1333) Temp Source: Oral (10/10 1333) BP: 155/83 (10/10 1333) Pulse Rate: 73 (10/10 1333)  Labs: Recent Labs    07/16/19 1130 07/16/19 1452 07/17/19 0043 07/17/19 0735 07/17/19 1455  HGB 10.4*  --  8.8*  --   --   HCT 31.4*  --  26.3*  --   --   PLT 167  --  142*  --   --   HEPARINUNFRC  --   --  0.57 0.70 0.80*  CREATININE  --  1.70*  --   --   --   TROPONINIHS 10 10 8   --   --     Estimated Creatinine Clearance: 39.7 mL/min (A) (by C-G formula based on SCr of 1.7 mg/dL (H)).   Medical History: Past Medical History:  Diagnosis Date  . A-fib ([place])   . Asthma    No PFTs, history of childhood asthma  . CAD (coronary artery disease)    a. 06/2013 STEMI/PCI ([facility]): LAD w/ thrombus (treated with BMS), mid 75%, D2 75%; LCX OM2 75%; RCA small, PDA 95%, PLV 95%;  b. 10/2013 Cath/PCI: ISR w/in LAD (Promus DES x 2), borderline OM2 lesion;  c. 01/2014 MV: Intermediate risk, medium-sized distal ant wall infarct w/ very small amt of peri-infarct ischemia. EF 60%.  . Cellulitis 04/2014   left facial  . Chondromalacia of medial femoral condyle    Left knee MRI 04/28/12: Chondromalacia of the medial femoral condyle with slight peripheral degeneration of the meniscocapsular junction of the medial meniscus; followed by sports medicine  . Collagen vascular disease ([place])   . Crack cocaine use    for 20+ years, has been enrolled in detox programs in the past  . Depression    with history of hospitalization for suicidal ideation  . Diabetes mellitus 2002   Diagnosed in 2002, started insulin in 2012  . Gout   . Gout 04/28/2012  . Headache(784.0)    CT head 08/2011: Periventricular  and subcortical white matter hypodensities are most in keeping with chronic microangiopathic change  . HIV infection [facility]) Nov 2012   Followed by [name]  . Hyperlipidemia   . Hypertension   . Pulmonary embolism [facility])    Assessment: Michael [name] is a 67 y/o male with PMH of A-fib (not on anticoagulation PTA), asthma, CAD, cellulitis, cocaine use, DM, gout, headache, HIV, HLD, HTN, and PE. He presents with right foot pain and chest pain. Troponins are WNL. Chest pain worsens with exertion/cocaine use. Chest pain not relieved with nitroglycerin. Pt had a CABG around a year ago for three-vessel CAD. Due to elevated D-dimer, there is concern for PE. Pharmacy has been consulted for heparin dosing.   CT chest on 10/9 showed no acute findings. LE doppler US planned today to r/o DVT.  Heparin level this afternoon went up to 0.8 despite heparin rate reduction earlier today.  Called RN, it appears most recent heparin level drawn very close to where heparin infusing.  Patient is a difficult stick.  Goal of Therapy:  Heparin level 0.3-0.7 units/ml Monitor platelets by anticoagulation protocol: Yes   Plan:  Decrease heparin infusion to 1100 units/hr Check confirmatory heparin level in 8 hours  Monitor daily  heparin level and CBC Continue to monitor H&H and platelets daily Monitor for signs/symptoms of bleeding  Marguerite Olea, [facility] Clinical Pharmacist Phone [phone]  07/17/2019 3:52 PM

## 2019-07-17 NOTE — Consult Note (Signed)
ORTHOPAEDIC CONSULTATION  REQUESTING PHYSICIAN: Geradine Girt, DO  Chief Complaint: Ulceration swelling right second toe.  HPI: Michael [name] is a 68 y.o. male who presents with osteomyelitis right second toe.  Patient states that he is status post CABG last year he states he has chest pain.  Patient also complains of gout.  Past Medical History:  Diagnosis Date  . A-fib ([place])   . Asthma    No PFTs, history of childhood asthma  . CAD (coronary artery disease)    a. 06/2013 STEMI/PCI ([facility]): LAD w/ thrombus (treated with BMS), mid 75%, D2 75%; LCX OM2 75%; RCA small, PDA 95%, PLV 95%;  b. 10/2013 Cath/PCI: ISR w/in LAD (Promus DES x 2), borderline OM2 lesion;  c. 01/2014 MV: Intermediate risk, medium-sized distal ant wall infarct w/ very small amt of peri-infarct ischemia. EF 60%.  . Cellulitis 04/2014   left facial  . Chondromalacia of medial femoral condyle    Left knee MRI 04/28/12: Chondromalacia of the medial femoral condyle with slight peripheral degeneration of the meniscocapsular junction of the medial meniscus; followed by sports medicine  . Collagen vascular disease ([place])   . Crack cocaine use    for 20+ years, has been enrolled in detox programs in the past  . Depression    with history of hospitalization for suicidal ideation  . Diabetes mellitus 2002   Diagnosed in 2002, started insulin in 2012  . Gout   . Gout 04/28/2012  . Headache(784.0)    CT head 08/2011: Periventricular and subcortical white matter hypodensities are most in keeping with chronic microangiopathic change  . HIV infection [facility]) Nov 2012   Followed by [name]  . Hyperlipidemia   . Hypertension   . Pulmonary embolism [facility])    Past Surgical History:  Procedure Laterality Date  . BACK SURGERY     1988  . BOWEL RESECTION    . CARDIAC SURGERY    . CERVICAL SPINE SURGERY     " rods in my neck "  . CORONARY ARTERY BYPASS GRAFT    . CORONARY STENT PLACEMENT    . NM MYOCAR PERF WALL  MOTION  12/27/2011   normal  . SPINE SURGERY     Social History   Socioeconomic History  . Marital status: Widowed    Spouse name: Not on file  . Number of children: 2  . Years of education: 74  . Highest education level: 12th grade  Occupational History    Employer: UNEMPLOYED    Comment: 04/2016  Social Needs  . Financial resource strain: Somewhat hard  . Food insecurity    Worry: Sometimes true    Inability: Sometimes true  . Transportation needs    Medical: Yes    Non-medical: Yes  Tobacco Use  . Smoking status: Never Smoker  . Smokeless tobacco: Never Used  Substance and Sexual Activity  . Alcohol use: No    Alcohol/week: 4.0 standard drinks    Types: 2 Cans of beer, 2 Shots of liquor per week  . Drug use: Yes    Types: "Crack" cocaine, Cocaine  . Sexual activity: Yes    Comment: accepted condoms  Lifestyle  . Physical activity    Days per week: 0 days    Minutes per session: 0 min  . Stress: Very much  Relationships  . Social Herbalist on phone: Once a week    Gets together: Never    Attends religious service:  Never    Active member of club or organization: No    Attends meetings of clubs or organizations: Never    Relationship status: Widowed  Other Topics Concern  . Not on file  Social History Narrative   Currently staying with a friend in [place].  Was staying @ local motel until a few days ago - left b/c of bed bugs.   Family History  Problem Relation Age of Onset  . Diabetes Mother   . Hypertension Mother   . Hyperlipidemia Mother   . Diabetes Father   . Cancer Father   . Hypertension Father   . Diabetes Brother   . Heart disease Brother   . Diabetes Sister   . Colon cancer Neg Hx    - negative except otherwise stated in the family history section No Known Allergies Prior to Admission medications   Medication Sig Start Date End Date Taking? Authorizing Provider  acetaminophen (TYLENOL) 325 MG tablet Take 325 mg by mouth every 6  (six) hours as needed for mild pain.   Yes [provider]  albuterol (VENTOLIN HFA) 108 (90 Base) MCG/ACT inhaler Inhale 1-2 puffs into the lungs every 6 (six) hours as needed for wheezing or shortness of breath. 06/19/19  Yes Merlyn Lot E, NP  allopurinol (ZYLOPRIM) 300 MG tablet Take 1 tablet (300 mg total) by mouth daily. For gout 06/20/19  Yes Mallie Darting, NP  aspirin 81 MG chewable tablet Chew 1 tablet (81 mg total) by mouth daily. Heart health 06/20/19  Yes Merlyn Lot E, NP  colchicine 0.6 MG tablet Take 0.6 mg by mouth as needed.  12/31/17  Yes [provider]  diltiazem (CARDIZEM CD) 120 MG 24 hr capsule Take 1 capsule (120 mg total) by mouth daily. For blood pressure 06/20/19  Yes Mallie Darting, NP  emtricitabine-rilpivir-tenofovir AF (ODEFSEY) 200-25-25 MG TABS tablet Take 1 tablet by mouth daily with breakfast. For HIV infection 06/20/19  Yes Merlyn Lot E, NP  famotidine (PEPCID) 20 MG tablet Take 1 tablet (20 mg total) by mouth 2 (two) times daily. For heart burn 06/19/19  Yes Merlyn Lot E, NP  ferrous sulfate 325 (65 FE) MG tablet Take 1 tablet (325 mg total) by mouth 2 (two) times daily with a meal. For low iron 06/19/19  Yes Merlyn Lot E, NP  fluticasone (FLOVENT HFA) 44 MCG/ACT inhaler Inhale 1 puff into the lungs 2 (two) times daily. For shortness of breath 06/19/19  Yes Merlyn Lot E, NP  folic acid (FOLVITE) 1 MG tablet Take 1 tablet (1 mg total) by mouth daily. For folate replacement 06/20/19  Yes Merlyn Lot E, NP  gabapentin (NEURONTIN) 400 MG capsule Take 1 capsule (400 mg total) by mouth 2 (two) times daily. For agitation/neuropathy 06/19/19  Yes Merlyn Lot E, NP  HUMALOG 100 UNIT/ML injection Inject 4-6 Units into the skin daily. 07/08/19  Yes [provider]  hydrOXYzine (ATARAX/VISTARIL) 25 MG tablet Take 1 tablet (25 mg total) by mouth 3 (three) times daily as needed for anxiety. 06/19/19  Yes Merlyn Lot E, NP  insulin glargine  (LANTUS) 100 UNIT/ML injection Inject 0.2 mLs (20 Units total) into the skin at bedtime. For high blood sugar 06/19/19  Yes Merlyn Lot E, NP  metFORMIN (GLUCOPHAGE) 1000 MG tablet Take 1,000 mg by mouth daily. 06/28/19  Yes [provider]  nitroGLYCERIN (NITROSTAT) 0.4 MG SL tablet Place 1 tablet (0.4 mg total) under the tongue every 5 (five) minutes as needed  for chest pain. 06/19/19  Yes Merlyn Lot E, NP  polyethylene glycol (MIRALAX / GLYCOLAX) 17 g packet Take 17 g by mouth daily as needed for mild constipation. 06/19/19  Yes Merlyn Lot E, NP  risperiDONE (RISPERDAL) 0.25 MG tablet Take 0.25 mg by mouth 2 (two) times daily. 01/23/19  Yes [provider]  sertraline (ZOLOFT) 50 MG tablet Take 1 tablet (50 mg total) by mouth daily. For depression 06/19/19  Yes Merlyn Lot E, NP  thiamine 100 MG tablet Take 1 tablet (100 mg total) by mouth daily. For thiamine deficiency 06/20/19  Yes Merlyn Lot E, NP  traZODone (DESYREL) 100 MG tablet Take 1 tablet (100 mg total) by mouth at bedtime as needed for sleep. 06/19/19  Yes Mallie Darting, NP   Dg Chest 2 View  Result Date: 07/16/2019 CLINICAL DATA:  Chest pain EXAM: CHEST - 2 VIEW COMPARISON:  Chest radiograph dated 04/11/2019 FINDINGS: The heart size and mediastinal contours are within normal limits. Median sternotomy wires, vascular stents, and a left atrial appendage clip are redemonstrated. There is elevation of the right hemidiaphragm. Both lungs are clear. Degenerative changes are seen in the spine. IMPRESSION: No acute cardiopulmonary findings. Electronically Signed   By: Zerita Boers M.D.   On: 07/16/2019 12:13   Nm Pulmonary Perfusion  Result Date: 07/17/2019 CLINICAL DATA:  Chest pain. EXAM: NUCLEAR MEDICINE PERFUSION LUNG SCAN TECHNIQUE: Perfusion images were obtained in multiple projections after intravenous injection of radiopharmaceutical. Ventilation scans intentionally deferred if perfusion scan and chest x-ray  adequate for interpretation during COVID 19 epidemic. RADIOPHARMACEUTICALS:  1.51 mCi Tc-72m MAA IV COMPARISON:  Radiograph of July 16, 2019. FINDINGS: No definite defect is noted on the perfusion study. Ventilation study is not necessary. IMPRESSION: No definite perfusion defect noted. No definite evidence of pulmonary embolus. Electronically Signed   By: Marijo Conception M.D.   On: 07/17/2019 12:43   Mr Foot Right Wo Contrast  Result Date: 07/17/2019 CLINICAL DATA:  Diabetic patient with right second toe pain and swelling. EXAM: MRI OF THE RIGHT FOREFOOT WITHOUT CONTRAST TECHNIQUE: Multiplanar, multisequence MR imaging of the right forefoot was performed. No intravenous contrast was administered. COMPARISON:  Plain films right foot 07/16/2019 FINDINGS: Bones/Joint/Cartilage There is marrow edema throughout the distal phalanx of the second toe consistent with osteomyelitis. No other evidence of osteomyelitis is identified. No fracture, stress change or worrisome lesion. No joint effusion. Ligaments Intact. Muscles and Tendons No intramuscular fluid collection is seen. The extensor tendon of the second toe appears torn from the distal phalanx without retraction. Soft tissues A fluid collection in the distal soft tissues of the second toe surrounding the distal phalanx measures approximately 1.2 cm AP x 2.4 cm transverse x 2.1 cm craniocaudal. There is some subcutaneous edema over the dorsum of the foot. IMPRESSION: Findings consistent with osteomyelitis throughout the distal phalanx of the second toe. Fluid collection surrounding the distal phalanx is consistent with an abscess or blister. The extensor tendon of the second toe appears torn from the distal phalanx without retraction. Electronically Signed   By: Inge Rise M.D.   On: 07/17/2019 09:40   Dg Foot Complete Right  Result Date: 07/16/2019 CLINICAL DATA:  Infection of the second toe. Swelling. EXAM: RIGHT FOOT COMPLETE - 3+ VIEW COMPARISON:   None. FINDINGS: There is swelling of the soft tissues of the distal second toe. On the lateral view the plantar aspect of the distal phalanx the second toe appears to be eroded away.  This is not appreciable on the AP and oblique views. Old healed fractures of the fourth and fifth metatarsal shafts. Slight arthritic changes of the first MTP joint. IMPRESSION: 1. Soft tissue swelling of the distal second toe. 2. The appearance of the plantar aspect of the distal phalanx of the second toe is suspicious for osteomyelitis. 3. Arthritic changes of the first MTP joint. Electronically Signed   By: Lorriane Shire M.D.   On: 07/16/2019 16:42   Vas [place] Lower Extremity Venous (dvt)  Result Date: 07/17/2019  Lower Venous Study Indications: Pulmonary embolism.  Comparison Study: Prior study from 05/04/18 is available for comparison Performing Technologist: Sharion Dove RVS  Examination Guidelines: A complete evaluation includes B-mode imaging, spectral Doppler, color Doppler, and power Doppler as needed of all accessible portions of each vessel. Bilateral testing is considered an integral part of a complete examination. Limited examinations for reoccurring indications may be performed as noted.  +---------+---------------+---------+-----------+----------+--------------+ RIGHT    CompressibilityPhasicitySpontaneityPropertiesThrombus Aging +---------+---------------+---------+-----------+----------+--------------+ CFV      Full           Yes      Yes                                 +---------+---------------+---------+-----------+----------+--------------+ SFJ      Full                                                        +---------+---------------+---------+-----------+----------+--------------+ FV Prox  Full                                                        +---------+---------------+---------+-----------+----------+--------------+ FV Mid   Full                                                         +---------+---------------+---------+-----------+----------+--------------+ FV DistalFull                                                        +---------+---------------+---------+-----------+----------+--------------+ PFV      Full                                                        +---------+---------------+---------+-----------+----------+--------------+ POP      Full           Yes      Yes                                 +---------+---------------+---------+-----------+----------+--------------+ PTV      Full                                                        +---------+---------------+---------+-----------+----------+--------------+  PERO     Full                                                        +---------+---------------+---------+-----------+----------+--------------+   +---------+---------------+---------+-----------+----------+--------------+ LEFT     CompressibilityPhasicitySpontaneityPropertiesThrombus Aging +---------+---------------+---------+-----------+----------+--------------+ CFV      Full           Yes      Yes                                 +---------+---------------+---------+-----------+----------+--------------+ SFJ      Full                                                        +---------+---------------+---------+-----------+----------+--------------+ FV Prox  Full                                                        +---------+---------------+---------+-----------+----------+--------------+ FV Mid   Full                                                        +---------+---------------+---------+-----------+----------+--------------+ FV DistalFull                                                        +---------+---------------+---------+-----------+----------+--------------+ PFV      Full                                                         +---------+---------------+---------+-----------+----------+--------------+ POP      Full           Yes      Yes                                 +---------+---------------+---------+-----------+----------+--------------+ PTV      Full                                                        +---------+---------------+---------+-----------+----------+--------------+ PERO     Full                                                        +---------+---------------+---------+-----------+----------+--------------+  Summary: Right: Findings appear essentially unchanged compared to previous examination. There is no evidence of deep vein thrombosis in the lower extremity. Left: Findings appear essentially unchanged compared to previous examination. There is no evidence of deep vein thrombosis in the lower extremity.  *See table(s) above for measurements and observations.    Preliminary    - pertinent xrays, CT, MRI studies were reviewed and independently interpreted  Positive ROS: All other systems have been reviewed and were otherwise negative with the exception of those mentioned in the HPI and as above.  Physical Exam: General: Alert, no acute distress Psychiatric: Patient is competent for consent with normal mood and affect Lymphatic: No axillary or cervical lymphadenopathy Cardiovascular: No pedal edema Respiratory: No cyanosis, no use of accessory musculature GI: No organomegaly, abdomen is soft and non-tender    Images:  @ENCIMAGES @  Labs:  Lab Results  Component Value Date   HGBA1C 9.7 (H) 07/17/2019   HGBA1C 10.9 (H) 06/16/2019   HGBA1C 13.2 (H) 04/15/2019   ESRSEDRATE 48 (H) 07/17/2019   ESRSEDRATE 127 (H) 02/25/2018   CRP 3.9 (H) 07/17/2019   CRP 0.8 04/19/2019   CRP 0.9 04/18/2019   LABURIC 9.7 (H) 07/17/2019   REPTSTATUS 03/24/2019 FINAL 03/19/2019   CULT  03/19/2019    NO GROWTH 5 DAYS Performed at [facility], [place] [address] [place], [place]  [address]     Lab Results  Component Value Date   ALBUMIN 3.7 06/17/2019   ALBUMIN 3.9 06/11/2019   ALBUMIN 3.5 04/23/2019   PREALBUMIN 15.8 (L) 07/17/2019   LABURIC 9.7 (H) 07/17/2019    Neurologic: Patient does not have protective sensation bilateral lower extremities.   MUSCULOSKELETAL:   Skin: Examination patient has swelling and ulceration to the right second toe.  MRI scan shows osteomyelitis of the second toe.  Patient does have peripheral vascular disease and has a faintly palpable dorsalis pedis and posterior tibial pulse.  Review of the labs patient has had chronic uncontrolled type 2 diabetes with a hemoglobin A1c ranging from 13-10.  Patient does have gout with a uric acid of 9.7. Assessment: Assessment: Uncontrolled type 2 diabetes with gout with abscess and osteomyelitis of the right second toe.  Plan: Plan: Will follow the patient and see how he does with his treatment for gout and chest pain.  Patient will need an amputation of the second toe and this could be performed on Wednesday if patient is stable.  Thank you for the consult and the opportunity to see Mr. Lyndel Safe, MD [facility] [phone] 3:48 PM

## 2019-07-17 NOTE — Progress Notes (Signed)
VASCULAR LAB PRELIMINARY  PRELIMINARY  PRELIMINARY  PRELIMINARY  Bilateral lower extremity venous duplex completed.    Preliminary report:  See CV proc for preliminary results.   Trinnity Breunig, RVT 07/17/2019, 9:59 AM

## 2019-07-17 NOTE — Progress Notes (Signed)
Progress Note    Michael [name]  DDU:[id number] DOB: 1951/08/28  DOA: 07/16/2019 PCP: Michael Demark, PA-C    Brief Narrative:     Medical records reviewed and are as summarized below:  Michael [name] is an 68 y.o. male with medical history significant of hypertension, hyperlipidemia, diabetes mellitus, asthma, GERD, gout, depression, cocaine abuse, PE not on anticoagulants, HIV, CAD, s/po of CABG, s/p of stent placement, atrial fibrillation not on anticoagulants, CKD stage III, iron deficiency anemia, who presents with chest and toe pain.  Recent cocaine use.  Assessment/Plan:   Principal Problem:   Chest pain Active Problems:   HIV (human immunodeficiency virus infection) ([place])   Hyperlipidemia with target LDL less than 100   3-vessel CAD   Severe recurrent major depressive disorder with psychotic features ([place])   Cocaine use disorder, severe, dependence ([place])   Gout   Essential hypertension   Type II diabetes mellitus with renal manifestations ([name])   GERD (gastroesophageal reflux disease)   S/P CABG (coronary artery bypass graft)   Chronic kidney disease, stage III (moderate)   Paroxysmal A-fib ([name])   Toe pain, right-second   Osteomyelitis of the right-second toe:   -X-ray showed that the appearance of the plantar aspect of the distal phalanx of the second toe is suspicious for osteomyelitis.   -blood culture x2 -MRI: Findings consistent with osteomyelitis throughout the distal phalanx of the second toe. Fluid collection surrounding the distal phalanx is consistent with an abscess or blister. -will order focused lower ext wound order set -ortho consult- Dr. Marlou [name]  Chest pain and hx of 3-vessel CAD:  -CE reassuring -suspect cocaine abuse contributing- do not see any need for cardiology consult at this point  HIV (human immunodeficiency virus infection) ([place]): CD4= 270 on 12/11/9 -continue home HIV meds -follow with Dr. Johnnye [name]  Hyperlipidemia  with target LDL less than 100: not taking meds now. -LDL 110  Severe recurrent major depressive disorder with psychotic features ([place]): no SI or HI. -recent stay at [facility]  Cocaine use disorder, severe, dependence ([place]): -needs cessation  Gout: -allopurinol and Colchicine  Essential hypertension: -prn oral hydralazine Continue Cardizem  -avoid BB in light of cocaine use  Type II diabetes mellitus with renal manifestations ([place]):  -poorly controled. Patient is taking metformin, Humalog and Lantus at home -SSI  GERD (gastroesophageal reflux disease): -pepcid  Chronic kidney disease, stage III (moderate): Close to baseline.  Baseline creatinine 1.4-1.8.   -Follow-up with BMP  Paroxysmal A-fib ([place]): CHA2DS2-VASc Score is 4 , needs oral anticoagulation, but pt is not on AC, not sure why he is not on [facility]-- ? Non-compliance-- chart review not helpful in determining why -continue cardizem  Anemia ?ckd -trend    Family Communication/Anticipated D/C date and plan/Code Status   DVT prophylaxis: heparin Code Status: Full Code.  Family Communication:  Disposition Plan:    Medical Consultants:    ortho  Subjective:   Toe hurts  Objective:    Vitals:   07/17/19 0108 07/17/19 0209 07/17/19 0308 07/17/19 0408  BP: 112/66 (!) 131/59 114/68 126/72  Pulse: 75 73 78 80  Resp: 16 13 12 12   Temp:    97.7 F (36.5 C)  TempSrc:    Oral  SpO2: 100% 100% 99% 100%  Weight:    73 kg  Height:        Intake/Output Summary (Last 24 hours) at 07/17/2019 1018 Last data filed at 07/17/2019 0700 Gross per 24 hour  Intake  945.05 ml  Output 400 ml  Net 545.05 ml   Filed Weights   07/16/19 2105 07/17/19 0408  Weight: 73 kg 73 kg    Exam: In bed, mask on rrr Right foot 2nd toe, discolored, swollen, ? Nail avulsion Foot warm and dry Awake and alert- minimally interactive  Data Reviewed:   I have personally reviewed following labs and imaging  studies:  Labs: Labs show the following:   Basic Metabolic Panel: Recent Labs  Lab 07/16/19 1452  NA 139  K 3.9  CL 104  CO2 23  GLUCOSE 339*  BUN 34*  CREATININE 1.70*  CALCIUM 9.4   GFR Estimated Creatinine Clearance: 39.7 mL/min (A) (by C-G formula based on SCr of 1.7 mg/dL (H)). Liver Function Tests: No results for input(s): AST, ALT, ALKPHOS, BILITOT, PROT, ALBUMIN in the last 168 hours. No results for input(s): LIPASE, AMYLASE in the last 168 hours. No results for input(s): AMMONIA in the last 168 hours. Coagulation profile No results for input(s): INR, PROTIME in the last 168 hours.  CBC: Recent Labs  Lab 07/16/19 1130 07/17/19 0043  WBC 6.4 4.5  HGB 10.4* 8.8*  HCT 31.4* 26.3*  MCV 79.7* 77.8*  PLT 167 142*   Cardiac Enzymes: No results for input(s): CKTOTAL, CKMB, CKMBINDEX, TROPONINI in the last 168 hours. BNP (last 3 results) No results for input(s): PROBNP in the last 8760 hours. CBG: Recent Labs  Lab 07/16/19 2108 07/17/19 0737  GLUCAP 212* 147*   D-Dimer: Recent Labs    07/16/19 1730  DDIMER 1.26*   Hgb A1c: Recent Labs    07/17/19 0043  HGBA1C 9.7*   Lipid Profile: Recent Labs    07/17/19 0043  CHOL 178  HDL 54  LDLCALC 110*  TRIG 68  CHOLHDL 3.3   Thyroid function studies: No results for input(s): TSH, T4TOTAL, T3FREE, THYROIDAB in the last 72 hours.  Invalid input(s): FREET3 Anemia work up: No results for input(s): VITAMINB12, FOLATE, FERRITIN, TIBC, IRON, RETICCTPCT in the last 72 hours. Sepsis Labs: Recent Labs  Lab 07/16/19 1130 07/17/19 0043  WBC 6.4 4.5    Microbiology Recent Results (from the past 240 hour(s))  SARS CORONAVIRUS 2 (TAT 6-24 HRS) Nasopharyngeal Nasopharyngeal Swab     Status: None   Collection Time: 07/16/19  4:35 PM   Specimen: Nasopharyngeal Swab  Result Value Ref Range Status   SARS Coronavirus 2 NEGATIVE NEGATIVE Final    Comment: (NOTE) SARS-CoV-2 target nucleic acids are NOT  DETECTED. The SARS-CoV-2 RNA is generally detectable in upper and lower respiratory specimens during the acute phase of infection. Negative results do not preclude SARS-CoV-2 infection, do not rule out co-infections with other pathogens, and should not be used as the sole basis for treatment or other patient management decisions. Negative results must be combined with clinical observations, patient history, and epidemiological information. The expected result is Negative. Fact Sheet for Patients: SugarRoll.be Fact Sheet for Healthcare Providers: https://www.woods-mathews.com/ This test is not yet approved or cleared by the [place] FDA and  has been authorized for detection and/or diagnosis of SARS-CoV-2 by FDA under an Emergency Use Authorization (EUA). This EUA will remain  in effect (meaning this test can be used) for the duration of the COVID-19 declaration under Section 56 4(b)(1) of the Act, 21 U.S.C. section 360bbb-3(b)(1), unless the authorization is terminated or revoked sooner. Performed at [facility], [place] [address], [place], [place] [address]     Procedures and diagnostic studies:  Dg  Chest 2 View  Result Date: 07/16/2019 CLINICAL DATA:  Chest pain EXAM: CHEST - 2 VIEW COMPARISON:  Chest radiograph dated 04/11/2019 FINDINGS: The heart size and mediastinal contours are within normal limits. Median sternotomy wires, vascular stents, and a left atrial appendage clip are redemonstrated. There is elevation of the right hemidiaphragm. Both lungs are clear. Degenerative changes are seen in the spine. IMPRESSION: No acute cardiopulmonary findings. Electronically Signed   By: Zerita Boers M.D.   On: 07/16/2019 12:13   Mr Foot Right Wo Contrast  Result Date: 07/17/2019 CLINICAL DATA:  Diabetic patient with right second toe pain and swelling. EXAM: MRI OF THE RIGHT FOREFOOT WITHOUT CONTRAST TECHNIQUE: Multiplanar, multisequence  MR imaging of the right forefoot was performed. No intravenous contrast was administered. COMPARISON:  Plain films right foot 07/16/2019 FINDINGS: Bones/Joint/Cartilage There is marrow edema throughout the distal phalanx of the second toe consistent with osteomyelitis. No other evidence of osteomyelitis is identified. No fracture, stress change or worrisome lesion. No joint effusion. Ligaments Intact. Muscles and Tendons No intramuscular fluid collection is seen. The extensor tendon of the second toe appears torn from the distal phalanx without retraction. Soft tissues A fluid collection in the distal soft tissues of the second toe surrounding the distal phalanx measures approximately 1.2 cm AP x 2.4 cm transverse x 2.1 cm craniocaudal. There is some subcutaneous edema over the dorsum of the foot. IMPRESSION: Findings consistent with osteomyelitis throughout the distal phalanx of the second toe. Fluid collection surrounding the distal phalanx is consistent with an abscess or blister. The extensor tendon of the second toe appears torn from the distal phalanx without retraction. Electronically Signed   By: Inge Rise M.D.   On: 07/17/2019 09:40   Dg Foot Complete Right  Result Date: 07/16/2019 CLINICAL DATA:  Infection of the second toe. Swelling. EXAM: RIGHT FOOT COMPLETE - 3+ VIEW COMPARISON:  None. FINDINGS: There is swelling of the soft tissues of the distal second toe. On the lateral view the plantar aspect of the distal phalanx the second toe appears to be eroded away. This is not appreciable on the AP and oblique views. Old healed fractures of the fourth and fifth metatarsal shafts. Slight arthritic changes of the first MTP joint. IMPRESSION: 1. Soft tissue swelling of the distal second toe. 2. The appearance of the plantar aspect of the distal phalanx of the second toe is suspicious for osteomyelitis. 3. Arthritic changes of the first MTP joint. Electronically Signed   By: Lorriane Shire M.D.   On:  07/16/2019 16:42   Vas [place] Lower Extremity Venous (dvt)  Result Date: 07/17/2019  Lower Venous Study Indications: Pulmonary embolism.  Comparison Study: Prior study from 05/04/18 is available for comparison Performing Technologist: Sharion Dove RVS  Examination Guidelines: A complete evaluation includes B-mode imaging, spectral Doppler, color Doppler, and power Doppler as needed of all accessible portions of each vessel. Bilateral testing is considered an integral part of a complete examination. Limited examinations for reoccurring indications may be performed as noted.  +---------+---------------+---------+-----------+----------+--------------+ RIGHT    CompressibilityPhasicitySpontaneityPropertiesThrombus Aging +---------+---------------+---------+-----------+----------+--------------+ CFV      Full           Yes      Yes                                 +---------+---------------+---------+-----------+----------+--------------+ SFJ      Full                                                        +---------+---------------+---------+-----------+----------+--------------+  FV Prox  Full                                                        +---------+---------------+---------+-----------+----------+--------------+ FV Mid   Full                                                        +---------+---------------+---------+-----------+----------+--------------+ FV DistalFull                                                        +---------+---------------+---------+-----------+----------+--------------+ PFV      Full                                                        +---------+---------------+---------+-----------+----------+--------------+ POP      Full           Yes      Yes                                 +---------+---------------+---------+-----------+----------+--------------+ PTV      Full                                                         +---------+---------------+---------+-----------+----------+--------------+ PERO     Full                                                        +---------+---------------+---------+-----------+----------+--------------+   +---------+---------------+---------+-----------+----------+--------------+ LEFT     CompressibilityPhasicitySpontaneityPropertiesThrombus Aging +---------+---------------+---------+-----------+----------+--------------+ CFV      Full           Yes      Yes                                 +---------+---------------+---------+-----------+----------+--------------+ SFJ      Full                                                        +---------+---------------+---------+-----------+----------+--------------+ FV Prox  Full                                                        +---------+---------------+---------+-----------+----------+--------------+  FV Mid   Full                                                        +---------+---------------+---------+-----------+----------+--------------+ FV DistalFull                                                        +---------+---------------+---------+-----------+----------+--------------+ PFV      Full                                                        +---------+---------------+---------+-----------+----------+--------------+ POP      Full           Yes      Yes                                 +---------+---------------+---------+-----------+----------+--------------+ PTV      Full                                                        +---------+---------------+---------+-----------+----------+--------------+ PERO     Full                                                        +---------+---------------+---------+-----------+----------+--------------+     Summary: Right: Findings appear essentially unchanged compared to previous examination. There is no evidence of deep  vein thrombosis in the lower extremity. Left: Findings appear essentially unchanged compared to previous examination. There is no evidence of deep vein thrombosis in the lower extremity.  *See table(s) above for measurements and observations.    Preliminary     Medications:   . allopurinol  300 mg Oral Daily  . aspirin  81 mg Oral Daily  . diltiazem  120 mg Oral Daily  . emtricitabine-rilpivir-tenofovir AF  1 tablet Oral Q breakfast  . famotidine  20 mg Oral BID  . ferrous sulfate  325 mg Oral BID WC  . folic acid  1 mg Oral Daily  . gabapentin  400 mg Oral BID  . insulin aspart  0-9 Units Subcutaneous TID WC  . insulin glargine  14 Units Subcutaneous QHS  . risperiDONE  0.25 mg Oral BID  . sertraline  50 mg Oral Daily  . sodium chloride flush  3 mL Intravenous Once  . thiamine  100 mg Oral Daily   Continuous Infusions: . cefTRIAXone (ROCEPHIN)  IV     And  . metronidazole    . heparin 1,200 Units/hr (07/17/19 0700)     LOS: 0 days   Geradine Girt  [facility]   How to contact the [facility]  Attending or Consulting provider [place] or covering provider during after hours [place], for this patient?  1. Check the care team in [facility] and look for a) attending/consulting TR[facility] provider listed and b) the [facility] team listed 2. Log into [email or web address] and use [place]'s universal password to access. If you do not have the password, please contact the hospital operator. 3. Locate the [facility] provider you are looking for under [facility] and page to a number that you can be directly reached. 4. If you still have difficulty reaching the provider, please page the [facility] (Director on Call) for the Hospitalists listed on amion for assistance.  07/17/2019, 10:18 AM

## 2019-07-17 NOTE — Progress Notes (Signed)
ANTICOAGULATION CONSULT NOTE - Initial Consult  Pharmacy Consult for Heparin Indication: atrial fibrillation and pulmonary embolus  No Known Allergies  Patient Measurements: Height: 5\' 8"  (172.7 cm) Weight: 160 lb 15 oz (73 kg) IBW/kg (Calculated) : 68.4 Heparin Dosing Weight: 75 kg  Vital Signs: Temp: 97.7 F (36.5 C) (10/10 0408) Temp Source: Oral (10/10 0408) BP: 126/72 (10/10 0408) Pulse Rate: 80 (10/10 0408)  Labs: Recent Labs    07/16/19 1130 07/16/19 1452 07/17/19 0043 07/17/19 0735  HGB 10.4*  --  8.8*  --   HCT 31.4*  --  26.3*  --   PLT 167  --  142*  --   HEPARINUNFRC  --   --  0.57 0.70  CREATININE  --  1.70*  --   --   TROPONINIHS 10 10 8   --     Estimated Creatinine Clearance: 39.7 mL/min (A) (by C-G formula based on SCr of 1.7 mg/dL (H)).   Medical History: Past Medical History:  Diagnosis Date  . A-fib ([place])   . Asthma    No PFTs, history of childhood asthma  . CAD (coronary artery disease)    a. 06/2013 STEMI/PCI ([facility]): LAD w/ thrombus (treated with BMS), mid 75%, D2 75%; LCX OM2 75%; RCA small, PDA 95%, PLV 95%;  b. 10/2013 Cath/PCI: ISR w/in LAD (Promus DES x 2), borderline OM2 lesion;  c. 01/2014 MV: Intermediate risk, medium-sized distal ant wall infarct w/ very small amt of peri-infarct ischemia. EF 60%.  . Cellulitis 04/2014   left facial  . Chondromalacia of medial femoral condyle    Left knee MRI 04/28/12: Chondromalacia of the medial femoral condyle with slight peripheral degeneration of the meniscocapsular junction of the medial meniscus; followed by sports medicine  . Collagen vascular disease ([place])   . Crack cocaine use    for 20+ years, has been enrolled in detox programs in the past  . Depression    with history of hospitalization for suicidal ideation  . Diabetes mellitus 2002   Diagnosed in 2002, started insulin in 2012  . Gout   . Gout 04/28/2012  . Headache(784.0)    CT head 08/2011: Periventricular and subcortical white matter  hypodensities are most in keeping with chronic microangiopathic change  . HIV infection [facility]) Nov 2012   Followed by [name]  . Hyperlipidemia   . Hypertension   . Pulmonary embolism [facility])    Assessment: Michael [name] is a 68 y/o male with PMH of A-fib (not on anticoagulation PTA), asthma, CAD, cellulitis, cocaine use, DM, gout, headache, HIV, HLD, HTN, and PE. He presents with right foot pain and chest pain. Troponins are WNL. Chest pain worsens with exertion/cocaine use. Chest pain not relieved with nitroglycerin. Pt had a CABG around a year ago for three-vessel CAD. Due to elevated D-dimer, there is concern for PE. Pharmacy has been consulted for heparin dosing.   CT chest on 10/9 showed no acute findings. LE doppler US planned today to r/o DVT.  Heparin level this morning is therapeutic but on the higher end of the range. Will decrease the heparin rate slightly.   CBC low. Dropped overnight Hgb 10.4>8.8 and plts 167>142. Most likely dilutional since the patient is receiving IV fluids. Will continue to monitor. No active bleeding or line issues per nursing.   Goal of Therapy:  Heparin level 0.3-0.7 units/ml Monitor platelets by anticoagulation protocol: Yes   Plan:  Decrease heparin infusion to 1150 units/hr Check confirmatory heparin level in 6 hours  Monitor daily heparin level and CBC Continue to monitor H&H and platelets daily Monitor for signs/symptoms of bleeding  Kennon Holter, PharmD PGY1 Ambulatory Care Pharmacy Resident [employer] Phone: [phone] 07/17/2019,8:35 AM

## 2019-07-17 NOTE — Progress Notes (Signed)
ANTICOAGULATION CONSULT NOTE  Pharmacy Consult for Heparin Indication: atrial fibrillation and pulmonary embolus  No Known Allergies  Patient Measurements: Height: 5\' 8"  (172.7 cm) Weight: 160 lb 15 oz (73 kg) IBW/kg (Calculated) : 68.4 Heparin Dosing Weight: 75 kg  Vital Signs: Temp: 98.1 F (36.7 C) (10/09 2105) Temp Source: Oral (10/09 2105) BP: 154/95 (10/09 2105) Pulse Rate: 63 (10/09 2105)  Labs: Recent Labs    07/16/19 1130 07/16/19 1452 07/17/19 0043  HGB 10.4*  --  8.8*  HCT 31.4*  --  26.3*  PLT 167  --  142*  HEPARINUNFRC  --   --  0.57  CREATININE  --  1.70*  --   TROPONINIHS 10 10  --     Estimated Creatinine Clearance: 39.7 mL/min (A) (by C-G formula based on SCr of 1.7 mg/dL (H)).   Assessment: 68 y.o. male admitted with chest pain, possible PE, for heparin  Goal of Therapy:  Heparin level 0.3-0.7 units/ml Monitor platelets by anticoagulation protocol: Yes   Plan:  Continue Heparin at current rate   Follow-up am labs.   Phillis Knack, PharmD, BCPS  07/17/2019,2:00 AM

## 2019-07-18 LAB — GLUCOSE, CAPILLARY
Glucose-Capillary: 115 mg/dL — ABNORMAL HIGH (ref 70–99)
Glucose-Capillary: 231 mg/dL — ABNORMAL HIGH (ref 70–99)
Glucose-Capillary: 172 mg/dL — ABNORMAL HIGH (ref 70–99)
Glucose-Capillary: 168 mg/dL — ABNORMAL HIGH (ref 70–99)

## 2019-07-18 LAB — CBC
HCT: 29.1 % — ABNORMAL LOW (ref 39.0–52.0)
Hemoglobin: 9.2 g/dL — ABNORMAL LOW (ref 13.0–17.0)
MCH: 25.4 pg — ABNORMAL LOW (ref 26.0–34.0)
MCHC: 31.6 g/dL (ref 30.0–36.0)
MCV: 80.4 fL (ref 80.0–100.0)
Platelets: 161 10*3/uL (ref 150–400)
RBC: 3.62 MIL/uL — ABNORMAL LOW (ref 4.22–5.81)
RDW: 14.1 % (ref 11.5–15.5)
WBC: 3.4 10*3/uL — ABNORMAL LOW (ref 4.0–10.5)
nRBC: 0 % (ref 0.0–0.2)

## 2019-07-18 LAB — HEPARIN LEVEL (UNFRACTIONATED): Heparin Unfractionated: 0.63 IU/mL (ref 0.30–0.70)

## 2019-07-18 LAB — BASIC METABOLIC PANEL
Anion gap: 10 (ref 5–15)
BUN: 18 mg/dL (ref 8–23)
CO2: 20 mmol/L — ABNORMAL LOW (ref 22–32)
Calcium: 8.5 mg/dL — ABNORMAL LOW (ref 8.9–10.3)
Chloride: 111 mmol/L (ref 98–111)
Creatinine, Ser: 1.42 mg/dL — ABNORMAL HIGH (ref 0.61–1.24)
GFR calc Af Amer: 58 mL/min — ABNORMAL LOW (ref >60)
GFR calc non Af Amer: 50 mL/min — ABNORMAL LOW (ref >60)
Glucose, Bld: 155 mg/dL — ABNORMAL HIGH (ref 70–99)
Potassium: 4 mmol/L (ref 3.5–5.1)
Sodium: 141 mmol/L (ref 135–145)

## 2019-07-18 IMAGING — CR DG CHEST 2V
2 series · 2 of 2 positions shown · non-contrast
Comparison: 05/06/2018 CXR

CLINICAL DATA: Cough with sweats

EXAM:
CHEST - 2 VIEW

[w chest pa]
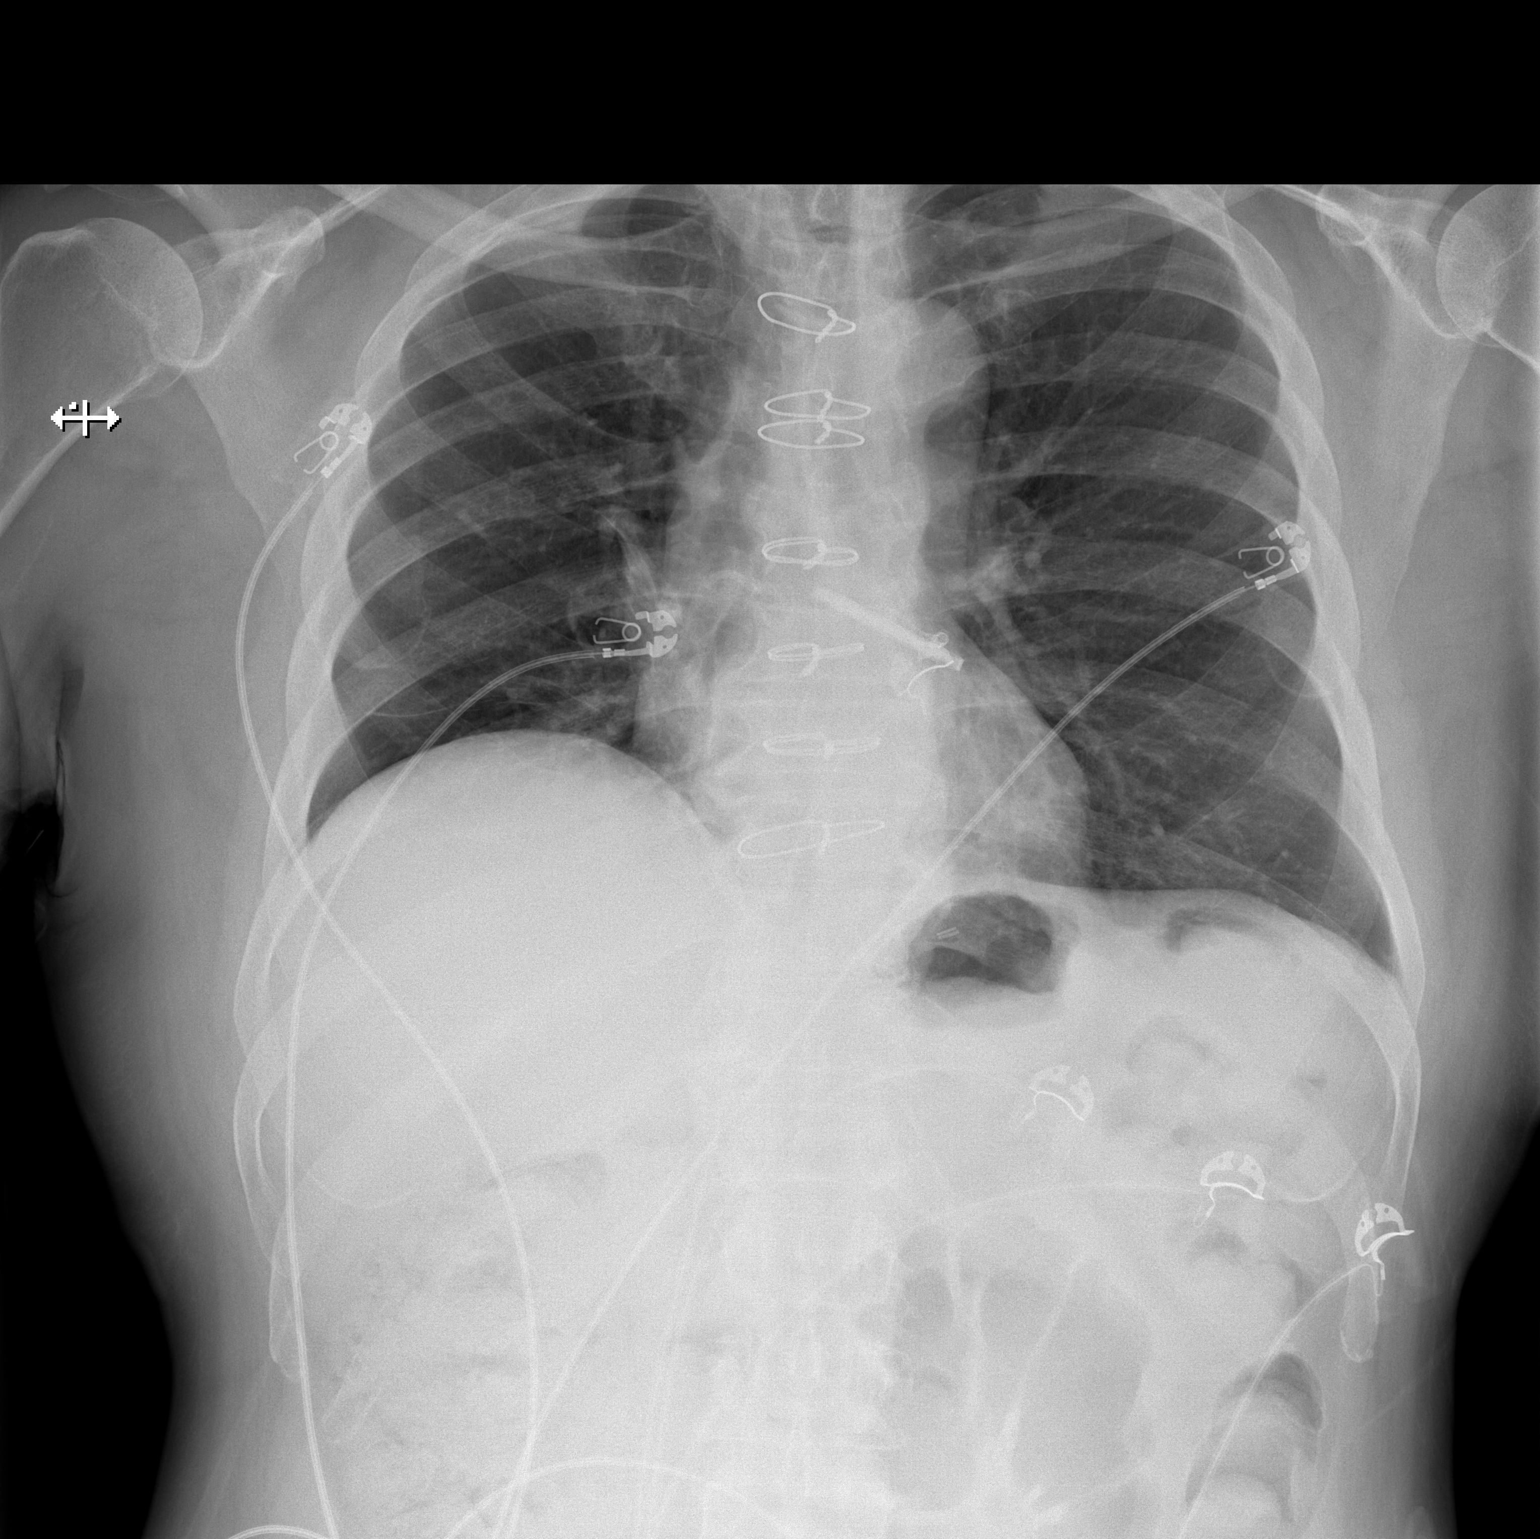

[w chest lat]
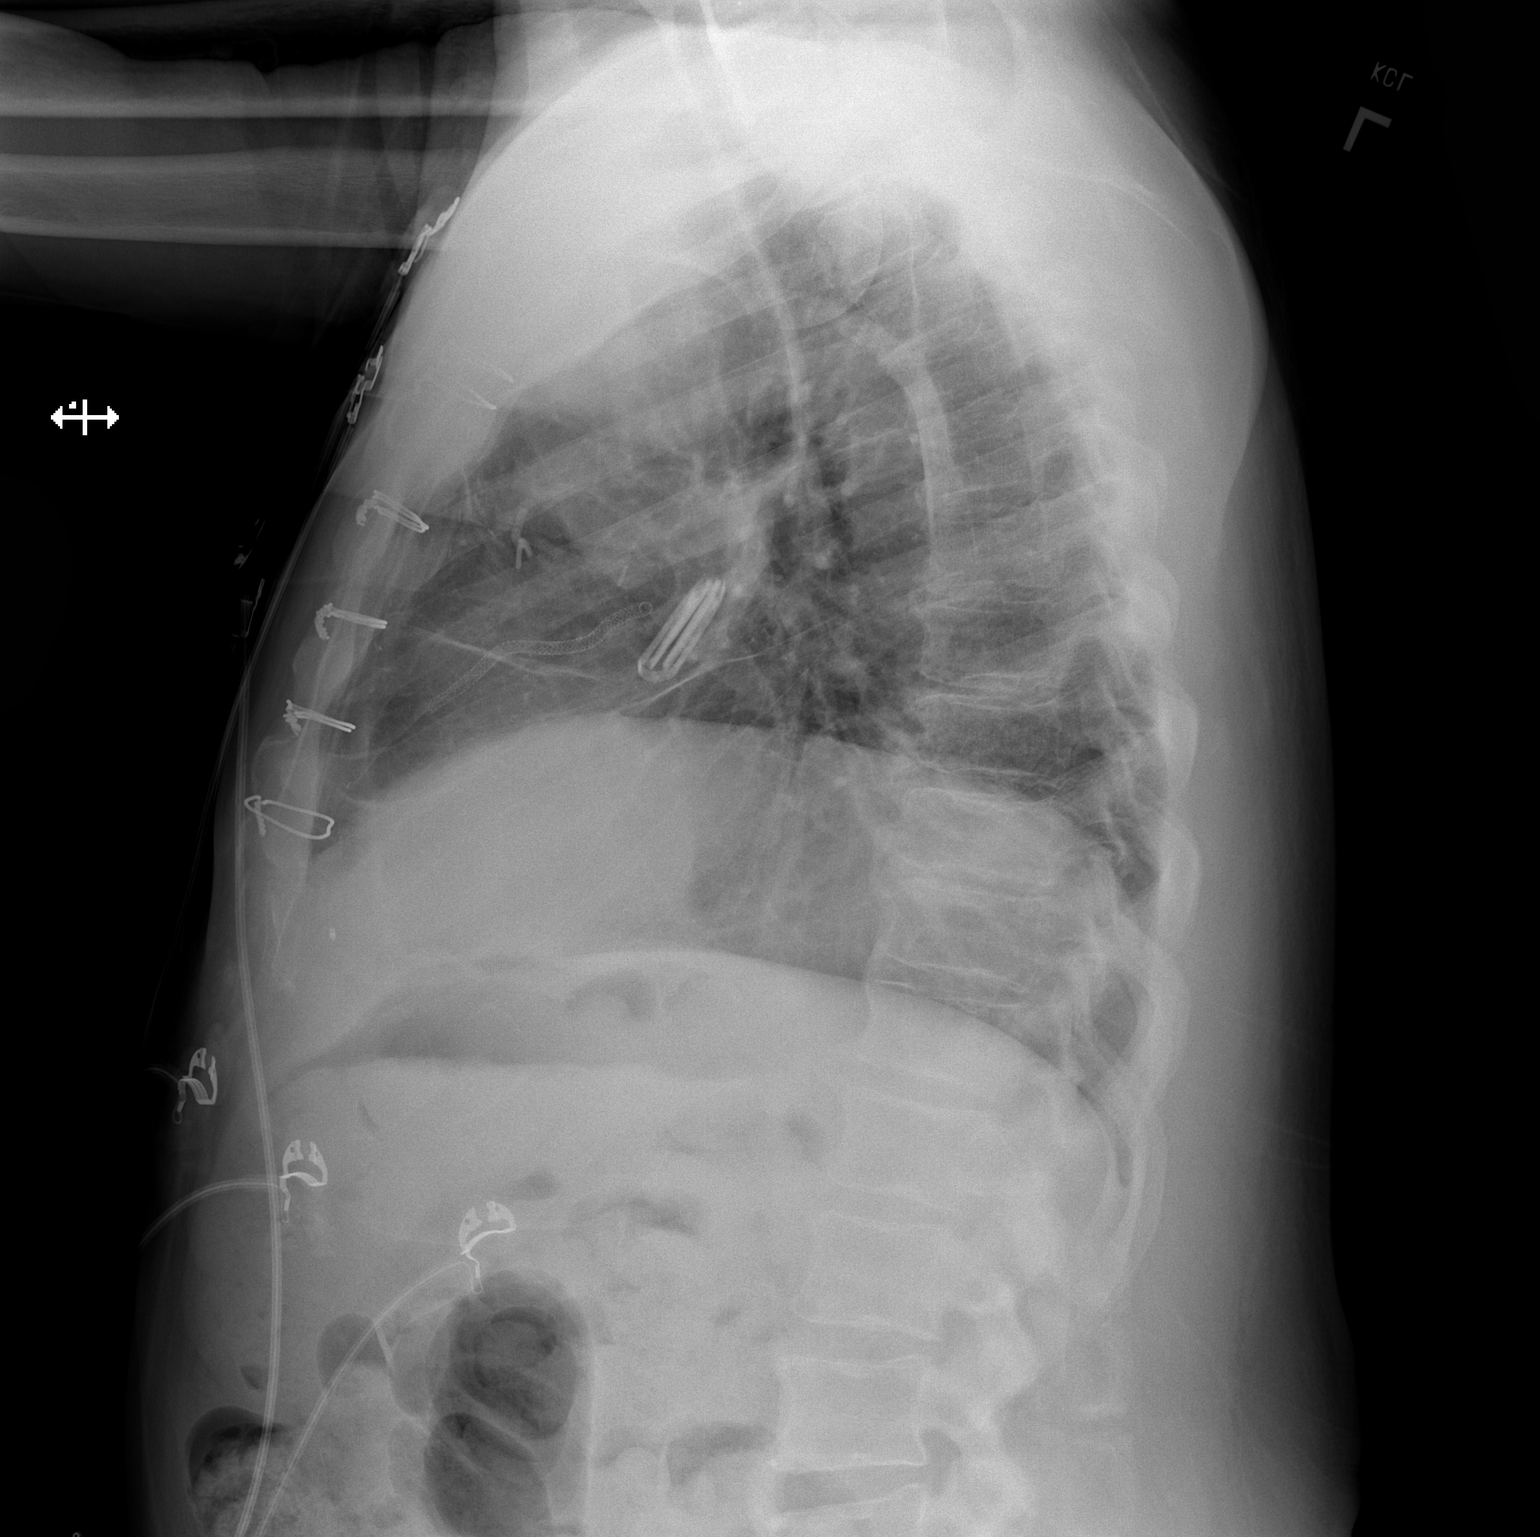

[2 of 2 positions shown; findings below may reference images not displayed]

FINDINGS: The heart size and mediastinal contours are within normal limits.
Coronary arterial stent projects over the right heart. Both lungs
are clear. ACDF hardware of the included lower cervical spine.
Median sternotomy sutures are in place with left atrial appendage
clipping. Elevated right hemidiaphragm is stable. The visualized
skeletal structures are unremarkable.
IMPRESSION: No active cardiopulmonary disease.

## 2019-07-18 MED ORDER — COLCHICINE 0.6 MG PO TABS
0.6000 mg | ORAL_TABLET | Freq: Every day | ORAL | Status: DC
  Administered 2019-07-18 – 2019-08-04 (×18): 0.6 mg via ORAL
  Filled 2019-07-18 (×18): qty 1

## 2019-07-18 MED ORDER — PROSIGHT PO TABS
1.0000 | ORAL_TABLET | Freq: Every day | ORAL | Status: DC
  Administered 2019-07-18 – 2019-08-04 (×18): 1 via ORAL
  Filled 2019-07-18 (×18): qty 1

## 2019-07-18 MED ORDER — HEPARIN SODIUM (PORCINE) 5000 UNIT/ML IJ SOLN
5000.0000 [IU] | Freq: Three times a day (TID) | INTRAMUSCULAR | Status: DC
  Administered 2019-07-18 – 2019-08-04 (×48): 5000 [IU] via SUBCUTANEOUS
  Filled 2019-07-18 (×49): qty 1

## 2019-07-18 NOTE — Consult Note (Signed)
[facility] Nurse wound consult note Reason for Consult:Right foot, second toe Wound type:Infectious  Patient has been seen by Dr [name] (Orthopedics) and his not from yesterday indicates that he will follow, see again on Monday and that patient may be having an amputation of the affected digit on Wednesday 10/14.  There is no role for WOC Nurse at this time.  [facility] nursing team will not follow, but will remain available to this patient, the nursing and medical teams.  Please re-consult if needed. Thanks, Maudie Flakes, MSN, RN, [place], Arther Abbott  Pager# [phone]

## 2019-07-18 NOTE — Progress Notes (Signed)
CSW acknowledges consult and alerted RN case manager about consult for access to medication at discharge.

## 2019-07-18 NOTE — Progress Notes (Signed)
Progress Note    Michael [name]  BTD:[id number] DOB: [birth year]  DOA: 07/16/2019 PCP: [name], PA-C    Brief Narrative:     Medical records reviewed and are as summarized below:  Michael [name] is an 68 y.o. male with medical history significant of hypertension, hyperlipidemia, diabetes mellitus, asthma, GERD, gout, depression, cocaine abuse, h/o PE not on anticoagulants any longer, HIV, CAD, s/po of CABG, s/p of stent placement, atrial fibrillation not on anticoagulants, CKD stage III, iron deficiency anemia, who presents with chest and toe pain.  Recent cocaine use.  Assessment/Plan:   Principal Problem:   Chest pain Active Problems:   HIV (human immunodeficiency virus infection) ([place])   Hyperlipidemia with target LDL less than 100   3-vessel CAD   Severe recurrent major depressive disorder with psychotic features ([place])   Cocaine use disorder, severe, dependence ([place])   Gout   Essential hypertension   Type II diabetes mellitus with renal manifestations ([name])   GERD (gastroesophageal reflux disease)   S/P CABG (coronary artery bypass graft)   Chronic kidney disease, stage III (moderate)   Paroxysmal A-fib ([name])   Toe pain, right-second   Osteomyelitis ([name])   Abscess or cellulitis of toe, right   Osteomyelitis of the right-second toe:   -X-ray showed that the appearance of the plantar aspect of the distal phalanx of the second toe is suspicious for osteomyelitis.   -MRI: Findings consistent with osteomyelitis throughout the distal phalanx of the second toe. Fluid collection surrounding the distal phalanx is consistent with an abscess or blister. --blood culture x2 -will order focused lower ext wound order set- ABI pending, elevated sed and c-reactive protein -IV abx started -ortho consult- Dr. Marlou Sa  Chest pain and hx of 3-vessel CAD:  -CE reassuring -suspect cocaine abuse contributing- do not see any need for cardiology consult at this  point -V/Q negative for PE  HIV (human immunodeficiency virus infection) ([place]): CD4= 270 on 12/11/9 -continue home HIV meds -follows with [name] -? Of compliance with medications  Hyperlipidemia with target LDL less than 100: not taking meds now. -LDL 110  Severe recurrent major depressive disorder with psychotic features ([place]): no SI or HI. -[facility]  Cocaine use disorder, severe, dependence ([place]): -needs cessation  Gout: -allopurinol and Colchicine  Essential hypertension: -prn oral hydralazine Continue Cardizem  -avoid BB in light of cocaine use  Type II diabetes mellitus with renal manifestations ([place]):  -poorly controled. Patient is taking metformin, Humalog and Lantus at home -SSI -may need long acting started pending improvement  GERD (gastroesophageal reflux disease): -pepcid  Chronic kidney disease, stage III (moderate): Close to baseline.  Baseline creatinine 1.4-1.8.   -daily BMP  Paroxysmal A-fib ([place]): CHA2DS2-VASc Score is 4: pt is not on AC, not sure why he is not on [facility]-- ? Non-compliance-- chart review not helpful in determining why -continue cardizem -will discuss with patient  Anemia ?ckd -trend- h/h stable    Family Communication/Anticipated D/C date and plan/Code Status   DVT prophylaxis: heparin SQ Code Status: Full Code.  Family Communication:  Disposition Plan: pending improvement or amputation of toe   Medical Consultants:    ortho  Subjective:   Foot feels tight He is not sure why he is not on a blood thinner for his a fib  Objective:    Vitals:   07/17/19 2044 07/18/19 0044 07/18/19 0443 07/18/19 0451  BP: 128/71 112/71 121/77   Pulse: 76 80 74  Resp: 16 (!) 9 (!) 22   Temp: 98.5 F (36.9 C)   98.1 F (36.7 C)  TempSrc: Oral   Oral  SpO2: 100% 98% 99%   Weight:    74.8 kg  Height:        Intake/Output Summary (Last 24 hours) at 07/18/2019 0807 Last data filed at 07/18/2019  0600 Gross per 24 hour  Intake 797.62 ml  Output 1450 ml  Net -652.38 ml   Filed Weights   07/16/19 2105 07/17/19 0408 07/18/19 0451  Weight: 73 kg 73 kg 74.8 kg    Exam: In bed, NAD Soft-spoken irr No increased work of breathing Right 2nd toe with anterior pus pocket- discoloration of toe Fungal appearing nails  Data Reviewed:   I have personally reviewed following labs and imaging studies:  Labs: Labs show the following:   Basic Metabolic Panel: Recent Labs  Lab 07/16/19 1452 07/18/19 0510  NA 139 141  K 3.9 4.0  CL 104 111  CO2 23 20*  GLUCOSE 339* 155*  BUN 34* 18  CREATININE 1.70* 1.42*  CALCIUM 9.4 8.5*   GFR Estimated Creatinine Clearance: 47.5 mL/min (A) (by C-G formula based on SCr of 1.42 mg/dL (H)). Liver Function Tests: No results for input(s): AST, ALT, ALKPHOS, BILITOT, PROT, ALBUMIN in the last 168 hours. No results for input(s): LIPASE, AMYLASE in the last 168 hours. No results for input(s): AMMONIA in the last 168 hours. Coagulation profile No results for input(s): INR, PROTIME in the last 168 hours.  CBC: Recent Labs  Lab 07/16/19 1130 07/17/19 0043 07/18/19 0510  WBC 6.4 4.5 3.4*  HGB 10.4* 8.8* 9.2*  HCT 31.4* 26.3* 29.1*  MCV 79.7* 77.8* 80.4  PLT 167 142* 161   Cardiac Enzymes: No results for input(s): CKTOTAL, CKMB, CKMBINDEX, TROPONINI in the last 168 hours. BNP (last 3 results) No results for input(s): PROBNP in the last 8760 hours. CBG: Recent Labs  Lab 07/16/19 2108 07/17/19 0737 07/17/19 1112 07/17/19 1641 07/17/19 2132  GLUCAP 212* 147* 140* 133* 162*   D-Dimer: Recent Labs    07/16/19 1730  DDIMER 1.26*   Hgb A1c: Recent Labs    07/17/19 0043  HGBA1C 9.7*   Lipid Profile: Recent Labs    07/17/19 0043  CHOL 178  HDL 54  LDLCALC 110*  TRIG 68  CHOLHDL 3.3   Thyroid function studies: No results for input(s): TSH, T4TOTAL, T3FREE, THYROIDAB in the last 72 hours.  Invalid input(s):  FREET3 Anemia work up: No results for input(s): VITAMINB12, FOLATE, FERRITIN, TIBC, IRON, RETICCTPCT in the last 72 hours. Sepsis Labs: Recent Labs  Lab 07/16/19 1130 07/17/19 0043 07/18/19 0510  WBC 6.4 4.5 3.4*    Microbiology Recent Results (from the past 240 hour(s))  SARS CORONAVIRUS 2 (TAT 6-24 HRS) Nasopharyngeal Nasopharyngeal Swab     Status: None   Collection Time: 07/16/19  4:35 PM   Specimen: Nasopharyngeal Swab  Result Value Ref Range Status   SARS Coronavirus 2 NEGATIVE NEGATIVE Final    Comment: (NOTE) SARS-CoV-2 target nucleic acids are NOT DETECTED. The SARS-CoV-2 RNA is generally detectable in upper and lower respiratory specimens during the acute phase of infection. Negative results do not preclude SARS-CoV-2 infection, do not rule out co-infections with other pathogens, and should not be used as the sole basis for treatment or other patient management decisions. Negative results must be combined with clinical observations, patient history, and epidemiological information. The expected result is Negative. Fact Sheet for Patients: SugarRoll.be  Fact Sheet for Healthcare Providers: https://www.woods-mathews.com/ This test is not yet approved or cleared by the [place] FDA and  has been authorized for detection and/or diagnosis of SARS-CoV-2 by FDA under an Emergency Use Authorization (EUA). This EUA will remain  in effect (meaning this test can be used) for the duration of the COVID-19 declaration under Section 56 4(b)(1) of the Act, 21 U.S.C. section 360bbb-3(b)(1), unless the authorization is terminated or revoked sooner. Performed at [facility], [place] [address] [place], [place] [address]     Procedures and diagnostic studies:  Dg Chest 2 View  Result Date: 07/16/2019 CLINICAL DATA:  Chest pain EXAM: CHEST - 2 VIEW COMPARISON:  Chest radiograph dated 04/11/2019 FINDINGS: The heart size and  mediastinal contours are within normal limits. Median sternotomy wires, vascular stents, and a left atrial appendage clip are redemonstrated. There is elevation of the right hemidiaphragm. Both lungs are clear. Degenerative changes are seen in the spine. IMPRESSION: No acute cardiopulmonary findings. Electronically Signed   By: Zerita Boers M.D.   On: 07/16/2019 12:13   Nm Pulmonary Perfusion  Result Date: 07/17/2019 CLINICAL DATA:  Chest pain. EXAM: NUCLEAR MEDICINE PERFUSION LUNG SCAN TECHNIQUE: Perfusion images were obtained in multiple projections after intravenous injection of radiopharmaceutical. Ventilation scans intentionally deferred if perfusion scan and chest x-ray adequate for interpretation during COVID 19 epidemic. RADIOPHARMACEUTICALS:  1.51 mCi Tc-74m MAA IV COMPARISON:  Radiograph of July 16, 2019. FINDINGS: No definite defect is noted on the perfusion study. Ventilation study is not necessary. IMPRESSION: No definite perfusion defect noted. No definite evidence of pulmonary embolus. Electronically Signed   By: Marijo Conception M.D.   On: 07/17/2019 12:43   Mr Foot Right Wo Contrast  Result Date: 07/17/2019 CLINICAL DATA:  Diabetic patient with right second toe pain and swelling. EXAM: MRI OF THE RIGHT FOREFOOT WITHOUT CONTRAST TECHNIQUE: Multiplanar, multisequence MR imaging of the right forefoot was performed. No intravenous contrast was administered. COMPARISON:  Plain films right foot 07/16/2019 FINDINGS: Bones/Joint/Cartilage There is marrow edema throughout the distal phalanx of the second toe consistent with osteomyelitis. No other evidence of osteomyelitis is identified. No fracture, stress change or worrisome lesion. No joint effusion. Ligaments Intact. Muscles and Tendons No intramuscular fluid collection is seen. The extensor tendon of the second toe appears torn from the distal phalanx without retraction. Soft tissues A fluid collection in the distal soft tissues of the  second toe surrounding the distal phalanx measures approximately 1.2 cm AP x 2.4 cm transverse x 2.1 cm craniocaudal. There is some subcutaneous edema over the dorsum of the foot. IMPRESSION: Findings consistent with osteomyelitis throughout the distal phalanx of the second toe. Fluid collection surrounding the distal phalanx is consistent with an abscess or blister. The extensor tendon of the second toe appears torn from the distal phalanx without retraction. Electronically Signed   By: Inge Rise M.D.   On: 07/17/2019 09:40   Dg Foot Complete Right  Result Date: 07/16/2019 CLINICAL DATA:  Infection of the second toe. Swelling. EXAM: RIGHT FOOT COMPLETE - 3+ VIEW COMPARISON:  None. FINDINGS: There is swelling of the soft tissues of the distal second toe. On the lateral view the plantar aspect of the distal phalanx the second toe appears to be eroded away. This is not appreciable on the AP and oblique views. Old healed fractures of the fourth and fifth metatarsal shafts. Slight arthritic changes of the first MTP joint. IMPRESSION: 1. Soft tissue swelling of the distal  second toe. 2. The appearance of the plantar aspect of the distal phalanx of the second toe is suspicious for osteomyelitis. 3. Arthritic changes of the first MTP joint. Electronically Signed   By: Lorriane Shire M.D.   On: 07/16/2019 16:42   Vas [place] Lower Extremity Venous (dvt)  Result Date: 07/17/2019  Lower Venous Study Indications: Pulmonary embolism.  Comparison Study: Prior study from 05/04/18 is available for comparison Performing Technologist: Sharion Dove RVS  Examination Guidelines: A complete evaluation includes B-mode imaging, spectral Doppler, color Doppler, and power Doppler as needed of all accessible portions of each vessel. Bilateral testing is considered an integral part of a complete examination. Limited examinations for reoccurring indications may be performed as noted.   +---------+---------------+---------+-----------+----------+--------------+ RIGHT    CompressibilityPhasicitySpontaneityPropertiesThrombus Aging +---------+---------------+---------+-----------+----------+--------------+ CFV      Full           Yes      Yes                                 +---------+---------------+---------+-----------+----------+--------------+ SFJ      Full                                                        +---------+---------------+---------+-----------+----------+--------------+ FV Prox  Full                                                        +---------+---------------+---------+-----------+----------+--------------+ FV Mid   Full                                                        +---------+---------------+---------+-----------+----------+--------------+ FV DistalFull                                                        +---------+---------------+---------+-----------+----------+--------------+ PFV      Full                                                        +---------+---------------+---------+-----------+----------+--------------+ POP      Full           Yes      Yes                                 +---------+---------------+---------+-----------+----------+--------------+ PTV      Full                                                        +---------+---------------+---------+-----------+----------+--------------+  PERO     Full                                                        +---------+---------------+---------+-----------+----------+--------------+   +---------+---------------+---------+-----------+----------+--------------+ LEFT     CompressibilityPhasicitySpontaneityPropertiesThrombus Aging +---------+---------------+---------+-----------+----------+--------------+ CFV      Full           Yes      Yes                                  +---------+---------------+---------+-----------+----------+--------------+ SFJ      Full                                                        +---------+---------------+---------+-----------+----------+--------------+ FV Prox  Full                                                        +---------+---------------+---------+-----------+----------+--------------+ FV Mid   Full                                                        +---------+---------------+---------+-----------+----------+--------------+ FV DistalFull                                                        +---------+---------------+---------+-----------+----------+--------------+ PFV      Full                                                        +---------+---------------+---------+-----------+----------+--------------+ POP      Full           Yes      Yes                                 +---------+---------------+---------+-----------+----------+--------------+ PTV      Full                                                        +---------+---------------+---------+-----------+----------+--------------+ PERO     Full                                                        +---------+---------------+---------+-----------+----------+--------------+  Summary: Right: Findings appear essentially unchanged compared to previous examination. There is no evidence of deep vein thrombosis in the lower extremity. Left: Findings appear essentially unchanged compared to previous examination. There is no evidence of deep vein thrombosis in the lower extremity.  *See table(s) above for measurements and observations.    Preliminary     Medications:   . allopurinol  300 mg Oral Daily  . aspirin  81 mg Oral Daily  . colchicine  0.6 mg Oral Daily  . diltiazem  120 mg Oral Daily  . emtricitabine-rilpivir-tenofovir AF  1 tablet Oral Q breakfast  . famotidine  20 mg Oral BID  . feeding supplement  (ENSURE ENLIVE)  237 mL Oral BID BM  . ferrous sulfate  325 mg Oral BID WC  . folic acid  1 mg Oral Daily  . gabapentin  400 mg Oral BID  . insulin aspart  0-9 Units Subcutaneous TID WC  . insulin glargine  14 Units Subcutaneous QHS  . multivitamin  1 tablet Oral Daily  . nutrition supplement (JUVEN)  1 packet Oral BID BM  . risperiDONE  0.25 mg Oral BID  . sertraline  50 mg Oral Daily  . sodium chloride flush  3 mL Intravenous Once  . thiamine  100 mg Oral Daily   Continuous Infusions: . cefTRIAXone (ROCEPHIN)  IV 2 g (07/17/19 1330)   And  . metronidazole 500 mg (07/18/19 0332)     LOS: 1 day   Geradine Girt  [facility]   How to contact the [facility] Attending or Consulting provider [place] or covering provider during after hours [place], for this patient?  1. Check the care team in [facility] and look for a) attending/consulting [facility] provider listed and b) the [facility] team listed 2. Log into [email or web address] and use [place]'s universal password to access. If you do not have the password, please contact the hospital operator. 3. Locate the [facility] provider you are looking for under [facility] and page to a number that you can be directly reached. 4. If you still have difficulty reaching the provider, please page the [facility] (Director on Call) for the Hospitalists listed on amion for assistance.  07/18/2019, 8:07 AM

## 2019-07-19 ENCOUNTER — Inpatient Hospital Stay (HOSPITAL_COMMUNITY): Payer: Medicare Other

## 2019-07-19 ENCOUNTER — Telehealth (HOSPITAL_COMMUNITY): Payer: Self-pay | Admitting: Pharmacist

## 2019-07-19 DIAGNOSIS — L97519 Non-pressure chronic ulcer of other part of right foot with unspecified severity: Secondary | ICD-10-CM

## 2019-07-19 DIAGNOSIS — L0390 Cellulitis, unspecified: Secondary | ICD-10-CM

## 2019-07-19 LAB — GLUCOSE, CAPILLARY
Glucose-Capillary: 126 mg/dL — ABNORMAL HIGH (ref 70–99)
Glucose-Capillary: 141 mg/dL — ABNORMAL HIGH (ref 70–99)
Glucose-Capillary: 125 mg/dL — ABNORMAL HIGH (ref 70–99)
Glucose-Capillary: 131 mg/dL — ABNORMAL HIGH (ref 70–99)
Glucose-Capillary: 163 mg/dL — ABNORMAL HIGH (ref 70–99)

## 2019-07-19 LAB — CBC
HCT: 29 % — ABNORMAL LOW (ref 39.0–52.0)
Hemoglobin: 9.4 g/dL — ABNORMAL LOW (ref 13.0–17.0)
MCH: 25.5 pg — ABNORMAL LOW (ref 26.0–34.0)
MCHC: 32.4 g/dL (ref 30.0–36.0)
MCV: 78.8 fL — ABNORMAL LOW (ref 80.0–100.0)
Platelets: 178 10*3/uL (ref 150–400)
RBC: 3.68 MIL/uL — ABNORMAL LOW (ref 4.22–5.81)
RDW: 13.9 % (ref 11.5–15.5)
WBC: 4.1 10*3/uL (ref 4.0–10.5)
nRBC: 0 % (ref 0.0–0.2)

## 2019-07-19 LAB — SURGICAL PCR SCREEN
MRSA, PCR: NEGATIVE
Staphylococcus aureus: NEGATIVE

## 2019-07-19 MED ORDER — MUPIROCIN 2 % EX OINT
1.0000 "application " | TOPICAL_OINTMENT | Freq: Two times a day (BID) | CUTANEOUS | Status: AC
  Administered 2019-07-19 – 2019-07-24 (×9): 1 via NASAL
  Filled 2019-07-19 (×2): qty 22

## 2019-07-19 MED ORDER — FAMOTIDINE 20 MG PO TABS
20.0000 mg | ORAL_TABLET | Freq: Every day | ORAL | Status: DC
  Administered 2019-07-19 – 2019-08-02 (×15): 20 mg via ORAL
  Filled 2019-07-19 (×16): qty 1

## 2019-07-19 MED FILL — ODEFSEY 200-25-25 MG TABS: 200-25-25 | 30 days supply | Qty: 30 | Fill #2

## 2019-07-19 NOTE — Progress Notes (Signed)
Pt had c/o nausea and had vomited x1. BP 185/84. Hydralazine 25 mg p.o. given. Ondansetron also given for vomiting. Will continue to  Monitor. Will update MD.

## 2019-07-19 NOTE — Progress Notes (Signed)
Patient ID: Michael [name], male   DOB: 05/12/1951, 68 y.o.   MRN: [id number] Patient is seen in follow-up for osteomyelitis and ulceration right foot second toe.  Patient's chest pain is not felt to be associated with a cardiac origin.  I will plan to proceed with amputation of the right second toe on Wednesday.  Patient will need no further antibiotics 24 hours after surgery.

## 2019-07-19 NOTE — Progress Notes (Signed)
I spoke with Michael [name] today about his HIV medication and need for follow-up in the clinic.  Unfortunately Michael [name] has missed several appointments in the infectious diseases clinic due to transportation and issues with getting in touch with him on the phone.    I counseled Michael [name] today on the importance of taking his Vernell Leep every day and the interaction between [place] and [place]. He agreed to take his Pepcid once a day at night and his O[place]sey in the morning to separate them by about 12 hours.   I will arrange for [place] to courier a refill of his Vernell Leep to [facility] and will give this to Michael [name] before he is discharged.    I also arranged a follow-up appointment for him with Janene Madeira, NP on 08/17/19 at 1:45 PM. He will need transportation arranged for this visit.   He can be reached at his new cell phone number at [phone].     Jimmy Footman, PharmD, BCPS, BCIDP Infectious Diseases Clinical Pharmacist Phone: [phone] 07/19/2019 11:29 AM

## 2019-07-19 NOTE — Telephone Encounter (Signed)
Thank you, Raquel Sarna. I updated his phone number in the demographics section as well so we can reach him. So appreciate your care!

## 2019-07-19 NOTE — TOC Initial Note (Addendum)
Transition of Care [facility]) - Initial/Assessment Note    Patient Details  Name: Michael [name] MRN: [id number] Date of Birth: March 30, 1951  Transition [facility]) CM/SW Contact:    Bethena Roys, RN Phone Number: 07/19/2019, 1:54 PM  Clinical Narrative:  Pt presented for chest pain-Hx HIV and is seen a the infectious disease clinic. Per notes patient has not been compliant due to transportation. Pt has osteomyelitis of the right second toe- plan for amputation on 07-21-19. PTA patient stayed in a motel- he now states his income is limited and will not be able to return. CM did discuss that patient will benefit from PT/OT consult post surgery. Options patient were given for shelter were to reach out to family in [facility] to see if can stay with them, offered him a shelter list vs if PT/OT recommends SNF. Once PT/OT consults and if the plan is for home patient would be a good candidate for [facility]. CM did reach out to [facility] as well to see if he is being followed for community resources. CM will continue to monitor for additional transition of care needs as the patient progresses.             Expected Discharge Plan: Skilled Nursing Facility Barriers to Discharge: Continued Medical Work up(Plan for toe amputation on Wednesday 07-21-19)    Expected Discharge Plan and Services Expected Discharge Plan: [place]   Discharge Planning Services: CM Consult   Living arrangements for the past 2 months: Hotel/Motel                   Prior Living Arrangements/Services Living arrangements for the past 2 months: Hotel/Motel Lives with:: Self Patient language and need for interpreter reviewed:: Yes Do you feel safe going back to the place where you live?: Yes(However has limited income)      Need for Family Participation in Patient Care: Yes (Comment) Care giver support system in place?: Yes (comment) Current home services: (none) Criminal Activity/Legal  Involvement Pertinent to Current Situation/Hospitalization: No - Comment as needed  Activities of Daily Living Home Assistive Devices/Equipment: CBG Meter ADL Screening (condition at time of admission) Patient's cognitive ability adequate to safely complete daily activities?: Yes Is the patient deaf or have difficulty hearing?: No Does the patient have difficulty seeing, even when wearing glasses/contacts?: No Does the patient have difficulty concentrating, remembering, or making decisions?: No Patient able to express need for assistance with ADLs?: Yes Does the patient have difficulty dressing or bathing?: No Independently performs ADLs?: Yes (appropriate for developmental age) Does the patient have difficulty walking or climbing stairs?: No Weakness of Legs: Both Weakness of Arms/Hands: None   Emotional Assessment Appearance:: Appears stated age Attitude/Demeanor/Rapport: Engaged Affect (typically observed): Accepting Orientation: : Oriented to Situation, Oriented to  Time, Oriented to Place, Oriented to Self Alcohol / Substance Use: Not Applicable Psych Involvement: No (comment)  Admission diagnosis:  Cocaine abuse ([name]) [F14.10] Abscess or cellulitis of toe, right [L03.031, L02.611] Chest pain, unspecified type [R07.9] HIV infection, unspecified symptom status ([place]) [B20] Patient Active Problem List   Diagnosis Date Noted  . Osteomyelitis ([place]) 07/17/2019  . Abscess or cellulitis of toe, right   . Toe pain, right-second   . MDD (major depressive disorder), recurrent episode, severe ([place]) 06/15/2019  . Respiratory distress 04/12/2019  . Pneumonia due to severe acute respiratory syndrome coronavirus 2 (SARS-CoV-2) 04/11/2019  . COVID-19 virus infection 03/19/2019  . Chest pain 10/03/2018  . Malnutrition of moderate  degree 09/21/2018  . MDD (major depressive disorder), recurrent episode, moderate ([place])   . Type II diabetes mellitus with renal manifestations ([place]) 05/04/2018   . GERD (gastroesophageal reflux disease) 05/04/2018  . S/P CABG (coronary artery bypass graft)   . Left testicular pain   . Depression 02/20/2018  . Epididymo-orchitis, acute 02/20/2018  . Essential hypertension 02/20/2018  . Precordial chest pain 02/20/2018  . AKI (acute kidney injury) ([place]) 02/14/2018  . HCAP (healthcare-associated pneumonia) 02/14/2018  . History of pulmonary embolism 07/04/2017  . Acute hyponatremia 05/11/2017  . Hyperglycemia due to type 2 diabetes mellitus ([place]) 05/11/2017  . CHF (congestive heart failure) ([place]) 04/01/2017  . Hepatitis C 12/30/2016  . Chronic ischemic heart disease 11/12/2016  . Paroxysmal A-fib ([place]) 11/12/2016  . Back pain 04/18/2016  . S/P carotid endarterectomy 11/15/2015  . MDD (major depressive disorder), recurrent severe, without psychosis ([place]) 09/09/2015  . Cocaine-induced mood disorder ([place]) 08/14/2015  . Cocaine abuse with cocaine-induced mood disorder ([place]) 08/14/2015  . Gout 07/10/2015  . Acute renal failure superimposed on stage 3 chronic kidney disease ([place]) 03/06/2015  . Normocytic anemia 03/06/2015  . Chronic kidney disease, stage III (moderate) 03/06/2015  . Hypoglycemia   . Encounter for general adult medical examination with abnormal findings 02/09/2015  . Cocaine use disorder, severe, dependence ([place]) 12/13/2014  . Substance or medication-induced depressive disorder with onset during withdrawal ([place]) 12/13/2014  . Severe recurrent major depressive disorder with psychotic features ([place]) 12/12/2014  . Cervicalgia 06/28/2014  . Lumbar radiculopathy, chronic 06/28/2014  . Asthma, chronic 02/03/2014  . 3-vessel CAD 06/24/2013  . ED (erectile dysfunction) of organic origin 07/07/2012  . Hypertension goal BP (blood pressure) < 140/80 04/29/2012  . Chondromalacia of left knee 03/19/2012  . Hyperlipidemia with target LDL less than 100 02/12/2012  . Fibromyalgia 02/12/2012  . HIV (human immunodeficiency virus infection) ([place])  08/27/2011  . Uncontrolled type 2 diabetes with neuropathy ([place]) 10/17/2000   PCP:  [name], PA-C Pharmacy:   [facility] [place] [address] [place] [address] Phone: [phone] Fax: [phone]     Social Determinants of Health (SDOH) Interventions    Readmission Risk Interventions Readmission Risk Prevention Plan 07/19/2019  Transportation Screening Complete  Medication Review (RN Care Manager) Complete  HRI or Home Care Consult Complete  Palliative Care Screening Not Applicable  Some recent data might be hidden

## 2019-07-19 NOTE — Consult Note (Signed)
   [facility] Inpatient Consult   07/19/2019  Yasuo Phimmasone [birth year] [id number]   Thank you for this consult.  Patient evaluated for [place] [facility] services.  Patient is not currently a beneficiary of the attributed [place] in the [employer].     This patient is Not eligible for [facility] Care Management Services.    Reason:  Not a beneficiary currently attributed to one of the [place].   embership roster was used to verify non-eligible status.   For questions, please call:  Natividad Brood, RN BSN [facility] Liaison  [phone] business mobile phone Toll free office [phone]  Fax number: [phone] [email or web address]

## 2019-07-19 NOTE — Progress Notes (Signed)
Inpatient Diabetes Program Recommendations  AACE/ADA: New Consensus Statement on Inpatient Glycemic Control (2015)  Target Ranges:  Prepandial:   less than 140 mg/dL      Peak postprandial:   less than 180 mg/dL (1-2 hours)      Critically ill patients:  140 - 180 mg/dL   Lab Results  Component Value Date   GLUCAP 125 (H) 07/19/2019   HGBA1C 9.7 (H) 07/17/2019  Results for AMOGH, KOMATSU (MRN [id number]) as of 07/19/2019 14:56  Ref. Range 04/15/2019 11:45 06/16/2019 06:25 07/17/2019 00:43  Hemoglobin A1C Latest Ref Range: 4.8 - 5.6 % 13.2 (H) 10.9 (H) 9.7 (H)    Review of Glycemic Control  Diabetes history: DM 2 Outpatient Diabetes medications: Lantus 20 units qhs, Humalog 4-6 units Daily Current orders for Inpatient glycemic control:  Lantus 14 units qhs Novolog 0-9 units tid  Spoke with patient regarding A1c level. Encouraged patient to continue to work on his A1c for a goal of 7%. Pt reports having been awhile since seeing his PCP. Patient reports having all DM supplies and insulin at home.  Thanks,  Tama Headings RN, MSN, BC-ADM Inpatient Diabetes Coordinator Team Pager [phone] (8a-5p)

## 2019-07-19 NOTE — H&P (View-Only) (Signed)
Patient ID: Michael [name], male   DOB: April 14, 1951, 68 y.o.   MRN: [id number] Patient is seen in follow-up for osteomyelitis and ulceration right foot second toe.  Patient's chest pain is not felt to be associated with a cardiac origin.  I will plan to proceed with amputation of the right second toe on Wednesday.  Patient will need no further antibiotics 24 hours after surgery.

## 2019-07-19 NOTE — Progress Notes (Signed)
Progress Note    Michael [name]  LFY:[id number] DOB: [birth year]  DOA: 07/16/2019 PCP: [name], PA-C    Brief Narrative:     Medical records reviewed and are as summarized below:  Michael [name] is an 68 y.o. male with medical history significant of hypertension, hyperlipidemia, diabetes mellitus, asthma, GERD, gout, depression, cocaine abuse, h/o PE not on anticoagulants any longer, HIV, CAD, s/po of CABG, s/p of stent placement, atrial fibrillation not on anticoagulants, CKD stage III, iron deficiency anemia, who presents with chest and toe pain.  Recent cocaine use.  Assessment/Plan:   Principal Problem:   Chest pain Active Problems:   HIV (human immunodeficiency virus infection) (Michael [name])   Hyperlipidemia with target LDL less than 100   3-vessel CAD   Severe recurrent major depressive disorder with psychotic features (Michael [name])   Cocaine use disorder, severe, dependence (Michael [name])   Gout   Essential hypertension   Type II diabetes mellitus with renal manifestations ([name])   GERD (gastroesophageal reflux disease)   S/P CABG (coronary artery bypass graft)   Chronic kidney disease, stage III (moderate)   Paroxysmal A-fib ([name])   Toe pain, right-second   Osteomyelitis ([name])   Abscess or cellulitis of toe, right   Osteomyelitis of the right-second toe:   -X-ray showed that the appearance of the plantar aspect of the distal phalanx of the second toe is suspicious for osteomyelitis.   -MRI: Findings consistent with osteomyelitis throughout the distal phalanx of the second toe. Fluid collection surrounding the distal phalanx is consistent with an abscess or blister. --blood culture x2 NGTD -will order focused lower ext wound order set- ABI pending, elevated sed and c-reactive protein -IV abx started -ortho consult- Dr. [name] -plan for amputation on Wednesday-- can d/c abx 24 hours after surgery  Chest pain and hx of 3-vessel CAD:  -CE reassuring -suspect cocaine  abuse contributing- do not see any need for cardiology consult at this point -V/Q negative for PE  HIV (human immunodeficiency virus infection) (Michael [name]): CD4= 270 on 12/11/9 -continue home HIV meds -follows with Dr. Johnnye [name] -? Of compliance with medications  Hyperlipidemia with target LDL less than 100: not taking meds now. -LDL 110  Severe recurrent major depressive disorder with psychotic features (Michael [name]): no SI or HI. -recent stay at [facility]  Cocaine use disorder, severe, dependence (Michael [name]): -needs cessation  Gout: -allopurinol and Colchicine  Essential hypertension: -prn oral hydralazine Continue Cardizem  -avoid BB in light of cocaine use  Type II diabetes mellitus with renal manifestations (Michael [name]):  -poorly controled. Patient is taking metformin, Humalog and Lantus at home -SSI -may need long acting started pending trend of blood sugars  GERD (gastroesophageal reflux disease): -pepcid  Chronic kidney disease, stage III (moderate): Close to baseline.  Baseline creatinine 1.4-1.8.   -daily BMP  Paroxysmal A-fib (Michael [name]): CHA2DS2-VASc Score is 4: pt is not on AC, not sure why he is not on [facility]-- ? Non-compliance-- chart review not helpful in determining why -continue cardizem -discuss with patient prior to d/c-- will need compliance and close follow up  Anemia ?ckd -trend- h/h stable    Family Communication/Anticipated D/C date and plan/Code Status   DVT prophylaxis: heparin SQ Code Status: Full Code.  Family Communication:  Disposition Plan: pending amputation of toe   Medical Consultants:    ortho  Subjective:   No fevers, no chills  Objective:    Vitals:   07/18/19 2045 07/19/19 0500 07/19/19 0536 07/19/19 0821  BP:  103/66 (!) 155/89  Pulse:   80   Resp:  14    Temp: 98.1 F (36.7 C)  98.6 F (37 C)   TempSrc: Oral  Oral   SpO2:   97%   Weight:   76.8 kg   Height:        Intake/Output Summary (Last 24 hours) at 07/19/2019 0849 Last  data filed at 07/19/2019 0500 Gross per 24 hour  Intake 816 ml  Output 2375 ml  Net -1559 ml   Filed Weights   07/17/19 0408 07/18/19 0451 07/19/19 0536  Weight: 73 kg 74.8 kg 76.8 kg    Exam: In bed, resting comfortable No increased work of breathing rrr Right 2nd toe draining serosanguinous blood A+Ox3  Data Reviewed:   I have personally reviewed following labs and imaging studies:  Labs: Labs show the following:   Basic Metabolic Panel: Recent Labs  Lab 07/16/19 1452 07/18/19 0510  NA 139 141  K 3.9 4.0  CL 104 111  CO2 23 20*  GLUCOSE 339* 155*  BUN 34* 18  CREATININE 1.70* 1.42*  CALCIUM 9.4 8.5*   GFR Estimated Creatinine Clearance: 47.5 mL/min (A) (by C-G formula based on SCr of 1.42 mg/dL (H)). Liver Function Tests: No results for input(s): AST, ALT, ALKPHOS, BILITOT, PROT, ALBUMIN in the last 168 hours. No results for input(s): LIPASE, AMYLASE in the last 168 hours. No results for input(s): AMMONIA in the last 168 hours. Coagulation profile No results for input(s): INR, PROTIME in the last 168 hours.  CBC: Recent Labs  Lab 07/16/19 1130 07/17/19 0043 07/18/19 0510 07/19/19 0613  WBC 6.4 4.5 3.4* 4.1  HGB 10.4* 8.8* 9.2* 9.4*  HCT 31.4* 26.3* 29.1* 29.0*  MCV 79.7* 77.8* 80.4 78.8*  PLT 167 142* 161 178   Cardiac Enzymes: No results for input(s): CKTOTAL, CKMB, CKMBINDEX, TROPONINI in the last 168 hours. BNP (last 3 results) No results for input(s): PROBNP in the last 8760 hours. CBG: Recent Labs  Lab 07/18/19 0814 07/18/19 1128 07/18/19 1618 07/18/19 2122 07/19/19 0734  GLUCAP 115* 231* 172* 168* 126*   D-Dimer: Recent Labs    07/16/19 1730  DDIMER 1.26*   Hgb A1c: Recent Labs    07/17/19 0043  HGBA1C 9.7*   Lipid Profile: Recent Labs    07/17/19 0043  CHOL 178  HDL 54  LDLCALC 110*  TRIG 68  CHOLHDL 3.3   Thyroid function studies: No results for input(s): TSH, T4TOTAL, T3FREE, THYROIDAB in the last 72  hours.  Invalid input(s): FREET3 Anemia work up: No results for input(s): VITAMINB12, FOLATE, FERRITIN, TIBC, IRON, RETICCTPCT in the last 72 hours. Sepsis Labs: Recent Labs  Lab 07/16/19 1130 07/17/19 0043 07/18/19 0510 07/19/19 0613  WBC 6.4 4.5 3.4* 4.1    Microbiology Recent Results (from the past 240 hour(s))  SARS CORONAVIRUS 2 (TAT 6-24 HRS) Nasopharyngeal Nasopharyngeal Swab     Status: None   Collection Time: 07/16/19  4:35 PM   Specimen: Nasopharyngeal Swab  Result Value Ref Range Status   SARS Coronavirus 2 NEGATIVE NEGATIVE Final    Comment: (NOTE) SARS-CoV-2 target nucleic acids are NOT DETECTED. The SARS-CoV-2 RNA is generally detectable in upper and lower respiratory specimens during the acute phase of infection. Negative results do not preclude SARS-CoV-2 infection, do not rule out co-infections with other pathogens, and should not be used as the sole basis for treatment or other patient management decisions. Negative results must be combined with clinical observations, patient history, and  epidemiological information. The expected result is Negative. Fact Sheet for Patients: SugarRoll.be Fact Sheet for Healthcare Providers: https://www.woods-mathews.com/ This test is not yet approved or cleared by the [place] FDA and  has been authorized for detection and/or diagnosis of SARS-CoV-2 by FDA under an Emergency Use Authorization (EUA). This EUA will remain  in effect (meaning this test can be used) for the duration of the COVID-19 declaration under Section 56 4(b)(1) of the Act, 21 U.S.C. section 360bbb-3(b)(1), unless the authorization is terminated or revoked sooner. Performed at [facility], [place] [address], [place], [place] [address]   Culture, blood (Routine X 2) w Reflex to ID Panel     Status: None (Preliminary result)   Collection Time: 07/17/19 12:43 AM   Specimen: BLOOD  Result Value Ref Range  Status   Specimen Description BLOOD RIGHT ARM  Final   Special Requests   Final    BOTTLES DRAWN AEROBIC AND ANAEROBIC Blood Culture adequate volume   Culture   Final    NO GROWTH 2 DAYS Performed at [facility], [place] [address] [place], [place] [address]    Report Status PENDING  Incomplete  Culture, blood (Routine X 2) w Reflex to ID Panel     Status: None (Preliminary result)   Collection Time: 07/17/19  1:00 AM   Specimen: BLOOD  Result Value Ref Range Status   Specimen Description BLOOD LEFT HAND  Final   Special Requests   Final    BOTTLES DRAWN AEROBIC AND ANAEROBIC Blood Culture adequate volume   Culture   Final    NO GROWTH 2 DAYS Performed at [facility], [place] [address] [place], [place] [address]    Report Status PENDING  Incomplete    Procedures and diagnostic studies:  Nm Pulmonary Perfusion  Result Date: 07/17/2019 CLINICAL DATA:  Chest pain. EXAM: NUCLEAR MEDICINE PERFUSION LUNG SCAN TECHNIQUE: Perfusion images were obtained in multiple projections after intravenous injection of radiopharmaceutical. Ventilation scans intentionally deferred if perfusion scan and chest x-ray adequate for interpretation during COVID 19 epidemic. RADIOPHARMACEUTICALS:  1.51 mCi Tc-69m MAA IV COMPARISON:  Radiograph of July 16, 2019. FINDINGS: No definite defect is noted on the perfusion study. Ventilation study is not necessary. IMPRESSION: No definite perfusion defect noted. No definite evidence of pulmonary embolus. Electronically Signed   By: Marijo Conception M.D.   On: 07/17/2019 12:43   Vas [place] Lower Extremity Venous (dvt)  Result Date: 07/18/2019  Lower Venous Study Indications: Pulmonary embolism.  Comparison Study: Prior study from 05/04/18 is available for comparison Performing Technologist: Sharion Dove RVS  Examination Guidelines: A complete evaluation includes B-mode imaging, spectral Doppler, color Doppler, and power Doppler as needed of all accessible portions  of each vessel. Bilateral testing is considered an integral part of a complete examination. Limited examinations for reoccurring indications may be performed as noted.  +---------+---------------+---------+-----------+----------+--------------+ RIGHT    CompressibilityPhasicitySpontaneityPropertiesThrombus Aging +---------+---------------+---------+-----------+----------+--------------+ CFV      Full           Yes      Yes                                 +---------+---------------+---------+-----------+----------+--------------+ SFJ      Full                                                        +---------+---------------+---------+-----------+----------+--------------+  FV Prox  Full                                                        +---------+---------------+---------+-----------+----------+--------------+ FV Mid   Full                                                        +---------+---------------+---------+-----------+----------+--------------+ FV DistalFull                                                        +---------+---------------+---------+-----------+----------+--------------+ PFV      Full                                                        +---------+---------------+---------+-----------+----------+--------------+ POP      Full           Yes      Yes                                 +---------+---------------+---------+-----------+----------+--------------+ PTV      Full                                                        +---------+---------------+---------+-----------+----------+--------------+ PERO     Full                                                        +---------+---------------+---------+-----------+----------+--------------+   +---------+---------------+---------+-----------+----------+--------------+ LEFT     CompressibilityPhasicitySpontaneityPropertiesThrombus Aging  +---------+---------------+---------+-----------+----------+--------------+ CFV      Full           Yes      Yes                                 +---------+---------------+---------+-----------+----------+--------------+ SFJ      Full                                                        +---------+---------------+---------+-----------+----------+--------------+ FV Prox  Full                                                        +---------+---------------+---------+-----------+----------+--------------+  FV Mid   Full                                                        +---------+---------------+---------+-----------+----------+--------------+ FV DistalFull                                                        +---------+---------------+---------+-----------+----------+--------------+ PFV      Full                                                        +---------+---------------+---------+-----------+----------+--------------+ POP      Full           Yes      Yes                                 +---------+---------------+---------+-----------+----------+--------------+ PTV      Full                                                        +---------+---------------+---------+-----------+----------+--------------+ PERO     Full                                                        +---------+---------------+---------+-----------+----------+--------------+     Summary: Right: Findings appear essentially unchanged compared to previous examination. There is no evidence of deep vein thrombosis in the lower extremity. Left: Findings appear essentially unchanged compared to previous examination. There is no evidence of deep vein thrombosis in the lower extremity.  *See table(s) above for measurements and observations. Electronically signed by Ruta Hinds MD on 07/18/2019 at 12:41:35 PM.    Final     Medications:   . allopurinol  300 mg Oral Daily   . aspirin  81 mg Oral Daily  . colchicine  0.6 mg Oral Daily  . diltiazem  120 mg Oral Daily  . emtricitabine-rilpivir-tenofovir AF  1 tablet Oral Q breakfast  . famotidine  20 mg Oral BID  . feeding supplement (ENSURE ENLIVE)  237 mL Oral BID BM  . ferrous sulfate  325 mg Oral BID WC  . folic acid  1 mg Oral Daily  . gabapentin  400 mg Oral BID  . heparin injection (subcutaneous)  5,000 Units Subcutaneous Q8H  . insulin aspart  0-9 Units Subcutaneous TID WC  . insulin glargine  14 Units Subcutaneous QHS  . multivitamin  1 tablet Oral Daily  . nutrition supplement (JUVEN)  1 packet Oral BID BM  . risperiDONE  0.25 mg Oral BID  . sertraline  50 mg Oral Daily  . sodium chloride flush  3 mL Intravenous Once  . thiamine  100 mg Oral Daily   Continuous Infusions: . cefTRIAXone (ROCEPHIN)  IV 2 g (07/18/19 1650)   And  . metronidazole 500 mg (07/19/19 0258)     LOS: 2 days   Geradine Girt  [facility]   How to contact the [facility] Attending or Consulting provider [place] or covering provider during after hours [place], for this patient?  1. Check the care team in [facility] and look for a) attending/consulting [facility] provider listed and b) the [facility] team listed 2. Log into [email or web address] and use [place] universal password to access. If you do not have the password, please contact the hospital operator. 3. Locate the [facility] provider you are looking for under [facility] and page to a number that you can be directly reached. 4. If you still have difficulty reaching the provider, please page the [facility] (Director on Call) for the Hospitalists listed on amion for assistance.  07/19/2019, 8:49 AM

## 2019-07-19 NOTE — Progress Notes (Signed)
VASCULAR LAB PRELIMINARY  PRELIMINARY  PRELIMINARY  PRELIMINARY  ABIs completed.    Preliminary report:  See CV proc for preliminary results.  Adaleena Mooers, RVT 07/19/2019, 6:10 PM

## 2019-07-19 NOTE — Consult Note (Signed)
PV Navigator consult acknowledged and chart reviewed. I was able to meet with patient at bedside.   68 y/o male admitted through [facility] ED with complaints of chest pain and complaints of right second toe pain/swelling. He was seen by Dr [name] (ortho) and is scheduled for amputation of affected toe on 07/21/2019.   Inspection of toe reveals a very red, swollen digit with a small open wound to lateral aspect. Scant amount of dried serosanguineous drainage noted on foot. Foot very warm to touch and painful to palpation. He is unsure how long wound has been there. He does not currently see a podiatrist. Discussed importance of foot/nail care with DM history and he voiced that he may would like a referral to podiatry for care post discharge.  Pertinent medical history to include CAD s/p CABG and stent placement, DM, CKD-Stage 3, PE, HLD, Atrial Fib, HTN, HIV, Hep. C, and cocaine abuse.   Patient reports he recently was in a treatment center in [place] over the last month for cocaine abuse and was discharged home staying with his sister. Prior to that he was living in a motel room. He requests to speak with his social worker regarding help finding an apartment once discharged. Social worker notified.  Denies other questions or barriers at this time. Contact information left with patient should questions or issues arise.   Will follow. Thank you for this consult.  Cletis Media RN BSN CWS [place] [phone]

## 2019-07-20 ENCOUNTER — Other Ambulatory Visit: Payer: Self-pay | Admitting: Family

## 2019-07-20 LAB — CBC
HCT: 28 % — ABNORMAL LOW (ref 39.0–52.0)
Hemoglobin: 9.1 g/dL — ABNORMAL LOW (ref 13.0–17.0)
MCH: 25.8 pg — ABNORMAL LOW (ref 26.0–34.0)
MCHC: 32.5 g/dL (ref 30.0–36.0)
MCV: 79.3 fL — ABNORMAL LOW (ref 80.0–100.0)
Platelets: 172 10*3/uL (ref 150–400)
RBC: 3.53 MIL/uL — ABNORMAL LOW (ref 4.22–5.81)
RDW: 14.1 % (ref 11.5–15.5)
WBC: 3.4 10*3/uL — ABNORMAL LOW (ref 4.0–10.5)
nRBC: 0 % (ref 0.0–0.2)

## 2019-07-20 LAB — BASIC METABOLIC PANEL
Anion gap: 9 (ref 5–15)
BUN: 25 mg/dL — ABNORMAL HIGH (ref 8–23)
CO2: 23 mmol/L (ref 22–32)
Calcium: 8.7 mg/dL — ABNORMAL LOW (ref 8.9–10.3)
Chloride: 105 mmol/L (ref 98–111)
Creatinine, Ser: 1.72 mg/dL — ABNORMAL HIGH (ref 0.61–1.24)
GFR calc Af Amer: 46 mL/min — ABNORMAL LOW (ref >60)
GFR calc non Af Amer: 40 mL/min — ABNORMAL LOW (ref >60)
Glucose, Bld: 227 mg/dL — ABNORMAL HIGH (ref 70–99)
Potassium: 4.2 mmol/L (ref 3.5–5.1)
Sodium: 137 mmol/L (ref 135–145)

## 2019-07-20 LAB — GLUCOSE, CAPILLARY
Glucose-Capillary: 192 mg/dL — ABNORMAL HIGH (ref 70–99)
Glucose-Capillary: 249 mg/dL — ABNORMAL HIGH (ref 70–99)
Glucose-Capillary: 233 mg/dL — ABNORMAL HIGH (ref 70–99)
Glucose-Capillary: 232 mg/dL — ABNORMAL HIGH (ref 70–99)

## 2019-07-20 MED ORDER — CHLORHEXIDINE GLUCONATE 4 % EX LIQD
60.0000 mL | Freq: Once | CUTANEOUS | Status: AC
  Administered 2019-07-21: 4 via TOPICAL
  Filled 2019-07-20: qty 60

## 2019-07-20 MED ORDER — CEFAZOLIN SODIUM-DEXTROSE 2-4 GM/100ML-% IV SOLN
2.0000 g | INTRAVENOUS | Status: AC
  Administered 2019-07-21: 2 g via INTRAVENOUS
  Filled 2019-07-20 (×2): qty 100

## 2019-07-20 NOTE — Care Management Important Message (Signed)
Important Message  Patient Details  Name: Michael [name] MRN: [id number] Date of Birth: 09/17/1951   Medicare Important Message Given:  Yes     Shelda Altes 07/20/2019, 12:57 PM

## 2019-07-20 NOTE — Progress Notes (Signed)
PROGRESS NOTE    Michael [name]  BSJ:[id number] DOB: 1951/09/09 DOA: 07/16/2019 PCP: [name], PA-C   Brief Narrative:  Michael [name] is a 68 y.o. male with medical history significant of hypertension, hyperlipidemia, diabetes mellitus, asthma, GERD, gout, depression, cocaine abuse, PE not on anticoagulants, HIV, CAD, s/po of CABG, s/p of stent placement, atrial fibrillation not on anticoagulants, CKD stage III, iron deficiency anemia, who presented with chest pain.  Per patient, he has been having chest pain intermittently for more than a year since he had his CABG surgery but his pain has worsened in the last 2 days.  This was located at substernal area.  Aggravated with cough and deep breaths.  Patient recently had long distant travel to [place] a month ago.  No other symptoms.  He used cocaine day prior to admission.  Also had swelling and pain in right second toe.  Upon arrival to ED, pt was found to have troponin 102, WBC 6.4, negative COVID-19 test, renal function close to baseline, temperature normal, blood pressure 145/86, heart rate 72, oxygen saturation 99% on room air.  Chest x-ray is negative.  X-ray of right foot showed possible distal second toe osteomyelitis.  Patient was admitted under hospitalist service.  He was ruled out of MI with negative cardiac enzymes.  MRI of right foot confirmed right second toe osteomyelitis and possible abscess.  Patient was seen by Dr. [name] from orthopedics and plan for amputation on Wednesday.  Assessment & Plan:   Principal Problem:   Chest pain Active Problems:   HIV (human immunodeficiency virus infection) ([place])   Hyperlipidemia with target LDL less than 100   3-vessel CAD   Severe recurrent major depressive disorder with psychotic features ([name])   Cocaine use disorder, severe, dependence ([place])   Gout   Essential hypertension   Type II diabetes mellitus with renal manifestations ([name])   GERD (gastroesophageal reflux  disease)   S/P CABG (coronary artery bypass graft)   Chronic kidney disease, stage III (moderate)   Paroxysmal A-fib ([name])   Toe pain, right-second   Osteomyelitis ([name])   Abscess or cellulitis of toe, right  Osteomyelitis of right second toe: MRI confirms osteomyelitis throughout distal phalanx of second toe with some fluid collection surrounding distal phalanx, consistent with an abscess.  Blood cultures have remained negative.  Seen by orthopedics and plan for amputation on Wednesday.  Continue Rocephin and Flagyl in the meantime.  Chest pain with history of CAD/three-vessel disease: Cardiac enzymes negative.  Likely secondary to cocaine use.  No more chest pain.  No EKG changes.  HIV: CD4 count 270 on 09/16/2018.  Continue home medications.  Follow patient.  Hyperlipidemia: Not on any medications.  Cocaine use disorder: Counseling regarding cessation provided.  Essential hypertension: Controlled.  Continue Cardizem.  Continue as needed hydralazine.  Type 2 diabetes mellitus: Blood sugar controlled.  Continue current dose of Lantus 14 units daily and SSI.  Paroxysmal A. Fib: Patient's cha2ds2 vascular 2 score is 4 but he is not on any anticoagulation.  Reason unknown. ?  Noncompliance.  Will discuss with him postoperatively.  Anemia of chronic disease: His baseline seems to be between 9 and 10.  Currently at baseline.  Watch daily.  CKD stage III: At baseline.  Continue to watch.  DVT prophylaxis: Heparin subcu Code Status: Full code Family Communication:  None present at bedside.  Plan of care discussed with patient in length and he verbalized understanding and agreed with it. Disposition Plan:  TBD  Estimated body mass index is 25.1 kg/m as calculated from the following:   Height as of this encounter: 5\' 8"  (1.727 m).   Weight as of this encounter: 74.9 kg.      Nutritional status:  Nutrition Problem: Increased nutrient needs Etiology: wound healing, chronic  illness(osteomyelitis of right toe; HIV)   Signs/Symptoms: estimated needs   Interventions: Ensure Enlive (each supplement provides 350kcal and 20 grams of protein), MVI, Juven    Consultants:   Orthopedics  Procedures:   None  Antimicrobials:   IV Rocephin and Flagyl.   Subjective: Patient seen and examined.  No chest pain but continues to complain of right foot pain.  No new complaint.  Objective: Vitals:   07/19/19 1317 07/19/19 2100 07/20/19 0540 07/20/19 0545  BP: 135/79 131/72 115/70   Pulse: 84 85 78   Resp: 16 18 14    Temp: 98.4 F (36.9 C) 97.7 F (36.5 C) 98.5 F (36.9 C)   TempSrc: Oral Oral Oral   SpO2: 97% 98% 94%   Weight:    74.9 kg  Height:        Intake/Output Summary (Last 24 hours) at 07/20/2019 1205 Last data filed at 07/20/2019 1026 Gross per 24 hour  Intake 1040 ml  Output 1075 ml  Net -35 ml   Filed Weights   07/18/19 0451 07/19/19 0536 07/20/19 0545  Weight: 74.8 kg 76.8 kg 74.9 kg    Examination:  General exam: Appears calm and comfortable  Respiratory system: Clear to auscultation. Respiratory effort normal. Cardiovascular system: S1 & S2 heard, RRR. No JVD, murmurs, rubs, gallops or clicks. No pedal edema. Gastrointestinal system: Abdomen is nondistended, soft and nontender. No organomegaly or masses felt. Normal bowel sounds heard. Central nervous system: Alert and oriented. No focal neurological deficits. Extremities: Symmetric 5 x 5 power.  Has necrotic base of the right second toe.  This is tender.  There is an open ulcer. Skin: No rashes, lesions or ulcers Psychiatry: Judgement and insight appear normal. Mood & affect appropriate.    Data Reviewed: I have personally reviewed following labs and imaging studies  CBC: Recent Labs  Lab 07/16/19 1130 07/17/19 0043 07/18/19 0510 07/19/19 0613 07/20/19 0322  WBC 6.4 4.5 3.4* 4.1 3.4*  HGB 10.4* 8.8* 9.2* 9.4* 9.1*  HCT 31.4* 26.3* 29.1* 29.0* 28.0*  MCV 79.7*  77.8* 80.4 78.8* 79.3*  PLT 167 142* 161 178 841   Basic Metabolic Panel: Recent Labs  Lab 07/16/19 1452 07/18/19 0510 07/20/19 0322  NA 139 141 137  K 3.9 4.0 4.2  CL 104 111 105  CO2 23 20* 23  GLUCOSE 339* 155* 227*  BUN 34* 18 25*  CREATININE 1.70* 1.42* 1.72*  CALCIUM 9.4 8.5* 8.7*   GFR: Estimated Creatinine Clearance: 39.2 mL/min (A) (by C-G formula based on SCr of 1.72 mg/dL (H)). Liver Function Tests: No results for input(s): AST, ALT, ALKPHOS, BILITOT, PROT, ALBUMIN in the last 168 hours. No results for input(s): LIPASE, AMYLASE in the last 168 hours. No results for input(s): AMMONIA in the last 168 hours. Coagulation Profile: No results for input(s): INR, PROTIME in the last 168 hours. Cardiac Enzymes: No results for input(s): CKTOTAL, CKMB, CKMBINDEX, TROPONINI in the last 168 hours. BNP (last 3 results) No results for input(s): PROBNP in the last 8760 hours. HbA1C: No results for input(s): HGBA1C in the last 72 hours. CBG: Recent Labs  Lab 07/19/19 1139 07/19/19 1647 07/19/19 2110 07/20/19 0738 07/20/19 1100  GLUCAP 125*  131* 163* 192* 249*   Lipid Profile: No results for input(s): CHOL, HDL, LDLCALC, TRIG, CHOLHDL, LDLDIRECT in the last 72 hours. Thyroid Function Tests: No results for input(s): TSH, T4TOTAL, FREET4, T3FREE, THYROIDAB in the last 72 hours. Anemia Panel: No results for input(s): VITAMINB12, FOLATE, FERRITIN, TIBC, IRON, RETICCTPCT in the last 72 hours. Sepsis Labs: No results for input(s): PROCALCITON, LATICACIDVEN in the last 168 hours.  Recent Results (from the past 240 hour(s))  SARS CORONAVIRUS 2 (TAT 6-24 HRS) Nasopharyngeal Nasopharyngeal Swab     Status: None   Collection Time: 07/16/19  4:35 PM   Specimen: Nasopharyngeal Swab  Result Value Ref Range Status   SARS Coronavirus 2 NEGATIVE NEGATIVE Final    Comment: (NOTE) SARS-CoV-2 target nucleic acids are NOT DETECTED. The SARS-CoV-2 RNA is generally detectable in upper  and lower respiratory specimens during the acute phase of infection. Negative results do not preclude SARS-CoV-2 infection, do not rule out co-infections with other pathogens, and should not be used as the sole basis for treatment or other patient management decisions. Negative results must be combined with clinical observations, patient history, and epidemiological information. The expected result is Negative. Fact Sheet for Patients: SugarRoll.be Fact Sheet for Healthcare Providers: https://www.woods-mathews.com/ This test is not yet approved or cleared by the [place] FDA and  has been authorized for detection and/or diagnosis of SARS-CoV-2 by FDA under an Emergency Use Authorization (EUA). This EUA will remain  in effect (meaning this test can be used) for the duration of the COVID-19 declaration under Section 56 4(b)(1) of the Act, 21 U.S.C. section 360bbb-3(b)(1), unless the authorization is terminated or revoked sooner. Performed at [facility], [place] [address] [place], [place] [address]   Culture, blood (Routine X 2) w Reflex to ID Panel     Status: None (Preliminary result)   Collection Time: 07/17/19 12:43 AM   Specimen: BLOOD  Result Value Ref Range Status   Specimen Description BLOOD RIGHT ARM  Final   Special Requests   Final    BOTTLES DRAWN AEROBIC AND ANAEROBIC Blood Culture adequate volume   Culture   Final    NO GROWTH 2 DAYS Performed at [facility], [place] [address] [place], [place] [address]    Report Status PENDING  Incomplete  Culture, blood (Routine X 2) w Reflex to ID Panel     Status: None (Preliminary result)   Collection Time: 07/17/19  1:00 AM   Specimen: BLOOD  Result Value Ref Range Status   Specimen Description BLOOD LEFT HAND  Final   Special Requests   Final    BOTTLES DRAWN AEROBIC AND ANAEROBIC Blood Culture adequate volume   Culture   Final    NO GROWTH 2 DAYS Performed at [facility], [place] [address], [place], [place] [address]    Report Status PENDING  Incomplete  Surgical PCR screen     Status: None   Collection Time: 07/19/19  4:30 PM   Specimen: Nasal Mucosa; Nasal Swab  Result Value Ref Range Status   MRSA, PCR NEGATIVE NEGATIVE Final   Staphylococcus aureus NEGATIVE NEGATIVE Final    Comment: (NOTE) The Xpert SA Assay (FDA approved for NASAL specimens in patients 21 years of age and older), is one component of a comprehensive surveillance program. It is not intended to diagnose infection nor to guide or monitor treatment. Performed at [facility], [place] [address] [place], [place] [address]  Radiology Studies: Vas [place] Abi With/wo Tbi  Result Date: 07/19/2019 LOWER EXTREMITY DOPPLER STUDY Indications: Ulceration, and Gout and abscess and osteomyelitis of right second              toe. High Risk         Hypertension, hyperlipidemia, Diabetes, coronary artery Factors:          disease. Other Factors: Crack cocaine abuse, HIV,.  Comparison Study: No prior study on file for comparison Performing Technologist: Sharion Dove RVS  Examination Guidelines: A complete evaluation includes at minimum, Doppler waveform signals and systolic blood pressure reading at the level of bilateral brachial, anterior tibial, and posterior tibial arteries, when vessel segments are accessible. Bilateral testing is considered an integral part of a complete examination. Photoelectric Plethysmograph (PPG) waveforms and toe systolic pressure readings are included as required and additional duplex testing as needed. Limited examinations for reoccurring indications may be performed as noted.  ABI Findings: +---------+------------------+-----+---------+--------+ Right    Rt Pressure (mmHg)IndexWaveform Comment  +---------+------------------+-----+---------+--------+ Brachial 139                    triphasic          +---------+------------------+-----+---------+--------+ PTA      163               1.17 biphasic          +---------+------------------+-----+---------+--------+ DP       164               1.18 biphasic          +---------+------------------+-----+---------+--------+ Great Toe80                0.58                   +---------+------------------+-----+---------+--------+ +---------+------------------+-----+---------+-------+ Left     Lt Pressure (mmHg)IndexWaveform Comment +---------+------------------+-----+---------+-------+ Brachial 136                    triphasic        +---------+------------------+-----+---------+-------+ PTA      130               0.94 biphasic         +---------+------------------+-----+---------+-------+ DP       136               0.98 biphasic         +---------+------------------+-----+---------+-------+ Great Toe88                0.63                  +---------+------------------+-----+---------+-------+ +-------+-----------+-----------+------------+------------+ ABI/TBIToday's ABIToday's TBIPrevious ABIPrevious TBI +-------+-----------+-----------+------------+------------+ Right  1.18       0.58                                +-------+-----------+-----------+------------+------------+ Left   0.98       0.63                                +-------+-----------+-----------+------------+------------+  Summary: Right: Resting right ankle-brachial index is within normal range. No evidence of significant right lower extremity arterial disease. The right toe-brachial index is abnormal. Left: Resting left ankle-brachial index is within normal range. No evidence of significant left lower extremity arterial disease. The left toe-brachial index is abnormal.  *See table(s) above for  measurements and observations.  Electronically signed by Monica Martinez MD on 07/19/2019 at 7:10:13 PM.    Final     Scheduled Meds: .  allopurinol  300 mg Oral Daily  . aspirin  81 mg Oral Daily  . colchicine  0.6 mg Oral Daily  . diltiazem  120 mg Oral Daily  . emtricitabine-rilpivir-tenofovir AF  1 tablet Oral Q breakfast  . famotidine  20 mg Oral Daily  . feeding supplement (ENSURE ENLIVE)  237 mL Oral BID BM  . ferrous sulfate  325 mg Oral BID WC  . folic acid  1 mg Oral Daily  . gabapentin  400 mg Oral BID  . heparin injection (subcutaneous)  5,000 Units Subcutaneous Q8H  . insulin aspart  0-9 Units Subcutaneous TID WC  . insulin glargine  14 Units Subcutaneous QHS  . multivitamin  1 tablet Oral Daily  . mupirocin ointment  1 application Nasal BID  . nutrition supplement (JUVEN)  1 packet Oral BID BM  . risperiDONE  0.25 mg Oral BID  . sertraline  50 mg Oral Daily  . sodium chloride flush  3 mL Intravenous Once  . thiamine  100 mg Oral Daily   Continuous Infusions: . cefTRIAXone (ROCEPHIN)  IV 2 g (07/19/19 1231)   And  . metronidazole 500 mg (07/20/19 0537)     LOS: 3 days   Time spent: 35 minutes   Darliss Cheney, MD [facility]  07/20/2019, 12:05 PM   To contact the attending provider between 7A-7P or the covering provider during after hours 7P-7A, please log into the web site [email or web address] and use password [email or web address].

## 2019-07-21 ENCOUNTER — Encounter (HOSPITAL_COMMUNITY)
Admission: EM | Disposition: A | Discharge status: Home / Self Care | Payer: Self-pay | Source: Home / Self Care | Attending: Family Medicine

## 2019-07-21 ENCOUNTER — Encounter (HOSPITAL_COMMUNITY): Payer: Self-pay | Admitting: *Deleted

## 2019-07-21 ENCOUNTER — Inpatient Hospital Stay (HOSPITAL_COMMUNITY): Payer: Medicare Other | Admitting: Anesthesiology

## 2019-07-21 DIAGNOSIS — L02611 Cutaneous abscess of right foot: Secondary | ICD-10-CM | POA: Diagnosis not present

## 2019-07-21 HISTORY — PX: AMPUTATION: SHX166

## 2019-07-21 LAB — COMPREHENSIVE METABOLIC PANEL
ALT: 23 U/L (ref 0–44)
AST: 40 U/L (ref 15–41)
Albumin: 2.5 g/dL — ABNORMAL LOW (ref 3.5–5.0)
Alkaline Phosphatase: 50 U/L (ref 38–126)
Anion gap: 7 (ref 5–15)
BUN: 30 mg/dL — ABNORMAL HIGH (ref 8–23)
CO2: 24 mmol/L (ref 22–32)
Calcium: 8.7 mg/dL — ABNORMAL LOW (ref 8.9–10.3)
Chloride: 105 mmol/L (ref 98–111)
Creatinine, Ser: 1.66 mg/dL — ABNORMAL HIGH (ref 0.61–1.24)
GFR calc Af Amer: 48 mL/min — ABNORMAL LOW (ref >60)
GFR calc non Af Amer: 41 mL/min — ABNORMAL LOW (ref >60)
Glucose, Bld: 285 mg/dL — ABNORMAL HIGH (ref 70–99)
Potassium: 4.5 mmol/L (ref 3.5–5.1)
Sodium: 136 mmol/L (ref 135–145)
Total Bilirubin: 0.5 mg/dL (ref 0.3–1.2)
Total Protein: 6.1 g/dL — ABNORMAL LOW (ref 6.5–8.1)

## 2019-07-21 LAB — CBC WITH DIFFERENTIAL/PLATELET
Abs Immature Granulocytes: 0.03 10*3/uL (ref 0.00–0.07)
Basophils Absolute: 0 10*3/uL (ref 0.0–0.1)
Basophils Relative: 1 %
Eosinophils Absolute: 0.1 10*3/uL (ref 0.0–0.5)
Eosinophils Relative: 3 %
HCT: 26.2 % — ABNORMAL LOW (ref 39.0–52.0)
Hemoglobin: 8.3 g/dL — ABNORMAL LOW (ref 13.0–17.0)
Immature Granulocytes: 1 %
Lymphocytes Relative: 30 %
Lymphs Abs: 1 10*3/uL (ref 0.7–4.0)
MCH: 25.3 pg — ABNORMAL LOW (ref 26.0–34.0)
MCHC: 31.7 g/dL (ref 30.0–36.0)
MCV: 79.9 fL — ABNORMAL LOW (ref 80.0–100.0)
Monocytes Absolute: 0.4 10*3/uL (ref 0.1–1.0)
Monocytes Relative: 12 %
Neutro Abs: 1.8 10*3/uL (ref 1.7–7.7)
Neutrophils Relative %: 53 %
Platelets: 170 10*3/uL (ref 150–400)
RBC: 3.28 MIL/uL — ABNORMAL LOW (ref 4.22–5.81)
RDW: 13.9 % (ref 11.5–15.5)
WBC: 3.4 10*3/uL — ABNORMAL LOW (ref 4.0–10.5)
nRBC: 0 % (ref 0.0–0.2)

## 2019-07-21 LAB — GLUCOSE, CAPILLARY
Glucose-Capillary: 177 mg/dL — ABNORMAL HIGH (ref 70–99)
Glucose-Capillary: 148 mg/dL — ABNORMAL HIGH (ref 70–99)
Glucose-Capillary: 161 mg/dL — ABNORMAL HIGH (ref 70–99)
Glucose-Capillary: 216 mg/dL — ABNORMAL HIGH (ref 70–99)
Glucose-Capillary: 370 mg/dL — ABNORMAL HIGH (ref 70–99)
Glucose-Capillary: 371 mg/dL — ABNORMAL HIGH (ref 70–99)
Glucose-Capillary: 335 mg/dL — ABNORMAL HIGH (ref 70–99)

## 2019-07-21 SURGERY — AMPUTATION DIGIT
Anesthesia: General | Laterality: Right

## 2019-07-21 MED ORDER — DEXAMETHASONE SODIUM PHOSPHATE 10 MG/ML IJ SOLN
INTRAMUSCULAR | Status: AC
  Filled 2019-07-21: qty 1

## 2019-07-21 MED ORDER — ONDANSETRON HCL 4 MG/2ML IJ SOLN: 4.0000 mg | Freq: Four times a day (QID) | INTRAMUSCULAR | Status: DC | PRN

## 2019-07-21 MED ORDER — PHENYLEPHRINE 40 MCG/ML (10ML) SYRINGE FOR IV PUSH (FOR BLOOD PRESSURE SUPPORT)
PREFILLED_SYRINGE | INTRAVENOUS | Status: DC | PRN
  Administered 2019-07-21 (×2): 120 ug via INTRAVENOUS
  Administered 2019-07-21: 160 ug via INTRAVENOUS

## 2019-07-21 MED ORDER — LACTATED RINGERS IV SOLN
INTRAVENOUS | Status: DC
  Administered 2019-07-21: 10:00:00 via INTRAVENOUS

## 2019-07-21 MED ORDER — ONDANSETRON HCL 4 MG/2ML IJ SOLN
INTRAMUSCULAR | Status: DC | PRN
  Administered 2019-07-21: 4 mg via INTRAVENOUS

## 2019-07-21 MED ORDER — LIDOCAINE 2% (20 MG/ML) 5 ML SYRINGE
INTRAMUSCULAR | Status: DC | PRN
  Administered 2019-07-21: 80 mg via INTRAVENOUS

## 2019-07-21 MED ORDER — ACETAMINOPHEN 500 MG PO TABS
1000.0000 mg | ORAL_TABLET | Freq: Once | ORAL | Status: AC
  Administered 2019-07-21: 1000 mg via ORAL
  Filled 2019-07-21: qty 2

## 2019-07-21 MED ORDER — ENSURE PRE-SURGERY PO LIQD
296.0000 mL | Freq: Once | ORAL | Status: DC
  Filled 2019-07-21: qty 296

## 2019-07-21 MED ORDER — METHOCARBAMOL 500 MG PO TABS
500.0000 mg | ORAL_TABLET | Freq: Four times a day (QID) | ORAL | Status: DC | PRN
  Administered 2019-07-21: 500 mg via ORAL
  Filled 2019-07-21: qty 1

## 2019-07-21 MED ORDER — OXYCODONE HCL 5 MG PO TABS
5.0000 mg | ORAL_TABLET | ORAL | Status: DC | PRN
  Administered 2019-07-21: 10 mg via ORAL
  Administered 2019-07-23: 5 mg via ORAL
  Administered 2019-07-30 – 2019-08-01 (×3): 10 mg via ORAL
  Filled 2019-07-21: qty 2
  Filled 2019-07-21: qty 1
  Filled 2019-07-21 (×5): qty 2

## 2019-07-21 MED ORDER — FENTANYL CITRATE (PF) 100 MCG/2ML IJ SOLN
25.0000 ug | INTRAMUSCULAR | Status: DC | PRN
  Administered 2019-07-21: 50 ug via INTRAVENOUS
  Administered 2019-07-21 (×2): 25 ug via INTRAVENOUS

## 2019-07-21 MED ORDER — ONDANSETRON HCL 4 MG PO TABS: 4.0000 mg | ORAL_TABLET | Freq: Four times a day (QID) | ORAL | Status: DC | PRN

## 2019-07-21 MED ORDER — ONDANSETRON HCL 4 MG/2ML IJ SOLN: 4.0000 mg | Freq: Once | INTRAMUSCULAR | Status: DC | PRN

## 2019-07-21 MED ORDER — BISACODYL 10 MG RE SUPP
10.0000 mg | Freq: Every day | RECTAL | Status: DC | PRN
  Administered 2019-07-24: 10 mg via RECTAL
  Filled 2019-07-21: qty 1

## 2019-07-21 MED ORDER — FENTANYL CITRATE (PF) 250 MCG/5ML IJ SOLN
INTRAMUSCULAR | Status: AC
  Filled 2019-07-21: qty 5

## 2019-07-21 MED ORDER — MAGNESIUM CITRATE PO SOLN
1.0000 | Freq: Once | ORAL | Status: AC | PRN
  Administered 2019-07-31: 1 via ORAL
  Filled 2019-07-21: qty 296

## 2019-07-21 MED ORDER — METOCLOPRAMIDE HCL 5 MG PO TABS: 5.0000 mg | ORAL_TABLET | Freq: Three times a day (TID) | ORAL | Status: DC | PRN

## 2019-07-21 MED ORDER — DEXAMETHASONE SODIUM PHOSPHATE 4 MG/ML IJ SOLN
INTRAMUSCULAR | Status: DC | PRN
  Administered 2019-07-21: 10 mg via INTRAVENOUS

## 2019-07-21 MED ORDER — POLYETHYLENE GLYCOL 3350 17 G PO PACK: 17.0000 g | PACK | Freq: Every day | ORAL | Status: DC | PRN

## 2019-07-21 MED ORDER — ONDANSETRON HCL 4 MG/2ML IJ SOLN
INTRAMUSCULAR | Status: AC
  Filled 2019-07-21: qty 2

## 2019-07-21 MED ORDER — MIDAZOLAM HCL 5 MG/5ML IJ SOLN
INTRAMUSCULAR | Status: DC | PRN
  Administered 2019-07-21: 2 mg via INTRAVENOUS

## 2019-07-21 MED ORDER — PHENYLEPHRINE 40 MCG/ML (10ML) SYRINGE FOR IV PUSH (FOR BLOOD PRESSURE SUPPORT)
PREFILLED_SYRINGE | INTRAVENOUS | Status: AC
  Filled 2019-07-21: qty 30

## 2019-07-21 MED ORDER — METHOCARBAMOL 1000 MG/10ML IJ SOLN
500.0000 mg | Freq: Four times a day (QID) | INTRAVENOUS | Status: DC | PRN
  Filled 2019-07-21: qty 5

## 2019-07-21 MED ORDER — 0.9 % SODIUM CHLORIDE (POUR BTL) OPTIME
TOPICAL | Status: DC | PRN
  Administered 2019-07-21: 1000 mL

## 2019-07-21 MED ORDER — FENTANYL CITRATE (PF) 100 MCG/2ML IJ SOLN
INTRAMUSCULAR | Status: AC
  Administered 2019-07-21: 25 ug via INTRAVENOUS
  Filled 2019-07-21: qty 2

## 2019-07-21 MED ORDER — DOCUSATE SODIUM 100 MG PO CAPS
100.0000 mg | ORAL_CAPSULE | Freq: Two times a day (BID) | ORAL | Status: DC
  Administered 2019-07-21 – 2019-08-04 (×26): 100 mg via ORAL
  Filled 2019-07-21 (×29): qty 1

## 2019-07-21 MED ORDER — PROPOFOL 10 MG/ML IV BOLUS
INTRAVENOUS | Status: AC
  Filled 2019-07-21: qty 20

## 2019-07-21 MED ORDER — SUCCINYLCHOLINE CHLORIDE 200 MG/10ML IV SOSY
PREFILLED_SYRINGE | INTRAVENOUS | Status: AC
  Filled 2019-07-21: qty 10

## 2019-07-21 MED ORDER — EPHEDRINE SULFATE-NACL 50-0.9 MG/10ML-% IV SOSY
PREFILLED_SYRINGE | INTRAVENOUS | Status: DC | PRN
  Administered 2019-07-21: 15 mg via INTRAVENOUS
  Administered 2019-07-21 (×3): 10 mg via INTRAVENOUS

## 2019-07-21 MED ORDER — MIDAZOLAM HCL 2 MG/2ML IJ SOLN
INTRAMUSCULAR | Status: AC
  Filled 2019-07-21: qty 2

## 2019-07-21 MED ORDER — METOCLOPRAMIDE HCL 5 MG/ML IJ SOLN: 5.0000 mg | Freq: Three times a day (TID) | INTRAMUSCULAR | Status: DC | PRN

## 2019-07-21 MED ORDER — ACETAMINOPHEN 325 MG PO TABS
325.0000 mg | ORAL_TABLET | Freq: Four times a day (QID) | ORAL | Status: DC | PRN
  Administered 2019-07-26 – 2019-07-30 (×3): 325 mg via ORAL
  Filled 2019-07-21 (×3): qty 1

## 2019-07-21 MED ORDER — HYDROMORPHONE HCL 1 MG/ML IJ SOLN
0.5000 mg | INTRAMUSCULAR | Status: DC | PRN
  Administered 2019-07-21 – 2019-07-22 (×2): 1 mg via INTRAVENOUS
  Filled 2019-07-21 (×2): qty 1

## 2019-07-21 MED ORDER — SODIUM CHLORIDE 0.9 % IV SOLN: INTRAVENOUS | Status: DC

## 2019-07-21 MED ORDER — LIDOCAINE 2% (20 MG/ML) 5 ML SYRINGE
INTRAMUSCULAR | Status: AC
  Filled 2019-07-21: qty 15

## 2019-07-21 MED ORDER — OXYCODONE HCL 5 MG PO TABS
10.0000 mg | ORAL_TABLET | ORAL | Status: DC | PRN
  Administered 2019-07-21: 10 mg via ORAL
  Administered 2019-07-22 (×2): 15 mg via ORAL
  Administered 2019-07-22: 10 mg via ORAL
  Administered 2019-07-22: 15 mg via ORAL
  Administered 2019-07-23 (×2): 10 mg via ORAL
  Administered 2019-07-23: 15 mg via ORAL
  Administered 2019-07-24 – 2019-07-26 (×4): 10 mg via ORAL
  Administered 2019-07-26: 15 mg via ORAL
  Administered 2019-07-27 – 2019-07-29 (×4): 10 mg via ORAL
  Administered 2019-07-31 – 2019-08-02 (×7): 15 mg via ORAL
  Administered 2019-08-03: 10 mg via ORAL
  Administered 2019-08-03 (×2): 15 mg via ORAL
  Filled 2019-07-21: qty 2
  Filled 2019-07-21: qty 3
  Filled 2019-07-21: qty 2
  Filled 2019-07-21 (×2): qty 3
  Filled 2019-07-21 (×2): qty 2
  Filled 2019-07-21: qty 3
  Filled 2019-07-21 (×2): qty 2
  Filled 2019-07-21: qty 3
  Filled 2019-07-21: qty 2
  Filled 2019-07-21: qty 3
  Filled 2019-07-21: qty 2
  Filled 2019-07-21: qty 3
  Filled 2019-07-21: qty 2
  Filled 2019-07-21 (×7): qty 3
  Filled 2019-07-21 (×2): qty 2

## 2019-07-21 MED ORDER — PROPOFOL 10 MG/ML IV BOLUS
INTRAVENOUS | Status: DC | PRN
  Administered 2019-07-21: 60 mg via INTRAVENOUS
  Administered 2019-07-21: 140 mg via INTRAVENOUS

## 2019-07-21 SURGICAL SUPPLY — 29 items
BLADE SURG 21 STRL SS (BLADE) ×2 IMPLANT
BNDG COHESIVE 4X5 TAN STRL (GAUZE/BANDAGES/DRESSINGS) ×2 IMPLANT
BNDG COHESIVE 6X5 TAN STRL LF (GAUZE/BANDAGES/DRESSINGS) ×1 IMPLANT
BNDG ESMARK 4X9 LF (GAUZE/BANDAGES/DRESSINGS) IMPLANT
BNDG GAUZE ELAST 4 BULKY (GAUZE/BANDAGES/DRESSINGS) ×2 IMPLANT
COVER SURGICAL LIGHT HANDLE (MISCELLANEOUS) ×4 IMPLANT
COVER WAND RF STERILE (DRAPES) ×2 IMPLANT
DRAPE U-SHAPE 47X51 STRL (DRAPES) ×2 IMPLANT
DRSG ADAPTIC 3X8 NADH LF (GAUZE/BANDAGES/DRESSINGS) ×1 IMPLANT
DRSG PAD ABDOMINAL 8X10 ST (GAUZE/BANDAGES/DRESSINGS) ×2 IMPLANT
DURAPREP 26ML APPLICATOR (WOUND CARE) ×2 IMPLANT
ELECT REM PT RETURN 9FT ADLT (ELECTROSURGICAL) ×2
ELECTRODE REM PT RTRN 9FT ADLT (ELECTROSURGICAL) ×1 IMPLANT
GAUZE SPONGE 4X4 12PLY STRL (GAUZE/BANDAGES/DRESSINGS) ×1 IMPLANT
GLOVE BIOGEL PI IND STRL 9 (GLOVE) ×1 IMPLANT
GLOVE BIOGEL PI INDICATOR 9 (GLOVE) ×1
GLOVE SURG ORTHO 9.0 STRL STRW (GLOVE) ×2 IMPLANT
GOWN STRL REUS W/ TWL XL LVL3 (GOWN DISPOSABLE) ×2 IMPLANT
GOWN STRL REUS W/TWL XL LVL3 (GOWN DISPOSABLE) ×2
KIT BASIN OR (CUSTOM PROCEDURE TRAY) ×2 IMPLANT
KIT TURNOVER KIT B (KITS) ×2 IMPLANT
MANIFOLD NEPTUNE II (INSTRUMENTS) ×2 IMPLANT
NEEDLE 22X1 1/2 (OR ONLY) (NEEDLE) IMPLANT
NS IRRIG 1000ML POUR BTL (IV SOLUTION) ×2 IMPLANT
PACK ORTHO EXTREMITY (CUSTOM PROCEDURE TRAY) ×2 IMPLANT
PAD ARMBOARD 7.5X6 YLW CONV (MISCELLANEOUS) ×4 IMPLANT
SUT ETHILON 2 0 PSLX (SUTURE) ×2 IMPLANT
SYR CONTROL 10ML LL (SYRINGE) IMPLANT
TOWEL GREEN STERILE (TOWEL DISPOSABLE) ×2 IMPLANT

## 2019-07-21 NOTE — Interval H&P Note (Signed)
History and Physical Interval Note:  07/21/2019 7:21 AM  Michael [name]  has presented today for surgery, with the diagnosis of Osteomyelitis Right 2nd Toe.  The various methods of treatment have been discussed with the patient and family. After consideration of risks, benefits and other options for treatment, the patient has consented to  Procedure(s): RIGHT 2ND TOE AMPUTATION (Right) as a surgical intervention.  The patient's history has been reviewed, patient examined, no change in status, stable for surgery.  I have reviewed the patient's chart and labs.  Questions were answered to the patient's satisfaction.     [name]

## 2019-07-21 NOTE — Progress Notes (Signed)
Inpatient Diabetes Program Recommendations  AACE/ADA: New Consensus Statement on Inpatient Glycemic Control (2015)  Target Ranges:  Prepandial:   less than 140 mg/dL      Peak postprandial:   less than 180 mg/dL (1-2 hours)      Critically ill patients:  140 - 180 mg/dL   Lab Results  Component Value Date   GLUCAP 177 (H) 07/21/2019   HGBA1C 9.7 (H) 07/17/2019    Review of Glycemic Control Results for Michael [name], Michael [name] (MRN [id number]) as of 07/21/2019 09:43  Ref. Range 07/20/2019 11:00 07/20/2019 15:56 07/20/2019 21:03 07/21/2019 08:13  Glucose-Capillary Latest Ref Range: 70 - 99 mg/dL 249 (H) 233 (H) 232 (H) 177 (H)   Diabetes history: DM 2 Outpatient Diabetes medications: Lantus 20 units qhs, Humalog 4-6 units Daily Current orders for Inpatient glycemic control:  Lantus 14 units qhs Novolog 0-9 units tid  Inpatient Diabetes Program Recommendations:    If blood sugars continue to exceed 180 mg/dL, consider adding Novolog 3 units TID (assuming patient is consuming >50% of meals).   Thanks, Bronson Curb, MSN, RNC-OB Diabetes Coordinator [phone] (8a-5p)

## 2019-07-21 NOTE — Anesthesia Procedure Notes (Signed)
Procedure Name: LMA Insertion Date/Time: 07/21/2019 11:13 AM Performed by: Candis Shine, CRNA Pre-anesthesia Checklist: Patient identified, Emergency Drugs available, Suction available and Patient being monitored Patient Re-evaluated:Patient Re-evaluated prior to induction Oxygen Delivery Method: Circle System Utilized Preoxygenation: Pre-oxygenation with 100% oxygen Induction Type: IV induction LMA: LMA inserted LMA Size: 5.0 Number of attempts: 1 Placement Confirmation: positive ETCO2 Tube secured with: Tape Dental Injury: Teeth and Oropharynx as per pre-operative assessment

## 2019-07-21 NOTE — Progress Notes (Signed)
PROGRESS NOTE    Michael [name]  XFG:[id number] DOB: 20-Dec-1950 DOA: 07/16/2019 PCP: [name], PA-C   Brief Narrative:  Michael [name] is a 68 y.o. male with medical history significant of hypertension, hyperlipidemia, diabetes mellitus, asthma, GERD, gout, depression, cocaine abuse, PE not on anticoagulants, HIV, CAD, s/po of CABG, s/p of stent placement, atrial fibrillation not on anticoagulants, CKD stage III, iron deficiency anemia, who presented with chest pain.  Per patient, he has been having chest pain intermittently for more than a year since he had his CABG surgery but his pain has worsened in the last 2 days.  This was located at substernal area.  Aggravated with cough and deep breaths.  Patient recently had long distant travel to [place] a month ago.  No other symptoms.  He used cocaine day prior to admission.  Also had swelling and pain in right second toe.  Upon arrival to ED, pt was found to have troponin 102, WBC 6.4, negative COVID-19 test, renal function close to baseline, temperature normal, blood pressure 145/86, heart rate 72, oxygen saturation 99% on room air.  Chest x-ray is negative.  X-ray of right foot showed possible distal second toe osteomyelitis.  Patient was admitted under hospitalist service.  He was ruled out of MI with negative cardiac enzymes.  MRI of right foot confirmed right second toe osteomyelitis and possible abscess.  Patient was seen by Dr. [name] from orthopedics and plan for amputation on Wednesday.  Assessment & Plan:   Principal Problem:   Chest pain Active Problems:   HIV (human immunodeficiency virus infection) ([place])   Hyperlipidemia with target LDL less than 100   3-vessel CAD   Severe recurrent major depressive disorder with psychotic features ([name])   Cocaine use disorder, severe, dependence ([place])   Gout   Essential hypertension   Type II diabetes mellitus with renal manifestations ([name])   GERD (gastroesophageal reflux  disease)   S/P CABG (coronary artery bypass graft)   Chronic kidney disease, stage III (moderate)   Paroxysmal A-fib ([name])   Toe pain, right-second   Osteomyelitis ([name])   Abscess or cellulitis of toe, right  Osteomyelitis of right second toe: MRI confirms osteomyelitis throughout distal phalanx of second toe with some fluid collection surrounding distal phalanx, consistent with an abscess.  Blood cultures have remained negative.  Seen by orthopedics and plan for amputation today.  Continue Rocephin and Flagyl in the meantime.  Chest pain with history of CAD/three-vessel disease: Cardiac enzymes negative.  Likely secondary to cocaine use.  No more chest pain.  No EKG changes.  HIV: CD4 count 270 on 09/16/2018.  Continue home medications.  Follow patient.  Hyperlipidemia: Not on any medications.  Cocaine use disorder: Counseling regarding cessation provided.  Essential hypertension: Controlled.  Continue Cardizem.  Continue as needed hydralazine.  Type 2 diabetes mellitus: Blood sugar controlled.  Continue current dose of Lantus 14 units daily and SSI.  Paroxysmal A. Fib: Patient's cha2ds2 vascular 2 score is 4 but he is not on any anticoagulation.  Reason unknown. ?  Noncompliance.  Will discuss with him postoperatively.  Anemia of chronic disease: His baseline seems to be between 9 and 10.  Currently at baseline.  Watch daily.  CKD stage III: At baseline.  Continue to watch.  DVT prophylaxis: Heparin subcu Code Status: Full code Family Communication:  None present at bedside.  Plan of care discussed with patient in length and he verbalized understanding and agreed with it. Disposition Plan: TBD  Estimated body mass index is 25.38 kg/m as calculated from the following:   Height as of this encounter: 5\' 8"  (1.727 m).   Weight as of this encounter: 75.7 kg.      Nutritional status:  Nutrition Problem: Increased nutrient needs Etiology: wound healing, chronic  illness(osteomyelitis of right toe; HIV)   Signs/Symptoms: estimated needs   Interventions: Ensure Enlive (each supplement provides 350kcal and 20 grams of protein), MVI, Juven    Consultants:   Orthopedics  Procedures:   None  Antimicrobials:   IV Rocephin and Flagyl.   Subjective: Patient seen and examined.  Continues to complain of left second toe pain.  No other complaint.  Objective: Vitals:   07/20/19 0545 07/20/19 1500 07/20/19 2050 07/21/19 0500  BP:  121/72 139/80 (!) 157/96  Pulse:  75 80 67  Resp:  (!) 21 16 14   Temp:  98.4 F (36.9 C) 97.8 F (36.6 C) 97.6 F (36.4 C)  TempSrc:  Oral Oral Oral  SpO2:  99% 96% 97%  Weight: 74.9 kg   75.7 kg  Height:        Intake/Output Summary (Last 24 hours) at 07/21/2019 0828 Last data filed at 07/21/2019 0500 Gross per 24 hour  Intake 480 ml  Output 1625 ml  Net -1145 ml   Filed Weights   07/19/19 0536 07/20/19 0545 07/21/19 0500  Weight: 76.8 kg 74.9 kg 75.7 kg    Examination:  General exam: Appears calm and comfortable  Respiratory system: Clear to auscultation. Respiratory effort normal. Cardiovascular system: S1 & S2 heard, RRR. No JVD, murmurs, rubs, gallops or clicks. No pedal edema. Gastrointestinal system: Abdomen is nondistended, soft and nontender. No organomegaly or masses felt. Normal bowel sounds heard. Central nervous system: Alert and oriented. No focal neurological deficits. Extremities: Symmetric 5 x 5 power.  Necrotic base of the right second toe which is tender to palpation.  Open sore which is nonbleeding. Skin: No rashes, lesions or ulcers.  Psychiatry: Judgement and insight appear poor. Mood & affect appropriate.    Data Reviewed: I have personally reviewed following labs and imaging studies  CBC: Recent Labs  Lab 07/17/19 0043 07/18/19 0510 07/19/19 0613 07/20/19 0322 07/21/19 0316  WBC 4.5 3.4* 4.1 3.4* 3.4*  NEUTROABS  --   --   --   --  1.8  HGB 8.8* 9.2* 9.4* 9.1*  8.3*  HCT 26.3* 29.1* 29.0* 28.0* 26.2*  MCV 77.8* 80.4 78.8* 79.3* 79.9*  PLT 142* 161 178 172 270   Basic Metabolic Panel: Recent Labs  Lab 07/16/19 1452 07/18/19 0510 07/20/19 0322 07/21/19 0316  NA 139 141 137 136  K 3.9 4.0 4.2 4.5  CL 104 111 105 105  CO2 23 20* 23 24  GLUCOSE 339* 155* 227* 285*  BUN 34* 18 25* 30*  CREATININE 1.70* 1.42* 1.72* 1.66*  CALCIUM 9.4 8.5* 8.7* 8.7*   GFR: Estimated Creatinine Clearance: 40.6 mL/min (A) (by C-G formula based on SCr of 1.66 mg/dL (H)). Liver Function Tests: Recent Labs  Lab 07/21/19 0316  AST 40  ALT 23  ALKPHOS 50  BILITOT 0.5  PROT 6.1*  ALBUMIN 2.5*   No results for input(s): LIPASE, AMYLASE in the last 168 hours. No results for input(s): AMMONIA in the last 168 hours. Coagulation Profile: No results for input(s): INR, PROTIME in the last 168 hours. Cardiac Enzymes: No results for input(s): CKTOTAL, CKMB, CKMBINDEX, TROPONINI in the last 168 hours. BNP (last 3 results) No results for input(s):  PROBNP in the last 8760 hours. HbA1C: No results for input(s): HGBA1C in the last 72 hours. CBG: Recent Labs  Lab 07/20/19 0738 07/20/19 1100 07/20/19 1556 07/20/19 2103 07/21/19 0813  GLUCAP 192* 249* 233* 232* 177*   Lipid Profile: No results for input(s): CHOL, HDL, LDLCALC, TRIG, CHOLHDL, LDLDIRECT in the last 72 hours. Thyroid Function Tests: No results for input(s): TSH, T4TOTAL, FREET4, T3FREE, THYROIDAB in the last 72 hours. Anemia Panel: No results for input(s): VITAMINB12, FOLATE, FERRITIN, TIBC, IRON, RETICCTPCT in the last 72 hours. Sepsis Labs: No results for input(s): PROCALCITON, LATICACIDVEN in the last 168 hours.  Recent Results (from the past 240 hour(s))  SARS CORONAVIRUS 2 (TAT 6-24 HRS) Nasopharyngeal Nasopharyngeal Swab     Status: None   Collection Time: 07/16/19  4:35 PM   Specimen: Nasopharyngeal Swab  Result Value Ref Range Status   SARS Coronavirus 2 NEGATIVE NEGATIVE Final     Comment: (NOTE) SARS-CoV-2 target nucleic acids are NOT DETECTED. The SARS-CoV-2 RNA is generally detectable in upper and lower respiratory specimens during the acute phase of infection. Negative results do not preclude SARS-CoV-2 infection, do not rule out co-infections with other pathogens, and should not be used as the sole basis for treatment or other patient management decisions. Negative results must be combined with clinical observations, patient history, and epidemiological information. The expected result is Negative. Fact Sheet for Patients: SugarRoll.be Fact Sheet for Healthcare Providers: https://www.woods-mathews.com/ This test is not yet approved or cleared by the [place] FDA and  has been authorized for detection and/or diagnosis of SARS-CoV-2 by FDA under an Emergency Use Authorization (EUA). This EUA will remain  in effect (meaning this test can be used) for the duration of the COVID-19 declaration under Section 56 4(b)(1) of the Act, 21 U.S.C. section 360bbb-3(b)(1), unless the authorization is terminated or revoked sooner. Performed at [facility], [place] [address], [place], [place] [address]   Culture, blood (Routine X 2) w Reflex to ID Panel     Status: None (Preliminary result)   Collection Time: 07/17/19 12:43 AM   Specimen: BLOOD  Result Value Ref Range Status   Specimen Description BLOOD RIGHT ARM  Final   Special Requests   Final    BOTTLES DRAWN AEROBIC AND ANAEROBIC Blood Culture adequate volume   Culture   Final    NO GROWTH 3 DAYS Performed at [facility], [place] [address] [place], [place] [address]    Report Status PENDING  Incomplete  Culture, blood (Routine X 2) w Reflex to ID Panel     Status: None (Preliminary result)   Collection Time: 07/17/19  1:00 AM   Specimen: BLOOD  Result Value Ref Range Status   Specimen Description BLOOD LEFT HAND  Final   Special Requests   Final     BOTTLES DRAWN AEROBIC AND ANAEROBIC Blood Culture adequate volume   Culture   Final    NO GROWTH 3 DAYS Performed at [facility], [place] [address] [place],  [address]    Report Status PENDING  Incomplete  Surgical PCR screen     Status: None   Collection Time: 07/19/19  4:30 PM   Specimen: Nasal Mucosa; Nasal Swab  Result Value Ref Range Status   MRSA, PCR NEGATIVE NEGATIVE Final   Staphylococcus aureus NEGATIVE NEGATIVE Final    Comment: (NOTE) The Xpert SA Assay (FDA approved for NASAL specimens in patients 78 years of age and older), is one component  of a comprehensive surveillance program. It is not intended to diagnose infection nor to guide or monitor treatment. Performed at [facility], [place] [address], [place], [name] [address]       Radiology Studies: Vas [place] Abi With/wo Tbi  Result Date: 07/19/2019 LOWER EXTREMITY DOPPLER STUDY Indications: Ulceration, and Gout and abscess and osteomyelitis of right second              toe. High Risk         Hypertension, hyperlipidemia, Diabetes, coronary artery Factors:          disease. Other Factors: Crack cocaine abuse, HIV,.  Comparison Study: No prior study on file for comparison Performing Technologist: Sharion Dove RVS  Examination Guidelines: A complete evaluation includes at minimum, Doppler waveform signals and systolic blood pressure reading at the level of bilateral brachial, anterior tibial, and posterior tibial arteries, when vessel segments are accessible. Bilateral testing is considered an integral part of a complete examination. Photoelectric Plethysmograph (PPG) waveforms and toe systolic pressure readings are included as required and additional duplex testing as needed. Limited examinations for reoccurring indications may be performed as noted.  ABI Findings: +---------+------------------+-----+---------+--------+ Right    Rt Pressure (mmHg)IndexWaveform Comment   +---------+------------------+-----+---------+--------+ Brachial 139                    triphasic         +---------+------------------+-----+---------+--------+ PTA      163               1.17 biphasic          +---------+------------------+-----+---------+--------+ DP       164               1.18 biphasic          +---------+------------------+-----+---------+--------+ Great Toe80                0.58                   +---------+------------------+-----+---------+--------+ +---------+------------------+-----+---------+-------+ Left     Lt Pressure (mmHg)IndexWaveform Comment +---------+------------------+-----+---------+-------+ Brachial 136                    triphasic        +---------+------------------+-----+---------+-------+ PTA      130               0.94 biphasic         +---------+------------------+-----+---------+-------+ DP       136               0.98 biphasic         +---------+------------------+-----+---------+-------+ Great Toe88                0.63                  +---------+------------------+-----+---------+-------+ +-------+-----------+-----------+------------+------------+ ABI/TBIToday's ABIToday's TBIPrevious ABIPrevious TBI +-------+-----------+-----------+------------+------------+ Right  1.18       0.58                                +-------+-----------+-----------+------------+------------+ Left   0.98       0.63                                +-------+-----------+-----------+------------+------------+  Summary: Right: Resting right ankle-brachial index is within normal range. No evidence of significant right lower extremity  arterial disease. The right toe-brachial index is abnormal. Left: Resting left ankle-brachial index is within normal range. No evidence of significant left lower extremity arterial disease. The left toe-brachial index is abnormal.  *See table(s) above for measurements and observations.   Electronically signed by Monica Martinez MD on 07/19/2019 at 7:10:13 PM.    Final     Scheduled Meds: . allopurinol  300 mg Oral Daily  . aspirin  81 mg Oral Daily  . chlorhexidine  60 mL Topical Once  . colchicine  0.6 mg Oral Daily  . diltiazem  120 mg Oral Daily  . emtricitabine-rilpivir-tenofovir AF  1 tablet Oral Q breakfast  . famotidine  20 mg Oral Daily  . feeding supplement (ENSURE ENLIVE)  237 mL Oral BID BM  . feeding supplement  296 mL Oral Once  . ferrous sulfate  325 mg Oral BID WC  . folic acid  1 mg Oral Daily  . gabapentin  400 mg Oral BID  . heparin injection (subcutaneous)  5,000 Units Subcutaneous Q8H  . insulin aspart  0-9 Units Subcutaneous TID WC  . insulin glargine  14 Units Subcutaneous QHS  . multivitamin  1 tablet Oral Daily  . mupirocin ointment  1 application Nasal BID  . nutrition supplement (JUVEN)  1 packet Oral BID BM  . risperiDONE  0.25 mg Oral BID  . sertraline  50 mg Oral Daily  . sodium chloride flush  3 mL Intravenous Once  . thiamine  100 mg Oral Daily   Continuous Infusions: .  ceFAZolin (ANCEF) IV    . cefTRIAXone (ROCEPHIN)  IV 2 g (07/20/19 1200)   And  . metronidazole 500 mg (07/21/19 0559)     LOS: 4 days   Time spent: 29 minutes   Darliss Cheney, MD [facility]  07/21/2019, 8:28 AM   To contact the attending provider between 7A-7P or the covering provider during after hours 7P-7A, please log into the web site [email or web address] and use password [email or web address].

## 2019-07-21 NOTE — Anesthesia Preprocedure Evaluation (Addendum)
Anesthesia Evaluation  Patient identified by MRN, date of birth, ID band Patient awake    Reviewed: Allergy & Precautions, NPO status , Patient's Chart, lab work & pertinent test results  Airway Mallampati: II  TM Distance: >3 FB Neck ROM: Full    Dental  (+) Teeth Intact, Dental Advisory Given   Pulmonary asthma ,    Pulmonary exam normal breath sounds clear to auscultation       Cardiovascular hypertension, Pt. on medications + CAD, + Past MI, + Cardiac Stents, + CABG and +CHF  Normal cardiovascular exam+ dysrhythmias Atrial Fibrillation  Rhythm:Regular Rate:Normal     Neuro/Psych  Headaches, PSYCHIATRIC DISORDERS Depression  Neuromuscular disease    GI/Hepatic GERD  ,(+)     substance abuse  cocaine use, Hepatitis -, C  Endo/Other  diabetes, Type 2, Insulin Dependent, Oral Hypoglycemic Agents  Renal/GU CRFRenal disease     Musculoskeletal  (+) Arthritis , Fibromyalgia -Osteomyelitis Right 2nd Toe   Abdominal   Peds  Hematology  (+) Blood dyscrasia, anemia , HIV,   Anesthesia Other Findings Day of surgery medications reviewed with the patient.  Reproductive/Obstetrics                            Anesthesia Physical Anesthesia Plan  ASA: IV  Anesthesia Plan: General   Post-op Pain Management:    Induction: Intravenous  PONV Risk Score and Plan: 2 and Ondansetron and Dexamethasone  Airway Management Planned: LMA  Additional Equipment:   Intra-op Plan:   Post-operative Plan: Extubation in OR  Informed Consent: I have reviewed the patients History and Physical, chart, labs and discussed the procedure including the risks, benefits and alternatives for the proposed anesthesia with the patient or authorized representative who has indicated his/her understanding and acceptance.     Dental advisory given  Plan Discussed with: CRNA  Anesthesia Plan Comments:          Anesthesia Quick Evaluation

## 2019-07-21 NOTE — Transfer of Care (Signed)
Immediate Anesthesia Transfer of Care Note  Patient: Michael [name]  Procedure(s) Performed: RIGHT SECOND TOE AMPUTATION (Right )  Patient Location: PACU  Anesthesia Type:General  Level of Consciousness: drowsy  Airway & Oxygen Therapy: Patient Spontanous Breathing and Patient connected to face mask oxygen  Post-op Assessment: Report given to RN and Post -op Vital signs reviewed and stable  Post vital signs: Reviewed and stable  Last Vitals:  Vitals Value Taken Time  BP 93/54 07/21/19 1144  Temp    Pulse 84 07/21/19 1145  Resp 14 07/21/19 1145  SpO2 98 % 07/21/19 1145  Vitals shown include unvalidated device data.  Last Pain:  Vitals:   07/21/19 0900  TempSrc:   PainSc: 7       Patients Stated Pain Goal: 0 (56/25/63 8937)  Complications: No apparent anesthesia complications

## 2019-07-21 NOTE — Anesthesia Postprocedure Evaluation (Signed)
Anesthesia Post Note  Patient: Michael [name]  Procedure(s) Performed: RIGHT SECOND TOE AMPUTATION (Right )     Patient location during evaluation: PACU Anesthesia Type: General Level of consciousness: awake and alert Pain management: pain level controlled Vital Signs Assessment: post-procedure vital signs reviewed and stable Respiratory status: spontaneous breathing, nonlabored ventilation and respiratory function stable Cardiovascular status: blood pressure returned to baseline and stable Postop Assessment: no apparent nausea or vomiting Anesthetic complications: no    Last Vitals:  Vitals:   07/21/19 1255 07/21/19 1300  BP:  119/71  Pulse: 81 83  Resp: 20 18  Temp:    SpO2: 97% 96%    Last Pain:  Vitals:   07/21/19 1300  TempSrc:   PainSc: [name]

## 2019-07-21 NOTE — Plan of Care (Signed)

## 2019-07-21 NOTE — Op Note (Signed)
07/21/2019  11:41 AM  PATIENT:  [name] DIAGNOSIS:  Osteomyelitis Right SECOND Toe  POST-OPERATIVE DIAGNOSIS:  Same  PROCEDURE:  RIGHT SECOND TOE AMPUTATION  SURGEON:  [name], MD  PHYSICIAN ASSISTANT:None ANESTHESIA:   General  PREOPERATIVE INDICATIONS:  Michael [name] is a  68 y.o. male with a diagnosis of Osteomyelitis Right SECOND Toe who failed conservative measures and elected for surgical management.    The risks benefits and alternatives were discussed with the patient preoperatively including but not limited to the risks of infection, bleeding, nerve injury, cardiopulmonary complications, the need for revision surgery, among others, and the patient was willing to proceed.  OPERATIVE IMPLANTS: none  @ENCIMAGES @  OPERATIVE FINDINGS: Abscess and necrosis of the second toe at the PIP joint  OPERATIVE PROCEDURE: Patient was brought the operating room and underwent general anesthetic.  After adequate levels anesthesia were obtained patient's right lower extremity was prepped using DuraPrep draped into a sterile field a timeout was called.  AV incision was made around the second toe.  The toe was amputated through the MTP joint.  There was purulent drainage from the PIP joint.  There is no active infection at the amputation site.  Electrocautery was used for hemostasis the wound was irrigated with normal saline.  Incision was closed using 2-0 nylon a sterile dressing was applied patient was extubated taken the PACU in stable condition.   DISCHARGE PLANNING:  Antibiotic duration: Plan for antibiotics 24 hours postoperatively  Weightbearing: Touchdown weightbearing on the right  Pain medication: Opioid pathway ordered  Dressing care/ Wound VAC: Will change the dressing in the office in 1 week  Ambulatory devices: Walker or crutches  Discharge to: Home  Follow-up: In the office 1 week post operative.

## 2019-07-22 ENCOUNTER — Encounter (HOSPITAL_COMMUNITY): Payer: Self-pay | Admitting: Orthopedic Surgery

## 2019-07-22 LAB — GLUCOSE, CAPILLARY
Glucose-Capillary: 370 mg/dL — ABNORMAL HIGH (ref 70–99)
Glucose-Capillary: 292 mg/dL — ABNORMAL HIGH (ref 70–99)
Glucose-Capillary: 305 mg/dL — ABNORMAL HIGH (ref 70–99)
Glucose-Capillary: 310 mg/dL — ABNORMAL HIGH (ref 70–99)

## 2019-07-22 LAB — CBC WITH DIFFERENTIAL/PLATELET
Abs Immature Granulocytes: 0.05 10*3/uL (ref 0.00–0.07)
Basophils Absolute: 0 10*3/uL (ref 0.0–0.1)
Basophils Relative: 0 %
Eosinophils Absolute: 0 10*3/uL (ref 0.0–0.5)
Eosinophils Relative: 0 %
HCT: 26.9 % — ABNORMAL LOW (ref 39.0–52.0)
Hemoglobin: 8.9 g/dL — ABNORMAL LOW (ref 13.0–17.0)
Immature Granulocytes: 1 %
Lymphocytes Relative: 9 %
Lymphs Abs: 0.6 10*3/uL — ABNORMAL LOW (ref 0.7–4.0)
MCH: 26.2 pg (ref 26.0–34.0)
MCHC: 33.1 g/dL (ref 30.0–36.0)
MCV: 79.1 fL — ABNORMAL LOW (ref 80.0–100.0)
Monocytes Absolute: 0.3 10*3/uL (ref 0.1–1.0)
Monocytes Relative: 4 %
Neutro Abs: 5.6 10*3/uL (ref 1.7–7.7)
Neutrophils Relative %: 86 %
Platelets: 209 10*3/uL (ref 150–400)
RBC: 3.4 MIL/uL — ABNORMAL LOW (ref 4.22–5.81)
RDW: 13.9 % (ref 11.5–15.5)
WBC: 6.6 10*3/uL (ref 4.0–10.5)
nRBC: 0 % (ref 0.0–0.2)

## 2019-07-22 LAB — CULTURE, BLOOD (ROUTINE X 2)
Culture: NO GROWTH
Culture: NO GROWTH
Special Requests: ADEQUATE
Special Requests: ADEQUATE

## 2019-07-22 LAB — COMPREHENSIVE METABOLIC PANEL
ALT: 40 U/L (ref 0–44)
AST: 67 U/L — ABNORMAL HIGH (ref 15–41)
Albumin: 2.8 g/dL — ABNORMAL LOW (ref 3.5–5.0)
Alkaline Phosphatase: 47 U/L (ref 38–126)
Anion gap: 12 (ref 5–15)
BUN: 36 mg/dL — ABNORMAL HIGH (ref 8–23)
CO2: 21 mmol/L — ABNORMAL LOW (ref 22–32)
Calcium: 8.7 mg/dL — ABNORMAL LOW (ref 8.9–10.3)
Chloride: 98 mmol/L (ref 98–111)
Creatinine, Ser: 1.98 mg/dL — ABNORMAL HIGH (ref 0.61–1.24)
GFR calc Af Amer: 39 mL/min — ABNORMAL LOW (ref >60)
GFR calc non Af Amer: 33 mL/min — ABNORMAL LOW (ref >60)
Glucose, Bld: 411 mg/dL — ABNORMAL HIGH (ref 70–99)
Potassium: 4.8 mmol/L (ref 3.5–5.1)
Sodium: 131 mmol/L — ABNORMAL LOW (ref 135–145)
Total Bilirubin: 0.7 mg/dL (ref 0.3–1.2)
Total Protein: 6.4 g/dL — ABNORMAL LOW (ref 6.5–8.1)

## 2019-07-22 MED ORDER — SODIUM CHLORIDE 0.9 % IV SOLN
INTRAVENOUS | Status: AC
  Administered 2019-07-22 – 2019-07-23 (×3): via INTRAVENOUS
  Filled 2019-07-22 (×4): qty 1000

## 2019-07-22 MED ORDER — INSULIN GLARGINE 100 UNIT/ML ~~LOC~~ SOLN
12.0000 [IU] | Freq: Two times a day (BID) | SUBCUTANEOUS | Status: DC
  Administered 2019-07-22 (×2): 12 [IU] via SUBCUTANEOUS
  Filled 2019-07-22 (×4): qty 0.12

## 2019-07-22 NOTE — TOC Progression Note (Signed)
Transition of Care [facility]) - Progression Note    Patient Details  Name: Michael [name] MRN: [id number] Date of Birth: 1951-04-27  Transition of Care [facility]) CM/SW Contact  Graves-Bigelow, Ocie Cornfield, RN Phone Number: 07/22/2019, 4:15 PM  Clinical Narrative:  Initiated the [place] for [facility]- Pending reference # is [id number]. Authorization to be initiated 07-22-19. Navi will call CM when authorization approved. Clinicals submitted to [facility] fax at [phone]. Will follow for additional transition of care needs.   Expected Discharge Plan: [place] Barriers to Discharge: Continued Medical Work up(Plan for toe amputation on Wednesday 07-21-19)  Expected Discharge Plan and Services Expected Discharge Plan: [place]   Discharge Planning Services: CM Consult   Living arrangements for the past 2 months: Hotel/Motel    Social Determinants of Health (SDOH) Interventions    Readmission Risk Interventions Readmission Risk Prevention Plan 07/19/2019  Transportation Screening Complete  Medication Review ([place]) Complete  HRI or Home Care Consult Complete  Palliative Care Screening Not Applicable  Some recent data might be hidden

## 2019-07-22 NOTE — Progress Notes (Signed)
Inpatient Diabetes Program Recommendations  AACE/ADA: New Consensus Statement on Inpatient Glycemic Control (2015)  Target Ranges:  Prepandial:   less than 140 mg/dL      Peak postprandial:   less than 180 mg/dL (1-2 hours)      Critically ill patients:  140 - 180 mg/dL   Lab Results  Component Value Date   GLUCAP 370 (H) 07/22/2019   HGBA1C 9.7 (H) 07/17/2019    Review of Glycemic Control Results for Michael [name], Michael [name] (MRN [id number]) as of 07/22/2019 10:03  Ref. Range 07/21/2019 16:59 07/21/2019 17:59 07/21/2019 21:03 07/22/2019 08:14  Glucose-Capillary Latest Ref Range: 70 - 99 mg/dL 370 (H) 371 (H) 335 (H) 370 (H)   Diabetes history:DM 2 Outpatient Diabetes medications:Lantus 20 units qhs, Humalog 4-6 units Daily Current orders for Inpatient glycemic control: Lantus 14 units qhs Novolog 0-9 units tid Decadron 10 mg x 1  Inpatient Diabetes Program Recommendations:    Anticipate trends to remain increased today due to administration of Decadron on 10/14.   Could consider slight increase to Lantus to 16 units QHS.   If blood sugars continue to exceed 180 mg/dL, consider adding Novolog 3 units TID (assuming patient is consuming >50% of meals).   Thanks, Bronson Curb, MSN, RNC-OB Diabetes Coordinator [phone] (8a-5p)

## 2019-07-22 NOTE — Progress Notes (Signed)
PROGRESS NOTE    Michael [name]  FUX:[id number] DOB: 1950/11/26 DOA: 07/16/2019 PCP: [name], PA-C   Brief Narrative:  Michael [name] is a 68 y.o. male with medical history significant of hypertension, hyperlipidemia, diabetes mellitus, asthma, GERD, gout, depression, cocaine abuse, PE not on anticoagulants, HIV, CAD, s/po of CABG, s/p of stent placement, atrial fibrillation not on anticoagulants, CKD stage III, iron deficiency anemia, who presented with chest pain.  Per patient, he has been having chest pain intermittently for more than a year since he had his CABG surgery but his pain has worsened in the last 2 days.  This was located at substernal area.  Aggravated with cough and deep breaths.  Patient recently had long distant travel to [place] a month ago.  No other symptoms.  He used cocaine day prior to admission.  Also had swelling and pain in right second toe.  Upon arrival to ED, pt was found to have troponin 102, WBC 6.4, negative COVID-19 test, renal function close to baseline, temperature normal, blood pressure 145/86, heart rate 72, oxygen saturation 99% on room air.  Chest x-ray is negative.  X-ray of right foot showed possible distal second toe osteomyelitis.  Patient was admitted under hospitalist service.  He was ruled out of MI with negative cardiac enzymes.  MRI of right foot confirmed right second toe osteomyelitis and possible abscess.  Patient was seen by Dr. [name] and underwent right second toe ray amputation on 07/21/2019.   Assessment & Plan:   Principal Problem:   Chest pain Active Problems:   HIV (human immunodeficiency virus infection) ([place])   Hyperlipidemia with target LDL less than 100   3-vessel CAD   Severe recurrent major depressive disorder with psychotic features ([name])   Cocaine use disorder, severe, dependence ([place])   Gout   Essential hypertension   Type II diabetes mellitus with renal manifestations ([name])   GERD (gastroesophageal  reflux disease)   S/P CABG (coronary artery bypass graft)   Chronic kidney disease, stage III (moderate)   Paroxysmal A-fib ([name])   Toe pain, right-second   Osteomyelitis ([name])   Abscess or cellulitis of toe, right  Osteomyelitis of right second toe: MRI confirms osteomyelitis throughout distal phalanx of second toe with some fluid collection surrounding distal phalanx, consistent with an abscess.  Blood cultures have remained negative.  Postoperative day status post right second toe ray amputation.  We will continue current IV antibiotics of Rocephin and Flagyl for at least 24 hours postop which will be today.  No more antibiotics tomorrow.  PT OT to see.  Chest pain with history of CAD/three-vessel disease: Cardiac enzymes negative.  He tells me that he has been having this chest pain on and off since about 1 year when he had his CABG done.  His pain seems to be likely secondary to either cocaine use or due to sternotomy.  He has point tenderness. No EKG changes.  No further work-up indicated.  HIV: CD4 count 270 on 09/16/2018.  Continue home medications.  Follow patient.  Hyperlipidemia: Not on any medications.  Cocaine use disorder: Counseling regarding cessation provided.  Essential hypertension: Controlled.  Continue oral Cardizem.  Continue as needed hydralazine.  Type 2 diabetes mellitus: Blood sugar elevated.  Will increase Lantus from 14 units daily to 12 units twice daily.  Uses 20 units at home.  Continue SSI.  Paroxysmal A. Fib: Patient's cha2ds2 vascular 2 score is 4 but he is not on any anticoagulation.  Reason  unknown. ?  Noncompliance.  I tried to initiate conversation with him however he did not seem to comprehend quite well the risks and complications.  I will have repeat conversation with him tomorrow.  Anemia of chronic disease: His baseline seems to be between 9 and 10.  Currently at baseline.  Watch daily.  CKD stage III: His baseline creatinine seems to be between  1.5 and 1.8.  There is slight jump today at 1.98.  We will start him on gentle IV hydration for next 24 hours.  Pete labs in the morning.  DVT prophylaxis: Heparin subcu Code Status: Full code Family Communication:  None present at bedside.  Plan of care discussed with patient in length and he verbalized understanding and agreed with it. Disposition Plan: TBD.  PT OT to see.  Will likely require SNF.  Estimated body mass index is 25.38 kg/m as calculated from the following:   Height as of this encounter: 5\' 8"  (1.727 m).   Weight as of this encounter: 75.7 kg.     Nutritional status:  Nutrition Problem: Increased nutrient needs Etiology: wound healing, chronic illness(osteomyelitis of right toe; HIV)   Signs/Symptoms: estimated needs   Interventions: Ensure Enlive (each supplement provides 350kcal and 20 grams of protein), MVI, Juven  Consultants:   Orthopedics  Procedures:   None  Antimicrobials:   IV Rocephin and Flagyl.   Subjective: Patient seen and examined.  Complains of right foot pain.  Also complains of intermittent chest pain, sharp.  He tells me that his chest pain has been ongoing for about a year since he had his CABG done.  Objective: Vitals:   07/21/19 2100 07/22/19 0319 07/22/19 0822 07/22/19 1023  BP: 127/80 (!) 145/82 130/83 124/78  Pulse: 86 82 80 86  Resp: 11 12 (!) 8 (!) 8  Temp: 98.5 F (36.9 C) 98 F (36.7 C) (!) 97 F (36.1 C) 98.2 F (36.8 C)  TempSrc: Oral Oral Oral Oral  SpO2: 96% 97% 99% 98%  Weight:      Height:        Intake/Output Summary (Last 24 hours) at 07/22/2019 1212 Last data filed at 07/22/2019 0818 Gross per 24 hour  Intake 1300 ml  Output 925 ml  Net 375 ml   Filed Weights   07/20/19 0545 07/21/19 0500 07/21/19 1018  Weight: 74.9 kg 75.7 kg 75.7 kg    Examination:  General exam: Appears calm and comfortable  Respiratory system: Clear to auscultation. Respiratory effort normal.  Point tenderness at the  central location of the sternotomy incision. Cardiovascular system: S1 & S2 heard, RRR. No JVD, murmurs, rubs, gallops or clicks. No pedal edema. Gastrointestinal system: Abdomen is nondistended, soft and nontender. No organomegaly or masses felt. Normal bowel sounds heard. Central nervous system: Alert and oriented. No focal neurological deficits. Extremities: Dressing in the right foot Skin: No rashes, lesions or ulcers.  Psychiatry: Judgement and insight appear poor. Mood & affect appropriate.   Data Reviewed: I have personally reviewed following labs and imaging studies  CBC: Recent Labs  Lab 07/18/19 0510 07/19/19 0613 07/20/19 0322 07/21/19 0316 07/22/19 0439  WBC 3.4* 4.1 3.4* 3.4* 6.6  NEUTROABS  --   --   --  1.8 5.6  HGB 9.2* 9.4* 9.1* 8.3* 8.9*  HCT 29.1* 29.0* 28.0* 26.2* 26.9*  MCV 80.4 78.8* 79.3* 79.9* 79.1*  PLT 161 178 172 170 253   Basic Metabolic Panel: Recent Labs  Lab 07/16/19 1452 07/18/19 0510 07/20/19 0322 07/21/19  0316 07/22/19 0439  NA 139 141 137 136 131*  K 3.9 4.0 4.2 4.5 4.8  CL 104 111 105 105 98  CO2 23 20* 23 24 21*  GLUCOSE 339* 155* 227* 285* 411*  BUN 34* 18 25* 30* 36*  CREATININE 1.70* 1.42* 1.72* 1.66* 1.98*  CALCIUM 9.4 8.5* 8.7* 8.7* 8.7*   GFR: Estimated Creatinine Clearance: 34.1 mL/min (A) (by C-G formula based on SCr of 1.98 mg/dL (H)). Liver Function Tests: Recent Labs  Lab 07/21/19 0316 07/22/19 0439  AST 40 67*  ALT 23 40  ALKPHOS 50 47  BILITOT 0.5 0.7  PROT 6.1* 6.4*  ALBUMIN 2.5* 2.8*   No results for input(s): LIPASE, AMYLASE in the last 168 hours. No results for input(s): AMMONIA in the last 168 hours. Coagulation Profile: No results for input(s): INR, PROTIME in the last 168 hours. Cardiac Enzymes: No results for input(s): CKTOTAL, CKMB, CKMBINDEX, TROPONINI in the last 168 hours. BNP (last 3 results) No results for input(s): PROBNP in the last 8760 hours. HbA1C: No results for input(s): HGBA1C in  the last 72 hours. CBG: Recent Labs  Lab 07/21/19 1659 07/21/19 1759 07/21/19 2103 07/22/19 0814 07/22/19 1110  GLUCAP 370* 371* 335* 370* 292*   Lipid Profile: No results for input(s): CHOL, HDL, LDLCALC, TRIG, CHOLHDL, LDLDIRECT in the last 72 hours. Thyroid Function Tests: No results for input(s): TSH, T4TOTAL, FREET4, T3FREE, THYROIDAB in the last 72 hours. Anemia Panel: No results for input(s): VITAMINB12, FOLATE, FERRITIN, TIBC, IRON, RETICCTPCT in the last 72 hours. Sepsis Labs: No results for input(s): PROCALCITON, LATICACIDVEN in the last 168 hours.  Recent Results (from the past 240 hour(s))  SARS CORONAVIRUS 2 (TAT 6-24 HRS) Nasopharyngeal Nasopharyngeal Swab     Status: None   Collection Time: 07/16/19  4:35 PM   Specimen: Nasopharyngeal Swab  Result Value Ref Range Status   SARS Coronavirus 2 NEGATIVE NEGATIVE Final    Comment: (NOTE) SARS-CoV-2 target nucleic acids are NOT DETECTED. The SARS-CoV-2 RNA is generally detectable in upper and lower respiratory specimens during the acute phase of infection. Negative results do not preclude SARS-CoV-2 infection, do not rule out co-infections with other pathogens, and should not be used as the sole basis for treatment or other patient management decisions. Negative results must be combined with clinical observations, patient history, and epidemiological information. The expected result is Negative. Fact Sheet for Patients: SugarRoll.be Fact Sheet for Healthcare Providers: https://www.woods-mathews.com/ This test is not yet approved or cleared by the [place] FDA and  has been authorized for detection and/or diagnosis of SARS-CoV-2 by FDA under an Emergency Use Authorization (EUA). This EUA will remain  in effect (meaning this test can be used) for the duration of the COVID-19 declaration under Section 56 4(b)(1) of the Act, 21 U.S.C. section 360bbb-3(b)(1), unless the  authorization is terminated or revoked sooner. Performed at [facility], [place] [address], [place], [place] [address]   Culture, blood (Routine X 2) w Reflex to ID Panel     Status: None   Collection Time: 07/17/19 12:43 AM   Specimen: BLOOD  Result Value Ref Range Status   Specimen Description BLOOD RIGHT ARM  Final   Special Requests   Final    BOTTLES DRAWN AEROBIC AND ANAEROBIC Blood Culture adequate volume   Culture   Final    NO GROWTH 5 DAYS Performed at [facility], [place] [address] [place], [place] [address]    Report Status 07/22/2019 FINAL  Final  Culture, blood (Routine X 2) w Reflex to ID Panel     Status: None   Collection Time: 07/17/19  1:00 AM   Specimen: BLOOD  Result Value Ref Range Status   Specimen Description BLOOD LEFT HAND  Final   Special Requests   Final    BOTTLES DRAWN AEROBIC AND ANAEROBIC Blood Culture adequate volume   Culture   Final    NO GROWTH 5 DAYS Performed at [facility], [address], [place], [place] [address]    Report Status 07/22/2019 FINAL  Final  Surgical PCR screen     Status: None   Collection Time: 07/19/19  4:30 PM   Specimen: Nasal Mucosa; Nasal Swab  Result Value Ref Range Status   MRSA, PCR NEGATIVE NEGATIVE Final   Staphylococcus aureus NEGATIVE NEGATIVE Final    Comment: (NOTE) The Xpert SA Assay (FDA approved for NASAL specimens in patients 84 years of age and older), is one component of a comprehensive surveillance program. It is not intended to diagnose infection nor to guide or monitor treatment. Performed at [facility], [place] [address], [place], [place] [address]       Radiology Studies: No results found.  Scheduled Meds: . allopurinol  300 mg Oral Daily  . aspirin  81 mg Oral Daily  . colchicine  0.6 mg Oral Daily  . diltiazem  120 mg Oral Daily  . docusate sodium  100 mg Oral BID  . emtricitabine-rilpivir-tenofovir AF  1 tablet Oral Q breakfast  . famotidine  20 mg Oral  Daily  . feeding supplement (ENSURE ENLIVE)  237 mL Oral BID BM  . feeding supplement  296 mL Oral Once  . ferrous sulfate  325 mg Oral BID WC  . folic acid  1 mg Oral Daily  . gabapentin  400 mg Oral BID  . heparin injection (subcutaneous)  5,000 Units Subcutaneous Q8H  . insulin aspart  0-9 Units Subcutaneous TID WC  . insulin glargine  14 Units Subcutaneous QHS  . multivitamin  1 tablet Oral Daily  . mupirocin ointment  1 application Nasal BID  . nutrition supplement (JUVEN)  1 packet Oral BID BM  . risperiDONE  0.25 mg Oral BID  . sertraline  50 mg Oral Daily  . sodium chloride flush  3 mL Intravenous Once  . thiamine  100 mg Oral Daily   Continuous Infusions: . sodium chloride    . cefTRIAXone (ROCEPHIN)  IV Stopped (07/21/19 1627)   And  . metronidazole 500 mg (07/22/19 0509)  . lactated ringers 10 mL/hr at 07/21/19 1021  . methocarbamol (ROBAXIN) IV    . sodium chloride 0.9 % 1,000 mL with potassium chloride 10 mEq infusion 125 mL/hr at 07/22/19 1053     LOS: 5 days   Time spent: 28 minutes   Darliss Cheney, MD [facility]  07/22/2019, 12:12 PM   To contact the attending provider between 7A-7P or the covering provider during after hours 7P-7A, please log into the web site [email or web address] and use password [email or web address].

## 2019-07-22 NOTE — Evaluation (Signed)
Physical Therapy Evaluation Patient Details Name: Michael [name] MRN: [id number] DOB: 09-29-1951 Today's Date: 07/22/2019   History of Present Illness  Pt adm with chest pain. Found to have osteomyelitis and underwent rt 2nd toe amputation on 10/14. PMH - covid, htn, HIV, cabg, dm, ckd, PE, cocaine abuse  Clinical Impression  Pt admitted with above diagnosis and presents to PT with functional limitations due to deficits listed below (See PT problem list). Pt needs skilled PT to maximize independence and safety to allow discharge to SNF. Pt needs assist with all mobility and does not have social support. Needs SNF until can mobilize adequately.      Follow Up Recommendations SNF    Equipment Recommendations  Rolling walker with 5" wheels    Recommendations for Other Services       Precautions / Restrictions Precautions Precautions: Fall Restrictions Weight Bearing Restrictions: No      Mobility  Bed Mobility Overal bed mobility: Needs Assistance Bed Mobility: Supine to Sit     Supine to sit: Supervision;HOB elevated     General bed mobility comments: Incr time to perform  Transfers Overall transfer level: Needs assistance Equipment used: Rolling walker (2 wheeled);Ambulation equipment used Transfers: Sit to/from Stand Sit to Stand: +2 physical assistance;Mod assist         General transfer comment: Assist to bring hips up and for balance. Pt with difficulty fully extending lt knee initially. Verbal cues for hand placement and to minimize weight on rt foot. Unable to pivot with walker so used stedy for bed to chair.  Ambulation/Gait             General Gait Details: Pt unable  Stairs            Wheelchair Mobility    Modified Rankin (Stroke Patients Only)       Balance Overall balance assessment: Needs assistance Sitting-balance support: No upper extremity supported;Feet supported Sitting balance-Leahy Scale: Good     Standing balance  support: Bilateral upper extremity supported Standing balance-Leahy Scale: Poor Standing balance comment: walker and mod assist for static standing                             Pertinent Vitals/Pain Pain Assessment: Faces Faces Pain Scale: Hurts whole lot Pain Location: rt foot Pain Descriptors / Indicators: Throbbing Pain Intervention(s): Limited activity within patient's tolerance;Monitored during session;Repositioned    Home Living Family/patient expects to be discharged to:: Shelter/Homeless                 Additional Comments: was living in hotel    Prior Function Level of Independence: Independent with assistive device(s)         Comments: Amb with cane     Hand Dominance   Dominant Hand: Right    Extremity/Trunk Assessment   Upper Extremity Assessment Upper Extremity Assessment: Generalized weakness    Lower Extremity Assessment Lower Extremity Assessment: Generalized weakness       Communication   Communication: No difficulties  Cognition Arousal/Alertness: Awake/alert Behavior During Therapy: WFL for tasks assessed/performed Overall Cognitive Status: Within Functional Limits for tasks assessed                                        General Comments      Exercises     Assessment/Plan    PT  Assessment Patient needs continued PT services  PT Problem List Decreased strength;Decreased activity tolerance;Decreased balance;Decreased mobility;Decreased knowledge of use of DME;Pain       PT Treatment Interventions DME instruction;Gait training;Functional mobility training;Therapeutic activities;Therapeutic exercise;Balance training;Patient/family education    PT Goals (Current goals can be found in the Care Plan section)  Acute Rehab PT Goals Patient Stated Goal: get better PT Goal Formulation: With patient Time For Goal Achievement: 08/05/19 Potential to Achieve Goals: Fair    Frequency Min 2X/week   Barriers  to discharge Decreased caregiver support homeless and no social support    Co-evaluation               AM-PAC PT "6 Clicks" Mobility  Outcome Measure Help needed turning from your back to your side while in a flat bed without using bedrails?: A Little Help needed moving from lying on your back to sitting on the side of a flat bed without using bedrails?: A Little Help needed moving to and from a bed to a chair (including a wheelchair)?: Total Help needed standing up from a chair using your arms (e.g., wheelchair or bedside chair)?: A Lot Help needed to walk in hospital room?: Total Help needed climbing 3-5 steps with a railing? : Total 6 Click Score: 11    End of Session Equipment Utilized During Treatment: Gait belt Activity Tolerance: Patient limited by pain;Patient limited by fatigue Patient left: in chair;with call bell/phone within reach;with chair alarm set Nurse Communication: Mobility status;Need for lift equipment PT Visit Diagnosis: Other abnormalities of gait and mobility (R26.89);Muscle weakness (generalized) (M62.81);Pain Pain - Right/Left: Right Pain - part of body: Ankle and joints of foot    Time: 1203-1230 PT Time Calculation (min) (ACUTE ONLY): 27 min   Charges:   PT Evaluation $PT Eval Moderate Complexity: 1 Mod PT Treatments $Therapeutic Activity: 8-22 mins        [place] Pager [phone] Office [place] 07/22/2019, 2:54 PM

## 2019-07-22 NOTE — Progress Notes (Signed)
Patient ID: Michael [name], male   DOB: [birth year], 68 y.o.   MRN: [id number] Patient is a 68 year old gentleman who is status post right second ray amputation.  Patient has no complaints at this time plan for physical therapy minimize weightbearing on the right foot discharge to home when patient is safe with therapy I will follow-up in the office in 1 week.

## 2019-07-22 NOTE — TOC Progression Note (Addendum)
Transition of Care [facility]) - Progression Note    Patient Details  Name: Michael [name] MRN: [id number] Date of Birth: 07-13-1951  Transition [facility]) CM/SW Contact  Graves-Bigelow, Ocie Cornfield, RN Phone Number: 07/22/2019, 3:41 PM  Clinical Narrative:  PT recommendations for SNF-Patient is agreeable to SNF- CM submitted- PASSR- under review at this time- awaiting. CM working on getting authorization from [employer] via [employer] [phone]). Will fax patient out to [facility] and [facility]. CM will continue to follow for additional transition of care needs.   Expected Discharge Plan: [place] Barriers to Discharge: Continued Medical Work up(Plan for toe amputation on Wednesday 07-21-19)  Expected Discharge Plan and Services Expected Discharge Plan: [place]   Discharge Planning Services: CM Consult   Living arrangements for the past 2 months: Hotel/Motel                  Social Determinants of Health (SDOH) Interventions    Readmission Risk Interventions Readmission Risk Prevention Plan 07/19/2019  Transportation Screening Complete  Medication Review ([place]) Complete  HRI or Home Care Consult Complete  Palliative Care Screening Not Applicable  Some recent data might be hidden

## 2019-07-22 NOTE — NC FL2 (Addendum)
[place] LEVEL OF CARE SCREENING TOOL     IDENTIFICATION  Patient Name: Michael [name] Birthdate: 11/28/1950 Sex: male Admission Date (Current Location): 07/16/2019  [facility] and [place] Number:  Herbalist and Address:         Provider Number: [id number]  Attending Physician Name and Address:  Darliss Cheney, MD  Relative Name and Phone Number:       Current Level of Care: SNF Recommended Level of Care: [place] Prior Approval Number:    Date Approved/Denied:   PASRR Number:   [id number]  Discharge Plan: SNF    Current Diagnoses: Patient Active Problem List   Diagnosis Date Noted  . Osteomyelitis ([place]) 07/17/2019  . Abscess or cellulitis of toe, right   . Toe pain, right-second   . MDD (major depressive disorder), recurrent episode, severe ([place]) 06/15/2019  . Respiratory distress 04/12/2019  . Pneumonia due to severe acute respiratory syndrome coronavirus 2 (SARS-CoV-2) 04/11/2019  . COVID-19 virus infection 03/19/2019  . Chest pain 10/03/2018  . Malnutrition of moderate degree 09/21/2018  . MDD (major depressive disorder), recurrent episode, moderate ([place])   . Type II diabetes mellitus with renal manifestations ([place]) 05/04/2018  . GERD (gastroesophageal reflux disease) 05/04/2018  . S/P CABG (coronary artery bypass graft)   . Left testicular pain   . Depression 02/20/2018  . Epididymo-orchitis, acute 02/20/2018  . Essential hypertension 02/20/2018  . Precordial chest pain 02/20/2018  . AKI (acute kidney injury) ([place]) 02/14/2018  . HCAP (healthcare-associated pneumonia) 02/14/2018  . History of pulmonary embolism 07/04/2017  . Acute hyponatremia 05/11/2017  . Hyperglycemia due to type 2 diabetes mellitus ([place]) 05/11/2017  . CHF (congestive heart failure) ([place]) 04/01/2017  . Hepatitis C 12/30/2016  . Chronic ischemic heart disease 11/12/2016  . Paroxysmal A-fib ([place]) 11/12/2016  . Back pain 04/18/2016  . S/P  carotid endarterectomy 11/15/2015  . MDD (major depressive disorder), recurrent severe, without psychosis ([place]) 09/09/2015  . Cocaine-induced mood disorder ([place]) 08/14/2015  . Cocaine abuse with cocaine-induced mood disorder ([place]) 08/14/2015  . Gout 07/10/2015  . Acute renal failure superimposed on stage 3 chronic kidney disease ([place]) 03/06/2015  . Normocytic anemia 03/06/2015  . Chronic kidney disease, stage III (moderate) 03/06/2015  . Hypoglycemia   . Encounter for general adult medical examination with abnormal findings 02/09/2015  . Cocaine use disorder, severe, dependence ([place]) 12/13/2014  . Substance or medication-induced depressive disorder with onset during withdrawal ([place]) 12/13/2014  . Severe recurrent major depressive disorder with psychotic features ([place]) 12/12/2014  . Cervicalgia 06/28/2014  . Lumbar radiculopathy, chronic 06/28/2014  . Asthma, chronic 02/03/2014  . 3-vessel CAD 06/24/2013  . ED (erectile dysfunction) of organic origin 07/07/2012  . Hypertension goal BP (blood pressure) < 140/80 04/29/2012  . Chondromalacia of left knee 03/19/2012  . Hyperlipidemia with target LDL less than 100 02/12/2012  . Fibromyalgia 02/12/2012  . HIV (human immunodeficiency virus infection) ([place]) 08/27/2011  . Uncontrolled type 2 diabetes with neuropathy ([place]) 10/17/2000    Orientation RESPIRATION BLADDER Height & Weight     Self, Time, Situation, Place  Normal Continent Weight: 75.7 kg Height:  5\' 8"  (172.7 cm)  BEHAVIORAL SYMPTOMS/MOOD NEUROLOGICAL BOWEL NUTRITION STATUS      Continent Diet(Diet Carb Modified Fluid consistency Thin)  AMBULATORY STATUS COMMUNICATION OF NEEDS Skin   Extensive Assist Verbally Normal                       Personal Care  Assistance Level of Assistance  Bathing, Feeding, Dressing Bathing Assistance: Limited assistance Feeding assistance: Independent Dressing Assistance: Limited assistance     Functional Limitations Info  Sight, Hearing,  Speech Sight Info: Adequate Hearing Info: Adequate Speech Info: Adequate    SPECIAL CARE FACTORS FREQUENCY  PT (By licensed PT), OT (By licensed OT)     PT Frequency: 5 x week OT Frequency: 5 x week            Contractures Contractures Info: Not present    Additional Factors Info  Code Status, Allergies, Psychotropic, Insulin Sliding Scale Code Status Info: Full Allergies Info: No known Allergies Psychotropic Info: risperiDONE (RISPERDAL) tablet 0.25 mg  oral 2 times daily, sertraline (ZOLOFT) tablet 50 mg oral daily, Insulin Sliding Scale Info: insulin glargine (LANTUS) injection 12 Units Subcutaneous 2 times daily, insulin aspart (novoLOG) injection 0-9 Units Subcutaneous 3 times daily with meals       Current Medications (07/22/2019):  This is the current hospital active medication list Current Facility-Administered Medications  Medication Dose Route Frequency Provider Last Rate Last Dose  . 0.9 %  sodium chloride infusion   Intravenous Continuous [name], MD      . acetaminophen (TYLENOL) tablet 325-650 mg  325-650 mg Oral Q6H PRN [name], MD      . albuterol (PROVENTIL) (2.5 MG/3ML) 0.083% nebulizer solution 2.5 mg  2.5 mg Inhalation Q6H PRN [name], MD      . allopurinol (ZYLOPRIM) tablet 300 mg  300 mg Oral Daily [name], MD   300 mg at 07/22/19 0837  . aspirin chewable tablet 81 mg  81 mg Oral Daily [name], MD   81 mg at 07/22/19 0835  . bisacodyl (DULCOLAX) suppository 10 mg  10 mg Rectal Daily PRN [name], MD      . colchicine tablet 0.6 mg  0.6 mg Oral Daily [name], MD   0.6 mg at 07/22/19 0836  . diltiazem (CARDIZEM CD) 24 hr capsule 120 mg  120 mg Oral Daily [name], MD   120 mg at 07/22/19 0836  . docusate sodium (COLACE) capsule 100 mg  100 mg Oral BID [name], MD   100 mg at 07/22/19 3532  . emtricitabine-rilpivir-tenofovir AF (ODEFSEY) 200-25-25 MG per tablet 1 tablet  1 tablet Oral Q breakfast  [name], MD   1 tablet at 07/22/19 0835  . famotidine (PEPCID) tablet 20 mg  20 mg Oral Daily [name], MD   20 mg at 07/21/19 2254  . feeding supplement (ENSURE ENLIVE) (ENSURE ENLIVE) liquid 237 mL  237 mL Oral BID BM [name], MD   237 mL at 07/22/19 1327  . feeding supplement (ENSURE PRE-SURGERY) liquid 296 mL  296 mL Oral Once [name], MD      . ferrous sulfate tablet 325 mg  325 mg Oral BID WC [name], MD   325 mg at 07/22/19 0836  . folic acid (FOLVITE) tablet 1 mg  1 mg Oral Daily [name], MD   1 mg at 07/22/19 0835  . gabapentin (NEURONTIN) capsule 400 mg  400 mg Oral BID [name], MD   400 mg at 07/22/19 0836  . heparin injection 5,000 Units  5,000 Units Subcutaneous Q8H [name], MD   5,000 Units at 07/22/19 1327  . hydrALAZINE (APRESOLINE) tablet 25 mg  25 mg Oral TID PRN Meridee Score  V, MD   25 mg at 07/19/19 1047  . HYDROmorphone (DILAUDID) injection 0.5-1 mg  0.5-1 mg Intravenous Q4H PRN [name], MD   1 mg at 07/22/19 0501  . hydrOXYzine (ATARAX/VISTARIL) tablet 25 mg  25 mg Oral TID PRN [name], MD      . insulin aspart (novoLOG) injection 0-9 Units  0-9 Units Subcutaneous TID WC [name], MD   5 Units at 07/22/19 1233  . insulin glargine (LANTUS) injection 12 Units  12 Units Subcutaneous BID Darliss Cheney, MD   12 Units at 07/22/19 1327  . lactated ringers infusion   Intravenous Continuous [name], MD 10 mL/hr at 07/21/19 1021    . magnesium citrate solution 1 Bottle  1 Bottle Oral Once PRN [name], MD      . methocarbamol (ROBAXIN) tablet 500 mg  500 mg Oral Q6H PRN [name], MD   500 mg at 07/21/19 1546   Or  . methocarbamol (ROBAXIN) 500 mg in dextrose 5 % 50 mL IVPB  500 mg Intravenous Q6H PRN [name], MD      . metoCLOPramide (REGLAN) tablet 5-10 mg  5-10 mg Oral Q8H PRN [name], MD       Or  . metoCLOPramide (REGLAN) injection 5-10 mg  5-10 mg Intravenous Q8H PRN [name], MD      . metroNIDAZOLE (FLAGYL) IVPB 500 mg  500 mg Intravenous Q8H Pahwani, Einar Grad, MD 100 mL/hr at 07/22/19 1325 500 mg at 07/22/19 1325  . multivitamin (PROSIGHT) tablet 1 tablet  1 tablet Oral Daily [name], MD   1 tablet at 07/22/19 [phone]  . mupirocin ointment (BACTROBAN) 2 % 1 application  1 application Nasal BID [name], MD   1 application at 52/84/13 1055  . nutrition supplement (JUVEN) (JUVEN) powder packet 1 packet  1 packet Oral BID BM [name], MD   1 packet at 07/22/19 1326  . ondansetron (ZOFRAN) tablet 4 mg  4 mg Oral Q6H PRN [name], MD       Or  . ondansetron [facility]) injection 4 mg  4 mg Intravenous Q6H PRN [name], MD      . oxyCODONE (Oxy IR/ROXICODONE) immediate release tablet 10-15 mg  10-15 mg Oral Q4H PRN [name], MD   15 mg at 07/22/19 1236  . oxyCODONE (Oxy IR/ROXICODONE) immediate release tablet 5-10 mg  5-10 mg Oral Q4H PRN [name], MD   10 mg at 07/21/19 2254  . polyethylene glycol (MIRALAX / GLYCOLAX) packet 17 g  17 g Oral Daily PRN [name], MD   17 g at 07/20/19 2233  . risperiDONE (RISPERDAL) tablet 0.25 mg  0.25 mg Oral BID [name], MD   0.25 mg at 07/22/19 0836  . sertraline (ZOLOFT) tablet 50 mg  50 mg Oral Daily [name], MD   50 mg at 07/22/19 0835  . sodium chloride 0.9 % 1,000 mL with potassium chloride 10 mEq infusion   Intravenous Continuous Darliss Cheney, MD 125 mL/hr at 07/22/19 1053    . sodium chloride flush (NS) 0.9 % injection 3 mL  3 mL Intravenous Once [name], MD      . thiamine (VITAMIN B-1) tablet 100 mg  100 mg Oral Daily [name], MD   100 mg at 07/22/19 0835  . traZODone (DESYREL) tablet 100 mg  100  mg Oral QHS PRN [name], MD   100 mg at 07/20/19 2231     Discharge Medications: Please see discharge summary for a list of discharge medications.  Relevant Imaging Results:  Relevant Lab Results:   Additional  Information [id number]  Bethena Roys, RN

## 2019-07-23 LAB — COMPREHENSIVE METABOLIC PANEL
ALT: 48 U/L — ABNORMAL HIGH (ref 0–44)
AST: 71 U/L — ABNORMAL HIGH (ref 15–41)
Albumin: 3 g/dL — ABNORMAL LOW (ref 3.5–5.0)
Alkaline Phosphatase: 51 U/L (ref 38–126)
Anion gap: 9 (ref 5–15)
BUN: 40 mg/dL — ABNORMAL HIGH (ref 8–23)
CO2: 20 mmol/L — ABNORMAL LOW (ref 22–32)
Calcium: 8.7 mg/dL — ABNORMAL LOW (ref 8.9–10.3)
Chloride: 104 mmol/L (ref 98–111)
Creatinine, Ser: 1.52 mg/dL — ABNORMAL HIGH (ref 0.61–1.24)
GFR calc Af Amer: 53 mL/min — ABNORMAL LOW (ref >60)
GFR calc non Af Amer: 46 mL/min — ABNORMAL LOW (ref >60)
Glucose, Bld: 271 mg/dL — ABNORMAL HIGH (ref 70–99)
Potassium: 4.9 mmol/L (ref 3.5–5.1)
Sodium: 133 mmol/L — ABNORMAL LOW (ref 135–145)
Total Bilirubin: 0.5 mg/dL (ref 0.3–1.2)
Total Protein: 6.5 g/dL (ref 6.5–8.1)

## 2019-07-23 LAB — CBC WITH DIFFERENTIAL/PLATELET
Abs Immature Granulocytes: 0.05 10*3/uL (ref 0.00–0.07)
Basophils Absolute: 0 10*3/uL (ref 0.0–0.1)
Basophils Relative: 0 %
Eosinophils Absolute: 0.1 10*3/uL (ref 0.0–0.5)
Eosinophils Relative: 2 %
HCT: 28.9 % — ABNORMAL LOW (ref 39.0–52.0)
Hemoglobin: 9.2 g/dL — ABNORMAL LOW (ref 13.0–17.0)
Immature Granulocytes: 1 %
Lymphocytes Relative: 18 %
Lymphs Abs: 1.1 10*3/uL (ref 0.7–4.0)
MCH: 26.2 pg (ref 26.0–34.0)
MCHC: 31.8 g/dL (ref 30.0–36.0)
MCV: 82.3 fL (ref 80.0–100.0)
Monocytes Absolute: 0.6 10*3/uL (ref 0.1–1.0)
Monocytes Relative: 10 %
Neutro Abs: 4.5 10*3/uL (ref 1.7–7.7)
Neutrophils Relative %: 69 %
Platelets: 188 10*3/uL (ref 150–400)
RBC: 3.51 MIL/uL — ABNORMAL LOW (ref 4.22–5.81)
RDW: 14.2 % (ref 11.5–15.5)
WBC: 6.5 10*3/uL (ref 4.0–10.5)
nRBC: 0 % (ref 0.0–0.2)

## 2019-07-23 LAB — GLUCOSE, CAPILLARY
Glucose-Capillary: 183 mg/dL — ABNORMAL HIGH (ref 70–99)
Glucose-Capillary: 125 mg/dL — ABNORMAL HIGH (ref 70–99)
Glucose-Capillary: 74 mg/dL (ref 70–99)
Glucose-Capillary: 201 mg/dL — ABNORMAL HIGH (ref 70–99)

## 2019-07-23 MED ORDER — INSULIN GLARGINE 100 UNIT/ML ~~LOC~~ SOLN
15.0000 [IU] | Freq: Every day | SUBCUTANEOUS | Status: DC
  Administered 2019-07-24 – 2019-07-25 (×2): 15 [IU] via SUBCUTANEOUS
  Filled 2019-07-23 (×3): qty 0.15

## 2019-07-23 MED ORDER — INSULIN GLARGINE 100 UNIT/ML ~~LOC~~ SOLN
15.0000 [IU] | Freq: Two times a day (BID) | SUBCUTANEOUS | Status: DC
  Administered 2019-07-23: 15 [IU] via SUBCUTANEOUS
  Filled 2019-07-23 (×2): qty 0.15

## 2019-07-23 NOTE — Progress Notes (Signed)
Inpatient Diabetes Program Recommendations  AACE/ADA: New Consensus Statement on Inpatient Glycemic Control (2015)  Target Ranges:  Prepandial:   less than 140 mg/dL      Peak postprandial:   less than 180 mg/dL (1-2 hours)      Critically ill patients:  140 - 180 mg/dL   Lab Results  Component Value Date   GLUCAP 183 (H) 07/23/2019   HGBA1C 9.7 (H) 07/17/2019    Review of Glycemic Control Results for TALIS, IWAN (MRN [id number]) as of 07/23/2019 11:08  Ref. Range 07/22/2019 11:10 07/22/2019 15:44 07/22/2019 20:43 07/23/2019 08:03  Glucose-Capillary Latest Ref Range: 70 - 99 mg/dL 292 (H) 305 (H) 310 (H) 183 (H)   Diabetes history:DM 2 Outpatient Diabetes medications:Lantus 20 units qhs, Humalog 4-6 units Daily Current orders for Inpatient glycemic control: Lantus 15 units bid Novolog 0-9 units tid Decadron 10 mg x 1  Inpatient Diabetes Program Recommendations:  Noted changes to Lantus to 15 units BID. Given glucose trends prior to Decadron administration, would consider decreasing this back closer to patient's home dose of Lantus 18-20 units QD in an attempt to avoid hypoglycemia.   If blood sugars continue to exceed 180 mg/dL, consider adding Novolog 3 units TID (assuming patient is consuming >50% of meals).  Thanks, Bronson Curb, MSN, RNC-OB Diabetes Coordinator [phone] (8a-5p)

## 2019-07-23 NOTE — Care Management Important Message (Signed)
Important Message  Patient Details  Name: Michael [name] MRN: [id number] Date of Birth: 01-09-1951   Medicare Important Message Given:  Yes     Shelda Altes 07/23/2019, 12:15 PM

## 2019-07-23 NOTE — TOC Progression Note (Signed)
Transition of Care [facility]) - Progression Note    Patient Details  Name: Michael [name] MRN: [id number] Date of Birth: [birth year]  [facility]) CM/SW Contact  Graves-Bigelow, Ocie Cornfield, RN Phone Number: 07/23/2019, 3:23 PM  Clinical Narrative:  CM unable to find a SNF willing to accept the patient due to cocaine abuse. CM did speak with [facility] for guidance:  PT/OT to evaluate Saturday and Sunday to see if the recommendations can be safely changed to home with [facility]. CM spoke to [facility] in rehab and she will send a page to the weekend staff to see if this request can be accommodated. CM will continue to follow for transition of care needs.    Expected Discharge Plan: [place] Barriers to Discharge: Continued Medical Work up(Plan for toe amputation on Wednesday 07-21-19)  Expected Discharge Plan and Services Expected Discharge Plan: [place]   Discharge Planning Services: CM Consult   Living arrangements for the past 2 months: Hotel/Motel                                       Social Determinants of Health (SDOH) Interventions    Readmission Risk Interventions Readmission Risk Prevention Plan 07/19/2019  Transportation Screening Complete  Medication Review ([place]) Complete  HRI or Home Care Consult Complete  Palliative Care Screening Not Applicable  Some recent data might be hidden

## 2019-07-23 NOTE — Progress Notes (Signed)
Nutrition Follow-up  INTERVENTION:   -Ensure Enlive po BID, each supplement provides 350 kcal and 20 grams of protein -Juven BID, each packet provides 95 calories, 2.5 grams of protein (collagen), and 9.8 grams of carbohydrate (3 grams sugar); also contains 7 grams of L-arginine and L-glutamine, 300 mg vitamin C, 15 mg vitamin E, 1.2 mcg vitamin B-12, 9.5 mg zinc, 200 mg calcium, and 1.5 g  Calcium Beta-hydroxy-Beta-methylbutyrate to support wound healing -Continue Prosight daily  NUTRITION DIAGNOSIS:   Increased nutrient needs related to wound healing, chronic illness(osteomyelitis of right toe; HIV) as evidenced by estimated needs.  Ongoing.  GOAL:   Patient will meet greater than or equal to 90% of their needs  Progressing.  MONITOR:   PO intake, Labs, I & O's, Supplement acceptance, Weight trends, Skin  ASSESSMENT:   68 year old male with medical history significant of HTN, HLD, T2DM, asthma, GERD, gout, cocaine abuse, PE, HIV, CAD s/p CABG, s/p stent placment, A-fib, CKDIII, iron deficiency anemia who presents with chest and toe pain. 10/14: s/p 2nd right toe amputation  **RD working remotely**  Patient currently consuming 30% of meals. Pt is drinking Ensure and Juven supplements. Per MD note, pt with some confusion today. Pt pending discharge to SNF once bed is offered.   Admission weight: 160 lbs. Current weight: 174 lbs. I/Os: -3.8L since admit UOP: 580 ml so far today  Medications: Ferrous sulfate tablet BID, Folic acid tablet daily, Prosight tablet daily, Thiamine tablet Labs reviewed: CBGs: 125-183 Low Na GFR: 53   Diet Order:   Diet Order            Diet Carb Modified Fluid consistency: Thin; Room service appropriate? Yes  Diet effective now              EDUCATION NEEDS:   No education needs have been identified at this time  Skin:  Skin Assessment: Skin Integrity Issues: Skin Integrity Issues:: Incisions Incisions: rt foot  Last BM:   10/12  Height:   Ht Readings from Last 1 Encounters:  07/21/19 5\' 8"  (1.727 m)    Weight:   Wt Readings from Last 1 Encounters:  07/23/19 79.1 kg    Ideal Body Weight:  70 kg  BMI:  Body mass index is 26.51 kg/m.  Estimated Nutritional Needs:   Kcal:  2200-2400  Protein:  110-120  Fluid:  >/= 2 L/day   Clayton Bibles, MS, RD, LDN Inpatient Clinical Dietitian Pager: [phone] After Hours Pager: [phone]

## 2019-07-23 NOTE — Progress Notes (Signed)
Subjective: 2 Days Post-Op Procedure(s) (LRB): RIGHT SECOND TOE AMPUTATION (Right) Patient reports pain as moderate.    Objective: Vital signs in last 24 hours: Temp:  [97.7 F (36.5 C)-98.6 F (37 C)] 98.6 F (37 C) (10/16 0430) Pulse Rate:  [79-86] 84 (10/16 0430) Resp:  [8-21] 20 (10/16 0430) BP: (120-135)/(77-88) 120/88 (10/16 0430) SpO2:  [98 %-100 %] 100 % (10/16 0430) Weight:  [79.1 kg] 79.1 kg (10/16 0430)  Intake/Output from previous day: 10/15 0701 - 10/16 0700 In: 1050.6 [P.O.:240; I.V.:510.6; IV Piggyback:300] Out: 2125 [Urine:2125] Intake/Output this shift: No intake/output data recorded.  Recent Labs    07/21/19 0316 07/22/19 0439 07/23/19 0248  HGB 8.3* 8.9* 9.2*   Recent Labs    07/22/19 0439 07/23/19 0248  WBC 6.6 6.5  RBC 3.40* 3.51*  HCT 26.9* 28.9*  PLT 209 188   Recent Labs    07/22/19 0439 07/23/19 0248  NA 131* 133*  K 4.8 4.9  CL 98 104  CO2 21* 20*  BUN 36* 40*  CREATININE 1.98* 1.52*  GLUCOSE 411* 271*  CALCIUM 8.7* 8.7*   No results for input(s): LABPT, INR in the last 72 hours.  Operative dressings in place over right foot and without drainage/bleeding on dressings.    Assessment/Plan: 2 Days Post-Op Procedure(s) (LRB): RIGHT SECOND TOE AMPUTATION (Right) S/P right 2nd ray amputation- POD#2- Needs to mobilize with PT, minimizing weight bearing as much as possible. Can weight bear thru heel.  Follow up in office in 1 week.     Erlinda Hong, PA-C 07/23/2019, 8:35 AM  [facility] [phone]

## 2019-07-23 NOTE — Progress Notes (Signed)
PROGRESS NOTE    Michael [name]  UYQ:[id number] DOB: [birth year] DOA: 07/16/2019 PCP: [name], PA-C   Brief Narrative:  Michael [name] is a 68 y.o. male with medical history significant of hypertension, hyperlipidemia, diabetes mellitus, asthma, GERD, gout, depression, cocaine abuse, PE not on anticoagulants, HIV, CAD, s/po of CABG, s/p of stent placement, atrial fibrillation not on anticoagulants, CKD stage III, iron deficiency anemia, who presented with chest pain.  Per patient, he has been having chest pain intermittently for more than a year since he had his CABG surgery but his pain has worsened in the last 2 days.  This was located at substernal area.  Aggravated with cough and deep breaths.  Patient recently had long distant travel to [place] a month ago.  No other symptoms.  He used cocaine day prior to admission.  Also had swelling and pain in right second toe.  Upon arrival to ED, pt was found to have troponin 102, WBC 6.4, negative COVID-19 test, renal function close to baseline, temperature normal, blood pressure 145/86, heart rate 72, oxygen saturation 99% on room air.  Chest x-ray is negative.  X-ray of right foot showed possible distal second toe osteomyelitis.  Patient was admitted under hospitalist service.  He was ruled out of MI with negative cardiac enzymes.  MRI of right foot confirmed right second toe osteomyelitis and possible abscess.  Patient was seen by Dr. [name] and underwent right second toe ray amputation on 07/21/2019.   Assessment & Plan:   Principal Problem:   Chest pain Active Problems:   HIV (human immunodeficiency virus infection) ([place])   Hyperlipidemia with target LDL less than 100   3-vessel CAD   Severe recurrent major depressive disorder with psychotic features ([name])   Cocaine use disorder, severe, dependence ([place])   Gout   Essential hypertension   Type II diabetes mellitus with renal manifestations ([name])   GERD (gastroesophageal  reflux disease)   S/P CABG (coronary artery bypass graft)   Chronic kidney disease, stage III (moderate)   Paroxysmal A-fib ([name])   Toe pain, right-second   Osteomyelitis ([name])   Abscess or cellulitis of toe, right  Osteomyelitis of right second toe: MRI confirms osteomyelitis throughout distal phalanx of second toe with some fluid collection surrounding distal phalanx, consistent with an abscess.  Blood cultures have remained negative. POD#2 s/P right second toe ray amputation.  Antibiotics stopped after 24 hours of amputation.  PT OT saw patient.  They recommend SNF.  Case manager working on that.  Chest pain with history of CAD/three-vessel disease: Cardiac enzymes negative.  His chest pain is chronic since he had CABG.  He has point tenderness.  EKG with no changes.  No further work-up indicated.   HIV: CD4 count 270 on 09/16/2018.  Continue home medications.  Follow patient.  Hyperlipidemia: Not on any medications.  Cocaine use disorder: Counseling regarding cessation provided.  Essential hypertension: Controlled.  Continue oral Cardizem.  Continue as needed hydralazine.  Type 2 diabetes mellitus: Blood sugar better but still slightly elevated.  Will increase Lantus to 15 units twice daily and continue SSI.  Paroxysmal A. Fib: Patient's cha2ds2 vascular 2 score is 4 but he is not on any anticoagulation.  Reason unknown. ?  Noncompliance.  I try to initiate conversation with him yesterday and I had long conversation with him again today.  He did know that he had atrial fibrillation but did not know what that exactly is.  He did tell me that  he was on " blood thinners" at 1 point in time but could not tell me the name and also when he was on that and that why he is not on any at this point in time.  He could not understand what the risk and benefits of a blood thinner is and what her thromboembolism is.  This raised doubts about his capacity to understand medical problems.  I then asked him  more questions to assess that, I asked him where he lives, he told me he is from [place].  I then asked him how he got to the [place], his answer was " visiting my sister in [place]".  He then changed his statement said he lives in [place].  I asked him if he lives in apartment, he said no he gave up his apartment when he went to [place].  He was able to tell me the address of his sister but he could not tell me address of his own in [place].  All of these suggest that he does not have capacity to understand medical issues that he has which makes it challenging to start him on any sort of anticoagulation without him understanding the risks and benefits and a strong history of noncompliance.  At this point in time, I do not think it will be safe for him to be on any anticoagulation now that he has been without any for a long time while he has been having active atrial fibrillation.  Anemia of chronic disease: His baseline seems to be between 9 and 10.  Currently at baseline.  Watch daily.  CKD stage III: Back to his baseline of around 1.5.  DVT prophylaxis: Heparin subcu Code Status: Full code Family Communication:  None present at bedside.  Plan of care discussed with patient in length and he verbalized understanding and agreed with it. Disposition Plan: PT OT recommended SNF.  Case manager working on that.  Estimated body mass index is 26.51 kg/m as calculated from the following:   Height as of this encounter: 5\' 8"  (1.727 m).   Weight as of this encounter: 79.1 kg.     Nutritional status:  Nutrition Problem: Increased nutrient needs Etiology: wound healing, chronic illness(osteomyelitis of right toe; HIV)   Signs/Symptoms: estimated needs   Interventions: Ensure Enlive (each supplement provides 350kcal and 20 grams of protein), MVI, Juven  Consultants:   Orthopedics  Procedures:   None  Antimicrobials:   IV Rocephin and Flagyl.  Completed on  07/22/2019   Subjective: Patient seen and examined.  Still with right foot pain but that is improving.  Still with intermittent chest pain but that is improving as well.  No new complaint.  Objective: Vitals:   07/22/19 2043 07/23/19 0301 07/23/19 0430 07/23/19 0959  BP: 133/83  120/88 (!) 150/90  Pulse: 79 79 84   Resp: 15 (!) 21 20   Temp: 98 F (36.7 C)  98.6 F (37 C)   TempSrc: Oral  Oral   SpO2: 98% 100% 100%   Weight:   79.1 kg   Height:        Intake/Output Summary (Last 24 hours) at 07/23/2019 1123 Last data filed at 07/23/2019 0547 Gross per 24 hour  Intake 1050.59 ml  Output 1900 ml  Net -849.41 ml   Filed Weights   07/21/19 0500 07/21/19 1018 07/23/19 0430  Weight: 75.7 kg 75.7 kg 79.1 kg    Examination:  General exam: Appears calm and comfortable  Respiratory system: Clear  to auscultation. Respiratory effort normal. Cardiovascular system: S1 & S2 heard, RRR. No JVD, murmurs, rubs, gallops or clicks. No pedal edema. Gastrointestinal system: Abdomen is nondistended, soft and nontender. No organomegaly or masses felt. Normal bowel sounds heard. Central nervous system: Alert and oriented. No focal neurological deficits. Extremities: Symmetric 5 x 5 power.  Dressing in the right foot. Skin: No rashes, lesions or ulcers.  Psychiatry: Judgement and insight appear poor.  Mood & affect appropriate.   Data Reviewed: I have personally reviewed following labs and imaging studies  CBC: Recent Labs  Lab 07/19/19 0613 07/20/19 0322 07/21/19 0316 07/22/19 0439 07/23/19 0248  WBC 4.1 3.4* 3.4* 6.6 6.5  NEUTROABS  --   --  1.8 5.6 4.5  HGB 9.4* 9.1* 8.3* 8.9* 9.2*  HCT 29.0* 28.0* 26.2* 26.9* 28.9*  MCV 78.8* 79.3* 79.9* 79.1* 82.3  PLT 178 172 170 209 563   Basic Metabolic Panel: Recent Labs  Lab 07/18/19 0510 07/20/19 0322 07/21/19 0316 07/22/19 0439 07/23/19 0248  NA 141 137 136 131* 133*  K 4.0 4.2 4.5 4.8 4.9  CL 111 105 105 98 104  CO2 20* 23  24 21* 20*  GLUCOSE 155* 227* 285* 411* 271*  BUN 18 25* 30* 36* 40*  CREATININE 1.42* 1.72* 1.66* 1.98* 1.52*  CALCIUM 8.5* 8.7* 8.7* 8.7* 8.7*   GFR: Estimated Creatinine Clearance: 44.4 mL/min (A) (by C-G formula based on SCr of 1.52 mg/dL (H)). Liver Function Tests: Recent Labs  Lab 07/21/19 0316 07/22/19 0439 07/23/19 0248  AST 40 67* 71*  ALT 23 40 48*  ALKPHOS 50 47 51  BILITOT 0.5 0.7 0.5  PROT 6.1* 6.4* 6.5  ALBUMIN 2.5* 2.8* 3.0*   No results for input(s): LIPASE, AMYLASE in the last 168 hours. No results for input(s): AMMONIA in the last 168 hours. Coagulation Profile: No results for input(s): INR, PROTIME in the last 168 hours. Cardiac Enzymes: No results for input(s): CKTOTAL, CKMB, CKMBINDEX, TROPONINI in the last 168 hours. BNP (last 3 results) No results for input(s): PROBNP in the last 8760 hours. HbA1C: No results for input(s): HGBA1C in the last 72 hours. CBG: Recent Labs  Lab 07/22/19 0814 07/22/19 1110 07/22/19 1544 07/22/19 2043 07/23/19 0803  GLUCAP 370* 292* 305* 310* 183*   Lipid Profile: No results for input(s): CHOL, HDL, LDLCALC, TRIG, CHOLHDL, LDLDIRECT in the last 72 hours. Thyroid Function Tests: No results for input(s): TSH, T4TOTAL, FREET4, T3FREE, THYROIDAB in the last 72 hours. Anemia Panel: No results for input(s): VITAMINB12, FOLATE, FERRITIN, TIBC, IRON, RETICCTPCT in the last 72 hours. Sepsis Labs: No results for input(s): PROCALCITON, LATICACIDVEN in the last 168 hours.  Recent Results (from the past 240 hour(s))  SARS CORONAVIRUS 2 (TAT 6-24 HRS) Nasopharyngeal Nasopharyngeal Swab     Status: None   Collection Time: 07/16/19  4:35 PM   Specimen: Nasopharyngeal Swab  Result Value Ref Range Status   SARS Coronavirus 2 NEGATIVE NEGATIVE Final    Comment: (NOTE) SARS-CoV-2 target nucleic acids are NOT DETECTED. The SARS-CoV-2 RNA is generally detectable in upper and lower respiratory specimens during the acute phase of  infection. Negative results do not preclude SARS-CoV-2 infection, do not rule out co-infections with other pathogens, and should not be used as the sole basis for treatment or other patient management decisions. Negative results must be combined with clinical observations, patient history, and epidemiological information. The expected result is Negative. Fact Sheet for Patients: SugarRoll.be Fact Sheet for Healthcare Providers: https://www.woods-mathews.com/ This test  is not yet approved or cleared by the [place] and  has been authorized for detection and/or diagnosis of SARS-CoV-2 by FDA under an Emergency Use Authorization (EUA). This EUA will remain  in effect (meaning this test can be used) for the duration of the COVID-19 declaration under Section 56 4(b)(1) of the Act, 21 U.S.C. section 360bbb-3(b)(1), unless the authorization is terminated or revoked sooner. Performed at [facility], [place] [address], [place], [place] [address]   Culture, blood (Routine X 2) w Reflex to ID Panel     Status: None   Collection Time: 07/17/19 12:43 AM   Specimen: BLOOD  Result Value Ref Range Status   Specimen Description BLOOD RIGHT ARM  Final   Special Requests   Final    BOTTLES DRAWN AEROBIC AND ANAEROBIC Blood Culture adequate volume   Culture   Final    NO GROWTH 5 DAYS Performed at [facility], [place] [address] [place], [place] [address]    Report Status 07/22/2019 FINAL  Final  Culture, blood (Routine X 2) w Reflex to ID Panel     Status: None   Collection Time: 07/17/19  1:00 AM   Specimen: BLOOD  Result Value Ref Range Status   Specimen Description BLOOD LEFT HAND  Final   Special Requests   Final    BOTTLES DRAWN AEROBIC AND ANAEROBIC Blood Culture adequate volume   Culture   Final    NO GROWTH 5 DAYS Performed at [facility], [place] [address], [place], [place] [address]    Report Status 07/22/2019 FINAL   Final  Surgical PCR screen     Status: None   Collection Time: 07/19/19  4:30 PM   Specimen: Nasal Mucosa; Nasal Swab  Result Value Ref Range Status   MRSA, PCR NEGATIVE NEGATIVE Final   Staphylococcus aureus NEGATIVE NEGATIVE Final    Comment: (NOTE) The Xpert SA Assay (FDA approved for NASAL specimens in patients 59 years of age and older), is one component of a comprehensive surveillance program. It is not intended to diagnose infection nor to guide or monitor treatment. Performed at [facility], [place] [address] [place],  [address]       Radiology Studies: No results found.  Scheduled Meds: . allopurinol  300 mg Oral Daily  . aspirin  81 mg Oral Daily  . colchicine  0.6 mg Oral Daily  . diltiazem  120 mg Oral Daily  . docusate sodium  100 mg Oral BID  . emtricitabine-rilpivir-tenofovir AF  1 tablet Oral Q breakfast  . famotidine  20 mg Oral Daily  . feeding supplement (ENSURE ENLIVE)  237 mL Oral BID BM  . feeding supplement  296 mL Oral Once  . ferrous sulfate  325 mg Oral BID WC  . folic acid  1 mg Oral Daily  . gabapentin  400 mg Oral BID  . heparin injection (subcutaneous)  5,000 Units Subcutaneous Q8H  . insulin aspart  0-9 Units Subcutaneous TID WC  . insulin glargine  15 Units Subcutaneous BID  . multivitamin  1 tablet Oral Daily  . mupirocin ointment  1 application Nasal BID  . nutrition supplement (JUVEN)  1 packet Oral BID BM  . risperiDONE  0.25 mg Oral BID  . sertraline  50 mg Oral Daily  . sodium chloride flush  3 mL Intravenous Once  . thiamine  100 mg Oral Daily   Continuous Infusions: . sodium chloride    .  lactated ringers 10 mL/hr at 07/21/19 1021  . methocarbamol (ROBAXIN) IV       LOS: 6 days   Time spent: 32 minutes   Darliss Cheney, MD [facility]  07/23/2019, 11:23 AM   To contact the attending provider between 7A-7P or the covering provider during after hours 7P-7A, please log into the web site [email or web address]  and use password [email or web address].

## 2019-07-23 NOTE — TOC Progression Note (Signed)
Transition of Care [facility]) - Progression Note    Patient Details  Name: Gurshaan Matsuoka MRN: [id number] Date of Birth: 03/26/1951  Transition of [facility] CM/SW Contact  Graves-Bigelow, Ocie Cornfield, RN Phone Number: 07/23/2019, 12:51 PM  Clinical Narrative: Received PASRR-and placed on FL2. CM awaiting bed offer. Patient has had six SNF's decline him 2/2 to hx of cocaine abuse. CM has reached out to the remaining four facilities to see if they can accept him. Once I have a bed offer, I will call [facility] to make them aware and I will then receive authorization. CM will continue to follow for transition of care needs.   Expected Discharge Plan: [place] Barriers to Discharge: Continued Medical Work up(Plan for toe amputation on Wednesday 07-21-19)  Expected Discharge Plan and Services Expected Discharge Plan: [place]   Discharge Planning Services: CM Consult   Living arrangements for the past 2 months: Hotel/Motel    Social Determinants of Health (SDOH) Interventions    Readmission Risk Interventions Readmission Risk Prevention Plan 07/19/2019  Transportation Screening Complete  Medication Review ([place]) Complete  HRI or Home Care Consult Complete  Palliative Care Screening Not Applicable  Some recent data might be hidden

## 2019-07-24 LAB — CBC WITH DIFFERENTIAL/PLATELET
Abs Immature Granulocytes: 0.05 10*3/uL (ref 0.00–0.07)
Basophils Absolute: 0 10*3/uL (ref 0.0–0.1)
Basophils Relative: 0 %
Eosinophils Absolute: 0.2 10*3/uL (ref 0.0–0.5)
Eosinophils Relative: 3 %
HCT: 29.5 % — ABNORMAL LOW (ref 39.0–52.0)
Hemoglobin: 9.4 g/dL — ABNORMAL LOW (ref 13.0–17.0)
Immature Granulocytes: 1 %
Lymphocytes Relative: 22 %
Lymphs Abs: 1.2 10*3/uL (ref 0.7–4.0)
MCH: 25.7 pg — ABNORMAL LOW (ref 26.0–34.0)
MCHC: 31.9 g/dL (ref 30.0–36.0)
MCV: 80.6 fL (ref 80.0–100.0)
Monocytes Absolute: 0.6 10*3/uL (ref 0.1–1.0)
Monocytes Relative: 12 %
Neutro Abs: 3.2 10*3/uL (ref 1.7–7.7)
Neutrophils Relative %: 62 %
Platelets: 227 10*3/uL (ref 150–400)
RBC: 3.66 MIL/uL — ABNORMAL LOW (ref 4.22–5.81)
RDW: 14.4 % (ref 11.5–15.5)
WBC: 5.2 10*3/uL (ref 4.0–10.5)
nRBC: 0 % (ref 0.0–0.2)

## 2019-07-24 LAB — BASIC METABOLIC PANEL
Anion gap: 9 (ref 5–15)
BUN: 24 mg/dL — ABNORMAL HIGH (ref 8–23)
CO2: 27 mmol/L (ref 22–32)
Calcium: 9.4 mg/dL (ref 8.9–10.3)
Chloride: 100 mmol/L (ref 98–111)
Creatinine, Ser: 1.37 mg/dL — ABNORMAL HIGH (ref 0.61–1.24)
GFR calc Af Amer: >60 mL/min (ref >60)
GFR calc non Af Amer: 52 mL/min — ABNORMAL LOW (ref >60)
Glucose, Bld: 135 mg/dL — ABNORMAL HIGH (ref 70–99)
Potassium: 4.3 mmol/L (ref 3.5–5.1)
Sodium: 136 mmol/L (ref 135–145)

## 2019-07-24 LAB — GLUCOSE, CAPILLARY
Glucose-Capillary: 104 mg/dL — ABNORMAL HIGH (ref 70–99)
Glucose-Capillary: 119 mg/dL — ABNORMAL HIGH (ref 70–99)
Glucose-Capillary: 239 mg/dL — ABNORMAL HIGH (ref 70–99)
Glucose-Capillary: 205 mg/dL — ABNORMAL HIGH (ref 70–99)

## 2019-07-24 NOTE — Progress Notes (Addendum)
Pt was having a BM when SVT happened.  Suppository was given earlier this shift.  Idolina Primer, RN

## 2019-07-24 NOTE — Progress Notes (Signed)
PROGRESS NOTE    Michael [name]  GDJ:[id number] DOB: [birth year] DOA: 07/16/2019 PCP: Michael Demark, PA-C   Brief Narrative:  Michael [name] is a 68 y.o. male with medical history significant of hypertension, hyperlipidemia, diabetes mellitus, asthma, GERD, gout, depression, cocaine abuse, PE not on anticoagulants, HIV, CAD, s/po of CABG, s/p of stent placement, atrial fibrillation not on anticoagulants, CKD stage III, iron deficiency anemia, who presented with chest pain.  Per patient, he has been having chest pain intermittently for more than a year since he had his CABG surgery but his pain has worsened in the last 2 days.  This was located at substernal area.  Aggravated with cough and deep breaths.  Patient recently had long distant travel to [place] a month ago.  No other symptoms.  He used cocaine day prior to admission.  Also had swelling and pain in right second toe.  Upon arrival to ED, pt was found to have troponin 102, WBC 6.4, negative COVID-19 test, renal function close to baseline, temperature normal, blood pressure 145/86, heart rate 72, oxygen saturation 99% on room air.  Chest x-ray is negative.  X-ray of right foot showed possible distal second toe osteomyelitis.  Patient was admitted under hospitalist service.  He was ruled out of MI with negative cardiac enzymes.  MRI of right foot confirmed right second toe osteomyelitis and possible abscess.  Patient was seen by Dr. [name] and underwent right second toe ray amputation on 07/21/2019.   Assessment & Plan:   Principal Problem:   Chest pain Active Problems:   HIV (human immunodeficiency virus infection) ([place])   Hyperlipidemia with target LDL less than 100   3-vessel CAD   Severe recurrent major depressive disorder with psychotic features ([name])   Cocaine use disorder, severe, dependence ([place])   Gout   Essential hypertension   Type II diabetes mellitus with renal manifestations ([name])   GERD (gastroesophageal  reflux disease)   S/P CABG (coronary artery bypass graft)   Chronic kidney disease, stage III (moderate)   Paroxysmal A-fib ([name])   Toe pain, right-second   Osteomyelitis ([name])   Abscess or cellulitis of toe, right  Osteomyelitis of right second toe: MRI confirms osteomyelitis throughout distal phalanx of second toe with some fluid collection surrounding distal phalanx, consistent with an abscess.  Blood cultures have remained negative. POD#3 s/P right second toe ray amputation.  Antibiotics stopped after 24 hours of amputation.  PT OT saw patient.  They recommend SNF.  Case manager working on that however this has been challenging due to his history of cocaine abuse.  Chest pain with history of CAD/three-vessel disease: Cardiac enzymes negative.  His chest pain is chronic since he had CABG.  He has point tenderness.  EKG with no changes.  No further work-up indicated.   HIV: CD4 count 270 on 09/16/2018.  Continue home medications.  Follow patient.  Hyperlipidemia: Not on any medications.  Cocaine use disorder: Counseling regarding cessation provided.  Essential hypertension: Controlled.  Continue oral Cardizem.  Continue as needed hydralazine.  Type 2 diabetes mellitus: Blood sugar control.  Continue Lantus 15 units daily and SSI.  Paroxysmal A. Fib: Patient's cha2ds2 vascular 2 score is 4 but he is not on any anticoagulation.  Reason unknown. ?  Noncompliance.  I tried to initiate conversation with him initially on 07/22/2019 and then again on 07/23/2019 I had long conversation with him.  He did know that he had atrial fibrillation but did not know what that  exactly is.  He did tell me that he was on " blood thinners" at 1 point in time but could not tell me the name and also when he was on that and that why he is not on any at this point in time.  He could not understand what the risk and benefits of a blood thinner is and what her thromboembolism is.  This raised doubts about his capacity  to understand medical problems.  I then asked him more questions to assess that, I asked him where he lives, he told me he is from [place].  I then asked him how he got to the [address], his answer was " visiting my sister in [place]".  He then changed his statement said he lives in [place].  I asked him if he lives in apartment, he said no he gave up his apartment when he went to [place].  He was able to tell me the address of his sister but he could not tell me address of his own in [place].  All of these suggest that he does not have capacity to understand medical issues that he has which makes it challenging to start him on any sort of anticoagulation without him understanding the risks and benefits and a strong history of noncompliance.  At this point in time, I do not think it will be safe for him to be on any anticoagulation now that he has been without any for a long time while he has been having active atrial fibrillation.  Anemia of chronic disease: His baseline seems to be between 9 and 10.  Currently at baseline.  Watch daily.  CKD stage III: Back to his baseline of around 1.5.  DVT prophylaxis: Heparin subcu Code Status: Full code Family Communication:  None present at bedside.  Plan of care discussed with patient in length and he verbalized understanding and agreed with it. Disposition Plan: PT OT recommended SNF.  Case manager working on that.  Estimated body mass index is 26.21 kg/m as calculated from the following:   Height as of this encounter: 5\' 8"  (1.727 m).   Weight as of this encounter: 78.2 kg.     Nutritional status:  Nutrition Problem: Increased nutrient needs Etiology: wound healing, chronic illness(osteomyelitis of right toe; HIV)   Signs/Symptoms: estimated needs   Interventions: Ensure Enlive (each supplement provides 350kcal and 20 grams of protein), MVI, Juven  Consultants:   Orthopedics  Procedures:   None  Antimicrobials:   IV  Rocephin and Flagyl.  Completed on 07/22/2019   Subjective: Seen and examined.  Continues to complain of right foot pain but no chest pain today.  No other complaint.   Objective: Vitals:   07/23/19 2121 07/24/19 0421 07/24/19 0424 07/24/19 0518  BP: (!) 161/82 130/73    Pulse: 87 80    Resp: 19 20  15   Temp:  98.4 F (36.9 C)    TempSrc:  Oral    SpO2: 100% 96%    Weight:   78.2 kg   Height:        Intake/Output Summary (Last 24 hours) at 07/24/2019 0913 Last data filed at 07/24/2019 0400 Gross per 24 hour  Intake 480 ml  Output 950 ml  Net -470 ml   Filed Weights   07/21/19 1018 07/23/19 0430 07/24/19 0424  Weight: 75.7 kg 79.1 kg 78.2 kg    Examination:  General exam: Appears calm and comfortable  Respiratory system: Clear to auscultation. Respiratory effort normal.  Point tenderness at the sternum. Cardiovascular system: S1 & S2 heard, RRR. No JVD, murmurs, rubs, gallops or clicks. No pedal edema. Gastrointestinal system: Abdomen is nondistended, soft and nontender. No organomegaly or masses felt. Normal bowel sounds heard. Central nervous system: Alert and oriented. No focal neurological deficits. Extremities: Symmetric 5 x 5 power. Skin: No rashes, lesions or ulcers.  Dressing in the right foot. Psychiatry: Judgement and insight appear poor. Mood & affect appropriate.   Data Reviewed: I have personally reviewed following labs and imaging studies  CBC: Recent Labs  Lab 07/20/19 0322 07/21/19 0316 07/22/19 0439 07/23/19 0248 07/24/19 0411  WBC 3.4* 3.4* 6.6 6.5 5.2  NEUTROABS  --  1.8 5.6 4.5 3.2  HGB 9.1* 8.3* 8.9* 9.2* 9.4*  HCT 28.0* 26.2* 26.9* 28.9* 29.5*  MCV 79.3* 79.9* 79.1* 82.3 80.6  PLT 172 170 209 188 382   Basic Metabolic Panel: Recent Labs  Lab 07/18/19 0510 07/20/19 0322 07/21/19 0316 07/22/19 0439 07/23/19 0248  NA 141 137 136 131* 133*  K 4.0 4.2 4.5 4.8 4.9  CL 111 105 105 98 104  CO2 20* 23 24 21* 20*  GLUCOSE 155* 227*  285* 411* 271*  BUN 18 25* 30* 36* 40*  CREATININE 1.42* 1.72* 1.66* 1.98* 1.52*  CALCIUM 8.5* 8.7* 8.7* 8.7* 8.7*   GFR: Estimated Creatinine Clearance: 44.4 mL/min (A) (by C-G formula based on SCr of 1.52 mg/dL (H)). Liver Function Tests: Recent Labs  Lab 07/21/19 0316 07/22/19 0439 07/23/19 0248  AST 40 67* 71*  ALT 23 40 48*  ALKPHOS 50 47 51  BILITOT 0.5 0.7 0.5  PROT 6.1* 6.4* 6.5  ALBUMIN 2.5* 2.8* 3.0*   No results for input(s): LIPASE, AMYLASE in the last 168 hours. No results for input(s): AMMONIA in the last 168 hours. Coagulation Profile: No results for input(s): INR, PROTIME in the last 168 hours. Cardiac Enzymes: No results for input(s): CKTOTAL, CKMB, CKMBINDEX, TROPONINI in the last 168 hours. BNP (last 3 results) No results for input(s): PROBNP in the last 8760 hours. HbA1C: No results for input(s): HGBA1C in the last 72 hours. CBG: Recent Labs  Lab 07/23/19 0803 07/23/19 1216 07/23/19 2116 07/23/19 2329 07/24/19 0740  GLUCAP 183* 125* 74 201* 104*   Lipid Profile: No results for input(s): CHOL, HDL, LDLCALC, TRIG, CHOLHDL, LDLDIRECT in the last 72 hours. Thyroid Function Tests: No results for input(s): TSH, T4TOTAL, FREET4, T3FREE, THYROIDAB in the last 72 hours. Anemia Panel: No results for input(s): VITAMINB12, FOLATE, FERRITIN, TIBC, IRON, RETICCTPCT in the last 72 hours. Sepsis Labs: No results for input(s): PROCALCITON, LATICACIDVEN in the last 168 hours.  Recent Results (from the past 240 hour(s))  SARS CORONAVIRUS 2 (TAT 6-24 HRS) Nasopharyngeal Nasopharyngeal Swab     Status: None   Collection Time: 07/16/19  4:35 PM   Specimen: Nasopharyngeal Swab  Result Value Ref Range Status   SARS Coronavirus 2 NEGATIVE NEGATIVE Final    Comment: (NOTE) SARS-CoV-2 target nucleic acids are NOT DETECTED. The SARS-CoV-2 RNA is generally detectable in upper and lower respiratory specimens during the acute phase of infection. Negative results do not  preclude SARS-CoV-2 infection, do not rule out co-infections with other pathogens, and should not be used as the sole basis for treatment or other patient management decisions. Negative results must be combined with clinical observations, patient history, and epidemiological information. The expected result is Negative. Fact Sheet for Patients: SugarRoll.be Fact Sheet for Healthcare Providers: https://www.woods-mathews.com/ This test is not yet approved  or cleared by the [place] and  has been authorized for detection and/or diagnosis of SARS-CoV-2 by FDA under an Emergency Use Authorization (EUA). This EUA will remain  in effect (meaning this test can be used) for the duration of the COVID-19 declaration under Section 56 4(b)(1) of the Act, 21 U.S.C. section 360bbb-3(b)(1), unless the authorization is terminated or revoked sooner. Performed at [facility], [place] [address], [place], [place] [address]   Culture, blood (Routine X 2) w Reflex to ID Panel     Status: None   Collection Time: 07/17/19 12:43 AM   Specimen: BLOOD  Result Value Ref Range Status   Specimen Description BLOOD RIGHT ARM  Final   Special Requests   Final    BOTTLES DRAWN AEROBIC AND ANAEROBIC Blood Culture adequate volume   Culture   Final    NO GROWTH 5 DAYS Performed at [facility], [place] [address], [place], [place] [address]    Report Status 07/22/2019 FINAL  Final  Culture, blood (Routine X 2) w Reflex to ID Panel     Status: None   Collection Time: 07/17/19  1:00 AM   Specimen: BLOOD  Result Value Ref Range Status   Specimen Description BLOOD LEFT HAND  Final   Special Requests   Final    BOTTLES DRAWN AEROBIC AND ANAEROBIC Blood Culture adequate volume   Culture   Final    NO GROWTH 5 DAYS Performed at [facility], [place] [address] [place], [place] [address]    Report Status 07/22/2019 FINAL  Final  Surgical PCR screen     Status:  None   Collection Time: 07/19/19  4:30 PM   Specimen: Nasal Mucosa; Nasal Swab  Result Value Ref Range Status   MRSA, PCR NEGATIVE NEGATIVE Final   Staphylococcus aureus NEGATIVE NEGATIVE Final    Comment: (NOTE) The Xpert SA Assay (FDA approved for NASAL specimens in patients 39 years of age and older), is one component of a comprehensive surveillance program. It is not intended to diagnose infection nor to guide or monitor treatment. Performed at [facility], [place] [address], [place],  [address]       Radiology Studies: No results found.  Scheduled Meds: . allopurinol  300 mg Oral Daily  . aspirin  81 mg Oral Daily  . colchicine  0.6 mg Oral Daily  . diltiazem  120 mg Oral Daily  . docusate sodium  100 mg Oral BID  . emtricitabine-rilpivir-tenofovir AF  1 tablet Oral Q breakfast  . famotidine  20 mg Oral Daily  . feeding supplement (ENSURE ENLIVE)  237 mL Oral BID BM  . feeding supplement  296 mL Oral Once  . ferrous sulfate  325 mg Oral BID WC  . folic acid  1 mg Oral Daily  . gabapentin  400 mg Oral BID  . heparin injection (subcutaneous)  5,000 Units Subcutaneous Q8H  . insulin aspart  0-9 Units Subcutaneous TID WC  . insulin glargine  15 Units Subcutaneous Daily  . multivitamin  1 tablet Oral Daily  . mupirocin ointment  1 application Nasal BID  . nutrition supplement (JUVEN)  1 packet Oral BID BM  . risperiDONE  0.25 mg Oral BID  . sertraline  50 mg Oral Daily  . sodium chloride flush  3 mL Intravenous Once  . thiamine  100 mg Oral Daily   Continuous Infusions: . sodium chloride    . lactated ringers  10 mL/hr at 07/21/19 1021  . methocarbamol (ROBAXIN) IV       LOS: 7 days   Time spent: 28 minutes   Darliss Cheney, MD [facility]  07/24/2019, 9:13 AM   To contact the attending provider between 7A-7P or the covering provider during after hours 7P-7A, please log into the web site [email or web address] and use password [email or web address].

## 2019-07-25 LAB — GLUCOSE, CAPILLARY
Glucose-Capillary: 81 mg/dL (ref 70–99)
Glucose-Capillary: 203 mg/dL — ABNORMAL HIGH (ref 70–99)
Glucose-Capillary: 303 mg/dL — ABNORMAL HIGH (ref 70–99)
Glucose-Capillary: 166 mg/dL — ABNORMAL HIGH (ref 70–99)
Glucose-Capillary: 294 mg/dL — ABNORMAL HIGH (ref 70–99)

## 2019-07-25 LAB — CBC WITH DIFFERENTIAL/PLATELET
Abs Immature Granulocytes: 0.06 10*3/uL (ref 0.00–0.07)
Basophils Absolute: 0 10*3/uL (ref 0.0–0.1)
Basophils Relative: 0 %
Eosinophils Absolute: 0 10*3/uL (ref 0.0–0.5)
Eosinophils Relative: 0 %
HCT: 34.3 % — ABNORMAL LOW (ref 39.0–52.0)
Hemoglobin: 11 g/dL — ABNORMAL LOW (ref 13.0–17.0)
Immature Granulocytes: 1 %
Lymphocytes Relative: 11 %
Lymphs Abs: 1.2 10*3/uL (ref 0.7–4.0)
MCH: 25.7 pg — ABNORMAL LOW (ref 26.0–34.0)
MCHC: 32.1 g/dL (ref 30.0–36.0)
MCV: 80.1 fL (ref 80.0–100.0)
Monocytes Absolute: 0.8 10*3/uL (ref 0.1–1.0)
Monocytes Relative: 8 %
Neutro Abs: 8.2 10*3/uL — ABNORMAL HIGH (ref 1.7–7.7)
Neutrophils Relative %: 80 %
Platelets: 225 10*3/uL (ref 150–400)
RBC: 4.28 MIL/uL (ref 4.22–5.81)
RDW: 14.7 % (ref 11.5–15.5)
WBC: 10.3 10*3/uL (ref 4.0–10.5)
nRBC: 0 % (ref 0.0–0.2)

## 2019-07-25 LAB — BASIC METABOLIC PANEL
Anion gap: 12 (ref 5–15)
BUN: 27 mg/dL — ABNORMAL HIGH (ref 8–23)
CO2: 24 mmol/L (ref 22–32)
Calcium: 9.5 mg/dL (ref 8.9–10.3)
Chloride: 98 mmol/L (ref 98–111)
Creatinine, Ser: 1.38 mg/dL — ABNORMAL HIGH (ref 0.61–1.24)
GFR calc Af Amer: >60 mL/min (ref >60)
GFR calc non Af Amer: 52 mL/min — ABNORMAL LOW (ref >60)
Glucose, Bld: 208 mg/dL — ABNORMAL HIGH (ref 70–99)
Potassium: 4.7 mmol/L (ref 3.5–5.1)
Sodium: 134 mmol/L — ABNORMAL LOW (ref 135–145)

## 2019-07-25 NOTE — Progress Notes (Signed)
Patient had several BM throughout the night.  Patient oriented x 1, was able to walk to the bathroom but gait was not steady.  Transition to [facility] but started getting too weak to pivot from bed to [facility]  Patient having incontinence episodes in bed. I will keep monitoring patient.

## 2019-07-25 NOTE — Progress Notes (Addendum)
PROGRESS NOTE    Michael [name]  STM:[id number] DOB: November 21, 1950 DOA: 07/16/2019 PCP: [name], PA-C   Brief Narrative:  Michael [name] is a 68 y.o. male with medical history significant of hypertension, hyperlipidemia, diabetes mellitus, asthma, GERD, gout, depression, cocaine abuse, PE not on anticoagulants, HIV, CAD, s/po of CABG, s/p of stent placement, atrial fibrillation not on anticoagulants, CKD stage III, iron deficiency anemia, who presented with chest pain.  Per patient, he has been having chest pain intermittently for more than a year since he had his CABG surgery but his pain has worsened in the last 2 days.  This was located at substernal area.  Aggravated with cough and deep breaths.  Patient recently had long distant travel to [place] a month ago.  No other symptoms.  He used cocaine day prior to admission.  Also had swelling and pain in right second toe.  Upon arrival to ED, pt was found to have troponin 102, WBC 6.4, negative COVID-19 test, renal function close to baseline, temperature normal, blood pressure 145/86, heart rate 72, oxygen saturation 99% on room air.  Chest x-ray is negative.  X-ray of right foot showed possible distal second toe osteomyelitis.  Patient was admitted under hospitalist service.  He was ruled out of MI with negative cardiac enzymes.  MRI of right foot confirmed right second toe osteomyelitis and possible abscess.  Patient was seen by Dr. [name] and underwent right second toe ray amputation on 07/21/2019.  Patient was evaluated by PT OT and they recommended SNF.  Case manager on board.  Assessment & Plan:   Principal Problem:   Chest pain Active Problems:   HIV (human immunodeficiency virus infection) ([place])   Hyperlipidemia with target LDL less than 100   3-vessel CAD   Severe recurrent major depressive disorder with psychotic features ([name])   Cocaine use disorder, severe, dependence ([place])   Gout   Essential hypertension    Type II diabetes mellitus with renal manifestations ([name])   GERD (gastroesophageal reflux disease)   S/P CABG (coronary artery bypass graft)   Chronic kidney disease, stage III (moderate)   Paroxysmal A-fib ([name])   Toe pain, right-second   Osteomyelitis ([name])   Abscess or cellulitis of toe, right  Osteomyelitis of right second toe: MRI confirms osteomyelitis throughout distal phalanx of second toe with some fluid collection surrounding distal phalanx, consistent with an abscess.  Blood cultures have remained negative. POD#3 s/P right second toe ray amputation.  Antibiotics stopped after 24 hours of amputation.  PT OT saw patient.  They recommend SNF.  Case manager working on that however this has been challenging due to his history of cocaine abuse.  Chest pain with history of CAD/three-vessel disease: Cardiac enzymes negative.  His chest pain is chronic since he had CABG.  He has point tenderness.  EKG with no changes.  No further work-up indicated.   HIV: CD4 count 270 on 09/16/2018.  Continue home medications.  Follow patient.  Hyperlipidemia: Not on any medications.  Cocaine use disorder: Counseling regarding cessation provided.  Essential hypertension: Controlled.  Continue oral Cardizem.  Continue as needed hydralazine.  Type 2 diabetes mellitus: Blood sugar slightly elevated today.  Continue Lantus 15 units daily and SSI and watch for next 24 hours.  Paroxysmal A. Fib: Patient's cha2ds2 vascular 2 score is 4 but he is not on any anticoagulation.  Reason unknown. ?  Noncompliance.  I tried to initiate conversation with him initially on 07/22/2019 and then again  on 07/23/2019 I had long conversation with him.  He did know that he had atrial fibrillation but did not know what that exactly is.  He did tell me that he was on " blood thinners" at 1 point in time but could not tell me the name and also when he was on that and that why he is not on any at this point in time.  He could not  understand what the risk and benefits of a blood thinner is and what her thromboembolism is.  This raised doubts about his capacity to understand medical problems.  I then asked him more questions to assess that, I asked him where he lives, he told me he is from [place].  I then asked him how he got to the [place], his answer was " visiting my sister in [place]".  He then changed his statement said he lives in [place].  I asked him if he lives in apartment, he said no he gave up his apartment when he went to [place].  He was able to tell me the address of his sister but he could not tell me address of his own in [place].  All of these suggest that he does not have capacity to understand medical issues that he has which makes it challenging to start him on any sort of anticoagulation without him understanding the risks and benefits and a strong history of noncompliance.  At this point in time, I do not think it will be safe for him to be on any anticoagulation now that he has been without any for a long time while he has been having active atrial fibrillation.  Anemia of chronic disease: His baseline seems to be between 9 and 10.  Currently at baseline.  Watch daily.  CKD stage III: Back to his baseline of around 1.5.  Diarrhea: he has had couple of loose bowel movements since yesterday.  We will watch for resolution on its own.  If needed we will consider antimotility agents.  DVT prophylaxis: Heparin subcu Code Status: Full code Family Communication:  None present at bedside.  Plan of care discussed with patient in length and he verbalized understanding and agreed with it. Disposition Plan: PT OT recommended SNF.  Case manager working on that.  Estimated body mass index is 25.21 kg/m as calculated from the following:   Height as of this encounter: 5\' 8"  (1.727 m).   Weight as of this encounter: 75.2 kg.     Nutritional status:  Nutrition Problem: Increased nutrient  needs Etiology: wound healing, chronic illness(osteomyelitis of right toe; HIV)   Signs/Symptoms: estimated needs   Interventions: Ensure Enlive (each supplement provides 350kcal and 20 grams of protein), MVI, Juven  Consultants:   Orthopedics  Procedures:   None  Antimicrobials:   IV Rocephin and Flagyl.  Completed on 07/22/2019   Subjective: Patient seen and examined.  Continues to complain of foot pain.  Has had couple of loose bowel movements since yesterday.  No other complaint.  Objective: Vitals:   07/24/19 1700 07/24/19 1800 07/24/19 2030 07/25/19 0530  BP:   (!) 159/68 (!) 164/101  Pulse:   89   Resp: 11 18 14 14   Temp:   97.8 F (36.6 C) 99.4 F (37.4 C)  TempSrc:   Oral Oral  SpO2:   100% 100%  Weight:    75.2 kg  Height:        Intake/Output Summary (Last 24 hours) at 07/25/2019 0827 Last data  filed at 07/25/2019 0539 Gross per 24 hour  Intake 240 ml  Output 1100 ml  Net -860 ml   Filed Weights   07/23/19 0430 07/24/19 0424 07/25/19 0530  Weight: 79.1 kg 78.2 kg 75.2 kg    Examination:  General exam: Appears calm and comfortable  Respiratory system: Clear to auscultation. Respiratory effort normal.  Point tenderness at the sternal area. Cardiovascular system: S1 & S2 heard, RRR. No JVD, murmurs, rubs, gallops or clicks. No pedal edema. Gastrointestinal system: Abdomen is nondistended, soft and nontender. No organomegaly or masses felt. Normal bowel sounds heard. Central nervous system: Alert and oriented. No focal neurological deficits. Extremities: Symmetric 5 x 5 power.  Dressing in the right foot. Skin: No rashes, lesions or ulcers.  Psychiatry: Judgement and insight appear poor. Mood & affect flat.  Data Reviewed: I have personally reviewed following labs and imaging studies  CBC: Recent Labs  Lab 07/21/19 0316 07/22/19 0439 07/23/19 0248 07/24/19 0411 07/25/19 0452  WBC 3.4* 6.6 6.5 5.2 10.3  NEUTROABS 1.8 5.6 4.5 3.2 8.2*   HGB 8.3* 8.9* 9.2* 9.4* 11.0*  HCT 26.2* 26.9* 28.9* 29.5* 34.3*  MCV 79.9* 79.1* 82.3 80.6 80.1  PLT 170 209 188 227 888   Basic Metabolic Panel: Recent Labs  Lab 07/21/19 0316 07/22/19 0439 07/23/19 0248 07/24/19 1134 07/25/19 0452  NA 136 131* 133* 136 134*  K 4.5 4.8 4.9 4.3 4.7  CL 105 98 104 100 98  CO2 24 21* 20* 27 24  GLUCOSE 285* 411* 271* 135* 208*  BUN 30* 36* 40* 24* 27*  CREATININE 1.66* 1.98* 1.52* 1.37* 1.38*  CALCIUM 8.7* 8.7* 8.7* 9.4 9.5   GFR: Estimated Creatinine Clearance: 48.9 mL/min (A) (by C-G formula based on SCr of 1.38 mg/dL (H)). Liver Function Tests: Recent Labs  Lab 07/21/19 0316 07/22/19 0439 07/23/19 0248  AST 40 67* 71*  ALT 23 40 48*  ALKPHOS 50 47 51  BILITOT 0.5 0.7 0.5  PROT 6.1* 6.4* 6.5  ALBUMIN 2.5* 2.8* 3.0*   No results for input(s): LIPASE, AMYLASE in the last 168 hours. No results for input(s): AMMONIA in the last 168 hours. Coagulation Profile: No results for input(s): INR, PROTIME in the last 168 hours. Cardiac Enzymes: No results for input(s): CKTOTAL, CKMB, CKMBINDEX, TROPONINI in the last 168 hours. BNP (last 3 results) No results for input(s): PROBNP in the last 8760 hours. HbA1C: No results for input(s): HGBA1C in the last 72 hours. CBG: Recent Labs  Lab 07/24/19 0740 07/24/19 1104 07/24/19 1622 07/24/19 2215 07/25/19 0811  GLUCAP 104* 119* 239* 205* 203*   Lipid Profile: No results for input(s): CHOL, HDL, LDLCALC, TRIG, CHOLHDL, LDLDIRECT in the last 72 hours. Thyroid Function Tests: No results for input(s): TSH, T4TOTAL, FREET4, T3FREE, THYROIDAB in the last 72 hours. Anemia Panel: No results for input(s): VITAMINB12, FOLATE, FERRITIN, TIBC, IRON, RETICCTPCT in the last 72 hours. Sepsis Labs: No results for input(s): PROCALCITON, LATICACIDVEN in the last 168 hours.  Recent Results (from the past 240 hour(s))  SARS CORONAVIRUS 2 (TAT 6-24 HRS) Nasopharyngeal Nasopharyngeal Swab     Status: None    Collection Time: 07/16/19  4:35 PM   Specimen: Nasopharyngeal Swab  Result Value Ref Range Status   SARS Coronavirus 2 NEGATIVE NEGATIVE Final    Comment: (NOTE) SARS-CoV-2 target nucleic acids are NOT DETECTED. The SARS-CoV-2 RNA is generally detectable in upper and lower respiratory specimens during the acute phase of infection. Negative results do not preclude SARS-CoV-2  infection, do not rule out co-infections with other pathogens, and should not be used as the sole basis for treatment or other patient management decisions. Negative results must be combined with clinical observations, patient history, and epidemiological information. The expected result is Negative. Fact Sheet for Patients: SugarRoll.be Fact Sheet for Healthcare Providers: https://www.woods-mathews.com/ This test is not yet approved or cleared by the [place] FDA and  has been authorized for detection and/or diagnosis of SARS-CoV-2 by FDA under an Emergency Use Authorization (EUA). This EUA will remain  in effect (meaning this test can be used) for the duration of the COVID-19 declaration under Section 56 4(b)(1) of the Act, 21 U.S.C. section 360bbb-3(b)(1), unless the authorization is terminated or revoked sooner. Performed at [facility], [place] [address] [place], [place] [address]   Culture, blood (Routine X 2) w Reflex to ID Panel     Status: None   Collection Time: 07/17/19 12:43 AM   Specimen: BLOOD  Result Value Ref Range Status   Specimen Description BLOOD RIGHT ARM  Final   Special Requests   Final    BOTTLES DRAWN AEROBIC AND ANAEROBIC Blood Culture adequate volume   Culture   Final    NO GROWTH 5 DAYS Performed at [facility], [place] [address] [place], [place] [address]    Report Status 07/22/2019 FINAL  Final  Culture, blood (Routine X 2) w Reflex to ID Panel     Status: None   Collection Time: 07/17/19  1:00 AM   Specimen: BLOOD   Result Value Ref Range Status   Specimen Description BLOOD LEFT HAND  Final   Special Requests   Final    BOTTLES DRAWN AEROBIC AND ANAEROBIC Blood Culture adequate volume   Culture   Final    NO GROWTH 5 DAYS Performed at [facility], [place] [address], [place], [place] [address]    Report Status 07/22/2019 FINAL  Final  Surgical PCR screen     Status: None   Collection Time: 07/19/19  4:30 PM   Specimen: Nasal Mucosa; Nasal Swab  Result Value Ref Range Status   MRSA, PCR NEGATIVE NEGATIVE Final   Staphylococcus aureus NEGATIVE NEGATIVE Final    Comment: (NOTE) The Xpert SA Assay (FDA approved for NASAL specimens in patients 61 years of age and older), is one component of a comprehensive surveillance program. It is not intended to diagnose infection nor to guide or monitor treatment. Performed at [facility], [place] [address] [place], [place] [address]       Radiology Studies: No results found.  Scheduled Meds: . allopurinol  300 mg Oral Daily  . aspirin  81 mg Oral Daily  . colchicine  0.6 mg Oral Daily  . diltiazem  120 mg Oral Daily  . docusate sodium  100 mg Oral BID  . emtricitabine-rilpivir-tenofovir AF  1 tablet Oral Q breakfast  . famotidine  20 mg Oral Daily  . feeding supplement (ENSURE ENLIVE)  237 mL Oral BID BM  . feeding supplement  296 mL Oral Once  . ferrous sulfate  325 mg Oral BID WC  . folic acid  1 mg Oral Daily  . gabapentin  400 mg Oral BID  . heparin injection (subcutaneous)  5,000 Units Subcutaneous Q8H  . insulin aspart  0-9 Units Subcutaneous TID WC  . insulin glargine  15 Units Subcutaneous Daily  . multivitamin  1 tablet Oral Daily  . nutrition supplement (JUVEN)  1  packet Oral BID BM  . risperiDONE  0.25 mg Oral BID  . sertraline  50 mg Oral Daily  . sodium chloride flush  3 mL Intravenous Once  . thiamine  100 mg Oral Daily   Continuous Infusions: . sodium chloride    . lactated ringers 10 mL/hr at 07/21/19 1021  .  methocarbamol (ROBAXIN) IV       LOS: 8 days   Time spent: 29 minutes   Darliss Cheney, MD [facility]  07/25/2019, 8:27 AM   To contact the attending provider between 7A-7P or the covering provider during after hours 7P-7A, please log into the web site [email or web address] and use password [email or web address].

## 2019-07-26 LAB — BASIC METABOLIC PANEL
Anion gap: 9 (ref 5–15)
BUN: 32 mg/dL — ABNORMAL HIGH (ref 8–23)
CO2: 26 mmol/L (ref 22–32)
Calcium: 8.9 mg/dL (ref 8.9–10.3)
Chloride: 101 mmol/L (ref 98–111)
Creatinine, Ser: 1.55 mg/dL — ABNORMAL HIGH (ref 0.61–1.24)
GFR calc Af Amer: 52 mL/min — ABNORMAL LOW (ref >60)
GFR calc non Af Amer: 45 mL/min — ABNORMAL LOW (ref >60)
Glucose, Bld: 272 mg/dL — ABNORMAL HIGH (ref 70–99)
Potassium: 4.2 mmol/L (ref 3.5–5.1)
Sodium: 136 mmol/L (ref 135–145)

## 2019-07-26 LAB — GLUCOSE, CAPILLARY
Glucose-Capillary: 260 mg/dL — ABNORMAL HIGH (ref 70–99)
Glucose-Capillary: 265 mg/dL — ABNORMAL HIGH (ref 70–99)
Glucose-Capillary: 326 mg/dL — ABNORMAL HIGH (ref 70–99)
Glucose-Capillary: 238 mg/dL — ABNORMAL HIGH (ref 70–99)

## 2019-07-26 LAB — CBC WITH DIFFERENTIAL/PLATELET
Abs Immature Granulocytes: 0.04 10*3/uL (ref 0.00–0.07)
Basophils Absolute: 0 10*3/uL (ref 0.0–0.1)
Basophils Relative: 0 %
Eosinophils Absolute: 0.1 10*3/uL (ref 0.0–0.5)
Eosinophils Relative: 1 %
HCT: 28.2 % — ABNORMAL LOW (ref 39.0–52.0)
Hemoglobin: 8.9 g/dL — ABNORMAL LOW (ref 13.0–17.0)
Immature Granulocytes: 1 %
Lymphocytes Relative: 17 %
Lymphs Abs: 1 10*3/uL (ref 0.7–4.0)
MCH: 25.4 pg — ABNORMAL LOW (ref 26.0–34.0)
MCHC: 31.6 g/dL (ref 30.0–36.0)
MCV: 80.3 fL (ref 80.0–100.0)
Monocytes Absolute: 0.5 10*3/uL (ref 0.1–1.0)
Monocytes Relative: 9 %
Neutro Abs: 4.4 10*3/uL (ref 1.7–7.7)
Neutrophils Relative %: 72 %
Platelets: 243 10*3/uL (ref 150–400)
RBC: 3.51 MIL/uL — ABNORMAL LOW (ref 4.22–5.81)
RDW: 14.4 % (ref 11.5–15.5)
WBC: 6.1 10*3/uL (ref 4.0–10.5)
nRBC: 0 % (ref 0.0–0.2)

## 2019-07-26 MED ORDER — INSULIN GLARGINE 100 UNIT/ML ~~LOC~~ SOLN
20.0000 [IU] | Freq: Every day | SUBCUTANEOUS | Status: DC
  Administered 2019-07-26 – 2019-07-27 (×2): 20 [IU] via SUBCUTANEOUS
  Filled 2019-07-26 (×2): qty 0.2

## 2019-07-26 NOTE — Care Management Important Message (Signed)
Important Message  Patient Details  Name: Michael [name] MRN: [id number] Date of Birth: 15-Mar-1951   Medicare Important Message Given:  Yes     Shelda Altes 07/26/2019, 1:37 PM

## 2019-07-26 NOTE — Progress Notes (Signed)
PROGRESS NOTE    Michael [name]  OEV:[id number] DOB: [birth year] DOA: 07/16/2019 PCP: [name], PA-C   Brief Narrative:  Michael [name] is a 68 y.o. male with medical history significant of hypertension, hyperlipidemia, diabetes mellitus, asthma, GERD, gout, depression, cocaine abuse, PE not on anticoagulants, HIV, CAD, s/po of CABG, s/p of stent placement, atrial fibrillation not on anticoagulants, CKD stage III, iron deficiency anemia, who presented with chest pain.  Per patient, he has been having chest pain intermittently for more than a year since he had his CABG surgery but his pain has worsened in the last 2 days.  This was located at substernal area.  Aggravated with cough and deep breaths.  Patient recently had long distant travel to [place] a month ago.  No other symptoms.  He used cocaine day prior to admission.  Also had swelling and pain in right second toe.  Upon arrival to ED, pt was found to have troponin 102, WBC 6.4, negative COVID-19 test, renal function close to baseline, temperature normal, blood pressure 145/86, heart rate 72, oxygen saturation 99% on room air.  Chest x-ray is negative.  X-ray of right foot showed possible distal second toe osteomyelitis.  Patient was admitted under hospitalist service.  He was ruled out of MI with negative cardiac enzymes.  MRI of right foot confirmed right second toe osteomyelitis and possible abscess.  Patient was seen by Dr. [name] and underwent right second toe ray amputation on 07/21/2019.  Patient was evaluated by PT OT and they recommended SNF.  Case manager on board.  Assessment & Plan:   Principal Problem:   Chest pain Active Problems:   HIV (human immunodeficiency virus infection) ([place])   Hyperlipidemia with target LDL less than 100   3-vessel CAD   Severe recurrent major depressive disorder with psychotic features ([name])   Cocaine use disorder, severe, dependence ([place])   Gout   Essential hypertension  Type II diabetes mellitus with renal manifestations ([name])   GERD (gastroesophageal reflux disease)   S/P CABG (coronary artery bypass graft)   Chronic kidney disease, stage III (moderate)   Paroxysmal A-fib ([name])   Toe pain, right-second   Osteomyelitis ([name])   Abscess or cellulitis of toe, right  Osteomyelitis of right second toe: MRI confirms osteomyelitis throughout distal phalanx of second toe with some fluid collection surrounding distal phalanx, consistent with an abscess.  Blood cultures have remained negative. POD#3 s/P right second toe ray amputation.  Antibiotics stopped after 24 hours of amputation.  PT OT saw patient.  They recommend SNF.  Case manager working on that however this has been challenging due to his history of cocaine abuse.  Chest pain with history of CAD/three-vessel disease: Cardiac enzymes negative.  His chest pain is chronic since he had CABG.  He has point tenderness.  EKG with no changes.  No further work-up indicated.   HIV: CD4 count 270 on 09/16/2018.  Continue home medications.  Follow patient.  Hyperlipidemia: Not on any medications.  Cocaine use disorder: Counseling regarding cessation provided.  Essential hypertension: Controlled.  Continue oral Cardizem.  Continue as needed hydralazine.  Type 2 diabetes mellitus: Blood sugar slightly elevated.  Increase Lantus to 20 units and continue SSI.  Paroxysmal A. Fib: Patient's cha2ds2 vascular 2 score is 4 but he is not on any anticoagulation.  Reason unknown. ?  Noncompliance.  I tried to initiate conversation with him initially on 07/22/2019 and then again on 07/23/2019 I had long conversation with him.  He did know that he had atrial fibrillation but did not know what that exactly is.  He did tell me that he was on " blood thinners" at 1 point in time but could not tell me the name and also when he was on that and that why he is not on any at this point in time.  He could not understand what the risk and  benefits of a blood thinner is and what her thromboembolism is.  This raised doubts about his capacity to understand medical problems.  I then asked him more questions to assess that, I asked him where he lives, he told me he is from [place].  I then asked him how he got to the [place], his answer was " visiting my sister in [place]".  He then changed his statement said he lives in [place].  I asked him if he lives in apartment, he said no he gave up his apartment when he went to [place].  He was able to tell me the address of his sister but he could not tell me address of his own in [place].  All of these suggest that he does not have capacity to understand medical issues that he has which makes it challenging to start him on any sort of anticoagulation without him understanding the risks and benefits and a strong history of noncompliance.  At this point in time, I do not think it will be safe for him to be on any anticoagulation now that he has been without any for a long time while he has been having active atrial fibrillation.  Anemia of chronic disease: His baseline seems to be between 9 and 10.  Currently at baseline.  Watch daily.  CKD stage III: Back to his baseline of around 1.5.  Diarrhea: Resolved.  DVT prophylaxis: Heparin subcu Code Status: Full code Family Communication:  None present at bedside.  Plan of care discussed with patient in length and he verbalized understanding and agreed with it. Disposition Plan: PT OT recommended SNF.  Case manager working on that.  Estimated body mass index is 24.77 kg/m as calculated from the following:   Height as of this encounter: 5\' 8"  (1.727 m).   Weight as of this encounter: 73.9 kg.     Nutritional status:  Nutrition Problem: Increased nutrient needs Etiology: wound healing, chronic illness(osteomyelitis of right toe; HIV)   Signs/Symptoms: estimated needs   Interventions: Ensure Enlive (each supplement provides  350kcal and 20 grams of protein), MVI, Juven  Consultants:   Orthopedics  Procedures:   None  Antimicrobials:   IV Rocephin and Flagyl.  Completed on 07/22/2019   Subjective: Seen and examined.  He states that he feels much better today.  Did not have any complaint.  Objective: Vitals:   07/25/19 0955 07/25/19 1448 07/25/19 2054 07/26/19 0502  BP: (!) 149/94 121/65 118/77 115/73  Pulse:  80 87 87  Resp:  13    Temp:  98.8 F (37.1 C) 98.8 F (37.1 C) 98.7 F (37.1 C)  TempSrc:  Oral Oral Oral  SpO2:  98% 98% 100%  Weight:    73.9 kg  Height:        Intake/Output Summary (Last 24 hours) at 07/26/2019 0914 Last data filed at 07/26/2019 0503 Gross per 24 hour  Intake 300 ml  Output 1475 ml  Net -1175 ml   Filed Weights   07/24/19 0424 07/25/19 0530 07/26/19 0502  Weight: 78.2 kg 75.2 kg 73.9 kg  Examination:  General exam: Appears calm and comfortable  Respiratory system: Clear to auscultation. Respiratory effort normal.  Point tenderness in the sternum. Cardiovascular system: S1 & S2 heard, RRR. No JVD, murmurs, rubs, gallops or clicks. No pedal edema. Gastrointestinal system: Abdomen is nondistended, soft and nontender. No organomegaly or masses felt. Normal bowel sounds heard. Central nervous system: Alert and oriented. No focal neurological deficits. Extremities: Symmetric 5 x 5 power. Skin: No rashes, lesions or ulcers.  Dressing in the right foot. Psychiatry: Judgement and insight appear poor. Mood & affect appropriate.   Data Reviewed: I have personally reviewed following labs and imaging studies  CBC: Recent Labs  Lab 07/22/19 0439 07/23/19 0248 07/24/19 0411 07/25/19 0452 07/26/19 0639  WBC 6.6 6.5 5.2 10.3 6.1  NEUTROABS 5.6 4.5 3.2 8.2* 4.4  HGB 8.9* 9.2* 9.4* 11.0* 8.9*  HCT 26.9* 28.9* 29.5* 34.3* 28.2*  MCV 79.1* 82.3 80.6 80.1 80.3  PLT 209 188 227 225 412   Basic Metabolic Panel: Recent Labs  Lab 07/22/19 0439 07/23/19 0248  07/24/19 1134 07/25/19 0452 07/26/19 0639  NA 131* 133* 136 134* 136  K 4.8 4.9 4.3 4.7 4.2  CL 98 104 100 98 101  CO2 21* 20* 27 24 26   GLUCOSE 411* 271* 135* 208* 272*  BUN 36* 40* 24* 27* 32*  CREATININE 1.98* 1.52* 1.37* 1.38* 1.55*  CALCIUM 8.7* 8.7* 9.4 9.5 8.9   GFR: Estimated Creatinine Clearance: 43.5 mL/min (A) (by C-G formula based on SCr of 1.55 mg/dL (H)). Liver Function Tests: Recent Labs  Lab 07/21/19 0316 07/22/19 0439 07/23/19 0248  AST 40 67* 71*  ALT 23 40 48*  ALKPHOS 50 47 51  BILITOT 0.5 0.7 0.5  PROT 6.1* 6.4* 6.5  ALBUMIN 2.5* 2.8* 3.0*   No results for input(s): LIPASE, AMYLASE in the last 168 hours. No results for input(s): AMMONIA in the last 168 hours. Coagulation Profile: No results for input(s): INR, PROTIME in the last 168 hours. Cardiac Enzymes: No results for input(s): CKTOTAL, CKMB, CKMBINDEX, TROPONINI in the last 168 hours. BNP (last 3 results) No results for input(s): PROBNP in the last 8760 hours. HbA1C: No results for input(s): HGBA1C in the last 72 hours. CBG: Recent Labs  Lab 07/25/19 0811 07/25/19 1203 07/25/19 1617 07/25/19 2050 07/26/19 0746  GLUCAP 203* 303* 166* 294* 260*   Lipid Profile: No results for input(s): CHOL, HDL, LDLCALC, TRIG, CHOLHDL, LDLDIRECT in the last 72 hours. Thyroid Function Tests: No results for input(s): TSH, T4TOTAL, FREET4, T3FREE, THYROIDAB in the last 72 hours. Anemia Panel: No results for input(s): VITAMINB12, FOLATE, FERRITIN, TIBC, IRON, RETICCTPCT in the last 72 hours. Sepsis Labs: No results for input(s): PROCALCITON, LATICACIDVEN in the last 168 hours.  Recent Results (from the past 240 hour(s))  SARS CORONAVIRUS 2 (TAT 6-24 HRS) Nasopharyngeal Nasopharyngeal Swab     Status: None   Collection Time: 07/16/19  4:35 PM   Specimen: Nasopharyngeal Swab  Result Value Ref Range Status   SARS Coronavirus 2 NEGATIVE NEGATIVE Final    Comment: (NOTE) SARS-CoV-2 target nucleic acids  are NOT DETECTED. The SARS-CoV-2 RNA is generally detectable in upper and lower respiratory specimens during the acute phase of infection. Negative results do not preclude SARS-CoV-2 infection, do not rule out co-infections with other pathogens, and should not be used as the sole basis for treatment or other patient management decisions. Negative results must be combined with clinical observations, patient history, and epidemiological information. The expected result is Negative. Fact  Sheet for Patients: SugarRoll.be Fact Sheet for Healthcare Providers: https://www.woods-mathews.com/ This test is not yet approved or cleared by the [place] FDA and  has been authorized for detection and/or diagnosis of SARS-CoV-2 by FDA under an Emergency Use Authorization (EUA). This EUA will remain  in effect (meaning this test can be used) for the duration of the COVID-19 declaration under Section 56 4(b)(1) of the Act, 21 U.S.C. section 360bbb-3(b)(1), unless the authorization is terminated or revoked sooner. Performed at [facility], [place] [address] [place], [place] [address]   Culture, blood (Routine X 2) w Reflex to ID Panel     Status: None   Collection Time: 07/17/19 12:43 AM   Specimen: BLOOD  Result Value Ref Range Status   Specimen Description BLOOD RIGHT ARM  Final   Special Requests   Final    BOTTLES DRAWN AEROBIC AND ANAEROBIC Blood Culture adequate volume   Culture   Final    NO GROWTH 5 DAYS Performed at [facility], [place] [address] [place], [place] [address]    Report Status 07/22/2019 FINAL  Final  Culture, blood (Routine X 2) w Reflex to ID Panel     Status: None   Collection Time: 07/17/19  1:00 AM   Specimen: BLOOD  Result Value Ref Range Status   Specimen Description BLOOD LEFT HAND  Final   Special Requests   Final    BOTTLES DRAWN AEROBIC AND ANAEROBIC Blood Culture adequate volume   Culture   Final    NO  GROWTH 5 DAYS Performed at [facility], [place] [address] [place], [place] [address]    Report Status 07/22/2019 FINAL  Final  Surgical PCR screen     Status: None   Collection Time: 07/19/19  4:30 PM   Specimen: Nasal Mucosa; Nasal Swab  Result Value Ref Range Status   MRSA, PCR NEGATIVE NEGATIVE Final   Staphylococcus aureus NEGATIVE NEGATIVE Final    Comment: (NOTE) The Xpert SA Assay (FDA approved for NASAL specimens in patients 27 years of age and older), is one component of a comprehensive surveillance program. It is not intended to diagnose infection nor to guide or monitor treatment. Performed at [facility], [place] [address] [place], [place] [address]       Radiology Studies: No results found.  Scheduled Meds: . allopurinol  300 mg Oral Daily  . aspirin  81 mg Oral Daily  . colchicine  0.6 mg Oral Daily  . diltiazem  120 mg Oral Daily  . docusate sodium  100 mg Oral BID  . emtricitabine-rilpivir-tenofovir AF  1 tablet Oral Q breakfast  . famotidine  20 mg Oral Daily  . feeding supplement (ENSURE ENLIVE)  237 mL Oral BID BM  . feeding supplement  296 mL Oral Once  . ferrous sulfate  325 mg Oral BID WC  . folic acid  1 mg Oral Daily  . gabapentin  400 mg Oral BID  . heparin injection (subcutaneous)  5,000 Units Subcutaneous Q8H  . insulin aspart  0-9 Units Subcutaneous TID WC  . insulin glargine  20 Units Subcutaneous Daily  . multivitamin  1 tablet Oral Daily  . nutrition supplement (JUVEN)  1 packet Oral BID BM  . risperiDONE  0.25 mg Oral BID  . sertraline  50 mg Oral Daily  . sodium chloride flush  3 mL Intravenous Once  . thiamine  100 mg Oral Daily   Continuous Infusions: . sodium  chloride    . lactated ringers 10 mL/hr at 07/21/19 1021  . methocarbamol (ROBAXIN) IV       LOS: 9 days   Time spent: 26 minutes   Darliss Cheney, MD [facility]  07/26/2019, 9:14 AM   To contact the attending provider between 7A-7P or the covering  provider during after hours 7P-7A, please log into the web site [email or web address] and use password [email or web address].

## 2019-07-26 NOTE — Plan of Care (Signed)
  Problem: Education: Goal: Knowledge of General Education information will improve Description: Including pain rating scale, medication(s)/side effects and non-pharmacologic comfort measures Outcome: Progressing   Problem: Clinical Measurements: Goal: Ability to maintain clinical measurements within normal limits will improve Outcome: Progressing Goal: Respiratory complications will improve Outcome: Progressing Goal: Cardiovascular complication will be avoided Outcome: Progressing   Problem: Activity: Goal: Risk for activity intolerance will decrease Outcome: Progressing   Problem: Nutrition: Goal: Adequate nutrition will be maintained Outcome: Progressing   Problem: Pain Managment: Goal: General experience of comfort will improve Outcome: Progressing

## 2019-07-26 NOTE — Progress Notes (Signed)
Inpatient Diabetes Program Recommendations  AACE/ADA: New Consensus Statement on Inpatient Glycemic Control (2015)  Target Ranges:  Prepandial:   less than 140 mg/dL      Peak postprandial:   less than 180 mg/dL (1-2 hours)      Critically ill patients:  140 - 180 mg/dL   Lab Results  Component Value Date   GLUCAP 260 (H) 07/26/2019   HGBA1C 9.7 (H) 07/17/2019    Review of Glycemic Control Results for Michael [name], Michael [name] (MRN [id number]) as of 07/26/2019 09:48  Ref. Range 07/25/2019 08:11 07/25/2019 12:03 07/25/2019 16:17 07/25/2019 20:50 07/26/2019 07:46  Glucose-Capillary Latest Ref Range: 70 - 99 mg/dL 203 (H) 303 (H) 166 (H) 294 (H) 260 (H)   Diabetes history: DM 2 Outpatient Diabetes medications:  Current orders for Inpatient glycemic control:  Lantus 20 units Novolog 0-9 units tid  Ensure Enlive bid  Inpatient Diabetes Program Recommendations:    Noted Lantus increased to 20 units.  Glucose increases after meal intake. PO intake at times variable. When eating consider Novolog 4 units tid meal coverage with parameters if pt consumes at least 50% of meals.  Thanks,  Tama Headings RN, MSN, BC-ADM Inpatient Diabetes Coordinator Team Pager [phone] (8a-5p)

## 2019-07-26 NOTE — Progress Notes (Signed)
Physical Therapy Treatment Patient Details Name: Michael [name] MRN: [id number] DOB: 1951/10/04 Today's Date: 07/26/2019    History of Present Illness Pt adm with chest pain. Found to have osteomyelitis and underwent rt 2nd toe amputation on 10/14. PMH - covid, htn, HIV, cabg, dm, ckd, PE, cocaine abuse    PT Comments    Pt is up to side of bed with min assist, requires min to mod to power up on RW with bed elevated and has similar need to transfer to the chair.  Keeping limited WB on RLE was a challenge, but could follow directions for limits of orders.  Note that his order limited him to TDWB and MD note does not specifically upgrade him to a specific amount of wgt on R foot.  Permits pressure on heel but maintained no more than TDWB due to order as written.  Continue on with current wb limits unless order is upgraded.   Follow Up Recommendations  SNF     Equipment Recommendations  Rolling walker with 5" wheels    Recommendations for Other Services       Precautions / Restrictions Precautions Precautions: Fall Restrictions Weight Bearing Restrictions: Yes RLE Weight Bearing: (WB orders are TDWB with minimzing pressure on MD note,) RLE Partial Weight Bearing Percentage or Pounds: minimize heel pressure    Mobility  Bed Mobility Overal bed mobility: Needs Assistance Bed Mobility: Supine to Sit     Supine to sit: Min guard     General bed mobility comments: used bed rail and assisted lines  Transfers Overall transfer level: Needs assistance Equipment used: Rolling walker (2 wheeled);Ambulation equipment used Transfers: Sit to/from Stand Sit to Stand: Mod assist;Min assist         General transfer comment: has significant difficulty with keeping wgt off RLE, min assist to maintain in standing and dense cues for light touch on R heel  Ambulation/Gait             General Gait Details: steps related to transfers   Stairs             Wheelchair  Mobility    Modified Rankin (Stroke Patients Only)       Balance     Sitting balance-Leahy Scale: Good     Standing balance support: Bilateral upper extremity supported Standing balance-Leahy Scale: Poor Standing balance comment: walker and min assist for static standing                            Cognition Arousal/Alertness: Awake/alert Behavior During Therapy: WFL for tasks assessed/performed Overall Cognitive Status: Within Functional Limits for tasks assessed                                        Exercises General Exercises - Lower Extremity Ankle Circles/Pumps: AROM;5 reps Quad Sets: AROM;10 reps    General Comments General comments (skin integrity, edema, etc.): pt is up to move with asistance to chair, with pain noted and relieved with elevation      Pertinent Vitals/Pain Pain Assessment: Faces Faces Pain Scale: Hurts even more Pain Location: rt foot Pain Descriptors / Indicators: Operative site guarding Pain Intervention(s): Limited activity within patient's tolerance;Monitored during session;Premedicated before session;Repositioned    Home Living  Prior Function            PT Goals (current goals can now be found in the care plan section) Acute Rehab PT Goals Patient Stated Goal: get home and feel better Progress towards PT goals: Progressing toward goals    Frequency    Min 2X/week      PT Plan Current plan remains appropriate    Co-evaluation              AM-PAC PT "6 Clicks" Mobility   Outcome Measure  Help needed turning from your back to your side while in a flat bed without using bedrails?: A Little Help needed moving from lying on your back to sitting on the side of a flat bed without using bedrails?: A Little Help needed moving to and from a bed to a chair (including a wheelchair)?: A Little Help needed standing up from a chair using your arms (e.g., wheelchair or  bedside chair)?: A Lot Help needed to walk in hospital room?: Total Help needed climbing 3-5 steps with a railing? : Total 6 Click Score: 13    End of Session Equipment Utilized During Treatment: Gait belt Activity Tolerance: Patient limited by fatigue;Patient limited by pain Patient left: in chair;with call bell/phone within reach;with chair alarm set;with nursing/sitter in room Nurse Communication: Mobility status PT Visit Diagnosis: Other abnormalities of gait and mobility (R26.89);Muscle weakness (generalized) (M62.81);Pain Pain - Right/Left: Right Pain - part of body: Ankle and joints of foot     Time: 1201-1224 PT Time Calculation (min) (ACUTE ONLY): 23 min  Charges:  $Therapeutic Exercise: 8-22 mins $Therapeutic Activity: 8-22 mins                    [name] 07/26/2019, 2:41 PM    [name], PT MS Acute Rehab Dept. Number: [place]

## 2019-07-27 LAB — CBC WITH DIFFERENTIAL/PLATELET
Abs Immature Granulocytes: 0.04 10*3/uL (ref 0.00–0.07)
Basophils Absolute: 0 10*3/uL (ref 0.0–0.1)
Basophils Relative: 1 %
Eosinophils Absolute: 0.1 10*3/uL (ref 0.0–0.5)
Eosinophils Relative: 3 %
HCT: 30.8 % — ABNORMAL LOW (ref 39.0–52.0)
Hemoglobin: 9.5 g/dL — ABNORMAL LOW (ref 13.0–17.0)
Immature Granulocytes: 1 %
Lymphocytes Relative: 29 %
Lymphs Abs: 1.4 10*3/uL (ref 0.7–4.0)
MCH: 25.3 pg — ABNORMAL LOW (ref 26.0–34.0)
MCHC: 30.8 g/dL (ref 30.0–36.0)
MCV: 82.1 fL (ref 80.0–100.0)
Monocytes Absolute: 0.6 10*3/uL (ref 0.1–1.0)
Monocytes Relative: 12 %
Neutro Abs: 2.8 10*3/uL (ref 1.7–7.7)
Neutrophils Relative %: 54 %
Platelets: 194 10*3/uL (ref 150–400)
RBC: 3.75 MIL/uL — ABNORMAL LOW (ref 4.22–5.81)
RDW: 14.6 % (ref 11.5–15.5)
WBC: 5 10*3/uL (ref 4.0–10.5)
nRBC: 0 % (ref 0.0–0.2)

## 2019-07-27 LAB — GLUCOSE, CAPILLARY
Glucose-Capillary: 174 mg/dL — ABNORMAL HIGH (ref 70–99)
Glucose-Capillary: 242 mg/dL — ABNORMAL HIGH (ref 70–99)
Glucose-Capillary: 240 mg/dL — ABNORMAL HIGH (ref 70–99)
Glucose-Capillary: 142 mg/dL — ABNORMAL HIGH (ref 70–99)

## 2019-07-27 MED ORDER — INSULIN GLARGINE 100 UNIT/ML ~~LOC~~ SOLN
5.0000 [IU] | Freq: Once | SUBCUTANEOUS | Status: AC
  Administered 2019-07-27: 5 [IU] via SUBCUTANEOUS
  Filled 2019-07-27: qty 0.05

## 2019-07-27 MED ORDER — INSULIN GLARGINE 100 UNIT/ML ~~LOC~~ SOLN
25.0000 [IU] | Freq: Every day | SUBCUTANEOUS | Status: DC
  Administered 2019-07-28 – 2019-08-04 (×8): 25 [IU] via SUBCUTANEOUS
  Filled 2019-07-27 (×8): qty 0.25

## 2019-07-27 NOTE — Plan of Care (Signed)
  Problem: Education: Goal: Knowledge of General Education information will improve Description: Including pain rating scale, medication(s)/side effects and non-pharmacologic comfort measures Outcome: Progressing   Problem: Clinical Measurements: Goal: Ability to maintain clinical measurements within normal limits will improve Outcome: Progressing Goal: Will remain free from infection Outcome: Progressing Goal: Diagnostic test results will improve Outcome: Progressing Goal: Respiratory complications will improve Outcome: Progressing Goal: Cardiovascular complication will be avoided Outcome: Progressing   Problem: Activity: Goal: Risk for activity intolerance will decrease Outcome: Progressing   Problem: Nutrition: Goal: Adequate nutrition will be maintained Outcome: Progressing   Problem: Coping: Goal: Level of anxiety will decrease Outcome: Progressing   Problem: Elimination: Goal: Will not experience complications related to bowel motility Outcome: Progressing Goal: Will not experience complications related to urinary retention Outcome: Progressing   

## 2019-07-27 NOTE — TOC Progression Note (Signed)
Transition of Care [facility]) - Progression Note    Patient Details  Name: Michael [name] MRN: [id number] Date of Birth: Jun 30, 1951  Transition of Care [facility]) CM/SW Contact  Graves-Bigelow, Ocie Cornfield, RN Phone Number: 07/27/2019, 10:16 AM  Clinical Narrative: Post right second toe amputation- Not a candidate for SNF hx cocaine abuse- Faxed out to all of [employer]. However, did receive authorization from [place] ended 07-26-19. CM discussed with Lead- Clinical Supervisor-PT was asked to work with patient-Awaiting to see if patient can be re-conditioned for recommendations for [place]. Lived in a [place] prior to admission. Patient is aware that SNF's have declined the patient due to drug history. CM will continue to follow the patient for transition of care needs.   Expected Discharge Plan: [place] Barriers to Discharge: Continued Medical Work up(Plan for toe amputation on Wednesday 07-21-19)  Expected Discharge Plan and Services Expected Discharge Plan: [place]   Discharge Planning Services: CM Consult   Living arrangements for the past 2 months: Hotel/Motel                 Readmission Risk Interventions Readmission Risk Prevention Plan 07/19/2019  Transportation Screening Complete  Medication Review (RN Care Manager) Complete  HRI or Home Care Consult Complete  Palliative Care Screening Not Applicable  Some recent data might be hidden

## 2019-07-27 NOTE — Progress Notes (Signed)
PROGRESS NOTE    Michael [name]  GGY:[id number] DOB: 02/11/1951 DOA: 07/16/2019 PCP: [name], PA-C   Brief Narrative:  Michael [name] is a 68 y.o. male with medical history significant of hypertension, hyperlipidemia, diabetes mellitus, asthma, GERD, gout, depression, cocaine abuse, PE not on anticoagulants, HIV, CAD, s/po of CABG, s/p of stent placement, atrial fibrillation not on anticoagulants, CKD stage III, iron deficiency anemia, who presented with chest pain.  Per patient, he has been having chest pain intermittently for more than a year since he had his CABG surgery but his pain has worsened in the last 2 days.  This was located at substernal area.  Aggravated with cough and deep breaths.  Patient recently had long distant travel to [place] a month ago.  No other symptoms.  He used cocaine day prior to admission.  Also had swelling and pain in right second toe.  Upon arrival to ED, pt was found to have troponin 102, WBC 6.4, negative COVID-19 test, renal function close to baseline, temperature normal, blood pressure 145/86, heart rate 72, oxygen saturation 99% on room air.  Chest x-ray is negative.  X-ray of right foot showed possible distal second toe osteomyelitis.  Patient was admitted under hospitalist service.  He was ruled out of MI with negative cardiac enzymes.  MRI of right foot confirmed right second toe osteomyelitis and possible abscess.  Patient was seen by Dr. [name] and underwent right second toe ray amputation on 07/21/2019.  Patient was evaluated by PT OT and they recommended SNF.  Case manager on board.  Assessment & Plan:   Principal Problem:   Chest pain Active Problems:   HIV (human immunodeficiency virus infection) ([place])   Hyperlipidemia with target LDL less than 100   3-vessel CAD   Severe recurrent major depressive disorder with psychotic features ([name])   Cocaine use disorder, severe, dependence ([place])   Gout   Essential hypertension    Type II diabetes mellitus with renal manifestations ([name])   GERD (gastroesophageal reflux disease)   S/P CABG (coronary artery bypass graft)   Chronic kidney disease, stage III (moderate)   Paroxysmal A-fib ([name])   Toe pain, right-second   Osteomyelitis ([name])   Abscess or cellulitis of toe, right  Osteomyelitis of right second toe: MRI confirms osteomyelitis throughout distal phalanx of second toe with some fluid collection surrounding distal phalanx, consistent with an abscess.  Blood cultures have remained negative. s/P right second toe ray amputation.  Antibiotics stopped after 24 hours of amputation.  PT OT saw patient.  They recommend SNF.  Case manager working on that however this has been challenging due to his history of cocaine abuse.  Chest pain with history of CAD/three-vessel disease: Cardiac enzymes negative.  His chest pain is chronic since he had CABG.  He has point tenderness.  EKG with no changes.  No further work-up indicated.   HIV: CD4 count 270 on 09/16/2018.  Continue home medications.  Follow patient.  Hyperlipidemia: Not on any medications.  Cocaine use disorder: Counseling regarding cessation provided.  Essential hypertension: Controlled.  Continue oral Cardizem.  Continue as needed hydralazine.  Type 2 diabetes mellitus: Blood sugar elevated again.  He received 20 units of Lantus already.  We will give him 5 more units and increase Lantus to 25 units starting tomorrow and continue SSI.  Paroxysmal A. Fib: Patient's cha2ds2 vascular 2 score is 4 but he is not on any anticoagulation.  Reason unknown. ?  Noncompliance.  I tried  to initiate conversation with him initially on 07/22/2019 and then again on 07/23/2019 I had long conversation with him.  He did know that he had atrial fibrillation but did not know what that exactly is.  He did tell me that he was on " blood thinners" at 1 point in time but could not tell me the name and also when he was on that and that why he  is not on any at this point in time.  He could not understand what the risk and benefits of a blood thinner is and what her thromboembolism is.  This raised doubts about his capacity to understand medical problems.  I then asked him more questions to assess that, I asked him where he lives, he told me he is from [place].  I then asked him how he got to the [place], his answer was " visiting my sister in [name]".  He then changed his statement said he lives in [place].  I asked him if he lives in apartment, he said no he gave up his apartment when he went to [place].  He was able to tell me the address of his sister but he could not tell me address of his own in [place].  All of these suggest that he does not have capacity to understand medical issues that he has which makes it challenging to start him on any sort of anticoagulation without him understanding the risks and benefits and a strong history of noncompliance.  At this point in time, I do not think it will be safe for him to be on any anticoagulation now that he has been without any for a long time while he has been having active atrial fibrillation.  Anemia of chronic disease: His baseline seems to be between 9 and 10.  Currently at baseline.  Watch daily.  CKD stage III: Back to his baseline of around 1.5.  Diarrhea: Resolved.  DVT prophylaxis: Heparin subcu Code Status: Full code Family Communication:  None present at bedside.  Plan of care discussed with patient in length and he verbalized understanding and agreed with it. Disposition Plan: PT OT recommended SNF.  Case manager working on that.  Estimated body mass index is 24.84 kg/m as calculated from the following:   Height as of this encounter: 5\' 8"  (1.727 m).   Weight as of this encounter: 74.1 kg.     Nutritional status:  Nutrition Problem: Increased nutrient needs Etiology: wound healing, chronic illness(osteomyelitis of right toe;  HIV)   Signs/Symptoms: estimated needs   Interventions: Ensure Enlive (each supplement provides 350kcal and 20 grams of protein), MVI, Juven  Consultants:   Orthopedics  Procedures:   None  Antimicrobials:   IV Rocephin and Flagyl.  Completed on 07/22/2019   Subjective: Patient seen and examined.  Continues to complain of right foot pain.  He thinks that now he has pain in the third toe.  Continues to have intermittent chest pain.  Objective: Vitals:   07/26/19 2017 07/26/19 2048 07/27/19 0537 07/27/19 0700  BP:  (!) 155/85 131/75   Pulse:  73 (!) 38   Resp: 11   15  Temp:  (!) 97.5 F (36.4 C) 98.2 F (36.8 C)   TempSrc:  Oral Oral   SpO2:  100% 100%   Weight:   74.1 kg   Height:        Intake/Output Summary (Last 24 hours) at 07/27/2019 0901 Last data filed at 07/27/2019 0558 Gross per 24 hour  Intake 360 ml  Output 800 ml  Net -440 ml   Filed Weights   07/25/19 0530 07/26/19 0502 07/27/19 0537  Weight: 75.2 kg 73.9 kg 74.1 kg    Examination:  General exam: Appears calm and comfortable  Respiratory system: Clear to auscultation. Respiratory effort normal.  Point tenderness at the sternum Cardiovascular system: S1 & S2 heard, RRR. No JVD, murmurs, rubs, gallops or clicks. No pedal edema. Gastrointestinal system: Abdomen is nondistended, soft and nontender. No organomegaly or masses felt. Normal bowel sounds heard. Central nervous system: Alert and oriented. No focal neurological deficits. Extremities: Symmetric 5 x 5 power. Skin: No rashes, lesions or ulcers.  Dressing in the right foot Psychiatry: Judgement and insight appear poor. Mood & affect appropriate.    Data Reviewed: I have personally reviewed following labs and imaging studies  CBC: Recent Labs  Lab 07/23/19 0248 07/24/19 0411 07/25/19 0452 07/26/19 0639 07/27/19 0350  WBC 6.5 5.2 10.3 6.1 5.0  NEUTROABS 4.5 3.2 8.2* 4.4 2.8  HGB 9.2* 9.4* 11.0* 8.9* 9.5*  HCT 28.9* 29.5* 34.3*  28.2* 30.8*  MCV 82.3 80.6 80.1 80.3 82.1  PLT 188 227 225 243 951   Basic Metabolic Panel: Recent Labs  Lab 07/22/19 0439 07/23/19 0248 07/24/19 1134 07/25/19 0452 07/26/19 0639  NA 131* 133* 136 134* 136  K 4.8 4.9 4.3 4.7 4.2  CL 98 104 100 98 101  CO2 21* 20* 27 24 26   GLUCOSE 411* 271* 135* 208* 272*  BUN 36* 40* 24* 27* 32*  CREATININE 1.98* 1.52* 1.37* 1.38* 1.55*  CALCIUM 8.7* 8.7* 9.4 9.5 8.9   GFR: Estimated Creatinine Clearance: 43.5 mL/min (A) (by C-G formula based on SCr of 1.55 mg/dL (H)). Liver Function Tests: Recent Labs  Lab 07/21/19 0316 07/22/19 0439 07/23/19 0248  AST 40 67* 71*  ALT 23 40 48*  ALKPHOS 50 47 51  BILITOT 0.5 0.7 0.5  PROT 6.1* 6.4* 6.5  ALBUMIN 2.5* 2.8* 3.0*   No results for input(s): LIPASE, AMYLASE in the last 168 hours. No results for input(s): AMMONIA in the last 168 hours. Coagulation Profile: No results for input(s): INR, PROTIME in the last 168 hours. Cardiac Enzymes: No results for input(s): CKTOTAL, CKMB, CKMBINDEX, TROPONINI in the last 168 hours. BNP (last 3 results) No results for input(s): PROBNP in the last 8760 hours. HbA1C: No results for input(s): HGBA1C in the last 72 hours. CBG: Recent Labs  Lab 07/26/19 0746 07/26/19 1119 07/26/19 1638 07/26/19 2122 07/27/19 0810  GLUCAP 260* 265* 326* 238* 174*   Lipid Profile: No results for input(s): CHOL, HDL, LDLCALC, TRIG, CHOLHDL, LDLDIRECT in the last 72 hours. Thyroid Function Tests: No results for input(s): TSH, T4TOTAL, FREET4, T3FREE, THYROIDAB in the last 72 hours. Anemia Panel: No results for input(s): VITAMINB12, FOLATE, FERRITIN, TIBC, IRON, RETICCTPCT in the last 72 hours. Sepsis Labs: No results for input(s): PROCALCITON, LATICACIDVEN in the last 168 hours.  Recent Results (from the past 240 hour(s))  Surgical PCR screen     Status: None   Collection Time: 07/19/19  4:30 PM   Specimen: Nasal Mucosa; Nasal Swab  Result Value Ref Range Status    MRSA, PCR NEGATIVE NEGATIVE Final   Staphylococcus aureus NEGATIVE NEGATIVE Final    Comment: (NOTE) The Xpert SA Assay (FDA approved for NASAL specimens in patients 40 years of age and older), is one component of a comprehensive surveillance program. It is not intended to diagnose infection nor to guide or monitor treatment.  Performed at [facility], [place] [address] [place], [place] [address]       Radiology Studies: No results found.  Scheduled Meds: . allopurinol  300 mg Oral Daily  . aspirin  81 mg Oral Daily  . colchicine  0.6 mg Oral Daily  . diltiazem  120 mg Oral Daily  . docusate sodium  100 mg Oral BID  . emtricitabine-rilpivir-tenofovir AF  1 tablet Oral Q breakfast  . famotidine  20 mg Oral Daily  . feeding supplement (ENSURE ENLIVE)  237 mL Oral BID BM  . feeding supplement  296 mL Oral Once  . ferrous sulfate  325 mg Oral BID WC  . folic acid  1 mg Oral Daily  . gabapentin  400 mg Oral BID  . heparin injection (subcutaneous)  5,000 Units Subcutaneous Q8H  . insulin aspart  0-9 Units Subcutaneous TID WC  . insulin glargine  20 Units Subcutaneous Daily  . multivitamin  1 tablet Oral Daily  . nutrition supplement (JUVEN)  1 packet Oral BID BM  . risperiDONE  0.25 mg Oral BID  . sertraline  50 mg Oral Daily  . sodium chloride flush  3 mL Intravenous Once  . thiamine  100 mg Oral Daily   Continuous Infusions: . sodium chloride    . lactated ringers 10 mL/hr at 07/21/19 1021  . methocarbamol (ROBAXIN) IV       LOS: 10 days   Time spent: 25 minutes   Darliss Cheney, MD [facility]  07/27/2019, 9:01 AM   To contact the attending provider between 7A-7P or the covering provider during after hours 7P-7A, please log into the web site [email or web address] and use password [email or web address].

## 2019-07-27 NOTE — Progress Notes (Signed)
Inpatient Diabetes Program Recommendations  AACE/ADA: New Consensus Statement on Inpatient Glycemic Control (2015)  Target Ranges:  Prepandial:   less than 140 mg/dL      Peak postprandial:   less than 180 mg/dL (1-2 hours)      Critically ill patients:  140 - 180 mg/dL   Lab Results  Component Value Date   GLUCAP 174 (H) 07/27/2019   HGBA1C 9.7 (H) 07/17/2019    Review of Glycemic Control Results for KROSBY, RITCHIE (MRN [id number]) as of 07/26/2019 09:48  Ref. Range 07/25/2019 08:11 07/25/2019 12:03 07/25/2019 16:17 07/25/2019 20:50  Glucose-Capillary Latest Ref Range: 70 - 99 mg/dL 203 (H) 303 (H) 166 (H) 294 (H)  Results for EMILLIANO, DILWORTH (MRN [id number]) as of 07/27/2019 08:54  Ref. Range 07/26/2019 07:46 07/26/2019 11:19 07/26/2019 16:38 07/26/2019 21:22 07/27/2019 08:10  Glucose-Capillary Latest Ref Range: 70 - 99 mg/dL 260 (H) 265 (H) 326 (H) 238 (H) 174 (H)   Diabetes history: DM 2 Outpatient Diabetes medications:  Current orders for Inpatient glycemic control:  Lantus 20 units Novolog 0-9 units tid  Ensure Enlive bid  Inpatient Diabetes Program Recommendations:    Noted Lantus increased to 20 units.  Glucose increases after meal intake. PO intake at times variable. When eating consider Novolog 4 units tid meal coverage with parameters if pt consumes at least 50% of meals.  Thanks,  Tama Headings RN, MSN, BC-ADM Inpatient Diabetes Coordinator Team Pager [phone] (8a-5p)

## 2019-07-27 NOTE — Progress Notes (Signed)
Orthopedic Tech Progress Note Patient Details:  Michael [name] 03/13/1951 [id number] RN called requesting a shoe for patient. Stated patient has been working with therapy and wondered why patient had not received a shoe yet. RN said patient had amputated toe(s) so I put patient on a DARCO shoe and let him know to walk on HEEL when wearing that shoe.  Ortho Devices Type of Ortho Device: Darco shoe Ortho Device/Splint Location: LRE Ortho Device/Splint Interventions: Adjustment, Application, Ordered   Post Interventions Patient Tolerated: Well Instructions Provided: Care of device, Adjustment of device   Janit Pagan 07/27/2019, 1:20 PM

## 2019-07-28 LAB — CBC WITH DIFFERENTIAL/PLATELET
Abs Immature Granulocytes: 0.04 10*3/uL (ref 0.00–0.07)
Basophils Absolute: 0 10*3/uL (ref 0.0–0.1)
Basophils Relative: 1 %
Eosinophils Absolute: 0.1 10*3/uL (ref 0.0–0.5)
Eosinophils Relative: 3 %
HCT: 28.1 % — ABNORMAL LOW (ref 39.0–52.0)
Hemoglobin: 9.1 g/dL — ABNORMAL LOW (ref 13.0–17.0)
Immature Granulocytes: 1 %
Lymphocytes Relative: 30 %
Lymphs Abs: 1.2 10*3/uL (ref 0.7–4.0)
MCH: 25.9 pg — ABNORMAL LOW (ref 26.0–34.0)
MCHC: 32.4 g/dL (ref 30.0–36.0)
MCV: 80.1 fL (ref 80.0–100.0)
Monocytes Absolute: 0.4 10*3/uL (ref 0.1–1.0)
Monocytes Relative: 11 %
Neutro Abs: 2.2 10*3/uL (ref 1.7–7.7)
Neutrophils Relative %: 54 %
Platelets: 225 10*3/uL (ref 150–400)
RBC: 3.51 MIL/uL — ABNORMAL LOW (ref 4.22–5.81)
RDW: 14.3 % (ref 11.5–15.5)
WBC: 4.1 10*3/uL (ref 4.0–10.5)
nRBC: 0 % (ref 0.0–0.2)

## 2019-07-28 LAB — GLUCOSE, CAPILLARY
Glucose-Capillary: 149 mg/dL — ABNORMAL HIGH (ref 70–99)
Glucose-Capillary: 244 mg/dL — ABNORMAL HIGH (ref 70–99)
Glucose-Capillary: 292 mg/dL — ABNORMAL HIGH (ref 70–99)
Glucose-Capillary: 215 mg/dL — ABNORMAL HIGH (ref 70–99)

## 2019-07-28 LAB — BASIC METABOLIC PANEL
Anion gap: 10 (ref 5–15)
BUN: 24 mg/dL — ABNORMAL HIGH (ref 8–23)
CO2: 25 mmol/L (ref 22–32)
Calcium: 9 mg/dL (ref 8.9–10.3)
Chloride: 102 mmol/L (ref 98–111)
Creatinine, Ser: 1.45 mg/dL — ABNORMAL HIGH (ref 0.61–1.24)
GFR calc Af Amer: 57 mL/min — ABNORMAL LOW (ref >60)
GFR calc non Af Amer: 49 mL/min — ABNORMAL LOW (ref >60)
Glucose, Bld: 182 mg/dL — ABNORMAL HIGH (ref 70–99)
Potassium: 4.2 mmol/L (ref 3.5–5.1)
Sodium: 137 mmol/L (ref 135–145)

## 2019-07-28 MED ORDER — ENSURE MAX PROTEIN PO LIQD
11.0000 [oz_av] | Freq: Every day | ORAL | Status: DC
  Administered 2019-07-28: 11 [oz_av] via ORAL
  Administered 2019-07-29 – 2019-07-30 (×2): 237 mL via ORAL
  Administered 2019-08-03: 11 [oz_av] via ORAL
  Filled 2019-07-28 (×10): qty 330

## 2019-07-28 MED ORDER — INSULIN ASPART 100 UNIT/ML ~~LOC~~ SOLN
0.0000 [IU] | Freq: Three times a day (TID) | SUBCUTANEOUS | Status: DC
  Administered 2019-07-29: 5 [IU] via SUBCUTANEOUS
  Administered 2019-07-29: 1 [IU] via SUBCUTANEOUS
  Administered 2019-07-29 – 2019-07-30 (×2): 2 [IU] via SUBCUTANEOUS
  Administered 2019-07-30: 1 [IU] via SUBCUTANEOUS
  Administered 2019-07-30: 3 [IU] via SUBCUTANEOUS
  Administered 2019-07-31: 1 [IU] via SUBCUTANEOUS
  Administered 2019-07-31 – 2019-08-01 (×3): 2 [IU] via SUBCUTANEOUS
  Administered 2019-08-01: 1 [IU] via SUBCUTANEOUS
  Administered 2019-08-02: 3 [IU] via SUBCUTANEOUS
  Administered 2019-08-02 – 2019-08-03 (×2): 1 [IU] via SUBCUTANEOUS
  Administered 2019-08-03 – 2019-08-04 (×3): 2 [IU] via SUBCUTANEOUS

## 2019-07-28 MED ORDER — INSULIN ASPART 100 UNIT/ML ~~LOC~~ SOLN
0.0000 [IU] | Freq: Every day | SUBCUTANEOUS | Status: DC
  Administered 2019-07-28 – 2019-07-30 (×2): 2 [IU] via SUBCUTANEOUS
  Administered 2019-08-01: 3 [IU] via SUBCUTANEOUS
  Administered 2019-08-02: 2 [IU] via SUBCUTANEOUS

## 2019-07-28 MED ORDER — INSULIN ASPART 100 UNIT/ML ~~LOC~~ SOLN
3.0000 [IU] | Freq: Three times a day (TID) | SUBCUTANEOUS | Status: DC
  Administered 2019-07-29 – 2019-08-04 (×19): 3 [IU] via SUBCUTANEOUS

## 2019-07-28 NOTE — Progress Notes (Signed)
PROGRESS NOTE    Michael [name]  TGG:[id number] DOB: Feb 23, 1951 DOA: 07/16/2019 PCP: [name], PA-C   Brief Narrative:  Michael Antis Andersonis a 68 y.o.malewith medical history significant ofhypertension, hyperlipidemia, diabetes mellitus, asthma, GERD, gout, depression, cocaine abuse, PE not on anticoagulants, HIV, CAD,s/po ofCABG,s/p ofstent placement, atrial fibrillation not on anticoagulants, CKD stage III, iron deficiency anemia, who presented with chest pain.  Per patient, he has been having chest pain intermittently for more than a year since he had his CABG surgery but his pain has worsened in the last 2 days.  This was located at substernal area.  Aggravated with cough and deep breaths.  Patient recently had long distant travel to [place] a month ago.  No other symptoms.  He used cocaine day prior to admission.  Also had swelling and pain in right second toe.  Upon arrival to ED, pt was found to have troponin 102, WBC 6.4, negative COVID-19 test, renal function close to baseline, temperature normal, blood pressure 145/86, heart rate 72, oxygen saturation 99% on room air. Chest x-ray is negative. X-ray of right foot showed possible distal second toe osteomyelitis.  Patient was admitted under hospitalist service.  He was ruled out of MI with negative cardiac enzymes.  MRI of right foot confirmed right second toe osteomyelitis and possible abscess.  Patient was seen by Dr. [name] and underwent right second toe ray amputation on 07/21/2019.  Patient was evaluated by PT OT and they recommended SNF.  Case manager on board.  Assessment & Plan:   Principal Problem:   Chest pain Active Problems:   HIV (human immunodeficiency virus infection) ([place])   Hyperlipidemia with target LDL less than 100   3-vessel CAD   Severe recurrent major depressive disorder with psychotic features ([name])   Cocaine use disorder, severe, dependence ([place])   Gout   Essential hypertension  Type II diabetes mellitus with renal manifestations ([name])   GERD (gastroesophageal reflux disease)   S/P CABG (coronary artery bypass graft)   Chronic kidney disease, stage III (moderate)   Paroxysmal A-fib ([name])   Toe pain, right-second   Osteomyelitis ([name])   Abscess or cellulitis of toe, right   Osteomyelitis of right second toe: MRI confirms osteomyelitis throughout distal phalanx of second toe with some fluid collection surrounding distal phalanx, consistent with an abscess.  Blood cultures have remained negative. S/p right second toe ray amputation on 10/14.  Antibiotics stopped after 24 hours of amputation.  PT OT saw patient.  They recommend SNF.  Case manager working on that however this has been challenging due to his history of cocaine abuse.  Will try to touch base with family if pt agreeable.  Chest pain with history of CAD/three-vessel disease: Cardiac enzymes negative.  His chest pain is chronic since he had CABG.  He has point tenderness.  EKG with no changes.  No further work-up indicated.   HIV: CD4 count 270 on 09/16/2018.  Continue home medications.  Follow patient.  Hyperlipidemia: Not on any medications.  Cocaine use disorder: Counseling regarding cessation provided.  Essential hypertension: Controlled.  Continue oral Cardizem.  Continue as needed hydralazine.  Type 2 diabetes mellitus: Blood sugar elevated again.  Continue 25 units lantus, add mealtime insulin.  Paroxysmal A. Fib: Patient's cha2ds2 vascular 2 score is 4 but he is not on any anticoagulation.  Reason unknown. ?  Noncompliance.  Question of lack of capacity on 10/20, but when I discussed this with him he appeared to have capacity  and was able to teach back to me anticoagulation.  He may have limited medical literacy, but at this time, I believe he has capacity.  As above, will attempt to discuss with family.  Will hold off on anticoagulation at this point and try to look into hx a bit more (EKG from  presentation with sinus arrhythmia).    Anemia of chronic disease: His baseline seems to be between 9 and 10.  Currently at baseline.  Watch daily.  CKD stage III: Back to his baseline of around 1.5.  Diarrhea: Resolved.  DVT prophylaxis: heparin  Code Status: full  Family Communication: none at bedside Disposition Plan: pending safe discharge plan   Consultants:   orthopedics  Procedures:   R second toe amputation on 10/14  Antimicrobials:  Anti-infectives (From admission, onward)   Start     Dose/Rate Route Frequency Ordered Stop   07/21/19 0600  ceFAZolin (ANCEF) IVPB 2g/100 mL premix     2 g 200 mL/hr over 30 Minutes Intravenous On call to O.R. 07/20/19 1928 07/21/19 1115   07/17/19 1100  cefTRIAXone (ROCEPHIN) 2 g in sodium chloride 0.9 % 100 mL IVPB     2 g 200 mL/hr over 30 Minutes Intravenous Every 24 hours 07/17/19 1017 07/22/19 1306   07/17/19 1100  metroNIDAZOLE (FLAGYL) IVPB 500 mg     500 mg 100 mL/hr over 60 Minutes Intravenous Every 8 hours 07/17/19 1017 07/22/19 2344   07/17/19 0800  emtricitabine-rilpivir-tenofovir AF (ODEFSEY) 200-25-25 MG per tablet 1 tablet    Note to Pharmacy: For HIV infection     1 tablet Oral Daily with breakfast 07/16/19 2258       Subjective: Describes some foot pain, otherwise, no complaints  Objective: Vitals:   07/27/19 2014 07/28/19 0635 07/28/19 0740 07/28/19 1419  BP: 133/79 136/84 112/86 129/67  Pulse: 73 72  82  Resp:   13 (!) 21  Temp: 97.8 F (36.6 C) (!) 97.5 F (36.4 C)  97.9 F (36.6 C)  TempSrc: Oral Oral  Oral  SpO2: 100% 100% 97%   Weight:  73.4 kg    Height:        Intake/Output Summary (Last 24 hours) at 07/28/2019 1902 Last data filed at 07/28/2019 1700 Gross per 24 hour  Intake 1230 ml  Output 1250 ml  Net -20 ml   Filed Weights   07/26/19 0502 07/27/19 0537 07/28/19 0635  Weight: 73.9 kg 74.1 kg 73.4 kg    Examination:  General exam: Appears calm and comfortable  Respiratory  system: Clear to auscultation. Respiratory effort normal. Cardiovascular system: S1 & S2 heard, RRR.  Gastrointestinal system: Abdomen is nondistended, soft and nontender.  Central nervous system: Alert and oriented. No focal neurological deficits. Extremities: LLE with intact dressing Skin: No rashes, lesions or ulcers Psychiatry: Judgement and insight appear normal. Mood & affect appropriate.     Data Reviewed: I have personally reviewed following labs and imaging studies  CBC: Recent Labs  Lab 07/24/19 0411 07/25/19 0452 07/26/19 0639 07/27/19 0350 07/28/19 0400  WBC 5.2 10.3 6.1 5.0 4.1  NEUTROABS 3.2 8.2* 4.4 2.8 2.2  HGB 9.4* 11.0* 8.9* 9.5* 9.1*  HCT 29.5* 34.3* 28.2* 30.8* 28.1*  MCV 80.6 80.1 80.3 82.1 80.1  PLT 227 225 243 194 161   Basic Metabolic Panel: Recent Labs  Lab 07/23/19 0248 07/24/19 1134 07/25/19 0452 07/26/19 0639 07/28/19 0937  NA 133* 136 134* 136 137  K 4.9 4.3 4.7 4.2 4.2  CL 104  100 98 101 102  CO2 20* 27 24 26 25   GLUCOSE 271* 135* 208* 272* 182*  BUN 40* 24* 27* 32* 24*  CREATININE 1.52* 1.37* 1.38* 1.55* 1.45*  CALCIUM 8.7* 9.4 9.5 8.9 9.0   GFR: Estimated Creatinine Clearance: 46.5 mL/min (A) (by C-G formula based on SCr of 1.45 mg/dL (H)). Liver Function Tests: Recent Labs  Lab 07/22/19 0439 07/23/19 0248  AST 67* 71*  ALT 40 48*  ALKPHOS 47 51  BILITOT 0.7 0.5  PROT 6.4* 6.5  ALBUMIN 2.8* 3.0*   No results for input(s): LIPASE, AMYLASE in the last 168 hours. No results for input(s): AMMONIA in the last 168 hours. Coagulation Profile: No results for input(s): INR, PROTIME in the last 168 hours. Cardiac Enzymes: No results for input(s): CKTOTAL, CKMB, CKMBINDEX, TROPONINI in the last 168 hours. BNP (last 3 results) No results for input(s): PROBNP in the last 8760 hours. HbA1C: No results for input(s): HGBA1C in the last 72 hours. CBG: Recent Labs  Lab 07/27/19 1734 07/27/19 2130 07/28/19 0853 07/28/19 1134  07/28/19 1558  GLUCAP 240* 142* 149* 244* 292*   Lipid Profile: No results for input(s): CHOL, HDL, LDLCALC, TRIG, CHOLHDL, LDLDIRECT in the last 72 hours. Thyroid Function Tests: No results for input(s): TSH, T4TOTAL, FREET4, T3FREE, THYROIDAB in the last 72 hours. Anemia Panel: No results for input(s): VITAMINB12, FOLATE, FERRITIN, TIBC, IRON, RETICCTPCT in the last 72 hours. Sepsis Labs: No results for input(s): PROCALCITON, LATICACIDVEN in the last 168 hours.  Recent Results (from the past 240 hour(s))  Surgical PCR screen     Status: None   Collection Time: 07/19/19  4:30 PM   Specimen: Nasal Mucosa; Nasal Swab  Result Value Ref Range Status   MRSA, PCR NEGATIVE NEGATIVE Final   Staphylococcus aureus NEGATIVE NEGATIVE Final    Comment: (NOTE) The Xpert SA Assay (FDA approved for NASAL specimens in patients 18 years of age and older), is one component of a comprehensive surveillance program. It is not intended to diagnose infection nor to guide or monitor treatment. Performed at [facility], [place] [address] [place], [place] [address]          Radiology Studies: No results found.      Scheduled Meds: . allopurinol  300 mg Oral Daily  . aspirin  81 mg Oral Daily  . colchicine  0.6 mg Oral Daily  . diltiazem  120 mg Oral Daily  . docusate sodium  100 mg Oral BID  . emtricitabine-rilpivir-tenofovir AF  1 tablet Oral Q breakfast  . famotidine  20 mg Oral Daily  . ferrous sulfate  325 mg Oral BID WC  . folic acid  1 mg Oral Daily  . gabapentin  400 mg Oral BID  . heparin injection (subcutaneous)  5,000 Units Subcutaneous Q8H  . insulin aspart  0-9 Units Subcutaneous TID WC  . insulin glargine  25 Units Subcutaneous Daily  . multivitamin  1 tablet Oral Daily  . nutrition supplement (JUVEN)  1 packet Oral BID BM  . Ensure Max Protein  11 oz Oral Daily  . risperiDONE  0.25 mg Oral BID  . sertraline  50 mg Oral Daily  . sodium chloride flush  3 mL  Intravenous Once  . thiamine  100 mg Oral Daily   Continuous Infusions: . sodium chloride    . lactated ringers 10 mL/hr at 07/21/19 1021  . methocarbamol (ROBAXIN) IV       LOS: 11 days  Time spent: over 30 min    Fayrene Helper, MD [facility] Pager [email or web address]  If 7PM-7AM, please contact night-coverage [email or web address] Password [email or web address] 07/28/2019, 7:02 PM

## 2019-07-28 NOTE — Progress Notes (Signed)
Nutrition Follow-up  INTERVENTION:   -D/c Ensure Enlive -Ensure MAX Protein po daily, each supplement provides 150 kcal and 30 grams of protein -Juven BID, each packet provides 95 calories, 2.5 grams of protein (collagen), and 9.8 grams of carbohydrate (3 grams sugar); also contains 7 grams of L-arginine and L-glutamine, 300 mg vitamin C, 15 mg vitamin E, 1.2 mcg vitamin B-12, 9.5 mg zinc, 200 mg calcium, and 1.5 g Calcium Beta-hydroxy-Beta-methylbutyrate to support wound healing -Continue Prosight daily  NUTRITION DIAGNOSIS:   Increased nutrient needs related to wound healing, chronic illness(osteomyelitis of right toe; HIV) as evidenced by estimated needs.  Ongoing.  GOAL:   Patient will meet greater than or equal to 90% of their needs  Progressing.  MONITOR:   PO intake, Labs, I & O's, Supplement acceptance, Weight trends, Skin  ASSESSMENT:   68 year old male with medical history significant of HTN, HLD, T2DM, asthma, GERD, gout, cocaine abuse, PE, HIV, CAD s/p CABG, s/p stent placment, A-fib, CKDIII, iron deficiency anemia who presents with chest and toe pain.  **RD working remotely**  Patient ate 100% of breakfast this morning. Pt eating a little better now. Pt is drinking Ensure Enlive supplements and taking 1 Juven daily. Per chart review, CBGs have been increasing. Will switch Ensure Enlive to Ensure Max. Will continue Juven for wound healing.   Admission weight: 160 lbs. Current weight: 161 lbs. I/Os: -7.1L since admit UOP: 1L x 24 hrs  Medications: Ferrous sulfate tablet, Folic acid tablet, Prosight MVI, Thiamine tablet Labs reviewed: CBGs: 149-244  Diet Order:   Diet Order            Diet Carb Modified Fluid consistency: Thin; Room service appropriate? Yes  Diet effective now              EDUCATION NEEDS:   No education needs have been identified at this time  Skin:  Skin Assessment: Skin Integrity Issues: Skin Integrity Issues::  Incisions Incisions: rt foot  Last BM:  10/19- type 3  Height:   Ht Readings from Last 1 Encounters:  07/21/19 5\' 8"  (1.727 m)    Weight:   Wt Readings from Last 1 Encounters:  07/28/19 73.4 kg    Ideal Body Weight:  70 kg  BMI:  Body mass index is 24.62 kg/m.  Estimated Nutritional Needs:   Kcal:  2200-2400  Protein:  110-120  Fluid:  >/= 2 L/day  Clayton Bibles, MS, RD, LDN Inpatient Clinical Dietitian Pager: [phone] After Hours Pager: [phone]

## 2019-07-28 NOTE — Progress Notes (Signed)
Physical Therapy Treatment Patient Details Name: Michael [name] MRN: [id number] DOB: 10-Jan-1951 Today's Date: 07/28/2019    History of Present Illness Pt adm with chest pain. Found to have osteomyelitis and underwent rt 2nd toe amputation on 10/14. PMH - covid, htn, HIV, cabg, dm, ckd, PE, cocaine abuse    PT Comments    Pt progressing with mobility but still unable to safely ambulate without physical assistance. SNF's not accepting pt due to drug history/use. Will switch focus to getting pt independent at w/c level since it appears he won't have support to amb safely.    Follow Up Recommendations  SNF(facilities are refusing pt so he will need maximal HH )     Equipment Recommendations  Rolling walker with 5" wheels;Wheelchair (measurements PT);Wheelchair cushion (measurements PT)(16x16 w/c)    Recommendations for Other Services       Precautions / Restrictions Precautions Precautions: Fall Restrictions Weight Bearing Restrictions: Yes RLE Weight Bearing: (WB orders are TDWB with minimzing pressure on MD note,) RLE Partial Weight Bearing Percentage or Pounds: minimize heel pressure    Mobility  Bed Mobility Overal bed mobility: Modified Independent Bed Mobility: Supine to Sit     Supine to sit: Modified independent (Device/Increase time);HOB elevated     General bed mobility comments: Incr time but no assistance needed  Transfers Overall transfer level: Needs assistance Equipment used: Rolling walker (2 wheeled);Ambulation equipment used Transfers: Sit to/from Stand Sit to Stand: Min assist         General transfer comment: Assist to bring hips up and for balance. Verbal cues for hand placement  Ambulation/Gait Ambulation/Gait assistance: Min assist Gait Distance (Feet): 25 Feet Assistive device: Rolling walker (2 wheeled) Gait Pattern/deviations: Step-to pattern;Decreased step length - right;Decreased step length - left;Decreased weight shift to  right Gait velocity: decr Gait velocity interpretation: <1.31 ft/sec, indicative of household ambulator General Gait Details: Assist for balance. Verbal cues for gait pattern and to minimize weight bearing on rt foot   Stairs             Wheelchair Mobility    Modified Rankin (Stroke Patients Only)       Balance Overall balance assessment: Needs assistance Sitting-balance support: No upper extremity supported;Feet supported Sitting balance-Leahy Scale: Good     Standing balance support: Bilateral upper extremity supported Standing balance-Leahy Scale: Poor Standing balance comment: walker and min assist for static standing                            Cognition Arousal/Alertness: Awake/alert Behavior During Therapy: WFL for tasks assessed/performed Overall Cognitive Status: Within Functional Limits for tasks assessed                                        Exercises      General Comments        Pertinent Vitals/Pain Pain Assessment: Faces Faces Pain Scale: Hurts even more Pain Location: rt foot Pain Descriptors / Indicators: Operative site guarding Pain Intervention(s): Limited activity within patient's tolerance;Monitored during session;Repositioned    Home Living                      Prior Function            PT Goals (current goals can now be found in the care plan section) Acute Rehab PT Goals Patient  Stated Goal: rehab so he can get home Progress towards PT goals: Progressing toward goals    Frequency    Min 3X/week      PT Plan Discharge plan needs to be updated;Frequency needs to be updated    Co-evaluation              AM-PAC PT "6 Clicks" Mobility   Outcome Measure  Help needed turning from your back to your side while in a flat bed without using bedrails?: None Help needed moving from lying on your back to sitting on the side of a flat bed without using bedrails?: None Help needed moving  to and from a bed to a chair (including a wheelchair)?: A Little Help needed standing up from a chair using your arms (e.g., wheelchair or bedside chair)?: A Little Help needed to walk in hospital room?: A Little Help needed climbing 3-5 steps with a railing? : Total 6 Click Score: 18    End of Session   Activity Tolerance: Patient limited by fatigue Patient left: in chair;with call bell/phone within reach;with chair alarm set Nurse Communication: Mobility status PT Visit Diagnosis: Other abnormalities of gait and mobility (R26.89);Muscle weakness (generalized) (M62.81);Pain Pain - Right/Left: Right Pain - part of body: Ankle and joints of foot     Time: 2010-0712 PT Time Calculation (min) (ACUTE ONLY): 19 min  Charges:  $Gait Training: 8-22 mins                     [place] Pager [phone] Office [place] 07/28/2019, 12:29 PM

## 2019-07-29 LAB — CBC
HCT: 27.5 % — ABNORMAL LOW (ref 39.0–52.0)
Hemoglobin: 8.6 g/dL — ABNORMAL LOW (ref 13.0–17.0)
MCH: 25.2 pg — ABNORMAL LOW (ref 26.0–34.0)
MCHC: 31.3 g/dL (ref 30.0–36.0)
MCV: 80.6 fL (ref 80.0–100.0)
Platelets: 263 10*3/uL (ref 150–400)
RBC: 3.41 MIL/uL — ABNORMAL LOW (ref 4.22–5.81)
RDW: 14.3 % (ref 11.5–15.5)
WBC: 4.7 10*3/uL (ref 4.0–10.5)
nRBC: 0 % (ref 0.0–0.2)

## 2019-07-29 LAB — COMPREHENSIVE METABOLIC PANEL
ALT: 24 U/L (ref 0–44)
AST: 18 U/L (ref 15–41)
Albumin: 2.7 g/dL — ABNORMAL LOW (ref 3.5–5.0)
Alkaline Phosphatase: 47 U/L (ref 38–126)
Anion gap: 7 (ref 5–15)
BUN: 26 mg/dL — ABNORMAL HIGH (ref 8–23)
CO2: 25 mmol/L (ref 22–32)
Calcium: 8.5 mg/dL — ABNORMAL LOW (ref 8.9–10.3)
Chloride: 103 mmol/L (ref 98–111)
Creatinine, Ser: 1.42 mg/dL — ABNORMAL HIGH (ref 0.61–1.24)
GFR calc Af Amer: 58 mL/min — ABNORMAL LOW (ref >60)
GFR calc non Af Amer: 50 mL/min — ABNORMAL LOW (ref >60)
Glucose, Bld: 219 mg/dL — ABNORMAL HIGH (ref 70–99)
Potassium: 4.2 mmol/L (ref 3.5–5.1)
Sodium: 135 mmol/L (ref 135–145)
Total Bilirubin: 0.3 mg/dL (ref 0.3–1.2)
Total Protein: 6.2 g/dL — ABNORMAL LOW (ref 6.5–8.1)

## 2019-07-29 LAB — GLUCOSE, CAPILLARY
Glucose-Capillary: 133 mg/dL — ABNORMAL HIGH (ref 70–99)
Glucose-Capillary: 258 mg/dL — ABNORMAL HIGH (ref 70–99)
Glucose-Capillary: 165 mg/dL — ABNORMAL HIGH (ref 70–99)
Glucose-Capillary: 198 mg/dL — ABNORMAL HIGH (ref 70–99)

## 2019-07-29 LAB — MAGNESIUM: Magnesium: 2.2 mg/dL (ref 1.7–2.4)

## 2019-07-29 NOTE — Progress Notes (Signed)
No orders currently for Rt foot drsg change. Dr Jess Barters office called. Message left to clarify drsg change schedule.

## 2019-07-29 NOTE — Progress Notes (Signed)
Physical Therapy Treatment Patient Details Name: Michael [name] MRN: [id number] DOB: [birth year] Today's Date: 07/29/2019    History of Present Illness Pt adm with chest pain. Found to have osteomyelitis and underwent rt 2nd toe amputation on 10/14. PMH - covid, htn, HIV, cabg, dm, ckd, PE, cocaine abuse    PT Comments    Pt making steady progress with mobility. Pt with improving gait with walker and worked on w/c mobility and transfers since pt will need w/c for extended mobility. Discharge disposition continues to be difficult since SNF's are refusing pt due to drug history and pt doesn't have stable home. Continue to work toward maximizing independence and safety.   Follow Up Recommendations  SNF;Other (comment)(facilities are refusing pt so he will need maximal HH )     Equipment Recommendations  Rolling walker with 5" wheels;Wheelchair (measurements PT);Wheelchair cushion (measurements PT)(16x16 w/c)    Recommendations for Other Services       Precautions / Restrictions Precautions Precautions: Fall Restrictions Weight Bearing Restrictions: Yes RLE Weight Bearing: (WB orders are TDWB with minimzing pressure on MD note,) RLE Partial Weight Bearing Percentage or Pounds: minimize heel pressure    Mobility  Bed Mobility               General bed mobility comments: Pt up in recliner  Transfers Overall transfer level: Needs assistance Equipment used: Rolling walker (2 wheeled);Ambulation equipment used Transfers: Sit to/from [employer] Sit to Stand: Min guard   Squat pivot transfers: Min guard     General transfer comment: Performed recliner to w/c to recliner with squat pivot with min guard for safety. Sit to stand with walker with min guard for safety  Ambulation/Gait Ambulation/Gait assistance: Min guard Gait Distance (Feet): 50 Feet Assistive device: Rolling walker (2 wheeled) Gait Pattern/deviations: Step-to pattern;Decreased step  length - right;Decreased step length - left;Decreased weight shift to right Gait velocity: decr Gait velocity interpretation: <1.31 ft/sec, indicative of household ambulator General Gait Details: Assist for safety. Occasional verbal cue for gait sequence and to minimize weight bearing on rt foot.    Stairs             Information systems manager mobility: Yes Wheelchair propulsion: Both lower extermities Wheelchair parts: Needs assistance Distance: 75 Wheelchair Assistance Details (indicate cue type and reason): Verbal cues for managment of legrests, brakes, and steering and pushing of w/c  Modified Rankin (Stroke Patients Only)       Balance Overall balance assessment: Needs assistance Sitting-balance support: No upper extremity supported;Feet supported Sitting balance-Leahy Scale: Good     Standing balance support: Bilateral upper extremity supported Standing balance-Leahy Scale: Poor Standing balance comment: walker and min guard assist for static standing                            Cognition Arousal/Alertness: Awake/alert Behavior During Therapy: WFL for tasks assessed/performed Overall Cognitive Status: Within Functional Limits for tasks assessed                                        Exercises      General Comments        Pertinent Vitals/Pain Pain Assessment: Faces Faces Pain Scale: Hurts even more Pain Location: rt foot Pain Descriptors / Indicators: Operative site guarding Pain Intervention(s): Limited activity within patient's tolerance;Monitored during session;Repositioned  Home Living                      Prior Function            PT Goals (current goals can now be found in the care plan section) Progress towards PT goals: Goals met and updated - see care plan    Frequency    Min 3X/week      PT Plan Current plan remains appropriate    Co-evaluation               AM-PAC PT "6 Clicks" Mobility   Outcome Measure  Help needed turning from your back to your side while in a flat bed without using bedrails?: None Help needed moving from lying on your back to sitting on the side of a flat bed without using bedrails?: None Help needed moving to and from a bed to a chair (including a wheelchair)?: A Little Help needed standing up from a chair using your arms (e.g., wheelchair or bedside chair)?: A Little Help needed to walk in hospital room?: A Little Help needed climbing 3-5 steps with a railing? : Total 6 Click Score: 18    End of Session   Activity Tolerance: Patient tolerated treatment well Patient left: in chair;with call bell/phone within reach;with chair alarm set Nurse Communication: Mobility status PT Visit Diagnosis: Other abnormalities of gait and mobility (R26.89);Muscle weakness (generalized) (M62.81);Pain Pain - Right/Left: Right Pain - part of body: Ankle and joints of foot     Time: 0910-0950 PT Time Calculation (min) (ACUTE ONLY): 40 min  Charges:  $Gait Training: 8-22 mins $Wheel Chair Management: 23-37 mins                     [place] Pager [phone] Office [place] 07/29/2019, 1:22 PM

## 2019-07-29 NOTE — Evaluation (Signed)
Occupational Therapy Evaluation Patient Details Name: Michael [name] MRN: [id number] DOB: May 25, 1951 Today's Date: 07/29/2019    History of Present Illness Pt adm with chest pain. Found to have osteomyelitis and underwent rt 2nd toe amputation on 10/14. PMH - covid, htn, HIV, cabg, dm, ckd, PE, cocaine abuse   Clinical Impression   Pt with decline in function and safety with ADLs and ADL mobility with impaired strength, balance and endurance. Pt reports that PTA he was independent and was living with relatives but that now he is homeless. Pt currently requires mod A with LB ADLs, min A with toileting and min guard A with functional mobility using RW    Follow Up Recommendations  SNF;Other (comment)(If SNFs declining pt, will need max HH)    Equipment Recommendations  Other (comment)(TBD at next venue of care)    Recommendations for Other Services       Precautions / Restrictions Precautions Precautions: Fall Required Braces or Orthoses: Other Brace Other Brace: post op shoe Restrictions Weight Bearing Restrictions: Yes RLE Weight Bearing: (WB orders are TDWB with minimzing pressure on MD note,) RLE Partial Weight Bearing Percentage or Pounds: minimize heel pressure      Mobility Bed Mobility               General bed mobility comments: Pt up in recliner  Transfers Overall transfer level: Needs assistance Equipment used: Rolling walker (2 wheeled);Ambulation equipment used Transfers: Sit to/from [employer] Sit to Stand: Min guard Stand pivot transfers: Min guard Squat pivot transfers: Min guard     General transfer comment: min guard A for safety    Balance Overall balance assessment: Needs assistance Sitting-balance support: No upper extremity supported;Feet supported Sitting balance-Leahy Scale: Good     Standing balance support: Bilateral upper extremity supported Standing balance-Leahy Scale: Poor Standing balance comment: walker  and min guard assist for static standing                           ADL either performed or assessed with clinical judgement   ADL Overall ADL's : Needs assistance/impaired Eating/Feeding: Independent;Sitting   Grooming: Wash/dry hands;Wash/dry face;Set up;Sitting   Upper Body Bathing: Set up;Sitting   Lower Body Bathing: Moderate assistance   Upper Body Dressing : Set up;Sitting   Lower Body Dressing: Moderate assistance   Toilet Transfer: Min guard;Ambulation;RW;BSC   Toileting- Clothing Manipulation and Hygiene: Minimal assistance;Sit to/from stand       Functional mobility during ADLs: Min guard;Rolling walker       Vision Patient Visual Report: No change from baseline       Perception     Praxis      Pertinent Vitals/Pain Pain Assessment: 0-10 Pain Score: 5  Faces Pain Scale: Hurts even more Pain Location: R foot Pain Descriptors / Indicators: Operative site guarding Pain Intervention(s): Limited activity within patient's tolerance;Monitored during session;Repositioned     Hand Dominance Right   Extremity/Trunk Assessment Upper Extremity Assessment Upper Extremity Assessment: Generalized weakness   Lower Extremity Assessment Lower Extremity Assessment: Defer to PT evaluation   Cervical / Trunk Assessment Cervical / Trunk Assessment: Normal   Communication Communication Communication: No difficulties   Cognition Arousal/Alertness: Awake/alert Behavior During Therapy: WFL for tasks assessed/performed Overall Cognitive Status: Within Functional Limits for tasks assessed  General Comments       Exercises     Shoulder Instructions      Home Living Family/patient expects to be discharged to:: Shelter/Homeless Living Arrangements: Other relatives                               Additional Comments: was living in hotel      Prior Functioning/Environment Level of  Independence: Independent with assistive device(s)        Comments: Amb with cane        OT Problem List: Impaired balance (sitting and/or standing);Decreased activity tolerance;Pain;Decreased knowledge of use of DME or AE      OT Treatment/Interventions: Self-care/ADL training;DME and/or AE instruction;Therapeutic activities;Patient/family education    OT Goals(Current goals can be found in the care plan section) Acute Rehab OT Goals Patient Stated Goal: rehab so he can get home OT Goal Formulation: With patient Time For Goal Achievement: 08/12/19 Potential to Achieve Goals: Good ADL Goals Pt Will Perform Grooming: with min guard assist;standing Pt Will Perform Lower Body Bathing: with min assist Pt Will Perform Lower Body Dressing: with min assist Pt Will Transfer to Toilet: with supervision;ambulating Pt Will Perform Toileting - Clothing Manipulation and hygiene: with min guard assist;sit to/from stand  OT Frequency: Min 2X/week   Barriers to D/C: Decreased caregiver support          Co-evaluation              AM-PAC OT "6 Clicks" Daily Activity     Outcome Measure Help from another person eating meals?: None Help from another person taking care of personal grooming?: A Little Help from another person toileting, which includes using toliet, bedpan, or urinal?: A Little Help from another person bathing (including washing, rinsing, drying)?: A Lot Help from another person to put on and taking off regular upper body clothing?: A Little Help from another person to put on and taking off regular lower body clothing?: A Lot 6 Click Score: 17   End of Session Equipment Utilized During Treatment: Gait belt;Rolling walker;Other (comment)(BSC)  Activity Tolerance: Patient tolerated treatment well Patient left: in chair;with call bell/phone within reach;with chair alarm set  OT Visit Diagnosis: Unsteadiness on feet (R26.81);Muscle weakness (generalized)  (M62.81);Pain Pain - Right/Left: Right Pain - part of body: Ankle and joints of foot                Time: 1036-1101 OT Time Calculation (min): 25 min Charges:  OT General Charges $OT Visit: 1 Visit OT Evaluation $OT Eval Moderate Complexity: 1 Mod OT Treatments $Self Care/Home Management : 8-22 mins    Britt Bottom 07/29/2019, 1:53 PM

## 2019-07-29 NOTE — Progress Notes (Signed)
Inpatient Diabetes Program Recommendations  AACE/ADA: New Consensus Statement on Inpatient Glycemic Control (2015)  Target Ranges:  Prepandial:   less than 140 mg/dL      Peak postprandial:   less than 180 mg/dL (1-2 hours)      Critically ill patients:  140 - 180 mg/dL   Lab Results  Component Value Date   GLUCAP 258 (H) 07/29/2019   HGBA1C 9.7 (H) 07/17/2019    Review of Glycemic Control Results for LENWOOD, BALSAM (MRN [id number]) as of 07/29/2019 12:43  Ref. Range 07/28/2019 08:53 07/28/2019 11:34 07/28/2019 15:58 07/28/2019 22:11 07/29/2019 07:30 07/29/2019 11:09  Glucose-Capillary Latest Ref Range: 70 - 99 mg/dL 149 (H) 244 (H) 292 (H) 215 (H) 133 (H)  Novolog 4 units tid (included meal coverage)  Lantus 25 units given 258 (H)   Diabetes history: DM 2 Outpatient Diabetes medications:  Current orders for Inpatient glycemic control:  Lantus 25 units Daily Novolog 0-9 units tid + hs Novolog 3 units tid meal coverage  Ensure Max (does not fluctuate glucose)  Inpatient Diabetes Program Recommendations:    Glucose trends increase with Novolog 3 units meal coverage. If postprandial glucose trends continue to increase, consider increasing meal coverage to Novolog 5 units tid if pt consuming at least 50% of meals.  Thanks,  Tama Headings RN, MSN, BC-ADM Inpatient Diabetes Coordinator Team Pager [phone] (8a-5p)

## 2019-07-29 NOTE — TOC Progression Note (Signed)
Transition of Care [facility]) - Progression Note    Patient Details  Name: Michael [name] MRN: [id number] Date of Birth: [birth year]  Transition of Care [facility]) CM/SW Contact  Graves-Bigelow, Ocie Cornfield, RN Phone Number: 07/29/2019, 12:58 PM  Clinical Narrative: CM discussed with patient to continue to call his family regarding if he can stay with them. Only other alternative will be shelter vs. back to the motel. CM did try to reach out to the patient's brother and was unsuccessful- voicemail was left. Will follow for additional transition of care needs.    Expected Discharge Plan: [place] Barriers to Discharge: Continued Medical Work up(Plan for toe amputation on Wednesday 07-21-19)  Expected Discharge Plan and Services Expected Discharge Plan: [place]   Discharge Planning Services: CM Consult   Living arrangements for the past 2 months: Hotel/Motel                   Social Determinants of Health (SDOH) Interventions    Readmission Risk Interventions Readmission Risk Prevention Plan 07/19/2019  Transportation Screening Complete  Medication Review ([place]) Complete  HRI or Home Care Consult Complete  Palliative Care Screening Not Applicable  Some recent data might be hidden

## 2019-07-29 NOTE — Progress Notes (Signed)
PROGRESS NOTE    Michael [name]  CVE:[id number] DOB: 06/09/1951 DOA: 07/16/2019 PCP: [name], PA-C   Brief Narrative:  Michael [name] 68 y.o.malewith medical history significant ofhypertension, hyperlipidemia, diabetes mellitus, asthma, GERD, gout, depression, cocaine abuse, PE not on anticoagulants, HIV, CAD,s/po ofCABG,s/p ofstent placement, atrial fibrillation not on anticoagulants, CKD stage III, iron deficiency anemia, who presented with chest pain.  Per patient, he has been having chest pain intermittently for more than Michael [name] year since he had his CABG surgery but his pain has worsened in the last 2 days.  This was located at substernal area.  Aggravated with cough and deep breaths.  Patient recently had long distant travel to [place] Michael [name] month ago.  No other symptoms.  He used cocaine day prior to admission.  Also had swelling and pain in right second toe.  Upon arrival to ED, pt was found to have troponin 102, WBC 6.4, negative COVID-19 test, renal function close to baseline, temperature normal, blood pressure 145/86, heart rate 72, oxygen saturation 99% on room air. Chest x-ray is negative. X-ray of right foot showed possible distal second toe osteomyelitis.  Patient was admitted under hospitalist service.  He was ruled out of MI with negative cardiac enzymes.  MRI of right foot confirmed right second toe osteomyelitis and possible abscess.  Patient was seen by Dr. [name] and underwent right second toe ray amputation on 07/21/2019.  Patient was evaluated by PT OT and they recommended SNF.  Case manager on board.  Assessment & Plan:   Principal Problem:   Chest pain Active Problems:   HIV (human immunodeficiency virus infection) ([place])   Hyperlipidemia with target LDL less than 100   3-vessel CAD   Severe recurrent major depressive disorder with psychotic features ([name])   Cocaine use disorder, severe, dependence ([place])   Gout   Essential hypertension  Type II diabetes mellitus with renal manifestations ([name])   GERD (gastroesophageal reflux disease)   S/P CABG (coronary artery bypass graft)   Chronic kidney disease, stage III (moderate)   Paroxysmal Jamir Rone-fib ([name])   Toe pain, right-second   Osteomyelitis ([name])   Abscess or cellulitis of toe, right   Osteomyelitis of right second toe: MRI confirms osteomyelitis throughout distal phalanx of second toe with some fluid collection surrounding distal phalanx, consistent with an abscess.  Blood cultures have remained negative. S/p right second toe ray amputation on 10/14.  Antibiotics stopped after 24 hours of amputation.  PT OT saw patient.  They recommend SNF.  Case manager working on that however this has been challenging due to his history of cocaine abuse.  Pt says he knows he can't manage on his own.  Will try to touch base with family if pt agreeable (pt agreeable to me calling brother, Michael [name] today).  Chest pain with history of CAD/three-vessel disease: Cardiac enzymes negative.  His chest pain is chronic since he had CABG.  He has point tenderness.  EKG with no changes.  No further work-up indicated.   HIV: CD4 count 270 on 09/16/2018.  Continue home medications.  Follow patient.  Hyperlipidemia: Not on any medications.  Cocaine use disorder: Counseling regarding cessation provided.  Essential hypertension: Controlled.  Continue oral Cardizem.  Continue as needed hydralazine.  Type 2 diabetes mellitus: Blood sugar elevated again during day.  Continue 25 units lantus, add mealtime insulin.  Paroxysmal Michael [name]. Fib: Patient's cha2ds2 vascular 2 score is 4 but he is not on any anticoagulation.  Reason unknown. ?  Noncompliance.  Question of lack of capacity on 10/20, but when I discussed this with him he appeared to have capacity and was able to teach back to me anticoagulation.  He may have limited medical literacy, but at this time, I believe he has capacity.  As above, will attempt to  discuss with family.  Will hold off on anticoagulation at this point and try to look into hx [name] bit more (EKG from presentation with sinus arrhythmia).    Anemia of chronic disease: His baseline seems to be between 9 and 10.  Currently at baseline.  Watch daily.  CKD stage III: Back to his baseline of around 1.5.  Diarrhea: Resolved.  DVT prophylaxis: heparin  Code Status: full  Family Communication: none at bedside Disposition Plan: pending safe discharge plan   Consultants:   orthopedics  Procedures:   R second toe amputation on 10/14  Antimicrobials:  Anti-infectives (From admission, onward)   Start     Dose/Rate Route Frequency Ordered Stop   07/21/19 0600  ceFAZolin (ANCEF) IVPB 2g/100 mL premix     2 g 200 mL/hr over 30 Minutes Intravenous On call to O.R. 07/20/19 1928 07/21/19 1115   07/17/19 1100  cefTRIAXone (ROCEPHIN) 2 g in sodium chloride 0.9 % 100 mL IVPB     2 g 200 mL/hr over 30 Minutes Intravenous Every 24 hours 07/17/19 1017 07/22/19 1306   07/17/19 1100  metroNIDAZOLE (FLAGYL) IVPB 500 mg     500 mg 100 mL/hr over 60 Minutes Intravenous Every 8 hours 07/17/19 1017 07/22/19 2344   07/17/19 0800  emtricitabine-rilpivir-tenofovir AF (ODEFSEY) 200-25-25 MG per tablet 1 tablet    Note to Pharmacy: For HIV infection     1 tablet Oral Daily with breakfast 07/16/19 2258       Subjective: Foot pain with walking. He's ok with me touching base with family. Doesn't think he can manage on his own.  Objective: Vitals:   07/28/19 2110 07/29/19 0649 07/29/19 0757 07/29/19 0900  BP: 131/82 133/76    Pulse: 88 77    Resp: 16 14 13 16   Temp: 98 F (36.7 C) 98.4 F (36.9 C)    TempSrc: Oral Oral    SpO2: 100% 100%    Weight:  74.3 kg    Height:        Intake/Output Summary (Last 24 hours) at 07/29/2019 1018 Last data filed at 07/28/2019 2215 Gross per 24 hour  Intake 1230 ml  Output 850 ml  Net 380 ml   Filed Weights   07/27/19 0537 07/28/19 0635  07/29/19 0649  Weight: 74.1 kg 73.4 kg 74.3 kg    Examination:  General: No acute distress. Cardiovascular: Heart sounds show Currie Dennin regular rate, and rhythm. Lungs: Clear to auscultation bilaterally. Abdomen: Soft, nontender, nondistended Neurological: Alert and oriented 3. Moves all extremities 4. Cranial nerves II through XII grossly intact. Skin: Warm and dry. No rashes or lesions. Extremities: R foot in dressing     Data Reviewed: I have personally reviewed following labs and imaging studies  CBC: Recent Labs  Lab 07/24/19 0411 07/25/19 0452 07/26/19 0639 07/27/19 0350 07/28/19 0400 07/29/19 0303  WBC 5.2 10.3 6.1 5.0 4.1 4.7  NEUTROABS 3.2 8.2* 4.4 2.8 2.2  --   HGB 9.4* 11.0* 8.9* 9.5* 9.1* 8.6*  HCT 29.5* 34.3* 28.2* 30.8* 28.1* 27.5*  MCV 80.6 80.1 80.3 82.1 80.1 80.6  PLT 227 225 243 194 225 588   Basic Metabolic Panel: Recent Labs  Lab  07/24/19 1134 07/25/19 0452 07/26/19 0639 07/28/19 0937 07/29/19 0303  NA 136 134* 136 137 135  K 4.3 4.7 4.2 4.2 4.2  CL 100 98 101 102 103  CO2 27 24 26 25 25   GLUCOSE 135* 208* 272* 182* 219*  BUN 24* 27* 32* 24* 26*  CREATININE 1.37* 1.38* 1.55* 1.45* 1.42*  CALCIUM 9.4 9.5 8.9 9.0 8.5*  MG  --   --   --   --  2.2   GFR: Estimated Creatinine Clearance: 47.5 mL/min (Olivianna Higley) (by C-G formula based on SCr of 1.42 mg/dL (H)). Liver Function Tests: Recent Labs  Lab 07/23/19 0248 07/29/19 0303  AST 71* 18  ALT 48* 24  ALKPHOS 51 47  BILITOT 0.5 0.3  PROT 6.5 6.2*  ALBUMIN 3.0* 2.7*   No results for input(s): LIPASE, AMYLASE in the last 168 hours. No results for input(s): AMMONIA in the last 168 hours. Coagulation Profile: No results for input(s): INR, PROTIME in the last 168 hours. Cardiac Enzymes: No results for input(s): CKTOTAL, CKMB, CKMBINDEX, TROPONINI in the last 168 hours. BNP (last 3 results) No results for input(s): PROBNP in the last 8760 hours. HbA1C: No results for input(s): HGBA1C in the last 72  hours. CBG: Recent Labs  Lab 07/28/19 0853 07/28/19 1134 07/28/19 1558 07/28/19 2211 07/29/19 0730  GLUCAP 149* 244* 292* 215* 133*   Lipid Profile: No results for input(s): CHOL, HDL, LDLCALC, TRIG, CHOLHDL, LDLDIRECT in the last 72 hours. Thyroid Function Tests: No results for input(s): TSH, T4TOTAL, FREET4, T3FREE, THYROIDAB in the last 72 hours. Anemia Panel: No results for input(s): VITAMINB12, FOLATE, FERRITIN, TIBC, IRON, RETICCTPCT in the last 72 hours. Sepsis Labs: No results for input(s): PROCALCITON, LATICACIDVEN in the last 168 hours.  Recent Results (from the past 240 hour(s))  Surgical PCR screen     Status: None   Collection Time: 07/19/19  4:30 PM   Specimen: Nasal Mucosa; Nasal Swab  Result Value Ref Range Status   MRSA, PCR NEGATIVE NEGATIVE Final   Staphylococcus aureus NEGATIVE NEGATIVE Final    Comment: (NOTE) The Xpert SA Assay (FDA approved for NASAL specimens in patients 30 years of age and older), is one component of Riti Rollyson comprehensive surveillance program. It is not intended to diagnose infection nor to guide or monitor treatment. Performed at [facility], [place] [address], [place], [place] [address]          Radiology Studies: No results found.      Scheduled Meds: . allopurinol  300 mg Oral Daily  . aspirin  81 mg Oral Daily  . colchicine  0.6 mg Oral Daily  . diltiazem  120 mg Oral Daily  . docusate sodium  100 mg Oral BID  . emtricitabine-rilpivir-tenofovir AF  1 tablet Oral Q breakfast  . famotidine  20 mg Oral Daily  . ferrous sulfate  325 mg Oral BID WC  . folic acid  1 mg Oral Daily  . gabapentin  400 mg Oral BID  . heparin injection (subcutaneous)  5,000 Units Subcutaneous Q8H  . insulin aspart  0-5 Units Subcutaneous QHS  . insulin aspart  0-9 Units Subcutaneous TID WC  . insulin aspart  3 Units Subcutaneous TID WC  . insulin glargine  25 Units Subcutaneous Daily  . multivitamin  1 tablet Oral Daily  . nutrition  supplement (JUVEN)  1 packet Oral BID BM  . Ensure Max Protein  11 oz Oral Daily  . risperiDONE  0.25 mg Oral  BID  . sertraline  50 mg Oral Daily  . sodium chloride flush  3 mL Intravenous Once  . thiamine  100 mg Oral Daily   Continuous Infusions: . sodium chloride    . lactated ringers 10 mL/hr at 07/21/19 1021  . methocarbamol (ROBAXIN) IV       LOS: 12 days    Time spent: over 30 min    Fayrene Helper, MD [facility] Pager AMION  If 7PM-7AM, please contact night-coverage [email or web address] Password [email or web address] 07/29/2019, 10:18 AM

## 2019-07-30 LAB — COMPREHENSIVE METABOLIC PANEL
ALT: 20 U/L (ref 0–44)
AST: 29 U/L (ref 15–41)
Albumin: 2.7 g/dL — ABNORMAL LOW (ref 3.5–5.0)
Alkaline Phosphatase: 43 U/L (ref 38–126)
Anion gap: 10 (ref 5–15)
BUN: 26 mg/dL — ABNORMAL HIGH (ref 8–23)
CO2: 21 mmol/L — ABNORMAL LOW (ref 22–32)
Calcium: 8.5 mg/dL — ABNORMAL LOW (ref 8.9–10.3)
Chloride: 106 mmol/L (ref 98–111)
Creatinine, Ser: 1.5 mg/dL — ABNORMAL HIGH (ref 0.61–1.24)
GFR calc Af Amer: 54 mL/min — ABNORMAL LOW (ref >60)
GFR calc non Af Amer: 47 mL/min — ABNORMAL LOW (ref >60)
Glucose, Bld: 167 mg/dL — ABNORMAL HIGH (ref 70–99)
Potassium: 5.2 mmol/L — ABNORMAL HIGH (ref 3.5–5.1)
Sodium: 137 mmol/L (ref 135–145)
Total Bilirubin: 0.1 mg/dL — ABNORMAL LOW (ref 0.3–1.2)
Total Protein: 5.9 g/dL — ABNORMAL LOW (ref 6.5–8.1)

## 2019-07-30 LAB — CBC
HCT: 27.5 % — ABNORMAL LOW (ref 39.0–52.0)
Hemoglobin: 8.3 g/dL — ABNORMAL LOW (ref 13.0–17.0)
MCH: 25.1 pg — ABNORMAL LOW (ref 26.0–34.0)
MCHC: 30.2 g/dL (ref 30.0–36.0)
MCV: 83.1 fL (ref 80.0–100.0)
Platelets: 257 10*3/uL (ref 150–400)
RBC: 3.31 MIL/uL — ABNORMAL LOW (ref 4.22–5.81)
RDW: 14.4 % (ref 11.5–15.5)
WBC: 3.7 10*3/uL — ABNORMAL LOW (ref 4.0–10.5)
nRBC: 0 % (ref 0.0–0.2)

## 2019-07-30 LAB — GLUCOSE, CAPILLARY
Glucose-Capillary: 122 mg/dL — ABNORMAL HIGH (ref 70–99)
Glucose-Capillary: 243 mg/dL — ABNORMAL HIGH (ref 70–99)
Glucose-Capillary: 186 mg/dL — ABNORMAL HIGH (ref 70–99)
Glucose-Capillary: 209 mg/dL — ABNORMAL HIGH (ref 70–99)

## 2019-07-30 LAB — MAGNESIUM: Magnesium: 2.2 mg/dL (ref 1.7–2.4)

## 2019-07-30 LAB — POTASSIUM: Potassium: 4.9 mmol/L (ref 3.5–5.1)

## 2019-07-30 MED ORDER — HYDROXYZINE HCL 25 MG PO TABS: 25.0000 mg | ORAL_TABLET | Freq: Three times a day (TID) | ORAL | 0 refills | Status: AC | PRN

## 2019-07-30 MED ORDER — ASPIRIN 81 MG PO CHEW: 81.0000 mg | CHEWABLE_TABLET | Freq: Every day | ORAL | 0 refills | Status: AC

## 2019-07-30 MED ORDER — RISPERIDONE 0.25 MG PO TABS: 0.2500 mg | ORAL_TABLET | Freq: Two times a day (BID) | ORAL | 0 refills | Status: DC

## 2019-07-30 MED ORDER — METFORMIN HCL 1000 MG PO TABS: 1000.0000 mg | ORAL_TABLET | Freq: Every day | ORAL | 0 refills | Status: DC

## 2019-07-30 MED ORDER — FOLIC ACID 1 MG PO TABS: 1.0000 mg | ORAL_TABLET | Freq: Every day | ORAL | 0 refills | Status: DC

## 2019-07-30 MED ORDER — FAMOTIDINE 20 MG PO TABS: 20.0000 mg | ORAL_TABLET | Freq: Two times a day (BID) | ORAL | 0 refills | Status: DC

## 2019-07-30 MED ORDER — INSULIN GLARGINE 100 UNIT/ML SOLOSTAR PEN: 20.0000 [IU] | PEN_INJECTOR | Freq: Every day | SUBCUTANEOUS | 0 refills | Status: DC

## 2019-07-30 MED ORDER — ALLOPURINOL 300 MG PO TABS: 300.0000 mg | ORAL_TABLET | Freq: Every day | ORAL | 0 refills | Status: DC

## 2019-07-30 MED ORDER — FERROUS SULFATE 325 (65 FE) MG PO TABS: 325.0000 mg | ORAL_TABLET | Freq: Two times a day (BID) | ORAL | 0 refills | Status: DC

## 2019-07-30 MED ORDER — GABAPENTIN 400 MG PO CAPS: 400.0000 mg | ORAL_CAPSULE | Freq: Two times a day (BID) | ORAL | 0 refills | Status: DC

## 2019-07-30 MED ORDER — SERTRALINE HCL 50 MG PO TABS: 50.0000 mg | ORAL_TABLET | Freq: Every day | ORAL | 0 refills | Status: DC

## 2019-07-30 MED ORDER — DILTIAZEM HCL ER COATED BEADS 120 MG PO CP24: 120.0000 mg | ORAL_CAPSULE | Freq: Every day | ORAL | 0 refills | Status: DC

## 2019-07-30 MED ORDER — TRAZODONE HCL 100 MG PO TABS: 100.0000 mg | ORAL_TABLET | Freq: Every evening | ORAL | 0 refills | Status: DC | PRN

## 2019-07-30 MED ORDER — THIAMINE HCL 100 MG PO TABS: 100.0000 mg | ORAL_TABLET | Freq: Every day | ORAL | 0 refills | Status: DC

## 2019-07-30 MED ORDER — EMTRICITAB-RILPIVIR-TENOFOV AF 200-25-25 MG PO TABS: 1.0000 | ORAL_TABLET | Freq: Every day | ORAL | 0 refills | Status: AC

## 2019-07-30 MED FILL — SERTRALINE HCL 50 MG TABLET: 50 | 30 days supply | Qty: 30 | Fill #0

## 2019-07-30 MED FILL — ODEFSEY 200-25-25 MG TABS: 200-25-25 | 30 days supply | Qty: 30 | Fill #0

## 2019-07-30 MED FILL — risperiDONE 0.25 MG TABS: 0.25 | 30 days supply | Qty: 60 | Fill #0

## 2019-07-30 MED FILL — ASPIRIN LOW DOSE 81 MG CHEW: 81 | 30 days supply | Qty: 30 | Fill #0

## 2019-07-30 MED FILL — FERROUS SULFATE 325 MG TAB: 325 (65 FE) | 30 days supply | Qty: 60 | Fill #0

## 2019-07-30 MED FILL — LANTUS SOLOSTAR 100 UNITS/M: 100 | 30 days supply | Qty: 6 | Fill #0

## 2019-07-30 MED FILL — hydrOXYzine HCL 25 MG TABS: 25 | 30 days supply | Qty: 90 | Fill #0

## 2019-07-30 MED FILL — CARTIA XT 120 MG CP24: 120 | 30 days supply | Qty: 30 | Fill #0

## 2019-07-30 MED FILL — metFORMIN HCL 1000 MG TABS: 1000 | 30 days supply | Qty: 30 | Fill #0

## 2019-07-30 MED FILL — PENTIPS 31G X 8 MM MISC: 31G X 8 MM | 30 days supply | Qty: 100 | Fill #0

## 2019-07-30 MED FILL — ALLOPURINOL 100 MG TABLET: 100 | 30 days supply | Qty: 90 | Fill #0

## 2019-07-30 NOTE — TOC Transition Note (Signed)
Transition of Care [facility]) - CM/SW Discharge Note   Patient Details  Name: Michael [name] MRN: [id number] Date of Birth: 1951-04-03  Transition of Care [facility]) CM/SW Contact:  Bethena Roys, RN Phone Number: 07/30/2019, 4:50 PM   Clinical Narrative: PCP appointment established. Patient has spoken with brother regarding housing situation. Brother is agreeable to have patient come and stay with him. PT recommendations for [facility] and DME. DME was ordered via [employer] and delivered to the room. CM discussed Medicare.gov list with patient- first choice [facility]. [facility], [facility], [facility], [facility] unable to accept the patient. CM called all home health agencies on the Medicare. Gov list- no agency able to take the patient. CM did reach out to Interim Director Case Management- Rhonda Rumple for guidance. We discussed outpatient PT and outpatient wound care. CM did call the [facility] and was told that patient has to see MD [name] first-then his office will need to make a referral to the [facility]. CM did discuss plan of care with outpatient PT and wound care with MD Florene Glen- he felt like this was not a good plan and wants to re-address on Monday. CM will continue to follow for additional transition of care needs.       Final next level of care: Home/Self Care Barriers to Discharge: No Barriers Identified(unable to set patient up with [facility] agency- pt will need outpatient PT- Ambulatory referral sent.)   Patient Goals and CMS Choice Patient states their goals for this hospitalization and ongoing recovery are:: "to regain strength" CMS Medicare.gov Compare Post Acute Care list provided to:: Patient Choice offered to / list presented to : Patient   Discharge Plan and Services In-house Referral: NA Discharge Planning Services: CM Consult, Follow-up appt scheduled Post Acute Care Choice: Home Health          DME Arranged: Wheelchair manual, Walker rolling DME Agency:  [employer] Date DME Agency Contacted: 07/30/19 Time DME Agency Contacted: 1500 Representative spoke with at DME Agency: Thedore Mins HH Arranged: RN, Disease Management, PT, OT, Nurse's Aide          Social Determinants of Health (SDOH) Interventions     Readmission Risk Interventions Readmission Risk Prevention Plan 07/19/2019  Transportation Screening Complete  Medication Review Press photographer) Complete  HRI or Home Care Consult Complete  Palliative Care Screening Not Applicable  Some recent data might be hidden

## 2019-07-30 NOTE — Plan of Care (Signed)
  Problem: Education: Goal: Knowledge of General Education information will improve Description: Including pain rating scale, medication(s)/side effects and non-pharmacologic comfort measures Outcome: Progressing   Problem: Safety: Goal: Ability to remain free from injury will improve Outcome: Progressing   Problem: Skin Integrity: Goal: Risk for impaired skin integrity will decrease Outcome: Progressing   

## 2019-07-30 NOTE — Care Management (Cosign Needed)
    Durable Medical Equipment  (From admission, onward)         Start     Ordered   07/30/19 1406  For home use only DME Walker rolling  [facility])  Once    Question:  Patient needs a walker to treat with the following condition  Answer:  Osteomyelitis ([place])   07/30/19 1408   07/30/19 1406  For home use only DME lightweight manual wheelchair with seat cushion  (Wheelchairs)  Once    Comments: Patient suffers from right second toe osteomyelitis s/p  which impairs their ability to perform daily activities like bathing, dressing and toileting in the home.  A walker will not resolve  issue with performing activities of daily living. A wheelchair will allow patient to safely perform daily activities. Patient is not able to propel themselves in the home using a standard weight wheelchair due to general weakness. Patient can self propel in the lightweight wheelchair. Length of need 6 months . Accessories: elevating leg rests (ELRs), wheel locks, extensions and anti-tippers.   07/30/19 1408   07/30/19 1405  For home use only DME wheelchair cushion (seat and back)  (Wheelchairs)  Once     07/30/19 1408

## 2019-07-30 NOTE — Progress Notes (Signed)
Physical Therapy Treatment Patient Details Name: Michael [name] MRN: [id number] DOB: 02-24-1951 Today's Date: 07/30/2019    History of Present Illness Pt adm with chest pain. Found to have osteomyelitis and underwent rt 2nd toe amputation on 10/14. PMH - covid, htn, HIV, cabg, dm, ckd, PE, cocaine abuse    PT Comments    Patient seen for mobility progression. Pt is making progress toward PT goals and tolerated gait/stair training well. Pt reports he will be discharging to his brother's apartment that has 2 steps to enter. Pt requires min guard for functional transfer and gait and min A for negotiating stairs. Pt demonstrates good adherence to R LE weight bearing precautions. Pt will continue to benefit from further skilled PT services to maximize independence and safety with mobility.     Follow Up Recommendations  Home health PT;Supervision for mobility/OOB     Equipment Recommendations  Rolling walker with 5" wheels;Wheelchair (measurements PT);Wheelchair cushion (measurements PT)(16x16 w/c)    Recommendations for Other Services       Precautions / Restrictions Precautions Precautions: Fall Required Braces or Orthoses: Other Brace Other Brace: post op shoe Restrictions Weight Bearing Restrictions: Yes RLE Weight Bearing: Touchdown weight bearing RLE Partial Weight Bearing Percentage or Pounds: minimize heel pressure    Mobility  Bed Mobility               General bed mobility comments: Pt up in recliner  Transfers Overall transfer level: Needs assistance Equipment used: Rolling walker (2 wheeled);Ambulation equipment used Transfers: Sit to/from Stand Sit to Stand: Min guard         General transfer comment: min guard A for safety  Ambulation/Gait Ambulation/Gait assistance: Min guard Gait Distance (Feet): 75 Feet Assistive device: Rolling walker (2 wheeled) Gait Pattern/deviations: Step-to pattern;Decreased step length - right;Decreased step length -  left;Decreased weight shift to right Gait velocity: decr   General Gait Details: cues for uright posture; good ability to maintain weight bearing precautions     Stairs Stairs: Yes Stairs assistance: Min assist Stair Management: No rails;Step to pattern;Backwards;With walker Number of Stairs: (2 steps X 2 trials) General stair comments: cues for sequencing and technique; assist to steady and stabilize RW    Wheelchair Mobility    Modified Rankin (Stroke Patients Only)       Balance Overall balance assessment: Needs assistance Sitting-balance support: No upper extremity supported;Feet supported Sitting balance-Leahy Scale: Good     Standing balance support: Bilateral upper extremity supported Standing balance-Leahy Scale: Poor                              Cognition Arousal/Alertness: Awake/alert Behavior During Therapy: WFL for tasks assessed/performed Overall Cognitive Status: Within Functional Limits for tasks assessed                                        Exercises      General Comments        Pertinent Vitals/Pain Pain Assessment: Faces Faces Pain Scale: Hurts a little bit Pain Location: R foot Pain Descriptors / Indicators: Operative site guarding Pain Intervention(s): Limited activity within patient's tolerance;Monitored during session;Repositioned    Home Living                      Prior Function  PT Goals (current goals can now be found in the care plan section) Progress towards PT goals: Progressing toward goals    Frequency    Min 3X/week      PT Plan Current plan remains appropriate    Co-evaluation              AM-PAC PT "6 Clicks" Mobility   Outcome Measure  Help needed turning from your back to your side while in a flat bed without using bedrails?: None Help needed moving from lying on your back to sitting on the side of a flat bed without using bedrails?: None Help needed  moving to and from a bed to a chair (including a wheelchair)?: A Little Help needed standing up from a chair using your arms (e.g., wheelchair or bedside chair)?: A Little Help needed to walk in hospital room?: A Little Help needed climbing 3-5 steps with a railing? : A Little 6 Click Score: 20    End of Session Equipment Utilized During Treatment: Gait belt Activity Tolerance: Patient tolerated treatment well Patient left: in chair;with call bell/phone within reach;with chair alarm set Nurse Communication: Mobility status PT Visit Diagnosis: Other abnormalities of gait and mobility (R26.89);Muscle weakness (generalized) (M62.81);Pain Pain - Right/Left: Right Pain - part of body: Ankle and joints of foot     Time: 8657-8469 PT Time Calculation (min) (ACUTE ONLY): 44 min  Charges:  $Gait Training: 38-52 mins                     Earney Navy, PTA Acute Rehabilitation Services Pager: [phone] Office: [phone]     Darliss Cheney 07/30/2019, 1:49 PM

## 2019-07-30 NOTE — Progress Notes (Signed)
PROGRESS NOTE    Michael [name]  MOL:[id number] DOB: November 08, 1950 DOA: 07/16/2019 PCP: [name], PA-C   Brief Narrative:  Michael [name] 68 y.o.malewith medical history significant ofhypertension, hyperlipidemia, diabetes mellitus, asthma, GERD, gout, depression, cocaine abuse, PE not on anticoagulants, HIV, CAD,s/po ofCABG,s/p ofstent placement, atrial fibrillation not on anticoagulants, CKD stage III, iron deficiency anemia, who presented with chest pain.  Per patient, he has been having chest pain intermittently for more than Lexani Corona year since he had his CABG surgery but his pain has worsened in the last 2 days.  This was located at substernal area.  Aggravated with cough and deep breaths.  Patient recently had long distant travel to [place] month ago.  No other symptoms.  He used cocaine day prior to admission.  Also had swelling and pain in right second toe.  Upon arrival to ED, pt was found to have troponin 102, WBC 6.4, negative COVID-19 test, renal function close to baseline, temperature normal, blood pressure 145/86, heart rate 72, oxygen saturation 99% on room air. Chest x-ray is negative. X-ray of right foot showed possible distal second toe osteomyelitis.  Patient was admitted under hospitalist service.  He was ruled out of MI with negative cardiac enzymes.  MRI of right foot confirmed right second toe osteomyelitis and possible abscess.  Patient was seen by Dr. [name] and underwent right second toe ray amputation on 07/21/2019.  Patient was evaluated by PT OT and they recommended SNF.  Case manager on board.  Assessment & Plan:   Principal Problem:   Chest pain Active Problems:   HIV (human immunodeficiency virus infection) ([place])   Hyperlipidemia with target LDL less than 100   3-vessel CAD   Severe recurrent major depressive disorder with psychotic features ([name])   Cocaine use disorder, severe, dependence ([place])   Gout   Essential hypertension    Type II diabetes mellitus with renal manifestations ([name])   GERD (gastroesophageal reflux disease)   S/P CABG (coronary artery bypass graft)   Chronic kidney disease, stage III (moderate)   Paroxysmal Fantasia Jinkins-fib ([name])   Toe pain, right-second   Osteomyelitis ([place])   Abscess or cellulitis of toe, right  Planned for d/c 10/23 with home health, but unfortunately he has not been accepted to any home health agency.  PT previously recommending SNF and noted pt would need maximal home health as facilities were refusing him.  Discussed with his brother [name] today and we'd planned for him to go home with home health today, but unfortunately he has not been accepted to any home health agency.  Today PT recommended home health.  At this point, I don't think he's safe for discharge without home health arranged as I expect this pt who was being recommended SNF only yesterday will continue to have significant needs at home.  Will continue to follow over the weekend and discuss plans with CM/SW/PT.  Osteomyelitis of right second toe: MRI confirms osteomyelitis throughout distal phalanx of second toe with some fluid collection surrounding distal phalanx, consistent with an abscess.  Blood cultures have remained negative. S/p right second toe ray amputation on 10/14.  Antibiotics stopped after 24 hours of amputation.  PT OT saw patient.  They recommend SNF.  Case manager working on that however this has been challenging due to his history of cocaine abuse.  Pt says he knows he can't manage on his own (10/22).  Will try to touch base with family if pt agreeable (pt agreeable to  me calling brother, [name] today [phone]).  Chest pain with history of CAD/three-vessel disease: Cardiac enzymes negative.  His chest pain is chronic since he had CABG.  He has point tenderness.  EKG with no changes.  No further work-up indicated.   HIV: CD4 count 270 on 09/16/2018.  Continue home medications.  Follow  patient.  Hyperlipidemia: Not on any medications.  Cocaine use disorder: Counseling regarding cessation provided.  Essential hypertension: Controlled.  Continue oral Cardizem.  Continue as needed hydralazine.  Type 2 diabetes mellitus: Blood sugar elevated again during day.  Continue 25 units lantus, add mealtime insulin.  Paroxysmal Vista Sawatzky. Fib: Patient's cha2ds2 vascular 2 score is 4 but he is not on any anticoagulation.  Reason unknown. ?  Noncompliance.  Question of lack of capacity on 10/20, but when I discussed this with him he appeared to have capacity and was able to teach back to me anticoagulation.  He may have limited medical literacy, but at this time, I believe he has capacity.  As above, will attempt to discuss with family.  Will hold off on anticoagulation at this point and try to look into hx Teruko Joswick bit more (EKG from presentation with sinus arrhythmia and telemetry showing sinus - needs outpatient follow up, consider event monitor).    Anemia of chronic disease: His baseline seems to be between 9 and 10.  Currently at baseline.  Watch daily.  CKD stage III: Back to his baseline of around 1.5.  Diarrhea: Resolved.  DVT prophylaxis: heparin  Code Status: full  Family Communication: none at bedside Disposition Plan: pending safe discharge plan   Consultants:   orthopedics  Procedures:   R second toe amputation on 10/14  Antimicrobials:  Anti-infectives (From admission, onward)   Start     Dose/Rate Route Frequency Ordered Stop   07/30/19 0000  emtricitabine-rilpivir-tenofovir AF (ODEFSEY) 200-25-25 MG TABS tablet     1 tablet Oral Daily with breakfast 07/30/19 1449 08/29/19 2359   07/21/19 0600  ceFAZolin (ANCEF) IVPB 2g/100 mL premix     2 g 200 mL/hr over 30 Minutes Intravenous On call to O.R. 07/20/19 1928 07/21/19 1115   07/17/19 1100  cefTRIAXone (ROCEPHIN) 2 g in sodium chloride 0.9 % 100 mL IVPB     2 g 200 mL/hr over 30 Minutes Intravenous Every 24 hours  07/17/19 1017 07/22/19 1306   07/17/19 1100  metroNIDAZOLE (FLAGYL) IVPB 500 mg     500 mg 100 mL/hr over 60 Minutes Intravenous Every 8 hours 07/17/19 1017 07/22/19 2344   07/17/19 0800  emtricitabine-rilpivir-tenofovir AF (ODEFSEY) 200-25-25 MG per tablet 1 tablet    Note to Pharmacy: For HIV infection     1 tablet Oral Daily with breakfast 07/16/19 2258       Subjective: Discussed possible d/c plan He's feeling ok today  Objective: Vitals:   07/29/19 1323 07/29/19 1426 07/29/19 2122 07/30/19 0647  BP:  (!) 144/74 (!) 134/95 115/70  Pulse:  82 83 68  Resp: 15 (!) 23 14 16   Temp:  97.8 F (36.6 C) 97.7 F (36.5 C) 98.5 F (36.9 C)  TempSrc:  Oral Oral Oral  SpO2:   98% 100%  Weight:    74.2 kg  Height:        Intake/Output Summary (Last 24 hours) at 07/30/2019 1718 Last data filed at 07/30/2019 1000 Gross per 24 hour  Intake 480 ml  Output 1050 ml  Net -570 ml   Filed Weights   07/28/19 0635 07/29/19  6378 07/30/19 0647  Weight: 73.4 kg 74.3 kg 74.2 kg    Examination:  General: No acute distress. Cardiovascular: Heart sounds show Yannet Rincon regular rate, and rhythm.  Lungs: Clear to auscultation bilaterally  Abdomen: Soft, nontender, nondistended Neurological: Alert and oriented 3. Moves all extremities 4. Cranial nerves II through XII grossly intact. Skin: Warm and dry. No rashes or lesions. Extremities: R foot with intact dressing.      Data Reviewed: I have personally reviewed following labs and imaging studies  CBC: Recent Labs  Lab 07/24/19 0411 07/25/19 0452 07/26/19 0639 07/27/19 0350 07/28/19 0400 07/29/19 0303 07/30/19 0318  WBC 5.2 10.3 6.1 5.0 4.1 4.7 3.7*  NEUTROABS 3.2 8.2* 4.4 2.8 2.2  --   --   HGB 9.4* 11.0* 8.9* 9.5* 9.1* 8.6* 8.3*  HCT 29.5* 34.3* 28.2* 30.8* 28.1* 27.5* 27.5*  MCV 80.6 80.1 80.3 82.1 80.1 80.6 83.1  PLT 227 225 243 194 225 263 588   Basic Metabolic Panel: Recent Labs  Lab 07/25/19 0452 07/26/19 0639  07/28/19 0937 07/29/19 0303 07/30/19 0318 07/30/19 1435  NA 134* 136 137 135 137  --   K 4.7 4.2 4.2 4.2 5.2* 4.9  CL 98 101 102 103 106  --   CO2 24 26 25 25  21*  --   GLUCOSE 208* 272* 182* 219* 167*  --   BUN 27* 32* 24* 26* 26*  --   CREATININE 1.38* 1.55* 1.45* 1.42* 1.50*  --   CALCIUM 9.5 8.9 9.0 8.5* 8.5*  --   MG  --   --   --  2.2 2.2  --    GFR: Estimated Creatinine Clearance: 45 mL/min (Gregor Dershem) (by C-G formula based on SCr of 1.5 mg/dL (H)). Liver Function Tests: Recent Labs  Lab 07/29/19 0303 07/30/19 0318  AST 18 29  ALT 24 20  ALKPHOS 47 43  BILITOT 0.3 0.1*  PROT 6.2* 5.9*  ALBUMIN 2.7* 2.7*   No results for input(s): LIPASE, AMYLASE in the last 168 hours. No results for input(s): AMMONIA in the last 168 hours. Coagulation Profile: No results for input(s): INR, PROTIME in the last 168 hours. Cardiac Enzymes: No results for input(s): CKTOTAL, CKMB, CKMBINDEX, TROPONINI in the last 168 hours. BNP (last 3 results) No results for input(s): PROBNP in the last 8760 hours. HbA1C: No results for input(s): HGBA1C in the last 72 hours. CBG: Recent Labs  Lab 07/29/19 1548 07/29/19 2120 07/30/19 0723 07/30/19 1120 07/30/19 1647  GLUCAP 165* 198* 122* 243* 186*   Lipid Profile: No results for input(s): CHOL, HDL, LDLCALC, TRIG, CHOLHDL, LDLDIRECT in the last 72 hours. Thyroid Function Tests: No results for input(s): TSH, T4TOTAL, FREET4, T3FREE, THYROIDAB in the last 72 hours. Anemia Panel: No results for input(s): VITAMINB12, FOLATE, FERRITIN, TIBC, IRON, RETICCTPCT in the last 72 hours. Sepsis Labs: No results for input(s): PROCALCITON, LATICACIDVEN in the last 168 hours.  No results found for this or any previous visit (from the past 240 hour(s)).       Radiology Studies: No results found.      Scheduled Meds: . allopurinol  300 mg Oral Daily  . aspirin  81 mg Oral Daily  . colchicine  0.6 mg Oral Daily  . diltiazem  120 mg Oral Daily  .  docusate sodium  100 mg Oral BID  . emtricitabine-rilpivir-tenofovir AF  1 tablet Oral Q breakfast  . famotidine  20 mg Oral Daily  . ferrous sulfate  325 mg Oral BID WC  .  folic acid  1 mg Oral Daily  . gabapentin  400 mg Oral BID  . heparin injection (subcutaneous)  5,000 Units Subcutaneous Q8H  . insulin aspart  0-5 Units Subcutaneous QHS  . insulin aspart  0-9 Units Subcutaneous TID WC  . insulin aspart  3 Units Subcutaneous TID WC  . insulin glargine  25 Units Subcutaneous Daily  . multivitamin  1 tablet Oral Daily  . nutrition supplement (JUVEN)  1 packet Oral BID BM  . Ensure Max Protein  11 oz Oral Daily  . risperiDONE  0.25 mg Oral BID  . sertraline  50 mg Oral Daily  . sodium chloride flush  3 mL Intravenous Once  . thiamine  100 mg Oral Daily   Continuous Infusions: . sodium chloride    . lactated ringers 10 mL/hr at 07/21/19 1021  . methocarbamol (ROBAXIN) IV       LOS: 13 days    Time spent: over 30 min    Fayrene Helper, MD [facility] Pager AMION  If 7PM-7AM, please contact night-coverage [email or web address] Password [email or web address] 07/30/2019, 5:18 PM

## 2019-07-30 NOTE — Care Management Important Message (Signed)
Important Message  Patient Details  Name: Michael [name] MRN: [id number] Date of Birth: 1951-02-13   Medicare Important Message Given:  Yes     Shelda Altes 07/30/2019, 11:31 AM

## 2019-07-31 LAB — CBC
HCT: 28 % — ABNORMAL LOW (ref 39.0–52.0)
Hemoglobin: 8.7 g/dL — ABNORMAL LOW (ref 13.0–17.0)
MCH: 25.7 pg — ABNORMAL LOW (ref 26.0–34.0)
MCHC: 31.1 g/dL (ref 30.0–36.0)
MCV: 82.6 fL (ref 80.0–100.0)
Platelets: 251 10*3/uL (ref 150–400)
RBC: 3.39 MIL/uL — ABNORMAL LOW (ref 4.22–5.81)
RDW: 14.6 % (ref 11.5–15.5)
WBC: 4.4 10*3/uL (ref 4.0–10.5)
nRBC: 0 % (ref 0.0–0.2)

## 2019-07-31 LAB — GLUCOSE, CAPILLARY
Glucose-Capillary: 138 mg/dL — ABNORMAL HIGH (ref 70–99)
Glucose-Capillary: 175 mg/dL — ABNORMAL HIGH (ref 70–99)
Glucose-Capillary: 120 mg/dL — ABNORMAL HIGH (ref 70–99)
Glucose-Capillary: 111 mg/dL — ABNORMAL HIGH (ref 70–99)

## 2019-07-31 LAB — COMPREHENSIVE METABOLIC PANEL
ALT: 22 U/L (ref 0–44)
AST: 16 U/L (ref 15–41)
Albumin: 2.9 g/dL — ABNORMAL LOW (ref 3.5–5.0)
Alkaline Phosphatase: 46 U/L (ref 38–126)
Anion gap: 8 (ref 5–15)
BUN: 26 mg/dL — ABNORMAL HIGH (ref 8–23)
CO2: 24 mmol/L (ref 22–32)
Calcium: 8.8 mg/dL — ABNORMAL LOW (ref 8.9–10.3)
Chloride: 105 mmol/L (ref 98–111)
Creatinine, Ser: 1.65 mg/dL — ABNORMAL HIGH (ref 0.61–1.24)
GFR calc Af Amer: 48 mL/min — ABNORMAL LOW (ref >60)
GFR calc non Af Amer: 42 mL/min — ABNORMAL LOW (ref >60)
Glucose, Bld: 206 mg/dL — ABNORMAL HIGH (ref 70–99)
Potassium: 4.6 mmol/L (ref 3.5–5.1)
Sodium: 137 mmol/L (ref 135–145)
Total Bilirubin: 0.3 mg/dL (ref 0.3–1.2)
Total Protein: 6.4 g/dL — ABNORMAL LOW (ref 6.5–8.1)

## 2019-07-31 LAB — MAGNESIUM: Magnesium: 2.2 mg/dL (ref 1.7–2.4)

## 2019-07-31 NOTE — Progress Notes (Signed)
PROGRESS NOTE    Khoa Opdahl  DJS:[id number] DOB: [birth year] DOA: 07/16/2019 PCP: [name], PA-C   Brief Narrative:  Michael [name] 68 y.o.malewith medical history significant ofhypertension, hyperlipidemia, diabetes mellitus, asthma, GERD, gout, depression, cocaine abuse, PE not on anticoagulants, HIV, CAD,s/po ofCABG,s/p ofstent placement, atrial fibrillation not on anticoagulants, CKD stage III, iron deficiency anemia, who presented with chest pain.  Per patient, he has been having chest pain intermittently for more than Michael [name] year since he had his CABG surgery but his pain has worsened in the last 2 days.  This was located at substernal area.  Aggravated with cough and deep breaths.  Patient recently had long distant travel to [place] [name] month ago.  No other symptoms.  He used cocaine day prior to admission.  Also had swelling and pain in right second toe.  Upon arrival to ED, pt was found to have troponin 102, WBC 6.4, negative COVID-19 test, renal function close to baseline, temperature normal, blood pressure 145/86, heart rate 72, oxygen saturation 99% on room air. Chest x-ray is negative. X-ray of right foot showed possible distal second toe osteomyelitis.  Patient was admitted under hospitalist service.  He was ruled out of MI with negative cardiac enzymes.  MRI of right foot confirmed right second toe osteomyelitis and possible abscess.  Patient was seen by Dr. [name] and underwent right second toe ray amputation on 07/21/2019.  Patient was evaluated by PT OT and they recommended SNF.  Case manager on board.  Assessment & Plan:   Principal Problem:   Chest pain Active Problems:   HIV (human immunodeficiency virus infection) ([place])   Hyperlipidemia with target LDL less than 100   3-vessel CAD   Severe recurrent major depressive disorder with psychotic features ([place])   Cocaine use disorder, severe, dependence ([place])   Gout   Essential hypertension  Type II diabetes mellitus with renal manifestations ([name])   GERD (gastroesophageal reflux disease)   S/P CABG (coronary artery bypass graft)   Chronic kidney disease, stage III (moderate)   Paroxysmal Gabriella Guile-fib ([name])   Toe pain, right-second   Osteomyelitis ([place])   Abscess or cellulitis of toe, right  Planned for d/c 10/23 with home health, but unfortunately he has not been accepted to any home health agency.  PT previously recommending SNF and noted pt would need maximal home health as facilities were refusing him.  Discussed with his brother Michael [name] 10/23and we'd planned for him to go home with home health today, but unfortunately he has not been accepted to any home health agency.  Today PT recommended home health.  At this point, I don't think he's safe for discharge without home health arranged as I expect this pt who was being recommended SNF only 10/22 will continue to have significant needs at home.  Will continue to follow over the weekend and discuss plans with CM/SW/PT.  Osteomyelitis of right second toe: MRI confirms osteomyelitis throughout distal phalanx of second toe with some fluid collection surrounding distal phalanx, consistent with an abscess.  Blood cultures have remained negative. S/p right second toe ray amputation on 10/14.  Antibiotics stopped after 24 hours of amputation.  PT OT saw patient.  They recommend SNF.  Case manager working on that however this has been challenging due to his history of cocaine abuse.  Pt says he knows he can't manage on his own (10/22).  Will try to touch base with family if pt agreeable (pt agreeable to me calling brother,  [place] [phone]).  Chest pain with history of CAD/three-vessel disease: Cardiac enzymes negative.  His chest pain is chronic since he had CABG.  He has point tenderness.  EKG with no changes.  No further work-up indicated.   HIV: CD4 count 270 on 09/16/2018.  Continue home medications.  Follow patient.  Hyperlipidemia: Not  on any medications.  Cocaine use disorder: Counseling regarding cessation provided.  Essential hypertension: Controlled.  Continue oral Cardizem.  Continue as needed hydralazine.  Type 2 diabetes mellitus: Blood sugar elevated again during day.  Continue 25 units lantus, add mealtime insulin.  Paroxysmal Michael [name]. Fib: Patient's cha2ds2 vascular 2 score is 4 but he is not on any anticoagulation.  Reason unknown. ?  Noncompliance.  Question of lack of capacity on 10/20, but when I discussed this with him he appeared to have capacity and was able to teach back to me anticoagulation.  He may have limited medical literacy, but at this time, I believe he has capacity.  As above, will attempt to discuss with family.  Will hold off on anticoagulation at this point and try to look into hx Michael [name] bit more (EKG from presentation with sinus arrhythmia and telemetry showing sinus - needs outpatient follow up, consider event monitor).    Anemia of chronic disease: His baseline seems to be between 9 and 10.  Currently at baseline.  Watch daily.  CKD stage III: Back to his baseline of around 1.5.  Diarrhea: Resolved.  DVT prophylaxis: heparin  Code Status: full  Family Communication: none at bedside Disposition Plan: pending safe discharge plan   Consultants:   orthopedics  Procedures:   R second toe amputation on 10/14  Antimicrobials:  Anti-infectives (From admission, onward)   Start     Dose/Rate Route Frequency Ordered Stop   07/30/19 0000  emtricitabine-rilpivir-tenofovir AF (ODEFSEY) 200-25-25 MG TABS tablet     1 tablet Oral Daily with breakfast 07/30/19 1449 08/29/19 2359   07/21/19 0600  ceFAZolin (ANCEF) IVPB 2g/100 mL premix     2 g 200 mL/hr over 30 Minutes Intravenous On call to O.R. 07/20/19 1928 07/21/19 1115   07/17/19 1100  cefTRIAXone (ROCEPHIN) 2 g in sodium chloride 0.9 % 100 mL IVPB     2 g 200 mL/hr over 30 Minutes Intravenous Every 24 hours 07/17/19 1017 07/22/19 1306    07/17/19 1100  metroNIDAZOLE (FLAGYL) IVPB 500 mg     500 mg 100 mL/hr over 60 Minutes Intravenous Every 8 hours 07/17/19 1017 07/22/19 2344   07/17/19 0800  emtricitabine-rilpivir-tenofovir AF (ODEFSEY) 200-25-25 MG per tablet 1 tablet    Note to Pharmacy: For HIV infection     1 tablet Oral Daily with breakfast 07/16/19 2258       Subjective: No complaints today  Objective: Vitals:   07/29/19 2122 07/30/19 0647 07/30/19 2036 07/31/19 0635  BP: (!) 134/95 115/70 121/82 117/75  Pulse: 83 68 82 73  Resp: 14 16 16 18   Temp: 97.7 F (36.5 C) 98.5 F (36.9 C) 98.1 F (36.7 C) 98.3 F (36.8 C)  TempSrc: Oral Oral    SpO2: 98% 100% 99% 99%  Weight:  74.2 kg  76.8 kg  Height:       No intake or output data in the 24 hours ending 07/31/19 1700 Filed Weights   07/29/19 0649 07/30/19 0647 07/31/19 0635  Weight: 74.3 kg 74.2 kg 76.8 kg    Examination:  General: No acute distress. Cardiovascular: Heart sounds show Suyash Amory regular rate, and  rhythm.  Lungs: Clear to auscultation bilaterally  Abdomen: Soft, nontender, nondistended Neurological: Alert and oriented 3. Moves all extremities 4. Cranial nerves II through XII grossly intact. Skin: Warm and dry. No rashes or lesions. Extremities: dressing intact to RLE   Data Reviewed: I have personally reviewed following labs and imaging studies  CBC: Recent Labs  Lab 07/25/19 0452 07/26/19 0639 07/27/19 0350 07/28/19 0400 07/29/19 0303 07/30/19 0318 07/31/19 0340  WBC 10.3 6.1 5.0 4.1 4.7 3.7* 4.4  NEUTROABS 8.2* 4.4 2.8 2.2  --   --   --   HGB 11.0* 8.9* 9.5* 9.1* 8.6* 8.3* 8.7*  HCT 34.3* 28.2* 30.8* 28.1* 27.5* 27.5* 28.0*  MCV 80.1 80.3 82.1 80.1 80.6 83.1 82.6  PLT 225 243 194 225 263 257 315   Basic Metabolic Panel: Recent Labs  Lab 07/26/19 0639 07/28/19 0937 07/29/19 0303 07/30/19 0318 07/30/19 1435 07/31/19 0340  NA 136 137 135 137  --  137  K 4.2 4.2 4.2 5.2* 4.9 4.6  CL 101 102 103 106  --  105  CO2 26 25  25  21*  --  24  GLUCOSE 272* 182* 219* 167*  --  206*  BUN 32* 24* 26* 26*  --  26*  CREATININE 1.55* 1.45* 1.42* 1.50*  --  1.65*  CALCIUM 8.9 9.0 8.5* 8.5*  --  8.8*  MG  --   --  2.2 2.2  --  2.2   GFR: Estimated Creatinine Clearance: 40.9 mL/min (Stacia Feazell) (by C-G formula based on SCr of 1.65 mg/dL (H)). Liver Function Tests: Recent Labs  Lab 07/29/19 0303 07/30/19 0318 07/31/19 0340  AST 18 29 16   ALT 24 20 22   ALKPHOS 47 43 46  BILITOT 0.3 0.1* 0.3  PROT 6.2* 5.9* 6.4*  ALBUMIN 2.7* 2.7* 2.9*   No results for input(s): LIPASE, AMYLASE in the last 168 hours. No results for input(s): AMMONIA in the last 168 hours. Coagulation Profile: No results for input(s): INR, PROTIME in the last 168 hours. Cardiac Enzymes: No results for input(s): CKTOTAL, CKMB, CKMBINDEX, TROPONINI in the last 168 hours. BNP (last 3 results) No results for input(s): PROBNP in the last 8760 hours. HbA1C: No results for input(s): HGBA1C in the last 72 hours. CBG: Recent Labs  Lab 07/30/19 1120 07/30/19 1647 07/30/19 2125 07/31/19 0751 07/31/19 1155  GLUCAP 243* 186* 209* 138* 175*   Lipid Profile: No results for input(s): CHOL, HDL, LDLCALC, TRIG, CHOLHDL, LDLDIRECT in the last 72 hours. Thyroid Function Tests: No results for input(s): TSH, T4TOTAL, FREET4, T3FREE, THYROIDAB in the last 72 hours. Anemia Panel: No results for input(s): VITAMINB12, FOLATE, FERRITIN, TIBC, IRON, RETICCTPCT in the last 72 hours. Sepsis Labs: No results for input(s): PROCALCITON, LATICACIDVEN in the last 168 hours.  No results found for this or any previous visit (from the past 240 hour(s)).       Radiology Studies: No results found.      Scheduled Meds: . allopurinol  300 mg Oral Daily  . aspirin  81 mg Oral Daily  . colchicine  0.6 mg Oral Daily  . diltiazem  120 mg Oral Daily  . docusate sodium  100 mg Oral BID  . emtricitabine-rilpivir-tenofovir AF  1 tablet Oral Q breakfast  . famotidine  20 mg  Oral Daily  . ferrous sulfate  325 mg Oral BID WC  . folic acid  1 mg Oral Daily  . gabapentin  400 mg Oral BID  . heparin injection (subcutaneous)  5,000  Units Subcutaneous Q8H  . insulin aspart  0-5 Units Subcutaneous QHS  . insulin aspart  0-9 Units Subcutaneous TID WC  . insulin aspart  3 Units Subcutaneous TID WC  . insulin glargine  25 Units Subcutaneous Daily  . multivitamin  1 tablet Oral Daily  . nutrition supplement (JUVEN)  1 packet Oral BID BM  . Ensure Max Protein  11 oz Oral Daily  . risperiDONE  0.25 mg Oral BID  . sertraline  50 mg Oral Daily  . sodium chloride flush  3 mL Intravenous Once  . thiamine  100 mg Oral Daily   Continuous Infusions: . sodium chloride    . lactated ringers 10 mL/hr at 07/21/19 1021  . methocarbamol (ROBAXIN) IV       LOS: 14 days    Time spent: over 30 min    Fayrene Helper, MD [facility] Pager AMION  If 7PM-7AM, please contact night-coverage [email or web address] Password [email or web address] 07/31/2019, 5:00 PM

## 2019-08-01 LAB — COMPREHENSIVE METABOLIC PANEL
ALT: 20 U/L (ref 0–44)
AST: 17 U/L (ref 15–41)
Albumin: 2.9 g/dL — ABNORMAL LOW (ref 3.5–5.0)
Alkaline Phosphatase: 46 U/L (ref 38–126)
Anion gap: 8 (ref 5–15)
BUN: 28 mg/dL — ABNORMAL HIGH (ref 8–23)
CO2: 28 mmol/L (ref 22–32)
Calcium: 9.3 mg/dL (ref 8.9–10.3)
Chloride: 101 mmol/L (ref 98–111)
Creatinine, Ser: 1.51 mg/dL — ABNORMAL HIGH (ref 0.61–1.24)
GFR calc Af Amer: 54 mL/min — ABNORMAL LOW (ref >60)
GFR calc non Af Amer: 46 mL/min — ABNORMAL LOW (ref >60)
Glucose, Bld: 143 mg/dL — ABNORMAL HIGH (ref 70–99)
Potassium: 5.3 mmol/L — ABNORMAL HIGH (ref 3.5–5.1)
Sodium: 137 mmol/L (ref 135–145)
Total Bilirubin: 0.4 mg/dL (ref 0.3–1.2)
Total Protein: 6.7 g/dL (ref 6.5–8.1)

## 2019-08-01 LAB — CBC
HCT: 29.2 % — ABNORMAL LOW (ref 39.0–52.0)
Hemoglobin: 9.4 g/dL — ABNORMAL LOW (ref 13.0–17.0)
MCH: 26 pg (ref 26.0–34.0)
MCHC: 32.2 g/dL (ref 30.0–36.0)
MCV: 80.9 fL (ref 80.0–100.0)
Platelets: 223 10*3/uL (ref 150–400)
RBC: 3.61 MIL/uL — ABNORMAL LOW (ref 4.22–5.81)
RDW: 14.6 % (ref 11.5–15.5)
WBC: 4.8 10*3/uL (ref 4.0–10.5)
nRBC: 0 % (ref 0.0–0.2)

## 2019-08-01 LAB — GLUCOSE, CAPILLARY
Glucose-Capillary: 132 mg/dL — ABNORMAL HIGH (ref 70–99)
Glucose-Capillary: 153 mg/dL — ABNORMAL HIGH (ref 70–99)
Glucose-Capillary: 183 mg/dL — ABNORMAL HIGH (ref 70–99)
Glucose-Capillary: 266 mg/dL — ABNORMAL HIGH (ref 70–99)

## 2019-08-01 LAB — MAGNESIUM: Magnesium: 2.2 mg/dL (ref 1.7–2.4)

## 2019-08-01 NOTE — Progress Notes (Signed)
Physical Therapy Treatment Patient Details Name: Michael [name] MRN: [id number] DOB: 1951/05/30 Today's Date: 08/01/2019    History of Present Illness Pt adm with chest pain. Found to have osteomyelitis and underwent rt 2nd toe amputation on 10/14. PMH - covid, htn, HIV, cabg, dm, ckd, PE, cocaine abuse    PT Comments    The pt continues to make steady progress with mobility. Continues to need up to min assist with mobility at times. Remains appropriate for HHPT. Acute PT to continue during pt's hospital stay.    Follow Up Recommendations  Home health PT;Supervision for mobility/OOB     Equipment Recommendations  Rolling walker with 5" wheels;Wheelchair (measurements PT);Wheelchair cushion (measurements PT)    Precautions / Restrictions Precautions Precautions: Fall Required Braces or Orthoses: Other Brace Other Brace: post op shoe Restrictions Weight Bearing Restrictions: Yes RLE Weight Bearing: Touchdown weight bearing RLE Partial Weight Bearing Percentage or Pounds: minimize heel pressure    Mobility  Bed Mobility               General bed mobility comments: pt seated at edge of bed on arrival  Transfers Overall transfer level: Needs assistance   Transfers: Sit to/from Stand Sit to Stand: Min assist         General transfer comment: min assist for low bed to RW with standing, min guard assist to sit to recliner after session  Ambulation/Gait Ambulation/Gait assistance: Min guard Gait Distance (Feet): 80 Feet Assistive device: Rolling walker (2 wheeled) Gait Pattern/deviations: Step-to pattern;Decreased step length - right;Decreased step length - left;Decreased weight shift to right Gait velocity: decr   General Gait Details: cues for posture, walker position as pt tends to get too close to front of it. Pt did well using UE's of off load right foot to maintain weight bearing precautions   Stairs         General stair comments: provided pt  handout for use of walker and wheelchiar on stairs. Pt reveiwed the stair handout, recalled the technique from practice at previous session and had no questions.       Cognition Arousal/Alertness: Awake/alert Behavior During Therapy: WFL for tasks assessed/performed Overall Cognitive Status: Within Functional Limits for tasks assessed                  Pertinent Vitals/Pain Pain Assessment: 0-10 Pain Score: 4  Pain Location: R foot Pain Descriptors / Indicators: Operative site guarding;Sore Pain Intervention(s): Limited activity within patient's tolerance;Monitored during session;Premedicated before session     PT Goals (current goals can now be found in the care plan section) Acute Rehab PT Goals Patient Stated Goal: rehab so he can get home PT Goal Formulation: With patient Time For Goal Achievement: 08/05/19 Potential to Achieve Goals: Fair Progress towards PT goals: Progressing toward goals    Frequency    Min 3X/week      PT Plan Current plan remains appropriate    AM-PAC PT "6 Clicks" Mobility   Outcome Measure  Help needed turning from your back to your side while in a flat bed without using bedrails?: None Help needed moving from lying on your back to sitting on the side of a flat bed without using bedrails?: None Help needed moving to and from a bed to a chair (including a wheelchair)?: A Little Help needed standing up from a chair using your arms (e.g., wheelchair or bedside chair)?: A Little Help needed to walk in hospital room?: A Little Help needed climbing 3-5 steps  with a railing? : A Little 6 Click Score: 20    End of Session Equipment Utilized During Treatment: Gait belt Activity Tolerance: Patient tolerated treatment well Patient left: in chair;with call bell/phone within reach Nurse Communication: Mobility status PT Visit Diagnosis: Other abnormalities of gait and mobility (R26.89);Muscle weakness (generalized) (M62.81);Pain Pain -  Right/Left: Right Pain - part of body: Ankle and joints of foot     Time: 3312-5087 PT Time Calculation (min) (ACUTE ONLY): 28 min  Charges:  $Gait Training: 23-37 mins                    Willow Ora, [place], CLT Acute [employer] Office- [phone] 08/01/19, 1:08 PM  Willow Ora 08/01/2019, 1:06 PM

## 2019-08-01 NOTE — Progress Notes (Signed)
PROGRESS NOTE    Michael [name]  MOL:[id number] DOB: Dec 03, 1950 DOA: 07/16/2019 PCP: Michael Demark, PA-C   Brief Narrative:  Michael [name] 68 y.o.malewith medical history significant ofhypertension, hyperlipidemia, diabetes mellitus, asthma, GERD, gout, depression, cocaine abuse, PE not on anticoagulants, HIV, CAD,s/po ofCABG,s/p ofstent placement, atrial fibrillation not on anticoagulants, CKD stage III, iron deficiency anemia, who presented with chest pain.  Per patient, he has been having chest pain intermittently for more than Michael [name] year since he had his CABG surgery but his pain has worsened in the last 2 days.  This was located at substernal area.  Aggravated with cough and deep breaths.  Patient recently had long distant travel to [place] Michael [name] month ago.  No other symptoms.  He used cocaine day prior to admission.  Also had swelling and pain in right second toe.  Upon arrival to ED, pt was found to have troponin 102, WBC 6.4, negative COVID-19 test, renal function close to baseline, temperature normal, blood pressure 145/86, heart rate 72, oxygen saturation 99% on room air. Chest x-ray is negative. X-ray of right foot showed possible distal second toe osteomyelitis.  Patient was admitted under hospitalist service.  He was ruled out of MI with negative cardiac enzymes.  MRI of right foot confirmed right second toe osteomyelitis and possible abscess.  Patient was seen by Dr. Sharol [name] and underwent right second toe ray amputation on 07/21/2019.  Patient was evaluated by PT OT and they recommended SNF.  Case manager on board.  Assessment & Plan:   Principal Problem:   Chest pain Active Problems:   HIV (human immunodeficiency virus infection) ([place])   Hyperlipidemia with target LDL less than 100   3-vessel CAD   Severe recurrent major depressive disorder with psychotic features ([place])   Cocaine use disorder, severe, dependence ([place])   Gout   Essential hypertension    Type II diabetes mellitus with renal manifestations ([name])   GERD (gastroesophageal reflux disease)   S/P CABG (coronary artery bypass graft)   Chronic kidney disease, stage III (moderate)   Paroxysmal Tempestt [name] ([name])   Toe pain, right-second   Osteomyelitis ([place])   Abscess or cellulitis of toe, right  Planned for d/c 10/23 with home health, but unfortunately he has not been accepted to any home health agency.  PT previously recommending SNF and noted pt would need maximal home health as facilities were refusing him.  Discussed with his brother Michael [name] 10/23 and we'd planned for him to go home with home health with Michael [name], but unfortunately he has not been accepted to any home health agency.  PT now recommending home health, though it seems like we won't be able to get this for him.  At this time, discharge is pending safe discharge plan (he's planning to stay with [name], but has been unable to contact him to plan for this - I also wasn't able to reach him).  D/c with outpatient therapy and wound care maybe our only option at this point, though will try to make sure he has safe place to go prior to discharge.  Osteomyelitis of right second toe: MRI confirms osteomyelitis throughout distal phalanx of second toe with some fluid collection surrounding distal phalanx, consistent with an abscess.  Blood cultures have remained negative. S/p right second toe ray amputation on 10/14.  Antibiotics stopped after 24 hours of amputation.  PT OT saw patient.  They recommend SNF.  Case manager working on that however this has been challenging due to  his history of cocaine abuse.  Pt says he knows he can't manage on his own (10/22).  Will try to touch base with family if pt agreeable (pt agreeable to me calling brother, Michael [name] [phone]).  Chest pain with history of CAD/three-vessel disease: Cardiac enzymes negative.  His chest pain is chronic since he had CABG.  He has point tenderness.  EKG with no changes.  No further  work-up indicated.   HIV: CD4 count 270 on 09/16/2018.  Continue home medications.  Follow patient.  Hyperlipidemia: Not on any medications.  Cocaine use disorder: Counseling regarding cessation provided.  Essential hypertension: Controlled.  Continue oral Cardizem.  Continue as needed hydralazine.  Type 2 diabetes mellitus: Blood sugar elevated again during day.  Continue 25 units lantus, add mealtime insulin.  Paroxysmal Michael [name]. Fib: Patient's cha2ds2 vascular 2 score is 4 but he is not on any anticoagulation.  Reason unknown. ?  Noncompliance.  Question of lack of capacity on 10/20, but when I discussed this with him he appeared to have capacity and was able to teach back to me anticoagulation.  He may have limited medical literacy, but at this time, I believe he has capacity.  As above, will attempt to discuss with family.  Will hold off on anticoagulation at this point and try to look into hx Michael [name] bit more (EKG from presentation with sinus arrhythmia and telemetry showing sinus - needs outpatient follow up, consider event monitor).    Anemia of chronic disease: His baseline seems to be between 9 and 10.  Currently at baseline.  Watch daily.  CKD stage III: Back to his baseline of around 1.5.  Hyperkalemia: mild, follow  Diarrhea: Resolved.  DVT prophylaxis: heparin  Code Status: full  Family Communication: none at bedside Disposition Plan: pending safe discharge plan   Consultants:   orthopedics  Procedures:   R second toe amputation on 10/14  Antimicrobials:  Anti-infectives (From admission, onward)   Start     Dose/Rate Route Frequency Ordered Stop   07/30/19 0000  emtricitabine-rilpivir-tenofovir AF (ODEFSEY) 200-25-25 MG TABS tablet     1 tablet Oral Daily with breakfast 07/30/19 1449 08/29/19 2359   07/21/19 0600  ceFAZolin (ANCEF) IVPB 2g/100 mL premix     2 g 200 mL/hr over 30 Minutes Intravenous On call to O.R. 07/20/19 1928 07/21/19 1115   07/17/19 1100   cefTRIAXone (ROCEPHIN) 2 g in sodium chloride 0.9 % 100 mL IVPB     2 g 200 mL/hr over 30 Minutes Intravenous Every 24 hours 07/17/19 1017 07/22/19 1306   07/17/19 1100  metroNIDAZOLE (FLAGYL) IVPB 500 mg     500 mg 100 mL/hr over 60 Minutes Intravenous Every 8 hours 07/17/19 1017 07/22/19 2344   07/17/19 0800  emtricitabine-rilpivir-tenofovir AF (ODEFSEY) 200-25-25 MG per tablet 1 tablet    Note to Pharmacy: For HIV infection     1 tablet Oral Daily with breakfast 07/16/19 2258       Subjective: No complaints He hasn't been able to reach his brother  Objective: Vitals:   07/31/19 0930 07/31/19 2108 08/01/19 0305 08/01/19 1447  BP: 120/78 (!) 143/94 119/75 123/78  Pulse: 85 79 84 73  Resp:  14 15 15   Temp:  98.6 F (37 C) 98.6 F (37 C) 98.3 F (36.8 C)  TempSrc:  Oral Oral Oral  SpO2:  100% 99% 98%  Weight:   75.4 kg   Height:        Intake/Output Summary (Last 24 hours)  at 08/01/2019 1459 Last data filed at 08/01/2019 1300 Gross per 24 hour  Intake 600 ml  Output 1320 ml  Net -720 ml   Filed Weights   07/30/19 0647 07/31/19 0635 08/01/19 0305  Weight: 74.2 kg 76.8 kg 75.4 kg    Examination:  General: No acute distress. Cardiovascular: Heart sounds show Mickie Badders regular rate, and rhythm. Lungs: Clear to auscultation bilaterally Abdomen: Soft, nontender, nondistended Neurological: Alert and oriented 3. Moves all extremities 4 . Cranial nerves II through XII grossly intact. Skin: Warm and dry. No rashes or lesions. Extremities: RLE with intact dressing  Data Reviewed: I have personally reviewed following labs and imaging studies  CBC: Recent Labs  Lab 07/26/19 0639 07/27/19 0350 07/28/19 0400 07/29/19 0303 07/30/19 0318 07/31/19 0340 08/01/19 0704  WBC 6.1 5.0 4.1 4.7 3.7* 4.4 4.8  NEUTROABS 4.4 2.8 2.2  --   --   --   --   HGB 8.9* 9.5* 9.1* 8.6* 8.3* 8.7* 9.4*  HCT 28.2* 30.8* 28.1* 27.5* 27.5* 28.0* 29.2*  MCV 80.3 82.1 80.1 80.6 83.1 82.6 80.9  PLT  243 194 225 263 257 251 546   Basic Metabolic Panel: Recent Labs  Lab 07/28/19 0937 07/29/19 0303 07/30/19 0318 07/30/19 1435 07/31/19 0340 08/01/19 0704  NA 137 135 137  --  137 137  K 4.2 4.2 5.2* 4.9 4.6 5.3*  CL 102 103 106  --  105 101  CO2 25 25 21*  --  24 28  GLUCOSE 182* 219* 167*  --  206* 143*  BUN 24* 26* 26*  --  26* 28*  CREATININE 1.45* 1.42* 1.50*  --  1.65* 1.51*  CALCIUM 9.0 8.5* 8.5*  --  8.8* 9.3  MG  --  2.2 2.2  --  2.2 2.2   GFR: Estimated Creatinine Clearance: 44.7 mL/min (Latiana Tomei) (by C-G formula based on SCr of 1.51 mg/dL (H)). Liver Function Tests: Recent Labs  Lab 07/29/19 0303 07/30/19 0318 07/31/19 0340 08/01/19 0704  AST 18 29 16 17   ALT 24 20 22 20   ALKPHOS 47 43 46 46  BILITOT 0.3 0.1* 0.3 0.4  PROT 6.2* 5.9* 6.4* 6.7  ALBUMIN 2.7* 2.7* 2.9* 2.9*   No results for input(s): LIPASE, AMYLASE in the last 168 hours. No results for input(s): AMMONIA in the last 168 hours. Coagulation Profile: No results for input(s): INR, PROTIME in the last 168 hours. Cardiac Enzymes: No results for input(s): CKTOTAL, CKMB, CKMBINDEX, TROPONINI in the last 168 hours. BNP (last 3 results) No results for input(s): PROBNP in the last 8760 hours. HbA1C: No results for input(s): HGBA1C in the last 72 hours. CBG: Recent Labs  Lab 07/31/19 1155 07/31/19 1742 07/31/19 2130 08/01/19 0746 08/01/19 1111  GLUCAP 175* 120* 111* 132* 153*   Lipid Profile: No results for input(s): CHOL, HDL, LDLCALC, TRIG, CHOLHDL, LDLDIRECT in the last 72 hours. Thyroid Function Tests: No results for input(s): TSH, T4TOTAL, FREET4, T3FREE, THYROIDAB in the last 72 hours. Anemia Panel: No results for input(s): VITAMINB12, FOLATE, FERRITIN, TIBC, IRON, RETICCTPCT in the last 72 hours. Sepsis Labs: No results for input(s): PROCALCITON, LATICACIDVEN in the last 168 hours.  No results found for this or any previous visit (from the past 240 hour(s)).       Radiology  Studies: No results found.      Scheduled Meds: . allopurinol  300 mg Oral Daily  . aspirin  81 mg Oral Daily  . colchicine  0.6 mg Oral Daily  .  diltiazem  120 mg Oral Daily  . docusate sodium  100 mg Oral BID  . emtricitabine-rilpivir-tenofovir AF  1 tablet Oral Q breakfast  . famotidine  20 mg Oral Daily  . ferrous sulfate  325 mg Oral BID WC  . folic acid  1 mg Oral Daily  . gabapentin  400 mg Oral BID  . heparin injection (subcutaneous)  5,000 Units Subcutaneous Q8H  . insulin aspart  0-5 Units Subcutaneous QHS  . insulin aspart  0-9 Units Subcutaneous TID WC  . insulin aspart  3 Units Subcutaneous TID WC  . insulin glargine  25 Units Subcutaneous Daily  . multivitamin  1 tablet Oral Daily  . nutrition supplement (JUVEN)  1 packet Oral BID BM  . Ensure Max Protein  11 oz Oral Daily  . risperiDONE  0.25 mg Oral BID  . sertraline  50 mg Oral Daily  . sodium chloride flush  3 mL Intravenous Once  . thiamine  100 mg Oral Daily   Continuous Infusions: . sodium chloride    . lactated ringers 10 mL/hr at 07/21/19 1021  . methocarbamol (ROBAXIN) IV       LOS: 15 days    Time spent: over 30 min    Fayrene Helper, MD [facility] Pager [email or web address]  If 7PM-7AM, please contact night-coverage [email or web address] Password [email or web address] 08/01/2019, 2:59 PM

## 2019-08-02 LAB — CBC
HCT: 29.9 % — ABNORMAL LOW (ref 39.0–52.0)
Hemoglobin: 9.4 g/dL — ABNORMAL LOW (ref 13.0–17.0)
MCH: 25.6 pg — ABNORMAL LOW (ref 26.0–34.0)
MCHC: 31.4 g/dL (ref 30.0–36.0)
MCV: 81.5 fL (ref 80.0–100.0)
Platelets: 231 10*3/uL (ref 150–400)
RBC: 3.67 MIL/uL — ABNORMAL LOW (ref 4.22–5.81)
RDW: 14.6 % (ref 11.5–15.5)
WBC: 4.7 10*3/uL (ref 4.0–10.5)
nRBC: 0 % (ref 0.0–0.2)

## 2019-08-02 LAB — COMPREHENSIVE METABOLIC PANEL
ALT: 18 U/L (ref 0–44)
AST: 15 U/L (ref 15–41)
Albumin: 2.9 g/dL — ABNORMAL LOW (ref 3.5–5.0)
Alkaline Phosphatase: 47 U/L (ref 38–126)
Anion gap: 8 (ref 5–15)
BUN: 38 mg/dL — ABNORMAL HIGH (ref 8–23)
CO2: 27 mmol/L (ref 22–32)
Calcium: 9.4 mg/dL (ref 8.9–10.3)
Chloride: 103 mmol/L (ref 98–111)
Creatinine, Ser: 1.61 mg/dL — ABNORMAL HIGH (ref 0.61–1.24)
GFR calc Af Amer: 50 mL/min — ABNORMAL LOW (ref >60)
GFR calc non Af Amer: 43 mL/min — ABNORMAL LOW (ref >60)
Glucose, Bld: 122 mg/dL — ABNORMAL HIGH (ref 70–99)
Potassium: 5.2 mmol/L — ABNORMAL HIGH (ref 3.5–5.1)
Sodium: 138 mmol/L (ref 135–145)
Total Bilirubin: 0.5 mg/dL (ref 0.3–1.2)
Total Protein: 6.6 g/dL (ref 6.5–8.1)

## 2019-08-02 LAB — GLUCOSE, CAPILLARY
Glucose-Capillary: 120 mg/dL — ABNORMAL HIGH (ref 70–99)
Glucose-Capillary: 148 mg/dL — ABNORMAL HIGH (ref 70–99)
Glucose-Capillary: 214 mg/dL — ABNORMAL HIGH (ref 70–99)
Glucose-Capillary: 219 mg/dL — ABNORMAL HIGH (ref 70–99)

## 2019-08-02 LAB — MAGNESIUM: Magnesium: 2.2 mg/dL (ref 1.7–2.4)

## 2019-08-02 NOTE — TOC Progression Note (Signed)
Transition of Care [facility]) - Progression Note    Patient Details  Name: Michael [name] MRN: [id number] Date of Birth: [birth year]  Transition of Care [facility]) CM/SW Contact  Graves-Bigelow, Ocie Cornfield, RN Phone Number: 08/02/2019, 12:49 PM  Clinical Narrative:   CM received call from Chief Medical Office [facility]- Dr Parke Simmers. She is looking into to seeing if any home health agencies can accept the patient. CM did ask the patient to reach out to his brother to make sure he has a home to transition to. CM had checked outpatient and the wound care center will need order from Dr. [name] before dressing changes can be done. CM will follow for additional transition of care needs.   Expected Discharge Plan: [place] Barriers to Discharge: No Barriers Identified(unable to set patient up with [facility] agency- pt will need outpatient PT- Ambulatory referral sent.)  Expected Discharge Plan and Services Expected Discharge Plan: [place] In-house Referral: NA Discharge Planning Services: CM Consult, Follow-up appt scheduled Post Acute Care Choice: [place] arrangements for the past 2 months: Hotel/Motel Expected Discharge Date: 07/30/19               DME Arranged: Wheelchair manual, Walker rolling DME Agency: [employer] Date DME Agency Contacted: 07/30/19 Time DME Agency Contacted: 1500 Representative spoke with at DME Agency: [place]: RN, Disease Management, PT, OT, Nurse's Aide  Social Determinants of Health ([place]) Interventions    Readmission Risk Interventions Readmission Risk Prevention Plan 07/19/2019  Transportation Screening Complete  Medication Review Press photographer) Complete  HRI or Home Care Consult Complete  Palliative Care Screening Not Applicable  Some recent data might be hidden

## 2019-08-02 NOTE — Care Management Important Message (Signed)
Important Message  Patient Details  Name: Michael [name] MRN: [id number] Date of Birth: December 18, 1950   Medicare Important Message Given:  Yes     Shelda Altes 08/02/2019, 1:59 PM

## 2019-08-02 NOTE — Progress Notes (Signed)
Occupational Therapy Treatment Patient Details Name: Michael [name] MRN: [id number] DOB: 1951/04/04 Today's Date: 08/02/2019    History of present illness Pt adm with chest pain. Found to have osteomyelitis and underwent rt 2nd toe amputation on 10/14. PMH - covid, htn, HIV, cabg, dm, ckd, PE, cocaine abuse   OT comments   Pt complete LB dressing EOB with supervision requiring cues for sequencing of task to don post- op shoe with increased time. Pt required tactile feedback of therapists foot underneath pts RLE during functional mobility to maintain WB status. Pt with difficulty maintaining WB status despite MAX efforts. Pt reports dizziness with functional mobility- BP checked 137/73 HR 85. Pt complete standing grooming at sink with supervision; cues throughout to maintain WB status. Continue to recommend SNF level therapies d/t pt poor awareness of precautions, overall decreased activity tolerance and history of drug abuse. Feel pt not safe to DC home with brother or to motel  without 24 hour supervision. If pt unable to DC to SNF pt would need MAX HH efforts and 24 hour assist. Will continue to follow acutely per POC.    Follow Up Recommendations  SNF;Other (comment)(If SNFs declining pt, will need max HH)    Equipment Recommendations  Other (comment)(tbd to next venue of care)    Recommendations for Other Services      Precautions / Restrictions Precautions Precautions: Fall Required Braces or Orthoses: Other Brace Other Brace: post op shoe Restrictions Weight Bearing Restrictions: Yes RLE Weight Bearing: Touchdown weight bearing RLE Partial Weight Bearing Percentage or Pounds: minimize heel pressure       Mobility Bed Mobility Overal bed mobility: Needs Assistance Bed Mobility: Supine to Sit     Supine to sit: Supervision     General bed mobility comments: supervision for safety from falt bed; cues for sequencing of task; increased time and  effort  Transfers Overall transfer level: Needs assistance Equipment used: Rolling walker (2 wheeled) Transfers: Sit to/from Stand Sit to Stand: Min assist         General transfer comment: MIN A from EOB; required assist to maintain WB status during transfer    Balance Overall balance assessment: Needs assistance Sitting-balance support: No upper extremity supported;Feet supported Sitting balance-Leahy Scale: Good Sitting balance - Comments: able to reach out of BOS for LB dressing   Standing balance support: Bilateral upper extremity supported Standing balance-Leahy Scale: Poor Standing balance comment: reliant on BUE support                           ADL either performed or assessed with clinical judgement   ADL Overall ADL's : Needs assistance/impaired     Grooming: Oral care;Standing;Min guard               Lower Body Dressing: Supervision/safety;Set up;Sitting/lateral leans;Cueing for sequencing Lower Body Dressing Details (indicate cue type and reason): cues for sequencing of task to don post op shoe at EOb; increased time and effort Toilet Transfer: Ambulation;RW;Minimal assistance;Cueing for sequencing Toilet Transfer Details (indicate cue type and reason): simulated; pt required therpist foot underneath RLE to assist with maintaining WB'ing precautions during functional mobility; cues for sequencing step-to pattern         Functional mobility during ADLs: Minimal assistance;Rolling walker;Cueing for sequencing General ADL Comments: pt requires increased time for ADL paticipation; requires assist to problem solve ADL tasks; supervision/ set- up UB ADL; MIN guard for  standing balance;supervision/ set- up LB ADL  at EOB, cues for sequencing of task; requires tactile cues to maintain WB status during functional mobility     Vision Patient Visual Report: No change from baseline     Perception     Praxis      Cognition Arousal/Alertness:  Awake/alert Behavior During Therapy: WFL for tasks assessed/performed Overall Cognitive Status: Impaired/Different from baseline Area of Impairment: Memory;Safety/judgement;Problem solving                     Memory: Decreased recall of precautions   Safety/Judgement: Decreased awareness of safety;Decreased awareness of deficits   Problem Solving: Slow processing;Decreased initiation;Difficulty sequencing;Requires verbal cues;Requires tactile cues General Comments: pt requires increased time to problem solve ADL tasks; requires tactile cues to maintain WB status; slow to initiate tasks        Exercises     Shoulder Instructions       General Comments      Pertinent Vitals/ Pain       Pain Assessment: Faces Faces Pain Scale: Hurts a little bit Pain Location: R foot Pain Descriptors / Indicators: Burning;Discomfort Pain Intervention(s): Monitored during session;Repositioned  Home Living                                          Prior Functioning/Environment              Frequency  Min 2X/week        Progress Toward Goals  OT Goals(current goals can now be found in the care plan section)  Progress towards OT goals: Progressing toward goals  Acute Rehab OT Goals Patient Stated Goal: rehab so he can get home OT Goal Formulation: With patient Time For Goal Achievement: 08/12/19 Potential to Achieve Goals: Good  Plan Discharge plan remains appropriate    Co-evaluation                 AM-PAC OT "6 Clicks" Daily Activity     Outcome Measure   Help from another person eating meals?: None Help from another person taking care of personal grooming?: A Little Help from another person toileting, which includes using toliet, bedpan, or urinal?: A Little Help from another person bathing (including washing, rinsing, drying)?: A Lot Help from another person to put on and taking off regular upper body clothing?: A Little Help from  another person to put on and taking off regular lower body clothing?: A Little 6 Click Score: 18    End of Session Equipment Utilized During Treatment: Gait belt;Rolling walker;Other (comment)(psot op shoe)  OT Visit Diagnosis: Unsteadiness on feet (R26.81);Muscle weakness (generalized) (M62.81);Pain Pain - Right/Left: Right Pain - part of body: Ankle and joints of foot   Activity Tolerance Patient tolerated treatment well   Patient Left in chair;with call bell/phone within reach;with chair alarm set   Nurse Communication          Time: 8242-3536 OT Time Calculation (min): 45 min  Charges: OT General Charges $OT Visit: 1 Visit OT Treatments $Self Care/Home Management : 38-52 mins  [place], [place] [phone] [place] 08/02/2019, 11:43 AM

## 2019-08-02 NOTE — Progress Notes (Signed)
Physical Therapy Treatment Patient Details Name: Michael [name] MRN: [id number] DOB: Jan 18, 1951 Today's Date: 08/02/2019    History of Present Illness Pt adm with chest pain. Found to have osteomyelitis and underwent rt 2nd toe amputation on 10/14. PMH - covid, htn, HIV, cabg, dm, ckd, PE, cocaine abuse    PT Comments    Pt continues to progress with mobility. Pt hopeful to go home with brother.   Follow Up Recommendations  Home health PT;Supervision for mobility/OOB     Equipment Recommendations  Rolling walker with 5" wheels;Wheelchair (measurements PT);Wheelchair cushion (measurements PT)(16x16 w/c)    Recommendations for Other Services       Precautions / Restrictions Precautions Precautions: Fall Required Braces or Orthoses: Other Brace Other Brace: post op shoe Restrictions Weight Bearing Restrictions: Yes RLE Weight Bearing: Touchdown weight bearing RLE Partial Weight Bearing Percentage or Pounds: minimize heel pressure    Mobility  Bed Mobility               General bed mobility comments: Pt up in recliner  Transfers Overall transfer level: Needs assistance Equipment used: Rolling walker (2 wheeled);Ambulation equipment used Transfers: Sit to/from Stand Sit to Stand: Min guard         General transfer comment: min guard A for safety  Ambulation/Gait Ambulation/Gait assistance: Min guard;Min assist Gait Distance (Feet): 100 Feet Assistive device: Rolling walker (2 wheeled) Gait Pattern/deviations: Step-to pattern;Decreased step length - right;Decreased step length - left;Decreased weight shift to right;Trunk flexed Gait velocity: decr Gait velocity interpretation: <1.31 ft/sec, indicative of household ambulator General Gait Details: Verbal cues to stand more erect. Verbal cues for gait sequence. Pt picking up rolling walker instead of pushing it. Pt reports dizziness with turns and required assist for balance.    Stairs              Wheelchair Mobility    Modified Rankin (Stroke Patients Only)       Balance Overall balance assessment: Needs assistance Sitting-balance support: No upper extremity supported;Feet supported Sitting balance-Leahy Scale: Good     Standing balance support: Bilateral upper extremity supported Standing balance-Leahy Scale: Poor Standing balance comment: walker and min guard for support                            Cognition Arousal/Alertness: Awake/alert Behavior During Therapy: WFL for tasks assessed/performed Overall Cognitive Status: Within Functional Limits for tasks assessed                                        Exercises      General Comments        Pertinent Vitals/Pain Pain Assessment: Faces Faces Pain Scale: Hurts little more Pain Location: R foot Pain Descriptors / Indicators: Burning Pain Intervention(s): Monitored during session;Limited activity within patient's tolerance    Home Living                      Prior Function            PT Goals (current goals can now be found in the care plan section) Progress towards PT goals: Progressing toward goals    Frequency    Min 3X/week      PT Plan Current plan remains appropriate    Co-evaluation  AM-PAC PT "6 Clicks" Mobility   Outcome Measure  Help needed turning from your back to your side while in a flat bed without using bedrails?: None Help needed moving from lying on your back to sitting on the side of a flat bed without using bedrails?: None Help needed moving to and from a bed to a chair (including a wheelchair)?: A Little Help needed standing up from a chair using your arms (e.g., wheelchair or bedside chair)?: A Little Help needed to walk in hospital room?: A Little Help needed climbing 3-5 steps with a railing? : A Little 6 Click Score: 20    End of Session   Activity Tolerance: Other (comment)(dizziness) Patient left: in  chair;with call bell/phone within reach;with chair alarm set Nurse Communication: Mobility status PT Visit Diagnosis: Other abnormalities of gait and mobility (R26.89);Muscle weakness (generalized) (M62.81);Pain Pain - Right/Left: Right Pain - part of body: Ankle and joints of foot     Time: 6073-7106 PT Time Calculation (min) (ACUTE ONLY): 17 min  Charges:  $Gait Training: 8-22 mins                     [facility] [place] [facility] Pager [phone] Office [place] 08/02/2019, 4:39 PM

## 2019-08-02 NOTE — Progress Notes (Signed)
PROGRESS NOTE    Michael [name]  FYB:[id number] DOB: 12/27/1950 DOA: 07/16/2019 PCP: Michael Demark, PA-C   Brief Narrative:  Michael [name] a 68 y.o.malewith medical history significant ofhypertension, hyperlipidemia, diabetes mellitus, asthma, GERD, gout, depression, cocaine abuse, PE not on anticoagulants, HIV, CAD,s/po ofCABG,s/p ofstent placement, atrial fibrillation not on anticoagulants, CKD stage III, iron deficiency anemia, who presented with chest pain.  Per patient, he has been having chest pain intermittently for more than a year since he had his CABG surgery but his pain has worsened in the last 2 days.  This was located at substernal area.  Aggravated with cough and deep breaths.  Patient recently had long distant travel to [place] a month ago.  No other symptoms.  He used cocaine day prior to admission.  Also had swelling and pain in right second toe.  Upon arrival to ED, pt was found to have troponin 102, WBC 6.4, negative COVID-19 test, renal function close to baseline, temperature normal, blood pressure 145/86, heart rate 72, oxygen saturation 99% on room air. Chest x-ray is negative. X-ray of right foot showed possible distal second toe osteomyelitis.  Patient was admitted under hospitalist service.  He was ruled out of MI with negative cardiac enzymes.  MRI of right foot confirmed right second toe osteomyelitis and possible abscess.  Patient was seen by Dr. [name] and underwent right second toe ray amputation on 07/21/2019.  Patient was evaluated by PT OT and they recommended SNF.  Case manager on board.  Assessment & Plan:   Principal Problem:   Chest pain Active Problems:   HIV (human immunodeficiency virus infection) ([place])   Hyperlipidemia with target LDL less than 100   3-vessel CAD   Severe recurrent major depressive disorder with psychotic features ([place])   Cocaine use disorder, severe, dependence ([place])   Gout   Essential hypertension    Type II diabetes mellitus with renal manifestations ([name])   GERD (gastroesophageal reflux disease)   S/P CABG (coronary artery bypass graft)   Chronic kidney disease, stage III (moderate)   Paroxysmal A-fib ([name])   Toe pain, right-second   Osteomyelitis ([place])   Abscess or cellulitis of toe, right  Planned for d/c 10/23 with home health, but unfortunately he has not been accepted to any home health agency.  PT previously recommending SNF and noted pt would need maximal home health as facilities were refusing him.  Discussed with his brother [name] 10/23 and we'd planned for him to go home with home health with [name], but unfortunately he has not been accepted to any home health agency.  PT now recommending home health, though it seems like we won't be able to get this for him.  At this time, discharge is pending safe discharge plan.  OT today continues to recommend SNF vs 24 hr assistance, which he does not have.    Osteomyelitis of right second toe: MRI confirms osteomyelitis throughout distal phalanx of second toe with some fluid collection surrounding distal phalanx, consistent with an abscess.  Blood cultures have remained negative. S/p right second toe ray amputation on 10/14.  Antibiotics stopped after 24 hours of amputation.  PT OT saw patient.  They recommend SNF.  Case manager working on that however this has been challenging due to his history of cocaine abuse.  Pt says he knows he can't manage on his own (10/22).  Will try to touch base with family if pt agreeable (pt agreeable to me calling brother, [name] [phone]).  Chest pain with history of CAD/three-vessel disease: Cardiac enzymes negative.  His chest pain is chronic since he had CABG.  He has point tenderness.  EKG with no changes.  No further work-up indicated.   HIV: CD4 count 270 on 09/16/2018.  Continue home medications.  Follow patient.  Hyperlipidemia: Not on any medications.  Cocaine use disorder: Counseling  regarding cessation provided.  Essential hypertension: Controlled.  Continue oral Cardizem.  Continue as needed hydralazine.  Type 2 diabetes mellitus: Blood sugar elevated again during day.  Continue 25 units lantus, add mealtime insulin.  Paroxysmal A. Fib: Patient's cha2ds2 vascular 2 score is 4 but he is not on any anticoagulation.  Reason unknown. ?  Noncompliance.  Question of lack of capacity on 10/20, but when I discussed this with him he appeared to have capacity and was able to teach back to me anticoagulation.  He may have limited medical literacy, but at this time, I believe he has capacity.  As above, will attempt to discuss with family.  Will hold off on anticoagulation at this point and try to look into hx a bit more (EKG from presentation with sinus arrhythmia and telemetry showing sinus - needs outpatient follow up, consider event monitor).    Anemia of chronic disease: His baseline seems to be between 9 and 10.  Currently at baseline.  Watch daily.  CKD stage III: Back to his baseline of around 1.5.  Hyperkalemia: mild, follow  Diarrhea: Resolved.  DVT prophylaxis: heparin  Code Status: full  Family Communication: none at bedside Disposition Plan: pending safe discharge plan   Consultants:   orthopedics  Procedures:   R second toe amputation on 10/14  Antimicrobials:  Anti-infectives (From admission, onward)   Start     Dose/Rate Route Frequency Ordered Stop   07/30/19 0000  emtricitabine-rilpivir-tenofovir AF (ODEFSEY) 200-25-25 MG TABS tablet     1 tablet Oral Daily with breakfast 07/30/19 1449 08/29/19 2359   07/21/19 0600  ceFAZolin (ANCEF) IVPB 2g/100 mL premix     2 g 200 mL/hr over 30 Minutes Intravenous On call to O.R. 07/20/19 1928 07/21/19 1115   07/17/19 1100  cefTRIAXone (ROCEPHIN) 2 g in sodium chloride 0.9 % 100 mL IVPB     2 g 200 mL/hr over 30 Minutes Intravenous Every 24 hours 07/17/19 1017 07/22/19 1306   07/17/19 1100  metroNIDAZOLE  (FLAGYL) IVPB 500 mg     500 mg 100 mL/hr over 60 Minutes Intravenous Every 8 hours 07/17/19 1017 07/22/19 2344   07/17/19 0800  emtricitabine-rilpivir-tenofovir AF (ODEFSEY) 200-25-25 MG per tablet 1 tablet    Note to Pharmacy: For HIV infection     1 tablet Oral Daily with breakfast 07/16/19 2258       Subjective: No complaints Trying to reach brother  Objective: Vitals:   08/01/19 1941 08/02/19 0459 08/02/19 1130 08/02/19 1416  BP: (!) 138/93 139/79 134/73 (!) 105/91  Pulse: 87 75 85 80  Resp: 17 18    Temp: 97.7 F (36.5 C) 98.3 F (36.8 C)  98.8 F (37.1 C)  TempSrc: Oral Oral  Oral  SpO2: 100% 99%  99%  Weight:  74.1 kg    Height:        Intake/Output Summary (Last 24 hours) at 08/02/2019 1718 Last data filed at 08/02/2019 1701 Gross per 24 hour  Intake 480 ml  Output 450 ml  Net 30 ml   Filed Weights   07/31/19 0635 08/01/19 0305 08/02/19 0459  Weight: 76.8 kg  75.4 kg 74.1 kg    Examination:  General: No acute distress. Cardiovascular: Heart sounds show a regular rate, and rhythm. Lungs: Clear to auscultation bilaterally  Abdomen: Soft, nontender, nondistended Neurological: Alert and oriented 3. Moves all extremities 4. Cranial nerves II through XII grossly intact. Skin: Warm and dry. No rashes or lesions. Extremities: RLE with 2nd toe amputation   Data Reviewed: I have personally reviewed following labs and imaging studies  CBC: Recent Labs  Lab 07/27/19 0350 07/28/19 0400 07/29/19 0303 07/30/19 0318 07/31/19 0340 08/01/19 0704 08/02/19 0834  WBC 5.0 4.1 4.7 3.7* 4.4 4.8 4.7  NEUTROABS 2.8 2.2  --   --   --   --   --   HGB 9.5* 9.1* 8.6* 8.3* 8.7* 9.4* 9.4*  HCT 30.8* 28.1* 27.5* 27.5* 28.0* 29.2* 29.9*  MCV 82.1 80.1 80.6 83.1 82.6 80.9 81.5  PLT 194 225 263 257 251 223 458   Basic Metabolic Panel: Recent Labs  Lab 07/29/19 0303 07/30/19 0318 07/30/19 1435 07/31/19 0340 08/01/19 0704 08/02/19 0834  NA 135 137  --  137 137 138   K 4.2 5.2* 4.9 4.6 5.3* 5.2*  CL 103 106  --  105 101 103  CO2 25 21*  --  24 28 27   GLUCOSE 219* 167*  --  206* 143* 122*  BUN 26* 26*  --  26* 28* 38*  CREATININE 1.42* 1.50*  --  1.65* 1.51* 1.61*  CALCIUM 8.5* 8.5*  --  8.8* 9.3 9.4  MG 2.2 2.2  --  2.2 2.2 2.2   GFR: Estimated Creatinine Clearance: 41.9 mL/min (A) (by C-G formula based on SCr of 1.61 mg/dL (H)). Liver Function Tests: Recent Labs  Lab 07/29/19 0303 07/30/19 0318 07/31/19 0340 08/01/19 0704 08/02/19 0834  AST 18 29 16 17 15   ALT 24 20 22 20 18   ALKPHOS 47 43 46 46 47  BILITOT 0.3 0.1* 0.3 0.4 0.5  PROT 6.2* 5.9* 6.4* 6.7 6.6  ALBUMIN 2.7* 2.7* 2.9* 2.9* 2.9*   No results for input(s): LIPASE, AMYLASE in the last 168 hours. No results for input(s): AMMONIA in the last 168 hours. Coagulation Profile: No results for input(s): INR, PROTIME in the last 168 hours. Cardiac Enzymes: No results for input(s): CKTOTAL, CKMB, CKMBINDEX, TROPONINI in the last 168 hours. BNP (last 3 results) No results for input(s): PROBNP in the last 8760 hours. HbA1C: No results for input(s): HGBA1C in the last 72 hours. CBG: Recent Labs  Lab 08/01/19 1634 08/01/19 2152 08/02/19 0823 08/02/19 1159 08/02/19 1637  GLUCAP 183* 266* 120* 148* 214*   Lipid Profile: No results for input(s): CHOL, HDL, LDLCALC, TRIG, CHOLHDL, LDLDIRECT in the last 72 hours. Thyroid Function Tests: No results for input(s): TSH, T4TOTAL, FREET4, T3FREE, THYROIDAB in the last 72 hours. Anemia Panel: No results for input(s): VITAMINB12, FOLATE, FERRITIN, TIBC, IRON, RETICCTPCT in the last 72 hours. Sepsis Labs: No results for input(s): PROCALCITON, LATICACIDVEN in the last 168 hours.  No results found for this or any previous visit (from the past 240 hour(s)).       Radiology Studies: No results found.      Scheduled Meds: . allopurinol  300 mg Oral Daily  . aspirin  81 mg Oral Daily  . colchicine  0.6 mg Oral Daily  . diltiazem   120 mg Oral Daily  . docusate sodium  100 mg Oral BID  . emtricitabine-rilpivir-tenofovir AF  1 tablet Oral Q breakfast  . famotidine  20  mg Oral Daily  . ferrous sulfate  325 mg Oral BID WC  . folic acid  1 mg Oral Daily  . gabapentin  400 mg Oral BID  . heparin injection (subcutaneous)  5,000 Units Subcutaneous Q8H  . insulin aspart  0-5 Units Subcutaneous QHS  . insulin aspart  0-9 Units Subcutaneous TID WC  . insulin aspart  3 Units Subcutaneous TID WC  . insulin glargine  25 Units Subcutaneous Daily  . multivitamin  1 tablet Oral Daily  . nutrition supplement (JUVEN)  1 packet Oral BID BM  . Ensure Max Protein  11 oz Oral Daily  . risperiDONE  0.25 mg Oral BID  . sertraline  50 mg Oral Daily  . sodium chloride flush  3 mL Intravenous Once  . thiamine  100 mg Oral Daily   Continuous Infusions: . sodium chloride    . lactated ringers 10 mL/hr at 07/21/19 1021  . methocarbamol (ROBAXIN) IV       LOS: 16 days    Time spent: over 30 min    Fayrene Helper, MD [facility] Pager AMION  If 7PM-7AM, please contact night-coverage [email or web address] Password [email or web address] 08/02/2019, 5:18 PM

## 2019-08-03 LAB — COMPREHENSIVE METABOLIC PANEL
ALT: 17 U/L (ref 0–44)
AST: 16 U/L (ref 15–41)
Albumin: 2.8 g/dL — ABNORMAL LOW (ref 3.5–5.0)
Alkaline Phosphatase: 48 U/L (ref 38–126)
Anion gap: 9 (ref 5–15)
BUN: 44 mg/dL — ABNORMAL HIGH (ref 8–23)
CO2: 25 mmol/L (ref 22–32)
Calcium: 9.2 mg/dL (ref 8.9–10.3)
Chloride: 102 mmol/L (ref 98–111)
Creatinine, Ser: 1.93 mg/dL — ABNORMAL HIGH (ref 0.61–1.24)
GFR calc Af Amer: 40 mL/min — ABNORMAL LOW (ref >60)
GFR calc non Af Amer: 35 mL/min — ABNORMAL LOW (ref >60)
Glucose, Bld: 150 mg/dL — ABNORMAL HIGH (ref 70–99)
Potassium: 5 mmol/L (ref 3.5–5.1)
Sodium: 136 mmol/L (ref 135–145)
Total Bilirubin: 0.3 mg/dL (ref 0.3–1.2)
Total Protein: 6.7 g/dL (ref 6.5–8.1)

## 2019-08-03 LAB — URINALYSIS, ROUTINE W REFLEX MICROSCOPIC
Bilirubin Urine: NEGATIVE
Glucose, UA: NEGATIVE mg/dL
Hgb urine dipstick: NEGATIVE
Ketones, ur: NEGATIVE mg/dL
Leukocytes,Ua: NEGATIVE
Nitrite: NEGATIVE
Protein, ur: NEGATIVE mg/dL
Specific Gravity, Urine: 1.01 (ref 1.005–1.030)
pH: 7 (ref 5.0–8.0)

## 2019-08-03 LAB — GLUCOSE, CAPILLARY
Glucose-Capillary: 133 mg/dL — ABNORMAL HIGH (ref 70–99)
Glucose-Capillary: 159 mg/dL — ABNORMAL HIGH (ref 70–99)
Glucose-Capillary: 173 mg/dL — ABNORMAL HIGH (ref 70–99)
Glucose-Capillary: 142 mg/dL — ABNORMAL HIGH (ref 70–99)

## 2019-08-03 LAB — CBC
HCT: 28.9 % — ABNORMAL LOW (ref 39.0–52.0)
Hemoglobin: 9 g/dL — ABNORMAL LOW (ref 13.0–17.0)
MCH: 25.4 pg — ABNORMAL LOW (ref 26.0–34.0)
MCHC: 31.1 g/dL (ref 30.0–36.0)
MCV: 81.4 fL (ref 80.0–100.0)
Platelets: 221 10*3/uL (ref 150–400)
RBC: 3.55 MIL/uL — ABNORMAL LOW (ref 4.22–5.81)
RDW: 14.6 % (ref 11.5–15.5)
WBC: 4.8 10*3/uL (ref 4.0–10.5)
nRBC: 0 % (ref 0.0–0.2)

## 2019-08-03 LAB — MAGNESIUM: Magnesium: 2.2 mg/dL (ref 1.7–2.4)

## 2019-08-03 MED ORDER — FAMOTIDINE 20 MG PO TABS
20.0000 mg | ORAL_TABLET | Freq: Every day | ORAL | Status: DC
  Administered 2019-08-03: 20 mg via ORAL
  Filled 2019-08-03: qty 1

## 2019-08-03 MED ORDER — LACTATED RINGERS IV SOLN
INTRAVENOUS | Status: DC
  Administered 2019-08-03 – 2019-08-04 (×2): via INTRAVENOUS

## 2019-08-03 NOTE — Progress Notes (Signed)
PROGRESS NOTE    [name]  UVO:[id number] DOB: Dec 26, 1950 DOA: 07/16/2019 PCP: [name], PA-C   Brief Narrative:  Michael [name] Michael [name] 69 y.o.malewith medical history significant ofhypertension, hyperlipidemia, diabetes mellitus, asthma, GERD, gout, depression, cocaine abuse, PE not on anticoagulants, HIV, CAD,s/po ofCABG,s/p ofstent placement, atrial fibrillation not on anticoagulants, CKD stage III, iron deficiency anemia, who presented with chest pain.  Per patient, he has been having chest pain intermittently for more than Michael [name] year since he had his CABG surgery but his pain has worsened in the last 2 days.  This was located at substernal area.  Aggravated with cough and deep breaths.  Patient recently had long distant travel to [place] Michael [name] month ago.  No other symptoms.  He used cocaine day prior to admission.  Also had swelling and pain in right second toe.  Upon arrival to ED, pt was found to have troponin 102, WBC 6.4, negative COVID-19 test, renal function close to baseline, temperature normal, blood pressure 145/86, heart rate 72, oxygen saturation 99% on room air. Chest x-ray is negative. X-ray of right foot showed possible distal second toe osteomyelitis.  Patient was admitted under hospitalist service.  He was ruled out of MI with negative cardiac enzymes.  MRI of right foot confirmed right second toe osteomyelitis and possible abscess.  Patient was seen by Dr. [name] and underwent right second toe ray amputation on 07/21/2019.  Patient was evaluated by PT OT and they recommended SNF.  Case manager on board.    Pt has not been accepted by SNF or any home health agency.  Currently without Michael [name] safe discharge plan.   Assessment & Plan:   Principal Problem:   Chest pain Active Problems:   HIV (human immunodeficiency virus infection) ([place])   Hyperlipidemia with target LDL less than 100   3-vessel CAD   Severe recurrent major depressive disorder with  psychotic features ([place])   Cocaine use disorder, severe, dependence ([place])   Gout   Essential hypertension   Type II diabetes mellitus with renal manifestations ([name])   GERD (gastroesophageal reflux disease)   S/P CABG (coronary artery bypass graft)   Chronic kidney disease, stage III (moderate)   Paroxysmal Jae Skeet-fib ([name])   Toe pain, right-second   Osteomyelitis ([place])   Abscess or cellulitis of toe, right  Planned for d/c 10/23 with home health, but unfortunately he has not been accepted to any home health agency.  PT previously recommending SNF and noted pt would need maximal home health as facilities were refusing him.  Discussed with his brother Michael [name] 10/23 and we'd planned for him to go home with home health with Michael [name], but unfortunately he has not been accepted to any home health agency.  PT now recommending home health, though it seems like we won't be able to get this for him.  At this time, discharge is pending safe discharge plan.  OT 10/26 continues to recommend SNF vs 24 hr assistance, which he does not have.    Osteomyelitis of right second toe: MRI confirms osteomyelitis throughout distal phalanx of second toe with some fluid collection surrounding distal phalanx, consistent with an abscess.  Blood cultures have remained negative. S/p right second toe ray amputation on 10/14.  Antibiotics stopped after 24 hours of amputation.  PT OT saw patient.  They recommend SNF.  Case manager working on that however this has been challenging due to his history of cocaine abuse.  Pt says he knows he can't manage on  his own (10/22).  Will try to touch base with family if pt agreeable (pt agreeable to me calling brother, Michael [name] [phone]).  Chest pain with history of CAD/three-vessel disease: Cardiac enzymes negative.  His chest pain is chronic since he had CABG.  He has point tenderness.  EKG with no changes.  No further work-up indicated.   HIV: CD4 count 270 on 09/16/2018.  Continue home  medications.  Follow patient.  Hyperlipidemia: Not on any medications.  Cocaine use disorder: Counseling regarding cessation provided.  Essential hypertension: Controlled.  Continue oral Cardizem.  Continue as needed hydralazine.  Type 2 diabetes mellitus: Blood sugar elevated again during day.  Continue 25 units lantus, add mealtime insulin.  Paroxysmal Michael [name]. Fib: Patient's cha2ds2 vascular 2 score is 4 but he is not on any anticoagulation.  Reason unknown. ?  Noncompliance.  Question of lack of capacity on 10/20, but when I discussed this with him he appeared to have capacity and was able to teach back to me anticoagulation.  He may have limited medical literacy, but at this time, I believe he has capacity.  Will hold off on anticoagulation at this point and try to look into hx Michael [name] bit more (EKG from presentation with sinus arrhythmia and telemetry showing sinus - needs outpatient follow up, consider event monitor).    Anemia of chronic disease: His baseline seems to be between 9 and 10.  Currently at baseline.  Watch daily.  AKI on CKD stage III: baseline ~1.5.  Creatine bumped to 1.93 today.  Follow UA.  Give IVF.  Follow.   Hyperkalemia: mild, follow  Diarrhea: Resolved.  DVT prophylaxis: heparin  Code Status: full  Family Communication: none at bedside Disposition Plan: pending safe discharge plan   Consultants:   orthopedics  Procedures:   R second toe amputation on 10/14  Antimicrobials:  Anti-infectives (From admission, onward)   Start     Dose/Rate Route Frequency Ordered Stop   07/30/19 0000  emtricitabine-rilpivir-tenofovir AF (ODEFSEY) 200-25-25 MG TABS tablet     1 tablet Oral Daily with breakfast 07/30/19 1449 08/29/19 2359   07/21/19 0600  ceFAZolin (ANCEF) IVPB 2g/100 mL premix     2 g 200 mL/hr over 30 Minutes Intravenous On call to O.R. 07/20/19 1928 07/21/19 1115   07/17/19 1100  cefTRIAXone (ROCEPHIN) 2 g in sodium chloride 0.9 % 100 mL IVPB     2  g 200 mL/hr over 30 Minutes Intravenous Every 24 hours 07/17/19 1017 07/22/19 1306   07/17/19 1100  metroNIDAZOLE (FLAGYL) IVPB 500 mg     500 mg 100 mL/hr over 60 Minutes Intravenous Every 8 hours 07/17/19 1017 07/22/19 2344   07/17/19 0800  emtricitabine-rilpivir-tenofovir AF (ODEFSEY) 200-25-25 MG per tablet 1 tablet    Note to Pharmacy: For HIV infection     1 tablet Oral Daily with breakfast 07/16/19 2258       Subjective: No complaints at this time.  Objective: Vitals:   08/03/19 0628 08/03/19 0756 08/03/19 0816 08/03/19 1420  BP:   110/78 120/62  Pulse: 78  80 86  Resp:  17 18   Temp:   98.2 F (36.8 C) 98.1 F (36.7 C)  TempSrc:   Oral Oral  SpO2:   97% 96%  Weight:      Height:        Intake/Output Summary (Last 24 hours) at 08/03/2019 1937 Last data filed at 08/03/2019 1622 Gross per 24 hour  Intake 940 ml  Output 1725 ml  Net -785 ml   Filed Weights   08/01/19 0305 08/02/19 0459 08/03/19 0544  Weight: 75.4 kg 74.1 kg 73.4 kg    Examination:  General: No acute distress. Cardiovascular: Heart sounds show Nikia Mangino regular rate, and rhythm.  Lungs: Clear to auscultation bilaterally  Abdomen: Soft, nontender, nondistended  Neurological: Alert and oriented 3. Moves all extremities 4 Cranial nerves II through XII grossly intact. Skin: Warm and dry. No rashes or lesions. Extremities: RLE with intact dressing    Data Reviewed: I have personally reviewed following labs and imaging studies  CBC: Recent Labs  Lab 07/28/19 0400  07/30/19 0318 07/31/19 0340 08/01/19 0704 08/02/19 0834 08/03/19 0400  WBC 4.1   < > 3.7* 4.4 4.8 4.7 4.8  NEUTROABS 2.2  --   --   --   --   --   --   HGB 9.1*   < > 8.3* 8.7* 9.4* 9.4* 9.0*  HCT 28.1*   < > 27.5* 28.0* 29.2* 29.9* 28.9*  MCV 80.1   < > 83.1 82.6 80.9 81.5 81.4  PLT 225   < > 257 251 223 231 221   < > = values in this interval not displayed.   Basic Metabolic Panel: Recent Labs  Lab 07/30/19 0318  07/30/19 1435 07/31/19 0340 08/01/19 0704 08/02/19 0834 08/03/19 0400  NA 137  --  137 137 138 136  K 5.2* 4.9 4.6 5.3* 5.2* 5.0  CL 106  --  105 101 103 102  CO2 21*  --  24 28 27 25   GLUCOSE 167*  --  206* 143* 122* 150*  BUN 26*  --  26* 28* 38* 44*  CREATININE 1.50*  --  1.65* 1.51* 1.61* 1.93*  CALCIUM 8.5*  --  8.8* 9.3 9.4 9.2  MG 2.2  --  2.2 2.2 2.2 2.2   GFR: Estimated Creatinine Clearance: 34.9 mL/min (Josphine Laffey) (by C-G formula based on SCr of 1.93 mg/dL (H)). Liver Function Tests: Recent Labs  Lab 07/30/19 0318 07/31/19 0340 08/01/19 0704 08/02/19 0834 08/03/19 0400  AST 29 16 17 15 16   ALT 20 22 20 18 17   ALKPHOS 43 46 46 47 48  BILITOT 0.1* 0.3 0.4 0.5 0.3  PROT 5.9* 6.4* 6.7 6.6 6.7  ALBUMIN 2.7* 2.9* 2.9* 2.9* 2.8*   No results for input(s): LIPASE, AMYLASE in the last 168 hours. No results for input(s): AMMONIA in the last 168 hours. Coagulation Profile: No results for input(s): INR, PROTIME in the last 168 hours. Cardiac Enzymes: No results for input(s): CKTOTAL, CKMB, CKMBINDEX, TROPONINI in the last 168 hours. BNP (last 3 results) No results for input(s): PROBNP in the last 8760 hours. HbA1C: No results for input(s): HGBA1C in the last 72 hours. CBG: Recent Labs  Lab 08/02/19 1637 08/02/19 2059 08/03/19 0748 08/03/19 1131 08/03/19 1610  GLUCAP 214* 219* 133* 159* 173*   Lipid Profile: No results for input(s): CHOL, HDL, LDLCALC, TRIG, CHOLHDL, LDLDIRECT in the last 72 hours. Thyroid Function Tests: No results for input(s): TSH, T4TOTAL, FREET4, T3FREE, THYROIDAB in the last 72 hours. Anemia Panel: No results for input(s): VITAMINB12, FOLATE, FERRITIN, TIBC, IRON, RETICCTPCT in the last 72 hours. Sepsis Labs: No results for input(s): PROCALCITON, LATICACIDVEN in the last 168 hours.  No results found for this or any previous visit (from the past 240 hour(s)).       Radiology Studies: No results found.      Scheduled Meds: .  allopurinol  300 mg Oral Daily  .  aspirin  81 mg Oral Daily  . colchicine  0.6 mg Oral Daily  . diltiazem  120 mg Oral Daily  . docusate sodium  100 mg Oral BID  . emtricitabine-rilpivir-tenofovir AF  1 tablet Oral Q breakfast  . famotidine  20 mg Oral Daily  . ferrous sulfate  325 mg Oral BID WC  . folic acid  1 mg Oral Daily  . gabapentin  400 mg Oral BID  . heparin injection (subcutaneous)  5,000 Units Subcutaneous Q8H  . insulin aspart  0-5 Units Subcutaneous QHS  . insulin aspart  0-9 Units Subcutaneous TID WC  . insulin aspart  3 Units Subcutaneous TID WC  . insulin glargine  25 Units Subcutaneous Daily  . multivitamin  1 tablet Oral Daily  . nutrition supplement (JUVEN)  1 packet Oral BID BM  . Ensure Max Protein  11 oz Oral Daily  . risperiDONE  0.25 mg Oral BID  . sertraline  50 mg Oral Daily  . sodium chloride flush  3 mL Intravenous Once  . thiamine  100 mg Oral Daily   Continuous Infusions: . sodium chloride    . lactated ringers 10 mL/hr at 07/21/19 1021  . methocarbamol (ROBAXIN) IV       LOS: 17 days    Time spent: over 30 min    Fayrene Helper, MD [facility] Pager AMION  If 7PM-7AM, please contact night-coverage [email or web address] Password [facility] 08/03/2019, 7:37 PM

## 2019-08-03 NOTE — TOC Progression Note (Signed)
Transition of Care [facility]) - Progression Note    Patient Details  Name: Michael [name] MRN: [id number] Date of Birth: 10/15/1950  [facility]) CM/SW Contact  Graves-Bigelow, Ocie Cornfield, RN Phone Number: 08/03/2019, 2:25 PM  Clinical Narrative: Unable to find patient housing at this time. Not able to get any [facility] @ this time. Patient states he gets his check on Friday. He has called his brother and sister and unable to stay with them. CM did a [place] [facility] Referral for patient for permanent and temporary housing.      Expected Discharge Plan: [place] Barriers to Discharge: No Barriers Identified(unable to set patient up with [facility] agency- pt will need outpatient PT- Ambulatory referral sent.)  Expected Discharge Plan and Services Expected Discharge Plan: [place] In-house Referral: NA Discharge Planning Services: CM Consult, Follow-up appt scheduled Post Acute Care Choice: [place] arrangements for the past 2 months: Hotel/Motel Expected Discharge Date: 07/30/19               DME Arranged: Wheelchair manual, Walker rolling DME Agency: [employer] Date DME Agency Contacted: 07/30/19 Time DME Agency Contacted: 1500 Representative spoke with at DME Agency: [place]: RN, Disease Management, PT, OT, Nurse's Aide    Social Determinants of Health ([place]) Interventions    Readmission Risk Interventions Readmission Risk Prevention Plan 07/19/2019  Transportation Screening Complete  Medication Review Press photographer) Complete  HRI or Home Care Consult Complete  Palliative Care Screening Not Applicable  Some recent data might be hidden

## 2019-08-04 LAB — COMPREHENSIVE METABOLIC PANEL
ALT: 16 U/L (ref 0–44)
AST: 14 U/L — ABNORMAL LOW (ref 15–41)
Albumin: 2.9 g/dL — ABNORMAL LOW (ref 3.5–5.0)
Alkaline Phosphatase: 52 U/L (ref 38–126)
Anion gap: 8 (ref 5–15)
BUN: 43 mg/dL — ABNORMAL HIGH (ref 8–23)
CO2: 25 mmol/L (ref 22–32)
Calcium: 9 mg/dL (ref 8.9–10.3)
Chloride: 101 mmol/L (ref 98–111)
Creatinine, Ser: 1.82 mg/dL — ABNORMAL HIGH (ref 0.61–1.24)
GFR calc Af Amer: 43 mL/min — ABNORMAL LOW (ref >60)
GFR calc non Af Amer: 37 mL/min — ABNORMAL LOW (ref >60)
Glucose, Bld: 227 mg/dL — ABNORMAL HIGH (ref 70–99)
Potassium: 4.8 mmol/L (ref 3.5–5.1)
Sodium: 134 mmol/L — ABNORMAL LOW (ref 135–145)
Total Bilirubin: 0.2 mg/dL — ABNORMAL LOW (ref 0.3–1.2)
Total Protein: 6.5 g/dL (ref 6.5–8.1)

## 2019-08-04 LAB — GLUCOSE, CAPILLARY: Glucose-Capillary: 191 mg/dL — ABNORMAL HIGH (ref 70–99)

## 2019-08-04 LAB — CBC
HCT: 27.9 % — ABNORMAL LOW (ref 39.0–52.0)
Hemoglobin: 8.8 g/dL — ABNORMAL LOW (ref 13.0–17.0)
MCH: 25.7 pg — ABNORMAL LOW (ref 26.0–34.0)
MCHC: 31.5 g/dL (ref 30.0–36.0)
MCV: 81.6 fL (ref 80.0–100.0)
Platelets: 199 10*3/uL (ref 150–400)
RBC: 3.42 MIL/uL — ABNORMAL LOW (ref 4.22–5.81)
RDW: 14.8 % (ref 11.5–15.5)
WBC: 4.3 10*3/uL (ref 4.0–10.5)
nRBC: 0 % (ref 0.0–0.2)

## 2019-08-04 LAB — MAGNESIUM: Magnesium: 2.1 mg/dL (ref 1.7–2.4)

## 2019-08-04 NOTE — Progress Notes (Signed)
Pt educated & taught how to change the dressing on his foot. RN assisted & watched pt change the dressing. Supplies given to pt. Pt changed the dressing & also did a return demonstration afterwards showing that he understood. Hoover Brunette, RN

## 2019-08-04 NOTE — Discharge Summary (Signed)
Physician Discharge Summary  Michael [name]:[id number] DOB: May 23, 1951 DOA: 07/16/2019  PCP: Michael Demark, PA-C  Admit date: 07/16/2019 Discharge date: 08/04/2019  Discharged to: Home/[facility] Home Health: Unable to be obtained Discharge Condition: Stable  Recommendations for Outpatient Follow-up    1. Follow up with PCP in 1-2 weeks 2. Please follow up BMP/CBC 3. Ambulatory PT referral placed   Hospital Summary  Michael Treiber Andersonis a 68 y.o.malewith medical history significant ofhypertension, hyperlipidemia, diabetes mellitus, asthma, GERD, gout, depression, cocaine abuse, PE not on anticoagulants, HIV, CAD,s/po ofCABG,s/p ofstent placement, atrial fibrillation not on anticoagulants, CKD stage III, iron deficiency anemia, who presented with chest pain. Per patient, he has been having chest pain intermittently for more than a year since he had his CABG surgery but his pain has worsened in the last 2 days. This was located at substernal area. Aggravated with cough and deep breaths. Patient recently had long distant travel to [place] a month ago. No other symptoms. He used cocaine day prior to admission. Also had swelling and pain in right second toe.  Upon arrival to ED, pt was found to have troponin 102, WBC 6.4, negative COVID-19 test, renal function close to baseline, temperature normal, blood pressure 145/86, heart rate 72, oxygen saturation 99% on room air. Chest x-ray is negative. X-ray of right foot showed possible distal second toe osteomyelitis. Patient was admitted under hospitalist service. He was ruled out of MI with negative cardiac enzymes. MRI of right foot confirmed right second toe osteomyelitis and possible abscess. Patient was seen by Dr. [name] and underwent right second toe ray amputation on 07/21/2019. Patient was evaluated by PT OT and they recommended SNF. Case manager on board.     Planned for d/c 10/23 with home health, but  unfortunately he had not been accepted to any home health agency.  PT previously recommending SNF and noted pt would need maximal home health as facilities were refusing him.  Discussed with his brother Michael [name] 10/23 and we'd planned for him to go home with home health with Michael [name], but unfortunately he has not been accepted to any home health agency.  PT now recommending home health, though it seems like we won't be able to get this for him.  At this time, discharge is pending safe discharge plan.  OT 10/26 continues to recommend SNF vs 24 hr assistance, which he does not have.   Per CM 08/04/19: CM discussed with patient regarding Outpatient PT and patient feels like he has worked with therapy here to where he feels comfortable leaving without any Outpatient PT Services. CM did suggest to patient to contact PCP at [facility] if he feels like he needs therapy or Dr. Jess Barters office as well.Marland KitchenMarland KitchenCM did reach out to [facility] @ [phone] option 3 to see if she has any alternatives for housing. No home health or outpatient services set up this time. Patient has PCP appointment and surgical appointment scheduled. Patient states he gets his check on Friday. No further needs from CM at this time. His sister will pick him up today and take him to a motel.   A & P   Principal Problem:   Chest pain Active Problems:   HIV (human immunodeficiency virus infection) ([place])   Hyperlipidemia with target LDL less than 100   3-vessel CAD   Severe recurrent major depressive disorder with psychotic features ([place])   Cocaine use disorder, severe, dependence ([place])   Gout   Essential hypertension  Type II diabetes mellitus with renal manifestations ([name])   GERD (gastroesophageal reflux disease)   S/P CABG (coronary artery bypass graft)   Chronic kidney disease, stage III (moderate)   Paroxysmal A-fib ([name])   Toe pain, right-second   Osteomyelitis ([name])   Abscess or cellulitis of toe,  right    Code Status: Full Code Diet recommendation: Heart healthy  Consultants  . Orthopedic . Care  Procedures  underwent right second toe ray amputation on 07/21/2019    Subjective  Patient seen and examined at bedside no acute distress and resting comfortably.  No events overnight.  Tolerating diet. In good spirits and anticipating discharge.   Denies any chest pain, shortness of breath, fever, nausea, vomiting, urinary or bowel complaints. Otherwise ROS negative   Objective   Discharge Exam: Vitals:   08/04/19 0553 08/04/19 1012  BP:  125/82  Pulse:    Resp: 15   Temp:    SpO2:     Vitals:   08/03/19 2130 08/04/19 0551 08/04/19 0553 08/04/19 1012  BP: 117/78 124/81  125/82  Pulse: 89 80    Resp:  13 15   Temp: 98.3 F (36.8 C) 98.7 F (37.1 C)    TempSrc: Oral Oral    SpO2: 98% 99%    Weight:   73.9 kg   Height:        Physical Exam Vitals signs and nursing note reviewed.  Constitutional:      Appearance: Normal appearance.  HENT:     Head: Normocephalic and atraumatic.     Nose: Nose normal.     Mouth/Throat:     Mouth: Mucous membranes are moist.  Eyes:     Extraocular Movements: Extraocular movements intact.  Neck:     Musculoskeletal: Normal range of motion. No neck rigidity.  Cardiovascular:     Rate and Rhythm: Normal rate and regular rhythm.  Pulmonary:     Effort: Pulmonary effort is normal.     Breath sounds: Normal breath sounds.  Abdominal:     General: Abdomen is flat.     Palpations: Abdomen is soft.  Musculoskeletal: Normal range of motion.        General: No swelling.     Comments: Status post left second digit amputation with dressing in place  Neurological:     General: No focal deficit present.     Mental Status: He is alert. Mental status is at baseline.  Psychiatric:        Mood and Affect: Mood normal.        Behavior: Behavior normal.       The results of significant diagnostics from this hospitalization  (including imaging, microbiology, ancillary and laboratory) are listed below for reference.     Microbiology: No results found for this or any previous visit (from the past 240 hour(s)).   Labs: BNP (last 3 results) Recent Labs    04/18/19 0330 04/19/19 0310 07/17/19 0043  BNP 68.9 76.1 259.5*   Basic Metabolic Panel: Recent Labs  Lab 07/31/19 0340 08/01/19 0704 08/02/19 0834 08/03/19 0400 08/04/19 0433  NA 137 137 138 136 134*  K 4.6 5.3* 5.2* 5.0 4.8  CL 105 101 103 102 101  CO2 24 28 27 25 25   GLUCOSE 206* 143* 122* 150* 227*  BUN 26* 28* 38* 44* 43*  CREATININE 1.65* 1.51* 1.61* 1.93* 1.82*  CALCIUM 8.8* 9.3 9.4 9.2 9.0  MG 2.2 2.2 2.2 2.2 2.1   Liver Function Tests: Recent Labs  Lab 07/31/19 0340 08/01/19 0704 08/02/19 0834 08/03/19 0400 08/04/19 0433  AST 16 17 15 16  14*  ALT 22 20 18 17 16   ALKPHOS 46 46 47 48 52  BILITOT 0.3 0.4 0.5 0.3 0.2*  PROT 6.4* 6.7 6.6 6.7 6.5  ALBUMIN 2.9* 2.9* 2.9* 2.8* 2.9*   No results for input(s): LIPASE, AMYLASE in the last 168 hours. No results for input(s): AMMONIA in the last 168 hours. CBC: Recent Labs  Lab 07/31/19 0340 08/01/19 0704 08/02/19 0834 08/03/19 0400 08/04/19 0433  WBC 4.4 4.8 4.7 4.8 4.3  HGB 8.7* 9.4* 9.4* 9.0* 8.8*  HCT 28.0* 29.2* 29.9* 28.9* 27.9*  MCV 82.6 80.9 81.5 81.4 81.6  PLT 251 223 231 221 199   Cardiac Enzymes: No results for input(s): CKTOTAL, CKMB, CKMBINDEX, TROPONINI in the last 168 hours. BNP: Invalid input(s): POCBNP CBG: Recent Labs  Lab 08/03/19 0748 08/03/19 1131 08/03/19 1610 08/03/19 2131 08/04/19 0753  GLUCAP 133* 159* 173* 142* 191*   D-Dimer No results for input(s): DDIMER in the last 72 hours. Hgb A1c No results for input(s): HGBA1C in the last 72 hours. Lipid Profile No results for input(s): CHOL, HDL, LDLCALC, TRIG, CHOLHDL, LDLDIRECT in the last 72 hours. Thyroid function studies No results for input(s): TSH, T4TOTAL, T3FREE, THYROIDAB in the  last 72 hours.  Invalid input(s): FREET3 Anemia work up No results for input(s): VITAMINB12, FOLATE, FERRITIN, TIBC, IRON, RETICCTPCT in the last 72 hours. Urinalysis    Component Value Date/Time   COLORURINE STRAW (A) 08/03/2019 2206   APPEARANCEUR CLEAR 08/03/2019 2206   LABSPEC 1.010 08/03/2019 2206   PHURINE 7.0 08/03/2019 2206   GLUCOSEU NEGATIVE 08/03/2019 2206   HGBUR NEGATIVE 08/03/2019 2206   BILIRUBINUR NEGATIVE 08/03/2019 2206   KETONESUR NEGATIVE 08/03/2019 2206   PROTEINUR NEGATIVE 08/03/2019 2206   UROBILINOGEN 0.2 08/13/2015 1030   NITRITE NEGATIVE 08/03/2019 2206   LEUKOCYTESUR NEGATIVE 08/03/2019 2206   Sepsis Labs Invalid input(s): PROCALCITONIN,  WBC,  LACTICIDVEN Microbiology No results found for this or any previous visit (from the past 240 hour(s)).  Discharge Instructions     Discharge Instructions    Ambulatory referral to Physical Therapy   Complete by: As directed    Call MD for:  difficulty breathing, headache or visual disturbances   Complete by: As directed    Call MD for:  extreme fatigue   Complete by: As directed    Call MD for:  persistant dizziness or light-headedness   Complete by: As directed    Call MD for:  persistant nausea and vomiting   Complete by: As directed    Call MD for:  redness, tenderness, or signs of infection (pain, swelling, redness, odor or green/yellow discharge around incision site)   Complete by: As directed    Call MD for:  severe uncontrolled pain   Complete by: As directed    Call MD for:  temperature >100.4   Complete by: As directed    Diet - low sodium heart healthy   Complete by: As directed    Diet - low sodium heart healthy   Complete by: As directed    Discharge instructions   Complete by: As directed    You were seen for osteomyelitis (bone infection) of your right second toe.    This was treated surgically with amputation.    You should follow up with Dr. [name] next week.  Please call to  schedule an appointment.  Continue your odefsey as prescribed.  Also continue your diabetes medications as prescribed.  Please keep track of your blood sugars and bring a list to your PCP so they can adjust your insulin as necessary.    You have a history of atrial fibrillation, but we did not see this here in the hospital.  You should follow up with your outpatient provider and consider cardiac monitoring as an outpatient to determine if you have a fib.  They may recommend anticoagulation (a blood thinner) if this is found to be the case.  Return for new, recurrent, or worsening symptoms.  Please ask your PCP to request records from this hospitalization so they know what was done and what the next steps will be.   Increase activity slowly   Complete by: As directed    Increase activity slowly   Complete by: As directed      Allergies as of 08/04/2019   No Known Allergies     Medication List    STOP taking these medications   insulin glargine 100 UNIT/ML injection Commonly known as: LANTUS Replaced by: Insulin Glargine 100 UNIT/ML Solostar Pen     TAKE these medications   acetaminophen 325 MG tablet Commonly known as: TYLENOL Take 325 mg by mouth every 6 (six) hours as needed for mild pain.   albuterol 108 (90 Base) MCG/ACT inhaler Commonly known as: VENTOLIN HFA Inhale 1-2 puffs into the lungs every 6 (six) hours as needed for wheezing or shortness of breath.   allopurinol 300 MG tablet Commonly known as: ZYLOPRIM Take 1 tablet (300 mg total) by mouth daily. For gout Notes to patient: 08/05/19 around 1000 am   aspirin 81 MG chewable tablet Chew 1 tablet (81 mg total) by mouth daily. Heart health Notes to patient: 08/05/19 around 1000 am   colchicine 0.6 MG tablet Take 0.6 mg by mouth as needed. Notes to patient: 08/05/19 around 1000 am   diltiazem 120 MG 24 hr capsule Commonly known as: CARDIZEM CD Take 1 capsule (120 mg total) by mouth daily. For blood  pressure Notes to patient: 08/05/19 around 1000 am   emtricitabine-rilpivir-tenofovir AF 200-25-25 MG Tabs tablet Commonly known as: ODEFSEY Take 1 tablet by mouth daily with breakfast. For HIV infection Notes to patient: 08/05/19 around 0800 am   famotidine 20 MG tablet Commonly known as: PEPCID Take 1 tablet (20 mg total) by mouth 2 (two) times daily. For heart burn Notes to patient: Today 08/04/19 around 5000 pm then tomorrow 08/05/19 at 0800 am & 5 pm   ferrous sulfate 325 (65 FE) MG tablet Take 1 tablet (325 mg total) by mouth 2 (two) times daily with a meal. For low iron Notes to patient: Today 08/04/19 around 5000 pm then tomorrow 08/05/19 at 0800 am & 5 pm   fluticasone 44 MCG/ACT inhaler Commonly known as: FLOVENT HFA Inhale 1 puff into the lungs 2 (two) times daily. For shortness of breath   folic acid 1 MG tablet Commonly known as: FOLVITE Take 1 tablet (1 mg total) by mouth daily. For folate replacement Notes to patient: 08/05/19 around 1000 am   gabapentin 400 MG capsule Commonly known as: NEURONTIN Take 1 capsule (400 mg total) by mouth 2 (two) times daily. For agitation/neuropathy Notes to patient: Tonight 08/04/19 around 1000 pm then tomorrow 08/05/19 at 1000 am & 1000 pm   HumaLOG 100 UNIT/ML injection Generic drug: insulin lispro Inject 4-6 Units into the skin daily.   hydrOXYzine 25 MG tablet Commonly known as: ATARAX/VISTARIL Take 1 tablet (  25 mg total) by mouth 3 (three) times daily as needed for anxiety.   Insulin Glargine 100 UNIT/ML Solostar Pen Commonly known as: LANTUS Inject 20 Units into the skin daily. Replaces: insulin glargine 100 UNIT/ML injection Notes to patient: 08/05/19 around 1000 am   metFORMIN 1000 MG tablet Commonly known as: GLUCOPHAGE Take 1 tablet (1,000 mg total) by mouth daily. Notes to patient: 08/05/19 around 0800 am   nitroGLYCERIN 0.4 MG SL tablet Commonly known as: NITROSTAT Place 1 tablet (0.4 mg total) under the  tongue every 5 (five) minutes as needed for chest pain.   polyethylene glycol 17 g packet Commonly known as: MIRALAX / GLYCOLAX Take 17 g by mouth daily as needed for mild constipation.   risperiDONE 0.25 MG tablet Commonly known as: RISPERDAL Take 1 tablet (0.25 mg total) by mouth 2 (two) times daily. Notes to patient: Tonight around 1000 pm & then tomorrow 08/05/19 around 1000 am & 1000 pm    sertraline 50 MG tablet Commonly known as: ZOLOFT Take 1 tablet (50 mg total) by mouth daily. For depression Notes to patient: 08/05/19 around 1000 am   thiamine 100 MG tablet Take 1 tablet (100 mg total) by mouth daily. For thiamine deficiency Notes to patient: Tomorrow 08/05/19 around 1000 am   traZODone 100 MG tablet Commonly known as: DESYREL Take 1 tablet (100 mg total) by mouth at bedtime as needed for sleep.            Durable Medical Equipment  (From admission, onward)         Start     Ordered   07/30/19 1406  For home use only DME Walker rolling  [facility])  Once    Question:  Patient needs a walker to treat with the following condition  Answer:  Osteomyelitis ([place])   07/30/19 1408   07/30/19 1406  For home use only DME lightweight manual wheelchair with seat cushion  (Wheelchairs)  Once    Comments: Patient suffers from right second toe osteomyelitis s/p  which impairs their ability to perform daily activities like bathing, dressing and toileting in the home.  A walker will not resolve  issue with performing activities of daily living. A wheelchair will allow patient to safely perform daily activities. Patient is not able to propel themselves in the home using a standard weight wheelchair due to general weakness. Patient can self propel in the lightweight wheelchair. Length of need 6 months . Accessories: elevating leg rests (ELRs), wheel locks, extensions and anti-tippers.   07/30/19 1408   07/30/19 1405  For home use only DME wheelchair cushion (seat and back)   (Wheelchairs)  Once     07/30/19 1408         Follow-up Information    [name], MD Follow up in 1 week(s).   Specialty: Orthopedic Surgery Why: Please call for follow up appointment  Contact information: [place] [address] [phone]        [name], MD Follow up.   Specialty: Infectious Diseases Why: Please call for follow up appointment  Contact information: [place] [address] [phone]        [facility] Follow up.   Why: Conservation officer, nature, Wheelchair.  Contact information: [place] [address] [phone]        Dr. Meridee Score On 08/23/2019.   Why: @ 10:30 am for hospital follow up appointment. Please call the office  if you cannot make this scheduled appointment.  Contact information: [employer] [name] [place]  [phone] (schedule appointment)       Kerin Perna, NP Follow up on 08/23/2019.   Specialty: Internal Medicine Why: @ 10:30 am for hospital follow up appointment. If you cannot make this scheduled appointment- please call office to reschedule.  Contact information: [place] [address] [phone]        [place] Go to.   Why: this location for assistance with medications- Cost reanges from $4.00-$10.00 Contact information: [address] [place] [address] [phone]         No Known Allergies  Time coordinating discharge: Over 30 minutes   SIGNED:   Harold Hedge, D.O. [facility] 08/04/2019, 1:44 PM

## 2019-08-04 NOTE — Plan of Care (Signed)
  Problem: Education: Goal: Knowledge of General Education information will improve Description: Including pain rating scale, medication(s)/side effects and non-pharmacologic comfort measures Outcome: Completed/Met   Problem: Clinical Measurements: Goal: Ability to maintain clinical measurements within normal limits will improve Outcome: Completed/Met Goal: Will remain free from infection Outcome: Completed/Met Goal: Diagnostic test results will improve Outcome: Completed/Met Goal: Respiratory complications will improve Outcome: Completed/Met Goal: Cardiovascular complication will be avoided Outcome: Completed/Met   Problem: Activity: Goal: Risk for activity intolerance will decrease Outcome: Adequate for Discharge   Problem: Nutrition: Goal: Adequate nutrition will be maintained Outcome: Adequate for Discharge   Problem: Coping: Goal: Level of anxiety will decrease Outcome: Completed/Met   Problem: Elimination: Goal: Will not experience complications related to bowel motility Outcome: Completed/Met Goal: Will not experience complications related to urinary retention Outcome: Completed/Met   Problem: Pain Managment: Goal: General experience of comfort will improve Outcome: Adequate for Discharge   Problem: Safety: Goal: Ability to remain free from injury will improve Outcome: Adequate for Discharge   Problem: Skin Integrity: Goal: Risk for impaired skin integrity will decrease Outcome: Adequate for Discharge

## 2019-08-04 NOTE — Progress Notes (Signed)
Occupational Therapy Treatment Patient Details Name: Michael [name] MRN: [id number] DOB: August 06, 1951 Today's Date: 08/04/2019    History of present illness Pt adm with chest pain. Found to have osteomyelitis and underwent rt 2nd toe amputation on 10/14. PMH - covid, htn, HIV, cabg, dm, ckd, PE, cocaine abuse   OT comments  Pt progressing towards acute OT goals. Pt reports plan is d/c home today with sister. Pt overall setup to min guard with basic ADLs assessed today. Pt with 1 LOB resulting in uncontrolled descent to recliner after answering phone upon standing. Discussed fall prevention strategies and safety with mobility. D/c plan, though ultimately proven to not be achieveable, remains appropriate from OT standpoint.     Follow Up Recommendations  SNF(If SNFs declining pt, will need max HH)    Equipment Recommendations  3 in 1 bedside commode    Recommendations for Other Services      Precautions / Restrictions Precautions Precautions: Fall Required Braces or Orthoses: Other Brace Other Brace: post op shoe Restrictions Weight Bearing Restrictions: Yes RLE Weight Bearing: Touchdown weight bearing RLE Partial Weight Bearing Percentage or Pounds: minimize heel pressure       Mobility Bed Mobility               General bed mobility comments: Pt up in recliner  Transfers Overall transfer level: Needs assistance Equipment used: Rolling walker (2 wheeled) Transfers: Sit to/from Stand Sit to Stand: Min guard         General transfer comment: min guard A for safety. cues for technique with rw.    Balance Overall balance assessment: Needs assistance Sitting-balance support: No upper extremity supported;Feet supported Sitting balance-Leahy Scale: Good Sitting balance - Comments: able to reach out of BOS for LB dressing   Standing balance support: Bilateral upper extremity supported Standing balance-Leahy Scale: Poor Standing balance comment: walker and min  guard for support                           ADL either performed or assessed with clinical judgement   ADL Overall ADL's : Needs assistance/impaired     Grooming: Oral care;Standing;Min guard                               Functional mobility during ADLs: Min guard;Rolling walker General ADL Comments: Pt donned/doffed postop shoe, walked in room navigating obstacles, stood at sink for grooming task.       Vision       Perception     Praxis      Cognition Arousal/Alertness: Awake/alert Behavior During Therapy: WFL for tasks assessed/performed Overall Cognitive Status: No family/caregiver present to determine baseline cognitive functioning Area of Impairment: Problem solving;Memory;Safety/judgement                     Memory: Decreased recall of precautions   Safety/Judgement: Decreased awareness of safety;Decreased awareness of deficits   Problem Solving: Slow processing;Decreased initiation;Difficulty sequencing General Comments: increased time and cues to problem solve donning post op shoe.         Exercises     Shoulder Instructions       General Comments Pt with 1 LOB resulting in uncontrolled descent back into recliner after answering his cell phone in standing. "That hasn't happened to me before." Discussed fall prevention and safety with mobility.     Pertinent Vitals/ Pain  Pain Assessment: 0-10 Pain Score: 9 (9 standing, 7 sitting with RLE elevated) Pain Location: R foot Pain Descriptors / Indicators: Burning Pain Intervention(s): Monitored during session;Repositioned;Limited activity within patient's tolerance  Home Living                                          Prior Functioning/Environment              Frequency  Min 2X/week        Progress Toward Goals  OT Goals(current goals can now be found in the care plan section)  Progress towards OT goals: Progressing toward goals  Acute  Rehab OT Goals Patient Stated Goal: rehab so he can get home OT Goal Formulation: With patient Time For Goal Achievement: 08/12/19 Potential to Achieve Goals: Good ADL Goals Pt Will Perform Grooming: with min guard assist;standing Pt Will Perform Lower Body Bathing: with min assist Pt Will Perform Lower Body Dressing: with min assist Pt Will Transfer to Toilet: with supervision;ambulating Pt Will Perform Toileting - Clothing Manipulation and hygiene: with min guard assist;sit to/from stand  Plan Discharge plan remains appropriate    Co-evaluation                 AM-PAC OT "6 Clicks" Daily Activity     Outcome Measure   Help from another person eating meals?: None Help from another person taking care of personal grooming?: A Little Help from another person toileting, which includes using toliet, bedpan, or urinal?: A Little Help from another person bathing (including washing, rinsing, drying)?: A Little Help from another person to put on and taking off regular upper body clothing?: A Little Help from another person to put on and taking off regular lower body clothing?: A Little 6 Click Score: 19    End of Session Equipment Utilized During Treatment: Rolling walker;Gait belt  OT Visit Diagnosis: Unsteadiness on feet (R26.81);Muscle weakness (generalized) (M62.81);Pain Pain - Right/Left: Right Pain - part of body: Ankle and joints of foot   Activity Tolerance Patient tolerated treatment well   Patient Left in chair;with call bell/phone within reach;with chair alarm set   Nurse Communication          Time: 1103-1130 OT Time Calculation (min): 27 min  Charges: OT General Charges $OT Visit: 1 Visit OT Treatments $Self Care/Home Management : 23-37 mins  Tyrone Schimke, OT Acute Rehabilitation Services Pager: [phone] Office: [phone]    Hortencia Pilar 08/04/2019, 11:46 AM

## 2019-08-04 NOTE — TOC Transition Note (Addendum)
Transition of Care [facility]) - CM/SW Discharge Note   Patient Details  Name: Lowen Barringer MRN: [id number] Date of Birth: [birth year]  Transition of Care [facility]) CM/SW Contact:  Bethena Roys, RN Phone Number: 08/04/2019, 11:31 AM   Clinical Narrative: CM still unable to assist with [facility] Needs for patient-unable to have an agency accept the patient. CM did get verbal consent to send information to [name] for permanent/temporary housing- CM received many rejections and some still needs action. CM discussed with patient regarding Outpatient PT and patient feels like he has worked with therapy here to where he feels comfortable leaving without any Outpatient PT Services. CM did suggest to patient to contact PCP at the [facility] if he feels like he needs therapy or Dr. Jess Barters office as well. CM previously called the [facility] and was told that patient has to see MD Lezlie Lye Duda's office will need to make a referral to the [facility] for dressing changes. Patient has an appointment established with MD [name]. Staff RN will go over with patient regarding wound care in the hospital- Surgeons office should see next. Orders in house read as needed for dressing changes. Per patient his sister will pick him up today and take him to a motel. CM did reach out to [employer] [facility] @ [phone] option 3 to see if she has any alternatives for housing. No home health or outpatient services set up this time. Patient has PCP appointment and surgical appointment scheduled. Patient states he gets his check on Friday. No further needs from CM at this time.       1145 08-04-19 Substance Abuse Resources provided.   Final next level of care: Home/Self Care Barriers to Discharge: No Barriers Identified(unable to set patient up with [facility] agency- pt will need outpatient PT- Ambulatory referral sent.)   Patient Goals and CMS Choice Patient states their  goals for this hospitalization and ongoing recovery are:: "to regain strength" CMS Medicare.gov Compare Post Acute Care list provided to:: Patient Choice offered to / list presented to : Patient  Discharge Placement                       Discharge Plan and Services In-house Referral: NA Discharge Planning Services: CM Consult, Follow-up appt scheduled Post Acute Care Choice: Durable Medical Equipment(DME, RW and Wheelchair)          DME Arranged: Programmer, multimedia, Walker rolling DME Agency: [employer] Date DME Agency Contacted: 07/30/19 Time DME Agency Contacted: 1500 Representative spoke with at DME Agency: Thedore Mins HH Arranged: RN, Disease Management, PT, OT, Nurse's Aide [place] Agency: (unable to set up [facility].)        Social Determinants of Health ([place]) Interventions     Readmission Risk Interventions Readmission Risk Prevention Plan 07/19/2019  Transportation Screening Complete  Medication Review Press photographer) Complete  HRI or Home Care Consult Complete  Palliative Care Screening Not Applicable  Some recent data might be hidden

## 2019-08-05 ENCOUNTER — Telehealth: Payer: Self-pay

## 2019-08-05 ENCOUNTER — Telehealth: Payer: Self-pay | Admitting: Radiology

## 2019-08-05 ENCOUNTER — Other Ambulatory Visit: Payer: Self-pay | Admitting: *Deleted

## 2019-08-05 ENCOUNTER — Telehealth: Payer: Self-pay | Admitting: Orthopedic Surgery

## 2019-08-05 NOTE — Patient Outreach (Signed)
[place] [facility]) Care Management  08/05/2019  Tyton Abdallah 1951-05-17 [id number]   Referral received from care management assistant as member's sister called requesting assistance with managing his care.  Per referral notes, member was discharged yesterday after having a toe amputation.  He remains homeless but his sister has paid for him to be in a motel for the next 7 days.  Noted that sister state she is concerned for him for food, medication, and caring for his wound.  She is unable to care for him.  Per inpatient notes, member was not accepted into facility for rehab and was declined by home health agencies for follow up at discharge.  According to chart, he also has history of but not limited to hypertension, CAD, CHF, A-fib, GERD, diabetes, CKD, HIV, Cocaine abuse, hyperlipidemia, fibromyalgia, depression, and recently recovered from Covid-19.  Call placed to number provided by sister ([phone]), successful, identity verified.  This care manager introduced self and stated purpose of call.  [facility] care management services explained, he expresses gratitude for call stating "I don't know what to do."  He confirms he is living at the motel but does not have all of his medications, does not have a glucose meter for blood sugar checks, nor is he aware of what is needed for his wound care.  Report his sister came to visit him today, brought him a few groceries but will be out of town until next week.  He denies having any additional support, state he is hoping the food will last until next when questioned about resources for meals.  He is aware that he is unable to receive home health services but still feel he will need PT.  Noted per chart that member reported being comfortable leaving without PT services.  He state he does not remember saying that, now requesting PT.    Attempted to review medications with member, he repeatedly state "I don't know what I have, I just have a  bunch of bottles" while trying to review medications.  He is open to having [facility] pharmacist review medications with him.    Call was placed to Etter Sjogren, RNCM with primary MD office as per chart she reached out to member but was unsuccessful.  Message left requesting call back to collaborate on plan of care.  Call then placed to Dr. Jess Barters office, request made for follow up appointment as well as referral for outpatient PT.  Notified that order was already sent to [facility].  This care manager also inquired about instructions for wound care, message left for triage nurse, will await call back.  Referral placed to social worker for community resources and long term plan for housing, referral placed to pharmacist for medication management.  Will follow up with member within the next 2-3 business days upon calls back from triage nurse for wound care instructions.  Fall Risk  08/05/2019 04/22/2019 10/22/2017 04/17/2016 01/28/2016  Falls in the past year? 1 1 No No No  Number falls in past yr: 1 0 - - -  Injury with Fall? 0 0 - - -  Risk Factor Category  - - - - -  Risk for fall due to : History of fall(s);Impaired balance/gait - - Medication side effect -  Risk for fall due to: Comment - - - - -  Follow up Falls prevention discussed - - - -   THN CM Care Plan Problem One     Most Recent Value  Care Plan Problem One  Risk for readmission related to recent surgery as evidenced by no support and homelessness  Role Documenting the Problem One  Care Management [place] for Problem One  Active  [facility] Long Term Goal   Member will not be readmitted to hospital within the next 31 days  THN Long Term Goal Start Date  08/05/19  Interventions for Problem One Long Term Goal  Discharge instructions reviewed, advised of importance of following in effort to decrease risk of readmission.  Referral placed to social worker for community resources  [facility] CM Short Term Goal #1   Member will  complete follow up with ortho specialist within the next week  THN CM Short Term Goal #1 Start Date  08/05/19  Interventions for Short Term Goal #1  Call placed to Dr. [name] office to request follow up appointment and advice for wound care  [facility] CM Short Term Goal #2   Member will report taking medications as prescribed over the next 4 weeks  THN CM Short Term Goal #2 Start Date  08/05/19  Interventions for Short Term Goal #2  Attempted to review medications, referral placed to [place], RN, MSN [place] Manager [phone]

## 2019-08-05 NOTE — Telephone Encounter (Signed)
Transition Care Management Follow-up Telephone Call Date of discharge and from where:08/04/2019, [facility] hospital   Attempted to contact patient. Message left at # [phone]. Call back requested to this CM # [phone].  Call placed to # [phone] and it was the [facility]. They could not provide any guest information if indeed the patient is staying there.

## 2019-08-05 NOTE — Telephone Encounter (Signed)
Monica with [facility] left voicemail requesting instructions on wound care for patient. No home health agency would accept his care and he is staying at a hotel and needs to know what he should do for daily wound care. Please return Monica's call at [phone].

## 2019-08-05 NOTE — Telephone Encounter (Signed)
Message sent in error

## 2019-08-06 ENCOUNTER — Other Ambulatory Visit: Payer: Self-pay | Admitting: *Deleted

## 2019-08-06 ENCOUNTER — Telehealth: Payer: Self-pay | Admitting: Pharmacist

## 2019-08-06 ENCOUNTER — Telehealth: Payer: Self-pay | Admitting: *Deleted

## 2019-08-06 ENCOUNTER — Encounter: Payer: Self-pay | Admitting: *Deleted

## 2019-08-06 ENCOUNTER — Telehealth: Payer: Self-pay

## 2019-08-06 NOTE — Patient Outreach (Signed)
[facility]) Care Management  [place]   08/06/2019  Treydon Henricks 04-23-1951 [id number]  Reason for referral: Medication Review, Medication Reconciliation Post Discharge  Referral source: [facility] RN Current insurance: [facility]  PMHx includes but not limited to:   Michael Antis Andersonis a 68 y.o.malewith medical history significant ofhypertension, hyperlipidemia, diabetes mellitus, asthma, GERD, gout, depression, cocaine abuse, PE not on anticoagulants, HIV, CAD,s/po ofCABG,s/p ofstent placement, atrial fibrillation not on anticoagulants, CKD stage III and iron deficiency anemia.  Outreach:  Successful telephone call with patient .  HIPAA identifiers verified.   Subjective:  Patient reported that he is currently living in a hotel and was very confused about his medications.  Does the patient ever forget to take medication?  yes Does the patient have problems obtaining medications due to transportation?   yes Does the patient have problems obtaining medications due to cost?  no  Does the patient feel that medications prescribed are effective?  yes Does the patient ever experience any side effects to the medications prescribed?  no  Objective: The ASCVD Risk score Mikey Bussing DC Jr., et al., 2013) failed to calculate for the following reasons:   The patient has a prior MI or stroke diagnosis  Lab Results  Component Value Date   CREATININE 1.82 (H) 08/04/2019   CREATININE 1.93 (H) 08/03/2019   CREATININE 1.61 (H) 08/02/2019    Lab Results  Component Value Date   HGBA1C 9.7 (H) 07/17/2019    Lipid Panel     Component Value Date/Time   CHOL 178 07/17/2019 0043   CHOL 178 12/26/2016 1233   TRIG 68 07/17/2019 0043   HDL 54 07/17/2019 0043   HDL 48 12/26/2016 1233   CHOLHDL 3.3 07/17/2019 0043   VLDL 14 07/17/2019 0043   LDLCALC 110 (H) 07/17/2019 0043   LDLCALC 103 (H) 10/14/2017 1100    BP Readings from Last 3  Encounters:  08/04/19 125/82  06/19/19 126/88  06/15/19 (!) 141/94    No Known Allergies  Medications Reviewed Today    Reviewed by Candis Shine, CRNA (Certified Registered Nurse Anesthetist) on 07/21/19 at [place] List Status: Complete  Medication Order Taking? Sig Documenting Provider Last Dose Status Informant  acetaminophen (TYLENOL) 325 MG tablet [id number] Yes Take 325 mg by mouth every 6 (six) hours as needed for mild pain. [provider] 07/15/2019 Unknown time Active Self  albuterol (VENTOLIN HFA) 108 (90 Base) MCG/ACT inhaler [id number] Yes Inhale 1-2 puffs into the lungs every 6 (six) hours as needed for wheezing or shortness of breath. Mallie Darting, NP 07/15/2019 Unknown time Active Self  allopurinol (ZYLOPRIM) 300 MG tablet [id number] Yes Take 1 tablet (300 mg total) by mouth daily. For gout Mallie Darting, NP unknown Active Self  aspirin 81 MG chewable tablet [id number] Yes Chew 1 tablet (81 mg total) by mouth daily. Heart health Mallie Darting, NP 07/16/2019 Unknown time Active Self  colchicine 0.6 MG tablet [id number] Yes Take 0.6 mg by mouth as needed.  [provider] 07/15/2019 Unknown time Active Self  diltiazem (CARDIZEM CD) 120 MG 24 hr capsule [id number] Yes Take 1 capsule (120 mg total) by mouth daily. For blood pressure Merlyn Lot E, NP 07/15/2019 Unknown time Active Self  emtricitabine-rilpivir-tenofovir AF (ODEFSEY) 200-25-25 MG TABS tablet [id number] Yes Take 1 tablet by mouth daily with breakfast. For HIV infection Mallie Darting, NP 07/15/2019 Unknown time Active Self  famotidine (PEPCID) 20 MG tablet [id number]  Yes Take 1 tablet (20 mg total) by mouth 2 (two) times daily. For heart burn Mallie Darting, NP 07/15/2019 Unknown time Active Self  ferrous sulfate 325 (65 FE) MG tablet [id number] Yes Take 1 tablet (325 mg total) by mouth 2 (two) times daily with a meal. For low iron Merlyn Lot E, NP 07/15/2019 Unknown time Active Self  fluticasone  (FLOVENT HFA) 44 MCG/ACT inhaler [id number] Yes Inhale 1 puff into the lungs 2 (two) times daily. For shortness of breath Mallie Darting, NP 07/16/2019 Unknown time Active Self  folic acid (FOLVITE) 1 MG tablet [id number] Yes Take 1 tablet (1 mg total) by mouth daily. For folate replacement Mallie Darting, NP 07/16/2019 Unknown time Active Self  gabapentin (NEURONTIN) 400 MG capsule [id number] Yes Take 1 capsule (400 mg total) by mouth 2 (two) times daily. For agitation/neuropathy Mallie Darting, NP 07/15/2019 Unknown time Active Self  HUMALOG 100 UNIT/ML injection [id number] Yes Inject 4-6 Units into the skin daily. [provider] 07/15/2019 Unknown time Active Self  hydrOXYzine (ATARAX/VISTARIL) 25 MG tablet [id number] Yes Take 1 tablet (25 mg total) by mouth 3 (three) times daily as needed for anxiety. Mallie Darting, NP 07/15/2019 Unknown time Active Self  insulin glargine (LANTUS) 100 UNIT/ML injection [id number] Yes Inject 0.2 mLs (20 Units total) into the skin at bedtime. For high blood sugar Merlyn Lot E, NP 07/15/2019 Unknown time Active Self  metFORMIN (GLUCOPHAGE) 1000 MG tablet [id number] Yes Take 1,000 mg by mouth daily. [provider] 07/15/2019 Unknown time Active Self  nitroGLYCERIN (NITROSTAT) 0.4 MG SL tablet [id number] Yes Place 1 tablet (0.4 mg total) under the tongue every 5 (five) minutes as needed for chest pain. Merlyn Lot E, NP 07/16/2019 Unknown time Active Self  polyethylene glycol (MIRALAX / GLYCOLAX) 17 g packet [id number] Yes Take 17 g by mouth daily as needed for mild constipation. Mallie Darting, NP unknown Active Self  risperiDONE (RISPERDAL) 0.25 MG tablet [id number] Yes Take 0.25 mg by mouth 2 (two) times daily. [provider] 07/15/2019 Unknown time Active Self  sertraline (ZOLOFT) 50 MG tablet [id number] Yes Take 1 tablet (50 mg total) by mouth daily. For depression Mallie Darting, NP 07/15/2019 Unknown time Active Self  thiamine 100 MG tablet  [id number] Yes Take 1 tablet (100 mg total) by mouth daily. For thiamine deficiency Mallie Darting, NP 07/15/2019 Unknown time Active Self  traZODone (DESYREL) 100 MG tablet [id number] Yes Take 1 tablet (100 mg total) by mouth at bedtime as needed for sleep. Mallie Darting, NP 07/15/2019 Unknown time Active Self          Assessment: Drugs sorted by system:  Neurologic/Psychologic: Risperidone, Sertraline, Trazodone, Hydroxyzine  Pulmonary/Allergy Albuterol HF, Flovent HFA  Cardiovascular:  Aspirin, Hydralazine, Diltiazem,   Gastrointestinal: Famotidine, Polyethylene Glycol (needs)  Endocrine: Lantus, Humalog/Novolog (did not have), Metformin  Renal: Allopurinol  Infectious Diseases: Odefsey  Vitamins/Minerals/Supplements: Ferrous Sulfate  Pain Acetaminophen  Medication Review Findings:  Patient was very confused about his regimen.  He had prescriptions filled at three different pharmacies:  [facility], [facility], and [facility].  In addition to having medications having prescriptions filled at multiple pharmacies, he reported having multiple bottles/packages of the same medication.  [facility] apparently provided the patient's medications in pill packs.  Reviewed each medication bottle and package with the patient. Patient was asked to separate the duplicates so that he would not be confused.  Omitted Medications Gabapentin, Thiamine, Nitroglycerin- were all on the  patient's medication list but patient did not report having these medications.   Humalog was also on the patient's medication list but he said he did not have it. He said he remembered receiving it in the hospital but not since then. Patient's most recent HgA1c was 9.7%.  Medication Adherence Findings: Adherence Review  []  Excellent (no doses missed/week)     []  Good (no more than 1 dose missed/week)     []  Partial (2-3 doses missed/week) [x]  Poor (>3 doses missed/week)  Patient  with good understanding of regimen and fair understanding of indications.    Medication Assistance Findings:  No medication assistance needs identified  Extra Help:  Already receiving Full Extra Help Low Income Subsidy  Plan: . Will contact PCP regarding patient's medication regimen.  . Will route note to PCP.  Marland Kitchen Will follow-up in 5-7 business days.    Elayne Guerin, PharmD, [place] Clinical Pharmacist [phone]

## 2019-08-06 NOTE — Patient Outreach (Signed)
[name]) Care Management  08/06/2019  Michael [name] 23-Oct-1950 [id number]   CSW received referral from [place] that patient is homeless and suffers from cocaine abuse. Sister paid for motel for a week and bought a few groceries but will be out of town until next week, will need food resources. Does not have a good support system, was hoping to be placed at ALF. SNF for rehab was recommended, but facilities wouldn't accept him. Home health was also ordered, no agencies would accept. Will also need transportation for MD visits.  CSW made an initial attempt to try and contact patient today to perform phone assessment, as well as assess and assist with social needs and services, without success. A HIPPA compliant message was left for patient on voicemail (ph#: [phone]). CSW is currently awaiting a return call. CSW will make a second outreach attempt within the next 3-4 days, if CSW does not receive a return call from patient in the meantime.    Raynaldo Opitz, LCSW [facility]  Clinical Social Worker cell #: [phone]

## 2019-08-06 NOTE — Telephone Encounter (Signed)
Autumn can you please call, thank you

## 2019-08-06 NOTE — Telephone Encounter (Signed)
The pt is s/p a toe amputation 07/21/19. Advised that the pt can change dressing daily and that he has an appt on Tuesday and tht I can keep this message in my box and update her after visit. The pt is homeless and is residing in a motel that was paid for by his sister. He will only be there until the middle of next week and then will be homeless again. Given the pt's criminal history and addiction issues social work is having difficulty trying to find placement/ solutions for the pt. Will hold message and advise if there are any changes to the wound care orders so that supplies can be ordered for the pt as quickly as possible.

## 2019-08-06 NOTE — Telephone Encounter (Signed)
Transition Care Management Follow-up Telephone Call Attempt # 2 Date of discharge and from where: 08/04/2019, [facility]   Attempted to contact patient # [phone] left requesting a call back to this CM # [phone].  This CM spoke to [facility] [address],RN/THN who explained that she has spoken to patient since discharge. She has contacted Dr Jess Barters office and re-scheduled patient's appointment to 08/10/2019.  She is also waiting for a call back regarding wound care orders/ She said that the patient informed her that his dressing came off and he now just has a sock on the foot.  She is planning to have their SW reach out to patient to discuss long term plans. He is only at the current motel temporarily. The [facility] pharmacist has contacted patient about medication reconciliation.

## 2019-08-06 NOTE — Patient Outreach (Signed)
[place]) Care Management  08/06/2019  Chrisean Kloth [birth year] [id number]   Call received back from Etter Sjogren, RNCM with PCP office.  Discussed barriers to management of care as member will be homeless again once his paid time at the motel has ended.  She is aware that [facility] has RNCM, CSW, and Pharmacist all involved in his care.  THN will continue to provide resources, hoping to remain in contact with member after he is no longer at the motel.  Call received from [place], Dr. Jess Barters office, in regards to wound care.  Advised that member should apply dry dressing to wound daily, follow up scheduled for 11/3, will re-assess at that time for further instructions.  She is informed that this care manager is unsure if member has supplies for dressing changes.  Will follow up with member regarding supplies.  Advised if he does not have supplies, have member keep wound clean and covered.  Attempted to reach member to advised him of wound care instructions and follow up appointment on 11/3, no answer.  HIPAA compliant voice message left.  Will attempt to reach member again on Monday, 11/2.    Valente David, [place], MSN [place] [phone]

## 2019-08-09 ENCOUNTER — Other Ambulatory Visit: Payer: Self-pay | Admitting: *Deleted

## 2019-08-09 ENCOUNTER — Ambulatory Visit: Payer: Self-pay | Admitting: *Deleted

## 2019-08-09 ENCOUNTER — Telehealth: Payer: Self-pay | Admitting: *Deleted

## 2019-08-09 NOTE — Patient Outreach (Addendum)
[place] [facility]) Care Management  08/09/2019  Kelvin Sennett January 06, 1951 [id number]   CSW made a second attempt reach patient to follow-up on referral made by [place] for [facility] resources but patient did not answer. CSW left HIPPA compliant voicemail (ph#: [phone]). CSW will have unsuccessful outreach attempt letter mailed to address on file for patient & will make 3rd attempt to reach patient in 4 days. CSW will make a third outreach attempt in 10 days, if CSW does not receive a return call from patient in the meantime.      Raynaldo Opitz, LCSW [facility]  Clinical Social Worker cell #: [phone]

## 2019-08-09 NOTE — Patient Outreach (Signed)
[facility]) Care Management  08/09/2019  Michael [name] 11-21-1950 [id number]   Weekly transition of care call placed to member in effort to discuss follow up appointment and wound care.  No answer, HIPAA compliant voice message left.  Will follow up within the next 3-4 business days.  Valente David, [place], MSN [place] [phone]

## 2019-08-10 ENCOUNTER — Inpatient Hospital Stay: Payer: Medicare Other | Admitting: Family

## 2019-08-11 ENCOUNTER — Encounter (HOSPITAL_COMMUNITY): Payer: Self-pay | Admitting: Emergency Medicine

## 2019-08-11 ENCOUNTER — Other Ambulatory Visit: Payer: Self-pay | Admitting: *Deleted

## 2019-08-11 ENCOUNTER — Emergency Department (HOSPITAL_COMMUNITY)
Admission: EM | Admit: 2019-08-11 | Discharge: 2019-08-15 | Disposition: A | Discharge status: Home / Self Care | Payer: Medicare Other | Attending: Emergency Medicine | Admitting: Emergency Medicine

## 2019-08-11 ENCOUNTER — Emergency Department (HOSPITAL_COMMUNITY): Payer: Medicare Other

## 2019-08-11 ENCOUNTER — Other Ambulatory Visit: Payer: Self-pay

## 2019-08-11 DIAGNOSIS — E119 Type 2 diabetes mellitus without complications: Secondary | ICD-10-CM | POA: Insufficient documentation

## 2019-08-11 DIAGNOSIS — F1490 Cocaine use, unspecified, uncomplicated: Secondary | ICD-10-CM | POA: Insufficient documentation

## 2019-08-11 DIAGNOSIS — F329 Major depressive disorder, single episode, unspecified: Secondary | ICD-10-CM | POA: Insufficient documentation

## 2019-08-11 DIAGNOSIS — R072 Precordial pain: Secondary | ICD-10-CM | POA: Insufficient documentation

## 2019-08-11 DIAGNOSIS — B20 Human immunodeficiency virus [HIV] disease: Secondary | ICD-10-CM | POA: Insufficient documentation

## 2019-08-11 DIAGNOSIS — R45851 Suicidal ideations: Secondary | ICD-10-CM

## 2019-08-11 DIAGNOSIS — Z794 Long term (current) use of insulin: Secondary | ICD-10-CM | POA: Insufficient documentation

## 2019-08-11 DIAGNOSIS — Z86711 Personal history of pulmonary embolism: Secondary | ICD-10-CM | POA: Diagnosis not present

## 2019-08-11 DIAGNOSIS — Z7982 Long term (current) use of aspirin: Secondary | ICD-10-CM | POA: Insufficient documentation

## 2019-08-11 DIAGNOSIS — I2510 Atherosclerotic heart disease of native coronary artery without angina pectoris: Secondary | ICD-10-CM | POA: Insufficient documentation

## 2019-08-11 DIAGNOSIS — R45850 Homicidal ideations: Secondary | ICD-10-CM | POA: Diagnosis not present

## 2019-08-11 DIAGNOSIS — R0789 Other chest pain: Secondary | ICD-10-CM | POA: Diagnosis present

## 2019-08-11 DIAGNOSIS — R441 Visual hallucinations: Secondary | ICD-10-CM | POA: Insufficient documentation

## 2019-08-11 DIAGNOSIS — I4891 Unspecified atrial fibrillation: Secondary | ICD-10-CM | POA: Insufficient documentation

## 2019-08-11 DIAGNOSIS — Z20828 Contact with and (suspected) exposure to other viral communicable diseases: Secondary | ICD-10-CM | POA: Insufficient documentation

## 2019-08-11 DIAGNOSIS — I10 Essential (primary) hypertension: Secondary | ICD-10-CM | POA: Insufficient documentation

## 2019-08-11 DIAGNOSIS — G8929 Other chronic pain: Secondary | ICD-10-CM | POA: Insufficient documentation

## 2019-08-11 DIAGNOSIS — Z79899 Other long term (current) drug therapy: Secondary | ICD-10-CM | POA: Diagnosis not present

## 2019-08-11 LAB — URINALYSIS, ROUTINE W REFLEX MICROSCOPIC
Bilirubin Urine: NEGATIVE
Glucose, UA: NEGATIVE mg/dL
Ketones, ur: 20 mg/dL — AB
Leukocytes,Ua: NEGATIVE
Nitrite: NEGATIVE
Protein, ur: >=300 mg/dL — AB
Specific Gravity, Urine: 1.02 (ref 1.005–1.030)
pH: 5 (ref 5.0–8.0)

## 2019-08-11 LAB — BASIC METABOLIC PANEL
Anion gap: 12 (ref 5–15)
BUN: 23 mg/dL (ref 8–23)
CO2: 21 mmol/L — ABNORMAL LOW (ref 22–32)
Calcium: 9.3 mg/dL (ref 8.9–10.3)
Chloride: 105 mmol/L (ref 98–111)
Creatinine, Ser: 1.67 mg/dL — ABNORMAL HIGH (ref 0.61–1.24)
GFR calc Af Amer: 48 mL/min — ABNORMAL LOW (ref >60)
GFR calc non Af Amer: 41 mL/min — ABNORMAL LOW (ref >60)
Glucose, Bld: 164 mg/dL — ABNORMAL HIGH (ref 70–99)
Potassium: 3.7 mmol/L (ref 3.5–5.1)
Sodium: 138 mmol/L (ref 135–145)

## 2019-08-11 LAB — CBC
HCT: 31.3 % — ABNORMAL LOW (ref 39.0–52.0)
Hemoglobin: 9.8 g/dL — ABNORMAL LOW (ref 13.0–17.0)
MCH: 25.9 pg — ABNORMAL LOW (ref 26.0–34.0)
MCHC: 31.3 g/dL (ref 30.0–36.0)
MCV: 82.6 fL (ref 80.0–100.0)
Platelets: 208 10*3/uL (ref 150–400)
RBC: 3.79 MIL/uL — ABNORMAL LOW (ref 4.22–5.81)
RDW: 15.2 % (ref 11.5–15.5)
WBC: 3.2 10*3/uL — ABNORMAL LOW (ref 4.0–10.5)
nRBC: 0 % (ref 0.0–0.2)

## 2019-08-11 LAB — TROPONIN I (HIGH SENSITIVITY)
Troponin I (High Sensitivity): 6 ng/L (ref <18)
Troponin I (High Sensitivity): 6 ng/L (ref <18)
Troponin I (High Sensitivity): 4 ng/L (ref <18)

## 2019-08-11 LAB — SARS CORONAVIRUS 2 BY RT PCR (HOSPITAL ORDER, PERFORMED IN ~~LOC~~ HOSPITAL LAB): SARS Coronavirus 2: NEGATIVE

## 2019-08-11 LAB — HEPATIC FUNCTION PANEL
ALT: 17 U/L (ref 0–44)
AST: 18 U/L (ref 15–41)
Albumin: 3.6 g/dL (ref 3.5–5.0)
Alkaline Phosphatase: 52 U/L (ref 38–126)
Bilirubin, Direct: <0.1 mg/dL (ref 0.0–0.2)
Total Bilirubin: 0.8 mg/dL (ref 0.3–1.2)
Total Protein: 7.5 g/dL (ref 6.5–8.1)

## 2019-08-11 LAB — ETHANOL: Alcohol, Ethyl (B): <10 mg/dL (ref <10)

## 2019-08-11 LAB — RAPID URINE DRUG SCREEN, HOSP PERFORMED
Amphetamines: NOT DETECTED
Barbiturates: NOT DETECTED
Benzodiazepines: NOT DETECTED
Cocaine: POSITIVE — AB
Opiates: NOT DETECTED
Tetrahydrocannabinol: NOT DETECTED

## 2019-08-11 LAB — LIPASE, BLOOD: Lipase: 62 U/L — ABNORMAL HIGH (ref 11–51)

## 2019-08-11 LAB — CBG MONITORING, ED: Glucose-Capillary: 204 mg/dL — ABNORMAL HIGH (ref 70–99)

## 2019-08-11 LAB — D-DIMER, QUANTITATIVE: D-Dimer, Quant: 0.47 ug/mL-FEU (ref 0.00–0.50)

## 2019-08-11 MED ORDER — TRAZODONE HCL 100 MG PO TABS
100.0000 mg | ORAL_TABLET | Freq: Every evening | ORAL | Status: DC | PRN
  Administered 2019-08-12 – 2019-08-14 (×2): 100 mg via ORAL
  Filled 2019-08-11: qty 1
  Filled 2019-08-11: qty 2

## 2019-08-11 MED ORDER — ASPIRIN 81 MG PO CHEW
81.0000 mg | CHEWABLE_TABLET | Freq: Every day | ORAL | Status: DC
  Administered 2019-08-12 – 2019-08-14 (×3): 81 mg via ORAL
  Filled 2019-08-11 (×3): qty 1

## 2019-08-11 MED ORDER — METFORMIN HCL 500 MG PO TABS
1000.0000 mg | ORAL_TABLET | Freq: Every day | ORAL | Status: DC
  Administered 2019-08-12 – 2019-08-15 (×4): 1000 mg via ORAL
  Filled 2019-08-11 (×4): qty 2

## 2019-08-11 MED ORDER — ALBUTEROL SULFATE (2.5 MG/3ML) 0.083% IN NEBU: 2.5000 mg | INHALATION_SOLUTION | Freq: Four times a day (QID) | RESPIRATORY_TRACT | Status: DC | PRN

## 2019-08-11 MED ORDER — FAMOTIDINE 20 MG PO TABS
20.0000 mg | ORAL_TABLET | Freq: Two times a day (BID) | ORAL | Status: DC
  Administered 2019-08-12 – 2019-08-14 (×6): 20 mg via ORAL
  Filled 2019-08-11 (×6): qty 1

## 2019-08-11 MED ORDER — ALLOPURINOL 100 MG PO TABS
100.0000 mg | ORAL_TABLET | Freq: Every day | ORAL | Status: DC
  Administered 2019-08-12 – 2019-08-14 (×3): 100 mg via ORAL
  Filled 2019-08-11 (×4): qty 1

## 2019-08-11 MED ORDER — GABAPENTIN 400 MG PO CAPS
400.0000 mg | ORAL_CAPSULE | Freq: Two times a day (BID) | ORAL | Status: DC
  Administered 2019-08-12 – 2019-08-14 (×6): 400 mg via ORAL
  Filled 2019-08-11 (×6): qty 1

## 2019-08-11 MED ORDER — DILTIAZEM HCL ER COATED BEADS 120 MG PO CP24
120.0000 mg | ORAL_CAPSULE | Freq: Every day | ORAL | Status: DC
  Administered 2019-08-12 – 2019-08-14 (×3): 120 mg via ORAL
  Filled 2019-08-11 (×4): qty 1

## 2019-08-11 MED ORDER — SERTRALINE HCL 50 MG PO TABS
50.0000 mg | ORAL_TABLET | Freq: Every day | ORAL | Status: DC
  Administered 2019-08-12 – 2019-08-14 (×3): 50 mg via ORAL
  Filled 2019-08-11 (×4): qty 1

## 2019-08-11 MED ORDER — EMTRICITAB-RILPIVIR-TENOFOV AF 200-25-25 MG PO TABS
1.0000 | ORAL_TABLET | Freq: Every day | ORAL | Status: DC
  Administered 2019-08-12 – 2019-08-15 (×4): 1 via ORAL
  Filled 2019-08-11 (×4): qty 1

## 2019-08-11 MED ORDER — INSULIN GLARGINE 100 UNIT/ML ~~LOC~~ SOLN
20.0000 [IU] | Freq: Every day | SUBCUTANEOUS | Status: DC
  Administered 2019-08-12 – 2019-08-14 (×3): 20 [IU] via SUBCUTANEOUS
  Filled 2019-08-11 (×4): qty 0.2

## 2019-08-11 MED ORDER — INSULIN ASPART 100 UNIT/ML ~~LOC~~ SOLN
0.0000 [IU] | Freq: Three times a day (TID) | SUBCUTANEOUS | Status: DC
  Administered 2019-08-12: 5 [IU] via SUBCUTANEOUS
  Administered 2019-08-12 – 2019-08-13 (×2): 2 [IU] via SUBCUTANEOUS
  Administered 2019-08-13: 3 [IU] via SUBCUTANEOUS
  Administered 2019-08-14: 2 [IU] via SUBCUTANEOUS
  Administered 2019-08-14: 3 [IU] via SUBCUTANEOUS
  Administered 2019-08-15: 2 [IU] via SUBCUTANEOUS

## 2019-08-11 MED ORDER — HYDROXYZINE HCL 25 MG PO TABS
25.0000 mg | ORAL_TABLET | Freq: Three times a day (TID) | ORAL | Status: DC | PRN
  Administered 2019-08-14: 25 mg via ORAL
  Filled 2019-08-11: qty 1

## 2019-08-11 MED ORDER — ALBUTEROL SULFATE HFA 108 (90 BASE) MCG/ACT IN AERS: 1.0000 | INHALATION_SPRAY | Freq: Four times a day (QID) | RESPIRATORY_TRACT | Status: DC | PRN

## 2019-08-11 MED ORDER — SODIUM CHLORIDE 0.9% FLUSH: 3.0000 mL | Freq: Once | INTRAVENOUS | Status: DC

## 2019-08-11 NOTE — ED Notes (Signed)
Pt will not answer question other to say he is suicidal

## 2019-08-11 NOTE — ED Triage Notes (Signed)
Per EMS, called to Extended stay, pt reports chest pain, has been using crack for 4 days, last use 2 hours ago.  TOld EMS he was suicidal, no meds for 5 days. EMS placed 20G in L AC, gave 324 ASA and 2 nitro, no change in pain, bp dropped from 178/100 to 132/90.  CBG 103

## 2019-08-11 NOTE — ED Notes (Signed)
Pt c/o being cold thermostat adjusted and blankets given

## 2019-08-11 NOTE — Patient Outreach (Signed)
[facility]) Care Management  08/11/2019  Emersen Carroll 1950-11-12 [id number]   Noted that member is currently in the ED with complaints of chest pain and suicidal ideations as well as drug use of the past several days.  Hospital liaisons notified, will follow up pending discharge.  Valente David, [place], MSN [place] [phone]

## 2019-08-11 NOTE — ED Notes (Signed)
RN contacted [place] NP about pt's meds not being ordered. NP states she was going to look into it.

## 2019-08-11 NOTE — ED Notes (Signed)
Pt is NSR on monitor 

## 2019-08-11 NOTE — ED Provider Notes (Signed)
[facility] EMERGENCY DEPARTMENT Provider Note   CSN: [id number] Arrival date & time: 08/11/19  4585     History   Chief Complaint Chief Complaint  Patient presents with  . Chest Pain  . Suicidal    HPI Michael [name] is a 68 y.o. male.     The history is provided by the patient and medical records. No language interpreter was used.  Chest Pain Pain location:  Substernal area Pain quality: pressure and sharp   Pain severity:  Severe Onset quality:  Gradual Duration:  3 days Timing:  Constant Progression:  Waxing and waning Chronicity:  Recurrent Context: breathing and drug use   Relieved by:  Nothing Worsened by:  Coughing and deep breathing Ineffective treatments:  None tried Associated symptoms: cough, nausea, shortness of breath and vomiting   Associated symptoms: no abdominal pain, no altered mental status, no anxiety, no back pain, no claudication, no diaphoresis, no dizziness, no fatigue, no fever, no headache, no lower extremity edema, no near-syncope, no numbness, no palpitations and no weakness   Risk factors: coronary artery disease, diabetes mellitus, hypertension, male sex and prior DVT/PE     Past Medical History:  Diagnosis Date  . A-fib ([place])   . Asthma    No PFTs, history of childhood asthma  . CAD (coronary artery disease)    a. 06/2013 STEMI/PCI ([facility]): LAD w/ thrombus (treated with BMS), mid 75%, D2 75%; LCX OM2 75%; RCA small, PDA 95%, PLV 95%;  b. 10/2013 Cath/PCI: ISR w/in LAD (Promus DES x 2), borderline OM2 lesion;  c. 01/2014 MV: Intermediate risk, medium-sized distal ant wall infarct w/ very small amt of peri-infarct ischemia. EF 60%.  . Cellulitis 04/2014   left facial  . Chondromalacia of medial femoral condyle    Left knee MRI 04/28/12: Chondromalacia of the medial femoral condyle with slight peripheral degeneration of the meniscocapsular junction of the medial meniscus; followed by sports medicine  . Collagen vascular  disease ([name])   . Crack cocaine use    for 20+ years, has been enrolled in detox programs in the past  . Depression    with history of hospitalization for suicidal ideation  . Diabetes mellitus 2002   Diagnosed in 2002, started insulin in 2012  . Gout   . Gout 04/28/2012  . Headache(784.0)    CT head 08/2011: Periventricular and subcortical white matter hypodensities are most in keeping with chronic microangiopathic change  . HIV infection [facility]) Nov 2012   Followed by [name]  . Hyperlipidemia   . Hypertension   . Pulmonary embolism [facility])     Patient Active Problem List   Diagnosis Date Noted  . Osteomyelitis ([place]) 07/17/2019  . Abscess or cellulitis of toe, right   . Toe pain, right-second   . MDD (major depressive disorder), recurrent episode, severe ([place]) 06/15/2019  . Respiratory distress 04/12/2019  . Pneumonia due to severe acute respiratory syndrome coronavirus 2 (SARS-CoV-2) 04/11/2019  . COVID-19 virus infection 03/19/2019  . Chest pain 10/03/2018  . Malnutrition of moderate degree 09/21/2018  . MDD (major depressive disorder), recurrent episode, moderate ([place])   . Type II diabetes mellitus with renal manifestations ([place]) 05/04/2018  . GERD (gastroesophageal reflux disease) 05/04/2018  . S/P CABG (coronary artery bypass graft)   . Left testicular pain   . Depression 02/20/2018  . Epididymo-orchitis, acute 02/20/2018  . Essential hypertension 02/20/2018  . Precordial chest pain 02/20/2018  . AKI (acute kidney injury) ([place]) 02/14/2018  .  HCAP (healthcare-associated pneumonia) 02/14/2018  . History of pulmonary embolism 07/04/2017  . Acute hyponatremia 05/11/2017  . Hyperglycemia due to type 2 diabetes mellitus ([place]) 05/11/2017  . CHF (congestive heart failure) ([place]) 04/01/2017  . Hepatitis C 12/30/2016  . Chronic ischemic heart disease 11/12/2016  . Paroxysmal A-fib ([place]) 11/12/2016  . Back pain 04/18/2016  . S/P carotid endarterectomy 11/15/2015  . MDD  (major depressive disorder), recurrent severe, without psychosis ([place]) 09/09/2015  . Cocaine-induced mood disorder ([place]) 08/14/2015  . Cocaine abuse with cocaine-induced mood disorder ([place]) 08/14/2015  . Gout 07/10/2015  . Acute renal failure superimposed on stage 3 chronic kidney disease ([place]) 03/06/2015  . Normocytic anemia 03/06/2015  . Chronic kidney disease, stage III (moderate) 03/06/2015  . Hypoglycemia   . Encounter for general adult medical examination with abnormal findings 02/09/2015  . Cocaine use disorder, severe, dependence ([place]) 12/13/2014  . Substance or medication-induced depressive disorder with onset during withdrawal ([place]) 12/13/2014  . Severe recurrent major depressive disorder with psychotic features ([place]) 12/12/2014  . Cervicalgia 06/28/2014  . Lumbar radiculopathy, chronic 06/28/2014  . Asthma, chronic 02/03/2014  . 3-vessel CAD 06/24/2013  . ED (erectile dysfunction) of organic origin 07/07/2012  . Hypertension goal BP (blood pressure) < 140/80 04/29/2012  . Chondromalacia of left knee 03/19/2012  . Hyperlipidemia with target LDL less than 100 02/12/2012  . Fibromyalgia 02/12/2012  . HIV (human immunodeficiency virus infection) ([place]) 08/27/2011  . Uncontrolled type 2 diabetes with neuropathy ([place]) 10/17/2000    Past Surgical History:  Procedure Laterality Date  . AMPUTATION Right 07/21/2019   Procedure: RIGHT SECOND TOE AMPUTATION;  Surgeon: [name], MD;  Location: [place];  Service: Orthopedics;  Laterality: Right;  . BACK SURGERY     1988  . BOWEL RESECTION    . CARDIAC SURGERY    . CERVICAL SPINE SURGERY     " rods in my neck "  . CORONARY ARTERY BYPASS GRAFT    . CORONARY STENT PLACEMENT    . NM MYOCAR PERF WALL MOTION  12/27/2011   normal  . SPINE SURGERY          Home Medications    Prior to Admission medications   Medication Sig Start Date End Date Taking? Authorizing Provider  acetaminophen (TYLENOL) 325 MG tablet Take 325 mg by  mouth every 6 (six) hours as needed for mild pain.    [provider]  albuterol (VENTOLIN HFA) 108 (90 Base) MCG/ACT inhaler Inhale 1-2 puffs into the lungs every 6 (six) hours as needed for wheezing or shortness of breath. 06/19/19   Mallie Darting, NP  allopurinol (ZYLOPRIM) 100 MG tablet Take 1 tablet by mouth daily. 07/30/19   [provider]  aspirin 81 MG chewable tablet Chew 1 tablet (81 mg total) by mouth daily. Heart health 07/30/19 08/29/19  Elodia Florence., MD  colchicine 0.6 MG tablet Take 0.6 mg by mouth as needed.  12/31/17   [provider]  diltiazem (CARDIZEM CD) 120 MG 24 hr capsule Take 1 capsule (120 mg total) by mouth daily. For blood pressure 07/30/19 08/29/19  Elodia Florence., MD  emtricitabine-rilpivir-tenofovir AF (ODEFSEY) 200-25-25 MG TABS tablet Take 1 tablet by mouth daily with breakfast. For HIV infection 07/30/19 08/29/19  Elodia Florence., MD  famotidine (PEPCID) 20 MG tablet Take 1 tablet (20 mg total) by mouth 2 (two) times daily. For heart burn 07/30/19 08/29/19  Elodia Florence., MD  ferrous sulfate  325 (65 FE) MG tablet Take 1 tablet (325 mg total) by mouth 2 (two) times daily with a meal. For low iron 07/30/19 08/29/19  Elodia Florence., MD  fluticasone (FLOVENT HFA) 44 MCG/ACT inhaler Inhale 1 puff into the lungs 2 (two) times daily. For shortness of breath 06/19/19   Mallie Darting, NP  folic acid (FOLVITE) 1 MG tablet Take 1 tablet (1 mg total) by mouth daily. For folate replacement Patient not taking: Reported on 08/06/2019 07/30/19 08/29/19  Elodia Florence., MD  gabapentin (NEURONTIN) 400 MG capsule Take 1 capsule (400 mg total) by mouth 2 (two) times daily. For agitation/neuropathy 07/30/19 08/29/19  Elodia Florence., MD  HUMALOG 100 UNIT/ML injection Inject 4-6 Units into the skin daily. 07/08/19   [provider]  hydrOXYzine (ATARAX/VISTARIL) 25 MG tablet Take 1 tablet (25 mg  total) by mouth 3 (three) times daily as needed for anxiety. 07/30/19 08/29/19  Elodia Florence., MD  Insulin Glargine (LANTUS) 100 UNIT/ML Solostar Pen Inject 20 Units into the skin daily. 07/30/19 08/29/19  Elodia Florence., MD  metFORMIN (GLUCOPHAGE) 1000 MG tablet Take 1 tablet (1,000 mg total) by mouth daily. 07/30/19 08/29/19  Elodia Florence., MD  nitroGLYCERIN (NITROSTAT) 0.4 MG SL tablet Place 1 tablet (0.4 mg total) under the tongue every 5 (five) minutes as needed for chest pain. 06/19/19   Mallie Darting, NP  polyethylene glycol (MIRALAX / GLYCOLAX) 17 g packet Take 17 g by mouth daily as needed for mild constipation. Patient not taking: Reported on 08/07/2019 06/19/19   Mallie Darting, NP  risperiDONE (RISPERDAL) 0.25 MG tablet Take 1 tablet (0.25 mg total) by mouth 2 (two) times daily. 07/30/19 08/29/19  Elodia Florence., MD  sertraline (ZOLOFT) 50 MG tablet Take 1 tablet (50 mg total) by mouth daily. For depression 07/30/19 08/29/19  Elodia Florence., MD  thiamine 100 MG tablet Take 1 tablet (100 mg total) by mouth daily. For thiamine deficiency Patient not taking: Reported on 08/06/2019 07/30/19 08/29/19  Elodia Florence., MD  traZODone (DESYREL) 100 MG tablet Take 1 tablet (100 mg total) by mouth at bedtime as needed for sleep. 07/30/19 08/29/19  Elodia Florence., MD    Family History Family History  Problem Relation Age of Onset  . Diabetes Mother   . Hypertension Mother   . Hyperlipidemia Mother   . Diabetes Father   . Cancer Father   . Hypertension Father   . Diabetes Brother   . Heart disease Brother   . Diabetes Sister   . Colon cancer Neg Hx     Social History Social History   Tobacco Use  . Smoking status: Never Smoker  . Smokeless tobacco: Never Used  Substance Use Topics  . Alcohol use: No    Alcohol/week: 4.0 standard drinks    Types: 2 Cans of beer, 2 Shots of liquor per week  . Drug use: Yes    Types:  "Crack" cocaine, Cocaine     Allergies   Patient has no known allergies.   Review of Systems Review of Systems  Constitutional: Positive for chills. Negative for diaphoresis, fatigue and fever.  HENT: Negative for congestion.   Eyes: Negative for visual disturbance.  Respiratory: Positive for cough and shortness of breath. Negative for chest tightness, wheezing and stridor.   Cardiovascular: Positive for chest pain. Negative for palpitations, claudication, leg swelling and near-syncope.  Gastrointestinal: Positive for  nausea and vomiting. Negative for abdominal pain, constipation and diarrhea.  Genitourinary: Negative for frequency.  Musculoskeletal: Negative for back pain and neck pain.  Skin: Negative for wound.  Neurological: Negative for dizziness, weakness, light-headedness, numbness and headaches.  Psychiatric/Behavioral: Positive for suicidal ideas. Negative for agitation and confusion.  All other systems reviewed and are negative.    Physical Exam Updated Vital Signs BP (!) 138/100 (BP Location: Right Arm)   Pulse 90   Temp 98.1 F (36.7 C) (Oral)   Resp 16   SpO2 99%   Physical Exam Vitals signs and nursing note reviewed.  Constitutional:      General: He is not in acute distress.    Appearance: He is well-developed. He is not ill-appearing, toxic-appearing or diaphoretic.  HENT:     Head: Normocephalic and atraumatic.  Eyes:     Extraocular Movements: Extraocular movements intact.     Conjunctiva/sclera: Conjunctivae normal.     Pupils: Pupils are equal, round, and reactive to light.  Neck:     Musculoskeletal: Neck supple.  Cardiovascular:     Rate and Rhythm: Normal rate and regular rhythm.     Heart sounds: Normal heart sounds. No murmur.  Pulmonary:     Effort: Pulmonary effort is normal. No respiratory distress.     Breath sounds: Normal breath sounds. No decreased breath sounds, rhonchi or rales.  Chest:     Chest wall: Tenderness present.   Abdominal:     Palpations: Abdomen is soft.     Tenderness: There is no abdominal tenderness.  Musculoskeletal: Normal range of motion.     Right lower leg: He exhibits no tenderness. No edema.     Left lower leg: He exhibits no tenderness. No edema.  Skin:    General: Skin is warm and dry.     Capillary Refill: Capillary refill takes less than 2 seconds.     Findings: No erythema.  Neurological:     General: No focal deficit present.     Mental Status: He is alert.  Psychiatric:        Mood and Affect: Mood normal.      ED Treatments / Results  Labs (all labs ordered are listed, but only abnormal results are displayed) Labs Reviewed  BASIC METABOLIC PANEL - Abnormal; Notable for the following components:      Result Value   CO2 21 (*)    Glucose, Bld 164 (*)    Creatinine, Ser 1.67 (*)    GFR calc non Af Amer 41 (*)    GFR calc Af Amer 48 (*)    All other components within normal limits  CBC - Abnormal; Notable for the following components:   WBC 3.2 (*)    RBC 3.79 (*)    Hemoglobin 9.8 (*)    HCT 31.3 (*)    MCH 25.9 (*)    All other components within normal limits  LIPASE, BLOOD - Abnormal; Notable for the following components:   Lipase 62 (*)    All other components within normal limits  RAPID URINE DRUG SCREEN, HOSP PERFORMED - Abnormal; Notable for the following components:   Cocaine POSITIVE (*)    All other components within normal limits  URINALYSIS, ROUTINE W REFLEX MICROSCOPIC - Abnormal; Notable for the following components:   Hgb urine dipstick SMALL (*)    Ketones, ur 20 (*)    Protein, ur >=300 (*)    Bacteria, UA RARE (*)    All other components within  normal limits  SARS CORONAVIRUS 2 BY RT PCR (HOSPITAL ORDER, [facility])  HEPATIC FUNCTION PANEL  D-DIMER, QUANTITATIVE (NOT AT [facility])  ETHANOL  TROPONIN I (HIGH SENSITIVITY)  TROPONIN I (HIGH SENSITIVITY)  TROPONIN I (HIGH SENSITIVITY)    EKG EKG  Interpretation  Date/Time:  Wednesday August 11 2019 06:57:31 EST Ventricular Rate:  89 PR Interval:  134 QRS Duration: 86 QT Interval:  372 QTC Calculation: 452 R Axis:   74 Text Interpretation: Normal sinus rhythm with sinus arrhythmia Nonspecific T wave abnormality Abnormal ECG When compred to prior, no significant changes seen, No STEMI Confirmed by Antony Blackbird [phone]) on 08/11/2019 9:45:19 AM   Radiology Dg Chest 2 View  Result Date: 08/11/2019 CLINICAL DATA:  Chest pain and shortness-of-breath. EXAM: CHEST - 2 VIEW COMPARISON:  07/16/2019 FINDINGS: Lungs are adequately inflated and otherwise clear. Cardiomediastinal silhouette and remainder of the exam is unchanged. IMPRESSION: No active cardiopulmonary disease. Electronically Signed   By: Marin Olp M.D.   On: 08/11/2019 07:39    Procedures Procedures (including critical care time)  Medications Ordered in ED Medications  sodium chloride flush (NS) 0.9 % injection 3 mL (has no administration in time range)     Initial Impression / Assessment and Plan / ED Course  I have reviewed the triage vital signs and the nursing notes.  Pertinent labs & imaging results that were available during my care of the patient were reviewed by me and considered in my medical decision making (see chart for details).        Michael [name] is a 68 y.o. male with a past medical history significant for HIV, poorly controlled diabetes, hypertension, CAD status post CABG, CKD, hyperlipidemia, recent osteomyelitis of the left second toe status post amputation, pulmonary embolism not on anticoagulation, paroxysmal atrial fibrillation, asthma, CHF, diabetes, depression and crack cocaine use who presents with cough, chills, chest pain, shortness of breath, nausea, vomiting, and malaise.  Patient reports that he had a toe amputation last month and has not followed up with his surgeon yet.  He reports that he has been having worsening shortness  of breath and chest pain for the last few days.  He was seen by EMS this morning and given 324 of aspirin and nitro which helped his pain.  He reports his chest pain is moderate to severe at this time and is more sharp and pleuritic in the central chest rating across his chest.  He reports he has had a white phlegm cough and he reports that he did have coronavirus several months ago.  He says he had some nausea and vomiting but denies diaphoresis.  He denies any constipation or diarrhea.  Denies any urinary symptoms.  He does report he smoked crack cocaine this morning.  He reports the crack cocaine makes his chest pain worse.  He reports poor compliance of medications.    Of note, patient is also reporting he is having new suicidal ideations.  He reports he is try to kill self in the past by slitting his wrists and he reports he has the same plan.  He reports he does have a kitchen knife at home.  He also reports some vague homicidal ideation but has no specific plans to hurt anyone and also reports some visual hallucinations with "bugs crawling on the walls".  He reports he would like to talk to a psychiatrist for help with this.  On exam, chest is tender to palpation.  Lungs  are clear.  Abdomen is nontender.  Patient has pulses in all extremities.  Patient has right second toe amputation with sutures in place.  No purulence or drainage seen.  No significant erythema.  EKG shows no STEMI.  Clinically I am concerned that the patient has history of PE, recent surgery, pleuritic sharp chest pain and shortness of breath, and need to be ruled out for pulmonary embolism with a D-dimer initially.  Also concerned about his chest pain in the setting of crack cocaine use and his prior MI with CABG.  I am also concerned about the patient's chills, productive cough, chest pain, shortness of breath.  Will see what a chest x-ray shows.  As the patient will likely need to see psychiatry during his stay due to the SI  and HI and hallucinations, will get a rapid Covid test as well.  Anticipate reassessment after work-up.  3:17 PM Medical work-up is returned reassuring.  Patient was recently cleared from a cardiac standpoint after several negative troponins during admission.  He has 2 - troponins today.  D-dimer is now negative.  Patient has some evidence of dehydration with ketones in his urine but no UTI seen.  Lipase is slightly elevated, now this is the cause of his chest pain at this time.  Metabolic panel shows similar elevated creatinine to prior.  Given the reassuring work-up, patient is felt medically cleared for psychiatric evaluation.  TTS consult was placed.  Anticipate follow-up on TTS recommendations.  Final Clinical Impressions(s) / ED Diagnoses   Final diagnoses:  Suicidal ideation  Crack cocaine use  Homicidal ideation  Visual hallucination  Chronic chest pain     Clinical Impression: 1. Suicidal ideation   2. Crack cocaine use   3. Homicidal ideation   4. Visual hallucination   5. Chronic chest pain     Disposition: Awaiting psychiatric recommendations.  Patient has been medically cleared for his chest pain that is very similar to prior chronic chest pain and reassuring labs today.  This note was prepared with assistance of Systems analyst. Occasional wrong-word or sound-a-like substitutions may have occurred due to the inherent limitations of voice recognition software.      Tegeler, [name], MD 08/11/19 [phone]

## 2019-08-11 NOTE — ED Notes (Signed)
Spoke w/lab about pt's BMP and trop results and they stated their instruments were down all morning and the people are there to fix them now

## 2019-08-11 NOTE — Care Management (Signed)
ED CM received call from Inpatient CM concerning [facility] referral for homeless resources.  ED CM updated patient's contact information for [facility].

## 2019-08-11 NOTE — ED Notes (Signed)
Getting tts  Sitter at   The bedside

## 2019-08-11 NOTE — Telephone Encounter (Signed)
Called to advise that the pt did not keep his appointment yesterday and is currently in the ER. [place]  And they were not able to reach the pt. Not nursing or pharmacy able to reach the pt not able to offer any other services to the pt.

## 2019-08-11 NOTE — ED Notes (Signed)
Regular dinner tray ordered 

## 2019-08-11 NOTE — BH Assessment (Signed)
Tele Assessment Note   Patient Name: Michael [name] MRN: [id number] Referring Physician: C. Tegeler, MD Location of Patient: [facility] Location of Provider: [place] Department  Michael [name] is a 68 y.o. male who presented to [facility] on voluntary basis with complaint of suicidal ideation, other depressive symptoms, and recent crack cocaine use.   Pt was last assessed by TTS in April 2020.  At that time, he presented with complaint of similar complaints.  Per history, Pt is known to the ED with three previous psychiatric hospitalizations since December 2019 [facility] [place] [place]).  Pt stated that he is homeless, and he was picked up by EMS from an extended stay hotel.  Pt denied any current psychiatric provider.  In April 2020, he was treated by [facility].  Pt came to the ED with complaint of chest pains and expressed suicidal ideation.  Pt reported as follows:  He stated that he has ''never stopped'' being suicidal, and that he has a current plan to overdose (''smoke myself to death'').  Pt also endorsed binge use of cocaine.  He reported using an unknown quantity of crack cocaine over the last four days.  Pt endorsed the following symptoms:  Suicidal ideation with plan to overdose (Pt also endorsed three previous suicide attempts); persistent and unremitting despondency; tearfulness; insomnia; isolation; feelings of worthlessness; fatigue; tearfulness.  Pt also endorsed binge use of crack cocaine as indicated above.  Pt stated that he is experiencing auditory hallucinations -- voices encouraging him to harm himself.  He also reported a history of self-injury (cutting), although he stated he has not done so in over a year.  During assessment, Pt presented as alert and oriented.  He had good eye contact and was cooperative.  He appeared appropriately groomed.  Pt's mood was depressed, and affect was also depressed.  Pt was tearful.  Pt's speech was normal in rate, rhythm, and  volume.  Thought processes were within normal range, and thought content was logical and goal-oriented.  There was no evidence of delusion.  Pt's memory and concentration were intact.  Insight, judgment, and impulse control were fair to poor.  Consulted with T. Hall Busing, NP, who determined that Pt shall be admitted to OBS, stabilized, begin medication, eval in AM.  Diagnosis: Major Depressive Disorder, Recurrent, Severe w/opsychotic features; Cocaine Use Disorder  Past Medical History:  Past Medical History:  Diagnosis Date  . A-fib ([place])   . Asthma    No PFTs, history of childhood asthma  . CAD (coronary artery disease)    a. 06/2013 STEMI/PCI ([facility]): LAD w/ thrombus (treated with BMS), mid 75%, D2 75%; LCX OM2 75%; RCA small, PDA 95%, PLV 95%;  b. 10/2013 Cath/PCI: ISR w/in LAD (Promus DES x 2), borderline OM2 lesion;  c. 01/2014 MV: Intermediate risk, medium-sized distal ant wall infarct w/ very small amt of peri-infarct ischemia. EF 60%.  . Cellulitis 04/2014   left facial  . Chondromalacia of medial femoral condyle    Left knee MRI 04/28/12: Chondromalacia of the medial femoral condyle with slight peripheral degeneration of the meniscocapsular junction of the medial meniscus; followed by sports medicine  . Collagen vascular disease ([place])   . Crack cocaine use    for 20+ years, has been enrolled in detox programs in the past  . Depression    with history of hospitalization for suicidal ideation  . Diabetes mellitus 2002   Diagnosed in 2002, started insulin in 2012  . Gout   . Gout  04/28/2012  . Headache(784.0)    CT head 08/2011: Periventricular and subcortical white matter hypodensities are most in keeping with chronic microangiopathic change  . HIV infection [facility]) Nov 2012   Followed by [name]  . Hyperlipidemia   . Hypertension   . Pulmonary embolism [facility])     Past Surgical History:  Procedure Laterality Date  . AMPUTATION Right 07/21/2019   Procedure: RIGHT SECOND TOE  AMPUTATION;  Surgeon: [name], MD;  Location: [place];  Service: Orthopedics;  Laterality: Right;  . BACK SURGERY     1988  . BOWEL RESECTION    . CARDIAC SURGERY    . CERVICAL SPINE SURGERY     " rods in my neck "  . CORONARY ARTERY BYPASS GRAFT    . CORONARY STENT PLACEMENT    . NM MYOCAR PERF WALL MOTION  12/27/2011   normal  . SPINE SURGERY      Family History:  Family History  Problem Relation Age of Onset  . Diabetes Mother   . Hypertension Mother   . Hyperlipidemia Mother   . Diabetes Father   . Cancer Father   . Hypertension Father   . Diabetes Brother   . Heart disease Brother   . Diabetes Sister   . Colon cancer Neg Hx     Social History:  reports that he has never smoked. He has never used smokeless tobacco. He reports current drug use. Frequency: 4.00 times per week. Drugs: "Crack" cocaine and Cocaine. He reports that he does not drink alcohol.  Additional Social History:  Alcohol / Drug Use Pain Medications: See MAR Prescriptions: See MAR Over the Counter: See MAR History of alcohol / drug use?: Yes Substance #1 Name of Substance 1: Crack cocaine 1 - Age of First Use: 41 1 - Amount (size/oz): Varied 1 - Frequency: Binge use 1 - Duration: Ongoing 1 - Last Use / Amount: 08/10/2019 -- used heavily over last four days  CIWA: CIWA-Ar BP: (!) 176/104 Pulse Rate: 74 COWS:    Allergies: No Known Allergies  Home Medications: (Not in a hospital admission)   OB/GYN Status:  No LMP for male patient.  General Assessment Data TTS Assessment: In system Is this a Tele or Face-to-Face Assessment?: Tele Assessment Is this an Initial Assessment or a Re-assessment for this encounter?: Initial Assessment Patient Accompanied by:: N/A Language Other than English: No Living Arrangements: Homeless/Shelter Marital status: Widowed Pregnancy Status: No Living Arrangements: Other (Comment)(Recently a hotel, now homeless.) Can pt return to current living  arrangement?: Yes Admission Status: Voluntary Is patient capable of signing voluntary admission?: Yes Referral Source: Self/Family/Friend Insurance type: [facility] Living Arrangements: Other (Comment)(Recently a hotel, now homeless.) Name of Psychiatrist: None currently Name of Therapist: None currently  Education Status Is patient currently in school?: No Is the patient employed, unemployed or receiving disability?: Receiving disability income  Risk to self with the past 6 months Suicidal Ideation: Yes-Currently Present Has patient been a risk to self within the past 6 months prior to admission? : Yes Suicidal Intent: No Has patient had any suicidal intent within the past 6 months prior to admission? : No Is patient at risk for suicide?: Yes Suicidal Plan?: Yes-Currently Present Has patient had any suicidal plan within the past 6 months prior to admission? : Yes Specify Current Suicidal Plan: Overdose Access to Means: Yes Specify Access to Suicidal Means: Street drugs, specifically crack What has been your use  of drugs/alcohol within the last 12 months?: crack cocaine Previous Attempts/Gestures: Yes How many times?: 3 Other Self Harm Risks: drug use Triggers for Past Attempts: Unknown Intentional Self Injurious Behavior: Cutting Comment - Self Injurious Behavior: Hx of cutting, but not recently Family Suicide History: No Recent stressful life event(s): Job Loss(homeless) Persecutory voices/beliefs?: No Depression: Yes Depression Symptoms: Despondent, Insomnia, Tearfulness, Isolating, Loss of interest in usual pleasures, Feeling worthless/self pity, Guilt Substance abuse history and/or treatment for substance abuse?: Yes Suicide prevention information given to non-admitted patients: Not applicable  Risk to Others within the past 6 months Homicidal Ideation: No Does patient have any lifetime risk of violence toward others beyond the six months prior to  admission? : No Thoughts of Harm to Others: No Current Homicidal Intent: No Current Homicidal Plan: No Access to Homicidal Means: No History of harm to others?: No Assessment of Violence: None Noted Does patient have access to weapons?: No Criminal Charges Pending?: No Does patient have a court date: No Is patient on probation?: No  Psychosis Hallucinations: Auditory, With command(Voice saying to hurt self) Delusions: None noted  Mental Status Report Appearance/Hygiene: Unremarkable, In scrubs Eye Contact: Good Motor Activity: Freedom of movement, Unremarkable Speech: Logical/coherent Level of Consciousness: Alert Mood: Depressed Affect: Depressed Anxiety Level: None Thought Processes: Coherent, Relevant Judgement: Partial Orientation: Person, Time, Situation, Place Obsessive Compulsive Thoughts/Behaviors: None  Cognitive Functioning Concentration: Normal Memory: Recent Intact, Recent Impaired Is patient IDD: No Insight: Fair Impulse Control: Poor Appetite: Fair Have you had any weight changes? : No Change Sleep: Decreased Total Hours of Sleep: 2  ADLScreening [facility] Assessment Services) Patient's cognitive ability adequate to safely complete daily activities?: Yes Patient able to express need for assistance with ADLs?: Yes Independently performs ADLs?: Yes (appropriate for developmental age)  Prior Inpatient Therapy Prior Inpatient Therapy: Yes Prior Therapy Dates: 2019, 2020 Prior Therapy Facilty/Provider(s): [place], [facility], [place] Reason for Treatment: Suicidal ideation  Prior Outpatient Therapy Prior Outpatient Therapy: Yes Prior Therapy Dates: 2020 Prior Therapy Facilty/Provider(s): [facility] Reason for Treatment: Depressive symptoms Does patient have an ACCT team?: No Does patient have Intensive In-House Services?  : No Does patient have [facility] services? : No Does patient have P4[facility] services?: No  ADL Screening (condition at time of admission) Patient's  cognitive ability adequate to safely complete daily activities?: Yes Is the patient deaf or have difficulty hearing?: No Does the patient have difficulty seeing, even when wearing glasses/contacts?: No Does the patient have difficulty concentrating, remembering, or making decisions?: No Patient able to express need for assistance with ADLs?: Yes Does the patient have difficulty dressing or bathing?: No Independently performs ADLs?: Yes (appropriate for developmental age) Does the patient have difficulty walking or climbing stairs?: No Weakness of Legs: None Weakness of Arms/Hands: None  Home Assistive Devices/Equipment Home Assistive Devices/Equipment: None  Therapy Consults (therapy consults require a physician order) PT Evaluation Needed: No OT Evalulation Needed: No SLP Evaluation Needed: No Abuse/Neglect Assessment (Assessment to be complete while patient is alone) Abuse/Neglect Assessment Can Be Completed: Yes Physical Abuse: Denies Verbal Abuse: Denies Sexual Abuse: Denies Exploitation of patient/patient's resources: Denies Self-Neglect: Denies Values / Beliefs Cultural Requests During Hospitalization: None Spiritual Requests During Hospitalization: None Consults Spiritual Care Consult Needed: No Social Work Consult Needed: No Regulatory affairs officer (For Healthcare) Does Patient Have a Medical Advance Directive?: No          Disposition:  Disposition Initial Assessment Completed for this Encounter: Yes Patient referred to: Other (Comment)(Admit to OBS, eval in AM)  This service was provided via telemedicine using a 2-way, interactive audio and video technology.  Names of all persons participating in this telemedicine service and their role in this encounter. Name: J. Gilkeson Role: Pt             Marlowe Aschoff 08/11/2019 4:29 PM

## 2019-08-12 ENCOUNTER — Ambulatory Visit: Payer: Medicare Other | Admitting: *Deleted

## 2019-08-12 ENCOUNTER — Other Ambulatory Visit: Payer: Self-pay

## 2019-08-12 ENCOUNTER — Ambulatory Visit: Payer: Self-pay | Admitting: Pharmacist

## 2019-08-12 LAB — CBG MONITORING, ED
Glucose-Capillary: 224 mg/dL — ABNORMAL HIGH (ref 70–99)
Glucose-Capillary: 76 mg/dL (ref 70–99)
Glucose-Capillary: 127 mg/dL — ABNORMAL HIGH (ref 70–99)
Glucose-Capillary: 81 mg/dL (ref 70–99)
Glucose-Capillary: 77 mg/dL (ref 70–99)

## 2019-08-12 MED ORDER — ONDANSETRON 4 MG PO TBDP
4.0000 mg | ORAL_TABLET | Freq: Once | ORAL | Status: AC
  Administered 2019-08-12: 4 mg via ORAL
  Filled 2019-08-12: qty 1

## 2019-08-12 NOTE — BH Assessment (Deleted)
[facility] Assessment Progress Note   Per Priscille Loveless, NP, Psych Consult 08/12/2019 @9 :24  Telepsych Consultation  Reason for Consult: SI  Referring Physician: EDP  Location of Patient:  Location of Provider: [facility]  Patient Identification: Michael [name]  MRN: [id number]  Principal Diagnosis: <principal problem not specified>  Diagnosis: Active Problems:  * No active hospital problems. *   Total Time spent with patient: 15 minutes  Subjective:  Michael [name] is a 68 y.o. male patient who presents with depression, suicidal thoughts, substance abuse and hallucinations. He is very familiar to our facility.  HPI:  Per Consult Note: Patient states he is currently homeless after attempting to reconnect with a family member who could provide housing. Patient states "that fell through with" and started feeling suicidal with "no hope left."  Patient stressors include homelessness and substance use. Denies any hx trauma.  Past Psychiatric History: MDD, GAD, Cocaine Use Disorder  Risk to Self: Suicidal Ideation: Yes-Currently Present  Suicidal Intent: No  Is patient at risk for suicide?: Yes  Suicidal Plan?: Yes-Currently Present  Specify Current Suicidal Plan: Overdose  Access to Means: Yes  Specify Access to Suicidal Means: Street drugs, specifically crack  What has been your use of drugs/alcohol within the last 12 months?: crack cocaine  How many times?: 3  Other Self Harm Risks: drug use  Triggers for Past Attempts: Unknown  Intentional Self Injurious Behavior: Cutting  Comment - Self Injurious Behavior: Hx of cutting, but not recently  Risk to Others: Homicidal Ideation: No  Thoughts of Harm to Others: No  Current Homicidal Intent: No  Current Homicidal Plan: No  Access to Homicidal Means: No  History of harm to others?: No  Assessment of Violence: None Noted  Does patient have access to weapons?: No  Criminal Charges Pending?: No  Does patient  have a court date: No  Prior Inpatient Therapy: Prior Inpatient Therapy: Yes  Prior Therapy Dates: 2019, 2020  Prior Therapy Facilty/Provider(s): [facility], [facility], [place]  Reason for Treatment: Suicidal ideation  Prior Outpatient Therapy: Prior Outpatient Therapy: Yes  Prior Therapy Dates: 2020  Prior Therapy Facilty/Provider(s): [facility]  Reason for Treatment: Depressive symptoms  Does patient have an ACCT team?: No  Does patient have Intensive In-House Services? : No  Does patient have [facility] services? : No  Does patient have P4CC services?: No  Past Medical History:      Past Medical History:  Diagnosis Date  . A-fib ([place])   . Asthma    No PFTs, history of childhood asthma  . CAD (coronary artery disease)    a. 06/2013 STEMI/PCI ([facility]): LAD w/ thrombus (treated with BMS), mid 75%, D2 75%; LCX OM2 75%; RCA small, PDA 95%, PLV 95%; b. 10/2013 Cath/PCI: ISR w/in LAD (Promus DES x 2), borderline OM2 lesion; c. 01/2014 MV: Intermediate risk, medium-sized distal ant wall infarct w/ very small amt of peri-infarct ischemia. EF 60%.  . Cellulitis 04/2014   left facial  . Chondromalacia of medial femoral condyle    Left knee MRI 04/28/12: Chondromalacia of the medial femoral condyle with slight peripheral degeneration of the meniscocapsular junction of the medial meniscus; followed by sports medicine  . Collagen vascular disease ([place])   . Crack cocaine use    for 20+ years, has been enrolled in detox programs in the past  . Depression    with history of hospitalization for suicidal ideation  . Diabetes mellitus 2002   Diagnosed in 2002, started insulin in  2012  . Gout   . Gout 04/28/2012  . Headache(784.0)    CT head 08/2011: Periventricular and subcortical white matter hypodensities are most in keeping with chronic microangiopathic change  . HIV infection [facility]) Nov 2012   Followed by [name]  . Hyperlipidemia   . Hypertension   . Pulmonary embolism [facility])         Past Surgical History:   Procedure Laterality Date  . AMPUTATION Right 07/21/2019   Procedure: RIGHT SECOND TOE AMPUTATION; Surgeon: [name], MD; Location: [place]; Service: Orthopedics; Laterality: Right;  . BACK SURGERY     1988  . BOWEL RESECTION    . CARDIAC SURGERY    . CERVICAL SPINE SURGERY     " rods in my neck "  . CORONARY ARTERY BYPASS GRAFT    . CORONARY STENT PLACEMENT    . NM MYOCAR PERF WALL MOTION  12/27/2011   normal  . SPINE SURGERY     Family History:       Family History  Problem Relation Age of Onset  . Diabetes Mother   . Hypertension Mother   . Hyperlipidemia Mother   . Diabetes Father   . Cancer Father   . Hypertension Father   . Diabetes Brother   . Heart disease Brother   . Diabetes Sister   . Colon cancer Neg Hx    Family Psychiatric History: Denies  Social History:  Social History       Substance and Sexual Activity  Alcohol Use No  . Alcohol/week: 4.0 standard drinks  . Types: 2 Cans of beer, 2 Shots of liquor per week   Social History       Substance and Sexual Activity  Drug Use Yes  . Frequency: 4.0 times per week  . Types: "Crack" cocaine, Cocaine   Comment: Recent use of crack   Social History        Socioeconomic History  . Marital status: Widowed    Spouse name: Not on file  . Number of children: 2  . Years of education: 73  . Highest education level: 12th grade  Occupational History    Employer: UNEMPLOYED    Comment: 04/2016  Social Needs  . Financial resource strain: Somewhat hard  . Food insecurity    Worry: Often true    Inability: Often true  . Transportation needs    Medical: Yes    Non-medical: Yes  Tobacco Use  . Smoking status: Never Smoker  . Smokeless tobacco: Never Used  Substance and Sexual Activity  . Alcohol use: No    Alcohol/week: 4.0 standard drinks    Types: 2 Cans of beer, 2 Shots of liquor per week  . Drug use: Yes    Frequency: 4.0 times per week    Types: "Crack" cocaine, Cocaine    Comment: Recent  use of crack  . Sexual activity: Yes    Comment: accepted condoms  Lifestyle  . Physical activity    Days per week: 0 days    Minutes per session: 0 min  . Stress: Very much  Relationships  . Social Herbalist on phone: Once a week    Gets together: Never    Attends religious service: Never    Active member of club or organization: No    Attends meetings of clubs or organizations: Never    Relationship status: Widowed  Other Topics Concern  . Not on file  Social History Narrative  Currently staying with a friend in [place]. Was staying @ local motel until a few days ago - left b/c of bed bugs.   [facility]. Not followed by a psychiatrist.   Additional Social History: N/A   Allergies: No Known Allergies  Labs:  Lab Results Last 48 Hours  Medications:           Current Facility-Administered Medications  Medication Dose Route Frequency Provider Last Rate Last Dose  . albuterol (PROVENTIL) (2.5 MG/3ML) 0.083% nebulizer solution 2.5 mg 2.5 mg Inhalation Q6H PRN Ward, Kristen N, DO    . allopurinol (ZYLOPRIM) tablet 100 mg 100 mg Oral Daily Ward, Kristen N, DO    . aspirin chewable tablet 81 mg 81 mg Oral Daily Ward, Kristen N, DO    .  diltiazem (CARDIZEM CD) 24 hr capsule 120 mg 120 mg Oral Daily Ward, Kristen N, DO    . emtricitabine-rilpivir-tenofovir AF (ODEFSEY) 200-25-25 MG per tablet 1 tablet 1 tablet Oral Q breakfast Ward, Kristen N, DO    . famotidine (PEPCID) tablet 20 mg 20 mg Oral BID Ward, Kristen N, DO  20 mg at 08/12/19 0021  . gabapentin (NEURONTIN) capsule 400 mg 400 mg Oral BID Ward, Kristen N, DO  400 mg at 08/12/19 0021  . hydrOXYzine (ATARAX/VISTARIL) tablet 25 mg 25 mg Oral TID PRN Ward, Kristen N, DO    . insulin aspart (novoLOG) injection 0-15 Units 0-15 Units Subcutaneous TID WC Ward, Kristen N, DO  5 Units at 08/12/19 0806  . insulin glargine (LANTUS) injection 20 Units 20 Units Subcutaneous Daily Ward, Kristen N, DO    . metFORMIN (GLUCOPHAGE) tablet 1,000 mg 1,000 mg Oral Daily Ward, Kristen N, DO  1,000 mg at 08/12/19 0805  . sertraline (ZOLOFT) tablet 50 mg 50 mg Oral Daily Ward, Kristen N, DO    . sodium chloride flush (NS) 0.9 % injection 3 mL 3 mL Intravenous Once Tegeler, [name], MD    . traZODone (DESYREL) tablet 100 mg 100 mg Oral QHS PRN Ward, Delice Bison, DO  100 mg at 08/12/19 0022         Current Outpatient Medications  Medication Sig Dispense Refill  . acetaminophen (TYLENOL) 325 MG tablet Take 325 mg by mouth every 6 (six) hours as needed for mild pain.    Marland Kitchen albuterol (VENTOLIN HFA) 108 (90 Base) MCG/ACT inhaler Inhale 1-2 puffs into the lungs every 6 (six) hours as needed for wheezing or shortness of breath.    . allopurinol (ZYLOPRIM) 100 MG tablet Take 1 tablet by mouth daily.    Marland Kitchen aspirin 81 MG chewable tablet Chew 1 tablet (81 mg total) by mouth daily. Heart health 30 tablet 0  . diltiazem (CARDIZEM CD) 120 MG 24 hr capsule Take 1 capsule (120 mg total) by mouth daily. For blood pressure 30 capsule 0  . emtricitabine-rilpivir-tenofovir AF (ODEFSEY) 200-25-25 MG TABS tablet Take 1 tablet by mouth daily with breakfast. For HIV infection 30 tablet 0  . famotidine (PEPCID) 20 MG  tablet Take 1 tablet (20 mg total) by mouth 2 (two) times daily. For heart burn 60 tablet 0  . HUMALOG 100 UNIT/ML injection Inject 4-6 Units into the skin daily.    . hydrOXYzine (ATARAX/VISTARIL) 25 MG tablet Take 1 tablet (25 mg total) by mouth 3 (three) times daily as needed for anxiety. 90 tablet 0  . Insulin Glargine (LANTUS) 100 UNIT/ML Solostar Pen Inject 20 Units into the skin daily. 15 mL 0  . metFORMIN (GLUCOPHAGE) 1000 MG tablet Take 1 tablet (1,000 mg total) by mouth daily. 30 tablet 0  . nitroGLYCERIN (NITROSTAT) 0.4 MG SL tablet Place 1 tablet (0.4 mg total) under the tongue every 5 (five) minutes as needed for chest pain.  12  . sertraline (ZOLOFT) 50 MG tablet Take 1  tablet (50 mg total) by mouth daily. For depression 30 tablet 0  . traZODone (DESYREL) 100 MG tablet Take 1 tablet (100 mg total) by mouth at bedtime as needed for sleep. 30 tablet 0  . gabapentin (NEURONTIN) 400 MG capsule Take 1 capsule (400 mg total) by mouth 2 (two) times daily. For agitation/neuropathy 60 capsule 0   Musculoskeletal:  Strength & Muscle Tone: within normal limits  Gait & Station: normal  Patient leans: N/A  Psychiatric Specialty Exam:  Physical Exam  Nursing note and vitals reviewed.  Constitutional: He is oriented to person, place, and time. He appears well-developed.  HENT:  Head: Normocephalic and atraumatic.  Neck: Normal range of motion.  Cardiovascular: Normal rate.  Respiratory: Effort normal.  Neurological: He is alert and oriented to person, place, and time.  Psychiatric: His speech is normal and behavior is normal. Cognition and memory are normal. He expresses impulsivity. He exhibits a depressed mood. He expresses suicidal ideation. He expresses suicidal plans (Plan to overdose on crack cocaine).    Review of Systems  Constitutional: Negative.  HENT: Negative.  Eyes: Negative.  Respiratory: Negative.  Cardiovascular: Negative.  Gastrointestinal: Negative.  Genitourinary:  Negative.  Musculoskeletal: Negative.  Skin: Negative.  Neurological: Negative.  Endo/Heme/Allergies: Negative.  Psychiatric/Behavioral: Positive for depression, substance abuse and suicidal ideas.    Blood pressure 114/66, pulse 80, temperature 98.9 F (37.2 C), temperature source Oral, resp. rate 18, SpO2 100 %.There is no height or weight on file to calculate BMI.  General Appearance: Casual  Eye Contact: Fair  Speech: Clear and Coherent  Volume: Normal  Mood: Depressed  Affect: Congruent  Thought Process: Coherent and Descriptions of Associations: Intact  Orientation: Full (Time, Place, and Person)  Thought Content: Logical  Suicidal Thoughts: Yes. with intent/plan  Homicidal Thoughts: No  Memory: Immediate; Good  Recent; Good  Remote; Good  Judgement: Fair  Insight: Fair  Psychomotor Activity: Normal  Concentration: Concentration: Fair and Attention Span: Fair  Recall: Good  Fund of Knowledge: Good  Language: Good  Akathisia: No  Handed: Right  AIMS (if indicated): N/A  Assets: Desire for Improvement  ADL's: Intact  Cognition: WNL  Sleep: N/A  Treatment Plan Summary:  Daily contact with patient to assess and evaluate symptoms and progress in treatment  Disposition: Recommend psychiatric Inpatient admission when medically cleared.  This service was provided via telemedicine using a 2-way, interactive audio and video technology.  Names of all persons participating in this telemedicine service and their role in this encounter.  Name: [name] Patient  Letitia Libra FNP  J. Mariea Clonts MD  Suella Broad, FNP  08/12/2019 9:25 AM  Patient seen by telemedicine for psychiatric evaluation, chart reviewed and case discussed with the physician extender and developed treatment plan. Reviewed the information documented and agree with the treatment plan.  Sheran Fava  08/12/19 9:25 AM  Telepsych Consultation  Reason for Consult: SI  Referring Physician: EDP   Location of Patient:  Location of Provider: [facility] Department  Patient Identification: Michael [name]  MRN: [id number]  Principal Diagnosis: <principal problem not specified>  Diagnosis: Active Problems:  * No active hospital problems. *   Total Time spent with patient: 15 minutes  Subjective:  Michael [name] is a 68 y.o. male patient who presents with depression, suicidal thoughts, substance abuse and hallucinations. He is very familiar to our facility.  HPI:  Per Consult Note: Patient states he is currently homeless after attempting to reconnect with  a family member who could provide housing. Patient states "that fell through with" and started feeling suicidal with "no hope left."  Patient stressors include homelessness and substance use. Denies any hx trauma.  Past Psychiatric History: MDD, GAD, Cocaine Use Disorder  Risk to Self: Suicidal Ideation: Yes-Currently Present  Suicidal Intent: No  Is patient at risk for suicide?: Yes  Suicidal Plan?: Yes-Currently Present  Specify Current Suicidal Plan: Overdose  Access to Means: Yes  Specify Access to Suicidal Means: Street drugs, specifically crack  What has been your use of drugs/alcohol within the last 12 months?: crack cocaine  How many times?: 3  Other Self Harm Risks: drug use  Triggers for Past Attempts: Unknown  Intentional Self Injurious Behavior: Cutting  Comment - Self Injurious Behavior: Hx of cutting, but not recently  Risk to Others: Homicidal Ideation: No  Thoughts of Harm to Others: No  Current Homicidal Intent: No  Current Homicidal Plan: No  Access to Homicidal Means: No  History of harm to others?: No  Assessment of Violence: None Noted  Does patient have access to weapons?: No  Criminal Charges Pending?: No  Does patient have a court date: No  Prior Inpatient Therapy: Prior Inpatient Therapy: Yes  Prior Therapy Dates: 2019, 2020  Prior Therapy Facilty/Provider(s): [place], [facility], [place]   Reason for Treatment: Suicidal ideation  Prior Outpatient Therapy: Prior Outpatient Therapy: Yes  Prior Therapy Dates: 2020  Prior Therapy Facilty/Provider(s): [facility]  Reason for Treatment: Depressive symptoms  Does patient have an ACCT team?: No  Does patient have Intensive In-House Services? : No  Does patient have [facility] services? : No  Does patient have P4CC services?: No  Past Medical History:      Past Medical History:  Diagnosis Date  . A-fib ([place])   . Asthma    No PFTs, history of childhood asthma  . CAD (coronary artery disease)    a. 06/2013 STEMI/PCI ([facility]): LAD w/ thrombus (treated with BMS), mid 75%, D2 75%; LCX OM2 75%; RCA small, PDA 95%, PLV 95%; b. 10/2013 Cath/PCI: ISR w/in LAD (Promus DES x 2), borderline OM2 lesion; c. 01/2014 MV: Intermediate risk, medium-sized distal ant wall infarct w/ very small amt of peri-infarct ischemia. EF 60%.  . Cellulitis 04/2014   left facial  . Chondromalacia of medial femoral condyle    Left knee MRI 04/28/12: Chondromalacia of the medial femoral condyle with slight peripheral degeneration of the meniscocapsular junction of the medial meniscus; followed by sports medicine  . Collagen vascular disease ([place])   . Crack cocaine use    for 20+ years, has been enrolled in detox programs in the past  . Depression    with history of hospitalization for suicidal ideation  . Diabetes mellitus 2002   Diagnosed in 2002, started insulin in 2012  . Gout   . Gout 04/28/2012  . Headache(784.0)    CT head 08/2011: Periventricular and subcortical white matter hypodensities are most in keeping with chronic microangiopathic change  . HIV infection [facility]) Nov 2012   Followed by [name]  . Hyperlipidemia   . Hypertension   . Pulmonary embolism [facility])         Past Surgical History:  Procedure Laterality Date  . AMPUTATION Right 07/21/2019   Procedure: RIGHT SECOND TOE AMPUTATION; Surgeon: [name], MD; Location: [place]; Service: Orthopedics;  Laterality: Right;  . BACK SURGERY     1988  . BOWEL RESECTION    . CARDIAC SURGERY    .  CERVICAL SPINE SURGERY     " rods in my neck "  . CORONARY ARTERY BYPASS GRAFT    . CORONARY STENT PLACEMENT    . NM MYOCAR PERF WALL MOTION  12/27/2011   normal  . SPINE SURGERY     Family History:       Family History  Problem Relation Age of Onset  . Diabetes Mother   . Hypertension Mother   . Hyperlipidemia Mother   . Diabetes Father   . Cancer Father   . Hypertension Father   . Diabetes Brother   . Heart disease Brother   . Diabetes Sister   . Colon cancer Neg Hx    Family Psychiatric History: Denies  Social History:  Social History       Substance and Sexual Activity  Alcohol Use No  . Alcohol/week: 4.0 standard drinks  . Types: 2 Cans of beer, 2 Shots of liquor per week   Social History       Substance and Sexual Activity  Drug Use Yes  . Frequency: 4.0 times per week  . Types: "Crack" cocaine, Cocaine   Comment: Recent use of crack   Social History        Socioeconomic History  . Marital status: Widowed    Spouse name: Not on file  . Number of children: 2  . Years of education: 62  . Highest education level: 12th grade  Occupational History    Employer: UNEMPLOYED    Comment: 04/2016  Social Needs  . Financial resource strain: Somewhat hard  . Food insecurity    Worry: Often true    Inability: Often true  . Transportation needs    Medical: Yes    Non-medical: Yes  Tobacco Use  . Smoking status: Never Smoker  . Smokeless tobacco: Never Used  Substance and Sexual Activity  . Alcohol use: No    Alcohol/week: 4.0 standard drinks    Types: 2 Cans of beer, 2 Shots of liquor per week  . Drug use: Yes    Frequency: 4.0 times per week    Types: "Crack" cocaine, Cocaine    Comment: Recent use of crack  . Sexual activity: Yes    Comment: accepted condoms  Lifestyle  . Physical activity    Days per week: 0 days    Minutes per session: 0 min  . Stress:  Very much  Relationships  . Social Herbalist on phone: Once a week    Gets together: Never    Attends religious service: Never    Active member of club or organization: No    Attends meetings of clubs or organizations: Never    Relationship status: Widowed  Other Topics Concern  . Not on file  Social History Narrative   Currently staying with a friend in [place]. Was staying @ local motel until a few days ago - left b/c of bed bugs.   [facility]. Not followed by a psychiatrist.   Additional Social History: N/A   Allergies: No Known Allergies  Labs:  Lab Results Last 48 Hours  Medications:           Current Facility-Administered Medications  Medication Dose Route Frequency Provider Last Rate Last Dose  . albuterol (PROVENTIL) (2.5 MG/3ML) 0.083% nebulizer solution 2.5 mg 2.5 mg Inhalation Q6H PRN Ward, Kristen N, DO    . allopurinol (ZYLOPRIM) tablet 100 mg 100 mg Oral Daily Ward, Kristen N, DO    . aspirin chewable tablet 81 mg 81 mg Oral Daily Ward, Kristen N, DO    . diltiazem (CARDIZEM CD) 24 hr capsule 120 mg 120 mg Oral Daily Ward, Kristen N, DO    . emtricitabine-rilpivir-tenofovir AF (ODEFSEY) 200-25-25 MG per tablet 1 tablet 1  tablet Oral Q breakfast Ward, Kristen N, DO    . famotidine (PEPCID) tablet 20 mg 20 mg Oral BID Ward, Kristen N, DO  20 mg at 08/12/19 0021  . gabapentin (NEURONTIN) capsule 400 mg 400 mg Oral BID Ward, Kristen N, DO  400 mg at 08/12/19 0021  . hydrOXYzine (ATARAX/VISTARIL) tablet 25 mg 25 mg Oral TID PRN Ward, Kristen N, DO    . insulin aspart (novoLOG) injection 0-15 Units 0-15 Units Subcutaneous TID WC Ward, Kristen N, DO  5 Units at 08/12/19 0806  . insulin glargine (LANTUS) injection 20 Units 20 Units Subcutaneous Daily Ward, Kristen N, DO    . metFORMIN (GLUCOPHAGE) tablet 1,000 mg 1,000 mg Oral Daily Ward, Kristen N, DO  1,000 mg at 08/12/19 0805  . sertraline (ZOLOFT) tablet 50 mg 50 mg Oral Daily Ward, Kristen N, DO    . sodium chloride flush (NS) 0.9 % injection 3 mL 3 mL Intravenous Once Tegeler, [name], MD    . traZODone (DESYREL) tablet 100 mg 100 mg Oral QHS PRN Ward, Delice Bison, DO  100 mg at 08/12/19 0022         Current Outpatient Medications  Medication Sig Dispense Refill  . acetaminophen (TYLENOL) 325 MG tablet Take 325 mg by mouth every 6 (six) hours as needed for mild pain.    Marland Kitchen albuterol (VENTOLIN HFA) 108 (90 Base) MCG/ACT inhaler Inhale 1-2 puffs into the lungs every 6 (six) hours as needed for wheezing or shortness of breath.    . allopurinol (ZYLOPRIM) 100 MG tablet Take 1 tablet by mouth daily.    Marland Kitchen aspirin 81 MG chewable tablet Chew 1 tablet (81 mg total) by mouth daily. Heart health 30 tablet 0  . diltiazem (CARDIZEM CD) 120 MG 24 hr capsule Take 1 capsule (120 mg total) by mouth daily. For blood pressure 30 capsule 0  . emtricitabine-rilpivir-tenofovir AF (ODEFSEY) 200-25-25 MG TABS tablet Take 1 tablet by mouth daily with breakfast. For HIV infection 30 tablet 0  . famotidine (PEPCID) 20 MG tablet Take 1 tablet (20 mg total) by mouth 2 (two) times daily. For heart burn 60 tablet 0  . HUMALOG 100 UNIT/ML injection Inject 4-6 Units into the skin daily.    .  hydrOXYzine (ATARAX/VISTARIL) 25 MG tablet Take 1 tablet (25 mg total) by mouth 3 (three) times daily as needed for anxiety. 90 tablet 0  . Insulin Glargine (LANTUS) 100 UNIT/ML Solostar Pen Inject 20 Units into the skin daily. 15 mL 0  . metFORMIN (GLUCOPHAGE) 1000 MG tablet Take 1 tablet (1,000 mg total) by mouth daily. 30 tablet 0  . nitroGLYCERIN (NITROSTAT) 0.4 MG SL tablet Place 1 tablet (0.4 mg total) under the tongue every 5 (five) minutes as needed for chest pain.  12  . sertraline (ZOLOFT) 50 MG tablet Take 1  tablet (50 mg total) by mouth daily. For depression 30 tablet 0  . traZODone (DESYREL) 100 MG tablet Take 1 tablet (100 mg total) by mouth at bedtime as needed for sleep. 30 tablet 0  . gabapentin (NEURONTIN) 400 MG capsule Take 1 capsule (400 mg total) by mouth 2 (two) times daily. For agitation/neuropathy 60 capsule 0   Musculoskeletal:  Strength & Muscle Tone: within normal limits  Gait & Station: normal  Patient leans: N/A  Psychiatric Specialty Exam:  Physical Exam  Nursing note and vitals reviewed.  Constitutional: He is oriented to person, place, and time. He appears well-developed.  HENT:  Head: Normocephalic and atraumatic.  Neck: Normal range of motion.  Cardiovascular: Normal rate.  Respiratory: Effort normal.  Neurological: He is alert and oriented to person, place, and time.  Psychiatric: His speech is normal and behavior is normal. Cognition and memory are normal. He expresses impulsivity. He exhibits a depressed mood. He expresses suicidal ideation. He expresses suicidal plans (Plan to overdose on crack cocaine).    Review of Systems  Constitutional: Negative.  HENT: Negative.  Eyes: Negative.  Respiratory: Negative.  Cardiovascular: Negative.  Gastrointestinal: Negative.  Genitourinary: Negative.  Musculoskeletal: Negative.  Skin: Negative.  Neurological: Negative.  Endo/Heme/Allergies: Negative.  Psychiatric/Behavioral: Positive for depression,  substance abuse and suicidal ideas.    Blood pressure 114/66, pulse 80, temperature 98.9 F (37.2 C), temperature source Oral, resp. rate 18, SpO2 100 %.There is no height or weight on file to calculate BMI.  General Appearance: Casual  Eye Contact: Fair  Speech: Clear and Coherent  Volume: Normal  Mood: Depressed  Affect: Congruent  Thought Process: Coherent and Descriptions of Associations: Intact  Orientation: Full (Time, Place, and Person)  Thought Content: Logical  Suicidal Thoughts: Yes. with intent/plan  Homicidal Thoughts: No  Memory: Immediate; Good  Recent; Good  Remote; Good  Judgement: Fair  Insight: Fair  Psychomotor Activity: Normal  Concentration: Concentration: Fair and Attention Span: Fair  Recall: Good  Fund of Knowledge: Good  Language: Good  Akathisia: No  Handed: Right  AIMS (if indicated): N/A  Assets: Desire for Improvement  ADL's: Intact  Cognition: WNL  Sleep: N/A  Treatment Plan Summary:  Daily contact with patient to assess and evaluate symptoms and progress in treatment  Disposition: Recommend psychiatric Inpatient admission when medically cleared.  This service was provided via telemedicine using a 2-way, interactive audio and video technology.  Names of all persons participating in this telemedicine service and their role in this encounter.  Name: [name] Patient  Letitia Libra FNP  J. Mariea Clonts MD  Suella Broad, FNP  08/12/2019 9:25 AM  Patient seen by telemedicine for psychiatric evaluation, chart reviewed and case discussed with the physician extender and developed treatment plan. Reviewed the information documented and agree with the treatment plan.  Sheran Fava  08/12/19 9:25 AM

## 2019-08-12 NOTE — ED Notes (Signed)
Dinner tray has been ordered 

## 2019-08-12 NOTE — ED Notes (Signed)
Patient denies pain and is resting comfortably.  

## 2019-08-12 NOTE — ED Notes (Signed)
Patient cbg was 50.

## 2019-08-12 NOTE — Progress Notes (Signed)
Consult request has been received. CSW attempting to follow up at present time  Ottavio Norem M. Hayato Guaman LCSWA Transitions of Care  Clinical Social Worker  Ph: [phone] 

## 2019-08-12 NOTE — ED Notes (Signed)
Spoke to case worker, confirmed pt came from extended stay d/t homelessness. SW/CW to continue to find resources for pt.

## 2019-08-12 NOTE — ED Notes (Signed)
Dinner tray has been provided and patient is currently eating.

## 2019-08-12 NOTE — ED Notes (Signed)
FSBS- 224

## 2019-08-12 NOTE — ED Notes (Signed)
CBG 81 post dinner, pt provided orange juice.

## 2019-08-12 NOTE — ED Notes (Signed)
Social work into see patient. Social work will review patient's chart and will make recommendations and provide resources for patient for outpatient care.

## 2019-08-12 NOTE — ED Notes (Signed)
CBG results of 81 reported to Annie Main, Therapist, sports.

## 2019-08-12 NOTE — Progress Notes (Signed)
CSW at bedside to address consult as it pertains to homelessness. CSW assess for safety at patients home. Pt reports that he does not feel safe returning back home. Pt goes into detail and explains that it is much drug use where he resides and admits to also abusing substances. Pt states " I can not function in the real world". CSW explores options for treatment with patient.   [facility] with [facility], [facility], and [facility]; all who advise that patient call in the morning to inquire about bed availability.   CSW in contact with pts sister [name] who explains that she can not assist with any discharge plans and is unaware as to where patient has been staying.  TOC team will continue working towards assisting patient with discharge.   [facility] Transitions of Care  Clinical Social Worker  Ph: [phone]

## 2019-08-12 NOTE — Consult Note (Addendum)
Telepsych Consultation   Reason for Consult:  SI Referring Physician:  EDP Location of Patient:  Location of Provider: [place] [facility]  Patient Identification: Michael [name] MRN:  [id number] Principal Diagnosis: <principal problem not specified> Diagnosis:  Active Problems:   * No active hospital problems. *   Total Time spent with patient: 15 minutes  Subjective:   Michael [name] is a 68 y.o. male patient who presents with depression, suicidal thoughts, substance abuse and hallucinations. He is very familiar to our facility. He does endorse some ongoing suicidal thoughts and depressive symptoms, however states these thoughts are worsened by his substance use.   68 year old male returns with recurrent suicidal thoughts and cocaine use. He has numerous similar presentations which are concerning for malingering behavior. He has frequented the local hospitals and ER for substance use and medical problems. With approxiatemly 4 admissions in the past 6 months.  He does endorse ongoing suicidal thoughts related to his cocaine use " This patient has chronic suicidal ideations related to his chronic cocaine abuse, at this time he expresses no interest in quitting but is seeking help for secondary gain and housing due to being homeless. He has been admitted 4x. He does endorse having " some faith and not wanting to go to hell if he were to commit suicide'. Will discharge at this time. Throughout the assessment patient was resting peacefully and did not want to engage with Probation officer.     HPI:  Per Consult Michael [name] is a 68 y.o. male who presented to [facility] on voluntary basis with complaint of suicidal ideation, other depressive symptoms, and recent crack cocaine use.   Michael [name] was last assessed by TTS in April 2020.  At that time, he presented with complaint of similar complaints.  Per history, Michael [name] is known to the ED with three previous psychiatric hospitalizations since  December 2019 [facility], [facility], [place]).  Michael [name] stated that he is homeless, and he was picked up by EMS from an extended stay hotel.  Michael [name] denied any current psychiatric provider.  In April 2020, he was treated by [facility].  Michael [name] came to the ED with complaint of chest pains and expressed suicidal ideation.  Michael [name] reported as follows:  He stated that he has ''never stopped'' being suicidal, and that he has a current plan to overdose (''smoke myself to death'').  Michael [name] also endorsed binge use of cocaine.  He reported using an unknown quantity of crack cocaine over the last four days.  Michael [name] endorsed the following symptoms:  Suicidal ideation with plan to overdose (Michael [name] also endorsed three previous suicide attempts); persistent and unremitting despondency; tearfulness; insomnia; isolation; feelings of worthlessness; fatigue; tearfulness.  Michael [name] also endorsed binge use of crack cocaine as indicated above.  Michael [name] stated that he is experiencing auditory hallucinations -- voices encouraging him to harm himself.  He also reported a history of self-injury (cutting), although he stated he has not done so in over a year.  During assessment, Michael [name] presented as alert and oriented.  He had good eye contact and was cooperative.  He appeared appropriately groomed.  Michael [name]'s mood was depressed, and affect was also depressed.  Michael [name] was tearful.  Michael [name]'s speech was normal in rate, rhythm, and volume.  Thought processes were within normal range, and thought content was logical and goal-oriented.  There was no evidence of delusion.  Michael [name]'s memory and concentration were intact.  Insight, judgment, and impulse control were fair to poor.   Past Psychiatric History:  MDD, GAD, Cocaine Use Disorder  Risk to Self: Suicidal Ideation: Yes-Currently Present Suicidal Intent: No Is patient at risk for suicide?: Yes Suicidal Plan?: Yes-Currently Present Specify Current Suicidal Plan: Overdose Access to Means: Yes Specify Access to Suicidal Means: Street drugs,  specifically crack What has been your use of drugs/alcohol within the last 12 months?: crack cocaine How many times?: 3 Other Self Harm Risks: drug use Triggers for Past Attempts: Unknown Intentional Self Injurious Behavior: Cutting Comment - Self Injurious Behavior: Hx of cutting, but not recently Risk to Others: Homicidal Ideation: No Thoughts of Harm to Others: No Current Homicidal Intent: No Current Homicidal Plan: No Access to Homicidal Means: No History of harm to others?: No Assessment of Violence: None Noted Does patient have access to weapons?: No Criminal Charges Pending?: No Does patient have a court date: No Prior Inpatient Therapy: Prior Inpatient Therapy: Yes Prior Therapy Dates: 2019, 2020 Prior Therapy Facilty/Provider(s): [place], [facility], [place] [facility] Reason for Treatment: Suicidal ideation Prior Outpatient Therapy: Prior Outpatient Therapy: Yes Prior Therapy Dates: 2020 Prior Therapy Facilty/Provider(s): [facility] Reason for Treatment: Depressive symptoms Does patient have an ACCT team?: No Does patient have Intensive In-House Services?  : No Does patient have [facility] services? : No Does patient have P4CC services?: No  Past Medical History:  Past Medical History:  Diagnosis Date  . A-fib ([place])   . Asthma    No PFTs, history of childhood asthma  . CAD (coronary artery disease)    a. 06/2013 STEMI/PCI ([facility]): LAD w/ thrombus (treated with BMS), mid 75%, D2 75%; LCX OM2 75%; RCA small, PDA 95%, PLV 95%;  b. 10/2013 Cath/PCI: ISR w/in LAD (Promus DES x 2), borderline OM2 lesion;  c. 01/2014 MV: Intermediate risk, medium-sized distal ant wall infarct w/ very small amt of peri-infarct ischemia. EF 60%.  . Cellulitis 04/2014   left facial  . Chondromalacia of medial femoral condyle    Left knee MRI 04/28/12: Chondromalacia of the medial femoral condyle with slight peripheral degeneration of the meniscocapsular junction of the medial meniscus; followed by sports medicine  . Collagen  vascular disease ([place])   . Crack cocaine use    for 20+ years, has been enrolled in detox programs in the past  . Depression    with history of hospitalization for suicidal ideation  . Diabetes mellitus 2002   Diagnosed in 2002, started insulin in 2012  . Gout   . Gout 04/28/2012  . Headache(784.0)    CT head 08/2011: Periventricular and subcortical white matter hypodensities are most in keeping with chronic microangiopathic change  . HIV infection [facility]) Nov 2012   Followed by [name]  . Hyperlipidemia   . Hypertension   . Pulmonary embolism [facility])     Past Surgical History:  Procedure Laterality Date  . AMPUTATION Right 07/21/2019   Procedure: RIGHT SECOND TOE AMPUTATION;  Surgeon: [name], MD;  Location: [place];  Service: Orthopedics;  Laterality: Right;  . BACK SURGERY     1988  . BOWEL RESECTION    . CARDIAC SURGERY    . CERVICAL SPINE SURGERY     " rods in my neck "  . CORONARY ARTERY BYPASS GRAFT    . CORONARY STENT PLACEMENT    . NM MYOCAR PERF WALL MOTION  12/27/2011   normal  . SPINE SURGERY     Family History:  Family History  Problem Relation Age of Onset  . Diabetes Mother   . Hypertension Mother   .  Hyperlipidemia Mother   . Diabetes Father   . Cancer Father   . Hypertension Father   . Diabetes Brother   . Heart disease Brother   . Diabetes Sister   . Colon cancer Neg Hx    Family Psychiatric  History: Denies Social History:  Social History   Substance and Sexual Activity  Alcohol Use No  . Alcohol/week: 4.0 standard drinks  . Types: 2 Cans of beer, 2 Shots of liquor per week     Social History   Substance and Sexual Activity  Drug Use Yes  . Frequency: 4.0 times per week  . Types: "Crack" cocaine, Cocaine   Comment: Recent use of crack    Social History   Socioeconomic History  . Marital status: Widowed    Spouse name: Not on file  . Number of children: 2  . Years of education: 1  . Highest education level: 12th grade   Occupational History    Employer: UNEMPLOYED    Comment: 04/2016  Social Needs  . Financial resource strain: Somewhat hard  . Food insecurity    Worry: Often true    Inability: Often true  . Transportation needs    Medical: Yes    Non-medical: Yes  Tobacco Use  . Smoking status: Never Smoker  . Smokeless tobacco: Never Used  Substance and Sexual Activity  . Alcohol use: No    Alcohol/week: 4.0 standard drinks    Types: 2 Cans of beer, 2 Shots of liquor per week  . Drug use: Yes    Frequency: 4.0 times per week    Types: "Crack" cocaine, Cocaine    Comment: Recent use of crack  . Sexual activity: Yes    Comment: accepted condoms  Lifestyle  . Physical activity    Days per week: 0 days    Minutes per session: 0 min  . Stress: Very much  Relationships  . Social Herbalist on phone: Once a week    Gets together: Never    Attends religious service: Never    Active member of club or organization: No    Attends meetings of clubs or organizations: Never    Relationship status: Widowed  Other Topics Concern  . Not on file  Social History Narrative   Currently staying with a friend in [place].  Was staying @ local motel until a few days ago - left b/c of bed bugs.   Picked up from Extended Stay.  Not followed by a psychiatrist.   Additional Social History: N/A    Allergies:  No Known Allergies  Labs:  Results for orders placed or performed during the hospital encounter of 08/11/19 (from the past 48 hour(s))  Basic metabolic panel     Status: Abnormal   Collection Time: 08/11/19  7:07 AM  Result Value Ref Range   Sodium 138 135 - 145 mmol/L   Potassium 3.7 3.5 - 5.1 mmol/L   Chloride 105 98 - 111 mmol/L   CO2 21 (L) 22 - 32 mmol/L   Glucose, Bld 164 (H) 70 - 99 mg/dL   BUN 23 8 - 23 mg/dL   Creatinine, Ser 1.67 (H) 0.61 - 1.24 mg/dL   Calcium 9.3 8.9 - 10.3 mg/dL   GFR calc non Af Amer 41 (L) >60 mL/min   GFR calc Af Amer 48 (L) >60 mL/min   Anion gap 12 5 -  15    Comment: Performed at [facility], [address], [place], [place] [address]  CBC     Status: Abnormal   Collection Time: 08/11/19  7:07 AM  Result Value Ref Range   WBC 3.2 (L) 4.0 - 10.5 K/uL   RBC 3.79 (L) 4.22 - 5.81 MIL/uL   Hemoglobin 9.8 (L) 13.0 - 17.0 g/dL   HCT 31.3 (L) 39.0 - 52.0 %   MCV 82.6 80.0 - 100.0 fL   MCH 25.9 (L) 26.0 - 34.0 pg   MCHC 31.3 30.0 - 36.0 g/dL   RDW 15.2 11.5 - 15.5 %   Platelets 208 150 - 400 K/uL   nRBC 0.0 0.0 - 0.2 %    Comment: Performed at [facility], [place] [address] [place], [place] [address]  Troponin I (High Sensitivity)     Status: None   Collection Time: 08/11/19  7:07 AM  Result Value Ref Range   Troponin I (High Sensitivity) 6 <18 ng/L    Comment: (NOTE) Elevated high sensitivity troponin I (hsTnI) values and significant  changes across serial measurements may suggest ACS but many other  chronic and acute conditions are known to elevate hsTnI results.  Refer to the "Links" section for chest pain algorithms and additional  guidance. Performed at [facility], [place] [address], [place], [place] [address]   D-dimer, quantitative (not at [facility])     Status: None   Collection Time: 08/11/19  7:07 AM  Result Value Ref Range   D-Dimer, Quant 0.47 0.00 - 0.50 ug/mL-FEU    Comment: (NOTE) At the manufacturer cut-off of 0.50 ug/mL FEU, this assay has been documented to exclude PE with a sensitivity and negative predictive value of 97 to 99%.  At this time, this assay has not been approved by the FDA to exclude DVT/VTE. Results should be correlated with clinical presentation. Performed at [facility], [place] [address] [place], [place] [address]   Ethanol     Status: None   Collection Time: 08/11/19 10:27 AM  Result Value Ref Range   Alcohol, Ethyl (B) <10 <10 mg/dL    Comment: (NOTE) Lowest detectable limit for serum alcohol is 10 mg/dL. For medical purposes only. Performed at [facility],  [place] [address], [place], [place] [address]   Hepatic function panel     Status: None   Collection Time: 08/11/19 10:47 AM  Result Value Ref Range   Total Protein 7.5 6.5 - 8.1 g/dL   Albumin 3.6 3.5 - 5.0 g/dL   AST 18 15 - 41 U/L   ALT 17 0 - 44 U/L   Alkaline Phosphatase 52 38 - 126 U/L   Total Bilirubin 0.8 0.3 - 1.2 mg/dL   Bilirubin, Direct <0.1 0.0 - 0.2 mg/dL   Indirect Bilirubin NOT CALCULATED 0.3 - 0.9 mg/dL    Comment: Performed at [facility] [address] [place], [place] [address]  Lipase, blood     Status: Abnormal   Collection Time: 08/11/19 10:47 AM  Result Value Ref Range   Lipase 62 (H) 11 - 51 U/L    Comment: Performed at [facility] [address] [place], [place] [address]  Troponin I (High Sensitivity)     Status: None   Collection Time: 08/11/19 10:47 AM  Result Value Ref Range   Troponin I (High Sensitivity) 6 <18 ng/L    Comment: (NOTE) Elevated high sensitivity troponin I (hsTnI) values and significant  changes across serial measurements may suggest ACS but many  other  chronic and acute conditions are known to elevate hsTnI results.  Refer to the Links section for chest pain algorithms and additional  guidance. Performed at [facility], [place] [address] [place], [place] [address]   SARS Coronavirus 2 by RT PCR (hospital order, performed in [facility] hospital lab) Nasopharyngeal Nasopharyngeal Swab     Status: None   Collection Time: 08/11/19 11:00 AM   Specimen: Nasopharyngeal Swab  Result Value Ref Range   SARS Coronavirus 2 NEGATIVE NEGATIVE    Comment: (NOTE) If result is NEGATIVE SARS-CoV-2 target nucleic acids are NOT DETECTED. The SARS-CoV-2 RNA is generally detectable in upper and lower  respiratory specimens during the acute phase of infection. The lowest  concentration of SARS-CoV-2 viral copies this assay can detect is 250  copies / mL. A negative result does not preclude SARS-CoV-2 infection  and should not be  used as the sole basis for treatment or other  patient management decisions.  A negative result may occur with  improper specimen collection / handling, submission of specimen other  than nasopharyngeal swab, presence of viral mutation(s) within the  areas targeted by this assay, and inadequate number of viral copies  (<250 copies / mL). A negative result must be combined with clinical  observations, patient history, and epidemiological information. If result is POSITIVE SARS-CoV-2 target nucleic acids are DETECTED. The SARS-CoV-2 RNA is generally detectable in upper and lower  respiratory specimens dur ing the acute phase of infection.  Positive  results are indicative of active infection with SARS-CoV-2.  Clinical  correlation with patient history and other diagnostic information is  necessary to determine patient infection status.  Positive results do  not rule out bacterial infection or co-infection with other viruses. If result is PRESUMPTIVE POSTIVE SARS-CoV-2 nucleic acids MAY BE PRESENT.   A presumptive positive result was obtained on the submitted specimen  and confirmed on repeat testing.  While 2019 novel coronavirus  (SARS-CoV-2) nucleic acids may be present in the submitted sample  additional confirmatory testing may be necessary for epidemiological  and / or clinical management purposes  to differentiate between  SARS-CoV-2 and other Sarbecovirus currently known to infect humans.  If clinically indicated additional testing with an alternate test  methodology [phone]) is advised. The SARS-CoV-2 RNA is generally  detectable in upper and lower respiratory sp ecimens during the acute  phase of infection. The expected result is Negative. Fact Sheet for Patients:  StrictlyIdeas.no Fact Sheet for Healthcare Providers: BankingDealers.co.za This test is not yet approved or cleared by the [place] FDA and has been authorized  for detection and/or diagnosis of SARS-CoV-2 by FDA under an Emergency Use Authorization (EUA).  This EUA will remain in effect (meaning this test can be used) for the duration of the COVID-19 declaration under Section 564(b)(1) of the Act, 21 U.S.C. section 360bbb-3(b)(1), unless the authorization is terminated or revoked sooner. Performed at [facility], [place] [address], [place], [place] [address]   Troponin I (High Sensitivity)     Status: None   Collection Time: 08/11/19 12:49 PM  Result Value Ref Range   Troponin I (High Sensitivity) 4 <18 ng/L    Comment: (NOTE) Elevated high sensitivity troponin I (hsTnI) values and significant  changes across serial measurements may suggest ACS but many other  chronic and acute conditions are known to elevate hsTnI results.  Refer to the "Links" section for chest pain algorithms and additional  guidance. Performed at [facility], [place] [address], [name], [place] [address]   Rapid urine drug screen (hospital performed)     Status: Abnormal   Collection Time: 08/11/19 12:51 PM  Result Value Ref Range   Opiates NONE DETECTED NONE DETECTED   Cocaine POSITIVE (A) NONE DETECTED   Benzodiazepines NONE DETECTED NONE DETECTED   Amphetamines NONE DETECTED NONE DETECTED   Tetrahydrocannabinol NONE DETECTED NONE DETECTED   Barbiturates NONE DETECTED NONE DETECTED    Comment: (NOTE) DRUG SCREEN FOR MEDICAL PURPOSES ONLY.  IF CONFIRMATION IS NEEDED FOR ANY PURPOSE, NOTIFY LAB WITHIN 5 DAYS. LOWEST DETECTABLE LIMITS FOR URINE DRUG SCREEN Drug Class                     Cutoff (ng/mL) Amphetamine and metabolites    1000 Barbiturate and metabolites    200 Benzodiazepine                 756 Tricyclics and metabolites     300 Opiates and metabolites        300 Cocaine and metabolites        300 THC                            50 Performed at [facility], [place] [address], [place], [place] [address]   Urinalysis, Routine w reflex  microscopic     Status: Abnormal   Collection Time: 08/11/19 12:51 PM  Result Value Ref Range   Color, Urine YELLOW YELLOW   APPearance CLEAR CLEAR   Specific Gravity, Urine 1.020 1.005 - 1.030   pH 5.0 5.0 - 8.0   Glucose, UA NEGATIVE NEGATIVE mg/dL   Hgb urine dipstick SMALL (A) NEGATIVE   Bilirubin Urine NEGATIVE NEGATIVE   Ketones, ur 20 (A) NEGATIVE mg/dL   Protein, ur >=300 (A) NEGATIVE mg/dL   Nitrite NEGATIVE NEGATIVE   Leukocytes,Ua NEGATIVE NEGATIVE   RBC / HPF 0-5 0 - 5 RBC/hpf   WBC, UA 0-5 0 - 5 WBC/hpf   Bacteria, UA RARE (A) NONE SEEN   Squamous Epithelial / LPF 0-5 0 - 5   Mucus PRESENT    Hyaline Casts, UA PRESENT     Comment: Performed at [facility], [address], [place], [place] [address]  CBG monitoring, ED     Status: Abnormal   Collection Time: 08/11/19  5:41 PM  Result Value Ref Range   Glucose-Capillary 204 (H) 70 - 99 mg/dL  CBG monitoring, ED     Status: Abnormal   Collection Time: 08/12/19  6:59 AM  Result Value Ref Range   Glucose-Capillary 224 (H) 70 - 99 mg/dL    Medications:  Current Facility-Administered Medications  Medication Dose Route Frequency Provider Last Rate Last Dose  . albuterol (PROVENTIL) (2.5 MG/3ML) 0.083% nebulizer solution 2.5 mg  2.5 mg Inhalation Q6H PRN Ward, Kristen N, DO      . allopurinol (ZYLOPRIM) tablet 100 mg  100 mg Oral Daily Ward, Kristen N, DO      . aspirin chewable tablet 81 mg  81 mg Oral Daily Ward, Kristen N, DO      . diltiazem (CARDIZEM CD) 24 hr capsule 120 mg  120 mg Oral Daily Ward, Kristen N, DO      . emtricitabine-rilpivir-tenofovir AF (ODEFSEY) 200-25-25 MG per tablet 1 tablet  1 tablet Oral Q breakfast Ward, Kristen N, DO      . famotidine (  PEPCID) tablet 20 mg  20 mg Oral BID Ward, Kristen N, DO   20 mg at 08/12/19 0021  . gabapentin (NEURONTIN) capsule 400 mg  400 mg Oral BID Ward, Kristen N, DO   400 mg at 08/12/19 0021  . hydrOXYzine (ATARAX/VISTARIL) tablet 25 mg  25 mg Oral TID PRN  Ward, Kristen N, DO      . insulin aspart (novoLOG) injection 0-15 Units  0-15 Units Subcutaneous TID WC Ward, Kristen N, DO   5 Units at 08/12/19 0806  . insulin glargine (LANTUS) injection 20 Units  20 Units Subcutaneous Daily Ward, Kristen N, DO      . metFORMIN (GLUCOPHAGE) tablet 1,000 mg  1,000 mg Oral Daily Ward, Kristen N, DO   1,000 mg at 08/12/19 0805  . sertraline (ZOLOFT) tablet 50 mg  50 mg Oral Daily Ward, Kristen N, DO      . sodium chloride flush (NS) 0.9 % injection 3 mL  3 mL Intravenous Once Tegeler, [name], MD      . traZODone (DESYREL) tablet 100 mg  100 mg Oral QHS PRN Ward, Delice Bison, DO   100 mg at 08/12/19 0022   Current Outpatient Medications  Medication Sig Dispense Refill  . acetaminophen (TYLENOL) 325 MG tablet Take 325 mg by mouth every 6 (six) hours as needed for mild pain.    Marland Kitchen albuterol (VENTOLIN HFA) 108 (90 Base) MCG/ACT inhaler Inhale 1-2 puffs into the lungs every 6 (six) hours as needed for wheezing or shortness of breath.    . allopurinol (ZYLOPRIM) 100 MG tablet Take 1 tablet by mouth daily.    Marland Kitchen aspirin 81 MG chewable tablet Chew 1 tablet (81 mg total) by mouth daily. Heart health 30 tablet 0  . diltiazem (CARDIZEM CD) 120 MG 24 hr capsule Take 1 capsule (120 mg total) by mouth daily. For blood pressure 30 capsule 0  . emtricitabine-rilpivir-tenofovir AF (ODEFSEY) 200-25-25 MG TABS tablet Take 1 tablet by mouth daily with breakfast. For HIV infection 30 tablet 0  . famotidine (PEPCID) 20 MG tablet Take 1 tablet (20 mg total) by mouth 2 (two) times daily. For heart burn 60 tablet 0  . HUMALOG 100 UNIT/ML injection Inject 4-6 Units into the skin daily.    . hydrOXYzine (ATARAX/VISTARIL) 25 MG tablet Take 1 tablet (25 mg total) by mouth 3 (three) times daily as needed for anxiety. 90 tablet 0  . Insulin Glargine (LANTUS) 100 UNIT/ML Solostar Pen Inject 20 Units into the skin daily. 15 mL 0  . metFORMIN (GLUCOPHAGE) 1000 MG tablet Take 1 tablet (1,000  mg total) by mouth daily. 30 tablet 0  . nitroGLYCERIN (NITROSTAT) 0.4 MG SL tablet Place 1 tablet (0.4 mg total) under the tongue every 5 (five) minutes as needed for chest pain.  12  . sertraline (ZOLOFT) 50 MG tablet Take 1 tablet (50 mg total) by mouth daily. For depression 30 tablet 0  . traZODone (DESYREL) 100 MG tablet Take 1 tablet (100 mg total) by mouth at bedtime as needed for sleep. 30 tablet 0  . gabapentin (NEURONTIN) 400 MG capsule Take 1 capsule (400 mg total) by mouth 2 (two) times daily. For agitation/neuropathy 60 capsule 0    Musculoskeletal: Strength & Muscle Tone: within normal limits Gait & Station: normal Patient leans: N/A  Psychiatric Specialty Exam: Physical Exam  Nursing note and vitals reviewed. Constitutional: He is oriented to person, place, and time. He appears well-developed.  HENT:  Head: Normocephalic and atraumatic.  Neck: Normal range of motion.  Cardiovascular: Normal rate.  Respiratory: Effort normal.  Neurological: He is alert and oriented to person, place, and time.  Psychiatric: His speech is normal and behavior is normal. Cognition and memory are normal. He expresses impulsivity. He exhibits a depressed mood. He expresses suicidal ideation. He expresses suicidal plans (Plan to overdose on crack cocaine).    Review of Systems  Constitutional: Negative.   HENT: Negative.   Eyes: Negative.   Respiratory: Negative.   Cardiovascular: Negative.   Gastrointestinal: Negative.   Genitourinary: Negative.   Musculoskeletal: Negative.   Skin: Negative.   Neurological: Negative.   Endo/Heme/Allergies: Negative.   Psychiatric/Behavioral: Positive for depression, substance abuse and suicidal ideas.    Blood pressure 114/66, pulse 80, temperature 98.9 F (37.2 C), temperature source Oral, resp. rate 18, SpO2 100 %.There is no height or weight on file to calculate BMI.  General Appearance: Casual  Eye Contact:  Fair  Speech:  Clear and Coherent   Volume:  Normal  Mood:  Depressed  Affect:  Congruent  Thought Process:  Coherent and Descriptions of Associations: Intact  Orientation:  Full (Time, Place, and Person)  Thought Content:  Logical  Suicidal Thoughts:  Yes.  without intent/plan  Homicidal Thoughts:  No  Memory:  Immediate;   Good Recent;   Good Remote;   Good  Judgement:  Fair  Insight:  Fair  Psychomotor Activity:  Normal  Concentration:  Concentration: Fair and Attention Span: Fair  Recall:  Good  Fund of Knowledge:  Good  Language:  Good  Akathisia:  No  Handed:  Right  AIMS (if indicated):   N/A  Assets:  Desire for Improvement  ADL's:  Intact  Cognition:  WNL  Sleep:   N/A     Treatment Plan Summary: Plan Will discharge with outpatient resources and shelter at this time. Patient with chronic substance abuse, depressive symptoms, and suicidal ideations. he is concerned about housing and will offer resources and place social work consult. he may beenfit from [facility] or [facility] for ongoing substance abuse treatment at this time. He is psych cleared. Patient has some abnormalities with his labs from a medical standpoint, that continue to worsen since his last ER visit. Would recommend taking a second look and further medical evaluation if warranted by EDP or hospitalist.    Disposition: No evidence of imminent risk to self or others at present.   Patient does not meet criteria for psychiatric inpatient admission. Supportive therapy provided about ongoing stressors. Discussed crisis plan, support from social network, calling 911, coming to the Emergency Department, and calling Suicide Hotline.  This service was provided via telemedicine using a 2-way, interactive audio and video technology.  Names of all persons participating in this telemedicine service and their role in this encounter. Name: [name] Patient  Sheran Fava FNP-BC    Suella Broad, FNP 08/12/2019 9:25 AM  Attest to NP  note

## 2019-08-13 ENCOUNTER — Ambulatory Visit: Payer: Self-pay | Admitting: *Deleted

## 2019-08-13 LAB — CBG MONITORING, ED
Glucose-Capillary: 76 mg/dL (ref 70–99)
Glucose-Capillary: 157 mg/dL — ABNORMAL HIGH (ref 70–99)
Glucose-Capillary: 150 mg/dL — ABNORMAL HIGH (ref 70–99)

## 2019-08-13 NOTE — ED Notes (Signed)
Regular breakfast tray ordered.  

## 2019-08-13 NOTE — ED Notes (Signed)
Pt. Took a shower, able to walk from shower room to own room, stand-by. Pt. Expressed that they were cold, gave pt. 2 extra, warm blankets. Pt. Is currently calm, will continue to monitor.

## 2019-08-13 NOTE — ED Notes (Signed)
Took pt.s CBG. CBG was 157, RN notified.

## 2019-08-13 NOTE — ED Notes (Signed)
Pt.s breakfast has arrived. CBG has been checked- pt.s CBG is 76.

## 2019-08-13 NOTE — ED Notes (Signed)
Pt.s lunch has just arrived.

## 2019-08-14 LAB — CBG MONITORING, ED
Glucose-Capillary: 167 mg/dL — ABNORMAL HIGH (ref 70–99)
Glucose-Capillary: 158 mg/dL — ABNORMAL HIGH (ref 70–99)
Glucose-Capillary: 100 mg/dL — ABNORMAL HIGH (ref 70–99)
Glucose-Capillary: 163 mg/dL — ABNORMAL HIGH (ref 70–99)

## 2019-08-14 NOTE — ED Notes (Signed)
Breakfast ordered 

## 2019-08-14 NOTE — ED Notes (Signed)
Lunch ordered 

## 2019-08-15 LAB — CBG MONITORING, ED: Glucose-Capillary: 131 mg/dL — ABNORMAL HIGH (ref 70–99)

## 2019-08-15 NOTE — Discharge Instructions (Signed)
Follow up with resources provided

## 2019-08-15 NOTE — Progress Notes (Signed)
CSW received consult for homelessness. CSW met with patient and assessed for homelessness  concerns. CSW notes patient reports that patient will need to call homelessness resources due to COVID policies and provided patient the information to do so. Patient was reserved to discussion of homelessness. CSW discussed community resources and options for support. CSW noted patient requested placement and CSW informed him he has been behaviorally cleared and is ready for discharge from the emergency department.  Patient accepted resources from CSW and verbalized intent to follow up with inpatient resources.. CSW will continue to follow for any needed discharge supports.

## 2019-08-15 NOTE — ED Notes (Addendum)
Pt voiced understanding of d/c instructions and to follow up as directed. Pt has resources given by SW for homeless shelters and [facility]. ALL belongings - 1 labeled belongings bag and 1 valuables envelope - returned to pt - Pt signed verifying all items present. No signature pad available for pt to sign for d/c papers.

## 2019-08-15 NOTE — ED Notes (Signed)
SW spoke w/pt and gave resources for homeless shelters and [facility]. Pt has been psych cleared since 08/12/2019.

## 2019-08-15 NOTE — ED Notes (Signed)
Breakfast ordered 

## 2019-08-16 ENCOUNTER — Other Ambulatory Visit: Payer: Self-pay | Admitting: Pharmacist

## 2019-08-16 ENCOUNTER — Ambulatory Visit: Payer: Self-pay | Admitting: Pharmacist

## 2019-08-16 NOTE — Patient Outreach (Signed)
[facility]) Care Management  08/16/2019  Michael [name] 12-13-1950 [id number]   Patient was called regarding medication review post discharge. Unfortunately, the phone number listed in his chart was not operational so a HIPAA compliant message could not be left.   Unsuccessful outreach letter will be mailed to the patient. (He receives his mail at his sister's home.)  Plan: Send unsuccessful Economist. Call patient back in 2-3 weeks.  Elayne Guerin, PharmD, [place] Clinical Pharmacist [phone]

## 2019-08-17 ENCOUNTER — Other Ambulatory Visit: Payer: Self-pay | Admitting: *Deleted

## 2019-08-17 ENCOUNTER — Ambulatory Visit: Payer: Medicare Other | Admitting: Infectious Diseases

## 2019-08-17 NOTE — Patient Outreach (Signed)
[facility]) Care Management  08/17/2019  Andron Marrazzo 01/30/1951 [id number]   Outreach attempt #2, unsuccessful.  Notified that member was discharged from ED on 11/8, was not admitted to the hospital.  Per chart, member remains homeless, was provided resources by Education officer, museum.  Call placed for transition of care, recording states member unable to receive calls at this time, unable to leave message.  Unsuccessful outreach letter mailed to [employer] address (sister's address) by [facility] pharmacist on 11/9.  Will follow up within the next 3 business days.  Valente David, [place], MSN [place] [phone]

## 2019-08-18 ENCOUNTER — Encounter (HOSPITAL_COMMUNITY): Payer: Self-pay | Admitting: Emergency Medicine

## 2019-08-18 ENCOUNTER — Other Ambulatory Visit: Payer: Self-pay

## 2019-08-18 ENCOUNTER — Emergency Department (HOSPITAL_COMMUNITY)
Admission: EM | Admit: 2019-08-18 | Discharge: 2019-08-18 | Disposition: A | Discharge status: Home / Self Care | Payer: Medicare Other | Attending: Emergency Medicine | Admitting: Emergency Medicine

## 2019-08-18 ENCOUNTER — Emergency Department (HOSPITAL_COMMUNITY): Payer: Medicare Other

## 2019-08-18 DIAGNOSIS — F1410 Cocaine abuse, uncomplicated: Secondary | ICD-10-CM | POA: Diagnosis not present

## 2019-08-18 DIAGNOSIS — F332 Major depressive disorder, recurrent severe without psychotic features: Secondary | ICD-10-CM | POA: Diagnosis not present

## 2019-08-18 DIAGNOSIS — Z20828 Contact with and (suspected) exposure to other viral communicable diseases: Secondary | ICD-10-CM | POA: Diagnosis not present

## 2019-08-18 DIAGNOSIS — R440 Auditory hallucinations: Secondary | ICD-10-CM | POA: Insufficient documentation

## 2019-08-18 DIAGNOSIS — I10 Essential (primary) hypertension: Secondary | ICD-10-CM | POA: Insufficient documentation

## 2019-08-18 DIAGNOSIS — Z951 Presence of aortocoronary bypass graft: Secondary | ICD-10-CM | POA: Insufficient documentation

## 2019-08-18 DIAGNOSIS — N289 Disorder of kidney and ureter, unspecified: Secondary | ICD-10-CM | POA: Insufficient documentation

## 2019-08-18 DIAGNOSIS — E119 Type 2 diabetes mellitus without complications: Secondary | ICD-10-CM | POA: Diagnosis not present

## 2019-08-18 DIAGNOSIS — D649 Anemia, unspecified: Secondary | ICD-10-CM | POA: Diagnosis not present

## 2019-08-18 DIAGNOSIS — R112 Nausea with vomiting, unspecified: Secondary | ICD-10-CM | POA: Diagnosis not present

## 2019-08-18 DIAGNOSIS — R079 Chest pain, unspecified: Secondary | ICD-10-CM | POA: Insufficient documentation

## 2019-08-18 DIAGNOSIS — R071 Chest pain on breathing: Secondary | ICD-10-CM | POA: Insufficient documentation

## 2019-08-18 DIAGNOSIS — R441 Visual hallucinations: Secondary | ICD-10-CM | POA: Diagnosis not present

## 2019-08-18 DIAGNOSIS — R45851 Suicidal ideations: Secondary | ICD-10-CM | POA: Diagnosis not present

## 2019-08-18 DIAGNOSIS — Z955 Presence of coronary angioplasty implant and graft: Secondary | ICD-10-CM | POA: Insufficient documentation

## 2019-08-18 LAB — COMPREHENSIVE METABOLIC PANEL
ALT: 14 U/L (ref 0–44)
AST: 19 U/L (ref 15–41)
Albumin: 3.8 g/dL (ref 3.5–5.0)
Alkaline Phosphatase: 48 U/L (ref 38–126)
Anion gap: 11 (ref 5–15)
BUN: 28 mg/dL — ABNORMAL HIGH (ref 8–23)
CO2: 22 mmol/L (ref 22–32)
Calcium: 9.4 mg/dL (ref 8.9–10.3)
Chloride: 106 mmol/L (ref 98–111)
Creatinine, Ser: 1.98 mg/dL — ABNORMAL HIGH (ref 0.61–1.24)
GFR calc Af Amer: 39 mL/min — ABNORMAL LOW (ref >60)
GFR calc non Af Amer: 33 mL/min — ABNORMAL LOW (ref >60)
Glucose, Bld: 171 mg/dL — ABNORMAL HIGH (ref 70–99)
Potassium: 4 mmol/L (ref 3.5–5.1)
Sodium: 139 mmol/L (ref 135–145)
Total Bilirubin: 1 mg/dL (ref 0.3–1.2)
Total Protein: 7.6 g/dL (ref 6.5–8.1)

## 2019-08-18 LAB — CBC
HCT: 31.7 % — ABNORMAL LOW (ref 39.0–52.0)
Hemoglobin: 9.7 g/dL — ABNORMAL LOW (ref 13.0–17.0)
MCH: 25.7 pg — ABNORMAL LOW (ref 26.0–34.0)
MCHC: 30.6 g/dL (ref 30.0–36.0)
MCV: 84.1 fL (ref 80.0–100.0)
Platelets: 207 10*3/uL (ref 150–400)
RBC: 3.77 MIL/uL — ABNORMAL LOW (ref 4.22–5.81)
RDW: 15.2 % (ref 11.5–15.5)
WBC: 3.3 10*3/uL — ABNORMAL LOW (ref 4.0–10.5)
nRBC: 0 % (ref 0.0–0.2)

## 2019-08-18 LAB — SALICYLATE LEVEL: Salicylate Lvl: <7 mg/dL (ref 2.8–30.0)

## 2019-08-18 LAB — TROPONIN I (HIGH SENSITIVITY)
Troponin I (High Sensitivity): 5 ng/L (ref <18)
Troponin I (High Sensitivity): 6 ng/L (ref <18)

## 2019-08-18 LAB — ETHANOL: Alcohol, Ethyl (B): <10 mg/dL (ref <10)

## 2019-08-18 LAB — PROTIME-INR
INR: 1 (ref 0.8–1.2)
Prothrombin Time: 13.4 seconds (ref 11.4–15.2)

## 2019-08-18 LAB — SARS CORONAVIRUS 2 (TAT 6-24 HRS): SARS Coronavirus 2: NEGATIVE

## 2019-08-18 LAB — RAPID URINE DRUG SCREEN, HOSP PERFORMED
Amphetamines: NOT DETECTED
Barbiturates: NOT DETECTED
Benzodiazepines: NOT DETECTED
Cocaine: POSITIVE — AB
Opiates: NOT DETECTED
Tetrahydrocannabinol: NOT DETECTED

## 2019-08-18 LAB — ACETAMINOPHEN LEVEL: Acetaminophen (Tylenol), Serum: <10 ug/mL — ABNORMAL LOW (ref 10–30)

## 2019-08-18 MED ORDER — GABAPENTIN 400 MG PO CAPS: 400.0000 mg | ORAL_CAPSULE | Freq: Two times a day (BID) | ORAL | Status: DC

## 2019-08-18 MED ORDER — HYDROXYZINE HCL 25 MG PO TABS: 25.0000 mg | ORAL_TABLET | Freq: Three times a day (TID) | ORAL | Status: DC | PRN

## 2019-08-18 MED ORDER — ACETAMINOPHEN 325 MG PO TABS: 650.0000 mg | ORAL_TABLET | ORAL | Status: DC | PRN

## 2019-08-18 MED ORDER — SERTRALINE HCL 50 MG PO TABS
50.0000 mg | ORAL_TABLET | Freq: Every day | ORAL | Status: DC
  Filled 2019-08-18: qty 1

## 2019-08-18 MED ORDER — ASPIRIN 81 MG PO CHEW: 81.0000 mg | CHEWABLE_TABLET | Freq: Every day | ORAL | Status: DC

## 2019-08-18 MED ORDER — ALLOPURINOL 100 MG PO TABS
100.0000 mg | ORAL_TABLET | Freq: Every day | ORAL | Status: DC
  Filled 2019-08-18: qty 1

## 2019-08-18 MED ORDER — METFORMIN HCL 500 MG PO TABS: 1000.0000 mg | ORAL_TABLET | Freq: Every day | ORAL | Status: DC

## 2019-08-18 MED ORDER — EMTRICITAB-RILPIVIR-TENOFOV AF 200-25-25 MG PO TABS
1.0000 | ORAL_TABLET | Freq: Every day | ORAL | Status: DC
  Administered 2019-08-18: 1 via ORAL
  Filled 2019-08-18: qty 1

## 2019-08-18 MED ORDER — TRAZODONE HCL 50 MG PO TABS: 100.0000 mg | ORAL_TABLET | Freq: Every evening | ORAL | Status: DC | PRN

## 2019-08-18 MED ORDER — ONDANSETRON HCL 4 MG PO TABS: 4.0000 mg | ORAL_TABLET | Freq: Three times a day (TID) | ORAL | Status: DC | PRN

## 2019-08-18 MED ORDER — DILTIAZEM HCL ER COATED BEADS 120 MG PO CP24
120.0000 mg | ORAL_CAPSULE | Freq: Every day | ORAL | Status: DC
  Filled 2019-08-18: qty 1

## 2019-08-18 MED ORDER — ALBUTEROL SULFATE HFA 108 (90 BASE) MCG/ACT IN AERS: 1.0000 | INHALATION_SPRAY | Freq: Four times a day (QID) | RESPIRATORY_TRACT | Status: DC | PRN

## 2019-08-18 MED ORDER — FAMOTIDINE 20 MG PO TABS: 20.0000 mg | ORAL_TABLET | Freq: Two times a day (BID) | ORAL | Status: DC

## 2019-08-18 MED ORDER — NITROGLYCERIN 0.4 MG SL SUBL: 0.4000 mg | SUBLINGUAL_TABLET | SUBLINGUAL | Status: DC | PRN

## 2019-08-18 MED ORDER — ALUM & MAG HYDROXIDE-SIMETH 200-200-20 MG/5ML PO SUSP: 30.0000 mL | Freq: Four times a day (QID) | ORAL | Status: DC | PRN

## 2019-08-18 NOTE — ED Triage Notes (Signed)
Patient arrived with EMS from street (homeless) reports mid chest pain for >1 year with mild SOB , no nausea or diaphoresis , patient received 324 ASA by EMS , patient added suicidal ideation plans to overdose , patient admitted smoking crack this evening .

## 2019-08-18 NOTE — Discharge Planning (Signed)
EDCM met with pt at bedside regarding homeless resources and transportation needs.  Pt states he was turned away from a shelter last night and came to the hospital.  States he will arrive at shelter earlier today.  Pt reminded that [facility] is still free of charge for local bus routes.  No further EDCM needs identified.

## 2019-08-18 NOTE — BH Assessment (Signed)
Tele Assessment Note   Patient Name: Michael [name] MRN: [id number] Referring Physician: ED Physician Location of Patient: [facility] Location of Provider: [place] is an 68 y.o. male. Pt reports SI with no plan. Pt denies HI. Pt reports seeing little men when using crack cocaine. Pt reports a hx of crack cocaine use. Pt reports 1 previous SI attempt. Pt reports numerous physical health concerns. Pt reports multiple hosptializations. Pt denies follow-up with outpatient resources due to transportation issues. Pt states he is currently homeless. Pt states he would like assistance with housing.  Tinnie Gens, NP recommends D/C and SW consult.  Diagnosis:  F33.2 MDD  Past Medical History:  Past Medical History:  Diagnosis Date  . A-fib ([place])   . Asthma    No PFTs, history of childhood asthma  . CAD (coronary artery disease)    a. 06/2013 STEMI/PCI ([facility]): LAD w/ thrombus (treated with BMS), mid 75%, D2 75%; LCX OM2 75%; RCA small, PDA 95%, PLV 95%;  b. 10/2013 Cath/PCI: ISR w/in LAD (Promus DES x 2), borderline OM2 lesion;  c. 01/2014 MV: Intermediate risk, medium-sized distal ant wall infarct w/ very small amt of peri-infarct ischemia. EF 60%.  . Cellulitis 04/2014   left facial  . Chondromalacia of medial femoral condyle    Left knee MRI 04/28/12: Chondromalacia of the medial femoral condyle with slight peripheral degeneration of the meniscocapsular junction of the medial meniscus; followed by sports medicine  . Collagen vascular disease ([place])   . Crack cocaine use    for 20+ years, has been enrolled in detox programs in the past  . Depression    with history of hospitalization for suicidal ideation  . Diabetes mellitus 2002   Diagnosed in 2002, started insulin in 2012  . Gout   . Gout 04/28/2012  . Headache(784.0)    CT head 08/2011: Periventricular and subcortical white matter hypodensities are most in keeping with chronic microangiopathic change   . HIV infection [facility]) Nov 2012   Followed by [name]  . Hyperlipidemia   . Hypertension   . Pulmonary embolism [facility])     Past Surgical History:  Procedure Laterality Date  . AMPUTATION Right 07/21/2019   Procedure: RIGHT SECOND TOE AMPUTATION;  Surgeon: [name], MD;  Location: [place];  Service: Orthopedics;  Laterality: Right;  . BACK SURGERY     1988  . BOWEL RESECTION    . CARDIAC SURGERY    . CERVICAL SPINE SURGERY     " rods in my neck "  . CORONARY ARTERY BYPASS GRAFT    . CORONARY STENT PLACEMENT    . NM MYOCAR PERF WALL MOTION  12/27/2011   normal  . SPINE SURGERY      Family History:  Family History  Problem Relation Age of Onset  . Diabetes Mother   . Hypertension Mother   . Hyperlipidemia Mother   . Diabetes Father   . Cancer Father   . Hypertension Father   . Diabetes Brother   . Heart disease Brother   . Diabetes Sister   . Colon cancer Neg Hx     Social History:  reports that he has never smoked. He has never used smokeless tobacco. He reports current drug use. Frequency: 4.00 times per week. Drugs: "Crack" cocaine and Cocaine. He reports that he does not drink alcohol.  Additional Social History:  Alcohol / Drug Use Pain Medications: please see mar Prescriptions: please see mar Over  the Counter: please see mar History of alcohol / drug use?: Yes Longest period of sobriety (when/how long): unknown Negative Consequences of Use: Financial, Legal, Personal relationships, Work / School Substance #1 Name of Substance 1: crack cocaine 1 - Age of First Use: unknown 1 - Amount (size/oz): unknown 1 - Frequency: daily 1 - Duration: ongoing 1 - Last Use / Amount: 08/17/19  CIWA: CIWA-Ar BP: (!) 152/94 Pulse Rate: 79 COWS:    Allergies: No Known Allergies  Home Medications: (Not in a hospital admission)   OB/GYN Status:  No LMP for male patient.  General Assessment Data Location of Assessment: [facility] ED TTS Assessment: In system Is this a  Tele or Face-to-Face Assessment?: Tele Assessment Is this an Initial Assessment or a Re-assessment for this encounter?: Initial Assessment Patient Accompanied by:: N/A Language Other than English: No Living Arrangements: Homeless/Shelter What gender do you identify as?: Male Marital status: Widowed [place] name: NA Pregnancy Status: No Living Arrangements: Other (Comment) Can pt return to current living arrangement?: Yes Admission Status: Voluntary Is patient capable of signing voluntary admission?: Yes Referral Source: Self/Family/Friend Insurance type: United/Medicare     Crisis Care Plan Living Arrangements: Other (Comment) Legal Guardian: Other:(self) Name of Psychiatrist: NA Name of Therapist: NA  Education Status Is patient currently in school?: No Is the patient employed, unemployed or receiving disability?: Receiving disability income  Risk to self with the past 6 months Suicidal Ideation: Yes-Currently Present Has patient been a risk to self within the past 6 months prior to admission? : Yes Suicidal Intent: No-Not Currently/Within Last 6 Months Has patient had any suicidal intent within the past 6 months prior to admission? : Yes Is patient at risk for suicide?: Yes Suicidal Plan?: No Has patient had any suicidal plan within the past 6 months prior to admission? : Yes Specify Current Suicidal Plan: none at the current time Access to Means: No Specify Access to Suicidal Means: NA What has been your use of drugs/alcohol within the last 12 months?: crack cocaine Previous Attempts/Gestures: Yes How many times?: 1 Other Self Harm Risks: NA Triggers for Past Attempts: None known Intentional Self Injurious Behavior: None Comment - Self Injurious Behavior: NA Family Suicide History: No Recent stressful life event(s): Other (Comment) Persecutory voices/beliefs?: No Depression: Yes Depression Symptoms: Fatigue, Isolating, Tearfulness, Loss of interest in usual  pleasures, Feeling worthless/self pity Substance abuse history and/or treatment for substance abuse?: No Suicide prevention information given to non-admitted patients: Not applicable  Risk to Others within the past 6 months Homicidal Ideation: No Does patient have any lifetime risk of violence toward others beyond the six months prior to admission? : No Thoughts of Harm to Others: No Current Homicidal Intent: No Current Homicidal Plan: No Access to Homicidal Means: No Identified Victim: NA History of harm to others?: No Assessment of Violence: None Noted Violent Behavior Description: NA Does patient have access to weapons?: No Criminal Charges Pending?: No Does patient have a court date: No Is patient on probation?: No  Psychosis Hallucinations: None noted Delusions: None noted  Mental Status Report Appearance/Hygiene: Unremarkable, In scrubs Eye Contact: Fair Motor Activity: Unremarkable Speech: Logical/coherent Level of Consciousness: Alert Mood: Depressed Affect: Depressed Anxiety Level: Minimal Thought Processes: Coherent, Relevant Judgement: Unimpaired Orientation: Person, Place, Time, Situation Obsessive Compulsive Thoughts/Behaviors: None  Cognitive Functioning Concentration: Normal Memory: Recent Intact, Remote Intact Is patient IDD: No Insight: Fair Impulse Control: Fair Appetite: Fair Have you had any weight changes? : No Change Sleep: No Change Total Hours  of Sleep: 8 Vegetative Symptoms: None  ADLScreening [facility] Assessment Services) Patient's cognitive ability adequate to safely complete daily activities?: Yes Patient able to express need for assistance with ADLs?: Yes Independently performs ADLs?: Yes (appropriate for developmental age)  Prior Inpatient Therapy Prior Inpatient Therapy: Yes Prior Therapy Dates: 2019, 2020 Prior Therapy Facilty/Provider(s): [place], [place], [place] Reason for Treatment: Suicidal ideation  Prior Outpatient Therapy Prior  Outpatient Therapy: Yes Prior Therapy Dates: 2020 Prior Therapy Facilty/Provider(s): [facility] Reason for Treatment: Depressive symptoms Does patient have an ACCT team?: No Does patient have Intensive In-House Services?  : No Does patient have [facility] services? : No Does patient have [facility] services?: No  ADL Screening (condition at time of admission) Patient's cognitive ability adequate to safely complete daily activities?: Yes Is the patient deaf or have difficulty hearing?: No Does the patient have difficulty seeing, even when wearing glasses/contacts?: No Does the patient have difficulty concentrating, remembering, or making decisions?: No Patient able to express need for assistance with ADLs?: Yes Does the patient have difficulty dressing or bathing?: No Independently performs ADLs?: Yes (appropriate for developmental age)       Abuse/Neglect Assessment (Assessment to be complete while patient is alone) Abuse/Neglect Assessment Can Be Completed: Yes Physical Abuse: Denies Verbal Abuse: Denies Sexual Abuse: Denies Exploitation of patient/patient's resources: Denies     Regulatory affairs officer (For Healthcare) Does Patient Have a Medical Advance Directive?: No Would patient like information on creating a medical advance directive?: No - Patient declined          Disposition:  Disposition Initial Assessment Completed for this Encounter: Yes  This service was provided via telemedicine using a 2-way, interactive audio and video technology.  Names of all persons participating in this telemedicine service and their role in this encounter. Name: [email or web address] Role: NP  Name: Role:   Name:  Role:   Name:  Role:     Cyndia Bent 08/18/2019 7:54 AM

## 2019-08-18 NOTE — ED Provider Notes (Signed)
Patient is cleared by psychiatry.  Peers support and Education officer, museum consult placed. Homelessness resources provided.   Varney Biles, MD 08/18/19 [phone]

## 2019-08-18 NOTE — ED Notes (Signed)
Pt is sinus arrhythmia on monitor 

## 2019-08-18 NOTE — Discharge Instructions (Addendum)
Substance Abuse Treatment Programs ° °Intensive Outpatient Programs °[facility]     °[address]      °[place], [place]                   °[phone]      ° °[facility] °[address] °[place], [place] °[phone] ° °[facility]     °(Inpatient and outpatient)     °[address]           °[phone]   ° °[facility] °[phone] (Suboxone and Methadone) ° °[address]      °[place], [place] [address]      °[phone]      ° °[address] °[place], [place] °852-3033 ° °[facility] (Outpatient/Inpatient, [facility])    °(insurance only) [phone]      °       °[facility]Groups & Residential) °[place], [place] °[phone] ° °   °[facility]     °[address]     °[place], [place]      °[phone]      ° °[facility] (for caregivers and family) °[address] °[place], [place] °336-299-4655 ° ° ° ° ° °Residential Treatment Programs °[facility]      °[address], [place], [place] [address]  °([phone]      ° °[facility] °[address] [place], [place] [address] °[phone] ° °[facility]        °[phone]      ° °[facility] °1-800-659-3381 ° °[facility]             °[address]                                         °[place], [place]                                                °877-615-2722 or [phone]                              ° °[facility] [place] °[address] °[place], [address] °1.877.941.8954 ° °[facility]    °[address]      °[place], [place]     °[phone]      ° °[facility] °[address] °[place], [place] °336-285-9073 ° °[facility]   °[address]     °[place], [place] [address]     °[phone]      °Admissions: 8am-3pm M-F ° °[facility] °[address] °[place] °[phone] ° °[facility]: Residential Program (90 Days)   °[place], [place]      °[phone] or [phone]    ° °[facility] °[place], [place] °(Walk in Hours over the weekend or by referral) ° °[facility] °[address], [place], [place] [address] °([phone] ° °Crisis Mobile: [facility]:  1-877-626-1772 (for crisis response 24 hours a day) °[facility] Hotline:      1-800-256-2452 °Outpatient Psychiatry and Counseling ° °[facility]: Mobile Crisis   Management 24 hours:  1-877-626-1772 ° °[facility] sliding scale fee and walk in schedule: M-F 8am-12pm/1pm-3pm °[address]  °[place], [place] [address] °[phone] ° °[facility] °[address] °[place], [name] [address] °[phone] ° °[facility] ([facility])- new patient walk-in appointments available Monday - Friday 8am -3pm.          °[address] °[place], [place] [address] °336-676-6840 or crisis line- 336-676-6905 ° °[facility]/ Intensive Outpatient Therapy Program °[address] °[place], [place] [address] °336-832-9804 ° °[facility]                  °Crisis Services      °[phone]      °[address]     °[place], [place] [address]                ° °[facility]   °[facility] °[phone] °[address] °[place], [place] [address] ° ° °[facility]          °[address]  °[place], [name] [address]       °([phone] ° °[facility] °[address] °[place], [place] [address] °336-292-1510 ° °[facility]    °[address]    °[place], [place] [address]     °[phone]      ° °Parish McKinney, MD     °[address]     °[place] [address]     °[phone]     °  °[facility] °[address] °[place] [address] ° °[facility]     °[address]     °[place], [place]      °336-542-2076      ° °[facility] °Shamsher Ahluwalia, MD °[address] °[place], [place] [address] °[phone] ° °[facility]     °[address]     °[place], [place] [address]     °336-274-1237      ° °[facility] °[address] °[place], [place] [address] °336-854-4450 °Resources for Temporary Residential Assistance/Crisis Centers ° °DAY CENTERS °[facility] °M-F 8am-3pm   °[address] [place], [place] [address]   336-332-0824 °Services include: laundry, barbering, support groups, case management, phone  & computer access, showers, AA/NA mtgs, mental health/substance abuse nurse, job skills class, disability information, VA assistance, spiritual classes, etc.  ° °HOMELESS SHELTERS ° °[facility]     °[facility]   °[address], [place]     °[phone]       °       °[facility] (women and children)       °[address] °[place], [place] [address] °336-275-0820 °[email or web address] for application and process °Application Required ° °[facility]   °[address]    °[place] [address]     °[phone]       °             °[facility] °[address] °,  [address] °336.273.5572 °336-235-0363(schedule application appt.) °Application Required ° °[facility] (women only)    °[address]     °[place],  [address]     °336-884-1039      °  Intake starts 6pm daily °Need valid ID, SSC, & Police report °[facility] [place] °[address] °[place], [place] °336-881-5420 °Application Required ° °[facility] (men only)     °[address]      °[place], [place]     °[phone]      ° °[facility] °(Pregnant women only) °[address] °[place], [place] °336-275-0206 ° °[facility]      °[address]      °[place], [place] [address]     °[phone]      °       °[facility] °[address] °[place], [place] °336-723-1848 °90 day commitment/SA/Application process ° °[facility](men only)     °[address]     °[place], [place]     °[phone]       °Check-in at 7pm     °       °[facility] °[address] °[place], [place] [address] °336-248-6684 °Men/Women/Women and Children must be there by 7 pm ° °[facility] °[place], [place] °336-722-8721                ° °

## 2019-08-18 NOTE — ED Provider Notes (Signed)
[facility] EMERGENCY DEPARTMENT Provider Note   CSN: [id number] Arrival date & time: 08/18/19  0151    History   Chief Complaint Chief Complaint  Patient presents with  . Chest Pain  . Suicidal    HPI Michael [name] is a 68 y.o. male.   The history is provided by the patient.  He has history of coronary artery disease, depression, cocaine abuse and comes in complaining of chest pain which is in the midsternal area.  He is a very poor historian, but pain is sharp, and has been present for "quite some time".  Is worse with taking a deep breath and worse with movement.  There is associated dyspnea, nausea, vomiting but no diaphoresis.  He is a cigarette smoker and does admit to cocaine use.  There is also history of diabetes, hypertension, pulmonary embolism, coronary artery bypass surgery.  He is also stating that he is suicidal but is unable to explain how he would commit suicide other than by ongoing drug use.  He is endorsing visual and auditory hallucinations.  He is seeing people who are not there, bugs on the wall and is hearing voices telling him to kill himself.  Past Medical History:  Diagnosis Date  . A-fib ([place])   . Asthma    No PFTs, history of childhood asthma  . CAD (coronary artery disease)    a. 06/2013 STEMI/PCI ([facility]): LAD w/ thrombus (treated with BMS), mid 75%, D2 75%; LCX OM2 75%; RCA small, PDA 95%, PLV 95%;  b. 10/2013 Cath/PCI: ISR w/in LAD (Promus DES x 2), borderline OM2 lesion;  c. 01/2014 MV: Intermediate risk, medium-sized distal ant wall infarct w/ very small amt of peri-infarct ischemia. EF 60%.  . Cellulitis 04/2014   left facial  . Chondromalacia of medial femoral condyle    Left knee MRI 04/28/12: Chondromalacia of the medial femoral condyle with slight peripheral degeneration of the meniscocapsular junction of the medial meniscus; followed by sports medicine  . Collagen vascular disease ([place])   . Crack cocaine use    for 20+  years, has been enrolled in detox programs in the past  . Depression    with history of hospitalization for suicidal ideation  . Diabetes mellitus 2002   Diagnosed in 2002, started insulin in 2012  . Gout   . Gout 04/28/2012  . Headache(784.0)    CT head 08/2011: Periventricular and subcortical white matter hypodensities are most in keeping with chronic microangiopathic change  . HIV infection [facility]) Nov 2012   Followed by [name]  . Hyperlipidemia   . Hypertension   . Pulmonary embolism [facility])     Patient Active Problem List   Diagnosis Date Noted  . Osteomyelitis ([place]) 07/17/2019  . Abscess or cellulitis of toe, right   . Toe pain, right-second   . MDD (major depressive disorder), recurrent episode, severe ([place]) 06/15/2019  . Respiratory distress 04/12/2019  . Pneumonia due to severe acute respiratory syndrome coronavirus 2 (SARS-CoV-2) 04/11/2019  . COVID-19 virus infection 03/19/2019  . Chest pain 10/03/2018  . Malnutrition of moderate degree 09/21/2018  . MDD (major depressive disorder), recurrent episode, moderate ([place])   . Type II diabetes mellitus with renal manifestations ([place]) 05/04/2018  . GERD (gastroesophageal reflux disease) 05/04/2018  . S/P CABG (coronary artery bypass graft)   . Left testicular pain   . Depression 02/20/2018  . Epididymo-orchitis, acute 02/20/2018  . Essential hypertension 02/20/2018  . Precordial chest pain 02/20/2018  . AKI (  acute kidney injury) ([place]) 02/14/2018  . HCAP (healthcare-associated pneumonia) 02/14/2018  . History of pulmonary embolism 07/04/2017  . Acute hyponatremia 05/11/2017  . Hyperglycemia due to type 2 diabetes mellitus ([place]) 05/11/2017  . CHF (congestive heart failure) ([place]) 04/01/2017  . Hepatitis C 12/30/2016  . Chronic ischemic heart disease 11/12/2016  . Paroxysmal A-fib ([place]) 11/12/2016  . Back pain 04/18/2016  . S/P carotid endarterectomy 11/15/2015  . MDD (major depressive disorder), recurrent severe,  without psychosis ([place]) 09/09/2015  . Cocaine-induced mood disorder ([place]) 08/14/2015  . Cocaine abuse with cocaine-induced mood disorder ([place]) 08/14/2015  . Gout 07/10/2015  . Acute renal failure superimposed on stage 3 chronic kidney disease ([place]) 03/06/2015  . Normocytic anemia 03/06/2015  . Chronic kidney disease, stage III (moderate) 03/06/2015  . Hypoglycemia   . Encounter for general adult medical examination with abnormal findings 02/09/2015  . Cocaine use disorder, severe, dependence ([place]) 12/13/2014  . Substance or medication-induced depressive disorder with onset during withdrawal ([place]) 12/13/2014  . Severe recurrent major depressive disorder with psychotic features ([place]) 12/12/2014  . Cervicalgia 06/28/2014  . Lumbar radiculopathy, chronic 06/28/2014  . Asthma, chronic 02/03/2014  . 3-vessel CAD 06/24/2013  . ED (erectile dysfunction) of organic origin 07/07/2012  . Hypertension goal BP (blood pressure) < 140/80 04/29/2012  . Chondromalacia of left knee 03/19/2012  . Hyperlipidemia with target LDL less than 100 02/12/2012  . Fibromyalgia 02/12/2012  . HIV (human immunodeficiency virus infection) ([place]) 08/27/2011  . Uncontrolled type 2 diabetes with neuropathy ([place]) 10/17/2000    Past Surgical History:  Procedure Laterality Date  . AMPUTATION Right 07/21/2019   Procedure: RIGHT SECOND TOE AMPUTATION;  Surgeon: [name], MD;  Location: [facility];  Service: Orthopedics;  Laterality: Right;  . BACK SURGERY     1988  . BOWEL RESECTION    . CARDIAC SURGERY    . CERVICAL SPINE SURGERY     " rods in my neck "  . CORONARY ARTERY BYPASS GRAFT    . CORONARY STENT PLACEMENT    . NM MYOCAR PERF WALL MOTION  12/27/2011   normal  . SPINE SURGERY          Home Medications    Prior to Admission medications   Medication Sig Start Date End Date Taking? Authorizing Provider  acetaminophen (TYLENOL) 325 MG tablet Take 325 mg by mouth every 6 (six) hours as needed for mild  pain.    [provider]  albuterol (VENTOLIN HFA) 108 (90 Base) MCG/ACT inhaler Inhale 1-2 puffs into the lungs every 6 (six) hours as needed for wheezing or shortness of breath. 06/19/19   Mallie Darting, NP  allopurinol (ZYLOPRIM) 100 MG tablet Take 1 tablet by mouth daily. 07/30/19   [provider]  aspirin 81 MG chewable tablet Chew 1 tablet (81 mg total) by mouth daily. Heart health 07/30/19 08/29/19  Elodia Florence., MD  diltiazem (CARDIZEM CD) 120 MG 24 hr capsule Take 1 capsule (120 mg total) by mouth daily. For blood pressure 07/30/19 08/29/19  Elodia Florence., MD  emtricitabine-rilpivir-tenofovir AF (ODEFSEY) 200-25-25 MG TABS tablet Take 1 tablet by mouth daily with breakfast. For HIV infection 07/30/19 08/29/19  Elodia Florence., MD  famotidine (PEPCID) 20 MG tablet Take 1 tablet (20 mg total) by mouth 2 (two) times daily. For heart burn 07/30/19 08/29/19  Elodia Florence., MD  gabapentin (NEURONTIN) 400 MG capsule Take 1 capsule (400 mg total) by mouth 2 (  two) times daily. For agitation/neuropathy 07/30/19 08/29/19  Elodia Florence., MD  HUMALOG 100 UNIT/ML injection Inject 4-6 Units into the skin daily. 07/08/19   [provider]  hydrOXYzine (ATARAX/VISTARIL) 25 MG tablet Take 1 tablet (25 mg total) by mouth 3 (three) times daily as needed for anxiety. 07/30/19 08/29/19  Elodia Florence., MD  Insulin Glargine (LANTUS) 100 UNIT/ML Solostar Pen Inject 20 Units into the skin daily. 07/30/19 08/29/19  Elodia Florence., MD  metFORMIN (GLUCOPHAGE) 1000 MG tablet Take 1 tablet (1,000 mg total) by mouth daily. 07/30/19 08/29/19  Elodia Florence., MD  nitroGLYCERIN (NITROSTAT) 0.4 MG SL tablet Place 1 tablet (0.4 mg total) under the tongue every 5 (five) minutes as needed for chest pain. 06/19/19   Mallie Darting, NP  sertraline (ZOLOFT) 50 MG tablet Take 1 tablet (50 mg total) by mouth daily. For depression 07/30/19  08/29/19  Elodia Florence., MD  traZODone (DESYREL) 100 MG tablet Take 1 tablet (100 mg total) by mouth at bedtime as needed for sleep. 07/30/19 08/29/19  Elodia Florence., MD    Family History Family History  Problem Relation Age of Onset  . Diabetes Mother   . Hypertension Mother   . Hyperlipidemia Mother   . Diabetes Father   . Cancer Father   . Hypertension Father   . Diabetes Brother   . Heart disease Brother   . Diabetes Sister   . Colon cancer Neg Hx     Social History Social History   Tobacco Use  . Smoking status: Never Smoker  . Smokeless tobacco: Never Used  Substance Use Topics  . Alcohol use: No    Alcohol/week: 4.0 standard drinks    Types: 2 Cans of beer, 2 Shots of liquor per week  . Drug use: Yes    Frequency: 4.0 times per week    Types: "Crack" cocaine, Cocaine    Comment: Recent use of crack     Allergies   Patient has no known allergies.   Review of Systems Review of Systems  All other systems reviewed and are negative.    Physical Exam Updated Vital Signs BP (!) 145/89 (BP Location: Left Arm)   Pulse 91   Temp 98.2 F (36.8 C) (Oral)   Resp 16   SpO2 96%   Physical Exam Vitals signs and nursing note reviewed.    68 year old male, resting comfortably and in no acute distress. Vital signs are significant for elevated blood pressure. Oxygen saturation is 96%, which is normal. Head is normocephalic and atraumatic. PERRLA, EOMI. Oropharynx is clear. Neck is nontender and supple without adenopathy or JVD. Back is nontender and there is no CVA tenderness. Lungs are clear without rales, wheezes, or rhonchi. Chest is tender throughout the sternal area.  There is no crepitus. Heart has regular rate and rhythm without murmur. Abdomen is soft, flat, nontender without masses or hepatosplenomegaly and peristalsis is normoactive. Extremities have no cyanosis or edema, full range of motion is present. Skin is warm and dry without  rash. Neurologic: Awake and alert, oriented x3, cranial nerves are intact, there are no motor or sensory deficits. Psychiatric: Depressed affect.  ED Treatments / Results  Labs (all labs ordered are listed, but only abnormal results are displayed) Labs Reviewed  COMPREHENSIVE METABOLIC PANEL - Abnormal; Notable for the following components:      Result Value   Glucose, Bld 171 (*)    BUN 28 (*)  Creatinine, Ser 1.98 (*)    GFR calc non Af Amer 33 (*)    GFR calc Af Amer 39 (*)    All other components within normal limits  ACETAMINOPHEN LEVEL - Abnormal; Notable for the following components:   Acetaminophen (Tylenol), Serum <10 (*)    All other components within normal limits  CBC - Abnormal; Notable for the following components:   WBC 3.3 (*)    RBC 3.77 (*)    Hemoglobin 9.7 (*)    HCT 31.7 (*)    MCH 25.7 (*)    All other components within normal limits  RAPID URINE DRUG SCREEN, HOSP PERFORMED - Abnormal; Notable for the following components:   Cocaine POSITIVE (*)    All other components within normal limits  ETHANOL  SALICYLATE LEVEL  PROTIME-INR  TROPONIN I (HIGH SENSITIVITY)    EKG EKG Interpretation  Date/Time:  Wednesday August 18 2019 02:01:38 EST Ventricular Rate:  92 PR Interval:  130 QRS Duration: 84 QT Interval:  364 QTC Calculation: 450 R Axis:   76 Text Interpretation: Sinus rhythm with Premature atrial complexes Minimal voltage criteria for LVH, may be normal variant ( Sokolow-Lyon ) Borderline ECG When compared with ECG of 08/11/2019, Premature atrial complexes are now present Confirmed by Delora Fuel ([id number]) on 08/18/2019 2:29:18 AM   Radiology Dg Chest 2 View  Result Date: 08/18/2019 CLINICAL DATA:  Chest pain and shortness of breath EXAM: CHEST - 2 VIEW COMPARISON:  08/11/2019 FINDINGS: Cardiac shadows within normal limits. Postsurgical changes are again seen. Coronary stents are seen. The lungs are well aerated bilaterally. No focal  infiltrate or sizable effusion is noted. No acute bony abnormality is seen. Postsurgical changes in the cervical spine are seen. IMPRESSION: No acute abnormality noted. Electronically Signed   By: Inez Catalina M.D.   On: 08/18/2019 02:25    Procedures Procedures   Medications Ordered in ED Medications  alum & mag hydroxide-simeth (MAALOX/MYLANTA) 200-200-20 MG/5ML suspension 30 mL (has no administration in time range)  ondansetron (ZOFRAN) tablet 4 mg (has no administration in time range)  acetaminophen (TYLENOL) tablet 650 mg (has no administration in time range)  allopurinol (ZYLOPRIM) tablet 100 mg (has no administration in time range)  albuterol (VENTOLIN HFA) 108 (90 Base) MCG/ACT inhaler 1-2 puff (has no administration in time range)  aspirin chewable tablet 81 mg (has no administration in time range)  diltiazem (CARDIZEM CD) 24 hr capsule 120 mg (has no administration in time range)  emtricitabine-rilpivir-tenofovir AF (ODEFSEY) 200-25-25 MG per tablet 1 tablet (has no administration in time range)  famotidine (PEPCID) tablet 20 mg (has no administration in time range)  gabapentin (NEURONTIN) capsule 400 mg (has no administration in time range)  hydrOXYzine (ATARAX/VISTARIL) tablet 25 mg (has no administration in time range)  metFORMIN (GLUCOPHAGE) tablet 1,000 mg (has no administration in time range)  traZODone (DESYREL) tablet 100 mg (has no administration in time range)  sertraline (ZOLOFT) tablet 50 mg (has no administration in time range)  nitroGLYCERIN (NITROSTAT) SL tablet 0.4 mg (has no administration in time range)     Initial Impression / Assessment and Plan / ED Course  I have reviewed the triage vital signs and the nursing notes.  Pertinent labs & imaging results that were available during my care of the patient were reviewed by me and considered in my medical decision making (see chart for details).  Chest pain which seems to be related to his prior sternotomy.  ECG  is significant only  for premature atrial contractions.  Chest x-ray shows no acute changes.  Labs show moderate anemia and mild renal insufficiency, both unchanged from baseline.  Aspirin had been given by EMS.  Will ask for TTS consultation once troponin and delta troponin are back.  Old records reviewed, and he was seen in the ED 1 week ago for suicidal ideation, evaluated by TTS and not felt to require inpatient care at that time.  Troponin is normal x2, patient is felt to be medically clear for psychiatric evaluation.  TTS consultation is requested.  Final Clinical Impressions(s) / ED Diagnoses   Final diagnoses:  Nonspecific chest pain  Suicidal ideation  Auditory hallucinations  Visual hallucination  Renal insufficiency  Normocytic anemia    ED Discharge Orders    None       Delora Fuel, MD 95/09/32 [phone]

## 2019-08-19 ENCOUNTER — Other Ambulatory Visit: Payer: Self-pay | Admitting: Pharmacist

## 2019-08-19 ENCOUNTER — Emergency Department (HOSPITAL_COMMUNITY)
Admission: EM | Admit: 2019-08-19 | Discharge: 2019-08-20 | Disposition: A | Discharge status: Home / Self Care | Payer: Medicare Other | Attending: Emergency Medicine | Admitting: Emergency Medicine

## 2019-08-19 ENCOUNTER — Other Ambulatory Visit: Payer: Self-pay | Admitting: *Deleted

## 2019-08-19 ENCOUNTER — Other Ambulatory Visit: Payer: Self-pay

## 2019-08-19 ENCOUNTER — Ambulatory Visit: Payer: Self-pay | Admitting: Pharmacist

## 2019-08-19 ENCOUNTER — Encounter (HOSPITAL_COMMUNITY): Payer: Self-pay | Admitting: Emergency Medicine

## 2019-08-19 DIAGNOSIS — J45909 Unspecified asthma, uncomplicated: Secondary | ICD-10-CM | POA: Diagnosis not present

## 2019-08-19 DIAGNOSIS — I509 Heart failure, unspecified: Secondary | ICD-10-CM | POA: Insufficient documentation

## 2019-08-19 DIAGNOSIS — F1410 Cocaine abuse, uncomplicated: Secondary | ICD-10-CM | POA: Diagnosis not present

## 2019-08-19 DIAGNOSIS — F1414 Cocaine abuse with cocaine-induced mood disorder: Secondary | ICD-10-CM | POA: Diagnosis present

## 2019-08-19 DIAGNOSIS — B20 Human immunodeficiency virus [HIV] disease: Secondary | ICD-10-CM | POA: Diagnosis not present

## 2019-08-19 DIAGNOSIS — N183 Chronic kidney disease, stage 3 unspecified: Secondary | ICD-10-CM | POA: Insufficient documentation

## 2019-08-19 DIAGNOSIS — I110 Hypertensive heart disease with heart failure: Secondary | ICD-10-CM | POA: Diagnosis not present

## 2019-08-19 DIAGNOSIS — E1122 Type 2 diabetes mellitus with diabetic chronic kidney disease: Secondary | ICD-10-CM | POA: Insufficient documentation

## 2019-08-19 DIAGNOSIS — F333 Major depressive disorder, recurrent, severe with psychotic symptoms: Secondary | ICD-10-CM | POA: Insufficient documentation

## 2019-08-19 DIAGNOSIS — I2510 Atherosclerotic heart disease of native coronary artery without angina pectoris: Secondary | ICD-10-CM | POA: Insufficient documentation

## 2019-08-19 DIAGNOSIS — Z951 Presence of aortocoronary bypass graft: Secondary | ICD-10-CM | POA: Insufficient documentation

## 2019-08-19 DIAGNOSIS — I130 Hypertensive heart and chronic kidney disease with heart failure and stage 1 through stage 4 chronic kidney disease, or unspecified chronic kidney disease: Secondary | ICD-10-CM | POA: Insufficient documentation

## 2019-08-19 DIAGNOSIS — R45851 Suicidal ideations: Secondary | ICD-10-CM | POA: Diagnosis not present

## 2019-08-19 NOTE — Patient Outreach (Signed)
[place] [facility]) Care Management  08/19/2019  Michael [name] Dec 27, 1950 [id number]   Outreach attempt #3, unsuccessful.  Noted that member had another visit to the ED yesterday, complaints of chronic chest pain and concerns with homelessness.  He was also positive for cocaine again during this visit.  Per chart, received resources for shelters and discharged.  Call placed to member this morning to follow up, recording stating member is not receiving calls at this time.  Will reach out to member a fourth and final time within the next 3 business days, if remain unsuccessful will close case due to inability to maintain contact.  Valente David, [place], MSN [place] [phone]

## 2019-08-19 NOTE — Progress Notes (Signed)
TTS spoke with Vassie Loll, RN who states she will place cart in the pt's room. TTS calling cart, no answer.  Lind Covert, MSW, LCSW Therapeutic Triage Specialist  [phone]

## 2019-08-19 NOTE — Patient Outreach (Signed)
[place] [facility]) Care Management  08/19/2019  Michael [name] Jan 20, 1951 [id number]   Patient was called to follow up after his recent hospitalization and ED visit. Unfortunately, his phone is not operational.  Patient was sent an unsuccessful outreach letter on 08/16/2019.    Plan: Call patient back in the next 3-5 business days. His case will be closed if he cannot be reached.  Elayne Guerin, PharmD, [place] Clinical Pharmacist [phone]

## 2019-08-19 NOTE — Progress Notes (Signed)
TTS consult ordered. No notes in the system for this pt. Unsure why he is in the ED and why consult is needed. TTS will follow up.  Lind Covert, MSW, LCSW Therapeutic Triage Specialist  [phone]

## 2019-08-20 ENCOUNTER — Encounter (HOSPITAL_COMMUNITY): Payer: Self-pay | Admitting: Emergency Medicine

## 2019-08-20 ENCOUNTER — Emergency Department (HOSPITAL_COMMUNITY)
Admission: EM | Admit: 2019-08-20 | Discharge: 2019-08-21 | Disposition: A | Discharge status: Home / Self Care | Payer: Medicare Other | Source: Home / Self Care | Attending: Emergency Medicine | Admitting: Emergency Medicine

## 2019-08-20 DIAGNOSIS — B20 Human immunodeficiency virus [HIV] disease: Secondary | ICD-10-CM | POA: Insufficient documentation

## 2019-08-20 DIAGNOSIS — Z951 Presence of aortocoronary bypass graft: Secondary | ICD-10-CM | POA: Insufficient documentation

## 2019-08-20 DIAGNOSIS — F1410 Cocaine abuse, uncomplicated: Secondary | ICD-10-CM | POA: Insufficient documentation

## 2019-08-20 DIAGNOSIS — R112 Nausea with vomiting, unspecified: Secondary | ICD-10-CM | POA: Insufficient documentation

## 2019-08-20 DIAGNOSIS — E860 Dehydration: Secondary | ICD-10-CM

## 2019-08-20 DIAGNOSIS — Z794 Long term (current) use of insulin: Secondary | ICD-10-CM | POA: Insufficient documentation

## 2019-08-20 DIAGNOSIS — N183 Chronic kidney disease, stage 3 unspecified: Secondary | ICD-10-CM | POA: Insufficient documentation

## 2019-08-20 DIAGNOSIS — I2510 Atherosclerotic heart disease of native coronary artery without angina pectoris: Secondary | ICD-10-CM | POA: Insufficient documentation

## 2019-08-20 DIAGNOSIS — I509 Heart failure, unspecified: Secondary | ICD-10-CM | POA: Insufficient documentation

## 2019-08-20 DIAGNOSIS — E1122 Type 2 diabetes mellitus with diabetic chronic kidney disease: Secondary | ICD-10-CM | POA: Insufficient documentation

## 2019-08-20 DIAGNOSIS — Z7982 Long term (current) use of aspirin: Secondary | ICD-10-CM | POA: Insufficient documentation

## 2019-08-20 DIAGNOSIS — I130 Hypertensive heart and chronic kidney disease with heart failure and stage 1 through stage 4 chronic kidney disease, or unspecified chronic kidney disease: Secondary | ICD-10-CM | POA: Insufficient documentation

## 2019-08-20 DIAGNOSIS — Z79899 Other long term (current) drug therapy: Secondary | ICD-10-CM | POA: Insufficient documentation

## 2019-08-20 LAB — CBC
HCT: 27.9 % — ABNORMAL LOW (ref 39.0–52.0)
Hemoglobin: 8.6 g/dL — ABNORMAL LOW (ref 13.0–17.0)
MCH: 26.1 pg (ref 26.0–34.0)
MCHC: 30.8 g/dL (ref 30.0–36.0)
MCV: 84.5 fL (ref 80.0–100.0)
Platelets: 167 10*3/uL (ref 150–400)
RBC: 3.3 MIL/uL — ABNORMAL LOW (ref 4.22–5.81)
RDW: 15.6 % — ABNORMAL HIGH (ref 11.5–15.5)
WBC: 4.5 10*3/uL (ref 4.0–10.5)
nRBC: 0 % (ref 0.0–0.2)

## 2019-08-20 LAB — URINALYSIS, ROUTINE W REFLEX MICROSCOPIC
Bacteria, UA: NONE SEEN
Bilirubin Urine: NEGATIVE
Glucose, UA: 50 mg/dL — AB
Hgb urine dipstick: NEGATIVE
Ketones, ur: NEGATIVE mg/dL
Leukocytes,Ua: NEGATIVE
Nitrite: NEGATIVE
Protein, ur: 100 mg/dL — AB
Specific Gravity, Urine: 1.025 (ref 1.005–1.030)
pH: 5 (ref 5.0–8.0)

## 2019-08-20 LAB — CBG MONITORING, ED
Glucose-Capillary: 195 mg/dL — ABNORMAL HIGH (ref 70–99)
Glucose-Capillary: 173 mg/dL — ABNORMAL HIGH (ref 70–99)
Glucose-Capillary: 147 mg/dL — ABNORMAL HIGH (ref 70–99)

## 2019-08-20 MED ORDER — FAMOTIDINE 20 MG PO TABS
20.0000 mg | ORAL_TABLET | Freq: Two times a day (BID) | ORAL | Status: DC
  Administered 2019-08-20 (×2): 20 mg via ORAL
  Filled 2019-08-20 (×2): qty 1

## 2019-08-20 MED ORDER — INSULIN ASPART 100 UNIT/ML ~~LOC~~ SOLN
0.0000 [IU] | Freq: Three times a day (TID) | SUBCUTANEOUS | Status: DC
  Administered 2019-08-20: 12:00:00 2 [IU] via SUBCUTANEOUS
  Administered 2019-08-20: 3 [IU] via SUBCUTANEOUS
  Filled 2019-08-20: qty 0.15

## 2019-08-20 MED ORDER — ONDANSETRON HCL 4 MG/2ML IJ SOLN
4.0000 mg | Freq: Once | INTRAMUSCULAR | Status: AC
  Administered 2019-08-21: 4 mg via INTRAVENOUS
  Filled 2019-08-20: qty 2

## 2019-08-20 MED ORDER — INSULIN ASPART 100 UNIT/ML ~~LOC~~ SOLN
0.0000 [IU] | Freq: Every day | SUBCUTANEOUS | Status: DC
  Filled 2019-08-20: qty 0.05

## 2019-08-20 MED ORDER — SODIUM CHLORIDE 0.9% FLUSH
3.0000 mL | Freq: Once | INTRAVENOUS | Status: AC
  Administered 2019-08-21: 3 mL via INTRAVENOUS

## 2019-08-20 MED ORDER — METFORMIN HCL 500 MG PO TABS
1000.0000 mg | ORAL_TABLET | Freq: Every day | ORAL | Status: DC
  Administered 2019-08-20: 1000 mg via ORAL
  Filled 2019-08-20: qty 2

## 2019-08-20 MED ORDER — SODIUM CHLORIDE 0.9 % IV BOLUS
1000.0000 mL | Freq: Once | INTRAVENOUS | Status: AC
  Administered 2019-08-21: 1000 mL via INTRAVENOUS

## 2019-08-20 MED ORDER — ALUM & MAG HYDROXIDE-SIMETH 200-200-20 MG/5ML PO SUSP: 30.0000 mL | Freq: Four times a day (QID) | ORAL | Status: DC | PRN

## 2019-08-20 MED ORDER — SODIUM CHLORIDE 0.9% FLUSH: 10.0000 mL | INTRAVENOUS | Status: DC | PRN

## 2019-08-20 MED ORDER — TRAZODONE HCL 100 MG PO TABS
100.0000 mg | ORAL_TABLET | Freq: Every evening | ORAL | Status: DC | PRN
  Administered 2019-08-20: 100 mg via ORAL
  Filled 2019-08-20: qty 1

## 2019-08-20 MED ORDER — GABAPENTIN 400 MG PO CAPS
400.0000 mg | ORAL_CAPSULE | Freq: Two times a day (BID) | ORAL | Status: DC
  Administered 2019-08-20 (×2): 400 mg via ORAL
  Filled 2019-08-20 (×2): qty 1

## 2019-08-20 MED ORDER — NICOTINE 21 MG/24HR TD PT24
21.0000 mg | MEDICATED_PATCH | Freq: Every day | TRANSDERMAL | Status: DC
  Administered 2019-08-20: 21 mg via TRANSDERMAL
  Filled 2019-08-20: qty 1

## 2019-08-20 MED ORDER — ASPIRIN 81 MG PO CHEW
81.0000 mg | CHEWABLE_TABLET | Freq: Every day | ORAL | Status: DC
  Administered 2019-08-20: 10:00:00 81 mg via ORAL
  Filled 2019-08-20: qty 1

## 2019-08-20 MED ORDER — ONDANSETRON HCL 4 MG PO TABS: 4.0000 mg | ORAL_TABLET | Freq: Three times a day (TID) | ORAL | Status: DC | PRN

## 2019-08-20 MED ORDER — ALBUTEROL SULFATE HFA 108 (90 BASE) MCG/ACT IN AERS: 1.0000 | INHALATION_SPRAY | Freq: Four times a day (QID) | RESPIRATORY_TRACT | Status: DC | PRN

## 2019-08-20 MED ORDER — ACETAMINOPHEN 325 MG PO TABS
650.0000 mg | ORAL_TABLET | ORAL | Status: DC | PRN
  Administered 2019-08-20: 650 mg via ORAL
  Filled 2019-08-20: qty 2

## 2019-08-20 MED ORDER — ALLOPURINOL 100 MG PO TABS
100.0000 mg | ORAL_TABLET | Freq: Every day | ORAL | Status: DC
  Administered 2019-08-20: 10:00:00 100 mg via ORAL
  Filled 2019-08-20: qty 1

## 2019-08-20 MED ORDER — DILTIAZEM HCL ER COATED BEADS 120 MG PO CP24
120.0000 mg | ORAL_CAPSULE | Freq: Every day | ORAL | Status: DC
  Administered 2019-08-20: 120 mg via ORAL
  Filled 2019-08-20: qty 1

## 2019-08-20 NOTE — Progress Notes (Signed)
CSW spoke with patient at bedside. Patient has been psych cleared and ready for discharge. Patient reports he does not need help obtaining his medication, but reports he is wondering if his medication is doing what it needs to do. CSW recommend patient follow up with his PCP about his medication to be evaluated. Patient reports he needs somewhere to say. Patient was at [facility] two days ago and was given homeless resources. CSW explained that there is no shelters available in the area at this time. Patient asked if he could get in contact with [facility] where he has had treatment before. CSW informed patient that he will have to follow up with [facility] on his own. No other social work needs noted, [place] signing off.   [place], LCSW Transitions of Care Department [facility] ED [phone]

## 2019-08-20 NOTE — ED Notes (Signed)
Called lab for blood drawl  otw

## 2019-08-20 NOTE — BH Assessment (Signed)
Tele Assessment Note   Patient Name: Michael [name] MRN: [id number] Referring Physician: Merrily Pew, MD Location of Patient: [facility] Location of Provider: [place] [facility]  [name] is an 68 y.o. male who presents to the ED volumtarily. Pt was recently assessed by TTS on 08/18/19 and d/c with instructions to follow up with OPT. Pt states he returned to the ED because he continued feeling depressed and helpless after he was d/c. Pt states he is suicidal and has plans to OD on crack cocaine. Pt states he has been trying to OD on cocaine for the past several days. Pt states he is homeless and has been sleeping in a motel but he has run out of money. Pt states he has debilitating pain due to an amputation of his right toe and a triple bypass he got in 2019. Pt states he has constant pain and has no desire or motivation to continue living. Pt states he has attempted suicide in the past by cutting his wrists and trying to step in front of traffic. Pt states "unfortunately they didn't hit me." Pt endorses AH with command. Pt states the voices say "just do it, just kill yourself." Pt states he does not have any collateral contacts or supports for TTS to contact. Pt states his wife passed away 3 years ago and this continues to cause him grief. Pt states he was injured at work in 1984 and received pain medication for several years. Pt states the doctor took him off of pain meds and he got introduced to crack cocaine at age 57. Pt states this has been ruining his life. Pt states he has not been sleeping or eating well for the past several weeks. Pt is unable to contract for safety at this time.  Per Talbot Grumbling, NP pt is recommended for inpt tx. [place] to review for possible admission per [facility]. Triage nurse Dorthy Cooler has been advised.   Diagnosis: MDD, recurrent, severe, w/ psychosis; Cocaine use d/o, severe  Past Medical History:  Past Medical History:  Diagnosis Date  . A-fib  ([place])   . Asthma    No PFTs, history of childhood asthma  . CAD (coronary artery disease)    a. 06/2013 STEMI/PCI ([facility]): LAD w/ thrombus (treated with BMS), mid 75%, D2 75%; LCX OM2 75%; RCA small, PDA 95%, PLV 95%;  b. 10/2013 Cath/PCI: ISR w/in LAD (Promus DES x 2), borderline OM2 lesion;  c. 01/2014 MV: Intermediate risk, medium-sized distal ant wall infarct w/ very small amt of peri-infarct ischemia. EF 60%.  . Cellulitis 04/2014   left facial  . Chondromalacia of medial femoral condyle    Left knee MRI 04/28/12: Chondromalacia of the medial femoral condyle with slight peripheral degeneration of the meniscocapsular junction of the medial meniscus; followed by sports medicine  . Collagen vascular disease ([place])   . Crack cocaine use    for 20+ years, has been enrolled in detox programs in the past  . Depression    with history of hospitalization for suicidal ideation  . Diabetes mellitus 2002   Diagnosed in 2002, started insulin in 2012  . Gout   . Gout 04/28/2012  . Headache(784.0)    CT head 08/2011: Periventricular and subcortical white matter hypodensities are most in keeping with chronic microangiopathic change  . HIV infection [facility]) Nov 2012   Followed by [name]  . Hyperlipidemia   . Hypertension   . Pulmonary embolism [facility])     Past Surgical History:  Procedure Laterality Date  . AMPUTATION Right 07/21/2019   Procedure: RIGHT SECOND TOE AMPUTATION;  Surgeon: [name], MD;  Location: [place];  Service: Orthopedics;  Laterality: Right;  . BACK SURGERY     1988  . BOWEL RESECTION    . CARDIAC SURGERY    . CERVICAL SPINE SURGERY     " rods in my neck "  . CORONARY ARTERY BYPASS GRAFT    . CORONARY STENT PLACEMENT    . NM MYOCAR PERF WALL MOTION  12/27/2011   normal  . SPINE SURGERY      Family History:  Family History  Problem Relation Age of Onset  . Diabetes Mother   . Hypertension Mother   . Hyperlipidemia Mother   . Diabetes Father   . Cancer Father   .  Hypertension Father   . Diabetes Brother   . Heart disease Brother   . Diabetes Sister   . Colon cancer Neg Hx     Social History:  reports that he has never smoked. He has never used smokeless tobacco. He reports current drug use. Frequency: 4.00 times per week. Drugs: "Crack" cocaine and Cocaine. He reports that he does not drink alcohol.  Additional Social History:  Alcohol / Drug Use Pain Medications: See MAR Prescriptions: See MAR Over the Counter: See MAR History of alcohol / drug use?: Yes Negative Consequences of Use: Personal relationships Withdrawal Symptoms: Patient aware of relationship between substance abuse and physical/medical complications Substance #1 Name of Substance 1: Cocaine 1 - Age of First Use: 41 1 - Amount (size/oz): varies 1 - Frequency: daily 1 - Duration: ongoing 1 - Last Use / Amount: 08/17/19  CIWA: CIWA-Ar BP: (!) 140/107 Pulse Rate: 94 COWS:    Allergies: No Known Allergies  Home Medications: (Not in a hospital admission)   OB/GYN Status:  No LMP for male patient.  General Assessment Data Assessment unable to be completed: Yes Reason for not completing assessment: TTS consult ordered. No notes in the system for this pt. Unsure why he is in the ED and why consult is needed. TTS will follow up. Location of Assessment: [facility] ED TTS Assessment: In system Is this a Tele or Face-to-Face Assessment?: Tele Assessment Is this an Initial Assessment or a Re-assessment for this encounter?: Initial Assessment Patient Accompanied by:: N/A Language Other than English: No Living Arrangements: Homeless/Shelter What gender do you identify as?: Male Marital status: Widowed Pregnancy Status: No Living Arrangements: Alone Can pt return to current living arrangement?: Yes Admission Status: Voluntary Is patient capable of signing voluntary admission?: Yes Referral Source: Self/Family/Friend Insurance type: [facility]     Crisis Care Plan Living  Arrangements: Alone Name of Psychiatrist: none Name of Therapist: none  Education Status Is patient currently in school?: No Is the patient employed, unemployed or receiving disability?: Receiving disability income  Risk to self with the past 6 months Suicidal Ideation: Yes-Currently Present Has patient been a risk to self within the past 6 months prior to admission? : Yes Suicidal Intent: Yes-Currently Present Has patient had any suicidal intent within the past 6 months prior to admission? : Yes Is patient at risk for suicide?: Yes Suicidal Plan?: Yes-Currently Present Has patient had any suicidal plan within the past 6 months prior to admission? : Yes Specify Current Suicidal Plan: pt states he is trying to OD on crack cocaine Access to Means: Yes Specify Access to Suicidal Means: pt has access to cocaine What has been your use  of drugs/alcohol within the last 12 months?: cocaine Previous Attempts/Gestures: Yes How many times?: 1 Other Self Harm Risks: hx of depression, psychosis, depression Triggers for Past Attempts: Unpredictable Intentional Self Injurious Behavior: None Family Suicide History: No Recent stressful life event(s): Financial Problems, Other (Comment), Turmoil (Comment), Recent negative physical changes, Loss (Comment)(homeless, substance abuse) Persecutory voices/beliefs?: No Depression: Yes Depression Symptoms: Insomnia, Despondent, Tearfulness, Fatigue, Guilt, Loss of interest in usual pleasures, Feeling worthless/self pity Substance abuse history and/or treatment for substance abuse?: Yes Suicide prevention information given to non-admitted patients: Not applicable  Risk to Others within the past 6 months Homicidal Ideation: No Does patient have any lifetime risk of violence toward others beyond the six months prior to admission? : Yes (comment)(pt states he gets angry and wants to hurt others) Thoughts of Harm to Others: No Current Homicidal Intent:  No Current Homicidal Plan: No Access to Homicidal Means: No History of harm to others?: No Assessment of Violence: None Noted Violent Behavior Description: pt states he gets angry and wants to hurt others Does patient have access to weapons?: No Criminal Charges Pending?: No Does patient have a court date: No Is patient on probation?: No  Psychosis Hallucinations: Auditory, With command Delusions: None noted  Mental Status Report Appearance/Hygiene: Disheveled Eye Contact: Fair Motor Activity: Freedom of movement Speech: Logical/coherent Level of Consciousness: Alert, Crying Mood: Depressed, Anxious, Despair, Helpless, Sad, Sullen Affect: Anxious, Depressed, Sad, Sullen Anxiety Level: Moderate Thought Processes: Relevant, Coherent Judgement: Impaired Orientation: Place, Person, Situation, Time, Appropriate for developmental age Obsessive Compulsive Thoughts/Behaviors: None  Cognitive Functioning Concentration: Normal Memory: Recent Intact, Remote Intact Is patient IDD: No Insight: Poor Impulse Control: Poor Appetite: Poor Have you had any weight changes? : Loss Amount of the weight change? (lbs): 5 lbs Sleep: Decreased Total Hours of Sleep: 2 Vegetative Symptoms: None  ADLScreening [facility] Assessment Services) Patient's cognitive ability adequate to safely complete daily activities?: Yes Patient able to express need for assistance with ADLs?: Yes Independently performs ADLs?: Yes (appropriate for developmental age)  Prior Inpatient Therapy Prior Inpatient Therapy: Yes Prior Therapy Dates: 2020, 2019, and multiple other admissions Prior Therapy Facilty/Provider(s): [facility] Reason for Treatment: Suicidal ideation  Prior Outpatient Therapy Prior Outpatient Therapy: Yes Prior Therapy Dates: 2020 Prior Therapy Facilty/Provider(s): [facility] Reason for Treatment: Depressive symptoms Does patient have an ACCT team?: No Does patient have Intensive  In-House Services?  : No Does patient have [facility] services? : No Does patient have P4CC services?: No  ADL Screening (condition at time of admission) Patient's cognitive ability adequate to safely complete daily activities?: Yes Is the patient deaf or have difficulty hearing?: No Does the patient have difficulty seeing, even when wearing glasses/contacts?: No Does the patient have difficulty concentrating, remembering, or making decisions?: No Patient able to express need for assistance with ADLs?: Yes Does the patient have difficulty dressing or bathing?: No Independently performs ADLs?: Yes (appropriate for developmental age) Does the patient have difficulty walking or climbing stairs?: Yes Weakness of Legs: Right Weakness of Arms/Hands: None  Home Assistive Devices/Equipment Home Assistive Devices/Equipment: Cane (specify quad or straight)    Abuse/Neglect Assessment (Assessment to be complete while patient is alone) Abuse/Neglect Assessment Can Be Completed: Yes Physical Abuse: Denies Verbal Abuse: Denies Sexual Abuse: Denies Exploitation of patient/patient's resources: Denies Self-Neglect: Denies     Regulatory affairs officer (For Healthcare) Does Patient Have a Medical Advance Directive?: No Would patient like information on creating a medical advance directive?: No - Patient declined  Disposition: Per Talbot Grumbling, NP pt is recommended for inpt tx. [place] to review for possible admission per [facility] Triage nurse Dorthy Cooler has been advised.  Disposition Initial Assessment Completed for this Encounter: Yes Disposition of Patient: Admit Type of inpatient treatment program: Adult Patient refused recommended treatment: No  This service was provided via telemedicine using a 2-way, interactive audio and video technology.  Names of all persons participating in this telemedicine service and their role in this encounter. Name: Michael [name] Role: Patient  Name: Lind Covert Role: TTS          Lyanne Co 08/20/2019 12:36 AM

## 2019-08-20 NOTE — ED Triage Notes (Addendum)
Patient here from home with complaints of nausea vomiting that started today. Reports being seen earlier today for SI.

## 2019-08-20 NOTE — Progress Notes (Signed)
Per Talbot Grumbling, NP pt is recommended for inpt tx. [place] to review for possible admission per [facility]. Triage nurse Vassie Loll, RN and EDP Dr. Dayna Barker, MD have been advised.  Lind Covert, MSW, LCSW Therapeutic Triage Specialist  [phone]

## 2019-08-20 NOTE — ED Provider Notes (Signed)
Emergency Department Provider Note   I have reviewed the triage vital signs and the nursing notes.   HISTORY  Chief Complaint Suicidal   HPI Michael [name] is a 68 y.o. male who presents the emergency department today secondary to suicidality.  Patient's been seen a few times in the past for the same but states that his symptoms are worsening.  He does not have a plan at this time but states in the past he has tried to cut his wrist.  He has not tried taking medications today.  He denies any alcohol or drug use recently.  No medical problems.   No other associated or modifying symptoms.    Past Medical History:  Diagnosis Date  . A-fib ([place])   . Asthma    No PFTs, history of childhood asthma  . CAD (coronary artery disease)    a. 06/2013 STEMI/PCI ([facility]): LAD w/ thrombus (treated with BMS), mid 75%, D2 75%; LCX OM2 75%; RCA small, PDA 95%, PLV 95%;  b. 10/2013 Cath/PCI: ISR w/in LAD (Promus DES x 2), borderline OM2 lesion;  c. 01/2014 MV: Intermediate risk, medium-sized distal ant wall infarct w/ very small amt of peri-infarct ischemia. EF 60%.  . Cellulitis 04/2014   left facial  . Chondromalacia of medial femoral condyle    Left knee MRI 04/28/12: Chondromalacia of the medial femoral condyle with slight peripheral degeneration of the meniscocapsular junction of the medial meniscus; followed by sports medicine  . Collagen vascular disease ([place])   . Crack cocaine use    for 20+ years, has been enrolled in detox programs in the past  . Depression    with history of hospitalization for suicidal ideation  . Diabetes mellitus 2002   Diagnosed in 2002, started insulin in 2012  . Gout   . Gout 04/28/2012  . Headache(784.0)    CT head 08/2011: Periventricular and subcortical white matter hypodensities are most in keeping with chronic microangiopathic change  . HIV infection [facility]) Nov 2012   Followed by [name]  . Hyperlipidemia   . Hypertension   . Pulmonary embolism [facility])      Patient Active Problem List   Diagnosis Date Noted  . Osteomyelitis ([place]) 07/17/2019  . Abscess or cellulitis of toe, right   . Toe pain, right-second   . MDD (major depressive disorder), recurrent episode, severe ([place]) 06/15/2019  . Respiratory distress 04/12/2019  . Pneumonia due to severe acute respiratory syndrome coronavirus 2 (SARS-CoV-2) 04/11/2019  . COVID-19 virus infection 03/19/2019  . Chest pain 10/03/2018  . Malnutrition of moderate degree 09/21/2018  . MDD (major depressive disorder), recurrent episode, moderate ([place])   . Type II diabetes mellitus with renal manifestations ([place]) 05/04/2018  . GERD (gastroesophageal reflux disease) 05/04/2018  . S/P CABG (coronary artery bypass graft)   . Left testicular pain   . Depression 02/20/2018  . Epididymo-orchitis, acute 02/20/2018  . Essential hypertension 02/20/2018  . Precordial chest pain 02/20/2018  . AKI (acute kidney injury) ([place]) 02/14/2018  . HCAP (healthcare-associated pneumonia) 02/14/2018  . History of pulmonary embolism 07/04/2017  . Acute hyponatremia 05/11/2017  . Hyperglycemia due to type 2 diabetes mellitus ([place]) 05/11/2017  . CHF (congestive heart failure) ([place]) 04/01/2017  . Hepatitis C 12/30/2016  . Chronic ischemic heart disease 11/12/2016  . Paroxysmal A-fib ([place]) 11/12/2016  . Back pain 04/18/2016  . S/P carotid endarterectomy 11/15/2015  . MDD (major depressive disorder), recurrent severe, without psychosis ([place]) 09/09/2015  . Cocaine-induced mood disorder ([place])  08/14/2015  . Cocaine abuse with cocaine-induced mood disorder ([place]) 08/14/2015  . Gout 07/10/2015  . Acute renal failure superimposed on stage 3 chronic kidney disease ([place]) 03/06/2015  . Normocytic anemia 03/06/2015  . Chronic kidney disease, stage III (moderate) 03/06/2015  . Hypoglycemia   . Encounter for general adult medical examination with abnormal findings 02/09/2015  . Cocaine use disorder, severe, dependence ([place])  12/13/2014  . Substance or medication-induced depressive disorder with onset during withdrawal ([place]) 12/13/2014  . Severe recurrent major depressive disorder with psychotic features ([place]) 12/12/2014  . Cervicalgia 06/28/2014  . Lumbar radiculopathy, chronic 06/28/2014  . Asthma, chronic 02/03/2014  . 3-vessel CAD 06/24/2013  . ED (erectile dysfunction) of organic origin 07/07/2012  . Hypertension goal BP (blood pressure) < 140/80 04/29/2012  . Chondromalacia of left knee 03/19/2012  . Hyperlipidemia with target LDL less than 100 02/12/2012  . Fibromyalgia 02/12/2012  . HIV (human immunodeficiency virus infection) ([place]) 08/27/2011  . Uncontrolled type 2 diabetes with neuropathy ([place]) 10/17/2000    Past Surgical History:  Procedure Laterality Date  . AMPUTATION Right 07/21/2019   Procedure: RIGHT SECOND TOE AMPUTATION;  Surgeon: [name], MD;  Location: [place];  Service: Orthopedics;  Laterality: Right;  . BACK SURGERY     1988  . BOWEL RESECTION    . CARDIAC SURGERY    . CERVICAL SPINE SURGERY     " rods in my neck "  . CORONARY ARTERY BYPASS GRAFT    . CORONARY STENT PLACEMENT    . NM MYOCAR PERF WALL MOTION  12/27/2011   normal  . SPINE SURGERY      Current Outpatient Rx  . Order #: [id number] Class: Historical Med  . Order #: [id number] Class: No Print  . Order #: [id number] Class: Historical Med  . Order #: [id number] Class: Normal  . Order #: [id number] Class: Normal  . Order #: [id number] Class: Normal  . Order #: [id number] Class: Print  . Order #: [id number] Class: Print  . Order #: [id number] Class: Historical Med  . Order #: [id number] Class: Normal  . Order #: [id number] Class: Normal  . Order #: [id number] Class: Normal  . Order #: [id number] Class: No Print  . Order #: [id number] Class: Normal  . Order #: [id number] Class: Print    Allergies Patient has no known allergies.  Family History  Problem Relation Age of Onset  . Diabetes Mother   . Hypertension Mother   .  Hyperlipidemia Mother   . Diabetes Father   . Cancer Father   . Hypertension Father   . Diabetes Brother   . Heart disease Brother   . Diabetes Sister   . Colon cancer Neg Hx     Social History Social History   Tobacco Use  . Smoking status: Never Smoker  . Smokeless tobacco: Never Used  Substance Use Topics  . Alcohol use: No    Alcohol/week: 4.0 standard drinks    Types: 2 Cans of beer, 2 Shots of liquor per week  . Drug use: Yes    Frequency: 4.0 times per week    Types: "Crack" cocaine, Cocaine    Comment: Recent use of crack    Review of Systems  All other systems negative except as documented in the HPI. All pertinent positives and negatives as reviewed in the HPI. ____________________________________________   PHYSICAL EXAM:  VITAL SIGNS: ED Triage Vitals  Enc Vitals Group     BP 08/19/19 2328 (!) 140/107     Pulse Rate 08/19/19 2328 94     Resp 08/19/19  2328 18     Temp 08/19/19 2328 98.6 F (37 C)     Temp Source 08/19/19 2328 Oral     SpO2 08/19/19 2328 98 %     Weight 08/19/19 2329 163 lb (73.9 kg)     Height 08/19/19 2329 5\' 8"  (1.727 m)    Constitutional: Alert and oriented. Well appearing and in no acute distress. Eyes: Conjunctivae are normal. PERRL. EOMI. Head: Atraumatic. Nose: No congestion/rhinnorhea. Mouth/Throat: Mucous membranes are moist.  Oropharynx non-erythematous. Neck: No stridor.  No meningeal signs.   Cardiovascular: Normal rate, regular rhythm. Good peripheral circulation. Grossly normal heart sounds.   Respiratory: Normal respiratory effort.  No retractions. Lungs CTAB. Gastrointestinal: Soft and nontender. No distention.  Musculoskeletal: No lower extremity tenderness nor edema. No gross deformities of extremities. Neurologic:  Normal speech and language. No gross focal neurologic deficits are appreciated.  Skin:  Skin is warm, dry and intact. No rash noted.  ____________________________________________   LABS (all labs  ordered are listed, but only abnormal results are displayed)  Labs Reviewed  CBG MONITORING, ED - Abnormal; Notable for the following components:      Result Value   Glucose-Capillary 195 (*)    All other components within normal limits   ____________________________________________  INITIAL IMPRESSION / ASSESSMENT AND PLAN / ED COURSE  Medically cleared for TTS consultation for SI wo definitive plan.  TTS to seek placement. Home meds ordered.   SSI ordered.   Pertinent labs & imaging results that were available during my care of the patient were reviewed by me and considered in my medical decision making (see chart for details). ____________________________________________  FINAL CLINICAL IMPRESSION(S) / ED DIAGNOSES  Final diagnoses:  Suicidal thoughts    MEDICATIONS GIVEN DURING THIS VISIT:  Medications  ondansetron (ZOFRAN) tablet 4 mg (has no administration in time range)  nicotine (NICODERM CQ - dosed in mg/24 hours) patch 21 mg (has no administration in time range)  alum & mag hydroxide-simeth (MAALOX/MYLANTA) 200-200-20 MG/5ML suspension 30 mL (has no administration in time range)  acetaminophen (TYLENOL) tablet 650 mg (650 mg Oral Given 08/20/19 0636)  albuterol (VENTOLIN HFA) 108 (90 Base) MCG/ACT inhaler 1-2 puff (has no administration in time range)  allopurinol (ZYLOPRIM) tablet 100 mg (has no administration in time range)  aspirin chewable tablet 81 mg (has no administration in time range)  diltiazem (CARDIZEM CD) 24 hr capsule 120 mg (has no administration in time range)  famotidine (PEPCID) tablet 20 mg (20 mg Oral Given 08/20/19 0138)  gabapentin (NEURONTIN) capsule 400 mg (400 mg Oral Given 08/20/19 0139)  metFORMIN (GLUCOPHAGE) tablet 1,000 mg (has no administration in time range)  traZODone (DESYREL) tablet 100 mg (100 mg Oral Given 08/20/19 0636)  insulin aspart (novoLOG) injection 0-15 Units (has no administration in time range)  insulin aspart  (novoLOG) injection 0-5 Units (0 Units Subcutaneous Not Given 08/20/19 0347)     NEW OUTPATIENT MEDICATIONS STARTED DURING THIS VISIT:  New Prescriptions   No medications on file    Note:  This note was prepared with assistance of Dragon voice recognition software. Occasional wrong-word or sound-a-like substitutions may have occurred due to the inherent limitations of voice recognition software.   Merrily Pew, MD 08/20/19 [phone]

## 2019-08-20 NOTE — BH Assessment (Signed)
[place] Assessment Progress Note  Per Letitia Libra, FNP, this pt does not require psychiatric hospitalization at this time.  Pt is to be discharged from [facility] with referral information for area substance abuse treatment providers.  This has been included in pt's discharge instructions.  Otila Kluver also reports that she will be ordering social work and peer support consults for pt.  [place], CSW and Mason Jim, Peer Support Specialist, have been notified, respectively.  Pt's nurse, Tilda Franco, has been notified.  Jalene Mullet, [place] Triage Specialist [phone]

## 2019-08-20 NOTE — ED Provider Notes (Signed)
[facility] DEPT Provider Note   CSN: [id number] Arrival date & time: 08/20/19  9323     History   Chief Complaint Chief Complaint  Patient presents with  . Nausea  . Emesis    HPI Michael [name] is a 68 y.o. male.     The history is provided by the patient and medical records. No language interpreter was used.  Emesis Severity:  Moderate Duration:  1 day Timing:  Constant Quality:  Stomach contents Progression:  Unchanged Chronicity:  New Recent urination:  Normal Relieved by:  Nothing Worsened by:  Nothing Ineffective treatments:  None tried Associated symptoms: no abdominal pain, no chills, no cough, no diarrhea, no fever, no headaches and no URI     Past Medical History:  Diagnosis Date  . A-fib ([place])   . Asthma    No PFTs, history of childhood asthma  . CAD (coronary artery disease)    a. 06/2013 STEMI/PCI ([facility]): LAD w/ thrombus (treated with BMS), mid 75%, D2 75%; LCX OM2 75%; RCA small, PDA 95%, PLV 95%;  b. 10/2013 Cath/PCI: ISR w/in LAD (Promus DES x 2), borderline OM2 lesion;  c. 01/2014 MV: Intermediate risk, medium-sized distal ant wall infarct w/ very small amt of peri-infarct ischemia. EF 60%.  . Cellulitis 04/2014   left facial  . Chondromalacia of medial femoral condyle    Left knee MRI 04/28/12: Chondromalacia of the medial femoral condyle with slight peripheral degeneration of the meniscocapsular junction of the medial meniscus; followed by sports medicine  . Collagen vascular disease ([place])   . Crack cocaine use    for 20+ years, has been enrolled in detox programs in the past  . Depression    with history of hospitalization for suicidal ideation  . Diabetes mellitus 2002   Diagnosed in 2002, started insulin in 2012  . Gout   . Gout 04/28/2012  . Headache(784.0)    CT head 08/2011: Periventricular and subcortical white matter hypodensities are most in keeping with chronic microangiopathic change  . HIV infection  [facility]) Nov 2012   Followed by [name]  . Hyperlipidemia   . Hypertension   . Pulmonary embolism [facility])     Patient Active Problem List   Diagnosis Date Noted  . Osteomyelitis ([place]) 07/17/2019  . Abscess or cellulitis of toe, right   . Toe pain, right-second   . MDD (major depressive disorder), recurrent episode, severe ([place]) 06/15/2019  . Respiratory distress 04/12/2019  . Pneumonia due to severe acute respiratory syndrome coronavirus 2 (SARS-CoV-2) 04/11/2019  . COVID-19 virus infection 03/19/2019  . Chest pain 10/03/2018  . Malnutrition of moderate degree 09/21/2018  . MDD (major depressive disorder), recurrent episode, moderate ([place])   . Type II diabetes mellitus with renal manifestations ([place]) 05/04/2018  . GERD (gastroesophageal reflux disease) 05/04/2018  . S/P CABG (coronary artery bypass graft)   . Left testicular pain   . Depression 02/20/2018  . Epididymo-orchitis, acute 02/20/2018  . Essential hypertension 02/20/2018  . Precordial chest pain 02/20/2018  . AKI (acute kidney injury) ([place]) 02/14/2018  . HCAP (healthcare-associated pneumonia) 02/14/2018  . History of pulmonary embolism 07/04/2017  . Acute hyponatremia 05/11/2017  . Hyperglycemia due to type 2 diabetes mellitus ([place]) 05/11/2017  . CHF (congestive heart failure) ([place]) 04/01/2017  . Hepatitis C 12/30/2016  . Chronic ischemic heart disease 11/12/2016  . Paroxysmal A-fib ([place]) 11/12/2016  . Back pain 04/18/2016  . S/P carotid endarterectomy 11/15/2015  . MDD (major depressive disorder),  recurrent severe, without psychosis ([place]) 09/09/2015  . Cocaine-induced mood disorder ([place]) 08/14/2015  . Cocaine abuse with cocaine-induced mood disorder ([place]) 08/14/2015  . Gout 07/10/2015  . Acute renal failure superimposed on stage 3 chronic kidney disease ([place]) 03/06/2015  . Normocytic anemia 03/06/2015  . Chronic kidney disease, stage III (moderate) 03/06/2015  . Hypoglycemia   . Encounter for general adult  medical examination with abnormal findings 02/09/2015  . Cocaine use disorder, severe, dependence ([place]) 12/13/2014  . Substance or medication-induced depressive disorder with onset during withdrawal ([place]) 12/13/2014  . Severe recurrent major depressive disorder with psychotic features ([place]) 12/12/2014  . Cervicalgia 06/28/2014  . Lumbar radiculopathy, chronic 06/28/2014  . Asthma, chronic 02/03/2014  . 3-vessel CAD 06/24/2013  . ED (erectile dysfunction) of organic origin 07/07/2012  . Hypertension goal BP (blood pressure) < 140/80 04/29/2012  . Chondromalacia of left knee 03/19/2012  . Hyperlipidemia with target LDL less than 100 02/12/2012  . Fibromyalgia 02/12/2012  . HIV (human immunodeficiency virus infection) ([place]) 08/27/2011  . Uncontrolled type 2 diabetes with neuropathy ([place]) 10/17/2000    Past Surgical History:  Procedure Laterality Date  . AMPUTATION Right 07/21/2019   Procedure: RIGHT SECOND TOE AMPUTATION;  Surgeon: [name], MD;  Location: [facility];  Service: Orthopedics;  Laterality: Right;  . BACK SURGERY     1988  . BOWEL RESECTION    . CARDIAC SURGERY    . CERVICAL SPINE SURGERY     " rods in my neck "  . CORONARY ARTERY BYPASS GRAFT    . CORONARY STENT PLACEMENT    . NM MYOCAR PERF WALL MOTION  12/27/2011   normal  . SPINE SURGERY          Home Medications    Prior to Admission medications   Medication Sig Start Date End Date Taking? Authorizing Provider  acetaminophen (TYLENOL) 325 MG tablet Take 325 mg by mouth every 6 (six) hours as needed for mild pain.    [provider]  albuterol (VENTOLIN HFA) 108 (90 Base) MCG/ACT inhaler Inhale 1-2 puffs into the lungs every 6 (six) hours as needed for wheezing or shortness of breath. 06/19/19   Mallie Darting, NP  allopurinol (ZYLOPRIM) 100 MG tablet Take 1 tablet by mouth daily. 07/30/19   [provider]  aspirin 81 MG chewable tablet Chew 1 tablet (81 mg total) by mouth daily. Heart  health 07/30/19 08/29/19  Elodia Florence., MD  diltiazem (CARDIZEM CD) 120 MG 24 hr capsule Take 1 capsule (120 mg total) by mouth daily. For blood pressure 07/30/19 08/29/19  Elodia Florence., MD  emtricitabine-rilpivir-tenofovir AF (ODEFSEY) 200-25-25 MG TABS tablet Take 1 tablet by mouth daily with breakfast. For HIV infection 07/30/19 08/29/19  Elodia Florence., MD  famotidine (PEPCID) 20 MG tablet Take 1 tablet (20 mg total) by mouth 2 (two) times daily. For heart burn 07/30/19 08/29/19  Elodia Florence., MD  gabapentin (NEURONTIN) 400 MG capsule Take 1 capsule (400 mg total) by mouth 2 (two) times daily. For agitation/neuropathy 07/30/19 08/29/19  Elodia Florence., MD  HUMALOG 100 UNIT/ML injection Inject 4-6 Units into the skin daily. 07/08/19   [provider]  hydrOXYzine (ATARAX/VISTARIL) 25 MG tablet Take 1 tablet (25 mg total) by mouth 3 (three) times daily as needed for anxiety. 07/30/19 08/29/19  Elodia Florence., MD  Insulin Glargine (LANTUS) 100 UNIT/ML Solostar Pen Inject 20 Units into the skin daily. 07/30/19  08/29/19  Elodia Florence., MD  metFORMIN (GLUCOPHAGE) 1000 MG tablet Take 1 tablet (1,000 mg total) by mouth daily. 07/30/19 08/29/19  Elodia Florence., MD  nitroGLYCERIN (NITROSTAT) 0.4 MG SL tablet Place 1 tablet (0.4 mg total) under the tongue every 5 (five) minutes as needed for chest pain. 06/19/19   Mallie Darting, NP  sertraline (ZOLOFT) 50 MG tablet Take 1 tablet (50 mg total) by mouth daily. For depression 07/30/19 08/29/19  Elodia Florence., MD  traZODone (DESYREL) 100 MG tablet Take 1 tablet (100 mg total) by mouth at bedtime as needed for sleep. 07/30/19 08/29/19  Elodia Florence., MD    Family History Family History  Problem Relation Age of Onset  . Diabetes Mother   . Hypertension Mother   . Hyperlipidemia Mother   . Diabetes Father   . Cancer Father   . Hypertension Father   . Diabetes  Brother   . Heart disease Brother   . Diabetes Sister   . Colon cancer Neg Hx     Social History Social History   Tobacco Use  . Smoking status: Never Smoker  . Smokeless tobacco: Never Used  Substance Use Topics  . Alcohol use: No    Alcohol/week: 4.0 standard drinks    Types: 2 Cans of beer, 2 Shots of liquor per week  . Drug use: Yes    Frequency: 4.0 times per week    Types: "Crack" cocaine, Cocaine    Comment: Recent use of crack     Allergies   Patient has no known allergies.   Review of Systems Review of Systems  Constitutional: Positive for fatigue. Negative for chills, diaphoresis and fever.  HENT: Negative for congestion.   Eyes: Negative for visual disturbance.  Respiratory: Negative for cough, chest tightness, shortness of breath and wheezing.   Gastrointestinal: Positive for nausea and vomiting. Negative for abdominal pain, constipation and diarrhea.  Genitourinary: Negative for flank pain.  Musculoskeletal: Negative for back pain and neck pain.  Neurological: Positive for light-headedness. Negative for dizziness, syncope and headaches.  All other systems reviewed and are negative.    Physical Exam Updated Vital Signs BP 128/86 (BP Location: Right Arm)   Pulse 72   Temp 98.7 F (37.1 C) (Oral)   Resp 19   SpO2 100%   Physical Exam Vitals signs and nursing note reviewed.  Constitutional:      General: He is not in acute distress.    Appearance: He is well-developed. He is not ill-appearing, toxic-appearing or diaphoretic.  HENT:     Head: Normocephalic and atraumatic.     Nose: Nose normal. No congestion or rhinorrhea.     Mouth/Throat:     Mouth: Mucous membranes are moist.     Pharynx: No oropharyngeal exudate or posterior oropharyngeal erythema.  Eyes:     Conjunctiva/sclera: Conjunctivae normal.     Pupils: Pupils are equal, round, and reactive to light.  Neck:     Musculoskeletal: Neck supple. No muscular tenderness.  Cardiovascular:      Rate and Rhythm: Normal rate and regular rhythm.     Pulses: Normal pulses.     Heart sounds: No murmur.  Pulmonary:     Effort: Pulmonary effort is normal. No respiratory distress.     Breath sounds: Normal breath sounds. No wheezing, rhonchi or rales.  Chest:     Chest wall: No tenderness.  Abdominal:     Palpations: Abdomen is soft.  Tenderness: There is no abdominal tenderness. There is no right CVA tenderness, left CVA tenderness, guarding or rebound.  Musculoskeletal:        General: No tenderness.  Skin:    General: Skin is warm and dry.     Capillary Refill: Capillary refill takes less than 2 seconds.  Neurological:     Mental Status: He is alert and oriented to person, place, and time.  Psychiatric:        Mood and Affect: Mood normal.      ED Treatments / Results  Labs (all labs ordered are listed, but only abnormal results are displayed) Labs Reviewed  COMPREHENSIVE METABOLIC PANEL - Abnormal; Notable for the following components:      Result Value   Glucose, Bld 169 (*)    BUN 28 (*)    Creatinine, Ser 1.72 (*)    Calcium 8.2 (*)    GFR calc non Af Amer 40 (*)    GFR calc Af Amer 46 (*)    All other components within normal limits  CBC - Abnormal; Notable for the following components:   RBC 3.30 (*)    Hemoglobin 8.6 (*)    HCT 27.9 (*)    RDW 15.6 (*)    All other components within normal limits  URINALYSIS, ROUTINE W REFLEX MICROSCOPIC - Abnormal; Notable for the following components:   APPearance HAZY (*)    Glucose, UA 50 (*)    Protein, ur 100 (*)    All other components within normal limits  LIPASE, BLOOD    EKG None  Radiology No results found.  Procedures Procedures (including critical care time)  Medications Ordered in ED Medications  sodium chloride flush (NS) 0.9 % injection 10-40 mL (has no administration in time range)  sodium chloride flush (NS) 0.9 % injection 3 mL (3 mLs Intravenous Given 08/21/19 0027)  ondansetron  (ZOFRAN) injection 4 mg (4 mg Intravenous Given 08/21/19 0027)  sodium chloride 0.9 % bolus 1,000 mL (0 mLs Intravenous Paused 08/21/19 0053)     Initial Impression / Assessment and Plan / ED Course  I have reviewed the triage vital signs and the nursing notes.  Pertinent labs & imaging results that were available during my care of the patient were reviewed by me and considered in my medical decision making (see chart for details).        Michael [name] is a 68 y.o. male with a past medical history significant for HIV, CAD, polysubstance abuse, prior CABG, prior pulmonary blizzard, atrial fibrillation, hyperlipidemia, fibromyalgia, and depression with psychotic features who presents with nausea and vomiting.  Patient reports that he has been seen numerous times over the last few weeks in the emergency department for a variety of complaints.  I personally saw the patient several days ago for chest pain and suicidal ideation.  During that time, he was ruled out with negative troponins and a D-dimer so there was no DVT, PE, or cardiac etiology of his symptoms.  He then was complaining of suicidal ideation and we talked to TTS who was able to send him on the next day.  Patient says that he is not having those complaints at this time and is instead having nausea and vomiting.  He reports that he went to the bank today and started vomiting.  He denies pain including no chest pain or abdominal pain.  He reports he has not had any eat or drink because he had all the vomiting.  He  denies any other sick contacts or complaints.  He denies any urinary symptoms constipation, or diarrhea.  He reports after all the vomiting and the pain he got lightheaded and told he needed to lay down.  He did not syncopized or have any chest pain/palpitations or shortness of breath symptoms.  On exam, lungs are clear chest is nontender.  Abdomen is nontender.  Patient has no focal neurologic deficits on initial exam.   Patient resting comfortably but is reporting continued nausea.  Clinically I suspect patient may have a viral gastroenteritis or other etiology of his nausea vomiting.  Since he has had so many episodes of vomiting today, we will give him fluids, nausea medicine, and will check some screening labs.  If work-up is reassuring, anticipate discharge home after p.o. challenge with nausea medication.   1:20 AM Patient's labs have returned overall reassuring.  Creatinine is improved from prior.   suspect mild hydration from his nausea and vomiting.  Lipase is not elevated.  Patient was feeling better after the nausea medicine.  After his fluids have completed, he will be p.o. challenged.  Anticipate discharge home with prescription for nausea medication after his fluids are completed.  Patient instructed to follow-up with his PCP for outpatient management.    Final Clinical Impressions(s) / ED Diagnoses   Final diagnoses:  Non-intractable vomiting with nausea, unspecified vomiting type  Dehydration     Clinical Impression: 1. Non-intractable vomiting with nausea, unspecified vomiting type   2. Dehydration     Disposition: Discharge  Condition: Good  I have discussed the results, Dx and Tx plan with the pt(& family if present). He/she/they expressed understanding and agree(s) with the plan. Discharge instructions discussed at great length. Strict return precautions discussed and pt &/or family have verbalized understanding of the instructions. No further questions at time of discharge.    New Prescriptions   ONDANSETRON (ZOFRAN) 4 MG TABLET    Take 1 tablet (4 mg total) by mouth every 8 (eight) hours as needed for nausea or vomiting.    Follow Up: [name], PA-C [facility] [place] [address] DEPT [place] [id number] mc [place]       [place],  [name], MD 08/21/19 [phone]

## 2019-08-20 NOTE — ED Notes (Signed)
This nurse attempted IV and blood draw, unsuccessful. Deferred for assistance.

## 2019-08-20 NOTE — Patient Outreach (Signed)
CPSS met with the patient in order to provide substance use recovery support and provide information for substance use recovery resources. Patient reports a history of crack cocaine use. Patient's UDS positive for cocaine. Patient is interested in residential substance use treatment. CPSS talked to the patient about available residential substance use treatment options as well as provided the patient with a residential substance use treatment center list. Patient went to residential substance use treatment at [facility] back on 06/19/19. CPSS also provided the patient with information for several different substance use recovery resources. Some of these resources include an outpatient substance use treatment center list, NA meeting list, flier for [facility] dual diagnosis treatment services, and CPSS contact information. CPSS strongly encouraged the patient to follow up with CPSS if needed for further help with [facility]PSS substance use recovery outreach services.  

## 2019-08-20 NOTE — Discharge Instructions (Signed)
To help you maintain a sober lifestyle, a substance abuse treatment program may be beneficial to you.  Contact one of the following facilities at your earliest opportunity to ask about enrolling:  RESIDENTIAL PROGRAMS:       [facility]      [place], [place] [address]      [phone]       [facility]      [address] [place], [place] [address]      [phone]       [facility]      [place], [place] [address]      [phone]  OUTPATIENT PROGRAMS:       [facility]      [address]      [place],  [address]       [phone]

## 2019-08-20 NOTE — ED Notes (Signed)
Unable to collect blood lab specimens. RN advised. [employer]

## 2019-08-20 NOTE — ED Notes (Signed)
Pt provided labeled specimen cup and culture for U/A when possible. [employer]

## 2019-08-20 NOTE — [facility] Suicide Risk Assessment (Cosign Needed)
Suicide Risk Assessment  Discharge Assessment   [facility] Discharge Suicide Risk Assessment   Principal Problem: Cocaine abuse with cocaine-induced mood disorder [facility]) Discharge Diagnoses: Principal Problem:   Cocaine abuse with cocaine-induced mood disorder ([place])   Total Time spent with patient: 30 minutes  Musculoskeletal: Strength & Muscle Tone: within normal limits Gait & Station: normal Patient leans: N/A  Psychiatric Specialty Exam:   Blood pressure 115/75, pulse 89, temperature 98.4 F (36.9 C), temperature source Oral, resp. rate 16, height 5\' 8"  (1.727 m), weight 73.9 kg, SpO2 95 %.Body mass index is 24.78 kg/m.  General Appearance: Casual  Eye Contact::  Good  Speech:  Clear and Coherent and Normal Rate409  Volume:  Normal  Mood:  Depressed  Affect:  Appropriate  Thought Process:  Coherent, Goal Directed and Descriptions of Associations: Intact  Orientation:  Full (Time, Place, and Person)  Thought Content:  WDL and Logical  Suicidal Thoughts:  No  Homicidal Thoughts:  No  Memory:  Immediate;   Good  Judgement:  Fair  Insight:  Fair  Psychomotor Activity:  Normal  Concentration:  Good  Recall:  Good  Fund of Knowledge:Good  Language: Good  Akathisia:  No  Handed:  Right  AIMS (if indicated):     Assets:  Communication Skills Desire for Improvement Financial Resources/Insurance Social Support  Sleep:     Cognition: WNL  ADL's:  Intact   Mental Status Per Nursing Assessment::   On Admission:   Patient denies suicidal and homicidal ideations today. Patient denies access to weapons. Patient denies hallucinations. Patient verbalizes "I am homeless so I am stressed out." Patient reports "I would like to be in a home or somewhere they can help me with things, like my medications." Patient reports spending his check on cocaine, plans to stop use of cocaine moving forward.  Patient states "I had been living in a hotel but my money ran out on me." Patient plans to stay  at shelter for now.  Demographic Factors:  Male and Age 69 or older  Loss Factors: NA  Historical Factors: NA  Risk Reduction Factors:   Positive social support, Positive therapeutic relationship and Positive coping skills or problem solving skills  Continued Clinical Symptoms:  Alcohol/Substance Abuse/Dependencies  Cognitive Features That Contribute To Risk:  None    Suicide Risk:  Minimal: No identifiable suicidal ideation.  Patients presenting with no risk factors but with morbid ruminations; may be classified as minimal risk based on the severity of the depressive symptoms    Plan Of Care/Follow-up recommendations:  Other:  Follow up with substance use treatment and outpatient psychiatry  Emmaline Kluver, FNP 08/20/2019, 11:30 AM

## 2019-08-21 LAB — LIPASE, BLOOD: Lipase: 43 U/L (ref 11–51)

## 2019-08-21 LAB — COMPREHENSIVE METABOLIC PANEL
ALT: 16 U/L (ref 0–44)
AST: 16 U/L (ref 15–41)
Albumin: 3.5 g/dL (ref 3.5–5.0)
Alkaline Phosphatase: 45 U/L (ref 38–126)
Anion gap: 9 (ref 5–15)
BUN: 28 mg/dL — ABNORMAL HIGH (ref 8–23)
CO2: 24 mmol/L (ref 22–32)
Calcium: 8.2 mg/dL — ABNORMAL LOW (ref 8.9–10.3)
Chloride: 109 mmol/L (ref 98–111)
Creatinine, Ser: 1.72 mg/dL — ABNORMAL HIGH (ref 0.61–1.24)
GFR calc Af Amer: 46 mL/min — ABNORMAL LOW (ref >60)
GFR calc non Af Amer: 40 mL/min — ABNORMAL LOW (ref >60)
Glucose, Bld: 169 mg/dL — ABNORMAL HIGH (ref 70–99)
Potassium: 3.9 mmol/L (ref 3.5–5.1)
Sodium: 142 mmol/L (ref 135–145)
Total Bilirubin: 0.6 mg/dL (ref 0.3–1.2)
Total Protein: 6.5 g/dL (ref 6.5–8.1)

## 2019-08-21 MED ORDER — ONDANSETRON HCL 4 MG PO TABS: 4.0000 mg | ORAL_TABLET | Freq: Three times a day (TID) | ORAL | 0 refills | Status: DC | PRN

## 2019-08-21 NOTE — ED Notes (Signed)
Pt eating and drinking at this time without any N/V.

## 2019-08-21 NOTE — ED Notes (Signed)
Pts midline stopped working, IV team consulted.

## 2019-08-21 NOTE — Discharge Instructions (Signed)
Your history and exam today are consistent with nausea and vomiting leading to your mild dehydration.  Please fill the prescription for nausea medicine to help maintain hydration and rest.  Please follow-up with your primary doctor for further management.  If any symptoms change or worsen, please return to the nearest emergency department.

## 2019-08-23 ENCOUNTER — Other Ambulatory Visit: Payer: Self-pay | Admitting: *Deleted

## 2019-08-23 ENCOUNTER — Inpatient Hospital Stay (INDEPENDENT_AMBULATORY_CARE_PROVIDER_SITE_OTHER): Payer: Medicare Other | Admitting: Primary Care

## 2019-08-23 NOTE — Patient Outreach (Signed)
[facility]) Care Management  08/23/2019  Michael [name] 1951/03/07 [id number]   Outreach attempt #4, unsuccessful.  Noted that member again presented to the ED on 11/12 & 11/13.  Call placed to member this morning to follow up, recording stating member is not receiving calls at this time.  Will close case to nursing at this time due to inability to maintain contact.  Will inform [facility] social worker and pharmacist of case closure.  Valente David, [place], MSN [place] [phone]

## 2019-08-26 ENCOUNTER — Ambulatory Visit: Payer: Self-pay | Admitting: Pharmacist

## 2019-08-26 ENCOUNTER — Other Ambulatory Visit: Payer: Self-pay | Admitting: Pharmacist

## 2019-08-26 NOTE — Patient Outreach (Signed)
[name]) Care Management  08/26/2019  Michael [name] 1951/02/12 [id number]  Patient was called to follow up after his recent hospitalization and ED visits. Unfortunately, he did not answer his phone. Multiple phone calls have been made to the patient. He has also been sent unsuccessful outreach letters by multiple [place].  Plan: Close patient's pharmacy case due to inability to contact. Send [facility] Staff still involved with the patient's care a message.  Elayne Guerin, PharmD, [name] Clinical Pharmacist [phone]

## 2019-09-02 ENCOUNTER — Other Ambulatory Visit: Payer: Self-pay

## 2019-09-02 ENCOUNTER — Emergency Department (HOSPITAL_COMMUNITY): Payer: Medicare Other

## 2019-09-02 ENCOUNTER — Encounter (HOSPITAL_COMMUNITY): Payer: Self-pay | Admitting: Obstetrics and Gynecology

## 2019-09-02 ENCOUNTER — Emergency Department (HOSPITAL_COMMUNITY)
Admission: EM | Admit: 2019-09-02 | Discharge: 2019-09-03 | Disposition: A | Discharge status: Home / Self Care | Payer: Medicare Other | Attending: Emergency Medicine | Admitting: Emergency Medicine

## 2019-09-02 DIAGNOSIS — N183 Chronic kidney disease, stage 3 unspecified: Secondary | ICD-10-CM | POA: Diagnosis not present

## 2019-09-02 DIAGNOSIS — I129 Hypertensive chronic kidney disease with stage 1 through stage 4 chronic kidney disease, or unspecified chronic kidney disease: Secondary | ICD-10-CM | POA: Diagnosis not present

## 2019-09-02 DIAGNOSIS — Z21 Asymptomatic human immunodeficiency virus [HIV] infection status: Secondary | ICD-10-CM | POA: Insufficient documentation

## 2019-09-02 DIAGNOSIS — J45909 Unspecified asthma, uncomplicated: Secondary | ICD-10-CM | POA: Insufficient documentation

## 2019-09-02 DIAGNOSIS — R739 Hyperglycemia, unspecified: Secondary | ICD-10-CM

## 2019-09-02 DIAGNOSIS — F329 Major depressive disorder, single episode, unspecified: Secondary | ICD-10-CM | POA: Diagnosis present

## 2019-09-02 DIAGNOSIS — F332 Major depressive disorder, recurrent severe without psychotic features: Secondary | ICD-10-CM | POA: Insufficient documentation

## 2019-09-02 DIAGNOSIS — F19239 Other psychoactive substance dependence with withdrawal, unspecified: Secondary | ICD-10-CM | POA: Diagnosis present

## 2019-09-02 DIAGNOSIS — F1410 Cocaine abuse, uncomplicated: Secondary | ICD-10-CM | POA: Insufficient documentation

## 2019-09-02 DIAGNOSIS — Z79899 Other long term (current) drug therapy: Secondary | ICD-10-CM | POA: Insufficient documentation

## 2019-09-02 DIAGNOSIS — Z20828 Contact with and (suspected) exposure to other viral communicable diseases: Secondary | ICD-10-CM | POA: Diagnosis not present

## 2019-09-02 DIAGNOSIS — R45851 Suicidal ideations: Secondary | ICD-10-CM | POA: Insufficient documentation

## 2019-09-02 DIAGNOSIS — Z951 Presence of aortocoronary bypass graft: Secondary | ICD-10-CM | POA: Insufficient documentation

## 2019-09-02 DIAGNOSIS — Z794 Long term (current) use of insulin: Secondary | ICD-10-CM | POA: Diagnosis not present

## 2019-09-02 DIAGNOSIS — I2510 Atherosclerotic heart disease of native coronary artery without angina pectoris: Secondary | ICD-10-CM | POA: Insufficient documentation

## 2019-09-02 DIAGNOSIS — E1165 Type 2 diabetes mellitus with hyperglycemia: Secondary | ICD-10-CM | POA: Insufficient documentation

## 2019-09-02 DIAGNOSIS — F32A Depression, unspecified: Secondary | ICD-10-CM

## 2019-09-02 DIAGNOSIS — F321 Major depressive disorder, single episode, moderate: Secondary | ICD-10-CM

## 2019-09-02 LAB — CBG MONITORING, ED
Glucose-Capillary: 373 mg/dL — ABNORMAL HIGH (ref 70–99)
Glucose-Capillary: 170 mg/dL — ABNORMAL HIGH (ref 70–99)

## 2019-09-02 LAB — ETHANOL: Alcohol, Ethyl (B): <10 mg/dL (ref <10)

## 2019-09-02 LAB — ACETAMINOPHEN LEVEL: Acetaminophen (Tylenol), Serum: <10 ug/mL — ABNORMAL LOW (ref 10–30)

## 2019-09-02 LAB — SALICYLATE LEVEL: Salicylate Lvl: <7 mg/dL (ref 2.8–30.0)

## 2019-09-02 LAB — CBC
HCT: 30.5 % — ABNORMAL LOW (ref 39.0–52.0)
Hemoglobin: 9.7 g/dL — ABNORMAL LOW (ref 13.0–17.0)
MCH: 26.4 pg (ref 26.0–34.0)
MCHC: 31.8 g/dL (ref 30.0–36.0)
MCV: 82.9 fL (ref 80.0–100.0)
Platelets: 200 10*3/uL (ref 150–400)
RBC: 3.68 MIL/uL — ABNORMAL LOW (ref 4.22–5.81)
RDW: 14.8 % (ref 11.5–15.5)
WBC: 5.6 10*3/uL (ref 4.0–10.5)
nRBC: 0 % (ref 0.0–0.2)

## 2019-09-02 LAB — COMPREHENSIVE METABOLIC PANEL
ALT: 16 U/L (ref 0–44)
AST: 15 U/L (ref 15–41)
Albumin: 3.8 g/dL (ref 3.5–5.0)
Alkaline Phosphatase: 61 U/L (ref 38–126)
Anion gap: 8 (ref 5–15)
BUN: 27 mg/dL — ABNORMAL HIGH (ref 8–23)
CO2: 22 mmol/L (ref 22–32)
Calcium: 9.3 mg/dL (ref 8.9–10.3)
Chloride: 107 mmol/L (ref 98–111)
Creatinine, Ser: 1.63 mg/dL — ABNORMAL HIGH (ref 0.61–1.24)
GFR calc Af Amer: 49 mL/min — ABNORMAL LOW (ref >60)
GFR calc non Af Amer: 42 mL/min — ABNORMAL LOW (ref >60)
Glucose, Bld: 304 mg/dL — ABNORMAL HIGH (ref 70–99)
Potassium: 4 mmol/L (ref 3.5–5.1)
Sodium: 137 mmol/L (ref 135–145)
Total Bilirubin: 0.3 mg/dL (ref 0.3–1.2)
Total Protein: 7.5 g/dL (ref 6.5–8.1)

## 2019-09-02 LAB — TROPONIN I (HIGH SENSITIVITY): Troponin I (High Sensitivity): 7 ng/L (ref <18)

## 2019-09-02 LAB — RAPID URINE DRUG SCREEN, HOSP PERFORMED
Amphetamines: NOT DETECTED
Barbiturates: NOT DETECTED
Benzodiazepines: NOT DETECTED
Cocaine: POSITIVE — AB
Opiates: NOT DETECTED
Tetrahydrocannabinol: NOT DETECTED

## 2019-09-02 MED ORDER — FAMOTIDINE 20 MG PO TABS: 20.0000 mg | ORAL_TABLET | Freq: Two times a day (BID) | ORAL | Status: DC | PRN

## 2019-09-02 MED ORDER — SERTRALINE HCL 50 MG PO TABS
50.0000 mg | ORAL_TABLET | Freq: Every day | ORAL | Status: DC
  Administered 2019-09-02 – 2019-09-03 (×2): 50 mg via ORAL
  Filled 2019-09-02 (×2): qty 1

## 2019-09-02 MED ORDER — METFORMIN HCL 500 MG PO TABS
1000.0000 mg | ORAL_TABLET | Freq: Two times a day (BID) | ORAL | Status: DC
  Administered 2019-09-02 – 2019-09-03 (×2): 1000 mg via ORAL
  Filled 2019-09-02 (×2): qty 2

## 2019-09-02 MED ORDER — ALBUTEROL SULFATE HFA 108 (90 BASE) MCG/ACT IN AERS: 1.0000 | INHALATION_SPRAY | Freq: Four times a day (QID) | RESPIRATORY_TRACT | Status: DC | PRN

## 2019-09-02 MED ORDER — LOPERAMIDE HCL 2 MG PO CAPS
4.0000 mg | ORAL_CAPSULE | Freq: Once | ORAL | Status: AC
  Administered 2019-09-02: 4 mg via ORAL
  Filled 2019-09-02: qty 2

## 2019-09-02 MED ORDER — GABAPENTIN 400 MG PO CAPS
400.0000 mg | ORAL_CAPSULE | Freq: Two times a day (BID) | ORAL | Status: DC
  Administered 2019-09-02 – 2019-09-03 (×3): 400 mg via ORAL
  Filled 2019-09-02 (×3): qty 1

## 2019-09-02 MED ORDER — INSULIN GLARGINE 100 UNIT/ML ~~LOC~~ SOLN
30.0000 [IU] | Freq: Every day | SUBCUTANEOUS | Status: DC
  Administered 2019-09-02: 30 [IU] via SUBCUTANEOUS
  Filled 2019-09-02 (×2): qty 0.3

## 2019-09-02 MED ORDER — METFORMIN HCL 500 MG PO TABS: 500.0000 mg | ORAL_TABLET | Freq: Two times a day (BID) | ORAL | Status: DC

## 2019-09-02 MED ORDER — DILTIAZEM HCL ER COATED BEADS 120 MG PO CP24
120.0000 mg | ORAL_CAPSULE | Freq: Every day | ORAL | Status: DC
  Administered 2019-09-02 – 2019-09-03 (×2): 120 mg via ORAL
  Filled 2019-09-02 (×2): qty 1

## 2019-09-02 MED ORDER — TRAZODONE HCL 100 MG PO TABS: 100.0000 mg | ORAL_TABLET | Freq: Every evening | ORAL | Status: DC | PRN

## 2019-09-02 NOTE — ED Provider Notes (Signed)
[facility] DEPT Provider Note   CSN: [id number] Arrival date & time: 09/02/19  9024     History   Chief Complaint Chief Complaint  Patient presents with  . Suicidal    HPI Michael [name] is a 68 y.o. male.     Patient c/o feeling very depressed in the past week. Symptoms acute onset, moderate, persistent, felt worse today. States is 'everything' not a single inciting event. States ongoing crack cocaine abuse is one of the main contributors to his depression. Denies heavy or daily etoh use. Denies overdose, ingestion or any attempt to harm self but is having suicidal thoughts. No HI. States compliant w home meds. Had some chest tightness yesterday, at rest, was constant. No associated sob, nv or diaphoresis. No pleuritic pain. No leg pain or swelling. No current or active chest pain. No exertional cp or unusual doe/fatigue.  +decreased appetite w poor po intake in past 1-2 days.   The history is provided by the patient.    Past Medical History:  Diagnosis Date  . A-fib ([place])   . Asthma    No PFTs, history of childhood asthma  . CAD (coronary artery disease)    a. 06/2013 STEMI/PCI ([facility]): LAD w/ thrombus (treated with BMS), mid 75%, D2 75%; LCX OM2 75%; RCA small, PDA 95%, PLV 95%;  b. 10/2013 Cath/PCI: ISR w/in LAD (Promus DES x 2), borderline OM2 lesion;  c. 01/2014 MV: Intermediate risk, medium-sized distal ant wall infarct w/ very small amt of peri-infarct ischemia. EF 60%.  . Cellulitis 04/2014   left facial  . Chondromalacia of medial femoral condyle    Left knee MRI 04/28/12: Chondromalacia of the medial femoral condyle with slight peripheral degeneration of the meniscocapsular junction of the medial meniscus; followed by sports medicine  . Collagen vascular disease ([place])   . Crack cocaine use    for 20+ years, has been enrolled in detox programs in the past  . Depression    with history of hospitalization for suicidal ideation  . Diabetes  mellitus 2002   Diagnosed in 2002, started insulin in 2012  . Gout   . Gout 04/28/2012  . Headache(784.0)    CT head 08/2011: Periventricular and subcortical white matter hypodensities are most in keeping with chronic microangiopathic change  . HIV infection [facility]) Nov 2012   Followed by [name]  . Hyperlipidemia   . Hypertension   . Pulmonary embolism [facility])     Patient Active Problem List   Diagnosis Date Noted  . Osteomyelitis ([place]) 07/17/2019  . Abscess or cellulitis of toe, right   . Toe pain, right-second   . MDD (major depressive disorder), recurrent episode, severe ([place]) 06/15/2019  . Respiratory distress 04/12/2019  . Pneumonia due to severe acute respiratory syndrome coronavirus 2 (SARS-CoV-2) 04/11/2019  . COVID-19 virus infection 03/19/2019  . Chest pain 10/03/2018  . Malnutrition of moderate degree 09/21/2018  . MDD (major depressive disorder), recurrent episode, moderate ([place])   . Type II diabetes mellitus with renal manifestations ([place]) 05/04/2018  . GERD (gastroesophageal reflux disease) 05/04/2018  . S/P CABG (coronary artery bypass graft)   . Left testicular pain   . Depression 02/20/2018  . Epididymo-orchitis, acute 02/20/2018  . Essential hypertension 02/20/2018  . Precordial chest pain 02/20/2018  . AKI (acute kidney injury) ([place]) 02/14/2018  . HCAP (healthcare-associated pneumonia) 02/14/2018  . History of pulmonary embolism 07/04/2017  . Acute hyponatremia 05/11/2017  . Hyperglycemia due to type 2 diabetes mellitus (  [place]) 05/11/2017  . CHF (congestive heart failure) ([place]) 04/01/2017  . Hepatitis C 12/30/2016  . Chronic ischemic heart disease 11/12/2016  . Paroxysmal A-fib ([place]) 11/12/2016  . Back pain 04/18/2016  . S/P carotid endarterectomy 11/15/2015  . MDD (major depressive disorder), recurrent severe, without psychosis ([place]) 09/09/2015  . Cocaine-induced mood disorder ([place]) 08/14/2015  . Cocaine abuse with cocaine-induced mood disorder ([place])  08/14/2015  . Gout 07/10/2015  . Acute renal failure superimposed on stage 3 chronic kidney disease ([place]) 03/06/2015  . Normocytic anemia 03/06/2015  . Chronic kidney disease, stage III (moderate) 03/06/2015  . Hypoglycemia   . Encounter for general adult medical examination with abnormal findings 02/09/2015  . Cocaine use disorder, severe, dependence ([place]) 12/13/2014  . Substance or medication-induced depressive disorder with onset during withdrawal ([place]) 12/13/2014  . Severe recurrent major depressive disorder with psychotic features ([place]) 12/12/2014  . Cervicalgia 06/28/2014  . Lumbar radiculopathy, chronic 06/28/2014  . Asthma, chronic 02/03/2014  . 3-vessel CAD 06/24/2013  . ED (erectile dysfunction) of organic origin 07/07/2012  . Hypertension goal BP (blood pressure) < 140/80 04/29/2012  . Chondromalacia of left knee 03/19/2012  . Hyperlipidemia with target LDL less than 100 02/12/2012  . Fibromyalgia 02/12/2012  . HIV (human immunodeficiency virus infection) ([place]) 08/27/2011  . Uncontrolled type 2 diabetes with neuropathy ([place]) 10/17/2000    Past Surgical History:  Procedure Laterality Date  . AMPUTATION Right 07/21/2019   Procedure: RIGHT SECOND TOE AMPUTATION;  Surgeon: [name], MD;  Location: [facility];  Service: Orthopedics;  Laterality: Right;  . BACK SURGERY     1988  . BOWEL RESECTION    . CARDIAC SURGERY    . CERVICAL SPINE SURGERY     " rods in my neck "  . CORONARY ARTERY BYPASS GRAFT    . CORONARY STENT PLACEMENT    . NM MYOCAR PERF WALL MOTION  12/27/2011   normal  . SPINE SURGERY          Home Medications    Prior to Admission medications   Medication Sig Start Date End Date Taking? Authorizing Provider  acetaminophen (TYLENOL) 325 MG tablet Take 325 mg by mouth every 6 (six) hours as needed for mild pain.    [provider]  albuterol (VENTOLIN HFA) 108 (90 Base) MCG/ACT inhaler Inhale 1-2 puffs into the lungs every 6 (six) hours as  needed for wheezing or shortness of breath. 06/19/19   Mallie Darting, NP  allopurinol (ZYLOPRIM) 100 MG tablet Take 100 mg by mouth daily.  07/30/19   [provider]  diltiazem (CARDIZEM CD) 120 MG 24 hr capsule Take 1 capsule (120 mg total) by mouth daily. For blood pressure 07/30/19 08/29/19  Elodia Florence., MD  famotidine (PEPCID) 20 MG tablet Take 1 tablet (20 mg total) by mouth 2 (two) times daily. For heart burn 07/30/19 08/29/19  Elodia Florence., MD  gabapentin (NEURONTIN) 400 MG capsule Take 1 capsule (400 mg total) by mouth 2 (two) times daily. For agitation/neuropathy 07/30/19 08/29/19  Elodia Florence., MD  HUMALOG 100 UNIT/ML injection Inject 4-6 Units into the skin daily. 07/08/19   [provider]  Insulin Glargine (LANTUS) 100 UNIT/ML Solostar Pen Inject 20 Units into the skin daily. 07/30/19 08/29/19  Elodia Florence., MD  metFORMIN (GLUCOPHAGE) 1000 MG tablet Take 1 tablet (1,000 mg total) by mouth daily. 07/30/19 08/29/19  Elodia Florence., MD  nitroGLYCERIN (NITROSTAT) 0.4 MG SL  tablet Place 1 tablet (0.4 mg total) under the tongue every 5 (five) minutes as needed for chest pain. 06/19/19   Mallie Darting, NP  ondansetron (ZOFRAN) 4 MG tablet Take 1 tablet (4 mg total) by mouth every 8 (eight) hours as needed for nausea or vomiting. 08/21/19   Fatima Blank, MD  sertraline (ZOLOFT) 50 MG tablet Take 1 tablet (50 mg total) by mouth daily. For depression 07/30/19 08/29/19  Elodia Florence., MD  traZODone (DESYREL) 100 MG tablet Take 1 tablet (100 mg total) by mouth at bedtime as needed for sleep. 07/30/19 08/29/19  Elodia Florence., MD    Family History Family History  Problem Relation Age of Onset  . Diabetes Mother   . Hypertension Mother   . Hyperlipidemia Mother   . Diabetes Father   . Cancer Father   . Hypertension Father   . Diabetes Brother   . Heart disease Brother   . Diabetes Sister   . Colon  cancer Neg Hx     Social History Social History   Tobacco Use  . Smoking status: Never Smoker  . Smokeless tobacco: Never Used  Substance Use Topics  . Alcohol use: No    Alcohol/week: 4.0 standard drinks    Types: 2 Cans of beer, 2 Shots of liquor per week  . Drug use: Yes    Frequency: 4.0 times per week    Types: "Crack" cocaine, Cocaine    Comment: Recent use of crack     Allergies   Patient has no known allergies.   Review of Systems Review of Systems  Constitutional: Negative for fever.  HENT: Negative for sore throat.   Eyes: Negative for redness.  Respiratory: Negative for cough and shortness of breath.   Cardiovascular: Negative for palpitations and leg swelling.  Gastrointestinal: Negative for abdominal pain and vomiting.  Genitourinary: Negative for dysuria and flank pain.  Musculoskeletal: Negative for back pain and neck pain.  Skin: Negative for rash.  Neurological: Negative for headaches.  Hematological: Does not bruise/bleed easily.  Psychiatric/Behavioral: Positive for dysphoric mood and suicidal ideas.     Physical Exam Updated Vital Signs BP (!) 141/96 (BP Location: Right Arm)   Pulse 94   Temp 98.3 F (36.8 C) (Oral)   Resp 18   SpO2 100%   Physical Exam Vitals signs and nursing note reviewed.  Constitutional:      Appearance: Normal appearance. He is well-developed.  HENT:     Head: Atraumatic.     Nose: Nose normal.     Mouth/Throat:     Mouth: Mucous membranes are moist.     Pharynx: Oropharynx is clear.  Eyes:     General: No scleral icterus.    Conjunctiva/sclera: Conjunctivae normal.     Pupils: Pupils are equal, round, and reactive to light.  Neck:     Musculoskeletal: Normal range of motion and neck supple. No neck rigidity.     Trachea: No tracheal deviation.  Cardiovascular:     Rate and Rhythm: Normal rate and regular rhythm.     Pulses: Normal pulses.     Heart sounds: Normal heart sounds. No murmur. No friction rub.  No gallop.   Pulmonary:     Effort: Pulmonary effort is normal. No accessory muscle usage or respiratory distress.     Breath sounds: Normal breath sounds.  Abdominal:     General: Bowel sounds are normal. There is no distension.     Palpations: Abdomen  is soft.     Tenderness: There is no abdominal tenderness. There is no guarding.  Genitourinary:    Comments: No cva tenderness. Musculoskeletal:        General: No swelling or tenderness.     Right lower leg: No edema.     Left lower leg: No edema.  Skin:    General: Skin is warm and dry.     Findings: No rash.  Neurological:     Mental Status: He is alert.     Comments: Alert, speech clear. Motor/sens grossly intact bil. Steady gait.   Psychiatric:        Mood and Affect: Mood normal.      ED Treatments / Results  Labs (all labs ordered are listed, but only abnormal results are displayed) Labs Reviewed  COMPREHENSIVE METABOLIC PANEL - Abnormal; Notable for the following components:      Result Value   Glucose, Bld 304 (*)    BUN 27 (*)    Creatinine, Ser 1.63 (*)    GFR calc non Af Amer 42 (*)    GFR calc Af Amer 49 (*)    All other components within normal limits  ACETAMINOPHEN LEVEL - Abnormal; Notable for the following components:   Acetaminophen (Tylenol), Serum <10 (*)    All other components within normal limits  CBC - Abnormal; Notable for the following components:   RBC 3.68 (*)    Hemoglobin 9.7 (*)    HCT 30.5 (*)    All other components within normal limits  RAPID URINE DRUG SCREEN, HOSP PERFORMED - Abnormal; Notable for the following components:   Cocaine POSITIVE (*)    All other components within normal limits  SARS CORONAVIRUS 2 (TAT 6-24 HRS)  ETHANOL  SALICYLATE LEVEL  TROPONIN I (HIGH SENSITIVITY)  TROPONIN I (HIGH SENSITIVITY)    EKG EKG Interpretation  Date/Time:  Thursday September 02 2019 08:54:51 EST Ventricular Rate:  91 PR Interval:    QRS Duration: 86 QT Interval:  348 QTC  Calculation: 429 R Axis:   74 Text Interpretation: Sinus rhythm Nonspecific ST abnormality Confirmed by Lajean Saver [phone]) on 09/02/2019 10:59:25 AM   Radiology Xr Chest Portable  Result Date: 09/02/2019 CLINICAL DATA:  Chest pain EXAM: PORTABLE CHEST 1 VIEW COMPARISON:  08/18/2019 FINDINGS: Lungs are clear.  No pleural effusion or pneumothorax. The heart is normal in size. Postsurgical changes related to prior CABG. Median sternotomy. Cervical spine fixation hardware, incompletely visualized. IMPRESSION: No evidence of acute cardiopulmonary disease. Electronically Signed   By: Julian Hy M.D.   On: 09/02/2019 11:35    Procedures Procedures (including critical care time)  Medications Ordered in ED Medications - No data to display   Initial Impression / Assessment and Plan / ED Course  I have reviewed the triage vital signs and the nursing notes.  Pertinent labs & imaging results that were available during my care of the patient were reviewed by me and considered in my medical decision making (see chart for details).  Iv ns. Labs. [facility] team consulted.  Reviewed nursing notes and prior charts for additional history.   Labs reviewed/interpreted by me - after symptoms onset yesterday, trop is normal, felt not c/w acs, possibly msk chest pain and/or related to cocaine use. No current cp or discomfort. UDS + cocaine.   Xrays reviewed/interpreted by me - no pna.   [facility] evaluation pending - disposition per [facility] team.    Final Clinical Impressions(s) / ED Diagnoses   Final  diagnoses:  None    ED Discharge Orders    None       Lajean Saver, MD 09/03/19 [phone]

## 2019-09-02 NOTE — BH Assessment (Addendum)
Tele Assessment Note   Patient Name: Michael [name] MRN: [id number] Referring Physician: Lacretia Leigh, MD Location of Patient: [facility] Location of Provider: [place] [facility]  Saifan Rayford is an 68 y.o. male. Pt presents to the [facility] voluntarily for passive SI thoughts. Per EDP 09/02/19, "Patient c/o feeling very depressed in the past week. Symptoms acute onset, moderate, persistent, felt worse today. States is 'everything' not a single inciting event. States ongoing crack cocaine abuse is one of the main contributors to his depression. Denies heavy or daily etoh use. Denies overdose, ingestion or any attempt to harm self but is having suicidal thoughts. No HI. States compliant w home meds. Had some chest tightness yesterday, at rest, was constant. No associated sob, nv or diaphoresis. No pleuritic pain. No leg pain or swelling. No current or active chest pain. No exertional cp or unusual doe/fatigue.  decreased appetite w poor po intake in past 1-2 days."   During assessment pt presented depressed, sad and sullen. Pt states that he is here because, " I am having some suicidal thoughts, they stay with me all the time". Pt has no specific plan and but has history of passive SI thoughts per his last TTS assessments. Pt presented to hospital on 08-19-19 for similar presentation. Pt also has been assessed multiple times (11/11, 11/4,) and was inpatient September 2020 for similar presentation.Pt per last assessment did not follow up with outpatient resources for assistance with substance use and homelessness.  Pt states he only attempted to take his life 1 time, last year and tried to cut him himself and walk in traffic. Pt denies HI and any recent self injurious behaviors. Pt admits to [place], says that voices are telling him to kill himself. Pt also states that he seen "a man dressed in black walking across the floor in his hotel room and bugs crawling on a wall 2 days ago".  Pt  reports current substance abuse of crack cocaine and states that he spent $700.00 on the substance in the last 3 days. Pt did not disclose how much he used but said it was "a lot". Pt says that he has been abusing crack/cocaine since he was 68 years old. Pt states he is only sleeping 2 to 3 hours a night and has a horrible appetite. Pt states no specific stressors, he says that he does not like the way his life is and feels constantly stressed out all the time.Pt says that he also thinks about his wife, who died 3 years ago. Pt says he feels depressed most of time exhibiting symptoms such as hopelessness, isolation, and constant sadness.   Pt currently homeless, states he is living in the [facility]. Pt currently has no provider and states he is not taking any psychitric medications, but does take medications for his various medical issues. Pt states he wants help with place to stay and does not feel safe in community and wants long term inpatient treatment also . Pt also could not contract for safety.    During assessment, Pt presented as alert and oriented.  He had good fair contact and was cooperative.  He presented in scrubs  Pt's mood was depressed and sad, and affect was also depressed.  Pt's speech was normal and coherent.  Thought processes were within normal range, and thought content was logical and coherent  There was no evidence of delusion or response to internal stimuli.  Pt's memory and concentration were intact.  Insight, judgment,  and impulse control were poor.  Diagnosis: Major Depressive Disorder, Recurrent, Severe w/opsychotic features; Cocaine Use Disorder  Past Medical History:  Past Medical History:  Diagnosis Date  . A-fib ([place])   . Asthma    No PFTs, history of childhood asthma  . CAD (coronary artery disease)    a. 06/2013 STEMI/PCI ([facility]): LAD w/ thrombus (treated with BMS), mid 75%, D2 75%; LCX OM2 75%; RCA small, PDA 95%, PLV 95%;  b. 10/2013 Cath/PCI: ISR w/in LAD  (Promus DES x 2), borderline OM2 lesion;  c. 01/2014 MV: Intermediate risk, medium-sized distal ant wall infarct w/ very small amt of peri-infarct ischemia. EF 60%.  . Cellulitis 04/2014   left facial  . Chondromalacia of medial femoral condyle    Left knee MRI 04/28/12: Chondromalacia of the medial femoral condyle with slight peripheral degeneration of the meniscocapsular junction of the medial meniscus; followed by sports medicine  . Collagen vascular disease ([place])   . Crack cocaine use    for 20+ years, has been enrolled in detox programs in the past  . Depression    with history of hospitalization for suicidal ideation  . Diabetes mellitus 2002   Diagnosed in 2002, started insulin in 2012  . Gout   . Gout 04/28/2012  . Headache(784.0)    CT head 08/2011: Periventricular and subcortical white matter hypodensities are most in keeping with chronic microangiopathic change  . HIV infection [facility]) Nov 2012   Followed by [name]  . Hyperlipidemia   . Hypertension   . Pulmonary embolism [facility])     Past Surgical History:  Procedure Laterality Date  . AMPUTATION Right 07/21/2019   Procedure: RIGHT SECOND TOE AMPUTATION;  Surgeon: [name], MD;  Location: [facility];  Service: Orthopedics;  Laterality: Right;  . BACK SURGERY     1988  . BOWEL RESECTION    . CARDIAC SURGERY    . CERVICAL SPINE SURGERY     " rods in my neck "  . CORONARY ARTERY BYPASS GRAFT    . CORONARY STENT PLACEMENT    . NM MYOCAR PERF WALL MOTION  12/27/2011   normal  . SPINE SURGERY      Family History:  Family History  Problem Relation Age of Onset  . Diabetes Mother   . Hypertension Mother   . Hyperlipidemia Mother   . Diabetes Father   . Cancer Father   . Hypertension Father   . Diabetes Brother   . Heart disease Brother   . Diabetes Sister   . Colon cancer Neg Hx     Social History:  reports that he has never smoked. He has never used smokeless tobacco. He reports current drug use. Frequency:  4.00 times per week. Drugs: "Crack" cocaine and Cocaine. He reports that he does not drink alcohol.  Additional Social History:  Alcohol / Drug Use Pain Medications: see MAR Prescriptions: see MAR Over the Counter: see MAR History of alcohol / drug use?: Yes Substance #1 Name of Substance 1: crack/cocaine  CIWA: CIWA-Ar BP: (!) 151/88 Pulse Rate: 83 COWS:    Allergies: No Known Allergies  Home Medications: (Not in a hospital admission)   OB/GYN Status:  No LMP for male patient.  General Assessment Data Location of Assessment: [facility] TTS Assessment: In system Is this a Tele or Face-to-Face Assessment?: Tele Assessment Is this an Initial Assessment or a Re-assessment for this encounter?: Initial Assessment Patient Accompanied by:: N/A Language Other than English: No Living Arrangements:  Homeless/Shelter What gender do you identify as?: Male Marital status: Widowed Admission Status: Voluntary Is patient capable of signing voluntary admission?: Yes Referral Source: Self/Family/Friend        Education Status Is patient currently in school?: No Is the patient employed, unemployed or receiving disability?: Receiving disability income  Risk to self with the past 6 months Suicidal Ideation: Yes-Currently Present Has patient been a risk to self within the past 6 months prior to admission? : Yes Suicidal Intent: No Has patient had any suicidal intent within the past 6 months prior to admission? : Yes Is patient at risk for suicide?: Yes Suicidal Plan?: No-Not Currently/Within Last 6 Months Has patient had any suicidal plan within the past 6 months prior to admission? : Yes Specify Current Suicidal Plan: none today but some in the past Access to Means: Yes Specify Access to Suicidal Means: access to cocaine and walking in traffic What has been your use of drugs/alcohol within the last 12 months?: cocaine Previous Attempts/Gestures: Yes How many times?: 1 Other Self Harm  Risks: depression, past SI , no support, drug use Triggers for Past Attempts: Unknown Intentional Self Injurious Behavior: Cutting(1 year ago cutting) Comment - Self Injurious Behavior: cutting Family Suicide History: No Recent stressful life event(s): Other (Comment), Recent negative physical changes, Financial Problems Persecutory voices/beliefs?: No Depression: Yes Depression Symptoms: Despondent, Insomnia, Tearfulness, Feeling worthless/self pity Substance abuse history and/or treatment for substance abuse?: Yes Suicide prevention information given to non-admitted patients: Not applicable  Risk to Others within the past 6 months Homicidal Ideation: No-Not Currently/Within Last 6 Months Does patient have any lifetime risk of violence toward others beyond the six months prior to admission? : Yes (comment) Thoughts of Harm to Others: No Current Homicidal Intent: No Current Homicidal Plan: No Access to Homicidal Means: No History of harm to others?: No Assessment of Violence: None Noted Does patient have access to weapons?: No Criminal Charges Pending?: No Does patient have a court date: No Is patient on probation?: No  Psychosis Hallucinations: Auditory, Visual, With command Delusions: None noted  Mental Status Report Appearance/Hygiene: In scrubs Eye Contact: Fair Motor Activity: Freedom of movement Speech: Logical/coherent Level of Consciousness: Alert Mood: Depressed, Despair, Sad, Sullen, Helpless Affect: Depressed, Sad, Sullen Anxiety Level: Minimal Judgement: Impaired Orientation: Place, Person, Situation, Time, Appropriate for developmental age Obsessive Compulsive Thoughts/Behaviors: None  Cognitive Functioning Concentration: Normal Memory: Recent Intact Is patient IDD: No Insight: Poor Impulse Control: Poor Appetite: Poor Have you had any weight changes? : Loss Sleep: Decreased Total Hours of Sleep: 2 Vegetative Symptoms: None  ADLScreening [facility]  Assessment Services) Patient's cognitive ability adequate to safely complete daily activities?: Yes Patient able to express need for assistance with ADLs?: Yes Independently performs ADLs?: Yes (appropriate for developmental age)  Prior Inpatient Therapy Prior Inpatient Therapy: Yes Prior Therapy Dates: 2020, 2019, and multiple other admissions Prior Therapy Facilty/Provider(s): [facility] Reason for Treatment: Suicidal ideation  Prior Outpatient Therapy Prior Outpatient Therapy: Yes Prior Therapy Dates: 2020 Prior Therapy Facilty/Provider(s): [facility] Reason for Treatment: Depressive symptoms Does patient have an ACCT team?: No Does patient have Intensive In-House Services?  : No Does patient have [facility] services? : No Does patient have [facility] services?: No  ADL Screening (condition at time of admission) Patient's cognitive ability adequate to safely complete daily activities?: Yes Patient able to express need for assistance with ADLs?: Yes Independently performs ADLs?: Yes (appropriate for developmental age)  Advance Directives (For Healthcare) Does Patient Have a Medical Advance Directive?: No Would patient like information on creating a medical advance directive?: No - Patient declined          Disposition: Per Talbot Grumbling, NP pt is recommended for overnight observation, reassessed by psychiatry in the morning as well as Social Work Scientific laboratory technician. TTS confirmed pt status with current attending provider.       Disposition Initial Assessment Completed for this Encounter: Yes  This service was provided via telemedicine using a 2-way, interactive audio and video technology.  Names of all persons participating in this telemedicine service and their role in this encounter. Name: Garwood Wentzell Role: Patient  Name: Antony Contras Role: TTS Counselor  Name:  Role:   Name:  Role:     Donato Heinz 09/02/2019 9:38 PM

## 2019-09-02 NOTE — ED Notes (Signed)
Pt states that he would like to have long-term inpatient.

## 2019-09-02 NOTE — ED Notes (Signed)
Pt changed into purple scrubs and wanded by security. All belongings placed in pt belonging bags (total of 3). Pt had pants, belt, shirt, sweater, jacket, socks, and shoes with him. Bags labeled and at bedside.

## 2019-09-02 NOTE — ED Triage Notes (Signed)
Patient [place] at the police department with complaints of SI. Patient reports he "has a lot going on and wants to kill himself" Patient is reportedly homeless

## 2019-09-02 NOTE — ED Notes (Signed)
Pt c/o of chest pain that radiates across his chest. Reports his chest is tight. Reports hx of triple bypass. RN notified.

## 2019-09-02 NOTE — Progress Notes (Signed)
Received Michael [name] this PM at shift change asleep in bed with the sitter at the bedside. He was OOB with the aid of a walker related to an unsteady gait.

## 2019-09-03 LAB — CBG MONITORING, ED
Glucose-Capillary: 152 mg/dL — ABNORMAL HIGH (ref 70–99)
Glucose-Capillary: 184 mg/dL — ABNORMAL HIGH (ref 70–99)

## 2019-09-03 LAB — SARS CORONAVIRUS 2 (TAT 6-24 HRS): SARS Coronavirus 2: NEGATIVE

## 2019-09-03 MED ORDER — INSULIN GLARGINE 100 UNIT/ML ~~LOC~~ SOLN
30.0000 [IU] | Freq: Every day | SUBCUTANEOUS | Status: DC
  Filled 2019-09-03: qty 0.3

## 2019-09-03 NOTE — Discharge Instructions (Signed)
For your behavioral health needs, you are advised to follow up with an outpatient provider.  The following practices offer psychiatry, therapy, and chemical dependency intensive outpatient programs.  Contact them at your earliest opportunity to ask about scheduling an intake appointment:       [facility] at [facility]. [address] [place], [place] [address]      [phone]       [facility]      [place], [place] [address]      [phone]  If you feel that you need residential treatment for substance abuse, contact one of the following facilities to ask about being admitted:       [facility]      [address]      [place], [place] [address]      [phone]       [facility]      [place]      [place], [place] [address]      [phone]       [facility] [place] [address]      [phone]

## 2019-09-03 NOTE — Consult Note (Addendum)
[facility] Psych ED Discharge  09/03/2019 11:05 AM Michael [name]  MRN:  [id number] Principal Problem: <principal problem not specified> Discharge Diagnoses: Active Problems:   Substance or medication-induced depressive disorder with onset during withdrawal [facility])   Reason for Consult:  SI Referring Physician:  EDP  Total Time spent with patient: 15 minutes  Subjective:   Michael [name] is a 68 y.o. male patient who presents with depression, suicidal thoughts, substance abuse and hallucinations. He is very familiar to our facility. He does endorse some ongoing suicidal thoughts and depressive symptoms, however states these thoughts are worsened by his substance use.  68 year old male who returns with passive suicidal ideations and cocaine use. He has multiple presentations which are concerning for malingering behavior. He generally presents with similar symptoms as well. He has had 5 admissions and multiple ER visits for the like. Today he is seeking help and assistance with his substance abuse and wants "to get out the streets. I need long term help." Discussed with patient he may benefit from outpatient resources. At the time he denies si/hi/avh.    HPI: Michael [name] is an 68 y.o. male. Pt presents to the [facility] voluntarily for passive SI thoughts. Per EDP 09/02/19, "Patient c/o feeling very depressed in the past week. Symptoms acute onset, moderate, persistent, felt worse today. States is 'everything' not a single inciting event. States ongoing crack cocaine abuse is one of the main contributors to his depression. Denies heavy or daily etoh use. Denies overdose, ingestion or any attempt to harm self but is having suicidal thoughts. No HI. States compliant w home meds. Had some chest tightness yesterday, at rest, was constant. No associated sob, nv or diaphoresis. No pleuritic pain. No leg pain or swelling. No current or active chest pain. No exertional cp or unusual doe/fatigue.  decreased appetite w poor po intake in past 1-2 days."   During assessment pt presented depressed, sad and sullen. Pt states that he is here because, " I am having some suicidal thoughts, they stay with me all the time". Pt has no specific plan and but has history of passive SI thoughts per his last TTS assessments. Pt presented to hospital on 08-19-19 for similar presentation. Pt also has been assessed multiple times (11/11, 11/4,) and was inpatient September 2020 for similar presentation.Pt per last assessment did not follow up with outpatient resources for assistance with substance use and homelessness.  Pt states he only attempted to take his life 1 time, last year and tried to cut him himself and walk in traffic. Pt denies HI and any recent self injurious behaviors. Pt admits to [place], says that voices are telling him to kill himself. Pt also states that he seen "a man dressed in black walking across the floor in his hotel room and bugs crawling on a wall 2 days ago".  During assessment, Pt presented as alert and oriented.  He had good eye contact and was cooperative.  He appeared appropriately groomed.  Pt's mood was depressed, and affect was also depressed.  Pt was tearful.  Pt's speech was normal in rate, rhythm, and volume.  Thought processes were within normal range, and thought content was logical and goal-oriented.  There was no evidence of delusion.  Pt's memory and concentration were intact.  Insight, judgment, and impulse control were fair to poor.   Past Psychiatric History: MDD, GAD, Cocaine Use Disorder  Risk to Self: Suicidal Ideation: Yes-Currently Present Suicidal Intent: No Is patient at  risk for suicide?: Yes Suicidal Plan?: No-Not Currently/Within Last 6 Months Specify Current Suicidal Plan: none today but some in the past Access to Means: Yes Specify Access to Suicidal Means: access to cocaine and walking in traffic What has been your use of drugs/alcohol within the last  12 months?: cocaine How many times?: 1 Other Self Harm Risks: depression, past SI , no support, drug use Triggers for Past Attempts: Unknown Intentional Self Injurious Behavior: Cutting(1 year ago cutting) Comment - Self Injurious Behavior: cutting Risk to Others: Homicidal Ideation: No-Not Currently/Within Last 6 Months Thoughts of Harm to Others: No Current Homicidal Intent: No Current Homicidal Plan: No Access to Homicidal Means: No History of harm to others?: No Assessment of Violence: None Noted Does patient have access to weapons?: No Criminal Charges Pending?: No Does patient have a court date: No Prior Inpatient Therapy: Prior Inpatient Therapy: Yes Prior Therapy Dates: 2020, 2019, and multiple other admissions Prior Therapy Facilty/Provider(s): [facility] Reason for Treatment: Suicidal ideation Prior Outpatient Therapy: Prior Outpatient Therapy: Yes Prior Therapy Dates: 2020 Prior Therapy Facilty/Provider(s): [facility] Reason for Treatment: Depressive symptoms Does patient have an ACCT team?: No Does patient have Intensive In-House Services?  : No Does patient have [facility] services? : No Does patient have [facility] services?: No  Past Medical History:  Past Medical History:  Diagnosis Date  . A-fib ([place])   . Asthma    No PFTs, history of childhood asthma  . CAD (coronary artery disease)    a. 06/2013 STEMI/PCI ([facility]): LAD w/ thrombus (treated with BMS), mid 75%, D2 75%; LCX OM2 75%; RCA small, PDA 95%, PLV 95%;  b. 10/2013 Cath/PCI: ISR w/in LAD (Promus DES x 2), borderline OM2 lesion;  c. 01/2014 MV: Intermediate risk, medium-sized distal ant wall infarct w/ very small amt of peri-infarct ischemia. EF 60%.  . Cellulitis 04/2014   left facial  . Chondromalacia of medial femoral condyle    Left knee MRI 04/28/12: Chondromalacia of the medial femoral condyle with slight peripheral degeneration of the meniscocapsular junction of the medial meniscus; followed by sports  medicine  . Collagen vascular disease ([place])   . Crack cocaine use    for 20+ years, has been enrolled in detox programs in the past  . Depression    with history of hospitalization for suicidal ideation  . Diabetes mellitus 2002   Diagnosed in 2002, started insulin in 2012  . Gout   . Gout 04/28/2012  . Headache(784.0)    CT head 08/2011: Periventricular and subcortical white matter hypodensities are most in keeping with chronic microangiopathic change  . HIV infection [facility]) Nov 2012   Followed by [name]  . Hyperlipidemia   . Hypertension   . Pulmonary embolism [facility])     Past Surgical History:  Procedure Laterality Date  . AMPUTATION Right 07/21/2019   Procedure: RIGHT SECOND TOE AMPUTATION;  Surgeon: [name], MD;  Location: [facility];  Service: Orthopedics;  Laterality: Right;  . BACK SURGERY     1988  . BOWEL RESECTION    . CARDIAC SURGERY    . CERVICAL SPINE SURGERY     " rods in my neck "  . CORONARY ARTERY BYPASS GRAFT    . CORONARY STENT PLACEMENT    . NM MYOCAR PERF WALL MOTION  12/27/2011   normal  . SPINE SURGERY     Family History:  Family History  Problem Relation Age of Onset  . Diabetes Mother   .  Hypertension Mother   . Hyperlipidemia Mother   . Diabetes Father   . Cancer Father   . Hypertension Father   . Diabetes Brother   . Heart disease Brother   . Diabetes Sister   . Colon cancer Neg Hx    Family Psychiatric  History: Denies Social History:  Social History   Substance and Sexual Activity  Alcohol Use No  . Alcohol/week: 4.0 standard drinks  . Types: 2 Cans of beer, 2 Shots of liquor per week     Social History   Substance and Sexual Activity  Drug Use Yes  . Frequency: 4.0 times per week  . Types: "Crack" cocaine, Cocaine   Comment: Recent use of crack    Social History   Socioeconomic History  . Marital status: Widowed    Spouse name: Not on file  . Number of children: 2  . Years of education: 38  . Highest education  level: 12th grade  Occupational History    Employer: UNEMPLOYED    Comment: 04/2016  Social Needs  . Financial resource strain: Somewhat hard  . Food insecurity    Worry: Often true    Inability: Often true  . Transportation needs    Medical: Yes    Non-medical: Yes  Tobacco Use  . Smoking status: Never Smoker  . Smokeless tobacco: Never Used  Substance and Sexual Activity  . Alcohol use: No    Alcohol/week: 4.0 standard drinks    Types: 2 Cans of beer, 2 Shots of liquor per week  . Drug use: Yes    Frequency: 4.0 times per week    Types: "Crack" cocaine, Cocaine    Comment: Recent use of crack  . Sexual activity: Yes    Comment: accepted condoms  Lifestyle  . Physical activity    Days per week: 0 days    Minutes per session: 0 min  . Stress: Very much  Relationships  . Social Herbalist on phone: Once a week    Gets together: Never    Attends religious service: Never    Active member of club or organization: No    Attends meetings of clubs or organizations: Never    Relationship status: Widowed  Other Topics Concern  . Not on file  Social History Narrative   Currently staying with a friend in [place].  Was staying @ local motel until a few days ago - left b/c of bed bugs.   [facility].  Not followed by a psychiatrist.   Additional Social History: N/A    Allergies:  No Known Allergies  Labs:  Results for orders placed or performed during the hospital encounter of 09/02/19 (from the past 48 hour(s))  Rapid urine drug screen (hospital performed)     Status: Abnormal   Collection Time: 09/02/19  8:49 AM  Result Value Ref Range   Opiates NONE DETECTED NONE DETECTED   Cocaine POSITIVE (A) NONE DETECTED   Benzodiazepines NONE DETECTED NONE DETECTED   Amphetamines NONE DETECTED NONE DETECTED   Tetrahydrocannabinol NONE DETECTED NONE DETECTED   Barbiturates NONE DETECTED NONE DETECTED    Comment: (NOTE) DRUG SCREEN FOR MEDICAL  PURPOSES ONLY.  IF CONFIRMATION IS NEEDED FOR ANY PURPOSE, NOTIFY LAB WITHIN 5 DAYS. LOWEST DETECTABLE LIMITS FOR URINE DRUG SCREEN Drug Class                     Cutoff (ng/mL) Amphetamine and metabolites  1000 Barbiturate and metabolites    200 Benzodiazepine                 287 Tricyclics and metabolites     300 Opiates and metabolites        300 Cocaine and metabolites        300 THC                            50 Performed at [facility], [employer] [address] [place], [place] [address]   Comprehensive metabolic panel     Status: Abnormal   Collection Time: 09/02/19  9:48 AM  Result Value Ref Range   Sodium 137 135 - 145 mmol/L   Potassium 4.0 3.5 - 5.1 mmol/L   Chloride 107 98 - 111 mmol/L   CO2 22 22 - 32 mmol/L   Glucose, Bld 304 (H) 70 - 99 mg/dL   BUN 27 (H) 8 - 23 mg/dL   Creatinine, Ser 1.63 (H) 0.61 - 1.24 mg/dL   Calcium 9.3 8.9 - 10.3 mg/dL   Total Protein 7.5 6.5 - 8.1 g/dL   Albumin 3.8 3.5 - 5.0 g/dL   AST 15 15 - 41 U/L   ALT 16 0 - 44 U/L   Alkaline Phosphatase 61 38 - 126 U/L   Total Bilirubin 0.3 0.3 - 1.2 mg/dL   GFR calc non Af Amer 42 (L) >60 mL/min   GFR calc Af Amer 49 (L) >60 mL/min   Anion gap 8 5 - 15    Comment: Performed at [facility], [place] [address], [place], [place] [address]  Ethanol     Status: None   Collection Time: 09/02/19  9:48 AM  Result Value Ref Range   Alcohol, Ethyl (B) <10 <10 mg/dL    Comment: (NOTE) Lowest detectable limit for serum alcohol is 10 mg/dL. For medical purposes only. Performed at [facility], [place] [address], [place], [place] [address]   Salicylate level     Status: None   Collection Time: 09/02/19  9:48 AM  Result Value Ref Range   Salicylate Lvl <3.6 2.8 - 30.0 mg/dL    Comment: Performed at [facility], [place] [address] [place], [place] [address]  Acetaminophen level     Status: Abnormal   Collection Time: 09/02/19  9:48 AM   Result Value Ref Range   Acetaminophen (Tylenol), Serum <10 (L) 10 - 30 ug/mL    Comment: (NOTE) Therapeutic concentrations vary significantly. A range of 10-30 ug/mL  may be an effective concentration for many patients. However, some  are best treated at concentrations outside of this range. Acetaminophen concentrations >150 ug/mL at 4 hours after ingestion  and >50 ug/mL at 12 hours after ingestion are often associated with  toxic reactions. Performed at [facility], [place] [address] [place], [place] [address]   cbc     Status: Abnormal   Collection Time: 09/02/19  9:48 AM  Result Value Ref Range   WBC 5.6 4.0 - 10.5 K/uL   RBC 3.68 (L) 4.22 - 5.81 MIL/uL   Hemoglobin 9.7 (L) 13.0 - 17.0 g/dL   HCT 30.5 (L) 39.0 - 52.0 %   MCV 82.9 80.0 - 100.0 fL   MCH 26.4 26.0 - 34.0 pg   MCHC 31.8 30.0 - 36.0 g/dL   RDW 14.8 11.5 - 15.5 %   Platelets 200 150 - 400  K/uL   nRBC 0.0 0.0 - 0.2 %    Comment: Performed at [facility], [place] [address] [place], [place] [address]  Troponin I (High Sensitivity)     Status: None   Collection Time: 09/02/19  9:48 AM  Result Value Ref Range   Troponin I (High Sensitivity) 7 <18 ng/L    Comment: (NOTE) Elevated high sensitivity troponin I (hsTnI) values and significant  changes across serial measurements may suggest ACS but many other  chronic and acute conditions are known to elevate hsTnI results.  Refer to the "Links" section for chest pain algorithms and additional  guidance. Performed at [facility], [place] [address] [place], [place] [address]   SARS CORONAVIRUS 2 (TAT 6-24 HRS) Nasopharyngeal Nasopharyngeal Swab     Status: None   Collection Time: 09/02/19 11:05 AM   Specimen: Nasopharyngeal Swab  Result Value Ref Range   SARS Coronavirus 2 NEGATIVE NEGATIVE    Comment: (NOTE) SARS-CoV-2 target nucleic acids are NOT DETECTED. The SARS-CoV-2 RNA is generally detectable in upper and  lower respiratory specimens during the acute phase of infection. Negative results do not preclude SARS-CoV-2 infection, do not rule out co-infections with other pathogens, and should not be used as the sole basis for treatment or other patient management decisions. Negative results must be combined with clinical observations, patient history, and epidemiological information. The expected result is Negative. Fact Sheet for Patients: SugarRoll.be Fact Sheet for Healthcare Providers: https://www.woods-mathews.com/ This test is not yet approved or cleared by the [place] FDA and  has been authorized for detection and/or diagnosis of SARS-CoV-2 by FDA under an Emergency Use Authorization (EUA). This EUA will remain  in effect (meaning this test can be used) for the duration of the COVID-19 declaration under Section 56 4(b)(1) of the Act, 21 U.S.C. section 360bbb-3(b)(1), unless the authorization is terminated or revoked sooner. Performed at [facility], [place] [address], [place], [place] [address]   CBG monitoring, ED     Status: Abnormal   Collection Time: 09/02/19  3:16 PM  Result Value Ref Range   Glucose-Capillary 373 (H) 70 - 99 mg/dL  CBG monitoring, ED     Status: Abnormal   Collection Time: 09/02/19  9:29 PM  Result Value Ref Range   Glucose-Capillary 170 (H) 70 - 99 mg/dL  CBG monitoring, ED     Status: Abnormal   Collection Time: 09/03/19  7:18 AM  Result Value Ref Range   Glucose-Capillary 152 (H) 70 - 99 mg/dL  CBG monitoring, ED     Status: Abnormal   Collection Time: 09/03/19 11:28 AM  Result Value Ref Range   Glucose-Capillary 184 (H) 70 - 99 mg/dL    Medications:  Current Facility-Administered Medications  Medication Dose Route Frequency Provider Last Rate Last Dose  . albuterol (VENTOLIN HFA) 108 (90 Base) MCG/ACT inhaler 1-2 puff  1-2 puff Inhalation Q6H PRN Lajean Saver, MD      . diltiazem (CARDIZEM CD) 24  hr capsule 120 mg  120 mg Oral Daily Lajean Saver, MD   120 mg at 09/03/19 0931  . famotidine (PEPCID) tablet 20 mg  20 mg Oral BID PRN Lajean Saver, MD      . gabapentin (NEURONTIN) capsule 400 mg  400 mg Oral BID Lajean Saver, MD   400 mg at 09/03/19 0932  . insulin glargine (LANTUS) injection 30 Units  30 Units Subcutaneous QHS Lajean Saver, MD      . metFORMIN (  GLUCOPHAGE) tablet 1,000 mg  1,000 mg Oral BID WC Lajean Saver, MD   1,000 mg at 09/03/19 0931  . sertraline (ZOLOFT) tablet 50 mg  50 mg Oral Daily Lajean Saver, MD   50 mg at 09/03/19 0931  . traZODone (DESYREL) tablet 100 mg  100 mg Oral QHS PRN Lajean Saver, MD       Current Outpatient Medications  Medication Sig Dispense Refill  . albuterol (VENTOLIN HFA) 108 (90 Base) MCG/ACT inhaler Inhale 1-2 puffs into the lungs every 6 (six) hours as needed for wheezing or shortness of breath.    . allopurinol (ZYLOPRIM) 100 MG tablet Take 100 mg by mouth daily as needed (gout).     Marland Kitchen diltiazem (CARDIZEM CD) 120 MG 24 hr capsule Take 1 capsule (120 mg total) by mouth daily. For blood pressure 30 capsule 0  . famotidine (PEPCID) 20 MG tablet Take 1 tablet (20 mg total) by mouth 2 (two) times daily. For heart burn (Patient taking differently: Take 20 mg by mouth 2 (two) times daily as needed for heartburn. ) 60 tablet 0  . gabapentin (NEURONTIN) 400 MG capsule Take 1 capsule (400 mg total) by mouth 2 (two) times daily. For agitation/neuropathy (Patient taking differently: Take 400 mg by mouth 2 (two) times daily as needed (agitation/neuropathy). ) 60 capsule 0  . HUMALOG 100 UNIT/ML injection Inject 4-6 Units into the skin daily.    . Insulin Glargine (LANTUS) 100 UNIT/ML Solostar Pen Inject 20 Units into the skin daily. (Patient taking differently: Inject 30 Units into the skin daily. ) 15 mL 0  . metFORMIN (GLUCOPHAGE) 1000 MG tablet Take 1 tablet (1,000 mg total) by mouth daily. (Patient taking differently: Take 1,000 mg by mouth 2 (two)  times daily. ) 30 tablet 0  . nitroGLYCERIN (NITROSTAT) 0.4 MG SL tablet Place 1 tablet (0.4 mg total) under the tongue every 5 (five) minutes as needed for chest pain.  12  . ondansetron (ZOFRAN) 4 MG tablet Take 1 tablet (4 mg total) by mouth every 8 (eight) hours as needed for nausea or vomiting. 12 tablet 0  . sertraline (ZOLOFT) 50 MG tablet Take 1 tablet (50 mg total) by mouth daily. For depression 30 tablet 0  . traZODone (DESYREL) 100 MG tablet Take 1 tablet (100 mg total) by mouth at bedtime as needed for sleep. 30 tablet 0    Musculoskeletal: Strength & Muscle Tone: within normal limits Gait & Station: normal Patient leans: N/A  Psychiatric Specialty Exam: Physical Exam  Nursing note and vitals reviewed. Constitutional: He is oriented to person, place, and time. He appears well-developed.  HENT:  Head: Normocephalic and atraumatic.  Neck: Normal range of motion.  Cardiovascular: Normal rate.  Respiratory: Effort normal.  Neurological: He is alert and oriented to person, place, and time.  Psychiatric: His speech is normal and behavior is normal. Cognition and memory are normal. He expresses impulsivity. He exhibits a depressed mood. He expresses suicidal ideation. He expresses suicidal plans (Plan to overdose on crack cocaine).    Review of Systems  Constitutional: Negative.   HENT: Negative.   Eyes: Negative.   Respiratory: Negative.   Cardiovascular: Negative.   Gastrointestinal: Negative.   Genitourinary: Negative.   Musculoskeletal: Negative.   Skin: Negative.   Neurological: Negative.   Endo/Heme/Allergies: Negative.   Psychiatric/Behavioral: Positive for depression, substance abuse and suicidal ideas.    Blood pressure (!) 152/92, pulse 83, temperature 99 F (37.2 C), temperature source Oral, resp. rate 20, SpO2  96 %.There is no height or weight on file to calculate BMI.  General Appearance: Casual  Eye Contact:  Fair  Speech:  Clear and Coherent  Volume:   Normal  Mood:  Depressed  Affect:  Congruent  Thought Process:  Coherent and Descriptions of Associations: Intact  Orientation:  Full (Time, Place, and Person)  Thought Content:  Logical  Suicidal Thoughts:  Yes.  without intent/plan  Homicidal Thoughts:  No  Memory:  Immediate;   Good Recent;   Good Remote;   Good  Judgement:  Fair  Insight:  Fair  Psychomotor Activity:  Normal  Concentration:  Concentration: Fair and Attention Span: Fair  Recall:  Good  Fund of Knowledge:  Good  Language:  Good  Akathisia:  No  Handed:  Right  AIMS (if indicated):   N/A  Assets:  Desire for Improvement  ADL's:  Intact  Cognition:  WNL  Sleep:   N/A     Treatment Plan Summary: Plan Will discharge with outpatient resources and shelter at this time. Patient with chronic substance abuse, depressive symptoms, and suicidal ideations. he is concerned about housing and will offer resources and place peer support consult. he may been[facility] from [facility] or [facility] for ongoing substance abuse treatment at this time.   Disposition: No evidence of imminent risk to self or others at present.   Patient does not meet criteria for psychiatric inpatient admission. Supportive therapy provided about ongoing stressors. Discussed crisis plan, support from social network, calling 911, coming to the Emergency Department, and calling Suicide Hotline.  This service was provided via telemedicine using a 2-way, interactive audio and video technology.  Names of all persons participating in this telemedicine service and their role in this encounter. Name: [name] Patient  Sheran Fava FNP-BC    Demographic Factors:  Male, Age 28 or older, Low socioeconomic status and Unemployed  Loss Factors: Financial problems/change in socioeconomic status  Historical Factors: NA  Risk Reduction Factors:   Sense of responsibility to family, Religious beliefs about death, Positive therapeutic relationship and  Positive coping skills or problem solving skills  Continued Clinical Symptoms:  Unstable or Poor Therapeutic Relationship Previous Psychiatric Diagnoses and Treatments  Cognitive Features That Contribute To Risk:  None    Suicide Risk:  Minimal: No identifiable suicidal ideation.  Patients presenting with no risk factors but with morbid ruminations; may be classified as minimal risk based on the severity of the depressive symptoms   Plan Of Care/Follow-up recommendations:  Activity:  Increase activity as tolerated Diet:  Heart healthy diet Tests:  ROutine tests as directed by outpatient phsyician Other:  Even if you begin to feel better. Please seek help for your substance abuse. Please review lsit of resources provided to you by Social work. I have included a peer support order for someone to follow up with you in the community.   Disposition: Discharge  Suella Broad, FNP 09/03/2019, 11:05 AM   Attest to NP Note

## 2019-09-03 NOTE — ED Notes (Signed)
Pt DCd off unit to home per provider. Pt alert, calm, cooperative, no s/s of distress.  DC information and resources given to and reviewed with pt, with pt understanding. Pt ambulatory with cane. Pt ambulated off the unit, escorted by NT. Pt using bus for transport, per pt.

## 2019-09-03 NOTE — BH Assessment (Signed)
[facility] Assessment Progress Note  Per Sheran Fava, PMHNP, this pt does not require psychiatric hospitalization at this time.  Pt is to be discharged from [facility] with referral information for area outpatient mental health and substance abuse treatment providers, as well as residential substance abuse treatment.  This has been included in pt's discharge instructions.  Farris Has also agrees to order a Peer Support consult for follow up with pt in the community.  Pt's nurse, Eustaquio Maize, has been notified.  Jalene Mullet, [place] Triage Specialist [phone]

## 2019-09-03 NOTE — ED Provider Notes (Signed)
Emergency Medicine Observation Re-evaluation Note  Michael [name] is a 68 y.o. male, seen on rounds today.  Pt initially presented to the ED for complaints of Suicidal Currently, the patient is awake and alert.  Physical Exam  BP 139/86 (BP Location: Right Arm)   Pulse 94   Temp 99 F (37.2 C) (Oral)   Resp 20   SpO2 96%  Physical Exam  ED Course / MDM  EKG:EKG Interpretation  Date/Time:  Thursday September 02 2019 08:54:51 EST Ventricular Rate:  91 PR Interval:    QRS Duration: 86 QT Interval:  348 QTC Calculation: 429 R Axis:   74 Text Interpretation: Sinus rhythm Nonspecific ST abnormality Confirmed by Lajean Saver [phone]) on 09/02/2019 10:59:25 AM    I have reviewed the labs performed to date as well as medications administered while in observation.  Recent changes in the last 24 hours include TTS consult. Plan  Current plan is for reassess by TTS this am. Patient is not under full IVC at this time.   Hayden Rasmussen, MD 09/03/19 1755

## 2019-09-10 ENCOUNTER — Other Ambulatory Visit: Payer: Self-pay | Admitting: *Deleted

## 2019-09-10 NOTE — Patient Outreach (Signed)
[place] [facility]) Care Management  09/10/2019  Michael [name] [birth year] [id number]   CSW made a third & final attempt reach patient to follow-up on referral but patient did not answer. Multiple phone calls have been made to the patient by [facility] team [facility], Pharmacist & this CSW) but no answer. He has also been sent unsuccessful outreach letters by multiple [place]. CSW will close case at this time.   Raynaldo Opitz, LCSW [facility]  Clinical Social Worker cell #: [phone]

## 2019-10-12 IMAGING — CT CT ABD-PELV W/ CM
2 of 5 series · 14 of 46 positions shown, 16 images · IV contrast (iopamidol)
Comparison: CT scan of February 22, 2018.

CLINICAL DATA: Moderate blunt trauma.

EXAM:
CT CHEST, ABDOMEN, AND PELVIS WITH CONTRAST
TECHNIQUE: Multidetector CT imaging of the chest, abdomen and pelvis was
performed following the standard protocol during bolus
administration of intravenous contrast.
CONTRAST:  100mL Z11PHY-VII IOPAMIDOL (Z11PHY-VII) INJECTION 61%

[Series 3: cap with · axial · 0.75mm/px · z∈[-787,-262]mm · 11 of 127 slices shown, 13 images]
[im 11/127  soft-tissue]
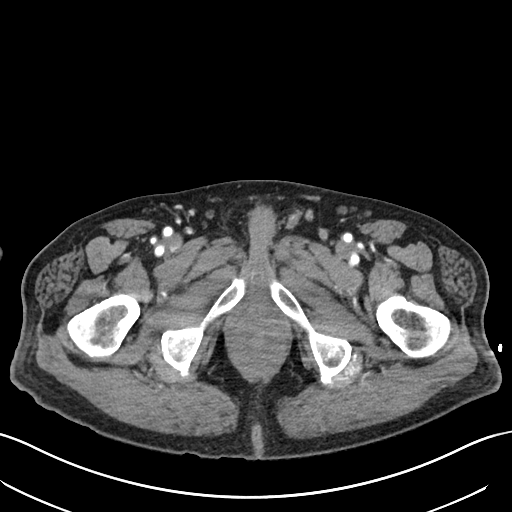
[im 11/127  bone]
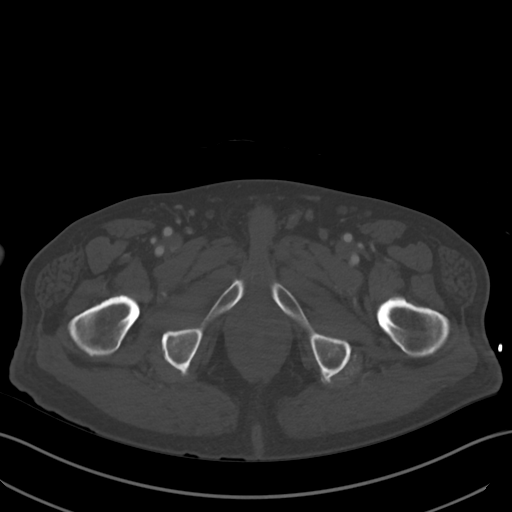
[im 22/127  soft-tissue]
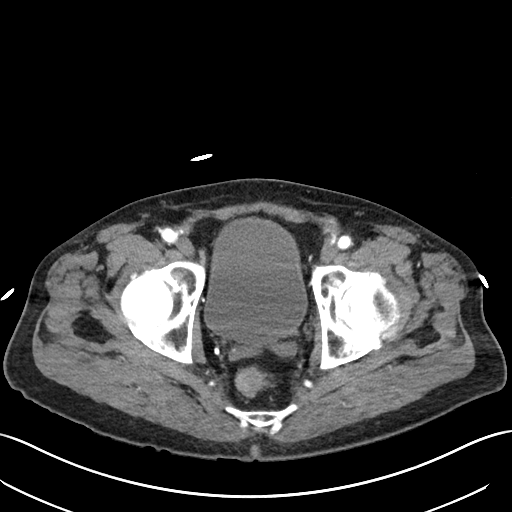
[im 32/127  soft-tissue]
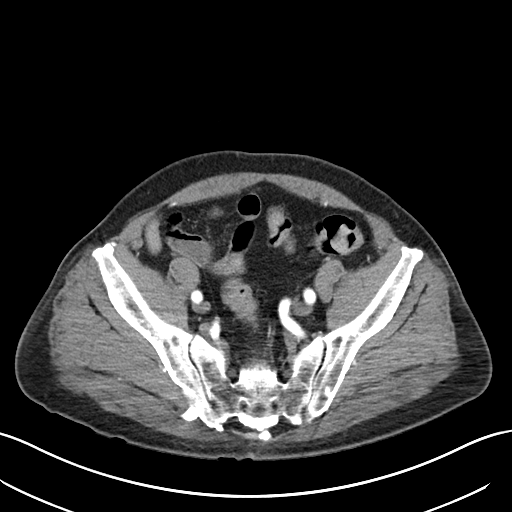
[im 43/127  soft-tissue]
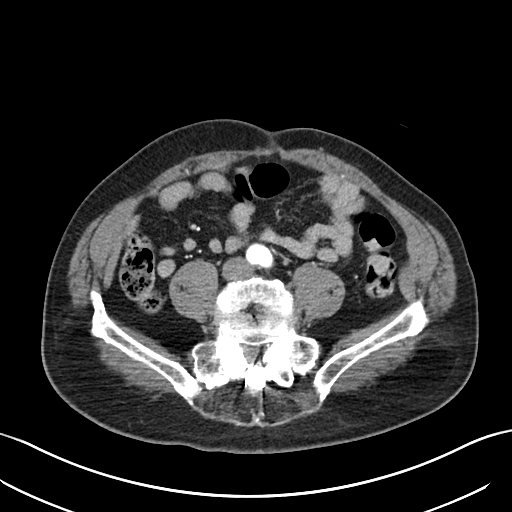
[im 53/127  soft-tissue]
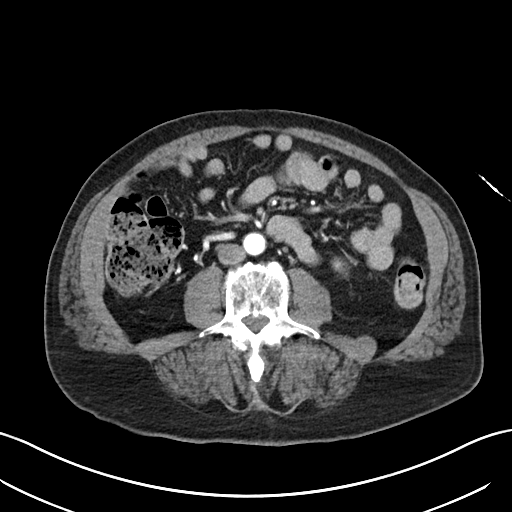
[im 64/127  soft-tissue]
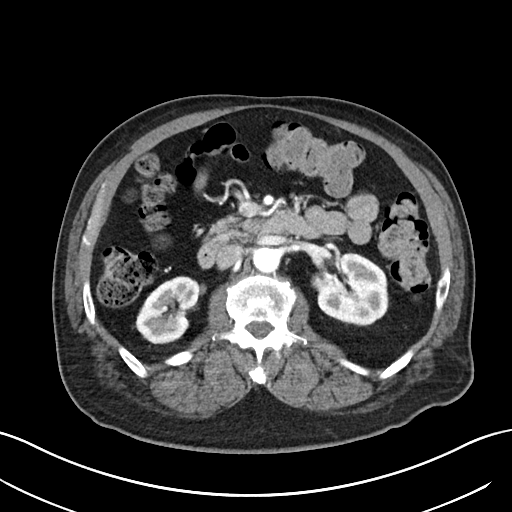
[im 74/127  soft-tissue]
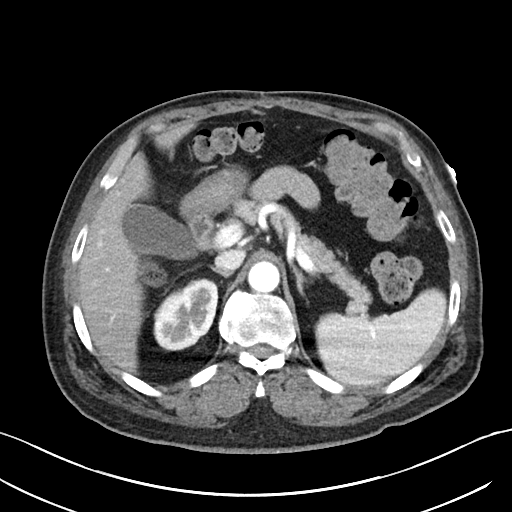
[im 85/127  soft-tissue]
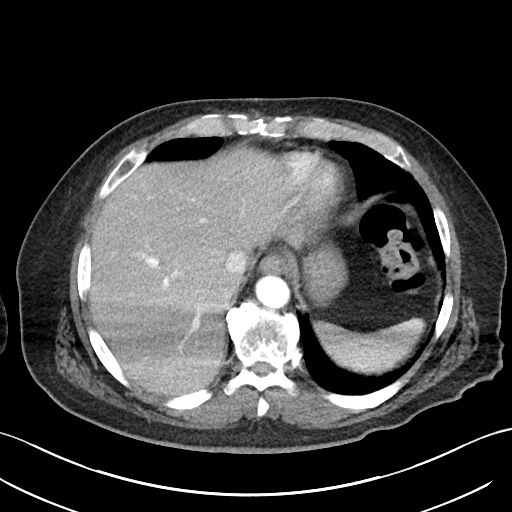
[im 95/127  soft-tissue]
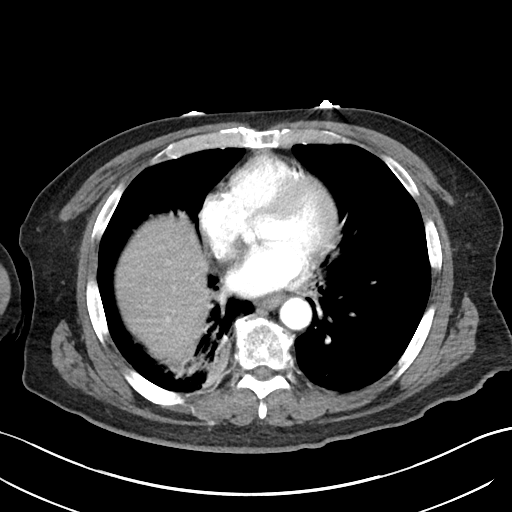
[im 95/127  bone]
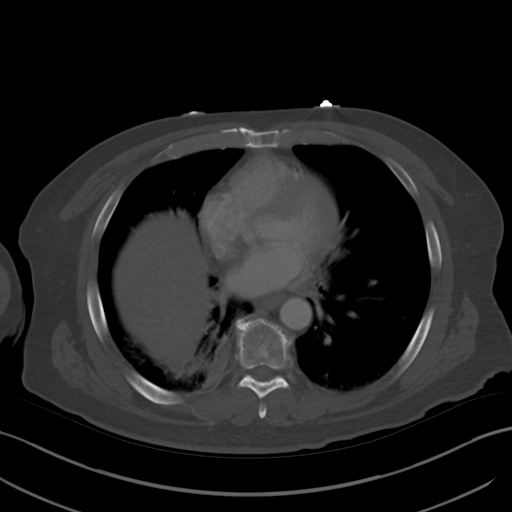
[im 106/127  soft-tissue]
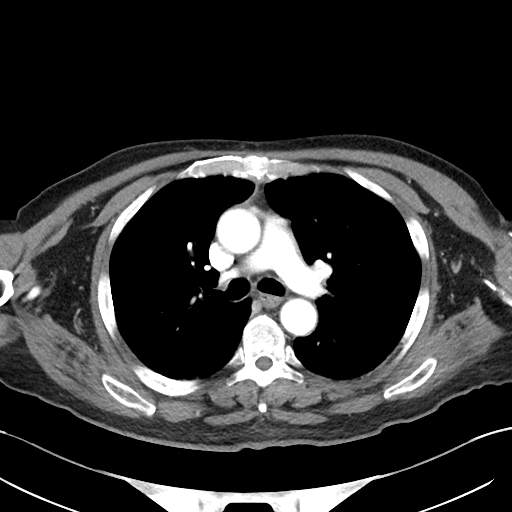
[im 116/127  soft-tissue]
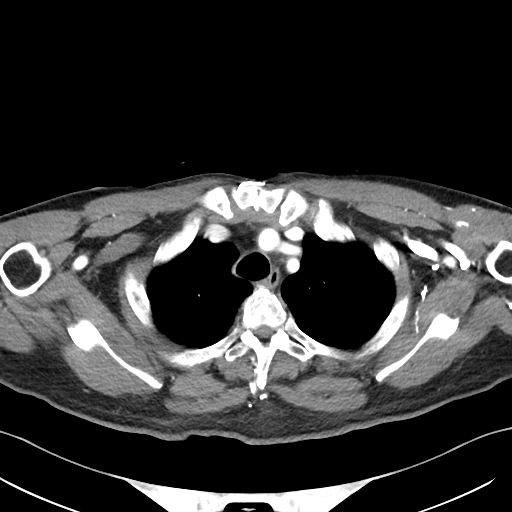

[Series 5: cor · coronal · 0.76mm/px · 3 of 120 slices shown]
[im 40/120  soft-tissue]
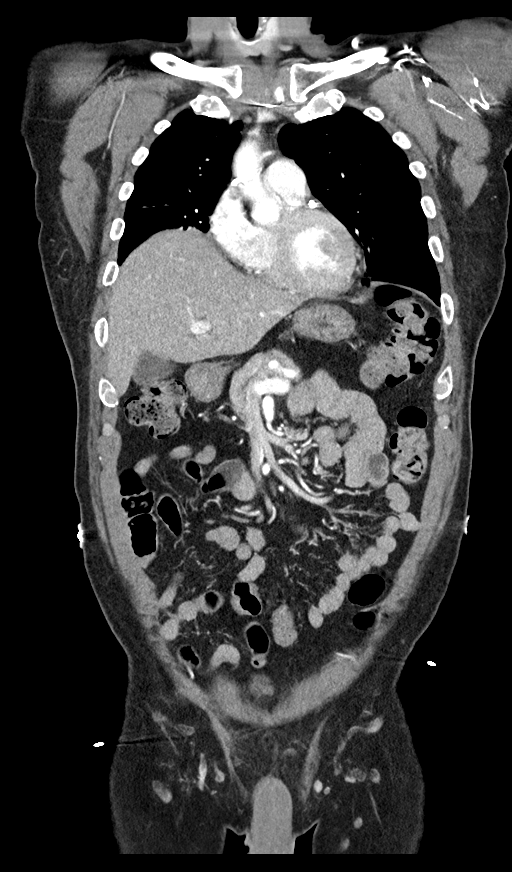
[im 53/120  soft-tissue]
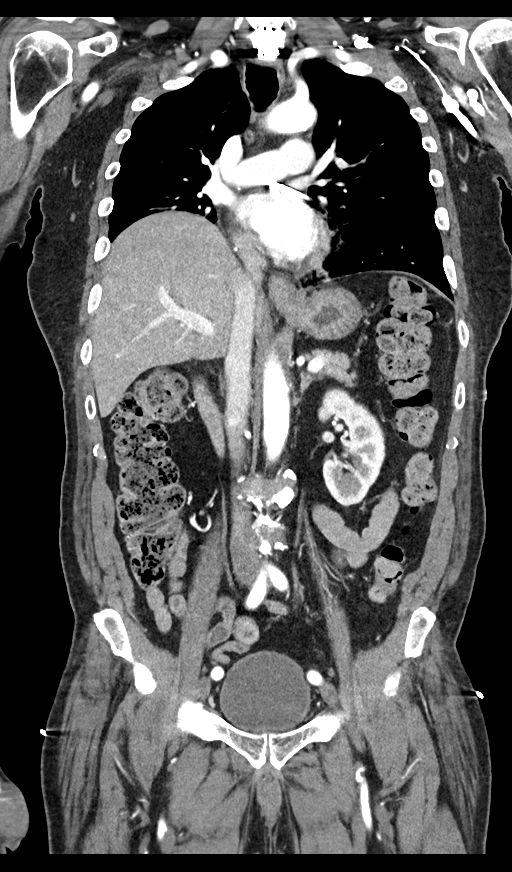
[im 67/120  soft-tissue]
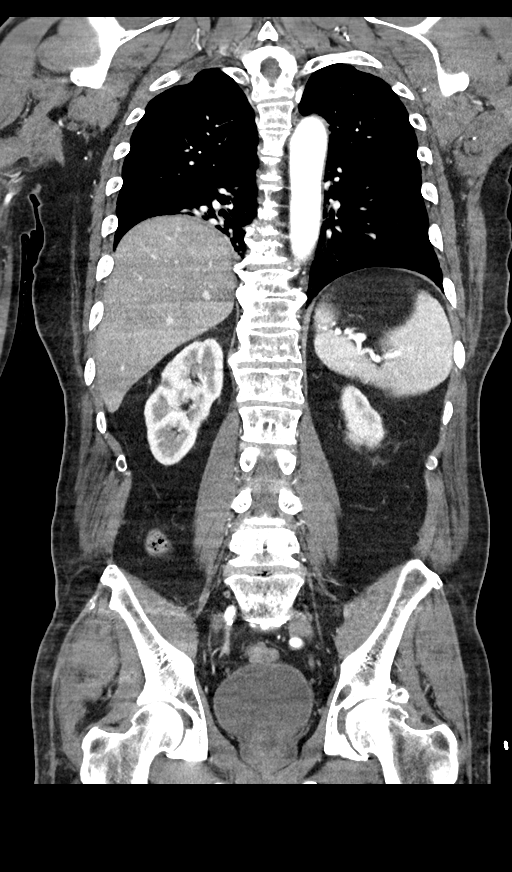

[14 of 46 positions shown; findings below may reference images not displayed]

FINDINGS: CT CHEST FINDINGS

Cardiovascular: No evidence of thoracic aortic dissection or
aneurysm. Status post coronary artery bypass graft. Normal cardiac
size. No pericardial effusion is noted.

Mediastinum/Nodes: No enlarged mediastinal, hilar, or axillary lymph
nodes. Thyroid gland, trachea, and esophagus demonstrate no
significant findings.

Lungs/Pleura: No pneumothorax or pleural effusion is noted. Mild
right posterior basilar subsegmental atelectasis or infiltrate is
noted. Left lung is clear.

Musculoskeletal: No chest wall mass or suspicious bone lesions
identified.

CT ABDOMEN PELVIS FINDINGS

Hepatobiliary: No focal liver abnormality is seen. No gallstones,
gallbladder wall thickening, or biliary dilatation.

Pancreas: Unremarkable. No pancreatic ductal dilatation or
surrounding inflammatory changes.

Spleen: Normal in size without focal abnormality.

Adrenals/Urinary Tract: Adrenal glands are unremarkable. Kidneys are
normal, without renal calculi, focal lesion, or hydronephrosis.
Bladder is unremarkable.

Stomach/Bowel: Stomach is within normal limits. Appendix appears
normal. No evidence of bowel wall thickening, distention, or
inflammatory changes.

Vascular/Lymphatic: No significant vascular findings are present. No
enlarged abdominal or pelvic lymph nodes.

Reproductive: Prostate is unremarkable.

Other: No abdominal wall hernia or abnormality. No abdominopelvic
ascites.

Musculoskeletal: No acute or significant osseous findings.
IMPRESSION: No evidence of traumatic injury seen in the chest, abdomen or
pelvis.

Mild right posterior basilar subsegmental atelectasis or infiltrate
is noted.

No other abnormality is noted.

## 2019-10-12 IMAGING — CT CT CERVICAL SPINE W/O CM
3 of 4 series · 12 of 33 positions shown, 14 images · non-contrast
Comparison: Head CT and cervical spine CT December 16, 2016

CLINICAL DATA: Pain following fall

EXAM:
CT HEAD WITHOUT CONTRAST
CT CERVICAL SPINE WITHOUT CONTRAST
TECHNIQUE: Multidetector CT imaging of the head and cervical spine was
performed following the standard protocol without intravenous
contrast. Multiplanar CT image reconstructions of the cervical spine
were also generated.

[Series 4: head bone · axial · 0.47mm/px · z∈[-110,+10]mm · 4 of 90 slices shown, 5 images]
[im 20/90  soft-tissue]
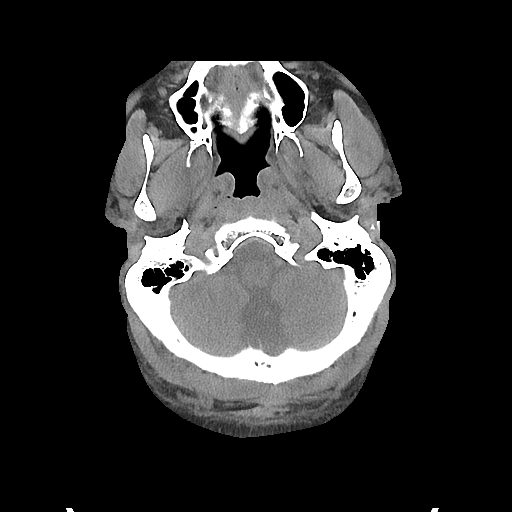
[im 20/90  bone]
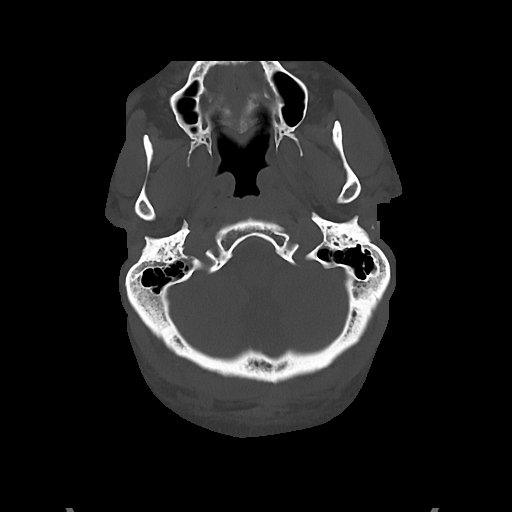
[im 40/90  bone]
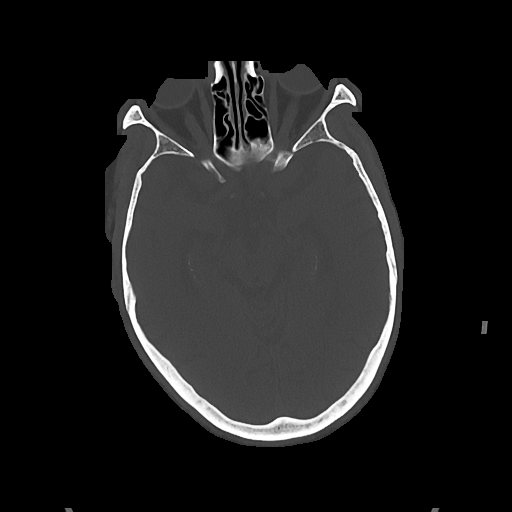
[im 60/90  bone]
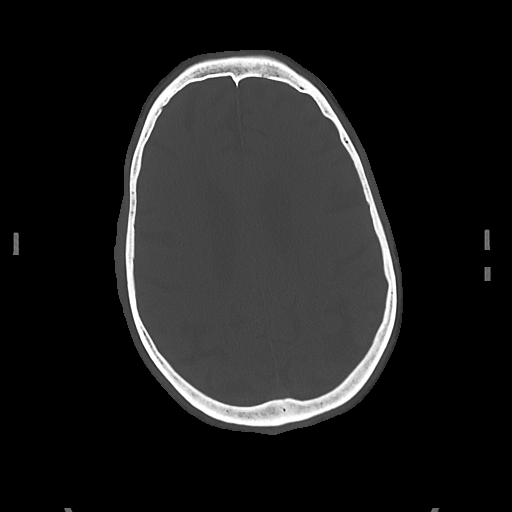
[im 80/90  bone]
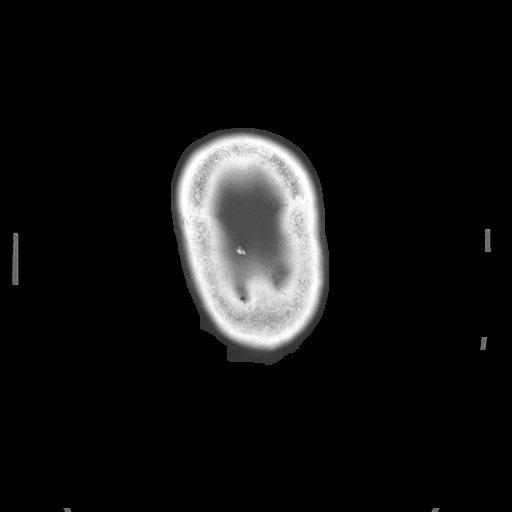

[Series 5: cor soft · coronal · 0.35mm/px · 3 of 87 slices shown]
[im 18/87  bone]
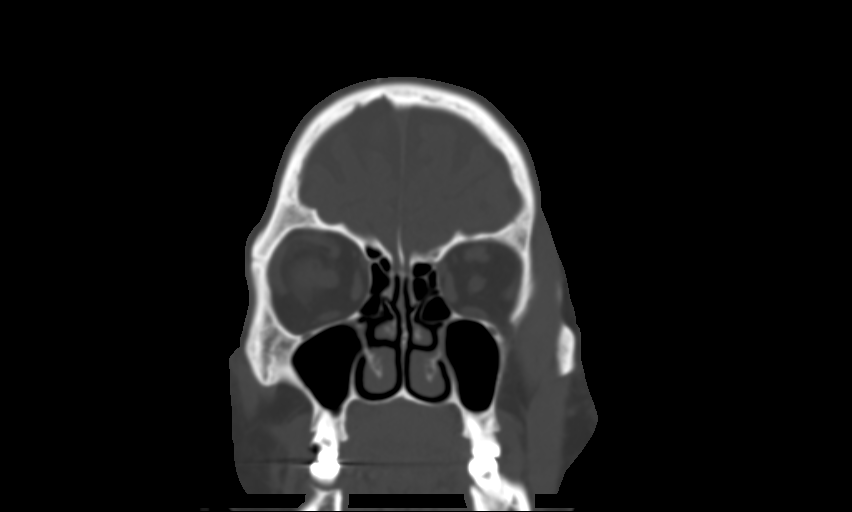
[im 35/87  bone]
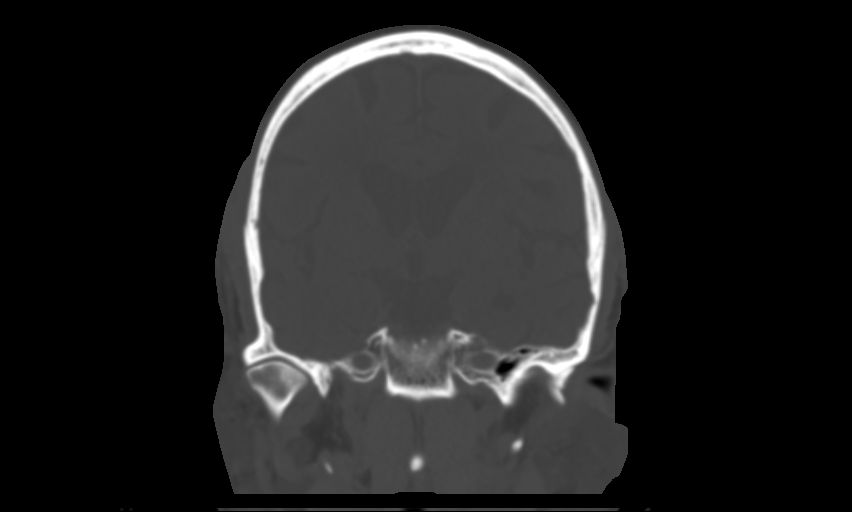
[im 52/87  bone]
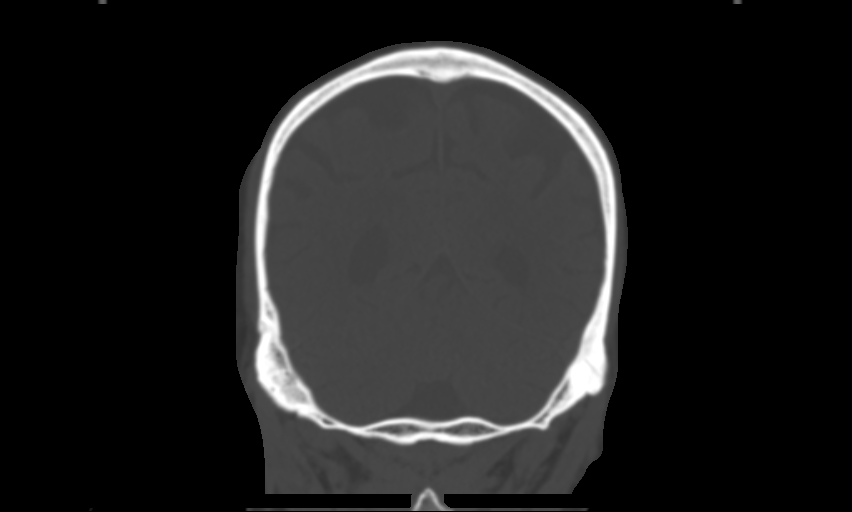

[Series 6: sag soft · sagittal · 0.46mm/px · 5 of 67 slices shown, 6 images]
[im 23/67  bone]
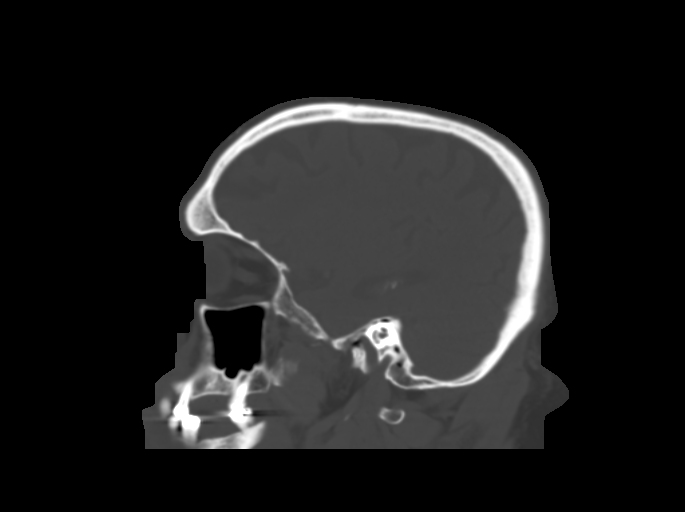
[im 28/67  bone]
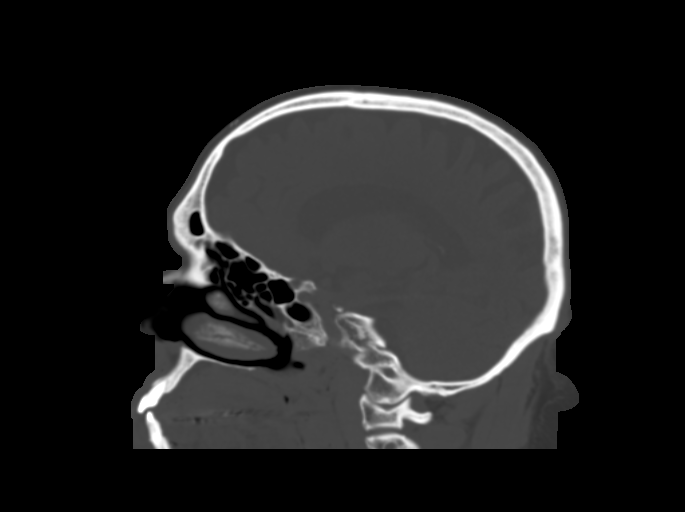
[im 34/67  soft-tissue]
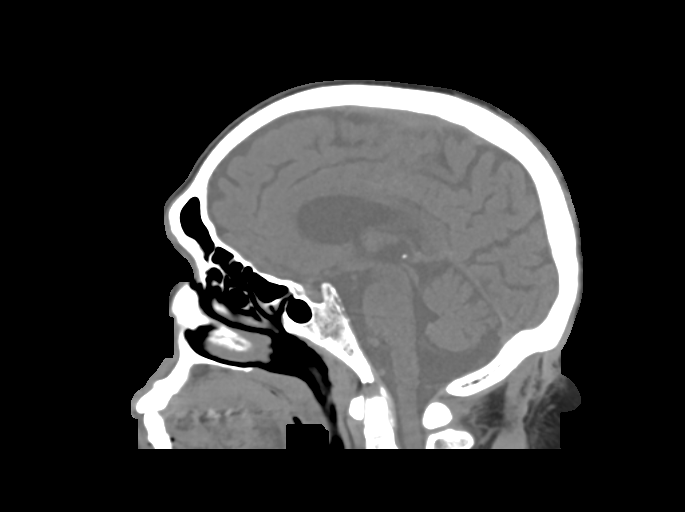
[im 34/67  bone]
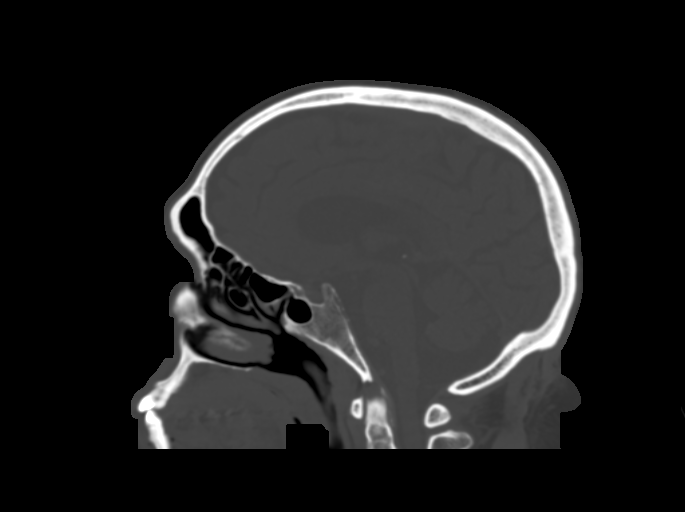
[im 39/67  bone]
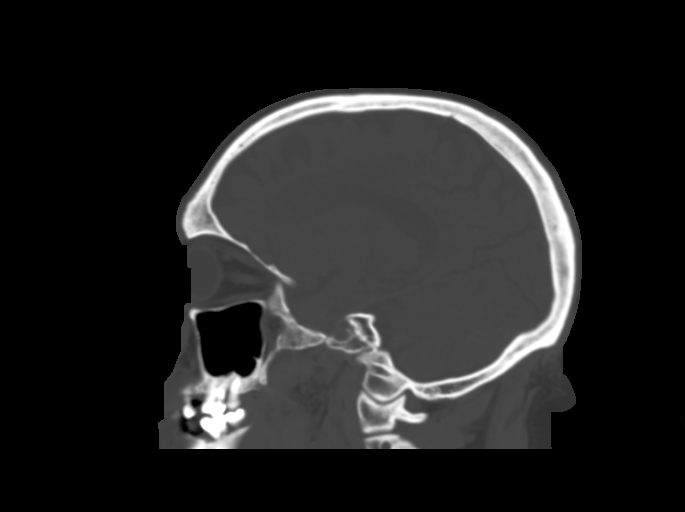
[im 45/67  bone]
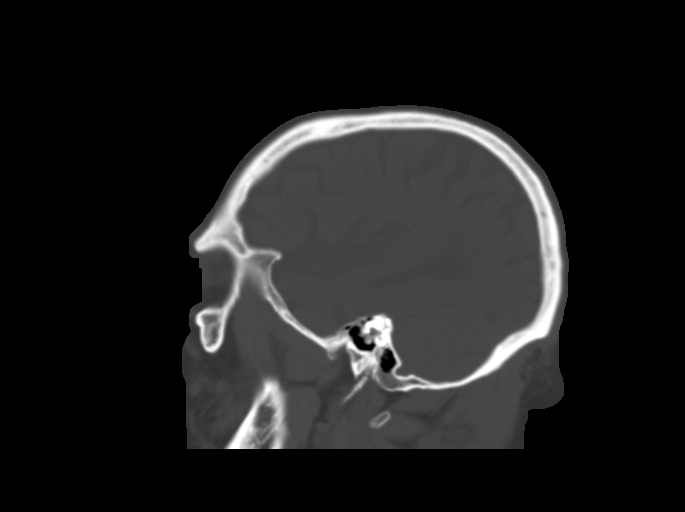

[12 of 33 positions shown; findings below may reference images not displayed]

FINDINGS: CT HEAD FINDINGS

Brain: Mild diffuse atrophy is stable. There is a 7 x 6 mm lipoma in
the midline at the level of the third ventricle, stable. No other
mass is seen. There is no hemorrhage, extra-axial fluid collection,
or midline shift. There is patchy small vessel disease in the centra
semiovale bilaterally. No acute infarct is demonstrable on this
study.

Vascular: There is no appreciable hyperdense vessel. There is mild
calcification in the left carotid siphon region.

Skull: The bony calvarium appears intact.

Sinuses/Orbits: There is mucosal thickening involving several
ethmoid air cells. Orbits appear symmetric bilaterally.

Other: Mastoid air cells are clear.

CT CERVICAL SPINE FINDINGS

Alignment: There is no appreciable spondylolisthesis.

Skull base and vertebrae: Skull base and craniocervical junction
regions appear normal. The patient is status post anterior screw and
plate fixation from C3-C7 with support hardware intact. No fracture
evident. No blastic or lytic bone lesions.

Soft tissues and spinal canal: Prevertebral soft tissues and
predental space regions are normal. No paraspinous lesions are
evident. There is no evident cord or canal hematoma.

Disc levels: There is ankylosis at C3-4, C4-5, C5-6, and C6-7. There
is moderate disc space narrowing at C7-T1. There is facet
hypertrophy at multiple levels. There is no frank disc extrusion or
stenosis on this study.

Upper chest: The visualized upper lung zones are clear.

Other: There are foci of carotid artery calcification bilaterally.
IMPRESSION: CT head: Stable atrophy with periventricular small vessel disease.
No acute infarct. Stable subcentimeter lipoma at the level of the
third ventricle in the midline, not causing hydrocephalus. No other
mass. No hemorrhage.

Mild arterial vascular calcification noted. There is mucosal
thickening involving several ethmoid air cells.

CT cervical spine: No fracture or spondylolisthesis. Postoperative
change from C3-C7 with support hardware intact. Multilevel
arthropathy noted without disc extrusion or stenosis evident. Foci
of carotid artery calcification noted bilaterally.

## 2019-10-26 IMAGING — DX DG CHEST 2V
2 series · 2 of 2 positions shown · non-contrast
Comparison: 06/08/2018 and chest CT dated 09/02/2018.

CLINICAL DATA: Mildly productive cough. Shortness of breath.

EXAM:
CHEST - 2 VIEW

[chest pa]
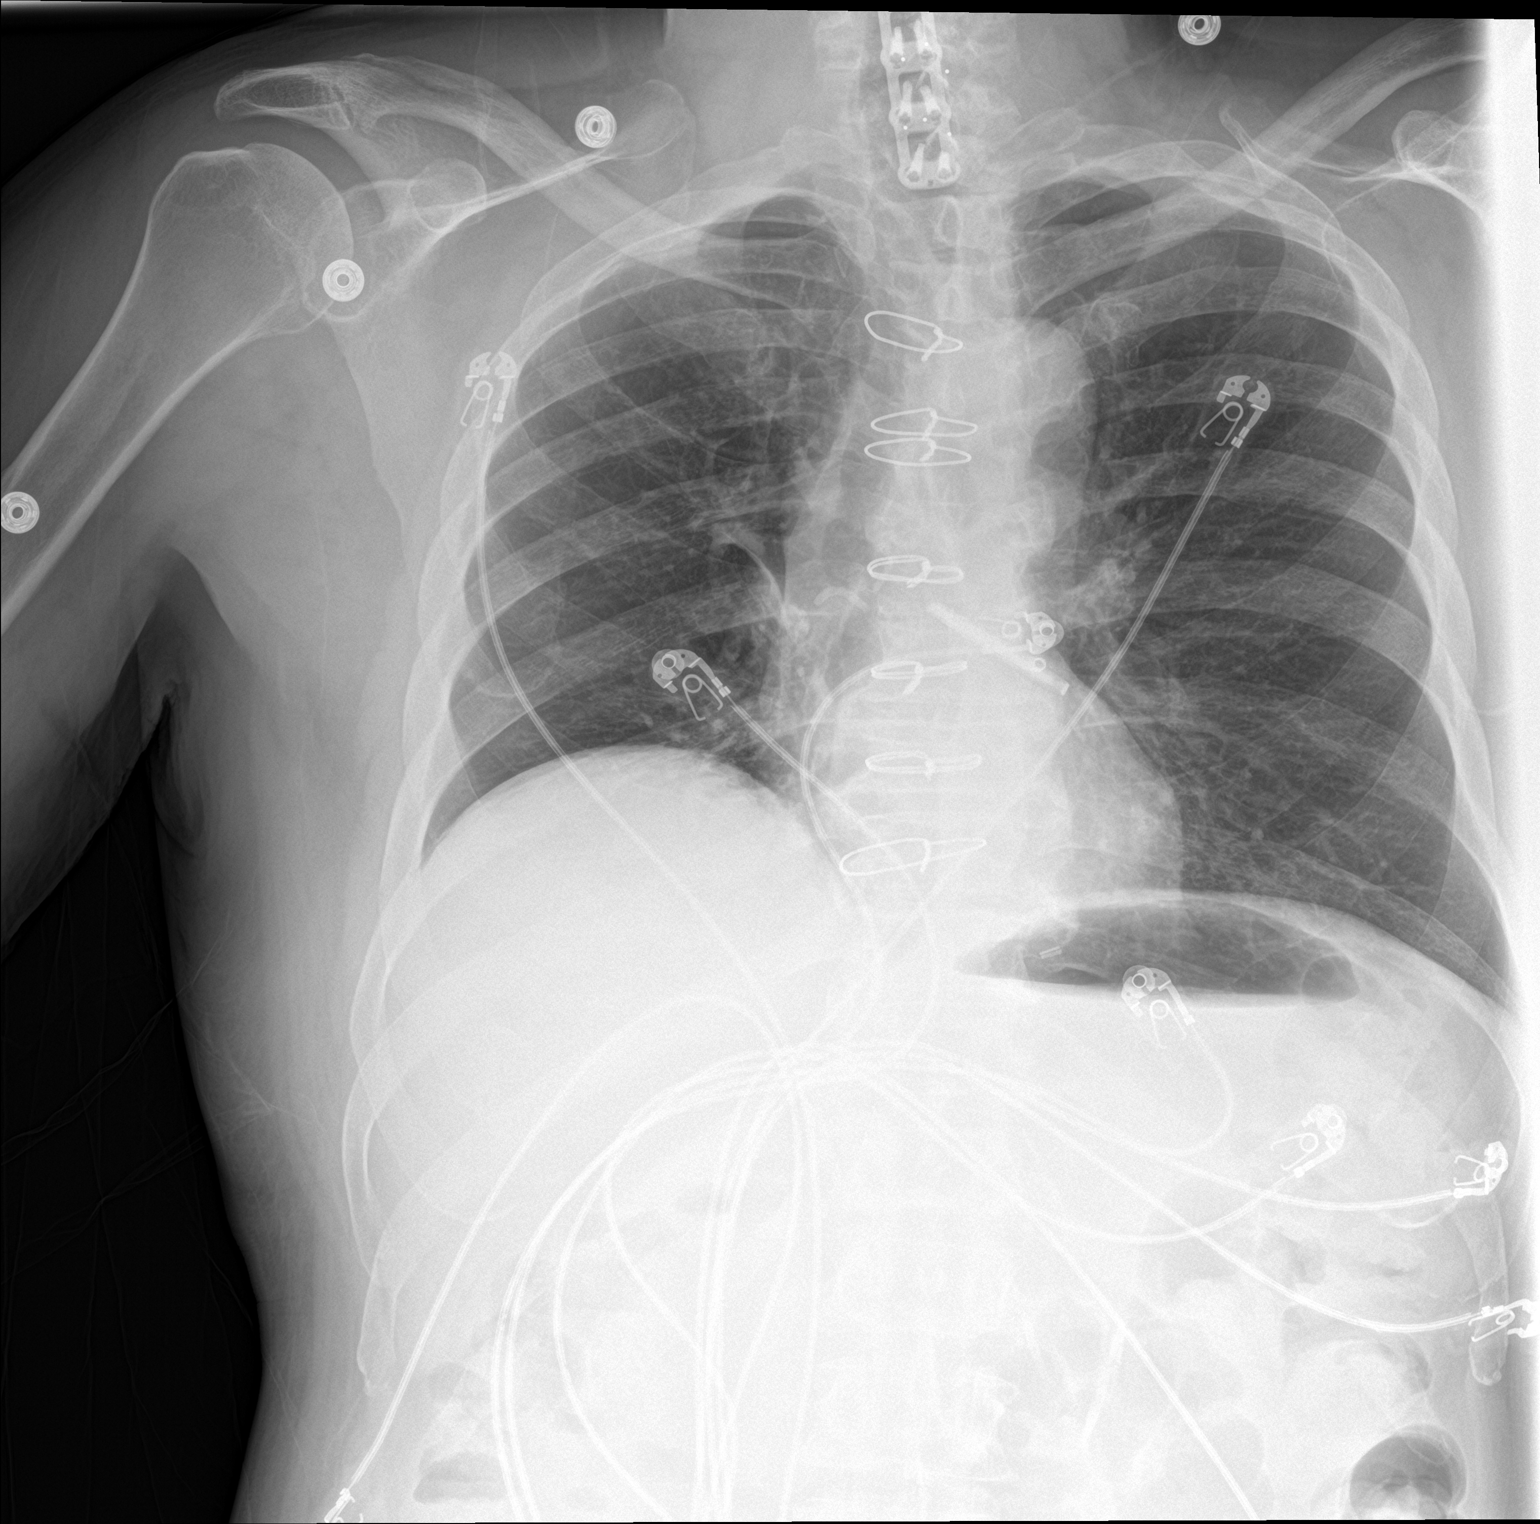

[chest lat]
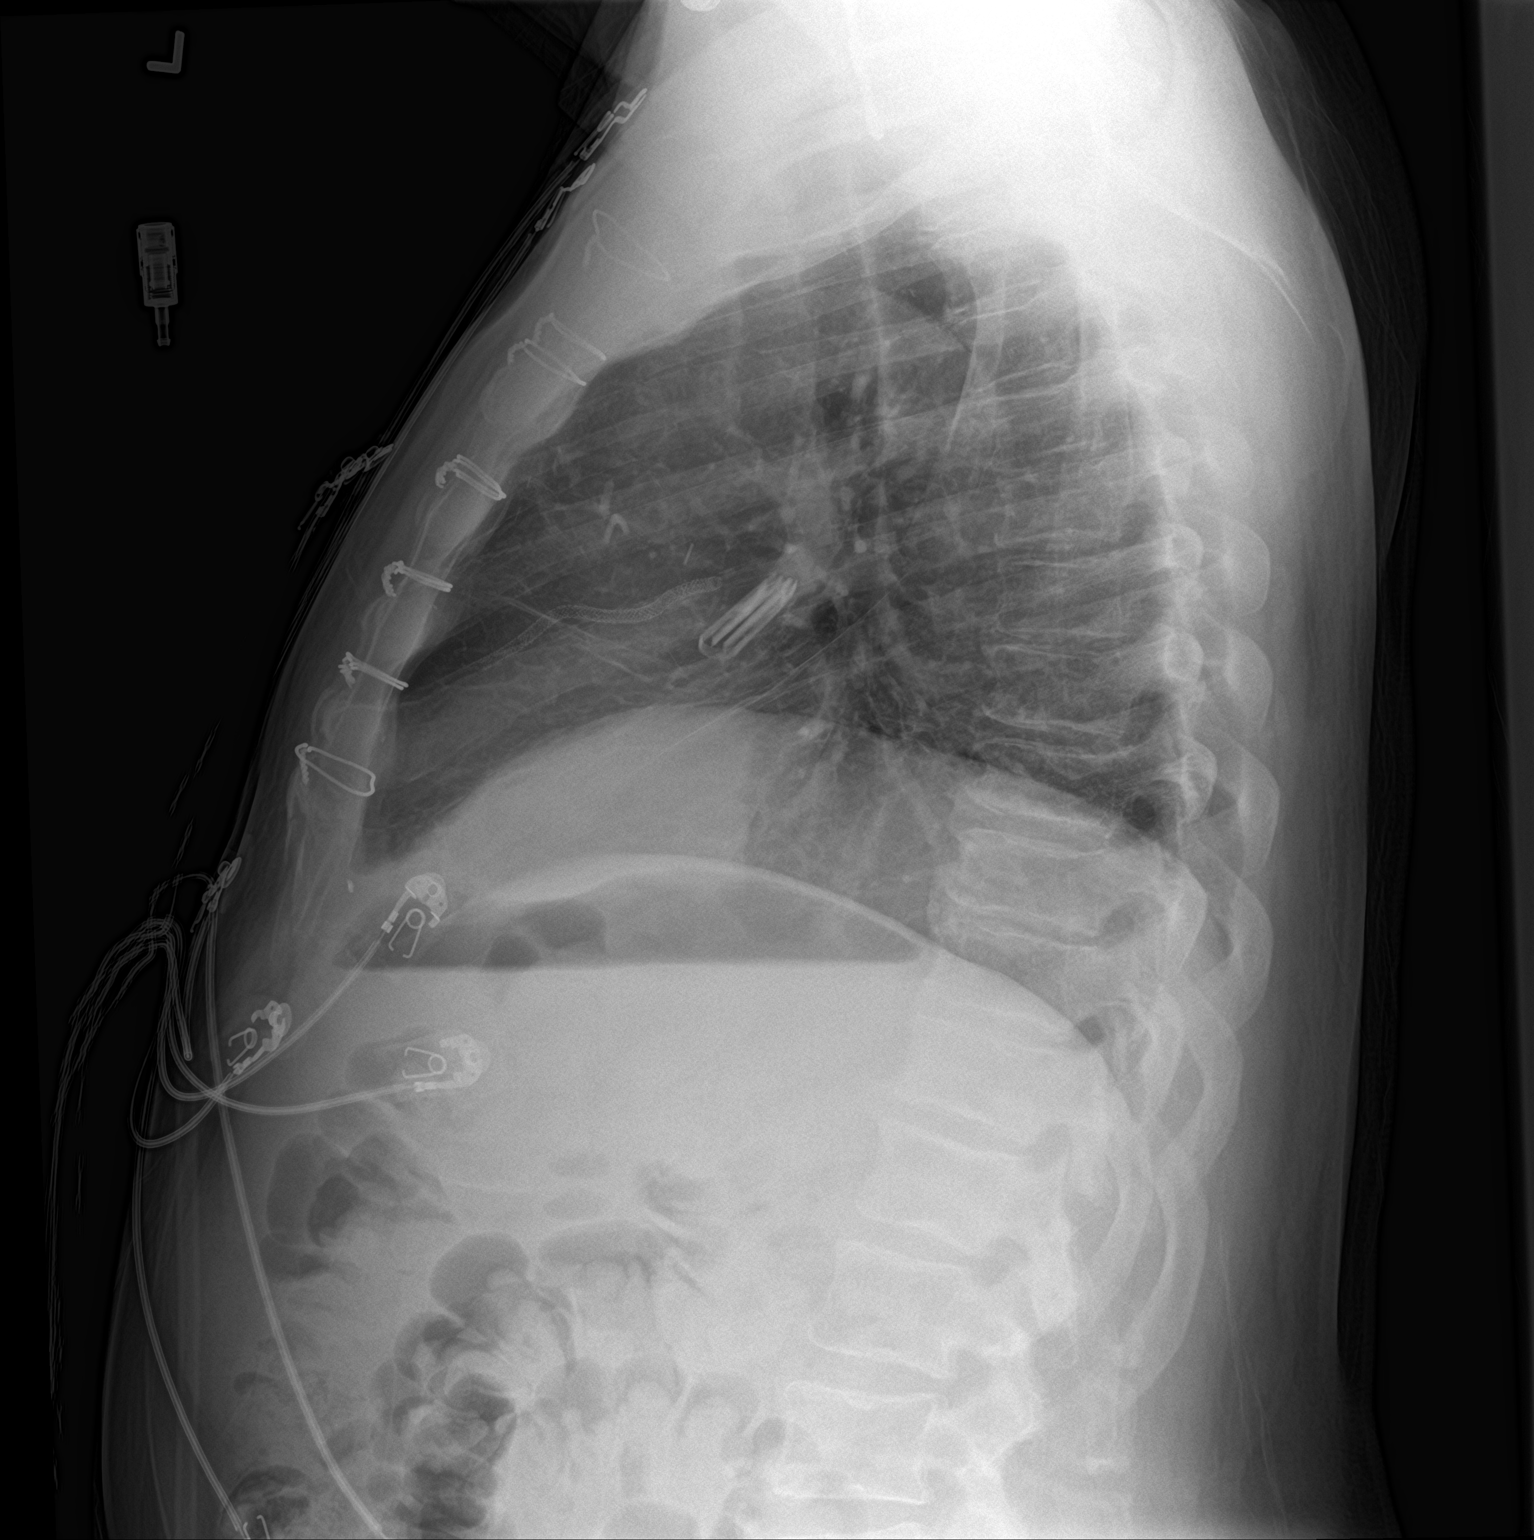

[2 of 2 positions shown; findings below may reference images not displayed]

FINDINGS: Normal sized heart. Tortuous aorta. Stable left atrial appendage
clip and coronary artery stent. Post CABG changes are again
demonstrated. Stable small amount of linear scarring in the lingula
or right middle lobe and small amount of linear scarring in the
right perihilar region. Cervical spine fixation hardware.
IMPRESSION: No acute abnormality.

## 2019-11-11 IMAGING — CR DG CHEST 2V
2 series · 2 of 2 positions shown · non-contrast
Comparison: PA and lateral chest 09/29/2018, 09/16/2018 and
02/20/2018.

CLINICAL DATA: Cough, fever and shortness of breath today.

EXAM:
CHEST - 2 VIEW

[chest lat]
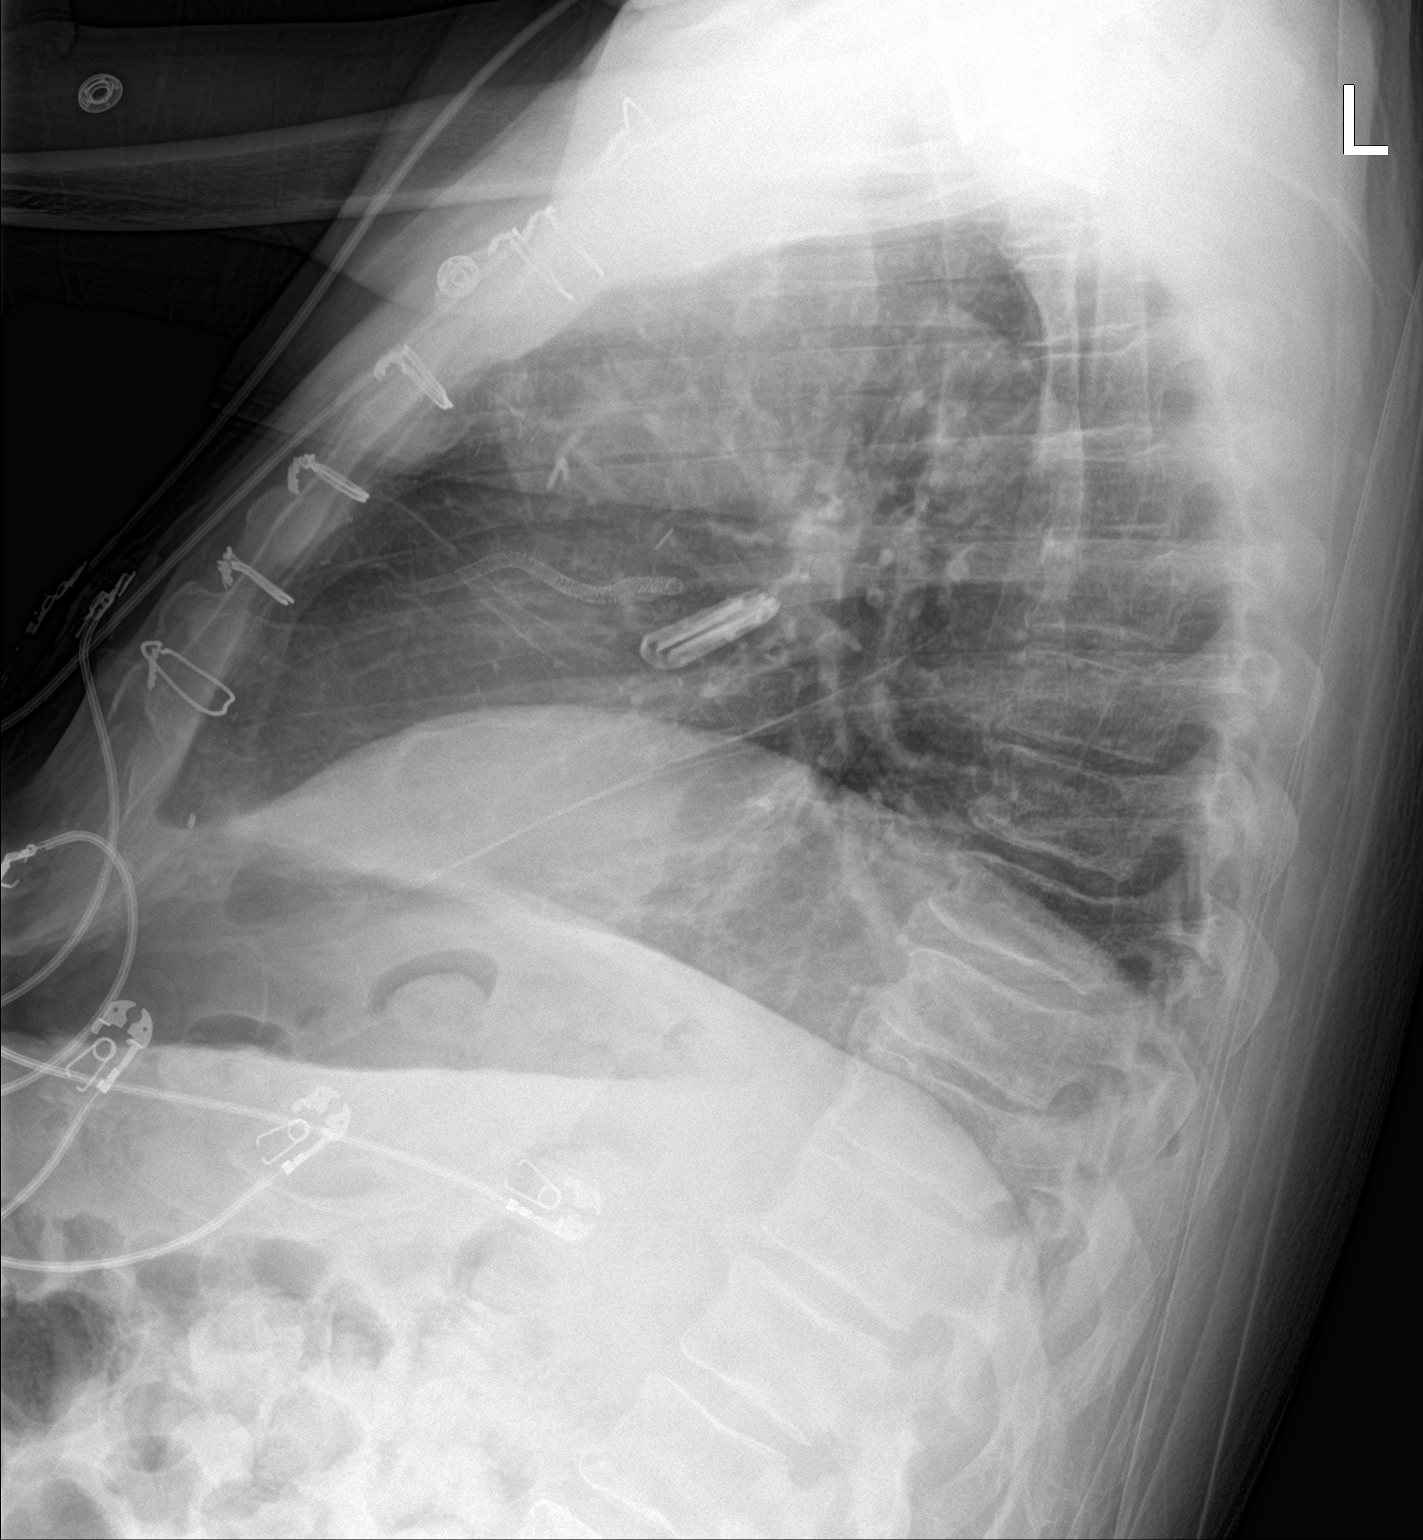

[chest ap]
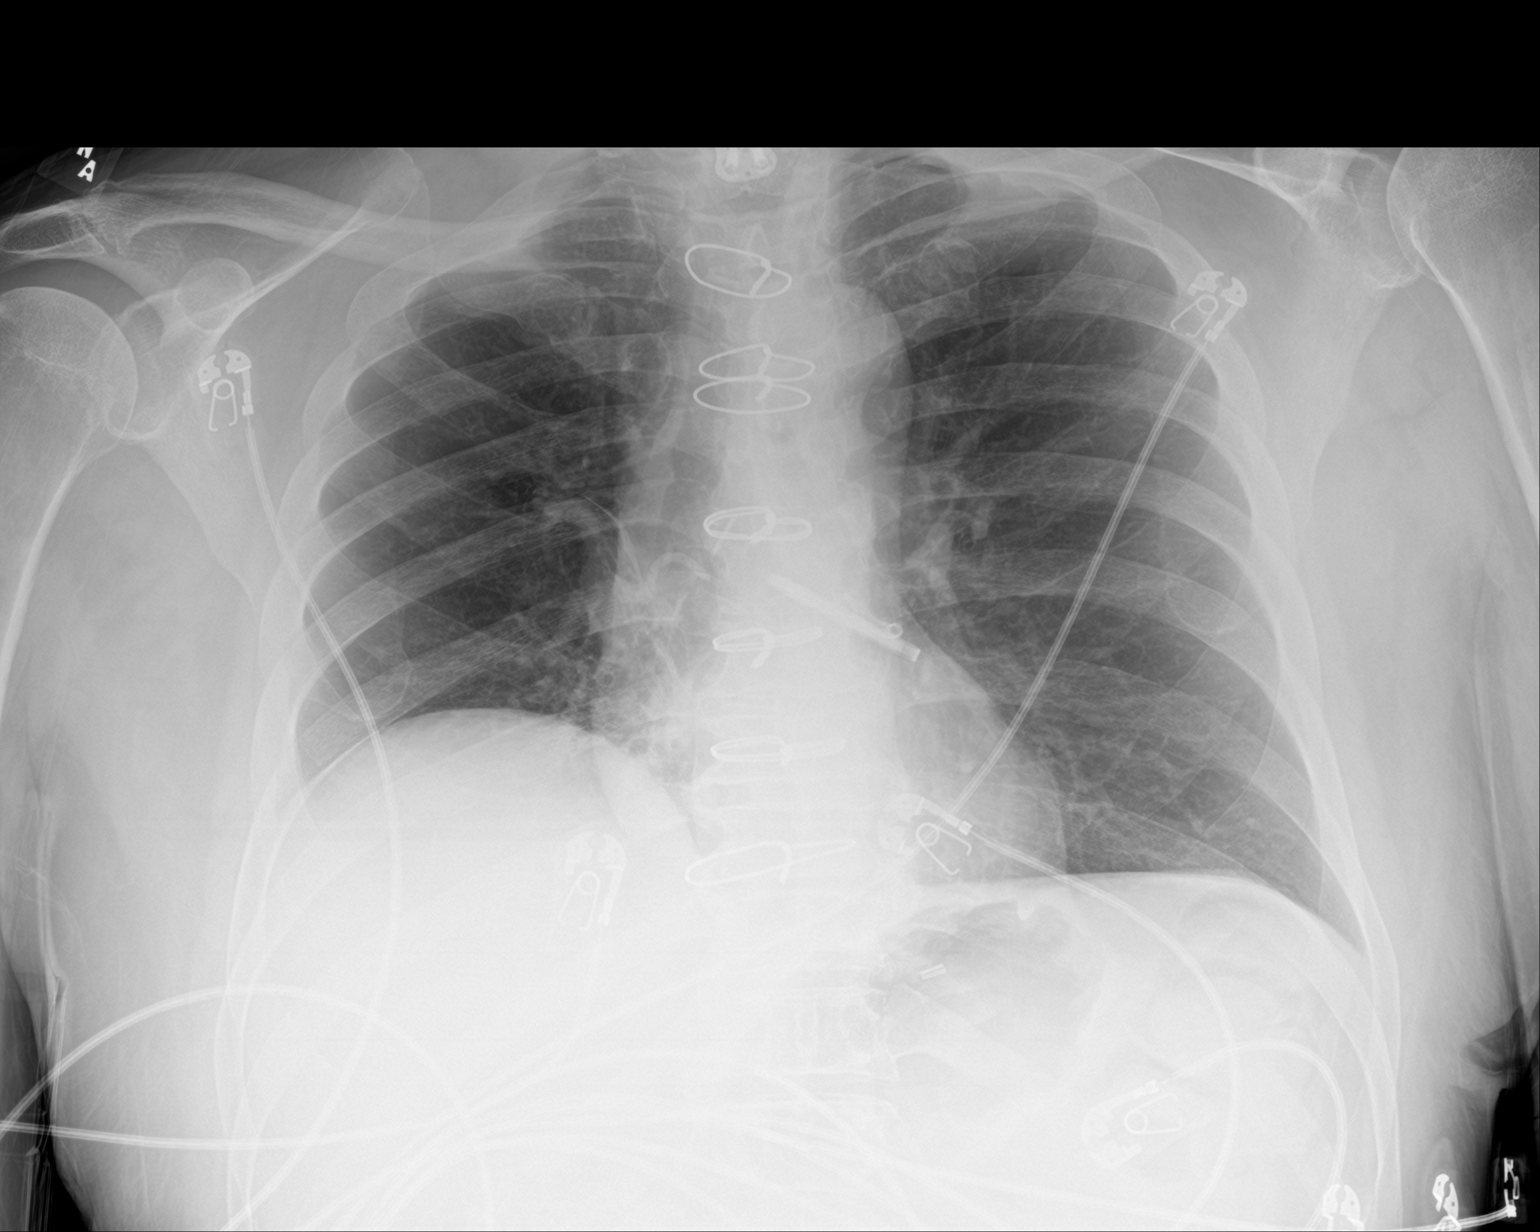

[2 of 2 positions shown; findings below may reference images not displayed]

FINDINGS: Seven intact median sternotomy wires and atrial appendage clip are
unchanged. Mild elevation of the right hemidiaphragm is also
unchanged. Lungs are clear. Heart size is normal. No pneumothorax or
pleural effusion. No acute or focal bony abnormality.
IMPRESSION: No acute disease.

## 2019-11-11 IMAGING — CT CT ANGIO CHEST
2 of 8 series · 18 of 46 positions shown · IV contrast (iopamidol)
Comparison: None.

CLINICAL DATA: Sore throat and cough since [REDACTED].

EXAM:
CT ANGIOGRAPHY CHEST WITH CONTRAST
TECHNIQUE: Multidetector CT imaging of the chest was performed using the
standard protocol during bolus administration of intravenous
contrast. Multiplanar CT image reconstructions and MIPs were
obtained to evaluate the vascular anatomy.
CONTRAST:  100mL [id number] IOPAMIDOL ([id number]) INJECTION 76%

[Series 6: thins · axial · 0.83mm/px · z∈[+1129,+1361]mm · 15 of 256 slices shown]
[im 12/256  lung]
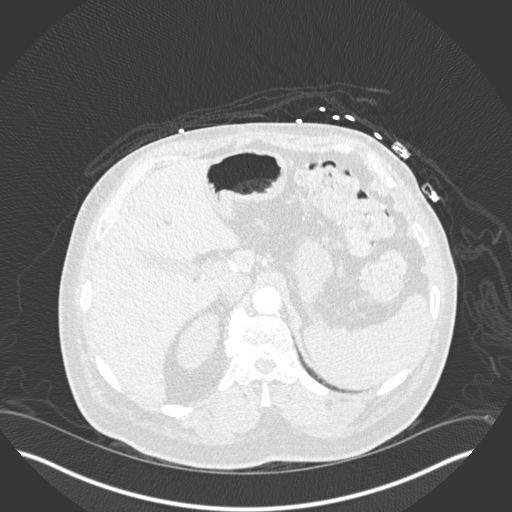
[im 35/256  soft-tissue]
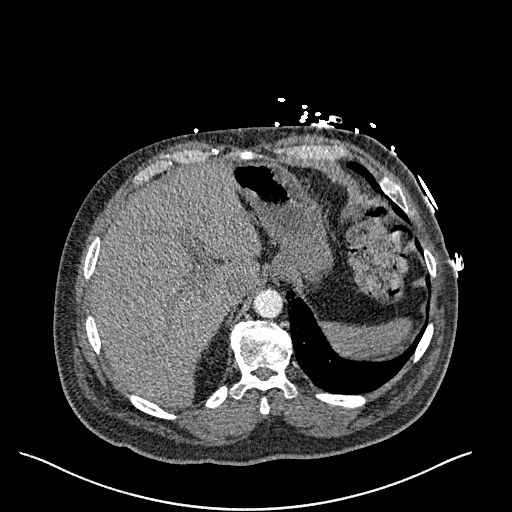
[im 47/256  lung]
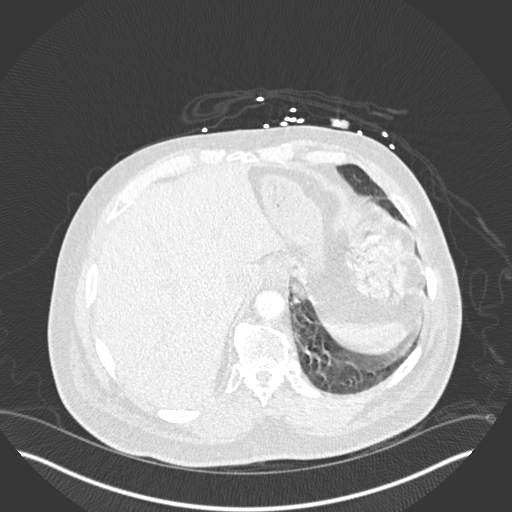
[im 58/256  soft-tissue]
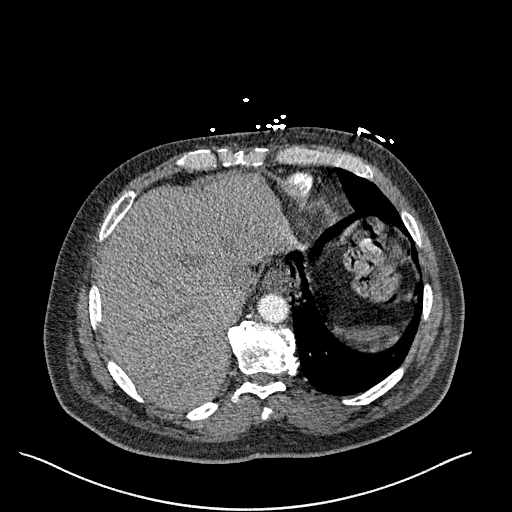
[im 82/256  lung]
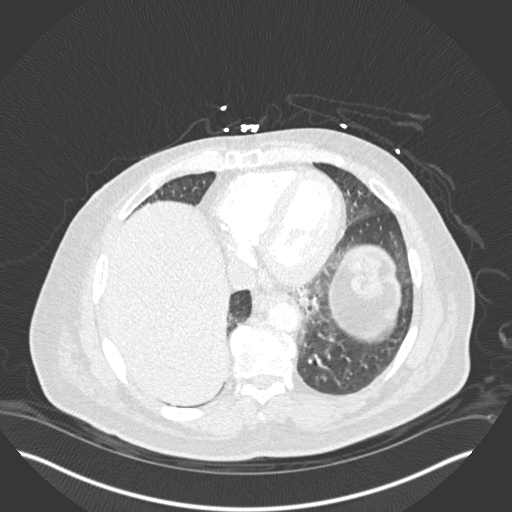
[im 93/256  soft-tissue]
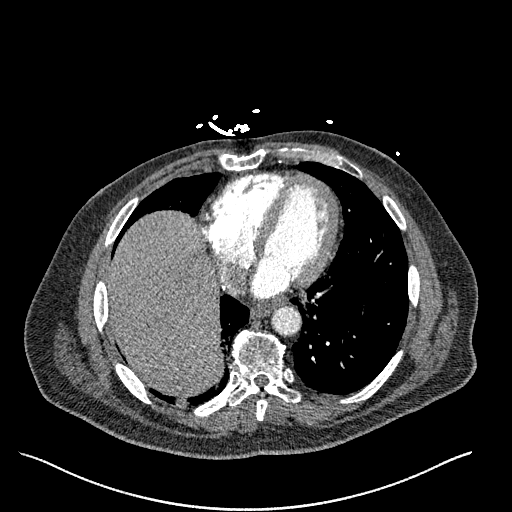
[im 116/256  lung]
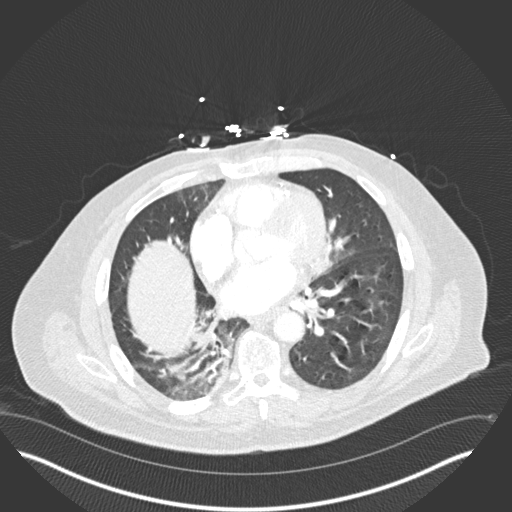
[im 128/256  soft-tissue]
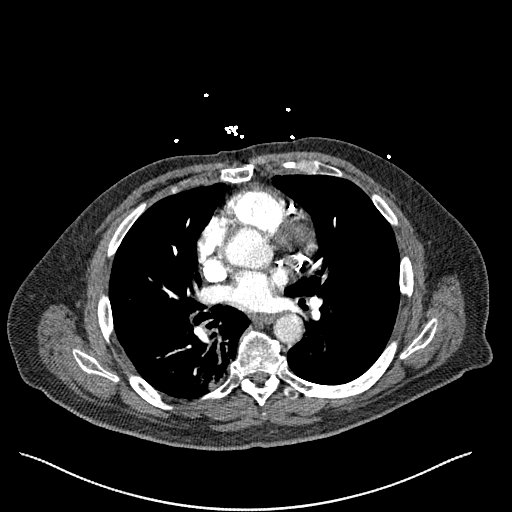
[im 140/256  lung]
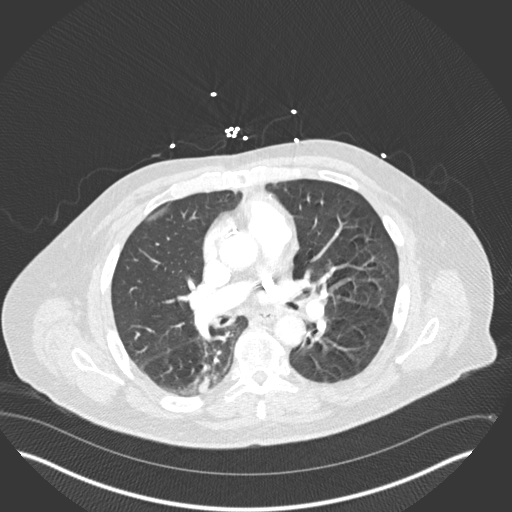
[im 163/256  soft-tissue]
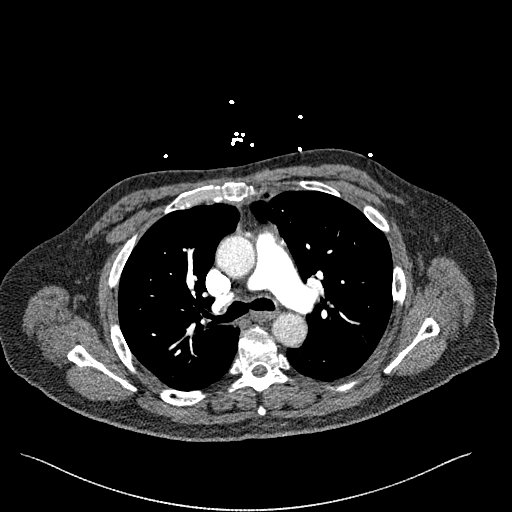
[im 174/256  lung]
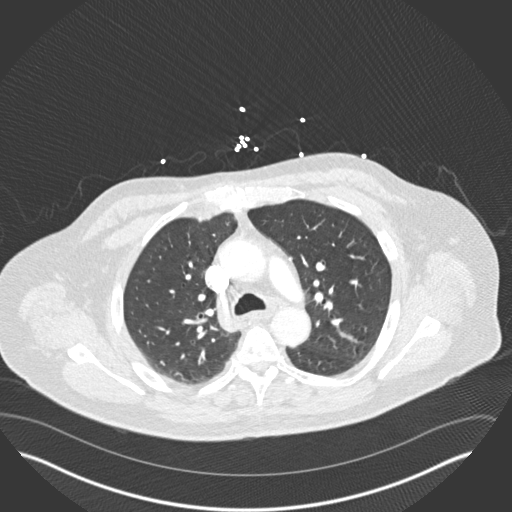
[im 198/256  soft-tissue]
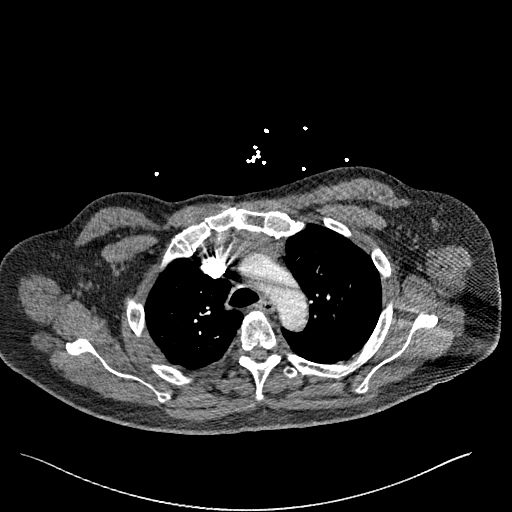
[im 209/256  lung]
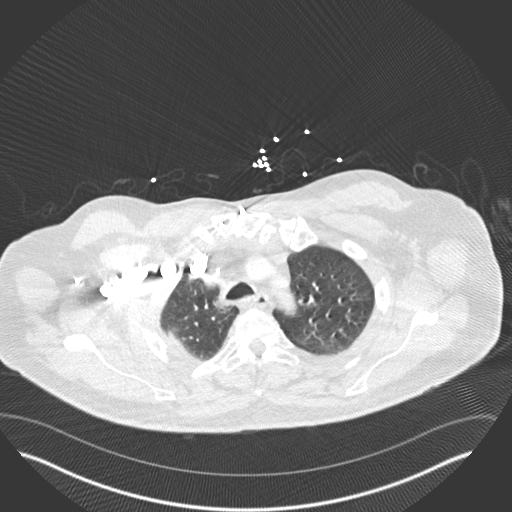
[im 221/256  soft-tissue]
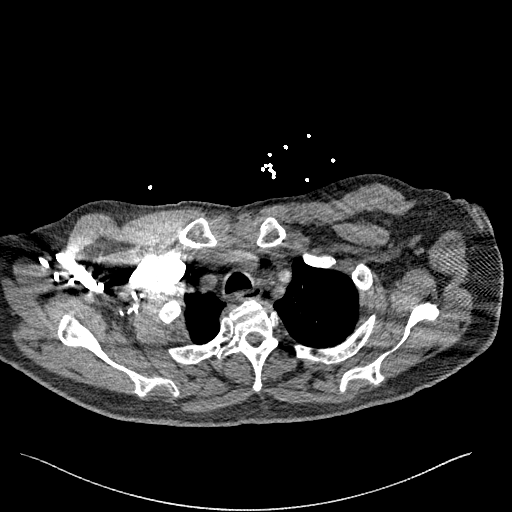
[im 244/256  lung]
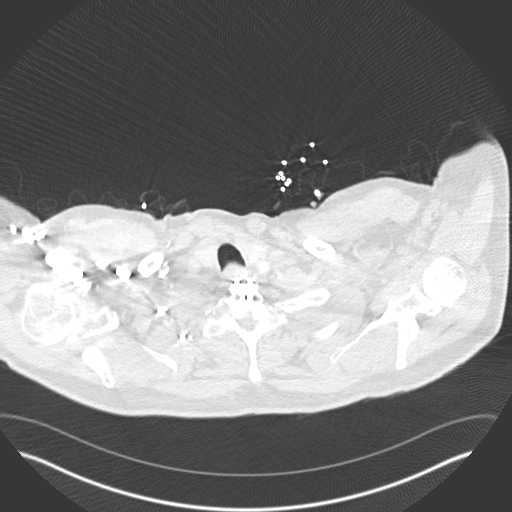

[Series 8: coronal mpr · coronal · 0.53mm/px · 3 of 151 slices shown]
[im 38/151  soft-tissue]
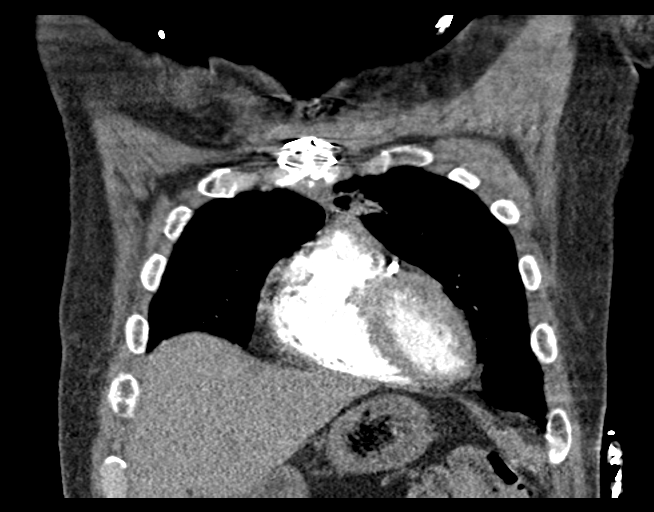
[im 76/151  soft-tissue]
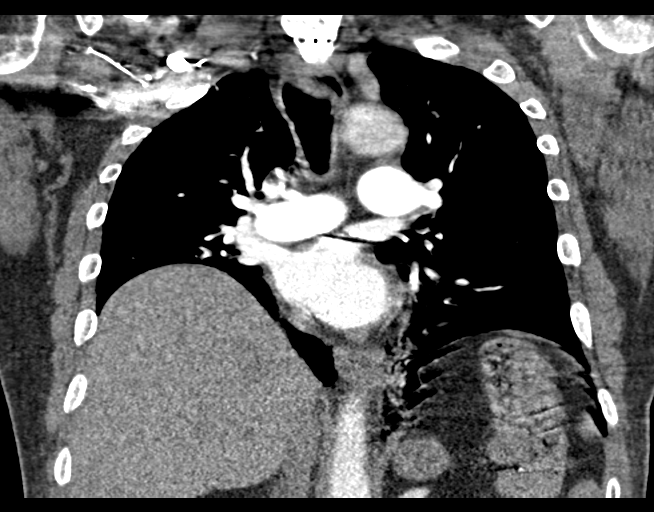
[im 113/151  soft-tissue]
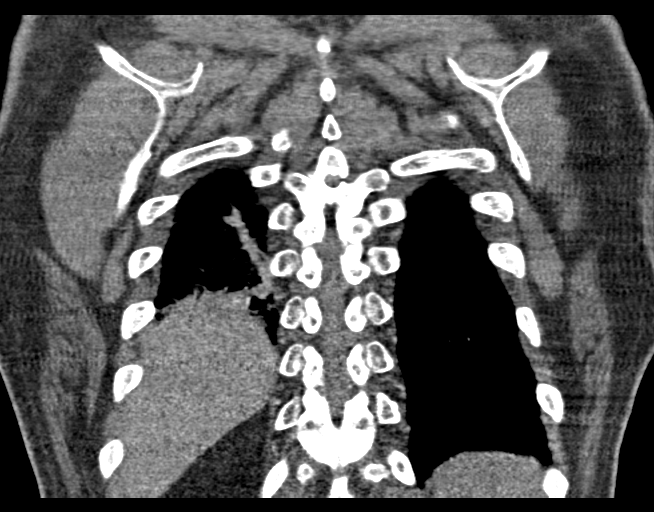

[18 of 46 positions shown; findings below may reference images not displayed]

FINDINGS: Cardiovascular: Left atrial appendage clip. The patient is status
post median sternotomy and CABG. Satisfactory opacification of the
pulmonary arteries to the segmental level without acute pulmonary
embolus. Nonaneurysmal minimally atherosclerotic aorta without
dissection. Heart size is within normal limits. No pericardial
effusion or thickening. Patent great vessels without aneurysm or
dissection.

Mediastinum/Nodes: No enlarged mediastinal, hilar, or axillary lymph
nodes. Thyroid gland, trachea, and esophagus demonstrate no
significant findings.

Lungs/Pleura: Superior segment right lower lobe consolidation and
atelectasis with air bronchograms suggested. No effusion or
pneumothorax. Small patchy foci of airspace disease in the medial
left lower lobe and anterior left upper lobe.

Upper Abdomen: No acute abnormality.

Musculoskeletal: ACDF of the included lower cervical spine. Thoracic
spondylosis.

Review of the MIP images confirms the above findings.
IMPRESSION: 1. No acute pulmonary embolus.
2. Status post median sternotomy and CABG.
3. Superior segment right lower lobe consolidation and atelectasis.
Small patchy foci of airspace disease in the left lower lobe and
anterior left upper lobe. Findings are suspicious for multilobar
pneumonia, greatest in the right lower lobe.

Aortic Atherosclerosis ([id number]-D97.7).

## 2019-11-15 IMAGING — CR DG CHEST 2V
2 series · 2 of 2 positions shown · non-contrast
Comparison: CT chest of 10/02/2017 and chest x-ray of the same day

CLINICAL DATA: Short of breath, cough for 5 days, history of CABG
in Saturday February, 2017

EXAM:
CHEST - 2 VIEW

[chest pa]
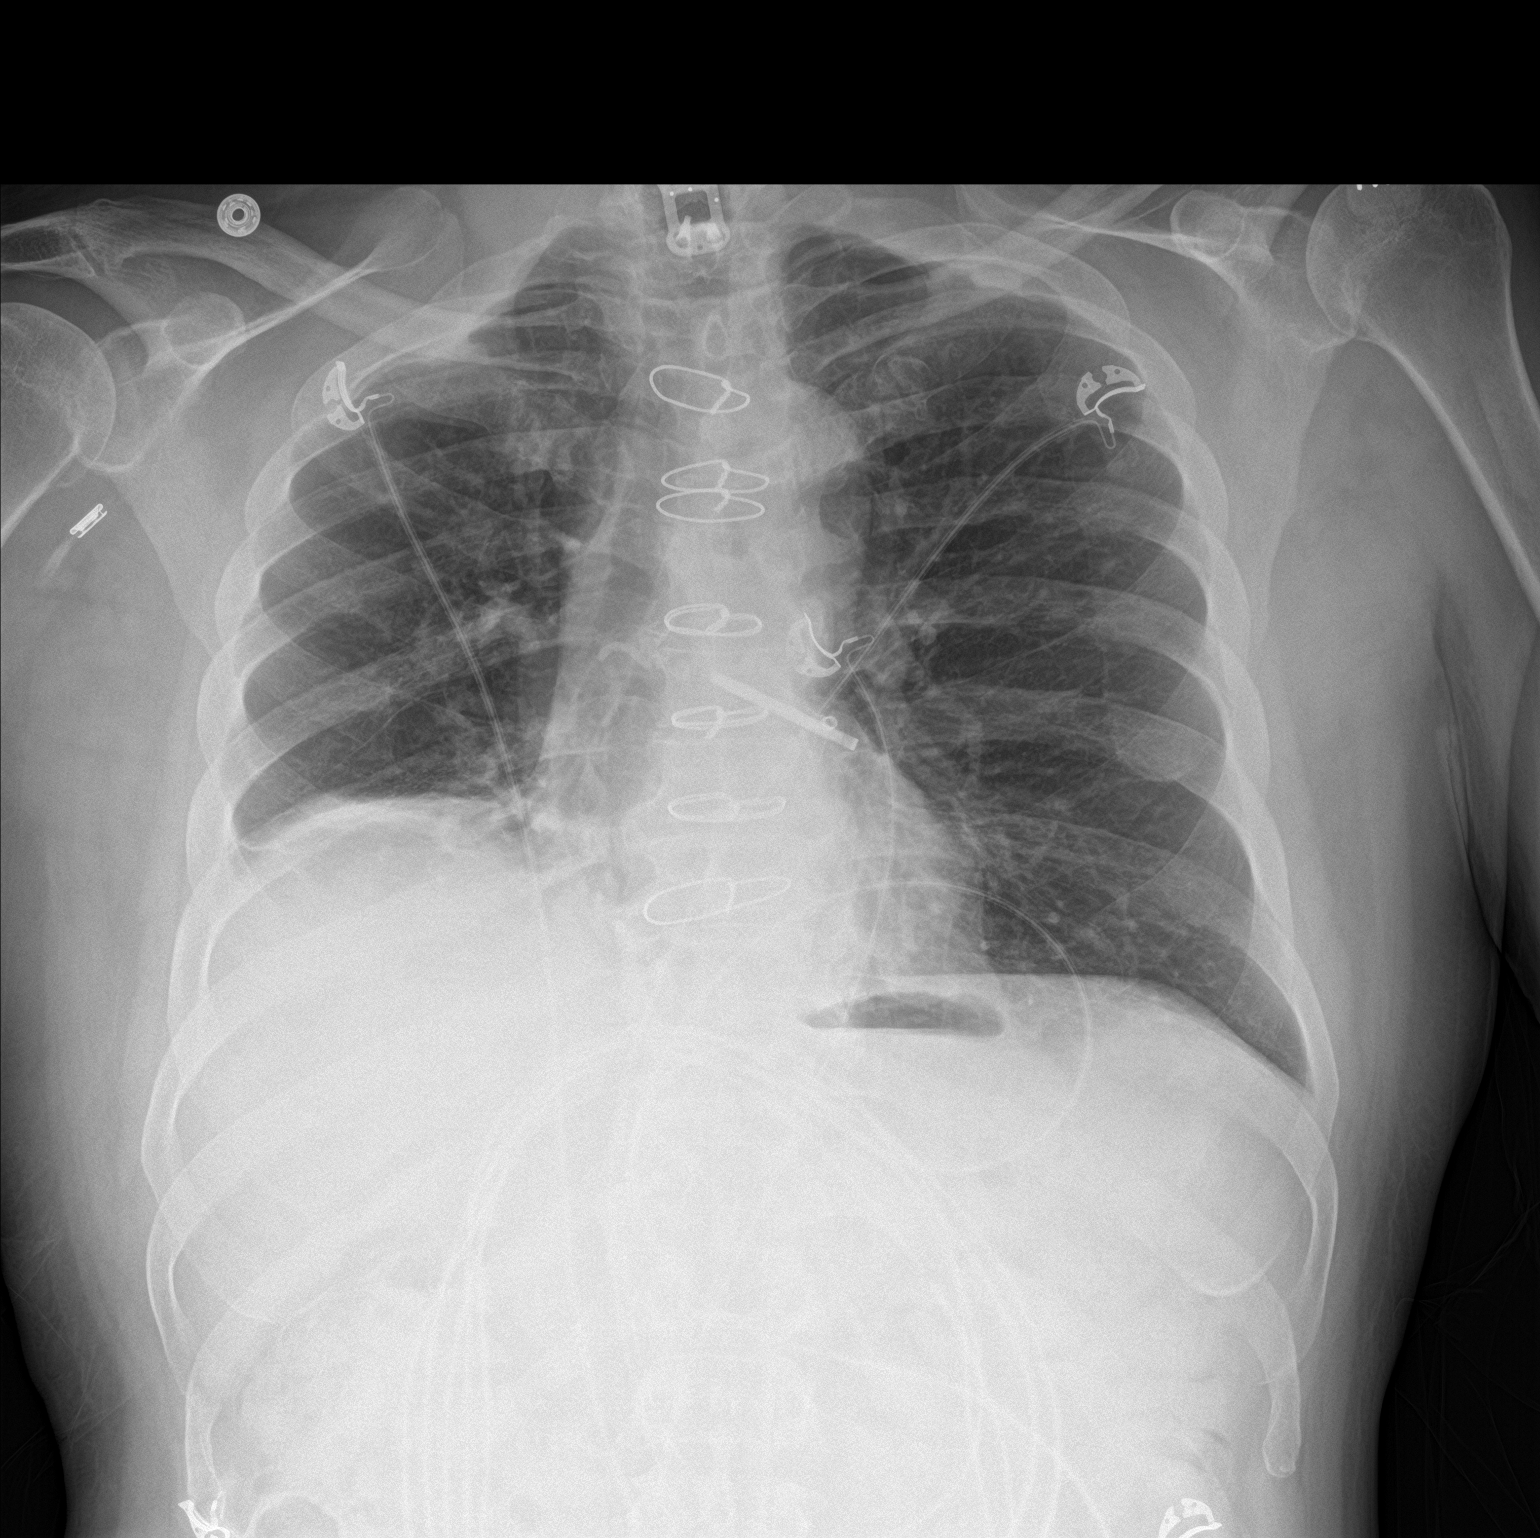

[chest lat]
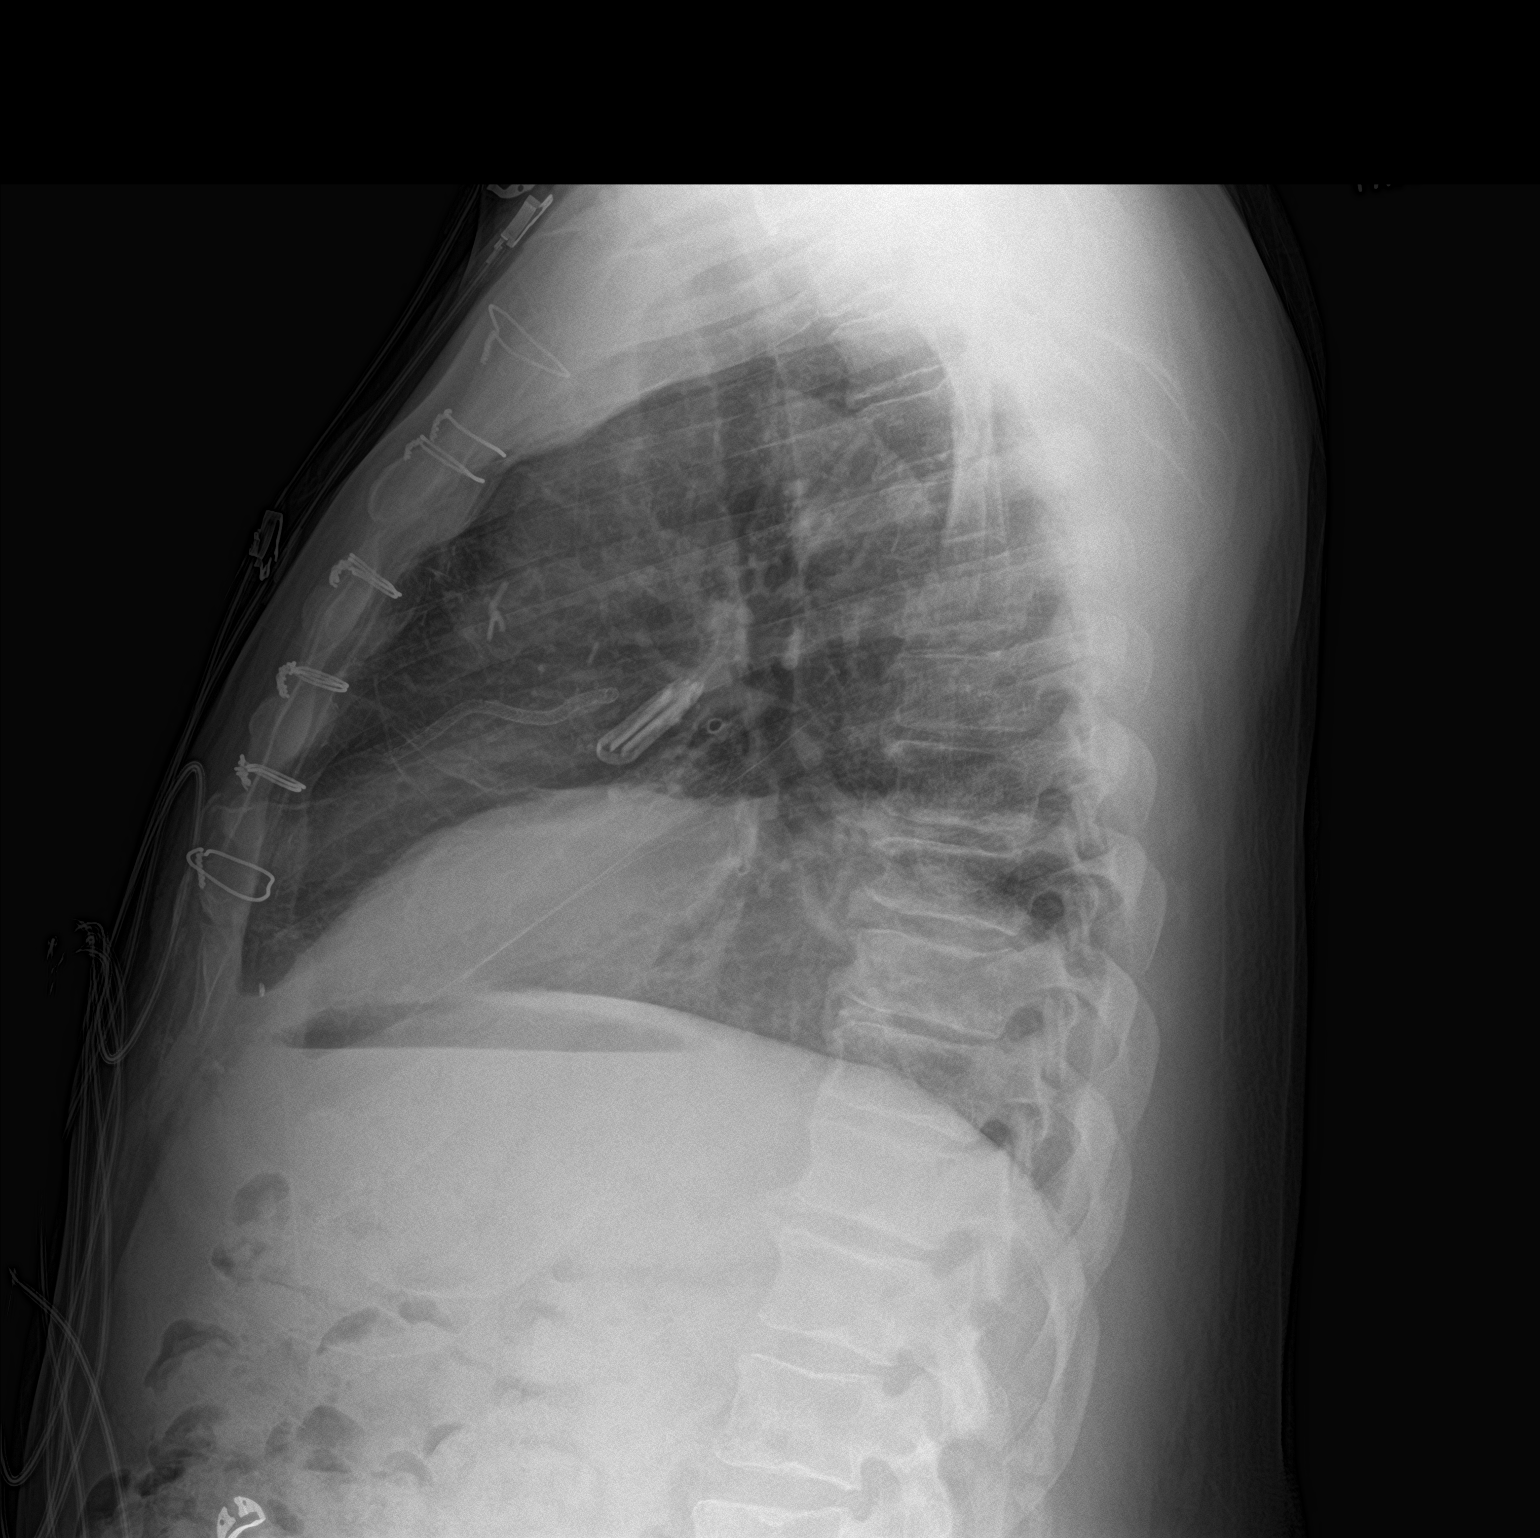

[2 of 2 positions shown; findings below may reference images not displayed]

FINDINGS: There is chronic elevation of the right hemidiaphragm with right
basilar atelectasis. The left lung is clear. Mediastinal and hilar
contours are unchanged and the heart is within normal limits in
size. A left atrial exclusion device is noted and median sternotomy
sutures are present from prior CABG. There are degenerative changes
in the mid to lower thoracic spine.
IMPRESSION: Stable chest x-ray with elevation of the right hemidiaphragm and
linear atelectasis at the right lung base. No active process.

## 2019-11-20 IMAGING — CR DG CHEST 2V
2 series · 2 of 2 positions shown · non-contrast
Comparison: 10/06/2018

CLINICAL DATA: Cough and chest pain. Hyperglycemia

EXAM:
CHEST - 2 VIEW

[chest pa]
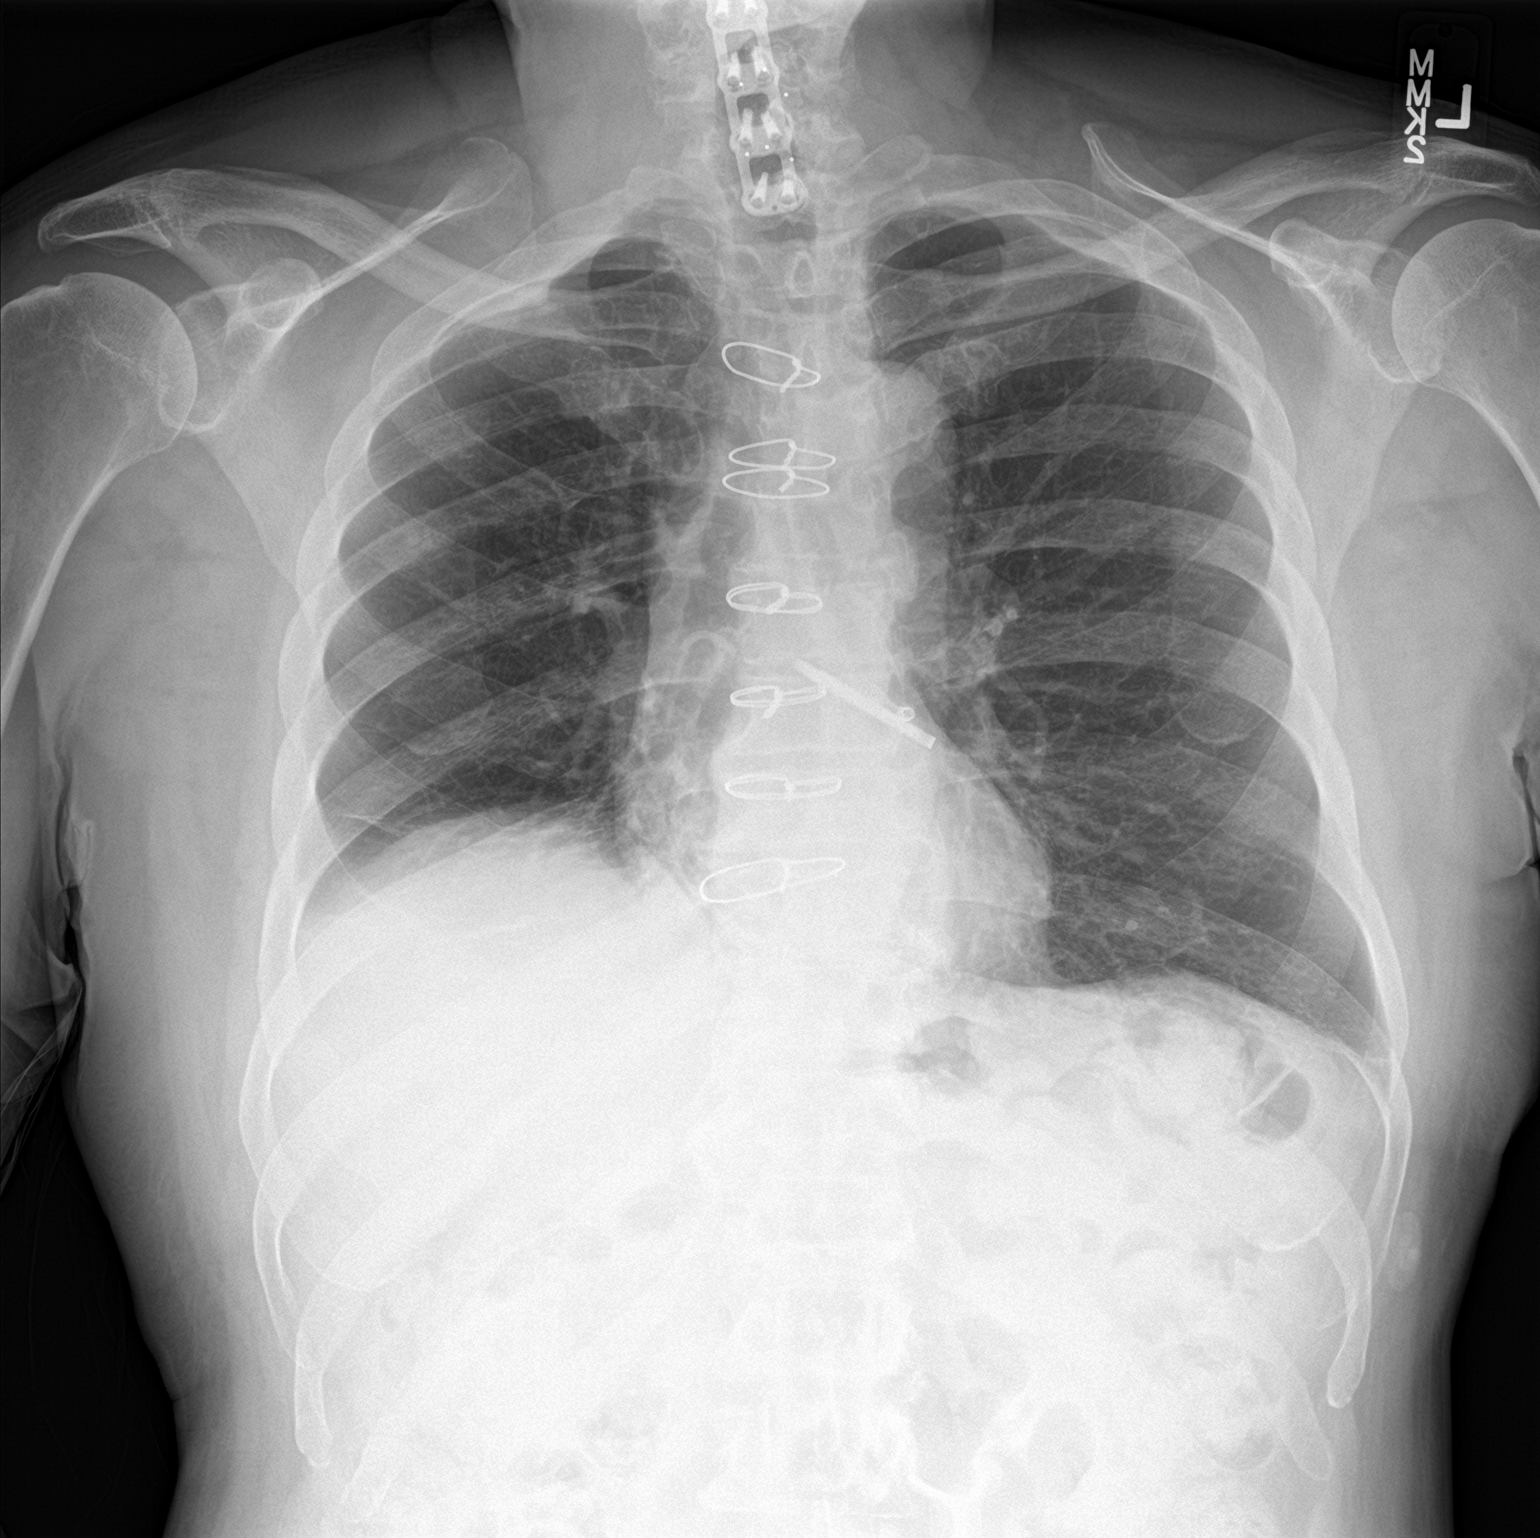

[chest lat]
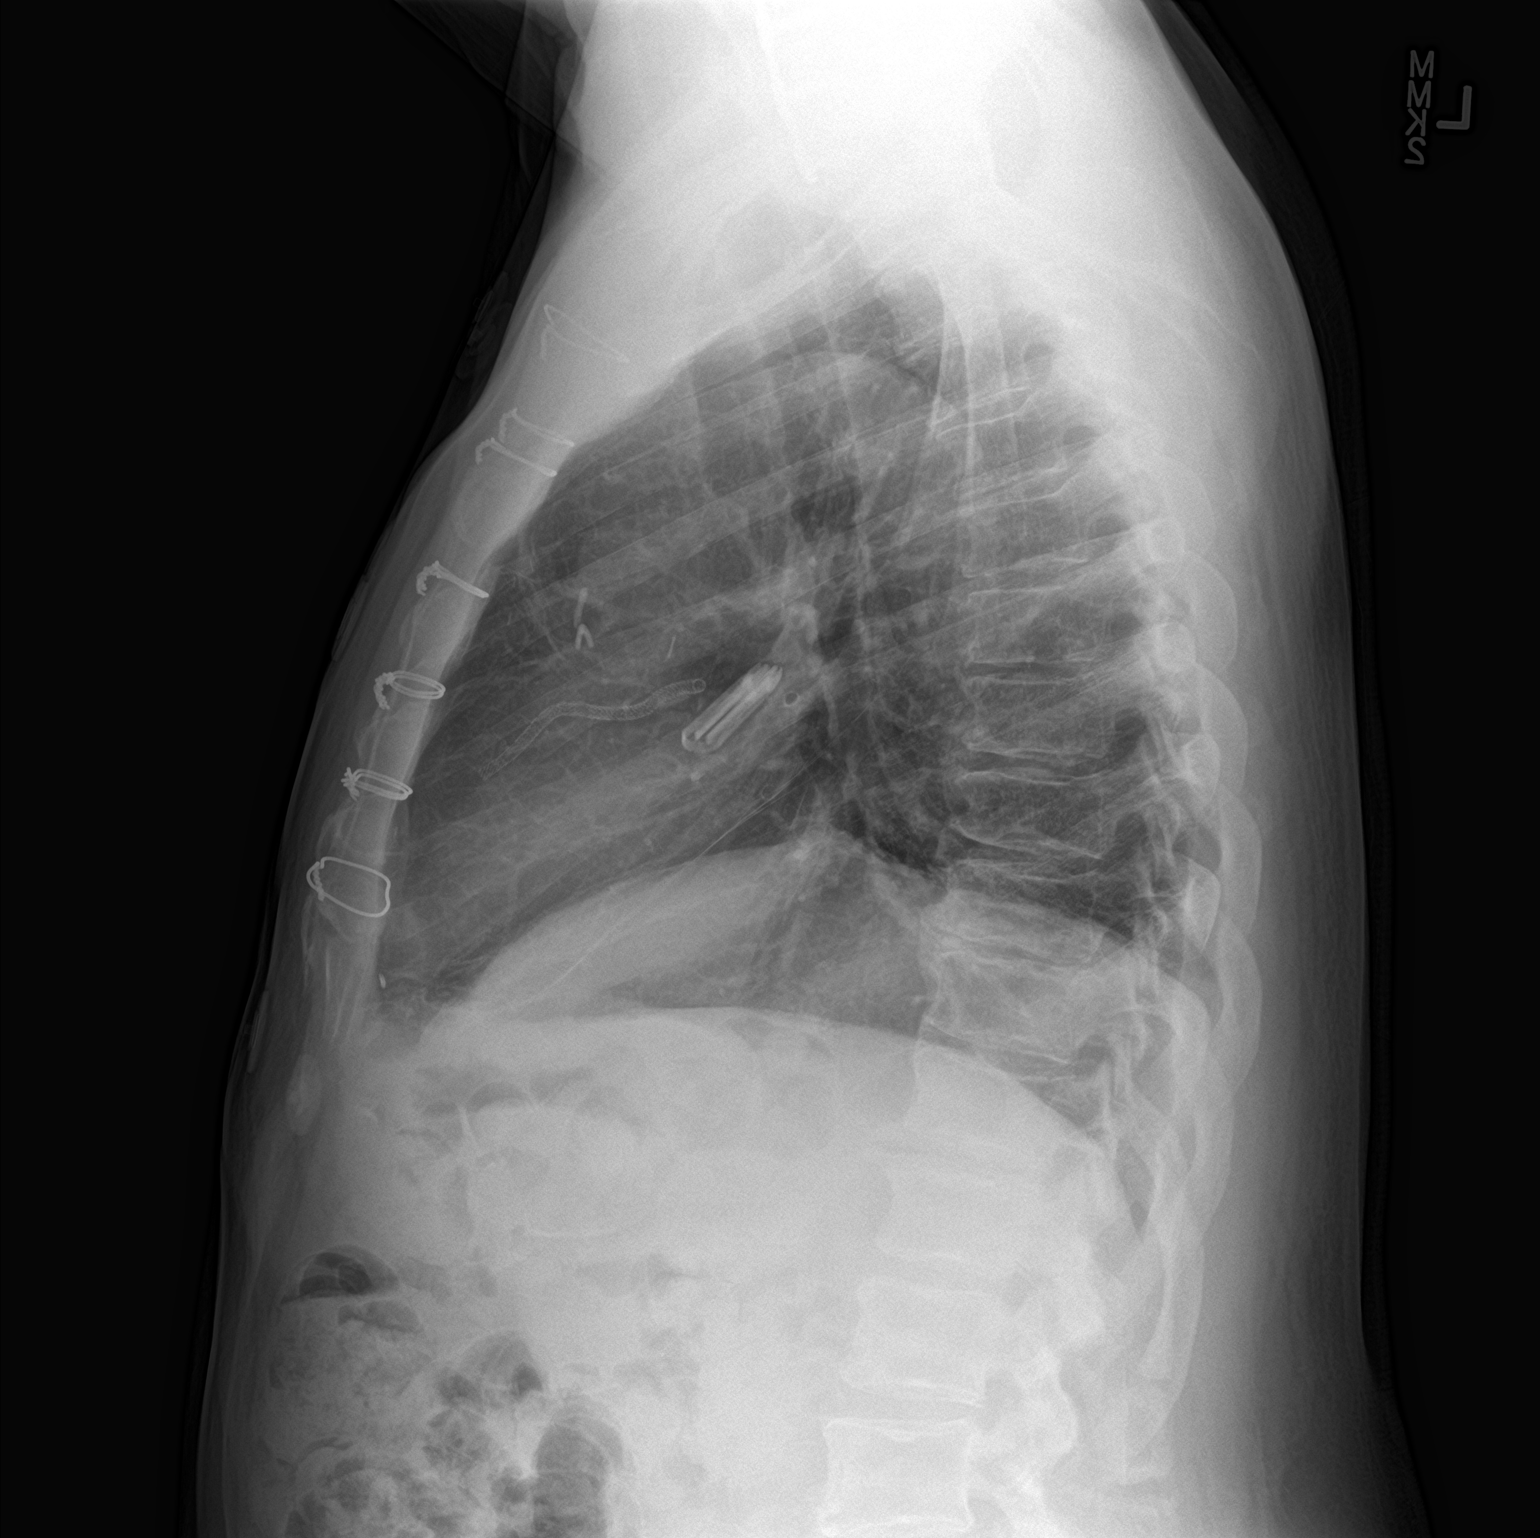

[2 of 2 positions shown; findings below may reference images not displayed]

FINDINGS: Coronary stent. Prior CABG. Heart size currently within normal
limits, without edema. Mildly elevated right hemidiaphragm with
bandlike density suggesting atelectasis or scarring along the right
hemidiaphragm. Left atrial appendage clip. Lower cervical plate and
screw fixator.
IMPRESSION: 1. Elevated right hemidiaphragm with mild atelectasis or scarring
along the right hemidiaphragm.
2. Prior CABG and coronary artery stenting.

## 2019-12-07 ENCOUNTER — Other Ambulatory Visit: Payer: Self-pay

## 2019-12-07 ENCOUNTER — Encounter (HOSPITAL_COMMUNITY): Payer: Self-pay

## 2019-12-07 ENCOUNTER — Emergency Department (HOSPITAL_COMMUNITY): Payer: Medicare Other

## 2019-12-07 ENCOUNTER — Inpatient Hospital Stay (HOSPITAL_COMMUNITY)
Admission: EM | Admit: 2019-12-07 | Discharge: 2019-12-09 | DRG: 603 | Disposition: A | Discharge status: Hospice | Payer: Medicare Other | Attending: Internal Medicine | Admitting: Internal Medicine

## 2019-12-07 DIAGNOSIS — Z91128 Patient's intentional underdosing of medication regimen for other reason: Secondary | ICD-10-CM

## 2019-12-07 DIAGNOSIS — K219 Gastro-esophageal reflux disease without esophagitis: Secondary | ICD-10-CM | POA: Diagnosis present

## 2019-12-07 DIAGNOSIS — I2510 Atherosclerotic heart disease of native coronary artery without angina pectoris: Secondary | ICD-10-CM | POA: Diagnosis present

## 2019-12-07 DIAGNOSIS — Z8249 Family history of ischemic heart disease and other diseases of the circulatory system: Secondary | ICD-10-CM

## 2019-12-07 DIAGNOSIS — D649 Anemia, unspecified: Secondary | ICD-10-CM | POA: Diagnosis not present

## 2019-12-07 DIAGNOSIS — Z86711 Personal history of pulmonary embolism: Secondary | ICD-10-CM

## 2019-12-07 DIAGNOSIS — E785 Hyperlipidemia, unspecified: Secondary | ICD-10-CM | POA: Diagnosis present

## 2019-12-07 DIAGNOSIS — F1420 Cocaine dependence, uncomplicated: Secondary | ICD-10-CM | POA: Diagnosis present

## 2019-12-07 DIAGNOSIS — E1165 Type 2 diabetes mellitus with hyperglycemia: Secondary | ICD-10-CM | POA: Diagnosis not present

## 2019-12-07 DIAGNOSIS — I480 Paroxysmal atrial fibrillation: Secondary | ICD-10-CM | POA: Diagnosis present

## 2019-12-07 DIAGNOSIS — N1832 Chronic kidney disease, stage 3b: Secondary | ICD-10-CM | POA: Diagnosis present

## 2019-12-07 DIAGNOSIS — R45851 Suicidal ideations: Secondary | ICD-10-CM

## 2019-12-07 DIAGNOSIS — D631 Anemia in chronic kidney disease: Secondary | ICD-10-CM | POA: Diagnosis present

## 2019-12-07 DIAGNOSIS — F332 Major depressive disorder, recurrent severe without psychotic features: Secondary | ICD-10-CM | POA: Diagnosis present

## 2019-12-07 DIAGNOSIS — T465X6A Underdosing of other antihypertensive drugs, initial encounter: Secondary | ICD-10-CM | POA: Diagnosis present

## 2019-12-07 DIAGNOSIS — I10 Essential (primary) hypertension: Secondary | ICD-10-CM | POA: Diagnosis present

## 2019-12-07 DIAGNOSIS — T383X6A Underdosing of insulin and oral hypoglycemic [antidiabetic] drugs, initial encounter: Secondary | ICD-10-CM | POA: Diagnosis present

## 2019-12-07 DIAGNOSIS — Z89421 Acquired absence of other right toe(s): Secondary | ICD-10-CM

## 2019-12-07 DIAGNOSIS — Z21 Asymptomatic human immunodeficiency virus [HIV] infection status: Secondary | ICD-10-CM | POA: Diagnosis present

## 2019-12-07 DIAGNOSIS — R0789 Other chest pain: Secondary | ICD-10-CM | POA: Diagnosis present

## 2019-12-07 DIAGNOSIS — R700 Elevated erythrocyte sedimentation rate: Secondary | ICD-10-CM | POA: Diagnosis present

## 2019-12-07 DIAGNOSIS — E1129 Type 2 diabetes mellitus with other diabetic kidney complication: Secondary | ICD-10-CM | POA: Diagnosis present

## 2019-12-07 DIAGNOSIS — E1140 Type 2 diabetes mellitus with diabetic neuropathy, unspecified: Secondary | ICD-10-CM | POA: Diagnosis present

## 2019-12-07 DIAGNOSIS — L03115 Cellulitis of right lower limb: Principal | ICD-10-CM | POA: Diagnosis present

## 2019-12-07 DIAGNOSIS — IMO0002 Reserved for concepts with insufficient information to code with codable children: Secondary | ICD-10-CM | POA: Diagnosis present

## 2019-12-07 DIAGNOSIS — E1151 Type 2 diabetes mellitus with diabetic peripheral angiopathy without gangrene: Secondary | ICD-10-CM | POA: Diagnosis present

## 2019-12-07 DIAGNOSIS — Z794 Long term (current) use of insulin: Secondary | ICD-10-CM

## 2019-12-07 DIAGNOSIS — Z951 Presence of aortocoronary bypass graft: Secondary | ICD-10-CM

## 2019-12-07 DIAGNOSIS — Z20822 Contact with and (suspected) exposure to covid-19: Secondary | ICD-10-CM | POA: Diagnosis present

## 2019-12-07 DIAGNOSIS — E1122 Type 2 diabetes mellitus with diabetic chronic kidney disease: Secondary | ICD-10-CM | POA: Diagnosis present

## 2019-12-07 DIAGNOSIS — Z833 Family history of diabetes mellitus: Secondary | ICD-10-CM

## 2019-12-07 DIAGNOSIS — J45909 Unspecified asthma, uncomplicated: Secondary | ICD-10-CM | POA: Diagnosis present

## 2019-12-07 DIAGNOSIS — L0390 Cellulitis, unspecified: Secondary | ICD-10-CM | POA: Diagnosis present

## 2019-12-07 DIAGNOSIS — I129 Hypertensive chronic kidney disease with stage 1 through stage 4 chronic kidney disease, or unspecified chronic kidney disease: Secondary | ICD-10-CM | POA: Diagnosis present

## 2019-12-07 DIAGNOSIS — Z8349 Family history of other endocrine, nutritional and metabolic diseases: Secondary | ICD-10-CM

## 2019-12-07 DIAGNOSIS — E1121 Type 2 diabetes mellitus with diabetic nephropathy: Secondary | ICD-10-CM | POA: Diagnosis not present

## 2019-12-07 LAB — BASIC METABOLIC PANEL
Anion gap: 9 (ref 5–15)
BUN: 29 mg/dL — ABNORMAL HIGH (ref 8–23)
CO2: 24 mmol/L (ref 22–32)
Calcium: 9.4 mg/dL (ref 8.9–10.3)
Chloride: 102 mmol/L (ref 98–111)
Creatinine, Ser: 2.05 mg/dL — ABNORMAL HIGH (ref 0.61–1.24)
GFR calc Af Amer: 37 mL/min — ABNORMAL LOW (ref >60)
GFR calc non Af Amer: 32 mL/min — ABNORMAL LOW (ref >60)
Glucose, Bld: 470 mg/dL — ABNORMAL HIGH (ref 70–99)
Potassium: 4 mmol/L (ref 3.5–5.1)
Sodium: 135 mmol/L (ref 135–145)

## 2019-12-07 LAB — SEDIMENTATION RATE: Sed Rate: 48 mm/hr — ABNORMAL HIGH (ref 0–16)

## 2019-12-07 LAB — CBC
HCT: 35.8 % — ABNORMAL LOW (ref 39.0–52.0)
Hemoglobin: 11.4 g/dL — ABNORMAL LOW (ref 13.0–17.0)
MCH: 25.2 pg — ABNORMAL LOW (ref 26.0–34.0)
MCHC: 31.8 g/dL (ref 30.0–36.0)
MCV: 79 fL — ABNORMAL LOW (ref 80.0–100.0)
Platelets: 173 10*3/uL (ref 150–400)
RBC: 4.53 MIL/uL (ref 4.22–5.81)
RDW: 13.8 % (ref 11.5–15.5)
WBC: 4.6 10*3/uL (ref 4.0–10.5)
nRBC: 0 % (ref 0.0–0.2)

## 2019-12-07 LAB — SARS CORONAVIRUS 2 (TAT 6-24 HRS): SARS Coronavirus 2: NEGATIVE

## 2019-12-07 LAB — TROPONIN I (HIGH SENSITIVITY): Troponin I (High Sensitivity): 7 ng/L (ref <18)

## 2019-12-07 LAB — D-DIMER, QUANTITATIVE: D-Dimer, Quant: <0.27 ug/mL-FEU (ref 0.00–0.50)

## 2019-12-07 LAB — CBG MONITORING, ED
Glucose-Capillary: 331 mg/dL — ABNORMAL HIGH (ref 70–99)
Glucose-Capillary: 347 mg/dL — ABNORMAL HIGH (ref 70–99)

## 2019-12-07 LAB — HEMOGLOBIN A1C
Hgb A1c MFr Bld: 13 % — ABNORMAL HIGH (ref 4.8–5.6)
Mean Plasma Glucose: 326.4 mg/dL

## 2019-12-07 MED ORDER — OXYCODONE HCL 5 MG PO TABS
5.0000 mg | ORAL_TABLET | ORAL | Status: DC | PRN
  Administered 2019-12-07 – 2019-12-09 (×7): 5 mg via ORAL
  Filled 2019-12-07 (×7): qty 1

## 2019-12-07 MED ORDER — ALBUTEROL SULFATE (2.5 MG/3ML) 0.083% IN NEBU: 3.0000 mL | INHALATION_SOLUTION | Freq: Four times a day (QID) | RESPIRATORY_TRACT | Status: DC | PRN

## 2019-12-07 MED ORDER — DILTIAZEM HCL ER COATED BEADS 120 MG PO CP24
120.0000 mg | ORAL_CAPSULE | Freq: Once | ORAL | Status: AC
  Administered 2019-12-07: 13:00:00 120 mg via ORAL
  Filled 2019-12-07 (×2): qty 1

## 2019-12-07 MED ORDER — INSULIN GLARGINE 100 UNIT/ML ~~LOC~~ SOLN
30.0000 [IU] | Freq: Every day | SUBCUTANEOUS | Status: DC
  Administered 2019-12-07 – 2019-12-09 (×3): 30 [IU] via SUBCUTANEOUS
  Filled 2019-12-07 (×3): qty 0.3

## 2019-12-07 MED ORDER — CEFAZOLIN SODIUM-DEXTROSE 1-4 GM/50ML-% IV SOLN
1.0000 g | Freq: Three times a day (TID) | INTRAVENOUS | Status: DC
  Administered 2019-12-07 – 2019-12-09 (×6): 1 g via INTRAVENOUS
  Filled 2019-12-07 (×8): qty 50

## 2019-12-07 MED ORDER — GABAPENTIN 400 MG PO CAPS: 400.0000 mg | ORAL_CAPSULE | Freq: Two times a day (BID) | ORAL | Status: DC | PRN

## 2019-12-07 MED ORDER — SERTRALINE HCL 50 MG PO TABS
50.0000 mg | ORAL_TABLET | Freq: Every day | ORAL | Status: DC
  Administered 2019-12-07 – 2019-12-09 (×3): 50 mg via ORAL
  Filled 2019-12-07 (×3): qty 1

## 2019-12-07 MED ORDER — ALLOPURINOL 100 MG PO TABS
100.0000 mg | ORAL_TABLET | Freq: Every day | ORAL | Status: DC | PRN
  Filled 2019-12-07: qty 1

## 2019-12-07 MED ORDER — ACETAMINOPHEN 650 MG RE SUPP: 650.0000 mg | Freq: Four times a day (QID) | RECTAL | Status: DC | PRN

## 2019-12-07 MED ORDER — ACETAMINOPHEN 325 MG PO TABS: 650.0000 mg | ORAL_TABLET | Freq: Four times a day (QID) | ORAL | Status: DC | PRN

## 2019-12-07 MED ORDER — HEPARIN SODIUM (PORCINE) 5000 UNIT/ML IJ SOLN
5000.0000 [IU] | Freq: Three times a day (TID) | INTRAMUSCULAR | Status: DC
  Administered 2019-12-07 – 2019-12-09 (×6): 5000 [IU] via SUBCUTANEOUS
  Filled 2019-12-07 (×6): qty 1

## 2019-12-07 MED ORDER — DILTIAZEM HCL ER COATED BEADS 120 MG PO CP24
120.0000 mg | ORAL_CAPSULE | Freq: Every day | ORAL | Status: DC
  Administered 2019-12-08 – 2019-12-09 (×2): 120 mg via ORAL
  Filled 2019-12-07 (×4): qty 1

## 2019-12-07 MED ORDER — ACETAMINOPHEN 325 MG PO TABS
650.0000 mg | ORAL_TABLET | Freq: Once | ORAL | Status: AC
  Administered 2019-12-07: 650 mg via ORAL
  Filled 2019-12-07: qty 2

## 2019-12-07 MED ORDER — FAMOTIDINE 20 MG PO TABS: 20.0000 mg | ORAL_TABLET | Freq: Two times a day (BID) | ORAL | Status: DC | PRN

## 2019-12-07 MED ORDER — ONDANSETRON HCL 4 MG PO TABS: 4.0000 mg | ORAL_TABLET | Freq: Three times a day (TID) | ORAL | Status: DC | PRN

## 2019-12-07 MED ORDER — INSULIN ASPART 100 UNIT/ML ~~LOC~~ SOLN
0.0000 [IU] | Freq: Three times a day (TID) | SUBCUTANEOUS | Status: DC
  Administered 2019-12-07: 16:00:00 11 [IU] via SUBCUTANEOUS
  Administered 2019-12-08: 8 [IU] via SUBCUTANEOUS
  Administered 2019-12-08: 06:00:00 11 [IU] via SUBCUTANEOUS
  Administered 2019-12-09: 5 [IU] via SUBCUTANEOUS
  Administered 2019-12-09: 2 [IU] via SUBCUTANEOUS

## 2019-12-07 MED ORDER — TRAZODONE HCL 100 MG PO TABS
100.0000 mg | ORAL_TABLET | Freq: Every evening | ORAL | Status: DC | PRN
  Administered 2019-12-07 – 2019-12-08 (×2): 100 mg via ORAL
  Filled 2019-12-07 (×2): qty 1

## 2019-12-07 NOTE — ED Provider Notes (Signed)
[facility] EMERGENCY DEPARTMENT Provider Note   CSN: [id number] Arrival date & time: 12/07/19  3810     History Chief Complaint  Patient presents with  . Chest Pain  . Wound Check    Michael [name] is a 68 y.o. male with PMH significant for CAD s/p CABG, IDDM, HTN, HLD, PE, HIV, polysubstance abuse, and depression with psychotic features who presents to the ED with multiple complaints.  He continues to endorse central, sharp chest discomfort that he has been experiencing ever since his CABG procedure performed 01/29/2018.  He does however state that in the past month he is endorsing worsening cough and pleuritic chest discomfort.  Patient does admit that these feel similar to his recent ED encounters.  Patient's most significant discomfort is involving his right foot.  He is endorsing 2-week history of progressively worsening swelling and pain as well as difficulty with ambulation and weightbearing.  I reviewed patient's medical record and he had amputation of his right second toe performed by Dr. [name] 07/21/2019.  His sutures appear to be intact.  He admits that he was supposed to return to orthopedic office for suture removal, however reports transportation difficulties.  Endorses subjective fevers.  He states that he does not see his primary care provider as regularly as he should due to these same transportation limitations.  He admits to poor medication compliance and has not been taking his blood pressure medications.  He denies any headache or dizziness, recent syncopal episodes, abdominal pain, nausea or vomiting, diminished appetite, urinary symptoms, or changes in bowel habits.  He also is admitting to passive suicidal ideation.  He reports that he has had thoughts about hurting himself recently.  He is also complaining of feelings of "things crawling underneath my skin" which has led to mild excoriation over the course of the past few weeks.  He denies any HI, alcohol  use, or other illicit drug use.  He does endorse seeing bugs on the walls that may or may not be there, presumably the ones that he believes are crawling under his skin.  HPI     Past Medical History:  Diagnosis Date  . A-fib ([place])   . Asthma    No PFTs, history of childhood asthma  . CAD (coronary artery disease)    a. 06/2013 STEMI/PCI ([facility]): LAD w/ thrombus (treated with BMS), mid 75%, D2 75%; LCX OM2 75%; RCA small, PDA 95%, PLV 95%;  b. 10/2013 Cath/PCI: ISR w/in LAD (Promus DES x 2), borderline OM2 lesion;  c. 01/2014 MV: Intermediate risk, medium-sized distal ant wall infarct w/ very small amt of peri-infarct ischemia. EF 60%.  . Cellulitis 04/2014   left facial  . Chondromalacia of medial femoral condyle    Left knee MRI 04/28/12: Chondromalacia of the medial femoral condyle with slight peripheral degeneration of the meniscocapsular junction of the medial meniscus; followed by sports medicine  . Collagen vascular disease ([place])   . Crack cocaine use    for 20+ years, has been enrolled in detox programs in the past  . Depression    with history of hospitalization for suicidal ideation  . Diabetes mellitus 2002   Diagnosed in 2002, started insulin in 2012  . Gout   . Gout 04/28/2012  . Headache(784.0)    CT head 08/2011: Periventricular and subcortical white matter hypodensities are most in keeping with chronic microangiopathic change  . HIV infection [facility]) Nov 2012   Followed by [name]  . Hyperlipidemia   .  Hypertension   . Pulmonary embolism [facility])     Patient Active Problem List   Diagnosis Date Noted  . Cellulitis 12/07/2019  . Osteomyelitis ([place]) 07/17/2019  . Abscess or cellulitis of toe, right   . Toe pain, right-second   . MDD (major depressive disorder), recurrent episode, severe ([place]) 06/15/2019  . Respiratory distress 04/12/2019  . Pneumonia due to severe acute respiratory syndrome coronavirus 2 (SARS-CoV-2) 04/11/2019  . COVID-19 virus infection 03/19/2019   . Chest pain 10/03/2018  . Malnutrition of moderate degree 09/21/2018  . MDD (major depressive disorder), recurrent episode, moderate ([place])   . Type II diabetes mellitus with renal manifestations ([place]) 05/04/2018  . GERD (gastroesophageal reflux disease) 05/04/2018  . S/P CABG (coronary artery bypass graft)   . Left testicular pain   . Depression 02/20/2018  . Epididymo-orchitis, acute 02/20/2018  . Essential hypertension 02/20/2018  . Precordial chest pain 02/20/2018  . AKI (acute kidney injury) ([place]) 02/14/2018  . HCAP (healthcare-associated pneumonia) 02/14/2018  . History of pulmonary embolism 07/04/2017  . Acute hyponatremia 05/11/2017  . Hyperglycemia due to type 2 diabetes mellitus ([place]) 05/11/2017  . CHF (congestive heart failure) ([place]) 04/01/2017  . Hepatitis C 12/30/2016  . Chronic ischemic heart disease 11/12/2016  . Paroxysmal A-fib ([place]) 11/12/2016  . Back pain 04/18/2016  . S/P carotid endarterectomy 11/15/2015  . MDD (major depressive disorder), recurrent severe, without psychosis ([place]) 09/09/2015  . Cocaine-induced mood disorder ([place]) 08/14/2015  . Cocaine abuse with cocaine-induced mood disorder ([place]) 08/14/2015  . Gout 07/10/2015  . Acute renal failure superimposed on stage 3 chronic kidney disease ([place]) 03/06/2015  . Normocytic anemia 03/06/2015  . Chronic kidney disease, stage III (moderate) 03/06/2015  . Hypoglycemia   . Encounter for general adult medical examination with abnormal findings 02/09/2015  . Cocaine use disorder, severe, dependence ([place]) 12/13/2014  . Substance or medication-induced depressive disorder with onset during withdrawal ([name]) 12/13/2014  . Severe recurrent major depressive disorder with psychotic features ([place]) 12/12/2014  . Cervicalgia 06/28/2014  . Lumbar radiculopathy, chronic 06/28/2014  . Asthma, chronic 02/03/2014  . 3-vessel CAD 06/24/2013  . ED (erectile dysfunction) of organic origin 07/07/2012  . Hypertension goal BP  (blood pressure) < 140/80 04/29/2012  . Chondromalacia of left knee 03/19/2012  . Hyperlipidemia with target LDL less than 100 02/12/2012  . Fibromyalgia 02/12/2012  . HIV (human immunodeficiency virus infection) ([place]) 08/27/2011  . Uncontrolled type 2 diabetes with neuropathy ([place]) 10/17/2000    Past Surgical History:  Procedure Laterality Date  . AMPUTATION Right 07/21/2019   Procedure: RIGHT SECOND TOE AMPUTATION;  Surgeon: [name], MD;  Location: [place];  Service: Orthopedics;  Laterality: Right;  . BACK SURGERY     1988  . BOWEL RESECTION    . CARDIAC SURGERY    . CERVICAL SPINE SURGERY     " rods in my neck "  . CORONARY ARTERY BYPASS GRAFT    . CORONARY STENT PLACEMENT    . NM MYOCAR PERF WALL MOTION  12/27/2011   normal  . SPINE SURGERY         Family History  Problem Relation Age of Onset  . Diabetes Mother   . Hypertension Mother   . Hyperlipidemia Mother   . Diabetes Father   . Cancer Father   . Hypertension Father   . Diabetes Brother   . Heart disease Brother   . Diabetes Sister   . Colon cancer Neg Hx     Social History  Tobacco Use  . Smoking status: Never Smoker  . Smokeless tobacco: Never Used  Substance Use Topics  . Alcohol use: No    Alcohol/week: 4.0 standard drinks    Types: 2 Cans of beer, 2 Shots of liquor per week  . Drug use: Yes    Frequency: 4.0 times per week    Types: "Crack" cocaine, Cocaine    Comment: Recent use of crack    Home Medications Prior to Admission medications   Medication Sig Start Date End Date Taking? Authorizing Provider  albuterol (VENTOLIN HFA) 108 (90 Base) MCG/ACT inhaler Inhale 1-2 puffs into the lungs every 6 (six) hours as needed for wheezing or shortness of breath. 06/19/19  Yes Mallie Darting, NP  diltiazem (CARDIZEM CD) 120 MG 24 hr capsule Take 1 capsule (120 mg total) by mouth daily. For blood pressure 07/30/19 12/07/19 Yes Elodia Florence., MD  famotidine (PEPCID) 20 MG tablet Take 1  tablet (20 mg total) by mouth 2 (two) times daily. For heart burn Patient taking differently: Take 20 mg by mouth 2 (two) times daily as needed for heartburn.  07/30/19 12/07/19 Yes Elodia Florence., MD  gabapentin (NEURONTIN) 400 MG capsule Take 1 capsule (400 mg total) by mouth 2 (two) times daily. For agitation/neuropathy Patient taking differently: Take 400 mg by mouth 2 (two) times daily as needed (agitation/neuropathy).  07/30/19 12/07/19 Yes Elodia Florence., MD  HUMALOG 100 UNIT/ML injection Inject 4-6 Units into the skin daily. 07/08/19  Yes [provider]  Insulin Glargine (LANTUS) 100 UNIT/ML Solostar Pen Inject 20 Units into the skin daily. Patient taking differently: Inject 30 Units into the skin daily.  07/30/19 12/07/19 Yes Elodia Florence., MD  metFORMIN (GLUCOPHAGE) 1000 MG tablet Take 1 tablet (1,000 mg total) by mouth daily. Patient taking differently: Take 1,000 mg by mouth 2 (two) times daily.  07/30/19 12/07/19 Yes Elodia Florence., MD  ondansetron (ZOFRAN) 4 MG tablet Take 1 tablet (4 mg total) by mouth every 8 (eight) hours as needed for nausea or vomiting. 08/21/19  Yes Cardama, Grayce Sessions, MD  sertraline (ZOLOFT) 50 MG tablet Take 1 tablet (50 mg total) by mouth daily. For depression 07/30/19 12/07/19 Yes Elodia Florence., MD  traZODone (DESYREL) 100 MG tablet Take 1 tablet (100 mg total) by mouth at bedtime as needed for sleep. 07/30/19 12/07/19 Yes Elodia Florence., MD  allopurinol (ZYLOPRIM) 100 MG tablet Take 100 mg by mouth daily as needed (gout).  07/30/19   [provider]  nitroGLYCERIN (NITROSTAT) 0.4 MG SL tablet Place 1 tablet (0.4 mg total) under the tongue every 5 (five) minutes as needed for chest pain. 06/19/19   Mallie Darting, NP    Allergies    Patient has no known allergies.  Review of Systems   Review of Systems  All other systems reviewed and are negative.   Physical Exam Updated Vital Signs BP (!)  147/92   Pulse 84   Temp 97.8 F (36.6 C) (Oral)   Resp 20   Ht 5\' 8"  (1.727 m)   Wt 74.4 kg   SpO2 100%   BMI 24.94 kg/m   Physical Exam Vitals and nursing note reviewed. Exam conducted with a chaperone present.  Constitutional:      Appearance: Normal appearance. He is not ill-appearing.  HENT:     Head: Normocephalic and atraumatic.  Eyes:     General: No scleral icterus.  Conjunctiva/sclera: Conjunctivae normal.  Cardiovascular:     Rate and Rhythm: Normal rate and regular rhythm.     Pulses: Normal pulses.     Heart sounds: Normal heart sounds.  Pulmonary:     Effort: Pulmonary effort is normal. No respiratory distress.     Breath sounds: Normal breath sounds.  Abdominal:     General: Abdomen is flat. There is no distension.     Palpations: Abdomen is soft.     Tenderness: There is no abdominal tenderness. There is no guarding.  Musculoskeletal:     Cervical back: Normal range of motion and neck supple. No rigidity.     Right lower leg: No edema.     Left lower leg: No edema.     Comments: Right foot: Sutures appear intact in the area of second toe amputation.  Entire foot appears to be swollen and erythematous.  No obvious bleeding or purulent drainage.  Sensation intact throughout.  Pedal pulse intact.  Can wiggle toes and flex and dorsiflex foot.  3 sutures visualized, but difficult due to crusting.  Skin:    General: Skin is dry.     Capillary Refill: Capillary refill takes less than 2 seconds.  Neurological:     Mental Status: He is alert.     GCS: GCS eye subscore is 4. GCS verbal subscore is 5. GCS motor subscore is 6.  Psychiatric:        Mood and Affect: Mood normal.        Behavior: Behavior normal.        Thought Content: Thought content normal.       ED Results / Procedures / Treatments   Labs (all labs ordered are listed, but only abnormal results are displayed) Labs Reviewed  BASIC METABOLIC PANEL - Abnormal; Notable for the following  components:      Result Value   Glucose, Bld 470 (*)    BUN 29 (*)    Creatinine, Ser 2.05 (*)    GFR calc non Af Amer 32 (*)    GFR calc Af Amer 37 (*)    All other components within normal limits  CBC - Abnormal; Notable for the following components:   Hemoglobin 11.4 (*)    HCT 35.8 (*)    MCV 79.0 (*)    MCH 25.2 (*)    All other components within normal limits  SEDIMENTATION RATE - Abnormal; Notable for the following components:   Sed Rate 48 (*)    All other components within normal limits  CBG MONITORING, ED - Abnormal; Notable for the following components:   Glucose-Capillary 331 (*)    All other components within normal limits  D-DIMER, QUANTITATIVE (NOT AT [facility])  HEMOGLOBIN A1C  TROPONIN I (HIGH SENSITIVITY)    EKG EKG Interpretation  Date/Time:  Tuesday December 07 2019 09:30:30 EST Ventricular Rate:  88 PR Interval:  138 QRS Duration: 84 QT Interval:  338 QTC Calculation: 408 R Axis:   78 Text Interpretation: Sinus rhythm with marked sinus arrhythmia Otherwise normal ECG Confirmed by Virgel Manifold [phone]) on 12/07/2019 10:55:48 AM   Radiology DG Chest 2 View  Result Date: 12/07/2019 CLINICAL DATA:  Chest pain EXAM: CHEST - 2 VIEW COMPARISON:  08/11/2019 FINDINGS: The heart size and mediastinal contours are within normal limits. Both lungs are clear. No pleural effusion or pneumothorax. No acute osseous abnormality. IMPRESSION: No acute process in the chest. Electronically Signed   By: Macy Mis M.D.   On: 12/07/2019  10:00   DG Foot Complete Right  Result Date: 12/07/2019 CLINICAL DATA:  Pain swelling and discharge at surgical site. History of second digit amputation. EXAM: RIGHT FOOT COMPLETE - 3+ VIEW COMPARISON:  07/16/2019, 07/17/2019 FINDINGS: Postsurgical changes from prior second digit amputation at the level of the second MTP joint. There is prominence and irregularity of the soft tissues at the resection site. No definite soft tissue gas. Osseous  structures appear intact without acute fracture, cortical destruction, or periostitis. Chronic healed fractures of the fourth and fifth metatarsal necks. IMPRESSION: Prominence of the soft tissues at the resection site of the second digit amputation. No definite soft tissue gas. No radiographic evidence of osteomyelitis. Electronically Signed   By: Davina Poke D.O.   On: 12/07/2019 10:31    Procedures .Suture Removal  Date/Time: 12/07/2019 12:22 PM Performed by: Corena Herter, PA-C Authorized by: Corena Herter, PA-C   Consent:    Consent obtained:  Verbal   Consent given by:  Patient   Risks discussed:  Bleeding, pain and wound separation   Alternatives discussed:  No treatment Location:    Location:  Lower extremity   Lower extremity location:  Foot   Foot location:  R foot Procedure details:    Wound appearance:  Warm, red, tender and draining   Drainage characteristics:  Purulent   Number of sutures removed:  4 Post-procedure details:    Patient tolerance of procedure:  Tolerated well, no immediate complications   (including critical care time)  Medications Ordered in ED Medications  allopurinol (ZYLOPRIM) tablet 100 mg (has no administration in time range)  diltiazem (CARDIZEM CD) 24 hr capsule 120 mg (has no administration in time range)  sertraline (ZOLOFT) tablet 50 mg (has no administration in time range)  traZODone (DESYREL) tablet 100 mg (has no administration in time range)  insulin glargine (LANTUS) injection 30 Units (has no administration in time range)  famotidine (PEPCID) tablet 20 mg (has no administration in time range)  ondansetron (ZOFRAN) tablet 4 mg (has no administration in time range)  gabapentin (NEURONTIN) capsule 400 mg (has no administration in time range)  albuterol (PROVENTIL) (2.5 MG/3ML) 0.083% nebulizer solution 3 mL (has no administration in time range)  ceFAZolin (ANCEF) IVPB 1 g/50 mL premix (has no administration in time range)   heparin injection 5,000 Units (has no administration in time range)  acetaminophen (TYLENOL) tablet 650 mg (has no administration in time range)    Or  acetaminophen (TYLENOL) suppository 650 mg (has no administration in time range)  oxyCODONE (Oxy IR/ROXICODONE) immediate release tablet 5 mg (has no administration in time range)  insulin aspart (novoLOG) injection 0-15 Units (has no administration in time range)  diltiazem (CARDIZEM CD) 24 hr capsule 120 mg (120 mg Oral Given 12/07/19 1306)  acetaminophen (TYLENOL) tablet 650 mg (650 mg Oral Given 12/07/19 1306)    ED Course  I have reviewed the triage vital signs and the nursing notes.  Pertinent labs & imaging results that were available during my care of the patient were reviewed by me and considered in my medical decision making (see chart for details).  Clinical Course as of Dec 07 1519  Tue Dec 07, 2019  1204 Spoke with Hilbert Odor, PA-C with orthopedics.  Will take it out the sutures that I can readily visualize and I will call him back regarding his inflammatory markers if abnormal.  Otherwise can treat as cellulitis and have patient follow up outpatient.    [GG]  1403 Sed rate was elevated to 48, but given lack of abscess or evidence of osteomyelitis, not an immediate concern at this point. After discussion with Hilbert Odor, PA-C, will either admit for IV antibiotics or discharge home with p.o. antibiotics and close orthopedic F/U.   [GG]  25 Spoke with Dr. Roosevelt Locks who agrees with concern for medication noncompliance and will evaluate patient.   [GG]    Clinical Course User Index [GG] Corena Herter, PA-C   MDM Rules/Calculators/A&P                      Patient was evaluated 08/18/2019 for similar complaints and it was concluded that his chest discomfort was secondary to prior sternotomy.  His D-dimer was well within normal limits and his initial troponin was normal at 7.  Given chronicity of current chest discomfort,  do not need to trend.  Glucose uncontrolled at 470 and patient's renal function poor, however only mildly worsened from baseline.  Patient's physical exam in conjunction with reported history is consistent with a cellulitis.  Removed 4 sutures from amputation site which were the only ones that were readily visible.  Sed rate elevated at 48.  Plain films obtained of foot demonstrate no definitive evidence of osteomyelitis.  However, given patient's cellulitis and setting of uncontrolled diabetes and patient with poor medication compliance, will consult hospitalist for admission for IV antibiotics.   Final Clinical Impression(s) / ED Diagnoses Final diagnoses:  Cellulitis of right lower extremity  Suicidal ideation    Rx / DC Orders ED Discharge Orders    None       Corena Herter, PA-C 12/07/19 1521    Virgel Manifold, MD 12/08/19 [phone]

## 2019-12-07 NOTE — ED Notes (Signed)
Pt transported to xray 

## 2019-12-07 NOTE — H&P (Signed)
History and Physical    Michael [name] MKL:[id number] DOB: 06/20/1951 DOA: 12/07/2019  PCP: [name], PA-C   Patient coming from: Home  I have personally briefly reviewed patient's old medical records in [place]  Chief Complaint: Right foot pain and swelling  HPI: Michael [name] is a 69 y.o. male with medical history significant of IDDM, Cocaine abuse, A. fib likely secondary to cocaine abuse, hypertension, CAD, history of noncompliance with medications presented with right foot surgical site pain and swelling for 1 month.  Denies any fever chills patient had right second toe amputation about 4 months ago [facility].  He lost follow-up, never had suture removed. Start from 1 month ago started to feel occasional pain of the right foot surgical site.  He stopped taking all his medication including insulin about 1-2 weeks ago, and well he still abuses cocaine and last use was last night.  Denies any chest pains.  ED Course: X-ray of right foot negative for gas or acute bone changes.  Elevated ESR, white count 4.6  Review of Systems: As per HPI otherwise 10 point review of systems negative.   Past Medical History:  Diagnosis Date  . A-fib ([place])   . Asthma    No PFTs, history of childhood asthma  . CAD (coronary artery disease)    a. 06/2013 STEMI/PCI ([facility]): LAD w/ thrombus (treated with BMS), mid 75%, D2 75%; LCX OM2 75%; RCA small, PDA 95%, PLV 95%;  b. 10/2013 Cath/PCI: ISR w/in LAD (Promus DES x 2), borderline OM2 lesion;  c. 01/2014 MV: Intermediate risk, medium-sized distal ant wall infarct w/ very small amt of peri-infarct ischemia. EF 60%.  . Cellulitis 04/2014   left facial  . Chondromalacia of medial femoral condyle    Left knee MRI 04/28/12: Chondromalacia of the medial femoral condyle with slight peripheral degeneration of the meniscocapsular junction of the medial meniscus; followed by sports medicine  . Collagen vascular disease ([place])   . Crack  cocaine use    for 20+ years, has been enrolled in detox programs in the past  . Depression    with history of hospitalization for suicidal ideation  . Diabetes mellitus 2002   Diagnosed in 2002, started insulin in 2012  . Gout   . Gout 04/28/2012  . Headache(784.0)    CT head 08/2011: Periventricular and subcortical white matter hypodensities are most in keeping with chronic microangiopathic change  . HIV infection [facility]) Nov 2012   Followed by [name]  . Hyperlipidemia   . Hypertension   . Pulmonary embolism [facility])     Past Surgical History:  Procedure Laterality Date  . AMPUTATION Right 07/21/2019   Procedure: RIGHT SECOND TOE AMPUTATION;  Surgeon: [name], MD;  Location: [facility];  Service: Orthopedics;  Laterality: Right;  . BACK SURGERY     1988  . BOWEL RESECTION    . CARDIAC SURGERY    . CERVICAL SPINE SURGERY     " rods in my neck "  . CORONARY ARTERY BYPASS GRAFT    . CORONARY STENT PLACEMENT    . NM MYOCAR PERF WALL MOTION  12/27/2011   normal  . SPINE SURGERY       reports that he has never smoked. He has never used smokeless tobacco. He reports current drug use. Frequency: 4.00 times per week. Drugs: "Crack" cocaine and Cocaine. He reports that he does not drink alcohol.  No Known Allergies  Family History  Problem Relation Age of Onset  . Diabetes Mother   . Hypertension Mother   . Hyperlipidemia Mother   . Diabetes Father   . Cancer Father   . Hypertension Father   . Diabetes Brother   . Heart disease Brother   . Diabetes Sister   . Colon cancer Neg Hx      Prior to Admission medications   Medication Sig Start Date End Date Taking? Authorizing Provider  albuterol (VENTOLIN HFA) 108 (90 Base) MCG/ACT inhaler Inhale 1-2 puffs into the lungs every 6 (six) hours as needed for wheezing or shortness of breath. 06/19/19  Yes Mallie Darting, NP  diltiazem (CARDIZEM CD) 120 MG 24 hr capsule Take 1 capsule (120 mg total) by mouth daily. For blood  pressure 07/30/19 12/07/19 Yes Elodia Florence., MD  famotidine (PEPCID) 20 MG tablet Take 1 tablet (20 mg total) by mouth 2 (two) times daily. For heart burn Patient taking differently: Take 20 mg by mouth 2 (two) times daily as needed for heartburn.  07/30/19 12/07/19 Yes Elodia Florence., MD  gabapentin (NEURONTIN) 400 MG capsule Take 1 capsule (400 mg total) by mouth 2 (two) times daily. For agitation/neuropathy Patient taking differently: Take 400 mg by mouth 2 (two) times daily as needed (agitation/neuropathy).  07/30/19 12/07/19 Yes Elodia Florence., MD  HUMALOG 100 UNIT/ML injection Inject 4-6 Units into the skin daily. 07/08/19  Yes [provider]  Insulin Glargine (LANTUS) 100 UNIT/ML Solostar Pen Inject 20 Units into the skin daily. Patient taking differently: Inject 30 Units into the skin daily.  07/30/19 12/07/19 Yes Elodia Florence., MD  metFORMIN (GLUCOPHAGE) 1000 MG tablet Take 1 tablet (1,000 mg total) by mouth daily. Patient taking differently: Take 1,000 mg by mouth 2 (two) times daily.  07/30/19 12/07/19 Yes Elodia Florence., MD  ondansetron (ZOFRAN) 4 MG tablet Take 1 tablet (4 mg total) by mouth every 8 (eight) hours as needed for nausea or vomiting. 08/21/19  Yes Cardama, Grayce Sessions, MD  sertraline (ZOLOFT) 50 MG tablet Take 1 tablet (50 mg total) by mouth daily. For depression 07/30/19 12/07/19 Yes Elodia Florence., MD  traZODone (DESYREL) 100 MG tablet Take 1 tablet (100 mg total) by mouth at bedtime as needed for sleep. 07/30/19 12/07/19 Yes Elodia Florence., MD  allopurinol (ZYLOPRIM) 100 MG tablet Take 100 mg by mouth daily as needed (gout).  07/30/19   [provider]  nitroGLYCERIN (NITROSTAT) 0.4 MG SL tablet Place 1 tablet (0.4 mg total) under the tongue every 5 (five) minutes as needed for chest pain. 06/19/19   Mallie Darting, NP    Physical Exam: Vitals:   12/07/19 1230 12/07/19 1300 12/07/19 1306 12/07/19 1315   BP: 140/86 (!) 144/92 (!) 144/92 (!) 159/99  Pulse: 80 76  93  Resp:    15  Temp:      TempSrc:      SpO2: 100% 99%  100%  Weight:      Height:        Constitutional: NAD, calm, comfortable Vitals:   12/07/19 1230 12/07/19 1300 12/07/19 1306 12/07/19 1315  BP: 140/86 (!) 144/92 (!) 144/92 (!) 159/99  Pulse: 80 76  93  Resp:    15  Temp:      TempSrc:      SpO2: 100% 99%  100%  Weight:      Height:       Eyes: PERRL, lids and  conjunctivae normal ENMT: Mucous membranes are moist. Posterior pharynx clear of any exudate or lesions.Normal dentition.  Neck: normal, supple, no masses, no thyromegaly Respiratory: clear to auscultation bilaterally, no wheezing, no crackles. Normal respiratory effort. No accessory muscle use.  Cardiovascular: Regular rate and rhythm, no murmurs / rubs / gallops. No extremity edema. 2+ pedal pulses. No carotid bruits.  Abdomen: no tenderness, no masses palpated. No hepatosplenomegaly. Bowel sounds positive.  Musculoskeletal: Right forefoot second toe stump side large eschar with surrounding erythema and tenderness, warm to touch.  Skin: no rashes, lesions, ulcers. No induration Neurologic: CN 2-12 grossly intact. Sensation intact, DTR normal. Strength 5/5 in all 4.  Psychiatric: Normal judgment and insight. Alert and oriented x 3. Normal mood.     Labs on Admission: I have personally reviewed following labs and imaging studies  CBC: Recent Labs  Lab 12/07/19 0940  WBC 4.6  HGB 11.4*  HCT 35.8*  MCV 79.0*  PLT 209   Basic Metabolic Panel: Recent Labs  Lab 12/07/19 0940  NA 135  K 4.0  CL 102  CO2 24  GLUCOSE 470*  BUN 29*  CREATININE 2.05*  CALCIUM 9.4   GFR: Estimated Creatinine Clearance: 32.9 mL/min (A) (by C-G formula based on SCr of 2.05 mg/dL (H)). Liver Function Tests: No results for input(s): AST, ALT, ALKPHOS, BILITOT, PROT, ALBUMIN in the last 168 hours. No results for input(s): LIPASE, AMYLASE in the last 168  hours. No results for input(s): AMMONIA in the last 168 hours. Coagulation Profile: No results for input(s): INR, PROTIME in the last 168 hours. Cardiac Enzymes: No results for input(s): CKTOTAL, CKMB, CKMBINDEX, TROPONINI in the last 168 hours. BNP (last 3 results) No results for input(s): PROBNP in the last 8760 hours. HbA1C: No results for input(s): HGBA1C in the last 72 hours. CBG: No results for input(s): GLUCAP in the last 168 hours. Lipid Profile: No results for input(s): CHOL, HDL, LDLCALC, TRIG, CHOLHDL, LDLDIRECT in the last 72 hours. Thyroid Function Tests: No results for input(s): TSH, T4TOTAL, FREET4, T3FREE, THYROIDAB in the last 72 hours. Anemia Panel: No results for input(s): VITAMINB12, FOLATE, FERRITIN, TIBC, IRON, RETICCTPCT in the last 72 hours. Urine analysis:    Component Value Date/Time   COLORURINE YELLOW 08/20/2019 2008   APPEARANCEUR HAZY (A) 08/20/2019 2008   LABSPEC 1.025 08/20/2019 2008   PHURINE 5.0 08/20/2019 2008   GLUCOSEU 50 (A) 08/20/2019 2008   HGBUR NEGATIVE 08/20/2019 2008   [place] NEGATIVE 08/20/2019 2008   [place] NEGATIVE 08/20/2019 2008   PROTEINUR 100 (A) 08/20/2019 2008   UROBILINOGEN 0.2 08/13/2015 1030   NITRITE NEGATIVE 08/20/2019 2008   LEUKOCYTESUR NEGATIVE 08/20/2019 2008    Radiological Exams on Admission: DG Chest 2 View  Result Date: 12/07/2019 CLINICAL DATA:  Chest pain EXAM: CHEST - 2 VIEW COMPARISON:  08/11/2019 FINDINGS: The heart size and mediastinal contours are within normal limits. Both lungs are clear. No pleural effusion or pneumothorax. No acute osseous abnormality. IMPRESSION: No acute process in the chest. Electronically Signed   By: Macy Mis M.D.   On: 12/07/2019 10:00   DG Foot Complete Right  Result Date: 12/07/2019 CLINICAL DATA:  Pain swelling and discharge at surgical site. History of second digit amputation. EXAM: RIGHT FOOT COMPLETE - 3+ VIEW COMPARISON:  07/16/2019, 07/17/2019 FINDINGS:  Postsurgical changes from prior second digit amputation at the level of the second MTP joint. There is prominence and irregularity of the soft tissues at the resection site. No definite soft tissue  gas. Osseous structures appear intact without acute fracture, cortical destruction, or periostitis. Chronic healed fractures of the fourth and fifth metatarsal necks. IMPRESSION: Prominence of the soft tissues at the resection site of the second digit amputation. No definite soft tissue gas. No radiographic evidence of osteomyelitis. Electronically Signed   By: Davina Poke D.O.   On: 12/07/2019 10:31    EKG: Independently reviewed.  Sinus arrhythmia Assessment/Plan Active Problems:   Cellulitis  Right foot cellulitis Review his culture, no history of resistant organism, and patient symptoms had lasted for a month without significant deterioration, and no systemic symptoms, and MRSA screen negative x2 in the past Will cover for non-MRSA cellulitis for now, but waiting for orthopedic surgery consultation. May need debridement given the thick eschar on top of the base of the second toe stump stump site Ancef for now, given the high risk of noncompliant with p.o. antibiotics.  Uncontrolled diabetes From noncompliance, resume insulin regimen plus sliding scale for now Hold Metformin for now in case patient will need IV contrast on this admission  Cocaine abuse No chest pain, on CCB.  We will consult transition care team  CKD stage III Likely related uncontrolled diabetes, creatinine level stable, no signs of fluid overload, no signs of acidosis or electrolyte imbalance.   DVT prophylaxis: Heparin subcu Code Status: Full code Family Communication: None at bedside Disposition Plan: Depends on orthopedic surgery with plan, likely can transition to p.o. antibiotics in 1 to 2 days Consults called: Dellis Filbert orthopedics PA Admission status: MedSurg admission   Lequita Halt MD [facility] Pager [phone]    12/07/2019, 2:56 PM

## 2019-12-07 NOTE — Progress Notes (Signed)
Patient arrived to room in NAD, VS stable and patient free from pain. Bed in lowest position and call bell in reach.

## 2019-12-07 NOTE — ED Triage Notes (Signed)
Pt reports chest pain for 1 month and blood oozing from his right foot where he had his second toe amputated about 4 months ago, sutures still intact. No bleeding noted, no swelling or redness noted. Pt a.o, ambulatory.

## 2019-12-07 NOTE — ED Notes (Signed)
Requested Pharmacy to adjust timing of Cardizem CD medication.

## 2019-12-08 ENCOUNTER — Encounter (HOSPITAL_COMMUNITY): Payer: Self-pay | Admitting: Internal Medicine

## 2019-12-08 DIAGNOSIS — F332 Major depressive disorder, recurrent severe without psychotic features: Secondary | ICD-10-CM

## 2019-12-08 DIAGNOSIS — Z794 Long term (current) use of insulin: Secondary | ICD-10-CM

## 2019-12-08 DIAGNOSIS — L03031 Cellulitis of right toe: Secondary | ICD-10-CM

## 2019-12-08 DIAGNOSIS — I10 Essential (primary) hypertension: Secondary | ICD-10-CM

## 2019-12-08 DIAGNOSIS — N1832 Chronic kidney disease, stage 3b: Secondary | ICD-10-CM

## 2019-12-08 DIAGNOSIS — E1140 Type 2 diabetes mellitus with diabetic neuropathy, unspecified: Secondary | ICD-10-CM

## 2019-12-08 DIAGNOSIS — D649 Anemia, unspecified: Secondary | ICD-10-CM

## 2019-12-08 DIAGNOSIS — E1121 Type 2 diabetes mellitus with diabetic nephropathy: Secondary | ICD-10-CM

## 2019-12-08 DIAGNOSIS — K219 Gastro-esophageal reflux disease without esophagitis: Secondary | ICD-10-CM

## 2019-12-08 DIAGNOSIS — E1165 Type 2 diabetes mellitus with hyperglycemia: Secondary | ICD-10-CM

## 2019-12-08 DIAGNOSIS — I480 Paroxysmal atrial fibrillation: Secondary | ICD-10-CM

## 2019-12-08 DIAGNOSIS — I2510 Atherosclerotic heart disease of native coronary artery without angina pectoris: Secondary | ICD-10-CM

## 2019-12-08 DIAGNOSIS — L02611 Cutaneous abscess of right foot: Secondary | ICD-10-CM

## 2019-12-08 DIAGNOSIS — F1420 Cocaine dependence, uncomplicated: Secondary | ICD-10-CM

## 2019-12-08 DIAGNOSIS — E785 Hyperlipidemia, unspecified: Secondary | ICD-10-CM

## 2019-12-08 HISTORY — DX: Cutaneous abscess of right foot: L03.031

## 2019-12-08 HISTORY — DX: Cellulitis of right toe: L02.611

## 2019-12-08 LAB — GLUCOSE, CAPILLARY
Glucose-Capillary: 302 mg/dL — ABNORMAL HIGH (ref 70–99)
Glucose-Capillary: 264 mg/dL — ABNORMAL HIGH (ref 70–99)
Glucose-Capillary: 111 mg/dL — ABNORMAL HIGH (ref 70–99)
Glucose-Capillary: 219 mg/dL — ABNORMAL HIGH (ref 70–99)

## 2019-12-08 LAB — CBC
HCT: 30 % — ABNORMAL LOW (ref 39.0–52.0)
Hemoglobin: 9.8 g/dL — ABNORMAL LOW (ref 13.0–17.0)
MCH: 26.1 pg (ref 26.0–34.0)
MCHC: 32.7 g/dL (ref 30.0–36.0)
MCV: 80 fL (ref 80.0–100.0)
Platelets: 142 10*3/uL — ABNORMAL LOW (ref 150–400)
RBC: 3.75 MIL/uL — ABNORMAL LOW (ref 4.22–5.81)
RDW: 13.7 % (ref 11.5–15.5)
WBC: 3.9 10*3/uL — ABNORMAL LOW (ref 4.0–10.5)
nRBC: 0 % (ref 0.0–0.2)

## 2019-12-08 LAB — BASIC METABOLIC PANEL
Anion gap: 10 (ref 5–15)
BUN: 26 mg/dL — ABNORMAL HIGH (ref 8–23)
CO2: 20 mmol/L — ABNORMAL LOW (ref 22–32)
Calcium: 8.5 mg/dL — ABNORMAL LOW (ref 8.9–10.3)
Chloride: 105 mmol/L (ref 98–111)
Creatinine, Ser: 1.77 mg/dL — ABNORMAL HIGH (ref 0.61–1.24)
GFR calc Af Amer: 44 mL/min — ABNORMAL LOW (ref >60)
GFR calc non Af Amer: 38 mL/min — ABNORMAL LOW (ref >60)
Glucose, Bld: 346 mg/dL — ABNORMAL HIGH (ref 70–99)
Potassium: 4.2 mmol/L (ref 3.5–5.1)
Sodium: 135 mmol/L (ref 135–145)

## 2019-12-08 MED ORDER — INSULIN ASPART 100 UNIT/ML ~~LOC~~ SOLN
4.0000 [IU] | Freq: Three times a day (TID) | SUBCUTANEOUS | Status: DC
  Administered 2019-12-08 – 2019-12-09 (×3): 4 [IU] via SUBCUTANEOUS

## 2019-12-08 NOTE — Progress Notes (Signed)
Assume care from [place], [place]. Patient AOX4 no distress

## 2019-12-08 NOTE — Progress Notes (Signed)
Assumed care from [place]. Patient AOx4. And comfortable in bed.

## 2019-12-08 NOTE — Progress Notes (Signed)
Inpatient Diabetes Program Recommendations  AACE/ADA: New Consensus Statement on Inpatient Glycemic Control (2015)  Target Ranges:  Prepandial:   less than 140 mg/dL      Peak postprandial:   less than 180 mg/dL (1-2 hours)      Critically ill patients:  140 - 180 mg/dL   Lab Results  Component Value Date   GLUCAP 111 (H) 12/08/2019   HGBA1C 13.0 (H) 12/07/2019    Review of Glycemic Control  Diabetes history: DM2 Outpatient Diabetes medications: Lantus 30 units daily+ Metformin 1000 mg BID + Humalog 4 units with meals Current orders for Inpatient glycemic control: Lantus 30 units daily + novolog 0-15 TID + Novolog 4 units TID with meals  Note:  Diabetes consult placed for diabetes education.  Spoke with patient at bedside.  Reviewed patient's current A1c of 13% (Average blood sugar 326 over the last 2-3 months). Explained what a A1c is and what it measures. Also reviewed goal A1c with patient, importance of good glucose control @ home, and blood sugar goals.  He has a right middle toe amputation from 4 months ago that has been infected and "oozing".  He has not been able to bare much weight and therefore ran out of medications and has not taken his insulin in 1-2 weeks.  He uses [facility] and encouraged him to have his medications delivered so he does not miss doses.  Reviewed importance of limiting CHO's .  He states he loves to eat sweets.  Reviewed Plate Method and hypoglycemia and treatment.  He checks his blood sugar twice daily.  Would benefit from having medications delivered to room from [place] prior to discharge.  Will continue to follow.  Thank you, Reche Dixon, RN, BSN Diabetes Coordinator Inpatient Diabetes Program [phone] (team pager from 8a-5p)

## 2019-12-08 NOTE — Progress Notes (Signed)
PROGRESS NOTE    Michael [name]  GNO:[id number] DOB: Mar 07, 1951 DOA: 12/07/2019 PCP: Michael Demark, PA-C    Brief Narrative:  HPI per Dr. Donne Anon [place] [name] is a 69 y.o. male with medical history significant of IDDM, Cocaine abuse, A. fib likely secondary to cocaine abuse, hypertension, CAD, history of noncompliance with medications presented with right foot surgical site pain and swelling for 1 month.  Denied any fever chills patient had right second toe amputation about 4 months ago [facility].  He lost follow-up, never had suture removed. Start from 1 month ago started to feel occasional pain of the right foot surgical site.  He stopped taking all his medication including insulin about 1-2 weeks ago, and well he still abuses cocaine and last use was last night.  Denies any chest pains.  ED Course: X-ray of right foot negative for gas or acute bone changes.  Elevated ESR, white count 4.6  Assessment & Plan:   Principal Problem:   Cellulitis Active Problems:   Uncontrolled type 2 diabetes with neuropathy ([name])   Hyperlipidemia with target LDL less than 100   3-vessel CAD   Cocaine use disorder, severe, dependence ([name])   Anemia   MDD (major depressive disorder), recurrent severe, without psychosis ([place])   Essential hypertension   Type II diabetes mellitus with renal manifestations ([name])   GERD (gastroesophageal reflux disease)   Paroxysmal A-fib ([place])  1 right foot cellulitis Patient symptoms ongoing x1 month with concern for cellulitis.  Patient was lost to follow-up.  Staples were not removed prior to admission however subsequently removed per ED physician's note.  Patient currently afebrile.  Patient noted to have an eschar on the top base of the second toe stump.  Erythema and tenderness to palpation improving.  Sed rate elevated at 48.  Plain films of the right foot with no signs of abscess formation or osteomyelitis.  Continue IV Ancef and likely  transition to oral Keflex on discharge.  Will need outpatient follow-up with orthopedics.  2.  Poorly controlled diabetes mellitus type 2 with neuropathy Secondary to noncompliance.  Patient noted to have been out of his medications for over 2 weeks and states was unable to reach PCPs office for refills.  Hemoglobin A1c 13.0.  Continue to hold oral hypoglycemic agents.  CBG of 302 this morning.  Continue current dose of Lantus 30 units daily.  Place on meal coverage NovoLog 4 units 3 times daily with meals.  Sliding scale insulin.  Continue Neurontin.  Consult with diabetic coordinator.  Importance of medication compliance stressed to patient.  3.  Chronic kidney disease stage IIIb Renal function currently stable.  Will need better control of diabetes.  Follow.  4.  Anemia Likely anemia of chronic disease.  Hemoglobin currently stable at 9.8.  Check an anemia panel.  Follow.  5.  Polysubstance abuse Polysubstance cessation stressed to patient.  Social work consulted.  6.  Paroxysmal atrial fibrillation/coronary artery disease status post CABG/hyperlipidemia Currently rate controlled on home regimen Cardizem.  Patient likely poor anticoagulation candidate due to noncompliance.  Check a fasting lipid panel.  Outpatient follow-up with cardiology.  7.  Depression Stable.  Continue home regimen Zoloft.  8.  Gastroesophageal reflux disease Continue Pepcid.  9.  Hypertension Blood pressure stable.  Continue Cardizem.   DVT prophylaxis: Heparin Code Status: Full Family Communication: Updated patient.  No family at bedside. Disposition Plan:  . Patient came from: Home            .  Anticipated d/c place: Likely home. . Barriers to d/c OR conditions which need to be met to effect a safe d/c: Home when clinically improved with improvement with blood glucose levels hopefully in the next 24 hours.   Consultants:   None  Procedures:   Plain films of the right foot 12/07/2019  Chest x-ray 2  12/07/2019  Antimicrobials:  IV Ancef 12/07/2019   Subjective: Patient laying in bed.  With some complaints of chest tightness and he states has been ongoing prior to his CABG with no significant change.  Denies any significant shortness of breath.  No nausea or vomiting.  States has been noncompliant and unable to get any of his medications prior to admission for several days.  Stated unable to get in touch with his PCP.  Objective: Vitals:   12/07/19 1849 12/08/19 0019 12/08/19 0558 12/08/19 1300  BP:  112/62 127/80 125/78  Pulse:  79 79 72  Resp:  '16 17 16  ' Temp:  98 F (36.7 C) 97.8 F (36.6 C) 98.2 F (36.8 C)  TempSrc:  Oral Oral Oral  SpO2:  99% 99% 100%  Weight: 72.1 kg     Height: '5\' 8"'  (1.727 m)       Intake/Output Summary (Last 24 hours) at 12/08/2019 1751 Last data filed at 12/08/2019 1305 Gross per 24 hour  Intake 350 ml  Output 1175 ml  Net -825 ml   Filed Weights   12/07/19 0926 12/07/19 1849  Weight: 74.4 kg 72.1 kg    Examination:  General exam: Appears calm and comfortable  Respiratory system: Clear to auscultation. Respiratory effort normal. Cardiovascular system: S1 & S2 heard, RRR. No JVD, murmurs, rubs, gallops or clicks. No pedal edema. Gastrointestinal system: Abdomen is nondistended, soft and nontender. No organomegaly or masses felt. Normal bowel sounds heard. Central nervous system: Alert and oriented. No focal neurological deficits. Extremities: Right forefoot with second toe stump with eschar noted.  Decreased erythema.  Decreased warmth to touch. Skin: No rashes, lesions or ulcers Psychiatry: Judgement and insight appear normal. Mood & affect appropriate.     Data Reviewed: I have personally reviewed following labs and imaging studies  CBC: Recent Labs  Lab 12/07/19 0940 12/08/19 0502  WBC 4.6 3.9*  HGB 11.4* 9.8*  HCT 35.8* 30.0*  MCV 79.0* 80.0  PLT 173 177*   Basic Metabolic Panel: Recent Labs  Lab 12/07/19 0940  12/08/19 0502  NA 135 135  K 4.0 4.2  CL 102 105  CO2 24 20*  GLUCOSE 470* 346*  BUN 29* 26*  CREATININE 2.05* 1.77*  CALCIUM 9.4 8.5*   GFR: Estimated Creatinine Clearance: 38.1 mL/min (A) (by C-G formula based on SCr of 1.77 mg/dL (H)). Liver Function Tests: No results for input(s): AST, ALT, ALKPHOS, BILITOT, PROT, ALBUMIN in the last 168 hours. No results for input(s): LIPASE, AMYLASE in the last 168 hours. No results for input(s): AMMONIA in the last 168 hours. Coagulation Profile: No results for input(s): INR, PROTIME in the last 168 hours. Cardiac Enzymes: No results for input(s): CKTOTAL, CKMB, CKMBINDEX, TROPONINI in the last 168 hours. BNP (last 3 results) No results for input(s): PROBNP in the last 8760 hours. HbA1C: Recent Labs    12/07/19 1057  HGBA1C 13.0*   CBG: Recent Labs  Lab 12/07/19 1517 12/07/19 1705 12/08/19 0624 12/08/19 1122 12/08/19 1644  GLUCAP 331* 347* 302* 264* 111*   Lipid Profile: No results for input(s): CHOL, HDL, LDLCALC, TRIG, CHOLHDL, LDLDIRECT in the last 72  hours. Thyroid Function Tests: No results for input(s): TSH, T4TOTAL, FREET4, T3FREE, THYROIDAB in the last 72 hours. Anemia Panel: No results for input(s): VITAMINB12, FOLATE, FERRITIN, TIBC, IRON, RETICCTPCT in the last 72 hours. Sepsis Labs: No results for input(s): PROCALCITON, LATICACIDVEN in the last 168 hours.  Recent Results (from the past 240 hour(s))  SARS CORONAVIRUS 2 (TAT 6-24 HRS) Nasopharyngeal Nasopharyngeal Swab     Status: None   Collection Time: 12/07/19  3:45 PM   Specimen: Nasopharyngeal Swab  Result Value Ref Range Status   SARS Coronavirus 2 NEGATIVE NEGATIVE Final    Comment: (NOTE) SARS-CoV-2 target nucleic acids are NOT DETECTED. The SARS-CoV-2 RNA is generally detectable in upper and lower respiratory specimens during the acute phase of infection. Negative results do not preclude SARS-CoV-2 infection, do not rule out co-infections with other  pathogens, and should not be used as the sole basis for treatment or other patient management decisions. Negative results must be combined with clinical observations, patient history, and epidemiological information. The expected result is Negative. Fact Sheet for Patients: SugarRoll.be Fact Sheet for Healthcare Providers: https://www.woods-mathews.com/ This test is not yet approved or cleared by the [place] FDA and  has been authorized for detection and/or diagnosis of SARS-CoV-2 by FDA under an Emergency Use Authorization (EUA). This EUA will remain  in effect (meaning this test can be used) for the duration of the COVID-19 declaration under Section 56 4(b)(1) of the Act, 21 U.S.C. section 360bbb-3(b)(1), unless the authorization is terminated or revoked sooner. Performed at [facility], [place] [address], [place], [place] [address]          Radiology Studies: DG Chest 2 View  Result Date: 12/07/2019 CLINICAL DATA:  Chest pain EXAM: CHEST - 2 VIEW COMPARISON:  08/11/2019 FINDINGS: The heart size and mediastinal contours are within normal limits. Both lungs are clear. No pleural effusion or pneumothorax. No acute osseous abnormality. IMPRESSION: No acute process in the chest. Electronically Signed   By: Macy Mis M.D.   On: 12/07/2019 10:00   DG Foot Complete Right  Result Date: 12/07/2019 CLINICAL DATA:  Pain swelling and discharge at surgical site. History of second digit amputation. EXAM: RIGHT FOOT COMPLETE - 3+ VIEW COMPARISON:  07/16/2019, 07/17/2019 FINDINGS: Postsurgical changes from prior second digit amputation at the level of the second MTP joint. There is prominence and irregularity of the soft tissues at the resection site. No definite soft tissue gas. Osseous structures appear intact without acute fracture, cortical destruction, or periostitis. Chronic healed fractures of the fourth and fifth metatarsal necks.  IMPRESSION: Prominence of the soft tissues at the resection site of the second digit amputation. No definite soft tissue gas. No radiographic evidence of osteomyelitis. Electronically Signed   By: Davina Poke D.O.   On: 12/07/2019 10:31        Scheduled Meds: . diltiazem  120 mg Oral Daily  . heparin  5,000 Units Subcutaneous Q8H  . insulin aspart  0-15 Units Subcutaneous TID WC  . insulin aspart  4 Units Subcutaneous TID WC  . insulin glargine  30 Units Subcutaneous Daily  . sertraline  50 mg Oral Daily   Continuous Infusions: .  ceFAZolin (ANCEF) IV 1 g (12/08/19 1502)     LOS: 1 day    Time spent: 35 minutes    Irine Seal, MD [facility]   To contact the attending provider between 7A-7P or the covering provider during after hours 7P-7A, please log into the web  site [email or web address] and access using universal [place] password for that web site. If you do not have the password, please call the hospital operator.  12/08/2019, 5:51 PM

## 2019-12-09 LAB — MAGNESIUM: Magnesium: 1.9 mg/dL (ref 1.7–2.4)

## 2019-12-09 LAB — LIPID PANEL
Cholesterol: 196 mg/dL (ref 0–200)
HDL: 49 mg/dL (ref >40)
LDL Cholesterol: 120 mg/dL — ABNORMAL HIGH (ref 0–99)
Total CHOL/HDL Ratio: 4 RATIO
Triglycerides: 134 mg/dL (ref <150)
VLDL: 27 mg/dL (ref 0–40)

## 2019-12-09 LAB — CBC WITH DIFFERENTIAL/PLATELET
Abs Immature Granulocytes: 0.03 10*3/uL (ref 0.00–0.07)
Basophils Absolute: 0 10*3/uL (ref 0.0–0.1)
Basophils Relative: 1 %
Eosinophils Absolute: 0.1 10*3/uL (ref 0.0–0.5)
Eosinophils Relative: 2 %
HCT: 32 % — ABNORMAL LOW (ref 39.0–52.0)
Hemoglobin: 10.3 g/dL — ABNORMAL LOW (ref 13.0–17.0)
Immature Granulocytes: 1 %
Lymphocytes Relative: 37 %
Lymphs Abs: 1.4 10*3/uL (ref 0.7–4.0)
MCH: 25.9 pg — ABNORMAL LOW (ref 26.0–34.0)
MCHC: 32.2 g/dL (ref 30.0–36.0)
MCV: 80.4 fL (ref 80.0–100.0)
Monocytes Absolute: 0.3 10*3/uL (ref 0.1–1.0)
Monocytes Relative: 9 %
Neutro Abs: 2 10*3/uL (ref 1.7–7.7)
Neutrophils Relative %: 50 %
Platelets: 139 10*3/uL — ABNORMAL LOW (ref 150–400)
RBC: 3.98 MIL/uL — ABNORMAL LOW (ref 4.22–5.81)
RDW: 13.7 % (ref 11.5–15.5)
WBC: 3.8 10*3/uL — ABNORMAL LOW (ref 4.0–10.5)
nRBC: 0 % (ref 0.0–0.2)

## 2019-12-09 LAB — GLUCOSE, CAPILLARY
Glucose-Capillary: 149 mg/dL — ABNORMAL HIGH (ref 70–99)
Glucose-Capillary: 215 mg/dL — ABNORMAL HIGH (ref 70–99)
Glucose-Capillary: 98 mg/dL (ref 70–99)

## 2019-12-09 LAB — FOLATE: Folate: 11.8 ng/mL (ref >5.9)

## 2019-12-09 LAB — BASIC METABOLIC PANEL
Anion gap: 9 (ref 5–15)
BUN: 21 mg/dL (ref 8–23)
CO2: 23 mmol/L (ref 22–32)
Calcium: 8.4 mg/dL — ABNORMAL LOW (ref 8.9–10.3)
Chloride: 107 mmol/L (ref 98–111)
Creatinine, Ser: 1.6 mg/dL — ABNORMAL HIGH (ref 0.61–1.24)
GFR calc Af Amer: 50 mL/min — ABNORMAL LOW (ref >60)
GFR calc non Af Amer: 43 mL/min — ABNORMAL LOW (ref >60)
Glucose, Bld: 215 mg/dL — ABNORMAL HIGH (ref 70–99)
Potassium: 3.9 mmol/L (ref 3.5–5.1)
Sodium: 139 mmol/L (ref 135–145)

## 2019-12-09 LAB — FERRITIN: Ferritin: 26 ng/mL (ref 24–336)

## 2019-12-09 LAB — IRON AND TIBC
Iron: 39 ug/dL — ABNORMAL LOW (ref 45–182)
Saturation Ratios: 10 % — ABNORMAL LOW (ref 17.9–39.5)
TIBC: 374 ug/dL (ref 250–450)
UIBC: 335 ug/dL

## 2019-12-09 LAB — VITAMIN B12: Vitamin B-12: 273 pg/mL (ref 180–914)

## 2019-12-09 MED ORDER — TRAZODONE HCL 100 MG PO TABS: 100.0000 mg | ORAL_TABLET | Freq: Every evening | ORAL | 0 refills | Status: DC | PRN

## 2019-12-09 MED ORDER — INSULIN LISPRO (1 UNIT DIAL) 100 UNIT/ML (KWIKPEN): 4.0000 [IU] | PEN_INJECTOR | Freq: Three times a day (TID) | SUBCUTANEOUS | 0 refills | Status: DC

## 2019-12-09 MED ORDER — ALBUTEROL SULFATE HFA 108 (90 BASE) MCG/ACT IN AERS: 1.0000 | INHALATION_SPRAY | Freq: Four times a day (QID) | RESPIRATORY_TRACT | 1 refills | Status: DC | PRN

## 2019-12-09 MED ORDER — GABAPENTIN 400 MG PO CAPS: 400.0000 mg | ORAL_CAPSULE | Freq: Two times a day (BID) | ORAL | 1 refills | Status: DC | PRN

## 2019-12-09 MED ORDER — INSULIN PEN NEEDLE 31G X 5 MM MISC: 4.0000 [IU] | Freq: Three times a day (TID) | 0 refills | Status: DC

## 2019-12-09 MED ORDER — ATORVASTATIN CALCIUM 40 MG PO TABS: 40.0000 mg | ORAL_TABLET | Freq: Every day | ORAL | 1 refills | Status: DC

## 2019-12-09 MED ORDER — FAMOTIDINE 40 MG PO TABS: 40.0000 mg | ORAL_TABLET | Freq: Every day | ORAL | 1 refills | Status: DC

## 2019-12-09 MED ORDER — TRAMADOL HCL 50 MG PO TABS: 50.0000 mg | ORAL_TABLET | Freq: Four times a day (QID) | ORAL | Status: DC | PRN

## 2019-12-09 MED ORDER — DILTIAZEM HCL ER COATED BEADS 120 MG PO CP24: 120.0000 mg | ORAL_CAPSULE | Freq: Every day | ORAL | 1 refills | Status: DC

## 2019-12-09 MED ORDER — INSULIN ASPART 100 UNIT/ML FLEXPEN: 4.0000 [IU] | PEN_INJECTOR | Freq: Three times a day (TID) | SUBCUTANEOUS | 1 refills | Status: DC

## 2019-12-09 MED ORDER — ATORVASTATIN CALCIUM 40 MG PO TABS
40.0000 mg | ORAL_TABLET | Freq: Every day | ORAL | Status: DC
  Administered 2019-12-09: 40 mg via ORAL
  Filled 2019-12-09: qty 1

## 2019-12-09 MED ORDER — CYANOCOBALAMIN 1000 MCG/ML IJ SOLN
1000.0000 ug | Freq: Every day | INTRAMUSCULAR | Status: DC
  Administered 2019-12-09: 1000 ug via SUBCUTANEOUS
  Filled 2019-12-09: qty 1

## 2019-12-09 MED ORDER — TRAMADOL HCL 50 MG PO TABS: 50.0000 mg | ORAL_TABLET | Freq: Four times a day (QID) | ORAL | 0 refills | Status: DC | PRN

## 2019-12-09 MED ORDER — INSULIN GLARGINE 100 UNIT/ML SOLOSTAR PEN: 30.0000 [IU] | PEN_INJECTOR | Freq: Every day | SUBCUTANEOUS | 1 refills | Status: DC

## 2019-12-09 MED ORDER — SERTRALINE HCL 50 MG PO TABS: 50.0000 mg | ORAL_TABLET | Freq: Every day | ORAL | 1 refills | Status: DC

## 2019-12-09 MED ORDER — NITROGLYCERIN 0.4 MG SL SUBL: 0.4000 mg | SUBLINGUAL_TABLET | SUBLINGUAL | 0 refills | Status: DC | PRN

## 2019-12-09 MED ORDER — CEPHALEXIN 500 MG PO CAPS: 500.0000 mg | ORAL_CAPSULE | Freq: Two times a day (BID) | ORAL | Status: DC

## 2019-12-09 MED ORDER — CEPHALEXIN 500 MG PO CAPS: 500.0000 mg | ORAL_CAPSULE | Freq: Two times a day (BID) | ORAL | 0 refills | Status: AC

## 2019-12-09 MED FILL — traMADol HCL 50 MG TABS: 50 | 3 days supply | Qty: 10 | Fill #0

## 2019-12-09 MED FILL — CARTIA XT 120 MG CP24: 120 | 30 days supply | Qty: 30 | Fill #0

## 2019-12-09 MED FILL — SERTRALINE HCL 50 MG TABLET: 50 | 30 days supply | Qty: 30 | Fill #0

## 2019-12-09 MED FILL — CEPHALEXIN 500 MG CAPS: 500 | 4 days supply | Qty: 8 | Fill #0

## 2019-12-09 MED FILL — LANTUS SOLOSTAR 100 UNITS/M: 100 | 30 days supply | Qty: 9 | Fill #0

## 2019-12-09 MED FILL — HUMALOG 100 UNITS/ML KWIKPE: 100 | 25 days supply | Qty: 3 | Fill #0

## 2019-12-09 MED FILL — ATORVASTATIN CALCIUM 40 MG: 40 | 30 days supply | Qty: 30 | Fill #0

## 2019-12-09 MED FILL — PENTIPS 32G X 4 MM MISC: 32G X 4 MM | 25 days supply | Qty: 100 | Fill #0

## 2019-12-09 NOTE — Care Management (Signed)
Spoke with the patient's primary nurse on [facility] and the nurse is planning on sending the patient home to the [facility] in [place], [place].

## 2019-12-09 NOTE — Evaluation (Signed)
Physical Therapy Evaluation Patient Details Name: Michael [name] MRN: [id number] DOB: 13-Jan-1951 Today's Date: 12/09/2019   History of Present Illness  69 y.o. male with medical history significant of IDDM, Cocaine abuse, A. fib likely secondary to cocaine abuse, hypertension, CAD, history of noncompliance with medications, s/p amputation 2nd toe on R foot October 2020 presented with right foot surgical site pain and swelling for 1 month. Dx of R foot cellulitis.  Clinical Impression  Pt admitted with above diagnosis. Pt ambulated 110' holding on to handrail, no loss of balance. Distance limited by 8/10 pain in R foot, pain meds requested. Pt stated he thinks the place where he rented a room PTA has bedbugs, he has itchy arms and legs. RN notified as pt is not currently on contact precautions. He also mentions 2 months of blurry vision, OT aware and will further assess.  Pt currently with functional limitations due to the deficits listed below (see PT Problem List). Pt will benefit from skilled PT to increase their independence and safety with mobility to allow discharge to the venue listed below.       Follow Up Recommendations Home health PT    Equipment Recommendations  Cane    Recommendations for Other Services       Precautions / Restrictions Precautions Precautions: Fall Precaution Comments: pt denies falls in past 6 months Restrictions Weight Bearing Restrictions: No      Mobility  Bed Mobility Overal bed mobility: Modified Independent             General bed mobility comments: HOB up, used rail  Transfers Overall transfer level: Needs assistance Equipment used: None Transfers: Sit to/from Stand Sit to Stand: Supervision         General transfer comment: supervision for safety, no LOB  Ambulation/Gait Ambulation/Gait assistance: Min guard Gait Distance (Feet): 110 Feet Assistive device: (handrail in hall) Gait Pattern/deviations: Decreased stride  length Gait velocity: decr   General Gait Details: held on to rail in hall, declined RW, no LOB  Stairs            Wheelchair Mobility    Modified Rankin (Stroke Patients Only)       Balance Overall balance assessment: Needs assistance   Sitting balance-Leahy Scale: Good     Standing balance support: Single extremity supported Standing balance-Leahy Scale: Fair Standing balance comment: single UE support for dynamic standing                             Pertinent Vitals/Pain Pain Assessment: 0-10 Pain Score: 8  Pain Location: R foot with walking Pain Descriptors / Indicators: Sore Pain Intervention(s): Limited activity within patient's tolerance;Monitored during session;Patient requesting pain meds-RN notified    Home Living Family/patient expects to be discharged to:: Unsure     Type of Home: Other(Comment)("rents a room" but doesn't plan to return there, wants to DC to motel)         Home Equipment: None Additional Comments: had a cane but lost it    Prior Function Level of Independence: Independent         Comments: held on to furniture/walls, used cane until he lost it, reports I with ADLs; pt stated the room he rented may have bedbugs so he doesn't want to return there, wants to find a new place to live, stated he would DC to a motel     Hand Dominance  Extremity/Trunk Assessment   Upper Extremity Assessment Upper Extremity Assessment: Defer to OT evaluation    Lower Extremity Assessment Lower Extremity Assessment: RLE deficits/detail RLE Deficits / Details: knee ext 4/5, ankle DF at least 3/5 did not attempt MMT 2* pain RLE Sensation: WNL RLE Coordination: WNL    Cervical / Trunk Assessment Cervical / Trunk Assessment: Normal  Communication   Communication: No difficulties  Cognition Arousal/Alertness: Awake/alert Behavior During Therapy: WFL for tasks assessed/performed Overall Cognitive Status: Within Functional  Limits for tasks assessed                                        General Comments General comments (skin integrity, edema, etc.): pt reports 2 months of blurred vision, informed OT who will further assess    Exercises     Assessment/Plan    PT Assessment Patient needs continued PT services  PT Problem List Decreased activity tolerance;Decreased balance       PT Treatment Interventions Gait training    PT Goals (Current goals can be found in the Care Plan section)  Acute Rehab PT Goals Patient Stated Goal: to walk with a cane, wants to find a new place to live PT Goal Formulation: With patient Time For Goal Achievement: 12/23/19 Potential to Achieve Goals: Good    Frequency Min 3X/week   Barriers to discharge        Co-evaluation               AM-PAC PT "6 Clicks" Mobility  Outcome Measure Help needed turning from your back to your side while in a flat bed without using bedrails?: None Help needed moving from lying on your back to sitting on the side of a flat bed without using bedrails?: None Help needed moving to and from a bed to a chair (including a wheelchair)?: None Help needed standing up from a chair using your arms (e.g., wheelchair or bedside chair)?: None Help needed to walk in hospital room?: A Little Help needed climbing 3-5 steps with a railing? : A Little 6 Click Score: 22    End of Session Equipment Utilized During Treatment: Gait belt Activity Tolerance: Patient tolerated treatment well Patient left: in chair;with call bell/phone within reach;with chair alarm set Nurse Communication: Mobility status PT Visit Diagnosis: Difficulty in walking, not elsewhere classified (R26.2)    Time: 6195-0932 PT Time Calculation (min) (ACUTE ONLY): 25 min   Charges:   PT Evaluation $PT Eval Low Complexity: 1 Low PT Treatments $Gait Training: 8-22 mins        Blondell Reveal Kistler PT 12/09/2019  Acute Rehabilitation  Services Pager [phone] Office [phone]

## 2019-12-09 NOTE — Progress Notes (Signed)
RN gave pt discharge instructions and he stated understanding I went over how to administer insulin with him and also watched him demonstrate insulin administration on himself. Pt home [facility]. IV has been removed and pt is getting dressed. Pt told me that he wants a taxi voucher to go to the [facility] in [place] and that he will call his sister tomorrow for her to come and pick him up.

## 2019-12-09 NOTE — Evaluation (Signed)
Occupational Therapy Evaluation Patient Details Name: Michael [name] MRN: [id number] DOB: 09/18/1951 Today's Date: 12/09/2019    History of Present Illness 69 y.o. male with medical history significant of IDDM, Cocaine abuse, A. fib likely secondary to cocaine abuse, hypertension, CAD, history of noncompliance with medications, s/p amputation 2nd toe on R foot October 2020 presented with right foot surgical site pain and swelling for 1 month. Dx of R foot cellulitis.   Clinical Impression   Pt admitted with the above diagnoses and presents with below problem list. Pt will benefit from continued acute OT to address the below listed deficits and maximize independence with basic ADLs prior to d/c to venue below. At baseline, pt operates at mod I level with basic ADLs though clearly he is struggling with IADLs such as medication management. Noted some cognitive deficits, no family present to confirm baseline. Recommend [place] OT and addition of [facility] nurse services for wound care and medication management to reduce the risk of hospital readmission due to medical noncompliance.      Follow Up Recommendations  Home health OT, [facility] nurse    Equipment Recommendations  None recommended by OT    Recommendations for Other Services       Precautions / Restrictions Precautions Precautions: Fall Precaution Comments: pt denies falls in past 6 months Restrictions Weight Bearing Restrictions: No      Mobility Bed Mobility Overal bed mobility: Modified Independent             General bed mobility comments: HOB up, used rail  Transfers Overall transfer level: Needs assistance Equipment used: None Transfers: Sit to/from Stand Sit to Stand: Supervision         General transfer comment: supervision for safety, no LOB    Balance Overall balance assessment: Needs assistance   Sitting balance-Leahy Scale: Good     Standing balance support: Single extremity supported Standing  balance-Leahy Scale: Fair Standing balance comment: single UE support for dynamic standing                           ADL either performed or assessed with clinical judgement   ADL Overall ADL's : Needs assistance/impaired Eating/Feeding: Set up;Sitting   Grooming: Supervision/safety;Wash/dry face;Wash/dry hands;Oral care;Standing Grooming Details (indicate cue type and reason): standby/supervision due to decreased activity tolerance, generalized weakness Upper Body Bathing: Set up;Sitting   Lower Body Bathing: Supervison/ safety;Sit to/from stand   Upper Body Dressing : Set up;Sitting   Lower Body Dressing: Supervision/safety;Sit to/from stand   Toilet Transfer: Supervision/safety;Ambulation   Toileting- Clothing Manipulation and Hygiene: Supervision/safety   Tub/ Shower Transfer: Supervision/safety;Min guard   Functional mobility during ADLs: Min guard(single extremity support) General ADL Comments: Pt seeking single extremity support in standing and reliance on furniture/wall for support in lieu of cane, declined rw. Pt stood for 3 grooming tasks, external support of sink utilized. Pt r/o feeling wiped out, "no energy."     Vision Patient Visual Report: Blurring of vision;Other (comment)(gradual onset over the past few weeks) Vision Assessment?: Yes Eye Alignment: Within Functional Limits Ocular Range of Motion: Within Functional Limits Tracking/Visual Pursuits: Able to track stimulus in all quads without difficulty Visual Fields: No apparent deficits Diplopia Assessment: Present all the time/all directions Additional Comments: per MD blurred vision likely related to uncontrolled DM     Perception     Praxis      Pertinent Vitals/Pain Pain Assessment: Faces Pain Score: 8  Faces Pain Scale: Hurts  little more Pain Location: R foot with walking Pain Descriptors / Indicators: Sore Pain Intervention(s): Monitored during session;Repositioned     Hand  Dominance Right   Extremity/Trunk Assessment Upper Extremity Assessment Upper Extremity Assessment: Generalized weakness;Overall WFL for tasks assessed   Lower Extremity Assessment Lower Extremity Assessment: Defer to PT evaluation RLE Deficits / Details: knee ext 4/5, ankle DF at least 3/5 did not attempt MMT 2* pain RLE Sensation: WNL RLE Coordination: WNL   Cervical / Trunk Assessment Cervical / Trunk Assessment: Normal   Communication Communication Communication: No difficulties   Cognition Arousal/Alertness: Awake/alert Behavior During Therapy: WFL for tasks assessed/performed Overall Cognitive Status: No family/caregiver present to determine baseline cognitive functioning                                 General Comments: some problem solving and STM deficits noted   General Comments  pt reports 2 months of blurred vision, informed OT who will further assess    Exercises     Shoulder Instructions      Home Living Family/patient expects to be discharged to:: Unsure Living Arrangements: Non-relatives/Friends   Type of Home: Other(Comment)("rents a room" but doesn't plan to return there, wants to DC)                       Home Equipment: None   Additional Comments: had a cane but lost it      Prior Functioning/Environment Level of Independence: Independent;Independent with assistive device(s)        Comments: held on to furniture/walls, used cane until he lost it, reports I with ADLs; pt stated the room he rented may have bedbugs so he doesn't want to return there, wants to find a new place to live, stated he would DC to a motel        OT Problem List: Decreased strength;Decreased activity tolerance;Impaired balance (sitting and/or standing);Decreased knowledge of use of DME or AE;Decreased knowledge of precautions;Decreased safety awareness;Pain      OT Treatment/Interventions: Self-care/ADL training;Therapeutic exercise;Energy  conservation;DME and/or AE instruction;Therapeutic activities;Cognitive remediation/compensation;Patient/family education;Balance training    OT Goals(Current goals can be found in the care plan section) Acute Rehab OT Goals Patient Stated Goal: to walk with a cane, wants to find a new place to live OT Goal Formulation: With patient Time For Goal Achievement: 12/23/19 Potential to Achieve Goals: Good ADL Goals Pt Will Perform Grooming: with modified independence;standing Pt Will Perform Lower Body Bathing: with modified independence;sit to/from stand Pt Will Perform Lower Body Dressing: with modified independence;sit to/from stand Pt Will Perform Tub/Shower Transfer: with modified independence;ambulating Additional ADL Goal #1: Pt will indpendently verbalize 2 energy conservation strategies to incorporate into ADLs.  OT Frequency: Min 2X/week   Barriers to D/C: Other (comment);Decreased caregiver support  housing insecurity. plans to d/c to motel.        Co-evaluation              AM-PAC OT "6 Clicks" Daily Activity     Outcome Measure Help from another person eating meals?: None Help from another person taking care of personal grooming?: A Little Help from another person toileting, which includes using toliet, bedpan, or urinal?: None Help from another person bathing (including washing, rinsing, drying)?: A Little Help from another person to put on and taking off regular upper body clothing?: None Help from another person to put on and taking off regular  lower body clothing?: A Little 6 Click Score: 21   End of Session    Activity Tolerance: Patient tolerated treatment well;Patient limited by fatigue Patient left: in bed;with call bell/phone within reach;with bed alarm set;Other (comment)(sitting EOB eating lunch)  OT Visit Diagnosis: Unsteadiness on feet (R26.81);Pain;Other symptoms and signs involving cognitive function;Muscle weakness (generalized) (M62.81)                 Time: 8375-4237 OT Time Calculation (min): 22 min Charges:  OT General Charges $OT Visit: 1 Visit OT Evaluation $OT Eval Low Complexity: [place], OT Acute Rehabilitation Services Pager: [phone] Office: [phone]   Hortencia Pilar 12/09/2019, 1:52 PM

## 2019-12-09 NOTE — Progress Notes (Addendum)
Inpatient Diabetes Program Recommendations  AACE/ADA: New Consensus Statement on Inpatient Glycemic Control (2015)  Target Ranges:  Prepandial:   less than 140 mg/dL      Peak postprandial:   less than 180 mg/dL (1-2 hours)      Critically ill patients:  140 - 180 mg/dL   Lab Results  Component Value Date   GLUCAP 215 (H) 12/09/2019   HGBA1C 13.0 (H) 12/07/2019    Diabetes history: DM2 Outpatient Diabetes medications: Lantus 30 units daily+ Metformin 1000 mg BID + Humalog 4 units with meals Current orders for Inpatient glycemic control: Lantus 30 units daily + novolog 0-15 TID + Novolog 4 units TID with meals  Inpatient Diabetes Program Recommendations:     Novolog 0-5 units QHS  Thank you, Reche Dixon, RN, BSN Diabetes Coordinator Inpatient Diabetes Program [phone] (team pager from 8a-5p)

## 2019-12-09 NOTE — Care Management (Signed)
Spoke with Dr. Grandville [name] and scheduled a physical appointment with Michael Mire, NP (PCP) and Dr. Sharol [name] as listed in the AVS prior to patient being discharged to home.  Patient will need to have a friend provide transportation home today if able since that patient is unsure as to which hotel is going to be staying in.  He states he will not return to his listed address due to bed bug infestation.  Left message on patient's chart at his PCP to followup if patient is still needing Michael [name] services.  Spoke with [place], [place], [facility], [facility], [facility], [place], and [facility] and all agencies were unable to provide him with [facility] PT, OT, RN due to staffing and [employer].

## 2019-12-09 NOTE — TOC Transition Note (Addendum)
Transition of Care [facility]) - CM/SW Discharge Note   Patient Details  Name: Michael [name] MRN: [id number] Date of Birth: Mar 03, 1951  Transition of Care [facility]) CM/SW Contact:  Curlene Labrum, RN Phone Number: 12/09/2019, 2:00 PM   Clinical Narrative:     Michael [name] a 69 y.o.malewith medical history significant ofIDDM, Cocaine abuse,A. fib likely secondary to cocaine abuse, hypertension, CAD, history of noncompliance with medications presented with right foot surgical site pain and swelling for 1 month.  Case Management spoke with the patient today prior to transition to home planned for today.  Patient does not plan to return to his present address at this time due to bed bugs being present in the home.  He plans to rent a hotel room for the next few months in [place], [place] until he finds a more sanitary place to stay.  [name] states that his sister, Michael [name], is the main point of contact at [phone] since the patient misplaced his phone prior to being admitted to the hospital.  His sister, Michael [name], will be transporting the patient to his new hotel upon discharge today according to the patient.  Patient was given Medicare choice regarding University services and DME and the patient chose 1. [facility], 2. [facility], 3.[facility], unfortunately these chosen companies did not have availability for the patient's services.  Patient states he does not have a preference and I will continue to try the home health agencies.  Due to the patient currently living situation, recent cocaine use, and insurance have made this difficult. Patient was willing and agreeable to seek alcohol and drug cessation assistance and the patient was given [place] area support in this regard.  Patient currently uses [facility] for medication prescriptions.  He does not drive but his sister transports him for errands.  Patient also sees Dr. Altamease Oiler and Dr. Jillyn Hidden in [place], [place] for followup  medicine appointments as needed.  No other barriers were identified and I will continue to seek [facility] services resolution.  12/09/19 - 1501 - Spoke with the patient on the phone after initial assessment and the patient is aware that he received a rolling walker from [employer] in October, 2021 - the hospital cost out of pocket for a cane would be $25, and the patient states that he would prefer to get cheaper cane from [facility].  Patient states that he will not be returning to his current listed address and he will be aquiring a hotel room to rent in a nearby city, but possibly not [place].  Patient was uncertain which hotel and city he would be staying in but refused the [employer] cane.  I also spoke to the sister at the patient's request to discuss the patient's plans post hospitalization and the sister states she is out of town and has not seen the patient since October 2021 after his surgery.  She states that the patient does not stay in the same hotels, moves around frequently and is noncompliant with medications and cessation from drugs and alcohol.  The sister states that he has burned bridges with all of the siblings due to his noncompliance.  The sister explained that the best place for him would be a long term nursing home but the patient is unwilling.  I spoke with Dr. Grandville Silos and discussed that above matter regarding living situation, lack of medication compliance, history of drug use and compliance, inability of find home health services and refusal of private pay at the hospital for the  cane at the hospital.  Dr. Grandville Silos plans on filling the patients medications for 30 days at the [facility] and having appointments set up with Dr. [name] and [name] at [facility].    Final next level of care: Home w Hospice Care Barriers to Discharge: Barriers Resolved, Family Issues(awaiting [place] agency to acceptance patient's insurance, living situation and recent use of cocaine on 3/1)   Patient Goals and CMS  Choice Patient states their goals for this hospitalization and ongoing recovery are:: I'm ready to go home, feel better, and get to a new place to stay. CMS Medicare.gov Compare Post Acute Care list provided to:: Patient Choice offered to / list presented to : Patient  Discharge Placement                       Discharge Plan and Services   Discharge Planning Services: CM Consult Post Acute Care Choice: Durable Medical Equipment, Home Health          DME Arranged: [name] DME Agency: [facility] Date DME Agency Contacted: 12/09/19 Time DME Agency Contacted: [phone] Representative spoke with at DME Agency: [place]: RN, PT, OT [facility] Agency: (waiting for return acceptance from [facility] agency)        Social Determinants of Health (SDOH) Interventions     Readmission Risk Interventions Readmission Risk Prevention Plan 12/09/2019 07/19/2019  Transportation Screening Complete Complete  Medication Review [employer]) Complete Complete  PCP or Specialist appointment within 3-5 days of discharge Complete -  [place] or [place] Complete Complete  SW Recovery Care/Counseling Consult Complete -  Palliative Care Screening Not Applicable Not [place] Not Applicable -  Some recent data might be hidden

## 2019-12-09 NOTE — Discharge Summary (Signed)
Physician Discharge Summary  Michael [name] ZVJ:[id number] DOB: 1951/01/02 DOA: 12/07/2019  PCP: [name], PA-C  Admit date: 12/07/2019 Discharge date: 12/09/2019  Time spent: 55 minutes  Recommendations for Outpatient Follow-up:  1. [facility] with [facility].  On follow-up patient's diabetes will need to be reassessed.  Patient will need a basic metabolic profile done to follow-up on electrolytes and renal function. 2. Follow-up with Dr. [name] orthopedics.   Discharge Diagnoses:  Principal Problem:   Cellulitis Active Problems:   Uncontrolled type 2 diabetes with neuropathy ([name])   Hyperlipidemia with target LDL less than 100   3-vessel CAD   Cocaine use disorder, severe, dependence ([name])   Anemia   MDD (major depressive disorder), recurrent severe, without psychosis ([place])   Essential hypertension   Type II diabetes mellitus with renal manifestations ([place])   GERD (gastroesophageal reflux disease)   Paroxysmal A-fib ([place])   Discharge Condition: Stable and improved  Diet recommendation: Carb modified diet  Filed Weights   12/07/19 0926 12/07/19 1849  Weight: 74.4 kg 72.1 kg    History of present illness:  HPI per Dr. Donne Anon Michael [name] is a 69 y.o. male with medical history significant of IDDM, Cocaine abuse, A. fib likely secondary to cocaine abuse, hypertension, CAD, history of noncompliance with medications presented with right foot surgical site pain and swelling for 1 month.  Denied any fever chills patient had right second toe amputation about 4 months ago [facility].  He lost follow-up, never had suture removed. Start from 1 month ago started to feel occasional pain of the right foot surgical site.  He stopped taking all his medication including insulin about 1-2 weeks ago, and well he still abuses cocaine and last use was last night.  Denied any chest pains.  ED Course: X-ray of right foot negative for gas or acute bone changes.   Elevated ESR, white count 4.6.  Hospital Course:  1 right foot cellulitis Patient symptoms ongoing x1 month with concern for cellulitis.  Patient was lost to follow-up.  Staples were not removed prior to admission however subsequently removed per ED physician's note.  Patient remained afebrile.  Patient noted to have an eschar on the top base of the second toe stump. Sed rate elevated at 48.  Plain films of the right foot with no signs of abscess formation or osteomyelitis.  Patient placed on IV Ancef and improved clinically.  Patient subsequently transition to oral Keflex and be discharged home on 4 more days of oral Keflex to complete a course of antibiotic treatment.  Outpatient follow-up with PCP and orthopedics.   2.  Poorly controlled diabetes mellitus type 2 with neuropathy Secondary to noncompliance.  Patient noted to have been out of his medications for over 2 weeks and states was unable to reach PCPs office for refills.  Hemoglobin A1c 13.0.    Patient's oral hypoglycemic agents were discontinued.  Patient started on Lantus at 30 units daily as well as placed on meal coverage NovoLog 4 units 3 times daily as well as a sliding scale insulin.  Blood glucose levels improved.  Patient was maintained on Neurontin.  Patient was seen by diabetic coordinator.  Importance of medication compliance stressed to patient.  Patient with discharge on Lantus 30 units daily as well as meal coverage NovoLog 4 units 3 times daily.  Outpatient follow-up with PCP.   3.  Chronic kidney disease stage IIIb Renal function remained stable.  Outpatient follow-up.   4.  Anemia of chronic disease Likely anemia of chronic disease.  Hemoglobin remained stable at 10.3.  Outpatient follow-up.  5.  Polysubstance abuse Polysubstance cessation stressed to patient.  Social work consulted.  6.  Paroxysmal atrial fibrillation/coronary artery disease status post CABG/hyperlipidemia Remained rate controlled on home regimen  of Cardizem.  Patient with medication noncompliance.  Patient likely poor anticoagulation candidate due to medication noncompliance.    Fasting lipid panel obtained had a LDL of 120.  Patient started on a statin.  Outpatient follow-up.   7.  Depression Patient maintained on home regimen of Zoloft.   8.  Gastroesophageal reflux disease Patient maintained on Pepcid.  Outpatient follow-up.  9.  Hypertension Blood pressure remained stable.  Patient maintained on home regimen of Cardizem.  10.  Hyperlipidemia Fasting lipid panel obtained noting the LDL of 120.  Patient started on a statin.  Outpatient follow-up.   Procedures:  Plain films of the right foot 12/07/2019  Chest x-ray 2 12/07/2019  Consultations:  None  Discharge Exam: Vitals:   12/09/19 0538 12/09/19 1154  BP: 124/87 132/83  Pulse: 84 80  Resp: 18 15  Temp: 97.9 F (36.6 C) 98 F (36.7 C)  SpO2: 99% 100%    General: NAD Cardiovascular: RRR Respiratory: CTAB  Discharge Instructions   Discharge Instructions    Diet Carb Modified   Complete by: As directed    Increase activity slowly   Complete by: As directed      Allergies as of 12/09/2019   No Known Allergies     Medication List    STOP taking these medications   HumaLOG 100 UNIT/ML injection Generic drug: insulin lispro   metFORMIN 1000 MG tablet Commonly known as: GLUCOPHAGE     TAKE these medications   albuterol 108 (90 Base) MCG/ACT inhaler Commonly known as: VENTOLIN HFA Inhale 1-2 puffs into the lungs every 6 (six) hours as needed for wheezing or shortness of breath.   allopurinol 100 MG tablet Commonly known as: ZYLOPRIM Take 100 mg by mouth daily as needed (gout).   atorvastatin 40 MG tablet Commonly known as: LIPITOR Take 1 tablet (40 mg total) by mouth daily at 6 PM.   cephALEXin 500 MG capsule Commonly known as: KEFLEX Take 1 capsule (500 mg total) by mouth every 12 (twelve) hours for 4 days.   diltiazem 120 MG 24 hr  capsule Commonly known as: CARDIZEM CD Take 1 capsule (120 mg total) by mouth daily. For blood pressure   famotidine 40 MG tablet Commonly known as: PEPCID Take 1 tablet (40 mg total) by mouth daily. What changed:   medication strength  how much to take  when to take this  additional instructions   gabapentin 400 MG capsule Commonly known as: NEURONTIN Take 1 capsule (400 mg total) by mouth 2 (two) times daily as needed (agitation/neuropathy).   insulin aspart 100 UNIT/ML FlexPen Commonly known as: NOVOLOG Inject 4 Units into the skin 3 (three) times daily with meals.   insulin glargine 100 UNIT/ML Solostar Pen Commonly known as: LANTUS Inject 30 Units into the skin daily.   Insulin Pen Needle 31G X 5 MM Misc 4 Units by Does not apply route in the morning, at noon, and at bedtime.   nitroGLYCERIN 0.4 MG SL tablet Commonly known as: NITROSTAT Place 1 tablet (0.4 mg total) under the tongue every 5 (five) minutes as needed for chest pain.   ondansetron 4 MG tablet Commonly known as: ZOFRAN Take 1 tablet (4 mg total)  by mouth every 8 (eight) hours as needed for nausea or vomiting.   sertraline 50 MG tablet Commonly known as: ZOLOFT Take 1 tablet (50 mg total) by mouth daily. For depression   traMADol 50 MG tablet Commonly known as: ULTRAM Take 1 tablet (50 mg total) by mouth every 6 (six) hours as needed for moderate pain.   traZODone 100 MG tablet Commonly known as: DESYREL Take 1 tablet (100 mg total) by mouth at bedtime as needed for sleep.            Durable Medical Equipment  (From admission, onward)         Start     Ordered   12/09/19 1222  For home use only DME Cane  Once     12/09/19 1221         No Known Allergies Follow-up Information    [facility] Follow up.   Why: Please call and make an appointment for follow-up with your family physician, Dr. Altamease Oiler. Contact information: [place] [address] [phone]       [name], MD. Go on 12/14/2019.   Specialty: Orthopedic Surgery Why: You have a scheduled appointment on 12/14/2019 at 1345.  Please make arrangements for transportations to this appointment. Contact information: [address] [place] [address] [phone]            The results of significant diagnostics from this hospitalization (including imaging, microbiology, ancillary and laboratory) are listed below for reference.    Significant Diagnostic Studies: DG Chest 2 View  Result Date: 12/07/2019 CLINICAL DATA:  Chest pain EXAM: CHEST - 2 VIEW COMPARISON:  08/11/2019 FINDINGS: The heart size and mediastinal contours are within normal limits. Both lungs are clear. No pleural effusion or pneumothorax. No acute osseous abnormality. IMPRESSION: No acute process in the chest. Electronically Signed   By: Macy Mis M.D.   On: 12/07/2019 10:00   DG Foot Complete Right  Result Date: 12/07/2019 CLINICAL DATA:  Pain swelling and discharge at surgical site. History of second digit amputation. EXAM: RIGHT FOOT COMPLETE - 3+ VIEW COMPARISON:  07/16/2019, 07/17/2019 FINDINGS: Postsurgical changes from prior second digit amputation at the level of the second MTP joint. There is prominence and irregularity of the soft tissues at the resection site. No definite soft tissue gas. Osseous structures appear intact without acute fracture, cortical destruction, or periostitis. Chronic healed fractures of the fourth and fifth metatarsal necks. IMPRESSION: Prominence of the soft tissues at the resection site of the second digit amputation. No definite soft tissue gas. No radiographic evidence of osteomyelitis. Electronically Signed   By: Davina Poke D.O.   On: 12/07/2019 10:31    Microbiology: Recent Results (from the past 240 hour(s))  SARS CORONAVIRUS 2 (TAT 6-24 HRS) Nasopharyngeal Nasopharyngeal Swab     Status: None   Collection Time: 12/07/19   3:45 PM   Specimen: Nasopharyngeal Swab  Result Value Ref Range Status   SARS Coronavirus 2 NEGATIVE NEGATIVE Final    Comment: (NOTE) SARS-CoV-2 target nucleic acids are NOT DETECTED. The SARS-CoV-2 RNA is generally detectable in upper and lower respiratory specimens during the acute phase of infection. Negative results do not preclude SARS-CoV-2 infection, do not rule out co-infections with other pathogens, and should not be used as the sole basis for treatment or other patient management decisions. Negative results must be combined with clinical observations, patient history, and epidemiological information. The expected result is Negative.  Fact Sheet for Patients: SugarRoll.be Fact Sheet for Healthcare Providers: https://www.woods-mathews.com/ This test is not yet approved or cleared by the [place] FDA and  has been authorized for detection and/or diagnosis of SARS-CoV-2 by FDA under an Emergency Use Authorization (EUA). This EUA will remain  in effect (meaning this test can be used) for the duration of the COVID-19 declaration under Section 56 4(b)(1) of the Act, 21 U.S.C. section 360bbb-3(b)(1), unless the authorization is terminated or revoked sooner. Performed at [facility], [place] [address] [place], [place] [address]      Labs: Basic Metabolic Panel: Recent Labs  Lab 12/07/19 0940 12/08/19 0502 12/09/19 0323  NA 135 135 139  K 4.0 4.2 3.9  CL 102 105 107  CO2 24 20* 23  GLUCOSE 470* 346* 215*  BUN 29* 26* 21  CREATININE 2.05* 1.77* 1.60*  CALCIUM 9.4 8.5* 8.4*  MG  --   --  1.9   Liver Function Tests: No results for input(s): AST, ALT, ALKPHOS, BILITOT, PROT, ALBUMIN in the last 168 hours. No results for input(s): LIPASE, AMYLASE in the last 168 hours. No results for input(s): AMMONIA in the last 168 hours. CBC: Recent Labs  Lab 12/07/19 0940 12/08/19 0502 12/09/19 0323  WBC 4.6 3.9* 3.8*   NEUTROABS  --   --  2.0  HGB 11.4* 9.8* 10.3*  HCT 35.8* 30.0* 32.0*  MCV 79.0* 80.0 80.4  PLT 173 142* 139*   Cardiac Enzymes: No results for input(s): CKTOTAL, CKMB, CKMBINDEX, TROPONINI in the last 168 hours. BNP: BNP (last 3 results) Recent Labs    04/18/19 0330 04/19/19 0310 07/17/19 0043  BNP 68.9 76.1 146.1*    ProBNP (last 3 results) No results for input(s): PROBNP in the last 8760 hours.  CBG: Recent Labs  Lab 12/08/19 1122 12/08/19 1644 12/08/19 2147 12/09/19 0601 12/09/19 1038  GLUCAP 264* 111* 219* 149* 215*       Signed:  Irine Seal MD.  [facility] 12/09/2019, 3:42 PM

## 2019-12-10 ENCOUNTER — Telehealth: Payer: Self-pay

## 2019-12-10 NOTE — Telephone Encounter (Signed)
   Called pt at [phone], Unable to reach/ Left message to call back/ Name and contact nr provided/   Reached Patient authorized:  Michael [name]  son   At [phone] / Stated neither him or his aunt  does not know anything about his father for the past week and a half/ stated his father kept changing phones and was not able to keep in touch with him. Instructed son to call back the office if he gets in contact with his father.Phone number and contact info provided.  Verbalized understanding

## 2019-12-12 ENCOUNTER — Other Ambulatory Visit: Payer: Self-pay

## 2019-12-12 ENCOUNTER — Encounter (HOSPITAL_COMMUNITY): Payer: Self-pay

## 2019-12-12 ENCOUNTER — Emergency Department (HOSPITAL_COMMUNITY): Payer: Medicare Other

## 2019-12-12 ENCOUNTER — Emergency Department (HOSPITAL_COMMUNITY)
Admission: EM | Admit: 2019-12-12 | Discharge: 2019-12-12 | Disposition: A | Discharge status: Home / Self Care | Payer: Medicare Other | Attending: Emergency Medicine | Admitting: Emergency Medicine

## 2019-12-12 ENCOUNTER — Emergency Department (HOSPITAL_COMMUNITY)
Admission: EM | Admit: 2019-12-12 | Discharge: 2019-12-13 | Disposition: A | Discharge status: Home / Self Care | Payer: Medicare Other | Source: Home / Self Care | Attending: Emergency Medicine | Admitting: Emergency Medicine

## 2019-12-12 DIAGNOSIS — E119 Type 2 diabetes mellitus without complications: Secondary | ICD-10-CM | POA: Insufficient documentation

## 2019-12-12 DIAGNOSIS — Z794 Long term (current) use of insulin: Secondary | ICD-10-CM | POA: Insufficient documentation

## 2019-12-12 DIAGNOSIS — B20 Human immunodeficiency virus [HIV] disease: Secondary | ICD-10-CM | POA: Diagnosis not present

## 2019-12-12 DIAGNOSIS — R45851 Suicidal ideations: Secondary | ICD-10-CM

## 2019-12-12 DIAGNOSIS — J45909 Unspecified asthma, uncomplicated: Secondary | ICD-10-CM | POA: Insufficient documentation

## 2019-12-12 DIAGNOSIS — Z86711 Personal history of pulmonary embolism: Secondary | ICD-10-CM | POA: Insufficient documentation

## 2019-12-12 DIAGNOSIS — R0789 Other chest pain: Secondary | ICD-10-CM | POA: Diagnosis not present

## 2019-12-12 DIAGNOSIS — F1910 Other psychoactive substance abuse, uncomplicated: Secondary | ICD-10-CM

## 2019-12-12 DIAGNOSIS — F333 Major depressive disorder, recurrent, severe with psychotic symptoms: Secondary | ICD-10-CM | POA: Insufficient documentation

## 2019-12-12 DIAGNOSIS — Z20822 Contact with and (suspected) exposure to covid-19: Secondary | ICD-10-CM | POA: Insufficient documentation

## 2019-12-12 DIAGNOSIS — I2510 Atherosclerotic heart disease of native coronary artery without angina pectoris: Secondary | ICD-10-CM | POA: Insufficient documentation

## 2019-12-12 DIAGNOSIS — F1410 Cocaine abuse, uncomplicated: Secondary | ICD-10-CM | POA: Insufficient documentation

## 2019-12-12 DIAGNOSIS — I10 Essential (primary) hypertension: Secondary | ICD-10-CM | POA: Diagnosis not present

## 2019-12-12 DIAGNOSIS — Z5900 Homelessness unspecified: Secondary | ICD-10-CM

## 2019-12-12 DIAGNOSIS — F1414 Cocaine abuse with cocaine-induced mood disorder: Secondary | ICD-10-CM | POA: Diagnosis present

## 2019-12-12 DIAGNOSIS — I130 Hypertensive heart and chronic kidney disease with heart failure and stage 1 through stage 4 chronic kidney disease, or unspecified chronic kidney disease: Secondary | ICD-10-CM | POA: Insufficient documentation

## 2019-12-12 DIAGNOSIS — Z79899 Other long term (current) drug therapy: Secondary | ICD-10-CM | POA: Insufficient documentation

## 2019-12-12 DIAGNOSIS — R079 Chest pain, unspecified: Secondary | ICD-10-CM | POA: Insufficient documentation

## 2019-12-12 DIAGNOSIS — Z8616 Personal history of COVID-19: Secondary | ICD-10-CM | POA: Insufficient documentation

## 2019-12-12 DIAGNOSIS — E1122 Type 2 diabetes mellitus with diabetic chronic kidney disease: Secondary | ICD-10-CM | POA: Insufficient documentation

## 2019-12-12 DIAGNOSIS — I509 Heart failure, unspecified: Secondary | ICD-10-CM | POA: Insufficient documentation

## 2019-12-12 DIAGNOSIS — Z951 Presence of aortocoronary bypass graft: Secondary | ICD-10-CM | POA: Insufficient documentation

## 2019-12-12 DIAGNOSIS — N183 Chronic kidney disease, stage 3 unspecified: Secondary | ICD-10-CM | POA: Insufficient documentation

## 2019-12-12 DIAGNOSIS — F1420 Cocaine dependence, uncomplicated: Secondary | ICD-10-CM | POA: Insufficient documentation

## 2019-12-12 DIAGNOSIS — Z21 Asymptomatic human immunodeficiency virus [HIV] infection status: Secondary | ICD-10-CM | POA: Insufficient documentation

## 2019-12-12 LAB — BASIC METABOLIC PANEL
Anion gap: 18 — ABNORMAL HIGH (ref 5–15)
BUN: 37 mg/dL — ABNORMAL HIGH (ref 8–23)
CO2: 16 mmol/L — ABNORMAL LOW (ref 22–32)
Calcium: 9 mg/dL (ref 8.9–10.3)
Chloride: 102 mmol/L (ref 98–111)
Creatinine, Ser: 2.39 mg/dL — ABNORMAL HIGH (ref 0.61–1.24)
GFR calc Af Amer: 31 mL/min — ABNORMAL LOW (ref >60)
GFR calc non Af Amer: 27 mL/min — ABNORMAL LOW (ref >60)
Glucose, Bld: 248 mg/dL — ABNORMAL HIGH (ref 70–99)
Potassium: 5.1 mmol/L (ref 3.5–5.1)
Sodium: 136 mmol/L (ref 135–145)

## 2019-12-12 LAB — COMPREHENSIVE METABOLIC PANEL
ALT: 13 U/L (ref 0–44)
AST: 32 U/L (ref 15–41)
Albumin: 3.5 g/dL (ref 3.5–5.0)
Alkaline Phosphatase: 65 U/L (ref 38–126)
Anion gap: 13 (ref 5–15)
BUN: 48 mg/dL — ABNORMAL HIGH (ref 8–23)
CO2: 21 mmol/L — ABNORMAL LOW (ref 22–32)
Calcium: 8.5 mg/dL — ABNORMAL LOW (ref 8.9–10.3)
Chloride: 98 mmol/L (ref 98–111)
Creatinine, Ser: 2.24 mg/dL — ABNORMAL HIGH (ref 0.61–1.24)
GFR calc Af Amer: 33 mL/min — ABNORMAL LOW (ref >60)
GFR calc non Af Amer: 29 mL/min — ABNORMAL LOW (ref >60)
Glucose, Bld: 356 mg/dL — ABNORMAL HIGH (ref 70–99)
Potassium: 4.4 mmol/L (ref 3.5–5.1)
Sodium: 132 mmol/L — ABNORMAL LOW (ref 135–145)
Total Bilirubin: 0.6 mg/dL (ref 0.3–1.2)
Total Protein: 7 g/dL (ref 6.5–8.1)

## 2019-12-12 LAB — TROPONIN I (HIGH SENSITIVITY)
Troponin I (High Sensitivity): 7 ng/L (ref <18)
Troponin I (High Sensitivity): 8 ng/L (ref <18)
Troponin I (High Sensitivity): 8 ng/L (ref <18)

## 2019-12-12 LAB — CBC WITH DIFFERENTIAL/PLATELET
Abs Immature Granulocytes: 0.04 10*3/uL (ref 0.00–0.07)
Basophils Absolute: 0 10*3/uL (ref 0.0–0.1)
Basophils Relative: 1 %
Eosinophils Absolute: 0.1 10*3/uL (ref 0.0–0.5)
Eosinophils Relative: 1 %
HCT: 37.5 % — ABNORMAL LOW (ref 39.0–52.0)
Hemoglobin: 11.4 g/dL — ABNORMAL LOW (ref 13.0–17.0)
Immature Granulocytes: 1 %
Lymphocytes Relative: 17 %
Lymphs Abs: 0.8 10*3/uL (ref 0.7–4.0)
MCH: 25.6 pg — ABNORMAL LOW (ref 26.0–34.0)
MCHC: 30.4 g/dL (ref 30.0–36.0)
MCV: 84.3 fL (ref 80.0–100.0)
Monocytes Absolute: 0.2 10*3/uL (ref 0.1–1.0)
Monocytes Relative: 5 %
Neutro Abs: 3.4 10*3/uL (ref 1.7–7.7)
Neutrophils Relative %: 75 %
Platelets: 157 10*3/uL (ref 150–400)
RBC: 4.45 MIL/uL (ref 4.22–5.81)
RDW: 14.2 % (ref 11.5–15.5)
WBC: 4.6 10*3/uL (ref 4.0–10.5)
nRBC: 0 % (ref 0.0–0.2)

## 2019-12-12 LAB — ACETAMINOPHEN LEVEL: Acetaminophen (Tylenol), Serum: <10 ug/mL — ABNORMAL LOW (ref 10–30)

## 2019-12-12 LAB — CBC
HCT: 31.6 % — ABNORMAL LOW (ref 39.0–52.0)
Hemoglobin: 10.1 g/dL — ABNORMAL LOW (ref 13.0–17.0)
MCH: 25.8 pg — ABNORMAL LOW (ref 26.0–34.0)
MCHC: 32 g/dL (ref 30.0–36.0)
MCV: 80.6 fL (ref 80.0–100.0)
Platelets: 159 10*3/uL (ref 150–400)
RBC: 3.92 MIL/uL — ABNORMAL LOW (ref 4.22–5.81)
RDW: 14.3 % (ref 11.5–15.5)
WBC: 5.3 10*3/uL (ref 4.0–10.5)
nRBC: 0 % (ref 0.0–0.2)

## 2019-12-12 LAB — SALICYLATE LEVEL: Salicylate Lvl: <7 mg/dL — ABNORMAL LOW (ref 7.0–30.0)

## 2019-12-12 LAB — ETHANOL: Alcohol, Ethyl (B): <10 mg/dL (ref <10)

## 2019-12-12 MED ORDER — INSULIN ASPART 100 UNIT/ML ~~LOC~~ SOLN
4.0000 [IU] | Freq: Three times a day (TID) | SUBCUTANEOUS | Status: DC
  Administered 2019-12-13: 4 [IU] via SUBCUTANEOUS
  Filled 2019-12-12: qty 0.04

## 2019-12-12 MED ORDER — SERTRALINE HCL 50 MG PO TABS
50.0000 mg | ORAL_TABLET | Freq: Every day | ORAL | Status: DC
  Administered 2019-12-13: 50 mg via ORAL
  Filled 2019-12-12: qty 1

## 2019-12-12 MED ORDER — ACETAMINOPHEN 325 MG PO TABS: 650.0000 mg | ORAL_TABLET | ORAL | Status: DC | PRN

## 2019-12-12 MED ORDER — DILTIAZEM HCL ER COATED BEADS 120 MG PO CP24
120.0000 mg | ORAL_CAPSULE | Freq: Every day | ORAL | Status: DC
  Administered 2019-12-13: 120 mg via ORAL
  Filled 2019-12-12: qty 1

## 2019-12-12 MED ORDER — ALUM & MAG HYDROXIDE-SIMETH 200-200-20 MG/5ML PO SUSP: 30.0000 mL | Freq: Four times a day (QID) | ORAL | Status: DC | PRN

## 2019-12-12 MED ORDER — FAMOTIDINE 20 MG PO TABS
40.0000 mg | ORAL_TABLET | Freq: Every day | ORAL | Status: DC
  Administered 2019-12-13: 40 mg via ORAL
  Filled 2019-12-12: qty 2

## 2019-12-12 MED ORDER — GABAPENTIN 400 MG PO CAPS: 400.0000 mg | ORAL_CAPSULE | Freq: Two times a day (BID) | ORAL | Status: DC | PRN

## 2019-12-12 MED ORDER — INSULIN GLARGINE 100 UNIT/ML ~~LOC~~ SOLN
30.0000 [IU] | Freq: Every day | SUBCUTANEOUS | Status: DC
  Administered 2019-12-13: 30 [IU] via SUBCUTANEOUS
  Filled 2019-12-12: qty 0.3

## 2019-12-12 MED ORDER — ATORVASTATIN CALCIUM 40 MG PO TABS
40.0000 mg | ORAL_TABLET | Freq: Every day | ORAL | Status: DC
  Filled 2019-12-12: qty 1

## 2019-12-12 MED ORDER — TRAZODONE HCL 100 MG PO TABS: 100.0000 mg | ORAL_TABLET | Freq: Every evening | ORAL | Status: DC | PRN

## 2019-12-12 NOTE — ED Provider Notes (Signed)
[facility] EMERGENCY DEPARTMENT Provider Note   CSN: [id number] Arrival date & time: 12/12/19  0055     History Chief Complaint  Patient presents with  . Chest Pain    Michael [name] is a 69 y.o. male.  Patient presents to the emergency department for evaluation of chest pain.  Patient reports that pain began 1 hour ago after smoking crack cocaine.  He reports the pain as 9 out of 10, sharp and in the center of his chest.  Patient comes to the ER from a local hotel by EMS.  He has received aspirin and nitro, no change in his pain.     HPI: A 69 year old patient with a history of treated diabetes, hypertension and hypercholesterolemia presents for evaluation of chest pain. Initial onset of pain was approximately 1-3 hours ago. The patient's chest pain is sharp and is not worse with exertion. The patient's chest pain is middle- or left-sided, is not well-localized, is not described as heaviness/pressure/tightness and does not radiate to the arms/jaw/neck. The patient does not complain of nausea and denies diaphoresis. The patient has no history of stroke, has no history of peripheral artery disease, has not smoked in the past 90 days, has no relevant family history of coronary artery disease (first degree relative at less than age 50) and does not have an elevated BMI (>=30).   Past Medical History:  Diagnosis Date  . A-fib ([place])   . Asthma    No PFTs, history of childhood asthma  . CAD (coronary artery disease)    a. 06/2013 STEMI/PCI ([facility]): LAD w/ thrombus (treated with BMS), mid 75%, D2 75%; LCX OM2 75%; RCA small, PDA 95%, PLV 95%;  b. 10/2013 Cath/PCI: ISR w/in LAD (Promus DES x 2), borderline OM2 lesion;  c. 01/2014 MV: Intermediate risk, medium-sized distal ant wall infarct w/ very small amt of peri-infarct ischemia. EF 60%.  . Cellulitis 04/2014   left facial  . Cellulitis and abscess of toe of right foot 12/08/2019  . Chondromalacia of medial femoral condyle     Left knee MRI 04/28/12: Chondromalacia of the medial femoral condyle with slight peripheral degeneration of the meniscocapsular junction of the medial meniscus; followed by sports medicine  . Collagen vascular disease ([place])   . Crack cocaine use    for 20+ years, has been enrolled in detox programs in the past  . Depression    with history of hospitalization for suicidal ideation  . Diabetes mellitus 2002   Diagnosed in 2002, started insulin in 2012  . Gout   . Gout 04/28/2012  . Headache(784.0)    CT head 08/2011: Periventricular and subcortical white matter hypodensities are most in keeping with chronic microangiopathic change  . HIV infection [facility]) Nov 2012   Followed by [name]  . Hyperlipidemia   . Hypertension   . Pulmonary embolism [facility])     Patient Active Problem List   Diagnosis Date Noted  . Cellulitis 12/07/2019  . Osteomyelitis ([place]) 07/17/2019  . Abscess or cellulitis of toe, right   . Toe pain, right-second   . MDD (major depressive disorder), recurrent episode, severe ([place]) 06/15/2019  . Respiratory distress 04/12/2019  . Pneumonia due to severe acute respiratory syndrome coronavirus 2 (SARS-CoV-2) 04/11/2019  . COVID-19 virus infection 03/19/2019  . Chest pain 10/03/2018  . Malnutrition of moderate degree 09/21/2018  . MDD (major depressive disorder), recurrent episode, moderate ([place])   . Type II diabetes mellitus with renal manifestations ([place]) 05/04/2018  .  GERD (gastroesophageal reflux disease) 05/04/2018  . S/P CABG (coronary artery bypass graft)   . Left testicular pain   . Depression 02/20/2018  . Epididymo-orchitis, acute 02/20/2018  . Essential hypertension 02/20/2018  . Precordial chest pain 02/20/2018  . AKI (acute kidney injury) ([place]) 02/14/2018  . HCAP (healthcare-associated pneumonia) 02/14/2018  . History of pulmonary embolism 07/04/2017  . Acute hyponatremia 05/11/2017  . Hyperglycemia due to type 2 diabetes mellitus ([place]) 05/11/2017   . CHF (congestive heart failure) ([place]) 04/01/2017  . Hepatitis C 12/30/2016  . Chronic ischemic heart disease 11/12/2016  . Paroxysmal A-fib ([name]) 11/12/2016  . Back pain 04/18/2016  . S/P carotid endarterectomy 11/15/2015  . MDD (major depressive disorder), recurrent severe, without psychosis ([place]) 09/09/2015  . Cocaine-induced mood disorder ([place]) 08/14/2015  . Cocaine abuse with cocaine-induced mood disorder ([place]) 08/14/2015  . Gout 07/10/2015  . Acute renal failure superimposed on stage 3 chronic kidney disease ([place]) 03/06/2015  . Anemia 03/06/2015  . Chronic kidney disease, stage III (moderate) 03/06/2015  . Hypoglycemia   . Encounter for general adult medical examination with abnormal findings 02/09/2015  . Cocaine use disorder, severe, dependence ([place]) 12/13/2014  . Substance or medication-induced depressive disorder with onset during withdrawal ([place]) 12/13/2014  . Severe recurrent major depressive disorder with psychotic features ([place]) 12/12/2014  . Cervicalgia 06/28/2014  . Lumbar radiculopathy, chronic 06/28/2014  . Asthma, chronic 02/03/2014  . 3-vessel CAD 06/24/2013  . ED (erectile dysfunction) of organic origin 07/07/2012  . Hypertension goal BP (blood pressure) < 140/80 04/29/2012  . Chondromalacia of left knee 03/19/2012  . Hyperlipidemia with target LDL less than 100 02/12/2012  . Fibromyalgia 02/12/2012  . HIV (human immunodeficiency virus infection) ([place]) 08/27/2011  . Uncontrolled type 2 diabetes with neuropathy ([place]) 10/17/2000    Past Surgical History:  Procedure Laterality Date  . AMPUTATION Right 07/21/2019   Procedure: RIGHT SECOND TOE AMPUTATION;  Surgeon: [name], MD;  Location: [facility];  Service: Orthopedics;  Laterality: Right;  . BACK SURGERY     1988  . BOWEL RESECTION    . CARDIAC SURGERY    . CERVICAL SPINE SURGERY     " rods in my neck "  . CORONARY ARTERY BYPASS GRAFT    . CORONARY STENT PLACEMENT    . NM MYOCAR PERF WALL MOTION   12/27/2011   normal  . SPINE SURGERY         Family History  Problem Relation Age of Onset  . Diabetes Mother   . Hypertension Mother   . Hyperlipidemia Mother   . Diabetes Father   . Cancer Father   . Hypertension Father   . Diabetes Brother   . Heart disease Brother   . Diabetes Sister   . Colon cancer Neg Hx     Social History   Tobacco Use  . Smoking status: Never Smoker  . Smokeless tobacco: Never Used  Substance Use Topics  . Alcohol use: No    Alcohol/week: 4.0 standard drinks    Types: 2 Cans of beer, 2 Shots of liquor per week  . Drug use: Yes    Frequency: 4.0 times per week    Types: "Crack" cocaine, Cocaine    Comment: Recent use of crack    Home Medications Prior to Admission medications   Medication Sig Start Date End Date Taking? Authorizing Provider  albuterol (VENTOLIN HFA) 108 (90 Base) MCG/ACT inhaler Inhale 1-2 puffs into the lungs every 6 (six) hours as needed for  wheezing or shortness of breath. 12/09/19  Yes Eugenie Filler, MD  allopurinol (ZYLOPRIM) 100 MG tablet Take 100 mg by mouth daily as needed (gout).  07/30/19  Yes [provider]  atorvastatin (LIPITOR) 40 MG tablet Take 1 tablet (40 mg total) by mouth daily at 6 PM. 12/09/19  Yes Eugenie Filler, MD  cephALEXin (KEFLEX) 500 MG capsule Take 1 capsule (500 mg total) by mouth every 12 (twelve) hours for 4 days. 12/09/19 12/13/19 Yes Eugenie Filler, MD  diltiazem (CARDIZEM CD) 120 MG 24 hr capsule Take 1 capsule (120 mg total) by mouth daily. For blood pressure 12/09/19  Yes Eugenie Filler, MD  famotidine (PEPCID) 40 MG tablet Take 1 tablet (40 mg total) by mouth daily. 12/09/19  Yes Eugenie Filler, MD  gabapentin (NEURONTIN) 400 MG capsule Take 1 capsule (400 mg total) by mouth 2 (two) times daily as needed (agitation/neuropathy). 12/09/19 01/08/20 Yes Eugenie Filler, MD  insulin glargine (LANTUS) 100 UNIT/ML Solostar Pen Inject 30 Units into the skin daily. 12/09/19  Yes  Eugenie Filler, MD  insulin lispro (HUMALOG KWIKPEN) 100 UNIT/ML KwikPen Inject 0.04 mLs (4 Units total) into the skin 3 (three) times daily with meals. 12/09/19  Yes Eugenie Filler, MD  nitroGLYCERIN (NITROSTAT) 0.4 MG SL tablet Place 1 tablet (0.4 mg total) under the tongue every 5 (five) minutes as needed for chest pain. 12/09/19  Yes Eugenie Filler, MD  ondansetron (ZOFRAN) 4 MG tablet Take 1 tablet (4 mg total) by mouth every 8 (eight) hours as needed for nausea or vomiting. 08/21/19  Yes Cardama, Grayce Sessions, MD  sertraline (ZOLOFT) 50 MG tablet Take 1 tablet (50 mg total) by mouth daily. For depression 12/09/19  Yes Eugenie Filler, MD  traMADol (ULTRAM) 50 MG tablet Take 1 tablet (50 mg total) by mouth every 6 (six) hours as needed for moderate pain. 12/09/19  Yes Eugenie Filler, MD  traZODone (DESYREL) 100 MG tablet Take 1 tablet (100 mg total) by mouth at bedtime as needed for sleep. 12/09/19  Yes Eugenie Filler, MD  Insulin Pen Needle 31G X 5 MM MISC 4 Units by Does not apply route in the morning, at noon, and at bedtime. 12/09/19   Eugenie Filler, MD    Allergies    Patient has no known allergies.  Review of Systems   Review of Systems  Cardiovascular: Positive for chest pain.  All other systems reviewed and are negative.   Physical Exam Updated Vital Signs BP 133/86   Pulse 79   Temp 98.1 F (36.7 C) (Oral)   Resp 11   SpO2 100%   Physical Exam Vitals and nursing note reviewed.  Constitutional:      General: He is not in acute distress.    Appearance: Normal appearance. He is well-developed.  HENT:     Head: Normocephalic and atraumatic.     Right Ear: Hearing normal.     Left Ear: Hearing normal.     Nose: Nose normal.  Eyes:     Conjunctiva/sclera: Conjunctivae normal.     Pupils: Pupils are equal, round, and reactive to light.  Cardiovascular:     Rate and Rhythm: Regular rhythm.     Heart sounds: S1 normal and S2 normal. No murmur. No  friction rub. No gallop.   Pulmonary:     Effort: Pulmonary effort is normal. No respiratory distress.     Breath sounds: Normal breath sounds.  Chest:  Chest wall: Tenderness present.    Abdominal:     General: Bowel sounds are normal.     Palpations: Abdomen is soft.     Tenderness: There is no abdominal tenderness. There is no guarding or rebound. Negative signs include Murphy's sign and McBurney's sign.     Hernia: No hernia is present.  Musculoskeletal:        General: Normal range of motion.     Cervical back: Normal range of motion and neck supple.  Skin:    General: Skin is warm and dry.     Findings: No rash.  Neurological:     Mental Status: He is alert and oriented to person, place, and time.     GCS: GCS eye subscore is 4. GCS verbal subscore is 5. GCS motor subscore is 6.     Cranial Nerves: No cranial nerve deficit.     Sensory: No sensory deficit.     Coordination: Coordination normal.  Psychiatric:        Speech: Speech normal.        Behavior: Behavior normal.        Thought Content: Thought content normal.     ED Results / Procedures / Treatments   Labs (all labs ordered are listed, but only abnormal results are displayed) Labs Reviewed  CBC WITH DIFFERENTIAL/PLATELET - Abnormal; Notable for the following components:      Result Value   Hemoglobin 11.4 (*)    HCT 37.5 (*)    MCH 25.6 (*)    All other components within normal limits  BASIC METABOLIC PANEL - Abnormal; Notable for the following components:   CO2 16 (*)    Glucose, Bld 248 (*)    BUN 37 (*)    Creatinine, Ser 2.39 (*)    GFR calc non Af Amer 27 (*)    GFR calc Af Amer 31 (*)    Anion gap 18 (*)    All other components within normal limits  RAPID URINE DRUG SCREEN, HOSP PERFORMED  TROPONIN I (HIGH SENSITIVITY)  TROPONIN I (HIGH SENSITIVITY)    EKG EKG Interpretation  Date/Time:  Sunday December 12 2019 01:34:53 EST Ventricular Rate:  81 PR Interval:    QRS Duration: 82 QT  Interval:  367 QTC Calculation: 426 R Axis:   69 Text Interpretation: Sinus arrhythmia Probable left atrial enlargement Probable anteroseptal infarct, old No significant change since last tracing Confirmed by Orpah Greek [phone]) on 12/12/2019 1:37:23 AM   Radiology DG Chest Port 1 View  Result Date: 12/12/2019 CLINICAL DATA:  Chest pain EXAM: PORTABLE CHEST 1 VIEW COMPARISON:  December 07, 2019 FINDINGS: The heart size and mediastinal contours are within normal limits. Both lungs are clear. The visualized skeletal structures are unremarkable. An atrial appendage clip is noted. The patient is status post prior median sternotomy. The patient is status post prior ACDF of the lower cervical spine. IMPRESSION: No active disease. Electronically Signed   By: Constance Holster M.D.   On: 12/12/2019 03:08    Procedures Procedures (including critical care time)  Medications Ordered in ED Medications - No data to display  ED Course  I have reviewed the triage vital signs and the nursing notes.  Pertinent labs & imaging results that were available during my care of the patient were reviewed by me and considered in my medical decision making (see chart for details).    MDM Rules/Calculators/A&P HEAR Score: 4  Patient presents with sharp chest pain that began an hour before coming to the ER.  Patient admits to cocaine use earlier tonight.  He has a long history of cocaine abuse.  Patient also, however, has a history of heart disease.  He has had previous bypass.  Pain is atypical, however.  It is sharp, does not radiate and chest wall is tender.  EKG does not show any ischemic changes.  Patient has had 2 - troponins.  Heart score is 4, current algorithm recommends either hospitalization or outpatient follow-up.  As his symptoms are atypical, patient will be discharged with return precautions.  Final Clinical Impression(s) / ED Diagnoses Final diagnoses:  Chest pain,  unspecified type  Cocaine abuse [facility])    Rx / DC Orders ED Discharge Orders    None       Oscar Hank, [name], MD 12/12/19 [phone]

## 2019-12-12 NOTE — ED Notes (Signed)
Myself and EMT Dylan R attempted blood draw x2 each Unsuccessful all 4 times Notified RN of same

## 2019-12-12 NOTE — ED Triage Notes (Signed)
Pt from hotel, arrives via [facility].    Pt endorses 9/10 sharp chest pain after use of crack cocaine.   Pt received 324 of aspirin with EMS along with 1 nitro with no relief.   Pt has hx of CABG.   A&ox4

## 2019-12-12 NOTE — ED Notes (Signed)
PT refused urine sample.

## 2019-12-12 NOTE — ED Provider Notes (Signed)
[facility] DEPT Provider Note   CSN: [id number] Arrival date & time: 12/12/19  2007     History Chief Complaint  Patient presents with  . Suicidal    Michael [name] is a 69 y.o. male.  He presents today with multiple complaints.  He said he is been feeling increasingly suicidal over the past few months.  He is try to hurt himself by cutting wrists and stopping (cars but does not have a plan today.  Increasingly depressed.  Also complaining of a few weeks of chest pain and a few days of shortness of breath.  Seen yesterday for chest pain and work-up was negative including 2 troponins chest x-ray.  Patient admitted to crack cocaine.  Denies overdose.  The history is provided by the patient.  Chest Pain Pain location:  Substernal area Pain quality: pressure   Pain radiates to:  Does not radiate Pain severity:  Moderate Onset quality:  Gradual Timing:  Intermittent Chronicity:  Recurrent Relieved by:  None tried Worsened by:  Nothing Ineffective treatments:  None tried Associated symptoms: cough and shortness of breath   Associated symptoms: no abdominal pain, no fever, no headache and no syncope   Risk factors: coronary artery disease, high cholesterol, hypertension and male sex   Mental Health Problem Presenting symptoms: depression and suicidal thoughts   Degree of incapacity (severity):  Unable to specify Onset quality:  Gradual Timing:  Intermittent Progression:  Worsening Chronicity:  Recurrent Context: drug abuse   Relieved by:  None tried Worsened by:  Nothing Ineffective treatments:  None tried Associated symptoms: chest pain   Associated symptoms: no abdominal pain and no headaches   Risk factors: hx of mental illness        Past Medical History:  Diagnosis Date  . A-fib ([place])   . Asthma    No PFTs, history of childhood asthma  . CAD (coronary artery disease)    a. 06/2013 STEMI/PCI ([facility]): LAD w/ thrombus (treated with  BMS), mid 75%, D2 75%; LCX OM2 75%; RCA small, PDA 95%, PLV 95%;  b. 10/2013 Cath/PCI: ISR w/in LAD (Promus DES x 2), borderline OM2 lesion;  c. 01/2014 MV: Intermediate risk, medium-sized distal ant wall infarct w/ very small amt of peri-infarct ischemia. EF 60%.  . Cellulitis 04/2014   left facial  . Cellulitis and abscess of toe of right foot 12/08/2019  . Chondromalacia of medial femoral condyle    Left knee MRI 04/28/12: Chondromalacia of the medial femoral condyle with slight peripheral degeneration of the meniscocapsular junction of the medial meniscus; followed by sports medicine  . Collagen vascular disease ([place])   . Crack cocaine use    for 20+ years, has been enrolled in detox programs in the past  . Depression    with history of hospitalization for suicidal ideation  . Diabetes mellitus 2002   Diagnosed in 2002, started insulin in 2012  . Gout   . Gout 04/28/2012  . Headache(784.0)    CT head 08/2011: Periventricular and subcortical white matter hypodensities are most in keeping with chronic microangiopathic change  . HIV infection [facility]) Nov 2012   Followed by [name]  . Hyperlipidemia   . Hypertension   . Pulmonary embolism [facility])     Patient Active Problem List   Diagnosis Date Noted  . Cellulitis 12/07/2019  . Osteomyelitis ([place]) 07/17/2019  . Abscess or cellulitis of toe, right   . Toe pain, right-second   . MDD (major depressive disorder),  recurrent episode, severe ([facility]) 06/15/2019  . Respiratory distress 04/12/2019  . Pneumonia due to severe acute respiratory syndrome coronavirus 2 (SARS-CoV-2) 04/11/2019  . COVID-19 virus infection 03/19/2019  . Chest pain 10/03/2018  . Malnutrition of moderate degree 09/21/2018  . MDD (major depressive disorder), recurrent episode, moderate ([place])   . Type II diabetes mellitus with renal manifestations ([place]) 05/04/2018  . GERD (gastroesophageal reflux disease) 05/04/2018  . S/P CABG (coronary artery bypass graft)   . Left  testicular pain   . Depression 02/20/2018  . Epididymo-orchitis, acute 02/20/2018  . Essential hypertension 02/20/2018  . Precordial chest pain 02/20/2018  . AKI (acute kidney injury) ([place]) 02/14/2018  . HCAP (healthcare-associated pneumonia) 02/14/2018  . History of pulmonary embolism 07/04/2017  . Acute hyponatremia 05/11/2017  . Hyperglycemia due to type 2 diabetes mellitus ([place]) 05/11/2017  . CHF (congestive heart failure) ([place]) 04/01/2017  . Hepatitis C 12/30/2016  . Chronic ischemic heart disease 11/12/2016  . Paroxysmal A-fib ([place]) 11/12/2016  . Back pain 04/18/2016  . S/P carotid endarterectomy 11/15/2015  . MDD (major depressive disorder), recurrent severe, without psychosis ([place]) 09/09/2015  . Cocaine-induced mood disorder ([place]) 08/14/2015  . Cocaine abuse with cocaine-induced mood disorder ([place]) 08/14/2015  . Gout 07/10/2015  . Acute renal failure superimposed on stage 3 chronic kidney disease ([place]) 03/06/2015  . Anemia 03/06/2015  . Chronic kidney disease, stage III (moderate) 03/06/2015  . Hypoglycemia   . Encounter for general adult medical examination with abnormal findings 02/09/2015  . Cocaine use disorder, severe, dependence ([place]) 12/13/2014  . Substance or medication-induced depressive disorder with onset during withdrawal ([place]) 12/13/2014  . Severe recurrent major depressive disorder with psychotic features ([place]) 12/12/2014  . Cervicalgia 06/28/2014  . Lumbar radiculopathy, chronic 06/28/2014  . Asthma, chronic 02/03/2014  . 3-vessel CAD 06/24/2013  . ED (erectile dysfunction) of organic origin 07/07/2012  . Hypertension goal BP (blood pressure) < 140/80 04/29/2012  . Chondromalacia of left knee 03/19/2012  . Hyperlipidemia with target LDL less than 100 02/12/2012  . Fibromyalgia 02/12/2012  . HIV (human immunodeficiency virus infection) ([place]) 08/27/2011  . Uncontrolled type 2 diabetes with neuropathy ([place]) 10/17/2000    Past Surgical History:  Procedure  Laterality Date  . AMPUTATION Right 07/21/2019   Procedure: RIGHT SECOND TOE AMPUTATION;  Surgeon: [name], MD;  Location: [facility];  Service: Orthopedics;  Laterality: Right;  . BACK SURGERY     1988  . BOWEL RESECTION    . CARDIAC SURGERY    . CERVICAL SPINE SURGERY     " rods in my neck "  . CORONARY ARTERY BYPASS GRAFT    . CORONARY STENT PLACEMENT    . NM MYOCAR PERF WALL MOTION  12/27/2011   normal  . SPINE SURGERY         Family History  Problem Relation Age of Onset  . Diabetes Mother   . Hypertension Mother   . Hyperlipidemia Mother   . Diabetes Father   . Cancer Father   . Hypertension Father   . Diabetes Brother   . Heart disease Brother   . Diabetes Sister   . Colon cancer Neg Hx     Social History   Tobacco Use  . Smoking status: Never Smoker  . Smokeless tobacco: Never Used  Substance Use Topics  . Alcohol use: No    Alcohol/week: 4.0 standard drinks    Types: 2 Cans of beer, 2 Shots of liquor per week  . Drug use: Yes  Frequency: 4.0 times per week    Types: "Crack" cocaine, Cocaine    Comment: Recent use of crack    Home Medications Prior to Admission medications   Medication Sig Start Date End Date Taking? Authorizing Provider  albuterol (VENTOLIN HFA) 108 (90 Base) MCG/ACT inhaler Inhale 1-2 puffs into the lungs every 6 (six) hours as needed for wheezing or shortness of breath. 12/09/19   Eugenie Filler, MD  allopurinol (ZYLOPRIM) 100 MG tablet Take 100 mg by mouth daily as needed (gout).  07/30/19   [provider]  atorvastatin (LIPITOR) 40 MG tablet Take 1 tablet (40 mg total) by mouth daily at 6 PM. 12/09/19   Eugenie Filler, MD  cephALEXin (KEFLEX) 500 MG capsule Take 1 capsule (500 mg total) by mouth every 12 (twelve) hours for 4 days. 12/09/19 12/13/19  Eugenie Filler, MD  diltiazem (CARDIZEM CD) 120 MG 24 hr capsule Take 1 capsule (120 mg total) by mouth daily. For blood pressure 12/09/19   Eugenie Filler, MD   famotidine (PEPCID) 40 MG tablet Take 1 tablet (40 mg total) by mouth daily. 12/09/19   Eugenie Filler, MD  gabapentin (NEURONTIN) 400 MG capsule Take 1 capsule (400 mg total) by mouth 2 (two) times daily as needed (agitation/neuropathy). 12/09/19 01/08/20  Eugenie Filler, MD  insulin glargine (LANTUS) 100 UNIT/ML Solostar Pen Inject 30 Units into the skin daily. 12/09/19   Eugenie Filler, MD  insulin lispro (HUMALOG KWIKPEN) 100 UNIT/ML KwikPen Inject 0.04 mLs (4 Units total) into the skin 3 (three) times daily with meals. 12/09/19   Eugenie Filler, MD  Insulin Pen Needle 31G X 5 MM MISC 4 Units by Does not apply route in the morning, at noon, and at bedtime. 12/09/19   Eugenie Filler, MD  nitroGLYCERIN (NITROSTAT) 0.4 MG SL tablet Place 1 tablet (0.4 mg total) under the tongue every 5 (five) minutes as needed for chest pain. 12/09/19   Eugenie Filler, MD  ondansetron (ZOFRAN) 4 MG tablet Take 1 tablet (4 mg total) by mouth every 8 (eight) hours as needed for nausea or vomiting. 08/21/19   Fatima Blank, MD  sertraline (ZOLOFT) 50 MG tablet Take 1 tablet (50 mg total) by mouth daily. For depression 12/09/19   Eugenie Filler, MD  traMADol (ULTRAM) 50 MG tablet Take 1 tablet (50 mg total) by mouth every 6 (six) hours as needed for moderate pain. 12/09/19   Eugenie Filler, MD  traZODone (DESYREL) 100 MG tablet Take 1 tablet (100 mg total) by mouth at bedtime as needed for sleep. 12/09/19   Eugenie Filler, MD    Allergies    Patient has no known allergies.  Review of Systems   Review of Systems  Constitutional: Negative for fever.  HENT: Negative for sore throat.   Eyes: Negative for visual disturbance.  Respiratory: Positive for cough and shortness of breath.   Cardiovascular: Positive for chest pain. Negative for syncope.  Gastrointestinal: Negative for abdominal pain.  Genitourinary: Negative for dysuria.  Musculoskeletal: Negative for neck pain.  Skin: Negative  for rash.  Neurological: Negative for headaches.  Psychiatric/Behavioral: Positive for suicidal ideas.    Physical Exam Updated Vital Signs BP (!) 158/90 (BP Location: Left Arm)   Pulse 90   Temp (!) 97.4 F (36.3 C) (Oral)   Resp 17   SpO2 100%   Physical Exam Vitals and nursing note reviewed.  Constitutional:      Appearance:  He is well-developed.  HENT:     Head: Normocephalic and atraumatic.  Eyes:     Conjunctiva/sclera: Conjunctivae normal.  Cardiovascular:     Rate and Rhythm: Normal rate and regular rhythm.     Heart sounds: No murmur.  Pulmonary:     Effort: Pulmonary effort is normal. No respiratory distress.     Breath sounds: Normal breath sounds.  Abdominal:     Palpations: Abdomen is soft.     Tenderness: There is no abdominal tenderness.  Musculoskeletal:        General: Normal range of motion.     Cervical back: Neck supple.  Skin:    General: Skin is warm and dry.     Capillary Refill: Capillary refill takes less than 2 seconds.  Neurological:     General: No focal deficit present.     Mental Status: He is alert.     ED Results / Procedures / Treatments   Labs (all labs ordered are listed, but only abnormal results are displayed) Labs Reviewed  COMPREHENSIVE METABOLIC PANEL - Abnormal; Notable for the following components:      Result Value   Sodium 132 (*)    CO2 21 (*)    Glucose, Bld 356 (*)    BUN 48 (*)    Creatinine, Ser 2.24 (*)    Calcium 8.5 (*)    GFR calc non Af Amer 29 (*)    GFR calc Af Amer 33 (*)    All other components within normal limits  SALICYLATE LEVEL - Abnormal; Notable for the following components:   Salicylate Lvl <0.9 (*)    All other components within normal limits  ACETAMINOPHEN LEVEL - Abnormal; Notable for the following components:   Acetaminophen (Tylenol), Serum <10 (*)    All other components within normal limits  RAPID URINE DRUG SCREEN, HOSP PERFORMED - Abnormal; Notable for the following components:    Cocaine POSITIVE (*)    All other components within normal limits  CBC - Abnormal; Notable for the following components:   RBC 3.92 (*)    Hemoglobin 10.1 (*)    HCT 31.6 (*)    MCH 25.8 (*)    All other components within normal limits  CBG MONITORING, ED - Abnormal; Notable for the following components:   Glucose-Capillary 263 (*)    All other components within normal limits  RESPIRATORY PANEL BY RT PCR (FLU A&B, COVID)  ETHANOL  TROPONIN I (HIGH SENSITIVITY)  TROPONIN I (HIGH SENSITIVITY)    EKG EKG Interpretation  Date/Time:  Monday December 13 2019 00:02:25 EST Ventricular Rate:  88 PR Interval:  144 QRS Duration: 95 QT Interval:  370 QTC Calculation: 448 R Axis:   75 Text Interpretation: Sinus arrhythmia Anteroseptal infarct, old No significant change since last tracing Confirmed by Orpah Greek [phone]) on 12/13/2019 12:08:28 AM   Radiology DG Chest Port 1 View  Result Date: 12/12/2019 CLINICAL DATA:  Chest pain EXAM: PORTABLE CHEST 1 VIEW COMPARISON:  December 07, 2019 FINDINGS: The heart size and mediastinal contours are within normal limits. Both lungs are clear. The visualized skeletal structures are unremarkable. An atrial appendage clip is noted. The patient is status post prior median sternotomy. The patient is status post prior ACDF of the lower cervical spine. IMPRESSION: No active disease. Electronically Signed   By: Constance Holster M.D.   On: 12/12/2019 03:08    Procedures Procedures (including critical care time)  Medications Ordered in ED Medications  acetaminophen (TYLENOL)  tablet 650 mg (has no administration in time range)  alum & mag hydroxide-simeth (MAALOX/MYLANTA) 200-200-20 MG/5ML suspension 30 mL (has no administration in time range)  atorvastatin (LIPITOR) tablet 40 mg (has no administration in time range)  diltiazem (CARDIZEM CD) 24 hr capsule 120 mg (120 mg Oral Given 12/13/19 0951)  famotidine (PEPCID) tablet 40 mg (40 mg Oral Given  12/13/19 0951)  gabapentin (NEURONTIN) capsule 400 mg (has no administration in time range)  insulin glargine (LANTUS) injection 30 Units (has no administration in time range)  insulin aspart (novoLOG) injection 4 Units (4 Units Subcutaneous Given 12/13/19 0825)  sertraline (ZOLOFT) tablet 50 mg (50 mg Oral Given 12/13/19 0951)  traZODone (DESYREL) tablet 100 mg (has no administration in time range)    ED Course  I have reviewed the triage vital signs and the nursing notes.  Pertinent labs & imaging results that were available during my care of the patient were reviewed by me and considered in my medical decision making (see chart for details).  Clinical Course as of Dec 13 951  Sun Dec 12, 2019  2319 Patient's labs showing a similar anemia to prior.  His renal function is a little worse than his baseline.  Initial troponin 8   [MB]  2331 Patient will be signed out to oncoming physician with plan to follow-up on the repeat EKG and clearance the patient for psychiatric evaluation.   [MB]    Clinical Course User Index [MB] Hayden Rasmussen, MD   MDM Rules/Calculators/A&P                     ddx - acs, vasospasm, musculoskeletal, gerd, stress, substance abuse  Final Clinical Impression(s) / ED Diagnoses Final diagnoses:  Nonspecific chest pain  Polysubstance abuse ([place])  Suicidal thoughts    Rx / DC Orders ED Discharge Orders    None       Hayden Rasmussen, MD 12/13/19 [phone]

## 2019-12-12 NOTE — ED Notes (Signed)
Unsuccessful lab attempt x 3 RN aware. Blood return x1 not enough to send for lab.

## 2019-12-12 NOTE — ED Triage Notes (Signed)
Patient arrived stating that he has a history with mental health and has been feeling suicidal over the last two months. States he has tried to hurt himself in the past by cutting his wrist and stepping out in front of cars but does not have a specific plan today. States last drug use three days ago.

## 2019-12-12 NOTE — ED Notes (Signed)
I attempted to call phlebotomy to draw labs on this patient. There was no answer. I will call back in a little while.

## 2019-12-12 NOTE — ED Notes (Signed)
Patient verbalizes understanding of discharge instructions . Opportunity for questions and answers were provided . Armband removed by staff ,Pt discharged from ED. W/C  offered at D/C  and Declined W/C at D/C and was escorted to lobby by RN.  

## 2019-12-13 DIAGNOSIS — Z5900 Homelessness unspecified: Secondary | ICD-10-CM

## 2019-12-13 LAB — RAPID URINE DRUG SCREEN, HOSP PERFORMED
Amphetamines: NOT DETECTED
Barbiturates: NOT DETECTED
Benzodiazepines: NOT DETECTED
Cocaine: POSITIVE — AB
Opiates: NOT DETECTED
Tetrahydrocannabinol: NOT DETECTED

## 2019-12-13 LAB — RESPIRATORY PANEL BY RT PCR (FLU A&B, COVID)
Influenza A by PCR: NEGATIVE
Influenza B by PCR: NEGATIVE
SARS Coronavirus 2 by RT PCR: NEGATIVE

## 2019-12-13 LAB — CBG MONITORING, ED: Glucose-Capillary: 263 mg/dL — ABNORMAL HIGH (ref 70–99)

## 2019-12-13 NOTE — ED Notes (Signed)
TTS cart at bedside. 

## 2019-12-13 NOTE — ED Provider Notes (Signed)
Emergency Medicine Observation Re-evaluation Note  Michael [name] is a 69 y.o. male, seen on rounds today.  Pt initially presented to the ED for complaints of Suicidal Currently, the patient is stable  Physical Exam  BP (!) 146/100   Pulse 78   Temp (!) 97.4 F (36.3 C) (Oral)   Resp 13   SpO2 97%  Physical Exam Alert in nad ED Course / MDM  EKG:EKG Interpretation  Date/Time:  Monday December 13 2019 00:02:25 EST Ventricular Rate:  88 PR Interval:  144 QRS Duration: 95 QT Interval:  370 QTC Calculation: 448 R Axis:   75 Text Interpretation: Sinus arrhythmia Anteroseptal infarct, old No significant change since last tracing Confirmed by Orpah Greek [phone]) on 12/13/2019 12:08:28 AM  Clinical Course as of Dec 13 950  Sun Dec 12, 2019  2319 Patient's labs showing a similar anemia to prior.  His renal function is a little worse than his baseline.  Initial troponin 8   [MB]  2331 Patient will be signed out to oncoming physician with plan to follow-up on the repeat EKG and clearance the patient for psychiatric evaluation.   [MB]    Clinical Course User Index [MB] Hayden Rasmussen, MD    Plan  placement   Milton Ferguson, MD 12/13/19 [phone]

## 2019-12-13 NOTE — BH Assessment (Signed)
[facility] Assessment Progress Note  Per Hampton Abbot, MD, this pt does not require psychiatric hospitalization at this time.  Pt is to be discharged from [facility] with referral information for area substance abuse treatment providers. This has been included in pt's discharge instructions.  Pt would also benefit from seeing Peer Support Specialists, and a peer support consult has been ordered for pt.  Pt's nurse has been notified.  Jalene Mullet, [place] Triage Specialist [phone]

## 2019-12-13 NOTE — Progress Notes (Signed)
Michael Romp, NP recommends inpt tx. TTS to seek placement due to no appropriate beds at [facility] per [facility]. Fanny Bien, RN has been advised.

## 2019-12-13 NOTE — ED Notes (Signed)
Pt was discharged waiting for ? Transport to [place] with Peer support was working on plan. They are not willing to accept pt today. Pt is aware and given bus pass at his request to leave facility.

## 2019-12-13 NOTE — BH Assessment (Signed)
Tele Assessment Note   Patient Name: Michael [name] MRN: [id number] Referring Physician: Hayden Rasmussen, MD Location of Patient: [facility] Location of Provider: [place] Department  Michael [name] is an 69 y.o. male who presents to the ED voluntarily. Pt reports he has been increasingly depressed and suicidal. Pt states he thinks about suicide everyday. Pt reports today he tried to walk into traffic, however the car slammed on brakes at the last minute. Pt reports he is suicidal because he is homeless and lives in a motel. Pt states he was renting a room from someone but they had bed bugs so he had to move out. Pt reports his wife died 3 years ago and he thinks about her everyday. Pt states he has no supports or collaterals and he feels alone. Pt states he has attempted suicide in the past by cutting his wrists, however the knife was dull. Pt has been admitted to multiple inpt facilities in the past. Pt denies HI but reports he sometimes gets angry and thinks about hitting people just out of anger. Pt endorses VH that started about 2 months ago by seeing things on the wall. Pt states he uses cocaine weekly and has been sober for "just a few months" before he began using cocaine again.   Michael Romp, NP recommends inpt tx. TTS to seek placement due to no appropriate beds at [facility] per [facility]. Michael Bien, RN has been advised.  Diagnosis: MDD, recurrent, severe, w/ psychosis; Cocaine use d/o, severe  Past Medical History:  Past Medical History:  Diagnosis Date  . A-fib ([place])   . Asthma    No PFTs, history of childhood asthma  . CAD (coronary artery disease)    a. 06/2013 STEMI/PCI ([facility]): LAD w/ thrombus (treated with BMS), mid 75%, D2 75%; LCX OM2 75%; RCA small, PDA 95%, PLV 95%;  b. 10/2013 Cath/PCI: ISR w/in LAD (Promus DES x 2), borderline OM2 lesion;  c. 01/2014 MV: Intermediate risk, medium-sized distal ant wall infarct w/ very small amt of peri-infarct ischemia. EF 60%.   . Cellulitis 04/2014   left facial  . Cellulitis and abscess of toe of right foot 12/08/2019  . Chondromalacia of medial femoral condyle    Left knee MRI 04/28/12: Chondromalacia of the medial femoral condyle with slight peripheral degeneration of the meniscocapsular junction of the medial meniscus; followed by sports medicine  . Collagen vascular disease ([place])   . Crack cocaine use    for 20+ years, has been enrolled in detox programs in the past  . Depression    with history of hospitalization for suicidal ideation  . Diabetes mellitus 2002   Diagnosed in 2002, started insulin in 2012  . Gout   . Gout 04/28/2012  . Headache(784.0)    CT head 08/2011: Periventricular and subcortical white matter hypodensities are most in keeping with chronic microangiopathic change  . HIV infection [facility]) Nov 2012   Followed by [name]  . Hyperlipidemia   . Hypertension   . Pulmonary embolism [facility])     Past Surgical History:  Procedure Laterality Date  . AMPUTATION Right 07/21/2019   Procedure: RIGHT SECOND TOE AMPUTATION;  Surgeon: [name], MD;  Location: [facility];  Service: Orthopedics;  Laterality: Right;  . BACK SURGERY     1988  . BOWEL RESECTION    . CARDIAC SURGERY    . CERVICAL SPINE SURGERY     " rods in my neck "  . CORONARY  ARTERY BYPASS GRAFT    . CORONARY STENT PLACEMENT    . NM MYOCAR PERF WALL MOTION  12/27/2011   normal  . SPINE SURGERY      Family History:  Family History  Problem Relation Age of Onset  . Diabetes Mother   . Hypertension Mother   . Hyperlipidemia Mother   . Diabetes Father   . Cancer Father   . Hypertension Father   . Diabetes Brother   . Heart disease Brother   . Diabetes Sister   . Colon cancer Neg Hx     Social History:  reports that he has never smoked. He has never used smokeless tobacco. He reports current drug use. Frequency: 4.00 times per week. Drugs: "Crack" cocaine and Cocaine. He reports that he does not drink  alcohol.  Additional Social History:  Alcohol / Drug Use Pain Medications: See MAR Prescriptions: See MAR Over the Counter: See MAR History of alcohol / drug use?: Yes Substance #1 Name of Substance 1: Cocaine 1 - Age of First Use: 41 1 - Amount (size/oz): varies 1 - Frequency: several times a week 1 - Duration: ongoing 1 - Last Use / Amount: 3 days ago  CIWA: CIWA-Ar BP: (!) 168/94 Pulse Rate: 86 COWS:    Allergies: No Known Allergies  Home Medications: (Not in a hospital admission)   OB/GYN Status:  No LMP for male patient.  General Assessment Data Location of Assessment: [facility] TTS Assessment: In system Is this a Tele or Face-to-Face Assessment?: Tele Assessment Is this an Initial Assessment or a Re-assessment for this encounter?: Initial Assessment Patient Accompanied by:: N/A Language Other than English: No Living Arrangements: Homeless/Shelter What gender do you identify as?: Male Marital status: Widowed Pregnancy Status: No Living Arrangements: Alone Can pt return to current living arrangement?: Yes Admission Status: Voluntary Is patient capable of signing voluntary admission?: Yes Referral Source: Self/Family/Friend Insurance type: [facility]     Crisis Care Plan Living Arrangements: Alone Name of Psychiatrist: None Name of Therapist: none  Education Status Is patient currently in school?: No Is the patient employed, unemployed or receiving disability?: Receiving disability income  Risk to self with the past 6 months Suicidal Ideation: Yes-Currently Present Has patient been a risk to self within the past 6 months prior to admission? : Yes Suicidal Intent: Yes-Currently Present Has patient had any suicidal intent within the past 6 months prior to admission? : Yes Is patient at risk for suicide?: Yes Suicidal Plan?: Yes-Currently Present Has patient had any suicidal plan within the past 6 months prior to admission? : Yes Specify Current Suicidal Plan:  pt tried to walk in front of a car  Access to Means: Yes Specify Access to Suicidal Means: pt has access to a car What has been your use of drugs/alcohol within the last 12 months?: cocaine Previous Attempts/Gestures: Yes How many times?: 1 Other Self Harm Risks: hx of depression, hx of suicide attempt Triggers for Past Attempts: Other personal contacts Intentional Self Injurious Behavior: None Family Suicide History: No Recent stressful life event(s): Loss (Comment), Other (Comment)(living in motel, SA, wife died 3 years ago) Persecutory voices/beliefs?: No Depression: Yes Depression Symptoms: Despondent, Tearfulness, Guilt, Loss of interest in usual pleasures, Feeling worthless/self pity, Insomnia Substance abuse history and/or treatment for substance abuse?: Yes Suicide prevention information given to non-admitted patients: Not applicable  Risk to Others within the past 6 months Homicidal Ideation: No Does patient have any lifetime risk of violence toward others beyond the  six months prior to admission? : Yes (comment)(pt states he gets angry at others and wants to hit them) Thoughts of Harm to Others: No Current Homicidal Intent: No Current Homicidal Plan: No Access to Homicidal Means: No History of harm to others?: No Assessment of Violence: None Noted Does patient have access to weapons?: No Criminal Charges Pending?: No Does patient have a court date: No Is patient on probation?: No  Psychosis Hallucinations: Visual(sees things on the wall) Delusions: None noted  Mental Status Report Appearance/Hygiene: Unremarkable Eye Contact: Good Motor Activity: Freedom of movement Speech: Logical/coherent Level of Consciousness: Alert Mood: Depressed, Anxious, Despair, Sad Affect: Anxious, Depressed, Sad, Flat Anxiety Level: Moderate Thought Processes: Coherent, Relevant Judgement: Impaired Orientation: Person, Place, Time, Situation, Appropriate for developmental  age Obsessive Compulsive Thoughts/Behaviors: None  Cognitive Functioning Concentration: Normal Memory: Remote Intact, Recent Intact Is patient IDD: No Insight: Poor Impulse Control: Poor Appetite: Poor Have you had any weight changes? : Loss Amount of the weight change? (lbs): 10 lbs Sleep: Decreased Total Hours of Sleep: 2 Vegetative Symptoms: None  ADLScreening [facility] Assessment Services) Patient's cognitive ability adequate to safely complete daily activities?: Yes Patient able to express need for assistance with ADLs?: Yes Independently performs ADLs?: Yes (appropriate for developmental age)  Prior Inpatient Therapy Prior Inpatient Therapy: Yes Prior Therapy Dates: 2020, 2019, 2018 Prior Therapy Facilty/Provider(s): [facility] Reason for Treatment: SI, COCAINE  Prior Outpatient Therapy Prior Outpatient Therapy: No Does patient have an ACCT team?: No Does patient have Intensive In-House Services?  : No Does patient have [facility] services? : No Does patient have P4CC services?: No  ADL Screening (condition at time of admission) Patient's cognitive ability adequate to safely complete daily activities?: Yes Is the patient deaf or have difficulty hearing?: No Does the patient have difficulty seeing, even when wearing glasses/contacts?: No Does the patient have difficulty concentrating, remembering, or making decisions?: No Patient able to express need for assistance with ADLs?: Yes Does the patient have difficulty dressing or bathing?: No Independently performs ADLs?: Yes (appropriate for developmental age) Does the patient have difficulty walking or climbing stairs?: No Weakness of Legs: Both Weakness of Arms/Hands: None  Home Assistive Devices/Equipment Home Assistive Devices/Equipment: Cane (specify quad or straight)    Abuse/Neglect Assessment (Assessment to be complete while patient is alone) Abuse/Neglect Assessment Can Be Completed: Yes Physical Abuse: Denies Verbal  Abuse: Denies Sexual Abuse: Denies Exploitation of patient/patient's resources: Denies Self-Neglect: Denies     Regulatory affairs officer (For Healthcare) Does Patient Have a Medical Advance Directive?: No Would patient like information on creating a medical advance directive?: No - Patient declined          Disposition:  Michael Romp, NP recommends inpt tx. TTS to seek placement due to no appropriate beds at [facility] per [facility]. Michael Bien, RN has been advised. Disposition Initial Assessment Completed for this Encounter: Yes Disposition of Patient: Admit Type of inpatient treatment program: Adult Patient refused recommended treatment: No  This service was provided via telemedicine using a 2-way, interactive audio and video technology.  Names of all persons participating in this telemedicine service and their role in this encounter. Name: Michael [name] Role: Patient  Name: Lind Covert Role: TTS  Name: Michael Romp, NP Role: [facility] Provider       Lyanne Co 12/13/2019 1:00 AM

## 2019-12-13 NOTE — ED Notes (Signed)
Michael [name], peer support, currently working to see if patient can go to [facility].

## 2019-12-13 NOTE — ED Notes (Signed)
Sitter present at bedside.

## 2019-12-13 NOTE — Discharge Instructions (Signed)
To help you maintain a sober lifestyle, a substance abuse treatment program may be beneficial to you.  Contact one of the following facilities at your earliest opportunity to ask about enrolling:  RESIDENTIAL PROGRAMS:       [place]      [place], [place] [address]      [phone]       [facility]      [address] [place], [place] [address]      [phone]       [place]      [address] [place], [place] [address]      [phone]       [facility]      [place], [place] [address]      [phone]  OUTPATIENT PROGRAMS:       [facility] [place], [place] [address]      [phone]      New patients are seen at their walk-in clinic.  Walk-in hours are Monday - Friday from 8:30 am - 12:00 pm, and from 1:00 pm - 2:30 pm.  Walk-in patients are seen on a first come, first served basis, so try to arrive as early as possible for the best chance of being seen the same day.

## 2019-12-13 NOTE — BHH Suicide Risk Assessment (Cosign Needed)
Suicide Risk Assessment  Discharge Assessment   [facility] Discharge Suicide Risk Assessment   Principal Problem: Cocaine abuse with cocaine-induced mood disorder ([place]) Discharge Diagnoses: Principal Problem:   Cocaine abuse with cocaine-induced mood disorder ([place]) Active Problems:   Homelessness   Total Time spent with patient: 30 minutes  Musculoskeletal: Strength & Muscle Tone: within normal limits Gait & Station: normal Patient leans: N/A  Psychiatric Specialty Exam:   Blood pressure (!) 120/58, pulse 78, temperature (!) 97.4 F (36.3 C), temperature source Oral, resp. rate 13, SpO2 97 %.There is no height or weight on file to calculate BMI.  General Appearance: Casual  Eye Contact::  Good  Speech:  Clear and Coherent and Normal Rate409  Volume:  Normal  Mood:  Depressed  Stable  Affect:  Appropriate and Congruent  Thought Process:  Coherent, Goal Directed and Descriptions of Associations: Intact  Orientation:  Full (Time, Place, and Person)  Thought Content:  WDL and Logical  Suicidal Thoughts:  No  Homicidal Thoughts:  No  Memory:  Immediate;   Good Recent;   Good  Judgement:  Intact  Insight:  Present  Psychomotor Activity:  Normal  Concentration:  Good  Recall:  Good  Fund of Knowledge:Fair  Language: Good  Akathisia:  No  Handed:  Right  AIMS (if indicated):     Assets:  Communication Skills Desire for Improvement  Sleep:     Cognition: WNL  ADL's:  Intact   Mental Status Per Nursing Assessment::   On Admission:    Michael [name], 69 y.o., male patient seen via tele psych by this provider, Dr. Dwyane Dee; and chart reviewed on 12/13/19.  On evaluation [name] reports he is currently homeless with no where to go.  States he was staying with a friend who had bed bugs prior to going to motel to stay.  States that he uses crack cocaine and his biggest stressor is being homeless and drug use.  Patient states that he is interested in a halfway house that  can help with staying drug free. During evaluation Michael [name] is alert/oriented x 4; calm/cooperative; and mood is congruent with affect.  He does not appear to be responding to internal/external stimuli or delusional thoughts.  Patient denies suicidal/self-harm/homicidal ideation, psychosis, and paranoia.  Patient answered question appropriately.  Peer support order to assist with resources for halfway housing and referral to [facility]   Demographic Factors:  Male, Age 70 or older and Low socioeconomic status  Loss Factors: housing  Historical Factors: NA  Risk Reduction Factors:   Religious beliefs about death  Continued Clinical Symptoms:  Alcohol/Substance Abuse/Dependencies  Cognitive Features That Contribute To Risk:  None    Suicide Risk:  Minimal: No identifiable suicidal ideation.  Patients presenting with no risk factors but with morbid ruminations; may be classified as minimal risk based on the severity of the depressive symptoms    Plan Of Care/Follow-up recommendations:  Activity:  As tolerated Diet:  Heart healthy     Discharge Instructions     To help you maintain a sober lifestyle, a substance abuse treatment program may be beneficial to you.  Contact one of the following facilities at your earliest opportunity to ask about enrolling:  RESIDENTIAL PROGRAMS:       [place]      [place], [place] [address]      [phone]       [place]  [place], [place] [address]      [phone]       [place]      [address] [place], [place] [address]      [phone]       [facility]      [place], [place] [address]      [phone]  OUTPATIENT PROGRAMS:       [facility] [place], [place] [address]      [phone]      New patients are seen at their walk-in  clinic.  Walk-in hours are Monday - Friday from 8:30 am - 12:00 pm, and from 1:00 pm - 2:30 pm.  Walk-in patients are seen on a first come, first served basis, so try to arrive as early as possible for the best chance of being seen the same day.    Disposition:  Patient psychiatrically cleared No evidence of imminent risk to self or others at present.   Patient does not meet criteria for psychiatric inpatient admission. Supportive therapy provided about ongoing stressors. Refer to IOP. Discussed crisis plan, support from social network, calling 911, coming to the Emergency Department, and calling Suicide Hotline.  Sidhant Helderman, NP 12/13/2019, 11:16 AM

## 2019-12-14 ENCOUNTER — Ambulatory Visit: Payer: Medicare Other | Admitting: Orthopedic Surgery

## 2019-12-17 DIAGNOSIS — E11621 Type 2 diabetes mellitus with foot ulcer: Secondary | ICD-10-CM | POA: Insufficient documentation

## 2019-12-17 DIAGNOSIS — L97519 Non-pressure chronic ulcer of other part of right foot with unspecified severity: Secondary | ICD-10-CM | POA: Insufficient documentation

## 2019-12-17 DIAGNOSIS — E538 Deficiency of other specified B group vitamins: Secondary | ICD-10-CM | POA: Insufficient documentation

## 2019-12-28 ENCOUNTER — Inpatient Hospital Stay (INDEPENDENT_AMBULATORY_CARE_PROVIDER_SITE_OTHER): Payer: Medicare Other | Admitting: Primary Care

## 2020-01-25 IMAGING — DX PORTABLE CHEST - 1 VIEW
1 series · 1 of 1 positions shown · non-contrast
Comparison: 10/11/2018

CLINICAL DATA: Chest pain and shortness of breath

EXAM:
PORTABLE CHEST 1 VIEW

[chest]
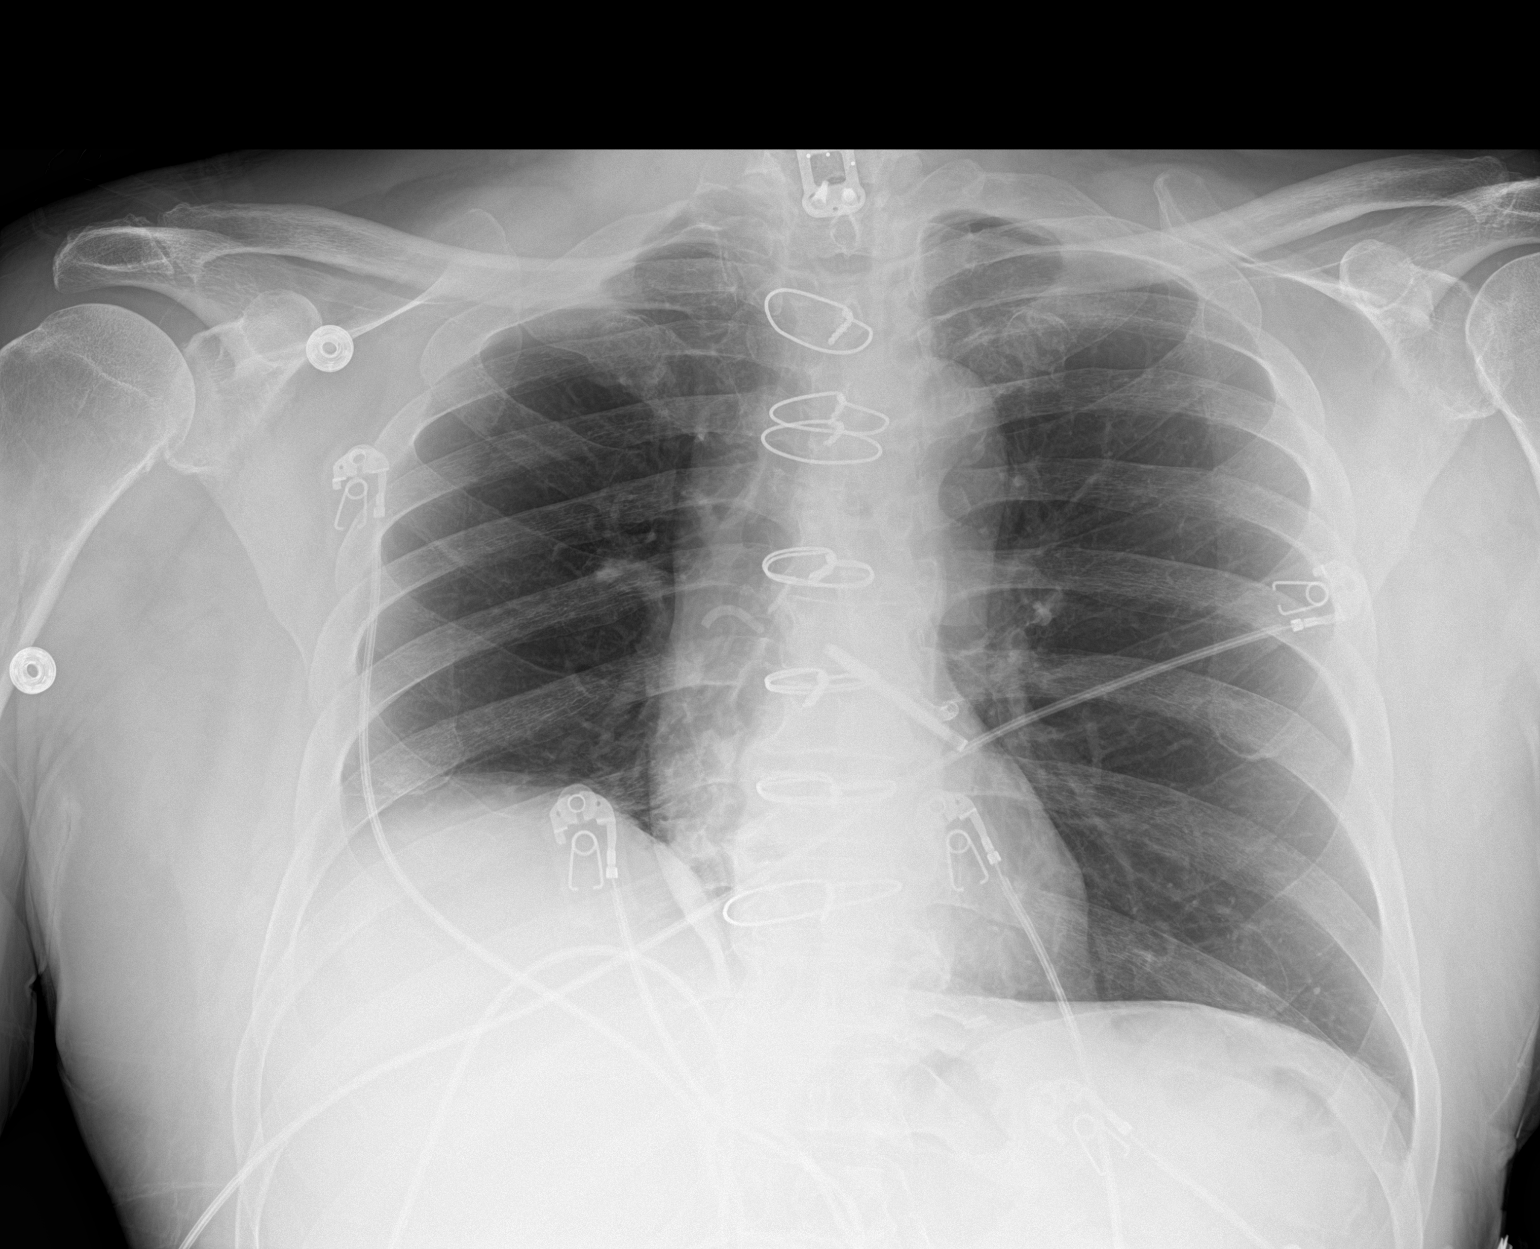

[1 of 1 positions shown; findings below may reference images not displayed]

FINDINGS: Prior median sternotomy and left atrial clipping. Coronary stenting.
Stable elevation of the right diaphragm. Improved atelectasis. No
effusion or pneumothorax.
IMPRESSION: 1. Improving aeration.
2. Chronically elevated right diaphragm.

## 2020-02-17 DIAGNOSIS — M1A09X0 Idiopathic chronic gout, multiple sites, without tophus (tophi): Secondary | ICD-10-CM | POA: Insufficient documentation

## 2020-02-19 IMAGING — CR CHEST - 2 VIEW
2 series · 2 of 2 positions shown · non-contrast
Comparison: 12/16/2018; 10/11/2018; 10/02/2018; chest CT-10/02/2018

CLINICAL DATA: Chest pain

EXAM:
CHEST - 2 VIEW

[chest pa]
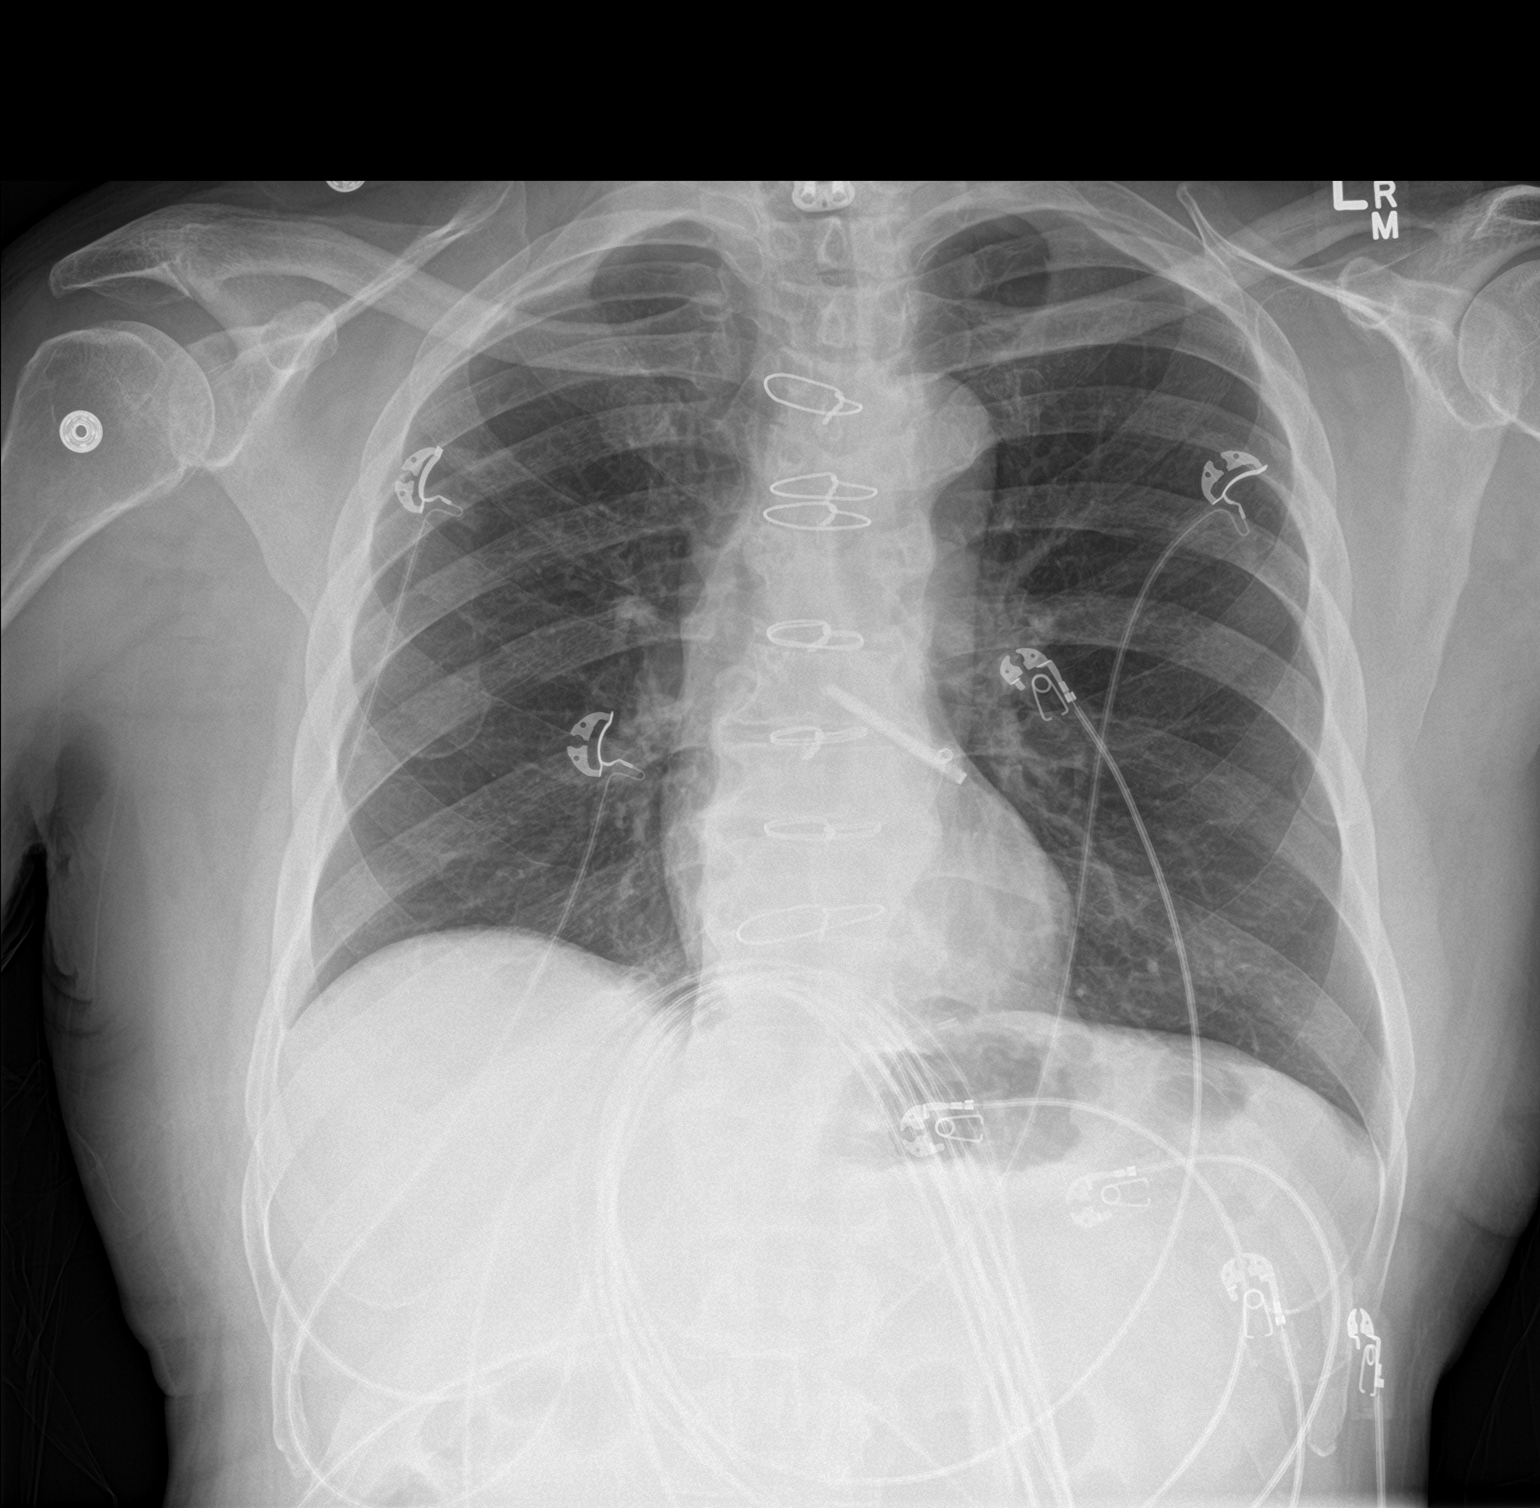

[chest lat]
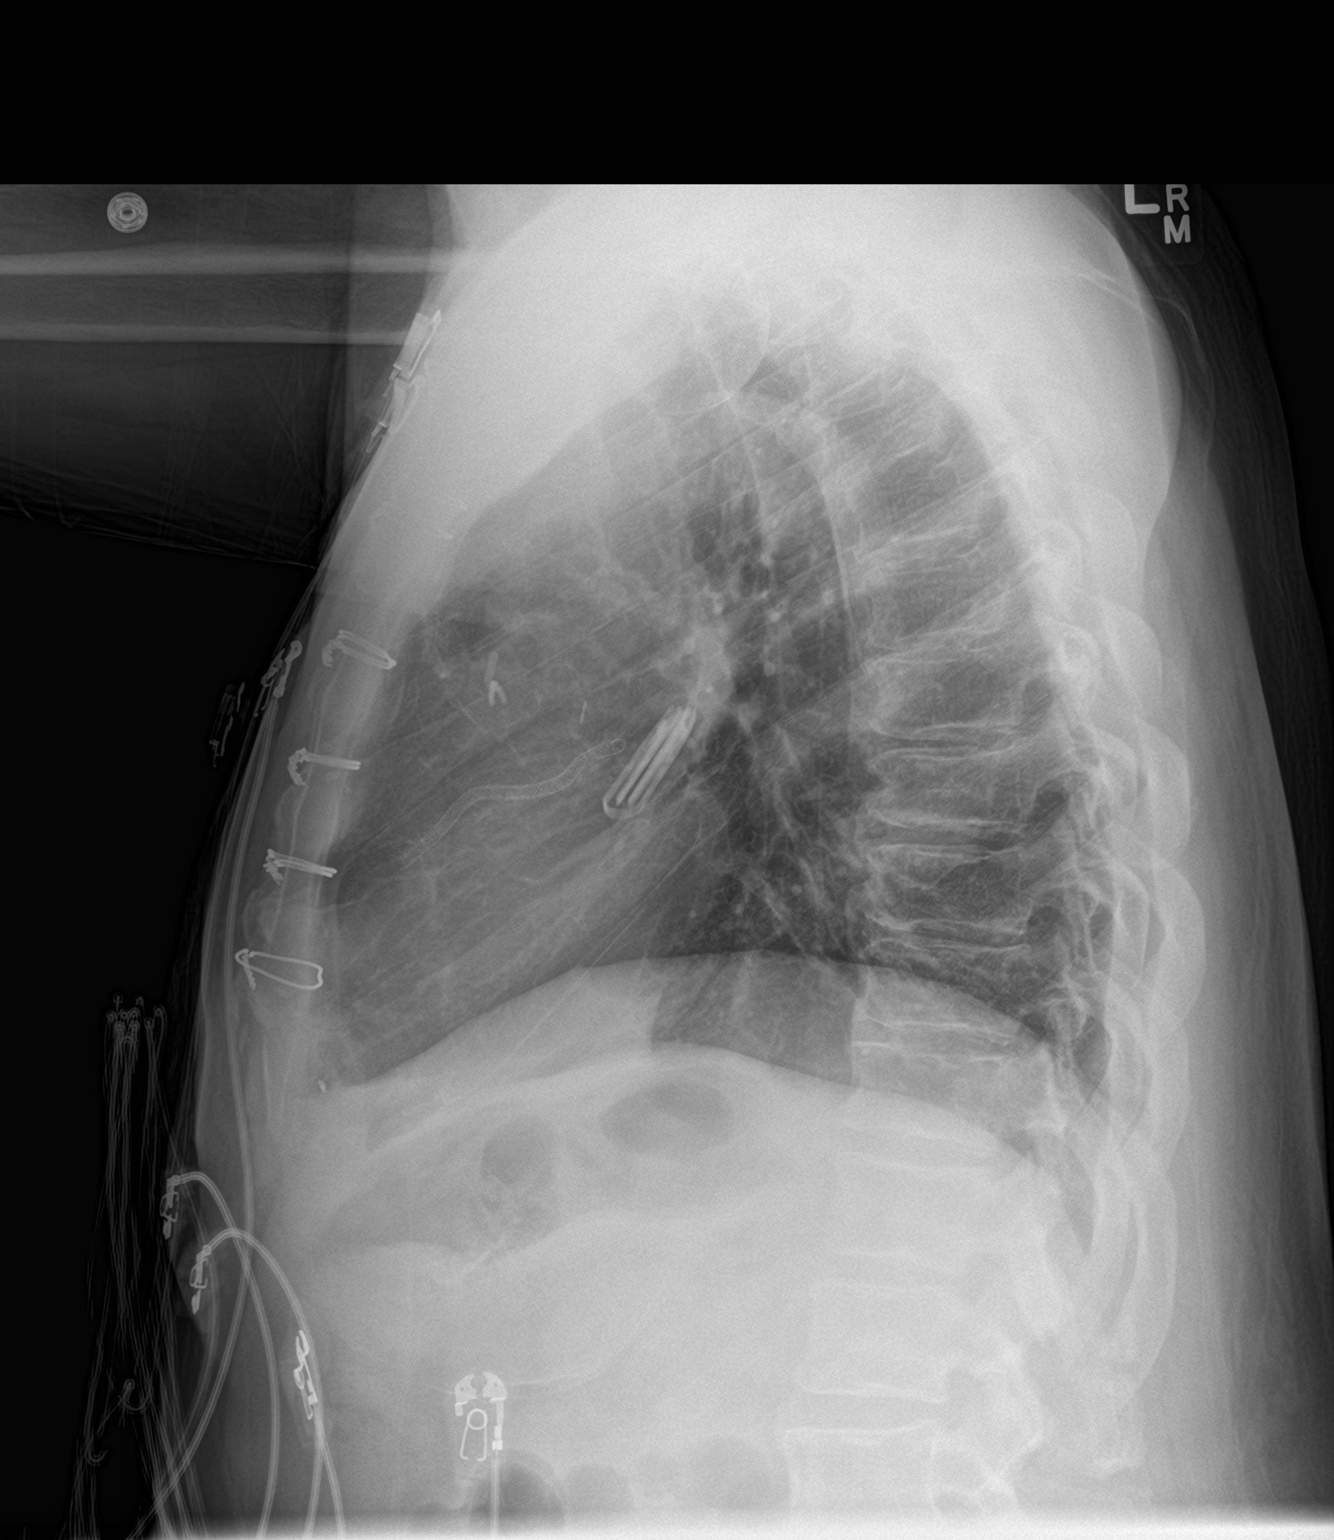

[2 of 2 positions shown; findings below may reference images not displayed]

FINDINGS: Grossly unchanged cardiac silhouette and mediastinal contours post
median sternotomy and atrial appendage clipping. Post coronary stent
placement. There is unchanged mild elevation the right
hemidiaphragm. No focal airspace opacities. No pleural effusion or
pneumothorax, no evidence of edema. No acute osseous abnormalities.
Post long segment cervical ACDF, incompletely evaluated.
IMPRESSION: No acute cardiopulmonary disease.

## 2020-04-17 ENCOUNTER — Other Ambulatory Visit: Payer: Self-pay | Admitting: Infectious Diseases

## 2020-04-17 DIAGNOSIS — B20 Human immunodeficiency virus [HIV] disease: Secondary | ICD-10-CM

## 2020-04-27 IMAGING — CT CT ANGIOGRAPHY CHEST
2 of 6 series · 17 of 46 positions shown · IV contrast (OMNIPAQUE)
Comparison: Chest CT 10/02/2018

CLINICAL DATA: Chest pain, fever, not productive cough and
generalized body aches for 3 days. Positive D-dimer.

EXAM:
CT ANGIOGRAPHY CHEST WITH CONTRAST
TECHNIQUE: Multidetector CT imaging of the chest was performed using the
standard protocol during bolus administration of intravenous
contrast. Multiplanar CT image reconstructions and MIPs were
obtained to evaluate the vascular anatomy.
CONTRAST:  100mL OMNIPAQUE IOHEXOL 350 MG/ML SOLN

[Series 5: thins · axial · 0.72mm/px · z∈[-324,-56]mm · 14 of 295 slices shown]
[im 13/295  lung]
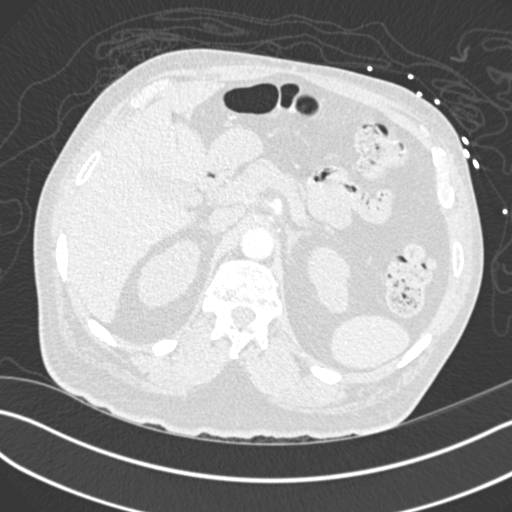
[im 39/295  soft-tissue]
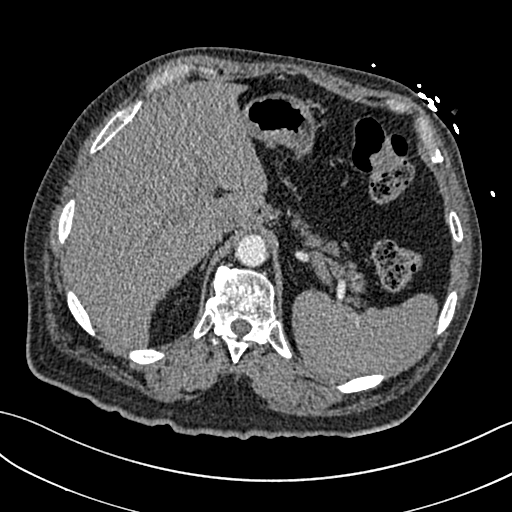
[im 52/295  lung]
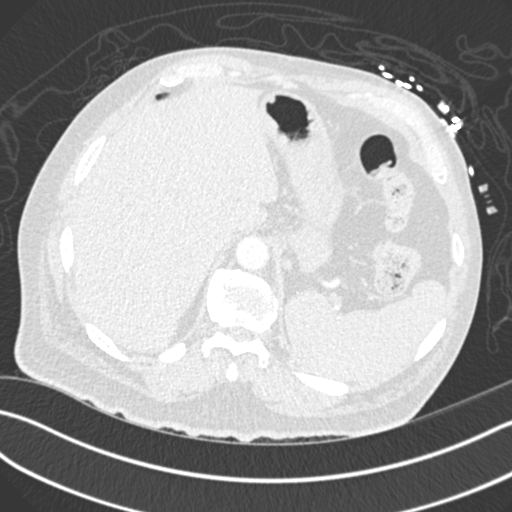
[im 77/295  soft-tissue]
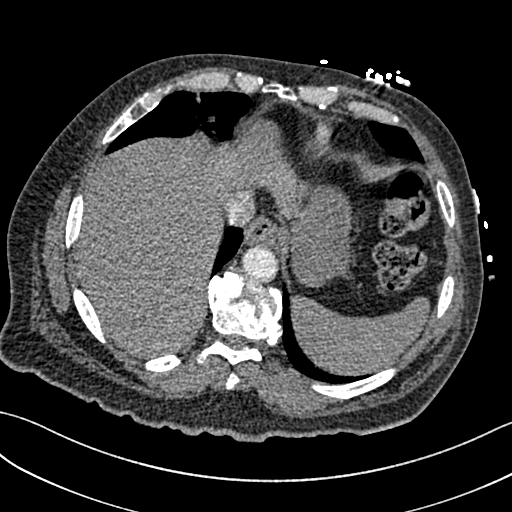
[im 103/295  lung]
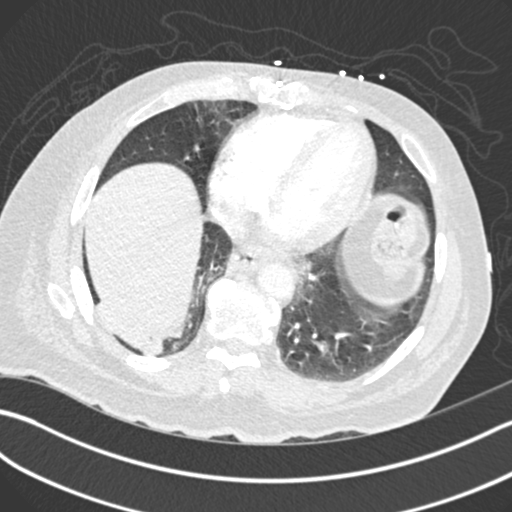
[im 116/295  soft-tissue]
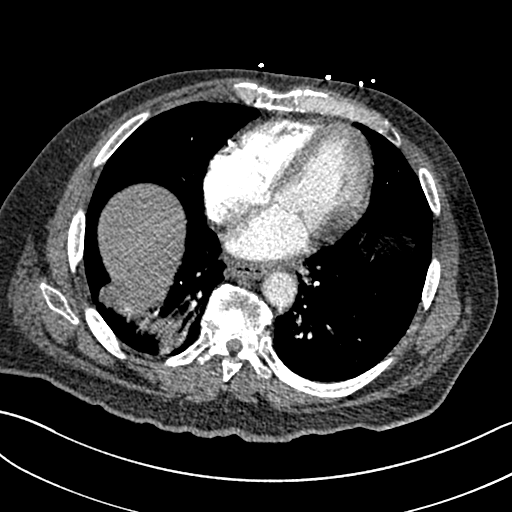
[im 141/295  lung]
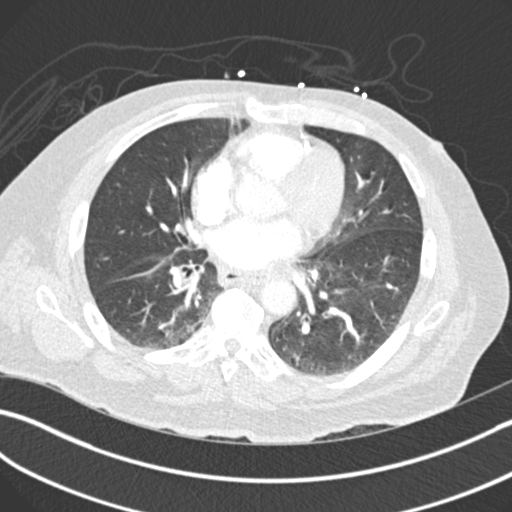
[im 154/295  soft-tissue]
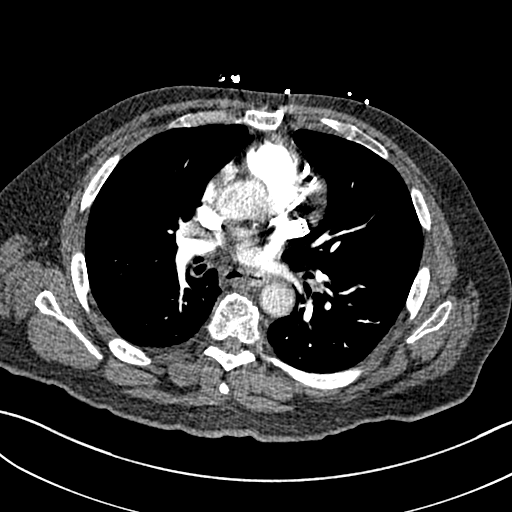
[im 179/295  lung]
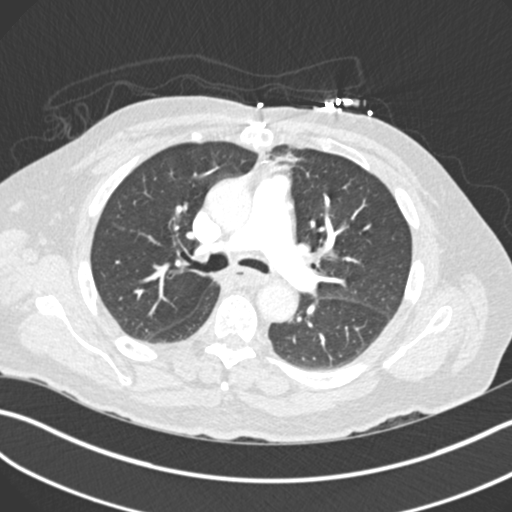
[im 192/295  soft-tissue]
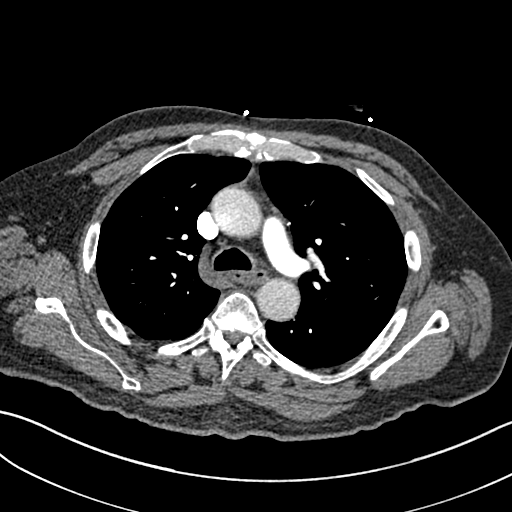
[im 218/295  lung]
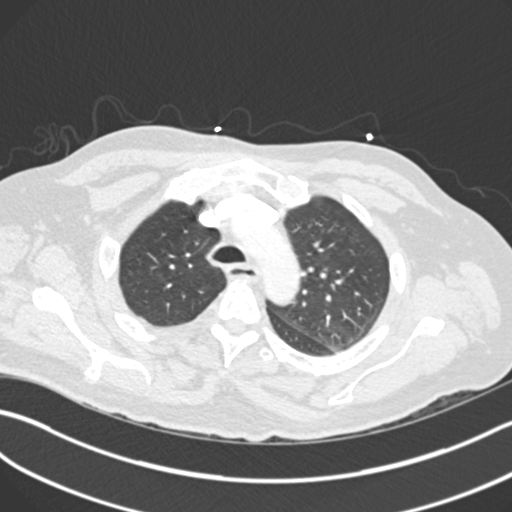
[im 243/295  soft-tissue]
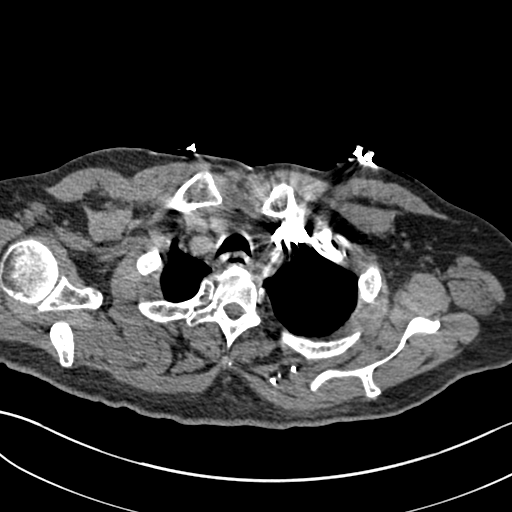
[im 256/295  lung]
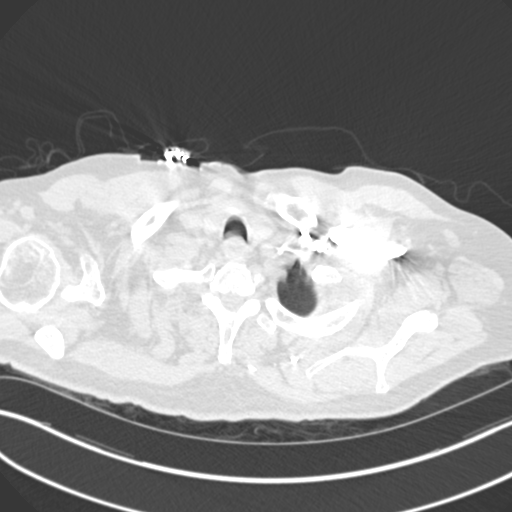
[im 282/295  soft-tissue]
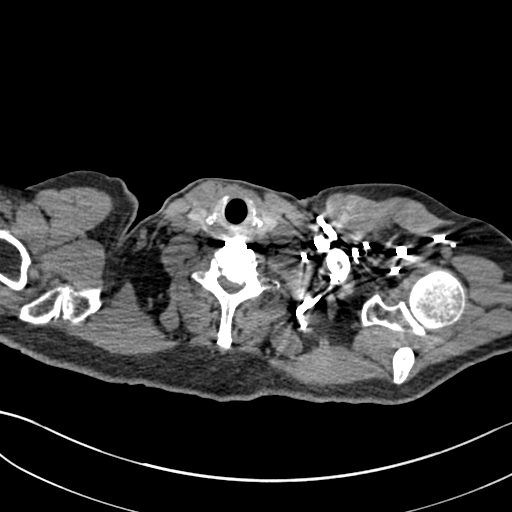

[Series 7: coronal mpr · coronal · 0.59mm/px · 3 of 153 slices shown]
[im 39/153  soft-tissue]
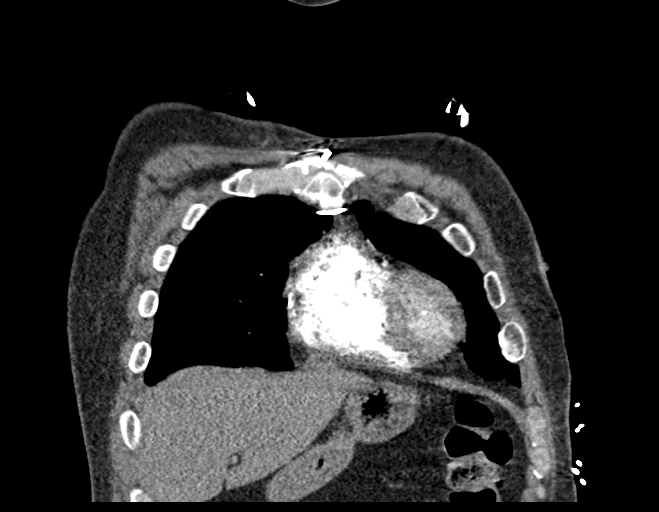
[im 77/153  soft-tissue]
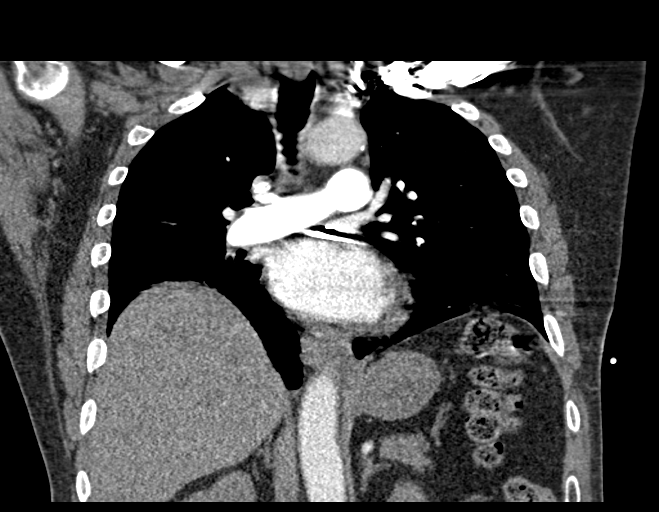
[im 115/153  soft-tissue]
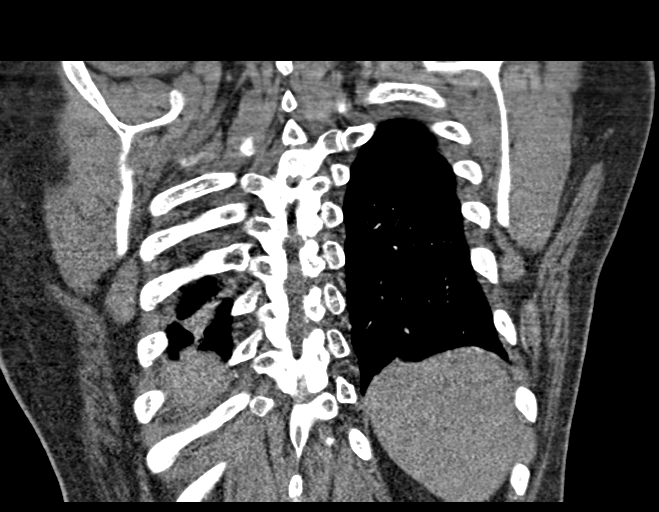

[17 of 46 positions shown; findings below may reference images not displayed]

FINDINGS: Cardiovascular: The heart is normal in size and stable. No
pericardial effusion. There is mild tortuosity and scattered
calcifications involving the thoracic aorta. Stable surgical changes
from coronary artery bypass surgery and stable coronary artery
calcifications. Left atrial appendage closure device noted.

The pulmonary arterial tree is well opacified. No filling defects to
suggest pulmonary embolism.

Mediastinum/Nodes: Small scattered mediastinal and hilar lymph nodes
but no mass or adenopathy. Mildly thick-walled patulous esophagus is
noted without evidence of a mass. Esophagitis is possible. Recommend
clinical correlation with any symptoms of dysphagia.

Lungs/Pleura: Stable scarring changes involving the left upper lobe
medially likely related to surgery. There are also right basilar
scarring changes. No infiltrates or effusions. No worrisome
pulmonary lesions or pulmonary edema. No bronchiectasis or
interstitial lung disease.

Upper Abdomen: No significant upper abdominal findings. No worrisome
hepatic or adrenal gland lesions.

Musculoskeletal: No chest wall mass, supraclavicular or axillary
adenopathy.

The bony thorax is intact. Stable surgical changes involving the
lower cervical spine and sternum. Degenerative changes involving the
thoracic spine but no worrisome bone lesions.

Review of the MIP images confirms the above findings.
IMPRESSION: 1. No CT findings for acute pulmonary embolism.
2. No evidence of aortic aneurysm or dissection.
3. Slightly thick-walled patulous esophagus without evidence for
mass. Possible esophagitis. Recommend clinical correlation.
4. Left upper lobe and right basilar scarring changes but no
worrisome pulmonary lesions or acute pulmonary findings.

Aortic Atherosclerosis ([id number]).

## 2020-04-27 IMAGING — DX PORTABLE CHEST - 1 VIEW
1 series · 1 of 1 positions shown · non-contrast
Comparison: 01/10/2019

CLINICAL DATA: Nonproductive cough and fever for several days

EXAM:
PORTABLE CHEST 1 VIEW

[chest ap]
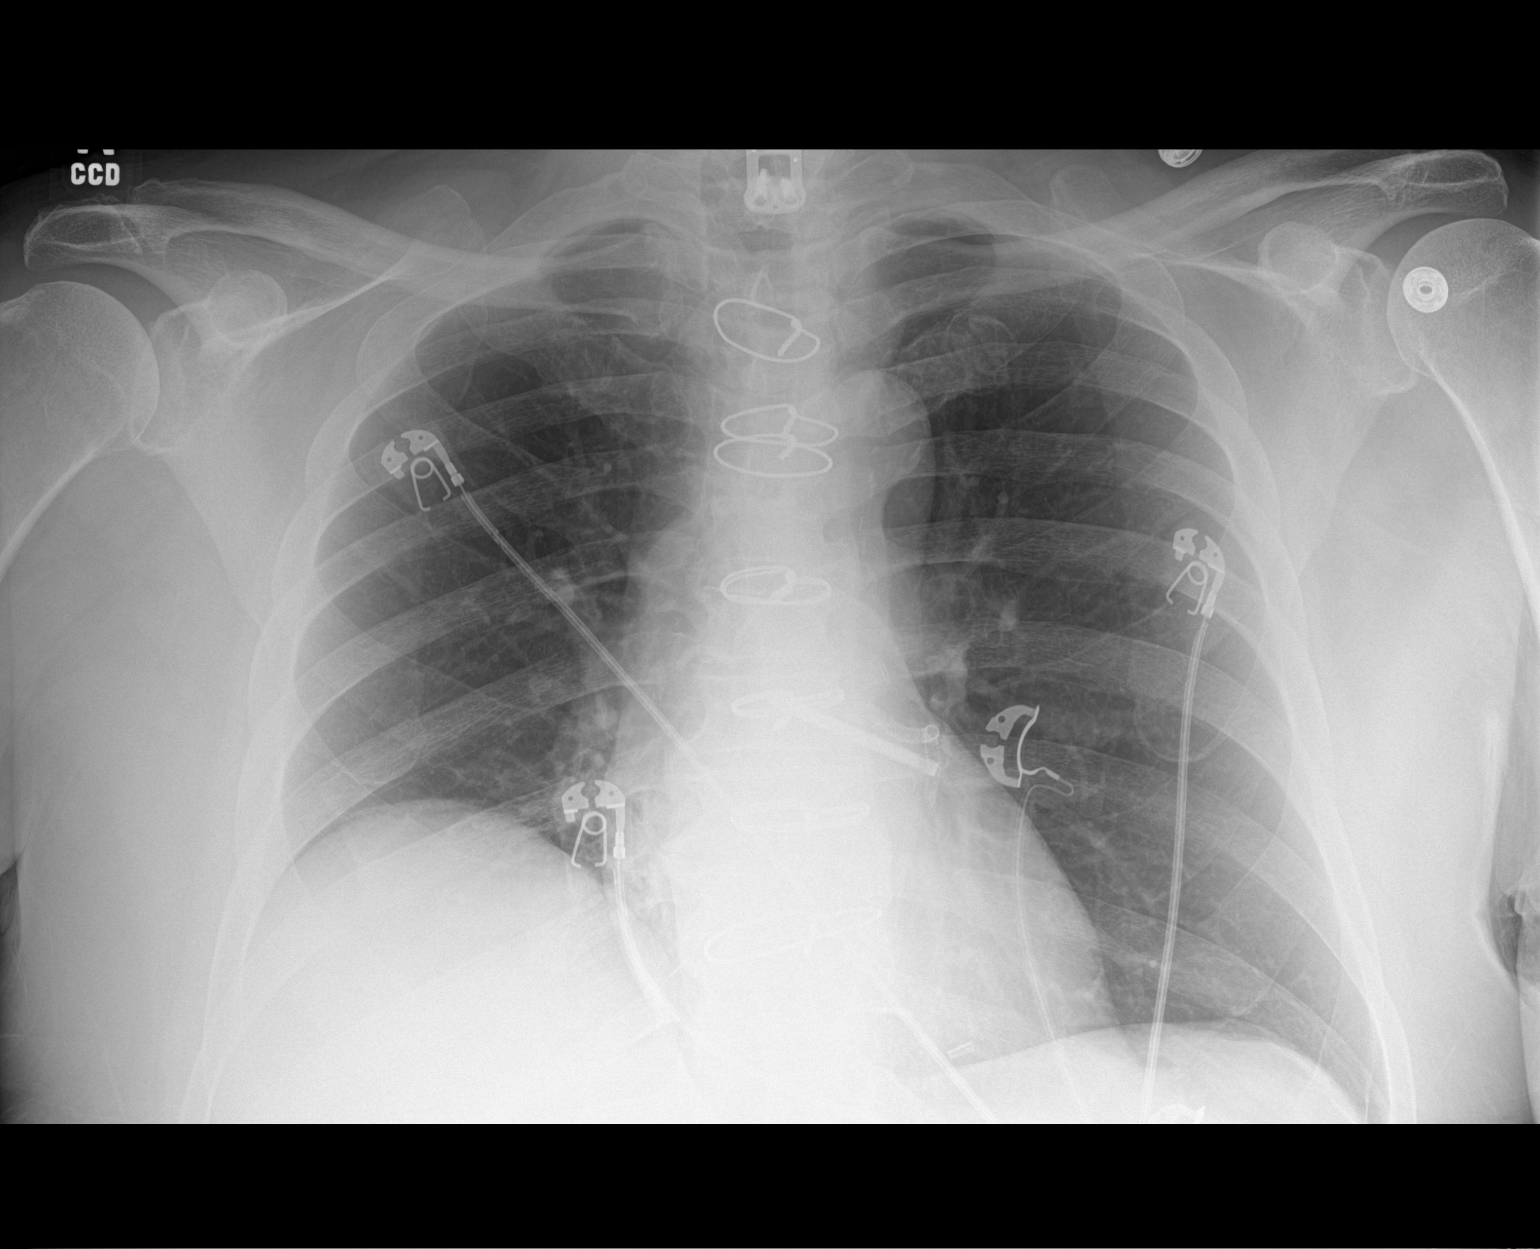

[1 of 1 positions shown; findings below may reference images not displayed]

FINDINGS: Postsurgical changes are noted and stable. Elevation the right
hemidiaphragm is noted greater than that seen on the prior exam. No
focal infiltrate or sizable effusion is seen. No bony abnormality is
noted. Postsurgical changes in the cervical spine are seen.
IMPRESSION: Increase in elevation of the right hemidiaphragm without definitive
infiltrate.

No acute abnormality noted.

## 2020-05-20 IMAGING — DX PORTABLE CHEST - 1 VIEW
1 series · 1 of 1 positions shown · non-contrast
Comparison: Chest radiograph 03/19/2019

CLINICAL DATA: Shortness of breath.

EXAM:
PORTABLE CHEST 1 VIEW

[chest ap]
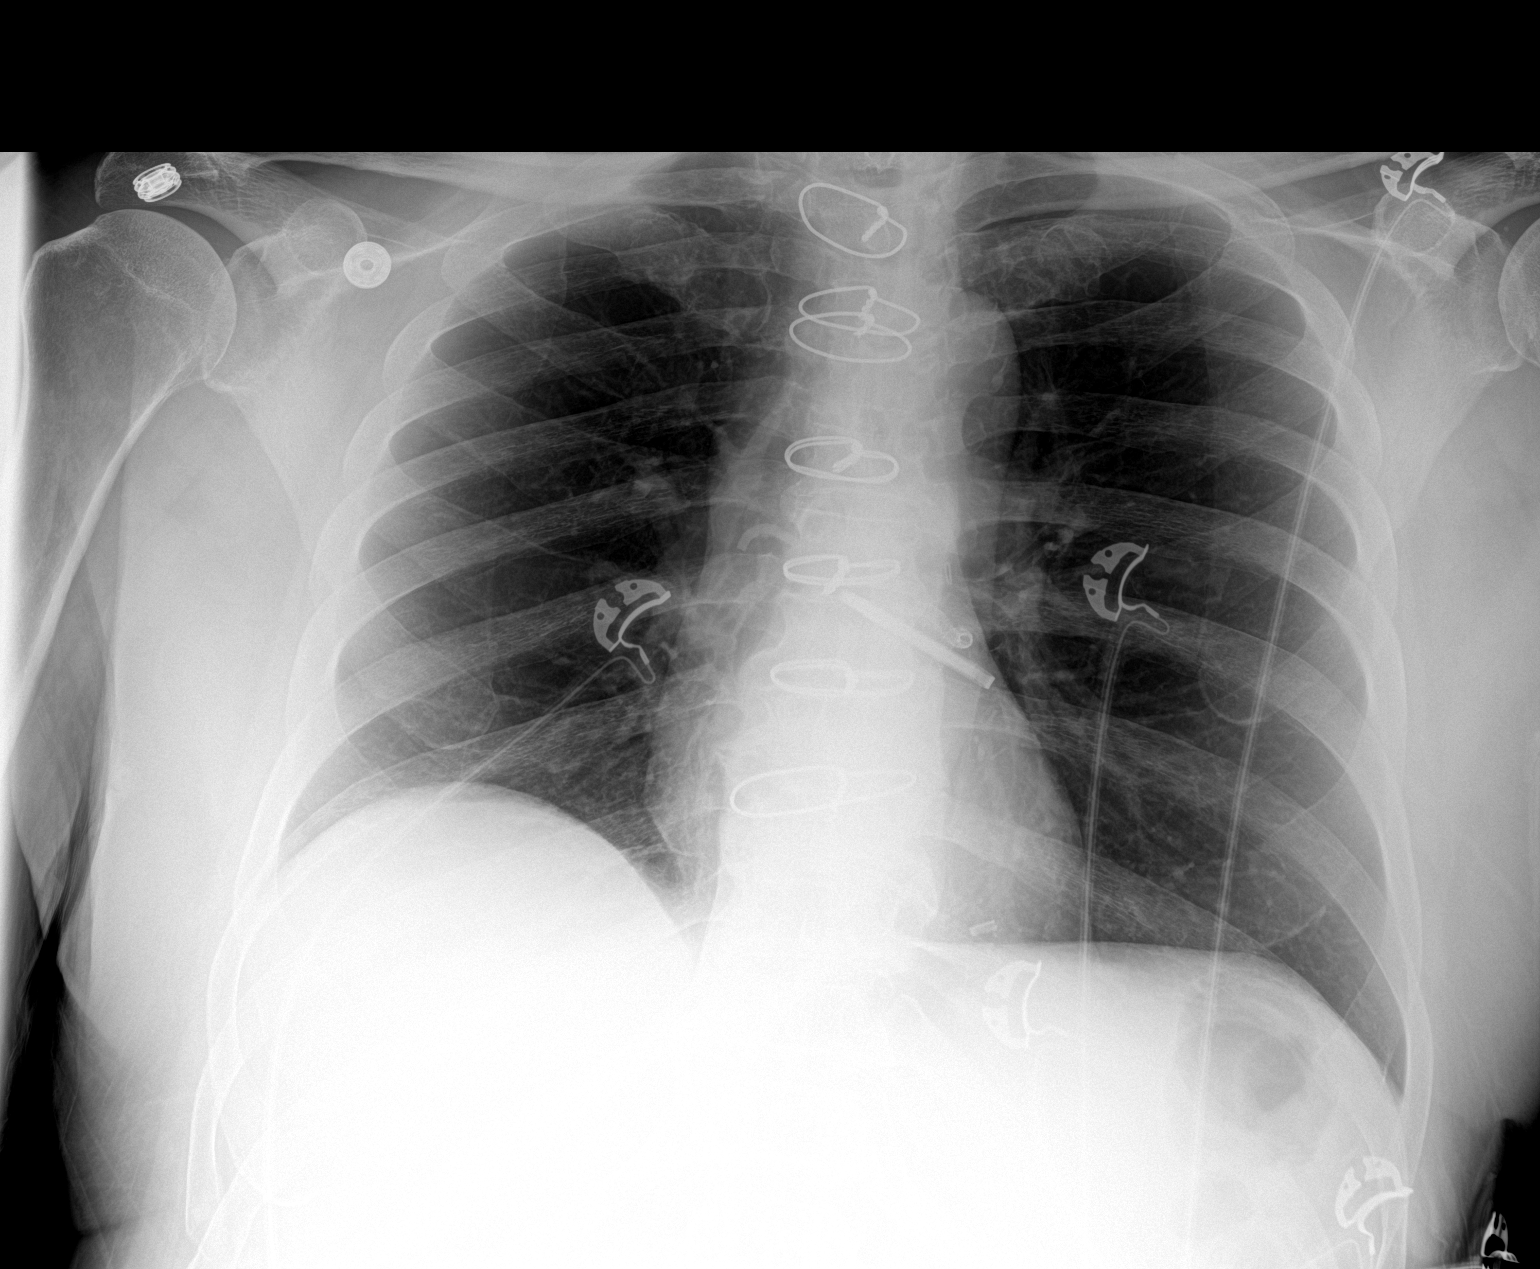

[1 of 1 positions shown; findings below may reference images not displayed]

FINDINGS: Monitoring leads overlie the patient. Patient status post median
sternotomy. Normal cardiac and mediastinal contours. No
consolidative pulmonary opacities. No pleural effusion or
pneumothorax.
IMPRESSION: No acute cardiopulmonary process.

## 2020-05-20 IMAGING — CT CT HEAD WITHOUT CONTRAST
3 series · 15 of 46 positions shown, 18 images · non-contrast
Comparison: Brain CT 09/02/2018

CLINICAL DATA: Patient with headache for 3-4 days. Recently
recovered from COVID.

EXAM:
CT HEAD WITHOUT CONTRAST
TECHNIQUE: Contiguous axial images were obtained from the base of the skull
through the vertex without intravenous contrast.

[Series 2: head wo · axial · 0.47mm/px · z∈[-113,+7]mm · 9 of 29 slices shown, 12 images]
[im 3/29  brain]
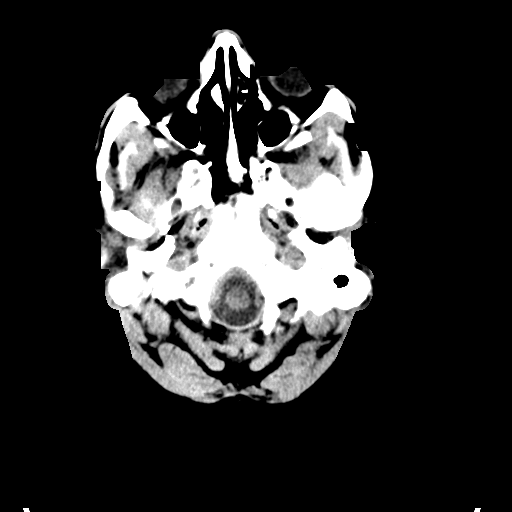
[im 3/29  bone]
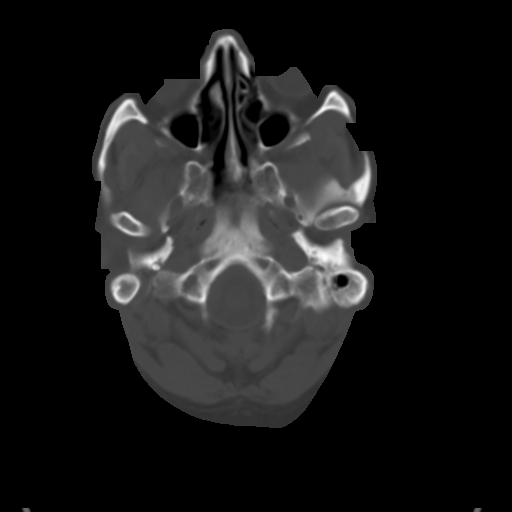
[im 6/29  brain]
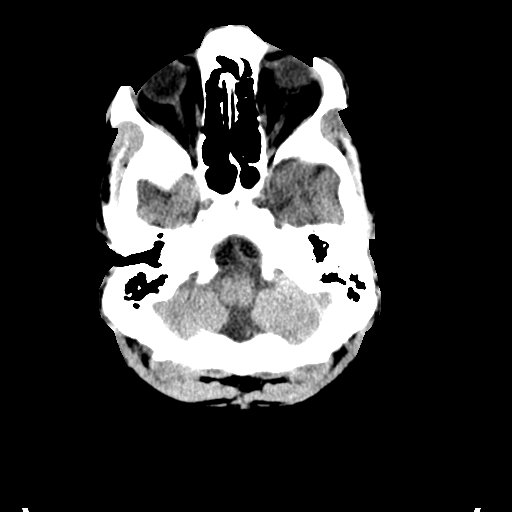
[im 9/29  brain]
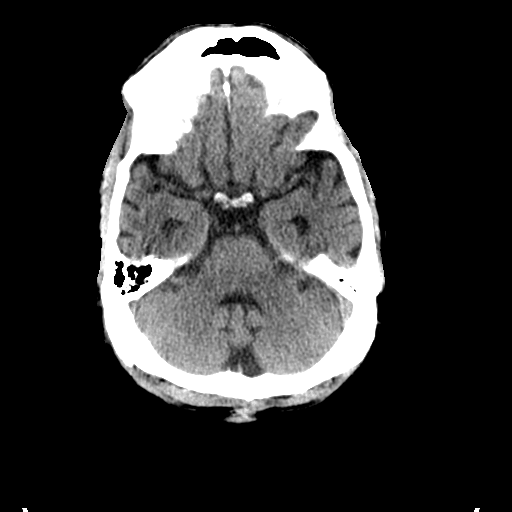
[im 12/29  brain]
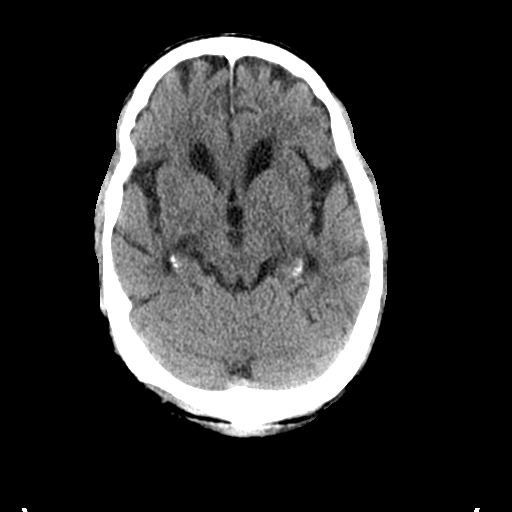
[im 15/29  brain]
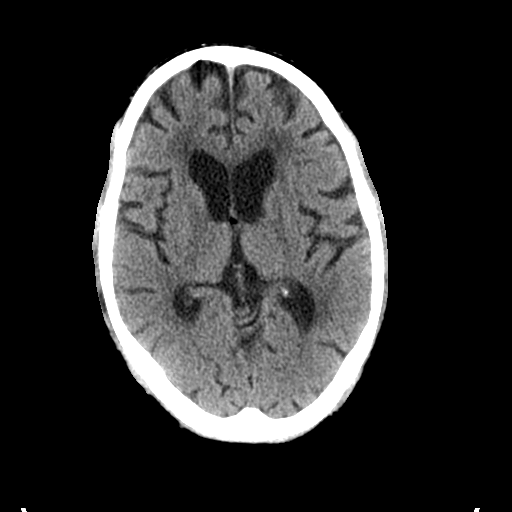
[im 15/29  bone]
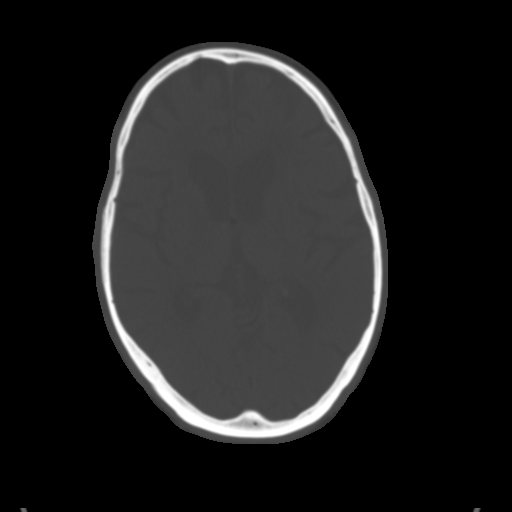
[im 18/29  brain]
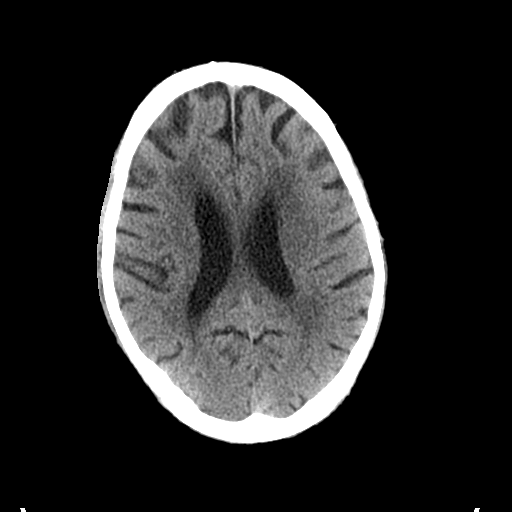
[im 21/29  brain]
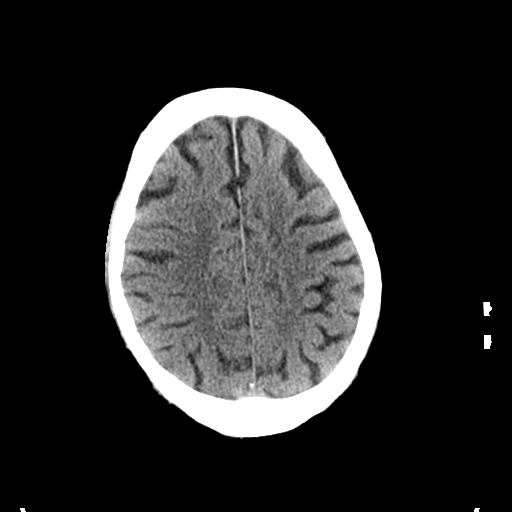
[im 24/29  brain]
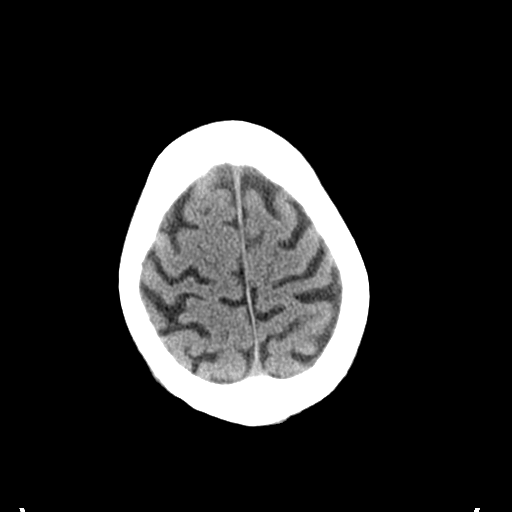
[im 27/29  brain]
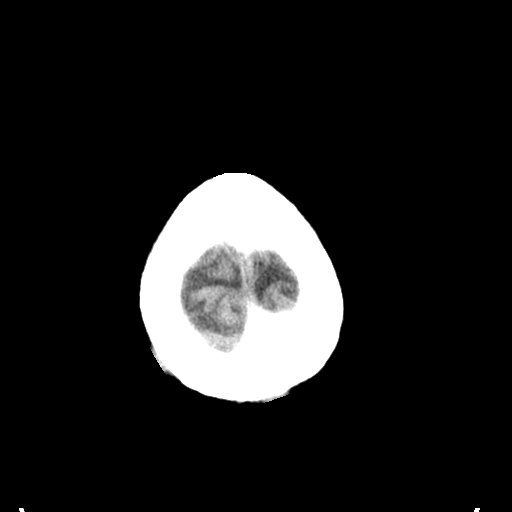
[im 27/29  bone]
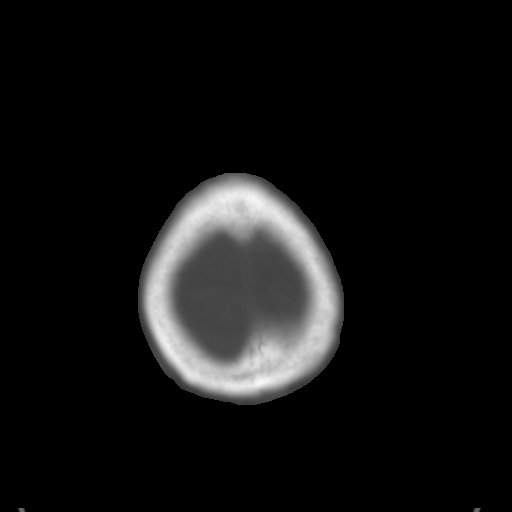

[Series 4: coronal soft tissue · coronal · 0.28mm/px · 3 of 63 slices shown]
[im 21/63  brain]
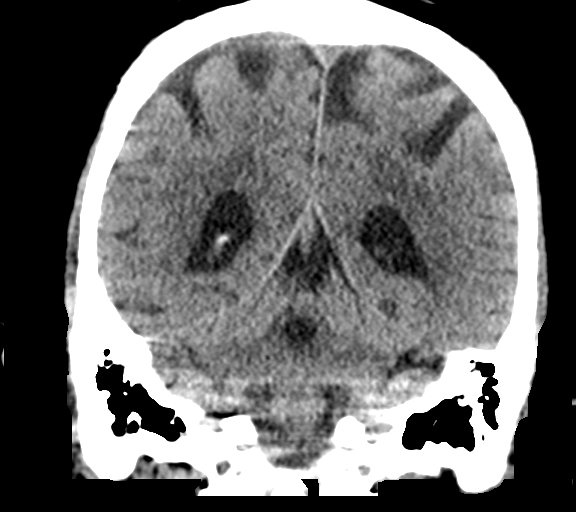
[im 28/63  brain]
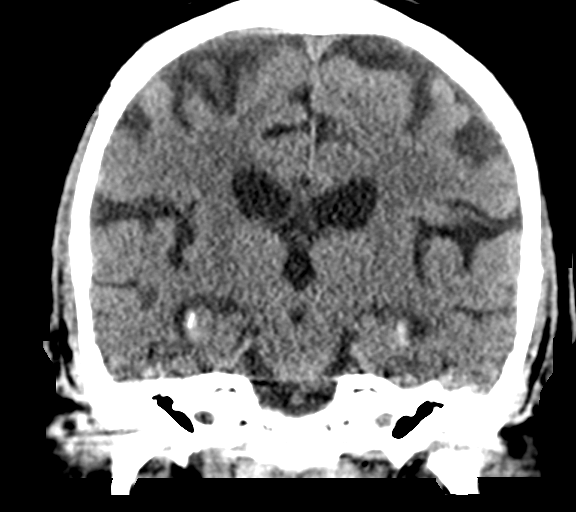
[im 35/63  brain]
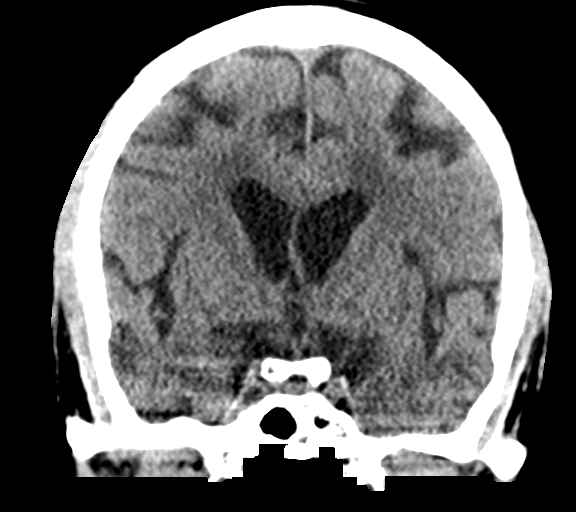

[Series 5: sagittal soft tissue · sagittal · 0.28mm/px · 3 of 54 slices shown]
[im 18/54  brain]
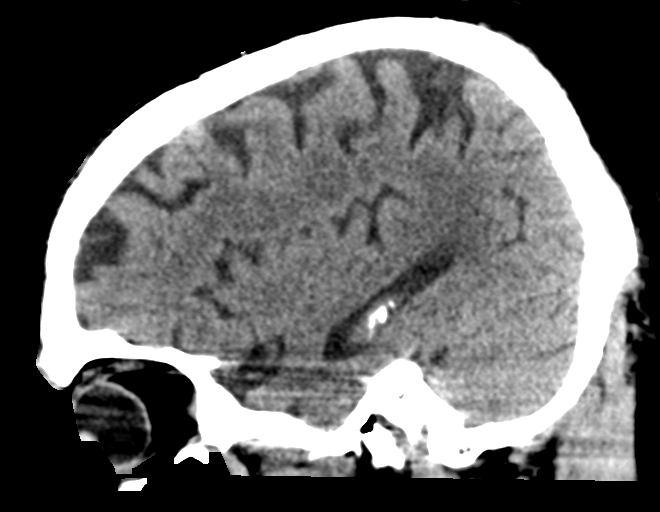
[im 27/54  brain]
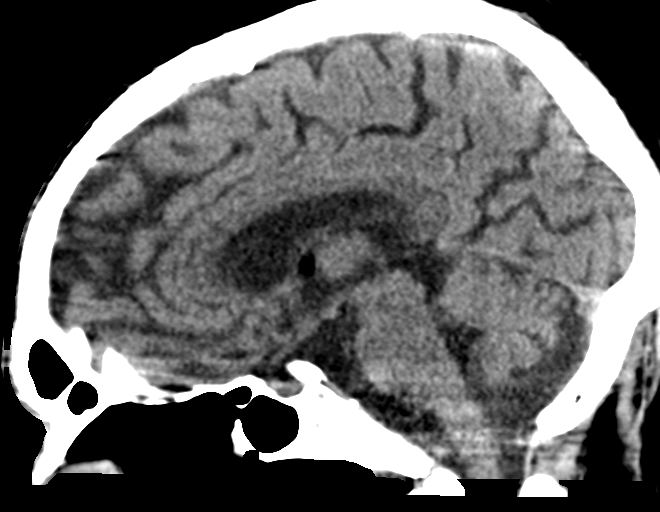
[im 36/54  brain]
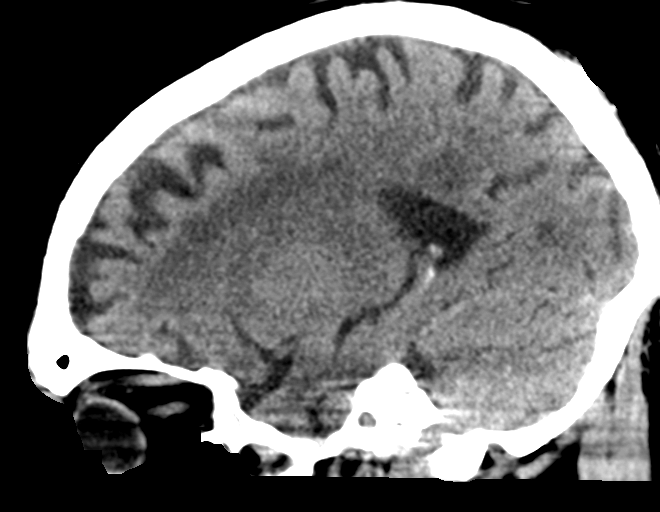

[15 of 46 positions shown; findings below may reference images not displayed]

FINDINGS: Brain: Ventricles and sulci are prominent compatible with atrophy.
Periventricular and subcortical white matter hypodensity compatible
with chronic microvascular ischemic changes. No evidence for acute
cortically based infarct, intracranial hemorrhage, mass lesion or
mass-effect.

Vascular: Unremarkable

Skull: Intact.

Sinuses/Orbits: Paranasal sinuses are well aerated. Mastoid air
cells are unremarkable. Orbits are unremarkable.

Other: None.
IMPRESSION: No acute intracranial process.

Atrophy and chronic microvascular ischemic changes.

## 2020-05-21 IMAGING — CT CT ANGIOGRAPHY CHEST
1 of 5 series · 12 of 30 positions shown · IV contrast (OMNIPAQUE)
Comparison: 03/19/2019

CLINICAL DATA: Pt states that he has been short of breath and can't
catch his breath Patient discharged form [HOSPITAL]on [DATE], repeat covid
test + history of PE

EXAM:
CT ANGIOGRAPHY CHEST WITH CONTRAST
TECHNIQUE: Multidetector CT imaging of the chest was performed using the
standard protocol during bolus administration of intravenous
contrast. Multiplanar CT image reconstructions and MIPs were
obtained to evaluate the vascular anatomy.
CONTRAST:  80mL OMNIPAQUE IOHEXOL 350 MG/ML SOLN

[Series 8: (person_name) thins · axial · 0.66mm/px · z∈[+768,+1032]mm · 12 of 160 slices shown]
[im 14/160  lung]
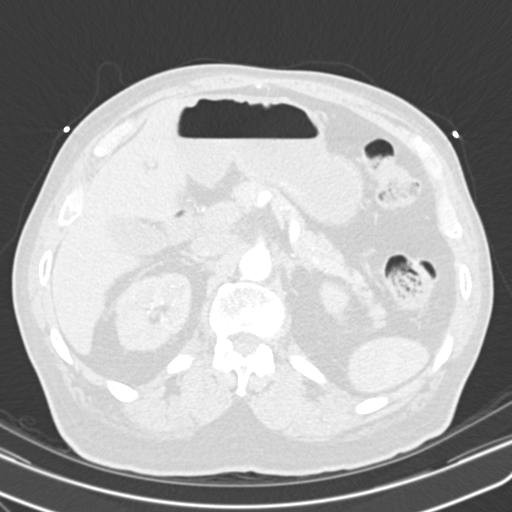
[im 27/160  mediastinal]
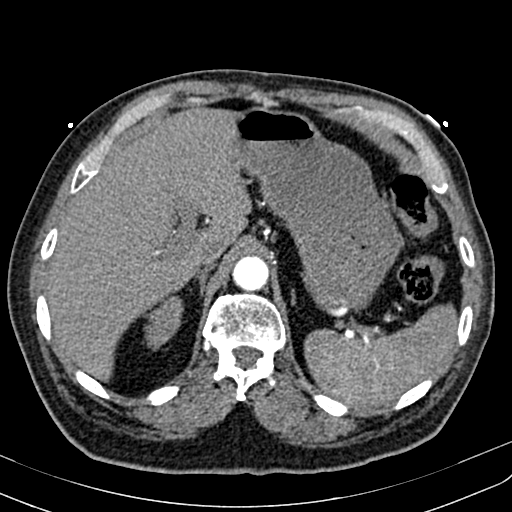
[im 40/160  lung]
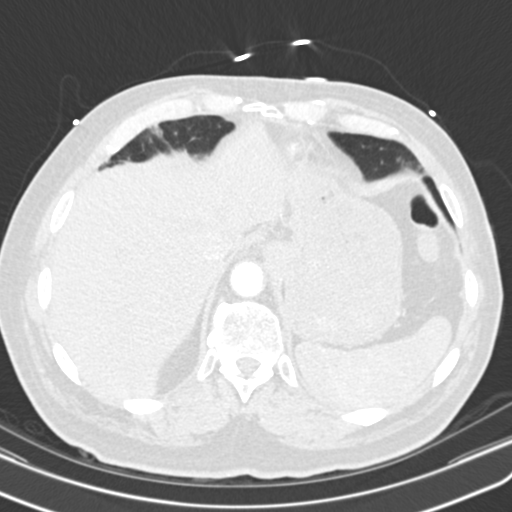
[im 54/160  mediastinal]
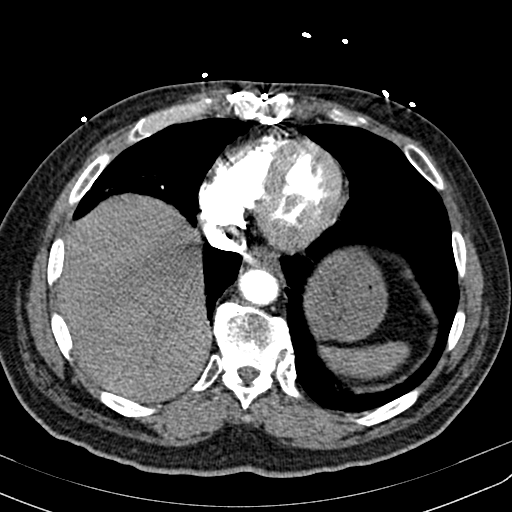
[im 67/160  lung]
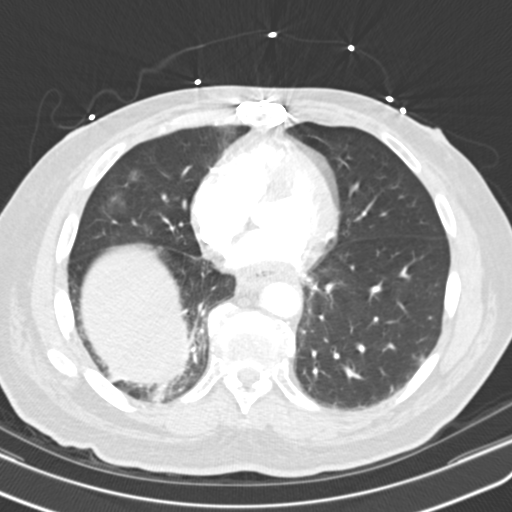
[im 76/160  mediastinal]
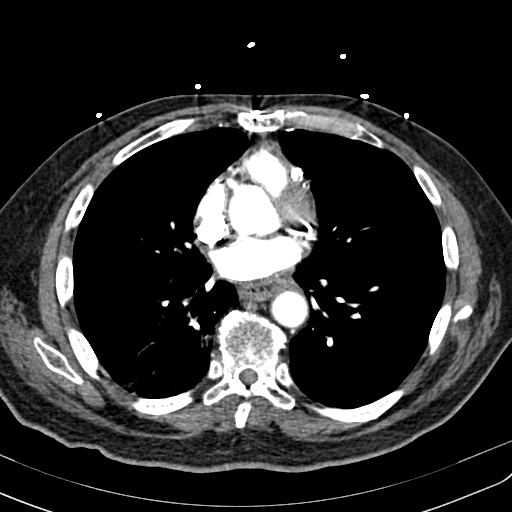
[im 80/160  lung]
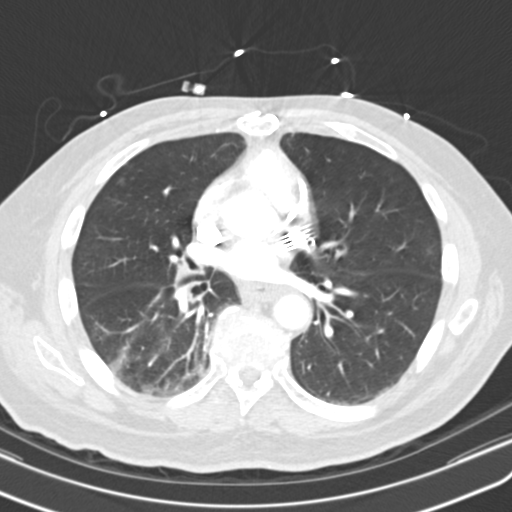
[im 93/160  mediastinal]
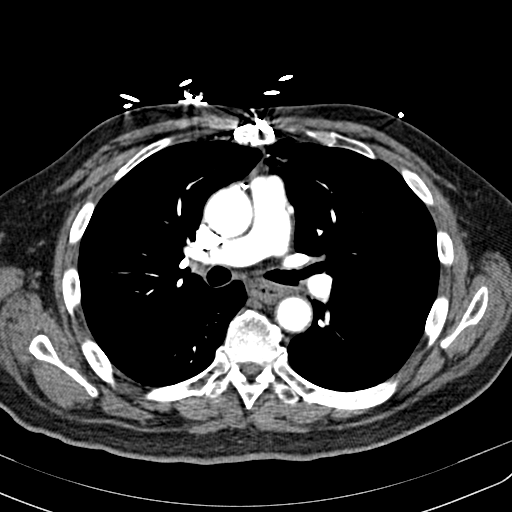
[im 107/160  lung]
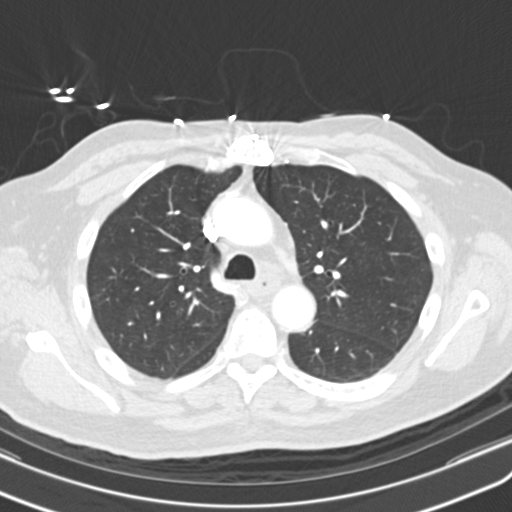
[im 120/160  mediastinal]
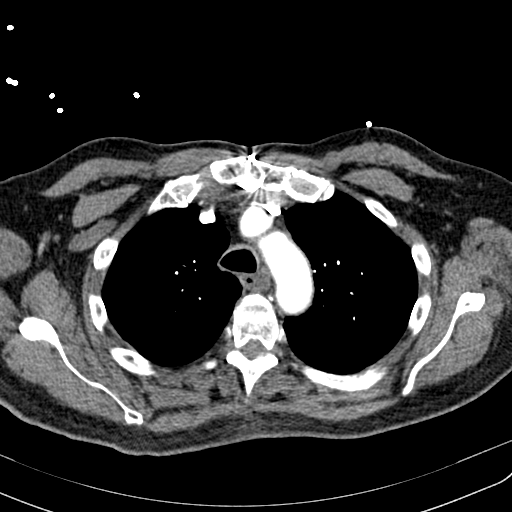
[im 133/160  lung]
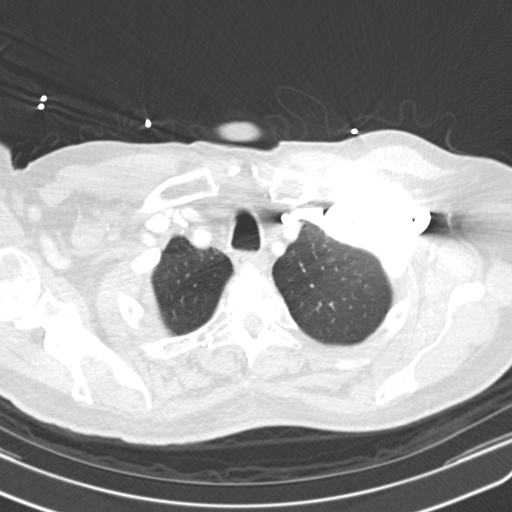
[im 146/160  mediastinal]
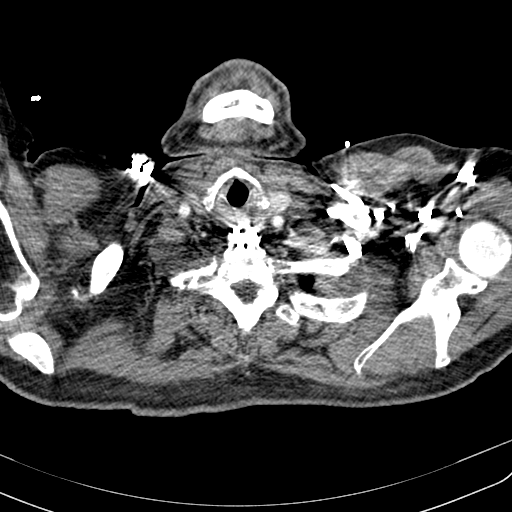

[12 of 30 positions shown; findings below may reference images not displayed]

FINDINGS: Cardiovascular: The pulmonary arteries are well opacified and there
is no acute pulmonary embolus. Status post CABG. Note is made of
bovine arch anatomy. Aorta is tortuous but not aneurysmal. There is
minimal atherosclerotic calcification of the thoracic arch.
Ascending aorta is 3.2 centimeters.

Mediastinum/Nodes: There is Conference will thickening of the
thoracic esophagus throughout its course, without mass. No
mediastinal, axillary, or hilar adenopathy. The visualized portion
of the thyroid gland has a normal appearance.

Lungs/Pleura: There has been interval development of peripheral
irregular ground-glass opacities primarily within the UPPER lobes
but also involving the LOWER lobes. There is stable RIGHT LOWER lobe
atelectasis or scarring. Mild bronchial wall thickening. No pleural
effusions or frank consolidations.

Upper Abdomen: No acute abnormality.

Musculoskeletal: Median sternotomy. Midthoracic spondylosis.

Review of the MIP images confirms the above findings.
IMPRESSION: 1. Technically adequate exam showing no acute pulmonary embolus.
2. Ascending thoracic aneurysm, 3.2 centimeters. Recommend annual
imaging followup by CTA or MRA. This recommendation follows 6818
ACCF/AHA/AATS/ACR/ASA/SCA/ANICIA/YOSHIFUMI/FRED/TALLANA Guidelines for the
Diagnosis and Management of Patients with Thoracic Aortic Disease.
Circulation.6818; 121: [id number]. Aortic aneurysm NOS ([id number])
3. Interval development of peripheral irregular ground-glass
opacities primarily within the UPPER lobes.
4. Esophageal wall thickening, nonspecific but possibly related to
esophagitis.

## 2020-06-01 IMAGING — DX PORTABLE ABDOMEN - 1 VIEW
1 series · 1 of 1 positions shown · non-contrast
Comparison: CT chest abdomen and pelvis 09/02/2018.

CLINICAL DATA: 68-year-old with possible constipation as he states
his last bowel movement was approximately 1 week ago. Patient
complains of rectal pain. [id number] positive approximately 2 months
ago.

EXAM:
PORTABLE ABDOMEN - 1 VIEW

[abdomen kub]
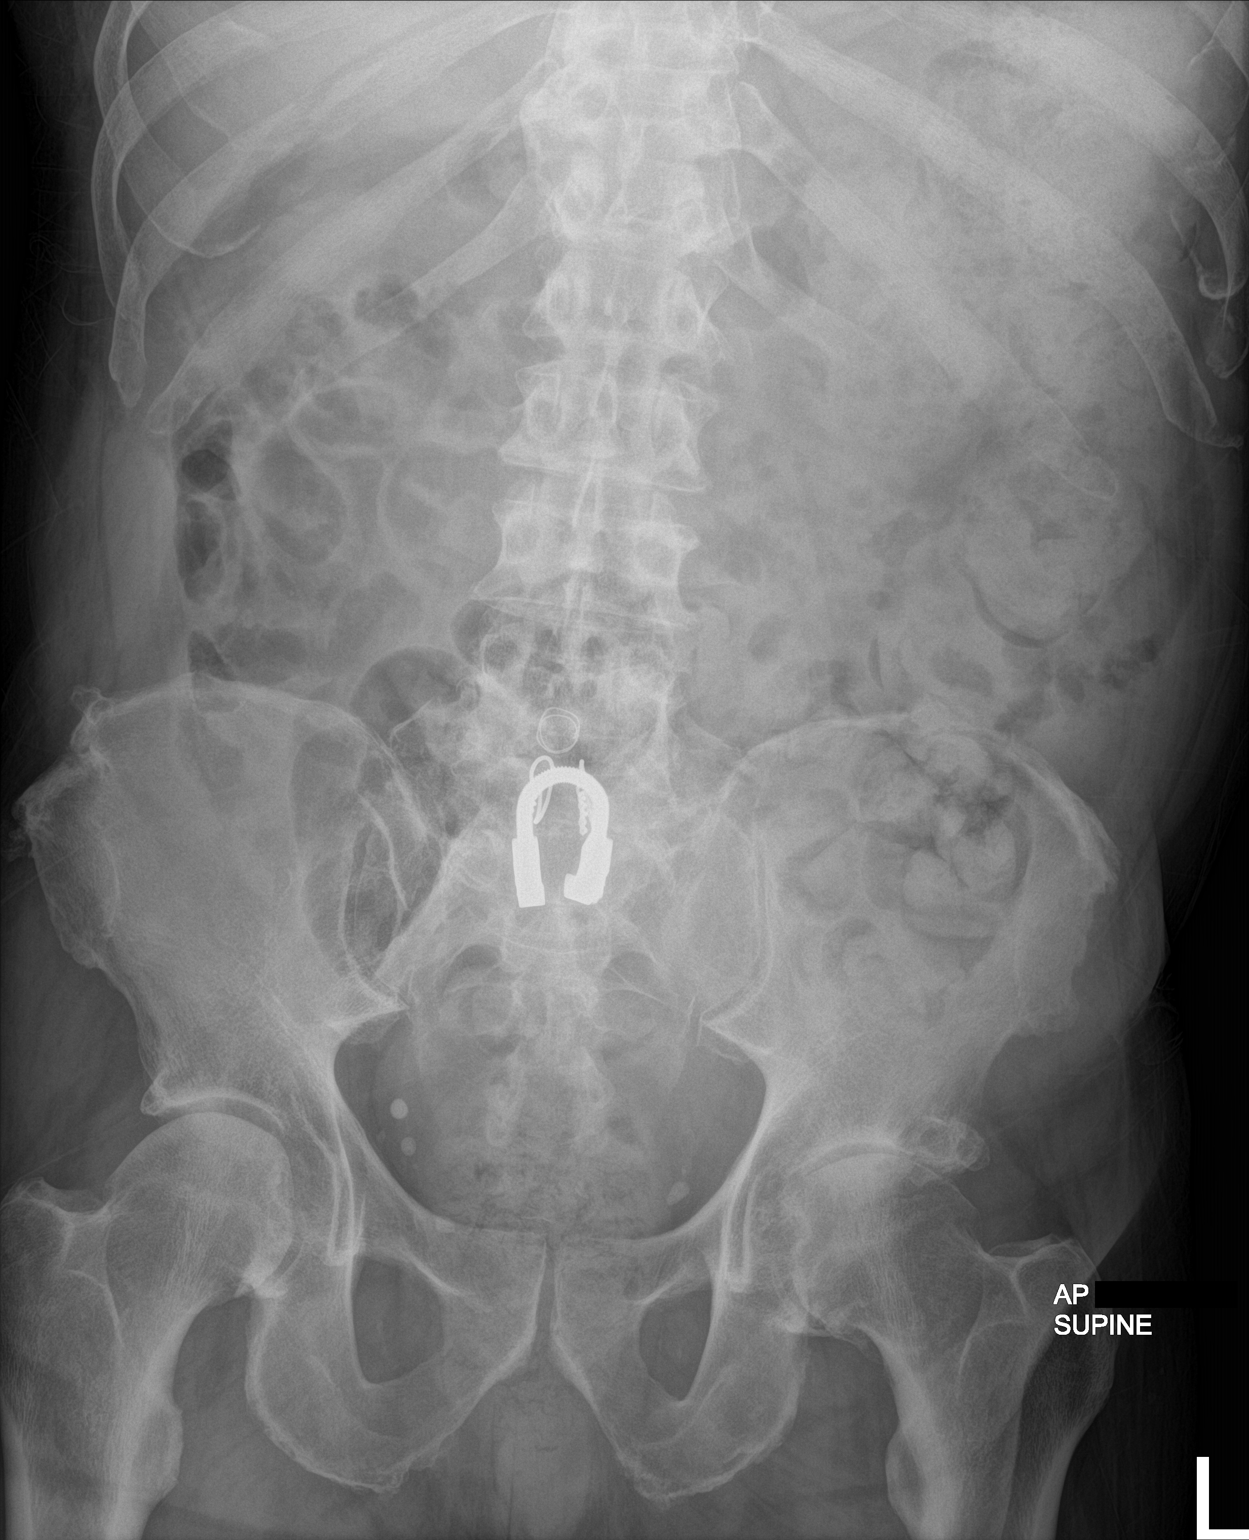

[1 of 1 positions shown; findings below may reference images not displayed]

FINDINGS: Bowel gas pattern unremarkable without evidence of obstruction or
significant ileus. Large stool burden in the colon, particularly the
descending colon, sigmoid colon and rectum. Phleboliths in the
pelvis. No visible opaque urinary tract calculi. Prior POSTERIOR
lumbosacral fusion. Severe degenerative changes involving the LEFT
hip and mild degenerative changes involving the RIGHT hip.
IMPRESSION: No acute abdominal abnormality. Large colonic stool burden.

## 2020-08-14 DIAGNOSIS — E1169 Type 2 diabetes mellitus with other specified complication: Secondary | ICD-10-CM | POA: Insufficient documentation

## 2020-08-14 DIAGNOSIS — E1165 Type 2 diabetes mellitus with hyperglycemia: Secondary | ICD-10-CM | POA: Insufficient documentation

## 2020-08-14 DIAGNOSIS — E119 Type 2 diabetes mellitus without complications: Secondary | ICD-10-CM | POA: Insufficient documentation

## 2020-08-24 IMAGING — CR DG FOOT COMPLETE 3+V*R*
3 series · 3 of 3 positions shown · non-contrast
Comparison: None.

CLINICAL DATA: Infection of the second toe. Swelling.

EXAM:
RIGHT FOOT COMPLETE - 3+ VIEW

[foot ap]
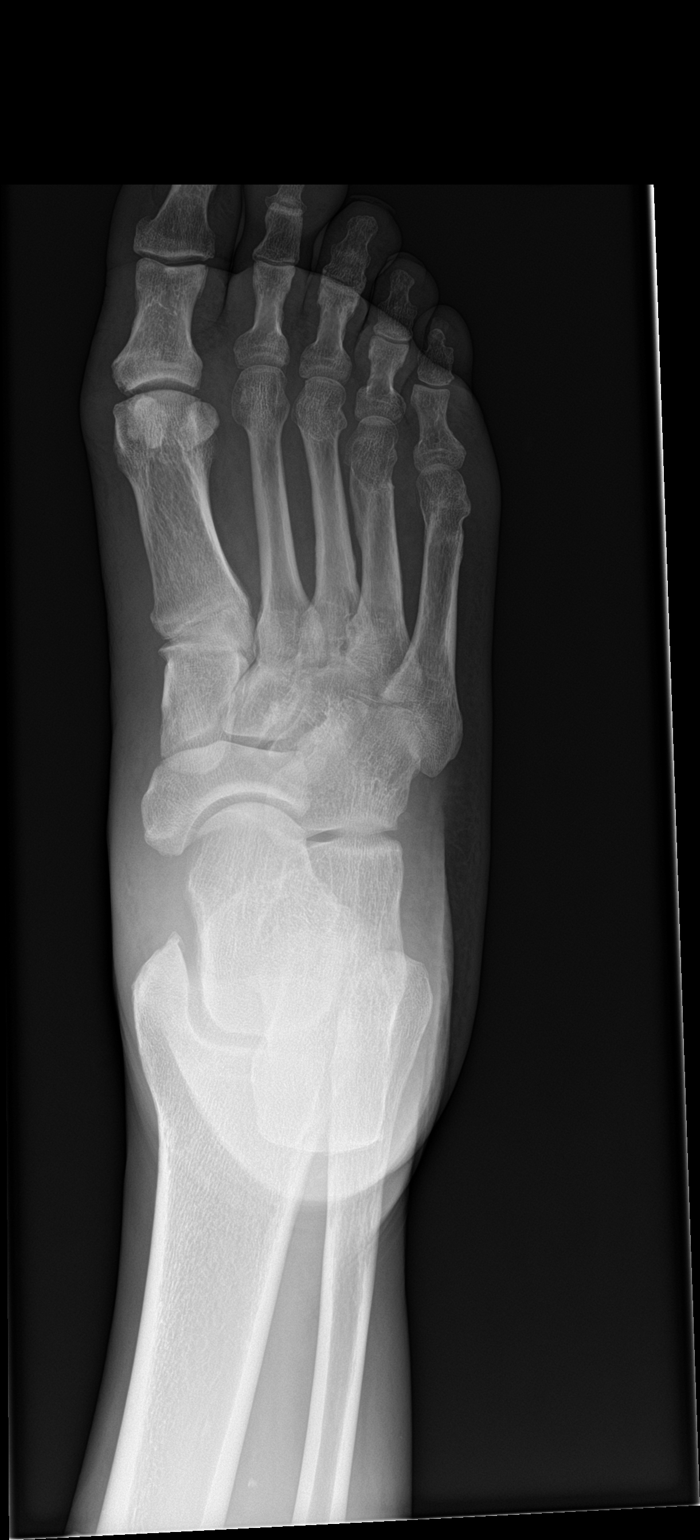

[foot obl]
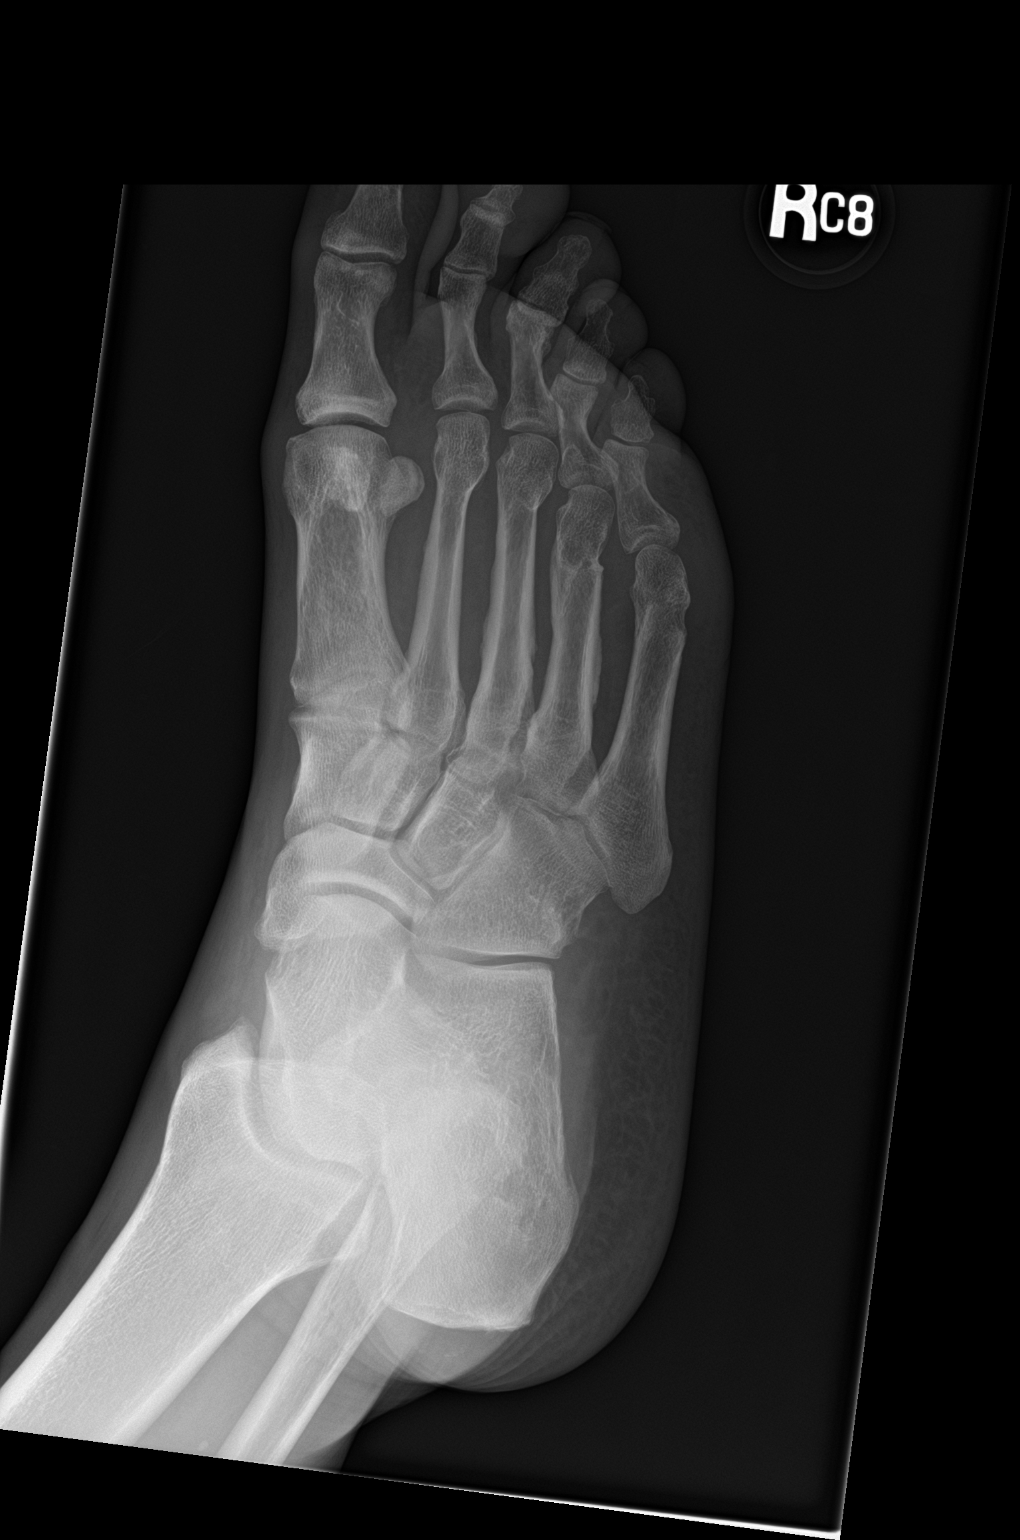

[foot lat]
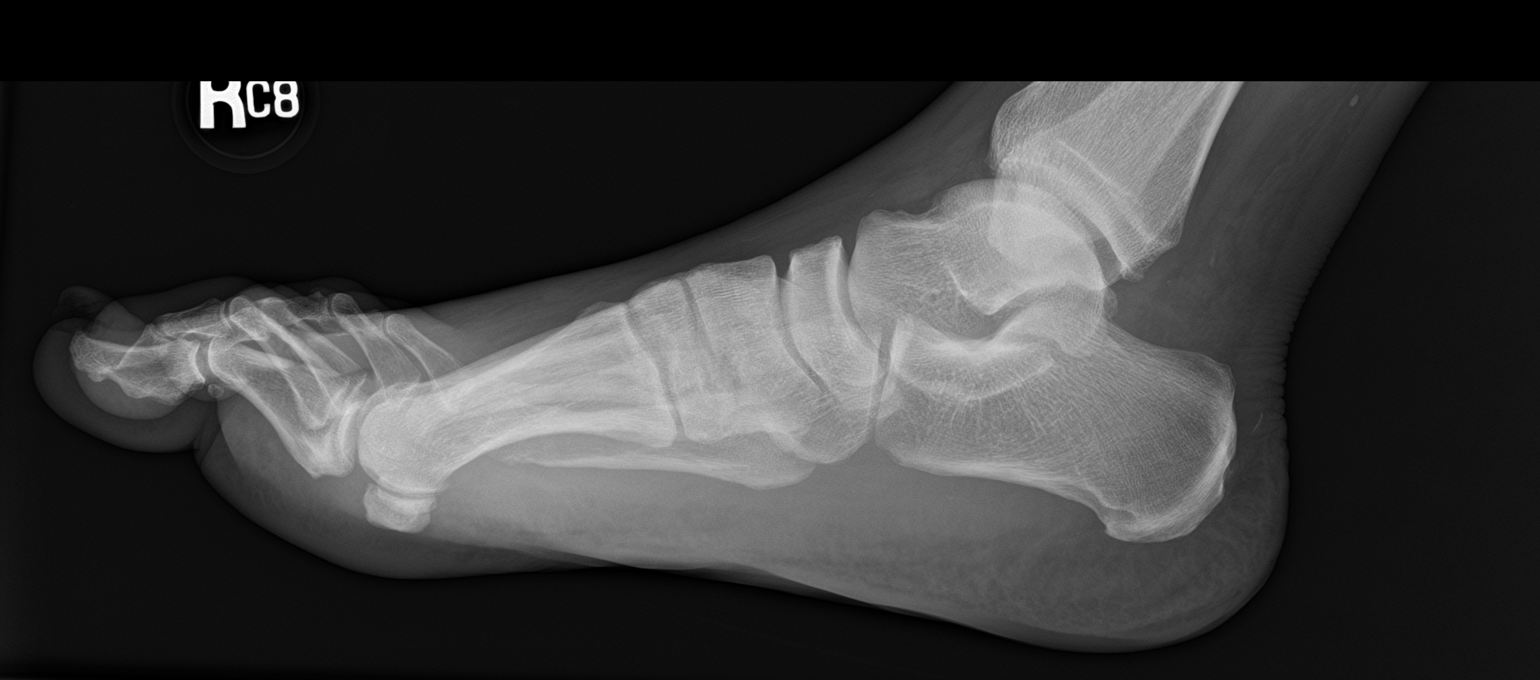

[3 of 3 positions shown; findings below may reference images not displayed]

FINDINGS: There is swelling of the soft tissues of the distal second toe. On
the lateral view the plantar aspect of the distal phalanx the second
toe appears to be eroded away. This is not appreciable on the AP and
oblique views.

Old healed fractures of the fourth and fifth metatarsal shafts.
Slight arthritic changes of the first MTP joint.
IMPRESSION: 1. Soft tissue swelling of the distal second toe.
2. The appearance of the plantar aspect of the distal phalanx of the
second toe is suspicious for osteomyelitis.
3. Arthritic changes of the first MTP joint.

## 2020-08-24 IMAGING — DX DG CHEST 2V
2 series · 2 of 2 positions shown · non-contrast
Comparison: Chest radiograph dated 04/11/2019

CLINICAL DATA: Chest pain

EXAM:
CHEST - 2 VIEW

[chest pa]
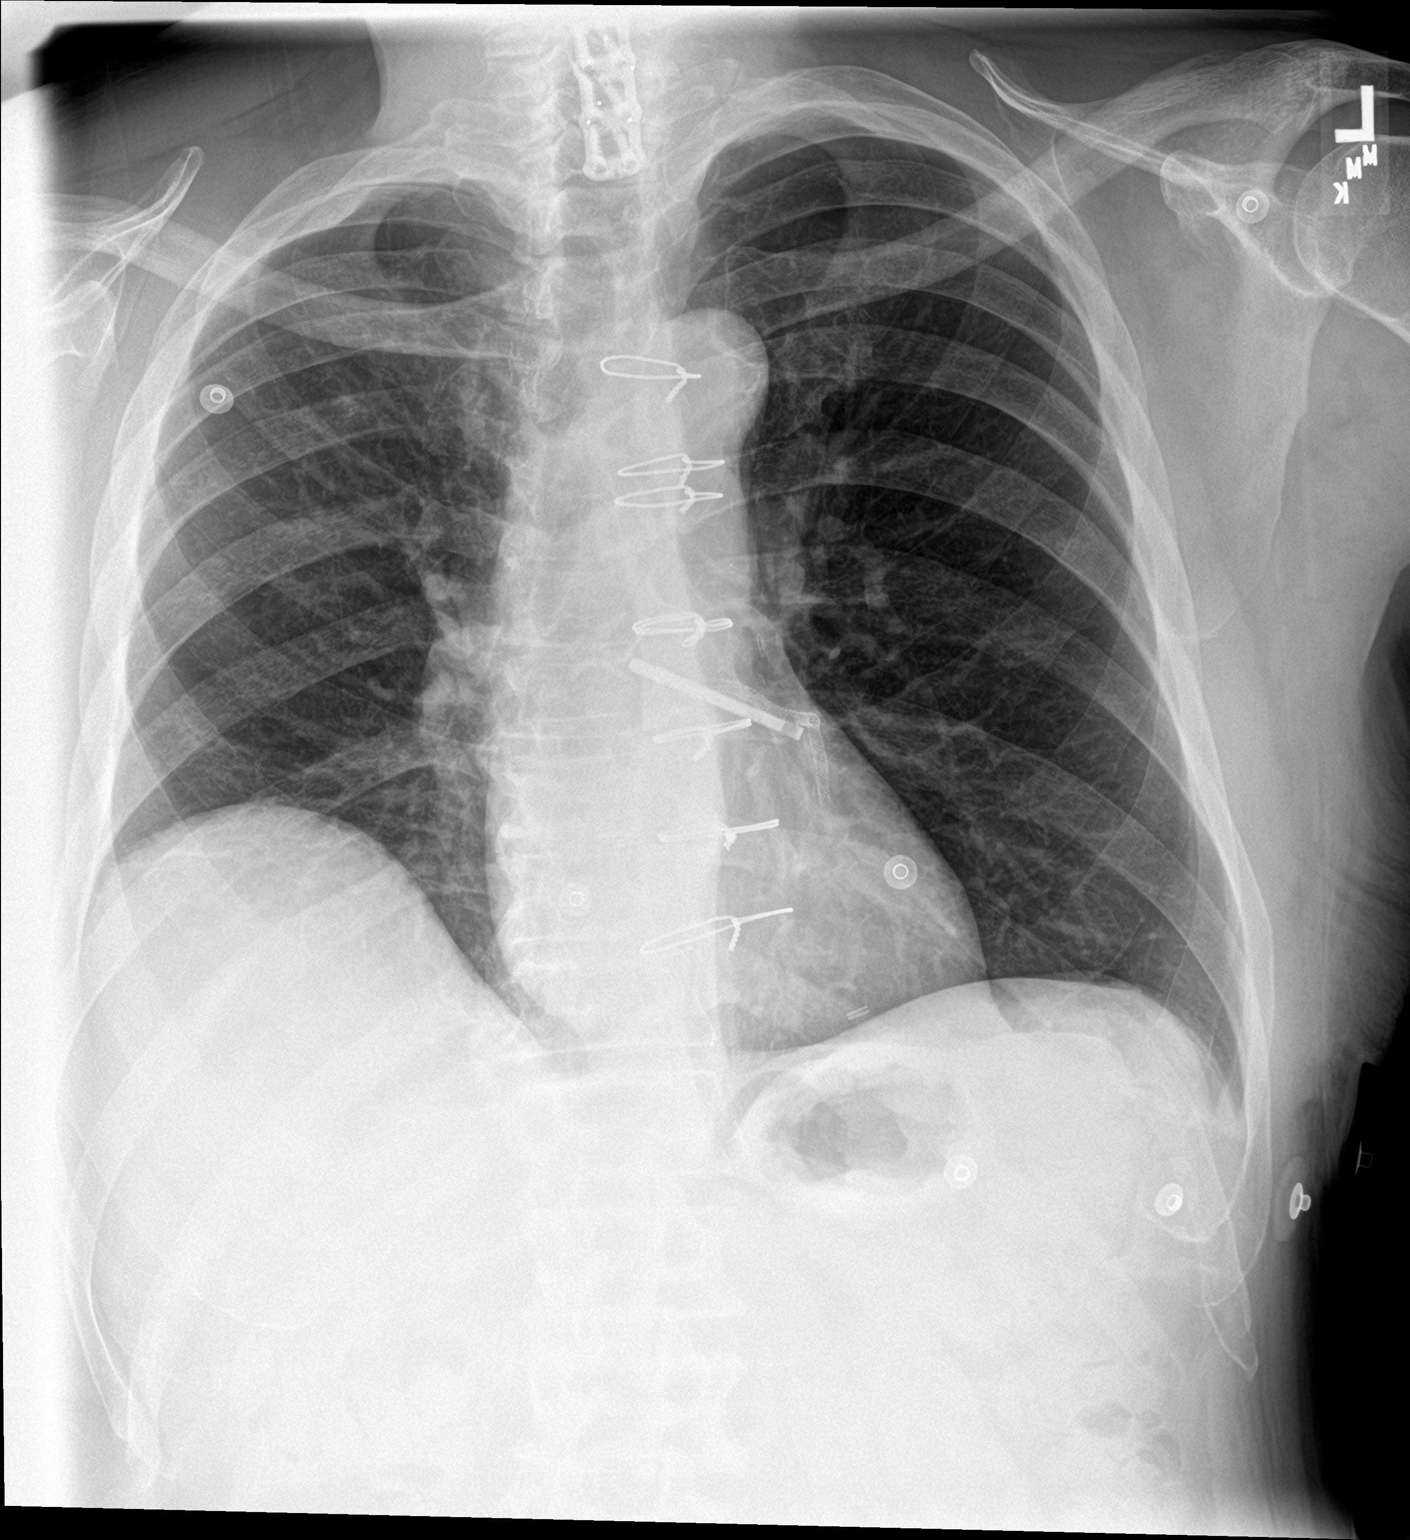

[chest lat]
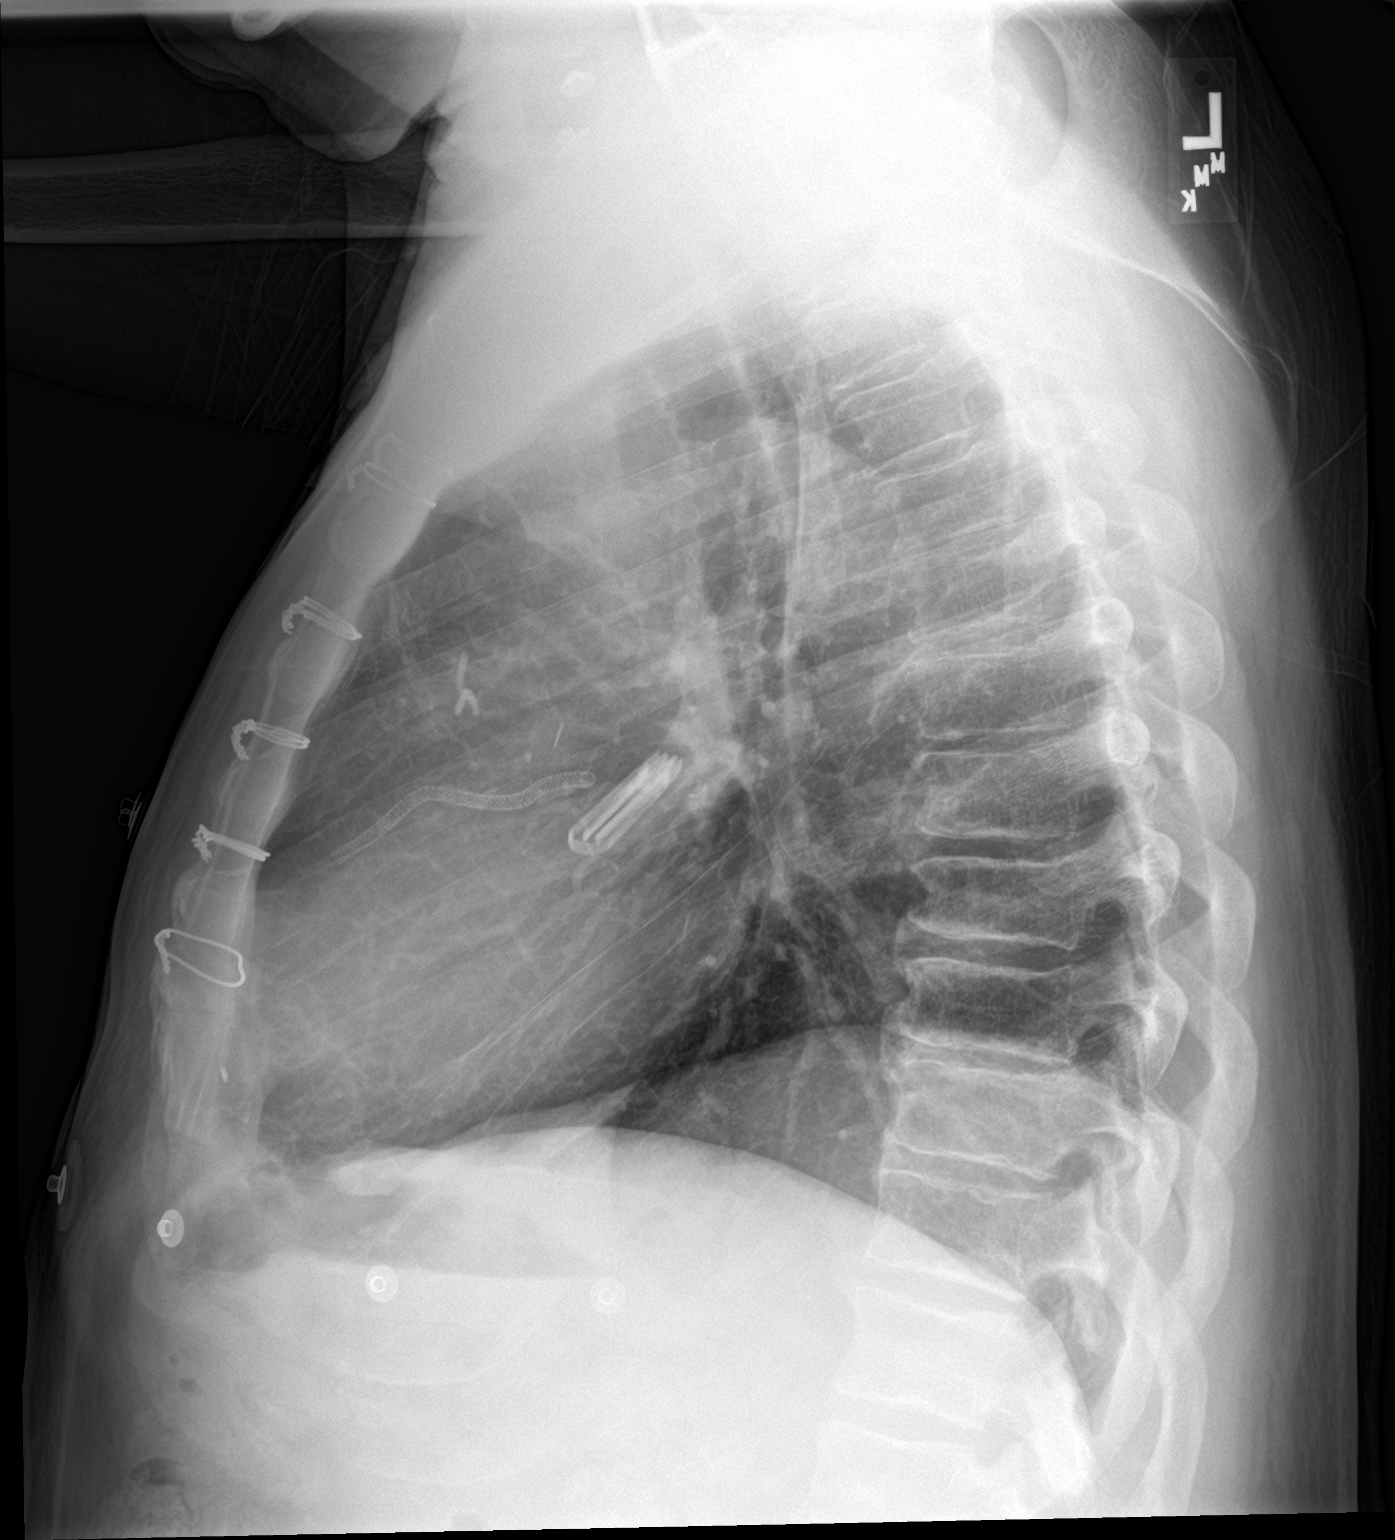

[2 of 2 positions shown; findings below may reference images not displayed]

FINDINGS: The heart size and mediastinal contours are within normal limits.
Median sternotomy wires, vascular stents, and a left atrial
appendage clip are redemonstrated. There is elevation of the right
hemidiaphragm. Both lungs are clear. Degenerative changes are seen
in the spine.
IMPRESSION: No acute cardiopulmonary findings.

## 2020-08-25 IMAGING — MR MR FOOT*R* W/O CM
6 series · 40 of 40 positions shown · non-contrast
Comparison: Plain films right foot 07/16/2019

CLINICAL DATA: Diabetic patient with right second toe pain and
swelling.

EXAM:
MRI OF THE RIGHT FOREFOOT WITHOUT CONTRAST
TECHNIQUE: Multiplanar, multisequence MR imaging of the right forefoot was
performed. No intravenous contrast was administered.

[Series 3: STIR · sagittal · right · 3.0mm · 0.59mm/px · 6 of 32 slices shown]
[im 1/32]
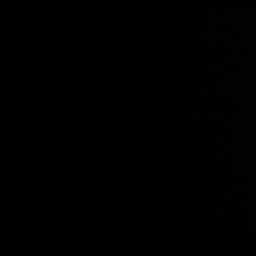
[im 7/32]
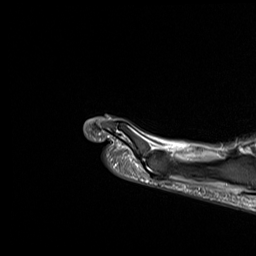
[im 13/32]
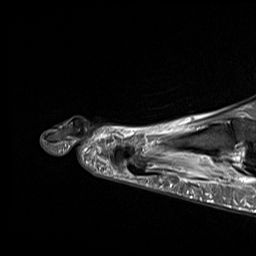
[im 19/32]
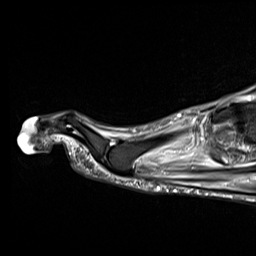
[im 25/32]
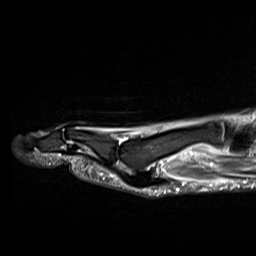
[im 32/32]
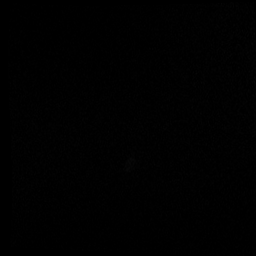

[Series 5: T2 fat-sat · coronal · right · 3.0mm · 0.44mm/px · 8 of 40 slices shown (1 of 3)]
[im 1/40]
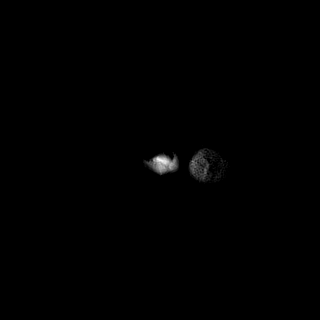
[im 6/40]
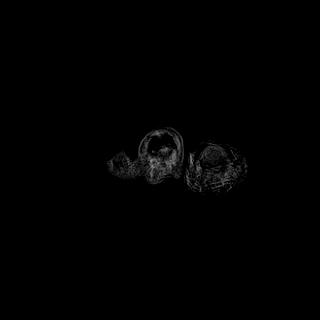
[im 12/40]
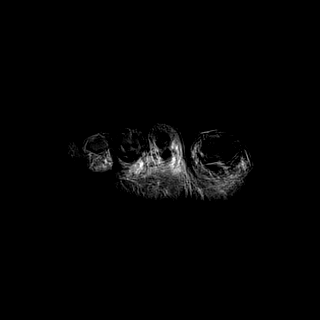
[im 17/40]
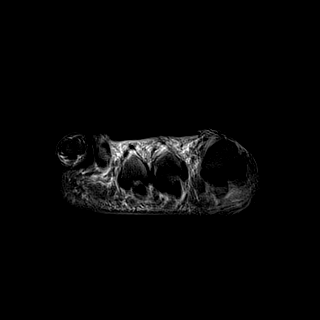
[im 23/40]
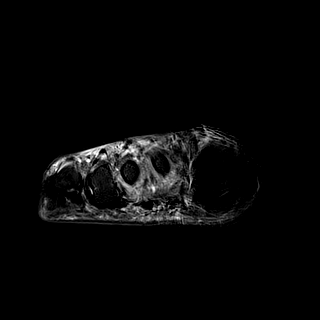
[im 28/40]
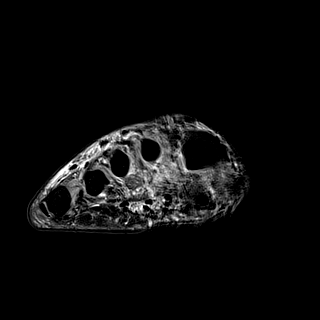
[im 34/40]
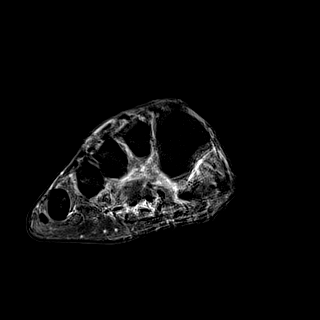
[im 40/40]
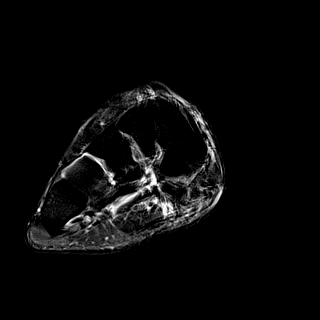

[Series 6: T1 · axial · right · 3.0mm · 0.39mm/px · z∈[-96,-7]mm · 5 of 24 slices shown (1 of 2)]
[im 1/24]
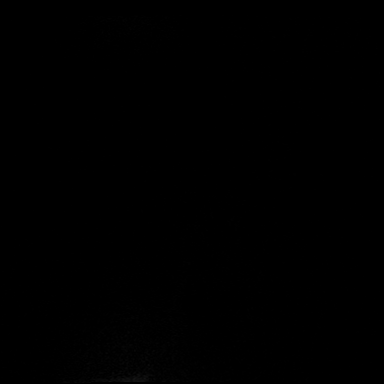
[im 6/24]
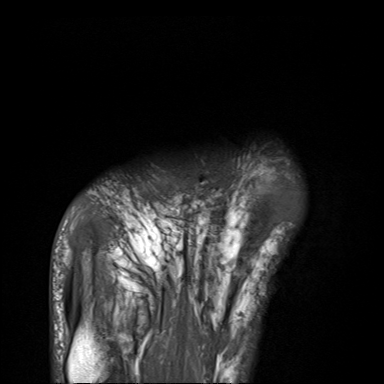
[im 12/24]
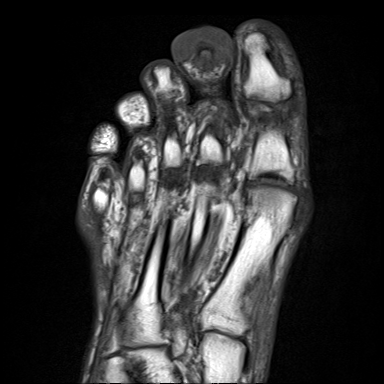
[im 18/24]
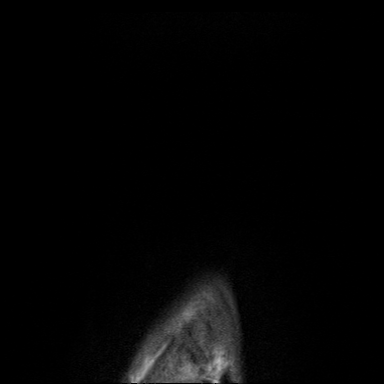
[im 24/24]
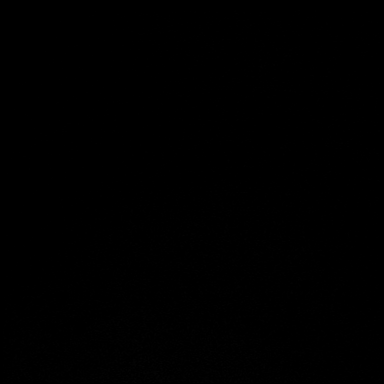

[Series 7: T2 fat-sat · axial · right · 3.0mm · 0.45mm/px · z∈[-96,-7]mm · 5 of 24 slices shown (2 of 3)]
[im 1/24]
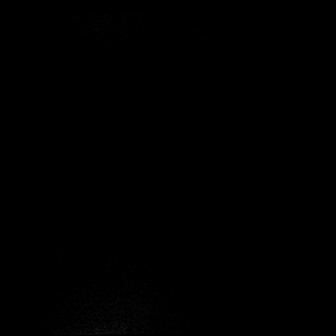
[im 6/24]
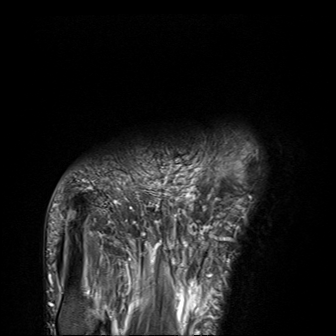
[im 12/24]
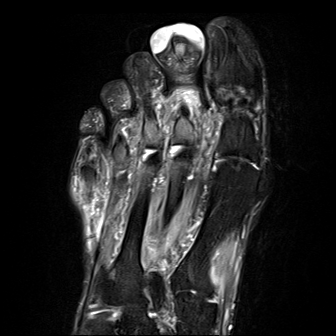
[im 18/24]
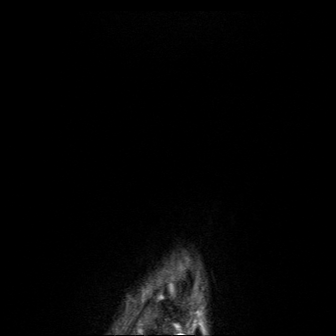
[im 24/24]
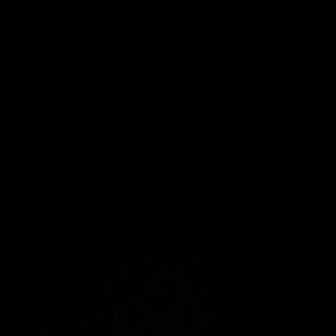

[Series 8: T1 · coronal · right · 3.0mm · 0.36mm/px · 8 of 40 slices shown (2 of 2)]
[im 1/40]
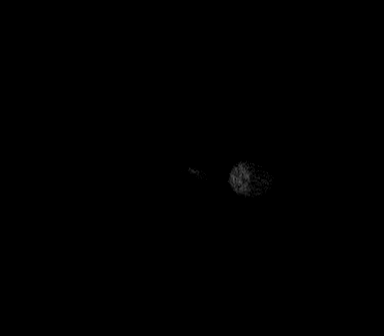
[im 6/40]
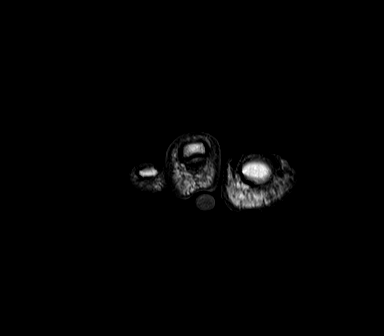
[im 12/40]
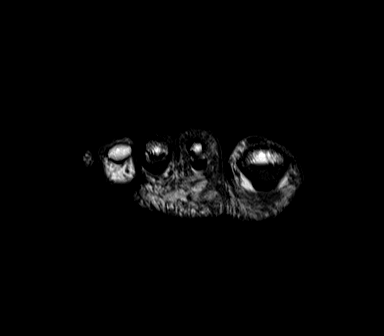
[im 17/40]
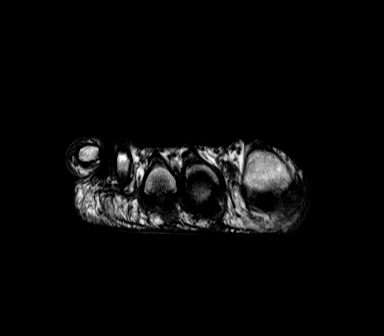
[im 23/40]
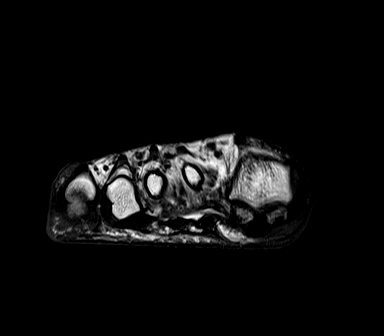
[im 28/40]
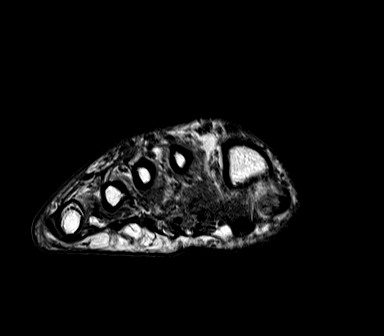
[im 34/40]
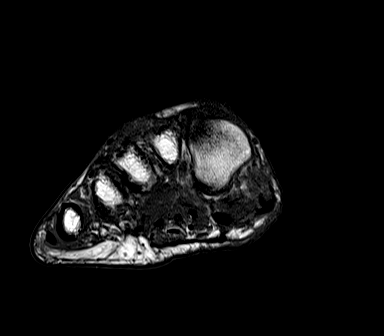
[im 40/40]
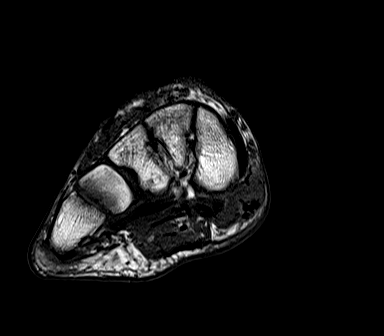

[Series 9: T2 fat-sat · coronal · right · 3.0mm · 0.44mm/px · 8 of 40 slices shown (3 of 3)]
[im 1/40]
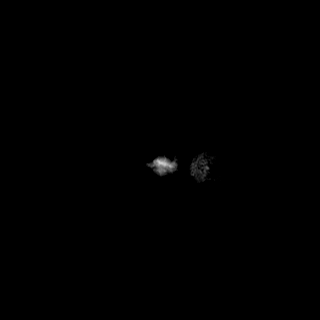
[im 6/40]
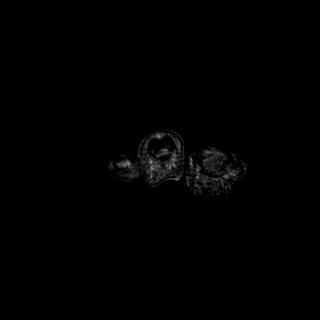
[im 12/40]
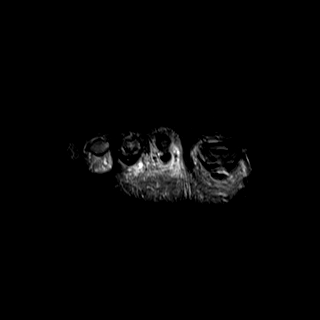
[im 17/40]
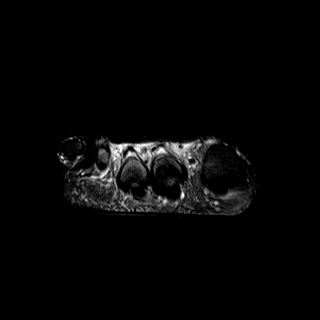
[im 23/40]
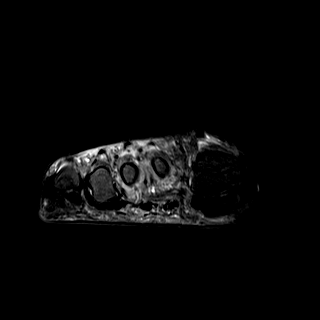
[im 28/40]
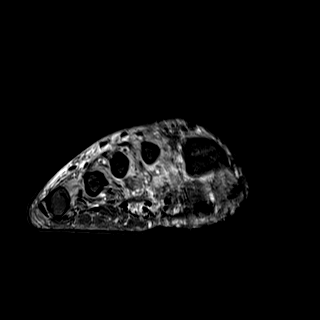
[im 34/40]
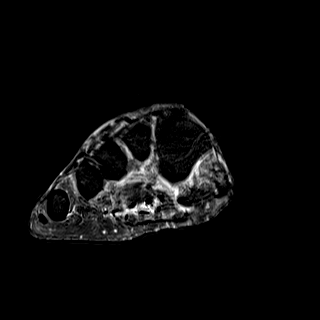
[im 40/40]
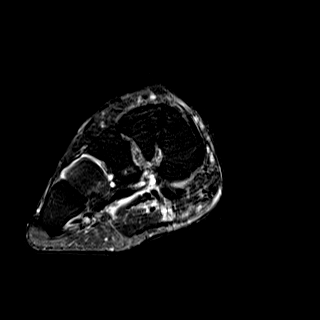

[40 of 40 positions shown; findings below may reference images not displayed]

FINDINGS: Bones/Joint/Cartilage

There is marrow edema throughout the distal phalanx of the second
toe consistent with osteomyelitis. No other evidence of
osteomyelitis is identified. No fracture, stress change or worrisome
lesion. No joint effusion.

Ligaments

Intact.

Muscles and Tendons

No intramuscular fluid collection is seen. The extensor tendon of
the second toe appears torn from the distal phalanx without
retraction.

Soft tissues

A fluid collection in the distal soft tissues of the second toe
surrounding the distal phalanx measures approximately 1.2 cm AP x
2.4 cm transverse x 2.1 cm craniocaudal. There is some subcutaneous
edema over the dorsum of the foot.
IMPRESSION: Findings consistent with osteomyelitis throughout the distal phalanx
of the second toe. Fluid collection surrounding the distal phalanx
is consistent with an abscess or blister.

The extensor tendon of the second toe appears torn from the distal
phalanx without retraction.

## 2020-08-25 IMAGING — NM NM PULMONARY PERF PARTICULATE
8 series · 8 of 8 positions shown · non-contrast
Comparison: Radiograph July 16, 2019.

CLINICAL DATA: Chest pain.

EXAM:
NUCLEAR MEDICINE PERFUSION LUNG SCAN
TECHNIQUE: Perfusion images were obtained in multiple projections after
intravenous injection of radiopharmaceutical.
Ventilation scans intentionally deferred if perfusion scan and chest
x-ray adequate for interpretation during COVID 19 epidemic.
RADIOPHARMACEUTICALS:  1.51 mCi Kc-CCm MAA IV

[Series 1: ant/post perf · 4.14mm/px · 1 of 1 slices shown (1 of 2)]
[im 1/1]
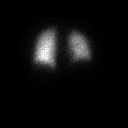

[Series 1: ant/post perf · 4.14mm/px · 1 of 1 slices shown (2 of 2)]
[im 1/1]
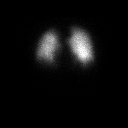

[Series 2: lao/rpo perf · 4.14mm/px · 1 of 1 slices shown (1 of 2)]
[im 1/1]
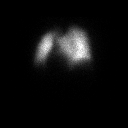

[Series 2: lao/rpo perf · 4.14mm/px · 1 of 1 slices shown (2 of 2)]
[im 1/1]
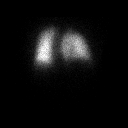

[Series 3: lpo/rao perf · 4.14mm/px · 1 of 1 slices shown (1 of 2)]
[im 1/1]
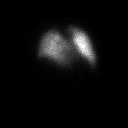

[Series 3: lpo/rao perf · 4.14mm/px · 1 of 1 slices shown (2 of 2)]
[im 1/1]
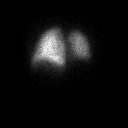

[Series 4: lt lat/rt lat perf · 4.14mm/px · 1 of 1 slices shown (1 of 2)]
[im 1/1]
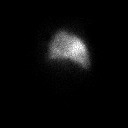

[Series 4: lt lat/rt lat perf · 4.14mm/px · 1 of 1 slices shown (2 of 2)]
[im 1/1]
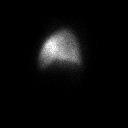

[8 of 8 positions shown; findings below may reference images not displayed]

FINDINGS: No definite defect is noted on the perfusion study. Ventilation
study is not necessary.
IMPRESSION: No definite perfusion defect noted. No definite evidence of
pulmonary embolus.

## 2020-09-19 ENCOUNTER — Inpatient Hospital Stay: Payer: Medicare Other | Admitting: Infectious Diseases

## 2020-09-19 IMAGING — DX DG CHEST 2V
2 series · 2 of 2 positions shown · non-contrast
Comparison: 07/16/2019

CLINICAL DATA: Chest pain and shortness-of-breath.

EXAM:
CHEST - 2 VIEW

[chest pa]
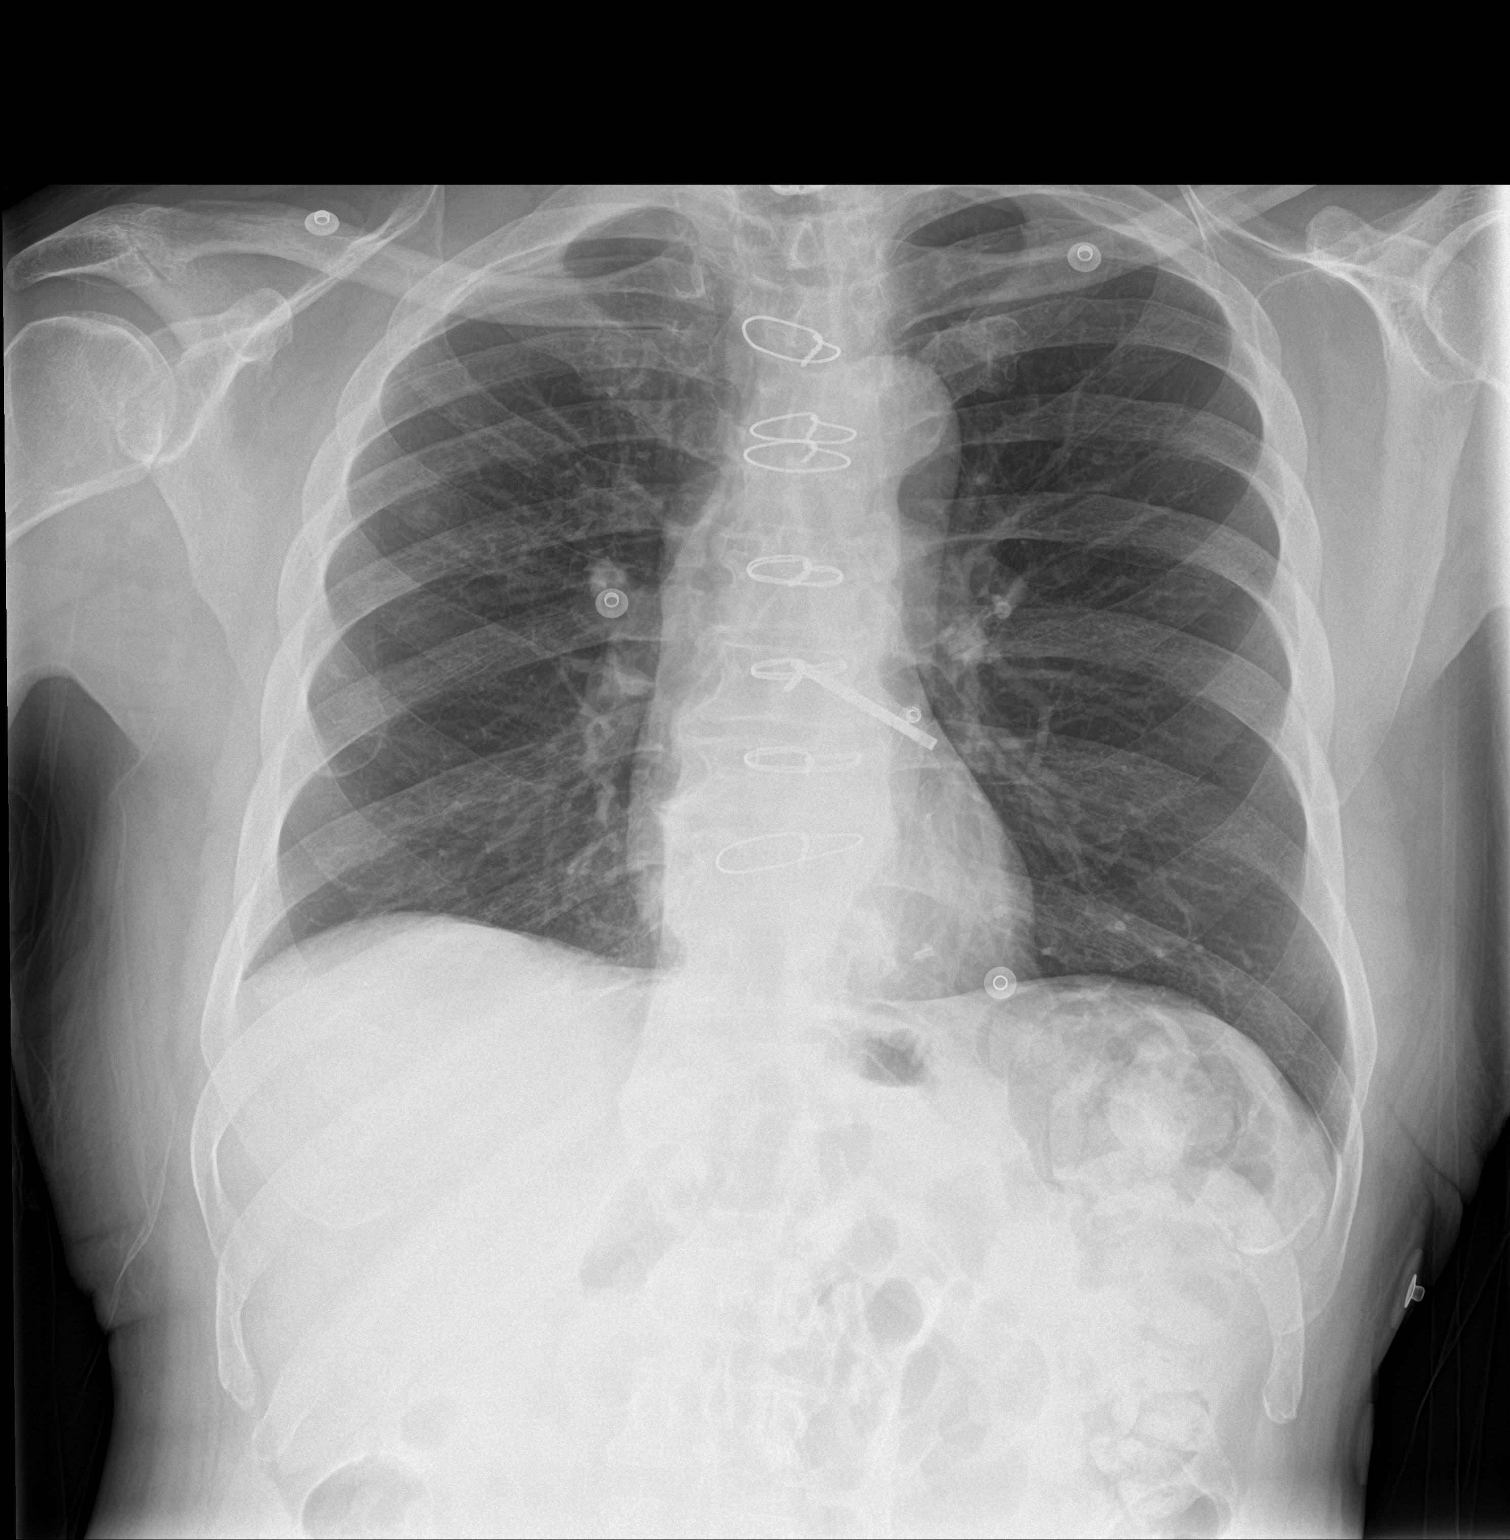

[chest lat]
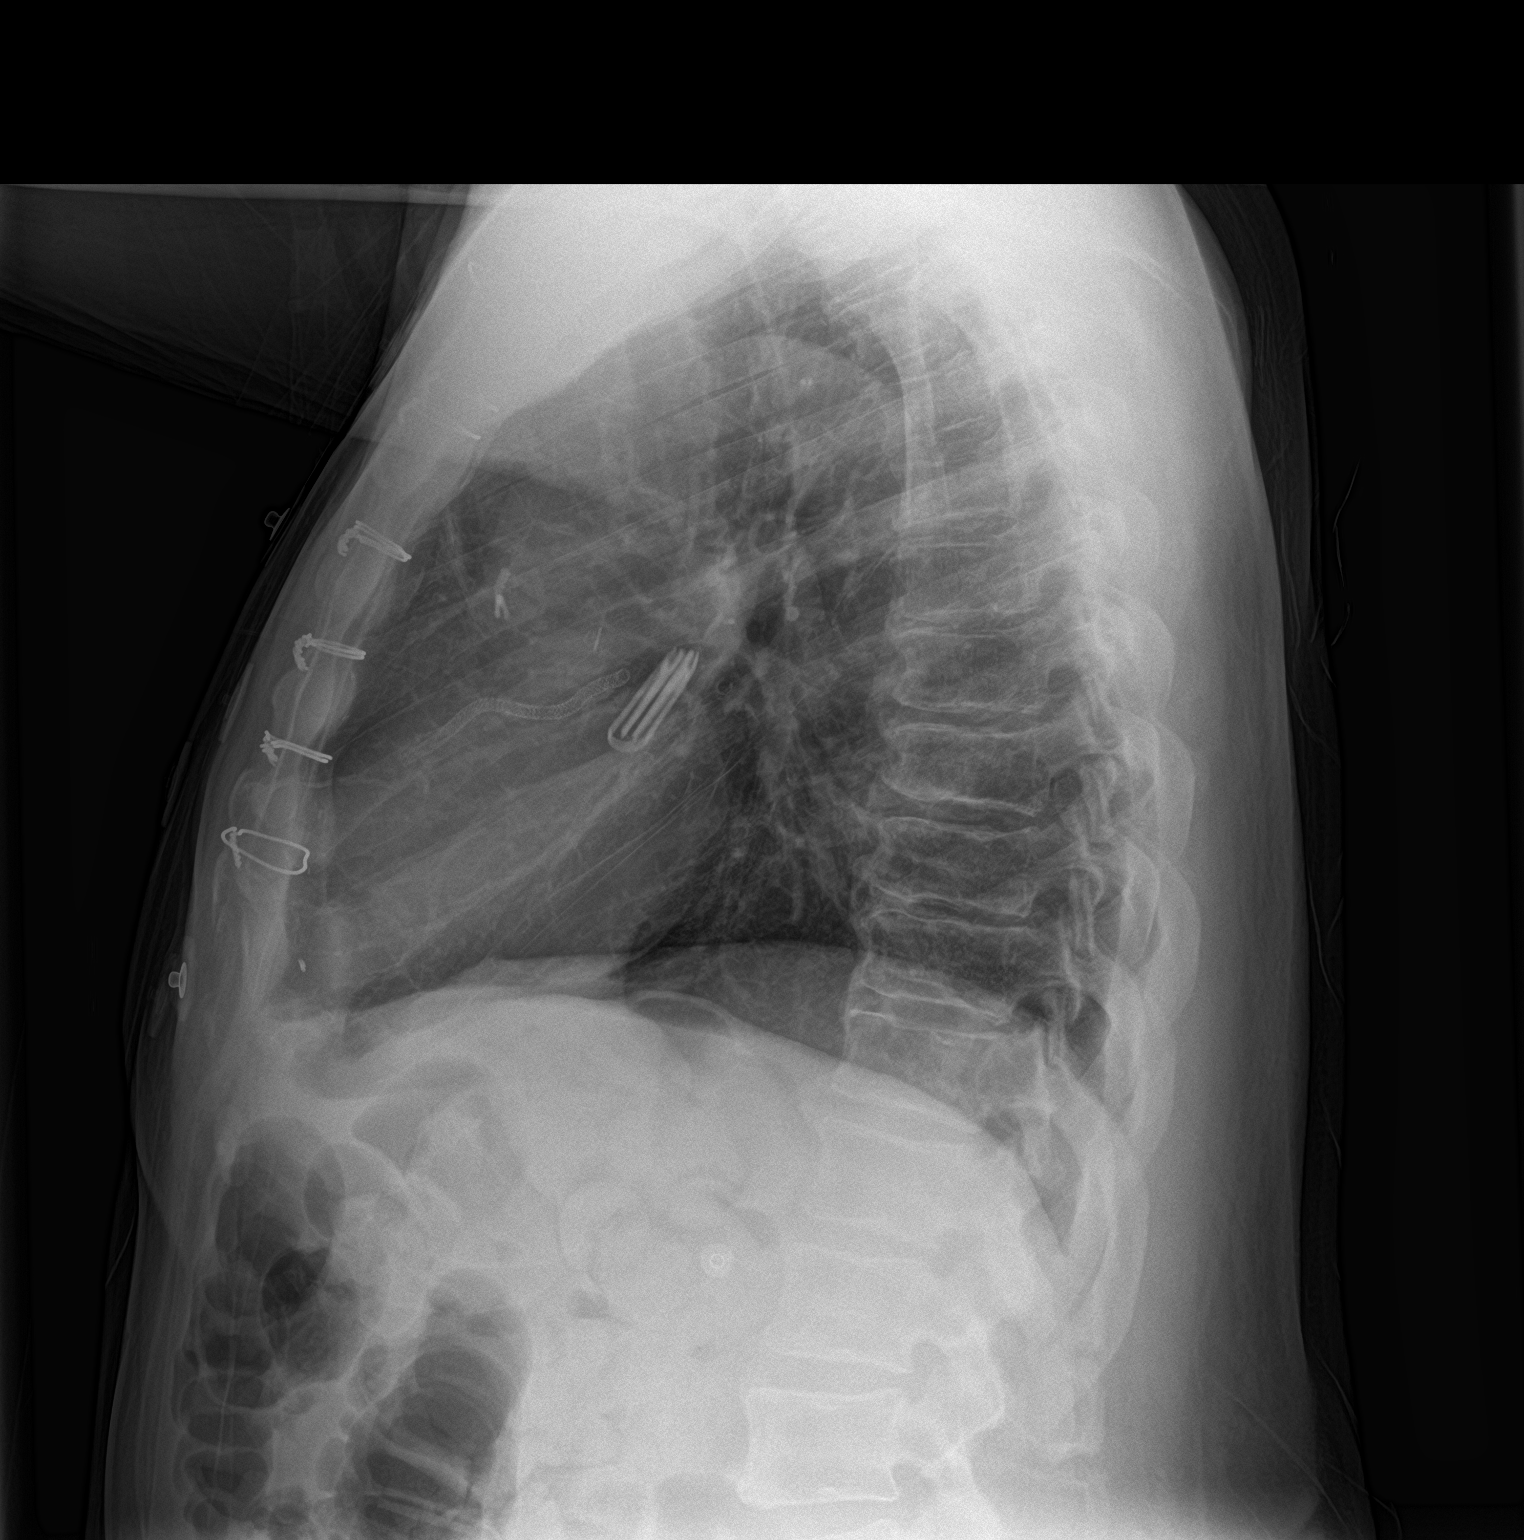

[2 of 2 positions shown; findings below may reference images not displayed]

FINDINGS: Lungs are adequately inflated and otherwise clear. Cardiomediastinal
silhouette and remainder of the exam is unchanged.
IMPRESSION: No active cardiopulmonary disease.

## 2020-09-26 IMAGING — DX DG CHEST 2V
2 series · 2 of 2 positions shown · non-contrast
Comparison: 08/11/2019

CLINICAL DATA: Chest pain and shortness of breath

EXAM:
CHEST - 2 VIEW

[chest pa]
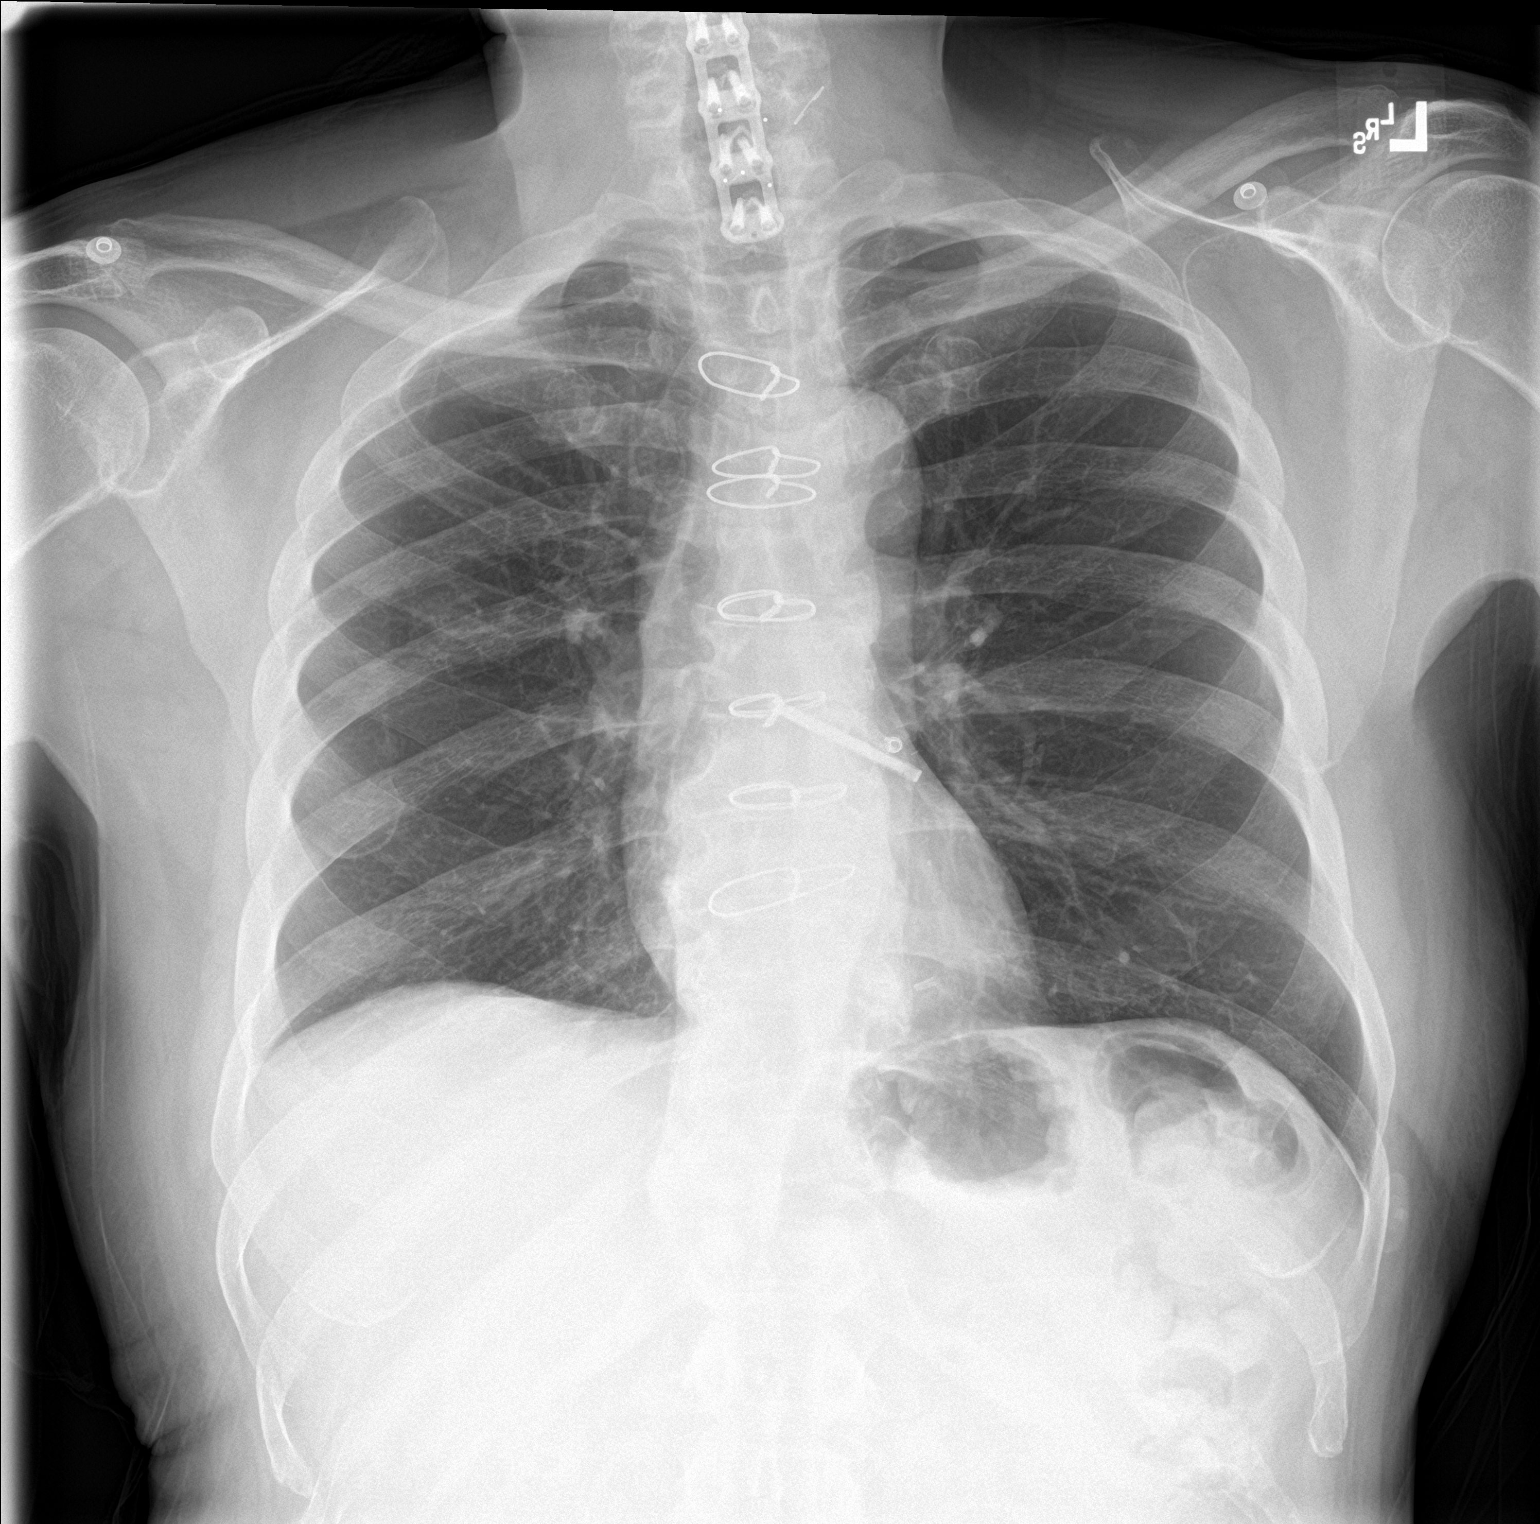

[chest lat]
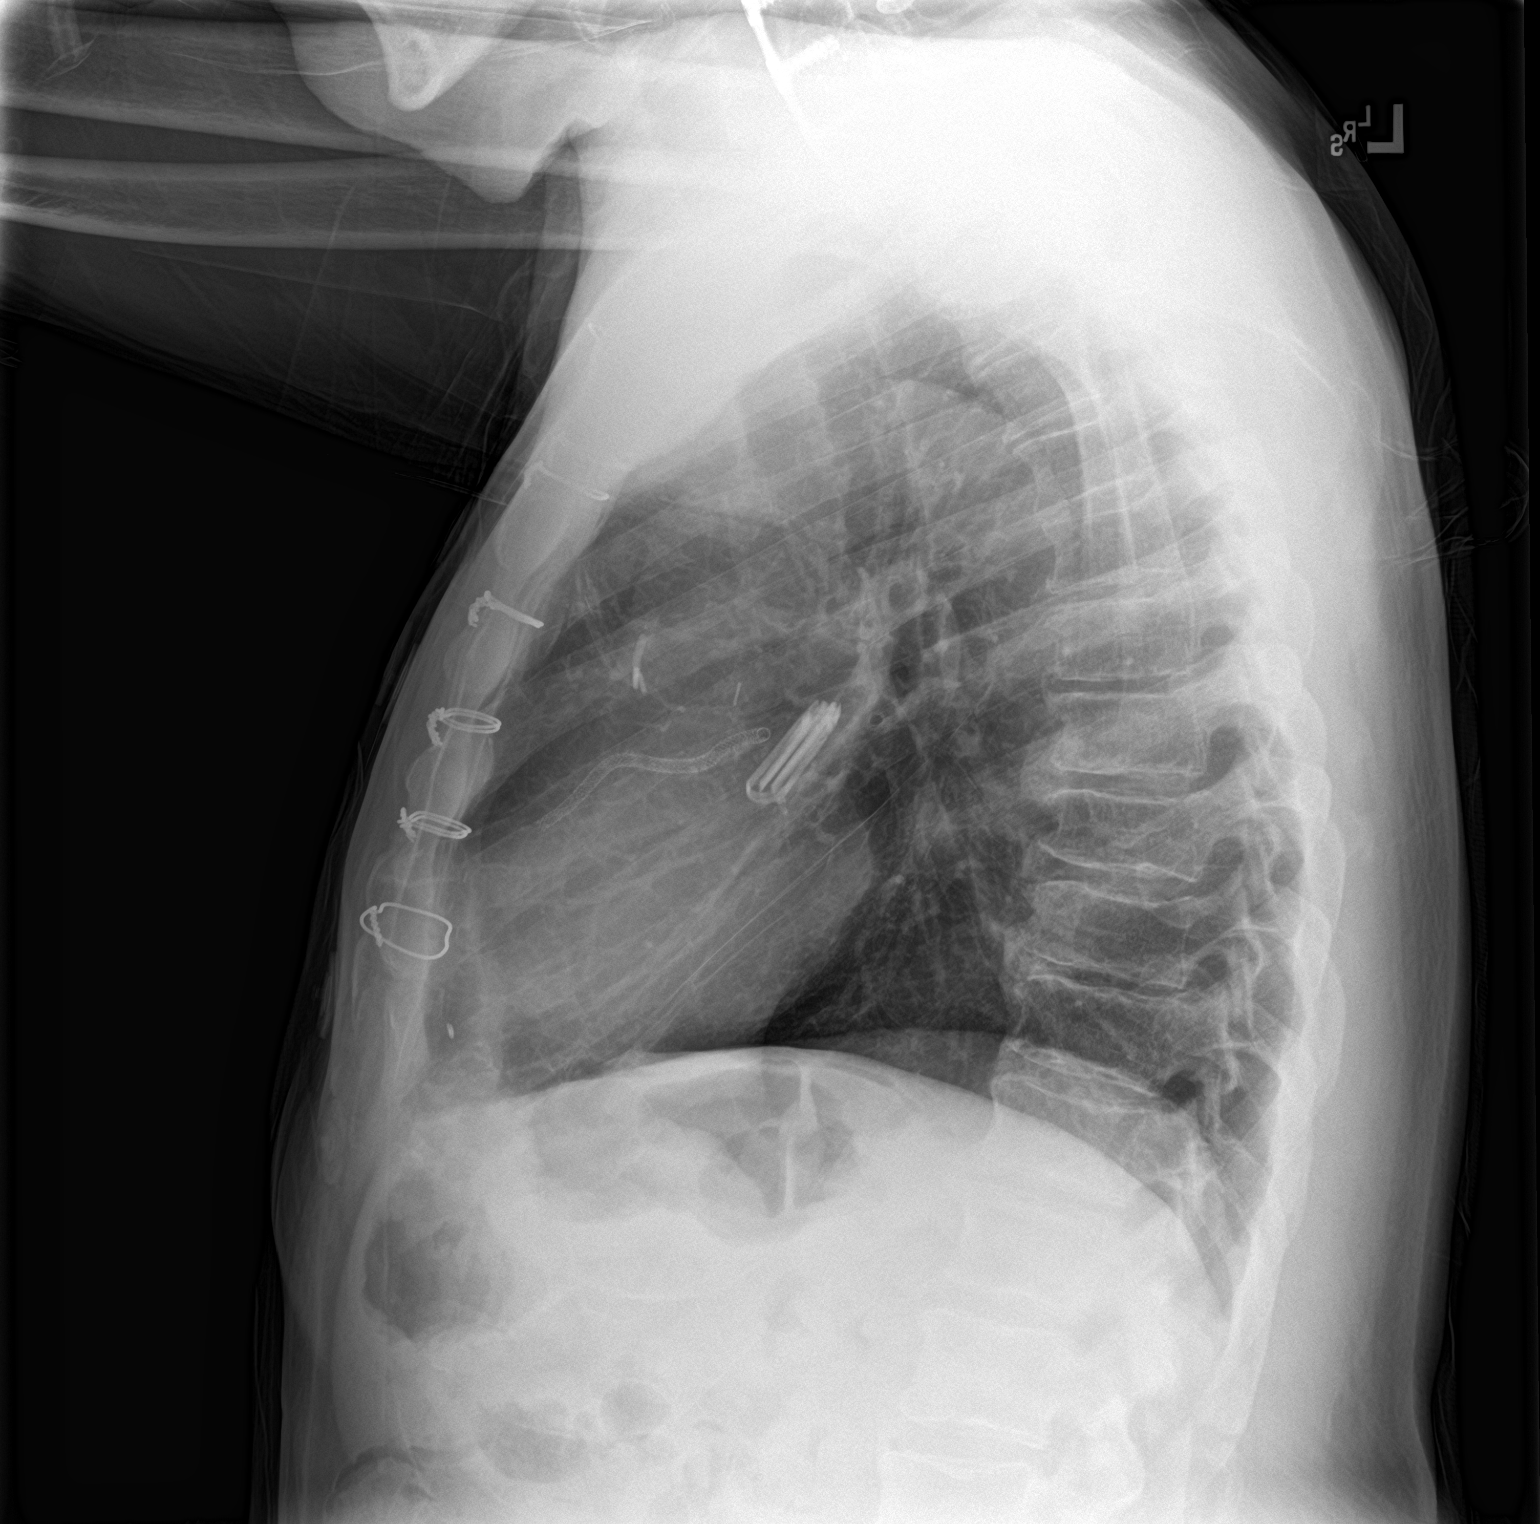

[2 of 2 positions shown; findings below may reference images not displayed]

FINDINGS: Cardiac shadows within normal limits. Postsurgical changes are again
seen. Coronary stents are seen. The lungs are well aerated
bilaterally. No focal infiltrate or sizable effusion is noted. No
acute bony abnormality is seen. Postsurgical changes in the cervical
spine are seen.
IMPRESSION: No acute abnormality noted.

## 2020-10-11 IMAGING — DX DG CHEST 1V PORT
1 series · 1 of 1 positions shown · non-contrast
Comparison: 08/18/2019

CLINICAL DATA: Chest pain

EXAM:
PORTABLE CHEST 1 VIEW

[chest ap]
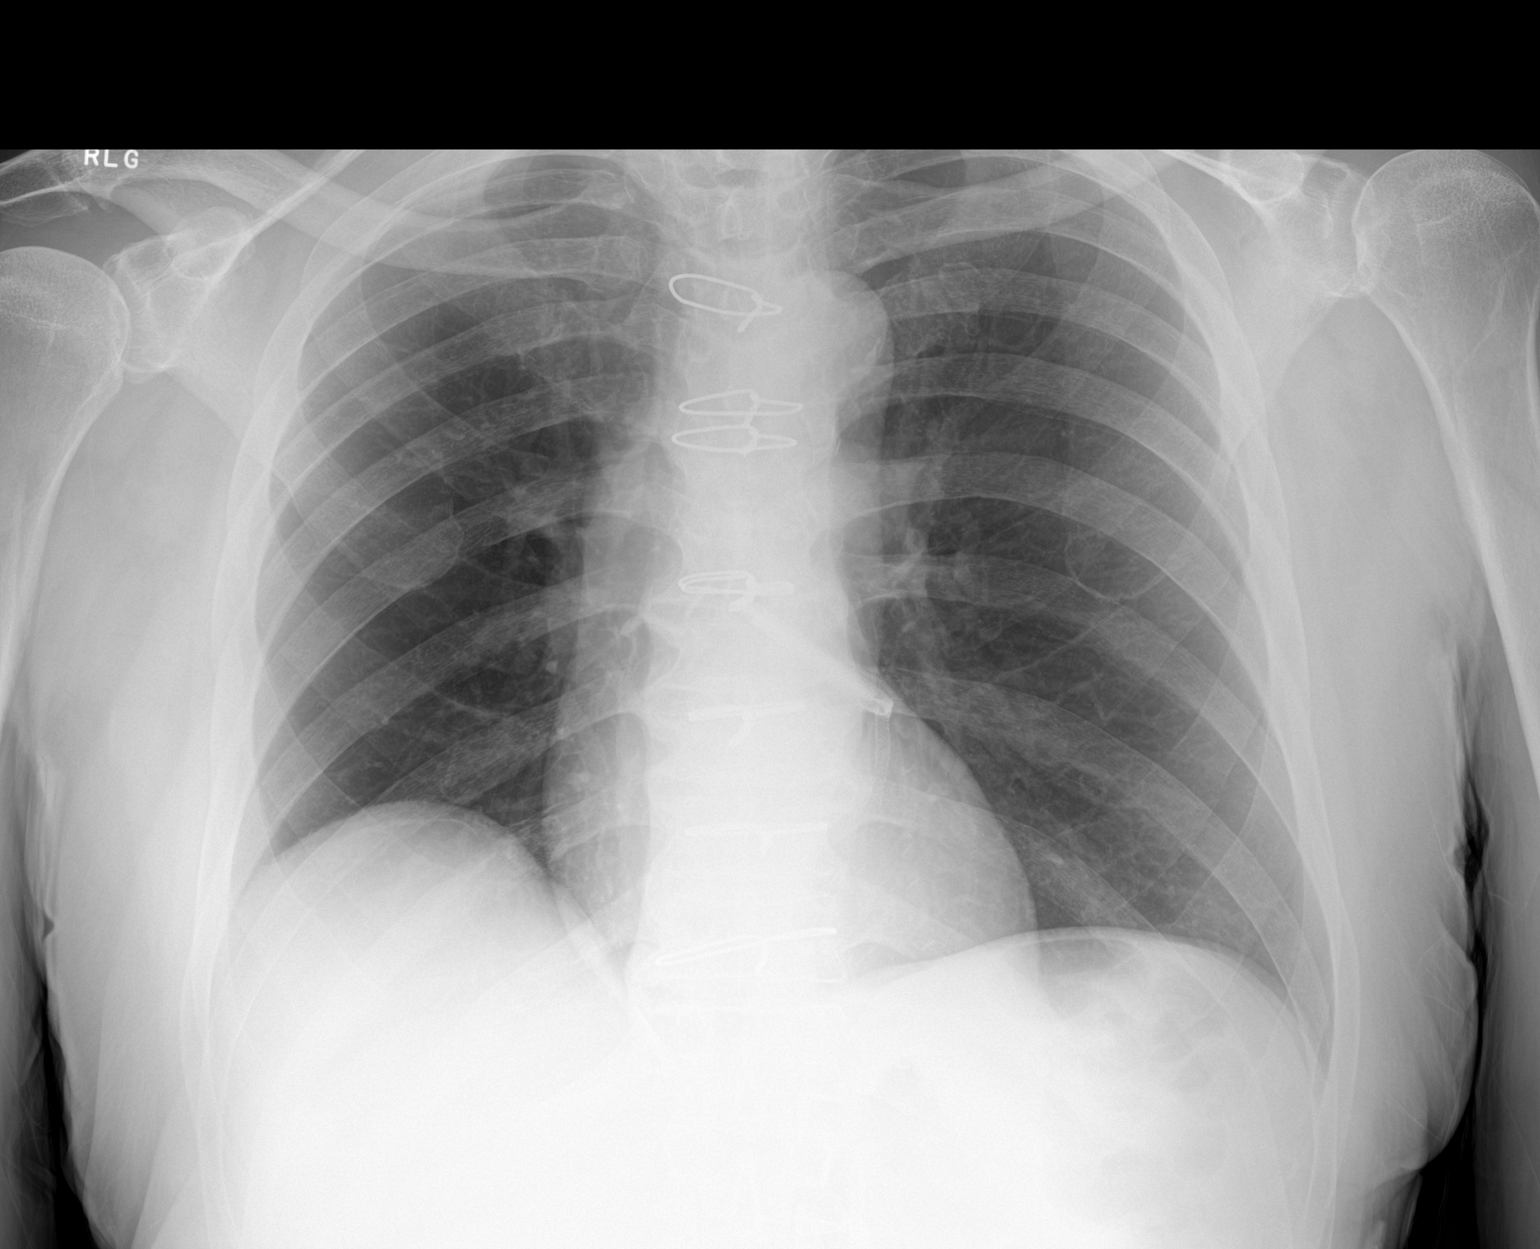

[1 of 1 positions shown; findings below may reference images not displayed]

FINDINGS: Lungs are clear.  No pleural effusion or pneumothorax.

The heart is normal in size. Postsurgical changes related to prior
CABG.

Median sternotomy. Cervical spine fixation hardware, incompletely
visualized.
IMPRESSION: No evidence of acute cardiopulmonary disease.

## 2020-10-16 ENCOUNTER — Telehealth: Payer: Self-pay

## 2020-10-16 NOTE — Telephone Encounter (Signed)
Received call from Clair Gulling at [facility], patient is currently admitted there. Clair Gulling is wondering if we are still refilling the patient's Michael [name] as his viral load is undetectable. RN relayed that the last refill request we got for the patient was in July of 2021 and that it was denied. Clair Gulling will call [facility] to follow up.  Beryle Flock, RN

## 2020-10-16 NOTE — Telephone Encounter (Signed)
Received call from Dr. Lacinda Axon at [facility] wondering if we have been refilling patient's Odefsey. RN relayed that we have not been refilling and have not seen the patient in quite some time. No further questions.   Beryle Flock, RN

## 2020-12-14 DIAGNOSIS — D638 Anemia in other chronic diseases classified elsewhere: Secondary | ICD-10-CM | POA: Insufficient documentation

## 2021-01-15 IMAGING — DX DG CHEST 2V
2 series · 2 of 2 positions shown · non-contrast
Comparison: 08/11/2019

CLINICAL DATA: Chest pain

EXAM:
CHEST - 2 VIEW

[chest lat]
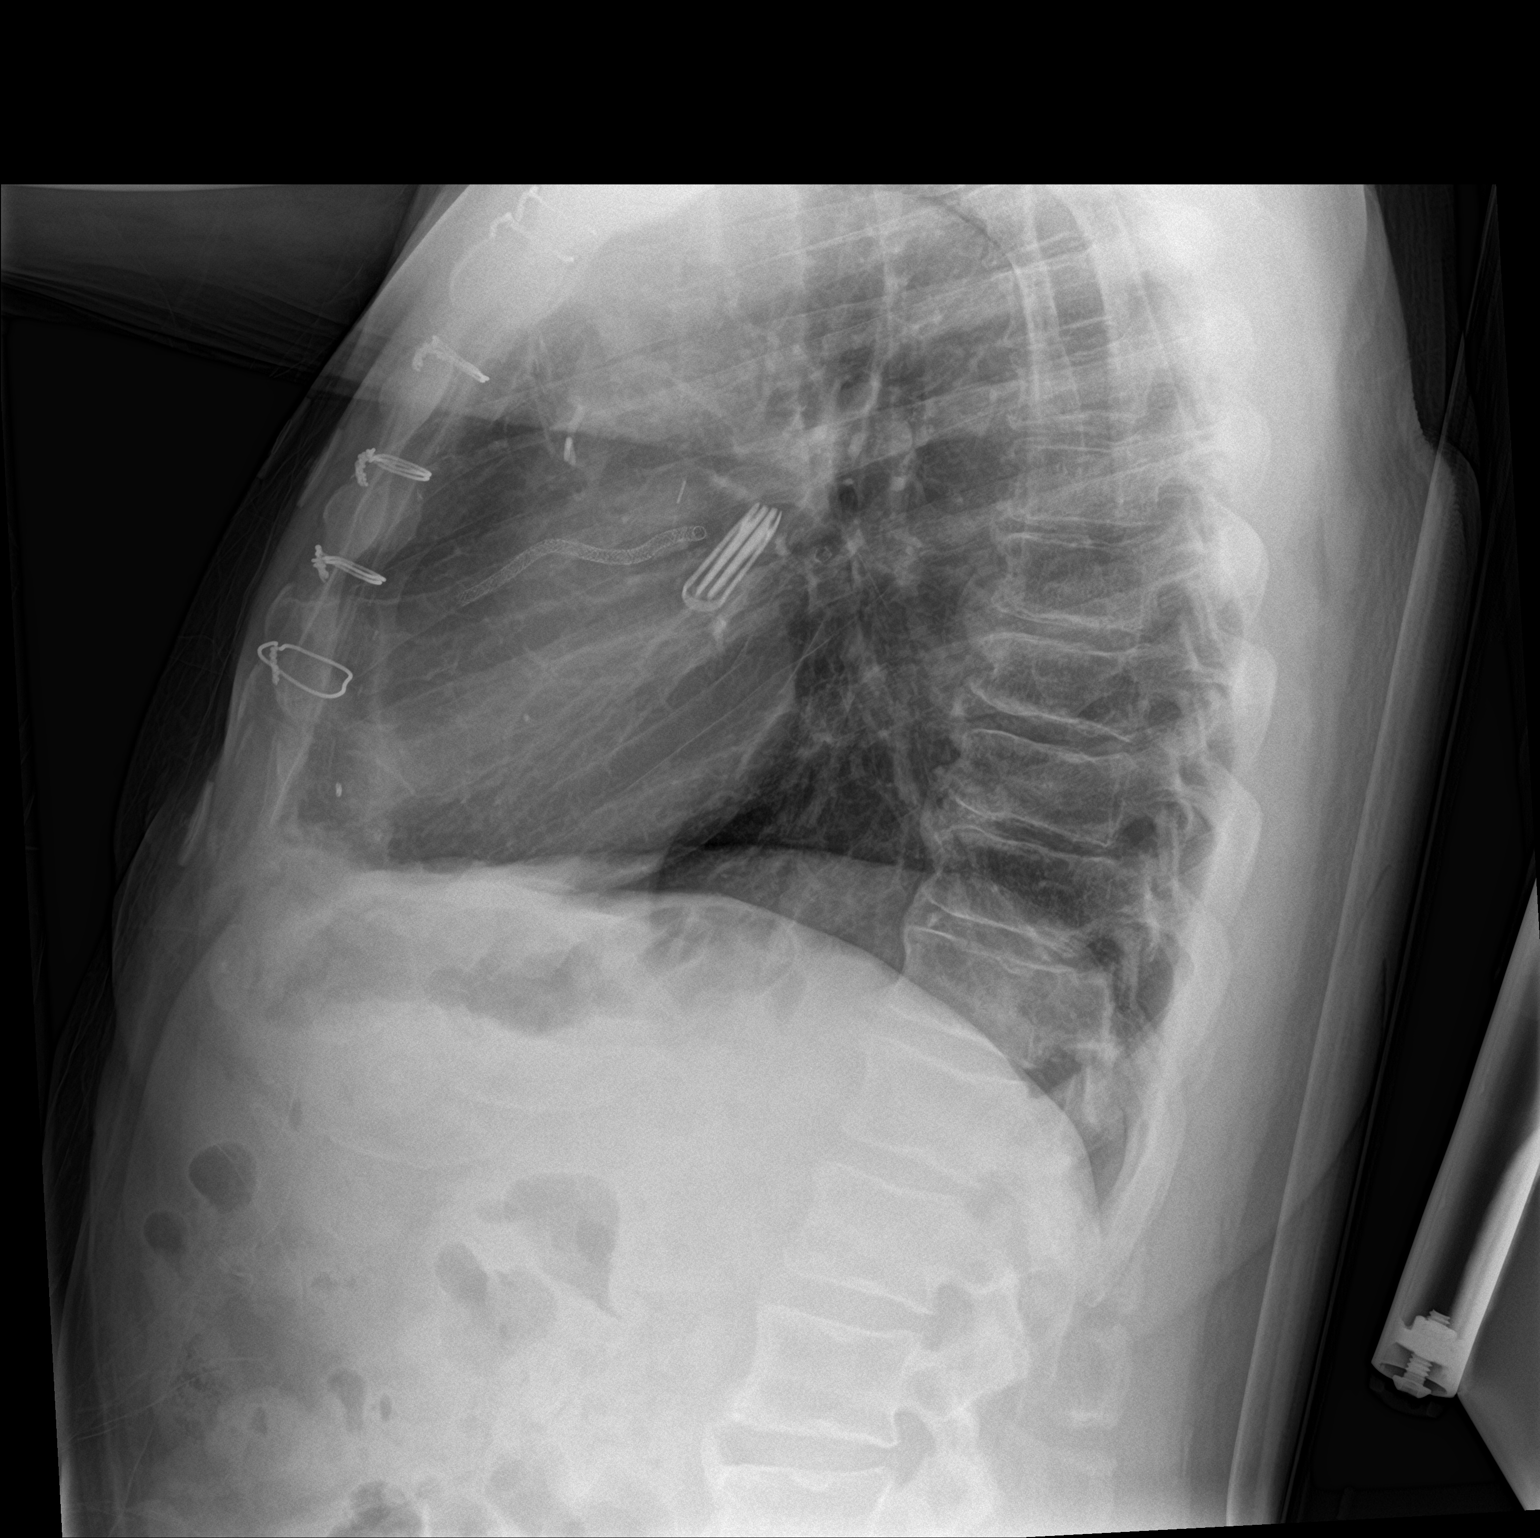

[chest ap]
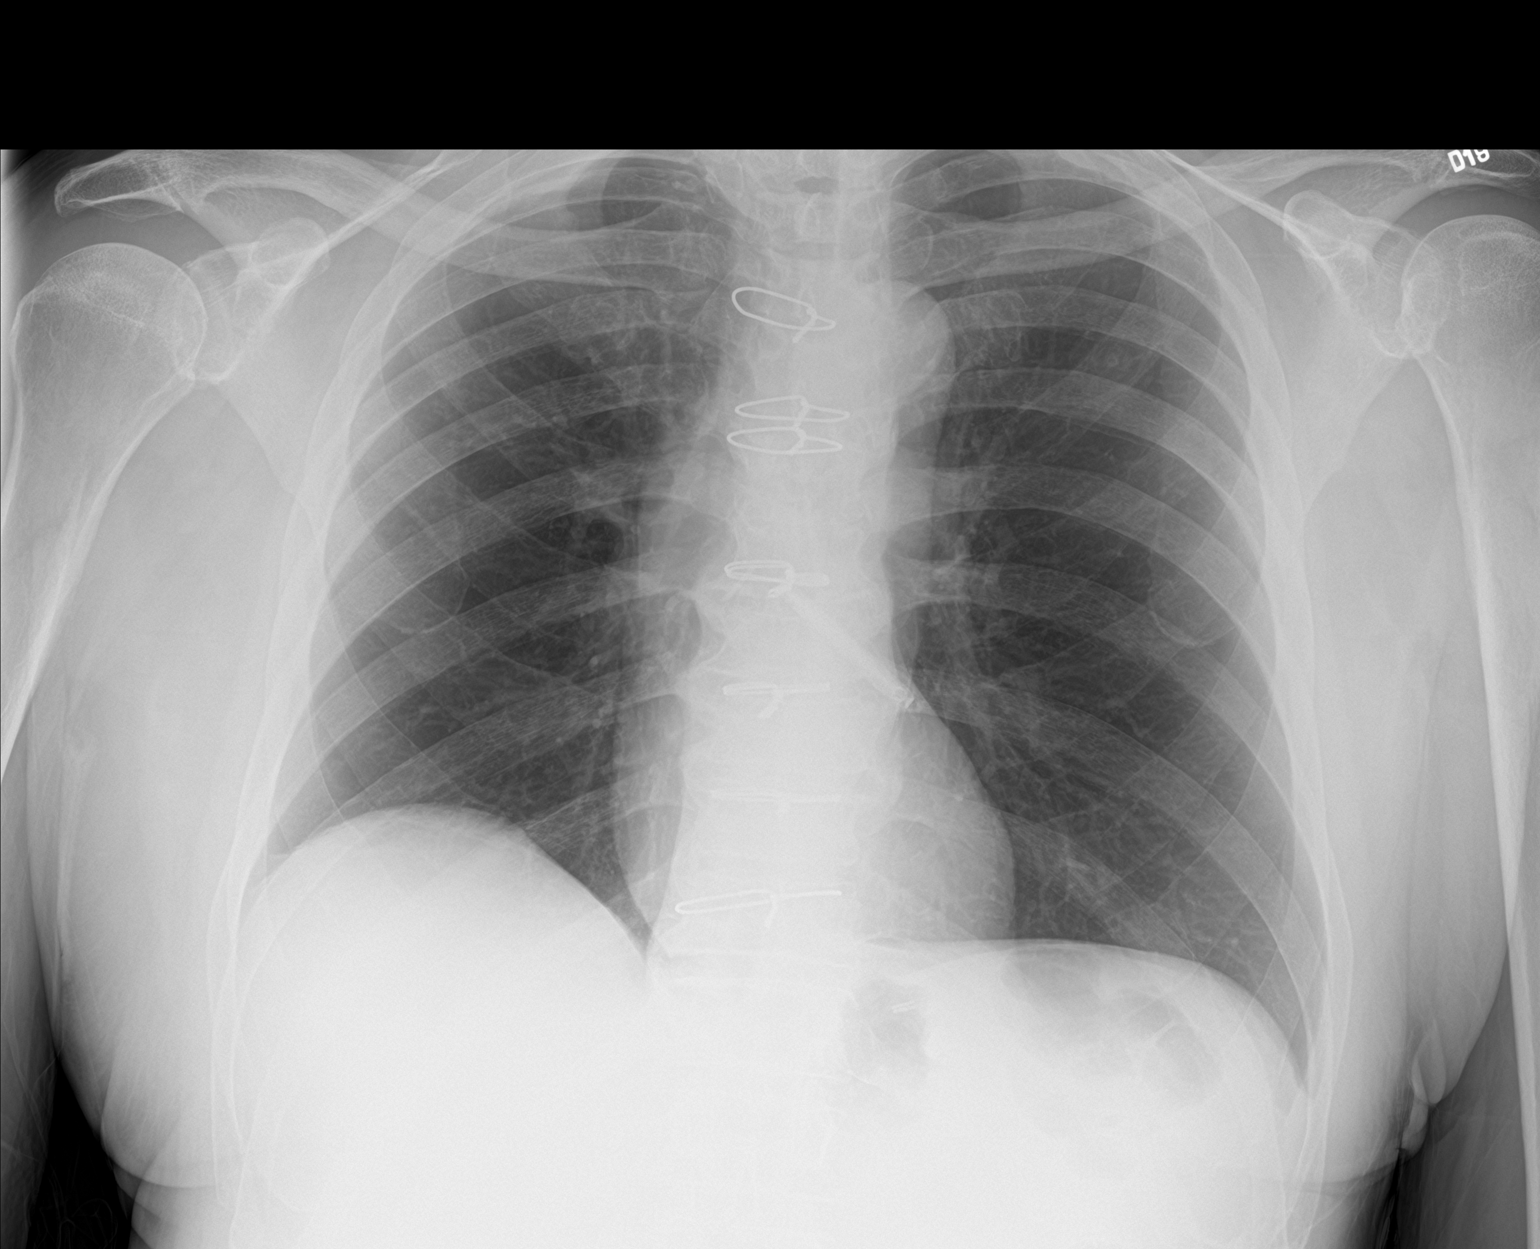

[2 of 2 positions shown; findings below may reference images not displayed]

FINDINGS: The heart size and mediastinal contours are within normal limits.
Both lungs are clear. No pleural effusion or pneumothorax. No acute
osseous abnormality.
IMPRESSION: No acute process in the chest.

## 2021-01-20 IMAGING — DX DG CHEST 1V PORT
1 series · 1 of 1 positions shown · non-contrast
Comparison: December 07, 2019

CLINICAL DATA: Chest pain

EXAM:
PORTABLE CHEST 1 VIEW

[chest ap]
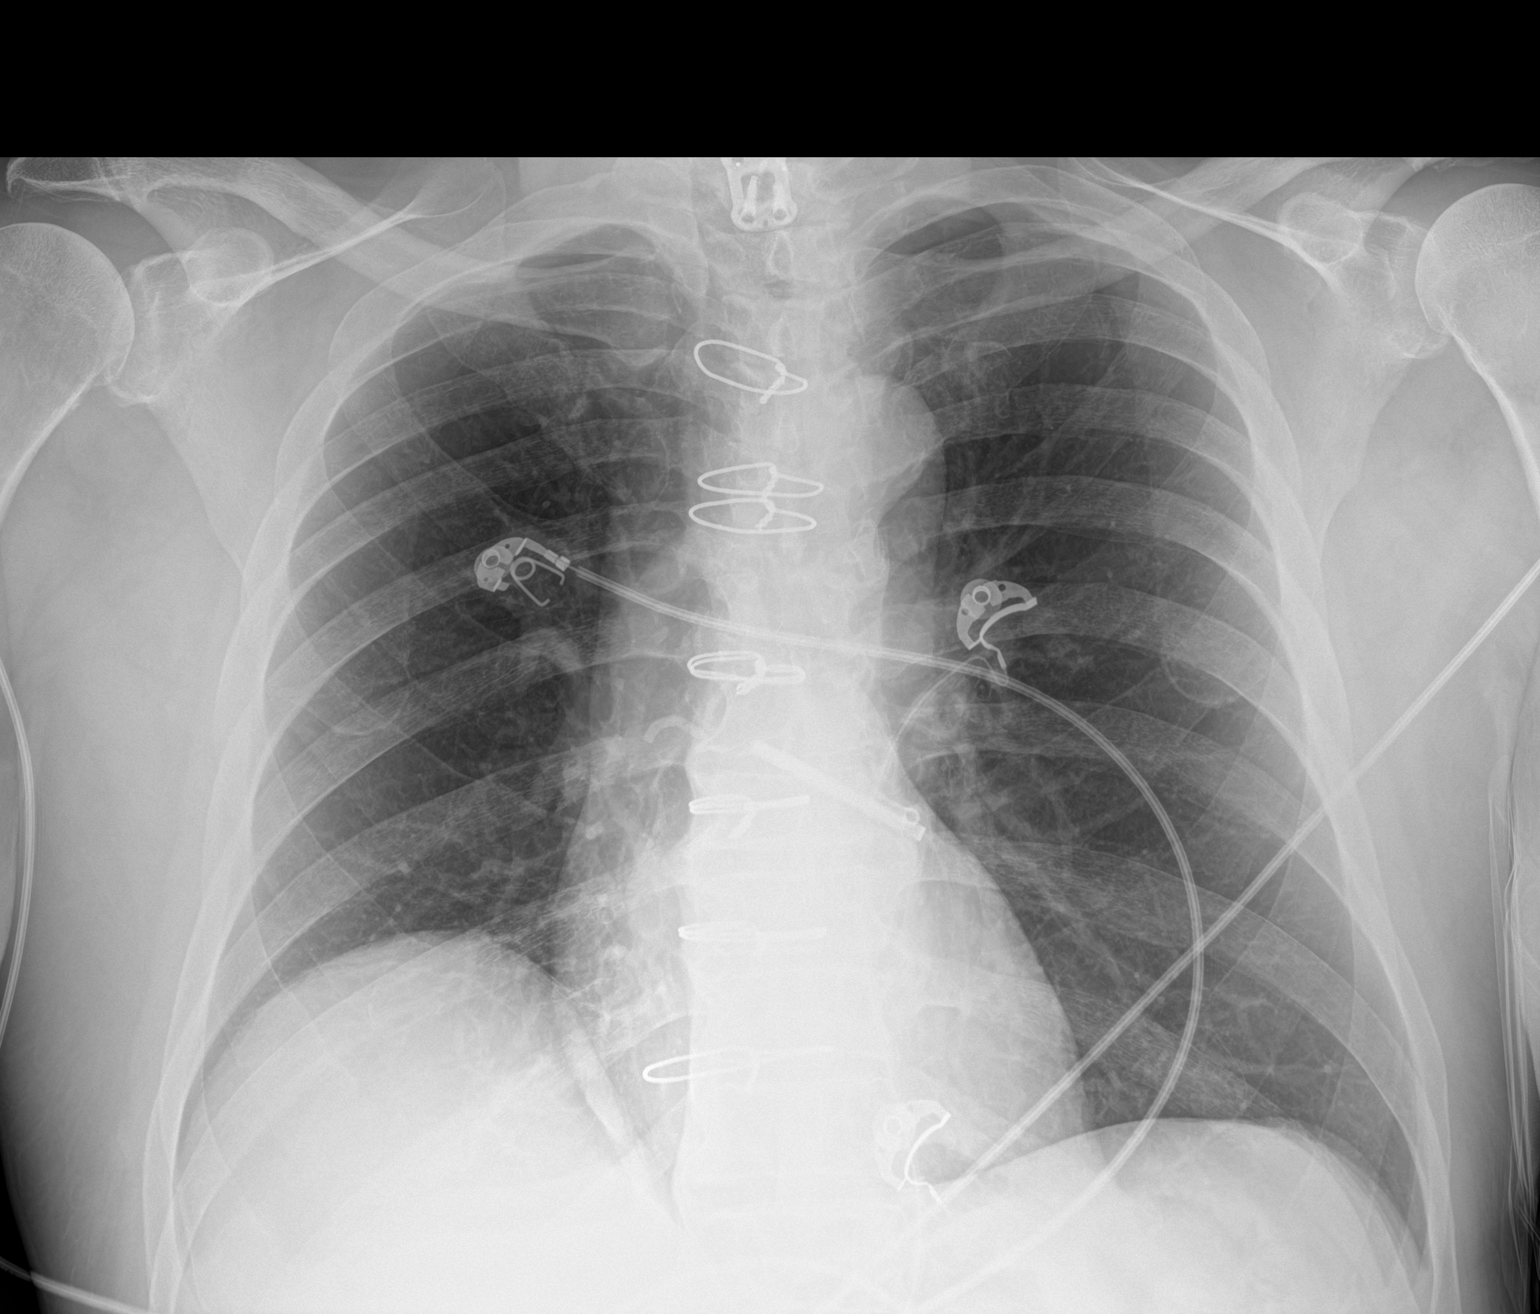

[1 of 1 positions shown; findings below may reference images not displayed]

FINDINGS: The heart size and mediastinal contours are within normal limits.
Both lungs are clear. The visualized skeletal structures are
unremarkable. An atrial appendage clip is noted. The patient is
status post prior median sternotomy. The patient is status post
prior ACDF of the lower cervical spine.
IMPRESSION: No active disease.

## 2021-02-19 ENCOUNTER — Telehealth: Payer: Self-pay

## 2021-02-19 NOTE — Telephone Encounter (Signed)
Spoke to [place] with [facility] and advised that we still have not seen or spoken with patient for some time. He had no further questions as he is tracking him down to continue care and see who has filled his medications.

## 2021-04-03 ENCOUNTER — Observation Stay (HOSPITAL_COMMUNITY)
Admission: EM | Admit: 2021-04-03 | Discharge: 2021-04-04 | Disposition: A | Discharge status: Home / Self Care | Payer: Medicare Other | Attending: Internal Medicine | Admitting: Internal Medicine

## 2021-04-03 ENCOUNTER — Emergency Department (HOSPITAL_COMMUNITY): Payer: Medicare Other

## 2021-04-03 ENCOUNTER — Encounter (HOSPITAL_COMMUNITY): Payer: Self-pay | Admitting: Emergency Medicine

## 2021-04-03 ENCOUNTER — Other Ambulatory Visit: Payer: Self-pay

## 2021-04-03 DIAGNOSIS — Z20822 Contact with and (suspected) exposure to covid-19: Secondary | ICD-10-CM | POA: Insufficient documentation

## 2021-04-03 DIAGNOSIS — R0789 Other chest pain: Principal | ICD-10-CM | POA: Diagnosis present

## 2021-04-03 DIAGNOSIS — D649 Anemia, unspecified: Secondary | ICD-10-CM

## 2021-04-03 DIAGNOSIS — I480 Paroxysmal atrial fibrillation: Secondary | ICD-10-CM | POA: Insufficient documentation

## 2021-04-03 DIAGNOSIS — Z951 Presence of aortocoronary bypass graft: Secondary | ICD-10-CM | POA: Diagnosis not present

## 2021-04-03 DIAGNOSIS — Z8616 Personal history of COVID-19: Secondary | ICD-10-CM | POA: Insufficient documentation

## 2021-04-03 DIAGNOSIS — J45909 Unspecified asthma, uncomplicated: Secondary | ICD-10-CM | POA: Insufficient documentation

## 2021-04-03 DIAGNOSIS — N1832 Chronic kidney disease, stage 3b: Secondary | ICD-10-CM | POA: Diagnosis not present

## 2021-04-03 DIAGNOSIS — I2510 Atherosclerotic heart disease of native coronary artery without angina pectoris: Secondary | ICD-10-CM | POA: Insufficient documentation

## 2021-04-03 DIAGNOSIS — N183 Chronic kidney disease, stage 3 unspecified: Secondary | ICD-10-CM | POA: Insufficient documentation

## 2021-04-03 DIAGNOSIS — F1420 Cocaine dependence, uncomplicated: Secondary | ICD-10-CM | POA: Diagnosis present

## 2021-04-03 DIAGNOSIS — Z794 Long term (current) use of insulin: Secondary | ICD-10-CM | POA: Diagnosis not present

## 2021-04-03 DIAGNOSIS — I509 Heart failure, unspecified: Secondary | ICD-10-CM | POA: Insufficient documentation

## 2021-04-03 DIAGNOSIS — Z79899 Other long term (current) drug therapy: Secondary | ICD-10-CM | POA: Insufficient documentation

## 2021-04-03 DIAGNOSIS — I214 Non-ST elevation (NSTEMI) myocardial infarction: Secondary | ICD-10-CM

## 2021-04-03 DIAGNOSIS — D509 Iron deficiency anemia, unspecified: Secondary | ICD-10-CM | POA: Diagnosis present

## 2021-04-03 DIAGNOSIS — Z21 Asymptomatic human immunodeficiency virus [HIV] infection status: Secondary | ICD-10-CM | POA: Diagnosis present

## 2021-04-03 DIAGNOSIS — B20 Human immunodeficiency virus [HIV] disease: Secondary | ICD-10-CM | POA: Diagnosis present

## 2021-04-03 DIAGNOSIS — E1122 Type 2 diabetes mellitus with diabetic chronic kidney disease: Secondary | ICD-10-CM | POA: Diagnosis not present

## 2021-04-03 DIAGNOSIS — I130 Hypertensive heart and chronic kidney disease with heart failure and stage 1 through stage 4 chronic kidney disease, or unspecified chronic kidney disease: Secondary | ICD-10-CM | POA: Diagnosis not present

## 2021-04-03 LAB — RAPID URINE DRUG SCREEN, HOSP PERFORMED
Amphetamines: NOT DETECTED
Barbiturates: NOT DETECTED
Benzodiazepines: NOT DETECTED
Cocaine: POSITIVE — AB
Opiates: NOT DETECTED
Tetrahydrocannabinol: NOT DETECTED

## 2021-04-03 LAB — BASIC METABOLIC PANEL WITH GFR
Anion gap: 13 (ref 5–15)
BUN: 25 mg/dL — ABNORMAL HIGH (ref 8–23)
CO2: 23 mmol/L (ref 22–32)
Calcium: 9.2 mg/dL (ref 8.9–10.3)
Chloride: 103 mmol/L (ref 98–111)
Creatinine, Ser: 1.76 mg/dL — ABNORMAL HIGH (ref 0.61–1.24)
GFR, Estimated: 41 mL/min — ABNORMAL LOW
Glucose, Bld: 297 mg/dL — ABNORMAL HIGH (ref 70–99)
Potassium: 3.9 mmol/L (ref 3.5–5.1)
Sodium: 139 mmol/L (ref 135–145)

## 2021-04-03 LAB — PROTIME-INR
INR: 1 (ref 0.8–1.2)
Prothrombin Time: 13.3 seconds (ref 11.4–15.2)

## 2021-04-03 LAB — URINALYSIS, ROUTINE W REFLEX MICROSCOPIC
Bacteria, UA: NONE SEEN
Bilirubin Urine: NEGATIVE
Glucose, UA: >=500 mg/dL — AB
Ketones, ur: NEGATIVE mg/dL
Leukocytes,Ua: NEGATIVE
Nitrite: NEGATIVE
Protein, ur: 100 mg/dL — AB
Specific Gravity, Urine: 1.008 (ref 1.005–1.030)
pH: 6 (ref 5.0–8.0)

## 2021-04-03 LAB — GLUCOSE, CAPILLARY
Glucose-Capillary: 105 mg/dL — ABNORMAL HIGH (ref 70–99)
Glucose-Capillary: 143 mg/dL — ABNORMAL HIGH (ref 70–99)
Glucose-Capillary: 190 mg/dL — ABNORMAL HIGH (ref 70–99)

## 2021-04-03 LAB — RESP PANEL BY RT-PCR (FLU A&B, COVID) ARPGX2
Influenza A by PCR: NEGATIVE
Influenza B by PCR: NEGATIVE
SARS Coronavirus 2 by RT PCR: NEGATIVE

## 2021-04-03 LAB — CBC
HCT: 27.6 % — ABNORMAL LOW (ref 39.0–52.0)
Hemoglobin: 8.4 g/dL — ABNORMAL LOW (ref 13.0–17.0)
MCH: 24.4 pg — ABNORMAL LOW (ref 26.0–34.0)
MCHC: 30.4 g/dL (ref 30.0–36.0)
MCV: 80.2 fL (ref 80.0–100.0)
Platelets: 196 K/uL (ref 150–400)
RBC: 3.44 MIL/uL — ABNORMAL LOW (ref 4.22–5.81)
RDW: 15.1 % (ref 11.5–15.5)
WBC: 4.8 K/uL (ref 4.0–10.5)
nRBC: 0 % (ref 0.0–0.2)

## 2021-04-03 LAB — TROPONIN I (HIGH SENSITIVITY)
Troponin I (High Sensitivity): 227 ng/L (ref <18)
Troponin I (High Sensitivity): 216 ng/L (ref <18)
Troponin I (High Sensitivity): 149 ng/L (ref <18)

## 2021-04-03 LAB — APTT: aPTT: 27 s (ref 24–36)

## 2021-04-03 LAB — HEPARIN LEVEL (UNFRACTIONATED): Heparin Unfractionated: 0.23 IU/mL — ABNORMAL LOW (ref 0.30–0.70)

## 2021-04-03 LAB — POC OCCULT BLOOD, ED: Fecal Occult Bld: NEGATIVE

## 2021-04-03 LAB — CBG MONITORING, ED: Glucose-Capillary: 228 mg/dL — ABNORMAL HIGH (ref 70–99)

## 2021-04-03 MED ORDER — CLOPIDOGREL BISULFATE 75 MG PO TABS
75.0000 mg | ORAL_TABLET | Freq: Every day | ORAL | Status: DC
  Administered 2021-04-03 – 2021-04-04 (×2): 75 mg via ORAL
  Filled 2021-04-03 (×2): qty 1

## 2021-04-03 MED ORDER — INSULIN ASPART 100 UNIT/ML IJ SOLN: 0.0000 [IU] | INTRAMUSCULAR | Status: DC

## 2021-04-03 MED ORDER — HEPARIN BOLUS VIA INFUSION
4000.0000 [IU] | Freq: Once | INTRAVENOUS | Status: AC
  Administered 2021-04-03: 4000 [IU] via INTRAVENOUS
  Filled 2021-04-03: qty 4000

## 2021-04-03 MED ORDER — LIDOCAINE 5 % EX PTCH
1.0000 | MEDICATED_PATCH | CUTANEOUS | Status: DC
  Administered 2021-04-03: 1 via TRANSDERMAL
  Filled 2021-04-03: qty 1

## 2021-04-03 MED ORDER — ATORVASTATIN CALCIUM 40 MG PO TABS
40.0000 mg | ORAL_TABLET | Freq: Every day | ORAL | Status: DC
  Administered 2021-04-03 – 2021-04-04 (×2): 40 mg via ORAL
  Filled 2021-04-03 (×2): qty 1

## 2021-04-03 MED ORDER — DILTIAZEM HCL ER COATED BEADS 120 MG PO CP24
120.0000 mg | ORAL_CAPSULE | Freq: Every day | ORAL | Status: DC
  Administered 2021-04-03: 120 mg via ORAL
  Filled 2021-04-03: qty 1

## 2021-04-03 MED ORDER — ASPIRIN 81 MG PO CHEW
324.0000 mg | CHEWABLE_TABLET | Freq: Once | ORAL | Status: AC
  Administered 2021-04-03: 324 mg via ORAL
  Filled 2021-04-03: qty 4

## 2021-04-03 MED ORDER — FAMOTIDINE 20 MG PO TABS
20.0000 mg | ORAL_TABLET | Freq: Every day | ORAL | Status: DC
  Administered 2021-04-03 (×2): 20 mg via ORAL
  Filled 2021-04-03 (×2): qty 1

## 2021-04-03 MED ORDER — INSULIN GLARGINE 100 UNIT/ML ~~LOC~~ SOLN
15.0000 [IU] | Freq: Every day | SUBCUTANEOUS | Status: DC
  Administered 2021-04-03: 15 [IU] via SUBCUTANEOUS
  Filled 2021-04-03 (×2): qty 0.15

## 2021-04-03 MED ORDER — HEPARIN BOLUS VIA INFUSION
4000.0000 [IU] | Freq: Once | INTRAVENOUS | Status: DC
  Filled 2021-04-03: qty 4000

## 2021-04-03 MED ORDER — ONDANSETRON HCL 4 MG/2ML IJ SOLN: 4.0000 mg | Freq: Four times a day (QID) | INTRAMUSCULAR | Status: DC | PRN

## 2021-04-03 MED ORDER — HEPARIN (PORCINE) 25000 UT/250ML-% IV SOLN
1150.0000 [IU]/h | INTRAVENOUS | Status: DC
  Administered 2021-04-03: 850 [IU]/h via INTRAVENOUS
  Administered 2021-04-04: 1150 [IU]/h via INTRAVENOUS
  Filled 2021-04-03 (×2): qty 250

## 2021-04-03 MED ORDER — INSULIN ASPART 100 UNIT/ML IJ SOLN
10.0000 [IU] | Freq: Once | INTRAMUSCULAR | Status: AC
  Administered 2021-04-03: 10 [IU] via SUBCUTANEOUS

## 2021-04-03 MED ORDER — INSULIN ASPART 100 UNIT/ML IJ SOLN
0.0000 [IU] | Freq: Three times a day (TID) | INTRAMUSCULAR | Status: DC
  Administered 2021-04-03: 2 [IU] via SUBCUTANEOUS
  Administered 2021-04-04: 3 [IU] via SUBCUTANEOUS
  Administered 2021-04-04: 2 [IU] via SUBCUTANEOUS

## 2021-04-03 MED ORDER — NITROGLYCERIN 0.4 MG SL SUBL
0.4000 mg | SUBLINGUAL_TABLET | SUBLINGUAL | Status: AC | PRN
  Administered 2021-04-03 (×3): 0.4 mg via SUBLINGUAL
  Filled 2021-04-03: qty 1

## 2021-04-03 MED ORDER — HEPARIN BOLUS VIA INFUSION
1050.0000 [IU] | Freq: Once | INTRAVENOUS | Status: AC
  Administered 2021-04-03: 1050 [IU] via INTRAVENOUS
  Filled 2021-04-03: qty 1050

## 2021-04-03 MED ORDER — AMLODIPINE BESYLATE 5 MG PO TABS
5.0000 mg | ORAL_TABLET | Freq: Every day | ORAL | Status: DC
  Administered 2021-04-03 – 2021-04-04 (×2): 5 mg via ORAL
  Filled 2021-04-03 (×2): qty 1

## 2021-04-03 MED ORDER — EMTRICITAB-RILPIVIR-TENOFOV AF 200-25-25 MG PO TABS
1.0000 | ORAL_TABLET | Freq: Every day | ORAL | Status: DC
  Administered 2021-04-03 – 2021-04-04 (×2): 1 via ORAL
  Filled 2021-04-03 (×2): qty 1

## 2021-04-03 MED ORDER — INSULIN ASPART 100 UNIT/ML IJ SOLN
4.0000 [IU] | Freq: Three times a day (TID) | INTRAMUSCULAR | Status: DC
  Administered 2021-04-03 – 2021-04-04 (×2): 4 [IU] via SUBCUTANEOUS

## 2021-04-03 MED ORDER — ASPIRIN EC 81 MG PO TBEC
81.0000 mg | DELAYED_RELEASE_TABLET | Freq: Every day | ORAL | Status: DC
  Administered 2021-04-04: 81 mg via ORAL
  Filled 2021-04-03: qty 1

## 2021-04-03 MED ORDER — ISOSORBIDE MONONITRATE ER 60 MG PO TB24
120.0000 mg | ORAL_TABLET | Freq: Every day | ORAL | Status: DC
  Administered 2021-04-03 – 2021-04-04 (×2): 120 mg via ORAL
  Filled 2021-04-03: qty 4
  Filled 2021-04-03: qty 2

## 2021-04-03 MED ORDER — NITROGLYCERIN 2 % TD OINT
1.0000 [in_us] | TOPICAL_OINTMENT | Freq: Four times a day (QID) | TRANSDERMAL | Status: DC | PRN
  Filled 2021-04-03: qty 30

## 2021-04-03 MED ORDER — ACETAMINOPHEN 325 MG PO TABS: 650.0000 mg | ORAL_TABLET | ORAL | Status: DC | PRN

## 2021-04-03 MED ORDER — NITROGLYCERIN 2 % TD OINT
1.0000 [in_us] | TOPICAL_OINTMENT | Freq: Four times a day (QID) | TRANSDERMAL | Status: DC
  Administered 2021-04-03 (×2): 1 [in_us] via TOPICAL
  Filled 2021-04-03: qty 30
  Filled 2021-04-03: qty 1

## 2021-04-03 NOTE — ED Notes (Signed)
Attempted to call report x 1  

## 2021-04-03 NOTE — Consult Note (Addendum)
Cardiology Consultation:   Patient ID: Michael [name] MRN: [id number]; DOB: [birth year]  Admit date: 04/03/2021 Date of Consult: 04/03/2021  PCP:  Michael Demark, PA-C   [facility] Providers Cardiologist:  None       NEW Patient Profile:   Michael [name] is a 70 y.o. male with a hx of CAD with multiple DES to RCA CABG in 2019, HTN< DM-2, CKD-3 and HIV who is being seen 04/03/2021 for the evaluation of NSTEMI at the request of Dr. Heber Michael [name].  History of Present Illness:   [name] with CAD with PCI in 2015 and CABG in 2019  X 3 with MAZE with left atrial appendage exclusion,  LIMA to LAD, VG to PDA VG to 2nd OM, he did have post op a fib.  Cardiac cath 05/2020 with LAD has an 80% proximal and 70% mid in-stent restenosis. Distal LAD has a 70% stenosis. LCX has a 70% proximal stenosis. RCA has serial long diffuse 70-90% from the proximal to the distal RCA. LIMA to LAD is atretic and no antigrade perfusion. There was successful stenting of the RCA.  Attempt to stent PDA a few days later was unsuccessful.  The LAD was not intervened upon at that time.    In 02/2021 pt had elevated troponin with chest pain and use of cocaine and underwent cardiac cath.  LIMA known atretic, LM patent,  70% instent prox LAD disease 90% distal LAD lesion.  These appear relatively unchanged from previous cath. 70% mid LCX then 99% OM lesion.  This OM is served by a patent SVG to OM.  RCA has extensive stenting which are patent.  As seen in prior films 90% ostium PDA that is jailed.  At that time with ongoing cocaine use no intervention was attempted. Plan was for stopping cocaine then would schedule for outpt PCI of LAD.   Right Posterior Descending Artery: RPDA lesion is 99% stenosed.  LIMA Graft To Dist LAD: The graft is atretic. Origin to Insertion lesion is 100% stenosed.  Graft To 1st Mrg Vein Graft To RPDA: Origin to Insertion lesion is 100% stenosed.  He has been seen in ER with [facility] and [facility]  since last cath.  Last echo 02/22/21 EF 55-60%, RV normal trace MR, trace TR mild AR Pt has moved to [place] and is living on the street.  Today pt presented to ER after using crack last pm, first time in months.   But presented with chest pain.  Started on Lt side and radiates across chest.  Pt is living on the street, does not go to shelter sleeps [place] sidewalk [place] in sleeping bag. Family in [facility] but he was the problem so he left.  He sometimes takes his meds, he has them but has kind of given up.  He takes the cocaine to forget.    Hs Tropoin 227,  216 Na 139, K+ 3.9, BUN 25, Cr 1.76  Hgb 8.4 WBC 4.8 plts 196  UDS + cocaine  2V CXR with NAD  Stool heme neg  EKG:  The EKG was personally reviewed and demonstrates:  SR at 81 Non specific T wave abnormalities. Telemetry:  Telemetry was personally reviewed and demonstrates:  SR  BP 158/95 P 80s afebrile. Still with some mild pain.  He is eating lunch.   Past Medical History:  Diagnosis Date   A-fib (Michael [name])    Asthma    No PFTs, history of childhood asthma   CAD (coronary artery  disease)    a. 06/2013 STEMI/PCI ([facility]): LAD w/ thrombus (treated with BMS), mid 75%, D2 75%; LCX OM2 75%; RCA small, PDA 95%, PLV 95%;  b. 10/2013 Cath/PCI: ISR w/in LAD (Promus DES x 2), borderline OM2 lesion;  c. 01/2014 MV: Intermediate risk, medium-sized distal ant wall infarct w/ very small amt of peri-infarct ischemia. EF 60%.   Cellulitis 04/2014   left facial   Cellulitis and abscess of toe of right foot 12/08/2019   Chondromalacia of medial femoral condyle    Left knee MRI 04/28/12: Chondromalacia of the medial femoral condyle with slight peripheral degeneration of the meniscocapsular junction of the medial meniscus; followed by sports medicine   Collagen vascular disease ([place])    Crack cocaine use    for 20+ years, has been enrolled in detox programs in the past   Depression    with history of hospitalization for suicidal ideation    Diabetes mellitus 2002   Diagnosed in 2002, started insulin in 2012   Gout    Gout 04/28/2012   Headache(784.0)    CT head 08/2011: Periventricular and subcortical white matter hypodensities are most in keeping with chronic microangiopathic change   HIV infection Michael [name]) Nov 2012   Followed by [name]   Hyperlipidemia    Hypertension    Pulmonary embolism [facility])     Past Surgical History:  Procedure Laterality Date   AMPUTATION Right 07/21/2019   Procedure: RIGHT SECOND TOE AMPUTATION;  Surgeon: [name], MD;  Location: [place];  Service: Orthopedics;  Laterality: Right;   BACK [name]     1988   BOWEL RESECTION     CARDIAC [name]     CERVICAL SPINE [name]     " rods in my neck "   CORONARY ARTERY BYPASS GRAFT     CORONARY STENT PLACEMENT     NM MYOCAR PERF WALL MOTION  12/27/2011   normal   SPINE [name]       Home Medications:  Prior to Admission medications   Medication Sig Start Date End Date Taking? Authorizing Provider  albuterol (VENTOLIN HFA) 108 (90 Base) MCG/ACT inhaler Inhale 1-2 puffs into the lungs every 6 (six) hours as needed for wheezing or shortness of breath. 12/09/19   Eugenie Filler, MD  amLODipine (NORVASC) 5 MG tablet Take 5 mg by mouth daily. 02/20/21   [provider]  atorvastatin (LIPITOR) 40 MG tablet Take 1 tablet (40 mg total) by mouth daily at 6 PM. Patient not taking: Reported on 04/03/2021 12/09/19   Eugenie Filler, MD  carvedilol (COREG) 6.25 MG tablet Take 6.25 mg by mouth 2 (two) times daily. 02/16/21   [provider]  clopidogrel (PLAVIX) 75 MG tablet Take 75 mg by mouth daily. 02/16/21   [provider]  diltiazem (CARDIZEM CD) 120 MG 24 hr capsule Take 1 capsule (120 mg total) by mouth daily. For blood pressure Patient not taking: Reported on 04/03/2021 12/09/19   Eugenie Filler, MD  famotidine (PEPCID) 20 MG tablet Take 20 mg by mouth daily. 02/23/21   [provider]  insulin glargine (LANTUS)  100 UNIT/ML Solostar Pen Inject 30 Units into the skin daily. Patient not taking: Reported on 04/03/2021 12/09/19   Eugenie Filler, MD  insulin lispro (HUMALOG KWIKPEN) 100 UNIT/ML KwikPen Inject 0.04 mLs (4 Units total) into the skin 3 (three) times daily with meals. Patient not taking: Reported on 04/03/2021 12/09/19   Eugenie Filler, MD  Insulin Pen  Needle 31G X 5 MM MISC 4 Units by Does not apply route in the morning, at noon, and at bedtime. 12/09/19   Eugenie Filler, MD  isosorbide mononitrate (IMDUR) 120 MG 24 hr tablet Take 120 mg by mouth daily. 02/20/21   [provider]  losartan (COZAAR) 50 MG tablet Take 50 mg by mouth daily. 02/16/21   [provider]  mirtazapine (REMERON) 15 MG tablet Take 15 mg by mouth at bedtime. 02/21/21   [provider]  nitroGLYCERIN (NITROSTAT) 0.4 MG SL tablet Place 1 tablet (0.4 mg total) under the tongue every 5 (five) minutes as needed for chest pain. 12/09/19   Eugenie Filler, MD  ODEFSEY 200-25-25 MG TABS tablet Take 1 tablet by mouth daily. 02/06/21   [provider]  sertraline (ZOLOFT) 50 MG tablet Take 1 tablet (50 mg total) by mouth daily. For depression Patient not taking: Reported on 04/03/2021 12/09/19   Eugenie Filler, MD  traZODone (DESYREL) 100 MG tablet Take 1 tablet (100 mg total) by mouth at bedtime as needed for sleep. 12/09/19   Eugenie Filler, MD  XARELTO 20 MG TABS tablet Take 20 mg by mouth daily. 02/06/21   [provider]    Inpatient Medications: Scheduled Meds:  amLODipine  5 mg Oral Daily   [START ON 04/04/2021] aspirin EC  81 mg Oral Daily   atorvastatin  40 mg Oral Daily   diltiazem  120 mg Oral Daily   emtricitabine-rilpivir-tenofovir AF  1 tablet Oral Daily   famotidine  20 mg Oral Daily   insulin aspart  0-15 Units Subcutaneous Q4H   isosorbide mononitrate  120 mg Oral Daily   nitroGLYCERIN  1 inch Topical Q6H   Continuous Infusions:  heparin 850 Units/hr (04/03/21  0901)   PRN Meds: acetaminophen, nitroGLYCERIN, ondansetron (ZOFRAN) IV  Allergies:   No Known Allergies  Social History:   Social History   Socioeconomic History   Marital status: Widowed    Spouse name: Not on file   Number of children: 2   Years of education: 12   Highest education level: 12th grade  Occupational History    Employer: UNEMPLOYED    Comment: 04/2016  Tobacco Use   Smoking status: Never   Smokeless tobacco: Never  Vaping Use   Vaping Use: Never used  Substance and Sexual Activity   Alcohol use: No    Alcohol/week: 4.0 standard drinks    Types: 2 Cans of beer, 2 Shots of liquor per week   Drug use: Yes    Frequency: 4.0 times per week    Types: "Crack" cocaine, Cocaine    Comment: Recent use of crack   Sexual activity: Yes    Comment: accepted condoms  Other Topics Concern   Not on file  Social History Narrative   Currently staying with a friend in [place].  Was staying @ local motel until a few days ago - left b/c of bed bugs.   P[facility] up [facility].  Not followed by a psychiatrist.   Social Determinants of Health   Financial Resource Strain: Not on file  Food Insecurity: Not on file  Transportation Needs: Not on file  Physical Activity: Not on file  Stress: Not on file  Social Connections: Not on file  Intimate Partner Violence: Not on file    Family History:    Family History  Problem Relation Age of Onset   Diabetes Mother    Hypertension Mother  Hyperlipidemia Mother    Diabetes Father    Cancer Father    Hypertension Father    Diabetes Brother    Heart disease Brother    Diabetes Sister    Colon cancer Neg Hx      ROS:  Please see the history of present illness.  General:no colds or fevers, no weight changes Skin:no rashes or ulcers HEENT:no blurred vision, no congestion CV:see HPI PUL:see HPI GI:no diarrhea constipation or melena, no indigestion GU:no hematuria, no dysuria MS:no joint pain, no  claudication Neuro:no syncope, no lightheadedness Endo:+ diabetes, no thyroid disease Psych:  substance abuse, depression  All other ROS reviewed and negative.     Physical Exam/Data:   Vitals:   04/03/21 1045 04/03/21 1100 04/03/21 1115 04/03/21 1138  BP: (!) 170/118 (!) 168/107 (!) 160/86 (!) 158/95  Pulse: 80 79 80 (!) 58  Resp: (!) 8 13 11 20   Temp:   97.8 F (36.6 C) 97.6 F (36.4 C)  TempSrc:   Oral Oral  SpO2: 100% 100% 100% 100%  Weight:      Height:        Intake/Output Summary (Last 24 hours) at 04/03/2021 1303 Last data filed at 04/03/2021 1124 Gross per 24 hour  Intake --  Output 700 ml  Net -700 ml   Last 3 Weights 04/03/2021 12/07/2019 12/07/2019  Weight (lbs) 153 lb 158 lb 15.2 oz 164 lb  Weight (kg) 69.4 kg 72.1 kg 74.39 kg  Some encounter information is confidential and restricted. Go to Review Flowsheets activity to see all data.     Body mass index is 23.26 kg/m.  General:  Well nourished, well developed, in no acute distress HEENT: normal Lymph: no adenopathy Neck: no JVD Endocrine:  No thryomegaly Vascular: No carotid bruits; pedal pulses 2+ bilaterally  Cardiac:  normal S1, S2; RRR; no murmur gallup rub or click Lungs:  clear to auscultation bilaterally, no wheezing, rhonchi or rales  Abd: soft, nontender, no hepatomegaly  Ext: no edema Musculoskeletal:  No deformities, BUE and BLE strength normal and equal Skin: warm and dry  Neuro:  alert and oriented X 3 MAE follows commands  no focal abnormalities noted Psych:  Normal affect   Relevant CV Studies:  Echo 02/22/21 Left Ventricle  Left ventricle size is normal. Wall thickness is normal. Systolic function is normal. EF: 55-60%. Wall motion is normal. Doppler parameters consistent with mild diastolic dysfunction and low to normal LA pressure.   Right Ventricle  Right ventricle is normal. Systolic function is normal.   Left Atrium  Left atrium is normal in size.   Right Atrium  Normal sized  right atrium.   Mitral Valve  Mitral valve structure is normal. There is trace regurgitation.   Tricuspid Valve  Tricuspid valve structure is normal. There is trace regurgitation. The right ventricular systolic pressure is normal (<36 mmHg).   Aortic Valve  The aortic valve is tricuspid. The leaflets are not thickened and exhibit normal excursion. Mild aortic valve regurgitation. There is no evidence of aortic valve stenosis.   Pulmonic Valve  Pulmonic valve structure is normal. Trace regurgitation.   Ascending Aorta  The aortic root is normal in size.   Pericardium  There is no pericardial effusion.   Study Details  A complete echo was performed using complete 2D, color flow Doppler and spectral Doppler. During the study the apical, parasternal and subcostal viewwas captured.   Cardiac Cath  02/23/21 Multi-vessel coronary artery disease.   LCP for  elevated troponin and chest pain in the setting of known  obstructive CAD and cocaine use.  Right radial artery (LIMA known atretic).  LM patent.  70% instent prox LAD disease 90% distal LAD lesion.  These appear  relatively unchanged from previous cath.  70% mid LCX then 99% OM lesion.  This OM is served by a patent SVG to OM  RCA has extensive stenting which are patent.  As seen in prior films 90%  ostium PDA that is jailed.  [place] for hemostasis  EBL<5 cc  No complications.    His CAD is stable.  In the setting of active cocaine use fresh stenting poses him a greater  risk then his stable CAD.  Recommend he follow up with cardiology (I am happy to see him) for [place].    If negative for cocaine then will schedule him for outpatient PCI of LAD.   Coronary Findings Diagnostic Dominance: Right  Left Anterior Descending: Prox LAD lesion is 60% stenosed. Mid LAD lesion is 70% stenosed. The lesion was previously treated using a stent of unknown type. Dist LAD lesion is 90% stenosed.  Left Circumflex: Prox Cx lesion is 70%  stenosed. First Obtuse Marginal Branch: 1st Mrg lesion is 99% stenosed.  Right Coronary Artery: Previously placed Prox RCA drug eluting stent is widely patent. Culprit lesion. The lesion was previously treated using angioplasty. Stent delivery was done by way of balloon expansion. Previously placed Mid RCA drug eluting stent is widely patent. Not the culprit lesion. The lesion was previously treated using angioplasty. Stent delivery was done by way of balloon expansion. Previously placed Dist RCA stent (unknown type) is widely patent. Right Posterior Descending Artery: RPDA lesion is 99% stenosed.  LIMA Graft To Dist LAD: The graft is atretic. Origin to Insertion lesion is 100% stenosed.  Graft To 1st Mrg  Vein Graft To RPDA: Origin to Insertion lesion is 100% stenosed.   Intervention  No interventions have been documented.     Laboratory Data:  High Sensitivity Troponin:   Recent Labs  Lab 04/03/21 0657 04/03/21 0836  TROPONINIHS 227* 216*     Chemistry Recent Labs  Lab 04/03/21 0657  NA 139  K 3.9  CL 103  CO2 23  GLUCOSE 297*  BUN 25*  CREATININE 1.76*  CALCIUM 9.2  GFRNONAA 41*  ANIONGAP 13    No results for input(s): PROT, ALBUMIN, AST, ALT, ALKPHOS, BILITOT in the last 168 hours. Hematology Recent Labs  Lab 04/03/21 0657  WBC 4.8  RBC 3.44*  HGB 8.4*  HCT 27.6*  MCV 80.2  MCH 24.4*  MCHC 30.4  RDW 15.1  PLT 196   BNPNo results for input(s): BNP, PROBNP in the last 168 hours.  DDimer No results for input(s): DDIMER in the last 168 hours.   Radiology/Studies:  DG Chest 2 View  Result Date: 04/03/2021 CLINICAL DATA:  70 year old male with left side chest pain, tightness and shortness of breath. EXAM: CHEST - 2 VIEW COMPARISON:  Portable chest 12/12/2019 and earlier. FINDINGS: Mediastinal contours are stable and within normal limits. Normal lung volumes. Prior sternotomy and postoperative changes to the left atrial appendage appear stable since 2020. Chronic  coronary artery stent(s). No pneumothorax, pulmonary edema, pleural effusion or confluent pulmonary opacity. Chronic cervical ACDF and thoracic endplate spurring. No acute osseous abnormality identified. Negative visible bowel gas pattern. IMPRESSION: No acute cardiopulmonary abnormality. Electronically Signed   By: Genevie Ann M.D.   On: 04/03/2021 07:22     Assessment  and Plan:   Unstable angina/NSTEMI with use of cocaine.  Pt with known hx of CAD and CABG 2019 and graft failure of LIMA, RCA with new stents placed 05/2020 due to stenosis of VG to PDA, VG to PDA patent.  Last cath 02/2021 with patent RCA stents and 70% instent prox LAD disease 90% distal LAD lesion, his CAD was stable and with recent cocaine use no PCI of LAD planned though would do in the future if pt has stopped cocaine.  This may be demand ischemia with cocaine use. On plavix no ASA  , NTG paste added and no imdur 120 mg.  Here with anemia plavix stopped pt has not been taking meds daily just some days and pt placed on ASA 81 mg. IV heparin added.  HTN  has been on coreg 6.25 BID, amlodipine 5 mg daily, dilt 120 daily,  now BP elevated 159/95 on amlodipine, dilt, imdur 120 mg HLD on lipitor 40 labs per [facility]  02/21/21 LDL 53, HDL 50 and TG 85 Tchol 120  HIV per IM DM-2 per IM CKD - 3a but at times 3b per labs from [facility] Anemia, chronic per [facility] labs HGB from 7.5 to 8.5 since Nov 2021. Substance abuse/depression, pt is amenable to getting help, will defer to primary team   Risk Assessment/Risk Scores:     HEAR Score (for undifferentiated chest pain):  HEAR Score: 6     For questions or updates, please contact [place] Please consult [email or web address] for contact info under   Signed, Cecilie Kicks, NP  04/03/2021 1:03 PM  Patient seen, examined. Available data reviewed. Agree with findings, assessment, and plan as outlined by Cecilie Kicks, NP. On my exam: Vitals:   04/03/21 1115 04/03/21 1138  BP: (!) 160/86 (!) 158/95   Pulse: 80 (!) 58  Resp: 11 20  Temp: 97.8 F (36.6 C) 97.6 F (36.4 C)  SpO2: 100% 100%   Pt is alert and oriented, NAD HEENT: normal Neck: JVP - normal, carotids with no bruits Lungs: CTA bilaterally CV: RRR without murmur or gallop Abd: soft, NT, Positive BS, no hepatomegaly Ext: no C/C/E, distal pulses intact and equal Skin: warm/dry no rash Neuro: flat affect, moves all extremities, grossly intact  EKG shows normal sinus rhythm 81 bpm with early repolarization pattern.  Difficult situation in this patient with chronic ongoing cocaine use, last used yesterday.  Notes reviewed from [facility] as the patient had a recent cardiac catheterization with moderately severe LAD stenosis and recommendations for medical therapy in the setting of ongoing cocaine use.  It is felt that it is probably safer to continue with medical treatment rather than placing a coronary stent in this patient who has demonstrated medical noncompliance.  The patient has a relatively low level, flat troponin trend here with an initial troponin of 227 and a follow-up troponin of 216.  His EKG shows no acute changes.  I would recommend continuing his IV heparin overnight and stopping it in the morning.  His medical program includes amlodipine, diltiazem, and isosorbide.  He remains on aspirin.  He should not be on 2 different calcium channel blockers, will discontinue diltiazem.  No further recommendations at this time.  The patient is counseled regarding the need to stop using cocaine.  We will add clopidogrel 75 mg daily to his medical regimen.  Sherren Mocha, M.D. 04/03/2021 2:36 PM

## 2021-04-03 NOTE — Progress Notes (Signed)
ANTICOAGULATION CONSULT NOTE - Initial Consult  Pharmacy Consult for heparin Indication: chest pain/ACS  No Known Allergies  Patient Measurements: Height: 5\' 8"  (172.7 cm) Weight: 69.4 kg (153 lb) IBW/kg (Calculated) : 68.4 Heparin Dosing Weight: 69.4 kg  Vital Signs: Temp: 98.7 F (37.1 C) (06/28 0625) Temp Source: Oral (06/28 0625) BP: 170/94 (06/28 0815) Pulse Rate: 75 (06/28 0815)  Labs: Recent Labs    04/03/21 0657  HGB 8.4*  HCT 27.6*  PLT 196  CREATININE 1.76*  TROPONINIHS 227*    Estimated Creatinine Clearance: 37.8 mL/min (A) (by C-G formula based on SCr of 1.76 mg/dL (H)).  Assessment: 70 yo M presenting with chest pain from onset more than 6 hours ago. Chest pain is sharp and not worse with exertion, located in the middle/left side. No pressure/tightness per patient report. PMHx of DM, HTN, HLD, CAD (s/p CABG in 2019), COPD, CKD stage III, and HIV. Found to have troponin of 297 in the ED. Pt was on Xarelto back in May 2022 but was discontinued at OSH due to no indication for [facility] per note, taken previously for remote hx of PE back in 2018.  Slight anemia with Hgb of 8.4 in ED - no signs of GIB and guaiac negative. Hgb seems to be between 8-10 overall.  Goal of Therapy:  Heparin level 0.3-0.7 units/ml Monitor platelets by anticoagulation protocol: Yes   Plan:  Give 4000 units bolus x 1 Start heparin infusion at 850 units/hr -f/u 8h HL @ 1700 on 6/28 -Monitor daily HL, CBC, and any s/sx of bleeding  Joetta Manners, PharmD, [facility] Clinical Pharmacist  Please check AMION for all [place] phone numbers After 10:00 PM, call [place] [phone]

## 2021-04-03 NOTE — ED Notes (Signed)
Trop 227 Abnormal result given to Dr. Tomi Bamberger and primary RN.

## 2021-04-03 NOTE — ED Provider Notes (Addendum)
[facility] EMERGENCY DEPARTMENT Provider Note   CSN: [id number] Arrival date & time: 04/03/21  0623     History Chief Complaint  Patient presents with   Chest Pain    Michael [name] is a 70 y.o. male.   Chest Pain  HPI: A 70 year old patient with a history of treated diabetes, hypertension and hypercholesterolemia presents for evaluation of chest pain. Initial onset of pain was more than 6 hours ago. The patient's chest pain is sharp and is not worse with exertion. The patient's chest pain is middle- or left-sided, is not well-localized, is not described as heaviness/pressure/tightness and does not radiate to the arms/jaw/neck. The patient does not complain of nausea and denies diaphoresis. The patient has no history of stroke, has no history of peripheral artery disease, has not smoked in the past 90 days, has no relevant family history of coronary artery disease (first degree relative at less than age 39) and does not have an elevated BMI (>=30).  Patient states he is has been having issues with chest pain for at least a year.  Symptoms did get worse last night after using crack cocaine.  Pain gets worse with deep breathing.  Patient also states he has not taken his blood pressure medications for several days Past Medical History:  Diagnosis Date   A-fib ([place])    Asthma    No PFTs, history of childhood asthma   CAD (coronary artery disease)    a. 06/2013 STEMI/PCI ([facility]): LAD w/ thrombus (treated with BMS), mid 75%, D2 75%; LCX OM2 75%; RCA small, PDA 95%, PLV 95%;  b. 10/2013 Cath/PCI: ISR w/in LAD (Promus DES x 2), borderline OM2 lesion;  c. 01/2014 MV: Intermediate risk, medium-sized distal ant wall infarct w/ very small amt of peri-infarct ischemia. EF 60%.   Cellulitis 04/2014   left facial   Cellulitis and abscess of toe of right foot 12/08/2019   Chondromalacia of medial femoral condyle    Left knee MRI 04/28/12: Chondromalacia of the medial femoral condyle  with slight peripheral degeneration of the meniscocapsular junction of the medial meniscus; followed by sports medicine   Collagen vascular disease ([place])    Crack cocaine use    for 20+ years, has been enrolled in detox programs in the past   Depression    with history of hospitalization for suicidal ideation   Diabetes mellitus 2002   Diagnosed in 2002, started insulin in 2012   Gout    Gout 04/28/2012   Headache(784.0)    CT head 08/2011: Periventricular and subcortical white matter hypodensities are most in keeping with chronic microangiopathic change   HIV infection [facility]) Nov 2012   Followed by [name]   Hyperlipidemia    Hypertension    Pulmonary embolism [facility])     Patient Active Problem List   Diagnosis Date Noted   Homelessness 12/13/2019   Cellulitis 12/07/2019   Osteomyelitis ([place]) 07/17/2019   Abscess or cellulitis of toe, right    Toe pain, right-second    MDD (major depressive disorder), recurrent episode, severe ([place]) 06/15/2019   Respiratory distress 04/12/2019   Pneumonia due to severe acute respiratory syndrome coronavirus 2 (SARS-CoV-2) 04/11/2019   COVID-19 virus infection 03/19/2019   Chest pain 10/03/2018   Malnutrition of moderate degree 09/21/2018   MDD (major depressive disorder), recurrent episode, moderate ([place])    Type II diabetes mellitus with renal manifestations ([place]) 05/04/2018   GERD (gastroesophageal reflux disease) 05/04/2018   S/P CABG (coronary artery  bypass graft)    Left testicular pain    Depression 02/20/2018   Epididymo-orchitis, acute 02/20/2018   Essential hypertension 02/20/2018   Precordial chest pain 02/20/2018   AKI (acute kidney injury) ([place]) 02/14/2018   HCAP (healthcare-associated pneumonia) 02/14/2018   History of pulmonary embolism 07/04/2017   Acute hyponatremia 05/11/2017   Hyperglycemia due to type 2 diabetes mellitus ([place]) 05/11/2017   CHF (congestive heart failure) ([place]) 04/01/2017   Hepatitis C 12/30/2016    Chronic ischemic heart disease 11/12/2016   Paroxysmal A-fib ([place]) 11/12/2016   Back pain 04/18/2016   S/P carotid endarterectomy 11/15/2015   MDD (major depressive disorder), recurrent severe, without psychosis ([place]) 09/09/2015   Cocaine-induced mood disorder ([place]) 08/14/2015   Cocaine abuse with cocaine-induced mood disorder ([place]) 08/14/2015   Gout 07/10/2015   Acute renal failure superimposed on stage 3 chronic kidney disease ([place]) 03/06/2015   Anemia 03/06/2015   Chronic kidney disease, stage III (moderate) 03/06/2015   Hypoglycemia    Encounter for general adult medical examination with abnormal findings 02/09/2015   Cocaine use disorder, severe, dependence ([place]) 12/13/2014   Substance or medication-induced depressive disorder with onset during withdrawal ([place]) 12/13/2014   Severe recurrent major depressive disorder with psychotic features ([place]) 12/12/2014   Cervicalgia 06/28/2014   Lumbar radiculopathy, chronic 06/28/2014   Asthma, chronic 02/03/2014   3-vessel CAD 06/24/2013   ED (erectile dysfunction) of organic origin 07/07/2012   Hypertension goal BP (blood pressure) < 140/80 04/29/2012   Chondromalacia of left knee 03/19/2012   Hyperlipidemia with target LDL less than 100 02/12/2012   Fibromyalgia 02/12/2012   HIV (human immunodeficiency virus infection) ([place]) 08/27/2011   Uncontrolled type 2 diabetes with neuropathy ([place]) 10/17/2000    Past Surgical History:  Procedure Laterality Date   AMPUTATION Right 07/21/2019   Procedure: RIGHT SECOND TOE AMPUTATION;  Surgeon: [name], MD;  Location: [place];  Service: Orthopedics;  Laterality: Right;   BACK SURGERY     1988   BOWEL RESECTION     CARDIAC SURGERY     CERVICAL SPINE SURGERY     " rods in my neck "   CORONARY ARTERY BYPASS GRAFT     CORONARY STENT PLACEMENT     NM MYOCAR PERF WALL MOTION  12/27/2011   normal   SPINE SURGERY         Family History  Problem Relation Age of Onset   Diabetes Mother     Hypertension Mother    Hyperlipidemia Mother    Diabetes Father    Cancer Father    Hypertension Father    Diabetes Brother    Heart disease Brother    Diabetes Sister    Colon cancer Neg Hx     Social History   Tobacco Use   Smoking status: Never   Smokeless tobacco: Never  Vaping Use   Vaping Use: Never used  Substance Use Topics   Alcohol use: No    Alcohol/week: 4.0 standard drinks    Types: 2 Cans of beer, 2 Shots of liquor per week   Drug use: Yes    Frequency: 4.0 times per week    Types: "Crack" cocaine, Cocaine    Comment: Recent use of crack    Home Medications Prior to Admission medications   Medication Sig Start Date End Date Taking? Authorizing Provider  albuterol (VENTOLIN HFA) 108 (90 Base) MCG/ACT inhaler Inhale 1-2 puffs into the lungs every 6 (six) hours as needed for wheezing or shortness of  breath. 12/09/19   Eugenie Filler, MD  allopurinol (ZYLOPRIM) 100 MG tablet Take 100 mg by mouth daily as needed (gout).  07/30/19   [provider]  atorvastatin (LIPITOR) 40 MG tablet Take 1 tablet (40 mg total) by mouth daily at 6 PM. 12/09/19   Eugenie Filler, MD  diltiazem (CARDIZEM CD) 120 MG 24 hr capsule Take 1 capsule (120 mg total) by mouth daily. For blood pressure 12/09/19   Eugenie Filler, MD  famotidine (PEPCID) 40 MG tablet Take 1 tablet (40 mg total) by mouth daily. 12/09/19   Eugenie Filler, MD  gabapentin (NEURONTIN) 400 MG capsule Take 1 capsule (400 mg total) by mouth 2 (two) times daily as needed (agitation/neuropathy). 12/09/19 01/08/20  Eugenie Filler, MD  insulin glargine (LANTUS) 100 UNIT/ML Solostar Pen Inject 30 Units into the skin daily. 12/09/19   Eugenie Filler, MD  insulin lispro (HUMALOG KWIKPEN) 100 UNIT/ML KwikPen Inject 0.04 mLs (4 Units total) into the skin 3 (three) times daily with meals. 12/09/19   Eugenie Filler, MD  Insulin Pen Needle 31G X 5 MM MISC 4 Units by Does not apply route in the morning, at noon, and  at bedtime. 12/09/19   Eugenie Filler, MD  nitroGLYCERIN (NITROSTAT) 0.4 MG SL tablet Place 1 tablet (0.4 mg total) under the tongue every 5 (five) minutes as needed for chest pain. 12/09/19   Eugenie Filler, MD  ondansetron (ZOFRAN) 4 MG tablet Take 1 tablet (4 mg total) by mouth every 8 (eight) hours as needed for nausea or vomiting. 08/21/19   Fatima Blank, MD  sertraline (ZOLOFT) 50 MG tablet Take 1 tablet (50 mg total) by mouth daily. For depression 12/09/19   Eugenie Filler, MD  traMADol (ULTRAM) 50 MG tablet Take 1 tablet (50 mg total) by mouth every 6 (six) hours as needed for moderate pain. 12/09/19   Eugenie Filler, MD  traZODone (DESYREL) 100 MG tablet Take 1 tablet (100 mg total) by mouth at bedtime as needed for sleep. 12/09/19   Eugenie Filler, MD    Allergies    Patient has no known allergies.  Review of Systems   Review of Systems  Cardiovascular:  Positive for chest pain.  All other systems reviewed and are negative.  Physical Exam Updated Vital Signs BP (!) 179/115   Pulse 73   Temp 98.7 F (37.1 C) (Oral)   Resp 15   SpO2 100%   Physical Exam Vitals and nursing note reviewed.  Constitutional:      Appearance: He is well-developed. He is not toxic-appearing or diaphoretic.  HENT:     Head: Normocephalic and atraumatic.     Right Ear: External ear normal.     Left Ear: External ear normal.  Eyes:     General: No scleral icterus.       Right eye: No discharge.        Left eye: No discharge.     Conjunctiva/sclera: Conjunctivae normal.  Neck:     Trachea: No tracheal deviation.  Cardiovascular:     Rate and Rhythm: Normal rate and regular rhythm.  Pulmonary:     Effort: Pulmonary effort is normal. No respiratory distress.     Breath sounds: Normal breath sounds. No stridor. No wheezing or rales.  Abdominal:     General: Bowel sounds are normal. There is no distension.     Palpations: Abdomen is soft.     Tenderness: There  is no  abdominal tenderness. There is no guarding or rebound.  Genitourinary:    Comments: No melena, brown stool Musculoskeletal:        General: No tenderness or deformity.     Cervical back: Neck supple.  Skin:    General: Skin is warm and dry.     Findings: No rash.  Neurological:     General: No focal deficit present.     Mental Status: He is alert.     Cranial Nerves: No cranial nerve deficit (no facial droop, extraocular movements intact, no slurred speech).     Sensory: No sensory deficit.     Motor: No abnormal muscle tone or seizure activity.     Coordination: Coordination normal.  Psychiatric:        Mood and Affect: Mood normal.    ED Results / Procedures / Treatments   Labs (all labs ordered are listed, but only abnormal results are displayed) Labs Reviewed  BASIC METABOLIC PANEL - Abnormal; Notable for the following components:      Result Value   Glucose, Bld 297 (*)    BUN 25 (*)    Creatinine, Ser 1.76 (*)    GFR, Estimated 41 (*)    All other components within normal limits  CBC - Abnormal; Notable for the following components:   RBC 3.44 (*)    Hemoglobin 8.4 (*)    HCT 27.6 (*)    MCH 24.4 (*)    All other components within normal limits  TROPONIN I (HIGH SENSITIVITY) - Abnormal; Notable for the following components:   Troponin I (High Sensitivity) 227 (*)    All other components within normal limits  POC OCCULT BLOOD, ED    EKG EKG Interpretation  Date/Time:  Tuesday April 03 2021 06:23:20 EDT Ventricular Rate:  81 PR Interval:  141 QRS Duration: 85 QT Interval:  375 QTC Calculation: 436 R Axis:   70 Text Interpretation: Sinus rhythm Nonspecific T abnormalities, lateral leads No significant change since last tracing Confirmed by [name] [phone]) on 04/03/2021 7:17:32 AM  Radiology DG Chest 2 View  Result Date: 04/03/2021 CLINICAL DATA:  70 year old male with left side chest pain, tightness and shortness of breath. EXAM: CHEST - 2 VIEW  COMPARISON:  Portable chest 12/12/2019 and earlier. FINDINGS: Mediastinal contours are stable and within normal limits. Normal lung volumes. Prior sternotomy and postoperative changes to the left atrial appendage appear stable since 2020. Chronic coronary artery stent(s). No pneumothorax, pulmonary edema, pleural effusion or confluent pulmonary opacity. Chronic cervical ACDF and thoracic endplate spurring. No acute osseous abnormality identified. Negative visible bowel gas pattern. IMPRESSION: No acute cardiopulmonary abnormality. Electronically Signed   By: Genevie Ann M.D.   On: 04/03/2021 07:22    Procedures .Critical Care  Date/Time: 04/03/2021 8:15 AM Performed by: [name], MD Authorized by: [name], MD   Critical care provider statement:    Critical care time (minutes):  45   Critical care was time spent personally by me on the following activities:  Discussions with consultants, evaluation of patient's response to treatment, examination of patient, ordering and performing treatments and interventions, ordering and review of laboratory studies, ordering and review of radiographic studies, pulse oximetry, re-evaluation of patient's condition, obtaining history from patient or surrogate and review of old charts   Medications Ordered in ED Medications  diltiazem (CARDIZEM CD) 24 hr capsule 120 mg (has no administration in time range)  aspirin chewable tablet 324 mg (has no administration in time  range)  nitroGLYCERIN (NITROSTAT) SL tablet 0.4 mg (has no administration in time range)  nitroGLYCERIN (NITROGLYN) 2 % ointment 1 inch (has no administration in time range)    ED Course  I have reviewed the triage vital signs and the nursing notes.  Pertinent labs & imaging results that were available during my care of the patient were reviewed by me and considered in my medical decision making (see chart for details).  Clinical Course as of 04/03/21 0823  Tue Apr 03, 2021  0756 Patient's  initial troponin is elevated at 227 [JK]  0757 Hemoglobin is dropped compared to previous values [JK]  0757 Renal function is stable compared to previous [JK]  0757 Chest x-ray without acute findings [JK]  0814 Previous records reviewed.  Hospitalization last month at another medical facility for nstemi [JK]  0815 Hgb 8.4 on 6/2 [JK]  0822 Discussed with Cardiology.  Requests medical admission [JK]    Clinical Course User Index [JK] [name], MD   MDM Rules/Calculators/A&P HEAR Score: 5                        Patient presented to the ED for evaluation of recurrent chest pain.  Patient does have history of coronary artery disease.  He also admits to crack cocaine use last evening.  Patient continues have some pain and discomfort in the ED.  Patient was noted to be hypertensive on arrival.  He has not taken his blood pressure medications.  Patient also did use cocaine last evening.  Initial laboratory tests do show elevated troponins.  Concerning for possibility of NSTEMI.  Anemia noted.  Similar to previous values.  Guaiac negative here.  No signs of GI bleeding.  Patient given aspirin nitroglycerin and heparin.  I will consult cardiology for further evaluation and treatment Final Clinical Impression(s) / ED Diagnoses Final diagnoses:  NSTEMI (non-ST elevated myocardial infarction) ([place])  Anemia, unspecified type     [name], MD 04/03/21 [id number]    [name], MD 04/03/21 [phone]

## 2021-04-03 NOTE — ED Triage Notes (Signed)
Patient comes from the streets, patient used crack last night.  Patient has had increased chest pain, shortness of breath for the last 5 days.  Patient states that this was the first time in months of using crack.  Patient worsened after using.  No nausea, no vomiting.  Patient does have HA, patient has been noncompliant with medications.

## 2021-04-03 NOTE — Progress Notes (Addendum)
8:30 PM RN notified that patient still have chest pain, rates 7/10.  Pain did not improve after 3 doses of nitroglycerin.  When evaluated bedside, patient appears comfortable and in no acute distress.  He said that he has chest pain on the left side that radiates to the center where he has the scar for his CABG, pain does not radiate to the back, described pain as tightness.  States that pain is triggered with exertion and better with rest.  Endorses SOB.  Denies palpitation, diaphoresis or abdominal pain.  Chest pain has been going on for more than 1 year.  He does not know when this episode of chest pain started.  States that nitroglycerin never helped.  Patient also have tenderness with palpation of the left chest wall.  Denies falls or trauma to the chest wall.  Denies recent sickness.  BP 128/84, HR 88, O2 100% on RA  Patient have history of CAD status post CABG 2019 and RCA stents.  Patient was admitted for chest pain and cardiology was consulted.  Not a candidate for PCI and coronary stents due to ongoing cocaine use and medical nonadherence.  His atypical chest pain is likely unstable angina  vs NSTEMI.  Vasospasm from cocaine use can contribute to chest pain, with a component of costochondritis. Troponin trend 227 >216.  Repeat EKG showed nonspecific ST elevation on V1 and V2.  Will continue trending troponin and repeat EKG in 1 hour.  -Continue IV heparin, aspirin, Plavix, atorvastatin -Continue amlodipine, Imdur.  Cautious with beta-blocker in the setting of cocaine use -Trend troponin -Repeat EKG -Trial lidocaine patch

## 2021-04-03 NOTE — Progress Notes (Signed)
ANTICOAGULATION CONSULT NOTE - Follow Up Consult  Pharmacy Consult for IV Heparin Indication: chest pain/ACS  No Known Allergies  Patient Measurements: Height: 5\' 8"  (172.7 cm) Weight: 69.4 kg (153 lb) IBW/kg (Calculated) : 68.4 Heparin Dosing Weight: 69.4 kg  Vital Signs: Temp: 97.9 F (36.6 C) (06/28 1704) Temp Source: Oral (06/28 1704) BP: 149/91 (06/28 1704) Pulse Rate: 67 (06/28 1704)  Labs: Recent Labs    04/03/21 0657 04/03/21 0827 04/03/21 0836 04/03/21 1707  HGB 8.4*  --   --   --   HCT 27.6*  --   --   --   PLT 196  --   --   --   APTT  --  27  --   --   LABPROT  --  13.3  --   --   INR  --  1.0  --   --   HEPARINUNFRC  --   --   --  0.23*  CREATININE 1.76*  --   --   --   TROPONINIHS 227*  --  216*  --      Estimated Creatinine Clearance: 37.8 mL/min (A) (by C-G formula based on SCr of 1.76 mg/dL (H)).  Assessment: 70 yr old man with recent hx of cocaine use presented with CP and troponin of 227, diagnosed with [place]/NSTEMI. Pt has PMH of DM, HTN, HLD, CAD (S/P CABG in 2019), COPD, CKD stage III, and HIV. He was on Xarelto back in May 2022, but it was discontinued at OSH due to no indication for anticoagulation, per note (pt had taken previously for remote hx of PE back in 2018).  Initial heparin level ~ 8 hrs after heparin 4000 units IV bolus X 1, followed by heparin infusion at 850 units/hr, was 0.23 units/ml, which is below the goal range for this pt. H/H 8.4/27.6, plt 196. Per RN, no issues with IV or bleeding observed.  Goal of Therapy:  Heparin level 0.3-0.7 units/ml Monitor platelets by anticoagulation protocol: Yes   Plan:  Heparin 1050 units IV bolus X 1 Increase heparin infusion to 1000 units/hr Check heparin level in ~7-8 hrs Monitor daily heparin level, CBC Monitor for bleeding F/U duration of heparin infusion  Gillermina Hu, PharmD, BCPS, [facility] Clinical Pharmacist

## 2021-04-03 NOTE — H&P (Addendum)
Date: 04/03/2021               Patient Name:  Michael [name] MRN: [id number]  DOB: 02/26/1951 Age / Sex: 70 y.o., male   PCP: [name], PA-C         Medical Service: Internal Medicine Teaching Service         Attending Physician: Dr. Lucious Groves, DO    First Contact: Dr. Coy Saunas Pager: [phone]  Second Contact: Dr. Court Joy Pager: [phone]       After Hours (After 5p/  First Contact Pager: [phone]  weekends / holidays): Second Contact Pager: [phone]   Chief Complaint: Chest pain  History of Present Illness:  MichaelMichael [name] is a 70 yo M w/ PMH of cocaine use, unstable housing, CAD s/p CABG, CKD3, DM, HIV presenting to [facility] with chest pain. He was in his usual state of health until last night when he developed worsening chest pain described as pressure on left side that is constant. He mentions he continues to have chest pain with associated dyspnea, nausea and vomiting. He mentions being previously admitted for similar symptoms in the past with last admission at [facility]. He mentions that he has been abstinent from cocaine until recently when he moved to [place]. His last use of cocaine was yesterday early afternoon couple hours before onset of symptoms. He currently lives on the street and does not take any medications prescribed at discharge from his prior admission.  On review of systems, he denies any palpitations, cough, fevers, chills, diarrhea. Denies any blurry vision, headache, focal weakness, numbness or tinging.  On chart review, he had recent admission in may 2022 for similar presentation. During the admission he had LHC performed showing 70% mLcx and 99% OM lesion. 90% ostium PDA. He was deemed not appropriate candidate for PCI and was discharged with recommendation for medical treatment and cardiology f/u.  Meds:  No outpatient medications have been marked as taking for the 04/03/21 encounter [facility] Encounter).   Allergies: Allergies as of  04/03/2021   (No Known Allergies)   Past Medical History:  Diagnosis Date   A-fib ([place])    Asthma    No PFTs, history of childhood asthma   CAD (coronary artery disease)    a. 06/2013 STEMI/PCI ([facility]): LAD w/ thrombus (treated with BMS), mid 75%, D2 75%; LCX OM2 75%; RCA small, PDA 95%, PLV 95%;  b. 10/2013 Cath/PCI: ISR w/in LAD (Promus DES x 2), borderline OM2 lesion;  c. 01/2014 MV: Intermediate risk, medium-sized distal ant wall infarct w/ very small amt of peri-infarct ischemia. EF 60%.   Cellulitis 04/2014   left facial   Cellulitis and abscess of toe of right foot 12/08/2019   Chondromalacia of medial femoral condyle    Left knee MRI 04/28/12: Chondromalacia of the medial femoral condyle with slight peripheral degeneration of the meniscocapsular junction of the medial meniscus; followed by sports medicine   Collagen vascular disease ([place])    Crack cocaine use    for 20+ years, has been enrolled in detox programs in the past   Depression    with history of hospitalization for suicidal ideation   Diabetes mellitus 2002   Diagnosed in 2002, started insulin in 2012   Gout    Gout 04/28/2012   Headache(784.0)    CT head 08/2011: Periventricular and subcortical white matter hypodensities are most in keeping with chronic microangiopathic change   HIV infection [facility]) Nov 2012   Followed by  [name]   Hyperlipidemia    Hypertension    Pulmonary embolism [facility])     Family History: Brother had heart attack in his 109s  Social History: Current has no housing. [place]le[place]. Denies any alcohol, tobacco use. Had stopped using cocaine since last admission at [facility] until moving to [place] couple weeks ago and relapsing. Last use 04/02/21.  Review of Systems: A complete ROS was negative except as per HPI.   Physical Exam: Blood pressure (!) 156/87, pulse 68, temperature 98.7 F (37.1 C), temperature source Oral, resp. rate (!) 21, height 5\' 8"   (1.727 m), weight 69.4 kg, SpO2 100 %.  Gen: Well-developed, well nourished, NAD HEENT: NCAT head, hearing intact, EOMI, MMM Neck: supple, ROM intact, no JVD CV: RRR, S1, S2 normal, No rubs, no murmurs, no gallops, chest wall tenderness to palpation Pulm: CTAB, No rales, no wheezes Abd: Soft, BS+, NTND, No rebound, no guarding Extm: ROM intact, Peripheral pulses intact, no peripheral edema Skin: Dry, Warm, normal turgor, craniotomy surgical scar on chest Neuro: AAOx3  EKG: personally reviewed my interpretation is normal sinus, no ischemic changes  CXR: personally reviewed my interpretation is + craniotomy wires, no lobar consolidation, no pleural effusion, no pulmonary edema  Assessment & Plan by Problem: Active Problems:   Atypical chest pain  MichaelEscobar is a 70 yo M w/ PMH of cocaine use, unstable housing, CAD s/p CABG, CKD3, DM, HIV presenting with atypical chest pain after recent cocaine use.  Atypical Chest Pain CAD S/p CABG Cocaine Use LHC performed 02/15/21 at [facility] showing 70% mLcx and 99% OM lesion. 90% ostium PDA. Not deemed stenting candidate due to concern for adherence. Complicated by socioeconomic barriers as he has unstable housing, unable to afford meds. Cardiology on board. Currently on heparin gtt, received aspirin in ED. UDS + for cocaine. Heart score of 6. EKG w/o ischemic changes. HsTrop 227->216. Chest pain description suggest musculoskeletal etiology but has significant risk factors for ACS. Need observation admission. - Appreciate cardiology recs - C/w heparin gtt - Nitro prn - C/w aspirin - Repeat am ekg tomorrow - Avoid beta-blocker - C/w home meds: atorvastatin, Im-dur  Chronic Kidney Disease Stage 3b Baseline BUN 21, Creatinine 1.6. Admit creatinine 1.76. Similar to baseline - Trend renal fx - Avoid nephrotoxic meds when able  Normocytic Anemia Hx of anemia with prior records showing hgb ~10 at baseline. MCV 80. Admit hgb 8.4. No evidence of  bleed on admission. Likely due to renal disease, persistent HIV infection - Trend cbc - Monitor for bleed on heparin - Transfuse as needed to keep hgb >8  DM Previously prescribed insulin but does not take at home. Admit cbg 228 - SSI, Glucose checks - Hgb a1c  HIV Diagnosed in 2012. Intermittently treated. On recent admission CD4 count 410, Viral load elevated. D/ced with Odefsey - CD4, HIV viral load - C/w Odefsey - Need ID F/u  Unstable Housing Currently living [place] sleeping [place]. - Consult to transition of care  Hx of PE Remote hx in 2018. Previously treated in 2018. No evidence of PE on recent admission. Wells score 1.5 at low risk - On heparin gtt for possible ACS  DVT prophx: heparin gtt Diet: CM Bowel: N/A Code: Full  Prior to Admission Living Arrangement: Home Anticipated Discharge Location: Home Barriers to Discharge: Medical work-up  Dispo: Admit patient to Observation with expected length of stay less than 2  midnights.  Signed: Mosetta Anis, MD 04/03/2021, 10:02 AM Pager: [phone] After 5pm on weekdays and 1pm on weekends: On Call Pager: [phone]

## 2021-04-04 ENCOUNTER — Other Ambulatory Visit (HOSPITAL_COMMUNITY): Payer: Self-pay

## 2021-04-04 DIAGNOSIS — Z20822 Contact with and (suspected) exposure to covid-19: Secondary | ICD-10-CM | POA: Diagnosis not present

## 2021-04-04 DIAGNOSIS — I2510 Atherosclerotic heart disease of native coronary artery without angina pectoris: Secondary | ICD-10-CM | POA: Diagnosis not present

## 2021-04-04 DIAGNOSIS — I214 Non-ST elevation (NSTEMI) myocardial infarction: Secondary | ICD-10-CM | POA: Diagnosis not present

## 2021-04-04 DIAGNOSIS — D509 Iron deficiency anemia, unspecified: Secondary | ICD-10-CM | POA: Diagnosis present

## 2021-04-04 DIAGNOSIS — R0789 Other chest pain: Secondary | ICD-10-CM | POA: Diagnosis not present

## 2021-04-04 DIAGNOSIS — J45909 Unspecified asthma, uncomplicated: Secondary | ICD-10-CM | POA: Diagnosis not present

## 2021-04-04 LAB — PHOSPHORUS: Phosphorus: 3.3 mg/dL (ref 2.5–4.6)

## 2021-04-04 LAB — HEMOGLOBIN A1C
Hgb A1c MFr Bld: 9.6 % — ABNORMAL HIGH (ref 4.8–5.6)
Mean Plasma Glucose: 229 mg/dL

## 2021-04-04 LAB — BASIC METABOLIC PANEL
Anion gap: 4 — ABNORMAL LOW (ref 5–15)
BUN: 22 mg/dL (ref 8–23)
CO2: 23 mmol/L (ref 22–32)
Calcium: 8.6 mg/dL — ABNORMAL LOW (ref 8.9–10.3)
Chloride: 110 mmol/L (ref 98–111)
Creatinine, Ser: 1.65 mg/dL — ABNORMAL HIGH (ref 0.61–1.24)
GFR, Estimated: 44 mL/min — ABNORMAL LOW (ref >60)
Glucose, Bld: 179 mg/dL — ABNORMAL HIGH (ref 70–99)
Potassium: 3.8 mmol/L (ref 3.5–5.1)
Sodium: 137 mmol/L (ref 135–145)

## 2021-04-04 LAB — T-HELPER CELLS (CD4) COUNT (NOT AT ARMC)
CD4 % Helper T Cell: 42 % (ref 33–65)
CD4 T Cell Abs: 413 /uL (ref 400–1790)

## 2021-04-04 LAB — CBC
HCT: 23.6 % — ABNORMAL LOW (ref 39.0–52.0)
Hemoglobin: 7.2 g/dL — ABNORMAL LOW (ref 13.0–17.0)
MCH: 24 pg — ABNORMAL LOW (ref 26.0–34.0)
MCHC: 30.5 g/dL (ref 30.0–36.0)
MCV: 78.7 fL — ABNORMAL LOW (ref 80.0–100.0)
Platelets: 171 10*3/uL (ref 150–400)
RBC: 3 MIL/uL — ABNORMAL LOW (ref 4.22–5.81)
RDW: 15.1 % (ref 11.5–15.5)
WBC: 3.8 10*3/uL — ABNORMAL LOW (ref 4.0–10.5)
nRBC: 0 % (ref 0.0–0.2)

## 2021-04-04 LAB — TROPONIN I (HIGH SENSITIVITY): Troponin I (High Sensitivity): 130 ng/L (ref <18)

## 2021-04-04 LAB — GLUCOSE, CAPILLARY
Glucose-Capillary: 129 mg/dL — ABNORMAL HIGH (ref 70–99)
Glucose-Capillary: 165 mg/dL — ABNORMAL HIGH (ref 70–99)

## 2021-04-04 LAB — HIV-1 RNA QUANT-NO REFLEX-BLD
HIV 1 RNA Quant: 1220 copies/mL
LOG10 HIV-1 RNA: 3.086 log10copy/mL

## 2021-04-04 LAB — HEPARIN LEVEL (UNFRACTIONATED): Heparin Unfractionated: 0.24 IU/mL — ABNORMAL LOW (ref 0.30–0.70)

## 2021-04-04 LAB — MAGNESIUM: Magnesium: 1.7 mg/dL (ref 1.7–2.4)

## 2021-04-04 MED ORDER — ASPIRIN 81 MG PO TBEC
81.0000 mg | DELAYED_RELEASE_TABLET | Freq: Every day | ORAL | 5 refills | Status: DC
  Filled 2021-04-04: qty 60, 60d supply, fill #0

## 2021-04-04 MED ORDER — AMLODIPINE BESYLATE 10 MG PO TABS
10.0000 mg | ORAL_TABLET | Freq: Every day | ORAL | 2 refills | Status: DC
  Filled 2021-04-04: qty 60, 60d supply, fill #0
  Filled 2021-06-22: qty 60, 60d supply, fill #1

## 2021-04-04 MED ORDER — HEPARIN BOLUS VIA INFUSION
1000.0000 [IU] | Freq: Once | INTRAVENOUS | Status: AC
  Administered 2021-04-04: 1000 [IU] via INTRAVENOUS
  Filled 2021-04-04: qty 1000

## 2021-04-04 MED ORDER — ATORVASTATIN CALCIUM 40 MG PO TABS
40.0000 mg | ORAL_TABLET | Freq: Every day | ORAL | 2 refills | Status: DC
  Filled 2021-04-04: qty 60, 60d supply, fill #0
  Filled 2021-06-22: qty 60, 60d supply, fill #1

## 2021-04-04 MED ORDER — ENOXAPARIN SODIUM 40 MG/0.4ML IJ SOSY
40.0000 mg | PREFILLED_SYRINGE | Freq: Every day | INTRAMUSCULAR | Status: DC
  Administered 2021-04-04: 40 mg via SUBCUTANEOUS
  Filled 2021-04-04: qty 0.4

## 2021-04-04 MED ORDER — MAGNESIUM SULFATE 2 GM/50ML IV SOLN
2.0000 g | Freq: Once | INTRAVENOUS | Status: AC
  Administered 2021-04-04: 2 g via INTRAVENOUS
  Filled 2021-04-04: qty 50

## 2021-04-04 MED ORDER — AMLODIPINE BESYLATE 10 MG PO TABS: 10.0000 mg | ORAL_TABLET | Freq: Every day | ORAL | Status: DC

## 2021-04-04 MED ORDER — ISOSORBIDE MONONITRATE ER 60 MG PO TB24
120.0000 mg | ORAL_TABLET | Freq: Every day | ORAL | 2 refills | Status: DC
  Filled 2021-04-04: qty 60, 30d supply, fill #0
  Filled 2021-05-22: qty 60, 30d supply, fill #1
  Filled 2021-06-22: qty 60, 30d supply, fill #2

## 2021-04-04 MED ORDER — SODIUM CHLORIDE 0.9 % IV SOLN
510.0000 mg | Freq: Once | INTRAVENOUS | Status: AC
  Administered 2021-04-04: 510 mg via INTRAVENOUS
  Filled 2021-04-04: qty 17

## 2021-04-04 MED ORDER — AMLODIPINE BESYLATE 5 MG PO TABS
5.0000 mg | ORAL_TABLET | Freq: Once | ORAL | Status: AC
  Administered 2021-04-04: 5 mg via ORAL
  Filled 2021-04-04: qty 1

## 2021-04-04 MED ORDER — LIDOCAINE 5 % EX PTCH
1.0000 | MEDICATED_PATCH | Freq: Every day | CUTANEOUS | 0 refills | Status: DC | PRN
Start: 2021-04-04 — End: 2021-10-24
  Filled 2021-04-04: qty 30, 30d supply, fill #0

## 2021-04-04 MED ORDER — CLOPIDOGREL BISULFATE 75 MG PO TABS
75.0000 mg | ORAL_TABLET | Freq: Every day | ORAL | 2 refills | Status: DC
  Filled 2021-04-04: qty 60, 60d supply, fill #0

## 2021-04-04 NOTE — Progress Notes (Signed)
D/C instructions given and reviewed. Pt getting dressed, personal transport in room. Will notify when ready to transport OOF.

## 2021-04-04 NOTE — Discharge Summary (Signed)
Name: Michael [name] MRN: [id number] DOB: [birth year] 70 y.o. PCP: Tawny Asal  Date of Admission: 04/03/2021  6:14 AM Date of Discharge: 04/04/21  Attending Physician: Lucious Groves, DO  Discharge Diagnosis: 1. Atypical Chest Pain 2. Iron deficiency anemia 3. HIV  Discharge Medications: Allergies as of 04/04/2021   No Known Allergies      Medication List     STOP taking these medications    carvedilol 6.25 MG tablet Commonly known as: COREG   diltiazem 120 MG 24 hr capsule Commonly known as: CARDIZEM CD   Xarelto 20 MG Tabs tablet Generic drug: rivaroxaban       TAKE these medications    albuterol 108 (90 Base) MCG/ACT inhaler Commonly known as: VENTOLIN HFA Inhale 1-2 puffs into the lungs every 6 (six) hours as needed for wheezing or shortness of breath.   amLODipine 10 MG tablet Commonly known as: NORVASC Take 1 tablet (10 mg total) by mouth daily. Start taking on: April 05, 2021 What changed:  medication strength how much to take   aspirin 81 MG EC tablet Take 1 tablet (81 mg total) by mouth daily. Swallow whole. Start taking on: April 05, 2021   atorvastatin 40 MG tablet Commonly known as: LIPITOR Take 1 tablet (40 mg total) by mouth daily. Start taking on: April 05, 2021 What changed: when to take this   clopidogrel 75 MG tablet Commonly known as: PLAVIX Take 1 tablet (75 mg total) by mouth daily. Start taking on: April 05, 2021   famotidine 20 MG tablet Commonly known as: PEPCID Take 20 mg by mouth daily.   insulin glargine 100 UNIT/ML Solostar Pen Commonly known as: LANTUS Inject 30 Units into the skin daily.   insulin lispro 100 UNIT/ML KwikPen Commonly known as: HumaLOG KwikPen Inject 0.04 mLs (4 Units total) into the skin 3 (three) times daily with meals.   Insulin Pen Needle 31G X 5 MM Misc 4 Units by Does not apply route in the morning, at noon, and at bedtime.   isosorbide mononitrate 60 MG 24 hr  tablet Commonly known as: IMDUR Take 2 tablets (120 mg total) by mouth daily. What changed: medication strength   lidocaine 5 % Commonly known as: LIDODERM Place 1 patch onto the skin daily as needed (For chestwall pain). Remove & Discard patch within 12 hours or as directed by MD   losartan 50 MG tablet Commonly known as: COZAAR Take 50 mg by mouth daily.   mirtazapine 15 MG tablet Commonly known as: REMERON Take 15 mg by mouth at bedtime.   nitroGLYCERIN 0.4 MG SL tablet Commonly known as: NITROSTAT Place 1 tablet (0.4 mg total) under the tongue every 5 (five) minutes as needed for chest pain.   Odefsey 200-25-25 MG Tabs tablet Generic drug: emtricitabine-rilpivir-tenofovir AF Take 1 tablet by mouth daily.   sertraline 50 MG tablet Commonly known as: ZOLOFT Take 1 tablet (50 mg total) by mouth daily. For depression   traZODone 100 MG tablet Commonly known as: DESYREL Take 1 tablet (100 mg total) by mouth at bedtime as needed for sleep.        Disposition and follow-up:   Michael [name] was discharged from [facility] in [place] condition.  At the hospital follow up visit please address:  1.  Atypical Chest Pain - Ensure he continues his aspirin, clopidogrel and Im-dur - Counsel against cocaine use - Check bmp  2. IDA - Given iron infusion w/  hgb at 7.2 during admission - Check cbc, iron panel and provide repletion as needed  3. HIV - HIV + with CD4 count 400 - F/u viral load - Refer to ID to start tx  2.  Labs / imaging needed at time of follow-up: cbc, bmp  3.  Pending labs/ test needing follow-up: HIV viral load  Follow-up Appointments:  Follow-up Information     [name], PA-C. Go on 05/04/2021.   Specialty: Physician Assistant Why: @9 ;30am Contact information: [place] [address] [phone]         [name], MD .   Specialty: Infectious Diseases Contact information: [address] [place] [address] [place] Hospital Course by problem list:  Atypical Chest Pain: Michael [name] is a 70 y.o. male with PMH of cocaine use, unstable housing, CAD s/p CABG, CKD3, DM, HIV presenting with atypical chest pain. He was noted to have mildly elevated troponin without ecg changes. He was started on heparin. His UDS was found to be positive for cocaine. Cardiology was consulted but felt he was poor candidate for invasive procedure due to social situation with history of non-adherence. He was restarted back on dual anti-platelet therapy and discharged after repeat ecg showed no further ischemic changes and troponins remained flat.  2. Iron deficiency Anemia: Noted to have hemoglobin of 7.2 during admission. Felt to be iron deficiency based on labwork from recent admission at [facility]. Given dose of IV iron infusion and discharged with recommendation to f/u outpatient.  3. HIV: Noted to have hx of HIV. CD4 count was 400. No evidence of infection during admission. Restarted on home anti-virals. Discharged with recommendation to f/u with ID outpatient.  Discharge Subjective Patient evaluated at bedside. Patient resting in bed on approach. He denies any worsening chest pain but states he has always had some baseline chest pain for a year. States he was able to sleep throughout the night after a lidocaine patch was applied. Denies any shortness of breath, nausea, vomiting, abdominal pain or leg pain.   Discharge Exam:   BP 137/84 (BP Location: Left Arm)   Pulse 89   Temp 99.1 F (37.3 C) (Oral)   Resp 16   Ht 5\' 8"  (1.727 m)   Wt 72.5 kg   SpO2 100%   BMI 24.30 kg/m  General: Pleasant, well-appearing elderly man laying in bed. No NAD.  Head: Normocephalic. Atraumatic. CV: RRR. No m/r/g. No LE edema Pulmonary: Lungs CTAB. Normal effort. No wheezing or rales. Abdominal: Soft, nontender, nondistended. Normal bowel  sounds. Extremities: 2+ distal pulses. Normal ROM. Skin: Warm and dry Neuro: A&Ox3 Psych: Normal mood and affect  Pertinent Labs, Studies, and Procedures:  CBC Latest Ref Rng & Units 04/04/2021 04/03/2021 12/12/2019  WBC 4.0 - 10.5 K/uL 3.8(L) 4.8 5.3  Hemoglobin 13.0 - 17.0 g/dL 7.2(L) 8.4(L) 10.1(L)  Hematocrit 39.0 - 52.0 % 23.6(L) 27.6(L) 31.6(L)  Platelets 150 - 400 K/uL 171 196 159   BMP Latest Ref Rng & Units 04/04/2021 04/03/2021 12/12/2019  Glucose 70 - 99 mg/dL 179(H) 297(H) 356(H)  BUN 8 - 23 mg/dL 22 25(H) 48(H)  Creatinine 0.61 - 1.24 mg/dL 1.65(H) 1.76(H) 2.24(H)  BUN/Creat Ratio 6 - 22 (calc) - - -  Sodium 135 - 145 mmol/L 137 139 132(L)  Potassium 3.5 - 5.1 mmol/L 3.8 3.9 4.4  Chloride 98 -  111 mmol/L 110 103 98  CO2 22 - 32 mmol/L 23 23 21(L)  Calcium 8.9 - 10.3 mg/dL 8.6(L) 9.2 8.5(L)   DG Chest 2 View  Result Date: 04/03/2021 CLINICAL DATA:  70 year old male with left side chest pain, tightness and shortness of breath. EXAM: CHEST - 2 VIEW COMPARISON:  Portable chest 12/12/2019 and earlier. FINDINGS: Mediastinal contours are stable and within normal limits. Normal lung volumes. Prior sternotomy and postoperative changes to the left atrial appendage appear stable since 2020. Chronic coronary artery stent(s). No pneumothorax, pulmonary edema, pleural effusion or confluent pulmonary opacity. Chronic cervical ACDF and thoracic endplate spurring. No acute osseous abnormality identified. Negative visible bowel gas pattern. IMPRESSION: No acute cardiopulmonary abnormality. Electronically Signed   By: Genevie Ann M.D.   On: 04/03/2021 07:22      Discharge Instructions: Discharge Instructions     Diet - low sodium heart healthy   Complete by: As directed    Increase activity slowly   Complete by: As directed      Michael [name], It was a pleasure taking care of you at [place] were admitted for chest pain but it does not look like you had a heart attack. Your chest  pain is likely due to the cocaine use. We are discharging you home now that you are doing better. Please follow the following instructions.  1) It is very important that you stop using cocaine or other illicit drugs due to the multiple blockages in your heart vessels.  2) Start taking aspirin 81 mg, Lipitor 40 mg and Plavix 75 mg daily  3) Take all your medications as prescribed. 3) Follow-up with your primary care doctor and the infectious disease clinic  Take care,  Dr. Linwood Dibbles, MD, MPH  Signed: Mosetta Anis, MD 04/04/2021, 3:37 PM Pager: [phone] After 5pm on weekdays and 1pm on weekends: On Call Pager: [phone]

## 2021-04-04 NOTE — Progress Notes (Signed)
Subjective:   Hospital day:1  Overnight event: No acute events overnight  Patient evaluated at bedside. Patient resting in bed on approach. He denies any worsening chest pain but states he has always had some baseline chest pain for a year. States he was able to sleep throughout the night after a lidocaine patch was applied. Denies any shortness of breath, nausea, vomiting, abdominal pain or leg pain.  Objective:  Vital signs in last 24 hours: Vitals:   04/03/21 2040 04/04/21 0032 04/04/21 0357 04/04/21 0842  BP: 128/84 139/79 (!) 148/91 (!) 162/96  Pulse: 88 95 81 80  Resp:  16 16   Temp:  98.7 F (37.1 C) 99.1 F (37.3 C)   TempSrc:  Oral Oral   SpO2: 100% 100% 100% 100%  Weight:   72.5 kg   Height:        Filed Weights   04/03/21 0800 04/04/21 0357  Weight: 69.4 kg 72.5 kg     Intake/Output Summary (Last 24 hours) at 04/04/2021 1043 Last data filed at 04/04/2021 0842 Gross per 24 hour  Intake 450.08 ml  Output 1900 ml  Net -1449.92 ml   Net IO Since Admission: -1,849.92 mL [04/04/21 1045]  Recent Labs    04/03/21 1707 04/03/21 2032 04/04/21 0615  GLUCAP 143* 190* 129*     Pertinent Labs: CBC Latest Ref Rng & Units 04/04/2021 04/03/2021 12/12/2019  WBC 4.0 - 10.5 K/uL 3.8(L) 4.8 5.3  Hemoglobin 13.0 - 17.0 g/dL 7.2(L) 8.4(L) 10.1(L)  Hematocrit 39.0 - 52.0 % 23.6(L) 27.6(L) 31.6(L)  Platelets 150 - 400 K/uL 171 196 159    CMP Latest Ref Rng & Units 04/04/2021 04/03/2021 12/12/2019  Glucose 70 - 99 mg/dL 179(H) 297(H) 356(H)  BUN 8 - 23 mg/dL 22 25(H) 48(H)  Creatinine 0.61 - 1.24 mg/dL 1.65(H) 1.76(H) 2.24(H)  Sodium 135 - 145 mmol/L 137 139 132(L)  Potassium 3.5 - 5.1 mmol/L 3.8 3.9 4.4  Chloride 98 - 111 mmol/L 110 103 98  CO2 22 - 32 mmol/L 23 23 21(L)  Calcium 8.9 - 10.3 mg/dL 8.6(L) 9.2 8.5(L)  Total Protein 6.5 - 8.1 g/dL - - 7.0  Total Bilirubin 0.3 - 1.2 mg/dL - - 0.6  Alkaline Phos 38 - 126 U/L - - 65  AST 15 - 41 U/L - - 32  ALT 0 - 44 U/L -  - 13    Imaging: DG Chest 2 View  Result Date: 04/03/2021 CLINICAL DATA:  70 year old male with left side chest pain, tightness and shortness of breath. EXAM: CHEST - 2 VIEW COMPARISON:  Portable chest 12/12/2019 and earlier. FINDINGS: Mediastinal contours are stable and within normal limits. Normal lung volumes. Prior sternotomy and postoperative changes to the left atrial appendage appear stable since 2020. Chronic coronary artery stent(s). No pneumothorax, pulmonary edema, pleural effusion or confluent pulmonary opacity. Chronic cervical ACDF and thoracic endplate spurring. No acute osseous abnormality identified. Negative visible bowel gas pattern. IMPRESSION: No acute cardiopulmonary abnormality. Electronically Signed   By: Genevie Ann M.D.   On: 04/03/2021 07:22    Physical Exam  General: Pleasant, well-appearing elderly man laying in bed. No NAD.  Head: Normocephalic. Atraumatic. CV: RRR. No m/r/g. No LE edema Pulmonary: Lungs CTAB. Normal effort. No wheezing or rales. Abdominal: Soft, nontender, nondistended. Normal bowel sounds. Extremities: 2+ distal pulses. Normal ROM. Skin: Warm and dry. No obvious rash or lesions. Neuro: A&Ox3. No focal deficts Psych: Normal mood and affect  Assessment/Plan: Michael [name] is a 70  y.o. male with PMH of cocaine use, unstable housing, CAD s/p CABG, CKD3, DM, HIV presenting with atypical chest pain after recent cocaine use.  Active Problems:   Atypical chest pain  Atypical Chest Pain CAD S/p CABG Cocaine Use Chest pain improved after lidocaine patch placement last night. Troponins trending down 149->130. Patient counseled on the importance of abstinence from cocaine in the setting of his significant cardiac history. --Cardiology signed off --Discontinue IV heparin, start Lovenox for VTE ppx --Continue lidocaine patch --Continue ASA and Plavix --Continue amlodipine 5 daily --Continue Imdur 120 mg daily --PRN nitro   CKD stage  3b Baseline BUN 21, Creatinine 1.6. Admit creatinine 1.76. Creatinine improved to 1.65. --Daily BMP --Avoid nephrotoxic agents   Normocytic Anemia IDA Hx of anemia with prior records showing hgb ~10 at baseline. MCV 80. Admit hgb 8.4. Patient with history of iron deficiency anemia previously on iron supplementation. Hemoglobin down to 7.2.  --IV Feraheme x1 dose --Daily CBC --Heparin discontinued  Hypertension BP elevated to SBP in the 140s to 160s overnight this morning. Partially due to recent cocaine use. Diltiazem discontinued by cardiology.  --Will increase amlodipine to 10 mg daily --Continue Imdur 120 mg daily --Daily vitals   Type 2 diabetes  A1C improved to 9.6 on admission from 13.0 one year ago.  Patient was previously on insulin but does not take it at home. CBG down to 129 this morning. Will continue to monitor sugars closely.  --Continue SSI --CBG monitoring   HIV Diagnosed in 2012. Intermittently treated. CD4 count improved to 413. --HIV viral load pending --Continue Odefsey --ID follow-up in the outpatient  Unstable Housing Currently living [place] sleeping [place] court [place]. --TOC consulted, appreciate assistance with disposition --Shelter and substance abuse resources provided by CSW --Patient states he will get his check tomorrow and will be able to get resources he needs.  Hx of PE Remote hx in 2018. Previously treated in 2018. No evidence of PE on recent admission. Wells score 1.5 at low risk.  --Discontinue heparin per cardiology rec  Diet: HH/CM IVF: None VTE: Lovenox CODE: Full Code  Prior to Admission Living Arrangement: Unhomed Anticipated Discharge Location: Pending Barriers to Discharge: Deposition Dispo: Anticipated discharge tomorrow.  Signed: Lacinda Axon, MD 04/04/2021, 6:26 AM  Pager: [phone] Internal Medicine Teaching Service After 5pm on weekdays and 1pm on weekends: On Call pager: [phone]

## 2021-04-04 NOTE — Plan of Care (Signed)

## 2021-04-04 NOTE — TOC Transition Note (Signed)
Transition of Care [facility]) - CM/SW Discharge Note   Patient Details  Name: Michael [name] MRN: [id number] Date of Birth: 30-Sep-1951  Transition of Care [facility]) CM/SW Contact:  Zenon Mayo, RN Phone Number: 04/04/2021, 3:05 PM   Clinical Narrative:    Patient is for dc today, NCM gave him resources for SA, and Shelters. He has transport home.   Final next level of care: Home/Self Care Barriers to Discharge: No Barriers Identified   Patient Goals and CMS Choice Patient states their goals for this hospitalization and ongoing recovery are:: return home   Choice offered to / list presented to : NA  Discharge Placement                       Discharge Plan and Services                  DME Agency: NA       HH Arranged: NA          Social Determinants of Health (SDOH) Interventions     Readmission Risk Interventions Readmission Risk Prevention Plan 12/09/2019 07/19/2019  Transportation Screening Complete Complete  Medication Review Press photographer) Complete Complete  PCP or Specialist appointment within 3-5 days of discharge Complete -  [place] or Home Care Consult Complete Complete  SW Recovery Care/Counseling Consult Complete -  Palliative Care Screening Not Applicable Not [place] Not Applicable -  Some recent data might be hidden

## 2021-04-04 NOTE — Discharge Instructions (Signed)
Mr. Gunnels, It was a pleasure taking care of you at [place] were admitted for chest pain but it does not look like you had a heart attack. Your chest pain is likely due to the cocaine use. We are discharging you home now that you are doing better. Please follow the following instructions.  1) It is very important that you stop using cocaine or other illicit drugs due to the multiple blockages in your heart vessels.  2) Start taking aspirin 81 mg, Lipitor 40 mg and Plavix 75 mg daily  3) Take all your medications as prescribed. 3) Follow-up with your primary care doctor and the infectious disease clinic  Take care,  Dr. Linwood Dibbles, MD, MPH

## 2021-04-04 NOTE — Progress Notes (Signed)
ANTICOAGULATION CONSULT NOTE - Follow Up Consult  Pharmacy Consult for IV Heparin Indication: chest pain/ACS  No Known Allergies  Patient Measurements: Height: 5\' 8"  (172.7 cm) Weight: 72.5 kg (159 lb 13.3 oz) IBW/kg (Calculated) : 68.4 Heparin Dosing Weight: 69.4 kg  Vital Signs: Temp: 99.1 F (37.3 C) (06/29 0357) Temp Source: Oral (06/29 0357) BP: 148/91 (06/29 0357) Pulse Rate: 81 (06/29 0357)  Labs: Recent Labs    04/03/21 0657 04/03/21 0827 04/03/21 0836 04/03/21 1707 04/03/21 2117 04/03/21 2250 04/04/21 0333  HGB 8.4*  --   --   --   --   --  7.2*  HCT 27.6*  --   --   --   --   --  23.6*  PLT 196  --   --   --   --   --  171  APTT  --  27  --   --   --   --   --   LABPROT  --  13.3  --   --   --   --   --   INR  --  1.0  --   --   --   --   --   HEPARINUNFRC  --   --   --  0.23*  --   --  0.24*  CREATININE 1.76*  --   --   --   --   --   --   TROPONINIHS 227*  --  216*  --  149* 130*  --      Estimated Creatinine Clearance: 37.8 mL/min (A) (by C-G formula based on SCr of 1.76 mg/dL (H)).  Assessment: 70 yr old man with recent hx of cocaine use presented with CP and troponin of 227, diagnosed with USA/NSTEMI. Pt has PMH of DM, HTN, HLD, CAD (S/P CABG in 2019), COPD, CKD stage III, and HIV. He was on Xarelto back in May 2022, but it was discontinued at OSH due to no indication for anticoagulation, per note (pt had taken previously for remote hx of PE back in 2018).  Heparin level subtherapeutic (0.24) on gtt at 1000 units/hr. No issues with line or bleeding reported per RN. Hgb down to 7.2 (8.4 on admission).  Goal of Therapy:  Heparin level 0.3-0.7 units/ml Monitor platelets by anticoagulation protocol: Yes   Plan:  Rebolus heparin 1000 units IV Increase heparin gtt to 1150 units/hr F/u 8 hr heparin level  Sherlon Handing, PharmD, BCPS Please see amion for complete clinical pharmacist phone list 04/04/2021 5:05 AM

## 2021-04-05 ENCOUNTER — Telehealth: Payer: Self-pay

## 2021-04-06 NOTE — Telephone Encounter (Signed)
Transition Care Management Unsuccessful Follow-up Telephone Call  Date of discharge and from where:  [name] on 04/04/2021  Attempts:  2nd Attempt  Reason for unsuccessful TCM follow-up call:  Unable to reach patient at any of listed phone numbers. Reached son and sister , both stated they do not know where the pt is at this time or have any contact number. Last time they spoke were sometime in March. Instructed if pt reach any of them to give this nurse name and contact nr.

## 2021-04-10 ENCOUNTER — Telehealth: Payer: Self-pay

## 2021-04-10 NOTE — Telephone Encounter (Signed)
Transition Care Management Unsuccessful Follow-up Telephone Call  Date of discharge and from where:  04/04/2021, [facility] He has been to ED - 7/2 and 04/09/2021.   Attempts:  3rd Attempt  Reason for unsuccessful TCM follow-up call:  Left voice message on # [phone].  Patient has appointment with Juluis Mire, NP at [place] 05/04/2021.

## 2021-04-11 ENCOUNTER — Other Ambulatory Visit: Payer: Self-pay

## 2021-04-11 ENCOUNTER — Inpatient Hospital Stay: Payer: Medicare Other | Admitting: Infectious Diseases

## 2021-04-16 DIAGNOSIS — Z951 Presence of aortocoronary bypass graft: Secondary | ICD-10-CM | POA: Insufficient documentation

## 2021-04-24 ENCOUNTER — Other Ambulatory Visit (HOSPITAL_COMMUNITY): Payer: Self-pay

## 2021-04-25 ENCOUNTER — Other Ambulatory Visit (HOSPITAL_COMMUNITY)
Admission: RE | Admit: 2021-04-25 | Discharge: 2021-04-25 | Disposition: A | Discharge status: Home / Self Care | Payer: Medicare Other | Source: Ambulatory Visit | Attending: Internal Medicine | Admitting: Internal Medicine

## 2021-04-25 ENCOUNTER — Other Ambulatory Visit: Payer: Self-pay

## 2021-04-25 ENCOUNTER — Ambulatory Visit: Payer: Medicare Other

## 2021-04-25 ENCOUNTER — Encounter: Payer: Self-pay | Admitting: Internal Medicine

## 2021-04-25 ENCOUNTER — Ambulatory Visit (INDEPENDENT_AMBULATORY_CARE_PROVIDER_SITE_OTHER): Payer: Medicare Other | Admitting: Internal Medicine

## 2021-04-25 ENCOUNTER — Other Ambulatory Visit (HOSPITAL_COMMUNITY): Payer: Self-pay

## 2021-04-25 ENCOUNTER — Ambulatory Visit (INDEPENDENT_AMBULATORY_CARE_PROVIDER_SITE_OTHER): Payer: Medicare Other | Admitting: Pharmacist

## 2021-04-25 VITALS — BP 162/81 | HR 44 | Temp 97.7°F | Wt 160.0 lb

## 2021-04-25 DIAGNOSIS — M86271 Subacute osteomyelitis, right ankle and foot: Secondary | ICD-10-CM | POA: Diagnosis not present

## 2021-04-25 DIAGNOSIS — E1122 Type 2 diabetes mellitus with diabetic chronic kidney disease: Secondary | ICD-10-CM | POA: Insufficient documentation

## 2021-04-25 DIAGNOSIS — N1831 Chronic kidney disease, stage 3a: Secondary | ICD-10-CM

## 2021-04-25 DIAGNOSIS — Z794 Long term (current) use of insulin: Secondary | ICD-10-CM | POA: Insufficient documentation

## 2021-04-25 DIAGNOSIS — B20 Human immunodeficiency virus [HIV] disease: Secondary | ICD-10-CM

## 2021-04-25 DIAGNOSIS — N1832 Chronic kidney disease, stage 3b: Secondary | ICD-10-CM

## 2021-04-25 DIAGNOSIS — E11621 Type 2 diabetes mellitus with foot ulcer: Secondary | ICD-10-CM

## 2021-04-25 DIAGNOSIS — Z21 Asymptomatic human immunodeficiency virus [HIV] infection status: Secondary | ICD-10-CM

## 2021-04-25 DIAGNOSIS — L97519 Non-pressure chronic ulcer of other part of right foot with unspecified severity: Secondary | ICD-10-CM

## 2021-04-25 MED ORDER — BICTEGRAVIR-EMTRICITAB-TENOFOV 50-200-25 MG PO TABS
1.0000 | ORAL_TABLET | Freq: Every day | ORAL | 11 refills | Status: DC
  Filled 2021-04-25 (×2): qty 30, 30d supply, fill #0
  Filled 2021-05-22: qty 30, 30d supply, fill #1
  Filled 2021-06-22: qty 30, 30d supply, fill #2

## 2021-04-25 NOTE — Progress Notes (Signed)
HPI: Michael [name] is a 70 y.o. male who presents to the [facility] clinic for HIV follow-up.  Patient Active Problem List   Diagnosis Date Noted   Iron deficiency anemia 04/04/2021   Atypical chest pain 04/03/2021   Homelessness 12/13/2019   Cellulitis 12/07/2019   Osteomyelitis ([place]) 07/17/2019   Abscess or cellulitis of toe, right    Toe pain, right-second    MDD (major depressive disorder), recurrent episode, severe ([place]) 06/15/2019   Respiratory distress 04/12/2019   Pneumonia due to severe acute respiratory syndrome coronavirus 2 (SARS-CoV-2) 04/11/2019   COVID-19 virus infection 03/19/2019   Chest pain 10/03/2018   Malnutrition of moderate degree 09/21/2018   MDD (major depressive disorder), recurrent episode, moderate ([name])    Type II diabetes mellitus with renal manifestations ([place]) 05/04/2018   GERD (gastroesophageal reflux disease) 05/04/2018   S/P CABG (coronary artery bypass graft)    Left testicular pain    Depression 02/20/2018   Epididymo-orchitis, acute 02/20/2018   Essential hypertension 02/20/2018   Precordial chest pain 02/20/2018   AKI (acute kidney injury) ([place]) 02/14/2018   HCAP (healthcare-associated pneumonia) 02/14/2018   History of pulmonary embolism 07/04/2017   Acute hyponatremia 05/11/2017   Hyperglycemia due to type 2 diabetes mellitus ([place]) 05/11/2017   CHF (congestive heart failure) ([place]) 04/01/2017   Hepatitis C 12/30/2016   Chronic ischemic heart disease 11/12/2016   Paroxysmal A-fib ([place]) 11/12/2016   Back pain 04/18/2016   S/P carotid endarterectomy 11/15/2015   MDD (major depressive disorder), recurrent severe, without psychosis ([place]) 09/09/2015   Cocaine-induced mood disorder ([place]) 08/14/2015   Cocaine abuse with cocaine-induced mood disorder ([place]) 08/14/2015   Gout 07/10/2015   Acute renal failure superimposed on stage 3 chronic kidney disease ([place]) 03/06/2015   Anemia 03/06/2015   Chronic kidney disease, stage III  (moderate) ([place]) 03/06/2015   Hypoglycemia    Encounter for general adult medical examination with abnormal findings 02/09/2015   Cocaine use disorder, severe, dependence ([place]) 12/13/2014   Substance or medication-induced depressive disorder with onset during withdrawal ([place]) 12/13/2014   Severe recurrent major depressive disorder with psychotic features ([place]) 12/12/2014   Cervicalgia 06/28/2014   Lumbar radiculopathy, chronic 06/28/2014   Asthma, chronic 02/03/2014   3-vessel CAD 06/24/2013   ED (erectile dysfunction) of organic origin 07/07/2012   Hypertension goal BP (blood pressure) < 140/80 04/29/2012   Chondromalacia of left knee 03/19/2012   Hyperlipidemia with target LDL less than 100 02/12/2012   Fibromyalgia 02/12/2012   HIV (human immunodeficiency virus infection) ([place]) 08/27/2011   Uncontrolled type 2 diabetes with neuropathy ([place]) 10/17/2000    Patient's Medications  New Prescriptions   No medications on file  Previous Medications   ALBUTEROL (VENTOLIN HFA) 108 (90 BASE) MCG/ACT INHALER    Inhale 1-2 puffs into the lungs every 6 (six) hours as needed for wheezing or shortness of breath.   ALLOPURINOL (ZYLOPRIM) 100 MG TABLET    Take by mouth.   AMLODIPINE (NORVASC) 10 MG TABLET    Take 1 tablet (10 mg total) by mouth daily.   ATORVASTATIN (LIPITOR) 40 MG TABLET    Take 1 tablet (40 mg total) by mouth daily.   BICTEGRAVIR-EMTRICITABINE-TENOFOVIR AF (BIKTARVY) 50-200-25 MG TABS TABLET    Take 1 tablet by mouth daily.   BUPROPION (WELLBUTRIN XL) 150 MG 24 HR TABLET    Take 150 mg by mouth every morning.   CARVEDILOL (COREG) 6.25 MG TABLET    Take by mouth.   DOXYCYCLINE (MONODOX)  100 MG CAPSULE    Take 100 mg by mouth 2 (two) times daily.   ESCITALOPRAM (LEXAPRO) 10 MG TABLET    Take 10 mg by mouth daily.   FAMOTIDINE (PEPCID) 20 MG TABLET    Take 20 mg by mouth daily.   INSULIN GLARGINE (LANTUS) 100 UNIT/ML SOLOSTAR PEN    Inject 30 Units into the skin daily.   INSULIN  LISPRO (HUMALOG KWIKPEN) 100 UNIT/ML KWIKPEN    Inject 0.04 mLs (4 Units total) into the skin 3 (three) times daily with meals.   INSULIN PEN NEEDLE 31G X 5 MM MISC    4 Units by Does not apply route in the morning, at noon, and at bedtime.   ISOSORBIDE MONONITRATE (IMDUR) 60 MG 24 HR TABLET    Take 2 tablets (120 mg total) by mouth daily.   LIDOCAINE (LIDODERM) 5 %    Place 1 patch onto the skin daily as needed (For chestwall pain). Remove & Discard patch within 12 hours or as directed by MD   LOSARTAN (COZAAR) 50 MG TABLET    Take 50 mg by mouth daily.   MIRTAZAPINE (REMERON) 15 MG TABLET    Take by mouth.   NITROGLYCERIN (NITROSTAT) 0.4 MG SL TABLET    Place 1 tablet (0.4 mg total) under the tongue every 5 (five) minutes as needed for chest pain.   SYMBICORT 80-4.5 MCG/ACT INHALER    Inhale into the lungs.   XARELTO 15 MG TABS TABLET    Take 15 mg by mouth daily.  Modified Medications   No medications on file  Discontinued Medications   No medications on file    Allergies: No Known Allergies  Past Medical History: Past Medical History:  Diagnosis Date   A-fib ([place])    Asthma    No PFTs, history of childhood asthma   CAD (coronary artery disease)    a. 06/2013 STEMI/PCI ([facility]): LAD w/ thrombus (treated with BMS), mid 75%, D2 75%; LCX OM2 75%; RCA small, PDA 95%, PLV 95%;  b. 10/2013 Cath/PCI: ISR w/in LAD (Promus DES x 2), borderline OM2 lesion;  c. 01/2014 MV: Intermediate risk, medium-sized distal ant wall infarct w/ very small amt of peri-infarct ischemia. EF 60%.   Cellulitis 04/2014   left facial   Cellulitis and abscess of toe of right foot 12/08/2019   Chondromalacia of medial femoral condyle    Left knee MRI 04/28/12: Chondromalacia of the medial femoral condyle with slight peripheral degeneration of the meniscocapsular junction of the medial meniscus; followed by sports medicine   Collagen vascular disease ([place])    Crack cocaine use    for 20+ years, has been enrolled in detox  programs in the past   Depression    with history of hospitalization for suicidal ideation   Diabetes mellitus 2002   Diagnosed in 2002, started insulin in 2012   Gout    Gout 04/28/2012   Headache(784.0)    CT head 08/2011: Periventricular and subcortical white matter hypodensities are most in keeping with chronic microangiopathic change   HIV infection [facility]) Nov 2012   Followed by [name]   Hyperlipidemia    Hypertension    Pulmonary embolism [facility])     Social History: Social History   Socioeconomic History   Marital status: Widowed    Spouse name: Not on file   Number of children: 2   Years of education: 53   Highest education level: 12th grade  Occupational History    Employer:  UNEMPLOYED    Comment: 04/2016  Tobacco Use   Smoking status: Never   Smokeless tobacco: Never  Vaping Use   Vaping Use: Never used  Substance and Sexual Activity   Alcohol use: No    Alcohol/week: 4.0 standard drinks    Types: 2 Cans of beer, 2 Shots of liquor per week   Drug use: Not Currently    Frequency: 4.0 times per week    Types: "Crack" cocaine, Cocaine    Comment: Recent use of crack   Sexual activity: Yes    Comment: DECLINED CONDOMS  Other Topics Concern   Not on file  Social History Narrative   Currently staying with a friend in [place].  Was staying @ local motel until a few days ago - left b/c of bed bugs.   [facility]  Not followed by a psychiatrist.   Social Determinants of Health   Financial Resource Strain: Not on file  Food Insecurity: Not on file  Transportation Needs: Not on file  Physical Activity: Not on file  Stress: Not on file  Social Connections: Not on file    Labs: Lab Results  Component Value Date   HIV1RNAQUANT 1,220 04/03/2021   HIV1RNAQUANT 360 09/16/2018   HIV1RNAQUANT <20 10/14/2017   CD4TABS 413 04/03/2021   CD4TABS 270 (L) 09/16/2018   CD4TABS 630 10/14/2017    RPR and STI Lab Results  Component Value Date    LABRPR NON-REACTIVE 10/14/2017   LABRPR NON REAC 01/08/2016   LABRPR NON REAC 03/20/2015   LABRPR NON REAC 04/07/2013   LABRPR NON REAC 12/10/2011    STI Results GC CT  02/20/2018 Negative Negative  03/20/2015 Negative Negative    Hepatitis B Lab Results  Component Value Date   HEPBSAB NEG 01/08/2016   HEPBSAG NEGATIVE 10/18/2011   HEPBCAB NEG 10/18/2011   Hepatitis C No results found for: HEPCAB, HCVRNAPCRQN Hepatitis A Lab Results  Component Value Date   HAV NEG 10/18/2011   Lipids: Lab Results  Component Value Date   CHOL 196 12/09/2019   TRIG 134 12/09/2019   HDL 49 12/09/2019   CHOLHDL 4.0 12/09/2019   VLDL 27 12/09/2019   LDLCALC 120 (H) 12/09/2019    Current HIV Regimen: Odefsey  Assessment: Kirubel is here today to follow up for his HIV infection. He is currently taking Odefsey, but Dr. Juleen China will change him to [facility] today.   Explained that [place] Odor is a one pill once daily medication with or without food and the importance of not missing any doses. Explained resistance and how it develops and why it is so important to take Biktarvy daily and not skip days or doses. Counseled patient to take it around the same time each day. Counseled on what to do if dose is missed, if closer to missed dose take immediately, if closer to next dose then skip and resume normal schedule.   Cautioned on possible side effects the first week or so including nausea, diarrhea, dizziness, and headaches but that they should resolve after the first couple of weeks. I reviewed patient medications and found no drug interactions. Counseled patient to separate Biktarvy from divalent cations including multivitamins. Discussed with patient to call clinic if he starts a new medication or herbal supplement. I gave the patient my card and told him to call me with any issues/questions/concerns.  He would like his medications mailed so will have Dr. Juleen China send to [facility]  They will call his  other pharmacy to get the rest of his medications transferred so that he can have all of them mailed as well.   Plan: - Stop [employer] - Start [employer] - Mail from [place]. Meranda Dechaine, PharmD, BCIDP, AAHIVP, CPP Clinical Pharmacist Practitioner [facility] 04/25/2021, 1:46 PM

## 2021-04-25 NOTE — Patient Instructions (Addendum)
Thank you for coming to see me today. It was a pleasure seeing you.  To Do: MRI of your right toes. Based on this result, will need to get you set up for surgery referral and possibly antibiotics. Labs today Stop taking Odefsey for HIV and Start taking Biktarvy once you receive it. See me in 4 weeks  If you have any questions or concerns, please do not hesitate to call the office at [phone].  Take Care,   Jule Ser, DO

## 2021-04-25 NOTE — Progress Notes (Signed)
[place] for Infectious Disease  Reason for Consult: HIV  Referring Provider: [name], PA   HPI:    Michael [name] is a 70 y.o. male with a past medical history as below who presents to clinic as a re-establishing patient for HIV care.  He has not been seen since 10/2017 at which time he was on [facility].  He has been off and on ART for a few  years and recent HIV RNA 04/03/21 was 1220 copies, CD4 count 413 (42%).  He has no prior genotypes on file that I can see. He was recently admitted for atypical chest pain due to cocaine use and resumed back on Odefsey but has not been taking it until starting it back today.  He saw his new PCP yesterday.  He also has a right great toe DFU that he is currently taking doxycyline for.  There is a lot of pain from this per his report but no drainage currently and no fevers or chills.      Patient's Medications  New Prescriptions   BICTEGRAVIR-EMTRICITABINE-TENOFOVIR AF (BIKTARVY) 50-200-25 MG TABS TABLET    Take 1 tablet by mouth daily.  Previous Medications   ALBUTEROL (VENTOLIN HFA) 108 (90 BASE) MCG/ACT INHALER    Inhale 1-2 puffs into the lungs every 6 (six) hours as needed for wheezing or shortness of breath.   ALLOPURINOL (ZYLOPRIM) 100 MG TABLET    Take by mouth.   AMLODIPINE (NORVASC) 10 MG TABLET    Take 1 tablet (10 mg total) by mouth daily.   ATORVASTATIN (LIPITOR) 40 MG TABLET    Take 1 tablet (40 mg total) by mouth daily.   BUPROPION (WELLBUTRIN XL) 150 MG 24 HR TABLET    Take 150 mg by mouth every morning.   CARVEDILOL (COREG) 6.25 MG TABLET    Take by mouth.   DOXYCYCLINE (MONODOX) 100 MG CAPSULE    Take 100 mg by mouth 2 (two) times daily.   ESCITALOPRAM (LEXAPRO) 10 MG TABLET    Take 10 mg by mouth daily.   FAMOTIDINE (PEPCID) 20 MG TABLET    Take 20 mg by mouth daily.   INSULIN GLARGINE (LANTUS) 100 UNIT/ML SOLOSTAR PEN    Inject 30 Units into the skin daily.   INSULIN LISPRO (HUMALOG KWIKPEN) 100  UNIT/ML KWIKPEN    Inject 0.04 mLs (4 Units total) into the skin 3 (three) times daily with meals.   INSULIN PEN NEEDLE 31G X 5 MM MISC    4 Units by Does not apply route in the morning, at noon, and at bedtime.   ISOSORBIDE MONONITRATE (IMDUR) 60 MG 24 HR TABLET    Take 2 tablets (120 mg total) by mouth daily.   LIDOCAINE (LIDODERM) 5 %    Place 1 patch onto the skin daily as needed (For chestwall pain). Remove & Discard patch within 12 hours or as directed by MD   LOSARTAN (COZAAR) 50 MG TABLET    Take 50 mg by mouth daily.   MIRTAZAPINE (REMERON) 15 MG TABLET    Take by mouth.   NITROGLYCERIN (NITROSTAT) 0.4 MG SL TABLET    Place 1 tablet (0.4 mg total) under the tongue every 5 (five) minutes as needed for chest pain.   SYMBICORT 80-4.5 MCG/ACT INHALER    Inhale into the lungs.   XARELTO 15 MG TABS TABLET    Take 15 mg by mouth daily.  Modified Medications   No medications  on file  Discontinued Medications   ASPIRIN 81 MG EC TABLET    Take 1 tablet (81 mg total) by mouth daily. Swallow whole.   CLOPIDOGREL (PLAVIX) 75 MG TABLET    Take 1 tablet (75 mg total) by mouth daily.   MIRTAZAPINE (REMERON) 15 MG TABLET    Take 15 mg by mouth at bedtime.   ODEFSEY 200-25-25 MG TABS TABLET    Take 1 tablet by mouth daily.   SERTRALINE (ZOLOFT) 50 MG TABLET    Take 1 tablet (50 mg total) by mouth daily. For depression   TRAZODONE (DESYREL) 100 MG TABLET    Take 1 tablet (100 mg total) by mouth at bedtime as needed for sleep.      Past Medical History:  Diagnosis Date   A-fib ([place])    Asthma    No PFTs, history of childhood asthma   CAD (coronary artery disease)    a. 06/2013 STEMI/PCI ([facility]): LAD w/ thrombus (treated with BMS), mid 75%, D2 75%; LCX OM2 75%; RCA small, PDA 95%, PLV 95%;  b. 10/2013 Cath/PCI: ISR w/in LAD (Promus DES x 2), borderline OM2 lesion;  c. 01/2014 MV: Intermediate risk, medium-sized distal ant wall infarct w/ very small amt of peri-infarct ischemia. EF 60%.   Cellulitis 04/2014    left facial   Cellulitis and abscess of toe of right foot 12/08/2019   Chondromalacia of medial femoral condyle    Left knee MRI 04/28/12: Chondromalacia of the medial femoral condyle with slight peripheral degeneration of the meniscocapsular junction of the medial meniscus; followed by sports medicine   Collagen vascular disease ([place])    Crack cocaine use    for 20+ years, has been enrolled in detox programs in the past   Depression    with history of hospitalization for suicidal ideation   Diabetes mellitus 2002   Diagnosed in 2002, started insulin in 2012   Gout    Gout 04/28/2012   Headache(784.0)    CT head 08/2011: Periventricular and subcortical white matter hypodensities are most in keeping with chronic microangiopathic change   HIV infection [facility]) Nov 2012   Followed by [name]   Hyperlipidemia    Hypertension    Pulmonary embolism ([place])     Social History   Tobacco Use   Smoking status: Never   Smokeless tobacco: Never  Vaping Use   Vaping Use: Never used  Substance Use Topics   Alcohol use: No    Alcohol/week: 4.0 standard drinks    Types: 2 Cans of beer, 2 Shots of liquor per week   Drug use: Not Currently    Frequency: 4.0 times per week    Types: "Crack" cocaine, Cocaine    Comment: Recent use of crack    Family History  Problem Relation Age of Onset   Diabetes Mother    Hypertension Mother    Hyperlipidemia Mother    Diabetes Father    Cancer Father    Hypertension Father    Diabetes Brother    Heart disease Brother    Diabetes Sister    Colon cancer Neg Hx     No Known Allergies  Review of Systems  Constitutional: Negative.   Respiratory: Negative.    Cardiovascular: Negative.   Gastrointestinal: Negative.   Genitourinary: Negative.   Musculoskeletal:  Positive for joint pain.    OBJECTIVE:    Vitals:   04/25/21 1040  BP: (!) 162/81  Pulse: (!) 44  Temp: 97.7 F (36.5 C)  TempSrc: Oral  SpO2: 100%  Weight: 160 lb (72.6 kg)      Body mass index is 24.33 kg/m.   Physical Exam Constitutional:      General: He is not in acute distress.    Appearance: Normal appearance.  HENT:     Head: Normocephalic and atraumatic.  Pulmonary:     Effort: Pulmonary effort is normal. No respiratory distress.  Musculoskeletal:     Comments: S/p right 2nd toe amputation DFU of right great toe with callus.  Unable to determine depth.  Patient has tenderness to palpation.   Skin:    General: Skin is warm and dry.  Neurological:     General: No focal deficit present.     Mental Status: He is alert and oriented to person, place, and time.  Psychiatric:        Mood and Affect: Mood normal.        Behavior: Behavior normal.    Labs and Microbiology: CMP Latest Ref Rng & Units 04/04/2021 04/03/2021 12/12/2019  Glucose 70 - 99 mg/dL 179(H) 297(H) 356(H)  BUN 8 - 23 mg/dL 22 25(H) 48(H)  Creatinine 0.61 - 1.24 mg/dL 1.65(H) 1.76(H) 2.24(H)  Sodium 135 - 145 mmol/L 137 139 132(L)  Potassium 3.5 - 5.1 mmol/L 3.8 3.9 4.4  Chloride 98 - 111 mmol/L 110 103 98  CO2 22 - 32 mmol/L 23 23 21(L)  Calcium 8.9 - 10.3 mg/dL 8.6(L) 9.2 8.5(L)  Total Protein 6.5 - 8.1 g/dL - - 7.0  Total Bilirubin 0.3 - 1.2 mg/dL - - 0.6  Alkaline Phos 38 - 126 U/L - - 65  AST 15 - 41 U/L - - 32  ALT 0 - 44 U/L - - 13   CBC Latest Ref Rng & Units 04/04/2021 04/03/2021 12/12/2019  WBC 4.0 - 10.5 K/uL 3.8(L) 4.8 5.3  Hemoglobin 13.0 - 17.0 g/dL 7.2(L) 8.4(L) 10.1(L)  Hematocrit 39.0 - 52.0 % 23.6(L) 27.6(L) 31.6(L)  Platelets 150 - 400 K/uL 171 196 159     Lab Results  Component Value Date   HIV1RNAQUANT 1,220 04/03/2021   HIV1RNAQUANT 360 09/16/2018   HIV1RNAQUANT <20 10/14/2017   CD4TABS 413 04/03/2021   CD4TABS 270 (L) 09/16/2018   CD4TABS 630 10/14/2017    RPR and STI: Lab Results  Component Value Date   LABRPR NON-REACTIVE 10/14/2017   LABRPR NON REAC 01/08/2016   LABRPR NON REAC 03/20/2015   LABRPR NON REAC 04/07/2013   LABRPR NON REAC  12/10/2011    STI Results GC CT  02/20/2018 Negative Negative  03/20/2015 Negative Negative    Hepatitis B: Lab Results  Component Value Date   HEPBSAB NEG 01/08/2016   HEPBSAG NEGATIVE 10/18/2011   HEPBCAB NEG 10/18/2011   Hepatitis C: No results found for: HEPCAB, HCVRNAPCRQN Hepatitis A: Lab Results  Component Value Date   HAV NEG 10/18/2011   Lipids: Lab Results  Component Value Date   CHOL 196 12/09/2019   TRIG 134 12/09/2019   HDL 49 12/09/2019   CHOLHDL 4.0 12/09/2019   VLDL 27 12/09/2019   LDLCALC 120 (H) 12/09/2019       ASSESSMENT & PLAN:    HIV (human immunodeficiency virus infection) ([place]) Currently uncontrolled.  Patient met with pharmacy and financial today as well.  Will switch him from [facility] to [place] and arrange for mail order through [employer]  Will have him RTC 4 weeks and check intake HIV labs today.  He may be a good candidate for upcoming  paramedic program to help with his adherence issues.  Osteomyelitis ([name]) Patient with hx of OM of right 2nd toe s/p amputation and now with DFU ulcer and callus of right great toe.  Unclear the depth but concerning for OM in light of his poorly controlled A1c (9.6 in June).  Will order MRI, ESR, CRP, A1c.  Likely refer to Dr [name] who performed previous amputation if MRI shows OM.  ABI was normal and he has palpable pulses today but certainly at increased risk of PAD with HIV, HTN, DM, HLD.  Chronic kidney disease, stage III (moderate) ([name]) Creatinine recently stable at 1.65 with GFR ~44.  Will repeat labs today.  Biktarvy should be okay with his CKD.  He currently does not follow up with nephrology.   Orders Placed This Encounter  Procedures   MR TOES RIGHT W WO CONTRAST    Standing Status:   Future    Standing Expiration Date:   04/25/2022    Order Specific Question:   If indicated for the ordered procedure, I authorize the administration of contrast media per Radiology protocol    Answer:    Yes    Order Specific Question:   What is the patient's sedation requirement?    Answer:   No Sedation    Order Specific Question:   Does the patient have a pacemaker or implanted devices?    Answer:   No    Order Specific Question:   Preferred imaging location?    Answer:   [facility] (table limit - 500 lbs)   CBC WITH DIFFERENTIAL/PLATELET   COMPLETE METABOLIC PANEL WITH GFR   RPR   HEPATITIS B SURFACE ANTIGEN   HEPATITIS B SURFACE ANTIBODY   HEPATITIS B CORE ANTIBODY, TOTAL   HEPATITIS C ANTIBODY   HEPATITIS A ANTIBODY, TOTAL   URINALYSIS   LIPID PANEL   HIV antibody   HLA B*5701   QuantiFERON-TB Gold Plus   T-helper cell (CD4)- ([facility] clinic only)   HIV-1 RNA quant-no reflex-bld   HIV-1 Genotyping (RTI,PI,IN Inhbtr)   Sedimentation rate   C-reactive protein   HgB A1c    Raynelle [facility] for Infectious Disease [facility] 04/25/2021, 11:58 AM   I spent 40 minutes dedicated to the care of this patient on the date of this encounter to include pre-visit review of records, face-to-face time with the patient discussing HIV, OM, DM, CKD, and post-visit ordering of testing.

## 2021-04-25 NOTE — Assessment & Plan Note (Signed)
Currently uncontrolled.  Patient met with pharmacy and financial today as well.  Will switch him from Odefsey to Biktarvy and arrange for mail order through [facility].  Will have him RTC 4 weeks and check intake HIV labs today.  He may be a good candidate for upcoming paramedic program to help with his adherence issues. 

## 2021-04-25 NOTE — Assessment & Plan Note (Signed)
Patient with hx of OM of right 2nd toe s/p amputation and now with DFU ulcer and callus of right great toe.  Unclear the depth but concerning for OM in light of his poorly controlled A1c (9.6 in June).  Will order MRI, ESR, CRP, A1c.  Likely refer to Dr [name] who performed previous amputation if MRI shows OM.  ABI was normal and he has palpable pulses today but certainly at increased risk of PAD with HIV, HTN, DM, HLD.

## 2021-04-25 NOTE — Assessment & Plan Note (Signed)
Creatinine recently stable at 1.65 with GFR ~44.  Will repeat labs today.  Biktarvy should be okay with his CKD.  He currently does not follow up with nephrology.

## 2021-04-26 LAB — URINALYSIS
Bilirubin Urine: NEGATIVE
Ketones, ur: NEGATIVE
Leukocytes,Ua: NEGATIVE
Nitrite: NEGATIVE
Specific Gravity, Urine: 1.017 (ref 1.001–1.035)
pH: 6.5 (ref 5.0–8.0)

## 2021-04-26 LAB — URINE CYTOLOGY ANCILLARY ONLY
Chlamydia: NEGATIVE
Comment: NEGATIVE
Comment: NORMAL
Neisseria Gonorrhea: NEGATIVE

## 2021-04-27 ENCOUNTER — Ambulatory Visit (HOSPITAL_COMMUNITY)
Admission: RE | Admit: 2021-04-27 | Discharge: 2021-04-27 | Disposition: A | Discharge status: Home / Self Care | Payer: Medicare Other | Source: Ambulatory Visit | Attending: Internal Medicine | Admitting: Internal Medicine

## 2021-04-27 ENCOUNTER — Other Ambulatory Visit (HOSPITAL_COMMUNITY): Payer: Self-pay

## 2021-04-27 ENCOUNTER — Other Ambulatory Visit: Payer: Self-pay

## 2021-04-27 DIAGNOSIS — M86271 Subacute osteomyelitis, right ankle and foot: Secondary | ICD-10-CM | POA: Diagnosis not present

## 2021-04-27 LAB — T-HELPER CELL (CD4) - (RCID CLINIC ONLY)
CD4 % Helper T Cell: 36 % (ref 33–65)
CD4 T Cell Abs: 425 /uL (ref 400–1790)

## 2021-04-27 MED ORDER — GADOBUTROL 1 MMOL/ML IV SOLN
8.0000 mL | Freq: Once | INTRAVENOUS | Status: AC | PRN
  Administered 2021-04-27: 8 mL via INTRAVENOUS

## 2021-04-28 NOTE — Addendum Note (Signed)
Addended by: Mignon Pine on: 04/28/2021 09:31 PM   Modules accepted: Orders

## 2021-04-30 ENCOUNTER — Telehealth: Payer: Self-pay

## 2021-04-30 NOTE — Telephone Encounter (Signed)
Phone number listed is for [facility] rescue mission. Left HIPAA compliant voicemail requesting callback.   Dr. Juleen China would also like to relay to patient that "his MRI showed no evidence of bone infection and just soft tissue ulceration. No need for further antibiotics but I have referred him to wound care to further treat his DFU."   Beryle Flock, RN

## 2021-04-30 NOTE — Telephone Encounter (Signed)
-----   Message from Mignon Pine, DO sent at 04/28/2021  9:27 PM EDT ----- Please let patient know that labs are stable.  Follow up with me as planned in a few weeks.  He should be on Biktarvy now after switching from [place] at last visit.

## 2021-05-02 NOTE — Telephone Encounter (Signed)
Spoke with counselor at [facility], they state they do not see patient on the roster anymore. RN left message asking that patient please call his doctor's office to speak with a nurse if they hear from him and left callback number.   Beryle Flock, RN

## 2021-05-02 NOTE — Telephone Encounter (Signed)
Patient returning call, advised him per Dr. Juleen China that his labs are stable. Patient confirms he has been on [place].   Relayed per Dr. Juleen China that his MRI showed no evidence of bone infection, just soft tissue ulceration and no need for further antibiotics. He has not heard from wound care. Advised patient that RN would call wound care and have them leave a message with their phone number for him to call back as it looks like they have been unable to get in touch with him.   Reminded patient of follow up visit on 06/06/21.   Spoke with wound care and advised them that they will need to call [facility] rescue mission and leave a callback number with one of the counselors in order to contact patient.   Beryle Flock, RN

## 2021-05-04 ENCOUNTER — Other Ambulatory Visit: Payer: Self-pay

## 2021-05-04 ENCOUNTER — Ambulatory Visit (INDEPENDENT_AMBULATORY_CARE_PROVIDER_SITE_OTHER): Payer: Medicare Other | Admitting: Primary Care

## 2021-05-04 VITALS — BP 183/100 | HR 68 | Temp 97.3°F | Resp 20 | Wt 163.0 lb

## 2021-05-04 DIAGNOSIS — I10 Essential (primary) hypertension: Secondary | ICD-10-CM

## 2021-05-04 DIAGNOSIS — E119 Type 2 diabetes mellitus without complications: Secondary | ICD-10-CM

## 2021-05-04 NOTE — Progress Notes (Signed)
[facility]   Subjective:   Michael [name] is a 70 y.o. male presents for hospital follow up and establish care. Patient presented to the [facility] ED complaining of mid chest pain. No shortness of breath, nausea, diaphoresis.  Bp was excessively elevated. 225/99. He has a  history of noncompliance in his chart. UDS is positive .Admit date to the hospital was 04/03/21, patient was discharged from the hospital on 04/04/21, patient was admitted for:   Past Medical History:  Diagnosis Date   A-fib [facility])    Asthma    No PFTs, history of childhood asthma   CAD (coronary artery disease)    a. 06/2013 STEMI/PCI ([facility]): LAD w/ thrombus (treated with BMS), mid 75%, D2 75%; LCX OM2 75%; RCA small, PDA 95%, PLV 95%;  b. 10/2013 Cath/PCI: ISR w/in LAD (Promus DES x 2), borderline OM2 lesion;  c. 01/2014 MV: Intermediate risk, medium-sized distal ant wall infarct w/ very small amt of peri-infarct ischemia. EF 60%.   Cellulitis 04/2014   left facial   Cellulitis and abscess of toe of right foot 12/08/2019   Chondromalacia of medial femoral condyle    Left knee MRI 04/28/12: Chondromalacia of the medial femoral condyle with slight peripheral degeneration of the meniscocapsular junction of the medial meniscus; followed by sports medicine   Collagen vascular disease ([place])    Crack cocaine use    for 20+ years, has been enrolled in detox programs in the past   Depression    with history of hospitalization for suicidal ideation   Diabetes mellitus 2002   Diagnosed in 2002, started insulin in 2012   Gout    Gout 04/28/2012   Headache(784.0)    CT head 08/2011: Periventricular and subcortical white matter hypodensities are most in keeping with chronic microangiopathic change   HIV infection [facility]) Nov 2012   Followed by [name]   Hyperlipidemia    Hypertension    Pulmonary embolism ([place])      No Known Allergies    Current Outpatient Medications on File Prior to Visit   Medication Sig Dispense Refill   albuterol (VENTOLIN HFA) 108 (90 Base) MCG/ACT inhaler Inhale 1-2 puffs into the lungs every 6 (six) hours as needed for wheezing or shortness of breath. 8 g 1   allopurinol (ZYLOPRIM) 100 MG tablet Take by mouth.     amLODipine (NORVASC) 10 MG tablet Take 1 tablet (10 mg total) by mouth daily. 60 tablet 2   atorvastatin (LIPITOR) 40 MG tablet Take 1 tablet (40 mg total) by mouth daily. 60 tablet 2   bictegravir-emtricitabine-tenofovir AF (BIKTARVY) 50-200-25 MG TABS tablet Take 1 tablet by mouth daily. 30 tablet 11   buPROPion (WELLBUTRIN XL) 150 MG 24 hr tablet Take 150 mg by mouth every morning.     doxycycline (MONODOX) 100 MG capsule Take 100 mg by mouth 2 (two) times daily.     escitalopram (LEXAPRO) 10 MG tablet Take 10 mg by mouth daily.     famotidine (PEPCID) 20 MG tablet Take 20 mg by mouth daily.     insulin glargine (LANTUS) 100 UNIT/ML Solostar Pen Inject 30 Units into the skin daily. 15 mL 1   insulin lispro (HUMALOG KWIKPEN) 100 UNIT/ML KwikPen Inject 0.04 mLs (4 Units total) into the skin 3 (three) times daily with meals. 15 mL 0   Insulin Pen Needle 31G X 5 MM MISC 4 Units by Does not apply route in the morning, at noon, and at bedtime. 100  each 0   isosorbide mononitrate (IMDUR) 60 MG 24 hr tablet Take 2 tablets (120 mg total) by mouth daily. 60 tablet 2   lidocaine (LIDODERM) 5 % Place 1 patch onto the skin daily as needed (For chestwall pain). Remove & Discard patch within 12 hours or as directed by MD 30 patch 0   losartan (COZAAR) 50 MG tablet Take 50 mg by mouth daily.     mirtazapine (REMERON) 15 MG tablet Take by mouth.     nitroGLYCERIN (NITROSTAT) 0.4 MG SL tablet Place 1 tablet (0.4 mg total) under the tongue every 5 (five) minutes as needed for chest pain. 10 tablet 0   SYMBICORT 80-4.5 MCG/ACT inhaler Inhale into the lungs.     XARELTO 15 MG TABS tablet Take 15 mg by mouth daily.     No current facility-administered medications on  file prior to visit.     Review of System: ROS  Objective:  BP (!) 183/100   Pulse 68   Temp (!) 97.3 F (36.3 C)   Resp 20   Wt 163 lb (73.9 kg)   SpO2 100%   BMI 24.78 kg/m   Filed Weights   05/04/21 0935  Weight: 163 lb (73.9 kg)    Physical Exam: General Appearance: Well nourished, in no apparent distress. Eyes: PERRLA, EOMs, conjunctiva no swelling or erythema Sinuses: No Frontal/maxillary tenderness ENT/Mouth: Ext aud canals clear, TMs without erythema, bulging. No erythema, swelling, or exudate on post pharynx.  Tonsils not swollen or erythematous. Hearing normal.  Neck: Supple, thyroid normal.  Respiratory: Respiratory effort normal, BS equal bilaterally without rales, rhonchi, wheezing or stridor.  Cardio: RRR with no MRGs. Brisk peripheral pulses without edema.  Abdomen: Soft, + BS.  Non tender, no guarding, rebound, hernias, masses. Lymphatics: Non tender without lymphadenopathy.  Musculoskeletal: Full ROM, 5/5 strength, normal gait.  Skin: Warm, dry without rashes, lesions, ecchymosis.  Neuro: Cranial nerves intact. Normal muscle tone, no cerebellar symptoms. Sensation intact.  Psych: Awake and oriented X 3, normal affect, Insight and Judgment appropriate.    Assessment:  Dr.Burns Monday at 10 15 - May 07, 2021 No orders of the defined types were placed in this encounter.   This note has been created with Surveyor, quantity. Any transcriptional errors are unintentional.   Kerin Perna, NP 05/04/2021, 9:59 AM

## 2021-05-04 NOTE — Patient Instructions (Addendum)
Schedule appointment with PCP Dr. Quay Burow on May 07, 2021 at at 10:15 Hypertension, Adult Hypertension is another name for high blood pressure. High blood pressure forces your heart to work harder to pump blood. This can cause problems overtime. There are two numbers in a blood pressure reading. There is a top number (systolic) over a bottom number (diastolic). It is best to have a blood pressure that is below 120/80. Healthy choicescan help lower your blood pressure, or you may need medicine to help lower it. What are the causes? The cause of this condition is not known. Some conditions may be related tohigh blood pressure. What increases the risk? Smoking. Having type 2 diabetes mellitus, high cholesterol, or both. Not getting enough exercise or physical activity. Being overweight. Having too much fat, sugar, calories, or salt (sodium) in your diet. Drinking too much alcohol. Having long-term (chronic) kidney disease. Having a family history of high blood pressure. Age. Risk increases with age. Race. You may be at higher risk if you are African American. Gender. Men are at higher risk than women before age 83. After age 57, women are at higher risk than men. Having obstructive sleep apnea. Stress. What are the signs or symptoms? High blood pressure may not cause symptoms. Very high blood pressure (hypertensive crisis) may cause: Headache. Feelings of worry or nervousness (anxiety). Shortness of breath. Nosebleed. A feeling of being sick to your stomach (nausea). Throwing up (vomiting). Changes in how you see. Very bad chest pain. Seizures. How is this treated? This condition is treated by making healthy lifestyle changes, such as: Eating healthy foods. Exercising more. Drinking less alcohol. Your health care provider may prescribe medicine if lifestyle changes are not enough to get your blood pressure under control, and if: Your top number is above 130. Your bottom number is  above 80. Your personal target blood pressure may vary. Follow these instructions at home: Eating and drinking  If told, follow the DASH eating plan. To follow this plan: Fill one half of your plate at each meal with fruits and vegetables. Fill one fourth of your plate at each meal with whole grains. Whole grains include whole-wheat pasta, brown rice, and whole-grain bread. Eat or drink low-fat dairy products, such as skim milk or low-fat yogurt. Fill one fourth of your plate at each meal with low-fat (lean) proteins. Low-fat proteins include fish, chicken without skin, eggs, beans, and tofu. Avoid fatty meat, cured and processed meat, or chicken with skin. Avoid pre-made or processed food. Eat less than 1,500 mg of salt each day. Do not drink alcohol if: Your doctor tells you not to drink. You are pregnant, may be pregnant, or are planning to become pregnant. If you drink alcohol: Limit how much you use to: 0-1 drink a day for women. 0-2 drinks a day for men. Be aware of how much alcohol is in your drink. In the [place] one drink equals one 12 oz bottle of beer (355 mL), one 5 oz glass of wine (148 mL), or one 1 oz glass of hard liquor (44 mL).  Lifestyle  Work with your doctor to stay at a healthy weight or to lose weight. Ask your doctor what the best weight is for you. Get at least 30 minutes of exercise most days of the week. This may include walking, swimming, or biking. Get at least 30 minutes of exercise that strengthens your muscles (resistance exercise) at least 3 days a week. This may include lifting weights or doing Pilates.  Do not use any products that contain nicotine or tobacco, such as cigarettes, e-cigarettes, and chewing tobacco. If you need help quitting, ask your doctor. Check your blood pressure at home as told by your doctor. Keep all follow-up visits as told by your doctor. This is important.  Medicines Take over-the-counter and prescription medicines only as  told by your doctor. Follow directions carefully. Do not skip doses of blood pressure medicine. The medicine does not work as well if you skip doses. Skipping doses also puts you at risk for problems. Ask your doctor about side effects or reactions to medicines that you should watch for. Contact a doctor if you: Think you are having a reaction to the medicine you are taking. Have headaches that keep coming back (recurring). Feel dizzy. Have swelling in your ankles. Have trouble with your vision. Get help right away if you: Get a very bad headache. Start to feel mixed up (confused). Feel weak or numb. Feel faint. Have very bad pain in your: Chest. Belly (abdomen). Throw up more than once. Have trouble breathing. Summary Hypertension is another name for high blood pressure. High blood pressure forces your heart to work harder to pump blood. For most people, a normal blood pressure is less than 120/80. Making healthy choices can help lower blood pressure. If your blood pressure does not get lower with healthy choices, you may need to take medicine. This information is not intended to replace advice given to you by your health care provider. Make sure you discuss any questions you have with your healthcare provider. Document Revised: 06/03/2018 Document Reviewed: 06/03/2018 Elsevier Patient Education  [place].

## 2021-05-04 NOTE — Progress Notes (Signed)
Hospital follow up Pain right toe rates pain 9/10- osteomyelitis right foot  A1C- 8.5 on 04/25/2021  GAD-7   PHQ-9 - says he sees someone for depression and attempt to harm self

## 2021-05-07 LAB — CBC WITH DIFFERENTIAL/PLATELET
Absolute Monocytes: 432 cells/uL (ref 200–950)
Basophils Absolute: 19 cells/uL (ref 0–200)
Basophils Relative: 0.4 %
Eosinophils Absolute: 80 cells/uL (ref 15–500)
Eosinophils Relative: 1.7 %
HCT: 29.4 % — ABNORMAL LOW (ref 38.5–50.0)
Hemoglobin: 9.6 g/dL — ABNORMAL LOW (ref 13.2–17.1)
Lymphs Abs: 1175 cells/uL (ref 850–3900)
MCH: 27.3 pg (ref 27.0–33.0)
MCHC: 32.7 g/dL (ref 32.0–36.0)
MCV: 83.5 fL (ref 80.0–100.0)
MPV: 11.4 fL (ref 7.5–12.5)
Monocytes Relative: 9.2 %
Neutro Abs: 2994 cells/uL (ref 1500–7800)
Neutrophils Relative %: 63.7 %
Platelets: 177 10*3/uL (ref 140–400)
RBC: 3.52 10*6/uL — ABNORMAL LOW (ref 4.20–5.80)
RDW: 17.1 % — ABNORMAL HIGH (ref 11.0–15.0)
Total Lymphocyte: 25 %
WBC: 4.7 10*3/uL (ref 3.8–10.8)

## 2021-05-07 LAB — LIPID PANEL
Cholesterol: 149 mg/dL (ref <200)
HDL: 57 mg/dL (ref >=40)
LDL Cholesterol (Calc): 68 mg/dL (calc)
Non-HDL Cholesterol (Calc): 92 mg/dL (calc) (ref <130)
Total CHOL/HDL Ratio: 2.6 (calc) (ref <5.0)
Triglycerides: 166 mg/dL — ABNORMAL HIGH (ref <150)

## 2021-05-07 LAB — COMPLETE METABOLIC PANEL WITH GFR
AG Ratio: 1.1 (calc) (ref 1.0–2.5)
ALT: 19 U/L (ref 9–46)
AST: 21 U/L (ref 10–35)
Albumin: 3.6 g/dL (ref 3.6–5.1)
Alkaline phosphatase (APISO): 71 U/L (ref 35–144)
BUN/Creatinine Ratio: 21 (calc) (ref 6–22)
BUN: 41 mg/dL — ABNORMAL HIGH (ref 7–25)
CO2: 23 mmol/L (ref 20–32)
Calcium: 8.7 mg/dL (ref 8.6–10.3)
Chloride: 107 mmol/L (ref 98–110)
Creat: 1.97 mg/dL — ABNORMAL HIGH (ref 0.70–1.28)
Globulin: 3.2 g/dL (calc) (ref 1.9–3.7)
Glucose, Bld: 160 mg/dL — ABNORMAL HIGH (ref 65–99)
Potassium: 4.8 mmol/L (ref 3.5–5.3)
Sodium: 137 mmol/L (ref 135–146)
Total Bilirubin: 0.3 mg/dL (ref 0.2–1.2)
Total Protein: 6.8 g/dL (ref 6.1–8.1)
eGFR: 36 mL/min/{1.73_m2} — ABNORMAL LOW (ref >=60)

## 2021-05-07 LAB — HIV-1/2 AB - DIFFERENTIATION
HIV-1 antibody: POSITIVE — AB
HIV-2 Ab: NEGATIVE

## 2021-05-07 LAB — QUANTIFERON-TB GOLD PLUS
Mitogen-NIL: >10 IU/mL
NIL: 0.09 IU/mL
QuantiFERON-TB Gold Plus: NEGATIVE
TB1-NIL: <0 IU/mL
TB2-NIL: <0 IU/mL

## 2021-05-07 LAB — HEMOGLOBIN A1C
Hgb A1c MFr Bld: 8.5 % of total Hgb — ABNORMAL HIGH (ref <5.7)
Mean Plasma Glucose: 197 mg/dL
eAG (mmol/L): 10.9 mmol/L

## 2021-05-07 LAB — HEPATITIS A ANTIBODY, TOTAL: Hepatitis A AB,Total: NONREACTIVE

## 2021-05-07 LAB — HIV-1 GENOTYPING (RTI,PI,IN INHBTR): HIV-1 Genotype: NOT DETECTED

## 2021-05-07 LAB — RPR: RPR Ser Ql: NONREACTIVE

## 2021-05-07 LAB — HEPATITIS C ANTIBODY
Hepatitis C Ab: NONREACTIVE
SIGNAL TO CUT-OFF: 0.06 (ref <1.00)

## 2021-05-07 LAB — HEPATITIS B SURFACE ANTIBODY,QUALITATIVE: Hep B S Ab: NONREACTIVE

## 2021-05-07 LAB — HIV ANTIBODY (ROUTINE TESTING W REFLEX): HIV 1&2 Ab, 4th Generation: REACTIVE — AB

## 2021-05-07 LAB — HEPATITIS B CORE ANTIBODY, TOTAL: Hep B Core Total Ab: NONREACTIVE

## 2021-05-07 LAB — HLA B*5701: HLA-B*5701 w/rflx HLA-B High: NEGATIVE

## 2021-05-07 LAB — HEPATITIS B SURFACE ANTIGEN: Hepatitis B Surface Ag: NONREACTIVE

## 2021-05-07 LAB — HIV-1 RNA QUANT-NO REFLEX-BLD
HIV 1 RNA Quant: <20 Copies/mL — ABNORMAL HIGH
HIV-1 RNA Quant, Log: <1.3 Log cps/mL — ABNORMAL HIGH

## 2021-05-07 LAB — SEDIMENTATION RATE: Sed Rate: 43 mm/h — ABNORMAL HIGH (ref 0–20)

## 2021-05-07 LAB — C-REACTIVE PROTEIN: CRP: 1.3 mg/L (ref <8.0)

## 2021-05-16 ENCOUNTER — Other Ambulatory Visit (HOSPITAL_COMMUNITY): Payer: Self-pay

## 2021-05-22 ENCOUNTER — Other Ambulatory Visit (HOSPITAL_COMMUNITY): Payer: Self-pay

## 2021-06-05 NOTE — Progress Notes (Deleted)
[name]   CHIEF COMPLAINT    HIV follow up.    SUBJECTIVE:    Michael [name] is a 70 y.o. male with PMHx as below who presents to the clinic for HIV follow up.   Previously seen by me on 04/25/21 and presents today for follow up.  At that visit reported being off ART still, however, HIV RNA was <20 copies.  He was supposed to be on Harris and we switched him to Miller at that time.  His CD4 count is also  425.  We repeated his HIV intake labs since it had been several years as well.  HCV Ab negative, hepatitis B without immunity, hepatitis A without immunity.  He also had a diabetic foot ulcer that was being treated with doxycycline.  This was concerning for possible deep infection and we obtained ESR 43 and CRP 1.3.  MRI was obtained and this was negative for OM or deep infection.  He was seen in ED due to pain for this and sent to podiatry and had PCP follow up.  He was treated with Augmentin x 14 days for superficial infection.  ESR was stable at 40.  X-ray negative for OM but showed ulceration of distal tip of first toe.   Please see A&P for the details of today's visit and status of the patient's medical problems.   Patient's Medications  New Prescriptions   No medications on file  Previous Medications   ALBUTEROL (VENTOLIN HFA) 108 (90 BASE) MCG/ACT INHALER    Inhale 1-2 puffs into the lungs every 6 (six) hours as needed for wheezing or shortness of breath.   ALLOPURINOL (ZYLOPRIM) 100 MG TABLET    Take by mouth.   AMLODIPINE (NORVASC) 10 MG TABLET    Take 1 tablet (10 mg total) by mouth daily.   ATORVASTATIN (LIPITOR) 40 MG TABLET    Take 1 tablet (40 mg total) by mouth daily.   BICTEGRAVIR-EMTRICITABINE-TENOFOVIR AF (BIKTARVY) 50-200-25 MG TABS TABLET    Take 1 tablet by mouth daily.   BUPROPION (WELLBUTRIN XL) 150 MG 24 HR TABLET    Take 150 mg by mouth every morning.   DOXYCYCLINE (MONODOX) 100 MG CAPSULE    Take 100 mg by  mouth 2 (two) times daily.   ESCITALOPRAM (LEXAPRO) 10 MG TABLET    Take 10 mg by mouth daily.   FAMOTIDINE (PEPCID) 20 MG TABLET    Take 20 mg by mouth daily.   INSULIN GLARGINE (LANTUS) 100 UNIT/ML SOLOSTAR PEN    Inject 30 Units into the skin daily.   INSULIN LISPRO (HUMALOG KWIKPEN) 100 UNIT/ML KWIKPEN    Inject 0.04 mLs (4 Units total) into the skin 3 (three) times daily with meals.   INSULIN PEN NEEDLE 31G X 5 MM MISC    4 Units by Does not apply route in the morning, at noon, and at bedtime.   ISOSORBIDE MONONITRATE (IMDUR) 60 MG 24 HR TABLET    Take 2 tablets (120 mg total) by mouth daily.   LIDOCAINE (LIDODERM) 5 %    Place 1 patch onto the skin daily as needed (For chestwall pain). Remove & Discard patch within 12 hours or as directed by MD   LOSARTAN (COZAAR) 50 MG TABLET    Take 50 mg by mouth daily.   MIRTAZAPINE (REMERON) 15 MG TABLET    Take by mouth.   NITROGLYCERIN (NITROSTAT) 0.4 MG SL TABLET  Place 1 tablet (0.4 mg total) under the tongue every 5 (five) minutes as needed for chest pain.   SYMBICORT 80-4.5 MCG/ACT INHALER    Inhale into the lungs.   XARELTO 15 MG TABS TABLET    Take 15 mg by mouth daily.  Modified Medications   No medications on file  Discontinued Medications   No medications on file      Past Medical History:  Diagnosis Date   A-fib ([place])    Asthma    No PFTs, history of childhood asthma   CAD (coronary artery disease)    a. 06/2013 STEMI/PCI ([facility]): LAD w/ thrombus (treated with BMS), mid 75%, D2 75%; LCX OM2 75%; RCA small, PDA 95%, PLV 95%;  b. 10/2013 Cath/PCI: ISR w/in LAD (Promus DES x 2), borderline OM2 lesion;  c. 01/2014 MV: Intermediate risk, medium-sized distal ant wall infarct w/ very small amt of peri-infarct ischemia. EF 60%.   Cellulitis 04/2014   left facial   Cellulitis and abscess of toe of right foot 12/08/2019   Chondromalacia of medial femoral condyle    Left knee MRI 04/28/12: Chondromalacia of the medial femoral condyle with slight  peripheral degeneration of the meniscocapsular junction of the medial meniscus; followed by sports medicine   Collagen vascular disease ([place])    Crack cocaine use    for 20+ years, has been enrolled in detox programs in the past   Depression    with history of hospitalization for suicidal ideation   Diabetes mellitus 2002   Diagnosed in 2002, started insulin in 2012   Gout    Gout 04/28/2012   Headache(784.0)    CT head 08/2011: Periventricular and subcortical white matter hypodensities are most in keeping with chronic microangiopathic change   HIV infection [facility]) Nov 2012   Followed by [name]   Hyperlipidemia    Hypertension    Pulmonary embolism ([place])     Social History   Tobacco Use   Smoking status: Never   Smokeless tobacco: Never  Vaping Use   Vaping Use: Never used  Substance Use Topics   Alcohol use: No    Alcohol/week: 4.0 standard drinks    Types: 2 Cans of beer, 2 Shots of liquor per week   Drug use: Not Currently    Frequency: 4.0 times per week    Types: "Crack" cocaine, Cocaine    Comment: Recent use of crack    Family History  Problem Relation Age of Onset   Diabetes Mother    Hypertension Mother    Hyperlipidemia Mother    Diabetes Father    Cancer Father    Hypertension Father    Diabetes Brother    Heart disease Brother    Diabetes Sister    Colon cancer Neg Hx     No Known Allergies  ROS   OBJECTIVE:    There were no vitals filed for this visit.   There is no height or weight on file to calculate BMI.  Physical Exam  Labs and Microbiology: CMP Latest Ref Rng & Units 04/25/2021 04/04/2021 04/03/2021  Glucose 65 - 99 mg/dL 160(H) 179(H) 297(H)  BUN 7 - 25 mg/dL 41(H) 22 25(H)  Creatinine 0.70 - 1.28 mg/dL 1.97(H) 1.65(H) 1.76(H)  Sodium 135 - 146 mmol/L 137 137 139  Potassium 3.5 - 5.3 mmol/L 4.8 3.8 3.9  Chloride 98 - 110 mmol/L 107 110 103  CO2 20 - 32 mmol/L '23 23 23  ' Calcium 8.6 - 10.3 mg/dL 8.7 8.6(L)  9.2  Total Protein  6.1 - 8.1 g/dL 6.8 - -  Total Bilirubin 0.2 - 1.2 mg/dL 0.3 - -  Alkaline Phos 38 - 126 U/L - - -  AST 10 - 35 U/L 21 - -  ALT 9 - 46 U/L 19 - -   CBC Latest Ref Rng & Units 04/25/2021 04/04/2021 04/03/2021  WBC 3.8 - 10.8 Thousand/uL 4.7 3.8(L) 4.8  Hemoglobin 13.2 - 17.1 g/dL 9.6(L) 7.2(L) 8.4(L)  Hematocrit 38.5 - 50.0 % 29.4(L) 23.6(L) 27.6(L)  Platelets 140 - 400 Thousand/uL 177 171 196     Lab Results  Component Value Date   HIV1RNAQUANT <20 (H) 04/25/2021   HIV1RNAQUANT 1,220 04/03/2021   HIV1RNAQUANT 360 09/16/2018   CD4TABS 425 04/25/2021   CD4TABS 413 04/03/2021   CD4TABS 270 (L) 09/16/2018    RPR and STI: Lab Results  Component Value Date   LABRPR NON-REACTIVE 04/25/2021   LABRPR NON-REACTIVE 10/14/2017   LABRPR NON REAC 01/08/2016   LABRPR NON REAC 03/20/2015   LABRPR NON REAC 04/07/2013    STI Results GC CT  04/25/2021 Negative Negative  02/20/2018 Negative Negative  03/20/2015 Negative Negative    Hepatitis B: Lab Results  Component Value Date   HEPBSAB NON-REACTIVE 04/25/2021   HEPBSAG NON-REACTIVE 04/25/2021   HEPBCAB NON-REACTIVE 04/25/2021   Hepatitis C: Lab Results  Component Value Date   HEPCAB NON-REACTIVE 04/25/2021   Hepatitis A: Lab Results  Component Value Date   HAV NON-REACTIVE 04/25/2021   Lipids: Lab Results  Component Value Date   CHOL 149 04/25/2021   TRIG 166 (H) 04/25/2021   HDL 57 04/25/2021   CHOLHDL 2.6 04/25/2021   VLDL 27 12/09/2019   LDLCALC 68 04/25/2021    Imaging: ***   ASSESSMENT & PLAN:    No problem-specific Assessment & Plan notes found for this encounter.   No orders of the defined types were placed in this encounter.      *** Vaccines Influenza: give every year COVID: recommend vaccination if not already done Pneumovax-23: (if CD4 >200) give twice every 5 years apart before age 59, then once at age 49.  Give >8 weeks from [place] Prevnar-13: (preferably when CD4 >200) give once, give >1  year from last Pneumovax-23 Hepatitis A: give Havrix 2 dose series at 0 and 6-12 months if non-immune Hepatitis B: give Heplisav 2 dose series at 0 and 4 weeks if non-immune.  Repeat serology 2 months after vaccine and revaccinate if needed MenACWY: 2 dose primary series 8 weeks apart, then 1 dose booster every 5 years HPV: Gardasil-9 at 0, 2, and 6 months for ages 9-26 should be vaccinated.  Ages 19-45 should be offered if appropriate Tdap: give every 10 years Shingles: give Shingrix 2 dose series at 0 and 2-6 months if >50 years on ART with CD4 cell count >200 Varicella: primary vaccination may be considered in VZV seronegative persons aged >8 years (if CD4 >200)  MMR: vaccine should be given if born in [birth year] or after and do not have immunity (if CD4 >200)  Screening DEXA Scan: if age >43 Quantiferon: check at initiation of care Hepatitis C: check at initiation of care.  Screen annually if risk factors HLA [id number]: check at initiation of care G6PD: check if starting therapy with oxidant drugs Lipids: check annually Urinalysis: check annually or every 6 months if on tenofovir Hgb A1c: check annually  ASCVD Risk Score Consider high-intensity statin therapy if 10-year ASCVD risk score >7.5% The ASCVD Risk score (  Renaye Rakers., et al., 2013) failed to calculate for the following reasons:   The patient has a prior MI or stroke diagnosis   [facility] Infectious [facility] 06/05/2021, 3:51 PM   Hep B PPSV 32  HIV RNA CD4 CMP  HIV: ***  Need for PCP PPx: ***  Vaccines: ***  STI/Screening: ***  Encounter for medication monitoring: ***

## 2021-06-06 ENCOUNTER — Ambulatory Visit: Payer: Medicare Other | Admitting: Internal Medicine

## 2021-06-20 DIAGNOSIS — L089 Local infection of the skin and subcutaneous tissue, unspecified: Secondary | ICD-10-CM | POA: Insufficient documentation

## 2021-06-22 ENCOUNTER — Other Ambulatory Visit (HOSPITAL_COMMUNITY): Payer: Self-pay

## 2021-06-26 ENCOUNTER — Other Ambulatory Visit (HOSPITAL_COMMUNITY): Payer: Self-pay

## 2021-06-28 ENCOUNTER — Telehealth: Payer: Self-pay

## 2021-06-28 ENCOUNTER — Other Ambulatory Visit (HOSPITAL_COMMUNITY): Payer: Self-pay

## 2021-06-28 NOTE — Telephone Encounter (Signed)
[facility] Patient Advocate Encounter  Patient medication was sent back to the [facility] because patient is not living at the facility. I spoke to the patient today he is in [facility] getting his toe amputated and then will be living in another facility. He told me [facility] O[facility] was giving him his medications, and he will call me back at the clinic to let me know when he have an address to send his medication to him.  Ileene Patrick, [facility] Patient [facility] for Infectious Disease Phone: [phone] Fax:  [phone]

## 2021-08-14 ENCOUNTER — Other Ambulatory Visit: Payer: Self-pay

## 2021-08-14 ENCOUNTER — Emergency Department (HOSPITAL_COMMUNITY): Payer: Medicare Other

## 2021-08-14 ENCOUNTER — Encounter (HOSPITAL_COMMUNITY): Payer: Self-pay | Admitting: Emergency Medicine

## 2021-08-14 ENCOUNTER — Emergency Department (HOSPITAL_COMMUNITY)
Admission: EM | Admit: 2021-08-14 | Discharge: 2021-08-17 | Disposition: A | Discharge status: Home / Self Care | Payer: Medicare Other | Attending: Emergency Medicine | Admitting: Emergency Medicine

## 2021-08-14 DIAGNOSIS — R0602 Shortness of breath: Secondary | ICD-10-CM | POA: Diagnosis not present

## 2021-08-14 DIAGNOSIS — Z794 Long term (current) use of insulin: Secondary | ICD-10-CM | POA: Insufficient documentation

## 2021-08-14 DIAGNOSIS — Z20822 Contact with and (suspected) exposure to covid-19: Secondary | ICD-10-CM | POA: Diagnosis not present

## 2021-08-14 DIAGNOSIS — Z8616 Personal history of COVID-19: Secondary | ICD-10-CM | POA: Insufficient documentation

## 2021-08-14 DIAGNOSIS — Z951 Presence of aortocoronary bypass graft: Secondary | ICD-10-CM | POA: Diagnosis not present

## 2021-08-14 DIAGNOSIS — Z79899 Other long term (current) drug therapy: Secondary | ICD-10-CM | POA: Diagnosis not present

## 2021-08-14 DIAGNOSIS — F332 Major depressive disorder, recurrent severe without psychotic features: Secondary | ICD-10-CM | POA: Diagnosis not present

## 2021-08-14 DIAGNOSIS — R45851 Suicidal ideations: Secondary | ICD-10-CM | POA: Diagnosis not present

## 2021-08-14 DIAGNOSIS — F1410 Cocaine abuse, uncomplicated: Secondary | ICD-10-CM | POA: Diagnosis not present

## 2021-08-14 DIAGNOSIS — G8929 Other chronic pain: Secondary | ICD-10-CM | POA: Insufficient documentation

## 2021-08-14 DIAGNOSIS — N183 Chronic kidney disease, stage 3 unspecified: Secondary | ICD-10-CM | POA: Insufficient documentation

## 2021-08-14 DIAGNOSIS — E1140 Type 2 diabetes mellitus with diabetic neuropathy, unspecified: Secondary | ICD-10-CM | POA: Insufficient documentation

## 2021-08-14 DIAGNOSIS — I130 Hypertensive heart and chronic kidney disease with heart failure and stage 1 through stage 4 chronic kidney disease, or unspecified chronic kidney disease: Secondary | ICD-10-CM | POA: Insufficient documentation

## 2021-08-14 DIAGNOSIS — J45909 Unspecified asthma, uncomplicated: Secondary | ICD-10-CM | POA: Insufficient documentation

## 2021-08-14 DIAGNOSIS — I509 Heart failure, unspecified: Secondary | ICD-10-CM | POA: Diagnosis not present

## 2021-08-14 DIAGNOSIS — R079 Chest pain, unspecified: Secondary | ICD-10-CM | POA: Diagnosis present

## 2021-08-14 DIAGNOSIS — I2510 Atherosclerotic heart disease of native coronary artery without angina pectoris: Secondary | ICD-10-CM | POA: Diagnosis not present

## 2021-08-14 DIAGNOSIS — E1122 Type 2 diabetes mellitus with diabetic chronic kidney disease: Secondary | ICD-10-CM | POA: Diagnosis not present

## 2021-08-14 DIAGNOSIS — N189 Chronic kidney disease, unspecified: Secondary | ICD-10-CM

## 2021-08-14 DIAGNOSIS — Z7901 Long term (current) use of anticoagulants: Secondary | ICD-10-CM | POA: Insufficient documentation

## 2021-08-14 LAB — CBC WITH DIFFERENTIAL/PLATELET
Abs Immature Granulocytes: 0.02 10*3/uL (ref 0.00–0.07)
Basophils Absolute: 0 10*3/uL (ref 0.0–0.1)
Basophils Relative: 1 %
Eosinophils Absolute: 0.1 10*3/uL (ref 0.0–0.5)
Eosinophils Relative: 2 %
HCT: 27.8 % — ABNORMAL LOW (ref 39.0–52.0)
Hemoglobin: 8.8 g/dL — ABNORMAL LOW (ref 13.0–17.0)
Immature Granulocytes: 0 %
Lymphocytes Relative: 17 %
Lymphs Abs: 0.9 10*3/uL (ref 0.7–4.0)
MCH: 28 pg (ref 26.0–34.0)
MCHC: 31.7 g/dL (ref 30.0–36.0)
MCV: 88.5 fL (ref 80.0–100.0)
Monocytes Absolute: 0.4 10*3/uL (ref 0.1–1.0)
Monocytes Relative: 7 %
Neutro Abs: 4 10*3/uL (ref 1.7–7.7)
Neutrophils Relative %: 73 %
Platelets: 238 10*3/uL (ref 150–400)
RBC: 3.14 MIL/uL — ABNORMAL LOW (ref 4.22–5.81)
RDW: 14.4 % (ref 11.5–15.5)
WBC: 5.4 10*3/uL (ref 4.0–10.5)
nRBC: 0 % (ref 0.0–0.2)

## 2021-08-14 LAB — BASIC METABOLIC PANEL
Anion gap: 11 (ref 5–15)
BUN: 35 mg/dL — ABNORMAL HIGH (ref 8–23)
CO2: 25 mmol/L (ref 22–32)
Calcium: 8.3 mg/dL — ABNORMAL LOW (ref 8.9–10.3)
Chloride: 100 mmol/L (ref 98–111)
Creatinine, Ser: 2.46 mg/dL — ABNORMAL HIGH (ref 0.61–1.24)
GFR, Estimated: 27 mL/min — ABNORMAL LOW (ref >60)
Glucose, Bld: 199 mg/dL — ABNORMAL HIGH (ref 70–99)
Potassium: 4.8 mmol/L (ref 3.5–5.1)
Sodium: 136 mmol/L (ref 135–145)

## 2021-08-14 LAB — RESP PANEL BY RT-PCR (FLU A&B, COVID) ARPGX2
Influenza A by PCR: NEGATIVE
Influenza B by PCR: NEGATIVE
SARS Coronavirus 2 by RT PCR: NEGATIVE

## 2021-08-14 LAB — CBG MONITORING, ED: Glucose-Capillary: 199 mg/dL — ABNORMAL HIGH (ref 70–99)

## 2021-08-14 LAB — TROPONIN I (HIGH SENSITIVITY)
Troponin I (High Sensitivity): 85 ng/L — ABNORMAL HIGH (ref <18)
Troponin I (High Sensitivity): 102 ng/L (ref <18)

## 2021-08-14 NOTE — ED Triage Notes (Signed)
Patient c/o shortness of breath and toe pain. Sates his right big toe was amputated a few months ago and has been bleeding and hurting. Patient adds he is suicidal.

## 2021-08-14 NOTE — ED Provider Notes (Signed)
[facility] EMERGENCY DEPARTMENT Provider Note   CSN: [id number] Arrival date & time: 08/14/21  1827     History Chief Complaint  Patient presents with   Shortness of Breath   Toe Pain   Suicidal    Michael [name] is a 70 y.o. male.   Shortness of Breath Toe Pain Associated symptoms include shortness of breath.    Patient presents to the ED with several complaints.  Patient states he is having issues with coughing and sharp pain in his chest.  Pain is mostly on the left side and increases when he coughs.  The symptoms have been ongoing for several days.  He was also noticed intermittent chest pain and shortness of breath for several months.  Patient also states he has had some discomfort on his right big toe where he had an amputation.  He has noticed some intermittent bleeding.  He has not had any increasing drainage fevers or chills.  Patient also states that he has been depressed and has been thinking about walking out into traffic.  Patient states he just recently moved to [place].  Past Medical History:  Diagnosis Date   A-fib ([place])    Asthma    No PFTs, history of childhood asthma   CAD (coronary artery disease)    a. 06/2013 STEMI/PCI ([facility]): LAD w/ thrombus (treated with BMS), mid 75%, D2 75%; LCX OM2 75%; RCA small, PDA 95%, PLV 95%;  b. 10/2013 Cath/PCI: ISR w/in LAD (Promus DES x 2), borderline OM2 lesion;  c. 01/2014 MV: Intermediate risk, medium-sized distal ant wall infarct w/ very small amt of peri-infarct ischemia. EF 60%.   Cellulitis 04/2014   left facial   Cellulitis and abscess of toe of right foot 12/08/2019   Chondromalacia of medial femoral condyle    Left knee MRI 04/28/12: Chondromalacia of the medial femoral condyle with slight peripheral degeneration of the meniscocapsular junction of the medial meniscus; followed by sports medicine   Collagen vascular disease ([place])    Crack cocaine use    for 20+ years, has been enrolled in detox  programs in the past   Depression    with history of hospitalization for suicidal ideation   Diabetes mellitus 2002   Diagnosed in 2002, started insulin in 2012   Gout    Gout 04/28/2012   Headache(784.0)    CT head 08/2011: Periventricular and subcortical white matter hypodensities are most in keeping with chronic microangiopathic change   HIV infection [facility]) Nov 2012   Followed by [name]   Hyperlipidemia    Hypertension    Pulmonary embolism [facility])     Patient Active Problem List   Diagnosis Date Noted   Iron deficiency anemia 04/04/2021   Atypical chest pain 04/03/2021   Homelessness 12/13/2019   Cellulitis 12/07/2019   Osteomyelitis ([place]) 07/17/2019   Abscess or cellulitis of toe, right    Toe pain, right-second    MDD (major depressive disorder), recurrent episode, severe ([place]) 06/15/2019   Respiratory distress 04/12/2019   Pneumonia due to severe acute respiratory syndrome coronavirus 2 (SARS-CoV-2) 04/11/2019   COVID-19 virus infection 03/19/2019   Chest pain 10/03/2018   Malnutrition of moderate degree 09/21/2018   MDD (major depressive disorder), recurrent episode, moderate ([place])    Type II diabetes mellitus with renal manifestations ([place]) 05/04/2018   GERD (gastroesophageal reflux disease) 05/04/2018   S/P CABG (coronary artery bypass graft)    Left testicular pain    Depression 02/20/2018  Epididymo-orchitis, acute 02/20/2018   Essential hypertension 02/20/2018   Precordial chest pain 02/20/2018   AKI (acute kidney injury) ([place]) 02/14/2018   HCAP (healthcare-associated pneumonia) 02/14/2018   History of pulmonary embolism 07/04/2017   Acute hyponatremia 05/11/2017   Hyperglycemia due to type 2 diabetes mellitus ([place]) 05/11/2017   CHF (congestive heart failure) ([place]) 04/01/2017   Hepatitis C 12/30/2016   Chronic ischemic heart disease 11/12/2016   Paroxysmal A-fib ([place]) 11/12/2016   Back pain 04/18/2016   S/P carotid endarterectomy 11/15/2015   MDD  (major depressive disorder), recurrent severe, without psychosis ([place]) 09/09/2015   Cocaine-induced mood disorder ([place]) 08/14/2015   Cocaine abuse with cocaine-induced mood disorder ([place]) 08/14/2015   Gout 07/10/2015   Acute renal failure superimposed on stage 3 chronic kidney disease ([place]) 03/06/2015   Anemia 03/06/2015   Chronic kidney disease, stage III (moderate) ([place]) 03/06/2015   Hypoglycemia    Encounter for general adult medical examination with abnormal findings 02/09/2015   Cocaine use disorder, severe, dependence ([place]) 12/13/2014   Substance or medication-induced depressive disorder with onset during withdrawal ([place]) 12/13/2014   Severe recurrent major depressive disorder with psychotic features ([place]) 12/12/2014   Cervicalgia 06/28/2014   Lumbar radiculopathy, chronic 06/28/2014   Asthma, chronic 02/03/2014   3-vessel CAD 06/24/2013   ED (erectile dysfunction) of organic origin 07/07/2012   Hypertension goal BP (blood pressure) < 140/80 04/29/2012   Chondromalacia of left knee 03/19/2012   Hyperlipidemia with target LDL less than 100 02/12/2012   Fibromyalgia 02/12/2012   HIV (human immunodeficiency virus infection) ([place]) 08/27/2011   Uncontrolled type 2 diabetes with neuropathy 10/17/2000    Past Surgical History:  Procedure Laterality Date   AMPUTATION Right 07/21/2019   Procedure: RIGHT SECOND TOE AMPUTATION;  Surgeon: [name], MD;  Location: [place];  Service: Orthopedics;  Laterality: Right;   BACK SURGERY     1988   BOWEL RESECTION     CARDIAC SURGERY     CERVICAL SPINE SURGERY     " rods in my neck "   CORONARY ARTERY BYPASS GRAFT     CORONARY STENT PLACEMENT     NM MYOCAR PERF WALL MOTION  12/27/2011   normal   SPINE SURGERY         Family History  Problem Relation Age of Onset   Diabetes Mother    Hypertension Mother    Hyperlipidemia Mother    Diabetes Father    Cancer Father    Hypertension Father    Diabetes Brother    Heart disease  Brother    Diabetes Sister    Colon cancer Neg Hx     Social History   Tobacco Use   Smoking status: Never   Smokeless tobacco: Never  Vaping Use   Vaping Use: Never used  Substance Use Topics   Alcohol use: No    Alcohol/week: 4.0 standard drinks    Types: 2 Cans of beer, 2 Shots of liquor per week   Drug use: Not Currently    Frequency: 4.0 times per week    Types: "Crack" cocaine, Cocaine    Comment: Recent use of crack    Home Medications Prior to Admission medications   Medication Sig Start Date End Date Taking? Authorizing Provider  albuterol (VENTOLIN HFA) 108 (90 Base) MCG/ACT inhaler Inhale 1-2 puffs into the lungs every 6 (six) hours as needed for wheezing or shortness of breath. 12/09/19   Eugenie Filler, MD  allopurinol (ZYLOPRIM) 100 MG tablet  Take by mouth. 04/18/21   [provider]  amLODipine (NORVASC) 10 MG tablet Take 1 tablet (10 mg total) by mouth daily. 04/05/21   Lacinda Axon, MD  atorvastatin (LIPITOR) 40 MG tablet Take 1 tablet (40 mg total) by mouth daily. 04/05/21 10/02/21  Lacinda Axon, MD  bictegravir-emtricitabine-tenofovir AF (BIKTARVY) 50-200-25 MG TABS tablet Take 1 tablet by mouth daily. 04/25/21   Mignon Pine, DO  buPROPion (WELLBUTRIN XL) 150 MG 24 hr tablet Take 150 mg by mouth every morning. 04/22/21   [provider]  doxycycline (MONODOX) 100 MG capsule Take 100 mg by mouth 2 (two) times daily. 04/22/21   [provider]  escitalopram (LEXAPRO) 10 MG tablet Take 10 mg by mouth daily. 04/22/21   [provider]  famotidine (PEPCID) 20 MG tablet Take 20 mg by mouth daily. 02/23/21   [provider]  insulin glargine (LANTUS) 100 UNIT/ML Solostar Pen Inject 30 Units into the skin daily. 12/09/19   Eugenie Filler, MD  insulin lispro (HUMALOG KWIKPEN) 100 UNIT/ML KwikPen Inject 0.04 mLs (4 Units total) into the skin 3 (three) times daily with meals. 12/09/19   Eugenie Filler, MD   Insulin Pen Needle 31G X 5 MM MISC 4 Units by Does not apply route in the morning, at noon, and at bedtime. 12/09/19   Eugenie Filler, MD  isosorbide mononitrate (IMDUR) 60 MG 24 hr tablet Take 2 tablets (120 mg total) by mouth daily. 04/04/21 10/01/21  Lacinda Axon, MD  lidocaine (LIDODERM) 5 % Place 1 patch onto the skin daily as needed (For chestwall pain). Remove & Discard patch within 12 hours or as directed by MD 04/04/21   Lacinda Axon, MD  losartan (COZAAR) 50 MG tablet Take 50 mg by mouth daily. 02/16/21   [provider]  mirtazapine (REMERON) 15 MG tablet Take by mouth. 04/18/21   [provider]  nitroGLYCERIN (NITROSTAT) 0.4 MG SL tablet Place 1 tablet (0.4 mg total) under the tongue every 5 (five) minutes as needed for chest pain. 12/09/19   Eugenie Filler, MD  SYMBICORT 80-4.5 MCG/ACT inhaler Inhale into the lungs. 04/22/21   [provider]  XARELTO 15 MG TABS tablet Take 15 mg by mouth daily. 04/22/21   [provider]    Allergies    Patient has no known allergies.  Review of Systems   Review of Systems  Respiratory:  Positive for shortness of breath.   All other systems reviewed and are negative.  Physical Exam Updated Vital Signs BP (!) 118/51   Pulse (!) 117   Temp 98 F (36.7 C) (Oral)   Resp 20   Ht 1.727 m (5\' 8" )   Wt 70.3 kg   SpO2 90%   BMI 23.57 kg/m   Physical Exam Vitals and nursing note reviewed.  Constitutional:      General: He is not in acute distress.    Appearance: He is not ill-appearing.     Comments: Patient is in the ED with his suitcase  HENT:     Head: Normocephalic and atraumatic.     Right Ear: External ear normal.     Left Ear: External ear normal.  Eyes:     General: No scleral icterus.       Right eye: No discharge.        Left eye: No discharge.     Conjunctiva/sclera: Conjunctivae normal.  Neck:     Trachea: No tracheal  deviation.  Cardiovascular:     Rate and Rhythm:  Normal rate and regular rhythm.  Pulmonary:     Effort: Pulmonary effort is normal. No respiratory distress.     Breath sounds: Normal breath sounds. No stridor. No wheezing or rales.     Comments: Frequent coughing Abdominal:     General: Bowel sounds are normal. There is no distension.     Palpations: Abdomen is soft.     Tenderness: There is no abdominal tenderness. There is no guarding or rebound.  Musculoskeletal:        General: No tenderness or deformity.     Cervical back: Neck supple.     Right lower leg: No edema.     Left lower leg: No edema.     Comments: Status post partial amputation of right big toe, no surrounding erythema, no fluctuance superficial scab noted at the tip of the toe  Skin:    General: Skin is warm and dry.     Findings: No rash.  Neurological:     General: No focal deficit present.     Mental Status: He is alert.     Cranial Nerves: No cranial nerve deficit (no facial droop, extraocular movements intact, no slurred speech).     Sensory: No sensory deficit.     Motor: No abnormal muscle tone or seizure activity.     Coordination: Coordination normal.  Psychiatric:        Mood and Affect: Mood is not anxious or elated.        Speech: Speech is not delayed or tangential.        Behavior: Behavior is not aggressive, withdrawn or hyperactive.    ED Results / Procedures / Treatments   Labs (all labs ordered are listed, but only abnormal results are displayed) Labs Reviewed  BASIC METABOLIC PANEL - Abnormal; Notable for the following components:      Result Value   Glucose, Bld 199 (*)    BUN 35 (*)    Creatinine, Ser 2.46 (*)    Calcium 8.3 (*)    GFR, Estimated 27 (*)    All other components within normal limits  CBC WITH DIFFERENTIAL/PLATELET - Abnormal; Notable for the following components:   RBC 3.14 (*)    Hemoglobin 8.8 (*)    HCT 27.8 (*)    All other components within normal limits  CBG MONITORING, ED - Abnormal; Notable for the  following components:   Glucose-Capillary 199 (*)    All other components within normal limits  TROPONIN I (HIGH SENSITIVITY) - Abnormal; Notable for the following components:   Troponin I (High Sensitivity) 85 (*)    All other components within normal limits  TROPONIN I (HIGH SENSITIVITY) - Abnormal; Notable for the following components:   Troponin I (High Sensitivity) 102 (*)    All other components within normal limits  RESP PANEL BY RT-PCR (FLU A&B, COVID) ARPGX2  RAPID URINE DRUG SCREEN, HOSP PERFORMED  ETHANOL    EKG EKG Interpretation  Date/Time:  Tuesday August 14 2021 19:01:58 EST Ventricular Rate:  86 PR Interval:  136 QRS Duration: 68 QT Interval:  350 QTC Calculation: 418 R Axis:   49 Text Interpretation: Sinus rhythm with marked sinus arrhythmia ST & T wave abnormality, consider lateral ischemia Abnormal ECG No significant change since last tracing Confirmed by [name] [phone]) on 08/14/2021 10:49:10 PM  Radiology DG Chest 2 View  Result Date: 08/14/2021 CLINICAL DATA:  Chest pain, shortness of breath  EXAM: CHEST - 2 VIEW COMPARISON:  Chest x-ray 04/07/2021 FINDINGS: The heart and mediastinal contours are within normal limits. Atrial appendage clip noted. Coronary artery stent. Aortic calcification. Elevated left hemidiaphragm. No focal consolidation. No pulmonary edema. No pleural effusion. No pneumothorax. No acute osseous abnormality. Partially visualized cervicothoracic surgical hardware. IMPRESSION: 1. No active cardiopulmonary disease. 2.  Aortic Atherosclerosis (ICD10-I70.0). Electronically Signed   By: Iven Finn M.D.   On: 08/14/2021 21:01   DG Foot Complete Right  Result Date: 08/14/2021 CLINICAL DATA:  Toe pain EXAM: RIGHT FOOT COMPLETE - 3+ VIEW COMPARISON:  12/07/2019 radiographs, MRI 04/27/2021 FINDINGS: Status post amputation of first digit at the level of the IP joint. Generalized soft tissue swelling. No osseous destructive change. Previous  second digit amputation at the level of the MTP joint. Old fracture deformities of the fourth and fifth metatarsals. IMPRESSION: 1. Status post amputation of first digit at the level of IP joint. No definite acute osseous abnormality. 2. Previous amputation of the second digit at the level of the MTP joint, also without acute osseous abnormality Electronically Signed   By: Donavan Foil M.D.   On: 08/14/2021 20:58    Procedures Procedures   Medications Ordered in ED Medications - No data to display  ED Course  I have reviewed the triage vital signs and the nursing notes.  Pertinent labs & imaging results that were available during my care of the patient were reviewed by me and considered in my medical decision making (see chart for details).  Clinical Course as of 08/14/21 2303  Tue Aug 14, 2021  2023 CBC with Differential(!) Anemia noted.  Similar to previous values [JK]  2024 Previous records reviewed.  Patient was at [facility] emergency department yesterday with complaints of chest pain after smoking crack [JK]  2247 Troponin elevated but similar to previous values. [JK]  2248 Creatinine chronically elevated [JK]  2248 Chest x-ray without acute findings [JK]  2248 Foot x-rays without acute abnormality [JK]  2250 Labs reviewed from visit at [facility] yesterday.  Patient's creatinine was elevated 2.38.  Patient's troponin T's were also elevated in the 300s [JK]    Clinical Course User Index [JK] [name], MD   MDM Rules/Calculators/A&P                           Patient presented to the ED with several complaints including chest pain, toe pain and suicidal ideation.  I was able to review patient's prior records from other medical facilities including [place] and [place].  He does have history of cocaine use and recurrent chest pain.  Patient does not have any acute EKG changes today.  He does have elevated troponins however these appear to be similar to previous values.  I doubt  acute coronary syndrome.  Patient does have elevated creatinine but this is not significantly changed from previous values.  There is no signs of infection in the patient's toe, no signs of osteomyelitis on x-ray.  I do not think there is any acute medical abnormalities requiring hospitalization at this time.  Patient however is also complaining of worsening depression and suicidal ideation.  I will consult with psychiatry.  The patient has been placed in psychiatric observation due to the need to provide a safe environment for the patient while obtaining psychiatric consultation and evaluation, as well as ongoing medical and medication management to treat the patient's condition.  The patient has not been placed under  full IVC at this time.  Final Clinical Impression(s) / ED Diagnoses Final diagnoses:  Suicidal ideation  Chronic kidney disease, unspecified CKD stage  Chronic chest pain     [name], MD 08/14/21 2307

## 2021-08-14 NOTE — ED Provider Notes (Signed)
Emergency Medicine Provider Triage Evaluation Note  Michael [name] , a 70 y.o. male  was evaluated in triage.  Pt complains of multiple medical complaints.  He states that he has had chest pain and shortness of breath over the past few months.  Also endorses that he had his right big toe amputated "a few months ago" and that he wants that checked on because it has been intermittently bleeding.  Endorses cough without sore throat or fever.  He also states that he is suicidal.  He states that he has attempted cutting his wrist 6 months ago and has thoughts about walking out into traffic.  He also complains of being fatigued.   Review of Systems  Positive: Chest pain, shortness of breath, suicidal ideation Negative: Fever  Physical Exam  BP (!) 144/80 (BP Location: Right Arm)   Pulse 85   Temp 98 F (36.7 C) (Oral)   Resp 18   Ht 5\' 8"  (1.727 m)   Wt 70.3 kg   SpO2 100%   BMI 23.57 kg/m  Gen:   Awake, no distress   Resp:  Normal effort, lungs are clear to auscultation bilateral MSK:   Moves extremities without difficulty  Other:  S1-S2 without murmurs.  Abdomen is soft.  Medical Decision Making  Medically screening exam initiated at 7:10 PM.  Appropriate orders placed.  Michael [name] was informed that the remainder of the evaluation will be completed by another provider, this initial triage assessment does not replace that evaluation, and the importance of remaining in the ED until their evaluation is complete.     Mickie Hillier, PA-C 08/14/21 1913    Godfrey Pick, MD 08/15/21 [phone]

## 2021-08-15 LAB — RAPID URINE DRUG SCREEN, HOSP PERFORMED
Amphetamines: NOT DETECTED
Barbiturates: NOT DETECTED
Benzodiazepines: NOT DETECTED
Cocaine: POSITIVE — AB
Opiates: NOT DETECTED
Tetrahydrocannabinol: NOT DETECTED

## 2021-08-15 LAB — CBG MONITORING, ED: Glucose-Capillary: 138 mg/dL — ABNORMAL HIGH (ref 70–99)

## 2021-08-15 MED ORDER — TRAZODONE HCL 50 MG PO TABS: 50.0000 mg | ORAL_TABLET | Freq: Every evening | ORAL | Status: DC | PRN

## 2021-08-15 MED ORDER — INSULIN GLARGINE-YFGN 100 UNIT/ML ~~LOC~~ SOLN
30.0000 [IU] | Freq: Every day | SUBCUTANEOUS | Status: DC
  Administered 2021-08-15 – 2021-08-17 (×3): 30 [IU] via SUBCUTANEOUS
  Filled 2021-08-15 (×3): qty 0.3

## 2021-08-15 MED ORDER — ESCITALOPRAM OXALATE 10 MG PO TABS
10.0000 mg | ORAL_TABLET | Freq: Every day | ORAL | Status: DC
  Administered 2021-08-16 – 2021-08-17 (×2): 10 mg via ORAL
  Filled 2021-08-15 (×2): qty 1

## 2021-08-15 MED ORDER — INSULIN ASPART 100 UNIT/ML IJ SOLN
4.0000 [IU] | Freq: Three times a day (TID) | INTRAMUSCULAR | Status: DC
  Administered 2021-08-15 – 2021-08-16 (×5): 4 [IU] via SUBCUTANEOUS

## 2021-08-15 MED ORDER — RIVAROXABAN 15 MG PO TABS
15.0000 mg | ORAL_TABLET | Freq: Every day | ORAL | Status: DC
  Administered 2021-08-16 – 2021-08-17 (×2): 15 mg via ORAL
  Filled 2021-08-15 (×2): qty 1

## 2021-08-15 MED ORDER — AMLODIPINE BESYLATE 5 MG PO TABS
10.0000 mg | ORAL_TABLET | Freq: Every day | ORAL | Status: DC
  Administered 2021-08-15 – 2021-08-17 (×3): 10 mg via ORAL
  Filled 2021-08-15 (×3): qty 2

## 2021-08-15 MED ORDER — ISOSORBIDE MONONITRATE ER 30 MG PO TB24
120.0000 mg | ORAL_TABLET | Freq: Every day | ORAL | Status: DC
  Administered 2021-08-15 – 2021-08-17 (×3): 120 mg via ORAL
  Filled 2021-08-15 (×3): qty 4

## 2021-08-15 MED ORDER — MIRTAZAPINE 15 MG PO TABS
15.0000 mg | ORAL_TABLET | Freq: Every evening | ORAL | Status: DC | PRN
  Filled 2021-08-15: qty 1

## 2021-08-15 MED ORDER — BUPROPION HCL ER (XL) 150 MG PO TB24
150.0000 mg | ORAL_TABLET | Freq: Every morning | ORAL | Status: DC
  Administered 2021-08-16 – 2021-08-17 (×2): 150 mg via ORAL
  Filled 2021-08-15 (×2): qty 1

## 2021-08-15 MED ORDER — BICTEGRAVIR-EMTRICITAB-TENOFOV 50-200-25 MG PO TABS
1.0000 | ORAL_TABLET | Freq: Every day | ORAL | Status: DC
  Administered 2021-08-15 – 2021-08-17 (×3): 1 via ORAL
  Filled 2021-08-15 (×4): qty 1

## 2021-08-15 MED ORDER — ATORVASTATIN CALCIUM 40 MG PO TABS
40.0000 mg | ORAL_TABLET | Freq: Every day | ORAL | Status: DC
  Administered 2021-08-15 – 2021-08-17 (×3): 40 mg via ORAL
  Filled 2021-08-15 (×3): qty 1

## 2021-08-15 NOTE — Progress Notes (Signed)
CSW informed that pt meets criteria of geriatric inpatient behavioral health placement. Dr. Alethia Berthold is aware of referral and reports that pt needs a psych evaluation as of 4:50pm via secure chat. As of 5:16pm pt's CCA is completed and the requirement remains, per Merlyn Lot NP, patient is recommended for geri psych inpatient treatment.  This CSW will request that 1st shift CSW follow up with [facility] about placement in the morning.    Benjaman Kindler, MSW, [facility] 08/15/2021 10:41 PM

## 2021-08-15 NOTE — ED Notes (Signed)
Pt resting comfortably at this time.

## 2021-08-15 NOTE — ED Notes (Signed)
Pt wanded. Belongings placed in purple zone behind the door next to lockers

## 2021-08-15 NOTE — ED Notes (Signed)
This RN rounded on pt, pt visualized sleeping in bed at this time. Visible chest rise and fall noted. Pt appears in NAD.

## 2021-08-15 NOTE — ED Provider Notes (Signed)
  Physical Exam  BP 135/75 (BP Location: Left Arm)   Pulse 81   Temp 98 F (36.7 C) (Oral)   Resp 16   Ht 5\' 8"  (1.727 m)   Wt 70.3 kg   SpO2 100%   BMI 23.57 kg/m   Physical Exam  ED Course/Procedures   Clinical Course as of 08/15/21 0855  Tue Aug 14, 2021  2023 CBC with Differential(!) Anemia noted.  Similar to previous values [JK]  2024 Previous records reviewed.  Patient was at [facility] emergency department yesterday with complaints of chest pain after smoking crack [JK]  2247 Troponin elevated but similar to previous values. [JK]  2248 Creatinine chronically elevated [JK]  2248 Chest x-ray without acute findings [JK]  2248 Foot x-rays without acute abnormality [JK]  2250 Labs reviewed from visit at [facility] yesterday.  Patient's creatinine was elevated 2.38.  Patient's troponin T's were also elevated in the 300s [JK]    Clinical Course User Index [JK] [name], MD    Procedures  MDM  Patient suicidal.  Pending psychiatric evaluation.  Medically cleared.  Labs abnormal but at baseline.  Pending review of home medicines also so they can be ordered.  Patient is voluntary at this time       Davonna Belling, MD 08/15/21 [phone]

## 2021-08-15 NOTE — BH Assessment (Signed)
Comprehensive Clinical Assessment (CCA) Note  08/15/2021 Michael [name] [id number]  Disposition: Per Merlyn Lot NP, patient is recommended for geri psych inpatient treatment.   [place] ED from 08/14/2021 in [place] ED to Hosp-Admission (Discharged) from 04/03/2021 in [place] [facility] ED from 12/12/2019 in [place] DEPT  C-SSRS RISK CATEGORY Moderate Risk No Risk High Risk       The patient demonstrates the following risk factors for suicide: Chronic risk factors for suicide include: psychiatric disorder of depression, substance use disorder, previous suicide attempts in the past, previous self-harm cutting, and demographic factors (male, >3 y/o). Acute risk factors for suicide include: family or marital conflict and loss (financial, interpersonal, professional). Protective factors for this patient include:  none reported . Considering these factors, the overall suicide risk at this point appears to be moderate. Patient is not appropriate for outpatient follow up.  Michael [name] is a 70 year old male presenting to [facility] voluntarily initially reporting chest pain, pain from amputated toe two months ago and then reports worsening depression for years with suicidal ideations. Patient reports at times he really wants to die but today he denies having a plan on how he will kill himself and states "whatever happens". Patient reports cutting his wrist about 6 months ago and he also reports "blacking out" and when he comes to, he is walking in traffic. Patient reports stressors of pain in his body, being homeless and reports his wife died 2 years ago from Cancer. Patient reports he has been living in a hotel and now he does not have enough money for a room so he will have to sleep outside. Patient reports he has two adult children, and the closes family is his daughter in [place]. Patient reports he does not have  a good relationship with his children.   Patient denies having outpatient services and he is not taking any psychotropic medications. Patient reports depressive symptoms of crying, irritability, feeling hopeless, helpless worthless sleeping 2 hours a night and having a poor appetite. Patient reports history of inpatient treatment and rehab treatment years ago. Patient reports history of cocaine use and reports his last time using crack was about a month ago despite having a positive UDS. Patient current on disability for mental and physical health.    Patient is oriented x4, alert, engaged and cooperative during assessment. Patient eye contact and speech is clear. Patient affect is depressed with congruent mood. Patient reports SI without a plan but is unable to contract for safety. Patient reports HI however, does not have an identifiable person, plan or intent. Patient denies AVH and paranoia but reports history of AVH.   Chief Complaint:  Chief Complaint  Patient presents with   Shortness of Breath   Toe Pain   Suicidal   Visit Diagnosis: MDD, severe, recurrent without psychotic features   CCA Screening, Triage and Referral (STR)  Patient Reported Information How did you hear about [place]? No data recorded What Is the Reason for Your Visit/Call Today? Suicidal  How Long Has This Been Causing You Problems? 1-6 months  What Do You Feel Would Help You the Most Today? Treatment for Depression or other mood problem   Have You Recently Had Any Thoughts About Hurting Yourself? Yes  Are You Planning to Commit Suicide/Harm Yourself At This time? No   Have you Recently Had Thoughts About [place]? Yes  Are You Planning to Harm Someone at This Time?  No  Explanation: No data recorded  Have You Used Any Alcohol or Drugs in the Past 24 Hours? No  How Long Ago Did You Use Drugs or Alcohol? No data recorded What Did You Use and How Much? No data recorded  Do You Currently Have a  Therapist/Psychiatrist? No  Name of Therapist/Psychiatrist: No data recorded  Have You Been Recently Discharged From Any Office Practice or Programs? No  Explanation of Discharge From Practice/Program: No data recorded    CCA Screening Triage Referral Assessment Type of Contact: Tele-Assessment  Telemedicine Service Delivery: Telemedicine service delivery: This service was provided via telemedicine using a 2-way, interactive audio and video technology  Is this Initial or Reassessment? Initial Assessment  Date Telepsych consult ordered in [facility]:  08/14/21  Time Telepsych consult ordered in [facility]:  No data recorded Location of Assessment: [facility] ED  Provider Location: [facility] Assessment Services   Collateral Involvement: none   Does Patient Have a [place]? No data recorded Name and Contact of Legal Guardian: No data recorded If Minor and Not Living with Parent(s), Who has Custody? No data recorded Is CPS involved or ever been involved? No data recorded Is APS involved or ever been involved? No data recorded  Patient Determined To Be At Risk for Harm To Self or Others Based on Review of Patient Reported Information or Presenting Complaint? Yes, for Self-Harm  Method: No data recorded Availability of Means: No data recorded Intent: No data recorded Notification Required: No data recorded Additional Information for Danger to Others Potential: No data recorded Additional Comments for Danger to Others Potential: No data recorded Are There Guns or Other Weapons in Your Home? No data recorded Types of Guns/Weapons: No data recorded Are These Weapons Safely Secured?                            No data recorded Who Could Verify You Are Able To Have These Secured: No data recorded Do You Have any Outstanding Charges, Pending Court Dates, Parole/Probation? No data recorded Contacted To Inform of Risk of Harm To Self or Others: No data recorded   Does Patient Present  under Involuntary Commitment? No  IVC Papers Initial File Date: No data recorded  [place] of Residence: [place]   Patient Currently Receiving the Following Services: No data recorded  Determination of Need: Urgent (48 hours)   Options For Referral: Medication Management; Outpatient Therapy; Inpatient Hospitalization     CCA Biopsychosocial Patient Reported Schizophrenia/Schizoaffective Diagnosis in Past: No   Strengths: No data recorded  Mental Health Symptoms Depression:   Change in energy/activity; Hopelessness; Increase/decrease in appetite; Irritability; Sleep (too much or little); Tearfulness; Worthlessness   Duration of Depressive symptoms:  Duration of Depressive Symptoms: Greater than two weeks   Mania:   None   Anxiety:    None   Psychosis:   None   Duration of Psychotic symptoms:    Trauma:   None   Obsessions:   None   Compulsions:   None   Inattention:   None   Hyperactivity/Impulsivity:   None   Oppositional/Defiant Behaviors:   None   Emotional Irregularity:   None   Other Mood/Personality Symptoms:  No data recorded   Mental Status Exam Appearance and self-care  Stature:   Average   Weight:   Average weight   Clothing:   Age-appropriate   Grooming:   Normal   Cosmetic use:   None  Posture/gait:   Normal   Motor activity:   Not Remarkable   Sensorium  Attention:   Normal   Concentration:   Normal   Orientation:   Person; Place; Situation   Recall/memory:   Normal   Affect and Mood  Affect:   Depressed   Mood:   Depressed   Relating  Eye contact:   Normal   Facial expression:   Depressed   Attitude toward examiner:   Cooperative   Thought and Language  Speech flow:  Clear and Coherent   Thought content:   Appropriate to Mood and Circumstances   Preoccupation:   None   Hallucinations:   None   Organization:  No data recorded  [employer] of Knowledge:   Fair    Intelligence:   Average   Abstraction:   Normal   Judgement:   Fair   Art therapist:   Adequate   Insight:   Fair   Decision Making:   Normal   Social Functioning  Social Maturity:   Irresponsible   Social Judgement:   "Games developer"   Stress  Stressors:   Housing; Illness   Coping Ability:   Programme researcher, broadcasting/film/video Deficits:  No data recorded  Supports:   Support needed     Religion:    Leisure/Recreation:    Exercise/Diet: Exercise/Diet Have You Gained or Lost A Significant Amount of Weight in the Past Six Months?: Yes-Lost Do You Follow a Special Diet?: No Do You Have Any Trouble Sleeping?: Yes   CCA Employment/Education Employment/Work Situation: Employment / Work Situation Employment Situation: On disability Why is Patient on Disability: mental and physical Patient's Job has Been Impacted by Current Illness: No Has Patient ever Been in the [employer]?: No  Education: Education Is Patient Currently Attending School?: No   CCA Family/Childhood History Family and Relationship History: Family history Marital status: Widowed Widowed, when?: 2020 Does patient have children?: Yes How many children?: 2 How is patient's relationship with their children?: not good  Childhood History:  Childhood History By whom was/is the patient raised?: Both parents Did patient suffer any verbal/emotional/physical/sexual abuse as a child?: No Has patient ever been sexually abused/assaulted/raped as an adolescent or adult?: No Witnessed domestic violence?: No Has patient been affected by domestic violence as an adult?: No  Child/Adolescent Assessment:     CCA Substance Use Alcohol/Drug Use: Alcohol / Drug Use Pain Medications: See MAR Prescriptions: See MAR Over the Counter: See MAR History of alcohol / drug use?: Yes Longest period of sobriety (when/how long): unknown Negative Consequences of Use: Personal relationships Withdrawal Symptoms:  Patient aware of relationship between substance abuse and physical/medical complications Substance #1 Name of Substance 1: cocaine 1 - Last Use / Amount: a month ago                       ASAM's:  Six Dimensions of Multidimensional Assessment  Dimension 1:  Acute Intoxication and/or Withdrawal Potential:      Dimension 2:  Biomedical Conditions and Complications:      Dimension 3:  Emotional, Behavioral, or Cognitive Conditions and Complications:     Dimension 4:  Readiness to Change:     Dimension 5:  Relapse, Continued use, or Continued Problem Potential:     Dimension 6:  Recovery/Living Environment:     ASAM Severity Score:    ASAM Recommended Level of Treatment: ASAM Recommended Level of Treatment: Level II Intensive Outpatient Treatment   Substance  use Disorder (SUD)    Recommendations for Services/Supports/Treatments: Recommendations for Services/Supports/Treatments Recommendations For Services/Supports/Treatments: CST Engineer, building services Team), SAIOP (Substance Abuse Intensive Outpatient Program)  Discharge Disposition:    DSM5 Diagnoses: Patient Active Problem List   Diagnosis Date Noted   Iron deficiency anemia 04/04/2021   Atypical chest pain 04/03/2021   Homelessness 12/13/2019   Cellulitis 12/07/2019   Osteomyelitis ([place]) 07/17/2019   Abscess or cellulitis of toe, right    Toe pain, right-second    MDD (major depressive disorder), recurrent episode, severe ([place]) 06/15/2019   Respiratory distress 04/12/2019   Pneumonia due to severe acute respiratory syndrome coronavirus 2 (SARS-CoV-2) 04/11/2019   COVID-19 virus infection 03/19/2019   Chest pain 10/03/2018   Malnutrition of moderate degree 09/21/2018   MDD (major depressive disorder), recurrent episode, moderate ([name])    Type II diabetes mellitus with renal manifestations ([place]) 05/04/2018   GERD (gastroesophageal reflux disease) 05/04/2018   S/P CABG (coronary artery bypass graft)    Left  testicular pain    Depression 02/20/2018   Epididymo-orchitis, acute 02/20/2018   Essential hypertension 02/20/2018   Precordial chest pain 02/20/2018   AKI (acute kidney injury) ([place]) 02/14/2018   HCAP (healthcare-associated pneumonia) 02/14/2018   History of pulmonary embolism 07/04/2017   Acute hyponatremia 05/11/2017   Hyperglycemia due to type 2 diabetes mellitus ([place]) 05/11/2017   CHF (congestive heart failure) ([place]) 04/01/2017   Hepatitis C 12/30/2016   Chronic ischemic heart disease 11/12/2016   Paroxysmal A-fib ([place]) 11/12/2016   Back pain 04/18/2016   S/P carotid endarterectomy 11/15/2015   MDD (major depressive disorder), recurrent severe, without psychosis ([place]) 09/09/2015   Cocaine-induced mood disorder ([place]) 08/14/2015   Cocaine abuse with cocaine-induced mood disorder ([place]) 08/14/2015   Gout 07/10/2015   Acute renal failure superimposed on stage 3 chronic kidney disease ([place]) 03/06/2015   Anemia 03/06/2015   Chronic kidney disease, stage III (moderate) ([place]) 03/06/2015   Hypoglycemia    Encounter for general adult medical examination with abnormal findings 02/09/2015   Cocaine use disorder, severe, dependence ([place]) 12/13/2014   Substance or medication-induced depressive disorder with onset during withdrawal ([place]) 12/13/2014   Severe recurrent major depressive disorder with psychotic features ([place]) 12/12/2014   Cervicalgia 06/28/2014   Lumbar radiculopathy, chronic 06/28/2014   Asthma, chronic 02/03/2014   3-vessel CAD 06/24/2013   ED (erectile dysfunction) of organic origin 07/07/2012   Hypertension goal BP (blood pressure) < 140/80 04/29/2012   Chondromalacia of left knee 03/19/2012   Hyperlipidemia with target LDL less than 100 02/12/2012   Fibromyalgia 02/12/2012   HIV (human immunodeficiency virus infection) ([place]) 08/27/2011   Uncontrolled type 2 diabetes with neuropathy 10/17/2000     Referrals to Alternative Service(s): Referred to Alternative  Service(s):   Place:   Date:   Time:    Referred to Alternative Service(s):   Place:   Date:   Time:    Referred to Alternative Service(s):   Place:   Date:   Time:    Referred to Alternative Service(s):   Place:   Date:   Time:     Luther Redo, [facility]

## 2021-08-16 LAB — COMPREHENSIVE METABOLIC PANEL
ALT: 15 U/L (ref 0–44)
AST: 17 U/L (ref 15–41)
Albumin: 2.7 g/dL — ABNORMAL LOW (ref 3.5–5.0)
Alkaline Phosphatase: 66 U/L (ref 38–126)
Anion gap: 4 — ABNORMAL LOW (ref 5–15)
BUN: 29 mg/dL — ABNORMAL HIGH (ref 8–23)
CO2: 24 mmol/L (ref 22–32)
Calcium: 8 mg/dL — ABNORMAL LOW (ref 8.9–10.3)
Chloride: 108 mmol/L (ref 98–111)
Creatinine, Ser: 2.3 mg/dL — ABNORMAL HIGH (ref 0.61–1.24)
GFR, Estimated: 30 mL/min — ABNORMAL LOW (ref >60)
Glucose, Bld: 232 mg/dL — ABNORMAL HIGH (ref 70–99)
Potassium: 4.4 mmol/L (ref 3.5–5.1)
Sodium: 136 mmol/L (ref 135–145)
Total Bilirubin: 0.5 mg/dL (ref 0.3–1.2)
Total Protein: 5.7 g/dL — ABNORMAL LOW (ref 6.5–8.1)

## 2021-08-16 LAB — CBG MONITORING, ED
Glucose-Capillary: 216 mg/dL — ABNORMAL HIGH (ref 70–99)
Glucose-Capillary: 140 mg/dL — ABNORMAL HIGH (ref 70–99)

## 2021-08-16 NOTE — ED Provider Notes (Signed)
Emergency Medicine Observation Re-evaluation Note  Michael [name] is a 70 y.o. male, seen on rounds today.  Pt initially presented to the ED for complaints of Shortness of Breath, Toe Pain, and Suicidal Currently, the patient is resting.  Physical Exam  BP 110/67 (BP Location: Left Arm)   Pulse 79   Temp 98.1 F (36.7 C) (Oral)   Resp 19   Ht 5\' 8"  (1.727 m)   Wt 70.8 kg   SpO2 99%   BMI 23.72 kg/m  Physical Exam General: resting comfortably, NAD Lungs: normal WOB Psych: currently calm and resting  ED Course / MDM  EKG:EKG Interpretation  Date/Time:  Tuesday August 14 2021 19:01:58 EST Ventricular Rate:  86 PR Interval:  136 QRS Duration: 68 QT Interval:  350 QTC Calculation: 418 R Axis:   49 Text Interpretation: Sinus rhythm with marked sinus arrhythmia ST & T wave abnormality, consider lateral ischemia Abnormal ECG No significant change since last tracing Confirmed by [name] [phone]) on 08/14/2021 10:49:10 PM  I have reviewed the labs performed to date as well as medications administered while in observation.  Recent changes in the last 24 hours include none.  Plan  Current plan is for geri psych admission, pending placement.  Michael [name] is not under involuntary commitment.     Lorelle Gibbs, DO 08/16/21 [id number]

## 2021-08-16 NOTE — Progress Notes (Signed)
Patient has been denied by [facility] due to no appropriate beds available. Patient meets [facility] inpatient criteria per Merlyn Lot, NP. Patient has been faxed out to appropriate geri-psych inpatient facilities.   Mariea Clonts, MSW, LCSW-A  10:38 AM 08/16/2021

## 2021-08-17 DIAGNOSIS — F1410 Cocaine abuse, uncomplicated: Secondary | ICD-10-CM | POA: Diagnosis not present

## 2021-08-17 DIAGNOSIS — R45851 Suicidal ideations: Secondary | ICD-10-CM

## 2021-08-17 LAB — CBG MONITORING, ED: Glucose-Capillary: 81 mg/dL (ref 70–99)

## 2021-08-17 NOTE — ED Provider Notes (Signed)
Patient seen by psychiatry team and cleared for continued outpatient care.  Recommending immediate return for worsening symptoms or any additional concerns such as fevers cough vomiting or pain.   Luna Fuse, MD 08/17/21 [phone]

## 2021-08-17 NOTE — ED Notes (Signed)
Pt given AVS paperwork, explained & understood all d/c instructions. All belongings returned & he is dressing in clothes at this time.

## 2021-08-17 NOTE — Consult Note (Signed)
[facility] Psych ED Discharge  08/17/2021 11:25 AM Michael [name]  MRN:  [id number]  Method of visit?: Face to Face   Principal Problem: <principal problem not specified> Discharge Diagnoses: Active Problems:   * No active hospital problems. *   Subjective: Michael [name] is a 70 year old male with history of CAD, status post CABG, cocaine abuse, DM type 2, HTN, diabetic foot infection, anemia. Patient was seen at [facility] 08/13/21, for similar presentation chest pain, substance use disorder.  He was subsequently discharged with cocaine use disorder severe and malingering.  He presented to [facility] immediately after discharge.  Patient is seen and reassessed this nurse practitioner.  He continues to endorse passive suicidal ideations, in the setting of chronic substance abuse.  He is wanting help for his substance use and depression.  Upon reassessment today patient appears to be resting in stretcher, reports smoking cocaine daily.  He denies any additional substance abuse at this time.  Patient reports he is currently homeless, and is requesting assistance for different treatment options. His suicidal ideations are chonic in nature and lileky related to his substance abuse. He denies any recent or current self-harm, or suicidal thoughts with a plan.  He is able to contract for safety.  He further denies homicidal ideations and or hallucinations at this time.  Michael [name] is a 70 y.o.  with a history significant for malingering, polysubstance abuse, and depression, who presented to the ED for chest pain secondary to cocaine use, suicidal thoughts with no plan.  Of note, he has he was just discharged from [facility] for chest pain and malingering, cocaine use disorder.  Patient has a poor history of following up with his outpatient resources, but continues to present to the emergency department.  Given this extensive history, it appears that inpatient psychiatric hospitalization  would not be very beneficial and the patient is coming to the emergency department for some type of secondary gain.   As notes above, the patient's statement that he will attempt suicide if discharged appears to be an expression of unmet needs (housing, pain management) that is representative of limited and often-maladaptive coping and skills, rather than an indicator of imminent risk of death.  Patient's treatment goals of housing and pain relief are unobtainable in the short-term inpatient setting. Any safety benefit of hospitalization is mitigated by patient's lack of collaboration and engagement with the treatment team. Continued treatment in the hospital is delaying patient's engagement with outpatient services and the period during which patient must demonstrate outpatient stability to be eligible for housing. Continued hospitalization is no longer benefiting the patient and may be unnecessarily delaying effective intervention.Patient is unable to identify social supports for the team to contact to assist in the short-term.  Patient continues to present for concern for malingering. Patient reported that his SI is likely due to substance use, and has already mentioned that his homelessness is a large contribution to his depression. Provider has also noticed that patient's affect appears more positive now and patient speak more on assessment and is more interactive. Patient has a hx of long psychiatric stays with little improvement and need for housing. Patient is determined to be stable for discharge as he appears to be malingering.    Total Time spent with patient: 30 minutes  Past Psychiatric History: Depression, substance abuse.  Previous psychiatric medication trials include Wellbutrin, Lexapro, Remeron, trazodone.  History of chronic passive suicidal ideations, attempt by cutting years ago.  No  suicide attempts of high lethality that resulted in medical intervention.  No recent inpatient  hospitalizations, or rehab admissions.  Past Medical History:  Past Medical History:  Diagnosis Date   A-fib ([place])    Asthma    No PFTs, history of childhood asthma   CAD (coronary artery disease)    a. 06/2013 STEMI/PCI ([facility]): LAD w/ thrombus (treated with BMS), mid 75%, D2 75%; LCX OM2 75%; RCA small, PDA 95%, PLV 95%;  b. 10/2013 Cath/PCI: ISR w/in LAD (Promus DES x 2), borderline OM2 lesion;  c. 01/2014 MV: Intermediate risk, medium-sized distal ant wall infarct w/ very small amt of peri-infarct ischemia. EF 60%.   Cellulitis 04/2014   left facial   Cellulitis and abscess of toe of right foot 12/08/2019   Chondromalacia of medial femoral condyle    Left knee MRI 04/28/12: Chondromalacia of the medial femoral condyle with slight peripheral degeneration of the meniscocapsular junction of the medial meniscus; followed by sports medicine   Collagen vascular disease ([place])    Crack cocaine use    for 20+ years, has been enrolled in detox programs in the past   Depression    with history of hospitalization for suicidal ideation   Diabetes mellitus 2002   Diagnosed in 2002, started insulin in 2012   Gout    Gout 04/28/2012   Headache(784.0)    CT head 08/2011: Periventricular and subcortical white matter hypodensities are most in keeping with chronic microangiopathic change   HIV infection [facility]) Nov 2012   Followed by [name]   Hyperlipidemia    Hypertension    Pulmonary embolism [facility])     Past Surgical History:  Procedure Laterality Date   AMPUTATION Right 07/21/2019   Procedure: RIGHT SECOND TOE AMPUTATION;  Surgeon: [name], MD;  Location: [place];  Service: Orthopedics;  Laterality: Right;   BACK SURGERY     1988   BOWEL RESECTION     CARDIAC SURGERY     CERVICAL SPINE SURGERY     " rods in my neck "   CORONARY ARTERY BYPASS GRAFT     CORONARY STENT PLACEMENT     NM MYOCAR PERF WALL MOTION  12/27/2011   normal   SPINE SURGERY     Family History:  Family History   Problem Relation Age of Onset   Diabetes Mother    Hypertension Mother    Hyperlipidemia Mother    Diabetes Father    Cancer Father    Hypertension Father    Diabetes Brother    Heart disease Brother    Diabetes Sister    Colon cancer Neg Hx    Family Psychiatric  History: Denies Social History:  Social History   Substance and Sexual Activity  Alcohol Use No   Alcohol/week: 4.0 standard drinks   Types: 2 Cans of beer, 2 Shots of liquor per week     Social History   Substance and Sexual Activity  Drug Use Not Currently   Frequency: 4.0 times per week   Types: "Crack" cocaine, Cocaine   Comment: Recent use of crack    Social History   Socioeconomic History   Marital status: Widowed    Spouse name: Not on file   Number of children: 2   Years of education: 20   Highest education level: 12th grade  Occupational History    Employer: UNEMPLOYED    Comment: 04/2016  Tobacco Use   Smoking status: Never   Smokeless tobacco: Never  Vaping Use   Vaping Use: Never used  Substance and Sexual Activity   Alcohol use: No    Alcohol/week: 4.0 standard drinks    Types: 2 Cans of beer, 2 Shots of liquor per week   Drug use: Not Currently    Frequency: 4.0 times per week    Types: "Crack" cocaine, Cocaine    Comment: Recent use of crack   Sexual activity: Yes    Comment: DECLINED CONDOMS  Other Topics Concern   Not on file  Social History Narrative   Currently staying with a friend in [place].  Was staying @ local motel until a few days ago - left b/c of bed bugs.   P[facility] up from [facility].  Not followed by a psychiatrist.   Social Determinants of Health   Financial Resource Strain: Not on file  Food Insecurity: Not on file  Transportation Needs: Not on file  Physical Activity: Not on file  Stress: Not on file  Social Connections: Not on file    Tobacco Cessation:  N/A, patient does not currently use tobacco products  Current Medications: Current  Facility-Administered Medications  Medication Dose Route Frequency Provider Last Rate Last Admin   amLODipine (NORVASC) tablet 10 mg  10 mg Oral Daily Cardama, Grayce Sessions, MD   10 mg at 08/16/21 0900   atorvastatin (LIPITOR) tablet 40 mg  40 mg Oral Daily Cardama, Grayce Sessions, MD   40 mg at 08/16/21 0900   bictegravir-emtricitabine-tenofovir AF (BIKTARVY) 50-200-25 MG per tablet 1 tablet  1 tablet Oral Daily Cardama, Grayce Sessions, MD   1 tablet at 08/16/21 0902   buPROPion (WELLBUTRIN XL) 24 hr tablet 150 mg  150 mg Oral q morning [name], MD   150 mg at 08/16/21 0901   escitalopram (LEXAPRO) tablet 10 mg  10 mg Oral Daily [name], MD   10 mg at 08/16/21 0901   insulin aspart (novoLOG) injection 4 Units  4 Units Subcutaneous TID WC Fatima Blank, MD   4 Units at 08/16/21 1752   insulin glargine-yfgn (SEMGLEE) injection 30 Units  30 Units Subcutaneous Daily Cardama, Grayce Sessions, MD   30 Units at 08/16/21 0902   isosorbide mononitrate (IMDUR) 24 hr tablet 120 mg  120 mg Oral Daily Cardama, Grayce Sessions, MD   120 mg at 08/16/21 0900   mirtazapine (REMERON) tablet 15 mg  15 mg Oral QHS PRN [name], MD       Rivaroxaban Alveda Reasons) tablet 15 mg  15 mg Oral Daily [name], MD   15 mg at 08/16/21 0901   traZODone (DESYREL) tablet 50 mg  50 mg Oral QHS PRN [name], MD       Current Outpatient Medications  Medication Sig Dispense Refill   albuterol (VENTOLIN HFA) 108 (90 Base) MCG/ACT inhaler Inhale 1-2 puffs into the lungs every 6 (six) hours as needed for wheezing or shortness of breath. 8 g 1   allopurinol (ZYLOPRIM) 100 MG tablet Take 100 mg by mouth daily.     amLODipine (NORVASC) 10 MG tablet Take 1 tablet (10 mg total) by mouth daily. 60 tablet 2   atorvastatin (LIPITOR) 40 MG tablet Take 1 tablet (40 mg total) by mouth daily. 60 tablet 2   bictegravir-emtricitabine-tenofovir AF (BIKTARVY) 50-200-25 MG TABS tablet Take 1 tablet by mouth daily. 30 tablet 11    budesonide-formoterol (SYMBICORT) 160-4.5 MCG/ACT inhaler Inhale 2 puffs into the lungs in the morning and at bedtime.     buPROPion (  WELLBUTRIN XL) 150 MG 24 hr tablet Take 150 mg by mouth every morning.     diclofenac Sodium (VOLTAREN) 1 % GEL Apply 2-4 g topically daily as needed (shoulder/knee pain).     escitalopram (LEXAPRO) 10 MG tablet Take 10 mg by mouth daily.     famotidine (PEPCID) 20 MG tablet Take 20 mg by mouth daily as needed for heartburn or indigestion.     insulin glargine (LANTUS) 100 UNIT/ML Solostar Pen Inject 30 Units into the skin daily. (Patient taking differently: Inject 20 Units into the skin at bedtime.) 15 mL 1   insulin lispro (HUMALOG KWIKPEN) 100 UNIT/ML KwikPen Inject 0.04 mLs (4 Units total) into the skin 3 (three) times daily with meals. 15 mL 0   Insulin Pen Needle 31G X 5 MM MISC 4 Units by Does not apply route in the morning, at noon, and at bedtime. 100 each 0   isosorbide mononitrate (IMDUR) 60 MG 24 hr tablet Take 2 tablets (120 mg total) by mouth daily. 60 tablet 2   lidocaine (LIDODERM) 5 % Place 1 patch onto the skin daily as needed (For chestwall pain). Remove & Discard patch within 12 hours or as directed by MD 30 patch 0   losartan (COZAAR) 50 MG tablet Take 50 mg by mouth daily.     mirtazapine (REMERON) 15 MG tablet Take 15 mg by mouth at bedtime as needed (sleep).     nitroGLYCERIN (NITROSTAT) 0.4 MG SL tablet Place 1 tablet (0.4 mg total) under the tongue every 5 (five) minutes as needed for chest pain. 10 tablet 0   traZODone (DESYREL) 50 MG tablet Take 50 mg by mouth at bedtime as needed for sleep.     XARELTO 15 MG TABS tablet Take 15 mg by mouth daily.     doxycycline (MONODOX) 100 MG capsule Take 100 mg by mouth 2 (two) times daily. (Patient not taking: No sig reported)     PTA Medications: (Not in a hospital admission)   Musculoskeletal: Strength & Muscle Tone:  Denies Gait & Station: normal Patient leans: N/A  Psychiatric Specialty  Exam:  Presentation  General Appearance: Appropriate for Environment; Casual Eye Contact:Fair Speech:Clear and Coherent; Normal Rate Speech Volume:Normal Handedness:Right  Mood and Affect  Mood:-- (ok) Affect:Appropriate; Congruent  Thought Process  Thought Processes:Coherent; Linear; Goal Directed Descriptions of Associations:Intact Orientation:Full (Time, Place and Person) Thought Content:Logical History of Schizophrenia/Schizoaffective disorder:No  Duration of Psychotic Symptoms:No data recorded Hallucinations:Hallucinations: None Ideas of Reference:None Suicidal Thoughts:Suicidal Thoughts: Yes, Passive SI Passive Intent and/or Plan: Without Intent; Without Plan; Without Means to Carry Out; Without Access to Means Homicidal Thoughts:Homicidal Thoughts: No  Sensorium  Memory:Immediate Good; Recent Good; Remote Good Judgment:Fair Insight:Fair  Executive Functions  Concentration:Fair Attention Span:Fair [place]  Psychomotor Activity  Psychomotor Activity:Psychomotor Activity: Normal  Assets  Assets:Communication Skills; Desire for Improvement; Housing; Catering manager; Leisure Time; Physical Health; Talents/Skills  Sleep  Sleep:Sleep: Fair   Physical Exam: Physical Exam Vitals reviewed.  Neurological:     General: No focal deficit present.     Mental Status: He is oriented to person, place, and time.  Psychiatric:        Mood and Affect: Mood normal.        Behavior: Behavior normal.   Review of Systems  Psychiatric/Behavioral:  Positive for substance abuse (cocaine) and suicidal ideas (passive).   All other systems reviewed and are negative. Blood pressure (!) 161/90, pulse 77, temperature 98.2 F (36.8 C), temperature  source Oral, resp. rate 18, height 5\' 8"  (1.727 m), weight 70.8 kg, SpO2 100 %. Body mass index is 23.72 kg/m.   Demographic Factors:  Male  Loss Factors: Decrease in  vocational status, Decline in physical health, and Financial problems/change in socioeconomic status  Historical Factors: Prior suicide attempts  Risk Reduction Factors:   Sense of responsibility to family, Positive social support, Positive therapeutic relationship, and Positive coping skills or problem solving skills  Continued Clinical Symptoms:  Alcohol/Substance Abuse/Dependencies Unstable or Poor Therapeutic Relationship Previous Psychiatric Diagnoses and Treatments Medical Diagnoses and Treatments/Surgeries  Cognitive Features That Contribute To Risk:  None    Suicide Risk:  Minimal: No identifiable suicidal ideation.  Patients presenting with no risk factors but with morbid ruminations; may be classified as minimal risk based on the severity of the depressive symptoms    Plan Of Care/Follow-up recommendations:  Other:  Psych clear. May need TOC for transportation or voucher to shelter, and consider referrals to substance abuse facility.  Continue current medications.   Disposition: Psych Cleared.  Suella Broad, FNP 08/17/2021, 11:25 AM

## 2021-08-17 NOTE — ED Notes (Signed)
Pt dressed out in disposable scrubs & bus pass was given at time of departure.

## 2021-08-17 NOTE — Discharge Instructions (Signed)
Call your primary care doctor or specialist as discussed in the next 2-3 days.   Return immediately back to the ER if:  Your symptoms worsen within the next 12-24 hours. You develop new symptoms such as new fevers, persistent vomiting, new pain, shortness of breath, or new weakness or numbness, or if you have any other concerns.  

## 2021-08-19 ENCOUNTER — Other Ambulatory Visit: Payer: Self-pay

## 2021-08-19 ENCOUNTER — Emergency Department (HOSPITAL_COMMUNITY)
Admission: EM | Admit: 2021-08-19 | Discharge: 2021-08-20 | Disposition: A | Discharge status: Home / Self Care | Payer: Medicare Other | Attending: Emergency Medicine | Admitting: Emergency Medicine

## 2021-08-19 ENCOUNTER — Encounter (HOSPITAL_COMMUNITY): Payer: Self-pay

## 2021-08-19 DIAGNOSIS — Z79899 Other long term (current) drug therapy: Secondary | ICD-10-CM | POA: Diagnosis not present

## 2021-08-19 DIAGNOSIS — M791 Myalgia, unspecified site: Secondary | ICD-10-CM | POA: Diagnosis not present

## 2021-08-19 DIAGNOSIS — E119 Type 2 diabetes mellitus without complications: Secondary | ICD-10-CM | POA: Diagnosis not present

## 2021-08-19 DIAGNOSIS — Z21 Asymptomatic human immunodeficiency virus [HIV] infection status: Secondary | ICD-10-CM | POA: Diagnosis not present

## 2021-08-19 DIAGNOSIS — I2510 Atherosclerotic heart disease of native coronary artery without angina pectoris: Secondary | ICD-10-CM | POA: Insufficient documentation

## 2021-08-19 DIAGNOSIS — R079 Chest pain, unspecified: Secondary | ICD-10-CM | POA: Insufficient documentation

## 2021-08-19 DIAGNOSIS — I10 Essential (primary) hypertension: Secondary | ICD-10-CM | POA: Insufficient documentation

## 2021-08-19 DIAGNOSIS — Z951 Presence of aortocoronary bypass graft: Secondary | ICD-10-CM | POA: Insufficient documentation

## 2021-08-19 DIAGNOSIS — R008 Other abnormalities of heart beat: Secondary | ICD-10-CM | POA: Insufficient documentation

## 2021-08-19 DIAGNOSIS — J45909 Unspecified asthma, uncomplicated: Secondary | ICD-10-CM | POA: Insufficient documentation

## 2021-08-19 DIAGNOSIS — Z20822 Contact with and (suspected) exposure to covid-19: Secondary | ICD-10-CM | POA: Insufficient documentation

## 2021-08-19 DIAGNOSIS — Z794 Long term (current) use of insulin: Secondary | ICD-10-CM | POA: Diagnosis not present

## 2021-08-19 DIAGNOSIS — Z7901 Long term (current) use of anticoagulants: Secondary | ICD-10-CM | POA: Diagnosis not present

## 2021-08-19 DIAGNOSIS — G8929 Other chronic pain: Secondary | ICD-10-CM | POA: Insufficient documentation

## 2021-08-19 DIAGNOSIS — Z7951 Long term (current) use of inhaled steroids: Secondary | ICD-10-CM | POA: Diagnosis not present

## 2021-08-19 DIAGNOSIS — R059 Cough, unspecified: Secondary | ICD-10-CM | POA: Diagnosis not present

## 2021-08-19 LAB — COMPREHENSIVE METABOLIC PANEL
ALT: 17 U/L (ref 0–44)
AST: 19 U/L (ref 15–41)
Albumin: 3.5 g/dL (ref 3.5–5.0)
Alkaline Phosphatase: 77 U/L (ref 38–126)
Anion gap: 8 (ref 5–15)
BUN: 30 mg/dL — ABNORMAL HIGH (ref 8–23)
CO2: 23 mmol/L (ref 22–32)
Calcium: 8.6 mg/dL — ABNORMAL LOW (ref 8.9–10.3)
Chloride: 105 mmol/L (ref 98–111)
Creatinine, Ser: 2.08 mg/dL — ABNORMAL HIGH (ref 0.61–1.24)
GFR, Estimated: 33 mL/min — ABNORMAL LOW (ref >60)
Glucose, Bld: 264 mg/dL — ABNORMAL HIGH (ref 70–99)
Potassium: 4.4 mmol/L (ref 3.5–5.1)
Sodium: 136 mmol/L (ref 135–145)
Total Bilirubin: 0.5 mg/dL (ref 0.3–1.2)
Total Protein: 7.4 g/dL (ref 6.5–8.1)

## 2021-08-19 LAB — CBC WITH DIFFERENTIAL/PLATELET
Abs Immature Granulocytes: 0.03 10*3/uL (ref 0.00–0.07)
Basophils Absolute: 0 10*3/uL (ref 0.0–0.1)
Basophils Relative: 1 %
Eosinophils Absolute: 0.1 10*3/uL (ref 0.0–0.5)
Eosinophils Relative: 2 %
HCT: 29.1 % — ABNORMAL LOW (ref 39.0–52.0)
Hemoglobin: 9.3 g/dL — ABNORMAL LOW (ref 13.0–17.0)
Immature Granulocytes: 1 %
Lymphocytes Relative: 23 %
Lymphs Abs: 1 10*3/uL (ref 0.7–4.0)
MCH: 28.3 pg (ref 26.0–34.0)
MCHC: 32 g/dL (ref 30.0–36.0)
MCV: 88.4 fL (ref 80.0–100.0)
Monocytes Absolute: 0.3 10*3/uL (ref 0.1–1.0)
Monocytes Relative: 8 %
Neutro Abs: 2.8 10*3/uL (ref 1.7–7.7)
Neutrophils Relative %: 65 %
Platelets: 181 10*3/uL (ref 150–400)
RBC: 3.29 MIL/uL — ABNORMAL LOW (ref 4.22–5.81)
RDW: 14.6 % (ref 11.5–15.5)
WBC: 4.2 10*3/uL (ref 4.0–10.5)
nRBC: 0 % (ref 0.0–0.2)

## 2021-08-19 LAB — TROPONIN I (HIGH SENSITIVITY): Troponin I (High Sensitivity): 14 ng/L (ref <18)

## 2021-08-19 NOTE — ED Triage Notes (Signed)
Pt BIB EMS. Pt complains of chest pain x 3 days.

## 2021-08-19 NOTE — ED Provider Notes (Signed)
Emergency Medicine Provider Triage Evaluation Note  Michael [name] , a 70 y.o. male  was evaluated in triage.  Pt complains of multiple complaints-  CP onset after triple bypass last year, no new or change in symptoms Right foot wound- amputated great toe, would like looked at SI- constant x 1 year.  Came today for no identifiable reason Review of Systems  Positive: As above Negative: As above  Physical Exam  BP (!) 145/80 (BP Location: Right Arm)   Pulse 84   Temp 98.1 F (36.7 C) (Oral)   Resp 16   SpO2 100%  Gen:   Awake, no distress   Resp:  Normal effort  MSK:   Moves extremities without difficulty  Other:    Medical Decision Making  Medically screening exam initiated at 8:25 PM.  Appropriate orders placed.  [name] was informed that the remainder of the evaluation will be completed by another provider, this initial triage assessment does not replace that evaluation, and the importance of remaining in the ED until their evaluation is complete.     Tacy Learn, PA-C 08/19/21 2026    Daleen Bo, MD 08/20/21 [phone]

## 2021-08-20 LAB — RESP PANEL BY RT-PCR (FLU A&B, COVID) ARPGX2
Influenza A by PCR: NEGATIVE
Influenza B by PCR: NEGATIVE
SARS Coronavirus 2 by RT PCR: NEGATIVE

## 2021-08-20 LAB — TROPONIN I (HIGH SENSITIVITY): Troponin I (High Sensitivity): 17 ng/L (ref <18)

## 2021-08-20 NOTE — ED Provider Notes (Signed)
Michael [name] Provider Note: Michael Spurling, MD, FACEP  CSN: [id number] MRN: [id number] ARRIVAL: 08/19/21 at [place]: [place]  Chest Pain   HISTORY OF PRESENT ILLNESS  08/20/21 2:46 AM Michael [name] is a 70 y.o. male with history of HIV, CAD, renal insufficiency [place] and crack cocaine use.  He is here with left-sided chest pain that he states has been present since his CABG a year ago.  He describes the pain as sharp and worse with deep breathing or cough.  He rates the pain as a 7 out of 10.  This is his fourth visit to an emergency department this month with this complaint.  He also acknowledges body aches and general malaise but does not know if he has had a fever.  He has been coughing frequently.   Past Medical History:  Diagnosis Date   A-fib ([place])    Asthma    No PFTs, history of childhood asthma   CAD (coronary artery disease)    a. 06/2013 STEMI/PCI ([facility]): LAD w/ thrombus (treated with BMS), mid 75%, D2 75%; LCX OM2 75%; RCA small, PDA 95%, PLV 95%;  b. 10/2013 Cath/PCI: ISR w/in LAD (Promus DES x 2), borderline OM2 lesion;  c. 01/2014 MV: Intermediate risk, medium-sized distal ant wall infarct w/ very small amt of peri-infarct ischemia. EF 60%.   Cellulitis 04/2014   left facial   Cellulitis and abscess of toe of right foot 12/08/2019   Chondromalacia of medial femoral condyle    Left knee MRI 04/28/12: Chondromalacia of the medial femoral condyle with slight peripheral degeneration of the meniscocapsular junction of the medial meniscus; followed by sports medicine   Collagen vascular disease ([place])    Crack cocaine use    for 20+ years, has been enrolled in detox programs in the past   Depression    with history of hospitalization for suicidal ideation   Diabetes mellitus 2002   Diagnosed in 2002, started insulin in 2012   Gout    Gout 04/28/2012   Headache(784.0)    CT head 08/2011: Periventricular and subcortical white matter hypodensities  are most in keeping with chronic microangiopathic change   HIV infection [facility]) Nov 2012   Followed by [name]   Hyperlipidemia    Hypertension    Pulmonary embolism [facility])     Past Surgical History:  Procedure Laterality Date   AMPUTATION Right 07/21/2019   Procedure: RIGHT SECOND TOE AMPUTATION;  Surgeon: [name], MD;  Location: [facility];  Service: Orthopedics;  Laterality: Right;   BACK SURGERY     1988   BOWEL RESECTION     CARDIAC SURGERY     CERVICAL SPINE SURGERY     " rods in my neck "   CORONARY ARTERY BYPASS GRAFT     CORONARY STENT PLACEMENT     NM MYOCAR PERF WALL MOTION  12/27/2011   normal   SPINE SURGERY      Family History  Problem Relation Age of Onset   Diabetes Mother    Hypertension Mother    Hyperlipidemia Mother    Diabetes Father    Cancer Father    Hypertension Father    Diabetes Brother    Heart disease Brother    Diabetes Sister    Colon cancer Neg Hx     Social History   Tobacco Use   Smoking status: Never   Smokeless tobacco: Never  Vaping Use   Vaping Use: Never  used  Substance Use Topics   Alcohol use: No    Alcohol/week: 4.0 standard drinks    Types: 2 Cans of beer, 2 Shots of liquor per week   Drug use: Not Currently    Frequency: 4.0 times per week    Types: "Crack" cocaine, Cocaine    Comment: Recent use of crack    Prior to Admission medications   Medication Sig Start Date End Date Taking? Authorizing Provider  albuterol (VENTOLIN HFA) 108 (90 Base) MCG/ACT inhaler Inhale 1-2 puffs into the lungs every 6 (six) hours as needed for wheezing or shortness of breath. 12/09/19  Yes Eugenie Filler, MD  bictegravir-emtricitabine-tenofovir AF (BIKTARVY) 50-200-25 MG TABS tablet Take 1 tablet by mouth daily. 04/25/21  Yes Mignon Pine, DO  budesonide-formoterol (SYMBICORT) 160-4.5 MCG/ACT inhaler Inhale 2 puffs into the lungs in the morning and at bedtime. 04/22/21  Yes [provider]  insulin glargine (LANTUS)  100 UNIT/ML Solostar Pen Inject 30 Units into the skin daily. Patient taking differently: Inject 20 Units into the skin at bedtime. 12/09/19  Yes Eugenie Filler, MD  insulin lispro (HUMALOG KWIKPEN) 100 UNIT/ML KwikPen Inject 0.04 mLs (4 Units total) into the skin 3 (three) times daily with meals. 12/09/19  Yes Eugenie Filler, MD  lidocaine (LIDODERM) 5 % Place 1 patch onto the skin daily as needed (For chestwall pain). Remove & Discard patch within 12 hours or as directed by MD 04/04/21  Yes Amponsah, Charisse March, MD  nitroGLYCERIN (NITROSTAT) 0.4 MG SL tablet Place 1 tablet (0.4 mg total) under the tongue every 5 (five) minutes as needed for chest pain. 12/09/19  Yes Eugenie Filler, MD  pantoprazole (PROTONIX) 40 MG tablet Take 40 mg by mouth daily.   Yes [provider]  traZODone (DESYREL) 50 MG tablet Take 50 mg by mouth at bedtime as needed for sleep. 06/28/21  Yes [provider]  XARELTO 15 MG TABS tablet Take 15 mg by mouth daily. 04/22/21  Yes [provider]  amLODipine (NORVASC) 10 MG tablet Take 1 tablet (10 mg total) by mouth daily. 04/05/21   Lacinda Axon, MD  buPROPion (WELLBUTRIN XL) 150 MG 24 hr tablet Take 150 mg by mouth every morning. 04/22/21   [provider]  Insulin Pen Needle 31G X 5 MM MISC 4 Units by Does not apply route in the morning, at noon, and at bedtime. 12/09/19   Eugenie Filler, MD  isosorbide mononitrate (IMDUR) 60 MG 24 hr tablet Take 2 tablets (120 mg total) by mouth daily. 04/04/21 10/01/21  Lacinda Axon, MD  mirtazapine (REMERON) 15 MG tablet Take 15 mg by mouth at bedtime as needed (sleep). 04/18/21   [provider]    Allergies Patient has no known allergies.   REVIEW OF SYSTEMS  Negative except as noted here or in the History of Present Illness.   PHYSICAL EXAMINATION  Initial Vital Signs Blood pressure 137/81, pulse 78, temperature 98.1 F (36.7 C), temperature source Oral, resp. rate  16, SpO2 99 %.  Examination General: Well-developed, well-nourished male in no acute distress; appearance consistent with age of record HENT: normocephalic; atraumatic Eyes: pupils equal, round and reactive to light; extraocular muscles intact; arcus senilis bilaterally Neck: supple Heart: Regular rate and rhythm Lungs: clear to auscultation bilaterally; frequent cough Abdomen: soft; nondistended; nontender; bowel sounds present Extremities: No deformity; full range of motion; surgical absence of right second toe and distal aspect of right first toe without  signs of infection Neurologic: Awake, alert and oriented; motor function intact in all extremities and symmetric; no facial droop Skin: Warm and dry Psychiatric: Normal mood and affect   RESULTS  Summary of this visit's results, reviewed and interpreted by myself:   EKG Interpretation  Date/Time:  Sunday August 19 2021 20:06:39 EST Ventricular Rate:  86 PR Interval:  135 QRS Duration: 92 QT Interval:  363 QTC Calculation: 435 R Axis:   54 Text Interpretation: Sinus rhythm Probable left atrial enlargement Nonspecific T abnormalities, lateral leads Minimal ST elevation, anterior leads since last tracing no significant change Confirmed by Daleen Bo [phone]) on 08/19/2021 10:54:02 PM       Laboratory Studies: Results for orders placed or performed during the hospital encounter of 08/19/21 (from the past 24 hour(s))  CBC with Differential     Status: Abnormal   Collection Time: 08/19/21  8:13 PM  Result Value Ref Range   WBC 4.2 4.0 - 10.5 K/uL   RBC 3.29 (L) 4.22 - 5.81 MIL/uL   Hemoglobin 9.3 (L) 13.0 - 17.0 g/dL   HCT 29.1 (L) 39.0 - 52.0 %   MCV 88.4 80.0 - 100.0 fL   MCH 28.3 26.0 - 34.0 pg   MCHC 32.0 30.0 - 36.0 g/dL   RDW 14.6 11.5 - 15.5 %   Platelets 181 150 - 400 K/uL   nRBC 0.0 0.0 - 0.2 %   Neutrophils Relative % 65 %   Neutro Abs 2.8 1.7 - 7.7 K/uL   Lymphocytes Relative 23 %   Lymphs Abs 1.0 0.7 -  4.0 K/uL   Monocytes Relative 8 %   Monocytes Absolute 0.3 0.1 - 1.0 K/uL   Eosinophils Relative 2 %   Eosinophils Absolute 0.1 0.0 - 0.5 K/uL   Basophils Relative 1 %   Basophils Absolute 0.0 0.0 - 0.1 K/uL   Immature Granulocytes 1 %   Abs Immature Granulocytes 0.03 0.00 - 0.07 K/uL  Comprehensive metabolic panel     Status: Abnormal   Collection Time: 08/19/21  8:13 PM  Result Value Ref Range   Sodium 136 135 - 145 mmol/L   Potassium 4.4 3.5 - 5.1 mmol/L   Chloride 105 98 - 111 mmol/L   CO2 23 22 - 32 mmol/L   Glucose, Bld 264 (H) 70 - 99 mg/dL   BUN 30 (H) 8 - 23 mg/dL   Creatinine, Ser 2.08 (H) 0.61 - 1.24 mg/dL   Calcium 8.6 (L) 8.9 - 10.3 mg/dL   Total Protein 7.4 6.5 - 8.1 g/dL   Albumin 3.5 3.5 - 5.0 g/dL   AST 19 15 - 41 U/L   ALT 17 0 - 44 U/L   Alkaline Phosphatase 77 38 - 126 U/L   Total Bilirubin 0.5 0.3 - 1.2 mg/dL   GFR, Estimated 33 (L) >60 mL/min   Anion gap 8 5 - 15  Troponin I (High Sensitivity)     Status: None   Collection Time: 08/19/21  8:13 PM  Result Value Ref Range   Troponin I (High Sensitivity) 14 <18 ng/L  Troponin I (High Sensitivity)     Status: None   Collection Time: 08/20/21  2:24 AM  Result Value Ref Range   Troponin I (High Sensitivity) 17 <18 ng/L  Resp Panel by RT-PCR (Flu A&B, Covid) Nasopharyngeal Swab     Status: None   Collection Time: 08/20/21  2:54 AM   Specimen: Nasopharyngeal Swab; Nasopharyngeal(NP) swabs in vial transport medium  Result Value Ref  Range   SARS Coronavirus 2 by RT PCR NEGATIVE NEGATIVE   Influenza A by PCR NEGATIVE NEGATIVE   Influenza B by PCR NEGATIVE NEGATIVE   Imaging Studies: No results found.  ED COURSE and MDM  Nursing notes, initial and subsequent vitals signs, including pulse oximetry, reviewed and interpreted by myself.  Vitals:   08/20/21 0023 08/20/21 0149 08/20/21 0245 08/20/21 0330  BP: (!) 146/98 137/81 (!) 160/90 (!) 146/56  Pulse: 75 78 74 77  Resp: 16 16 14  (!) 22  Temp:       TempSrc:      SpO2: 100% 99% 100% 100%   Medications - No data to display  4:11 AM The patient has been in and out of bigeminy throughout his ED visit.  His symptoms did not change when he is in the bigeminy.  It is unclear if he is ever had bigeminy in the past but he has had paroxysmal A. fib and is on Xarelto.  His troponins are normal and he is negative for coronavirus and influenza.  His chest pain is chronic and I do not believe further work-up or admission is indicated at this time.   PROCEDURES  Procedures   ED DIAGNOSES     ICD-10-CM   1. Chronic chest pain  R07.9    G89.29     2. Ventricular bigeminy seen on cardiac monitor  R00.8          Kenna Kirn, MD 08/20/21 [phone]

## 2021-08-28 ENCOUNTER — Emergency Department (HOSPITAL_COMMUNITY)
Admission: EM | Admit: 2021-08-28 | Discharge: 2021-08-29 | Disposition: A | Discharge status: Home / Self Care | Payer: Medicare Other | Attending: Emergency Medicine | Admitting: Emergency Medicine

## 2021-08-28 ENCOUNTER — Emergency Department (HOSPITAL_COMMUNITY): Payer: Medicare Other

## 2021-08-28 ENCOUNTER — Other Ambulatory Visit: Payer: Self-pay

## 2021-08-28 DIAGNOSIS — R0602 Shortness of breath: Secondary | ICD-10-CM | POA: Insufficient documentation

## 2021-08-28 DIAGNOSIS — F1411 Cocaine abuse, in remission: Secondary | ICD-10-CM

## 2021-08-28 DIAGNOSIS — R059 Cough, unspecified: Secondary | ICD-10-CM | POA: Diagnosis not present

## 2021-08-28 DIAGNOSIS — I480 Paroxysmal atrial fibrillation: Secondary | ICD-10-CM | POA: Insufficient documentation

## 2021-08-28 DIAGNOSIS — I130 Hypertensive heart and chronic kidney disease with heart failure and stage 1 through stage 4 chronic kidney disease, or unspecified chronic kidney disease: Secondary | ICD-10-CM | POA: Diagnosis not present

## 2021-08-28 DIAGNOSIS — I10 Essential (primary) hypertension: Secondary | ICD-10-CM

## 2021-08-28 DIAGNOSIS — R079 Chest pain, unspecified: Secondary | ICD-10-CM | POA: Diagnosis not present

## 2021-08-28 DIAGNOSIS — Z79899 Other long term (current) drug therapy: Secondary | ICD-10-CM | POA: Insufficient documentation

## 2021-08-28 DIAGNOSIS — R45851 Suicidal ideations: Secondary | ICD-10-CM | POA: Insufficient documentation

## 2021-08-28 DIAGNOSIS — R072 Precordial pain: Secondary | ICD-10-CM

## 2021-08-28 DIAGNOSIS — E1122 Type 2 diabetes mellitus with diabetic chronic kidney disease: Secondary | ICD-10-CM | POA: Diagnosis not present

## 2021-08-28 DIAGNOSIS — Z951 Presence of aortocoronary bypass graft: Secondary | ICD-10-CM | POA: Diagnosis not present

## 2021-08-28 DIAGNOSIS — R058 Other specified cough: Secondary | ICD-10-CM

## 2021-08-28 DIAGNOSIS — D696 Thrombocytopenia, unspecified: Secondary | ICD-10-CM

## 2021-08-28 DIAGNOSIS — Y900 Blood alcohol level of less than 20 mg/100 ml: Secondary | ICD-10-CM | POA: Insufficient documentation

## 2021-08-28 DIAGNOSIS — Z794 Long term (current) use of insulin: Secondary | ICD-10-CM | POA: Diagnosis not present

## 2021-08-28 DIAGNOSIS — N183 Chronic kidney disease, stage 3 unspecified: Secondary | ICD-10-CM | POA: Insufficient documentation

## 2021-08-28 DIAGNOSIS — J45909 Unspecified asthma, uncomplicated: Secondary | ICD-10-CM | POA: Insufficient documentation

## 2021-08-28 DIAGNOSIS — Z7951 Long term (current) use of inhaled steroids: Secondary | ICD-10-CM | POA: Insufficient documentation

## 2021-08-28 DIAGNOSIS — E1140 Type 2 diabetes mellitus with diabetic neuropathy, unspecified: Secondary | ICD-10-CM | POA: Diagnosis not present

## 2021-08-28 DIAGNOSIS — Z8616 Personal history of COVID-19: Secondary | ICD-10-CM | POA: Insufficient documentation

## 2021-08-28 DIAGNOSIS — Z20822 Contact with and (suspected) exposure to covid-19: Secondary | ICD-10-CM | POA: Diagnosis not present

## 2021-08-28 DIAGNOSIS — I2510 Atherosclerotic heart disease of native coronary artery without angina pectoris: Secondary | ICD-10-CM | POA: Insufficient documentation

## 2021-08-28 DIAGNOSIS — I509 Heart failure, unspecified: Secondary | ICD-10-CM | POA: Diagnosis not present

## 2021-08-28 DIAGNOSIS — Z7901 Long term (current) use of anticoagulants: Secondary | ICD-10-CM | POA: Insufficient documentation

## 2021-08-28 DIAGNOSIS — D649 Anemia, unspecified: Secondary | ICD-10-CM

## 2021-08-28 DIAGNOSIS — F430 Acute stress reaction: Secondary | ICD-10-CM

## 2021-08-28 LAB — CBC WITH DIFFERENTIAL/PLATELET
Abs Immature Granulocytes: 0.02 10*3/uL (ref 0.00–0.07)
Basophils Absolute: 0 10*3/uL (ref 0.0–0.1)
Basophils Relative: 1 %
Eosinophils Absolute: 0.1 10*3/uL (ref 0.0–0.5)
Eosinophils Relative: 3 %
HCT: 25.6 % — ABNORMAL LOW (ref 39.0–52.0)
Hemoglobin: 8.1 g/dL — ABNORMAL LOW (ref 13.0–17.0)
Immature Granulocytes: 1 %
Lymphocytes Relative: 26 %
Lymphs Abs: 1 10*3/uL (ref 0.7–4.0)
MCH: 28.4 pg (ref 26.0–34.0)
MCHC: 31.6 g/dL (ref 30.0–36.0)
MCV: 89.8 fL (ref 80.0–100.0)
Monocytes Absolute: 0.4 10*3/uL (ref 0.1–1.0)
Monocytes Relative: 11 %
Neutro Abs: 2.2 10*3/uL (ref 1.7–7.7)
Neutrophils Relative %: 58 %
Platelets: 148 10*3/uL — ABNORMAL LOW (ref 150–400)
RBC: 2.85 MIL/uL — ABNORMAL LOW (ref 4.22–5.81)
RDW: 14.6 % (ref 11.5–15.5)
WBC: 3.7 10*3/uL — ABNORMAL LOW (ref 4.0–10.5)
nRBC: 0 % (ref 0.0–0.2)

## 2021-08-28 LAB — BASIC METABOLIC PANEL
Anion gap: 6 (ref 5–15)
BUN: 38 mg/dL — ABNORMAL HIGH (ref 8–23)
CO2: 24 mmol/L (ref 22–32)
Calcium: 8.5 mg/dL — ABNORMAL LOW (ref 8.9–10.3)
Chloride: 108 mmol/L (ref 98–111)
Creatinine, Ser: 2.14 mg/dL — ABNORMAL HIGH (ref 0.61–1.24)
GFR, Estimated: 32 mL/min — ABNORMAL LOW (ref >60)
Glucose, Bld: 258 mg/dL — ABNORMAL HIGH (ref 70–99)
Potassium: 4.1 mmol/L (ref 3.5–5.1)
Sodium: 138 mmol/L (ref 135–145)

## 2021-08-28 LAB — RAPID URINE DRUG SCREEN, HOSP PERFORMED
Amphetamines: NOT DETECTED
Barbiturates: NOT DETECTED
Benzodiazepines: NOT DETECTED
Cocaine: NOT DETECTED
Opiates: NOT DETECTED
Tetrahydrocannabinol: NOT DETECTED

## 2021-08-28 LAB — HEPATIC FUNCTION PANEL
ALT: 27 U/L (ref 0–44)
AST: 20 U/L (ref 15–41)
Albumin: 3 g/dL — ABNORMAL LOW (ref 3.5–5.0)
Alkaline Phosphatase: 76 U/L (ref 38–126)
Bilirubin, Direct: <0.1 mg/dL (ref 0.0–0.2)
Total Bilirubin: 0.6 mg/dL (ref 0.3–1.2)
Total Protein: 6.5 g/dL (ref 6.5–8.1)

## 2021-08-28 LAB — RESP PANEL BY RT-PCR (FLU A&B, COVID) ARPGX2
Influenza A by PCR: NEGATIVE
Influenza B by PCR: NEGATIVE
SARS Coronavirus 2 by RT PCR: NEGATIVE

## 2021-08-28 LAB — TROPONIN I (HIGH SENSITIVITY)
Troponin I (High Sensitivity): 14 ng/L (ref <18)
Troponin I (High Sensitivity): 14 ng/L (ref <18)

## 2021-08-28 LAB — ETHANOL: Alcohol, Ethyl (B): <10 mg/dL (ref <10)

## 2021-08-28 NOTE — ED Notes (Signed)
Pt changing into scrubs. Security called to wand pt/

## 2021-08-28 NOTE — ED Provider Notes (Signed)
[facility] EMERGENCY DEPARTMENT Provider Note   CSN: [id number] Arrival date & time: 08/28/21  1816     History Chief Complaint  Patient presents with   Cough   Chest Pain   Shortness of Breath   Suicidal    Michael [name] is a 70 y.o. male.  Patient complains of minor cough and chest discomfort also suicidal  The history is provided by the patient and medical records. No language interpreter was used.  Cough Cough characteristics:  Dry Sputum characteristics:  Nondescript Severity:  Mild Onset quality:  Gradual Timing:  Intermittent Progression:  Resolved Chronicity:  New Smoker: no   Relieved by:  Nothing Associated symptoms: chest pain and shortness of breath   Associated symptoms: no eye discharge, no headaches and no rash   Chest Pain Associated symptoms: cough and shortness of breath   Associated symptoms: no abdominal pain, no back pain, no fatigue and no headache   Shortness of Breath Associated symptoms: chest pain and cough   Associated symptoms: no abdominal pain, no headaches and no rash       Past Medical History:  Diagnosis Date   A-fib ([name])    Asthma    No PFTs, history of childhood asthma   CAD (coronary artery disease)    a. 06/2013 STEMI/PCI ([facility]): LAD w/ thrombus (treated with BMS), mid 75%, D2 75%; LCX OM2 75%; RCA small, PDA 95%, PLV 95%;  b. 10/2013 Cath/PCI: ISR w/in LAD (Promus DES x 2), borderline OM2 lesion;  c. 01/2014 MV: Intermediate risk, medium-sized distal ant wall infarct w/ very small amt of peri-infarct ischemia. EF 60%.   Cellulitis 04/2014   left facial   Cellulitis and abscess of toe of right foot 12/08/2019   Chondromalacia of medial femoral condyle    Left knee MRI 04/28/12: Chondromalacia of the medial femoral condyle with slight peripheral degeneration of the meniscocapsular junction of the medial meniscus; followed by sports medicine   Collagen vascular disease ([place])    Crack cocaine use    for  20+ years, has been enrolled in detox programs in the past   Depression    with history of hospitalization for suicidal ideation   Diabetes mellitus 2002   Diagnosed in 2002, started insulin in 2012   Gout    Gout 04/28/2012   Headache(784.0)    CT head 08/2011: Periventricular and subcortical white matter hypodensities are most in keeping with chronic microangiopathic change   HIV infection [facility]) Nov 2012   Followed by [name]   Hyperlipidemia    Hypertension    Pulmonary embolism [facility])     Patient Active Problem List   Diagnosis Date Noted   Iron deficiency anemia 04/04/2021   Atypical chest pain 04/03/2021   Homelessness 12/13/2019   Cellulitis 12/07/2019   Osteomyelitis ([place]) 07/17/2019   Abscess or cellulitis of toe, right    Toe pain, right-second    MDD (major depressive disorder), recurrent episode, severe ([place]) 06/15/2019   Respiratory distress 04/12/2019   Pneumonia due to severe acute respiratory syndrome coronavirus 2 (SARS-CoV-2) 04/11/2019   COVID-19 virus infection 03/19/2019   Chest pain 10/03/2018   Malnutrition of moderate degree 09/21/2018   MDD (major depressive disorder), recurrent episode, moderate ([place])    Type II diabetes mellitus with renal manifestations ([place]) 05/04/2018   GERD (gastroesophageal reflux disease) 05/04/2018   S/P CABG (coronary artery bypass graft)    Left testicular pain    Depression 02/20/2018   Epididymo-orchitis, acute  02/20/2018   Essential hypertension 02/20/2018   Precordial chest pain 02/20/2018   AKI (acute kidney injury) ([place]) 02/14/2018   HCAP (healthcare-associated pneumonia) 02/14/2018   History of pulmonary embolism 07/04/2017   Acute hyponatremia 05/11/2017   Hyperglycemia due to type 2 diabetes mellitus ([place]) 05/11/2017   CHF (congestive heart failure) ([place]) 04/01/2017   Hepatitis C 12/30/2016   Chronic ischemic heart disease 11/12/2016   Paroxysmal A-fib ([place]) 11/12/2016   Back pain 04/18/2016   S/P  carotid endarterectomy 11/15/2015   MDD (major depressive disorder), recurrent severe, without psychosis ([place]) 09/09/2015   Cocaine-induced mood disorder ([place]) 08/14/2015   Cocaine abuse with cocaine-induced mood disorder ([place]) 08/14/2015   Gout 07/10/2015   Acute renal failure superimposed on stage 3 chronic kidney disease ([place]) 03/06/2015   Anemia 03/06/2015   Chronic kidney disease, stage III (moderate) ([place]) 03/06/2015   Hypoglycemia    Encounter for general adult medical examination with abnormal findings 02/09/2015   Cocaine use disorder, severe, dependence ([place]) 12/13/2014   Substance or medication-induced depressive disorder with onset during withdrawal ([place]) 12/13/2014   Severe recurrent major depressive disorder with psychotic features ([place]) 12/12/2014   Cervicalgia 06/28/2014   Lumbar radiculopathy, chronic 06/28/2014   Asthma, chronic 02/03/2014   3-vessel CAD 06/24/2013   ED (erectile dysfunction) of organic origin 07/07/2012   Hypertension goal BP (blood pressure) < 140/80 04/29/2012   Chondromalacia of left knee 03/19/2012   Hyperlipidemia with target LDL less than 100 02/12/2012   Fibromyalgia 02/12/2012   HIV (human immunodeficiency virus infection) ([place]) 08/27/2011   Uncontrolled type 2 diabetes with neuropathy 10/17/2000    Past Surgical History:  Procedure Laterality Date   AMPUTATION Right 07/21/2019   Procedure: RIGHT SECOND TOE AMPUTATION;  Surgeon: [name], MD;  Location: [facility];  Service: Orthopedics;  Laterality: Right;   BACK SURGERY     1988   BOWEL RESECTION     CARDIAC SURGERY     CERVICAL SPINE SURGERY     " rods in my neck "   CORONARY ARTERY BYPASS GRAFT     CORONARY STENT PLACEMENT     NM MYOCAR PERF WALL MOTION  12/27/2011   normal   SPINE SURGERY         Family History  Problem Relation Age of Onset   Diabetes Mother    Hypertension Mother    Hyperlipidemia Mother    Diabetes Father    Cancer Father    Hypertension Father     Diabetes Brother    Heart disease Brother    Diabetes Sister    Colon cancer Neg Hx     Social History   Tobacco Use   Smoking status: Never   Smokeless tobacco: Never  Vaping Use   Vaping Use: Never used  Substance Use Topics   Alcohol use: No    Alcohol/week: 4.0 standard drinks    Types: 2 Cans of beer, 2 Shots of liquor per week   Drug use: Not Currently    Frequency: 4.0 times per week    Types: "Crack" cocaine, Cocaine    Comment: Recent use of crack    Home Medications Prior to Admission medications   Medication Sig Start Date End Date Taking? Authorizing Provider  albuterol (VENTOLIN HFA) 108 (90 Base) MCG/ACT inhaler Inhale 1-2 puffs into the lungs every 6 (six) hours as needed for wheezing or shortness of breath. 12/09/19   Eugenie Filler, MD  amLODipine (NORVASC) 10 MG tablet Take 1  tablet (10 mg total) by mouth daily. 04/05/21   Lacinda Axon, MD  bictegravir-emtricitabine-tenofovir AF (BIKTARVY) 50-200-25 MG TABS tablet Take 1 tablet by mouth daily. 04/25/21   Mignon Pine, DO  budesonide-formoterol (SYMBICORT) 160-4.5 MCG/ACT inhaler Inhale 2 puffs into the lungs in the morning and at bedtime. 04/22/21   [provider]  buPROPion (WELLBUTRIN XL) 150 MG 24 hr tablet Take 150 mg by mouth every morning. 04/22/21   [provider]  insulin glargine (LANTUS) 100 UNIT/ML Solostar Pen Inject 30 Units into the skin daily. Patient taking differently: Inject 20 Units into the skin at bedtime. 12/09/19   Eugenie Filler, MD  insulin lispro (HUMALOG KWIKPEN) 100 UNIT/ML KwikPen Inject 0.04 mLs (4 Units total) into the skin 3 (three) times daily with meals. 12/09/19   Eugenie Filler, MD  Insulin Pen Needle 31G X 5 MM MISC 4 Units by Does not apply route in the morning, at noon, and at bedtime. 12/09/19   Eugenie Filler, MD  isosorbide mononitrate (IMDUR) 60 MG 24 hr tablet Take 2 tablets (120 mg total) by mouth daily. 04/04/21 10/01/21  Lacinda Axon, MD  lidocaine (LIDODERM) 5 % Place 1 patch onto the skin daily as needed (For chestwall pain). Remove & Discard patch within 12 hours or as directed by MD 04/04/21   Lacinda Axon, MD  mirtazapine (REMERON) 15 MG tablet Take 15 mg by mouth at bedtime as needed (sleep). 04/18/21   [provider]  nitroGLYCERIN (NITROSTAT) 0.4 MG SL tablet Place 1 tablet (0.4 mg total) under the tongue every 5 (five) minutes as needed for chest pain. 12/09/19   Eugenie Filler, MD  pantoprazole (PROTONIX) 40 MG tablet Take 40 mg by mouth daily.    [provider]  traZODone (DESYREL) 50 MG tablet Take 50 mg by mouth at bedtime as needed for sleep. 06/28/21   [provider]  XARELTO 15 MG TABS tablet Take 15 mg by mouth daily. 04/22/21   [provider]    Allergies    Patient has no known allergies.  Review of Systems   Review of Systems  Constitutional:  Negative for appetite change and fatigue.  HENT:  Negative for congestion, ear discharge and sinus pressure.   Eyes:  Negative for discharge.  Respiratory:  Positive for cough and shortness of breath.   Cardiovascular:  Positive for chest pain.  Gastrointestinal:  Negative for abdominal pain and diarrhea.  Genitourinary:  Negative for frequency and hematuria.  Musculoskeletal:  Negative for back pain.  Skin:  Negative for rash.  Neurological:  Negative for seizures and headaches.  Psychiatric/Behavioral:  Negative for hallucinations.    Physical Exam Updated Vital Signs BP (!) 152/91 (BP Location: Left Arm)   Pulse 82   Temp 98.5 F (36.9 C)   Resp 20   Ht 5\' 8"  (1.727 m)   Wt 70.8 kg   SpO2 100%   BMI 23.72 kg/m   Physical Exam Vitals and nursing note reviewed.  Constitutional:      Appearance: He is well-developed.  HENT:     Head: Normocephalic.     Mouth/Throat:     Mouth: Mucous membranes are moist.  Eyes:     General: No scleral icterus.    Conjunctiva/sclera: Conjunctivae  normal.  Neck:     Thyroid: No thyromegaly.  Cardiovascular:     Rate and Rhythm: Normal rate and regular rhythm.     Heart sounds: No  murmur heard.   No friction rub. No gallop.  Pulmonary:     Breath sounds: No stridor. No wheezing or rales.  Chest:     Chest wall: No tenderness.  Abdominal:     General: There is no distension.     Tenderness: There is no abdominal tenderness. There is no rebound.  Musculoskeletal:        General: Normal range of motion.     Cervical back: Neck supple.  Lymphadenopathy:     Cervical: No cervical adenopathy.  Skin:    Findings: No erythema or rash.  Neurological:     Mental Status: He is alert and oriented to person, place, and time.     Motor: No abnormal muscle tone.     Coordination: Coordination normal.  Psychiatric:     Comments: Patient is suicidal    ED Results / Procedures / Treatments   Labs (all labs ordered are listed, but only abnormal results are displayed) Labs Reviewed  BASIC METABOLIC PANEL - Abnormal; Notable for the following components:      Result Value   Glucose, Bld 258 (*)    BUN 38 (*)    Creatinine, Ser 2.14 (*)    Calcium 8.5 (*)    GFR, Estimated 32 (*)    All other components within normal limits  CBC WITH DIFFERENTIAL/PLATELET - Abnormal; Notable for the following components:   WBC 3.7 (*)    RBC 2.85 (*)    Hemoglobin 8.1 (*)    HCT 25.6 (*)    Platelets 148 (*)    All other components within normal limits  HEPATIC FUNCTION PANEL - Abnormal; Notable for the following components:   Albumin 3.0 (*)    All other components within normal limits  RESP PANEL BY RT-PCR (FLU A&B, COVID) ARPGX2  ETHANOL  RAPID URINE DRUG SCREEN, HOSP PERFORMED  TROPONIN I (HIGH SENSITIVITY)  TROPONIN I (HIGH SENSITIVITY)    EKG EKG Interpretation  Date/Time:  Tuesday August 28 2021 18:25:58 EST Ventricular Rate:  86 PR Interval:  136 QRS Duration: 84 QT Interval:  358 QTC Calculation: 428 R Axis:   56 Text  Interpretation: Normal sinus rhythm Minimal voltage criteria for LVH, may be normal variant ( Sokolow-Lyon ) Borderline ECG Confirmed by Milton Ferguson [phone]) on 08/28/2021 8:42:36 PM  Radiology DG Chest 2 View  Result Date: 08/28/2021 CLINICAL DATA:  Intermittent chest pain for 3 weeks, short of breath EXAM: CHEST - 2 VIEW COMPARISON:  08/14/2021 FINDINGS: Frontal and lateral views of the chest demonstrates stable postsurgical changes from CABG. The cardiac silhouette is unremarkable. No acute airspace disease, effusion, or pneumothorax. No acute bony abnormalities. IMPRESSION: 1. No acute intrathoracic process. Electronically Signed   By: Randa Ngo M.D.   On: 08/28/2021 19:25    Procedures Procedures   Medications Ordered in ED Medications - No data to display  ED Course  I have reviewed the triage vital signs and the nursing notes.  Pertinent labs & imaging results that were available during my care of the patient were reviewed by me and considered in my medical decision making (see chart for details). Patient is medically cleared.  He has a mild cough that he can follow-up with medical doctor for.  He does state he is suicidal and that will be evaluated by behavioral health   MDM Rules/Calculators/A&P  Suicidal ideations and mild cough Final Clinical Impression(s) / ED Diagnoses Final diagnoses:  None    Rx / DC Orders ED Discharge Orders     None        Milton Ferguson, MD 08/28/21 2046

## 2021-08-28 NOTE — BH Assessment (Signed)
Clinician messaged Chloe Y. Ronny Flurry, RN: "Hey. It's Trey with TTS. Is the pt able to be assessed. Also is the pt under IVC?"   Clinician awaiting response.    Vertell Novak, [place], [facility], [facility] Triage Specialist [phone]

## 2021-08-28 NOTE — ED Notes (Signed)
Patient verbalized some suicidal thoughts while getting his blood drawn.

## 2021-08-28 NOTE — ED Provider Notes (Addendum)
Emergency Medicine Provider Triage Evaluation Note  Michael [name] , a 70 y.o. male  was evaluated in triage.  Pt complains of chest pain.  He states that he has had intermittent, central chest pain that radiates from the left to the right side of his chest for 3 weeks.  He endorses intermittent palpitations and shortness of breath over the past 3 weeks as well.  Additionally he has had cough which he says predates his chest pain.  Endorses is tactile fever.  Denies lower extremity swelling.  He does have a history of triple bypass.  EMS reports that he was in bigeminy of PVCs.  Review of Systems  Positive: See above Negative:   Physical Exam  Ht 5\' 8"  (1.727 m)   Wt 70.8 kg   BMI 23.72 kg/m  Gen:   Awake, no distress   Resp:  Normal effort, transmitted upper airway sounds MSK:   Moves extremities without difficulty, no lower extremity edema Other:  S1/S2 without murmur.  Abdomen is soft.  Medical Decision Making  Medically screening exam initiated at 6:24 PM.  Appropriate orders placed.  [name] was informed that the remainder of the evaluation will be completed by another provider, this initial triage assessment does not replace that evaluation, and the importance of remaining in the ED until their evaluation is complete.  1837: After exam, 1 phlebotomy was in the room patient stated that he felt suicidal.  I went and spoke with the patient.  He states that he has had intermittent thoughts of self-harm.  He has no plan.  He states "what ever happens, happens."  He also endorses intermittent homicidal ideations.  He cannot say toward who or what he would do.  He also states he has had visual hallucinations of a man walking across the room about 2 weeks ago.  The patient does not appear psychotic, or responding to internal stimuli.   Mickie Hillier, PA-C 08/28/21 1825    Mickie Hillier, PA-C 08/28/21 Junious Dresser    Milton Ferguson, MD 08/28/21 2101

## 2021-08-28 NOTE — ED Notes (Signed)
Pt was wanded by security. Pt's belongings inventoried, locked in locker #3. Suitcase and sleeping bag placed on the floor w pt's name label on it.

## 2021-08-28 NOTE — ED Triage Notes (Signed)
C/O chest pain on and off x 3 weeks; worst palpation and movement w/ cough, unproductive. C/O shortness of breathe w/ activity. Endorsed hx CABG; EMS ekg NSR w/ bigeminy

## 2021-08-28 NOTE — Progress Notes (Addendum)
Inpatient [place] Placement  Pt meets inpatient criteria per Michael John, PA-C.There are no appropriate beds at [facility].  Pt recommended for gero psych per Michael John, PA-C. CSW sent a secure chat to [facility] physician and clinical system to be reviewed: Dr. Weber Cooks, MD, Ian Malkin, Counselor, and [facility], LCAS. First shift CSW added to secure chat to follow-up.   Referral was sent to the following facilities:  @11 :08pm CSW faxed referral to the following facilities to include CCA.  Destination Service Provider Address Phone Fax  [facility]  [address] [place] [address] [phone]  [place]  [address] [place] [address] [phone] [facility]  [address] [place] [address] [phone] [place]  [place], [place] [address] [phone]  [facility]  [address], [place] [address] [phone] [place]  [address], [place] [address] [phone]  [place], [place] [address] [phone] [facility]  [address], [place] [address] [phone]  [facility]  [address] [place] [address] [phone] [facility]  [address] [place] [address] [phone]  [facility]  [address] [place] [address] [phone] [facility]  [place], [place] [address] [place]  [name]  [address] [place] [address] [phone]  [facility]  [address] [place] [address] [phone]    Situation ongoing,  CSW will follow up.   Benjaman Kindler, MSW, LCSWA 08/28/2021  @ 10:14 PM

## 2021-08-28 NOTE — ED Notes (Signed)
Pt reported to phlebotomist that he is feeling suicidal. PA notified, speaking with pt.

## 2021-08-28 NOTE — BH Assessment (Signed)
Comprehensive Clinical Assessment (CCA) Note  08/28/2021 Michael [name] [id number]  Disposition: Margorie John, PA-C recommends inpatient geropsychiatry. Disposition CSW to seek placement. Disposition discussed with Dr. Denton Meek and Chloe. Tacy Dura, RN via secure chat.   [place] ED from 08/28/2021 in [place] ED from 08/19/2021 in [place] DEPT ED from 08/14/2021 in [facility] CATEGORY High Risk Error: Q3, 4, or 5 should not be populated when Q2 is No Moderate Risk      The patient demonstrates the following risk factors for suicide: Chronic risk factors for suicide include: psychiatric disorder of Major Depressive Disorder, substance use disorder, previous suicide attempts Pt reports, a year ago he attempted suicide by cutting his wrist, and previous self-harm Pt reports, he has not cut himself since his suicide attempt . Acute risk factors for suicide include: social withdrawal/isolation and Homelessness, suicidal ideations . Protective factors for this patient include:  None . Considering these factors, the overall suicide risk at this point appears to be high. Patient is appropriate for outpatient follow up.  Michael [name] is a 70 year old male who presents voluntary and unaccompanied to [place]. Clinician asked the pt, "what brought you to the hospital?" Pt reports, heart problems, pain on left side of chest, trouble breathing, suicidal ideations. Pt reports, what ever happens is fine, as his suicide plan. Pt also reports, he feels like he wants to hurt somebody, no one in particular. Pt reports, he had a triple bypass, a year ago but was still having left side pain. Pt reports, he was a [facility] for three months due to left side pain, trouble breathing, suicidal ideations; he was discharged less than a week ago. Pt reports, after discharge he moved  from [place] to [place] because he needs to start doing things by himself. Pt reports, when he was in [place] he was at the hospital every week because of heart problems. Pt reports, while in [place] he was living in a rooming house but is currently living on the streets in [place]. Pt reports, he has two toes (his big toe and the next toe) on his right foot amputated and left knee pain. Pt reports, twice within the year he seen a tall man dressed in black with a 10 gallon hat, walking across the room not saying anything. Pt reports, every 2-3 months he'll be sitting around, he'll forget everything, start hollering and when he comes to, he'll be on the floor. Pt reports, four months ago he can be walking, blank out and will be found at night in traffic, cars blowing their horns. Pt denies, AVH, current self injurious behaviors and access to weapons.   Pt reports, he has been sober from [place] for two months ago. Per chart pt's UDS is positive for 08/15/2021. Pt denies, being linked to OPT resources (medication management and/or counseling.) Pt reports, his PCP is Dr. Quay Burow in [place]. Pt reports, previous inpatient admissions.   Pt presents wrapped in blanket coughing at times with normal speech. Pt's mood, affect was depressed. Pt's insight, judgement was fair. Pt reports, if discharged he could hurt himself or someone else. Clinician discussed the three possible dispositions (discharged with OPT resources, observe/reassess by psychiatry or inpatient treatment) in detail.   Diagnosis: Major Depressive Disorder.  *Pt denies, having supports. Pt reports, he has no one. Pt reports, he does not want to be a burden to his children.*  Chief Complaint:  Chief Complaint  Patient presents with   Cough   Chest Pain   Shortness of Breath   Suicidal   Visit Diagnosis:     CCA Screening, Triage and Referral (STR)  Patient Reported Information How did you hear about [place]?  Self  What Is the Reason for Your Visit/Call Today? Per EDP note: "Patient complains of minor cough and chest discomfort also suicidal."  How Long Has This Been Causing You Problems? > than 6 months  What Do You Feel Would Help You the Most Today? Treatment for Depression or other mood problem; Alcohol or Drug Use Treatment   Have You Recently Had Any Thoughts About Hurting Yourself? Yes  Are You Planning to Commit Suicide/Harm Yourself At This time? No   Have you Recently Had Thoughts About [place]? Yes (Pt reports, he feels like he would hurt somebody, nobody in particular.)  Are You Planning to Harm Someone at This Time? No  Explanation: No data recorded  Have You Used Any Alcohol or Drugs in the Past 24 Hours? No (Pt denies, however pt's UDS was positive for Cocaine on 08/15/2021.)  How Long Ago Did You Use Drugs or Alcohol? No data recorded What Did You Use and How Much? No data recorded  Do You Currently Have a Therapist/Psychiatrist? No  Name of Therapist/Psychiatrist: No data recorded  Have You Been Recently Discharged From Any Office Practice or Programs? No  Explanation of Discharge From Practice/Program: No data recorded    CCA Screening Triage Referral Assessment Type of Contact: Tele-Assessment  Telemedicine Service Delivery: Telemedicine service delivery: This service was provided via telemedicine using a 2-way, interactive audio and video technology  Is this Initial or Reassessment? Initial Assessment  Date Telepsych consult ordered in [facility]:  08/28/21  Time Telepsych consult ordered in [facility]:  2044  Location of Assessment: [facility] ED  Provider [facility]   Collateral Involvement: Pt denies, having supports. Pt reports, he has no one.   Does Patient Have a Stage manager Guardian? No data recorded Name and Contact of Legal Guardian: No data recorded If Minor and Not Living with Parent(s), Who has Custody? No  data recorded Is CPS involved or ever been involved? Never  Is APS involved or ever been involved? Never   Patient Determined To Be At Risk for Harm To Self or Others Based on Review of Patient Reported Information or Presenting Complaint? Yes, for Self-Harm  Method: No data recorded Availability of Means: No data recorded Intent: No data recorded Notification Required: No data recorded Additional Information for Danger to Others Potential: No data recorded Additional Comments for Danger to Others Potential: No data recorded Are There Guns or Other Weapons in Your Home? No data recorded Types of Guns/Weapons: No data recorded Are These Weapons Safely Secured?                            No data recorded Who Could Verify You Are Able To Have These Secured: No data recorded Do You Have any Outstanding Charges, Pending Court Dates, Parole/Probation? No data recorded Contacted To Inform of Risk of Harm To Self or Others: No data recorded   Does Patient Present under Involuntary Commitment? No  IVC Papers Initial File Date: No data recorded  [place] of Residence: [place]   Patient Currently Receiving the Following Services: Not Receiving Services   Determination of Need: Emergent (2 hours)   Options  For Referral: Inpatient Hospitalization; Medication Management; Outpatient Therapy; Facility-Based Crisis     CCA Biopsychosocial Patient Reported Schizophrenia/Schizoaffective Diagnosis in Past: No   Strengths: No data recorded  Mental Health Symptoms Depression:   Change in energy/activity; Hopelessness; Increase/decrease in appetite; Irritability; Sleep (too much or little); Tearfulness; Worthlessness; Fatigue; Difficulty Concentrating; Weight gain/loss (Isolation, guilt.)   Duration of Depressive symptoms:    Mania:   None   Anxiety:    Worrying; Tension; Irritability; Fatigue; Difficulty concentrating   Psychosis:   None   Duration of Psychotic symptoms:     Trauma:   None   Obsessions:   None   Compulsions:   None   Inattention:   Disorganized; Forgetful; Loses things   Hyperactivity/Impulsivity:   Feeling of restlessness   Oppositional/Defiant Behaviors:   Angry; Argumentative   Emotional Irregularity:   None; Recurrent suicidal behaviors/gestures/threats   Other Mood/Personality Symptoms:  No data recorded   Mental Status Exam Appearance and self-care  Stature:   Average   Weight:   Average weight   Clothing:   -- (Pt wrapped in blanket.)   Grooming:   Normal   Cosmetic use:   None   Posture/gait:   Normal   Motor activity:   Not Remarkable   Sensorium  Attention:   Normal   Concentration:   Normal   Orientation:   X5   Recall/memory:   Normal   Affect and Mood  Affect:   Depressed   Mood:   Depressed   Relating  Eye contact:   Normal   Facial expression:   Depressed   Attitude toward examiner:   Cooperative   Thought and Language  Speech flow:  Normal   Thought content:   Appropriate to Mood and Circumstances   Preoccupation:   None   Hallucinations:   Visual   Organization:  No data recorded  [employer] of Knowledge:   Fair   Intelligence:   Average   Abstraction:   Normal   Judgement:   Fair   Art therapist:   Adequate   Insight:   Fair   Decision Making:   Impulsive   Social Functioning  Social Maturity:   Isolates   Social Judgement:   "Street Smart"   Stress  Stressors:   Housing; Illness; Family conflict   Coping Ability:   Programme researcher, broadcasting/film/video Deficits:   Decision making   Supports:   Support needed     Religion: Religion/Spirituality Are You A Religious Person?: Yes What is Your Religious Affiliation?: Methodist  Leisure/Recreation: Leisure / Recreation Do You Have Hobbies?: No  Exercise/Diet: Exercise/Diet Do You Exercise?: No Have You Gained or Lost A Significant Amount of Weight in the Past Six  Months?: Yes-Lost Number of Pounds Lost?: 19 (Pt reports, loosing 19 pounds within the last year.) Do You Follow a Special Diet?: No Do You Have Any Trouble Sleeping?: Yes Explanation of Sleeping Difficulties: Pt reports, 2-3 hours per night. Pt reports, he's homeless and sleeps on the streets.   CCA Employment/Education Employment/Work Situation: Employment / Work Situation Employment Situation: On disability Why is Patient on Disability: Pt reports, he got injured on the job. How Long has Patient Been on Disability: Pt reports, he was injured on July 07, 1984. Patient's Job has Been Impacted by Current Illness: No Has Patient ever Been in the [employer]?: No  Education: Education Is Patient Currently Attending School?: No Last Grade Completed: 12 Did You Attend College?: No  CCA Family/Childhood History Family and Relationship History: Family history Marital status: Widowed Widowed, when?: Pt reports, his wife died two years ago. Does patient have children?: Yes How many children?: 2 How is patient's relationship with their children?: Pt reports, he does not want to be a burden to his children.  Childhood History:  Childhood History By whom was/is the patient raised?: Both parents (Per chart.) Did patient suffer any verbal/emotional/physical/sexual abuse as a child?: No Did patient suffer from severe childhood neglect?: No Has patient ever been sexually abused/assaulted/raped as an adolescent or adult?: No Witnessed domestic violence?: No Has patient been affected by domestic violence as an adult?:  (NA)  Child/Adolescent Assessment:     CCA Substance Use Alcohol/Drug Use: Alcohol / Drug Use Pain Medications: See MAR Prescriptions: See MAR Over the Counter: See MAR Longest period of sobriety (when/how long): Per pt, two months. Negative Consequences of Use: Personal relationships, Financial Withdrawal Symptoms: None Substance #1 Name of Substance 1:  Cocaine. 1 - Last Use / Amount: Per pt, two months ago. Per chart pt's UDS is positive for 08/15/2021.     ASAM's:  Six Dimensions of Multidimensional Assessment  Dimension 1:  Acute Intoxication and/or Withdrawal Potential:      Dimension 2:  Biomedical Conditions and Complications:      Dimension 3:  Emotional, Behavioral, or Cognitive Conditions and Complications:     Dimension 4:  Readiness to Change:     Dimension 5:  Relapse, Continued use, or Continued Problem Potential:     Dimension 6:  Recovery/Living Environment:     ASAM Severity Score:    ASAM Recommended Level of Treatment:     Substance use Disorder (SUD)    Recommendations for Services/Supports/Treatments: Recommendations for Services/Supports/Treatments Recommendations For Services/Supports/Treatments: Inpatient Hospitalization  Discharge Disposition:    DSM5 Diagnoses: Patient Active Problem List   Diagnosis Date Noted   Iron deficiency anemia 04/04/2021   Atypical chest pain 04/03/2021   Homelessness 12/13/2019   Cellulitis 12/07/2019   Osteomyelitis ([place]) 07/17/2019   Abscess or cellulitis of toe, right    Toe pain, right-second    MDD (major depressive disorder), recurrent episode, severe ([place]) 06/15/2019   Respiratory distress 04/12/2019   Pneumonia due to severe acute respiratory syndrome coronavirus 2 (SARS-CoV-2) 04/11/2019   COVID-19 virus infection 03/19/2019   Chest pain 10/03/2018   Malnutrition of moderate degree 09/21/2018   MDD (major depressive disorder), recurrent episode, moderate ([name])    Type II diabetes mellitus with renal manifestations ([place]) 05/04/2018   GERD (gastroesophageal reflux disease) 05/04/2018   S/P CABG (coronary artery bypass graft)    Left testicular pain    Depression 02/20/2018   Epididymo-orchitis, acute 02/20/2018   Essential hypertension 02/20/2018   Precordial chest pain 02/20/2018   AKI (acute kidney injury) ([place]) 02/14/2018   HCAP (healthcare-associated  pneumonia) 02/14/2018   History of pulmonary embolism 07/04/2017   Acute hyponatremia 05/11/2017   Hyperglycemia due to type 2 diabetes mellitus ([place]) 05/11/2017   CHF (congestive heart failure) ([place]) 04/01/2017   Hepatitis C 12/30/2016   Chronic ischemic heart disease 11/12/2016   Paroxysmal A-fib ([place]) 11/12/2016   Back pain 04/18/2016   S/P carotid endarterectomy 11/15/2015   MDD (major depressive disorder), recurrent severe, without psychosis ([place]) 09/09/2015   Cocaine-induced mood disorder ([place]) 08/14/2015   Cocaine abuse with cocaine-induced mood disorder ([place]) 08/14/2015   Gout 07/10/2015   Acute renal failure superimposed on stage 3 chronic kidney disease ([place]) 03/06/2015   Anemia 03/06/2015  Chronic kidney disease, stage III (moderate) ([place]) 03/06/2015   Hypoglycemia    Encounter for general adult medical examination with abnormal findings 02/09/2015   Cocaine use disorder, severe, dependence ([place]) 12/13/2014   Substance or medication-induced depressive disorder with onset during withdrawal ([place]) 12/13/2014   Severe recurrent major depressive disorder with psychotic features ([place]) 12/12/2014   Cervicalgia 06/28/2014   Lumbar radiculopathy, chronic 06/28/2014   Asthma, chronic 02/03/2014   3-vessel CAD 06/24/2013   ED (erectile dysfunction) of organic origin 07/07/2012   Hypertension goal BP (blood pressure) < 140/80 04/29/2012   Chondromalacia of left knee 03/19/2012   Hyperlipidemia with target LDL less than 100 02/12/2012   Fibromyalgia 02/12/2012   HIV (human immunodeficiency virus infection) ([place]) 08/27/2011   Uncontrolled type 2 diabetes with neuropathy 10/17/2000     Referrals to Alternative Service(s): Referred to Alternative Service(s):   Place:   Date:   Time:    Referred to Alternative Service(s):   Place:   Date:   Time:    Referred to Alternative Service(s):   Place:   Date:   Time:    Referred to Alternative Service(s):   Place:   Date:   Time:      Vertell Novak, [facility] Comprehensive Clinical Assessment (CCA) Screening, Triage and Referral Note  08/28/2021 Michael [name] [id number]  Chief Complaint:  Chief Complaint  Patient presents with   Cough   Chest Pain   Shortness of Breath   Suicidal   Visit Diagnosis:   Patient Reported Information How did you hear about [place]? Self  What Is the Reason for Your Visit/Call Today? Per EDP note: "Patient complains of minor cough and chest discomfort also suicidal."  How Long Has This Been Causing You Problems? > than 6 months  What Do You Feel Would Help You the Most Today? Treatment for Depression or other mood problem; Alcohol or Drug Use Treatment   Have You Recently Had Any Thoughts About Hurting Yourself? Yes  Are You Planning to Commit Suicide/Harm Yourself At This time? No   Have you Recently Had Thoughts About [place]? Yes (Pt reports, he feels like he would hurt somebody, nobody in particular.)  Are You Planning to Harm Someone at This Time? No  Explanation: No data recorded  Have You Used Any Alcohol or Drugs in the Past 24 Hours? No (Pt denies, however pt's UDS was positive for Cocaine on 08/15/2021.)  How Long Ago Did You Use Drugs or Alcohol? No data recorded What Did You Use and How Much? No data recorded  Do You Currently Have a Therapist/Psychiatrist? No  Name of Therapist/Psychiatrist: No data recorded  Have You Been Recently Discharged From Any Office Practice or Programs? No  Explanation of Discharge From Practice/Program: No data recorded   CCA Screening Triage Referral Assessment Type of Contact: Tele-Assessment  Telemedicine Service Delivery: Telemedicine service delivery: This service was provided via telemedicine using a 2-way, interactive audio and video technology  Is this Initial or Reassessment? Initial Assessment  Date Telepsych consult ordered in [facility]:  08/28/21  Time Telepsych consult ordered in [facility]:   2044  Location of Assessment: [facility] ED  Provider Location: [facility]   Collateral Involvement: Pt denies, having supports. Pt reports, he has no one.   Does Patient Have a Stage manager Guardian? No data recorded Name and Contact of Legal Guardian: No data recorded If Minor and Not Living with Parent(s), Who has Custody? No data recorded Is CPS involved  or ever been involved? Never  Is APS involved or ever been involved? Never   Patient Determined To Be At Risk for Harm To Self or Others Based on Review of Patient Reported Information or Presenting Complaint? Yes, for Self-Harm  Method: No data recorded Availability of Means: No data recorded Intent: No data recorded Notification Required: No data recorded Additional Information for Danger to Others Potential: No data recorded Additional Comments for Danger to Others Potential: No data recorded Are There Guns or Other Weapons in Your Home? No data recorded Types of Guns/Weapons: No data recorded Are These Weapons Safely Secured?                            No data recorded Who Could Verify You Are Able To Have These Secured: No data recorded Do You Have any Outstanding Charges, Pending Court Dates, Parole/Probation? No data recorded Contacted To Inform of Risk of Harm To Self or Others: No data recorded  Does Patient Present under Involuntary Commitment? No  IVC Papers Initial File Date: No data recorded  [place] of Residence: [place]   Patient Currently Receiving the Following Services: Not Receiving Services   Determination of Need: Emergent (2 hours)   Options For Referral: Inpatient Hospitalization; Medication Management; Outpatient Therapy; Facility-Based Crisis   Discharge Disposition:     Vertell Novak, [place], [place], [facility], [facility] Triage Specialist [phone]

## 2021-08-29 ENCOUNTER — Encounter (HOSPITAL_COMMUNITY): Payer: Self-pay

## 2021-08-29 NOTE — Social Work (Signed)
CSW met with Pt at bedside for consult for homelessness issues.  Pt states that he has been staying in motels in [place] but that he came to [place] 3 or 4 days ago. Pt did not elaborate as to why he came. Pt states that he receives his mail at the [facility]. CSW encouraged Pt to utilize [facility] but Pt declined stating that people are using crack at the housing authority and that he's heard "lots of bad things" about housing authority. CSW offered an alternative to housing authority and added resource to AVS. CSW and Pt also added Pt to [facility] shelter list and CSW added [name] number to AVS and explained instructions for Pt to follow up. CSW offered SUD resources but Pt declined stating that he had already stopped using cocaine. CSW provided bus pass for d/c transportation needs.

## 2021-08-29 NOTE — ED Notes (Signed)
Pt was given all of his belongings & he is getting dressed now. His AVS with all resources explained to him are attached.

## 2021-08-29 NOTE — ED Notes (Signed)
Break order placed

## 2021-08-29 NOTE — ED Provider Notes (Signed)
Emergency Medicine Observation Re-evaluation Note  Constance Hackenberg is a 70 y.o. male, seen on rounds today.  Pt initially presented to the ED for complaints of recent uri symptoms, and also notes feelings of stress/depression related to social situation/homelessness.   Physical Exam  BP (!) 174/91   Pulse 68   Temp 98.5 F (36.9 C)   Resp 11   Ht 1.727 m (5\' 8" )   Wt 70.8 kg   SpO2 100%   BMI 23.72 kg/m  Physical Exam General: alert, content, nad. Cardiac: regular rate Lungs: breathing comfortably Psych: normal mood and affect, conversant. Pt does not appear acutely depressed or despondent. Patient is not responding to internal stimuli, no delusions or hallucinations noted. No thoughts/plan of harming self or others.   ED Course / MDM   I have reviewed the labs performed to date as well as medications administered while in observation.  Recent changes in the last 24 hours include ED obs, reassessment.   Plan    [name] is not under involuntary commitment.  On review of prior charts, pt with long history chronic intermittent SI - in past, appears often related to homelessness/housing issues. Notes in Care Everywhere from 02/2021 summarize prior issues.   On re-exam today, pt without any acute psychosis, and is not responding to internal stimuli. He does not appear acutely depressed, but does acknowledge social stressors.  Pt states had been living in hotel in [name] area, and is new to [place] as he requested transport here as he had heard there were shelters/resources available in [place].  Pt indicates he does have source of income/disability. Will ask TOC to eval and provide resources, and will also provide patient pcp and bh resources in area for close outpt follow up.        Lajean Saver, MD 08/29/21 0900

## 2021-08-29 NOTE — Discharge Instructions (Addendum)
Because you don't want to go to the [facility], I found this: [employer]  [phone] We added your name to the [facility] List Please Call them to check your status: [phone]  It was our pleasure to provide your ER care today - we hope that you feel better.  See resource guide provided in terms of helping you access social services and substance use programs.   For mental health issues and/or crisis, you may go directly to the [facility] - it is open 24/7 and walk-ins are welcome.   For medical issues, follow up with primary care doctor in the next 1-2 weeks - also have your blood pressure rechecked then as it is high today.  Return to ER if worse, new symptoms, fevers, trouble breathing, or other emergency concern.

## 2021-09-08 ENCOUNTER — Emergency Department (HOSPITAL_COMMUNITY)
Admission: EM | Admit: 2021-09-08 | Discharge: 2021-09-10 | Disposition: A | Discharge status: Home / Self Care | Payer: Medicare Other | Attending: Emergency Medicine | Admitting: Emergency Medicine

## 2021-09-08 ENCOUNTER — Other Ambulatory Visit: Payer: Self-pay

## 2021-09-08 DIAGNOSIS — I509 Heart failure, unspecified: Secondary | ICD-10-CM | POA: Diagnosis not present

## 2021-09-08 DIAGNOSIS — E1140 Type 2 diabetes mellitus with diabetic neuropathy, unspecified: Secondary | ICD-10-CM | POA: Insufficient documentation

## 2021-09-08 DIAGNOSIS — I4891 Unspecified atrial fibrillation: Secondary | ICD-10-CM | POA: Insufficient documentation

## 2021-09-08 DIAGNOSIS — I130 Hypertensive heart and chronic kidney disease with heart failure and stage 1 through stage 4 chronic kidney disease, or unspecified chronic kidney disease: Secondary | ICD-10-CM | POA: Diagnosis not present

## 2021-09-08 DIAGNOSIS — R45851 Suicidal ideations: Secondary | ICD-10-CM

## 2021-09-08 DIAGNOSIS — Z7951 Long term (current) use of inhaled steroids: Secondary | ICD-10-CM | POA: Insufficient documentation

## 2021-09-08 DIAGNOSIS — Z79899 Other long term (current) drug therapy: Secondary | ICD-10-CM | POA: Diagnosis not present

## 2021-09-08 DIAGNOSIS — I2510 Atherosclerotic heart disease of native coronary artery without angina pectoris: Secondary | ICD-10-CM | POA: Diagnosis not present

## 2021-09-08 DIAGNOSIS — N183 Chronic kidney disease, stage 3 unspecified: Secondary | ICD-10-CM | POA: Diagnosis not present

## 2021-09-08 DIAGNOSIS — F1420 Cocaine dependence, uncomplicated: Secondary | ICD-10-CM | POA: Diagnosis present

## 2021-09-08 DIAGNOSIS — G8929 Other chronic pain: Secondary | ICD-10-CM

## 2021-09-08 DIAGNOSIS — F1494 Cocaine use, unspecified with cocaine-induced mood disorder: Secondary | ICD-10-CM | POA: Diagnosis not present

## 2021-09-08 DIAGNOSIS — F1410 Cocaine abuse, uncomplicated: Secondary | ICD-10-CM

## 2021-09-08 DIAGNOSIS — Z20822 Contact with and (suspected) exposure to covid-19: Secondary | ICD-10-CM | POA: Insufficient documentation

## 2021-09-08 DIAGNOSIS — Z8616 Personal history of COVID-19: Secondary | ICD-10-CM | POA: Insufficient documentation

## 2021-09-08 DIAGNOSIS — Z7982 Long term (current) use of aspirin: Secondary | ICD-10-CM | POA: Diagnosis not present

## 2021-09-08 DIAGNOSIS — Z7901 Long term (current) use of anticoagulants: Secondary | ICD-10-CM | POA: Insufficient documentation

## 2021-09-08 DIAGNOSIS — Z951 Presence of aortocoronary bypass graft: Secondary | ICD-10-CM | POA: Diagnosis not present

## 2021-09-08 DIAGNOSIS — J45909 Unspecified asthma, uncomplicated: Secondary | ICD-10-CM | POA: Diagnosis not present

## 2021-09-08 DIAGNOSIS — E1122 Type 2 diabetes mellitus with diabetic chronic kidney disease: Secondary | ICD-10-CM | POA: Insufficient documentation

## 2021-09-08 DIAGNOSIS — Z794 Long term (current) use of insulin: Secondary | ICD-10-CM | POA: Insufficient documentation

## 2021-09-08 DIAGNOSIS — F1424 Cocaine dependence with cocaine-induced mood disorder: Secondary | ICD-10-CM | POA: Diagnosis present

## 2021-09-08 DIAGNOSIS — Z21 Asymptomatic human immunodeficiency virus [HIV] infection status: Secondary | ICD-10-CM | POA: Insufficient documentation

## 2021-09-08 DIAGNOSIS — R079 Chest pain, unspecified: Secondary | ICD-10-CM | POA: Insufficient documentation

## 2021-09-08 NOTE — ED Triage Notes (Signed)
Pt report SI for a month. Per report pt found laying in a bus stop. Pt denies N/V/D. Pt a/ox4.  BP 166 102  HR 92 RR 18 O2sat 98% on RA CBG 253

## 2021-09-09 ENCOUNTER — Other Ambulatory Visit (HOSPITAL_COMMUNITY): Payer: Self-pay

## 2021-09-09 ENCOUNTER — Emergency Department (HOSPITAL_COMMUNITY): Payer: Medicare Other

## 2021-09-09 DIAGNOSIS — F1494 Cocaine use, unspecified with cocaine-induced mood disorder: Secondary | ICD-10-CM | POA: Diagnosis not present

## 2021-09-09 DIAGNOSIS — R45851 Suicidal ideations: Secondary | ICD-10-CM | POA: Diagnosis not present

## 2021-09-09 LAB — URINALYSIS, ROUTINE W REFLEX MICROSCOPIC
Bilirubin Urine: NEGATIVE
Glucose, UA: 150 mg/dL — AB
Ketones, ur: NEGATIVE mg/dL
Leukocytes,Ua: NEGATIVE
Nitrite: NEGATIVE
Protein, ur: >=300 mg/dL — AB
Specific Gravity, Urine: 1.019 (ref 1.005–1.030)
pH: 5 (ref 5.0–8.0)

## 2021-09-09 LAB — CBC
HCT: 34.8 % — ABNORMAL LOW (ref 39.0–52.0)
Hemoglobin: 11.1 g/dL — ABNORMAL LOW (ref 13.0–17.0)
MCH: 28.5 pg (ref 26.0–34.0)
MCHC: 31.9 g/dL (ref 30.0–36.0)
MCV: 89.2 fL (ref 80.0–100.0)
Platelets: 218 10*3/uL (ref 150–400)
RBC: 3.9 MIL/uL — ABNORMAL LOW (ref 4.22–5.81)
RDW: 14.4 % (ref 11.5–15.5)
WBC: 5.7 10*3/uL (ref 4.0–10.5)
nRBC: 0 % (ref 0.0–0.2)

## 2021-09-09 LAB — COMPREHENSIVE METABOLIC PANEL
ALT: 20 U/L (ref 0–44)
AST: 35 U/L (ref 15–41)
Albumin: 4.3 g/dL (ref 3.5–5.0)
Alkaline Phosphatase: 103 U/L (ref 38–126)
Anion gap: 8 (ref 5–15)
BUN: 37 mg/dL — ABNORMAL HIGH (ref 8–23)
CO2: 22 mmol/L (ref 22–32)
Calcium: 9 mg/dL (ref 8.9–10.3)
Chloride: 106 mmol/L (ref 98–111)
Creatinine, Ser: 2.24 mg/dL — ABNORMAL HIGH (ref 0.61–1.24)
GFR, Estimated: 31 mL/min — ABNORMAL LOW (ref >60)
Glucose, Bld: 207 mg/dL — ABNORMAL HIGH (ref 70–99)
Potassium: 4.5 mmol/L (ref 3.5–5.1)
Sodium: 136 mmol/L (ref 135–145)
Total Bilirubin: 0.9 mg/dL (ref 0.3–1.2)
Total Protein: 8.1 g/dL (ref 6.5–8.1)

## 2021-09-09 LAB — CBG MONITORING, ED
Glucose-Capillary: 162 mg/dL — ABNORMAL HIGH (ref 70–99)
Glucose-Capillary: 105 mg/dL — ABNORMAL HIGH (ref 70–99)
Glucose-Capillary: 200 mg/dL — ABNORMAL HIGH (ref 70–99)
Glucose-Capillary: 135 mg/dL — ABNORMAL HIGH (ref 70–99)

## 2021-09-09 LAB — RESP PANEL BY RT-PCR (FLU A&B, COVID) ARPGX2
Influenza A by PCR: NEGATIVE
Influenza B by PCR: NEGATIVE
SARS Coronavirus 2 by RT PCR: NEGATIVE

## 2021-09-09 LAB — ACETAMINOPHEN LEVEL: Acetaminophen (Tylenol), Serum: <10 ug/mL — ABNORMAL LOW (ref 10–30)

## 2021-09-09 LAB — RAPID URINE DRUG SCREEN, HOSP PERFORMED
Amphetamines: NOT DETECTED
Barbiturates: NOT DETECTED
Benzodiazepines: NOT DETECTED
Cocaine: POSITIVE — AB
Opiates: NOT DETECTED
Tetrahydrocannabinol: NOT DETECTED

## 2021-09-09 LAB — TROPONIN I (HIGH SENSITIVITY)
Troponin I (High Sensitivity): 21 ng/L — ABNORMAL HIGH (ref <18)
Troponin I (High Sensitivity): 18 ng/L — ABNORMAL HIGH (ref <18)

## 2021-09-09 LAB — ETHANOL: Alcohol, Ethyl (B): <10 mg/dL (ref <10)

## 2021-09-09 LAB — SALICYLATE LEVEL: Salicylate Lvl: <7 mg/dL — ABNORMAL LOW (ref 7.0–30.0)

## 2021-09-09 MED ORDER — TRAZODONE HCL 100 MG PO TABS: 50.0000 mg | ORAL_TABLET | Freq: Every evening | ORAL | Status: DC | PRN

## 2021-09-09 MED ORDER — ASPIRIN 81 MG PO CHEW
324.0000 mg | CHEWABLE_TABLET | Freq: Once | ORAL | Status: AC
  Administered 2021-09-09: 02:00:00 324 mg via ORAL
  Filled 2021-09-09: qty 4

## 2021-09-09 MED ORDER — INSULIN ASPART 100 UNIT/ML IJ SOLN
0.0000 [IU] | Freq: Three times a day (TID) | INTRAMUSCULAR | Status: DC
  Administered 2021-09-09 (×2): 2 [IU] via SUBCUTANEOUS
  Administered 2021-09-10: 5 [IU] via SUBCUTANEOUS
  Filled 2021-09-09: qty 0.09

## 2021-09-09 MED ORDER — MOMETASONE FURO-FORMOTEROL FUM 200-5 MCG/ACT IN AERO
2.0000 | INHALATION_SPRAY | Freq: Two times a day (BID) | RESPIRATORY_TRACT | Status: DC
  Administered 2021-09-10: 2 via RESPIRATORY_TRACT
  Filled 2021-09-09: qty 8.8

## 2021-09-09 MED ORDER — LORAZEPAM 1 MG PO TABS
1.0000 mg | ORAL_TABLET | Freq: Once | ORAL | Status: AC
  Administered 2021-09-09: 03:00:00 1 mg via ORAL
  Filled 2021-09-09: qty 1

## 2021-09-09 MED ORDER — INSULIN GLARGINE-YFGN 100 UNIT/ML ~~LOC~~ SOLN: 20.0000 [IU] | Freq: Every day | SUBCUTANEOUS | Status: DC

## 2021-09-09 MED ORDER — AMLODIPINE BESYLATE 5 MG PO TABS
10.0000 mg | ORAL_TABLET | Freq: Every day | ORAL | Status: DC
  Administered 2021-09-09 – 2021-09-10 (×2): 10 mg via ORAL
  Filled 2021-09-09 (×2): qty 2

## 2021-09-09 MED ORDER — RIVAROXABAN 15 MG PO TABS: 15.0000 mg | ORAL_TABLET | Freq: Every day | ORAL | Status: DC

## 2021-09-09 MED ORDER — ISOSORBIDE MONONITRATE ER 60 MG PO TB24
120.0000 mg | ORAL_TABLET | Freq: Every day | ORAL | Status: DC
  Administered 2021-09-10: 120 mg via ORAL
  Filled 2021-09-09: qty 2

## 2021-09-09 MED ORDER — BICTEGRAVIR-EMTRICITAB-TENOFOV 50-200-25 MG PO TABS
1.0000 | ORAL_TABLET | Freq: Every day | ORAL | Status: DC
  Administered 2021-09-10: 1 via ORAL
  Filled 2021-09-09: qty 1

## 2021-09-09 MED ORDER — BUPROPION HCL ER (XL) 150 MG PO TB24
150.0000 mg | ORAL_TABLET | Freq: Every morning | ORAL | Status: DC
  Administered 2021-09-09 – 2021-09-10 (×2): 150 mg via ORAL
  Filled 2021-09-09 (×2): qty 1

## 2021-09-09 MED ORDER — TIZANIDINE HCL 4 MG PO TABS
4.0000 mg | ORAL_TABLET | Freq: Once | ORAL | Status: AC | PRN
  Administered 2021-09-09: 20:00:00 4 mg via ORAL
  Filled 2021-09-09: qty 1

## 2021-09-09 MED ORDER — ACETAMINOPHEN 325 MG PO TABS
650.0000 mg | ORAL_TABLET | Freq: Once | ORAL | Status: AC
  Administered 2021-09-09: 20:00:00 650 mg via ORAL
  Filled 2021-09-09: qty 2

## 2021-09-09 MED ORDER — ALBUTEROL SULFATE HFA 108 (90 BASE) MCG/ACT IN AERS: 1.0000 | INHALATION_SPRAY | Freq: Four times a day (QID) | RESPIRATORY_TRACT | Status: DC | PRN

## 2021-09-09 NOTE — Consult Note (Signed)
Telepsych Consultation   Reason for Consult:  suicidal Referring Physician:  [name], MD Location of Patient: [facility] Location of Provider: [place] Department  Patient Identification: Michael [name] MRN:  [id number] Principal Diagnosis: Cocaine-induced mood disorder ([place]) Diagnosis:  Principal Problem:   Cocaine-induced mood disorder ([place]) Active Problems:   Cocaine use disorder, severe, dependence ([place])   Total Time spent with patient: 30 minutes  Subjective:   Michael [name] is a 70 y.o. male patient admitted with suicidal ideations.  4431: pt too somnolent for assessment  1157:  "Chest pain. Still hurts". Patient laying in recliner somnolent; no eye contact, minimal response. Unable to participate in assessment. RN notified.   HPI:  Michael [name] is a 70 year old male patient with past history of cocaine abuse severe, HIV, chest pain, DM, acute kidney injury who presented to [facility] voluntarily for SI over past month. Upon arrival patient began complaining of chest pain; troponin obtained, 21. UDS+cocaine.   Past Psychiatric History: cocaine use disorder severe, cocaine induced mood disorder, Afib, MDD recurrent severe without psychosis  Risk to Self:   Risk to Others:   Prior Inpatient Therapy:   Prior Outpatient Therapy:    Past Medical History:  Past Medical History:  Diagnosis Date   A-fib ([place])    Asthma    No PFTs, history of childhood asthma   CAD (coronary artery disease)    a. 06/2013 STEMI/PCI ([facility]): LAD w/ thrombus (treated with BMS), mid 75%, D2 75%; LCX OM2 75%; RCA small, PDA 95%, PLV 95%;  b. 10/2013 Cath/PCI: ISR w/in LAD (Promus DES x 2), borderline OM2 lesion;  c. 01/2014 MV: Intermediate risk, medium-sized distal ant wall infarct w/ very small amt of peri-infarct ischemia. EF 60%.   Cellulitis 04/2014   left facial   Cellulitis and abscess of toe of right foot 12/08/2019   Chondromalacia of medial femoral condyle     Left knee MRI 04/28/12: Chondromalacia of the medial femoral condyle with slight peripheral degeneration of the meniscocapsular junction of the medial meniscus; followed by sports medicine   Collagen vascular disease ([place])    Crack cocaine use    for 20+ years, has been enrolled in detox programs in the past   Depression    with history of hospitalization for suicidal ideation   Diabetes mellitus 2002   Diagnosed in 2002, started insulin in 2012   Gout    Gout 04/28/2012   Headache(784.0)    CT head 08/2011: Periventricular and subcortical white matter hypodensities are most in keeping with chronic microangiopathic change   HIV infection [facility]) Nov 2012   Followed by [name]   Hyperlipidemia    Hypertension    Pulmonary embolism [facility])     Past Surgical History:  Procedure Laterality Date   AMPUTATION Right 07/21/2019   Procedure: RIGHT SECOND TOE AMPUTATION;  Surgeon: [name], MD;  Location: [place];  Service: Orthopedics;  Laterality: Right;   BACK SURGERY     1988   BOWEL RESECTION     CARDIAC SURGERY     CERVICAL SPINE SURGERY     " rods in my neck "   CORONARY ARTERY BYPASS GRAFT     CORONARY STENT PLACEMENT     NM MYOCAR PERF WALL MOTION  12/27/2011   normal   SPINE SURGERY     Family History:  Family History  Problem Relation Age of Onset   Diabetes Mother    Hypertension Mother  Hyperlipidemia Mother    Diabetes Father    Cancer Father    Hypertension Father    Diabetes Brother    Heart disease Brother    Diabetes Sister    Colon cancer Neg Hx    Family Psychiatric  History: not noted Social History:  Social History   Substance and Sexual Activity  Alcohol Use No   Alcohol/week: 4.0 standard drinks   Types: 2 Cans of beer, 2 Shots of liquor per week     Social History   Substance and Sexual Activity  Drug Use Not Currently   Frequency: 4.0 times per week   Types: "Crack" cocaine, Cocaine   Comment: Recent use of crack    Social History    Socioeconomic History   Marital status: Widowed    Spouse name: Not on file   Number of children: 2   Years of education: 76   Highest education level: 12th grade  Occupational History    Employer: UNEMPLOYED    Comment: 04/2016  Tobacco Use   Smoking status: Never   Smokeless tobacco: Never  Vaping Use   Vaping Use: Never used  Substance and Sexual Activity   Alcohol use: No    Alcohol/week: 4.0 standard drinks    Types: 2 Cans of beer, 2 Shots of liquor per week   Drug use: Not Currently    Frequency: 4.0 times per week    Types: "Crack" cocaine, Cocaine    Comment: Recent use of crack   Sexual activity: Yes    Comment: DECLINED CONDOMS  Other Topics Concern   Not on file  Social History Narrative   Currently staying with a friend in [place].  Was staying @ local motel until a few days ago - left b/c of bed bugs.   P[facility].  Not followed by a psychiatrist.   Social Determinants of Health   Financial Resource Strain: Not on file  Food Insecurity: Not on file  Transportation Needs: Not on file  Physical Activity: Not on file  Stress: Not on file  Social Connections: Not on file   Additional Social History:    Allergies:  No Known Allergies  Labs:  Results for orders placed or performed during the hospital encounter of 09/08/21 (from the past 48 hour(s))  Comprehensive metabolic panel     Status: Abnormal   Collection Time: 09/09/21 12:54 AM  Result Value Ref Range   Sodium 136 135 - 145 mmol/L   Potassium 4.5 3.5 - 5.1 mmol/L   Chloride 106 98 - 111 mmol/L   CO2 22 22 - 32 mmol/L   Glucose, Bld 207 (H) 70 - 99 mg/dL    Comment: Glucose reference range applies only to samples taken after fasting for at least 8 hours.   BUN 37 (H) 8 - 23 mg/dL   Creatinine, Ser 2.24 (H) 0.61 - 1.24 mg/dL   Calcium 9.0 8.9 - 10.3 mg/dL   Total Protein 8.1 6.5 - 8.1 g/dL   Albumin 4.3 3.5 - 5.0 g/dL   AST 35 15 - 41 U/L   ALT 20 0 - 44 U/L   Alkaline  Phosphatase 103 38 - 126 U/L   Total Bilirubin 0.9 0.3 - 1.2 mg/dL   GFR, Estimated 31 (L) >60 mL/min    Comment: (NOTE) Calculated using the CKD-EPI Creatinine Equation (2021)    Anion gap 8 5 - 15    Comment: Performed at [facility], [place] [address]  [address] [place], [place] [address]  Ethanol     Status: None   Collection Time: 09/09/21 12:54 AM  Result Value Ref Range   Alcohol, Ethyl (B) <10 <10 mg/dL    Comment: (NOTE) Lowest detectable limit for serum alcohol is 10 mg/dL.  For medical purposes only. Performed at [facility], [place] [address], [place], [place] [address]   Salicylate level     Status: Abnormal   Collection Time: 09/09/21 12:54 AM  Result Value Ref Range   Salicylate Lvl <0.0 (L) 7.0 - 30.0 mg/dL    Comment: Performed at [facility], [place] [address], [place], [place] [address]  Acetaminophen level     Status: Abnormal   Collection Time: 09/09/21 12:54 AM  Result Value Ref Range   Acetaminophen (Tylenol), Serum <10 (L) 10 - 30 ug/mL    Comment: (NOTE) Therapeutic concentrations vary significantly. A range of 10-30 ug/mL  may be an effective concentration for many patients. However, some  are best treated at concentrations outside of this range. Acetaminophen concentrations >150 ug/mL at 4 hours after ingestion  and >50 ug/mL at 12 hours after ingestion are often associated with  toxic reactions.  Performed at [facility], [place] [address], [place], [place] [address]   cbc     Status: Abnormal   Collection Time: 09/09/21 12:54 AM  Result Value Ref Range   WBC 5.7 4.0 - 10.5 K/uL   RBC 3.90 (L) 4.22 - 5.81 MIL/uL   Hemoglobin 11.1 (L) 13.0 - 17.0 g/dL   HCT 34.8 (L) 39.0 - 52.0 %   MCV 89.2 80.0 - 100.0 fL   MCH 28.5 26.0 - 34.0 pg   MCHC 31.9 30.0 - 36.0 g/dL   RDW 14.4 11.5 - 15.5 %   Platelets 218 150 - 400 K/uL   nRBC 0.0 0.0 - 0.2 %    Comment: Performed at [facility], [place] [address], [place], [place] [address]  Resp Panel by RT-PCR (Flu A&B, Covid) Nasopharyngeal Swab     Status: None   Collection Time: 09/09/21 12:54 AM   Specimen: Nasopharyngeal Swab; Nasopharyngeal(NP) swabs in vial transport medium  Result Value Ref Range   SARS Coronavirus 2 by RT PCR NEGATIVE NEGATIVE    Comment: (NOTE) SARS-CoV-2 target nucleic acids are NOT DETECTED.  The SARS-CoV-2 RNA is generally detectable in upper respiratory specimens during the acute phase of infection. The lowest concentration of SARS-CoV-2 viral copies this assay can detect is 138 copies/mL. A negative result does not preclude SARS-Cov-2 infection and should not be used as the sole basis for treatment or other patient management decisions. A negative result may occur with  improper specimen collection/handling, submission of specimen other than nasopharyngeal swab, presence of viral mutation(s) within the areas targeted by this assay, and inadequate number of viral copies(<138 copies/mL). A negative result must be combined with clinical observations, patient history, and epidemiological information. The expected result is Negative.  Fact Sheet for Patients:  EntrepreneurPulse.com.au  Fact Sheet for Healthcare Providers:  IncredibleEmployment.be  This test is no t yet approved or cleared by the [place] FDA and  has been authorized for detection and/or diagnosis of SARS-CoV-2 by FDA under an Emergency Use Authorization (EUA). This EUA will remain  in effect (meaning this test can be used) for the duration of the COVID-19 declaration under Section 564(b)(1) of the Act, 21 U.S.C.section 360bbb-3(b)(1), unless the authorization is terminated  or revoked sooner.  Influenza A by PCR NEGATIVE NEGATIVE   Influenza B by PCR NEGATIVE NEGATIVE    Comment: (NOTE) The Xpert Xpress SARS-CoV-2/FLU/RSV plus assay is intended as an aid in  the diagnosis of influenza from Nasopharyngeal swab specimens and should not be used as a sole basis for treatment. Nasal washings and aspirates are unacceptable for Xpert Xpress SARS-CoV-2/FLU/RSV testing.  Fact Sheet for Patients: EntrepreneurPulse.com.au  Fact Sheet for Healthcare Providers: IncredibleEmployment.be  This test is not yet approved or cleared by the [place] FDA and has been authorized for detection and/or diagnosis of SARS-CoV-2 by FDA under an Emergency Use Authorization (EUA). This EUA will remain in effect (meaning this test can be used) for the duration of the COVID-19 declaration under Section 564(b)(1) of the Act, 21 U.S.C. section 360bbb-3(b)(1), unless the authorization is terminated or revoked.  Performed at [facility], [place] [address] [place], [place] [address]   Troponin I (High Sensitivity)     Status: Abnormal   Collection Time: 09/09/21 12:54 AM  Result Value Ref Range   Troponin I (High Sensitivity) 21 (H) <18 ng/L    Comment: (NOTE) Elevated high sensitivity troponin I (hsTnI) values and significant  changes across serial measurements may suggest ACS but many other  chronic and acute conditions are known to elevate hsTnI results.  Refer to the "Links" section for chest pain algorithms and additional  guidance. Performed at [facility], [place] [address], [place], [place] [address]   Rapid urine drug screen (hospital performed)     Status: Abnormal   Collection Time: 09/09/21  3:04 AM  Result Value Ref Range   Opiates NONE DETECTED NONE DETECTED   Cocaine POSITIVE (A) NONE DETECTED   Benzodiazepines NONE DETECTED NONE DETECTED   Amphetamines NONE DETECTED NONE DETECTED   Tetrahydrocannabinol NONE DETECTED NONE DETECTED   Barbiturates NONE DETECTED NONE DETECTED    Comment: (NOTE) DRUG SCREEN FOR MEDICAL PURPOSES ONLY.  IF CONFIRMATION IS NEEDED FOR ANY PURPOSE,  NOTIFY LAB WITHIN 5 DAYS.  LOWEST DETECTABLE LIMITS FOR URINE DRUG SCREEN Drug Class                     Cutoff (ng/mL) Amphetamine and metabolites    1000 Barbiturate and metabolites    200 Benzodiazepine                 299 Tricyclics and metabolites     300 Opiates and metabolites        300 Cocaine and metabolites        300 THC                            50 Performed at [facility], [place] [address] [place], [place] [address]   Urinalysis, Routine w reflex microscopic     Status: Abnormal   Collection Time: 09/09/21  3:04 AM  Result Value Ref Range   Color, Urine YELLOW YELLOW   APPearance HAZY (A) CLEAR   Specific Gravity, Urine 1.019 1.005 - 1.030   pH 5.0 5.0 - 8.0   Glucose, UA 150 (A) NEGATIVE mg/dL   Hgb urine dipstick MODERATE (A) NEGATIVE   Bilirubin Urine NEGATIVE NEGATIVE   Ketones, ur NEGATIVE NEGATIVE mg/dL   Protein, ur >=300 (A) NEGATIVE mg/dL   Nitrite NEGATIVE NEGATIVE   Leukocytes,Ua NEGATIVE NEGATIVE    Comment: Performed at [place] [address] [place],  [place] [address]  Troponin I (High Sensitivity)     Status: Abnormal   Collection Time: 09/09/21  3:12 AM  Result Value Ref Range   Troponin I (High Sensitivity) 18 (H) <18 ng/L    Comment: (NOTE) Elevated high sensitivity troponin I (hsTnI) values and significant  changes across serial measurements may suggest ACS but many other  chronic and acute conditions are known to elevate hsTnI results.  Refer to the "Links" section for chest pain algorithms and additional  guidance. Performed at [facility], [place] [address], [place], [place] [address]   CBG monitoring, ED     Status: Abnormal   Collection Time: 09/09/21  8:23 AM  Result Value Ref Range   Glucose-Capillary 162 (H) 70 - 99 mg/dL    Comment: Glucose reference range applies only to samples taken after fasting for at least 8 hours.  CBG monitoring, ED     Status: Abnormal    Collection Time: 09/09/21 12:18 PM  Result Value Ref Range   Glucose-Capillary 105 (H) 70 - 99 mg/dL    Comment: Glucose reference range applies only to samples taken after fasting for at least 8 hours.  CBG monitoring, ED     Status: Abnormal   Collection Time: 09/09/21  5:12 PM  Result Value Ref Range   Glucose-Capillary 200 (H) 70 - 99 mg/dL    Comment: Glucose reference range applies only to samples taken after fasting for at least 8 hours.    Medications:  Current Facility-Administered Medications  Medication Dose Route Frequency Provider Last Rate Last Admin   albuterol (VENTOLIN HFA) 108 (90 Base) MCG/ACT inhaler 1-2 puff  1-2 puff Inhalation Q6H PRN Rancour, Annie Main, MD       amLODipine (NORVASC) tablet 10 mg  10 mg Oral Daily Rancour, Stephen, MD   10 mg at 09/09/21 1118   bictegravir-emtricitabine-tenofovir AF (BIKTARVY) 50-200-25 MG per tablet 1 tablet  1 tablet Oral Daily Rancour, Stephen, MD       buPROPion (WELLBUTRIN XL) 24 hr tablet 150 mg  150 mg Oral q morning Rancour, Stephen, MD   150 mg at 09/09/21 1118   insulin aspart (novoLOG) injection 0-9 Units  0-9 Units Subcutaneous TID WC Rancour, Annie Main, MD   2 Units at 09/09/21 1728   insulin glargine (LANTUS) Solostar Pen 20 Units  20 Units Subcutaneous QHS Rancour, Stephen, MD       isosorbide mononitrate (IMDUR) 24 hr tablet 120 mg  120 mg Oral Daily Rancour, Stephen, MD       mometasone-formoterol (DULERA) 200-5 MCG/ACT inhaler 2 puff  2 puff Inhalation BID Rancour, Annie Main, MD       Rivaroxaban Alveda Reasons) tablet 15 mg  15 mg Oral Daily Rancour, Stephen, MD       traZODone (DESYREL) tablet 50 mg  50 mg Oral QHS PRN Rancour, Stephen, MD       Current Outpatient Medications  Medication Sig Dispense Refill   albuterol (VENTOLIN HFA) 108 (90 Base) MCG/ACT inhaler Inhale 1-2 puffs into the lungs every 6 (six) hours as needed for wheezing or shortness of breath. 8 g 1   amLODipine (NORVASC) 10 MG tablet Take 1 tablet (10 mg  total) by mouth daily. 60 tablet 2   aspirin EC 81 MG tablet Take 81 mg by mouth daily. Swallow whole.     bictegravir-emtricitabine-tenofovir AF (BIKTARVY) 50-200-25 MG TABS tablet Take 1 tablet by mouth daily. 30 tablet 11   budesonide-formoterol (SYMBICORT) 160-4.5 MCG/ACT inhaler Inhale 2 puffs  into the lungs in the morning and at bedtime.     buPROPion (WELLBUTRIN XL) 150 MG 24 hr tablet Take 150 mg by mouth every morning.     diclofenac Sodium (VOLTAREN) 1 % GEL Apply 2 g topically 2 (two) times daily as needed (for pain in shoulders, neck, and knees).     insulin glargine (LANTUS) 100 UNIT/ML Solostar Pen Inject 30 Units into the skin daily. (Patient taking differently: Inject 20 Units into the skin at bedtime.) 15 mL 1   insulin lispro (HUMALOG KWIKPEN) 100 UNIT/ML KwikPen Inject 0.04 mLs (4 Units total) into the skin 3 (three) times daily with meals. 15 mL 0   Insulin Pen Needle 31G X 5 MM MISC 4 Units by Does not apply route in the morning, at noon, and at bedtime. 100 each 0   isosorbide mononitrate (IMDUR) 60 MG 24 hr tablet Take 2 tablets (120 mg total) by mouth daily. 60 tablet 2   lidocaine (LIDODERM) 5 % Place 1 patch onto the skin daily as needed (For chestwall pain). Remove & Discard patch within 12 hours or as directed by MD 30 patch 0   mirtazapine (REMERON) 15 MG tablet Take 15 mg by mouth at bedtime as needed (sleep).     nitroGLYCERIN (NITROSTAT) 0.4 MG SL tablet Place 1 tablet (0.4 mg total) under the tongue every 5 (five) minutes as needed for chest pain. 10 tablet 0   pantoprazole (PROTONIX) 40 MG tablet Take 40 mg by mouth daily as needed (for heartburn).     traZODone (DESYREL) 50 MG tablet Take 50 mg by mouth at bedtime as needed for sleep.     XARELTO 15 MG TABS tablet Take 15 mg by mouth daily.      Musculoskeletal: Strength & Muscle Tone: decreased Gait & Station: normal Patient leans: N/A  Psychiatric Specialty Exam:  Presentation  General Appearance:  Casual  Eye Contact:None  Speech:Slow; Other (comment) (minimal)  Speech Volume:Decreased  Handedness:Right   Mood and Affect  Mood:-- (withdrawn)  Affect:Restricted   Thought Process  Thought Processes:Other (comment) (unable to fully assess due to lack of patient participation)  Descriptions of Associations:-- (unable to fully assess due to lack of patient participation)  Orientation:Other (comment) (unable to fully assess due to lack of patient participation)  Thought Content:Other (comment) (unable to fully assess due to lack of patient participation)  History of Schizophrenia/Schizoaffective disorder:No  Duration of Psychotic Symptoms:No data recorded Hallucinations:Hallucinations: None Ideas of Reference:None  Suicidal Thoughts:Suicidal Thoughts: -- (unable to fully assess due to lack of patient participation) Homicidal Thoughts:Homicidal Thoughts: No (unable to fully assess due to lack of patient participation)  Sensorium  Memory:Other (comment) (unable to fully assess due to lack of patient participation)  Judgment:Fair  Insight:Other (comment) (unable to fully assess due to lack of patient participation)   Solicitor (comment) (unable to fully assess due to lack of patient participation)  Attention Span:Other (comment) (unable to fully assess due to lack of patient participation)  Recall:Other (comment) (unable to fully assess due to lack of patient participation)  [place] (comment) (unable to fully assess due to lack of patient participation)  Language:Other (comment) (unable to fully assess due to lack of patient participation)   Psychomotor Activity  Psychomotor Activity:Psychomotor Activity: Decreased  Assets  Assets:Resilience   Sleep  Sleep:Sleep: Good   Physical Exam: Physical Exam Vitals and nursing note reviewed.  Constitutional:      Appearance: Normal appearance. He is normal  weight.  HENT:     Head: Normocephalic.     Nose: Nose normal.     Mouth/Throat:     Mouth: Mucous membranes are moist.     Pharynx: Oropharynx is clear.  Eyes:     Comments: Unable to assess; pt not opening eyes  Neurological:     Comments: Unable to assess; pt not fully participating  Psychiatric:        Attention and Perception: He is inattentive.        Speech: He is noncommunicative.        Behavior: Behavior is uncooperative and withdrawn.        Judgment: Judgment is inappropriate.     Comments: unable to fully assess due to lack of patient participation   Review of Systems  All other systems reviewed and are negative. Blood pressure (!) 173/92, pulse 82, temperature 97.9 F (36.6 C), temperature source Oral, resp. rate 16, height 5\' 8"  (1.727 m), weight 70.8 kg, SpO2 99 %. Body mass index is 23.72 kg/m.  Treatment Plan Summary: Daily contact with patient to assess and evaluate symptoms and progress in treatment and Plan continuously monitor patient progress while in ED; reassess by psychiatry in the am.   Disposition:  Continuous observation, patient continues to complain of chest pain; reassess in the morning.   This service was provided via telemedicine using a 2-way, interactive audio and video technology.  Names of all persons participating in this telemedicine service and their role in this encounter. Name: Oneida Alar Role: PMHNP  Name: Hampton Abbot Role: Attending MD  Name: Michael [name] Role: patient  Name:  Role:     Inda Merlin, NP 09/09/2021 7:26 PM

## 2021-09-09 NOTE — ED Notes (Signed)
Unable to obtain 2nd troponin.

## 2021-09-09 NOTE — BH Assessment (Signed)
Attempted TTS tele-assessment but Pt is somnolent and would not open his eyes or respond to any questions. Assessment will be completed when Pt is able to participate.   Evelena Peat, [facility], [facility] Triage Specialist [phone]

## 2021-09-09 NOTE — ED Provider Notes (Signed)
[facility] DEPT Provider Note   CSN: [id number] Arrival date & time: 09/08/21  2346     History Chief Complaint  Patient presents with   Suicidal    Michael [name] is a 70 y.o. male.  Patient with a history of CAD s/p stents and CABG, chronic crack use, diabetes, pulmonary embolism on Xarelto, diabetes, HIV presenting with chest pain.  He states he has had left-sided chest pain for the past "1 year" after his CABG procedure.  States the pain is constant and never goes away.  It radiates to his neck.  No associated shortness of breath, nausea, vomiting or diaphoresis.  No abdominal pain.  Denies any cough or fever. Also reports ongoing suicidal ideation for the past several weeks.  Does not have a definite plan.  He admits to alcohol and crack use.  Denies hallucinations.  Denies hearing any voices.  The history is provided by the patient.      Past Medical History:  Diagnosis Date   A-fib ([place])    Asthma    No PFTs, history of childhood asthma   CAD (coronary artery disease)    a. 06/2013 STEMI/PCI ([facility]): LAD w/ thrombus (treated with BMS), mid 75%, D2 75%; LCX OM2 75%; RCA small, PDA 95%, PLV 95%;  b. 10/2013 Cath/PCI: ISR w/in LAD (Promus DES x 2), borderline OM2 lesion;  c. 01/2014 MV: Intermediate risk, medium-sized distal ant wall infarct w/ very small amt of peri-infarct ischemia. EF 60%.   Cellulitis 04/2014   left facial   Cellulitis and abscess of toe of right foot 12/08/2019   Chondromalacia of medial femoral condyle    Left knee MRI 04/28/12: Chondromalacia of the medial femoral condyle with slight peripheral degeneration of the meniscocapsular junction of the medial meniscus; followed by sports medicine   Collagen vascular disease ([place])    Crack cocaine use    for 20+ years, has been enrolled in detox programs in the past   Depression    with history of hospitalization for suicidal ideation   Diabetes mellitus 2002   Diagnosed in  2002, started insulin in 2012   Gout    Gout 04/28/2012   Headache(784.0)    CT head 08/2011: Periventricular and subcortical white matter hypodensities are most in keeping with chronic microangiopathic change   HIV infection [facility]) Nov 2012   Followed by [name]   Hyperlipidemia    Hypertension    Pulmonary embolism [facility])     Patient Active Problem List   Diagnosis Date Noted   Iron deficiency anemia 04/04/2021   Atypical chest pain 04/03/2021   Homelessness 12/13/2019   Cellulitis 12/07/2019   Osteomyelitis ([place]) 07/17/2019   Abscess or cellulitis of toe, right    Toe pain, right-second    MDD (major depressive disorder), recurrent episode, severe ([place]) 06/15/2019   Respiratory distress 04/12/2019   Pneumonia due to severe acute respiratory syndrome coronavirus 2 (SARS-CoV-2) 04/11/2019   COVID-19 virus infection 03/19/2019   Chest pain 10/03/2018   Malnutrition of moderate degree 09/21/2018   MDD (major depressive disorder), recurrent episode, moderate ([place])    Type II diabetes mellitus with renal manifestations ([place]) 05/04/2018   GERD (gastroesophageal reflux disease) 05/04/2018   S/P CABG (coronary artery bypass graft)    Left testicular pain    Depression 02/20/2018   Epididymo-orchitis, acute 02/20/2018   Essential hypertension 02/20/2018   Precordial chest pain 02/20/2018   AKI (acute kidney injury) ([place]) 02/14/2018   HCAP (healthcare-associated  pneumonia) 02/14/2018   History of pulmonary embolism 07/04/2017   Acute hyponatremia 05/11/2017   Hyperglycemia due to type 2 diabetes mellitus ([place]) 05/11/2017   CHF (congestive heart failure) ([place]) 04/01/2017   Hepatitis C 12/30/2016   Chronic ischemic heart disease 11/12/2016   Paroxysmal A-fib ([place]) 11/12/2016   Back pain 04/18/2016   S/P carotid endarterectomy 11/15/2015   MDD (major depressive disorder), recurrent severe, without psychosis ([place]) 09/09/2015   Cocaine-induced mood disorder ([place]) 08/14/2015    Cocaine abuse with cocaine-induced mood disorder ([place]) 08/14/2015   Gout 07/10/2015   Acute renal failure superimposed on stage 3 chronic kidney disease ([place]) 03/06/2015   Anemia 03/06/2015   Chronic kidney disease, stage III (moderate) ([place]) 03/06/2015   Hypoglycemia    Encounter for general adult medical examination with abnormal findings 02/09/2015   Cocaine use disorder, severe, dependence ([place]) 12/13/2014   Substance or medication-induced depressive disorder with onset during withdrawal ([place]) 12/13/2014   Severe recurrent major depressive disorder with psychotic features ([place]) 12/12/2014   Cervicalgia 06/28/2014   Lumbar radiculopathy, chronic 06/28/2014   Asthma, chronic 02/03/2014   3-vessel CAD 06/24/2013   ED (erectile dysfunction) of organic origin 07/07/2012   Hypertension goal BP (blood pressure) < 140/80 04/29/2012   Chondromalacia of left knee 03/19/2012   Hyperlipidemia with target LDL less than 100 02/12/2012   Fibromyalgia 02/12/2012   HIV (human immunodeficiency virus infection) ([place]) 08/27/2011   Uncontrolled type 2 diabetes with neuropathy 10/17/2000    Past Surgical History:  Procedure Laterality Date   AMPUTATION Right 07/21/2019   Procedure: RIGHT SECOND TOE AMPUTATION;  Surgeon: [name], MD;  Location: [facility];  Service: Orthopedics;  Laterality: Right;   BACK SURGERY     1988   BOWEL RESECTION     CARDIAC SURGERY     CERVICAL SPINE SURGERY     " rods in my neck "   CORONARY ARTERY BYPASS GRAFT     CORONARY STENT PLACEMENT     NM MYOCAR PERF WALL MOTION  12/27/2011   normal   SPINE SURGERY         Family History  Problem Relation Age of Onset   Diabetes Mother    Hypertension Mother    Hyperlipidemia Mother    Diabetes Father    Cancer Father    Hypertension Father    Diabetes Brother    Heart disease Brother    Diabetes Sister    Colon cancer Neg Hx     Social History   Tobacco Use   Smoking status: Never   Smokeless tobacco:  Never  Vaping Use   Vaping Use: Never used  Substance Use Topics   Alcohol use: No    Alcohol/week: 4.0 standard drinks    Types: 2 Cans of beer, 2 Shots of liquor per week   Drug use: Not Currently    Frequency: 4.0 times per week    Types: "Crack" cocaine, Cocaine    Comment: Recent use of crack    Home Medications Prior to Admission medications   Medication Sig Start Date End Date Taking? Authorizing Provider  albuterol (VENTOLIN HFA) 108 (90 Base) MCG/ACT inhaler Inhale 1-2 puffs into the lungs every 6 (six) hours as needed for wheezing or shortness of breath. 12/09/19   Eugenie Filler, MD  amLODipine (NORVASC) 10 MG tablet Take 1 tablet (10 mg total) by mouth daily. 04/05/21   Lacinda Axon, MD  aspirin EC 81 MG tablet Take 81 mg by  mouth daily. Swallow whole.    [provider]  bictegravir-emtricitabine-tenofovir AF (BIKTARVY) 50-200-25 MG TABS tablet Take 1 tablet by mouth daily. 04/25/21   Mignon Pine, DO  budesonide-formoterol (SYMBICORT) 160-4.5 MCG/ACT inhaler Inhale 2 puffs into the lungs in the morning and at bedtime. 04/22/21   [provider]  buPROPion (WELLBUTRIN XL) 150 MG 24 hr tablet Take 150 mg by mouth every morning. 04/22/21   [provider]  diclofenac Sodium (VOLTAREN) 1 % GEL Apply 2 g topically 2 (two) times daily as needed (for pain in shoulders, neck, and knees).    [provider]  insulin glargine (LANTUS) 100 UNIT/ML Solostar Pen Inject 30 Units into the skin daily. Patient taking differently: Inject 20 Units into the skin at bedtime. 12/09/19   Eugenie Filler, MD  insulin lispro (HUMALOG KWIKPEN) 100 UNIT/ML KwikPen Inject 0.04 mLs (4 Units total) into the skin 3 (three) times daily with meals. 12/09/19   Eugenie Filler, MD  Insulin Pen Needle 31G X 5 MM MISC 4 Units by Does not apply route in the morning, at noon, and at bedtime. 12/09/19   Eugenie Filler, MD  isosorbide mononitrate (IMDUR) 60 MG 24 hr  tablet Take 2 tablets (120 mg total) by mouth daily. 04/04/21 10/01/21  Lacinda Axon, MD  lidocaine (LIDODERM) 5 % Place 1 patch onto the skin daily as needed (For chestwall pain). Remove & Discard patch within 12 hours or as directed by MD 04/04/21   Lacinda Axon, MD  mirtazapine (REMERON) 15 MG tablet Take 15 mg by mouth at bedtime as needed (sleep). 04/18/21   [provider]  nitroGLYCERIN (NITROSTAT) 0.4 MG SL tablet Place 1 tablet (0.4 mg total) under the tongue every 5 (five) minutes as needed for chest pain. 12/09/19   Eugenie Filler, MD  pantoprazole (PROTONIX) 40 MG tablet Take 40 mg by mouth daily as needed (for heartburn).    [provider]  traZODone (DESYREL) 50 MG tablet Take 50 mg by mouth at bedtime as needed for sleep. 06/28/21   [provider]  XARELTO 15 MG TABS tablet Take 15 mg by mouth daily. 04/22/21   [provider]    Allergies    Patient has no known allergies.  Review of Systems   Review of Systems  Constitutional:  Negative for activity change, appetite change and fever.  HENT:  Negative for congestion and rhinorrhea.   Respiratory:  Positive for chest tightness and shortness of breath.   Cardiovascular:  Positive for chest pain.  Gastrointestinal:  Negative for abdominal pain, nausea and vomiting.  Genitourinary:  Negative for dysuria and hematuria.  Musculoskeletal:  Negative for arthralgias and myalgias.  Skin:  Negative for rash.  Neurological:  Negative for dizziness, weakness and headaches.  Psychiatric/Behavioral:  Positive for self-injury and suicidal ideas. The patient is hyperactive.    all other systems are negative except as noted in the HPI and PMH.   Physical Exam Updated Vital Signs BP (!) 171/108   Pulse 80   Temp 97.9 F (36.6 C) (Oral)   Resp 20   Ht 5\' 8"  (1.727 m)   Wt 70.8 kg   SpO2 92%   BMI 23.72 kg/m   Physical Exam Vitals and nursing note reviewed.  Constitutional:       General: He is not in acute distress.    Appearance: He is well-developed. He is not ill-appearing.     Comments: Flat affect, but  gives very quiet answers  HENT:     Head: Normocephalic and atraumatic.     Mouth/Throat:     Pharynx: No oropharyngeal exudate.  Eyes:     Conjunctiva/sclera: Conjunctivae normal.     Pupils: Pupils are equal, round, and reactive to light.  Neck:     Comments: No meningismus. Cardiovascular:     Rate and Rhythm: Normal rate and regular rhythm.     Heart sounds: Normal heart sounds. No murmur heard. Pulmonary:     Effort: Pulmonary effort is normal. No respiratory distress.     Breath sounds: Normal breath sounds.     Comments: Pain worse with palpation Chest:     Chest wall: Tenderness present.  Abdominal:     Palpations: Abdomen is soft.     Tenderness: There is no abdominal tenderness. There is no guarding or rebound.  Musculoskeletal:        General: No tenderness. Normal range of motion.     Cervical back: Normal range of motion and neck supple.  Skin:    General: Skin is warm.  Neurological:     Mental Status: He is alert and oriented to person, place, and time.     Cranial Nerves: No cranial nerve deficit.     Motor: No abnormal muscle tone.     Coordination: Coordination normal.     Comments:  5/5 strength throughout. CN 2-12 intact.Equal grip strength.   Psychiatric:        Behavior: Behavior normal.    ED Results / Procedures / Treatments   Labs (all labs ordered are listed, but only abnormal results are displayed) Labs Reviewed  COMPREHENSIVE METABOLIC PANEL - Abnormal; Notable for the following components:      Result Value   Glucose, Bld 207 (*)    BUN 37 (*)    Creatinine, Ser 2.24 (*)    GFR, Estimated 31 (*)    All other components within normal limits  SALICYLATE LEVEL - Abnormal; Notable for the following components:   Salicylate Lvl <8.1 (*)    All other components within normal limits  ACETAMINOPHEN LEVEL -  Abnormal; Notable for the following components:   Acetaminophen (Tylenol), Serum <10 (*)    All other components within normal limits  CBC - Abnormal; Notable for the following components:   RBC 3.90 (*)    Hemoglobin 11.1 (*)    HCT 34.8 (*)    All other components within normal limits  RAPID URINE DRUG SCREEN, HOSP PERFORMED - Abnormal; Notable for the following components:   Cocaine POSITIVE (*)    All other components within normal limits  URINALYSIS, ROUTINE W REFLEX MICROSCOPIC - Abnormal; Notable for the following components:   APPearance HAZY (*)    Glucose, UA 150 (*)    Hgb urine dipstick MODERATE (*)    Protein, ur >=300 (*)    All other components within normal limits  TROPONIN I (HIGH SENSITIVITY) - Abnormal; Notable for the following components:   Troponin I (High Sensitivity) 21 (*)    All other components within normal limits  TROPONIN I (HIGH SENSITIVITY) - Abnormal; Notable for the following components:   Troponin I (High Sensitivity) 18 (*)    All other components within normal limits  RESP PANEL BY RT-PCR (FLU A&B, COVID) ARPGX2  ETHANOL  TROPONIN I (HIGH SENSITIVITY)    EKG EKG Interpretation  Date/Time:  Sunday September 09 2021 00:42:30 EST Ventricular Rate:  78 PR Interval:  149 QRS Duration: 78  QT Interval:  388 QTC Calculation: 442 R Axis:   68 Text Interpretation: Sinus arrhythmia Consider left ventricular hypertrophy Anterior Q waves, possibly due to LVH Nonspecific T abnormalities, lateral leads No significant change was found Confirmed by [name] [phone]) on 09/09/2021 1:55:02 AM  Radiology DG Chest 2 View  Result Date: 09/09/2021 CLINICAL DATA:  Chest pain EXAM: CHEST - 2 VIEW COMPARISON:  08/28/2021 FINDINGS: The heart size and mediastinal contours are within normal limits. Both lungs are clear. The visualized skeletal structures are unremarkable. Remote median sternotomy and left atrial appendage clipping. IMPRESSION: No active  cardiopulmonary disease. Electronically Signed   By: Ulyses Jarred M.D.   On: 09/09/2021 00:57    Procedures Procedures   Medications Ordered in ED Medications  aspirin chewable tablet 324 mg (has no administration in time range)    ED Course  I have reviewed the triage vital signs and the nursing notes.  Pertinent labs & imaging results that were available during my care of the patient were reviewed by me and considered in my medical decision making (see chart for details).    MDM Rules/Calculators/A&P                           Apparently chronic chest pain ongoing for greater than 1 year since his CABG.  Also suicidal thoughts.  Vitals are stable, no distress.  Record review shows previous visits for chronic chest pain.  EKG without acute ischemia.  Chest x-ray is negative.  Creatinine is at baseline.  Troponin minimally elevated 21.  We will check serial enzymes  Troponin remains flat. Second troponin is 18.  Low suspicion for ACS.  Patient with ongoing chronic chest pain for years that is reproducible to palpation.  Low suspicion for ACS, aortic dissection, pulmonary embolism. Drug screen is positive for cocaine again.  Patient continues to complain of feeling suicidal. He is medically clear for TTS evaluation.  Holding orders are placed Final Clinical Impression(s) / ED Diagnoses Final diagnoses:  None    Rx / DC Orders ED Discharge Orders     None        Arieanna Pressey, Annie Main, MD 09/09/21 [phone]

## 2021-09-09 NOTE — ED Notes (Signed)
One bag of pt belongings placed in cabinet above nurse's station marked 9-12.

## 2021-09-10 DIAGNOSIS — R45851 Suicidal ideations: Secondary | ICD-10-CM

## 2021-09-10 DIAGNOSIS — F1494 Cocaine use, unspecified with cocaine-induced mood disorder: Secondary | ICD-10-CM

## 2021-09-10 LAB — CBG MONITORING, ED
Glucose-Capillary: 165 mg/dL — ABNORMAL HIGH (ref 70–99)
Glucose-Capillary: 289 mg/dL — ABNORMAL HIGH (ref 70–99)

## 2021-09-10 MED ORDER — RIVAROXABAN 15 MG PO TABS: 15.0000 mg | ORAL_TABLET | Freq: Every day | ORAL | Status: DC

## 2021-09-10 MED ORDER — ALBUTEROL SULFATE (2.5 MG/3ML) 0.083% IN NEBU: 2.5000 mg | INHALATION_SOLUTION | Freq: Four times a day (QID) | RESPIRATORY_TRACT | Status: DC | PRN

## 2021-09-10 NOTE — Consult Note (Signed)
[facility] ED Discharge  09/10/2021 11:31 AM Michael [name]  MRN:  [id number]  Method of visit?: Face to Face   Principal Problem: Cocaine-induced mood disorder [facility]) Discharge Diagnoses: Principal Problem:   Cocaine-induced mood disorder ([place]) Active Problems:   Cocaine use disorder, severe, dependence ([name])  HPI: Michael [name] is a 70 year old male patient with past history of cocaine abuse severe, HIV, chest pain, DM, acute kidney injury who presented to [facility] voluntarily for SI over past month. Upon arrival patient began complaining of chest pain; troponin obtained, 21. UDS+cocaine.  Subjective: Michael [name] is a 70 year old male with history of CAD, status post CABG, cocaine abuse, DM type 2, HTN, diabetic foot infection, anemia.  Patient is very well known to our facility, and others in the area as he frequently presents with similar presentation for chest pain secondary to substance use.  Patient is seen and reassessed this nurse practitioner.  He continues to endorse passive suicidal ideations, in the setting of chronic substance abuse.  He is wanting help for his substance use and depression.  Upon reassessment today patient appears to be resting in stretcher, reports smoking cocaine daily.  He denies any additional substance abuse at this time.  Patient reports he is currently homeless, and is requesting assistance for different treatment options. His suicidal ideations are chonic in nature and lileky related to his substance abuse. He denies any recent or current self-harm, or suicidal thoughts with a plan.  He is able to contract for safety.  He further denies homicidal ideations and or hallucinations at this time.  Michael [name] is a 70 y.o.  with a history significant for malingering, polysubstance abuse, and depression, who presented to the ED for chest pain secondary to cocaine use, suicidal thoughts with no plan. Given this extensive history, it appears that  inpatient psychiatric hospitalization would not be very beneficial and the patient is coming to the emergency department for some type of secondary gain.   Patient continues to present for concern for malingering. Patient reported that his SI is likely due to substance use, and has already mentioned that his homelessness is a large contribution to his depression. Provider has also noticed that patient's affect appears more positive now and patient speak more on assessment and is more interactive. Patient has a hx of long psychiatric stays with little improvement and need for housing. Patient is determined to be stable for discharge as he appears to be malingering.    Total Time spent with patient: 30 minutes  Past Psychiatric History: Depression, substance abuse.  Previous psychiatric medication trials include Wellbutrin, Lexapro, Remeron, trazodone.  History of chronic passive suicidal ideations, attempt by cutting years ago.  No suicide attempts of high lethality that resulted in medical intervention.  No recent inpatient hospitalizations, or rehab admissions.  Past Medical History:  Past Medical History:  Diagnosis Date   A-fib ([place])    Asthma    No PFTs, history of childhood asthma   CAD (coronary artery disease)    a. 06/2013 STEMI/PCI ([facility]): LAD w/ thrombus (treated with BMS), mid 75%, D2 75%; LCX OM2 75%; RCA small, PDA 95%, PLV 95%;  b. 10/2013 Cath/PCI: ISR w/in LAD (Promus DES x 2), borderline OM2 lesion;  c. 01/2014 MV: Intermediate risk, medium-sized distal ant wall infarct w/ very small amt of peri-infarct ischemia. EF 60%.   Cellulitis 04/2014   left facial   Cellulitis and abscess of toe of right foot 12/08/2019  Chondromalacia of medial femoral condyle    Left knee MRI 04/28/12: Chondromalacia of the medial femoral condyle with slight peripheral degeneration of the meniscocapsular junction of the medial meniscus; followed by sports medicine   Collagen vascular disease ([place])    Crack  cocaine use    for 20+ years, has been enrolled in detox programs in the past   Depression    with history of hospitalization for suicidal ideation   Diabetes mellitus 2002   Diagnosed in 2002, started insulin in 2012   Gout    Gout 04/28/2012   Headache(784.0)    CT head 08/2011: Periventricular and subcortical white matter hypodensities are most in keeping with chronic microangiopathic change   HIV infection [facility]) Nov 2012   Followed by [name]   Hyperlipidemia    Hypertension    Pulmonary embolism [facility])     Past Surgical History:  Procedure Laterality Date   AMPUTATION Right 07/21/2019   Procedure: RIGHT SECOND TOE AMPUTATION;  Surgeon: [name], MD;  Location: [facility];  Service: Orthopedics;  Laterality: Right;   BACK SURGERY     1988   BOWEL RESECTION     CARDIAC SURGERY     CERVICAL SPINE SURGERY     " rods in my neck "   CORONARY ARTERY BYPASS GRAFT     CORONARY STENT PLACEMENT     NM MYOCAR PERF WALL MOTION  12/27/2011   normal   SPINE SURGERY     Family History:  Family History  Problem Relation Age of Onset   Diabetes Mother    Hypertension Mother    Hyperlipidemia Mother    Diabetes Father    Cancer Father    Hypertension Father    Diabetes Brother    Heart disease Brother    Diabetes Sister    Colon cancer Neg Hx    Family Psychiatric  History: Denies Social History:  Social History   Substance and Sexual Activity  Alcohol Use No   Alcohol/week: 4.0 standard drinks   Types: 2 Cans of beer, 2 Shots of liquor per week     Social History   Substance and Sexual Activity  Drug Use Not Currently   Frequency: 4.0 times per week   Types: "Crack" cocaine, Cocaine   Comment: Recent use of crack    Social History   Socioeconomic History   Marital status: Widowed    Spouse name: Not on file   Number of children: 2   Years of education: 59   Highest education level: 12th grade  Occupational History    Employer: UNEMPLOYED    Comment:  04/2016  Tobacco Use   Smoking status: Never   Smokeless tobacco: Never  Vaping Use   Vaping Use: Never used  Substance and Sexual Activity   Alcohol use: No    Alcohol/week: 4.0 standard drinks    Types: 2 Cans of beer, 2 Shots of liquor per week   Drug use: Not Currently    Frequency: 4.0 times per week    Types: "Crack" cocaine, Cocaine    Comment: Recent use of crack   Sexual activity: Yes    Comment: DECLINED CONDOMS  Other Topics Concern   Not on file  Social History Narrative   Currently staying with a friend in [place].  Was staying @ local motel until a few days ago - left b/c of bed bugs.   P[facility].  Not followed by a psychiatrist.  Social Determinants of Health   Financial Resource Strain: Not on file  Food Insecurity: Not on file  Transportation Needs: Not on file  Physical Activity: Not on file  Stress: Not on file  Social Connections: Not on file    Tobacco Cessation:  N/A, patient does not currently use tobacco products  Current Medications: Current Facility-Administered Medications  Medication Dose Route Frequency Provider Last Rate Last Admin   albuterol (PROVENTIL) (2.5 MG/3ML) 0.083% nebulizer solution 2.5 mg  2.5 mg Nebulization Q6H PRN Wofford, Cindie Laroche, RPH       amLODipine (NORVASC) tablet 10 mg  10 mg Oral Daily Rancour, Stephen, MD   10 mg at 09/09/21 1118   bictegravir-emtricitabine-tenofovir AF (BIKTARVY) 50-200-25 MG per tablet 1 tablet  1 tablet Oral Daily Rancour, Stephen, MD       buPROPion (WELLBUTRIN XL) 24 hr tablet 150 mg  150 mg Oral q morning Rancour, Stephen, MD   150 mg at 09/09/21 1118   insulin aspart (novoLOG) injection 0-9 Units  0-9 Units Subcutaneous TID WC Rancour, Annie Main, MD   5 Units at 09/10/21 0942   insulin glargine-yfgn (SEMGLEE) injection 20 Units  20 Units Subcutaneous QHS Rancour, Stephen, MD       isosorbide mononitrate (IMDUR) 24 hr tablet 120 mg  120 mg Oral Daily Rancour, Stephen, MD        mometasone-formoterol (DULERA) 200-5 MCG/ACT inhaler 2 puff  2 puff Inhalation BID Rancour, Annie Main, MD       Rivaroxaban (XARELTO) tablet 15 mg  15 mg Oral Q supper Wofford, Drew A, RPH       traZODone (DESYREL) tablet 50 mg  50 mg Oral QHS PRN Rancour, Stephen, MD       Current Outpatient Medications  Medication Sig Dispense Refill   aspirin EC 81 MG tablet Take 81 mg by mouth daily. Swallow whole.     bictegravir-emtricitabine-tenofovir AF (BIKTARVY) 50-200-25 MG TABS tablet Take 1 tablet by mouth daily. 30 tablet 11   albuterol (VENTOLIN HFA) 108 (90 Base) MCG/ACT inhaler Inhale 1-2 puffs into the lungs every 6 (six) hours as needed for wheezing or shortness of breath. 8 g 1   amLODipine (NORVASC) 10 MG tablet Take 1 tablet (10 mg total) by mouth daily. 60 tablet 2   budesonide-formoterol (SYMBICORT) 160-4.5 MCG/ACT inhaler Inhale 2 puffs into the lungs in the morning and at bedtime.     buPROPion (WELLBUTRIN XL) 150 MG 24 hr tablet Take 150 mg by mouth every morning.     diclofenac Sodium (VOLTAREN) 1 % GEL Apply 2 g topically 2 (two) times daily as needed (for pain in shoulders, neck, and knees).     insulin glargine (LANTUS) 100 UNIT/ML Solostar Pen Inject 30 Units into the skin daily. (Patient taking differently: Inject 20 Units into the skin at bedtime.) 15 mL 1   insulin lispro (HUMALOG KWIKPEN) 100 UNIT/ML KwikPen Inject 0.04 mLs (4 Units total) into the skin 3 (three) times daily with meals. 15 mL 0   Insulin Pen Needle 31G X 5 MM MISC 4 Units by Does not apply route in the morning, at noon, and at bedtime. 100 each 0   isosorbide mononitrate (IMDUR) 60 MG 24 hr tablet Take 2 tablets (120 mg total) by mouth daily. 60 tablet 2   lidocaine (LIDODERM) 5 % Place 1 patch onto the skin daily as needed (For chestwall pain). Remove & Discard patch within 12 hours or as directed by MD 30 patch 0  mirtazapine (REMERON) 15 MG tablet Take 15 mg by mouth at bedtime as needed (sleep).      nitroGLYCERIN (NITROSTAT) 0.4 MG SL tablet Place 1 tablet (0.4 mg total) under the tongue every 5 (five) minutes as needed for chest pain. 10 tablet 0   pantoprazole (PROTONIX) 40 MG tablet Take 40 mg by mouth daily as needed (for heartburn).     traZODone (DESYREL) 50 MG tablet Take 50 mg by mouth at bedtime as needed for sleep.     XARELTO 15 MG TABS tablet Take 15 mg by mouth daily.     PTA Medications: (Not in a hospital admission)   Musculoskeletal: Strength & Muscle Tone:  Denies Gait & Station: normal Patient leans: N/A  Psychiatric Specialty Exam:  Presentation  General Appearance: Appropriate for Environment; Casual Eye Contact:Fair Speech:Clear and Coherent; Slow Speech Volume:Normal Handedness:Right  Mood and Affect  Mood:Depressed Affect:Appropriate; Congruent  Thought Process  Thought Processes:Coherent; Linear Descriptions of Associations:Intact Orientation:Full (Time, Place and Person) Thought Content:Logical History of Schizophrenia/Schizoaffective disorder:No  Duration of Psychotic Symptoms:No data recorded Hallucinations:Hallucinations: None Ideas of Reference:None Suicidal Thoughts:Suicidal Thoughts: Yes, Passive SI Passive Intent and/or Plan: Without Intent; Without Plan; Without Means to Carry Out; Without Access to Means Homicidal Thoughts:Homicidal Thoughts: No  Sensorium  Memory:Immediate Fair; Recent Fair; Remote Fair Judgment:Fair Insight:Fair  Executive Functions  Concentration:Fair Attention Span:Fair [place]  Psychomotor Activity  Psychomotor Activity:Psychomotor Activity: Normal  Assets  Assets:Communication Skills; Resilience; Social Support; Desire for Improvement  Sleep  Sleep:Sleep: Fair   Physical Exam: Physical Exam Vitals reviewed.  Neurological:     General: No focal deficit present.     Mental Status: He is oriented to person, place, and time.  Psychiatric:        Mood  and Affect: Mood normal.        Behavior: Behavior normal.   Review of Systems  Psychiatric/Behavioral:  Positive for substance abuse (cocaine) and suicidal ideas (passive).   All other systems reviewed and are negative. Blood pressure 132/84, pulse 79, temperature 98 F (36.7 C), temperature source Oral, resp. rate 15, height 5\' 8"  (1.727 m), weight 70.8 kg, SpO2 100 %. Body mass index is 23.72 kg/m.   Demographic Factors:  Male  Loss Factors: Decrease in vocational status, Decline in physical health, and Financial problems/change in socioeconomic status  Historical Factors: Prior suicide attempts  Risk Reduction Factors:   Sense of responsibility to family, Positive social support, Positive therapeutic relationship, and Positive coping skills or problem solving skills  Continued Clinical Symptoms:  Alcohol/Substance Abuse/Dependencies Unstable or Poor Therapeutic Relationship Previous Psychiatric Diagnoses and Treatments Medical Diagnoses and Treatments/Surgeries  Cognitive Features That Contribute To Risk:  None    Suicide Risk:  Minimal: No identifiable suicidal ideation.  Patients presenting with no risk factors but with morbid ruminations; may be classified as minimal risk based on the severity of the depressive symptoms    Plan Of Care/Follow-up recommendations:  Other:  Psych clear. May need TOC for transportation or voucher to shelter, and consider referrals to substance abuse facility.  Continue current medications.  -Outpatient referral to [facility].  Disposition: Psych Cleared.  Suella Broad, FNP 09/10/2021, 11:31 AM

## 2021-09-10 NOTE — BH Assessment (Signed)
[facility] Assessment Progress Note   Per Sheran Fava, NP, this voluntary pt does not require psychiatric hospitalization at this time.  Pt is psychiatrically cleared.  Discharge instructions include referral information for area substance use disorder treatment providers.  EDP [name], MD and pt's nurse, Jarrett Soho, have been notified.  Jalene Mullet, [place] Triage Specialist [phone]

## 2021-09-10 NOTE — ED Provider Notes (Addendum)
Emergency Medicine Observation Re-evaluation Note  Michael [name] is a 70 y.o. male, seen on rounds today.  Pt initially presented to the ED for complaints of Suicidal Currently, the patient is resting in the ED.  Physical Exam  BP 132/84 (BP Location: Left Arm)   Pulse 79   Temp 98 F (36.7 C) (Oral)   Resp 15   Ht 1.727 m (5\' 8" )   Wt 70.8 kg   SpO2 100%   BMI 23.72 kg/m  Physical Exam General: Calm, sleeping Cardiac: Regular rate Lungs: Wheezing easily Psych: Deferred  ED Course / MDM  EKG:EKG Interpretation  Date/Time:  Sunday September 09 2021 00:42:30 EST Ventricular Rate:  78 PR Interval:  149 QRS Duration: 78 QT Interval:  388 QTC Calculation: 442 R Axis:   68 Text Interpretation: Sinus arrhythmia Consider left ventricular hypertrophy Anterior Q waves, possibly due to LVH Nonspecific T abnormalities, lateral leads No significant change was found Confirmed by [name] [phone]) on 09/09/2021 1:55:02 AM  I have reviewed the labs performed to date as well as medications administered while in observation.  Recent changes in the last 24 hours include initial assessment by psychiatry yesterday morning.  Patient also evaluated for chest pain in the setting of recent cocaine use.  Troponin was slightly elevated initially at 21 but second was declining at 18.  No signs of acute coronary syndrome.  Plan  Current plan is for reassessment by psychiatry.  Michael [name] is not under involuntary commitment.     [name], MD 09/10/21 [phone] Patient was assessed by psychiatry.  He is cleared for outpatient management   [name], MD 09/10/21 1134

## 2021-09-10 NOTE — Discharge Instructions (Signed)
To help you maintain a sober lifestyle, a substance use disorder treatment program may be beneficial to you.  Contact one of the following providers at your earliest opportunity to ask about enrolling their program.  As an alternative, contact the customer service number on your health insurance card to find an in-network service provider in your community:  [name]:       [facility].      [place], [place] [address]      [address] [place] [address]      [phone]       [facility]      [address]      [place], [place] [address]      [phone]  OUTPATIENT PROGRAMS:       [facility] (private insurance)      [name] [place] [address]      [phone]      CD-IOP (afternoon program); individual therapy       [facility] ([place] insurance)      [phone]      [place], [place] [address]      [phone]      CD-IOP (morning & evening programs); individual therapy

## 2021-09-10 NOTE — ED Notes (Signed)
Belongings returned to patient.

## 2021-10-06 ENCOUNTER — Other Ambulatory Visit: Payer: Self-pay

## 2021-10-06 ENCOUNTER — Emergency Department (HOSPITAL_COMMUNITY)
Admission: EM | Admit: 2021-10-06 | Discharge: 2021-10-08 | Disposition: A | Discharge status: Home / Self Care | Payer: Medicare Other | Attending: Emergency Medicine | Admitting: Emergency Medicine

## 2021-10-06 ENCOUNTER — Encounter (HOSPITAL_COMMUNITY): Payer: Self-pay | Admitting: Emergency Medicine

## 2021-10-06 DIAGNOSIS — E119 Type 2 diabetes mellitus without complications: Secondary | ICD-10-CM | POA: Insufficient documentation

## 2021-10-06 DIAGNOSIS — F339 Major depressive disorder, recurrent, unspecified: Secondary | ICD-10-CM | POA: Insufficient documentation

## 2021-10-06 DIAGNOSIS — R1013 Epigastric pain: Secondary | ICD-10-CM | POA: Insufficient documentation

## 2021-10-06 DIAGNOSIS — F1994 Other psychoactive substance use, unspecified with psychoactive substance-induced mood disorder: Secondary | ICD-10-CM

## 2021-10-06 DIAGNOSIS — Z20822 Contact with and (suspected) exposure to covid-19: Secondary | ICD-10-CM | POA: Diagnosis not present

## 2021-10-06 DIAGNOSIS — J45909 Unspecified asthma, uncomplicated: Secondary | ICD-10-CM | POA: Insufficient documentation

## 2021-10-06 DIAGNOSIS — R45851 Suicidal ideations: Secondary | ICD-10-CM | POA: Insufficient documentation

## 2021-10-06 DIAGNOSIS — I2510 Atherosclerotic heart disease of native coronary artery without angina pectoris: Secondary | ICD-10-CM | POA: Insufficient documentation

## 2021-10-06 DIAGNOSIS — I10 Essential (primary) hypertension: Secondary | ICD-10-CM | POA: Diagnosis not present

## 2021-10-06 DIAGNOSIS — Z21 Asymptomatic human immunodeficiency virus [HIV] infection status: Secondary | ICD-10-CM | POA: Insufficient documentation

## 2021-10-06 DIAGNOSIS — Z7901 Long term (current) use of anticoagulants: Secondary | ICD-10-CM | POA: Insufficient documentation

## 2021-10-06 DIAGNOSIS — Z79899 Other long term (current) drug therapy: Secondary | ICD-10-CM | POA: Insufficient documentation

## 2021-10-06 DIAGNOSIS — Z794 Long term (current) use of insulin: Secondary | ICD-10-CM | POA: Diagnosis not present

## 2021-10-06 DIAGNOSIS — F32A Depression, unspecified: Secondary | ICD-10-CM | POA: Diagnosis not present

## 2021-10-06 DIAGNOSIS — Z951 Presence of aortocoronary bypass graft: Secondary | ICD-10-CM | POA: Insufficient documentation

## 2021-10-06 DIAGNOSIS — Z7982 Long term (current) use of aspirin: Secondary | ICD-10-CM | POA: Diagnosis not present

## 2021-10-06 DIAGNOSIS — R0789 Other chest pain: Secondary | ICD-10-CM | POA: Diagnosis not present

## 2021-10-06 DIAGNOSIS — R079 Chest pain, unspecified: Secondary | ICD-10-CM

## 2021-10-06 NOTE — ED Triage Notes (Signed)
Pt BIB [facility], c/o chest pain x 1 year since having bypass surgery. Reports feeling suicidal and has stopped taking his medications x 1 week. Given 324mg  asa.

## 2021-10-07 ENCOUNTER — Emergency Department (HOSPITAL_COMMUNITY): Payer: Medicare Other

## 2021-10-07 DIAGNOSIS — F1994 Other psychoactive substance use, unspecified with psychoactive substance-induced mood disorder: Secondary | ICD-10-CM

## 2021-10-07 LAB — BASIC METABOLIC PANEL
Anion gap: 9 (ref 5–15)
BUN: 42 mg/dL — ABNORMAL HIGH (ref 8–23)
CO2: 23 mmol/L (ref 22–32)
Calcium: 8.8 mg/dL — ABNORMAL LOW (ref 8.9–10.3)
Chloride: 106 mmol/L (ref 98–111)
Creatinine, Ser: 2.75 mg/dL — ABNORMAL HIGH (ref 0.61–1.24)
GFR, Estimated: 24 mL/min — ABNORMAL LOW (ref >60)
Glucose, Bld: 202 mg/dL — ABNORMAL HIGH (ref 70–99)
Potassium: 4.2 mmol/L (ref 3.5–5.1)
Sodium: 138 mmol/L (ref 135–145)

## 2021-10-07 LAB — RAPID URINE DRUG SCREEN, HOSP PERFORMED
Amphetamines: NOT DETECTED
Barbiturates: NOT DETECTED
Benzodiazepines: NOT DETECTED
Cocaine: POSITIVE — AB
Opiates: NOT DETECTED
Tetrahydrocannabinol: NOT DETECTED

## 2021-10-07 LAB — CBC
HCT: 30.2 % — ABNORMAL LOW (ref 39.0–52.0)
Hemoglobin: 9.9 g/dL — ABNORMAL LOW (ref 13.0–17.0)
MCH: 28.4 pg (ref 26.0–34.0)
MCHC: 32.8 g/dL (ref 30.0–36.0)
MCV: 86.5 fL (ref 80.0–100.0)
Platelets: 166 10*3/uL (ref 150–400)
RBC: 3.49 MIL/uL — ABNORMAL LOW (ref 4.22–5.81)
RDW: 12.8 % (ref 11.5–15.5)
WBC: 3.8 10*3/uL — ABNORMAL LOW (ref 4.0–10.5)
nRBC: 0 % (ref 0.0–0.2)

## 2021-10-07 LAB — TROPONIN I (HIGH SENSITIVITY)
Troponin I (High Sensitivity): 56 ng/L — ABNORMAL HIGH (ref <18)
Troponin I (High Sensitivity): 59 ng/L — ABNORMAL HIGH (ref <18)

## 2021-10-07 LAB — CBG MONITORING, ED
Glucose-Capillary: 114 mg/dL — ABNORMAL HIGH (ref 70–99)
Glucose-Capillary: 165 mg/dL — ABNORMAL HIGH (ref 70–99)
Glucose-Capillary: 178 mg/dL — ABNORMAL HIGH (ref 70–99)

## 2021-10-07 LAB — ETHANOL: Alcohol, Ethyl (B): <10 mg/dL (ref <10)

## 2021-10-07 LAB — RESP PANEL BY RT-PCR (FLU A&B, COVID) ARPGX2
Influenza A by PCR: NEGATIVE
Influenza B by PCR: NEGATIVE
SARS Coronavirus 2 by RT PCR: NEGATIVE

## 2021-10-07 LAB — SALICYLATE LEVEL: Salicylate Lvl: <7 mg/dL — ABNORMAL LOW (ref 7.0–30.0)

## 2021-10-07 LAB — ACETAMINOPHEN LEVEL: Acetaminophen (Tylenol), Serum: <10 ug/mL — ABNORMAL LOW (ref 10–30)

## 2021-10-07 MED ORDER — INSULIN GLARGINE 100 UNIT/ML ~~LOC~~ SOLN
20.0000 [IU] | Freq: Every day | SUBCUTANEOUS | Status: DC
  Administered 2021-10-07: 20 [IU] via SUBCUTANEOUS
  Filled 2021-10-07 (×2): qty 0.2

## 2021-10-07 MED ORDER — FLUTICASONE FUROATE-VILANTEROL 200-25 MCG/ACT IN AEPB
1.0000 | INHALATION_SPRAY | Freq: Every day | RESPIRATORY_TRACT | Status: DC
  Administered 2021-10-08: 1 via RESPIRATORY_TRACT
  Filled 2021-10-07 (×2): qty 28

## 2021-10-07 MED ORDER — MIRTAZAPINE 15 MG PO TABS: 15.0000 mg | ORAL_TABLET | Freq: Every evening | ORAL | Status: DC | PRN

## 2021-10-07 MED ORDER — ISOSORBIDE MONONITRATE ER 30 MG PO TB24
120.0000 mg | ORAL_TABLET | Freq: Every day | ORAL | Status: DC
  Administered 2021-10-07 – 2021-10-08 (×2): 120 mg via ORAL
  Filled 2021-10-07 (×2): qty 4

## 2021-10-07 MED ORDER — ALBUTEROL SULFATE HFA 108 (90 BASE) MCG/ACT IN AERS: 1.0000 | INHALATION_SPRAY | Freq: Four times a day (QID) | RESPIRATORY_TRACT | Status: DC | PRN

## 2021-10-07 MED ORDER — ACETAMINOPHEN 500 MG PO TABS
1000.0000 mg | ORAL_TABLET | Freq: Once | ORAL | Status: AC
  Administered 2021-10-07: 1000 mg via ORAL
  Filled 2021-10-07: qty 2

## 2021-10-07 MED ORDER — PANTOPRAZOLE SODIUM 40 MG PO TBEC: 40.0000 mg | DELAYED_RELEASE_TABLET | Freq: Every day | ORAL | Status: DC | PRN

## 2021-10-07 MED ORDER — BICTEGRAVIR-EMTRICITAB-TENOFOV 50-200-25 MG PO TABS
1.0000 | ORAL_TABLET | Freq: Every day | ORAL | Status: DC
  Administered 2021-10-08: 1 via ORAL
  Filled 2021-10-07 (×2): qty 1

## 2021-10-07 MED ORDER — BUPROPION HCL ER (XL) 150 MG PO TB24
150.0000 mg | ORAL_TABLET | Freq: Every morning | ORAL | Status: DC
  Administered 2021-10-08: 150 mg via ORAL
  Filled 2021-10-07 (×2): qty 1

## 2021-10-07 MED ORDER — TRAZODONE HCL 50 MG PO TABS: 50.0000 mg | ORAL_TABLET | Freq: Every evening | ORAL | Status: DC | PRN

## 2021-10-07 MED ORDER — INSULIN ASPART 100 UNIT/ML IJ SOLN
4.0000 [IU] | Freq: Three times a day (TID) | INTRAMUSCULAR | Status: DC
  Administered 2021-10-07 – 2021-10-08 (×3): 4 [IU] via SUBCUTANEOUS

## 2021-10-07 MED ORDER — ASPIRIN EC 81 MG PO TBEC
81.0000 mg | DELAYED_RELEASE_TABLET | Freq: Every day | ORAL | Status: DC
  Administered 2021-10-07 – 2021-10-08 (×2): 81 mg via ORAL
  Filled 2021-10-07 (×2): qty 1

## 2021-10-07 MED ORDER — ALBUTEROL SULFATE (2.5 MG/3ML) 0.083% IN NEBU: 2.5000 mg | INHALATION_SOLUTION | Freq: Four times a day (QID) | RESPIRATORY_TRACT | Status: DC | PRN

## 2021-10-07 MED ORDER — AMLODIPINE BESYLATE 5 MG PO TABS
10.0000 mg | ORAL_TABLET | Freq: Every day | ORAL | Status: DC
  Administered 2021-10-07 – 2021-10-08 (×2): 10 mg via ORAL
  Filled 2021-10-07 (×2): qty 2

## 2021-10-07 MED ORDER — RIVAROXABAN 15 MG PO TABS
15.0000 mg | ORAL_TABLET | Freq: Every day | ORAL | Status: DC
  Administered 2021-10-07: 15 mg via ORAL
  Filled 2021-10-07 (×3): qty 1

## 2021-10-07 NOTE — ED Provider Notes (Signed)
Patient was assessed by the psychiatry team may have cleared him for discharge and Alpain and management.   [name], MD 10/07/21 [phone]

## 2021-10-07 NOTE — BH Assessment (Signed)
Comprehensive Clinical Assessment (CCA) Note  10/07/2021 Michael [name] [id number]  Disposition:  Gave clinical report to S. Jerelene Redden, NP, who determined that Pt is psych-cleared.  He will be provided with shelter info.  The patient demonstrates the following risk factors for suicide: Chronic risk factors for suicide include: psychiatric disorder of MDD, substance use disorder, previous suicide attempts at least one, and demographic factors (male, >53 y/o). Acute risk factors for suicide include: unemployment and social withdrawal/isolation. Protective factors for this patient include:  insight, offered support . Considering these factors, the overall suicide risk at this point appears to be low. Patient is appropriate for outpatient follow up.   [facility] from 10/06/2021 in [place] ED from 09/08/2021 in [name] DEPT ED from 08/28/2021 in [facility] Low Risk Moderate Risk High Risk       Chief Complaint:  Chief Complaint  Patient presents with   Chest Pain   Suicidal    Pt is a 71 year old male who presented to [facility] on a voluntary basis with complaint of recent cocaine use, suicidal ideation (passive), auditory and visual hallucination, despondency, and other symptoms   Visit Diagnosis: Substance-induced mood disorder   Narrative:  Pt is a 71 year old male who presented to [facility] on a voluntary basis with complaint of chest pain.  He told staff that he was suicidal and had other symptoms as well.  Pt is on disability due to work-related injury from 1985, and he is homeless.  Pt does not have an outpatient therapist or psychiatrist.  Pt reported that he used cocaine yesterday --''a lot' -- and he now feels suicidal (no plan or intent).  Pt reported that he attempted suicide about a year ago by cutting his wrists.  Pt endorsed the following symptoms:   Passive suicidal ideation, persistent despondency, insomnia, worry, and auditory and visual hallucinations (men urging him to harm himself).  Pt also endorsed monthly use of cocaine.  He reported last use was 10/06/2021.  Pt also endorsed past instances of self-harm by cutting to relieve stress.  Pt denied homicidal ideation.  During assessment, Pt presented as alert and oriented.  He had good eye contact and was cooperative.  Pt was dressed in scrubs, and he appeared appropriately groomed.  Pt's mood was depressed.  Affect was full range.  Pt's demeanor was calm.  Pt's speech was normal in rate, rhythm, and volume.  Thought processes were within normal range, and thought content was logical and goal-oriented.  There was no evidence of delusion.  Memory and concentration intact.  Insight was fair.  Judgment and impulse control were poor as evidenced by recent cocaine use.  CCA Screening, Triage and Referral (STR)  Patient Reported Information How did you hear about [place]? Self  What Is the Reason for Your Visit/Call Today? Pt endorsed suicidal ideation without plan or intent (passive), AVH, and other symptoms, along with cocaine use.  How Long Has This Been Causing You Problems? > than 6 months  What Do You Feel Would Help You the Most Today? Alcohol or Drug Use Treatment; Treatment for Depression or other mood problem; Housing Assistance; Medication(s)   Have You Recently Had Any Thoughts About Hurting Yourself? Yes  Are You Planning to Commit Suicide/Harm Yourself At This time? No   Have you Recently Had Thoughts About [place]? No  Are You Planning to Harm Someone at  This Time? No  Explanation: No data recorded  Have You Used Any Alcohol or Drugs in the Past 24 Hours? Yes  How Long Ago Did You Use Drugs or Alcohol? No data recorded What Did You Use and How Much? Unknown quantity of cocaine   Do You Currently Have a Therapist/Psychiatrist? No  Name of  Therapist/Psychiatrist: No data recorded  Have You Been Recently Discharged From Any Office Practice or Programs? No  Explanation of Discharge From Practice/Program: No data recorded    CCA Screening Triage Referral Assessment Type of Contact: Tele-Assessment  Telemedicine Service Delivery: Telemedicine service delivery: This service was provided via telemedicine using a 2-way, interactive audio and video technology  Is this Initial or Reassessment? Initial Assessment  Date Telepsych consult ordered in [facility]:  10/07/21  Time Telepsych consult ordered in [facility]:  2044  Location of Assessment: [facility] ED  Provider Location: [facility] Assessment Services   Collateral Involvement: NA   Does Patient Have a [place]? No data recorded Name and Contact of Legal Guardian: No data recorded If Minor and Not Living with Parent(s), Who has Custody? No data recorded Is CPS involved or ever been involved? Never  Is APS involved or ever been involved? Never   Patient Determined To Be At Risk for Harm To Self or Others Based on Review of Patient Reported Information or Presenting Complaint? No  Method: No data recorded Availability of Means: No data recorded Intent: No data recorded Notification Required: No data recorded Additional Information for Danger to Others Potential: No data recorded Additional Comments for Danger to Others Potential: No data recorded Are There Guns or Other Weapons in Your Home? No data recorded Types of Guns/Weapons: No data recorded Are These Weapons Safely Secured?                            No data recorded Who Could Verify You Are Able To Have These Secured: No data recorded Do You Have any Outstanding Charges, Pending Court Dates, Parole/Probation? No data recorded Contacted To Inform of Risk of Harm To Self or Others: No data recorded   Does Patient Present under Involuntary Commitment? No  IVC Papers Initial File Date: No data  recorded  [place] of Residence: [place]   Patient Currently Receiving the Following Services: Not Receiving Services   Determination of Need: Urgent (48 hours)   Options For Referral: Medication Management; Chemical Dependency Intensive Outpatient Therapy (CDIOP); Intensive Outpatient Therapy; Outpatient Therapy     CCA Biopsychosocial Patient Reported Schizophrenia/Schizoaffective Diagnosis in Past: No   Strengths: No data recorded  Mental Health Symptoms Depression:   Change in energy/activity; Fatigue; Hopelessness; Sleep (too much or little); Worthlessness   Duration of Depressive symptoms:  Duration of Depressive Symptoms: Greater than two weeks   Mania:   None   Anxiety:    Sleep; Worrying   Psychosis:   Hallucinations   Duration of Psychotic symptoms:  Duration of Psychotic Symptoms: Less than six months   Trauma:   None   Obsessions:   None   Compulsions:   None   Inattention:   Disorganized; Forgetful; Loses things   Hyperactivity/Impulsivity:   None   Oppositional/Defiant Behaviors:   None   Emotional Irregularity:   Potentially harmful impulsivity; Recurrent suicidal behaviors/gestures/threats   Other Mood/Personality Symptoms:  No data recorded   Mental Status Exam Appearance and self-care  Stature:   Average   Weight:   Average  weight   Clothing:   Casual   Grooming:   Normal   Cosmetic use:   None   Posture/gait:   Normal   Motor activity:   Not Remarkable   Sensorium  Attention:   Normal   Concentration:   Normal   Orientation:   X5   Recall/memory:   Normal   Affect and Mood  Affect:   Congruent; Depressed   Mood:   Depressed   Relating  Eye contact:   Normal   Facial expression:   Depressed   Attitude toward examiner:   Cooperative   Thought and Language  Speech flow:  Normal   Thought content:   Appropriate to Mood and Circumstances   Preoccupation:   None   Hallucinations:    Auditory; Visual   Organization:  No data recorded  [employer] of Knowledge:   Average   Intelligence:   Average   Abstraction:   Normal   Judgement:   Fair   Reality Testing:   Adequate   Insight:   Fair   Decision Making:   Only simple   Social Functioning  Social Maturity:   Isolates   Social Judgement:   "Games developer"   Stress  Stressors:   Housing; Illness   Coping Ability:   Programme researcher, broadcasting/film/video Deficits:   Decision making   Supports:   Support needed     Religion: Religion/Spirituality Are You A Religious Person?: Yes What is Your Religious Affiliation?: Methodist  Leisure/Recreation: Leisure / Recreation Do You Have Hobbies?: No  Exercise/Diet: Exercise/Diet Do You Exercise?: No Have You Gained or Lost A Significant Amount of Weight in the Past Six Months?: No Number of Pounds Lost?:  (Per history, Pt recently lost 19 lbs.) Do You Follow a Special Diet?: No Do You Have Any Trouble Sleeping?: Yes Explanation of Sleeping Difficulties: Pt reports, 2-3 hours per night. Pt reports, he's homeless and sleeps on the streets.   CCA Employment/Education Employment/Work Situation: Employment / Work Technical sales engineer: On disability Why is Patient on Disability: Physical injury on the job How Long has Patient Been on Disability: Since October 1985 Patient's Job has Been Impacted by Current Illness: No Has Patient ever Been in the [employer]?: No  Education: Education Is Patient Currently Attending School?: No Last Grade Completed: 12 Did You Attend College?: No Did You Have An Individualized Education Program (IIEP): No Did You Have Any Difficulty At School?: No Patient's Education Has Been Impacted by Current Illness: No   CCA Family/Childhood History Family and Relationship History: Family history Marital status: Widowed Widowed, when?: Pt reports, his wife died two years ago. Does patient have  children?: Yes How many children?: 2 How is patient's relationship with their children?: Two adult children -- one son, one daughter.  Distant relationship  Childhood History:  Childhood History By whom was/is the patient raised?: Both parents Did patient suffer from severe childhood neglect?: No Has patient ever been sexually abused/assaulted/raped as an adolescent or adult?: No Was the patient ever a victim of a crime or a disaster?: No Witnessed domestic violence?: No Has patient been affected by domestic violence as an adult?: No  Child/Adolescent Assessment:     CCA Substance Use Alcohol/Drug Use: Alcohol / Drug Use Pain Medications: Please see MAR Prescriptions: Please see MAR Over the Counter: Please see MAR History of alcohol / drug use?: Yes Longest period of sobriety (when/how long): 2 months Negative Consequences of Use: Personal relationships, Financial Withdrawal  Symptoms: None Substance #1 Name of Substance 1: Cocaine. 1 - Amount (size/oz): Varied -- not sure 1 - Frequency: Monthly 1 - Duration: Ongoing 1 - Last Use / Amount: 10/06/2021 -- ''a lot'' 1 - Method of Aquiring: Street purchase 1- Route of Use: Nasal ingestion                       ASAM's:  Six Dimensions of Multidimensional Assessment  Dimension 1:  Acute Intoxication and/or Withdrawal Potential:   Dimension 1:  Description of individual's past and current experiences of substance use and withdrawal: Ongoing use  Dimension 2:  Biomedical Conditions and Complications:   Dimension 2:  Description of patient's biomedical conditions and  complications: Recent chest pain  Dimension 3:  Emotional, Behavioral, or Cognitive Conditions and Complications:  Dimension 3:  Description of emotional, behavioral, or cognitive conditions and complications: Depression  Dimension 4:  Readiness to Change:     Dimension 5:  Relapse, Continued use, or Continued Problem Potential:     Dimension 6:   Recovery/Living Environment:     ASAM Severity Score: ASAM's Severity Rating Score: 11  ASAM Recommended Level of Treatment: ASAM Recommended Level of Treatment: Level II Intensive Outpatient Treatment   Substance use Disorder (SUD) Substance Use Disorder (SUD)  Checklist Symptoms of Substance Use: Continued use despite having a persistent/recurrent physical/psychological problem caused/exacerbated by use  Recommendations for Services/Supports/Treatments: Recommendations for Services/Supports/Treatments Recommendations For Services/Supports/Treatments: SAIOP (Substance Abuse Intensive Outpatient Program), Individual Therapy  Discharge Disposition:    DSM5 Diagnoses: Patient Active Problem List   Diagnosis Date Noted   Iron deficiency anemia 04/04/2021   Atypical chest pain 04/03/2021   Homelessness 12/13/2019   Cellulitis 12/07/2019   Osteomyelitis ([place]) 07/17/2019   Abscess or cellulitis of toe, right    Toe pain, right-second    MDD (major depressive disorder), recurrent episode, severe ([place]) 06/15/2019   Respiratory distress 04/12/2019   Pneumonia due to severe acute respiratory syndrome coronavirus 2 (SARS-CoV-2) 04/11/2019   COVID-19 virus infection 03/19/2019   Chest pain 10/03/2018   Malnutrition of moderate degree 09/21/2018   MDD (major depressive disorder), recurrent episode, moderate ([name])    Type II diabetes mellitus with renal manifestations ([place]) 05/04/2018   GERD (gastroesophageal reflux disease) 05/04/2018   S/P CABG (coronary artery bypass graft)    Left testicular pain    Depression 02/20/2018   Epididymo-orchitis, acute 02/20/2018   Essential hypertension 02/20/2018   Precordial chest pain 02/20/2018   AKI (acute kidney injury) ([place]) 02/14/2018   HCAP (healthcare-associated pneumonia) 02/14/2018   History of pulmonary embolism 07/04/2017   Acute hyponatremia 05/11/2017   Hyperglycemia due to type 2 diabetes mellitus ([place]) 05/11/2017   CHF (congestive  heart failure) ([place]) 04/01/2017   Hepatitis C 12/30/2016   Substance induced mood disorder ([place]) 11/23/2016   Chronic ischemic heart disease 11/12/2016   Paroxysmal A-fib ([place]) 11/12/2016   Back pain 04/18/2016   S/P carotid endarterectomy 11/15/2015   MDD (major depressive disorder), recurrent severe, without psychosis ([place]) 09/09/2015   Cocaine-induced mood disorder ([place]) 08/14/2015   Cocaine abuse with cocaine-induced mood disorder ([place]) 08/14/2015   Gout 07/10/2015   Acute renal failure superimposed on stage 3 chronic kidney disease ([place]) 03/06/2015   Anemia 03/06/2015   Chronic kidney disease, stage III (moderate) ([name]) 03/06/2015   Hypoglycemia    Encounter for general adult medical examination with abnormal findings 02/09/2015   Cocaine use disorder, severe, dependence ([place]) 12/13/2014   Substance or medication-induced  depressive disorder with onset during withdrawal ([place]) 12/13/2014   Severe recurrent major depressive disorder with psychotic features ([place]) 12/12/2014   Cervicalgia 06/28/2014   Lumbar radiculopathy, chronic 06/28/2014   Asthma, chronic 02/03/2014   3-vessel CAD 06/24/2013   ED (erectile dysfunction) of organic origin 07/07/2012   Hypertension goal BP (blood pressure) < 140/80 04/29/2012   Chondromalacia of left knee 03/19/2012   Hyperlipidemia with target LDL less than 100 02/12/2012   Fibromyalgia 02/12/2012   HIV (human immunodeficiency virus infection) ([place]) 08/27/2011   Uncontrolled type 2 diabetes with neuropathy 10/17/2000     Referrals to Alternative Service(s): Referred to Alternative Service(s):   Place:   Date:   Time:    Referred to Alternative Service(s):   Place:   Date:   Time:    Referred to Alternative Service(s):   Place:   Date:   Time:    Referred to Alternative Service(s):   Place:   Date:   Time:     Marlowe Aschoff, [facility]

## 2021-10-07 NOTE — Discharge Instructions (Addendum)
Follow-up with the recommendations of the behavioral health team  Information on my medicine - XARELTO (Rivaroxaban)  This medication education was reviewed with me or my healthcare representative as part of my discharge preparation.    Why was Xarelto prescribed for you? Xarelto was prescribed for you to reduce the risk of a blood clot forming that can cause a stroke if you have a medical condition called atrial fibrillation (a type of irregular heartbeat).  What do you need to know about xarelto ? Take your Xarelto ONCE DAILY at the same time every day with your evening meal. If you have difficulty swallowing the tablet whole, you may crush it and mix in applesauce just prior to taking your dose.  Take Xarelto exactly as prescribed by your doctor and DO NOT stop taking Xarelto without talking to the doctor who prescribed the medication.  Stopping without other stroke prevention medication to take the place of Xarelto may increase your risk of developing a clot that causes a stroke.  Refill your prescription before you run out.  After discharge, you should have regular check-up appointments with your healthcare provider that is prescribing your Xarelto.  In the future your dose may need to be changed if your kidney function or weight changes by a significant amount.  What do you do if you miss a dose? If you are taking Xarelto ONCE DAILY and you miss a dose, take it as soon as you remember on the same day then continue your regularly scheduled once daily regimen the next day. Do not take two doses of Xarelto at the same time or on the same day.   Important Safety Information A possible side effect of Xarelto is bleeding. You should call your healthcare provider right away if you experience any of the following: Bleeding from an injury or your nose that does not stop. Unusual colored urine (red or dark brown) or unusual colored stools (red or black). Unusual bruising for unknown  reasons. A serious fall or if you hit your head (even if there is no bleeding).  Some medicines may interact with Xarelto and might increase your risk of bleeding while on Xarelto. To help avoid this, consult your healthcare provider or pharmacist prior to using any new prescription or non-prescription medications, including herbals, vitamins, non-steroidal anti-inflammatory drugs (NSAIDs) and supplements.  This website has more information on Xarelto: [email or web address]

## 2021-10-07 NOTE — BH Assessment (Signed)
TTS spoke with [facility] RN, to complete TTS assessment.  Per nurse pt is currently sleeping.  TTS to see pt at a later time.

## 2021-10-07 NOTE — ED Notes (Signed)
Patient on TTS 

## 2021-10-07 NOTE — ED Notes (Signed)
Pt wanded by security. 

## 2021-10-07 NOTE — ED Notes (Signed)
Pt belongings inventoried and placed in [facility] large locker #5. No valuables to inventory/place with security.

## 2021-10-07 NOTE — ED Provider Notes (Signed)
[facility] Emergency Department Provider Note MRN:  [id number]  Arrival date & time: 10/07/21     Chief Complaint   Chest Pain and Suicidal   History of Present Illness   Michael [name] is a 71 y.o. year-old male with a history of CAD presenting to the ED with chief complaint of chest pain and suicidal.  Patient has been feeling increasing depression over the past several weeks.  Feels very lonely, has no loved ones or family.  Does not have a specific plan to kill himself but has been having suicidal thoughts.  Patient is also exhibiting chest pain since yesterday.  Center of the chest, pressure.  Stopped taking all of his medicines about a week ago, says they bother his stomach.  Used cocaine today.  Review of Systems  A thorough review of systems was obtained and all systems are negative except as noted in the HPI and PMH.   Patient's Health History    Past Medical History:  Diagnosis Date   A-fib ([place])    Asthma    No PFTs, history of childhood asthma   CAD (coronary artery disease)    a. 06/2013 STEMI/PCI ([facility]): LAD w/ thrombus (treated with BMS), mid 75%, D2 75%; LCX OM2 75%; RCA small, PDA 95%, PLV 95%;  b. 10/2013 Cath/PCI: ISR w/in LAD (Promus DES x 2), borderline OM2 lesion;  c. 01/2014 MV: Intermediate risk, medium-sized distal ant wall infarct w/ very small amt of peri-infarct ischemia. EF 60%.   Cellulitis 04/2014   left facial   Cellulitis and abscess of toe of right foot 12/08/2019   Chondromalacia of medial femoral condyle    Left knee MRI 04/28/12: Chondromalacia of the medial femoral condyle with slight peripheral degeneration of the meniscocapsular junction of the medial meniscus; followed by sports medicine   Collagen vascular disease ([place])    Crack cocaine use    for 20+ years, has been enrolled in detox programs in the past   Depression    with history of hospitalization for suicidal ideation   Diabetes mellitus 2002   Diagnosed in  2002, started insulin in 2012   Gout    Gout 04/28/2012   Headache(784.0)    CT head 08/2011: Periventricular and subcortical white matter hypodensities are most in keeping with chronic microangiopathic change   HIV infection [facility]) Nov 2012   Followed by [name]   Hyperlipidemia    Hypertension    Pulmonary embolism [facility])     Past Surgical History:  Procedure Laterality Date   AMPUTATION Right 07/21/2019   Procedure: RIGHT SECOND TOE AMPUTATION;  Surgeon: [name], MD;  Location: [facility];  Service: Orthopedics;  Laterality: Right;   BACK SURGERY     1988   BOWEL RESECTION     CARDIAC SURGERY     CERVICAL SPINE SURGERY     " rods in my neck "   CORONARY ARTERY BYPASS GRAFT     CORONARY STENT PLACEMENT     NM MYOCAR PERF WALL MOTION  12/27/2011   normal   SPINE SURGERY      Family History  Problem Relation Age of Onset   Diabetes Mother    Hypertension Mother    Hyperlipidemia Mother    Diabetes Father    Cancer Father    Hypertension Father    Diabetes Brother    Heart disease Brother    Diabetes Sister    Colon cancer Neg Hx  Social History   Socioeconomic History   Marital status: Widowed    Spouse name: Not on file   Number of children: 2   Years of education: 27   Highest education level: 12th grade  Occupational History    Employer: UNEMPLOYED    Comment: 04/2016  Tobacco Use   Smoking status: Never   Smokeless tobacco: Never  Vaping Use   Vaping Use: Never used  Substance and Sexual Activity   Alcohol use: No    Alcohol/week: 4.0 standard drinks    Types: 2 Cans of beer, 2 Shots of liquor per week   Drug use: Not Currently    Frequency: 4.0 times per week    Types: "Crack" cocaine, Cocaine    Comment: Recent use of crack   Sexual activity: Yes    Comment: DECLINED CONDOMS  Other Topics Concern   Not on file  Social History Narrative   Currently staying with a friend in [place].  Was staying @ local motel until a few days ago - left b/c of  bed bugs.   P[facility] up [facility].  Not followed by a psychiatrist.   Social Determinants of Health   Financial Resource Strain: Not on file  Food Insecurity: Not on file  Transportation Needs: Not on file  Physical Activity: Not on file  Stress: Not on file  Social Connections: Not on file  Intimate Partner Violence: Not on file     Physical Exam   Vitals:   10/06/21 2335  BP: (!) 141/76  Pulse: 83  Resp: 18  Temp: 98.2 F (36.8 C)  SpO2: 98%    CONSTITUTIONAL: Well-appearing, NAD NEURO:  Alert and oriented x 3, no focal deficits EYES:  eyes equal and reactive ENT/NECK:  no LAD, no JVD CARDIO: Regular rate, well-perfused, normal S1 and S2 PULM:  CTAB no wheezing or rhonchi GI/GU:  non-distended, non-tender MSK/SPINE:  No gross deformities, no edema SKIN:  no rash, atraumatic   *Additional and/or pertinent findings included in MDM below  Diagnostic and Interventional Summary    EKG Interpretation  Date/Time:  Saturday October 06 2021 23:25:40 EST Ventricular Rate:  84 PR Interval:  138 QRS Duration: 80 QT Interval:  412 QTC Calculation: 486 R Axis:   73 Text Interpretation: Normal sinus rhythm Minimal voltage criteria for LVH, may be normal variant ( Sokolow-Lyon ) Nonspecific T wave abnormality Prolonged QT Abnormal ECG When compared with ECG of 09-Sep-2021 00:42, PREVIOUS ECG IS PRESENT Confirmed by Gerlene Fee [phone]) on 10/07/2021 1:09:51 AM       Labs Reviewed  BASIC METABOLIC PANEL - Abnormal; Notable for the following components:      Result Value   Glucose, Bld 202 (*)    BUN 42 (*)    Creatinine, Ser 2.75 (*)    Calcium 8.8 (*)    GFR, Estimated 24 (*)    All other components within normal limits  CBC - Abnormal; Notable for the following components:   WBC 3.8 (*)    RBC 3.49 (*)    Hemoglobin 9.9 (*)    HCT 30.2 (*)    All other components within normal limits  SALICYLATE LEVEL - Abnormal; Notable for the following components:    Salicylate Lvl <3.4 (*)    All other components within normal limits  ACETAMINOPHEN LEVEL - Abnormal; Notable for the following components:   Acetaminophen (Tylenol), Serum <10 (*)    All other components within normal limits  RAPID URINE DRUG SCREEN,  HOSP PERFORMED - Abnormal; Notable for the following components:   Cocaine POSITIVE (*)    All other components within normal limits  TROPONIN I (HIGH SENSITIVITY) - Abnormal; Notable for the following components:   Troponin I (High Sensitivity) 59 (*)    All other components within normal limits  TROPONIN I (HIGH SENSITIVITY) - Abnormal; Notable for the following components:   Troponin I (High Sensitivity) 56 (*)    All other components within normal limits  RESP PANEL BY RT-PCR (FLU A&B, COVID) ARPGX2  ETHANOL    DG Chest 2 View  Final Result      Medications  albuterol (VENTOLIN HFA) 108 (90 Base) MCG/ACT inhaler 1-2 puff (has no administration in time range)  amLODipine (NORVASC) tablet 10 mg (has no administration in time range)  aspirin EC tablet 81 mg (has no administration in time range)  bictegravir-emtricitabine-tenofovir AF (BIKTARVY) 50-200-25 MG per tablet 1 tablet (has no administration in time range)  fluticasone furoate-vilanterol (BREO ELLIPTA) 200-25 MCG/ACT 1 puff (has no administration in time range)  buPROPion (WELLBUTRIN XL) 24 hr tablet 150 mg (has no administration in time range)  insulin glargine (LANTUS) Solostar Pen 20 Units (has no administration in time range)  insulin lispro (HUMALOG) KwikPen 4 Units (has no administration in time range)  isosorbide mononitrate (IMDUR) 24 hr tablet 120 mg (has no administration in time range)  mirtazapine (REMERON) tablet 15 mg (has no administration in time range)  pantoprazole (PROTONIX) EC tablet 40 mg (has no administration in time range)  traZODone (DESYREL) tablet 50 mg (has no administration in time range)  Rivaroxaban (XARELTO) tablet 15 mg (has no administration  in time range)  acetaminophen (TYLENOL) tablet 1,000 mg (1,000 mg Oral Given 10/07/21 0200)     Procedures  /  Critical Care Procedures  ED Course and Medical Decision Making  I have reviewed the triage vital signs, the nursing notes, and pertinent available records from the EMR.  Listed above are laboratory and imaging tests that I personally ordered, reviewed, and interpreted and then considered in my medical decision making (see below for details).  Patient seems depressed, would likely benefit from psychiatric evaluation.  Psychomotor depression on exam.  Patient also presenting with chest pain, has a history of CAD with stents, has been noncompliant with all medications for a week.  This is also complicated by recent cocaine use.  EKG interpreted by me, some nonspecific changes.  Troponin is mildly elevated, will repeat.  If downtrending, may be able to medically clear the patient with the thought that the small elevation is related to some heart strain or damage in the setting of medication noncompliance and cocaine use.     Troponin is downtrending, patient's pain is adequately controlled, patient is now medically cleared, will restart home meds and await TTS recommendations.  Barth Kirks. Sedonia Small, [employer] [email or web address]  Final Clinical Impressions(s) / ED Diagnoses     ICD-10-CM   1. Chest pain, unspecified type  R07.9     2. Suicidal ideation  R45.851     3. Depression, unspecified depression type  F32.A       ED Discharge Orders     None        Discharge Instructions Discussed with and Provided to Patient:   Discharge Instructions   None       Maudie Flakes, MD 10/07/21 [phone]

## 2021-10-08 ENCOUNTER — Other Ambulatory Visit: Payer: Self-pay

## 2021-10-08 ENCOUNTER — Encounter (HOSPITAL_COMMUNITY): Payer: Self-pay

## 2021-10-08 ENCOUNTER — Emergency Department (HOSPITAL_COMMUNITY): Payer: Medicare Other

## 2021-10-08 ENCOUNTER — Emergency Department (HOSPITAL_COMMUNITY)
Admission: EM | Admit: 2021-10-08 | Discharge: 2021-10-08 | Disposition: A | Discharge status: Home / Self Care | Payer: Medicare Other | Source: Home / Self Care | Attending: Emergency Medicine | Admitting: Emergency Medicine

## 2021-10-08 DIAGNOSIS — R1013 Epigastric pain: Secondary | ICD-10-CM | POA: Insufficient documentation

## 2021-10-08 DIAGNOSIS — E119 Type 2 diabetes mellitus without complications: Secondary | ICD-10-CM | POA: Insufficient documentation

## 2021-10-08 DIAGNOSIS — Z951 Presence of aortocoronary bypass graft: Secondary | ICD-10-CM | POA: Insufficient documentation

## 2021-10-08 DIAGNOSIS — I10 Essential (primary) hypertension: Secondary | ICD-10-CM | POA: Insufficient documentation

## 2021-10-08 DIAGNOSIS — Z7982 Long term (current) use of aspirin: Secondary | ICD-10-CM | POA: Insufficient documentation

## 2021-10-08 DIAGNOSIS — Z7901 Long term (current) use of anticoagulants: Secondary | ICD-10-CM | POA: Insufficient documentation

## 2021-10-08 DIAGNOSIS — R748 Abnormal levels of other serum enzymes: Secondary | ICD-10-CM

## 2021-10-08 DIAGNOSIS — I2510 Atherosclerotic heart disease of native coronary artery without angina pectoris: Secondary | ICD-10-CM | POA: Insufficient documentation

## 2021-10-08 DIAGNOSIS — Z794 Long term (current) use of insulin: Secondary | ICD-10-CM | POA: Insufficient documentation

## 2021-10-08 DIAGNOSIS — Z79899 Other long term (current) drug therapy: Secondary | ICD-10-CM | POA: Insufficient documentation

## 2021-10-08 DIAGNOSIS — R45851 Suicidal ideations: Secondary | ICD-10-CM | POA: Insufficient documentation

## 2021-10-08 DIAGNOSIS — F339 Major depressive disorder, recurrent, unspecified: Secondary | ICD-10-CM | POA: Insufficient documentation

## 2021-10-08 DIAGNOSIS — Z21 Asymptomatic human immunodeficiency virus [HIV] infection status: Secondary | ICD-10-CM | POA: Insufficient documentation

## 2021-10-08 DIAGNOSIS — R079 Chest pain, unspecified: Secondary | ICD-10-CM | POA: Insufficient documentation

## 2021-10-08 LAB — CBC WITH DIFFERENTIAL/PLATELET
Abs Immature Granulocytes: 0.02 10*3/uL (ref 0.00–0.07)
Basophils Absolute: 0 10*3/uL (ref 0.0–0.1)
Basophils Relative: 1 %
Eosinophils Absolute: 0.1 10*3/uL (ref 0.0–0.5)
Eosinophils Relative: 3 %
HCT: 29.4 % — ABNORMAL LOW (ref 39.0–52.0)
Hemoglobin: 9.5 g/dL — ABNORMAL LOW (ref 13.0–17.0)
Immature Granulocytes: 1 %
Lymphocytes Relative: 29 %
Lymphs Abs: 1.1 10*3/uL (ref 0.7–4.0)
MCH: 28.3 pg (ref 26.0–34.0)
MCHC: 32.3 g/dL (ref 30.0–36.0)
MCV: 87.5 fL (ref 80.0–100.0)
Monocytes Absolute: 0.5 10*3/uL (ref 0.1–1.0)
Monocytes Relative: 12 %
Neutro Abs: 2 10*3/uL (ref 1.7–7.7)
Neutrophils Relative %: 54 %
Platelets: 153 10*3/uL (ref 150–400)
RBC: 3.36 MIL/uL — ABNORMAL LOW (ref 4.22–5.81)
RDW: 12.7 % (ref 11.5–15.5)
WBC: 3.7 10*3/uL — ABNORMAL LOW (ref 4.0–10.5)
nRBC: 0 % (ref 0.0–0.2)

## 2021-10-08 LAB — COMPREHENSIVE METABOLIC PANEL
ALT: 16 U/L (ref 0–44)
AST: 18 U/L (ref 15–41)
Albumin: 3.4 g/dL — ABNORMAL LOW (ref 3.5–5.0)
Alkaline Phosphatase: 78 U/L (ref 38–126)
Anion gap: 5 (ref 5–15)
BUN: 41 mg/dL — ABNORMAL HIGH (ref 8–23)
CO2: 25 mmol/L (ref 22–32)
Calcium: 8.4 mg/dL — ABNORMAL LOW (ref 8.9–10.3)
Chloride: 107 mmol/L (ref 98–111)
Creatinine, Ser: 2.43 mg/dL — ABNORMAL HIGH (ref 0.61–1.24)
GFR, Estimated: 28 mL/min — ABNORMAL LOW (ref >60)
Glucose, Bld: 309 mg/dL — ABNORMAL HIGH (ref 70–99)
Potassium: 4.6 mmol/L (ref 3.5–5.1)
Sodium: 137 mmol/L (ref 135–145)
Total Bilirubin: 0.6 mg/dL (ref 0.3–1.2)
Total Protein: 6.7 g/dL (ref 6.5–8.1)

## 2021-10-08 LAB — CBG MONITORING, ED: Glucose-Capillary: 260 mg/dL — ABNORMAL HIGH (ref 70–99)

## 2021-10-08 LAB — ETHANOL: Alcohol, Ethyl (B): <10 mg/dL (ref <10)

## 2021-10-08 LAB — LIPASE, BLOOD: Lipase: 73 U/L — ABNORMAL HIGH (ref 11–51)

## 2021-10-08 LAB — TROPONIN I (HIGH SENSITIVITY): Troponin I (High Sensitivity): 21 ng/L — ABNORMAL HIGH (ref <18)

## 2021-10-08 MED ORDER — FAMOTIDINE 20 MG PO TABS
20.0000 mg | ORAL_TABLET | Freq: Once | ORAL | Status: AC
Start: 2021-10-08 — End: 2021-10-08
  Administered 2021-10-08: 20 mg via ORAL
  Filled 2021-10-08: qty 1

## 2021-10-08 MED ORDER — INSULIN GLARGINE-YFGN 100 UNIT/ML ~~LOC~~ SOLN
20.0000 [IU] | Freq: Every day | SUBCUTANEOUS | Status: DC
  Filled 2021-10-08: qty 0.2

## 2021-10-08 NOTE — Progress Notes (Signed)
°   10/08/21 1110  TOC ED Mini Assessment  TOC Time spent with patient (minutes): 20  TOC Time saved using PING (minutes): 20  PING Used in TOC Assessment No  Admission or Readmission Diverted Yes  Interventions which prevented an admission or readmission Homeless Screening  What brought you to the Emergency Department?  Chest Pain  Barriers to Discharge ED Shelter capacity;ED Transportation  Barrier interventions Shelter resources provided including day shelter [facility]); Bus voucher  [employer] Teacher, early years/pre

## 2021-10-08 NOTE — ED Notes (Signed)
Patient made aware of discharge status.

## 2021-10-08 NOTE — Discharge Instructions (Addendum)
Your work-up here is unremarkable.  Your CT scan did not show any inflammation in your stomach area or in your pancreas  Your heart enzymes are stable and actually improved from yesterday  You need to avoid doing any cocaine  Please call the shelter list tomorrow to try and get into a shelter  You need to see your doctor and also see a counselor for mental health help  Return to ER if you have thoughts of harming yourself or others, severe chest pain, abdominal pain

## 2021-10-08 NOTE — ED Provider Notes (Signed)
[facility] DEPT Provider Note   CSN: [id number] Arrival date & time: 10/08/21  1829     History  Chief Complaint  Patient presents with   Chest Pain    Michael [name] is a 71 y.o. male history of CAD status post CABG, diabetes, hypertension, HIV here presenting with chest pain.  Patient was seen yesterday in the ED.  He had 2 troponins that are stable around 50s.  He was seen by psychiatry and was cleared by psychiatry.  He had suicidal ideations at that time.  However he had no plan.  He continues to have no plan to kill himself.  He however he states that he is homeless and he did not find a shelter today.  He was at the store and called EMS to come to be evaluated.  Denies any alcohol use  The history is provided by the patient.      Home Medications Prior to Admission medications   Medication Sig Start Date End Date Taking? Authorizing Provider  albuterol (VENTOLIN HFA) 108 (90 Base) MCG/ACT inhaler Inhale 1-2 puffs into the lungs every 6 (six) hours as needed for wheezing or shortness of breath. 12/09/19   Eugenie Filler, MD  amLODipine (NORVASC) 10 MG tablet Take 1 tablet (10 mg total) by mouth daily. 04/05/21   Lacinda Axon, MD  aspirin EC 81 MG tablet Take 81 mg by mouth daily. Swallow whole.    [provider]  bictegravir-emtricitabine-tenofovir AF (BIKTARVY) 50-200-25 MG TABS tablet Take 1 tablet by mouth daily. 04/25/21   Mignon Pine, DO  budesonide-formoterol (SYMBICORT) 160-4.5 MCG/ACT inhaler Inhale 2 puffs into the lungs in the morning and at bedtime. 04/22/21   [provider]  buPROPion (WELLBUTRIN XL) 150 MG 24 hr tablet Take 150 mg by mouth every morning. 04/22/21   [provider]  diclofenac Sodium (VOLTAREN) 1 % GEL Apply 2 g topically 2 (two) times daily as needed (for pain in shoulders, neck, and knees).    [provider]  insulin glargine (LANTUS) 100 UNIT/ML Solostar Pen Inject  30 Units into the skin daily. Patient taking differently: Inject 20 Units into the skin at bedtime. 12/09/19   Eugenie Filler, MD  insulin lispro (HUMALOG KWIKPEN) 100 UNIT/ML KwikPen Inject 0.04 mLs (4 Units total) into the skin 3 (three) times daily with meals. 12/09/19   Eugenie Filler, MD  Insulin Pen Needle 31G X 5 MM MISC 4 Units by Does not apply route in the morning, at noon, and at bedtime. 12/09/19   Eugenie Filler, MD  isosorbide mononitrate (IMDUR) 60 MG 24 hr tablet Take 2 tablets (120 mg total) by mouth daily. 04/04/21 10/01/21  Lacinda Axon, MD  lidocaine (LIDODERM) 5 % Place 1 patch onto the skin daily as needed (For chestwall pain). Remove & Discard patch within 12 hours or as directed by MD 04/04/21   Lacinda Axon, MD  mirtazapine (REMERON) 15 MG tablet Take 15 mg by mouth at bedtime as needed (sleep). 04/18/21   [provider]  nitroGLYCERIN (NITROSTAT) 0.4 MG SL tablet Place 1 tablet (0.4 mg total) under the tongue every 5 (five) minutes as needed for chest pain. 12/09/19   Eugenie Filler, MD  pantoprazole (PROTONIX) 40 MG tablet Take 40 mg by mouth daily as needed (for heartburn).    [provider]  traZODone (DESYREL) 50 MG tablet Take 50 mg by mouth at bedtime as needed for sleep.  06/28/21   [provider]  XARELTO 15 MG TABS tablet Take 15 mg by mouth daily. 04/22/21   [provider]      Allergies    Patient has no known allergies.    Review of Systems   Review of Systems  Cardiovascular:  Positive for chest pain.  All other systems reviewed and are negative.  Physical Exam Updated Vital Signs BP (!) 176/101 (BP Location: Left Arm)    Pulse 80    Temp 98 F (36.7 C) (Oral)    Resp 19    Ht 5\' 8"  (1.727 m)    Wt 73.9 kg    SpO2 100%    BMI 24.78 kg/m  Physical Exam Vitals and nursing note reviewed.  Constitutional:      Comments: Slightly depressed  HENT:     Head: Normocephalic and atraumatic.  Eyes:      Extraocular Movements: Extraocular movements intact.     Pupils: Pupils are equal, round, and reactive to light.  Cardiovascular:     Rate and Rhythm: Normal rate and regular rhythm.     Heart sounds: Normal heart sounds.  Pulmonary:     Effort: Pulmonary effort is normal.     Breath sounds: Normal breath sounds.  Abdominal:     General: Bowel sounds are normal.     Palpations: Abdomen is soft.  Musculoskeletal:        General: Normal range of motion.     Cervical back: Normal range of motion and neck supple.  Skin:    General: Skin is warm.     Capillary Refill: Capillary refill takes less than 2 seconds.  Psychiatric:        Mood and Affect: Mood normal.        Behavior: Behavior normal.    ED Results / Procedures / Treatments   Labs (all labs ordered are listed, but only abnormal results are displayed) Labs Reviewed  CBC WITH DIFFERENTIAL/PLATELET  COMPREHENSIVE METABOLIC PANEL  ETHANOL  LIPASE, BLOOD  TROPONIN I (HIGH SENSITIVITY)    EKG EKG Interpretation  Date/Time:  Monday October 08 2021 18:51:12 EST Ventricular Rate:  84 PR Interval:  136 QRS Duration: 79 QT Interval:  369 QTC Calculation: 437 R Axis:   75 Text Interpretation: Sinus arrhythmia Probable LVH with secondary repol abnrm No significant change since last tracing Confirmed by Wandra Arthurs [phone]) on 10/08/2021 6:54:03 PM  Radiology DG Chest 2 View  Result Date: 10/07/2021 CLINICAL DATA:  Chest pain. EXAM: CHEST - 2 VIEW COMPARISON:  September 09, 2021 FINDINGS: Multiple sternal wires are seen. The heart size and mediastinal contours are within normal limits. A radiopaque left atrial appendage clip and coronary artery stent are noted. Both lungs are clear. A radiopaque fixation plate and screws are seen overlying the cervical spine. Multilevel degenerative changes are seen throughout the thoracic spine. IMPRESSION: 1. Evidence of prior median sternotomy/CABG. 2. No acute or active cardiopulmonary disease.  Electronically Signed   By: Virgina Norfolk M.D.   On: 10/07/2021 01:01    Procedures Procedures    Medications Ordered in ED Medications  famotidine (PEPCID) tablet 20 mg (has no administration in time range)    ED Course/ Medical Decision Making/ A&P                           Medical Decision Making  Michael [name] is a 71 y.o. male here presenting with chest pain.  Patient is here with recurrent chest pain.  He does have history of CAD and CABG.  However he was seen yesterday had 2 troponins that were stable around 55.  We will get another troponin.  Patient also was seen by psychiatry for similar suicidal ideation and was cleared.  I will talk to social work about placement into a shelter  This patient presents to the ED for concern of chest pain and epigastric pain, this involves an extensive number of treatment options, and is a complaint that carries with it a high risk of complications and morbidity.  The differential diagnosis includes ACS, pancreatitis, gastritis   Co morbidities that complicate the patient evaluation CAD   Additional history obtained: Additional history obtained  External records from outside source obtained and reviewed including chart review   Lab Tests: I Ordered, and personally interpreted labs.  The pertinent results include: Lipase is 74 and normal alcohol level   Imaging Studies ordered: I ordered imaging studies including chest x-ray and CT abdomen pelvis I independently visualized and interpreted imaging which showed no obvious pancreatitis and patient has atelectasis right lung base I agree with the radiologist interpretation   Cardiac Monitoring: The patient was maintained on a cardiac monitor.  I personally viewed and interpreted the cardiac monitored which showed an underlying rhythm of: Normal sinus rhythm   Medicines ordered and prescription drug management: I ordered medication including pepcid  for  gastritis Reevaluation of the patient after these medicines showed that the patient improved I have reviewed the patients home medicines and have made adjustments as needed   Test Considered: CBC CMP and lipase and troponin   Critical Interventions: Patient had 2 stable troponins yesterday.  We will get 1 troponin today and if stable does not need another troponin.     Problem List / ED Course: Patient's lipase is elevated.  CT abdomen pelvis unremarkable.  Patient does have some atelectasis on CT.  However patient is not hypoxic and had negative COVID test yesterday.  Patient also has chronic suicidal ideation is unchanged.  He just saw psychiatry yesterday and was cleared by psychiatry. I also had social worker talk to him.  He was given a list of resources yesterday.  I told him that unfortunately I cannot keep him tonight and that I will need to discharge him and told him to call shelters   Reevaluation: After the interventions noted above, I reevaluated the patient and found that they have :improved   Social Determinants of Health: Homeless   Dispostion: After consideration of the diagnostic results and the patients response to treatment, I feel that the patent would benefit from finding a shelter.  Patient stable for discharge.     Final Clinical Impression(s) / ED Diagnoses Final diagnoses:  None    Rx / DC Orders ED Discharge Orders     None         Drenda Freeze, MD 10/08/21 2146

## 2021-10-08 NOTE — ED Provider Notes (Incomplete)
Emergency Medicine Observation Re-evaluation Note  Michael [name] is a 71 y.o. male, seen on rounds today.  Pt initially presented to the ED for complaints of Chest Pain and Suicidal (Pt is a 71 year old male who presented to [facility] on a voluntary basis with complaint of recent cocaine use, suicidal ideation (passive), auditory and visual hallucination, despondency, and other symptoms) Currently, the patient is ***.  Physical Exam  BP (!) 148/83 (BP Location: Left Arm)    Pulse 80    Temp 98.6 F (37 C)    Resp 14    SpO2 100%  Physical Exam General: *** Cardiac: *** Lungs: *** Psych: ***  ED Course / MDM  EKG:EKG Interpretation  Date/Time:  Saturday October 06 2021 23:25:40 EST Ventricular Rate:  84 PR Interval:  138 QRS Duration: 80 QT Interval:  412 QTC Calculation: 486 R Axis:   73 Text Interpretation: Normal sinus rhythm Minimal voltage criteria for LVH, may be normal variant ( Sokolow-Lyon ) Nonspecific T wave abnormality Prolonged QT Abnormal ECG When compared with ECG of 09-Sep-2021 00:42, PREVIOUS ECG IS PRESENT Confirmed by Gerlene Fee [phone]) on 10/07/2021 1:09:51 AM  I have reviewed the labs performed to date as well as medications administered while in observation.  Recent changes in the last 24 hours include patient was cleared by psychiatry yesterday..  Plan  Current plan is for discharge.  Michael [name] is not under involuntary commitment.

## 2021-10-08 NOTE — ED Triage Notes (Signed)
Pt BIB EMS from side of street. Pt asked a store to call EMS for him. Pt c/o chest pain. Pt had CABG done 1 year ago. Pt states he has had chest pain off and on since, pt states pain is worse when inhaling. Pt seen at [facility] ED for same complaint and discharged. EMS reports EKG is sinus rhythm. Pt ambulated in room.

## 2021-10-08 NOTE — ED Notes (Signed)
Patient given bus pass.

## 2021-10-08 NOTE — ED Notes (Signed)
Breakfast Orders Placed °

## 2021-10-08 NOTE — Progress Notes (Signed)
Shelter resource attached to pt's AVS. Pt is on [employer]. No shelter available tonight. Pt can receive bus pass upon d/c.   Arlie Solomons.Abas Leicht, MSW, [place]   Transitions of Care Clinical Social Worker I Direct Dial: [phone]   Fax: [phone] [email or web address]

## 2021-10-09 ENCOUNTER — Emergency Department (HOSPITAL_COMMUNITY)
Admission: EM | Admit: 2021-10-09 | Discharge: 2021-10-10 | Disposition: A | Discharge status: Home / Self Care | Payer: Medicare Other | Attending: Emergency Medicine | Admitting: Emergency Medicine

## 2021-10-09 ENCOUNTER — Other Ambulatory Visit: Payer: Self-pay

## 2021-10-09 ENCOUNTER — Emergency Department (HOSPITAL_COMMUNITY): Payer: Medicare Other

## 2021-10-09 ENCOUNTER — Encounter (HOSPITAL_COMMUNITY): Payer: Self-pay

## 2021-10-09 DIAGNOSIS — R072 Precordial pain: Secondary | ICD-10-CM | POA: Insufficient documentation

## 2021-10-09 DIAGNOSIS — Z79899 Other long term (current) drug therapy: Secondary | ICD-10-CM | POA: Diagnosis not present

## 2021-10-09 DIAGNOSIS — R45851 Suicidal ideations: Secondary | ICD-10-CM | POA: Diagnosis not present

## 2021-10-09 DIAGNOSIS — Z7982 Long term (current) use of aspirin: Secondary | ICD-10-CM | POA: Insufficient documentation

## 2021-10-09 DIAGNOSIS — I10 Essential (primary) hypertension: Secondary | ICD-10-CM | POA: Insufficient documentation

## 2021-10-09 DIAGNOSIS — E119 Type 2 diabetes mellitus without complications: Secondary | ICD-10-CM | POA: Insufficient documentation

## 2021-10-09 DIAGNOSIS — I2510 Atherosclerotic heart disease of native coronary artery without angina pectoris: Secondary | ICD-10-CM | POA: Insufficient documentation

## 2021-10-09 DIAGNOSIS — Y900 Blood alcohol level of less than 20 mg/100 ml: Secondary | ICD-10-CM | POA: Insufficient documentation

## 2021-10-09 DIAGNOSIS — Z794 Long term (current) use of insulin: Secondary | ICD-10-CM | POA: Insufficient documentation

## 2021-10-09 DIAGNOSIS — R0602 Shortness of breath: Secondary | ICD-10-CM | POA: Insufficient documentation

## 2021-10-09 LAB — RAPID URINE DRUG SCREEN, HOSP PERFORMED
Amphetamines: NOT DETECTED
Barbiturates: NOT DETECTED
Benzodiazepines: NOT DETECTED
Cocaine: POSITIVE — AB
Opiates: NOT DETECTED
Tetrahydrocannabinol: NOT DETECTED

## 2021-10-09 NOTE — ED Provider Notes (Signed)
°  Face-to-face evaluation   History: He presents for evaluation of chest pain, on and off for a year.  He states he is homeless and has no place to go so [place]  He states he has tried to get into the shelter but "did not have any beds."  States he has tried to commit suicide in the past and has thought about suicide constantly.  Physical exam: Healthy robust appearing elderly male, who is calm and cooperative.  Heart regular rate and rhythm without murmur.  Lungs clear without rales or wheezes.  MDM: Evaluation for  Chief Complaint  Patient presents with   Chest Pain   Suicidal     Patient presenting with ongoing pain for a year, and suicidality which is best characterized as rumination.  He is homeless.  He had psychiatric evaluation yesterday along with a very comprehensive evaluation to rule out acute medical problems.  Patient appears stable for discharge.  We will check screening labs before discharge.  Medical screening examination/treatment/procedure(s) were conducted as a shared visit with non-physician practitioner(s) and myself.  I personally evaluated the patient during the encounter    Daleen Bo, MD 10/10/21 1136

## 2021-10-09 NOTE — ED Triage Notes (Signed)
Pt BIB EMS with complaints of chest pain and SI. Pt is homeless and reports having a cardiac history.

## 2021-10-09 NOTE — ED Notes (Signed)
Save blue and red tube in main lab

## 2021-10-09 NOTE — ED Provider Notes (Signed)
[facility] DEPT Provider Note   CSN: [id number] Arrival date & time: 10/09/21  2201     History  Chief Complaint  Patient presents with   Chest Pain   Suicidal    Michael [name] is a 71 y.o. male with a past medical history of CAD status post CABG, diabetes, hypertension, HIV.  Per chart review patient has been seen multiple times in the emergency department in November and December 2022 for chest pain and suicidal ideations.  Most recently patient was seen on 12/31 and 1/2.  Per chart review patient was psych cleared by behavioral health on 1/1.    Presents to the emergency department with a complaint of chest pain and suicidal ideations.  Patient reports that chest pain has been present for the last year.  Pain has been constant over this time.  Pain is located to the left side of his chest and radiates to to his sternum.  Patient describes pain as sharp.  Rates pain 5/10.  States that pain is worse with activity.  Denies any recent change in chest pain.  Patient endorses associated shortness of breath.  Patient reports that he has suicidal ideations in the last 8 to 9 months.  States that he has been "thinking about it more."  Patient denies any active plan for suicide at this time.  Patient reports that he attempted suicide in the past.  She does most recently attempted suicide 6 months prior.  States that on this attempt he tried to cut his wrist with a knife however the knife was "dull."  Patient endorses homicidal ideations however no active plan or active individual he was targeting.  Endorses visual and auditory hallucinations.  States that "I see [employer] green men maintain down hands walking rest of floor."  States that they "tell me I am no good."  Endorses crack cocaine use.  States last use crack cocaine 2 weeks prior.  Denies any EtOH use.  Patient reports that he is currently homeless and has tried contacting shelters in the area but was told there  was no room for him.  States that one of the reasons he is coming to the emergency department is due to homelessness.     Chest Pain Associated symptoms: shortness of breath   Associated symptoms: no abdominal pain, no back pain, no cough, no dizziness, no fever, no headache, no nausea, no palpitations and no vomiting       Home Medications Prior to Admission medications   Medication Sig Start Date End Date Taking? Authorizing Provider  albuterol (VENTOLIN HFA) 108 (90 Base) MCG/ACT inhaler Inhale 1-2 puffs into the lungs every 6 (six) hours as needed for wheezing or shortness of breath. 12/09/19   Eugenie Filler, MD  amLODipine (NORVASC) 10 MG tablet Take 1 tablet (10 mg total) by mouth daily. 04/05/21   Lacinda Axon, MD  aspirin EC 81 MG tablet Take 81 mg by mouth daily. Swallow whole.    [provider]  bictegravir-emtricitabine-tenofovir AF (BIKTARVY) 50-200-25 MG TABS tablet Take 1 tablet by mouth daily. 04/25/21   Mignon Pine, DO  budesonide-formoterol (SYMBICORT) 160-4.5 MCG/ACT inhaler Inhale 2 puffs into the lungs in the morning and at bedtime. 04/22/21   [provider]  buPROPion (WELLBUTRIN XL) 150 MG 24 hr tablet Take 150 mg by mouth every morning. 04/22/21   [provider]  diclofenac Sodium (VOLTAREN) 1 % GEL Apply 2 g topically 2 (two) times daily as  needed (for pain in shoulders, neck, and knees).    [provider]  insulin glargine (LANTUS) 100 UNIT/ML Solostar Pen Inject 30 Units into the skin daily. Patient taking differently: Inject 20 Units into the skin at bedtime. 12/09/19   Eugenie Filler, MD  insulin lispro (HUMALOG KWIKPEN) 100 UNIT/ML KwikPen Inject 0.04 mLs (4 Units total) into the skin 3 (three) times daily with meals. 12/09/19   Eugenie Filler, MD  Insulin Pen Needle 31G X 5 MM MISC 4 Units by Does not apply route in the morning, at noon, and at bedtime. 12/09/19   Eugenie Filler, MD  isosorbide  mononitrate (IMDUR) 60 MG 24 hr tablet Take 2 tablets (120 mg total) by mouth daily. 04/04/21 10/01/21  Lacinda Axon, MD  lidocaine (LIDODERM) 5 % Place 1 patch onto the skin daily as needed (For chestwall pain). Remove & Discard patch within 12 hours or as directed by MD 04/04/21   Lacinda Axon, MD  mirtazapine (REMERON) 15 MG tablet Take 15 mg by mouth at bedtime as needed (sleep). 04/18/21   [provider]  nitroGLYCERIN (NITROSTAT) 0.4 MG SL tablet Place 1 tablet (0.4 mg total) under the tongue every 5 (five) minutes as needed for chest pain. 12/09/19   Eugenie Filler, MD  pantoprazole (PROTONIX) 40 MG tablet Take 40 mg by mouth daily as needed (for heartburn).    [provider]  traZODone (DESYREL) 50 MG tablet Take 50 mg by mouth at bedtime as needed for sleep. 06/28/21   [provider]  XARELTO 15 MG TABS tablet Take 15 mg by mouth daily. 04/22/21   [provider]      Allergies    Patient has no known allergies.    Review of Systems   Review of Systems  Constitutional:  Negative for chills and fever.  Eyes:  Negative for visual disturbance.  Respiratory:  Positive for shortness of breath. Negative for cough.   Cardiovascular:  Positive for chest pain. Negative for palpitations and leg swelling.  Gastrointestinal:  Negative for abdominal pain, nausea and vomiting.  Genitourinary:  Negative for difficulty urinating and dysuria.  Musculoskeletal:  Negative for back pain and neck pain.  Skin:  Negative for color change and rash.  Neurological:  Negative for dizziness, syncope, light-headedness and headaches.  Psychiatric/Behavioral:  Positive for hallucinations and suicidal ideas. Negative for confusion.    Physical Exam Updated Vital Signs There were no vitals taken for this visit. Physical Exam Vitals and nursing note reviewed.  Constitutional:      General: He is not in acute distress.    Appearance: He is not ill-appearing,  toxic-appearing or diaphoretic.  HENT:     Head: Normocephalic.  Eyes:     General: No scleral icterus.       Right eye: No discharge.        Left eye: No discharge.  Cardiovascular:     Rate and Rhythm: Normal rate.     Pulses:          Radial pulses are 2+ on the right side and 2+ on the left side.  Pulmonary:     Effort: Pulmonary effort is normal. No tachypnea, bradypnea or respiratory distress.     Breath sounds: Normal breath sounds.     Comments: Minimal expiratory wheezing noted to bilateral lower lobes. Skin:    General: Skin is warm and dry.     Comments: No scars or wounds noted to bilateral  upper extremities.  Neurological:     General: No focal deficit present.     Mental Status: He is alert.  Psychiatric:        Attention and Perception: He is attentive. He perceives auditory and visual hallucinations.        Mood and Affect: Mood normal.        Behavior: Behavior is cooperative.        Thought Content: Thought content includes homicidal and suicidal ideation. Thought content does not include homicidal or suicidal plan.    ED Results / Procedures / Treatments   Labs (all labs ordered are listed, but only abnormal results are displayed) Labs Reviewed  CBC WITH DIFFERENTIAL/PLATELET - Abnormal; Notable for the following components:      Result Value   WBC 3.8 (*)    RBC 3.76 (*)    Hemoglobin 10.7 (*)    HCT 33.1 (*)    All other components within normal limits  COMPREHENSIVE METABOLIC PANEL - Abnormal; Notable for the following components:   Glucose, Bld 196 (*)    BUN 32 (*)    Creatinine, Ser 2.30 (*)    GFR, Estimated 30 (*)    All other components within normal limits  ACETAMINOPHEN LEVEL - Abnormal; Notable for the following components:   Acetaminophen (Tylenol), Serum <10 (*)    All other components within normal limits  SALICYLATE LEVEL - Abnormal; Notable for the following components:   Salicylate Lvl <5.0 (*)    All other components within  normal limits  RAPID URINE DRUG SCREEN, HOSP PERFORMED - Abnormal; Notable for the following components:   Cocaine POSITIVE (*)    All other components within normal limits  ETHANOL  TROPONIN I (HIGH SENSITIVITY)  TROPONIN I (HIGH SENSITIVITY)    EKG None  Radiology CT ABDOMEN PELVIS WO CONTRAST  Result Date: 10/08/2021 CLINICAL DATA:  Abdominal pain. EXAM: CT ABDOMEN AND PELVIS WITHOUT CONTRAST TECHNIQUE: Multidetector CT imaging of the abdomen and pelvis was performed following the standard protocol without IV contrast. COMPARISON:  September 02, 2018 FINDINGS: Lower chest: Mild to moderate severity right middle lobe atelectasis and/or infiltrate. Mild posterolateral right lower lobe focal atelectasis with mild posteromedial right lower lobe scarring. Hepatobiliary: No focal liver abnormality is seen. No gallstones, gallbladder wall thickening, or biliary dilatation. Pancreas: Unremarkable. No pancreatic ductal dilatation or surrounding inflammatory changes. Spleen: Normal in size without focal abnormality. Adrenals/Urinary Tract: Adrenal glands are unremarkable. Kidneys are normal, without renal calculi, focal lesion, or hydronephrosis. The urinary bladder is moderately distended. Stomach/Bowel: Stomach is within normal limits. The appendix is poorly visualized. Stool is seen throughout the large bowel. No evidence of bowel wall thickening, distention, or inflammatory changes. Vascular/Lymphatic: Aortic atherosclerosis. No enlarged abdominal or pelvic lymph nodes. Reproductive: Prostate is unremarkable. Other: No abdominal wall hernia or abnormality. No abdominopelvic ascites. Musculoskeletal: Approximately 4 mm anterolisthesis of the L4 vertebral body is noted on L5. Postoperative changes with metallic density hardware is seen along the posterior elements of the L5-S1 level. Multilevel degenerative changes are noted throughout the lumbar spine. IMPRESSION: 1. Mild to moderate severity right middle  lobe atelectasis and/or infiltrate. 2. No evidence of an acute or active process within the abdomen or pelvis. 3. Postoperative and degenerative changes within the lumbar spine, as described above. 4. Aortic atherosclerosis. Aortic Atherosclerosis (ICD10-I70.0). Electronically Signed   By: Virgina Norfolk M.D.   On: 10/08/2021 21:09    Procedures Procedures    Medications Ordered in ED Medications -  No data to display  ED Course/ Medical Decision Making/ A&P                           Medical Decision Making  Alert 71 year old male no acute stress, nontoxic-appearing.  Presents to emergency department with complaint of chest pain and suicidal ideations.  Per chart review patient has been seen multiple times in the emergency department in November and December 2022 for chest pain and suicidal ideations.  Most recently patient was seen on 12/31 and 1/2.  Per chart review patient was psych cleared by behavioral health on 1/1.  Patient had mildly elevated troponins on 12/31 which remained flat and 50s.  Troponin yesterday obtained at 21.  Patient was also seen by [facility] nurse given resources for shelters in the area.  Patient reports that he is currently homeless and has tried contacting shelters in the area but was told there was no room for him.  States that one of the reasons he is coming to the emergency department is due to homelessness.  Chest x-ray shows no active cardiopulmonary disease Troponin within normal limits Chest pain has been present x1 year, with no reports of change in symptoms.  Low suspicion for ACS at this time.  CBC shows leukopenia and anemia which appears baseline for patient. CMP shows increased BUN/creatinine, appear baseline for patient. UDS positive for cocaine.  Patient endorses using crack cocaine 2 weeks prior. Salicylate level and acetaminophen level within normal limits.  Suicidal ideations ongoing x6 to 8 months.  No plan of suicide at this time.  Patient  was evaluated recently by [facility] and psych cleared.  We will give patient resources to follow-up with psychiatry in the outpatient setting.  Patient given information for behavioral health urgent care.  Discussed results, findings, treatment and follow up. Patient advised of return precautions. Patient verbalized understanding and agreed with plan.  Patient was discussed with and evaluated by Dr. Eulis Foster.         Final Clinical Impression(s) / ED Diagnoses Final diagnoses:  Precordial chest pain  Suicidal ideation    Rx / DC Orders ED Discharge Orders     None         Loni Beckwith, PA-C 10/10/21 [id number]    Daleen Bo, MD 10/10/21 1136

## 2021-10-10 LAB — CBC WITH DIFFERENTIAL/PLATELET
Abs Immature Granulocytes: 0.02 K/uL (ref 0.00–0.07)
Basophils Absolute: 0 K/uL (ref 0.0–0.1)
Basophils Relative: 1 %
Eosinophils Absolute: 0.1 K/uL (ref 0.0–0.5)
Eosinophils Relative: 2 %
HCT: 33.1 % — ABNORMAL LOW (ref 39.0–52.0)
Hemoglobin: 10.7 g/dL — ABNORMAL LOW (ref 13.0–17.0)
Immature Granulocytes: 1 %
Lymphocytes Relative: 27 %
Lymphs Abs: 1 K/uL (ref 0.7–4.0)
MCH: 28.5 pg (ref 26.0–34.0)
MCHC: 32.3 g/dL (ref 30.0–36.0)
MCV: 88 fL (ref 80.0–100.0)
Monocytes Absolute: 0.3 K/uL (ref 0.1–1.0)
Monocytes Relative: 8 %
Neutro Abs: 2.4 K/uL (ref 1.7–7.7)
Neutrophils Relative %: 61 %
Platelets: 192 K/uL (ref 150–400)
RBC: 3.76 MIL/uL — ABNORMAL LOW (ref 4.22–5.81)
RDW: 12.8 % (ref 11.5–15.5)
WBC: 3.8 K/uL — ABNORMAL LOW (ref 4.0–10.5)
nRBC: 0 % (ref 0.0–0.2)

## 2021-10-10 LAB — ETHANOL: Alcohol, Ethyl (B): <10 mg/dL (ref <10)

## 2021-10-10 LAB — COMPREHENSIVE METABOLIC PANEL
ALT: 17 U/L (ref 0–44)
AST: 19 U/L (ref 15–41)
Albumin: 4 g/dL (ref 3.5–5.0)
Alkaline Phosphatase: 80 U/L (ref 38–126)
Anion gap: 8 (ref 5–15)
BUN: 32 mg/dL — ABNORMAL HIGH (ref 8–23)
CO2: 23 mmol/L (ref 22–32)
Calcium: 9 mg/dL (ref 8.9–10.3)
Chloride: 108 mmol/L (ref 98–111)
Creatinine, Ser: 2.3 mg/dL — ABNORMAL HIGH (ref 0.61–1.24)
GFR, Estimated: 30 mL/min — ABNORMAL LOW (ref >60)
Glucose, Bld: 196 mg/dL — ABNORMAL HIGH (ref 70–99)
Potassium: 4.3 mmol/L (ref 3.5–5.1)
Sodium: 139 mmol/L (ref 135–145)
Total Bilirubin: 0.4 mg/dL (ref 0.3–1.2)
Total Protein: 7.6 g/dL (ref 6.5–8.1)

## 2021-10-10 LAB — TROPONIN I (HIGH SENSITIVITY): Troponin I (High Sensitivity): 16 ng/L (ref <18)

## 2021-10-10 LAB — ACETAMINOPHEN LEVEL: Acetaminophen (Tylenol), Serum: <10 ug/mL — ABNORMAL LOW (ref 10–30)

## 2021-10-10 LAB — SALICYLATE LEVEL: Salicylate Lvl: <7 mg/dL — ABNORMAL LOW (ref 7.0–30.0)

## 2021-10-10 NOTE — Discharge Instructions (Addendum)
You came to the emergency department today to be evaluated for your chest pain and suicidal ideations.  You have recently been evaluated by the psychiatric team and determined not to need inpatient care at this time.  Please follow-up with the behavioral health urgent care as needed for further suicidal thoughts.  I have given you paperwork with resources to follow-up with in the outpatient setting for continued mental health care.  Your lab work was reassuring you are not having acute heart attack today.  Please follow-up with the [facility] and wellness clinic listed on this paperwork for further management of your chronic medical conditions.  Get help right away if: Your chest pain gets worse. You have a cough that gets worse, or you cough up blood. You have severe pain in your abdomen. You faint. You have sudden, unexplained chest discomfort. You have sudden, unexplained discomfort in your arms, back, neck, or jaw. You have shortness of breath at any time. You suddenly start to sweat, or your skin gets clammy. You feel nausea or you vomit. You suddenly feel lightheaded or dizzy. You have severe weakness, or unexplained weakness or fatigue. Your heart begins to beat quickly, or it feels like it is skipping beats.

## 2021-10-11 ENCOUNTER — Emergency Department (HOSPITAL_COMMUNITY)
Admission: EM | Admit: 2021-10-11 | Discharge: 2021-10-12 | Disposition: A | Discharge status: Home / Self Care | Payer: Medicare Other | Attending: Emergency Medicine | Admitting: Emergency Medicine

## 2021-10-11 ENCOUNTER — Encounter (HOSPITAL_COMMUNITY): Payer: Self-pay

## 2021-10-11 ENCOUNTER — Other Ambulatory Visit: Payer: Self-pay

## 2021-10-11 DIAGNOSIS — Z794 Long term (current) use of insulin: Secondary | ICD-10-CM | POA: Insufficient documentation

## 2021-10-11 DIAGNOSIS — R0789 Other chest pain: Secondary | ICD-10-CM | POA: Diagnosis present

## 2021-10-11 DIAGNOSIS — Z7901 Long term (current) use of anticoagulants: Secondary | ICD-10-CM | POA: Diagnosis not present

## 2021-10-11 DIAGNOSIS — F19239 Other psychoactive substance dependence with withdrawal, unspecified: Secondary | ICD-10-CM | POA: Diagnosis present

## 2021-10-11 DIAGNOSIS — Z79899 Other long term (current) drug therapy: Secondary | ICD-10-CM | POA: Insufficient documentation

## 2021-10-11 DIAGNOSIS — R45851 Suicidal ideations: Secondary | ICD-10-CM | POA: Insufficient documentation

## 2021-10-11 DIAGNOSIS — Z7982 Long term (current) use of aspirin: Secondary | ICD-10-CM | POA: Insufficient documentation

## 2021-10-11 DIAGNOSIS — Z951 Presence of aortocoronary bypass graft: Secondary | ICD-10-CM | POA: Insufficient documentation

## 2021-10-11 DIAGNOSIS — G8929 Other chronic pain: Secondary | ICD-10-CM | POA: Insufficient documentation

## 2021-10-11 DIAGNOSIS — F1924 Other psychoactive substance dependence with psychoactive substance-induced mood disorder: Secondary | ICD-10-CM | POA: Diagnosis present

## 2021-10-11 LAB — CBC WITH DIFFERENTIAL/PLATELET
Abs Immature Granulocytes: 0.01 10*3/uL (ref 0.00–0.07)
Basophils Absolute: 0 10*3/uL (ref 0.0–0.1)
Basophils Relative: 1 %
Eosinophils Absolute: 0.1 10*3/uL (ref 0.0–0.5)
Eosinophils Relative: 3 %
HCT: 36.2 % — ABNORMAL LOW (ref 39.0–52.0)
Hemoglobin: 11.4 g/dL — ABNORMAL LOW (ref 13.0–17.0)
Immature Granulocytes: 0 %
Lymphocytes Relative: 36 %
Lymphs Abs: 1.3 10*3/uL (ref 0.7–4.0)
MCH: 27.8 pg (ref 26.0–34.0)
MCHC: 31.5 g/dL (ref 30.0–36.0)
MCV: 88.3 fL (ref 80.0–100.0)
Monocytes Absolute: 0.5 10*3/uL (ref 0.1–1.0)
Monocytes Relative: 14 %
Neutro Abs: 1.7 10*3/uL (ref 1.7–7.7)
Neutrophils Relative %: 46 %
Platelets: 173 10*3/uL (ref 150–400)
RBC: 4.1 MIL/uL — ABNORMAL LOW (ref 4.22–5.81)
RDW: 12.9 % (ref 11.5–15.5)
WBC: 3.7 10*3/uL — ABNORMAL LOW (ref 4.0–10.5)
nRBC: 0 % (ref 0.0–0.2)

## 2021-10-11 LAB — COMPREHENSIVE METABOLIC PANEL
ALT: 17 U/L (ref 0–44)
AST: 22 U/L (ref 15–41)
Albumin: 4.1 g/dL (ref 3.5–5.0)
Alkaline Phosphatase: 79 U/L (ref 38–126)
Anion gap: 5 (ref 5–15)
BUN: 33 mg/dL — ABNORMAL HIGH (ref 8–23)
CO2: 21 mmol/L — ABNORMAL LOW (ref 22–32)
Calcium: 8.8 mg/dL — ABNORMAL LOW (ref 8.9–10.3)
Chloride: 110 mmol/L (ref 98–111)
Creatinine, Ser: 2.22 mg/dL — ABNORMAL HIGH (ref 0.61–1.24)
GFR, Estimated: 31 mL/min — ABNORMAL LOW (ref >60)
Glucose, Bld: 270 mg/dL — ABNORMAL HIGH (ref 70–99)
Potassium: 4.2 mmol/L (ref 3.5–5.1)
Sodium: 136 mmol/L (ref 135–145)
Total Bilirubin: 0.7 mg/dL (ref 0.3–1.2)
Total Protein: 7.4 g/dL (ref 6.5–8.1)

## 2021-10-11 LAB — TROPONIN I (HIGH SENSITIVITY): Troponin I (High Sensitivity): 18 ng/L — ABNORMAL HIGH (ref <18)

## 2021-10-11 MED ORDER — BUPROPION HCL ER (XL) 150 MG PO TB24
150.0000 mg | ORAL_TABLET | Freq: Every morning | ORAL | Status: DC
  Administered 2021-10-12: 150 mg via ORAL
  Filled 2021-10-11: qty 1

## 2021-10-11 MED ORDER — AMLODIPINE BESYLATE 5 MG PO TABS
10.0000 mg | ORAL_TABLET | Freq: Every day | ORAL | Status: DC
  Administered 2021-10-12: 10 mg via ORAL
  Filled 2021-10-11: qty 2

## 2021-10-11 MED ORDER — ALBUTEROL SULFATE HFA 108 (90 BASE) MCG/ACT IN AERS: 1.0000 | INHALATION_SPRAY | Freq: Four times a day (QID) | RESPIRATORY_TRACT | Status: DC | PRN

## 2021-10-11 MED ORDER — MOMETASONE FURO-FORMOTEROL FUM 200-5 MCG/ACT IN AERO
2.0000 | INHALATION_SPRAY | Freq: Two times a day (BID) | RESPIRATORY_TRACT | Status: DC
  Administered 2021-10-12: 2 via RESPIRATORY_TRACT
  Filled 2021-10-11: qty 8.8

## 2021-10-11 MED ORDER — MIRTAZAPINE 7.5 MG PO TABS: 15.0000 mg | ORAL_TABLET | Freq: Every evening | ORAL | Status: DC | PRN

## 2021-10-11 MED ORDER — NITROGLYCERIN 0.4 MG SL SUBL: 0.4000 mg | SUBLINGUAL_TABLET | SUBLINGUAL | Status: DC | PRN

## 2021-10-11 MED ORDER — ASPIRIN EC 81 MG PO TBEC
81.0000 mg | DELAYED_RELEASE_TABLET | Freq: Every day | ORAL | Status: DC
  Administered 2021-10-12: 81 mg via ORAL
  Filled 2021-10-11: qty 1

## 2021-10-11 MED ORDER — ISOSORBIDE MONONITRATE ER 60 MG PO TB24
120.0000 mg | ORAL_TABLET | Freq: Every day | ORAL | Status: DC
  Administered 2021-10-12: 120 mg via ORAL
  Filled 2021-10-11: qty 2

## 2021-10-11 MED ORDER — DICLOFENAC SODIUM 1 % EX GEL
2.0000 g | Freq: Two times a day (BID) | CUTANEOUS | Status: DC | PRN
  Filled 2021-10-11: qty 100

## 2021-10-11 MED ORDER — LIDOCAINE 5 % EX PTCH: 1.0000 | MEDICATED_PATCH | Freq: Every day | CUTANEOUS | Status: DC | PRN

## 2021-10-11 MED ORDER — PANTOPRAZOLE SODIUM 40 MG PO TBEC: 40.0000 mg | DELAYED_RELEASE_TABLET | Freq: Every day | ORAL | Status: DC | PRN

## 2021-10-11 MED ORDER — INSULIN LISPRO (1 UNIT DIAL) 100 UNIT/ML (KWIKPEN): 4.0000 [IU] | PEN_INJECTOR | Freq: Three times a day (TID) | SUBCUTANEOUS | Status: DC

## 2021-10-11 MED ORDER — TRAZODONE HCL 100 MG PO TABS: 50.0000 mg | ORAL_TABLET | Freq: Every evening | ORAL | Status: DC | PRN

## 2021-10-11 MED ORDER — BICTEGRAVIR-EMTRICITAB-TENOFOV 50-200-25 MG PO TABS
1.0000 | ORAL_TABLET | Freq: Every day | ORAL | Status: DC
  Administered 2021-10-12: 1 via ORAL
  Filled 2021-10-11: qty 1

## 2021-10-11 MED ORDER — RIVAROXABAN 15 MG PO TABS
15.0000 mg | ORAL_TABLET | Freq: Every day | ORAL | Status: DC
  Administered 2021-10-12: 15 mg via ORAL
  Filled 2021-10-11: qty 1

## 2021-10-11 NOTE — BH Assessment (Signed)
Clinician messaged Kathlen Mody, RN: "Hey. It's Trey with TTS. Is the pt able to be assessed? Also are they under IVC?"   Clinician awaiting response.     Vertell Novak, [place], [facility], [facility] Triage Specialist [phone]

## 2021-10-11 NOTE — ED Provider Triage Note (Signed)
Emergency Medicine Provider Triage Evaluation Note  Michael [name] , a 71 y.o. male  was evaluated in triage.  Pt complains of chest pain and suicidal ideations.  This been going on for a while.  He is also having homicidal ideations, auditory hallucinations, and visual hallucinations.  Review of Systems  Positive:  Negative: See above   Physical Exam  BP (!) 160/82 (BP Location: Left Arm)    Pulse 86    Temp 98.4 F (36.9 C) (Oral)    Resp 16    Ht 5\' 8"  (1.727 m)    Wt 73.9 kg    SpO2 100%    BMI 24.78 kg/m  Gen:   Awake, no distress   Resp:  Normal effort  MSK:   Moves extremities without difficulty  Other:    Medical Decision Making  Medically screening exam initiated at 9:06 PM.  Appropriate orders placed.  Michael [name] was informed that the remainder of the evaluation will be completed by another provider, this initial triage assessment does not replace that evaluation, and the importance of remaining in the ED until their evaluation is complete.  Given that he was just here 2 days ago did not feel that medical screening labs necessary at this time.  Patient is still having chest pain does have a history of cocaine use.  We will get basic labs and troponin level.  Patient will be placed in the psychiatric hold for evaluation.  TTS consulted.   Michael [name], [place] 10/11/21 2108

## 2021-10-11 NOTE — ED Notes (Addendum)
Attempted lab draw x 2 but unsuccessful, RN made aware. 

## 2021-10-11 NOTE — ED Notes (Signed)
Pt has had 5 unsuccessful sticks for labs, triage nurse aware.

## 2021-10-11 NOTE — ED Provider Notes (Signed)
Nursing notes and vitals signs, including pulse oximetry, reviewed.  Summary of this visit's results, reviewed by myself:  EKG:  EKG Interpretation  Date/Time:  Thursday October 11 2021 20:39:11 EST Ventricular Rate:  86 PR Interval:  150 QRS Duration: 72 QT Interval:  380 QTC Calculation: 455 R Axis:   69 Text Interpretation: Sinus rhythm Atrial premature complex Consider left ventricular hypertrophy Nonspecific T abnormalities, lateral leads similar to Oct 08 2021 Confirmed by Sherwood Gambler [phone]) on 10/11/2021 10:08:04 PM        Labs:  Results for orders placed or performed during the hospital encounter of 10/11/21 (from the past 24 hour(s))  Comprehensive metabolic panel     Status: Abnormal   Collection Time: 10/11/21 11:03 PM  Result Value Ref Range   Sodium 136 135 - 145 mmol/L   Potassium 4.2 3.5 - 5.1 mmol/L   Chloride 110 98 - 111 mmol/L   CO2 21 (L) 22 - 32 mmol/L   Glucose, Bld 270 (H) 70 - 99 mg/dL   BUN 33 (H) 8 - 23 mg/dL   Creatinine, Ser 2.22 (H) 0.61 - 1.24 mg/dL   Calcium 8.8 (L) 8.9 - 10.3 mg/dL   Total Protein 7.4 6.5 - 8.1 g/dL   Albumin 4.1 3.5 - 5.0 g/dL   AST 22 15 - 41 U/L   ALT 17 0 - 44 U/L   Alkaline Phosphatase 79 38 - 126 U/L   Total Bilirubin 0.7 0.3 - 1.2 mg/dL   GFR, Estimated 31 (L) >60 mL/min   Anion gap 5 5 - 15  CBC with Differential     Status: Abnormal   Collection Time: 10/11/21 11:03 PM  Result Value Ref Range   WBC 3.7 (L) 4.0 - 10.5 K/uL   RBC 4.10 (L) 4.22 - 5.81 MIL/uL   Hemoglobin 11.4 (L) 13.0 - 17.0 g/dL   HCT 36.2 (L) 39.0 - 52.0 %   MCV 88.3 80.0 - 100.0 fL   MCH 27.8 26.0 - 34.0 pg   MCHC 31.5 30.0 - 36.0 g/dL   RDW 12.9 11.5 - 15.5 %   Platelets 173 150 - 400 K/uL   nRBC 0.0 0.0 - 0.2 %   Neutrophils Relative % 46 %   Neutro Abs 1.7 1.7 - 7.7 K/uL   Lymphocytes Relative 36 %   Lymphs Abs 1.3 0.7 - 4.0 K/uL   Monocytes Relative 14 %   Monocytes Absolute 0.5 0.1 - 1.0 K/uL   Eosinophils Relative 3 %    Eosinophils Absolute 0.1 0.0 - 0.5 K/uL   Basophils Relative 1 %   Basophils Absolute 0.0 0.0 - 0.1 K/uL   Immature Granulocytes 0 %   Abs Immature Granulocytes 0.01 0.00 - 0.07 K/uL  Troponin I (High Sensitivity)     Status: Abnormal   Collection Time: 10/11/21 11:03 PM  Result Value Ref Range   Troponin I (High Sensitivity) 18 (H) <18 ng/L   Patient is troponin is slightly elevated as is his norm.  I do not believe this represents a new acute coronary syndrome.  His creatinine is elevated which is also within his usual limits.  I believe he is clear for psychiatric evaluation.    Joby Richart, Jenny Reichmann, MD 10/11/21 [phone]

## 2021-10-11 NOTE — ED Provider Notes (Signed)
[facility] DEPT Provider Note   CSN: [id number] Arrival date & time: 10/11/21  2023     History  Chief Complaint  Patient presents with   Suicidal   Chest Pain    Michael [name] is a 71 y.o. male.  HPI 71 year old male presents with suicidal thoughts and chest pain.  He is very vague on his description.  Seems like the chest pain has been since he had his CABG over a year ago.  The suicidal thoughts has been for longer though things seem to be worse over the last 6 months.  He tells me he has a plan but then will not elicit what it is.  He states his legs are swollen chronically.  He has some chronic abdominal pain as well.  He feels like all of his symptoms including his depression are slowly worsening.  Home Medications Prior to Admission medications   Medication Sig Start Date End Date Taking? Authorizing Provider  albuterol (VENTOLIN HFA) 108 (90 Base) MCG/ACT inhaler Inhale 1-2 puffs into the lungs every 6 (six) hours as needed for wheezing or shortness of breath. 12/09/19   Eugenie Filler, MD  amLODipine (NORVASC) 10 MG tablet Take 1 tablet (10 mg total) by mouth daily. 04/05/21   Lacinda Axon, MD  aspirin EC 81 MG tablet Take 81 mg by mouth daily. Swallow whole.    [provider]  bictegravir-emtricitabine-tenofovir AF (BIKTARVY) 50-200-25 MG TABS tablet Take 1 tablet by mouth daily. 04/25/21   Mignon Pine, DO  budesonide-formoterol (SYMBICORT) 160-4.5 MCG/ACT inhaler Inhale 2 puffs into the lungs in the morning and at bedtime. 04/22/21   [provider]  buPROPion (WELLBUTRIN XL) 150 MG 24 hr tablet Take 150 mg by mouth every morning. 04/22/21   [provider]  diclofenac Sodium (VOLTAREN) 1 % GEL Apply 2 g topically 2 (two) times daily as needed (for pain in shoulders, neck, and knees).    [provider]  insulin glargine (LANTUS) 100 UNIT/ML Solostar Pen Inject 30 Units into the skin  daily. Patient taking differently: Inject 20 Units into the skin at bedtime. 12/09/19   Eugenie Filler, MD  insulin lispro (HUMALOG KWIKPEN) 100 UNIT/ML KwikPen Inject 0.04 mLs (4 Units total) into the skin 3 (three) times daily with meals. 12/09/19   Eugenie Filler, MD  Insulin Pen Needle 31G X 5 MM MISC 4 Units by Does not apply route in the morning, at noon, and at bedtime. 12/09/19   Eugenie Filler, MD  isosorbide mononitrate (IMDUR) 60 MG 24 hr tablet Take 2 tablets (120 mg total) by mouth daily. 04/04/21 10/01/21  Lacinda Axon, MD  lidocaine (LIDODERM) 5 % Place 1 patch onto the skin daily as needed (For chestwall pain). Remove & Discard patch within 12 hours or as directed by MD 04/04/21   Lacinda Axon, MD  mirtazapine (REMERON) 15 MG tablet Take 15 mg by mouth at bedtime as needed (sleep). 04/18/21   [provider]  nitroGLYCERIN (NITROSTAT) 0.4 MG SL tablet Place 1 tablet (0.4 mg total) under the tongue every 5 (five) minutes as needed for chest pain. 12/09/19   Eugenie Filler, MD  pantoprazole (PROTONIX) 40 MG tablet Take 40 mg by mouth daily as needed (for heartburn).    [provider]  traZODone (DESYREL) 50 MG tablet Take 50 mg by mouth at bedtime as needed for sleep. 06/28/21   [provider]  XARELTO 15 MG  TABS tablet Take 15 mg by mouth daily. 04/22/21   [provider]      Allergies    Patient has no known allergies.    Review of Systems   Review of Systems  Constitutional:  Negative for fever.  Respiratory:  Positive for cough and shortness of breath.   Cardiovascular:  Positive for chest pain and leg swelling.  Gastrointestinal:  Positive for abdominal pain.  Psychiatric/Behavioral:  Positive for suicidal ideas.   All other systems reviewed and are negative.  Physical Exam Updated Vital Signs BP (!) 160/82 (BP Location: Left Arm)    Pulse 86    Temp 98.4 F (36.9 C) (Oral)    Resp 16    Ht 5\' 8"  (1.727 m)    Wt  73.9 kg    SpO2 100%    BMI 24.78 kg/m  Physical Exam Vitals and nursing note reviewed.  Constitutional:      General: He is not in acute distress.    Appearance: He is well-developed. He is not ill-appearing or diaphoretic.  HENT:     Head: Normocephalic and atraumatic.  Cardiovascular:     Rate and Rhythm: Normal rate and regular rhythm.     Heart sounds: Normal heart sounds.  Pulmonary:     Effort: Pulmonary effort is normal.     Breath sounds: Normal breath sounds.  Abdominal:     Palpations: Abdomen is soft.     Tenderness: There is no abdominal tenderness.  Musculoskeletal:     Right lower leg: No edema.     Left lower leg: No edema.  Skin:    General: Skin is warm and dry.  Neurological:     Mental Status: He is alert.  Psychiatric:        Thought Content: Thought content includes suicidal ideation. Thought content does not include suicidal plan.    ED Results / Procedures / Treatments   Labs (all labs ordered are listed, but only abnormal results are displayed) Labs Reviewed  COMPREHENSIVE METABOLIC PANEL  CBC WITH DIFFERENTIAL/PLATELET  TROPONIN I (HIGH SENSITIVITY)  TROPONIN I (HIGH SENSITIVITY)    EKG EKG Interpretation  Date/Time:  Thursday October 11 2021 20:39:11 EST Ventricular Rate:  86 PR Interval:  150 QRS Duration: 72 QT Interval:  380 QTC Calculation: 455 R Axis:   69 Text Interpretation: Sinus rhythm Atrial premature complex Consider left ventricular hypertrophy Nonspecific T abnormalities, lateral leads similar to Oct 08 2021 Confirmed by Sherwood Gambler [phone]) on 10/11/2021 10:08:04 PM  Radiology DG Chest Portable 1 View  Result Date: 10/09/2021 CLINICAL DATA:  Chest pain. EXAM: PORTABLE CHEST 1 VIEW COMPARISON:  10/07/2021. FINDINGS: The heart size and mediastinal contours are within normal limits. There is evidence of prior cardiothoracic surgery. A left atrial appendage clip and coronary artery stent are noted. No consolidation, effusion,  or pneumothorax. No acute osseous abnormality. Cervical spinal fusion hardware is noted. IMPRESSION: No acute cardiopulmonary process Electronically Signed   By: Brett Fairy M.D.   On: 10/09/2021 23:41    Procedures Procedures    Medications Ordered in ED Medications - No data to display  ED Course/ Medical Decision Making/ A&P                           Medical Decision Making  My suspicion is that this is a chronic chest pain and chronic suicidal thoughts/depression.  His vitals are normal except for hypertension though it seems that  due to his homelessness he has not been taking his meds.  He will probably need a social work consult.  Will need to be cleared medically and psychiatrically first.  His ECG has been interpreted and shows no acute ischemia compared to baseline.  Labs are currently pending.  TTS consult ordered. Care to Dr. Florina Ou.         Final Clinical Impression(s) / ED Diagnoses Final diagnoses:  None    Rx / DC Orders ED Discharge Orders     None         Sherwood Gambler, MD 10/11/21 [phone]

## 2021-10-11 NOTE — ED Triage Notes (Signed)
[place]. Pt c/o SI. Pt also c/o chronic chest pain, pt CBG 350. Pt noncompliant with meds. Pt seen 1/3 here in [facility] ED for same.

## 2021-10-12 MED ORDER — INSULIN ASPART 100 UNIT/ML IJ SOLN
4.0000 [IU] | Freq: Three times a day (TID) | INTRAMUSCULAR | Status: DC
  Administered 2021-10-12 (×2): 4 [IU] via SUBCUTANEOUS
  Filled 2021-10-12: qty 0.04

## 2021-10-12 NOTE — Consult Note (Signed)
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 Clinician asked the pt, "what brought you to the hospital?" Pt reports, "everything." [place]. Pt reports, he got to the point where wanting to die stays on his mind all the time. Pt denies, having a plan or intent. Pt reports, seeing a guy in a 10 gallon hat walk across the floor and tell him he's no good. Pt reports, he's been in [place] for about a month after getting out of the hospital in [place]. Pt reports, he was in and out of the hospital in [place] for health reasons; his doctors are in [place] but he moved to [place] because he didn't care for [place]. Pt also denies, HI, self-injurious behaviors and access to weapons.   Subjective: Michael [name] is a 71 year old male with history of CAD, status post CABG, cocaine abuse, DM type 2, HTN, diabetic foot infection, anemia.  Patient is very well known to our facility, and others in the area as he frequently presents with similar presentation for chest pain secondary to substance use.  Patient is seen and reassessed this nurse practitioner.  He continues to endorse passive suicidal ideations, in the setting of chronic substance abuse.  He is wanting help for his substance use and depression.  Upon reassessment today patient appears to be resting in stretcher,continues to endorse ongoing cocaine use.  Urine drug screen not obtained this admission, however patient's urine drug screen from 3 days ago was positive for cocaine.  Patient reports he is  currently homeless, and is requesting assistance for different treatment options. His suicidal ideations are chonic in nature and lileky related to his substance abuse. He denies any recent or current self-harm, or suicidal thoughts with a plan.  He is able to contract for safety.  He further denies homicidal ideations and or hallucinations at this time.  Patient continues to present for concern for malingering. Patient reported that his SI is likely due to substance use, and has already mentioned that his homelessness is a large contribution to his depression. Provider has also noticed that patient's affect appears more positive now and patient speak more on assessment and is more interactive. Patient has a hx of long psychiatric stays with little improvement and need for housing. Patient is determined to be stable for discharge as he appears to be malingering.    Total Time spent with patient: 30 minutes  Past Psychiatric History: Depression, substance abuse.  Previous psychiatric medication trials include Wellbutrin, Lexapro, Remeron, trazodone.  History of chronic passive suicidal ideations, attempt by cutting years ago.  No suicide attempts of high lethality that resulted in medical intervention.  No recent inpatient hospitalizations, or rehab admissions.  Past Medical History:  Past Medical History:  Diagnosis Date   A-fib ([place])    Asthma    No PFTs, history of childhood asthma   CAD (coronary artery disease)    a. 06/2013 STEMI/PCI ([facility]): LAD w/ thrombus (treated with BMS), mid 75%, D2 75%; LCX OM2 75%; RCA small, PDA 95%, PLV 95%;  b. 10/2013 Cath/PCI: ISR w/in LAD (Promus DES x 2),  borderline OM2 lesion;  c. 01/2014 MV: Intermediate risk, medium-sized distal ant wall infarct w/ very small amt of peri-infarct ischemia. EF 60%.   Cellulitis 04/2014   left facial   Cellulitis and abscess of toe of right foot 12/08/2019   Chondromalacia of medial femoral condyle    Left knee MRI 04/28/12:  Chondromalacia of the medial femoral condyle with slight peripheral degeneration of the meniscocapsular junction of the medial meniscus; followed by sports medicine   Collagen vascular disease ([place])    Crack cocaine use    for 20+ years, has been enrolled in detox programs in the past   Depression    with history of hospitalization for suicidal ideation   Diabetes mellitus 2002   Diagnosed in 2002, started insulin in 2012   Gout    Gout 04/28/2012   Headache(784.0)    CT head 08/2011: Periventricular and subcortical white matter hypodensities are most in keeping with chronic microangiopathic change   HIV infection [facility]) Nov 2012   Followed by [name]   Hyperlipidemia    Hypertension    Pulmonary embolism [facility])     Past Surgical History:  Procedure Laterality Date   AMPUTATION Right 07/21/2019   Procedure: RIGHT SECOND TOE AMPUTATION;  Surgeon: [name], MD;  Location: [facility];  Service: Orthopedics;  Laterality: Right;   BACK SURGERY     1988   BOWEL RESECTION     CARDIAC SURGERY     CERVICAL SPINE SURGERY     " rods in my neck "   CORONARY ARTERY BYPASS GRAFT     CORONARY STENT PLACEMENT     NM MYOCAR PERF WALL MOTION  12/27/2011   normal   SPINE SURGERY     Family History:  Family History  Problem Relation Age of Onset   Diabetes Mother    Hypertension Mother    Hyperlipidemia Mother    Diabetes Father    Cancer Father    Hypertension Father    Diabetes Brother    Heart disease Brother    Diabetes Sister    Colon cancer Neg Hx    Family Psychiatric  History: Denies Social History:  Social History   Substance and Sexual Activity  Alcohol Use No   Alcohol/week: 4.0 standard drinks   Types: 2 Cans of beer, 2 Shots of liquor per week     Social History   Substance and Sexual Activity  Drug Use Not Currently   Frequency: 4.0 times per week   Types: "Crack" cocaine, Cocaine   Comment: Recent use of crack    Social History   Socioeconomic History    Marital status: Widowed    Spouse name: Not on file   Number of children: 2   Years of education: 10   Highest education level: 12th grade  Occupational History    Employer: UNEMPLOYED    Comment: 04/2016  Tobacco Use   Smoking status: Never   Smokeless tobacco: Never  Vaping Use   Vaping Use: Never used  Substance and Sexual Activity   Alcohol use: No    Alcohol/week: 4.0 standard drinks    Types: 2 Cans of beer, 2 Shots of liquor per week   Drug use: Not Currently    Frequency: 4.0 times per week    Types: "Crack" cocaine, Cocaine    Comment: Recent use of crack   Sexual activity: Yes    Comment: DECLINED CONDOMS  Other Topics Concern   Not on file  Social History Narrative   Currently staying with a friend in [place].  Was staying @ local motel until a few days ago - left b/c of bed bugs.   P[facility] up from [facility].  Not followed by a psychiatrist.   Social Determinants of Health   Financial Resource Strain: Not on file  Food Insecurity: Not on file  Transportation Needs: Not on file  Physical Activity: Not on file  Stress: Not on file  Social Connections: Not on file    Tobacco Cessation:  N/A, patient does not currently use tobacco products  Current Medications: Current Facility-Administered Medications  Medication Dose Route Frequency Provider Last Rate Last Admin   albuterol (VENTOLIN HFA) 108 (90 Base) MCG/ACT inhaler 1-2 puff  1-2 puff Inhalation Q6H PRN Sherwood Gambler, MD       amLODipine (NORVASC) tablet 10 mg  10 mg Oral Daily Sherwood Gambler, MD   10 mg at 10/12/21 1131   aspirin EC tablet 81 mg  81 mg Oral Daily Sherwood Gambler, MD   81 mg at 10/12/21 1131   bictegravir-emtricitabine-tenofovir AF (BIKTARVY) 50-200-25 MG per tablet 1 tablet  1 tablet Oral Daily Sherwood Gambler, MD   1 tablet at 10/12/21 1131   buPROPion (WELLBUTRIN XL) 24 hr tablet 150 mg  150 mg Oral q morning Sherwood Gambler, MD   150 mg at 10/12/21 1141   diclofenac Sodium (VOLTAREN) 1  % topical gel 2 g  2 g Topical BID PRN Sherwood Gambler, MD       insulin aspart (novoLOG) injection 4 Units  4 Units Subcutaneous TID WC Sherwood Gambler, MD   4 Units at 10/12/21 0933   isosorbide mononitrate (IMDUR) 24 hr tablet 120 mg  120 mg Oral Daily Sherwood Gambler, MD   120 mg at 10/12/21 1131   lidocaine (LIDODERM) 5 % 1 patch  1 patch Transdermal Daily PRN Sherwood Gambler, MD       mirtazapine (REMERON) tablet 15 mg  15 mg Oral QHS PRN Sherwood Gambler, MD       mometasone-formoterol (DULERA) 200-5 MCG/ACT inhaler 2 puff  2 puff Inhalation BID Sherwood Gambler, MD   2 puff at 10/12/21 0934   nitroGLYCERIN (NITROSTAT) SL tablet 0.4 mg  0.4 mg Sublingual Q5 min PRN Sherwood Gambler, MD       pantoprazole (PROTONIX) EC tablet 40 mg  40 mg Oral Daily PRN Sherwood Gambler, MD       Rivaroxaban Alveda Reasons) tablet 15 mg  15 mg Oral Daily Sherwood Gambler, MD   15 mg at 10/12/21 1132   traZODone (DESYREL) tablet 50 mg  50 mg Oral QHS PRN Sherwood Gambler, MD       Current Outpatient Medications  Medication Sig Dispense Refill   albuterol (VENTOLIN HFA) 108 (90 Base) MCG/ACT inhaler Inhale 1-2 puffs into the lungs every 6 (six) hours as needed for wheezing or shortness of breath. (Patient not taking: Reported on 10/12/2021) 8 g 1   amLODipine (NORVASC) 10 MG tablet Take 1 tablet (10 mg total) by mouth daily. (Patient not taking: Reported on 10/12/2021) 60 tablet 2   aspirin EC 81 MG tablet Take 81 mg by mouth daily. Swallow whole. (Patient not taking: Reported on 10/12/2021)     bictegravir-emtricitabine-tenofovir AF (BIKTARVY) 50-200-25 MG TABS tablet Take 1 tablet by mouth daily. (Patient not taking: Reported on 10/12/2021) 30 tablet 11   budesonide-formoterol (SYMBICORT) 160-4.5 MCG/ACT inhaler Inhale 2 puffs into the lungs in the morning and at bedtime. (Patient not  taking: Reported on 10/12/2021)     buPROPion (WELLBUTRIN XL) 150 MG 24 hr tablet Take 150 mg by mouth every morning. (Patient not taking: Reported  on 10/12/2021)     diclofenac Sodium (VOLTAREN) 1 % GEL Apply 2 g topically 2 (two) times daily as needed (for pain in shoulders, neck, and knees). (Patient not taking: Reported on 10/12/2021)     insulin glargine (LANTUS) 100 UNIT/ML Solostar Pen Inject 30 Units into the skin daily. (Patient not taking: Reported on 10/12/2021) 15 mL 1   insulin lispro (HUMALOG KWIKPEN) 100 UNIT/ML KwikPen Inject 0.04 mLs (4 Units total) into the skin 3 (three) times daily with meals. (Patient not taking: Reported on 10/12/2021) 15 mL 0   Insulin Pen Needle 31G X 5 MM MISC 4 Units by Does not apply route in the morning, at noon, and at bedtime. 100 each 0   isosorbide mononitrate (IMDUR) 60 MG 24 hr tablet Take 2 tablets (120 mg total) by mouth daily. (Patient not taking: Reported on 10/12/2021) 60 tablet 2   lidocaine (LIDODERM) 5 % Place 1 patch onto the skin daily as needed (For chestwall pain). Remove & Discard patch within 12 hours or as directed by MD (Patient not taking: Reported on 10/12/2021) 30 patch 0   mirtazapine (REMERON) 15 MG tablet Take 15 mg by mouth at bedtime as needed (sleep). (Patient not taking: Reported on 10/12/2021)     nitroGLYCERIN (NITROSTAT) 0.4 MG SL tablet Place 1 tablet (0.4 mg total) under the tongue every 5 (five) minutes as needed for chest pain. (Patient not taking: Reported on 10/12/2021) 10 tablet 0   pantoprazole (PROTONIX) 40 MG tablet Take 40 mg by mouth daily as needed (for heartburn). (Patient not taking: Reported on 10/12/2021)     traZODone (DESYREL) 50 MG tablet Take 50 mg by mouth at bedtime as needed for sleep. (Patient not taking: Reported on 10/12/2021)     XARELTO 15 MG TABS tablet Take 15 mg by mouth daily. (Patient not taking: Reported on 10/12/2021)     PTA Medications: (Not in a hospital admission)   Musculoskeletal: Strength & Muscle Tone:  Denies Gait & Station: normal Patient leans: N/A  Psychiatric Specialty Exam:  Presentation  General Appearance: Appropriate for  Environment; Disheveled Eye Contact:Fair Speech:Clear and Coherent; Slow Speech Volume:Normal Handedness:Right  Mood and Affect  Mood:Depressed Affect:Appropriate; Congruent  Thought Process  Thought Processes:Coherent; Linear Descriptions of Associations:Intact Orientation:Full (Time, Place and Person) Thought Content:Logical History of Schizophrenia/Schizoaffective disorder:No  Duration of Psychotic Symptoms:N/A Hallucinations:Hallucinations: None Ideas of Reference:None Suicidal Thoughts:Suicidal Thoughts: Yes, Passive SI Passive Intent and/or Plan: Without Intent; Without Means to Carry Out; Without Plan; Without Access to Means Homicidal Thoughts:Homicidal Thoughts: No  Sensorium  Memory:Immediate Fair; Recent Fair; Remote Fair Judgment:Fair Insight:Fair  Executive Functions  Concentration:Fair Attention Span:Fair [facility]  Psychomotor Activity  Psychomotor Activity:Psychomotor Activity: Normal  Assets  Assets:Communication Skills; Resilience; Social Support; Desire for Improvement  Sleep  Sleep:Sleep: Fair   Physical Exam: Physical Exam Vitals reviewed.  Neurological:     General: No focal deficit present.     Mental Status: He is oriented to person, place, and time.  Psychiatric:        Mood and Affect: Mood normal.        Behavior: Behavior normal.   Review of Systems  Psychiatric/Behavioral:  Positive for substance abuse (cocaine) and suicidal ideas (passive).   All other systems reviewed and are negative. Blood pressure (!) 152/118, pulse 68,  temperature 98.4 F (36.9 C), temperature source Oral, resp. rate 16, height 5\' 8"  (1.727 m), weight 73.9 kg, SpO2 100 %. Body mass index is 24.78 kg/m.   Demographic Factors:  Male  Loss Factors: Decrease in vocational status, Decline in physical health, and Financial problems/change in socioeconomic status  Historical Factors: Prior suicide attempts  Risk  Reduction Factors:   Sense of responsibility to family, Positive social support, Positive therapeutic relationship, and Positive coping skills or problem solving skills  Continued Clinical Symptoms:  Alcohol/Substance Abuse/Dependencies Unstable or Poor Therapeutic Relationship Previous Psychiatric Diagnoses and Treatments Medical Diagnoses and Treatments/Surgeries  Cognitive Features That Contribute To Risk:  None    Suicide Risk:  Minimal: No identifiable suicidal ideation.  Patients presenting with no risk factors but with morbid ruminations; may be classified as minimal risk based on the severity of the depressive symptoms    Plan Of Care/Follow-up recommendations:  Other:  Psych clear. May need TOC for transportation or voucher to shelter, and consider referrals to substance abuse facility.  Continue current medications.  -Patient has maximized most of his resources as he reports, attempts to obtain housing through [facility] in various homeless shelters.  Will place another [facility] consult for homeless shelters. -Outpatient referral to [facility].  Disposition: Psych Cleared.  Suella Broad, FNP 10/12/2021, 12:16 PM

## 2021-10-12 NOTE — BH Assessment (Signed)
Comprehensive Clinical Assessment (CCA) Note  10/12/2021 Michael [name] [id number]  Disposition: Margorie John, PA-C recommends pt to observed and reassessed. Disposition discussed with Ysidro Evert T. Donnal Debar, RN and [employer]. Michael [name], NT.   [place] ED from 10/11/2021 in [place] DEPT ED from 10/09/2021 in [place] [facility]PT ED from 10/08/2021 in [place] DEPT  C-SSRS RISK CATEGORY High Risk Error: Question 1 not populated Error: Q7 should not be populated when Q6 is No       The patient demonstrates the following risk factors for suicide: Chronic risk factors for suicide include: psychiatric disorder of Pt reports, he smoked Crack two weeks ago but cannot remember how much he used, substance use disorder, and previous suicide attempts Pt reports, last year he cut his wrist . Acute risk factors for suicide include: social withdrawal/isolation and Pt reports, he's suicidal with no plan . Protective factors for this patient include:  None . Considering these factors, the overall suicide risk at this point appears to be high. Patient is appropriate for outpatient follow up.  Michael [name] is 71 year old who presents voluntary and unaccompanied. Clinician asked the pt, "what brought you to the hospital?" Pt reports, "everything." Pt reports, [place] cour[place] he [place] 911. Pt reports, he got to the point where wanting to die stays on his mind all the time. Pt denies, having a plan or intent. Pt reports, seeing a guy in a 10 gallon hat walk across the floor and tell him he's no good. Pt reports, he's been in [place] for about a month after getting out of the hospital in [place]. Pt reports, he was in and out of the hospital in [place] for health reasons; his doctors are in [place] but he moved to [place] because he didn't care for [place]. Pt also denies, HI, self-injurious  behaviors and access to weapons.   Pt reports, he smoked Crack two weeks ago but cannot remember how much he used. Pt's UDS is pending. Pt denies, being linked to OPT resources (medication management and/or counseling.) Pt denies, taking psychotropic medications.  Pt presents with his eyes closed and a mask on with a blanket over him (stopping at his neck.) Pt's mood, affect was depressed. Pt's insight was fair. Pt's judgement was poor. Pt reports, if discharged he can not contract for safety, he feels he will hurt himself however pt continues to denies having a plan and access to weapons.   Diagnosis: Major Depressive Disorder), recurrent episode, severe with psychotic features.   *Pt declined for clinician to contact family and/or friends to obtain additional information. Pt denies, having supports. Pt reports, he's not sure if his children are awake of his medical issues.*  Chief Complaint:  Chief Complaint  Patient presents with   Suicidal   Chest Pain   Visit Diagnosis:     CCA Screening, Triage and Referral (STR)  Patient Reported Information How did you hear about [place]? Legal System (Pt called 911.)  What Is the Reason for Your Visit/Call Today? Per EDP note: "71 year old male presents with suicidal thoughts and chest pain. He is very vague on his description. Seems like the chest pain has been since he had his CABG over a year ago. The suicidal thoughts has been for longer though things seem to be worse over the last 6 months.  He tells me he has a plan but then will not elicit what it is.  He states his legs  are swollen chronically. He has some chronic abdominal pain as well. He feels like all of his symptoms including his depression are slowly worsening."  How Long Has This Been Causing You Problems? > than 6 months  What Do You Feel Would Help You the Most Today? Treatment for Depression or other mood problem; Housing Assistance; Alcohol or Drug Use Treatment (AVH.)   Have You  Recently Had Any Thoughts About Hurting Yourself? Yes  Are You Planning to Commit Suicide/Harm Yourself At This time? Yes   Have you Recently Had Thoughts About Hurting Someone [name]? No  Are You Planning to Harm Someone at This Time? No  Explanation: No data recorded  Have You Used Any Alcohol or Drugs in the Past 24 Hours? No  How Long Ago Did You Use Drugs or Alcohol? No data recorded What Did You Use and How Much? Pt reports, he smoked Crack two weeks ago but cannot remember how much he used.   Do You Currently Have a Therapist/Psychiatrist? No  Name of Therapist/Psychiatrist: No data recorded  Have You Been Recently Discharged From Any Office Practice or Programs? No  Explanation of Discharge From Practice/Program: No data recorded    CCA Screening Triage Referral Assessment Type of Contact: Tele-Assessment  Telemedicine Service Delivery: Telemedicine service delivery: This service was provided via telemedicine using a 2-way, interactive audio and video technology  Is this Initial or Reassessment? Initial Assessment  Date Telepsych consult ordered in [facility]:  10/11/21  Time Telepsych consult ordered in [facility]:  2107  Location of Assessment: [facility]  Provider Location: [facility]   Collateral Involvement: Pt declined for clinician to contact family and/or friends to obtain additional information. Pt denies, having supports.   Does Patient Have a Stage manager Guardian? No data recorded Name and Contact of Legal Guardian: No data recorded If Minor and Not Living with Parent(s), Who has Custody? No data recorded Is CPS involved or ever been involved? Never  Is APS involved or ever been involved? Never   Patient Determined To Be At Risk for Harm To Self or Others Based on Review of Patient Reported Information or Presenting Complaint? Yes, for Self-Harm  Method: No data recorded Availability of Means: No data recorded Intent: No data  recorded Notification Required: No data recorded Additional Information for Danger to Others Potential: No data recorded Additional Comments for Danger to Others Potential: No data recorded Are There Guns or Other Weapons in Your Home? No data recorded Types of Guns/Weapons: No data recorded Are These Weapons Safely Secured?                            No data recorded Who Could Verify You Are Able To Have These Secured: No data recorded Do You Have any Outstanding Charges, Pending Court Dates, Parole/Probation? No data recorded Contacted To Inform of Risk of Harm To Self or Others: Law Enforcement    Does Patient Present under Involuntary Commitment? No  IVC Papers Initial File Date: No data recorded  [place] of Residence: [place]   Patient Currently Receiving the Following Services: Not Receiving Services   Determination of Need: Urgent (48 hours)   Options For Referral: Chemical Dependency Intensive Outpatient Therapy (CDIOP); Medication Management; Outpatient Therapy     CCA Biopsychosocial Patient Reported Schizophrenia/Schizoaffective Diagnosis in Past: No   Strengths: No data recorded  Mental Health Symptoms Depression:   Fatigue; Hopelessness; Sleep (too much or little); Worthlessness; Tearfulness;  Difficulty Concentrating; Increase/decrease in appetite; Irritability (Guilt, isolation.)   Duration of Depressive symptoms:    Mania:   None   Anxiety:    Sleep; Worrying; Tension; Difficulty concentrating (Pt reports, he had a panic attacks about a month ago.)   Psychosis:   Hallucinations   Duration of Psychotic symptoms:    Trauma:   None   Obsessions:   None   Compulsions:   None   Inattention:   Disorganized; Forgetful; Loses things   Hyperactivity/Impulsivity:   None   Oppositional/Defiant Behaviors:   Angry   Emotional Irregularity:   Potentially harmful impulsivity; Recurrent suicidal behaviors/gestures/threats   Other  Mood/Personality Symptoms:  No data recorded   Mental Status Exam Appearance and self-care  Stature:   Average   Weight:   Average weight   Clothing:   -- (Pt had a blanket drapped over him.)   Grooming:   Normal   Cosmetic use:   None   Posture/gait:   Other (Comment) (Pt sitting in chair.)   Motor activity:   Not Remarkable   Sensorium  Attention:   Normal   Concentration:   Normal   Orientation:   X5   Recall/memory:   Normal   Affect and Mood  Affect:   Depressed   Mood:   Depressed   Relating  Eye contact:   Normal   Facial expression:   Depressed   Attitude toward examiner:   Cooperative   Thought and Language  Speech flow:  Soft   Thought content:   Appropriate to Mood and Circumstances   Preoccupation:   None   Hallucinations:   Auditory; Visual   Organization:  No data recorded  [employer] of Knowledge:   Average   Intelligence:   Average   Abstraction:   Normal   Judgement:   Poor   Reality Testing:   Adequate   Insight:   Fair   Decision Making:   Only simple   Social Functioning  Social Maturity:   Isolates   Social Judgement:   "Street Smart"   Stress  Stressors:   Housing; Illness   Coping Ability:   Overwhelmed; Deficient supports   Skill Deficits:   Decision making   Supports:   Support needed     Religion: Religion/Spirituality Are You A Religious Person?: No  Leisure/Recreation: Leisure / Recreation Do You Have Hobbies?: No  Exercise/Diet: Exercise/Diet Do You Exercise?: No Have You Gained or Lost A Significant Amount of Weight in the Past Six Months?: No Number of Pounds Lost?:  (Per history, Pt recently lost 19 lbs.) Do You Follow a Special Diet?: No Explanation of Sleeping Difficulties: Pt reports, getting 2 hours per night. Pt reports, he's homeless and sleeps outside on the streets.   CCA Employment/Education Employment/Work Situation: Employment /  Work Situation Employment Situation: On disability Why is Patient on Disability: Pt reports, he got injured on the job. How Long has Patient Been on Disability: Pt reports, he was injured on July 07, 1984. Patient's Job has Been Impacted by Current Illness: No Has Patient ever Been in the [employer]?: No  Education: Education Is Patient Currently Attending School?: No Last Grade Completed: 12 Did You Attend College?: No   CCA Family/Childhood History Family and Relationship History: Family history Marital status: Widowed Widowed, when?: Pt reports, his wife died two years ago. Does patient have children?: Yes How many children?: 2 How is patient's relationship with their children?: Pt reports, he does not want  to be a burden to his children. Pt reports, he's not sure if kids know about his housing and health issues.  Childhood History:  Childhood History By whom was/is the patient raised?: Both parents (Per chart.) Did patient suffer any verbal/emotional/physical/sexual abuse as a child?: No Did patient suffer from severe childhood neglect?: No Has patient ever been sexually abused/assaulted/raped as an adolescent or adult?: No Was the patient ever a victim of a crime or a disaster?: No Witnessed domestic violence?: No Has patient been affected by domestic violence as an adult?:  (NA)  Child/Adolescent Assessment:     CCA Substance Use Alcohol/Drug Use: Alcohol / Drug Use Pain Medications: See MAR Prescriptions: See MAR Over the Counter: See MAR Longest period of sobriety (when/how long): Per chart, two months. Negative Consequences of Use: Personal relationships, Financial Withdrawal Symptoms: None Substance #1 Name of Substance 1: Cocaine. 1 - Age of First Use: UTA 1 - Amount (size/oz): Pt is unsure. 1 - Frequency: Ongoing. 1 - Duration: UTA 1 - Last Use / Amount: Pt reports, two weeks ago. 1 - Method of Aquiring: Purchase. 1- Route of Use: Inhalation.     ASAM's:  Six Dimensions of Multidimensional Assessment  Dimension 1:  Acute Intoxication and/or Withdrawal Potential:      Dimension 2:  Biomedical Conditions and Complications:      Dimension 3:  Emotional, Behavioral, or Cognitive Conditions and Complications:     Dimension 4:  Readiness to Change:     Dimension 5:  Relapse, Continued use, or Continued Problem Potential:     Dimension 6:  Recovery/Living Environment:     ASAM Severity Score:    ASAM Recommended Level of Treatment:     Substance use Disorder (SUD)    Recommendations for Services/Supports/Treatments: Recommendations for Services/Supports/Treatments Recommendations For Services/Supports/Treatments: Other (Comment) (Pt to be observed and reassessed by psychiatry.)  Discharge Disposition:    DSM5 Diagnoses: Patient Active Problem List   Diagnosis Date Noted   Iron deficiency anemia 04/04/2021   Atypical chest pain 04/03/2021   Homelessness 12/13/2019   Cellulitis 12/07/2019   Osteomyelitis ([place]) 07/17/2019   Abscess or cellulitis of toe, right    Toe pain, right-second    MDD (major depressive disorder), recurrent episode, severe ([place]) 06/15/2019   Respiratory distress 04/12/2019   Pneumonia due to severe acute respiratory syndrome coronavirus 2 (SARS-CoV-2) 04/11/2019   COVID-19 virus infection 03/19/2019   Chest pain 10/03/2018   Malnutrition of moderate degree 09/21/2018   MDD (major depressive disorder), recurrent episode, moderate ([name])    Type II diabetes mellitus with renal manifestations ([place]) 05/04/2018   GERD (gastroesophageal reflux disease) 05/04/2018   S/P CABG (coronary artery bypass graft)    Left testicular pain    Depression 02/20/2018   Epididymo-orchitis, acute 02/20/2018   Essential hypertension 02/20/2018   Precordial chest pain 02/20/2018   AKI (acute kidney injury) ([place]) 02/14/2018   HCAP (healthcare-associated pneumonia) 02/14/2018   History of pulmonary embolism 07/04/2017    Acute hyponatremia 05/11/2017   Hyperglycemia due to type 2 diabetes mellitus ([place]) 05/11/2017   CHF (congestive heart failure) ([place]) 04/01/2017   Hepatitis C 12/30/2016   Substance induced mood disorder ([place]) 11/23/2016   Chronic ischemic heart disease 11/12/2016   Paroxysmal A-fib ([place]) 11/12/2016   Back pain 04/18/2016   S/P carotid endarterectomy 11/15/2015   MDD (major depressive disorder), recurrent severe, without psychosis ([place]) 09/09/2015   Cocaine-induced mood disorder ([place]) 08/14/2015   Cocaine abuse with cocaine-induced mood disorder ([place]) 08/14/2015  Gout 07/10/2015   Acute renal failure superimposed on stage 3 chronic kidney disease ([place]) 03/06/2015   Anemia 03/06/2015   Chronic kidney disease, stage III (moderate) ([place]) 03/06/2015   Hypoglycemia    Encounter for general adult medical examination with abnormal findings 02/09/2015   Cocaine use disorder, severe, dependence ([place]) 12/13/2014   Substance or medication-induced depressive disorder with onset during withdrawal ([place]) 12/13/2014   Severe recurrent major depressive disorder with psychotic features ([place]) 12/12/2014   Cervicalgia 06/28/2014   Lumbar radiculopathy, chronic 06/28/2014   Asthma, chronic 02/03/2014   3-vessel CAD 06/24/2013   ED (erectile dysfunction) of organic origin 07/07/2012   Hypertension goal BP (blood pressure) < 140/80 04/29/2012   Chondromalacia of left knee 03/19/2012   Hyperlipidemia with target LDL less than 100 02/12/2012   Fibromyalgia 02/12/2012   HIV (human immunodeficiency virus infection) ([place]) 08/27/2011   Uncontrolled type 2 diabetes with neuropathy 10/17/2000     Referrals to Alternative Service(s): Referred to Alternative Service(s):   Place:   Date:   Time:    Referred to Alternative Service(s):   Place:   Date:   Time:    Referred to Alternative Service(s):   Place:   Date:   Time:    Referred to Alternative Service(s):   Place:   Date:   Time:     Vertell Novak, [facility] Comprehensive Clinical Assessment (CCA) Screening, Triage and Referral Note  10/12/2021 Michael [name] [id number]  Chief Complaint:  Chief Complaint  Patient presents with   Suicidal   Chest Pain   Visit Diagnosis:   Patient Reported Information How did you hear about [place]? Legal System (Pt called 911.)  What Is the Reason for Your Visit/Call Today? Per EDP note: "71 year old male presents with suicidal thoughts and chest pain. He is very vague on his description. Seems like the chest pain has been since he had his CABG over a year ago. The suicidal thoughts has been for longer though things seem to be worse over the last 6 months.  He tells me he has a plan but then will not elicit what it is.  He states his legs are swollen chronically. He has some chronic abdominal pain as well. He feels like all of his symptoms including his depression are slowly worsening."  How Long Has This Been Causing You Problems? > than 6 months  What Do You Feel Would Help You the Most Today? Treatment for Depression or other mood problem; Housing Assistance; Alcohol or Drug Use Treatment (AVH.)   Have You Recently Had Any Thoughts About Hurting Yourself? Yes  Are You Planning to Commit Suicide/Harm Yourself At This time? Yes   Have you Recently Had Thoughts About Hurting Someone [name]? No  Are You Planning to Harm Someone at This Time? No  Explanation: No data recorded  Have You Used Any Alcohol or Drugs in the Past 24 Hours? No  How Long Ago Did You Use Drugs or Alcohol? No data recorded What Did You Use and How Much? Pt reports, he smoked Crack two weeks ago but cannot remember how much he used.   Do You Currently Have a Therapist/Psychiatrist? No  Name of Therapist/Psychiatrist: No data recorded  Have You Been Recently Discharged From Any Office Practice or Programs? No  Explanation of Discharge From Practice/Program: No data recorded   CCA Screening Triage Referral  Assessment Type of Contact: Tele-Assessment  Telemedicine Service Delivery: Telemedicine service delivery: This service was provided via telemedicine using a  2-way, interactive audio and video technology  Is this Initial or Reassessment? Initial Assessment  Date Telepsych consult ordered in [facility]:  10/11/21  Time Telepsych consult ordered in [facility]:  2107  Location of Assessment: [facility]  Provider Location: [facility]   Collateral Involvement: Pt declined for clinician to contact family and/or friends to obtain additional information. Pt denies, having supports.   Does Patient Have a Stage manager Guardian? No data recorded Name and Contact of Legal Guardian: No data recorded If Minor and Not Living with Parent(s), Who has Custody? No data recorded Is CPS involved or ever been involved? Never  Is APS involved or ever been involved? Never   Patient Determined To Be At Risk for Harm To Self or Others Based on Review of Patient Reported Information or Presenting Complaint? Yes, for Self-Harm  Method: No data recorded Availability of Means: No data recorded Intent: No data recorded Notification Required: No data recorded Additional Information for Danger to Others Potential: No data recorded Additional Comments for Danger to Others Potential: No data recorded Are There Guns or Other Weapons in Your Home? No data recorded Types of Guns/Weapons: No data recorded Are These Weapons Safely Secured?                            No data recorded Who Could Verify You Are Able To Have These Secured: No data recorded Do You Have any Outstanding Charges, Pending Court Dates, Parole/Probation? No data recorded Contacted To Inform of Risk of Harm To Self or Others: Law Enforcement   Does Patient Present under Involuntary Commitment? No  IVC Papers Initial File Date: No data recorded  [place] of Residence: [place]   Patient Currently Receiving the Following Services:  Not Receiving Services   Determination of Need: Urgent (48 hours)   Options For Referral: Chemical Dependency Intensive Outpatient Therapy (CDIOP); Medication Management; Outpatient Therapy   Discharge Disposition:     Vertell Novak, [place], [place], [facility] Triage Specialist [phone]

## 2021-10-12 NOTE — Progress Notes (Signed)
Transition of Care [name]) - Emergency Department Mini Assessment   Patient Details  Name: Michael [name] MRN: [id number] Date of Birth: [birth year]  Transition of Care [facility]) CM/SW Contact:    Illene Regulus, LCSW Phone Number: 10/12/2021, 1:09 PM   Clinical Narrative:  Pt is homeless with frequent visit to the ED. CSW has spoken with pt and provided shelter resources. CSW also informed pt if he is seeking ALF he will have to work with his PCP to obtain placement. Pt is on [employer]. Pt was informed he can go to [facility] day center for resource as well.  Pt can receive bus pass to [facility] upon d/c.   ED Mini Assessment: What brought you to the Emergency Department? : SI  Barriers to Discharge: No Barriers Identified        Interventions which prevented an admission or readmission: Homeless Screening    Patient Contact and Communications        ,                 Admission diagnosis:  SI; Chest Pain Patient Active Problem List   Diagnosis Date Noted   Iron deficiency anemia 04/04/2021   Atypical chest pain 04/03/2021   Homelessness 12/13/2019   Cellulitis 12/07/2019   Osteomyelitis ([place]) 07/17/2019   Abscess or cellulitis of toe, right    Toe pain, right-second    MDD (major depressive disorder), recurrent episode, severe ([place]) 06/15/2019   Respiratory distress 04/12/2019   Pneumonia due to severe acute respiratory syndrome coronavirus 2 (SARS-CoV-2) 04/11/2019   COVID-19 virus infection 03/19/2019   Chest pain 10/03/2018   Malnutrition of moderate degree 09/21/2018   MDD (major depressive disorder), recurrent episode, moderate ([name])    Type II diabetes mellitus with renal manifestations ([place]) 05/04/2018   GERD (gastroesophageal reflux disease) 05/04/2018   S/P CABG (coronary artery bypass graft)    Left testicular pain    Depression 02/20/2018   Epididymo-orchitis, acute 02/20/2018   Essential hypertension 02/20/2018    Precordial chest pain 02/20/2018   AKI (acute kidney injury) ([place]) 02/14/2018   HCAP (healthcare-associated pneumonia) 02/14/2018   History of pulmonary embolism 07/04/2017   Acute hyponatremia 05/11/2017   Hyperglycemia due to type 2 diabetes mellitus ([place]) 05/11/2017   CHF (congestive heart failure) ([place]) 04/01/2017   Hepatitis C 12/30/2016   Substance induced mood disorder ([place]) 11/23/2016   Chronic ischemic heart disease 11/12/2016   Paroxysmal A-fib ([place]) 11/12/2016   Back pain 04/18/2016   S/P carotid endarterectomy 11/15/2015   MDD (major depressive disorder), recurrent severe, without psychosis ([place]) 09/09/2015   Cocaine-induced mood disorder ([place]) 08/14/2015   Cocaine abuse with cocaine-induced mood disorder ([place]) 08/14/2015   Gout 07/10/2015   Acute renal failure superimposed on stage 3 chronic kidney disease ([place]) 03/06/2015   Anemia 03/06/2015   Chronic kidney disease, stage III (moderate) ([place]) 03/06/2015   Hypoglycemia    Encounter for general adult medical examination with abnormal findings 02/09/2015   Cocaine use disorder, severe, dependence ([place]) 12/13/2014   Substance or medication-induced depressive disorder with onset during withdrawal ([place]) 12/13/2014   Severe recurrent major depressive disorder with psychotic features ([place]) 12/12/2014   Cervicalgia 06/28/2014   Lumbar radiculopathy, chronic 06/28/2014   Asthma, chronic 02/03/2014   3-vessel CAD 06/24/2013   ED (erectile dysfunction) of organic origin 07/07/2012   Hypertension goal BP (blood pressure) < 140/80 04/29/2012   Chondromalacia of left knee 03/19/2012   Hyperlipidemia with target LDL less than  100 02/12/2012   Fibromyalgia 02/12/2012   HIV (human immunodeficiency virus infection) ([place]) 08/27/2011   Uncontrolled type 2 diabetes with neuropathy 10/17/2000   PCP:  Kerin Perna, NP Pharmacy:   [name] [address] [address] Phone: [phone]  Fax: [phone]

## 2021-10-12 NOTE — Discharge Instructions (Addendum)
Please continue to take medications as directed. If your symptoms return, worsen, or persist please call your 911, report to local ER, or contact crisis hotline. Please do not drink alcohol or use any illegal substances while taking prescription medications.    [facility] [address], [place] can begin line up at 5:30pm and check-in is at [employer].   Emergency Shelters/ Transitional Housing  [facility]  Address: [name]  Phone: [phone]  [place] [facility]  Address: [place], [place] [address]  Phone: [phone]  ([place] [facility]  Address: [address] [place] [address]  Phone: [facility] Address: [place], [place], [place] [address]  Phone: [phone]  [facility] Address: [address] [place], [place] [address]  Phone: [phone]  ([place] [facility] [place].  Address: [address] [place], [place] [address] Phone: [phone]

## 2021-10-14 ENCOUNTER — Emergency Department (HOSPITAL_COMMUNITY): Payer: Medicare Other

## 2021-10-14 ENCOUNTER — Other Ambulatory Visit: Payer: Self-pay

## 2021-10-14 ENCOUNTER — Emergency Department (HOSPITAL_COMMUNITY)
Admission: EM | Admit: 2021-10-14 | Discharge: 2021-10-15 | Disposition: A | Discharge status: Home / Self Care | Payer: Medicare Other | Attending: Emergency Medicine | Admitting: Emergency Medicine

## 2021-10-14 DIAGNOSIS — F1410 Cocaine abuse, uncomplicated: Secondary | ICD-10-CM

## 2021-10-14 DIAGNOSIS — R45851 Suicidal ideations: Secondary | ICD-10-CM | POA: Insufficient documentation

## 2021-10-14 DIAGNOSIS — Z5900 Homelessness unspecified: Secondary | ICD-10-CM

## 2021-10-14 DIAGNOSIS — G8929 Other chronic pain: Secondary | ICD-10-CM

## 2021-10-14 DIAGNOSIS — Z7982 Long term (current) use of aspirin: Secondary | ICD-10-CM | POA: Diagnosis not present

## 2021-10-14 DIAGNOSIS — Y900 Blood alcohol level of less than 20 mg/100 ml: Secondary | ICD-10-CM | POA: Diagnosis not present

## 2021-10-14 DIAGNOSIS — F332 Major depressive disorder, recurrent severe without psychotic features: Secondary | ICD-10-CM | POA: Diagnosis present

## 2021-10-14 DIAGNOSIS — F1994 Other psychoactive substance use, unspecified with psychoactive substance-induced mood disorder: Secondary | ICD-10-CM

## 2021-10-14 DIAGNOSIS — R079 Chest pain, unspecified: Secondary | ICD-10-CM | POA: Diagnosis not present

## 2021-10-14 DIAGNOSIS — I10 Essential (primary) hypertension: Secondary | ICD-10-CM

## 2021-10-14 LAB — RAPID URINE DRUG SCREEN, HOSP PERFORMED
Amphetamines: NOT DETECTED
Barbiturates: NOT DETECTED
Benzodiazepines: NOT DETECTED
Cocaine: POSITIVE — AB
Opiates: NOT DETECTED
Tetrahydrocannabinol: NOT DETECTED

## 2021-10-14 LAB — CBC
HCT: 31.8 % — ABNORMAL LOW (ref 39.0–52.0)
Hemoglobin: 10 g/dL — ABNORMAL LOW (ref 13.0–17.0)
MCH: 27.8 pg (ref 26.0–34.0)
MCHC: 31.4 g/dL (ref 30.0–36.0)
MCV: 88.3 fL (ref 80.0–100.0)
Platelets: 174 10*3/uL (ref 150–400)
RBC: 3.6 MIL/uL — ABNORMAL LOW (ref 4.22–5.81)
RDW: 12.6 % (ref 11.5–15.5)
WBC: 4.4 10*3/uL (ref 4.0–10.5)
nRBC: 0 % (ref 0.0–0.2)

## 2021-10-14 LAB — SALICYLATE LEVEL: Salicylate Lvl: <7 mg/dL — ABNORMAL LOW (ref 7.0–30.0)

## 2021-10-14 LAB — BASIC METABOLIC PANEL
Anion gap: 10 (ref 5–15)
BUN: 38 mg/dL — ABNORMAL HIGH (ref 8–23)
CO2: 23 mmol/L (ref 22–32)
Calcium: 8.8 mg/dL — ABNORMAL LOW (ref 8.9–10.3)
Chloride: 107 mmol/L (ref 98–111)
Creatinine, Ser: 2.55 mg/dL — ABNORMAL HIGH (ref 0.61–1.24)
Glucose, Bld: 307 mg/dL — ABNORMAL HIGH (ref 70–99)
Potassium: 4.1 mmol/L (ref 3.5–5.1)
Sodium: 140 mmol/L (ref 135–145)

## 2021-10-14 LAB — ACETAMINOPHEN LEVEL: Acetaminophen (Tylenol), Serum: <10 ug/mL — ABNORMAL LOW (ref 10–30)

## 2021-10-14 LAB — TROPONIN I (HIGH SENSITIVITY): Troponin I (High Sensitivity): 16 ng/L (ref <18)

## 2021-10-14 LAB — ETHANOL: Alcohol, Ethyl (B): <10 mg/dL (ref <10)

## 2021-10-14 NOTE — ED Triage Notes (Signed)
Pt brought in by EMS for left side chest pain and suicidal thoughts. Pt states the suicidal thoughts were triggered by his declining health. Pt denies a plan.

## 2021-10-14 NOTE — ED Provider Triage Note (Signed)
Emergency Medicine Provider Triage Evaluation Note  Michael [name] , a 71 y.o. male  was evaluated in triage.  Pt complains of chest pain for one year since triple bypass. Worse with exertion. Radiates to left arm and neck. Some nausea, no vomiting. Reports SI without plan. Denies HI. Endorses hallucinating "a man in a black hat from the government".  Review of Systems  Positive: Chest pain, SI, shob Negative: Vomiting, abdominal pain  Physical Exam  BP (!) 157/90    Pulse 91    Temp 98.6 F (37 C)    Resp 17    Ht 5\' 8"  (1.727 m)    Wt 73.9 kg    SpO2 100%    BMI 24.78 kg/m  Gen:   Awake, no distress   Resp:  Normal effort  MSK:   Moves extremities without difficulty  Other:  No ttp abdomen  Medical Decision Making  Medically screening exam initiated at 8:38 PM.  Appropriate orders placed.  [name] was informed that the remainder of the evaluation will be completed by another provider, this initial triage assessment does not replace that evaluation, and the importance of remaining in the ED until their evaluation is complete.  Workup initiated   Anselmo Pickler, [place] 10/14/21 2040

## 2021-10-15 ENCOUNTER — Encounter (HOSPITAL_COMMUNITY): Payer: Self-pay | Admitting: Registered Nurse

## 2021-10-15 LAB — TROPONIN I (HIGH SENSITIVITY)
Troponin I (High Sensitivity): 71 ng/L — ABNORMAL HIGH (ref <18)
Troponin I (High Sensitivity): 24 ng/L — ABNORMAL HIGH (ref <18)
Troponin I (High Sensitivity): 21 ng/L — ABNORMAL HIGH (ref <18)

## 2021-10-15 MED ORDER — AMLODIPINE BESYLATE 5 MG PO TABS
10.0000 mg | ORAL_TABLET | Freq: Once | ORAL | Status: AC
  Administered 2021-10-15: 10 mg via ORAL
  Filled 2021-10-15: qty 2

## 2021-10-15 MED ORDER — ACETAMINOPHEN 500 MG PO TABS
500.0000 mg | ORAL_TABLET | Freq: Once | ORAL | Status: AC
Start: 2021-10-15 — End: 2021-10-15
  Administered 2021-10-15: 500 mg via ORAL
  Filled 2021-10-15: qty 1

## 2021-10-15 MED ORDER — BUPROPION HCL ER (XL) 150 MG PO TB24
150.0000 mg | ORAL_TABLET | Freq: Once | ORAL | Status: AC
  Administered 2021-10-15: 150 mg via ORAL
  Filled 2021-10-15 (×2): qty 1

## 2021-10-15 MED ORDER — HYDRALAZINE HCL 25 MG PO TABS
75.0000 mg | ORAL_TABLET | Freq: Once | ORAL | Status: AC
  Administered 2021-10-15: 75 mg via ORAL
  Filled 2021-10-15: qty 3

## 2021-10-15 NOTE — ED Provider Notes (Signed)
Michael [name]   CSN: [id number] Arrival date & time: 10/14/21  2005     History  Chief Complaint  Patient presents with   Chest Pain   Suicidal    Michael [name] is a 71 y.o. male.  Presenting to the ER with concern for chest pain and suicidal ideation.  Patient reports that ever since he had a CABG over a year ago he has continued to have chest pains ever since.  No significant change today, pain is moderate in nature, comes and goes without any particular alleviating or aggravating factors.  No associated difficulty in breathing.  Also states that he has been dealing with suicidal ideation for a long time.  He denies any plan or intent to carry this out.  No auditory visual hallucinations.  Completed chart review, over the past 2 months patient has had frequent visits for suicidal ideation, chest pain.  HPI     Home Medications Prior to Admission medications   Medication Sig Start Date End Date Taking? Authorizing Provider  albuterol (VENTOLIN HFA) 108 (90 Base) MCG/ACT inhaler Inhale 1-2 puffs into the lungs every 6 (six) hours as needed for wheezing or shortness of breath. Patient not taking: Reported on 10/15/2021 12/09/19   Eugenie Filler, MD  amLODipine (NORVASC) 10 MG tablet Take 1 tablet (10 mg total) by mouth daily. Patient not taking: Reported on 10/15/2021 04/05/21   Lacinda Axon, MD  aspirin EC 81 MG tablet Take 81 mg by mouth daily. Swallow whole. Patient not taking: Reported on 10/15/2021    [provider]  bictegravir-emtricitabine-tenofovir AF (BIKTARVY) 50-200-25 MG TABS tablet Take 1 tablet by mouth daily. Patient not taking: Reported on 10/15/2021 04/25/21   Mignon Pine, DO  budesonide-formoterol [facility]) 160-4.5 MCG/ACT inhaler Inhale 2 puffs into the lungs in the morning and at bedtime. Patient not taking: Reported on 10/15/2021 04/22/21   [provider]  buPROPion (WELLBUTRIN  XL) 150 MG 24 hr tablet Take 150 mg by mouth every morning. Patient not taking: Reported on 10/15/2021 04/22/21   [provider]  diclofenac Sodium (VOLTAREN) 1 % GEL Apply 2 g topically 2 (two) times daily as needed (for pain in shoulders, neck, and knees). Patient not taking: Reported on 10/15/2021    [provider]  hydrALAZINE (APRESOLINE) 25 MG tablet Take 75 mg by mouth 3 (three) times daily. Patient not taking: Reported on 10/15/2021 07/14/21   [provider]  insulin glargine (LANTUS) 100 UNIT/ML Solostar Pen Inject 30 Units into the skin daily. Patient not taking: Reported on 10/12/2021 12/09/19   Eugenie Filler, MD  insulin lispro (HUMALOG KWIKPEN) 100 UNIT/ML KwikPen Inject 0.04 mLs (4 Units total) into the skin 3 (three) times daily with meals. Patient not taking: Reported on 10/12/2021 12/09/19   Eugenie Filler, MD  Insulin Pen Needle 31G X 5 MM MISC 4 Units by Does not apply route in the morning, at noon, and at bedtime. 12/09/19   Eugenie Filler, MD  isosorbide mononitrate (IMDUR) 60 MG 24 hr tablet Take 2 tablets (120 mg total) by mouth daily. Patient not taking: Reported on 10/12/2021 04/04/21 10/01/21  Lacinda Axon, MD  lidocaine (LIDODERM) 5 % Place 1 patch onto the skin daily as needed (For chestwall pain). Remove & Discard patch within 12 hours or as directed by MD Patient not taking: Reported on 10/12/2021 04/04/21   Lacinda Axon, MD  mirtazapine (REMERON) 15 MG  tablet Take 15 mg by mouth at bedtime as needed (sleep). Patient not taking: Reported on 10/12/2021 04/18/21   [provider]  nitroGLYCERIN (NITROSTAT) 0.4 MG SL tablet Place 1 tablet (0.4 mg total) under the tongue every 5 (five) minutes as needed for chest pain. Patient not taking: Reported on 10/12/2021 12/09/19   Eugenie Filler, MD  pantoprazole (PROTONIX) 40 MG tablet Take 40 mg by mouth daily as needed (for heartburn). Patient not taking: Reported on 10/12/2021    [provider]  traZODone (DESYREL) 50 MG tablet Take 50 mg by mouth at bedtime as needed for sleep. Patient not taking: Reported on 10/12/2021 06/28/21   [provider]  XARELTO 15 MG TABS tablet Take 15 mg by mouth daily. Patient not taking: Reported on 10/12/2021 04/22/21   [provider]      Allergies    Patient has no known allergies.    Review of Systems   Review of Systems  Constitutional:  Positive for fatigue. Negative for chills and fever.  HENT:  Negative for ear pain and sore throat.   Eyes:  Negative for pain and visual disturbance.  Respiratory:  Negative for cough and shortness of breath.   Cardiovascular:  Positive for chest pain. Negative for palpitations.  Gastrointestinal:  Negative for abdominal pain and vomiting.  Genitourinary:  Negative for dysuria and hematuria.  Musculoskeletal:  Negative for arthralgias and back pain.  Skin:  Negative for color change and rash.  Neurological:  Negative for seizures and syncope.  Psychiatric/Behavioral:  Positive for suicidal ideas.   All other systems reviewed and are negative.  Physical Exam Updated Vital Signs BP (!) 155/78    Pulse 92    Temp 97.9 F (36.6 C)    Resp 17    Ht 5\' 8"  (1.727 m)    Wt 73.9 kg    SpO2 98%    BMI 24.78 kg/m  Physical Exam Vitals and nursing [name] reviewed.  Constitutional:      General: He is not in acute distress.    Appearance: He is well-developed.  HENT:     Head: Normocephalic and atraumatic.  Eyes:     Conjunctiva/sclera: Conjunctivae normal.  Cardiovascular:     Rate and Rhythm: Normal rate and regular rhythm.     Heart sounds: No murmur heard. Pulmonary:     Effort: Pulmonary effort is normal. No respiratory distress.     Breath sounds: Normal breath sounds.  Abdominal:     Palpations: Abdomen is soft.     Tenderness: There is no abdominal tenderness.  Musculoskeletal:        General: No swelling.     Cervical back: Neck supple.     Right lower leg: No  edema.     Left lower leg: No edema.  Skin:    General: Skin is warm and dry.     Capillary Refill: Capillary refill takes less than 2 seconds.  Neurological:     General: No focal deficit present.     Mental Status: He is alert.  Psychiatric:     Comments: Calm and cooperative    ED Results / Procedures / Treatments   Labs (all labs ordered are listed, but only abnormal results are displayed) Labs Reviewed  BASIC METABOLIC PANEL - Abnormal; Notable for the following components:      Result Value   Glucose, Bld 307 (*)    BUN 38 (*)    Creatinine, Ser 2.55 (*)  Calcium 8.8 (*)    All other components within normal limits  CBC - Abnormal; Notable for the following components:   RBC 3.60 (*)    Hemoglobin 10.0 (*)    HCT 31.8 (*)    All other components within normal limits  SALICYLATE LEVEL - Abnormal; Notable for the following components:   Salicylate Lvl <4.4 (*)    All other components within normal limits  RAPID URINE DRUG SCREEN, HOSP PERFORMED - Abnormal; Notable for the following components:   Cocaine POSITIVE (*)    All other components within normal limits  ACETAMINOPHEN LEVEL - Abnormal; Notable for the following components:   Acetaminophen (Tylenol), Serum <10 (*)    All other components within normal limits  ETHANOL  TROPONIN I (HIGH SENSITIVITY)  TROPONIN I (HIGH SENSITIVITY)    EKG None  Radiology DG Chest 2 View  Result Date: 10/14/2021 CLINICAL DATA:  Chest pain history of heart surgery EXAM: CHEST - 2 VIEW COMPARISON:  10/09/2021, 10/07/2021 FINDINGS: Post sternotomy changes and atrial appendage clip. No focal opacity, pleural effusion or pneumothorax. Normal cardiomediastinal silhouette. Hardware in the cervical spine. IMPRESSION: No active cardiopulmonary disease. Electronically Signed   By: Donavan Foil M.D.   On: 10/14/2021 22:17    Procedures Procedures    Medications Ordered in ED Medications  acetaminophen (TYLENOL) tablet 500 mg (has  no administration in time range)    ED Course/ Medical Decision Making/ A&P                           Medical Decision Making  71 year old gentleman presenting to ER with concern for chest pain and suicidal ideation.  Regarding chest pain, patient has notable past history of CABG, frequent visits to ER for similar symptoms.  No change in pain today.  Also has history of cocaine abuse.  EKG without acute ischemic change and troponin noted to be borderline elevated, repeat troponin was 71 however subsequent repeat was normal.  Per review of chart, prior ER visits with our system or the [facility] system patient has chronically minimal elevation in troponin.  Given no EKG change, no significant rise in troponin, doubt ACS at present.  UDS noted for cocaine positive.  Feel that he is medically cleared for psychiatric evaluation.  Patient was evaluated by psychiatry overnight and they recommended evaluation today.  Shuvon Rankin with psychiatry evaluated patient this afternoon and I discussed the case with her, she recommended involving TOC to see if patient could be placed.  While awaiting TOC evaluation and further conversation with, signed out to Dr. Ashok Cordia.     Final Clinical Impression(s) / ED Diagnoses Final diagnoses:  Chronic chest pain  Suicidal ideation    Rx / DC Orders ED Discharge Orders     None         Lucrezia Starch, MD 10/15/21 1540

## 2021-10-15 NOTE — [facility] Notes (Signed)
CSW spoke with patient and provided homeless resources. CSW asked patient if he had medicaid or disability. Patient stated he has medicaid and gets $1500 a month. CSW asked patient why he can't use that money for a hotel room. Patient stated he got with the wrong people and all his money is gone. Patient stated he also noticed his card was gone. CSW asked patient when this happened and he said 2-3 days ago. CSW asked if he made a report to social security or Event organ[employer]. Patient stated no and could not give CSW a reason why. CSW had registration check his medicaid and its not currently active. Psychiatry told CSW they reached out to [facility] and that they have an open bed for patient. CSW spoke with Ms. Singletary with admissions who stated they have no medicaid beds and she also did not speak with anyone about this patient. CSW was also told patients medicare will not pay for room and board. Patient would have to pay out of pocket which is $5500 a month to go to [facility].

## 2021-10-15 NOTE — Discharge Instructions (Addendum)
It was our pleasure to provide your ER care today - we hope that you feel better.  Your blood pressure is high - make sure to take your blood pressure meds as prescribed, limit salt intake, avoid any cocaine use, and follow up closely with primary care doctor in the coming week.   For mental health issues and/or crisis, you may go directly to the behavioral health urgent care center - it is open 24/7 and walk-ins are welcome.  See resource guide provided for additional community resources or other outpatient behavioral health resources.   Follow up closely with primary care doctor and your cardiologist in the next 1-2 weeks.  Return to ER if worse, new symptoms, fevers, persistent or recurrent chest pain, increased trouble breathing, or other concern.

## 2021-10-15 NOTE — Discharge Planning (Signed)
TOC team consulted regarding [place] (ALF) placement for homeless pt.  Pt is unable to afford ALF and does not have states insurance assistance to help with payment.  TOC team left message for Aron Baba, [facility] SW Advocate for Vulnerable Population for additional resources other than the already homeless resource shelter resources provided.

## 2021-10-15 NOTE — ED Notes (Signed)
Pt provided lunch snack back as per request of Willette Cluster, MD

## 2021-10-15 NOTE — BH Assessment (Signed)
Clinician messaged Addison Naegeli, RN, "Hey. It's Trey with TTS. Is the pt able to engage in the assessment, if so the pt will need to be placed in a private room. Also is the pt under IVC?"   Clinician awaiting response.    Vertell Novak, [place], [facility], [facility] Triage Specialist [phone]

## 2021-10-15 NOTE — ED Notes (Signed)
Pt provided w/ sandwich, gingerale and bus pass prior to DC. Homeless resources included on paperwork

## 2021-10-15 NOTE — ED Provider Notes (Signed)
Patient has been psychiatric cleared for d/c by [facility] team.   Patient is alert, content, nad. Pt currently denies any pain or discomfort. No chest pain. No sob. No headache.   Bp is high, hx same. Will give dose of his bp meds.  TOC team consulted - they indicate not able to place into ECF/SNF.  Pt indicates he does have family in [place] area.   Pt currently has normal mood and affect. No thoughts of harm to self, or others. No SI.  Pt is not responding to internal stimuli - no acute psychosis.  Currently hr in 70s, rr is 16, and pulse ox is 99%.   Pt currently appears stable for d/c.  Rec avoidance cocaine use - resource guide provided for social services and other community resources, shelters, etc.   Also rec close outpatient pcp and bh f/u.  Return precautions provided.       Lajean Saver, MD 10/15/21 [phone]

## 2021-10-15 NOTE — BH Assessment (Signed)
Comprehensive Clinical Assessment (CCA) Note  10/15/2021 [name] [id number]  Disposition: Trinna Post, PA-C recommends pt is observed and reassessed by psychiatry. Disposition discussed with Addison Naegeli, RN.   [place] ED from 10/14/2021 in [place] ED from 10/11/2021 in [facility] ED from 10/09/2021 in [place] [facility]  C-SSRS RISK CATEGORY High Risk High Risk Error: Question 1 not populated       The patient demonstrates the following risk factors for suicide: Chronic risk factors for suicide include: psychiatric disorder of Major Depressive Disorder), recurrent episode, severe with psychotic features, previous suicide attempts Pt reports, cutting himself as a suicide attempt two years ago, and previous self-harm Pt cut himself two months ago . Acute risk factors for suicide include:  Homeless, pt is currently suicidal . Protective factors for this patient include:  None . Considering these factors, the overall suicide risk at this point appears to be high. Patient is appropriate for outpatient follow up.  Alyson Locket. Dimarco is a 71 year old male who presents voluntary and unaccompanied to [facility]. Clinician asked the pt, "what brought you to the hospital?" Pt reports, "not good, just sick." Clinician asked the pt if he's, "physically or mentally sick?" Pt replied, "a little bit of both." Clinician asked the pt, "what type of treatment is he seeking, pt replied, "a little everything I believe." Pt reports, he's suicidal because "I just got a lot of problems." Clinician asked the pt, if he had a plan pt replied, anything that happens." Clinician ask the pt if he would do something to kill himself, pt replied, "anything that happens." Pt reports, hearing people talking.  Pt reports, cutting himself as a suicide attempt two years ago. Pt denies, HI, current self-injurious behaviors and  access to weapons.   Pt reports, he smoked Crack Cocaine three days ago. Pt denies, being linked to OPT resources (medication management and/or counseling.)   Pt presents quiet, awake sitting in a chair. Pt's mood, affect was depressed. Pt's insight, judgement  was poor.   Diagnosis: Major Depressive Disorder), recurrent episode, severe with psychotic features.                   Cocaine use Disorder, severe.   Pt was assessed by clinician on 10/12/2021 at [facility]. Per assessment: "Alyson Locket. Viscomi is 71 year old who presents voluntary and unaccompanied. Clinician asked the pt, "what brought you to the hospital?" Pt reports, "everything." Pt reports, [place] the courthouse he called 911. Pt reports, he got to the point where wanting to die stays on his mind all the time. Pt denies, having a plan or intent. Pt reports, seeing a guy in a 10 gallon hat walk across the floor and tell him he's no good. Pt reports, he's been in [place] for about a month after getting out of the hospital in [place]. Pt reports, he was in and out of the hospital in [place] for health reasons; his doctors are in [place] but he moved to [place] because he didn't care for [place]. Pt also denies, HI, self-injurious behaviors and access to weapons.    Pt reports, he smoked Crack two weeks ago but cannot remember how much he used. Pt's UDS is pending. Pt denies, being linked to OPT resources (medication management and/or counseling.) Pt denies, taking psychotropic medications.   Pt presents with his eyes closed and a mask on with a blanket over him (stopping at his  neck.) Pt's mood, affect was depressed. Pt's insight was fair. Pt's judgement was poor. Pt reports, if discharged he can not contract for safety, he feels he will hurt himself however pt continues to denies having a plan and access to weapons."   Chief Complaint:  Chief Complaint  Patient presents with   Chest Pain   Suicidal   Visit  Diagnosis:     CCA Screening, Triage and Referral (STR)  Patient Reported Information How did you hear about [place]? Legal System (Pt called 911.)  What Is the Reason for Your Visit/Call Today? Per RN note: "Pt brought in by EMS for left side chest pain and suicidal thoughts. Pt states the suicidal thoughts were triggered by his declining health. Pt denies a plan."  How Long Has This Been Causing You Problems? > than 6 months  What Do You Feel Would Help You the Most Today? Treatment for Depression or other mood problem; Housing Assistance   Have You Recently Had Any Thoughts About Hurting Yourself? Yes  Are You Planning to Commit Suicide/Harm Yourself At This time? No   Have you Recently Had Thoughts About [place]? No  Are You Planning to Harm Someone at This Time? No  Explanation: No data recorded  Have You Used Any Alcohol or Drugs in the Past 24 Hours? Yes  How Long Ago Did You Use Drugs or Alcohol? No data recorded What Did You Use and How Much? Pt reports, smoking Crack Cocaine   Do You Currently Have a Therapist/Psychiatrist? No  Name of Therapist/Psychiatrist: No data recorded  Have You Been Recently Discharged From Any Office Practice or Programs? No  Explanation of Discharge From Practice/Program: No data recorded    CCA Screening Triage Referral Assessment Type of Contact: Tele-Assessment  Telemedicine Service Delivery: Telemedicine service delivery: This service was provided via telemedicine using a 2-way, interactive audio and video technology  Is this Initial or Reassessment? Initial Assessment  Date Telepsych consult ordered in [facility]:  10/14/21  Time Telepsych consult ordered in [facility]:  2029  Location of Assessment: [facility] ED  Provider Location: [facility] Assessment Services   Collateral Involvement: Pt declined for clinician to contact family and/or friends to obtain additional information. Pt denies, having supports.   Does Patient Have a  Stage manager Guardian? No data recorded Name and Contact of Legal Guardian: No data recorded If Minor and Not Living with Parent(s), Who has Custody? No data recorded Is CPS involved or ever been involved? Never  Is APS involved or ever been involved? Never   Patient Determined To Be At Risk for Harm To Self or Others Based on Review of Patient Reported Information or Presenting Complaint? Yes, for Self-Harm  Method: No data recorded Availability of Means: No data recorded Intent: No data recorded Notification Required: No data recorded Additional Information for Danger to Others Potential: No data recorded Additional Comments for Danger to Others Potential: No data recorded Are There Guns or Other Weapons in Your Home? No data recorded Types of Guns/Weapons: No data recorded Are These Weapons Safely Secured?                            No data recorded Who Could Verify You Are Able To Have These Secured: No data recorded Do You Have any Outstanding Charges, Pending Court Dates, Parole/Probation? 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 No  IVC Papers Initial File Date: No data recorded  [place] of Residence: [place]   Patient Currently Receiving the Following Services: Not Receiving Services   Determination of Need: Urgent (48 hours)   Options For Referral: [facility]     CCA Biopsychosocial Patient Reported Schizophrenia/Schizoaffective Diagnosis in Past: No   Strengths: No data recorded  Mental Health Symptoms Depression:   Fatigue; Hopelessness; Sleep (too much or little); Worthlessness; Tearfulness; Difficulty Concentrating; Irritability (Guilt, isolation.)   Duration of Depressive symptoms:    Mania:   None   Anxiety:    Sleep; Worrying; Tension; Difficulty concentrating   Psychosis:   Hallucinations   Duration of Psychotic symptoms:     Trauma:   None   Obsessions:   None   Compulsions:   None   Inattention:   Forgetful   Hyperactivity/Impulsivity:   None   Oppositional/Defiant Behaviors:   Angry   Emotional Irregularity:   Recurrent suicidal behaviors/gestures/threats; Mood lability   Other Mood/Personality Symptoms:  No data recorded   Mental Status Exam Appearance and self-care  Stature:   Average   Weight:   Average weight   Clothing:   -- (Pt had a blanket drapped over him.)   Grooming:   Normal   Cosmetic use:   None   Posture/gait:   Other (Comment) (Pt sitting in chair.)   Motor activity:   Not Remarkable   Sensorium  Attention:   Normal   Concentration:   Normal   Orientation:   X5   Recall/memory:   Normal   Affect and Mood  Affect:   Depressed   Mood:   Depressed   Relating  Eye contact:   Normal   Facial expression:   Depressed   Attitude toward examiner:   Cooperative   Thought and Language  Speech flow:  Soft   Thought content:   Appropriate to Mood and Circumstances   Preoccupation:   None   Hallucinations:   Auditory   Organization:  No data recorded  [employer] of Knowledge:   Average   Intelligence:   Average   Abstraction:   Normal   Judgement:   Poor   Reality Testing:   Adequate   Insight:   Fair   Decision Making:   Only simple   Social Functioning  Social Maturity:   Isolates   Social Judgement:   "Street Smart"   Stress  Stressors:   Housing; Illness   Coping Ability:   Overwhelmed; Deficient supports   Skill Deficits:   Decision making   Supports:   Support needed     Religion: Religion/Spirituality Are You A Religious Person?: No What is Your Religious Affiliation?: Methodist  Leisure/Recreation: Leisure / Recreation Do You Have Hobbies?: No  Exercise/Diet: Exercise/Diet Do You Exercise?: No Have You Gained or Lost A Significant Amount of Weight in the Past Six  Months?: No Number of Pounds Lost?:  (Per history, Pt recently lost 19 lbs.) Do You Follow a Special Diet?: No Do You Have Any Trouble Sleeping?: Yes Explanation of Sleeping Difficulties: Pt reports, getting 2 hours per night. Pt reports, he's homeless and sleeps outside on the streets.   CCA Employment/Education Employment/Work Situation: Employment / Work Situation Employment Situation: On disability Why is Patient on Disability: Pt reports, he got injured on the job. Pt has medical illnesses. How Long has Patient Been on Disability: Pt reports, he was injured on July 07, 1984.  Patient's Job has Been Impacted by Current Illness: No Has Patient ever Been in the [employer]?: No  Education: Education Last Grade Completed: 12 Did You Attend College?: No   CCA Family/Childhood History Family and Relationship History: Family history Marital status: Widowed Widowed, when?: Pt reports, his wife died two years ago. Does patient have children?: Yes How many children?: 2  Childhood History:  Childhood History By whom was/is the patient raised?: Both parents (Per chart.) Did patient suffer any verbal/emotional/physical/sexual abuse as a child?: No Did patient suffer from severe childhood neglect?: No Has patient ever been sexually abused/assaulted/raped as an adolescent or adult?: No Was the patient ever a victim of a crime or a disaster?: No Witnessed domestic violence?: No Has patient been affected by domestic violence as an adult?:  (NA)  Child/Adolescent Assessment:     CCA Substance Use Alcohol/Drug Use: Alcohol / Drug Use Pain Medications: See MAR Prescriptions: See MAR Over the Counter: See MAR Longest period of sobriety (when/how long): Per chart, two months. Negative Consequences of Use: Personal relationships, Financial Withdrawal Symptoms: None Substance #1 Name of Substance 1: Cocaine. 1 - Age of First Use: UTA 1 - Amount (size/oz): Pt is not sure. 1 -  Frequency: Ongoing. 1 - Duration: UTA 1 - Last Use / Amount: Per pt, three days ago. 1 - Method of Aquiring: Purchase. 1- Route of Use: Inhalation/smoke.     ASAM's:  Six Dimensions of Multidimensional Assessment  Dimension 1:  Acute Intoxication and/or Withdrawal Potential:      Dimension 2:  Biomedical Conditions and Complications:      Dimension 3:  Emotional, Behavioral, or Cognitive Conditions and Complications:     Dimension 4:  Readiness to Change:     Dimension 5:  Relapse, Continued use, or Continued Problem Potential:     Dimension 6:  Recovery/Living Environment:     ASAM Severity Score:    ASAM Recommended Level of Treatment:     Substance use Disorder (SUD)    Recommendations for Services/Supports/Treatments: Recommendations for Services/Supports/Treatments Recommendations For Services/Supports/Treatments: Other (Comment) (Pt to be observed and reassessed by psychiatry.)  Discharge Disposition:    DSM5 Diagnoses: Patient Active Problem List   Diagnosis Date Noted   Iron deficiency anemia 04/04/2021   Atypical chest pain 04/03/2021   Homelessness 12/13/2019   Cellulitis 12/07/2019   Osteomyelitis ([place]) 07/17/2019   Abscess or cellulitis of toe, right    Toe pain, right-second    MDD (major depressive disorder), recurrent episode, severe ([place]) 06/15/2019   Respiratory distress 04/12/2019   Pneumonia due to severe acute respiratory syndrome coronavirus 2 (SARS-CoV-2) 04/11/2019   COVID-19 virus infection 03/19/2019   Chest pain 10/03/2018   Malnutrition of moderate degree 09/21/2018   MDD (major depressive disorder), recurrent episode, moderate ([name])    Type II diabetes mellitus with renal manifestations ([place]) 05/04/2018   GERD (gastroesophageal reflux disease) 05/04/2018   S/P CABG (coronary artery bypass graft)    Left testicular pain    Depression 02/20/2018   Epididymo-orchitis, acute 02/20/2018   Essential hypertension 02/20/2018   Precordial  chest pain 02/20/2018   AKI (acute kidney injury) ([place]) 02/14/2018   HCAP (healthcare-associated pneumonia) 02/14/2018   History of pulmonary embolism 07/04/2017   Acute hyponatremia 05/11/2017   Hyperglycemia due to type 2 diabetes mellitus ([place]) 05/11/2017   CHF (congestive heart failure) ([place]) 04/01/2017   Hepatitis C 12/30/2016   Substance induced mood disorder ([place]) 11/23/2016   Chronic ischemic heart disease 11/12/2016   Paroxysmal  A-fib ([place]) 11/12/2016   Back pain 04/18/2016   S/P carotid endarterectomy 11/15/2015   MDD (major depressive disorder), recurrent severe, without psychosis ([place]) 09/09/2015   Cocaine-induced mood disorder ([place]) 08/14/2015   Cocaine abuse with cocaine-induced mood disorder ([place]) 08/14/2015   Gout 07/10/2015   Acute renal failure superimposed on stage 3 chronic kidney disease ([place]) 03/06/2015   Anemia 03/06/2015   Chronic kidney disease, stage III (moderate) ([place]) 03/06/2015   Hypoglycemia    Encounter for general adult medical examination with abnormal findings 02/09/2015   Cocaine use disorder, severe, dependence ([place]) 12/13/2014   Substance or medication-induced depressive disorder with onset during withdrawal ([place]) 12/13/2014   Severe recurrent major depressive disorder with psychotic features ([place]) 12/12/2014   Cervicalgia 06/28/2014   Lumbar radiculopathy, chronic 06/28/2014   Asthma, chronic 02/03/2014   3-vessel CAD 06/24/2013   ED (erectile dysfunction) of organic origin 07/07/2012   Hypertension goal BP (blood pressure) < 140/80 04/29/2012   Chondromalacia of left knee 03/19/2012   Hyperlipidemia with target LDL less than 100 02/12/2012   Fibromyalgia 02/12/2012   HIV (human immunodeficiency virus infection) ([place]) 08/27/2011   Uncontrolled type 2 diabetes with neuropathy 10/17/2000     Referrals to Alternative Service(s): Referred to Alternative Service(s):   Place:   Date:   Time:    Referred to Alternative Service(s):   Place:    Date:   Time:    Referred to Alternative Service(s):   Place:   Date:   Time:    Referred to Alternative Service(s):   Place:   Date:   Time:     Vertell Novak, [facility] Comprehensive Clinical Assessment (CCA) Screening, Triage and Referral Note  10/15/2021 Torrez Renfroe [id number]  Chief Complaint:  Chief Complaint  Patient presents with   Chest Pain   Suicidal   Visit Diagnosis:   Patient Reported Information How did you hear about [place]? Legal System (Pt called 911.)  What Is the Reason for Your Visit/Call Today? Per RN note: "Pt brought in by EMS for left side chest pain and suicidal thoughts. Pt states the suicidal thoughts were triggered by his declining health. Pt denies a plan."  How Long Has This Been Causing You Problems? > than 6 months  What Do You Feel Would Help You the Most Today? Treatment for Depression or other mood problem; Housing Assistance   Have You Recently Had Any Thoughts About Hurting Yourself? Yes  Are You Planning to Commit Suicide/Harm Yourself At This time? No   Have you Recently Had Thoughts About [place]? No  Are You Planning to Harm Someone at This Time? No  Explanation: No data recorded  Have You Used Any Alcohol or Drugs in the Past 24 Hours? Yes  How Long Ago Did You Use Drugs or Alcohol? No data recorded What Did You Use and How Much? Pt reports, smoking Crack Cocaine   Do You Currently Have a Therapist/Psychiatrist? No  Name of Therapist/Psychiatrist: No data recorded  Have You Been Recently Discharged From Any Office Practice or Programs? No  Explanation of Discharge From Practice/Program: No data recorded   CCA Screening Triage Referral Assessment Type of Contact: Tele-Assessment  Telemedicine Service Delivery: Telemedicine service delivery: This service was provided via telemedicine using a 2-way, interactive audio and video technology  Is this Initial or Reassessment? Initial Assessment  Date  Telepsych consult ordered in [facility]:  10/14/21  Time Telepsych consult ordered in [facility]:  2029  Location of Assessment: [facility] ED  Provider Location: [facility] Assessment Services   Collateral Involvement: Pt declined for clinician to contact family and/or friends to obtain additional information. Pt denies, having supports.   Does Patient Have a Stage manager Guardian? No data recorded Name and Contact of Legal Guardian: No data recorded If Minor and Not Living with Parent(s), Who has Custody? No data recorded Is CPS involved or ever been involved? Never  Is APS involved or ever been involved? Never   Patient Determined To Be At Risk for Harm To Self or Others Based on Review of Patient Reported Information or Presenting Complaint? Yes, for Self-Harm  Method: No data recorded Availability of Means: No data recorded Intent: No data recorded Notification Required: No data recorded Additional Information for Danger to Others Potential: No data recorded Additional Comments for Danger to Others Potential: No data recorded Are There Guns or Other Weapons in Your Home? No data recorded Types of Guns/Weapons: No data recorded Are These Weapons Safely Secured?                            No data recorded Who Could Verify You Are Able To Have These Secured: No data recorded Do You Have any Outstanding Charges, Pending Court Dates, Parole/Probation? No data recorded Contacted To Inform of Risk of Harm To Self or Others: Law Enforcement   Does Patient Present under Involuntary Commitment? No  IVC Papers Initial File Date: No data recorded  [place] of Residence: [place]   Patient Currently Receiving the Following Services: Not Receiving Services   Determination of Need: Urgent (48 hours)   Options For Referral: [facility]   Discharge Disposition:     Vertell Novak, [place], [place], [facility] [place], [facility] Triage Specialist [phone]

## 2021-10-15 NOTE — ED Notes (Signed)
Patient provided with breakfast at bedside.

## 2021-10-15 NOTE — Progress Notes (Signed)
Inpatient Diabetes Program Recommendations  AACE/ADA: New Consensus Statement on Inpatient Glycemic Control (2015)  Target Ranges:  Prepandial:   less than 140 mg/dL      Peak postprandial:   less than 180 mg/dL (1-2 hours)      Critically ill patients:  140 - 180 mg/dL   Lab Results  Component Value Date   GLUCAP 260 (H) 10/08/2021   HGBA1C 8.5 (H) 04/25/2021    Review of Glycemic Control  Diabetes history: DM 2 Outpatient Diabetes medications: Lantus 30 units and Humalog 4 units tid in the past Current orders for Inpatient glycemic control: None being evaluated in ED  Inpatient Diabetes Program Recommendations:    -  Add CBGs ACHS - Add Novolog 0-15 units tid + hs (use glycemic control order set)   Thanks,  Tama Headings RN, MSN, BC-ADM Inpatient Diabetes Coordinator Team Pager [phone] (8a-5p)

## 2021-10-15 NOTE — Consult Note (Signed)
Michael [name], 71 y.o., male patient seen via tele health by this provider, consulted with Michael [name]; and chart reviewed on 10/15/21.  On evaluation Michael [name] reports that he was having some passive suicidal thoughts but no intent or plan.  Patient reporting that his stressors are being homeless and having multiple medical problems and feeling that he is unable to care for himself on the street.  Patient does have a chronic history of polysubstance abuse also.  Patient stating that he has been trying to find a family care home or assistant living facility but has been having difficulty and find a 1.  Patient reports he has been to [facility] and has been told that they were unable to find any facilities with open beds.  At this time patient is able to contract for safety.  He denies suicidal ideations, psychosis, paranoia, and homicidal ideation.  Informed patient we will put in a social work consult to see if they could assist with any placement. During evaluation Michael [name] is sitting up in bed in no acute distress.  He is alert, oriented x 4, calm and cooperative.  His mood is depressed with congruent affect.  He does not appear to be responding to internal/external stimuli or delusional thoughts.  Patient denies suicidal/self-harm/homicidal ideation, psychosis, and paranoia.  Patient answered question appropriately.  Social work Scientific laboratory technician ordered:  Patient reporting only having suicidal thoughts related to being homeless and having multiple medical problems.  States he has been trying to find a place to stay but has been unable to.   [facility] [address] [place], [place] [phone] Has a bed available if want to contact to come interviews or he will just keep coming to ED because he is homeless and states he is unable to care for himself.   Social work responded:  TOC cannot provide housing for patient. The emergency room also does not hold  patients for assisted living placement. Patient has no payor source to go to an assisted living. He would have to pay 3,000-10,000 a month. Patient can be given homeless resources.  Secure message sent to social work:I am not asking anyone to provide housing for this patient.  I am simple stating that patient is reporting that his depression and passive suicidal thoughts related to being homeless and unable to care for himself.  He is reporting multiple medication problems and that he can no longer care for himself on the street.  He is 56 yrs. old and homeless, has no family or support.  I sent in the information to a family care home that has a bed open, and the administrator would come to ED to [place] patient.  I can't send information to her related to patient confidentiality, so I put in a social work consult.   If social work is unable to assist.  Patient doesn't meet criteria for inpatient psychiatric treatment.  Passive SI with no intent or plan.  Also denies auditory/visual hallucinations and paranoia.  Don't feel that this 71 year old male should just be discharged to the street.  Sorry just don't know what can be done.  This is the place that has a bed available and only have older men in her home.  [facility] [address] [place], [place] [phone]   Recommendation: Outpatient psychiatric services.  Patient to follow-up for medication management and counseling.  Disposition: No evidence of imminent risk to  self or others at present.   Patient does not meet criteria for psychiatric inpatient admission. Supportive therapy provided about ongoing stressors. Refer to IOP. Discussed crisis plan, support from social network, calling 911, coming to the Emergency Department, and calling Suicide Hotline.  Michael Siegel B. Michael Legere, NP

## 2021-10-24 ENCOUNTER — Other Ambulatory Visit: Payer: Self-pay | Admitting: Critical Care Medicine

## 2021-10-24 ENCOUNTER — Encounter: Payer: Self-pay | Admitting: Physician Assistant

## 2021-10-24 ENCOUNTER — Other Ambulatory Visit (HOSPITAL_COMMUNITY): Payer: Self-pay

## 2021-10-24 DIAGNOSIS — I259 Chronic ischemic heart disease, unspecified: Secondary | ICD-10-CM

## 2021-10-24 DIAGNOSIS — M86271 Subacute osteomyelitis, right ankle and foot: Secondary | ICD-10-CM

## 2021-10-24 DIAGNOSIS — E11621 Type 2 diabetes mellitus with foot ulcer: Secondary | ICD-10-CM

## 2021-10-24 DIAGNOSIS — Z951 Presence of aortocoronary bypass graft: Secondary | ICD-10-CM

## 2021-10-24 DIAGNOSIS — S98111A Complete traumatic amputation of right great toe, initial encounter: Secondary | ICD-10-CM

## 2021-10-24 DIAGNOSIS — E1122 Type 2 diabetes mellitus with diabetic chronic kidney disease: Secondary | ICD-10-CM

## 2021-10-24 DIAGNOSIS — N1831 Chronic kidney disease, stage 3a: Secondary | ICD-10-CM

## 2021-10-24 DIAGNOSIS — B20 Human immunodeficiency virus [HIV] disease: Secondary | ICD-10-CM

## 2021-10-24 DIAGNOSIS — I2510 Atherosclerotic heart disease of native coronary artery without angina pectoris: Secondary | ICD-10-CM

## 2021-10-24 DIAGNOSIS — Z21 Asymptomatic human immunodeficiency virus [HIV] infection status: Secondary | ICD-10-CM

## 2021-10-24 DIAGNOSIS — Z794 Long term (current) use of insulin: Secondary | ICD-10-CM

## 2021-10-24 MED ORDER — INSULIN GLARGINE 100 UNIT/ML SOLOSTAR PEN
30.0000 [IU] | PEN_INJECTOR | Freq: Every day | SUBCUTANEOUS | 1 refills | Status: DC
  Filled 2021-10-24 (×2): qty 15, 50d supply, fill #0

## 2021-10-24 MED ORDER — INSULIN LISPRO (1 UNIT DIAL) 100 UNIT/ML (KWIKPEN)
4.0000 [IU] | PEN_INJECTOR | Freq: Three times a day (TID) | SUBCUTANEOUS | 0 refills | Status: DC
Start: 2021-10-24 — End: 2022-09-06
  Filled 2021-10-24: qty 9, 75d supply, fill #0

## 2021-10-24 MED ORDER — ISOSORBIDE MONONITRATE ER 60 MG PO TB24
120.0000 mg | ORAL_TABLET | Freq: Every day | ORAL | 2 refills | Status: DC
  Filled 2021-10-24: qty 60, 30d supply, fill #0

## 2021-10-24 MED ORDER — HYDRALAZINE HCL 25 MG PO TABS
75.0000 mg | ORAL_TABLET | Freq: Three times a day (TID) | ORAL | 1 refills | Status: DC
  Filled 2021-10-24: qty 270, 30d supply, fill #0

## 2021-10-24 MED ORDER — LANTUS SOLOSTAR 100 UNIT/ML ~~LOC~~ SOPN
30.0000 [IU] | PEN_INJECTOR | Freq: Every day | SUBCUTANEOUS | 3 refills | Status: DC
Start: 2021-10-24 — End: 2022-07-05
  Filled 2021-10-24: qty 9, 30d supply, fill #0

## 2021-10-24 MED ORDER — PANTOPRAZOLE SODIUM 40 MG PO TBEC
40.0000 mg | DELAYED_RELEASE_TABLET | Freq: Every day | ORAL | 2 refills | Status: DC | PRN
Start: 2021-10-24 — End: 2022-09-06
  Filled 2021-10-24: qty 30, 30d supply, fill #0

## 2021-10-24 MED ORDER — LIDOCAINE 5 % EX PTCH
1.0000 | MEDICATED_PATCH | Freq: Every day | CUTANEOUS | 0 refills | Status: DC | PRN
  Filled 2021-10-24: qty 30, 30d supply, fill #0

## 2021-10-24 MED ORDER — AMLODIPINE BESYLATE 10 MG PO TABS
10.0000 mg | ORAL_TABLET | Freq: Every day | ORAL | 2 refills | Status: DC
  Filled 2021-10-24: qty 60, 60d supply, fill #0

## 2021-10-24 MED ORDER — BUPROPION HCL ER (XL) 150 MG PO TB24
150.0000 mg | ORAL_TABLET | Freq: Every morning | ORAL | 3 refills | Status: DC
  Filled 2021-10-24: qty 30, 30d supply, fill #0

## 2021-10-24 MED ORDER — BICTEGRAVIR-EMTRICITAB-TENOFOV 50-200-25 MG PO TABS
1.0000 | ORAL_TABLET | Freq: Every day | ORAL | 11 refills | Status: DC
  Filled 2021-10-24: qty 30, 30d supply, fill #0

## 2021-10-24 MED ORDER — NITROGLYCERIN 0.4 MG SL SUBL
0.4000 mg | SUBLINGUAL_TABLET | SUBLINGUAL | 0 refills | Status: DC | PRN
Start: 2021-10-24 — End: 2022-12-09
  Filled 2021-10-24: qty 25, 8d supply, fill #0

## 2021-10-24 MED ORDER — BUDESONIDE-FORMOTEROL FUMARATE 160-4.5 MCG/ACT IN AERO
2.0000 | INHALATION_SPRAY | Freq: Two times a day (BID) | RESPIRATORY_TRACT | 2 refills | Status: DC
  Filled 2021-10-24: qty 10.2, 30d supply, fill #0

## 2021-10-24 MED ORDER — ALBUTEROL SULFATE HFA 108 (90 BASE) MCG/ACT IN AERS
1.0000 | INHALATION_SPRAY | Freq: Four times a day (QID) | RESPIRATORY_TRACT | 1 refills | Status: DC | PRN
Start: 2021-10-24 — End: 2022-09-06
  Filled 2021-10-24: qty 8.5, 25d supply, fill #0

## 2021-10-24 MED ORDER — INSULIN PEN NEEDLE 31G X 5 MM MISC
1 refills | Status: DC
Start: 2021-10-24 — End: 2022-12-09
  Filled 2021-10-24: qty 100, 25d supply, fill #0

## 2021-10-24 MED ORDER — TRAZODONE HCL 50 MG PO TABS
50.0000 mg | ORAL_TABLET | Freq: Every evening | ORAL | 3 refills | Status: DC | PRN
  Filled 2021-10-24: qty 30, 30d supply, fill #0

## 2021-10-24 NOTE — Progress Notes (Signed)
Pt seen by Dr Joya Gaskins.  He has been having chest pain since his CABG 2019 with LIMA-LAD, SVG-OM2, SVG-PDA, with bilat pulm vein isolation and LAA clip.   Multiple ER visits for chest pain, suicidal ideation.  He had an ulcer on his toe, R great  partly amputated and 2nd toe removed.  Since then he has had problems w/ balance. All this was done in [place]. Needs to go to the [facility] >> referral made.   He has a PCP in [place], but has not seen her recently. He only saw her once. F/u with Dr Joya Gaskins   He has been having sharp chest pain since the surgery. There are some tender areas on his chest on either side of the sternum. No point tenderness. He was on Voltaren gel and lidocaine patches, thinks the patches helped more  He needs to see the foot doctor, has a sore on his foot >. Referral made  During a recent ER visit, he was +cocaine, trop 71,but came down.   172/92, 76, 96% O2, is wheezing, will get an inhaler.  He was on Xarelto at one point for PAF. F/u with Dr Sallyanne Kuster >> referral made.   He has been out of all of his meds for a while, including HIV meds. HIV meds >> restarted ID referral made  Prior to Admission medications   Medication Sig  albuterol (VENTOLIN HFA) 108 (90 Base) MCG/ACT inhaler Inhale 1-2 puffs into the lungs every 6 (six) hours as needed for wheezing or shortness of breath. Patient not taking: Reported on 10/15/2021  amLODipine (NORVASC) 10 MG tablet Take 1 tablet (10 mg total) by mouth daily. Patient not taking: Reported on 10/15/2021  aspirin EC 81 MG tablet Take 81 mg by mouth daily. Swallow whole. Patient not taking: Reported on 10/15/2021  bictegravir-emtricitabine-tenofovir AF (BIKTARVY) 50-200-25 MG TABS tablet Take 1 tablet by mouth daily. Patient not taking: Reported on 10/15/2021  budesonide-formoterol (SYMBICORT) 160-4.5 MCG/ACT inhaler Inhale 2 puffs into the lungs in the morning and at bedtime. Patient not taking: Reported on 10/15/2021  buPROPion (WELLBUTRIN  XL) 150 MG 24 hr tablet Take 150 mg by mouth every morning. Patient not taking: Reported on 10/15/2021  diclofenac Sodium (VOLTAREN) 1 % GEL Apply 2 g topically 2 (two) times daily as needed (for pain in shoulders, neck, and knees). Patient not taking: Reported on 10/15/2021  hydrALAZINE (APRESOLINE) 25 MG tablet Take 75 mg by mouth 3 (three) times daily. Patient not taking: Reported on 10/15/2021  insulin glargine (LANTUS) 100 UNIT/ML Solostar Pen Inject 30 Units into the skin daily. Patient not taking: Reported on 10/15/2021  insulin lispro (HUMALOG KWIKPEN) 100 UNIT/ML KwikPen Inject 0.04 mLs (4 Units total) into the skin 3 (three) times daily with meals. Patient not taking: Reported on 10/15/2021  Insulin Pen Needle 31G X 5 MM MISC 4 Units by Does not apply route in the morning, at noon, and at bedtime.  isosorbide mononitrate (IMDUR) 60 MG 24 hr tablet Take 2 tablets (120 mg total) by mouth daily. Patient not taking: Reported on 10/15/2021  lidocaine (LIDODERM) 5 % Place 1 patch onto the skin daily as needed (For chestwall pain). Remove & Discard patch within 12 hours or as directed by MD Patient not taking: Reported on 10/15/2021  mirtazapine (REMERON) 15 MG tablet Take 15 mg by mouth at bedtime as needed (sleep). Patient not taking: Reported on 10/15/2021  nitroGLYCERIN (NITROSTAT) 0.4 MG SL tablet Place 1 tablet (0.4 mg total) under the tongue  every 5 (five) minutes as needed for chest pain.  pantoprazole (PROTONIX) 40 MG tablet Take 40 mg by mouth daily as needed (for heartburn). Patient not taking: Reported on 10/15/2021  traZODone (DESYREL) 50 MG tablet Take 50 mg by mouth at bedtime as needed for sleep. Patient not taking: Reported on 10/15/2021  XARELTO 15 MG TABS tablet Take 15 mg by mouth daily. Patient not taking: Reported on 10/15/2021   Past Medical History:  Diagnosis Date   A-fib ([place])    Asthma    No PFTs, history of childhood asthma   CAD (coronary artery disease)    a. 06/2013  STEMI/PCI ([facility]): LAD w/ thrombus (treated with BMS), mid 75%, D2 75%; LCX OM2 75%; RCA small, PDA 95%, PLV 95%;  b. 10/2013 Cath/PCI: ISR w/in LAD (Promus DES x 2), borderline OM2 lesion;  c. 01/2014 MV: Intermediate risk, medium-sized distal ant wall infarct w/ very small amt of peri-infarct ischemia. EF 60%.   Cellulitis 04/2014   left facial   Cellulitis and abscess of toe of right foot 12/08/2019   Chondromalacia of medial femoral condyle    Left knee MRI 04/28/12: Chondromalacia of the medial femoral condyle with slight peripheral degeneration of the meniscocapsular junction of the medial meniscus; followed by sports medicine   Collagen vascular disease ([place])    Crack cocaine use    for 20+ years, has been enrolled in detox programs in the past   Depression    with history of hospitalization for suicidal ideation   Diabetes mellitus 2002   Diagnosed in 2002, started insulin in 2012   Gout    Gout 04/28/2012   Headache(784.0)    CT head 08/2011: Periventricular and subcortical white matter hypodensities are most in keeping with chronic microangiopathic change   HIV [facility]) Nov 2012   Followed by [name]   Hyperlipidemia    Hypertension    Pulmonary embolism [facility])    CARDIAC CMarked catheter dampening with engagement of LAD - small / short LM - LCX with obstructive OM1, native LCX ok. LAD with marked dampening and ST dep in ostial LAD - right at stent - 70% with diffuse 30% ISR.   RCA with serial high grade lesions mid/distal RCA with R-L collaterals to LAD.  ATH: 01/22/2018  Meds restarted: ASA 81 mg , amlodipine 10 mg qd, alb MDI, Biktarvy, isosorbide bid, glargine ins 30 u qd, lispro 4 u tid, hydralazine tid, lidocaine patch, pantoprazole, trazodone qhs  Rosaria Ferries, PA-C 10/24/2021 4:23 PM

## 2021-10-25 ENCOUNTER — Other Ambulatory Visit (HOSPITAL_COMMUNITY): Payer: Self-pay

## 2021-10-31 ENCOUNTER — Emergency Department (HOSPITAL_COMMUNITY): Payer: Medicare Other

## 2021-10-31 ENCOUNTER — Emergency Department (HOSPITAL_COMMUNITY)
Admission: EM | Admit: 2021-10-31 | Discharge: 2021-11-01 | Disposition: A | Discharge status: Home / Self Care | Payer: Medicare Other | Attending: Emergency Medicine | Admitting: Emergency Medicine

## 2021-10-31 ENCOUNTER — Encounter (HOSPITAL_COMMUNITY): Payer: Self-pay

## 2021-10-31 DIAGNOSIS — I129 Hypertensive chronic kidney disease with stage 1 through stage 4 chronic kidney disease, or unspecified chronic kidney disease: Secondary | ICD-10-CM | POA: Diagnosis not present

## 2021-10-31 DIAGNOSIS — R42 Dizziness and giddiness: Secondary | ICD-10-CM | POA: Insufficient documentation

## 2021-10-31 DIAGNOSIS — R079 Chest pain, unspecified: Secondary | ICD-10-CM | POA: Diagnosis not present

## 2021-10-31 DIAGNOSIS — Z21 Asymptomatic human immunodeficiency virus [HIV] infection status: Secondary | ICD-10-CM | POA: Diagnosis not present

## 2021-10-31 DIAGNOSIS — Z951 Presence of aortocoronary bypass graft: Secondary | ICD-10-CM | POA: Diagnosis not present

## 2021-10-31 DIAGNOSIS — Z79899 Other long term (current) drug therapy: Secondary | ICD-10-CM | POA: Insufficient documentation

## 2021-10-31 DIAGNOSIS — Z7982 Long term (current) use of aspirin: Secondary | ICD-10-CM | POA: Insufficient documentation

## 2021-10-31 DIAGNOSIS — N189 Chronic kidney disease, unspecified: Secondary | ICD-10-CM | POA: Insufficient documentation

## 2021-10-31 LAB — BASIC METABOLIC PANEL
Anion gap: 7 (ref 5–15)
BUN: 37 mg/dL — ABNORMAL HIGH (ref 8–23)
CO2: 20 mmol/L — ABNORMAL LOW (ref 22–32)
Calcium: 8.5 mg/dL — ABNORMAL LOW (ref 8.9–10.3)
Chloride: 112 mmol/L — ABNORMAL HIGH (ref 98–111)
Creatinine, Ser: 2.07 mg/dL — ABNORMAL HIGH (ref 0.61–1.24)
GFR, Estimated: 34 mL/min — ABNORMAL LOW (ref >60)
Glucose, Bld: 235 mg/dL — ABNORMAL HIGH (ref 70–99)
Potassium: 4.4 mmol/L (ref 3.5–5.1)
Sodium: 139 mmol/L (ref 135–145)

## 2021-10-31 LAB — CBC
HCT: 29.3 % — ABNORMAL LOW (ref 39.0–52.0)
Hemoglobin: 9.2 g/dL — ABNORMAL LOW (ref 13.0–17.0)
MCH: 27.5 pg (ref 26.0–34.0)
MCHC: 31.4 g/dL (ref 30.0–36.0)
MCV: 87.5 fL (ref 80.0–100.0)
Platelets: 161 10*3/uL (ref 150–400)
RBC: 3.35 MIL/uL — ABNORMAL LOW (ref 4.22–5.81)
RDW: 12.7 % (ref 11.5–15.5)
WBC: 4.9 10*3/uL (ref 4.0–10.5)
nRBC: 0 % (ref 0.0–0.2)

## 2021-10-31 LAB — TROPONIN I (HIGH SENSITIVITY)
Troponin I (High Sensitivity): 20 ng/L — ABNORMAL HIGH (ref <18)
Troponin I (High Sensitivity): 23 ng/L — ABNORMAL HIGH (ref <18)

## 2021-10-31 MED ORDER — ONDANSETRON 4 MG PO TBDP: 4.0000 mg | ORAL_TABLET | Freq: Once | ORAL | Status: DC

## 2021-10-31 NOTE — ED Provider Triage Note (Signed)
Emergency Medicine Provider Triage Evaluation Note  Michael [name] , a 71 y.o. male  was evaluated in triage.  Pt complains of worsening chest pain, sob. Reports constant since triple bypass but worse for last few hours. Some nausea. No syncope, diaphoresis.  Review of Systems  Positive: Chest pain, sob Negative: palpitations  Physical Exam  BP (!) 163/87 (BP Location: Right Arm)    Pulse 94    Temp 98.7 F (37.1 C) (Oral)    Resp 17    SpO2 98%  Gen:   Awake, no distress   Resp:  Normal effort  MSK:   Moves extremities without difficulty  Other:  No accessory breath sounds, no ttp chest wall  Medical Decision Making  Medically screening exam initiated at 7:41 PM.  Appropriate orders placed.  [name] was informed that the remainder of the evaluation will be completed by another provider, this initial triage assessment does not replace that evaluation, and the importance of remaining in the ED until their evaluation is complete.  Workup initiated   Anselmo Pickler, [place] 10/31/21 1942

## 2021-10-31 NOTE — ED Triage Notes (Signed)
Pt comes via [facility] for CP, central, radiation to back, worse with movement, PTA 324 ASA and one nitro without change. Hx of CABG

## 2021-11-01 ENCOUNTER — Ambulatory Visit: Payer: Medicare Other | Admitting: Internal Medicine

## 2021-11-01 ENCOUNTER — Emergency Department (HOSPITAL_COMMUNITY): Payer: Medicare Other

## 2021-11-01 NOTE — ED Provider Notes (Signed)
[facility] EMERGENCY DEPARTMENT Provider Note   CSN: [id number] Arrival date & time: 10/31/21  1923     History  Chief Complaint  Patient presents with   Chest Pain    Michael [name] is a 71 y.o. male.  Patient is a 71 year old male with multiple medical problems including CKD, hypertension, prior MI status post CABG, chronic chest pain, HIV, homelessness currently living in a shelter who is presenting today with several complaints.  Patient initially told them in the front about the chest pain he is having however when he further expands this is a chronic issue that has been occurring since he had his CABG.  It is just sharp type of pain that is always in his chest but he feels more in his left shoulder.  It is worse with certain movements and with taking a deep breath.  He sometimes feels winded but all of these are symptoms that he states he has had for a long time.  He does use inhalers and he does use lidocaine patches for this.  However he reports that what really concerned him was yesterday he was feeling unstable on his feet and even fell 1 time.  He complains of feeling dizzy and reports that he feels like he sees more than 1 thing in his vision.  It makes him feel off balance and he has to hold onto something to walk.  He does normally use a cane and reports he has had some balance issues since he had amputation of his toe last year but this felt different.  He denies any headache today.  He does not think he has had any new medication changes but he reports the medication does make him feel bad so sometimes he skips it.  He still reports feeling dizzy here but seems to be more nebulous he has a difficult time describing it.  He does not feel like he is lightheaded or going to pass out currently.  He has not had any fever, cough or infectious symptoms.  The history is provided by the patient and medical records.  Chest Pain     Home Medications Prior to  Admission medications   Medication Sig Start Date End Date Taking? Authorizing Provider  albuterol (VENTOLIN HFA) 108 (90 Base) MCG/ACT inhaler Inhale 1-2 puffs into the lungs every 6 (six) hours as needed for wheezing or shortness of breath. 10/24/21   [name], MD  amLODipine (NORVASC) 10 MG tablet Take 1 tablet (10 mg total) by mouth daily. 10/24/21   [name], MD  aspirin EC 81 MG tablet Take 81 mg by mouth daily. Swallow whole. Patient not taking: Reported on 10/15/2021    [provider]  bictegravir-emtricitabine-tenofovir AF (BIKTARVY) 50-200-25 MG TABS tablet Take 1 tablet by mouth daily. 10/24/21   [name], MD  budesonide-formoterol [facility]) 160-4.5 MCG/ACT inhaler Inhale 2 puffs into the lungs in the morning and at bedtime. 10/24/21   [name], MD  buPROPion (WELLBUTRIN XL) 150 MG 24 hr tablet Take 1 tablet (150 mg total) by mouth every morning. 10/24/21   [name], MD  hydrALAZINE (APRESOLINE) 25 MG tablet Take 3 tablets (75 mg total) by mouth 3 (three) times daily. 10/24/21 12/23/21  [name], MD  insulin glargine (LANTUS SOLOSTAR) 100 UNIT/ML Solostar Pen Inject 30 Units into the skin daily. 10/24/21   [name], MD  insulin lispro (HUMALOG KWIKPEN) 100 UNIT/ML KwikPen Inject 4 Units  into the skin 3 (three) times daily with meals. 10/24/21   [name], MD  Insulin Pen Needle 31G X 5 MM MISC Use with insulin pens 10/24/21   [name], MD  isosorbide mononitrate (IMDUR) 60 MG 24 hr tablet Take 2 tablets (120 mg total) by mouth daily. 10/24/21   [name], MD  lidocaine (LIDODERM) 5 % Place 1 patch onto the skin daily as needed (For chestwall pain). Remove & Discard patch within 12 hours or as directed by MD 10/24/21   [name], MD  nitroGLYCERIN (NITROSTAT) 0.4 MG SL tablet Place 1 tablet (0.4 mg total) under the tongue every 5 (five) minutes as needed for chest pain. 10/24/21   [name],  MD  pantoprazole (PROTONIX) 40 MG tablet Take 1 tablet (40 mg total) by mouth daily as needed (for heartburn). 10/24/21   [name], MD  traZODone (DESYREL) 50 MG tablet Take 1 tablet (50 mg total) by mouth at bedtime as needed for sleep. 10/24/21   [name], MD      Allergies    Patient has no known allergies.    Review of Systems   Review of Systems  Cardiovascular:  Positive for chest pain.   Physical Exam Updated Vital Signs BP (!) 172/101    Pulse 61    Temp 98 F (36.7 C) (Oral)    Resp 14    SpO2 100%  Physical Exam Vitals and nursing note reviewed.  Constitutional:      General: He is not in acute distress.    Appearance: He is well-developed.  HENT:     Head: Normocephalic and atraumatic.     Right Ear: Tympanic membrane normal.     Left Ear: Tympanic membrane normal.     Mouth/Throat:     Mouth: Mucous membranes are moist.  Eyes:     General: No visual field deficit.    Conjunctiva/sclera: Conjunctivae normal.     Pupils: Pupils are equal, round, and reactive to light.     Comments: No nystagmus  Cardiovascular:     Rate and Rhythm: Normal rate and regular rhythm.     Heart sounds: No murmur heard. Pulmonary:     Effort: Pulmonary effort is normal. No respiratory distress.     Breath sounds: Normal breath sounds. No wheezing or rales.  Chest:     Chest wall: Tenderness present.  Abdominal:     General: There is no distension.     Palpations: Abdomen is soft.     Tenderness: There is no abdominal tenderness. There is no guarding or rebound.  Musculoskeletal:        General: No tenderness. Normal range of motion.     Cervical back: Normal range of motion and neck supple.  Skin:    General: Skin is warm and dry.     Findings: No erythema or rash.  Neurological:     Mental Status: He is alert and oriented to person, place, and time.     Cranial Nerves: No dysarthria or facial asymmetry.     Sensory: No sensory deficit.     Motor: Motor  function is intact. No weakness or pronator drift.     Coordination: Coordination normal. Finger-Nose-Finger Test and Heel to Shin Test normal.  Psychiatric:        Behavior: Behavior normal.     Comments: Slightly depressed mood    ED Results / Procedures / Treatments   Labs (all  labs ordered are listed, but only abnormal results are displayed) Labs Reviewed  BASIC METABOLIC PANEL - Abnormal; Notable for the following components:      Result Value   Chloride 112 (*)    CO2 20 (*)    Glucose, Bld 235 (*)    BUN 37 (*)    Creatinine, Ser 2.07 (*)    Calcium 8.5 (*)    GFR, Estimated 34 (*)    All other components within normal limits  CBC - Abnormal; Notable for the following components:   RBC 3.35 (*)    Hemoglobin 9.2 (*)    HCT 29.3 (*)    All other components within normal limits  TROPONIN I (HIGH SENSITIVITY) - Abnormal; Notable for the following components:   Troponin I (High Sensitivity) 20 (*)    All other components within normal limits  TROPONIN I (HIGH SENSITIVITY) - Abnormal; Notable for the following components:   Troponin I (High Sensitivity) 23 (*)    All other components within normal limits    EKG EKG Interpretation  Date/Time:  Wednesday October 31 2021 19:32:56 EST Ventricular Rate:  94 PR Interval:  140 QRS Duration: 78 QT Interval:  360 QTC Calculation: 450 R Axis:   83 Text Interpretation: Normal sinus rhythm Minimal voltage criteria for LVH, may be normal variant ( Sokolow-Lyon ) Borderline ECG When compared with ECG of 14-Oct-2021 20:17, REPOLARIZATION ABNORMALITY is no longer present Confirmed by Delora Fuel ([id number]) on 11/01/2021 12:53:10 AM  Radiology DG Chest 2 View  Result Date: 10/31/2021 CLINICAL DATA:  Chest pain. EXAM: CHEST - 2 VIEW COMPARISON:  Chest x-ray 10/14/2021. FINDINGS: Cardiomediastinal silhouette is within normal limits. Patient is status post cardiac surgery. The lungs are clear. There is no pleural effusion or pneumothorax.  Degenerative changes affect the spine. No acute fractures are seen. IMPRESSION: No active cardiopulmonary disease. Electronically Signed   By: Ronney Asters M.D.   On: 10/31/2021 20:22   MR BRAIN WO CONTRAST  Result Date: 11/01/2021 CLINICAL DATA:  Right-sided weakness, history of cervical fusion EXAM: MRI HEAD WITHOUT CONTRAST TECHNIQUE: Multiplanar, multiecho pulse sequences of the brain and surrounding structures were obtained without intravenous contrast. COMPARISON:  CT head 04/11/2019, MR head 12/15/2014 FINDINGS: Brain: There is no evidence of acute intracranial hemorrhage, extra-axial fluid collection, or acute infarct. There is moderate global parenchymal volume loss with prominence of the ventricular system and extra-axial CSF spaces. Confluent FLAIR signal abnormality in the subcortical and periventricular white matter likely reflects sequela of moderate chronic white matter microangiopathy. Is no suspicious parenchymal signal abnormality. There is a single punctate chronic microhemorrhage in the right parietal lobe, nonspecific. There is a small T1 hyperintense lesion measuring approximately 4 mm in the third ventricle, unchanged since 2016 and consistent with a small lipoma. There is no other mass lesion. There is no midline shift. Vascular: Normal flow voids. Skull and upper cervical spine: There is no suspicious marrow signal. Postsurgical changes are noted in the upper cervical spine. There is mild adjacent segment disease at C2-C3 without evidence of high-grade spinal canal stenosis or cord compression. Sinuses/Orbits: There is minimal mucosal thickening in the paranasal sinuses. The globes and orbits are unremarkable. Other: None. IMPRESSION: 1. No acute intracranial pathology. 2. Moderate global parenchymal volume loss and chronic white matter microangiopathy. 3. Postsurgical changes in the upper cervical spine with mild adjacent segment disease at C2-C3 without evidence of high-grade spinal  canal stenosis or cord compression to the level imaged. Electronically Signed  By: Valetta Mole M.D.   On: 11/01/2021 09:57    Procedures Procedures    Medications Ordered in ED Medications  ondansetron (ZOFRAN-ODT) disintegrating tablet 4 mg (has no administration in time range)    ED Course/ Medical Decision Making/ A&P                           Medical Decision Making Amount and/or Complexity of Data Reviewed External Data Reviewed: notes. Labs: ordered. Decision-making details documented in ED Course. Radiology: ordered and independent interpretation performed. Decision-making details documented in ED Course. ECG/medicine tests: ordered and independent interpretation performed. Decision-making details documented in ED Course.  Risk Prescription drug management.   Patient is a 71 year old male who presents to the emergency room frequently for chest pain presenting today initially with a complaint of chest pain but more with a complaint of dizziness and feeling like he had difficulty walking.  External medical records were reviewed. On exam patient is well-appearing.  His chest pain seems to be musculoskeletal in nature and he even admits its been present since having his CABG.  I independently interpreted patient's labs and troponins are in the 20sx2 which seems to be his baseline based on prior records.  BMP with persistent CKD with a creatinine of 2.07 but no different than baseline.  CBC with normal white count, hemoglobin of 9.2 which seems to be baseline and normal platelet function.  EKG without acute changes today which I independently interpreted.  I independently evaluated chest x-ray which appears normal.  Given patient's risk factors, complaints which have been he reports present for maybe a few weeks but worsening gait yesterday we will do an MRI to rule out stroke.  Per radiology the MRI shows global volume loss but shows no evidence of acute infarct.  Feel that patient's  symptoms are most likely more of a chronic nature and related to his underlying medical conditions.  Do feel it will be important for him to follow-up as an outpatient but based on patient's findings today, review of external medical records feel that he is stable for discharge and considered admission but he does not meet criteria today.  Findings discussed with the patient and his questions were answered.  He was on a cardiac monitor throughout his stay with no evidence of dysrhythmia.  He is currently living at the shelter and they are working on finding him more permanent housing but at this time there does not appear to be any social determinants affecting his care or discharge.        Final Clinical Impression(s) / ED Diagnoses Final diagnoses:  Dizziness    Rx / DC Orders ED Discharge Orders     None         Blanchie Dessert, MD 11/01/21 1042

## 2021-11-01 NOTE — Discharge Instructions (Signed)
Unfortunately a lot of the medications you take for your heart could cause dizziness.  Today you do not have any signs of stroke.  It will be important for you to follow-up with the doctor may be there is a medication they can discontinue that might make you feel better.  But for the time being continue to take the medications you are on.  There is no sign with any heart problems today.

## 2021-11-02 ENCOUNTER — Telehealth: Payer: Self-pay

## 2021-11-02 NOTE — Telephone Encounter (Signed)
Pt has been scheduled for 11/29/21 at 9:00

## 2021-11-02 NOTE — Telephone Encounter (Signed)
-----   Message from [name], MD sent at 10/25/2021  5:11 AM EST ----- Angelina Sheriff  this pt needs appt with me in Feb. Let me know date/time ok to double book Sharyn Lull. We saw him yesterday at [facility],  he went to [facility] for a few months and received care there after you saw him one time in July,  If ok with you let me see him at [facility], will be an easier commute.  We also will get him back in with [facility] for hiv and cardiology for vascular disease

## 2021-11-03 ENCOUNTER — Emergency Department (HOSPITAL_COMMUNITY)
Admission: EM | Admit: 2021-11-03 | Discharge: 2021-11-04 | Disposition: A | Discharge status: Home / Self Care | Payer: Medicare Other | Attending: Student | Admitting: Student

## 2021-11-03 ENCOUNTER — Emergency Department (HOSPITAL_COMMUNITY): Payer: Medicare Other

## 2021-11-03 ENCOUNTER — Other Ambulatory Visit: Payer: Self-pay

## 2021-11-03 ENCOUNTER — Encounter (HOSPITAL_COMMUNITY): Payer: Self-pay | Admitting: Student

## 2021-11-03 DIAGNOSIS — Z21 Asymptomatic human immunodeficiency virus [HIV] infection status: Secondary | ICD-10-CM | POA: Diagnosis not present

## 2021-11-03 DIAGNOSIS — R778 Other specified abnormalities of plasma proteins: Secondary | ICD-10-CM | POA: Diagnosis not present

## 2021-11-03 DIAGNOSIS — Z7982 Long term (current) use of aspirin: Secondary | ICD-10-CM | POA: Insufficient documentation

## 2021-11-03 DIAGNOSIS — F1994 Other psychoactive substance use, unspecified with psychoactive substance-induced mood disorder: Secondary | ICD-10-CM | POA: Diagnosis present

## 2021-11-03 DIAGNOSIS — F333 Major depressive disorder, recurrent, severe with psychotic symptoms: Secondary | ICD-10-CM | POA: Insufficient documentation

## 2021-11-03 DIAGNOSIS — R0602 Shortness of breath: Secondary | ICD-10-CM | POA: Insufficient documentation

## 2021-11-03 DIAGNOSIS — E1165 Type 2 diabetes mellitus with hyperglycemia: Secondary | ICD-10-CM | POA: Diagnosis not present

## 2021-11-03 DIAGNOSIS — I2510 Atherosclerotic heart disease of native coronary artery without angina pectoris: Secondary | ICD-10-CM | POA: Insufficient documentation

## 2021-11-03 DIAGNOSIS — R45851 Suicidal ideations: Secondary | ICD-10-CM | POA: Insufficient documentation

## 2021-11-03 DIAGNOSIS — D649 Anemia, unspecified: Secondary | ICD-10-CM | POA: Insufficient documentation

## 2021-11-03 DIAGNOSIS — F1494 Cocaine use, unspecified with cocaine-induced mood disorder: Secondary | ICD-10-CM | POA: Diagnosis present

## 2021-11-03 DIAGNOSIS — F1420 Cocaine dependence, uncomplicated: Secondary | ICD-10-CM | POA: Diagnosis present

## 2021-11-03 DIAGNOSIS — Z20822 Contact with and (suspected) exposure to covid-19: Secondary | ICD-10-CM | POA: Insufficient documentation

## 2021-11-03 DIAGNOSIS — Z79899 Other long term (current) drug therapy: Secondary | ICD-10-CM | POA: Diagnosis not present

## 2021-11-03 DIAGNOSIS — R0600 Dyspnea, unspecified: Secondary | ICD-10-CM

## 2021-11-03 DIAGNOSIS — Z794 Long term (current) use of insulin: Secondary | ICD-10-CM | POA: Insufficient documentation

## 2021-11-03 DIAGNOSIS — F1424 Cocaine dependence with cocaine-induced mood disorder: Secondary | ICD-10-CM | POA: Diagnosis not present

## 2021-11-03 DIAGNOSIS — R079 Chest pain, unspecified: Secondary | ICD-10-CM | POA: Insufficient documentation

## 2021-11-03 MED ORDER — AMLODIPINE BESYLATE 5 MG PO TABS
10.0000 mg | ORAL_TABLET | Freq: Once | ORAL | Status: AC
  Administered 2021-11-03: 10 mg via ORAL
  Filled 2021-11-03: qty 2

## 2021-11-03 NOTE — ED Triage Notes (Signed)
Patient presents with complaints of chest pain, shortness of breath ans SI without a plan. He reported to EMS that the chest pain makes him suicidal. The patient has also had a few recent falls. He is homeless and does not take medications for diabetes, hypertension or his Xarelto.   Hx: Chronic chest pain, Suicide attempt  EMS vitals: 18 RR 100 % SPO2 on room air 247 CBG 92 HR 170/108 BP

## 2021-11-03 NOTE — ED Provider Notes (Signed)
[facility] DEPT Provider Note   CSN: [id number] Arrival date & time: 11/03/21  2233     History  Chief Complaint  Patient presents with   Chest Pain   Shortness of Breath   Suicidal    Michael [name] is a 71 y.o. male with a hx of CAD, afib, PE, DM, cocaine use, HIV, and additional comorbidities as listed below who presents to the ED with complaints of chest pain. Patient reports chest pain daily for months, occurs anteriorly, difficulty describe, associated dyspnea and a times coughs. Reports associated SI, no specific plain/attempt, and nonspecific HI and hallucinations. Has not been taking his medications consistently. Smoked cocaine 3 hours PTA. Denies vomiting, diaphoresis, syncope, unilateral leg pain/swelling, or hemoptysis.   HPI     Home Medications Prior to Admission medications   Medication Sig Start Date End Date Taking? Authorizing Provider  albuterol (VENTOLIN HFA) 108 (90 Base) MCG/ACT inhaler Inhale 1-2 puffs into the lungs every 6 (six) hours as needed for wheezing or shortness of breath. 10/24/21   [name], MD  amLODipine (NORVASC) 10 MG tablet Take 1 tablet (10 mg total) by mouth daily. 10/24/21   [name], MD  aspirin EC 81 MG tablet Take 81 mg by mouth daily. Swallow whole. Patient not taking: Reported on 10/15/2021    [provider]  bictegravir-emtricitabine-tenofovir AF (BIKTARVY) 50-200-25 MG TABS tablet Take 1 tablet by mouth daily. 10/24/21   [name], MD  budesonide-formoterol [facility]) 160-4.5 MCG/ACT inhaler Inhale 2 puffs into the lungs in the morning and at bedtime. 10/24/21   [name], MD  buPROPion (WELLBUTRIN XL) 150 MG 24 hr tablet Take 1 tablet (150 mg total) by mouth every morning. 10/24/21   [name], MD  hydrALAZINE (APRESOLINE) 25 MG tablet Take 3 tablets (75 mg total) by mouth 3 (three) times daily. 10/24/21 12/23/21  [name], MD  insulin glargine  (LANTUS SOLOSTAR) 100 UNIT/ML Solostar Pen Inject 30 Units into the skin daily. 10/24/21   [name], MD  insulin lispro (HUMALOG KWIKPEN) 100 UNIT/ML KwikPen Inject 4 Units into the skin 3 (three) times daily with meals. 10/24/21   [name], MD  Insulin Pen Needle 31G X 5 MM MISC Use with insulin pens 10/24/21   [name], MD  isosorbide mononitrate (IMDUR) 60 MG 24 hr tablet Take 2 tablets (120 mg total) by mouth daily. 10/24/21   [name], MD  lidocaine (LIDODERM) 5 % Place 1 patch onto the skin daily as needed (For chestwall pain). Remove & Discard patch within 12 hours or as directed by MD 10/24/21   [name], MD  nitroGLYCERIN (NITROSTAT) 0.4 MG SL tablet Place 1 tablet (0.4 mg total) under the tongue every 5 (five) minutes as needed for chest pain. 10/24/21   [name], MD  pantoprazole (PROTONIX) 40 MG tablet Take 1 tablet (40 mg total) by mouth daily as needed (for heartburn). 10/24/21   [name], MD  traZODone (DESYREL) 50 MG tablet Take 1 tablet (50 mg total) by mouth at bedtime as needed for sleep. 10/24/21   [name], MD      Allergies    Patient has no known allergies.    Review of Systems   Review of Systems  Constitutional:  Negative for diaphoresis and fever.  Respiratory:  Positive for shortness of breath.   Cardiovascular:  Positive for chest pain.  Gastrointestinal:  Negative for abdominal pain and vomiting.  Neurological:  Negative for syncope.  Psychiatric/Behavioral:  Positive for hallucinations and suicidal ideas.   All other systems reviewed and are negative.  Physical Exam Updated Vital Signs BP (!) 185/105 (BP Location: Left Arm)    Pulse 78    Temp 98.1 F (36.7 C) (Oral)    Resp 17    SpO2 100%  Physical Exam Vitals and nursing note reviewed.  Constitutional:      General: He is not in acute distress.    Appearance: He is well-developed. He is not toxic-appearing.  HENT:     Head: Normocephalic  and atraumatic.  Eyes:     General:        Right eye: No discharge.        Left eye: No discharge.     Conjunctiva/sclera: Conjunctivae normal.  Cardiovascular:     Rate and Rhythm: Normal rate and regular rhythm.     Pulses:          Dorsalis pedis pulses are 2+ on the right side and 2+ on the left side.  Pulmonary:     Effort: Pulmonary effort is normal. No respiratory distress.     Breath sounds: Normal breath sounds. No wheezing, rhonchi or rales.  Abdominal:     General: There is no distension.     Palpations: Abdomen is soft.     Tenderness: There is no abdominal tenderness.  Musculoskeletal:     Cervical back: Neck supple.     Comments: No significant pitting edema.  R 2nd toe amputation.  No calf tenderness.   Skin:    General: Skin is warm and dry.     Findings: No rash.  Neurological:     Mental Status: He is alert.     Comments: Clear speech.   Psychiatric:        Behavior: Behavior is cooperative.        Thought Content: Thought content includes homicidal and suicidal ideation.    ED Results / Procedures / Treatments   Labs (all labs ordered are listed, but only abnormal results are displayed) Labs Reviewed  COMPREHENSIVE METABOLIC PANEL - Abnormal; Notable for the following components:      Result Value   Glucose, Bld 268 (*)    BUN 42 (*)    Creatinine, Ser 2.08 (*)    Calcium 8.8 (*)    GFR, Estimated 33 (*)    All other components within normal limits  CBC - Abnormal; Notable for the following components:   RBC 3.51 (*)    Hemoglobin 9.7 (*)    HCT 30.2 (*)    All other components within normal limits  SALICYLATE LEVEL - Abnormal; Notable for the following components:   Salicylate Lvl <4.6 (*)    All other components within normal limits  ACETAMINOPHEN LEVEL - Abnormal; Notable for the following components:   Acetaminophen (Tylenol), Serum <10 (*)    All other components within normal limits  RAPID URINE DRUG SCREEN, HOSP PERFORMED - Abnormal;  Notable for the following components:   Cocaine POSITIVE (*)    All other components within normal limits  TROPONIN I (HIGH SENSITIVITY) - Abnormal; Notable for the following components:   Troponin I (High Sensitivity) 24 (*)    All other components within normal limits  TROPONIN I (HIGH SENSITIVITY) - Abnormal; Notable for the following components:   Troponin I (High Sensitivity) 26 (*)    All other components within normal limits  RESP PANEL BY RT-PCR (FLU A&B, COVID) ARPGX2  ETHANOL    EKG EKG Interpretation  Date/Time:  Saturday November 03 2021 22:47:05 EST Ventricular Rate:  82 PR Interval:  150 QRS Duration: 80 QT Interval:  371 QTC Calculation: 434 R Axis:   62 Text Interpretation: Sinus arrhythmia Consider left ventricular hypertrophy Confirmed by Quintella Reichert [phone]) on 11/03/2021 11:03:52 PM  Radiology DG Chest Port 1 View  Result Date: 11/03/2021 CLINICAL DATA:  Chest pain and shortness of breath. EXAM: PORTABLE CHEST 1 VIEW COMPARISON:  Portable chest 10/31/2021. FINDINGS: The cardiac size is normal. Sternotomy and CABG changes are again noted with left atrial appendage clip and LAD coronary artery stent. The lungs are clear. No pleural effusion is seen. There is overlying monitor wiring and partially visible multilevel cervical spine ACDF plating. Aortic atherosclerosis with stable mediastinum. Thoracic spondylosis. IMPRESSION: No active disease.  Stable chest. Electronically Signed   By: Telford Nab M.D.   On: 11/03/2021 23:33    Procedures Procedures    Medications Ordered in ED Medications - No data to display  ED Course/ Medical Decision Making/ A&P                           Medical Decision Making Amount and/or Complexity of Data Reviewed Labs: ordered. Radiology: ordered.  Risk OTC drugs. Prescription drug management.   Patient presents to the ED with complaints of chest pain & SI, this involves an extensive number of treatment options, and is  a complaint that carries with it a high risk of complications and morbidity. Nontoxic, vitals with hypertension- hx of same with prior ED visits. Chest pain workup initiated.    Additional history obtained:  Additional history obtained from chart review & nursing note review. - visits for similar complaints previously.   EKG: no STEMI- Sinus arrhythmia Consider left ventricular hypertrophy   Lab Tests:  I Ordered, reviewed, and interpreted labs, pertinent results include:  CBC: mild anemia similar to prior CMP: renal function similar to prior, hyperglycemic.  Acetaminophen/salicylate/ethanol levels: Negative Troponin: Mild elevation- similar to prior.  UDS: Positive for cocaine.   Imaging Studies ordered:  I ordered imaging studies which included CXR,  I independently reviewed & interpreted imaging & am in agreement with radiology impression which shows: No active disease.  Stable chest.  ED Course:  EKG without acute ischemia, troponins without significant delta elevation & fairly similar to prior- doubt ACS. No hypoxia/tachycardia to raise concern for PE.  No widened mediastinum on chest x-ray, symmetric pulses, low suspicion for dissection.  Chest x-ray unremarkable.  Labs appear to be at baseline overall. Patient medically cleared. Consult placed to TTS- evaluated patient- recommended obs overnight with reassessment by psych in the AM. Home meds re-ordered.  Findings and plan of care discussed with supervising physician Dr. Ayesha Rumpf who is evaluated the patient and shared visit and is in agreement.  Portions of this note were generated with [employer]. Dictation errors may occur despite best attempts at proofreading.         Final Clinical Impression(s) / ED Diagnoses Final diagnoses:  Suicidal ideation  Chest pain, unspecified type    Rx / DC Orders ED Discharge Orders     None         Amaryllis Dyke, PA-C 11/04/21 0443    Quintella Reichert,  MD 11/05/21 [phone]

## 2021-11-04 DIAGNOSIS — F1994 Other psychoactive substance use, unspecified with psychoactive substance-induced mood disorder: Secondary | ICD-10-CM

## 2021-11-04 LAB — RAPID URINE DRUG SCREEN, HOSP PERFORMED
Amphetamines: NOT DETECTED
Barbiturates: NOT DETECTED
Benzodiazepines: NOT DETECTED
Cocaine: POSITIVE — AB
Opiates: NOT DETECTED
Tetrahydrocannabinol: NOT DETECTED

## 2021-11-04 LAB — SALICYLATE LEVEL: Salicylate Lvl: <7 mg/dL — ABNORMAL LOW (ref 7.0–30.0)

## 2021-11-04 LAB — COMPREHENSIVE METABOLIC PANEL
ALT: 16 U/L (ref 0–44)
AST: 16 U/L (ref 15–41)
Albumin: 3.6 g/dL (ref 3.5–5.0)
Alkaline Phosphatase: 87 U/L (ref 38–126)
Anion gap: 5 (ref 5–15)
BUN: 42 mg/dL — ABNORMAL HIGH (ref 8–23)
CO2: 23 mmol/L (ref 22–32)
Calcium: 8.8 mg/dL — ABNORMAL LOW (ref 8.9–10.3)
Chloride: 109 mmol/L (ref 98–111)
Creatinine, Ser: 2.08 mg/dL — ABNORMAL HIGH (ref 0.61–1.24)
GFR, Estimated: 33 mL/min — ABNORMAL LOW (ref >60)
Glucose, Bld: 268 mg/dL — ABNORMAL HIGH (ref 70–99)
Potassium: 4.3 mmol/L (ref 3.5–5.1)
Sodium: 137 mmol/L (ref 135–145)
Total Bilirubin: 0.6 mg/dL (ref 0.3–1.2)
Total Protein: 7 g/dL (ref 6.5–8.1)

## 2021-11-04 LAB — RESP PANEL BY RT-PCR (FLU A&B, COVID) ARPGX2
Influenza A by PCR: NEGATIVE
Influenza B by PCR: NEGATIVE
SARS Coronavirus 2 by RT PCR: NEGATIVE

## 2021-11-04 LAB — CBC
HCT: 30.2 % — ABNORMAL LOW (ref 39.0–52.0)
Hemoglobin: 9.7 g/dL — ABNORMAL LOW (ref 13.0–17.0)
MCH: 27.6 pg (ref 26.0–34.0)
MCHC: 32.1 g/dL (ref 30.0–36.0)
MCV: 86 fL (ref 80.0–100.0)
Platelets: 178 10*3/uL (ref 150–400)
RBC: 3.51 MIL/uL — ABNORMAL LOW (ref 4.22–5.81)
RDW: 12.6 % (ref 11.5–15.5)
WBC: 5.2 10*3/uL (ref 4.0–10.5)
nRBC: 0 % (ref 0.0–0.2)

## 2021-11-04 LAB — CBG MONITORING, ED
Glucose-Capillary: 180 mg/dL — ABNORMAL HIGH (ref 70–99)
Glucose-Capillary: 274 mg/dL — ABNORMAL HIGH (ref 70–99)
Glucose-Capillary: 276 mg/dL — ABNORMAL HIGH (ref 70–99)
Glucose-Capillary: 204 mg/dL — ABNORMAL HIGH (ref 70–99)

## 2021-11-04 LAB — TROPONIN I (HIGH SENSITIVITY)
Troponin I (High Sensitivity): 24 ng/L — ABNORMAL HIGH (ref <18)
Troponin I (High Sensitivity): 26 ng/L — ABNORMAL HIGH (ref <18)

## 2021-11-04 LAB — ACETAMINOPHEN LEVEL: Acetaminophen (Tylenol), Serum: <10 ug/mL — ABNORMAL LOW (ref 10–30)

## 2021-11-04 LAB — ETHANOL: Alcohol, Ethyl (B): <10 mg/dL (ref <10)

## 2021-11-04 MED ORDER — TRAZODONE HCL 100 MG PO TABS: 50.0000 mg | ORAL_TABLET | Freq: Every evening | ORAL | Status: DC | PRN

## 2021-11-04 MED ORDER — LIDOCAINE 5 % EX PTCH: 1.0000 | MEDICATED_PATCH | Freq: Every day | CUTANEOUS | Status: DC | PRN

## 2021-11-04 MED ORDER — BICTEGRAVIR-EMTRICITAB-TENOFOV 50-200-25 MG PO TABS
1.0000 | ORAL_TABLET | Freq: Every day | ORAL | Status: DC
  Administered 2021-11-04: 1 via ORAL
  Filled 2021-11-04: qty 1

## 2021-11-04 MED ORDER — MOMETASONE FURO-FORMOTEROL FUM 200-5 MCG/ACT IN AERO
2.0000 | INHALATION_SPRAY | Freq: Two times a day (BID) | RESPIRATORY_TRACT | Status: DC
  Administered 2021-11-04: 2 via RESPIRATORY_TRACT
  Filled 2021-11-04: qty 8.8

## 2021-11-04 MED ORDER — PANTOPRAZOLE SODIUM 40 MG PO TBEC: 40.0000 mg | DELAYED_RELEASE_TABLET | Freq: Every day | ORAL | Status: DC | PRN

## 2021-11-04 MED ORDER — INSULIN ASPART 100 UNIT/ML IJ SOLN
4.0000 [IU] | Freq: Three times a day (TID) | INTRAMUSCULAR | Status: DC
  Administered 2021-11-04 (×2): 4 [IU] via SUBCUTANEOUS
  Filled 2021-11-04: qty 0.04

## 2021-11-04 MED ORDER — ISOSORBIDE MONONITRATE ER 60 MG PO TB24
120.0000 mg | ORAL_TABLET | Freq: Every day | ORAL | Status: DC
  Administered 2021-11-04: 120 mg via ORAL
  Filled 2021-11-04: qty 2

## 2021-11-04 MED ORDER — ALBUTEROL SULFATE HFA 108 (90 BASE) MCG/ACT IN AERS: 1.0000 | INHALATION_SPRAY | Freq: Four times a day (QID) | RESPIRATORY_TRACT | Status: DC | PRN

## 2021-11-04 MED ORDER — ACETAMINOPHEN 325 MG PO TABS: 650.0000 mg | ORAL_TABLET | ORAL | Status: DC | PRN

## 2021-11-04 MED ORDER — AMLODIPINE BESYLATE 5 MG PO TABS
10.0000 mg | ORAL_TABLET | Freq: Every day | ORAL | Status: DC
  Administered 2021-11-04: 10 mg via ORAL
  Filled 2021-11-04: qty 2

## 2021-11-04 MED ORDER — INSULIN LISPRO (1 UNIT DIAL) 100 UNIT/ML (KWIKPEN): 4.0000 [IU] | PEN_INJECTOR | Freq: Three times a day (TID) | SUBCUTANEOUS | Status: DC

## 2021-11-04 MED ORDER — ASPIRIN EC 81 MG PO TBEC
81.0000 mg | DELAYED_RELEASE_TABLET | Freq: Every day | ORAL | Status: DC
  Administered 2021-11-04: 81 mg via ORAL
  Filled 2021-11-04: qty 1

## 2021-11-04 MED ORDER — BUPROPION HCL ER (XL) 150 MG PO TB24
150.0000 mg | ORAL_TABLET | Freq: Every morning | ORAL | Status: DC
  Administered 2021-11-04: 150 mg via ORAL
  Filled 2021-11-04: qty 1

## 2021-11-04 MED ORDER — INSULIN GLARGINE 100 UNIT/ML ~~LOC~~ SOLN
30.0000 [IU] | Freq: Every day | SUBCUTANEOUS | Status: DC
  Filled 2021-11-04: qty 0.3

## 2021-11-04 MED ORDER — INSULIN GLARGINE-YFGN 100 UNIT/ML ~~LOC~~ SOLN
30.0000 [IU] | Freq: Every day | SUBCUTANEOUS | Status: DC
  Administered 2021-11-04: 30 [IU] via SUBCUTANEOUS
  Filled 2021-11-04: qty 0.3

## 2021-11-04 MED ORDER — HYDRALAZINE HCL 25 MG PO TABS
75.0000 mg | ORAL_TABLET | Freq: Three times a day (TID) | ORAL | Status: DC
  Administered 2021-11-04 (×2): 75 mg via ORAL
  Filled 2021-11-04 (×2): qty 3

## 2021-11-04 NOTE — Consult Note (Signed)
Telepsych Consultation   Reason for Consult:  psych evaluation Referring Physician:  Kennith Maes, PA-C Location of Patient: [facility] Location of Provider: [place] Department  Patient Identification: Michael [name] MRN:  [id number] Principal Diagnosis: Substance induced mood disorder ([place]) Diagnosis:  Principal Problem:   Substance induced mood disorder ([place]) Active Problems:   Cocaine use disorder, severe, dependence ([place])   Cocaine-induced mood disorder ([place])  Total Time spent with patient: 15 minutes  Subjective:   Michael [name] is a 71 y.o. male patient admitted with substance induced mood disorder.  "My health problems. I don't know. I'm stressed out all the time". States cocaine makes me "forget". Patient endorses active cocaine use; UDS+cocaine. States "it's hard to stop. It's not as easy as you think". Patient states he's been living at local shelter; receives monthly disability check ($1500/mos). He endorses chronic depression and suicidality that intensifies while intoxicated. He denies any active suicidal or homicidal thoughts, auditory or visual hallucinations at this time. He presents calm and cooperative; alert and oriented. Not active psychosis noted. Provider discussed at length substance abuse affects on mental and physical health, poor insight on the effects of his addiction on his cardiovascular health.   HPI:  Michael [name] is a 71 year old male with a past history of HIV, cocaine abuse (active), substance induced mood disorder, major depressive disorder recurrent severe without psychosis, substance or medication induced depressive disorder who presented to [facility] via EMS with complaints of chest pain, SOB, and SI without a plan where he reported chest pain makes him suicidal. He is currently homeless and per chart review is non-compliant with his outpatient medications for diabetes, hypertension, or Xarelto. UDS +cocaine, BAL<10. PDMP  reviewed.   Past Psychiatric History: cocaine abuse (active), substance induced mood disorder, major depressive disorder recurrent severe without psychosis, substance or medication induced depressive disorder  Risk to Self:  pt denies Risk to Others:  pt denies Prior Inpatient Therapy:  pt denies Prior Outpatient Therapy:  pt denies  Past Medical History:  Past Medical History:  Diagnosis Date   A-fib ([place])    Asthma    No PFTs, history of childhood asthma   CAD (coronary artery disease)    a. 06/2013 STEMI/PCI ([facility]): LAD w/ thrombus (treated with BMS), mid 75%, D2 75%; LCX OM2 75%; RCA small, PDA 95%, PLV 95%;  b. 10/2013 Cath/PCI: ISR w/in LAD (Promus DES x 2), borderline OM2 lesion;  c. 01/2014 MV: Intermediate risk, medium-sized distal ant wall infarct w/ very small amt of peri-infarct ischemia. EF 60%.   Cellulitis 04/2014   left facial   Cellulitis and abscess of toe of right foot 12/08/2019   Chondromalacia of medial femoral condyle    Left knee MRI 04/28/12: Chondromalacia of the medial femoral condyle with slight peripheral degeneration of the meniscocapsular junction of the medial meniscus; followed by sports medicine   Collagen vascular disease ([place])    Crack cocaine use    for 20+ years, has been enrolled in detox programs in the past   Depression    with history of hospitalization for suicidal ideation   Diabetes mellitus 2002   Diagnosed in 2002, started insulin in 2012   Gout    Gout 04/28/2012   Headache(784.0)    CT head 08/2011: Periventricular and subcortical white matter hypodensities are most in keeping with chronic microangiopathic change   HIV infection [facility]) Nov 2012   Followed by [name]   Hyperlipidemia  Hypertension    Pulmonary embolism [facility])     Past Surgical History:  Procedure Laterality Date   AMPUTATION Right 07/21/2019   Procedure: RIGHT SECOND TOE AMPUTATION;  Surgeon: [name], MD;  Location: [place];  Service: Orthopedics;   Laterality: Right;   BACK SURGERY     1988   BOWEL RESECTION     CARDIAC SURGERY     CERVICAL SPINE SURGERY     " rods in my neck "   CORONARY ARTERY BYPASS GRAFT     CORONARY STENT PLACEMENT     NM MYOCAR PERF WALL MOTION  12/27/2011   normal   SPINE SURGERY     Family History:  Family History  Problem Relation Age of Onset   Diabetes Mother    Hypertension Mother    Hyperlipidemia Mother    Diabetes Father    Cancer Father    Hypertension Father    Diabetes Brother    Heart disease Brother    Diabetes Sister    Colon cancer Neg Hx    Family Psychiatric History: not noted Social History:  Social History   Substance and Sexual Activity  Alcohol Use No   Alcohol/week: 4.0 standard drinks   Types: 2 Cans of beer, 2 Shots of liquor per week     Social History   Substance and Sexual Activity  Drug Use Not Currently   Frequency: 4.0 times per week   Types: "Crack" cocaine, Cocaine   Comment: Recent use of crack    Social History   Socioeconomic History   Marital status: Widowed    Spouse name: Not on file   Number of children: 2   Years of education: 1   Highest education level: 12th grade  Occupational History    Employer: UNEMPLOYED    Comment: 04/2016  Tobacco Use   Smoking status: Never   Smokeless tobacco: Never  Vaping Use   Vaping Use: Never used  Substance and Sexual Activity   Alcohol use: No    Alcohol/week: 4.0 standard drinks    Types: 2 Cans of beer, 2 Shots of liquor per week   Drug use: Not Currently    Frequency: 4.0 times per week    Types: "Crack" cocaine, Cocaine    Comment: Recent use of crack   Sexual activity: Yes    Comment: DECLINED CONDOMS  Other Topics Concern   Not on file  Social History Narrative   Currently staying with a friend in [place].  Was staying @ local motel until a few days ago - left b/c of bed bugs.   [facility].  Not followed by a psychiatrist.   Social Determinants of Health   Financial  Resource Strain: Not on file  Food Insecurity: Not on file  Transportation Needs: Not on file  Physical Activity: Not on file  Stress: Not on file  Social Connections: Not on file   Additional Social History:   Allergies:  No Known Allergies  Labs:  Results for orders placed or performed during the hospital encounter of 11/03/21 (from the past 48 hour(s))  Urine rapid drug screen (hosp performed)     Status: Abnormal   Collection Time: 11/03/21 10:58 PM  Result Value Ref Range   Opiates NONE DETECTED NONE DETECTED   Cocaine POSITIVE (A) NONE DETECTED   Benzodiazepines NONE DETECTED NONE DETECTED   Amphetamines NONE DETECTED NONE DETECTED   Tetrahydrocannabinol NONE DETECTED NONE DETECTED   Barbiturates NONE DETECTED  NONE DETECTED    Comment: (NOTE) DRUG SCREEN FOR MEDICAL PURPOSES ONLY.  IF CONFIRMATION IS NEEDED FOR ANY PURPOSE, NOTIFY LAB WITHIN 5 DAYS.  LOWEST DETECTABLE LIMITS FOR URINE DRUG SCREEN Drug Class                     Cutoff (ng/mL) Amphetamine and metabolites    1000 Barbiturate and metabolites    200 Benzodiazepine                 914 Tricyclics and metabolites     300 Opiates and metabolites        300 Cocaine and metabolites        300 THC                            50 Performed at [facility], [place] [address], [place], [place] [address]   Comprehensive metabolic panel     Status: Abnormal   Collection Time: 11/03/21 11:15 PM  Result Value Ref Range   Sodium 137 135 - 145 mmol/L   Potassium 4.3 3.5 - 5.1 mmol/L   Chloride 109 98 - 111 mmol/L   CO2 23 22 - 32 mmol/L   Glucose, Bld 268 (H) 70 - 99 mg/dL    Comment: Glucose reference range applies only to samples taken after fasting for at least 8 hours.   BUN 42 (H) 8 - 23 mg/dL   Creatinine, Ser 2.08 (H) 0.61 - 1.24 mg/dL   Calcium 8.8 (L) 8.9 - 10.3 mg/dL   Total Protein 7.0 6.5 - 8.1 g/dL   Albumin 3.6 3.5 - 5.0 g/dL   AST 16 15 - 41 U/L   ALT 16 0 - 44 U/L   Alkaline  Phosphatase 87 38 - 126 U/L   Total Bilirubin 0.6 0.3 - 1.2 mg/dL   GFR, Estimated 33 (L) >60 mL/min    Comment: (NOTE) Calculated using the CKD-EPI Creatinine Equation (2021)    Anion gap 5 5 - 15    Comment: Performed at [facility], [place] [address] [place], [place] [address]  CBC     Status: Abnormal   Collection Time: 11/03/21 11:15 PM  Result Value Ref Range   WBC 5.2 4.0 - 10.5 K/uL   RBC 3.51 (L) 4.22 - 5.81 MIL/uL   Hemoglobin 9.7 (L) 13.0 - 17.0 g/dL   HCT 30.2 (L) 39.0 - 52.0 %   MCV 86.0 80.0 - 100.0 fL   MCH 27.6 26.0 - 34.0 pg   MCHC 32.1 30.0 - 36.0 g/dL   RDW 12.6 11.5 - 15.5 %   Platelets 178 150 - 400 K/uL   nRBC 0.0 0.0 - 0.2 %    Comment: Performed at [facility], [place] [address], [place], [place] [address]  Salicylate level     Status: Abnormal   Collection Time: 11/03/21 11:15 PM  Result Value Ref [place]   Salicylate Lvl <4.6 (L) 7.0 - 30.0 mg/dL    Comment: Performed at [facility], [place] [address] [place],  [address]  Acetaminophen level     Status: Abnormal   Collection Time: 11/03/21 11:15 PM  Result Value Ref Range   Acetaminophen (Tylenol), Serum <10 (L) 10 - 30 ug/mL    Comment: (NOTE) Therapeutic concentrations vary significantly. A range of 10-30 ug/mL  may be an effective concentration for many patients. However, some  are best  treated at concentrations outside of this range. Acetaminophen concentrations >150 ug/mL at 4 hours after ingestion  and >50 ug/mL at 12 hours after ingestion are often associated with  toxic reactions.  Performed at [facility], [place] [address], [place], [place] [address]   Ethanol     Status: None   Collection Time: 11/03/21 11:15 PM  Result Value Ref Range   Alcohol, Ethyl (B) <10 <10 mg/dL    Comment: (NOTE) Lowest detectable limit for serum alcohol is 10 mg/dL.  For medical purposes only. Performed at [facility], [place] [address], [place], [place] [address]   Resp Panel by RT-PCR (Flu A&B, Covid) Nasopharyngeal Swab     Status: None   Collection Time: 11/03/21 11:15 PM   Specimen: Nasopharyngeal Swab; Nasopharyngeal(NP) swabs in vial transport medium  Result Value Ref Range   SARS Coronavirus 2 by RT PCR NEGATIVE NEGATIVE    Comment: (NOTE) SARS-CoV-2 target nucleic acids are NOT DETECTED.  The SARS-CoV-2 RNA is generally detectable in upper respiratory specimens during the acute phase of infection. The lowest concentration of SARS-CoV-2 viral copies this assay can detect is 138 copies/mL. A negative result does not preclude SARS-Cov-2 infection and should not be used as the sole basis for treatment or other patient management decisions. A negative result may occur with  improper specimen collection/handling, submission of specimen other than nasopharyngeal swab, presence of viral mutation(s) within the areas targeted by this assay, and inadequate number of viral copies(<138 copies/mL). A negative result must be combined with clinical observations, patient history, and epidemiological information. The expected result is Negative.  Fact Sheet for Patients:  EntrepreneurPulse.com.au  Fact Sheet for Healthcare Providers:  IncredibleEmployment.be  This test is no t yet approved or cleared by the [place] FDA and  has been authorized for detection and/or diagnosis of SARS-CoV-2 by FDA under an Emergency Use Authorization (EUA). This EUA will remain  in effect (meaning this test can be used) for the duration of the COVID-19 declaration under Section 564(b)(1) of the Act, 21 U.S.C.section 360bbb-3(b)(1), unless the authorization is terminated  or revoked sooner.       Influenza A by PCR NEGATIVE NEGATIVE   Influenza B by PCR NEGATIVE NEGATIVE    Comment: (NOTE) The Xpert Xpress SARS-CoV-2/FLU/RSV plus assay is intended as an aid in the  diagnosis of influenza from Nasopharyngeal swab specimens and should not be used as a sole basis for treatment. Nasal washings and aspirates are unacceptable for Xpert Xpress SARS-CoV-2/FLU/RSV testing.  Fact Sheet for Patients: EntrepreneurPulse.com.au  Fact Sheet for Healthcare Providers: IncredibleEmployment.be  This test is not yet approved or cleared by the [place] FDA and has been authorized for detection and/or diagnosis of SARS-CoV-2 by FDA under an Emergency Use Authorization (EUA). This EUA will remain in effect (meaning this test can be used) for the duration of the COVID-19 declaration under Section 564(b)(1) of the Act, 21 U.S.C. section 360bbb-3(b)(1), unless the authorization is terminated or revoked.  Performed at [facility], [place] [address], [place], [place] [address]   Troponin I (High Sensitivity)     Status: Abnormal   Collection Time: 11/03/21 11:15 PM  Result Value Ref Range   Troponin I (High Sensitivity) 24 (H) <18 ng/L    Comment: (NOTE) Elevated high sensitivity troponin I (hsTnI) values and significant  changes across serial measurements may suggest ACS but many other  chronic and acute conditions are known to elevate hsTnI results.  Refer to the "Links" section for chest pain algorithms and additional  guidance. Performed at [facility], [place] [address] [place], [place] [address]   Troponin I (High Sensitivity)     Status: Abnormal   Collection Time: 11/04/21  3:15 AM  Result Value Ref Range   Troponin I (High Sensitivity) 26 (H) <18 ng/L    Comment: (NOTE) Elevated high sensitivity troponin I (hsTnI) values and significant  changes across serial measurements may suggest ACS but many other  chronic and acute conditions are known to elevate hsTnI results.  Refer to the "Links" section for chest pain algorithms and additional  guidance. Performed at [facility], [place] [address] [place], [place] [address]   POC CBG, ED     Status: Abnormal   Collection Time: 11/04/21  8:25 AM  Result Value Ref Range   Glucose-Capillary 180 (H) 70 - 99 mg/dL    Comment: Glucose reference range applies only to samples taken after fasting for at least 8 hours.  POC CBG, ED     Status: Abnormal   Collection Time: 11/04/21 12:55 PM  Result Value Ref Range   Glucose-Capillary 274 (H) 70 - 99 mg/dL    Comment: Glucose reference range applies only to samples taken after fasting for at least 8 hours.    Medications:  Current Facility-Administered Medications  Medication Dose Route Frequency Provider Last Rate Last Admin   acetaminophen (TYLENOL) tablet 650 mg  650 mg Oral Q4H PRN Petrucelli, Samantha R, PA-C       albuterol (VENTOLIN HFA) 108 (90 Base) MCG/ACT inhaler 1-2 puff  1-2 puff Inhalation Q6H PRN Quintella Reichert, MD       amLODipine (NORVASC) tablet 10 mg  10 mg Oral Daily Quintella Reichert, MD   10 mg at 11/04/21 1045   aspirin EC tablet 81 mg  81 mg Oral Daily Quintella Reichert, MD   81 mg at 11/04/21 1045   bictegravir-emtricitabine-tenofovir AF (BIKTARVY) 50-200-25 MG per tablet 1 tablet  1 tablet Oral Daily Quintella Reichert, MD   1 tablet at 11/04/21 1044   buPROPion (WELLBUTRIN XL) 24 hr tablet 150 mg  150 mg Oral q morning Quintella Reichert, MD   150 mg at 11/04/21 1045   hydrALAZINE (APRESOLINE) tablet 75 mg  75 mg Oral TID Quintella Reichert, MD   75 mg at 11/04/21 1045   insulin aspart (novoLOG) injection 4 Units  4 Units Subcutaneous TID WC Quintella Reichert, MD   4 Units at 11/04/21 1347   insulin glargine-yfgn (SEMGLEE) injection 30 Units  30 Units Subcutaneous Daily Quintella Reichert, MD   30 Units at 11/04/21 1200   isosorbide mononitrate (IMDUR) 24 hr tablet 120 mg  120 mg Oral Daily Quintella Reichert, MD   120 mg at 11/04/21 1044   lidocaine (LIDODERM) 5 % 1 patch  1 patch Transdermal Daily PRN Quintella Reichert, MD       mometasone-formoterol  [facility]) 200-5 MCG/ACT inhaler 2 puff  2 puff Inhalation BID Quintella Reichert, MD   2 puff at 11/04/21 1047   pantoprazole (PROTONIX) EC tablet 40 mg  40 mg Oral Daily PRN Quintella Reichert, MD       traZODone (DESYREL) tablet 50 mg  50 mg Oral QHS PRN Quintella Reichert, MD       Current Outpatient Medications  Medication Sig Dispense Refill   albuterol (VENTOLIN HFA) 108 (90 Base) MCG/ACT inhaler Inhale 1-2 puffs into the lungs every 6 (six) hours as needed  for wheezing or shortness of breath. 8.5 g 1   amLODipine (NORVASC) 10 MG tablet Take 1 tablet (10 mg total) by mouth daily. 60 tablet 2   aspirin EC 81 MG tablet Take 81 mg by mouth daily. Swallow whole.     bictegravir-emtricitabine-tenofovir AF (BIKTARVY) 50-200-25 MG TABS tablet Take 1 tablet by mouth daily. 30 tablet 11   budesonide-formoterol (SYMBICORT) 160-4.5 MCG/ACT inhaler Inhale 2 puffs into the lungs in the morning and at bedtime. 10.2 g 2   buPROPion (WELLBUTRIN XL) 150 MG 24 hr tablet Take 1 tablet (150 mg total) by mouth every morning. 30 tablet 3   hydrALAZINE (APRESOLINE) 25 MG tablet Take 3 tablets (75 mg total) by mouth 3 (three) times daily. 270 tablet 1   insulin glargine (LANTUS SOLOSTAR) 100 UNIT/ML Solostar Pen Inject 30 Units into the skin daily. 6 mL 3   insulin lispro (HUMALOG KWIKPEN) 100 UNIT/ML KwikPen Inject 4 Units into the skin 3 (three) times daily with meals. 15 mL 0   Insulin Pen Needle 31G X 5 MM MISC Use with insulin pens (Patient taking differently: 1 each by Other route See admin instructions. Use with insulin pens) 100 each 1   isosorbide mononitrate (IMDUR) 60 MG 24 hr tablet Take 2 tablets (120 mg total) by mouth daily. 60 tablet 2   lidocaine (LIDODERM) 5 % Place 1 patch onto the skin daily as needed (For chestwall pain). Remove & Discard patch within 12 hours or as directed by MD 30 patch 0   nitroGLYCERIN (NITROSTAT) 0.4 MG SL tablet Place 1 tablet (0.4 mg total) under the tongue every 5 (five) minutes  as needed for chest pain. 25 tablet 0   traZODone (DESYREL) 50 MG tablet Take 1 tablet (50 mg total) by mouth at bedtime as needed for sleep. 30 tablet 3   pantoprazole (PROTONIX) 40 MG tablet Take 1 tablet (40 mg total) by mouth daily as needed (for heartburn). (Patient not taking: Reported on 11/04/2021) 30 tablet 2   Musculoskeletal: Strength & Muscle Tone: within normal limits Gait & Station: normal Patient leans: N/A  Psychiatric Specialty Exam:  Presentation  General Appearance: Casual  Eye Contact:Fair  Speech:Clear and Coherent  Speech Volume:Normal  Handedness:Right   Mood and Affect  Mood:Euthymic  Affect:Congruent; Appropriate   Thought Process  Thought Processes:Coherent; Linear; Goal Directed  Descriptions of Associations:Intact  Orientation:Full (Time, Place and Person)  Thought Content:Logical  History of Schizophrenia/Schizoaffective disorder:No  Duration of Psychotic Symptoms:Less than six months  Hallucinations:Hallucinations: None  Ideas of Reference:None  Suicidal Thoughts:Suicidal Thoughts: No  Homicidal Thoughts:Homicidal Thoughts: No   Sensorium  Memory:Immediate Fair; Recent Fair; Remote Fair  Judgment:Fair  Insight:Fair; Shallow   Executive Functions  Concentration:Fair  Attention Span:Fair  [place]   Psychomotor Activity  Psychomotor Activity:Psychomotor Activity: Normal  Assets  Assets:Communication Skills; Resilience   Sleep  Sleep:Sleep: Fair   Physical Exam: Physical Exam Vitals and nursing note reviewed.  Constitutional:      Appearance: He is well-developed. He is not ill-appearing or toxic-appearing.  HENT:     Head: Normocephalic.  Cardiovascular:     Rate and Rhythm: Normal rate.  Pulmonary:     Effort: Pulmonary effort is normal.  Musculoskeletal:        General: Normal range of motion.     Cervical back: Normal range of motion.  Skin:     General: Skin is dry.  Neurological:     General: No focal  deficit present.     Mental Status: He is alert and oriented to person, place, and time.  Psychiatric:        Attention and Perception: Attention and perception normal.        Mood and Affect: Mood and affect normal.        Speech: Speech normal.        Behavior: Behavior normal. Behavior is cooperative.        Thought Content: Thought content normal. Thought content is not paranoid or delusional. Thought content does not include homicidal or suicidal ideation. Thought content does not include homicidal or suicidal plan.        Cognition and Memory: Cognition and memory normal.        Judgment: Judgment normal.   Review of Systems  Psychiatric/Behavioral:  Positive for depression and substance abuse. Negative for hallucinations, memory loss and suicidal ideas. The patient is not nervous/anxious and does not have insomnia.   All other systems reviewed and are negative. Blood pressure 118/75, pulse 84, temperature 98.1 F (36.7 C), temperature source Oral, resp. rate 15, SpO2 99 %. There is no height or weight on file to calculate BMI.  Treatment Plan Summary: Plan Discharge patient with outpatient resources for [facility], [facility], and outpatient substance abuse resources.   Disposition: No evidence of imminent risk to self or others at present.   Patient does not meet criteria for psychiatric inpatient admission. Supportive therapy provided about ongoing stressors. Discussed crisis plan, support from social network, calling 911, coming to the Emergency Department, and calling Suicide Hotline.  This service was provided via telemedicine using a 2-way, interactive audio and video technology.  Names of all persons participating in this telemedicine service and their role in this encounter. Name: Oneida Alar Role: PMHNP  Name: Hampton Abbot Role: Attending MD  Name: [name] Role: patient  Name:  Role:      Inda Merlin, NP 11/04/2021 3:55 PM

## 2021-11-04 NOTE — Progress Notes (Signed)
Substance abuse resources attached to patients discharge instructions. Patient receives $1500 monthly but spends it on drugs.

## 2021-11-04 NOTE — ED Provider Notes (Signed)
°  Physical Exam  BP 109/72    Pulse 96    Temp 98.1 F (36.7 C) (Oral)    Resp 16    SpO2 100%   Physical Exam  Procedures  Procedures  ED Course / MDM    Medical Decision Making Patient seen by psychiatry and cleared.  Patient had some chest pain and was cleared by previous provider.  Stable for discharge.  Amount and/or Complexity of Data Reviewed Labs: ordered. Radiology: ordered.  Risk OTC drugs. Prescription drug management.         Drenda Freeze, MD 11/04/21 [phone]

## 2021-11-04 NOTE — Discharge Instructions (Addendum)
Please take your meds as prescribed.  See information regarding behavioral health  I have referred you to cardiologist regarding chest pain  See your doctor for follow-up  Return to ER if you have worse chest pain, thoughts of harming yourself, hallucinations   [facility] [address] [place], [place] [address] [phone]

## 2021-11-04 NOTE — BH Assessment (Signed)
Comprehensive Clinical Assessment (CCA) Note  11/04/2021 Michael [name] [id number]  Discharge Disposition: [name], NP, reviewed pt's chart and information and determined pt should receive continuous observation overnight at [facility] and be re-assessed tomorrow by psych. This information was relayed to pt's team at 0254.  The patient demonstrates the following risk factors for suicide: Chronic risk factors for suicide include: psychiatric disorder of Major Depressive Disorder), recurrent episode, severe with psychotic features, substance use disorder, and previous suicide attempts between 4 months - over a year ago . Acute risk factors for suicide include: family or marital conflict, unemployment, and social withdrawal/isolation. Protective factors for this patient include: hope for the future. Considering these factors, the overall suicide risk at this point appears to be high. Patient is not appropriate for outpatient follow up.  Therefore, a 1:1 sitter is recommended for suicide precautions.  [place] ED from 11/03/2021 in [place] [facility]PT ED from 10/31/2021 in [place] ED from 10/14/2021 in [place] CATEGORY High Risk Error: Question 1 not populated High Risk     Chief Complaint:  Chief Complaint  Patient presents with   Chest Pain   Shortness of Breath   Suicidal   Visit Diagnosis: Major Depressive Disorder), recurrent episode, severe with psychotic features; Cocaine use Disorder, severe   CCA Screening, Triage and Referral (STR) Estevon [name] is a 71 year old patient who came to the [facility] due to ongoing chest pain and SI. Pt states, "I was having some chest pains and suicidal for a couple months now. Stress makes it worse." Pt acknowledges he's attempted to kill himself in the past, most recently 4 months ago by cutting his wrist (though, of note, he stated  that the last time he attempted was over a year ago when triaged). When asked if he has a plan to kill himself, pt states, "I never make a plan."   Pt denies HI, NSSIB, access to guns/weapons, or engagement with the legal system. Pt endorses experiencing AVH off-and-on for the past 4 months. He states he last used crack cocaine yesterday and that he spent somewhere around $200 - $300. Pt's UDA was positive for cocaine but no other substances.  Pt is oriented x5. His recent/remote memory is intact. Pt was cooperative throughout the assessment process. Pt's insight, judgement, and impulse control is poor at this time.  Patient Reported Information How did you hear about [place]? Self  What Is the Reason for Your Visit/Call Today? Pt states, "I was having some chest pains and suicidal for a couple months now. Stress makes it worse." Pt acknowledges he's attempted to kill himself in the past, most recently 4 months ago by cutting his wrist. When asked if he has a plan to kill himself, pt states, "I never make a plan." Pt denies HI, NSSIB, access to guns/weapons, or engagement with the legal system. Pt endorses experiencing AVH off-and-on for the past 4 months. He states he last used crack cocaine yesterday and that he spent somewhere around $200 - $300.  How Long Has This Been Causing You Problems? > than 6 months  What Do You Feel Would Help You the Most Today? Alcohol or Drug Use Treatment; Treatment for Depression or other mood problem; Medication(s); Housing Assistance; Stress Management   Have You Recently Had Any Thoughts About Hurting Yourself? Yes  Are You Planning to Commit Suicide/Harm Yourself At This time? No   Have you Recently Had  Thoughts About Hurting Someone [name]? No  Are You Planning to Harm Someone at This Time? No  Explanation: No data recorded  Have You Used Any Alcohol or Drugs in the Past 24 Hours? Yes  How Long Ago Did You Use Drugs or Alcohol? No data recorded What Did  You Use and How Much? Pt reports smoking $200 - $300 in crack cocaine.   Do You Currently Have a Therapist/Psychiatrist? No  Name of Therapist/Psychiatrist: No data recorded  Have You Been Recently Discharged From Any Office Practice or Programs? No  Explanation of Discharge From Practice/Program: No data recorded    CCA Screening Triage Referral Assessment Type of Contact: Tele-Assessment  Telemedicine Service Delivery: Telemedicine service delivery: This service was provided via telemedicine using a 2-way, interactive audio and video technology  Is this Initial or Reassessment? Initial Assessment  Date Telepsych consult ordered in [facility]:  11/04/21  Time Telepsych consult ordered in [facility]:  0133  Location of Assessment: [facility]  Provider Location: [facility] Assessment Services   Collateral Involvement: Pt declined for clinician to contact family and/or friends to obtain additional information. Pt denies having supports.   Does Patient Have a Stage manager Guardian? No data recorded Name and Contact of Legal Guardian: No data recorded If Minor and Not Living with Parent(s), Who has Custody? N/A  Is CPS involved or ever been involved? Never  Is APS involved or ever been involved? Never   Patient Determined To Be At Risk for Harm To Self or Others Based on Review of Patient Reported Information or Presenting Complaint? Yes, for Self-Harm  Method: No data recorded Availability of Means: No data recorded Intent: No data recorded Notification Required: No data recorded Additional Information for Danger to Others Potential: No data recorded Additional Comments for Danger to Others Potential: No data recorded Are There Guns or Other Weapons in Your Home? No data recorded Types of Guns/Weapons: No data recorded Are These Weapons Safely Secured?                            No data recorded Who Could Verify You Are Able To Have These Secured: No data recorded Do You Have any  Outstanding Charges, Pending Court Dates, Parole/Probation? No data recorded Contacted To Inform of Risk of Harm To Self or Others: Law Enforcement (LEO is aware)    Does Patient Present under Involuntary Commitment? No  IVC Papers Initial File Date: No data recorded  [place] of Residence: [place]   Patient Currently Receiving the Following Services: Not Receiving Services   Determination of Need: Urgent (48 hours)   Options For Referral: Medication Management; Outpatient Therapy; Other: Comment (Continuous Assessment at [facility])     CCA Biopsychosocial Patient Reported Schizophrenia/Schizoaffective Diagnosis in Past: No   Strengths: Pt is able to identify that he needs assistance with his physical/mental health. Pt answers the questions posed. He is able to identify his thoughts, feelings, and concerns.   Mental Health Symptoms Depression:   Fatigue; Hopelessness; Sleep (too much or little); Worthlessness; Tearfulness; Difficulty Concentrating; Irritability; Change in energy/activity (Guilt, isolation.)   Duration of Depressive symptoms:  Duration of Depressive Symptoms: Greater than two weeks   Mania:   None   Anxiety:    Sleep; Worrying; Tension; Difficulty concentrating   Psychosis:   Hallucinations   Duration of Psychotic symptoms:  Duration of Psychotic Symptoms: Less than six months   Trauma:   None   Obsessions:  None   Compulsions:   None   Inattention:   Forgetful   Hyperactivity/Impulsivity:   None   Oppositional/Defiant Behaviors:   None   Emotional Irregularity:   Recurrent suicidal behaviors/gestures/threats; Mood lability   Other Mood/Personality Symptoms:   None noted    Mental Status Exam Appearance and self-care  Stature:   Average   Weight:   Average weight   Clothing:   -- (Pt is dressed in hospital scrubs)   Grooming:   Normal   Cosmetic use:   None   Posture/gait:   Other (Comment) (Pt is lying down in his  hospital bed)   Motor activity:   Not Remarkable   Sensorium  Attention:   Normal   Concentration:   Normal   Orientation:   X5   Recall/memory:   Normal   Affect and Mood  Affect:   Depressed   Mood:   Depressed   Relating  Eye contact:   Normal   Facial expression:   Depressed   Attitude toward examiner:   Cooperative   Thought and Language  Speech flow:  Soft; Clear and Coherent   Thought content:   Appropriate to Mood and Circumstances   Preoccupation:   None   Hallucinations:   Auditory; Visual   Organization:  No data recorded  [employer] of Knowledge:   Average   Intelligence:   Average   Abstraction:   Normal   Judgement:   Poor   Reality Testing:   Adequate   Insight:   Poor   Decision Making:   Only simple   Social Functioning  Social Maturity:   Isolates   Social Judgement:   "Street Smart"   Stress  Stressors:   Housing; Illness   Coping Ability:   Overwhelmed; Deficient supports   Skill Deficits:   Decision making   Supports:   Support needed     Religion: Religion/Spirituality Are You A Religious Person?: No What is Your Religious Affiliation?: Methodist How Might This Affect Treatment?: Not assessed  Leisure/Recreation: Leisure / Recreation Do You Have Hobbies?: No  Exercise/Diet: Exercise/Diet Do You Exercise?: No Have You Gained or Lost A Significant Amount of Weight in the Past Six Months?: No Do You Follow a Special Diet?: No Do You Have Any Trouble Sleeping?: Yes Explanation of Sleeping Difficulties: Pt reports getting 2 hours of sleep per night. Pt reports he's homeless and sleeps outside on the streets.   CCA Employment/Education Employment/Work Situation: Employment / Work Situation Employment Situation: On disability Why is Patient on Disability: Pt reports he got injured on the job. Pt has medical illnesses. How Long has Patient Been on Disability: Pt reports he  was injured on July 07, 1984. Patient's Job has Been Impacted by Current Illness: No Has Patient ever Been in the [employer]?: No  Education: Education Is Patient Currently Attending School?: No Last Grade Completed: 12 Did You Attend College?: No Did You Have An Individualized Education Program (IIEP): No Did You Have Any Difficulty At School?: No Patient's Education Has Been Impacted by Current Illness: No   CCA Family/Childhood History Family and Relationship History: Family history Marital status: Widowed Widowed, when?: Pt reports his wife died two years ago. Does patient have children?: Yes How many children?: 2 How is patient's relationship with their children?: Pt reports he does not want to be a burden to his children. Pt reports he's not sure if his kids know about his housing and health issues.  Childhood  History:  Childhood History By whom was/is the patient raised?: Both parents Did patient suffer any verbal/emotional/physical/sexual abuse as a child?: No Did patient suffer from severe childhood neglect?: No Has patient ever been sexually abused/assaulted/raped as an adolescent or adult?: No Was the patient ever a victim of a crime or a disaster?: No Witnessed domestic violence?: No Has patient been affected by domestic violence as an adult?: No  Child/Adolescent Assessment:     CCA Substance Use Alcohol/Drug Use: Alcohol / Drug Use Pain Medications: See MAR Prescriptions: See MAR Over the Counter: See MAR History of alcohol / drug use?: Yes Longest period of sobriety (when/how long): Per chart: two months. Negative Consequences of Use: Personal relationships, Financial Withdrawal Symptoms: None Substance #1 Name of Substance 1: Crack-cocaine 1 - Age of First Use: 25's 1 - Amount (size/oz): $200 - $300 worth yesterday 1 - Frequency: Ongoing 1 - Duration: UTA 1 - Last Use / Amount: Yesterday 1 - Method of Aquiring: Purchase 1- Route of Use: Smoke                        ASAM's:  Six Dimensions of Multidimensional Assessment  Dimension 1:  Acute Intoxication and/or Withdrawal Potential:   Dimension 1:  Description of individual's past and current experiences of substance use and withdrawal: Ongoing use  Dimension 2:  Biomedical Conditions and Complications:   Dimension 2:  Description of patient's biomedical conditions and  complications: Recent chest pain  Dimension 3:  Emotional, Behavioral, or Cognitive Conditions and Complications:  Dimension 3:  Description of emotional, behavioral, or cognitive conditions and complications: Depression  Dimension 4:  Readiness to Change:  Dimension 4:  Description of Readiness to Change criteria: Pt does not express a desire to quit using substances  Dimension 5:  Relapse, Continued use, or Continued Problem Potential:  Dimension 5:  Relapse, continued use, or continued problem potential critiera description: Pt does not express a desire to quit using substances  Dimension 6:  Recovery/Living Environment:  Dimension 6:  Recovery/Iiving environment criteria description: Pt is homeless  ASAM Severity Score: ASAM's Severity Rating Score: 14  ASAM Recommended Level of Treatment: ASAM Recommended Level of Treatment: Level II Intensive Outpatient Treatment   Substance use Disorder (SUD) Substance Use Disorder (SUD)  Checklist Symptoms of Substance Use: Continued use despite having a persistent/recurrent physical/psychological problem caused/exacerbated by use, Continued use despite persistent or recurrent social, interpersonal problems, caused or exacerbated by use, Evidence of tolerance, Presence of craving or strong urge to use, Substance(s) often taken in larger amounts or over longer times than was intended  Recommendations for Services/Supports/Treatments: Recommendations for Services/Supports/Treatments Recommendations For Services/Supports/Treatments: Other (Comment), Medication Management,  Individual Therapy (Pt to receive continuous assessment at [facility] and be re-assessed by psychiatry in the morning)  Discharge Disposition: [name], NP, reviewed pt's chart and information and determined pt should receive continuous observation overnight at [facility] and be re-assessed tomorrow by psych. This information was relayed to pt's team at 0254.  DSM5 Diagnoses: Patient Active Problem List   Diagnosis Date Noted   Iron deficiency anemia 04/04/2021   Atypical chest pain 04/03/2021   Homelessness 12/13/2019   Cellulitis 12/07/2019   Osteomyelitis ([place]) 07/17/2019   Abscess or cellulitis of toe, right    Toe pain, right-second    MDD (major depressive disorder), recurrent episode, severe ([place]) 06/15/2019   Respiratory distress 04/12/2019   Pneumonia due to severe acute respiratory syndrome coronavirus 2 (SARS-CoV-2) 04/11/2019  COVID-19 virus infection 03/19/2019   Chest pain 10/03/2018   Malnutrition of moderate degree 09/21/2018   MDD (major depressive disorder), recurrent episode, moderate ([name])    Type II diabetes mellitus with renal manifestations ([place]) 05/04/2018   GERD (gastroesophageal reflux disease) 05/04/2018   S/P CABG (coronary artery bypass graft)    Left testicular pain    Depression 02/20/2018   Epididymo-orchitis, acute 02/20/2018   Essential hypertension 02/20/2018   Precordial chest pain 02/20/2018   AKI (acute kidney injury) ([place]) 02/14/2018   HCAP (healthcare-associated pneumonia) 02/14/2018   History of pulmonary embolism 07/04/2017   Acute hyponatremia 05/11/2017   Hyperglycemia due to type 2 diabetes mellitus ([place]) 05/11/2017   CHF (congestive heart failure) ([place]) 04/01/2017   Hepatitis C 12/30/2016   Substance induced mood disorder ([place]) 11/23/2016   Chronic ischemic heart disease 11/12/2016   Paroxysmal A-fib ([place]) 11/12/2016   Back pain 04/18/2016   S/P carotid endarterectomy 11/15/2015   MDD (major depressive disorder), recurrent severe,  without psychosis ([place]) 09/09/2015   Cocaine-induced mood disorder ([place]) 08/14/2015   Cocaine abuse with cocaine-induced mood disorder ([place]) 08/14/2015   Gout 07/10/2015   Acute renal failure superimposed on stage 3 chronic kidney disease ([place]) 03/06/2015   Anemia 03/06/2015   Chronic kidney disease, stage III (moderate) ([place]) 03/06/2015   Hypoglycemia    Encounter for general adult medical examination with abnormal findings 02/09/2015   Cocaine use disorder, severe, dependence ([place]) 12/13/2014   Substance or medication-induced depressive disorder with onset during withdrawal ([place]) 12/13/2014   Severe recurrent major depressive disorder with psychotic features ([place]) 12/12/2014   Major depressive disorder, recurrent, severe without psychotic features ([place])    Cervicalgia 06/28/2014   Lumbar radiculopathy, chronic 06/28/2014   Asthma, chronic 02/03/2014   3-vessel CAD 06/24/2013   ED (erectile dysfunction) of organic origin 07/07/2012   Hypertension goal BP (blood pressure) < 140/80 04/29/2012   Chondromalacia of left knee 03/19/2012   Hyperlipidemia with target LDL less than 100 02/12/2012   Fibromyalgia 02/12/2012   Cocaine use disorder ([place]) 01/10/2012    Class: Acute   HIV (human immunodeficiency virus infection) ([place]) 08/27/2011   Uncontrolled type 2 diabetes with neuropathy 10/17/2000     Referrals to Alternative Service(s): Referred to Alternative Service(s):   Place:   Date:   Time:    Referred to Alternative Service(s):   Place:   Date:   Time:    Referred to Alternative Service(s):   Place:   Date:   Time:    Referred to Alternative Service(s):   Place:   Date:   Time:     Dannielle Burn, LMFT

## 2021-11-09 ENCOUNTER — Other Ambulatory Visit (HOSPITAL_COMMUNITY): Payer: Self-pay

## 2021-11-11 ENCOUNTER — Emergency Department (HOSPITAL_COMMUNITY): Payer: Medicare Other

## 2021-11-11 ENCOUNTER — Emergency Department (HOSPITAL_COMMUNITY)
Admission: EM | Admit: 2021-11-11 | Discharge: 2021-11-12 | Disposition: A | Discharge status: Home / Self Care | Payer: Medicare Other | Attending: Emergency Medicine | Admitting: Emergency Medicine

## 2021-11-11 DIAGNOSIS — F1420 Cocaine dependence, uncomplicated: Secondary | ICD-10-CM | POA: Diagnosis not present

## 2021-11-11 DIAGNOSIS — I509 Heart failure, unspecified: Secondary | ICD-10-CM | POA: Diagnosis not present

## 2021-11-11 DIAGNOSIS — J45909 Unspecified asthma, uncomplicated: Secondary | ICD-10-CM | POA: Diagnosis not present

## 2021-11-11 DIAGNOSIS — R45851 Suicidal ideations: Secondary | ICD-10-CM | POA: Diagnosis not present

## 2021-11-11 DIAGNOSIS — R0789 Other chest pain: Secondary | ICD-10-CM | POA: Insufficient documentation

## 2021-11-11 DIAGNOSIS — I2510 Atherosclerotic heart disease of native coronary artery without angina pectoris: Secondary | ICD-10-CM | POA: Insufficient documentation

## 2021-11-11 DIAGNOSIS — E1122 Type 2 diabetes mellitus with diabetic chronic kidney disease: Secondary | ICD-10-CM | POA: Diagnosis not present

## 2021-11-11 DIAGNOSIS — N184 Chronic kidney disease, stage 4 (severe): Secondary | ICD-10-CM | POA: Insufficient documentation

## 2021-11-11 DIAGNOSIS — E1165 Type 2 diabetes mellitus with hyperglycemia: Secondary | ICD-10-CM | POA: Insufficient documentation

## 2021-11-11 DIAGNOSIS — Z7982 Long term (current) use of aspirin: Secondary | ICD-10-CM | POA: Insufficient documentation

## 2021-11-11 DIAGNOSIS — F1414 Cocaine abuse with cocaine-induced mood disorder: Secondary | ICD-10-CM | POA: Diagnosis present

## 2021-11-11 DIAGNOSIS — F1424 Cocaine dependence with cocaine-induced mood disorder: Secondary | ICD-10-CM | POA: Diagnosis not present

## 2021-11-11 DIAGNOSIS — Z79899 Other long term (current) drug therapy: Secondary | ICD-10-CM | POA: Insufficient documentation

## 2021-11-11 DIAGNOSIS — R109 Unspecified abdominal pain: Secondary | ICD-10-CM | POA: Insufficient documentation

## 2021-11-11 DIAGNOSIS — Z9114 Patient's other noncompliance with medication regimen: Secondary | ICD-10-CM | POA: Insufficient documentation

## 2021-11-11 DIAGNOSIS — E1140 Type 2 diabetes mellitus with diabetic neuropathy, unspecified: Secondary | ICD-10-CM | POA: Insufficient documentation

## 2021-11-11 DIAGNOSIS — Z21 Asymptomatic human immunodeficiency virus [HIV] infection status: Secondary | ICD-10-CM | POA: Diagnosis not present

## 2021-11-11 DIAGNOSIS — F1410 Cocaine abuse, uncomplicated: Secondary | ICD-10-CM | POA: Diagnosis present

## 2021-11-11 DIAGNOSIS — Z5900 Homelessness unspecified: Secondary | ICD-10-CM | POA: Insufficient documentation

## 2021-11-11 DIAGNOSIS — Z20822 Contact with and (suspected) exposure to covid-19: Secondary | ICD-10-CM | POA: Diagnosis not present

## 2021-11-11 DIAGNOSIS — Z794 Long term (current) use of insulin: Secondary | ICD-10-CM | POA: Insufficient documentation

## 2021-11-11 DIAGNOSIS — Z7951 Long term (current) use of inhaled steroids: Secondary | ICD-10-CM | POA: Insufficient documentation

## 2021-11-11 DIAGNOSIS — I130 Hypertensive heart and chronic kidney disease with heart failure and stage 1 through stage 4 chronic kidney disease, or unspecified chronic kidney disease: Secondary | ICD-10-CM | POA: Diagnosis not present

## 2021-11-11 LAB — COMPREHENSIVE METABOLIC PANEL
ALT: 14 U/L (ref 0–44)
AST: 19 U/L (ref 15–41)
Albumin: 3.3 g/dL — ABNORMAL LOW (ref 3.5–5.0)
Alkaline Phosphatase: 86 U/L (ref 38–126)
Anion gap: 8 (ref 5–15)
BUN: 45 mg/dL — ABNORMAL HIGH (ref 8–23)
CO2: 22 mmol/L (ref 22–32)
Calcium: 8.5 mg/dL — ABNORMAL LOW (ref 8.9–10.3)
Chloride: 107 mmol/L (ref 98–111)
Creatinine, Ser: 2.87 mg/dL — ABNORMAL HIGH (ref 0.61–1.24)
GFR, Estimated: 23 mL/min — ABNORMAL LOW (ref >60)
Glucose, Bld: 415 mg/dL — ABNORMAL HIGH (ref 70–99)
Potassium: 4.1 mmol/L (ref 3.5–5.1)
Sodium: 137 mmol/L (ref 135–145)
Total Bilirubin: 0.6 mg/dL (ref 0.3–1.2)
Total Protein: 6.7 g/dL (ref 6.5–8.1)

## 2021-11-11 LAB — RESP PANEL BY RT-PCR (FLU A&B, COVID) ARPGX2
Influenza A by PCR: NEGATIVE
Influenza B by PCR: NEGATIVE
SARS Coronavirus 2 by RT PCR: NEGATIVE

## 2021-11-11 LAB — CBC
HCT: 33.3 % — ABNORMAL LOW (ref 39.0–52.0)
Hemoglobin: 10.8 g/dL — ABNORMAL LOW (ref 13.0–17.0)
MCH: 27.2 pg (ref 26.0–34.0)
MCHC: 32.4 g/dL (ref 30.0–36.0)
MCV: 83.9 fL (ref 80.0–100.0)
Platelets: 176 10*3/uL (ref 150–400)
RBC: 3.97 MIL/uL — ABNORMAL LOW (ref 4.22–5.81)
RDW: 12.6 % (ref 11.5–15.5)
WBC: 5.2 10*3/uL (ref 4.0–10.5)
nRBC: 0 % (ref 0.0–0.2)

## 2021-11-11 LAB — LIPASE, BLOOD: Lipase: 120 U/L — ABNORMAL HIGH (ref 11–51)

## 2021-11-11 LAB — TROPONIN I (HIGH SENSITIVITY): Troponin I (High Sensitivity): 23 ng/L — ABNORMAL HIGH (ref <18)

## 2021-11-11 MED ORDER — MOMETASONE FURO-FORMOTEROL FUM 200-5 MCG/ACT IN AERO
2.0000 | INHALATION_SPRAY | Freq: Two times a day (BID) | RESPIRATORY_TRACT | Status: DC
  Administered 2021-11-12 (×2): 2 via RESPIRATORY_TRACT
  Filled 2021-11-11: qty 8.8

## 2021-11-11 MED ORDER — PANTOPRAZOLE SODIUM 40 MG PO TBEC
40.0000 mg | DELAYED_RELEASE_TABLET | Freq: Every day | ORAL | Status: DC | PRN
  Administered 2021-11-12: 40 mg via ORAL
  Filled 2021-11-11: qty 1

## 2021-11-11 MED ORDER — AMLODIPINE BESYLATE 5 MG PO TABS
10.0000 mg | ORAL_TABLET | Freq: Every day | ORAL | Status: DC
  Administered 2021-11-12: 10 mg via ORAL
  Filled 2021-11-11: qty 2

## 2021-11-11 MED ORDER — ALBUTEROL SULFATE HFA 108 (90 BASE) MCG/ACT IN AERS: 1.0000 | INHALATION_SPRAY | Freq: Four times a day (QID) | RESPIRATORY_TRACT | Status: DC | PRN

## 2021-11-11 MED ORDER — ISOSORBIDE MONONITRATE ER 30 MG PO TB24
120.0000 mg | ORAL_TABLET | Freq: Every day | ORAL | Status: DC
  Administered 2021-11-12: 120 mg via ORAL
  Filled 2021-11-11: qty 4

## 2021-11-11 MED ORDER — INSULIN ASPART 100 UNIT/ML IJ SOLN
4.0000 [IU] | Freq: Three times a day (TID) | INTRAMUSCULAR | Status: DC
  Administered 2021-11-12 (×2): 4 [IU] via SUBCUTANEOUS

## 2021-11-11 MED ORDER — ASPIRIN EC 81 MG PO TBEC
81.0000 mg | DELAYED_RELEASE_TABLET | Freq: Every day | ORAL | Status: DC
  Administered 2021-11-12: 81 mg via ORAL
  Filled 2021-11-11: qty 1

## 2021-11-11 MED ORDER — BUPROPION HCL ER (XL) 150 MG PO TB24
150.0000 mg | ORAL_TABLET | Freq: Every morning | ORAL | Status: DC
  Administered 2021-11-12: 150 mg via ORAL
  Filled 2021-11-11: qty 1

## 2021-11-11 MED ORDER — INSULIN GLARGINE-YFGN 100 UNIT/ML ~~LOC~~ SOLN
30.0000 [IU] | Freq: Every day | SUBCUTANEOUS | Status: DC
  Administered 2021-11-12: 30 [IU] via SUBCUTANEOUS
  Filled 2021-11-11: qty 0.3

## 2021-11-11 MED ORDER — TRAZODONE HCL 50 MG PO TABS: 50.0000 mg | ORAL_TABLET | Freq: Every evening | ORAL | Status: DC | PRN

## 2021-11-11 MED ORDER — BICTEGRAVIR-EMTRICITAB-TENOFOV 50-200-25 MG PO TABS
1.0000 | ORAL_TABLET | Freq: Every day | ORAL | Status: DC
  Administered 2021-11-12: 1 via ORAL
  Filled 2021-11-11: qty 1

## 2021-11-11 MED ORDER — HYDRALAZINE HCL 25 MG PO TABS
75.0000 mg | ORAL_TABLET | Freq: Three times a day (TID) | ORAL | Status: DC
  Administered 2021-11-12 (×2): 75 mg via ORAL
  Filled 2021-11-11 (×2): qty 3

## 2021-11-11 NOTE — ED Triage Notes (Addendum)
BIBEMS from [facility], patient reports CP over last year that has been getting worse everyday and SI HI. Patient calm and cooperative at this time.  Hx of cardiac surgery. Patient reports hx of SI attempt 4 months ago. Patient reports no specific plan but states "probably slit wrists like last time"  EMS gave 324 ASA and 1 Nitro tab. No improvement on CP.  EMS VITALS: Bp: 200/100 HR: 96 Spo2: 98% RA

## 2021-11-11 NOTE — ED Notes (Signed)
Patient changed into burgundy scrubs by [place] NT. Patient ambulates with assistance from bathroom to stretcher. Patient remains cooperative. Belongings placed in patient belongings bag. Security called to wand patient.

## 2021-11-11 NOTE — ED Notes (Signed)
Attempted urine sample at this time. Patient states he is unable to provide sample at this time

## 2021-11-11 NOTE — ED Provider Notes (Signed)
[facility] EMERGENCY DEPARTMENT Provider Note   CSN: [id number] Arrival date & time: 11/11/21  1959     History  Chief Complaint  Patient presents with   Chest Pain   Suicidal    Mylan Schwarz is a 71 y.o. male.  Pt is a 71 yo bm with a hx of htn, dm, hyperlipidemia, HIV, crack cocaine abuse, homeless status, depression, CAD, afib, and PE.  [facility] and called EMS due to CP and SI.  Pt has not been compliant with his meds.  He has them in a locker at the shelter, but does not go there every night.  Pt presents to the ED frequently for these complaints.  He was last here on 1/28 and was cardiac and psych cleared.  EMS gave pt asa and nitro without improvement in sx.      Home Medications Prior to Admission medications   Medication Sig Start Date End Date Taking? Authorizing Provider  albuterol (VENTOLIN HFA) 108 (90 Base) MCG/ACT inhaler Inhale 1-2 puffs into the lungs every 6 (six) hours as needed for wheezing or shortness of breath. 10/24/21   [name], MD  amLODipine (NORVASC) 10 MG tablet Take 1 tablet (10 mg total) by mouth daily. 10/24/21   [name], MD  aspirin EC 81 MG tablet Take 81 mg by mouth daily. Swallow whole.    [provider]  bictegravir-emtricitabine-tenofovir AF (BIKTARVY) 50-200-25 MG TABS tablet Take 1 tablet by mouth daily. 10/24/21   [name], MD  budesonide-formoterol [facility]) 160-4.5 MCG/ACT inhaler Inhale 2 puffs into the lungs in the morning and at bedtime. 10/24/21   [name], MD  buPROPion (WELLBUTRIN XL) 150 MG 24 hr tablet Take 1 tablet (150 mg total) by mouth every morning. 10/24/21   [name], MD  hydrALAZINE (APRESOLINE) 25 MG tablet Take 3 tablets (75 mg total) by mouth 3 (three) times daily. 10/24/21 12/23/21  [name], MD  insulin glargine (LANTUS SOLOSTAR) 100 UNIT/ML Solostar Pen Inject 30 Units into the skin daily. 10/24/21   [name],  MD  insulin lispro (HUMALOG KWIKPEN) 100 UNIT/ML KwikPen Inject 4 Units into the skin 3 (three) times daily with meals. 10/24/21   [name], MD  Insulin Pen Needle 31G X 5 MM MISC Use with insulin pens Patient taking differently: 1 each by Other route See admin instructions. Use with insulin pens 10/24/21   [name], MD  isosorbide mononitrate (IMDUR) 60 MG 24 hr tablet Take 2 tablets (120 mg total) by mouth daily. 10/24/21   [name], MD  lidocaine (LIDODERM) 5 % Place 1 patch onto the skin daily as needed (For chestwall pain). Remove & Discard patch within 12 hours or as directed by MD 10/24/21   [name], MD  nitroGLYCERIN (NITROSTAT) 0.4 MG SL tablet Place 1 tablet (0.4 mg total) under the tongue every 5 (five) minutes as needed for chest pain. 10/24/21   [name], MD  pantoprazole (PROTONIX) 40 MG tablet Take 1 tablet (40 mg total) by mouth daily as needed (for heartburn). Patient not taking: Reported on 11/04/2021 10/24/21   [name], MD  traZODone (DESYREL) 50 MG tablet Take 1 tablet (50 mg total) by mouth at bedtime as needed for sleep. 10/24/21   [name], MD      Allergies    Patient has no known allergies.    Review  of Systems   Review of Systems  Cardiovascular:  Positive for chest pain.  Psychiatric/Behavioral:  Positive for suicidal ideas.   All other systems reviewed and are negative.  Physical Exam Updated Vital Signs BP (!) 154/77 (BP Location: Right Arm)    Pulse 86    Temp 98.6 F (37 C) (Oral)    Resp 19    SpO2 96%  Physical Exam Vitals and nursing note reviewed.  Constitutional:      Appearance: He is well-developed.  HENT:     Head: Normocephalic and atraumatic.  Eyes:     Extraocular Movements: Extraocular movements intact.     Pupils: Pupils are equal, round, and reactive to light.  Cardiovascular:     Rate and Rhythm: Normal rate and regular rhythm.     Heart sounds: Normal heart sounds.  Pulmonary:      Effort: Pulmonary effort is normal.     Breath sounds: Normal breath sounds.  Abdominal:     General: Bowel sounds are normal.     Palpations: Abdomen is soft.  Musculoskeletal:        General: Normal range of motion.     Cervical back: Normal range of motion and neck supple.  Skin:    General: Skin is warm.     Capillary Refill: Capillary refill takes less than 2 seconds.  Neurological:     General: No focal deficit present.     Mental Status: He is alert and oriented to person, place, and time.  Psychiatric:        Mood and Affect: Mood normal.        Behavior: Behavior normal.    ED Results / Procedures / Treatments   Labs (all labs ordered are listed, but only abnormal results are displayed) Labs Reviewed  CBC - Abnormal; Notable for the following components:      Result Value   RBC 3.97 (*)    Hemoglobin 10.8 (*)    HCT 33.3 (*)    All other components within normal limits  COMPREHENSIVE METABOLIC PANEL - Abnormal; Notable for the following components:   Glucose, Bld 415 (*)    BUN 45 (*)    Creatinine, Ser 2.87 (*)    Calcium 8.5 (*)    Albumin 3.3 (*)    GFR, Estimated 23 (*)    All other components within normal limits  LIPASE, BLOOD - Abnormal; Notable for the following components:   Lipase 120 (*)    All other components within normal limits  TROPONIN I (HIGH SENSITIVITY) - Abnormal; Notable for the following components:   Troponin I (High Sensitivity) 23 (*)    All other components within normal limits  RESP PANEL BY RT-PCR (FLU A&B, COVID) ARPGX2  URINALYSIS, ROUTINE W REFLEX MICROSCOPIC  RAPID URINE DRUG SCREEN, HOSP PERFORMED  TROPONIN I (HIGH SENSITIVITY)    EKG None  Radiology CT ABDOMEN PELVIS WO CONTRAST  Result Date: 11/11/2021 CLINICAL DATA:  Pancreatitis suspected EXAM: CT ABDOMEN AND PELVIS WITHOUT CONTRAST TECHNIQUE: Multidetector CT imaging of the abdomen and pelvis was performed following the standard protocol without IV contrast.  RADIATION DOSE REDUCTION: This exam was performed according to the departmental dose-optimization program which includes automated exposure control, adjustment of the mA and/or kV according to patient size and/or use of iterative reconstruction technique. COMPARISON:  10/08/2021 FINDINGS: Lower chest: Scarring in the lung bases.  No acute abnormality. Hepatobiliary: No focal hepatic abnormality. Subtle high-density material layering in the gallbladder could reflect stones or  sludge. Pancreas: No focal abnormality or ductal dilatation. Spleen: No focal abnormality.  Normal size. Adrenals/Urinary Tract: No adrenal abnormality. No focal renal abnormality. No stones or hydronephrosis. Urinary bladder is unremarkable. Stomach/Bowel: Moderate stool burden throughout the colon. Stomach, large and small bowel grossly unremarkable. Vascular/Lymphatic: Aortic atherosclerosis. No evidence of aneurysm or adenopathy. Reproductive: No visible focal abnormality. Other: No free fluid or free air. Musculoskeletal: No acute bony abnormality. Postoperative changes in the lower lumbar spine. Degenerative changes throughout the lumbar spine. IMPRESSION: No CT evidence of pancreatitis. Subtle high-density material layering in the gallbladder could reflect stones or sludge. Moderate stool burden in the colon. Electronically Signed   By: Rolm Baptise M.D.   On: 11/11/2021 22:14   DG Chest 2 View  Result Date: 11/11/2021 CLINICAL DATA:  Chest pain. EXAM: CHEST - 2 VIEW COMPARISON:  11/03/2021 prior studies FINDINGS: Cardiomediastinal silhouette is unchanged. CABG changes and LEFT atrial clip again noted. There is no evidence of focal airspace disease, pulmonary edema, suspicious pulmonary nodule/mass, pleural effusion, or pneumothorax. No acute bony abnormalities are identified. Cervical spine fusion hardware again noted. IMPRESSION: No active cardiopulmonary disease. Electronically Signed   By: Margarette Canada M.D.   On: 11/11/2021 21:26     Procedures Procedures    Medications Ordered in ED Medications - No data to display  ED Course/ Medical Decision Making/ A&P                           Medical Decision Making Amount and/or Complexity of Data Reviewed Labs: ordered. Radiology: ordered.   This patient presents to the ED for concern of mi, this involves an extensive number of treatment options, and is a complaint that carries with it a high risk of complications and morbidity.  The differential diagnosis includes CAD, PE, GERD, PNA   Co morbidities that complicate the patient evaluation  htn, dm, hyperlipidemia, HIV, crack cocaine abuse, homeless status, depression, CAD, afib, and PE   Additional history obtained:  Additional history obtained from epic    Lab Tests:  I Ordered, and personally interpreted labs.  The pertinent results include:  CBC with chronic anemia.  Likely due to CKD.  CMP with CKD 4(GFR 23).  Glucose is 415.  He has been non compliant with meds.  His home insulin ordered.  Pt's Lipase is mildly elevated.   Imaging Studies ordered:  I ordered imaging studies including CXR and CT abd/pelvis I independently visualized and interpreted imaging which showed CXR:  nl.  CT abd/pelvis ordered due to elevated lipase with possible pancreatitis causing CP.  CT shows constipation I agree with the radiologist interpretation   Cardiac Monitoring:  The patient was maintained on a cardiac monitor.  I personally viewed and interpreted the cardiac monitored which showed an underlying rhythm of: nsr   Medicines ordered and prescription drug management:  I ordered medication including normal home meds  for htn and dm  Reevaluation of the patient after these medicines showed that the patient improved I have reviewed the patients home medicines and have made adjustments as needed   Test Considered:  CT A chest considered due to hx of PE.  However, pt is not tachycardic or hypoxic.   Critical  Interventions:  Due to suicidal thoughts, TTS ordered   Consultations Obtained:  I requested consultation with the TTS.  Consultation pending.  Problem List / ED Course:  CP:  atypical.  Pt has had multiple ED evaluations for  CP.  CP today is likely chronic.  No evidence of CAD. SI:  TTS consulted   Reevaluation:  After the interventions noted above, I reevaluated the patient and found that they have :stayed the same   Social Determinants of Health:  TTS eval.  Homeless.  Pt gets a stipend of $1500/month.  However, he spends it on drugs.   Dispostion:  After consideration of the diagnostic results and the patients response to treatment, I feel that the patent would benefit from TTS eval.  Outpatient follow up for his heart.    Final Clinical Impression(s) / ED Diagnoses Final diagnoses:  Atypical chest pain  Suicidal ideations  Non compliance w medication regimen  Hyperglycemia due to diabetes mellitus ([name])  CKD (chronic kidney disease) stage 4, GFR 15-29 ml/min [facility])    Rx / DC Orders ED Discharge Orders     None         Isla Pence, MD 11/11/21 2252

## 2021-11-12 ENCOUNTER — Encounter (HOSPITAL_COMMUNITY): Payer: Self-pay | Admitting: Registered Nurse

## 2021-11-12 DIAGNOSIS — F1420 Cocaine dependence, uncomplicated: Secondary | ICD-10-CM

## 2021-11-12 DIAGNOSIS — F1414 Cocaine abuse with cocaine-induced mood disorder: Secondary | ICD-10-CM

## 2021-11-12 DIAGNOSIS — Z5900 Homelessness unspecified: Secondary | ICD-10-CM

## 2021-11-12 LAB — URINALYSIS, ROUTINE W REFLEX MICROSCOPIC
Bilirubin Urine: NEGATIVE
Glucose, UA: >=500 mg/dL — AB
Ketones, ur: NEGATIVE mg/dL
Leukocytes,Ua: NEGATIVE
Nitrite: NEGATIVE
Protein, ur: >300 mg/dL — AB
Specific Gravity, Urine: 1.025 (ref 1.005–1.030)
pH: 5.5 (ref 5.0–8.0)

## 2021-11-12 LAB — URINALYSIS, MICROSCOPIC (REFLEX)

## 2021-11-12 LAB — CBG MONITORING, ED
Glucose-Capillary: 263 mg/dL — ABNORMAL HIGH (ref 70–99)
Glucose-Capillary: 288 mg/dL — ABNORMAL HIGH (ref 70–99)

## 2021-11-12 LAB — RAPID URINE DRUG SCREEN, HOSP PERFORMED
Amphetamines: NOT DETECTED
Barbiturates: NOT DETECTED
Benzodiazepines: NOT DETECTED
Cocaine: POSITIVE — AB
Opiates: NOT DETECTED
Tetrahydrocannabinol: NOT DETECTED

## 2021-11-12 LAB — TROPONIN I (HIGH SENSITIVITY): Troponin I (High Sensitivity): 26 ng/L — ABNORMAL HIGH (ref <18)

## 2021-11-12 MED ORDER — ONDANSETRON 4 MG PO TBDP
ORAL_TABLET | ORAL | Status: AC
  Filled 2021-11-12: qty 1

## 2021-11-12 MED ORDER — ONDANSETRON 4 MG PO TBDP
4.0000 mg | ORAL_TABLET | Freq: Three times a day (TID) | ORAL | Status: DC | PRN
  Administered 2021-11-12: 4 mg via ORAL

## 2021-11-12 NOTE — Discharge Instructions (Addendum)
Medicaid and Payee Assistance  Please go to the Department of Health and Human Services located at [address] in [place] for assistance with getting a payee and applying for [facility] [place], [place] [address]  [address], [place] [address] [phone]     For your behavioral health needs you are advised to follow up with the [facility].  They provide outpatient psychiatry, therapy and substance use disorder treatment.  Contact them at your earliest opportunity to schedule an intake appointment:       [facility]      [place], [place] [address]      [phone]   [facility] provides supportive services for the homeless.  Contact them at your convenience:       [facility]      [place],  [address]      [phone]    Department of Social Services: Adult Placement Services Adult Placement Services help aging, or disabled adults find appropriate living and health care arrangements. These services are available to individuals whose health, safety and wellbeing can no longer be maintained at home. Placement arrangements are made in adult care homes, nursing homes, other substitute homes, residential health care settings or institutions. Aging and disabled adults receive help to complete medical evaluations and financial applications and to locate and move to new settings. They also may receive counseling to help them adjust to change. Adult placement services also help aging and disabled adults in the following situations: Individuals unable to maintain themselves in their own homes independently or with available community or family supports. Individuals living in substitute homes, residential health care facilities or institutions and needing assistance in relocating due to changes in  level of care needed. Those who need assistance in returning to more independent living arrangements. Those who need assistance in adjusting to or maintaining their placements due to individual or family problems or a lack of resources. Adult Protective Services Adults with disabilities may be vulnerable to abuse, neglect and exploitation. [employer] of social services receive and evaluate reports to determine whether disabled adults are in need of protective services. County agencies protect adults by: Receiving reports and evaluating the need for protective services Planning with the disabled adult, family or caregiver to identify and prevent abuse, neglect or exploitation Reporting evidence of mistreatment to the [employer] and various regulatory agencies Initiating court action as necessary to protect the adult Mobilizing essential services on behalf of the disabled adult Disabled adults reported to be abused, neglected or exploited and in need of protective services are eligible to receive this service regardless of income.   DSS steps to facility admission.  See DSS Website for full details:   Jump ahead to these sections: Step 1: Why Do You Want [facility]? Step 2: Provide Necessary Paperwork Step 3: Complete Application(s) Step 4: Start Medicaid Application if Needed Step 5: Meet with Facility Staff to Complete Admission Agreement  Step 6: Moving Day

## 2021-11-12 NOTE — ED Notes (Signed)
Pt talked to TTS at this time.

## 2021-11-12 NOTE — BH Assessment (Signed)
Comprehensive Clinical Assessment (CCA) Note  11/12/2021 Michael [name] [id number]  Discharge Disposition: Lindon Romp, NP, reviewed pt's chart and information and determined pt should receive continuous assessment at [facility] and be re-assessed by psychiatry in the morning. This information was relayed to pt's team at [place].  The patient demonstrates the following risk factors for suicide: Chronic risk factors for suicide include: psychiatric disorder of Cocaine-induced depressive disorder, With severe use disorder, substance use disorder, and previous suicide attempts , most recently several months - over a year ago . Acute risk factors for suicide include: family or marital conflict and social withdrawal/isolation. Protective factors for this patient include: hope for the future. Considering these factors, the overall suicide risk at this point appears to be high. Patient is not appropriate for outpatient follow up.  Therefore, a 1:1 sitter is recommended for suicide precautions.  [place] ED from 11/11/2021 in [facility] ED from 11/03/2021 in [facility] [place] [facility] DEPT ED from 10/31/2021 in [place] CATEGORY High Risk High Risk Error: Question 1 not populated     Chief Complaint:  Chief Complaint  Patient presents with   Chest Pain   Suicidal   Visit Diagnosis: F14.24, Cocaine-induced depressive disorder, With severe use disorder  CCA Screening, Triage and Referral (STR) Michael [name] is a 71 year old patient who was brought to [facility] after he called EMS. When asked why he came to the ED tonight, pt states, "I don't feel too good. I'm in pain and short of breath. Suicidal."   Pt acknowledges he's experiencing SI. He states he's attempted to kill himself in the past, most recently 4 months ago by cutting his wrist. When asked if he has a plan, pt denies this; though, of note, he  told his RN that he would "probably slit my wrists like last time." Pt denies HI, AH, NSSIB, access to guns/weapons, or engagement with the legal system. Pt shares he has been experiencing VH. He states he smoked around $200 of cocaine yesterday; he states he typical uses cocaine several times/month.  Pt is oriented x5. His recent/remote memory is intact. Pt was cooperative throughout the assessment process. Pt's insight, judgement, and impulse control is impaired at this time.  Patient Reported Information How did you hear about [place]? Self  What Is the Reason for Your Visit/Call Today? Pt states, "I don't feel too good. I'm in pain and short of breath. Suicidal." Pt acknowledges he's experiencing SI. He states he's attempted to kill himself in the past, most recently 4 months ago by cutting his wrist. When asked if he has a plan, pt denies this; though, of note, he told his RN that he would "probably slit my wrists like last time." Pt denies HI, AH, NSSIB, access to guns/weapons, or engagement with the legal system. Pt shares he has been experiencing VH. He states he smoked around $200 of cocaine yesterday; he states he typical uses cocaine several times/month.  How Long Has This Been Causing You Problems? > than 6 months  What Do You Feel Would Help You the Most Today? Alcohol or Drug Use Treatment; Housing Assistance; Stress Management; Medication(s); Treatment for Depression or other mood problem   Have You Recently Had Any Thoughts About Hurting Yourself? Yes  Are You Planning to Commit Suicide/Harm Yourself At This time? No   Have you Recently Had Thoughts About [place]? No  Are You Planning to Harm Someone at This  Time? No  Explanation: No data recorded  Have You Used Any Alcohol or Drugs in the Past 24 Hours? Yes  How Long Ago Did You Use Drugs or Alcohol? No data recorded What Did You Use and How Much? Pt reports smoking $200 in cocaine   Do You Currently Have a  Therapist/Psychiatrist? No  Name of Therapist/Psychiatrist: No data recorded  Have You Been Recently Discharged From Any Office Practice or Programs? No  Explanation of Discharge From Practice/Program: No data recorded    CCA Screening Triage Referral Assessment Type of Contact: Tele-Assessment  Telemedicine Service Delivery: Telemedicine service delivery: This service was provided via telemedicine using a 2-way, interactive audio and video technology  Is this Initial or Reassessment? Initial Assessment  Date Telepsych consult ordered in [facility]:  11/12/21  Time Telepsych consult ordered in [facility]:  0005  Location of Assessment: [facility] ED  Provider Location: [facility] Assessment Services   Collateral Involvement: Pt declined for clinician to contact family and/or friends to obtain additional information. Pt denies having supports.   Does Patient Have a Stage manager Guardian? No data recorded Name and Contact of Legal Guardian: No data recorded If Minor and Not Living with Parent(s), Who has Custody? N/A  Is CPS involved or ever been involved? Never  Is APS involved or ever been involved? Never   Patient Determined To Be At Risk for Harm To Self or Others Based on Review of Patient Reported Information or Presenting Complaint? Yes, for Self-Harm  Method: No data recorded Availability of Means: No data recorded Intent: No data recorded Notification Required: No data recorded Additional Information for Danger to Others Potential: No data recorded Additional Comments for Danger to Others Potential: No data recorded Are There Guns or Other Weapons in Your Home? No data recorded Types of Guns/Weapons: No data recorded Are These Weapons Safely Secured?                            No data recorded Who Could Verify You Are Able To Have These Secured: No data recorded Do You Have any Outstanding Charges, Pending Court Dates, Parole/Probation? No data recorded Contacted To Inform of  Risk of Harm To Self or Others: Other: Comment (No one to contact)    Does Patient Present under Involuntary Commitment? No  IVC Papers Initial File Date: No data recorded  [place] of Residence: [place]   Patient Currently Receiving the Following Services: Not Receiving Services   Determination of Need: Urgent (48 hours)   Options For Referral: Medication Management; Outpatient Therapy; Other: Comment (Continuous Assessment at [facility])     CCA Biopsychosocial Patient Reported Schizophrenia/Schizoaffective Diagnosis in Past: No   Strengths: Pt is able to identify that he needs assistance with his physical/mental health. Pt answers the questions posed.   Mental Health Symptoms Depression:   Fatigue; Hopelessness; Sleep (too much or little); Worthlessness; Tearfulness; Difficulty Concentrating; Irritability; Change in energy/activity (Guilt, isolation.)   Duration of Depressive symptoms:  Duration of Depressive Symptoms: Greater than two weeks   Mania:   None   Anxiety:    Sleep; Worrying; Tension; Difficulty concentrating   Psychosis:   Hallucinations   Duration of Psychotic symptoms:  Duration of Psychotic Symptoms: Less than six months   Trauma:   None   Obsessions:   None   Compulsions:   None   Inattention:   None   Hyperactivity/Impulsivity:   None   Oppositional/Defiant Behaviors:  None   Emotional Irregularity:   Recurrent suicidal behaviors/gestures/threats; Mood lability; Potentially harmful impulsivity   Other Mood/Personality Symptoms:   None noted    Mental Status Exam Appearance and self-care  Stature:   Average   Weight:   Average weight   Clothing:   -- (Pt is dressed in hospital scrubs)   Grooming:   Normal   Cosmetic use:   None   Posture/gait:   Other (Comment) (Pt is lying down in his hospital bed)   Motor activity:   Not Remarkable   Sensorium  Attention:   Normal   Concentration:   Normal    Orientation:   X5   Recall/memory:   Normal   Affect and Mood  Affect:   Depressed   Mood:   Depressed   Relating  Eye contact:   Normal   Facial expression:   Depressed   Attitude toward examiner:   Cooperative   Thought and Language  Speech flow:  Soft; Slow   Thought content:   Appropriate to Mood and Circumstances   Preoccupation:   None   Hallucinations:   Auditory   Organization:  No data recorded  [employer] of Knowledge:   Average   Intelligence:   Average   Abstraction:   Normal   Judgement:   Poor   Reality Testing:   Adequate   Insight:   Poor   Decision Making:   Only simple   Social Functioning  Social Maturity:   Isolates   Social Judgement:   "Street Smart"   Stress  Stressors:   Housing; Illness   Coping Ability:   Overwhelmed; Deficient supports   Skill Deficits:   Decision making; Self-control   Supports:   Support needed     Religion: Religion/Spirituality Are You A Religious Person?: No What is Your Religious Affiliation?: [employer] How Might This Affect Treatment?: Not assessed  Leisure/Recreation: Leisure / Recreation Do You Have Hobbies?: No  Exercise/Diet: Exercise/Diet Do You Exercise?: No Have You Gained or Lost A Significant Amount of Weight in the Past Six Months?: No Number of Pounds Lost?:  (Per history, pt recently lost 19 lbs.) Do You Follow a Special Diet?: No Do You Have Any Trouble Sleeping?: Yes Explanation of Sleeping Difficulties: Pt reports getting 2 hours of sleep/night. Pt reports he's homeless and sleeps outside on the streets.   CCA Employment/Education Employment/Work Situation: Employment / Work Situation Employment Situation: On disability Why is Patient on Disability: Pt reports he got injured on the job. Pt has medical illnesses. How Long has Patient Been on Disability: Pt reports he was injured on July 07, 1984. Patient's Job has Been Impacted  by Current Illness: No Has Patient ever Been in the [employer]?: No  Education: Education Is Patient Currently Attending School?: No Last Grade Completed: 12 Did You Attend College?: No Did You Have An Individualized Education Program (IIEP): No Did You Have Any Difficulty At School?: No Patient's Education Has Been Impacted by Current Illness: No   CCA Family/Childhood History Family and Relationship History: Family history Marital status: Widowed Widowed, when?: Pt reports his wife died two years ago. Does patient have children?: Yes How many children?: 2 How is patient's relationship with their children?: Pt reports he does not want to be a burden to his children. Pt reports he's not sure if his kids know about his housing and health issues.  Childhood History:  Childhood History By whom was/is the patient raised?: Both parents Did patient  suffer any verbal/emotional/physical/sexual abuse as a child?: No Did patient suffer from severe childhood neglect?: No Has patient ever been sexually abused/assaulted/raped as an adolescent or adult?: No Was the patient ever a victim of a crime or a disaster?: No Witnessed domestic violence?: No Has patient been affected by domestic violence as an adult?: No  Child/Adolescent Assessment:     CCA Substance Use Alcohol/Drug Use: Alcohol / Drug Use Pain Medications: See MAR Prescriptions: See MAR Over the Counter: See MAR History of alcohol / drug use?: Yes Longest period of sobriety (when/how long): Per chart: two months. Negative Consequences of Use: Personal relationships, Financial Withdrawal Symptoms: None Substance #1 Name of Substance 1: Crack-cocaine 1 - Age of First Use: 18's 1 - Amount (size/oz): $200 worth yesterday 1 - Frequency: Ongoing 1 - Duration: UTA 1 - Last Use / Amount: Yesterday 1 - Method of Aquiring: Purchase 1- Route of Use: Smoke                       ASAM's:  Six Dimensions of  Multidimensional Assessment  Dimension 1:  Acute Intoxication and/or Withdrawal Potential:   Dimension 1:  Description of individual's past and current experiences of substance use and withdrawal: Ongoing use  Dimension 2:  Biomedical Conditions and Complications:   Dimension 2:  Description of patient's biomedical conditions and  complications: Recent chest pain  Dimension 3:  Emotional, Behavioral, or Cognitive Conditions and Complications:  Dimension 3:  Description of emotional, behavioral, or cognitive conditions and complications: Depression  Dimension 4:  Readiness to Change:  Dimension 4:  Description of Readiness to Change criteria: Pt does not express a desire to quit using substances  Dimension 5:  Relapse, Continued use, or Continued Problem Potential:  Dimension 5:  Relapse, continued use, or continued problem potential critiera description: Pt does not express a desire to quit using substances  Dimension 6:  Recovery/Living Environment:  Dimension 6:  Recovery/Iiving environment criteria description: Pt is homeless  ASAM Severity Score: ASAM's Severity Rating Score: 14  ASAM Recommended Level of Treatment: ASAM Recommended Level of Treatment: Level II Intensive Outpatient Treatment   Substance use Disorder (SUD) Substance Use Disorder (SUD)  Checklist Symptoms of Substance Use: Continued use despite having a persistent/recurrent physical/psychological problem caused/exacerbated by use, Continued use despite persistent or recurrent social, interpersonal problems, caused or exacerbated by use, Evidence of tolerance, Presence of craving or strong urge to use, Substance(s) often taken in larger amounts or over longer times than was intended  Recommendations for Services/Supports/Treatments: Recommendations for Services/Supports/Treatments Recommendations For Services/Supports/Treatments: Other (Comment), Medication Management, Individual Therapy (Pt to receive continuous assessment at  [facility] and be re-assessed by psychiatry in the morning)  Discharge Disposition: Lindon Romp, NP, reviewed pt's chart and information and determined pt should receive continuous assessment at [facility] and be re-assessed by psychiatry in the morning. This information was relayed to pt's team at [facility].  DSM5 Diagnoses: Patient Active Problem List   Diagnosis Date Noted   Iron deficiency anemia 04/04/2021   Atypical chest pain 04/03/2021   Homelessness 12/13/2019   Cellulitis 12/07/2019   Osteomyelitis ([place]) 07/17/2019   Abscess or cellulitis of toe, right    Toe pain, right-second    MDD (major depressive disorder), recurrent episode, severe ([place]) 06/15/2019   Respiratory distress 04/12/2019   Pneumonia due to severe acute respiratory syndrome coronavirus 2 (SARS-CoV-2) 04/11/2019   COVID-19 virus infection 03/19/2019   Chest pain 10/03/2018   Malnutrition of moderate degree  09/21/2018   MDD (major depressive disorder), recurrent episode, moderate ([name])    Type II diabetes mellitus with renal manifestations ([place]) 05/04/2018   GERD (gastroesophageal reflux disease) 05/04/2018   S/P CABG (coronary artery bypass graft)    Left testicular pain    Depression 02/20/2018   Epididymo-orchitis, acute 02/20/2018   Essential hypertension 02/20/2018   Precordial chest pain 02/20/2018   AKI (acute kidney injury) ([place]) 02/14/2018   HCAP (healthcare-associated pneumonia) 02/14/2018   History of pulmonary embolism 07/04/2017   Acute hyponatremia 05/11/2017   Hyperglycemia due to type 2 diabetes mellitus ([place]) 05/11/2017   CHF (congestive heart failure) ([place]) 04/01/2017   Hepatitis C 12/30/2016   Substance induced mood disorder ([place]) 11/23/2016   Chronic ischemic heart disease 11/12/2016   Paroxysmal A-fib ([place]) 11/12/2016   Back pain 04/18/2016   S/P carotid endarterectomy 11/15/2015   MDD (major depressive disorder), recurrent severe, without psychosis ([place]) 09/09/2015   Cocaine-induced mood  disorder ([place]) 08/14/2015   Cocaine abuse with cocaine-induced mood disorder ([place]) 08/14/2015   Gout 07/10/2015   Acute renal failure superimposed on stage 3 chronic kidney disease ([place]) 03/06/2015   Anemia 03/06/2015   Chronic kidney disease, stage III (moderate) ([place]) 03/06/2015   Hypoglycemia    Encounter for general adult medical examination with abnormal findings 02/09/2015   Cocaine use disorder, severe, dependence ([place]) 12/13/2014   Substance or medication-induced depressive disorder with onset during withdrawal ([place]) 12/13/2014   Severe recurrent major depressive disorder with psychotic features ([place]) 12/12/2014   Major depressive disorder, recurrent, severe without psychotic features ([place])    Cervicalgia 06/28/2014   Lumbar radiculopathy, chronic 06/28/2014   Asthma, chronic 02/03/2014   3-vessel CAD 06/24/2013   ED (erectile dysfunction) of organic origin 07/07/2012   Hypertension goal BP (blood pressure) < 140/80 04/29/2012   Chondromalacia of left knee 03/19/2012   Hyperlipidemia with target LDL less than 100 02/12/2012   Fibromyalgia 02/12/2012   Cocaine use disorder ([place]) 01/10/2012    Class: Acute   HIV (human immunodeficiency virus infection) ([place]) 08/27/2011   Uncontrolled type 2 diabetes with neuropathy 10/17/2000     Referrals to Alternative Service(s): Referred to Alternative Service(s):   Place:   Date:   Time:    Referred to Alternative Service(s):   Place:   Date:   Time:    Referred to Alternative Service(s):   Place:   Date:   Time:    Referred to Alternative Service(s):   Place:   Date:   Time:     Dannielle Burn, LMFT

## 2021-11-12 NOTE — ED Notes (Signed)
Pt went to the bathroom and started vomiting. Pt states he became nauseated as soon as he sat on the toilet. Joshua notified. Zofran ODT given. Will continue to monitor.

## 2021-11-12 NOTE — BH Assessment (Signed)
[facility] Assessment Progress Note   Per Shuvon Rankin, NP, this pt does not require psychiatric hospitalization at this time.  Pt is psychiatrically cleared.  Discharge instructions advise pt to follow up with [facility] for [facility]ry, therapy and substance use disorder treatment.  Pt's nurse, Blima Singer, has been notified.  Jalene Mullet, [place] Triage Specialist [phone]

## 2021-11-12 NOTE — ED Notes (Signed)
Breakfast Orders Placed °

## 2021-11-12 NOTE — ED Provider Notes (Signed)
Psychiatry has cleared patient for discharge.   Sherwood Gambler, MD 11/12/21 [phone]

## 2021-11-12 NOTE — Consult Note (Signed)
Telepsych Consultation   Reason for Consult:  suicidal ideation Referring Physician:  Isla Pence, MD Location of Patient: [facility] ED Location of Provider: Other: [facility]  Patient Identification: Michael [name] MRN:  [id number] Principal Diagnosis: Cocaine abuse with cocaine-induced mood disorder ([place]) Diagnosis:  Principal Problem:   Cocaine abuse with cocaine-induced mood disorder ([place]) Active Problems:   Cocaine abuse, continuous ([place])   Cocaine use disorder, moderate, dependence ([place])   Total Time spent with patient: 30 minutes  Subjective:   Michael [name] is a 71 y.o. male patient admitted to [facility] ED.  HPI:  Michael [name], 71 y.o., male patient seen via tele health by this provider, consulted with Dr. Ernie [name]; and chart reviewed on 11/12/21.  On evaluation Michael [name] reports he came to the emergency room because he was having some chest pain and suicidal thoughts.  Patient states he is currently homeless and staying in the shelter.  Patient reporting that he does cocaine at least twice a month once he receives his check.  Patient stating that he has been to [facility] but can never get anyone to help him as far as taking the steps to get into an assisted living or a family care home.  Patient also states he needs to have assistance with his check so that he does not thought away on drugs.  Patient also states that he does not have a primary care physician.  Patient reports he is not compliant with any psychotropic medications or follow-ups once he has been discharged from the hospital related to no transportation.  Discussed community resources and other options with patient.  Informed would order a social work consult to assist with resources to help him apply for family care home/assisted living.  I also discussed resources for primary care provider.  At this time patient denies suicidal/self-harm/homicidal ideation, psychosis, paranoia. During evaluation Michael [name] is sitting on side of bed in no acute distress.  He is alert, oriented x 4, calm and cooperative.  His mood is depressed but euthymic with congruent affect.  He does not appear to be responding to internal/external stimuli or delusional thoughts.  Patient denies suicidal/self-harm/homicidal ideation, psychosis, and paranoia.  Patient answered question appropriately.  Patient encouraged to follow-up with resources given for primary care provider, outpatient psychiatric services, and resources through DSS to assist with applying for Medicaid to help with placement into a assisted living/family care home.  At this time Michael [name] is educated and verbalizes understanding of mental health resources and other crisis services in the community. He is instructed to call 911 and present to the nearest emergency room should he/she experience any suicidal/homicidal ideation, auditory/visual/hallucinations, or detrimental worsening of his mental health condition.  He was a also advised by Probation officer that he could call the toll-free phone on insurance card to assist with identifying in network counselors and agencies or number on back of Medicaid card to speak with care coordinator   Past Psychiatric History: Depression, polysubstance abuse, substance-induced mood disorder, major depression  Risk to Self: No Risk to Others: Known Prior Inpatient Therapy: Yes Prior Outpatient Therapy: Yes  Past Medical History:  Past Medical History:  Diagnosis Date   A-fib ([place])    Asthma    No PFTs, history of childhood asthma   CAD (coronary artery disease)    a. 06/2013 STEMI/PCI ([facility]): LAD w/ thrombus (treated with BMS), mid 75%, D2 75%; LCX OM2 75%; RCA small, PDA 95%, PLV 95%;  b. 10/2013 Cath/PCI: ISR w/in LAD (Promus DES x 2), borderline OM2 lesion;  c. 01/2014 MV: Intermediate risk, medium-sized distal ant wall infarct w/ very small amt of peri-infarct ischemia. EF 60%.   Cellulitis 04/2014   left  facial   Cellulitis and abscess of toe of right foot 12/08/2019   Chondromalacia of medial femoral condyle    Left knee MRI 04/28/12: Chondromalacia of the medial femoral condyle with slight peripheral degeneration of the meniscocapsular junction of the medial meniscus; followed by sports medicine   Collagen vascular disease ([place])    Crack cocaine use    for 20+ years, has been enrolled in detox programs in the past   Depression    with history of hospitalization for suicidal ideation   Diabetes mellitus 2002   Diagnosed in 2002, started insulin in 2012   Gout    Gout 04/28/2012   Headache(784.0)    CT head 08/2011: Periventricular and subcortical white matter hypodensities are most in keeping with chronic microangiopathic change   HIV infection [facility]) Nov 2012   Followed by Dr. Johnnye [name]   Hyperlipidemia    Hypertension    Pulmonary embolism [facility])     Past Surgical History:  Procedure Laterality Date   AMPUTATION Right 07/21/2019   Procedure: RIGHT SECOND TOE AMPUTATION;  Surgeon: Michael Minion, MD;  Location: [place];  Service: Orthopedics;  Laterality: Right;   BACK SURGERY     1988   BOWEL RESECTION     CARDIAC SURGERY     CERVICAL SPINE SURGERY     " rods in my neck "   CORONARY ARTERY BYPASS GRAFT     CORONARY STENT PLACEMENT     NM MYOCAR PERF WALL MOTION  12/27/2011   normal   SPINE SURGERY     Family History:  Family History  Problem Relation Age of Onset   Diabetes Mother    Hypertension Mother    Hyperlipidemia Mother    Diabetes Father    Cancer Father    Hypertension Father    Diabetes Brother    Heart disease Brother    Diabetes Sister    Colon cancer Neg Hx    Family Psychiatric  History: None reported Social History:  Social History   Substance and Sexual Activity  Alcohol Use No   Alcohol/week: 4.0 standard drinks   Types: 2 Cans of beer, 2 Shots of liquor per week     Social History   Substance and Sexual Activity  Drug Use Not Currently    Frequency: 4.0 times per week   Types: "Crack" cocaine, Cocaine   Comment: Recent use of crack    Social History   Socioeconomic History   Marital status: Widowed    Spouse name: Not on file   Number of children: 2   Years of education: 75   Highest education level: 12th grade  Occupational History    Employer: UNEMPLOYED    Comment: 04/2016  Tobacco Use   Smoking status: Never   Smokeless tobacco: Never  Vaping Use   Vaping Use: Never used  Substance and Sexual Activity   Alcohol use: No    Alcohol/week: 4.0 standard drinks    Types: 2 Cans of beer, 2 Shots of liquor per week   Drug use: Not Currently    Frequency: 4.0 times per week    Types: "Crack" cocaine, Cocaine    Comment: Recent use of crack   Sexual activity: Yes    Comment: DECLINED  CONDOMS  Other Topics Concern   Not on file  Social History Narrative   Currently staying with a friend in [place].  Was staying @ local motel until a few days ago - left b/c of bed bugs.   P[facility] up from [facility].  Not followed by a psychiatrist.   Social Determinants of Health   Financial Resource Strain: Not on file  Food Insecurity: Not on file  Transportation Needs: Not on file  Physical Activity: Not on file  Stress: Not on file  Social Connections: Not on file   Additional Social History:    Allergies:  No Known Allergies  Labs:  Results for orders placed or performed during the hospital encounter of 11/11/21 (from the past 48 hour(s))  Troponin I (High Sensitivity)     Status: Abnormal   Collection Time: 11/11/21 12:02 AM  Result Value Ref Range   Troponin I (High Sensitivity) 26 (H) <18 ng/L    Comment: (NOTE) Elevated high sensitivity troponin I (hsTnI) values and significant  changes across serial measurements may suggest ACS but many other  chronic and acute conditions are known to elevate hsTnI results.  Refer to the "Links" section for chest pain algorithms and additional  guidance. Performed at [facility], [name] [address], [place], [place] [address]   Urinalysis, Routine w reflex microscopic     Status: Abnormal   Collection Time: 11/11/21 12:03 AM  Result Value Ref Range   Color, Urine YELLOW YELLOW   APPearance CLEAR CLEAR   Specific Gravity, Urine 1.025 1.005 - 1.030   pH 5.5 5.0 - 8.0   Glucose, UA >=500 (A) NEGATIVE mg/dL   Hgb urine dipstick MODERATE (A) NEGATIVE   Bilirubin Urine NEGATIVE NEGATIVE   Ketones, ur NEGATIVE NEGATIVE mg/dL   Protein, ur >300 (A) NEGATIVE mg/dL   Nitrite NEGATIVE NEGATIVE   Leukocytes,Ua NEGATIVE NEGATIVE    Comment: Performed at [facility] [address] [place], [place] [address]  Urine rapid drug screen (hosp performed)     Status: Abnormal   Collection Time: 11/11/21 12:03 AM  Result Value Ref Range   Opiates NONE DETECTED NONE DETECTED   Cocaine POSITIVE (A) NONE DETECTED   Benzodiazepines NONE DETECTED NONE DETECTED   Amphetamines NONE DETECTED NONE DETECTED   Tetrahydrocannabinol NONE DETECTED NONE DETECTED   Barbiturates NONE DETECTED NONE DETECTED    Comment: (NOTE) DRUG SCREEN FOR MEDICAL PURPOSES ONLY.  IF CONFIRMATION IS NEEDED FOR ANY PURPOSE, NOTIFY LAB WITHIN 5 DAYS.  LOWEST DETECTABLE LIMITS FOR URINE DRUG SCREEN Drug Class                     Cutoff (ng/mL) Amphetamine and metabolites    1000 Barbiturate and metabolites    200 Benzodiazepine                 962 Tricyclics and metabolites     300 Opiates and metabolites        300 Cocaine and metabolites        300 THC                            50 Performed at [facility], [place] [address], [place], [place] [address]   Urinalysis, Microscopic (reflex)     Status: Abnormal   Collection Time: 11/11/21 12:03 AM  Result Value Ref Range   RBC / HPF 0-5 0 -  5 RBC/hpf   WBC, UA 0-5 0 - 5 WBC/hpf   Bacteria, UA RARE (A) NONE SEEN   Squamous Epithelial / LPF 0-5 0 - 5   Mucus PRESENT    Hyaline Casts, UA PRESENT     Comment: Performed at [facility], [place] [address], [place], [place] [address]  Resp Panel by RT-PCR (Flu A&B, Covid) Nasopharyngeal Swab     Status: None   Collection Time: 11/11/21  8:17 PM   Specimen: Nasopharyngeal Swab; Nasopharyngeal(NP) swabs in vial transport medium  Result Value Ref Range   SARS Coronavirus 2 by RT PCR NEGATIVE NEGATIVE    Comment: (NOTE) SARS-CoV-2 target nucleic acids are NOT DETECTED.  The SARS-CoV-2 RNA is generally detectable in upper respiratory specimens during the acute phase of infection. The lowest concentration of SARS-CoV-2 viral copies this assay can detect is 138 copies/mL. A negative result does not preclude SARS-Cov-2 infection and should not be used as the sole basis for treatment or other patient management decisions. A negative result may occur with  improper specimen collection/handling, submission of specimen other than nasopharyngeal swab, presence of viral mutation(s) within the areas targeted by this assay, and inadequate number of viral copies(<138 copies/mL). A negative result must be combined with clinical observations, patient history, and epidemiological information. The expected result is Negative.  Fact Sheet for Patients:  EntrepreneurPulse.com.au  Fact Sheet for Healthcare Providers:  IncredibleEmployment.be  This test is no t yet approved or cleared by the [place] FDA and  has been authorized for detection and/or diagnosis of SARS-CoV-2 by FDA under an Emergency Use Authorization (EUA). This EUA will remain  in effect (meaning this test can be used) for the duration of the COVID-19 declaration under Section 564(b)(1) of the Act, 21 U.S.C.section 360bbb-3(b)(1), unless the authorization is terminated  or revoked sooner.       Influenza A by PCR NEGATIVE NEGATIVE   Influenza B by PCR NEGATIVE NEGATIVE    Comment: (NOTE) The Xpert Xpress SARS-CoV-2/FLU/RSV plus assay is intended as an aid in the  diagnosis of influenza from Nasopharyngeal swab specimens and should not be used as a sole basis for treatment. Nasal washings and aspirates are unacceptable for Xpert Xpress SARS-CoV-2/FLU/RSV testing.  Fact Sheet for Patients: EntrepreneurPulse.com.au  Fact Sheet for Healthcare Providers: IncredibleEmployment.be  This test is not yet approved or cleared by the [place] FDA and has been authorized for detection and/or diagnosis of SARS-CoV-2 by FDA under an Emergency Use Authorization (EUA). This EUA will remain in effect (meaning this test can be used) for the duration of the COVID-19 declaration under Section 564(b)(1) of the Act, 21 U.S.C. section 360bbb-3(b)(1), unless the authorization is terminated or revoked.  Performed at [facility], [place] [address], [place], [place] [address]   CBC     Status: Abnormal   Collection Time: 11/11/21  9:06 PM  Result Value Ref Range   WBC 5.2 4.0 - 10.5 K/uL   RBC 3.97 (L) 4.22 - 5.81 MIL/uL   Hemoglobin 10.8 (L) 13.0 - 17.0 g/dL   HCT 33.3 (L) 39.0 - 52.0 %   MCV 83.9 80.0 - 100.0 fL   MCH 27.2 26.0 - 34.0 pg   MCHC 32.4 30.0 - 36.0 g/dL   RDW 12.6 11.5 - 15.5 %   Platelets 176 150 - 400 K/uL   nRBC 0.0 0.0 - 0.2 %    Comment: Performed at [facility], [place],  [place] [address]  Troponin I (High Sensitivity)     Status: Abnormal   Collection Time: 11/11/21  9:06 PM  Result Value Ref Range   Troponin I (High Sensitivity) 23 (H) <18 ng/L    Comment: (NOTE) Elevated high sensitivity troponin I (hsTnI) values and significant  changes across serial measurements may suggest ACS but many other  chronic and acute conditions are known to elevate hsTnI results.  Refer to the "Links" section for chest pain algorithms and additional  guidance. Performed at [facility], [place] [address], [place], [place] [address]   Comprehensive metabolic panel     Status: Abnormal    Collection Time: 11/11/21  9:06 PM  Result Value Ref Range   Sodium 137 135 - 145 mmol/L   Potassium 4.1 3.5 - 5.1 mmol/L   Chloride 107 98 - 111 mmol/L   CO2 22 22 - 32 mmol/L   Glucose, Bld 415 (H) 70 - 99 mg/dL    Comment: Glucose reference range applies only to samples taken after fasting for at least 8 hours.   BUN 45 (H) 8 - 23 mg/dL   Creatinine, Ser 2.87 (H) 0.61 - 1.24 mg/dL   Calcium 8.5 (L) 8.9 - 10.3 mg/dL   Total Protein 6.7 6.5 - 8.1 g/dL   Albumin 3.3 (L) 3.5 - 5.0 g/dL   AST 19 15 - 41 U/L   ALT 14 0 - 44 U/L   Alkaline Phosphatase 86 38 - 126 U/L   Total Bilirubin 0.6 0.3 - 1.2 mg/dL   GFR, Estimated 23 (L) >60 mL/min    Comment: (NOTE) Calculated using the CKD-EPI Creatinine Equation (2021)    Anion gap 8 5 - 15    Comment: Performed at [facility], [place] [address], [place], [place] [address]  Lipase, blood     Status: Abnormal   Collection Time: 11/11/21  9:06 PM  Result Value Ref Range   Lipase 120 (H) 11 - 51 U/L    Comment: Performed at [facility] [address] [place], [place] [address]  POC CBG, ED     Status: Abnormal   Collection Time: 11/12/21  7:41 AM  Result Value Ref Range   Glucose-Capillary 263 (H) 70 - 99 mg/dL    Comment: Glucose reference range applies only to samples taken after fasting for at least 8 hours.  POC CBG, ED     Status: Abnormal   Collection Time: 11/12/21 11:59 AM  Result Value Ref Range   Glucose-Capillary 288 (H) 70 - 99 mg/dL    Comment: Glucose reference range applies only to samples taken after fasting for at least 8 hours.    Medications:  Current Facility-Administered Medications  Medication Dose Route Frequency Provider Last Rate Last Admin   albuterol (VENTOLIN HFA) 108 (90 Base) MCG/ACT inhaler 1-2 puff  1-2 puff Inhalation Q6H PRN Michael Pence, MD       amLODipine (NORVASC) tablet 10 mg  10 mg Oral Daily Michael Pence, MD   10 mg at 11/12/21 2426   aspirin EC tablet 81 mg  81 mg Oral  Daily Michael Pence, MD   81 mg at 11/12/21 0806   bictegravir-emtricitabine-tenofovir AF (BIKTARVY) 50-200-25 MG per tablet 1 tablet  1 tablet Oral Daily Michael Pence, MD   1 tablet at 11/12/21 0806   buPROPion (WELLBUTRIN XL) 24 hr tablet 150 mg  150 mg Oral q morning Michael Pence, MD   150 mg at 11/12/21 [phone]  hydrALAZINE (APRESOLINE) tablet 75 mg  75 mg Oral TID Michael Pence, MD   75 mg at 11/12/21 0806   insulin aspart (novoLOG) injection 4 Units  4 Units Subcutaneous TID WC Michael Pence, MD   4 Units at 11/12/21 1215   insulin glargine-yfgn (SEMGLEE) injection 30 Units  30 Units Subcutaneous Daily Michael Pence, MD   30 Units at 11/12/21 0809   isosorbide mononitrate (IMDUR) 24 hr tablet 120 mg  120 mg Oral Daily Michael Pence, MD   120 mg at 11/12/21 0805   mometasone-formoterol (DULERA) 200-5 MCG/ACT inhaler 2 puff  2 puff Inhalation BID Michael Pence, MD   2 puff at 11/12/21 0810   ondansetron (ZOFRAN-ODT) 4 MG disintegrating tablet            ondansetron (ZOFRAN-ODT) disintegrating tablet 4 mg  4 mg Oral Q8H PRN Sherwood Gambler, MD   4 mg at 11/12/21 2595   pantoprazole (PROTONIX) EC tablet 40 mg  40 mg Oral Daily PRN Michael Pence, MD   40 mg at 11/12/21 6387   traZODone (DESYREL) tablet 50 mg  50 mg Oral QHS PRN Michael Pence, MD       Current Outpatient Medications  Medication Sig Dispense Refill   albuterol (VENTOLIN HFA) 108 (90 Base) MCG/ACT inhaler Inhale 1-2 puffs into the lungs every 6 (six) hours as needed for wheezing or shortness of breath. 8.5 g 1   amLODipine (NORVASC) 10 MG tablet Take 1 tablet (10 mg total) by mouth daily. 60 tablet 2   aspirin EC 81 MG tablet Take 81 mg by mouth daily. Swallow whole.     bictegravir-emtricitabine-tenofovir AF (BIKTARVY) 50-200-25 MG TABS tablet Take 1 tablet by mouth daily. 30 tablet 11   budesonide-formoterol (SYMBICORT) 160-4.5 MCG/ACT inhaler Inhale 2 puffs into the lungs in the morning and at bedtime. 10.2 g  2   buPROPion (WELLBUTRIN XL) 150 MG 24 hr tablet Take 1 tablet (150 mg total) by mouth every morning. 30 tablet 3   hydrALAZINE (APRESOLINE) 25 MG tablet Take 3 tablets (75 mg total) by mouth 3 (three) times daily. 270 tablet 1   insulin glargine (LANTUS SOLOSTAR) 100 UNIT/ML Solostar Pen Inject 30 Units into the skin daily. 6 mL 3   insulin lispro (HUMALOG KWIKPEN) 100 UNIT/ML KwikPen Inject 4 Units into the skin 3 (three) times daily with meals. 15 mL 0   Insulin Pen Needle 31G X 5 MM MISC Use with insulin pens (Patient taking differently: 1 each by Other route See admin instructions. Use with insulin pens) 100 each 1   isosorbide mononitrate (IMDUR) 60 MG 24 hr tablet Take 2 tablets (120 mg total) by mouth daily. 60 tablet 2   lidocaine (LIDODERM) 5 % Place 1 patch onto the skin daily as needed (For chestwall pain). Remove & Discard patch within 12 hours or as directed by MD 30 patch 0   nitroGLYCERIN (NITROSTAT) 0.4 MG SL tablet Place 1 tablet (0.4 mg total) under the tongue every 5 (five) minutes as needed for chest pain. 25 tablet 0   pantoprazole (PROTONIX) 40 MG tablet Take 1 tablet (40 mg total) by mouth daily as needed (for heartburn). 30 tablet 2   traZODone (DESYREL) 50 MG tablet Take 1 tablet (50 mg total) by mouth at bedtime as needed for sleep. 30 tablet 3    Musculoskeletal: Strength & Muscle Tone: within normal limits Gait & Station: normal Patient leans: N/A          Psychiatric Specialty  Exam:  Presentation  General Appearance: Appropriate for Environment  Eye Contact:Good  Speech:Clear and Coherent; Normal Rate  Speech Volume:Normal  Handedness:Right   Mood and Affect  Mood:Depressed  Affect:Congruent; Appropriate   Thought Process  Thought Processes:Coherent; Goal Directed  Descriptions of Associations:Intact  Orientation:Full (Time, Place and Person)  Thought Content:Logical  History of Schizophrenia/Schizoaffective disorder:No  Duration  of Psychotic Symptoms:N/A  Hallucinations:Hallucinations: None  Ideas of Reference:None  Suicidal Thoughts:Suicidal Thoughts: No (Denies at this time; but passive at times related ot situation and drug use)  Homicidal Thoughts:Homicidal Thoughts: No   Sensorium  Memory:Immediate Good; Recent Good  Judgment:Intact  Insight:Fair; Present   Executive Functions  Concentration:Good  Attention Span:Good  [place] of Knowledge:Good  Language:Good   Psychomotor Activity  Psychomotor Activity:Psychomotor Activity: Normal  Assets  Assets:Communication Skills; Desire for Improvement; Financial Resources/Insurance; Resilience   Sleep  Sleep:Sleep: Good   Physical Exam: Physical Exam Vitals and nursing note reviewed. Exam conducted with a chaperone present.  Constitutional:      General: He is not in acute distress.    Appearance: Normal appearance. He is not ill-appearing.  Pulmonary:     Effort: Pulmonary effort is normal.  Neurological:     Mental Status: He is alert and oriented to person, place, and time.  Psychiatric:        Attention and Perception: Attention and perception normal. He does not perceive auditory or visual hallucinations.        Mood and Affect: Mood and affect normal.        Speech: Speech normal.        Behavior: Behavior normal. Behavior is cooperative.        Thought Content: Thought content normal. Thought content is not paranoid or delusional. Thought content does not include homicidal or suicidal ideation.        Cognition and Memory: Cognition normal.        Judgment: Judgment is impulsive.   Review of Systems  Constitutional: Negative.   HENT: Negative.    Eyes: Negative.   Respiratory: Negative.    Cardiovascular: Negative.  Chest pain: Denies at this time.  Gastrointestinal: Negative.   Genitourinary: Negative.   Musculoskeletal:  Positive for myalgias.  Skin: Negative.   Neurological: Negative.    Endo/Heme/Allergies: Negative.   Psychiatric/Behavioral:  Positive for depression and hallucinations. Substance abuse: Cocaine. Suicidal ideas: Passive no intent or plan.  Blood pressure 136/74, pulse 87, temperature 98.5 F (36.9 C), temperature source Oral, resp. rate 16, SpO2 100 %. There is no height or weight on file to calculate BMI.  Treatment Plan Summary: Plan Psychiatrically clear to follow up with resources given.    Social work/TOC consult ordered:  Patient stating interested in assisted living or family care home.  He is aware that it can't be done from the ED.  He also states he needs someone to take over his check that would help not spending all money on cocaine.  Can you do an APS referral so that DSS can look into guardianship or if they can help patient  Disposition: No evidence of imminent risk to self or others at present.   Patient does not meet criteria for psychiatric inpatient admission. Supportive therapy provided about ongoing stressors. Refer to IOP. Discussed crisis plan, support from social network, calling 911, coming to the Emergency Department, and calling Suicide Hotline.    Discharge Instructions      For your behavioral health needs you are advised to follow up  with the [facility].  They provide outpatient psychiatry, therapy and substance use disorder treatment.  Contact them at your earliest opportunity to schedule an intake appointment:       [facility]      [place], [place] [address]      [phone]   [facility] provides supportive services for the homeless.  Contact them at your convenience:       [facility]      [place], [place] [address]      [phone]    Department of Social Services: Adult Placement Services Adult Placement Services help aging, or disabled adults find appropriate living and health care arrangements. These services are  available to individuals whose health, safety and wellbeing can no longer be maintained at home. Placement arrangements are made in adult care homes, nursing homes, other substitute homes, residential health care settings or institutions. Aging and disabled adults receive help to complete medical evaluations and financial applications and to locate and move to new settings. They also may receive counseling to help them adjust to change. Adult placement services also help aging and disabled adults in the following situations: Individuals unable to maintain themselves in their own homes independently or with available community or family supports. Individuals living in substitute homes, residential health care facilities or institutions and needing assistance in relocating due to changes in level of care needed. Those who need assistance in returning to more independent living arrangements. Those who need assistance in adjusting to or maintaining their placements due to individual or family problems or a lack of resources. Adult Protective Services Adults with disabilities may be vulnerable to abuse, neglect and exploitation. [employer] of social services receive and evaluate reports to determine whether disabled adults are in need of protective services. County agencies protect adults by: Receiving reports and evaluating the need for protective services Planning with the disabled adult, family or caregiver to identify and prevent abuse, neglect or exploitation Reporting evidence of mistreatment to the [employer] and various regulatory agencies Initiating court action as necessary to protect the adult Mobilizing essential services on behalf of the disabled adult Disabled adults reported to be abused, neglected or exploited and in need of protective services are eligible to receive this service regardless of income.   DSS steps to facility admission.  See DSS Website for full details:    Jump ahead to these sections: Step 1: Why Do You Want [facility]? Step 2: Provide Necessary Paperwork Step 3: Complete Application(s) Step 4: Start Medicaid Application if Needed Step 5: Meet with Production manager to Complete Admission Agreement  Step 6: Moving Day      Follow-up [facility] Follow up.   Why: Make and appointment for intake for a primary physician.  Assist with [facility] for assisted living facility or [facility] information: [address]  [place] [address] [phone]                 This service was provided via telemedicine using a 2-way, interactive audio and video technology.  Names of all persons participating in this telemedicine service and their role in this encounter. Name: Earleen Newport Role: NP  Name: [name] Role: Patient  Name:  Role:   Name:  Role:    Secure message sent to  patient's nurse Velna Ochs, RN informing: Psychiatric consult completed and patient psychiatrically cleared to follow-up with resources given.  I also ordered social work consult for resources to assist patient with family care home/assisted living placement.  Please inform MD only default listed.  Ayelet Gruenewald, NP 11/12/2021 12:29 PM

## 2021-11-12 NOTE — ED Notes (Signed)
Patient given discharge instructions, all questions answered. Patient in possession of all belongings, directed to the discharge area  

## 2021-11-12 NOTE — Progress Notes (Signed)
CSW added instructions to patients AVS to get assistance at [employer] [facility] [employer] and Social security with payee assistance and medicaid.

## 2021-11-12 NOTE — ED Notes (Signed)
Pt is currently resting on bed. Ate breakfast and cooperative with taking his meds. Pt is pleasant, alert and oriented x 4. Continues to report SI with no plan. Pt was asked what are his triggers, pt states, "Just life in general." Reports history of SI in the past and attempted to kill himself in the past. Safety precautions maintained. Will continue to monitor.

## 2021-11-12 NOTE — ED Notes (Signed)
Patient moved into 36 to TTS

## 2021-11-12 NOTE — ED Provider Notes (Signed)
Emergency Medicine Observation Re-evaluation Note  Davonn Flanery is a 71 y.o. male, seen on rounds today.  Pt initially presented to the ED for complaints of Chest Pain and Suicidal Currently, the patient is sleeping.  Physical Exam  BP 136/74 (BP Location: Left Arm)    Pulse 87    Temp 98.5 F (36.9 C) (Oral)    Resp 16    SpO2 100%  Physical Exam General: sleeping Cardiac: deferred Lungs: deferred Psych: deferred  ED Course / MDM  EKG:   I have reviewed the labs performed to date as well as medications administered while in observation.  Recent changes in the last 24 hours include home meds being ordered.  Plan  Current plan is for psychiatric re-evaluation this morning.  [name] is not under involuntary commitment.     Sherwood Gambler, MD 11/12/21 [phone]

## 2021-11-13 ENCOUNTER — Other Ambulatory Visit (HOSPITAL_COMMUNITY): Payer: Self-pay | Admitting: Nurse Practitioner

## 2021-11-13 DIAGNOSIS — R899 Unspecified abnormal finding in specimens from other organs, systems and tissues: Secondary | ICD-10-CM

## 2021-11-17 ENCOUNTER — Other Ambulatory Visit: Payer: Self-pay

## 2021-11-17 ENCOUNTER — Emergency Department (HOSPITAL_COMMUNITY)
Admission: EM | Admit: 2021-11-17 | Discharge: 2021-11-18 | Disposition: A | Discharge status: Home / Self Care | Payer: Medicare Other | Attending: Emergency Medicine | Admitting: Emergency Medicine

## 2021-11-17 ENCOUNTER — Emergency Department (HOSPITAL_COMMUNITY): Payer: Medicare Other

## 2021-11-17 ENCOUNTER — Encounter (HOSPITAL_COMMUNITY): Payer: Self-pay | Admitting: *Deleted

## 2021-11-17 DIAGNOSIS — Y900 Blood alcohol level of less than 20 mg/100 ml: Secondary | ICD-10-CM | POA: Diagnosis not present

## 2021-11-17 DIAGNOSIS — R079 Chest pain, unspecified: Secondary | ICD-10-CM | POA: Insufficient documentation

## 2021-11-17 DIAGNOSIS — Z7982 Long term (current) use of aspirin: Secondary | ICD-10-CM | POA: Insufficient documentation

## 2021-11-17 DIAGNOSIS — Z7984 Long term (current) use of oral hypoglycemic drugs: Secondary | ICD-10-CM | POA: Diagnosis not present

## 2021-11-17 DIAGNOSIS — E1140 Type 2 diabetes mellitus with diabetic neuropathy, unspecified: Secondary | ICD-10-CM | POA: Insufficient documentation

## 2021-11-17 DIAGNOSIS — E1165 Type 2 diabetes mellitus with hyperglycemia: Secondary | ICD-10-CM | POA: Diagnosis not present

## 2021-11-17 DIAGNOSIS — I10 Essential (primary) hypertension: Secondary | ICD-10-CM | POA: Diagnosis not present

## 2021-11-17 DIAGNOSIS — R45851 Suicidal ideations: Secondary | ICD-10-CM | POA: Diagnosis not present

## 2021-11-17 DIAGNOSIS — F1414 Cocaine abuse with cocaine-induced mood disorder: Secondary | ICD-10-CM | POA: Diagnosis not present

## 2021-11-17 DIAGNOSIS — Z7951 Long term (current) use of inhaled steroids: Secondary | ICD-10-CM | POA: Diagnosis not present

## 2021-11-17 DIAGNOSIS — R0602 Shortness of breath: Secondary | ICD-10-CM | POA: Diagnosis not present

## 2021-11-17 DIAGNOSIS — J45909 Unspecified asthma, uncomplicated: Secondary | ICD-10-CM | POA: Insufficient documentation

## 2021-11-17 DIAGNOSIS — F1494 Cocaine use, unspecified with cocaine-induced mood disorder: Secondary | ICD-10-CM | POA: Diagnosis present

## 2021-11-17 DIAGNOSIS — Z20822 Contact with and (suspected) exposure to covid-19: Secondary | ICD-10-CM | POA: Diagnosis not present

## 2021-11-17 DIAGNOSIS — F1410 Cocaine abuse, uncomplicated: Secondary | ICD-10-CM | POA: Diagnosis present

## 2021-11-17 DIAGNOSIS — Z794 Long term (current) use of insulin: Secondary | ICD-10-CM | POA: Diagnosis not present

## 2021-11-17 DIAGNOSIS — Z79899 Other long term (current) drug therapy: Secondary | ICD-10-CM | POA: Insufficient documentation

## 2021-11-17 DIAGNOSIS — F332 Major depressive disorder, recurrent severe without psychotic features: Secondary | ICD-10-CM | POA: Insufficient documentation

## 2021-11-17 DIAGNOSIS — N289 Disorder of kidney and ureter, unspecified: Secondary | ICD-10-CM | POA: Insufficient documentation

## 2021-11-17 DIAGNOSIS — R059 Cough, unspecified: Secondary | ICD-10-CM | POA: Insufficient documentation

## 2021-11-17 LAB — RESP PANEL BY RT-PCR (FLU A&B, COVID) ARPGX2
Influenza A by PCR: NEGATIVE
Influenza B by PCR: NEGATIVE
SARS Coronavirus 2 by RT PCR: NEGATIVE

## 2021-11-17 LAB — CBC WITH DIFFERENTIAL/PLATELET
Abs Immature Granulocytes: 0.02 10*3/uL (ref 0.00–0.07)
Basophils Absolute: 0 10*3/uL (ref 0.0–0.1)
Basophils Relative: 1 %
Eosinophils Absolute: 0.2 10*3/uL (ref 0.0–0.5)
Eosinophils Relative: 5 %
HCT: 31 % — ABNORMAL LOW (ref 39.0–52.0)
Hemoglobin: 9.8 g/dL — ABNORMAL LOW (ref 13.0–17.0)
Immature Granulocytes: 1 %
Lymphocytes Relative: 20 %
Lymphs Abs: 0.8 10*3/uL (ref 0.7–4.0)
MCH: 27.8 pg (ref 26.0–34.0)
MCHC: 31.6 g/dL (ref 30.0–36.0)
MCV: 87.8 fL (ref 80.0–100.0)
Monocytes Absolute: 0.4 10*3/uL (ref 0.1–1.0)
Monocytes Relative: 9 %
Neutro Abs: 2.8 10*3/uL (ref 1.7–7.7)
Neutrophils Relative %: 64 %
Platelets: 141 10*3/uL — ABNORMAL LOW (ref 150–400)
RBC: 3.53 MIL/uL — ABNORMAL LOW (ref 4.22–5.81)
RDW: 13 % (ref 11.5–15.5)
WBC: 4.3 10*3/uL (ref 4.0–10.5)
nRBC: 0 % (ref 0.0–0.2)

## 2021-11-17 LAB — BASIC METABOLIC PANEL
Anion gap: 8 (ref 5–15)
BUN: 42 mg/dL — ABNORMAL HIGH (ref 8–23)
CO2: 23 mmol/L (ref 22–32)
Calcium: 8.3 mg/dL — ABNORMAL LOW (ref 8.9–10.3)
Chloride: 107 mmol/L (ref 98–111)
Creatinine, Ser: 2.45 mg/dL — ABNORMAL HIGH (ref 0.61–1.24)
GFR, Estimated: 27 mL/min — ABNORMAL LOW (ref >60)
Glucose, Bld: 311 mg/dL — ABNORMAL HIGH (ref 70–99)
Potassium: 3.8 mmol/L (ref 3.5–5.1)
Sodium: 138 mmol/L (ref 135–145)

## 2021-11-17 LAB — ACETAMINOPHEN LEVEL: Acetaminophen (Tylenol), Serum: <10 ug/mL — ABNORMAL LOW (ref 10–30)

## 2021-11-17 LAB — TROPONIN I (HIGH SENSITIVITY)
Troponin I (High Sensitivity): 15 ng/L (ref <18)
Troponin I (High Sensitivity): 17 ng/L (ref <18)

## 2021-11-17 LAB — ETHANOL: Alcohol, Ethyl (B): <10 mg/dL (ref <10)

## 2021-11-17 LAB — SALICYLATE LEVEL: Salicylate Lvl: <7 mg/dL — ABNORMAL LOW (ref 7.0–30.0)

## 2021-11-17 MED ORDER — INSULIN GLARGINE-YFGN 100 UNIT/ML ~~LOC~~ SOLN
30.0000 [IU] | Freq: Every day | SUBCUTANEOUS | Status: DC
  Administered 2021-11-18: 30 [IU] via SUBCUTANEOUS
  Filled 2021-11-17: qty 0.3

## 2021-11-17 MED ORDER — BUPROPION HCL ER (XL) 150 MG PO TB24
150.0000 mg | ORAL_TABLET | Freq: Every morning | ORAL | Status: DC
  Administered 2021-11-18: 150 mg via ORAL
  Filled 2021-11-17: qty 1

## 2021-11-17 MED ORDER — BICTEGRAVIR-EMTRICITAB-TENOFOV 50-200-25 MG PO TABS
1.0000 | ORAL_TABLET | Freq: Every day | ORAL | Status: DC
  Administered 2021-11-18: 1 via ORAL
  Filled 2021-11-17: qty 1

## 2021-11-17 MED ORDER — TRAZODONE HCL 100 MG PO TABS: 50.0000 mg | ORAL_TABLET | Freq: Every evening | ORAL | Status: DC | PRN

## 2021-11-17 MED ORDER — INSULIN ASPART 100 UNIT/ML IJ SOLN
4.0000 [IU] | Freq: Three times a day (TID) | INTRAMUSCULAR | Status: DC
  Administered 2021-11-18: 4 [IU] via SUBCUTANEOUS
  Filled 2021-11-17: qty 0.04

## 2021-11-17 MED ORDER — AMLODIPINE BESYLATE 5 MG PO TABS
10.0000 mg | ORAL_TABLET | Freq: Every day | ORAL | Status: DC
  Administered 2021-11-18: 10 mg via ORAL
  Filled 2021-11-17 (×2): qty 2

## 2021-11-17 MED ORDER — ASPIRIN EC 81 MG PO TBEC
81.0000 mg | DELAYED_RELEASE_TABLET | Freq: Every day | ORAL | Status: DC
  Administered 2021-11-18: 81 mg via ORAL
  Filled 2021-11-17 (×2): qty 1

## 2021-11-17 MED ORDER — HYDRALAZINE HCL 25 MG PO TABS
75.0000 mg | ORAL_TABLET | Freq: Three times a day (TID) | ORAL | Status: DC
  Administered 2021-11-17 – 2021-11-18 (×2): 75 mg via ORAL
  Filled 2021-11-17 (×2): qty 3

## 2021-11-17 NOTE — ED Triage Notes (Signed)
Pt states cough, congestion, shortness of breath and suicidal without specific plan. Not taking medications as prescribed.

## 2021-11-17 NOTE — ED Triage Notes (Signed)
[place], via [place]. Per medic report, SI that started 2 hours ago. Congestion for 3 days, denies exposure. CP for over a year, SOB. A/O. 184/94, hr 94, SP02 100% RA. CBG 388, just ate about an hour ago, known DM

## 2021-11-17 NOTE — ED Provider Notes (Signed)
[facility] DEPT Provider Note   CSN: [id number] Arrival date & time: 11/17/21  1945     History  No chief complaint on file.   Michael [name] is a 71 y.o. male.  Patient presents to ER chief complaint of chest pain off and on for a year.  Also states is intermittent shortness of breath and cough.  Also complaining of thoughts of wanting to end his life.  Has no concrete plan but feeling depressed.      Home Medications Prior to Admission medications   Medication Sig Start Date End Date Taking? Authorizing Provider  albuterol (VENTOLIN HFA) 108 (90 Base) MCG/ACT inhaler Inhale 1-2 puffs into the lungs every 6 (six) hours as needed for wheezing or shortness of breath. 10/24/21   [name], MD  amLODipine (NORVASC) 10 MG tablet Take 1 tablet (10 mg total) by mouth daily. 10/24/21   [name], MD  aspirin EC 81 MG tablet Take 81 mg by mouth daily. Swallow whole.    [provider]  bictegravir-emtricitabine-tenofovir AF (BIKTARVY) 50-200-25 MG TABS tablet Take 1 tablet by mouth daily. 10/24/21   [name], MD  budesonide-formoterol [facility]) 160-4.5 MCG/ACT inhaler Inhale 2 puffs into the lungs in the morning and at bedtime. 10/24/21   [name], MD  buPROPion (WELLBUTRIN XL) 150 MG 24 hr tablet Take 1 tablet (150 mg total) by mouth every morning. 10/24/21   [name], MD  hydrALAZINE (APRESOLINE) 25 MG tablet Take 3 tablets (75 mg total) by mouth 3 (three) times daily. 10/24/21 12/23/21  [name], MD  insulin glargine (LANTUS SOLOSTAR) 100 UNIT/ML Solostar Pen Inject 30 Units into the skin daily. 10/24/21   [name], MD  insulin lispro (HUMALOG KWIKPEN) 100 UNIT/ML KwikPen Inject 4 Units into the skin 3 (three) times daily with meals. 10/24/21   [name], MD  Insulin Pen Needle 31G X 5 MM MISC Use with insulin pens Patient taking differently: 1 each by Other route See admin  instructions. Use with insulin pens 10/24/21   [name], MD  isosorbide mononitrate (IMDUR) 60 MG 24 hr tablet Take 2 tablets (120 mg total) by mouth daily. 10/24/21   [name], MD  lidocaine (LIDODERM) 5 % Place 1 patch onto the skin daily as needed (For chestwall pain). Remove & Discard patch within 12 hours or as directed by MD 10/24/21   [name], MD  nitroGLYCERIN (NITROSTAT) 0.4 MG SL tablet Place 1 tablet (0.4 mg total) under the tongue every 5 (five) minutes as needed for chest pain. 10/24/21   [name], MD  pantoprazole (PROTONIX) 40 MG tablet Take 1 tablet (40 mg total) by mouth daily as needed (for heartburn). 10/24/21   [name], MD  traZODone (DESYREL) 50 MG tablet Take 1 tablet (50 mg total) by mouth at bedtime as needed for sleep. 10/24/21   [name], MD      Allergies    Patient has no known allergies.    Review of Systems   Review of Systems  Constitutional:  Negative for fever.  HENT:  Negative for ear pain and sore throat.   Eyes:  Negative for pain.  Respiratory:  Positive for cough and shortness of breath.   Cardiovascular:  Positive for chest pain.  Gastrointestinal:  Negative for abdominal pain.  Genitourinary:  Negative for flank pain.  Musculoskeletal:  Negative for back pain.  Skin:  Negative for color change and rash.  Neurological:  Negative for syncope.  All other systems reviewed and are negative.  Physical Exam Updated Vital Signs BP (!) 162/84    Pulse 84    Temp 98.9 F (37.2 C)    Resp 18    SpO2 100%  Physical Exam Constitutional:      Appearance: He is well-developed.  HENT:     Head: Normocephalic.     Nose: Nose normal.  Eyes:     Extraocular Movements: Extraocular movements intact.  Cardiovascular:     Rate and Rhythm: Normal rate.  Pulmonary:     Effort: Pulmonary effort is normal.  Skin:    Coloration: Skin is not jaundiced.  Neurological:     Mental Status: He is alert. Mental status  is at baseline.    ED Results / Procedures / Treatments   Labs (all labs ordered are listed, but only abnormal results are displayed) Labs Reviewed  CBC WITH DIFFERENTIAL/PLATELET - Abnormal; Notable for the following components:      Result Value   RBC 3.53 (*)    Hemoglobin 9.8 (*)    HCT 31.0 (*)    Platelets 141 (*)    All other components within normal limits  BASIC METABOLIC PANEL - Abnormal; Notable for the following components:   Glucose, Bld 311 (*)    BUN 42 (*)    Creatinine, Ser 2.45 (*)    Calcium 8.3 (*)    GFR, Estimated 27 (*)    All other components within normal limits  SALICYLATE LEVEL - Abnormal; Notable for the following components:   Salicylate Lvl <8.5 (*)    All other components within normal limits  ACETAMINOPHEN LEVEL - Abnormal; Notable for the following components:   Acetaminophen (Tylenol), Serum <10 (*)    All other components within normal limits  RESP PANEL BY RT-PCR (FLU A&B, COVID) ARPGX2  ETHANOL  TROPONIN I (HIGH SENSITIVITY)  TROPONIN I (HIGH SENSITIVITY)    EKG EKG Interpretation  Date/Time:  Saturday November 17 2021 20:16:09 EST Ventricular Rate:  86 PR Interval:  138 QRS Duration: 83 QT Interval:  363 QTC Calculation: 435 R Axis:   77 Text Interpretation: Sinus rhythm Consider left ventricular hypertrophy Confirmed by Thamas Jaegers ([id number]) on 11/17/2021 8:47:43 PM  Radiology DG Chest 2 View  Result Date: 11/17/2021 CLINICAL DATA:  Shortness of breath and chest pain. EXAM: CHEST - 2 VIEW COMPARISON:  Chest radiograph dated 11/11/2021. FINDINGS: No focal consolidation, pleural effusion, or pneumothorax. The cardiac silhouette is within limits. Median sternotomy wires and coronary vascular stent. Left atrial appendage occlusive device. Atherosclerotic calcification of the aorta. No acute osseous pathology faint IMPRESSION: No active cardiopulmonary disease. Electronically Signed   By: Anner Crete M.D.   On: 11/17/2021 20:29     Procedures Procedures    Medications Ordered in ED Medications  amLODipine (NORVASC) tablet 10 mg (has no administration in time range)  aspirin EC tablet 81 mg (has no administration in time range)  bictegravir-emtricitabine-tenofovir AF (BIKTARVY) 50-200-25 MG per tablet 1 tablet (has no administration in time range)  buPROPion (WELLBUTRIN XL) 24 hr tablet 150 mg (has no administration in time range)  hydrALAZINE (APRESOLINE) tablet 75 mg (has no administration in time range)  insulin glargine (LANTUS) Solostar Pen 30 Units (has no administration in time range)  insulin lispro (HUMALOG) KwikPen 4 Units (has no administration in time range)  traZODone (DESYREL) tablet 50 mg (has no administration in time range)  ED Course/ Medical Decision Making/ A&P                           Medical Decision Making  Chart review shows multiple ER visits no recent primary care visit noted.  Labs show negative troponin, consistent renal insufficiency noted.  Patient medically cleared pending TTS evaluation for suicidal thoughts.        Final Clinical Impression(s) / ED Diagnoses Final diagnoses:  Chest pain, unspecified type  Suicidal thoughts    Rx / DC Orders ED Discharge Orders     None         Luna Fuse, MD 11/17/21 2238

## 2021-11-17 NOTE — BH Assessment (Signed)
Clinician message Tracie Harrier, RN and Tawni Millers. Ishmael Holter, RN: "Hey. It's Trey with TTS. Is the pt able to engage in the assessment, if so the pt will need to be placed in a private room. Also is the pt under IVC?"   Clinician waiting response.    Vertell Novak, [place], [facility], [facility] Triage Specialist [phone]

## 2021-11-17 NOTE — ED Provider Triage Note (Signed)
Emergency Medicine Provider Triage Evaluation Note  Michael [name] , a 71 y.o. male  was evaluated in triage.  Pt complains of chest pain for one year since triple bypass. Maybe slightly worse today. Denies worse with exertion. Reports SI without plan. Also endorses some cough, sore throat, feeling of shortness of breath. No nausea, vomiting.  Review of Systems  Positive: Chest pain, SI, shob Negative: Vomiting, abdominal pain, fever, chills  Physical Exam  There were no vitals taken for this visit. Gen:   Awake, no distress   Resp:  Normal effort  MSK:   Moves extremities without difficulty  Other:  No ttp abdomen  Medical Decision Making  Medically screening exam initiated at 7:57 PM.  Appropriate orders placed.  Michael [name] was informed that the remainder of the evaluation will be completed by another provider, this initial triage assessment does not replace that evaluation, and the importance of remaining in the ED until their evaluation is complete.  Workup initiated   Anselmo Pickler, [place] 11/17/21 1959

## 2021-11-17 NOTE — ED Notes (Signed)
Pt is dressed out completely. Pt belongings are right near tech station.

## 2021-11-18 DIAGNOSIS — R45851 Suicidal ideations: Secondary | ICD-10-CM

## 2021-11-18 LAB — CBG MONITORING, ED
Glucose-Capillary: 170 mg/dL — ABNORMAL HIGH (ref 70–99)
Glucose-Capillary: 215 mg/dL — ABNORMAL HIGH (ref 70–99)

## 2021-11-18 LAB — RAPID URINE DRUG SCREEN, HOSP PERFORMED
Amphetamines: NOT DETECTED
Barbiturates: NOT DETECTED
Benzodiazepines: NOT DETECTED
Cocaine: POSITIVE — AB
Opiates: NOT DETECTED
Tetrahydrocannabinol: NOT DETECTED

## 2021-11-18 MED ORDER — OXYMETAZOLINE HCL 0.05 % NA SOLN
1.0000 | Freq: Once | NASAL | Status: AC
  Administered 2021-11-18: 1 via NASAL
  Filled 2021-11-18: qty 30

## 2021-11-18 NOTE — Consult Note (Signed)
Telepsych Consultation   Reason for Consult:  psych consult Referring Physician:  Thamas Jaegers, MD Location of Patient: [facility] Location of Provider: [place] [facility]  Patient Identification: Michael [name] MRN:  [id number] Principal Diagnosis: Cocaine-induced mood disorder ([place]) Diagnosis:  Principal Problem:   Cocaine-induced mood disorder ([place]) Active Problems:   Cocaine use disorder ([place])   MDD (major depressive disorder), recurrent severe, without psychosis ([place])  Total Time spent with patient: 15 minutes  Subjective:   Michael [name] is a 71 y.o. male patient admitted with chest pain then voices suicidal ideations.   "Stress, suicidal. Not breathing. Pain in my chest. I have a real bad cold I guess. I've had it for 3 days, I haven't taken anything for it".  He presents sitting in chair; alert and oriented. Patient endorses URI symptoms and chronic suicidal ideations with no intent or plan. Denies any auditory or visual hallucinations, appears calm and cooperative with no apparent signs or symptoms of psychosis at this time. Provider discussed at length decision-making, substance use and effects on mental/physical health; patient verbalized an understanding yet still appears to be in the pre-contemplation stage of change as he continues to minimize his use and effects on his life. States he is currently living at the [facility] and plans on returning there upon discharge.     HPI:  Michael [name] is a 71 year old male known to [facility] with past psychiatric history of MDD, polysubstance abuse, cocaine use disorder, Cocaine induced mood disorder who presented to [facility] voluntarily via EMS from [place] where he reported SI. UDS+ cocaine, BAL<10.   Past Psychiatric History: MDD, polysubstance abuse, cocaine use disorder, Cocaine induced mood disorder  Risk to Self:   Risk to Others:   Prior Inpatient Therapy:   Prior Outpatient Therapy:    Past  Medical History:  Past Medical History:  Diagnosis Date   A-fib ([place])    Asthma    No PFTs, history of childhood asthma   CAD (coronary artery disease)    a. 06/2013 STEMI/PCI ([facility]): LAD w/ thrombus (treated with BMS), mid 75%, D2 75%; LCX OM2 75%; RCA small, PDA 95%, PLV 95%;  b. 10/2013 Cath/PCI: ISR w/in LAD (Promus DES x 2), borderline OM2 lesion;  c. 01/2014 MV: Intermediate risk, medium-sized distal ant wall infarct w/ very small amt of peri-infarct ischemia. EF 60%.   Cellulitis 04/2014   left facial   Cellulitis and abscess of toe of right foot 12/08/2019   Chondromalacia of medial femoral condyle    Left knee MRI 04/28/12: Chondromalacia of the medial femoral condyle with slight peripheral degeneration of the meniscocapsular junction of the medial meniscus; followed by sports medicine   Collagen vascular disease ([place])    Crack cocaine use    for 20+ years, has been enrolled in detox programs in the past   Depression    with history of hospitalization for suicidal ideation   Diabetes mellitus 2002   Diagnosed in 2002, started insulin in 2012   Gout    Gout 04/28/2012   Headache(784.0)    CT head 08/2011: Periventricular and subcortical white matter hypodensities are most in keeping with chronic microangiopathic change   HIV infection [facility]) Nov 2012   Followed by [name]   Hyperlipidemia    Hypertension    Pulmonary embolism [facility])     Past Surgical History:  Procedure Laterality Date   AMPUTATION Right 07/21/2019   Procedure: RIGHT SECOND TOE AMPUTATION;  Surgeon: [name], MD;  Location: [place];  Service: Orthopedics;  Laterality: Right;   BACK SURGERY     1988   BOWEL RESECTION     CARDIAC SURGERY     CERVICAL SPINE SURGERY     " rods in my neck "   CORONARY ARTERY BYPASS GRAFT     CORONARY STENT PLACEMENT     NM MYOCAR PERF WALL MOTION  12/27/2011   normal   SPINE SURGERY     Family History:  Family History  Problem Relation Age of Onset   Diabetes Mother     Hypertension Mother    Hyperlipidemia Mother    Diabetes Father    Cancer Father    Hypertension Father    Diabetes Brother    Heart disease Brother    Diabetes Sister    Colon cancer Neg Hx    Family Psychiatric  History: not noted Social History:  Social History   Substance and Sexual Activity  Alcohol Use No   Alcohol/week: 4.0 standard drinks   Types: 2 Cans of beer, 2 Shots of liquor per week     Social History   Substance and Sexual Activity  Drug Use Not Currently   Frequency: 4.0 times per week   Types: "Crack" cocaine, Cocaine   Comment: Recent use of crack    Social History   Socioeconomic History   Marital status: Widowed    Spouse name: Not on file   Number of children: 2   Years of education: 47   Highest education level: 12th grade  Occupational History    Employer: UNEMPLOYED    Comment: 04/2016  Tobacco Use   Smoking status: Never   Smokeless tobacco: Never  Vaping Use   Vaping Use: Never used  Substance and Sexual Activity   Alcohol use: No    Alcohol/week: 4.0 standard drinks    Types: 2 Cans of beer, 2 Shots of liquor per week   Drug use: Not Currently    Frequency: 4.0 times per week    Types: "Crack" cocaine, Cocaine    Comment: Recent use of crack   Sexual activity: Yes    Comment: DECLINED CONDOMS  Other Topics Concern   Not on file  Social History Narrative   Currently staying with a friend in [place].  Was staying @ local motel until a few days ago - left b/c of bed bugs.   [facility]  Not followed by a psychiatrist.   Social Determinants of Health   Financial Resource Strain: Not on file  Food Insecurity: Not on file  Transportation Needs: Not on file  Physical Activity: Not on file  Stress: Not on file  Social Connections: Not on file   Additional Social History:    Allergies:  No Known Allergies  Labs:  Results for orders placed or performed during the hospital encounter of 11/17/21 (from the past  48 hour(s))  CBC with Differential     Status: Abnormal   Collection Time: 11/17/21  8:00 PM  Result Value Ref Range   WBC 4.3 4.0 - 10.5 K/uL   RBC 3.53 (L) 4.22 - 5.81 MIL/uL   Hemoglobin 9.8 (L) 13.0 - 17.0 g/dL   HCT 31.0 (L) 39.0 - 52.0 %   MCV 87.8 80.0 - 100.0 fL   MCH 27.8 26.0 - 34.0 pg   MCHC 31.6 30.0 - 36.0 g/dL   RDW 13.0 11.5 - 15.5 %   Platelets 141 (  L) 150 - 400 K/uL   nRBC 0.0 0.0 - 0.2 %   Neutrophils Relative % 64 %   Neutro Abs 2.8 1.7 - 7.7 K/uL   Lymphocytes Relative 20 %   Lymphs Abs 0.8 0.7 - 4.0 K/uL   Monocytes Relative 9 %   Monocytes Absolute 0.4 0.1 - 1.0 K/uL   Eosinophils Relative 5 %   Eosinophils Absolute 0.2 0.0 - 0.5 K/uL   Basophils Relative 1 %   Basophils Absolute 0.0 0.0 - 0.1 K/uL   Immature Granulocytes 1 %   Abs Immature Granulocytes 0.02 0.00 - 0.07 K/uL    Comment: Performed at [facility], [place] [address] [place], [place] [address]  Basic metabolic panel     Status: Abnormal   Collection Time: 11/17/21  8:00 PM  Result Value Ref Range   Sodium 138 135 - 145 mmol/L   Potassium 3.8 3.5 - 5.1 mmol/L   Chloride 107 98 - 111 mmol/L   CO2 23 22 - 32 mmol/L   Glucose, Bld 311 (H) 70 - 99 mg/dL    Comment: Glucose reference range applies only to samples taken after fasting for at least 8 hours.   BUN 42 (H) 8 - 23 mg/dL   Creatinine, Ser 2.45 (H) 0.61 - 1.24 mg/dL   Calcium 8.3 (L) 8.9 - 10.3 mg/dL   GFR, Estimated 27 (L) >60 mL/min    Comment: (NOTE) Calculated using the CKD-EPI Creatinine Equation (2021)    Anion gap 8 5 - 15    Comment: Performed at [facility], [place] [address], [place], [place] [address]  Troponin I (High Sensitivity)     Status: None   Collection Time: 11/17/21  8:00 PM  Result Value Ref Range   Troponin I (High Sensitivity) 15 <18 ng/L    Comment: (NOTE) Elevated high sensitivity troponin I (hsTnI) values and significant  changes across serial measurements may suggest  ACS but many other  chronic and acute conditions are known to elevate hsTnI results.  Refer to the "Links" section for chest pain algorithms and additional  guidance. Performed at [facility], [place] [address] [place], [place] [address]   Ethanol     Status: None   Collection Time: 11/17/21  8:01 PM  Result Value Ref Range   Alcohol, Ethyl (B) <10 <10 mg/dL    Comment: (NOTE) Lowest detectable limit for serum alcohol is 10 mg/dL.  For medical purposes only. Performed at [facility], [place] [address], [place], [place] [address]   Salicylate level     Status: Abnormal   Collection Time: 11/17/21  8:01 PM  Result Value Ref Range   Salicylate Lvl <2.5 (L) 7.0 - 30.0 mg/dL    Comment: Performed at [facility], [place] [address] [place], [place] [address]  Acetaminophen level     Status: Abnormal   Collection Time: 11/17/21  8:01 PM  Result Value Ref Range   Acetaminophen (Tylenol), Serum <10 (L) 10 - 30 ug/mL    Comment: (NOTE) Therapeutic concentrations vary significantly. A range of 10-30 ug/mL  may be an effective concentration for many patients. However, some  are best treated at concentrations outside of this range. Acetaminophen concentrations >150 ug/mL at 4 hours after ingestion  and >50 ug/mL at 12 hours after ingestion are often associated with  toxic reactions.  Performed at [facility], [place] [address], [place], [place] [address]   Resp Panel  by RT-PCR (Flu A&B, Covid) Nasopharyngeal Swab     Status: None   Collection Time: 11/17/21  9:27 PM   Specimen: Nasopharyngeal Swab; Nasopharyngeal(NP) swabs in vial transport medium  Result Value Ref Range   SARS Coronavirus 2 by RT PCR NEGATIVE NEGATIVE    Comment: (NOTE) SARS-CoV-2 target nucleic acids are NOT DETECTED.  The SARS-CoV-2 RNA is generally detectable in upper respiratory specimens during the acute phase of infection. The  lowest concentration of SARS-CoV-2 viral copies this assay can detect is 138 copies/mL. A negative result does not preclude SARS-Cov-2 infection and should not be used as the sole basis for treatment or other patient management decisions. A negative result may occur with  improper specimen collection/handling, submission of specimen other than nasopharyngeal swab, presence of viral mutation(s) within the areas targeted by this assay, and inadequate number of viral copies(<138 copies/mL). A negative result must be combined with clinical observations, patient history, and epidemiological information. The expected result is Negative.  Fact Sheet for Patients:  EntrepreneurPulse.com.au  Fact Sheet for Healthcare Providers:  IncredibleEmployment.be  This test is no t yet approved or cleared by the [place] FDA and  has been authorized for detection and/or diagnosis of SARS-CoV-2 by FDA under an Emergency Use Authorization (EUA). This EUA will remain  in effect (meaning this test can be used) for the duration of the COVID-19 declaration under Section 564(b)(1) of the Act, 21 U.S.C.section 360bbb-3(b)(1), unless the authorization is terminated  or revoked sooner.       Influenza A by PCR NEGATIVE NEGATIVE   Influenza B by PCR NEGATIVE NEGATIVE    Comment: (NOTE) The Xpert Xpress SARS-CoV-2/FLU/RSV plus assay is intended as an aid in the diagnosis of influenza from Nasopharyngeal swab specimens and should not be used as a sole basis for treatment. Nasal washings and aspirates are unacceptable for Xpert Xpress SARS-CoV-2/FLU/RSV testing.  Fact Sheet for Patients: EntrepreneurPulse.com.au  Fact Sheet for Healthcare Providers: IncredibleEmployment.be  This test is not yet approved or cleared by the [place] FDA and has been authorized for detection and/or diagnosis of SARS-CoV-2 by FDA under an Emergency  Use Authorization (EUA). This EUA will remain in effect (meaning this test can be used) for the duration of the COVID-19 declaration under Section 564(b)(1) of the Act, 21 U.S.C. section 360bbb-3(b)(1), unless the authorization is terminated or revoked.  Performed at [facility], [place] [address] [place], [place] [address]   Troponin I (High Sensitivity)     Status: None   Collection Time: 11/17/21  9:45 PM  Result Value Ref Range   Troponin I (High Sensitivity) 17 <18 ng/L    Comment: (NOTE) Elevated high sensitivity troponin I (hsTnI) values and significant  changes across serial measurements may suggest ACS but many other  chronic and acute conditions are known to elevate hsTnI results.  Refer to the "Links" section for chest pain algorithms and additional  guidance. Performed at [facility], [place] [address] [place], [place] [address]   Urine rapid drug screen (hosp performed)     Status: Abnormal   Collection Time: 11/18/21  8:27 AM  Result Value Ref Range   Opiates NONE DETECTED NONE DETECTED   Cocaine POSITIVE (A) NONE DETECTED   Benzodiazepines NONE DETECTED NONE DETECTED   Amphetamines NONE DETECTED NONE DETECTED   Tetrahydrocannabinol NONE DETECTED NONE DETECTED   Barbiturates NONE DETECTED NONE DETECTED    Comment: (NOTE) DRUG SCREEN FOR MEDICAL PURPOSES ONLY.  IF CONFIRMATION IS  NEEDED FOR ANY PURPOSE, NOTIFY LAB WITHIN 5 DAYS.  LOWEST DETECTABLE LIMITS FOR URINE DRUG SCREEN Drug Class                     Cutoff (ng/mL) Amphetamine and metabolites    1000 Barbiturate and metabolites    200 Benzodiazepine                 983 Tricyclics and metabolites     300 Opiates and metabolites        300 Cocaine and metabolites        300 THC                            50 Performed at [facility], [place] [address] [place], [place] [address]   POC CBG, ED     Status: Abnormal   Collection Time: 11/18/21  9:28  AM  Result Value Ref Range   Glucose-Capillary 170 (H) 70 - 99 mg/dL    Comment: Glucose reference range applies only to samples taken after fasting for at least 8 hours.    Medications:  Current Facility-Administered Medications  Medication Dose Route Frequency Provider Last Rate Last Admin   amLODipine (NORVASC) tablet 10 mg  10 mg Oral Daily Luna Fuse, MD   10 mg at 11/18/21 5397   aspirin EC tablet 81 mg  81 mg Oral Daily Luna Fuse, MD   81 mg at 11/18/21 0942   bictegravir-emtricitabine-tenofovir AF (BIKTARVY) 50-200-25 MG per tablet 1 tablet  1 tablet Oral Daily Luna Fuse, MD   1 tablet at 11/18/21 6734   buPROPion (WELLBUTRIN XL) 24 hr tablet 150 mg  150 mg Oral q morning Luna Fuse, MD   150 mg at 11/18/21 1937   hydrALAZINE (APRESOLINE) tablet 75 mg  75 mg Oral TID Luna Fuse, MD   75 mg at 11/18/21 0942   insulin aspart (novoLOG) injection 4 Units  4 Units Subcutaneous TID [facility] Luna Fuse, MD   4 Units at 11/18/21 0945   insulin glargine-yfgn [facility]) injection 30 Units  30 Units Subcutaneous Daily Luna Fuse, MD   30 Units at 11/18/21 9024   traZODone (DESYREL) tablet 50 mg  50 mg Oral QHS PRN Luna Fuse, MD       Current Outpatient Medications  Medication Sig Dispense Refill   albuterol (VENTOLIN HFA) 108 (90 Base) MCG/ACT inhaler Inhale 1-2 puffs into the lungs every 6 (six) hours as needed for wheezing or shortness of breath. 8.5 g 1   amLODipine (NORVASC) 10 MG tablet Take 1 tablet (10 mg total) by mouth daily. 60 tablet 2   aspirin EC 81 MG tablet Take 81 mg by mouth daily. Swallow whole.     bictegravir-emtricitabine-tenofovir AF (BIKTARVY) 50-200-25 MG TABS tablet Take 1 tablet by mouth daily. 30 tablet 11   budesonide-formoterol (SYMBICORT) 160-4.5 MCG/ACT inhaler Inhale 2 puffs into the lungs in the morning and at bedtime. 10.2 g 2   buPROPion (WELLBUTRIN XL) 150 MG 24 hr tablet Take 1 tablet (150 mg total) by mouth every morning. 30  tablet 3   hydrALAZINE (APRESOLINE) 25 MG tablet Take 3 tablets (75 mg total) by mouth 3 (three) times daily. 270 tablet 1   insulin glargine (LANTUS SOLOSTAR) 100 UNIT/ML Solostar Pen Inject 30 Units into the skin daily. 6 mL 3   insulin lispro (HUMALOG KWIKPEN) 100 UNIT/ML  KwikPen Inject 4 Units into the skin 3 (three) times daily with meals. 15 mL 0   Insulin Pen Needle 31G X 5 MM MISC Use with insulin pens (Patient taking differently: 1 each by Other route See admin instructions. Use with insulin pens) 100 each 1   isosorbide mononitrate (IMDUR) 60 MG 24 hr tablet Take 2 tablets (120 mg total) by mouth daily. 60 tablet 2   lidocaine (LIDODERM) 5 % Place 1 patch onto the skin daily as needed (For chestwall pain). Remove & Discard patch within 12 hours or as directed by MD 30 patch 0   nitroGLYCERIN (NITROSTAT) 0.4 MG SL tablet Place 1 tablet (0.4 mg total) under the tongue every 5 (five) minutes as needed for chest pain. 25 tablet 0   pantoprazole (PROTONIX) 40 MG tablet Take 1 tablet (40 mg total) by mouth daily as needed (for heartburn). 30 tablet 2   traZODone (DESYREL) 50 MG tablet Take 1 tablet (50 mg total) by mouth at bedtime as needed for sleep. 30 tablet 3   Musculoskeletal: Strength & Muscle Tone: within normal limits Gait & Station: normal Patient leans: N/A  Psychiatric Specialty Exam:  Presentation  General Appearance: Casual  Eye Contact:Fair  Speech:Clear and Coherent  Speech Volume:Normal  Handedness:Right  Mood and Affect  Mood:Depressed  Affect:Congruent; Appropriate  Thought Process  Thought Processes:Coherent; Goal Directed  Descriptions of Associations:Intact  Orientation:Full (Time, Place and Person)  Thought Content:Logical (baseline)  History of Schizophrenia/Schizoaffective disorder:No  Duration of Psychotic Symptoms:N/A  Hallucinations:Hallucinations: None  Ideas of Reference:None  Suicidal Thoughts:Suicidal Thoughts: No (none active at  this time)  Homicidal Thoughts:Homicidal Thoughts: No   Sensorium  Memory:Immediate Fair; Recent Fair; Remote Fair  Judgment:Poor  Insight:Shallow  Executive Functions  Concentration:Fair  Attention Span:Fair  [place]  Psychomotor Activity  Psychomotor Activity:Psychomotor Activity: Normal  Assets  Assets:Physical Health; Resilience  Sleep  Sleep:Sleep: Good  Physical Exam: Physical Exam Vitals and nursing note reviewed.  HENT:     Head: Normocephalic.     Nose: Rhinorrhea present.     Mouth/Throat:     Mouth: Mucous membranes are moist.     Pharynx: Oropharynx is clear.  Eyes:     Pupils: Pupils are equal, round, and reactive to light.  Cardiovascular:     Rate and Rhythm: Normal rate.     Pulses: Normal pulses.  Pulmonary:     Effort: Pulmonary effort is normal.  Abdominal:     Palpations: Abdomen is soft.  Musculoskeletal:        General: Normal range of motion.     Cervical back: Normal range of motion.  Skin:    General: Skin is warm and dry.  Neurological:     Mental Status: He is alert and oriented to person, place, and time. Mental status is at baseline.  Psychiatric:        Attention and Perception: Attention and perception normal.        Mood and Affect: Mood is depressed.        Speech: Speech normal.        Behavior: Behavior is cooperative.        Thought Content: Thought content is not paranoid or delusional. Thought content does not include homicidal or suicidal ideation. Thought content does not include homicidal or suicidal plan.        Cognition and Memory: Cognition and memory normal.        Judgment: Judgment normal.  Review of Systems  Psychiatric/Behavioral:  Positive for depression and substance abuse.   All other systems reviewed and are negative. Blood pressure (!) 154/88, pulse 87, temperature 98.3 F (36.8 C), temperature source Oral, resp. rate 16, height 5\' 8"  (1.727 m),  weight 74 kg, SpO2 99 %. Body mass index is 24.81 kg/m.  Treatment Plan Summary: Plan Discharge patient with outpatient resources for individual follow-up. Resources provided in AVS   Disposition: No evidence of imminent risk to self or others at present.   Patient does not meet criteria for psychiatric inpatient admission. Supportive therapy provided about ongoing stressors. Discussed crisis plan, support from social network, calling 911, coming to the Emergency Department, and calling Suicide Hotline.  This service was provided via telemedicine using a 2-way, interactive audio and video technology.  Names of all persons participating in this telemedicine service and their role in this encounter. Name: Oneida Alar Role: PMHNP  Name: [place] Role: Attending MD  Name: [name] Role: patient  Name:  Role:     Inda Merlin, NP 11/18/2021 12:32 PM

## 2021-11-18 NOTE — BH Assessment (Signed)
Comprehensive Clinical Assessment (CCA) Note  11/18/2021 Michael [name] [id number]  Disposition: Darlyne Russian, PA-C recommends pt to be observed and reassessed by psychiatry. Disposition discussed [name], Therapist, sports.   [facility] ED from 11/17/2021 in [place] DEPT ED from 11/11/2021 in [facility] ED from 11/03/2021 in [facility] DEPT  C-SSRS RISK CATEGORY High Risk High Risk High Risk      The patient demonstrates the following risk factors for suicide: Chronic risk factors for suicide include: psychiatric disorder of Cocaine abuse with cocaine-induced mood disorder ([place]) and previous suicide attempts Pt reports, she attempted suicide 4-5 months ago . Acute risk factors for suicide include: social withdrawal/isolation and Homelessness . Protective factors for this patient include:  None . Considering these factors, the overall suicide risk at this point appears to be high. Patient is not appropriate for outpatient follow up.  Michael [name] is a 71 year old male who presents voluntary and unaccompanied to [facility]. Clinician asked the pt, "what brought you to the hospital?" Pt reports, he's been having suicidal thoughts on and off everyday, having shortness of breath and a cold for three days. Pt reports, he becomes suicidal when he's stressed out. Per pt, his lifestyle and homelessness are stressors. Clinician asked the pt if he had a plan. Pt denies, having a plan but reports, "whatever happens." Pt reports, he wasn't going to do anything but he feels like a burden when he's suicidal. Pt reports, he's had homicidal thoughts recently towards no specific person. Per pt, he moved to [name] a month ago from [place]. Pt reports, he had a home in [place] but couldn't find a place in [place] and is homeless. Pt reports, 4-5 months ago he cut his wrist with a knife as a suicide  attempt. Pt denies, AVH.    Pt reports, he smoked about $40.00 of Crack Cocaine. Pt reports, he uses drugs to distract him. Pt's UDS is pending. Pt denies, being linked to OPT resources (medication management and/or counseling.)  Pt reports, years ago he was linked to [facility] and at a inpatient facility.   Pt presents quiet, awake in scrubs with normal speech. Pt's mood, affect was depressed. Pt's insight was fair. Pt's judgement was poor. Clinician asked the pt, "what type of help is he seeking?" Pt reports, "I want to get clean, I want help."   Diagnosis: Cocaine abuse with cocaine-induced mood disorder ([place])  *Pt declined for clinician to contact family and/or friends to obtain additional information. Pt denies having supports.*  Chief Complaint:  Chief Complaint  Patient presents with   Suicidal   Visit Diagnosis:     CCA Screening, Triage and Referral (STR)  Patient Reported Information How did you hear about [place]? Self  What Is the Reason for Your Visit/Call Today? Per EDP note: "Patient presents to ER chief complaint of chest pain off and on for a year. Also states is intermittent shortness of breath and cough. Also complaining of thoughts of wanting to end his life. Has no concrete plan but feeling depressed."  How Long Has This Been Causing You Problems? > than 6 months  What Do You Feel Would Help You the Most Today? Alcohol or Drug Use Treatment; Treatment for Depression or other mood problem   Have You Recently Had Any Thoughts About Hurting Yourself? Yes  Are You Planning to Commit Suicide/Harm Yourself At This time? No   Have you Recently Had Thoughts About  Hurting Someone [name]? No  Are You Planning to Harm Someone at This Time? No  Explanation: No data recorded  Have You Used Any Alcohol or Drugs in the Past 24 Hours? Yes  How Long Ago Did You Use Drugs or Alcohol? No data recorded What Did You Use and How Much? Pt reports, he smoked about $40.00 of Crack  Cocaine.   Do You Currently Have a Therapist/Psychiatrist? No  Name of Therapist/Psychiatrist: No data recorded  Have You Been Recently Discharged From Any Office Practice or Programs? No  Explanation of Discharge From Practice/Program: No data recorded    CCA Screening Triage Referral Assessment Type of Contact: Tele-Assessment  Telemedicine Service Delivery:   Is this Initial or Reassessment? Initial Assessment  Date Telepsych consult ordered in [facility]:  11/17/21  Time Telepsych consult ordered in [name]:  2131  Location of Assessment: [facility]  Provider Location: [facility] Assessment Services   Collateral Involvement: Pt declined for clinician to contact family and/or friends to obtain additional information. Pt denies having supports.   Does Patient Have a Stage manager Guardian? No data recorded Name and Contact of Legal Guardian: No data recorded If Minor and Not Living with Parent(s), Who has Custody? N/A  Is CPS involved or ever been involved? Never (Per chart.)  Is APS involved or ever been involved? Never (Per chart.)   Patient Determined To Be At Risk for Harm To Self or Others Based on Review of Patient Reported Information or Presenting Complaint? Yes, for Self-Harm  Method: No data recorded Availability of Means: No data recorded Intent: No data recorded Notification Required: No data recorded Additional Information for Danger to Others Potential: No data recorded Additional Comments for Danger to Others Potential: No data recorded Are There Guns or Other Weapons in Your Home? No data recorded Types of Guns/Weapons: No data recorded Are These Weapons Safely Secured?                            No data recorded Who Could Verify You Are Able To Have These Secured: No data recorded Do You Have any Outstanding Charges, Pending Court Dates, Parole/Probation? No data recorded Contacted To Inform of Risk of Harm To Self or Others: Other: Comment (No one to  contact.)    Does Patient Present under Involuntary Commitment? No  IVC Papers Initial File Date: No data recorded  [place] of Residence: [place]   Patient Currently Receiving the Following Services: Not Receiving Services   Determination of Need: Urgent (48 hours)   Options For Referral: Facility-Based Crisis; Medication Management; Inpatient Hospitalization; [facility] Urgent Care; Outpatient Therapy     CCA Biopsychosocial Patient Reported Schizophrenia/Schizoaffective Diagnosis in Past: No   Strengths: Pt reports, wanting help.   Mental Health Symptoms Depression:   Fatigue; Hopelessness; Sleep (too much or little); Worthlessness; Tearfulness; Difficulty Concentrating; Irritability; Change in energy/activity; Increase/decrease in appetite; Weight gain/loss (Guilt, isolation.)   Duration of Depressive symptoms:  Duration of Depressive Symptoms: Greater than two weeks   Mania:   None   Anxiety:    Sleep; Worrying; Tension; Difficulty concentrating   Psychosis:   None   Duration of Psychotic symptoms:    Trauma:   None   Obsessions:   None   Compulsions:   None   Inattention:   None   Hyperactivity/Impulsivity:   Feeling of restlessness; Fidgets with hands/feet   Oppositional/Defiant Behaviors:   Angry   Emotional Irregularity:  Recurrent suicidal behaviors/gestures/threats; Mood lability; Potentially harmful impulsivity   Other Mood/Personality Symptoms:   None noted    Mental Status Exam Appearance and self-care  Stature:   Average   Weight:   Average weight   Clothing:   -- (Pt in scrubs.)   Grooming:   Normal   Cosmetic use:   None   Posture/gait:   Normal   Motor activity:   Not Remarkable   Sensorium  Attention:   Normal   Concentration:   Normal   Orientation:   X5   Recall/memory:   Normal   Affect and Mood  Affect:   Depressed   Mood:   Depressed   Relating  Eye contact:   Normal   Facial  expression:   Depressed   Attitude toward examiner:   Cooperative   Thought and Language  Speech flow:  Normal   Thought content:   Appropriate to Mood and Circumstances   Preoccupation:   None   Hallucinations:   None   Organization:  No data recorded  [employer] of Knowledge:   Average   Intelligence:   Average   Abstraction:   Normal   Judgement:   Poor   Reality Testing:   Adequate   Insight:   Fair   Decision Making:   Impulsive   Social Functioning  Social Maturity:   Impulsive   Social Judgement:   "Street Smart"   Stress  Stressors:   Housing; Illness; Relationship   Coping Ability:   Overwhelmed; Deficient supports   Skill Deficits:   Decision making; Self-control   Supports:   Support needed     Religion: Religion/Spirituality Are You A Religious Person?: No What is Your Religious Affiliation?: Methodist How Might This Affect Treatment?: Not assessed  Leisure/Recreation: Leisure / Recreation Do You Have Hobbies?: No  Exercise/Diet: Exercise/Diet Do You Exercise?: No Have You Gained or Lost A Significant Amount of Weight in the Past Six Months?: No Number of Pounds Lost?:  (Pt reprots, he lost 22 lbs.) Do You Follow a Special Diet?: No Do You Have Any Trouble Sleeping?: Yes Explanation of Sleeping Difficulties: Pt reports getting 3 hours of sleep/night. Pt reports he's homeless and sleeps outside on the streets.   CCA Employment/Education Employment/Work Situation: Employment / Work Situation Employment Situation: On disability Why is Patient on Disability: Pt reports he got injured on the job. Pt has medical illnesses. How Long has Patient Been on Disability: Pt reports he was injured on July 07, 1984. Patient's Job has Been Impacted by Current Illness: No Has Patient ever Been in the [employer]?: No  Education: Education Last Grade Completed: 12 Did [place]?: No Did You Have An  Individualized Education Program (IIEP): No Did You Have Any Difficulty At School?: No   CCA Family/Childhood History Family and Relationship History: Family history Marital status: Widowed Widowed, when?: Pt reports his wife died two years ago. Does patient have children?: Yes How many children?: 2  Childhood History:  Childhood History By whom was/is the patient raised?: Both parents (Per chart.) Did patient suffer any verbal/emotional/physical/sexual abuse as a child?: No Did patient suffer from severe childhood neglect?: No Has patient ever been sexually abused/assaulted/raped as an adolescent or adult?: No Was the patient ever a victim of a crime or a disaster?: No Witnessed domestic violence?: No Has patient been affected by domestic violence as an adult?:  (NA)  Child/Adolescent Assessment:     CCA Substance Use Alcohol/Drug Use:  Alcohol / Drug Use Pain Medications: See MAR Prescriptions: See MAR Over the Counter: See MAR History of alcohol / drug use?: Yes Substance #1 Name of Substance 1: Crack Cocaine. 1 - Age of First Use: UTA 1 - Amount (size/oz): Pt reports, smoking about $40.00 Crack Cocaine several days ago. 1 - Frequency: Ongoing. 1 - Duration: Ongoing. 1 - Last Use / Amount: Several days ago. 1 - Method of Aquiring: Purchase. 1- Route of Use: Inhalation.    ASAM's:  Six Dimensions of Multidimensional Assessment  Dimension 1:  Acute Intoxication and/or Withdrawal Potential:      Dimension 2:  Biomedical Conditions and Complications:      Dimension 3:  Emotional, Behavioral, or Cognitive Conditions and Complications:     Dimension 4:  Readiness to Change:     Dimension 5:  Relapse, Continued use, or Continued Problem Potential:     Dimension 6:  Recovery/Living Environment:     ASAM Severity Score:    ASAM Recommended Level of Treatment:     Substance use Disorder (SUD)    Recommendations for Services/Supports/Treatments: Recommendations for  Services/Supports/Treatments Recommendations For Services/Supports/Treatments: Other (Comment) (Pt to be observed and reassessed by psychiatry.)  Discharge Disposition:    DSM5 Diagnoses: Patient Active Problem List   Diagnosis Date Noted   Iron deficiency anemia 04/04/2021   Atypical chest pain 04/03/2021   Homelessness 12/13/2019   Cellulitis 12/07/2019   Osteomyelitis ([place]) 07/17/2019   Abscess or cellulitis of toe, right    Toe pain, right-second    MDD (major depressive disorder), recurrent episode, severe ([place]) 06/15/2019   Respiratory distress 04/12/2019   Pneumonia due to severe acute respiratory syndrome coronavirus 2 (SARS-CoV-2) 04/11/2019   COVID-19 virus infection 03/19/2019   Chest pain 10/03/2018   Malnutrition of moderate degree 09/21/2018   MDD (major depressive disorder), recurrent episode, moderate ([name])    Type II diabetes mellitus with renal manifestations ([place]) 05/04/2018   GERD (gastroesophageal reflux disease) 05/04/2018   S/P CABG (coronary artery bypass graft)    Left testicular pain    Depression 02/20/2018   Epididymo-orchitis, acute 02/20/2018   Essential hypertension 02/20/2018   Precordial chest pain 02/20/2018   AKI (acute kidney injury) ([place]) 02/14/2018   HCAP (healthcare-associated pneumonia) 02/14/2018   History of pulmonary embolism 07/04/2017   Acute hyponatremia 05/11/2017   Hyperglycemia due to type 2 diabetes mellitus ([place]) 05/11/2017   CHF (congestive heart failure) ([place]) 04/01/2017   Hepatitis C 12/30/2016   Substance induced mood disorder ([place]) 11/23/2016   Chronic ischemic heart disease 11/12/2016   Paroxysmal A-fib ([place]) 11/12/2016   Back pain 04/18/2016   S/P carotid endarterectomy 11/15/2015   MDD (major depressive disorder), recurrent severe, without psychosis ([place]) 09/09/2015   Cocaine-induced mood disorder ([place]) 08/14/2015   Cocaine abuse with cocaine-induced mood disorder ([place]) 08/14/2015   Gout 07/10/2015   Acute renal  failure superimposed on stage 3 chronic kidney disease ([place]) 03/06/2015   Anemia 03/06/2015   Chronic kidney disease, stage III (moderate) ([name]) 03/06/2015   Hypoglycemia    Encounter for general adult medical examination with abnormal findings 02/09/2015   Cocaine use disorder, moderate, dependence ([place]) 12/13/2014   Substance or medication-induced depressive disorder with onset during withdrawal ([place]) 12/13/2014   Severe recurrent major depressive disorder with psychotic features ([place]) 12/12/2014   Major depressive disorder, recurrent, severe without psychotic features ([place])    Cervicalgia 06/28/2014   Lumbar radiculopathy, chronic 06/28/2014   Asthma, chronic 02/03/2014   3-vessel CAD 06/24/2013  ED (erectile dysfunction) of organic origin 07/07/2012   Cocaine abuse, continuous ([place]) 05/13/2012   Hypertension goal BP (blood pressure) < 140/80 04/29/2012   Chondromalacia of left knee 03/19/2012   Hyperlipidemia with target LDL less than 100 02/12/2012   Fibromyalgia 02/12/2012   Cocaine use disorder ([place]) 01/10/2012    Class: Acute   HIV (human immunodeficiency virus infection) ([place]) 08/27/2011   Uncontrolled type 2 diabetes with neuropathy 10/17/2000     Referrals to Alternative Service(s): Referred to Alternative Service(s):   Place:   Date:   Time:    Referred to Alternative Service(s):   Place:   Date:   Time:    Referred to Alternative Service(s):   Place:   Date:   Time:    Referred to Alternative Service(s):   Place:   Date:   Time:     Vertell Novak, [facility] Comprehensive Clinical Assessment (CCA) Screening, Triage and Referral Note  11/18/2021 Michael [name] [id number]  Chief Complaint:  Chief Complaint  Patient presents with   Suicidal   Visit Diagnosis:   Patient Reported Information How did you hear about [place]? Self  What Is the Reason for Your Visit/Call Today? Per EDP note: "Patient presents to ER chief complaint of chest pain off and on for a  year. Also states is intermittent shortness of breath and cough. Also complaining of thoughts of wanting to end his life. Has no concrete plan but feeling depressed."  How Long Has This Been Causing You Problems? > than 6 months  What Do You Feel Would Help You the Most Today? Alcohol or Drug Use Treatment; Treatment for Depression or other mood problem   Have You Recently Had Any Thoughts About Hurting Yourself? Yes  Are You Planning to Commit Suicide/Harm Yourself At This time? No   Have you Recently Had Thoughts About [place]? No  Are You Planning to Harm Someone at This Time? No  Explanation: No data recorded  Have You Used Any Alcohol or Drugs in the Past 24 Hours? Yes  How Long Ago Did You Use Drugs or Alcohol? No data recorded What Did You Use and How Much? Pt reports, he smoked about $40.00 of Crack Cocaine.   Do You Currently Have a Therapist/Psychiatrist? No  Name of Therapist/Psychiatrist: No data recorded  Have You Been Recently Discharged From Any Office Practice or Programs? No  Explanation of Discharge From Practice/Program: No data recorded   CCA Screening Triage Referral Assessment Type of Contact: Tele-Assessment  Telemedicine Service Delivery:   Is this Initial or Reassessment? Initial Assessment  Date Telepsych consult ordered in [facility]:  11/17/21  Time Telepsych consult ordered in [facility]:  2131  Location of Assessment: [facility]  Provider Location: [facility] Assessment Services   Collateral Involvement: Pt declined for clinician to contact family and/or friends to obtain additional information. Pt denies having supports.   Does Patient Have a Stage manager Guardian? No data recorded Name and Contact of Legal Guardian: No data recorded If Minor and Not Living with Parent(s), Who has Custody? N/A  Is CPS involved or ever been involved? Never (Per chart.)  Is APS involved or ever been involved? Never (Per chart.)   Patient  Determined To Be At Risk for Harm To Self or Others Based on Review of Patient Reported Information or Presenting Complaint? Yes, for Self-Harm  Method: No data recorded Availability of Means: No data recorded Intent: No data recorded Notification Required: No data recorded Additional Information  for Danger to Others Potential: No data recorded Additional Comments for Danger to Others Potential: No data recorded Are There Guns or Other Weapons in [place]? No data recorded Types of Guns/Weapons: No data recorded Are These Weapons Safely Secured?                            No data recorded Who Could Verify You Are Able To Have These Secured: No data recorded Do You Have any Outstanding Charges, Pending Court Dates, Parole/Probation? No data recorded Contacted To Inform of Risk of Harm To Self or Others: Other: Comment (No one to contact.)   Does Patient Present under Involuntary Commitment? No  IVC Papers Initial File Date: No data recorded  [place] of Residence: [place]   Patient Currently Receiving the Following Services: Not Receiving Services   Determination of Need: Urgent (48 hours)   Options For Referral: Facility-Based Crisis; Medication Management; Inpatient Hospitalization; [facility] Urgent Care; Outpatient Therapy   Discharge Disposition:     Vertell Novak, [place], [place], [facility], [facility] Triage Specialist [phone]

## 2021-11-18 NOTE — ED Provider Notes (Addendum)
Emergency Medicine Observation Re-evaluation Note  Michael [name] is a 71 y.o. male, seen on rounds today.  Pt initially presented to the ED for complaints of Suicidal Currently, the patient is resting, but easily arousable.  Pt was assessed by TTS last night.  Pt has no complaints.  Physical Exam  BP 137/67 (BP Location: Right Arm)    Pulse 83    Temp 98.9 F (37.2 C)    Resp 16    Ht 5\' 8"  (1.727 m)    Wt 74 kg    SpO2 100%    BMI 24.81 kg/m  Physical Exam General: nad Cardiac: rrr Lungs: cta b Psych: calm  ED Course / MDM  EKG:EKG Interpretation  Date/Time:  Saturday November 17 2021 20:16:09 EST Ventricular Rate:  86 PR Interval:  138 QRS Duration: 83 QT Interval:  363 QTC Calculation: 435 R Axis:   77 Text Interpretation: Sinus rhythm Consider left ventricular hypertrophy Confirmed by Thamas Jaegers ([id number]) on 11/17/2021 8:47:43 PM  I have reviewed the labs performed to date as well as medications administered while in observation.  Recent changes in the last 24 hours include TTS eval.  Plan  Current plan is for re-eval this am by TTS.  [name] is not under involuntary commitment.  Pt is psych cleared for d/c.  He will go back to the [place].     Isla Pence, MD 11/18/21 [id number]    Isla Pence, MD 11/18/21 1515

## 2021-11-18 NOTE — Progress Notes (Signed)
Behavioral health resources added to AVS per request of Oneida Alar, NP.    Benjaman Kindler, MSW, [facility] 11/18/2021 12:29 PM

## 2021-11-20 ENCOUNTER — Emergency Department (HOSPITAL_COMMUNITY): Payer: Medicare Other

## 2021-11-20 ENCOUNTER — Encounter (HOSPITAL_COMMUNITY): Payer: Self-pay

## 2021-11-20 ENCOUNTER — Emergency Department (HOSPITAL_COMMUNITY)
Admission: EM | Admit: 2021-11-20 | Discharge: 2021-11-21 | Disposition: A | Discharge status: Home / Self Care | Payer: Medicare Other | Attending: Emergency Medicine | Admitting: Emergency Medicine

## 2021-11-20 ENCOUNTER — Other Ambulatory Visit: Payer: Self-pay

## 2021-11-20 DIAGNOSIS — Z7982 Long term (current) use of aspirin: Secondary | ICD-10-CM | POA: Insufficient documentation

## 2021-11-20 DIAGNOSIS — R0789 Other chest pain: Secondary | ICD-10-CM | POA: Insufficient documentation

## 2021-11-20 DIAGNOSIS — R059 Cough, unspecified: Secondary | ICD-10-CM | POA: Diagnosis not present

## 2021-11-20 DIAGNOSIS — R778 Other specified abnormalities of plasma proteins: Secondary | ICD-10-CM | POA: Insufficient documentation

## 2021-11-20 DIAGNOSIS — F1424 Cocaine dependence with cocaine-induced mood disorder: Secondary | ICD-10-CM | POA: Insufficient documentation

## 2021-11-20 DIAGNOSIS — Z20822 Contact with and (suspected) exposure to covid-19: Secondary | ICD-10-CM | POA: Diagnosis not present

## 2021-11-20 DIAGNOSIS — F1994 Other psychoactive substance use, unspecified with psychoactive substance-induced mood disorder: Secondary | ICD-10-CM | POA: Insufficient documentation

## 2021-11-20 DIAGNOSIS — Z5900 Homelessness unspecified: Secondary | ICD-10-CM | POA: Diagnosis not present

## 2021-11-20 DIAGNOSIS — Z794 Long term (current) use of insulin: Secondary | ICD-10-CM | POA: Insufficient documentation

## 2021-11-20 DIAGNOSIS — R0981 Nasal congestion: Secondary | ICD-10-CM | POA: Insufficient documentation

## 2021-11-20 DIAGNOSIS — Y900 Blood alcohol level of less than 20 mg/100 ml: Secondary | ICD-10-CM | POA: Diagnosis not present

## 2021-11-20 DIAGNOSIS — Z046 Encounter for general psychiatric examination, requested by authority: Secondary | ICD-10-CM | POA: Diagnosis present

## 2021-11-20 DIAGNOSIS — F19239 Other psychoactive substance dependence with withdrawal, unspecified: Secondary | ICD-10-CM | POA: Diagnosis present

## 2021-11-20 DIAGNOSIS — F1924 Other psychoactive substance dependence with psychoactive substance-induced mood disorder: Secondary | ICD-10-CM | POA: Diagnosis not present

## 2021-11-20 DIAGNOSIS — F1414 Cocaine abuse with cocaine-induced mood disorder: Secondary | ICD-10-CM | POA: Diagnosis present

## 2021-11-20 DIAGNOSIS — R45851 Suicidal ideations: Secondary | ICD-10-CM | POA: Diagnosis not present

## 2021-11-20 LAB — CBC WITH DIFFERENTIAL/PLATELET
Abs Immature Granulocytes: 0.02 10*3/uL (ref 0.00–0.07)
Basophils Absolute: 0 10*3/uL (ref 0.0–0.1)
Basophils Relative: 0 %
Eosinophils Absolute: 0.2 10*3/uL (ref 0.0–0.5)
Eosinophils Relative: 4 %
HCT: 28.7 % — ABNORMAL LOW (ref 39.0–52.0)
Hemoglobin: 9.3 g/dL — ABNORMAL LOW (ref 13.0–17.0)
Immature Granulocytes: 0 %
Lymphocytes Relative: 21 %
Lymphs Abs: 0.9 10*3/uL (ref 0.7–4.0)
MCH: 27.7 pg (ref 26.0–34.0)
MCHC: 32.4 g/dL (ref 30.0–36.0)
MCV: 85.4 fL (ref 80.0–100.0)
Monocytes Absolute: 0.4 10*3/uL (ref 0.1–1.0)
Monocytes Relative: 8 %
Neutro Abs: 3 10*3/uL (ref 1.7–7.7)
Neutrophils Relative %: 67 %
Platelets: 132 10*3/uL — ABNORMAL LOW (ref 150–400)
RBC: 3.36 MIL/uL — ABNORMAL LOW (ref 4.22–5.81)
RDW: 12.9 % (ref 11.5–15.5)
WBC: 4.5 10*3/uL (ref 4.0–10.5)
nRBC: 0 % (ref 0.0–0.2)

## 2021-11-20 LAB — RESP PANEL BY RT-PCR (FLU A&B, COVID) ARPGX2
Influenza A by PCR: NEGATIVE
Influenza B by PCR: NEGATIVE
SARS Coronavirus 2 by RT PCR: NEGATIVE

## 2021-11-20 LAB — COMPREHENSIVE METABOLIC PANEL
ALT: 16 U/L (ref 0–44)
AST: 17 U/L (ref 15–41)
Albumin: 2.9 g/dL — ABNORMAL LOW (ref 3.5–5.0)
Alkaline Phosphatase: 65 U/L (ref 38–126)
Anion gap: 8 (ref 5–15)
BUN: 39 mg/dL — ABNORMAL HIGH (ref 8–23)
CO2: 20 mmol/L — ABNORMAL LOW (ref 22–32)
Calcium: 8.6 mg/dL — ABNORMAL LOW (ref 8.9–10.3)
Chloride: 108 mmol/L (ref 98–111)
Creatinine, Ser: 2.43 mg/dL — ABNORMAL HIGH (ref 0.61–1.24)
GFR, Estimated: 28 mL/min — ABNORMAL LOW (ref >60)
Glucose, Bld: 204 mg/dL — ABNORMAL HIGH (ref 70–99)
Potassium: 3.9 mmol/L (ref 3.5–5.1)
Sodium: 136 mmol/L (ref 135–145)
Total Bilirubin: 0.3 mg/dL (ref 0.3–1.2)
Total Protein: 5.9 g/dL — ABNORMAL LOW (ref 6.5–8.1)

## 2021-11-20 LAB — URINALYSIS, ROUTINE W REFLEX MICROSCOPIC
Bilirubin Urine: NEGATIVE
Glucose, UA: 50 mg/dL — AB
Ketones, ur: NEGATIVE mg/dL
Leukocytes,Ua: NEGATIVE
Nitrite: NEGATIVE
Protein, ur: 100 mg/dL — AB
Specific Gravity, Urine: 1.013 (ref 1.005–1.030)
pH: 5 (ref 5.0–8.0)

## 2021-11-20 LAB — RAPID URINE DRUG SCREEN, HOSP PERFORMED
Amphetamines: NOT DETECTED
Barbiturates: NOT DETECTED
Benzodiazepines: NOT DETECTED
Cocaine: POSITIVE — AB
Opiates: NOT DETECTED
Tetrahydrocannabinol: NOT DETECTED

## 2021-11-20 LAB — CBG MONITORING, ED: Glucose-Capillary: 282 mg/dL — ABNORMAL HIGH (ref 70–99)

## 2021-11-20 LAB — TROPONIN I (HIGH SENSITIVITY)
Troponin I (High Sensitivity): 19 ng/L — ABNORMAL HIGH (ref <18)
Troponin I (High Sensitivity): 20 ng/L — ABNORMAL HIGH (ref <18)

## 2021-11-20 LAB — ETHANOL: Alcohol, Ethyl (B): <10 mg/dL (ref <10)

## 2021-11-20 LAB — PROTIME-INR
INR: 0.9 (ref 0.8–1.2)
Prothrombin Time: 12 seconds (ref 11.4–15.2)

## 2021-11-20 LAB — ACETAMINOPHEN LEVEL: Acetaminophen (Tylenol), Serum: <10 ug/mL — ABNORMAL LOW (ref 10–30)

## 2021-11-20 LAB — SALICYLATE LEVEL: Salicylate Lvl: <7 mg/dL — ABNORMAL LOW (ref 7.0–30.0)

## 2021-11-20 MED ORDER — ALBUTEROL SULFATE HFA 108 (90 BASE) MCG/ACT IN AERS: 1.0000 | INHALATION_SPRAY | Freq: Four times a day (QID) | RESPIRATORY_TRACT | Status: DC | PRN

## 2021-11-20 MED ORDER — MOMETASONE FURO-FORMOTEROL FUM 200-5 MCG/ACT IN AERO
2.0000 | INHALATION_SPRAY | Freq: Two times a day (BID) | RESPIRATORY_TRACT | Status: DC
  Filled 2021-11-20: qty 8.8

## 2021-11-20 MED ORDER — AMLODIPINE BESYLATE 5 MG PO TABS
10.0000 mg | ORAL_TABLET | Freq: Every day | ORAL | Status: DC
  Administered 2021-11-20 – 2021-11-21 (×2): 10 mg via ORAL
  Filled 2021-11-20 (×2): qty 2

## 2021-11-20 MED ORDER — HYDRALAZINE HCL 25 MG PO TABS
75.0000 mg | ORAL_TABLET | Freq: Three times a day (TID) | ORAL | Status: DC
  Administered 2021-11-20 – 2021-11-21 (×3): 75 mg via ORAL
  Filled 2021-11-20 (×3): qty 3

## 2021-11-20 MED ORDER — PANTOPRAZOLE SODIUM 40 MG PO TBEC: 40.0000 mg | DELAYED_RELEASE_TABLET | Freq: Every day | ORAL | Status: DC | PRN

## 2021-11-20 MED ORDER — INSULIN ASPART 100 UNIT/ML IJ SOLN: 4.0000 [IU] | Freq: Three times a day (TID) | INTRAMUSCULAR | Status: DC

## 2021-11-20 MED ORDER — ASPIRIN EC 81 MG PO TBEC
81.0000 mg | DELAYED_RELEASE_TABLET | Freq: Every day | ORAL | Status: DC
  Administered 2021-11-20 – 2021-11-21 (×2): 81 mg via ORAL
  Filled 2021-11-20 (×2): qty 1

## 2021-11-20 MED ORDER — LIDOCAINE 5 % EX PTCH
1.0000 | MEDICATED_PATCH | Freq: Every day | CUTANEOUS | Status: DC | PRN
  Filled 2021-11-20: qty 1

## 2021-11-20 MED ORDER — INSULIN GLARGINE-YFGN 100 UNIT/ML ~~LOC~~ SOLN
30.0000 [IU] | Freq: Every day | SUBCUTANEOUS | Status: DC
  Administered 2021-11-20 – 2021-11-21 (×2): 30 [IU] via SUBCUTANEOUS
  Filled 2021-11-20 (×2): qty 0.3

## 2021-11-20 MED ORDER — INSULIN LISPRO (1 UNIT DIAL) 100 UNIT/ML (KWIKPEN)
4.0000 [IU] | PEN_INJECTOR | Freq: Three times a day (TID) | SUBCUTANEOUS | Status: DC
  Filled 2021-11-20: qty 3

## 2021-11-20 MED ORDER — ISOSORBIDE MONONITRATE ER 30 MG PO TB24
120.0000 mg | ORAL_TABLET | Freq: Every day | ORAL | Status: DC
  Administered 2021-11-20 – 2021-11-21 (×2): 120 mg via ORAL
  Filled 2021-11-20 (×2): qty 4

## 2021-11-20 MED ORDER — NITROGLYCERIN 0.4 MG SL SUBL: 0.4000 mg | SUBLINGUAL_TABLET | SUBLINGUAL | Status: DC | PRN

## 2021-11-20 MED ORDER — BICTEGRAVIR-EMTRICITAB-TENOFOV 50-200-25 MG PO TABS
1.0000 | ORAL_TABLET | Freq: Every day | ORAL | Status: DC
  Administered 2021-11-20 – 2021-11-21 (×2): 1 via ORAL
  Filled 2021-11-20 (×3): qty 1

## 2021-11-20 MED ORDER — INSULIN PEN NEEDLE 31G X 5 MM MISC: 1.0000 | Status: DC

## 2021-11-20 MED ORDER — BUPROPION HCL ER (XL) 150 MG PO TB24: 150.0000 mg | ORAL_TABLET | Freq: Every morning | ORAL | Status: DC

## 2021-11-20 MED ORDER — TRAZODONE HCL 50 MG PO TABS
50.0000 mg | ORAL_TABLET | Freq: Every evening | ORAL | Status: DC | PRN
  Administered 2021-11-20: 50 mg via ORAL
  Filled 2021-11-20: qty 1

## 2021-11-20 NOTE — ED Triage Notes (Addendum)
Pt bib ems; c/o productive cough x 4 days over past 24 hours; endorses sore throat; seen x 2 days ago for same; denies sob, lungs clear with ems; pt also endorses intermittent CP x 3-4 months, L sided, sharp, radiates to L arm; denies nausea; pt also endorsing SI; 148/80, HR 55 100% RA, CBG 230; hx DM, CABG, Stroke

## 2021-11-20 NOTE — ED Provider Notes (Signed)
[facility] EMERGENCY DEPARTMENT Provider Note   CSN: [id number] Arrival date & time: 11/20/21  0813     History  Chief Complaint  Patient presents with   Cough   Chest Pain    Michael [name] is a 71 y.o. male.  71 year old male with prior medical history as detailed below presents for evaluation.  Patient's presentation today is consistent with recent evaluations at [facility].  Patient is complaining of mild cough and upper respiratory congestion.  He reports that the symptoms have been ongoing for at least 4 days.  He denies fever.  He reports mild intermittent chest discomfort.  This chest discomfort is worse with vigorous coughing.  This appears to been ongoing for quite some time as well.  He also endorses mild SI.  He reports prior history of suicide attempts with attempted cutting of his wrist.  Patient recently on psych hold at [facility] long ED for similar statements.  He was released on February 12.  Patient is alert and comfortable during evaluation.  Past psychiatric history significant for MDD, polysubstance abuse, cocaine use disorder, Cocaine induced mood disorder  The history is provided by the patient and medical records.  Cough Cough characteristics:  Dry Sputum characteristics:  Nondescript Severity:  Mild Onset quality:  Unable to specify Duration:  2 weeks Timing:  Unable to specify Progression:  Unable to specify Chronicity:  Chronic Associated symptoms: chest pain   Chest Pain Associated symptoms: cough       Home Medications Prior to Admission medications   Medication Sig Start Date End Date Taking? Authorizing Provider  albuterol (VENTOLIN HFA) 108 (90 Base) MCG/ACT inhaler Inhale 1-2 puffs into the lungs every 6 (six) hours as needed for wheezing or shortness of breath. 10/24/21   [name], MD  amLODipine (NORVASC) 10 MG tablet Take 1 tablet (10 mg total) by mouth daily. 10/24/21   [name], MD  aspirin EC 81  MG tablet Take 81 mg by mouth daily. Swallow whole.    [provider]  bictegravir-emtricitabine-tenofovir AF (BIKTARVY) 50-200-25 MG TABS tablet Take 1 tablet by mouth daily. 10/24/21   [name], MD  budesonide-formoterol [facility]) 160-4.5 MCG/ACT inhaler Inhale 2 puffs into the lungs in the morning and at bedtime. 10/24/21   [name], MD  buPROPion (WELLBUTRIN XL) 150 MG 24 hr tablet Take 1 tablet (150 mg total) by mouth every morning. 10/24/21   [name], MD  hydrALAZINE (APRESOLINE) 25 MG tablet Take 3 tablets (75 mg total) by mouth 3 (three) times daily. 10/24/21 12/23/21  [name], MD  insulin glargine (LANTUS SOLOSTAR) 100 UNIT/ML Solostar Pen Inject 30 Units into the skin daily. 10/24/21   [name], MD  insulin lispro (HUMALOG KWIKPEN) 100 UNIT/ML KwikPen Inject 4 Units into the skin 3 (three) times daily with meals. 10/24/21   [name], MD  Insulin Pen Needle 31G X 5 MM MISC Use with insulin pens Patient taking differently: 1 each by Other route See admin instructions. Use with insulin pens 10/24/21   [name], MD  isosorbide mononitrate (IMDUR) 60 MG 24 hr tablet Take 2 tablets (120 mg total) by mouth daily. 10/24/21   [name], MD  lidocaine (LIDODERM) 5 % Place 1 patch onto the skin daily as needed (For chestwall pain). Remove & Discard patch within 12 hours or as directed by MD 10/24/21   [name], MD  nitroGLYCERIN (NITROSTAT) 0.4  MG SL tablet Place 1 tablet (0.4 mg total) under the tongue every 5 (five) minutes as needed for chest pain. 10/24/21   [name], MD  pantoprazole (PROTONIX) 40 MG tablet Take 1 tablet (40 mg total) by mouth daily as needed (for heartburn). 10/24/21   [name], MD  traZODone (DESYREL) 50 MG tablet Take 1 tablet (50 mg total) by mouth at bedtime as needed for sleep. 10/24/21   [name], MD      Allergies    Patient has no known allergies.    Review of  Systems   Review of Systems  Respiratory:  Positive for cough.   Cardiovascular:  Positive for chest pain.  All other systems reviewed and are negative.  Physical Exam Updated Vital Signs BP (!) 153/89 (BP Location: Right Arm)    Pulse 76    Temp 98.2 F (36.8 C) (Oral)    Resp 18    Ht 5\' 8"  (1.727 m)    Wt 73.9 kg    SpO2 100%    BMI 24.78 kg/m  Physical Exam Vitals and nursing note reviewed.  Constitutional:      General: He is not in acute distress.    Appearance: Normal appearance. He is well-developed.  HENT:     Head: Normocephalic and atraumatic.  Eyes:     Conjunctiva/sclera: Conjunctivae normal.     Pupils: Pupils are equal, round, and reactive to light.  Cardiovascular:     Rate and Rhythm: Normal rate and regular rhythm.     Heart sounds: Normal heart sounds.  Pulmonary:     Effort: Pulmonary effort is normal. No respiratory distress.     Breath sounds: Normal breath sounds.  Abdominal:     General: There is no distension.     Palpations: Abdomen is soft.     Tenderness: There is no abdominal tenderness.  Musculoskeletal:        General: No deformity. Normal range of motion.     Cervical back: Normal range of motion and neck supple.  Skin:    General: Skin is warm and dry.  Neurological:     General: No focal deficit present.     Mental Status: He is alert and oriented to person, place, and time.  Psychiatric:     Comments: Endorses SI, reports prior history of suicide attempt with attempted cutting of wrists    ED Results / Procedures / Treatments   Labs (all labs ordered are listed, but only abnormal results are displayed) Labs Reviewed  RESP PANEL BY RT-PCR (FLU A&B, COVID) ARPGX2  ACETAMINOPHEN LEVEL  SALICYLATE LEVEL  CBC WITH DIFFERENTIAL/PLATELET  PROTIME-INR  COMPREHENSIVE METABOLIC PANEL  URINALYSIS, ROUTINE W REFLEX MICROSCOPIC  RAPID URINE DRUG SCREEN, HOSP PERFORMED  ETHANOL  TROPONIN I (HIGH SENSITIVITY)    EKG EKG  Interpretation  Date/Time:  Tuesday November 20 2021 08:19:30 EST Ventricular Rate:  67 PR Interval:  149 QRS Duration: 84 QT Interval:  369 QTC Calculation: 390 R Axis:   70 Text Interpretation: Sinus rhythm Supraventricular bigeminy Consider left ventricular hypertrophy Confirmed by Dene Gentry [phone]) on 11/20/2021 8:42:38 AM  Radiology No results found.  Procedures Procedures    Medications Ordered in ED Medications - No data to display  ED Course/ Medical Decision Making/ A&P                           Medical Decision Making Amount and/or Complexity of  Data Reviewed Labs: ordered. Radiology: ordered.    Medical Screen Complete  This patient presented to the ED with complaint of chest discomfort, cough, congestion, SI.  This complaint involves an extensive number of treatment options. The initial differential diagnosis includes, but is not limited to, viral infection, bacterial infection, ACS, polysubstance use, metabolic abnormality, emergent mental health issue, malingering, etc.  This presentation is: Acute, Chronic, Self-Limited, Previously Undiagnosed, Uncertain Prognosis, Complicated, Systemic Symptoms, and Threat to Life/Bodily Function Patient is presenting with a host of complaints.  Majority of patient's complaints are unchanged from recent evaluations at [facility].  Patient does continue endorse SI.  Medical screening exam performed here is without evidence of acute medical pathology.  Describe symptoms are inconsistent with likely ACS.  EKG is without evidence of acute ischemia.  Troponin with minimal elevation and no delta.  Cocaine is positive on the patient's tox screen.  Patient is continuing to endorse SI throughout his ED evaluation.  TTS/psych eval pending.  Patient is medically clear at this time for psychiatric evaluation.  Final disposition is dependent upon psychiatric evaluation and plan of care.   Co morbidities that complicated  the patient's evaluation  Polysubstance abuse, MDD, cocaine use   Additional history obtained:  External records from outside sources obtained and reviewed including prior ED visits and prior Inpatient records.    Lab Tests:  I ordered and personally interpreted labs.  The pertinent results include: CBC, acetaminophen, salicylate, EtOH, CMP, troponin, COVID, flu, UA, U tox   Imaging Studies ordered:  I ordered imaging studies including chest x-ray I independently visualized and interpreted obtained imaging which showed NAD I agree with the radiologist interpretation.   Cardiac Monitoring:  The patient was maintained on a cardiac monitor.  I personally viewed and interpreted the cardiac monitor which showed an underlying rhythm of: NSR  Problem List / ED Course:  Upper respiratory congestion, atypical chest discomfort, SI   Reevaluation:  After the interventions noted above, I reevaluated the patient and found that they have: improved    Disposition:  After consideration of the diagnostic results and the patients response to treatment, I feel that the patent would benefit from [facility] evaluation.           Final Clinical Impression(s) / ED Diagnoses Final diagnoses:  Suicidal ideation    Rx / DC Orders ED Discharge Orders     None         Valarie Merino, MD 11/20/21 1240

## 2021-11-20 NOTE — ED Notes (Signed)
Pt changed into scrubs and wanded by Security.  

## 2021-11-20 NOTE — ED Notes (Signed)
2 unsuccessful attempts to draw 2nd troponin; Phlebotomy notified to collect

## 2021-11-20 NOTE — ED Notes (Signed)
Got  patient undressed into a gown did ekg shown to er provider patient is resting with call bell in reach

## 2021-11-20 NOTE — ED Notes (Signed)
Pt's belongings collected, placed in bag; pt wanted by security; pt calm, cooperative, pleasant at this time

## 2021-11-20 NOTE — BH Assessment (Signed)
Clinician message Jewel Baize, RN: "Hey. It's Trey with TTS. Is the pt able to engage in the assessment, if so the pt will need to be placed in a private room. Also is the pt under IVC?"   Clinicain awaiting response.    Vertell Novak, [place], [facility], [facility] Triage Specialist [phone]
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
## 2021-11-21 DIAGNOSIS — F1924 Other psychoactive substance dependence with psychoactive substance-induced mood disorder: Secondary | ICD-10-CM

## 2021-11-21 DIAGNOSIS — F19239 Other psychoactive substance dependence with withdrawal, unspecified: Secondary | ICD-10-CM

## 2021-11-21 LAB — CBG MONITORING, ED
Glucose-Capillary: 87 mg/dL (ref 70–99)
Glucose-Capillary: 99 mg/dL (ref 70–99)

## 2021-11-21 NOTE — Discharge Instructions (Signed)
For your behavioral health needs you are advised to follow up with an outpatient psychiatrist.  Contact one of the following providers at your earliest opportunity to schedule an intake appointment:       Gerald Plovsky, MD      [facility]      [address], [address]      [place], [place] [address]      [phone]       Vincent Izediuno, MD      [facility]      [address]      [place], [place] [address]      [phone]   
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
## 2021-11-21 NOTE — ED Notes (Signed)
Waiting for SW to come and bring resources for pt and then will d/c the pt.

## 2021-11-21 NOTE — ED Notes (Signed)
Breakfast Orders Placed °

## 2021-11-21 NOTE — BH Assessment (Signed)
Comprehensive Clinical Assessment (CCA) Note  11/21/2021 Michael [name] [id number]  Disposition: Margorie John, PA-C recommends pt to be observed and reassessed by psychiatry. Disposition discussed with Gust Rung. Sines, Therapist, sports.   [place] ED from 11/20/2021 in [place] ED from 11/17/2021 in [place] DEPT ED from 11/11/2021 in [place] High Risk High Risk High Risk      The patient demonstrates the following risk factors for suicide: Chronic risk factors for suicide include: substance use disorder and medical illness HIV (human immunodeficiency virus infection) ([place]), AKI (acute kidney injury) ([place]) . Acute risk factors for suicide include:  Pt reports, he's suicidal with no plan or access to weapons . Protective factors for this patient include:  None . Considering these factors, the overall suicide risk at this point appears to be high. Patient is appropriate for outpatient follow up.  Michael [name] is a 71 year old male who presents voluntary and unaccompanied to [facility]. Per chart, pt was assessed at [facility] on 11/18/2021 with a similar presentation. Clinician asked the pt, "what brought you to the hospital?" Pt reports, he has a stuffy nose, scratchy throat and tightening in his chest (today is the fifth day). Pt reports, having suicidal thoughts with no plan or access to weapons. Pt reports, hurting (no specific person) has crossed his mind. Pt also reports, seeing a man with a 10 gallon hat walking across the floor. Pt reports, 5-6 months ago he attempted suicide by cutting his wrist however he used a dull knife that did not break the skin. Pt denies, current self-injurious behaviors and access to weapons.   Pt reports, smoking Crack 4-5 days ago. Pt's UDS is positive for Cocaine. Pt denies, being linked to OPT resources (medication management and/or  counseling.)   Pt presents quiet, awake with normal speech. Pt's mood, affect was depressed. Pt's insight was fair. Pt's judgement was poor. Clinician asked the pt if discharged would he hurt himself or others replied, "maybe."   Diagnosis: Cocaine abuse with cocaine-induced mood disorder ([place]).  *Pt declined for clinician to contact family and/or friends to obtain additional information. Pt denies having supports.*  Chief Complaint:  Chief Complaint  Patient presents with   Cough   Chest Pain   Visit Diagnosis:     CCA Screening, Triage and Referral (STR)  Patient Reported Information How did you hear about [place]? Self  What Is the Reason for Your Visit/Call Today? Per EDP note: "71 year old male with prior medical history as detailed below presents for evaluation. Patient's presentation today is consistent with recent evaluations at [facility]. Patient is complaining of mild cough and upper respiratory congestion. He reports that the symptoms have been ongoing for at least 4 days. He denies fever. He reports mild intermittent chest discomfort. This chest discomfort is worse with vigorous coughing. This appears to been ongoing for quite some time as well. He also endorses mild SI. He reports prior history of suicide attempts with attempted cutting of his wrist. Patient recently on psych hold at [facility] long ED for similar statements. He was released on February 12."  How Long Has This Been Causing You Problems? > than 6 months  What Do You Feel Would Help You the Most Today? Alcohol or Drug Use Treatment; Treatment for Depression or other mood problem   Have You Recently Had Any Thoughts About Hurting Yourself? Yes  Are You Planning to Commit  Suicide/Harm Yourself At This time? No   Have you Recently Had Thoughts About [place]? -- (Pt reports, hurting people (no one specific) has crossed his mind.)  Are You Planning to Harm Someone at This Time? No  Explanation: No  data recorded  Have You Used Any Alcohol or Drugs in the Past 24 Hours? Yes  How Long Ago Did You Use Drugs or Alcohol? No data recorded What Did You Use and How Much? Pt reports, he smoked about $40.00 of Crack Cocaine a couple days ago.   Do You Currently Have a Therapist/Psychiatrist? No  Name of Therapist/Psychiatrist: No data recorded  Have You Been Recently Discharged From Any Office Practice or Programs? No  Explanation of Discharge From Practice/Program: No data recorded    CCA Screening Triage Referral Assessment Type of Contact: Tele-Assessment  Telemedicine Service Delivery: Telemedicine service delivery: This service was provided via telemedicine using a 2-way, interactive audio and video technology  Is this Initial or Reassessment? Initial Assessment  Date Telepsych consult ordered in [facility]:  11/20/21  Time Telepsych consult ordered in [facility]:  1237  Location of Assessment: [facility] ED  Provider Location: [facility] Assessment Services   Collateral Involvement: Pt declined for clinician to contact family and/or friends to obtain additional information. Pt denies having supports.   Does Patient Have a Stage manager Guardian? No data recorded Name and Contact of Legal Guardian: No data recorded If Minor and Not Living with Parent(s), Who has Custody? N/A  Is CPS involved or ever been involved? Never  Is APS involved or ever been involved? Never   Patient Determined To Be At Risk for Harm To Self or Others Based on Review of Patient Reported Information or Presenting Complaint? Yes, for Self-Harm  Method: No data recorded Availability of Means: No data recorded Intent: No data recorded Notification Required: No data recorded Additional Information for Danger to Others Potential: No data recorded Additional Comments for Danger to Others Potential: No data recorded Are There Guns or Other Weapons in Your Home? No data recorded Types of Guns/Weapons: No data  recorded Are These Weapons Safely Secured?                            No data recorded Who Could Verify You Are Able To Have These Secured: No data recorded Do You Have any Outstanding Charges, Pending Court Dates, Parole/Probation? No data recorded Contacted To Inform of Risk of Harm To Self or Others: Other: Comment (No one to contact.)    Does Patient Present under Involuntary Commitment? No  IVC Papers Initial File Date: No data recorded  [place] of Residence: [place]   Patient Currently Receiving the Following Services: Not Receiving Services   Determination of Need: Urgent (48 hours)   Options For Referral: Other: Comment     CCA Biopsychosocial Patient Reported Schizophrenia/Schizoaffective Diagnosis in Past: No   Strengths: Pt reports, wanting help.   Mental Health Symptoms Depression:   Fatigue; Hopelessness; Sleep (too much or little); Worthlessness; Tearfulness; Irritability; Weight gain/loss   Duration of Depressive symptoms:    Mania:   None   Anxiety:    Worrying; Tension   Psychosis:   Hallucinations   Duration of Psychotic symptoms:    Trauma:   None   Obsessions:   None   Compulsions:   None   Inattention:   Forgetful; Loses things; Disorganized   Hyperactivity/Impulsivity:   Feeling of restlessness; Fidgets with  hands/feet   Oppositional/Defiant Behaviors:   Angry   Emotional Irregularity:   Recurrent suicidal behaviors/gestures/threats; Mood lability; Potentially harmful impulsivity   Other Mood/Personality Symptoms:   None noted    Mental Status Exam Appearance and self-care  Stature:   Average   Weight:   Average weight   Clothing:   -- (Pt in scrubs.)   Grooming:   Normal   Cosmetic use:   None   Posture/gait:   Normal   Motor activity:   Not Remarkable   Sensorium  Attention:   Normal   Concentration:   Normal   Orientation:   X5   Recall/memory:   Normal   Affect and Mood  Affect:    Depressed   Mood:   Depressed   Relating  Eye contact:   Normal   Facial expression:   Depressed   Attitude toward examiner:   Cooperative   Thought and Language  Speech flow:  Normal   Thought content:   Appropriate to Mood and Circumstances   Preoccupation:   None   Hallucinations:   None   Organization:  No data recorded  [employer] of Knowledge:   Average   Intelligence:   Average   Abstraction:   Normal   Judgement:   Poor   Reality Testing:   Adequate   Insight:   Fair   Decision Making:   Impulsive   Social Functioning  Social Maturity:   Impulsive   Social Judgement:   "Street Smart"   Stress  Stressors:   Housing; Illness   Coping Ability:   Overwhelmed; Deficient supports   Skill Deficits:   Decision making; Self-control   Supports:   Support needed     Religion: Religion/Spirituality Are You A Religious Person?: No What is Your Religious Affiliation?: Methodist  Leisure/Recreation: Leisure / Recreation Do You Have Hobbies?: Yes Leisure and Hobbies: Fishing.  Exercise/Diet: Exercise/Diet Number of Pounds Lost?:  (Pt reports, he was 180 now he's 163.) Do You Have Any Trouble Sleeping?: Yes Explanation of Sleeping Difficulties: Pt reports getting 3 hours of sleep/night. Pt reports he's homeless and sleeps outside on the streets.   CCA Employment/Education Employment/Work Situation: Employment / Work Situation Employment Situation: On disability Why is Patient on Disability: Pt reports he got injured on the job. Pt has medical illnesses. How Long has Patient Been on Disability: Pt reports he was injured on July 07, 1984. Patient's Job has Been Impacted by Current Illness: No Has Patient ever Been in the [employer]?: No  Education: Education Last Grade Completed: 12 Did [place]?: No   CCA Family/Childhood History Family and Relationship History: Family history Widowed, when?:  Pt reports his wife died two years ago. Does patient have children?: Yes How many children?: 2  Childhood History:  Childhood History By whom was/is the patient raised?: Both parents (Per chart.) Did patient suffer any verbal/emotional/physical/sexual abuse as a child?: No Did patient suffer from severe childhood neglect?: No Has patient ever been sexually abused/assaulted/raped as an adolescent or adult?: No Witnessed domestic violence?: No Has patient been affected by domestic violence as an adult?:  (NA)  Child/Adolescent Assessment:     CCA Substance Use Alcohol/Drug Use: Alcohol / Drug Use Pain Medications: See MAR Prescriptions: See MAR Over the Counter: See MAR History of alcohol / drug use?: Yes Substance #1 Name of Substance 1: Crack Cocaine. 1 - Age of First Use: UTA 1 - Amount (size/oz): Pt reports, smoking $40.00 of Crack  4-5 days ago. Pt's UDS is positive for Cocaine. 1 - Frequency: Ongoing. 1 - Duration: Ongoing. 1 - Last Use / Amount: Per pt, 4-5 days ago. 1 - Method of Aquiring: Purchase. 1- Route of Use: Smoke.     ASAM's:  Six Dimensions of Multidimensional Assessment  Dimension 1:  Acute Intoxication and/or Withdrawal Potential:      Dimension 2:  Biomedical Conditions and Complications:      Dimension 3:  Emotional, Behavioral, or Cognitive Conditions and Complications:     Dimension 4:  Readiness to Change:     Dimension 5:  Relapse, Continued use, or Continued Problem Potential:     Dimension 6:  Recovery/Living Environment:     ASAM Severity Score:    ASAM Recommended Level of Treatment:     Substance use Disorder (SUD)    Recommendations for Services/Supports/Treatments: Recommendations for Services/Supports/Treatments Recommendations For Services/Supports/Treatments: Other (Comment) (Pt to be observed and reassessed by psychiatry.)  Discharge Disposition:    DSM5 Diagnoses: Patient Active Problem List   Diagnosis Date Noted   Iron  deficiency anemia 04/04/2021   Atypical chest pain 04/03/2021   Homelessness 12/13/2019   Cellulitis 12/07/2019   Osteomyelitis ([place]) 07/17/2019   Abscess or cellulitis of toe, right    Toe pain, right-second    MDD (major depressive disorder), recurrent episode, severe ([place]) 06/15/2019   Respiratory distress 04/12/2019   Pneumonia due to severe acute respiratory syndrome coronavirus 2 (SARS-CoV-2) 04/11/2019   COVID-19 virus infection 03/19/2019   Chest pain 10/03/2018   Malnutrition of moderate degree 09/21/2018   MDD (major depressive disorder), recurrent episode, moderate ([name])    Type II diabetes mellitus with renal manifestations ([place]) 05/04/2018   GERD (gastroesophageal reflux disease) 05/04/2018   S/P CABG (coronary artery bypass graft)    Left testicular pain    Depression 02/20/2018   Epididymo-orchitis, acute 02/20/2018   Essential hypertension 02/20/2018   Precordial chest pain 02/20/2018   AKI (acute kidney injury) ([place]) 02/14/2018   HCAP (healthcare-associated pneumonia) 02/14/2018   History of pulmonary embolism 07/04/2017   Acute hyponatremia 05/11/2017   Hyperglycemia due to type 2 diabetes mellitus ([place]) 05/11/2017   CHF (congestive heart failure) ([place]) 04/01/2017   Hepatitis C 12/30/2016   Substance induced mood disorder ([place]) 11/23/2016   Chronic ischemic heart disease 11/12/2016   Paroxysmal A-fib ([place]) 11/12/2016   Back pain 04/18/2016   S/P carotid endarterectomy 11/15/2015   MDD (major depressive disorder), recurrent severe, without psychosis ([place]) 09/09/2015   Cocaine-induced mood disorder ([place]) 08/14/2015   Cocaine abuse with cocaine-induced mood disorder ([place]) 08/14/2015   Gout 07/10/2015   Acute renal failure superimposed on stage 3 chronic kidney disease ([place]) 03/06/2015   Anemia 03/06/2015   Chronic kidney disease, stage III (moderate) ([name]) 03/06/2015   Hypoglycemia    Encounter for general adult medical examination with abnormal findings  02/09/2015   Cocaine use disorder, moderate, dependence ([place]) 12/13/2014   Substance or medication-induced depressive disorder with onset during withdrawal ([place]) 12/13/2014   Severe recurrent major depressive disorder with psychotic features ([place]) 12/12/2014   Major depressive disorder, recurrent, severe without psychotic features ([place])    Cervicalgia 06/28/2014   Lumbar radiculopathy, chronic 06/28/2014   Asthma, chronic 02/03/2014   3-vessel CAD 06/24/2013   ED (erectile dysfunction) of organic origin 07/07/2012   Cocaine abuse, continuous ([place]) 05/13/2012   Hypertension goal BP (blood pressure) < 140/80 04/29/2012   Chondromalacia of left knee 03/19/2012   Hyperlipidemia with target LDL less than  100 02/12/2012   Fibromyalgia 02/12/2012   Cocaine use disorder ([place]) 01/10/2012    Class: Acute   HIV (human immunodeficiency virus infection) ([place]) 08/27/2011   Uncontrolled type 2 diabetes with neuropathy 10/17/2000     Referrals to Alternative Service(s): Referred to Alternative Service(s):   Place:   Date:   Time:    Referred to Alternative Service(s):   Place:   Date:   Time:    Referred to Alternative Service(s):   Place:   Date:   Time:    Referred to Alternative Service(s):   Place:   Date:   Time:     Vertell Novak, [facility] Comprehensive Clinical Assessment (CCA) Screening, Triage and Referral Note  11/21/2021 Marlyn Rabine [id number]  Chief Complaint:  Chief Complaint  Patient presents with   Cough   Chest Pain   Visit Diagnosis:   Patient Reported Information How did you hear about [place]? Self  What Is the Reason for Your Visit/Call Today? Per EDP note: "71 year old male with prior medical history as detailed below presents for evaluation. Patient's presentation today is consistent with recent evaluations at [facility]. Patient is complaining of mild cough and upper respiratory congestion. He reports that the symptoms have been ongoing for at least 4 days. He  denies fever. He reports mild intermittent chest discomfort. This chest discomfort is worse with vigorous coughing. This appears to been ongoing for quite some time as well. He also endorses mild SI. He reports prior history of suicide attempts with attempted cutting of his wrist. Patient recently on psych hold at [facility] long ED for similar statements. He was released on February 12."  How Long Has This Been Causing You Problems? > than 6 months  What Do You Feel Would Help You the Most Today? Alcohol or Drug Use Treatment; Treatment for Depression or other mood problem   Have You Recently Had Any Thoughts About Hurting Yourself? Yes  Are You Planning to Commit Suicide/Harm Yourself At This time? No   Have you Recently Had Thoughts About [place]? -- (Pt reports, hurting people (no one specific) has crossed his mind.)  Are You Planning to Harm Someone at This Time? No  Explanation: No data recorded  Have You Used Any Alcohol or Drugs in the Past 24 Hours? Yes  How Long Ago Did You Use Drugs or Alcohol? No data recorded What Did You Use and How Much? Pt reports, he smoked about $40.00 of Crack Cocaine a couple days ago.   Do You Currently Have a Therapist/Psychiatrist? No  Name of Therapist/Psychiatrist: No data recorded  Have You Been Recently Discharged From Any Office Practice or Programs? No  Explanation of Discharge From Practice/Program: No data recorded   CCA Screening Triage Referral Assessment Type of Contact: Tele-Assessment  Telemedicine Service Delivery: Telemedicine service delivery: This service was provided via telemedicine using a 2-way, interactive audio and video technology  Is this Initial or Reassessment? Initial Assessment  Date Telepsych consult ordered in [facility]:  11/20/21  Time Telepsych consult ordered in [facility]:  1237  Location of Assessment: [facility] ED  Provider Location: [facility] Assessment Services   Collateral Involvement: Pt declined for  clinician to contact family and/or friends to obtain additional information. Pt denies having supports.   Does Patient Have a Stage manager Guardian? No data recorded Name and Contact of Legal Guardian: No data recorded If Minor and Not Living with Parent(s), Who has Custody? N/A  Is CPS involved or ever  been involved? Never  Is APS involved or ever been involved? Never   Patient Determined To Be At Risk for Harm To Self or Others Based on Review of Patient Reported Information or Presenting Complaint? Yes, for Self-Harm  Method: No data recorded Availability of Means: No data recorded Intent: No data recorded Notification Required: No data recorded Additional Information for Danger to Others Potential: No data recorded Additional Comments for Danger to Others Potential: No data recorded Are There Guns or Other Weapons in Your Home? No data recorded Types of Guns/Weapons: No data recorded Are These Weapons Safely Secured?                            No data recorded Who Could Verify You Are Able To Have These Secured: No data recorded Do You Have any Outstanding Charges, Pending Court Dates, Parole/Probation? No data recorded Contacted To Inform of Risk of Harm To Self or Others: Other: Comment (No one to contact.)   Does Patient Present under Involuntary Commitment? No  IVC Papers Initial File Date: No data recorded  [place] of Residence: [place]   Patient Currently Receiving the Following Services: Not Receiving Services   Determination of Need: Urgent (48 hours)   Options For Referral: Other: Comment   Discharge Disposition:     Vertell Novak, [place], [place], [facility], [facility] Triage Specialist [phone]

## 2021-11-21 NOTE — ED Provider Notes (Addendum)
Emergency Medicine Observation Re-evaluation Note  Michael [name] is a 71 y.o. male, seen on rounds today.  Pt initially presented to the ED for complaints of Cough and Chest Pain Currently, the patient is in no distress, sitting upright, preparing for evaluation with our behavioral health team.  Physical Exam  BP 126/64 (BP Location: Right Arm)    Pulse 85    Temp 99.3 F (37.4 C) (Oral)    Resp 20    Ht 5\' 8"  (1.727 m)    Wt 73.9 kg    SpO2 100%    BMI 24.78 kg/m  Physical Exam General: No distress Cardiac: Regular rate and rhythm Lungs: No increased work of breathing Psych: Calm  ED Course / MDM  EKG:EKG Interpretation  Date/Time:  Tuesday November 20 2021 08:19:30 EST Ventricular Rate:  67 PR Interval:  149 QRS Duration: 84 QT Interval:  369 QTC Calculation: 390 R Axis:   70 Text Interpretation: Sinus rhythm Supraventricular bigeminy Consider left ventricular hypertrophy Confirmed by Dene Gentry [phone]) on 11/20/2021 8:42:38 AM  I have reviewed the labs performed to date as well as medications administered while in observation.  Recent changes in the last 24 hours include no notable, awaiting behavioral health evaluation.  Plan  Current plan is for behavioral health evaluation, disposition.  [name] is not under involuntary commitment.     Carmin Muskrat, MD 11/21/21 1029 11:43 AM Patient has been seen and evaluated by our behavioral health colleagues, designated is appropriate for outpatient follow-up.  Vital signs unremarkable, no fever, no tachypnea, no tachycardia, no hypotension, no hypoxia.   Carmin Muskrat, MD 11/21/21 1143

## 2021-11-21 NOTE — ED Notes (Signed)
TSS is set up and in front of pt NP is aware that we are set up

## 2021-11-21 NOTE — Consult Note (Signed)
Reached out to [place], RN caring for patient, requesting psychiatry reevaluation via TTS cart via secure chat; currently awaiting response.  Also attempted to call [phone], but unable to reach her.

## 2021-11-21 NOTE — Progress Notes (Signed)
Patient will not qualify for assisted living or independent until he address his substance use.

## 2021-11-21 NOTE — BH Assessment (Signed)
[place] Assessment Progress Note   Per Merlyn Lot, NP, this pt does not require psychiatric hospitalization at this time.  Pt is psychiatrically cleared.  Discharge instructions include referrals for area psychiatrists providing specialty care for geriatric patients.  Pt's nurse, [place], has been notified.  Jalene Mullet, [place] Triage Specialist [phone]

## 2021-11-21 NOTE — Consult Note (Signed)
Telepsych Consultation   Reason for Consult:  Psychiatric Reassessment Referring Physician:  Dr. Dene Gentry Location of Patient:    [name] Location of Provider: Other: virtual home office  Patient Identification: Michael [name] MRN:  [id number] Principal Diagnosis: Substance or medication-induced depressive disorder with onset during withdrawal [facility]) Diagnosis:  Principal Problem:   Substance or medication-induced depressive disorder with onset during withdrawal [facility]) Active Problems:   Cocaine abuse with cocaine-induced mood disorder ([place])   Homelessness   Total Time spent with patient: 30 minutes  Subjective:   Michael [name] is a 71 y.o. male patient with suicidal ideations.  Today patient reports, "This cough has kept me up all night, I didn't sleep too good. I just need to get a place to stay."  HPI:   Mr. Woelfel has a significant hx of cocaine abuse, depression and suicidal ideations, 1 previous suicide attempt; also has DM, CAD, HIV and many medical comorbidities.  Patient initially presented to ED for cough and cold related symptoms, later endorses suicidal ideations and was referred to psychiatry.  Psych evaluated him, did not feel he met criteria for inpatient admission thus recommended overnight observations for safety and AM psych reassessment.  Patient is known to our facility for similar presentation; was seen at [facility] ED 4 days prior with same concerns, cleared by medical and psychiatry and discharged.    Patient seen via telepsych by this provider; chart reviewed and consulted with Dr. Dwyane Dee on 11/21/21.  On evaluation [name] reports suicidal ideations that are passive and chronic in nature "It would be okay if I went to sleep and didn't wake up" but no plan or intent to hurt himself. He is clear and coherent, denies AVH.  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  Currently receives social security benefits because of this injury.  Historically began self medicating with cocaine as a way to escape the depressive thoughts. States he feels okay when using crack cocaine but after the effects wear off the SI returns.    He is homeless and states he does not like sleeping on the streets.  He's guarded about circumstances that led to this.  Which appears to be there reason he frequents the ER.  When asked how he can afford to buy drugs he states he saves money from his disability check to support his habit.  He is not established with outpatient psychiatry but gets antidepressant meds from his outpatient provider.  It is unclear if he takes these meds. Pt cannot recall name of PCP.  He's been given resources for outpatient mental health for medication mgmt and therapy but admits he has no desire to follow-up for services.  He has family, daughter who lives in [place], son who lives in [place] whom he stays in contact with.  Also has a younger sister whom he states he talks with weekly.  He is guarded about why he cannot live with either of them, based on hx, suspect it could be related to consequences of drug abuse.    Since admission, he has been cooperative with staff and has not required any prn medications for behavioral concerns.  He was restarted on home medications that include bupropion 24 hour tablet 182m po daily and also given trazodone 554mpo qhs prn for sleep. Patient denies medication side effects; He reports sleep  and appetite disturbance related to upper respiratory symptoms. Otherwise he is medically cleared.    Past Psychiatric History: as outlined below  Risk to Self:  no Risk to Others:  no Prior Inpatient Therapy: yes  Prior Outpatient Therapy:  unknown  Past Medical History:  Past Medical History:  Diagnosis Date   A-fib ([place])    Asthma    No PFTs, history of childhood asthma   CAD  (coronary artery disease)    a. 06/2013 STEMI/PCI ([facility]): LAD w/ thrombus (treated with BMS), mid 75%, D2 75%; LCX OM2 75%; RCA small, PDA 95%, PLV 95%;  b. 10/2013 Cath/PCI: ISR w/in LAD (Promus DES x 2), borderline OM2 lesion;  c. 01/2014 MV: Intermediate risk, medium-sized distal ant wall infarct w/ very small amt of peri-infarct ischemia. EF 60%.   Cellulitis 04/2014   left facial   Cellulitis and abscess of toe of right foot 12/08/2019   Chondromalacia of medial femoral condyle    Left knee MRI 04/28/12: Chondromalacia of the medial femoral condyle with slight peripheral degeneration of the meniscocapsular junction of the medial meniscus; followed by sports medicine   Collagen vascular disease ([place])    Crack cocaine use    for 20+ years, has been enrolled in detox programs in the past   Depression    with history of hospitalization for suicidal ideation   Diabetes mellitus 2002   Diagnosed in 2002, started insulin in 2012   Gout    Gout 04/28/2012   Headache(784.0)    CT head 08/2011: Periventricular and subcortical white matter hypodensities are most in keeping with chronic microangiopathic change   HIV infection [facility]) Nov 2012   Followed by [name]   Hyperlipidemia    Hypertension    Pulmonary embolism [facility])     Past Surgical History:  Procedure Laterality Date   AMPUTATION Right 07/21/2019   Procedure: RIGHT SECOND TOE AMPUTATION;  Surgeon: [name], MD;  Location: [place];  Service: Orthopedics;  Laterality: Right;   BACK SURGERY     1988   BOWEL RESECTION     CARDIAC SURGERY     CERVICAL SPINE SURGERY     " rods in my neck "   CORONARY ARTERY BYPASS GRAFT     CORONARY STENT PLACEMENT     NM MYOCAR PERF WALL MOTION  12/27/2011   normal   SPINE SURGERY     Family History:  Family History  Problem Relation Age of Onset   Diabetes Mother    Hypertension Mother    Hyperlipidemia Mother    Diabetes Father    Cancer Father    Hypertension Father    Diabetes Brother     Heart disease Brother    Diabetes Sister    Colon cancer Neg Hx    Family Psychiatric  History: unknown Social History:  Social History   Substance and Sexual Activity  Alcohol Use No   Alcohol/week: 4.0 standard drinks   Types: 2 Cans of beer, 2 Shots of liquor per week     Social History   Substance and Sexual Activity  Drug Use Not Currently   Frequency: 4.0 times per week   Types: "Crack" cocaine, Cocaine   Comment: Recent use of crack    Social History   Socioeconomic History   Marital status: Widowed    Spouse name: Not on file   Number of children: 2   Years of education: 59   Highest education level: 12th grade  Occupational  History    Employer: UNEMPLOYED    Comment: 04/2016  Tobacco Use   Smoking status: Never   Smokeless tobacco: Never  Vaping Use   Vaping Use: Never used  Substance and Sexual Activity   Alcohol use: No    Alcohol/week: 4.0 standard drinks    Types: 2 Cans of beer, 2 Shots of liquor per week   Drug use: Not Currently    Frequency: 4.0 times per week    Types: "Crack" cocaine, Cocaine    Comment: Recent use of crack   Sexual activity: Yes    Comment: DECLINED CONDOMS  Other Topics Concern   Not on file  Social History Narrative   Currently staying with a friend in [place].  Was staying @ local motel until a few days ago - left b/c of bed bugs.   Picked up from [facility].  Not followed by a psychiatrist.   Social Determinants of Health   Financial Resource Strain: Not on file  Food Insecurity: Not on file  Transportation Needs: Not on file  Physical Activity: Not on file  Stress: Not on file  Social Connections: Not on file   Additional Social History:    Allergies:  No Known Allergies  Labs:  Results for orders placed or performed during the hospital encounter of 11/20/21 (from the past 48 hour(s))  Acetaminophen level     Status: Abnormal   Collection Time: 11/20/21  8:44 AM  Result Value Ref Range   Acetaminophen  (Tylenol), Serum <10 (L) 10 - 30 ug/mL    Comment: (NOTE) Therapeutic concentrations vary significantly. A range of 10-30 ug/mL  may be an effective concentration for many patients. However, some  are best treated at concentrations outside of this range. Acetaminophen concentrations >150 ug/mL at 4 hours after ingestion  and >50 ug/mL at 12 hours after ingestion are often associated with  toxic reactions.  Performed at [facility], [place] [address] [place], [place] [address]   Salicylate level     Status: Abnormal   Collection Time: 11/20/21  8:44 AM  Result Value Ref Range   Salicylate Lvl <7.3 (L) 7.0 - 30.0 mg/dL    Comment: Performed at [facility] [address], [place], [place] [address]  CBC with Differential     Status: Abnormal   Collection Time: 11/20/21  8:44 AM  Result Value Ref Range   WBC 4.5 4.0 - 10.5 K/uL   RBC 3.36 (L) 4.22 - 5.81 MIL/uL   Hemoglobin 9.3 (L) 13.0 - 17.0 g/dL   HCT 28.7 (L) 39.0 - 52.0 %   MCV 85.4 80.0 - 100.0 fL   MCH 27.7 26.0 - 34.0 pg   MCHC 32.4 30.0 - 36.0 g/dL   RDW 12.9 11.5 - 15.5 %   Platelets 132 (L) 150 - 400 K/uL   nRBC 0.0 0.0 - 0.2 %   Neutrophils Relative % 67 %   Neutro Abs 3.0 1.7 - 7.7 K/uL   Lymphocytes Relative 21 %   Lymphs Abs 0.9 0.7 - 4.0 K/uL   Monocytes Relative 8 %   Monocytes Absolute 0.4 0.1 - 1.0 K/uL   Eosinophils Relative 4 %   Eosinophils Absolute 0.2 0.0 - 0.5 K/uL   Basophils Relative 0 %   Basophils Absolute 0.0 0.0 - 0.1 K/uL   Immature Granulocytes 0 %   Abs Immature Granulocytes 0.02 0.00 - 0.07 K/uL    Comment: Performed at [facility], [address] [place], [place] [address]  Protime-INR     Status: None   Collection Time: 11/20/21  8:44 AM  Result Value Ref Range   Prothrombin Time 12.0 11.4 - 15.2 seconds   INR 0.9 0.8 - 1.2    Comment: (NOTE) INR goal varies based on device and disease states. Performed at [facility], [place] [address], [place],  [place] [address]   Troponin I (High Sensitivity)     Status: Abnormal   Collection Time: 11/20/21  8:44 AM  Result Value Ref Range   Troponin I (High Sensitivity) 19 (H) <18 ng/L    Comment: (NOTE) Elevated high sensitivity troponin I (hsTnI) values and significant  changes across serial measurements may suggest ACS but many other  chronic and acute conditions are known to elevate hsTnI results.  Refer to the "Links" section for chest pain algorithms and additional  guidance. Performed at [facility], [place] [address], [place], [place] [address]   Comprehensive metabolic panel     Status: Abnormal   Collection Time: 11/20/21  8:44 AM  Result Value Ref Range   Sodium 136 135 - 145 mmol/L   Potassium 3.9 3.5 - 5.1 mmol/L   Chloride 108 98 - 111 mmol/L   CO2 20 (L) 22 - 32 mmol/L   Glucose, Bld 204 (H) 70 - 99 mg/dL    Comment: Glucose reference range applies only to samples taken after fasting for at least 8 hours.   BUN 39 (H) 8 - 23 mg/dL   Creatinine, Ser 2.43 (H) 0.61 - 1.24 mg/dL   Calcium 8.6 (L) 8.9 - 10.3 mg/dL   Total Protein 5.9 (L) 6.5 - 8.1 g/dL   Albumin 2.9 (L) 3.5 - 5.0 g/dL   AST 17 15 - 41 U/L   ALT 16 0 - 44 U/L   Alkaline Phosphatase 65 38 - 126 U/L   Total Bilirubin 0.3 0.3 - 1.2 mg/dL   GFR, Estimated 28 (L) >60 mL/min    Comment: (NOTE) Calculated using the CKD-EPI Creatinine Equation (2021)    Anion gap 8 5 - 15    Comment: Performed at [facility], [place] [address], [place], [place] [address]  Resp Panel by RT-PCR (Flu A&B, Covid) Nasopharyngeal Swab     Status: None   Collection Time: 11/20/21  8:44 AM   Specimen: Nasopharyngeal Swab; Nasopharyngeal(NP) swabs in vial transport medium  Result Value Ref Range   SARS Coronavirus 2 by RT PCR NEGATIVE NEGATIVE    Comment: (NOTE) SARS-CoV-2 target nucleic acids are NOT DETECTED.  The SARS-CoV-2 RNA is generally detectable in upper respiratory specimens during the acute phase of infection. The  lowest concentration of SARS-CoV-2 viral copies this assay can detect is 138 copies/mL. A negative result does not preclude SARS-Cov-2 infection and should not be used as the sole basis for treatment or other patient management decisions. A negative result may occur with  improper specimen collection/handling, submission of specimen other than nasopharyngeal swab, presence of viral mutation(s) within the areas targeted by this assay, and inadequate number of viral copies(<138 copies/mL). A negative result must be combined with clinical observations, patient history, and epidemiological information. The expected result is Negative.  Fact Sheet for Patients:  EntrepreneurPulse.com.au  Fact Sheet for Healthcare Providers:  IncredibleEmployment.be  This test is no t yet approved or cleared by the [place] FDA and  has been authorized for detection and/or diagnosis of SARS-CoV-2 by FDA under an Emergency  Use Authorization (EUA). This EUA will remain  in effect (meaning this test can be used) for the duration of the COVID-19 declaration under Section 564(b)(1) of the Act, 21 U.S.C.section 360bbb-3(b)(1), unless the authorization is terminated  or revoked sooner.       Influenza A by PCR NEGATIVE NEGATIVE   Influenza B by PCR NEGATIVE NEGATIVE    Comment: (NOTE) The Xpert Xpress SARS-CoV-2/FLU/RSV plus assay is intended as an aid in the diagnosis of influenza from Nasopharyngeal swab specimens and should not be used as a sole basis for treatment. Nasal washings and aspirates are unacceptable for Xpert Xpress SARS-CoV-2/FLU/RSV testing.  Fact Sheet for Patients: EntrepreneurPulse.com.au  Fact Sheet for Healthcare Providers: IncredibleEmployment.be  This test is not yet approved or cleared by the [place] FDA and has been authorized for detection and/or diagnosis of SARS-CoV-2 by FDA under an Emergency  Use Authorization (EUA). This EUA will remain in effect (meaning this test can be used) for the duration of the COVID-19 declaration under Section 564(b)(1) of the Act, 21 U.S.C. section 360bbb-3(b)(1), unless the authorization is terminated or revoked.  Performed at [facility], [place] [address], [place], [place] [address]   Ethanol     Status: None   Collection Time: 11/20/21  8:44 AM  Result Value Ref Range   Alcohol, Ethyl (B) <10 <10 mg/dL    Comment: (NOTE) Lowest detectable limit for serum alcohol is 10 mg/dL.  For medical purposes only. Performed at [facility], [place] [address], [place], [place] [address]   Urinalysis, Routine w reflex microscopic Urine, Clean Catch     Status: Abnormal   Collection Time: 11/20/21  8:47 AM  Result Value Ref Range   Color, Urine YELLOW YELLOW   APPearance CLEAR CLEAR   Specific Gravity, Urine 1.013 1.005 - 1.030   pH 5.0 5.0 - 8.0   Glucose, UA 50 (A) NEGATIVE mg/dL   Hgb urine dipstick SMALL (A) NEGATIVE   Bilirubin Urine NEGATIVE NEGATIVE   Ketones, ur NEGATIVE NEGATIVE mg/dL   Protein, ur 100 (A) NEGATIVE mg/dL   Nitrite NEGATIVE NEGATIVE   Leukocytes,Ua NEGATIVE NEGATIVE   RBC / HPF 0-5 0 - 5 RBC/hpf   WBC, UA 0-5 0 - 5 WBC/hpf   Bacteria, UA RARE (A) NONE SEEN    Comment: Performed at [facility], [place] [address] [place],  [address]  Urine rapid drug screen (hosp performed)     Status: Abnormal   Collection Time: 11/20/21  8:47 AM  Result Value Ref Range   Opiates NONE DETECTED NONE DETECTED   Cocaine POSITIVE (A) NONE DETECTED   Benzodiazepines NONE DETECTED NONE DETECTED   Amphetamines NONE DETECTED NONE DETECTED   Tetrahydrocannabinol NONE DETECTED NONE DETECTED   Barbiturates NONE DETECTED NONE DETECTED    Comment: (NOTE) DRUG SCREEN FOR MEDICAL PURPOSES ONLY.  IF CONFIRMATION IS NEEDED FOR ANY PURPOSE, NOTIFY LAB WITHIN 5 DAYS.  LOWEST DETECTABLE LIMITS FOR URINE DRUG SCREEN Drug Class                      Cutoff (ng/mL) Amphetamine and metabolites    1000 Barbiturate and metabolites    200 Benzodiazepine                 779 Tricyclics and metabolites     300 Opiates and metabolites        300 Cocaine and metabolites        300  THC                            50 Performed at [facility], [place] [address], [place], [place] [address]   Troponin I (High Sensitivity)     Status: Abnormal   Collection Time: 11/20/21 11:25 AM  Result Value Ref Range   Troponin I (High Sensitivity) 20 (H) <18 ng/L    Comment: (NOTE) Elevated high sensitivity troponin I (hsTnI) values and significant  changes across serial measurements may suggest ACS but many other  chronic and acute conditions are known to elevate hsTnI results.  Refer to the "Links" section for chest pain algorithms and additional  guidance. Performed at [facility], [place] [address] [place],  [address]   CBG monitoring, ED     Status: Abnormal   Collection Time: 11/20/21  4:40 PM  Result Value Ref Range   Glucose-Capillary 282 (H) 70 - 99 mg/dL    Comment: Glucose reference range applies only to samples taken after fasting for at least 8 hours.  CBG monitoring, ED     Status: None   Collection Time: 11/21/21  7:58 AM  Result Value Ref Range   Glucose-Capillary 87 70 - 99 mg/dL    Comment: Glucose reference range applies only to samples taken after fasting for at least 8 hours.    Medications:  Current Facility-Administered Medications  Medication Dose Route Frequency Provider Last Rate Last Admin   albuterol (VENTOLIN HFA) 108 (90 Base) MCG/ACT inhaler 1-2 puff  1-2 puff Inhalation Q6H PRN Valarie Merino, MD       amLODipine (NORVASC) tablet 10 mg  10 mg Oral Daily Valarie Merino, MD   10 mg at 11/21/21 6010   aspirin EC tablet 81 mg  81 mg Oral Daily Valarie Merino, MD   81 mg at 11/21/21 0943   bictegravir-emtricitabine-tenofovir AF (BIKTARVY) 50-200-25 MG per tablet 1 tablet  1 tablet  Oral Daily Valarie Merino, MD   1 tablet at 11/21/21 0943   buPROPion (WELLBUTRIN XL) 24 hr tablet 150 mg  150 mg Oral q morning Valarie Merino, MD       hydrALAZINE (APRESOLINE) tablet 75 mg  75 mg Oral TID Valarie Merino, MD   75 mg at 11/21/21 0944   insulin aspart (novoLOG) injection 4 Units  4 Units Subcutaneous TID WC Pauletta Browns, RPH       insulin glargine-yfgn [facility]) injection 30 Units  30 Units Subcutaneous Daily Valarie Merino, MD   30 Units at 11/21/21 0945   isosorbide mononitrate (IMDUR) 24 hr tablet 120 mg  120 mg Oral Daily Valarie Merino, MD   120 mg at 11/21/21 0946   lidocaine (LIDODERM) 5 % 1 patch  1 patch Transdermal Daily PRN Valarie Merino, MD       mometasone-formoterol (DULERA) 200-5 MCG/ACT inhaler 2 puff  2 puff Inhalation BID Valarie Merino, MD       nitroGLYCERIN (NITROSTAT) SL tablet 0.4 mg  0.4 mg Sublingual Q5 min PRN Valarie Merino, MD       pantoprazole (PROTONIX) EC tablet 40 mg  40 mg Oral Daily PRN Valarie Merino, MD       traZODone (DESYREL) tablet 50 mg  50 mg Oral QHS PRN Valarie Merino, MD   50 mg at 11/20/21 2133   Current Outpatient Medications  Medication Sig Dispense  Refill   albuterol (VENTOLIN HFA) 108 (90 Base) MCG/ACT inhaler Inhale 1-2 puffs into the lungs every 6 (six) hours as needed for wheezing or shortness of breath. 8.5 g 1   amLODipine (NORVASC) 10 MG tablet Take 1 tablet (10 mg total) by mouth daily. 60 tablet 2   aspirin EC 81 MG tablet Take 81 mg by mouth daily. Swallow whole.     bictegravir-emtricitabine-tenofovir AF (BIKTARVY) 50-200-25 MG TABS tablet Take 1 tablet by mouth daily. 30 tablet 11   budesonide-formoterol (SYMBICORT) 160-4.5 MCG/ACT inhaler Inhale 2 puffs into the lungs in the morning and at bedtime. 10.2 g 2   buPROPion (WELLBUTRIN XL) 150 MG 24 hr tablet Take 1 tablet (150 mg total) by mouth every morning. 30 tablet 3   hydrALAZINE (APRESOLINE) 25 MG tablet Take 3 tablets (75 mg total) by  mouth 3 (three) times daily. 270 tablet 1   insulin glargine (LANTUS SOLOSTAR) 100 UNIT/ML Solostar Pen Inject 30 Units into the skin daily. 6 mL 3   insulin lispro (HUMALOG KWIKPEN) 100 UNIT/ML KwikPen Inject 4 Units into the skin 3 (three) times daily with meals. 15 mL 0   isosorbide mononitrate (IMDUR) 60 MG 24 hr tablet Take 2 tablets (120 mg total) by mouth daily. 60 tablet 2   lidocaine (LIDODERM) 5 % Place 1 patch onto the skin daily as needed (For chestwall pain). Remove & Discard patch within 12 hours or as directed by MD 30 patch 0   nitroGLYCERIN (NITROSTAT) 0.4 MG SL tablet Place 1 tablet (0.4 mg total) under the tongue every 5 (five) minutes as needed for chest pain. 25 tablet 0   pantoprazole (PROTONIX) 40 MG tablet Take 1 tablet (40 mg total) by mouth daily as needed (for heartburn). 30 tablet 2   traZODone (DESYREL) 50 MG tablet Take 1 tablet (50 mg total) by mouth at bedtime as needed for sleep. 30 tablet 3   Insulin Pen Needle 31G X 5 MM MISC Use with insulin pens (Patient taking differently: 1 each by Other route See admin instructions. Use with insulin pens) 100 each 1    Musculoskeletal: Strength & Muscle Tone: within normal limits Gait & Station:  per pt, ambulates with a cane Patient leans: N/A   Psychiatric Specialty Exam:  Presentation  General Appearance: Casual  Eye Contact:Good  Speech:Clear and Coherent; Normal Rate  Speech Volume:Decreased  Handedness:Right   Mood and Affect  Mood:Dysphoric (related to upper respiratory symptoms)  Affect:Appropriate; Congruent   Thought Process  Thought Processes:Goal Directed; Coherent  Descriptions of Associations:Intact  Orientation:Full (Time, Place and Person)  Thought Content:Logical (has improved since admission and no waning effects of cocaine)  History of Schizophrenia/Schizoaffective disorder:No  Duration of Psychotic Symptoms:N/A  Hallucinations:Hallucinations: None  Ideas of  Reference:None  Suicidal Thoughts:Suicidal Thoughts: Yes, Passive SI Passive Intent and/or Plan: Without Intent; Without Plan  Homicidal Thoughts:Homicidal Thoughts: No   Sensorium  Memory:Immediate Good; Recent Good; Remote Good  Judgment:Fair (at baseline in the setting of chronic cocaine usage)  Insight:Fair   Executive Functions  Concentration:Good  Attention Span:Good  [place] of Knowledge:Good  Language:Good   Psychomotor Activity  Psychomotor Activity:Psychomotor Activity: Normal   Assets  Assets:Communication Skills; Desire for Improvement   Sleep  Sleep:Sleep: Fair Number of Hours of Sleep: 5    Physical Exam: Physical Exam Constitutional:      Appearance: He is well-developed.  Neurological:     General: No focal deficit present.     Mental Status: He  is alert and oriented to person, place, and time.  Psychiatric:        Attention and Perception: Attention and perception normal.        Mood and Affect: Mood normal.        Speech: Speech normal.        Behavior: Behavior normal. Behavior is cooperative.        Thought Content: Thought content is not paranoid or delusional. Thought content includes suicidal (passive suicidal ideations but no plan or intent) ideation. Thought content does not include homicidal ideation. Thought content does not include homicidal or suicidal plan.        Cognition and Memory: Cognition and memory normal.        Judgment: Judgment is impulsive (impulsive with cocaine use).   Review of Systems  Constitutional: Negative.   HENT: Negative.    Eyes: Negative.   Respiratory:  Positive for cough. Negative for shortness of breath.   Cardiovascular: Negative.   Gastrointestinal: Negative.   Genitourinary: Negative.   Musculoskeletal:  Positive for back pain (chronic).  Neurological: Negative.   Endo/Heme/Allergies: Negative.   Psychiatric/Behavioral:  Positive for depression, substance abuse and suicidal  ideas (no plan or intent). Negative for hallucinations.   Blood pressure 126/64, pulse 85, temperature 99.3 F (37.4 C), temperature source Oral, resp. rate 20, height '5\' 8"'  (1.727 m), weight 73.9 kg, SpO2 100 %. Body mass index is 24.78 kg/m.  Treatment Plan Summary: Patient with chronic suicidal ideations, no plan or intent for self harm. He is homeless and his frequent visits to the emergency department is suspicious for secondary gain.  Plan- As per above assessment, there are no current grounds for involuntary commitment at this time.  -SW to provide resources for outpatient psychiatry and therapy. -TOC consult entered for potential resources for assistance living or other resources for pt with chronic medical comorbidities and homelessness. -He should resume outpatient medications  Above discussed with patient concordance.   Patient is not currently interested in inpatient services but expresses agreement to continue outpatient treatment., we have reviewed importance of substance abuse abstinence, potential negative impact substance abuse can have on his relationships and level of functioning, and importance of medication compliance.  Disposition: No evidence of imminent risk to self or others at present.   Patient does not meet criteria for psychiatric inpatient admission. Supportive therapy provided about ongoing stressors. Discussed crisis plan, support from social network, calling 911, coming to the Emergency Department, and calling Suicide Hotline.  This service was provided via telemedicine using a 2-way, interactive audio and video technology.  Names of all persons participating in this telemedicine service and their role in this encounter. Name: [name] Role: Patient  Name: Merlyn Lot Role: PMHNP    Mallie Darting, NP 11/21/2021 10:50 AM

## 2021-11-29 ENCOUNTER — Ambulatory Visit: Payer: Medicare Other | Admitting: Critical Care Medicine

## 2021-11-29 DIAGNOSIS — R2689 Other abnormalities of gait and mobility: Secondary | ICD-10-CM | POA: Insufficient documentation

## 2021-11-29 DIAGNOSIS — Z9289 Personal history of other medical treatment: Secondary | ICD-10-CM | POA: Insufficient documentation

## 2021-11-29 DIAGNOSIS — I420 Dilated cardiomyopathy: Secondary | ICD-10-CM | POA: Insufficient documentation

## 2021-11-29 DIAGNOSIS — Z7409 Other reduced mobility: Secondary | ICD-10-CM | POA: Insufficient documentation

## 2021-11-29 DIAGNOSIS — M1990 Unspecified osteoarthritis, unspecified site: Secondary | ICD-10-CM | POA: Insufficient documentation

## 2021-11-29 NOTE — Progress Notes (Unsigned)
Established Patient Office Visit  Subjective:  Patient ID: Michael [name], male    DOB: Aug 30, 1951  Age: 71 y.o. MRN: [id number]  CC: No chief complaint on file.   HPI [name] presents for *** Seen [facility] 10/24/21 Pt seen by Dr Joya Gaskins.   He has been having chest pain since his CABG 2019 with LIMA-LAD, SVG-OM2, SVG-PDA, with bilat pulm vein isolation and LAA clip.    Multiple ER visits for chest pain, suicidal ideation.   He had an ulcer on his toe, R great  partly amputated and 2nd toe removed.  Since then he has had problems w/ balance. All this was done in [place]. Needs to go to the [facility] >> referral made.    He has a PCP in [place], but has not seen her recently. He only saw her once. F/u with Dr Joya Gaskins    He has been having sharp chest pain since the surgery. There are some tender areas on his chest on either side of the sternum. No point tenderness. He was on Voltaren gel and lidocaine patches, thinks the patches helped more   He needs to see the foot doctor, has a sore on his foot >. Referral made   During a recent ER visit, he was +cocaine, trop 71,but came down.    172/92, 76, 96% O2, is wheezing, will get an inhaler.   He was on Xarelto at one point for PAF. F/u with Dr Sallyanne Kuster >> referral made.    He has been out of all of his meds for a while, including HIV meds. HIV meds >> restarted ID referral made  CARDIAC CMarked catheter dampening with engagement of LAD - small / short LM - LCX with obstructive OM1, native LCX ok. LAD with marked dampening and ST dep in ostial LAD - right at stent - 70% with diffuse 30% ISR.   RCA with serial high grade lesions mid/distal RCA with R-L collaterals to LAD.  ATH: 01/22/2018   Meds restarted: ASA 81 mg , amlodipine 10 mg qd, alb MDI, Biktarvy, isosorbide bid, glargine ins 30 u qd, lispro 4 u tid, hydralazine tid, lidocaine patch, pantoprazole, trazodone qhs    Multiple ED visits for CP and SI  Pcv 20,  a1c foot    Past Medical History:  Diagnosis Date   A-fib ([place])    Asthma    No PFTs, history of childhood asthma   CAD (coronary artery disease)    a. 06/2013 STEMI/PCI ([facility]): LAD w/ thrombus (treated with BMS), mid 75%, D2 75%; LCX OM2 75%; RCA small, PDA 95%, PLV 95%;  b. 10/2013 Cath/PCI: ISR w/in LAD (Promus DES x 2), borderline OM2 lesion;  c. 01/2014 MV: Intermediate risk, medium-sized distal ant wall infarct w/ very small amt of peri-infarct ischemia. EF 60%.   Cellulitis 04/2014   left facial   Cellulitis and abscess of toe of right foot 12/08/2019   Chondromalacia of medial femoral condyle    Left knee MRI 04/28/12: Chondromalacia of the medial femoral condyle with slight peripheral degeneration of the meniscocapsular junction of the medial meniscus; followed by sports medicine   Collagen vascular disease ([place])    Crack cocaine use    for 20+ years, has been enrolled in detox programs in the past   Depression    with history of hospitalization for suicidal ideation   Diabetes mellitus 2002   Diagnosed in 2002, started insulin in 2012   Gout    Gout  04/28/2012   Headache(784.0)    CT head 08/2011: Periventricular and subcortical white matter hypodensities are most in keeping with chronic microangiopathic change   HIV infection [facility]) Nov 2012   Followed by [name]   Hyperlipidemia    Hypertension    Pulmonary embolism [facility])     Past Surgical History:  Procedure Laterality Date   AMPUTATION Right 07/21/2019   Procedure: RIGHT SECOND TOE AMPUTATION;  Surgeon: [name], MD;  Location: [place];  Service: Orthopedics;  Laterality: Right;   BACK SURGERY     1988   BOWEL RESECTION     CARDIAC SURGERY     CERVICAL SPINE SURGERY     " rods in my neck "   CORONARY ARTERY BYPASS GRAFT     CORONARY STENT PLACEMENT     NM MYOCAR PERF WALL MOTION  12/27/2011   normal   SPINE SURGERY      Family History  Problem Relation Age of Onset    Diabetes Mother    Hypertension Mother    Hyperlipidemia Mother    Diabetes Father    Cancer Father    Hypertension Father    Diabetes Brother    Heart disease Brother    Diabetes Sister    Colon cancer Neg Hx     Social History   Socioeconomic History   Marital status: Widowed    Spouse name: Not on file   Number of children: 2   Years of education: 21   Highest education level: 12th grade  Occupational History    Employer: UNEMPLOYED    Comment: 04/2016  Tobacco Use   Smoking status: Never   Smokeless tobacco: Never  Vaping Use   Vaping Use: Never used  Substance and Sexual Activity   Alcohol use: No    Alcohol/week: 4.0 standard drinks    Types: 2 Cans of beer, 2 Shots of liquor per week   Drug use: Not Currently    Frequency: 4.0 times per week    Types: "Crack" cocaine, Cocaine    Comment: Recent use of crack   Sexual activity: Yes    Comment: DECLINED CONDOMS  Other Topics Concern   Not on file  Social History Narrative   Currently staying with a friend in [place].  Was staying @ local motel until a few days ago - left b/c of bed bugs.   P[facility].  Not followed by a psychiatrist.   Social Determinants of Health   Financial Resource Strain: Not on file  Food Insecurity: Not on file  Transportation Needs: Not on file  Physical Activity: Not on file  Stress: Not on file  Social Connections: Not on file  Intimate Partner Violence: Not on file    Outpatient Medications Prior to Visit  Medication Sig Dispense Refill   albuterol (VENTOLIN HFA) 108 (90 Base) MCG/ACT inhaler Inhale 1-2 puffs into the lungs every 6 (six) hours as needed for wheezing or shortness of breath. 8.5 g 1   amLODipine (NORVASC) 10 MG tablet Take 1 tablet (10 mg total) by mouth daily. 60 tablet 2   aspirin EC 81 MG tablet Take 81 mg by mouth daily. Swallow whole.     bictegravir-emtricitabine-tenofovir AF (BIKTARVY) 50-200-25 MG TABS tablet Take 1  tablet by mouth daily. 30 tablet 11   budesonide-formoterol (SYMBICORT) 160-4.5 MCG/ACT inhaler Inhale 2 puffs into the lungs in the morning and at bedtime. 10.2 g 2   buPROPion (WELLBUTRIN XL) 150 MG  24 hr tablet Take 1 tablet (150 mg total) by mouth every morning. 30 tablet 3   hydrALAZINE (APRESOLINE) 25 MG tablet Take 3 tablets (75 mg total) by mouth 3 (three) times daily. 270 tablet 1   insulin glargine (LANTUS SOLOSTAR) 100 UNIT/ML Solostar Pen Inject 30 Units into the skin daily. 6 mL 3   insulin lispro (HUMALOG KWIKPEN) 100 UNIT/ML KwikPen Inject 4 Units into the skin 3 (three) times daily with meals. 15 mL 0   Insulin Pen Needle 31G X 5 MM MISC Use with insulin pens (Patient taking differently: 1 each by Other route See admin instructions. Use with insulin pens) 100 each 1   isosorbide mononitrate (IMDUR) 60 MG 24 hr tablet Take 2 tablets (120 mg total) by mouth daily. 60 tablet 2   lidocaine (LIDODERM) 5 % Place 1 patch onto the skin daily as needed (For chestwall pain). Remove & Discard patch within 12 hours or as directed by MD 30 patch 0   nitroGLYCERIN (NITROSTAT) 0.4 MG SL tablet Place 1 tablet (0.4 mg total) under the tongue every 5 (five) minutes as needed for chest pain. 25 tablet 0   pantoprazole (PROTONIX) 40 MG tablet Take 1 tablet (40 mg total) by mouth daily as needed (for heartburn). 30 tablet 2   traZODone (DESYREL) 50 MG tablet Take 1 tablet (50 mg total) by mouth at bedtime as needed for sleep. 30 tablet 3   No facility-administered medications prior to visit.    No Known Allergies  ROS Review of Systems    Objective:    Physical Exam  There were no vitals taken for this visit. Wt Readings from Last 3 Encounters:  11/20/21 163 lb (73.9 kg)  11/17/21 163 lb 2.3 oz (74 kg)  10/14/21 163 lb (73.9 kg)     Health Maintenance Due  Topic Date Due   Zoster Vaccines- Shingrix (1 of 2) Never done   FOOT EXAM  02/08/2016   OPHTHALMOLOGY EXAM   04/17/2017   COVID-19 Vaccine (3 - Pfizer risk series) 10/13/2020   Pneumonia Vaccine 50+ Years old (4 - PPSV23 if available, else PCV20) 01/21/2021   INFLUENZA VACCINE  05/07/2021   HEMOGLOBIN A1C  07/26/2021    There are no preventive care reminders to display for this patient.  Lab Results  Component Value Date   TSH 1.668 06/16/2019   Lab Results  Component Value Date   WBC 4.5 11/20/2021   HGB 9.3 (L) 11/20/2021   HCT 28.7 (L) 11/20/2021   MCV 85.4 11/20/2021   PLT 132 (L) 11/20/2021   Lab Results  Component Value Date   NA 136 11/20/2021   K 3.9 11/20/2021   CO2 20 (L) 11/20/2021   GLUCOSE 204 (H) 11/20/2021   BUN 39 (H) 11/20/2021   CREATININE 2.43 (H) 11/20/2021   BILITOT 0.3 11/20/2021   ALKPHOS 65 11/20/2021   AST 17 11/20/2021   ALT 16 11/20/2021   PROT 5.9 (L) 11/20/2021   ALBUMIN 2.9 (L) 11/20/2021   CALCIUM 8.6 (L) 11/20/2021   ANIONGAP 8 11/20/2021   EGFR 36 (L) 04/25/2021   Lab Results  Component Value Date   CHOL 149 04/25/2021   Lab Results  Component Value Date   HDL 57 04/25/2021   Lab Results  Component Value Date   LDLCALC 68 04/25/2021   Lab Results  Component Value Date   TRIG 166 (H) 04/25/2021   Lab Results  Component Value Date   CHOLHDL 2.6 04/25/2021  Lab Results  Component Value Date   HGBA1C 8.5 (H) 04/25/2021      Assessment & Plan:   Problem List Items Addressed This Visit   None   No orders of the defined types were placed in this encounter.   Follow-up: No follow-ups on file.    Asencion Noble, MD

## 2021-12-09 DIAGNOSIS — I160 Hypertensive urgency: Secondary | ICD-10-CM | POA: Insufficient documentation

## 2022-03-27 ENCOUNTER — Encounter: Payer: Self-pay | Admitting: Infectious Diseases

## 2022-04-16 ENCOUNTER — Ambulatory Visit: Payer: Self-pay | Admitting: *Deleted

## 2022-04-16 NOTE — Patient Outreach (Signed)
  Care Coordination TOC Note Transition Care Management Unsuccessful Follow-up Telephone Call  Date of discharge and from where:  04/13/22 FROM [facility]  Attempts:  1st Attempt  Reason for unsuccessful TCM follow-up call:  No answer/busy  Hubert Azure RN, MSN RN Care Management Coordinator  [place] [phone] [email or web address]

## 2022-04-17 ENCOUNTER — Other Ambulatory Visit: Payer: Self-pay | Admitting: *Deleted

## 2022-04-17 NOTE — Patient Outreach (Signed)
  Care Coordination [facility] Note Transition Care Management Unsuccessful Follow-up Telephone Call  Date of discharge and from where:  04/13/22 [facility]  Attempts:  2nd Attempt  Reason for unsuccessful TCM follow-up call:  Unable to leave message.  VM stating your call cannot be completed at this time. Please call back and try again later.  Emelia Loron RN, BSN [place] [phone] [email or web address] [email or web address]

## 2022-04-18 ENCOUNTER — Other Ambulatory Visit: Payer: Self-pay | Admitting: *Deleted

## 2022-04-18 NOTE — Patient Outreach (Signed)
  Care Coordination [facility] Note Transition Care Management Unsuccessful Follow-up Telephone Call  Date of discharge and from where:  04/13/22 [facility]  Attempts:  3rd Attempt  Reason for unsuccessful TCM follow-up call:  Unable to reach patient. VM stating your call cannot be completed at this time. Please hang up  and try your call later.  Emelia Loron RN, BSN [name] [phone] Jaquay [email or web address]

## 2022-05-13 IMAGING — DX DG CHEST 2V
2 series · 2 of 2 positions shown · non-contrast
Comparison: Portable chest 12/12/2019 and earlier.

CLINICAL DATA: 69-year-old male with left side chest pain,
tightness and shortness of breath.

EXAM:
CHEST - 2 VIEW

[chest pa]
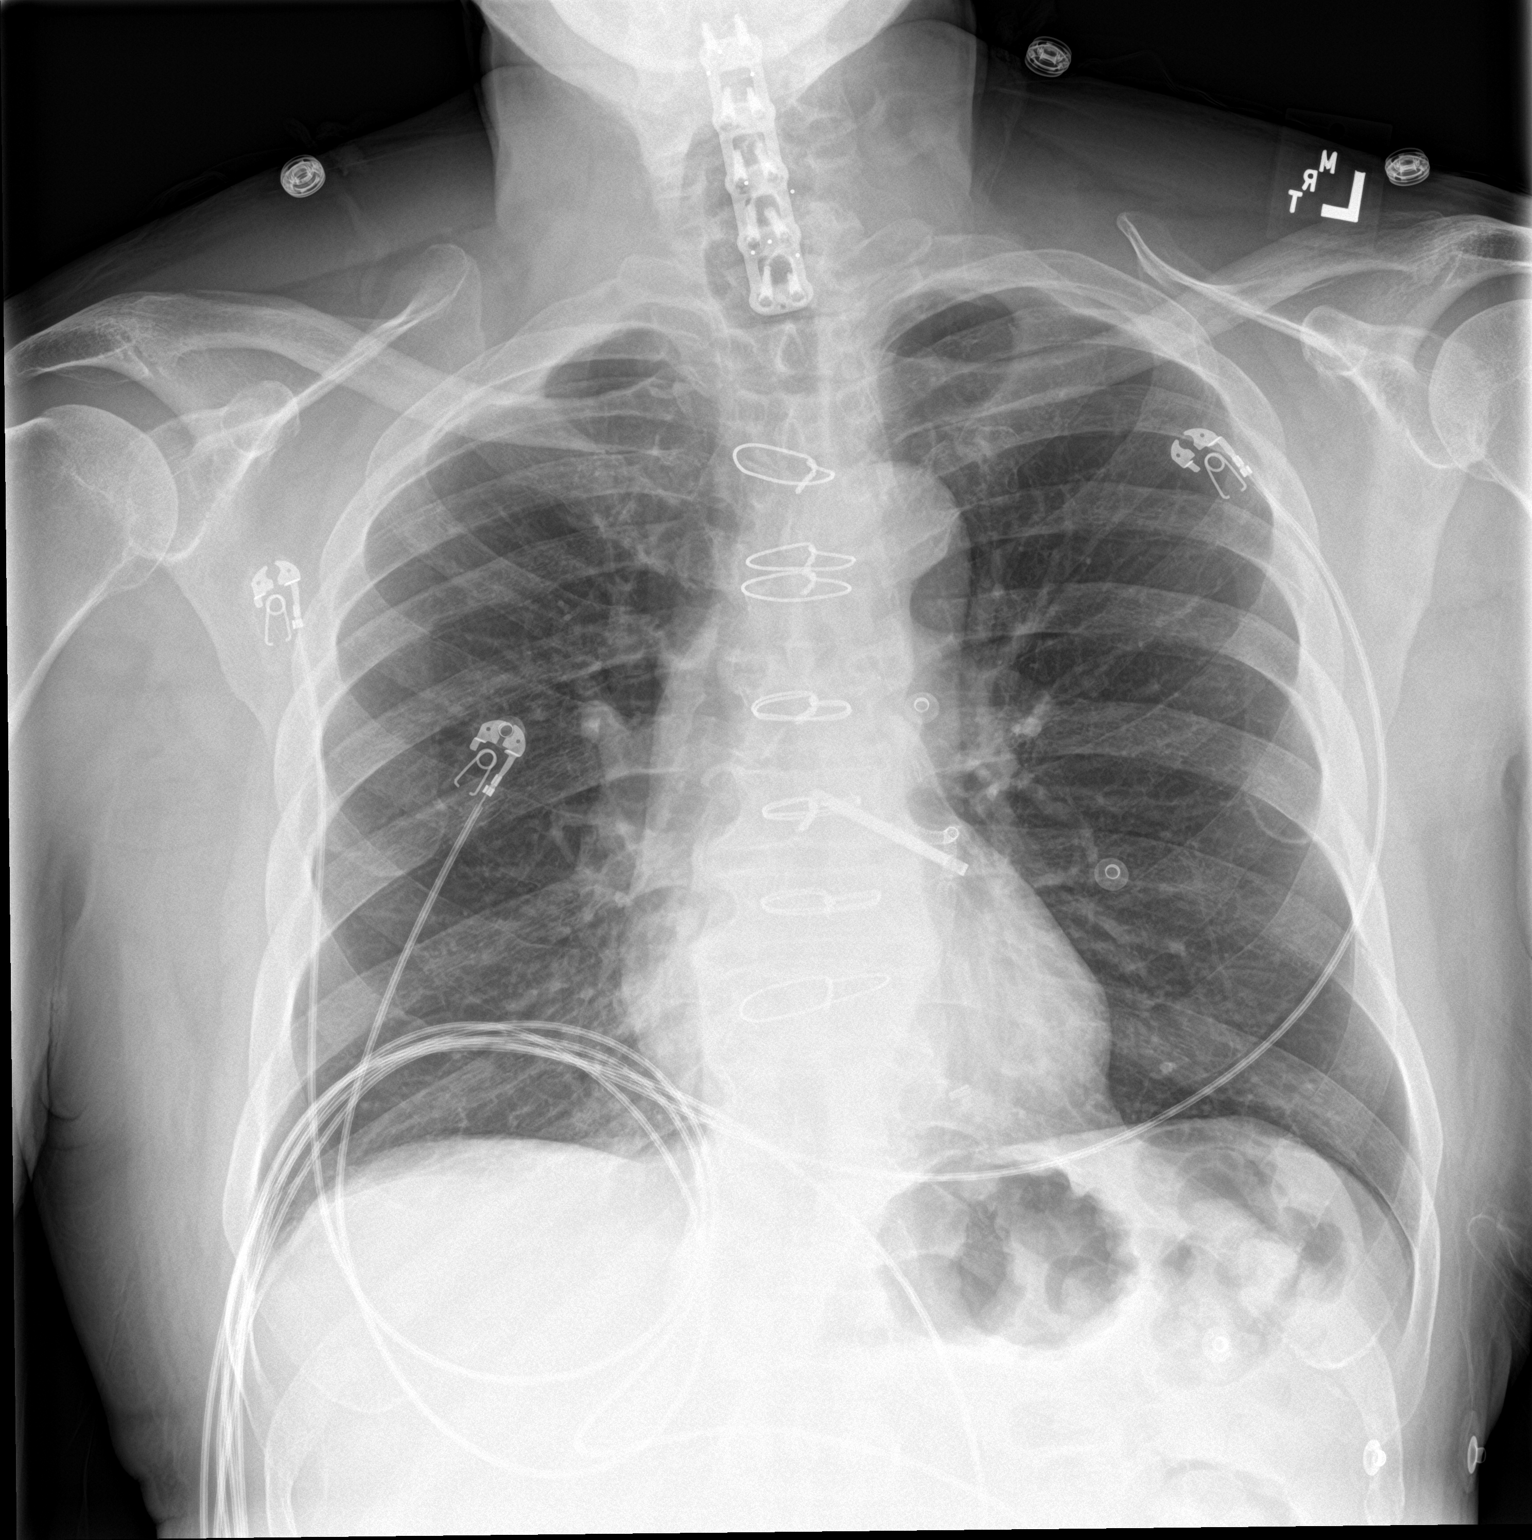

[chest lat]
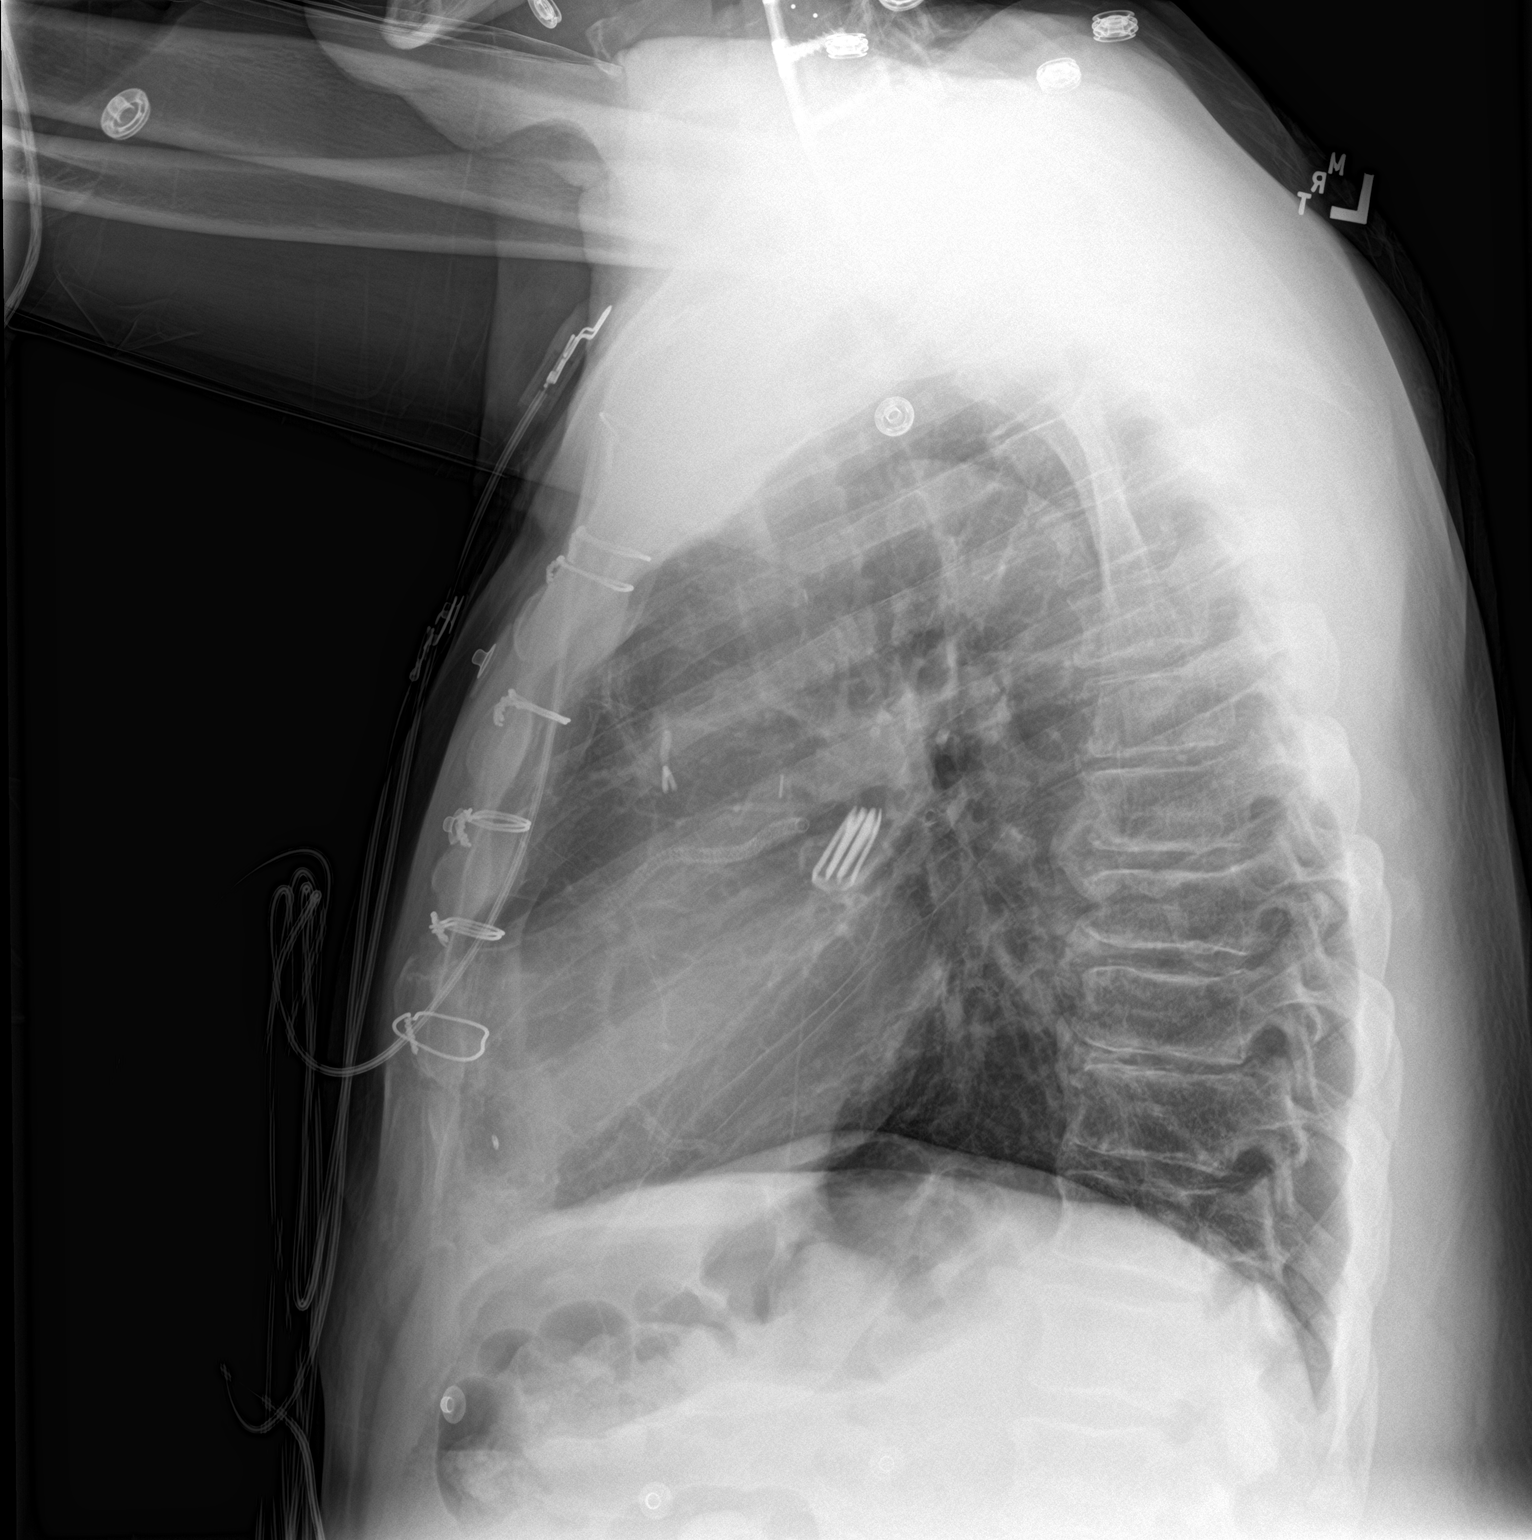

[2 of 2 positions shown; findings below may reference images not displayed]

FINDINGS: Mediastinal contours are stable and within normal limits. Normal
lung volumes. Prior sternotomy and postoperative changes to the left
atrial appendage appear stable since 4949. Chronic coronary artery
stent(s). No pneumothorax, pulmonary edema, pleural effusion or
confluent pulmonary opacity. Chronic cervical ACDF and thoracic
endplate spurring. No acute osseous abnormality identified. Negative
visible bowel gas pattern.
IMPRESSION: No acute cardiopulmonary abnormality.

## 2022-06-06 IMAGING — MR MR TOES*R* WO/W CM
9 series · 39 of 40 positions shown · IV contrast (gadavist)
Comparison: Radiographs 12/07/2019.  MRI 07/17/2019.

CLINICAL DATA: Osteomyelitis suspected, tip/fib. No prior imaging.
Diabetic foot ulcer right 1st toe. Previous amputation of the 2nd
toe 1 year ago.

EXAM:
MRI OF THE RIGHT TOES WITHOUT AND WITH CONTRAST
TECHNIQUE: Multiplanar, multisequence MR imaging of the right forefoot was
performed both before and after administration of intravenous
contrast.
CONTRAST:  8mL GADAVIST GADOBUTROL 1 MMOL/ML IV SOLN

[Series 4: T1 · coronal · right · 3.0mm · 0.47mm/px · 6 of 40 slices shown (1 of 2)]
[im 1/40]
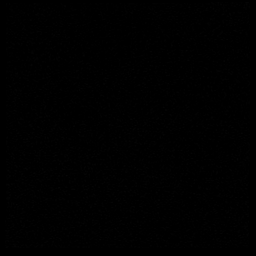
[im 8/40]
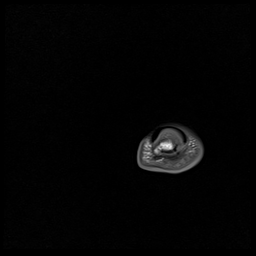
[im 16/40]
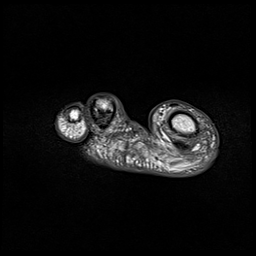
[im 24/40]
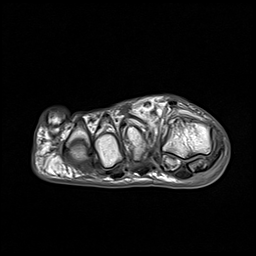
[im 32/40]
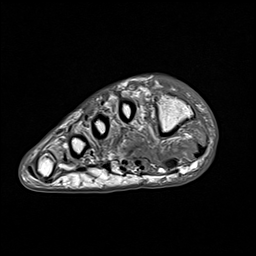
[im 40/40]
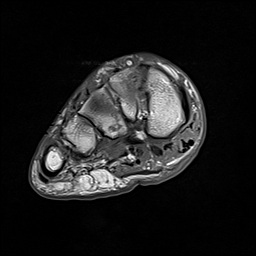

[Series 5: T2 fat-sat · coronal · right · 3.0mm · 0.38mm/px · 6 of 40 slices shown (1 of 2)]
[im 1/40]
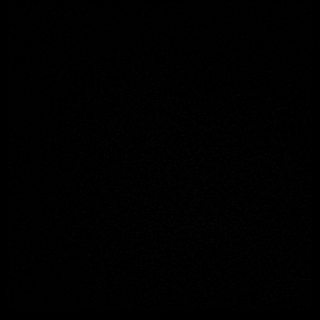
[im 8/40]
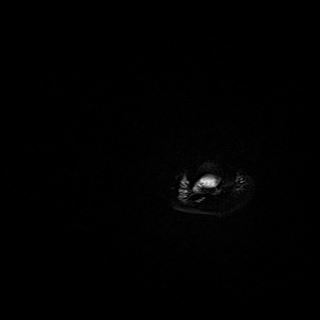
[im 16/40]
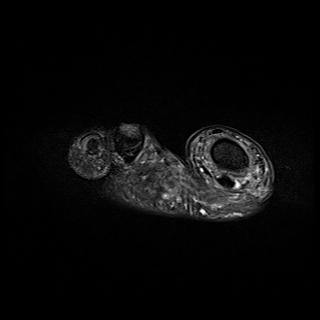
[im 24/40]
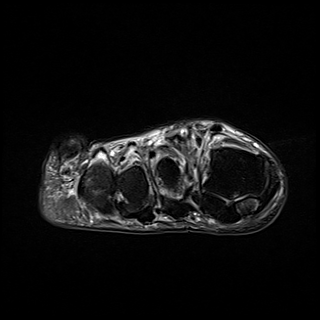
[im 32/40]
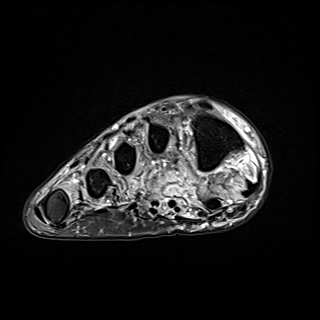
[im 40/40]
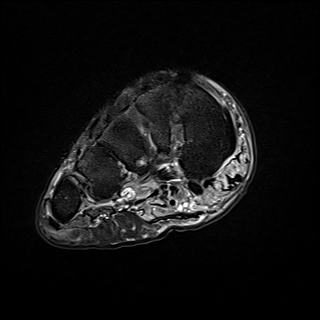

[Series 6: T2 fat-sat · axial · right · 3.0mm · 0.70mm/px · z∈[-71,+2]mm · 3 of 22 slices shown (2 of 2)]
[im 1/22]
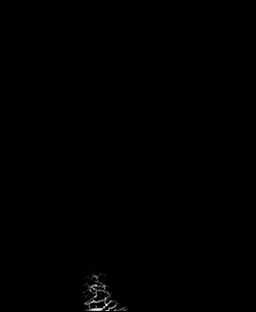
[im 11/22]
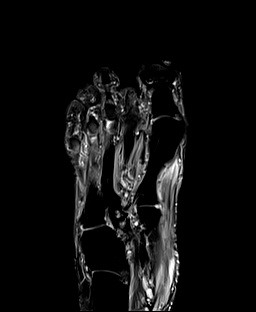
[im 22/22]
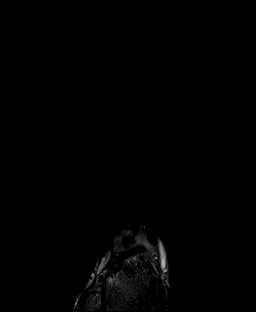

[Series 7: T1 · axial · right · 3.0mm · 0.70mm/px · z∈[-71,+2]mm · 3 of 22 slices shown (2 of 2)]
[im 1/22]
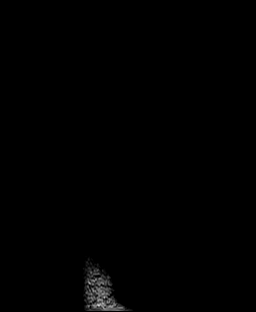
[im 11/22]
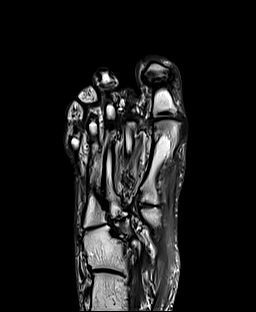
[im 22/22]
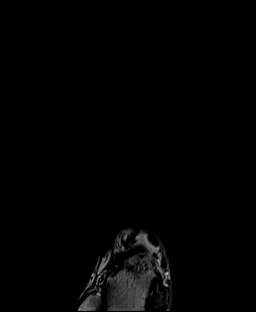

[Series 8: STIR · sagittal · right · 3.0mm · 0.35mm/px · 3 of 30 slices shown]
[im 1/30]
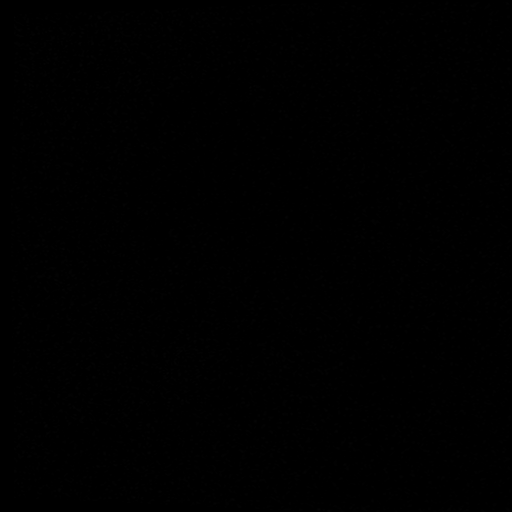
[im 10/30]
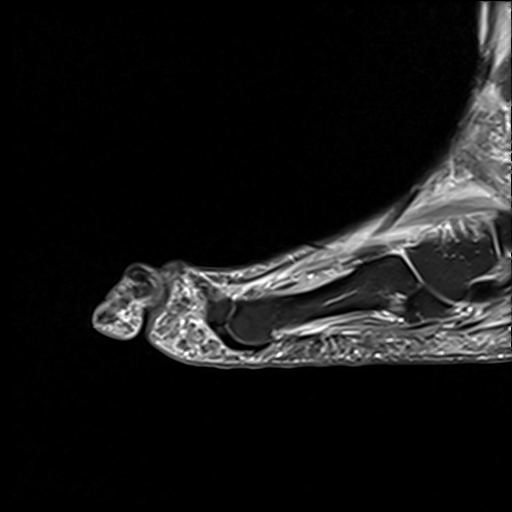
[im 20/30]
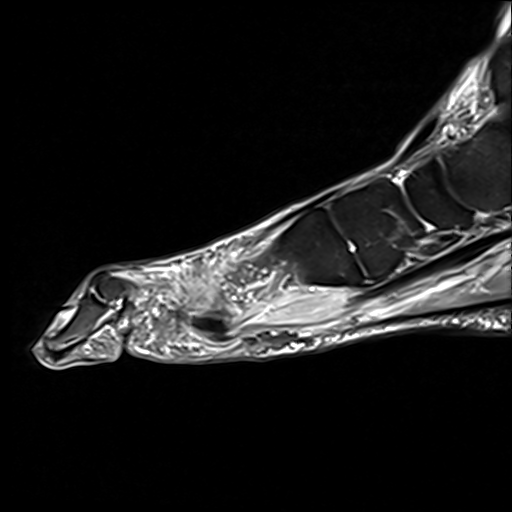

[Series 9: T1 fat-sat · coronal · non-contrast · right · 3.0mm · 0.47mm/px · 6 of 40 slices shown]
[im 1/40]
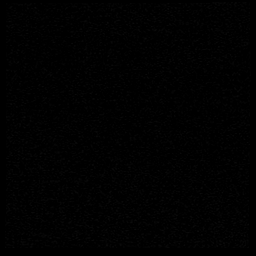
[im 8/40]
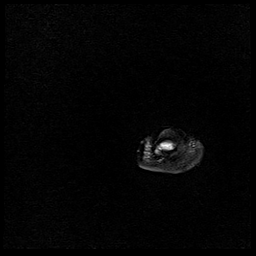
[im 16/40]
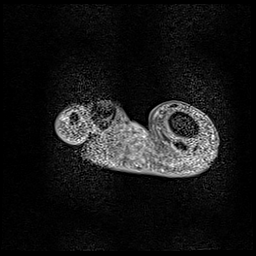
[im 24/40]
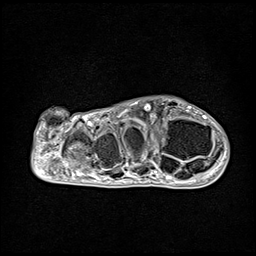
[im 32/40]
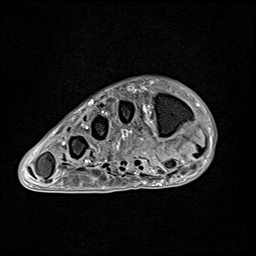
[im 40/40]
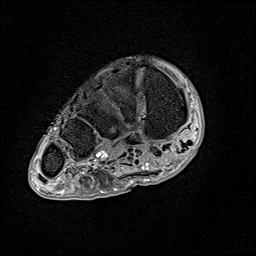

[Series 10: T1 fat-sat post-contrast · coronal · right · 3.0mm · 0.47mm/px · 5 of 39 slices shown (1 of 3)]
[im 1/39]
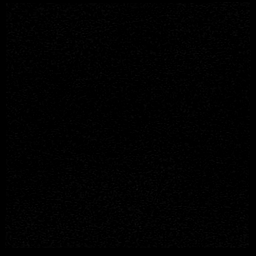
[im 10/39]
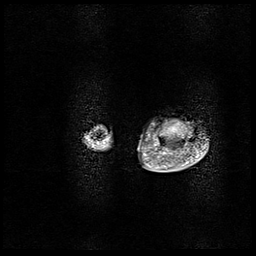
[im 20/39]
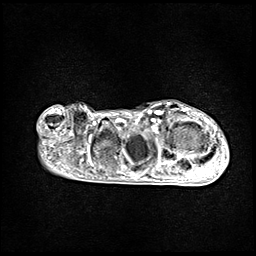
[im 29/39]
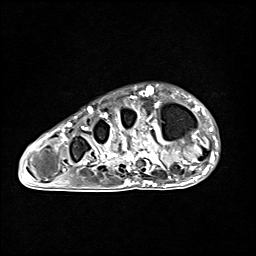
[im 39/39]
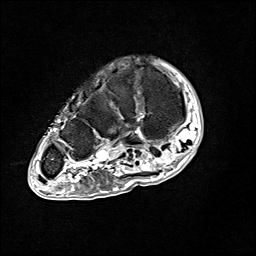

[Series 11: T1 fat-sat post-contrast · sagittal · right · 3.0mm · 0.35mm/px · 4 of 29 slices shown (2 of 3)]
[im 1/29]
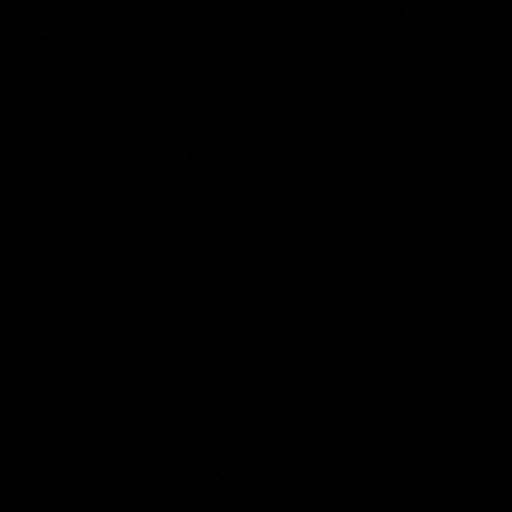
[im 10/29]
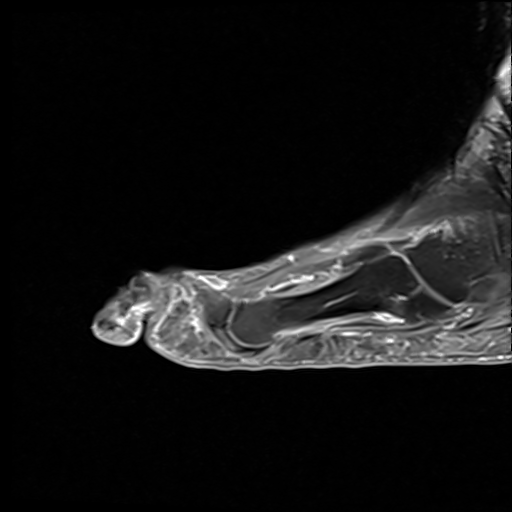
[im 19/29]
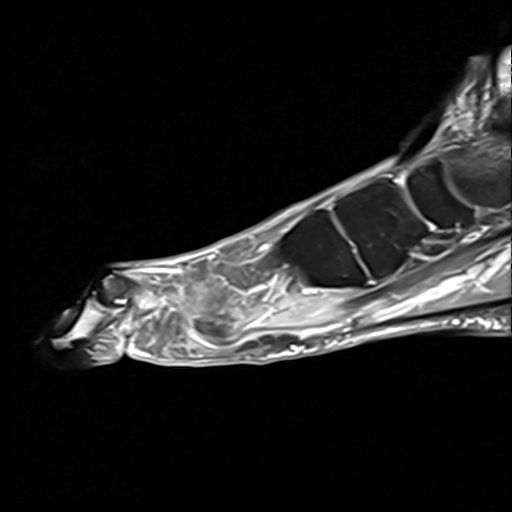
[im 29/29]
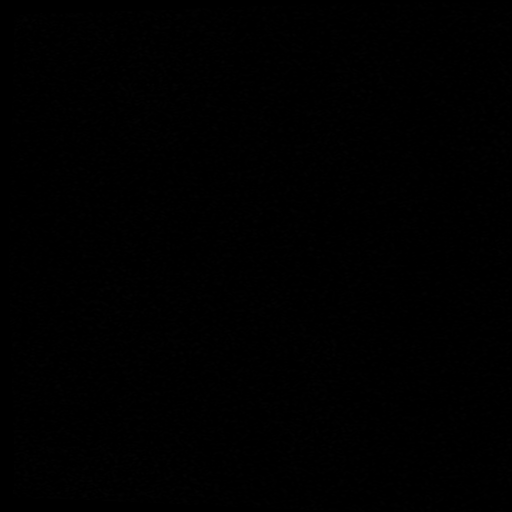

[Series 12: T1 fat-sat post-contrast · axial · right · 3.0mm · 0.56mm/px · z∈[-68,+5]mm · 3 of 22 slices shown (3 of 3)]
[im 1/22]
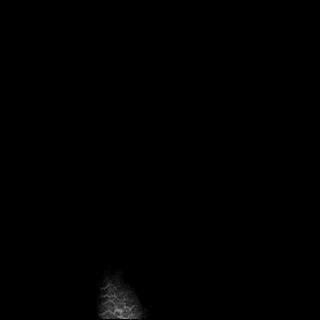
[im 11/22]
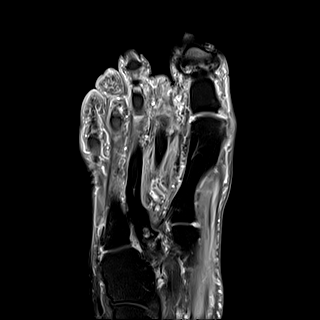
[im 22/22]
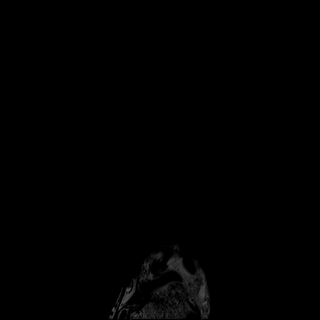

[39 of 40 positions shown; findings below may reference images not displayed]

FINDINGS: Bones/Joint/Cartilage

Interval amputation of the 2nd toe. The remaining toes demonstrate
mild flexion deformities. There is limited fat saturation within the
distal 1st phalanx on the T2 and postcontrast images. No cortical
destruction, T1 marrow signal abnormality or definite stir
hyperintensity identified to suggest osteomyelitis. The additional
toes and metatarsals appear unremarkable aside from chronic
posttraumatic deformities of the 4th and 5th metatarsal necks.
Probable reactive edema within the tibial sesamoid of the 1st
metatarsal. Mild midfoot degenerative changes with subchondral cysts
in the lateral cuneiform and 3rd metatarsal base. No significant
joint effusions.

Ligaments

The collateral ligaments of the metatarsophalangeal joints appear
intact. The Lisfranc ligament is intact.

Muscles and Tendons

The forefoot muscles and tendons appear normal. No focal fluid
collection or tenosynovitis.

Soft tissues

Suggested soft tissue ulceration of the distal great toe with
underlying mild soft tissue swelling and enhancement. No focal fluid
collection or foreign body identified. Stable mild pressure lesions
plantar to the 1st and 5th MTP joints.
IMPRESSION: 1. Suggested soft tissue ulceration of the distal great toe without
underlying abscess.
2. No definite signs of osteomyelitis. Distal 1st phalangeal
assessment is mildly limited by flexion deformity and incomplete fat
saturation, but no cortical destruction or worrisome STIR signal
identified.

## 2022-07-02 ENCOUNTER — Emergency Department (HOSPITAL_COMMUNITY): Payer: Medicare Other

## 2022-07-02 ENCOUNTER — Inpatient Hospital Stay (HOSPITAL_COMMUNITY)
Admission: EM | Admit: 2022-07-02 | Discharge: 2022-07-08 | DRG: 918 | Disposition: A | Discharge status: Home / Self Care | Payer: Medicare Other | Attending: Internal Medicine | Admitting: Internal Medicine

## 2022-07-02 ENCOUNTER — Encounter (HOSPITAL_COMMUNITY): Payer: Self-pay

## 2022-07-02 ENCOUNTER — Other Ambulatory Visit: Payer: Self-pay

## 2022-07-02 DIAGNOSIS — G8929 Other chronic pain: Secondary | ICD-10-CM | POA: Diagnosis present

## 2022-07-02 DIAGNOSIS — D696 Thrombocytopenia, unspecified: Secondary | ICD-10-CM | POA: Diagnosis present

## 2022-07-02 DIAGNOSIS — J4489 Other specified chronic obstructive pulmonary disease: Secondary | ICD-10-CM | POA: Diagnosis present

## 2022-07-02 DIAGNOSIS — R809 Proteinuria, unspecified: Secondary | ICD-10-CM | POA: Diagnosis present

## 2022-07-02 DIAGNOSIS — M5412 Radiculopathy, cervical region: Secondary | ICD-10-CM | POA: Diagnosis present

## 2022-07-02 DIAGNOSIS — R3129 Other microscopic hematuria: Secondary | ICD-10-CM | POA: Diagnosis present

## 2022-07-02 DIAGNOSIS — Z8673 Personal history of transient ischemic attack (TIA), and cerebral infarction without residual deficits: Secondary | ICD-10-CM

## 2022-07-02 DIAGNOSIS — I255 Ischemic cardiomyopathy: Secondary | ICD-10-CM | POA: Diagnosis present

## 2022-07-02 DIAGNOSIS — E785 Hyperlipidemia, unspecified: Secondary | ICD-10-CM | POA: Diagnosis present

## 2022-07-02 DIAGNOSIS — I4891 Unspecified atrial fibrillation: Secondary | ICD-10-CM | POA: Diagnosis present

## 2022-07-02 DIAGNOSIS — T405X1A Poisoning by cocaine, accidental (unintentional), initial encounter: Principal | ICD-10-CM | POA: Diagnosis present

## 2022-07-02 DIAGNOSIS — Z7902 Long term (current) use of antithrombotics/antiplatelets: Secondary | ICD-10-CM

## 2022-07-02 DIAGNOSIS — Z21 Asymptomatic human immunodeficiency virus [HIV] infection status: Secondary | ICD-10-CM | POA: Diagnosis present

## 2022-07-02 DIAGNOSIS — Z951 Presence of aortocoronary bypass graft: Secondary | ICD-10-CM

## 2022-07-02 DIAGNOSIS — E1122 Type 2 diabetes mellitus with diabetic chronic kidney disease: Secondary | ICD-10-CM | POA: Diagnosis present

## 2022-07-02 DIAGNOSIS — Z87891 Personal history of nicotine dependence: Secondary | ICD-10-CM

## 2022-07-02 DIAGNOSIS — I498 Other specified cardiac arrhythmias: Secondary | ICD-10-CM | POA: Diagnosis not present

## 2022-07-02 DIAGNOSIS — R634 Abnormal weight loss: Secondary | ICD-10-CM | POA: Diagnosis present

## 2022-07-02 DIAGNOSIS — R008 Other abnormalities of heart beat: Secondary | ICD-10-CM | POA: Diagnosis present

## 2022-07-02 DIAGNOSIS — I2510 Atherosclerotic heart disease of native coronary artery without angina pectoris: Secondary | ICD-10-CM | POA: Diagnosis present

## 2022-07-02 DIAGNOSIS — Z89421 Acquired absence of other right toe(s): Secondary | ICD-10-CM

## 2022-07-02 DIAGNOSIS — Z79899 Other long term (current) drug therapy: Secondary | ICD-10-CM

## 2022-07-02 DIAGNOSIS — Z809 Family history of malignant neoplasm, unspecified: Secondary | ICD-10-CM

## 2022-07-02 DIAGNOSIS — E872 Acidosis, unspecified: Secondary | ICD-10-CM | POA: Diagnosis present

## 2022-07-02 DIAGNOSIS — F32A Depression, unspecified: Secondary | ICD-10-CM | POA: Diagnosis present

## 2022-07-02 DIAGNOSIS — N179 Acute kidney failure, unspecified: Secondary | ICD-10-CM | POA: Diagnosis present

## 2022-07-02 DIAGNOSIS — Z7901 Long term (current) use of anticoagulants: Secondary | ICD-10-CM

## 2022-07-02 DIAGNOSIS — R0789 Other chest pain: Secondary | ICD-10-CM | POA: Diagnosis present

## 2022-07-02 DIAGNOSIS — K219 Gastro-esophageal reflux disease without esophagitis: Secondary | ICD-10-CM | POA: Diagnosis present

## 2022-07-02 DIAGNOSIS — N1831 Chronic kidney disease, stage 3a: Secondary | ICD-10-CM | POA: Diagnosis present

## 2022-07-02 DIAGNOSIS — Z5901 Sheltered homelessness: Secondary | ICD-10-CM

## 2022-07-02 DIAGNOSIS — R079 Chest pain, unspecified: Principal | ICD-10-CM

## 2022-07-02 DIAGNOSIS — I159 Secondary hypertension, unspecified: Secondary | ICD-10-CM | POA: Diagnosis present

## 2022-07-02 DIAGNOSIS — F1410 Cocaine abuse, uncomplicated: Secondary | ICD-10-CM | POA: Diagnosis present

## 2022-07-02 DIAGNOSIS — Z6824 Body mass index (BMI) 24.0-24.9, adult: Secondary | ICD-10-CM

## 2022-07-02 DIAGNOSIS — I5042 Chronic combined systolic (congestive) and diastolic (congestive) heart failure: Secondary | ICD-10-CM | POA: Diagnosis present

## 2022-07-02 DIAGNOSIS — D631 Anemia in chronic kidney disease: Secondary | ICD-10-CM | POA: Diagnosis present

## 2022-07-02 DIAGNOSIS — Z955 Presence of coronary angioplasty implant and graft: Secondary | ICD-10-CM

## 2022-07-02 DIAGNOSIS — R296 Repeated falls: Secondary | ICD-10-CM | POA: Diagnosis present

## 2022-07-02 DIAGNOSIS — Y929 Unspecified place or not applicable: Secondary | ICD-10-CM

## 2022-07-02 LAB — TROPONIN I (HIGH SENSITIVITY)
Troponin I (High Sensitivity): 18 ng/L — ABNORMAL HIGH (ref <18)
Troponin I (High Sensitivity): 19 ng/L — ABNORMAL HIGH (ref <18)

## 2022-07-02 LAB — RAPID URINE DRUG SCREEN, HOSP PERFORMED
Amphetamines: POSITIVE — AB
Barbiturates: NOT DETECTED
Benzodiazepines: NOT DETECTED
Cocaine: POSITIVE — AB
Opiates: NOT DETECTED
Tetrahydrocannabinol: NOT DETECTED

## 2022-07-02 LAB — CBC WITH DIFFERENTIAL/PLATELET
Abs Immature Granulocytes: 0.02 10*3/uL (ref 0.00–0.07)
Basophils Absolute: 0 10*3/uL (ref 0.0–0.1)
Basophils Relative: 1 %
Eosinophils Absolute: 0.1 10*3/uL (ref 0.0–0.5)
Eosinophils Relative: 1 %
HCT: 31.5 % — ABNORMAL LOW (ref 39.0–52.0)
Hemoglobin: 9.8 g/dL — ABNORMAL LOW (ref 13.0–17.0)
Immature Granulocytes: 0 %
Lymphocytes Relative: 23 %
Lymphs Abs: 1.2 10*3/uL (ref 0.7–4.0)
MCH: 27.5 pg (ref 26.0–34.0)
MCHC: 31.1 g/dL (ref 30.0–36.0)
MCV: 88.5 fL (ref 80.0–100.0)
Monocytes Absolute: 0.4 10*3/uL (ref 0.1–1.0)
Monocytes Relative: 7 %
Neutro Abs: 3.6 10*3/uL (ref 1.7–7.7)
Neutrophils Relative %: 68 %
Platelets: 114 10*3/uL — ABNORMAL LOW (ref 150–400)
RBC: 3.56 MIL/uL — ABNORMAL LOW (ref 4.22–5.81)
RDW: 13.8 % (ref 11.5–15.5)
WBC: 5.3 10*3/uL (ref 4.0–10.5)
nRBC: 0 % (ref 0.0–0.2)

## 2022-07-02 LAB — URINALYSIS, MICROSCOPIC (REFLEX)
Bacteria, UA: NONE SEEN
Squamous Epithelial / HPF: NONE SEEN (ref 0–5)

## 2022-07-02 LAB — COMPREHENSIVE METABOLIC PANEL
ALT: 14 U/L (ref 0–44)
AST: 22 U/L (ref 15–41)
Albumin: 4 g/dL (ref 3.5–5.0)
Alkaline Phosphatase: 94 U/L (ref 38–126)
Anion gap: 13 (ref 5–15)
BUN: 68 mg/dL — ABNORMAL HIGH (ref 8–23)
CO2: 19 mmol/L — ABNORMAL LOW (ref 22–32)
Calcium: 9.2 mg/dL (ref 8.9–10.3)
Chloride: 108 mmol/L (ref 98–111)
Creatinine, Ser: 4.22 mg/dL — ABNORMAL HIGH (ref 0.61–1.24)
GFR, Estimated: 14 mL/min — ABNORMAL LOW (ref >60)
Glucose, Bld: 216 mg/dL — ABNORMAL HIGH (ref 70–99)
Potassium: 4.8 mmol/L (ref 3.5–5.1)
Sodium: 140 mmol/L (ref 135–145)
Total Bilirubin: 0.9 mg/dL (ref 0.3–1.2)
Total Protein: 7.6 g/dL (ref 6.5–8.1)

## 2022-07-02 LAB — URINALYSIS, ROUTINE W REFLEX MICROSCOPIC
Bilirubin Urine: NEGATIVE
Glucose, UA: 100 mg/dL — AB
Ketones, ur: NEGATIVE mg/dL
Leukocytes,Ua: NEGATIVE
Nitrite: NEGATIVE
Protein, ur: >300 mg/dL — AB
Specific Gravity, Urine: 1.025 (ref 1.005–1.030)
pH: 5.5 (ref 5.0–8.0)

## 2022-07-02 LAB — MAGNESIUM: Magnesium: 1.8 mg/dL (ref 1.7–2.4)

## 2022-07-02 LAB — VITAMIN B12: Vitamin B-12: 505 pg/mL (ref 180–914)

## 2022-07-02 LAB — GLUCOSE, CAPILLARY
Glucose-Capillary: 281 mg/dL — ABNORMAL HIGH (ref 70–99)
Glucose-Capillary: 192 mg/dL — ABNORMAL HIGH (ref 70–99)

## 2022-07-02 LAB — HEMOGLOBIN A1C
Hgb A1c MFr Bld: 7.7 % — ABNORMAL HIGH (ref 4.8–5.6)
Mean Plasma Glucose: 174.29 mg/dL

## 2022-07-02 LAB — PHOSPHORUS: Phosphorus: 4.1 mg/dL (ref 2.5–4.6)

## 2022-07-02 LAB — BRAIN NATRIURETIC PEPTIDE: B Natriuretic Peptide: 169.9 pg/mL — ABNORMAL HIGH (ref 0.0–100.0)

## 2022-07-02 MED ORDER — APIXABAN 5 MG PO TABS
5.0000 mg | ORAL_TABLET | Freq: Two times a day (BID) | ORAL | Status: DC
  Administered 2022-07-03 – 2022-07-08 (×11): 5 mg via ORAL
  Filled 2022-07-02 (×11): qty 1

## 2022-07-02 MED ORDER — CLOPIDOGREL BISULFATE 75 MG PO TABS
75.0000 mg | ORAL_TABLET | Freq: Every day | ORAL | Status: DC
  Administered 2022-07-02 – 2022-07-08 (×7): 75 mg via ORAL
  Filled 2022-07-02 (×7): qty 1

## 2022-07-02 MED ORDER — SEVELAMER CARBONATE 800 MG PO TABS
800.0000 mg | ORAL_TABLET | Freq: Three times a day (TID) | ORAL | Status: DC
  Administered 2022-07-02 – 2022-07-08 (×18): 800 mg via ORAL
  Filled 2022-07-02 (×18): qty 1

## 2022-07-02 MED ORDER — NITROGLYCERIN 0.4 MG SL SUBL
0.4000 mg | SUBLINGUAL_TABLET | Freq: Once | SUBLINGUAL | Status: AC
  Administered 2022-07-02: 0.4 mg via SUBLINGUAL
  Filled 2022-07-02: qty 1

## 2022-07-02 MED ORDER — AMLODIPINE BESYLATE 5 MG PO TABS
10.0000 mg | ORAL_TABLET | Freq: Once | ORAL | Status: AC
  Administered 2022-07-02: 10 mg via ORAL
  Filled 2022-07-02: qty 2

## 2022-07-02 MED ORDER — ALBUTEROL SULFATE HFA 108 (90 BASE) MCG/ACT IN AERS: 1.0000 | INHALATION_SPRAY | Freq: Four times a day (QID) | RESPIRATORY_TRACT | Status: DC | PRN

## 2022-07-02 MED ORDER — ALBUTEROL SULFATE (2.5 MG/3ML) 0.083% IN NEBU: 2.5000 mg | INHALATION_SOLUTION | Freq: Four times a day (QID) | RESPIRATORY_TRACT | Status: DC | PRN

## 2022-07-02 MED ORDER — ACETAMINOPHEN 500 MG PO TABS
1000.0000 mg | ORAL_TABLET | Freq: Four times a day (QID) | ORAL | Status: DC
  Administered 2022-07-02 – 2022-07-08 (×22): 1000 mg via ORAL
  Filled 2022-07-02 (×23): qty 2

## 2022-07-02 MED ORDER — FLUOXETINE HCL 20 MG PO CAPS
20.0000 mg | ORAL_CAPSULE | Freq: Every day | ORAL | Status: DC
  Administered 2022-07-02 – 2022-07-08 (×7): 20 mg via ORAL
  Filled 2022-07-02 (×7): qty 1

## 2022-07-02 MED ORDER — ATORVASTATIN CALCIUM 40 MG PO TABS
40.0000 mg | ORAL_TABLET | Freq: Every day | ORAL | Status: DC
  Administered 2022-07-02 – 2022-07-08 (×7): 40 mg via ORAL
  Filled 2022-07-02 (×7): qty 1

## 2022-07-02 MED ORDER — ISOSORBIDE MONONITRATE ER 60 MG PO TB24
120.0000 mg | ORAL_TABLET | Freq: Every day | ORAL | Status: DC
  Administered 2022-07-02: 120 mg via ORAL
  Filled 2022-07-02 (×2): qty 2

## 2022-07-02 MED ORDER — INSULIN ASPART 100 UNIT/ML IJ SOLN
0.0000 [IU] | Freq: Three times a day (TID) | INTRAMUSCULAR | Status: DC
  Administered 2022-07-02: 5 [IU] via SUBCUTANEOUS
  Administered 2022-07-03 (×3): 3 [IU] via SUBCUTANEOUS
  Administered 2022-07-04 (×2): 2 [IU] via SUBCUTANEOUS
  Administered 2022-07-04: 3 [IU] via SUBCUTANEOUS
  Administered 2022-07-05: 2 [IU] via SUBCUTANEOUS
  Administered 2022-07-05: 3 [IU] via SUBCUTANEOUS
  Administered 2022-07-05 – 2022-07-07 (×7): 2 [IU] via SUBCUTANEOUS
  Administered 2022-07-08: 1 [IU] via SUBCUTANEOUS

## 2022-07-02 MED ORDER — DOLUTEGRAVIR-RILPIVIRINE 50-25 MG PO TABS: 1.0000 | ORAL_TABLET | Freq: Every day | ORAL | Status: DC

## 2022-07-02 MED ORDER — DILTIAZEM HCL ER COATED BEADS 240 MG PO CP24
240.0000 mg | ORAL_CAPSULE | Freq: Every day | ORAL | Status: DC
  Administered 2022-07-02 – 2022-07-08 (×7): 240 mg via ORAL
  Filled 2022-07-02 (×8): qty 1

## 2022-07-02 MED ORDER — FLUTICASONE FUROATE-VILANTEROL 200-25 MCG/ACT IN AEPB
1.0000 | INHALATION_SPRAY | Freq: Every day | RESPIRATORY_TRACT | Status: DC
  Administered 2022-07-03 – 2022-07-08 (×6): 1 via RESPIRATORY_TRACT
  Filled 2022-07-02: qty 28

## 2022-07-02 MED ORDER — LIDOCAINE 5 % EX PTCH
1.0000 | MEDICATED_PATCH | CUTANEOUS | Status: DC
  Administered 2022-07-02 – 2022-07-07 (×6): 1 via TRANSDERMAL
  Filled 2022-07-02 (×6): qty 1

## 2022-07-02 MED ORDER — DOLUTEGRAVIR-RILPIVIRINE 50-25 MG PO TABS
1.0000 | ORAL_TABLET | Freq: Every day | ORAL | Status: DC
  Administered 2022-07-03 – 2022-07-08 (×6): 1 via ORAL
  Filled 2022-07-02 (×8): qty 1

## 2022-07-02 MED ORDER — CLOPIDOGREL BISULFATE 75 MG PO TABS: 75.0000 mg | ORAL_TABLET | Freq: Every day | ORAL | Status: DC

## 2022-07-02 MED ORDER — INSULIN GLARGINE-YFGN 100 UNIT/ML ~~LOC~~ SOLN: 10.0000 [IU] | Freq: Every day | SUBCUTANEOUS | Status: DC

## 2022-07-02 MED ORDER — NITROGLYCERIN 2 % TD OINT
1.0000 [in_us] | TOPICAL_OINTMENT | Freq: Once | TRANSDERMAL | Status: AC
  Administered 2022-07-02: 1 [in_us] via TOPICAL
  Filled 2022-07-02: qty 1

## 2022-07-02 MED ORDER — ISOSORBIDE MONONITRATE ER 30 MG PO TB24: 120.0000 mg | ORAL_TABLET | Freq: Every day | ORAL | Status: DC

## 2022-07-02 MED ORDER — ENOXAPARIN SODIUM 30 MG/0.3ML IJ SOSY: 30.0000 mg | PREFILLED_SYRINGE | INTRAMUSCULAR | Status: DC

## 2022-07-02 MED ORDER — FUROSEMIDE 10 MG/ML IJ SOLN
40.0000 mg | Freq: Once | INTRAMUSCULAR | Status: AC
  Administered 2022-07-02: 40 mg via INTRAVENOUS
  Filled 2022-07-02: qty 4

## 2022-07-02 NOTE — ED Provider Notes (Signed)
[facility] EMERGENCY DEPARTMENT Provider Note   CSN: [id number] Arrival date & time: 07/02/22  4492     History  Chief Complaint  Patient presents with   Chest Pain   Shortness of Breath    Michael [name] is a 71 y.o. male. With pmh HIV on HAART, CAD status post CABG, A-fib on Xarelto, COPD, CHF with recovered EF, cocaine/polysubstance abuse, GERD, hypertension, hyperlipidemia, CKD who presents with chest pain and shortness of breath.  Patient said he was just recently admitted to the hospital at [facility] and was released last week where he was admitted for heart failure exacerbation and demand ischemia from cocaine use as well as AKI on CKD.  He was then released to a rehab facility where he left yesterday.  He has been receiving all of his home meds at the rehab facility and not missing any of his medications.  He says he has been having good urination.  However when he went to go get his mail yesterday he said he smoked crack cocaine at least a little bit over $100 worth.  After smoking he developed stabbing and tight chest pain that radiates from his left to right chest and left neck.  Is been associate with shortness of breath on exertion.  He denies any weight gain or lower extremity swelling.  He denies any fevers or productive cough.  He denies any pain in his abdomen or radiating down his legs or into his back.   Chest Pain Associated symptoms: shortness of breath   Shortness of Breath Associated symptoms: chest pain        Home Medications Prior to Admission medications   Medication Sig Start Date End Date Taking? Authorizing Provider  albuterol (VENTOLIN HFA) 108 (90 Base) MCG/ACT inhaler Inhale 1-2 puffs into the lungs every 6 (six) hours as needed for wheezing or shortness of breath. 10/24/21   [name], MD  amLODipine (NORVASC) 10 MG tablet Take 1 tablet (10 mg total) by mouth daily. 10/24/21   [name], MD  aspirin EC 81 MG  tablet Take 81 mg by mouth daily. Swallow whole.    [provider]  bictegravir-emtricitabine-tenofovir AF (BIKTARVY) 50-200-25 MG TABS tablet Take 1 tablet by mouth daily. 10/24/21   [name], MD  budesonide-formoterol [facility]) 160-4.5 MCG/ACT inhaler Inhale 2 puffs into the lungs in the morning and at bedtime. 10/24/21   [name], MD  buPROPion (WELLBUTRIN XL) 150 MG 24 hr tablet Take 1 tablet (150 mg total) by mouth every morning. 10/24/21   [name], MD  hydrALAZINE (APRESOLINE) 25 MG tablet Take 3 tablets (75 mg total) by mouth 3 (three) times daily. 10/24/21 12/23/21  [name], MD  insulin glargine (LANTUS SOLOSTAR) 100 UNIT/ML Solostar Pen Inject 30 Units into the skin daily. 10/24/21   [name], MD  insulin lispro (HUMALOG KWIKPEN) 100 UNIT/ML KwikPen Inject 4 Units into the skin 3 (three) times daily with meals. 10/24/21   [name], MD  Insulin Pen Needle 31G X 5 MM MISC Use with insulin pens Patient taking differently: 1 each by Other route See admin instructions. Use with insulin pens 10/24/21   [name], MD  isosorbide mononitrate (IMDUR) 60 MG 24 hr tablet Take 2 tablets (120 mg total) by mouth daily. 10/24/21   [name], MD  lidocaine (LIDODERM) 5 % Place 1 patch onto the skin daily as needed (For chestwall pain). Remove &  Discard patch within 12 hours or as directed by MD 10/24/21   [name], MD  nitroGLYCERIN (NITROSTAT) 0.4 MG SL tablet Place 1 tablet (0.4 mg total) under the tongue every 5 (five) minutes as needed for chest pain. 10/24/21   [name], MD  pantoprazole (PROTONIX) 40 MG tablet Take 1 tablet (40 mg total) by mouth daily as needed (for heartburn). 10/24/21   [name], MD  traZODone (DESYREL) 50 MG tablet Take 1 tablet (50 mg total) by mouth at bedtime as needed for sleep. 10/24/21   [name], MD      Allergies    Patient has no known allergies.    Review of  Systems   Review of Systems  Respiratory:  Positive for shortness of breath.   Cardiovascular:  Positive for chest pain.    Physical Exam Updated Vital Signs BP (!) 147/55   Pulse 98   Temp 98.5 F (36.9 C)   Resp 17   Ht '5\' 8"'$  (1.727 m)   Wt 72.1 kg   SpO2 99%   BMI 24.18 kg/m  Physical Exam Constitutional: Alert and oriented. Pleasant NAD. Eyes: Conjunctivae are normal. ENT      Head: Normocephalic and atraumatic.      Nose: No congestion.      Mouth/Throat: Mucous membranes are moist.      Neck: No stridor. Cardiovascular: S1, S2,  irregular, regular rate, distal extremities cool and dry, equal bilateral radial pulses Respiratory: Normal respiratory effort. Breath sounds are normal. O2 sat 100 on RA. Gastrointestinal: Soft and nontender.  Musculoskeletal: Normal range of motion in all extremities. No edema of the lower extremities. Neurologic: Normal speech and language. No gross focal neurologic deficits are appreciated. Skin: Skin is cool, dry, Psychiatric: Mood and affect are normal. Speech and behavior are normal.  ED Results / Procedures / Treatments   Labs (all labs ordered are listed, but only abnormal results are displayed) Labs Reviewed  COMPREHENSIVE METABOLIC PANEL - Abnormal; Notable for the following components:      Result Value   CO2 19 (*)    Glucose, Bld 216 (*)    BUN 68 (*)    Creatinine, Ser 4.22 (*)    GFR, Estimated 14 (*)    All other components within normal limits  CBC WITH DIFFERENTIAL/PLATELET - Abnormal; Notable for the following components:   RBC 3.56 (*)    Hemoglobin 9.8 (*)    HCT 31.5 (*)    Platelets 114 (*)    All other components within normal limits  BRAIN NATRIURETIC PEPTIDE - Abnormal; Notable for the following components:   B Natriuretic Peptide 169.9 (*)    All other components within normal limits  RAPID URINE DRUG SCREEN, HOSP PERFORMED - Abnormal; Notable for the following components:   Cocaine POSITIVE (*)     Amphetamines POSITIVE (*)    All other components within normal limits  URINALYSIS, ROUTINE W REFLEX MICROSCOPIC - Abnormal; Notable for the following components:   Glucose, UA 100 (*)    Hgb urine dipstick MODERATE (*)    Protein, ur >300 (*)    All other components within normal limits  TROPONIN I (HIGH SENSITIVITY) - Abnormal; Notable for the following components:   Troponin I (High Sensitivity) 18 (*)    All other components within normal limits  TROPONIN I (HIGH SENSITIVITY) - Abnormal; Notable for the following components:   Troponin I (High Sensitivity) 19 (*)    All other  components within normal limits  URINALYSIS, MICROSCOPIC (REFLEX)    EKG EKG Interpretation  Date/Time:  Tuesday July 02 2022 07:07:58 EDT Ventricular Rate:  78 PR Interval:  156 QRS Duration: 79 QT Interval:  387 QTC Calculation: 441 R Axis:   63 Text Interpretation: Sinus rhythm Consider left ventricular hypertrophy ST elevation, consider anterior injury Early repolarization No acute changes T wave inversion lateral I/avL Confirmed by Georgina Snell [phone]) on 07/02/2022 7:11:20 AM  Radiology DG Chest 2 View  Result Date: 07/02/2022 CLINICAL DATA:  Chest pain. EXAM: CHEST - 2 VIEW COMPARISON:  Chest x-ray February 14, 23. FINDINGS: The heart size and mediastinal contours are within normal limits. Median sternotomy. Left atrial appendage clip. Coronary artery stent. Both lungs are clear. No visible pleural effusions or pneumothorax. No acute osseous abnormality. ACDF. IMPRESSION: No active cardiopulmonary disease. Electronically Signed   By: Margaretha Sheffield M.D.   On: 07/02/2022 07:44    Procedures Ultrasound ED Peripheral IV (Provider)  Date/Time: 07/02/2022 10:51 AM  Performed by: Elgie Congo, MD Authorized by: Elgie Congo, MD   Procedure details:    Indications: multiple failed IV attempts and poor IV access     Skin Prep: chlorhexidine gluconate     Location:  Right  AC   Angiocath:  20 G   Bedside Ultrasound Guided: Yes     Images: not archived     Patient tolerated procedure without complications: Yes     Dressing applied: Yes     Remain on constant cardiac monitoring, intermittent ventricular bigeminy episodes mixed with normal sinus rhythm.  Medications Ordered in ED Medications  nitroGLYCERIN (NITROSTAT) SL tablet 0.4 mg (0.4 mg Sublingual Given 07/02/22 0805)  amLODipine (NORVASC) tablet 10 mg (10 mg Oral Given 07/02/22 1049)  nitroGLYCERIN (NITROGLYN) 2 % ointment 1 inch (1 inch Topical Given 07/02/22 1134)  furosemide (LASIX) injection 40 mg (40 mg Intravenous Given 07/02/22 1135)    ED Course/ Medical Decision Making/ A&P Clinical Course as of 07/02/22 Martin Jul 02, 2022  1113 Reassessed patient.  He is hypertensive and remains in ventricular bigeminy.  He is still endorsing chest pain.  His labs are overall reassuring with stable and flat troponins initial 18 repeat 19.  BNP is 169.  He has not also taken any of his home meds today.  I have ordered for nitroglycerin paste, amlodipine 10 mg, Lasix IV 40 mg.  Due to his underlying risk factors and continued chest pain with new AKI creatinine 4.22, I have paged internal medicine for continued monitoring and observation and likely repeat echo. [VB]  35 Spoke with internal medicine team who will come to see patient and put in orders for admission. [VB]    Clinical Course User Index [VB] Elgie Congo, MD                           Medical Decision Making ames Malic Rosten is a 71 y.o. male. With pmh HIV on HAART, CAD status post CABG, A-fib on Xarelto, COPD, CHF with recovered EF, cocaine/polysubstance abuse, GERD, hypertension, hyperlipidemia, CKD who presents with chest pain and shortness of breath.    Regarding the patient's chest pain, patient has had previous admissions and visits for chest pain in the setting of crack cocaine use.  His last echo showed LV ejection fraction =  40-45%.There is mild global hypokinesis of the left ventricle.  Most recent admission earlier this month at [facility] for demand ischemia heart failure exacerbation and AKI on CKD.  His HEART score is 6 high risk due to his underlying risk factors and age with an EKG that initially showed early repolarization followed by a repeat EKG with ventricular bigeminy.. Will further evaluate for ACS with high-sensitivity troponin at least 3 hours from the patient's start of pain.  Performed bedside POCUS which showed no pericardial effusion.  Chest x-ray obtained showed no pneumonia, no pneumothorax, and no pulmonary edema.  I do not think aortic dissection as the patient is well-appearing, does not have ripping/tearing pain and equally has no pulse or neurologic deficits. They have had no recent instrumentation such as endoscopy, have no crepitus of the chest and are overall well appearing making B[place]erhave's unlikely.  Plan to obtain repeat troponins to evaluate for another evidence of demand ischemia in the setting of crack cocaine use.  Will also give nitroglycerin for chest pain due to recent use of crack cocaine and hypertension.  Anticipate admission due to high risk chest pain.   Amount and/or Complexity of Data Reviewed Labs: ordered. Radiology: ordered.  Risk Prescription drug management. Decision regarding hospitalization.    Final Clinical Impression(s) / ED Diagnoses Final diagnoses:  Chest pain, unspecified type  Bigeminy  AKI (acute kidney injury) ([place])  Secondary hypertension    Rx / DC Orders ED Discharge Orders     None         Elgie Congo, MD 07/02/22 1151

## 2022-07-02 NOTE — ED Notes (Signed)
Pt currently resting with eyes closed, bilateral chest rise and fall, NAD noted.

## 2022-07-02 NOTE — H&P (Cosign Needed Addendum)
Date: 07/02/2022               Patient Name:  Michael [name] MRN: [id number]  DOB: Feb 28, 1951 Age / Sex: 71 y.o., male   PCP: Kerin Perna, NP         Medical Service: Internal Medicine Teaching Service         Attending Physician: Dr. Jimmye Norman, Elaina Pattee, MD    First Contact: Dr. Sanjuana Mae Pager: SN [phone]   Second Contact: Quenten Raven  Pager: [phone]       After Hours (After 5p/  First Contact Pager: [phone]  weekends / holidays): Second Contact Pager: [phone]   SUBJECTIVE   Chief Complaint: Chest Pain  History of Present Illness:   Michael [name] is a 71 y/o person living with a history of CAD s/p CABG and PCI x2, HFmrEF, COPD, CKD IIA/IV, TIA, HIV on HAART, cervical and lumbar surgery, lower extremity weakness with frequent falls, cocaine use disorder who presents with atypical chest pain. Michael [name] had a [facility] [place] for one month where he was evaluated for angina thought to be secondary to vasospasm from his cocaine use as well as progression of his CKD. The prolonged hospital stay per chart review due to difficulty with SNF placement.He was eventually accepted to [facility] rehabilitation in [place]. Yesterday he signed himself out of the SNF and took a bus to [facility] to check on his mail since he hadn't checked while he was hospitalized. While back in [place], he smoked cocaine which led to worsening of his chest pain, shortness of breath, and left sided lower back pain. He presented to [facility] for further evaluation.   The chest pain has is left sided with radiation into his left neck, left arm, and right chest wall. It is a sharp pain that is worse with palpation and movement. It is relieved by laying flat. The pain is similar to his chronic chest pain, but is worse in intensity. He has had the similar sharp chest pain since his CABG. This chest pain was present when he was discharged from [facility] and has not been relieved.   He endorses associated symptoms of chills, chronic cough with white phlegm, diffuse abdominal pain, left sided back pain. Denies fevers, nausea, vomiting, diarrhea. Denies difficulty passing his urine or stools. He has a history of chronic back pain since his lumbar procedure. He has been adherent to his medication regimen including his HIV therapy. He endorses daily SI without a plan. He was evaluated by psychiatry during his [facility] admission who recommended continue fluoxetine. He does endorse weight loss secondary to decreased appetite, denies night sweats.   Meds:  Eliquis 5 mg BID Dolutegravir-rilpivirine 50-25 mg daily Lasix 80 mg daily Melatonin 9 mg daily Senokot 1 tab daily Renvela 800 mg daily Gabapentin 200 mg QHS Lantus 25 QHS Tylenol 1000 mg q6h PRN pain Albuterol inhaler PRN Lipitor 40 mg daily Plavix 75 mg daily Diltiazem 240 mg daily Ferrous sulfate 325 mg daily Fluoxetine 20 mg daily Advair 1 puff twice daily Imdur 120 mg daily Lidocaine patch daily Nitroglycerin as needed Protonix 40 daily Spiriva 2 puffs daily Trazodone 100 mg daily  Medications recently discontinued at his last hospitalization at [facility] on 9/21: allopurinol, Biktarvy, lispro, magnesium, metformin, Xarelto  Past Medical History CAD s/p CABG 2019 LIMA-LAD, SVG-OM2, SVG-PDA with bilateral pulmonary vein isolation and LAA clip HIV TIA HFmrEF T2DM HLD HTN Substance use disorder CKD  IIIa COPD  Past Surgical History:  Procedure Laterality Date   AMPUTATION Right 07/21/2019   Procedure: RIGHT SECOND TOE AMPUTATION;  Surgeon: [name], MD;  Location: [facility];  Service: Orthopedics;  Laterality: Right;   BACK SURGERY     1988   BOWEL RESECTION     CARDIAC SURGERY     CERVICAL SPINE SURGERY     " rods in my neck "   CORONARY ARTERY BYPASS GRAFT     CORONARY STENT PLACEMENT     NM Rhea MOTION  12/27/2011   normal   SPINE SURGERY       Social:  Currently staying at [facility], otherwise has been staying at [facility]. Per documentation does have history of being without a home.  Occupation: retired, former [employer] social research officer: His sister and son live in the area Level of Function: Uses a cane or walker to perform his ADL/IADL PCP: Juluis Mire Substances: Denies tobacco or alcohol use. Uses cocaine 1-2 times per month. No other substances.   Family History:  His father had cancer, he is unsure as to what kind.  Brother had a CABG over the age of 35 but is doing well since  Allergies: Allergies as of 07/02/2022   (No Known Allergies)   Review of Systems: A complete ROS was negative except as per HPI.   OBJECTIVE:   Physical Exam: Blood pressure (!) 147/55, pulse 98, temperature 98.5 F (36.9 C), resp. rate 17, height '5\' 8"'$  (1.727 m), weight 72.1 kg, SpO2 99 %.  Constitutional: well-appearing, no acute distress HENT: normocephalic atraumatic, mucous membranes dry Eyes: conjunctiva non-erythematous Neck: supple Cardiovascular: regular rate and rhythm, no m/r/g. Well healed midline scar. JVD base of neck. Trace lower extremity edema Pulmonary/Chest: normal work of breathing on room air, lungs clear to auscultation bilaterally Abdominal: soft, non-distended, diffuse abdominal tenderness.  MSK: normal bulk and tone. Tender to palpate left lower back Neurological: alert & oriented x 3, 5/5 strength in bilateral upper and lower extremities Skin: warm and dry. Well healed scar lumbar spine.  Psych: normal mood  Labs: CBC    Component Value Date/Time   WBC 5.3 07/02/2022 0832   RBC 3.56 (L) 07/02/2022 0832   HGB 9.8 (L) 07/02/2022 0832   HGB 11.4 (L) 12/26/2016 1233   HCT 31.5 (L) 07/02/2022 0832   HCT 34.8 (L) 12/26/2016 1233   PLT 114 (L) 07/02/2022 0832   MCV 88.5 07/02/2022 0832   MCV 82 12/26/2016 1233   MCH 27.5 07/02/2022 0832   MCHC 31.1 07/02/2022 0832   RDW 13.8 07/02/2022 0832   RDW  16.2 (H) 12/26/2016 1233   LYMPHSABS 1.2 07/02/2022 0832   LYMPHSABS 1.6 12/26/2016 1233   MONOABS 0.4 07/02/2022 0832   EOSABS 0.1 07/02/2022 0832   EOSABS 0.1 12/26/2016 1233   BASOSABS 0.0 07/02/2022 0832   BASOSABS 0.0 12/26/2016 1233     CMP     Component Value Date/Time   NA 140 07/02/2022 0832   NA 140 12/26/2016 1233   K 4.8 07/02/2022 0832   CL 108 07/02/2022 0832   CO2 19 (L) 07/02/2022 0832   GLUCOSE 216 (H) 07/02/2022 0832   BUN 68 (H) 07/02/2022 0832   BUN 15 12/26/2016 1233   CREATININE 4.22 (H) 07/02/2022 0832   CREATININE 1.97 (H) 04/25/2021 0000   CALCIUM 9.2 07/02/2022 0832   PROT 7.6 07/02/2022 0832   PROT 7.1 12/26/2016 1233   ALBUMIN 4.0 07/02/2022 1093  ALBUMIN 3.9 12/26/2016 1233   AST 22 07/02/2022 0832   ALT 14 07/02/2022 0832   ALKPHOS 94 07/02/2022 0832   BILITOT 0.9 07/02/2022 0832   BILITOT <0.2 12/26/2016 1233   GFRNONAA 14 (L) 07/02/2022 0832   GFRNONAA 69 11/02/2013 1019   GFRAA 33 (L) 12/12/2019 2207   GFRAA 80 11/02/2013 1019    Imaging:  Chest X-ray - No active cardiopulmonary disease. Prior sternotomy wires.   EKG: personally reviewed my interpretation is ventricular bigeminy with non-specific ST wave changes.  ASSESSMENT & PLAN:   Assessment & Plan by Problem: Principal Problem:   Atypical chest pain   Lydia Meng is a 71 y.o. person living with a history of CAD s/p CABG and PCI x2, HFmrEF, COPD, CKD IIA/IV, TIA, HIV on HAART, cervial and lumbar surgery, lower extremity weakness with frequent falls, cocaine use disorder who  presented with chest pain and admitted for observation for his atypical chest pain secondary to cocaine use on hospital day 0  Atypical chest pain secondary to cocaine use Hx of CAD s/p CABG 2019 and PCI 2015/2021 Presents with sharp left sided chest pain that radiates into his left arm and neck. This pain is similar to his chronic chest pain that he has had since his CABG. The pain is  reproducible and worse with positional movements.  He has had multiple admissions for exacerbation of this chest pain after cocaine use, including recent hospital admission to [facility]. He was given nitroglycerin and amlodipine in the ED with continued improvement throughout the afternoon. His troponins are mildly elevated and flat at 18,19 with no significant ST wave changes.  He has not been a candidate for PCI his past few admissions with ongoing cocaine use. Cardiology recommended if able to abstain from cocaine for 6 months then they will consider PCI. His continued chest pain and exacerbation likely vasospasm from his cocaine use. We will restart his home medicines and hopeful that he has continued improvement.  Bedside echocardiogram without wall motion abnormalities, with recent echocardiogram at [facility] last month do not believe a repeat is necessary right now. Low suspicion for ACS.  -Restart Plavix, Imdur, lipitor -Start diltiazem tomorrow -Cardiac telemetry, monitor for worsening chest pain and repeat EKG and troponins as needed  Non-oliguric AKI on CKD IIIa/IV Anion gap metabolic acidosis Microscopic hematuria/proteinuria Baseline creatinine 2 months ago 2.5-2.9. During recent hospitalization at [facility] he had worsening renal function with diuresis Cr up to 4.0 as well as proteinuria and hematuria. Nephrology was consulted and patient was RPR negative, hepatitis panel negative and with kappa/lambda ratio of 2.3. Protein electrophoresis without M spike. Renal US without hydronephrosis. There was no urgent/emergent need for dialysis and scheduled for outpatient follow up. Discharge creatinine from was 3.73. His creatinine today is 4.22.  He does not appear to be hypervolemic and has dry mucous membranes and minimal JVD. Suspect prerenal etiology from his poor p.o. intake and continued diuretic therapy. Hold diuretics in hopes of improvement of his creatinine. AGMA likely secondary to worsening renal  function.  -Hold Lasix -BMP for tomorrow -Avoid nephrotoxic medications -Phosphorus magnesium daily -Restart Renvela 800 TID  Cocaine use disorder Frequently uses cocaine to resolve chronic pain. Discussed will try medication regimen to help with this.  -treat pain with tylenol, lidocaine patch. Hold home gabapentin with AKI.  -Urine drug screen positive for amphetamines and cocaine.  -TOC consult for substance use  HFmrEF Stg C, NYHA I/II Ischemic cardiomyopathy Recent admission at [facility] for  acute heart failure exacerbation with a BNP of 401.  He had lower extremity edema and required IV diuretics. TTE LVEF 40-45%, mild global hypokinesis of left ventricle similar in appearance to prior TTE in 06/23. His Lasix was changed to p.o. and he was not restarted on a beta-blocker with his cocaine use. On exam today he has no evidence of hypervolemia and seems to be perfusing well. Bedside echocardiogram today without any acute abnormalities. BNP of 169 lower than prior admission of 401. Possibly slightly elevated with his CKD.  We will hold diuretic therapy in hopes that his creatinine is improved  -Hold IV Lasix -GDMT limited by his social situation as well as poor renal function -Strict I/O, daily weights  HIV Follows with [name] of infectious disease.  Absolute CD4 count 2 weeks ago 1102, CD4 of 410.  During admission at [facility] ID consulted with concern for continuing his Biktarvy with worsening renal function and changed to Juluca 1 tablet p.o. daily.  -Continue Juluca 1 tablet daily -Follow-up with his infectious disease physician outpatient  MSK pain Chronic back pain Remote history of cervical and lumbar surgeries Patient with history of cervical and lumbar pain after his previous surgeries. On exam there is no step off deformities, erythema, or point tenderness over midline. He does have tenderness over prior surgical incision site. He has had some weight loss from decreased  appetite, but no night sweats. Denies history of injecting substances. No difficulties passing his bowels or urinating. Do not believe he has any urgent/emergent need for imaging.  -manage pain with tylenol and lidocaine patch -follow up with PCP outpatient for consideration of imaging  Abdomianl Pain Patient with history of chronic abdominal pain for the past few years.  The pain seems to exacerbate when his chest pain also exacerbates.  Possible that he has atherosclerosis of his abdominal vessels that are having episodes of vasospasms with his cocaine use.  Do not believe he is having an acute abdomen, his abdomen is soft, he is not having pain out of proportion and he is eating and drinking well without nausea or vomiting. -Continue to discuss cocaine cessation -Continue to monitor  Frequent falls Patient states he has frequent falls, around 1 to 2/month.  He uses a walker and cane to help ambulate.  He was recently admitted to a SNF for his ongoing weakness.  On exam he has no lower extremity weakness.  We will check reversible causes with B12 and TSH have him continue to work with physical therapy.  Type 2 Diabetes Recently on metformin and transition to insulin during prior hospitalization secondary to his worsening renal function. A1c 8.3% 04/2022.  With his social situation, without consistent housing, and CKD, difficult management options at this time. Continue SSI during hospitalization and try to find optimal managment at discharge.  -SSI during his hospitalization  Hx of depression Continue fluoxetine. Chronic pain, medical conditions, continued substance use, and absence of social support likely contributing.  Has daily thoughts of suicide but no active suicidal thoughts at this time.  Hx of atrial fibrillation s/p pulmonary vein isolation & left atrial appendage clipping During [facility] admission was transitioned from xaretlo to eliquis with CKD. Today patient had bigeminy and then  converted to normal sinus rhythm.  -Continue Eliquis 5 mg twice daily and Dilt 240 mg daily  Normocytic anemia Thrombocytopenia Iron panel during prior admission normal. Anemia thought to be secondary to his HIV and CKD.  -continue to monitor -will need to make  certain up to date with colon cancer screening  Hx of COPD Patient states he has a history of COPD. No prior PFT's but does have documentation of childhood asthma. On albuterol, spiriva and advair at home.  -continue inhalers during admission  Subjective weight loss Patient endorses weight loss however on chart review his weight has maintained at 70-72kg.  -monitor weights   Diet: Renal VTE: DOAC IVF: None,None Code: Full  Prior to Admission Living Arrangement:  [place] Anticipated Discharge Location:  [place]  if they will accept him back Barriers to Discharge: Improvement in chest pain  Dispo: Admit patient to Observation with expected length of stay less than 2 midnights.  Signed: Riesa Pope, MD Internal Medicine Resident PGY-3  07/02/2022, 2:44 PM

## 2022-07-02 NOTE — ED Notes (Signed)
Provider at bedside

## 2022-07-02 NOTE — ED Triage Notes (Signed)
BIBA from SNF [facility] in [place]). Discharged from [facility] last week after month admission for kidney failure.   Yesterday took bus to [facility] in [name] where he used crack cocaine since he has c/o of CP and SOB.   EKG with EMS shows Bigeminy

## 2022-07-02 NOTE — Progress Notes (Signed)
MD placed IV ultrasound no longer needs IV team

## 2022-07-03 DIAGNOSIS — R296 Repeated falls: Secondary | ICD-10-CM | POA: Diagnosis present

## 2022-07-03 DIAGNOSIS — I498 Other specified cardiac arrhythmias: Secondary | ICD-10-CM | POA: Diagnosis present

## 2022-07-03 DIAGNOSIS — D696 Thrombocytopenia, unspecified: Secondary | ICD-10-CM | POA: Diagnosis present

## 2022-07-03 DIAGNOSIS — N1832 Chronic kidney disease, stage 3b: Secondary | ICD-10-CM | POA: Diagnosis not present

## 2022-07-03 DIAGNOSIS — R634 Abnormal weight loss: Secondary | ICD-10-CM | POA: Diagnosis present

## 2022-07-03 DIAGNOSIS — F1490 Cocaine use, unspecified, uncomplicated: Secondary | ICD-10-CM | POA: Diagnosis not present

## 2022-07-03 DIAGNOSIS — R0789 Other chest pain: Secondary | ICD-10-CM | POA: Diagnosis present

## 2022-07-03 DIAGNOSIS — E1122 Type 2 diabetes mellitus with diabetic chronic kidney disease: Secondary | ICD-10-CM | POA: Diagnosis present

## 2022-07-03 DIAGNOSIS — I255 Ischemic cardiomyopathy: Secondary | ICD-10-CM | POA: Diagnosis present

## 2022-07-03 DIAGNOSIS — I5042 Chronic combined systolic (congestive) and diastolic (congestive) heart failure: Secondary | ICD-10-CM | POA: Diagnosis present

## 2022-07-03 DIAGNOSIS — E785 Hyperlipidemia, unspecified: Secondary | ICD-10-CM | POA: Diagnosis present

## 2022-07-03 DIAGNOSIS — F32A Depression, unspecified: Secondary | ICD-10-CM | POA: Diagnosis present

## 2022-07-03 DIAGNOSIS — Z79899 Other long term (current) drug therapy: Secondary | ICD-10-CM | POA: Diagnosis not present

## 2022-07-03 DIAGNOSIS — I159 Secondary hypertension, unspecified: Secondary | ICD-10-CM | POA: Diagnosis present

## 2022-07-03 DIAGNOSIS — G8929 Other chronic pain: Secondary | ICD-10-CM | POA: Diagnosis present

## 2022-07-03 DIAGNOSIS — Y929 Unspecified place or not applicable: Secondary | ICD-10-CM | POA: Diagnosis not present

## 2022-07-03 DIAGNOSIS — Z5901 Sheltered homelessness: Secondary | ICD-10-CM | POA: Diagnosis not present

## 2022-07-03 DIAGNOSIS — Z89421 Acquired absence of other right toe(s): Secondary | ICD-10-CM | POA: Diagnosis not present

## 2022-07-03 DIAGNOSIS — N1831 Chronic kidney disease, stage 3a: Secondary | ICD-10-CM | POA: Diagnosis present

## 2022-07-03 DIAGNOSIS — N179 Acute kidney failure, unspecified: Secondary | ICD-10-CM | POA: Diagnosis present

## 2022-07-03 DIAGNOSIS — D631 Anemia in chronic kidney disease: Secondary | ICD-10-CM | POA: Diagnosis present

## 2022-07-03 DIAGNOSIS — M5412 Radiculopathy, cervical region: Secondary | ICD-10-CM | POA: Diagnosis present

## 2022-07-03 DIAGNOSIS — I4891 Unspecified atrial fibrillation: Secondary | ICD-10-CM | POA: Diagnosis present

## 2022-07-03 DIAGNOSIS — F1410 Cocaine abuse, uncomplicated: Secondary | ICD-10-CM | POA: Diagnosis present

## 2022-07-03 DIAGNOSIS — Z21 Asymptomatic human immunodeficiency virus [HIV] infection status: Secondary | ICD-10-CM | POA: Diagnosis present

## 2022-07-03 DIAGNOSIS — E872 Acidosis, unspecified: Secondary | ICD-10-CM | POA: Diagnosis present

## 2022-07-03 DIAGNOSIS — T405X1A Poisoning by cocaine, accidental (unintentional), initial encounter: Secondary | ICD-10-CM | POA: Diagnosis present

## 2022-07-03 DIAGNOSIS — Z87891 Personal history of nicotine dependence: Secondary | ICD-10-CM | POA: Diagnosis not present

## 2022-07-03 LAB — COMPREHENSIVE METABOLIC PANEL
ALT: 13 U/L (ref 0–44)
AST: 15 U/L (ref 15–41)
Albumin: 3.2 g/dL — ABNORMAL LOW (ref 3.5–5.0)
Alkaline Phosphatase: 76 U/L (ref 38–126)
Anion gap: 12 (ref 5–15)
BUN: 69 mg/dL — ABNORMAL HIGH (ref 8–23)
CO2: 23 mmol/L (ref 22–32)
Calcium: 8.7 mg/dL — ABNORMAL LOW (ref 8.9–10.3)
Chloride: 105 mmol/L (ref 98–111)
Creatinine, Ser: 4.26 mg/dL — ABNORMAL HIGH (ref 0.61–1.24)
GFR, Estimated: 14 mL/min — ABNORMAL LOW (ref >60)
Glucose, Bld: 183 mg/dL — ABNORMAL HIGH (ref 70–99)
Potassium: 4.3 mmol/L (ref 3.5–5.1)
Sodium: 140 mmol/L (ref 135–145)
Total Bilirubin: 0.8 mg/dL (ref 0.3–1.2)
Total Protein: 6.2 g/dL — ABNORMAL LOW (ref 6.5–8.1)

## 2022-07-03 LAB — GLUCOSE, CAPILLARY
Glucose-Capillary: 214 mg/dL — ABNORMAL HIGH (ref 70–99)
Glucose-Capillary: 232 mg/dL — ABNORMAL HIGH (ref 70–99)
Glucose-Capillary: 222 mg/dL — ABNORMAL HIGH (ref 70–99)
Glucose-Capillary: 212 mg/dL — ABNORMAL HIGH (ref 70–99)

## 2022-07-03 LAB — PROTEIN / CREATININE RATIO, URINE
Creatinine, Urine: 145 mg/dL
Protein Creatinine Ratio: 1.03 mg/mg{Cre} — ABNORMAL HIGH (ref 0.00–0.15)
Total Protein, Urine: 150 mg/dL

## 2022-07-03 LAB — CBC WITH DIFFERENTIAL/PLATELET
Abs Immature Granulocytes: 0.02 10*3/uL (ref 0.00–0.07)
Basophils Absolute: 0 10*3/uL (ref 0.0–0.1)
Basophils Relative: 1 %
Eosinophils Absolute: 0.2 10*3/uL (ref 0.0–0.5)
Eosinophils Relative: 5 %
HCT: 25.3 % — ABNORMAL LOW (ref 39.0–52.0)
Hemoglobin: 8.4 g/dL — ABNORMAL LOW (ref 13.0–17.0)
Immature Granulocytes: 1 %
Lymphocytes Relative: 34 %
Lymphs Abs: 1.3 10*3/uL (ref 0.7–4.0)
MCH: 27.9 pg (ref 26.0–34.0)
MCHC: 33.2 g/dL (ref 30.0–36.0)
MCV: 84.1 fL (ref 80.0–100.0)
Monocytes Absolute: 0.4 10*3/uL (ref 0.1–1.0)
Monocytes Relative: 10 %
Neutro Abs: 1.8 10*3/uL (ref 1.7–7.7)
Neutrophils Relative %: 49 %
Platelets: 123 10*3/uL — ABNORMAL LOW (ref 150–400)
RBC: 3.01 MIL/uL — ABNORMAL LOW (ref 4.22–5.81)
RDW: 13.5 % (ref 11.5–15.5)
WBC: 3.7 10*3/uL — ABNORMAL LOW (ref 4.0–10.5)
nRBC: 0 % (ref 0.0–0.2)

## 2022-07-03 LAB — MAGNESIUM: Magnesium: 1.7 mg/dL (ref 1.7–2.4)

## 2022-07-03 LAB — CK: Total CK: 167 U/L (ref 49–397)

## 2022-07-03 LAB — PHOSPHORUS: Phosphorus: 4.2 mg/dL (ref 2.5–4.6)

## 2022-07-03 LAB — TSH: TSH: 1.902 u[IU]/mL (ref 0.350–4.500)

## 2022-07-03 MED ORDER — LACTATED RINGERS IV BOLUS
500.0000 mL | Freq: Once | INTRAVENOUS | Status: AC
  Administered 2022-07-03: 500 mL via INTRAVENOUS

## 2022-07-03 MED ORDER — ISOSORBIDE MONONITRATE ER 60 MG PO TB24: 60.0000 mg | ORAL_TABLET | Freq: Every day | ORAL | Status: DC

## 2022-07-03 NOTE — Progress Notes (Addendum)
Subjective:   Summary: Michael [name] is a 71 y.o. year old male currently admitted on the IMTS HD#0 for atypical chest pain.  Overnight Events: NOE   Pt was seen today in the a.m.  He was resting comfortably in his chair.  He states his chest pain is present, and it has not been relieved.  We were able to speak in length about his current housing situation, and his use of cocaine.  He seems agreeable to changing his lifestyle habits.  Objective:  Vital signs in last 24 hours: Vitals:   07/03/22 0628 07/03/22 0800 07/03/22 0812 07/03/22 1223  BP: 100/65  97/68 95/64  Pulse: 65 67 65 69  Resp: 17   17  Temp: 98.1 F (36.7 C)   97.7 F (36.5 C)  TempSrc: Oral   Oral  SpO2: 100% 100% 100% 100%  Weight:      Height:       Supplemental O2: Room Air SpO2: 100 %   Physical Exam:  Constitutional: Elderly gentleman, poor dentition, resting comfortably in his chair, in no acute distress Cardiovascular: RRR, no murmurs, rubs or gallops Pulmonary/Chest: normal work of breathing on room air, lungs clear to auscultation bilaterally Abdominal: soft, non-tender, non-distended Skin: warm and dry Extremities: upper/lower extremity pulses 2+, no lower extremity edema present  [facility] Weights   07/02/22 0707 07/02/22 1804  Weight: 72.1 kg 72 kg     Intake/Output Summary (Last 24 hours) at 07/03/2022 1308 Last data filed at 07/03/2022 1038 Gross per 24 hour  Intake --  Output 2210 ml  Net -2210 ml   Net IO Since Admission: -2,510 mL [07/03/22 1308]  Pertinent Labs:    Latest Ref Rng & Units 07/03/2022    3:31 AM 07/02/2022    8:32 AM 11/20/2021    8:44 AM  CBC  WBC 4.0 - 10.5 K/uL 3.7  5.3  4.5   Hemoglobin 13.0 - 17.0 g/dL 8.4  9.8  9.3   Hematocrit 39.0 - 52.0 % 25.3  31.5  28.7   Platelets 150 - 400 K/uL 123  114  132        Latest Ref Rng & Units 07/03/2022    3:31 AM 07/02/2022    8:32 AM 11/20/2021    8:44 AM  CMP  Glucose 70 - 99 mg/dL 183  216   204   BUN 8 - 23 mg/dL 69  68  39   Creatinine 0.61 - 1.24 mg/dL 4.26  4.22  2.43   Sodium 135 - 145 mmol/L 140  140  136   Potassium 3.5 - 5.1 mmol/L 4.3  4.8  3.9   Chloride 98 - 111 mmol/L 105  108  108   CO2 22 - 32 mmol/L '23  19  20   ' Calcium 8.9 - 10.3 mg/dL 8.7  9.2  8.6   Total Protein 6.5 - 8.1 g/dL 6.2  7.6  5.9   Total Bilirubin 0.3 - 1.2 mg/dL 0.8  0.9  0.3   Alkaline Phos 38 - 126 U/L 76  94  65   AST 15 - 41 U/L '15  22  17   ' ALT 0 - 44 U/L '13  14  16      ' Assessment/Plan:   Principal Problem:   Atypical chest pain   Patient Summary: Michael [name] is a 71 y.o. with a pertinent PMH of  CAD status post CABG and PCI x2, heart failure with mildly reduced ejection fraction, COPD, CKD 3A/4, TIA, HIV on HAART cocaine use disorder, lower extremity weakness with frequent falls, who presented with chest pain and admitted for atypical chest pain secondary to cocaine use.    #Atypical chest pain secondary to cocaine use #History of coronary artery disease status post CABG 2019 and PCI 2015/2021 Patient initially presented with sharp left-sided chest pain that radiated to his left arm and neck.  He has had chest pain since 2019 after his CABG, however recently yesterday used cocaine which made the chest pain worse, which is why presented to the emergency department.  No concern for STEMI or NSTEMI at this time due to normal EKG, and troponins being flat.  Due to his cocaine use, not a good candidate for PCI.  Needs to be in at least 68-monthwithdrawal of cocaine for PCI consideration.  On exam today patient was resting comfortably, but was still complaining of chest pain and shortness of breath.  Physical therapy has worked with him, and he states that he would be winded a few steps down the hallway.   Plan: - Continue Plavix, Lipitor - Continue diltiazem - Continue cardiac telemetry  #AKI on CKD 3A/4 Patient's baseline creatinine is usually 2.5-2.9, labs this morning  revealed a creatinine of 4.26. AKI is likely prerenal due to excess diuresis (BUN is elevated, mucous membraines dry at admission) with possible contribution of vasospasm caused by cocaine use. Pt states he has no trouble urinating.   Plan:  - Encourage patient to take PO fluids.  - Daily BMPs to evaluate  #Safe Disposition Planning # Cocaine Use Disorder Had long discussion about patient's current state outside of the hospital. [facility] for the same symptoms, however placement of this patient was difficult due to drug use. [facility], and ended up using cocaine, which led to his vasospasm and admission. Was able to discuss this pattern, and patient is not oblivious to this either. He does not want to stay with family due to embarrassment of his drug use and lifestyle. SNF is unlikely to accept due to recent drug use, [facility], tough situation and right now is not sure where he would go after hospital admission. He has a sister in [place], and family spread out all over , but none are willing to allow him to stay with him.   #HFmrEF Stg C, NYHA I/II #Ischemic Cardiomyopathy Patient's left ventricular ejection fraction is 40 to 45%, with mild global hypokinesis of the left ventricle.  He was previously on Lasix 80 mg twice daily orally.  On exam today, he is euvolemic, with no lower extremity edema, no JVD appreciated.  Continue holding diuretic therapy in the setting of AKI.  This, together with stopping Imdur, should help with soft BPs.  Plan: - Continue holding Lasix - Strict I's/O, daily weights   Diet: Heart Healthy Code: Full  Dispo: Anticipated discharge in less than two midnights.   SDrucie Opitz MD PGY-1 Internal Medicine  Resident Pager Number [phone] contact the on call pager after 5 pm and on weekends at [phone]

## 2022-07-03 NOTE — Evaluation (Signed)
Physical Therapy Evaluation Patient Details Name: Michael [name] MRN: [id number] DOB: 1951/01/16 Today's Date: 07/03/2022  History of Present Illness  71 y.o. male presents to [facility] hospital on 07/02/2022 with atypical chest pain after recently using cocaine. PMH includes CAD s/p CABG, CHF, COPD, CKD, TIA, HIV, recurrent falls.  Clinical Impression  Pt presents to PT with deficits in endurance, power, gait, functional mobility. Pt is able to ambulate and transfer without physical assistance at this time, utilizing a RW for support. Pt reports not having a home or any caregiver support currently, [place] streets [place]. Pt requests assistance in finding housing, case manager made aware. If housing is able to be secured PT recommends discharge home with HHPT, however if housing is unable to be obtained then SNF would be recommended to continue improving endurance and strength as the pt would need to be able to ambulate for community distances and perform floor transfers independently.     Recommendations for follow up therapy are one component of a multi-disciplinary discharge planning process, led by the attending physician.  Recommendations may be updated based on patient status, additional functional criteria and insurance authorization.  Follow Up Recommendations Home health PT (if housing is able to be obtained)      Assistance Recommended at Discharge Intermittent Supervision/Assistance  Patient can return home with the following  A little help with walking and/or transfers;A little help with bathing/dressing/bathroom;Assistance with cooking/housework;Assist for transportation;Help with stairs or ramp for entrance;Direct supervision/assist for medications management    Equipment Recommendations None recommended by PT  Recommendations for Other Services       Functional Status Assessment Patient has had a recent decline in their functional  status and demonstrates the ability to make significant improvements in function in a reasonable and predictable amount of time.     Precautions / Restrictions Precautions Precautions: Fall Precaution Comments: history of falls Restrictions Weight Bearing Restrictions: No      Mobility  Bed Mobility Overal bed mobility: Modified Independent                  Transfers Overall transfer level: Needs assistance Equipment used: Rolling walker (2 wheels) Transfers: Sit to/from Stand Sit to Stand: Supervision           General transfer comment: increased time from lower surface    Ambulation/Gait Ambulation/Gait assistance: Supervision Gait Distance (Feet): 120 Feet Assistive device: Rolling walker (2 wheels) Gait Pattern/deviations: Step-through pattern Gait velocity: reduced Gait velocity interpretation: <1.31 ft/sec, indicative of household ambulator   General Gait Details: slowed step-through gait  Stairs            Wheelchair Mobility    Modified Rankin (Stroke Patients Only)       Balance Overall balance assessment: Needs assistance Sitting-balance support: No upper extremity supported, Feet supported Sitting balance-Leahy Scale: Good     Standing balance support: Bilateral upper extremity supported, Reliant on assistive device for balance Standing balance-Leahy Scale: Poor                               Pertinent Vitals/Pain Pain Assessment Pain Assessment: 0-10 Pain Score: 7  Pain Location: chest Pain Descriptors / Indicators: Tightness, Sharp Pain Intervention(s): Monitored during session    Home Living Family/patient expects to be discharged to:: Shelter/Homeless  Additional Comments: pt asks about support for finding housing. Pt is unable to identify any caregiver support at this time.    Prior Function Prior Level of Function : Independent/Modified Independent;History of Falls (last six  months)             Mobility Comments: ambulatory with use of RW       Hand Dominance   Dominant Hand: Right    Extremity/Trunk Assessment   Upper Extremity Assessment Upper Extremity Assessment: Overall WFL for tasks assessed    Lower Extremity Assessment Lower Extremity Assessment: Generalized weakness    Cervical / Trunk Assessment Cervical / Trunk Assessment: Kyphotic  Communication   Communication: No difficulties  Cognition Arousal/Alertness: Awake/alert Behavior During Therapy: WFL for tasks assessed/performed Overall Cognitive Status: Within Functional Limits for tasks assessed                                          General Comments General comments (skin integrity, edema, etc.): VSS on RA    Exercises     Assessment/Plan    PT Assessment Patient needs continued PT services  PT Problem List Decreased strength;Decreased balance;Decreased activity tolerance;Decreased mobility;Decreased knowledge of use of DME;Cardiopulmonary status limiting activity;Pain       PT Treatment Interventions DME instruction;Gait training;Functional mobility training;Therapeutic activities;Therapeutic exercise;Balance training;Neuromuscular re-education;Patient/family education;Stair training    PT Goals (Current goals can be found in the Care Plan section)  Acute Rehab PT Goals Patient Stated Goal: to find housing and continue therapy PT Goal Formulation: With patient Time For Goal Achievement: 07/17/22 Potential to Achieve Goals: Fair Additional Goals Additional Goal #1: Pt will perform a floor transfer at a modI level to demonstrate the ability to get down onto and up from the ground independently.    Frequency Min 3X/week     Co-evaluation               AM-PAC PT "6 Clicks" Mobility  Outcome Measure Help needed turning from your back to your side while in a flat bed without using bedrails?: None Help needed moving from lying on your back  to sitting on the side of a flat bed without using bedrails?: None Help needed moving to and from a bed to a chair (including a wheelchair)?: A Little Help needed standing up from a chair using your arms (e.g., wheelchair or bedside chair)?: A Little Help needed to walk in hospital room?: A Little Help needed climbing 3-5 steps with a railing? : A Little 6 Click Score: 20    End of Session   Activity Tolerance: Patient tolerated treatment well Patient left: in chair;with call bell/phone within reach;with chair alarm set Nurse Communication: Mobility status PT Visit Diagnosis: Other abnormalities of gait and mobility (R26.89);Muscle weakness (generalized) (M62.81);History of falling (Z91.81)    Time: 8366-2947 PT Time Calculation (min) (ACUTE ONLY): 23 min   Charges:   PT Evaluation $PT Eval Low Complexity: [place], PT, DPT Acute Rehabilitation Office [phone]   Zenaida Niece 07/03/2022, 9:23 AM

## 2022-07-03 NOTE — TOC Initial Note (Signed)
Transition of Care [facility]) - Initial/Assessment Note    Patient Details  Name: Michael [name] MRN: [id number] Date of Birth: 29-May-1951  [facility] CM/SW Contact:    Milas Gain, [place] Phone Number: 07/03/2022, 3:44 PM  Clinical Narrative:                  Patient reports he is homeless. CSW offered patient outpatient substance use treatment services resources as well as area shelter resources.Patient accepted. Patient is unsure of his dc plan. Patient reports he has no family or friends that he can stay at when medically ready for discharge.  Had a discussion in reference to ALF unable to to pursue do to insurance and substance use. All questions answered. No further questions reported at this time. CSW informed CM and MD.        Patient Goals and CMS Choice        Expected Discharge Plan and Services                                                Prior Living Arrangements/Services                       Activities of Daily Living      Permission Sought/Granted                  Emotional Assessment              Admission diagnosis:  [place] [I49.8] Atypical chest pain [R07.89] AKI (acute kidney injury) ([place]) [N17.9] Secondary hypertension [I15.9] Chest pain, unspecified type [R07.9] Patient Active Problem List   Diagnosis Date Noted   Impaired functional mobility, balance, and endurance 11/29/2021   Transfusion history 11/29/2021   Unstable balance 11/29/2021   Dilated cardiomyopathy ([place]) 11/29/2021   Arthritis 11/29/2021   Diabetic foot infection ([place]) 06/20/2021   Presence of aortocoronary bypass graft 04/16/2021   Iron deficiency anemia 04/04/2021   Atypical chest pain 04/03/2021   Anemia of chronic disease 12/14/2020   Uncontrolled type 2 diabetes mellitus with hyperglycemia ([place]) 08/14/2020   Idiopathic chronic gout of multiple sites without tophus 02/17/2020   Diabetic ulcer of toe of right foot  associated with type 2 diabetes mellitus ([place]) 12/17/2019   Vitamin B12 deficiency 12/17/2019   Homelessness 12/13/2019   Cellulitis 12/07/2019   Osteomyelitis ([place]) 07/17/2019   Abscess or cellulitis of toe, right    Toe pain, right-second    MDD (major depressive disorder), recurrent episode, severe ([place]) 06/15/2019   Respiratory distress 04/12/2019   Pneumonia due to severe acute respiratory syndrome coronavirus 2 (SARS-CoV-2) 04/11/2019   COVID-19 virus infection 03/19/2019   Chest pain 10/03/2018   Malnutrition of moderate degree 09/21/2018   MDD (major depressive disorder), recurrent episode, moderate ([name])    Type II diabetes mellitus with renal manifestations ([place]) 05/04/2018   GERD (gastroesophageal reflux disease) 05/04/2018   S/P CABG (coronary artery bypass graft)    Left testicular pain    Depression 02/20/2018   Epididymo-orchitis, acute 02/20/2018   Essential hypertension 02/20/2018   Precordial chest pain 02/20/2018   AKI (acute kidney injury) ([place]) 02/14/2018   HCAP (healthcare-associated pneumonia) 02/14/2018   History of pulmonary embolism 07/04/2017   Acute hyponatremia 05/11/2017   Hyperglycemia due to type 2 diabetes mellitus ([place]) 05/11/2017   CHF (congestive  heart failure) ([place]) 04/01/2017   Hepatitis C 12/30/2016   Substance induced mood disorder ([place]) 11/23/2016   Chronic ischemic heart disease 11/12/2016   Paroxysmal A-fib ([place]) 11/12/2016   Back pain 04/18/2016   S/P carotid endarterectomy 11/15/2015   Chronic obstructive pulmonary disease, unspecified ([place]) 10/22/2015   Stenosis of cervical spine with myelopathy ([place]) 10/20/2015   MDD (major depressive disorder), recurrent severe, without psychosis ([place]) 09/09/2015   History of fusion of cervical spine 08/28/2015   Cocaine-induced mood disorder ([place]) 08/14/2015   Cocaine abuse with cocaine-induced mood disorder ([place]) 08/14/2015   Gout 07/10/2015   Acute renal failure superimposed on stage 3  chronic kidney disease ([place]) 03/06/2015   Anemia 03/06/2015   Chronic kidney disease, stage III (moderate) ([place]) 03/06/2015   Hypoglycemia    Encounter for general adult medical examination with abnormal findings 02/09/2015   Cocaine use disorder, moderate, dependence ([place]) 12/13/2014   Substance or medication-induced depressive disorder with onset during withdrawal ([place]) 12/13/2014   Severe recurrent major depressive disorder with psychotic features ([place]) 12/12/2014   Major depressive disorder, recurrent, severe without psychotic features ([place])    Cervicalgia 06/28/2014   Lumbar radiculopathy, chronic 06/28/2014   Asthma, chronic 02/03/2014   S/P percutaneous transluminal coronary angioplasty 10/15/2013   3-vessel CAD 06/24/2013   ED (erectile dysfunction) of organic origin 07/07/2012   Cocaine abuse, continuous ([place]) 05/13/2012   Hypertension goal BP (blood pressure) < 140/80 04/29/2012   Chondromalacia of left knee 03/19/2012   Hyperlipidemia with target LDL less than 100 02/12/2012   Fibromyalgia 02/12/2012   Cocaine use disorder ([place]) 01/10/2012    Class: Acute   HIV (human immunodeficiency virus infection) ([place]) 08/27/2011   Uncontrolled type 2 diabetes with neuropathy 10/17/2000   PCP:  Kerin Perna, NP Pharmacy:   [name] [address] [place] [address] Phone: [phone] Fax: [facility] [address] [place] [address] Phone: [phone] Fax: [place], [place] - [address] [place] [address] Phone: [phone] Fax: [phone]     Social Determinants of Health ([place]) Interventions    Readmission Risk Interventions    12/09/2019    1:46 PM  Readmission Risk Prevention Plan  Transportation Screening Complete  Medication Review (RN Care Manager) Complete  PCP or Specialist appointment  within 3-5 days of discharge Complete  HRI or [place] Complete  SW Recovery Care/Counseling Consult Complete  [place] Not Applicable

## 2022-07-03 NOTE — Plan of Care (Signed)

## 2022-07-04 DIAGNOSIS — F1490 Cocaine use, unspecified, uncomplicated: Secondary | ICD-10-CM | POA: Diagnosis not present

## 2022-07-04 DIAGNOSIS — R0789 Other chest pain: Secondary | ICD-10-CM | POA: Diagnosis not present

## 2022-07-04 LAB — BASIC METABOLIC PANEL
Anion gap: 11 (ref 5–15)
BUN: 76 mg/dL — ABNORMAL HIGH (ref 8–23)
CO2: 22 mmol/L (ref 22–32)
Calcium: 8.5 mg/dL — ABNORMAL LOW (ref 8.9–10.3)
Chloride: 103 mmol/L (ref 98–111)
Creatinine, Ser: 4.54 mg/dL — ABNORMAL HIGH (ref 0.61–1.24)
GFR, Estimated: 13 mL/min — ABNORMAL LOW (ref >60)
Glucose, Bld: 198 mg/dL — ABNORMAL HIGH (ref 70–99)
Potassium: 4 mmol/L (ref 3.5–5.1)
Sodium: 136 mmol/L (ref 135–145)

## 2022-07-04 LAB — MAGNESIUM: Magnesium: 1.8 mg/dL (ref 1.7–2.4)

## 2022-07-04 LAB — CBC
HCT: 24.5 % — ABNORMAL LOW (ref 39.0–52.0)
Hemoglobin: 8.3 g/dL — ABNORMAL LOW (ref 13.0–17.0)
MCH: 28.1 pg (ref 26.0–34.0)
MCHC: 33.9 g/dL (ref 30.0–36.0)
MCV: 83.1 fL (ref 80.0–100.0)
Platelets: 110 10*3/uL — ABNORMAL LOW (ref 150–400)
RBC: 2.95 MIL/uL — ABNORMAL LOW (ref 4.22–5.81)
RDW: 13.2 % (ref 11.5–15.5)
WBC: 3.9 10*3/uL — ABNORMAL LOW (ref 4.0–10.5)
nRBC: 0 % (ref 0.0–0.2)

## 2022-07-04 LAB — GLUCOSE, CAPILLARY
Glucose-Capillary: 197 mg/dL — ABNORMAL HIGH (ref 70–99)
Glucose-Capillary: 165 mg/dL — ABNORMAL HIGH (ref 70–99)
Glucose-Capillary: 213 mg/dL — ABNORMAL HIGH (ref 70–99)
Glucose-Capillary: 232 mg/dL — ABNORMAL HIGH (ref 70–99)

## 2022-07-04 LAB — PHOSPHORUS: Phosphorus: 3.8 mg/dL (ref 2.5–4.6)

## 2022-07-04 MED ORDER — LACTATED RINGERS IV BOLUS
1000.0000 mL | Freq: Once | INTRAVENOUS | Status: AC
  Administered 2022-07-04: 1000 mL via INTRAVENOUS

## 2022-07-04 MED ORDER — INSULIN GLARGINE-YFGN 100 UNIT/ML ~~LOC~~ SOLN
5.0000 [IU] | Freq: Every day | SUBCUTANEOUS | Status: DC
  Administered 2022-07-04: 5 [IU] via SUBCUTANEOUS
  Filled 2022-07-04 (×3): qty 0.05

## 2022-07-04 MED ORDER — GABAPENTIN 600 MG PO TABS
300.0000 mg | ORAL_TABLET | Freq: Every day | ORAL | Status: DC
  Administered 2022-07-04 – 2022-07-07 (×4): 300 mg via ORAL
  Filled 2022-07-04 (×4): qty 1

## 2022-07-04 NOTE — Plan of Care (Signed)

## 2022-07-04 NOTE — Progress Notes (Signed)
Subjective:   Summary: Michael [name] is a 71 y.o. year old male currently admitted on the IMTS HD#1 for atypical chest pain.  Overnight Events: No overnight events  Patient seen this morning.  He was resting comfortably in his bed.  He is complaining of pain starting in his neck and radiating down the left arm and left leg.  He states this is not new, and has been bothering him for quite a while now.  He has been unable to contact his family.  Objective:  Vital signs in last 24 hours: Vitals:   07/04/22 0345 07/04/22 0748 07/04/22 0836 07/04/22 0946  BP: 131/74  128/64 (!) 144/71  Pulse: 64  63   Resp: 15  15   Temp: 97.9 F (36.6 C)  98.7 F (37.1 C)   TempSrc: Oral  Oral   SpO2: 100% 99% 99%   Weight:      Height:       Supplemental O2: Room Air SpO2: 99 %   Physical Exam:  Constitutional: Elderly gentleman, poor dentition, resting comfortably in bed, in no acute distress Cardiovascular: RRR, no murmurs, rubs or gallops Pulmonary/Chest: normal work of breathing on room air, lungs clear to auscultation bilaterally Abdominal: soft, non-tender, non-distended Skin: warm and dry Extremities: upper/lower extremity pulses 2+, no lower extremity edema present  [facility] Weights   07/02/22 0707 07/02/22 1804  Weight: 72.1 kg 72 kg     Intake/Output Summary (Last 24 hours) at 07/04/2022 1438 Last data filed at 07/04/2022 1226 Gross per 24 hour  Intake 580 ml  Output 2500 ml  Net -1920 ml   Net IO Since Admission: -3,830 mL [07/04/22 1438]  Pertinent Labs:    Latest Ref Rng & Units 07/04/2022    2:41 AM 07/03/2022    3:31 AM 07/02/2022    8:32 AM  CBC  WBC 4.0 - 10.5 K/uL 3.9  3.7  5.3   Hemoglobin 13.0 - 17.0 g/dL 8.3  8.4  9.8   Hematocrit 39.0 - 52.0 % 24.5  25.3  31.5   Platelets 150 - 400 K/uL 110  123  114        Latest Ref Rng & Units 07/04/2022    2:41 AM 07/03/2022    3:31 AM 07/02/2022    8:32 AM  CMP  Glucose 70 - 99 mg/dL 198   183  216   BUN 8 - 23 mg/dL 76  69  68   Creatinine 0.61 - 1.24 mg/dL 4.54  4.26  4.22   Sodium 135 - 145 mmol/L 136  140  140   Potassium 3.5 - 5.1 mmol/L 4.0  4.3  4.8   Chloride 98 - 111 mmol/L 103  105  108   CO2 22 - 32 mmol/L '22  23  19   '$ Calcium 8.9 - 10.3 mg/dL 8.5  8.7  9.2   Total Protein 6.5 - 8.1 g/dL  6.2  7.6   Total Bilirubin 0.3 - 1.2 mg/dL  0.8  0.9   Alkaline Phos 38 - 126 U/L  76  94   AST 15 - 41 U/L  15  22   ALT 0 - 44 U/L  13  14      Assessment/Plan:   Principal Problem:   Atypical chest pain   Patient Summary: Michael [name] is a 71 y.o. with a pertinent PMH of CAD status  post CABG and PCI x2, heart failure with mildly reduced ejection fraction, COPD, CKD 3A, TIA, HIV on [place] therapy, cocaine use disorder, who presented with chest pain and admitted for atypical chest pain secondary to cocaine use, as well as AKI.    #Atypical chest pain secondary to cocaine use #History of coronary artery disease status post CABG 2019 and PCI 2015/2021 Still low concern for STEMI or NSTEMI at this point.  Patient says chest pain is still there, however is not as bad as it was before on upon admission.  Physical therapy and mobility specialist are continuing to work with him.  Shortness of breath is improved as well. Plan: - Continue Plavix, Lipitor - Continue diltiazem - Continue cardiac telemetry  #AKI on CKD 3A Patient's baseline creatinine is usually 2.5-2.9, recent labs showed creatinine level of 4.54.  He is currently on a saline drip, and was given a 500 mL lactated ringer bolus last night.  Emphasized the importance of p.o. fluid intake.  He denies any trouble urinating at this moment.  Bladder scan showed postvoid volume of 114. Plan: - Continue to encourage patient to take p.o. fluids - Daily BMPs to evaluate kidney function -Patient's net urine output for the entirety of his hospital course is -3830 - We will monitor patient's kidney function, and  evaluate if this is a prerenal AKI based off of fluids that were giving him.  If creatinine continues to get worse we may need to consider renal ultrasound, and other etiologies of his AKI  #Safe disposition planning #Cocaine use disorder Continued conversation about disposition with patient today, social work came by to discuss options with patient.  They gave him phone number for some shelters which could potentially house him, however he has not been able to get in contact with them at this moment.  Patient states he also tried calling his family, however they have not been answering his phone calls.  Unlikely a SNF candidate due to cocaine use.  #HFmrEF Stg C, NYHA I/II #Ischemic Cardiomyopathy Patient's left ventricular ejection fraction is 40 to 45%, with mild global hypokinesis of the left ventricle.  Exam continues to show euvolemic status with no lower extremity edema, no JVD appreciated.  We will continue to hold the diuretics in the setting of an AKI.  Plan: - Continue holding Lasix - Strict I's/O's, daily weights  Code: Full    Dispo: Anticipated discharge in less than two midnights  Drucie Opitz, MD PGY-1 Internal Medicine Resident Pager Number [phone] Please contact the on call pager after 5 pm and on weekends at [phone].

## 2022-07-04 NOTE — Progress Notes (Signed)
Mobility Specialist - Progress Note   07/04/22 1135  Mobility  Activity Refused mobility   Pt refused mobility stating that he did not feel up to ir right now.Will follow up if time permits.   Larey Seat

## 2022-07-04 NOTE — Inpatient Diabetes Management (Signed)
Inpatient Diabetes Program Recommendations  AACE/ADA: New Consensus Statement on Inpatient Glycemic Control  Target Ranges:  Prepandial:   less than 140 mg/dL      Peak postprandial:   less than 180 mg/dL (1-2 hours)      Critically ill patients:  140 - 180 mg/dL    Latest Reference Range & Units 07/03/22 08:49 07/03/22 12:20 07/03/22 16:27 07/03/22 21:17 07/04/22 08:34  Glucose-Capillary 70 - 99 mg/dL 214 (H) 232 (H) 222 (H) 212 (H) 197 (H)   Review of Glycemic Control  Diabetes history: DM2 Outpatient Diabetes medications: Lantus 30 units daily, Humalog 4 units TID with meals Current orders for Inpatient glycemic control: Novolog 0-9 units TID with meals  Inpatient Diabetes Program Recommendations:    Insulin: Please consider ordering Semglee 7 units Q24H.  Thanks, Barnie Alderman, RN, MSN, [place] Diabetes Coordinator Inpatient Diabetes Program [phone] (Team Pager from 8am to [place])

## 2022-07-05 ENCOUNTER — Other Ambulatory Visit (HOSPITAL_COMMUNITY): Payer: Self-pay

## 2022-07-05 LAB — BASIC METABOLIC PANEL
Anion gap: 9 (ref 5–15)
BUN: 60 mg/dL — ABNORMAL HIGH (ref 8–23)
CO2: 22 mmol/L (ref 22–32)
Calcium: 8.7 mg/dL — ABNORMAL LOW (ref 8.9–10.3)
Chloride: 109 mmol/L (ref 98–111)
Creatinine, Ser: 3.79 mg/dL — ABNORMAL HIGH (ref 0.61–1.24)
GFR, Estimated: 16 mL/min — ABNORMAL LOW (ref >60)
Glucose, Bld: 176 mg/dL — ABNORMAL HIGH (ref 70–99)
Potassium: 4.1 mmol/L (ref 3.5–5.1)
Sodium: 140 mmol/L (ref 135–145)

## 2022-07-05 LAB — CBC
HCT: 26 % — ABNORMAL LOW (ref 39.0–52.0)
Hemoglobin: 8.6 g/dL — ABNORMAL LOW (ref 13.0–17.0)
MCH: 27.7 pg (ref 26.0–34.0)
MCHC: 33.1 g/dL (ref 30.0–36.0)
MCV: 83.6 fL (ref 80.0–100.0)
Platelets: 114 10*3/uL — ABNORMAL LOW (ref 150–400)
RBC: 3.11 MIL/uL — ABNORMAL LOW (ref 4.22–5.81)
RDW: 13.2 % (ref 11.5–15.5)
WBC: 3.7 10*3/uL — ABNORMAL LOW (ref 4.0–10.5)
nRBC: 0 % (ref 0.0–0.2)

## 2022-07-05 LAB — GLUCOSE, CAPILLARY
Glucose-Capillary: 171 mg/dL — ABNORMAL HIGH (ref 70–99)
Glucose-Capillary: 194 mg/dL — ABNORMAL HIGH (ref 70–99)
Glucose-Capillary: 204 mg/dL — ABNORMAL HIGH (ref 70–99)
Glucose-Capillary: 155 mg/dL — ABNORMAL HIGH (ref 70–99)

## 2022-07-05 LAB — IRON AND TIBC
Iron: 59 ug/dL (ref 45–182)
Saturation Ratios: 18 % (ref 17.9–39.5)
TIBC: 330 ug/dL (ref 250–450)
UIBC: 271 ug/dL

## 2022-07-05 LAB — FERRITIN: Ferritin: 95 ng/mL (ref 24–336)

## 2022-07-05 MED ORDER — AMLODIPINE BESYLATE 10 MG PO TABS
10.0000 mg | ORAL_TABLET | Freq: Every day | ORAL | 0 refills | Status: DC
  Filled 2022-07-05: qty 90, 90d supply, fill #0

## 2022-07-05 MED ORDER — AMLODIPINE BESYLATE 10 MG PO TABS
10.0000 mg | ORAL_TABLET | Freq: Every day | ORAL | 11 refills | Status: DC
  Filled 2022-07-05: qty 30, 30d supply, fill #0

## 2022-07-05 MED ORDER — DILTIAZEM HCL ER COATED BEADS 240 MG PO CP24
240.0000 mg | ORAL_CAPSULE | Freq: Every day | ORAL | 0 refills | Status: DC
  Filled 2022-07-05: qty 90, 90d supply, fill #0

## 2022-07-05 MED ORDER — LANTUS SOLOSTAR 100 UNIT/ML ~~LOC~~ SOPN
6.0000 [IU] | PEN_INJECTOR | Freq: Every day | SUBCUTANEOUS | 3 refills | Status: DC
  Filled 2022-07-05: qty 3, 50d supply, fill #0

## 2022-07-05 MED ORDER — APIXABAN 5 MG PO TABS
5.0000 mg | ORAL_TABLET | Freq: Two times a day (BID) | ORAL | 0 refills | Status: DC
  Filled 2022-07-05: qty 60, 30d supply, fill #0

## 2022-07-05 MED ORDER — SEMAGLUTIDE(0.25 OR 0.5MG/DOS) 2 MG/3ML ~~LOC~~ SOPN
0.5000 mg | PEN_INJECTOR | SUBCUTANEOUS | 0 refills | Status: DC
  Filled 2022-07-05: qty 3, fill #0

## 2022-07-05 MED ORDER — AMLODIPINE BESYLATE 10 MG PO TABS
10.0000 mg | ORAL_TABLET | Freq: Every day | ORAL | Status: DC
  Administered 2022-07-05 – 2022-07-08 (×4): 10 mg via ORAL
  Filled 2022-07-05 (×4): qty 1

## 2022-07-05 MED ORDER — NITROGLYCERIN 0.4 MG SL SUBL
SUBLINGUAL_TABLET | SUBLINGUAL | Status: AC
  Administered 2022-07-05: 0.4 mg
  Filled 2022-07-05: qty 1

## 2022-07-05 MED ORDER — INSULIN ASPART 100 UNIT/ML IJ SOLN
2.0000 [IU] | Freq: Three times a day (TID) | INTRAMUSCULAR | Status: DC
  Administered 2022-07-05 – 2022-07-08 (×10): 2 [IU] via SUBCUTANEOUS

## 2022-07-05 MED ORDER — INSULIN GLARGINE-YFGN 100 UNIT/ML ~~LOC~~ SOLN
6.0000 [IU] | Freq: Every day | SUBCUTANEOUS | Status: DC
  Administered 2022-07-05 – 2022-07-07 (×3): 6 [IU] via SUBCUTANEOUS
  Filled 2022-07-05 (×4): qty 0.06

## 2022-07-05 MED ORDER — DOLUTEGRAVIR-RILPIVIRINE 50-25 MG PO TABS
1.0000 | ORAL_TABLET | Freq: Every day | ORAL | 0 refills | Status: DC
  Filled 2022-07-05: qty 30, 30d supply, fill #0

## 2022-07-05 MED ORDER — GABAPENTIN 300 MG PO CAPS
300.0000 mg | ORAL_CAPSULE | Freq: Every day | ORAL | 0 refills | Status: DC
  Filled 2022-07-05: qty 60, 60d supply, fill #0

## 2022-07-05 MED ORDER — CLOPIDOGREL BISULFATE 75 MG PO TABS
75.0000 mg | ORAL_TABLET | Freq: Every day | ORAL | 0 refills | Status: DC
  Filled 2022-07-05: qty 60, 60d supply, fill #0

## 2022-07-05 NOTE — Progress Notes (Signed)
Subjective:   Summary: Michael [name] is a 70 y.o. year old male currently admitted on the IMTS HD#2 for atypical chest pain and AKI.  Overnight Events: No overnight events  Patient was seen this morning.  He was resting comfortably in his bed.  We were able to discuss his disposition plans.  He was complaining of the arm pain radiating from his neck.  Objective:  Vital signs in last 24 hours: Vitals:   07/04/22 2009 07/05/22 0323 07/05/22 0735 07/05/22 0825  BP: (!) 143/74 (!) 164/92  138/87  Pulse: 64 63  60  Resp: '16 16  16  '$ Temp: 98.4 F (36.9 C) 98.4 F (36.9 C)  98.3 F (36.8 C)  TempSrc: Oral Oral  Oral  SpO2: 99% 98% 99% 99%  Weight:      Height:       Supplemental O2: Room Air SpO2: 99 %   Physical Exam:  Constitutional: Elderly gentleman, poor dentition, resting comfortably in bed in no acute distress Cardiovascular: RRR, no murmurs, rubs or gallops Pulmonary/Chest: normal work of breathing on room air, lungs clear to auscultation bilaterally Abdominal: soft, non-tender, non-distended Skin: warm and dry Extremities: upper/lower extremity pulses 2+, no lower extremity edema present  [facility] Weights   07/02/22 0707 07/02/22 1804  Weight: 72.1 kg 72 kg     Intake/Output Summary (Last 24 hours) at 07/05/2022 1123 Last data filed at 07/05/2022 0826 Gross per 24 hour  Intake 1600 ml  Output 1075 ml  Net 525 ml   Net IO Since Admission: -2,245 mL [07/05/22 1123]  Pertinent Labs:    Latest Ref Rng & Units 07/05/2022    1:44 AM 07/04/2022    2:41 AM 07/03/2022    3:31 AM  CBC  WBC 4.0 - 10.5 K/uL 3.7  3.9  3.7   Hemoglobin 13.0 - 17.0 g/dL 8.6  8.3  8.4   Hematocrit 39.0 - 52.0 % 26.0  24.5  25.3   Platelets 150 - 400 K/uL 114  110  123        Latest Ref Rng & Units 07/05/2022    1:44 AM 07/04/2022    2:41 AM 07/03/2022    3:31 AM  CMP  Glucose 70 - 99 mg/dL 176  198  183   BUN 8 - 23 mg/dL 60  76  69   Creatinine 0.61 - 1.24  mg/dL 3.79  4.54  4.26   Sodium 135 - 145 mmol/L 140  136  140   Potassium 3.5 - 5.1 mmol/L 4.1  4.0  4.3   Chloride 98 - 111 mmol/L 109  103  105   CO2 22 - 32 mmol/L '22  22  23   '$ Calcium 8.9 - 10.3 mg/dL 8.7  8.5  8.7   Total Protein 6.5 - 8.1 g/dL   6.2   Total Bilirubin 0.3 - 1.2 mg/dL   0.8   Alkaline Phos 38 - 126 U/L   76   AST 15 - 41 U/L   15   ALT 0 - 44 U/L   13      Assessment/Plan:   Principal Problem:   Atypical chest pain   Patient Summary: Michael [name] is a 71 y.o. with a pertinent PMH of CAD status post CABG and PCI x2, heart failure with mildly reduced ejection fraction, COPD, CKD 3A, TIA, HIV on Hart therapy, cocaine use  disorder, who presented with chest pain and admitted for atypical chest pain secondary to cocaine use, as well as an AKI.    #Atypical chest pain secondary to cocaine use #History of CAD status post CABG 2019 and PCI 2015/2021 No concern for STEMI or NSTEMI at this point.  Patient states chest pain is still there, however is consistently improving.  Physical therapy mobility specialist are continue to work with him, and he has had no complaints walking up and down the hallways. Plan: - Continue Plavix, Lipitor - Continue nitro as needed - Continue cardiac telemetry  #AKI on CKD 3A Patient's baseline creatinine is usually 2.5-2.9, recent labs showed creatinine level of 3.70, down from 4.54 yesterday, which is an encouraging sign.  Bladder scan revealed a volume of 114 residual.  We will continue to encourage p.o. intake of fluids, at this time no need for IV fluids.  Plan: - Continue to encourage patient to take p.o. fluids - Daily BMPs to evaluate kidney function - Patient's AKI has been improving, and may be set for discharge soon.  #Safe disposition planning #Cocaine use disorder Continue conversation about disposition with patient today.  He states he was able to get in contact with the sister finally, however staying with her as  an option at this point.  Patient is in the process of calling local shelters, and [facility] may be able to assist the patient.  Was able to send medications to [facility] to hold in case patient finds placement sometime this weekend.  Plan: - Current expected discharge plan per social work is homeless shelter  #Cervical radiculopathy Patient has a history of multiple neck surgeries, and has had residual pain.  He was previously taking gabapentin for this.  Plan: - Gabapentin 300 mg (renally dosed)  #Heart failure mildly reduced ejection fraction stage C, NYHA I/II #Ischemic cardiomyopathy Patient's EF is 40 to 45%, with global mild mild hypokinesis of the left ventricle.  Physical exam continues to show euvolemic status with no lower extremity edema.  Plan: --Continue holding Lasix - Strict I's and O's, daily weights  Hypertension Patient's blood pressure has been relatively stable, however last night patient's blood pressure measured 164/92.  He was previously on amlodipine for treatment. Plan: - Start amlodipine 10 mg    Code: Full    Dispo: Anticipated discharge in less than two midnights.  Drucie Opitz, MD PGY-1 Internal Medicine Resident Pager Number [phone] Please contact the on call pager after 5 pm and on weekends at [phone].

## 2022-07-05 NOTE — Progress Notes (Signed)
Physical Therapy Treatment Patient Details Name: Michael [name] MRN: [id number] DOB: [birth year] Today's Date: 07/05/2022   History of Present Illness 71 y.o. male [facility] to [facility] hospital on 07/02/2022 with atypical chest pain after recently using cocaine. PMH includes CAD s/p CABG, CHF, COPD, CKD, TIA, HIV, recurrent falls.    PT Comments    Pt progressing with mobility and likely is near his baseline. Will not be able to get follow up PT due to no address. Will continue to follow acutely.   Recommendations for follow up therapy are one component of a multi-disciplinary discharge planning process, led by the attending physician.  Recommendations may be updated based on patient status, additional functional criteria and insurance authorization.  Follow Up Recommendations   (unable to get [facility] due to no address)     Assistance Recommended at Discharge Intermittent Supervision/Assistance  Patient can return home with the following A little help with bathing/dressing/bathroom;Assistance with cooking/housework;Assist for transportation;Help with stairs or ramp for entrance;Direct supervision/assist for medications management   Equipment Recommendations  None recommended by PT    Recommendations for Other Services       Precautions / Restrictions Precautions Precautions: Fall Precaution Comments: history of falls Restrictions Weight Bearing Restrictions: No     Mobility  Bed Mobility Overal bed mobility: Modified Independent                  Transfers Overall transfer level: Needs assistance Equipment used: Rolling walker (2 wheels) Transfers: Sit to/from Stand Sit to Stand: Supervision           General transfer comment: increased time from lower surface    Ambulation/Gait Ambulation/Gait assistance: Modified independent (Device/Increase time) Gait Distance (Feet): 120 Feet Assistive device: Rolling walker (2 wheels) Gait Pattern/deviations: Step-through  pattern Gait velocity: decr Gait velocity interpretation: 1.31 - 2.62 ft/sec, indicative of limited community ambulator   General Gait Details: steady gait with walker   Stairs             Wheelchair Mobility    Modified Rankin (Stroke Patients Only)       Balance Overall balance assessment: Needs assistance Sitting-balance support: No upper extremity supported, Feet supported Sitting balance-Leahy Scale: Good     Standing balance support: Bilateral upper extremity supported, Reliant on assistive device for balance Standing balance-Leahy Scale: Poor Standing balance comment: walker for support                            Cognition Arousal/Alertness: Awake/alert Behavior During Therapy: WFL for tasks assessed/performed Overall Cognitive Status: Within Functional Limits for tasks assessed                                          Exercises      General Comments General comments (skin integrity, edema, etc.): VSS on RA      Pertinent Vitals/Pain Pain Assessment Pain Assessment: No/denies pain    Home Living                          Prior Function            PT Goals (current goals can now be found in the care plan section) Acute Rehab PT Goals Patient Stated Goal: find housing Progress towards PT goals: Progressing toward goals    Frequency  Min 3X/week      PT Plan Current plan remains appropriate    Co-evaluation              AM-PAC PT "6 Clicks" Mobility   Outcome Measure  Help needed turning from your back to your side while in a flat bed without using bedrails?: None Help needed moving from lying on your back to sitting on the side of a flat bed without using bedrails?: None Help needed moving to and from a bed to a chair (including a wheelchair)?: A Little Help needed standing up from a chair using your arms (e.g., wheelchair or bedside chair)?: A Little Help needed to walk in hospital  room?: None Help needed climbing 3-5 steps with a railing? : A Little 6 Click Score: 21    End of Session   Activity Tolerance: Patient tolerated treatment well Patient left: in chair;with call bell/phone within reach;with chair alarm set Nurse Communication: Mobility status PT Visit Diagnosis: Other abnormalities of gait and mobility (R26.89);Muscle weakness (generalized) (M62.81);History of falling (Z91.81)     Time: 5916-3846 PT Time Calculation (min) (ACUTE ONLY): 15 min  Charges:  $Gait Training: 8-22 mins                     [place] [facility] [place] 07/05/2022, 1:21 PM

## 2022-07-05 NOTE — TOC Progression Note (Signed)
Transition of Care [facility]) - Progression Note    Patient Details  Name: Michael [name] MRN: [id number] Date of Birth: 07/29/1951  Transition of Care [facility]) CM/SW Contact  Graves-Bigelow, Ocie Cornfield, RN Phone Number: 07/05/2022, 10:12 AM  Clinical Narrative: Case Manager spoke with providers regarding medical stability for home and when patient will be ready to discharge. Patient is in the process of calling local shelters and the MD has called [facility] to see if they can assist the patient. Case Manager asked providers to send all new medications to the [place] to ensure that the patient gets all medications needed. MD is working on this request. Case Manager did relay to providers that the patient will not have [place]- due to no local address. CSW is assisting with resources. Hospital follow up appointment has been scheduled for this patient and information placed on the AVS. No further needs identified at this time.   Expected Discharge Plan: Homeless Shelter Barriers to Discharge: Continued Medical Work up  Expected Discharge Plan and Services Expected Discharge Plan: Homeless Shelter In-house Referral: PCP / Psychologist, educational Discharge Planning Services: [facility], CM Consult Post Acute Care Choice: NA Living arrangements for the past 2 months: Homeless                 DME Arranged: N/A DME Agency: NA         HH Agency:  (Unable to arrange [place] no address.)   Social Determinants of Health (SDOH) Interventions Housing Interventions: Other (Comment) (area shelter resources given)  Readmission Risk Interventions    07/05/2022   10:08 AM 12/09/2019    1:46 PM  Readmission Risk Prevention Plan  Transportation Screening Complete Complete  Medication Review ([place]) Complete Complete  PCP or Specialist appointment within 3-5 days of discharge Complete Complete  HRI or Home Care Consult Complete Complete  SW Recovery Care/Counseling Consult Complete  Complete  Palliative Care Screening Not Applicable Not [place] Not Applicable Not Applicable

## 2022-07-05 NOTE — TOC Progression Note (Addendum)
Transition of Care [facility]) - Progression Note    Patient Details  Name: Michael [name] MRN: [id number] Date of Birth: 05-07-1951  Transition of Care [facility]) CM/SW [place], [place] Phone Number: 07/05/2022, 2:59 PM  Clinical Narrative:     CSW received consult for food resources for patient. CSW spoke with patient and completed screening assessment. CSW offered patient food resources.Patient accepted.CSW offered patient psychiatry resources. Patient accepted. Patient reports will need transportation at dc. All questions answered. No further questions reported at this time.  Expected Discharge Plan: Homeless Shelter Barriers to Discharge: Continued Medical Work up  Expected Discharge Plan and Services Expected Discharge Plan: Homeless Shelter In-house Referral: PCP / Psychologist, educational Discharge Planning Services: [facility], CM Consult Post Acute Care Choice: NA Living arrangements for the past 2 months: Homeless                 DME Arranged: N/A DME Agency: NA         HH Agency:  (Unable to arrange [place] no address.)         Social Determinants of Health (SDOH) Interventions Food Insecurity Interventions: Other (Comment) (Food resources delivered) Housing Interventions: Other (Comment) (area shelter resources given)  Readmission Risk Interventions    07/05/2022   10:08 AM 12/09/2019    1:46 PM  Readmission Risk Prevention Plan  Transportation Screening Complete Complete  Medication Review ([place]) Complete Complete  PCP or Specialist appointment within 3-5 days of discharge Complete Complete  HRI or Home Care Consult Complete Complete  SW Recovery Care/Counseling Consult Complete Complete  Palliative Care Screening Not Applicable Not [place] Not Applicable Not Applicable

## 2022-07-05 NOTE — TOC Transition Note (Signed)
Three (3) discharge meds stored in the inpatient pharmacy for patient until discharge.

## 2022-07-05 NOTE — Plan of Care (Signed)
  Problem: Education: Goal: Knowledge of General Education information will improve Description: Including pain rating scale, medication(s)/side effects and non-pharmacologic comfort measures Outcome: Progressing   Problem: Health Behavior/Discharge Planning: Goal: Ability to manage health-related needs will improve Outcome: Progressing   Problem: Clinical Measurements: Goal: Cardiovascular complication will be avoided Outcome: Progressing   Problem: Activity: Goal: Risk for activity intolerance will decrease Outcome: Progressing   Problem: Pain Managment: Goal: General experience of comfort will improve Outcome: Progressing   Problem: Safety: Goal: Ability to remain free from injury will improve Outcome: Progressing   Problem: Tissue Perfusion: Goal: Adequacy of tissue perfusion will improve Outcome: Progressing

## 2022-07-05 NOTE — TOC Progression Note (Addendum)
Transition of Care [facility]) - Progression Note    Patient Details  Name: Michael [name] MRN: [id number] Date of Birth: [birth year]  Transition of Care [facility]) CM/SW Contact  Graves-Bigelow, Ocie Cornfield, RN Phone Number: 07/05/2022, 4:36 PM  Clinical Narrative: Case Manager spoke with patient regarding plan of care. If the plan is for the patient to d/c on Saturday: Patient states he will need to go by the bank for some money at d/c.  Patient states he will have to pay for a motel. [facility] Carol[facility] on [employer] opens Saturday morning at 9:00 and closes at noon. Patient is asking if he can d/c before 10:00 am; he will need a cab voucher for transportation. Case Manager did submit the above information to the provider and team and states they will work on the request. Medications are being stored in the [employer]. Weekend TOC team to follow.   Expected Discharge Plan: Homeless Shelter Barriers to Discharge: Continued Medical Work up  Expected Discharge Plan and Services Expected Discharge Plan: Homeless Shelter In-house Referral: PCP / Psychologist, educational Discharge Planning Services: [name], CM Consult Post Acute Care Choice: NA Living arrangements for the past 2 months: Homeless                 DME Arranged: N/A DME Agency: NA         HH Agency:  (Unable to arrange [place] no address.)       Social Determinants of Health (SDOH) Interventions Food Insecurity Interventions: Other (Comment) (Food resources delivered) Housing Interventions: Other (Comment) (area shelter resources given) Transportation Interventions: Other (Comment) (Will assist with tranportation at dc)  Readmission Risk Interventions    07/05/2022   10:08 AM 12/09/2019    1:46 PM  Readmission Risk Prevention Plan  Transportation Screening Complete Complete  Medication Review [employer]) Complete Complete  PCP or Specialist appointment within 3-5 days of discharge Complete Complete  HRI or Home Care  Consult Complete Complete  SW Recovery Care/Counseling Consult Complete Complete  Palliative Care Screening Not Applicable Not [place] Not Applicable Not Applicable

## 2022-07-06 DIAGNOSIS — N1832 Chronic kidney disease, stage 3b: Secondary | ICD-10-CM

## 2022-07-06 LAB — BASIC METABOLIC PANEL
Anion gap: 4 — ABNORMAL LOW (ref 5–15)
BUN: 47 mg/dL — ABNORMAL HIGH (ref 8–23)
CO2: 28 mmol/L (ref 22–32)
Calcium: 8.7 mg/dL — ABNORMAL LOW (ref 8.9–10.3)
Chloride: 107 mmol/L (ref 98–111)
Creatinine, Ser: 3.51 mg/dL — ABNORMAL HIGH (ref 0.61–1.24)
GFR, Estimated: 18 mL/min — ABNORMAL LOW (ref >60)
Glucose, Bld: 203 mg/dL — ABNORMAL HIGH (ref 70–99)
Potassium: 4.1 mmol/L (ref 3.5–5.1)
Sodium: 139 mmol/L (ref 135–145)

## 2022-07-06 LAB — CBC
HCT: 26.6 % — ABNORMAL LOW (ref 39.0–52.0)
Hemoglobin: 8.8 g/dL — ABNORMAL LOW (ref 13.0–17.0)
MCH: 27.6 pg (ref 26.0–34.0)
MCHC: 33.1 g/dL (ref 30.0–36.0)
MCV: 83.4 fL (ref 80.0–100.0)
Platelets: 104 10*3/uL — ABNORMAL LOW (ref 150–400)
RBC: 3.19 MIL/uL — ABNORMAL LOW (ref 4.22–5.81)
RDW: 13.1 % (ref 11.5–15.5)
WBC: 3.2 10*3/uL — ABNORMAL LOW (ref 4.0–10.5)
nRBC: 0 % (ref 0.0–0.2)

## 2022-07-06 LAB — GLUCOSE, CAPILLARY
Glucose-Capillary: 161 mg/dL — ABNORMAL HIGH (ref 70–99)
Glucose-Capillary: 182 mg/dL — ABNORMAL HIGH (ref 70–99)
Glucose-Capillary: 188 mg/dL — ABNORMAL HIGH (ref 70–99)
Glucose-Capillary: 161 mg/dL — ABNORMAL HIGH (ref 70–99)

## 2022-07-06 MED ORDER — SODIUM CHLORIDE 0.9 % IV SOLN
250.0000 mg | Freq: Every day | INTRAVENOUS | Status: AC
  Administered 2022-07-06: 250 mg via INTRAVENOUS
  Filled 2022-07-06: qty 20

## 2022-07-06 NOTE — Plan of Care (Signed)
  Problem: Health Behavior/Discharge Planning: Goal: Ability to manage health-related needs will improve Outcome: Progressing   Problem: Clinical Measurements: Goal: Cardiovascular complication will be avoided Outcome: Progressing   Problem: Pain Managment: Goal: General experience of comfort will improve Outcome: Progressing   Problem: Safety: Goal: Ability to remain free from injury will improve Outcome: Progressing   Problem: Health Behavior/Discharge Planning: Goal: Ability to identify and utilize available resources and services will improve Outcome: Progressing Goal: Ability to manage health-related needs will improve Outcome: Progressing

## 2022-07-06 NOTE — Progress Notes (Signed)
Subjective:  Patient states he has some SOB with ambulating and some MSK related chest pain but is otherwise doing well. He has unfortunately not been able to contact his siblings and does not want to disturb his children.   Objective:  Vital signs in last 24 hours: Vitals:   07/06/22 0400 07/06/22 0900 07/06/22 0920 07/06/22 1200  BP: (!) 154/75 136/75  (!) 166/87  Pulse: (!) 56 63 61 65  Resp: '19 20 16 18  '$ Temp: 98.1 F (36.7 C) 98.8 F (37.1 C)  98.5 F (36.9 C)  TempSrc: Oral Oral  Oral  SpO2: 96% 100% 99% 100%  Weight:      Height:        Constitutional: Well-developed, well-nourished, and in no distress.  HENT:  Neck: Normal range of motion.  Cardiovascular: Normal rate, regular rhythm, intact distal pulses. No gallop and no friction rub.  No murmur heard. No lower extremity edema  Pulmonary: Non labored breathing on room air, no wheezing or rales  Abdominal: Soft. Normal bowel sounds. Non distended and non tender Musculoskeletal: Normal range of motion.        General: Mild TTP over left chest wall  Neurological: Alert and oriented to person, place, and time. Non focal  Skin: Skin is warm and dry.    Assessment/Plan:  Principal Problem:   Atypical chest pain  Michael [name] is a 71 y.o. with a pertinent PMH of CAD status post CABG and PCI x2, heart failure with mildly reduced ejection fraction, COPD, CKD 3A, TIA, HIV on Hart therapy, cocaine use disorder, who presented with chest pain and admitted for atypical chest pain secondary to cocaine use, as well as an AKI.      #Atypical chest pain secondary to cocaine use #History of CAD status post CABG 2019 and PCI 2015/2021 No concern for ACS.  Patient states chest pain is still there, but notes this is with movement.  Stated he has associated neck pain.  Physical therapy mobility specialist are continue to work with him, and he has had no complaints walking up and down the hallways. Plan: - Continue Plavix,  Lipitor - Continue nitro as needed - Continue cardiac telemetry   #AKI on CKD 3A Patient's baseline creatinine is usually 2.5-2.9, continues to improve.   Plan: - Continue to encourage patient to take p.o. fluids - Daily BMPs to evaluate kidney function - Patient's AKI has been improving, and may be set for discharge soon.   #Safe disposition planning #Cocaine use disorder Continued conversation about disposition with patient today.  Patient states that he was able to talk to his sister yesterday but staying with her is not an option.  He was given resources for the [facility] and notes that he currently has his mail sent there.  This is a difficult situation. Patient initially stated that he was amenable to discharging to shelter, but noted this PM to the nursing staff that he is unable to go to the bank and would need to stay until Monday.  Plan: - Plan for discharge later this weekend v monday   #Cervical radiculopathy Patient has a history of multiple neck surgeries, and has had residual pain.  He was previously taking gabapentin for this.   Plan: - Gabapentin 300 mg (renally dosed)   #Heart failure mildly reduced ejection fraction stage C, NYHA I/II #Ischemic cardiomyopathy Patient's EF is 40 to 45%, with global mild mild hypokinesis of the left ventricle.  Physical exam continues to show  euvolemic status with no lower extremity edema.   Plan: --Continue holding Lasix - Strict I's and O's, daily weights   Hypertension Patient's blood pressures have been elevated and he was started on amlodipine. He was previously on amlodipine for this. His Bps remain elevated. His kidney function precludes other antihypertensives and his cocaine use prevents beta blockers. Do not think that patient would reliably take hydralazine.  Plan: - Continue amlodipine 10 mg --Continue to monitor  Prior to Admission Living Arrangement: unhomed  Dispo: Shelter   Rick Duff, MD 07/06/2022, 2:35  PM  After 5pm on weekdays and 1pm on weekends: On Call pager [phone]

## 2022-07-07 LAB — CBC
HCT: 25.9 % — ABNORMAL LOW (ref 39.0–52.0)
Hemoglobin: 8.7 g/dL — ABNORMAL LOW (ref 13.0–17.0)
MCH: 27.9 pg (ref 26.0–34.0)
MCHC: 33.6 g/dL (ref 30.0–36.0)
MCV: 83 fL (ref 80.0–100.0)
Platelets: 104 10*3/uL — ABNORMAL LOW (ref 150–400)
RBC: 3.12 MIL/uL — ABNORMAL LOW (ref 4.22–5.81)
RDW: 13.2 % (ref 11.5–15.5)
WBC: 4 10*3/uL (ref 4.0–10.5)
nRBC: 0 % (ref 0.0–0.2)

## 2022-07-07 LAB — BASIC METABOLIC PANEL
Anion gap: 6 (ref 5–15)
BUN: 43 mg/dL — ABNORMAL HIGH (ref 8–23)
CO2: 23 mmol/L (ref 22–32)
Calcium: 8.6 mg/dL — ABNORMAL LOW (ref 8.9–10.3)
Chloride: 110 mmol/L (ref 98–111)
Creatinine, Ser: 3.38 mg/dL — ABNORMAL HIGH (ref 0.61–1.24)
GFR, Estimated: 19 mL/min — ABNORMAL LOW (ref >60)
Glucose, Bld: 169 mg/dL — ABNORMAL HIGH (ref 70–99)
Potassium: 4.1 mmol/L (ref 3.5–5.1)
Sodium: 139 mmol/L (ref 135–145)

## 2022-07-07 LAB — TECHNOLOGIST SMEAR REVIEW

## 2022-07-07 LAB — GLUCOSE, CAPILLARY
Glucose-Capillary: 166 mg/dL — ABNORMAL HIGH (ref 70–99)
Glucose-Capillary: 156 mg/dL — ABNORMAL HIGH (ref 70–99)
Glucose-Capillary: 167 mg/dL — ABNORMAL HIGH (ref 70–99)
Glucose-Capillary: 206 mg/dL — ABNORMAL HIGH (ref 70–99)

## 2022-07-07 MED ORDER — SODIUM CHLORIDE 0.9 % IV SOLN
250.0000 mg | Freq: Every day | INTRAVENOUS | Status: AC
  Administered 2022-07-07: 250 mg via INTRAVENOUS
  Filled 2022-07-07: qty 20

## 2022-07-07 NOTE — Progress Notes (Signed)
Subjective:   Summary: Michael [name] is a 71 y.o. year old male currently admitted on the IMTS HD#4 for atypical chest pain and AKI.  Overnight Events: NOE   Pt was seen this morning bedside. He was resting comfortably in his chair. He states his cervical pain has improved, and chest pain has minimized. He is still unable to contact any family members or shelters.   Objective:  Vital signs in last 24 hours: Vitals:   07/06/22 1400 07/06/22 1949 07/07/22 0859 07/07/22 0951  BP: (!) 146/74 131/61  (!) 144/66  Pulse: 66 62  64  Resp: '18 19  19  '$ Temp: 98.7 F (37.1 C) 98.1 F (36.7 C)  98 F (36.7 C)  TempSrc: Oral Oral  Oral  SpO2: 100% 100% 98% 98%  Weight:      Height:       Supplemental O2: Room Air SpO2: 98 %   Physical Exam:  Constitutional: Elderly gentleman,  in no acute distress Cardiovascular: RRR, no murmurs, rubs or gallops Pulmonary/Chest: normal work of breathing on room air, lungs clear to auscultation bilaterally Abdominal: soft, non-tender, non-distended Skin: warm and dry Extremities: upper/lower extremity pulses 2+, no lower extremity edema present  [facility] Weights   07/02/22 0707 07/02/22 1804  Weight: 72.1 kg 72 kg     Intake/Output Summary (Last 24 hours) at 07/07/2022 1037 Last data filed at 07/07/2022 0951 Gross per 24 hour  Intake 870 ml  Output 750 ml  Net 120 ml   Net IO Since Admission: -3,245 mL [07/07/22 1037]  Pertinent Labs:    Latest Ref Rng & Units 07/07/2022    1:33 AM 07/06/2022    1:57 AM 07/05/2022    1:44 AM  CBC  WBC 4.0 - 10.5 K/uL 4.0  3.2  3.7   Hemoglobin 13.0 - 17.0 g/dL 8.7  8.8  8.6   Hematocrit 39.0 - 52.0 % 25.9  26.6  26.0   Platelets 150 - 400 K/uL 104  104  114        Latest Ref Rng & Units 07/07/2022    1:33 AM 07/06/2022    1:57 AM 07/05/2022    1:44 AM  CMP  Glucose 70 - 99 mg/dL 169  203  176   BUN 8 - 23 mg/dL 43  47  60   Creatinine 0.61 - 1.24 mg/dL 3.38  3.51  3.79    Sodium 135 - 145 mmol/L 139  139  140   Potassium 3.5 - 5.1 mmol/L 4.1  4.1  4.1   Chloride 98 - 111 mmol/L 110  107  109   CO2 22 - 32 mmol/L '23  28  22   '$ Calcium 8.9 - 10.3 mg/dL 8.6  8.7  8.7      Assessment/Plan:   Principal Problem:   Atypical chest pain   Patient Summary: Michael [name] is a 71 y.o. with a pertinent PMH of CAD s/p CABG and PCI x2, HFwmrEF, COPD, CKD 3A, TIA, HIV on HAART therapy, cocaine use disorder, who presented with chest pain  and admitted for atypical chest pain secondary to cocaine use, as well as an AKI.    #Atypical Chest Pain Secondary to Cocaine Use #Hx of CAD S/P CABG 2019 and PCI 2015/2021 Pt states chest pain has gotten much better. He still has some uncomfortable sensations  in the chest, however he states at  baseline this is the case since his CABG in 2019. He denies any shortness of breath, and has been able to ambulate well up and down the hallway with no distress.   Plan:  - Continue plavix, Lipitor - Continue Nitro as needed - Continue cardiac telemetry  #AKI on CKD 3A Pt's baseline creatinine is usually 2.5-2.9, most recent labs showed a creatinine of 3.38, down from 3.51 yesterday. Continue to encourage po intake of fluids, no need for IV fluids   Plan:  - Continuing encouraging patient to take PO fluids  - Daily BMPs to evaluate kidney function  - Patient' AKI has been improving, set for discharge tomorrow.   #Safe Disposition Planning  #Cocaine Use Disorder  He has been able to get in contact with his family, however they are hesitant to house patient due to hx of cocaine use. He states he has not been able to get in contact with shelters at this moment. Patient's plan after discharge is to go to [facility] to recollect his belongings, and ask for help there at the moment.  #Cervical radiculopathy Patient has a history of multiple neck surgeries, and continues to have residual pain.  He states that the gabapentin was started has  significantly decreased.  Plan: - Continue gabapentin 300 mg once a day (renally dosed)  #Hypertension Patient's most recent blood pressure was 144/66, however blood pressure has been relatively steady.  We will continue to evaluate and monitor his BP.  Plan: - Continue amlodipine 10 mg daily - Continue to monitor BP  #Heart failure with mildly reduced ejection fraction stage C, NYHA I/II #Ischemic cardiomyopathy Patient's EF is 40 to 45% with global mild hypokinesis of the left ventricle.  Physical exam continues to show euvolemic status with no lower extremity edema Plan: - Strict I's and O's, daily weights - Diuretic continues to not be necessary at this point    Code: Full    Dispo: Anticipated discharge to in less than 2 midnights.  Drucie Opitz, MD PGY-1 Internal Medicine Resident Pager Number [phone] Please contact the on call pager after 5 pm and on weekends at [phone].

## 2022-07-07 NOTE — TOC Progression Note (Signed)
Transition of Care [facility]) - Progression Note    Patient Details  Name: Michael [name] MRN: [id number] Date of Birth: 12-30-1950  Transition of Care [facility]) CM/SW [place], [place] Phone Number: 07/07/2022, 12:00 PM  Clinical Narrative:     CSW followed up with patient on his dc plan. Patient reports plan is to dc tomorrow to [facility] if medically ready. Patient reports he will need transportation to [facility]. CSW will continue to follow and assist with patients dc planning needs.  Expected Discharge Plan: Homeless Shelter Barriers to Discharge: Continued Medical Work up  Expected Discharge Plan and Services Expected Discharge Plan: Homeless Shelter In-house Referral: PCP / Psychologist, educational Discharge Planning Services: [facility], CM Consult Post Acute Care Choice: NA Living arrangements for the past 2 months: Homeless                 DME Arranged: N/A DME Agency: NA         HH Agency:  (Unable to arrange [place] no address.)         Social Determinants of Health (SDOH) Interventions Food Insecurity Interventions: Other (Comment) (Food resources delivered) Housing Interventions: Other (Comment) (area shelter resources given) Transportation Interventions: Other (Comment) (Will assist with tranportation at dc)  Readmission Risk Interventions    07/05/2022   10:08 AM 12/09/2019    1:46 PM  Readmission Risk Prevention Plan  Transportation Screening Complete Complete  Medication Review Press photographer) Complete Complete  PCP or Specialist appointment within 3-5 days of discharge Complete Complete  HRI or Home Care Consult Complete Complete  SW Recovery Care/Counseling Consult Complete Complete  Palliative Care Screening Not Applicable Not [place] Not Applicable Not Applicable

## 2022-07-07 DEATH — deceased

## 2022-07-08 LAB — BASIC METABOLIC PANEL
Anion gap: 8 (ref 5–15)
BUN: 42 mg/dL — ABNORMAL HIGH (ref 8–23)
CO2: 24 mmol/L (ref 22–32)
Calcium: 9 mg/dL (ref 8.9–10.3)
Chloride: 109 mmol/L (ref 98–111)
Creatinine, Ser: 3.33 mg/dL — ABNORMAL HIGH (ref 0.61–1.24)
GFR, Estimated: 19 mL/min — ABNORMAL LOW (ref >60)
Glucose, Bld: 127 mg/dL — ABNORMAL HIGH (ref 70–99)
Potassium: 4.2 mmol/L (ref 3.5–5.1)
Sodium: 141 mmol/L (ref 135–145)

## 2022-07-08 LAB — CBC
HCT: 27 % — ABNORMAL LOW (ref 39.0–52.0)
Hemoglobin: 9.1 g/dL — ABNORMAL LOW (ref 13.0–17.0)
MCH: 28 pg (ref 26.0–34.0)
MCHC: 33.7 g/dL (ref 30.0–36.0)
MCV: 83.1 fL (ref 80.0–100.0)
Platelets: 113 10*3/uL — ABNORMAL LOW (ref 150–400)
RBC: 3.25 MIL/uL — ABNORMAL LOW (ref 4.22–5.81)
RDW: 13.3 % (ref 11.5–15.5)
WBC: 4.4 10*3/uL (ref 4.0–10.5)
nRBC: 0 % (ref 0.0–0.2)

## 2022-07-08 LAB — GLUCOSE, CAPILLARY
Glucose-Capillary: 127 mg/dL — ABNORMAL HIGH (ref 70–99)
Glucose-Capillary: 105 mg/dL — ABNORMAL HIGH (ref 70–99)

## 2022-07-08 NOTE — Plan of Care (Signed)

## 2022-07-08 NOTE — Discharge Instructions (Addendum)
It was a pleasure being a part of your care during your stay here. You came in with some chest pain after using cocaine, and was found to have some kidney issues. We gave you some fluids and some meds for your chest pain and your kidneys. Please take your medications on time, and please do your best to stop using cocaine. All the best. Your follow up appointment  with your primary care physician is on October 19th.

## 2022-07-08 NOTE — Discharge Summary (Signed)
Name: Michael [name] MRN: [id number] DOB: 23-Jul-1951 71 y.o. PCP: Kerin Perna, NP  Date of Admission: 07/02/2022  7:02 AM Date of Discharge: 07/08/2022 12:41 PM Attending Physician: Dr. Velna Ochs  Discharge Diagnosis: 1. Principal Problem:   Atypical chest pain   Discharge Medications: Allergies as of 07/08/2022   No Known Allergies      Medication List     STOP taking these medications    Biktarvy 50-200-25 MG Tabs tablet Generic drug: bictegravir-emtricitabine-tenofovir AF   furosemide 80 MG tablet Commonly known as: LASIX   hydrALAZINE 25 MG tablet Commonly known as: APRESOLINE   isosorbide mononitrate 60 MG 24 hr tablet Commonly known as: IMDUR   lidocaine 5 % Commonly known as: LIDODERM       TAKE these medications    albuterol 108 (90 Base) MCG/ACT inhaler Commonly known as: VENTOLIN HFA Inhale 1-2 puffs into the lungs every 6 (six) hours as needed for wheezing or shortness of breath.   amLODipine 10 MG tablet Commonly known as: NORVASC Take 1 tablet (10 mg total) by mouth daily. What changed: You were already taking a medication with the same name, and this prescription was added. Make sure you understand how and when to take each.   amLODipine 10 MG tablet Commonly known as: NORVASC Take 1 tablet (10 mg total) by mouth daily. What changed: Another medication with the same name was added. Make sure you understand how and when to take each.   aspirin EC 81 MG tablet Take 81 mg by mouth daily. Swallow whole.   atorvastatin 40 MG tablet Commonly known as: LIPITOR Take 40 mg by mouth daily.   buPROPion 150 MG 24 hr tablet Commonly known as: WELLBUTRIN XL Take 1 tablet (150 mg total) by mouth every morning.   clopidogrel 75 MG tablet Commonly known as: PLAVIX Take 1 tablet (75 mg total) by mouth daily.   diltiazem 120 MG 24 hr capsule Commonly known as: CARDIZEM CD Take 120 mg by mouth 2 (two) times daily. What changed:  Another medication with the same name was added. Make sure you understand how and when to take each.   diltiazem 240 MG 24 hr capsule Commonly known as: CARDIZEM CD Take 1 capsule (240 mg total) by mouth daily. What changed: You were already taking a medication with the same name, and this prescription was added. Make sure you understand how and when to take each.   dolutegravir-rilpivirine 50-25 MG tablet Commonly known as: JULUCA Take 1 tablet by mouth daily before lunch. What changed: when to take this   Eliquis 5 MG Tabs tablet Generic drug: apixaban Take 1 tablet (5 mg total) by mouth 2 (two) times daily.   FLUoxetine 20 MG capsule Commonly known as: PROZAC Take 20 mg by mouth daily.   gabapentin 300 MG capsule Commonly known as: NEURONTIN Take 1 capsule (300 mg total) by mouth at bedtime. What changed:  medication strength how much to take when to take this   HumaLOG KwikPen 100 UNIT/ML KwikPen Generic drug: insulin lispro Inject 4 Units into the skin 3 (three) times daily with meals.   Incruse Ellipta 62.5 MCG/ACT Aepb Generic drug: umeclidinium bromide Inhale 1 puff into the lungs daily.   Lantus SoloStar 100 UNIT/ML Solostar Pen Generic drug: insulin glargine Inject 6 Units into the skin daily. What changed: how much to take   nitroGLYCERIN 0.4 MG SL tablet Commonly known as: NITROSTAT Place 1 tablet (0.4 mg total) under the tongue  every 5 (five) minutes as needed for chest pain.   pantoprazole 40 MG tablet Commonly known as: PROTONIX Take 1 tablet (40 mg total) by mouth daily as needed (for heartburn).   Semaglutide(0.25 or 0.'5MG'$ /DOS) 2 MG/3ML Sopn Inject 0.5 mg into the skin once a week.   sevelamer carbonate 800 MG tablet Commonly known as: RENVELA Take 800 mg by mouth 3 (three) times daily.   Symbicort 160-4.5 MCG/ACT inhaler Generic drug: budesonide-formoterol Inhale 2 puffs into the lungs in the morning and at bedtime.   traZODone 100 MG  tablet Commonly known as: DESYREL Take 100 mg by mouth at bedtime. What changed: Another medication with the same name was removed. Continue taking this medication, and follow the directions you see here.   Unifine Pentips 31G X 5 MM Misc Generic drug: Insulin Pen Needle Use with insulin pens What changed:  how much to take how to take this when to take this        Disposition and follow-up:   Michael [name] was discharged from [facility] in Good condition.  At the hospital follow up visit please address:  1.  Medication compliance, homeless situation  2.  Labs / imaging needed at time of follow-up: BMP  3.  Pending labs/ test needing follow-up: NA  Follow-up Appointments: October 19th with PCP  Hospital Course by problem list:   #Atypical Chest Pain Secondary to Cocaine Use  #Hx of CAD s/p CABG 2019 and PCI 2015/2021 #Cocaine use disorder Patient initially presented to the emergency department with chest pain that radiates into his left arm and neck, after using cocaine.  He has had this chronic chest pain since his CABG procedure in 2019.  He has had admissions like this previously, and has a history of cocaine use disorder.  He was started on his home medications of Plavix, Lipitor, diltiazem and kept on cardiac telemetry.  Chest pain gradually decreased as his course in the hospital continued.  #AKI on CKD 3A Patient's baseline creatinine is usually 2.5-2.9, however on admission creatinine was 4.22.  Fluids were administered daily, and creatinine levels were decreasing his last one being 3.33 this AM.  AKI was likely spurred on by vasospasm secondary to cocaine use.  #Safe disposition planning Patient was previously admitted to [facility] for over a month for the same symptoms of chest pain related to cocaine use.  Full work-up was completed there, and they were unable to find appropriate housing for him at the time which led to delay in his  disposition.  He has family members all over [place], however none of them really speak with him anymore due to his cocaine use.  After leaving [facility], he was at a [facility] in [place], checked himself out, came to [place] ended up using cocaine, and ended up in the emergency department at [name].  Patient is a member of the [facility], and they are currently helping him finding a place to live.  #Heart failure with mildly reduced ejection fraction stage C, NYHA I/II #Ischemic cardiomyopathy Patient's left ventricular ejection fraction is 40 to 45%, with mild global hypokinesis of the left ventricle.  He was previously on Lasix 80 mg twice daily orally.  Throughout his hospital course he was euvolemic with no lower extremity edema or JVD appreciated.  We held his Lasix in the setting of his AKI and also his euvolemic status, and recommend continue holding Lasix.  #HIV Patient follows with [name]  of infectious disease.  During admission at [facility], ID was consulted with concern for continuing Biktarvy with worsening renal function and was changed to Juluca 1 tablet p.o. daily, which is the medication we continue to monitor.   #Type 2 diabetes mellitus Patient was previously on metformin however due to renal renal function was discontinued.  A1c as of July 2023 was 8.3.  We started a sliding scale of insulin during his hospitalization, which proved optimal for his glucose levels.  Discharge Exam:   BP (!) 150/82 (BP Location: Left Arm)   Pulse 64   Temp 98.3 F (36.8 C) (Oral)   Resp 17   Ht '5\' 8"'$  (1.727 m)   Wt 72 kg   SpO2 99%   BMI 24.15 kg/m  Discharge exam:   Constitutional: Elderly gentleman, poor dentition, resting comfortably in bed, in no acute distress Cardiovascular: RRR, no murmurs, rubs or gallops Pulmonary/Chest: normal work of breathing on room air, lungs clear to auscultation bilaterally Abdominal: soft, non-tender, non-distended Skin:  warm and dry Extremities: upper/lower extremity pulses 2+, no lower extremity edema present  Pertinent Labs, Studies, and Procedures:     Latest Ref Rng & Units 07/08/2022    3:39 AM 07/07/2022    1:33 AM 07/06/2022    1:57 AM  CBC  WBC 4.0 - 10.5 K/uL 4.4  4.0  3.2   Hemoglobin 13.0 - 17.0 g/dL 9.1  8.7  8.8   Hematocrit 39.0 - 52.0 % 27.0  25.9  26.6   Platelets 150 - 400 K/uL 113  104  104        Latest Ref Rng & Units 07/08/2022    3:39 AM 07/07/2022    1:33 AM 07/06/2022    1:57 AM  BMP  Glucose 70 - 99 mg/dL 127  169  203   BUN 8 - 23 mg/dL 42  43  47   Creatinine 0.61 - 1.24 mg/dL 3.33  3.38  3.51   Sodium 135 - 145 mmol/L 141  139  139   Potassium 3.5 - 5.1 mmol/L 4.2  4.1  4.1   Chloride 98 - 111 mmol/L 109  110  107   CO2 22 - 32 mmol/L '24  23  28   '$ Calcium 8.9 - 10.3 mg/dL 9.0  8.6  8.7      DG Chest 2 View  Result Date: 07/02/2022 CLINICAL DATA:  Chest pain. EXAM: CHEST - 2 VIEW COMPARISON:  Chest x-ray February 14, 23. FINDINGS: The heart size and mediastinal contours are within normal limits. Median sternotomy. Left atrial appendage clip. Coronary artery stent. Both lungs are clear. No visible pleural effusions or pneumothorax. No acute osseous abnormality. ACDF. IMPRESSION: No active cardiopulmonary disease. Electronically Signed   By: Margaretha Sheffield M.D.   On: 07/02/2022 07:44     Discharge Instructions: Discharge Instructions     Call MD for:  difficulty breathing, headache or visual disturbances   Complete by: As directed    Call MD for:  severe uncontrolled pain   Complete by: As directed    Call MD for:  temperature >100.4   Complete by: As directed    Diet - low sodium heart healthy   Complete by: As directed    Increase activity slowly   Complete by: As directed        Signed: Drucie Opitz, MD 07/08/2022, 2:39 PM   Pager: [phone]

## 2022-07-08 NOTE — Progress Notes (Signed)
Physical Therapy Treatment Patient Details Name: Michael [name] MRN: [id number] DOB: [birth year] Today's Date: 07/08/2022   History of Present Illness 71 y.o. male presents to [facility] hospital on 07/02/2022 with atypical chest pain after recently using cocaine. PMH includes CAD s/p CABG, CHF, COPD, CKD, TIA, HIV, recurrent falls.    PT Comments    Patient progressing slowly towards PT goals. Required Min A for standing from EOB today likely due to weakness and pain. Tolerated gait training 120' with use of RW and needing Min A at times due to LOB x2 posteriorly. Reports hurting on entire left side of body today including chest, head and neck, down to his toes. Discussed how to get up off the floor safely when falls occur. Pt eager to d/c from the hospital today. Will follow.    Recommendations for follow up therapy are one component of a multi-disciplinary discharge planning process, led by the attending physician.  Recommendations may be updated based on patient status, additional functional criteria and insurance authorization.  Follow Up Recommendations   (cant get HH due to no address)     Assistance Recommended at Discharge Intermittent Supervision/Assistance  Patient can return home with the following A little help with bathing/dressing/bathroom;Assistance with cooking/housework;Assist for transportation;Help with stairs or ramp for entrance;Direct supervision/assist for medications management;A little help with walking and/or transfers   Equipment Recommendations  None recommended by PT    Recommendations for Other Services       Precautions / Restrictions Precautions Precautions: Fall;Other (comment) Precaution Comments: history of falls Restrictions Weight Bearing Restrictions: No     Mobility  Bed Mobility Overal bed mobility: Modified Independent                  Transfers Overall transfer level: Needs assistance Equipment used: Rolling walker (2  wheels) Transfers: Sit to/from Stand Sit to Stand: Min assist           General transfer comment: Light Min A to power to standing wtih a few attempts from EOB x1. transferred to chair post ambulation.    Ambulation/Gait Ambulation/Gait assistance: Min assist, Min guard Gait Distance (Feet): 120 Feet Assistive device: Rolling walker (2 wheels) Gait Pattern/deviations: Step-through pattern, Narrow base of support Gait velocity: decr     General Gait Details: SLow, mildly unsteady gait with 2 instances of LOB posteriorly needing Min A for support. Limited by weakness/pain.   Stairs             Wheelchair Mobility    Modified Rankin (Stroke Patients Only)       Balance Overall balance assessment: Needs assistance Sitting-balance support: No upper extremity supported, Feet supported Sitting balance-Leahy Scale: Good     Standing balance support: During functional activity, Reliant on assistive device for balance Standing balance-Leahy Scale: Poor Standing balance comment: walker for support                            Cognition Arousal/Alertness: Awake/alert Behavior During Therapy: WFL for tasks assessed/performed Overall Cognitive Status: Within Functional Limits for tasks assessed                                          Exercises      General Comments General comments (skin integrity, edema, etc.): VSS on RA      Pertinent Vitals/Pain Pain Assessment Pain  Assessment: 0-10 Pain Score: 8  Pain Location: left side of body, chest, head Pain Descriptors / Indicators: Sore, Discomfort, Constant, Sharp Pain Intervention(s): Monitored during session, Repositioned, Limited activity within patient's tolerance, Premedicated before session    Home Living                          Prior Function            PT Goals (current goals can now be found in the care plan section) Progress towards PT goals: Progressing toward  goals    Frequency    Min 3X/week      PT Plan Current plan remains appropriate    Co-evaluation              AM-PAC PT "6 Clicks" Mobility   Outcome Measure  Help needed turning from your back to your side while in a flat bed without using bedrails?: None Help needed moving from lying on your back to sitting on the side of a flat bed without using bedrails?: None Help needed moving to and from a bed to a chair (including a wheelchair)?: A Little Help needed standing up from a chair using your arms (e.g., wheelchair or bedside chair)?: A Little Help needed to walk in hospital room?: A Little Help needed climbing 3-5 steps with a railing? : A Little 6 Click Score: 20    End of Session Equipment Utilized During Treatment: Gait belt Activity Tolerance: Patient limited by pain;Patient tolerated treatment well Patient left: in chair;with call bell/phone within reach Nurse Communication: Mobility status PT Visit Diagnosis: Other abnormalities of gait and mobility (R26.89);Muscle weakness (generalized) (M62.81);History of falling (Z91.81)     Time: 5916-3846 PT Time Calculation (min) (ACUTE ONLY): 18 min  Charges:  $Gait Training: 8-22 mins                     Marisa Severin, PT, DPT Acute Rehabilitation Services Secure chat preferred Office [place] 07/08/2022, 9:37 AM

## 2022-07-08 NOTE — TOC Transition Note (Signed)
Three (3) discharge meds stored in the [place] for patient until discharge

## 2022-07-08 NOTE — TOC Transition Note (Signed)
Transition of Care [facility]) - CM/SW Discharge Note   Patient Details  Name: Michael [name] MRN: [id number] Date of Birth: [birth year]  [facility]) CM/SW Contact:  Milas Gain, [place] Phone Number: 07/08/2022, 11:54 AM   Clinical Narrative:      Patient will DC to: [facility]  Anticipated DC date: 07/08/2022  Family notified: Patient declined  Transport by: [employer]  ?  Per MD patient ready for DC to [facility] . RN and patient, notified of DC. [employer] requested for patient.  CSW signing off.    Barriers to Discharge: Continued Medical Work up   Patient Goals and CMS Choice Patient states their goals for this hospitalization and ongoing recovery are:: patient is calling shelters for bed availability   Choice offered to / list presented to : NA  Discharge Placement                       Discharge Plan and Services In-house Referral: PCP / Health Connect Discharge Planning Services: [facility], CM Consult Post Acute Care Choice: NA          DME Arranged: N/A DME Agency: NA         HH Agency:  (Unable to arrange [facility] no address.)        Social Determinants of Health (SDOH) Interventions Food Insecurity Interventions: Other (Comment) (Food resources delivered) Housing Interventions: Other (Comment) (area shelter resources given) Transportation Interventions: Other (Comment) (Will assist with tranportation at dc)   Readmission Risk Interventions    07/05/2022   10:08 AM 12/09/2019    1:46 PM  Readmission Risk Prevention Plan  Transportation Screening Complete Complete  Medication Review Press photographer) Complete Complete  PCP or Specialist appointment within 3-5 days of discharge Complete Complete  HRI or Home Care Consult Complete Complete  SW Recovery Care/Counseling Consult Complete Complete  Palliative Care Screening Not Applicable Not [place] Not Applicable Not Applicable

## 2022-07-09 ENCOUNTER — Other Ambulatory Visit (HOSPITAL_COMMUNITY): Payer: Self-pay

## 2022-07-09 ENCOUNTER — Telehealth: Payer: Self-pay

## 2022-07-09 NOTE — Telephone Encounter (Signed)
Transition Care Management Unsuccessful Follow-up Telephone Call  Date of discharge and from where:  07/08/2022, [facility]  Attempts:  1st Attempt  Reason for unsuccessful TCM follow-up call:  Unable to reach patient I called [phone] twice and the recording stated that the call cannot be completed at this time

## 2022-07-10 ENCOUNTER — Telehealth: Payer: Self-pay

## 2022-07-10 NOTE — Telephone Encounter (Signed)
Transition Care Management Unsuccessful Follow-up Telephone Call  Date of discharge and from where:  07/08/2022, [facility]  Attempts:  2nd Attempt  Reason for unsuccessful TCM follow-up call:  Unable to reach patient- I called [phone]  and the recording continues to state that the call cannot be completed at this time  He has an appointment with Juluis Mire, NP at [facility] - 07/25/2022.

## 2022-07-11 ENCOUNTER — Telehealth: Payer: Self-pay

## 2022-07-11 NOTE — Telephone Encounter (Signed)
Transition Care Management Unsuccessful Follow-up Telephone Call  Date of discharge and from where:  07/08/2022, [facility]   Attempts:  3rd Attempt  Reason for unsuccessful TCM follow-up call:  Unable to reach patient- I called [phone]  and the recording continues to state that the call cannot be completed at this time  He has an appointment with Juluis Mire, NP at [facility] - 07/25/2022.

## 2022-07-12 ENCOUNTER — Other Ambulatory Visit: Payer: Self-pay

## 2022-07-12 ENCOUNTER — Emergency Department (HOSPITAL_COMMUNITY): Payer: Medicare Other

## 2022-07-12 ENCOUNTER — Emergency Department (HOSPITAL_COMMUNITY)
Admission: EM | Admit: 2022-07-12 | Discharge: 2022-07-12 | Disposition: A | Discharge status: Home / Self Care | Payer: Medicare Other | Attending: Emergency Medicine | Admitting: Emergency Medicine

## 2022-07-12 ENCOUNTER — Encounter (HOSPITAL_COMMUNITY): Payer: Self-pay | Admitting: Emergency Medicine

## 2022-07-12 DIAGNOSIS — R0602 Shortness of breath: Secondary | ICD-10-CM | POA: Diagnosis not present

## 2022-07-12 DIAGNOSIS — I160 Hypertensive urgency: Secondary | ICD-10-CM

## 2022-07-12 DIAGNOSIS — I129 Hypertensive chronic kidney disease with stage 1 through stage 4 chronic kidney disease, or unspecified chronic kidney disease: Secondary | ICD-10-CM | POA: Diagnosis not present

## 2022-07-12 DIAGNOSIS — Z794 Long term (current) use of insulin: Secondary | ICD-10-CM | POA: Diagnosis not present

## 2022-07-12 DIAGNOSIS — Z79899 Other long term (current) drug therapy: Secondary | ICD-10-CM | POA: Insufficient documentation

## 2022-07-12 DIAGNOSIS — R531 Weakness: Secondary | ICD-10-CM

## 2022-07-12 DIAGNOSIS — N189 Chronic kidney disease, unspecified: Secondary | ICD-10-CM | POA: Diagnosis not present

## 2022-07-12 DIAGNOSIS — Z7982 Long term (current) use of aspirin: Secondary | ICD-10-CM | POA: Diagnosis not present

## 2022-07-12 LAB — URINALYSIS, ROUTINE W REFLEX MICROSCOPIC
Bilirubin Urine: NEGATIVE
Glucose, UA: NEGATIVE mg/dL
Ketones, ur: NEGATIVE mg/dL
Leukocytes,Ua: NEGATIVE
Nitrite: NEGATIVE
Protein, ur: 100 mg/dL — AB
Specific Gravity, Urine: 1.014 (ref 1.005–1.030)
pH: 5 (ref 5.0–8.0)

## 2022-07-12 LAB — COMPREHENSIVE METABOLIC PANEL
ALT: 29 U/L (ref 0–44)
AST: 57 U/L — ABNORMAL HIGH (ref 15–41)
Albumin: 3.5 g/dL (ref 3.5–5.0)
Alkaline Phosphatase: 78 U/L (ref 38–126)
Anion gap: 14 (ref 5–15)
BUN: 70 mg/dL — ABNORMAL HIGH (ref 8–23)
CO2: 21 mmol/L — ABNORMAL LOW (ref 22–32)
Calcium: 8.8 mg/dL — ABNORMAL LOW (ref 8.9–10.3)
Chloride: 103 mmol/L (ref 98–111)
Creatinine, Ser: 4.87 mg/dL — ABNORMAL HIGH (ref 0.61–1.24)
GFR, Estimated: 12 mL/min — ABNORMAL LOW (ref >60)
Glucose, Bld: 238 mg/dL — ABNORMAL HIGH (ref 70–99)
Potassium: 4.5 mmol/L (ref 3.5–5.1)
Sodium: 138 mmol/L (ref 135–145)
Total Bilirubin: 0.4 mg/dL (ref 0.3–1.2)
Total Protein: 6.7 g/dL (ref 6.5–8.1)

## 2022-07-12 LAB — CBC WITH DIFFERENTIAL/PLATELET
Abs Immature Granulocytes: 0.03 10*3/uL (ref 0.00–0.07)
Basophils Absolute: 0 10*3/uL (ref 0.0–0.1)
Basophils Relative: 0 %
Eosinophils Absolute: 0.1 10*3/uL (ref 0.0–0.5)
Eosinophils Relative: 2 %
HCT: 27.5 % — ABNORMAL LOW (ref 39.0–52.0)
Hemoglobin: 9.3 g/dL — ABNORMAL LOW (ref 13.0–17.0)
Immature Granulocytes: 1 %
Lymphocytes Relative: 16 %
Lymphs Abs: 0.8 10*3/uL (ref 0.7–4.0)
MCH: 28.9 pg (ref 26.0–34.0)
MCHC: 33.8 g/dL (ref 30.0–36.0)
MCV: 85.4 fL (ref 80.0–100.0)
Monocytes Absolute: 0.4 10*3/uL (ref 0.1–1.0)
Monocytes Relative: 9 %
Neutro Abs: 3.6 10*3/uL (ref 1.7–7.7)
Neutrophils Relative %: 72 %
Platelets: 135 10*3/uL — ABNORMAL LOW (ref 150–400)
RBC: 3.22 MIL/uL — ABNORMAL LOW (ref 4.22–5.81)
RDW: 13.4 % (ref 11.5–15.5)
WBC: 5 10*3/uL (ref 4.0–10.5)
nRBC: 0 % (ref 0.0–0.2)

## 2022-07-12 LAB — LIPASE, BLOOD: Lipase: 57 U/L — ABNORMAL HIGH (ref 11–51)

## 2022-07-12 LAB — TROPONIN I (HIGH SENSITIVITY): Troponin I (High Sensitivity): 18 ng/L — ABNORMAL HIGH (ref <18)

## 2022-07-12 MED ORDER — INSULIN GLARGINE-YFGN 100 UNIT/ML ~~LOC~~ SOLN
6.0000 [IU] | Freq: Every day | SUBCUTANEOUS | Status: DC
  Filled 2022-07-12: qty 0.06

## 2022-07-12 MED ORDER — DOLUTEGRAVIR-RILPIVIRINE 50-25 MG PO TABS: 1.0000 | ORAL_TABLET | Freq: Every day | ORAL | Status: DC

## 2022-07-12 MED ORDER — DILTIAZEM HCL ER COATED BEADS 120 MG PO CP24
120.0000 mg | ORAL_CAPSULE | Freq: Two times a day (BID) | ORAL | Status: DC
  Filled 2022-07-12: qty 1

## 2022-07-12 MED ORDER — SEVELAMER CARBONATE 800 MG PO TABS
800.0000 mg | ORAL_TABLET | Freq: Three times a day (TID) | ORAL | Status: DC
  Administered 2022-07-12: 800 mg via ORAL
  Filled 2022-07-12: qty 1

## 2022-07-12 MED ORDER — CLOPIDOGREL BISULFATE 75 MG PO TABS
75.0000 mg | ORAL_TABLET | Freq: Every day | ORAL | Status: DC
  Administered 2022-07-12: 75 mg via ORAL
  Filled 2022-07-12: qty 1

## 2022-07-12 MED ORDER — AMLODIPINE BESYLATE 5 MG PO TABS
10.0000 mg | ORAL_TABLET | Freq: Every day | ORAL | Status: DC
  Administered 2022-07-12: 10 mg via ORAL
  Filled 2022-07-12: qty 2

## 2022-07-12 MED ORDER — APIXABAN 5 MG PO TABS: 5.0000 mg | ORAL_TABLET | Freq: Two times a day (BID) | ORAL | Status: DC

## 2022-07-12 NOTE — ED Provider Notes (Signed)
[facility] EMERGENCY DEPARTMENT Provider Note   CSN: [id number] Arrival date & time: 07/12/22  1159    History  Chief Complaint  Patient presents with   Shortness of Breath   Weakness   Suicidal    Michael [name] is a 71 y.o. male.  HPI Patient presents with multiple concerns.  He acknowledges multiple medical problems including HIV, cardiac disease, hypertension, chronic kidney disease.  Acknowledges that he is not taking his medication regularly.  According to police the patient walked into the department and requested 911 call.  At that point is complaining of diarrhea and body aches as well.  Patient acknowledges using crack cocaine yesterday, also describes suicidal ideation.    Home Medications Prior to Admission medications   Medication Sig Start Date End Date Taking? Authorizing Provider  albuterol (VENTOLIN HFA) 108 (90 Base) MCG/ACT inhaler Inhale 1-2 puffs into the lungs every 6 (six) hours as needed for wheezing or shortness of breath. 10/24/21  Yes [name], MD  amLODipine (NORVASC) 10 MG tablet Take 1 tablet (10 mg total) by mouth daily. 07/05/22 07/05/23 Yes Nooruddin, Marlene Lard, MD  aspirin EC 81 MG tablet Take 81 mg by mouth daily. Swallow whole.   Yes [provider]  atorvastatin (LIPITOR) 40 MG tablet Take 40 mg by mouth daily. 06/27/22  Yes [provider]  clopidogrel (PLAVIX) 75 MG tablet Take 1 tablet (75 mg total) by mouth daily. 07/06/22  Yes Nooruddin, Marlene Lard, MD  diltiazem (CARDIZEM CD) 120 MG 24 hr capsule Take 120 mg by mouth 2 (two) times daily. 06/28/22  Yes [provider]  dolutegravir-rilpivirine (JULUCA) 50-25 MG tablet Take 1 tablet by mouth daily before lunch. 07/06/22  Yes Nooruddin, Marlene Lard, MD  FLUoxetine (PROZAC) 20 MG capsule Take 20 mg by mouth daily. 06/27/22  Yes [provider]  gabapentin (NEURONTIN) 300 MG capsule Take 1 capsule (300 mg total) by mouth at bedtime. 07/05/22  Yes  Nooruddin, Marlene Lard, MD  INCRUSE ELLIPTA 62.5 MCG/ACT AEPB Inhale 1 puff into the lungs daily. 06/27/22  Yes [provider]  insulin glargine (LANTUS SOLOSTAR) 100 UNIT/ML Solostar Pen Inject 6 Units into the skin daily. 07/05/22  Yes Nooruddin, Marlene Lard, MD  insulin lispro (HUMALOG KWIKPEN) 100 UNIT/ML KwikPen Inject 4 Units into the skin 3 (three) times daily with meals. 10/24/21  Yes [name], MD  nitroGLYCERIN (NITROSTAT) 0.4 MG SL tablet Place 1 tablet (0.4 mg total) under the tongue every 5 (five) minutes as needed for chest pain. 10/24/21  Yes [name], MD  pantoprazole (PROTONIX) 40 MG tablet Take 1 tablet (40 mg total) by mouth daily as needed (for heartburn). 10/24/21  Yes [name], MD  Semaglutide,0.25 or 0.'5MG'$ /DOS, 2 MG/3ML SOPN Inject 0.5 mg into the skin once a week. 07/05/22  Yes Nooruddin, Marlene Lard, MD  sevelamer carbonate (RENVELA) 800 MG tablet Take 800 mg by mouth 3 (three) times daily. 06/27/22  Yes [provider]  apixaban (ELIQUIS) 5 MG TABS tablet Take 1 tablet (5 mg total) by mouth 2 (two) times daily. 07/05/22   Nooruddin, Marlene Lard, MD  budesonide-formoterol [facility]) 160-4.5 MCG/ACT inhaler Inhale 2 puffs into the lungs in the morning and at bedtime. Patient not taking: Reported on 07/02/2022 10/24/21   [name], MD  buPROPion (WELLBUTRIN XL) 150 MG 24 hr tablet Take 1 tablet (150 mg total) by mouth every morning. Patient not taking: Reported on 07/02/2022 10/24/21   [name], MD  Insulin  Pen Needle 31G X 5 MM MISC Use with insulin pens Patient taking differently: 1 each by Other route See admin instructions. Use with insulin pens 10/24/21   [name], MD  traZODone (DESYREL) 100 MG tablet Take 100 mg by mouth at bedtime.    [provider]      Allergies    Patient has no known allergies.    Review of Systems   Review of Systems  All other systems reviewed and are negative.   Physical Exam Updated Vital  Signs BP (!) 163/92   Pulse 73   Temp 98 F (36.7 C) (Oral)   Resp 16   Ht '5\' 8"'$  (1.727 m)   Wt 71.7 kg   SpO2 100%   BMI 24.02 kg/m  Physical Exam Vitals and nursing note reviewed.  Constitutional:      General: He is not in acute distress.    Appearance: He is well-developed.  HENT:     Head: Normocephalic and atraumatic.  Eyes:     Conjunctiva/sclera: Conjunctivae normal.  Cardiovascular:     Rate and Rhythm: Normal rate and regular rhythm.  Pulmonary:     Effort: Pulmonary effort is normal. No respiratory distress.     Breath sounds: No stridor.  Abdominal:     General: There is no distension.  Skin:    General: Skin is warm and dry.  Neurological:     Mental Status: He is alert and oriented to person, place, and time.  Psychiatric:        Thought Content: Thought content includes suicidal ideation. Thought content does not include suicidal plan.     ED Results / Procedures / Treatments   Labs (all labs ordered are listed, but only abnormal results are displayed) Labs Reviewed  CBC WITH DIFFERENTIAL/PLATELET - Abnormal; Notable for the following components:      Result Value   RBC 3.22 (*)    Hemoglobin 9.3 (*)    HCT 27.5 (*)    Platelets 135 (*)    All other components within normal limits  URINALYSIS, ROUTINE W REFLEX MICROSCOPIC - Abnormal; Notable for the following components:   Hgb urine dipstick MODERATE (*)    Protein, ur 100 (*)    Bacteria, UA RARE (*)    All other components within normal limits  COMPREHENSIVE METABOLIC PANEL - Abnormal; Notable for the following components:   CO2 21 (*)    Glucose, Bld 238 (*)    BUN 70 (*)    Creatinine, Ser 4.87 (*)    Calcium 8.8 (*)    AST 57 (*)    GFR, Estimated 12 (*)    All other components within normal limits  LIPASE, BLOOD - Abnormal; Notable for the following components:   Lipase 57 (*)    All other components within normal limits  TROPONIN I (HIGH SENSITIVITY) - Abnormal; Notable for the  following components:   Troponin I (High Sensitivity) 18 (*)    All other components within normal limits    EKG None  Radiology DG Chest 2 View  Result Date: 07/12/2022 CLINICAL DATA:  Shortness of breath, cough EXAM: CHEST - 2 VIEW COMPARISON:  Previous studies including the examination of 07/02/2022 FINDINGS: Cardiac size is within normal limits. Thoracic aorta is tortuous. There is evidence of previous cardiac surgery. There is a metallic clamp in the region of left atrial appendage. Possible coronary artery calcifications are seen. Lung fields are clear of any infiltrates or pulmonary edema. There  is no significant pleural effusion or pneumothorax. There is surgical fusion in cervical spine. Degenerative changes are noted in both AC joints. IMPRESSION: No active cardiopulmonary disease. Electronically Signed   By: Elmer Picker M.D.   On: 07/12/2022 13:06    Procedures Procedures    Medications Ordered in ED Medications  amLODipine (NORVASC) tablet 10 mg (10 mg Oral Given 07/12/22 1652)  apixaban (ELIQUIS) tablet 5 mg (has no administration in time range)  clopidogrel (PLAVIX) tablet 75 mg (75 mg Oral Given 07/12/22 1652)  diltiazem (CARDIZEM CD) 24 hr capsule 120 mg (has no administration in time range)  dolutegravir-rilpivirine (JULUCA) 50-25 MG per tablet 1 tablet (has no administration in time range)  insulin glargine-yfgn (SEMGLEE) injection 6 Units (has no administration in time range)  sevelamer carbonate (RENVELA) tablet 800 mg (800 mg Oral Given 07/12/22 1652)    ED Course/ Medical Decision Making/ A&P This patient with a Hx of HIV, hypertension, chronic kidney disease, homelessness, crack cocaine use presents to the ED for concern of notable complaints including diffuse discomfort, suicidal ideation, this involves an extensive number of treatment options, and is a complaint that carries with it a high risk of complications and morbidity.    The differential diagnosis  includes hypertensive crisis, medication noncompliance, suicidal ideation, ACS, pneumonia   Social Determinants of Health:  Homelessness, HIV disease  Additional history obtained:  Additional history and/or information obtained from chart review, notable for discharge summary from 4 days ago, with behavioral health notes during that visit as well as well as notes from outside hospital.  During his most recent visit he was seen, evaluated by behavioral health, he was noted to have not been taking his medication regularly, and on discharge was prescribed medication which according to pharmacy chart he has not yet picked up.   After the initial evaluation, orders, including: Labs x-ray monitoring were initiated.   Patient placed on Cardiac and Pulse-Oximetry Monitors. The patient was maintained on a cardiac monitor.  The cardiac monitored showed an rhythm of 75 sinus normal The patient was also maintained on pulse oximetry. The readings were typically 100% room air normal   On repeat evaluation of the patient improved 6:47 PM Patient awake, alert, sitting up eating a sandwich.  Blood pressure has improved, -15% since arrival Lab Tests:  I personally interpreted labs.  The pertinent results include:  he does have known CKD, and his numbers are slightly worse than prior.  Imaging Studies ordered:  I independently visualized and interpreted imaging which showed no pneumonia I agree with the radiologist interpretation   Dispostion / Final MDM:  After consideration of the diagnostic results and the patient's response to treatment, this adult male with multiple medical problems, and frequent ED visits both here and [place] presents with multiple concerns.  His concerns are similar to those expressed several days ago resulting in admission.  That admission results were reviewed, as were his notes from social work facilitating assistance, and medication notes with provision of  meds to be picked up on discharge.  Here patient is awake, alert, does have some mild hypertension, this improves after he was provided his home dosing, there is no evidence for distress, he is set upright, eating a sandwich on repeat evaluation.  We discussed the importance of following up with his clinic, obtaining his medications which have been previously prescribed and are waiting for him at our pharmacy.  Finally, the patient does have ongoing mention of suicidal ideation  in notes from discharge, and recent outside hospital notes, this is chronic patient has no discrete plan, notes that he is comfortable with discharge.   Final Clinical Impression(s) / ED Diagnoses Final diagnoses:  Weakness  Hypertensive urgency    Rx / DC Orders ED Discharge Orders     None         Carmin Muskrat, MD 07/12/22 1851

## 2022-07-12 NOTE — ED Triage Notes (Signed)
Per [facility] pt coming [place] [facility] [place]- patient walked in there and requested them to call 911. Patient c/o diarrhea and body aches over week. Also reports chest pain and shortness of breath x 1 year. Reports smoking crack last night and is suicidal.

## 2022-07-12 NOTE — ED Notes (Addendum)
Reports intermittent chest pain since last year from after CABG. Also reports shortness of breath x 3 weeks ago. SOB on exertion and O2 at 100% on RA. Pt also reports SI with no plan x 3 weeks with no specific plan. Pt was asked by RN what is his recent stress. Pt states, "Everything. I was told I need dialysis. I have family issues. I also see a man all the time in a black suit. He just disappears after a few minutes." Reports hx of bipolar disorder but not on medications. Alert and oriented x 4. Cardiac monitoring in place.

## 2022-07-12 NOTE — ED Provider Triage Note (Signed)
Emergency Medicine Provider Triage Evaluation Note  Michael [name] , a 71 y.o. male  was evaluated in triage.  Pt presenting with multiple bodily complaints.  Endorses worsening nausea and upper respiratory symptoms over the last few days, and some constipation followed by an episode of diarrhea this morning.  Denies bloody bowel movements.  Endorses subjective fevers and possible chills.  Nausea chronic over the last 6 to 12 months.  Also with chronic intermittent shortness of breath and chest pain over the last 1 to 2 years.  Complex Hx of HIV, uncontrolled type II DM with neuropathy, HTN, asthma, fibromyalgia, hepatitis C, and CKD stage III.  Admits to cocaine abuse, and recent use yesterday afternoon.  Endorses SI earlier today, unsure whether with SI right now.  Review of Systems  Positive:  Negative: See above  Physical Exam  BP 135/74 (BP Location: Right Arm)   Pulse 73   Temp 98.3 F (36.8 C) (Oral)   Resp 17   Ht '5\' 8"'$  (1.727 m)   Wt 71.7 kg   SpO2 100%   BMI 24.02 kg/m  Gen:   Awake, no distress   Resp:  Normal effort, equal chest rise, without evidence of acute respiratory distress MSK:   Moves extremities without difficulty  Other:  Abdomen soft, non-TTP.  Reports earlier SI.  Chest non-TTP.  Medical Decision Making  Medically screening exam initiated at 12:42 PM.  Appropriate orders placed.  [name] was informed that the remainder of the evaluation will be completed by another provider, this initial triage assessment does not replace that evaluation, and the importance of remaining in the ED until their evaluation is complete.     Prince Rome, PA-C 75/10/25 1246

## 2022-07-12 NOTE — Discharge Instructions (Signed)
As discussed, today's evaluation has been generally reassuring and with provision of your typical medications your blood pressure has returned to a normal level.  It is very important that you obtain and take the medication prescribed you on discharge 4 days ago.  Equally important that you follow-up with your physician tomorrow.  Return here for concerning changes in your condition.

## 2022-07-12 NOTE — ED Notes (Signed)
Pt asked RN for a taxi voucher to [employer] bus terminal. Pt has no concrete address to go to. Denies SI thoughts now. Alert and oriented x 4. RN provided pt with bus ticket and meal bag to take home. Cleared for discharge by Dr.Lockwood.

## 2022-07-25 ENCOUNTER — Inpatient Hospital Stay (INDEPENDENT_AMBULATORY_CARE_PROVIDER_SITE_OTHER): Payer: Medicare Other | Admitting: Primary Care

## 2022-07-30 ENCOUNTER — Other Ambulatory Visit (HOSPITAL_COMMUNITY): Payer: Self-pay

## 2022-07-30 ENCOUNTER — Telehealth (HOSPITAL_COMMUNITY): Payer: Self-pay

## 2022-07-30 NOTE — Telephone Encounter (Signed)
Transitions of Care Pharmacy   Call attempted for a pharmacy transitions of care follow-up. Spoke with his sister who said she is unsure how we could get in touch with him. She stated that he does not have a phone and the number on his profile is hers.   Maryan Puls, PharmD PGY-1 [facility] Pharmacy Resident

## 2022-08-10 ENCOUNTER — Encounter (HOSPITAL_COMMUNITY): Payer: Self-pay | Admitting: Emergency Medicine

## 2022-08-10 ENCOUNTER — Emergency Department (HOSPITAL_COMMUNITY): Payer: Medicare Other

## 2022-08-10 ENCOUNTER — Emergency Department (HOSPITAL_COMMUNITY)
Admission: EM | Admit: 2022-08-10 | Discharge: 2022-08-10 | Disposition: A | Discharge status: Other Acute Inpatient Hospital | Payer: Medicare Other | Attending: Emergency Medicine | Admitting: Emergency Medicine

## 2022-08-10 ENCOUNTER — Other Ambulatory Visit: Payer: Self-pay

## 2022-08-10 ENCOUNTER — Encounter (HOSPITAL_COMMUNITY): Payer: Self-pay | Admitting: *Deleted

## 2022-08-10 ENCOUNTER — Emergency Department (EMERGENCY_DEPARTMENT_HOSPITAL)
Admission: EM | Admit: 2022-08-10 | Discharge: 2022-08-14 | Disposition: A | Discharge status: Other Acute Inpatient Hospital | Payer: Medicare Other | Source: Home / Self Care | Attending: Emergency Medicine | Admitting: Emergency Medicine

## 2022-08-10 DIAGNOSIS — N289 Disorder of kidney and ureter, unspecified: Secondary | ICD-10-CM | POA: Insufficient documentation

## 2022-08-10 DIAGNOSIS — F332 Major depressive disorder, recurrent severe without psychotic features: Secondary | ICD-10-CM | POA: Insufficient documentation

## 2022-08-10 DIAGNOSIS — Z794 Long term (current) use of insulin: Secondary | ICD-10-CM | POA: Insufficient documentation

## 2022-08-10 DIAGNOSIS — Z79899 Other long term (current) drug therapy: Secondary | ICD-10-CM | POA: Diagnosis not present

## 2022-08-10 DIAGNOSIS — F1490 Cocaine use, unspecified, uncomplicated: Secondary | ICD-10-CM | POA: Insufficient documentation

## 2022-08-10 DIAGNOSIS — R0789 Other chest pain: Secondary | ICD-10-CM | POA: Insufficient documentation

## 2022-08-10 DIAGNOSIS — R072 Precordial pain: Secondary | ICD-10-CM

## 2022-08-10 DIAGNOSIS — I4891 Unspecified atrial fibrillation: Secondary | ICD-10-CM | POA: Insufficient documentation

## 2022-08-10 DIAGNOSIS — R0602 Shortness of breath: Secondary | ICD-10-CM | POA: Insufficient documentation

## 2022-08-10 DIAGNOSIS — I2510 Atherosclerotic heart disease of native coronary artery without angina pectoris: Secondary | ICD-10-CM | POA: Diagnosis not present

## 2022-08-10 DIAGNOSIS — E119 Type 2 diabetes mellitus without complications: Secondary | ICD-10-CM | POA: Diagnosis not present

## 2022-08-10 DIAGNOSIS — Z7901 Long term (current) use of anticoagulants: Secondary | ICD-10-CM | POA: Insufficient documentation

## 2022-08-10 DIAGNOSIS — Z7982 Long term (current) use of aspirin: Secondary | ICD-10-CM | POA: Insufficient documentation

## 2022-08-10 DIAGNOSIS — Z21 Asymptomatic human immunodeficiency virus [HIV] infection status: Secondary | ICD-10-CM | POA: Insufficient documentation

## 2022-08-10 DIAGNOSIS — Z7951 Long term (current) use of inhaled steroids: Secondary | ICD-10-CM | POA: Insufficient documentation

## 2022-08-10 DIAGNOSIS — J45909 Unspecified asthma, uncomplicated: Secondary | ICD-10-CM | POA: Diagnosis not present

## 2022-08-10 DIAGNOSIS — Z5900 Homelessness unspecified: Secondary | ICD-10-CM | POA: Insufficient documentation

## 2022-08-10 DIAGNOSIS — R45851 Suicidal ideations: Secondary | ICD-10-CM | POA: Insufficient documentation

## 2022-08-10 DIAGNOSIS — I10 Essential (primary) hypertension: Secondary | ICD-10-CM | POA: Insufficient documentation

## 2022-08-10 DIAGNOSIS — Z20822 Contact with and (suspected) exposure to covid-19: Secondary | ICD-10-CM | POA: Insufficient documentation

## 2022-08-10 DIAGNOSIS — F1410 Cocaine abuse, uncomplicated: Secondary | ICD-10-CM | POA: Diagnosis present

## 2022-08-10 DIAGNOSIS — D61818 Other pancytopenia: Secondary | ICD-10-CM

## 2022-08-10 DIAGNOSIS — Z7902 Long term (current) use of antithrombotics/antiplatelets: Secondary | ICD-10-CM | POA: Diagnosis not present

## 2022-08-10 DIAGNOSIS — R079 Chest pain, unspecified: Secondary | ICD-10-CM | POA: Diagnosis present

## 2022-08-10 LAB — COMPREHENSIVE METABOLIC PANEL
ALT: 12 U/L (ref 0–44)
AST: 14 U/L — ABNORMAL LOW (ref 15–41)
Albumin: 3.6 g/dL (ref 3.5–5.0)
Alkaline Phosphatase: 70 U/L (ref 38–126)
Anion gap: 6 (ref 5–15)
BUN: 45 mg/dL — ABNORMAL HIGH (ref 8–23)
CO2: 23 mmol/L (ref 22–32)
Calcium: 8.3 mg/dL — ABNORMAL LOW (ref 8.9–10.3)
Chloride: 110 mmol/L (ref 98–111)
Creatinine, Ser: 3.66 mg/dL — ABNORMAL HIGH (ref 0.61–1.24)
GFR, Estimated: 17 mL/min — ABNORMAL LOW (ref >60)
Glucose, Bld: 145 mg/dL — ABNORMAL HIGH (ref 70–99)
Potassium: 4.3 mmol/L (ref 3.5–5.1)
Sodium: 139 mmol/L (ref 135–145)
Total Bilirubin: 0.6 mg/dL (ref 0.3–1.2)
Total Protein: 6.7 g/dL (ref 6.5–8.1)

## 2022-08-10 LAB — CBC WITH DIFFERENTIAL/PLATELET
Abs Immature Granulocytes: 0.01 10*3/uL (ref 0.00–0.07)
Basophils Absolute: 0 10*3/uL (ref 0.0–0.1)
Basophils Relative: 1 %
Eosinophils Absolute: 0.1 10*3/uL (ref 0.0–0.5)
Eosinophils Relative: 4 %
HCT: 29.3 % — ABNORMAL LOW (ref 39.0–52.0)
Hemoglobin: 9.4 g/dL — ABNORMAL LOW (ref 13.0–17.0)
Immature Granulocytes: 0 %
Lymphocytes Relative: 33 %
Lymphs Abs: 1 10*3/uL (ref 0.7–4.0)
MCH: 28.1 pg (ref 26.0–34.0)
MCHC: 32.1 g/dL (ref 30.0–36.0)
MCV: 87.5 fL (ref 80.0–100.0)
Monocytes Absolute: 0.3 10*3/uL (ref 0.1–1.0)
Monocytes Relative: 9 %
Neutro Abs: 1.6 10*3/uL — ABNORMAL LOW (ref 1.7–7.7)
Neutrophils Relative %: 53 %
Platelets: 148 10*3/uL — ABNORMAL LOW (ref 150–400)
RBC: 3.35 MIL/uL — ABNORMAL LOW (ref 4.22–5.81)
RDW: 13.1 % (ref 11.5–15.5)
WBC: 3 10*3/uL — ABNORMAL LOW (ref 4.0–10.5)
nRBC: 0 % (ref 0.0–0.2)

## 2022-08-10 LAB — CBC
HCT: 28 % — ABNORMAL LOW (ref 39.0–52.0)
Hemoglobin: 8.9 g/dL — ABNORMAL LOW (ref 13.0–17.0)
MCH: 27.7 pg (ref 26.0–34.0)
MCHC: 31.8 g/dL (ref 30.0–36.0)
MCV: 87.2 fL (ref 80.0–100.0)
Platelets: 135 10*3/uL — ABNORMAL LOW (ref 150–400)
RBC: 3.21 MIL/uL — ABNORMAL LOW (ref 4.22–5.81)
RDW: 13.2 % (ref 11.5–15.5)
WBC: 3.4 10*3/uL — ABNORMAL LOW (ref 4.0–10.5)
nRBC: 0 % (ref 0.0–0.2)

## 2022-08-10 LAB — BASIC METABOLIC PANEL
Anion gap: 7 (ref 5–15)
BUN: 44 mg/dL — ABNORMAL HIGH (ref 8–23)
CO2: 22 mmol/L (ref 22–32)
Calcium: 8.3 mg/dL — ABNORMAL LOW (ref 8.9–10.3)
Chloride: 110 mmol/L (ref 98–111)
Creatinine, Ser: 3.93 mg/dL — ABNORMAL HIGH (ref 0.61–1.24)
GFR, Estimated: 15 mL/min — ABNORMAL LOW (ref >60)
Glucose, Bld: 161 mg/dL — ABNORMAL HIGH (ref 70–99)
Potassium: 4.4 mmol/L (ref 3.5–5.1)
Sodium: 139 mmol/L (ref 135–145)

## 2022-08-10 LAB — ETHANOL: Alcohol, Ethyl (B): <10 mg/dL (ref <10)

## 2022-08-10 LAB — TROPONIN I (HIGH SENSITIVITY)
Troponin I (High Sensitivity): 20 ng/L — ABNORMAL HIGH (ref <18)
Troponin I (High Sensitivity): 15 ng/L (ref <18)
Troponin I (High Sensitivity): 13 ng/L (ref <18)

## 2022-08-10 MED ORDER — ALUM & MAG HYDROXIDE-SIMETH 200-200-20 MG/5ML PO SUSP
30.0000 mL | Freq: Once | ORAL | Status: AC
  Administered 2022-08-10: 30 mL via ORAL
  Filled 2022-08-10: qty 30

## 2022-08-10 MED ORDER — ACETAMINOPHEN 500 MG PO TABS
1000.0000 mg | ORAL_TABLET | Freq: Once | ORAL | Status: AC
  Administered 2022-08-10: 1000 mg via ORAL
  Filled 2022-08-10: qty 2

## 2022-08-10 MED ORDER — INSULIN LISPRO (1 UNIT DIAL) 100 UNIT/ML (KWIKPEN): 4.0000 [IU] | PEN_INJECTOR | Freq: Three times a day (TID) | SUBCUTANEOUS | Status: DC

## 2022-08-10 MED ORDER — INSULIN GLARGINE-YFGN 100 UNIT/ML ~~LOC~~ SOLN
6.0000 [IU] | Freq: Every day | SUBCUTANEOUS | Status: DC
  Administered 2022-08-11 – 2022-08-13 (×3): 6 [IU] via SUBCUTANEOUS
  Filled 2022-08-10 (×4): qty 0.06

## 2022-08-10 MED ORDER — APIXABAN 5 MG PO TABS
5.0000 mg | ORAL_TABLET | Freq: Two times a day (BID) | ORAL | Status: DC
  Administered 2022-08-11 – 2022-08-13 (×6): 5 mg via ORAL
  Filled 2022-08-10 (×8): qty 1

## 2022-08-10 MED ORDER — FLUOXETINE HCL 20 MG PO CAPS
20.0000 mg | ORAL_CAPSULE | Freq: Every day | ORAL | Status: DC
  Administered 2022-08-11 – 2022-08-12 (×2): 20 mg via ORAL
  Filled 2022-08-10 (×2): qty 1

## 2022-08-10 MED ORDER — DILTIAZEM HCL ER COATED BEADS 120 MG PO CP24
120.0000 mg | ORAL_CAPSULE | Freq: Two times a day (BID) | ORAL | Status: DC
  Administered 2022-08-11 – 2022-08-13 (×6): 120 mg via ORAL
  Filled 2022-08-10 (×6): qty 1

## 2022-08-10 MED ORDER — TRAZODONE HCL 100 MG PO TABS: 100.0000 mg | ORAL_TABLET | Freq: Every day | ORAL | Status: DC

## 2022-08-10 MED ORDER — PANTOPRAZOLE SODIUM 40 MG PO TBEC: 40.0000 mg | DELAYED_RELEASE_TABLET | Freq: Every day | ORAL | Status: DC | PRN

## 2022-08-10 MED ORDER — CLOPIDOGREL BISULFATE 75 MG PO TABS
75.0000 mg | ORAL_TABLET | Freq: Every day | ORAL | Status: DC
  Administered 2022-08-11 – 2022-08-13 (×3): 75 mg via ORAL
  Filled 2022-08-10 (×4): qty 1

## 2022-08-10 MED ORDER — UMECLIDINIUM BROMIDE 62.5 MCG/ACT IN AEPB
1.0000 | INHALATION_SPRAY | Freq: Every day | RESPIRATORY_TRACT | Status: DC
  Administered 2022-08-12 – 2022-08-13 (×2): 1 via RESPIRATORY_TRACT
  Filled 2022-08-10: qty 7

## 2022-08-10 MED ORDER — ALBUTEROL SULFATE HFA 108 (90 BASE) MCG/ACT IN AERS: 1.0000 | INHALATION_SPRAY | Freq: Four times a day (QID) | RESPIRATORY_TRACT | Status: DC | PRN

## 2022-08-10 MED ORDER — ATORVASTATIN CALCIUM 40 MG PO TABS
40.0000 mg | ORAL_TABLET | Freq: Every day | ORAL | Status: DC
  Administered 2022-08-11 – 2022-08-13 (×3): 40 mg via ORAL
  Filled 2022-08-10 (×3): qty 1

## 2022-08-10 MED ORDER — SEVELAMER CARBONATE 800 MG PO TABS
800.0000 mg | ORAL_TABLET | Freq: Three times a day (TID) | ORAL | Status: DC
  Administered 2022-08-11 – 2022-08-13 (×7): 800 mg via ORAL
  Filled 2022-08-10 (×12): qty 1

## 2022-08-10 MED ORDER — NITROGLYCERIN 0.4 MG SL SUBL: 0.4000 mg | SUBLINGUAL_TABLET | SUBLINGUAL | Status: DC | PRN

## 2022-08-10 MED ORDER — ALBUTEROL SULFATE (2.5 MG/3ML) 0.083% IN NEBU: 2.5000 mg | INHALATION_SOLUTION | Freq: Four times a day (QID) | RESPIRATORY_TRACT | Status: DC | PRN

## 2022-08-10 MED ORDER — ASPIRIN 81 MG PO TBEC
81.0000 mg | DELAYED_RELEASE_TABLET | Freq: Every day | ORAL | Status: DC
  Administered 2022-08-11 – 2022-08-13 (×3): 81 mg via ORAL
  Filled 2022-08-10 (×5): qty 1

## 2022-08-10 MED ORDER — INSULIN ASPART 100 UNIT/ML IJ SOLN
4.0000 [IU] | Freq: Three times a day (TID) | INTRAMUSCULAR | Status: DC
  Administered 2022-08-11 – 2022-08-13 (×7): 4 [IU] via SUBCUTANEOUS
  Filled 2022-08-10: qty 0.04

## 2022-08-10 MED ORDER — DOLUTEGRAVIR-RILPIVIRINE 50-25 MG PO TABS
1.0000 | ORAL_TABLET | Freq: Every day | ORAL | Status: DC
  Filled 2022-08-10: qty 1

## 2022-08-10 MED ORDER — AMLODIPINE BESYLATE 5 MG PO TABS: 10.0000 mg | ORAL_TABLET | Freq: Every day | ORAL | Status: DC

## 2022-08-10 MED ORDER — GABAPENTIN 300 MG PO CAPS: 300.0000 mg | ORAL_CAPSULE | Freq: Every day | ORAL | Status: DC

## 2022-08-10 NOTE — ED Triage Notes (Signed)
Pt reports being SOB for about a month. Pt also reporting to be suicidal without a plan in triage. No wheezing heard in triage.  Pt a/o x 4.

## 2022-08-10 NOTE — ED Provider Notes (Signed)
[facility] DEPT Provider Note   CSN: [id number] Arrival date & time: 08/10/22  1449     History  Chief Complaint  Patient presents with   Suicidal   Shortness of Breath    Michael [name] is a 71 y.o. male.  71 year old male with medical history as detailed below presents for evaluation.  Patient with similar set of complaints early this morning.  Patient was evaluated for those complaints in the ED.  Work-up earlier today was without significant acute abnormality.  Patient complains of persistent chest discomfort.  Additionally, patient now complains of suicidality.  Patient without specific plan.  Patient is requesting mental health evaluation.  Patient appears to be homeless.  He also reports recent crack cocaine use within the last 3 to 4 days.  The history is provided by the patient and medical records.       Home Medications Prior to Admission medications   Medication Sig Start Date End Date Taking? Authorizing Provider  albuterol (VENTOLIN HFA) 108 (90 Base) MCG/ACT inhaler Inhale 1-2 puffs into the lungs every 6 (six) hours as needed for wheezing or shortness of breath. 10/24/21   [name], MD  amLODipine (NORVASC) 10 MG tablet Take 1 tablet (10 mg total) by mouth daily. 07/05/22 07/05/23  Nooruddin, Marlene Lard, MD  apixaban (ELIQUIS) 5 MG TABS tablet Take 1 tablet (5 mg total) by mouth 2 (two) times daily. 07/05/22   Nooruddin, Marlene Lard, MD  aspirin EC 81 MG tablet Take 81 mg by mouth daily. Swallow whole.    [provider]  atorvastatin (LIPITOR) 40 MG tablet Take 40 mg by mouth daily. 06/27/22   [provider]  budesonide-formoterol (SYMBICORT) 160-4.5 MCG/ACT inhaler Inhale 2 puffs into the lungs in the morning and at bedtime. Patient not taking: Reported on 07/02/2022 10/24/21   [name], MD  buPROPion (WELLBUTRIN XL) 150 MG 24 hr tablet Take 1 tablet (150 mg total) by mouth every morning. Patient not taking:  Reported on 07/02/2022 10/24/21   [name], MD  clopidogrel (PLAVIX) 75 MG tablet Take 1 tablet (75 mg total) by mouth daily. 07/06/22   Nooruddin, Marlene Lard, MD  diltiazem (CARDIZEM CD) 120 MG 24 hr capsule Take 120 mg by mouth 2 (two) times daily. 06/28/22   [provider]  dolutegravir-rilpivirine (JULUCA) 50-25 MG tablet Take 1 tablet by mouth daily before lunch. 07/06/22   Nooruddin, Marlene Lard, MD  FLUoxetine (PROZAC) 20 MG capsule Take 20 mg by mouth daily. 06/27/22   [provider]  gabapentin (NEURONTIN) 300 MG capsule Take 1 capsule (300 mg total) by mouth at bedtime. 07/05/22   Nooruddin, Marlene Lard, MD  INCRUSE ELLIPTA 62.5 MCG/ACT AEPB Inhale 1 puff into the lungs daily. 06/27/22   [provider]  insulin glargine (LANTUS SOLOSTAR) 100 UNIT/ML Solostar Pen Inject 6 Units into the skin daily. 07/05/22   Nooruddin, Marlene Lard, MD  insulin lispro (HUMALOG KWIKPEN) 100 UNIT/ML KwikPen Inject 4 Units into the skin 3 (three) times daily with meals. 10/24/21   [name], MD  Insulin Pen Needle 31G X 5 MM MISC Use with insulin pens Patient taking differently: 1 each by Other route See admin instructions. Use with insulin pens 10/24/21   [name], MD  nitroGLYCERIN (NITROSTAT) 0.4 MG SL tablet Place 1 tablet (0.4 mg total) under the tongue every 5 (five) minutes as needed for chest pain. 10/24/21   [name], MD  pantoprazole (PROTONIX) 40 MG tablet Take  1 tablet (40 mg total) by mouth daily as needed (for heartburn). 10/24/21   [name], MD  Semaglutide,0.25 or 0.'5MG'$ /DOS, 2 MG/3ML SOPN Inject 0.5 mg into the skin once a week. 07/05/22   Nooruddin, Marlene Lard, MD  sevelamer carbonate (RENVELA) 800 MG tablet Take 800 mg by mouth 3 (three) times daily. 06/27/22   [provider]  traZODone (DESYREL) 100 MG tablet Take 100 mg by mouth at bedtime.    [provider]      Allergies    Patient has no known allergies.    Review of Systems   Review of  Systems  All other systems reviewed and are negative.   Physical Exam Updated Vital Signs BP (!) 146/79 (BP Location: Left Arm)   Pulse 76   Temp 98.2 F (36.8 C) (Oral)   Resp 16   Ht '5\' 8"'$  (1.727 m)   Wt 72.6 kg   SpO2 100%   BMI 24.33 kg/m  Physical Exam Vitals and nursing note reviewed.  Constitutional:      General: He is not in acute distress.    Appearance: Normal appearance. He is well-developed.  HENT:     Head: Normocephalic and atraumatic.  Eyes:     Conjunctiva/sclera: Conjunctivae normal.     Pupils: Pupils are equal, round, and reactive to light.  Cardiovascular:     Rate and Rhythm: Normal rate and regular rhythm.     Heart sounds: Normal heart sounds.  Pulmonary:     Effort: Pulmonary effort is normal. No respiratory distress.     Breath sounds: Normal breath sounds.  Abdominal:     General: There is no distension.     Palpations: Abdomen is soft.     Tenderness: There is no abdominal tenderness.  Musculoskeletal:        General: No deformity. Normal range of motion.     Cervical back: Normal range of motion and neck supple.  Skin:    General: Skin is warm and dry.  Neurological:     General: No focal deficit present.     Mental Status: He is alert and oriented to person, place, and time.     ED Results / Procedures / Treatments   Labs (all labs ordered are listed, but only abnormal results are displayed) Labs Reviewed  CBC WITH DIFFERENTIAL/PLATELET  ETHANOL  RAPID URINE DRUG SCREEN, HOSP PERFORMED  COMPREHENSIVE METABOLIC PANEL  TROPONIN I (HIGH SENSITIVITY)    EKG None  Radiology DG Chest Port 1 View  Result Date: 08/10/2022 CLINICAL DATA:  Left-sided chest pain. EXAM: PORTABLE CHEST 1 VIEW COMPARISON:  July 12, 2022 FINDINGS: Multiple sternal wires are noted. The heart size and mediastinal contours are within normal limits. A coronary artery stent is seen. Both lungs are clear. A radiopaque fusion plate and screws are seen  overlying the cervical spine. The visualized skeletal structures are unremarkable. IMPRESSION: 1. Evidence of prior median and coronary artery stent placement. 2. No acute cardiopulmonary disease. Electronically Signed   By: Virgina Norfolk M.D.   On: 08/10/2022 04:34    Procedures Procedures    Medications Ordered in ED Medications - No data to display  ED Course/ Medical Decision Making/ A&P                           Medical Decision Making Amount and/or Complexity of Data Reviewed Labs: ordered. Radiology: ordered.  Risk OTC drugs. Prescription drug management.  Medical Screen Complete  This patient presented to the ED with complaint of suicidal ideation.  This complaint involves an extensive number of treatment options. The initial differential diagnosis includes, but is not limited to, mental health emergency, metabolic abnormality, etc.  This presentation is: Acute, Chronic, Self-Limited, Previously Undiagnosed, Uncertain Prognosis, Complicated, Systemic Symptoms, and Threat to Life/Bodily Function  Patient with multiple complaints.  Most concerning complaint is reported suicidal ideation.  Patient without coherent plan for self-harm.  Patient with other multiple nonspecific medical complaints.  Screening labs obtained are without significant abnormality.  Patient would benefit from mental health evaluation.  TTS is aware of case and evaluate for safety.  Final disposition is dependent upon TTS/psych plan of care.  Patient is medically clear for psychiatric evaluation.   Additional history obtained:  External records from outside sources obtained and reviewed including prior ED visits and prior Inpatient records.    Lab Tests:  I ordered and personally interpreted labs.  The pertinent results include: CBC, CMP, troponin, EtOH   Imaging Studies ordered:  I ordered imaging studies including chest x-ray I independently visualized and interpreted  obtained imaging which showed NAD I agree with the radiologist interpretation.   Cardiac Monitoring:  The patient was maintained on a cardiac monitor.  I personally viewed and interpreted the cardiac monitor which showed an underlying rhythm of: NSR   Problem List / ED Course:  Suicidal ideation   Reevaluation:  After the interventions noted above, I reevaluated the patient and found that they have: improved   Disposition:  After consideration of the diagnostic results and the patients response to treatment, I feel that the patent would benefit from psychiatric evaluation.          Final Clinical Impression(s) / ED Diagnoses Final diagnoses:  Suicidal ideation    Rx / DC Orders ED Discharge Orders     None         Valarie Merino, MD 08/10/22 2232

## 2022-08-10 NOTE — ED Provider Notes (Addendum)
[facility] DEPT Provider Note   CSN: [id number] Arrival date & time: 08/10/22  0309     History  Chief Complaint  Patient presents with   Chest Pain    Michael [name] is a 71 y.o. male.  The history is provided by the patient.  Chest Pain Pain location:  L chest Pain quality: dull   Pain radiates to:  Does not radiate Pain severity:  Moderate Onset quality:  Gradual Duration:  12 months Timing:  Constant Progression:  Unchanged Chronicity:  Chronic Context: not breathing, not drug use and not eating   Relieved by:  Nothing Worsened by:  Nothing Ineffective treatments:  None tried Associated symptoms: no fever, no palpitations and no shortness of breath   Risk factors: male sex   Risk factors: no aortic disease   Patient with CAD presents with constant unchanged CP x 1 year.  No exacerbating or alleviating factors.  No associated symptoms.      Past Medical History:  Diagnosis Date   A-fib ([place])    Asthma    No PFTs, history of childhood asthma   CAD (coronary artery disease)    a. 06/2013 STEMI/PCI ([facility]): LAD w/ thrombus (treated with BMS), mid 75%, D2 75%; LCX OM2 75%; RCA small, PDA 95%, PLV 95%;  b. 10/2013 Cath/PCI: ISR w/in LAD (Promus DES x 2), borderline OM2 lesion;  c. 01/2014 MV: Intermediate risk, medium-sized distal ant wall infarct w/ very small amt of peri-infarct ischemia. EF 60%.   Cellulitis 04/2014   left facial   Cellulitis and abscess of toe of right foot 12/08/2019   Chondromalacia of medial femoral condyle    Left knee MRI 04/28/12: Chondromalacia of the medial femoral condyle with slight peripheral degeneration of the meniscocapsular junction of the medial meniscus; followed by sports medicine   Collagen vascular disease ([place])    Crack cocaine use    for 20+ years, has been enrolled in detox programs in the past   Depression    with history of hospitalization for suicidal ideation   Diabetes mellitus 2002    Diagnosed in 2002, started insulin in 2012   Gout    Gout 04/28/2012   Headache(784.0)    CT head 08/2011: Periventricular and subcortical white matter hypodensities are most in keeping with chronic microangiopathic change   HIV infection [facility]) Nov 2012   Followed by [name]   Hyperlipidemia    Hypertension    Pulmonary embolism ([place])     Home Medications Prior to Admission medications   Medication Sig Start Date End Date Taking? Authorizing Provider  albuterol (VENTOLIN HFA) 108 (90 Base) MCG/ACT inhaler Inhale 1-2 puffs into the lungs every 6 (six) hours as needed for wheezing or shortness of breath. 10/24/21   [name], MD  amLODipine (NORVASC) 10 MG tablet Take 1 tablet (10 mg total) by mouth daily. 07/05/22 07/05/23  Nooruddin, Marlene Lard, MD  apixaban (ELIQUIS) 5 MG TABS tablet Take 1 tablet (5 mg total) by mouth 2 (two) times daily. 07/05/22   Nooruddin, Marlene Lard, MD  aspirin EC 81 MG tablet Take 81 mg by mouth daily. Swallow whole.    [provider]  atorvastatin (LIPITOR) 40 MG tablet Take 40 mg by mouth daily. 06/27/22   [provider]  budesonide-formoterol (SYMBICORT) 160-4.5 MCG/ACT inhaler Inhale 2 puffs into the lungs in the morning and at bedtime. Patient not taking: Reported on 07/02/2022 10/24/21   [name], MD  buPROPion (WELLBUTRIN XL)  150 MG 24 hr tablet Take 1 tablet (150 mg total) by mouth every morning. Patient not taking: Reported on 07/02/2022 10/24/21   [name], MD  clopidogrel (PLAVIX) 75 MG tablet Take 1 tablet (75 mg total) by mouth daily. 07/06/22   Nooruddin, Marlene Lard, MD  diltiazem (CARDIZEM CD) 120 MG 24 hr capsule Take 120 mg by mouth 2 (two) times daily. 06/28/22   [provider]  dolutegravir-rilpivirine (JULUCA) 50-25 MG tablet Take 1 tablet by mouth daily before lunch. 07/06/22   Nooruddin, Marlene Lard, MD  FLUoxetine (PROZAC) 20 MG capsule Take 20 mg by mouth daily. 06/27/22   [provider]  gabapentin  (NEURONTIN) 300 MG capsule Take 1 capsule (300 mg total) by mouth at bedtime. 07/05/22   Nooruddin, Marlene Lard, MD  INCRUSE ELLIPTA 62.5 MCG/ACT AEPB Inhale 1 puff into the lungs daily. 06/27/22   [provider]  insulin glargine (LANTUS SOLOSTAR) 100 UNIT/ML Solostar Pen Inject 6 Units into the skin daily. 07/05/22   Nooruddin, Marlene Lard, MD  insulin lispro (HUMALOG KWIKPEN) 100 UNIT/ML KwikPen Inject 4 Units into the skin 3 (three) times daily with meals. 10/24/21   [name], MD  Insulin Pen Needle 31G X 5 MM MISC Use with insulin pens Patient taking differently: 1 each by Other route See admin instructions. Use with insulin pens 10/24/21   [name], MD  nitroGLYCERIN (NITROSTAT) 0.4 MG SL tablet Place 1 tablet (0.4 mg total) under the tongue every 5 (five) minutes as needed for chest pain. 10/24/21   [name], MD  pantoprazole (PROTONIX) 40 MG tablet Take 1 tablet (40 mg total) by mouth daily as needed (for heartburn). 10/24/21   [name], MD  Semaglutide,0.25 or 0.'5MG'$ /DOS, 2 MG/3ML SOPN Inject 0.5 mg into the skin once a week. 07/05/22   Nooruddin, Marlene Lard, MD  sevelamer carbonate (RENVELA) 800 MG tablet Take 800 mg by mouth 3 (three) times daily. 06/27/22   [provider]  traZODone (DESYREL) 100 MG tablet Take 100 mg by mouth at bedtime.    [provider]      Allergies    Patient has no known allergies.    Review of Systems   Review of Systems  Constitutional:  Negative for fever.  HENT:  Negative for facial swelling.   Respiratory:  Negative for shortness of breath, wheezing and stridor.   Cardiovascular:  Positive for chest pain. Negative for palpitations and leg swelling.  All other systems reviewed and are negative.   Physical Exam Updated Vital Signs BP (!) 150/99   Pulse 80   Temp 98.4 F (36.9 C) (Oral)   Resp 17   Ht '5\' 8"'$  (1.727 m)   Wt 72.6 kg   SpO2 100%   BMI 24.33 kg/m  Physical Exam Vitals and nursing note reviewed.   Constitutional:      General: He is not in acute distress.    Appearance: He is well-developed. He is not diaphoretic.  HENT:     Head: Normocephalic and atraumatic.     Nose: Nose normal.  Eyes:     Conjunctiva/sclera: Conjunctivae normal.     Pupils: Pupils are equal, round, and reactive to light.  Cardiovascular:     Rate and Rhythm: Normal rate and regular rhythm.     Pulses: Normal pulses.     Heart sounds: Normal heart sounds.  Pulmonary:     Effort: Pulmonary effort is normal.     Breath sounds: Normal breath sounds.  No wheezing or rales.  Abdominal:     General: Bowel sounds are normal.     Palpations: Abdomen is soft.     Tenderness: There is no abdominal tenderness. There is no guarding or rebound.  Musculoskeletal:        General: Normal range of motion.     Cervical back: Normal range of motion and neck supple.  Skin:    General: Skin is warm and dry.  Neurological:     General: No focal deficit present.     Mental Status: He is alert and oriented to person, place, and time.  Psychiatric:        Mood and Affect: Mood normal.        Behavior: Behavior normal.     ED Results / Procedures / Treatments   Labs (all labs ordered are listed, but only abnormal results are displayed) Results for orders placed or performed during the hospital encounter of 04/01/93  Basic metabolic panel  Result Value Ref Range   Sodium 139 135 - 145 mmol/L   Potassium 4.4 3.5 - 5.1 mmol/L   Chloride 110 98 - 111 mmol/L   CO2 22 22 - 32 mmol/L   Glucose, Bld 161 (H) 70 - 99 mg/dL   BUN 44 (H) 8 - 23 mg/dL   Creatinine, Ser 3.93 (H) 0.61 - 1.24 mg/dL   Calcium 8.3 (L) 8.9 - 10.3 mg/dL   GFR, Estimated 15 (L) >60 mL/min   Anion gap 7 5 - 15  CBC  Result Value Ref Range   WBC 3.4 (L) 4.0 - 10.5 K/uL   RBC 3.21 (L) 4.22 - 5.81 MIL/uL   Hemoglobin 8.9 (L) 13.0 - 17.0 g/dL   HCT 28.0 (L) 39.0 - 52.0 %   MCV 87.2 80.0 - 100.0 fL   MCH 27.7 26.0 - 34.0 pg   MCHC 31.8 30.0 - 36.0  g/dL   RDW 13.2 11.5 - 15.5 %   Platelets 135 (L) 150 - 400 K/uL   nRBC 0.0 0.0 - 0.2 %  Troponin I (High Sensitivity)  Result Value Ref Range   Troponin I (High Sensitivity) 20 (H) <18 ng/L   DG Chest Port 1 View  Result Date: 08/10/2022 CLINICAL DATA:  Left-sided chest pain. EXAM: PORTABLE CHEST 1 VIEW COMPARISON:  July 12, 2022 FINDINGS: Multiple sternal wires are noted. The heart size and mediastinal contours are within normal limits. A coronary artery stent is seen. Both lungs are clear. A radiopaque fusion plate and screws are seen overlying the cervical spine. The visualized skeletal structures are unremarkable. IMPRESSION: 1. Evidence of prior median and coronary artery stent placement. 2. No acute cardiopulmonary disease. Electronically Signed   By: Virgina Norfolk M.D.   On: 08/10/2022 04:34   DG Chest 2 View  Result Date: 07/12/2022 CLINICAL DATA:  Shortness of breath, cough EXAM: CHEST - 2 VIEW COMPARISON:  Previous studies including the examination of 07/02/2022 FINDINGS: Cardiac size is within normal limits. Thoracic aorta is tortuous. There is evidence of previous cardiac surgery. There is a metallic clamp in the region of left atrial appendage. Possible coronary artery calcifications are seen. Lung fields are clear of any infiltrates or pulmonary edema. There is no significant pleural effusion or pneumothorax. There is surgical fusion in cervical spine. Degenerative changes are noted in both AC joints. IMPRESSION: No active cardiopulmonary disease. Electronically Signed   By: Elmer Picker M.D.   On: 07/12/2022 13:06    EKG EKG Interpretation  Date/Time:  Saturday August 10 2022 03:26:47 EDT Ventricular Rate:  85 PR Interval:  137 QRS Duration: 94 QT Interval:  386 QTC Calculation: 459 R Axis:   64 Text Interpretation: Sinus arrhythmia Confirmed by Dory Horn) on 08/10/2022 5:10:34 AM  Radiology DG Chest Port 1 View  Result Date:  08/10/2022 CLINICAL DATA:  Left-sided chest pain. EXAM: PORTABLE CHEST 1 VIEW COMPARISON:  July 12, 2022 FINDINGS: Multiple sternal wires are noted. The heart size and mediastinal contours are within normal limits. A coronary artery stent is seen. Both lungs are clear. A radiopaque fusion plate and screws are seen overlying the cervical spine. The visualized skeletal structures are unremarkable. IMPRESSION: 1. Evidence of prior median and coronary artery stent placement. 2. No acute cardiopulmonary disease. Electronically Signed   By: Virgina Norfolk M.D.   On: 08/10/2022 04:34    Procedures Procedures    Medications Ordered in ED Medications  acetaminophen (TYLENOL) tablet 1,000 mg (1,000 mg Oral Given 08/10/22 0534)  alum & mag hydroxide-simeth (MAALOX/MYLANTA) 200-200-20 MG/5ML suspension 30 mL (30 mLs Oral Given 08/10/22 0534)    ED Course/ Medical Decision Making/ A&P                           Medical Decision Making Chest pain x 1 year  Problems Addressed: Renal insufficiency: chronic illness or injury    Details: Ongoing follow up with pmd and nephrology   Amount and/or Complexity of Data Reviewed External Data Reviewed: labs and notes.    Details: Previous notes reviewed previous troponins reviewed, flat with today  Labs: ordered.    Details: All labs reviewed:  troponin 20, top normal and consistent with previous.  White count low 3.4, hemoglobin low 8.9, platelets low 135.  Normal sodium 139 and potassium.  Renal insufficiency with creatinine 3.93, improved from previous Radiology: ordered and independent interpretation performed.    Details: Negative cxr by me  ECG/medicine tests: ordered and independent interpretation performed. Decision-making details documented in ED Course.  Risk OTC drugs. Risk Details: Troponin is at his baseline.  It has not changes since last time he was seen for this.  It is top normal in the setting of ongoing symptoms of > 24 hours duration.   Also, given renal failure this is to be expected.  I do not believe this is a STEMI, nor an NSTEMI nor ACS of any kind.  I do not believe this is a PE and patient reports compliance with his anticoagulation.  Patient is stable for discharge.  Strict return precautions, follow up with your cardiologist for ongoing care.      Final Clinical Impression(s) / ED Diagnoses Final diagnoses:  Renal insufficiency  Return for intractable cough, coughing up blood, fevers > 100.4 unrelieved by medication, shortness of breath, intractable vomiting, chest pain, shortness of breath, weakness, numbness, changes in speech, facial asymmetry, abdominal pain, passing out, Inability to tolerate liquids or food, cough, altered mental status or any concerns. No signs of systemic illness or infection. The patient is nontoxic-appearing on exam and vital signs are within normal limits.  I have reviewed the triage vital signs and the nursing notes. Pertinent labs & imaging results that were available during my care of the patient were reviewed by me and considered in my medical decision making (see chart for details). After history, exam, and medical workup I feel the patient has been appropriately medically screened and is safe for discharge home. Pertinent diagnoses were  discussed with the patient. Patient was given return precautions.       Jakera Beaupre, MD 08/10/22 [phone]

## 2022-08-10 NOTE — ED Triage Notes (Signed)
Pt in via [facility], [place] - c/o L sided cp, "ongoing x 1 yr since his heart cath, but worse today". Also having diarrhea. Reports crack use yesterday  VS en route: 154/82 92 97% 18 CBG 193

## 2022-08-11 DIAGNOSIS — F332 Major depressive disorder, recurrent severe without psychotic features: Secondary | ICD-10-CM

## 2022-08-11 LAB — RESP PANEL BY RT-PCR (FLU A&B, COVID) ARPGX2
Influenza A by PCR: NEGATIVE
Influenza B by PCR: NEGATIVE
SARS Coronavirus 2 by RT PCR: NEGATIVE

## 2022-08-11 LAB — RAPID URINE DRUG SCREEN, HOSP PERFORMED
Amphetamines: NOT DETECTED
Barbiturates: NOT DETECTED
Benzodiazepines: NOT DETECTED
Cocaine: POSITIVE — AB
Opiates: NOT DETECTED
Tetrahydrocannabinol: NOT DETECTED

## 2022-08-11 LAB — CBG MONITORING, ED
Glucose-Capillary: 209 mg/dL — ABNORMAL HIGH (ref 70–99)
Glucose-Capillary: 90 mg/dL (ref 70–99)
Glucose-Capillary: 137 mg/dL — ABNORMAL HIGH (ref 70–99)

## 2022-08-11 MED ORDER — RILPIVIRINE HCL 25 MG PO TABS
25.0000 mg | ORAL_TABLET | Freq: Every day | ORAL | Status: DC
  Administered 2022-08-11 – 2022-08-13 (×2): 25 mg via ORAL
  Filled 2022-08-11 (×4): qty 1

## 2022-08-11 MED ORDER — DOLUTEGRAVIR SODIUM 50 MG PO TABS
50.0000 mg | ORAL_TABLET | Freq: Every day | ORAL | Status: DC
  Administered 2022-08-11 – 2022-08-13 (×2): 50 mg via ORAL
  Filled 2022-08-11 (×4): qty 1

## 2022-08-11 NOTE — ED Notes (Signed)
Patient given meal tray.

## 2022-08-11 NOTE — ED Notes (Signed)
Pt checked by security and pt belongings placed in secure locker

## 2022-08-11 NOTE — ED Notes (Signed)
Psych at bedside.

## 2022-08-11 NOTE — Progress Notes (Signed)
Per Lindon Romp, NP, patient meets criteria for inpatient treatment. There are no available  beds at [facility] today. CSW faxed referrals to the following facilities for review:  [place]  TTS will continue to seek bed placement.  Glennie Isle, MSW, [facility] Phone: [phone] Disposition/TOC

## 2022-08-11 NOTE — ED Notes (Signed)
Three labeled patient belongings bags secured at nurse's station.

## 2022-08-11 NOTE — Consult Note (Signed)
[place] ED ASSESSMENT   Reason for Consult:  SI Referring Physician:  Dene Gentry, MD Patient Identification: Michael [name] MRN:  [id number] ED Chief Complaint: MDD (major depressive disorder), recurrent severe, without psychosis ([place])  Diagnosis:  Principal Problem:   MDD (major depressive disorder), recurrent severe, without psychosis ([place]) Active Problems:   Cocaine use disorder [facility])   ED Assessment Time Calculation: Start Time: 1005 Stop Time: 1025 Total Time in Minutes (Assessment Completion): 20   Subjective:   Michael [name] is a 71 y.o. male patient admitted with a history of depression. His initial reason for seeking evaluation was chest pain and suicidal ideations, although he clarifies he has no specific plan to act on these thoughts.  HPI: The patient expresses concern regarding memory issues he has been experiencing since enduring two strokes approximately one year ago. He discloses that he did not undergo rehabilitation following these strokes. He mentions that he was hospitalized for an extended period of time at [facility] due to kidney issues. He acknowledges a 30-year history of on-and-off crack/cocaine use, with his longest period of sobriety lasting three years. The catalyst for his drug use was a work-related injury that initially led to opiate prescription. Upon discontinuation of these opiates, he turned to crack/cocaine as a coping mechanism. He states he last used crack/cocaine only three days prior to this visit. UDS is positive for Cocaine. He denies current use of other illicit substances, including marijuana, nicotine, and alcohol. The patient's mental health history is marked by suicidal ideations, and a previous suicide attempt approximately one year ago involving wrist cutting, which necessitated hospitalization. Regarding psychotropic medications, he struggles to recall specific names but mentions a prior regimen. Chart review reveals a history of Sertraline  '100mg'$ , Trazodone '100mg'$ , and Lexapro '10mg'$ . He reports previous experiences of auditory and visual hallucinations. He describes hearing voices mumbling, which instruct him to inflict harm upon himself. These hallucinations are accompanied by visual disturbances, where he perceives men in black attire with large hats. These symptoms are described as having occurred roughly three months ago, and he confesses to using crack/cocaine as a means to cope with the distress caused by these AVH, as well as with his suicidal and homicidal ideations. He asserts that he has no plan to act on these thoughts.However, he states that he is unable to contract for safety if discharged.  In regard to housing, he reveals he is homeless and temporarily resides in [place]. His sleep quality is poor, with an average of only two hours per night. His appetite remains good, although he mentions recent dental pain, which causes challenges with eating.  During the evaluation, the patient was alert and oriented x4. His physical appearance was casual and appropriate for the environment. He was cooperative in his behavior and communicated with speech at a normal rate and decreased volume. However, his mood was depressed, hopeless and worthless. His affect appeared congruent with his mood. The patient's thought process was coherent and linear and his thought content was logical. Denies current auditory and visual hallucinations this morning. No indication that patient is responding to internal stimuli. Endorses suicidal ideations with no specific plan. However, he is unable to contract for safety if discharged. He denies homicidal ideations.  Past Psychiatric History: MDD, cocaine abuse. He was psychiatrically admitted at [facility] 12/2020 and 08/2020.   Risk to Self or Others: Is the patient at risk to self? Yes Has the patient been a risk to self in the past 6 months? No  Has the patient been a risk to self within the distant past? Yes Is the  patient a risk to others? No Has the patient been a risk to others in the past 6 months? No Has the patient been a risk to others within the distant past? No  [place] Scale:  [place] ED from 08/10/2022 in [place] DEPT Most recent reading at 08/10/2022  3:08 PM ED from 08/10/2022 in [place] DEPT Most recent reading at 08/10/2022  3:25 AM ED from 07/12/2022 in [place] Most recent reading at 07/12/2022 12:09 PM  C-SSRS RISK CATEGORY High Risk Error: Q3, 4, or 5 should not be populated when Q2 is No High Risk       AIMS:  , , ,  ,   ASAM: ASAM Multidimensional Assessment Summary Dimension 1:  Description of individual's past and current experiences of substance use and withdrawal: Ongoing use DImension 1:  Acute Intoxication and/or Withdrawal Potential Severity Rating: Severe Dimension 2:  Description of patient's biomedical conditions and  complications: Recent chest pain Dimension 2:  Biomedical Conditions and Complications Severity Rating: Severe Dimension 3:  Description of emotional, behavioral, or cognitive conditions and complications: Depression Dimension 3:  Emotional, behavioral or cognitive (EBC) conditions and complications severity rating: Moderate Dimension 4:  Description of Readiness to Change criteria: Pt does not express a desire to quit using substances Dimension 4:  Readiness to Change Severity Rating: Severe Dimension 5:  Relapse, continued use, or continued problem potential critiera description: Pt does not express a desire to quit using substances Dimension 5:  Relapse, continued use, or continued problem potential severity rating: Severe Dimension 6:  Recovery/Iiving environment criteria description: Pt is homeless Dimension 6:  Recovery/living environment severity rating: Severe ASAM's Severity Rating Score: 14  Substance Abuse:  Alcohol / Drug Use History  of alcohol / drug use?: Yes Longest period of sobriety (when/how long): 3 years Negative Consequences of Use: Financial, Personal relationships Withdrawal Symptoms: None  Past Medical History:  Past Medical History:  Diagnosis Date   A-fib ([name])    Asthma    No PFTs, history of childhood asthma   CAD (coronary artery disease)    a. 06/2013 STEMI/PCI ([facility]): LAD w/ thrombus (treated with BMS), mid 75%, D2 75%; LCX OM2 75%; RCA small, PDA 95%, PLV 95%;  b. 10/2013 Cath/PCI: ISR w/in LAD (Promus DES x 2), borderline OM2 lesion;  c. 01/2014 MV: Intermediate risk, medium-sized distal ant wall infarct w/ very small amt of peri-infarct ischemia. EF 60%.   Cellulitis 04/2014   left facial   Cellulitis and abscess of toe of right foot 12/08/2019   Chondromalacia of medial femoral condyle    Left knee MRI 04/28/12: Chondromalacia of the medial femoral condyle with slight peripheral degeneration of the meniscocapsular junction of the medial meniscus; followed by sports medicine   Collagen vascular disease ([place])    Crack cocaine use    for 20+ years, has been enrolled in detox programs in the past   Depression    with history of hospitalization for suicidal ideation   Diabetes mellitus 2002   Diagnosed in 2002, started insulin in 2012   Gout    Gout 04/28/2012   Headache(784.0)    CT head 08/2011: Periventricular and subcortical white matter hypodensities are most in keeping with chronic microangiopathic change   HIV infection [facility]) Nov 2012   Followed by [name]   Hyperlipidemia    Hypertension  Pulmonary embolism [facility])     Past Surgical History:  Procedure Laterality Date   AMPUTATION Right 07/21/2019   Procedure: RIGHT SECOND TOE AMPUTATION;  Surgeon: [name], MD;  Location: [facility];  Service: Orthopedics;  Laterality: Right;   BACK SURGERY     1988   BOWEL RESECTION     CARDIAC SURGERY     CERVICAL SPINE SURGERY     " rods in my neck "   CORONARY ARTERY BYPASS GRAFT      CORONARY STENT PLACEMENT     NM MYOCAR PERF WALL MOTION  12/27/2011   normal   SPINE SURGERY     Family History:  Family History  Problem Relation Age of Onset   Diabetes Mother    Hypertension Mother    Hyperlipidemia Mother    Diabetes Father    Cancer Father    Hypertension Father    Diabetes Brother    Heart disease Brother    Diabetes Sister    Colon cancer Neg Hx     Social History:  Social History   Substance and Sexual Activity  Alcohol Use No   Alcohol/week: 4.0 standard drinks of alcohol   Types: 2 Cans of beer, 2 Shots of liquor per week     Social History   Substance and Sexual Activity  Drug Use Not Currently   Frequency: 4.0 times per week   Types: "Crack" cocaine, Cocaine   Comment: Recent use of crack    Social History   Socioeconomic History   Marital status: Widowed    Spouse name: Not on file   Number of children: 2   Years of education: 58   Highest education level: 12th grade  Occupational History    Employer: UNEMPLOYED    Comment: 04/2016  Tobacco Use   Smoking status: Never   Smokeless tobacco: Never  Vaping Use   Vaping Use: Never used  Substance and Sexual Activity   Alcohol use: No    Alcohol/week: 4.0 standard drinks of alcohol    Types: 2 Cans of beer, 2 Shots of liquor per week   Drug use: Not Currently    Frequency: 4.0 times per week    Types: "Crack" cocaine, Cocaine    Comment: Recent use of crack   Sexual activity: Yes    Comment: DECLINED CONDOMS  Other Topics Concern   Not on file  Social History Narrative   Currently staying with a friend in [place].  Was staying @ local motel until a few days ago - left b/c of bed bugs.   P[facility].  Not followed by a psychiatrist.   Social Determinants of Health   Financial Resource Strain: Medium Risk (09/16/2018)   Overall Financial Resource Strain (CARDIA)    Difficulty of Paying Living Expenses: Somewhat hard  Food Insecurity: Food Insecurity Present  (07/05/2022)   Hunger Vital Sign    Worried About Running Out of Food in the Last Year: Sometimes true    Ran Out of Food in the Last Year: Sometimes true  Transportation Needs: Unmet Transportation Needs (07/05/2022)   PRAPARE - Hydrologist (Medical): Yes    Lack of Transportation (Non-Medical): Yes  Physical Activity: Unknown (04/22/2019)   Exercise Vital Sign    Days of Exercise per Week: Not on file    Minutes of Exercise per Session: 0 min  Stress: Stress Concern Present (09/16/2018)   [place] -  Occupational Stress Questionnaire    Feeling of Stress : Very much  Social Connections: Socially Isolated (09/16/2018)   Social Connection and Isolation Panel [NHANES]    Frequency of Communication with Friends and Family: Once a week    Frequency of Social Gatherings with Friends and Family: Never    Attends Religious Services: Never    Marine scientist or Organizations: No    Attends Archivist Meetings: Never    Marital Status: Widowed   Additional Social History:    Allergies:  No Known Allergies  Labs:  Results for orders placed or performed during the hospital encounter of 08/10/22 (from the past 48 hour(s))  Resp Panel by RT-PCR (Flu A&B, Covid)     Status: None   Collection Time: 08/10/22  8:43 AM   Specimen: Nasal Swab  Result Value Ref Range   SARS Coronavirus 2 by RT PCR NEGATIVE NEGATIVE    Comment: (NOTE) SARS-CoV-2 target nucleic acids are NOT DETECTED.  The SARS-CoV-2 RNA is generally detectable in upper respiratory specimens during the acute phase of infection. The lowest concentration of SARS-CoV-2 viral copies this assay can detect is 138 copies/mL. A negative result does not preclude SARS-Cov-2 infection and should not be used as the sole basis for treatment or other patient management decisions. A negative result may occur with  improper specimen collection/handling, submission of  specimen other than nasopharyngeal swab, presence of viral mutation(s) within the areas targeted by this assay, and inadequate number of viral copies(<138 copies/mL). A negative result must be combined with clinical observations, patient history, and epidemiological information. The expected result is Negative.  Fact Sheet for Patients:  EntrepreneurPulse.com.au  Fact Sheet for Healthcare Providers:  IncredibleEmployment.be  This test is no t yet approved or cleared by the [place] FDA and  has been authorized for detection and/or diagnosis of SARS-CoV-2 by FDA under an Emergency Use Authorization (EUA). This EUA will remain  in effect (meaning this test can be used) for the duration of the COVID-19 declaration under Section 564(b)(1) of the Act, 21 U.S.C.section 360bbb-3(b)(1), unless the authorization is terminated  or revoked sooner.       Influenza A by PCR NEGATIVE NEGATIVE   Influenza B by PCR NEGATIVE NEGATIVE    Comment: (NOTE) The Xpert Xpress SARS-CoV-2/FLU/RSV plus assay is intended as an aid in the diagnosis of influenza from Nasopharyngeal swab specimens and should not be used as a sole basis for treatment. Nasal washings and aspirates are unacceptable for Xpert Xpress SARS-CoV-2/FLU/RSV testing.  Fact Sheet for Patients: EntrepreneurPulse.com.au  Fact Sheet for Healthcare Providers: IncredibleEmployment.be  This test is not yet approved or cleared by the [place] FDA and has been authorized for detection and/or diagnosis of SARS-CoV-2 by FDA under an Emergency Use Authorization (EUA). This EUA will remain in effect (meaning this test can be used) for the duration of the COVID-19 declaration under Section 564(b)(1) of the Act, 21 U.S.C. section 360bbb-3(b)(1), unless the authorization is terminated or revoked.  Performed at [facility], [place] [address] [place], [place] [address]   Urine rapid drug screen (hosp performed)     Status: Abnormal   Collection Time: 08/10/22  9:10 AM  Result Value Ref Range   Opiates NONE DETECTED NONE DETECTED   Cocaine POSITIVE (A) NONE DETECTED   Benzodiazepines NONE DETECTED NONE DETECTED   Amphetamines NONE DETECTED NONE DETECTED   Tetrahydrocannabinol NONE DETECTED NONE DETECTED   Barbiturates NONE DETECTED NONE DETECTED  Comment: (NOTE) DRUG SCREEN FOR MEDICAL PURPOSES ONLY.  IF CONFIRMATION IS NEEDED FOR ANY PURPOSE, NOTIFY LAB WITHIN 5 DAYS.  LOWEST DETECTABLE LIMITS FOR URINE DRUG SCREEN Drug Class                     Cutoff (ng/mL) Amphetamine and metabolites    1000 Barbiturate and metabolites    200 Benzodiazepine                 200 Opiates and metabolites        300 Cocaine and metabolites        300 THC                            50 Performed at [facility], [place] [address] [place], [place] [address]   Troponin I (High Sensitivity)     Status: None   Collection Time: 08/10/22  7:50 PM  Result Value Ref Range   Troponin I (High Sensitivity) 13 <18 ng/L    Comment: (NOTE) Elevated high sensitivity troponin I (hsTnI) values and significant  changes across serial measurements may suggest ACS but many other  chronic and acute conditions are known to elevate hsTnI results.  Refer to the "Links" section for chest pain algorithms and additional  guidance. Performed at [facility], [place] [address], [place], [place] [address]   CBC with Differential     Status: Abnormal   Collection Time: 08/10/22  7:50 PM  Result Value Ref Range   WBC 3.0 (L) 4.0 - 10.5 K/uL   RBC 3.35 (L) 4.22 - 5.81 MIL/uL   Hemoglobin 9.4 (L) 13.0 - 17.0 g/dL   HCT 29.3 (L) 39.0 - 52.0 %   MCV 87.5 80.0 - 100.0 fL   MCH 28.1 26.0 - 34.0 pg   MCHC 32.1 30.0 - 36.0 g/dL   RDW 13.1 11.5 - 15.5 %   Platelets 148 (L) 150 - 400 K/uL   nRBC 0.0 0.0 - 0.2 %   Neutrophils  Relative % 53 %   Neutro Abs 1.6 (L) 1.7 - 7.7 K/uL   Lymphocytes Relative 33 %   Lymphs Abs 1.0 0.7 - 4.0 K/uL   Monocytes Relative 9 %   Monocytes Absolute 0.3 0.1 - 1.0 K/uL   Eosinophils Relative 4 %   Eosinophils Absolute 0.1 0.0 - 0.5 K/uL   Basophils Relative 1 %   Basophils Absolute 0.0 0.0 - 0.1 K/uL   Immature Granulocytes 0 %   Abs Immature Granulocytes 0.01 0.00 - 0.07 K/uL    Comment: Performed at [facility], [place] [address], [place], [place] [address]  Ethanol     Status: None   Collection Time: 08/10/22  7:50 PM  Result Value Ref Range   Alcohol, Ethyl (B) <10 <10 mg/dL    Comment: (NOTE) Lowest detectable limit for serum alcohol is 10 mg/dL.  For medical purposes only. Performed at [facility], [place] [address] [place], [place] [address]   Comprehensive metabolic panel     Status: Abnormal   Collection Time: 08/10/22  7:50 PM  Result Value Ref Range   Sodium 139 135 - 145 mmol/L   Potassium 4.3 3.5 - 5.1 mmol/L   Chloride 110 98 - 111 mmol/L   CO2 23 22 - 32 mmol/L   Glucose, Bld 145 (H) 70 - 99 mg/dL    Comment: Glucose reference range  applies only to samples taken after fasting for at least 8 hours.   BUN 45 (H) 8 - 23 mg/dL   Creatinine, Ser 3.66 (H) 0.61 - 1.24 mg/dL   Calcium 8.3 (L) 8.9 - 10.3 mg/dL   Total Protein 6.7 6.5 - 8.1 g/dL   Albumin 3.6 3.5 - 5.0 g/dL   AST 14 (L) 15 - 41 U/L   ALT 12 0 - 44 U/L   Alkaline Phosphatase 70 38 - 126 U/L   Total Bilirubin 0.6 0.3 - 1.2 mg/dL   GFR, Estimated 17 (L) >60 mL/min    Comment: (NOTE) Calculated using the CKD-EPI Creatinine Equation (2021)    Anion gap 6 5 - 15    Comment: Performed at [facility], [place] [address], [place], [place] [address]  CBG monitoring, ED     Status: Abnormal   Collection Time: 08/11/22  9:11 AM  Result Value Ref Range   Glucose-Capillary 209 (H) 70 - 99 mg/dL    Comment: Glucose reference range applies only to  samples taken after fasting for at least 8 hours.    Current Facility-Administered Medications  Medication Dose Route Frequency Provider Last Rate Last Admin   albuterol (PROVENTIL) (2.5 MG/3ML) 0.083% nebulizer solution 2.5 mg  2.5 mg Nebulization Q6H PRN Valarie Merino, MD       apixaban Arne Cleveland) tablet 5 mg  5 mg Oral BID Valarie Merino, MD   5 mg at 08/11/22 1025   aspirin EC tablet 81 mg  81 mg Oral Daily Valarie Merino, MD   81 mg at 08/11/22 1025   atorvastatin (LIPITOR) tablet 40 mg  40 mg Oral Daily Valarie Merino, MD   40 mg at 08/11/22 1025   clopidogrel (PLAVIX) tablet 75 mg  75 mg Oral Daily Valarie Merino, MD   75 mg at 08/11/22 1025   diltiazem (CARDIZEM CD) 24 hr capsule 120 mg  120 mg Oral BID Valarie Merino, MD   120 mg at 08/11/22 1025   rilpivirine (EDURANT) tablet 25 mg  25 mg Oral QAC lunch Valarie Merino, MD       And   dolutegravir (TIVICAY) tablet 50 mg  50 mg Oral QAC lunch Valarie Merino, MD       FLUoxetine (PROZAC) capsule 20 mg  20 mg Oral Daily Valarie Merino, MD   20 mg at 08/11/22 1025   gabapentin (NEURONTIN) capsule 300 mg  300 mg Oral QHS Valarie Merino, MD       insulin aspart (novoLOG) injection 4 Units  4 Units Subcutaneous TID WC Valarie Merino, MD   4 Units at 08/11/22 0915   insulin glargine-yfgn (SEMGLEE) injection 6 Units  6 Units Subcutaneous Daily Valarie Merino, MD   6 Units at 08/11/22 1026   nitroGLYCERIN (NITROSTAT) SL tablet 0.4 mg  0.4 mg Sublingual Q5 min PRN Valarie Merino, MD       pantoprazole (PROTONIX) EC tablet 40 mg  40 mg Oral Daily PRN Valarie Merino, MD       sevelamer carbonate (RENVELA) tablet 800 mg  800 mg Oral TID WC Valarie Merino, MD   800 mg at 08/11/22 0916   traZODone (DESYREL) tablet 100 mg  100 mg Oral QHS Valarie Merino, MD       umeclidinium bromide (INCRUSE ELLIPTA) 62.5 MCG/ACT 1 puff  1 puff Inhalation Daily Valarie Merino, MD  Current Outpatient Medications  Medication Sig  Dispense Refill   albuterol (VENTOLIN HFA) 108 (90 Base) MCG/ACT inhaler Inhale 1-2 puffs into the lungs every 6 (six) hours as needed for wheezing or shortness of breath. 8.5 g 1   amLODipine (NORVASC) 10 MG tablet Take 1 tablet (10 mg total) by mouth daily. 30 tablet 11   apixaban (ELIQUIS) 5 MG TABS tablet Take 1 tablet (5 mg total) by mouth 2 (two) times daily. 60 tablet 0   aspirin EC 81 MG tablet Take 81 mg by mouth daily. Swallow whole.     atorvastatin (LIPITOR) 40 MG tablet Take 40 mg by mouth daily.     clopidogrel (PLAVIX) 75 MG tablet Take 1 tablet (75 mg total) by mouth daily. 60 tablet 0   diltiazem (CARDIZEM CD) 120 MG 24 hr capsule Take 120 mg by mouth 2 (two) times daily.     dolutegravir-rilpivirine (JULUCA) 50-25 MG tablet Take 1 tablet by mouth daily before lunch. 30 tablet 0   FLUoxetine (PROZAC) 20 MG capsule Take 20 mg by mouth daily.     gabapentin (NEURONTIN) 300 MG capsule Take 1 capsule (300 mg total) by mouth at bedtime. 60 capsule 0   INCRUSE ELLIPTA 62.5 MCG/ACT AEPB Inhale 1 puff into the lungs daily.     insulin glargine (LANTUS SOLOSTAR) 100 UNIT/ML Solostar Pen Inject 6 Units into the skin daily. 6 mL 3   insulin lispro (HUMALOG KWIKPEN) 100 UNIT/ML KwikPen Inject 4 Units into the skin 3 (three) times daily with meals. 15 mL 0   nitroGLYCERIN (NITROSTAT) 0.4 MG SL tablet Place 1 tablet (0.4 mg total) under the tongue every 5 (five) minutes as needed for chest pain. 25 tablet 0   pantoprazole (PROTONIX) 40 MG tablet Take 1 tablet (40 mg total) by mouth daily as needed (for heartburn). 30 tablet 2   Semaglutide,0.25 or 0.'5MG'$ /DOS, 2 MG/3ML SOPN Inject 0.5 mg into the skin once a week. 3 mL 0   sevelamer carbonate (RENVELA) 800 MG tablet Take 800 mg by mouth 3 (three) times daily.     traZODone (DESYREL) 100 MG tablet Take 100 mg by mouth at bedtime.     budesonide-formoterol (SYMBICORT) 160-4.5 MCG/ACT inhaler Inhale 2 puffs into the lungs in the morning and at  bedtime. (Patient not taking: Reported on 07/02/2022) 10.2 g 2   buPROPion (WELLBUTRIN XL) 150 MG 24 hr tablet Take 1 tablet (150 mg total) by mouth every morning. (Patient not taking: Reported on 07/02/2022) 30 tablet 3   Insulin Pen Needle 31G X 5 MM MISC Use with insulin pens (Patient taking differently: 1 each by Other route See admin instructions. Use with insulin pens) 100 each 1    Musculoskeletal: Strength & Muscle Tone: within normal limits Gait & Station: not observed Patient leans: N/A   Psychiatric Specialty Exam: Presentation  General Appearance:  Casual  Eye Contact: Good  Speech: Clear and Coherent; Normal Rate  Speech Volume: Decreased  Handedness: Right   Mood and Affect  Mood: Depressed; Hopeless; Worthless  Affect: Congruent; Depressed   Thought Process  Thought Processes: Coherent  Descriptions of Associations:Intact  Orientation:Full (Time, Place and Person)  Thought Content:Logical  History of Schizophrenia/Schizoaffective disorder:No  Duration of Psychotic Symptoms:N/A  Hallucinations:Hallucinations: None  Ideas of Reference:None  Suicidal Thoughts:Suicidal Thoughts: Yes, Active  Homicidal Thoughts:Homicidal Thoughts: No   Sensorium  Memory: Immediate Good; Recent Good; Remote Good  Judgment: Impaired  Insight: Present   Executive Functions  Concentration: Good  Attention Span: Good  Recall: Good  Fund of Knowledge: Fair  Language: Fair   Psychomotor Activity  Psychomotor Activity: Psychomotor Activity: Normal   Assets  Assets: Armed forces logistics/support/administrative officer; Desire for Improvement; Financial Resources/Insurance    Sleep  Sleep: Sleep: Poor   Physical Exam: Physical Exam Vitals and nursing note reviewed.  Constitutional:      General: He is not in acute distress.    Appearance: He is not ill-appearing, toxic-appearing or diaphoretic.  Neck:     Thyroid: No thyromegaly.  Cardiovascular:     Rate  and Rhythm: Normal rate.  Pulmonary:     Effort: Pulmonary effort is normal. No tachypnea.  Musculoskeletal:        General: Normal range of motion.     Cervical back: Normal range of motion.  Neurological:     General: No focal deficit present.     Mental Status: He is alert.  Psychiatric:        Mood and Affect: Mood is depressed.        Thought Content: Thought content is not paranoid or delusional. Thought content includes suicidal ideation. Thought content does not include homicidal ideation.    Review of Systems  Respiratory:  Negative for cough and shortness of breath.   Cardiovascular:  Negative for chest pain.  Gastrointestinal:  Negative for diarrhea, nausea and vomiting.  Psychiatric/Behavioral:  Positive for depression, substance abuse and suicidal ideas. Negative for hallucinations. The patient is nervous/anxious and has insomnia.     Blood pressure (!) 188/94, pulse 70, temperature 97.8 F (36.6 C), temperature source Oral, resp. rate 16, height '5\' 8"'$  (1.727 m), weight 72.6 kg, SpO2 100 %. Body mass index is 24.33 kg/m.  Medical Decision Making: Michael [name] is a 71 y.o. male patient admitted with a history of depression. His initial reason for seeking evaluation was chest pain and suicidal ideations. He denies a specific suicide plan, but he states that he is unable to contract for safety outside of the hospital.   Problem 1: MDD  Problem 2: Cocaine Use Disorder   Disposition: Recommend psychiatric Inpatient admission when medically cleared.  Rozetta Nunnery, NP 08/11/2022 10:34 AM

## 2022-08-12 LAB — CBG MONITORING, ED: Glucose-Capillary: 149 mg/dL — ABNORMAL HIGH (ref 70–99)

## 2022-08-12 MED ORDER — FLUOXETINE HCL 20 MG PO CAPS
30.0000 mg | ORAL_CAPSULE | Freq: Every day | ORAL | Status: DC
  Administered 2022-08-13: 30 mg via ORAL
  Filled 2022-08-12: qty 1

## 2022-08-12 NOTE — ED Provider Notes (Signed)
Emergency Medicine Observation Re-evaluation Note  Michael [name] is a 71 y.o. male, seen on rounds today.  Pt initially presented to the ED for complaints of Suicidal and Shortness of Breath Currently, the patient is sleeping.  Physical Exam  BP 131/74 (BP Location: Left Leg)   Pulse 60   Temp 98.6 F (37 C) (Oral)   Resp 16   Ht '5\' 8"'$  (1.727 m)   Wt 72.6 kg   SpO2 100%   BMI 24.33 kg/m  Physical Exam General: no distress   ED Course / MDM  EKG:EKG Interpretation  Date/Time:  Saturday August 10 2022 15:57:43 EST Ventricular Rate:  73 PR Interval:  141 QRS Duration: 96 QT Interval:  413 QTC Calculation: 456 R Axis:   80 Text Interpretation: Sinus rhythm Atrial premature complex Confirmed by [place] ([id number]) on 08/11/2022 11:44:44 AM  I have reviewed the labs performed to date as well as medications administered while in observation.  Recent changes in the last 24 hours include awaiting placement.  Plan  Current plan is for awaiting placement.    [name], MD 08/12/22 [phone]

## 2022-08-12 NOTE — Progress Notes (Signed)
Inpatient Behavioral Health Placement  Pt meets inpatient criteria per Delfin Gant, NP. There are no available beds at [facility] per [facility] Lynnda Shields, RN.  Referral was sent to the following facilities;   Destination  Service Provider Address Phone Fax  [facility]  [address], [place]  [address] [phone]  [facility]  [place], [place] [address] [phone]  [facility]  [address], [place] [address] [phone]  [facility]  [address] [place], [place] [address] [phone] [facility]  [address], [place] [address] [phone] [place]  [address] [place] [address] [phone] [facility]  [address], [place] [address] [phone] [place]  [address] [place] [address] [phone] [facility]  [place], [place] [address] [phone] [place], [place] [address] [phone]  [facility]  [address] [place] [address] [phone] [facility]  [place], [place] [address] [phone]  [facility]  [place] [address] [place] [address] [phone]  [facility]  [address] [place] [address] [phone]  [place] [address], [place] [address] [phone]  [facility]  [address] [place] [address] [phone] [facility]  [address] [place]  [address] [phone] [facility]  [address] [place]  [address] [phone]  [facility]  [place], [place] [address] [place]  [facility]  [address] [place] [address] [phone] [facility]  [address] [place] [address] [place] [address], [place] [address] [phone]    Situation ongoing,  CSW will follow up.   Benjaman Kindler, MSW, LCSWA 08/12/2022  @ 6:33 PM

## 2022-08-12 NOTE — Progress Notes (Signed)
[facility] Psych ED Progress Note  08/12/2022 4:29 PM Carlo Guevarra  MRN:  [id number]   Subjective:  Michael [name] is a 71 y.o. male patient admitted with a history of depression. His initial reason for seeking evaluation was chest pain and suicidal ideations, although he clarifies he has no specific plan to act on these thoughts.  Patient was seen awake alert and oriented x 5.  Patient remains suicidal, depressed, worthless and hopeless.  He is not contracting for safety at this time.  He has two adult children that have nothing to do with him.  Patient is currently taking Prozac 20 mg and same is increased to 30 mg starting tomorrow.  We continue to seek bed placement at any hospital with available bed.  He denies HI/AVH and no mention of paranoia. Principal Problem: MDD (major depressive disorder), recurrent severe, without psychosis ([place]) Diagnosis:  Principal Problem:   MDD (major depressive disorder), recurrent severe, without psychosis ([place]) Active Problems:   Cocaine use disorder [facility])   ED Assessment Time Calculation: Start Time: 1546 Stop Time: 1600 Total Time in Minutes (Assessment Completion): 14   Past Psychiatric History: see initial Psychiatric evaluation note  [place]:  [place] ED from 08/10/2022 in [facility] DEPT Most recent reading at 08/10/2022  3:08 PM ED from 08/10/2022 in [place] DEPT Most recent reading at 08/10/2022  3:25 AM ED from 07/12/2022 in [place] Most recent reading at 07/12/2022 12:09 PM  C-SSRS RISK CATEGORY High Risk Error: Q3, 4, or 5 should not be populated when Q2 is No High Risk       Past Medical History:  Past Medical History:  Diagnosis Date   A-fib ([place])    Asthma    No PFTs, history of childhood asthma   CAD (coronary artery disease)    a. 06/2013 STEMI/PCI ([facility]): LAD w/ thrombus (treated with BMS), mid 75%, D2 75%; LCX  OM2 75%; RCA small, PDA 95%, PLV 95%;  b. 10/2013 Cath/PCI: ISR w/in LAD (Promus DES x 2), borderline OM2 lesion;  c. 01/2014 MV: Intermediate risk, medium-sized distal ant wall infarct w/ very small amt of peri-infarct ischemia. EF 60%.   Cellulitis 04/2014   left facial   Cellulitis and abscess of toe of right foot 12/08/2019   Chondromalacia of medial femoral condyle    Left knee MRI 04/28/12: Chondromalacia of the medial femoral condyle with slight peripheral degeneration of the meniscocapsular junction of the medial meniscus; followed by sports medicine   Collagen vascular disease ([place])    Crack cocaine use    for 20+ years, has been enrolled in detox programs in the past   Depression    with history of hospitalization for suicidal ideation   Diabetes mellitus 2002   Diagnosed in 2002, started insulin in 2012   Gout    Gout 04/28/2012   Headache(784.0)    CT head 08/2011: Periventricular and subcortical white matter hypodensities are most in keeping with chronic microangiopathic change   HIV infection [facility]) Nov 2012   Followed by [name]   Hyperlipidemia    Hypertension    Pulmonary embolism [facility])     Past Surgical History:  Procedure Laterality Date   AMPUTATION Right 07/21/2019   Procedure: RIGHT SECOND TOE AMPUTATION;  Surgeon: [name], MD;  Location: [place];  Service: Orthopedics;  Laterality: Right;   BACK SURGERY     1988   BOWEL RESECTION  CARDIAC SURGERY     CERVICAL SPINE SURGERY     " rods in my neck "   CORONARY ARTERY BYPASS GRAFT     CORONARY STENT PLACEMENT     NM MYOCAR PERF WALL MOTION  12/27/2011   normal   SPINE SURGERY     Family History:  Family History  Problem Relation Age of Onset   Diabetes Mother    Hypertension Mother    Hyperlipidemia Mother    Diabetes Father    Cancer Father    Hypertension Father    Diabetes Brother    Heart disease Brother    Diabetes Sister    Colon cancer Neg Hx    Family Psychiatric  History: see  initial Psychiatric note Social History:  Social History   Substance and Sexual Activity  Alcohol Use No   Alcohol/week: 4.0 standard drinks of alcohol   Types: 2 Cans of beer, 2 Shots of liquor per week     Social History   Substance and Sexual Activity  Drug Use Not Currently   Frequency: 4.0 times per week   Types: "Crack" cocaine, Cocaine   Comment: Recent use of crack    Social History   Socioeconomic History   Marital status: Widowed    Spouse name: Not on file   Number of children: 2   Years of education: 29   Highest education level: 12th grade  Occupational History    Employer: UNEMPLOYED    Comment: 04/2016  Tobacco Use   Smoking status: Never   Smokeless tobacco: Never  Vaping Use   Vaping Use: Never used  Substance and Sexual Activity   Alcohol use: No    Alcohol/week: 4.0 standard drinks of alcohol    Types: 2 Cans of beer, 2 Shots of liquor per week   Drug use: Not Currently    Frequency: 4.0 times per week    Types: "Crack" cocaine, Cocaine    Comment: Recent use of crack   Sexual activity: Yes    Comment: DECLINED CONDOMS  Other Topics Concern   Not on file  Social History Narrative   Currently staying with a friend in [place].  Was staying @ local motel until a few days ago - left b/c of bed bugs.   Picked up from Extended [facility].  Not followed by a psychiatrist.   Social Determinants of Health   Financial Resource Strain: Medium Risk (09/16/2018)   Overall Financial Resource Strain (CARDIA)    Difficulty of Paying Living Expenses: Somewhat hard  Food Insecurity: Food Insecurity Present (07/05/2022)   Hunger Vital Sign    Worried About Running Out of Food in the Last Year: Sometimes true    Ran Out of Food in the Last Year: Sometimes true  Transportation Needs: Unmet Transportation Needs (07/05/2022)   PRAPARE - Hydrologist (Medical): Yes    Lack of Transportation (Non-Medical): Yes  Physical Activity: Unknown  (04/22/2019)   Exercise Vital Sign    Days of Exercise per Week: Not on file    Minutes of Exercise per Session: 0 min  Stress: Stress Concern Present (09/16/2018)   [place]    Feeling of Stress : Very much  Social Connections: Socially Isolated (09/16/2018)   Social Connection and Isolation Panel [NHANES]    Frequency of Communication with Friends and Family: Once a week    Frequency of Social Gatherings with Friends and Family: Never  Attends Religious Services: Never    Active Member of Clubs or Organizations: No    Attends Archivist Meetings: Never    Marital Status: Widowed    Sleep: Fair  Appetite:  Poor  Current Medications: Current Facility-Administered Medications  Medication Dose Route Frequency Provider Last Rate Last Admin   albuterol (PROVENTIL) (2.5 MG/3ML) 0.083% nebulizer solution 2.5 mg  2.5 mg Nebulization Q6H PRN Valarie Merino, MD       apixaban [place]) tablet 5 mg  5 mg Oral BID Valarie Merino, MD   5 mg at 08/12/22 1004   aspirin EC tablet 81 mg  81 mg Oral Daily Valarie Merino, MD   81 mg at 08/12/22 1004   atorvastatin (LIPITOR) tablet 40 mg  40 mg Oral Daily Valarie Merino, MD   40 mg at 08/12/22 1004   clopidogrel (PLAVIX) tablet 75 mg  75 mg Oral Daily Valarie Merino, MD   75 mg at 08/12/22 1004   diltiazem (CARDIZEM CD) 24 hr capsule 120 mg  120 mg Oral BID Valarie Merino, MD   120 mg at 08/12/22 1005   rilpivirine (EDURANT) tablet 25 mg  25 mg Oral QAC lunch Valarie Merino, MD   25 mg at 08/11/22 1317   And   dolutegravir (TIVICAY) tablet 50 mg  50 mg Oral QAC lunch Valarie Merino, MD   50 mg at 08/11/22 1318   [START ON 08/13/2022] FLUoxetine (PROZAC) capsule 30 mg  30 mg Oral Daily Reida Hem C, NP       insulin aspart (novoLOG) injection 4 Units  4 Units Subcutaneous TID WC Valarie Merino, MD   4 Units at 08/12/22 1322   insulin glargine-yfgn  (SEMGLEE) injection 6 Units  6 Units Subcutaneous Daily Valarie Merino, MD   6 Units at 08/12/22 1005   nitroGLYCERIN (NITROSTAT) SL tablet 0.4 mg  0.4 mg Sublingual Q5 min PRN Valarie Merino, MD       pantoprazole (PROTONIX) EC tablet 40 mg  40 mg Oral Daily PRN Valarie Merino, MD       sevelamer carbonate (RENVELA) tablet 800 mg  800 mg Oral TID WC Valarie Merino, MD   800 mg at 08/12/22 1006   umeclidinium bromide (INCRUSE ELLIPTA) 62.5 MCG/ACT 1 puff  1 puff Inhalation Daily Valarie Merino, MD   1 puff at 08/12/22 1008   Current Outpatient Medications  Medication Sig Dispense Refill   albuterol (VENTOLIN HFA) 108 (90 Base) MCG/ACT inhaler Inhale 1-2 puffs into the lungs every 6 (six) hours as needed for wheezing or shortness of breath. 8.5 g 1   amLODipine (NORVASC) 10 MG tablet Take 1 tablet (10 mg total) by mouth daily. 30 tablet 11   apixaban (ELIQUIS) 5 MG TABS tablet Take 1 tablet (5 mg total) by mouth 2 (two) times daily. 60 tablet 0   aspirin EC 81 MG tablet Take 81 mg by mouth daily. Swallow whole.     atorvastatin (LIPITOR) 40 MG tablet Take 40 mg by mouth daily.     clopidogrel (PLAVIX) 75 MG tablet Take 1 tablet (75 mg total) by mouth daily. 60 tablet 0   diltiazem (CARDIZEM CD) 120 MG 24 hr capsule Take 120 mg by mouth 2 (two) times daily.     dolutegravir-rilpivirine (JULUCA) 50-25 MG tablet Take 1 tablet by mouth daily before lunch. 30 tablet 0   FLUoxetine (PROZAC) 20 MG capsule Take 20 mg  by mouth daily.     gabapentin (NEURONTIN) 300 MG capsule Take 1 capsule (300 mg total) by mouth at bedtime. 60 capsule 0   INCRUSE ELLIPTA 62.5 MCG/ACT AEPB Inhale 1 puff into the lungs daily.     insulin glargine (LANTUS SOLOSTAR) 100 UNIT/ML Solostar Pen Inject 6 Units into the skin daily. 6 mL 3   insulin lispro (HUMALOG KWIKPEN) 100 UNIT/ML KwikPen Inject 4 Units into the skin 3 (three) times daily with meals. 15 mL 0   nitroGLYCERIN (NITROSTAT) 0.4 MG SL tablet Place 1 tablet  (0.4 mg total) under the tongue every 5 (five) minutes as needed for chest pain. 25 tablet 0   pantoprazole (PROTONIX) 40 MG tablet Take 1 tablet (40 mg total) by mouth daily as needed (for heartburn). 30 tablet 2   Semaglutide,0.25 or 0.'5MG'$ /DOS, 2 MG/3ML SOPN Inject 0.5 mg into the skin once a week. 3 mL 0   sevelamer carbonate (RENVELA) 800 MG tablet Take 800 mg by mouth 3 (three) times daily.     traZODone (DESYREL) 100 MG tablet Take 100 mg by mouth at bedtime.     budesonide-formoterol (SYMBICORT) 160-4.5 MCG/ACT inhaler Inhale 2 puffs into the lungs in the morning and at bedtime. (Patient not taking: Reported on 07/02/2022) 10.2 g 2   buPROPion (WELLBUTRIN XL) 150 MG 24 hr tablet Take 1 tablet (150 mg total) by mouth every morning. (Patient not taking: Reported on 07/02/2022) 30 tablet 3   Insulin Pen Needle 31G X 5 MM MISC Use with insulin pens (Patient taking differently: 1 each by Other route See admin instructions. Use with insulin pens) 100 each 1    Lab Results:  Results for orders placed or performed during the hospital encounter of 08/10/22 (from the past 48 hour(s))  Troponin I (High Sensitivity)     Status: None   Collection Time: 08/10/22  7:50 PM  Result Value Ref Range   Troponin I (High Sensitivity) 13 <18 ng/L    Comment: (NOTE) Elevated high sensitivity troponin I (hsTnI) values and significant  changes across serial measurements may suggest ACS but many other  chronic and acute conditions are known to elevate hsTnI results.  Refer to the "Links" section for chest pain algorithms and additional  guidance. Performed at [facility], [place] [address], [place], [place] [address]   CBC with Differential     Status: Abnormal   Collection Time: 08/10/22  7:50 PM  Result Value Ref Range   WBC 3.0 (L) 4.0 - 10.5 K/uL   RBC 3.35 (L) 4.22 - 5.81 MIL/uL   Hemoglobin 9.4 (L) 13.0 - 17.0 g/dL   HCT 29.3 (L) 39.0 - 52.0 %   MCV 87.5 80.0 - 100.0 fL   MCH 28.1  26.0 - 34.0 pg   MCHC 32.1 30.0 - 36.0 g/dL   RDW 13.1 11.5 - 15.5 %   Platelets 148 (L) 150 - 400 K/uL   nRBC 0.0 0.0 - 0.2 %   Neutrophils Relative % 53 %   Neutro Abs 1.6 (L) 1.7 - 7.7 K/uL   Lymphocytes Relative 33 %   Lymphs Abs 1.0 0.7 - 4.0 K/uL   Monocytes Relative 9 %   Monocytes Absolute 0.3 0.1 - 1.0 K/uL   Eosinophils Relative 4 %   Eosinophils Absolute 0.1 0.0 - 0.5 K/uL   Basophils Relative 1 %   Basophils Absolute 0.0 0.0 - 0.1 K/uL   Immature Granulocytes 0 %   Abs Immature Granulocytes 0.01  0.00 - 0.07 K/uL    Comment: Performed at [facility], [place] [address], [place], [place] [address]  Ethanol     Status: None   Collection Time: 08/10/22  7:50 PM  Result Value Ref Range   Alcohol, Ethyl (B) <10 <10 mg/dL    Comment: (NOTE) Lowest detectable limit for serum alcohol is 10 mg/dL.  For medical purposes only. Performed at [facility], [place] [address] [place], [place] [address]   Comprehensive metabolic panel     Status: Abnormal   Collection Time: 08/10/22  7:50 PM  Result Value Ref Range   Sodium 139 135 - 145 mmol/L   Potassium 4.3 3.5 - 5.1 mmol/L   Chloride 110 98 - 111 mmol/L   CO2 23 22 - 32 mmol/L   Glucose, Bld 145 (H) 70 - 99 mg/dL    Comment: Glucose reference range applies only to samples taken after fasting for at least 8 hours.   BUN 45 (H) 8 - 23 mg/dL   Creatinine, Ser 3.66 (H) 0.61 - 1.24 mg/dL   Calcium 8.3 (L) 8.9 - 10.3 mg/dL   Total Protein 6.7 6.5 - 8.1 g/dL   Albumin 3.6 3.5 - 5.0 g/dL   AST 14 (L) 15 - 41 U/L   ALT 12 0 - 44 U/L   Alkaline Phosphatase 70 38 - 126 U/L   Total Bilirubin 0.6 0.3 - 1.2 mg/dL   GFR, Estimated 17 (L) >60 mL/min    Comment: (NOTE) Calculated using the CKD-EPI Creatinine Equation (2021)    Anion gap 6 5 - 15    Comment: Performed at [facility], [place] [address] [place], [place] [address]  CBG monitoring, ED     Status: Abnormal   Collection  Time: 08/11/22  9:11 AM  Result Value Ref Range   Glucose-Capillary 209 (H) 70 - 99 mg/dL    Comment: Glucose reference range applies only to samples taken after fasting for at least 8 hours.  CBG monitoring, ED     Status: None   Collection Time: 08/11/22  1:13 PM  Result Value Ref Range   Glucose-Capillary 90 70 - 99 mg/dL    Comment: Glucose reference range applies only to samples taken after fasting for at least 8 hours.  CBG monitoring, ED     Status: Abnormal   Collection Time: 08/11/22  7:31 PM  Result Value Ref Range   Glucose-Capillary 137 (H) 70 - 99 mg/dL    Comment: Glucose reference range applies only to samples taken after fasting for at least 8 hours.  CBG monitoring, ED     Status: Abnormal   Collection Time: 08/12/22 10:00 AM  Result Value Ref Range   Glucose-Capillary 149 (H) 70 - 99 mg/dL    Comment: Glucose reference range applies only to samples taken after fasting for at least 8 hours.    Blood Alcohol level:  Lab Results  Component Value Date   ETH <10 08/10/2022   ETH <10 11/20/2021    Physical Findings:  CIWA:    COWS:     Musculoskeletal: Strength & Muscle Tone:  seen lying down in bed Gait & Station:  seen in bed Patient leans:  see above.  Psychiatric Specialty Exam:  Presentation  General Appearance:  Casual  Eye Contact: Good  Speech: Clear and Coherent; Normal Rate  Speech Volume: Normal  Handedness: Right   Mood and Affect  Mood: Depressed; Hopeless; Worthless  Affect: Congruent; Depressed  Thought Process  Thought Processes: Coherent  Descriptions of Associations:Intact  Orientation:Full (Time, Place and Person)  Thought Content:Logical  History of Schizophrenia/Schizoaffective disorder:No  Duration of Psychotic Symptoms:N/A  Hallucinations:Hallucinations: None  Ideas of Reference:None  Suicidal Thoughts:Suicidal Thoughts: Yes, Active SI Active Intent and/or Plan: With Intent; With Plan  Homicidal  Thoughts:Homicidal Thoughts: No   Sensorium  Memory: Immediate Good; Recent Good; Remote Good  Judgment: Impaired  Insight: Present   Executive Functions  Concentration: Good  Attention Span: Good  Recall: Good  Fund of Knowledge: Good  Language: Fair   Psychomotor Activity  Psychomotor Activity: Psychomotor Activity: Normal   Assets  Assets: Communication Skills; Desire for Improvement; Financial Resources/Insurance   Sleep  Sleep: Sleep: Fair    Physical Exam: Physical Exam Vitals and nursing note reviewed.  Constitutional:      Appearance: He is well-developed.  HENT:     Head: Normocephalic and atraumatic.     Nose: Nose normal.  Cardiovascular:     Rate and Rhythm: Normal rate.  Pulmonary:     Effort: Pulmonary effort is normal.  Musculoskeletal:        General: Normal range of motion.     Cervical back: Normal range of motion.  Skin:    General: Skin is warm and dry.  Neurological:     Mental Status: He is alert and oriented to person, place, and time.    Review of Systems  Constitutional: Negative.   HENT: Negative.    Eyes: Negative.   Respiratory: Negative.    Cardiovascular: Negative.   Gastrointestinal: Negative.   Genitourinary: Negative.   Musculoskeletal: Negative.   Skin: Negative.   Neurological: Negative.   Endo/Heme/Allergies: Negative.   Psychiatric/Behavioral:  Positive for depression, substance abuse and suicidal ideas. The patient is nervous/anxious and has insomnia.    Blood pressure 128/69, pulse 63, temperature 98.5 F (36.9 C), temperature source Oral, resp. rate 16, height '5\' 8"'$  (1.727 m), weight 72.6 kg, SpO2 100 %. Body mass index is 24.33 kg/m.   Medical Decision Making: Patient states he is unable to contract for safety outside the hospital without care.  He remains hopeless and worthless.  He is taking Prozac 20 mg daily but same is increased to 30 mg daily from tomorrow.  We will continue to seek  bed placement.  Problem 1: Major Depressive disorder, severe without Psychotic features Problem 2: Cocaine use disorder Seek inpatient hospitalization in a a mental health unit for safety and stabilization. Delfin Gant, NP-PMHNP-BC 08/12/2022, 4:29 PM

## 2022-08-12 NOTE — ED Notes (Signed)
[facility] called and states that they have no beds, will wait for discharges before accepting pt.

## 2022-08-12 NOTE — ED Notes (Signed)
Pt calm and cooperative throughout shift. Pt adhered to medication. No combativeness, agitation, confusion noted throughout shift.

## 2022-08-13 LAB — CBG MONITORING, ED
Glucose-Capillary: 133 mg/dL — ABNORMAL HIGH (ref 70–99)
Glucose-Capillary: 119 mg/dL — ABNORMAL HIGH (ref 70–99)
Glucose-Capillary: 92 mg/dL (ref 70–99)

## 2022-08-13 NOTE — Progress Notes (Addendum)
CSW requested that pt be reviewed by [facility]. CSW will assist and follow with placement.   Benjaman Kindler, MSW, LCSWA 08/13/2022 7:04 AM

## 2022-08-13 NOTE — ED Notes (Signed)
Sleeping  no signs of distress noted respirations appear easy and no sleep disturbance noted.

## 2022-08-13 NOTE — Progress Notes (Addendum)
Pt was accept to [place] PENDING HIV medications- 10 days and Negative COVID-19  Pt meets inpatient criteria per Delfin Gant, NP   Attending Physician will be Claudie Revering, MD  Report can be called to: [phone] or [phone]   Pt can arrive after 8:00am   Care Team notified: Jeanie Sewer, RN, Delfin Gant, NP   -CSW provided update to provider about needing medication.   Nadara Mode, LCSWA 08/13/2022 @ 8:19 PM

## 2022-08-13 NOTE — Progress Notes (Signed)
Pt has been denied by [facility] per Dr. Louis Meckel. Pt continues to meet inpatient behavioral health placement per Delfin Gant, NP   Destination  Service Provider Address Phone Fax  [facility]  [address] [place] [address] [phone]  [facility]  [place], [place] [address] [phone]  [facility]  [address], [place] [address] [phone]  [facility]  [address] [place], [place] [address] [phone] [facility]  [address] [place] [address] [phone] [place]  [address] [place] [address] [phone] [facility]  [address], [place] [address] [phone] [place]  [address] [place] [address] [phone] [facility]  [place], [place] [address] [phone] [place], [place] [address] [phone]  [facility]  [address] [place] [address] [phone] [place]  [place], [place] [address] [phone]  [address] [place] [address] [phone]  [facility]  [address] [place] [address] [phone]  [facility] [address], [place] [address] [phone]  [facility]  [address], [place] [address] [phone] [facility]  [address] [place] [address] [phone] [facility]  [address],  [place] [address] [phone]  [facility]  [place], [place] [address] [place]  [facility]  [address], [place] [address] [phone] [facility]  [address] [place] [address] [place], [place] [address] [phone] [facility]  [place], [place] [address] [phone] [place]  [address] [place] [address] [place]  [place]  [address] [place], [place] [address] [place]

## 2022-08-13 NOTE — Progress Notes (Signed)
[facility] Psych ED Progress Note  08/13/2022 6:16 PM Michael [name]  MRN:  [id number]   Subjective:  Michael [name] is a 71 y.o. male patient admitted with a history of depression. His initial reason for seeking evaluation was chest pain and suicidal ideations, although he clarifies he has no specific plan to act on these thoughts.  Patient was seen awake alert and oriented x 5.  Patient remains suicidal, depressed, worthless and hopeless.  Patient was seen on rounds today calm and cooperative.  He is compliant with his medications.  We discussed discharge plan for tomorrow morning on time for him to get to a shelter and [facility] where he will benefit from Mental healthcare.  Patient have been endorsing suicide with no plan.  We will discharge in am with safety plan in place for him. Principal Problem: MDD (major depressive disorder), recurrent severe, without psychosis ([place]) Diagnosis:  Principal Problem:   MDD (major depressive disorder), recurrent severe, without psychosis ([place]) Active Problems:   Cocaine use disorder [facility])   ED Assessment Time Calculation: Start Time: 1805 Stop Time: 1815 Total Time in Minutes (Assessment Completion): 10   Past Psychiatric History: see initial Psychiatric evaluation note  [place]:  [place] ED from 08/10/2022 in [place] DEPT Most recent reading at 08/10/2022  3:08 PM ED from 08/10/2022 in [place] DEPT Most recent reading at 08/10/2022  3:25 AM ED from 07/12/2022 in [place] Most recent reading at 07/12/2022 12:09 PM  C-SSRS RISK CATEGORY High Risk Error: Q3, 4, or 5 should not be populated when Q2 is No High Risk       Past Medical History:  Past Medical History:  Diagnosis Date   A-fib ([place])    Asthma    No PFTs, history of childhood asthma   CAD (coronary artery disease)    a. 06/2013 STEMI/PCI ([facility]): LAD w/ thrombus (treated with  BMS), mid 75%, D2 75%; LCX OM2 75%; RCA small, PDA 95%, PLV 95%;  b. 10/2013 Cath/PCI: ISR w/in LAD (Promus DES x 2), borderline OM2 lesion;  c. 01/2014 MV: Intermediate risk, medium-sized distal ant wall infarct w/ very small amt of peri-infarct ischemia. EF 60%.   Cellulitis 04/2014   left facial   Cellulitis and abscess of toe of right foot 12/08/2019   Chondromalacia of medial femoral condyle    Left knee MRI 04/28/12: Chondromalacia of the medial femoral condyle with slight peripheral degeneration of the meniscocapsular junction of the medial meniscus; followed by sports medicine   Collagen vascular disease ([place])    Crack cocaine use    for 20+ years, has been enrolled in detox programs in the past   Depression    with history of hospitalization for suicidal ideation   Diabetes mellitus 2002   Diagnosed in 2002, started insulin in 2012   Gout    Gout 04/28/2012   Headache(784.0)    CT head 08/2011: Periventricular and subcortical white matter hypodensities are most in keeping with chronic microangiopathic change   HIV infection [facility]) Nov 2012   Followed by [name]   Hyperlipidemia    Hypertension    Pulmonary embolism [facility])     Past Surgical History:  Procedure Laterality Date   AMPUTATION Right 07/21/2019   Procedure: RIGHT SECOND TOE AMPUTATION;  Surgeon: [name], MD;  Location: [place];  Service: Orthopedics;  Laterality: Right;   [place]  BOWEL RESECTION     CARDIAC SURGERY     CERVICAL SPINE SURGERY     " rods in my neck "   CORONARY ARTERY BYPASS GRAFT     CORONARY STENT PLACEMENT     NM MYOCAR PERF WALL MOTION  12/27/2011   normal   SPINE SURGERY     Family History:  Family History  Problem Relation Age of Onset   Diabetes Mother    Hypertension Mother    Hyperlipidemia Mother    Diabetes Father    Cancer Father    Hypertension Father    Diabetes Brother    Heart disease Brother    Diabetes Sister    Colon cancer Neg Hx    Family  Psychiatric  History: see initial Psychiatric note Social History:  Social History   Substance and Sexual Activity  Alcohol Use No   Alcohol/week: 4.0 standard drinks of alcohol   Types: 2 Cans of beer, 2 Shots of liquor per week     Social History   Substance and Sexual Activity  Drug Use Not Currently   Frequency: 4.0 times per week   Types: "Crack" cocaine, Cocaine   Comment: Recent use of crack    Social History   Socioeconomic History   Marital status: Widowed    Spouse name: Not on file   Number of children: 2   Years of education: 5   Highest education level: 12th grade  Occupational History    Employer: UNEMPLOYED    Comment: 04/2016  Tobacco Use   Smoking status: Never   Smokeless tobacco: Never  Vaping Use   Vaping Use: Never used  Substance and Sexual Activity   Alcohol use: No    Alcohol/week: 4.0 standard drinks of alcohol    Types: 2 Cans of beer, 2 Shots of liquor per week   Drug use: Not Currently    Frequency: 4.0 times per week    Types: "Crack" cocaine, Cocaine    Comment: Recent use of crack   Sexual activity: Yes    Comment: DECLINED CONDOMS  Other Topics Concern   Not on file  Social History Narrative   Currently staying with a friend in [place].  Was staying @ local motel until a few days ago - left b/c of bed bugs.   Picked up from Extended Stay.  Not followed by a psychiatrist.   Social Determinants of Health   Financial Resource Strain: Medium Risk (09/16/2018)   Overall Financial Resource Strain (CARDIA)    Difficulty of Paying Living Expenses: Somewhat hard  Food Insecurity: Food Insecurity Present (07/05/2022)   Hunger Vital Sign    Worried About Running Out of Food in the Last Year: Sometimes true    Ran Out of Food in the Last Year: Sometimes true  Transportation Needs: Unmet Transportation Needs (07/05/2022)   PRAPARE - Hydrologist (Medical): Yes    Lack of Transportation (Non-Medical): Yes  Physical  Activity: Unknown (04/22/2019)   Exercise Vital Sign    Days of Exercise per Week: Not on file    Minutes of Exercise per Session: 0 min  Stress: Stress Concern Present (09/16/2018)   [place]    Feeling of Stress : Very much  Social Connections: Socially Isolated (09/16/2018)   Social Connection and Isolation Panel [NHANES]    Frequency of Communication with Friends and Family: Once a week    Frequency of Social Gatherings  with Friends and Family: Never    Attends Religious Services: Never    Marine scientist or Organizations: No    Attends Archivist Meetings: Never    Marital Status: Widowed    Sleep: Fair  Appetite:  Poor  Current Medications: Current Facility-Administered Medications  Medication Dose Route Frequency Provider Last Rate Last Admin   albuterol (PROVENTIL) (2.5 MG/3ML) 0.083% nebulizer solution 2.5 mg  2.5 mg Nebulization Q6H PRN Valarie Merino, MD       apixaban Arne [place]) tablet 5 mg  5 mg Oral BID Valarie Merino, MD   5 mg at 08/13/22 8119   aspirin EC tablet 81 mg  81 mg Oral Daily Valarie Merino, MD   81 mg at 08/13/22 0924   atorvastatin (LIPITOR) tablet 40 mg  40 mg Oral Daily Valarie Merino, MD   40 mg at 08/13/22 1478   clopidogrel (PLAVIX) tablet 75 mg  75 mg Oral Daily Valarie Merino, MD   75 mg at 08/13/22 0924   diltiazem (CARDIZEM CD) 24 hr capsule 120 mg  120 mg Oral BID Valarie Merino, MD   120 mg at 08/13/22 2956   rilpivirine (EDURANT) tablet 25 mg  25 mg Oral QAC lunch Valarie Merino, MD   25 mg at 08/13/22 1248   And   dolutegravir (TIVICAY) tablet 50 mg  50 mg Oral QAC lunch Valarie Merino, MD   50 mg at 08/13/22 1248   FLUoxetine (PROZAC) capsule 30 mg  30 mg Oral Daily Brandn Mcgath C, NP   30 mg at 08/13/22 0922   insulin aspart (novoLOG) injection 4 Units  4 Units Subcutaneous TID WC Valarie Merino, MD   4 Units at 08/13/22 1248    insulin glargine-yfgn (SEMGLEE) injection 6 Units  6 Units Subcutaneous Daily Valarie Merino, MD   6 Units at 08/13/22 0925   nitroGLYCERIN (NITROSTAT) SL tablet 0.4 mg  0.4 mg Sublingual Q5 min PRN Valarie Merino, MD       pantoprazole (PROTONIX) EC tablet 40 mg  40 mg Oral Daily PRN Valarie Merino, MD       sevelamer carbonate (RENVELA) tablet 800 mg  800 mg Oral TID WC Valarie Merino, MD   800 mg at 08/13/22 1726   umeclidinium bromide (INCRUSE ELLIPTA) 62.5 MCG/ACT 1 puff  1 puff Inhalation Daily Valarie Merino, MD   1 puff at 08/13/22 2130   Current Outpatient Medications  Medication Sig Dispense Refill   albuterol (VENTOLIN HFA) 108 (90 Base) MCG/ACT inhaler Inhale 1-2 puffs into the lungs every 6 (six) hours as needed for wheezing or shortness of breath. 8.5 g 1   amLODipine (NORVASC) 10 MG tablet Take 1 tablet (10 mg total) by mouth daily. 30 tablet 11   apixaban (ELIQUIS) 5 MG TABS tablet Take 1 tablet (5 mg total) by mouth 2 (two) times daily. 60 tablet 0   aspirin EC 81 MG tablet Take 81 mg by mouth daily. Swallow whole.     atorvastatin (LIPITOR) 40 MG tablet Take 40 mg by mouth daily.     clopidogrel (PLAVIX) 75 MG tablet Take 1 tablet (75 mg total) by mouth daily. 60 tablet 0   diltiazem (CARDIZEM CD) 120 MG 24 hr capsule Take 120 mg by mouth 2 (two) times daily.     dolutegravir-rilpivirine (JULUCA) 50-25 MG tablet Take 1 tablet by mouth daily before lunch. 30 tablet 0  FLUoxetine (PROZAC) 20 MG capsule Take 20 mg by mouth daily.     gabapentin (NEURONTIN) 300 MG capsule Take 1 capsule (300 mg total) by mouth at bedtime. 60 capsule 0   INCRUSE ELLIPTA 62.5 MCG/ACT AEPB Inhale 1 puff into the lungs daily.     insulin glargine (LANTUS SOLOSTAR) 100 UNIT/ML Solostar Pen Inject 6 Units into the skin daily. 6 mL 3   insulin lispro (HUMALOG KWIKPEN) 100 UNIT/ML KwikPen Inject 4 Units into the skin 3 (three) times daily with meals. 15 mL 0   nitroGLYCERIN (NITROSTAT) 0.4 MG SL  tablet Place 1 tablet (0.4 mg total) under the tongue every 5 (five) minutes as needed for chest pain. 25 tablet 0   pantoprazole (PROTONIX) 40 MG tablet Take 1 tablet (40 mg total) by mouth daily as needed (for heartburn). 30 tablet 2   Semaglutide,0.25 or 0.'5MG'$ /DOS, 2 MG/3ML SOPN Inject 0.5 mg into the skin once a week. 3 mL 0   sevelamer carbonate (RENVELA) 800 MG tablet Take 800 mg by mouth 3 (three) times daily.     traZODone (DESYREL) 100 MG tablet Take 100 mg by mouth at bedtime.     budesonide-formoterol (SYMBICORT) 160-4.5 MCG/ACT inhaler Inhale 2 puffs into the lungs in the morning and at bedtime. (Patient not taking: Reported on 07/02/2022) 10.2 g 2   buPROPion (WELLBUTRIN XL) 150 MG 24 hr tablet Take 1 tablet (150 mg total) by mouth every morning. (Patient not taking: Reported on 07/02/2022) 30 tablet 3   Insulin Pen Needle 31G X 5 MM MISC Use with insulin pens (Patient taking differently: 1 each by Other route See admin instructions. Use with insulin pens) 100 each 1    Lab Results:  Results for orders placed or performed during the hospital encounter of 08/10/22 (from the past 48 hour(s))  CBG monitoring, ED     Status: Abnormal   Collection Time: 08/11/22  7:31 PM  Result Value Ref Range   Glucose-Capillary 137 (H) 70 - 99 mg/dL    Comment: Glucose reference range applies only to samples taken after fasting for at least 8 hours.  CBG monitoring, ED     Status: Abnormal   Collection Time: 08/12/22 10:00 AM  Result Value Ref Range   Glucose-Capillary 149 (H) 70 - 99 mg/dL    Comment: Glucose reference range applies only to samples taken after fasting for at least 8 hours.  CBG monitoring, ED     Status: Abnormal   Collection Time: 08/13/22  8:46 AM  Result Value Ref Range   Glucose-Capillary 133 (H) 70 - 99 mg/dL    Comment: Glucose reference range applies only to samples taken after fasting for at least 8 hours.  CBG monitoring, ED     Status: Abnormal   Collection Time:  08/13/22 12:41 PM  Result Value Ref Range   Glucose-Capillary 119 (H) 70 - 99 mg/dL    Comment: Glucose reference range applies only to samples taken after fasting for at least 8 hours.  CBG monitoring, ED     Status: None   Collection Time: 08/13/22  5:13 PM  Result Value Ref Range   Glucose-Capillary 92 70 - 99 mg/dL    Comment: Glucose reference range applies only to samples taken after fasting for at least 8 hours.    Blood Alcohol level:  Lab Results  Component Value Date   [facility] <10 08/10/2022   ETH <10 11/20/2021    Physical Findings:  CIWA:    COWS:  Musculoskeletal: Strength & Muscle Tone:  seen lying down in bed Gait & Station:  seen in bed Patient leans:  see above.  Psychiatric Specialty Exam:  Presentation  General Appearance:  Casual  Eye Contact: Good  Speech: Clear and Coherent; Normal Rate  Speech Volume: Normal  Handedness: Right   Mood and Affect  Mood: Depressed; Hopeless; Worthless  Affect: Congruent; Depressed   Thought Process  Thought Processes: Coherent  Descriptions of Associations:Intact  Orientation:Full (Time, Place and Person)  Thought Content:Logical  History of Schizophrenia/Schizoaffective disorder:No  Duration of Psychotic Symptoms:N/A  Hallucinations:Hallucinations: None  Ideas of Reference:None  Suicidal Thoughts:Suicidal Thoughts: Yes, Active SI Active Intent and/or Plan: With Intent; With Plan  Homicidal Thoughts:Homicidal Thoughts: No   Sensorium  Memory: Immediate Good; Recent Good; Remote Good  Judgment: Impaired  Insight: Present   Executive Functions  Concentration: Good  Attention Span: Good  Recall: Good  Fund of Knowledge: Good  Language: Fair   Psychomotor Activity  Psychomotor Activity: Psychomotor Activity: Normal   Assets  Assets: Communication Skills; Desire for Improvement; Financial Resources/Insurance   Sleep  Sleep: Sleep: Fair    Physical  Exam: Physical Exam Vitals and nursing note reviewed.  Constitutional:      Appearance: He is well-developed.  HENT:     Head: Normocephalic and atraumatic.     Nose: Nose normal.  Cardiovascular:     Rate and Rhythm: Normal rate.  Pulmonary:     Effort: Pulmonary effort is normal.  Musculoskeletal:        General: Normal range of motion.     Cervical back: Normal range of motion.  Skin:    General: Skin is warm and dry.  Neurological:     Mental Status: He is alert and oriented to person, place, and time.    Review of Systems  Constitutional: Negative.   HENT: Negative.    Eyes: Negative.   Respiratory: Negative.    Cardiovascular: Negative.   Gastrointestinal: Negative.   Genitourinary: Negative.   Musculoskeletal: Negative.   Skin: Negative.   Neurological: Negative.   Endo/Heme/Allergies: Negative.   Psychiatric/Behavioral:  Positive for depression, substance abuse and suicidal ideas. The patient is nervous/anxious and has insomnia.    Blood pressure (!) 141/77, pulse 61, temperature 98.9 F (37.2 C), temperature source Oral, resp. rate 17, height '5\' 8"'$  (1.727 m), weight 72.6 kg, SpO2 99 %. Body mass index is 24.33 kg/m.   Medical Decision Making: Patient.remains suicidal with no plan.  He continues to take his medication.  TOC will be in for assistance with housing -shelter since this is one of his triggers-homelessness.  We plan for discharge in am.  Problem 1: Major Depressive disorder, severe without Psychotic features Problem 2: Cocaine use disorder Seek inpatient hospitalization in a a mental health unit for safety and stabilization. Delfin Gant, NP-PMHNP-BC 08/13/2022, 6:16 PM

## 2022-08-13 NOTE — ED Notes (Signed)
Sleeping easily awakened denies pain at this time affect remains flat admits to feelings of SI but denies plan.

## 2022-08-13 NOTE — ED Notes (Signed)
Blood glucose 92

## 2022-08-13 NOTE — ED Notes (Signed)
Patient alert and oriented this shift.  Cooperative with care.  Patient medication compliant.  Patient endorsed suicidal ideation with an intent and no plan.  No hallucinations noted.  Patient ambulatory.  Patient able to complete ADLs.

## 2022-08-13 NOTE — ED Provider Notes (Signed)
Emergency Medicine Observation Re-evaluation Note  Michael [name] is a 71 y.o. male, seen on rounds today.  Pt initially presented to the ED for complaints of Suicidal and Shortness of Breath Currently, the patient is watching TV.  Physical Exam  BP (!) 141/77   Pulse 61   Temp 98.9 F (37.2 C) (Oral)   Resp 17   Ht '5\' 8"'$  (1.727 m)   Wt 72.6 kg   SpO2 99%   BMI 24.33 kg/m  Physical Exam General: nad Cardiac: rr Lungs: clear Psych: calm and cooperative  ED Course / MDM  EKG:EKG Interpretation  Date/Time:  Saturday August 10 2022 15:57:43 EST Ventricular Rate:  73 PR Interval:  141 QRS Duration: 96 QT Interval:  413 QTC Calculation: 456 R Axis:   80 Text Interpretation: Sinus rhythm Atrial premature complex Confirmed by [place] ([id number]) on 08/11/2022 11:44:44 AM  I have reviewed the labs performed to date as well as medications administered while in observation.  Recent changes in the last 24 hours include none.  Plan  Current plan is for inpt gero psych.    Blanchie Dessert, MD 08/13/22 1511

## 2022-08-14 LAB — CREATININE, SERUM
Creatinine, Ser: 2.57 mg/dL — ABNORMAL HIGH (ref 0.61–1.24)
GFR, Estimated: 26 mL/min — ABNORMAL LOW (ref >60)

## 2022-08-14 LAB — CBC
HCT: 28.1 % — ABNORMAL LOW (ref 39.0–52.0)
Hemoglobin: 9 g/dL — ABNORMAL LOW (ref 13.0–17.0)
MCH: 27.7 pg (ref 26.0–34.0)
MCHC: 32 g/dL (ref 30.0–36.0)
MCV: 86.5 fL (ref 80.0–100.0)
Platelets: 140 10*3/uL — ABNORMAL LOW (ref 150–400)
RBC: 3.25 MIL/uL — ABNORMAL LOW (ref 4.22–5.81)
RDW: 12.7 % (ref 11.5–15.5)
WBC: 4.1 10*3/uL (ref 4.0–10.5)
nRBC: 0 % (ref 0.0–0.2)

## 2022-08-14 LAB — SARS CORONAVIRUS 2 BY RT PCR: SARS Coronavirus 2 by RT PCR: NEGATIVE

## 2022-08-14 NOTE — ED Notes (Signed)
Report called to: [phone] or [phone], there was no answer. Left message will follow up.

## 2022-08-14 NOTE — ED Notes (Signed)
Pt was accept to [facility] PENDING HIV medications- 10 days and Negative COVID-19  Pt meets inpatient criteria per Delfin Gant, NP   Attending Physician will be Claudie Revering, MD  Report can be called to: [phone] or [phone]   Pt can arrive after 8:00am

## 2022-08-14 NOTE — ED Notes (Signed)
Safe transport contacted for Tx.

## 2022-08-14 NOTE — ED Notes (Signed)
Pt was accept to [facility] PENDING HIV medications- 10 days and Negative COVID-19  Pt meets inpatient criteria per Delfin Gant, NP   Attending Physician will be Claudie Revering, MD  Report can be called to: [phone] or [phone]   Pt can arrive after 8:00am

## 2022-08-14 NOTE — ED Notes (Signed)
Blood  and COVID speicmens obtained and set to  lab

## 2022-08-14 NOTE — ED Provider Notes (Signed)
Emergency Medicine Observation Re-evaluation Note  Michael [name] is a 71 y.o. male, seen on rounds today.  Pt initially presented to the ED for complaints of Suicidal and Shortness of Breath Currently, the patient is awaiting transfer to [facility].  Physical Exam  BP (!) 148/88   Pulse 70   Temp 98.4 F (36.9 C)   Resp 18   Ht '5\' 8"'$  (1.727 m)   Wt 72.6 kg   SpO2 99%   BMI 24.33 kg/m  Physical Exam General: Calm Cardiac: well perfused Lungs: even respirations Psych: Calm  ED Course / MDM  EKG:EKG Interpretation  Date/Time:  Saturday August 10 2022 15:57:43 EST Ventricular Rate:  73 PR Interval:  141 QRS Duration: 96 QT Interval:  413 QTC Calculation: 456 R Axis:   80 Text Interpretation: Sinus rhythm Atrial premature complex Confirmed by [place] ([id number]) on 08/11/2022 11:44:44 AM  I have reviewed the labs performed to date as well as medications administered while in observation.  Recent changes in the last 24 hours include patient stable for transfer.  Plan  Current plan is for transfer to [facility].    Margette Fast, MD 08/20/22 [phone]

## 2022-09-05 ENCOUNTER — Encounter (HOSPITAL_COMMUNITY): Payer: Self-pay | Admitting: Emergency Medicine

## 2022-09-05 ENCOUNTER — Other Ambulatory Visit: Payer: Self-pay

## 2022-09-05 ENCOUNTER — Observation Stay (HOSPITAL_COMMUNITY)
Admission: EM | Admit: 2022-09-05 | Discharge: 2022-09-06 | Disposition: A | Discharge status: Home / Self Care | Payer: Medicare Other | Attending: Family Medicine | Admitting: Family Medicine

## 2022-09-05 ENCOUNTER — Emergency Department (HOSPITAL_COMMUNITY): Payer: Medicare Other

## 2022-09-05 ENCOUNTER — Observation Stay (HOSPITAL_BASED_OUTPATIENT_CLINIC_OR_DEPARTMENT_OTHER): Payer: Medicare Other

## 2022-09-05 DIAGNOSIS — R7989 Other specified abnormal findings of blood chemistry: Secondary | ICD-10-CM | POA: Diagnosis not present

## 2022-09-05 DIAGNOSIS — E1122 Type 2 diabetes mellitus with diabetic chronic kidney disease: Secondary | ICD-10-CM | POA: Insufficient documentation

## 2022-09-05 DIAGNOSIS — R0602 Shortness of breath: Secondary | ICD-10-CM | POA: Diagnosis present

## 2022-09-05 DIAGNOSIS — Z794 Long term (current) use of insulin: Secondary | ICD-10-CM | POA: Insufficient documentation

## 2022-09-05 DIAGNOSIS — I25118 Atherosclerotic heart disease of native coronary artery with other forms of angina pectoris: Secondary | ICD-10-CM

## 2022-09-05 DIAGNOSIS — I480 Paroxysmal atrial fibrillation: Secondary | ICD-10-CM | POA: Diagnosis not present

## 2022-09-05 DIAGNOSIS — Z7901 Long term (current) use of anticoagulants: Secondary | ICD-10-CM | POA: Insufficient documentation

## 2022-09-05 DIAGNOSIS — Z7902 Long term (current) use of antithrombotics/antiplatelets: Secondary | ICD-10-CM | POA: Diagnosis not present

## 2022-09-05 DIAGNOSIS — B20 Human immunodeficiency virus [HIV] disease: Secondary | ICD-10-CM | POA: Diagnosis not present

## 2022-09-05 DIAGNOSIS — E11621 Type 2 diabetes mellitus with foot ulcer: Secondary | ICD-10-CM

## 2022-09-05 DIAGNOSIS — Z955 Presence of coronary angioplasty implant and graft: Secondary | ICD-10-CM | POA: Diagnosis not present

## 2022-09-05 DIAGNOSIS — N1831 Chronic kidney disease, stage 3a: Secondary | ICD-10-CM

## 2022-09-05 DIAGNOSIS — N184 Chronic kidney disease, stage 4 (severe): Secondary | ICD-10-CM | POA: Diagnosis not present

## 2022-09-05 DIAGNOSIS — I130 Hypertensive heart and chronic kidney disease with heart failure and stage 1 through stage 4 chronic kidney disease, or unspecified chronic kidney disease: Secondary | ICD-10-CM | POA: Diagnosis not present

## 2022-09-05 DIAGNOSIS — I2510 Atherosclerotic heart disease of native coronary artery without angina pectoris: Secondary | ICD-10-CM | POA: Diagnosis present

## 2022-09-05 DIAGNOSIS — Z86711 Personal history of pulmonary embolism: Secondary | ICD-10-CM | POA: Diagnosis not present

## 2022-09-05 DIAGNOSIS — E1165 Type 2 diabetes mellitus with hyperglycemia: Secondary | ICD-10-CM | POA: Diagnosis not present

## 2022-09-05 DIAGNOSIS — J45909 Unspecified asthma, uncomplicated: Secondary | ICD-10-CM | POA: Insufficient documentation

## 2022-09-05 DIAGNOSIS — Z951 Presence of aortocoronary bypass graft: Secondary | ICD-10-CM | POA: Diagnosis not present

## 2022-09-05 DIAGNOSIS — R079 Chest pain, unspecified: Secondary | ICD-10-CM | POA: Diagnosis present

## 2022-09-05 DIAGNOSIS — F1420 Cocaine dependence, uncomplicated: Secondary | ICD-10-CM | POA: Diagnosis present

## 2022-09-05 DIAGNOSIS — R0789 Other chest pain: Secondary | ICD-10-CM | POA: Diagnosis not present

## 2022-09-05 DIAGNOSIS — Z79899 Other long term (current) drug therapy: Secondary | ICD-10-CM | POA: Insufficient documentation

## 2022-09-05 DIAGNOSIS — Z7982 Long term (current) use of aspirin: Secondary | ICD-10-CM | POA: Insufficient documentation

## 2022-09-05 DIAGNOSIS — N183 Chronic kidney disease, stage 3 unspecified: Secondary | ICD-10-CM | POA: Diagnosis present

## 2022-09-05 DIAGNOSIS — I5022 Chronic systolic (congestive) heart failure: Secondary | ICD-10-CM | POA: Diagnosis not present

## 2022-09-05 DIAGNOSIS — M86271 Subacute osteomyelitis, right ankle and foot: Secondary | ICD-10-CM

## 2022-09-05 HISTORY — DX: Anemia, unspecified: D64.9

## 2022-09-05 HISTORY — DX: Chronic kidney disease, stage 4 (severe): N18.4

## 2022-09-05 LAB — ECHOCARDIOGRAM COMPLETE
AR max vel: 1.99 cm2
AV Area VTI: 1.93 cm2
AV Area mean vel: 1.82 cm2
AV Mean grad: 4 mmHg
AV Peak grad: 6.6 mmHg
Ao pk vel: 1.28 m/s
Area-P 1/2: 4.68 cm2
Calc EF: 40.8 %
MV M vel: 3.47 m/s
MV Peak grad: 48.1 mmHg
S' Lateral: 3.8 cm
Single Plane A2C EF: 32.3 %
Single Plane A4C EF: 48.5 %

## 2022-09-05 LAB — CBC WITH DIFFERENTIAL/PLATELET
Abs Immature Granulocytes: 0.15 10*3/uL — ABNORMAL HIGH (ref 0.00–0.07)
Basophils Absolute: 0 10*3/uL (ref 0.0–0.1)
Basophils Relative: 0 %
Eosinophils Absolute: 0.1 10*3/uL (ref 0.0–0.5)
Eosinophils Relative: 2 %
HCT: 30.6 % — ABNORMAL LOW (ref 39.0–52.0)
Hemoglobin: 9.7 g/dL — ABNORMAL LOW (ref 13.0–17.0)
Immature Granulocytes: 2 %
Lymphocytes Relative: 12 %
Lymphs Abs: 0.9 10*3/uL (ref 0.7–4.0)
MCH: 27.7 pg (ref 26.0–34.0)
MCHC: 31.7 g/dL (ref 30.0–36.0)
MCV: 87.4 fL (ref 80.0–100.0)
Monocytes Absolute: 0.5 10*3/uL (ref 0.1–1.0)
Monocytes Relative: 8 %
Neutro Abs: 5.3 10*3/uL (ref 1.7–7.7)
Neutrophils Relative %: 76 %
Platelets: 157 10*3/uL (ref 150–400)
RBC: 3.5 MIL/uL — ABNORMAL LOW (ref 4.22–5.81)
RDW: 14 % (ref 11.5–15.5)
WBC: 7.1 10*3/uL (ref 4.0–10.5)
nRBC: 0 % (ref 0.0–0.2)

## 2022-09-05 LAB — COMPREHENSIVE METABOLIC PANEL
ALT: 45 U/L — ABNORMAL HIGH (ref 0–44)
AST: 27 U/L (ref 15–41)
Albumin: 3.6 g/dL (ref 3.5–5.0)
Alkaline Phosphatase: 81 U/L (ref 38–126)
Anion gap: 13 (ref 5–15)
BUN: 39 mg/dL — ABNORMAL HIGH (ref 8–23)
CO2: 16 mmol/L — ABNORMAL LOW (ref 22–32)
Calcium: 8.7 mg/dL — ABNORMAL LOW (ref 8.9–10.3)
Chloride: 111 mmol/L (ref 98–111)
Creatinine, Ser: 2.65 mg/dL — ABNORMAL HIGH (ref 0.61–1.24)
GFR, Estimated: 25 mL/min — ABNORMAL LOW (ref >60)
Glucose, Bld: 197 mg/dL — ABNORMAL HIGH (ref 70–99)
Potassium: 4.9 mmol/L (ref 3.5–5.1)
Sodium: 140 mmol/L (ref 135–145)
Total Bilirubin: 0.3 mg/dL (ref 0.3–1.2)
Total Protein: 7.1 g/dL (ref 6.5–8.1)

## 2022-09-05 LAB — IRON AND TIBC
Iron: 62 ug/dL (ref 45–182)
Saturation Ratios: 23 % (ref 17.9–39.5)
TIBC: 274 ug/dL (ref 250–450)
UIBC: 212 ug/dL

## 2022-09-05 LAB — CBG MONITORING, ED: Glucose-Capillary: 236 mg/dL — ABNORMAL HIGH (ref 70–99)

## 2022-09-05 LAB — RAPID URINE DRUG SCREEN, HOSP PERFORMED
Amphetamines: NOT DETECTED
Barbiturates: NOT DETECTED
Benzodiazepines: NOT DETECTED
Cocaine: NOT DETECTED
Opiates: NOT DETECTED
Tetrahydrocannabinol: NOT DETECTED

## 2022-09-05 LAB — TROPONIN I (HIGH SENSITIVITY)
Troponin I (High Sensitivity): 24 ng/L — ABNORMAL HIGH (ref <18)
Troponin I (High Sensitivity): 36 ng/L — ABNORMAL HIGH (ref <18)
Troponin I (High Sensitivity): 56 ng/L — ABNORMAL HIGH (ref <18)
Troponin I (High Sensitivity): 29 ng/L — ABNORMAL HIGH (ref <18)

## 2022-09-05 LAB — HEMOGLOBIN AND HEMATOCRIT, BLOOD
HCT: 29.5 % — ABNORMAL LOW (ref 39.0–52.0)
Hemoglobin: 9.4 g/dL — ABNORMAL LOW (ref 13.0–17.0)

## 2022-09-05 LAB — FERRITIN: Ferritin: 464 ng/mL — ABNORMAL HIGH (ref 24–336)

## 2022-09-05 MED ORDER — DOLUTEGRAVIR-RILPIVIRINE 50-25 MG PO TABS
1.0000 | ORAL_TABLET | Freq: Every day | ORAL | Status: DC
  Administered 2022-09-05: 1 via ORAL
  Filled 2022-09-05 (×2): qty 1

## 2022-09-05 MED ORDER — CLOPIDOGREL BISULFATE 75 MG PO TABS
75.0000 mg | ORAL_TABLET | Freq: Every day | ORAL | Status: DC
  Administered 2022-09-05 – 2022-09-06 (×2): 75 mg via ORAL
  Filled 2022-09-05 (×2): qty 1

## 2022-09-05 MED ORDER — UMECLIDINIUM BROMIDE 62.5 MCG/ACT IN AEPB
1.0000 | INHALATION_SPRAY | Freq: Every day | RESPIRATORY_TRACT | Status: DC
  Administered 2022-09-06: 1 via RESPIRATORY_TRACT
  Filled 2022-09-05: qty 7

## 2022-09-05 MED ORDER — ASPIRIN 81 MG PO TBEC
81.0000 mg | DELAYED_RELEASE_TABLET | Freq: Every day | ORAL | Status: DC
  Administered 2022-09-06: 81 mg via ORAL
  Filled 2022-09-05: qty 1

## 2022-09-05 MED ORDER — LIDOCAINE 5 % EX PTCH
1.0000 | MEDICATED_PATCH | CUTANEOUS | Status: DC
  Administered 2022-09-05: 1 via TRANSDERMAL
  Filled 2022-09-05: qty 1

## 2022-09-05 MED ORDER — ATORVASTATIN CALCIUM 40 MG PO TABS
40.0000 mg | ORAL_TABLET | Freq: Every day | ORAL | Status: DC
  Administered 2022-09-06: 40 mg via ORAL
  Filled 2022-09-05: qty 1

## 2022-09-05 MED ORDER — FLUOXETINE HCL 20 MG PO CAPS
20.0000 mg | ORAL_CAPSULE | Freq: Every day | ORAL | Status: DC
  Administered 2022-09-06: 20 mg via ORAL
  Filled 2022-09-05: qty 1

## 2022-09-05 MED ORDER — DILTIAZEM HCL ER COATED BEADS 120 MG PO CP24
120.0000 mg | ORAL_CAPSULE | Freq: Every day | ORAL | Status: DC
  Administered 2022-09-05 – 2022-09-06 (×2): 120 mg via ORAL
  Filled 2022-09-05 (×2): qty 1

## 2022-09-05 MED ORDER — LORAZEPAM 2 MG/ML IJ SOLN
1.0000 mg | Freq: Four times a day (QID) | INTRAMUSCULAR | Status: DC
  Administered 2022-09-05 – 2022-09-06 (×3): 1 mg via INTRAVENOUS
  Filled 2022-09-05 (×3): qty 1

## 2022-09-05 MED ORDER — DILTIAZEM HCL ER COATED BEADS 120 MG PO CP24: 120.0000 mg | ORAL_CAPSULE | Freq: Two times a day (BID) | ORAL | Status: DC

## 2022-09-05 MED ORDER — ASPIRIN 81 MG PO CHEW
324.0000 mg | CHEWABLE_TABLET | ORAL | Status: AC
  Administered 2022-09-05: 324 mg via ORAL
  Filled 2022-09-05: qty 4

## 2022-09-05 MED ORDER — HEPARIN (PORCINE) 25000 UT/250ML-% IV SOLN
950.0000 [IU]/h | INTRAVENOUS | Status: DC
  Administered 2022-09-05: 850 [IU]/h via INTRAVENOUS
  Filled 2022-09-05: qty 250

## 2022-09-05 MED ORDER — NITROGLYCERIN 0.4 MG SL SUBL: 0.4000 mg | SUBLINGUAL_TABLET | SUBLINGUAL | Status: DC | PRN

## 2022-09-05 MED ORDER — SEVELAMER CARBONATE 800 MG PO TABS
800.0000 mg | ORAL_TABLET | Freq: Three times a day (TID) | ORAL | Status: DC
  Administered 2022-09-05 – 2022-09-06 (×2): 800 mg via ORAL
  Filled 2022-09-05 (×2): qty 1

## 2022-09-05 MED ORDER — ISOSORBIDE MONONITRATE ER 30 MG PO TB24
15.0000 mg | ORAL_TABLET | Freq: Every day | ORAL | Status: DC
  Administered 2022-09-05 – 2022-09-06 (×2): 15 mg via ORAL
  Filled 2022-09-05 (×2): qty 1

## 2022-09-05 MED ORDER — AMLODIPINE BESYLATE 5 MG PO TABS: 10.0000 mg | ORAL_TABLET | Freq: Every day | ORAL | Status: DC

## 2022-09-05 MED ORDER — TRAZODONE HCL 100 MG PO TABS
100.0000 mg | ORAL_TABLET | Freq: Every day | ORAL | Status: DC
  Administered 2022-09-05: 100 mg via ORAL
  Filled 2022-09-05: qty 1

## 2022-09-05 MED ORDER — ACETAMINOPHEN 325 MG PO TABS
650.0000 mg | ORAL_TABLET | ORAL | Status: DC | PRN
  Administered 2022-09-05: 650 mg via ORAL

## 2022-09-05 MED ORDER — ASPIRIN 300 MG RE SUPP: 300.0000 mg | RECTAL | Status: AC

## 2022-09-05 MED ORDER — INSULIN GLARGINE-YFGN 100 UNIT/ML ~~LOC~~ SOLN
6.0000 [IU] | Freq: Every day | SUBCUTANEOUS | Status: DC
  Administered 2022-09-05: 6 [IU] via SUBCUTANEOUS
  Filled 2022-09-05 (×2): qty 0.06

## 2022-09-05 MED ORDER — GABAPENTIN 300 MG PO CAPS
300.0000 mg | ORAL_CAPSULE | Freq: Every day | ORAL | Status: DC
  Administered 2022-09-05: 300 mg via ORAL
  Filled 2022-09-05: qty 1

## 2022-09-05 MED ORDER — ONDANSETRON HCL 4 MG/2ML IJ SOLN: 4.0000 mg | Freq: Four times a day (QID) | INTRAMUSCULAR | Status: DC | PRN

## 2022-09-05 MED ORDER — ALBUTEROL SULFATE (2.5 MG/3ML) 0.083% IN NEBU: 2.5000 mg | INHALATION_SOLUTION | Freq: Four times a day (QID) | RESPIRATORY_TRACT | Status: DC | PRN

## 2022-09-05 MED ORDER — PANTOPRAZOLE SODIUM 40 MG PO TBEC
40.0000 mg | DELAYED_RELEASE_TABLET | Freq: Every day | ORAL | Status: DC | PRN
  Administered 2022-09-05: 40 mg via ORAL
  Filled 2022-09-05: qty 1

## 2022-09-05 MED ORDER — APIXABAN 5 MG PO TABS: 5.0000 mg | ORAL_TABLET | Freq: Two times a day (BID) | ORAL | Status: DC

## 2022-09-05 MED ORDER — HEPARIN SODIUM (PORCINE) 5000 UNIT/ML IJ SOLN
4000.0000 [IU] | Freq: Once | INTRAMUSCULAR | Status: AC
  Administered 2022-09-05: 4000 [IU] via INTRAVENOUS

## 2022-09-05 MED ORDER — INSULIN ASPART 100 UNIT/ML IJ SOLN
0.0000 [IU] | Freq: Three times a day (TID) | INTRAMUSCULAR | Status: DC
  Administered 2022-09-05 – 2022-09-06 (×2): 3 [IU] via SUBCUTANEOUS

## 2022-09-05 MED ORDER — ASPIRIN 81 MG PO TBEC: 81.0000 mg | DELAYED_RELEASE_TABLET | Freq: Every day | ORAL | Status: DC

## 2022-09-05 NOTE — Consult Note (Addendum)
Cardiology Consultation   Patient ID: Michael [name] MRN: [id number]; DOB: January 22, 1951  Admit date: 09/05/2022 Date of Consult: 09/05/2022  PCP:  [name], MD   [facility] Prov[facility] Cardiologist:  None        Patient Profile:   Michael [name] is a 71 y.o. male with a hx of CAD s/p prior PCIs and CABG as below, CKD stage 4, cocaine use, chronic HFrEF, HTN, HIV, DM2, PAF, osteomyelitis s/p right second toe amputation 2020, depression with prior SI, homelessness, nonadherence to medication regimen who is being seen 09/05/2022 for the evaluation of chest pain at the request of Dr. Sherry Ruffing.  History of Present Illness:   [name] has had a significant amount of emergency room visits for a variety of issues including very frequent chest pain, cocaine use, suicidal ideation, and other medical problems including PNA, osteomyelitis, kidney failure. He does have significant hx of CAD though history is difficult to piece together as he has been seen at several difficult facilities. He was remotely admitted 06/2013 to [facility] with chest pain and anterior STEMI after several days of drug use. This was treated with BMS to prox and distal LAD. In 10/2013 he had restenosis of LAD treated with 2 DES. He had subsequent CABGx3 at [facility] in 01/2018 with SVG-PDA, SVG-OM2, LIMA-LAD, PVI with LAA occlusion.  He had DES x3 to RCA 06/2020 with repeat cath sooner after with successful PTCA to prox PDA. Last cath was in 02/2021 in setting of cocaine use, felt to overall be stable, recommended for medical therapy initially with plan to consider PCI of LAD if he followed up as outpatient and utox was negative for cocaine. He did not follow-up as outpatient thereafter. He has had approximately 37 ER/admission encounters between our system and [facility] in 2023 with 6 echocardiograms this year. Last echo 06/11/22 showed EF 40-45% with mild global HK, mildly dilated IVC, no significant valve  disease noted. This was downtrending from prior echoes with normal LVEF. He was reported to be on Xarelto at one point, but most recent medicine list indicates Eliquis. PAF and h/o PE listed in [place]. Last VQ scan 02/2022 was low probability for PE. Last DC summary in our systems outlined, "Patient was previously admitted to [facility] for over a month for the same symptoms of chest pain related to cocaine use. Full work-up was completed there, and they were unable to find appropriate housing for him at the time which led to delay in his disposition. He has family members all over [place], however none of them really speak with him anymore due to his cocaine use. After leaving [facility], he was at a [facility] in [place], checked himself out, came to [place] ended up using cocaine, and ended up in the emergency department at [facility]. Patient is a member of the [facility], and they are currently helping him finding a place to live."   He was discharged on ASA, Plavix, and Eliquis, all reported to be home medicines though the patient reports he does not reliably take his medicines. He's previously been declined by home health in other encounters.  He was also apparently recently admitted in [facility] but we do not have those records. He states he was down in [facility] for a detox program and was there for about a week then got sick and was admitted with pneumonia. He reportedly had blood in his stool and required blood transfusion but did not think he  had had any GI workup. He was discharged, got on a bus to [place], and felt weak so walked to the police station and they called EMS. Upon arrival here he also reported chest pain which he states has been present for about a year and a half. It comes and goes in severity, worse with exertion, certain movements in bed, and pressure to the left upper chest area, but has not gone away fully since it started. It continues even now. He states he had taken SL NTG  without any change in symptoms. Here, labs show Hgb 9, Cr 2.65, hsTroponin 24-36-56, Covid neg, CXR NAD. EKG shows NSR 73bpm, LVH with secondary repol changes, baseline wander with possible ST depression II, avF, V3-V6. Updated med rec has not yet been completed and patient cannot tell me what he is supposed to be taking. He denies following with any cardiologist as outpatient.   Past Medical History:  Diagnosis Date   A-fib ([place])    Anemia    Asthma    No PFTs, history of childhood asthma   CAD (coronary artery disease)    Cellulitis 04/2014   left facial   Cellulitis and abscess of toe of right foot 12/08/2019   Chondromalacia of medial femoral condyle    Left knee MRI 04/28/12: Chondromalacia of the medial femoral condyle with slight peripheral degeneration of the meniscocapsular junction of the medial meniscus; followed by sports medicine   Chronic kidney failure, stage 4 (severe) ([place])    Collagen vascular disease ([place])    Crack cocaine use    for 20+ years, has been enrolled in detox programs in the past   Depression    with history of hospitalization for suicidal ideation   Diabetes mellitus 2002   Diagnosed in 2002, started insulin in 2012   Gout    Headache(784.0)    CT head 08/2011: Periventricular and subcortical white matter hypodensities are most in keeping with chronic microangiopathic change   HIV infection ([place]) 08/2011   Followed by [name]   Hyperlipidemia    Hypertension    Pulmonary embolism [facility])     Past Surgical History:  Procedure Laterality Date   AMPUTATION Right 07/21/2019   Procedure: RIGHT SECOND TOE AMPUTATION;  Surgeon: [name], MD;  Location: [place];  Service: Orthopedics;  Laterality: Right;   BACK SURGERY     1988   BOWEL RESECTION     CARDIAC SURGERY     CERVICAL SPINE SURGERY     " rods in my neck "   CORONARY ARTERY BYPASS GRAFT     CORONARY STENT PLACEMENT     NM MYOCAR PERF WALL MOTION  12/27/2011   normal   SPINE SURGERY        Home Medications:  Prior to Admission medications   Medication Sig Start Date End Date Taking? Authorizing Provider  albuterol (VENTOLIN HFA) 108 (90 Base) MCG/ACT inhaler Inhale 1-2 puffs into the lungs every 6 (six) hours as needed for wheezing or shortness of breath. 10/24/21   [name], MD  amLODipine (NORVASC) 10 MG tablet Take 1 tablet (10 mg total) by mouth daily. 07/05/22 07/05/23  Nooruddin, Marlene Lard, MD  apixaban (ELIQUIS) 5 MG TABS tablet Take 1 tablet (5 mg total) by mouth 2 (two) times daily. 07/05/22   Nooruddin, Marlene Lard, MD  aspirin EC 81 MG tablet Take 81 mg by mouth daily. Swallow whole.    [provider]  atorvastatin (LIPITOR) 40 MG tablet Take 40  mg by mouth daily. 06/27/22   [provider]  budesonide-formoterol (SYMBICORT) 160-4.5 MCG/ACT inhaler Inhale 2 puffs into the lungs in the morning and at bedtime. Patient not taking: Reported on 07/02/2022 10/24/21   [name], MD  buPROPion (WELLBUTRIN XL) 150 MG 24 hr tablet Take 1 tablet (150 mg total) by mouth every morning. Patient not taking: Reported on 07/02/2022 10/24/21   [name], MD  clopidogrel (PLAVIX) 75 MG tablet Take 1 tablet (75 mg total) by mouth daily. 07/06/22   Nooruddin, Marlene Lard, MD  diltiazem (CARDIZEM CD) 120 MG 24 hr capsule Take 120 mg by mouth 2 (two) times daily. 06/28/22   [provider]  dolutegravir-rilpivirine (JULUCA) 50-25 MG tablet Take 1 tablet by mouth daily before lunch. 07/06/22   Nooruddin, Marlene Lard, MD  FLUoxetine (PROZAC) 20 MG capsule Take 20 mg by mouth daily. 06/27/22   [provider]  gabapentin (NEURONTIN) 300 MG capsule Take 1 capsule (300 mg total) by mouth at bedtime. 07/05/22   Nooruddin, Marlene Lard, MD  INCRUSE ELLIPTA 62.5 MCG/ACT AEPB Inhale 1 puff into the lungs daily. 06/27/22   [provider]  insulin glargine (LANTUS SOLOSTAR) 100 UNIT/ML Solostar Pen Inject 6 Units into the skin daily. 07/05/22   Nooruddin, Marlene Lard, MD  insulin  lispro (HUMALOG KWIKPEN) 100 UNIT/ML KwikPen Inject 4 Units into the skin 3 (three) times daily with meals. 10/24/21   [name], MD  Insulin Pen Needle 31G X 5 MM MISC Use with insulin pens Patient taking differently: 1 each by Other route See admin instructions. Use with insulin pens 10/24/21   [name], MD  nitroGLYCERIN (NITROSTAT) 0.4 MG SL tablet Place 1 tablet (0.4 mg total) under the tongue every 5 (five) minutes as needed for chest pain. 10/24/21   [name], MD  pantoprazole (PROTONIX) 40 MG tablet Take 1 tablet (40 mg total) by mouth daily as needed (for heartburn). 10/24/21   [name], MD  Semaglutide,0.25 or 0.'5MG'$ /DOS, 2 MG/3ML SOPN Inject 0.5 mg into the skin once a week. 07/05/22   Nooruddin, Marlene Lard, MD  sevelamer carbonate (RENVELA) 800 MG tablet Take 800 mg by mouth 3 (three) times daily. 06/27/22   [provider]  traZODone (DESYREL) 100 MG tablet Take 100 mg by mouth at bedtime.    [provider]    Inpatient Medications: Scheduled Meds:  Continuous Infusions:  PRN Meds:   Allergies:   No Known Allergies  Social History:   Social History   Socioeconomic History   Marital status: Widowed    Spouse name: Not on file   Number of children: 2   Years of education: 19   Highest education level: 12th grade  Occupational History    Employer: UNEMPLOYED    Comment: 04/2016  Tobacco Use   Smoking status: Never   Smokeless tobacco: Never  Vaping Use   Vaping Use: Never used  Substance and Sexual Activity   Alcohol use: No    Alcohol/week: 4.0 standard drinks of alcohol    Types: 2 Cans of beer, 2 Shots of liquor per week   Drug use: Not Currently    Frequency: 4.0 times per week    Types: "Crack" cocaine, Cocaine    Comment: Recent use of crack   Sexual activity: Yes    Comment: DECLINED CONDOMS  Other Topics Concern   Not on file  Social History Narrative   Currently staying with a friend in [place].  Was staying @  local motel until a few days ago - left b/c of bed bugs.   Picked up from [facility].  Not followed by a psychiatrist.   Social Determinants of Health   Financial Resource Strain: Medium Risk (09/16/2018)   Overall Financial Resource Strain (CARDIA)    Difficulty of Paying Living Expenses: Somewhat hard  Food Insecurity: Food Insecurity Present (07/05/2022)   Hunger Vital Sign    Worried About Running Out of Food in the Last Year: Sometimes true    Ran Out of Food in the Last Year: Sometimes true  Transportation Needs: Unmet Transportation Needs (07/05/2022)   PRAPARE - Hydrologist (Medical): Yes    Lack of Transportation (Non-Medical): Yes  Physical Activity: Unknown (04/22/2019)   Exercise Vital Sign    Days of Exercise per Week: Not on file    Minutes of Exercise per Session: 0 min  Stress: Stress Concern Present (09/16/2018)   [place]    Feeling of Stress : Very much  Social Connections: Socially Isolated (09/16/2018)   Social Connection and Isolation Panel [NHANES]    Frequency of Communication with Friends and Family: Once a week    Frequency of Social Gatherings with Friends and Family: Never    Attends Religious Services: Never    Marine scientist or Organizations: No    Attends Archivist Meetings: Never    Marital Status: Widowed  Intimate Partner Violence: Not At Risk (09/16/2018)   Humiliation, Afraid, Rape, and Kick questionnaire    Fear of Current or Ex-Partner: No    Emotionally Abused: No    Physically Abused: No    Sexually Abused: No    Family History:    Family History  Problem Relation Age of Onset   Diabetes Mother    Hypertension Mother    Hyperlipidemia Mother    Diabetes Father    Cancer Father    Hypertension Father    Diabetes Brother    Heart disease Brother    Diabetes Sister    Colon cancer Neg Hx      ROS:  Please see  the history of present illness.  All other ROS reviewed and negative.     Physical Exam/Data:   Vitals:   09/05/22 1200 09/05/22 1215 09/05/22 1230 09/05/22 1330  BP: (!) 163/84 (!) 174/85 (!) 140/80 (!) 157/81  Pulse: 60 61 (!) 126 62  Resp: 10 10 (!) 21 14  Temp:      TempSrc:      SpO2: 100% 100% 100% 100%   No intake or output data in the 24 hours ending 09/05/22 1440    08/10/2022    3:07 PM 08/10/2022    3:19 AM 07/12/2022   12:08 PM  Last 3 Weights  Weight (lbs) 160 lb 160 lb 158 lb  Weight (kg) 72.576 kg 72.576 kg 71.668 kg     There is no height or weight on file to calculate BMI.  General: Well developed, well nourished AAM, in no acute distress. Head: Normocephalic, atraumatic, sclera non-icteric, no xanthomas, nares are without discharge. Arcus senilis. Moderate decline in dentition Neck: Negative for carotid bruits. JVP not elevated. Lungs: Clear bilaterally to auscultation without wheezes, rales, or rhonchi. Breathing is unlabored. Heart: RRR S1 S2 without murmurs, rubs, or gallops.  Abdomen: Soft, non-tender, non-distended with normoactive bowel sounds. No rebound/guarding. Extremities: No clubbing or cyanosis. No edema. Distal pedal pulses are 2+ and  equal bilaterally. Neuro: Alert and oriented X 3. Moves all extremities spontaneously. Psych:  Responds to questions appropriately with a normal affect.   EKG:  The EKG was personally reviewed and demonstrates:  NSR 73bpm, LVH with secondary repol changes, baseline wander with possible ST depression II, avF, V3-V6  Telemetry:  Telemetry was personally reviewed and demonstrates:  NSR but data from prior ED bed not available  Relevant CV Studies: Echo 06/2022  SUMMARY  The left ventricular size is normal.  There is normal left ventricular wall thickness.  Left ventricular systolic function is mildly reduced.  LV ejection fraction = 40-45%.  There is mild global hypokinesis of the left ventricle.  The right  ventricle is normal in size and function.  The left atrial size is normal.  Right atrial size is normal.  There is aortic valve sclerosis.  The IVC was mildly dilated.  There is trivial pericardial effusion.  In comparison with images of exams of 03/21/22 and 04/26/22, in  retrospect, it appears LV dysfunction may have been present  previously, particularly on exam of 6/15, and that function today is  slmilar to 6/15 and slightly worse than 7/21, though these comparisons  are confounded by technical differences. Clinical correlation  suggested.  -  FINDINGS:  LEFT VENTRICLE  The left ventricular size is normal. There is normal left ventricular  wall thickness. Left ventricular systolic function is mildly reduced.  LV ejection fraction = 40-45%. Left ventricular filling pattern is  pseudonormal. There is mild global hypokinesis of the left ventricle.   -  RIGHT VENTRICLE  The right ventricle is normal in size and function.   LEFT ATRIUM  The left atrial size is normal.   RIGHT ATRIUM  Right atrial size is normal.  -  AORTIC VALVE  There is aortic valve sclerosis. The aortic valve is trileaflet. The  aortic valve opens well. There is no aortic stenosis. There is no  aortic regurgitation.  -  MITRAL VALVE  The mitral valve is normal in structure and function. There is trace  mitral regurgitation.  -  TRICUSPID VALVE  The tricuspid valve is normal in structure and function. There is  trace tricuspid regurgitation.  -  PULMONIC VALVE  The pulmonic valve is not well visualized. Trace pulmonic valvular  regurgitation.  -  ARTERIES  The aortic sinus is normal size.  -  VENOUS  Pulmonary venous flow pattern is normal. The IVC was mildly dilated.  -  EFFUSION  There is trivial pericardial effusion.  -   Cath 02/2021  Multi-vessel coronary artery disease.   LCP for elevated troponin and chest pain in the setting of known  obstructive CAD and cocaine use.  Right radial  artery (LIMA known atretic).  LM patent.  70% instent prox LAD disease 90% distal LAD lesion.  These appear  relatively unchanged from previous cath.  70% mid LCX then 99% OM lesion.  This OM is served by a patent SVG to OM  RCA has extensive stenting which are patent.  As seen in prior films 90%  ostium PDA that is jailed.  [place] for hemostasis  EBL<5 cc  No complications.    His CAD is stable.  In the setting of active cocaine use fresh stenting poses him a greater  risk then his stable CAD.  Recommend he follow up with cardiology (I am happy to see him) for Utox.   If negative for cocaine then will schedule him for outpatient PCI of LAD.  Coronary Findings Diagnostic Dominance: Right  Left Anterior Descending: Prox LAD lesion is 60% stenosed. Mid LAD lesion is 70% stenosed. The lesion was previously treated using a stent of unknown type. Dist LAD lesion is 90% stenosed.  Left Circumflex: Prox Cx lesion is 70% stenosed. First Obtuse Marginal Branch: 1st Mrg lesion is 99% stenosed.  Right Coronary Artery: Previously placed Prox RCA drug eluting stent is widely patent. Culprit lesion. The lesion was previously treated using angioplasty. Stent delivery was done by way of balloon expansion. Previously placed Mid RCA drug eluting stent is widely patent. Not the culprit lesion. The lesion was previously treated using angioplasty. Stent delivery was done by way of balloon expansion. Previously placed Dist RCA stent (unknown type) is widely patent. Right Posterior Descending Artery: RPDA lesion is 99% stenosed.  LIMA Graft To Dist LAD: The graft is atretic. Origin to Insertion lesion is 100% stenosed.  Graft To 1st Mrg  Vein Graft To RPDA: Origin to Insertion lesion is 100% stenosed.   Intervention  No interventions have been documented.   Laboratory Data:  High Sensitivity Troponin:   Recent Labs  Lab 08/10/22 0524 08/10/22 1950 09/05/22 0505 09/05/22 0610 09/05/22 0922   TROPONINIHS 15 13 24* 36* 56*     Chemistry Recent Labs  Lab 09/05/22 0505  NA 140  K 4.9  CL 111  CO2 16*  GLUCOSE 197*  BUN 39*  CREATININE 2.65*  CALCIUM 8.7*  GFRNONAA 25*  ANIONGAP 13    Recent Labs  Lab 09/05/22 0505  PROT 7.1  ALBUMIN 3.6  AST 27  ALT 45*  ALKPHOS 81  BILITOT 0.3   Lipids No results for input(s): "CHOL", "TRIG", "HDL", "LABVLDL", "LDLCALC", "CHOLHDL" in the last 168 hours.  Hematology Recent Labs  Lab 09/05/22 0505  WBC 7.1  RBC 3.50*  HGB 9.7*  HCT 30.6*  MCV 87.4  MCH 27.7  MCHC 31.7  RDW 14.0  PLT 157    Radiology/Studies:  DG Chest 2 View  Result Date: 09/05/2022 CLINICAL DATA:  Chest pain and shortness of breath. EXAM: CHEST - 2 VIEW COMPARISON:  August 10, 2022 FINDINGS: Multiple sternal wires are noted. The heart size and mediastinal contours are within normal limits. A left atrial appendage clip is noted. Both lungs are clear. A radiopaque fusion plate and screws are seen overlying the cervical spine. The visualized skeletal structures are unremarkable. IMPRESSION: No active cardiopulmonary disease. Electronically Signed   By: Virgina Norfolk M.D.   On: 09/05/2022 04:54     Assessment and Plan:   1. Generalized weakness 2. CAD with complex hx of PCIs, CABG, here with continuous CP x 1 year and mildly elevated troponin, unclear if demand ischemia vs USA/NSTEMI 3. CKD stage IV 4. Essential HTN 5. ? Reported history of PAF 6. Chronic HFrEF 7. Anemia with recent reported ?GIB s/p transfusion 8. IDDM 9. HIV  Very difficult situation with the patient's disjointed care, nonadherence to outpatient regimen, lack of outpatient follow-up, complicated by history of substance abuse. It is frankly difficult to piece together exactly what he truly should be taking as we do not have enough information from his recent hospital [place] another state. He's been on varying regimens at discharge from multiple facilities but reports he  does not reliably take his medicines at home so it is like we are starting fresh each time. His chest pain has mixed typical/atypical features, worse with exertion, position changes, and tender to palpation. His troponin is mildly elevated.  We do not have a UDS so we have ordered this. Given his nonadherence to medicine and follow-up as well as CKD and recent possible GIB, he is not a good candidate for invasive evaluation. From cardiac standpoint, he was presently ordered for '324mg'$  ASA, with additional '81mg'$  daily, Eliquis '5mg'$  BID, Plavix '75mg'$ , Imdur '15mg'$  daily, amlodipine '10mg'$  and diltiazem '120mg'$  BID. We would recommend to hold Eliquis until his hospital records from [facility] have been obtained, start heparin per pharmacy, continue ASA + Plavix until we can confirm from recent records that he did not have intervention at outside facility (last one we know of was in 2021), hold amlodipine to avoid dual therapy with 2 calcium channel blockers, and start with diltiazem '120mg'$  once daily instead of BID dosing given HR 60s. Would avoid beta blocker with history of cocaine use. Atorvastatin has been restarted. He has an echo ordered so we will see what this shows.   Ultimately it would seem that the most important factor in his prognosis would be securing a more stable social situation. We can continue to try to optimize him in the inpatient setting but remain concerned about what will happen at discharge.  Risk Assessment/Risk Scores:     TIMI Risk Score for Unstable Angina or Non-ST Elevation MI:   The patient's TIMI risk score is 6, which indicates a 41% risk of all cause mortality, new or recurrent myocardial infarction or need for urgent revascularization in the next 14 days.  New York Heart Association (NYHA) Functional Class NYHA Class II  CHA2DS2-VASc Score = 5   This indicates a 7.2% annual risk of stroke. The patient's score is based upon: CHF History: 1 HTN History: 1 Diabetes History: 1 Stroke  History: 0 Vascular Disease History: 1 Age Score: 1 Gender Score: 0         For questions or updates, please contact [place] Please consult [email or web address] for contact info under    Signed, Charlie Pitter, PA-C  09/05/2022 2:40 PM   Attending Note:   The patient was seen and examined.  Agree with assessment and plan as noted above.  Changes made to the above note as needed.  Patient seen and independently examined with Melina Copa, PA .   We discussed all aspects of the encounter. I agree with the assessment and plan as stated above.    Chest pain :   pt has known CAD,  hx of CABG, CKD, cocaine use, HTN, HIV, DM , PAF  chronic CP,   Has multiple challenges - homeless,  frequently runs out of / stops medications,    Presents with CP  - prolonged CP Present for 1 1/2 years  Was recently discharged from a rehab facility in [place] the bus up to [place] and made his way to the ER .  Says he is still having CP but appears comfortable in bed, eating lunch  He admits to not taking all of his meds Last cocaine use was ~ 15 days ago .  We discussed the short term and long term harm of cocaine. We discussed the issue with heart cath in the setting of CKD.  At present , he appears comfortable enough that I would propose continued medical therapy  Will hold off on cath at this point unless his condition worsens   2.  PAF :   has been prescribed Eliquis in the past.  Agree with heparin for now until we have a clear picture  of whether or not he will need further invasive testing   3.  HTN  :  will continue low dose Diltiazem 120 mg a day    I have spent a total of 40 minutes with patient reviewing hospital  notes , telemetry, EKGs, labs and examining patient as well as establishing an assessment and plan that was discussed with the patient.  > 50% of time was spent in direct patient care.    Thayer Headings, Brooke Bonito., MD, [facility] 09/05/2022, 3:41 PM [address]  [employer] Pager [phone]

## 2022-09-05 NOTE — ED Provider Notes (Signed)
7:19 AM Care assumed from Dr. Ralene Bathe.  At time of transfer of care, patient is waiting for a third troponin and for records from [facility] from recent mission.  Patient was found to have uptrending troponins so we will make sure it is not rising critically.  Anticipate reassessment after workup to determine disposition.  11:00 AM Third troponin surprisingly rose up to 56.  Going from 24-30 6-56.  Given the patient saying he still having some intermittent chest pain that feels like when he has had cardiac troubles, we will call cardiology.  I spoke to cardiology who recommends admission to medicine given the continued kidney dysfunction and his rising troponin.  They will see in consultation and medicine will admit.   Clinical Impression: 1. Elevated troponin   2. Chest pain, unspecified type     Disposition: Admit  This note was prepared with assistance of Dragon voice recognition software. Occasional wrong-word or sound-a-like substitutions may have occurred due to the inherent limitations of voice recognition software.       Zarriah Starkel, [name], MD 09/05/22 [phone]

## 2022-09-05 NOTE — Progress Notes (Signed)
  Echocardiogram 2D Echocardiogram has been performed.  Lana Fish 09/05/2022, 4:09 PM

## 2022-09-05 NOTE — ED Triage Notes (Addendum)
PT was in [place] getting treatment for drug addiction (cocaine abuse). He was sent to at hospital in [place], [place] and treated there for several week. He was discharged yesterday. He got on bus to return home and states he is not feeling better. He is homeless. Pt had walked from depo to police [place]. Sats normal once he got warmed up. Lung sounds clear per EMS. He states chest pain for months that has stayed the same in nature these past months.  Pt is alert and appears to be in no respiratory distress currently. Pt has px and discharge paperwork from [facility]. Pt denies using drugs since leaving hospital.

## 2022-09-05 NOTE — ED Provider Notes (Signed)
[facility] EMERGENCY DEPARTMENT Provider Note   CSN: [id number] Arrival date & time: 09/05/22  0414     History  Chief Complaint  Patient presents with   Shortness of Breath    Michael [name] is a 71 y.o. male.  The history is provided by the patient and medical records.  Shortness of Breath Michael [name] is a 71 y.o. male who presents to the Emergency Department complaining of sob.  He presents to the ED for evaluation of chest pain and sob.  He was discharged from the hospital in [place], [place] and arrived back in [place] around 2 am via greyhound bus.  Patient brings in discharge papers to the bedside with an admission date of November 18 and a discharge date of November 29.  Admission papers and hospital course are not available at time of initial ED assessment.  He does have prescriptions for amlodipine 5 mg, Levemir, clonidine twice daily, Lipitor, ASA.  He states he had a blood transfusion while in the hospital.  He is not able to provide additional information about what transpired during his hospital stay.  His reason for presenting to this emergency department for evaluation after he got off the bus was for his chronic chest pain that he has had since his CABG as well as chronic shortness of breath.  He states his breathing might be a little bit worse than his baseline.  No reports of black or bloody stools.    No tobacco, alcohol. Was using crack cocaine - drug free for one month.      Home Medications Prior to Admission medications   Medication Sig Start Date End Date Taking? Authorizing Provider  albuterol (VENTOLIN HFA) 108 (90 Base) MCG/ACT inhaler Inhale 1-2 puffs into the lungs every 6 (six) hours as needed for wheezing or shortness of breath. 10/24/21   [name], MD  amLODipine (NORVASC) 10 MG tablet Take 1 tablet (10 mg total) by mouth daily. 07/05/22 07/05/23  Nooruddin, Marlene Lard, MD  apixaban (ELIQUIS) 5 MG TABS tablet Take 1 tablet  (5 mg total) by mouth 2 (two) times daily. 07/05/22   Nooruddin, Marlene Lard, MD  aspirin EC 81 MG tablet Take 81 mg by mouth daily. Swallow whole.    [provider]  atorvastatin (LIPITOR) 40 MG tablet Take 40 mg by mouth daily. 06/27/22   [provider]  budesonide-formoterol (SYMBICORT) 160-4.5 MCG/ACT inhaler Inhale 2 puffs into the lungs in the morning and at bedtime. Patient not taking: Reported on 07/02/2022 10/24/21   [name], MD  buPROPion (WELLBUTRIN XL) 150 MG 24 hr tablet Take 1 tablet (150 mg total) by mouth every morning. Patient not taking: Reported on 07/02/2022 10/24/21   [name], MD  clopidogrel (PLAVIX) 75 MG tablet Take 1 tablet (75 mg total) by mouth daily. 07/06/22   Nooruddin, Marlene Lard, MD  diltiazem (CARDIZEM CD) 120 MG 24 hr capsule Take 120 mg by mouth 2 (two) times daily. 06/28/22   [provider]  dolutegravir-rilpivirine (JULUCA) 50-25 MG tablet Take 1 tablet by mouth daily before lunch. 07/06/22   Nooruddin, Marlene Lard, MD  FLUoxetine (PROZAC) 20 MG capsule Take 20 mg by mouth daily. 06/27/22   [provider]  gabapentin (NEURONTIN) 300 MG capsule Take 1 capsule (300 mg total) by mouth at bedtime. 07/05/22   Nooruddin, Marlene Lard, MD  INCRUSE ELLIPTA 62.5 MCG/ACT AEPB Inhale 1 puff into the lungs daily. 06/27/22   [provider]  insulin glargine (  LANTUS SOLOSTAR) 100 UNIT/ML Solostar Pen Inject 6 Units into the skin daily. 07/05/22   Nooruddin, Marlene Lard, MD  insulin lispro (HUMALOG KWIKPEN) 100 UNIT/ML KwikPen Inject 4 Units into the skin 3 (three) times daily with meals. 10/24/21   [name], MD  Insulin Pen Needle 31G X 5 MM MISC Use with insulin pens Patient taking differently: 1 each by Other route See admin instructions. Use with insulin pens 10/24/21   [name], MD  nitroGLYCERIN (NITROSTAT) 0.4 MG SL tablet Place 1 tablet (0.4 mg total) under the tongue every 5 (five) minutes as needed for chest pain. 10/24/21    [name], MD  pantoprazole (PROTONIX) 40 MG tablet Take 1 tablet (40 mg total) by mouth daily as needed (for heartburn). 10/24/21   [name], MD  Semaglutide,0.25 or 0.'5MG'$ /DOS, 2 MG/3ML SOPN Inject 0.5 mg into the skin once a week. 07/05/22   Nooruddin, Marlene Lard, MD  sevelamer carbonate (RENVELA) 800 MG tablet Take 800 mg by mouth 3 (three) times daily. 06/27/22   [provider]  traZODone (DESYREL) 100 MG tablet Take 100 mg by mouth at bedtime.    [provider]      Allergies    Patient has no known allergies.    Review of Systems   Review of Systems  Respiratory:  Positive for shortness of breath.   All other systems reviewed and are negative.   Physical Exam Updated Vital Signs BP (!) 144/62   Pulse 67   Temp 98 F (36.7 C)   Resp 18   SpO2 100%  Physical Exam Vitals and nursing note reviewed.  Constitutional:      Appearance: He is well-developed.  HENT:     Head: Normocephalic and atraumatic.  Cardiovascular:     Rate and Rhythm: Normal rate and regular rhythm.     Heart sounds: No murmur heard. Pulmonary:     Effort: Pulmonary effort is normal. No respiratory distress.     Breath sounds: Normal breath sounds.  Abdominal:     Palpations: Abdomen is soft.     Tenderness: There is no abdominal tenderness. There is no guarding or rebound.  Musculoskeletal:        General: No swelling or tenderness.  Skin:    General: Skin is warm and dry.  Neurological:     Mental Status: He is alert and oriented to person, place, and time.  Psychiatric:        Behavior: Behavior normal.     ED Results / Procedures / Treatments   Labs (all labs ordered are listed, but only abnormal results are displayed) Labs Reviewed  COMPREHENSIVE METABOLIC PANEL - Abnormal; Notable for the following components:      Result Value   CO2 16 (*)    Glucose, Bld 197 (*)    BUN 39 (*)    Creatinine, Ser 2.65 (*)    Calcium 8.7 (*)    ALT 45 (*)    GFR,  Estimated 25 (*)    All other components within normal limits  CBC WITH DIFFERENTIAL/PLATELET - Abnormal; Notable for the following components:   RBC 3.50 (*)    Hemoglobin 9.7 (*)    HCT 30.6 (*)    Abs Immature Granulocytes 0.15 (*)    All other components within normal limits  TROPONIN I (HIGH SENSITIVITY) - Abnormal; Notable for the following components:   Troponin I (High Sensitivity) 24 (*)    All other components within normal  limits  TROPONIN I (HIGH SENSITIVITY) - Abnormal; Notable for the following components:   Troponin I (High Sensitivity) 36 (*)    All other components within normal limits    EKG None  Radiology DG Chest 2 View  Result Date: 09/05/2022 CLINICAL DATA:  Chest pain and shortness of breath. EXAM: CHEST - 2 VIEW COMPARISON:  August 10, 2022 FINDINGS: Multiple sternal wires are noted. The heart size and mediastinal contours are within normal limits. A left atrial appendage clip is noted. Both lungs are clear. A radiopaque fusion plate and screws are seen overlying the cervical spine. The visualized skeletal structures are unremarkable. IMPRESSION: No active cardiopulmonary disease. Electronically Signed   By: Virgina Norfolk M.D.   On: 09/05/2022 04:54    Procedures Procedures    Medications Ordered in ED Medications - No data to display  ED Course/ Medical Decision Making/ A&P                           Medical Decision Making Amount and/or Complexity of Data Reviewed Labs: ordered. Radiology: ordered.   Patient with history of CKD, hypertension, coronary artery disease here for evaluation of what patient describes as chronic chest pain and shortness of breath.  He is well-appearing on evaluation with no respiratory distress, lungs are clear on evaluation.  EKG is unchanged when compared to priors.  BMP with stable renal function.  CBC with stable anemia.  He has a mildly elevated and slightly uptrending troponin.  He recently had a lengthy  hospitalization in [place] are not available through care everywhere.  Records from this hospitalization were requested.  Patient care transferred pending availability of patient's prior medical records as well as repeat troponin.        Final Clinical Impression(s) / ED Diagnoses Final diagnoses:  None    Rx / DC Orders ED Discharge Orders     None         Quintella Reichert, MD 09/05/22 [phone]

## 2022-09-05 NOTE — H&P (Addendum)
History and Physical    Michael [name] KDT:[id number] DOB: 1951/07/30 DOA: 09/05/2022  PCP: Michael Bogus., MD (Confirm with patient/family/NH records and if not entered, this has to be entered at [facility] point of entry) Patient coming from: Home  I have personally briefly reviewed patient's old medical records in [place]  Chief Complaint: Chest pain  HPI: Michael [name] is a 71 y.o. male with medical history significant of CAD status post CABG and stenting, cocaine abuse, HTN, chronic HFrEF with recovered LVEF 60-65% 2023, PAF on Eliquis, IDDM, CKD stage IIIB,  HIV on HAART, mild intermittent asthma, presented with chest pain.  Patient claimed that he has been having similar type of chest pain every day recently.  2 weeks ago he was admitted to local hospital in [place] for treatment of " atypical pneumonia" when he first developed chest pain, he was treated with nitroglycerin and symptoms subsided.  He also claimed that he also developed anemia and was given PRBC x 1, but there was no inpatient GI workup.  He said his stool color appears to be darker.  Last night, patient started to help episode of chest pains, localized on left upper chest, radiating to left upper arm, squeezing-like, 6-7/10, worsening with activity, relieved with rest for 5 to 10 minutes.  Denies any associated symptoms such as lightheadedness nauseous vomiting sweating.  Appears that patient has had multiple such episodes of chest pain in 2020 11-2021 and at side multiple ED visit.  No recent stress test.  When asked about cocaine usage, patient initially denied any recent dosage in 2 months however when confronted with recent positive cocaine test 3 weeks ago, patient admitted continued to use cocaine every other day.  ED Course:  Blood pressure elevated SBP> 150, afebrile, none tachycardia, not hypoxic.  Chest x-ray negative for CHF picture.  EKG no significant ST changes.  Troponin trending 24>  36>56>   Review of Systems: As per HPI otherwise 14 point review of systems negative.    Past Medical History:  Diagnosis Date   A-fib ([place])    Asthma    No PFTs, history of childhood asthma   CAD (coronary artery disease)    a. 06/2013 STEMI/PCI ([facility]): LAD w/ thrombus (treated with BMS), mid 75%, D2 75%; LCX OM2 75%; RCA small, PDA 95%, PLV 95%;  b. 10/2013 Cath/PCI: ISR w/in LAD (Promus DES x 2), borderline OM2 lesion;  c. 01/2014 MV: Intermediate risk, medium-sized distal ant wall infarct w/ very small amt of peri-infarct ischemia. EF 60%.   Cellulitis 04/2014   left facial   Cellulitis and abscess of toe of right foot 12/08/2019   Chondromalacia of medial femoral condyle    Left knee MRI 04/28/12: Chondromalacia of the medial femoral condyle with slight peripheral degeneration of the meniscocapsular junction of the medial meniscus; followed by sports medicine   Collagen vascular disease ([place])    Crack cocaine use    for 20+ years, has been enrolled in detox programs in the past   Depression    with history of hospitalization for suicidal ideation   Diabetes mellitus 2002   Diagnosed in 2002, started insulin in 2012   Gout    Gout 04/28/2012   Headache(784.0)    CT head 08/2011: Periventricular and subcortical white matter hypodensities are most in keeping with chronic microangiopathic change   HIV infection [facility]) Nov 2012   Followed by Michael [name]   Hyperlipidemia    Hypertension  Pulmonary embolism [facility])     Past Surgical History:  Procedure Laterality Date   AMPUTATION Right 07/21/2019   Procedure: RIGHT SECOND TOE AMPUTATION;  Surgeon: Michael Minion, MD;  Location: [facility];  Service: Orthopedics;  Laterality: Right;   BACK SURGERY     1988   BOWEL RESECTION     CARDIAC SURGERY     CERVICAL SPINE SURGERY     " rods in my neck "   CORONARY ARTERY BYPASS GRAFT     CORONARY STENT PLACEMENT     NM MYOCAR PERF WALL MOTION  12/27/2011   normal   SPINE SURGERY        reports that he has never smoked. He has never used smokeless tobacco. He reports that he does not currently use drugs after having used the following drugs: "Crack" cocaine and Cocaine. Frequency: 4.00 times per week. He reports that he does not drink alcohol.  No Known Allergies  Family History  Problem Relation Age of Onset   Diabetes Mother    Hypertension Mother    Hyperlipidemia Mother    Diabetes Father    Cancer Father    Hypertension Father    Diabetes Brother    Heart disease Brother    Diabetes Sister    Colon cancer Neg Hx      Prior to Admission medications   Medication Sig Start Date End Date Taking? Authorizing Provider  albuterol (VENTOLIN HFA) 108 (90 Base) MCG/ACT inhaler Inhale 1-2 puffs into the lungs every 6 (six) hours as needed for wheezing or shortness of breath. 10/24/21   Michael Stain, MD  amLODipine (NORVASC) 10 MG tablet Take 1 tablet (10 mg total) by mouth daily. 07/05/22 07/05/23  Nooruddin, Marlene Lard, MD  apixaban (ELIQUIS) 5 MG TABS tablet Take 1 tablet (5 mg total) by mouth 2 (two) times daily. 07/05/22   Nooruddin, Marlene Lard, MD  aspirin EC 81 MG tablet Take 81 mg by mouth daily. Swallow whole.    [provider]  atorvastatin (LIPITOR) 40 MG tablet Take 40 mg by mouth daily. 06/27/22   [provider]  budesonide-formoterol (SYMBICORT) 160-4.5 MCG/ACT inhaler Inhale 2 puffs into the lungs in the morning and at bedtime. Patient not taking: Reported on 07/02/2022 10/24/21   Michael Stain, MD  buPROPion (WELLBUTRIN XL) 150 MG 24 hr tablet Take 1 tablet (150 mg total) by mouth every morning. Patient not taking: Reported on 07/02/2022 10/24/21   Michael Stain, MD  clopidogrel (PLAVIX) 75 MG tablet Take 1 tablet (75 mg total) by mouth daily. 07/06/22   Nooruddin, Marlene Lard, MD  diltiazem (CARDIZEM CD) 120 MG 24 hr capsule Take 120 mg by mouth 2 (two) times daily. 06/28/22   [provider]  dolutegravir-rilpivirine (JULUCA) 50-25 MG tablet  Take 1 tablet by mouth daily before lunch. 07/06/22   Nooruddin, Marlene Lard, MD  FLUoxetine (PROZAC) 20 MG capsule Take 20 mg by mouth daily. 06/27/22   [provider]  gabapentin (NEURONTIN) 300 MG capsule Take 1 capsule (300 mg total) by mouth at bedtime. 07/05/22   Nooruddin, Marlene Lard, MD  INCRUSE ELLIPTA 62.5 MCG/ACT AEPB Inhale 1 puff into the lungs daily. 06/27/22   [provider]  insulin glargine (LANTUS SOLOSTAR) 100 UNIT/ML Solostar Pen Inject 6 Units into the skin daily. 07/05/22   Nooruddin, Marlene Lard, MD  insulin lispro (HUMALOG KWIKPEN) 100 UNIT/ML KwikPen Inject 4 Units into the skin 3 (three) times daily with meals. 10/24/21   Michael Stain, MD  Insulin Pen Needle 31G X 5 MM MISC Use with insulin pens Patient taking differently: 1 each by Other route See admin instructions. Use with insulin pens 10/24/21   Michael Stain, MD  nitroGLYCERIN (NITROSTAT) 0.4 MG SL tablet Place 1 tablet (0.4 mg total) under the tongue every 5 (five) minutes as needed for chest pain. 10/24/21   Michael Stain, MD  pantoprazole (PROTONIX) 40 MG tablet Take 1 tablet (40 mg total) by mouth daily as needed (for heartburn). 10/24/21   Michael Stain, MD  Semaglutide,0.25 or 0.'5MG'$ /DOS, 2 MG/3ML SOPN Inject 0.5 mg into the skin once a week. 07/05/22   Nooruddin, Marlene Lard, MD  sevelamer carbonate (RENVELA) 800 MG tablet Take 800 mg by mouth 3 (three) times daily. 06/27/22   [provider]  traZODone (DESYREL) 100 MG tablet Take 100 mg by mouth at bedtime.    [provider]    Physical Exam: Vitals:   09/05/22 1200 09/05/22 1215 09/05/22 1230 09/05/22 1330  BP: (!) 163/84 (!) 174/85 (!) 140/80 (!) 157/81  Pulse: 60 61 (!) 126 62  Resp: 10 10 (!) 21 14  Temp:      TempSrc:      SpO2: 100% 100% 100% 100%    Constitutional: NAD, calm, comfortable Vitals:   09/05/22 1200 09/05/22 1215 09/05/22 1230 09/05/22 1330  BP: (!) 163/84 (!) 174/85 (!) 140/80 (!) 157/81  Pulse: 60 61 (!) 126  62  Resp: 10 10 (!) 21 14  Temp:      TempSrc:      SpO2: 100% 100% 100% 100%   Eyes: PERRL, lids and conjunctivae normal ENMT: Mucous membranes are moist. Posterior pharynx clear of any exudate or lesions.Normal dentition.  Neck: normal, supple, no masses, no thyromegaly Respiratory: clear to auscultation bilaterally, no wheezing, no crackles. Normal respiratory effort. No accessory muscle use.  Cardiovascular: Regular rate and rhythm, no murmurs / rubs / gallops. No extremity edema. 2+ pedal pulses. No carotid bruits.  Tenderness of left upper forearm chest Abdomen: no tenderness, no masses palpated. No hepatosplenomegaly. Bowel sounds positive.  Musculoskeletal: no clubbing / cyanosis. No joint deformity upper and lower extremities. Good ROM, no contractures. Normal muscle tone.  Skin: no rashes, lesions, ulcers. No induration Neurologic: CN 2-12 grossly intact. Sensation intact, DTR normal. Strength 5/5 in all 4.  Psychiatric: Normal judgment and insight. Alert and oriented x 3. Normal mood.   (Anything < 9 systems with 2 bullets each down codes to level 1) (If patient refuses exam can't bill higher level) (Make sure to document decubitus ulcers present on admission -- if possible -- and whether patient has chronic indwelling catheter at time of admission)  Labs on Admission: I have personally reviewed following labs and imaging studies  CBC: Recent Labs  Lab 09/05/22 0505  WBC 7.1  NEUTROABS 5.3  HGB 9.7*  HCT 30.6*  MCV 87.4  PLT 161   Basic Metabolic Panel: Recent Labs  Lab 09/05/22 0505  NA 140  K 4.9  CL 111  CO2 16*  GLUCOSE 197*  BUN 39*  CREATININE 2.65*  CALCIUM 8.7*   GFR: CrCl cannot be calculated (Unknown ideal weight.). Liver Function Tests: Recent Labs  Lab 09/05/22 0505  AST 27  ALT 45*  ALKPHOS 81  BILITOT 0.3  PROT 7.1  ALBUMIN 3.6   No results for input(s): "LIPASE", "AMYLASE" in the last 168 hours. No results for input(s): "AMMONIA"  in the last 168 hours. Coagulation Profile: No results  for input(s): "INR", "PROTIME" in the last 168 hours. Cardiac Enzymes: No results for input(s): "CKTOTAL", "CKMB", "CKMBINDEX", "TROPONINI" in the last 168 hours. BNP (last 3 results) No results for input(s): "PROBNP" in the last 8760 hours. HbA1C: No results for input(s): "HGBA1C" in the last 72 hours. CBG: No results for input(s): "GLUCAP" in the last 168 hours. Lipid Profile: No results for input(s): "CHOL", "HDL", "LDLCALC", "TRIG", "CHOLHDL", "LDLDIRECT" in the last 72 hours. Thyroid Function Tests: No results for input(s): "TSH", "T4TOTAL", "FREET4", "T3FREE", "THYROIDAB" in the last 72 hours. Anemia Panel: No results for input(s): "VITAMINB12", "FOLATE", "FERRITIN", "TIBC", "IRON", "RETICCTPCT" in the last 72 hours. Urine analysis:    Component Value Date/Time   COLORURINE YELLOW 07/12/2022 1233   APPEARANCEUR CLEAR 07/12/2022 1233   LABSPEC 1.014 07/12/2022 1233   PHURINE 5.0 07/12/2022 1233   GLUCOSEU NEGATIVE 07/12/2022 1233   HGBUR MODERATE (A) 07/12/2022 1233   BILIRUBINUR NEGATIVE 07/12/2022 1233   KETONESUR NEGATIVE 07/12/2022 1233   PROTEINUR 100 (A) 07/12/2022 1233   UROBILINOGEN 0.2 08/13/2015 1030   NITRITE NEGATIVE 07/12/2022 1233   LEUKOCYTESUR NEGATIVE 07/12/2022 1233    Radiological Exams on Admission: DG Chest 2 View  Result Date: 09/05/2022 CLINICAL DATA:  Chest pain and shortness of breath. EXAM: CHEST - 2 VIEW COMPARISON:  August 10, 2022 FINDINGS: Multiple sternal wires are noted. The heart size and mediastinal contours are within normal limits. A left atrial appendage clip is noted. Both lungs are clear. A radiopaque fusion plate and screws are seen overlying the cervical spine. The visualized skeletal structures are unremarkable. IMPRESSION: No active cardiopulmonary disease. Electronically Signed   By: Virgina Norfolk M.D.   On: 09/05/2022 04:54    EKG: Independently reviewed.  Sinus, ST  depression in V5-V6  Assessment/Plan Active Problems:   3-vessel CAD   Cocaine use disorder, moderate, dependence ([name])   Chronic kidney disease, stage III (moderate) ([name])   Chest pain   Atypical chest pain  (please populate well all problems here in Problem List. (For example, if patient is on BP meds at home and you resume or decide to hold them, it is a problem that needs to be her. Same for CAD, COPD, HLD and so on)  Angina like chest pain -Likely related to cocaine abuse -Start Ativan every 6 hours x 2 days -Echocardiogram -Cardiology follows -Increase chest pain also has atypical features, has significant tenderness on left-sided chest.  Will not initiate heparin drip at this point, trial of lidocaine patch. -Continue aspirin Plavix and statin. -EKG changes on V5-6 probably not specific, with no significant reciprocal changes, will order repeat EKG and Trop   Normocytic anemia -Chronic, H&H stable, check iron study -Continue Eliquis for now, monitor H&H level. -No significant history of GI bleed, and patient is on combination of aspirin Plavix and Eliquis, recommend to follow-up with GI to stratify GI bleed risk, may consider reduced dual antiplatelet to mono therapy, but will defer the decision to PCP  IDDM, with hyperglycemia -Continue Lantus 60 units daily -Sliding scale, renal scale  HTN, uncontrolled -Continue amlodipine Cardizem -Will add imdur to address cocaine associated coronary artery spasm  HIV -HAART  Anxiety/depression -Continue SSRI  CKD stage IIIB -Cre stable, euvolemic, not on diuresis.  Chronic HFrEF with recovered LVEF -Euvolemic, not on diuresis, check TTE.   DVT prophylaxis: Eliquis  Code Status: Full code Family Communication: None at bedside Disposition Plan: Expect less than 2 midnight hospital stay Consults called: Cardiology Admission status: Tele observation  Lequita Halt MD [facility] Pager [phone]  09/05/2022, 1:45  PM

## 2022-09-05 NOTE — Progress Notes (Signed)
F/u EKG reviewed (obtained as first tracing had baseline wander). Shows NSR 73bpm, occasional PACs, early ST sloping particularly in V2-V4. This is a similar pattern going back to prior tracings including 06/2022. Reviewed with Dr. Acie Fredrickson. No change in plan. F/u EKG in AM to trend.

## 2022-09-05 NOTE — Progress Notes (Signed)
ANTICOAGULATION CONSULT NOTE - Initial Consult  Pharmacy Consult for Heparin infusion Indication: chest pain/ACS  No Known Allergies  Patient Measurements: Height: '5\' 8"'$  (172.7 cm) Weight: 72.6 kg (160 lb) IBW/kg (Calculated) : 68.4 Heparin Dosing Weight: 72.6 kg  Vital Signs: Temp: 98 F (36.7 C) (11/30 1623) Temp Source: Oral (11/30 1623) BP: 157/75 (11/30 1600) Pulse Rate: 64 (11/30 1600)  Labs: Recent Labs    09/05/22 0505 09/05/22 0610 09/05/22 0922  HGB 9.7*  --   --   HCT 30.6*  --   --   PLT 157  --   --   CREATININE 2.65*  --   --   TROPONINIHS 24* 36* 56*    Estimated Creatinine Clearance: 24.4 mL/min (A) (by C-G formula based on SCr of 2.65 mg/dL (H)).   Medical History: Past Medical History:  Diagnosis Date   A-fib ([place])    Anemia    Asthma    No PFTs, history of childhood asthma   CAD (coronary artery disease)    Cellulitis 04/2014   left facial   Cellulitis and abscess of toe of right foot 12/08/2019   Chondromalacia of medial femoral condyle    Left knee MRI 04/28/12: Chondromalacia of the medial femoral condyle with slight peripheral degeneration of the meniscocapsular junction of the medial meniscus; followed by sports medicine   Chronic kidney failure, stage 4 (severe) ([place])    Collagen vascular disease ([place])    Crack cocaine use    for 20+ years, has been enrolled in detox programs in the past   Depression    with history of hospitalization for suicidal ideation   Diabetes mellitus 2002   Diagnosed in 2002, started insulin in 2012   Gout    Headache(784.0)    CT head 08/2011: Periventricular and subcortical white matter hypodensities are most in keeping with chronic microangiopathic change   HIV infection ([place]) 08/2011   Followed by [name]   Hyperlipidemia    Hypertension    Pulmonary embolism ([place])     Medications:  Scheduled:   aspirin  324 mg Oral NOW   Or   aspirin  300 mg Rectal NOW   [START ON 09/06/2022] aspirin EC   81 mg Oral Daily   atorvastatin  40 mg Oral Daily   clopidogrel  75 mg Oral Daily   diltiazem  120 mg Oral Daily   [START ON 09/06/2022] dolutegravir-rilpivirine  1 tablet Oral QAC lunch   FLUoxetine  20 mg Oral Daily   gabapentin  300 mg Oral QHS   insulin aspart  0-9 Units Subcutaneous TID WC   insulin glargine-yfgn  6 Units Subcutaneous Daily   isosorbide mononitrate  15 mg Oral Daily   lidocaine  1 patch Transdermal Q24H   LORazepam  1 mg Intravenous Q6H   sevelamer carbonate  800 mg Oral TID   traZODone  100 mg Oral QHS   umeclidinium bromide  1 puff Inhalation Daily    Assessment: 71 yo M with PMH of CAD s/p CABG and PCI, cocaine abuse, HTN, chronic HFrEF w/ recovered EF 60-65% (2023), PAF on apixaban, IDDM, CKD stage 3b, HIV on HAART, asthma presented to ED with chest pain.  Has a history of taking apixaban for AFib but reports not taking any home meds in the past week or two. Pt is a poor historian. Pt's records from [place] facility to do not report apixaban administration. Pharmacy consulted to dose and manage heparin infusion as per ACS  protocol. OK to give bolus per Dr. Acie Fredrickson.  Hgb 9.7, Plt 157 Trop 36 > 56  Goal of Therapy:  Heparin level 0.3-0.7 units/ml Monitor platelets by anticoagulation protocol: Yes   Plan:  Give heparin 4000 unit IV bolus x1, then Initiated heparin infusion at 850 units/hr x 48hr Check heparin level in 8 hours Monitor daily CBC, heparin level, and for s/sx of bleeding.  Luisa Hart, PharmD, BCPS Clinical Pharmacist 09/05/2022 4:30 PM   Please refer to [facility] for pharmacy phone number

## 2022-09-06 ENCOUNTER — Other Ambulatory Visit (HOSPITAL_COMMUNITY): Payer: Self-pay

## 2022-09-06 DIAGNOSIS — R079 Chest pain, unspecified: Secondary | ICD-10-CM | POA: Diagnosis not present

## 2022-09-06 DIAGNOSIS — R7989 Other specified abnormal findings of blood chemistry: Secondary | ICD-10-CM | POA: Diagnosis not present

## 2022-09-06 LAB — CBC
HCT: 26.5 % — ABNORMAL LOW (ref 39.0–52.0)
Hemoglobin: 8.8 g/dL — ABNORMAL LOW (ref 13.0–17.0)
MCH: 27.7 pg (ref 26.0–34.0)
MCHC: 33.2 g/dL (ref 30.0–36.0)
MCV: 83.3 fL (ref 80.0–100.0)
Platelets: 148 10*3/uL — ABNORMAL LOW (ref 150–400)
RBC: 3.18 MIL/uL — ABNORMAL LOW (ref 4.22–5.81)
RDW: 13.9 % (ref 11.5–15.5)
WBC: 4.9 10*3/uL (ref 4.0–10.5)
nRBC: 0 % (ref 0.0–0.2)

## 2022-09-06 LAB — BASIC METABOLIC PANEL
Anion gap: 8 (ref 5–15)
BUN: 38 mg/dL — ABNORMAL HIGH (ref 8–23)
CO2: 22 mmol/L (ref 22–32)
Calcium: 8.6 mg/dL — ABNORMAL LOW (ref 8.9–10.3)
Chloride: 111 mmol/L (ref 98–111)
Creatinine, Ser: 2.51 mg/dL — ABNORMAL HIGH (ref 0.61–1.24)
GFR, Estimated: 27 mL/min — ABNORMAL LOW (ref >60)
Glucose, Bld: 93 mg/dL (ref 70–99)
Potassium: 4.5 mmol/L (ref 3.5–5.1)
Sodium: 141 mmol/L (ref 135–145)

## 2022-09-06 LAB — HEPARIN LEVEL (UNFRACTIONATED): Heparin Unfractionated: 0.31 IU/mL (ref 0.30–0.70)

## 2022-09-06 LAB — PHOSPHORUS: Phosphorus: 3.7 mg/dL (ref 2.5–4.6)

## 2022-09-06 MED ORDER — ACCU-CHEK GUIDE W/DEVICE KIT
PACK | 0 refills | Status: DC
  Filled 2022-09-06: qty 1, 30d supply, fill #0

## 2022-09-06 MED ORDER — BUDESONIDE-FORMOTEROL FUMARATE 160-4.5 MCG/ACT IN AERO
2.0000 | INHALATION_SPRAY | Freq: Two times a day (BID) | RESPIRATORY_TRACT | 2 refills | Status: DC
  Filled 2022-09-06: qty 10.2, 30d supply, fill #0

## 2022-09-06 MED ORDER — LANTUS SOLOSTAR 100 UNIT/ML ~~LOC~~ SOPN
6.0000 [IU] | PEN_INJECTOR | Freq: Every day | SUBCUTANEOUS | 1 refills | Status: DC
  Filled 2022-09-06: qty 6, 100d supply, fill #0

## 2022-09-06 MED ORDER — ALBUTEROL SULFATE HFA 108 (90 BASE) MCG/ACT IN AERS
1.0000 | INHALATION_SPRAY | Freq: Four times a day (QID) | RESPIRATORY_TRACT | 1 refills | Status: DC | PRN
  Filled 2022-09-06: qty 6.7, 30d supply, fill #0

## 2022-09-06 MED ORDER — LORAZEPAM 2 MG/ML IJ SOLN: 1.0000 mg | Freq: Four times a day (QID) | INTRAMUSCULAR | Status: DC | PRN

## 2022-09-06 MED ORDER — GLUCOSE BLOOD VI STRP
ORAL_STRIP | 0 refills | Status: DC
  Filled 2022-09-06: qty 100, 30d supply, fill #0

## 2022-09-06 MED ORDER — ISOSORBIDE MONONITRATE ER 30 MG PO TB24
15.0000 mg | ORAL_TABLET | Freq: Every day | ORAL | 1 refills | Status: DC
  Filled 2022-09-06: qty 15, 30d supply, fill #0

## 2022-09-06 MED ORDER — INSULIN PEN NEEDLE 32G X 4 MM MISC
0 refills | Status: DC
  Filled 2022-09-06: qty 100, 30d supply, fill #0

## 2022-09-06 MED ORDER — INCRUSE ELLIPTA 62.5 MCG/ACT IN AEPB
1.0000 | INHALATION_SPRAY | Freq: Every day | RESPIRATORY_TRACT | 1 refills | Status: DC
  Filled 2022-09-06: qty 30, 30d supply, fill #0

## 2022-09-06 MED ORDER — GABAPENTIN 300 MG PO CAPS
300.0000 mg | ORAL_CAPSULE | Freq: Every day | ORAL | 1 refills | Status: DC
  Filled 2022-09-06: qty 30, 30d supply, fill #0

## 2022-09-06 MED ORDER — ATORVASTATIN CALCIUM 40 MG PO TABS
40.0000 mg | ORAL_TABLET | Freq: Every day | ORAL | 1 refills | Status: DC
  Filled 2022-09-06: qty 30, 30d supply, fill #0

## 2022-09-06 MED ORDER — ASPIRIN 81 MG PO CHEW
81.0000 mg | CHEWABLE_TABLET | Freq: Every day | ORAL | 1 refills | Status: DC
  Filled 2022-09-06: qty 30, 30d supply, fill #0

## 2022-09-06 MED ORDER — SEVELAMER CARBONATE 800 MG PO TABS
800.0000 mg | ORAL_TABLET | Freq: Three times a day (TID) | ORAL | 1 refills | Status: DC
  Filled 2022-09-06: qty 90, 30d supply, fill #0

## 2022-09-06 MED ORDER — DOLUTEGRAVIR-RILPIVIRINE 50-25 MG PO TABS
1.0000 | ORAL_TABLET | Freq: Every day | ORAL | 1 refills | Status: DC
  Filled 2022-09-06: qty 30, 30d supply, fill #0

## 2022-09-06 MED ORDER — FLUOXETINE HCL 20 MG PO CAPS
20.0000 mg | ORAL_CAPSULE | Freq: Every day | ORAL | 1 refills | Status: DC
  Filled 2022-09-06: qty 30, 30d supply, fill #0

## 2022-09-06 MED ORDER — ACCU-CHEK SOFTCLIX LANCETS MISC
0 refills | Status: DC
  Filled 2022-09-06: qty 100, 30d supply, fill #0

## 2022-09-06 MED ORDER — DILTIAZEM HCL ER COATED BEADS 120 MG PO CP24
120.0000 mg | ORAL_CAPSULE | Freq: Every day | ORAL | 1 refills | Status: DC
  Filled 2022-09-06: qty 30, 30d supply, fill #0

## 2022-09-06 MED ORDER — CLOPIDOGREL BISULFATE 75 MG PO TABS
75.0000 mg | ORAL_TABLET | Freq: Every day | ORAL | 1 refills | Status: DC
  Filled 2022-09-06: qty 30, 30d supply, fill #0

## 2022-09-06 NOTE — Progress Notes (Signed)
09/06/22 0851  Oxygen Therapy  SpO2 98 %  O2 Device Room Air  Mobility  Activity Ambulated with assistance to bathroom  Level of Assistance Standby assist, set-up cues, supervision of patient - no hands on  Assistive Device None  Distance Ambulated (ft) 10 ft  Activity Response Tolerated well  Mobility Referral Yes  $Mobility charge 1 Mobility   Mobility Specialist Progress Note  Received pt in bathroom. Returned back to bed w/ all needs met and call bell in reach.   Lucious Groves Mobility Specialist  Please contact via SecureChat or Rehab office at [phone]

## 2022-09-06 NOTE — Progress Notes (Signed)
[place] NOTE  Pharmacy Consult for Heparin Indication: chest pain/ACS Brief A/P: Heparin level at low end of goal range Increase Heparin rate  No Known Allergies  Patient Measurements: Height: '5\' 8"'$  (172.7 cm) Weight: 70 kg (154 lb 5.2 oz) IBW/kg (Calculated) : 68.4 Heparin Dosing Weight: 72.6 kg  Vital Signs: Temp: 97.7 F (36.5 C) (12/01 0023) Temp Source: Oral (12/01 0023) BP: 124/75 (12/01 0023) Pulse Rate: 65 (12/01 0023)  Labs: Recent Labs    09/05/22 0505 09/05/22 0610 09/05/22 0922 09/05/22 2036 09/06/22 0040  HGB 9.7*  --   --  9.4* 8.8*  HCT 30.6*  --   --  29.5* 26.5*  PLT 157  --   --   --  148*  HEPARINUNFRC  --   --   --   --  0.31  CREATININE 2.65*  --   --   --   --   TROPONINIHS 24* 36* 56* 29*  --      Estimated Creatinine Clearance: 24.4 mL/min (A) (by C-G formula based on SCr of 2.65 mg/dL (H)).  Assessment: 71 y.o. male with chest pain for heparin   Goal of Therapy:  Heparin level 0.3-0.7 units/ml Monitor platelets by anticoagulation protocol: Yes   Plan:  Increase Heparin 950 units/hr Check heparin level in 8 hours.   Phillis Knack, PharmD, BCPS  09/06/2022 1:35 AM

## 2022-09-06 NOTE — TOC Progression Note (Signed)
Transition of Care [facility]) - Progression Note    Patient Details  Name: Michael [name] MRN: [id number] Date of Birth: Sep 01, 1951  Transition of Care [facility]) CM/SW Contact  Zenon Mayo, RN Phone Number: 09/06/2022, 11:48 AM  Clinical Narrative:    NCM gave patient [facility] and [place] emergency shelter programs, and PSA resources.  He will need a cab voucher to get to the [facility] from the hospital at dc today.  NCM will ast him with the cab voucher.         Expected Discharge Plan and Services           Expected Discharge Date: 09/06/22                                     Social Determinants of Health (SDOH) Interventions Housing Interventions: Intervention Not Indicated  Readmission Risk Interventions    07/05/2022   10:08 AM  Readmission Risk Prevention Plan  Transportation Screening Complete  Medication Review ([place]) Complete  PCP or Specialist appointment within 3-5 days of discharge Complete  HRI or [place] Complete  SW Recovery Care/Counseling Consult Complete  [place] Not Applicable

## 2022-09-06 NOTE — TOC Transition Note (Signed)
Transition of Care [facility]) - CM/SW Discharge Note   Patient Details  Name: Michael [name] MRN: [id number] Date of Birth: Oct 12, 1950  Transition of Care [facility]) CM/SW Contact:  Zenon Mayo, RN Phone Number: 09/06/2022, 11:55 AM   Clinical Narrative:     NCM gave patient [facility] and [place] emergency shelter programs, and PSA resources. He will need a cab voucher to get to the [facility] from the hospital at dc today. NCM will ast him with the cab voucher. Secretary has voucher for patient.         Patient Goals and CMS Choice        Discharge Placement                       Discharge Plan and Services                                     Social Determinants of Health (SDOH) Interventions Housing Interventions: Intervention Not Indicated   Readmission Risk Interventions    07/05/2022   10:08 AM  Readmission Risk Prevention Plan  Transportation Screening Complete  Medication Review ([place]) Complete  PCP or Specialist appointment within 3-5 days of discharge Complete  HRI or [place] Complete  SW Recovery Care/Counseling Consult Complete  [place] Not Applicable

## 2022-09-06 NOTE — Progress Notes (Addendum)
Progress Note  Patient Name: Michael [name] Date of Encounter: 09/06/2022  Primary Cardiologist: [facility]   Subjective   Chest pain improving. Still feels weak. Asking if he can leave to go to the [facility] today then come back to the hospital.  Troponins are minimally elevated.   Have peaked at 31 and trending down   Has CKD  .  Creatinine is 2.5  Plan is for medical management    Inpatient Medications    Scheduled Meds:  aspirin EC  81 mg Oral Daily   atorvastatin  40 mg Oral Daily   clopidogrel  75 mg Oral Daily   diltiazem  120 mg Oral Daily   dolutegravir-rilpivirine  1 tablet Oral QAC lunch   FLUoxetine  20 mg Oral Daily   gabapentin  300 mg Oral QHS   insulin aspart  0-9 Units Subcutaneous TID WC   insulin glargine-yfgn  6 Units Subcutaneous Daily   isosorbide mononitrate  15 mg Oral Daily   lidocaine  1 patch Transdermal Q24H   sevelamer carbonate  800 mg Oral TID with meals   traZODone  100 mg Oral QHS   umeclidinium bromide  1 puff Inhalation Daily   Continuous Infusions:  heparin 950 Units/hr (09/06/22 0231)   PRN Meds: acetaminophen, albuterol, LORazepam, nitroGLYCERIN, ondansetron (ZOFRAN) IV, pantoprazole   Vital Signs    Vitals:   09/06/22 0100 09/06/22 0200 09/06/22 0505 09/06/22 0851  BP:   (!) 150/86   Pulse:   61   Resp: '12 14 12   '$ Temp:   98 F (36.7 C)   TempSrc:   Oral   SpO2:   100% 98%  Weight:   69.6 kg   Height:        Intake/Output Summary (Last 24 hours) at 09/06/2022 0949 Last data filed at 09/06/2022 0231 Gross per 24 hour  Intake 102.73 ml  Output --  Net 102.73 ml      09/06/2022    5:05 AM 09/05/2022    7:49 PM 09/05/2022    4:18 PM  Last 3 Weights  Weight (lbs) 153 lb 6.4 oz 154 lb 5.2 oz 160 lb  Weight (kg) 69.582 kg 70 kg 72.576 kg     Telemetry    NSR - Personally Reviewed  ECG    No new tracings, asked nurse to notify when completed - Personally Reviewed  Physical Exam   GEN: No acute distress.   HEENT: Normocephalic, atraumatic, sclera non-icteric. Neck: No JVD or bruits. Cardiac: RRR no murmurs, rubs, or gallops.  Respiratory: Clear to auscultation bilaterally. Breathing is unlabored. GI: Soft, nontender, non-distended, BS +x 4. MS: no deformity. Extremities: No clubbing or cyanosis. No edema. Distal pedal pulses are 2+ and equal bilaterally. Neuro:  AAOx3. Follows commands. Psych:  Responds to questions appropriately with a normal affect.  Labs    High Sensitivity Troponin:   Recent Labs  Lab 08/10/22 1950 09/05/22 0505 09/05/22 0610 09/05/22 0922 09/05/22 2036  TROPONINIHS 13 24* 36* 56* 29*      Cardiac EnzymesNo results for input(s): "TROPONINI" in the last 168 hours. No results for input(s): "TROPIPOC" in the last 168 hours.   Chemistry Recent Labs  Lab 09/05/22 0505 09/06/22 0040  NA 140 141  K 4.9 4.5  CL 111 111  CO2 16* 22  GLUCOSE 197* 93  BUN 39* 38*  CREATININE 2.65* 2.51*  CALCIUM 8.7* 8.6*  PROT 7.1  --   ALBUMIN 3.6  --   AST 27  --  ALT 45*  --   ALKPHOS 81  --   BILITOT 0.3  --   GFRNONAA 25* 27*  ANIONGAP 13 8     Hematology Recent Labs  Lab 09/05/22 0505 09/05/22 2036 09/06/22 0040  WBC 7.1  --  4.9  RBC 3.50*  --  3.18*  HGB 9.7* 9.4* 8.8*  HCT 30.6* 29.5* 26.5*  MCV 87.4  --  83.3  MCH 27.7  --  27.7  MCHC 31.7  --  33.2  RDW 14.0  --  13.9  PLT 157  --  148*    BNPNo results for input(s): "BNP", "PROBNP" in the last 168 hours.   DDimer No results for input(s): "DDIMER" in the last 168 hours.   Radiology    ECHOCARDIOGRAM COMPLETE  Result Date: 09/05/2022    ECHOCARDIOGRAM REPORT   Patient Name:   BOBBYJOE PABST Date of Exam: 09/05/2022 Medical Rec #:  [id number]           Height:       68.0 in Accession #:    [id number]          Weight:       160.0 lb Date of Birth:  1951-05-07           BSA:          1.859 m Patient Age:    52 years            BP:           157/81 mmHg Patient Gender: M                    HR:           66 bpm. Exam Location:  Inpatient Procedure: 2D Echo and Strain Analysis Indications:    chest pain  History:        Patient has prior history of Echocardiogram examinations, most                 recent 05/04/2018. CAD; Risk Factors:Diabetes, Dyslipidemia and                 Hypertension.  Sonographer:    Harvie Junior Referring Phys: [id number] PING T [place]  Sonographer Comments: Technically difficult study due to poor echo windows and no subcostal window. Global longitudinal strain was attempted. IMPRESSIONS  1. Left ventricular ejection fraction, by estimation, is 55 to 60%. The left ventricle has normal function. The left ventricle has no regional wall motion abnormalities. Left ventricular diastolic parameters are indeterminate.  2. Right ventricular systolic function is normal. The right ventricular size is normal. Tricuspid regurgitation signal is inadequate for assessing PA pressure.  3. No evidence of mitral valve regurgitation.  4. The aortic valve is tricuspid. Aortic valve regurgitation is not visualized.  5. The inferior vena cava is normal in size with greater than 50% respiratory variability, suggesting right atrial pressure of 3 mmHg. FINDINGS  Left Ventricle: Left ventricular ejection fraction, by estimation, is 55 to 60%. The left ventricle has normal function. The left ventricle has no regional wall motion abnormalities. The left ventricular internal cavity size was normal in size. There is  no left ventricular hypertrophy. Left ventricular diastolic parameters are indeterminate. Right Ventricle: The right ventricular size is normal. Right ventricular systolic function is normal. Tricuspid regurgitation signal is inadequate for assessing PA pressure. Left Atrium: Left atrial size was normal in size. Right Atrium: Right atrial size was normal in size. Pericardium: There is  no evidence of pericardial effusion. Mitral Valve: No evidence of mitral valve regurgitation. Tricuspid Valve:  Tricuspid valve regurgitation is not demonstrated. Aortic Valve: The aortic valve is tricuspid. Aortic valve regurgitation is not visualized. Aortic valve mean gradient measures 4.0 mmHg. Aortic valve peak gradient measures 6.6 mmHg. Aortic valve area, by VTI measures 1.93 cm. Pulmonic Valve: Pulmonic valve regurgitation is not visualized. Aorta: The aortic root and ascending aorta are structurally normal, with no evidence of dilitation. Venous: The inferior vena cava is normal in size with greater than 50% respiratory variability, suggesting right atrial pressure of 3 mmHg. IAS/Shunts: The interatrial septum was not well visualized.  LEFT VENTRICLE PLAX 2D LVIDd:         4.70 cm      Diastology LVIDs:         3.80 cm      LV e' medial:    6.96 cm/s LV PW:         0.70 cm      LV E/e' medial:  13.4 LV IVS:        0.70 cm      LV e' lateral:   9.79 cm/s LVOT diam:     1.80 cm      LV E/e' lateral: 9.5 LV SV:         53 LV SV Index:   28           2D Longitudinal Strain LVOT Area:     2.54 cm     2D Strain GLS Avg:     -17.0 %  LV Volumes (MOD) LV vol d, MOD A2C: 57.5 ml LV vol d, MOD A4C: 101.0 ml LV vol s, MOD A2C: 38.9 ml LV vol s, MOD A4C: 52.0 ml LV SV MOD A2C:     18.6 ml LV SV MOD A4C:     101.0 ml LV SV MOD BP:      32.7 ml RIGHT VENTRICLE RV Basal diam:  3.50 cm RV Mid diam:    3.10 cm RV S prime:     8.70 cm/s TAPSE (M-mode): 1.4 cm LEFT ATRIUM           Index        RIGHT ATRIUM           Index LA diam:      2.90 cm 1.56 cm/m   RA Area:     12.30 cm LA Vol (A4C): 37.5 ml 20.17 ml/m  RA Volume:   27.90 ml  15.01 ml/m  AORTIC VALVE                    PULMONIC VALVE AV Area (Vmax):    1.99 cm     PV Vmax:          1.04 m/s AV Area (Vmean):   1.82 cm     PV Peak grad:     4.3 mmHg AV Area (VTI):     1.93 cm     PR End Diast Vel: 1.28 msec AV Vmax:           128.00 cm/s AV Vmean:          86.600 cm/s AV VTI:            0.274 m AV Peak Grad:      6.6 mmHg AV Mean Grad:      4.0 mmHg LVOT Vmax:          99.90 cm/s LVOT Vmean:  61.900 cm/s LVOT VTI:          0.208 m LVOT/AV VTI ratio: 0.76  AORTA Ao Root diam: 3.40 cm Ao Asc diam:  3.30 cm MITRAL VALVE MV Area (PHT): 4.68 cm    SHUNTS MV Decel Time: 162 msec    Systemic VTI:  0.21 m MR Peak grad: 48.1 mmHg    Systemic Diam: 1.80 cm MR Vmax:      346.80 cm/s MV E velocity: 93.30 cm/s MV A velocity: 67.90 cm/s MV E/A ratio:  1.37 Landscape architect signed by Phineas Inches Signature Date/Time: 09/05/2022/4:45:14 PM    Final    DG Chest 2 View  Result Date: 09/05/2022 CLINICAL DATA:  Chest pain and shortness of breath. EXAM: CHEST - 2 VIEW COMPARISON:  August 10, 2022 FINDINGS: Multiple sternal wires are noted. The heart size and mediastinal contours are within normal limits. A left atrial appendage clip is noted. Both lungs are clear. A radiopaque fusion plate and screws are seen overlying the cervical spine. The visualized skeletal structures are unremarkable. IMPRESSION: No active cardiopulmonary disease. Electronically Signed   By: Virgina Norfolk M.D.   On: 09/05/2022 04:54    Cardiac Studies   2D Echo 09/05/22    1. Left ventricular ejection fraction, by estimation, is 55 to 60%. The  left ventricle has normal function. The left ventricle has no regional  wall motion abnormalities. Left ventricular diastolic parameters are  indeterminate.   2. Right ventricular systolic function is normal. The right ventricular  size is normal. Tricuspid regurgitation signal is inadequate for assessing  PA pressure.   3. No evidence of mitral valve regurgitation.   4. The aortic valve is tricuspid. Aortic valve regurgitation is not  visualized.   5. The inferior vena cava is normal in size with greater than 50%  respiratory variability, suggesting right atrial pressure of 3 mmHg.    Patient Profile     71 y.o. male with CAD s/p prior PCIs and CABG as below, last PCI 2021, CKD stage 4, cocaine use, chronic HFrEF, HTN, HIV, DM2, PAF,  osteomyelitis s/p right second toe amputation 2020, depression with prior SI, homelessness, nonadherence to medication regimen, excessive encounters to various healthcare systems creating disjointed care most commonly for chest pain but also cocaine use, PNA, renal failure and suicidal ideation. Recently DC from outside facility in [facility], details unclear (? PNA, GIB) - patient denies having cardiac evaluation or stent during that admission. Returned to [employer] on [place] bus and came to ER for chest pain and weakness.  Assessment & Plan    1. Generalized weakness 2. CAD with complex hx of PCIs, CABG, here with continuous CP x 1 year and mildly elevated troponin, unclear if demand ischemia vs [place]/NSTEMI 3. CKD stage IV 4. Essential HTN 5. ? Reported history of PAF 6. Chronic HFrEF - improved EF here 7. Anemia with recent reported ?GIB s/p transfusion 8. IDDM 9. HIV  This remains a very difficult situation with the patient's disjointed care, nonadherence to outpatient regimen, lack of outpatient follow-up, complicated by history of substance abuse, thankfully with negative UDS here. It is difficult to piece together exactly what he truly should be taking. He's been on varying regimens at discharge from multiple facilities but reports he does not reliably take his medicines at home so it is like we are starting fresh each time. His chest pain has mixed typical/atypical features, worse with exertion, position changes, and tender to palpation. His troponin is mildly  elevated. UDS was negative. Given his nonadherence to medicine and follow-up as well as CKD and recent possible GIB, he is not a good candidate for invasive evaluation. His echo shows improved LV function from prior, therefore medical therapy seems appropriate. He was apparently supposed to be on Eliquis in the past but never took this. ASA and Plavix were also listed on med list. He is a poor candidate for triple therapy. His hemoglobin has  continued to downtrend. We restarted ASA, Plavix, heparin per pharmacy for the meantime yesterday along with diltiazem '120mg'$  once daily and Imdur '15mg'$  daily. Would avoid amlodipine since he is already on diltiazem. Can titrate up to '240mg'$  daily if needed for BP, but would avoid the previous BID dosing due to compliance concerns. Would avoid beta blocker with history of cocaine use. Atorvastatin has been restarted. I will review subsequent recommendations with MD. Patient reported yesterday he planned to follow with cardiology at [facility].    For questions or updates, please contact [place] Please consult [email or web address] for contact info under Cardiology/STEMI.  Signed, Charlie Pitter, PA-C 09/06/2022, 9:49 AM    Attending Note:   The patient was seen and examined.  Agree with assessment and plan as noted above.  Changes made to the above note as needed.  Patient seen and independently examined with Melina Copa, PA .   We discussed all aspects of the encounter. I agree with the assessment and plan as stated above.     Troponin elevation :   he is better.   As noted above, he has many challenges to adhering to his prescribed medical care.  He frequently runs out of his meds, stops meds,  etc.  Medical therapy would be the best option for him   He would like to [facility] up at [facility] for his cardiac care      I have spent a total of 40 minutes with patient reviewing hospital  notes , telemetry, EKGs, labs and examining patient as well as establishing an assessment and plan that was discussed with the patient.  > 50% of time was spent in direct patient care.    Thayer Headings, Brooke Bonito., MD, [facility] 09/06/2022, 11:14 AM [address],  [place] Pager [phone]

## 2022-09-06 NOTE — Discharge Summary (Addendum)
Physician Discharge Summary  Michael [name] MNO:[id number] DOB: [birth year] DOA: 09/05/2022  PCP: Michael [name]., MD  Admit date: 09/05/2022 Discharge date: 09/06/2022  Time spent: 40 minutes  Recommendations for Outpatient Follow-up:  Follow outpatient CBC/CMP  Needs cardiology follow up, plan at [facility] Eliquis discontinued Concern for possible recent GI bleed, needs w/u outpatient with gastroenterology Needs follow up with infectious disease to determine which antiretroviral he'll be started on  Follow insulin regimen diabetes Has had poor follow up and adherence with meds, needs close follow up in the future   Discharge Diagnoses:  Active Problems:   3-vessel CAD   Cocaine use disorder, moderate, dependence ([name])   Chronic kidney disease, stage III (moderate) ([name])   Chest pain   Atypical chest pain   Discharge Condition: stable  Diet recommendation: heart healthy, diabetic  Filed Weights   09/05/22 1618 09/05/22 1949 09/06/22 0505  Weight: 72.6 kg 70 kg 69.6 kg    History of present illness:   Michael [name] is Michael [name] 71 y.o. male with medical history significant of CAD status post CABG and stenting, cocaine abuse, HTN, chronic HFrEF with recovered LVEF 60-65% 2023, PAF on Eliquis, IDDM, CKD stage IIIB,  HIV on HAART, mild intermittent asthma, presented with chest pain.   Patient claimed that he has been having similar type of chest pain every day recently.  2 weeks ago he was admitted to local hospital in [place] for treatment of " atypical pneumonia" when he first developed chest pain, he was treated with nitroglycerin and symptoms subsided.  He also claimed that he also developed anemia and was given PRBC x 1, but there was no inpatient GI workup.  He said his stool color appears to be darker.  Last night, patient started to help episode of chest pains, localized on left upper chest, radiating to left upper arm, squeezing-like, 6-7/10, worsening with activity,  relieved with rest for 5 to 10 minutes.  Denies any associated symptoms such as lightheadedness nauseous vomiting sweating.  Appears that patient has had multiple such episodes of chest pain in 2020 11-2021 and at side multiple ED visit.  No recent stress test.  When asked about cocaine usage, patient initially denied any recent dosage in 2 months however when confronted with recent positive cocaine test 3 weeks ago, patient admitted continued to use cocaine every other day.   ED Course:  Blood pressure elevated SBP> 150, afebrile, none tachycardia, not hypoxic.  Chest x-ray negative for CHF picture.  EKG no significant ST changes.  Troponin trending 24> 36>56>   He was admitted for chest pain rule out.  Echo showed no regional WMA.  He was seen by cardiology.  They recommended medical management and outpatient follow up.  Issues with adherence and follow up.  Meds distributed by [facility] pharmacy prior to d/c.  Hospital Course:  Assessment and Plan: Chest Pain - negative utox - troponin minimally elevated - appreciate cardiology assistance - echo without WMA, EF 55% - will refill aspirin and plavix per cardiology.  Start imdur and continue diltiazem.  Restart atorvastatin.   Normocytic anemia -Chronic, H&H stable, check iron study (wnl) - follow and workup further outpatient   IDDM, with hyperglycemia -Continue Lantus 6 units daily, refill provided   HTN, uncontrolled -continue diltiazem, imdur   HIV -had planned to refill his antiretroviral therapy, but our pharmacy and several surrounding pharmacies did not have what he was taking in stock.  Discussed with our ID docs, they're planning  to arrange follow up to start him on something.   Anxiety/depression -Continue SSRI   CKD stage IIIB -Cre stable, euvolemic, not on diuresis.   Chronic HFrEF with recovered LVEF -Euvolemic, see echo above   History of Pulmonary Embolism - eliquis discontinued    Procedures: Echo IMPRESSIONS      1. Left ventricular ejection fraction, by estimation, is 55 to 60%. The  left ventricle has normal function. The left ventricle has no regional  wall motion abnormalities. Left ventricular diastolic parameters are  indeterminate.   2. Right ventricular systolic function is normal. The right ventricular  size is normal. Tricuspid regurgitation signal is inadequate for assessing  PA pressure.   3. No evidence of mitral valve regurgitation.   4. The aortic valve is tricuspid. Aortic valve regurgitation is not  visualized.   5. The inferior vena cava is normal in size with greater than 50%  respiratory variability, suggesting right atrial pressure of 3 mmHg.    Consultations: cardiology  Discharge Exam: Vitals:   09/06/22 0505 09/06/22 0851  BP: (!) 150/86   Pulse: 61   Resp: 12   Temp: 98 F (36.7 C)   SpO2: 100% 98%   Eager to get to the [facility] Wants to leave  General: No acute distress. Sitting up on edge of bed. Cardiovascular: RRR Lungs: unlabored Neurological: Alert and oriented 3. Moves all extremities 4 with equal strength. Cranial nerves II through XII grossly intact. Extremities: No clubbing or cyanosis. No edema.  Discharge Instructions   Discharge Instructions     Diet - low sodium heart healthy   Complete by: As directed    Discharge instructions   Complete by: As directed    You were seen with chest pain.  You've been seen by cardiology who is planning for an outpatient follow up for you for your chest pain.  One of the most important things is that you establish regular care with Michael [name] local provider.  This will allow someone to be able to know exactly what you're taking and adjust your regimen appropriately.    Please follow up with the cardiologist as recommended.  It sounds like you're planning to follow up at [facility].  It's extremely important that you reestablish with them.  I've refilled some of your medications here.  We're continuing the  aspirin and plavix.  We've stopped the eliquis at this time.  We've started some new blood pressure medicines.  Please review your updated med list for what is new and what we've stopped.  Follow up with infectious disease for your HIV.  I've refilled this medicine here.  I'm refilling your long acting insulin.  Follow up with your PCP outpatient to discuss whether you need additional medicines or your doses need to be changed.  Return for new, recurrent, or worsening symptoms.  Please ask your PCP to request records from this hospitalization so they know what was done and what the next steps will be.   Increase activity slowly   Complete by: As directed       Allergies as of 09/06/2022   No Known Allergies      Medication List     STOP taking these medications    amLODipine 10 MG tablet Commonly known as: NORVASC   amLODipine 5 MG tablet Commonly known as: NORVASC   buPROPion 150 MG 24 hr tablet Commonly known as: WELLBUTRIN XL   cloNIDine 0.1 MG tablet Commonly known as: CATAPRES   Eliquis 5 MG  Tabs tablet Generic drug: apixaban   HumaLOG KwikPen 100 UNIT/ML KwikPen Generic drug: insulin lispro   insulin aspart 100 UNIT/ML injection Commonly known as: novoLOG   insulin detemir 100 UNIT/ML FlexPen Commonly known as: LEVEMIR   pantoprazole 40 MG tablet Commonly known as: PROTONIX   Semaglutide(0.25 or 0.5MG/DOS) 2 MG/3ML Sopn   traZODone 100 MG tablet Commonly known as: DESYREL       TAKE these medications    albuterol 108 (90 Base) MCG/ACT inhaler Commonly known as: VENTOLIN HFA Inhale 1-2 puffs into the lungs every 6 (six) hours as needed for wheezing or shortness of breath.   aspirin 81 MG chewable tablet Chew 1 tablet (81 mg total) by mouth daily.   atorvastatin 40 MG tablet Commonly known as: LIPITOR Take 1 tablet (40 mg total) by mouth daily.   blood glucose meter kit and supplies Kit Dispense based on patient and insurance preference. Use  up to four times daily as directed.   budesonide-formoterol 160-4.5 MCG/ACT inhaler Commonly known as: SYMBICORT Inhale 2 puffs into the lungs in the morning and at bedtime.   clopidogrel 75 MG tablet Commonly known as: PLAVIX Take 1 tablet (75 mg total) by mouth daily.   diltiazem 120 MG 24 hr capsule Commonly known as: CARDIZEM CD Take 1 capsule (120 mg total) by mouth daily. Start taking on: September 07, 2022 What changed: when to take this   dolutegravir-rilpivirine 50-25 MG tablet Commonly known as: JULUCA Take 1 tablet by mouth daily before lunch.   FLUoxetine 20 MG capsule Commonly known as: PROZAC Take 1 capsule (20 mg total) by mouth daily.   gabapentin 300 MG capsule Commonly known as: NEURONTIN Take 1 capsule (300 mg total) by mouth at bedtime.   Incruse Ellipta 62.5 MCG/ACT Aepb Generic drug: umeclidinium bromide Inhale 1 puff into the lungs daily.   isosorbide mononitrate 30 MG 24 hr tablet Commonly known as: IMDUR Take 0.5 tablets (15 mg total) by mouth daily. Start taking on: September 07, 2022   Lantus SoloStar 100 UNIT/ML Solostar Pen Generic drug: insulin glargine Inject 6 Units into the skin daily.   nitroGLYCERIN 0.4 MG SL tablet Commonly known as: NITROSTAT Place 1 tablet (0.4 mg total) under the tongue every 5 (five) minutes as needed for chest pain.   sevelamer carbonate 800 MG tablet Commonly known as: RENVELA Take 1 tablet (800 mg total) by mouth 3 (three) times daily.   Unifine Pentips 31G X 5 MM Misc Generic drug: Insulin Pen Needle Use with insulin pens What changed:  how much to take how to take this when to take this       No Known Allergies    The results of significant diagnostics from this hospitalization (including imaging, microbiology, ancillary and laboratory) are listed below for reference.    Significant Diagnostic Studies: ECHOCARDIOGRAM COMPLETE  Result Date: 09/05/2022    ECHOCARDIOGRAM REPORT   Patient Name:    Michael [name] Date of Exam: 09/05/2022 Medical Rec #:  [id number]           Height:       68.0 in Accession #:    [id number]          Weight:       160.0 lb Date of Birth:  [birth year]           BSA:          1.859 m Patient Age:    71 years  BP:           157/81 mmHg Patient Gender: M                   HR:           66 bpm. Exam Location:  Inpatient Procedure: 2D Echo and Strain Analysis Indications:    chest pain  History:        Patient has prior history of Echocardiogram examinations, most                 recent 05/04/2018. CAD; Risk Factors:Diabetes, Dyslipidemia and                 Hypertension.  Sonographer:    Harvie Junior Referring Phys: [id number] PING T [place]  Sonographer Comments: Technically difficult study due to poor echo windows and no subcostal window. Global longitudinal strain was attempted. IMPRESSIONS  1. Left ventricular ejection fraction, by estimation, is 55 to 60%. The left ventricle has normal function. The left ventricle has no regional wall motion abnormalities. Left ventricular diastolic parameters are indeterminate.  2. Right ventricular systolic function is normal. The right ventricular size is normal. Tricuspid regurgitation signal is inadequate for assessing PA pressure.  3. No evidence of mitral valve regurgitation.  4. The aortic valve is tricuspid. Aortic valve regurgitation is not visualized.  5. The inferior vena cava is normal in size with greater than 50% respiratory variability, suggesting right atrial pressure of 3 mmHg. FINDINGS  Left Ventricle: Left ventricular ejection fraction, by estimation, is 55 to 60%. The left ventricle has normal function. The left ventricle has no regional wall motion abnormalities. The left ventricular internal cavity size was normal in size. There is  no left ventricular hypertrophy. Left ventricular diastolic parameters are indeterminate. Right Ventricle: The right ventricular size is normal. Right ventricular systolic function is  normal. Tricuspid regurgitation signal is inadequate for assessing PA pressure. Left Atrium: Left atrial size was normal in size. Right Atrium: Right atrial size was normal in size. Pericardium: There is no evidence of pericardial effusion. Mitral Valve: No evidence of mitral valve regurgitation. Tricuspid Valve: Tricuspid valve regurgitation is not demonstrated. Aortic Valve: The aortic valve is tricuspid. Aortic valve regurgitation is not visualized. Aortic valve mean gradient measures 4.0 mmHg. Aortic valve peak gradient measures 6.6 mmHg. Aortic valve area, by VTI measures 1.93 cm. Pulmonic Valve: Pulmonic valve regurgitation is not visualized. Aorta: The aortic root and ascending aorta are structurally normal, with no evidence of dilitation. Venous: The inferior vena cava is normal in size with greater than 50% respiratory variability, suggesting right atrial pressure of 3 mmHg. IAS/Shunts: The interatrial septum was not well visualized.  LEFT VENTRICLE PLAX 2D LVIDd:         4.70 cm      Diastology LVIDs:         3.80 cm      LV e' medial:    6.96 cm/s LV PW:         0.70 cm      LV E/e' medial:  13.4 LV IVS:        0.70 cm      LV e' lateral:   9.79 cm/s LVOT diam:     1.80 cm      LV E/e' lateral: 9.5 LV SV:         53 LV SV Index:   28           2D Longitudinal Strain LVOT Area:  2.54 cm     2D Strain GLS Avg:     -17.0 %  LV Volumes (MOD) LV vol d, MOD A2C: 57.5 ml LV vol d, MOD A4C: 101.0 ml LV vol s, MOD A2C: 38.9 ml LV vol s, MOD A4C: 52.0 ml LV SV MOD A2C:     18.6 ml LV SV MOD A4C:     101.0 ml LV SV MOD BP:      32.7 ml RIGHT VENTRICLE RV Basal diam:  3.50 cm RV Mid diam:    3.10 cm RV S prime:     8.70 cm/s TAPSE (M-mode): 1.4 cm LEFT ATRIUM           Index        RIGHT ATRIUM           Index LA diam:      2.90 cm 1.56 cm/m   RA Area:     12.30 cm LA Vol (A4C): 37.5 ml 20.17 ml/m  RA Volume:   27.90 ml  15.01 ml/m  AORTIC VALVE                    PULMONIC VALVE AV Area (Vmax):    1.99 cm      PV Vmax:          1.04 m/s AV Area (Vmean):   1.82 cm     PV Peak grad:     4.3 mmHg AV Area (VTI):     1.93 cm     PR End Diast Vel: 1.28 msec AV Vmax:           128.00 cm/s AV Vmean:          86.600 cm/s AV VTI:            0.274 m AV Peak Grad:      6.6 mmHg AV Mean Grad:      4.0 mmHg LVOT Vmax:         99.90 cm/s LVOT Vmean:        61.900 cm/s LVOT VTI:          0.208 m LVOT/AV VTI ratio: 0.76  AORTA Ao Root diam: 3.40 cm Ao Asc diam:  3.30 cm MITRAL VALVE MV Area (PHT): 4.68 cm    SHUNTS MV Decel Time: 162 msec    Systemic VTI:  0.21 m MR Peak grad: 48.1 mmHg    Systemic Diam: 1.80 cm MR Vmax:      346.80 cm/s MV E velocity: 93.30 cm/s MV Maverik Foot velocity: 67.90 cm/s MV E/Cleopha Indelicato ratio:  1.37 Landscape architect signed by Phineas Inches Signature Date/Time: 09/05/2022/4:45:14 PM    Final    DG Chest 2 View  Result Date: 09/05/2022 CLINICAL DATA:  Chest pain and shortness of breath. EXAM: CHEST - 2 VIEW COMPARISON:  August 10, 2022 FINDINGS: Multiple sternal wires are noted. The heart size and mediastinal contours are within normal limits. Robina Hamor left atrial appendage clip is noted. Both lungs are clear. Quaid Yeakle radiopaque fusion plate and screws are seen overlying the cervical spine. The visualized skeletal structures are unremarkable. IMPRESSION: No active cardiopulmonary disease. Electronically Signed   By: Virgina Norfolk M.D.   On: 09/05/2022 04:54   DG Chest Port 1 View  Result Date: 08/10/2022 CLINICAL DATA:  Chest pain and shortness of breath. EXAM: PORTABLE CHEST 1 VIEW COMPARISON:  08/10/2022 and prior studies FINDINGS: The cardiomediastinal silhouette is unchanged. Median sternotomy and LEFT atrial clip again noted. There is no  evidence of focal airspace disease, pulmonary edema, suspicious pulmonary nodule/mass, pleural effusion, or pneumothorax. No acute bony abnormalities are identified. Cervical fusion hardware again noted. IMPRESSION: No active disease. Electronically Signed   By: Margarette Canada M.D.    On: 08/10/2022 16:16   DG Chest Port 1 View  Result Date: 08/10/2022 CLINICAL DATA:  Left-sided chest pain. EXAM: PORTABLE CHEST 1 VIEW COMPARISON:  July 12, 2022 FINDINGS: Multiple sternal wires are noted. The heart size and mediastinal contours are within normal limits. Venia Riveron coronary artery stent is seen. Both lungs are clear. Antwion Carpenter radiopaque fusion plate and screws are seen overlying the cervical spine. The visualized skeletal structures are unremarkable. IMPRESSION: 1. Evidence of prior median and coronary artery stent placement. 2. No acute cardiopulmonary disease. Electronically Signed   By: Virgina Norfolk M.D.   On: 08/10/2022 04:34    Microbiology: No results found for this or any previous visit (from the past 240 hour(s)).   Labs: Basic Metabolic Panel: Recent Labs  Lab 09/05/22 0505 09/06/22 0040  NA 140 141  K 4.9 4.5  CL 111 111  CO2 16* 22  GLUCOSE 197* 93  BUN 39* 38*  CREATININE 2.65* 2.51*  CALCIUM 8.7* 8.6*  PHOS  --  3.7   Liver Function Tests: Recent Labs  Lab 09/05/22 0505  AST 27  ALT 45*  ALKPHOS 81  BILITOT 0.3  PROT 7.1  ALBUMIN 3.6   No results for input(s): "LIPASE", "AMYLASE" in the last 168 hours. No results for input(s): "AMMONIA" in the last 168 hours. CBC: Recent Labs  Lab 09/05/22 0505 09/05/22 2036 09/06/22 0040  WBC 7.1  --  4.9  NEUTROABS 5.3  --   --   HGB 9.7* 9.4* 8.8*  HCT 30.6* 29.5* 26.5*  MCV 87.4  --  83.3  PLT 157  --  148*   Cardiac Enzymes: No results for input(s): "CKTOTAL", "CKMB", "CKMBINDEX", "TROPONINI" in the last 168 hours. BNP: BNP (last 3 results) Recent Labs    07/02/22 0833  BNP 169.9*    ProBNP (last 3 results) No results for input(s): "PROBNP" in the last 8760 hours.  CBG: Recent Labs  Lab 09/05/22 1724  GLUCAP 236*       Signed:  Fayrene Helper MD.  [facility] 09/06/2022, 11:45 AM

## 2022-09-07 LAB — LIPOPROTEIN A (LPA): Lipoprotein (a): 70 nmol/L — ABNORMAL HIGH (ref <75.0)

## 2022-09-09 ENCOUNTER — Inpatient Hospital Stay (HOSPITAL_COMMUNITY)
Admission: EM | Admit: 2022-09-09 | Discharge: 2022-10-18 | DRG: 683 | Disposition: A | Discharge status: Home / Self Care | Payer: Medicare Other | Attending: Internal Medicine | Admitting: Internal Medicine

## 2022-09-09 ENCOUNTER — Observation Stay (HOSPITAL_COMMUNITY): Payer: Medicare Other

## 2022-09-09 ENCOUNTER — Encounter (HOSPITAL_COMMUNITY): Payer: Self-pay

## 2022-09-09 ENCOUNTER — Emergency Department (HOSPITAL_COMMUNITY): Payer: Medicare Other

## 2022-09-09 DIAGNOSIS — G8929 Other chronic pain: Secondary | ICD-10-CM | POA: Diagnosis present

## 2022-09-09 DIAGNOSIS — R45851 Suicidal ideations: Secondary | ICD-10-CM | POA: Diagnosis not present

## 2022-09-09 DIAGNOSIS — I2510 Atherosclerotic heart disease of native coronary artery without angina pectoris: Secondary | ICD-10-CM | POA: Diagnosis present

## 2022-09-09 DIAGNOSIS — M898X9 Other specified disorders of bone, unspecified site: Secondary | ICD-10-CM | POA: Diagnosis present

## 2022-09-09 DIAGNOSIS — Z91148 Patient's other noncompliance with medication regimen for other reason: Secondary | ICD-10-CM

## 2022-09-09 DIAGNOSIS — B20 Human immunodeficiency virus [HIV] disease: Secondary | ICD-10-CM | POA: Diagnosis present

## 2022-09-09 DIAGNOSIS — R109 Unspecified abdominal pain: Secondary | ICD-10-CM | POA: Diagnosis present

## 2022-09-09 DIAGNOSIS — Z955 Presence of coronary angioplasty implant and graft: Secondary | ICD-10-CM

## 2022-09-09 DIAGNOSIS — Z8249 Family history of ischemic heart disease and other diseases of the circulatory system: Secondary | ICD-10-CM

## 2022-09-09 DIAGNOSIS — I130 Hypertensive heart and chronic kidney disease with heart failure and stage 1 through stage 4 chronic kidney disease, or unspecified chronic kidney disease: Secondary | ICD-10-CM | POA: Diagnosis present

## 2022-09-09 DIAGNOSIS — M549 Dorsalgia, unspecified: Secondary | ICD-10-CM | POA: Diagnosis present

## 2022-09-09 DIAGNOSIS — I5032 Chronic diastolic (congestive) heart failure: Secondary | ICD-10-CM

## 2022-09-09 DIAGNOSIS — E785 Hyperlipidemia, unspecified: Secondary | ICD-10-CM | POA: Diagnosis present

## 2022-09-09 DIAGNOSIS — Z833 Family history of diabetes mellitus: Secondary | ICD-10-CM

## 2022-09-09 DIAGNOSIS — E119 Type 2 diabetes mellitus without complications: Secondary | ICD-10-CM | POA: Diagnosis present

## 2022-09-09 DIAGNOSIS — F1910 Other psychoactive substance abuse, uncomplicated: Secondary | ICD-10-CM | POA: Diagnosis present

## 2022-09-09 DIAGNOSIS — Z7901 Long term (current) use of anticoagulants: Secondary | ICD-10-CM

## 2022-09-09 DIAGNOSIS — I5042 Chronic combined systolic (congestive) and diastolic (congestive) heart failure: Secondary | ICD-10-CM | POA: Diagnosis present

## 2022-09-09 DIAGNOSIS — Z7982 Long term (current) use of aspirin: Secondary | ICD-10-CM

## 2022-09-09 DIAGNOSIS — N184 Chronic kidney disease, stage 4 (severe): Secondary | ICD-10-CM | POA: Diagnosis present

## 2022-09-09 DIAGNOSIS — R1084 Generalized abdominal pain: Secondary | ICD-10-CM

## 2022-09-09 DIAGNOSIS — I5030 Unspecified diastolic (congestive) heart failure: Secondary | ICD-10-CM | POA: Diagnosis present

## 2022-09-09 DIAGNOSIS — R7989 Other specified abnormal findings of blood chemistry: Secondary | ICD-10-CM | POA: Insufficient documentation

## 2022-09-09 DIAGNOSIS — I480 Paroxysmal atrial fibrillation: Secondary | ICD-10-CM | POA: Diagnosis present

## 2022-09-09 DIAGNOSIS — E1122 Type 2 diabetes mellitus with diabetic chronic kidney disease: Secondary | ICD-10-CM | POA: Diagnosis present

## 2022-09-09 DIAGNOSIS — Z83438 Family history of other disorder of lipoprotein metabolism and other lipidemia: Secondary | ICD-10-CM

## 2022-09-09 DIAGNOSIS — Z21 Asymptomatic human immunodeficiency virus [HIV] infection status: Secondary | ICD-10-CM | POA: Diagnosis present

## 2022-09-09 DIAGNOSIS — Z7902 Long term (current) use of antithrombotics/antiplatelets: Secondary | ICD-10-CM

## 2022-09-09 DIAGNOSIS — N179 Acute kidney failure, unspecified: Secondary | ICD-10-CM | POA: Diagnosis not present

## 2022-09-09 DIAGNOSIS — Z86711 Personal history of pulmonary embolism: Secondary | ICD-10-CM | POA: Diagnosis present

## 2022-09-09 DIAGNOSIS — Z7951 Long term (current) use of inhaled steroids: Secondary | ICD-10-CM

## 2022-09-09 DIAGNOSIS — D649 Anemia, unspecified: Secondary | ICD-10-CM

## 2022-09-09 DIAGNOSIS — Z6825 Body mass index (BMI) 25.0-25.9, adult: Secondary | ICD-10-CM

## 2022-09-09 DIAGNOSIS — E875 Hyperkalemia: Secondary | ICD-10-CM | POA: Diagnosis not present

## 2022-09-09 DIAGNOSIS — N178 Other acute kidney failure: Secondary | ICD-10-CM

## 2022-09-09 DIAGNOSIS — Z794 Long term (current) use of insulin: Secondary | ICD-10-CM

## 2022-09-09 DIAGNOSIS — Z89421 Acquired absence of other right toe(s): Secondary | ICD-10-CM

## 2022-09-09 DIAGNOSIS — D631 Anemia in chronic kidney disease: Secondary | ICD-10-CM | POA: Diagnosis present

## 2022-09-09 DIAGNOSIS — Z5900 Homelessness unspecified: Secondary | ICD-10-CM

## 2022-09-09 DIAGNOSIS — J449 Chronic obstructive pulmonary disease, unspecified: Secondary | ICD-10-CM | POA: Diagnosis present

## 2022-09-09 DIAGNOSIS — F1994 Other psychoactive substance use, unspecified with psychoactive substance-induced mood disorder: Secondary | ICD-10-CM | POA: Diagnosis present

## 2022-09-09 DIAGNOSIS — Z951 Presence of aortocoronary bypass graft: Secondary | ICD-10-CM

## 2022-09-09 DIAGNOSIS — E440 Moderate protein-calorie malnutrition: Secondary | ICD-10-CM | POA: Diagnosis present

## 2022-09-09 DIAGNOSIS — F1410 Cocaine abuse, uncomplicated: Secondary | ICD-10-CM | POA: Diagnosis present

## 2022-09-09 DIAGNOSIS — F329 Major depressive disorder, single episode, unspecified: Secondary | ICD-10-CM | POA: Diagnosis present

## 2022-09-09 DIAGNOSIS — K5900 Constipation, unspecified: Secondary | ICD-10-CM | POA: Diagnosis not present

## 2022-09-09 DIAGNOSIS — J4489 Other specified chronic obstructive pulmonary disease: Secondary | ICD-10-CM | POA: Diagnosis present

## 2022-09-09 DIAGNOSIS — E1165 Type 2 diabetes mellitus with hyperglycemia: Secondary | ICD-10-CM | POA: Diagnosis present

## 2022-09-09 DIAGNOSIS — Z79899 Other long term (current) drug therapy: Secondary | ICD-10-CM

## 2022-09-09 DIAGNOSIS — N1831 Chronic kidney disease, stage 3a: Principal | ICD-10-CM

## 2022-09-09 DIAGNOSIS — E1169 Type 2 diabetes mellitus with other specified complication: Secondary | ICD-10-CM | POA: Diagnosis present

## 2022-09-09 DIAGNOSIS — I10 Essential (primary) hypertension: Secondary | ICD-10-CM | POA: Diagnosis present

## 2022-09-09 DIAGNOSIS — M48061 Spinal stenosis, lumbar region without neurogenic claudication: Secondary | ICD-10-CM | POA: Diagnosis present

## 2022-09-09 DIAGNOSIS — Z7984 Long term (current) use of oral hypoglycemic drugs: Secondary | ICD-10-CM

## 2022-09-09 DIAGNOSIS — M109 Gout, unspecified: Secondary | ICD-10-CM | POA: Diagnosis present

## 2022-09-09 LAB — TROPONIN I (HIGH SENSITIVITY): Troponin I (High Sensitivity): 30 ng/L — ABNORMAL HIGH (ref <18)

## 2022-09-09 LAB — COMPREHENSIVE METABOLIC PANEL
ALT: 27 U/L (ref 0–44)
AST: 24 U/L (ref 15–41)
Albumin: 4 g/dL (ref 3.5–5.0)
Alkaline Phosphatase: 93 U/L (ref 38–126)
Anion gap: 13 (ref 5–15)
BUN: 81 mg/dL — ABNORMAL HIGH (ref 8–23)
CO2: 15 mmol/L — ABNORMAL LOW (ref 22–32)
Calcium: 9.1 mg/dL (ref 8.9–10.3)
Chloride: 107 mmol/L (ref 98–111)
Creatinine, Ser: 6.32 mg/dL — ABNORMAL HIGH (ref 0.61–1.24)
GFR, Estimated: 9 mL/min — ABNORMAL LOW (ref >60)
Glucose, Bld: 123 mg/dL — ABNORMAL HIGH (ref 70–99)
Potassium: 5.1 mmol/L (ref 3.5–5.1)
Sodium: 135 mmol/L (ref 135–145)
Total Bilirubin: 0.5 mg/dL (ref 0.3–1.2)
Total Protein: 7.8 g/dL (ref 6.5–8.1)

## 2022-09-09 LAB — URINALYSIS, ROUTINE W REFLEX MICROSCOPIC
Bilirubin Urine: NEGATIVE
Glucose, UA: NEGATIVE mg/dL
Ketones, ur: NEGATIVE mg/dL
Leukocytes,Ua: NEGATIVE
Nitrite: NEGATIVE
Protein, ur: 100 mg/dL — AB
Specific Gravity, Urine: 1.013 (ref 1.005–1.030)
pH: 5 (ref 5.0–8.0)

## 2022-09-09 LAB — ACETAMINOPHEN LEVEL: Acetaminophen (Tylenol), Serum: <10 ug/mL — ABNORMAL LOW (ref 10–30)

## 2022-09-09 LAB — RAPID URINE DRUG SCREEN, HOSP PERFORMED
Amphetamines: NOT DETECTED
Barbiturates: NOT DETECTED
Benzodiazepines: NOT DETECTED
Cocaine: POSITIVE — AB
Opiates: NOT DETECTED
Tetrahydrocannabinol: NOT DETECTED

## 2022-09-09 LAB — CBC
HCT: 31.3 % — ABNORMAL LOW (ref 39.0–52.0)
Hemoglobin: 9.6 g/dL — ABNORMAL LOW (ref 13.0–17.0)
MCH: 27.4 pg (ref 26.0–34.0)
MCHC: 30.7 g/dL (ref 30.0–36.0)
MCV: 89.4 fL (ref 80.0–100.0)
Platelets: 155 10*3/uL (ref 150–400)
RBC: 3.5 MIL/uL — ABNORMAL LOW (ref 4.22–5.81)
RDW: 14.1 % (ref 11.5–15.5)
WBC: 7.8 10*3/uL (ref 4.0–10.5)
nRBC: 0 % (ref 0.0–0.2)

## 2022-09-09 LAB — SALICYLATE LEVEL: Salicylate Lvl: <7 mg/dL — ABNORMAL LOW (ref 7.0–30.0)

## 2022-09-09 LAB — CK: Total CK: 215 U/L (ref 49–397)

## 2022-09-09 LAB — CBG MONITORING, ED
Glucose-Capillary: 142 mg/dL — ABNORMAL HIGH (ref 70–99)
Glucose-Capillary: 169 mg/dL — ABNORMAL HIGH (ref 70–99)

## 2022-09-09 LAB — ETHANOL: Alcohol, Ethyl (B): <10 mg/dL (ref <10)

## 2022-09-09 MED ORDER — ONDANSETRON HCL 4 MG PO TABS: 4.0000 mg | ORAL_TABLET | Freq: Four times a day (QID) | ORAL | Status: DC | PRN

## 2022-09-09 MED ORDER — HEPARIN SODIUM (PORCINE) 5000 UNIT/ML IJ SOLN
5000.0000 [IU] | Freq: Three times a day (TID) | INTRAMUSCULAR | Status: DC
  Administered 2022-09-09 – 2022-10-18 (×117): 5000 [IU] via SUBCUTANEOUS
  Filled 2022-09-09 (×118): qty 1

## 2022-09-09 MED ORDER — SEVELAMER CARBONATE 800 MG PO TABS
800.0000 mg | ORAL_TABLET | Freq: Three times a day (TID) | ORAL | Status: DC
  Administered 2022-09-09 – 2022-09-29 (×59): 800 mg via ORAL
  Filled 2022-09-09 (×58): qty 1

## 2022-09-09 MED ORDER — INSULIN ASPART 100 UNIT/ML IJ SOLN
0.0000 [IU] | Freq: Three times a day (TID) | INTRAMUSCULAR | Status: DC
  Administered 2022-09-10 (×2): 2 [IU] via SUBCUTANEOUS
  Administered 2022-09-10: 3 [IU] via SUBCUTANEOUS
  Administered 2022-09-11: 1 [IU] via SUBCUTANEOUS
  Administered 2022-09-11 – 2022-09-12 (×3): 2 [IU] via SUBCUTANEOUS
  Administered 2022-09-12: 1 [IU] via SUBCUTANEOUS
  Administered 2022-09-13 – 2022-09-14 (×2): 2 [IU] via SUBCUTANEOUS
  Administered 2022-09-14 – 2022-09-16 (×3): 1 [IU] via SUBCUTANEOUS
  Administered 2022-09-16 – 2022-09-18 (×3): 2 [IU] via SUBCUTANEOUS

## 2022-09-09 MED ORDER — ATORVASTATIN CALCIUM 40 MG PO TABS
40.0000 mg | ORAL_TABLET | Freq: Every day | ORAL | Status: DC
  Administered 2022-09-09 – 2022-10-18 (×40): 40 mg via ORAL
  Filled 2022-09-09 (×40): qty 1

## 2022-09-09 MED ORDER — DOLUTEGRAVIR-RILPIVIRINE 50-25 MG PO TABS
1.0000 | ORAL_TABLET | Freq: Every day | ORAL | Status: DC
  Administered 2022-09-09 – 2022-09-11 (×3): 1 via ORAL
  Filled 2022-09-09 (×4): qty 1

## 2022-09-09 MED ORDER — ONDANSETRON HCL 4 MG/2ML IJ SOLN
4.0000 mg | Freq: Four times a day (QID) | INTRAMUSCULAR | Status: DC | PRN
  Administered 2022-09-15: 4 mg via INTRAVENOUS
  Filled 2022-09-09: qty 2

## 2022-09-09 MED ORDER — DILTIAZEM HCL ER COATED BEADS 120 MG PO CP24
120.0000 mg | ORAL_CAPSULE | Freq: Every day | ORAL | Status: DC
  Administered 2022-09-09 – 2022-10-18 (×40): 120 mg via ORAL
  Filled 2022-09-09 (×42): qty 1

## 2022-09-09 MED ORDER — CLOPIDOGREL BISULFATE 75 MG PO TABS
75.0000 mg | ORAL_TABLET | Freq: Every day | ORAL | Status: DC
  Administered 2022-09-09 – 2022-10-18 (×40): 75 mg via ORAL
  Filled 2022-09-09 (×40): qty 1

## 2022-09-09 MED ORDER — SODIUM CHLORIDE 0.9 % IV BOLUS
1000.0000 mL | Freq: Once | INTRAVENOUS | Status: AC
  Administered 2022-09-09: 1000 mL via INTRAVENOUS

## 2022-09-09 MED ORDER — ACETAMINOPHEN 650 MG RE SUPP: 650.0000 mg | Freq: Four times a day (QID) | RECTAL | Status: DC | PRN

## 2022-09-09 MED ORDER — ASPIRIN 81 MG PO CHEW
81.0000 mg | CHEWABLE_TABLET | Freq: Every day | ORAL | Status: DC
  Administered 2022-09-09 – 2022-10-18 (×40): 81 mg via ORAL
  Filled 2022-09-09 (×40): qty 1

## 2022-09-09 MED ORDER — INSULIN GLARGINE-YFGN 100 UNIT/ML ~~LOC~~ SOLN: 6.0000 [IU] | Freq: Every day | SUBCUTANEOUS | Status: DC

## 2022-09-09 MED ORDER — HYDRALAZINE HCL 20 MG/ML IJ SOLN: 10.0000 mg | INTRAMUSCULAR | Status: DC | PRN

## 2022-09-09 MED ORDER — SODIUM CHLORIDE 0.9 % IV SOLN
INTRAVENOUS | Status: DC
  Administered 2022-09-10: 1000 mL via INTRAVENOUS

## 2022-09-09 MED ORDER — SODIUM CHLORIDE 0.9% FLUSH
3.0000 mL | Freq: Two times a day (BID) | INTRAVENOUS | Status: DC
  Administered 2022-09-10 – 2022-10-17 (×59): 3 mL via INTRAVENOUS

## 2022-09-09 MED ORDER — ACETAMINOPHEN 325 MG PO TABS
650.0000 mg | ORAL_TABLET | Freq: Four times a day (QID) | ORAL | Status: DC | PRN
  Administered 2022-09-13 – 2022-09-25 (×10): 650 mg via ORAL
  Filled 2022-09-09 (×11): qty 2

## 2022-09-09 MED ORDER — UMECLIDINIUM BROMIDE 62.5 MCG/ACT IN AEPB
1.0000 | INHALATION_SPRAY | Freq: Every day | RESPIRATORY_TRACT | Status: DC
  Administered 2022-09-10 – 2022-10-18 (×33): 1 via RESPIRATORY_TRACT
  Filled 2022-09-09 (×5): qty 7

## 2022-09-09 MED ORDER — MOMETASONE FURO-FORMOTEROL FUM 200-5 MCG/ACT IN AERO
2.0000 | INHALATION_SPRAY | Freq: Two times a day (BID) | RESPIRATORY_TRACT | Status: DC
  Administered 2022-09-10 – 2022-10-18 (×69): 2 via RESPIRATORY_TRACT
  Filled 2022-09-09 (×2): qty 8.8

## 2022-09-09 MED ORDER — INSULIN ASPART 100 UNIT/ML IJ SOLN
0.0000 [IU] | Freq: Every day | INTRAMUSCULAR | Status: DC
  Administered 2022-09-14: 2 [IU] via SUBCUTANEOUS

## 2022-09-09 MED ORDER — ALBUTEROL SULFATE (2.5 MG/3ML) 0.083% IN NEBU: 2.5000 mg | INHALATION_SOLUTION | Freq: Four times a day (QID) | RESPIRATORY_TRACT | Status: DC | PRN

## 2022-09-09 MED ORDER — GABAPENTIN 300 MG PO CAPS
300.0000 mg | ORAL_CAPSULE | Freq: Every day | ORAL | Status: DC
  Administered 2022-09-09 – 2022-09-18 (×10): 300 mg via ORAL
  Filled 2022-09-09 (×10): qty 1

## 2022-09-09 MED ORDER — FLUOXETINE HCL 20 MG PO CAPS
20.0000 mg | ORAL_CAPSULE | Freq: Every day | ORAL | Status: DC
  Administered 2022-09-09 – 2022-10-18 (×40): 20 mg via ORAL
  Filled 2022-09-09 (×40): qty 1

## 2022-09-09 MED ORDER — ISOSORBIDE MONONITRATE ER 30 MG PO TB24
15.0000 mg | ORAL_TABLET | Freq: Every day | ORAL | Status: DC
  Administered 2022-09-09 – 2022-10-18 (×40): 15 mg via ORAL
  Filled 2022-09-09 (×41): qty 1

## 2022-09-09 NOTE — ED Triage Notes (Signed)
Pt comes via [facility] for CP that has been going on for the past week, did crack a few hours ago, and then began to have SOB. PTA received 324 ASA and one nitro

## 2022-09-09 NOTE — H&P (Addendum)
History and Physical    Patient: Michael [name] PJA:[id number] DOB: Mar 15, 1951 DOA: 09/09/2022 DOS: the patient was seen and examined on 09/09/2022 PCP: [name], MD  Patient coming from: [facility]  Chief Complaint:  Chief Complaint  Patient presents with   Suicidal   HPI: Michael [name] is a 71 y.o. male with medical history significant of HTN, chronic HFrEF with recovered LVEF 60 to 65%, PAF on Eliquis,, CAD s/p CABG and stenting, high DDM, CKD stage IIIb, HIV on HAART, and cocaine abuse who presents with complaints of left-sided chest pain starting yesterday evening. Patient had just recently been hospitalized from 11/30-12/1 with complains of chest pain.  Patient was recommended to continue aspirin and Plavix, restarted on atorvastatin, and started on Imdur.  He reports that he had not been taking his medications as prescribed since being discharged although he has them.  He states that the chest pain came before he used crack cocaine.  Describes it as left-sided with radiation to the center of his chest.  Associated symptoms included nausea.  Denied any vomiting, diarrhea, or recent falls.  He is also had some discomfort with urination and right flank pain.  Denies any prior history of kidney stones.  He states that he has homeless and had thoughts of wanting to kill himself, but did not have a specific plan.  Patient still reports making urine.  In the emergency department patient was found to have blood pressures elevated up to 179/103 and all other vital signs maintained.  Labs significant for hemoglobin 9.6, CO2 15, BUN 81, creatinine 6.32, anion gap 13, and high-sensitivity troponin 30.  Chest x-ray showed no acute cardiopulmonary disease.  Patient was given 1 L normal saline IV fluids.  TRH called to admit.  Review of Systems: As mentioned in the history of present illness. All other systems reviewed and are negative. Past Medical History:  Diagnosis Date   A-fib ([place])     Anemia    Asthma    No PFTs, history of childhood asthma   CAD (coronary artery disease)    Cellulitis 04/2014   left facial   Cellulitis and abscess of toe of right foot 12/08/2019   Chondromalacia of medial femoral condyle    Left knee MRI 04/28/12: Chondromalacia of the medial femoral condyle with slight peripheral degeneration of the meniscocapsular junction of the medial meniscus; followed by sports medicine   Chronic kidney failure, stage 4 (severe) ([place])    Collagen vascular disease ([place])    Crack cocaine use    for 20+ years, has been enrolled in detox programs in the past   Depression    with history of hospitalization for suicidal ideation   Diabetes mellitus 2002   Diagnosed in 2002, started insulin in 2012   Gout    Headache(784.0)    CT head 08/2011: Periventricular and subcortical white matter hypodensities are most in keeping with chronic microangiopathic change   HIV infection ([place]) 08/2011   Followed by [name]   Hyperlipidemia    Hypertension    Pulmonary embolism [facility]    Past Surgical History:  Procedure Laterality Date   AMPUTATION Right 07/21/2019   Procedure: RIGHT SECOND TOE AMPUTATION;  Surgeon: [name], MD;  Location: [facility];  Service: Orthopedics;  Laterality: Right;   BACK SURGERY     1988   BOWEL RESECTION     CARDIAC SURGERY     CERVICAL SPINE SURGERY     " rods in  my neck "   CORONARY ARTERY BYPASS GRAFT     CORONARY STENT PLACEMENT     NM MYOCAR PERF WALL MOTION  12/27/2011   normal   SPINE SURGERY     Social History:  reports that he has never smoked. He has never used smokeless tobacco. He reports that he does not currently use drugs after having used the following drugs: "Crack" cocaine and Cocaine. Frequency: 4.00 times per week. He reports that he does not drink alcohol.  No Known Allergies  Family History  Problem Relation Age of Onset   Diabetes Mother    Hypertension Mother    Hyperlipidemia Mother    Diabetes  Father    Cancer Father    Hypertension Father    Diabetes Brother    Heart disease Brother    Diabetes Sister    Colon cancer Neg Hx     Prior to Admission medications   Medication Sig Start Date End Date Taking? Authorizing Provider  Accu-Chek Softclix Lancets lancets Use as directed up to four times daily 09/06/22   Elodia Florence., MD  albuterol (VENTOLIN HFA) 108 (90 Base) MCG/ACT inhaler Inhale 1-2 puffs into the lungs every 6 (six) hours as needed for wheezing or shortness of breath. 09/06/22   Elodia Florence., MD  aspirin 81 MG chewable tablet Chew 1 tablet (81 mg total) by mouth daily. 09/06/22 11/05/22  Elodia Florence., MD  atorvastatin (LIPITOR) 40 MG tablet Take 1 tablet (40 mg total) by mouth daily. 09/06/22 11/05/22  Elodia Florence., MD  Blood Glucose Monitoring Suppl (ACCU-CHEK GUIDE) w/Device KIT Use as directed. 09/06/22   Elodia Florence., MD  budesonide-formoterol [facility]) 160-4.5 MCG/ACT inhaler Inhale 2 puffs into the lungs in the morning and at bedtime. 09/06/22   Elodia Florence., MD  clopidogrel (PLAVIX) 75 MG tablet Take 1 tablet (75 mg total) by mouth daily. 09/06/22 11/05/22  Elodia Florence., MD  diltiazem (CARDIZEM CD) 120 MG 24 hr capsule Take 1 capsule (120 mg total) by mouth daily. 09/07/22 11/06/22  Elodia Florence., MD  dolutegravir-rilpivirine (JULUCA) 50-25 MG tablet Take 1 tablet by mouth daily before lunch. 09/06/22   Elodia Florence., MD  FLUoxetine (PROZAC) 20 MG capsule Take 1 capsule (20 mg total) by mouth daily. 09/06/22 11/05/22  Elodia Florence., MD  gabapentin (NEURONTIN) 300 MG capsule Take 1 capsule (300 mg total) by mouth at bedtime. 09/06/22 11/05/22  Elodia Florence., MD  glucose blood test strip use as directed up to four times daily 09/06/22   Elodia Florence., MD  INCRUSE ELLIPTA 62.5 MCG/ACT AEPB Inhale 1 puff into the lungs daily. 09/06/22 11/05/22  Elodia Florence., MD   insulin glargine (LANTUS SOLOSTAR) 100 UNIT/ML Solostar Pen Inject 6 Units into the skin daily. 09/06/22   Elodia Florence., MD  Insulin Pen Needle 31G X 5 MM MISC Use with insulin pens Patient taking differently: 1 each by Other route See admin instructions. Use with insulin pens 10/24/21   [name], MD  Insulin Pen Needle 32G X 4 MM MISC Use as directed up to 4 times daily. 09/06/22   Elodia Florence., MD  isosorbide mononitrate (IMDUR) 30 MG 24 hr tablet Take 0.5 tablets (15 mg total) by mouth daily. 09/07/22 11/06/22  Elodia Florence., MD  nitroGLYCERIN (NITROSTAT) 0.4 MG SL tablet Place 1 tablet (0.4  mg total) under the tongue every 5 (five) minutes as needed for chest pain. 10/24/21   [name], MD  sevelamer carbonate (RENVELA) 800 MG tablet Take 1 tablet (800 mg total) by mouth 3 (three) times daily. 09/06/22 11/05/22  Elodia Florence., MD    Physical Exam: Vitals:   09/09/22 0244 09/09/22 0841  BP: (!) 179/103 (!) 173/97  Pulse: 87 73  Resp: 20 18  Temp: 98.1 F (36.7 C) 97.8 F (36.6 C)  TempSrc: Oral Oral  SpO2: 100% 100%   Exam  Constitutional: Elderly male who appears to be in no acute distress at this time. Eyes: PERRL, lids and conjunctivae normal ENMT: Mucous membranes are moist. Posterior pharynx clear of any exudate or lesions.Normal dentition.  Neck: normal, supple, no masses, no thyromegaly Respiratory: clear to auscultation bilaterally, no wheezing, no crackles. Normal respiratory effort. No accessory muscle use.  Cardiovascular: Regular rate and rhythm, no murmurs / rubs / gallops. No extremity edema. 2+ pedal pulses. No carotid bruits.  Abdomen: Right CVA tenderness present. Musculoskeletal: no clubbing / cyanosis. No joint deformity upper and lower extremities. Good ROM, no contractures. Normal muscle tone.  Skin: no rashes, lesions, ulcers. No induration Neurologic: CN 2-12 grossly intact. Sensation intact, DTR normal.  Strength 5/5 in all 4.  Psychiatric: Normal judgment and insight. Alert and oriented x 3. Normal mood.   Data Reviewed:  EKG revealed normal sinus rhythm 85 bpm.  Reviewed labs, imaging, and pertinent records as noted above in HPI.   Assessment and Plan: Acute kidney injury superimposed on chronic kidney disease stage IIIb Patient presents with creatinine elevated up to 6.32 with BUN 81 and CK2 50.  Baseline creatinine previously noted to be around 2.51 when patient left the hospital 3 days ago.  On physical exam patient with right-sided flank pain.  Patient had been given 1 L normal saline IV fluids. -Admit to a telemetry bed -Strict I&O's and daily weights -Check urinalysis -Check renal ultrasound -Normal saline IV fluids at 100 mL/h -Avoid nephrotoxic agents -Consider the need to formally consult nephrology if kidney function does not appear to be improving  Suicidal ideation depression Acute.  Patient reports thoughts of wanting to harm himself, but does not specify plan.  Acetaminophen and salicylate levels were undetectable.  He had been recommended to start on Prozac 20 mg daily but had not done so yet. -Suicide precautions with sitter -Start Prozac -Consult psychiatry once medically stable  Chest pain CAD elevated troponin Chronic.  Patient reports history of to pain over the last year that have persisted after leaving the hospital.  High-sensitivity troponin 30 which appears similar to the prior.  EKG without significant ischemic changes.  Prior history of CABG and PCI.  He had been evaluated by cardiology during last hospitalization and recommended to be on aspirin, Plavix, and Imdur.  Thought not to be a good candidate for invasive evaluation due to poor adherence to medication and follow-up. -Continue aspirin, Plavix, and Imdur  Essential hypertension Blood pressures initially elevated up to 179/103. -Continue current medication regimen -Hydralazine  IV as needed for  elevated blood pressures  Heart failure with preserved EF Repeat echocardiogram noted EF to be 55% at the end of last month.  Patient appears to be euvolemic at this time.  HIV -Resume HARRT -Recommend outpatient follow up with infectious disease  Paroxysmal atrial fibrillation Patient appears to be in sinus rhythm at this time. -Continue diltiazem  Anemia of chronic kidney disease Hemoglobin 9.6  g/dL which appears similar to prior. -Continue to monitor  Diabetes mellitus type 2, with hyperglycemia Last available hemoglobin A1c 7.7 from 07/02/2022 regimen recommended of Lantus 6 units daily during last admission. -Hypoglycemic protocols -Pharmacy substitution of Semglee -CBGs before every meal with sensitive SSI -Adjust insulin regimen as needed  History of PE During last hospitalization he was recommended to discontinue Eliquis due to poor compliance with taking the medication.  Cocaine abuse Patient admits to using cocaine yesterday evening prior to coming to the hospital. -Continue  DVT prophylaxis: Heparin Advance Care Planning:   Code Status: Full Code   Consults: None  Family Communication: None requested  Severity of Illness: The appropriate patient status for this patient is OBSERVATION. Observation status is judged to be reasonable and necessary in order to provide the required intensity of service to ensure the patient's safety. The patient's presenting symptoms, physical exam findings, and initial radiographic and laboratory data in the context of their medical condition is felt to place them at decreased risk for further clinical deterioration. Furthermore, it is anticipated that the patient will be medically stable for discharge from the hospital within 2 midnights of admission.   Author: Norval Morton, MD 09/09/2022 9:57 AM  For on call review [email or web address].

## 2022-09-09 NOTE — ED Provider Notes (Signed)
[facility] EMERGENCY DEPARTMENT Provider Note   CSN: [id number] Arrival date & time: 09/09/22  7939     History  Chief Complaint  Patient presents with   Suicidal    Michael [name] is a 71 y.o. male.  Patient here with suicidal ideation.  Denies plan.  Admits to crack use.  May be some chest discomfort, cough.  Nothing makes it worse or better.  Thinks that he may need to be psychiatrically admitted.  Not having any fever or chills.  Nothing makes it worse or better.  Denies any exertional symptoms.  Chest pain started shortly after drug use.  The history is provided by the patient.       Home Medications Prior to Admission medications   Medication Sig Start Date End Date Taking? Authorizing Provider  Accu-Chek Softclix Lancets lancets Use as directed up to four times daily 09/06/22   Elodia Florence., MD  albuterol (VENTOLIN HFA) 108 (90 Base) MCG/ACT inhaler Inhale 1-2 puffs into the lungs every 6 (six) hours as needed for wheezing or shortness of breath. 09/06/22   Elodia Florence., MD  aspirin 81 MG chewable tablet Chew 1 tablet (81 mg total) by mouth daily. 09/06/22 11/05/22  Elodia Florence., MD  atorvastatin (LIPITOR) 40 MG tablet Take 1 tablet (40 mg total) by mouth daily. 09/06/22 11/05/22  Elodia Florence., MD  Blood Glucose Monitoring Suppl (ACCU-CHEK GUIDE) w/Device KIT Use as directed. 09/06/22   Elodia Florence., MD  budesonide-formoterol [facility]) 160-4.5 MCG/ACT inhaler Inhale 2 puffs into the lungs in the morning and at bedtime. 09/06/22   Elodia Florence., MD  clopidogrel (PLAVIX) 75 MG tablet Take 1 tablet (75 mg total) by mouth daily. 09/06/22 11/05/22  Elodia Florence., MD  diltiazem (CARDIZEM CD) 120 MG 24 hr capsule Take 1 capsule (120 mg total) by mouth daily. 09/07/22 11/06/22  Elodia Florence., MD  dolutegravir-rilpivirine (JULUCA) 50-25 MG tablet Take 1 tablet by mouth daily before lunch.  09/06/22   Elodia Florence., MD  FLUoxetine (PROZAC) 20 MG capsule Take 1 capsule (20 mg total) by mouth daily. 09/06/22 11/05/22  Elodia Florence., MD  gabapentin (NEURONTIN) 300 MG capsule Take 1 capsule (300 mg total) by mouth at bedtime. 09/06/22 11/05/22  Elodia Florence., MD  glucose blood test strip use as directed up to four times daily 09/06/22   Elodia Florence., MD  INCRUSE ELLIPTA 62.5 MCG/ACT AEPB Inhale 1 puff into the lungs daily. 09/06/22 11/05/22  Elodia Florence., MD  insulin glargine (LANTUS SOLOSTAR) 100 UNIT/ML Solostar Pen Inject 6 Units into the skin daily. 09/06/22   Elodia Florence., MD  Insulin Pen Needle 31G X 5 MM MISC Use with insulin pens Patient taking differently: 1 each by Other route See admin instructions. Use with insulin pens 10/24/21   [name], MD  Insulin Pen Needle 32G X 4 MM MISC Use as directed up to 4 times daily. 09/06/22   Elodia Florence., MD  isosorbide mononitrate (IMDUR) 30 MG 24 hr tablet Take 0.5 tablets (15 mg total) by mouth daily. 09/07/22 11/06/22  Elodia Florence., MD  nitroGLYCERIN (NITROSTAT) 0.4 MG SL tablet Place 1 tablet (0.4 mg total) under the tongue every 5 (five) minutes as needed for chest pain. 10/24/21   [name], MD  sevelamer carbonate (RENVELA) 800 MG tablet Take  1 tablet (800 mg total) by mouth 3 (three) times daily. 09/06/22 11/05/22  Elodia Florence., MD      Allergies    Patient has no known allergies.    Review of Systems   Review of Systems  Physical Exam Updated Vital Signs  ED Triage Vitals [09/09/22 0244]  Enc Vitals Group     BP (!) 179/103     Pulse Rate 87     Resp 20     Temp 98.1 F (36.7 C)     Temp Source Oral     SpO2 100 %     Weight      Height      Head Circumference      Peak Flow      Pain Score 10     Pain Loc      Pain Edu?      Excl. in Day?     Physical Exam Vitals and nursing note reviewed.  Constitutional:      General:  He is not in acute distress.    Appearance: He is well-developed. He is not ill-appearing.  HENT:     Head: Normocephalic and atraumatic.     Nose: Nose normal.     Mouth/Throat:     Mouth: Mucous membranes are moist.  Eyes:     Extraocular Movements: Extraocular movements intact.     Conjunctiva/sclera: Conjunctivae normal.     Pupils: Pupils are equal, round, and reactive to light.  Cardiovascular:     Rate and Rhythm: Normal rate and regular rhythm.     Pulses: Normal pulses.     Heart sounds: Normal heart sounds. No murmur heard. Pulmonary:     Effort: Pulmonary effort is normal. No respiratory distress.     Breath sounds: Normal breath sounds.  Abdominal:     Palpations: Abdomen is soft.     Tenderness: There is no abdominal tenderness.  Musculoskeletal:        General: No swelling.     Cervical back: Normal range of motion and neck supple.  Skin:    General: Skin is warm and dry.     Capillary Refill: Capillary refill takes less than 2 seconds.  Neurological:     Mental Status: He is alert.  Psychiatric:     Comments: Suicidal, no plan     ED Results / Procedures / Treatments   Labs (all labs ordered are listed, but only abnormal results are displayed) Labs Reviewed  COMPREHENSIVE METABOLIC PANEL - Abnormal; Notable for the following components:      Result Value   CO2 15 (*)    Glucose, Bld 123 (*)    BUN 81 (*)    Creatinine, Ser 6.32 (*)    GFR, Estimated 9 (*)    All other components within normal limits  SALICYLATE LEVEL - Abnormal; Notable for the following components:   Salicylate Lvl <3.2 (*)    All other components within normal limits  ACETAMINOPHEN LEVEL - Abnormal; Notable for the following components:   Acetaminophen (Tylenol), Serum <10 (*)    All other components within normal limits  CBC - Abnormal; Notable for the following components:   RBC 3.50 (*)    Hemoglobin 9.6 (*)    HCT 31.3 (*)    All other components within normal limits   TROPONIN I (HIGH SENSITIVITY) - Abnormal; Notable for the following components:   Troponin I (High Sensitivity) 30 (*)    All other components within  normal limits  ETHANOL  RAPID URINE DRUG SCREEN, HOSP PERFORMED  CK  URINALYSIS, ROUTINE W REFLEX MICROSCOPIC  TROPONIN I (HIGH SENSITIVITY)    EKG EKG Interpretation  Date/Time:  Monday September 09 2022 03:15:08 EST Ventricular Rate:  85 PR Interval:  130 QRS Duration: 80 QT Interval:  346 QTC Calculation: 411 R Axis:   63 Text Interpretation: Normal sinus rhythm Confirmed by Lennice Sites ([id number]) on 09/09/2022 8:01:27 AM  Radiology DG Chest 2 View  Result Date: 09/09/2022 CLINICAL DATA:  Chest pain and shortness of breath. EXAM: CHEST - 2 VIEW COMPARISON:  September 05, 2022 FINDINGS: Multiple sternal wires are noted. The heart size and mediastinal contours are within normal limits. A left atrial appendage clip is seen. Both lungs are clear. A radiopaque fusion plate and screws are seen overlying the lower cervical spine. Multilevel degenerative changes are seen throughout the thoracic spine. IMPRESSION: 1. Evidence of prior median sternotomy/CABG. 2. No acute cardiopulmonary disease. Electronically Signed   By: Virgina Norfolk M.D.   On: 09/09/2022 03:24    Procedures Procedures    Medications Ordered in ED Medications  sodium chloride 0.9 % bolus 1,000 mL (has no administration in time range)    ED Course/ Medical Decision Making/ A&P                           Medical Decision Making Amount and/or Complexity of Data Reviewed Labs: ordered. Radiology: ordered.  Risk Decision regarding hospitalization.   Michael [name] is here with suicidal ideation.  Also with chest pain after crack cocaine use today.  History of CAD status post CABG, diabetes, HIV, atrial fibrillation.  Overall he appears well.  Unremarkable vitals.  Suicidal but no plan.  Will need to medically clear him for TTS evaluation.  Patient here  voluntarily.  Will need to rule out on differential ACS, infectious process, anemia.  Suspect chest pain in the setting of drug use.  He is clear breath sounds.  No signs of respiratory distress.  Will get CBC, BMP, troponin, urine drug screen, chest x-ray.  EKG per my review and interpretation shows sinus rhythm.  No ischemic changes.  Creatinine is 6.32.  This is well above his baseline.  He was just discharged with a creatinine less than 3.  Suspect that this could be from substance abuse or dehydration.  He does not appear uremic however.  Denies any GI losses.  Alcohol level is normal.  Troponin at baseline.  Per further review and interpretation of his labs he does not have any significant anemia or leukocytosis.  Chest x-ray per my review and interpretation shows no evidence of pneumonia or pneumothorax.  Overall given significant AKI will need to get that corrected and further worked up.  Will add a CK.  To be admitted to medicine for further care.  This chart was dictated using voice recognition software.  Despite best efforts to proofread,  errors can occur which can change the documentation meaning.         Final Clinical Impression(s) / ED Diagnoses Final diagnoses:  AKI (acute kidney injury) ([place])  Polysubstance abuse [facility])    Rx / [place] Orders ED Discharge Orders     None         Lennice Sites, DO 09/09/22 [phone]

## 2022-09-09 NOTE — ED Triage Notes (Signed)
Pt states that he is now suicidal with no plan

## 2022-09-09 NOTE — ED Notes (Signed)
Pt is a hard stick.reject x2

## 2022-09-09 NOTE — ED Provider Triage Note (Signed)
Emergency Medicine Provider Triage Evaluation Note  Michael [name] , a 71 y.o. male  was evaluated in triage.  Pt complains of chest pain for the past week.  Continued crack cocaine use.  Recent admitted for same 11/30-12/1 due to elevated troponins (peak at 56).  Hx of CAD, prior CABG.    During triage patient also reports SI without plan.  Review of Systems  Positive: Chest pain, SI Negative: Fever  Physical Exam  BP (!) 179/103   Pulse 87   Temp 98.1 F (36.7 C) (Oral)   Resp 20   SpO2 100%   Gen:   Awake, no distress   Resp:  Normal effort  MSK:   Moves extremities without difficulty  Other:    Medical Decision Making  Medically screening exam initiated at 3:25 AM.  Appropriate orders placed.  Michael [name] was informed that the remainder of the evaluation will be completed by another provider, this initial triage assessment does not replace that evaluation, and the importance of remaining in the ED until their evaluation is complete.  Chest pain.  Recent admission for same with elevated trops (peak 56).  Does have prior cardiac hx of CAD, CABG.  Also reports SI.  EKG, labs, CXR ordered.  Will remain in triage for safety monitoring.   Michael Pickett, PA-C 09/09/22 [id number]

## 2022-09-10 ENCOUNTER — Other Ambulatory Visit: Payer: Self-pay

## 2022-09-10 DIAGNOSIS — F329 Major depressive disorder, single episode, unspecified: Secondary | ICD-10-CM | POA: Diagnosis not present

## 2022-09-10 DIAGNOSIS — I130 Hypertensive heart and chronic kidney disease with heart failure and stage 1 through stage 4 chronic kidney disease, or unspecified chronic kidney disease: Secondary | ICD-10-CM | POA: Diagnosis present

## 2022-09-10 DIAGNOSIS — Z7984 Long term (current) use of oral hypoglycemic drugs: Secondary | ICD-10-CM | POA: Diagnosis not present

## 2022-09-10 DIAGNOSIS — K5900 Constipation, unspecified: Secondary | ICD-10-CM | POA: Diagnosis not present

## 2022-09-10 DIAGNOSIS — Z951 Presence of aortocoronary bypass graft: Secondary | ICD-10-CM | POA: Diagnosis not present

## 2022-09-10 DIAGNOSIS — I2510 Atherosclerotic heart disease of native coronary artery without angina pectoris: Secondary | ICD-10-CM | POA: Diagnosis present

## 2022-09-10 DIAGNOSIS — N184 Chronic kidney disease, stage 4 (severe): Secondary | ICD-10-CM | POA: Diagnosis present

## 2022-09-10 DIAGNOSIS — R45851 Suicidal ideations: Secondary | ICD-10-CM | POA: Diagnosis not present

## 2022-09-10 DIAGNOSIS — I5032 Chronic diastolic (congestive) heart failure: Secondary | ICD-10-CM | POA: Diagnosis not present

## 2022-09-10 DIAGNOSIS — I5042 Chronic combined systolic (congestive) and diastolic (congestive) heart failure: Secondary | ICD-10-CM | POA: Diagnosis present

## 2022-09-10 DIAGNOSIS — N189 Chronic kidney disease, unspecified: Secondary | ICD-10-CM | POA: Diagnosis not present

## 2022-09-10 DIAGNOSIS — Z7901 Long term (current) use of anticoagulants: Secondary | ICD-10-CM | POA: Diagnosis not present

## 2022-09-10 DIAGNOSIS — N179 Acute kidney failure, unspecified: Secondary | ICD-10-CM | POA: Diagnosis present

## 2022-09-10 DIAGNOSIS — Z5900 Homelessness unspecified: Secondary | ICD-10-CM | POA: Diagnosis not present

## 2022-09-10 DIAGNOSIS — E1165 Type 2 diabetes mellitus with hyperglycemia: Secondary | ICD-10-CM | POA: Diagnosis present

## 2022-09-10 DIAGNOSIS — Z79899 Other long term (current) drug therapy: Secondary | ICD-10-CM | POA: Diagnosis not present

## 2022-09-10 DIAGNOSIS — G8929 Other chronic pain: Secondary | ICD-10-CM | POA: Diagnosis present

## 2022-09-10 DIAGNOSIS — N178 Other acute kidney failure: Secondary | ICD-10-CM | POA: Diagnosis not present

## 2022-09-10 DIAGNOSIS — E1169 Type 2 diabetes mellitus with other specified complication: Secondary | ICD-10-CM | POA: Diagnosis present

## 2022-09-10 DIAGNOSIS — B20 Human immunodeficiency virus [HIV] disease: Secondary | ICD-10-CM | POA: Diagnosis not present

## 2022-09-10 DIAGNOSIS — E1122 Type 2 diabetes mellitus with diabetic chronic kidney disease: Secondary | ICD-10-CM | POA: Diagnosis present

## 2022-09-10 DIAGNOSIS — D631 Anemia in chronic kidney disease: Secondary | ICD-10-CM | POA: Diagnosis present

## 2022-09-10 DIAGNOSIS — Z21 Asymptomatic human immunodeficiency virus [HIV] infection status: Secondary | ICD-10-CM | POA: Diagnosis present

## 2022-09-10 DIAGNOSIS — F1410 Cocaine abuse, uncomplicated: Secondary | ICD-10-CM | POA: Diagnosis present

## 2022-09-10 DIAGNOSIS — E875 Hyperkalemia: Secondary | ICD-10-CM | POA: Diagnosis not present

## 2022-09-10 DIAGNOSIS — I480 Paroxysmal atrial fibrillation: Secondary | ICD-10-CM | POA: Diagnosis present

## 2022-09-10 DIAGNOSIS — F1994 Other psychoactive substance use, unspecified with psychoactive substance-induced mood disorder: Secondary | ICD-10-CM | POA: Diagnosis not present

## 2022-09-10 DIAGNOSIS — I10 Essential (primary) hypertension: Secondary | ICD-10-CM | POA: Diagnosis not present

## 2022-09-10 DIAGNOSIS — F1910 Other psychoactive substance abuse, uncomplicated: Secondary | ICD-10-CM | POA: Diagnosis not present

## 2022-09-10 DIAGNOSIS — E785 Hyperlipidemia, unspecified: Secondary | ICD-10-CM | POA: Diagnosis present

## 2022-09-10 DIAGNOSIS — F331 Major depressive disorder, recurrent, moderate: Secondary | ICD-10-CM | POA: Diagnosis not present

## 2022-09-10 DIAGNOSIS — E440 Moderate protein-calorie malnutrition: Secondary | ICD-10-CM | POA: Diagnosis present

## 2022-09-10 LAB — BASIC METABOLIC PANEL
Anion gap: 6 (ref 5–15)
Anion gap: 7 (ref 5–15)
BUN: 68 mg/dL — ABNORMAL HIGH (ref 8–23)
BUN: 64 mg/dL — ABNORMAL HIGH (ref 8–23)
CO2: 19 mmol/L — ABNORMAL LOW (ref 22–32)
CO2: 17 mmol/L — ABNORMAL LOW (ref 22–32)
Calcium: 7.8 mg/dL — ABNORMAL LOW (ref 8.9–10.3)
Calcium: 7.9 mg/dL — ABNORMAL LOW (ref 8.9–10.3)
Chloride: 111 mmol/L (ref 98–111)
Chloride: 112 mmol/L — ABNORMAL HIGH (ref 98–111)
Creatinine, Ser: 4.63 mg/dL — ABNORMAL HIGH (ref 0.61–1.24)
Creatinine, Ser: 4.42 mg/dL — ABNORMAL HIGH (ref 0.61–1.24)
GFR, Estimated: 13 mL/min — ABNORMAL LOW (ref >60)
GFR, Estimated: 13 mL/min — ABNORMAL LOW (ref >60)
Glucose, Bld: 172 mg/dL — ABNORMAL HIGH (ref 70–99)
Glucose, Bld: 187 mg/dL — ABNORMAL HIGH (ref 70–99)
Potassium: 4.4 mmol/L (ref 3.5–5.1)
Potassium: 4.7 mmol/L (ref 3.5–5.1)
Sodium: 136 mmol/L (ref 135–145)
Sodium: 136 mmol/L (ref 135–145)

## 2022-09-10 LAB — CBC
HCT: 24.9 % — ABNORMAL LOW (ref 39.0–52.0)
Hemoglobin: 7.9 g/dL — ABNORMAL LOW (ref 13.0–17.0)
MCH: 27.6 pg (ref 26.0–34.0)
MCHC: 31.7 g/dL (ref 30.0–36.0)
MCV: 87.1 fL (ref 80.0–100.0)
Platelets: 124 10*3/uL — ABNORMAL LOW (ref 150–400)
RBC: 2.86 MIL/uL — ABNORMAL LOW (ref 4.22–5.81)
RDW: 13.7 % (ref 11.5–15.5)
WBC: 5.3 10*3/uL (ref 4.0–10.5)
nRBC: 0 % (ref 0.0–0.2)

## 2022-09-10 LAB — GLUCOSE, CAPILLARY
Glucose-Capillary: 198 mg/dL — ABNORMAL HIGH (ref 70–99)
Glucose-Capillary: 128 mg/dL — ABNORMAL HIGH (ref 70–99)

## 2022-09-10 LAB — CBG MONITORING, ED
Glucose-Capillary: 139 mg/dL — ABNORMAL HIGH (ref 70–99)
Glucose-Capillary: 155 mg/dL — ABNORMAL HIGH (ref 70–99)
Glucose-Capillary: 213 mg/dL — ABNORMAL HIGH (ref 70–99)
Glucose-Capillary: 180 mg/dL — ABNORMAL HIGH (ref 70–99)

## 2022-09-10 MED ORDER — METOPROLOL TARTRATE 5 MG/5ML IV SOLN: 5.0000 mg | INTRAVENOUS | Status: DC | PRN

## 2022-09-10 MED ORDER — TRAZODONE HCL 50 MG PO TABS
50.0000 mg | ORAL_TABLET | Freq: Every evening | ORAL | Status: DC | PRN
  Administered 2022-09-11 – 2022-09-17 (×7): 50 mg via ORAL
  Filled 2022-09-10 (×8): qty 1

## 2022-09-10 MED ORDER — IPRATROPIUM-ALBUTEROL 0.5-2.5 (3) MG/3ML IN SOLN
3.0000 mL | RESPIRATORY_TRACT | Status: DC | PRN
  Administered 2022-09-16 – 2022-09-19 (×2): 3 mL via RESPIRATORY_TRACT
  Filled 2022-09-10 (×2): qty 3

## 2022-09-10 MED ORDER — SENNOSIDES-DOCUSATE SODIUM 8.6-50 MG PO TABS
1.0000 | ORAL_TABLET | Freq: Every evening | ORAL | Status: DC | PRN
  Administered 2022-09-16 – 2022-09-17 (×2): 1 via ORAL
  Filled 2022-09-10 (×2): qty 1

## 2022-09-10 MED ORDER — GUAIFENESIN 100 MG/5ML PO LIQD: 5.0000 mL | ORAL | Status: DC | PRN

## 2022-09-10 MED ORDER — HYDRALAZINE HCL 20 MG/ML IJ SOLN: 10.0000 mg | INTRAMUSCULAR | Status: DC | PRN

## 2022-09-10 NOTE — Consult Note (Signed)
[facility] New Face-to-Face Psychiatric Evaluation  Service Date: September 10, 2022 LOS:  LOS: 0 days   Assessment  Michael [name] is a 71 y.o. male admitted medically for 09/09/2022  2:38 AM for AKI on CKD stage IIIb. He carries the psychiatric diagnoses of major depressive disorder, cocaine use disorder and has a past medical history of CAD s/p CABG and stenting, HTN, HFrEF with recovered LVEF 60-65%, HIV, T2DM, gout, HLD, PAF on eliquis. Psychiatry was consulted for suicidal ideation by Dr. Reesa Chew.   His current presentation of suicidal ideation with no plan, inconsistency with suicidal ideation and in the setting of recent cocaine use is most consistent with substance-induced mood disorder. He meets criteria for substance-induced mood disorder based on recent worsening mood in the setting of recent cocaine use a week ago. Current outpatient psychotropic medications include prozac 20 and historically he has been non-compliant with medications prior to admission which makes it difficult to assess his response. This is evidenced by his inconsistent fill history. On initial examination, patient does not fully engage with interviewer, reports that he is tired and closes his eyes multiple times. He does have prior diagnosis of major depressive disorder and in the setting of his acute on chronic kidney injury, we could continue his prozac or start zoloft or lexapro. No dosing adjustment for prozac is required in setting of renal insufficiency. Given his current reported SI with no plan, will keep 1:1 sitter for now and re-evaluate need for this as well as further discuss medication regimen with patient tomorrow. Please see plan below for detailed recommendations.   Diagnoses:  Active Hospital problems: Principal Problem:   ARF (acute renal failure) ([name]) Active Problems:   HIV (human immunodeficiency virus infection) ([place])   Cocaine use disorder ([name])   Cocaine abuse, continuous  ([place])   3-vessel CAD   Suicidal ideations   Anemia   AKI (acute kidney injury) ([place])   Essential hypertension   History of pulmonary embolism   Paroxysmal A-fib ([place])   Uncontrolled type 2 diabetes mellitus with hyperglycemia ([name])   (HFpEF) heart failure with preserved ejection fraction ([name])   Elevated troponin    Plan  ## Safety and Observation Level:  - Based on my clinical evaluation, I estimate the patient to be at high risk of self harm in the current setting - At this time, we recommend a 1:1 level of observation. This decision is based on my review of the chart including patient's history and current presentation, interview of the patient, mental status examination, and consideration of suicide risk including evaluating suicidal ideation, plan, intent, suicidal or self-harm behaviors, risk factors, and protective factors. This judgment is based on our ability to directly address suicide risk, implement suicide prevention strategies and develop a safety plan while the patient is in the clinical setting. Please contact our team if there is a concern that risk level has changed.   ## Medications:  -- CONTINUE prozac 10m for depression  -- Can consider re-evaluating if patient desires alternative medication regimen tomorrow   ## Medical Decision Making Capacity:  -- Not formally assessed   ## Further Work-up:  -- Per primary   -- most recent EKG on 12/4 had QtC of 411 -- Pertinent labwork reviewed earlier this admission includes: Cr 6.32, Hgb 9.6, UDS+cocaine, aPZWCHEN<27 salicylate<7. Renal U/S normal   ## Disposition:  -- TBD  ## Behavioral / Environmental:  --1:1  ##Legal Status -Voluntary  Thank you for this consult request.  Recommendations have been communicated to the primary team.  We will follow at this time.   Rolanda Lundborg, MD, PGY-1   NEW history  Relevant Aspects of Hospital Course:  Admitted on 09/09/2022 for AKI on CKD stage IIIb.  Patient was  recently admitted to [name] and discharged (12/1) for chest pain rule out. Seen by cardiology who recommended medical management and outpatient follow-up. Noted to have difficulty with adherence and follow-up.    Patient Report:  Patient reports that he started feeling weak on his left side a week before and that he came to the hospital because he was tired and having pain in his kidneys. He reports feeling tired and is closing his eyes and not fully answering questions throughout the interview. He reports that he is sad because of his "lifestyle - being sick all the time." He reports that he is homeless and lives by himself, sleeping at [employer]. He reports prior diagnoses of depression, anxiety. Reports he has been only sleeping 2 hours for the past week. Reports he does go see his outpatient doctors. Reports he has never seen a psychiatrist but has seen a therapist before. He reports he has not been taking his medications. He reports a suicide attempt a year ago when he cut his wrist. When asked about SI, he initially denies current SI and states that his last thought was 2 months ago. When asked about his SI on admission, he then states that he has had SI every day for the past 2 months. He denies any plan.   He reports he last smoked crack cocaine a week ago. He denies other substance use including tobacco, alcohol, marijuana. He reports he previously went to rehab but is unable to state when.   He reports seeing visual hallucinations "little people" and auditory hallucinations "little people talking." He denies delusions, thought insertion, withdrawal, or broadcasting.   ROS:  As above   Collateral information:  Patient declines for anyone to be contacted.   Psychiatric History:  Information collected from chart review  Patient was at inpatient psychiatric hospital at [facility] from 3/10-3/14/22. At the time, was diagnosed with MDD and discharged with lexapro 10. He was admitted for risk of  self-injury and medication non-compliance. Their note states "Patient has had SI and been admitted 4x within the past 6 months. He will ask for help for his SI and demand to be discharged."   Most recently he was discharged from [facility] on prozac 20. Looks like he was prescribed this from [facility] in 7/25. He did not refill until 9/21. Next fill was on 12/1 from [facility].   He has been consulted on by TTS at [facility], notably on 11/04/2021, 11/12/21.  Family psych history: None noted in chart   Social History:  Tobacco use: Denies Alcohol use: Denies Drug use: Reports cocaine use. Last use a week ago. Has been on and off for the past 30 years.   Completed up to 12th grade.  He is widowed.  Has 2 children (per chart review) Reports he is on disability, lives by himself and is homeless but sleeps at hotels.  Grew up in [place], [place] then went to [place], then got a bus back to [place].   Family History:  The patient's family history includes Cancer in his father; Diabetes in his brother, father, mother, and sister; Heart disease in his brother; Hyperlipidemia in his mother; Hypertension in his father and mother.  Medical History: Past Medical History:  Diagnosis Date   A-fib ([place])    Anemia    Asthma    No PFTs, history of childhood asthma   CAD (coronary artery disease)    Cellulitis 04/2014   left facial   Cellulitis and abscess of toe of right foot 12/08/2019   Chondromalacia of medial femoral condyle    Left knee MRI 04/28/12: Chondromalacia of the medial femoral condyle with slight peripheral degeneration of the meniscocapsular junction of the medial meniscus; followed by sports medicine   Chronic kidney failure, stage 4 (severe) ([place])    Collagen vascular disease ([place])    Crack cocaine use    for 20+ years, has been enrolled in detox programs in the past   Depression    with history of hospitalization for suicidal ideation   Diabetes mellitus 2002   Diagnosed in  2002, started insulin in 2012   Gout    Headache(784.0)    CT head 08/2011: Periventricular and subcortical white matter hypodensities are most in keeping with chronic microangiopathic change   HIV infection ([place]) 08/2011   Followed by [name]   Hyperlipidemia    Hypertension    Pulmonary embolism [facility])     Surgical History: Past Surgical History:  Procedure Laterality Date   AMPUTATION Right 07/21/2019   Procedure: RIGHT SECOND TOE AMPUTATION;  Surgeon: [name], MD;  Location: [place];  Service: Orthopedics;  Laterality: Right;   BACK SURGERY     1988   BOWEL RESECTION     CARDIAC SURGERY     CERVICAL SPINE SURGERY     " rods in my neck "   CORONARY ARTERY BYPASS GRAFT     CORONARY STENT PLACEMENT     NM MYOCAR PERF WALL MOTION  12/27/2011   normal   SPINE SURGERY      Medications:   Current Facility-Administered Medications:    0.9 %  sodium chloride infusion, , Intravenous, Continuous, Smith, Rondell A, MD, Stopped at 09/10/22 0753   acetaminophen (TYLENOL) tablet 650 mg, 650 mg, Oral, Q6H PRN **OR** acetaminophen (TYLENOL) suppository 650 mg, 650 mg, Rectal, Q6H PRN, Tamala Julian, Rondell A, MD   aspirin chewable tablet 81 mg, 81 mg, Oral, Daily, Smith, Rondell A, MD, 81 mg at 09/10/22 0750   atorvastatin (LIPITOR) tablet 40 mg, 40 mg, Oral, Daily, Tamala Julian, Rondell A, MD, 40 mg at 09/10/22 0750   clopidogrel (PLAVIX) tablet 75 mg, 75 mg, Oral, Daily, Smith, Rondell A, MD, 75 mg at 09/10/22 0750   diltiazem (CARDIZEM CD) 24 hr capsule 120 mg, 120 mg, Oral, Daily, Smith, Rondell A, MD, 120 mg at 09/10/22 0750   dolutegravir-rilpivirine (JULUCA) 50-25 MG per tablet 1 tablet, 1 tablet, Oral, QAC lunch, Fuller Plan A, MD, 1 tablet at 09/10/22 1118   FLUoxetine (PROZAC) capsule 20 mg, 20 mg, Oral, Daily, Smith, Rondell A, MD, 20 mg at 09/10/22 0750   gabapentin (NEURONTIN) capsule 300 mg, 300 mg, Oral, QHS, Smith, Rondell A, MD, 300 mg at 09/09/22 2111   guaiFENesin  (ROBITUSSIN) 100 MG/5ML liquid 5 mL, 5 mL, Oral, Q4H PRN, Amin, Ankit Chirag, MD   heparin injection 5,000 Units, 5,000 Units, Subcutaneous, Q8H, Smith, Rondell A, MD, 5,000 Units at 09/10/22 0601   hydrALAZINE (APRESOLINE) injection 10 mg, 10 mg, Intravenous, Q4H PRN, Amin, Ankit Chirag, MD   insulin aspart (novoLOG) injection 0-5 Units, 0-5 Units, Subcutaneous, QHS, Smith, Rondell A, MD   insulin aspart (novoLOG) injection 0-9 Units, 0-9 Units, Subcutaneous, TID WC, Smith, Rondell  A, MD, 3 Units at 09/10/22 1121   ipratropium-albuterol (DUONEB) 0.5-2.5 (3) MG/3ML nebulizer solution 3 mL, 3 mL, Nebulization, Q4H PRN, Amin, Ankit Chirag, MD   isosorbide mononitrate (IMDUR) 24 hr tablet 15 mg, 15 mg, Oral, Daily, Smith, Rondell A, MD, 15 mg at 09/10/22 0750   metoprolol tartrate (LOPRESSOR) injection 5 mg, 5 mg, Intravenous, Q4H PRN, Amin, Ankit Chirag, MD   mometasone-formoterol (DULERA) 200-5 MCG/ACT inhaler 2 puff, 2 puff, Inhalation, BID, Smith, Rondell A, MD, 2 puff at 09/10/22 0800   ondansetron (ZOFRAN) tablet 4 mg, 4 mg, Oral, Q6H PRN **OR** ondansetron (ZOFRAN) injection 4 mg, 4 mg, Intravenous, Q6H PRN, Smith, Rondell A, MD   senna-docusate (Senokot-S) tablet 1 tablet, 1 tablet, Oral, QHS PRN, Amin, Ankit Chirag, MD   sevelamer carbonate (RENVELA) tablet 800 mg, 800 mg, Oral, TID with meals, Tamala Julian, Rondell A, MD, 800 mg at 09/10/22 1118   sodium chloride flush (NS) 0.9 % injection 3 mL, 3 mL, Intravenous, Q12H, Smith, Rondell A, MD, 3 mL at 09/10/22 0751   traZODone (DESYREL) tablet 50 mg, 50 mg, Oral, QHS PRN, Amin, Ankit Chirag, MD   umeclidinium bromide (INCRUSE ELLIPTA) 62.5 MCG/ACT 1 puff, 1 puff, Inhalation, Daily, Fuller Plan A, MD, 1 puff at 09/10/22 0801  Current Outpatient Medications:    albuterol (VENTOLIN HFA) 108 (90 Base) MCG/ACT inhaler, Inhale 1-2 puffs into the lungs every 6 (six) hours as needed for wheezing or shortness of breath., Disp: 6.7 g, Rfl: 1   nitroGLYCERIN  (NITROSTAT) 0.4 MG SL tablet, Place 1 tablet (0.4 mg total) under the tongue every 5 (five) minutes as needed for chest pain., Disp: 25 tablet, Rfl: 0   Accu-Chek Softclix Lancets lancets, Use as directed up to four times daily, Disp: 100 each, Rfl: 0   aspirin 81 MG chewable tablet, Chew 1 tablet (81 mg total) by mouth daily., Disp: 30 tablet, Rfl: 1   atorvastatin (LIPITOR) 40 MG tablet, Take 1 tablet (40 mg total) by mouth daily., Disp: 30 tablet, Rfl: 1   Blood Glucose Monitoring Suppl (ACCU-CHEK GUIDE) w/Device KIT, Use as directed., Disp: 1 kit, Rfl: 0   budesonide-formoterol (SYMBICORT) 160-4.5 MCG/ACT inhaler, Inhale 2 puffs into the lungs in the morning and at bedtime., Disp: 10.2 g, Rfl: 2   clopidogrel (PLAVIX) 75 MG tablet, Take 1 tablet (75 mg total) by mouth daily., Disp: 30 tablet, Rfl: 1   diltiazem (CARDIZEM CD) 120 MG 24 hr capsule, Take 1 capsule (120 mg total) by mouth daily., Disp: 30 capsule, Rfl: 1   dolutegravir-rilpivirine (JULUCA) 50-25 MG tablet, Take 1 tablet by mouth daily before lunch., Disp: 30 tablet, Rfl: 1   FLUoxetine (PROZAC) 20 MG capsule, Take 1 capsule (20 mg total) by mouth daily., Disp: 30 capsule, Rfl: 1   gabapentin (NEURONTIN) 300 MG capsule, Take 1 capsule (300 mg total) by mouth at bedtime., Disp: 30 capsule, Rfl: 1   glucose blood test strip, use as directed up to four times daily, Disp: 100 each, Rfl: 0   INCRUSE ELLIPTA 62.5 MCG/ACT AEPB, Inhale 1 puff into the lungs daily., Disp: 30 each, Rfl: 1   insulin glargine (LANTUS SOLOSTAR) 100 UNIT/ML Solostar Pen, Inject 6 Units into the skin daily., Disp: 6 mL, Rfl: 1   Insulin Pen Needle 31G X 5 MM MISC, Use with insulin pens (Patient taking differently: 1 each by Other route See admin instructions. Use with insulin pens), Disp: 100 each, Rfl: 1   Insulin Pen Needle 32G X  4 MM MISC, Use as directed up to 4 times daily., Disp: 100 each, Rfl: 0   isosorbide mononitrate (IMDUR) 30 MG 24 hr tablet, Take 0.5  tablets (15 mg total) by mouth daily., Disp: 15 tablet, Rfl: 1   sevelamer carbonate (RENVELA) 800 MG tablet, Take 1 tablet (800 mg total) by mouth 3 (three) times daily., Disp: 90 tablet, Rfl: 1  Allergies: No Known Allergies   Objective  Vital signs:  Temp:  [97.7 F (36.5 C)-98.8 F (37.1 C)] 97.7 F (36.5 C) (12/05 1427) Pulse Rate:  [65-88] 75 (12/05 1427) Resp:  [16-18] 18 (12/05 1427) BP: (123-189)/(74-124) 123/74 (12/05 1427) SpO2:  [98 %-100 %] 98 % (12/05 1427)  Mental Status Exam   Appearance and Grooming: Patient is laying on his side in hospital bed with eyes closed during most of the interview. The patient has no noticeable scent or odor. Motor activity: The patient's movement speed and his gait was not observed during encounter. There was no notable abnormal facial movements and no notable abnormal extremity movements. Behavior: The patient appears in no acute distress, and during the interview, was somnolent, refusing to talk, and required frequent redirection; he was able to follow commands and compliant to requests and made minimal eye contact. The patient did not appear internally or externally preoccupied. Attitude: Patient's attitude towards the interviewer was uncooperative, as evidenced by intermittently answering questions . Speech: The patient's speech was fluent, with good articulation, and with appropriately placed inflections. The volume of his speech was soft and sparse in quantity. The rate was normal with a normal rhythm. Responses were normal in latency. There were no abnormal patterns in speech. Mood: "Tired" Affect: Patient's affect is blunted with broad range and even fluctuations; his affect is congruent with his stated mood. ------------------------------------------------------------------------------------------------------------------------- Thought Content The patient experiences auditory hallucinations, specifically, little people talking  and visual hallucinations, specifically, little people . The patient describes no delusional thoughts; he denies thought insertion, denies thought withdrawal, denies thought interruption, and denies thought broadcasting. Patient at the time of interview denies active suicidal intent and denies passive suicidal ideation; he denies homicidal intent. Thought Process The patient's thought process is linear and is goal-directed. Insight The patient at the time of interview demonstrates poor insight, as evidenced by inability to identify trigger/s causing mental health decompensation. Judgement The patient over the past 24 hours demonstrates poor judgement, as evidenced by not adhering to medication regimen.  Memory: limited   Executive Functions  Concentration: Intact  Attention Span: Fair, can spell WORLD backwards, follow complex commands, state days of the week backwards  Recall: Poor Fund of Knowledge: Not formally assessed  Alertness/Orientation: somnolent and oriented to person, place, month (but not time of month)  Assets  Assets: Armed forces logistics/support/administrative officer; Desire for Improvement; Financial Resources/Insurance   Sleep  Sleep:No data recorded  Physical Exam Constitutional:      Appearance: the patient is laying on his side in the hospital bed with eyes closed during most of interview.  Pulmonary:     Effort: Pulmonary effort is normal.  Neurological:     General: No focal deficit present.     Mental Status: the patient is somnolent and oriented to person, place, month (but not time of month - states middle)  Review of Systems  Respiratory:  Negative for shortness of breath.   Cardiovascular:  Negative for chest pain.  Gastrointestinal:  Negative for abdominal pain, constipation, diarrhea, nausea and vomiting.  Neurological:  Negative for headaches.  Blood pressure 123/74, pulse 75, temperature 97.7 F (36.5 C), temperature source Oral, resp. rate 18, SpO2 98 %. There is no  height or weight on file to calculate BMI.

## 2022-09-10 NOTE — Progress Notes (Signed)
PROGRESS NOTE    Michael [name]  BMW:[id number] DOB: 1951-03-06 DOA: 09/09/2022 PCP: [name], MD   Brief Narrative:  71 y.o. male with medical history significant of HTN, chronic HFrEF with recovered LVEF 60 to 65%, PAF on Eliquis,, CAD s/p CABG and stenting, high DDM, CKD stage IIIb, HIV on HAART, and cocaine abuse who presents with complaints of left-sided chest pain starting yesterday evening. Patient had just recently been hospitalized from 11/30-12/1 with complains of chest pain.  Patient was recommended to continue aspirin and Plavix, restarted on atorvastatin, and started on Imdur.  He reports that he had not been taking his medications as prescribed since being discharged although he has them. In the emergency department patient was found to have blood pressures elevated up to 179/103 and all other vital signs maintained. He was admitted for AKI Cr 6.3   Assessment & Plan:  Principal Problem:   ARF (acute renal failure) ([name]) Active Problems:   Suicidal ideations   3-vessel CAD   Anemia   Essential hypertension   Paroxysmal A-fib ([name])   (HFpEF) heart failure with preserved ejection fraction ([name])   Elevated troponin   HIV (human immunodeficiency virus infection) ([place])   Cocaine use disorder ([name])   Cocaine abuse, continuous ([place])   History of pulmonary embolism   Uncontrolled type 2 diabetes mellitus with hyperglycemia ([place])    Acute kidney injury superimposed on chronic kidney disease stage IIIb -Admission creatinine 6.3, baseline 2.5.  Morning labs are pending, ordered stat blood work (ordered at [place], still waiting to be results at 2pm- lab and RN has been notified).  UA negative, UDS positive for cocaine.  Renal ultrasound overall unremarkable.  Will continue IV fluids, if renal function fails to improve, consult nephrology   Suicidal ideation depression Patient had thoughts of hurting himself without any obvious plan is expressed to the admitting provider.   Currently sitter in place.  Psychiatry consulted   CAD status post CABG Atypical chest pain Chronic.  Patient has been noncompliant with his medications.  Has been evaluated by cardiology.  Continue aspirin, Plavix and Imdur.   Essential hypertension Resume home medication.  IV as needed ordered   Heart failure with preserved EF Repeat echocardiogram noted EF to be 55% at the end of last month.  Patient appears to be euvolemic at this time.   HIV -Resume HARRT -Recommend outpatient follow up with infectious disease   Paroxysmal atrial fibrillation Patient appears to be in sinus rhythm at this time. -Continue diltiazem   Anemia of chronic kidney disease Hemoglobin 9.6 g/dL which appears similar to prior. -Continue to monitor   Diabetes mellitus type 2, with hyperglycemia Sliding scale and Accu-Chek.  Recent hemoglobin A1c 7.7.   History of PE During recent hospitalization he was advised to discontinue Eliquis.  Currently he is only on aspirin and Plavix  Cocaine abuse Admits of using this day prior to admission.  Counseled to quit using it  DVT prophylaxis: Subcu heparin Code Status: Full code Family Communication:    Maintain hospital stay for management for AKI     Subjective: Seen and examined at bedside.  Patient tells me that he has been noncompliant with outpatient medication.  Does have some orthopnea here   Examination:  General exam: Appears calm and comfortable  Respiratory system: Bibasilar crackles Cardiovascular system: S1 & S2 heard, RRR. No JVD, murmurs, rubs, gallops or clicks. No pedal edema. Gastrointestinal system: Abdomen is nondistended, soft and nontender. No organomegaly or  masses felt. Normal bowel sounds heard. Central nervous system: Alert and oriented. No focal neurological deficits. Extremities: Symmetric 5 x 5 power. Skin: No rashes, lesions or ulcers Psychiatry: Judgement and insight appear normal. Mood & affect appropriate.      Objective: Vitals:   09/09/22 1501 09/09/22 1725 09/09/22 2117 09/10/22 0605  BP: (!) 189/124 (!) 143/86 (!) 149/82 134/76  Pulse: 86 88 75 71  Resp: '16 16 18 18  '$ Temp: 98.4 F (36.9 C)  97.9 F (36.6 C) 98.8 F (37.1 C)  TempSrc: Oral  Oral Oral  SpO2: 98% 98% 98% 100%    Intake/Output Summary (Last 24 hours) at 09/10/2022 0913 Last data filed at 09/10/2022 0160 Gross per 24 hour  Intake 1480 ml  Output 250 ml  Net 1230 ml   There were no vitals filed for this visit.   Data Reviewed:   CBC: Recent Labs  Lab 09/05/22 0505 09/05/22 2036 09/06/22 0040 09/09/22 0843  WBC 7.1  --  4.9 7.8  NEUTROABS 5.3  --   --   --   HGB 9.7* 9.4* 8.8* 9.6*  HCT 30.6* 29.5* 26.5* 31.3*  MCV 87.4  --  83.3 89.4  PLT 157  --  148* 109   Basic Metabolic Panel: Recent Labs  Lab 09/05/22 0505 09/06/22 0040 09/09/22 0843  NA 140 141 135  K 4.9 4.5 5.1  CL 111 111 107  CO2 16* 22 15*  GLUCOSE 197* 93 123*  BUN 39* 38* 81*  CREATININE 2.65* 2.51* 6.32*  CALCIUM 8.7* 8.6* 9.1  PHOS  --  3.7  --    GFR: Estimated Creatinine Clearance: 10.2 mL/min (A) (by C-G formula based on SCr of 6.32 mg/dL (H)). Liver Function Tests: Recent Labs  Lab 09/05/22 0505 09/09/22 0843  AST 27 24  ALT 45* 27  ALKPHOS 81 93  BILITOT 0.3 0.5  PROT 7.1 7.8  ALBUMIN 3.6 4.0   No results for input(s): "LIPASE", "AMYLASE" in the last 168 hours. No results for input(s): "AMMONIA" in the last 168 hours. Coagulation Profile: No results for input(s): "INR", "PROTIME" in the last 168 hours. Cardiac Enzymes: Recent Labs  Lab 09/09/22 1000  CKTOTAL 215   BNP (last 3 results) No results for input(s): "PROBNP" in the last 8760 hours. HbA1C: No results for input(s): "HGBA1C" in the last 72 hours. CBG: Recent Labs  Lab 09/05/22 1724 09/09/22 1708 09/09/22 2055 09/10/22 0641 09/10/22 0732  GLUCAP 236* 142* 169* 139* 155*   Lipid Profile: No results for input(s): "CHOL", "HDL",  "LDLCALC", "TRIG", "CHOLHDL", "LDLDIRECT" in the last 72 hours. Thyroid Function Tests: No results for input(s): "TSH", "T4TOTAL", "FREET4", "T3FREE", "THYROIDAB" in the last 72 hours. Anemia Panel: No results for input(s): "VITAMINB12", "FOLATE", "FERRITIN", "TIBC", "IRON", "RETICCTPCT" in the last 72 hours. Sepsis Labs: No results for input(s): "PROCALCITON", "LATICACIDVEN" in the last 168 hours.  No results found for this or any previous visit (from the past 240 hour(s)).       Radiology Studies: US RENAL  Result Date: 09/09/2022 CLINICAL DATA:  Acute renal failure EXAM: RENAL / URINARY TRACT ULTRASOUND COMPLETE COMPARISON:  None Available. FINDINGS: Right Kidney: Renal measurements: 10.1 x 4.8 x 4.1 = volume: 104 mL. Echogenicity within normal limits. No mass or hydronephrosis visualized. Left Kidney: Renal measurements: 10.0 x 4.3 x 6.0 cm = volume: 112 mL. Echogenicity within normal limits. No mass or hydronephrosis visualized. Bladder: Appears normal for degree of bladder distention. Other: None. IMPRESSION: No significant sonographic  abnormality of the kidneys. Electronically Signed   By: Merilyn Baba M.D.   On: 09/09/2022 12:13   DG Chest 2 View  Result Date: 09/09/2022 CLINICAL DATA:  Chest pain and shortness of breath. EXAM: CHEST - 2 VIEW COMPARISON:  September 05, 2022 FINDINGS: Multiple sternal wires are noted. The heart size and mediastinal contours are within normal limits. A left atrial appendage clip is seen. Both lungs are clear. A radiopaque fusion plate and screws are seen overlying the lower cervical spine. Multilevel degenerative changes are seen throughout the thoracic spine. IMPRESSION: 1. Evidence of prior median sternotomy/CABG. 2. No acute cardiopulmonary disease. Electronically Signed   By: Virgina Norfolk M.D.   On: 09/09/2022 03:24        Scheduled Meds:  aspirin  81 mg Oral Daily   atorvastatin  40 mg Oral Daily   clopidogrel  75 mg Oral Daily    diltiazem  120 mg Oral Daily   dolutegravir-rilpivirine  1 tablet Oral QAC lunch   FLUoxetine  20 mg Oral Daily   gabapentin  300 mg Oral QHS   heparin  5,000 Units Subcutaneous Q8H   insulin aspart  0-5 Units Subcutaneous QHS   insulin aspart  0-9 Units Subcutaneous TID WC   isosorbide mononitrate  15 mg Oral Daily   mometasone-formoterol  2 puff Inhalation BID   sevelamer carbonate  800 mg Oral TID with meals   sodium chloride flush  3 mL Intravenous Q12H   umeclidinium bromide  1 puff Inhalation Daily   Continuous Infusions:  sodium chloride 1,000 mL (09/10/22 0104)     LOS: 0 days   Time spent= 35 mins    Nydia Ytuarte Arsenio Loader, MD [facility]  If 7PM-7AM, please contact night-coverage  09/10/2022, 9:13 AM

## 2022-09-10 NOTE — Progress Notes (Signed)
NEW ADMISSION NOTE New Admission Note:  Patient to floor from ED. He came with chest pain and suicidal ideations.Alert and oriented.  Room air. Ambulatory with assistance. Call light within reach. Arrival Method: Stretcher Mental Orientation:  A & O x 4 Telemetry: yes Assessment: Completed Skin: Intact IV:Lt AC Pain: pain 7/10 Tubes: Safety Measures: Safety Fall Prevention Plan has been given, discussed and implemented. Admission: Completed [facility] [place] Orientation: Patient has been orientated to the room, unit and staff.  Family:  Orders have been reviewed and implemented. Will continue to monitor the patient. Call light has been placed within reach and bed alarm has been activated.   Keenan Bachelor, RN

## 2022-09-11 DIAGNOSIS — B20 Human immunodeficiency virus [HIV] disease: Secondary | ICD-10-CM

## 2022-09-11 DIAGNOSIS — N179 Acute kidney failure, unspecified: Secondary | ICD-10-CM | POA: Diagnosis not present

## 2022-09-11 LAB — BASIC METABOLIC PANEL
Anion gap: 7 (ref 5–15)
BUN: 57 mg/dL — ABNORMAL HIGH (ref 8–23)
CO2: 19 mmol/L — ABNORMAL LOW (ref 22–32)
Calcium: 7.5 mg/dL — ABNORMAL LOW (ref 8.9–10.3)
Chloride: 112 mmol/L — ABNORMAL HIGH (ref 98–111)
Creatinine, Ser: 3.92 mg/dL — ABNORMAL HIGH (ref 0.61–1.24)
GFR, Estimated: 16 mL/min — ABNORMAL LOW (ref >60)
Glucose, Bld: 262 mg/dL — ABNORMAL HIGH (ref 70–99)
Potassium: 4.6 mmol/L (ref 3.5–5.1)
Sodium: 138 mmol/L (ref 135–145)

## 2022-09-11 LAB — CBC
HCT: 22.5 % — ABNORMAL LOW (ref 39.0–52.0)
Hemoglobin: 7.6 g/dL — ABNORMAL LOW (ref 13.0–17.0)
MCH: 27.9 pg (ref 26.0–34.0)
MCHC: 33.8 g/dL (ref 30.0–36.0)
MCV: 82.7 fL (ref 80.0–100.0)
Platelets: 130 10*3/uL — ABNORMAL LOW (ref 150–400)
RBC: 2.72 MIL/uL — ABNORMAL LOW (ref 4.22–5.81)
RDW: 13.9 % (ref 11.5–15.5)
WBC: 4.3 10*3/uL (ref 4.0–10.5)
nRBC: 0 % (ref 0.0–0.2)

## 2022-09-11 LAB — GLUCOSE, CAPILLARY
Glucose-Capillary: 187 mg/dL — ABNORMAL HIGH (ref 70–99)
Glucose-Capillary: 105 mg/dL — ABNORMAL HIGH (ref 70–99)
Glucose-Capillary: 121 mg/dL — ABNORMAL HIGH (ref 70–99)
Glucose-Capillary: 150 mg/dL — ABNORMAL HIGH (ref 70–99)

## 2022-09-11 LAB — HEPATITIS B SURFACE ANTIBODY,QUALITATIVE: Hep B S Ab: NONREACTIVE

## 2022-09-11 LAB — MAGNESIUM: Magnesium: 1.4 mg/dL — ABNORMAL LOW (ref 1.7–2.4)

## 2022-09-11 MED ORDER — ENSURE ENLIVE PO LIQD
237.0000 mL | Freq: Two times a day (BID) | ORAL | Status: DC
  Administered 2022-09-11 – 2022-09-29 (×10): 237 mL via ORAL

## 2022-09-11 MED ORDER — RENA-VITE PO TABS
1.0000 | ORAL_TABLET | Freq: Every day | ORAL | Status: DC
  Administered 2022-09-11 – 2022-10-17 (×37): 1 via ORAL
  Filled 2022-09-11 (×37): qty 1

## 2022-09-11 MED ORDER — MAGNESIUM SULFATE 2 GM/50ML IV SOLN
2.0000 g | Freq: Once | INTRAVENOUS | Status: AC
  Administered 2022-09-11: 2 g via INTRAVENOUS
  Filled 2022-09-11: qty 50

## 2022-09-11 NOTE — Consult Note (Signed)
[facility] for Infectious Disease    Date of Admission:  09/09/2022     Total days of antibiotics 0              Reason for Consult: HIV, not in care, ARF    Referring Provider: Dwyane Dee Primary Care Provider: [name], MD    Assessment: Michael [name] is a 71 y.o. male admitted for acute on chronic renal failure with down-trending creatine now (3.92 now). Has a history of untreated HIV and no regular follow up care. Frequent trips to ER and [place] without outpatient team. He appears quite nomadic and challenging to get a hold of.  Has cocaine use with every other day uptake. Depression with fluctuating SI related to situational stressors, which appear to be chronic.   Was just discharged 12/01 and has had multiple ER / Hospital encounters this year. Very sporadic ARV fills with no proper care. Not clear he has actually been getting his medications. Only 1 possible fill of Juluca in September of this year on record that I can see. However he has been all over with hospital stay in [facility] in November for PNA.  Not clear he has insurance and no active patient assistance without clinic contact. Many of the rx's have been from various hospital providers and visits.  I doubt that he has actually been taking his ARVs but this pattern is of high concern for treatment failure.  Will draw CD4 to restage him and check VL with genotype. I have concerns for emergence of resistance to Rilpivarine if he was taking odefsey previously as sporadically as the chart suggests - would prefer to get him on better regimen if he is interested in staying in care. Will consider Triumeq for him vs Dovato pending genotype - which we can follow outpatient; both would be more "kidney friendly" in his setting of fluctuating creatinines.  Can rx prophylaxis if his CD4 returns low. Check hepatitis serologies and quantiferon gold for TB risk exposure. Urine GC/CT and RPR.   Will plan referral to  bridge counselor with [facility]. I have provided the patient with Mitch's contact information and ask him to reach out. Would favor getting him back to clinic and rx ARVs outpatient for optimal management including review of genotype.   Will come back to talk with him more when he is awake.    Plan: Collect CD4, VL with genotyping, quantiferon gold, hepatitis b/c serologies, RPR and urine cytology  Referral to bridge counselor for outpatient follow up assistance.    Principal Problem:   ARF (acute renal failure) ([name]) Active Problems:   HIV (human immunodeficiency virus infection) ([place])   Cocaine use disorder ([place])   Cocaine abuse, continuous ([place])   3-vessel CAD   Suicidal ideations   Anemia   AKI (acute kidney injury) ([place])   Essential hypertension   History of pulmonary embolism   Paroxysmal A-fib ([place])   Uncontrolled type 2 diabetes mellitus with hyperglycemia ([name])   (HFpEF) heart failure with preserved ejection fraction ([name])   Elevated troponin    aspirin  81 mg Oral Daily   atorvastatin  40 mg Oral Daily   clopidogrel  75 mg Oral Daily   diltiazem  120 mg Oral Daily   dolutegravir-rilpivirine  1 tablet Oral QAC lunch   feeding supplement  237 mL Oral BID BM   FLUoxetine  20 mg Oral Daily   gabapentin  300 mg Oral QHS  heparin  5,000 Units Subcutaneous Q8H   insulin aspart  0-5 Units Subcutaneous QHS   insulin aspart  0-9 Units Subcutaneous TID WC   isosorbide mononitrate  15 mg Oral Daily   mometasone-formoterol  2 puff Inhalation BID   multivitamin  1 tablet Oral QHS   sevelamer carbonate  800 mg Oral TID with meals   sodium chloride flush  3 mL Intravenous Q12H   umeclidinium bromide  1 puff Inhalation Daily    HPI: Michael [name] is a 71 y.o. male admitted to [facility] for management of acute on chronic renal failure.   ID asked to weigh in for HIV treatment and routine follow up care. LOV with Dr. Juleen China in July 2022 - at that time he was off ART for  years with VL 1220 copies and CD4 count 413 (42%). No previous genotypes on file. Previous regimens include Elton Sin (though not clear he was taking this).   Has h/o cocaine use (current) with ER evaluations for chest pain in the past. Homelessness with stays in hotels in 2022. His sister refers to him as "highly transient and nearly impossible to reach." Previous documentation about patient (though no actual clinic encounters with other ID clinics) show sporadic dispenses of medicines but no consistency.  Acute hospitalization with AKI and suicide ideation - psychiatry following. Looking at transition to inpatient substance use rehab after medically cleared.  Baseline Cr undetermined - currently 6.32 and awaiting nehprology evaluation.    Review of Systems: ROS- patient asleep    Past Medical History:  Diagnosis Date   A-fib ([place])    Anemia    Asthma    No PFTs, history of childhood asthma   CAD (coronary artery disease)    Cellulitis 04/2014   left facial   Cellulitis and abscess of toe of right foot 12/08/2019   Chondromalacia of medial femoral condyle    Left knee MRI 04/28/12: Chondromalacia of the medial femoral condyle with slight peripheral degeneration of the meniscocapsular junction of the medial meniscus; followed by sports medicine   Chronic kidney failure, stage 4 (severe) ([place])    Collagen vascular disease ([place])    Crack cocaine use    for 20+ years, has been enrolled in detox programs in the past   Depression    with history of hospitalization for suicidal ideation   Diabetes mellitus 2002   Diagnosed in 2002, started insulin in 2012   Gout    Headache(784.0)    CT head 08/2011: Periventricular and subcortical white matter hypodensities are most in keeping with chronic microangiopathic change   HIV infection ([place]) 08/2011   Followed by [name]   Hyperlipidemia    Hypertension    Pulmonary embolism ([place])     Social History   Tobacco Use   Smoking  status: Never   Smokeless tobacco: Never  Vaping Use   Vaping Use: Never used  Substance Use Topics   Alcohol use: No    Alcohol/week: 4.0 standard drinks of alcohol    Types: 2 Cans of beer, 2 Shots of liquor per week   Drug use: Not Currently    Frequency: 4.0 times per week    Types: "Crack" cocaine, Cocaine    Comment: Recent use of crack    Family History  Problem Relation Age of Onset   Diabetes Mother    Hypertension Mother    Hyperlipidemia Mother    Diabetes Father    Cancer Father    Hypertension  Father    Diabetes Brother    Heart disease Brother    Diabetes Sister    Colon cancer Neg Hx    No Known Allergies  OBJECTIVE: Blood pressure (!) 141/70, pulse 77, temperature 98.6 F (37 C), temperature source Oral, resp. rate 18, SpO2 100 %.  Physical Exam Vitals reviewed.   Sleeping soundly, breathing comfortably.   Lab Results Lab Results  Component Value Date   WBC 4.3 09/11/2022   HGB 7.6 (L) 09/11/2022   HCT 22.5 (L) 09/11/2022   MCV 82.7 09/11/2022   PLT 130 (L) 09/11/2022    Lab Results  Component Value Date   CREATININE 3.92 (H) 09/11/2022   BUN 57 (H) 09/11/2022   NA 138 09/11/2022   K 4.6 09/11/2022   CL 112 (H) 09/11/2022   CO2 19 (L) 09/11/2022    Lab Results  Component Value Date   ALT 27 09/09/2022   AST 24 09/09/2022   ALKPHOS 93 09/09/2022   BILITOT 0.5 09/09/2022     Microbiology: No results found for this or any previous visit (from the past 240 hour(s)).   Janene Madeira, MSN, NP-C [facility] for Infectious Disease [email or web address] Pager: [phone] Office: [phone] [facility] Main Line: [place] Communication Welcome

## 2022-09-11 NOTE — TOC Initial Note (Signed)
Transition of Care [facility]) - Initial/Assessment Note    Patient Details  Name: Michael [name] MRN: [id number] Date of Birth: 22-Jan-1951  [facility] CM/SW Contact:    Tom-Johnson, Renea Ee, RN Phone Number: 09/11/2022, 11:53 AM  Clinical Narrative:                  CM went to speak with patient but patient noted to be fast asleep. Admitted for Acute Renal Failure 2/2 Dehydration. Per MD's note, patient has active Suicidal Ideations and Psychiatry was consulted, waiting their eval and recommendations.  TOC will continue to follow as patient progresses with care.       Barriers to Discharge: Continued Medical Work up   Patient Goals and CMS Choice        Expected Discharge Plan and Services                                                Prior Living Arrangements/Services                       Activities of Daily Living Home Assistive Devices/Equipment: Cane (specify quad or straight) ADL Screening (condition at time of admission) Patient's cognitive ability adequate to safely complete daily activities?: Yes Is the patient deaf or have difficulty hearing?: No Does the patient have difficulty seeing, even when wearing glasses/contacts?: Yes Does the patient have difficulty concentrating, remembering, or making decisions?: Yes Patient able to express need for assistance with ADLs?: Yes Does the patient have difficulty dressing or bathing?: Yes Independently performs ADLs?: Yes (appropriate for developmental age) Does the patient have difficulty walking or climbing stairs?: Yes Weakness of Legs: Both Weakness of Arms/Hands: Both  Permission Sought/Granted                  Emotional Assessment              Admission diagnosis:  ARF (acute renal failure) ([place]) [N17.9] Polysubstance abuse ([place]) [F19.10] AKI (acute kidney injury) ([place]) [N17.9] Patient Active Problem List   Diagnosis Date Noted   ARF (acute renal  failure) ([place]) 09/09/2022   (HFpEF) heart failure with preserved ejection fraction ([place]) 09/09/2022   Elevated troponin 09/09/2022   Impaired functional mobility, balance, and endurance 11/29/2021   Transfusion history 11/29/2021   Unstable balance 11/29/2021   Dilated cardiomyopathy ([place]) 11/29/2021   Arthritis 11/29/2021   Diabetic foot infection ([place]) 06/20/2021   Presence of aortocoronary bypass graft 04/16/2021   Iron deficiency anemia 04/04/2021   Atypical chest pain 04/03/2021   Anemia of chronic disease 12/14/2020   Uncontrolled type 2 diabetes mellitus with hyperglycemia ([place]) 08/14/2020   Idiopathic chronic gout of multiple sites without tophus 02/17/2020   Diabetic ulcer of toe of right foot associated with type 2 diabetes mellitus ([place]) 12/17/2019   Vitamin B12 deficiency 12/17/2019   Homelessness 12/13/2019   Cellulitis 12/07/2019   Osteomyelitis ([place]) 07/17/2019   Abscess or cellulitis of toe, right    Toe pain, right-second    MDD (major depressive disorder), recurrent episode, severe ([place]) 06/15/2019   Respiratory distress 04/12/2019   Pneumonia due to severe acute respiratory syndrome coronavirus 2 (SARS-CoV-2) 04/11/2019   COVID-19 virus infection 03/19/2019   Chest pain 10/03/2018   Malnutrition of moderate degree 09/21/2018   MDD (major depressive disorder), recurrent episode, moderate ([place])  Type II diabetes mellitus with renal manifestations ([place]) 05/04/2018   GERD (gastroesophageal reflux disease) 05/04/2018   S/P CABG (coronary artery bypass graft)    Left testicular pain    Depression 02/20/2018   Epididymo-orchitis, acute 02/20/2018   Essential hypertension 02/20/2018   Precordial chest pain 02/20/2018   AKI (acute kidney injury) ([place]) 02/14/2018   HCAP (healthcare-associated pneumonia) 02/14/2018   History of pulmonary embolism 07/04/2017   Acute hyponatremia 05/11/2017   Hyperglycemia due to type 2 diabetes mellitus ([place]) 05/11/2017   CHF  (congestive heart failure) ([place]) 04/01/2017   Hepatitis C 12/30/2016   Substance induced mood disorder ([place]) 11/23/2016   Chronic ischemic heart disease 11/12/2016   Paroxysmal A-fib ([place]) 11/12/2016   Back pain 04/18/2016   S/P carotid endarterectomy 11/15/2015   Chronic obstructive pulmonary disease, unspecified ([place]) 10/22/2015   Stenosis of cervical spine with myelopathy ([place]) 10/20/2015   MDD (major depressive disorder), recurrent severe, without psychosis ([place]) 09/09/2015   History of fusion of cervical spine 08/28/2015   Cocaine-induced mood disorder ([place]) 08/14/2015   Cocaine abuse with cocaine-induced mood disorder ([place]) 08/14/2015   Gout 07/10/2015   Acute renal failure superimposed on stage 3 chronic kidney disease ([place]) 03/06/2015   Anemia 03/06/2015   Chronic kidney disease, stage III (moderate) ([place]) 03/06/2015   Hypoglycemia    Encounter for general adult medical examination with abnormal findings 02/09/2015   Cocaine use disorder, moderate, dependence ([place]) 12/13/2014   Substance or medication-induced depressive disorder with onset during withdrawal ([place]) 12/13/2014   Severe recurrent major depressive disorder with psychotic features ([place]) 12/12/2014   Major depressive disorder, recurrent, severe without psychotic features ([place])    Suicidal ideations 08/15/2014   Cervicalgia 06/28/2014   Lumbar radiculopathy, chronic 06/28/2014   Asthma, chronic 02/03/2014   S/P percutaneous transluminal coronary angioplasty 10/15/2013   3-vessel CAD 06/24/2013   ED (erectile dysfunction) of organic origin 07/07/2012   Cocaine abuse, continuous ([place]) 05/13/2012   Hypertension goal BP (blood pressure) < 140/80 04/29/2012   Chondromalacia of left knee 03/19/2012   Hyperlipidemia with target LDL less than 100 02/12/2012   Fibromyalgia 02/12/2012   Cocaine use disorder ([place]) 01/10/2012    Class: Acute   HIV (human immunodeficiency virus infection) ([place]) 08/27/2011   Uncontrolled type  2 diabetes with neuropathy 10/17/2000   PCP:  [name], MD Pharmacy:   [name] [address] [place] [address] Phone: [phone] Fax: [place] [address] [place] [address] Phone: [phone] Fax: [place], [place] - [address] [place] [address] Phone: [phone] Fax: [phone]     Social Determinants of Health ([employer]) Interventions    Readmission Risk Interventions    07/05/2022   10:08 AM  Readmission Risk Prevention Plan  Transportation Screening Complete  Medication Review ([place]) Complete  PCP or Specialist appointment within 3-5 days of discharge Complete  HRI or [place] Complete  SW Recovery Care/Counseling Consult Complete  [place] Not Applicable

## 2022-09-11 NOTE — Consult Note (Signed)
[facility] Followup Face-to-Face Psychiatric Evaluation  Service Date: September 11, 2022 LOS:  LOS: 1 day   Assessment  Michael [name] is a 71 y.o. male admitted medically for 09/09/2022  2:38 AM for AKI on CKD stage IIIb. He carries the psychiatric diagnoses of major depressive disorder, cocaine use disorder and has a past medical history of CAD s/p CABG and stenting, HTN, HFrEF with recovered LVEF 60-65%, HIV, T2DM, gout, HLD, PAF on eliquis. Psychiatry was consulted for suicidal ideation by Dr. Reesa Chew.   His current presentation of suicidal ideation with no plan, inconsistency with suicidal ideation and in the setting of recent cocaine use is most consistent with substance-induced mood disorder. He meets criteria for substance-induced mood disorder based on recent worsening mood in the setting of recent cocaine use a week ago. Current outpatient psychotropic medications include prozac 20 and historically he has been non-compliant with medications prior to admission which makes it difficult to assess his response. This is evidenced by his inconsistent fill history. On initial examination, patient does not fully engage with interviewer, reports that he is tired and closes his eyes multiple times. He does have prior diagnosis of major depressive disorder and in the setting of his acute on chronic kidney injury, we could continue his prozac or start zoloft or lexapro. No dosing adjustment for prozac is required in setting of renal insufficiency. Given his current reported SI with no plan, will keep 1:1 sitter for now and re-evaluate need for this as well as further discuss medication regimen with patient tomorrow. Please see plan below for detailed recommendations.   12/6 Patient continues to endorse chronic SI (and noted in chart review) but is currently denying SI and contracting for safety. We recommend discontinuing 1:1 given his chronic SI in the setting of unstable housing, chronic  medical conditions. His depression appears to be stable at this time and would continue his prozac at current dose. He is interested in inpatient rehab for his substance use and we would prefer for him to have a door-to-door to inpatient rehab from this hospitalization.    Diagnoses:  Active Hospital problems: Principal Problem:   ARF (acute renal failure) ([name]) Active Problems:   HIV (human immunodeficiency virus infection) ([name])   Cocaine use disorder ([name])   Cocaine abuse, continuous ([place])   3-vessel CAD   Suicidal ideations   Anemia   AKI (acute kidney injury) ([place])   Essential hypertension   History of pulmonary embolism   Paroxysmal A-fib ([place])   Uncontrolled type 2 diabetes mellitus with hyperglycemia ([name])   (HFpEF) heart failure with preserved ejection fraction ([name])   Elevated troponin    Plan  ## Safety and Observation Level:  - Based on my clinical evaluation, I estimate the patient to be at low acute risk of self harm in the current setting, high chronic risk of self-harm - At this time, we recommend a routine level of observation. This decision is based on my review of the chart including patient's history and current presentation, interview of the patient, mental status examination, and consideration of suicide risk including evaluating suicidal ideation, plan, intent, suicidal or self-harm behaviors, risk factors, and protective factors. This judgment is based on our ability to directly address suicide risk, implement suicide prevention strategies and develop a safety plan while the patient is in the clinical setting. Please contact our team if there is a concern that risk level has changed.   ## Medications:  -- CONTINUE prozac '20mg'$   for depression   ## Medical Decision Making Capacity:  -- Not formally assessed   ## Further Work-up:  -- Per primary   -- most recent EKG on 12/4 had QtC of 411 -- Pertinent labwork reviewed earlier this admission includes: Cr 6.32,  Hgb 9.6, UDS+cocaine, ELFYBOF<75, salicylate<7. Renal U/S normal   ## Disposition:  -- Will continue to follow, prefer patient have door-to-door for inpatient rehab  ## Behavioral / Environmental:  --routine   ##Legal Status -Voluntary  Thank you for this consult request. Recommendations have been communicated to the primary team.  We will follow at this time.   Rolanda Lundborg, MD, PGY-1   Follow-up history  Relevant Aspects of Hospital Course:  Admitted on 09/09/2022 for AKI on CKD stage IIIb.  Patient was recently admitted to [facility] and discharged (12/1) for chest pain rule out. Seen by cardiology who recommended medical management and outpatient follow-up. Noted to have difficulty with adherence and follow-up.    Patient Report:  Patient reports that he started feeling weak on his left side a week before and that he came to the hospital because he was tired and having pain in his kidneys. He reports feeling tired and is closing his eyes and not fully answering questions throughout the interview. He reports that he is sad because of his "lifestyle - being sick all the time." He reports that he is homeless and lives by himself, sleeping at [employer]. He reports prior diagnoses of depression, anxiety. Reports he has been only sleeping 2 hours for the past week. Reports he does go see his outpatient doctors. Reports he has never seen a psychiatrist but has seen a therapist before. He reports he has not been taking his medications. He reports a suicide attempt a year ago when he cut his wrist. When asked about SI, he initially denies current SI and states that his last thought was 2 months ago. When asked about his SI on admission, he then states that he has had SI every day for the past 2 months. He denies any plan.   He reports he last smoked crack cocaine a week ago. He denies other substance use including tobacco, alcohol, marijuana. He reports he previously went to rehab but is unable  to state when.   He reports seeing visual hallucinations "little people" and auditory hallucinations "little people talking." He denies delusions, thought insertion, withdrawal, or broadcasting.   12/6: Patient continues to report that he is tired, did not sleep well last night. Reports decreased appetite. His chest pain comes and goes. Reports his mood is a little better. He also reports that his depression comes and goes. He initially reports that he has SI all through the night and early this morning. On later questioning, reports that it is the same SI that he has had because he feels depressed about his life. He contracts for safety while in the hospital. He denies any plans. He reports last visual hallucinations were 2 nights ago. He denies auditory hallucinations. He reports interest in inpatient rehab. Reports he last went to inpatient rehab at [facility] 3 years ago and completed a program there.   ROS:  As above   Collateral information:  Patient declines for anyone to be contacted.   Psychiatric History:  Information collected from chart review  Patient was at inpatient psychiatric hospital at [facility] from 3/10-3/14/22. At the time, was diagnosed with MDD and discharged with lexapro 10. He was admitted for risk of self-injury and medication non-compliance.  Their note states "Patient has had SI and been admitted 4x within the past 6 months. He will ask for help for his SI and demand to be discharged."   Most recently he was discharged from [facility] on prozac 20. Looks like he was prescribed this from [facility] in 7/25. He did not refill until 9/21. Next fill was on 12/1 from [facility] health.   He has been consulted on by TTS at [facility] health, notably on 11/04/2021, 11/12/21.  Family psych history: None noted in chart   Social History:  Tobacco use: Denies Alcohol use: Denies Drug use: Reports cocaine use. Last use a week ago. Has been on and off for the past 30 years.   Completed up to  12th grade.  He is widowed.  Has 2 children (per chart review) Reports he is on disability, lives by himself and is homeless but sleeps at hotels.  Grew up in [place], [place] then went to [place], then got a bus back to [place].   Family History:  The patient's family history includes Cancer in his father; Diabetes in his brother, father, mother, and sister; Heart disease in his brother; Hyperlipidemia in his mother; Hypertension in his father and mother.  Medical History: Past Medical History:  Diagnosis Date   A-fib ([place])    Anemia    Asthma    No PFTs, history of childhood asthma   CAD (coronary artery disease)    Cellulitis 04/2014   left facial   Cellulitis and abscess of toe of right foot 12/08/2019   Chondromalacia of medial femoral condyle    Left knee MRI 04/28/12: Chondromalacia of the medial femoral condyle with slight peripheral degeneration of the meniscocapsular junction of the medial meniscus; followed by sports medicine   Chronic kidney failure, stage 4 (severe) ([place])    Collagen vascular disease ([place])    Crack cocaine use    for 20+ years, has been enrolled in detox programs in the past   Depression    with history of hospitalization for suicidal ideation   Diabetes mellitus 2002   Diagnosed in 2002, started insulin in 2012   Gout    Headache(784.0)    CT head 08/2011: Periventricular and subcortical white matter hypodensities are most in keeping with chronic microangiopathic change   HIV infection ([place]) 08/2011   Followed by [name]   Hyperlipidemia    Hypertension    Pulmonary embolism [facility])     Surgical History: Past Surgical History:  Procedure Laterality Date   AMPUTATION Right 07/21/2019   Procedure: RIGHT SECOND TOE AMPUTATION;  Surgeon: [name], MD;  Location: [place];  Service: Orthopedics;  Laterality: Right;   BACK SURGERY     1988   BOWEL RESECTION     CARDIAC SURGERY     CERVICAL SPINE SURGERY     " rods in my neck "   CORONARY ARTERY  BYPASS GRAFT     CORONARY STENT PLACEMENT     NM MYOCAR PERF WALL MOTION  12/27/2011   normal   SPINE SURGERY      Medications:   Current Facility-Administered Medications:    0.9 %  sodium chloride infusion, , Intravenous, Continuous, Smith, Rondell A, MD, Last Rate: 100 mL/hr at 09/11/22 0341, New Bag at 09/11/22 0341   acetaminophen (TYLENOL) tablet 650 mg, 650 mg, Oral, Q6H PRN **OR** acetaminophen (TYLENOL) suppository 650 mg, 650 mg, Rectal, Q6H PRN, Tamala Julian, Rondell A, MD   aspirin chewable tablet 81 mg,  81 mg, Oral, Daily, Tamala Julian, Rondell A, MD, 81 mg at 09/11/22 0830   atorvastatin (LIPITOR) tablet 40 mg, 40 mg, Oral, Daily, Tamala Julian, Rondell A, MD, 40 mg at 09/11/22 0831   clopidogrel (PLAVIX) tablet 75 mg, 75 mg, Oral, Daily, Tamala Julian, Rondell A, MD, 75 mg at 09/11/22 0831   diltiazem (CARDIZEM CD) 24 hr capsule 120 mg, 120 mg, Oral, Daily, Tamala Julian, Rondell A, MD, 120 mg at 09/11/22 0831   dolutegravir-rilpivirine (JULUCA) 50-25 MG per tablet 1 tablet, 1 tablet, Oral, QAC lunch, Fuller Plan A, MD, 1 tablet at 09/10/22 1118   FLUoxetine (PROZAC) capsule 20 mg, 20 mg, Oral, Daily, Tamala Julian, Rondell A, MD, 20 mg at 09/11/22 9678   gabapentin (NEURONTIN) capsule 300 mg, 300 mg, Oral, QHS, Smith, Rondell A, MD, 300 mg at 09/10/22 2309   guaiFENesin (ROBITUSSIN) 100 MG/5ML liquid 5 mL, 5 mL, Oral, Q4H PRN, Amin, Ankit Chirag, MD   heparin injection 5,000 Units, 5,000 Units, Subcutaneous, Q8H, Fuller Plan A, MD, 5,000 Units at 09/11/22 0559   hydrALAZINE (APRESOLINE) injection 10 mg, 10 mg, Intravenous, Q4H PRN, Amin, Ankit Chirag, MD   insulin aspart (novoLOG) injection 0-5 Units, 0-5 Units, Subcutaneous, QHS, Smith, Rondell A, MD   insulin aspart (novoLOG) injection 0-9 Units, 0-9 Units, Subcutaneous, TID WC, Smith, Rondell A, MD, 2 Units at 09/11/22 0827   ipratropium-albuterol (DUONEB) 0.5-2.5 (3) MG/3ML nebulizer solution 3 mL, 3 mL, Nebulization, Q4H PRN, Amin, Ankit Chirag, MD   isosorbide  mononitrate (IMDUR) 24 hr tablet 15 mg, 15 mg, Oral, Daily, Tamala Julian, Rondell A, MD, 15 mg at 09/11/22 0831   magnesium sulfate IVPB 2 g 50 mL, 2 g, Intravenous, Once, Shawna Clamp, MD   metoprolol tartrate (LOPRESSOR) injection 5 mg, 5 mg, Intravenous, Q4H PRN, Amin, Ankit Chirag, MD   mometasone-formoterol (DULERA) 200-5 MCG/ACT inhaler 2 puff, 2 puff, Inhalation, BID, Tamala Julian, Rondell A, MD, 2 puff at 09/10/22 0800   ondansetron (ZOFRAN) tablet 4 mg, 4 mg, Oral, Q6H PRN **OR** ondansetron (ZOFRAN) injection 4 mg, 4 mg, Intravenous, Q6H PRN, Tamala Julian, Rondell A, MD   senna-docusate (Senokot-S) tablet 1 tablet, 1 tablet, Oral, QHS PRN, Amin, Ankit Chirag, MD   sevelamer carbonate (RENVELA) tablet 800 mg, 800 mg, Oral, TID with meals, Tamala Julian, Rondell A, MD, 800 mg at 09/11/22 0831   sodium chloride flush (NS) 0.9 % injection 3 mL, 3 mL, Intravenous, Q12H, Smith, Rondell A, MD, 3 mL at 09/11/22 0833   traZODone (DESYREL) tablet 50 mg, 50 mg, Oral, QHS PRN, Amin, Ankit Chirag, MD, 50 mg at 09/11/22 0002   umeclidinium bromide (INCRUSE ELLIPTA) 62.5 MCG/ACT 1 puff, 1 puff, Inhalation, Daily, Tamala Julian, Rondell A, MD, 1 puff at 09/10/22 0801  Allergies: No Known Allergies   Objective  Vital signs:  Temp:  [97.7 F (36.5 C)-98.6 F (37 C)] 98.6 F (37 C) (12/06 0831) Pulse Rate:  [66-77] 77 (12/06 0831) Resp:  [18] 18 (12/05 2110) BP: (114-141)/(70-74) 141/70 (12/06 0831) SpO2:  [98 %-100 %] 100 % (12/06 0831)  Mental Status Exam   Appearance and Grooming: Patient is laying on his side in hospital bed with eyes closed during most of the interview. The patient has no noticeable scent or odor. Motor activity: The patient's movement speed and his gait was not observed during encounter. There was no notable abnormal facial movements and no notable abnormal extremity movements. Behavior: The patient appears in no acute distress, and during the interview, was calm; he was able to follow commands and  compliant to requests and made minimal eye contact. The patient did not appear internally or externally preoccupied. Attitude: Patient's attitude towards the interviewer was cooperative. Speech: The patient's speech was fluent, with good articulation, and with appropriately placed inflections. The volume of his speech was soft and sparse in quantity. The rate was normal with a normal rhythm. Responses were normal in latency. There were no abnormal patterns in speech. Mood: "Better" Affect: Patient's affect is blunted with broad range and even fluctuations; his affect is congruent with his stated mood. ------------------------------------------------------------------------------------------------------------------------- Thought Content The patient experiences no hallucinations. The patient describes no delusional thoughts; he denies thought insertion, denies thought withdrawal, denies thought interruption, and denies thought broadcasting. Patient at the time of interview denies active suicidal intent and denies passive suicidal ideation; he denies homicidal intent. Thought Process The patient's thought process is linear and is goal-directed. Insight The patient at the time of interview demonstrates fair insight  Judgement The patient over the past 24 hours demonstrates fair judgement  Memory: limited   Executive Functions  Concentration: Intact  Attention Span: Fair Recall: Poor Fund of Knowledge: Not formally assessed  Alertness/Orientation: oriented to person, place, month   Assets  Assets: Armed forces logistics/support/administrative officer; Desire for Improvement; Financial Resources/Insurance  Sleep  Sleep:No data recorded  Physical Exam Constitutional:      Appearance: the patient is laying on his side in the hospital bed with eyes closed during most of interview.  Pulmonary:     Effort: Pulmonary effort is normal.  Neurological:     General: No focal deficit present.     Mental Status: the  patient is oriented to person, place, month   Review of Systems  Respiratory:  Negative for shortness of breath.   Cardiovascular:  Negative for chest pain.  Gastrointestinal:  Negative for abdominal pain, constipation, diarrhea, nausea and vomiting.  Neurological:  Negative for headaches.   Blood pressure (!) 141/70, pulse 77, temperature 98.6 F (37 C), temperature source Oral, resp. rate 18, SpO2 100 %. There is no height or weight on file to calculate BMI.

## 2022-09-11 NOTE — Progress Notes (Signed)
Initial Nutrition Assessment  DOCUMENTATION CODES:   Non-severe (moderate) malnutrition in context of chronic illness  INTERVENTION:   - Ensure Enlive po BID, each supplement provides 350 kcal and 20 grams of protein  - Liberalize diet to Regular to promote PO intake  - Renal MVI daily  NUTRITION DIAGNOSIS:   Moderate Malnutrition related to chronic illness (CHF, CKD, HIV) as evidenced by moderate fat depletion, severe muscle depletion.  GOAL:   Patient will meet greater than or equal to 90% of their needs  MONITOR:   Supplement acceptance, Labs, PO intake, Weight trends  REASON FOR ASSESSMENT:   Malnutrition Screening Tool    ASSESSMENT:   71 year old male who presented to the ED on 12/04 with chest pain. PMH of HTN, CHF, PAF, CAD s/p CABG and stenting, CKD stage IIIb, HIV on HAART, cocaine abuse, T2DM. Pt admitted with AKI on CKD, suicidal ideation, chest pain.  Per review of notes, pt with history of cocaine use (current) and homelessness with stays in hotels in 2022. Potential transition to inpatient substance use rehab after medically cleared.  Spoke with pt at bedside. Pt sleeping soundly at RD visit but awoke to RD voice. Pt reports decreased appetite at this time. He states that he ate "some" of his lunch but not all of it.  Pt reports typically eating 3 meals daily and snacks between meals. Breakfast usually includes some type of cereal. Lunch and dinner are usually a sandwich with meat, cheese, and mayonnaise. Pt denies any recent changes in his appetite.  Pt endorses weight loss but is unsure of why he is losing weight. He reports that his UBW is 170 lbs and that he is down to 160 lbs. Reviewed weight history in chart. Pt with a 3 kg weight loss from 08/10/22 to 09/06/22. This is a 4.1% weight loss in 1 month which is not clinically significant for timeframe but is concerning. Pt meets criteria for moderate chronic malnutrition.  Pt reports that he has tried Ensure  supplements in the past but they caused his blood sugars to spike. Given reports of decreased appetite as well as malnutrition, will order Ensure Enlive to provide more kcal and protein. Can consider switching to Glucerna if CBG's are significantly elevated after pt consumes Ensure. RD will also liberalize diet to Regular to provide maximum food options. Will also order daily renal MVI given CKD stage IIIb.  Discussed with pt the importance of increasing PO Intake to promote healing and to maintain lean muscle mass. Pt expresses understanding.  Meal Completion: 75-100%  Medications reviewed and include: SSI, renvela 800 mg TID, IV magnesium sulfate 2 grams x 1 IVF: NS @ 100 ml/hr  Labs reviewed: BUN 57, creatinine 3.92, magnesium 1.4, hemoglobin 7.6, platelets 130 CBG's: 105-213 x 24 hours  UOP: 950 ml x 24 hours I/O's: +2.5 L since admit  NUTRITION - FOCUSED PHYSICAL EXAM:  Flowsheet Row Most Recent Value  Orbital Region Moderate depletion  Upper Arm Region Severe depletion  Thoracic and Lumbar Region Moderate depletion  Buccal Region Mild depletion  Temple Region Mild depletion  Clavicle Bone Region Severe depletion  Clavicle and Acromion Bone Region Severe depletion  Scapular Bone Region Moderate depletion  Dorsal Hand Moderate depletion  Patellar Region Severe depletion  Anterior Thigh Region Severe depletion  Posterior Calf Region Moderate depletion  Edema (RD Assessment) None  Hair Reviewed  Eyes Reviewed  Mouth Reviewed  [poor dentition]  Skin Reviewed  Nails Reviewed  Diet Order:   Diet Order             Diet regular Room service appropriate? Yes; Fluid consistency: Thin  Diet effective now                   EDUCATION NEEDS:   Education needs have been addressed  Skin:  Skin Assessment: Reviewed RN Assessment  Last BM:  09/08/22  Height:   Ht Readings from Last 1 Encounters:  09/05/22 '5\' 8"'$  (1.727 m)    Weight:   Wt Readings from Last 1  Encounters:  09/06/22 69.6 kg    BMI:  23.32 kg/m2  Estimated Nutritional Needs:   Kcal:  2000-2200  Protein:  95-110 grams  Fluid:  >2.0 L    Gustavus Bryant, MS, RD, LDN Inpatient Clinical Dietitian Please see AMiON for contact information.

## 2022-09-11 NOTE — Progress Notes (Signed)
PROGRESS NOTE    Michael [name]  WYO:[id number] DOB: 09/08/1951 DOA: 09/09/2022  PCP: [name], MD   Brief Narrative:  This 71 yrs old Male with PMH significant of HTN, chronic HFrEF with recovered LVEF 60 to 65%, PAF on Eliquis, CAD s/p CABG and stenting, DM, CKD stage IIIb, HIV on HAART, and cocaine abuse who presented in the ED with complaints of left-sided chest pain for one day. Patient had just recently been hospitalized from 11/30-12/1/23 with complains of chest pain. Patient was recommended to continue aspirin and Plavix, Imdur and restarted on atorvastatin.  He reports that he had not been taking his medications as prescribed since being discharged although he has them. He is admitted for AKI with Cr 6.3.  Patient has active suicidal ideations and psychiatry was consulted.   Assessment & Plan:   Principal Problem:   ARF (acute renal failure) ([name]) Active Problems:   Suicidal ideations   3-vessel CAD   Anemia   Essential hypertension   Paroxysmal A-fib ([name])   (HFpEF) heart failure with preserved ejection fraction ([name])   Elevated troponin   HIV (human immunodeficiency virus infection) ([place])   Cocaine use disorder ([name])   Cocaine abuse, continuous ([place])   History of pulmonary embolism   Uncontrolled type 2 diabetes mellitus with hyperglycemia ([place])   AKI (acute kidney injury) ([place])  AKI on CKD stage IIIb: Baseline serum creatinine around 2.5. Creatinine on admission 6.3. UA: Negative.  Urine drug screen cocaine +. Renal ultrasound unremarkable. Continue IV fluids. Renal functions improving, Creatinine 6.3 > 4.63> 4.42> 3.92  Suicidal ideations in the setting of depression: Patient had thoughts of hurting himself without any obvious plan. Continue one-to-one sitter.  Psychiatry is consulted. Continue Prozac 20 mg for depression. Psychiatry will follow.   CAD s/p CABG: Chronic atypical chest pain: Patient has been noncompliant with his medications. He has  been evaluated by cardiology. He was advised to continue aspirin,  Plavix and Imdur.   Essential hypertension: Continue Cardizem CD120 mg daily Continue hydralazine IV as needed.   Heart failure with preserved EF: Recent echo shows LVEF 55%. No RWMA Appears euvolemic at this time.  HIV+ Resume HAART Infectious disease consulted for resumption of HAART.   Paroxysmal atrial fibrillation: Heart rate well controlled and remains in sinus rhythm. Continue diltiazem CD 120 mg daily.   Anemia of chronic disease: Hemoglobin 9.6 which seems his baseline. Continue to monitor.   Diabetes mellitus type 2 with hyperglycemia: Hemoglobin A1c 7.7. Continue regular insulin sliding scale.   History of PE: During recent hospitalization he was advised to discontinue Eliquis. Continue aspirin and Plavix.  Cocaine abuse: Patient admits of using cocaine 1 day prior to admission. Counseled to quit using it. Social worker consult  DVT prophylaxis: Heparin sq Code Status: Full code Family Communication: No family at bed side. Disposition Plan:   Status is: Inpatient Remains inpatient appropriate because:  AKI on CKD stage IIIb, suicidal ideation.  Psychiatry is consulted.  Patient is not medically clear for discharge.   Consultants:  Psychiatry  Procedures: None Antimicrobials: None  Subjective: Patient seen and examined at bedside.  Overnight events noted. Patient remains comfortable,  states he has been noncompliant with outpatient medications. Patient reports using cocaine,  states still has suicidal thoughts.  Objective: Vitals:   09/10/22 1644 09/10/22 2110 09/11/22 0830 09/11/22 0831  BP:  114/72  (!) 141/70  Pulse:  72 75 77  Resp:  18    Temp:  98.6  F (37 C)  98.6 F (37 C)  TempSrc: Oral Oral  Oral  SpO2:  100% 100% 100%    Intake/Output Summary (Last 24 hours) at 09/11/2022 1137 Last data filed at 09/11/2022 0737 Gross per 24 hour  Intake 2151.42 ml  Output  1250 ml  Net 901.42 ml   There were no vitals filed for this visit.  Examination:  General exam: Appears comfortable, not in any acute distress.  Deconditioned Respiratory system: CTA bilaterally, respiratory effort normal, RR 15 Cardiovascular system: S1 & S2 heard, regular rate and rhythm, no murmur. Gastrointestinal system: Abdomen is soft, non tender, non distended, BS +. Central nervous system: Alert and oriented x 3. No focal neurological deficits. Extremities: No edema, no cyanosis, no clubbing Skin: No rashes, lesions or ulcers Psychiatry: Judgement and insight appear normal. Mood & affect appropriate.     Data Reviewed: I have personally reviewed following labs and imaging studies  CBC: Recent Labs  Lab 09/05/22 0505 09/05/22 2036 09/06/22 0040 09/09/22 0843 09/10/22 0913 09/11/22 0434  WBC 7.1  --  4.9 7.8 5.3 4.3  NEUTROABS 5.3  --   --   --   --   --   HGB 9.7* 9.4* 8.8* 9.6* 7.9* 7.6*  HCT 30.6* 29.5* 26.5* 31.3* 24.9* 22.5*  MCV 87.4  --  83.3 89.4 87.1 82.7  PLT 157  --  148* 155 124* 947*   Basic Metabolic Panel: Recent Labs  Lab 09/06/22 0040 09/09/22 0843 09/10/22 0913 09/10/22 1426 09/11/22 0434  NA 141 135 136 136 138  K 4.5 5.1 4.4 4.7 4.6  CL 111 107 111 112* 112*  CO2 22 15* 19* 17* 19*  GLUCOSE 93 123* 172* 187* 262*  BUN 38* 81* 68* 64* 57*  CREATININE 2.51* 6.32* 4.63* 4.42* 3.92*  CALCIUM 8.6* 9.1 7.8* 7.9* 7.5*  MG  --   --   --   --  1.4*  PHOS 3.7  --   --   --   --    GFR: Estimated Creatinine Clearance: 16.5 mL/min (A) (by C-G formula based on SCr of 3.92 mg/dL (H)). Liver Function Tests: Recent Labs  Lab 09/05/22 0505 09/09/22 0843  AST 27 24  ALT 45* 27  ALKPHOS 81 93  BILITOT 0.3 0.5  PROT 7.1 7.8  ALBUMIN 3.6 4.0   No results for input(s): "LIPASE", "AMYLASE" in the last 168 hours. No results for input(s): "AMMONIA" in the last 168 hours. Coagulation Profile: No results for input(s): "INR", "PROTIME" in the  last 168 hours. Cardiac Enzymes: Recent Labs  Lab 09/09/22 1000  CKTOTAL 215   BNP (last 3 results) No results for input(s): "PROBNP" in the last 8760 hours. HbA1C: No results for input(s): "HGBA1C" in the last 72 hours. CBG: Recent Labs  Lab 09/10/22 1115 09/10/22 1358 09/10/22 1658 09/10/22 2215 09/11/22 0714  GLUCAP 213* 180* 198* 128* 187*   Lipid Profile: No results for input(s): "CHOL", "HDL", "LDLCALC", "TRIG", "CHOLHDL", "LDLDIRECT" in the last 72 hours. Thyroid Function Tests: No results for input(s): "TSH", "T4TOTAL", "FREET4", "T3FREE", "THYROIDAB" in the last 72 hours. Anemia Panel: No results for input(s): "VITAMINB12", "FOLATE", "FERRITIN", "TIBC", "IRON", "RETICCTPCT" in the last 72 hours. Sepsis Labs: No results for input(s): "PROCALCITON", "LATICACIDVEN" in the last 168 hours.  No results found for this or any previous visit (from the past 240 hour(s)).   Radiology Studies: US RENAL  Result Date: 09/09/2022 CLINICAL DATA:  Acute renal failure EXAM: RENAL / URINARY TRACT  ULTRASOUND COMPLETE COMPARISON:  None Available. FINDINGS: Right Kidney: Renal measurements: 10.1 x 4.8 x 4.1 = volume: 104 mL. Echogenicity within normal limits. No mass or hydronephrosis visualized. Left Kidney: Renal measurements: 10.0 x 4.3 x 6.0 cm = volume: 112 mL. Echogenicity within normal limits. No mass or hydronephrosis visualized. Bladder: Appears normal for degree of bladder distention. Other: None. IMPRESSION: No significant sonographic abnormality of the kidneys. Electronically Signed   By: Merilyn Baba M.D.   On: 09/09/2022 12:13     Scheduled Meds:  aspirin  81 mg Oral Daily   atorvastatin  40 mg Oral Daily   clopidogrel  75 mg Oral Daily   diltiazem  120 mg Oral Daily   dolutegravir-rilpivirine  1 tablet Oral QAC lunch   FLUoxetine  20 mg Oral Daily   gabapentin  300 mg Oral QHS   heparin  5,000 Units Subcutaneous Q8H   insulin aspart  0-5 Units Subcutaneous QHS    insulin aspart  0-9 Units Subcutaneous TID WC   isosorbide mononitrate  15 mg Oral Daily   mometasone-formoterol  2 puff Inhalation BID   sevelamer carbonate  800 mg Oral TID with meals   sodium chloride flush  3 mL Intravenous Q12H   umeclidinium bromide  1 puff Inhalation Daily   Continuous Infusions:  sodium chloride 100 mL/hr at 09/11/22 0341     LOS: 1 day    Time spent: 50 mins    Michael Thomley, MD [facility]   If 7PM-7AM, please contact night-coverage

## 2022-09-12 ENCOUNTER — Other Ambulatory Visit (HOSPITAL_COMMUNITY): Payer: Self-pay

## 2022-09-12 ENCOUNTER — Other Ambulatory Visit: Payer: Self-pay | Admitting: Infectious Diseases

## 2022-09-12 ENCOUNTER — Telehealth: Payer: Self-pay

## 2022-09-12 DIAGNOSIS — B20 Human immunodeficiency virus [HIV] disease: Secondary | ICD-10-CM

## 2022-09-12 DIAGNOSIS — N179 Acute kidney failure, unspecified: Secondary | ICD-10-CM | POA: Diagnosis not present

## 2022-09-12 LAB — BASIC METABOLIC PANEL
Anion gap: 4 — ABNORMAL LOW (ref 5–15)
BUN: 39 mg/dL — ABNORMAL HIGH (ref 8–23)
CO2: 17 mmol/L — ABNORMAL LOW (ref 22–32)
Calcium: 7.9 mg/dL — ABNORMAL LOW (ref 8.9–10.3)
Chloride: 118 mmol/L — ABNORMAL HIGH (ref 98–111)
Creatinine, Ser: 3.22 mg/dL — ABNORMAL HIGH (ref 0.61–1.24)
GFR, Estimated: 20 mL/min — ABNORMAL LOW (ref >60)
Glucose, Bld: 252 mg/dL — ABNORMAL HIGH (ref 70–99)
Potassium: 4.8 mmol/L (ref 3.5–5.1)
Sodium: 139 mmol/L (ref 135–145)

## 2022-09-12 LAB — GLUCOSE, CAPILLARY
Glucose-Capillary: 157 mg/dL — ABNORMAL HIGH (ref 70–99)
Glucose-Capillary: 131 mg/dL — ABNORMAL HIGH (ref 70–99)
Glucose-Capillary: 200 mg/dL — ABNORMAL HIGH (ref 70–99)
Glucose-Capillary: 139 mg/dL — ABNORMAL HIGH (ref 70–99)

## 2022-09-12 LAB — CBC
HCT: 21.9 % — ABNORMAL LOW (ref 39.0–52.0)
Hemoglobin: 7.5 g/dL — ABNORMAL LOW (ref 13.0–17.0)
MCH: 28.3 pg (ref 26.0–34.0)
MCHC: 34.2 g/dL (ref 30.0–36.0)
MCV: 82.6 fL (ref 80.0–100.0)
Platelets: 120 10*3/uL — ABNORMAL LOW (ref 150–400)
RBC: 2.65 MIL/uL — ABNORMAL LOW (ref 4.22–5.81)
RDW: 14 % (ref 11.5–15.5)
WBC: 4.6 10*3/uL (ref 4.0–10.5)
nRBC: 0 % (ref 0.0–0.2)

## 2022-09-12 LAB — MAGNESIUM: Magnesium: 2.1 mg/dL (ref 1.7–2.4)

## 2022-09-12 LAB — HEPATITIS C ANTIBODY: HCV Ab: NONREACTIVE

## 2022-09-12 LAB — HEPATITIS B SURFACE ANTIGEN: Hepatitis B Surface Ag: NONREACTIVE

## 2022-09-12 MED ORDER — HYDRALAZINE HCL 25 MG PO TABS
25.0000 mg | ORAL_TABLET | Freq: Three times a day (TID) | ORAL | Status: DC
  Administered 2022-09-12 – 2022-09-13 (×4): 25 mg via ORAL
  Filled 2022-09-12 (×4): qty 1

## 2022-09-12 MED ORDER — MAGNESIUM SULFATE 2 GM/50ML IV SOLN
2.0000 g | Freq: Once | INTRAVENOUS | Status: AC
  Administered 2022-09-12: 2 g via INTRAVENOUS
  Filled 2022-09-12: qty 50

## 2022-09-12 NOTE — Progress Notes (Signed)
[name] for Infectious Disease  Date of Admission:  09/09/2022      Total days of antibiotics 0          ASSESSMENT: Michael [name] is a 71 y.o. male with presumably untreated HIV infection (last VL > 1000, CD4 > 200 in June 2022) admitted with a/c renal failure. Creatinine down trending now.  More awake today. He says that he would be happy to work with our team and come to clinic regularly. Has no phone contact, but he does get mail sent to [facility]. He has housing insecurities, substance use (cocaine) and depression. Psychiatry recommending transfer to inpatient substance rehab after medical stay is done.   We talked about setting up outpatient follow up to review genotype and other labs drawn so we can pick a medication that will work well for him. Juluca, while renally "friendly" requires strictly a full chewable meal to absorb, of which he does not always have consistently availible to him. Hep b sAg pending - Dovato vs Triumeq may be better choice. HLA B*[id number] was negative in 2013.  He is OK with stopping his HIV medication now so we can re-establish a treatment plan and consistent follow up.   I gave him our bridge counselor's card and asked him to call him today from hospital room. Will also discuss with Mitch about referral and needs. Can send mail to him at [facility].   Most of his labs were not drawn for some reason - will talk with his nurse to figure out why.    PLAN: FU on CD4 for prophylaxis recs (not drawn yet) FU all labs yet to be drawn  FU on genotype outpatient in 2 weeks in [place] clinic with me and financial advisors 12/20 arranged 2:00 pm.  He will call bridge counselor and we will coordinate referral for assistance.     Principal Problem:   ARF (acute renal failure) ([name]) Active Problems:   HIV (human immunodeficiency virus infection) ([place])   Cocaine use disorder ([name])   Cocaine abuse, continuous ([place])   3-vessel CAD   Suicidal ideations    Anemia   AKI (acute kidney injury) ([place])   Essential hypertension   History of pulmonary embolism   Paroxysmal A-fib ([name])   Uncontrolled type 2 diabetes mellitus with hyperglycemia ([name])   (HFpEF) heart failure with preserved ejection fraction ([name])   Elevated troponin    aspirin  81 mg Oral Daily   atorvastatin  40 mg Oral Daily   clopidogrel  75 mg Oral Daily   diltiazem  120 mg Oral Daily   feeding supplement  237 mL Oral BID BM   FLUoxetine  20 mg Oral Daily   gabapentin  300 mg Oral QHS   heparin  5,000 Units Subcutaneous Q8H   hydrALAZINE  25 mg Oral Q8H   insulin aspart  0-5 Units Subcutaneous QHS   insulin aspart  0-9 Units Subcutaneous TID WC   isosorbide mononitrate  15 mg Oral Daily   mometasone-formoterol  2 puff Inhalation BID   multivitamin  1 tablet Oral QHS   sevelamer carbonate  800 mg Oral TID with meals   sodium chloride flush  3 mL Intravenous Q12H   umeclidinium bromide  1 puff Inhalation Daily    SUBJECTIVE: Pleasant. Resting comfortably.   Review of Systems: Review of Systems  Constitutional:  Negative for chills and fever.  HENT:  Negative for tinnitus.   Eyes:  Negative for blurred vision and photophobia.  Respiratory:  Negative for cough and sputum production.   Cardiovascular:  Negative for chest pain.  Gastrointestinal:  Negative for diarrhea, nausea and vomiting.  Genitourinary:  Negative for dysuria.  Skin:  Negative for rash.  Neurological:  Negative for headaches.    No Known Allergies  OBJECTIVE: Vitals:   09/12/22 0520 09/12/22 0758 09/12/22 0800 09/12/22 0833  BP: (!) 163/90  (!) 186/90   Pulse: 79  77   Resp: 18  18   Temp: 99 F (37.2 C)  98.4 F (36.9 C)   TempSrc: Oral  Oral   SpO2: 100%  100% 99%  Weight:  69 kg    Height:       Body mass index is 23.13 kg/m.  Physical Exam Vitals reviewed.  Constitutional:      Appearance: Normal appearance. He is not ill-appearing.  HENT:     Head: Normocephalic.      Mouth/Throat:     Mouth: Mucous membranes are moist.     Pharynx: Oropharynx is clear.  Eyes:     General: No scleral icterus. Cardiovascular:     Rate and Rhythm: Normal rate and regular rhythm.  Pulmonary:     Effort: Pulmonary effort is normal.  Musculoskeletal:        General: Normal range of motion.     Cervical back: Normal range of motion.  Skin:    Coloration: Skin is not jaundiced or pale.  Neurological:     Mental Status: He is alert and oriented to person, place, and time.  Psychiatric:        Mood and Affect: Mood normal.        Judgment: Judgment normal.     Lab Results Lab Results  Component Value Date   WBC 4.3 09/11/2022   HGB 7.6 (L) 09/11/2022   HCT 22.5 (L) 09/11/2022   MCV 82.7 09/11/2022   PLT 130 (L) 09/11/2022    Lab Results  Component Value Date   CREATININE 3.92 (H) 09/11/2022   BUN 57 (H) 09/11/2022   NA 138 09/11/2022   K 4.6 09/11/2022   CL 112 (H) 09/11/2022   CO2 19 (L) 09/11/2022    Lab Results  Component Value Date   ALT 27 09/09/2022   AST 24 09/09/2022   ALKPHOS 93 09/09/2022   BILITOT 0.5 09/09/2022     Microbiology: No results found for this or any previous visit (from the past 240 hour(s)).   Janene Madeira, MSN, NP-C [facility] [email or web address] Pager: [phone] Office: [phone] [facility] Main Line: [place] Communication Welcome

## 2022-09-12 NOTE — Telephone Encounter (Signed)
-----   Message from [place] Callas, NP sent at 09/12/2022  2:07 PM EST ----- Hi Team -   Can we please get this patient referred to [facility] [place]? I can go ahead and place the order for [facility] referral so he can see the chart.   Thanks!

## 2022-09-12 NOTE — Progress Notes (Signed)
PROGRESS NOTE    Michael [name]  KXF:[id number] DOB: December 14, 1950 DOA: 09/09/2022  PCP: [name], MD   Brief Narrative:  This 71 yrs old Male with PMH significant of HTN, chronic HFrEF with recovered LVEF 60 to 65%, PAF on Eliquis, CAD s/p CABG and stenting, DM, CKD stage IIIb, HIV on HAART, and cocaine abuse who presented in the ED with complaints of left-sided chest pain for one day. Patient had just recently been hospitalized from 11/30-12/1/23 with complains of chest pain. Patient was recommended to continue aspirin, Plavix, Imdur and restarted on atorvastatin.  He reports that he had not been taking his medications as prescribed since being discharged although he has them. He is admitted for AKI with Cr 6.3.  Patient has active suicidal ideations and psychiatry was consulted.   Assessment & Plan:   Principal Problem:   ARF (acute renal failure) ([name]) Active Problems:   Suicidal ideations   3-vessel CAD   Anemia   Essential hypertension   Paroxysmal A-fib ([name])   (HFpEF) heart failure with preserved ejection fraction ([name])   Elevated troponin   HIV (human immunodeficiency virus infection) ([place])   Cocaine use disorder ([name])   Cocaine abuse, continuous ([place])   History of pulmonary embolism   Uncontrolled type 2 diabetes mellitus with hyperglycemia ([place])   AKI (acute kidney injury) ([place])  AKI on CKD stage IIIb: Baseline serum creatinine around 2.5. Creatinine on admission 6.3. UA: Negative.  Urine drug screen cocaine +. Renal ultrasound unremarkable. Continue IV fluids. Renal functions improving, Creatinine 6.3 > 4.63> 4.42> 3.92  Suicidal ideations in the setting of depression: Patient has thoughts about hurting himself without any obvious plan. Psychiatry was consulted. Initiated on 1:1 sitter  , discontinued today. Continue Prozac 20 mg for depression. His depression appears to be stable.  Patient endorses chronic SI but is currently denying SI and contracting for  safety.  He is interested in inpatient rehab for substance use.  CAD s/p CABG: Chronic atypical chest pain: Patient has been noncompliant with his medications. He has been evaluated by cardiology. He was advised to continue aspirin, Plavix and Imdur.   Essential hypertension: Continue Cardizem CD120 mg daily Continue hydralazine 10 mg IV q6hr as needed.   Heart failure with preserved EF: Recent echo shows LVEF 55%. No RWMA Appears euvolemic at this time.  HIV+ Infectious disease consulted for resumption of HAART. Patient is non compliant due to mental health and substance abuse issues. There is no indication to start antiretroviral therapy at this time as he will not continue and ultimately leads to resistance.   Paroxysmal atrial fibrillation: Heart rate well controlled and remains in sinus rhythm. Continue diltiazem CD 120 mg daily. Not on A/C due to non compliance.   Anemia of chronic disease: Hemoglobin 9.6 which seems his baseline. Hb trending down 9.6 > 7.9 > 7.6 Transfuse if Hb drops below 7.0   Diabetes mellitus type 2 with hyperglycemia: Hemoglobin A1c 7.7. Continue regular insulin sliding scale.   History of PE: During recent hospitalization he was advised to discontinue Eliquis. Continue aspirin and Plavix.  Cocaine abuse: Patient admits of using cocaine 1 day prior to admission. Counseled to quit using it. Social worker consult  DVT prophylaxis: Heparin sq Code Status: Full code Family Communication: No family at bed side. Disposition Plan:   Status is: Inpatient Remains inpatient appropriate because:  AKI on CKD stage IIIb, suicidal ideation.  Psychiatry is consulted.  Patient is not medically clear for discharge.  Consultants:  Psychiatry  Procedures: None Antimicrobials: None  Subjective: Patient was seen and examined at bedside. Overnight events noted. He remains comfortable,  states he has been noncompliant with his outpatient  medications. He also reported using cocaine, still has suicidal thoughts but no plan.  Objective: Vitals:   09/12/22 0520 09/12/22 0758 09/12/22 0800 09/12/22 0833  BP: (!) 163/90  (!) 186/90   Pulse: 79  77   Resp: 18  18   Temp: 99 F (37.2 C)  98.4 F (36.9 C)   TempSrc: Oral  Oral   SpO2: 100%  100% 99%  Weight:  69 kg    Height:        Intake/Output Summary (Last 24 hours) at 09/12/2022 1135 Last data filed at 09/12/2022 0800 Gross per 24 hour  Intake 3513.57 ml  Output 3675 ml  Net -161.43 ml   Filed Weights   09/12/22 0758  Weight: 69 kg    Examination:  General exam: Appears comfortable, not in any acute distress.  Deconditioned Respiratory system: CTA bilaterally, respiratory effort normal, RR 15. Cardiovascular system: S1 & S2 heard, regular rate and rhythm, no murmur. Gastrointestinal system: Abdomen is soft, non tender, non distended, BS+ Central nervous system: Alert and oriented x 3.  No focal neurological deficits. Extremities: No edema, no cyanosis, no clubbing Skin: No rashes, lesions or ulcers Psychiatry: Judgement and insight appear normal. Mood & affect appropriate.     Data Reviewed: I have personally reviewed following labs and imaging studies  CBC: Recent Labs  Lab 09/05/22 2036 09/06/22 0040 09/09/22 0843 09/10/22 0913 09/11/22 0434  WBC  --  4.9 7.8 5.3 4.3  HGB 9.4* 8.8* 9.6* 7.9* 7.6*  HCT 29.5* 26.5* 31.3* 24.9* 22.5*  MCV  --  83.3 89.4 87.1 82.7  PLT  --  148* 155 124* 767*   Basic Metabolic Panel: Recent Labs  Lab 09/06/22 0040 09/09/22 0843 09/10/22 0913 09/10/22 1426 09/11/22 0434  NA 141 135 136 136 138  K 4.5 5.1 4.4 4.7 4.6  CL 111 107 111 112* 112*  CO2 22 15* 19* 17* 19*  GLUCOSE 93 123* 172* 187* 262*  BUN 38* 81* 68* 64* 57*  CREATININE 2.51* 6.32* 4.63* 4.42* 3.92*  CALCIUM 8.6* 9.1 7.8* 7.9* 7.5*  MG  --   --   --   --  1.4*  PHOS 3.7  --   --   --   --    GFR: Estimated Creatinine Clearance: 16.5  mL/min (A) (by C-G formula based on SCr of 3.92 mg/dL (H)). Liver Function Tests: Recent Labs  Lab 09/09/22 0843  AST 24  ALT 27  ALKPHOS 93  BILITOT 0.5  PROT 7.8  ALBUMIN 4.0   No results for input(s): "LIPASE", "AMYLASE" in the last 168 hours. No results for input(s): "AMMONIA" in the last 168 hours. Coagulation Profile: No results for input(s): "INR", "PROTIME" in the last 168 hours. Cardiac Enzymes: Recent Labs  Lab 09/09/22 1000  CKTOTAL 215   BNP (last 3 results) No results for input(s): "PROBNP" in the last 8760 hours. HbA1C: No results for input(s): "HGBA1C" in the last 72 hours. CBG: Recent Labs  Lab 09/11/22 1141 09/11/22 1654 09/11/22 2052 09/12/22 0826 09/12/22 1122  GLUCAP 105* 121* 150* 157* 131*   Lipid Profile: No results for input(s): "CHOL", "HDL", "LDLCALC", "TRIG", "CHOLHDL", "LDLDIRECT" in the last 72 hours. Thyroid Function Tests: No results for input(s): "TSH", "T4TOTAL", "FREET4", "T3FREE", "THYROIDAB" in the last 72 hours.  Anemia Panel: No results for input(s): "VITAMINB12", "FOLATE", "FERRITIN", "TIBC", "IRON", "RETICCTPCT" in the last 72 hours. Sepsis Labs: No results for input(s): "PROCALCITON", "LATICACIDVEN" in the last 168 hours.  No results found for this or any previous visit (from the past 240 hour(s)).   Radiology Studies: No results found.  Scheduled Meds:  aspirin  81 mg Oral Daily   atorvastatin  40 mg Oral Daily   clopidogrel  75 mg Oral Daily   diltiazem  120 mg Oral Daily   feeding supplement  237 mL Oral BID BM   FLUoxetine  20 mg Oral Daily   gabapentin  300 mg Oral QHS   heparin  5,000 Units Subcutaneous Q8H   hydrALAZINE  25 mg Oral Q8H   insulin aspart  0-5 Units Subcutaneous QHS   insulin aspart  0-9 Units Subcutaneous TID WC   isosorbide mononitrate  15 mg Oral Daily   mometasone-formoterol  2 puff Inhalation BID   multivitamin  1 tablet Oral QHS   sevelamer carbonate  800 mg Oral TID with meals    sodium chloride flush  3 mL Intravenous Q12H   umeclidinium bromide  1 puff Inhalation Daily   Continuous Infusions:  sodium chloride 100 mL/hr at 09/12/22 1104     LOS: 2 days    Time spent: 35 mins    Emoni Whitworth, MD [facility]   If 7PM-7AM, please contact night-coverage

## 2022-09-12 NOTE — Telephone Encounter (Signed)
Referral placed for [facility] for case management services.   Eugenia Mcalpine, LPN

## 2022-09-12 NOTE — Consult Note (Addendum)
[facility] Followup Face-to-Face Psychiatric Evaluation  Service Date: September 12, 2022 LOS:  LOS: 2 days   Assessment  Michael [name] is a 71 y.o. male admitted medically for 09/09/2022  2:38 AM for AKI on CKD stage IIIb. He carries the psychiatric diagnoses of major depressive disorder, cocaine use disorder and has a past medical history of CAD s/p CABG and stenting, HTN, HFrEF with recovered LVEF 60-65%, HIV, T2DM, gout, HLD, PAF on eliquis. Psychiatry was consulted for suicidal ideation by Dr. Reesa Chew.   His current presentation of suicidal ideation with no plan, inconsistency with suicidal ideation and in the setting of recent cocaine use is most consistent with substance-induced mood disorder. He meets criteria for substance-induced mood disorder based on recent worsening mood in the setting of recent cocaine use a week ago. Current outpatient psychotropic medications include prozac 20 and historically he has been non-compliant with medications prior to admission which makes it difficult to assess his response. This is evidenced by his inconsistent fill history. On initial examination, patient does not fully engage with interviewer, reports that he is tired and closes his eyes multiple times. He does have prior diagnosis of major depressive disorder and in the setting of his acute on chronic kidney injury, we could continue his prozac or start zoloft or lexapro. No dosing adjustment for prozac is required in setting of renal insufficiency. Given his current reported SI with no plan, will keep 1:1 sitter for now and re-evaluate need for this as well as further discuss medication regimen with patient tomorrow. Please see plan below for detailed recommendations.   12/6 Patient continues to endorse chronic SI (and noted in chart review) but is currently denying SI and contracting for safety. We recommend discontinuing 1:1 given his chronic SI in the setting of unstable housing,  chronic medical conditions. His depression appears to be stable at this time and would continue his prozac at current dose. He is interested in inpatient rehab for his substance use and we would prefer for him to have a door-to-door to inpatient substance use rehab from this hospitalization.    12/7 Patient with no acute SI, continues to have chronic SI. Patient with brighter affect today, less somnolent compared to prior. Will continue with prozac '20mg'$ . Patient interested in inpatient substance use rehab. Discussed with social work, continuing to work on placement.   Diagnoses:  Active Hospital problems: Principal Problem:   ARF (acute renal failure) ([name]) Active Problems:   HIV (human immunodeficiency virus infection) ([place])   Cocaine use disorder ([name])   Cocaine abuse, continuous ([place])   3-vessel CAD   Suicidal ideations   Anemia   AKI (acute kidney injury) ([place])   Essential hypertension   History of pulmonary embolism   Paroxysmal A-fib ([place])   Uncontrolled type 2 diabetes mellitus with hyperglycemia ([name])   (HFpEF) heart failure with preserved ejection fraction ([name])   Elevated troponin    Plan  ## Safety and Observation Level:  - Based on my clinical evaluation, I estimate the patient to be at low acute risk of self harm in the current setting, high chronic risk of self-harm - At this time, we recommend a routine level of observation. This decision is based on my review of the chart including patient's history and current presentation, interview of the patient, mental status examination, and consideration of suicide risk including evaluating suicidal ideation, plan, intent, suicidal or self-harm behaviors, risk factors, and protective factors. This judgment is based on our  ability to directly address suicide risk, implement suicide prevention strategies and develop a safety plan while the patient is in the clinical setting. Please contact our team if there is a concern that risk  level has changed.  ## Medications:  -- CONTINUE prozac '20mg'$  for depression   ## Medical Decision Making Capacity:  -- Not formally assessed   ## Further Work-up:  -- Per primary   -- most recent EKG on 12/4 had QtC of 411 -- Pertinent labwork reviewed earlier this admission includes: Cr 6.32, Hgb 9.6, UDS+cocaine, [id number]<72, salicylate<7. Renal U/S normal   ## Disposition:  -- Will continue to follow, prefer patient have door-to-door for inpatient rehab  ## Behavioral / Environmental:  --routine   ##Legal Status -Voluntary  Thank you for this consult request. Recommendations have been communicated to the primary team.  We will follow at this time.   Rolanda Lundborg, MD, PGY-1   Follow-up history  Relevant Aspects of Hospital Course:  Admitted on 09/09/2022 for AKI on CKD stage IIIb.  Patient was recently admitted to [facility] and discharged (12/1) for chest pain rule out. Seen by cardiology who recommended medical management and outpatient follow-up. Noted to have difficulty with adherence and follow-up.    Patient Report:  Patient reports that he started feeling weak on his left side a week before and that he came to the hospital because he was tired and having pain in his kidneys. He reports feeling tired and is closing his eyes and not fully answering questions throughout the interview. He reports that he is sad because of his "lifestyle - being sick all the time." He reports that he is homeless and lives by himself, sleeping at [employer]. He reports prior diagnoses of depression, anxiety. Reports he has been only sleeping 2 hours for the past week. Reports he does go see his outpatient doctors. Reports he has never seen a psychiatrist but has seen a therapist before. He reports he has not been taking his medications. He reports a suicide attempt a year ago when he cut his wrist. When asked about SI, he initially denies current SI and states that his last thought was 2 months  ago. When asked about his SI on admission, he then states that he has had SI every day for the past 2 months. He denies any plan.   He reports he last smoked crack cocaine a week ago. He denies other substance use including tobacco, alcohol, marijuana. He reports he previously went to rehab but is unable to state when.   He reports seeing visual hallucinations "little people" and auditory hallucinations "little people talking." He denies delusions, thought insertion, withdrawal, or broadcasting.   12/6: Patient continues to report that he is tired, did not sleep well last night. Reports decreased appetite. His chest pain comes and goes. Reports his mood is a little better. He also reports that his depression comes and goes. He initially reports that he has SI all through the night and early this morning. On later questioning, reports that it is the same SI that he has had because he feels depressed about his life. He contracts for safety while in the hospital. He denies any plans. He reports last visual hallucinations were 2 nights ago. He denies auditory hallucinations. He reports interest in inpatient rehab. Reports he last went to inpatient rehab at [facility] 3 years ago and completed a program there.   12/7 Patient reports he slept well last night, no changes with appetite. Pain on  the L side of his body comes and goes. Reports his mood is fair - a little better. Denies current auditory or visual hallucinations. Reports last occurrence was last week. Reports he sees a man dressed in all black with a big top hat when he is waking up from sleep. When he does hear voices, he reports they state that he is no good. He denies current SI and contracts for safety. Discussed importance of medication compliance with prozac and he agrees. He is interested in abstinence from cocaine and in rehab. He endorses chronic SI. He reports that he likes fishing.   ROS:  As above   Collateral information:  Patient  declines for anyone to be contacted.   Psychiatric History:  Information collected from chart review  Patient was at inpatient psychiatric hospital at [facility] from 3/10-3/14/22. At the time, was diagnosed with MDD and discharged with lexapro 10. He was admitted for risk of self-injury and medication non-compliance. Their note states "Patient has had SI and been admitted 4x within the past 6 months. He will ask for help for his SI and demand to be discharged."   Most recently he was discharged from [facility] on prozac 20. Looks like he was prescribed this from [facility] in 7/25. He did not refill until 9/21. Next fill was on 12/1 from [facility].   He has been consulted on by TTS at [facility] health, notably on 11/04/2021, 11/12/21.  Family psych history: None noted in chart   Social History:  Tobacco use: Denies Alcohol use: Denies Drug use: Reports cocaine use. Last use a week ago. Has been on and off for the past 30 years.   Completed up to 12th grade.  He is widowed.  Has 2 children (per chart review) Reports he is on disability, lives by himself and is homeless but sleeps at hotels.  Grew up in [place], [place] then went to [place], then got a bus back to [place].   Family History:  The patient's family history includes Cancer in his father; Diabetes in his brother, father, mother, and sister; Heart disease in his brother; Hyperlipidemia in his mother; Hypertension in his father and mother.  Medical History: Past Medical History:  Diagnosis Date   A-fib ([place])    Anemia    Asthma    No PFTs, history of childhood asthma   CAD (coronary artery disease)    Cellulitis 04/2014   left facial   Cellulitis and abscess of toe of right foot 12/08/2019   Chondromalacia of medial femoral condyle    Left knee MRI 04/28/12: Chondromalacia of the medial femoral condyle with slight peripheral degeneration of the meniscocapsular junction of the medial meniscus; followed by sports medicine   Chronic  kidney failure, stage 4 (severe) ([place])    Collagen vascular disease ([place])    Crack cocaine use    for 20+ years, has been enrolled in detox programs in the past   Depression    with history of hospitalization for suicidal ideation   Diabetes mellitus 2002   Diagnosed in 2002, started insulin in 2012   Gout    Headache(784.0)    CT head 08/2011: Periventricular and subcortical white matter hypodensities are most in keeping with chronic microangiopathic change   HIV infection ([place]) 08/2011   Followed by [name]   Hyperlipidemia    Hypertension    Pulmonary embolism [facility])     Surgical History: Past Surgical History:  Procedure Laterality Date   AMPUTATION Right  07/21/2019   Procedure: RIGHT SECOND TOE AMPUTATION;  Surgeon: [name], MD;  Location: [facility];  Service: Orthopedics;  Laterality: Right;   BACK SURGERY     1988   BOWEL RESECTION     CARDIAC SURGERY     CERVICAL SPINE SURGERY     " rods in my neck "   CORONARY ARTERY BYPASS GRAFT     CORONARY STENT PLACEMENT     NM MYOCAR PERF WALL MOTION  12/27/2011   normal   SPINE SURGERY      Medications:   Current Facility-Administered Medications:    0.9 %  sodium chloride infusion, , Intravenous, Continuous, Smith, Rondell A, MD, Last Rate: 100 mL/hr at 09/12/22 1104, New Bag at 09/12/22 1104   acetaminophen (TYLENOL) tablet 650 mg, 650 mg, Oral, Q6H PRN **OR** acetaminophen (TYLENOL) suppository 650 mg, 650 mg, Rectal, Q6H PRN, Tamala Julian, Rondell A, MD   aspirin chewable tablet 81 mg, 81 mg, Oral, Daily, Tamala Julian, Rondell A, MD, 81 mg at 09/12/22 0851   atorvastatin (LIPITOR) tablet 40 mg, 40 mg, Oral, Daily, Tamala Julian, Rondell A, MD, 40 mg at 09/12/22 0851   clopidogrel (PLAVIX) tablet 75 mg, 75 mg, Oral, Daily, Tamala Julian, Rondell A, MD, 75 mg at 09/12/22 0848   diltiazem (CARDIZEM CD) 24 hr capsule 120 mg, 120 mg, Oral, Daily, Tamala Julian, Rondell A, MD, 120 mg at 09/12/22 0850   feeding supplement (ENSURE ENLIVE / ENSURE PLUS) liquid  237 mL, 237 mL, Oral, BID BM, Shawna Clamp, MD, 237 mL at 09/11/22 1706   FLUoxetine (PROZAC) capsule 20 mg, 20 mg, Oral, Daily, Tamala Julian, Rondell A, MD, 20 mg at 09/12/22 0851   gabapentin (NEURONTIN) capsule 300 mg, 300 mg, Oral, QHS, Smith, Rondell A, MD, 300 mg at 09/11/22 2143   guaiFENesin (ROBITUSSIN) 100 MG/5ML liquid 5 mL, 5 mL, Oral, Q4H PRN, Amin, Ankit Chirag, MD   heparin injection 5,000 Units, 5,000 Units, Subcutaneous, Q8H, Smith, Rondell A, MD, 5,000 Units at 09/12/22 6659   hydrALAZINE (APRESOLINE) injection 10 mg, 10 mg, Intravenous, Q4H PRN, Amin, Ankit Chirag, MD   hydrALAZINE (APRESOLINE) tablet 25 mg, 25 mg, Oral, Q8H, Shawna Clamp, MD, 25 mg at 09/12/22 0850   insulin aspart (novoLOG) injection 0-5 Units, 0-5 Units, Subcutaneous, QHS, Smith, Rondell A, MD   insulin aspart (novoLOG) injection 0-9 Units, 0-9 Units, Subcutaneous, TID WC, Smith, Rondell A, MD, 1 Units at 09/12/22 1206   ipratropium-albuterol (DUONEB) 0.5-2.5 (3) MG/3ML nebulizer solution 3 mL, 3 mL, Nebulization, Q4H PRN, Amin, Ankit Chirag, MD   isosorbide mononitrate (IMDUR) 24 hr tablet 15 mg, 15 mg, Oral, Daily, Smith, Rondell A, MD, 15 mg at 09/12/22 0850   metoprolol tartrate (LOPRESSOR) injection 5 mg, 5 mg, Intravenous, Q4H PRN, Amin, Ankit Chirag, MD   mometasone-formoterol (DULERA) 200-5 MCG/ACT inhaler 2 puff, 2 puff, Inhalation, BID, Fuller Plan A, MD, 2 puff at 09/12/22 0856   multivitamin (RENA-VIT) tablet 1 tablet, 1 tablet, Oral, QHS, Shawna Clamp, MD, 1 tablet at 09/11/22 2143   ondansetron (ZOFRAN) tablet 4 mg, 4 mg, Oral, Q6H PRN **OR** ondansetron (ZOFRAN) injection 4 mg, 4 mg, Intravenous, Q6H PRN, Smith, Rondell A, MD   senna-docusate (Senokot-S) tablet 1 tablet, 1 tablet, Oral, QHS PRN, Amin, Ankit Chirag, MD   sevelamer carbonate (RENVELA) tablet 800 mg, 800 mg, Oral, TID with meals, Smith, Rondell A, MD, 800 mg at 09/12/22 1206   sodium chloride flush (NS) 0.9 % injection 3 mL, 3 mL,  Intravenous, Q12H, Tamala Julian,  Rondell A, MD, 3 mL at 09/12/22 0853   traZODone (DESYREL) tablet 50 mg, 50 mg, Oral, QHS PRN, Amin, Ankit Chirag, MD, 50 mg at 09/11/22 2143   umeclidinium bromide (INCRUSE ELLIPTA) 62.5 MCG/ACT 1 puff, 1 puff, Inhalation, Daily, Tamala Julian, Rondell A, MD, 1 puff at 09/12/22 0856  Allergies: No Known Allergies   Objective  Vital signs:  Temp:  [97.7 F (36.5 C)-99 F (37.2 C)] 98.4 F (36.9 C) (12/07 0800) Pulse Rate:  [65-79] 77 (12/07 0800) Resp:  [16-18] 18 (12/07 0800) BP: (159-186)/(75-90) 186/90 (12/07 0800) SpO2:  [92 %-100 %] 99 % (12/07 0833) Weight:  [69 kg] 69 kg (12/07 0758)  Mental Status Exam   Appearance and Grooming: Patient is laying on his side in hospital bed. Alert during most of the interview. The patient has no noticeable scent or odor. Motor activity: The patient's movement speed and his gait was not observed during encounter. There was no notable abnormal facial movements and no notable abnormal extremity movements. Behavior: The patient appears in no acute distress, and during the interview, was calm; he was able to follow commands and compliant to requests and made good eye contact. The patient did not appear internally or externally preoccupied. Attitude: Patient's attitude towards the interviewer was cooperative. Speech: The patient's speech was fluent, with good articulation, and with appropriately placed inflections. The volume of his speech was normal and normal in quantity. The rate was normal with a normal rhythm. Responses were normal in latency. There were no abnormal patterns in speech. Mood: "Fair - a little better" Affect: Patient's affect is euthymic with broad range and even fluctuations; his affect is congruent with his stated mood. ------------------------------------------------------------------------------------------------------------------------- Thought Content The patient experiences no hallucinations. The  patient describes no delusional thoughts; he denies thought insertion, denies thought withdrawal, denies thought interruption, and denies thought broadcasting. Patient at the time of interview endorses passive suicidal ideation, stating "it comes and goes" and denies active suicidal intent; he denies homicidal intent. Thought Process The patient's thought process is linear and is goal-directed. Insight The patient at the time of interview demonstrates fair insight  Judgement The patient over the past 24 hours demonstrates fair judgement  Memory: limited   Executive Functions  Concentration: Intact  Attention Span: Fair, can spell WORLD backwards.  Recall: Poor Fund of Knowledge: Not formally assessed  Alertness/Orientation: oriented to person, place, month   Assets  Assets: Armed forces logistics/support/administrative officer; Desire for Improvement; Financial Resources/Insurance  Sleep  Sleep:No data recorded  Physical Exam Constitutional:      Appearance: the patient is laying on his side in the hospital bed, alert  Pulmonary:     Effort: Pulmonary effort is normal.  Neurological:     General: No focal deficit present.     Mental Status: the patient is oriented to person, place, month, year  Review of Systems  Respiratory:  Negative for shortness of breath.   Cardiovascular:  Negative for chest pain.  Gastrointestinal:  Negative for abdominal pain, constipation, diarrhea, nausea and vomiting.  Neurological:  Negative for headaches.  MSK: Positive for L sided pain that comes and goes   Blood pressure (!) 186/90, pulse 77, temperature 98.4 F (36.9 C), temperature source Oral, resp. rate 18, height '5\' 8"'$  (1.727 m), weight 69 kg, SpO2 99 %. Body mass index is 23.13 kg/m.

## 2022-09-13 DIAGNOSIS — F1910 Other psychoactive substance abuse, uncomplicated: Secondary | ICD-10-CM | POA: Diagnosis not present

## 2022-09-13 DIAGNOSIS — F1994 Other psychoactive substance use, unspecified with psychoactive substance-induced mood disorder: Secondary | ICD-10-CM | POA: Diagnosis not present

## 2022-09-13 DIAGNOSIS — F331 Major depressive disorder, recurrent, moderate: Secondary | ICD-10-CM | POA: Diagnosis not present

## 2022-09-13 DIAGNOSIS — N179 Acute kidney failure, unspecified: Secondary | ICD-10-CM | POA: Diagnosis not present

## 2022-09-13 DIAGNOSIS — R45851 Suicidal ideations: Secondary | ICD-10-CM | POA: Diagnosis not present

## 2022-09-13 LAB — BASIC METABOLIC PANEL
Anion gap: 5 (ref 5–15)
BUN: 36 mg/dL — ABNORMAL HIGH (ref 8–23)
CO2: 18 mmol/L — ABNORMAL LOW (ref 22–32)
Calcium: 7.9 mg/dL — ABNORMAL LOW (ref 8.9–10.3)
Chloride: 118 mmol/L — ABNORMAL HIGH (ref 98–111)
Creatinine, Ser: 2.86 mg/dL — ABNORMAL HIGH (ref 0.61–1.24)
GFR, Estimated: 23 mL/min — ABNORMAL LOW (ref >60)
Glucose, Bld: 160 mg/dL — ABNORMAL HIGH (ref 70–99)
Potassium: 4.7 mmol/L (ref 3.5–5.1)
Sodium: 141 mmol/L (ref 135–145)

## 2022-09-13 LAB — GLUCOSE, CAPILLARY
Glucose-Capillary: 121 mg/dL — ABNORMAL HIGH (ref 70–99)
Glucose-Capillary: 111 mg/dL — ABNORMAL HIGH (ref 70–99)
Glucose-Capillary: 162 mg/dL — ABNORMAL HIGH (ref 70–99)
Glucose-Capillary: 183 mg/dL — ABNORMAL HIGH (ref 70–99)

## 2022-09-13 LAB — MAGNESIUM: Magnesium: 1.8 mg/dL (ref 1.7–2.4)

## 2022-09-13 LAB — URINE CYTOLOGY ANCILLARY ONLY
Chlamydia: NEGATIVE
Comment: NEGATIVE
Comment: NORMAL
Neisseria Gonorrhea: NEGATIVE

## 2022-09-13 LAB — PHOSPHORUS: Phosphorus: 1.9 mg/dL — ABNORMAL LOW (ref 2.5–4.6)

## 2022-09-13 LAB — RPR: RPR Ser Ql: NONREACTIVE

## 2022-09-13 LAB — T-HELPER CELLS (CD4) COUNT (NOT AT ARMC)
CD4 % Helper T Cell: 36 % (ref 33–65)
CD4 T Cell Abs: 296 /uL — ABNORMAL LOW (ref 400–1790)

## 2022-09-13 MED ORDER — SODIUM PHOSPHATES 45 MMOLE/15ML IV SOLN
30.0000 mmol | Freq: Once | INTRAVENOUS | Status: AC
  Administered 2022-09-13: 30 mmol via INTRAVENOUS
  Filled 2022-09-13: qty 10

## 2022-09-13 MED ORDER — HYDRALAZINE HCL 50 MG PO TABS
50.0000 mg | ORAL_TABLET | Freq: Three times a day (TID) | ORAL | Status: DC
  Administered 2022-09-13 – 2022-10-18 (×106): 50 mg via ORAL
  Filled 2022-09-13 (×106): qty 1

## 2022-09-13 MED ORDER — POTASSIUM PHOSPHATES 15 MMOLE/5ML IV SOLN: 30.0000 mmol | Freq: Once | INTRAVENOUS | Status: DC

## 2022-09-13 NOTE — TOC Progression Note (Signed)
Transition of Care [facility]) - Initial/Assessment Note    Patient Details  Name: Michael [name] MRN: [id number] Date of Birth: 1951-07-08  [facility] CM/SW Contact:    Milinda Antis, [place] Phone Number: 09/13/2022, 1:28 PM  Clinical Narrative:                 LCSW met with the patient at bedside to give information for the only inpatient SA resource that could take him, [facility].  LCSW called the following inpatient SA centers: [facility], [facility], [facility], [facility], [facility], and [facility].  Only [facility] could accept.    The patient reports that he is not going to [facility] as he has been before.  The patient also reports that he has been told that he is going to rehab to receive physical therapy.  LCSW completed chart review and does not see any documentation in reference to PT recommending SNF.    LCSW requested that MD add orders for both PT and OT to assist with disposition.    TOC will continue to follow.     Barriers to Discharge: Continued Medical Work up   Patient Goals and CMS Choice        Expected Discharge Plan and Services                                                Prior Living Arrangements/Services                       Activities of Daily Living Home Assistive Devices/Equipment: Cane (specify quad or straight) ADL Screening (condition at time of admission) Patient's cognitive ability adequate to safely complete daily activities?: Yes Is the patient deaf or have difficulty hearing?: No Does the patient have difficulty seeing, even when wearing glasses/contacts?: Yes Does the patient have difficulty concentrating, remembering, or making decisions?: Yes Patient able to express need for assistance with ADLs?: Yes Does the patient have difficulty dressing or bathing?: Yes Independently performs ADLs?: Yes (appropriate for developmental age) Does the patient have difficulty walking or climbing stairs?:  Yes Weakness of Legs: Both Weakness of Arms/Hands: Both  Permission Sought/Granted                  Emotional Assessment              Admission diagnosis:  ARF (acute renal failure) ([place]) [N17.9] Polysubstance abuse ([place]) [F19.10] AKI (acute kidney injury) ([place]) [N17.9] Patient Active Problem List   Diagnosis Date Noted   ARF (acute renal failure) ([place]) 09/09/2022   (HFpEF) heart failure with preserved ejection fraction ([place]) 09/09/2022   Elevated troponin 09/09/2022   Impaired functional mobility, balance, and endurance 11/29/2021   Transfusion history 11/29/2021   Unstable balance 11/29/2021   Dilated cardiomyopathy ([place]) 11/29/2021   Arthritis 11/29/2021   Diabetic foot infection ([place]) 06/20/2021   Presence of aortocoronary bypass graft 04/16/2021   Iron deficiency anemia 04/04/2021   Atypical chest pain 04/03/2021   Anemia of chronic disease 12/14/2020   Uncontrolled type 2 diabetes mellitus with hyperglycemia ([place]) 08/14/2020   Idiopathic chronic gout of multiple sites without tophus 02/17/2020   Diabetic ulcer of toe of right foot associated with type 2 diabetes mellitus ([place]) 12/17/2019   Vitamin B12 deficiency 12/17/2019   Homelessness 12/13/2019  Cellulitis 12/07/2019   Osteomyelitis ([place]) 07/17/2019   Abscess or cellulitis of toe, right    Toe pain, right-second    MDD (major depressive disorder), recurrent episode, severe ([place]) 06/15/2019   Respiratory distress 04/12/2019   Pneumonia due to severe acute respiratory syndrome coronavirus 2 (SARS-CoV-2) 04/11/2019   COVID-19 virus infection 03/19/2019   Chest pain 10/03/2018   Malnutrition of moderate degree 09/21/2018   MDD (major depressive disorder), recurrent episode, moderate ([name])    Type II diabetes mellitus with renal manifestations ([place]) 05/04/2018   GERD (gastroesophageal reflux disease) 05/04/2018   S/P CABG (coronary artery bypass graft)    Left testicular pain    Depression 02/20/2018    Epididymo-orchitis, acute 02/20/2018   Essential hypertension 02/20/2018   Precordial chest pain 02/20/2018   AKI (acute kidney injury) ([place]) 02/14/2018   HCAP (healthcare-associated pneumonia) 02/14/2018   History of pulmonary embolism 07/04/2017   Acute hyponatremia 05/11/2017   Hyperglycemia due to type 2 diabetes mellitus ([place]) 05/11/2017   CHF (congestive heart failure) ([place]) 04/01/2017   Hepatitis C 12/30/2016   Substance induced mood disorder ([place]) 11/23/2016   Chronic ischemic heart disease 11/12/2016   Paroxysmal A-fib ([place]) 11/12/2016   Back pain 04/18/2016   S/P carotid endarterectomy 11/15/2015   Chronic obstructive pulmonary disease, unspecified ([place]) 10/22/2015   Stenosis of cervical spine with myelopathy ([place]) 10/20/2015   MDD (major depressive disorder), recurrent severe, without psychosis ([place]) 09/09/2015   History of fusion of cervical spine 08/28/2015   Cocaine-induced mood disorder ([place]) 08/14/2015   Cocaine abuse with cocaine-induced mood disorder ([name]) 08/14/2015   Gout 07/10/2015   Acute renal failure superimposed on stage 3 chronic kidney disease ([place]) 03/06/2015   Anemia 03/06/2015   Chronic kidney disease, stage III (moderate) ([place]) 03/06/2015   Hypoglycemia    Encounter for general adult medical examination with abnormal findings 02/09/2015   Cocaine use disorder, moderate, dependence ([place]) 12/13/2014   Substance or medication-induced depressive disorder with onset during withdrawal ([place]) 12/13/2014   Severe recurrent major depressive disorder with psychotic features ([place]) 12/12/2014   Major depressive disorder, recurrent, severe without psychotic features ([place])    Suicidal ideations 08/15/2014   Cervicalgia 06/28/2014   Lumbar radiculopathy, chronic 06/28/2014   Asthma, chronic 02/03/2014   S/P percutaneous transluminal coronary angioplasty 10/15/2013   3-vessel CAD 06/24/2013   ED (erectile dysfunction) of organic origin 07/07/2012   Cocaine abuse,  continuous ([place]) 05/13/2012   Hypertension goal BP (blood pressure) < 140/80 04/29/2012   Chondromalacia of left knee 03/19/2012   Hyperlipidemia with target LDL less than 100 02/12/2012   Fibromyalgia 02/12/2012   Cocaine use disorder ([place]) 01/10/2012    Class: Acute   Human immunodeficiency virus (HIV) disease ([place]) 12/16/2011   HIV (human immunodeficiency virus infection) ([place]) 08/27/2011   Uncontrolled type 2 diabetes with neuropathy 10/17/2000   PCP:  [name], MD Pharmacy:   [name] [address] [place] [address] Phone: [phone] Fax: [place] [address] [address] Phone: [phone] Fax: [place], [place] - [address] [place] [address] Phone: [phone] Fax: [phone]     Social Determinants of Health ([place]) Interventions    Readmission Risk Interventions    07/05/2022   10:08 AM  Readmission Risk Prevention Plan  Transportation Screening Complete  Medication Review (RN Care Manager) Complete  PCP  or Specialist appointment within 3-5 days of discharge Complete  HRI or [place] Complete  SW Recovery Care/Counseling Consult Complete  [place] Not Applicable

## 2022-09-13 NOTE — Care Management Important Message (Signed)
Important Message  Patient Details  Name: Michael [name] MRN: [id number] Date of Birth: [birth year]   Medicare Important Message Given:  Yes     Orbie Pyo 09/13/2022, 3:30 PM

## 2022-09-13 NOTE — Progress Notes (Signed)
PROGRESS NOTE    Michael [name]  RXV:[id number] DOB: 1951/07/22 DOA: 09/09/2022  PCP: [name], MD   Brief Narrative:  This 71 yrs old Male with PMH significant of HTN, chronic HFrEF with recovered LVEF 60 to 65%, PAF on Eliquis, CAD s/p CABG and stenting, DM, CKD stage IIIb, HIV on HAART, and cocaine abuse who presented in the ED with complaints of left-sided chest pain for one day. Patient had just recently been hospitalized from 11/30-12/1/23 with complains of chest pain. Patient was recommended to continue aspirin, Plavix, Imdur and restarted on atorvastatin.  He reports that he had not been taking his medications as prescribed since being discharged although he has them. He is admitted for AKI with Cr 6.3.  Patient has active suicidal ideations and psychiatry was consulted.   Assessment & Plan:   Principal Problem:   ARF (acute renal failure) ([name]) Active Problems:   Suicidal ideations   3-vessel CAD   Anemia   Essential hypertension   Paroxysmal A-fib ([name])   (HFpEF) heart failure with preserved ejection fraction ([name])   Elevated troponin   HIV (human immunodeficiency virus infection) ([place])   Cocaine use disorder ([name])   Cocaine abuse, continuous ([place])   History of pulmonary embolism   Uncontrolled type 2 diabetes mellitus with hyperglycemia ([name])   Human immunodeficiency virus (HIV) disease ([place])   AKI (acute kidney injury) ([place])  AKI on CKD stage IIIb: Baseline serum creatinine around 2.5. Creatinine on admission 6.3. UA: Negative.  Urine drug screen cocaine +. Renal ultrasound unremarkable. Continue IV fluids. Renal functions improving, Creatinine 6.3 > 4.63 > 4.42 > 3.92 >2.86  Suicidal ideations in the setting of depression: Patient has thoughts about hurting himself without any obvious plan. Psychiatry was consulted. Initiated on 1:1 sitter  , discontinued 09/12/22. Continue Prozac 20 mg daily for depression. His depression appears to be stable.  Patient  endorses chronic SI but is currently denying SI and contracting for safety.  He is interested in inpatient rehab for substance use. TOC notified and looking for place.  CAD s/p CABG: Chronic atypical chest pain: Patient has been noncompliant with his medications. He has been evaluated by cardiology. He was advised to continue aspirin, Plavix and Imdur.   Essential hypertension: Continue Cardizem CD120 mg daily Continue hydralazine 10 mg IV q6hr as needed. Start Hydralazine 50 mg q 8hrs for better BP control   Heart failure with preserved EF: Recent echo shows LVEF 55%. No RWMA Appears euvolemic at this time.  HIV+ Infectious disease consulted for resumption of HAART. Patient is non compliant due to mental health and substance abuse issues. There is no indication to start antiretroviral therapy at this time as he will not continue and ultimately leads to resistance.   Paroxysmal atrial fibrillation: Heart rate well controlled and remains in sinus rhythm. Continue diltiazem CD 120 mg daily. Not on A/C due to non compliance.   Anemia of chronic disease: Hemoglobin 9.6 which seems his baseline. Hb trending down 9.6 > 7.9 > 7.6>7.5 Transfuse if Hb drops below 7.0   Diabetes mellitus type 2 with hyperglycemia: Hemoglobin A1c 7.7. Continue regular insulin sliding scale.   History of PE: During recent hospitalization he was advised to discontinue Eliquis. Continue aspirin and Plavix.  Cocaine abuse: Patient admits of using cocaine 1 day prior to admission. Counseled to quit using it. Social worker consult  DVT prophylaxis: Heparin sq. Code Status: Full code Family Communication: No family at bed side. Disposition Plan:  Status is: Inpatient Remains inpatient appropriate because:  AKI on CKD stage IIIb, suicidal ideation.  Psychiatry is consulted.  Patient is not medically clear for discharge. Awaiting transfer to inpatient rehab   Consultants:   Psychiatry  Procedures: None Antimicrobials: None  Subjective: Patient was seen and examined at bedside. Overnight events noted. He remains comfortable. He said he has been noncompliant with his medications. Patient reports he continues to use cocaine,  still has suicidal thoughts but no plan.   Objective: Vitals:   09/12/22 2042 09/13/22 0520 09/13/22 0820 09/13/22 0952  BP:  (!) 166/90 (!) 173/87 (!) 177/79  Pulse:  75  83  Resp:  '18 18 18  '$ Temp:  98.9 F (37.2 C)  97.6 F (36.4 C)  TempSrc:  Oral  Oral  SpO2: 100% 100% 100% 98%  Weight:      Height:        Intake/Output Summary (Last 24 hours) at 09/13/2022 1107 Last data filed at 09/13/2022 0900 Gross per 24 hour  Intake 3201.78 ml  Output 2250 ml  Net 951.78 ml   Filed Weights   09/12/22 0758  Weight: 69 kg    Examination:  General exam: Appears comfortable, not in any acute distress.  Deconditioned. Respiratory system: CTA bilaterally, respiratory effort normal, RR 13. Cardiovascular system: S1 & S2 heard, regular rate and rhythm, no murmur. Gastrointestinal system: Abdomen is soft, non tender, non distended, BS+ Central nervous system: Alert and oriented x 3.  No focal neurological deficits. Extremities: No edema, no cyanosis, no clubbing Skin: No rashes, lesions or ulcers Psychiatry: Judgement and insight appear normal. Mood & affect appropriate.     Data Reviewed: I have personally reviewed following labs and imaging studies  CBC: Recent Labs  Lab 09/09/22 0843 09/10/22 0913 09/11/22 0434 09/12/22 1521  WBC 7.8 5.3 4.3 4.6  HGB 9.6* 7.9* 7.6* 7.5*  HCT 31.3* 24.9* 22.5* 21.9*  MCV 89.4 87.1 82.7 82.6  PLT 155 124* 130* 027*   Basic Metabolic Panel: Recent Labs  Lab 09/10/22 0913 09/10/22 1426 09/11/22 0434 09/12/22 1521 09/13/22 0551  NA 136 136 138 139 141  K 4.4 4.7 4.6 4.8 4.7  CL 111 112* 112* 118* 118*  CO2 19* 17* 19* 17* 18*  GLUCOSE 172* 187* 262* 252* 160*  BUN 68* 64*  57* 39* 36*  CREATININE 4.63* 4.42* 3.92* 3.22* 2.86*  CALCIUM 7.8* 7.9* 7.5* 7.9* 7.9*  MG  --   --  1.4* 2.1 1.8  PHOS  --   --   --   --  1.9*   GFR: Estimated Creatinine Clearance: 22.6 mL/min (A) (by C-G formula based on SCr of 2.86 mg/dL (H)). Liver Function Tests: Recent Labs  Lab 09/09/22 0843  AST 24  ALT 27  ALKPHOS 93  BILITOT 0.5  PROT 7.8  ALBUMIN 4.0   No results for input(s): "LIPASE", "AMYLASE" in the last 168 hours. No results for input(s): "AMMONIA" in the last 168 hours. Coagulation Profile: No results for input(s): "INR", "PROTIME" in the last 168 hours. Cardiac Enzymes: Recent Labs  Lab 09/09/22 1000  CKTOTAL 215   BNP (last 3 results) No results for input(s): "PROBNP" in the last 8760 hours. HbA1C: No results for input(s): "HGBA1C" in the last 72 hours. CBG: Recent Labs  Lab 09/12/22 0826 09/12/22 1122 09/12/22 1655 09/12/22 2034 09/13/22 0720  GLUCAP 157* 131* 200* 139* 121*   Lipid Profile: No results for input(s): "CHOL", "HDL", "LDLCALC", "TRIG", "CHOLHDL", "LDLDIRECT" in the  last 72 hours. Thyroid Function Tests: No results for input(s): "TSH", "T4TOTAL", "FREET4", "T3FREE", "THYROIDAB" in the last 72 hours. Anemia Panel: No results for input(s): "VITAMINB12", "FOLATE", "FERRITIN", "TIBC", "IRON", "RETICCTPCT" in the last 72 hours. Sepsis Labs: No results for input(s): "PROCALCITON", "LATICACIDVEN" in the last 168 hours.  No results found for this or any previous visit (from the past 240 hour(s)).   Radiology Studies: No results found.  Scheduled Meds:  aspirin  81 mg Oral Daily   atorvastatin  40 mg Oral Daily   clopidogrel  75 mg Oral Daily   diltiazem  120 mg Oral Daily   feeding supplement  237 mL Oral BID BM   FLUoxetine  20 mg Oral Daily   gabapentin  300 mg Oral QHS   heparin  5,000 Units Subcutaneous Q8H   hydrALAZINE  50 mg Oral Q8H   insulin aspart  0-5 Units Subcutaneous QHS   insulin aspart  0-9 Units  Subcutaneous TID WC   isosorbide mononitrate  15 mg Oral Daily   mometasone-formoterol  2 puff Inhalation BID   multivitamin  1 tablet Oral QHS   sevelamer carbonate  800 mg Oral TID with meals   sodium chloride flush  3 mL Intravenous Q12H   umeclidinium bromide  1 puff Inhalation Daily   Continuous Infusions:  sodium chloride 100 mL/hr at 09/13/22 0756   sodium phosphate 30 mmol in dextrose 5 % 250 mL infusion 30 mmol (09/13/22 1041)     LOS: 3 days    Time spent: 35 mins    Rianna Lukes, MD [facility]   If 7PM-7AM, please contact night-coverage

## 2022-09-13 NOTE — Consult Note (Signed)
[facility] Followup Face-to-Face Psychiatric Evaluation  Service Date: September 13, 2022 LOS:  LOS: 3 days   Assessment  Michael [name] is a 71 y.o. male admitted medically for 09/09/2022  2:38 AM for AKI on CKD stage IIIb. He carries the psychiatric diagnoses of major depressive disorder, cocaine use disorder and has a past medical history of CAD s/p CABG and stenting, HTN, HFrEF with recovered LVEF 60-65%, HIV, T2DM, gout, HLD, PAF on eliquis. Psychiatry was consulted for suicidal ideation by Dr. Reesa Chew.   His current presentation of suicidal ideation with no plan, inconsistency with suicidal ideation and in the setting of recent cocaine use is most consistent with substance-induced mood disorder. He meets criteria for substance-induced mood disorder based on recent worsening mood in the setting of recent cocaine use a week ago. Current outpatient psychotropic medications include prozac 20 and historically he has been non-compliant with medications prior to admission which makes it difficult to assess his response. This is evidenced by his inconsistent fill history. On initial examination, patient does not fully engage with interviewer, reports that he is tired and closes his eyes multiple times. He does have prior diagnosis of major depressive disorder and in the setting of his acute on chronic kidney injury, we could continue his prozac or start zoloft or lexapro. No dosing adjustment for prozac is required in setting of renal insufficiency. Given his current reported SI with no plan, will keep 1:1 sitter for now and re-evaluate need for this as well as further discuss medication regimen with patient tomorrow. Please see plan below for detailed recommendations.   12/6 Patient continues to endorse chronic SI (and noted in chart review) but is currently denying SI and contracting for safety. We recommend discontinuing 1:1 given his chronic SI in the setting of unstable housing,  chronic medical conditions. His depression appears to be stable at this time and would continue his prozac at current dose. He is interested in inpatient rehab for his substance use and we would prefer for him to have a door-to-door to inpatient substance use rehab from this hospitalization.    12/7 Patient with no acute SI, continues to have chronic SI. Patient with brighter affect today, less somnolent compared to prior. Will continue with prozac '20mg'$ . Patient interested in inpatient substance use rehab. Discussed with social work, continuing to work on placement.   12/8  On evaluation patient denies suicidal ideations as of today.  He continues to report fleeting suicidal ideations "they come and go."  He reports feeling overall better, which is consistent with an improvement in his mood and affect.  He does brighten upon approach, engages well, and speaks of normal tone.  He continues to remain motivated to seek inpatient residential rehab.  He prefers going straight to the rehabilitation facility.  He is unclear if he will need physical therapy/rehab or if he can pursue inpatient rehab immediately after discharge.  He denies any urges, cravings, and or withdrawal symptoms related to his chronic cocaine use.  He does continue to endorse homelessness, does not like sleeping on the streets especially during the winter.  He is guarded about use of community resources.  He remains cooperative with staff, has not required any as needed medications for behavioral concerns.  He was also restarted on home medications, denies any side effects and or acute adverse reactions.  He reports his sleep is okay and his appetite is up and down due to concerns about swallowing/trachea.   Diagnoses:  Active Hospital problems: Principal Problem:   ARF (acute renal failure) ([name]) Active Problems:   HIV (human immunodeficiency virus infection) ([place])   Human immunodeficiency virus (HIV) disease ([place])   Cocaine use  disorder ([place])   Cocaine abuse, continuous ([place])   3-vessel CAD   Suicidal ideations   Anemia   AKI (acute kidney injury) ([place])   Essential hypertension   History of pulmonary embolism   Paroxysmal A-fib ([place])   Uncontrolled type 2 diabetes mellitus with hyperglycemia ([name])   (HFpEF) heart failure with preserved ejection fraction ([name])   Elevated troponin    Plan  ## Safety and Observation Level:  - Based on my clinical evaluation, I estimate the patient to be at low acute risk of self harm in the current setting, high chronic risk of self-harm - At this time, we recommend a routine level of observation. This decision is based on my review of the chart including patient's history and current presentation, interview of the patient, mental status examination, and consideration of suicide risk including evaluating suicidal ideation, plan, intent, suicidal or self-harm behaviors, risk factors, and protective factors. This judgment is based on our ability to directly address suicide risk, implement suicide prevention strategies and develop a safety plan while the patient is in the clinical setting. Please contact our team if there is a concern that risk level has changed.  ## Medications:  -- CONTINUE prozac '20mg'$  for depression   ## Medical Decision Making Capacity:  -- Not formally assessed   ## Further Work-up:  -- Per primary   -- most recent EKG on 12/4 had QtC of 411 -- Pertinent labwork reviewed earlier this admission includes: Cr 6.32, Hgb 9.6, UDS+cocaine, MAUQJFH<54, salicylate<7. Renal U/S normal   ## Disposition:  -- Prefer patient have door-to-door for inpatient rehab  ## Behavioral / Environmental:  --routine   ##Legal Status -Voluntary  Thank you for this consult request. Recommendations have been communicated to the primary team.  We will sign off at this time.   Suella Broad, FNP,    Follow-up history  Relevant Aspects of Hospital Course:  Admitted  on 09/09/2022 for AKI on CKD stage IIIb.  Patient was recently admitted to [facility] and discharged (12/1) for chest pain rule out. Seen by cardiology who recommended medical management and outpatient follow-up. Noted to have difficulty with adherence and follow-up.    Patient Report:  Patient does not currently present with active symptoms of an acute manic or psychotic episode and he is denying SI/HI this morning. Patient does not meet involuntary commitment criteria at this time. Combination of personality structure and chronic substance abuse leading to impulsivity and mood dysregulation increase overall risk of self-harm. However, at this time, his mood is stable, the patient is future oriented. There is no indication of dangerousness to self/others. Will recommend psychiatrically clear and patient and assisting with door to door inpatient substance abuse rehabilitation facility when medically stable.   He is alert and oriented x 4.  His physical appearance is casual and appropriate for hospital environment.  He is calm, cooperative, communicated well with normal rate and normal volume.  His mood he describes as "okay".  Chronic suicide risk factors prior attempts, traumatic past, stressful life events, mental illness, chronic medical conditions and unemployment Acute suicide risk factors aggression , non-adherence to care , impulsivity, mood and personality changes , substance use and hopeless Suicide protective factors Male, minor child, willingness to seek help, religion/spiritual.  Given above risks versus  protective factors patients current acute risk for harm to self at this time is mitigated; but elevated in term of substance abuse. Given above risks versus protective factors patients current chronic risk for harm to self at this time is moderate to severe in context of ongoing substance use, but mitigated in terms of intentional imminent self-harm/suicide  Violence/assault/Homicide  Risk Assessment: No risk factors. Protective factors include no identified conflict with others. In addition patient makes no homicidal statements. No history of assault. Patient is low risk of imminent harm to others/homicide.  Suicide Risk Assessment:  Resources: Patient instructed to call 911/crisis if medical/psychiatric emergency of psychosis/SI/HI.    ROS:  As above   Collateral information:  Patient declines for anyone to be contacted.   Psychiatric History:  Information collected from chart review  Patient was at inpatient psychiatric hospital at [facility] from 3/10-3/14/22. At the time, was diagnosed with MDD and discharged with lexapro 10. He was admitted for risk of self-injury and medication non-compliance. Their note states "Patient has had SI and been admitted 4x within the past 6 months. He will ask for help for his SI and demand to be discharged."   Most recently he was discharged from [facility] on prozac 20. Looks like he was prescribed this from [facility] in 7/25. He did not refill until 9/21. Next fill was on 12/1 from [facility].   He has been consulted on by TTS at [facility], notably on 11/04/2021, 11/12/21.  Family psych history: None noted in chart   Social History:  Tobacco use: Denies Alcohol use: Denies Drug use: Reports cocaine use. Last use a week ago. Has been on and off for the past 30 years.   Completed up to 12th grade.  He is widowed.  Has 2 children (per chart review) Reports he is on disability, lives by himself and is homeless but sleeps at hotels.  Grew up in [place], [place] then went to [place], then got a bus back to [place].   Family History:  The patient's family history includes Cancer in his father; Diabetes in his brother, father, mother, and sister; Heart disease in his brother; Hyperlipidemia in his mother; Hypertension in his father and mother.  Medical History: Past Medical History:  Diagnosis Date   A-fib ([place])    Anemia    Asthma     No PFTs, history of childhood asthma   CAD (coronary artery disease)    Cellulitis 04/2014   left facial   Cellulitis and abscess of toe of right foot 12/08/2019   Chondromalacia of medial femoral condyle    Left knee MRI 04/28/12: Chondromalacia of the medial femoral condyle with slight peripheral degeneration of the meniscocapsular junction of the medial meniscus; followed by sports medicine   Chronic kidney failure, stage 4 (severe) ([place])    Collagen vascular disease ([place])    Crack cocaine use    for 20+ years, has been enrolled in detox programs in the past   Depression    with history of hospitalization for suicidal ideation   Diabetes mellitus 2002   Diagnosed in 2002, started insulin in 2012   Gout    Headache(784.0)    CT head 08/2011: Periventricular and subcortical white matter hypodensities are most in keeping with chronic microangiopathic change   HIV infection ([place]) 08/2011   Followed by [name]   Hyperlipidemia    Hypertension    Pulmonary embolism [facility])     Surgical History: Past Surgical History:  Procedure  Laterality Date   AMPUTATION Right 07/21/2019   Procedure: RIGHT SECOND TOE AMPUTATION;  Surgeon: [name], MD;  Location: [facility];  Service: Orthopedics;  Laterality: Right;   BACK SURGERY     1988   BOWEL RESECTION     CARDIAC SURGERY     CERVICAL SPINE SURGERY     " rods in my neck "   CORONARY ARTERY BYPASS GRAFT     CORONARY STENT PLACEMENT     NM MYOCAR PERF WALL MOTION  12/27/2011   normal   SPINE SURGERY      Medications:   Current Facility-Administered Medications:    0.9 %  sodium chloride infusion, , Intravenous, Continuous, Tamala Julian, Rondell A, MD, Last Rate: 100 mL/hr at 09/13/22 0756, New Bag at 09/13/22 0756   acetaminophen (TYLENOL) tablet 650 mg, 650 mg, Oral, Q6H PRN, 650 mg at 09/13/22 1041 **OR** acetaminophen (TYLENOL) suppository 650 mg, 650 mg, Rectal, Q6H PRN, Tamala Julian, Rondell A, MD   aspirin chewable tablet 81 mg, 81 mg,  Oral, Daily, Tamala Julian, Rondell A, MD, 81 mg at 09/13/22 0815   atorvastatin (LIPITOR) tablet 40 mg, 40 mg, Oral, Daily, Tamala Julian, Rondell A, MD, 40 mg at 09/13/22 0815   clopidogrel (PLAVIX) tablet 75 mg, 75 mg, Oral, Daily, Tamala Julian, Rondell A, MD, 75 mg at 09/13/22 0815   diltiazem (CARDIZEM CD) 24 hr capsule 120 mg, 120 mg, Oral, Daily, Tamala Julian, Rondell A, MD, 120 mg at 09/13/22 0815   feeding supplement (ENSURE ENLIVE / ENSURE PLUS) liquid 237 mL, 237 mL, Oral, BID BM, Shawna Clamp, MD, 237 mL at 09/13/22 0815   FLUoxetine (PROZAC) capsule 20 mg, 20 mg, Oral, Daily, Tamala Julian, Rondell A, MD, 20 mg at 09/13/22 0815   gabapentin (NEURONTIN) capsule 300 mg, 300 mg, Oral, QHS, Smith, Rondell A, MD, 300 mg at 09/12/22 2119   guaiFENesin (ROBITUSSIN) 100 MG/5ML liquid 5 mL, 5 mL, Oral, Q4H PRN, Amin, Ankit Chirag, MD   heparin injection 5,000 Units, 5,000 Units, Subcutaneous, Q8H, Smith, Rondell A, MD, 5,000 Units at 09/13/22 0525   hydrALAZINE (APRESOLINE) injection 10 mg, 10 mg, Intravenous, Q4H PRN, Amin, Ankit Chirag, MD   hydrALAZINE (APRESOLINE) tablet 50 mg, 50 mg, Oral, Q8H, Kumar, Pardeep, MD   insulin aspart (novoLOG) injection 0-5 Units, 0-5 Units, Subcutaneous, QHS, Smith, Rondell A, MD   insulin aspart (novoLOG) injection 0-9 Units, 0-9 Units, Subcutaneous, TID WC, Smith, Rondell A, MD, 2 Units at 09/12/22 1743   ipratropium-albuterol (DUONEB) 0.5-2.5 (3) MG/3ML nebulizer solution 3 mL, 3 mL, Nebulization, Q4H PRN, Amin, Ankit Chirag, MD   isosorbide mononitrate (IMDUR) 24 hr tablet 15 mg, 15 mg, Oral, Daily, Smith, Rondell A, MD, 15 mg at 09/13/22 0815   metoprolol tartrate (LOPRESSOR) injection 5 mg, 5 mg, Intravenous, Q4H PRN, Amin, Ankit Chirag, MD   mometasone-formoterol (DULERA) 200-5 MCG/ACT inhaler 2 puff, 2 puff, Inhalation, BID, Fuller Plan A, MD, 2 puff at 09/13/22 0816   multivitamin (RENA-VIT) tablet 1 tablet, 1 tablet, Oral, QHS, Shawna Clamp, MD, 1 tablet at 09/12/22 2118    ondansetron (ZOFRAN) tablet 4 mg, 4 mg, Oral, Q6H PRN **OR** ondansetron (ZOFRAN) injection 4 mg, 4 mg, Intravenous, Q6H PRN, Smith, Rondell A, MD   senna-docusate (Senokot-S) tablet 1 tablet, 1 tablet, Oral, QHS PRN, Amin, Ankit Chirag, MD   sevelamer carbonate (RENVELA) tablet 800 mg, 800 mg, Oral, TID with meals, Smith, Rondell A, MD, 800 mg at 09/13/22 0815   sodium chloride flush (NS) 0.9 % injection 3  mL, 3 mL, Intravenous, Q12H, Smith, Rondell A, MD, 3 mL at 09/12/22 2323   sodium phosphate 30 mmol in dextrose 5 % 250 mL infusion, 30 mmol, Intravenous, Once, Shawna Clamp, MD, Last Rate: 43 mL/hr at 09/13/22 1041, 30 mmol at 09/13/22 1041   traZODone (DESYREL) tablet 50 mg, 50 mg, Oral, QHS PRN, Amin, Ankit Chirag, MD, 50 mg at 09/11/22 2143   umeclidinium bromide (INCRUSE ELLIPTA) 62.5 MCG/ACT 1 puff, 1 puff, Inhalation, Daily, Tamala Julian, Rondell A, MD, 1 puff at 09/13/22 0816  Allergies: No Known Allergies   Objective  Vital signs:  Temp:  [97.6 F (36.4 C)-98.9 F (37.2 C)] 97.6 F (36.4 C) (12/08 0952) Pulse Rate:  [73-87] 83 (12/08 0952) Resp:  [18] 18 (12/08 0952) BP: (162-177)/(79-93) 177/79 (12/08 0952) SpO2:  [98 %-100 %] 98 % (12/08 0952)  Mental Status Exam   Appearance and Grooming: Patient is laying on his side in hospital bed. Alert during most of the interview. The patient has no noticeable scent or odor. Motor activity: The patient's movement speed and his gait was not observed during encounter. There was no notable abnormal facial movements and no notable abnormal extremity movements. Behavior: The patient appears in no acute distress, and during the interview, was calm; he was able to follow commands and compliant to requests and made good eye contact. The patient did not appear internally or externally preoccupied. Attitude: Patient's attitude towards the interviewer was cooperative. Speech: The patient's speech was fluent, with good articulation, and with  appropriately placed inflections. The volume of his speech was normal and normal in quantity. The rate was normal with a normal rhythm. Responses were normal in latency. There were no abnormal patterns in speech. Mood: "Fair - a little better" Affect: Patient's affect is euthymic with broad range and even fluctuations; his affect is congruent with his stated mood. ------------------------------------------------------------------------------------------------------------------------- Thought Content The patient experiences no hallucinations. The patient describes no delusional thoughts; he denies thought insertion, denies thought withdrawal, denies thought interruption, and denies thought broadcasting. Patient at the time of interview endorses passive suicidal ideation, stating "it comes and goes" and denies active suicidal intent; he denies homicidal intent. Thought Process The patient's thought process is linear and is goal-directed. Insight The patient at the time of interview demonstrates fair insight  Judgement The patient over the past 24 hours demonstrates fair judgement  Memory: limited   Executive Functions  Concentration: Intact  Attention Span: Fair, can spell WORLD backwards.  Recall: Poor Fund of Knowledge: Not formally assessed  Alertness/Orientation: oriented to person, place, month   Assets  Assets: Armed forces logistics/support/administrative officer; Desire for Improvement; Financial Resources/Insurance  Sleep  Sleep:No data recorded  Physical Exam Constitutional:      Appearance: the patient is laying on his side in the hospital bed, alert  Pulmonary:     Effort: Pulmonary effort is normal.  Neurological:     General: No focal deficit present.     Mental Status: the patient is oriented to person, place, month, year  Review of Systems  Respiratory:  Negative for shortness of breath.   Cardiovascular:  Negative for chest pain.  Gastrointestinal:  Negative for abdominal pain,  constipation, diarrhea, nausea and vomiting.  Neurological:  Negative for headaches.  MSK: Positive for L sided pain that comes and goes   Blood pressure (!) 177/79, pulse 83, temperature 97.6 F (36.4 C), temperature source Oral, resp. rate 18, height '5\' 8"'$  (1.727 m), weight 69 kg, SpO2 98 %. Body mass index  is 23.13 kg/m.

## 2022-09-13 NOTE — Progress Notes (Signed)
Physical Therapy Evaluation Patient Details Name: Michael [name] MRN: [id number] DOB: 1950-12-18 Today's Date: 09/13/2022  History of Present Illness  71 y.o. male admitted 09/09/22 with chest pain, AKI on CKD stage IIIb, Suicidal ideations in the setting of depression. Recently hospitalized 11/30-12/1/23. PMHx:HTN, chronic HFrEF with recovered LVEF 60 to 65%, PAF on Eliquis, CAD s/p CABG and stenting, DM, CKD stage IIIb, HIV on HAART, and cocaine abuse.  Clinical Impression  Pt admitted with above diagnosis. Requires min assist for transfers from bed, and min guard with ambulation utilizing RW for support. Educated on safe use, gait symmetry, and LE exercises to improve functional mobility. Has been living in/out of motels, no assist available at d/c. Short term SNF would be a good option to help regain full independence. Pt requests housing assistance/guidance.  Pt currently with functional limitations due to the deficits listed below (see PT Problem List). Pt will benefit from skilled PT to increase their independence and safety with mobility to allow discharge to the venue listed below.          Recommendations for follow up therapy are one component of a multi-disciplinary discharge planning process, led by the attending physician.  Recommendations may be updated based on patient status, additional functional criteria and insurance authorization.  Follow Up Recommendations Skilled nursing-short term rehab (<3 hours/day) (Ordinarily would be appropriate for HHPT however pt without any assistance or supervision at home and therefore would benefit from additional care short term until safe to mobilize independently again.) Can patient physically be transported by private vehicle: Yes    Assistance Recommended at Discharge Set up Supervision/Assistance  Patient can return home with the following  A little help with walking and/or transfers;A little help with bathing/dressing/bathroom;Direct  supervision/assist for medications management;Direct supervision/assist for financial management;Assist for transportation;Assistance with cooking/housework    Equipment Recommendations  (TBD)  Recommendations for Other Services       Functional Status Assessment Patient has had a recent decline in their functional status and demonstrates the ability to make significant improvements in function in a reasonable and predictable amount of time.     Precautions / Restrictions Precautions Precautions: Fall Restrictions Weight Bearing Restrictions: No      Mobility  Bed Mobility Overal bed mobility: Modified Independent             General bed mobility comments: extra time    Transfers Overall transfer level: Needs assistance Equipment used: Rolling walker (2 wheels) Transfers: Sit to/from Stand Sit to Stand: Min assist           General transfer comment: Min assist for boost to stand. Educated on technique. Practiced x2 from low bed setting. Cues for hand placement ea time.    Ambulation/Gait Ambulation/Gait assistance: Min guard Gait Distance (Feet): 80 Feet Assistive device: Rolling walker (2 wheels) Gait Pattern/deviations: Step-through pattern, Decreased stride length, Shuffle, Narrow base of support Gait velocity: slow Gait velocity interpretation: <1.31 ft/sec, indicative of household ambulator   General Gait Details: Min guard for safety, very slow. Cues for AD use with RW instructions, no drifting noted. Stable with hands on RW for support. Intermittent narrowing of BOS, cues to correct and able to follow-through. No buckling during bout. Pt very fatigued with distance completed today.  Stairs            Wheelchair Mobility    Modified Rankin (Stroke Patients Only)       Balance Overall balance assessment: Needs assistance Sitting-balance support: No upper extremity supported, Feet  supported Sitting balance-Leahy Scale: Good     Standing  balance support: No upper extremity supported, During functional activity Standing balance-Leahy Scale: Fair                               Pertinent Vitals/Pain Pain Assessment Pain Assessment: 0-10 Pain Score: 2  Pain Location: Left sid Pain Descriptors / Indicators: Aching Pain Intervention(s): Monitored during session, Repositioned    Home Living Family/patient expects to be discharged to:: Unsure Living Arrangements: Alone Available Help at Discharge:  (none) Type of Home: Homeless (Sometimes stays in motel)           Home Equipment: None Additional Comments: Would like housing resources.    Prior Function Prior Level of Function : Independent/Modified Independent;History of Falls (last six months)             Mobility Comments: had progressed off of RW but states he was furniture walking. Had one fall in past month       Hand Dominance   Dominant Hand: Right    Extremity/Trunk Assessment   Upper Extremity Assessment Upper Extremity Assessment: Defer to OT evaluation    Lower Extremity Assessment Lower Extremity Assessment: Generalized weakness       Communication   Communication: No difficulties  Cognition Arousal/Alertness: Awake/alert Behavior During Therapy: WFL for tasks assessed/performed Overall Cognitive Status: Within Functional Limits for tasks assessed                                          General Comments      Exercises General Exercises - Lower Extremity Ankle Circles/Pumps: AROM, Both, 10 reps, Seated Long Arc Quad: Strengthening, Both, 5 reps, Seated   Assessment/Plan    PT Assessment Patient needs continued PT services  PT Problem List Decreased strength;Decreased activity tolerance;Decreased balance;Decreased mobility;Decreased knowledge of use of DME;Impaired sensation       PT Treatment Interventions DME instruction;Gait training;Functional mobility training;Therapeutic  activities;Therapeutic exercise;Balance training;Neuromuscular re-education;Patient/family education    PT Goals (Current goals can be found in the Care Plan section)  Acute Rehab PT Goals Patient Stated Goal: Get some physcial therapy, then go to substance abuse rehab. PT Goal Formulation: With patient Time For Goal Achievement: 09/27/22 Potential to Achieve Goals: Good    Frequency Min 3X/week     Co-evaluation               AM-PAC PT "6 Clicks" Mobility  Outcome Measure Help needed turning from your back to your side while in a flat bed without using bedrails?: None Help needed moving from lying on your back to sitting on the side of a flat bed without using bedrails?: None Help needed moving to and from a bed to a chair (including a wheelchair)?: A Little Help needed standing up from a chair using your arms (e.g., wheelchair or bedside chair)?: A Little Help needed to walk in hospital room?: A Little Help needed climbing 3-5 steps with a railing? : A Little 6 Click Score: 20    End of Session Equipment Utilized During Treatment: Gait belt Activity Tolerance: Patient tolerated treatment well Patient left: in chair;with call bell/phone within reach;with chair alarm set Nurse Communication: Mobility status PT Visit Diagnosis: Unsteadiness on feet (R26.81);Other abnormalities of gait and mobility (R26.89);History of falling (Z91.81);Muscle weakness (generalized) (M62.81);Difficulty in walking, not elsewhere  classified (R26.2)    Time: 1455-1531 PT Time Calculation (min) (ACUTE ONLY): 36 min   Charges:   PT Evaluation $PT Eval Low Complexity: 1 Low PT Treatments $Gait Training: 8-22 mins        [name], PT, DPT Physical Therapist Acute Rehabilitation Services [place]   Ellouise Newer 09/13/2022, 4:32 PM

## 2022-09-14 DIAGNOSIS — N179 Acute kidney failure, unspecified: Secondary | ICD-10-CM | POA: Diagnosis not present

## 2022-09-14 LAB — BASIC METABOLIC PANEL
Anion gap: 8 (ref 5–15)
BUN: 30 mg/dL — ABNORMAL HIGH (ref 8–23)
CO2: 18 mmol/L — ABNORMAL LOW (ref 22–32)
Calcium: 7.9 mg/dL — ABNORMAL LOW (ref 8.9–10.3)
Chloride: 117 mmol/L — ABNORMAL HIGH (ref 98–111)
Creatinine, Ser: 2.74 mg/dL — ABNORMAL HIGH (ref 0.61–1.24)
GFR, Estimated: 24 mL/min — ABNORMAL LOW (ref >60)
Glucose, Bld: 262 mg/dL — ABNORMAL HIGH (ref 70–99)
Potassium: 4.2 mmol/L (ref 3.5–5.1)
Sodium: 143 mmol/L (ref 135–145)

## 2022-09-14 LAB — GLUCOSE, CAPILLARY
Glucose-Capillary: 167 mg/dL — ABNORMAL HIGH (ref 70–99)
Glucose-Capillary: 102 mg/dL — ABNORMAL HIGH (ref 70–99)
Glucose-Capillary: 126 mg/dL — ABNORMAL HIGH (ref 70–99)
Glucose-Capillary: 208 mg/dL — ABNORMAL HIGH (ref 70–99)

## 2022-09-14 LAB — CBC
HCT: 21.8 % — ABNORMAL LOW (ref 39.0–52.0)
Hemoglobin: 7.1 g/dL — ABNORMAL LOW (ref 13.0–17.0)
MCH: 27.8 pg (ref 26.0–34.0)
MCHC: 32.6 g/dL (ref 30.0–36.0)
MCV: 85.5 fL (ref 80.0–100.0)
Platelets: 110 10*3/uL — ABNORMAL LOW (ref 150–400)
RBC: 2.55 MIL/uL — ABNORMAL LOW (ref 4.22–5.81)
RDW: 14.4 % (ref 11.5–15.5)
WBC: 4.5 10*3/uL (ref 4.0–10.5)
nRBC: 0 % (ref 0.0–0.2)

## 2022-09-14 LAB — HIV-1 RNA, PCR (GRAPH) RFX/GENO EDI
HIV-1 RNA BY PCR: <20 copies/mL
HIV-1 RNA Quant, Log: UNDETERMINED log10copy/mL

## 2022-09-14 LAB — MAGNESIUM: Magnesium: 1.6 mg/dL — ABNORMAL LOW (ref 1.7–2.4)

## 2022-09-14 MED ORDER — MAGNESIUM SULFATE 2 GM/50ML IV SOLN
2.0000 g | Freq: Once | INTRAVENOUS | Status: AC
  Administered 2022-09-14: 2 g via INTRAVENOUS
  Filled 2022-09-14: qty 50

## 2022-09-14 NOTE — Evaluation (Signed)
Occupational Therapy Evaluation Patient Details Name: Michael [name] MRN: [id number] DOB: 03-Jun-1951 Today's Date: 09/14/2022   History of Present Illness 71 y.o. male admitted 09/09/22 with chest pain, AKI on CKD stage IIIb, Suicidal ideations in the setting of depression. Recently hospitalized 11/30-12/1/23. PMHx:HTN, chronic HFrEF with recovered LVEF 60 to 65%, PAF on Eliquis, CAD s/p CABG and stenting, DM, CKD stage IIIb, HIV on HAART, and cocaine abuse.   Clinical Impression   Patient admitted for the diagnosis above.  PTA he admits to being homeless.  Patient currently presenting with generalized weakness and balance deficits.  He is needing up to [place] for basic mobility, and Mod A for lower body ADL.  Acute OT is indicated to maximize his functional status, and assist with the transition to next level of care.  SNF has been recommended for post acute rehab to address current deficits, and assist with a return to independence.        Recommendations for follow up therapy are one component of a multi-disciplinary discharge planning process, led by the attending physician.  Recommendations may be updated based on patient status, additional functional criteria and insurance authorization.   Follow Up Recommendations  Skilled nursing-short term rehab (<3 hours/day)     Assistance Recommended at Discharge Frequent or constant Supervision/Assistance  Patient can return home with the following Assist for transportation;A little help with walking and/or transfers;A little help with bathing/dressing/bathroom    Functional Status Assessment  Patient has had a recent decline in their functional status and demonstrates the ability to make significant improvements in function in a reasonable and predictable amount of time.  Equipment Recommendations  None recommended by OT    Recommendations for Other Services       Precautions / Restrictions Precautions Precautions:  Fall Restrictions Weight Bearing Restrictions: No      Mobility Bed Mobility Overal bed mobility: Modified Independent                  Transfers Overall transfer level: Needs assistance Equipment used: Rolling walker (2 wheels) Transfers: Sit to/from Stand Sit to Stand: Min guard                  Balance Overall balance assessment: Needs assistance Sitting-balance support: No upper extremity supported, Feet supported Sitting balance-Leahy Scale: Good     Standing balance support: Single extremity supported Standing balance-Leahy Scale: Fair                             ADL either performed or assessed with clinical judgement   ADL Overall ADL's : Needs assistance/impaired Eating/Feeding: Set up;Sitting   Grooming: Set up;Sitting   Upper Body Bathing: Set up;Sitting   Lower Body Bathing: Minimal assistance;Sit to/from stand   Upper Body Dressing : Set up;Sitting   Lower Body Dressing: Moderate assistance;Sit to/from stand   Toilet Transfer: Minimal Teacher, English as a foreign language;Ambulation                   Vision Patient Visual Report: Blurring of vision;No change from baseline       Perception     Praxis      Pertinent Vitals/Pain Pain Assessment Pain Assessment: No/denies pain Pain Intervention(s): Monitored during session     Hand Dominance Right   Extremity/Trunk Assessment Upper Extremity Assessment Upper Extremity Assessment: Generalized weakness   Lower Extremity Assessment Lower Extremity Assessment: Defer to PT evaluation   Cervical / Trunk Assessment  Cervical / Trunk Assessment: Normal   Communication Communication Communication: No difficulties   Cognition Arousal/Alertness: Awake/alert Behavior During Therapy: WFL for tasks assessed/performed Overall Cognitive Status: Within Functional Limits for tasks assessed                                       General Comments   VSS on RA     Exercises     Shoulder Instructions      Home Living Family/patient expects to be discharged to:: Private residence Living Arrangements: Alone   Type of Home: Homeless                       Home Equipment: None          Prior Functioning/Environment Prior Level of Function : Independent/Modified Independent;History of Falls (last six months)             Mobility Comments: Told OT he likes to use a SPC ADLs Comments: Patient stated he did not need assist at PLOF        OT Problem List: Decreased strength;Impaired balance (sitting and/or standing)      OT Treatment/Interventions: Self-care/ADL training;Therapeutic activities;Balance training;Patient/family education    OT Goals(Current goals can be found in the care plan section) Acute Rehab OT Goals Patient Stated Goal: Help with housing OT Goal Formulation: With patient Time For Goal Achievement: 09/27/22 Potential to Achieve Goals: Good ADL Goals Pt Will Perform Grooming: with supervision;standing Pt Will Perform Lower Body Dressing: with supervision;sit to/from stand Pt Will Transfer to Toilet: with supervision;ambulating;regular height toilet Pt/caregiver will Perform Home Exercise Program: Increased strength;Both right and left upper extremity;With theraband;With Supervision  OT Frequency: Min 2X/week    Co-evaluation              AM-PAC OT "6 Clicks" Daily Activity     Outcome Measure Help from another person eating meals?: None Help from another person taking care of personal grooming?: None Help from another person toileting, which includes using toliet, bedpan, or urinal?: A Little Help from another person bathing (including washing, rinsing, drying)?: A Little   Help from another person to put on and taking off regular lower body clothing?: A Little 6 Click Score: 17   End of Session Nurse Communication: Mobility status  Activity Tolerance: Patient tolerated treatment well Patient  left: in chair;with call bell/phone within reach;with chair alarm set  OT Visit Diagnosis: Unsteadiness on feet (R26.81);Muscle weakness (generalized) (M62.81)                Time: 1440-1500 OT Time Calculation (min): 20 min Charges:  OT General Charges $OT Visit: 1 Visit OT Evaluation $OT Eval Moderate Complexity: 1 Mod  09/14/2022  RP, OTR/L  Acute Rehabilitation Services  Office:  [phone]   Metta Clines 09/14/2022, 3:13 PM

## 2022-09-14 NOTE — Progress Notes (Signed)
PROGRESS NOTE    Michael [name]  VVO:[id number] DOB: 1951/08/21 DOA: 09/09/2022  PCP: [name], MD   Brief Narrative:  This 71 yrs old Male with PMH significant of HTN, chronic HFrEF with recovered LVEF 60 to 65%, PAF on Eliquis, CAD s/p CABG and stenting, DM, CKD stage IIIb, HIV on HAART, and cocaine abuse who presented in the ED with complaints of left-sided chest pain for one day. Patient had just recently been hospitalized from 11/30-12/1/23 with complains of chest pain. Patient was recommended to continue aspirin, Plavix, Imdur and restarted on atorvastatin.  He reports that he had not been taking his medications as prescribed since being discharged although he has them. He is admitted for AKI with Cr 6.3.  Patient has active suicidal ideations and psychiatry was consulted.   Assessment & Plan:   Principal Problem:   ARF (acute renal failure) ([name]) Active Problems:   Suicidal ideations   3-vessel CAD   Anemia   Essential hypertension   Paroxysmal A-fib ([name])   (HFpEF) heart failure with preserved ejection fraction ([name])   Elevated troponin   HIV (human immunodeficiency virus infection) ([place])   Cocaine use disorder ([name])   Cocaine abuse, continuous ([place])   History of pulmonary embolism   Uncontrolled type 2 diabetes mellitus with hyperglycemia ([name])   Human immunodeficiency virus (HIV) disease ([place])   AKI (acute kidney injury) ([place])   Polysubstance abuse ([place])  AKI on CKD stage IIIb: Baseline serum creatinine around 2.5. Creatinine on admission 6.3. UA: Negative.  Urine drug screen cocaine +. Renal ultrasound unremarkable. Continue IV fluids. Renal functions improving, Creatinine 6.3 > 4.63 > 4.42 > 3.92 >2.86 >2.75  Suicidal ideations in the setting of depression: Patient has thoughts about hurting himself without any obvious plan. Psychiatry was consulted. Initiated on 1:1 sitter  , discontinued 09/12/22. Continue Prozac 20 mg daily for depression. His  depression appears to be stable.  Patient endorses chronic SI but is currently denying SI and contracting for safety.   He is interested in inpatient rehab for substance use. TOC notified and looking for place.  CAD s/p CABG: Chronic atypical chest pain: Patient has been noncompliant with his medications. He has been evaluated by cardiology. He was advised to continue aspirin , Plavix, Imdur and Lipitor.   Essential hypertension: Continue Cardizem CD120 mg daily Continue hydralazine 10 mg IV q6hr as needed. Continue Hydralazine 50 mg q 8hrs for better BP control   Heart failure with preserved EF: Recent echo shows LVEF 55%. No RWMA Appears euvolemic at this time.  HIV+ Infectious disease consulted for resumption of HAART. Patient is non compliant due to mental health and substance abuse issues. There is no indication to start antiretroviral therapy at this time as he will not continue and ultimately leads to resistance.   Paroxysmal atrial fibrillation: Heart rate well controlled and remains in sinus rhythm. Continue diltiazem CD 120 mg daily. Not on A/C due to non compliance.   Anemia of chronic disease: Hemoglobin 9.6 which seems his baseline. Hb trending down 9.6 > 7.9 > 7.6>7.5 Transfuse if Hb drops below 7.0   Diabetes mellitus type 2 with hyperglycemia: Hemoglobin A1c 7.7. Continue regular insulin sliding scale.   History of PE: During recent hospitalization he was advised to discontinue Eliquis. Continue aspirin and Plavix.  Cocaine abuse: Patient admits of using cocaine 1 day prior to admission. Counseled to quit using it. Social worker consult  DVT prophylaxis: Heparin sq. Code Status: Full code Family  Communication: No family at bed side. Disposition Plan:   Status is: Inpatient Remains inpatient appropriate because:  AKI on CKD stage IIIb, suicidal ideation.  Psychiatry is consulted.  Patient is not medically clear for discharge. Awaiting transfer to  inpatient rehab   Consultants:  Psychiatry  Procedures: None Antimicrobials: None  Subjective: Patient was seen and examined at bedside. Overnight events noted. He remains comfortable.He endorses being noncompliant with his medications. Patient continues to use cocaine before admission, still has suicidal thoughts but no plan.   Objective: Vitals:   09/13/22 2028 09/14/22 0618 09/14/22 0810 09/14/22 0942  BP: (!) 157/86 (!) 145/84  (!) 153/83  Pulse: 79 81  (!) 101  Resp: '18 18  17  '$ Temp: 97.6 F (36.4 C) 98.4 F (36.9 C)  98.8 F (37.1 C)  TempSrc: Oral Oral  Oral  SpO2: 100% 97% 97% 98%  Weight:      Height:        Intake/Output Summary (Last 24 hours) at 09/14/2022 1051 Last data filed at 09/14/2022 7341 Gross per 24 hour  Intake 716.97 ml  Output 2125 ml  Net -1408.03 ml   Filed Weights   09/12/22 0758  Weight: 69 kg    Examination:  General exam: Appears comfortable, not in any acute distress, deconditioned. Respiratory system: CTA bilaterally, respiratory effort normal, RR 12. Cardiovascular system: S1 & S2 heard, regular rate and rhythm, no murmur. Gastrointestinal system: Abdomen is soft, non tender, non distended, BS+ Central nervous system: Alert and oriented x 3.  No focal neurological deficits. Extremities: No edema, no cyanosis, no clubbing. Skin: No rashes, lesions or ulcers Psychiatry: Judgement and insight appear normal. Mood & affect appropriate.     Data Reviewed: I have personally reviewed following labs and imaging studies  CBC: Recent Labs  Lab 09/09/22 0843 09/10/22 0913 09/11/22 0434 09/12/22 1521 09/14/22 0042  WBC 7.8 5.3 4.3 4.6 4.5  HGB 9.6* 7.9* 7.6* 7.5* 7.1*  HCT 31.3* 24.9* 22.5* 21.9* 21.8*  MCV 89.4 87.1 82.7 82.6 85.5  PLT 155 124* 130* 120* 937*   Basic Metabolic Panel: Recent Labs  Lab 09/10/22 1426 09/11/22 0434 09/12/22 1521 09/13/22 0551 09/14/22 0042  NA 136 138 139 141 143  K 4.7 4.6 4.8 4.7 4.2   CL 112* 112* 118* 118* 117*  CO2 17* 19* 17* 18* 18*  GLUCOSE 187* 262* 252* 160* 262*  BUN 64* 57* 39* 36* 30*  CREATININE 4.42* 3.92* 3.22* 2.86* 2.74*  CALCIUM 7.9* 7.5* 7.9* 7.9* 7.9*  MG  --  1.4* 2.1 1.8 1.6*  PHOS  --   --   --  1.9*  --    GFR: Estimated Creatinine Clearance: 23.6 mL/min (A) (by C-G formula based on SCr of 2.74 mg/dL (H)). Liver Function Tests: Recent Labs  Lab 09/09/22 0843  AST 24  ALT 27  ALKPHOS 93  BILITOT 0.5  PROT 7.8  ALBUMIN 4.0   No results for input(s): "LIPASE", "AMYLASE" in the last 168 hours. No results for input(s): "AMMONIA" in the last 168 hours. Coagulation Profile: No results for input(s): "INR", "PROTIME" in the last 168 hours. Cardiac Enzymes: Recent Labs  Lab 09/09/22 1000  CKTOTAL 215   BNP (last 3 results) No results for input(s): "PROBNP" in the last 8760 hours. HbA1C: No results for input(s): "HGBA1C" in the last 72 hours. CBG: Recent Labs  Lab 09/13/22 0720 09/13/22 1159 09/13/22 1633 09/13/22 2154 09/14/22 0721  GLUCAP 121* 111* 162* 183* 167*  Lipid Profile: No results for input(s): "CHOL", "HDL", "LDLCALC", "TRIG", "CHOLHDL", "LDLDIRECT" in the last 72 hours. Thyroid Function Tests: No results for input(s): "TSH", "T4TOTAL", "FREET4", "T3FREE", "THYROIDAB" in the last 72 hours. Anemia Panel: No results for input(s): "VITAMINB12", "FOLATE", "FERRITIN", "TIBC", "IRON", "RETICCTPCT" in the last 72 hours. Sepsis Labs: No results for input(s): "PROCALCITON", "LATICACIDVEN" in the last 168 hours.  No results found for this or any previous visit (from the past 240 hour(s)).   Radiology Studies: No results found.  Scheduled Meds:  aspirin  81 mg Oral Daily   atorvastatin  40 mg Oral Daily   clopidogrel  75 mg Oral Daily   diltiazem  120 mg Oral Daily   feeding supplement  237 mL Oral BID BM   FLUoxetine  20 mg Oral Daily   gabapentin  300 mg Oral QHS   heparin  5,000 Units Subcutaneous Q8H    hydrALAZINE  50 mg Oral Q8H   insulin aspart  0-5 Units Subcutaneous QHS   insulin aspart  0-9 Units Subcutaneous TID WC   isosorbide mononitrate  15 mg Oral Daily   mometasone-formoterol  2 puff Inhalation BID   multivitamin  1 tablet Oral QHS   sevelamer carbonate  800 mg Oral TID with meals   sodium chloride flush  3 mL Intravenous Q12H   umeclidinium bromide  1 puff Inhalation Daily   Continuous Infusions:  sodium chloride 100 mL/hr at 09/13/22 0756   magnesium sulfate bolus IVPB 2 g (09/14/22 1042)     LOS: 4 days    Time spent: 35 mins    Froilan Mclean, MD [facility]   If 7PM-7AM, please contact night-coverage

## 2022-09-14 NOTE — Plan of Care (Signed)
  Problem: Activity: Goal: Ability to tolerate increased activity will improve Outcome: Progressing   Problem: Cardiac: Goal: Ability to achieve and maintain adequate cardiovascular perfusion will improve Outcome: Progressing   

## 2022-09-15 DIAGNOSIS — N179 Acute kidney failure, unspecified: Secondary | ICD-10-CM | POA: Diagnosis not present

## 2022-09-15 LAB — HEMOGLOBIN AND HEMATOCRIT, BLOOD
HCT: 25 % — ABNORMAL LOW (ref 39.0–52.0)
Hemoglobin: 8.3 g/dL — ABNORMAL LOW (ref 13.0–17.0)

## 2022-09-15 LAB — CBC
HCT: 20.7 % — ABNORMAL LOW (ref 39.0–52.0)
Hemoglobin: 6.8 g/dL — CL (ref 13.0–17.0)
MCH: 28.2 pg (ref 26.0–34.0)
MCHC: 32.9 g/dL (ref 30.0–36.0)
MCV: 85.9 fL (ref 80.0–100.0)
Platelets: 100 K/uL — ABNORMAL LOW (ref 150–400)
RBC: 2.41 MIL/uL — ABNORMAL LOW (ref 4.22–5.81)
RDW: 14.9 % (ref 11.5–15.5)
WBC: 4.5 K/uL (ref 4.0–10.5)
nRBC: 0 % (ref 0.0–0.2)

## 2022-09-15 LAB — PREPARE RBC (CROSSMATCH)

## 2022-09-15 LAB — BASIC METABOLIC PANEL
Anion gap: 9 (ref 5–15)
BUN: 26 mg/dL — ABNORMAL HIGH (ref 8–23)
CO2: 16 mmol/L — ABNORMAL LOW (ref 22–32)
Calcium: 8.1 mg/dL — ABNORMAL LOW (ref 8.9–10.3)
Chloride: 118 mmol/L — ABNORMAL HIGH (ref 98–111)
Creatinine, Ser: 2.6 mg/dL — ABNORMAL HIGH (ref 0.61–1.24)
GFR, Estimated: 25 mL/min — ABNORMAL LOW (ref >60)
Glucose, Bld: 92 mg/dL (ref 70–99)
Potassium: 4.6 mmol/L (ref 3.5–5.1)
Sodium: 143 mmol/L (ref 135–145)

## 2022-09-15 LAB — MAGNESIUM: Magnesium: 1.8 mg/dL (ref 1.7–2.4)

## 2022-09-15 LAB — GLUCOSE, CAPILLARY
Glucose-Capillary: 146 mg/dL — ABNORMAL HIGH (ref 70–99)
Glucose-Capillary: 97 mg/dL (ref 70–99)
Glucose-Capillary: 112 mg/dL — ABNORMAL HIGH (ref 70–99)
Glucose-Capillary: 132 mg/dL — ABNORMAL HIGH (ref 70–99)

## 2022-09-15 LAB — PHOSPHORUS: Phosphorus: 3.2 mg/dL (ref 2.5–4.6)

## 2022-09-15 MED ORDER — SODIUM BICARBONATE 650 MG PO TABS
650.0000 mg | ORAL_TABLET | Freq: Three times a day (TID) | ORAL | Status: AC
  Administered 2022-09-15 – 2022-09-16 (×5): 650 mg via ORAL
  Filled 2022-09-15 (×5): qty 1

## 2022-09-15 MED ORDER — SODIUM CHLORIDE 0.9% IV SOLUTION: Freq: Once | INTRAVENOUS | Status: DC

## 2022-09-15 NOTE — Progress Notes (Signed)
Pt with a 9 beat run of vtach, asymptomatic, no complaints of pain. Awakened by this nurse for assessment. Dr. Bridgett Larsson notified, no new orders noted at this time.

## 2022-09-15 NOTE — Progress Notes (Signed)
   09/15/22 0449  Provider Notification  Provider Name/Title Dr. Kristopher Oppenheim  Date Provider Notified 09/15/22  Time Provider Notified [phone]  Method of Notification Page  Notification Reason Critical Result  Test performed and critical result Hbg-6.8  Date Critical Result Received 09/15/22  Time Critical Result Received 8502  Provider response No new orders  Date of Provider Response 09/15/22  Time of Provider Response [phone]

## 2022-09-15 NOTE — Progress Notes (Signed)
PROGRESS NOTE    Michael [name]  BOF:[id number] DOB: 04-09-1951 DOA: 09/09/2022  PCP: [name], MD   Brief Narrative:  This 71 yrs old Male with PMH significant of HTN, chronic HFrEF with recovered LVEF 60 to 65%, PAF on Eliquis, CAD s/p CABG and stenting, DM, CKD stage IIIb, HIV on HAART, and cocaine abuse who presented in the ED with complaints of left-sided chest pain for one day. Patient had just recently been hospitalized from 11/30-12/1/23 with complains of chest pain. Patient was recommended to continue aspirin, Plavix, Imdur and restarted on atorvastatin.  He reports that he had not been taking his medications as prescribed since being discharged although he has them. He is admitted for AKI with Cr 6.3.  Patient has active suicidal ideations and psychiatry was consulted.   Assessment & Plan:   Principal Problem:   ARF (acute renal failure) ([name]) Active Problems:   Suicidal ideations   3-vessel CAD   Anemia   Essential hypertension   Paroxysmal A-fib ([name])   (HFpEF) heart failure with preserved ejection fraction ([name])   Elevated troponin   HIV (human immunodeficiency virus infection) ([place])   Cocaine use disorder ([name])   Cocaine abuse, continuous ([place])   History of pulmonary embolism   Uncontrolled type 2 diabetes mellitus with hyperglycemia ([name])   Human immunodeficiency virus (HIV) disease ([place])   AKI (acute kidney injury) ([place])   Polysubstance abuse ([place])  AKI on CKD stage IIIb: Baseline serum creatinine around 2.5. Creatinine on admission 6.3. UA: Negative.  Urine drug screen cocaine +. Renal ultrasound unremarkable. Continue IV fluids. Renal functions improving, Creatinine 6.3 > 4.63 > 4.42 > 3.92 >2.86 >2.75 >2.60  Suicidal ideations in the setting of depression: Patient has thoughts about hurting himself without any obvious plan. Psychiatry was consulted. Initiated on 1:1 sitter  , discontinued 09/12/22. Continue Prozac 20 mg daily for depression. His  depression appears to be stable.  Patient endorses chronic SI but is currently denying SI and contracting for safety.   He is interested in inpatient rehab for substance use. TOC notified and looking for place.  CAD s/p CABG: Chronic atypical chest pain: Patient has been noncompliant with his medications. He has been evaluated by cardiology. He was advised to continue aspirin , Plavix, Imdur and Lipitor.   Essential hypertension: Continue Cardizem CD120 mg daily Continue hydralazine 10 mg IV q6hr as needed. Continue Hydralazine 50 mg q 8hrs for better BP control. BP well controlled now.   Heart failure with preserved EF: Recent echo shows LVEF 55%. No RWMA Appears euvolemic at this time.  HIV+ Infectious disease consulted for resumption of HAART. Patient is non compliant due to mental health and substance abuse issues. There is no indication to start antiretroviral therapy at this time as he will not continue and ultimately leads to resistance.   Paroxysmal atrial fibrillation: Heart rate well controlled and remains in sinus rhythm. Continue diltiazem CD 120 mg daily. Not on A/C due to non compliance.   Anemia of chronic disease: Hemoglobin 9.6 which seems his baseline. Hb trending down 9.6 > 7.9 > 7.6 >7.5 >6.8 There is no obvious visible bleeding. Transfuse 1 unit PRBC.  Follow-up Post transfusion Hb. Transfuse if Hb drops below 7.0   Diabetes mellitus type 2 with hyperglycemia: Hemoglobin A1c 7.7. Continue regular insulin sliding scale.   History of PE: During recent hospitalization he was advised to discontinue Eliquis. Continue aspirin and Plavix.  Cocaine abuse: Patient admits of using cocaine 1  day prior to admission. Counseled to quit using it. Social worker consult  DVT prophylaxis: Heparin sq. Code Status: Full code Family Communication: No family at bed side. Disposition Plan:   Status is: Inpatient Remains inpatient appropriate because:  AKI on CKD  stage IIIb, suicidal ideation.  Psychiatry is consulted.  Patient is now medically clear for discharge. Awaiting transfer to inpatient rehab   Consultants:  Psychiatry  Procedures: None Antimicrobials: None  Subjective: Patient was seen and examined at bedside. Overnight events noted. Patient reports doing much better.  Awaiting transfer to rehab. He endorses being noncompliant with his medications. Patient continues to use cocaine before admission, still has suicidal thoughts but no plan.  Objective: Vitals:   09/15/22 0200 09/15/22 0459 09/15/22 0833 09/15/22 0901  BP: 132/67 138/75  137/68  Pulse: 76 71  82  Resp: 17 17    Temp: 98 F (36.7 C) 98.3 F (36.8 C)  98 F (36.7 C)  TempSrc: Oral Oral  Oral  SpO2: 100% 100% 98% 96%  Weight:      Height:        Intake/Output Summary (Last 24 hours) at 09/15/2022 1031 Last data filed at 09/15/2022 0900 Gross per 24 hour  Intake 770 ml  Output 1250 ml  Net -480 ml   Filed Weights   09/12/22 0758  Weight: 69 kg   Examination:  General exam: Appears deconditioned, but comfortable, not in any distress. Respiratory system: CTA bilaterally, respiratory effort normal, RR 15 Cardiovascular system: S1-S2 heard, regular rate and rhythm, no murmur. Gastrointestinal system: Abdomen is soft, non tender, non distended, BS+ Central nervous system: Alert and oriented x 3, no focal neurological deficits. Extremities: No edema, no cyanosis, no clubbing. Skin: No rashes, lesions or ulcers Psychiatry: Judgement and insight appear normal. Mood & affect appropriate.   Data Reviewed: I have personally reviewed following labs and imaging studies  CBC: Recent Labs  Lab 09/10/22 0913 09/11/22 0434 09/12/22 1521 09/14/22 0042 09/15/22 0417  WBC 5.3 4.3 4.6 4.5 4.5  HGB 7.9* 7.6* 7.5* 7.1* 6.8*  HCT 24.9* 22.5* 21.9* 21.8* 20.7*  MCV 87.1 82.7 82.6 85.5 85.9  PLT 124* 130* 120* 110* 570*   Basic Metabolic Panel: Recent Labs   Lab 09/11/22 0434 09/12/22 1521 09/13/22 0551 09/14/22 0042 09/15/22 0417  NA 138 139 141 143 143  K 4.6 4.8 4.7 4.2 4.6  CL 112* 118* 118* 117* 118*  CO2 19* 17* 18* 18* 16*  GLUCOSE 262* 252* 160* 262* 92  BUN 57* 39* 36* 30* 26*  CREATININE 3.92* 3.22* 2.86* 2.74* 2.60*  CALCIUM 7.5* 7.9* 7.9* 7.9* 8.1*  MG 1.4* 2.1 1.8 1.6* 1.8  PHOS  --   --  1.9*  --  3.2   GFR: Estimated Creatinine Clearance: 24.8 mL/min (A) (by C-G formula based on SCr of 2.6 mg/dL (H)). Liver Function Tests: Recent Labs  Lab 09/09/22 0843  AST 24  ALT 27  ALKPHOS 93  BILITOT 0.5  PROT 7.8  ALBUMIN 4.0   No results for input(s): "LIPASE", "AMYLASE" in the last 168 hours. No results for input(s): "AMMONIA" in the last 168 hours. Coagulation Profile: No results for input(s): "INR", "PROTIME" in the last 168 hours. Cardiac Enzymes: Recent Labs  Lab 09/09/22 1000  CKTOTAL 215   BNP (last 3 results) No results for input(s): "PROBNP" in the last 8760 hours. HbA1C: No results for input(s): "HGBA1C" in the last 72 hours. CBG: Recent Labs  Lab 09/14/22 0721 09/14/22 1146  09/14/22 1629 09/14/22 2040 09/15/22 0720  GLUCAP 167* 102* 126* 208* 146*   Lipid Profile: No results for input(s): "CHOL", "HDL", "LDLCALC", "TRIG", "CHOLHDL", "LDLDIRECT" in the last 72 hours. Thyroid Function Tests: No results for input(s): "TSH", "T4TOTAL", "FREET4", "T3FREE", "THYROIDAB" in the last 72 hours. Anemia Panel: No results for input(s): "VITAMINB12", "FOLATE", "FERRITIN", "TIBC", "IRON", "RETICCTPCT" in the last 72 hours. Sepsis Labs: No results for input(s): "PROCALCITON", "LATICACIDVEN" in the last 168 hours.  No results found for this or any previous visit (from the past 240 hour(s)).   Radiology Studies: No results found.  Scheduled Meds:  sodium chloride   Intravenous Once   aspirin  81 mg Oral Daily   atorvastatin  40 mg Oral Daily   clopidogrel  75 mg Oral Daily   diltiazem  120 mg Oral  Daily   feeding supplement  237 mL Oral BID BM   FLUoxetine  20 mg Oral Daily   gabapentin  300 mg Oral QHS   heparin  5,000 Units Subcutaneous Q8H   hydrALAZINE  50 mg Oral Q8H   insulin aspart  0-5 Units Subcutaneous QHS   insulin aspart  0-9 Units Subcutaneous TID WC   isosorbide mononitrate  15 mg Oral Daily   mometasone-formoterol  2 puff Inhalation BID   multivitamin  1 tablet Oral QHS   sevelamer carbonate  800 mg Oral TID with meals   sodium chloride flush  3 mL Intravenous Q12H   umeclidinium bromide  1 puff Inhalation Daily   Continuous Infusions:  sodium chloride 100 mL/hr at 09/14/22 2232     LOS: 5 days    Time spent: 35 mins    Cris Gibby, MD [facility]   If 7PM-7AM, please contact night-coverage

## 2022-09-16 DIAGNOSIS — N179 Acute kidney failure, unspecified: Secondary | ICD-10-CM | POA: Diagnosis not present

## 2022-09-16 LAB — TYPE AND SCREEN
ABO/RH(D): O POS
Antibody Screen: NEGATIVE
Unit division: 0

## 2022-09-16 LAB — BASIC METABOLIC PANEL
Anion gap: 8 (ref 5–15)
BUN: 23 mg/dL (ref 8–23)
CO2: 15 mmol/L — ABNORMAL LOW (ref 22–32)
Calcium: 8.1 mg/dL — ABNORMAL LOW (ref 8.9–10.3)
Chloride: 119 mmol/L — ABNORMAL HIGH (ref 98–111)
Creatinine, Ser: 2.64 mg/dL — ABNORMAL HIGH (ref 0.61–1.24)
GFR, Estimated: 25 mL/min — ABNORMAL LOW (ref >60)
Glucose, Bld: 113 mg/dL — ABNORMAL HIGH (ref 70–99)
Potassium: 5.5 mmol/L — ABNORMAL HIGH (ref 3.5–5.1)
Sodium: 142 mmol/L (ref 135–145)

## 2022-09-16 LAB — CBC
HCT: 26.5 % — ABNORMAL LOW (ref 39.0–52.0)
Hemoglobin: 8.2 g/dL — ABNORMAL LOW (ref 13.0–17.0)
MCH: 27.9 pg (ref 26.0–34.0)
MCHC: 30.9 g/dL (ref 30.0–36.0)
MCV: 90.1 fL (ref 80.0–100.0)
Platelets: 101 10*3/uL — ABNORMAL LOW (ref 150–400)
RBC: 2.94 MIL/uL — ABNORMAL LOW (ref 4.22–5.81)
RDW: 15.6 % — ABNORMAL HIGH (ref 11.5–15.5)
WBC: 4.9 10*3/uL (ref 4.0–10.5)
nRBC: 0 % (ref 0.0–0.2)

## 2022-09-16 LAB — GLUCOSE, CAPILLARY
Glucose-Capillary: 108 mg/dL — ABNORMAL HIGH (ref 70–99)
Glucose-Capillary: 121 mg/dL — ABNORMAL HIGH (ref 70–99)
Glucose-Capillary: 152 mg/dL — ABNORMAL HIGH (ref 70–99)
Glucose-Capillary: 133 mg/dL — ABNORMAL HIGH (ref 70–99)

## 2022-09-16 LAB — BPAM RBC
Blood Product Expiration Date: 202401022359
ISSUE DATE / TIME: 202312101120
Unit Type and Rh: 5100

## 2022-09-16 LAB — MAGNESIUM: Magnesium: 1.7 mg/dL (ref 1.7–2.4)

## 2022-09-16 LAB — POTASSIUM: Potassium: 5 mmol/L (ref 3.5–5.1)

## 2022-09-16 MED ORDER — SODIUM ZIRCONIUM CYCLOSILICATE 10 G PO PACK
10.0000 g | PACK | Freq: Once | ORAL | Status: AC
  Administered 2022-09-16: 10 g via ORAL
  Filled 2022-09-16: qty 1

## 2022-09-16 NOTE — Progress Notes (Signed)
PROGRESS NOTE    Michael [name]  HGD:[id number] DOB: 08/01/1951 DOA: 09/09/2022  PCP: [name], MD   Brief Narrative:  This 71 yrs old Male with PMH significant of HTN, chronic HFrEF with recovered LVEF 60 to 65%, PAF on Eliquis, CAD s/p CABG and stenting, DM, CKD stage IIIb, HIV on HAART, and cocaine abuse who presented in the ED with complaints of left-sided chest pain for one day. Patient had just recently been hospitalized from 11/30-12/1/23 with complains of chest pain. Patient was recommended to continue aspirin, Plavix, Imdur and restarted on atorvastatin.  He reports that he had not been taking his medications as prescribed since being discharged although he has them. He is admitted for AKI with Cr 6.3.  Patient has active suicidal ideations and psychiatry was consulted.   Assessment & Plan:   Principal Problem:   ARF (acute renal failure) ([name]) Active Problems:   Suicidal ideations   3-vessel CAD   Anemia   Essential hypertension   Paroxysmal A-fib ([name])   (HFpEF) heart failure with preserved ejection fraction ([name])   Elevated troponin   HIV (human immunodeficiency virus infection) ([place])   Cocaine use disorder ([name])   Cocaine abuse, continuous ([place])   History of pulmonary embolism   Uncontrolled type 2 diabetes mellitus with hyperglycemia ([name])   Human immunodeficiency virus (HIV) disease ([place])   AKI (acute kidney injury) ([place])   Polysubstance abuse ([place])  AKI on CKD stage IIIb: Baseline serum creatinine around 2.5. Creatinine on admission 6.3. UA: Negative.  Urine drug screen cocaine +. Renal ultrasound unremarkable.Continue IV fluids. Renal functions improving, Creatinine 6.3 > 4.63 > 4.42 > 3.92 >2.86 >2.75 >2.60  Suicidal ideations in the setting of depression: Patient has thoughts about hurting himself without any obvious plan. Psychiatry was consulted. Initiated on 1:1 sitter  , discontinued 09/12/22. Continue Prozac 20 mg daily for depression. His  depression appears to be stable.  Patient endorses chronic SI but is currently denying SI and contracting for safety.   He is interested in inpatient rehab for substance use. TOC notified and looking for place.  CAD s/p CABG: Chronic atypical chest pain: Patient has been noncompliant with his medications. He has been evaluated by cardiology. He was advised to continue aspirin , Plavix, Imdur and Lipitor.   Essential hypertension: Continue Cardizem CD120 mg daily. Continue hydralazine 10 mg IV q6hr as needed. Continue Hydralazine 50 mg q 8hrs for better BP control. BP well controlled now.   Heart failure with preserved EF: Recent echo shows LVEF 55%. No RWMA Appears euvolemic at this time.  HIV+ Infectious disease consulted for resumption of HAART. Patient is non compliant due to mental health and substance abuse issues. There is no indication to start antiretroviral therapy at this time as he will not continue and ultimately leads to resistance.   Paroxysmal atrial fibrillation: Heart rate well controlled and remains in sinus rhythm. Continue diltiazem CD 120 mg daily. Not on A/C due to non compliance.   Anemia of chronic disease: Hemoglobin 9.6 which seems his baseline. Hb trending down 9.6 > 7.9 > 7.6 >7.5 >6.8 There is no obvious visible bleeding. Post transfusion Hb 8.2 Transfuse if Hb drops below 7.0   Diabetes mellitus type 2 with hyperglycemia: Hemoglobin A1c 7.7. Continue regular insulin sliding scale.   History of PE: During recent hospitalization he was advised to discontinue Eliquis. Continue aspirin and Plavix.  Cocaine abuse: Patient admits of using cocaine 1 day prior to admission. Counseled to  quit using it. Social worker consult  DVT prophylaxis: Heparin sq. Code Status: Full code Family Communication: No family at bed side. Disposition Plan:   Status is: Inpatient Remains inpatient appropriate because:  AKI on CKD stage IIIb, suicidal  ideation.  Psychiatry is consulted.  Patient is now medically clear for discharge. Awaiting transfer to inpatient rehab   Consultants:  Psychiatry  Procedures: None Antimicrobials: None  Subjective: Patient was seen and examined at bedside. Overnight events noted. Patient reports doing much better today. He reported good night sleep. He endorses being noncompliant with his medications. Patient continues to use cocaine before admission, still has suicidal thoughts but no plan.  Objective: Vitals:   09/15/22 2101 09/16/22 0542 09/16/22 0839 09/16/22 0920  BP:  131/69  133/72  Pulse:  72  78  Resp:  19  17  Temp:  98.7 F (37.1 C)  98 F (36.7 C)  TempSrc:  Oral  Oral  SpO2: 100% 99% 99% 98%  Weight:      Height:        Intake/Output Summary (Last 24 hours) at 09/16/2022 1155 Last data filed at 09/16/2022 1138 Gross per 24 hour  Intake 14217 ml  Output 1200 ml  Net 13017 ml   Filed Weights   09/12/22 0758  Weight: 69 kg   Examination:  General exam: Appears deconditioned, but comfortable, not in any distress. Respiratory system: CTA bilaterally, respiratory effort normal, RR 15 Cardiovascular system: S1-S2 heard, regular rate and rhythm, no murmur. Gastrointestinal system: Abdomen is soft, non tender, non distended, BS+ Central nervous system: Alert and oriented x 3, no focal neurological deficits. Extremities: No edema, no cyanosis, no clubbing. Skin: No rashes, lesions or ulcers Psychiatry: Judgement and insight appear normal. Mood & affect appropriate.   Data Reviewed: I have personally reviewed following labs and imaging studies  CBC: Recent Labs  Lab 09/11/22 0434 09/12/22 1521 09/14/22 0042 09/15/22 0417 09/15/22 1640 09/16/22 0534  WBC 4.3 4.6 4.5 4.5  --  4.9  HGB 7.6* 7.5* 7.1* 6.8* 8.3* 8.2*  HCT 22.5* 21.9* 21.8* 20.7* 25.0* 26.5*  MCV 82.7 82.6 85.5 85.9  --  90.1  PLT 130* 120* 110* 100*  --  025*   Basic Metabolic Panel: Recent Labs   Lab 09/12/22 1521 09/13/22 0551 09/14/22 0042 09/15/22 0417 09/16/22 0534 09/16/22 0835  NA 139 141 143 143 142  --   K 4.8 4.7 4.2 4.6 5.5* 5.0  CL 118* 118* 117* 118* 119*  --   CO2 17* 18* 18* 16* 15*  --   GLUCOSE 252* 160* 262* 92 113*  --   BUN 39* 36* 30* 26* 23  --   CREATININE 3.22* 2.86* 2.74* 2.60* 2.64*  --   CALCIUM 7.9* 7.9* 7.9* 8.1* 8.1*  --   MG 2.1 1.8 1.6* 1.8 1.7  --   PHOS  --  1.9*  --  3.2  --   --    GFR: Estimated Creatinine Clearance: 24.5 mL/min (A) (by C-G formula based on SCr of 2.64 mg/dL (H)). Liver Function Tests: No results for input(s): "AST", "ALT", "ALKPHOS", "BILITOT", "PROT", "ALBUMIN" in the last 168 hours.  No results for input(s): "LIPASE", "AMYLASE" in the last 168 hours. No results for input(s): "AMMONIA" in the last 168 hours. Coagulation Profile: No results for input(s): "INR", "PROTIME" in the last 168 hours. Cardiac Enzymes: No results for input(s): "CKTOTAL", "CKMB", "CKMBINDEX", "TROPONINI" in the last 168 hours.  BNP (last 3 results) No results  for input(s): "PROBNP" in the last 8760 hours. HbA1C: No results for input(s): "HGBA1C" in the last 72 hours. CBG: Recent Labs  Lab 09/15/22 1138 09/15/22 1536 09/15/22 2032 09/16/22 0723 09/16/22 1130  GLUCAP 97 112* 132* 108* 121*   Lipid Profile: No results for input(s): "CHOL", "HDL", "LDLCALC", "TRIG", "CHOLHDL", "LDLDIRECT" in the last 72 hours. Thyroid Function Tests: No results for input(s): "TSH", "T4TOTAL", "FREET4", "T3FREE", "THYROIDAB" in the last 72 hours. Anemia Panel: No results for input(s): "VITAMINB12", "FOLATE", "FERRITIN", "TIBC", "IRON", "RETICCTPCT" in the last 72 hours. Sepsis Labs: No results for input(s): "PROCALCITON", "LATICACIDVEN" in the last 168 hours.  No results found for this or any previous visit (from the past 240 hour(s)).   Radiology Studies: No results found.  Scheduled Meds:  sodium chloride   Intravenous Once   aspirin  81 mg  Oral Daily   atorvastatin  40 mg Oral Daily   clopidogrel  75 mg Oral Daily   diltiazem  120 mg Oral Daily   feeding supplement  237 mL Oral BID BM   FLUoxetine  20 mg Oral Daily   gabapentin  300 mg Oral QHS   heparin  5,000 Units Subcutaneous Q8H   hydrALAZINE  50 mg Oral Q8H   insulin aspart  0-5 Units Subcutaneous QHS   insulin aspart  0-9 Units Subcutaneous TID WC   isosorbide mononitrate  15 mg Oral Daily   mometasone-formoterol  2 puff Inhalation BID   multivitamin  1 tablet Oral QHS   sevelamer carbonate  800 mg Oral TID with meals   sodium bicarbonate  650 mg Oral TID   sodium chloride flush  3 mL Intravenous Q12H   umeclidinium bromide  1 puff Inhalation Daily   Continuous Infusions:  sodium chloride 100 mL/hr at 09/16/22 1121     LOS: 6 days    Time spent: 35 mins    Earl Zellmer, MD [facility]   If 7PM-7AM, please contact night-coverage

## 2022-09-16 NOTE — Plan of Care (Signed)
  Problem: Cardiac: Goal: Ability to achieve and maintain adequate cardiovascular perfusion will improve Outcome: Progressing   Problem: Skin Integrity: Goal: Risk for impaired skin integrity will decrease Outcome: Progressing

## 2022-09-16 NOTE — Progress Notes (Signed)
Mobility Specialist Progress Note:    09/16/22 1530  Mobility  Activity Ambulated with assistance in hallway  Level of Assistance Minimal assist, patient does 75% or more  Assistive Device Front wheel walker  Distance Ambulated (ft) 100 ft  Activity Response Tolerated well  Mobility Referral Yes  $Mobility charge 1 Mobility   Pt was eager for mobility session. Pt had a slow gait throughout, speed decreased as distance increased. C/o of slight dizziness during and after session. Left pt in bed with alarm set with all needs met.   Royetta Crochet Mobility Specialist Please contact via [employer]

## 2022-09-16 NOTE — Progress Notes (Signed)
Physical Therapy Treatment Patient Details Name: Michael [name] MRN: [id number] DOB: 06/01/1951 Today's Date: 09/16/2022   History of Present Illness 71 y.o. male admitted 09/09/22 with chest pain, AKI on CKD stage IIIb, Suicidal ideations in the setting of depression. Recently hospitalized 11/30-12/1/23. PMHx:HTN, chronic HFrEF with recovered LVEF 60 to 65%, PAF on Eliquis, CAD s/p CABG and stenting, DM, CKD stage IIIb, HIV on HAART, and cocaine abuse.    PT Comments    Continuing work on functional mobility and activity tolerance;  Session focused on completing the berg Balance Assessment, with results as follows:    09/16/22 1000  Standardized Balance Assessment  Standardized Balance Assessment  Berg Balance Test  Berg Balance Test  Sit to Stand 2  Standing Unsupported 2  Sitting with Back Unsupported but Feet Supported on Floor or Stool 4  Stand to Sit 2  Transfers 2  Standing Unsupported with Eyes Closed 3  Standing Ubsupported with Feet Together 2  From Standing, Reach Forward with Outstretched Arm 1  From Standing Position, Pick up Object from Floor 1  From Standing Position, Turn to Look Behind Over each Shoulder 2  Turn 360 Degrees 1  Standing Unsupported, Alternately Place Feet on Step/Stool 1  Standing Unsupported, One Foot in Front 2  Standing on One Leg 0  Total Score 25   A score of 25/56 is indicative of very high fall risk; Continue to recommend SNF for post-acute rehab to maximize independence and safety with mobility and ADLs;   Pt reported dizziness during session that increased with turning activities; Did not get the opportunity to look into VOR x1 performance, but I can't help but wonder if he has some vestibular dysfunction that is feeding into his high fall risk as well; will consider Vestibular Eval next session  Recommendations for follow up therapy are one component of a multi-disciplinary discharge planning process, led by the attending physician.   Recommendations may be updated based on patient status, additional functional criteria and insurance authorization.  Follow Up Recommendations  Skilled nursing-short term rehab (<3 hours/day) Can patient physically be transported by private vehicle: Yes   Assistance Recommended at Discharge Set up Supervision/Assistance  Patient can return home with the following A little help with walking and/or transfers;A little help with bathing/dressing/bathroom;Direct supervision/assist for medications management;Direct supervision/assist for financial management;Assist for transportation;Assistance with cooking/housework   Equipment Recommendations  Rolling walker (2 wheels);BSC/3in1    Recommendations for Other Services Other (comment) (Recommend a closer look at pt's vestibular function)     Precautions / Restrictions Precautions Precautions: Fall     Mobility  Bed Mobility Overal bed mobility: Modified Independent             General bed mobility comments: extra time    Transfers Overall transfer level: Needs assistance Equipment used: Rolling walker (2 wheels) Transfers: Sit to/from Stand Sit to Stand: Min assist           General transfer comment: Min assist for initial boost from low bed and to steady; tends to brace backs of legs against bed for stability    Ambulation/Gait Ambulation/Gait assistance: Min guard Gait Distance (Feet): 45 Feet Assistive device: Rolling walker (2 wheels) Gait Pattern/deviations: Step-through pattern, Decreased stride length, Shuffle, Narrow base of support Gait velocity: extremely slow     General Gait Details: Min guard for safety, very slow. Cues for AD use with RW instructions, no drifting noted. Stable with hands on RW for support. Intermittent narrowing of BOS, cues  to correct and able to follow-through. No buckling during bout. Pt very fatigued with distance completed today.   Stairs             Wheelchair Mobility     Modified Rankin (Stroke Patients Only)       Balance     Sitting balance-Leahy Scale: Good       Standing balance-Leahy Scale: Poor                   Standardized Balance Assessment Standardized Balance Assessment : Berg Balance Test Berg Balance Test Sit to Stand: Able to stand using hands after several tries Standing Unsupported: Able to stand 30 seconds unsupported Sitting with Back Unsupported but Feet Supported on Floor or Stool: Able to sit safely and securely 2 minutes Stand to Sit: Uses backs of legs against chair to control descent Transfers: Able to transfer with verbal cueing and /or supervision Standing Unsupported with Eyes Closed: Able to stand 10 seconds with supervision Standing Ubsupported with Feet Together: Able to place feet together independently but unable to hold for 30 seconds From Standing, Reach Forward with Outstretched Arm: Reaches forward but needs supervision From Standing Position, Pick up Object from Floor: Unable to pick up and needs supervision From Standing Position, Turn to Look Behind Over each Shoulder: Turn sideways only but maintains balance Turn 360 Degrees: Needs close supervision or verbal cueing Standing Unsupported, Alternately Place Feet on Step/Stool: Able to complete >2 steps/needs minimal assist Standing Unsupported, One Foot in Front: Able to take small step independently and hold 30 seconds Standing on One Leg: Unable to try or needs assist to prevent fall Total Score: 25        Cognition Arousal/Alertness: Awake/alert Behavior During Therapy: WFL for tasks assessed/performed Overall Cognitive Status: Within Functional Limits for tasks assessed                                          Exercises      General Comments General comments (skin integrity, edema, etc.): Pt with berg score of 25/56 indicative of very high fall risk; a score of less than 36 is predictive of chance of a fall close to  100% for community dwelling older adults      Pertinent Vitals/Pain Pain Assessment Pain Assessment: 0-10 Pain Score: 7  Pain Location: Low back pain Pain Descriptors / Indicators: Aching Pain Intervention(s): Monitored during session, Repositioned (LPN informed)    Home Living                          Prior Function            PT Goals (current goals can now be found in the care plan section) Acute Rehab PT Goals Patient Stated Goal: Get some physcial therapy, then go to substance abuse rehab. PT Goal Formulation: With patient Time For Goal Achievement: 09/27/22 Potential to Achieve Goals: Good Progress towards PT goals: Progressing toward goals (slowly)    Frequency    Min 3X/week      PT Plan Current plan remains appropriate    Co-evaluation              AM-PAC PT "6 Clicks" Mobility   Outcome Measure  Help needed turning from your back to your side while in a flat bed without using bedrails?: None Help needed moving  from lying on your back to sitting on the side of a flat bed without using bedrails?: A Little Help needed moving to and from a bed to a chair (including a wheelchair)?: A Little Help needed standing up from a chair using your arms (e.g., wheelchair or bedside chair)?: A Little Help needed to walk in hospital room?: A Little Help needed climbing 3-5 steps with a railing? : A Lot 6 Click Score: 18    End of Session Equipment Utilized During Treatment: Gait belt Activity Tolerance: Patient tolerated treatment well Patient left: in chair;with call bell/phone within reach;with chair alarm set Nurse Communication: Mobility status PT Visit Diagnosis: Unsteadiness on feet (R26.81);Other abnormalities of gait and mobility (R26.89);History of falling (Z91.81);Muscle weakness (generalized) (M62.81);Difficulty in walking, not elsewhere classified (R26.2)     Time: 8828-0034 PT Time Calculation (min) (ACUTE ONLY): 42 min  Charges:   $Gait Training: 8-22 mins $Therapeutic Activity: 23-37 mins                     Roney Marion, [place] Office [phone]    Colletta [place] 09/16/2022, 12:47 PM

## 2022-09-17 DIAGNOSIS — N179 Acute kidney failure, unspecified: Secondary | ICD-10-CM | POA: Diagnosis not present

## 2022-09-17 LAB — BASIC METABOLIC PANEL
Anion gap: 5 (ref 5–15)
BUN: 24 mg/dL — ABNORMAL HIGH (ref 8–23)
CO2: 18 mmol/L — ABNORMAL LOW (ref 22–32)
Calcium: 8 mg/dL — ABNORMAL LOW (ref 8.9–10.3)
Chloride: 119 mmol/L — ABNORMAL HIGH (ref 98–111)
Creatinine, Ser: 2.55 mg/dL — ABNORMAL HIGH (ref 0.61–1.24)
GFR, Estimated: 26 mL/min — ABNORMAL LOW (ref >60)
Glucose, Bld: 107 mg/dL — ABNORMAL HIGH (ref 70–99)
Potassium: 4.7 mmol/L (ref 3.5–5.1)
Sodium: 142 mmol/L (ref 135–145)

## 2022-09-17 LAB — PHOSPHORUS: Phosphorus: 3.3 mg/dL (ref 2.5–4.6)

## 2022-09-17 LAB — GLUCOSE, CAPILLARY
Glucose-Capillary: 92 mg/dL (ref 70–99)
Glucose-Capillary: 119 mg/dL — ABNORMAL HIGH (ref 70–99)
Glucose-Capillary: 159 mg/dL — ABNORMAL HIGH (ref 70–99)
Glucose-Capillary: 162 mg/dL — ABNORMAL HIGH (ref 70–99)

## 2022-09-17 LAB — CBC
HCT: 23.2 % — ABNORMAL LOW (ref 39.0–52.0)
Hemoglobin: 7.5 g/dL — ABNORMAL LOW (ref 13.0–17.0)
MCH: 27.8 pg (ref 26.0–34.0)
MCHC: 32.3 g/dL (ref 30.0–36.0)
MCV: 85.9 fL (ref 80.0–100.0)
Platelets: 104 10*3/uL — ABNORMAL LOW (ref 150–400)
RBC: 2.7 MIL/uL — ABNORMAL LOW (ref 4.22–5.81)
RDW: 15.4 % (ref 11.5–15.5)
WBC: 4.8 10*3/uL (ref 4.0–10.5)
nRBC: 0 % (ref 0.0–0.2)

## 2022-09-17 LAB — QUANTIFERON-TB GOLD PLUS (RQFGPL)
QuantiFERON Mitogen Value: >10 IU/mL
QuantiFERON Nil Value: 0.21 IU/mL
QuantiFERON TB1 Ag Value: 0.08 IU/mL
QuantiFERON TB2 Ag Value: 0.09 IU/mL

## 2022-09-17 LAB — MAGNESIUM: Magnesium: 1.5 mg/dL — ABNORMAL LOW (ref 1.7–2.4)

## 2022-09-17 LAB — QUANTIFERON-TB GOLD PLUS: QuantiFERON-TB Gold Plus: NEGATIVE

## 2022-09-17 MED ORDER — DOLUTEGRAVIR SODIUM 50 MG PO TABS: 50.0000 mg | ORAL_TABLET | Freq: Every day | ORAL | Status: DC

## 2022-09-17 MED ORDER — RILPIVIRINE HCL 25 MG PO TABS: 25.0000 mg | ORAL_TABLET | Freq: Every day | ORAL | Status: DC

## 2022-09-17 MED ORDER — MAGNESIUM SULFATE 2 GM/50ML IV SOLN
2.0000 g | Freq: Once | INTRAVENOUS | Status: AC
  Administered 2022-09-17: 2 g via INTRAVENOUS
  Filled 2022-09-17: qty 50

## 2022-09-17 MED ORDER — DOLUTEGRAVIR-RILPIVIRINE 50-25 MG PO TABS
1.0000 | ORAL_TABLET | Freq: Every day | ORAL | Status: DC
  Administered 2022-09-19 – 2022-10-18 (×30): 1 via ORAL
  Filled 2022-09-17 (×31): qty 1

## 2022-09-17 MED ORDER — DOLUTEGRAVIR-RILPIVIRINE 50-25 MG PO TABS: 1.0000 | ORAL_TABLET | Freq: Every day | ORAL | Status: DC

## 2022-09-17 NOTE — TOC Initial Note (Signed)
Transition of Care [facility]) - Initial/Assessment Note    Patient Details  Name: Michael [name] MRN: [id number] Date of Birth: 08-02-1951  [facility]) CM/SW Contact:    Joanne Chars, LCSW Phone Number: 09/17/2022, 11:57 AM  Clinical Narrative:    CSW met with pt regarding DC recommendation for SNF.  Pt agreeable to this. States he has been homeless for the last 2 years, most recently staying in a hotel.  No current services.  Pt contacts are sister [name] and son Monte, permission given to speak with them.     Pt has existing passr in [place] Must: [phone] B--no expiration date listed.  Referral sent out in hub for SNF.              Expected Discharge Plan: Skilled Nursing Facility Barriers to Discharge: Continued Medical Work up, SNF Pending bed offer   Patient Goals and CMS Choice Patient states their goals for this hospitalization and ongoing recovery are:: strengthen myself      Expected Discharge Plan and Services Expected Discharge Plan: [place] In-house Referral: Clinical Social Work   Post Acute Care Choice: [place] Living arrangements for the past 2 months: Hotel/Motel                                      Prior Living Arrangements/Services Living arrangements for the past 2 months: Hotel/Motel Lives with:: Self Patient language and need for interpreter reviewed:: Yes Do you feel safe going back to the place where you live?: Yes      Need for Family Participation in Patient Care: No (Comment) Care giver support system in place?: Yes (comment) Current home services: Other (comment) (none) Criminal Activity/Legal Involvement Pertinent to Current Situation/Hospitalization: No - Comment as needed  Activities of Daily Living Home Assistive Devices/Equipment: Cane (specify quad or straight) ADL Screening (condition at time of admission) Patient's cognitive ability adequate to safely complete daily  activities?: Yes Is the patient deaf or have difficulty hearing?: No Does the patient have difficulty seeing, even when wearing glasses/contacts?: Yes Does the patient have difficulty concentrating, remembering, or making decisions?: Yes Patient able to express need for assistance with ADLs?: Yes Does the patient have difficulty dressing or bathing?: Yes Independently performs ADLs?: Yes (appropriate for developmental age) Does the patient have difficulty walking or climbing stairs?: Yes Weakness of Legs: Both Weakness of Arms/Hands: Both  Permission Sought/Granted Permission sought to share information with : Family Supports Permission granted to share information with : Yes, Verbal Permission Granted  Share Information with NAME: sister [name], son Daryl  Permission granted to share info w AGENCY: SNF        Emotional Assessment Appearance:: Appears stated age Attitude/Demeanor/Rapport: Engaged Affect (typically observed): Appropriate, Pleasant Orientation: : Oriented to Self, Oriented to Place, Oriented to  Time, Oriented to Situation Alcohol / Substance Use: Illicit Drugs Psych Involvement: Yes (comment)  Admission diagnosis:  ARF (acute renal failure) ([place]) [N17.9] Polysubstance abuse ([place]) [F19.10] AKI (acute kidney injury) ([place]) [N17.9] Patient Active Problem List   Diagnosis Date Noted   Polysubstance abuse ([place]) 09/13/2022   ARF (acute renal failure) ([place]) 09/09/2022   (HFpEF) heart failure with preserved ejection fraction ([place]) 09/09/2022   Elevated troponin 09/09/2022   Impaired functional mobility, balance, and endurance 11/29/2021   Transfusion history 11/29/2021   Unstable balance 11/29/2021   Dilated cardiomyopathy ([place]) 11/29/2021   Arthritis  11/29/2021   Diabetic foot infection ([place]) 06/20/2021   Presence of aortocoronary bypass graft 04/16/2021   Iron deficiency anemia 04/04/2021   Atypical chest pain 04/03/2021   Anemia of chronic disease 12/14/2020    Uncontrolled type 2 diabetes mellitus with hyperglycemia ([place]) 08/14/2020   Idiopathic chronic gout of multiple sites without tophus 02/17/2020   Diabetic ulcer of toe of right foot associated with type 2 diabetes mellitus ([place]) 12/17/2019   Vitamin B12 deficiency 12/17/2019   Homelessness 12/13/2019   Cellulitis 12/07/2019   Osteomyelitis ([place]) 07/17/2019   Abscess or cellulitis of toe, right    Toe pain, right-second    MDD (major depressive disorder), recurrent episode, severe ([place]) 06/15/2019   Respiratory distress 04/12/2019   Pneumonia due to severe acute respiratory syndrome coronavirus 2 (SARS-CoV-2) 04/11/2019   COVID-19 virus infection 03/19/2019   Chest pain 10/03/2018   Malnutrition of moderate degree 09/21/2018   MDD (major depressive disorder), recurrent episode, moderate ([name])    Type II diabetes mellitus with renal manifestations ([place]) 05/04/2018   GERD (gastroesophageal reflux disease) 05/04/2018   S/P CABG (coronary artery bypass graft)    Left testicular pain    Depression 02/20/2018   Epididymo-orchitis, acute 02/20/2018   Essential hypertension 02/20/2018   Precordial chest pain 02/20/2018   AKI (acute kidney injury) ([place]) 02/14/2018   HCAP (healthcare-associated pneumonia) 02/14/2018   History of pulmonary embolism 07/04/2017   Acute hyponatremia 05/11/2017   Hyperglycemia due to type 2 diabetes mellitus ([place]) 05/11/2017   CHF (congestive heart failure) ([place]) 04/01/2017   Hepatitis C 12/30/2016   Substance induced mood disorder ([place]) 11/23/2016   Chronic ischemic heart disease 11/12/2016   Paroxysmal A-fib ([place]) 11/12/2016   Back pain 04/18/2016   S/P carotid endarterectomy 11/15/2015   Chronic obstructive pulmonary disease, unspecified ([place]) 10/22/2015   Stenosis of cervical spine with myelopathy ([place]) 10/20/2015   MDD (major depressive disorder), recurrent severe, without psychosis ([place]) 09/09/2015   History of fusion of cervical spine 08/28/2015    Cocaine-induced mood disorder ([place]) 08/14/2015   Cocaine abuse with cocaine-induced mood disorder ([place]) 08/14/2015   Gout 07/10/2015   Acute renal failure superimposed on stage 3 chronic kidney disease ([place]) 03/06/2015   Anemia 03/06/2015   Chronic kidney disease, stage III (moderate) ([place]) 03/06/2015   Hypoglycemia    Encounter for general adult medical examination with abnormal findings 02/09/2015   Cocaine use disorder, moderate, dependence ([place]) 12/13/2014   Substance or medication-induced depressive disorder with onset during withdrawal ([place]) 12/13/2014   Severe recurrent major depressive disorder with psychotic features ([place]) 12/12/2014   Major depressive disorder, recurrent, severe without psychotic features ([place])    Suicidal ideations 08/15/2014   Cervicalgia 06/28/2014   Lumbar radiculopathy, chronic 06/28/2014   Asthma, chronic 02/03/2014   S/P percutaneous transluminal coronary angioplasty 10/15/2013   3-vessel CAD 06/24/2013   ED (erectile dysfunction) of organic origin 07/07/2012   Cocaine abuse, continuous ([place]) 05/13/2012   Hypertension goal BP (blood pressure) < 140/80 04/29/2012   Chondromalacia of left knee 03/19/2012   Hyperlipidemia with target LDL less than 100 02/12/2012   Fibromyalgia 02/12/2012   Cocaine use disorder ([place]) 01/10/2012    Class: Acute   Human immunodeficiency virus (HIV) disease ([place]) 12/16/2011   HIV (human immunodeficiency virus infection) ([place]) 08/27/2011   Uncontrolled type 2 diabetes with neuropathy 10/17/2000   PCP:  [name], MD Pharmacy:   [name] [address] [place] [address] Phone: [phone] Fax:  [place] [address] [place] [address] Phone: [phone] Fax: [place], [place] - [address] [place] [address] Phone: [phone] Fax:  [phone]     Social Determinants of Health ([place]) Interventions    Readmission Risk Interventions    07/05/2022   10:08 AM  Readmission Risk Prevention Plan  Transportation Screening Complete  Medication Review ([place]) Complete  PCP or Specialist appointment within 3-5 days of discharge Complete  HRI or [place] Complete  SW Recovery Care/Counseling Consult Complete  [place] Not Applicable

## 2022-09-17 NOTE — Progress Notes (Signed)
ID Brief Update:   I have been discussing with bridge counselor about outpatient transition to care as it pertains for HIV for Michael [name]. I see now the plan will be to D/C to SNF.   His genotype was not able to be run given viral load was below threshold.  Will check HIV RNA quant in AM w/o reflex to geno.   Seems like Michael [name] was working well enough for him to maintain at least reasonable control over virus based on this surrogate finding.   Will go ahead and resume Michael [name] once daily with meals given he will have better access to food in SNF and supervised dispenses. No PPIs to be co-administered, also keep multivitamin > 6hrs apart from [place].   He has a follow up with me scheduled 09/26/22 for further decisions. Likely I will run [facility] at that point to ensure he is on optimal treatment.    Michael Madeira, MSN, NP-C Michael [name] for Infectious Disease [name] Pager: [phone] Office: [phone] [facility] Main Line: [place] Communication Welcome

## 2022-09-17 NOTE — Progress Notes (Signed)
Physical Therapy Treatment Patient Details Name: Michael [name] MRN: [id number] DOB: [birth year] Today's Date: 09/17/2022   History of Present Illness 71 y.o. male admitted 09/09/22 with chest pain, AKI on CKD stage IIIb, Suicidal ideations in the setting of depression. Recently hospitalized 11/30-12/1/23. PMHx:HTN, chronic HFrEF with recovered LVEF 60 to 65%, PAF on Eliquis, CAD s/p CABG and stenting, DM, CKD stage IIIb, HIV on HAART, and cocaine abuse.    PT Comments    Making progress towards functional goals albeit slowly. Still requires min assist for transfers, close guard for safety with ambulation due to intermittent instability. Distance limited by lower back pain which he reports is chronic but more bothersome lately. Reviewed exercises for LEs and core. In regards to dizziness reported from last visit, I do not think this is related to his peripheral vestibular system (see details below in blue.) Complains more of blurred vision Lt eye>Rt eye that has been ongoing for about 3 months. Patient will continue to benefit from skilled physical therapy services to further improve independence with functional mobility.    Recommendations for follow up therapy are one component of a multi-disciplinary discharge planning process, led by the attending physician.  Recommendations may be updated based on patient status, additional functional criteria and insurance authorization.  Follow Up Recommendations  Skilled nursing-short term rehab (<3 hours/day) Can patient physically be transported by private vehicle: Yes   Assistance Recommended at Discharge Set up Supervision/Assistance  Patient can return home with the following A little help with walking and/or transfers;A little help with bathing/dressing/bathroom;Direct supervision/assist for medications management;Direct supervision/assist for financial management;Assist for transportation;Assistance with cooking/housework   Equipment  Recommendations  Rolling walker (2 wheels);BSC/3in1    Recommendations for Other Services       Precautions / Restrictions Precautions Precautions: Fall Restrictions Weight Bearing Restrictions: No     Mobility  Bed Mobility Overal bed mobility: Modified Independent             General bed mobility comments: extra time    Transfers Overall transfer level: Needs assistance Equipment used: Rolling walker (2 wheels) Transfers: Sit to/from Stand Sit to Stand: Min assist           General transfer comment: Min assist for boost to stand and balance until BIL hands on RW for support. Cues for technique. Trunk heavily flexed to prior to standing upright. Posterior instability required assist to correct.    Ambulation/Gait Ambulation/Gait assistance: Min guard Gait Distance (Feet): 45 Feet Assistive device: Rolling walker (2 wheels) Gait Pattern/deviations: Step-through pattern, Decreased stride length, Shuffle, Narrow base of support Gait velocity: extremely slow Gait velocity interpretation: <1.31 ft/sec, indicative of household ambulator   General Gait Details: Distance still limited. Pt reports primarily due to increased back pain with distance. Still requires close guard for safety, showing intermittent posterior instability but improved with cues for anterior weight shift and pressure through RW for support. Very slow gait.   Stairs             Wheelchair Mobility    Modified Rankin (Stroke Patients Only)       Balance Overall balance assessment: Needs assistance Sitting-balance support: No upper extremity supported, Feet supported Sitting balance-Leahy Scale: Good     Standing balance support: Bilateral upper extremity supported Standing balance-Leahy Scale: Poor Standing balance comment: posterior LOB                            Cognition Arousal/Alertness:  Awake/alert Behavior During Therapy: WFL for tasks  assessed/performed Overall Cognitive Status: Within Functional Limits for tasks assessed                                          Exercises General Exercises - Lower Extremity Ankle Circles/Pumps: AROM, Both, 10 reps, Seated Quad Sets: Strengthening, Both, 10 reps, Seated Gluteal Sets: Strengthening, Both, 10 reps, Seated Long Arc Quad: Strengthening, Both, 10 reps, Seated Other Exercises Other Exercises: TrA contractions in sitting x 10    General Comments General comments (skin integrity, edema, etc.): Further assessment of vestibular system. No skew, head impulse test WNL, no nystagmus at rest, visual tracking with mild saccads horizontal. Unable to increase "dizziness" which is described as visual blurriness Lt eye >Rt. Pt reports constant symptoms. Do not feel this is related to peripheral vestibular system. Not positional dependent, but fluctuates in intensity throughout the day. Worse in AM.      Pertinent Vitals/Pain Pain Assessment Pain Assessment: Faces Faces Pain Scale: Hurts whole lot Pain Location: Low back pain Pain Descriptors / Indicators: Aching Pain Intervention(s): Monitored during session, Repositioned    Home Living                          Prior Function            PT Goals (current goals can now be found in the care plan section) Acute Rehab PT Goals Patient Stated Goal: Get some physcial therapy, then go to substance abuse rehab. PT Goal Formulation: With patient Time For Goal Achievement: 09/27/22 Potential to Achieve Goals: Good Progress towards PT goals: Progressing toward goals    Frequency    Min 3X/week      PT Plan Current plan remains appropriate    Co-evaluation              AM-PAC PT "6 Clicks" Mobility   Outcome Measure  Help needed turning from your back to your side while in a flat bed without using bedrails?: None Help needed moving from lying on your back to sitting on the side of a flat  bed without using bedrails?: A Little Help needed moving to and from a bed to a chair (including a wheelchair)?: A Little Help needed standing up from a chair using your arms (e.g., wheelchair or bedside chair)?: A Little Help needed to walk in hospital room?: A Little Help needed climbing 3-5 steps with a railing? : A Lot 6 Click Score: 18    End of Session Equipment Utilized During Treatment: Gait belt Activity Tolerance: Patient tolerated treatment well Patient left: in chair;with call bell/phone within reach;with chair alarm set;with nursing/sitter in room Nurse Communication: Mobility status PT Visit Diagnosis: Unsteadiness on feet (R26.81);Other abnormalities of gait and mobility (R26.89);History of falling (Z91.81);Muscle weakness (generalized) (M62.81);Difficulty in walking, not elsewhere classified (R26.2)     Time: 5188-4166 PT Time Calculation (min) (ACUTE ONLY): 22 min  Charges:  $Gait Training: 8-22 mins                     [name], PT, DPT Physical Therapist Acute Rehabilitation Services [place] [facility]    Ellouise Newer 09/17/2022, 12:30 PM

## 2022-09-17 NOTE — NC FL2 (Signed)
[place] LEVEL OF CARE FORM     IDENTIFICATION  Patient Name: Michael [name] Birthdate: 1951/04/28 Sex: male Admission Date (Current Location): 09/09/2022  [facility] and [place] Number:  Herbalist and Address:  [facility], [place] [address] [place], [place] [address]      Provider Number: [id number]  Attending Physician Name and Address:  Shawna Clamp, MD  Relative Name and Phone Number:  Melene Plan [phone]  [phone]    Current Level of Care: Hospital Recommended Level of Care: [place] Prior Approval Number:    Date Approved/Denied:   PASRR Number: [id number]  Discharge Plan: SNF    Current Diagnoses: Patient Active Problem List   Diagnosis Date Noted   Polysubstance abuse ([place]) 09/13/2022   ARF (acute renal failure) ([place]) 09/09/2022   (HFpEF) heart failure with preserved ejection fraction ([place]) 09/09/2022   Elevated troponin 09/09/2022   Impaired functional mobility, balance, and endurance 11/29/2021   Transfusion history 11/29/2021   Unstable balance 11/29/2021   Dilated cardiomyopathy ([place]) 11/29/2021   Arthritis 11/29/2021   Diabetic foot infection ([place]) 06/20/2021   Presence of aortocoronary bypass graft 04/16/2021   Iron deficiency anemia 04/04/2021   Atypical chest pain 04/03/2021   Anemia of chronic disease 12/14/2020   Uncontrolled type 2 diabetes mellitus with hyperglycemia ([place]) 08/14/2020   Idiopathic chronic gout of multiple sites without tophus 02/17/2020   Diabetic ulcer of toe of right foot associated with type 2 diabetes mellitus ([place]) 12/17/2019   Vitamin B12 deficiency 12/17/2019   Homelessness 12/13/2019   Cellulitis 12/07/2019   Osteomyelitis ([place]) 07/17/2019   Abscess or cellulitis of toe, right    Toe pain, right-second    MDD (major depressive disorder), recurrent episode, severe ([place]) 06/15/2019   Respiratory distress 04/12/2019   Pneumonia due  to severe acute respiratory syndrome coronavirus 2 (SARS-CoV-2) 04/11/2019   COVID-19 virus infection 03/19/2019   Chest pain 10/03/2018   Malnutrition of moderate degree 09/21/2018   MDD (major depressive disorder), recurrent episode, moderate ([name])    Type II diabetes mellitus with renal manifestations ([place]) 05/04/2018   GERD (gastroesophageal reflux disease) 05/04/2018   S/P CABG (coronary artery bypass graft)    Left testicular pain    Depression 02/20/2018   Epididymo-orchitis, acute 02/20/2018   Essential hypertension 02/20/2018   Precordial chest pain 02/20/2018   AKI (acute kidney injury) ([place]) 02/14/2018   HCAP (healthcare-associated pneumonia) 02/14/2018   History of pulmonary embolism 07/04/2017   Acute hyponatremia 05/11/2017   Hyperglycemia due to type 2 diabetes mellitus ([place]) 05/11/2017   CHF (congestive heart failure) ([place]) 04/01/2017   Hepatitis C 12/30/2016   Substance induced mood disorder ([place]) 11/23/2016   Chronic ischemic heart disease 11/12/2016   Paroxysmal A-fib ([place]) 11/12/2016   Back pain 04/18/2016   S/P carotid endarterectomy 11/15/2015   Chronic obstructive pulmonary disease, unspecified ([place]) 10/22/2015   Stenosis of cervical spine with myelopathy ([place]) 10/20/2015   MDD (major depressive disorder), recurrent severe, without psychosis ([place]) 09/09/2015   History of fusion of cervical spine 08/28/2015   Cocaine-induced mood disorder ([place]) 08/14/2015   Cocaine abuse with cocaine-induced mood disorder ([place]) 08/14/2015   Gout 07/10/2015   Acute renal failure superimposed on stage 3 chronic kidney disease ([place]) 03/06/2015   Anemia 03/06/2015   Chronic kidney disease, stage III (moderate) ([place]) 03/06/2015   Hypoglycemia    Encounter for general adult medical examination with abnormal findings 02/09/2015   Cocaine use disorder,  moderate, dependence ([place]) 12/13/2014   Substance or medication-induced depressive disorder with onset during withdrawal ([place])  12/13/2014   Severe recurrent major depressive disorder with psychotic features ([place]) 12/12/2014   Major depressive disorder, recurrent, severe without psychotic features ([place])    Suicidal ideations 08/15/2014   Cervicalgia 06/28/2014   Lumbar radiculopathy, chronic 06/28/2014   Asthma, chronic 02/03/2014   S/P percutaneous transluminal coronary angioplasty 10/15/2013   3-vessel CAD 06/24/2013   ED (erectile dysfunction) of organic origin 07/07/2012   Cocaine abuse, continuous ([place]) 05/13/2012   Hypertension goal BP (blood pressure) < 140/80 04/29/2012   Chondromalacia of left knee 03/19/2012   Hyperlipidemia with target LDL less than 100 02/12/2012   Fibromyalgia 02/12/2012   Cocaine use disorder ([place]) 01/10/2012   Human immunodeficiency virus (HIV) disease ([place]) 12/16/2011   HIV (human immunodeficiency virus infection) ([place]) 08/27/2011   Uncontrolled type 2 diabetes with neuropathy 10/17/2000    Orientation RESPIRATION BLADDER Height & Weight     Self, Time, Situation, Place  Normal Continent Weight: 170 lb 13.7 oz (77.5 kg) Height:  '5\' 8"'$  (172.7 cm)  BEHAVIORAL SYMPTOMS/MOOD NEUROLOGICAL BOWEL NUTRITION STATUS      Continent Diet (see discharge summary)  AMBULATORY STATUS COMMUNICATION OF NEEDS Skin   Supervision Verbally Normal                       Personal Care Assistance Level of Assistance  Bathing, Feeding, Dressing Bathing Assistance: Limited assistance Feeding assistance: Limited assistance Dressing Assistance: Limited assistance     Functional Limitations Info  Sight, Hearing, Speech Sight Info: Adequate Hearing Info: Adequate Speech Info: Adequate    SPECIAL CARE FACTORS FREQUENCY  PT (By licensed PT), OT (By licensed OT)     PT Frequency: 5x week OT Frequency: 5x week            Contractures Contractures Info: Not present    Additional Factors Info  Code Status, Allergies, Insulin Sliding Scale Code Status Info: full Allergies Info:  NKA   Insulin Sliding Scale Info: Novolog: see discharge summary       Current Medications (09/17/2022):  This is the current hospital active medication list Current Facility-Administered Medications  Medication Dose Route Frequency Provider Last Rate Last Admin   0.9 %  sodium chloride infusion (Manually program via Guardrails IV Fluids)   Intravenous Once Shawna Clamp, MD   Stopped at 09/15/22 2041   0.9 %  sodium chloride infusion   Intravenous Continuous Tamala Julian, Rondell A, MD 100 mL/hr at 09/17/22 1045 Infusion Verify at 09/17/22 1045   acetaminophen (TYLENOL) tablet 650 mg  650 mg Oral Q6H PRN Fuller Plan A, MD   650 mg at 09/16/22 2156   Or   acetaminophen (TYLENOL) suppository 650 mg  650 mg Rectal Q6H PRN Fuller Plan A, MD       aspirin chewable tablet 81 mg  81 mg Oral Daily Smith, Rondell A, MD   81 mg at 09/17/22 1046   atorvastatin (LIPITOR) tablet 40 mg  40 mg Oral Daily Smith, Rondell A, MD   40 mg at 09/17/22 1046   clopidogrel (PLAVIX) tablet 75 mg  75 mg Oral Daily Smith, Rondell A, MD   75 mg at 09/17/22 1046   diltiazem (CARDIZEM CD) 24 hr capsule 120 mg  120 mg Oral Daily Smith, Rondell A, MD   120 mg at 09/17/22 1045   feeding supplement (ENSURE ENLIVE / ENSURE PLUS) liquid 237 mL  237 mL Oral  BID BM Shawna Clamp, MD   237 mL at 09/17/22 1047   FLUoxetine (PROZAC) capsule 20 mg  20 mg Oral Daily Smith, Rondell A, MD   20 mg at 09/17/22 1045   gabapentin (NEURONTIN) capsule 300 mg  300 mg Oral QHS Smith, Rondell A, MD   300 mg at 09/16/22 2156   guaiFENesin (ROBITUSSIN) 100 MG/5ML liquid 5 mL  5 mL Oral Q4H PRN Amin, Ankit Chirag, MD       heparin injection 5,000 Units  5,000 Units Subcutaneous Q8H Smith, Rondell A, MD   5,000 Units at 09/17/22 0527   hydrALAZINE (APRESOLINE) injection 10 mg  10 mg Intravenous Q4H PRN Amin, Ankit Chirag, MD       hydrALAZINE (APRESOLINE) tablet 50 mg  50 mg Oral Q8H Shawna Clamp, MD   50 mg at 09/17/22 0528   insulin aspart  (novoLOG) injection 0-5 Units  0-5 Units Subcutaneous QHS Fuller Plan A, MD   2 Units at 09/14/22 2231   insulin aspart (novoLOG) injection 0-9 Units  0-9 Units Subcutaneous TID WC Smith, Rondell A, MD   2 Units at 09/16/22 1746   ipratropium-albuterol (DUONEB) 0.5-2.5 (3) MG/3ML nebulizer solution 3 mL  3 mL Nebulization Q4H PRN Amin, Ankit Chirag, MD   3 mL at 09/16/22 2212   isosorbide mononitrate (IMDUR) 24 hr tablet 15 mg  15 mg Oral Daily Smith, Rondell A, MD   15 mg at 09/17/22 1046   metoprolol tartrate (LOPRESSOR) injection 5 mg  5 mg Intravenous Q4H PRN Amin, Ankit Chirag, MD       mometasone-formoterol (DULERA) 200-5 MCG/ACT inhaler 2 puff  2 puff Inhalation BID Fuller Plan A, MD   2 puff at 09/17/22 0748   multivitamin (RENA-VIT) tablet 1 tablet  1 tablet Oral QHS Shawna Clamp, MD   1 tablet at 09/16/22 2156   ondansetron (ZOFRAN) tablet 4 mg  4 mg Oral Q6H PRN Fuller Plan A, MD       Or   ondansetron (ZOFRAN) injection 4 mg  4 mg Intravenous Q6H PRN Fuller Plan A, MD   4 mg at 09/15/22 1657   senna-docusate (Senokot-S) tablet 1 tablet  1 tablet Oral QHS PRN Damita Lack, MD   1 tablet at 09/16/22 0829   sevelamer carbonate (RENVELA) tablet 800 mg  800 mg Oral TID with meals Tamala Julian, Rondell A, MD   800 mg at 09/17/22 0745   sodium chloride flush (NS) 0.9 % injection 3 mL  3 mL Intravenous Q12H Smith, Rondell A, MD   3 mL at 09/17/22 1048   traZODone (DESYREL) tablet 50 mg  50 mg Oral QHS PRN Amin, Ankit Chirag, MD   50 mg at 09/16/22 2156   umeclidinium bromide (INCRUSE ELLIPTA) 62.5 MCG/ACT 1 puff  1 puff Inhalation Daily Fuller Plan A, MD   1 puff at 09/17/22 0747     Discharge Medications: Please see discharge summary for a list of discharge medications.  Relevant Imaging Results:  Relevant Lab Results:   Additional Information SSN: [id number]  Joanne Chars, LCSW

## 2022-09-17 NOTE — Progress Notes (Signed)
PROGRESS NOTE    Michael [name]  EVO:[id number] DOB: 27-May-1951 DOA: 09/09/2022  PCP: [name], MD   Brief Narrative:  This 71 yrs old Male with PMH significant of HTN, chronic HFrEF with recovered LVEF 60 to 65%, PAF on Eliquis, CAD s/p CABG and stenting, DM, CKD stage IIIb, HIV on HAART, and cocaine abuse who presented in the ED with complaints of left-sided chest pain for one day. Patient had just recently been hospitalized from 11/30-12/1/23 with complains of chest pain. Patient was recommended to continue aspirin, Plavix, Imdur and restarted on atorvastatin.  He reports that he had not been taking his medications as prescribed since being discharged although he has them. He is admitted for AKI with Cr 6.3.  Patient has active suicidal ideations and psychiatry was consulted.   Assessment & Plan:   Principal Problem:   ARF (acute renal failure) ([name]) Active Problems:   Suicidal ideations   3-vessel CAD   Anemia   Essential hypertension   Paroxysmal A-fib ([name])   (HFpEF) heart failure with preserved ejection fraction ([name])   Elevated troponin   HIV (human immunodeficiency virus infection) ([place])   Cocaine use disorder ([name])   Cocaine abuse, continuous ([place])   History of pulmonary embolism   Uncontrolled type 2 diabetes mellitus with hyperglycemia ([name])   Human immunodeficiency virus (HIV) disease ([place])   AKI (acute kidney injury) ([place])   Polysubstance abuse ([place])  AKI on CKD stage IIIb: Baseline serum creatinine around 2.5. Creatinine on admission 6.3. UA: Negative.  Urine drug screen cocaine +. Renal ultrasound unremarkable. Continue IV fluids. Renal functions improving,  Creatinine 6.3 > 4.63 > 4.42 > 3.92 >2.86 >2.75 >2.60>2.55  Suicidal ideations in the setting of depression: Patient has thoughts about hurting himself without any obvious plan. Psychiatry was consulted. Initiated on 1:1 sitter  , discontinued 09/12/22. Continue Prozac 20 mg daily for  depression. His depression appears to be stable. Patient endorses chronic SI but is currently denying SI and contracting for safety.   He is interested in inpatient rehab for substance use. TOC notified and looking for place.  CAD s/p CABG: Chronic atypical chest pain: Patient has been noncompliant with his medications. He has been evaluated by cardiology. He was advised to continue aspirin , Plavix, Imdur and Lipitor.   Essential hypertension: Continue Cardizem CD120 mg daily. Continue Hydralazine 50 mg q 8hrs for better BP control. Continue hydralazine 10 mg IV q6hr as needed. BP well controlled now.   Heart failure with preserved EF: Recent echo shows LVEF 55%. No RWMA Appears euvolemic at this time.  HIV+ Infectious disease consulted for resumption of HAART. Patient is non compliant due to mental health and substance abuse issues. There is no indication to start antiretroviral therapy at this time as he will not continue and ultimately leads to resistance.   Paroxysmal atrial fibrillation: Heart rate well controlled and remains in sinus rhythm. Continue diltiazem CD 120 mg daily. Not on A/C due to non compliance.   Anemia of chronic disease: Hemoglobin 9.6 which seems his baseline. Hb trended down 9.6 > 7.9 > 7.6 >7.5 >6.8 There is no obvious visible bleeding. S/p 1prbc  Hb 8.2 Transfuse if Hb drops below 7.0   Diabetes mellitus type 2 with hyperglycemia: Hemoglobin A1c 7.7. Continue regular insulin sliding scale.   History of PE: During recent hospitalization he was advised to discontinue Eliquis. Continue aspirin and Plavix.  Cocaine abuse: Patient admits of using cocaine 1 day prior to admission.  Counseled to quit using it. Social worker consulted, resources provided.  DVT prophylaxis: Heparin sq. Code Status: Full code Family Communication: No family at bed side. Disposition Plan:   Status is: Inpatient Remains inpatient appropriate because:  AKI on CKD  stage IIIb, suicidal ideation.  Psychiatry is consulted.  Patient is now medically clear for discharge. Awaiting transfer to inpatient rehab.   Consultants:  Psychiatry  Procedures: None Antimicrobials: None  Subjective: Patient was seen and examined at bedside. Overnight events noted. Patient reports doing much better today, reports intermittent suicidal ideations with no plan. He endorses being non compliant with his medications.   Objective: Vitals:   09/16/22 2000 09/16/22 2116 09/17/22 0433 09/17/22 0836  BP:  (!) 176/93 (!) 162/92 139/72  Pulse:  80 79 96  Resp:  '18 18 18  '$ Temp:  98.5 F (36.9 C) 98.5 F (36.9 C) 98 F (36.7 C)  TempSrc:  Oral Oral Oral  SpO2: 97% 100% 99% 96%  Weight:  77.5 kg    Height:        Intake/Output Summary (Last 24 hours) at 09/17/2022 1141 Last data filed at 09/17/2022 1100 Gross per 24 hour  Intake 8000.23 ml  Output 2060 ml  Net 5940.23 ml   Filed Weights   09/12/22 0758 09/16/22 2116  Weight: 69 kg 77.5 kg   Examination:  General exam: Appears deconditioned, but comfortable, not in any acute distress. Respiratory system: CTA bilaterally, respiratory effort normal, RR 13. Cardiovascular system: S1-S2 heard, regular rate and rhythm, no murmur. Gastrointestinal system: Abdomen is soft, non tender, non distended, BS+ Central nervous system: Alert and oriented x 3, no focal neurological deficits. Extremities: No edema, no cyanosis, no clubbing. Skin: No rashes, lesions or ulcers Psychiatry: Judgement and insight appear normal. Mood & affect appropriate.   Data Reviewed: I have personally reviewed following labs and imaging studies  CBC: Recent Labs  Lab 09/12/22 1521 09/14/22 0042 09/15/22 0417 09/15/22 1640 09/16/22 0534 09/17/22 0412  WBC 4.6 4.5 4.5  --  4.9 4.8  HGB 7.5* 7.1* 6.8* 8.3* 8.2* 7.5*  HCT 21.9* 21.8* 20.7* 25.0* 26.5* 23.2*  MCV 82.6 85.5 85.9  --  90.1 85.9  PLT 120* 110* 100*  --  101* 104*    Basic Metabolic Panel: Recent Labs  Lab 09/13/22 0551 09/14/22 0042 09/15/22 0417 09/16/22 0534 09/16/22 0835 09/17/22 0412  NA 141 143 143 142  --  142  K 4.7 4.2 4.6 5.5* 5.0 4.7  CL 118* 117* 118* 119*  --  119*  CO2 18* 18* 16* 15*  --  18*  GLUCOSE 160* 262* 92 113*  --  107*  BUN 36* 30* 26* 23  --  24*  CREATININE 2.86* 2.74* 2.60* 2.64*  --  2.55*  CALCIUM 7.9* 7.9* 8.1* 8.1*  --  8.0*  MG 1.8 1.6* 1.8 1.7  --  1.5*  PHOS 1.9*  --  3.2  --   --  3.3   GFR: Estimated Creatinine Clearance: 25.3 mL/min (A) (by C-G formula based on SCr of 2.55 mg/dL (H)). Liver Function Tests: No results for input(s): "AST", "ALT", "ALKPHOS", "BILITOT", "PROT", "ALBUMIN" in the last 168 hours.  No results for input(s): "LIPASE", "AMYLASE" in the last 168 hours. No results for input(s): "AMMONIA" in the last 168 hours. Coagulation Profile: No results for input(s): "INR", "PROTIME" in the last 168 hours. Cardiac Enzymes: No results for input(s): "CKTOTAL", "CKMB", "CKMBINDEX", "TROPONINI" in the last 168 hours.  BNP (last 3  results) No results for input(s): "PROBNP" in the last 8760 hours. HbA1C: No results for input(s): "HGBA1C" in the last 72 hours. CBG: Recent Labs  Lab 09/16/22 1130 09/16/22 1724 09/16/22 2118 09/17/22 0720 09/17/22 1126  GLUCAP 121* 152* 133* 92 119*   Lipid Profile: No results for input(s): "CHOL", "HDL", "LDLCALC", "TRIG", "CHOLHDL", "LDLDIRECT" in the last 72 hours. Thyroid Function Tests: No results for input(s): "TSH", "T4TOTAL", "FREET4", "T3FREE", "THYROIDAB" in the last 72 hours. Anemia Panel: No results for input(s): "VITAMINB12", "FOLATE", "FERRITIN", "TIBC", "IRON", "RETICCTPCT" in the last 72 hours. Sepsis Labs: No results for input(s): "PROCALCITON", "LATICACIDVEN" in the last 168 hours.  No results found for this or any previous visit (from the past 240 hour(s)).   Radiology Studies: No results found.  Scheduled Meds:  sodium  chloride   Intravenous Once   aspirin  81 mg Oral Daily   atorvastatin  40 mg Oral Daily   clopidogrel  75 mg Oral Daily   diltiazem  120 mg Oral Daily   feeding supplement  237 mL Oral BID BM   FLUoxetine  20 mg Oral Daily   gabapentin  300 mg Oral QHS   heparin  5,000 Units Subcutaneous Q8H   hydrALAZINE  50 mg Oral Q8H   insulin aspart  0-5 Units Subcutaneous QHS   insulin aspart  0-9 Units Subcutaneous TID WC   isosorbide mononitrate  15 mg Oral Daily   mometasone-formoterol  2 puff Inhalation BID   multivitamin  1 tablet Oral QHS   sevelamer carbonate  800 mg Oral TID with meals   sodium chloride flush  3 mL Intravenous Q12H   umeclidinium bromide  1 puff Inhalation Daily   Continuous Infusions:  sodium chloride 100 mL/hr at 09/17/22 1045   magnesium sulfate bolus IVPB 2 g (09/17/22 1045)     LOS: 7 days    Time spent: 35 mins    Kody Brandl, MD [facility]   If 7PM-7AM, please contact night-coverage

## 2022-09-17 NOTE — Progress Notes (Signed)
Mobility Specialist Progress Note:    09/17/22 1600  Mobility  Activity Ambulated with assistance in hallway  Level of Assistance Minimal assist, patient does 75% or more  Assistive Device Front wheel walker  Distance Ambulated (ft) 150 ft  Activity Response Tolerated fair  Mobility Referral Yes  $Mobility charge 1 Mobility   Pt was received in bed and was agreeable for mobility session. C/o pain in left foot as well as dizziness near EOS. 1 rest break was taken after pt alerted that vision was blurring and BP 170/97. Left in chair with all needs met.   Royetta Crochet Mobility Specialist Please contact via Solicitor or  Rehab office at [phone]

## 2022-09-18 DIAGNOSIS — I480 Paroxysmal atrial fibrillation: Secondary | ICD-10-CM | POA: Diagnosis not present

## 2022-09-18 DIAGNOSIS — N189 Chronic kidney disease, unspecified: Secondary | ICD-10-CM

## 2022-09-18 DIAGNOSIS — I10 Essential (primary) hypertension: Secondary | ICD-10-CM | POA: Diagnosis not present

## 2022-09-18 DIAGNOSIS — E1169 Type 2 diabetes mellitus with other specified complication: Secondary | ICD-10-CM

## 2022-09-18 DIAGNOSIS — I5032 Chronic diastolic (congestive) heart failure: Secondary | ICD-10-CM | POA: Diagnosis not present

## 2022-09-18 DIAGNOSIS — F1910 Other psychoactive substance abuse, uncomplicated: Secondary | ICD-10-CM

## 2022-09-18 DIAGNOSIS — N179 Acute kidney failure, unspecified: Secondary | ICD-10-CM | POA: Diagnosis not present

## 2022-09-18 DIAGNOSIS — E785 Hyperlipidemia, unspecified: Secondary | ICD-10-CM

## 2022-09-18 DIAGNOSIS — J449 Chronic obstructive pulmonary disease, unspecified: Secondary | ICD-10-CM

## 2022-09-18 LAB — GLUCOSE, CAPILLARY
Glucose-Capillary: 104 mg/dL — ABNORMAL HIGH (ref 70–99)
Glucose-Capillary: 107 mg/dL — ABNORMAL HIGH (ref 70–99)
Glucose-Capillary: 159 mg/dL — ABNORMAL HIGH (ref 70–99)
Glucose-Capillary: 156 mg/dL — ABNORMAL HIGH (ref 70–99)

## 2022-09-18 LAB — BASIC METABOLIC PANEL
Anion gap: 6 (ref 5–15)
BUN: 22 mg/dL (ref 8–23)
CO2: 17 mmol/L — ABNORMAL LOW (ref 22–32)
Calcium: 8.3 mg/dL — ABNORMAL LOW (ref 8.9–10.3)
Chloride: 117 mmol/L — ABNORMAL HIGH (ref 98–111)
Creatinine, Ser: 2.31 mg/dL — ABNORMAL HIGH (ref 0.61–1.24)
GFR, Estimated: 29 mL/min — ABNORMAL LOW (ref >60)
Glucose, Bld: 99 mg/dL (ref 70–99)
Potassium: 4.4 mmol/L (ref 3.5–5.1)
Sodium: 140 mmol/L (ref 135–145)

## 2022-09-18 LAB — PHOSPHORUS: Phosphorus: 2.8 mg/dL (ref 2.5–4.6)

## 2022-09-18 LAB — HIV-1 RNA QUANT-NO REFLEX-BLD
HIV 1 RNA Quant: <20 copies/mL
LOG10 HIV-1 RNA: UNDETERMINED log10copy/mL

## 2022-09-18 LAB — HEMOGLOBIN AND HEMATOCRIT, BLOOD
HCT: 23.4 % — ABNORMAL LOW (ref 39.0–52.0)
Hemoglobin: 8 g/dL — ABNORMAL LOW (ref 13.0–17.0)

## 2022-09-18 LAB — MAGNESIUM: Magnesium: 1.8 mg/dL (ref 1.7–2.4)

## 2022-09-18 MED ORDER — MAGNESIUM SULFATE 2 GM/50ML IV SOLN
2.0000 g | Freq: Once | INTRAVENOUS | Status: AC
  Administered 2022-09-18: 2 g via INTRAVENOUS
  Filled 2022-09-18: qty 50

## 2022-09-18 MED ORDER — LIDOCAINE 5 % EX PTCH
1.0000 | MEDICATED_PATCH | CUTANEOUS | Status: DC
  Administered 2022-09-18 – 2022-10-18 (×31): 1 via TRANSDERMAL
  Filled 2022-09-18 (×31): qty 1

## 2022-09-18 NOTE — Assessment & Plan Note (Addendum)
-  Continue diltiazem, hydralazine, and Imdur

## 2022-09-18 NOTE — Progress Notes (Signed)
Nutrition Follow-up  DOCUMENTATION CODES:   Non-severe (moderate) malnutrition in context of chronic illness  INTERVENTION:  - Continue Ensure Enlive po BID, each supplement provides 350 kcal and 20 grams of protein.  NUTRITION DIAGNOSIS:   Moderate Malnutrition related to chronic illness (CHF, CKD, HIV) as evidenced by moderate fat depletion, severe muscle depletion.  GOAL:   Patient will meet greater than or equal to 90% of their needs  MONITOR:   Supplement acceptance, Labs, PO intake, Weight trends  REASON FOR ASSESSMENT:   Malnutrition Screening Tool    ASSESSMENT:   71 year old male who presented to the ED on 12/04 with chest pain. PMH of HTN, CHF, PAF, CAD s/p CABG and stenting, CKD stage IIIb, HIV on HAART, cocaine abuse, T2DM. Pt admitted with AKI on CKD, suicidal ideation, chest pain.  Meds include: lipitor, sliding scale insulin, rena-vit. Labs reviewed.   Pt ate 75% of his breakfast this am. Intakes remain fair to good. Pt is likely meeting his nutritional needs at this time. Pt is currently pending SNF placement.   Diet Order:   Diet Order             Diet regular Room service appropriate? Yes; Fluid consistency: Thin  Diet effective now                   EDUCATION NEEDS:   Education needs have been addressed  Skin:  Skin Assessment: Reviewed RN Assessment  Last BM:  09/17/22  Height:   Ht Readings from Last 1 Encounters:  09/12/22 '5\' 8"'$  (1.727 m)    Weight:   Wt Readings from Last 1 Encounters:  09/17/22 79.7 kg    Ideal Body Weight:     BMI:  Body mass index is 26.72 kg/m.  Estimated Nutritional Needs:   Kcal:  2000-2200  Protein:  95-110 grams  Fluid:  >2.0 L  Thalia Bloodgood, RD, LDN, CNSC.

## 2022-09-18 NOTE — Assessment & Plan Note (Addendum)
Patient is not on anticoagulation secondary to history of high bleeding risk -Continue diltiazem

## 2022-09-18 NOTE — Assessment & Plan Note (Addendum)
Continue Incruse Ellipta, Dulera and Duoneb PRN

## 2022-09-18 NOTE — Hospital Course (Addendum)
Mr. Magwood was admitted to the hospital with the working diagnosis of acute renal failure on chronic renal disease.    71 yo male with the past medical history of hypertension, heart failure, paroxysmal atrial fibrillation, coronary artery disease, HIV, T2DM, cocaine use and CKD stage 3b who presented with chest pain. Recent hospitalization for chest pain 11/30 to 09/06/22, with was placed on medical therapy including aspirin, clopidogrel, atorvastatin and isosorbide. At home he had recurrent chest pain after using crack cocaine. He had left sided chest pain, associated with nausea. Patient homeless and having thoughts of ending his life (no specific plan). On his initial physical examination his blood pressure was 179/103, HR 87, RR 20 and 02 saturation 100%, lungs with no wheezing or rales, heart with S1 and S2 present and rhythmic, abdomen with no distention, positive right costovertebral tenderness, no lower extremity edema.    Na 135, K 5,1 CL 107 bicarbonate 15, glucose 123, bun 81, cr 6,32  Wbc 7,8 hgb 9,6 plt 155  Toxicology positive for cocaine.  Urine analysis SG 1,013, 6-10 rbc, 100 protein, negative leukocytes.    Chest radiograph with no cardiomegaly, no infiltrates, sternotomy wires and left atrial clip.    EKG 85 bpm, normal axis, normal intervals, sinus rhythm with poor R R wave progression, no significant ST segment or T wave changes.    Patient was placed on IV fluids with improvement in renal function.  US renal with no obstructive uropathy.    Psychiatry was consulted with recommendations to continue medical therapy. No indication for inpatient psych admission.    PT and OT have recommended patient to be transfer to SNF to contin with physical therapy.    12/14 Patient pending to be transfer to SNF.  12/15 progressive care meeting, patient not able to transfer to SNF, will plan to discharge home when physically more capable.  12/22 Patient continue waiting to be more  reconditioned to go home.

## 2022-09-18 NOTE — Progress Notes (Signed)
Occupational Therapy Treatment Patient Details Name: Michael [name] MRN: [id number] DOB: [birth year] Today's Date: 09/18/2022   History of present illness 71 y.o. male admitted 09/09/22 with chest pain, AKI on CKD stage IIIb, Suicidal ideations in the setting of depression. Recently hospitalized 11/30-12/1/23. PMHx:HTN, chronic HFrEF with recovered LVEF 60 to 65%, PAF on Eliquis, CAD s/p CABG and stenting, DM, CKD stage IIIb, HIV on HAART, and cocaine abuse.   OT comments  Pt making steady progress towards OT goals this session. Pt continues to present with dizziness with mobility, however feel dizziness is related to double vision that pt experiences in L eye, Diplopia resolves when L eye occluded therefore issued pt occlusion glasses to compensate for visual deficit and improve balance for higher level functional mobility.  Pt currently requires supervision for grooming tasks and MIN A for ambulatory ADL transfers with RW. Pt would continue to benefit from skilled occupational therapy while admitted and after d/c to address the below listed limitations in order to improve overall functional mobility and facilitate independence with BADL participation. DC plan remains appropriate, will follow acutely per POC.      Recommendations for follow up therapy are one component of a multi-disciplinary discharge planning process, led by the attending physician.  Recommendations may be updated based on patient status, additional functional criteria and insurance authorization.    Follow Up Recommendations  Skilled nursing-short term rehab (<3 hours/day)     Assistance Recommended at Discharge Frequent or constant Supervision/Assistance  Patient can return home with the following  Assist for transportation;A little help with walking and/or transfers;A little help with bathing/dressing/bathroom   Equipment Recommendations  None recommended by OT    Recommendations for Other Services       Precautions / Restrictions Precautions Precautions: Fall Restrictions Weight Bearing Restrictions: No       Mobility Bed Mobility Overal bed mobility: Modified Independent                  Transfers Overall transfer level: Needs assistance Equipment used: Rolling walker (2 wheels) Transfers: Sit to/from Stand Sit to Stand: Min assist           General transfer comment: initial MINA  to rise from EOB but progressed to min guard with + reps and time.     Balance Overall balance assessment: Needs assistance Sitting-balance support: No upper extremity supported, Feet supported Sitting balance-Leahy Scale: Good     Standing balance support: No upper extremity supported, During functional activity Standing balance-Leahy Scale: Fair Standing balance comment: close min guard for balance at sink d/t reports of dizziness                           ADL either performed or assessed with clinical judgement   ADL Overall ADL's : Needs assistance/impaired     Grooming: Wash/dry face;Oral care;Sitting;Standing;Supervision/safety;Set up Grooming Details (indicate cue type and reason): needed to sit during grooming d/t dizziness                 Toilet Transfer: Minimal assistance;Rolling walker (2 wheels);Ambulation Toilet Transfer Details (indicate cue type and reason): simulated via functional mobility with RW         Functional mobility during ADLs: Minimal assistance;Rolling walker (2 wheels) General ADL Comments: ADL participation impacted by visual deficits impacting balance    Extremity/Trunk Assessment Upper Extremity Assessment Upper Extremity Assessment: Generalized weakness   Lower Extremity Assessment Lower Extremity Assessment: Defer to PT  evaluation   Cervical / Trunk Assessment Cervical / Trunk Assessment: Normal    Vision Baseline Vision/History: 0 No visual deficits Patient Visual Report: Blurring of vision;No change from  baseline;Other (comment);Diplopia (pt reports double vision that improves when L eye occluded, issued pt occlusion glasses as compensatory method for double vision as pt reports vision affects his balance and reproted dizziness during mobility d/t blurry vision)     Perception Perception Perception: Within Functional Limits   Praxis Praxis Praxis: Intact    Cognition Arousal/Alertness: Awake/alert Behavior During Therapy: WFL for tasks assessed/performed Overall Cognitive Status: Within Functional Limits for tasks assessed                                          Exercises      Shoulder Instructions       General Comments feel pts double vision is related to previous stroke that pt reports was a year ago, issued pt occlusion glasses with nasal portion of L side occluded to compensate for visual deficits( also issued pt handout to increase carryover of occlusion glasses). did assess BP however BP does not seem to be cause of dizziness, feel his vision is impacting his balance    Pertinent Vitals/ Pain       Pain Assessment Pain Assessment: Faces Faces Pain Scale: Hurts little more Pain Location: Low back pain Pain Descriptors / Indicators: Discomfort, Grimacing Pain Intervention(s): Limited activity within patient's tolerance, Monitored during session, Repositioned  Home Living                                          Prior Functioning/Environment              Frequency  Min 2X/week        Progress Toward Goals  OT Goals(current goals can now be found in the care plan section)  Progress towards OT goals: Progressing toward goals  Acute Rehab OT Goals Patient Stated Goal: none stated Time For Goal Achievement: 09/27/22 Potential to Achieve Goals: Good  Plan Discharge plan remains appropriate;Frequency remains appropriate    Co-evaluation                 AM-PAC OT "6 Clicks" Daily Activity     Outcome Measure    Help from another person eating meals?: None Help from another person taking care of personal grooming?: A Little Help from another person toileting, which includes using toliet, bedpan, or urinal?: A Little Help from another person bathing (including washing, rinsing, drying)?: A Little Help from another person to put on and taking off regular upper body clothing?: None Help from another person to put on and taking off regular lower body clothing?: A Little 6 Click Score: 20    End of Session Equipment Utilized During Treatment: Gait belt;Rolling walker (2 wheels)  OT Visit Diagnosis: Unsteadiness on feet (R26.81);Muscle weakness (generalized) (M62.81)   Activity Tolerance Patient tolerated treatment well   Patient Left in chair;with call bell/phone within reach;with chair alarm set   Nurse Communication Mobility status        Time: 0921-1000 OT Time Calculation (min): 39 min  Charges: OT General Charges $OT Visit: 1 Visit OT Treatments $Self Care/Home Management : 38-52 mins  Harley Alto., COTA/L Acute Rehabilitation Services [phone]  Corinne Ports [facility] 09/18/2022, 10:59 AM

## 2022-09-18 NOTE — Assessment & Plan Note (Addendum)
-  Continue [place]

## 2022-09-18 NOTE — Progress Notes (Signed)
Progress Note   Patient: Michael [name] ZYS:[id number] DOB: Apr 16, 1951 DOA: 09/09/2022     8 DOS: the patient was seen and examined on 09/18/2022   Brief hospital course: Michael [name] was admitted to the hospital with the working diagnosis of acute renal failure on chronic renal disease.   71 yo male with the past medical history of hypertension, heart failure, paroxysmal atrial fibrillation, coronary artery disease, HIV, T2DM, cocaine use and CKD stage 3b who presented with chest pain. Recent hospitalization for chest pain 11/30 to 09/06/22, with was placed on medical therapy including aspirin, clopidogrel, atorvastatin and isosorbide. At home he had recurrent chest pain after using crack cocaine. He had left sided chest pain, associated with nausea. Patient homeless and having thoughts of ending his life (no specific plan). On his initial physical examination his blood pressure was 179/103, HR 87, RR 20 and 02 saturation 100%, lungs with no wheezing or rales, heart with S1 and S2 present and rhythmic, abdomen with no distention, positive right costovertebral tenderness, no lower extremity edema.   Na 135, K 5,1 CL 107 bicarbonate 15, glucose 123, bun 81, cr 6,32  Wbc 7,8 hgb 9,6 plt 155  Toxicology positive for cocaine.  Urine analysis SG 1,013, 6-10 rbc, 100 protein, negative leukocytes.   Chest radiograph with no cardiomegaly, no infiltrates, sternotomy wires and left atrial clip.   EKG 85 bpm, normal axis, normal intervals, sinus rhythm with poor R R wave progression, no significant ST segment or T wave changes.   Patient was placed on IV fluids with improvement in renal function.  US renal with no obstructive uropathy.   Psychiatry was consulted with recommendations to continue medical therapy. No indication for inpatient psych admission.   PT and OT have recommended patient to be transfer to SNF to contin with physical therapy.     Assessment and Plan: Acute kidney injury  superimposed on chronic kidney disease ([name]) Stage 4 CKD.   Renal function is back to baseline with serum cr at 2.31 with K at 4.4 and serum bicarbonate at 17, Mg 1,8   Anemia of chronic renal disease, follow up hgb is 8,0 Iron panel with serum iron 62, TIBC 274, Transferrin saturation 23 and ferritin 464, consistent with anemia of chronic renal diease.  Add 2 g mag sulfate to prevent hypomagnesemia.    Essential hypertension Continue blood pressure control with diltiazem, hydralazine and isosorbide.    Paroxysmal A-fib ([name]) Continue rate control with diltiazem, patient has been considered high bleeding risk, currently off anticoagulation.  Ok to discontinue telemetry.   (HFpEF) heart failure with preserved ejection fraction [facility]) Patient with no clinical sings of decompensated heart failure. Will dc IV fluids and continue blood pressure monitoring.   HIV (human immunodeficiency virus infection) ([place]) Continue with antiretroviral therapy.  Follow up as outpatient.    History of pulmonary embolism Currently patient is off anticoagulation.   Type 2 diabetes mellitus with hyperlipidemia ([name]) Continue glucose cover and monitoring with insulin sliding scale.  Continue with statin therapy.   Polysubstance abuse ([place]) Cocaine use, no acute intoxication Will need outpatient follow up.   Suicidal ideations Patient has been evaluated by psychiatry, plan to continue with trazodone and fluoxetine.   COPD (chronic obstructive pulmonary disease) ([place]) Patient with no clinical signs of exacerbation, continue with bronchodilator therapy.         Subjective: Patient with back pain and mild dyspnea, has been dizzy, no chest pain.   Physical Exam: Vitals:  09/17/22 0836 09/17/22 2112 09/18/22 0518 09/18/22 0840  BP: 139/72 (!) 177/105 (!) 160/90 (!) 160/85  Pulse: 96 77 81 90  Resp: '18  18 17  '$ Temp: 98 F (36.7 C) 97.9 F (36.6 C) 98.9 F (37.2 C) 98.9 F (37.2 C)   TempSrc: Oral Oral Oral Oral  SpO2: 96% 98% 93% 96%  Weight:  79.7 kg    Height:       Neurology awake and alert ENT with mild pallor Cardiovascular with S1 and S2 present and rhythmic with no gallops, rubs or murmurs Respiratory with no rales or wheezing, no rhonchi Abdomen with no distention  No lower extremity edema  Data Reviewed:    Family Communication: no family at the bedside   Disposition: Status is: Inpatient Remains inpatient appropriate because: pending transfer to SNF   Planned Discharge Destination: Skilled nursing facility    Author: Tawni Millers, MD 09/18/2022 12:19 PM  For on call review [email or web address].

## 2022-09-18 NOTE — Assessment & Plan Note (Addendum)
Noted. Not on anticoagulation.

## 2022-09-18 NOTE — Assessment & Plan Note (Addendum)
Noted. History of cocaine use.

## 2022-09-18 NOTE — Assessment & Plan Note (Addendum)
Stable. Not on diuretic therapy.

## 2022-09-18 NOTE — Progress Notes (Signed)
Mobility Specialist: Progress Note   09/18/22 1554  Mobility  Activity Ambulated with assistance in hallway  Level of Assistance Minimal assist, patient does 75% or more  Assistive Device Front wheel walker  Distance Ambulated (ft) 90 ft (45'x2)  Activity Response Tolerated fair  Mobility Referral Yes  $Mobility charge 1 Mobility   Post-Mobility: 77 HR, 149/78 (98) BP, 100% SpO2  Pt received in the bed and agreeable to mobility. MinA with bed mobility as well as to stand. C/o dizziness during ambulation requiring x1 seated break. C/o Lt flank pain as well, no rating given. Pt back to bed after session with call bell and phone in reach.   [name] Mobility Specialist Please contact via SecureChat or Rehab office at [phone]

## 2022-09-18 NOTE — Assessment & Plan Note (Addendum)
Creatinine of 6.32 on admission. Associated BUN of 81. Patient initially started on IV fluids with improvement of renal function towards baseline. Creatinine now steadily improving off of IV fluids.

## 2022-09-18 NOTE — TOC Progression Note (Signed)
Transition of Care [facility]) - Initial/Assessment Note    Patient Details  Name: Michael [name] MRN: [id number] Date of Birth: [birth year]  Transition of [facility]) CM/SW Contact:    Milinda Antis, LCSWA Phone Number: 09/18/2022, 9:32 AM  Clinical Narrative:                 No bed offers at this time.  TOC will continue to follow.   Expected Discharge Plan: Skilled Nursing Facility Barriers to Discharge: Continued Medical Work up, SNF Pending bed offer   Patient Goals and CMS Choice Patient states their goals for this hospitalization and ongoing recovery are:: strengthen myself      Expected Discharge Plan and Services Expected Discharge Plan: [place] In-house Referral: Clinical Social Work   Post Acute Care Choice: [place] Living arrangements for the past 2 months: Hotel/Motel                                      Prior Living Arrangements/Services Living arrangements for the past 2 months: Hotel/Motel Lives with:: Self Patient language and need for interpreter reviewed:: Yes Do you feel safe going back to the place where you live?: Yes      Need for Family Participation in Patient Care: No (Comment) Care giver support system in place?: Yes (comment) Current home services: Other (comment) (none) Criminal Activity/Legal Involvement Pertinent to Current Situation/Hospitalization: No - Comment as needed  Activities of Daily Living Home Assistive Devices/Equipment: Cane (specify quad or straight) ADL Screening (condition at time of admission) Patient's cognitive ability adequate to safely complete daily activities?: Yes Is the patient deaf or have difficulty hearing?: No Does the patient have difficulty seeing, even when wearing glasses/contacts?: Yes Does the patient have difficulty concentrating, remembering, or making decisions?: Yes Patient able to express need for assistance with ADLs?: Yes Does the patient have  difficulty dressing or bathing?: Yes Independently performs ADLs?: Yes (appropriate for developmental age) Does the patient have difficulty walking or climbing stairs?: Yes Weakness of Legs: Both Weakness of Arms/Hands: Both  Permission Sought/Granted Permission sought to share information with : Family Supports Permission granted to share information with : Yes, Verbal Permission Granted  Share Information with NAME: sister [name], son Harlow  Permission granted to share info w AGENCY: SNF        Emotional Assessment Appearance:: Appears stated age Attitude/Demeanor/Rapport: Engaged Affect (typically observed): Appropriate, Pleasant Orientation: : Oriented to Self, Oriented to Place, Oriented to  Time, Oriented to Situation Alcohol / Substance Use: Illicit Drugs Psych Involvement: Yes (comment)  Admission diagnosis:  ARF (acute renal failure) ([place]) [N17.9] Polysubstance abuse ([place]) [F19.10] AKI (acute kidney injury) ([place]) [N17.9] Patient Active Problem List   Diagnosis Date Noted   Polysubstance abuse ([place]) 09/13/2022   ARF (acute renal failure) ([place]) 09/09/2022   (HFpEF) heart failure with preserved ejection fraction ([place]) 09/09/2022   Elevated troponin 09/09/2022   Impaired functional mobility, balance, and endurance 11/29/2021   Transfusion history 11/29/2021   Unstable balance 11/29/2021   Dilated cardiomyopathy ([place]) 11/29/2021   Arthritis 11/29/2021   Diabetic foot infection ([place]) 06/20/2021   Presence of aortocoronary bypass graft 04/16/2021   Iron deficiency anemia 04/04/2021   Atypical chest pain 04/03/2021   Anemia of chronic disease 12/14/2020   Uncontrolled type 2 diabetes mellitus with hyperglycemia ([place]) 08/14/2020   Idiopathic chronic gout of multiple sites without tophus 02/17/2020  Diabetic ulcer of toe of right foot associated with type 2 diabetes mellitus ([place]) 12/17/2019   Vitamin B12 deficiency 12/17/2019   Homelessness 12/13/2019   Cellulitis  12/07/2019   Osteomyelitis ([place]) 07/17/2019   Abscess or cellulitis of toe, right    Toe pain, right-second    MDD (major depressive disorder), recurrent episode, severe ([place]) 06/15/2019   Respiratory distress 04/12/2019   Pneumonia due to severe acute respiratory syndrome coronavirus 2 (SARS-CoV-2) 04/11/2019   COVID-19 virus infection 03/19/2019   Chest pain 10/03/2018   Malnutrition of moderate degree 09/21/2018   MDD (major depressive disorder), recurrent episode, moderate ([name])    Type II diabetes mellitus with renal manifestations ([place]) 05/04/2018   GERD (gastroesophageal reflux disease) 05/04/2018   S/P CABG (coronary artery bypass graft)    Left testicular pain    Depression 02/20/2018   Epididymo-orchitis, acute 02/20/2018   Essential hypertension 02/20/2018   Precordial chest pain 02/20/2018   AKI (acute kidney injury) ([place]) 02/14/2018   HCAP (healthcare-associated pneumonia) 02/14/2018   History of pulmonary embolism 07/04/2017   Acute hyponatremia 05/11/2017   Hyperglycemia due to type 2 diabetes mellitus ([place]) 05/11/2017   CHF (congestive heart failure) ([place]) 04/01/2017   Hepatitis C 12/30/2016   Substance induced mood disorder ([place]) 11/23/2016   Chronic ischemic heart disease 11/12/2016   Paroxysmal A-fib ([place]) 11/12/2016   Back pain 04/18/2016   S/P carotid endarterectomy 11/15/2015   Chronic obstructive pulmonary disease, unspecified ([place]) 10/22/2015   Stenosis of cervical spine with myelopathy ([place]) 10/20/2015   MDD (major depressive disorder), recurrent severe, without psychosis ([place]) 09/09/2015   History of fusion of cervical spine 08/28/2015   Cocaine-induced mood disorder ([place]) 08/14/2015   Cocaine abuse with cocaine-induced mood disorder ([place]) 08/14/2015   Gout 07/10/2015   Acute renal failure superimposed on stage 3 chronic kidney disease ([place]) 03/06/2015   Anemia 03/06/2015   Chronic kidney disease, stage III (moderate) ([place]) 03/06/2015   Hypoglycemia     Encounter for general adult medical examination with abnormal findings 02/09/2015   Cocaine use disorder, moderate, dependence ([place]) 12/13/2014   Substance or medication-induced depressive disorder with onset during withdrawal ([place]) 12/13/2014   Severe recurrent major depressive disorder with psychotic features ([place]) 12/12/2014   Major depressive disorder, recurrent, severe without psychotic features ([place])    Suicidal ideations 08/15/2014   Cervicalgia 06/28/2014   Lumbar radiculopathy, chronic 06/28/2014   Asthma, chronic 02/03/2014   S/P percutaneous transluminal coronary angioplasty 10/15/2013   3-vessel CAD 06/24/2013   ED (erectile dysfunction) of organic origin 07/07/2012   Cocaine abuse, continuous ([place]) 05/13/2012   Hypertension goal BP (blood pressure) < 140/80 04/29/2012   Chondromalacia of left knee 03/19/2012   Hyperlipidemia with target LDL less than 100 02/12/2012   Fibromyalgia 02/12/2012   Cocaine use disorder ([name]) 01/10/2012    Class: Acute   Human immunodeficiency virus (HIV) disease ([place]) 12/16/2011   HIV (human immunodeficiency virus infection) ([place]) 08/27/2011   Uncontrolled type 2 diabetes with neuropathy 10/17/2000   PCP:  [name], MD Pharmacy:   [name] [address] [place] [address] Phone: [phone] Fax: [place] [address] [place] [address] Phone: [phone] Fax: [place], [place] [address] [place] [address] Phone: [phone] Fax: [phone]     Social Determinants of Health (SDOH) Interventions    Readmission Risk  Interventions    07/05/2022   10:08 AM  Readmission Risk Prevention Plan  Transportation Screening Complete  Medication Review ([place]) Complete  PCP or Specialist appointment within 3-5 days of discharge Complete  HRI or  [place] Complete  SW Recovery Care/Counseling Consult Complete  [place] Not Applicable

## 2022-09-18 NOTE — Assessment & Plan Note (Addendum)
Hemoglobin A1C of 7.7%. Adequately controlled for age.

## 2022-09-18 NOTE — Assessment & Plan Note (Addendum)
Patient has been evaluated by psychiatry, plan to continue with fluoxetine.  Change trazodone to schedule to improve sleep.  No further suicidal precautions needed.

## 2022-09-19 DIAGNOSIS — I10 Essential (primary) hypertension: Secondary | ICD-10-CM | POA: Diagnosis not present

## 2022-09-19 DIAGNOSIS — I5032 Chronic diastolic (congestive) heart failure: Secondary | ICD-10-CM | POA: Diagnosis not present

## 2022-09-19 DIAGNOSIS — I480 Paroxysmal atrial fibrillation: Secondary | ICD-10-CM | POA: Diagnosis not present

## 2022-09-19 DIAGNOSIS — N179 Acute kidney failure, unspecified: Secondary | ICD-10-CM | POA: Diagnosis not present

## 2022-09-19 LAB — BASIC METABOLIC PANEL
Anion gap: 5 (ref 5–15)
BUN: 24 mg/dL — ABNORMAL HIGH (ref 8–23)
CO2: 18 mmol/L — ABNORMAL LOW (ref 22–32)
Calcium: 8.3 mg/dL — ABNORMAL LOW (ref 8.9–10.3)
Chloride: 115 mmol/L — ABNORMAL HIGH (ref 98–111)
Creatinine, Ser: 2.55 mg/dL — ABNORMAL HIGH (ref 0.61–1.24)
GFR, Estimated: 26 mL/min — ABNORMAL LOW (ref >60)
Glucose, Bld: 101 mg/dL — ABNORMAL HIGH (ref 70–99)
Potassium: 4.4 mmol/L (ref 3.5–5.1)
Sodium: 138 mmol/L (ref 135–145)

## 2022-09-19 LAB — GLUCOSE, CAPILLARY
Glucose-Capillary: 99 mg/dL (ref 70–99)
Glucose-Capillary: 130 mg/dL — ABNORMAL HIGH (ref 70–99)
Glucose-Capillary: 130 mg/dL — ABNORMAL HIGH (ref 70–99)
Glucose-Capillary: 150 mg/dL — ABNORMAL HIGH (ref 70–99)
Glucose-Capillary: 171 mg/dL — ABNORMAL HIGH (ref 70–99)

## 2022-09-19 MED ORDER — TRAZODONE HCL 50 MG PO TABS
50.0000 mg | ORAL_TABLET | Freq: Every day | ORAL | Status: DC
  Administered 2022-09-19 – 2022-10-17 (×29): 50 mg via ORAL
  Filled 2022-09-19 (×28): qty 1

## 2022-09-19 MED ORDER — GABAPENTIN 300 MG PO CAPS
300.0000 mg | ORAL_CAPSULE | Freq: Three times a day (TID) | ORAL | Status: DC
  Administered 2022-09-19 (×3): 300 mg via ORAL
  Filled 2022-09-19 (×3): qty 1

## 2022-09-19 NOTE — Progress Notes (Signed)
OT Cancellation Note  Patient Details Name: Michael [name] MRN: [id number] DOB: 06-Aug-1951   Cancelled Treatment:    Reason Eval/Treat Not Completed: Fatigue/lethargy limiting ability to participate pt asleep upon arrival, politely asking to rest, will f/u as time allows. Pt did state occulusion glasses help with dizziness yesterday during functional mobility.   Harley Alto., COTA/L Acute Rehabilitation Services [phone]   Precious Haws 09/19/2022, 1:32 PM

## 2022-09-19 NOTE — Progress Notes (Signed)
Progress Note   Patient: Michael [name] ZTI:[id number] DOB: [birth year] DOA: 09/09/2022     9 DOS: the patient was seen and examined on 09/19/2022   Brief hospital course: Michael [name] was admitted to the hospital with the working diagnosis of acute renal failure on chronic renal disease.   71 yo male with the past medical history of hypertension, heart failure, paroxysmal atrial fibrillation, coronary artery disease, HIV, T2DM, cocaine use and CKD stage 3b who presented with chest pain. Recent hospitalization for chest pain 11/30 to 09/06/22, with was placed on medical therapy including aspirin, clopidogrel, atorvastatin and isosorbide. At home he had recurrent chest pain after using crack cocaine. He had left sided chest pain, associated with nausea. Patient homeless and having thoughts of ending his life (no specific plan). On his initial physical examination his blood pressure was 179/103, HR 87, RR 20 and 02 saturation 100%, lungs with no wheezing or rales, heart with S1 and S2 present and rhythmic, abdomen with no distention, positive right costovertebral tenderness, no lower extremity edema.   Na 135, K 5,1 CL 107 bicarbonate 15, glucose 123, bun 81, cr 6,32  Wbc 7,8 hgb 9,6 plt 155  Toxicology positive for cocaine.  Urine analysis SG 1,013, 6-10 rbc, 100 protein, negative leukocytes.   Chest radiograph with no cardiomegaly, no infiltrates, sternotomy wires and left atrial clip.   EKG 85 bpm, normal axis, normal intervals, sinus rhythm with poor R R wave progression, no significant ST segment or T wave changes.   Patient was placed on IV fluids with improvement in renal function.  US renal with no obstructive uropathy.   Psychiatry was consulted with recommendations to continue medical therapy. No indication for inpatient psych admission.   PT and OT have recommended patient to be transfer to SNF to contin with physical therapy.   12/14 Patient pending to be transfer to SNF.    Assessment and Plan: Acute kidney injury superimposed on chronic kidney disease ([name]) Stage 4 CKD.   Patient with poor oral intake.  Renal function continue to be stable with serum cr at 2.5 with K at 4.4 and serum bicarbonate at 18. Na 138 and Cl 115.   Anemia of chronic renal disease, follow up hgb is 8,0 Iron panel with serum iron 62, TIBC 274, Transferrin saturation 23 and ferritin 464, consistent with anemia of chronic renal diease.   Follow up renal function in 48 hrs Continue to hold on IV fluids, encourage po intake.   Essential hypertension Continue blood pressure control with diltiazem, hydralazine and isosorbide.  Systolic blood pressure 825 to 170 mmHg.    Paroxysmal A-fib ([name]) Continue rate control with diltiazem, patient has been considered high bleeding risk, currently off anticoagulation.  Ok to discontinue telemetry.   (HFpEF) heart failure with preserved ejection fraction Michael [name]) Patient with no clinical sings of decompensated heart failure. Continue blood pressure monitoring.  At home patient not on diuretic therapy.   HIV (human immunodeficiency virus infection) ([place]) Continue with antiretroviral therapy.  Follow up as outpatient.    History of pulmonary embolism Currently patient is off anticoagulation.   Type 2 diabetes mellitus with hyperlipidemia ([name]) His glucose has been well controlled, fasting is 101, with capillary 159, 156, 99.  Plan to discontinue insulin therapy and continue capillary glucose monitoring as needed.   Continue with statin therapy.   Polysubstance abuse ([place]) Cocaine use, no acute intoxication Will need outpatient follow up.   Patient with persistent back pain that is limiting  his mobility, has been persistent despite acetaminophen and lidocaine patch. Not able to use non steroidal anti inflammatory agents. Will increase gabapentin to 300 mg tid.   Suicidal ideations Patient has been evaluated by psychiatry, plan to  continue with fluoxetine.  Change trazodone to schedule to improve sleep.   COPD (chronic obstructive pulmonary disease) ([place]) Patient with no clinical signs of exacerbation, continue with bronchodilator therapy.         Subjective: Patient is having back pain radiated to the left leg, not able to sleep last night,  poor appetite and glucose has been low.   Physical Exam: Vitals:   09/19/22 0604 09/19/22 0729 09/19/22 0731 09/19/22 0732  BP: (!) 178/100     Pulse: 88     Resp: 18     Temp: 99.3 F (37.4 C)     TempSrc: Oral     SpO2: 95% 99% 96% 98%  Weight:      Height:       Neurology awake and alert ENT with mild pallor Cardiovascular with S1 and S2 present and rhythmic Respiratory with no rales or wheezing Abdomen with no distention No lower extremity edema  Data Reviewed:    Family Communication: no family at the bedside   Disposition: Status is: Inpatient Remains inpatient appropriate because: pending transfer to SNF   Planned Discharge Destination: Home with Home Health      Author: Tawni Millers, MD 09/19/2022 9:42 AM  For on call review [email or web address].

## 2022-09-19 NOTE — TOC Progression Note (Addendum)
Transition of Care [facility]) - Initial/Assessment Note    Patient Details  Name: Michael [name] MRN: [id number] Date of Birth: 1951-02-15  Transition [facility]) CM/SW Contact:    Milinda Antis, [place] Phone Number: 09/19/2022, 12:29 PM  Clinical Narrative:                 Patient has not received any bed offers as of yet.  TOC will continue to follow.    Expected Discharge Plan: Skilled Nursing Facility Barriers to Discharge: Continued Medical Work up, SNF Pending bed offer   Patient Goals and CMS Choice Patient states their goals for this hospitalization and ongoing recovery are:: strengthen myself      Expected Discharge Plan and Services Expected Discharge Plan: [place] In-house Referral: Clinical Social Work   Post Acute Care Choice: [place] Living arrangements for the past 2 months: Hotel/Motel                                      Prior Living Arrangements/Services Living arrangements for the past 2 months: Hotel/Motel Lives with:: Self Patient language and need for interpreter reviewed:: Yes Do you feel safe going back to the place where you live?: Yes      Need for Family Participation in Patient Care: No (Comment) Care giver support system in place?: Yes (comment) Current home services: Other (comment) (none) Criminal Activity/Legal Involvement Pertinent to Current Situation/Hospitalization: No - Comment as needed  Activities of Daily Living Home Assistive Devices/Equipment: Cane (specify quad or straight) ADL Screening (condition at time of admission) Patient's cognitive ability adequate to safely complete daily activities?: Yes Is the patient deaf or have difficulty hearing?: No Does the patient have difficulty seeing, even when wearing glasses/contacts?: Yes Does the patient have difficulty concentrating, remembering, or making decisions?: Yes Patient able to express need for assistance with ADLs?: Yes Does  the patient have difficulty dressing or bathing?: Yes Independently performs ADLs?: Yes (appropriate for developmental age) Does the patient have difficulty walking or climbing stairs?: Yes Weakness of Legs: Both Weakness of Arms/Hands: Both  Permission Sought/Granted Permission sought to share information with : Family Supports Permission granted to share information with : Yes, Verbal Permission Granted  Share Information with NAME: sister [name], son Lamonta  Permission granted to share info w AGENCY: SNF        Emotional Assessment Appearance:: Appears stated age Attitude/Demeanor/Rapport: Engaged Affect (typically observed): Appropriate, Pleasant Orientation: : Oriented to Self, Oriented to Place, Oriented to  Time, Oriented to Situation Alcohol / Substance Use: Illicit Drugs Psych Involvement: Yes (comment)  Admission diagnosis:  ARF (acute renal failure) ([place]) [N17.9] Polysubstance abuse ([place]) [F19.10] AKI (acute kidney injury) ([place]) [N17.9] Patient Active Problem List   Diagnosis Date Noted   Polysubstance abuse ([place]) 09/13/2022   ARF (acute renal failure) ([place]) 09/09/2022   (HFpEF) heart failure with preserved ejection fraction ([place]) 09/09/2022   Elevated troponin 09/09/2022   Impaired functional mobility, balance, and endurance 11/29/2021   Transfusion history 11/29/2021   Unstable balance 11/29/2021   Dilated cardiomyopathy ([place]) 11/29/2021   Arthritis 11/29/2021   Diabetic foot infection ([place]) 06/20/2021   Presence of aortocoronary bypass graft 04/16/2021   Iron deficiency anemia 04/04/2021   Atypical chest pain 04/03/2021   Anemia of chronic disease 12/14/2020   Type 2 diabetes mellitus with hyperlipidemia ([place]) 08/14/2020   Idiopathic chronic gout of multiple sites  without tophus 02/17/2020   Diabetic ulcer of toe of right foot associated with type 2 diabetes mellitus ([place]) 12/17/2019   Vitamin B12 deficiency 12/17/2019   Homelessness 12/13/2019   Cellulitis  12/07/2019   Osteomyelitis ([place]) 07/17/2019   Abscess or cellulitis of toe, right    Toe pain, right-second    MDD (major depressive disorder), recurrent episode, severe ([place]) 06/15/2019   Respiratory distress 04/12/2019   Pneumonia due to severe acute respiratory syndrome coronavirus 2 (SARS-CoV-2) 04/11/2019   COVID-19 virus infection 03/19/2019   Chest pain 10/03/2018   Malnutrition of moderate degree 09/21/2018   MDD (major depressive disorder), recurrent episode, moderate ([name])    Type II diabetes mellitus with renal manifestations ([place]) 05/04/2018   GERD (gastroesophageal reflux disease) 05/04/2018   S/P CABG (coronary artery bypass graft)    Left testicular pain    Depression 02/20/2018   Epididymo-orchitis, acute 02/20/2018   Essential hypertension 02/20/2018   Precordial chest pain 02/20/2018   Acute kidney injury superimposed on chronic kidney disease ([place]) 02/14/2018   HCAP (healthcare-associated pneumonia) 02/14/2018   History of pulmonary embolism 07/04/2017   Acute hyponatremia 05/11/2017   Hyperglycemia due to type 2 diabetes mellitus ([place]) 05/11/2017   CHF (congestive heart failure) ([place]) 04/01/2017   Hepatitis C 12/30/2016   Substance induced mood disorder ([place]) 11/23/2016   Chronic ischemic heart disease 11/12/2016   Paroxysmal A-fib ([place]) 11/12/2016   Back pain 04/18/2016   S/P carotid endarterectomy 11/15/2015   COPD (chronic obstructive pulmonary disease) ([place]) 10/22/2015   Stenosis of cervical spine with myelopathy ([place]) 10/20/2015   MDD (major depressive disorder), recurrent severe, without psychosis ([place]) 09/09/2015   History of fusion of cervical spine 08/28/2015   Cocaine-induced mood disorder ([place]) 08/14/2015   Cocaine abuse with cocaine-induced mood disorder ([place]) 08/14/2015   Gout 07/10/2015   Acute renal failure superimposed on stage 3 chronic kidney disease ([place]) 03/06/2015   Anemia 03/06/2015   Chronic kidney disease, stage III (moderate) ([place])  03/06/2015   Hypoglycemia    Encounter for general adult medical examination with abnormal findings 02/09/2015   Cocaine use disorder, moderate, dependence ([place]) 12/13/2014   Substance or medication-induced depressive disorder with onset during withdrawal ([place]) 12/13/2014   Severe recurrent major depressive disorder with psychotic features ([place]) 12/12/2014   Major depressive disorder, recurrent, severe without psychotic features ([place])    Suicidal ideations 08/15/2014   Cervicalgia 06/28/2014   Lumbar radiculopathy, chronic 06/28/2014   Asthma, chronic 02/03/2014   S/P percutaneous transluminal coronary angioplasty 10/15/2013   3-vessel CAD 06/24/2013   ED (erectile dysfunction) of organic origin 07/07/2012   Cocaine abuse, continuous ([place]) 05/13/2012   Hypertension goal BP (blood pressure) < 140/80 04/29/2012   Chondromalacia of left knee 03/19/2012   Hyperlipidemia with target LDL less than 100 02/12/2012   Fibromyalgia 02/12/2012   Cocaine use disorder ([place]) 01/10/2012    Class: Acute   Human immunodeficiency virus (HIV) disease ([place]) 12/16/2011   HIV (human immunodeficiency virus infection) ([place]) 08/27/2011   Uncontrolled type 2 diabetes with neuropathy 10/17/2000   PCP:  [name], MD Pharmacy:   [name] [address] [place] [address] Phone: [phone] Fax: [place] [address] [place] [address] Phone: [phone] Fax: [place], [place] [address] [place] [address] Phone: [phone] Fax: [phone]     Social Determinants  of Health (SDOH) Interventions    Readmission Risk Interventions    07/05/2022   10:08 AM  Readmission Risk Prevention Plan  Transportation Screening Complete  Medication Review ([place]) Complete  PCP or Specialist appointment within 3-5 days of  discharge Complete  HRI or [place] Complete  SW Recovery Care/Counseling Consult Complete  [place] Not Applicable

## 2022-09-20 DIAGNOSIS — I480 Paroxysmal atrial fibrillation: Secondary | ICD-10-CM | POA: Diagnosis not present

## 2022-09-20 DIAGNOSIS — N179 Acute kidney failure, unspecified: Secondary | ICD-10-CM | POA: Diagnosis not present

## 2022-09-20 DIAGNOSIS — I5032 Chronic diastolic (congestive) heart failure: Secondary | ICD-10-CM | POA: Diagnosis not present

## 2022-09-20 DIAGNOSIS — I10 Essential (primary) hypertension: Secondary | ICD-10-CM | POA: Diagnosis not present

## 2022-09-20 LAB — GLUCOSE, CAPILLARY
Glucose-Capillary: 90 mg/dL (ref 70–99)
Glucose-Capillary: 122 mg/dL — ABNORMAL HIGH (ref 70–99)

## 2022-09-20 MED ORDER — GABAPENTIN 300 MG PO CAPS
300.0000 mg | ORAL_CAPSULE | Freq: Two times a day (BID) | ORAL | Status: DC
  Administered 2022-09-20 – 2022-10-18 (×57): 300 mg via ORAL
  Filled 2022-09-20 (×57): qty 1

## 2022-09-20 NOTE — Progress Notes (Signed)
Physical Therapy Treatment Patient Details Name: Michael [name] MRN: [id number] DOB: 1951/06/22 Today's Date: 09/20/2022   History of Present Illness 71 y.o. male admitted 09/09/22 with chest pain, AKI on CKD stage IIIb, Suicidal ideations in the setting of depression. Recently hospitalized 11/30-12/1/23. PMHx:HTN, chronic HFrEF with recovered LVEF 60 to 65%, PAF on Eliquis, CAD s/p CABG and stenting, DM, CKD stage IIIb, HIV on HAART, and cocaine abuse.    PT Comments    Great effort by patient working with physical therapy today. Extensive participation in exercises to enhance strength, endurance, and stability with carry over to functional daily activities. Further improvement in transfer and gait training. Distance still limited with gait using RW, however showing good RW control and stability with this device. Patient will continue to benefit from skilled physical therapy services to further improve independence with functional mobility.    Recommendations for follow up therapy are one component of a multi-disciplinary discharge planning process, led by the attending physician.  Recommendations may be updated based on patient status, additional functional criteria and insurance authorization.  Follow Up Recommendations  Skilled nursing-short term rehab (<3 hours/day) Can patient physically be transported by private vehicle: Yes   Assistance Recommended at Discharge Set up Supervision/Assistance  Patient can return home with the following A little help with walking and/or transfers;A little help with bathing/dressing/bathroom;Direct supervision/assist for medications management;Direct supervision/assist for financial management;Assist for transportation;Assistance with cooking/housework   Equipment Recommendations  Rolling walker (2 wheels);BSC/3in1    Recommendations for Other Services Other (comment) (Recommend a closer look at pt's vestibular function)     Precautions /  Restrictions Precautions Precautions: Fall Restrictions Weight Bearing Restrictions: No     Mobility  Bed Mobility Overal bed mobility: Modified Independent             General bed mobility comments: extra time    Transfers Overall transfer level: Needs assistance Equipment used: Rolling walker (2 wheels) Transfers: Sit to/from Stand Sit to Stand: Min assist           General transfer comment: Min assist for balance to rise from low bed setting. Still showing posterior instability. Practiced several times with cues for hand placement, foot placement and anterior weight shift which improved progression to CGA.    Ambulation/Gait Ambulation/Gait assistance: Min guard Gait Distance (Feet): 85 Feet Assistive device: Rolling walker (2 wheels) Gait Pattern/deviations: Step-through pattern, Decreased stride length, Shuffle, Narrow base of support Gait velocity: slow Gait velocity interpretation: <1.31 ft/sec, indicative of household ambulator   General Gait Details:  (Tolerated gait training well, further cues for larger step length, wider BOS, and increased pace. Pt with 2/4 dyspnea however reports feeling more anxious than fatigued or SOB. No episodeso f LOB.)   Stairs             Wheelchair Mobility    Modified Rankin (Stroke Patients Only)       Balance Overall balance assessment: Needs assistance Sitting-balance support: No upper extremity supported, Feet supported Sitting balance-Leahy Scale: Good     Standing balance support: No upper extremity supported, During functional activity Standing balance-Leahy Scale: Fair Standing balance comment: close min guard for balance                            Cognition Arousal/Alertness: Awake/alert Behavior During Therapy: WFL for tasks assessed/performed Overall Cognitive Status: Within Functional Limits for tasks assessed  Exercises  General Exercises - Lower Extremity Ankle Circles/Pumps: AROM, Both, 15 reps, Supine Quad Sets: Strengthening, Both, 15 reps, Supine Gluteal Sets: Strengthening, Both, 15 reps, Supine Short Arc Quad: Strengthening, Both, 15 reps, Supine Long Arc Quad: Strengthening, Both, Seated, 15 reps Heel Slides: AROM, Both, 15 reps, Supine Hip ABduction/ADduction: Strengthening, Both, 15 reps, Supine Straight Leg Raises: Strengthening, Both, 15 reps, Supine Hip Flexion/Marching: Strengthening, Both, 15 reps, Standing Toe Raises: Strengthening, Both, 10 reps, Standing Heel Raises: Strengthening, Both, 10 reps, Standing Mini-Sqauts: Strengthening, Both, 10 reps, Standing Other Exercises Other Exercises: Bridge x 10 supine Other Exercises: Romberg stance 30" x2 Other Exercises: Standing EC 30" x2 Other Exercises: Standing reach multi planes x30" Other Exercises: Seated rows + scap retraction with gait belt anchored to foot rest. x10    General Comments        Pertinent Vitals/Pain Pain Assessment Pain Assessment: No/denies pain Pain Intervention(s): Monitored during session    Home Living                          Prior Function            PT Goals (current goals can now be found in the care plan section) Acute Rehab PT Goals Patient Stated Goal: Get some physcial therapy, then go to substance abuse rehab. PT Goal Formulation: With patient Time For Goal Achievement: 09/27/22 Potential to Achieve Goals: Good Progress towards PT goals: Progressing toward goals    Frequency    Min 3X/week      PT Plan Current plan remains appropriate    Co-evaluation              AM-PAC PT "6 Clicks" Mobility   Outcome Measure  Help needed turning from your back to your side while in a flat bed without using bedrails?: None Help needed moving from lying on your back to sitting on the side of a flat bed without using bedrails?: A Little Help needed moving to and from a bed to  a chair (including a wheelchair)?: A Little Help needed standing up from a chair using your arms (e.g., wheelchair or bedside chair)?: A Little Help needed to walk in hospital room?: A Little Help needed climbing 3-5 steps with a railing? : A Lot 6 Click Score: 18    End of Session Equipment Utilized During Treatment: Gait belt Activity Tolerance: Patient tolerated treatment well Patient left: in chair;with call bell/phone within reach;with chair alarm set Nurse Communication: Mobility status PT Visit Diagnosis: Unsteadiness on feet (R26.81);Other abnormalities of gait and mobility (R26.89);History of falling (Z91.81);Muscle weakness (generalized) (M62.81);Difficulty in walking, not elsewhere classified (R26.2)     Time: 1025-8527 PT Time Calculation (min) (ACUTE ONLY): 34 min  Charges:  $Gait Training: 8-22 mins $Therapeutic Exercise: 8-22 mins                     [name], PT, DPT Physical Therapist Acute Rehabilitation Services [place]    Ellouise Newer 09/20/2022, 1:07 PM

## 2022-09-20 NOTE — TOC Progression Note (Signed)
Transition of Care [facility]) - Progression Note    Patient Details  Name: Michael [name] MRN: [id number] Date of Birth: 09-24-1951  Transition of Care [facility]) CM/SW Contact  Tom-Johnson, Renea Ee, RN Phone Number: 09/20/2022, 9:57 AM  Clinical Narrative:     CM messaged Precious Haws, OTA via secure chat about patient enrolling in the [place] program as no bed offers from SNF has been received at this time. Corinne Ports notified CM today that patient's name has been added to the list. When patient is able to progress with this program, he agreed to be discharged to his motel. TOC will continue to follow as patient progresses with care towards discharge.     Expected Discharge Plan: [place] Barriers to Discharge: Continued Medical Work up, SNF Pending bed offer  Expected Discharge Plan and Services Expected Discharge Plan: [place] In-house Referral: Clinical Social Work   Post Acute Care Choice: [place] Living arrangements for the past 2 months: Hotel/Motel                                       Social Determinants of Health (SDOH) Interventions    Readmission Risk Interventions    07/05/2022   10:08 AM  Readmission Risk Prevention Plan  Transportation Screening Complete  Medication Review ([place]) Complete  PCP or Specialist appointment within 3-5 days of discharge Complete  HRI or [place] Complete  SW Recovery Care/Counseling Consult Complete  [place] Not Applicable

## 2022-09-20 NOTE — Progress Notes (Signed)
Progress Note   Patient: Delmos Velaquez [id number]:[id number] DOB: 1951-08-02 DOA: 09/09/2022     10 DOS: the patient was seen and examined on 09/20/2022   Brief hospital course: Mr. Bogacki was admitted to the hospital with the working diagnosis of acute renal failure on chronic renal disease.   71 yo male with the past medical history of hypertension, heart failure, paroxysmal atrial fibrillation, coronary artery disease, HIV, T2DM, cocaine use and CKD stage 3b who presented with chest pain. Recent hospitalization for chest pain 11/30 to 09/06/22, with was placed on medical therapy including aspirin, clopidogrel, atorvastatin and isosorbide. At home he had recurrent chest pain after using crack cocaine. He had left sided chest pain, associated with nausea. Patient homeless and having thoughts of ending his life (no specific plan). On his initial physical examination his blood pressure was 179/103, HR 87, RR 20 and 02 saturation 100%, lungs with no wheezing or rales, heart with S1 and S2 present and rhythmic, abdomen with no distention, positive right costovertebral tenderness, no lower extremity edema.   Na 135, K 5,1 CL 107 bicarbonate 15, glucose 123, bun 81, cr 6,32  Wbc 7,8 hgb 9,6 plt 155  Toxicology positive for cocaine.  Urine analysis SG 1,013, 6-10 rbc, 100 protein, negative leukocytes.   Chest radiograph with no cardiomegaly, no infiltrates, sternotomy wires and left atrial clip.   EKG 85 bpm, normal axis, normal intervals, sinus rhythm with poor R R wave progression, no significant ST segment or T wave changes.   Patient was placed on IV fluids with improvement in renal function.  US renal with no obstructive uropathy.   Psychiatry was consulted with recommendations to continue medical therapy. No indication for inpatient psych admission.   PT and OT have recommended patient to be transfer to SNF to contin with physical therapy.   12/14 Patient pending to be transfer to SNF.   12/15 progressive care meeting, patient not able to transfer to SNF, will plan to discharge home when physically more capable.   Assessment and Plan: Acute kidney injury superimposed on chronic kidney disease ([name]) Stage 4 CKD.   Improving po intake. Follow up renal function tomorrow.   Anemia of chronic renal disease, follow up hgb is 8,0 Iron panel with serum iron 62, TIBC 274, Transferrin saturation 23 and ferritin 464, consistent with anemia of chronic renal diease.   Essential hypertension Continue blood pressure control with diltiazem, hydralazine and isosorbide.  Systolic blood pressure 076 to 150 mmHg.    Paroxysmal A-fib ([name]) Continue rate control with diltiazem, patient has been considered high bleeding risk, currently off anticoagulation.  Off telemetry.   (HFpEF) heart failure with preserved ejection fraction [facility]) Patient with no clinical sings of decompensated heart failure. Continue blood pressure monitoring.  At home patient not on diuretic therapy.   HIV (human immunodeficiency virus infection) ([place]) Continue with antiretroviral therapy.  Follow up as outpatient.    History of pulmonary embolism Currently patient is off anticoagulation.   Type 2 diabetes mellitus with hyperlipidemia ([name]) Glucose has been controlled. Off insulin therapy.  Continue with statin therapy.   Polysubstance abuse ([place]) Cocaine use, no acute intoxication Will need outpatient follow up.   Back pain has improved, continue with bid gabapentin.    Suicidal ideations Patient has been evaluated by psychiatry, plan to continue with fluoxetine.  Change trazodone to schedule to improve sleep.  No further suicidal precautions needed.   COPD (chronic obstructive pulmonary disease) ([place]) Patient with no clinical signs  of exacerbation, continue with bronchodilator therapy.         Subjective: Patient is out of bed to the chair with the help of physical therapy, his back pain  has improved.   Physical Exam: Vitals:   09/19/22 1934 09/19/22 2054 09/20/22 0549 09/20/22 0904  BP:  (!) 149/86 (!) 158/88 (!) 147/74  Pulse: 73 79 72 86  Resp: '14  16 18  '$ Temp:  98.9 F (37.2 C) 98.8 F (37.1 C) 98.5 F (36.9 C)  TempSrc:  Oral Oral Oral  SpO2: 96% 100% 99% 95%  Weight:      Height:       Neurology awake and alert ENT with no pallor Cardiovascular with S1 and S2 present and rhythmic Respiratory with no wheezing Abdomen with no distention  No lower extremity edema  Data Reviewed:    Family Communication: no family at the bedside   Disposition: Status is: Inpatient Remains inpatient appropriate because: pending placement   Planned Discharge Destination: Home    Author: Tawni Millers, MD 09/20/2022 1:30 PM  For on call review [email or web address].

## 2022-09-21 DIAGNOSIS — I480 Paroxysmal atrial fibrillation: Secondary | ICD-10-CM | POA: Diagnosis not present

## 2022-09-21 DIAGNOSIS — I5032 Chronic diastolic (congestive) heart failure: Secondary | ICD-10-CM | POA: Diagnosis not present

## 2022-09-21 DIAGNOSIS — N179 Acute kidney failure, unspecified: Secondary | ICD-10-CM | POA: Diagnosis not present

## 2022-09-21 DIAGNOSIS — I10 Essential (primary) hypertension: Secondary | ICD-10-CM | POA: Diagnosis not present

## 2022-09-21 LAB — BASIC METABOLIC PANEL
Anion gap: 8 (ref 5–15)
BUN: 29 mg/dL — ABNORMAL HIGH (ref 8–23)
CO2: 18 mmol/L — ABNORMAL LOW (ref 22–32)
Calcium: 8.4 mg/dL — ABNORMAL LOW (ref 8.9–10.3)
Chloride: 115 mmol/L — ABNORMAL HIGH (ref 98–111)
Creatinine, Ser: 2.92 mg/dL — ABNORMAL HIGH (ref 0.61–1.24)
GFR, Estimated: 22 mL/min — ABNORMAL LOW (ref >60)
Glucose, Bld: 140 mg/dL — ABNORMAL HIGH (ref 70–99)
Potassium: 4.5 mmol/L (ref 3.5–5.1)
Sodium: 141 mmol/L (ref 135–145)

## 2022-09-21 LAB — GLUCOSE, CAPILLARY
Glucose-Capillary: 100 mg/dL — ABNORMAL HIGH (ref 70–99)
Glucose-Capillary: 243 mg/dL — ABNORMAL HIGH (ref 70–99)

## 2022-09-21 MED ORDER — BISACODYL 5 MG PO TBEC
5.0000 mg | DELAYED_RELEASE_TABLET | Freq: Every day | ORAL | Status: DC | PRN
  Administered 2022-09-21 – 2022-10-01 (×3): 5 mg via ORAL
  Filled 2022-09-21 (×3): qty 1

## 2022-09-21 MED ORDER — POLYETHYLENE GLYCOL 3350 17 G PO PACK
17.0000 g | PACK | Freq: Every day | ORAL | Status: DC
  Administered 2022-09-28 – 2022-09-30 (×2): 17 g via ORAL
  Filled 2022-09-21 (×10): qty 1

## 2022-09-21 NOTE — Progress Notes (Signed)
Progress Note   Patient: Michael [name] [id number] DOB: March 11, 1951 DOA: 09/09/2022     11 DOS: the patient was seen and examined on 09/21/2022   Brief hospital course: Michael [name] was admitted to the hospital with the working diagnosis of acute renal failure on chronic renal disease.   71 yo male with the past medical history of hypertension, heart failure, paroxysmal atrial fibrillation, coronary artery disease, HIV, T2DM, cocaine use and CKD stage 3b who presented with chest pain. Recent hospitalization for chest pain 11/30 to 09/06/22, with was placed on medical therapy including aspirin, clopidogrel, atorvastatin and isosorbide. At home he had recurrent chest pain after using crack cocaine. He had left sided chest pain, associated with nausea. Patient homeless and having thoughts of ending his life (no specific plan). On his initial physical examination his blood pressure was 179/103, HR 87, RR 20 and 02 saturation 100%, lungs with no wheezing or rales, heart with S1 and S2 present and rhythmic, abdomen with no distention, positive right costovertebral tenderness, no lower extremity edema.   Na 135, K 5,1 CL 107 bicarbonate 15, glucose 123, bun 81, cr 6,32  Wbc 7,8 hgb 9,6 plt 155  Toxicology positive for cocaine.  Urine analysis SG 1,013, 6-10 rbc, 100 protein, negative leukocytes.   Chest radiograph with no cardiomegaly, no infiltrates, sternotomy wires and left atrial clip.   EKG 85 bpm, normal axis, normal intervals, sinus rhythm with poor R R wave progression, no significant ST segment or T wave changes.   Patient was placed on IV fluids with improvement in renal function.  US renal with no obstructive uropathy.   Psychiatry was consulted with recommendations to continue medical therapy. No indication for inpatient psych admission.   PT and OT have recommended patient to be transfer to SNF to contin with physical therapy.   12/14 Patient pending to be transfer to SNF.   12/15 progressive care meeting, patient not able to transfer to SNF, will plan to discharge home when physically more capable.   Assessment and Plan: Acute kidney injury superimposed on chronic kidney disease ([name]) Stage 4 CKD.   Improving po intake. Serum cr is 2,92 with K at 4,5 and serum bicarbonate at 18,  Na 141 and Cl 115.  Continue to encourage po intake, follow up renal function in 48 hrs.   Anemia of chronic renal disease, follow up hgb is 8,0 Iron panel with serum iron 62, TIBC 274, Transferrin saturation 23 and ferritin 464, consistent with anemia of chronic renal diease.   Metabolic bone disease continue with sevelamer.   Essential hypertension Continue blood pressure control with diltiazem, hydralazine and isosorbide.  Blood pressure has been well controlled.    Paroxysmal A-fib ([name]) Continue rate control with diltiazem, patient has been considered high bleeding risk, currently off anticoagulation.  Off telemetry.   (HFpEF) heart failure with preserved ejection fraction [facility]) Patient with no clinical sings of decompensated heart failure. Continue blood pressure monitoring.  At home patient not on diuretic therapy.   HIV (human immunodeficiency virus infection) ([place]) Continue with antiretroviral therapy.  Follow up as outpatient.    History of pulmonary embolism Currently patient is off anticoagulation.   Type 2 diabetes mellitus with hyperlipidemia ([name]) Glucose has been controlled, fasting glucose today is 140 Off insulin therapy.  Continue with statin therapy.   Polysubstance abuse ([place]) Cocaine use, no acute intoxication Will need outpatient follow up.   Back pain has improved, continue with bid gabapentin.    Suicidal  ideations Patient has been evaluated by psychiatry, plan to continue with fluoxetine.  Change trazodone to schedule to improve sleep.  No further suicidal precautions needed.   COPD (chronic obstructive pulmonary disease)  ([place]) Patient with no clinical signs of exacerbation, continue with bronchodilator therapy.         Subjective: Patient with constipation today, no chest pain or dyspnea   Physical Exam: Vitals:   09/20/22 1625 09/20/22 2020 09/21/22 0535 09/21/22 0945  BP: (!) 147/83 (!) 162/90 (!) 154/70 135/66  Pulse: 72 69 76 98  Resp: '17 18 18 18  '$ Temp: (!) 97.3 F (36.3 C) 97.7 F (36.5 C) 98.8 F (37.1 C) 98.1 F (36.7 C)  TempSrc: Oral Oral Oral Oral  SpO2:  99% 98% 98%  Weight:      Height:       Neurology awake and alert ENT with no pallor Cardiovascular with S1 and S2 present and rhythmic with no gallops Respiratory with no rales or wheezing Abdomen with no distention  No lower extremity edema  Data Reviewed:    Family Communication: no family at the bedside   Disposition: Status is: Inpatient Remains inpatient appropriate because: pending disposition   Planned Discharge Destination: Home      Author: Tawni Millers, MD 09/21/2022 11:21 AM  For on call review [email or web address].

## 2022-09-21 NOTE — Progress Notes (Signed)
Mobility Specialist Progress Note:   09/21/22 0937  Mobility  Activity Ambulated with assistance in hallway  Level of Assistance Contact guard assist, steadying assist  Assistive Device Front wheel walker  Distance Ambulated (ft) 100 ft  Activity Response Tolerated well  Mobility Referral Yes  $Mobility charge 1 Mobility   Pt received in bed and agreeable. C/o mild dizziness and fatigue throughout ambulation. Took 1x brief standing break d/t fatigue. Pt left in bed with all needs met and call bell in reach.   Andrey Campanile Mobility Specialist Please contact via SecureChat or  Rehab office at [phone]

## 2022-09-22 DIAGNOSIS — I480 Paroxysmal atrial fibrillation: Secondary | ICD-10-CM | POA: Diagnosis not present

## 2022-09-22 DIAGNOSIS — N179 Acute kidney failure, unspecified: Secondary | ICD-10-CM | POA: Diagnosis not present

## 2022-09-22 DIAGNOSIS — I10 Essential (primary) hypertension: Secondary | ICD-10-CM | POA: Diagnosis not present

## 2022-09-22 DIAGNOSIS — I5032 Chronic diastolic (congestive) heart failure: Secondary | ICD-10-CM | POA: Diagnosis not present

## 2022-09-22 LAB — GLUCOSE, CAPILLARY: Glucose-Capillary: 157 mg/dL — ABNORMAL HIGH (ref 70–99)

## 2022-09-22 MED ORDER — ORAL CARE MOUTH RINSE: 15.0000 mL | OROMUCOSAL | Status: DC | PRN

## 2022-09-22 NOTE — Progress Notes (Signed)
Mobility Specialist Progress Note:   09/22/22 0931  Mobility  Activity Ambulated with assistance in hallway  Level of Assistance Contact guard assist, steadying assist  Assistive Device Front wheel walker  Distance Ambulated (ft) 120 ft  Activity Response Tolerated well  Mobility Referral Yes  $Mobility charge 1 Mobility   Pt received in bed and agreeable. C/o mild dizziness, not worsened with ambulation. Pt left in chair with all needs met, call bell in reach, and chair alarm on.   Andrey Campanile Mobility Specialist Please contact via SecureChat or  Rehab office at [phone]

## 2022-09-22 NOTE — Progress Notes (Signed)
Progress Note   Patient: Michael [name] [id number] DOB: 1951/03/20 DOA: 09/09/2022     12 DOS: the patient was seen and examined on 09/22/2022   Brief hospital course: Michael [name] was admitted to the hospital with the working diagnosis of acute renal failure on chronic renal disease.   71 yo male with the past medical history of hypertension, heart failure, paroxysmal atrial fibrillation, coronary artery disease, HIV, T2DM, cocaine use and CKD stage 3b who presented with chest pain. Recent hospitalization for chest pain 11/30 to 09/06/22, with was placed on medical therapy including aspirin, clopidogrel, atorvastatin and isosorbide. At home he had recurrent chest pain after using crack cocaine. He had left sided chest pain, associated with nausea. Patient homeless and having thoughts of ending his life (no specific plan). On his initial physical examination his blood pressure was 179/103, HR 87, RR 20 and 02 saturation 100%, lungs with no wheezing or rales, heart with S1 and S2 present and rhythmic, abdomen with no distention, positive right costovertebral tenderness, no lower extremity edema.   Na 135, K 5,1 CL 107 bicarbonate 15, glucose 123, bun 81, cr 6,32  Wbc 7,8 hgb 9,6 plt 155  Toxicology positive for cocaine.  Urine analysis SG 1,013, 6-10 rbc, 100 protein, negative leukocytes.   Chest radiograph with no cardiomegaly, no infiltrates, sternotomy wires and left atrial clip.   EKG 85 bpm, normal axis, normal intervals, sinus rhythm with poor R R wave progression, no significant ST segment or T wave changes.   Patient was placed on IV fluids with improvement in renal function.  US renal with no obstructive uropathy.   Psychiatry was consulted with recommendations to continue medical therapy. No indication for inpatient psych admission.   PT and OT have recommended patient to be transfer to SNF to contin with physical therapy.   12/14 Patient pending to be transfer to SNF.   12/15 progressive care meeting, patient not able to transfer to SNF, will plan to discharge home when physically more capable.   Assessment and Plan: Acute kidney injury superimposed on chronic kidney disease ([name]) Stage 4 CKD.   Improving po intake. No edema or dyspnea.  Follow up renal function in am.  Avoid nephrotoxic medications or hypotension.  Anemia of chronic renal disease, follow up hgb is 8,0 Iron panel with serum iron 62, TIBC 274, Transferrin saturation 23 and ferritin 464, consistent with anemia of chronic renal diease.   Metabolic bone disease continue with sevelamer.   Essential hypertension Continue blood pressure control with diltiazem, hydralazine and isosorbide.  Blood pressure has been well controlled.    Paroxysmal A-fib ([name]) Continue rate control with diltiazem, patient has been considered high bleeding risk, currently off anticoagulation.  Off telemetry.   (HFpEF) heart failure with preserved ejection fraction [facility]) Patient with no clinical sings of decompensated heart failure. Continue blood pressure monitoring.  At home patient not on diuretic therapy.   HIV (human immunodeficiency virus infection) ([place]) Continue with antiretroviral therapy.  Follow up as outpatient.    History of pulmonary embolism Currently patient is off anticoagulation.   Type 2 diabetes mellitus with hyperlipidemia ([name]) Glucose has been controlled,  Insulin has been discontinued. Check fasting insulin tomorrow.   Continue with statin therapy.   Polysubstance abuse ([place]) Cocaine use, no acute intoxication Will need outpatient follow up.   Back pain has improved, continue with bid gabapentin.    Suicidal ideations Patient has been evaluated by psychiatry, plan to continue with fluoxetine.  Change  trazodone to schedule to improve sleep.  No further suicidal precautions needed.   COPD (chronic obstructive pulmonary disease) ([place]) Patient with no clinical signs of  exacerbation, continue with bronchodilator therapy.         Subjective: Patient with no chest pain or dyspnea, positive bowel movement.   Physical Exam: Vitals:   09/21/22 2053 09/21/22 2102 09/22/22 0521 09/22/22 0913  BP:  (!) 147/80 138/74 129/68  Pulse: 75 73 71 79  Resp: '16 18 18 18  '$ Temp:  98.1 F (36.7 C) 98.2 F (36.8 C) 98.1 F (36.7 C)  TempSrc:  Oral Oral Oral  SpO2: 98% 98% 98% 98%  Weight:      Height:       Neurology awake and alert ENT with no pallor Cardiovascular with S1 and S2 present and rhythmic Respiratory with no rales or wheezing Abdomen with no distention No lower extremity edema  Data Reviewed:    Family Communication: no family at the bedside   Disposition: Status is: Inpatient Remains inpatient appropriate because: pending placement   Planned Discharge Destination: Home     Author: Tawni Millers, MD 09/22/2022 3:28 PM  For on call review [email or web address].

## 2022-09-23 ENCOUNTER — Inpatient Hospital Stay (HOSPITAL_COMMUNITY): Payer: Medicare Other

## 2022-09-23 DIAGNOSIS — I5032 Chronic diastolic (congestive) heart failure: Secondary | ICD-10-CM | POA: Diagnosis not present

## 2022-09-23 DIAGNOSIS — B20 Human immunodeficiency virus [HIV] disease: Secondary | ICD-10-CM | POA: Diagnosis not present

## 2022-09-23 DIAGNOSIS — N179 Acute kidney failure, unspecified: Secondary | ICD-10-CM | POA: Diagnosis not present

## 2022-09-23 DIAGNOSIS — N184 Chronic kidney disease, stage 4 (severe): Secondary | ICD-10-CM

## 2022-09-23 DIAGNOSIS — I10 Essential (primary) hypertension: Secondary | ICD-10-CM | POA: Diagnosis not present

## 2022-09-23 LAB — BASIC METABOLIC PANEL
Anion gap: 4 — ABNORMAL LOW (ref 5–15)
BUN: 31 mg/dL — ABNORMAL HIGH (ref 8–23)
CO2: 20 mmol/L — ABNORMAL LOW (ref 22–32)
Calcium: 8.3 mg/dL — ABNORMAL LOW (ref 8.9–10.3)
Chloride: 115 mmol/L — ABNORMAL HIGH (ref 98–111)
Creatinine, Ser: 3.17 mg/dL — ABNORMAL HIGH (ref 0.61–1.24)
GFR, Estimated: 20 mL/min — ABNORMAL LOW (ref >60)
Glucose, Bld: 112 mg/dL — ABNORMAL HIGH (ref 70–99)
Potassium: 4.5 mmol/L (ref 3.5–5.1)
Sodium: 139 mmol/L (ref 135–145)

## 2022-09-23 LAB — GLUCOSE, CAPILLARY
Glucose-Capillary: 92 mg/dL (ref 70–99)
Glucose-Capillary: 98 mg/dL (ref 70–99)
Glucose-Capillary: 162 mg/dL — ABNORMAL HIGH (ref 70–99)
Glucose-Capillary: 141 mg/dL — ABNORMAL HIGH (ref 70–99)

## 2022-09-23 IMAGING — CR DG CHEST 2V
2 series · 2 of 2 positions shown · non-contrast
Comparison: Chest x-ray 04/07/2021

CLINICAL DATA: Chest pain, shortness of breath

EXAM:
CHEST - 2 VIEW

[chest pa]
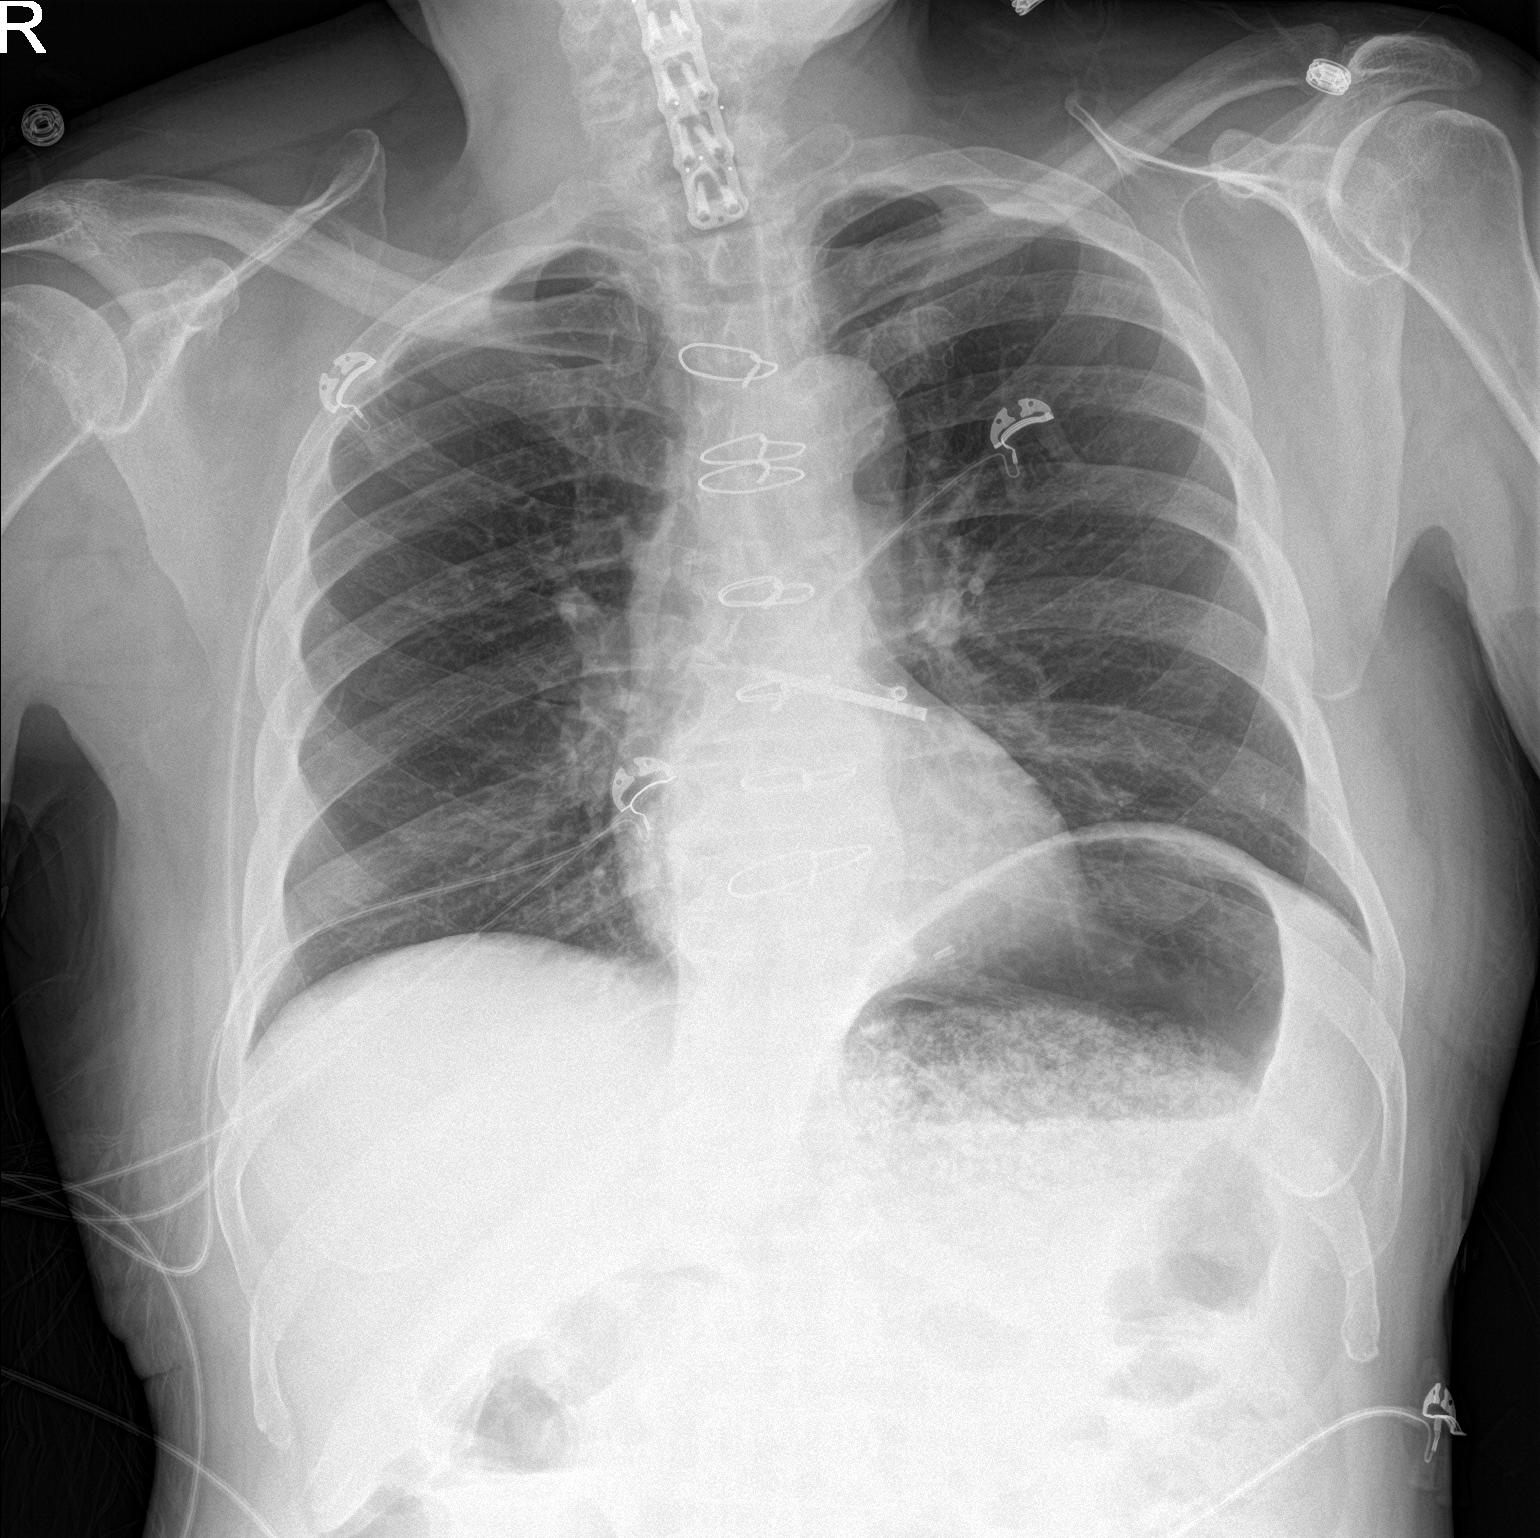

[chest lat]
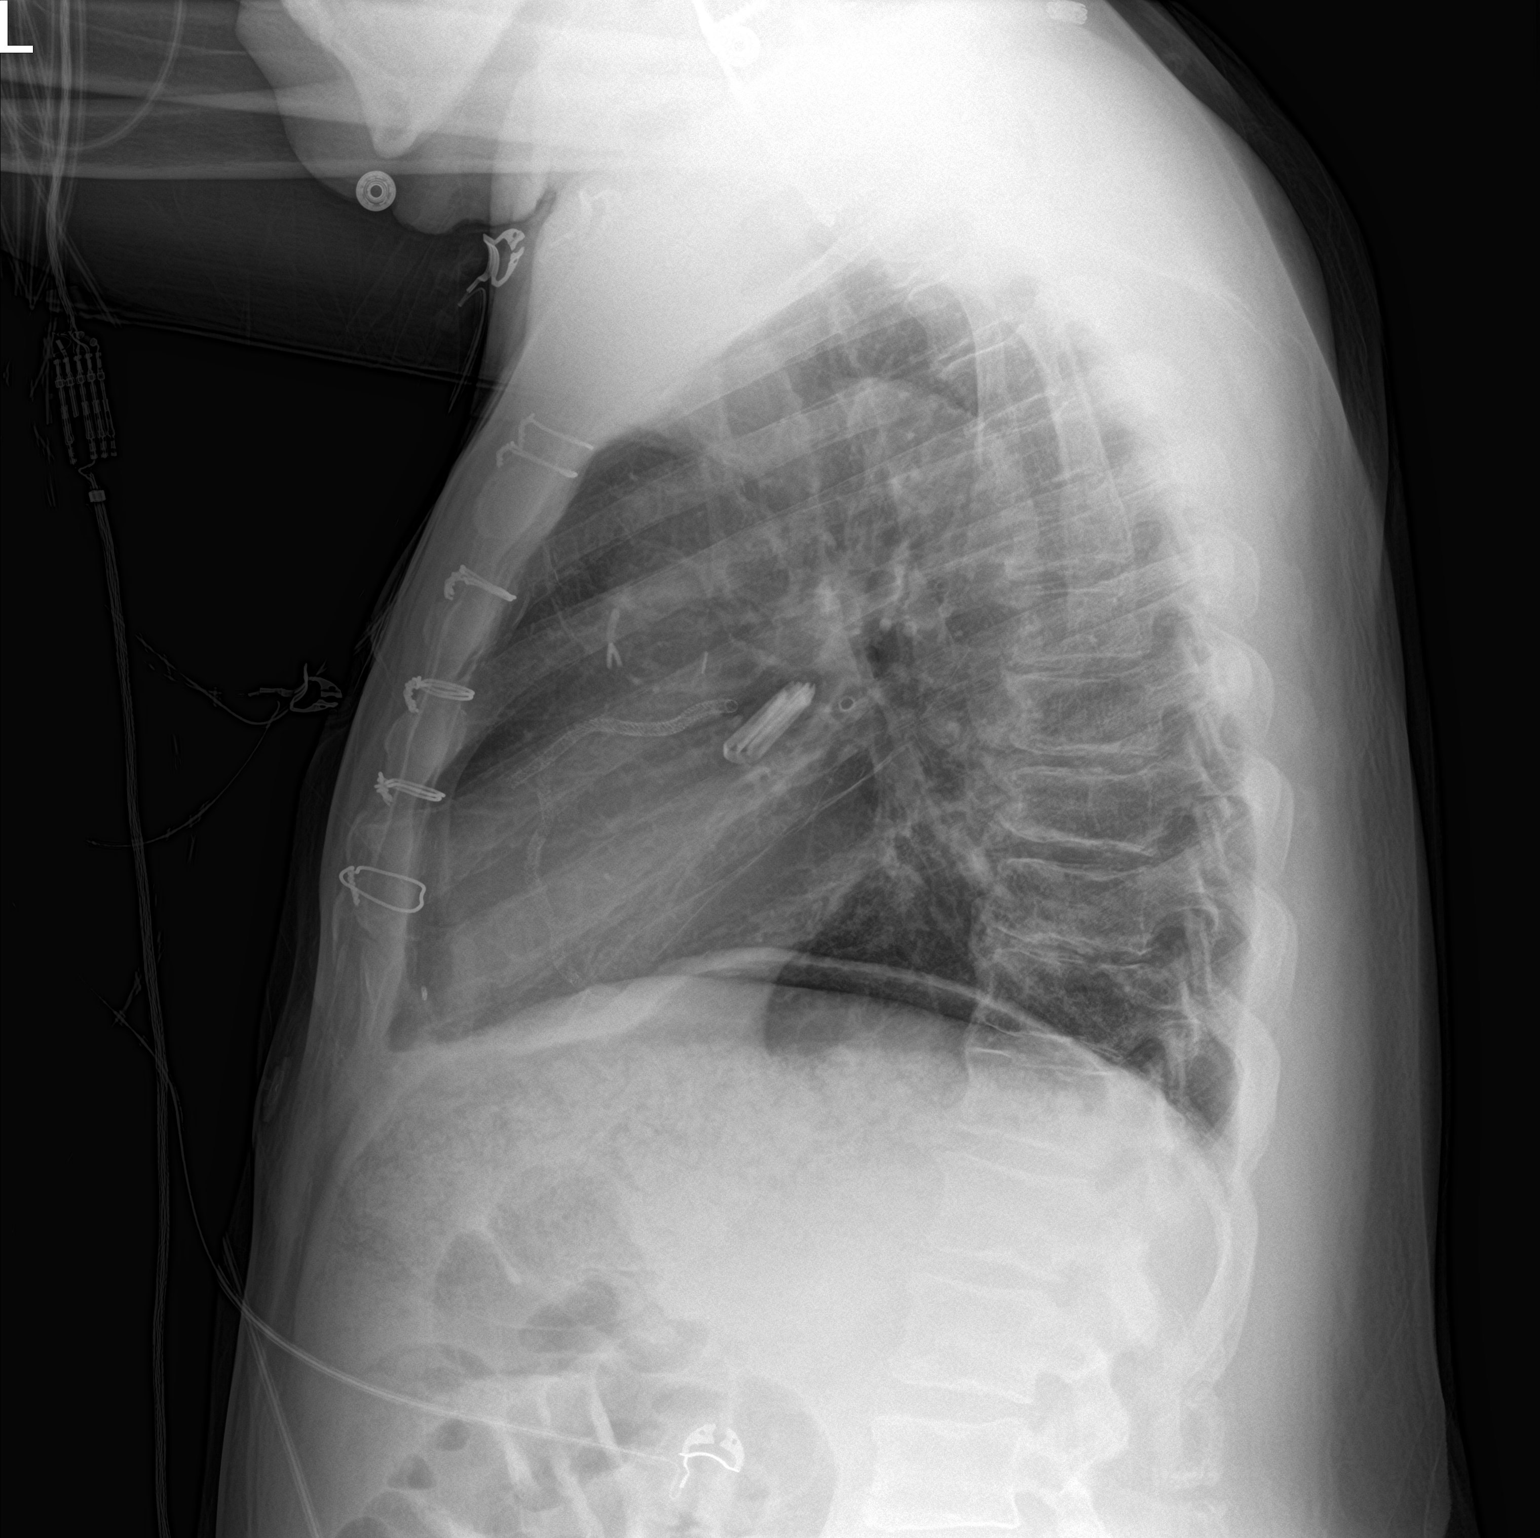

[2 of 2 positions shown; findings below may reference images not displayed]

FINDINGS: The heart and mediastinal contours are within normal limits. Atrial
appendage clip noted. Coronary artery stent. Aortic calcification.

Elevated left hemidiaphragm. No focal consolidation. No pulmonary
edema. No pleural effusion. No pneumothorax.

No acute osseous abnormality. Partially visualized cervicothoracic
surgical hardware.
IMPRESSION: 1. No active cardiopulmonary disease.
2.  Aortic Atherosclerosis ([id number]-238.8).

## 2022-09-23 IMAGING — CR DG FOOT COMPLETE 3+V*R*
3 series · 3 of 3 positions shown · non-contrast
Comparison: 12/07/2019 radiographs, MRI 04/27/2021

CLINICAL DATA: Toe pain

EXAM:
RIGHT FOOT COMPLETE - 3+ VIEW

[foot ap]
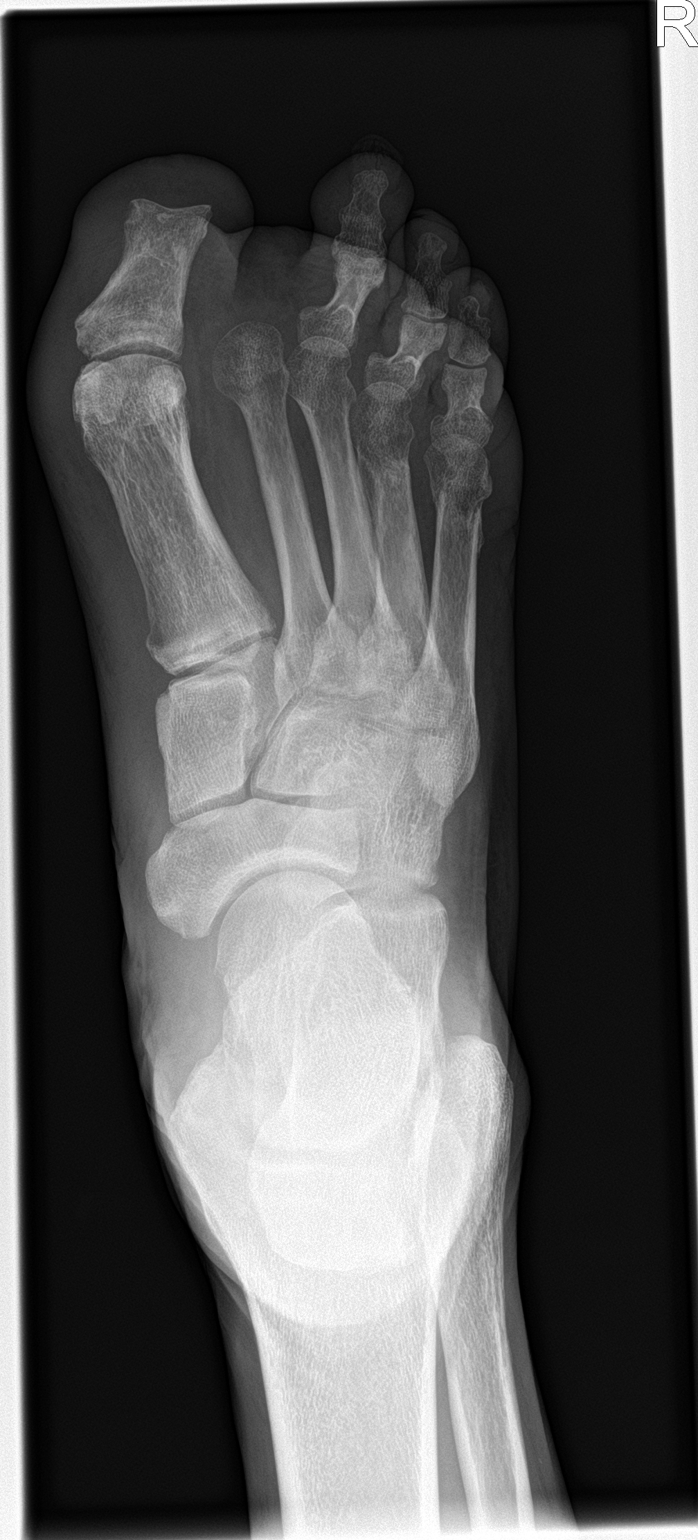

[foot obl]
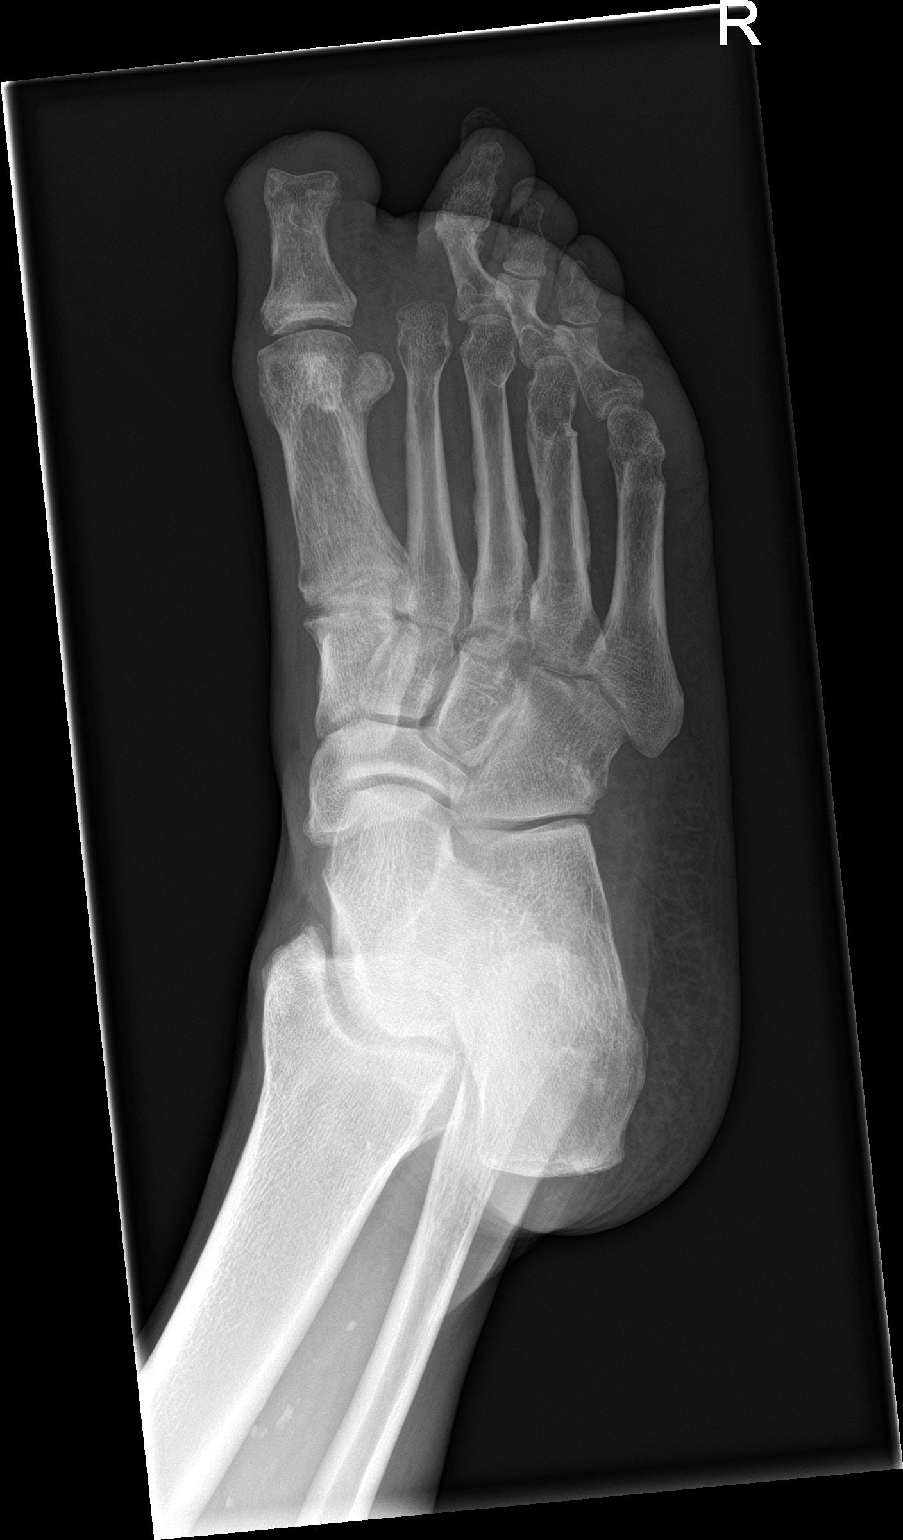

[foot lat]
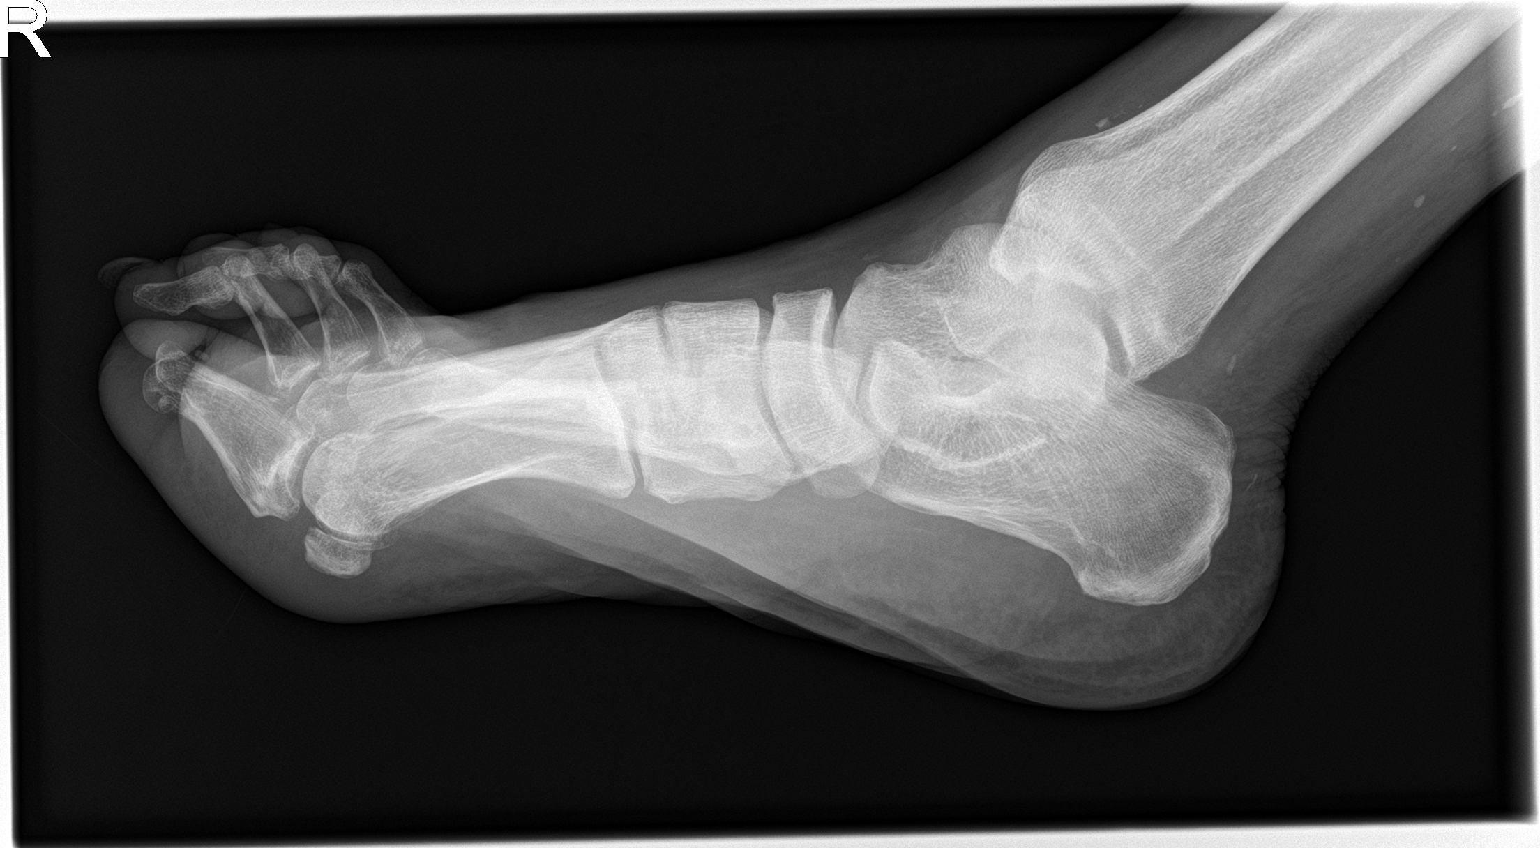

[3 of 3 positions shown; findings below may reference images not displayed]

FINDINGS: Status post amputation of first digit at the level of the IP joint.
Generalized soft tissue swelling. No osseous destructive change.
Previous second digit amputation at the level of the MTP joint. Old
fracture deformities of the fourth and fifth metatarsals.
IMPRESSION: 1. Status post amputation of first digit at the level of IP joint.
No definite acute osseous abnormality.
2. Previous amputation of the second digit at the level of the MTP
joint, also without acute osseous abnormality

## 2022-09-23 MED ORDER — SODIUM CHLORIDE 0.9 % IV BOLUS
1000.0000 mL | Freq: Once | INTRAVENOUS | Status: AC
  Administered 2022-09-23: 1000 mL via INTRAVENOUS

## 2022-09-23 NOTE — Progress Notes (Signed)
Mobility Specialist Progress Note   09/23/22 1200  Mobility  Activity Ambulated with assistance in hallway  Level of Assistance Contact guard assist, steadying assist  Assistive Device Front wheel walker  Distance Ambulated (ft) 115 ft  Range of Motion/Exercises Active;All extremities  Activity Response Tolerated well   Patient received in recliner and agreeable to participate. Required min A to boost into standing and ambulated min guard with slow gait. Patient with LOB x2 requiring light min for stability, otherwise is min guard for ambulation. Returned to room without complaint or incident. Was left in supine with all needs met, call bell in reach.   [place] Arles Rumbold, BS EXP Mobility Specialist Please contact via SecureChat or Rehab office at [phone]

## 2022-09-23 NOTE — Assessment & Plan Note (Signed)
Baseline serum creatinine appears to be around 2.2-2.4. Patient will need nephrology follow-up as an outpatient.

## 2022-09-23 NOTE — Progress Notes (Signed)
PROGRESS NOTE    Michael [name]  UXN:[id number] DOB: [birth year] DOA: 09/09/2022 PCP: [name], MD   Brief Narrative: Michael [name] is a 71 y.o. male with a history of hypertension, chronic heart failure, paroxysmal atrial fibrillation, CAD s/p CABG/PCI, CKD, HIV, cocaine abuse. [name]. Admission complicated by significant AKI that responded to IV fluids. Discharge complicated by inability to place patient at a SNF.   Assessment and Plan: Acute kidney injury superimposed on chronic kidney disease ([place]) Creatinine of 6.32 on admission. Associated BUN of 81. Patient started on IV fluids with improvement of renal function towards baseline. Now, creatinine is trending back up. -IV fluids overnight -Recheck metabolic panel in AM  Essential hypertension -Continue diltiazem, hydralazine, and Imdur  Paroxysmal A-fib ([name]) Patient is not on anticoagulation secondary to history of high bleeding risk -Continue diltiazem  (HFpEF) heart failure with preserved ejection fraction ([name]) Stable. Not on diuretic therapy.  HIV (human immunodeficiency virus infection) ([place]) -Continue Juluca  History of pulmonary embolism Noted. Not on anticoagulation.  Type 2 diabetes mellitus with hyperlipidemia ([name]) Hemoglobin A1C of 7.7%. Adequately controlled for age.  Polysubstance abuse ([place]) Noted. History of cocaine use.  Suicidal ideations Evaluated by psychiatry. No role for inpatient admission. -Continue trazodone and fluoxetine  COPD (chronic obstructive pulmonary disease) ([place]) Patient with some chest tightness today but no wheezing or dyspnea. -Continue Incruse Ellipta, Dulera and Duoneb PRN  CKD (chronic kidney disease), stage IV ([name]) Baseline serum creatinine appears to be around 2.2-2.4. Patient will need nephrology follow-up as an outpatient.    DVT prophylaxis: Heparin subq Code Status:   Code  Status: Full Code Family Communication: None at bedside Disposition Plan: Discharge pending bed availability vs improvement of functional ability to go home   Consultants:  Psychiatry Infectious disease  Procedures:    Antimicrobials:     Subjective: Patient reports persistent left sided chest pain that has been present for years and is described as sharp; this pain is unchanged from normal. He also reports some chest tightness. No dyspnea.  Objective: BP 136/80 (BP Location: Right Arm)   Pulse 76   Temp 97.8 F (36.6 C) (Oral)   Resp 19   Ht '5\' 8"'$  (1.727 m)   Wt 80 kg   SpO2 98%   BMI 26.82 kg/m   Examination:  General exam: Appears calm and comfortable Respiratory system: Clear to auscultation. Respiratory effort normal. Cardiovascular system: S1 & S2 heard. Gastrointestinal system: Abdomen is nondistended, soft and nontender. Normal bowel sounds heard. Central nervous system: Alert and oriented. No focal neurological deficits. Musculoskeletal: No edema. No calf tenderness Skin: No cyanosis. No rashes Psychiatry: Judgement and insight appear normal. Mood & affect appropriate.    Data Reviewed: I have personally reviewed following labs and imaging studies  CBC Lab Results  Component Value Date   WBC 4.8 09/17/2022   RBC 2.70 (L) 09/17/2022   HGB 8.0 (L) 09/18/2022   HCT 23.4 (L) 09/18/2022   MCV 85.9 09/17/2022   MCH 27.8 09/17/2022   PLT 104 (L) 09/17/2022   MCHC 32.3 09/17/2022   RDW 15.4 09/17/2022   LYMPHSABS 0.9 09/05/2022   MONOABS 0.5 09/05/2022   EOSABS 0.1 09/05/2022   BASOSABS 0.0 25/42/7062     Last metabolic panel Lab Results  Component Value Date   NA 139 09/23/2022   K 4.5 09/23/2022   CL 115 (H) 09/23/2022   CO2 20 (L) 09/23/2022  BUN 31 (H) 09/23/2022   CREATININE 3.17 (H) 09/23/2022   GLUCOSE 112 (H) 09/23/2022   GFRNONAA 20 (L) 09/23/2022   GFRAA 33 (L) 12/12/2019   CALCIUM 8.3 (L) 09/23/2022   PHOS 2.8 09/18/2022    PROT 7.8 09/09/2022   ALBUMIN 4.0 09/09/2022   LABGLOB 3.2 12/26/2016   AGRATIO 1.2 12/26/2016   BILITOT 0.5 09/09/2022   ALKPHOS 93 09/09/2022   AST 24 09/09/2022   ALT 27 09/09/2022   ANIONGAP 4 (L) 09/23/2022    GFR: Estimated Creatinine Clearance: 20.4 mL/min (A) (by C-G formula based on SCr of 3.17 mg/dL (H)).  No results found for this or any previous visit (from the past 240 hour(s)).    Radiology Studies: DG CHEST PORT 1 VIEW  Result Date: 09/23/2022 CLINICAL DATA:  Chest tightness. EXAM: PORTABLE CHEST 1 VIEW COMPARISON:  09/09/2022 FINDINGS: Lungs are adequately inflated and otherwise clear. Cardiomediastinal silhouette and remainder of the exam is unchanged. IMPRESSION: No active disease. Electronically Signed   By: Marin Olp M.D.   On: 09/23/2022 10:14      LOS: 13 days    Cordelia Poche, MD [facility] 09/23/2022, 2:27 PM   If 7PM-7AM, please contact night-coverage [email or web address]

## 2022-09-23 NOTE — TOC Progression Note (Addendum)
Transition of Care [name]) - Initial/Assessment Note    Patient Details  Name: Michael [name] MRN: [id number] Date of Birth: Nov 26, 1950  [facility] CM/SW Contact:    Milinda Antis, [place] Phone Number: 09/23/2022, 12:20 PM  Clinical Narrative:                 Patient does not have any bed offers at this time.  LCSW contacted [facility] and is awaiting a returned call.  LCSW also contacted [facility].  Archie Patten in admissions is reviewing the referral and will contact LCSW with a determination.    15:10-  [facility] unable to accept.  TOC following.   Expected Discharge Plan: Skilled Nursing Facility Barriers to Discharge: Continued Medical Work up, SNF Pending bed offer   Patient Goals and CMS Choice Patient states their goals for this hospitalization and ongoing recovery are:: strengthen myself      Expected Discharge Plan and Services Expected Discharge Plan: [place] In-house Referral: Clinical Social Work   Post Acute Care Choice: [place] Living arrangements for the past 2 months: Hotel/Motel                                      Prior Living Arrangements/Services Living arrangements for the past 2 months: Hotel/Motel Lives with:: Self Patient language and need for interpreter reviewed:: Yes Do you feel safe going back to the place where you live?: Yes      Need for Family Participation in Patient Care: No (Comment) Care giver support system in place?: Yes (comment) Current home services: Other (comment) (none) Criminal Activity/Legal Involvement Pertinent to Current Situation/Hospitalization: No - Comment as needed  Activities of Daily Living Home Assistive Devices/Equipment: Cane (specify quad or straight) ADL Screening (condition at time of admission) Patient's cognitive ability adequate to safely complete daily activities?: Yes Is the patient deaf or have difficulty hearing?:  No Does the patient have difficulty seeing, even when wearing glasses/contacts?: Yes Does the patient have difficulty concentrating, remembering, or making decisions?: Yes Patient able to express need for assistance with ADLs?: Yes Does the patient have difficulty dressing or bathing?: Yes Independently performs ADLs?: Yes (appropriate for developmental age) Does the patient have difficulty walking or climbing stairs?: Yes Weakness of Legs: Both Weakness of Arms/Hands: Both  Permission Sought/Granted Permission sought to share information with : Family Supports Permission granted to share information with : Yes, Verbal Permission Granted  Share Information with NAME: sister [name], son Corian  Permission granted to share info w AGENCY: SNF        Emotional Assessment Appearance:: Appears stated age Attitude/Demeanor/Rapport: Engaged Affect (typically observed): Appropriate, Pleasant Orientation: : Oriented to Self, Oriented to Place, Oriented to  Time, Oriented to Situation Alcohol / Substance Use: Illicit Drugs Psych Involvement: Yes (comment)  Admission diagnosis:  ARF (acute renal failure) ([name]) [N17.9] Polysubstance abuse ([name]) [F19.10] AKI (acute kidney injury) ([name]) [N17.9] Patient Active Problem List   Diagnosis Date Noted   Polysubstance abuse ([name]) 09/13/2022   ARF (acute renal failure) ([name]) 09/09/2022   (HFpEF) heart failure with preserved ejection fraction ([name]) 09/09/2022   Elevated troponin 09/09/2022   Impaired functional mobility, balance, and endurance 11/29/2021   Transfusion history 11/29/2021   Unstable balance 11/29/2021   Dilated cardiomyopathy ([name]) 11/29/2021   Arthritis 11/29/2021   Diabetic foot infection ([name]) 06/20/2021   Presence of aortocoronary bypass  graft 04/16/2021   Iron deficiency anemia 04/04/2021   Atypical chest pain 04/03/2021   Anemia of chronic disease 12/14/2020   Type 2 diabetes mellitus with hyperlipidemia ([name]) 08/14/2020    Idiopathic chronic gout of multiple sites without tophus 02/17/2020   Diabetic ulcer of toe of right foot associated with type 2 diabetes mellitus ([place]) 12/17/2019   Vitamin B12 deficiency 12/17/2019   Homelessness 12/13/2019   Cellulitis 12/07/2019   Osteomyelitis ([place]) 07/17/2019   Abscess or cellulitis of toe, right    Toe pain, right-second    MDD (major depressive disorder), recurrent episode, severe ([name]) 06/15/2019   Respiratory distress 04/12/2019   Pneumonia due to severe acute respiratory syndrome coronavirus 2 (SARS-CoV-2) 04/11/2019   COVID-19 virus infection 03/19/2019   Chest pain 10/03/2018   Malnutrition of moderate degree 09/21/2018   MDD (major depressive disorder), recurrent episode, moderate ([name])    Type II diabetes mellitus with renal manifestations ([name]) 05/04/2018   GERD (gastroesophageal reflux disease) 05/04/2018   S/P CABG (coronary artery bypass graft)    Left testicular pain    Depression 02/20/2018   Epididymo-orchitis, acute 02/20/2018   Essential hypertension 02/20/2018   Precordial chest pain 02/20/2018   Acute kidney injury superimposed on chronic kidney disease ([name]) 02/14/2018   HCAP (healthcare-associated pneumonia) 02/14/2018   History of pulmonary embolism 07/04/2017   Acute hyponatremia 05/11/2017   Hyperglycemia due to type 2 diabetes mellitus ([name]) 05/11/2017   CHF (congestive heart failure) ([place]) 04/01/2017   Hepatitis C 12/30/2016   Substance induced mood disorder ([name]) 11/23/2016   Chronic ischemic heart disease 11/12/2016   Paroxysmal A-fib ([name]) 11/12/2016   Back pain 04/18/2016   S/P carotid endarterectomy 11/15/2015   COPD (chronic obstructive pulmonary disease) ([place]) 10/22/2015   Stenosis of cervical spine with myelopathy ([place]) 10/20/2015   MDD (major depressive disorder), recurrent severe, without psychosis ([name]) 09/09/2015   History of fusion of cervical spine 08/28/2015   Cocaine-induced mood disorder ([name]) 08/14/2015    Cocaine abuse with cocaine-induced mood disorder ([name]) 08/14/2015   Gout 07/10/2015   Acute renal failure superimposed on stage 3 chronic kidney disease ([place]) 03/06/2015   Anemia 03/06/2015   Chronic kidney disease, stage III (moderate) ([place]) 03/06/2015   Hypoglycemia    Encounter for general adult medical examination with abnormal findings 02/09/2015   Cocaine use disorder, moderate, dependence ([place]) 12/13/2014   Substance or medication-induced depressive disorder with onset during withdrawal ([place]) 12/13/2014   Severe recurrent major depressive disorder with psychotic features ([place]) 12/12/2014   Major depressive disorder, recurrent, severe without psychotic features ([place])    Suicidal ideations 08/15/2014   Cervicalgia 06/28/2014   Lumbar radiculopathy, chronic 06/28/2014   Asthma, chronic 02/03/2014   S/P percutaneous transluminal coronary angioplasty 10/15/2013   3-vessel CAD 06/24/2013   ED (erectile dysfunction) of organic origin 07/07/2012   Cocaine abuse, continuous ([place]) 05/13/2012   Hypertension goal BP (blood pressure) < 140/80 04/29/2012   Chondromalacia of left knee 03/19/2012   Hyperlipidemia with target LDL less than 100 02/12/2012   Fibromyalgia 02/12/2012   Cocaine use disorder ([place]) 01/10/2012    Class: Acute   Human immunodeficiency virus (HIV) disease ([place]) 12/16/2011   HIV (human immunodeficiency virus infection) ([place]) 08/27/2011   Uncontrolled type 2 diabetes with neuropathy 10/17/2000   PCP:  [name], MD Pharmacy:   [name] [address] [place] [address] Phone: [phone] Fax: [place] [address]  [place] [address] Phone: [phone] Fax: [place], [place] - [address] [place] [address] Phone: [phone] Fax: [phone]     Social Determinants of Health ([place])  Interventions    Readmission Risk Interventions    07/05/2022   10:08 AM  Readmission Risk Prevention Plan  Transportation Screening Complete  Medication Review ([place]) Complete  PCP or Specialist appointment within 3-5 days of discharge Complete  HRI or [place] Complete  SW Recovery Care/Counseling Consult Complete  [place] Not Applicable

## 2022-09-24 DIAGNOSIS — N179 Acute kidney failure, unspecified: Secondary | ICD-10-CM | POA: Diagnosis not present

## 2022-09-24 DIAGNOSIS — B20 Human immunodeficiency virus [HIV] disease: Secondary | ICD-10-CM | POA: Diagnosis not present

## 2022-09-24 DIAGNOSIS — I5032 Chronic diastolic (congestive) heart failure: Secondary | ICD-10-CM | POA: Diagnosis not present

## 2022-09-24 DIAGNOSIS — I10 Essential (primary) hypertension: Secondary | ICD-10-CM | POA: Diagnosis not present

## 2022-09-24 LAB — BASIC METABOLIC PANEL
Anion gap: 5 (ref 5–15)
BUN: 30 mg/dL — ABNORMAL HIGH (ref 8–23)
CO2: 20 mmol/L — ABNORMAL LOW (ref 22–32)
Calcium: 8.4 mg/dL — ABNORMAL LOW (ref 8.9–10.3)
Chloride: 116 mmol/L — ABNORMAL HIGH (ref 98–111)
Creatinine, Ser: 3.06 mg/dL — ABNORMAL HIGH (ref 0.61–1.24)
GFR, Estimated: 21 mL/min — ABNORMAL LOW (ref >60)
Glucose, Bld: 94 mg/dL (ref 70–99)
Potassium: 4.4 mmol/L (ref 3.5–5.1)
Sodium: 141 mmol/L (ref 135–145)

## 2022-09-24 LAB — GLUCOSE, CAPILLARY
Glucose-Capillary: 96 mg/dL (ref 70–99)
Glucose-Capillary: 108 mg/dL — ABNORMAL HIGH (ref 70–99)
Glucose-Capillary: 162 mg/dL — ABNORMAL HIGH (ref 70–99)

## 2022-09-24 NOTE — Progress Notes (Signed)
Occupational Therapy Treatment Patient Details Name: Michael [name] MRN: [id number] DOB: [birth year] Today's Date: 09/24/2022   History of present illness 71 y.o. male admitted 09/09/22 with chest pain, AKI on CKD stage IIIb, Suicidal ideations in the setting of depression. Recently hospitalized 11/30-12/1/23. PMHx:HTN, chronic HFrEF with recovered LVEF 60 to 65%, PAF on Eliquis, CAD s/p CABG and stenting, DM, CKD stage IIIb, HIV on HAART, and cocaine abuse.   OT comments  Pt making steady progress towards OT goals this session. Pt continues to present with decreased activity tolerance impacting pts ability to complete BADLs independently. Pt currently requires min guard for ADL transfers with RW and supervision for standing ADLS at sink with no UE support. Pt able to stand at sink for ~ 5 mins before needing to sit and rest d/t fatigue. Pt would continue to benefit from skilled occupational therapy while admitted and after d/c to address the below listed limitations in order to improve overall functional mobility and facilitate independence with BADL participation. DC plan remains appropriate, will follow acutely per POC. Pt handed off directly to PT for next session.      Recommendations for follow up therapy are one component of a multi-disciplinary discharge planning process, led by the attending physician.  Recommendations may be updated based on patient status, additional functional criteria and insurance authorization.    Follow Up Recommendations  Skilled nursing-short term rehab (<3 hours/day)     Assistance Recommended at Discharge Frequent or constant Supervision/Assistance  Patient can return home with the following  Assist for transportation;A little help with walking and/or transfers;A little help with bathing/dressing/bathroom   Equipment Recommendations  None recommended by OT    Recommendations for Other Services      Precautions / Restrictions  Precautions Precautions: Fall Restrictions Weight Bearing Restrictions: No       Mobility Bed Mobility Overal bed mobility: Modified Independent             General bed mobility comments: extra time    Transfers Overall transfer level: Needs assistance Equipment used: Rolling walker (2 wheels) Transfers: Sit to/from Stand Sit to Stand: Min assist           General transfer comment: initial MIN A to rise from EOB with assist for steadying     Balance Overall balance assessment: Needs assistance Sitting-balance support: No upper extremity supported, Feet supported Sitting balance-Leahy Scale: Good     Standing balance support: No upper extremity supported, During functional activity Standing balance-Leahy Scale: Fair Standing balance comment: standing at sink for ~ 5 mins to complete standing grooming tasks with no LOB                           ADL either performed or assessed with clinical judgement   ADL Overall ADL's : Needs assistance/impaired     Grooming: Wash/dry hands;Wash/dry face;Oral care;Standing;Supervision/safety Grooming Details (indicate cue type and reason): supervision for standing grooming tasks at sink with moments of no UE support with no LOB, increased fatigue noted from task         Upper Body Dressing : Set up;Sitting Upper Body Dressing Details (indicate cue type and reason): to don back side gown     Toilet Transfer: Min guard;Ambulation;Rolling walker (2 wheels) Toilet Transfer Details (indicate cue type and reason): simulated via functional mobility with RW         Functional mobility during ADLs: Min guard;Rolling walker (2 wheels) General ADL Comments:  ADL participation impacted by decreased activity tolerance this session    Extremity/Trunk Assessment Upper Extremity Assessment Upper Extremity Assessment: Generalized weakness   Lower Extremity Assessment Lower Extremity Assessment: Defer to PT evaluation         Vision Baseline Vision/History: 0 No visual deficits Patient Visual Report: Blurring of vision;No change from baseline;Other (comment);Diplopia (pt reprots blurry vision and double vision has improved although pt opts to still wear occlusion glasses)     Perception Perception Perception: Within Functional Limits   Praxis Praxis Praxis: Intact    Cognition Arousal/Alertness: Awake/alert Behavior During Therapy: WFL for tasks assessed/performed Overall Cognitive Status: Within Functional Limits for tasks assessed                                          Exercises      Shoulder Instructions       General Comments      Pertinent Vitals/ Pain       Pain Assessment Pain Assessment: Faces Faces Pain Scale: Hurts a little bit Pain Location: law back and chest  Home Living                                          Prior Functioning/Environment              Frequency  Min 2X/week        Progress Toward Goals  OT Goals(current goals can now be found in the care plan section)  Progress towards OT goals: Progressing toward goals  Acute Rehab OT Goals Patient Stated Goal: none stated Potential to Achieve Goals: Good  Plan Discharge plan remains appropriate;Frequency remains appropriate    Co-evaluation                 AM-PAC OT "6 Clicks" Daily Activity     Outcome Measure   Help from another person eating meals?: None Help from another person taking care of personal grooming?: A Little Help from another person toileting, which includes using toliet, bedpan, or urinal?: A Little Help from another person bathing (including washing, rinsing, drying)?: A Little Help from another person to put on and taking off regular upper body clothing?: A Little Help from another person to put on and taking off regular lower body clothing?: A Little 6 Click Score: 19    End of Session Equipment Utilized During Treatment:  Gait belt;Rolling walker (2 wheels)  OT Visit Diagnosis: Unsteadiness on feet (R26.81);Muscle weakness (generalized) (M62.81)   Activity Tolerance Patient tolerated treatment well   Patient Left in bed;Other (comment) (seated EOB pt handed off to PT)   Nurse Communication Mobility status        Time: 9485-4627 OT Time Calculation (min): 15 min  Charges: OT General Charges $OT Visit: 1 Visit OT Treatments $Self Care/Home Management : 8-22 mins  Harley Alto., COTA/L Acute Rehabilitation Services [phone]   Precious Haws 09/24/2022, 12:14 PM

## 2022-09-24 NOTE — Progress Notes (Signed)
Physical Therapy Treatment Patient Details Name: Michael [name] MRN: [id number] DOB: [birth year] Today's Date: 09/24/2022   History of Present Illness 71 y.o. male admitted 09/09/22 with chest pain, AKI on CKD stage IIIb, Suicidal ideations in the setting of depression. Recently hospitalized 11/30-12/1/23. PMHx:HTN, chronic HFrEF with recovered LVEF 60 to 65%, PAF on Eliquis, CAD s/p CABG and stenting, DM, CKD stage IIIb, HIV on HAART, and cocaine abuse.    PT Comments    Further transfer and gait training to improve safety and independence with mobility. Tolerated longer distance following exercises today. One episode of LOB but not specific with with prior noted strength and balance deficits. Able to self correct with use of RW for support. States visual occlusion glasses made by OT is helpful at reducing dizziness while he is ambulating. Min guard with sit<>stand transfers today. Tolerated LE exercises well. Remains eager to work with therapies. Patient will continue to benefit from skilled physical therapy services to further improve independence with functional mobility.    Recommendations for follow up therapy are one component of a multi-disciplinary discharge planning process, led by the attending physician.  Recommendations may be updated based on patient status, additional functional criteria and insurance authorization.  Follow Up Recommendations  Skilled nursing-short term rehab (<3 hours/day) Can patient physically be transported by private vehicle: Yes   Assistance Recommended at Discharge Set up Supervision/Assistance  Patient can return home with the following A little help with walking and/or transfers;A little help with bathing/dressing/bathroom;Direct supervision/assist for medications management;Direct supervision/assist for financial management;Assist for transportation;Assistance with cooking/housework   Equipment Recommendations  Rolling walker (2 wheels);BSC/3in1     Recommendations for Other Services Other (comment)     Precautions / Restrictions Precautions Precautions: Fall Restrictions Weight Bearing Restrictions: No     Mobility  Bed Mobility Overal bed mobility: Modified Independent             General bed mobility comments: extra time    Transfers Overall transfer level: Needs assistance Equipment used: Rolling walker (2 wheels) Transfers: Sit to/from Stand Sit to Stand: Min guard           General transfer comment: Performed at min guard level, practiced multiple times with cues for hand placement and greater anterior weight shift with hip flexion. No physical assist needed today, one uncontrolled sit but otherwise great improvement.    Ambulation/Gait Ambulation/Gait assistance: Min guard Gait Distance (Feet): 100 Feet Assistive device: Rolling walker (2 wheels) Gait Pattern/deviations: Step-through pattern, Decreased stride length, Shuffle, Narrow base of support Gait velocity: slow Gait velocity interpretation: <1.31 ft/sec, indicative of household ambulator   General Gait Details: Improved distance today. Still focusing on increased stride and pace. Very slow, one episode of spontaneous instability but able to self correc with RW (pt picked up Lt side of RW without trunk LOB so unsure why this occurred.) Min guard for safety. Reports wearing visual occlusion glasses improve dizziness while walking.   Stairs             Wheelchair Mobility    Modified Rankin (Stroke Patients Only)       Balance Overall balance assessment: Needs assistance Sitting-balance support: No upper extremity supported, Feet supported Sitting balance-Leahy Scale: Good     Standing balance support: No upper extremity supported, During functional activity Standing balance-Leahy Scale: Fair  Cognition Arousal/Alertness: Awake/alert Behavior During Therapy: WFL for tasks  assessed/performed Overall Cognitive Status: Within Functional Limits for tasks assessed                                          Exercises General Exercises - Lower Extremity Quad Sets: Strengthening, Both, 15 reps, Supine Gluteal Sets: Strengthening, Both, 15 reps, Supine Long Arc Quad: Strengthening, Both, Seated, 15 reps Hip ABduction/ADduction: Strengthening, Both, 15 reps, Supine (towel squeeze, hip abduction wit belt 2 sets) Hip Flexion/Marching: Strengthening, Both, 15 reps, Standing Toe Raises: Strengthening, Both, 10 reps, Standing Heel Raises: Strengthening, Both, 10 reps, Standing    General Comments        Pertinent Vitals/Pain Pain Assessment Pain Assessment: Faces Faces Pain Scale: Hurts little more Pain Location: Low back pain Pain Descriptors / Indicators: Discomfort, Grimacing Pain Intervention(s): Monitored during session, Repositioned    Home Living                          Prior Function            PT Goals (current goals can now be found in the care plan section) Acute Rehab PT Goals Patient Stated Goal: Get physical rehab, then go to substance abuse rehab. PT Goal Formulation: With patient Time For Goal Achievement: 09/27/22 Potential to Achieve Goals: Good Progress towards PT goals: Progressing toward goals    Frequency    Min 3X/week      PT Plan Current plan remains appropriate    Co-evaluation              AM-PAC PT "6 Clicks" Mobility   Outcome Measure  Help needed turning from your back to your side while in a flat bed without using bedrails?: None Help needed moving from lying on your back to sitting on the side of a flat bed without using bedrails?: A Little Help needed moving to and from a bed to a chair (including a wheelchair)?: A Little Help needed standing up from a chair using your arms (e.g., wheelchair or bedside chair)?: A Little Help needed to walk in hospital room?: A Little Help  needed climbing 3-5 steps with a railing? : A Lot 6 Click Score: 18    End of Session Equipment Utilized During Treatment: Gait belt Activity Tolerance: Patient tolerated treatment well Patient left: in chair;with call bell/phone within reach;with chair alarm set Nurse Communication: Mobility status PT Visit Diagnosis: Unsteadiness on feet (R26.81);Other abnormalities of gait and mobility (R26.89);History of falling (Z91.81);Muscle weakness (generalized) (M62.81);Difficulty in walking, not elsewhere classified (R26.2)     Time: 5465-0354 PT Time Calculation (min) (ACUTE ONLY): 32 min  Charges:  $Gait Training: 8-22 mins $Therapeutic Exercise: 8-22 mins                     [name], PT, DPT Physical Therapist Acute Rehabilitation Services [place] 09/24/2022, 11:06 AM

## 2022-09-24 NOTE — Progress Notes (Signed)
PROGRESS NOTE    Michael [name]  TIW:[id number] DOB: 06-26-1951 DOA: 09/09/2022 PCP: [name], MD   Brief Narrative: Michael [name] is a 71 y.o. male with a history of hypertension, chronic heart failure, paroxysmal atrial fibrillation, CAD s/p CABG/PCI, CKD, HIV, cocaine abuse. [name]. Admission complicated by significant AKI that responded to IV fluids. Discharge complicated by inability to place patient at a SNF.   Assessment and Plan: Acute kidney injury superimposed on chronic kidney disease ([place]) Creatinine of 6.32 on admission. Associated BUN of 81. Patient started on IV fluids with improvement of renal function towards baseline. -Recheck metabolic panel in AM  Essential hypertension -Continue diltiazem, hydralazine, and Imdur  Paroxysmal A-fib ([name]) Patient is not on anticoagulation secondary to history of high bleeding risk -Continue diltiazem  (HFpEF) heart failure with preserved ejection fraction ([name]) Stable. Not on diuretic therapy.  HIV (human immunodeficiency virus infection) ([place]) -Continue Juluca  History of pulmonary embolism Noted. Not on anticoagulation.  Type 2 diabetes mellitus with hyperlipidemia ([name]) Hemoglobin A1C of 7.7%. Adequately controlled for age.  Polysubstance abuse ([place]) Noted. History of cocaine use.  Suicidal ideations Evaluated by psychiatry. No role for inpatient admission. -Continue trazodone and fluoxetine  COPD (chronic obstructive pulmonary disease) ([place]) Patient with some chest tightness today but no wheezing or dyspnea. -Continue Incruse Ellipta, Dulera and Duoneb PRN  CKD (chronic kidney disease), stage IV ([name]) Baseline serum creatinine appears to be around 2.2-2.4. Patient will need nephrology follow-up as an outpatient.    DVT prophylaxis: Heparin subq Code Status:   Code Status: Full Code Family Communication: None at  bedside Disposition Plan: Discharge pending bed availability vs improvement of functional ability to go home   Consultants:  Psychiatry Infectious disease  Procedures:    Antimicrobials:     Subjective: Patient reports no concerns this morning.  Objective: BP (!) 142/87   Pulse 80   Temp 98.2 F (36.8 C) (Oral)   Resp 19   Ht '5\' 8"'$  (1.727 m)   Wt 75.7 kg   SpO2 95%   BMI 25.38 kg/m   Examination:  General exam: Appears calm and comfortable Respiratory system: Respiratory effort normal. Gastrointestinal system: Abdomen is non-distended Central nervous system: Alert and oriented. Psychiatry: Judgement and insight appear normal. Mood & affect appropriate.    Data Reviewed: I have personally reviewed following labs and imaging studies  CBC Lab Results  Component Value Date   WBC 4.8 09/17/2022   RBC 2.70 (L) 09/17/2022   HGB 8.0 (L) 09/18/2022   HCT 23.4 (L) 09/18/2022   MCV 85.9 09/17/2022   MCH 27.8 09/17/2022   PLT 104 (L) 09/17/2022   MCHC 32.3 09/17/2022   RDW 15.4 09/17/2022   LYMPHSABS 0.9 09/05/2022   MONOABS 0.5 09/05/2022   EOSABS 0.1 09/05/2022   BASOSABS 0.0 25/02/3975     Last metabolic panel Lab Results  Component Value Date   NA 141 09/24/2022   K 4.4 09/24/2022   CL 116 (H) 09/24/2022   CO2 20 (L) 09/24/2022   BUN 30 (H) 09/24/2022   CREATININE 3.06 (H) 09/24/2022   GLUCOSE 94 09/24/2022   GFRNONAA 21 (L) 09/24/2022   GFRAA 33 (L) 12/12/2019   CALCIUM 8.4 (L) 09/24/2022   PHOS 2.8 09/18/2022   PROT 7.8 09/09/2022   ALBUMIN 4.0 09/09/2022   LABGLOB 3.2 12/26/2016   AGRATIO 1.2 12/26/2016   BILITOT 0.5 09/09/2022   ALKPHOS 93  09/09/2022   AST 24 09/09/2022   ALT 27 09/09/2022   ANIONGAP 5 09/24/2022    GFR: Estimated Creatinine Clearance: 21.1 mL/min (A) (by C-G formula based on SCr of 3.06 mg/dL (H)).  No results found for this or any previous visit (from the past 240 hour(s)).    Radiology Studies: DG CHEST PORT 1  VIEW  Result Date: 09/23/2022 CLINICAL DATA:  Chest tightness. EXAM: PORTABLE CHEST 1 VIEW COMPARISON:  09/09/2022 FINDINGS: Lungs are adequately inflated and otherwise clear. Cardiomediastinal silhouette and remainder of the exam is unchanged. IMPRESSION: No active disease. Electronically Signed   By: Marin Olp M.D.   On: 09/23/2022 10:14      LOS: 14 days    Cordelia Poche, MD [facility] 09/24/2022, 4:53 PM   If 7PM-7AM, please contact night-coverage [email or web address]

## 2022-09-25 DIAGNOSIS — B20 Human immunodeficiency virus [HIV] disease: Secondary | ICD-10-CM | POA: Diagnosis not present

## 2022-09-25 DIAGNOSIS — N179 Acute kidney failure, unspecified: Secondary | ICD-10-CM | POA: Diagnosis not present

## 2022-09-25 DIAGNOSIS — I5032 Chronic diastolic (congestive) heart failure: Secondary | ICD-10-CM | POA: Diagnosis not present

## 2022-09-25 DIAGNOSIS — I10 Essential (primary) hypertension: Secondary | ICD-10-CM | POA: Diagnosis not present

## 2022-09-25 LAB — BASIC METABOLIC PANEL
Anion gap: 6 (ref 5–15)
BUN: 29 mg/dL — ABNORMAL HIGH (ref 8–23)
CO2: 20 mmol/L — ABNORMAL LOW (ref 22–32)
Calcium: 8.5 mg/dL — ABNORMAL LOW (ref 8.9–10.3)
Chloride: 115 mmol/L — ABNORMAL HIGH (ref 98–111)
Creatinine, Ser: 3.02 mg/dL — ABNORMAL HIGH (ref 0.61–1.24)
GFR, Estimated: 21 mL/min — ABNORMAL LOW (ref >60)
Glucose, Bld: 101 mg/dL — ABNORMAL HIGH (ref 70–99)
Potassium: 4.2 mmol/L (ref 3.5–5.1)
Sodium: 141 mmol/L (ref 135–145)

## 2022-09-25 LAB — GLUCOSE, CAPILLARY
Glucose-Capillary: 105 mg/dL — ABNORMAL HIGH (ref 70–99)
Glucose-Capillary: 122 mg/dL — ABNORMAL HIGH (ref 70–99)
Glucose-Capillary: 140 mg/dL — ABNORMAL HIGH (ref 70–99)
Glucose-Capillary: 144 mg/dL — ABNORMAL HIGH (ref 70–99)

## 2022-09-25 MED ORDER — SODIUM CHLORIDE 0.45 % IV SOLN: INTRAVENOUS | Status: DC

## 2022-09-25 MED ORDER — SODIUM CHLORIDE 0.9 % IV BOLUS
500.0000 mL | Freq: Once | INTRAVENOUS | Status: AC
  Administered 2022-09-25: 500 mL via INTRAVENOUS

## 2022-09-25 NOTE — Progress Notes (Signed)
Nutrition Follow-up  DOCUMENTATION CODES:   Non-severe (moderate) malnutrition in context of chronic illness  INTERVENTION:  - Continue Ensure Enlive po BID, each supplement provides 350 kcal and 20 grams of protein.   NUTRITION DIAGNOSIS:   Moderate Malnutrition related to chronic illness (CHF, CKD, HIV) as evidenced by moderate fat depletion, severe muscle depletion.  GOAL:   Patient will meet greater than or equal to 90% of their needs  MONITOR:   Supplement acceptance, Labs, PO intake, Weight trends  REASON FOR ASSESSMENT:   Malnutrition Screening Tool    ASSESSMENT:   71 year old male who presented to the ED on 12/04 with chest pain. PMH of HTN, CHF, PAF, CAD s/p CABG and stenting, CKD stage IIIb, HIV on HAART, cocaine abuse, T2DM. Pt admitted with AKI on CKD, suicidal ideation, chest pain.  Meds include: lipitor, sliding scale insulin, rena-vit, miralax. Labs reviewed: BUN/Creatinine high.   Pt ate 100% of his breakfast this am. Pt continues to eat well and meet his needs. Pt having some issues with constipation. Miralax has been ordered. Pt is awaiting SNF placement.   Diet Order:   Diet Order             Diet regular Room service appropriate? Yes; Fluid consistency: Thin  Diet effective now                   EDUCATION NEEDS:   Education needs have been addressed  Skin:  Skin Assessment: Reviewed RN Assessment  Last BM:  09/21/22  Height:   Ht Readings from Last 1 Encounters:  09/12/22 '5\' 8"'$  (1.727 m)    Weight:   Wt Readings from Last 1 Encounters:  09/23/22 75.7 kg    Ideal Body Weight:     BMI:  Body mass index is 25.38 kg/m.  Estimated Nutritional Needs:   Kcal:  2000-2200  Protein:  95-110 grams  Fluid:  >2.0 L  Thalia Bloodgood, RD, LDN, CNSC.

## 2022-09-25 NOTE — Progress Notes (Signed)
PROGRESS NOTE    Michael [name]  AJO:[id number] DOB: [birth year] DOA: 09/09/2022 PCP: [name], MD   Brief Narrative: Michael [name] is a 71 y.o. male with a history of hypertension, chronic heart failure, paroxysmal atrial fibrillation, CAD s/p CABG/PCI, CKD, HIV, cocaine abuse. [name]. Admission complicated by significant AKI that responded to IV fluids. Discharge complicated by inability to place patient at a SNF.   Assessment and Plan: Acute kidney injury superimposed on chronic kidney disease ([place]) Creatinine of 6.32 on admission. Associated BUN of 81. Patient started on IV fluids with improvement of renal function towards baseline. -500 mL NS IV fluids -Recheck metabolic panel in AM  Essential hypertension -Continue diltiazem, hydralazine, and Imdur  Paroxysmal A-fib ([name]) Patient is not on anticoagulation secondary to history of high bleeding risk -Continue diltiazem  (HFpEF) heart failure with preserved ejection fraction ([name]) Stable. Not on diuretic therapy.  HIV (human immunodeficiency virus infection) ([place]) -Continue Juluca  History of pulmonary embolism Noted. Not on anticoagulation.  Type 2 diabetes mellitus with hyperlipidemia ([name]) Hemoglobin A1C of 7.7%. Adequately controlled for age.  Polysubstance abuse ([place]) Noted. History of cocaine use.  Suicidal ideations Evaluated by psychiatry. No role for inpatient admission. -Continue trazodone and fluoxetine  COPD (chronic obstructive pulmonary disease) ([place]) Patient with some chest tightness today but no wheezing or dyspnea. -Continue Incruse Ellipta, Dulera and Duoneb PRN  CKD (chronic kidney disease), stage IV ([name]) Baseline serum creatinine appears to be around 2.2-2.4. Patient will need nephrology follow-up as an outpatient.    DVT prophylaxis: Heparin subq Code Status:   Code Status: Full Code Family  Communication: None at bedside Disposition Plan: Discharge pending bed availability vs improvement of functional ability to go home   Consultants:  Psychiatry Infectious disease  Procedures:    Antimicrobials:     Subjective: No issues noted overnight.  Objective: BP 137/72 (BP Location: Right Arm)   Pulse 84   Temp 98.5 F (36.9 C) (Oral)   Resp 17   Ht '5\' 8"'$  (1.727 m)   Wt 75.7 kg   SpO2 96%   BMI 25.38 kg/m   Examination:  General exam: Appears calm and comfortable   Data Reviewed: I have personally reviewed following labs and imaging studies  CBC Lab Results  Component Value Date   WBC 4.8 09/17/2022   RBC 2.70 (L) 09/17/2022   HGB 8.0 (L) 09/18/2022   HCT 23.4 (L) 09/18/2022   MCV 85.9 09/17/2022   MCH 27.8 09/17/2022   PLT 104 (L) 09/17/2022   MCHC 32.3 09/17/2022   RDW 15.4 09/17/2022   LYMPHSABS 0.9 09/05/2022   MONOABS 0.5 09/05/2022   EOSABS 0.1 09/05/2022   BASOSABS 0.0 94/70/9628     Last metabolic panel Lab Results  Component Value Date   NA 141 09/25/2022   K 4.2 09/25/2022   CL 115 (H) 09/25/2022   CO2 20 (L) 09/25/2022   BUN 29 (H) 09/25/2022   CREATININE 3.02 (H) 09/25/2022   GLUCOSE 101 (H) 09/25/2022   GFRNONAA 21 (L) 09/25/2022   GFRAA 33 (L) 12/12/2019   CALCIUM 8.5 (L) 09/25/2022   PHOS 2.8 09/18/2022   PROT 7.8 09/09/2022   ALBUMIN 4.0 09/09/2022   LABGLOB 3.2 12/26/2016   AGRATIO 1.2 12/26/2016   BILITOT 0.5 09/09/2022   ALKPHOS 93 09/09/2022   AST 24 09/09/2022   ALT 27 09/09/2022   ANIONGAP 6 09/25/2022    GFR:  Estimated Creatinine Clearance: 21.4 mL/min (A) (by C-G formula based on SCr of 3.02 mg/dL (H)).  No results found for this or any previous visit (from the past 240 hour(s)).    Radiology Studies: No results found.    LOS: 15 days    Cordelia Poche, MD [facility] 09/25/2022, 12:14 PM   If 7PM-7AM, please contact night-coverage [email or web address]

## 2022-09-25 NOTE — Progress Notes (Signed)
Mobility Specialist Progress Note:   09/25/22 1235  Mobility  Activity Ambulated with assistance in hallway  Level of Assistance Contact guard assist, steadying assist  Assistive Device Front wheel walker  Distance Ambulated (ft) 115 ft  Activity Response Tolerated well  Mobility Referral Yes  $Mobility charge 1 Mobility   Pt eager for mobility session. Required only minG for ambulation. Pt left in chair with all needs met, chair alarm on.  Nelta Numbers Mobility Specialist Please contact via SecureChat or  Rehab office at [phone]

## 2022-09-26 ENCOUNTER — Inpatient Hospital Stay: Payer: Medicare Other | Admitting: Infectious Diseases

## 2022-09-26 ENCOUNTER — Inpatient Hospital Stay (HOSPITAL_COMMUNITY): Payer: Medicare Other

## 2022-09-26 ENCOUNTER — Ambulatory Visit: Payer: Medicare Other

## 2022-09-26 DIAGNOSIS — N179 Acute kidney failure, unspecified: Secondary | ICD-10-CM | POA: Diagnosis not present

## 2022-09-26 DIAGNOSIS — I5032 Chronic diastolic (congestive) heart failure: Secondary | ICD-10-CM | POA: Diagnosis not present

## 2022-09-26 DIAGNOSIS — I10 Essential (primary) hypertension: Secondary | ICD-10-CM | POA: Diagnosis not present

## 2022-09-26 DIAGNOSIS — B20 Human immunodeficiency virus [HIV] disease: Secondary | ICD-10-CM | POA: Diagnosis not present

## 2022-09-26 LAB — BASIC METABOLIC PANEL
Anion gap: 6 (ref 5–15)
BUN: 35 mg/dL — ABNORMAL HIGH (ref 8–23)
CO2: 20 mmol/L — ABNORMAL LOW (ref 22–32)
Calcium: 8.4 mg/dL — ABNORMAL LOW (ref 8.9–10.3)
Chloride: 115 mmol/L — ABNORMAL HIGH (ref 98–111)
Creatinine, Ser: 3.13 mg/dL — ABNORMAL HIGH (ref 0.61–1.24)
GFR, Estimated: 20 mL/min — ABNORMAL LOW (ref >60)
Glucose, Bld: 142 mg/dL — ABNORMAL HIGH (ref 70–99)
Potassium: 4.4 mmol/L (ref 3.5–5.1)
Sodium: 141 mmol/L (ref 135–145)

## 2022-09-26 LAB — GLUCOSE, CAPILLARY
Glucose-Capillary: 97 mg/dL (ref 70–99)
Glucose-Capillary: 99 mg/dL (ref 70–99)
Glucose-Capillary: 143 mg/dL — ABNORMAL HIGH (ref 70–99)

## 2022-09-26 MED ORDER — SODIUM CHLORIDE 0.9 % IV SOLN: INTRAVENOUS | Status: AC

## 2022-09-26 MED ORDER — HYDROCODONE-ACETAMINOPHEN 5-325 MG PO TABS
1.0000 | ORAL_TABLET | Freq: Four times a day (QID) | ORAL | Status: DC | PRN
  Administered 2022-09-26 – 2022-10-01 (×6): 1 via ORAL
  Administered 2022-10-02 – 2022-10-06 (×3): 2 via ORAL
  Administered 2022-10-07: 1 via ORAL
  Administered 2022-10-07 – 2022-10-14 (×7): 2 via ORAL
  Filled 2022-09-26 (×6): qty 2
  Filled 2022-09-26: qty 1
  Filled 2022-09-26: qty 2
  Filled 2022-09-26 (×2): qty 1
  Filled 2022-09-26 (×2): qty 2
  Filled 2022-09-26 (×4): qty 1
  Filled 2022-09-26: qty 2

## 2022-09-26 NOTE — Progress Notes (Signed)
Mobility Specialist Progress Note:   09/26/22 1025  Mobility  Activity Ambulated with assistance in room  Level of Assistance Contact guard assist, steadying assist  Assistive Device Front wheel walker  Distance Ambulated (ft) 10 ft  Activity Response Tolerated well  Mobility Referral Yes  $Mobility charge 1 Mobility   Pt requesting to go to sink to wash face and brush teeth. Required minG for safety, no physical assistance. Pt back in bed with all needs met.   Nelta Numbers Mobility Specialist Please contact via SecureChat or  Rehab office at [phone]

## 2022-09-26 NOTE — Progress Notes (Signed)
Physical Therapy Treatment Patient Details Name: Michael [name] MRN: [id number] DOB: 1951-02-06 Today's Date: 09/26/2022   History of Present Illness 71 y.o. male admitted 09/09/22 with chest pain, AKI on CKD stage IIIb, Suicidal ideations in the setting of depression. Recently hospitalized 11/30-12/1/23. PMHx:HTN, chronic HFrEF with recovered LVEF 60 to 65%, PAF on Eliquis, CAD s/p CABG and stenting, DM, CKD stage IIIb, HIV on HAART, and cocaine abuse.    PT Comments    STAR PT/OT Session: Educated pt on [employer] and commitment. Pt agreeable. Pt with reports of pain in back with use of RW and request to use cane. PT provided upright Rollator for trial and pt indicates that pain is less in his back and appreciates place to be able to sit when fatigued. Pt limited by increased nausea with mobilization, reporting nausea is often a limiting factor to his mobility. PT will work with discharge team to fully understand level of mobilization and self care needed for drug rehab facility and likelihood that pt can actually discharge there.     Recommendations for follow up therapy are one component of a multi-disciplinary discharge planning process, led by the attending physician.  Recommendations may be updated based on patient status, additional functional criteria and insurance authorization.  Follow Up Recommendations  Other (comment) (drug rehab) Can patient physically be transported by private vehicle: Yes   Assistance Recommended at Discharge Set up Supervision/Assistance  Patient can return home with the following A little help with walking and/or transfers;A little help with bathing/dressing/bathroom;Direct supervision/assist for medications management;Direct supervision/assist for financial management;Assist for transportation;Assistance with cooking/housework   Equipment Recommendations  Other (comment) (upright rollator)    Recommendations for Other Services Other (comment)      Precautions / Restrictions Precautions Precautions: Fall Restrictions Weight Bearing Restrictions: No     Mobility  Bed Mobility Overal bed mobility: Modified Independent             General bed mobility comments: extra time    Transfers Overall transfer level: Needs assistance Equipment used: Rollator (4 wheels) (platform) Transfers: Sit to/from Stand Sit to Stand: Min assist           General transfer comment: min assist with education use of brakes and turning with rollator    Ambulation/Gait Ambulation/Gait assistance: Min assist Gait Distance (Feet): 80 Feet (x2) Assistive device: Rollator (4 wheels) (upright.) Gait Pattern/deviations: Step-through pattern, Decreased step length - right, Decreased step length - left, Narrow base of support Gait velocity: slowed Gait velocity interpretation: <1.31 ft/sec, indicative of household ambulator   General Gait Details: min A for navigation with upright Rollator, pt appreciative of more upright posture and decreased back pain and fatigue. cuing for use of brakes, and for turning to sit and stand,       Balance Overall balance assessment: Needs assistance Sitting-balance support: No upper extremity supported, Feet supported Sitting balance-Leahy Scale: Good     Standing balance support: No upper extremity supported, During functional activity Standing balance-Leahy Scale: Fair Standing balance comment: able to stand from EOB and perform reaching tasks without UE support                            Cognition Arousal/Alertness: Awake/alert Behavior During Therapy: WFL for tasks assessed/performed Overall Cognitive Status: Within Functional Limits for tasks assessed  General Comments: eager to be in [place] comments (skin integrity, edema, etc.): pt wearing occlusion glasses and reports decreased  dizziness      Pertinent Vitals/Pain Pain Assessment Pain Assessment: Faces Faces Pain Scale: Hurts a little bit Pain Location: low back Pain Descriptors / Indicators: Discomfort, Grimacing Pain Intervention(s): Limited activity within patient's tolerance, Monitored during session, Premedicated before session     PT Goals (current goals can now be found in the care plan section) Acute Rehab PT Goals PT Goal Formulation: With patient Time For Goal Achievement: 09/27/22 Potential to Achieve Goals: Good Progress towards PT goals: Progressing toward goals    Frequency    Min 3X/week      PT Plan Current plan remains appropriate    Co-evaluation PT/OT/SLP Co-Evaluation/Treatment: Yes Reason for Co-Treatment: To address functional/ADL transfers (education on [facility] Program) PT goals addressed during session: Mobility/safety with mobility OT goals addressed during session: ADL's and self-care      AM-PAC PT "6 Clicks" Mobility   Outcome Measure  Help needed turning from your back to your side while in a flat bed without using bedrails?: None Help needed moving from lying on your back to sitting on the side of a flat bed without using bedrails?: A Little Help needed moving to and from a bed to a chair (including a wheelchair)?: A Little Help needed standing up from a chair using your arms (e.g., wheelchair or bedside chair)?: A Little Help needed to walk in hospital room?: A Little Help needed climbing 3-5 steps with a railing? : A Lot 6 Click Score: 18    End of Session Equipment Utilized During Treatment: Gait belt Activity Tolerance: Patient tolerated treatment well Patient left: in bed;with call bell/phone within reach;with bed alarm set Nurse Communication: Mobility status PT Visit Diagnosis: Unsteadiness on feet (R26.81);Other abnormalities of gait and mobility (R26.89);History of falling (Z91.81);Muscle weakness (generalized) (M62.81);Difficulty in walking, not  elsewhere classified (R26.2)     Time: 8110-3159 PT Time Calculation (min) (ACUTE ONLY): 35 min  Charges:  $Gait Training: 8-22 mins                     Stefanie Hodgens B. Migdalia Dk PT, DPT Acute Rehabilitation Services Please use secure chat or  Call Office [phone]    [place] 09/26/2022, 5:18 PM

## 2022-09-26 NOTE — Progress Notes (Addendum)
PROGRESS NOTE    Michael [name]  YQM:[id number] DOB: June 09, 1951 DOA: 09/09/2022 PCP: [name], MD   Brief Narrative: Michael [name] is a 71 y.o. male with a history of hypertension, chronic heart failure, paroxysmal atrial fibrillation, CAD s/p CABG/PCI, CKD, HIV, cocaine abuse. [name]. Admission complicated by significant Michael that responded to IV fluids. Discharge complicated by inability to place patient at a SNF.   Assessment and Plan: Acute kidney injury superimposed on chronic kidney disease ([place]) Creatinine of 6.32 on admission. Associated BUN of 81. Patient initially started on IV fluids with improvement of renal function towards baseline. Although In/Out show overall net positive, weight has significantly declined since admission. Creatinine up slightly -NS IV fluids overnight -Recheck metabolic panel in AM  Essential hypertension -Continue diltiazem, hydralazine, and Imdur  Paroxysmal A-fib ([name]) Patient is not on anticoagulation secondary to history of high bleeding risk -Continue diltiazem  (HFpEF) heart failure with preserved ejection fraction ([name]) Stable. Not on diuretic therapy.  HIV (human immunodeficiency virus infection) ([place]) -Continue Juluca  History of pulmonary embolism Noted. Not on anticoagulation.  Type 2 diabetes mellitus with hyperlipidemia ([name]) Hemoglobin A1C of 7.7%. Adequately controlled for age.  Polysubstance abuse ([place]) Noted. History of cocaine use.  Suicidal ideations Evaluated by psychiatry. No role for inpatient admission. -Continue trazodone and fluoxetine  COPD (chronic obstructive pulmonary disease) ([place]) Patient with some chest tightness today but no wheezing or dyspnea. -Continue Incruse Ellipta, Dulera and Duoneb PRN  CKD (chronic kidney disease), stage IV ([name]) Baseline serum creatinine appears to be around 2.2-2.4. Patient will need  nephrology follow-up as an outpatient.  Back pain Patient appears to have a history of lumbar spinal stenosis. He states a history of back surgery but is unable to tell me what was performed. He reports continued back pain but pain is mostly his chronic pain. He reports some left leg weakness and thigh pain which is maybe slightly increased from baseline. No paresthesias. No continence issues. -Lumbar spine x-ray -Norco PRN    DVT prophylaxis: Heparin subq Code Status:   Code Status: Full Code Family Communication: None at bedside Disposition Plan: Discharge pending bed availability vs improvement of functional ability to go home   Consultants:  Psychiatry Infectious disease  Procedures:    Antimicrobials:     Subjective: Patient reports that his back pain is not managed on current analgesic therapy.  Objective: BP (!) 144/82 (BP Location: Right Arm)   Pulse 69   Temp 98.4 F (36.9 C) (Oral)   Resp 16   Ht '5\' 8"'$  (1.727 m)   Wt 71.1 kg   SpO2 100%   BMI 23.83 kg/m   Examination:  General exam: Appears calm and comfortable Respiratory system: Clear to auscultation. Respiratory effort normal. Cardiovascular system: S1 & S2 heard, RRR. No murmurs, rubs, gallops or clicks. Gastrointestinal system: Abdomen is nondistended, soft and nontender. Normal bowel sounds heard. Central nervous system: Alert and oriented. 3/5 LLE strength compared to 4/5 on right. Sensation of BLE equal and intact. No hyperreflexia. Musculoskeletal: No edema. No calf tenderness Skin: No cyanosis. No rashes   Data Reviewed: I have personally reviewed following labs and imaging studies  CBC Lab Results  Component Value Date   WBC 4.8 09/17/2022   RBC 2.70 (L) 09/17/2022   HGB 8.0 (L) 09/18/2022   HCT 23.4 (L) 09/18/2022   MCV 85.9 09/17/2022   MCH 27.8 09/17/2022   PLT  104 (L) 09/17/2022   MCHC 32.3 09/17/2022   RDW 15.4 09/17/2022   LYMPHSABS 0.9 09/05/2022   MONOABS 0.5 09/05/2022    EOSABS 0.1 09/05/2022   BASOSABS 0.0 41/74/0814     Last metabolic panel Lab Results  Component Value Date   NA 141 09/26/2022   K 4.4 09/26/2022   CL 115 (H) 09/26/2022   CO2 20 (L) 09/26/2022   BUN 35 (H) 09/26/2022   CREATININE 3.13 (H) 09/26/2022   GLUCOSE 142 (H) 09/26/2022   GFRNONAA 20 (L) 09/26/2022   GFRAA 33 (L) 12/12/2019   CALCIUM 8.4 (L) 09/26/2022   PHOS 2.8 09/18/2022   PROT 7.8 09/09/2022   ALBUMIN 4.0 09/09/2022   LABGLOB 3.2 12/26/2016   AGRATIO 1.2 12/26/2016   BILITOT 0.5 09/09/2022   ALKPHOS 93 09/09/2022   AST 24 09/09/2022   ALT 27 09/09/2022   ANIONGAP 6 09/26/2022    GFR: Estimated Creatinine Clearance: 20.6 mL/min (A) (by C-G formula based on SCr of 3.13 mg/dL (H)).  No results found for this or any previous visit (from the past 240 hour(s)).    Radiology Studies: No results found.    LOS: 16 days    Cordelia Poche, MD [facility] 09/26/2022, 12:20 PM   If 7PM-7AM, please contact night-coverage [email or web address]

## 2022-09-26 NOTE — Assessment & Plan Note (Addendum)
Patient appears to have a history of lumbar spinal stenosis. He states a history of back surgery but is unable to tell me what was performed. Lumbar x-ray without acute fracture. Pain improved with Norco. -Continue Norco PRN

## 2022-09-26 NOTE — Progress Notes (Signed)
Occupational Therapy Treatment Patient Details Name: Michael [name] MRN: [id number] DOB: 07-22-1951 Today's Date: 09/26/2022   History of present illness 71 y.o. male admitted 09/09/22 with chest pain, AKI on CKD stage IIIb, Suicidal ideations in the setting of depression. Recently hospitalized 11/30-12/1/23. PMHx:HTN, chronic HFrEF with recovered LVEF 60 to 65%, PAF on Eliquis, CAD s/p CABG and stenting, DM, CKD stage IIIb, HIV on HAART, and cocaine abuse.   OT comments  STAR Program OT/PT session:  Patient seen by OT/PT for initial visit under STAR program. Patient educated on expectations for program and frequency.  Patient stated he understood and was willing to participate. Patient provided platform rollator walker to address mobility and transfer and allow for upright posture and address back pain. Patient stated decreased complaints of pain and less fatigue with use over regular walker. Patient to continue with STAR program .   Recommendations for follow up therapy are one component of a multi-disciplinary discharge planning process, led by the attending physician.  Recommendations may be updated based on patient status, additional functional criteria and insurance authorization.    Follow Up Recommendations  Skilled nursing-short term rehab (<3 hours/day)     Assistance Recommended at Discharge Frequent or constant Supervision/Assistance  Patient can return home with the following  Assist for transportation;A little help with walking and/or transfers;A little help with bathing/dressing/bathroom   Equipment Recommendations  None recommended by OT    Recommendations for Other Services      Precautions / Restrictions Precautions Precautions: Fall Restrictions Weight Bearing Restrictions: No       Mobility Bed Mobility Overal bed mobility: Modified Independent             General bed mobility comments: extra time    Transfers Overall transfer level: Needs  assistance Equipment used: Rollator (4 wheels) (platform) Transfers: Sit to/from Stand Sit to Stand: Min assist           General transfer comment: min assist with education use of brakes and turning with rollator     Balance Overall balance assessment: Needs assistance Sitting-balance support: No upper extremity supported, Feet supported Sitting balance-Leahy Scale: Good     Standing balance support: No upper extremity supported, During functional activity Standing balance-Leahy Scale: Fair Standing balance comment: able to stand from EOB and perform reaching tasks without UE support                           ADL either performed or assessed with clinical judgement   ADL Overall ADL's : Needs assistance/impaired                                       General ADL Comments: Discussed ADL goals with OT and expections with STAR program    Extremity/Trunk Assessment              Vision       Perception     Praxis      Cognition Arousal/Alertness: Awake/alert Behavior During Therapy: WFL for tasks assessed/performed Overall Cognitive Status: Within Functional Limits for tasks assessed                                 General Comments: eager to be in [name]  Shoulder Instructions       General Comments      Pertinent Vitals/ Pain       Pain Assessment Pain Assessment: Faces Faces Pain Scale: Hurts a little bit Pain Location: low back Pain Descriptors / Indicators: Discomfort, Grimacing Pain Intervention(s): Monitored during session, Repositioned  Home Living                                          Prior Functioning/Environment              Frequency  Min 5X/week        Progress Toward Goals  OT Goals(current goals can now be found in the care plan section)  Progress towards OT goals: Progressing toward goals  Acute Rehab OT Goals Patient Stated  Goal: get better OT Goal Formulation: With patient Time For Goal Achievement: 09/27/22 Potential to Achieve Goals: Good ADL Goals Pt Will Perform Grooming: with supervision;standing Pt Will Perform Lower Body Dressing: with supervision;sit to/from stand Pt Will Transfer to Toilet: with supervision;ambulating;regular height toilet Pt/caregiver will Perform Home Exercise Program: Increased strength;Both right and left upper extremity;With theraband;With Supervision  Plan Discharge plan remains appropriate;Frequency remains appropriate    Co-evaluation    PT/OT/SLP Co-Evaluation/Treatment: Yes Reason for Co-Treatment: To address functional/ADL transfers;Other (comment) (education on STAR program)   OT goals addressed during session: ADL's and self-care      AM-PAC OT "6 Clicks" Daily Activity     Outcome Measure   Help from another person eating meals?: None Help from another person taking care of personal grooming?: A Little Help from another person toileting, which includes using toliet, bedpan, or urinal?: A Little Help from another person bathing (including washing, rinsing, drying)?: A Little Help from another person to put on and taking off regular upper body clothing?: A Little Help from another person to put on and taking off regular lower body clothing?: A Little 6 Click Score: 19    End of Session Equipment Utilized During Treatment: Gait belt;Rollator (4 wheels);Other (comment) (platform rollator)  OT Visit Diagnosis: Unsteadiness on feet (R26.81);Muscle weakness (generalized) (M62.81)   Activity Tolerance Patient tolerated treatment well   Patient Left in bed;with call bell/phone within reach;with bed alarm set   Nurse Communication Mobility status        Time: 8786-7672 OT Time Calculation (min): 36 min  Charges: OT General Charges $OT Visit: 1 Visit OT Treatments $Therapeutic Activity: 8-22 mins  Lodema Hong, [place]  Office  [place] 09/26/2022, 3:54 PM

## 2022-09-27 DIAGNOSIS — I5032 Chronic diastolic (congestive) heart failure: Secondary | ICD-10-CM | POA: Diagnosis not present

## 2022-09-27 DIAGNOSIS — N179 Acute kidney failure, unspecified: Secondary | ICD-10-CM | POA: Diagnosis not present

## 2022-09-27 DIAGNOSIS — I480 Paroxysmal atrial fibrillation: Secondary | ICD-10-CM | POA: Diagnosis not present

## 2022-09-27 DIAGNOSIS — I10 Essential (primary) hypertension: Secondary | ICD-10-CM | POA: Diagnosis not present

## 2022-09-27 LAB — GLUCOSE, CAPILLARY: Glucose-Capillary: 93 mg/dL (ref 70–99)

## 2022-09-27 NOTE — Progress Notes (Signed)
Physical Therapy Treatment Patient Details Name: Michael [name] MRN: [id number] DOB: 1951-01-31 Today's Date: 09/27/2022   History of Present Illness 71 y.o. male admitted 09/09/22 with chest pain, AKI on CKD stage IIIb, Suicidal ideations in the setting of depression. Recently hospitalized 11/30-12/1/23. PMHx:HTN, chronic HFrEF with recovered LVEF 60 to 65%, PAF on Eliquis, CAD s/p CABG and stenting, DM, CKD stage IIIb, HIV on HAART, and cocaine abuse.    PT Comments    STAR PT Session: Pt to work with OT on shower. Agreeable to walking with PT in hallway prior. Pt progressing his safety with use of upright Rollator. Pt reports it is definitely easier on his back. Pt able to recall proper engagement of brakes for transfers. Pt min guard for transfers and ambulation in hallway with Rollator. Pt continues to desire discharge to drug rehab at discharge. PT will continue to assist pt to progress to that goal.     Recommendations for follow up therapy are one component of a multi-disciplinary discharge planning process, led by the attending physician.  Recommendations may be updated based on patient status, additional functional criteria and insurance authorization.  Follow Up Recommendations  Other (comment) (drug rehab) Can patient physically be transported by private vehicle: Yes   Assistance Recommended at Discharge Set up Supervision/Assistance  Patient can return home with the following A little help with walking and/or transfers;A little help with bathing/dressing/bathroom;Direct supervision/assist for medications management;Direct supervision/assist for financial management;Assist for transportation;Assistance with cooking/housework   Equipment Recommendations  Other (comment) (upright rollator)    Recommendations for Other Services Other (comment)     Precautions / Restrictions Precautions Precautions: Fall Restrictions Weight Bearing Restrictions: No     Mobility  Bed  Mobility Overal bed mobility: Modified Independent             General bed mobility comments: extra time    Transfers Overall transfer level: Needs assistance Equipment used: Rollator (4 wheels) (platform) Transfers: Sit to/from Stand Sit to Stand: Min guard           General transfer comment: min guard for safety good power up to low handles and then placing forearms in  upright cradles    Ambulation/Gait Ambulation/Gait assistance: Min assist Gait Distance (Feet): 60 Feet Assistive device: Rollator (4 wheels) (upright.) Gait Pattern/deviations: Step-through pattern, Decreased step length - right, Decreased step length - left, Narrow base of support Gait velocity: slowed Gait velocity interpretation: <1.31 ft/sec, indicative of household ambulator   General Gait Details: min guard for safety, continues to have very narrow BoS, vc for increased BoS and foot clearance         Balance Overall balance assessment: Needs assistance Sitting-balance support: No upper extremity supported, Feet supported Sitting balance-Leahy Scale: Good     Standing balance support: No upper extremity supported, During functional activity Standing balance-Leahy Scale: Fair                              Cognition Arousal/Alertness: Awake/alert Behavior During Therapy: WFL for tasks assessed/performed Overall Cognitive Status: Within Functional Limits for tasks assessed                                 General Comments: agreeable to therapy but wants to get to breakfast before it gets cold        Exercises      General Comments General  comments (skin integrity, edema, etc.): pt wearing reports dizziness with ambulation in hallway but still has dizziness with ambulation in hallway      Pertinent Vitals/Pain Pain Assessment Pain Assessment: Faces Faces Pain Scale: Hurts a little bit Pain Location: low back Pain Descriptors / Indicators: Discomfort,  Grimacing Pain Intervention(s): Limited activity within patient's tolerance, Monitored during session, Repositioned     PT Goals (current goals can now be found in the care plan section) Acute Rehab PT Goals PT Goal Formulation: With patient Time For Goal Achievement: 09/27/22 Potential to Achieve Goals: Good Progress towards PT goals: Progressing toward goals    Frequency    Min 3X/week      PT Plan Current plan remains appropriate       AM-PAC PT "6 Clicks" Mobility   Outcome Measure  Help needed turning from your back to your side while in a flat bed without using bedrails?: None Help needed moving from lying on your back to sitting on the side of a flat bed without using bedrails?: A Little Help needed moving to and from a bed to a chair (including a wheelchair)?: A Little Help needed standing up from a chair using your arms (e.g., wheelchair or bedside chair)?: A Little Help needed to walk in hospital room?: A Little Help needed climbing 3-5 steps with a railing? : A Lot 6 Click Score: 18    End of Session Equipment Utilized During Treatment: Gait belt Activity Tolerance: Patient tolerated treatment well Patient left: in bed;with call bell/phone within reach;with bed alarm set Nurse Communication: Mobility status PT Visit Diagnosis: Unsteadiness on feet (R26.81);Other abnormalities of gait and mobility (R26.89);History of falling (Z91.81);Muscle weakness (generalized) (M62.81);Difficulty in walking, not elsewhere classified (R26.2)     Time: 3428-7681 PT Time Calculation (min) (ACUTE ONLY): 17 min  Charges:  $Gait Training: 8-22 mins                     Oluwadarasimi Redmon B. Migdalia Dk PT, DPT Acute Rehabilitation Services Please use secure chat or  Call Office [phone]    [facility] 09/27/2022, 12:46 PM

## 2022-09-27 NOTE — Progress Notes (Signed)
Occupational Therapy Treatment Patient Details Name: Michael [name] MRN: [id number] DOB: [birth year] Today's Date: 09/27/2022   History of present illness 71 y.o. male admitted 09/09/22 with chest pain, AKI on CKD stage IIIb, Suicidal ideations in the setting of depression. Recently hospitalized 11/30-12/1/23. PMHx:HTN, chronic HFrEF with recovered LVEF 60 to 65%, PAF on Eliquis, CAD s/p CABG and stenting, DM, CKD stage IIIb, HIV on HAART, and cocaine abuse.   OT comments  STAR Program OT treatment:  Patient seen this am to address self care. Patient demonstrating improvement with shower transfers and standing for bathing. Patient requires min assist for donning socks due to difficulty getting over toes. Patient would benefit from further OT services to increase independence and safety with self care. Acute OT to continue to follow with STAR program.    Recommendations for follow up therapy are one component of a multi-disciplinary discharge planning process, led by the attending physician.  Recommendations may be updated based on patient status, additional functional criteria and insurance authorization.    Follow Up Recommendations  Skilled nursing-short term rehab (<3 hours/day)     Assistance Recommended at Discharge Frequent or constant Supervision/Assistance  Patient can return home with the following  Assist for transportation;A little help with walking and/or transfers;A little help with bathing/dressing/bathroom   Equipment Recommendations  None recommended by OT    Recommendations for Other Services      Precautions / Restrictions Precautions Precautions: Fall Restrictions Weight Bearing Restrictions: No       Mobility Bed Mobility Overal bed mobility: Modified Independent             General bed mobility comments: up in recliner    Transfers Overall transfer level: Needs assistance Equipment used: Rolling walker (2 wheels) Transfers: Sit to/from  Stand Sit to Stand: Min assist           General transfer comment: used RW for mobility and transfers due to going into bathroom for shower     Balance Overall balance assessment: Needs assistance Sitting-balance support: No upper extremity supported, Feet supported Sitting balance-Leahy Scale: Good     Standing balance support: No upper extremity supported, During functional activity Standing balance-Leahy Scale: Fair Standing balance comment: able to stand in shower unsupported for peri area bathing                           ADL either performed or assessed with clinical judgement   ADL Overall ADL's : Needs assistance/impaired     Grooming: Applying deodorant;Set up;Sitting   Upper Body Bathing: Set up;Sitting Upper Body Bathing Details (indicate cue type and reason): in shower Lower Body Bathing: Min guard;Sit to/from stand Lower Body Bathing Details (indicate cue type and reason): min guard when standing in shower for safety Upper Body Dressing : Set up;Sitting Upper Body Dressing Details (indicate cue type and reason): donned paper scrub top Lower Body Dressing: Minimal assistance;Sit to/from stand Lower Body Dressing Details (indicate cue type and reason): assistance with socks and min guard when standing to pull up clothing         Tub/ Shower Transfer: Min guard;Walk-in shower;BSC/3in1;Grab bars Tub/Shower Transfer Details (indicate cue type and reason): verbal cues for rail use and safety Functional mobility during ADLs: Min guard;Rolling walker (2 wheels) General ADL Comments: performed bathing with shower and min guard for safety when standing    Extremity/Trunk Assessment  Vision       Perception     Praxis      Cognition Arousal/Alertness: Awake/alert Behavior During Therapy: WFL for tasks assessed/performed Overall Cognitive Status: Within Functional Limits for tasks assessed                                  General Comments: very pleasant and cooperative        Exercises      Shoulder Instructions       General Comments      Pertinent Vitals/ Pain       Pain Assessment Pain Assessment: Faces Faces Pain Scale: Hurts a little bit Pain Location: low back Pain Descriptors / Indicators: Discomfort, Grimacing Pain Intervention(s): Monitored during session, Patient requesting pain meds-RN notified, Repositioned, Heat applied  Home Living                                          Prior Functioning/Environment              Frequency  Min 5X/week        Progress Toward Goals  OT Goals(current goals can now be found in the care plan section)  Progress towards OT goals: Progressing toward goals  Acute Rehab OT Goals Patient Stated Goal: get better OT Goal Formulation: With patient Time For Goal Achievement: 09/27/22 Potential to Achieve Goals: Good ADL Goals Pt Will Perform Grooming: with supervision;standing Pt Will Perform Lower Body Dressing: with supervision;sit to/from stand Pt Will Transfer to Toilet: with supervision;ambulating;regular height toilet Pt/caregiver will Perform Home Exercise Program: Increased strength;Both right and left upper extremity;With theraband;With Supervision  Plan Discharge plan remains appropriate;Frequency remains appropriate    Co-evaluation                 AM-PAC OT "6 Clicks" Daily Activity     Outcome Measure   Help from another person eating meals?: None Help from another person taking care of personal grooming?: A Little Help from another person toileting, which includes using toliet, bedpan, or urinal?: A Little Help from another person bathing (including washing, rinsing, drying)?: A Little Help from another person to put on and taking off regular upper body clothing?: A Little Help from another person to put on and taking off regular lower body clothing?: A Little 6 Click Score: 19    End  of Session Equipment Utilized During Treatment: Rolling walker (2 wheels)  OT Visit Diagnosis: Unsteadiness on feet (R26.81);Muscle weakness (generalized) (M62.81)   Activity Tolerance Patient tolerated treatment well   Patient Left in chair;with call bell/phone within reach;with chair alarm set   Nurse Communication Mobility status;Patient requests pain meds        Time: 2440-1027 OT Time Calculation (min): 28 min  Charges: OT General Charges $OT Visit: 1 Visit OT Treatments $Self Care/Home Management : 23-37 mins  Lodema Hong, [place]  Office [phone]   Trixie Dredge 09/27/2022, 10:17 AM

## 2022-09-27 NOTE — Progress Notes (Signed)
Mobility Specialist Progress Note:   09/27/22 1120  Mobility  Activity Ambulated with assistance in hallway  Level of Assistance Contact guard assist, steadying assist  Assistive Device Four wheel walker (up right)  Distance Ambulated (ft) 115 ft  Activity Response Tolerated well  Mobility Referral Yes  $Mobility charge 1 Mobility   Pt eager for mobility session. Required no physical assistance, only minG for safety. C/o minor SOB and back pain throughout (improves using the upright rollator instead of RW). Pt back in bed with all needs met.   Nelta Numbers Mobility Specialist Please contact via SecureChat or  Rehab office at [phone]

## 2022-09-27 NOTE — Progress Notes (Signed)
Progress Note   Patient: Michael [name] IRW:[id number] DOB: 09/08/1951 DOA: 09/09/2022     17 DOS: the patient was seen and examined on 09/27/2022   Brief hospital course: Mr. Partch was admitted to the hospital with the working diagnosis of acute renal failure on chronic renal disease.    71 yo male with the past medical history of hypertension, heart failure, paroxysmal atrial fibrillation, coronary artery disease, HIV, T2DM, cocaine use and CKD stage 3b who presented with chest pain. Recent hospitalization for chest pain 11/30 to 09/06/22, with was placed on medical therapy including aspirin, clopidogrel, atorvastatin and isosorbide. At home he had recurrent chest pain after using crack cocaine. He had left sided chest pain, associated with nausea. Patient homeless and having thoughts of ending his life (no specific plan). On his initial physical examination his blood pressure was 179/103, HR 87, RR 20 and 02 saturation 100%, lungs with no wheezing or rales, heart with S1 and S2 present and rhythmic, abdomen with no distention, positive right costovertebral tenderness, no lower extremity edema.    Na 135, K 5,1 CL 107 bicarbonate 15, glucose 123, bun 81, cr 6,32  Wbc 7,8 hgb 9,6 plt 155  Toxicology positive for cocaine.  Urine analysis SG 1,013, 6-10 rbc, 100 protein, negative leukocytes.    Chest radiograph with no cardiomegaly, no infiltrates, sternotomy wires and left atrial clip.    EKG 85 bpm, normal axis, normal intervals, sinus rhythm with poor R R wave progression, no significant ST segment or T wave changes.    Patient was placed on IV fluids with improvement in renal function.  US renal with no obstructive uropathy.    Psychiatry was consulted with recommendations to continue medical therapy. No indication for inpatient psych admission.    PT and OT have recommended patient to be transfer to SNF to contin with physical therapy.    12/14 Patient pending to be transfer to  SNF.  12/15 progressive care meeting, patient not able to transfer to SNF, will plan to discharge home when physically more capable.  12/22 Patient continue waiting to be more reconditioned to go home.   Assessment and Plan: Acute kidney injury superimposed on chronic kidney disease ([name]) CKD stage 4.   Creatinine of 6.32 on admission. Associated BUN of 81. Patient initially started on IV fluids with improvement of renal function towards baseline.   Renal function with serum cr at 3,1 with K at 4.4 and serum bicarbonate at 20.   Essential hypertension -Continue diltiazem, hydralazine, and Imdur  Paroxysmal A-fib ([place]) Patient is not on anticoagulation secondary to history of high bleeding risk -Continue diltiazem  (HFpEF) heart failure with preserved ejection fraction ([name]) Stable. Not on diuretic therapy.  HIV (human immunodeficiency virus infection) ([place]) -Continue Juluca  History of pulmonary embolism Noted. Not on anticoagulation.  Type 2 diabetes mellitus with hyperlipidemia ([name]) Hemoglobin A1C of 7.7%. Adequately controlled for age.  Polysubstance abuse ([place]) Noted. History of cocaine use.  Suicidal ideations Evaluated by psychiatry. No role for inpatient admission. -Continue trazodone and fluoxetine  COPD (chronic obstructive pulmonary disease) ([place]) Patient with some chest tightness today but no wheezing or dyspnea. -Continue Incruse Ellipta, Dulera and Duoneb PRN  Back pain Patient appears to have a history of lumbar spinal stenosis. He states a history of back surgery but is unable to tell me what was performed. He reports continued back pain but pain is mostly his chronic pain. He reports some left leg weakness and thigh pain which  is maybe slightly increased from baseline. No paresthesias. No continence issues. -Lumbar spine x-ray -Norco PRN  CKD (chronic kidney disease), stage IV ([name])-resolved as of 09/27/2022 Baseline serum creatinine appears to be  around 2.2-2.4. Patient will need nephrology follow-up as an outpatient.        Subjective: patient with no chest pain or dyspnea. Back pain has improved.   Physical Exam: Vitals:   09/26/22 1608 09/26/22 2028 09/27/22 0320 09/27/22 0904  BP: 132/74 134/74 131/76 (!) 157/77  Pulse: 66 70 67 67  Resp: '17 18 18   '$ Temp: 98 F (36.7 C) 98 F (36.7 C) 98.5 F (36.9 C) 97.8 F (36.6 C)  TempSrc: Oral Oral Oral Oral  SpO2: 100% 100% 99% 100%  Weight:      Height:       Neurology awake and alert ENT with mild pallor Cardiovascular with S1 and S2 present and rhythmic Respiratory with no rales or wheezing Abdomen with no distention No lower extremity edema  Data Reviewed:    Family Communication: no family at the bedside   Disposition: Status is: Inpatient Remains inpatient appropriate because: pending placement   Planned Discharge Destination: Home    Author: Tawni Millers, MD 09/27/2022 2:11 PM  For on call review [email or web address].

## 2022-09-27 NOTE — Plan of Care (Signed)
  Problem: Activity: Goal: Ability to tolerate increased activity will improve Outcome: Progressing   Problem: Metabolic: Goal: Ability to maintain appropriate glucose levels will improve Outcome: Progressing   Problem: Nutritional: Goal: Maintenance of adequate nutrition will improve Outcome: Progressing   Problem: Clinical Measurements: Goal: Will remain free from infection Outcome: Progressing   Problem: Clinical Measurements: Goal: Diagnostic test results will improve Outcome: Progressing

## 2022-09-27 NOTE — Progress Notes (Signed)
Mobility Specialist Progress Note:   09/26/22 1200  Mobility  Activity Ambulated with assistance in hallway  Level of Assistance Contact guard assist, steadying assist  Assistive Device Front wheel walker  Distance Ambulated (ft) 115 ft  Activity Response Tolerated well  Mobility Referral Yes  $Mobility charge 1 Mobility   Pt eager for mobility session. Required only min guard for ambulation. Pt c/o lower back pain during session. Back in bed with all needs met.   Nelta Numbers Mobility Specialist Please contact via SecureChat or  Rehab office at [phone]

## 2022-09-28 DIAGNOSIS — I480 Paroxysmal atrial fibrillation: Secondary | ICD-10-CM | POA: Diagnosis not present

## 2022-09-28 DIAGNOSIS — I5032 Chronic diastolic (congestive) heart failure: Secondary | ICD-10-CM | POA: Diagnosis not present

## 2022-09-28 DIAGNOSIS — I10 Essential (primary) hypertension: Secondary | ICD-10-CM | POA: Diagnosis not present

## 2022-09-28 DIAGNOSIS — N179 Acute kidney failure, unspecified: Secondary | ICD-10-CM | POA: Diagnosis not present

## 2022-09-28 NOTE — Progress Notes (Signed)
Mobility Specialist Progress Note:   09/28/22 0945  Mobility  Activity Ambulated with assistance in hallway  Level of Assistance Contact guard assist, steadying assist  Assistive Device Four wheel walker (upright)  Distance Ambulated (ft) 120 ft  Activity Response Tolerated well  Mobility Referral Yes  $Mobility charge 1 Mobility   Pt eager for mobility session. Required only minG for safety. Pt with no c/o back pain or dizziness throughout. Left in chair with all needs met.   Nelta Numbers Mobility Specialist Please contact via SecureChat or  Rehab office at [phone]

## 2022-09-28 NOTE — Progress Notes (Signed)
Mobility Specialist Progress Note:   09/28/22 1515  Mobility  Activity Ambulated with assistance in hallway  Level of Assistance Contact guard assist, steadying assist  Assistive Device Four wheel walker (upright)  Distance Ambulated (ft) 120 ft  Activity Response Tolerated well  Mobility Referral Yes  $Mobility charge 1 Mobility   Pt eager for second mobility session. Required contact guard assist throughout session. No c/o back pain or dizziness throughout. Back in bed with all needs met.   Nelta Numbers Mobility Specialist Please contact via SecureChat or  Rehab office at [phone]

## 2022-09-28 NOTE — Progress Notes (Signed)
Progress Note   Patient: Eliazar Olivar LKG:[id number] DOB: 1950-11-13 DOA: 09/09/2022     18 DOS: the patient was seen and examined on 09/28/2022   Brief hospital course: Mr. Tuccillo was admitted to the hospital with the working diagnosis of acute renal failure on chronic renal disease.    71 yo male with the past medical history of hypertension, heart failure, paroxysmal atrial fibrillation, coronary artery disease, HIV, T2DM, cocaine use and CKD stage 3b who presented with chest pain. Recent hospitalization for chest pain 11/30 to 09/06/22, with was placed on medical therapy including aspirin, clopidogrel, atorvastatin and isosorbide. At home he had recurrent chest pain after using crack cocaine. He had left sided chest pain, associated with nausea. Patient homeless and having thoughts of ending his life (no specific plan). On his initial physical examination his blood pressure was 179/103, HR 87, RR 20 and 02 saturation 100%, lungs with no wheezing or rales, heart with S1 and S2 present and rhythmic, abdomen with no distention, positive right costovertebral tenderness, no lower extremity edema.    Na 135, K 5,1 CL 107 bicarbonate 15, glucose 123, bun 81, cr 6,32  Wbc 7,8 hgb 9,6 plt 155  Toxicology positive for cocaine.  Urine analysis SG 1,013, 6-10 rbc, 100 protein, negative leukocytes.    Chest radiograph with no cardiomegaly, no infiltrates, sternotomy wires and left atrial clip.    EKG 85 bpm, normal axis, normal intervals, sinus rhythm with poor R R wave progression, no significant ST segment or T wave changes.    Patient was placed on IV fluids with improvement in renal function.  US renal with no obstructive uropathy.    Psychiatry was consulted with recommendations to continue medical therapy. No indication for inpatient psych admission.    PT and OT have recommended patient to be transfer to SNF to contin with physical therapy.    12/14 Patient pending to be transfer to  SNF.  12/15 progressive care meeting, patient not able to transfer to SNF, will plan to discharge home when physically more capable.  12/22 Patient continue waiting to be more reconditioned to go home.   Assessment and Plan: Acute kidney injury superimposed on chronic kidney disease ([name]) CKD stage 4.   Creatinine of 6.32 on admission. Associated BUN of 81. Patient initially started on IV fluids with improvement of renal function towards baseline.   Follow up renal function in am.   Essential hypertension -Continue diltiazem, hydralazine, and Imdur  Paroxysmal A-fib ([place]) Patient is not on anticoagulation secondary to history of high bleeding risk -Continue diltiazem  (HFpEF) heart failure with preserved ejection fraction ([name]) Stable. Not on diuretic therapy.  HIV (human immunodeficiency virus infection) ([place]) -Continue Juluca  History of pulmonary embolism Noted. Not on anticoagulation.  Type 2 diabetes mellitus with hyperlipidemia ([name]) Glucose has been controlled with capillary 99, 143 and 93.  Continue statin therapy.   Polysubstance abuse ([place]) Noted. History of cocaine use.  Suicidal ideations Evaluated by psychiatry. No role for inpatient admission. -Continue trazodone and fluoxetine  COPD (chronic obstructive pulmonary disease) ([name]) Continue Incruse Ellipta, Dulera and Duoneb PRN  Back pain Patient appears to have a history of lumbar spinal stenosis. Spine radiographs with no acute fractures.  His back pain has improved with analgesics.   CKD (chronic kidney disease), stage IV ([name])-resolved as of 09/27/2022 Baseline serum creatinine appears to be around 2.2-2.4. Patient will need nephrology follow-up as an outpatient.        Subjective: Patient is  feeling better, back pain is controlled, no further constipation   Physical Exam: Vitals:   09/27/22 2059 09/28/22 0513 09/28/22 0758 09/28/22 0839  BP: (!) 150/83 (!) 144/90  130/78  Pulse: 70 65  70   Resp: 16 18    Temp: 98.4 F (36.9 C) 98.3 F (36.8 C)  97.7 F (36.5 C)  TempSrc: Oral Oral  Oral  SpO2: 100% 100% 100% 100%  Weight:      Height:       Neurology awake and alert ENT with no pallor Cardiovascular with S1 and S2 present and rhythmic Respiratory with no rales or wheezing Abdomen with no distention  No lower extremity edema  Data Reviewed:    Family Communication: no family at the bedside   Disposition: Status is: Inpatient Remains inpatient appropriate because: pending placement   Planned Discharge Destination: Home     Author: Tawni Millers, MD 09/28/2022 12:57 PM  For on call review [email or web address].

## 2022-09-29 DIAGNOSIS — I480 Paroxysmal atrial fibrillation: Secondary | ICD-10-CM | POA: Diagnosis not present

## 2022-09-29 DIAGNOSIS — I5032 Chronic diastolic (congestive) heart failure: Secondary | ICD-10-CM | POA: Diagnosis not present

## 2022-09-29 DIAGNOSIS — I10 Essential (primary) hypertension: Secondary | ICD-10-CM | POA: Diagnosis not present

## 2022-09-29 DIAGNOSIS — N179 Acute kidney failure, unspecified: Secondary | ICD-10-CM | POA: Diagnosis not present

## 2022-09-29 LAB — GLUCOSE, CAPILLARY: Glucose-Capillary: 158 mg/dL — ABNORMAL HIGH (ref 70–99)

## 2022-09-29 LAB — BASIC METABOLIC PANEL
Anion gap: 8 (ref 5–15)
BUN: 37 mg/dL — ABNORMAL HIGH (ref 8–23)
CO2: 19 mmol/L — ABNORMAL LOW (ref 22–32)
Calcium: 8.9 mg/dL (ref 8.9–10.3)
Chloride: 113 mmol/L — ABNORMAL HIGH (ref 98–111)
Creatinine, Ser: 3.35 mg/dL — ABNORMAL HIGH (ref 0.61–1.24)
GFR, Estimated: 19 mL/min — ABNORMAL LOW (ref >60)
Glucose, Bld: 99 mg/dL (ref 70–99)
Potassium: 4.9 mmol/L (ref 3.5–5.1)
Sodium: 140 mmol/L (ref 135–145)

## 2022-09-29 MED ORDER — SODIUM CHLORIDE 0.45 % IV BOLUS
500.0000 mL | Freq: Once | INTRAVENOUS | Status: AC
  Administered 2022-09-29: 500 mL via INTRAVENOUS

## 2022-09-29 NOTE — Plan of Care (Signed)
  Problem: Activity: Goal: Ability to tolerate increased activity will improve Outcome: Progressing   Problem: Coping: Goal: Ability to adjust to condition or change in health will improve Outcome: Progressing   Problem: Fluid Volume: Goal: Ability to maintain a balanced intake and output will improve Outcome: Progressing

## 2022-09-29 NOTE — Progress Notes (Signed)
Mobility Specialist Progress Note:   09/29/22 1500  Mobility  Activity Ambulated with assistance in hallway  Level of Assistance Contact guard assist, steadying assist  Assistive Device Four wheel walker (upright)  Distance Ambulated (ft) 120 ft  Activity Response Tolerated well  Mobility Referral Yes  $Mobility charge 1 Mobility   Pt eager for second mobility session. Required no physical assist, only minG for safety. Pt with no dizziness while wearing occlusion glasses. Left in bed with all needs met, bed alarm on.  Nelta Numbers Mobility Specialist Please contact via SecureChat or  Rehab office at [phone]

## 2022-09-29 NOTE — Progress Notes (Addendum)
Progress Note   Patient: Michael [name] CWC:[id number] DOB: 06-27-1951 DOA: 09/09/2022     19 DOS: the patient was seen and examined on 09/29/2022   Brief hospital course: Michael [name] was admitted to the hospital with the working diagnosis of acute renal failure on chronic renal disease.    71 yo male with the past medical history of hypertension, heart failure, paroxysmal atrial fibrillation, coronary artery disease, HIV, T2DM, cocaine use and CKD stage 3b who presented with chest pain. Recent hospitalization for chest pain 11/30 to 09/06/22, with was placed on medical therapy including aspirin, clopidogrel, atorvastatin and isosorbide. At home he had recurrent chest pain after using crack cocaine. He had left sided chest pain, associated with nausea. Patient homeless and having thoughts of ending his life (no specific plan). On his initial physical examination his blood pressure was 179/103, HR 87, RR 20 and 02 saturation 100%, lungs with no wheezing or rales, heart with S1 and S2 present and rhythmic, abdomen with no distention, positive right costovertebral tenderness, no lower extremity edema.    Na 135, K 5,1 CL 107 bicarbonate 15, glucose 123, bun 81, cr 6,32  Wbc 7,8 hgb 9,6 plt 155  Toxicology positive for cocaine.  Urine analysis SG 1,013, 6-10 rbc, 100 protein, negative leukocytes.    Chest radiograph with no cardiomegaly, no infiltrates, sternotomy wires and left atrial clip.    EKG 85 bpm, normal axis, normal intervals, sinus rhythm with poor R R wave progression, no significant ST segment or T wave changes.    Patient was placed on IV fluids with improvement in renal function.  US renal with no obstructive uropathy.    Psychiatry was consulted with recommendations to continue medical therapy. No indication for inpatient psych admission.    PT and OT have recommended patient to be transfer to SNF to contin with physical therapy.    12/14 Patient pending to be transfer to  SNF.  12/15 progressive care meeting, patient not able to transfer to SNF, will plan to discharge home when physically more capable.  12/22 Patient continue waiting to be more reconditioned to go home.   Assessment and Plan: Acute kidney injury superimposed on chronic kidney disease ([name]) CKD stage 4.   Creatinine of 6.32 on admission. Associated BUN of 81. Patient initially started on IV fluids with improvement of renal function towards baseline.   Serum cr is 3,35 with K at 4,9 and serum bicarbonate at 19. Na 140 and Cl 113.  Will give one bolus 500 cc 0,45 % saline one dose and follow up renal function in am.  Avoid nephrotoxic medications.   Essential hypertension -Continue diltiazem, hydralazine, and Imdur  Paroxysmal A-fib ([place]) Patient is not on anticoagulation secondary to history of high bleeding risk -Continue diltiazem  (HFpEF) heart failure with preserved ejection fraction ([name]) Stable. Not on diuretic therapy.  HIV (human immunodeficiency virus infection) ([place]) -Continue Juluca  History of pulmonary embolism Noted. Not on anticoagulation.  Type 2 diabetes mellitus with hyperlipidemia ([name]) Glucose has been well controlled, patient is off insulin therapy.  Continue statin therapy.   Polysubstance abuse ([place]) Noted. History of cocaine use.  Suicidal ideations Evaluated by psychiatry. No role for inpatient admission. -Continue trazodone and fluoxetine  COPD (chronic obstructive pulmonary disease) ([name]) Continue Incruse Ellipta, Dulera and Duoneb PRN  Back pain Patient appears to have a history of lumbar spinal stenosis. Spine radiographs with no acute fractures.  His back pain has improved with analgesics.   CKD (  chronic kidney disease), stage IV ([name])-resolved as of 09/27/2022 Baseline serum creatinine appears to be around 2.2-2.4. Patient will need nephrology follow-up as an outpatient.        Subjective: Patient feels that is getting a little  more strong with time, today he is constipated again, no nausea or vomiting, no abdominal pain, he is in the chair out of bed.   Physical Exam: Vitals:   09/28/22 1622 09/28/22 2124 09/29/22 0553 09/29/22 0918  BP: (!) 144/78 (!) 154/77 (!) 150/79 (!) 147/83  Pulse: 84 70 67 75  Resp: '18 17 18   '$ Temp: 97.9 F (36.6 C) 98.4 F (36.9 C) 98.1 F (36.7 C) 98 F (36.7 C)  TempSrc: Oral Oral Oral Oral  SpO2: 98% 100%  100%  Weight:  76 kg    Height:       Neurology awake and alert ENT with mild pallor Cardiovascular with S1 and S2 present and rhythmic Respiratory with no rales or wheezing Abdomen with no distention  No lower extremity edema  Data Reviewed:    Family Communication: no family at the bedside   Disposition: Status is: Inpatient Remains inpatient appropriate because: pending placement   Planned Discharge Destination: Home   Author: Tawni Millers, MD 09/29/2022 12:52 PM  For on call review [email or web address].

## 2022-09-29 NOTE — Progress Notes (Signed)
Mobility Specialist Progress Note:   09/29/22 1135  Mobility  Activity Ambulated with assistance in hallway  Level of Assistance Contact guard assist, steadying assist  Assistive Device Four wheel walker (Upright)  Distance Ambulated (ft) 120 ft  Activity Response Tolerated well  Mobility Referral Yes  $Mobility charge 1 Mobility   Pt eager for mobility session. Required minG for safety throughout. No c/o dizziness or back pain. Pt left sitting up in chair with all needs met.     Mobility Specialist Please contact via SecureChat or  Rehab office at [phone]  

## 2022-09-30 DIAGNOSIS — N179 Acute kidney failure, unspecified: Secondary | ICD-10-CM | POA: Diagnosis not present

## 2022-09-30 DIAGNOSIS — I10 Essential (primary) hypertension: Secondary | ICD-10-CM | POA: Diagnosis not present

## 2022-09-30 DIAGNOSIS — I5032 Chronic diastolic (congestive) heart failure: Secondary | ICD-10-CM | POA: Diagnosis not present

## 2022-09-30 DIAGNOSIS — I480 Paroxysmal atrial fibrillation: Secondary | ICD-10-CM | POA: Diagnosis not present

## 2022-09-30 LAB — BASIC METABOLIC PANEL
Anion gap: 6 (ref 5–15)
BUN: 41 mg/dL — ABNORMAL HIGH (ref 8–23)
CO2: 20 mmol/L — ABNORMAL LOW (ref 22–32)
Calcium: 8.3 mg/dL — ABNORMAL LOW (ref 8.9–10.3)
Chloride: 112 mmol/L — ABNORMAL HIGH (ref 98–111)
Creatinine, Ser: 3.38 mg/dL — ABNORMAL HIGH (ref 0.61–1.24)
GFR, Estimated: 19 mL/min — ABNORMAL LOW (ref >60)
Glucose, Bld: 120 mg/dL — ABNORMAL HIGH (ref 70–99)
Potassium: 4.7 mmol/L (ref 3.5–5.1)
Sodium: 138 mmol/L (ref 135–145)

## 2022-09-30 MED ORDER — POLYETHYLENE GLYCOL 3350 17 G PO PACK
17.0000 g | PACK | Freq: Two times a day (BID) | ORAL | Status: DC
  Administered 2022-09-30 – 2022-10-15 (×6): 17 g via ORAL
  Filled 2022-09-30 (×22): qty 1

## 2022-09-30 MED ORDER — SODIUM CHLORIDE 0.45 % IV SOLN: INTRAVENOUS | Status: DC

## 2022-09-30 NOTE — Plan of Care (Signed)
  Problem: Activity: Goal: Ability to tolerate increased activity will improve Outcome: Progressing   Problem: Cardiac: Goal: Ability to achieve and maintain adequate cardiovascular perfusion will improve Outcome: Progressing   Problem: Nutritional: Goal: Maintenance of adequate nutrition will improve Outcome: Progressing

## 2022-09-30 NOTE — Progress Notes (Signed)
Progress Note   Patient: Michael [name] QPR:[id number] DOB: [birth year] DOA: 09/09/2022     20 DOS: the patient was seen and examined on 09/30/2022   Brief hospital course: Michael [name] was admitted to the hospital with the working diagnosis of acute renal failure on chronic renal disease.    71 yo male with the past medical history of hypertension, heart failure, paroxysmal atrial fibrillation, coronary artery disease, HIV, T2DM, cocaine use and CKD stage 3b who presented with chest pain. Recent hospitalization for chest pain 11/30 to 09/06/22, with was placed on medical therapy including aspirin, clopidogrel, atorvastatin and isosorbide. At home he had recurrent chest pain after using crack cocaine. He had left sided chest pain, associated with nausea. Patient homeless and having thoughts of ending his life (no specific plan). On his initial physical examination his blood pressure was 179/103, HR 87, RR 20 and 02 saturation 100%, lungs with no wheezing or rales, heart with S1 and S2 present and rhythmic, abdomen with no distention, positive right costovertebral tenderness, no lower extremity edema.    Na 135, K 5,1 CL 107 bicarbonate 15, glucose 123, bun 81, cr 6,32  Wbc 7,8 hgb 9,6 plt 155  Toxicology positive for cocaine.  Urine analysis SG 1,013, 6-10 rbc, 100 protein, negative leukocytes.    Chest radiograph with no cardiomegaly, no infiltrates, sternotomy wires and left atrial clip.    EKG 85 bpm, normal axis, normal intervals, sinus rhythm with poor R R wave progression, no significant ST segment or T wave changes.    Patient was placed on IV fluids with improvement in renal function.  US renal with no obstructive uropathy.    Psychiatry was consulted with recommendations to continue medical therapy. No indication for inpatient psych admission.    PT and OT have recommended patient to be transfer to SNF to contin with physical therapy.    12/14 Patient pending to be transfer to  SNF.  12/15 progressive care meeting, patient not able to transfer to SNF, will plan to discharge home when physically more capable.  12/22 Patient continue waiting to be more reconditioned to go home.   Assessment and Plan: Acute kidney injury superimposed on chronic kidney disease ([name]) CKD stage 4.   Creatinine of 6.32 on admission. Associated BUN of 81. Patient initially started on IV fluids with improvement of renal function towards baseline.   Renal function with serum cr at 3,38 with K at 4,7 and serum bicarbonate at 20. Na 138 and Cl 112.   He continue with poor oral intake. He had a bolus IV fluids yesterday of 500 cc Will add 0,45 saline at 75 ml per hr and follow up renal function in am.   Avoid nephrotoxic medications.   Essential hypertension -Continue diltiazem, hydralazine, and Imdur  Paroxysmal A-fib ([place]) Patient is not on anticoagulation secondary to history of high bleeding risk -Continue diltiazem  (HFpEF) heart failure with preserved ejection fraction ([name]) Stable. Not on diuretic therapy.  HIV (human immunodeficiency virus infection) ([place]) -Continue Juluca  History of pulmonary embolism Noted. Not on anticoagulation.  Type 2 diabetes mellitus with hyperlipidemia ([name]) Glucose has been well controlled, patient is off insulin therapy.  Continue statin therapy.   Polysubstance abuse ([place]) Noted. History of cocaine use.  Suicidal ideations Evaluated by psychiatry. No role for inpatient admission. -Continue trazodone and fluoxetine  COPD (chronic obstructive pulmonary disease) ([name]) Continue Incruse Ellipta, Dulera and Duoneb PRN  Back pain Patient appears to have a history of lumbar  spinal stenosis. Spine radiographs with no acute fractures.  His back pain has improved with analgesics.   CKD (chronic kidney disease), stage IV ([name])-resolved as of 09/27/2022 Baseline serum creatinine appears to be around 2.2-2.4. Patient will need nephrology  follow-up as an outpatient.        Subjective: patient with poor oral intake, continue to have constipation   Physical Exam: Vitals:   09/29/22 2127 09/29/22 2138 09/30/22 0454 09/30/22 0912  BP:  138/87 (!) 126/59 (!) 140/80  Pulse:  66 64 78  Resp:  '18 18 20  '$ Temp:  98.2 F (36.8 C) 98.1 F (36.7 C) 98.8 F (37.1 C)  TempSrc:  Oral Oral Oral  SpO2: 100% 100% 100% 100%  Weight:      Height:       Neurology awake and alert ENT with mild pallor Cardiovascular with S1 and S2 present and rhythmic Respiratory with no rales or wheezing Abdomen with no distention  Data Reviewed:    Family Communication: no family at the bedside   Disposition: Status is: Inpatient Remains inpatient appropriate because: pending placement   Planned Discharge Destination: Home     Author: Tawni Millers, MD 09/30/2022 1:59 PM  For on call review [email or web address].

## 2022-09-30 NOTE — Progress Notes (Signed)
Mobility Specialist Progress Note:   09/30/22 0932  Mobility  Activity Ambulated with assistance in hallway  Level of Assistance Contact guard assist, steadying assist  Assistive Device Front wheel walker (Upright)  Distance Ambulated (ft) 120 ft  Activity Response Tolerated well  Mobility Referral Yes  $Mobility charge 1 Mobility   Pt received in bed and agreeable. No complaints of dizziness. Pt left in chair with all needs met and call bell in reach.   Andrey Campanile Mobility Specialist Please contact via SecureChat or  Rehab office at [phone]

## 2022-10-01 DIAGNOSIS — R45851 Suicidal ideations: Secondary | ICD-10-CM | POA: Diagnosis not present

## 2022-10-01 DIAGNOSIS — N179 Acute kidney failure, unspecified: Secondary | ICD-10-CM | POA: Diagnosis not present

## 2022-10-01 DIAGNOSIS — E1169 Type 2 diabetes mellitus with other specified complication: Secondary | ICD-10-CM | POA: Diagnosis not present

## 2022-10-01 DIAGNOSIS — Z8679 Personal history of other diseases of the circulatory system: Secondary | ICD-10-CM

## 2022-10-01 DIAGNOSIS — F1910 Other psychoactive substance abuse, uncomplicated: Secondary | ICD-10-CM | POA: Diagnosis not present

## 2022-10-01 LAB — BASIC METABOLIC PANEL
Anion gap: 6 (ref 5–15)
BUN: 39 mg/dL — ABNORMAL HIGH (ref 8–23)
CO2: 19 mmol/L — ABNORMAL LOW (ref 22–32)
Calcium: 8.4 mg/dL — ABNORMAL LOW (ref 8.9–10.3)
Chloride: 117 mmol/L — ABNORMAL HIGH (ref 98–111)
Creatinine, Ser: 3.37 mg/dL — ABNORMAL HIGH (ref 0.61–1.24)
GFR, Estimated: 19 mL/min — ABNORMAL LOW (ref >60)
Glucose, Bld: 93 mg/dL (ref 70–99)
Potassium: 5 mmol/L (ref 3.5–5.1)
Sodium: 142 mmol/L (ref 135–145)

## 2022-10-01 MED ORDER — SENNA 8.6 MG PO TABS: 1.0000 | ORAL_TABLET | Freq: Every day | ORAL | Status: DC

## 2022-10-01 MED ORDER — SENNA 8.6 MG PO TABS
1.0000 | ORAL_TABLET | Freq: Two times a day (BID) | ORAL | Status: DC
  Administered 2022-10-01 – 2022-10-18 (×34): 8.6 mg via ORAL
  Filled 2022-10-01 (×35): qty 1

## 2022-10-01 NOTE — Progress Notes (Signed)
Mobility Specialist Progress Note:    10/01/22 1400  Mobility  Activity Ambulated with assistance in hallway  Level of Assistance Contact guard assist, steadying assist  Assistive Device Front wheel walker (Upright)  Distance Ambulated (ft) 120 ft  Activity Response Tolerated well  Mobility Referral Yes  $Mobility charge 1 Mobility   Pt was eager for mobility session. Tolerated mobility well. C/o SOB near EOS. Left patient in chair with all needs met.    Royetta Crochet Mobility Specialist Please contact via [employer] or  Rehab office at [phone]

## 2022-10-01 NOTE — Progress Notes (Signed)
PROGRESS NOTE  Michael [name] XIP:[id number] DOB: [birth year]   PCP: [name], MD   DOA: 09/09/2022 LOS: 73  Chief complaints Chief Complaint  Patient presents with   Suicidal     Brief Narrative / Interim history: 71 year old M with PMH of HTN, diastolic CHF, paroxysmal A-fib, CAD, HIV, DM-2 and CKD-4 brought to ED by EMS with suicidal ideation (without specific plan) and recurrent chest pain after using cocaine at home.  UDS positive for cocaine. AKI improved with IV fluid hydration.  Renal ultrasound without obstructive etiology.  Chest pain workup unrevealing.  Therapy recommended SNF but not able to place.  Plan to discharge home when physically more capable.   Subjective: Seen and examined earlier this morning.  No major events overnight of this morning.  Complains constipation.  He states his last bowel movement was 4 days ago.  He has not been using MiraLAX consistently.  Objective: Vitals:   09/30/22 1657 09/30/22 2057 10/01/22 0528 10/01/22 0951  BP: 132/73 133/75 (!) 157/85 (!) 158/75  Pulse: 80 74 69 70  Resp: '18 18 18 17  '$ Temp: 98.2 F (36.8 C) 98.4 F (36.9 C) 98.5 F (36.9 C) 97.9 F (36.6 C)  TempSrc: Oral Oral Oral Oral  SpO2: 100% 98% 100% 100%  Weight:      Height:        Examination:  GENERAL: No apparent distress.  Nontoxic. HEENT: MMM.  Vision and hearing grossly intact.  NECK: Supple.  No apparent JVD.  RESP:  No IWOB.  Fair aeration bilaterally. CVS:  RRR. Heart sounds normal.  ABD/GI/GU: BS+. Abd soft, NTND.  MSK/EXT:  Moves extremities.  BLE weakness. SKIN: no apparent skin lesion or wound NEURO: Awake, alert and oriented appropriately.  No apparent focal neuro deficit other than BLE weakness, 3/5 with hip flexion. PSYCH: Calm. Normal affect.   Procedures:  None   Assessment and plan: Active Problems:   Acute kidney injury superimposed on chronic kidney disease ([name])   Essential hypertension   Paroxysmal A-fib ([name])    (HFpEF) heart failure with preserved ejection fraction ([name])   HIV (human immunodeficiency virus infection) ([place])   History of pulmonary embolism   Type 2 diabetes mellitus with hyperlipidemia ([name])   Polysubstance abuse ([place])   Suicidal ideations   COPD (chronic obstructive pulmonary disease) ([name])   Back pain  AKI superimposed on CKD-4: CR in the range of 3.3 likely his new baseline. -Monitor intermittently. -Avoid nephrotoxic meds -Discontinue IV fluid   Essential hypertension -Continue diltiazem, hydralazine, and Imdur   Paroxysmal A-fib ([name]) -Continue diltiazem -Not on anticoagulation due to risk of bleeding.   Chronic diastolic CHF: Not on diuretics.  Stable. -Discontinue IV fluid.  History of CAD: Presented with chest pain in the setting of cocaine abuse.  -Advised to refrain from cocaine. -Continue home meds including Plavix and aspirin    HIV -Continue Juluca   History of PE: Not on anticoagulation likely due to risk of bleeding   Controlled NIDDM-2: A1c 7.7% in 06/2022.  Blood glucose within acceptable range.  Has been off insulin. -Repeat hemoglobin A1c   Polysubstance abuse ([place]): UDS positive for cocaine. -Advised to refrain   Suicidal ideations: Evaluated by psychiatry. No role for inpatient admission. -Continue trazodone and fluoxetine   COPD (chronic obstructive pulmonary disease) ([name]) -Continue Incruse Ellipta, Dulera and Duoneb PRN   Back pain/history of lumbar spinal stenosis: Has BLE weakness but no radiculopathy or bowel or bladder habit change. -Pain control/PT/OT  Moderate malnutrition Body mass index is 25.48 kg/m. Nutrition Problem: Moderate Malnutrition Etiology: chronic illness (CHF, CKD, HIV) Signs/Symptoms: moderate fat depletion, severe muscle depletion Interventions: Ensure Enlive (each supplement provides 350kcal and 20 grams of protein), MVI, Liberalize Diet   DVT prophylaxis:  heparin injection 5,000 Units Start: 09/09/22  1400  Code Status: Full code Family Communication: None at the site Level of care: Med-Surg Status is: Inpatient Remains inpatient appropriate because: Difficult to place   Final disposition: Home versus SNF Consultants:  None  Sch Meds:  Scheduled Meds:  aspirin  81 mg Oral Daily   atorvastatin  40 mg Oral Daily   clopidogrel  75 mg Oral Daily   diltiazem  120 mg Oral Daily   dolutegravir-rilpivirine  1 tablet Oral Q lunch   feeding supplement  237 mL Oral BID BM   FLUoxetine  20 mg Oral Daily   gabapentin  300 mg Oral BID   heparin  5,000 Units Subcutaneous Q8H   hydrALAZINE  50 mg Oral Q8H   isosorbide mononitrate  15 mg Oral Daily   lidocaine  1 patch Transdermal Q24H   mometasone-formoterol  2 puff Inhalation BID   multivitamin  1 tablet Oral QHS   polyethylene glycol  17 g Oral BID   senna  1 tablet Oral BID   sodium chloride flush  3 mL Intravenous Q12H   traZODone  50 mg Oral QHS   umeclidinium bromide  1 puff Inhalation Daily   Continuous Infusions:  sodium chloride 75 mL/hr at 10/01/22 0556   PRN Meds:.acetaminophen **OR** acetaminophen, guaiFENesin, HYDROcodone-acetaminophen, ipratropium-albuterol, mouth rinse  Antimicrobials: Anti-infectives (From admission, onward)    Start     Dose/Rate Route Frequency Ordered Stop   09/18/22 1200  dolutegravir-rilpivirine (JULUCA) 50-25 MG per tablet 1 tablet  Status:  Discontinued        1 tablet Oral Daily with lunch 09/17/22 1332 09/17/22 1336   09/18/22 1200  dolutegravir (TIVICAY) tablet 50 mg  Status:  Discontinued       See Hyperspace for full Linked Orders Report.   50 mg Oral Daily with lunch 09/17/22 1336 09/17/22 1517   09/18/22 1200  rilpivirine (EDURANT) tablet 25 mg  Status:  Discontinued       See Hyperspace for full Linked Orders Report.   25 mg Oral Daily with lunch 09/17/22 1336 09/17/22 1517   09/18/22 1200  dolutegravir-rilpivirine (JULUCA) 50-25 MG per tablet 1 tablet        1 tablet Oral Daily  with lunch 09/17/22 1517     09/09/22 1400  dolutegravir-rilpivirine (JULUCA) 50-25 MG per tablet 1 tablet  Status:  Discontinued        1 tablet Oral Daily before lunch 09/09/22 1350 09/12/22 0832        I have personally reviewed the following labs and images: CBC: No results for input(s): "WBC", "NEUTROABS", "HGB", "HCT", "MCV", "PLT" in the last 168 hours. BMP &GFR Recent Labs  Lab 09/25/22 0420 09/26/22 0257 09/29/22 0408 09/30/22 0439 10/01/22 0718  NA 141 141 140 138 142  K 4.2 4.4 4.9 4.7 5.0  CL 115* 115* 113* 112* 117*  CO2 20* 20* 19* 20* 19*  GLUCOSE 101* 142* 99 120* 93  BUN 29* 35* 37* 41* 39*  CREATININE 3.02* 3.13* 3.35* 3.38* 3.37*  CALCIUM 8.5* 8.4* 8.9 8.3* 8.4*   Estimated Creatinine Clearance: 19.2 mL/min (A) (by C-G formula based on SCr of 3.37 mg/dL (H)). Liver & Pancreas: No results for  input(s): "AST", "ALT", "ALKPHOS", "BILITOT", "PROT", "ALBUMIN" in the last 168 hours. No results for input(s): "LIPASE", "AMYLASE" in the last 168 hours. No results for input(s): "AMMONIA" in the last 168 hours. Diabetic: No results for input(s): "HGBA1C" in the last 72 hours. Recent Labs  Lab 09/26/22 0716 09/26/22 1114 09/26/22 1609 09/27/22 0731 09/29/22 2141  GLUCAP 97 99 143* 93 158*   Cardiac Enzymes: No results for input(s): "CKTOTAL", "CKMB", "CKMBINDEX", "TROPONINI" in the last 168 hours. No results for input(s): "PROBNP" in the last 8760 hours. Coagulation Profile: No results for input(s): "INR", "PROTIME" in the last 168 hours. Thyroid Function Tests: No results for input(s): "TSH", "T4TOTAL", "FREET4", "T3FREE", "THYROIDAB" in the last 72 hours. Lipid Profile: No results for input(s): "CHOL", "HDL", "LDLCALC", "TRIG", "CHOLHDL", "LDLDIRECT" in the last 72 hours. Anemia Panel: No results for input(s): "VITAMINB12", "FOLATE", "FERRITIN", "TIBC", "IRON", "RETICCTPCT" in the last 72 hours. Urine analysis:    Component Value Date/Time    COLORURINE YELLOW 09/09/2022 1026   APPEARANCEUR CLEAR 09/09/2022 1026   LABSPEC 1.013 09/09/2022 1026   PHURINE 5.0 09/09/2022 1026   GLUCOSEU NEGATIVE 09/09/2022 1026   HGBUR MODERATE (A) 09/09/2022 1026   BILIRUBINUR NEGATIVE 09/09/2022 1026   KETONESUR NEGATIVE 09/09/2022 1026   PROTEINUR 100 (A) 09/09/2022 1026   UROBILINOGEN 0.2 08/13/2015 1030   NITRITE NEGATIVE 09/09/2022 1026   LEUKOCYTESUR NEGATIVE 09/09/2022 1026   Sepsis Labs: Invalid input(s): "PROCALCITONIN", "LACTICIDVEN"  Microbiology: No results found for this or any previous visit (from the past 240 hour(s)).  Radiology Studies: No results found.    Everett Ehrler T. [place]mswick  If 7PM-7AM, please contact night-coverage [email or web address] 10/01/2022, 12:35 PM

## 2022-10-01 NOTE — Progress Notes (Signed)
Occupational Therapy Note  Discussed progress with [facility]. Met with pt and he appears to be progressing toward goal of DC home as he is unable to be placed for rehab at [facility]. Pt continues to complain of "blurry vision" at times. Recommend follow up with eye doctor as he has not seen an eye doctor "in years". Acute OT to continue to follow with goal of DC home.     10/01/22 1300  OT Visit Information  Last OT Received On 10/01/22  Assistance Needed +1  History of Present Illness 71 y.o. male admitted 09/09/22 with chest pain, AKI on CKD stage IIIb, Suicidal ideations in the setting of depression. Recently hospitalized 11/30-12/1/23. PMHx:HTN, chronic HFrEF with recovered LVEF 60 to 65%, PAF on Eliquis, CAD s/p CABG and stenting, DM, CKD stage IIIb, HIV on HAART, and cocaine abuse.  Pain Assessment  Pain Assessment Faces  Pain Location low back  Pain Descriptors / Indicators Discomfort;Grimacing  Pain Intervention(s) Limited activity within patient's tolerance  Cognition  Arousal/Alertness Awake/alert  Behavior During Therapy WFL for tasks assessed/performed  Overall Cognitive Status No family/caregiver present to determine baseline cognitive functioning  OT Goal Progression  Progress towards OT goals Goals met and updated - see care plan  OT General Charges  $OT Visit 1 Visit   Maurie Boettcher, OT/L   Acute OT Clinical Specialist [place] Pager [phone] Office [phone]

## 2022-10-01 NOTE — Progress Notes (Signed)
Occupational Therapy Treatment Patient Details Name: Michael [name] MRN: [id number] DOB: 09-30-1951 Today's Date: 10/01/2022   History of present illness 71 y.o. male admitted 09/09/22 with chest pain, AKI on CKD stage IIIb, Suicidal ideations in the setting of depression. Recently hospitalized 11/30-12/1/23. PMHx:HTN, chronic HFrEF with recovered LVEF 60 to 65%, PAF on Eliquis, CAD s/p CABG and stenting, DM, CKD stage IIIb, HIV on HAART, and cocaine abuse.   OT comments  [facility] OT session: Discussed with OTR on current goals and updating goals for continued OT. Patient continues to make good gains with OT treatment. Patient has increased LB dressing from min assist to supervision for donning socks and pants. Patient wears occulusion glasses during mobility and states they are helpful with his double vision. Patient to continue to be followed by Acute OT with [facility] program to increase independence with safety with self care.    Recommendations for follow up therapy are one component of a multi-disciplinary discharge planning process, led by the attending physician.  Recommendations may be updated based on patient status, additional functional criteria and insurance authorization.    Follow Up Recommendations  Skilled nursing-short term rehab (<3 hours/day)     Assistance Recommended at Discharge Frequent or constant Supervision/Assistance  Patient can return home with the following  Assist for transportation;A little help with walking and/or transfers;A little help with bathing/dressing/bathroom   Equipment Recommendations  None recommended by OT    Recommendations for Other Services      Precautions / Restrictions Precautions Precautions: Fall Restrictions Weight Bearing Restrictions: No       Mobility Bed Mobility Overal bed mobility: Modified Independent             General bed mobility comments: extra time    Transfers Overall transfer level: Needs  assistance Equipment used: Rolling walker (2 wheels), Rollator (4 wheels) (platform rollator) Transfers: Sit to/from Stand Sit to Stand: Min guard           General transfer comment: min guard when performing mobility in room with RW in and out of bathroom and platform rollator in hallway     Balance Overall balance assessment: Needs assistance Sitting-balance support: No upper extremity supported, Feet supported Sitting balance-Leahy Scale: Good     Standing balance support: No upper extremity supported, During functional activity Standing balance-Leahy Scale: Fair Standing balance comment: able to stand in shower unsupported for peri area bathing                           ADL either performed or assessed with clinical judgement   ADL Overall ADL's : Needs assistance/impaired     Grooming: Applying deodorant;Set up;Sitting   Upper Body Bathing: Set up;Sitting Upper Body Bathing Details (indicate cue type and reason): in shower Lower Body Bathing: Min guard;Sit to/from stand Lower Body Bathing Details (indicate cue type and reason): min guard when standing in shower for safety Upper Body Dressing : Set up;Sitting Upper Body Dressing Details (indicate cue type and reason): donned pullover top Lower Body Dressing: Supervision/safety;Sit to/from stand Lower Body Dressing Details (indicate cue type and reason): patient able to donn pants and socks seated/standing with supervision for safety when standing         Tub/ Shower Transfer: Min guard;Walk-in shower;BSC/3in1;Grab bars Tub/Shower Transfer Details (indicate cue type and reason): 6 inch rise to step over to enter shower with min guard and use of rails.   General ADL Comments: good gains  in LB dressing    Extremity/Trunk Assessment              Vision       Perception     Praxis      Cognition Arousal/Alertness: Awake/alert Behavior During Therapy: WFL for tasks assessed/performed Overall  Cognitive Status: Within Functional Limits for tasks assessed                                 General Comments: eager to participate with OT        Exercises Exercises: General Upper Extremity General Exercises - Upper Extremity Shoulder Flexion: Strengthening, Both, 10 reps, Seated, Theraband Theraband Level (Shoulder Flexion): Level 2 (Red) Shoulder ABduction: Strengthening, Both, 15 reps, Seated, Theraband Theraband Level (Shoulder Abduction): Level 2 (Red) Elbow Extension: Strengthening, Both, 10 reps, Seated, Theraband Theraband Level (Elbow Extension): Level 2 (Red)    Shoulder Instructions       General Comments      Pertinent Vitals/ Pain       Pain Assessment Pain Assessment: Faces Faces Pain Scale: Hurts a little bit Pain Location: low back Pain Descriptors / Indicators: Discomfort, Grimacing Pain Intervention(s): Limited activity within patient's tolerance, Monitored during session, Repositioned, Heat applied  Home Living                                          Prior Functioning/Environment              Frequency  Min 5X/week        Progress Toward Goals  OT Goals(current goals can now be found in the care plan section)  Progress towards OT goals: Progressing toward goals  Acute Rehab OT Goals Patient Stated Goal: go home OT Goal Formulation: With patient Time For Goal Achievement: 09/27/22 Potential to Achieve Goals: Good ADL Goals Pt Will Perform Grooming: with supervision;standing Pt Will Perform Lower Body Dressing: with supervision;sit to/from stand Pt Will Transfer to Toilet: with supervision;ambulating;regular height toilet Pt/caregiver will Perform Home Exercise Program: Increased strength;Both right and left upper extremity;With theraband;With Supervision  Plan Discharge plan remains appropriate;Frequency remains appropriate    Co-evaluation                 AM-PAC OT "6 Clicks" Daily  Activity     Outcome Measure   Help from another person eating meals?: None Help from another person taking care of personal grooming?: A Little Help from another person toileting, which includes using toliet, bedpan, or urinal?: A Little Help from another person bathing (including washing, rinsing, drying)?: A Little Help from another person to put on and taking off regular upper body clothing?: A Little Help from another person to put on and taking off regular lower body clothing?: A Little 6 Click Score: 19    End of Session Equipment Utilized During Treatment: Rolling walker (2 wheels);Rollator (4 wheels) (platform rollator)  OT Visit Diagnosis: Unsteadiness on feet (R26.81);Muscle weakness (generalized) (M62.81)   Activity Tolerance Patient tolerated treatment well   Patient Left in chair;with call bell/phone within reach;with chair alarm set   Nurse Communication Mobility status        Time: 2426-8341 OT Time Calculation (min): 45 min  Charges: OT General Charges $OT Visit: 1 Visit OT Treatments $Self Care/Home Management : 23-37 mins $Therapeutic Exercise: 8-22 mins  Lodema Hong,  [facility] Acute Rehabilitation Services  Office [phone]   Trixie Dredge 10/01/2022, 9:50 AM

## 2022-10-01 NOTE — Plan of Care (Signed)

## 2022-10-01 NOTE — Progress Notes (Signed)
Mobility Specialist Progress Note:    10/01/22 1100  Mobility  Activity Ambulated with assistance in hallway  Level of Assistance Contact guard assist, steadying assist  Assistive Device Four wheel walker (Upright)  Distance Ambulated (ft) 120 ft  Activity Response Tolerated well  Mobility Referral Yes  $Mobility charge 1 Mobility   Pt was eager for mobility session. Pt c/o of left leg pain but had no c/o SOB or dizziness. Pt wore occulusion glasses and left in chair with alarm on and all needs met.   Royetta Crochet Mobility Specialist Please contact via [employer] or  Rehab office at [phone]

## 2022-10-02 DIAGNOSIS — E1169 Type 2 diabetes mellitus with other specified complication: Secondary | ICD-10-CM | POA: Diagnosis not present

## 2022-10-02 DIAGNOSIS — F1910 Other psychoactive substance abuse, uncomplicated: Secondary | ICD-10-CM | POA: Diagnosis not present

## 2022-10-02 DIAGNOSIS — I480 Paroxysmal atrial fibrillation: Secondary | ICD-10-CM | POA: Diagnosis not present

## 2022-10-02 DIAGNOSIS — I10 Essential (primary) hypertension: Secondary | ICD-10-CM | POA: Diagnosis not present

## 2022-10-02 LAB — HEMOGLOBIN A1C
Hgb A1c MFr Bld: 6.8 % — ABNORMAL HIGH (ref 4.8–5.6)
Mean Plasma Glucose: 148 mg/dL

## 2022-10-02 MED ORDER — FLEET ENEMA 7-19 GM/118ML RE ENEM
1.0000 | ENEMA | Freq: Every day | RECTAL | Status: DC | PRN
  Administered 2022-10-02: 1 via RECTAL
  Filled 2022-10-02 (×2): qty 1

## 2022-10-02 MED ORDER — BISACODYL 10 MG RE SUPP
10.0000 mg | Freq: Every day | RECTAL | Status: DC | PRN
  Administered 2022-10-02: 10 mg via RECTAL
  Filled 2022-10-02: qty 1

## 2022-10-02 NOTE — TOC Progression Note (Addendum)
Transition of Care [facility]) - Initial/Assessment Note    Patient Details  Name: Michael [name] MRN: [id number] Date of Birth: [birth year]  Transition of [facility]) CM/SW Contact:    Milinda Antis, [place] Phone Number: 10/02/2022, 11:53 AM  Clinical Narrative:                 LCSW met with patient at bedside to discuss discharge location.  The patient reports that once he has progressed, in reference to mobility, the plan is to return to the hotel where he was residing prior to admission if no other d/c location can be secured.   LCSW contacted the following SA treatment/housing options.  [facility]) [phone]- There was no answer.  LCSW unable to leave VM. [facility])- [phone].  No beds available and waiting list closed. [place] [phone]- number is no longer in [place] ([place])- [phone]- left VM [place]- [phone]- Number is no longer in service [place] ([place])- [phone]- Left VM [facility] [place])-[phone]- unable to leave VM [facility]- number is no longer in service  TOC following.  Expected Discharge Plan: Skilled Nursing Facility Barriers to Discharge: Continued Medical Work up, SNF Pending bed offer   Patient Goals and CMS Choice Patient states their goals for this hospitalization and ongoing recovery are:: strengthen myself          Expected Discharge Plan and Services In-house Referral: Clinical Social Work   Post Acute Care Choice: [place] Living arrangements for the past 2 months: Hotel/Motel                                      Prior Living Arrangements/Services Living arrangements for the past 2 months: Hotel/Motel Lives with:: Self Patient language and need for interpreter reviewed:: Yes Do you feel safe going back to the place where you live?: Yes      Need  for Family Participation in Patient Care: No (Comment) Care giver support system in place?: Yes (comment) Current home services: Other (comment) (none) Criminal Activity/Legal Involvement Pertinent to Current Situation/Hospitalization: No - Comment as needed  Activities of Daily Living Home Assistive Devices/Equipment: Cane (specify quad or straight) ADL Screening (condition at time of admission) Patient's cognitive ability adequate to safely complete daily activities?: Yes Is the patient deaf or have difficulty hearing?: No Does the patient have difficulty seeing, even when wearing glasses/contacts?: Yes Does the patient have difficulty concentrating, remembering, or making decisions?: Yes Patient able to express need for assistance with ADLs?: Yes Does the patient have difficulty dressing or bathing?: Yes Independently performs ADLs?: Yes (appropriate for developmental age) Does the patient have difficulty walking or climbing stairs?: Yes Weakness of Legs: Both Weakness of Arms/Hands: Both  Permission Sought/Granted Permission sought to share information with : Family Supports Permission granted to share information with : Yes, Verbal Permission Granted  Share Information with NAME: sister [name], son Aleph  Permission granted to share info w AGENCY: SNF        Emotional Assessment Appearance:: Appears stated age Attitude/Demeanor/Rapport: Engaged Affect (typically observed): Appropriate, Pleasant Orientation: : Oriented to Self, Oriented to Place, Oriented to  Time, Oriented to Situation Alcohol / Substance Use: Illicit Drugs Psych Involvement: Yes (comment)  Admission diagnosis:  ARF (acute renal failure) ([place]) [N17.9] Polysubstance abuse ([place]) [F19.10] AKI (  acute kidney injury) ([place]) [N17.9] Patient Active Problem List   Diagnosis Date Noted   Polysubstance abuse ([place]) 09/13/2022   ARF (acute renal failure) ([place]) 09/09/2022   (HFpEF) heart failure with preserved  ejection fraction ([place]) 09/09/2022   Elevated troponin 09/09/2022   Impaired functional mobility, balance, and endurance 11/29/2021   Transfusion history 11/29/2021   Unstable balance 11/29/2021   Dilated cardiomyopathy ([place]) 11/29/2021   Arthritis 11/29/2021   Diabetic foot infection ([place]) 06/20/2021   Presence of aortocoronary bypass graft 04/16/2021   Iron deficiency anemia 04/04/2021   Atypical chest pain 04/03/2021   Anemia of chronic disease 12/14/2020   Type 2 diabetes mellitus with hyperlipidemia ([place]) 08/14/2020   Idiopathic chronic gout of multiple sites without tophus 02/17/2020   Diabetic ulcer of toe of right foot associated with type 2 diabetes mellitus ([place]) 12/17/2019   Vitamin B12 deficiency 12/17/2019   Homelessness 12/13/2019   Cellulitis 12/07/2019   Osteomyelitis ([place]) 07/17/2019   Abscess or cellulitis of toe, right    Toe pain, right-second    MDD (major depressive disorder), recurrent episode, severe ([place]) 06/15/2019   Respiratory distress 04/12/2019   Pneumonia due to severe acute respiratory syndrome coronavirus 2 (SARS-CoV-2) 04/11/2019   COVID-19 virus infection 03/19/2019   Chest pain 10/03/2018   Malnutrition of moderate degree 09/21/2018   MDD (major depressive disorder), recurrent episode, moderate ([name])    Type II diabetes mellitus with renal manifestations ([place]) 05/04/2018   GERD (gastroesophageal reflux disease) 05/04/2018   S/P CABG (coronary artery bypass graft)    Left testicular pain    Depression 02/20/2018   Epididymo-orchitis, acute 02/20/2018   Essential hypertension 02/20/2018   Precordial chest pain 02/20/2018   Acute kidney injury superimposed on chronic kidney disease ([place]) 02/14/2018   HCAP (healthcare-associated pneumonia) 02/14/2018   History of pulmonary embolism 07/04/2017   Acute hyponatremia 05/11/2017   Hyperglycemia due to type 2 diabetes mellitus ([place]) 05/11/2017   CHF (congestive heart failure) ([place]) 04/01/2017    Hepatitis C 12/30/2016   Substance induced mood disorder ([place]) 11/23/2016   Chronic ischemic heart disease 11/12/2016   Paroxysmal A-fib ([place]) 11/12/2016   Back pain 04/18/2016   S/P carotid endarterectomy 11/15/2015   COPD (chronic obstructive pulmonary disease) ([place]) 10/22/2015   Stenosis of cervical spine with myelopathy ([place]) 10/20/2015   MDD (major depressive disorder), recurrent severe, without psychosis ([place]) 09/09/2015   History of fusion of cervical spine 08/28/2015   Cocaine-induced mood disorder ([place]) 08/14/2015   Cocaine abuse with cocaine-induced mood disorder ([place]) 08/14/2015   Gout 07/10/2015   Acute renal failure superimposed on stage 3 chronic kidney disease ([place]) 03/06/2015   Anemia 03/06/2015   Chronic kidney disease, stage III (moderate) ([place]) 03/06/2015   Hypoglycemia    Encounter for general adult medical examination with abnormal findings 02/09/2015   Cocaine use disorder, moderate, dependence ([place]) 12/13/2014   Substance or medication-induced depressive disorder with onset during withdrawal ([place]) 12/13/2014   Severe recurrent major depressive disorder with psychotic features ([place]) 12/12/2014   Major depressive disorder, recurrent, severe without psychotic features ([place])    Suicidal ideations 08/15/2014   Cervicalgia 06/28/2014   Lumbar radiculopathy, chronic 06/28/2014   Asthma, chronic 02/03/2014   S/P percutaneous transluminal coronary angioplasty 10/15/2013   3-vessel CAD 06/24/2013   ED (erectile dysfunction) of organic origin 07/07/2012   Cocaine abuse, continuous ([place]) 05/13/2012   Hypertension goal BP (blood pressure) < 140/80 04/29/2012   Chondromalacia of left knee 03/19/2012   Hyperlipidemia with target  LDL less than 100 02/12/2012   Fibromyalgia 02/12/2012   Cocaine use disorder ([place]) 01/10/2012    Class: Acute   Human immunodeficiency virus (HIV) disease ([place]) 12/16/2011   HIV (human immunodeficiency virus infection) ([place]) 08/27/2011    Uncontrolled type 2 diabetes with neuropathy 10/17/2000   PCP:  [name], MD Pharmacy:   [name] [address] [place] [address] Phone: [phone] Fax: [place] [address] [place] [address] Phone: [phone] Fax: [place], [place] - [address] [place] [address] Phone: [phone] Fax: [phone]     Social Determinants of Health ([place]) Social History: [place]: Food Insecurity Present (09/10/2022)  Housing: Medium Risk (09/10/2022)  Transportation Needs: Unmet Transportation Needs (09/10/2022)  Utilities: At Risk (09/10/2022)  Alcohol Screen: Low Risk  (06/15/2019)  Depression (PHQ2-9): High Risk (11/12/2021)  Financial Resource Strain: Medium Risk (09/16/2018)  Physical Activity: Unknown (04/22/2019)  Social Connections: Socially Isolated (09/16/2018)  Stress: Stress Concern Present (09/16/2018)  Tobacco Use: Low Risk  (09/09/2022)   SDOH Interventions:     Readmission Risk Interventions    07/05/2022   10:08 AM  Readmission Risk Prevention Plan  Transportation Screening Complete  Medication Review ([place]) Complete  PCP or Specialist appointment within 3-5 days of discharge Complete  HRI or [place] Complete  SW Recovery Care/Counseling Consult Complete  [place] Not Applicable

## 2022-10-02 NOTE — Progress Notes (Signed)
Mobility Specialist Progress Note:    10/02/22 1100  Mobility  Activity Transferred from chair to bed  Level of Assistance Minimal assist, patient does 75% or more  Distance Ambulated (ft) 3 ft  Activity Response Tolerated fair  Mobility Referral Yes  $Mobility charge 1 Mobility   Pt requested to transfer to bed. C/o pain and dizziness. Left pt in bed with all needs met.  Royetta Crochet Mobility Specialist Please contact via Solicitor or  Rehab office at [phone]

## 2022-10-02 NOTE — Progress Notes (Signed)
Physical Therapy Treatment Patient Details Name: Michael [name] MRN: [id number] DOB: 18-Aug-1951 Today's Date: 10/02/2022   History of Present Illness 71 y.o. male admitted 09/09/22 with chest pain, AKI on CKD stage IIIb, Suicidal ideations in the setting of depression. Recently hospitalized 11/30-12/1/23. PMHx:HTN, chronic HFrEF with recovered LVEF 60 to 65%, PAF on Eliquis, CAD s/p CABG and stenting, DM, CKD stage IIIb, HIV on HAART, and cocaine abuse.    PT Comments    [facility] PT Session: Pt curled up supine in bed. He reports he has not had BM in 6 days and is experiencing increased abdominal discomfort, however is willing to ambulate when he is told it could improve motility. Pt in obvious pain but able to get out of bed and come to standing at upright rollator with min guard. Pt request PT provide light min A with the gait belt "just in case" during ambulation. Pt require 1x seated rest break and is able to return to room and get in bed with min guard assist. Discussed level of independence needed for drug rehab with case manager and pt. Pt feels he still needs some SNF level rehab prior to going to drug rehab. Case manager continues to look for appropriate SNF however with pt hx of drug abuse, there are less opportunities. In which case STAR program is working to progress pt to independence.   Recommendations for follow up therapy are one component of a multi-disciplinary discharge planning process, led by the attending physician.  Recommendations may be updated based on patient status, additional functional criteria and insurance authorization.  Follow Up Recommendations  Other (comment) (drug rehab) Can patient physically be transported by private vehicle: Yes   Assistance Recommended at Discharge Set up Supervision/Assistance  Patient can return home with the following A little help with walking and/or transfers;A little help with bathing/dressing/bathroom;Direct supervision/assist for  medications management;Direct supervision/assist for financial management;Assist for transportation;Assistance with cooking/housework   Equipment Recommendations  Other (comment) (upright rollator)    Recommendations for Other Services Other (comment)     Precautions / Restrictions Precautions Precautions: Fall Restrictions Weight Bearing Restrictions: No     Mobility  Bed Mobility Overal bed mobility: Modified Independent             General bed mobility comments: extra time    Transfers Overall transfer level: Needs assistance Equipment used: Rollator (4 wheels) (platform) Transfers: Sit to/from Stand Sit to Stand: Min guard           General transfer comment: min guard for safety good power up to low handles and then placing forearms in  upright cradles    Ambulation/Gait Ambulation/Gait assistance: Min assist Gait Distance (Feet): 120 Feet (1x seated rest break) Assistive device: Rollator (4 wheels) (upright.) Gait Pattern/deviations: Step-through pattern, Decreased step length - right, Decreased step length - left, Narrow base of support Gait velocity: slowed Gait velocity interpretation: <1.31 ft/sec, indicative of household ambulator   General Gait Details: light contact min A at pt request as he is not feeling well, continues to have very narrow BoS, vc for increased BoS          Balance Overall balance assessment: Needs assistance Sitting-balance support: No upper extremity supported, Feet supported Sitting balance-Leahy Scale: Good     Standing balance support: No upper extremity supported, During functional activity Standing balance-Leahy Scale: Fair  Cognition Arousal/Alertness: Awake/alert Behavior During Therapy: WFL for tasks assessed/performed Overall Cognitive Status: Within Functional Limits for tasks assessed                                 General Comments: despite obvious  discomfort agreeable to work with therapy           General Comments General comments (skin integrity, edema, etc.): wears occlusion glasses for decreased dizziness with ambulation, grimacing throughout session when abdominal cramps start      Pertinent Vitals/Pain Pain Assessment Pain Assessment: Faces Faces Pain Scale: Hurts whole lot Pain Location: stomach cramps, from lack of BM in last 6 days Pain Descriptors / Indicators: Discomfort, Grimacing Pain Intervention(s): Limited activity within patient's tolerance, Monitored during session, Repositioned     PT Goals (current goals can now be found in the care plan section) Acute Rehab PT Goals PT Goal Formulation: With patient Time For Goal Achievement: 09/27/22 Potential to Achieve Goals: Good Progress towards PT goals: Progressing toward goals    Frequency    Min 3X/week      PT Plan Current plan remains appropriate       AM-PAC PT "6 Clicks" Mobility   Outcome Measure  Help needed turning from your back to your side while in a flat bed without using bedrails?: None Help needed moving from lying on your back to sitting on the side of a flat bed without using bedrails?: A Little Help needed moving to and from a bed to a chair (including a wheelchair)?: A Little Help needed standing up from a chair using your arms (e.g., wheelchair or bedside chair)?: A Little Help needed to walk in hospital room?: A Little Help needed climbing 3-5 steps with a railing? : A Lot 6 Click Score: 18    End of Session Equipment Utilized During Treatment: Gait belt Activity Tolerance: Patient tolerated treatment well Patient left: in bed;with call bell/phone within reach;with bed alarm set Nurse Communication: Mobility status PT Visit Diagnosis: Unsteadiness on feet (R26.81);Other abnormalities of gait and mobility (R26.89);History of falling (Z91.81);Muscle weakness (generalized) (M62.81);Difficulty in walking, not elsewhere classified  (R26.2)     Time: 8264-1583 PT Time Calculation (min) (ACUTE ONLY): 32 min  Charges:  $Gait Training: 8-22 mins $Therapeutic Activity: 8-22 mins                     Wei Newbrough B. Migdalia Dk PT, DPT Acute Rehabilitation Services Please use secure chat or  Call Office [phone]    [place] 10/02/2022, 3:49 PM

## 2022-10-02 NOTE — Progress Notes (Signed)
[facility]                                                                               Michael [name], is a 71 y.o. male, DOB - 09-17-1951, BZJ:[id number] Admit date - 09/09/2022    Outpatient Primary MD for the patient is Michael Bogus., MD  LOS - 22  days    Brief summary   71 year old M with PMH of HTN, diastolic CHF, paroxysmal A-fib, CAD, HIV, DM-2 and CKD-4 brought to ED by EMS with suicidal ideation (without specific plan) and recurrent chest pain after using cocaine at home.  UDS positive for cocaine. AKI improved with IV fluid hydration.  Renal ultrasound without obstructive etiology.  Chest pain workup unrevealing.  Therapy recommended SNF but not able to place.  Plan to discharge home when physically more capable.     Assessment & Plan    Assessment and Plan: Acute kidney injury superimposed on chronic kidney disease ([name]) CKD stage 4.    Improving. IV fluids discontinued.  Recommend outpatient nephrology follow up.  Avoid nephrotoxic medications.   Essential hypertension Well controlled.  Continue diltiazem, hydralazine, and Imdur  Paroxysmal A-fib ([place]) Patient is not on anticoagulation secondary to history of high bleeding risk Rate controlled with cardizem.   (HFpEF) heart failure with preserved ejection fraction ([name]) Stable. Not on diuretic therapy.  HIV (human immunodeficiency virus infection) ([place]) -Continue Juluca  History of pulmonary embolism Not on anticoagulation.   Type 2 diabetes mellitus with hyperlipidemia ([name]) CBG (last 3)  Recent Labs    09/29/22 2141  GLUCAP 158*   Cbg's are well controlled.   Polysubstance abuse ([place]) Noted. History of cocaine use.  Suicidal ideations Evaluated by psychiatry. No role for inpatient admission. -Continue trazodone and fluoxetine  COPD (chronic obstructive pulmonary disease) ([name]) Continue Incruse Ellipta, Dulera and Duoneb PRN  Back pain Patient appears to have a history  of lumbar spinal stenosis. Spine radiographs with no acute fractures.  His back pain has improved with analgesics.    RN Pressure Injury Documentation:    Malnutrition Type:  Nutrition Problem: Moderate Malnutrition Etiology: chronic illness (CHF, CKD, HIV)   Malnutrition Characteristics:  Signs/Symptoms: moderate fat depletion, severe muscle depletion   Nutrition Interventions:  Interventions: Ensure Enlive (each supplement provides 350kcal and 20 grams of protein), MVI, Liberalize Diet  Estimated body mass index is 25.48 kg/m as calculated from the following:   Height as of this encounter: '5\' 8"'$  (1.727 m).   Weight as of this encounter: 76 kg.  Code Status: full code.  DVT Prophylaxis:  heparin injection 5,000 Units Start: 09/09/22 1400   Level of Care: Level of care: Med-Surg Family Communication:none at bedside.   Disposition Plan:     plan for SNF when bed available vs home when he Is physically strong enough .  Procedures:  None.   Consultants:   None.   Antimicrobials:   Anti-infectives (From admission, onward)    Start     Dose/Rate Route Frequency Ordered Stop   09/18/22 1200  dolutegravir-rilpivirine (JULUCA) 50-25 MG per tablet 1 tablet  Status:  Discontinued        1 tablet  Oral Daily with lunch 09/17/22 1332 09/17/22 1336   09/18/22 1200  dolutegravir (TIVICAY) tablet 50 mg  Status:  Discontinued       See Hyperspace for full Linked Orders Report.   50 mg Oral Daily with lunch 09/17/22 1336 09/17/22 1517   09/18/22 1200  rilpivirine (EDURANT) tablet 25 mg  Status:  Discontinued       See Hyperspace for full Linked Orders Report.   25 mg Oral Daily with lunch 09/17/22 1336 09/17/22 1517   09/18/22 1200  dolutegravir-rilpivirine (JULUCA) 50-25 MG per tablet 1 tablet        1 tablet Oral Daily with lunch 09/17/22 1517     09/09/22 1400  dolutegravir-rilpivirine (JULUCA) 50-25 MG per tablet 1 tablet  Status:  Discontinued        1 tablet Oral Daily  before lunch 09/09/22 1350 09/12/22 0832        Medications  Scheduled Meds:  aspirin  81 mg Oral Daily   atorvastatin  40 mg Oral Daily   clopidogrel  75 mg Oral Daily   diltiazem  120 mg Oral Daily   dolutegravir-rilpivirine  1 tablet Oral Q lunch   feeding supplement  237 mL Oral BID BM   FLUoxetine  20 mg Oral Daily   gabapentin  300 mg Oral BID   heparin  5,000 Units Subcutaneous Q8H   hydrALAZINE  50 mg Oral Q8H   isosorbide mononitrate  15 mg Oral Daily   lidocaine  1 patch Transdermal Q24H   mometasone-formoterol  2 puff Inhalation BID   multivitamin  1 tablet Oral QHS   polyethylene glycol  17 g Oral BID   senna  1 tablet Oral BID   sodium chloride flush  3 mL Intravenous Q12H   traZODone  50 mg Oral QHS   umeclidinium bromide  1 puff Inhalation Daily   Continuous Infusions: PRN Meds:.acetaminophen **OR** acetaminophen, bisacodyl, guaiFENesin, HYDROcodone-acetaminophen, ipratropium-albuterol, mouth rinse    Subjective:   Michael [name] was seen and examined today.  No new complaints.   Objective:   Vitals:   10/01/22 1644 10/01/22 2110 10/02/22 0502 10/02/22 0925  BP: (!) 150/84 137/66 (!) 154/82 (!) 152/83  Pulse: 62 65 68 78  Resp: '18 18 18 18  '$ Temp: 97.8 F (36.6 C) 97.8 F (36.6 C) (!) 97.5 F (36.4 C) 98.2 F (36.8 C)  TempSrc: Oral Oral Oral   SpO2: 100% 98% 100% 99%  Weight:      Height:        Intake/Output Summary (Last 24 hours) at 10/02/2022 1241 Last data filed at 10/02/2022 0800 Gross per 24 hour  Intake 1315.42 ml  Output 100 ml  Net 1215.42 ml   Filed Weights   09/23/22 2047 09/26/22 0351 09/28/22 2124  Weight: 75.7 kg 71.1 kg 76 kg     Exam General exam: Appears calm and comfortable  Respiratory system: Clear to auscultation. Respiratory effort normal. Cardiovascular system: S1 & S2 heard, RRR. No JVD,  Gastrointestinal system: Abdomen is nondistended, soft and nontender. . Central nervous system: Alert and oriented.  No focal neurological deficits. Extremities: Symmetric 5 x 5 power. Skin: No rashes, Psychiatry:  Mood & affect appropriate.    Data Reviewed:  I have personally reviewed following labs and imaging studies   CBC Lab Results  Component Value Date   WBC 4.8 09/17/2022   RBC 2.70 (L) 09/17/2022   HGB 8.0 (L) 09/18/2022   HCT 23.4 (L) 09/18/2022   MCV  85.9 09/17/2022   MCH 27.8 09/17/2022   PLT 104 (L) 09/17/2022   MCHC 32.3 09/17/2022   RDW 15.4 09/17/2022   LYMPHSABS 0.9 09/05/2022   MONOABS 0.5 09/05/2022   EOSABS 0.1 09/05/2022   BASOSABS 0.0 52/84/1324     Last metabolic panel Lab Results  Component Value Date   NA 142 10/01/2022   K 5.0 10/01/2022   CL 117 (H) 10/01/2022   CO2 19 (L) 10/01/2022   BUN 39 (H) 10/01/2022   CREATININE 3.37 (H) 10/01/2022   GLUCOSE 93 10/01/2022   GFRNONAA 19 (L) 10/01/2022   GFRAA 33 (L) 12/12/2019   CALCIUM 8.4 (L) 10/01/2022   PHOS 2.8 09/18/2022   PROT 7.8 09/09/2022   ALBUMIN 4.0 09/09/2022   LABGLOB 3.2 12/26/2016   AGRATIO 1.2 12/26/2016   BILITOT 0.5 09/09/2022   ALKPHOS 93 09/09/2022   AST 24 09/09/2022   ALT 27 09/09/2022   ANIONGAP 6 10/01/2022    CBG (last 3)  Recent Labs    09/29/22 2141  GLUCAP 158*      Coagulation Profile: No results for input(s): "INR", "PROTIME" in the last 168 hours.   Radiology Studies: No results found.     Hosie Poisson M.D. [facility] 10/02/2022, 12:41 PM  Available via Epic secure chat 7am-7pm After 7 pm, please refer to night coverage provider listed on amion.

## 2022-10-02 NOTE — Progress Notes (Signed)
Mobility Specialist Progress Note:    10/02/22 1000  Mobility  Activity Stood at bedside  Level of Assistance Contact guard assist, steadying assist  Assistive Device Other (Comment) (HHA)  Activity Response Tolerated poorly  Mobility Referral Yes  $Mobility charge 1 Mobility   Pt was eager for mobility session but appeared have left eye closed and c/o of pain, dizziness, and constipation. Pt was able to stand at bedside with HHA while BP was taken. Sitting BP (160/94) and standing BP (151/93). Further mobility deferred d/t dizziness. Left pt in chair with all needs met.   Royetta Crochet Mobility Specialist Please contact via Solicitor or  Rehab office at [phone]

## 2022-10-02 NOTE — Progress Notes (Signed)
Occupational Therapy Treatment Patient Details Name: Michael [name] MRN: [id number] DOB: August 05, 1951 Today's Date: 10/02/2022   History of present illness 71 y.o. male admitted 09/09/22 with chest pain, AKI on CKD stage IIIb, Suicidal ideations in the setting of depression. Recently hospitalized 11/30-12/1/23. PMHx:HTN, chronic HFrEF with recovered LVEF 60 to 65%, PAF on Eliquis, CAD s/p CABG and stenting, DM, CKD stage IIIb, HIV on HAART, and cocaine abuse.   OT comments  [facility] OT session: Patient continues to make good progress with OT treatment. Address vision on this session with patient locating and retrieving items at sink with and without occulusion glasses with patient stating less double vision with glass and decreased dizziness. Patient performed mobility in hallway and visual scanning with identifying items in hallway. Discharge recommendations changed from SNF to drug rehab due to progress. Acute OT to continue to follow with [facility] program to increase independence and safety with self care.    Recommendations for follow up therapy are one component of a multi-disciplinary discharge planning process, led by the attending physician.  Recommendations may be updated based on patient status, additional functional criteria and insurance authorization.    Follow Up Recommendations  Other (comment) (Drug rehab)     Assistance Recommended at Discharge Intermittent Supervision/Assistance  Patient can return home with the following  Assist for transportation;A little help with walking and/or transfers;A little help with bathing/dressing/bathroom   Equipment Recommendations  None recommended by OT    Recommendations for Other Services      Precautions / Restrictions Precautions Precautions: Fall Restrictions Weight Bearing Restrictions: No       Mobility Bed Mobility Overal bed mobility: Modified Independent             General bed mobility comments: extra time     Transfers Overall transfer level: Needs assistance Equipment used: Rollator (4 wheels) Transfers: Sit to/from Stand Sit to Stand: Min guard           General transfer comment: min guard for sit to stands and to stedy     Balance Overall balance assessment: Needs assistance Sitting-balance support: No upper extremity supported, Feet supported Sitting balance-Leahy Scale: Good     Standing balance support: No upper extremity supported, During functional activity Standing balance-Leahy Scale: Fair Standing balance comment: able to stand at sink unsupported                           ADL either performed or assessed with clinical judgement   ADL Overall ADL's : Needs assistance/impaired     Grooming: Wash/dry hands;Wash/dry face;Oral care;Supervision/safety;Standing Grooming Details (indicate cue type and reason): patient able to locate all items                               General ADL Comments: focused on grooming and vision    Extremity/Trunk Assessment              Vision   Additional Comments: patient states blurred vision from afar and double vision when focusing on items close   Perception     Praxis      Cognition Arousal/Alertness: Awake/alert Behavior During Therapy: WFL for tasks assessed/performed Overall Cognitive Status: No family/caregiver present to determine baseline cognitive functioning  General Comments: required increased time to recall date        Exercises Exercises: General Upper Extremity General Exercises - Upper Extremity Shoulder Flexion: Strengthening, Both, 10 reps, Seated, Theraband Theraband Level (Shoulder Flexion): Level 2 (Red) Shoulder ABduction: Strengthening, Both, 15 reps, Seated, Theraband Theraband Level (Shoulder Abduction): Level 2 (Red)    Shoulder Instructions       General Comments addressed visual scanning at sink and in hallway to  address vision    Pertinent Vitals/ Pain       Pain Assessment Pain Assessment: Faces Faces Pain Scale: Hurts little more Pain Location: low back Pain Descriptors / Indicators: Discomfort, Grimacing Pain Intervention(s): Monitored during session, Repositioned, Heat applied  Home Living                                          Prior Functioning/Environment              Frequency  Min 5X/week        Progress Toward Goals  OT Goals(current goals can now be found in the care plan section)  Progress towards OT goals: Progressing toward goals  Acute Rehab OT Goals Patient Stated Goal: get better OT Goal Formulation: With patient Time For Goal Achievement: 10/15/22 Potential to Achieve Goals: Good ADL Goals Pt Will Perform Grooming: with supervision;standing Pt Will Perform Lower Body Dressing: with modified independence;sit to/from stand Pt Will Transfer to Toilet: with modified independence;ambulating Pt Will Perform Tub/Shower Transfer: Shower transfer;with modified independence;3 in 1 Pt/caregiver will Perform Home Exercise Program: Increased strength;Both right and left upper extremity;With written HEP provided;Independently Additional ADL Goal #1: Pt will independently verbalize 3 strategies to reduce risk of falls  Plan Discharge plan remains appropriate;Frequency remains appropriate    Co-evaluation                 AM-PAC OT "6 Clicks" Daily Activity     Outcome Measure   Help from another person eating meals?: None Help from another person taking care of personal grooming?: A Little Help from another person toileting, which includes using toliet, bedpan, or urinal?: A Little Help from another person bathing (including washing, rinsing, drying)?: A Little Help from another person to put on and taking off regular upper body clothing?: A Little Help from another person to put on and taking off regular lower body clothing?: A Little 6  Click Score: 19    End of Session Equipment Utilized During Treatment: Rollator (4 wheels) (platform)  OT Visit Diagnosis: Unsteadiness on feet (R26.81);Muscle weakness (generalized) (M62.81)   Activity Tolerance Patient tolerated treatment well   Patient Left in chair;with call bell/phone within reach;with chair alarm set   Nurse Communication Mobility status        Time: 0881-1031 OT Time Calculation (min): 47 min  Charges: OT General Charges $OT Visit: 1 Visit OT Treatments $Self Care/Home Management : 8-22 mins $Therapeutic Activity: 8-22 mins $Therapeutic Exercise: 8-22 mins  Lodema Hong, [place]  Office [phone]   Trixie Dredge 10/02/2022, 9:59 AM

## 2022-10-03 DIAGNOSIS — N179 Acute kidney failure, unspecified: Secondary | ICD-10-CM | POA: Diagnosis not present

## 2022-10-03 DIAGNOSIS — I5032 Chronic diastolic (congestive) heart failure: Secondary | ICD-10-CM | POA: Diagnosis not present

## 2022-10-03 DIAGNOSIS — I480 Paroxysmal atrial fibrillation: Secondary | ICD-10-CM | POA: Diagnosis not present

## 2022-10-03 DIAGNOSIS — I10 Essential (primary) hypertension: Secondary | ICD-10-CM | POA: Diagnosis not present

## 2022-10-03 LAB — CBC
HCT: 25.3 % — ABNORMAL LOW (ref 39.0–52.0)
Hemoglobin: 8.2 g/dL — ABNORMAL LOW (ref 13.0–17.0)
MCH: 28.6 pg (ref 26.0–34.0)
MCHC: 32.4 g/dL (ref 30.0–36.0)
MCV: 88.2 fL (ref 80.0–100.0)
Platelets: 145 10*3/uL — ABNORMAL LOW (ref 150–400)
RBC: 2.87 MIL/uL — ABNORMAL LOW (ref 4.22–5.81)
RDW: 14.7 % (ref 11.5–15.5)
WBC: 4.6 10*3/uL (ref 4.0–10.5)
nRBC: 0 % (ref 0.0–0.2)

## 2022-10-03 LAB — BASIC METABOLIC PANEL
Anion gap: 7 (ref 5–15)
BUN: 41 mg/dL — ABNORMAL HIGH (ref 8–23)
CO2: 20 mmol/L — ABNORMAL LOW (ref 22–32)
Calcium: 8.3 mg/dL — ABNORMAL LOW (ref 8.9–10.3)
Chloride: 113 mmol/L — ABNORMAL HIGH (ref 98–111)
Creatinine, Ser: 3.5 mg/dL — ABNORMAL HIGH (ref 0.61–1.24)
GFR, Estimated: 18 mL/min — ABNORMAL LOW (ref >60)
Glucose, Bld: 126 mg/dL — ABNORMAL HIGH (ref 70–99)
Potassium: 4.9 mmol/L (ref 3.5–5.1)
Sodium: 140 mmol/L (ref 135–145)

## 2022-10-03 NOTE — Progress Notes (Signed)
Physical Therapy Treatment Patient Details Name: Michael [name] MRN: [id number] DOB: November 13, 1950 Today's Date: 10/03/2022   History of Present Illness 71 y.o. male admitted 09/09/22 with chest pain, AKI on CKD stage IIIb, Suicidal ideations in the setting of depression. Recently hospitalized 11/30-12/1/23. PMHx:HTN, chronic HFrEF with recovered LVEF 60 to 65%, PAF on Eliquis, CAD s/p CABG and stenting, DM, CKD stage IIIb, HIV on HAART, and cocaine abuse.    PT Comments    [facility] PT Session: OT reported pt able to walk to bathroom with HHA this morning and recommended cane training. Pt agreeable. With min A for steadying pt able to work on ambulation with cane with cues for sequencing and wider BoS. Pt happy with his progress but does report increased muscle soreness in LE after ambulation. Continue to work towards pt independence for best possible discharge location.    Recommendations for follow up therapy are one component of a multi-disciplinary discharge planning process, led by the attending physician.  Recommendations may be updated based on patient status, additional functional criteria and insurance authorization.  Follow Up Recommendations  Other (comment) (drug rehab) Can patient physically be transported by private vehicle: Yes   Assistance Recommended at Discharge Set up Supervision/Assistance  Patient can return home with the following A little help with walking and/or transfers;A little help with bathing/dressing/bathroom;Direct supervision/assist for medications management;Direct supervision/assist for financial management;Assist for transportation;Assistance with cooking/housework   Equipment Recommendations  Other (comment) (upright rollator)    Recommendations for Other Services Other (comment)     Precautions / Restrictions Precautions Precautions: Fall Restrictions Weight Bearing Restrictions: No     Mobility  Bed Mobility Overal bed mobility: Needs  Assistance Bed Mobility: Sit to Supine       Sit to supine: Min assist   General bed mobility comments: light min A for bringing LE into bed    Transfers Overall transfer level: Needs assistance Equipment used: Straight cane Transfers: Sit to/from Stand Sit to Stand: Min guard           General transfer comment: min guard for safety vc for scooting forward in chair before power up    Ambulation/Gait Ambulation/Gait assistance: Min assist Gait Distance (Feet): 80 Feet Assistive device: Straight cane Gait Pattern/deviations: Step-through pattern, Decreased step length - right, Decreased step length - left, Narrow base of support Gait velocity: slowed Gait velocity interpretation: <1.31 ft/sec, indicative of household ambulator   General Gait Details: min A for steadying, vc for sequencing to maximize cane assistance, and for increased BoS      Balance Overall balance assessment: Needs assistance Sitting-balance support: No upper extremity supported, Feet supported Sitting balance-Leahy Scale: Good     Standing balance support: No upper extremity supported, During functional activity Standing balance-Leahy Scale: Fair                              Cognition Arousal/Alertness: Awake/alert Behavior During Therapy: WFL for tasks assessed/performed Overall Cognitive Status: Within Functional Limits for tasks assessed                                 General Comments: continues to work despite pain           General Comments  VSS on RA, Pt reports feeling much better now that he is not constipated.      Pertinent Vitals/Pain Pain Assessment Pain Assessment:  Faces Faces Pain Scale: Hurts even more Pain Location: back after ambulation Pain Descriptors / Indicators: Discomfort, Grimacing Pain Intervention(s): Monitored during session, Limited activity within patient's tolerance, Repositioned     PT Goals (current goals can now be  found in the care plan section) Acute Rehab PT Goals PT Goal Formulation: With patient Time For Goal Achievement: 09/27/22 Potential to Achieve Goals: Good Progress towards PT goals: Progressing toward goals    Frequency    Min 3X/week      PT Plan Current plan remains appropriate       AM-PAC PT "6 Clicks" Mobility   Outcome Measure  Help needed turning from your back to your side while in a flat bed without using bedrails?: None Help needed moving from lying on your back to sitting on the side of a flat bed without using bedrails?: A Little Help needed moving to and from a bed to a chair (including a wheelchair)?: A Little Help needed standing up from a chair using your arms (e.g., wheelchair or bedside chair)?: A Little Help needed to walk in hospital room?: A Little Help needed climbing 3-5 steps with a railing? : A Lot 6 Click Score: 18    End of Session Equipment Utilized During Treatment: Gait belt Activity Tolerance: Patient tolerated treatment well Patient left: in bed;with call bell/phone within reach;with bed alarm set Nurse Communication: Mobility status PT Visit Diagnosis: Unsteadiness on feet (R26.81);Other abnormalities of gait and mobility (R26.89);History of falling (Z91.81);Muscle weakness (generalized) (M62.81);Difficulty in walking, not elsewhere classified (R26.2)     Time: 6629-4765 PT Time Calculation (min) (ACUTE ONLY): 34 min  Charges:  $Gait Training: 23-37 mins                     Dinah Lupa B. Migdalia Dk PT, DPT Acute Rehabilitation Services Please use secure chat or  Call Office [phone]    [place] 10/03/2022, 1:01 PM

## 2022-10-03 NOTE — Progress Notes (Signed)
Nutrition Follow-up  DOCUMENTATION CODES:   Non-severe (moderate) malnutrition in context of chronic illness  INTERVENTION:  - Continue Ensure Enlive po BID, each supplement provides 350 kcal and 20 grams of protein.  NUTRITION DIAGNOSIS:   Moderate Malnutrition related to chronic illness (CHF, CKD, HIV) as evidenced by moderate fat depletion, severe muscle depletion.  GOAL:   Patient will meet greater than or equal to 90% of their needs  MONITOR:   Supplement acceptance, Labs, PO intake, Weight trends  REASON FOR ASSESSMENT:   Malnutrition Screening Tool    ASSESSMENT:   71 year old male who presented to the ED on 12/04 with chest pain. PMH of HTN, CHF, PAF, CAD s/p CABG and stenting, CKD stage IIIb, HIV on HAART, cocaine abuse, T2DM. Pt admitted with AKI on CKD, suicidal ideation, chest pain.  Meds reviewed: lipitor, rena-vit, miralax, senokot. Labs reviewed: BUN/Creatinine elevated.   Pt ate 100% of his breakfast this am. Per record, pt has mostly been eating 75-100% of his meals. Plan to continue supplements.   Pt is pending discharge to SNF.   Diet Order:   Diet Order             Diet regular Room service appropriate? Yes; Fluid consistency: Thin  Diet effective now                   EDUCATION NEEDS:   Education needs have been addressed  Skin:  Skin Assessment: Reviewed RN Assessment  Last BM:  12/27  Height:   Ht Readings from Last 1 Encounters:  09/12/22 '5\' 8"'$  (1.727 m)    Weight:   Wt Readings from Last 1 Encounters:  09/28/22 76 kg    Ideal Body Weight:     BMI:  Body mass index is 25.48 kg/m.  Estimated Nutritional Needs:   Kcal:  2000-2200  Protein:  95-110 grams  Fluid:  >2.0 L  Thalia Bloodgood, RD, LDN, CNSC.

## 2022-10-03 NOTE — Progress Notes (Signed)
Occupational Therapy Treatment Patient Details Name: Michael [name] MRN: [id number] DOB: May 11, 1951 Today's Date: 10/03/2022   History of present illness 71 y.o. male admitted 09/09/22 with chest pain, AKI on CKD stage IIIb, Suicidal ideations in the setting of depression. Recently hospitalized 11/30-12/1/23. PMHx:HTN, chronic HFrEF with recovered LVEF 60 to 65%, PAF on Eliquis, CAD s/p CABG and stenting, DM, CKD stage IIIb, HIV on HAART, and cocaine abuse.   OT comments  [facility] Patient OT session: Patient seen this session for bathing in shower, dressing, grooming, mobility, and UE HEP. Patient ambulated to shower and back and with HHA to simulate cane use which he uses at home. Patient progressed with standing in shower from min guard to supervision due to patient demonstrating improvement with balance and safety. Patient continues to be motivated towards therapy and will continue to be followed by acute OT with [facility] program to increase independence and safety with self care and mobility.    Recommendations for follow up therapy are one component of a multi-disciplinary discharge planning process, led by the attending physician.  Recommendations may be updated based on patient status, additional functional criteria and insurance authorization.    Follow Up Recommendations  Other (comment) (drug rehab)     Assistance Recommended at Discharge Intermittent Supervision/Assistance  Patient can return home with the following  Assist for transportation;A little help with walking and/or transfers;A little help with bathing/dressing/bathroom   Equipment Recommendations  None recommended by OT    Recommendations for Other Services      Precautions / Restrictions Precautions Precautions: Fall Restrictions Weight Bearing Restrictions: No       Mobility Bed Mobility Overal bed mobility: Modified Independent             General bed mobility comments: extra time     Transfers Overall transfer level: Needs assistance Equipment used: Rollator (4 wheels), None (platform rollator) Transfers: Sit to/from Stand Sit to Stand: Min guard           General transfer comment: HHA in room due to space and simulate cane use at home with min guard assist.  Mobility in hallway with platform rollator     Balance Overall balance assessment: Needs assistance Sitting-balance support: No upper extremity supported, Feet supported Sitting balance-Leahy Scale: Good     Standing balance support: No upper extremity supported, During functional activity Standing balance-Leahy Scale: Fair Standing balance comment: able to stand in shower and at sink without UE support except when turning in shower patient uses rails                           ADL either performed or assessed with clinical judgement   ADL Overall ADL's : Needs assistance/impaired     Grooming: Oral care;Standing;Supervision/safety Grooming Details (indicate cue type and reason): patient able to locate all items Upper Body Bathing: Set up;Sitting Upper Body Bathing Details (indicate cue type and reason): in shower Lower Body Bathing: Sit to/from stand;Supervison/ safety Lower Body Bathing Details (indicate cue type and reason): supervision for safety in shower Upper Body Dressing : Set up;Sitting Upper Body Dressing Details (indicate cue type and reason): donned pullover top Lower Body Dressing: Supervision/safety;Sit to/from stand Lower Body Dressing Details (indicate cue type and reason): patient able to donn pants and socks seated/standing with supervision for safety when standing         Tub/ Shower Transfer: Min guard;Walk-in shower;BSC/3in1;Grab bars Tub/Shower Transfer Details (indicate cue type and reason):  min guard for safety when stepping over 6 inch rise into shower Functional mobility during ADLs: Min guard General ADL Comments: hand held assist to ambulate to shower in  back with min guard assist    Extremity/Trunk Assessment              Vision       Perception     Praxis      Cognition Arousal/Alertness: Awake/alert Behavior During Therapy: WFL for tasks assessed/performed Overall Cognitive Status: Within Functional Limits for tasks assessed                                 General Comments: continues to be motivated towards therapy        Exercises Exercises: General Upper Extremity General Exercises - Upper Extremity Shoulder Flexion: Strengthening, Both, 10 reps, Seated, Theraband Theraband Level (Shoulder Flexion): Level 2 (Red) Shoulder ABduction: Strengthening, Both, 15 reps, Seated, Theraband Theraband Level (Shoulder Abduction): Level 2 (Red)    Shoulder Instructions       General Comments      Pertinent Vitals/ Pain       Pain Assessment Pain Assessment: Faces Faces Pain Scale: Hurts even more Pain Location: back Pain Descriptors / Indicators: Discomfort, Grimacing Pain Intervention(s): Monitored during session, Repositioned, Heat applied  Home Living                                          Prior Functioning/Environment              Frequency  Min 5X/week        Progress Toward Goals  OT Goals(current goals can now be found in the care plan section)  Progress towards OT goals: Progressing toward goals  Acute Rehab OT Goals Patient Stated Goal: get better OT Goal Formulation: With patient Time For Goal Achievement: 10/15/22 Potential to Achieve Goals: Good ADL Goals Pt Will Perform Grooming: with supervision;standing Pt Will Perform Lower Body Dressing: with modified independence;sit to/from stand Pt Will Transfer to Toilet: with modified independence;ambulating Pt Will Perform Tub/Shower Transfer: Shower transfer;with modified independence;3 in 1 Pt/caregiver will Perform Home Exercise Program: Increased strength;Both right and left upper extremity;With written  HEP provided;Independently Additional ADL Goal #1: Pt will independently verbalize 3 strategies to reduce risk of falls  Plan Discharge plan remains appropriate;Frequency remains appropriate    Co-evaluation                 AM-PAC OT "6 Clicks" Daily Activity     Outcome Measure   Help from another person eating meals?: None Help from another person taking care of personal grooming?: A Little Help from another person toileting, which includes using toliet, bedpan, or urinal?: A Little Help from another person bathing (including washing, rinsing, drying)?: A Little Help from another person to put on and taking off regular upper body clothing?: A Little Help from another person to put on and taking off regular lower body clothing?: A Little 6 Click Score: 19    End of Session Equipment Utilized During Treatment: Rollator (4 wheels);Gait belt (platform)  OT Visit Diagnosis: Unsteadiness on feet (R26.81);Muscle weakness (generalized) (M62.81)   Activity Tolerance Patient tolerated treatment well   Patient Left in chair;with call bell/phone within reach;with chair alarm set   Nurse Communication Mobility status  Time: 0825-0910 OT Time Calculation (min): 45 min  Charges: OT General Charges $OT Visit: 1 Visit OT Treatments $Self Care/Home Management : 23-37 mins $Therapeutic Exercise: 8-22 mins  Lodema Hong, OTA Acute Rehabilitation Services  Office [phone]   Trixie Dredge 10/03/2022, 9:17 AM

## 2022-10-03 NOTE — Progress Notes (Signed)
PROGRESS NOTE    Michael [name]  VWU:[id number] DOB: March 25, 1951 DOA: 09/09/2022 PCP: [name], MD   Brief Narrative: Michael [name] is a 71 y.o. male with a history of hypertension, chronic heart failure, paroxysmal atrial fibrillation, CAD s/p CABG/PCI, CKD, HIV, cocaine abuse. [name]. Admission complicated by significant AKI that responded to IV fluids. Discharge complicated by inability to place patient at a SNF.    Assessment and Plan: Acute kidney injury superimposed on chronic kidney disease ([place]) Creatinine of 6.32 on admission. Associated BUN of 81. Patient initially started on IV fluids with improvement of renal function towards baseline. Although In/Out show overall net positive, weight has significantly declined since admission. Creatinine continues to rise with cessation of IV fluids. Creatinine back up to 3.5 today. -Recheck BMP in AM and if creatinine continues to rise, restart IV fluids  Essential hypertension -Continue diltiazem, hydralazine, and Imdur  Paroxysmal A-fib ([name]) Patient is not on anticoagulation secondary to history of high bleeding risk -Continue diltiazem  (HFpEF) heart failure with preserved ejection fraction ([name]) Stable. Not on diuretic therapy.  HIV (human immunodeficiency virus infection) ([name]) -Continue Juluca  History of pulmonary embolism Noted. Not on anticoagulation.  Type 2 diabetes mellitus with hyperlipidemia ([name]) Hemoglobin A1C of 7.7%. Adequately controlled for age.   Polysubstance abuse ([place]) Noted. History of cocaine use.  Suicidal ideations Evaluated by psychiatry. No role for inpatient admission. -Continue trazodone and fluoxetine  COPD (chronic obstructive pulmonary disease) ([name]) -Continue Incruse Ellipta, Dulera and Duoneb PRN  CKD (chronic kidney disease), stage IV ([name]) Baseline serum creatinine appears to be around 2.2-2.4.  Patient will need nephrology follow-up as an outpatient.  Back pain Patient appears to have a history of lumbar spinal stenosis. He states a history of back surgery but is unable to tell me what was performed. Lumbar x-ray without acute fracture. Pain improved with Norco. -Continue Norco PRN   DVT prophylaxis: Heparin subq Code Status:   Code Status: Full Code Family Communication: None at bedside Disposition Plan: Discharge pending bed availability vs improvement of functional ability to go home   Consultants:  Psychiatry Infectious disease  Procedures:    Antimicrobials:     Subjective: No concerns today. Sitting in chair.  Objective: BP (!) 111/59 (BP Location: Right Arm)   Pulse 79   Temp 98.4 F (36.9 C) (Oral)   Resp 18   Ht '5\' 8"'$  (1.727 m)   Wt 76 kg   SpO2 100%   BMI 25.48 kg/m   Examination:  General exam: Appears calm and comfortable Respiratory system: Clear to auscultation. Respiratory effort normal. Cardiovascular system: S1 & S2 heard, RRR. Gastrointestinal system: Abdomen is nondistended, soft and nontender. Normal bowel sounds heard. Central nervous system: Alert and oriented. Musculoskeletal: No edema. No calf tenderness   Data Reviewed: I have personally reviewed following labs and imaging studies  CBC Lab Results  Component Value Date   WBC 4.6 10/03/2022   RBC 2.87 (L) 10/03/2022   HGB 8.2 (L) 10/03/2022   HCT 25.3 (L) 10/03/2022   MCV 88.2 10/03/2022   MCH 28.6 10/03/2022   PLT 145 (L) 10/03/2022   MCHC 32.4 10/03/2022   RDW 14.7 10/03/2022   LYMPHSABS 0.9 09/05/2022   MONOABS 0.5 09/05/2022   EOSABS 0.1 09/05/2022   BASOSABS 0.0 29/56/2130     Last metabolic panel Lab Results  Component Value Date   NA 140 10/03/2022   K  4.9 10/03/2022   CL 113 (H) 10/03/2022   CO2 20 (L) 10/03/2022   BUN 41 (H) 10/03/2022   CREATININE 3.50 (H) 10/03/2022   GLUCOSE 126 (H) 10/03/2022   GFRNONAA 18 (L) 10/03/2022   GFRAA 33 (L)  12/12/2019   CALCIUM 8.3 (L) 10/03/2022   PHOS 2.8 09/18/2022   PROT 7.8 09/09/2022   ALBUMIN 4.0 09/09/2022   LABGLOB 3.2 12/26/2016   AGRATIO 1.2 12/26/2016   BILITOT 0.5 09/09/2022   ALKPHOS 93 09/09/2022   AST 24 09/09/2022   ALT 27 09/09/2022   ANIONGAP 7 10/03/2022    GFR: Estimated Creatinine Clearance: 18.5 mL/min (A) (by C-G formula based on SCr of 3.5 mg/dL (H)).  No results found for this or any previous visit (from the past 240 hour(s)).    Radiology Studies: No results found.    LOS: 23 days    Cordelia Poche, MD [facility] 10/03/2022, 6:21 PM   If 7PM-7AM, please contact night-coverage [email or web address]

## 2022-10-03 NOTE — Progress Notes (Signed)
Mobility Specialist Progress Note:   10/03/22 1132  Mobility  Activity Ambulated with assistance in hallway  Level of Assistance Minimal assist, patient does 75% or more  Assistive Device Four wheel walker (Upright)  Distance Ambulated (ft) 140 ft  Activity Response Tolerated well  Mobility Referral Yes  $Mobility charge 1 Mobility   Pt was eager for mobility session. No c/o dizziness or SOB. MinA to stand. MinG while ambulating. Left pt in room with chair alarm on and all needs met.  Royetta Crochet Mobility Specialist Please contact via Solicitor or  Rehab office at [phone]

## 2022-10-04 DIAGNOSIS — I10 Essential (primary) hypertension: Secondary | ICD-10-CM | POA: Diagnosis not present

## 2022-10-04 DIAGNOSIS — N179 Acute kidney failure, unspecified: Secondary | ICD-10-CM | POA: Diagnosis not present

## 2022-10-04 DIAGNOSIS — I5032 Chronic diastolic (congestive) heart failure: Secondary | ICD-10-CM | POA: Diagnosis not present

## 2022-10-04 DIAGNOSIS — I480 Paroxysmal atrial fibrillation: Secondary | ICD-10-CM | POA: Diagnosis not present

## 2022-10-04 LAB — BASIC METABOLIC PANEL
Anion gap: 5 (ref 5–15)
BUN: 43 mg/dL — ABNORMAL HIGH (ref 8–23)
CO2: 20 mmol/L — ABNORMAL LOW (ref 22–32)
Calcium: 8.3 mg/dL — ABNORMAL LOW (ref 8.9–10.3)
Chloride: 115 mmol/L — ABNORMAL HIGH (ref 98–111)
Creatinine, Ser: 3.57 mg/dL — ABNORMAL HIGH (ref 0.61–1.24)
GFR, Estimated: 17 mL/min — ABNORMAL LOW (ref >60)
Glucose, Bld: 138 mg/dL — ABNORMAL HIGH (ref 70–99)
Potassium: 4.9 mmol/L (ref 3.5–5.1)
Sodium: 140 mmol/L (ref 135–145)

## 2022-10-04 NOTE — Progress Notes (Addendum)
PROGRESS NOTE    Michael [name]  IDP:[id number] DOB: [birth year] DOA: 09/09/2022 PCP: [name], MD   Brief Narrative: Michael [name] is a 71 y.o. male with a history of hypertension, chronic heart failure, paroxysmal atrial fibrillation, CAD s/p CABG/PCI, CKD, HIV, cocaine abuse. [name]. Admission complicated by significant AKI that responded to IV fluids. Discharge complicated by inability to place patient at a SNF.    Assessment and Plan: Acute kidney injury superimposed on chronic kidney disease ([place]) Creatinine of 6.32 on admission. Associated BUN of 81. Patient initially started on IV fluids with improvement of renal function towards baseline. Although In/Out show overall net positive, weight has significantly declined since admission. Creatinine continues to rise with cessation of IV fluids. Creatinine back up to 3.57 today and has possible reached plateau. -BMP in AM  Essential hypertension -Continue diltiazem, hydralazine, and Imdur  Paroxysmal A-fib ([name]) Patient is not on anticoagulation secondary to history of high bleeding risk -Continue diltiazem  (HFpEF) heart failure with preserved ejection fraction ([name]) Stable. Not on diuretic therapy.  HIV (human immunodeficiency virus infection) ([place]) -Continue Juluca  History of pulmonary embolism Noted. Not on anticoagulation.  Type 2 diabetes mellitus with hyperlipidemia ([name]) Hemoglobin A1C of 7.7%. Adequately controlled for age.   Polysubstance abuse ([place]) Noted. History of cocaine use.  Suicidal ideations Evaluated by psychiatry. No role for inpatient admission. -Continue trazodone and fluoxetine  COPD (chronic obstructive pulmonary disease) ([name]) -Continue Incruse Ellipta, Dulera and Duoneb PRN  CKD (chronic kidney disease), stage IV ([name]) Baseline serum creatinine appears to be around 2.2-2.4. Patient will need nephrology  follow-up as an outpatient.  Back pain Patient appears to have a history of lumbar spinal stenosis. He states a history of back surgery but is unable to tell me what was performed. Lumbar x-ray without acute fracture. Pain improved with Norco. -Continue Norco PRN   DVT prophylaxis: Heparin subq Code Status:   Code Status: Full Code Family Communication: None at bedside Disposition Plan: Discharge pending bed availability vs improvement of functional ability to go home   Consultants:  Psychiatry Infectious disease  Procedures:    Antimicrobials:     Subjective: Some left knee and back pain. No other concerns.  Objective: BP (!) 125/50 (BP Location: Right Arm)   Pulse 76   Temp 98.2 F (36.8 C) (Oral)   Resp 18   Ht '5\' 8"'$  (1.727 m)   Wt 76 kg   SpO2 100%   BMI 25.48 kg/m   Examination:  General: Well appearing, no distress   Data Reviewed: I have personally reviewed following labs and imaging studies  CBC Lab Results  Component Value Date   WBC 4.6 10/03/2022   RBC 2.87 (L) 10/03/2022   HGB 8.2 (L) 10/03/2022   HCT 25.3 (L) 10/03/2022   MCV 88.2 10/03/2022   MCH 28.6 10/03/2022   PLT 145 (L) 10/03/2022   MCHC 32.4 10/03/2022   RDW 14.7 10/03/2022   LYMPHSABS 0.9 09/05/2022   MONOABS 0.5 09/05/2022   EOSABS 0.1 09/05/2022   BASOSABS 0.0 44/31/5400     Last metabolic panel Lab Results  Component Value Date   NA 140 10/04/2022   K 4.9 10/04/2022   CL 115 (H) 10/04/2022   CO2 20 (L) 10/04/2022   BUN 43 (H) 10/04/2022   CREATININE 3.57 (H) 10/04/2022   GLUCOSE 138 (H) 10/04/2022   GFRNONAA 17 (L) 10/04/2022   GFRAA 33 (  L) 12/12/2019   CALCIUM 8.3 (L) 10/04/2022   PHOS 2.8 09/18/2022   PROT 7.8 09/09/2022   ALBUMIN 4.0 09/09/2022   LABGLOB 3.2 12/26/2016   AGRATIO 1.2 12/26/2016   BILITOT 0.5 09/09/2022   ALKPHOS 93 09/09/2022   AST 24 09/09/2022   ALT 27 09/09/2022   ANIONGAP 5 10/04/2022    GFR: Estimated Creatinine Clearance: 18.1  mL/min (A) (by C-G formula based on SCr of 3.57 mg/dL (H)).  No results found for this or any previous visit (from the past 240 hour(s)).    Radiology Studies: No results found.    LOS: 24 days    Cordelia Poche, MD [facility] 10/04/2022, 9:28 AM   If 7PM-7AM, please contact night-coverage [email or web address]

## 2022-10-04 NOTE — Progress Notes (Signed)
Mobility Specialist Progress Note:    10/04/22 1051  Mobility  Activity Ambulated with assistance in hallway  Level of Assistance Contact guard assist, steadying assist  Assistive Device Front wheel walker (Upright)  Distance Ambulated (ft) 144 ft  Activity Response Tolerated well  Mobility Referral Yes  $Mobility charge 1 Mobility   Pt was agreeable for mobility session. C/o dizziness and LLE pain. Pt had slower gait this session required increased time. Left pt in room with all needs met.   Michael [name] Mobility Specialist Please contact via Solicitor or  Rehab office at [phone]

## 2022-10-04 NOTE — Progress Notes (Signed)
Occupational Therapy Treatment Patient Details Name: Michael [name] MRN: [id number] DOB: [birth year] Today's Date: 10/04/2022   History of present illness 71 y.o. male admitted 09/09/22 with chest pain, AKI on CKD stage IIIb, Suicidal ideations in the setting of depression. Recently hospitalized 11/30-12/1/23. PMHx:HTN, chronic HFrEF with recovered LVEF 60 to 65%, PAF on Eliquis, CAD s/p CABG and stenting, DM, CKD stage IIIb, HIV on HAART, and cocaine abuse.   OT comments  STAR Patient OT session: Patient continues to make good progress with OT treatment with patient able to perform dressing seated on EOB with setup only. Patient performed mobility in room with straight cane to simulate home environment with patient requiring min guard for safety and demonstrated one instance of LOB but was able to correct. Patient to continue to be followed by acute OT with STAR program to increase self care to MI.    Recommendations for follow up therapy are one component of a multi-disciplinary discharge planning process, led by the attending physician.  Recommendations may be updated based on patient status, additional functional criteria and insurance authorization.    Follow Up Recommendations  Other (comment) (drug rehab)     Assistance Recommended at Discharge Intermittent Supervision/Assistance  Patient can return home with the following  Assist for transportation;A little help with walking and/or transfers;A little help with bathing/dressing/bathroom   Equipment Recommendations  None recommended by OT    Recommendations for Other Services      Precautions / Restrictions Precautions Precautions: Fall Restrictions Weight Bearing Restrictions: No       Mobility Bed Mobility Overal bed mobility: Needs Assistance Bed Mobility: Sit to Supine       Sit to supine: Supervision   General bed mobility comments: verbal cues only    Transfers Overall transfer level: Needs  assistance Equipment used: Rollator (4 wheels), Straight cane (platform rollator) Transfers: Sit to/from Stand Sit to Stand: Min guard           General transfer comment: performed mobility and transfers in room with straight cane and required min guard for safety     Balance Overall balance assessment: Needs assistance Sitting-balance support: No upper extremity supported, Feet supported Sitting balance-Leahy Scale: Good     Standing balance support: No upper extremity supported, During functional activity Standing balance-Leahy Scale: Fair Standing balance comment: able to stand at sink for grooming without UE support                           ADL either performed or assessed with clinical judgement   ADL Overall ADL's : Needs assistance/impaired     Grooming: Wash/dry hands;Wash/dry face;Oral care;Supervision/safety;Standing Grooming Details (indicate cue type and reason): at sink Upper Body Bathing: Supervision/ safety;Standing Upper Body Bathing Details (indicate cue type and reason): at sink     Upper Body Dressing : Set up;Sitting Upper Body Dressing Details (indicate cue type and reason): donned pullover top Lower Body Dressing: Set up;Sitting/lateral leans;Sit to/from stand Lower Body Dressing Details (indicate cue type and reason): ablet to donn UB/LB clothing with setup from EOB               General ADL Comments: continues to improve with dressing    Extremity/Trunk Assessment              Vision       Perception     Praxis      Cognition Arousal/Alertness: Awake/alert Behavior During Therapy: [facility] for tasks  assessed/performed Overall Cognitive Status: Within Functional Limits for tasks assessed                                          Exercises Exercises: General Upper Extremity General Exercises - Upper Extremity Shoulder Flexion: Strengthening, Both, 10 reps, Seated, Theraband Theraband Level (Shoulder  Flexion): Level 2 (Red) Shoulder ABduction: Strengthening, Both, 15 reps, Seated, Theraband Theraband Level (Shoulder Abduction): Level 2 (Red) Elbow Flexion: Strengthening, Both, 10 reps, Seated, Theraband Theraband Level (Elbow Flexion): Level 2 (Red) Elbow Extension: Strengthening, Both, 10 reps, Seated, Theraband Theraband Level (Elbow Extension): Level 2 (Red)    Shoulder Instructions       General Comments      Pertinent Vitals/ Pain       Pain Assessment Pain Assessment: 0-10 Pain Score: 5  Pain Location: back and left knee Pain Descriptors / Indicators: Discomfort, Grimacing Pain Intervention(s): Monitored during session, Repositioned  Home Living                                          Prior Functioning/Environment              Frequency  Min 5X/week        Progress Toward Goals  OT Goals(current goals can now be found in the care plan section)  Progress towards OT goals: Progressing toward goals  Acute Rehab OT Goals Patient Stated Goal: get better OT Goal Formulation: With patient Time For Goal Achievement: 10/15/22 Potential to Achieve Goals: Good ADL Goals Pt Will Perform Grooming: with supervision;standing Pt Will Perform Lower Body Dressing: with modified independence;sit to/from stand Pt Will Transfer to Toilet: with modified independence;ambulating Pt Will Perform Tub/Shower Transfer: Shower transfer;with modified independence;3 in 1 Pt/caregiver will Perform Home Exercise Program: Increased strength;Both right and left upper extremity;With written HEP provided;Independently Additional ADL Goal #1: Pt will independently verbalize 3 strategies to reduce risk of falls  Plan Discharge plan remains appropriate;Frequency remains appropriate    Co-evaluation                 AM-PAC OT "6 Clicks" Daily Activity     Outcome Measure   Help from another person eating meals?: None Help from another person taking care of  personal grooming?: A Little Help from another person toileting, which includes using toliet, bedpan, or urinal?: A Little Help from another person bathing (including washing, rinsing, drying)?: A Little Help from another person to put on and taking off regular upper body clothing?: A Little Help from another person to put on and taking off regular lower body clothing?: A Little 6 Click Score: 19    End of Session Equipment Utilized During Treatment: Rollator (4 wheels);Gait belt;Other (comment) (platform rollator and straight cane)  OT Visit Diagnosis: Unsteadiness on feet (R26.81);Muscle weakness (generalized) (M62.81)   Activity Tolerance Patient tolerated treatment well   Patient Left in chair;with call bell/phone within reach;with chair alarm set   Nurse Communication Mobility status        Time: 0263-7858 OT Time Calculation (min): 45 min  Charges: OT General Charges $OT Visit: 1 Visit OT Treatments $Self Care/Home Management : 8-22 mins $Therapeutic Activity: 8-22 mins $Therapeutic Exercise: 8-22 mins  Lodema Hong, [place]  [facility] [place] 10/04/2022,  10:35 AM

## 2022-10-05 DIAGNOSIS — I5032 Chronic diastolic (congestive) heart failure: Secondary | ICD-10-CM | POA: Diagnosis not present

## 2022-10-05 DIAGNOSIS — I480 Paroxysmal atrial fibrillation: Secondary | ICD-10-CM | POA: Diagnosis not present

## 2022-10-05 DIAGNOSIS — E875 Hyperkalemia: Secondary | ICD-10-CM | POA: Insufficient documentation

## 2022-10-05 DIAGNOSIS — N179 Acute kidney failure, unspecified: Secondary | ICD-10-CM | POA: Diagnosis not present

## 2022-10-05 DIAGNOSIS — I10 Essential (primary) hypertension: Secondary | ICD-10-CM | POA: Diagnosis not present

## 2022-10-05 LAB — BASIC METABOLIC PANEL
Anion gap: 9 (ref 5–15)
BUN: 41 mg/dL — ABNORMAL HIGH (ref 8–23)
CO2: 21 mmol/L — ABNORMAL LOW (ref 22–32)
Calcium: 8.5 mg/dL — ABNORMAL LOW (ref 8.9–10.3)
Chloride: 112 mmol/L — ABNORMAL HIGH (ref 98–111)
Creatinine, Ser: 3.57 mg/dL — ABNORMAL HIGH (ref 0.61–1.24)
GFR, Estimated: 17 mL/min — ABNORMAL LOW (ref >60)
Glucose, Bld: 95 mg/dL (ref 70–99)
Potassium: 5.5 mmol/L — ABNORMAL HIGH (ref 3.5–5.1)
Sodium: 142 mmol/L (ref 135–145)

## 2022-10-05 LAB — POTASSIUM: Potassium: 5.2 mmol/L — ABNORMAL HIGH (ref 3.5–5.1)

## 2022-10-05 NOTE — Assessment & Plan Note (Addendum)
Likely secondary to kidney disease. Mild. Improved.

## 2022-10-05 NOTE — Progress Notes (Signed)
PROGRESS NOTE    Michael [name]  OZH:[id number] DOB: 10-Feb-1951 DOA: 09/09/2022 PCP: [name], MD   Brief Narrative: Michael [name] is a 71 y.o. male with a history of hypertension, chronic heart failure, paroxysmal atrial fibrillation, CAD s/p CABG/PCI, CKD, HIV, cocaine abuse. [name]. Admission complicated by significant AKI that responded to IV fluids. Discharge complicated by inability to place patient at a SNF.    Assessment and Plan: Acute kidney injury superimposed on chronic kidney disease ([place]) Creatinine of 6.32 on admission. Associated BUN of 81. Patient initially started on IV fluids with improvement of renal function towards baseline. Although In/Out show overall net positive, weight has significantly declined since admission. Creatinine continues to rise with cessation of IV fluids. Creatinine back up to 3.57 today and has reached plateau. -BMP in AM  Essential hypertension -Continue diltiazem, hydralazine, and Imdur  Paroxysmal A-fib ([name]) Patient is not on anticoagulation secondary to history of high bleeding risk -Continue diltiazem  (HFpEF) heart failure with preserved ejection fraction ([name]) Stable. Not on diuretic therapy.  HIV (human immunodeficiency virus infection) ([place]) -Continue Juluca  History of pulmonary embolism Noted. Not on anticoagulation.  Type 2 diabetes mellitus with hyperlipidemia ([name]) Hemoglobin A1C of 7.7%. Adequately controlled for age.   Polysubstance abuse ([place]) Noted. History of cocaine use.  Suicidal ideations Evaluated by psychiatry. No role for inpatient admission. -Continue trazodone and fluoxetine  COPD (chronic obstructive pulmonary disease) ([name]) -Continue Incruse Ellipta, Dulera and Duoneb PRN  Hyperkalemia Likely secondary to kidney disease -Repeat potassium and if persistently elevated, will treat  CKD (chronic kidney disease),  stage IV ([name]) Baseline serum creatinine appears to be around 2.2-2.4. Patient will need nephrology follow-up as an outpatient.  Back pain Patient appears to have a history of lumbar spinal stenosis. He states a history of back surgery but is unable to tell me what was performed. Lumbar x-ray without acute fracture. Pain improved with Norco. -Continue Norco PRN   DVT prophylaxis: Heparin subq Code Status:   Code Status: Full Code Family Communication: None at bedside Disposition Plan: Discharge pending bed availability vs improvement of functional ability to go home   Consultants:  Psychiatry Infectious disease  Procedures:    Antimicrobials:     Subjective: No issues noted from overnight.  Objective: BP 133/72 (BP Location: Left Arm)   Pulse 63   Temp 97.8 F (36.6 C) (Oral)   Resp 16   Ht '5\' 8"'$  (1.727 m)   Wt 76 kg   SpO2 97%   BMI 25.48 kg/m   Examination:  General exam: Appears calm and comfortable   Data Reviewed: I have personally reviewed following labs and imaging studies  CBC Lab Results  Component Value Date   WBC 4.6 10/03/2022   RBC 2.87 (L) 10/03/2022   HGB 8.2 (L) 10/03/2022   HCT 25.3 (L) 10/03/2022   MCV 88.2 10/03/2022   MCH 28.6 10/03/2022   PLT 145 (L) 10/03/2022   MCHC 32.4 10/03/2022   RDW 14.7 10/03/2022   LYMPHSABS 0.9 09/05/2022   MONOABS 0.5 09/05/2022   EOSABS 0.1 09/05/2022   BASOSABS 0.0 62/95/2841     Last metabolic panel Lab Results  Component Value Date   NA 142 10/05/2022   K 5.5 (H) 10/05/2022   CL 112 (H) 10/05/2022   CO2 21 (L) 10/05/2022   BUN 41 (H) 10/05/2022   CREATININE 3.57 (H) 10/05/2022   GLUCOSE 95 10/05/2022  GFRNONAA 17 (L) 10/05/2022   GFRAA 33 (L) 12/12/2019   CALCIUM 8.5 (L) 10/05/2022   PHOS 2.8 09/18/2022   PROT 7.8 09/09/2022   ALBUMIN 4.0 09/09/2022   LABGLOB 3.2 12/26/2016   AGRATIO 1.2 12/26/2016   BILITOT 0.5 09/09/2022   ALKPHOS 93 09/09/2022   AST 24 09/09/2022   ALT 27  09/09/2022   ANIONGAP 9 10/05/2022    GFR: Estimated Creatinine Clearance: 18.1 mL/min (A) (by C-G formula based on SCr of 3.57 mg/dL (H)).  No results found for this or any previous visit (from the past 240 hour(s)).    Radiology Studies: No results found.    LOS: 25 days    Cordelia Poche, MD [facility] 10/05/2022, 8:36 AM   If 7PM-7AM, please contact night-coverage [email or web address]

## 2022-10-05 NOTE — Plan of Care (Signed)
  Problem: Activity: Goal: Ability to tolerate increased activity will improve Outcome: Progressing   Problem: Cardiac: Goal: Ability to achieve and maintain adequate cardiovascular perfusion will improve Outcome: Progressing   Problem: Nutritional: Goal: Maintenance of adequate nutrition will improve Outcome: Progressing

## 2022-10-06 DIAGNOSIS — I5032 Chronic diastolic (congestive) heart failure: Secondary | ICD-10-CM | POA: Diagnosis not present

## 2022-10-06 DIAGNOSIS — I480 Paroxysmal atrial fibrillation: Secondary | ICD-10-CM | POA: Diagnosis not present

## 2022-10-06 DIAGNOSIS — I10 Essential (primary) hypertension: Secondary | ICD-10-CM | POA: Diagnosis not present

## 2022-10-06 DIAGNOSIS — N179 Acute kidney failure, unspecified: Secondary | ICD-10-CM | POA: Diagnosis not present

## 2022-10-06 LAB — BASIC METABOLIC PANEL
Anion gap: 11 (ref 5–15)
BUN: 42 mg/dL — ABNORMAL HIGH (ref 8–23)
CO2: 20 mmol/L — ABNORMAL LOW (ref 22–32)
Calcium: 8.6 mg/dL — ABNORMAL LOW (ref 8.9–10.3)
Chloride: 110 mmol/L (ref 98–111)
Creatinine, Ser: 3.65 mg/dL — ABNORMAL HIGH (ref 0.61–1.24)
GFR, Estimated: 17 mL/min — ABNORMAL LOW (ref >60)
Glucose, Bld: 119 mg/dL — ABNORMAL HIGH (ref 70–99)
Potassium: 5.2 mmol/L — ABNORMAL HIGH (ref 3.5–5.1)
Sodium: 141 mmol/L (ref 135–145)

## 2022-10-06 MED ORDER — SODIUM CHLORIDE 0.9 % IV BOLUS
500.0000 mL | Freq: Once | INTRAVENOUS | Status: AC
  Administered 2022-10-06: 500 mL via INTRAVENOUS

## 2022-10-06 NOTE — Progress Notes (Signed)
Mobility Specialist Progress Note:   10/06/22 0938  Mobility  Activity Ambulated with assistance in hallway  Level of Assistance Contact guard assist, steadying assist  Assistive Device Four wheel walker (Upright)  Distance Ambulated (ft) 140 ft  Activity Response Tolerated well  Mobility Referral Yes  $Mobility charge 1 Mobility   Pt received in bathroom and agreeable. No complaints. Pt left in chair with all needs met, call bell in reach, and chair alarm on.   Andrey Campanile Mobility Specialist Please contact via SecureChat or  Rehab office at [phone]

## 2022-10-06 NOTE — Progress Notes (Signed)
PROGRESS NOTE    Michael [name]  [id number] DOB: May 16, 1951 DOA: 09/09/2022 PCP: [name], MD   Brief Narrative: Michael [name] is a 71 y.o. male with a history of hypertension, chronic heart failure, paroxysmal atrial fibrillation, CAD s/p CABG/PCI, CKD, HIV, cocaine abuse. [name]. Admission complicated by significant AKI that responded to IV fluids. Discharge complicated by inability to place patient at a SNF.    Assessment and Plan: Acute kidney injury superimposed on chronic kidney disease ([place]) Creatinine of 6.32 on admission. Associated BUN of 81. Patient initially started on IV fluids with improvement of renal function towards baseline. Although In/Out show overall net positive, weight has significantly declined since admission. Creatinine continues to rise with cessation of IV fluids. Creatinine up to 3.65 today. -NS IV fluids bolus x1 -BMP in AM  Essential hypertension -Continue diltiazem, hydralazine, and Imdur  Paroxysmal A-fib ([name]) Patient is not on anticoagulation secondary to history of high bleeding risk -Continue diltiazem  (HFpEF) heart failure with preserved ejection fraction ([name]) Stable. Not on diuretic therapy.  HIV (human immunodeficiency virus infection) ([place]) -Continue Juluca  History of pulmonary embolism Noted. Not on anticoagulation.  Type 2 diabetes mellitus with hyperlipidemia ([name]) Hemoglobin A1C of 7.7%. Adequately controlled for age.   Polysubstance abuse ([place]) Noted. History of cocaine use.  Suicidal ideations Evaluated by psychiatry. No role for inpatient admission. -Continue trazodone and fluoxetine  COPD (chronic obstructive pulmonary disease) ([name]) -Continue Incruse Ellipta, Dulera and Duoneb PRN  Hyperkalemia Likely secondary to kidney disease -Repeat potassium and if persistently elevated, will treat  CKD (chronic kidney disease), stage  IV ([name]) Baseline serum creatinine appears to be around 2.2-2.4. Patient will need nephrology follow-up as an outpatient.  Back pain Patient appears to have a history of lumbar spinal stenosis. He states a history of back surgery but is unable to tell me what was performed. Lumbar x-ray without acute fracture. Pain improved with Norco. -Continue Norco PRN   DVT prophylaxis: Heparin subq Code Status:   Code Status: Full Code Family Communication: None at bedside Disposition Plan: Discharge pending bed availability vs improvement of functional ability to go home   Consultants:  Psychiatry Infectious disease  Procedures:    Antimicrobials:     Subjective: No issues noted overnight.  Objective: BP 132/72 (BP Location: Right Arm)   Pulse 82   Temp 98 F (36.7 C) (Oral)   Resp 16   Ht '5\' 8"'$  (1.727 m)   Wt 76 kg   SpO2 98%   BMI 25.48 kg/m   Examination:  General exam: Appears calm and comfortable    Data Reviewed: I have personally reviewed following labs and imaging studies  CBC Lab Results  Component Value Date   WBC 4.6 10/03/2022   RBC 2.87 (L) 10/03/2022   HGB 8.2 (L) 10/03/2022   HCT 25.3 (L) 10/03/2022   MCV 88.2 10/03/2022   MCH 28.6 10/03/2022   PLT 145 (L) 10/03/2022   MCHC 32.4 10/03/2022   RDW 14.7 10/03/2022   LYMPHSABS 0.9 09/05/2022   MONOABS 0.5 09/05/2022   EOSABS 0.1 09/05/2022   BASOSABS 0.0 21/30/8657     Last metabolic panel Lab Results  Component Value Date   NA 141 10/06/2022   K 5.2 (H) 10/06/2022   CL 110 10/06/2022   CO2 20 (L) 10/06/2022   BUN 42 (H) 10/06/2022   CREATININE 3.65 (H) 10/06/2022   GLUCOSE 119 (H) 10/06/2022  GFRNONAA 17 (L) 10/06/2022   GFRAA 33 (L) 12/12/2019   CALCIUM 8.6 (L) 10/06/2022   PHOS 2.8 09/18/2022   PROT 7.8 09/09/2022   ALBUMIN 4.0 09/09/2022   LABGLOB 3.2 12/26/2016   AGRATIO 1.2 12/26/2016   BILITOT 0.5 09/09/2022   ALKPHOS 93 09/09/2022   AST 24 09/09/2022   ALT 27 09/09/2022    ANIONGAP 11 10/06/2022    GFR: Estimated Creatinine Clearance: 17.7 mL/min (A) (by C-G formula based on SCr of 3.65 mg/dL (H)).  No results found for this or any previous visit (from the past 240 hour(s)).    Radiology Studies: No results found.    LOS: 26 days    Cordelia Poche, MD [facility] 10/06/2022, 10:34 AM   If 7PM-7AM, please contact night-coverage [email or web address]

## 2022-10-06 NOTE — Plan of Care (Signed)
  Problem: Nutritional: Goal: Maintenance of adequate nutrition will improve Outcome: Progressing   Problem: Elimination: Goal: Will not experience complications related to bowel motility Outcome: Progressing Goal: Will not experience complications related to urinary retention Outcome: Progressing

## 2022-10-07 DIAGNOSIS — I5032 Chronic diastolic (congestive) heart failure: Secondary | ICD-10-CM | POA: Diagnosis not present

## 2022-10-07 DIAGNOSIS — I10 Essential (primary) hypertension: Secondary | ICD-10-CM | POA: Diagnosis not present

## 2022-10-07 DIAGNOSIS — I480 Paroxysmal atrial fibrillation: Secondary | ICD-10-CM | POA: Diagnosis not present

## 2022-10-07 DIAGNOSIS — N179 Acute kidney failure, unspecified: Secondary | ICD-10-CM | POA: Diagnosis not present

## 2022-10-07 LAB — BASIC METABOLIC PANEL
Anion gap: 3 — ABNORMAL LOW (ref 5–15)
BUN: 41 mg/dL — ABNORMAL HIGH (ref 8–23)
CO2: 21 mmol/L — ABNORMAL LOW (ref 22–32)
Calcium: 8.5 mg/dL — ABNORMAL LOW (ref 8.9–10.3)
Chloride: 114 mmol/L — ABNORMAL HIGH (ref 98–111)
Creatinine, Ser: 3.53 mg/dL — ABNORMAL HIGH (ref 0.61–1.24)
GFR, Estimated: 18 mL/min — ABNORMAL LOW (ref >60)
Glucose, Bld: 124 mg/dL — ABNORMAL HIGH (ref 70–99)
Potassium: 4.9 mmol/L (ref 3.5–5.1)
Sodium: 138 mmol/L (ref 135–145)

## 2022-10-07 IMAGING — CR DG CHEST 2V
2 series · 2 of 2 positions shown · non-contrast
Comparison: 08/14/2021

CLINICAL DATA: Intermittent chest pain for 3 weeks, short of breath

EXAM:
CHEST - 2 VIEW

[chest lat]
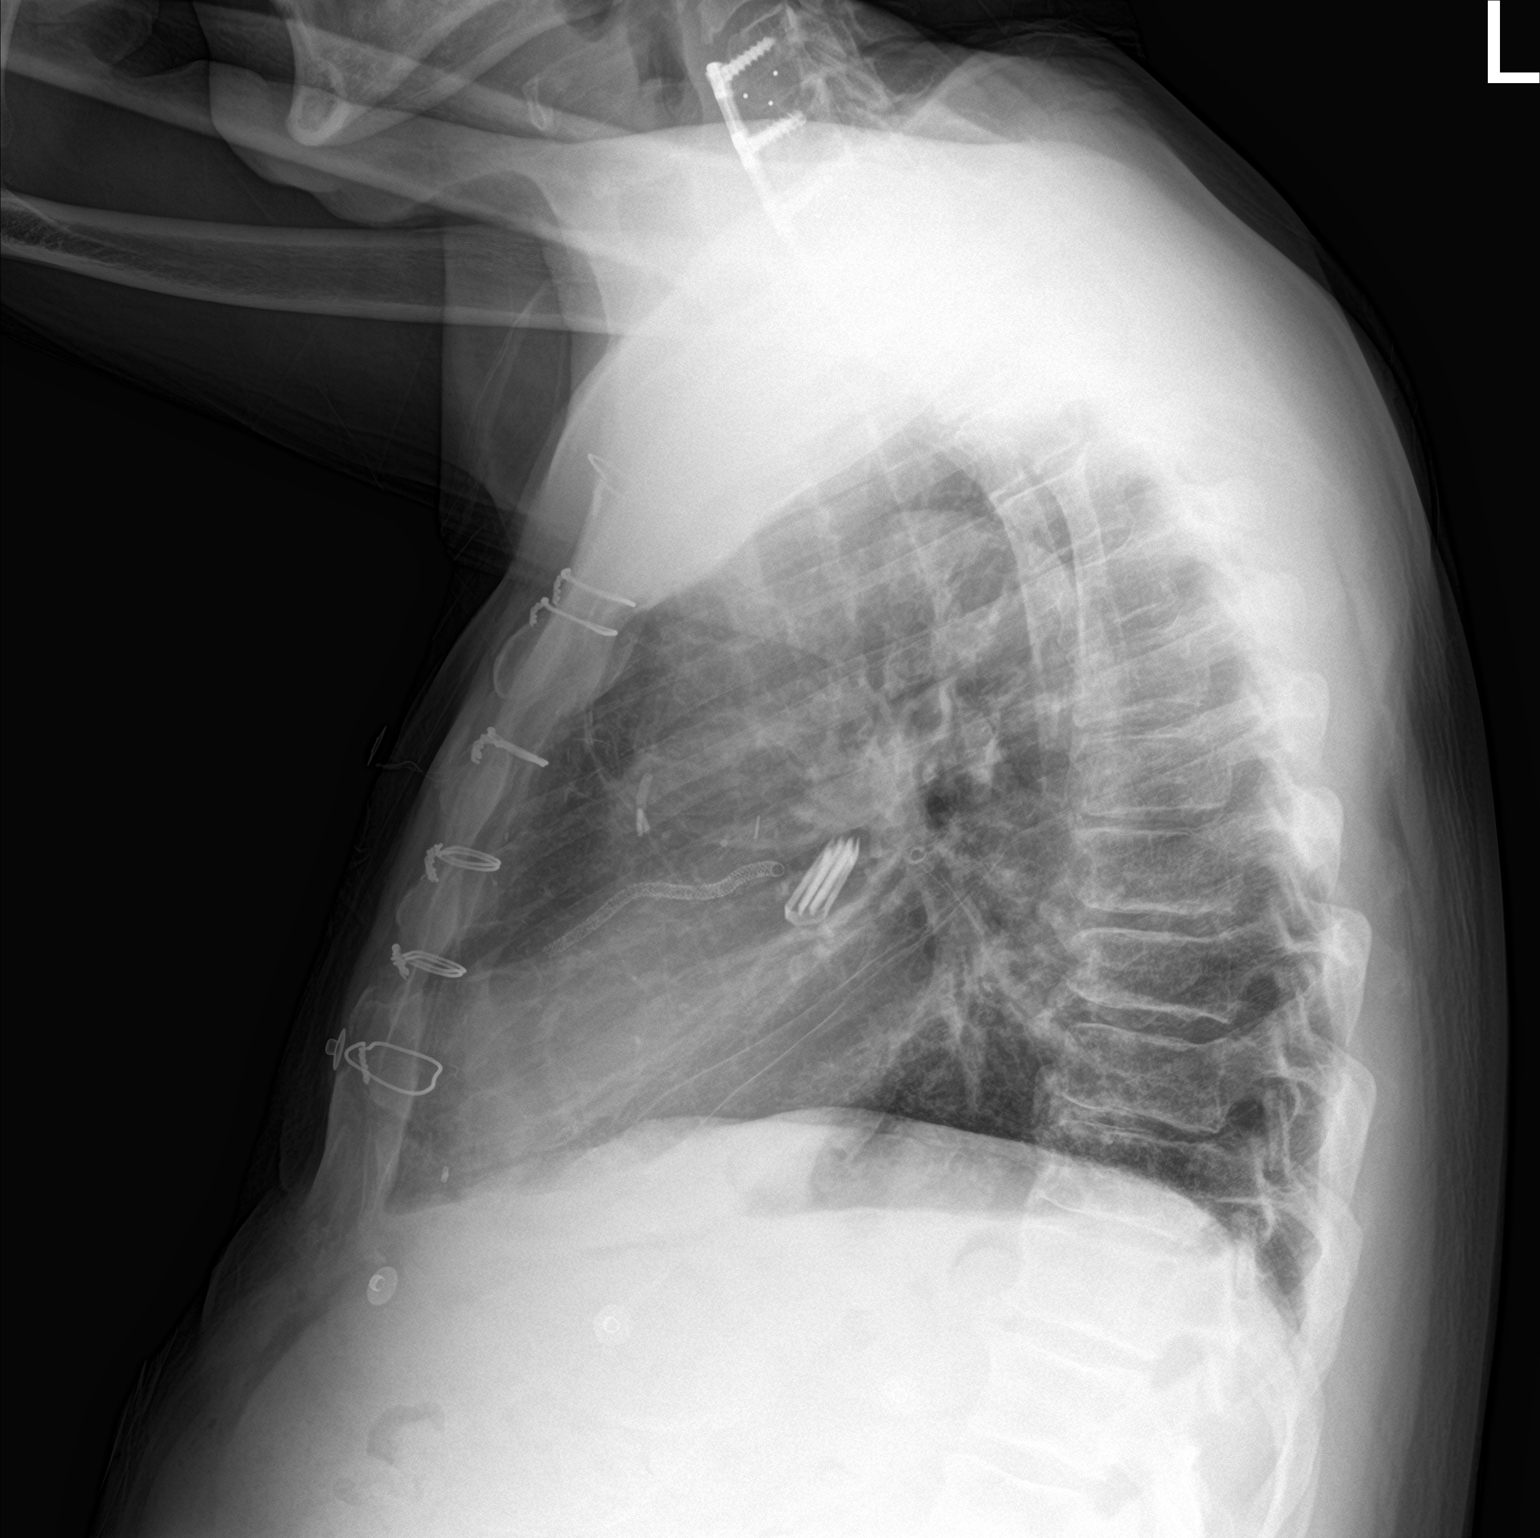

[chest ap]
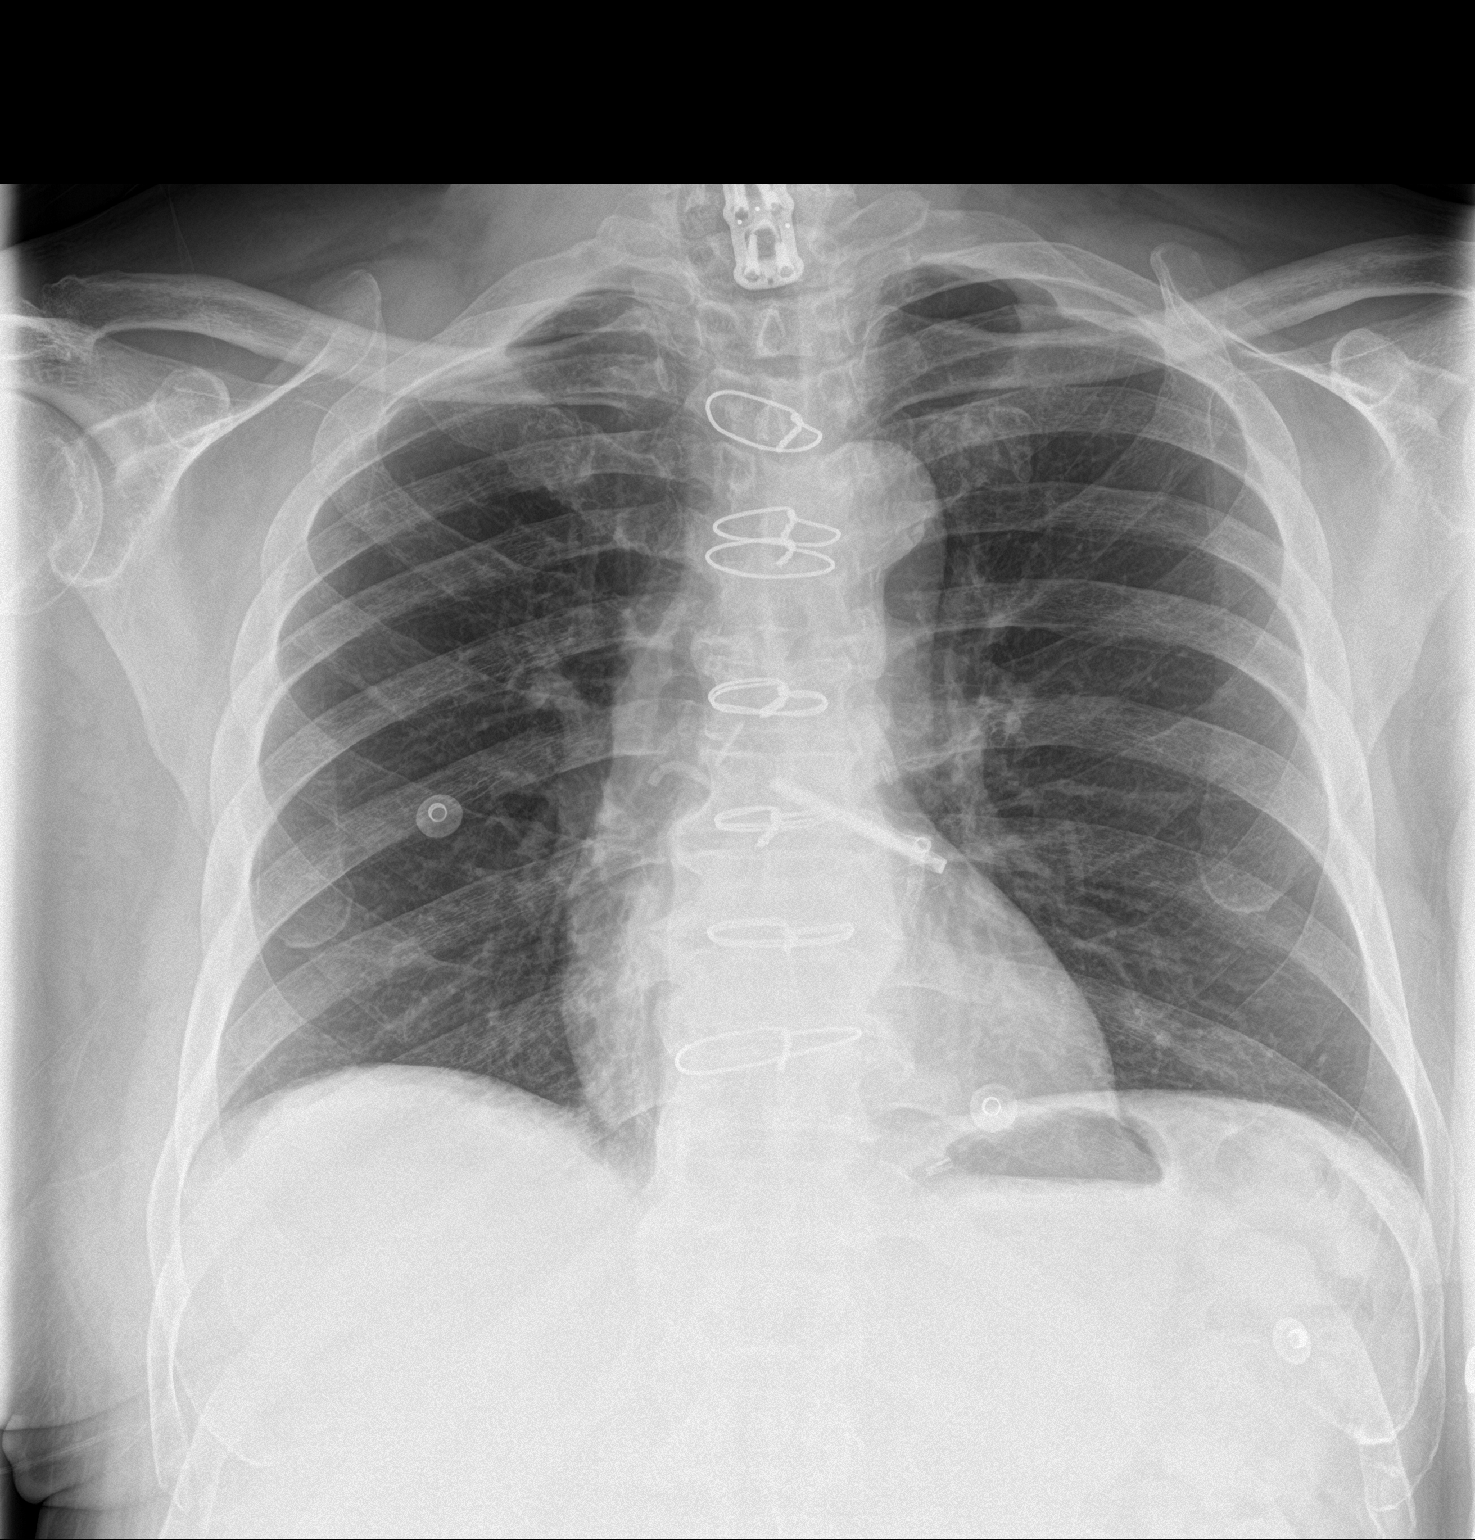

[2 of 2 positions shown; findings below may reference images not displayed]

FINDINGS: Frontal and lateral views of the chest demonstrates stable
postsurgical changes from CABG. The cardiac silhouette is
unremarkable. No acute airspace disease, effusion, or pneumothorax.
No acute bony abnormalities.
IMPRESSION: 1. No acute intrathoracic process.

## 2022-10-07 NOTE — Progress Notes (Signed)
Physical Therapy Treatment Patient Details Name: Michael [name] MRN: [id number] DOB: 1951-07-09 Today's Date: 10/07/2022   History of Present Illness 72 y.o. male admitted 09/09/22 with chest pain, AKI on CKD stage IIIb, Suicidal ideations in the setting of depression. Recently hospitalized 11/30-12/1/23. PMHx:HTN, chronic HFrEF with recovered LVEF 60 to 65%, PAF on Eliquis, CAD s/p CABG and stenting, DM, CKD stage IIIb, HIV on HAART, and cocaine abuse.    PT Comments    STAR PT session: Pt agreeable to therapy today, with encouragement agreeable to continue to work with [facility]. Pt requires cuing to remember proper sequencing pattern, able to achieve but then require reminding again after making turn in hallway. Pt min A for steadying through out. Pt reports increasing dizziness and nausea with distance. PT and pt forgot to don occlusion glasses prior to therapy. By the time pt returned to room request return to bed. Continue to work toward independent ambulation.     Recommendations for follow up therapy are one component of a multi-disciplinary discharge planning process, led by the attending physician.  Recommendations may be updated based on patient status, additional functional criteria and insurance authorization.  Follow Up Recommendations  Other (comment) (drug rehab) Can patient physically be transported by private vehicle: Yes   Assistance Recommended at Discharge Set up Supervision/Assistance  Patient can return home with the following A little help with walking and/or transfers;A little help with bathing/dressing/bathroom;Direct supervision/assist for medications management;Direct supervision/assist for financial management;Assist for transportation;Assistance with cooking/housework   Equipment Recommendations  Other (comment) (upright rollator)    Recommendations for Other Services Other (comment)     Precautions / Restrictions Precautions Precautions:  Fall Restrictions Weight Bearing Restrictions: No     Mobility  Bed Mobility Overal bed mobility: Needs Assistance Bed Mobility: Sit to Supine     Supine to sit: Supervision Sit to supine: Supervision   General bed mobility comments: supervision for safety    Transfers Overall transfer level: Needs assistance Equipment used: Straight cane Transfers: Sit to/from Stand Sit to Stand: Min assist           General transfer comment: attempted to come to standing without UE support, encouraged use of cane to assist in power up and requires light min A for steadying once in standing    Ambulation/Gait Ambulation/Gait assistance: Min assist Gait Distance (Feet): 80 Feet Assistive device: Straight cane Gait Pattern/deviations: Step-through pattern, Decreased step length - right, Decreased step length - left Gait velocity: slowed Gait velocity interpretation: <1.31 ft/sec, indicative of household ambulator   General Gait Details: min A for steadying, pt requires vc for sequencing, reporting he had forgotten what to do since last session, once sequence establish able to maintain until pt needs to turn, requires cuing for turn and for resuming sequencing. Pt reports increased dizziness. Pt and PT forgot to don occlusion glasses, by the time pt return to room dizzy and nauseated and request return to bed.       Balance Overall balance assessment: Needs assistance Sitting-balance support: No upper extremity supported, Feet supported Sitting balance-Leahy Scale: Good     Standing balance support: No upper extremity supported, During functional activity Standing balance-Leahy Scale: Fair                              Cognition Arousal/Alertness: Awake/alert Behavior During Therapy: WFL for tasks assessed/performed Overall Cognitive Status: Within Functional Limits for tasks assessed  General Comments  General comments (skin integrity, edema, etc.): forgot occlusion glasses and became dizzy and nauseous      Pertinent Vitals/Pain Pain Assessment Pain Assessment: Faces Faces Pain Scale: Hurts little more Pain Location: back after ambulation Pain Descriptors / Indicators: Discomfort, Grimacing Pain Intervention(s): Limited activity within patient's tolerance, Monitored during session, Repositioned     PT Goals (current goals can now be found in the care plan section) Acute Rehab PT Goals PT Goal Formulation: With patient Time For Goal Achievement: 09/27/22 Potential to Achieve Goals: Good Progress towards PT goals: Progressing toward goals    Frequency    Min 3X/week      PT Plan Current plan remains appropriate       AM-PAC PT "6 Clicks" Mobility   Outcome Measure  Help needed turning from your back to your side while in a flat bed without using bedrails?: None Help needed moving from lying on your back to sitting on the side of a flat bed without using bedrails?: A Little Help needed moving to and from a bed to a chair (including a wheelchair)?: A Little Help needed standing up from a chair using your arms (e.g., wheelchair or bedside chair)?: A Little Help needed to walk in hospital room?: A Little Help needed climbing 3-5 steps with a railing? : A Lot 6 Click Score: 18    End of Session Equipment Utilized During Treatment: Gait belt Activity Tolerance: Other (comment) (limited by dizziness) Patient left: in bed;with call bell/phone within reach;with bed alarm set Nurse Communication: Mobility status PT Visit Diagnosis: Unsteadiness on feet (R26.81);Other abnormalities of gait and mobility (R26.89);History of falling (Z91.81);Muscle weakness (generalized) (M62.81);Difficulty in walking, not elsewhere classified (R26.2)     Time: 6226-3335 PT Time Calculation (min) (ACUTE ONLY): 16 min  Charges:  $Gait Training: 8-22 mins                     Michael [name] B. Migdalia Dk PT, DPT Acute Rehabilitation Services Please use secure chat or  Call Office [phone]    [place] 10/07/2022, 3:55 PM

## 2022-10-07 NOTE — Plan of Care (Signed)
  Problem: Activity: Goal: Ability to tolerate increased activity will improve Outcome: Progressing   Problem: Cardiac: Goal: Ability to achieve and maintain adequate cardiovascular perfusion will improve Outcome: Progressing   Problem: Coping: Goal: Ability to adjust to condition or change in health will improve Outcome: Progressing   Problem: Fluid Volume: Goal: Ability to maintain a balanced intake and output will improve Outcome: Progressing   Problem: Nutritional: Goal: Maintenance of adequate nutrition will improve Outcome: Progressing   Problem: Skin Integrity: Goal: Risk for impaired skin integrity will decrease Outcome: Progressing   Problem: Activity: Goal: Risk for activity intolerance will decrease Outcome: Progressing

## 2022-10-07 NOTE — Progress Notes (Signed)
PROGRESS NOTE    Michael [name]  [id number] DOB: 1951/08/25 DOA: 09/09/2022 PCP: [name], MD   Brief Narrative: Michael [name] is a 72 y.o. male with a history of hypertension, chronic heart failure, paroxysmal atrial fibrillation, CAD s/p CABG/PCI, CKD, HIV, cocaine abuse. [name]. Admission complicated by significant AKI that responded to IV fluids. Discharge complicated by inability to place patient at a SNF.    Assessment and Plan: Acute kidney injury superimposed on chronic kidney disease ([place]) Creatinine of 6.32 on admission. Associated BUN of 81. Patient initially started on IV fluids with improvement of renal function towards baseline. Although In/Out show overall net positive, weight has significantly declined since admission. Creatinine continues to rise with cessation of IV fluids. Discussed with patient to keep well hydrated. -BMP in AM  Essential hypertension -Continue diltiazem, hydralazine, and Imdur  Paroxysmal A-fib ([name]) Patient is not on anticoagulation secondary to history of high bleeding risk -Continue diltiazem  (HFpEF) heart failure with preserved ejection fraction ([name]) Stable. Not on diuretic therapy.  HIV (human immunodeficiency virus infection) ([place]) -Continue Juluca  History of pulmonary embolism Noted. Not on anticoagulation.  Type 2 diabetes mellitus with hyperlipidemia ([name]) Hemoglobin A1C of 7.7%. Adequately controlled for age.   Polysubstance abuse ([place]) Noted. History of cocaine use.  Suicidal ideations Evaluated by psychiatry. No role for inpatient admission. -Continue trazodone and fluoxetine  COPD (chronic obstructive pulmonary disease) ([name]) -Continue Incruse Ellipta, Dulera and Duoneb PRN  Hyperkalemia Likely secondary to kidney disease. Improved with IV fluids and improvement of renal function  CKD (chronic kidney disease), stage IV  ([name]) Baseline serum creatinine appears to be around 2.2-2.4. Patient will need nephrology follow-up as an outpatient.  Back pain Patient appears to have a history of lumbar spinal stenosis. He states a history of back surgery but is unable to tell me what was performed. Lumbar x-ray without acute fracture. Pain improved with Norco. -Continue Norco PRN   DVT prophylaxis: Heparin subq Code Status:   Code Status: Full Code Family Communication: None at bedside Disposition Plan: Discharge pending bed availability vs improvement of functional ability to go home   Consultants:  Psychiatry Infectious disease  Procedures:    Antimicrobials:     Subjective: No issues this morning. Asking about his kidney function and plan for kidney disease going forward.  Objective: BP 139/72 (BP Location: Right Arm)   Pulse 66   Temp 98 F (36.7 C) (Oral)   Resp 19   Ht '5\' 8"'$  (1.727 m)   Wt 76 kg   SpO2 100%   BMI 25.48 kg/m   Examination:  General exam: Appears calm and comfortable. Lying in bed. Respiratory system: Respiratory effort normal.     Data Reviewed: I have personally reviewed following labs and imaging studies  CBC Lab Results  Component Value Date   WBC 4.6 10/03/2022   RBC 2.87 (L) 10/03/2022   HGB 8.2 (L) 10/03/2022   HCT 25.3 (L) 10/03/2022   MCV 88.2 10/03/2022   MCH 28.6 10/03/2022   PLT 145 (L) 10/03/2022   MCHC 32.4 10/03/2022   RDW 14.7 10/03/2022   LYMPHSABS 0.9 09/05/2022   MONOABS 0.5 09/05/2022   EOSABS 0.1 09/05/2022   BASOSABS 0.0 44/81/8563     Last metabolic panel Lab Results  Component Value Date   NA 138 10/07/2022   K 4.9 10/07/2022   CL 114 (H) 10/07/2022   CO2 21 (L)  10/07/2022   BUN 41 (H) 10/07/2022   CREATININE 3.53 (H) 10/07/2022   GLUCOSE 124 (H) 10/07/2022   GFRNONAA 18 (L) 10/07/2022   GFRAA 33 (L) 12/12/2019   CALCIUM 8.5 (L) 10/07/2022   PHOS 2.8 09/18/2022   PROT 7.8 09/09/2022   ALBUMIN 4.0 09/09/2022   LABGLOB  3.2 12/26/2016   AGRATIO 1.2 12/26/2016   BILITOT 0.5 09/09/2022   ALKPHOS 93 09/09/2022   AST 24 09/09/2022   ALT 27 09/09/2022   ANIONGAP 3 (L) 10/07/2022    GFR: Estimated Creatinine Clearance: 18.3 mL/min (A) (by C-G formula based on SCr of 3.53 mg/dL (H)).  No results found for this or any previous visit (from the past 240 hour(s)).    Radiology Studies: No results found.    LOS: 27 days    Cordelia Poche, MD [facility] 10/07/2022, 12:41 PM   If 7PM-7AM, please contact night-coverage [email or web address]

## 2022-10-08 DIAGNOSIS — I480 Paroxysmal atrial fibrillation: Secondary | ICD-10-CM | POA: Diagnosis not present

## 2022-10-08 DIAGNOSIS — I5032 Chronic diastolic (congestive) heart failure: Secondary | ICD-10-CM | POA: Diagnosis not present

## 2022-10-08 DIAGNOSIS — N179 Acute kidney failure, unspecified: Secondary | ICD-10-CM | POA: Diagnosis not present

## 2022-10-08 DIAGNOSIS — I10 Essential (primary) hypertension: Secondary | ICD-10-CM | POA: Diagnosis not present

## 2022-10-08 LAB — BASIC METABOLIC PANEL
Anion gap: 6 (ref 5–15)
BUN: 41 mg/dL — ABNORMAL HIGH (ref 8–23)
CO2: 20 mmol/L — ABNORMAL LOW (ref 22–32)
Calcium: 8.5 mg/dL — ABNORMAL LOW (ref 8.9–10.3)
Chloride: 113 mmol/L — ABNORMAL HIGH (ref 98–111)
Creatinine, Ser: 3.57 mg/dL — ABNORMAL HIGH (ref 0.61–1.24)
GFR, Estimated: 17 mL/min — ABNORMAL LOW (ref >60)
Glucose, Bld: 100 mg/dL — ABNORMAL HIGH (ref 70–99)
Potassium: 5.2 mmol/L — ABNORMAL HIGH (ref 3.5–5.1)
Sodium: 139 mmol/L (ref 135–145)

## 2022-10-08 NOTE — Progress Notes (Addendum)
PROGRESS NOTE    Michael [name]  [id number] DOB: 02-Apr-1951 DOA: 09/09/2022 PCP: [name], MD   Brief Narrative: Michael [name] is a 72 y.o. male with a history of hypertension, chronic heart failure, paroxysmal atrial fibrillation, CAD s/p CABG/PCI, CKD, HIV, cocaine abuse. [name]. Admission complicated by significant AKI that responded to IV fluids. Discharge complicated by inability to place patient at a SNF.    Assessment and Plan: Acute kidney injury superimposed on chronic kidney disease ([place]) Creatinine of 6.32 on admission. Associated BUN of 81. Patient initially started on IV fluids with improvement of renal function towards baseline. Although In/Out show overall net positive, weight has significantly declined since admission. Creatinine continues to rise with cessation of IV fluids. Discussed with patient to keep well hydrated. BUN/creatinine seem stable. -BMP in AM  Essential hypertension -Continue diltiazem, hydralazine, and Imdur  Paroxysmal A-fib ([name]) Patient is not on anticoagulation secondary to history of high bleeding risk -Continue diltiazem  (HFpEF) heart failure with preserved ejection fraction ([name]) Stable. Not on diuretic therapy.  HIV (human immunodeficiency virus infection) ([place]) -Continue Juluca  History of pulmonary embolism Noted. Not on anticoagulation.  Type 2 diabetes mellitus with hyperlipidemia ([name]) Hemoglobin A1C of 7.7%. Adequately controlled for age.   Polysubstance abuse ([place]) Noted. History of cocaine use.  Suicidal ideations Evaluated by psychiatry. No role for inpatient admission. -Continue trazodone and fluoxetine  COPD (chronic obstructive pulmonary disease) ([name]) -Continue Incruse Ellipta, Dulera and Duoneb PRN  Hyperkalemia Likely secondary to kidney disease. -Repeat BMP in AM  CKD (chronic kidney disease), stage IV ([name]) Baseline  serum creatinine appears to be around 2.2-2.4. Patient will need nephrology follow-up as an outpatient.  Back pain Patient appears to have a history of lumbar spinal stenosis. He states a history of back surgery but is unable to tell me what was performed. Lumbar x-ray without acute fracture. Pain improved with Norco. -Continue Norco PRN   DVT prophylaxis: Heparin subq Code Status:   Code Status: Full Code Family Communication: None at bedside Disposition Plan: Discharge pending bed availability vs improvement of functional ability to go home   Consultants:  Psychiatry Infectious disease  Procedures:    Antimicrobials:     Subjective: No concerns this morning. Doing well.  Objective: BP (!) 148/73 (BP Location: Left Arm)   Pulse 67   Temp (!) 97.5 F (36.4 C) (Oral)   Resp 18   Ht '5\' 8"'$  (1.727 m)   Wt 76.5 kg   SpO2 97%   BMI 25.64 kg/m   Examination:  General exam: Appears calm and comfortable. Sitting in chair. Respiratory system: Respiratory effort normal. Psychiatry: Judgement and insight appear normal. Mood & affect appropriate.    Data Reviewed: I have personally reviewed following labs and imaging studies  CBC Lab Results  Component Value Date   WBC 4.6 10/03/2022   RBC 2.87 (L) 10/03/2022   HGB 8.2 (L) 10/03/2022   HCT 25.3 (L) 10/03/2022   MCV 88.2 10/03/2022   MCH 28.6 10/03/2022   PLT 145 (L) 10/03/2022   MCHC 32.4 10/03/2022   RDW 14.7 10/03/2022   LYMPHSABS 0.9 09/05/2022   MONOABS 0.5 09/05/2022   EOSABS 0.1 09/05/2022   BASOSABS 0.0 61/44/3154     Last metabolic panel Lab Results  Component Value Date   NA 139 10/08/2022   K 5.2 (H) 10/08/2022   CL 113 (H) 10/08/2022   CO2 20 (L)  10/08/2022   BUN 41 (H) 10/08/2022   CREATININE 3.57 (H) 10/08/2022   GLUCOSE 100 (H) 10/08/2022   GFRNONAA 17 (L) 10/08/2022   GFRAA 33 (L) 12/12/2019   CALCIUM 8.5 (L) 10/08/2022   PHOS 2.8 09/18/2022   PROT 7.8 09/09/2022   ALBUMIN 4.0  09/09/2022   LABGLOB 3.2 12/26/2016   AGRATIO 1.2 12/26/2016   BILITOT 0.5 09/09/2022   ALKPHOS 93 09/09/2022   AST 24 09/09/2022   ALT 27 09/09/2022   ANIONGAP 6 10/08/2022    GFR: Estimated Creatinine Clearance: 18.1 mL/min (A) (by C-G formula based on SCr of 3.57 mg/dL (H)).  No results found for this or any previous visit (from the past 240 hour(s)).    Radiology Studies: No results found.    LOS: 28 days    Cordelia Poche, MD [facility] 10/08/2022, 12:25 PM   If 7PM-7AM, please contact night-coverage [email or web address]

## 2022-10-08 NOTE — Progress Notes (Signed)
Occupational Therapy Treatment Patient Details Name: Michael [name] MRN: [id number] DOB: 07-28-1951 Today's Date: 10/08/2022   History of present illness 72 y.o. male admitted 09/09/22 with chest pain, AKI on CKD stage IIIb, Suicidal ideations in the setting of depression. Recently hospitalized 11/30-12/1/23. PMHx:HTN, chronic HFrEF with recovered LVEF 60 to 65%, PAF on Eliquis, CAD s/p CABG and stenting, DM, CKD stage IIIb, HIV on HAART, and cocaine abuse.   OT comments  [facility] Patient OT session. Patient eager to participate with OT with patient continues to be min guard for shower transfer due to having to step over 6 inch rise. Patient is setup to supervision for self care tasks with mild dizziness when standing. Mobility performed in hallway with platform rollator to increase activity tolerance and mobility. Acute OT to continue to follow with [facility] program.    Recommendations for follow up therapy are one component of a multi-disciplinary discharge planning process, led by the attending physician.  Recommendations may be updated based on patient status, additional functional criteria and insurance authorization.    Follow Up Recommendations  Other (comment) (drug rehab)     Assistance Recommended at Discharge Intermittent Supervision/Assistance  Patient can return home with the following  Assist for transportation;A little help with walking and/or transfers;A little help with bathing/dressing/bathroom   Equipment Recommendations  None recommended by OT    Recommendations for Other Services      Precautions / Restrictions Precautions Precautions: Fall Restrictions Weight Bearing Restrictions: No       Mobility Bed Mobility Overal bed mobility: Needs Assistance Bed Mobility: Sit to Supine     Supine to sit: Supervision     General bed mobility comments: supervision for safety    Transfers Overall transfer level: Needs assistance Equipment used: Rollator (4 wheels)  (platform) Transfers: Sit to/from Stand Sit to Stand: Min guard           General transfer comment: min guard from EOB and armless chair     Balance Overall balance assessment: Needs assistance Sitting-balance support: No upper extremity supported, Feet supported Sitting balance-Leahy Scale: Good     Standing balance support: No upper extremity supported, During functional activity Standing balance-Leahy Scale: Fair Standing balance comment: able to stand for self care tasks without UE support                           ADL either performed or assessed with clinical judgement   ADL Overall ADL's : Needs assistance/impaired     Grooming: Wash/dry hands;Wash/dry face;Oral care;Supervision/safety;Standing Grooming Details (indicate cue type and reason): at sink Upper Body Bathing: Supervision/ safety;Sitting Upper Body Bathing Details (indicate cue type and reason): in shower Lower Body Bathing: Sit to/from stand;Supervison/ safety Lower Body Bathing Details (indicate cue type and reason): supervision for safety in shower Upper Body Dressing : Set up;Sitting Upper Body Dressing Details (indicate cue type and reason): donned pullover top Lower Body Dressing: Set up;Sitting/lateral leans;Sit to/from stand Lower Body Dressing Details (indicate cue type and reason): setup for donning pants and socks         Tub/ Shower Transfer: Min guard;Walk-in shower;BSC/3in1;Grab bars Tub/Shower Transfer Details (indicate cue type and reason): min guard for safety when stepping over 6 inch rise into shower        Extremity/Trunk Assessment              Vision       Perception     Praxis  Cognition Arousal/Alertness: Awake/alert Behavior During Therapy: WFL for tasks assessed/performed Overall Cognitive Status: Within Functional Limits for tasks assessed                                          Exercises      Shoulder Instructions        General Comments      Pertinent Vitals/ Pain       Pain Assessment Pain Assessment: Faces Faces Pain Scale: Hurts little more Pain Location: back after ambulation Pain Descriptors / Indicators: Discomfort, Grimacing Pain Intervention(s): Monitored during session, Repositioned, Limited activity within patient's tolerance, Heat applied  Home Living                                          Prior Functioning/Environment              Frequency  Min 5X/week        Progress Toward Goals  OT Goals(current goals can now be found in the care plan section)  Progress towards OT goals: Progressing toward goals  Acute Rehab OT Goals Patient Stated Goal: get better OT Goal Formulation: With patient Time For Goal Achievement: 10/15/22 Potential to Achieve Goals: Good ADL Goals Pt Will Perform Grooming: with supervision;standing Pt Will Perform Lower Body Dressing: with modified independence;sit to/from stand Pt Will Transfer to Toilet: with modified independence;ambulating Pt Will Perform Tub/Shower Transfer: Shower transfer;with modified independence;3 in 1 Pt/caregiver will Perform Home Exercise Program: Increased strength;Both right and left upper extremity;With written HEP provided;Independently Additional ADL Goal #1: Pt will independently verbalize 3 strategies to reduce risk of falls  Plan Discharge plan remains appropriate;Frequency remains appropriate    Co-evaluation                 AM-PAC OT "6 Clicks" Daily Activity     Outcome Measure   Help from another person eating meals?: None Help from another person taking care of personal grooming?: A Little Help from another person toileting, which includes using toliet, bedpan, or urinal?: A Little Help from another person bathing (including washing, rinsing, drying)?: A Little Help from another person to put on and taking off regular upper body clothing?: A Little Help from another  person to put on and taking off regular lower body clothing?: A Little 6 Click Score: 19    End of Session Equipment Utilized During Treatment: Rollator (4 wheels);Gait belt;Other (comment) (platform rollator)  OT Visit Diagnosis: Unsteadiness on feet (R26.81);Muscle weakness (generalized) (M62.81)   Activity Tolerance Patient tolerated treatment well   Patient Left in chair;with call bell/phone within reach;with chair alarm set   Nurse Communication Mobility status        Time: [phone] OT Time Calculation (min): 50 min  Charges: OT General Charges $OT Visit: 1 Visit OT Treatments $Self Care/Home Management : 23-37 mins $Therapeutic Activity: 8-22 mins  Lodema Hong, [place]  Office [phone]   Trixie Dredge 10/08/2022, 9:04 AM

## 2022-10-08 NOTE — Progress Notes (Signed)
Mobility Specialist Progress Note:    10/08/22 1120  Mobility  Activity Ambulated with assistance in hallway  Level of Assistance Contact guard assist, steadying assist  Assistive Device Four wheel walker (Upright)  Distance Ambulated (ft) 150 ft  Activity Response Tolerated well  Mobility Referral Yes  $Mobility charge 1 Mobility   Pt was agreeable for mobility session. C/o dizziness throughout. Left pt in bed with all needs met.   Royetta Crochet Mobility Specialist Please contact via Solicitor or  Rehab office at [phone]

## 2022-10-08 NOTE — Progress Notes (Signed)
Physical Therapy Treatment Patient Details Name: Michael [name] MRN: [id number] DOB: 1951/03/16 Today's Date: 10/08/2022   History of Present Illness 72 y.o. male admitted 09/09/22 with chest pain, AKI on CKD stage IIIb, Suicidal ideations in the setting of depression. Recently hospitalized 11/30-12/1/23. PMHx:HTN, chronic HFrEF with recovered LVEF 60 to 65%, PAF on Eliquis, CAD s/p CABG and stenting, DM, CKD stage IIIb, HIV on HAART, and cocaine abuse.    PT Comments    [facility] PT Session: Pt is supine in bed on entry, but eager to get up and work with therapy. Continue to work on [facility] training. Pt wore occlusion glasses today with mild relief of dizziness and nausea. Tried to incorporate frequent rest breaks and gaze stabilization with limited success. Pt committed to continued work in this area. Working towards independence with his ambulation with SPC.     Recommendations for follow up therapy are one component of a multi-disciplinary discharge planning process, led by the attending physician.  Recommendations may be updated based on patient status, additional functional criteria and insurance authorization.  Follow Up Recommendations  Other (comment) (drug rehab) Can patient physically be transported by private vehicle: Yes   Assistance Recommended at Discharge Set up Supervision/Assistance  Patient can return home with the following A little help with walking and/or transfers;A little help with bathing/dressing/bathroom;Direct supervision/assist for medications management;Direct supervision/assist for financial management;Assist for transportation;Assistance with cooking/housework   Equipment Recommendations  Other (comment) (upright rollator)    Recommendations for Other Services Other (comment)     Precautions / Restrictions Precautions Precautions: Fall Restrictions Weight Bearing Restrictions: No     Mobility  Bed Mobility Overal bed mobility: Needs Assistance Bed  Mobility: Sit to Supine     Supine to sit: Supervision     General bed mobility comments: supervision for safety    Transfers Overall transfer level: Needs assistance Equipment used: Straight cane Transfers: Sit to/from Stand Sit to Stand: Min guard           General transfer comment: min guard from bed, vc for use of bed rail and SPC, min guard for 2x standing from straight back chair with use of handrail in hallway and SPC.    Ambulation/Gait Ambulation/Gait assistance: Min assist   Assistive device: Straight cane Gait Pattern/deviations: Step-through pattern, Decreased step length - right, Decreased step length - left Gait velocity: slowed Gait velocity interpretation: <1.31 ft/sec, indicative of household ambulator   General Gait Details: contact guard assist, as pt reports feelsing safer, only 1x during entire session requiring minA for steadying after LoB, attempted to have more seated rest breaks to reduce onset of dizziness and blurry vision. Pt has some minor relief with gaze stabilization techniques but still has nausea at end of session      Balance Overall balance assessment: Needs assistance Sitting-balance support: No upper extremity supported, Feet supported Sitting balance-Leahy Scale: Good     Standing balance support: No upper extremity supported, During functional activity Standing balance-Leahy Scale: Fair                              Cognition Arousal/Alertness: Awake/alert Behavior During Therapy: WFL for tasks assessed/performed Overall Cognitive Status: Within Functional Limits for tasks assessed  General Comments General comments (skin integrity, edema, etc.): used occlusion glasses and gaze stabilization techniques      Pertinent Vitals/Pain Pain Assessment Pain Assessment: Faces Faces Pain Scale: Hurts little more Pain Location: back after ambulation Pain  Descriptors / Indicators: Discomfort, Grimacing Pain Intervention(s): Heat applied, Limited activity within patient's tolerance, Monitored during session, Repositioned     PT Goals (current goals can now be found in the care plan section) Acute Rehab PT Goals PT Goal Formulation: With patient Time For Goal Achievement: 09/27/22 Potential to Achieve Goals: Good Progress towards PT goals: Progressing toward goals    Frequency    Min 3X/week      PT Plan Current plan remains appropriate       AM-PAC PT "6 Clicks" Mobility   Outcome Measure  Help needed turning from your back to your side while in a flat bed without using bedrails?: None Help needed moving from lying on your back to sitting on the side of a flat bed without using bedrails?: A Little Help needed moving to and from a bed to a chair (including a wheelchair)?: A Little Help needed standing up from a chair using your arms (e.g., wheelchair or bedside chair)?: A Little Help needed to walk in hospital room?: A Little Help needed climbing 3-5 steps with a railing? : A Lot 6 Click Score: 18    End of Session Equipment Utilized During Treatment: Gait belt Activity Tolerance: Other (comment) (limited by dizziness) Patient left: in bed;with call bell/phone within reach;with bed alarm set Nurse Communication: Mobility status PT Visit Diagnosis: Unsteadiness on feet (R26.81);Other abnormalities of gait and mobility (R26.89);History of falling (Z91.81);Muscle weakness (generalized) (M62.81);Difficulty in walking, not elsewhere classified (R26.2)     Time: 6073-7106 PT Time Calculation (min) (ACUTE ONLY): 30 min  Charges:  $Gait Training: 23-37 mins                     Johannes Everage B. Migdalia Dk PT, DPT Acute Rehabilitation Services Please use secure chat or  Call Office [phone]    [place] 10/08/2022, 3:56 PM

## 2022-10-09 DIAGNOSIS — N179 Acute kidney failure, unspecified: Secondary | ICD-10-CM | POA: Diagnosis not present

## 2022-10-09 DIAGNOSIS — N189 Chronic kidney disease, unspecified: Secondary | ICD-10-CM | POA: Diagnosis not present

## 2022-10-09 LAB — BASIC METABOLIC PANEL
Anion gap: 7 (ref 5–15)
BUN: 38 mg/dL — ABNORMAL HIGH (ref 8–23)
CO2: 19 mmol/L — ABNORMAL LOW (ref 22–32)
Calcium: 8.9 mg/dL (ref 8.9–10.3)
Chloride: 114 mmol/L — ABNORMAL HIGH (ref 98–111)
Creatinine, Ser: 3.3 mg/dL — ABNORMAL HIGH (ref 0.61–1.24)
GFR, Estimated: 19 mL/min — ABNORMAL LOW (ref >60)
Glucose, Bld: 97 mg/dL (ref 70–99)
Potassium: 5 mmol/L (ref 3.5–5.1)
Sodium: 140 mmol/L (ref 135–145)

## 2022-10-09 LAB — GLUCOSE, CAPILLARY: Glucose-Capillary: 194 mg/dL — ABNORMAL HIGH (ref 70–99)

## 2022-10-09 NOTE — Progress Notes (Signed)
Mobility Specialist Progress Note:    10/09/22 1100  Mobility  Activity Ambulated with assistance in hallway  Level of Assistance Contact guard assist, steadying assist  Assistive Device Front wheel walker  Distance Ambulated (ft) 312 ft  Activity Response Tolerated well  Mobility Referral Yes  $Mobility charge 1 Mobility   Pt was eager for mobility. Tolerated well and was asx throughout. Left pt in bed with alarm on and all needs met.   Royetta Crochet Mobility Specialist Please contact via Solicitor or  Rehab office at [phone]

## 2022-10-09 NOTE — Progress Notes (Signed)
Occupational Therapy Treatment Patient Details Name: Michael [name] MRN: [id number] DOB: 1951-07-14 Today's Date: 10/09/2022   History of present illness 72 y.o. male admitted 09/09/22 with chest pain, AKI on CKD stage IIIb, Suicidal ideations in the setting of depression. Recently hospitalized 11/30-12/1/23. PMHx:HTN, chronic HFrEF with recovered LVEF 60 to 65%, PAF on Eliquis, CAD s/p CABG and stenting, DM, CKD stage IIIb, HIV on HAART, and cocaine abuse.   OT comments  [facility] Program OT session: Patient continues to make good progress with OT treatment. Patient able to ambulate to bathroom with straight cane and perform toilet transfer with min guard for safety and was setup for grooming standing at sink. Patient performed mobility in hallway with platform rollator to increase activity tolerance. Patient to continue to progress towards MI for self care and will continue to be followed by acute OT with [facility] program.    Recommendations for follow up therapy are one component of a multi-disciplinary discharge planning process, led by the attending physician.  Recommendations may be updated based on patient status, additional functional criteria and insurance authorization.    Follow Up Recommendations  Other (comment) (drug rehab)     Assistance Recommended at Discharge Intermittent Supervision/Assistance  Patient can return home with the following  Assist for transportation;A little help with walking and/or transfers;A little help with bathing/dressing/bathroom   Equipment Recommendations  None recommended by OT    Recommendations for Other Services      Precautions / Restrictions Precautions Precautions: Fall Restrictions Weight Bearing Restrictions: No       Mobility Bed Mobility Overal bed mobility: Needs Assistance Bed Mobility: Sit to Supine     Supine to sit: Supervision     General bed mobility comments: supervision for safety    Transfers Overall transfer  level: Needs assistance Equipment used: Straight cane Transfers: Sit to/from Stand, Bed to chair/wheelchair/BSC Sit to Stand: Min guard           General transfer comment: min guard for mobility in room and toilet transfers with use of straight cane to simulate home environment     Balance Overall balance assessment: Needs assistance Sitting-balance support: No upper extremity supported, Feet supported Sitting balance-Leahy Scale: Good     Standing balance support: No upper extremity supported, During functional activity Standing balance-Leahy Scale: Fair Standing balance comment: able to stand for self care tasks without UE support                           ADL either performed or assessed with clinical judgement   ADL Overall ADL's : Needs assistance/impaired     Grooming: Wash/dry hands;Wash/dry face;Oral care;Standing;Set up Grooming Details (indicate cue type and reason): at sink                 Toilet Transfer: Min guard;Ambulation;Regular Toilet;Grab bars Toilet Transfer Details (indicate cue type and reason): patient ambulated to bathroom with straight cane and min guard assist for safety Toileting- Clothing Manipulation and Hygiene: Modified independent;Sitting/lateral lean         General ADL Comments: mobility in room performed with straight cane    Extremity/Trunk Assessment              Vision       Perception     Praxis      Cognition Arousal/Alertness: Awake/alert Behavior During Therapy: WFL for tasks assessed/performed Overall Cognitive Status: Within Functional Limits for tasks assessed  Exercises Exercises: General Upper Extremity General Exercises - Upper Extremity Shoulder Flexion: Strengthening, Both, 15 reps, Seated, Theraband Theraband Level (Shoulder Flexion): Level 2 (Red) Shoulder ABduction: Strengthening, Both, 15 reps, Seated, Theraband Theraband  Level (Shoulder Abduction): Level 2 (Red) Elbow Flexion: Strengthening, Both, 15 reps, Seated, Theraband Theraband Level (Elbow Flexion): Level 2 (Red) Elbow Extension: Strengthening, Both, 15 reps, Seated, Theraband Theraband Level (Elbow Extension): Level 2 (Red)    Shoulder Instructions       General Comments      Pertinent Vitals/ Pain       Pain Assessment Pain Assessment: Faces Faces Pain Scale: Hurts little more Pain Location: back after ambulation Pain Descriptors / Indicators: Discomfort, Grimacing Pain Intervention(s): Monitored during session, Repositioned, Heat applied  Home Living                                          Prior Functioning/Environment              Frequency  Min 5X/week        Progress Toward Goals  OT Goals(current goals can now be found in the care plan section)  Progress towards OT goals: Progressing toward goals  Acute Rehab OT Goals Patient Stated Goal: get better OT Goal Formulation: With patient Time For Goal Achievement: 10/15/22 Potential to Achieve Goals: Good ADL Goals Pt Will Perform Grooming: with supervision;standing Pt Will Perform Lower Body Dressing: with modified independence;sit to/from stand Pt Will Transfer to Toilet: with modified independence;ambulating Pt Will Perform Tub/Shower Transfer: Shower transfer;with modified independence;3 in 1 Pt/caregiver will Perform Home Exercise Program: Increased strength;Both right and left upper extremity;With written HEP provided;Independently Additional ADL Goal #1: Pt will independently verbalize 3 strategies to reduce risk of falls  Plan Discharge plan remains appropriate;Frequency remains appropriate    Co-evaluation                 AM-PAC OT "6 Clicks" Daily Activity     Outcome Measure   Help from another person eating meals?: None Help from another person taking care of personal grooming?: A Little Help from another person toileting,  which includes using toliet, bedpan, or urinal?: A Little Help from another person bathing (including washing, rinsing, drying)?: A Little Help from another person to put on and taking off regular upper body clothing?: A Little Help from another person to put on and taking off regular lower body clothing?: A Little 6 Click Score: 19    End of Session Equipment Utilized During Treatment: Gait belt;Rollator (4 wheels);Other (comment) (platform rollator and straight cane)  OT Visit Diagnosis: Unsteadiness on feet (R26.81);Muscle weakness (generalized) (M62.81)   Activity Tolerance Patient tolerated treatment well   Patient Left in chair;with call bell/phone within reach;with chair alarm set   Nurse Communication Mobility status        Time: 0923-3007 OT Time Calculation (min): 45 min  Charges: OT General Charges $OT Visit: 1 Visit OT Treatments $Self Care/Home Management : 8-22 mins $Therapeutic Activity: 8-22 mins $Therapeutic Exercise: 8-22 mins  Lodema Hong, [place]  Office [place] 10/09/2022, 8:33 AM

## 2022-10-09 NOTE — Progress Notes (Signed)
AKI on CKD stage IIIb PROGRESS NOTE    Michael [name]  OIZ:[id number] DOB: Jun 27, 1951 DOA: 09/09/2022 PCP: [name], MD   Brief Narrative:  This 72 y.o. male with a history of hypertension, chronic heart failure, paroxysmal atrial fibrillation, CAD s/p CABG/PCI, CKD, HIV, cocaine abuse. Patient presented secondary to left sided chest pain and suicidal ideations. Admission complicated by significant AKI that responded to IV fluids. Discharge complicated by inability to place patient at  SNF.  Medically clear.   Assessment & Plan:   Active Problems:   Acute kidney injury superimposed on chronic kidney disease ([name])   Essential hypertension   Paroxysmal A-fib ([name])   (HFpEF) heart failure with preserved ejection fraction ([name])   HIV (human immunodeficiency virus infection) ([place])   History of pulmonary embolism   Type 2 diabetes mellitus with hyperlipidemia ([name])   Polysubstance abuse ([place])   Suicidal ideations   COPD (chronic obstructive pulmonary disease) ([name])   Back pain   CKD (chronic kidney disease), stage IV ([name])   Hyperkalemia   AKI on CKD stage IV: Creatinine of 6.32 on admission, BUN of 81. Patient initially started on IV fluids with improvement of renal function towards baseline.  Although In/Out show overall net positive, weight has significantly declined since admission.  Creatinine continues to rise with cessation of IV fluids. Discussed with patient to keep well hydrated. BUN/creatinine seem stable. Continue to monitor serum creatinine.  Essential Hypertension: Continue diltiazem, hydralazine, and Imdur.   Paroxysmal Atrial fibrillation: Patient is not on anticoagulation secondary to history of high bleeding risk. Heart rate is well-controlled.  Continue diltiazem.   Heart failure with preserved EF: Appears euvolemic.  Not on diuretic therapy.   HIV+ Continue [place].   History of pulmonary embolism: Not on anticoagulation.   Type 2 diabetes  mellitus with hyperlipidemia ([name]) Hemoglobin A1C of 7.7%. Adequately controlled for age.    Polysubstance abuse: Noted. History of cocaine use. Counseled to avoid subsequent use.   Suicidal ideations: Evaluated by psychiatry. No role for inpatient admission. Continue trazodone and fluoxetine. He denies further suicidal / Homicidal ideations.   COPD (chronic obstructive pulmonary disease) ([name]) Continue Incruse Ellipta, Dulera and Duoneb PRN   Hyperkalemia Likely secondary to kidney disease. -Repeat BMP in AM.   CKD (chronic kidney disease), stage IV ([name]) Baseline serum creatinine appears to be around 2.2-2.4. Patient will need nephrology follow-up as an outpatient.   Chronic back pain: Patient appears to have a history of lumbar spinal stenosis.  He states  history of back surgery but is unable to tell me what was performed. Lumbar x-ray without acute fracture. Pain improved with Norco. Continue Norco PRN     DVT prophylaxis:Heparin sq Code Status:  Full code. Family Communication:  No family at bed side. Disposition Plan:   Discharge pending bed availability versus improvement of functional ability to go home  Consultants:  Psychiatry Infectious diseases  Procedures:  Antimicrobials:  Subjective: Patient was seen and examined at bedside.  Overnight events noted. Patient reported doing better,  denies any specific symptoms.  Objective: Vitals:   10/08/22 2200 10/09/22 0630 10/09/22 0848 10/09/22 0907  BP: 139/79 136/78  138/69  Pulse: 65 69  72  Resp: '18 18  18  '$ Temp: 98 F (36.7 C) 98.1 F (36.7 C)  98 F (36.7 C)  TempSrc: Oral Oral  Oral  SpO2: 100% 100% 99% 100%  Weight:      Height:        Intake/Output  Summary (Last 24 hours) at 10/09/2022 1328 Last data filed at 10/09/2022 0900 Gross per 24 hour  Intake 720 ml  Output 1300 ml  Net -580 ml   Filed Weights   09/26/22 0351 09/28/22 2124 10/07/22 2056  Weight: 71.1 kg 76 kg 76.5 kg     Examination:  General exam: Appears comfortable, not in any acute distress. Respiratory system: Clear to auscultation. Respiratory effort normal.  RR 13 Cardiovascular system: S1 & S2 heard, regular rate and rhythm, no murmur. Gastrointestinal system: Abdomen is soft, non tender, non distended, BS+ Central nervous system: Alert and oriented x 3. No focal neurological deficits. Extremities: No edema, no cyanosis, no clubbing Skin: No rashes, lesions or ulcers Psychiatry: Judgement and insight appear normal. Mood & affect appropriate.     Data Reviewed: I have personally reviewed following labs and imaging studies  CBC: Recent Labs  Lab 10/03/22 0800  WBC 4.6  HGB 8.2*  HCT 25.3*  MCV 88.2  PLT 824*   Basic Metabolic Panel: Recent Labs  Lab 10/05/22 0418 10/05/22 0911 10/06/22 0316 10/07/22 0350 10/08/22 0518 10/09/22 0418  NA 142  --  141 138 139 140  K 5.5* 5.2* 5.2* 4.9 5.2* 5.0  CL 112*  --  110 114* 113* 114*  CO2 21*  --  20* 21* 20* 19*  GLUCOSE 95  --  119* 124* 100* 97  BUN 41*  --  42* 41* 41* 38*  CREATININE 3.57*  --  3.65* 3.53* 3.57* 3.30*  CALCIUM 8.5*  --  8.6* 8.5* 8.5* 8.9   GFR: Estimated Creatinine Clearance: 19.6 mL/min (A) (by C-G formula based on SCr of 3.3 mg/dL (H)). Liver Function Tests: No results for input(s): "AST", "ALT", "ALKPHOS", "BILITOT", "PROT", "ALBUMIN" in the last 168 hours. No results for input(s): "LIPASE", "AMYLASE" in the last 168 hours. No results for input(s): "AMMONIA" in the last 168 hours. Coagulation Profile: No results for input(s): "INR", "PROTIME" in the last 168 hours. Cardiac Enzymes: No results for input(s): "CKTOTAL", "CKMB", "CKMBINDEX", "TROPONINI" in the last 168 hours. BNP (last 3 results) No results for input(s): "PROBNP" in the last 8760 hours. HbA1C: No results for input(s): "HGBA1C" in the last 72 hours. CBG: No results for input(s): "GLUCAP" in the last 168 hours. Lipid Profile: No results  for input(s): "CHOL", "HDL", "LDLCALC", "TRIG", "CHOLHDL", "LDLDIRECT" in the last 72 hours. Thyroid Function Tests: No results for input(s): "TSH", "T4TOTAL", "FREET4", "T3FREE", "THYROIDAB" in the last 72 hours. Anemia Panel: No results for input(s): "VITAMINB12", "FOLATE", "FERRITIN", "TIBC", "IRON", "RETICCTPCT" in the last 72 hours. Sepsis Labs: No results for input(s): "PROCALCITON", "LATICACIDVEN" in the last 168 hours.  No results found for this or any previous visit (from the past 240 hour(s)).   Radiology Studies: No results found.  Scheduled Meds:  aspirin  81 mg Oral Daily   atorvastatin  40 mg Oral Daily   clopidogrel  75 mg Oral Daily   diltiazem  120 mg Oral Daily   dolutegravir-rilpivirine  1 tablet Oral Q lunch   feeding supplement  237 mL Oral BID BM   FLUoxetine  20 mg Oral Daily   gabapentin  300 mg Oral BID   heparin  5,000 Units Subcutaneous Q8H   hydrALAZINE  50 mg Oral Q8H   isosorbide mononitrate  15 mg Oral Daily   lidocaine  1 patch Transdermal Q24H   mometasone-formoterol  2 puff Inhalation BID   multivitamin  1 tablet Oral QHS   polyethylene glycol  17 g Oral BID   senna  1 tablet Oral BID   sodium chloride flush  3 mL Intravenous Q12H   traZODone  50 mg Oral QHS   umeclidinium bromide  1 puff Inhalation Daily   Continuous Infusions:   LOS: 29 days    Time spent: 35 mins    Dutchess Crosland, MD [facility]   If 7PM-7AM, please contact night-coverage

## 2022-10-10 DIAGNOSIS — N189 Chronic kidney disease, unspecified: Secondary | ICD-10-CM | POA: Diagnosis not present

## 2022-10-10 DIAGNOSIS — N179 Acute kidney failure, unspecified: Secondary | ICD-10-CM | POA: Diagnosis not present

## 2022-10-10 LAB — BASIC METABOLIC PANEL
Anion gap: 7 (ref 5–15)
BUN: 44 mg/dL — ABNORMAL HIGH (ref 8–23)
CO2: 21 mmol/L — ABNORMAL LOW (ref 22–32)
Calcium: 8.6 mg/dL — ABNORMAL LOW (ref 8.9–10.3)
Chloride: 112 mmol/L — ABNORMAL HIGH (ref 98–111)
Creatinine, Ser: 3.52 mg/dL — ABNORMAL HIGH (ref 0.61–1.24)
GFR, Estimated: 18 mL/min — ABNORMAL LOW (ref >60)
Glucose, Bld: 106 mg/dL — ABNORMAL HIGH (ref 70–99)
Potassium: 4.6 mmol/L (ref 3.5–5.1)
Sodium: 140 mmol/L (ref 135–145)

## 2022-10-10 NOTE — Progress Notes (Signed)
Physical Therapy Treatment Patient Details Name: Michael [name] MRN: [id number] DOB: 1951/05/25 Today's Date: 10/10/2022   History of Present Illness 72 y.o. male admitted 09/09/22 with chest pain, AKI on CKD stage IIIb, Suicidal ideations in the setting of depression. Recently hospitalized 11/30-12/1/23. PMHx:HTN, chronic HFrEF with recovered LVEF 60 to 65%, PAF on Eliquis, CAD s/p CABG and stenting, DM, CKD stage IIIb, HIV on HAART, and cocaine abuse.    PT Comments    STAR PT Session: Continue to work with pt towards independence with [place]. Attempted to utilize more occlusion on glasses to decrease dizziness and nausea. However, pt still has onset of symptoms after about 40 feet of walking with SPC. Pt reports it usually takes about 30 minutes for the blurriness to decrease. Ambulated further with upright Rollator and pt with no increase in dizziness or nausea, but still present. Pt endorses more comfortable with Rollator, and ambulation is more fluid. Will continue to work on determining how to progress [facility] training with out onset of symptoms. Continue to work with Pharmacist, community on progressing distance ambulation with Rollator.     Recommendations for follow up therapy are one component of a multi-disciplinary discharge planning process, led by the attending physician.  Recommendations may be updated based on patient status, additional functional criteria and insurance authorization.  Follow Up Recommendations  Other (comment) (drug rehab) Can patient physically be transported by private vehicle: Yes   Assistance Recommended at Discharge Set up Supervision/Assistance  Patient can return home with the following A little help with walking and/or transfers;A little help with bathing/dressing/bathroom;Direct supervision/assist for medications management;Direct supervision/assist for financial management;Assist for transportation;Assistance with cooking/housework   Equipment  Recommendations  Other (comment) (upright rollator)    Recommendations for Other Services Other (comment)     Precautions / Restrictions Precautions Precautions: Fall Restrictions Weight Bearing Restrictions: No     Mobility  Bed Mobility Overal bed mobility: Needs Assistance Bed Mobility: Sit to Supine       Sit to supine: Supervision   General bed mobility comments: supervision for safety    Transfers Overall transfer level: Needs assistance Equipment used: Straight cane Transfers: Sit to/from Stand Sit to Stand: Min guard           General transfer comment: min guard from recliner with cane,  min guard for 2x standing from straight back chair with use of handrail in hallway to upright rollator .    Ambulation/Gait Ambulation/Gait assistance: Min assist, Min guard Gait Distance (Feet): 50 Feet (50 feet with upright Rollator) Assistive device: Straight cane, Rollator (4 wheels) Gait Pattern/deviations: Step-through pattern, Decreased step length - right, Decreased step length - left Gait velocity: slowed Gait velocity interpretation: <1.31 ft/sec, indicative of household ambulator   General Gait Details: contact guard assist, as pt reports feels safer during ambulation with SPC, again reports 6/10 nausea and dizziness,when switched to upright Rollator pt requiring only min guard, pt report decreased anxiety, still has nausea and request to return to bed       Balance Overall balance assessment: Needs assistance Sitting-balance support: No upper extremity supported, Feet supported Sitting balance-Leahy Scale: Good     Standing balance support: No upper extremity supported, During functional activity Standing balance-Leahy Scale: Fair                              Cognition Arousal/Alertness: Awake/alert Behavior During Therapy: WFL for tasks assessed/performed Overall Cognitive Status: Within Functional  Limits for tasks assessed                                              General Comments General comments (skin integrity, edema, etc.): attempted to occlude more of R eye      Pertinent Vitals/Pain Pain Assessment Pain Assessment: Faces Faces Pain Scale: Hurts little more Pain Location: back after ambulation Pain Descriptors / Indicators: Discomfort, Grimacing Pain Intervention(s): Limited activity within patient's tolerance, Monitored during session, Heat applied, Repositioned     PT Goals (current goals can now be found in the care plan section) Acute Rehab PT Goals PT Goal Formulation: With patient Time For Goal Achievement: 09/27/22 Potential to Achieve Goals: Good Progress towards PT goals: Progressing toward goals    Frequency    Min 3X/week      PT Plan Current plan remains appropriate       AM-PAC PT "6 Clicks" Mobility   Outcome Measure  Help needed turning from your back to your side while in a flat bed without using bedrails?: None Help needed moving from lying on your back to sitting on the side of a flat bed without using bedrails?: A Little Help needed moving to and from a bed to a chair (including a wheelchair)?: A Little Help needed standing up from a chair using your arms (e.g., wheelchair or bedside chair)?: A Little Help needed to walk in hospital room?: A Little Help needed climbing 3-5 steps with a railing? : A Lot 6 Click Score: 18    End of Session Equipment Utilized During Treatment: Gait belt Activity Tolerance: Other (comment) (limited by dizziness) Patient left: in bed;with call bell/phone within reach;with bed alarm set Nurse Communication: Mobility status PT Visit Diagnosis: Unsteadiness on feet (R26.81);Other abnormalities of gait and mobility (R26.89);History of falling (Z91.81);Muscle weakness (generalized) (M62.81);Difficulty in walking, not elsewhere classified (R26.2)     Time: 8546-2703 PT Time Calculation (min) (ACUTE ONLY): 31 min  Charges:   $Gait Training: 23-37 mins                     Rosy Estabrook B. Migdalia Dk PT, DPT Acute Rehabilitation Services Please use secure chat or  Call Office [phone]    [place] 10/10/2022, 5:00 PM

## 2022-10-10 NOTE — Progress Notes (Signed)
Occupational Therapy Treatment Patient Details Name: Michael [name] MRN: [id number] DOB: [birth year] Today's Date: 10/10/2022   History of present illness 72 y.o. male admitted 09/09/22 with chest pain, AKI on CKD stage IIIb, Suicidal ideations in the setting of depression. Recently hospitalized 11/30-12/1/23. PMHx:HTN, chronic HFrEF with recovered LVEF 60 to 65%, PAF on Eliquis, CAD s/p CABG and stenting, DM, CKD stage IIIb, HIV on HAART, and cocaine abuse.   OT comments  STAR Program OT session: Patient continues to progress and participate well with OT treatment. Patient performed dressing prep with clothing retrieval with straight cane and min guard assist. Patient continues to be able to perform dressing without physical assistance and supervision for safety. Patient performed mobility in hallway with platform rollator to increase activity tolerance. Acute OT to continue to follow with [facility].    Recommendations for follow up therapy are one component of a multi-disciplinary discharge planning process, led by the attending physician.  Recommendations may be updated based on patient status, additional functional criteria and insurance authorization.    Follow Up Recommendations  Other (comment) (drug rehab)     Assistance Recommended at Discharge Intermittent Supervision/Assistance  Patient can return home with the following  Assist for transportation;A little help with walking and/or transfers;A little help with bathing/dressing/bathroom   Equipment Recommendations  None recommended by OT    Recommendations for Other Services      Precautions / Restrictions Precautions Precautions: Fall Restrictions Weight Bearing Restrictions: No       Mobility Bed Mobility Overal bed mobility: Needs Assistance Bed Mobility: Sit to Supine     Supine to sit: Supervision     General bed mobility comments: supervision for safety    Transfers Overall transfer level: Needs  assistance Equipment used: Straight cane Transfers: Sit to/from Stand, Bed to chair/wheelchair/BSC Sit to Stand: Min guard           General transfer comment: patient used straight cane form mobility in room     Balance Overall balance assessment: Needs assistance Sitting-balance support: No upper extremity supported, Feet supported Sitting balance-Leahy Scale: Good     Standing balance support: No upper extremity supported, During functional activity Standing balance-Leahy Scale: Fair Standing balance comment: able to stand for self care tasks without UE support                           ADL either performed or assessed with clinical judgement   ADL Overall ADL's : Needs assistance/impaired Eating/Feeding: Set up;Sitting   Grooming: Wash/dry hands;Wash/dry face;Oral care;Applying deodorant;Supervision/safety;Standing Grooming Details (indicate cue type and reason): at sink         Upper Body Dressing : Sitting;Supervision/safety Upper Body Dressing Details (indicate cue type and reason): donned pullover top Lower Body Dressing: Supervision/safety;Sit to/from stand Lower Body Dressing Details (indicate cue type and reason): donned clothing seated on EOB and stands to pull up clothing               General ADL Comments: patient participated with setup for dressing with patient retrieving clothing from bathroom with single point cane and min guard assist    Extremity/Trunk Assessment              Vision       Perception     Praxis      Cognition Arousal/Alertness: Awake/alert Behavior During Therapy: WFL for tasks assessed/performed Overall Cognitive Status: Within Functional Limits for tasks assessed  Exercises      Shoulder Instructions       General Comments      Pertinent Vitals/ Pain       Pain Assessment Pain Assessment: Faces Faces Pain Scale: Hurts little  more Pain Location: back after ambulation Pain Descriptors / Indicators: Discomfort, Grimacing Pain Intervention(s): Monitored during session, Repositioned, Heat applied  Home Living                                          Prior Functioning/Environment              Frequency  Min 5X/week        Progress Toward Goals  OT Goals(current goals can now be found in the care plan section)  Progress towards OT goals: Progressing toward goals  Acute Rehab OT Goals Patient Stated Goal: get better OT Goal Formulation: With patient Time For Goal Achievement: 10/15/22 Potential to Achieve Goals: Good ADL Goals Pt Will Perform Grooming: with supervision;standing Pt Will Perform Lower Body Dressing: with modified independence;sit to/from stand Pt Will Transfer to Toilet: with modified independence;ambulating Pt Will Perform Tub/Shower Transfer: Shower transfer;with modified independence;3 in 1 Pt/caregiver will Perform Home Exercise Program: Increased strength;Both right and left upper extremity;With written HEP provided;Independently Additional ADL Goal #1: Pt will independently verbalize 3 strategies to reduce risk of falls  Plan Discharge plan remains appropriate;Frequency remains appropriate    Co-evaluation                 AM-PAC OT "6 Clicks" Daily Activity     Outcome Measure   Help from another person eating meals?: None Help from another person taking care of personal grooming?: A Little Help from another person toileting, which includes using toliet, bedpan, or urinal?: A Little Help from another person bathing (including washing, rinsing, drying)?: A Little Help from another person to put on and taking off regular upper body clothing?: A Little Help from another person to put on and taking off regular lower body clothing?: A Little 6 Click Score: 19    End of Session Equipment Utilized During Treatment: Gait belt;Rolling walker (2  wheels);Other (comment) (platform rollator and straight cane)  OT Visit Diagnosis: Unsteadiness on feet (R26.81);Muscle weakness (generalized) (M62.81)   Activity Tolerance Patient tolerated treatment well   Patient Left in chair;with call bell/phone within reach;with chair alarm set   Nurse Communication Mobility status        Time: 1856-3149 OT Time Calculation (min): 42 min  Charges: OT General Charges $OT Visit: 1 Visit OT Treatments $Self Care/Home Management : 23-37 mins $Therapeutic Activity: 8-22 mins  Michael [name], [place]  Office [phone]   Michael [name] 10/10/2022, 8:51 AM

## 2022-10-10 NOTE — Progress Notes (Signed)
Mobility Specialist Progress Note:   10/10/22 1050  Mobility  Activity Ambulated with assistance in hallway  Level of Assistance Contact guard assist, steadying assist  Assistive Device Four wheel walker (upright)  Distance Ambulated (ft) 330 ft  Activity Response Tolerated well  Mobility Referral Yes  $Mobility charge 1 Mobility   Pt eager for mobility session. Required minG for safety, min vocal cues for scissoring. Pt c/o mild back pain throughout session, left in chair with all needs met. Chair alarm on.   Nelta Numbers Mobility Specialist Please contact via SecureChat or  Rehab office at [phone]

## 2022-10-10 NOTE — Progress Notes (Signed)
Nutrition Follow-up  DOCUMENTATION CODES:   Non-severe (moderate) malnutrition in context of chronic illness  INTERVENTION:  - Continue Ensure Enlive po BID, each supplement provides 350 kcal and 20 grams of protein.  NUTRITION DIAGNOSIS:   Moderate Malnutrition related to chronic illness (CHF, CKD, HIV) as evidenced by moderate fat depletion, severe muscle depletion.  GOAL:   Patient will meet greater than or equal to 90% of their needs  MONITOR:   Supplement acceptance, Labs, PO intake, Weight trends  REASON FOR ASSESSMENT:   Malnutrition Screening Tool    ASSESSMENT:   72 year old male who presented to the ED on 12/04 with chest pain. PMH of HTN, CHF, PAF, CAD s/p CABG and stenting, CKD stage IIIb, HIV on HAART, cocaine abuse, T2DM. Pt admitted with AKI on CKD, suicidal ideation, chest pain.  Meds reviewed: lipitor, rena-vit, miralax, senokot. Labs reviewed: BUN/Creatinine elevated. Blood sugars well controlled.   The pt ate 100% of his breakfast this am. Per record, pt continues to eat well. Pt is still pending SNF placement.   RD will sign off. Please re-consult if POC changes.   Diet Order:   Diet Order             Diet regular Room service appropriate? Yes; Fluid consistency: Thin  Diet effective now                   EDUCATION NEEDS:   Education needs have been addressed  Skin:  Skin Assessment: Reviewed RN Assessment  Last BM:  10/09/22  Height:   Ht Readings from Last 1 Encounters:  09/12/22 '5\' 8"'$  (1.727 m)    Weight:   Wt Readings from Last 1 Encounters:  10/07/22 76.5 kg    Ideal Body Weight:     BMI:  Body mass index is 25.64 kg/m.  Estimated Nutritional Needs:   Kcal:  2000-2200  Protein:  95-110 grams  Fluid:  >2.0 L  Thalia Bloodgood, RD, LDN, CNSC.

## 2022-10-10 NOTE — Progress Notes (Signed)
AKI on CKD stage IIIb PROGRESS NOTE    Michael [name]  DZH:[id number] DOB: [birth year] DOA: 09/09/2022 PCP: [name], MD   Brief Narrative:  This 72 y.o. male with a history of hypertension, chronic heart failure, paroxysmal atrial fibrillation, CAD s/p CABG/PCI, CKD, HIV, cocaine abuse. Patient presented secondary to left sided chest pain and suicidal ideations. Admission complicated by significant AKI that responded to IV fluids. Discharge complicated by inability to place patient at  SNF.  Medically clear.   Assessment & Plan:   Active Problems:   Acute kidney injury superimposed on chronic kidney disease ([name])   Essential hypertension   Paroxysmal A-fib ([name])   (HFpEF) heart failure with preserved ejection fraction ([name])   HIV (human immunodeficiency virus infection) ([place])   History of pulmonary embolism   Type 2 diabetes mellitus with hyperlipidemia ([name])   Polysubstance abuse ([place])   Suicidal ideations   COPD (chronic obstructive pulmonary disease) ([name])   Back pain   CKD (chronic kidney disease), stage IV ([name])   Hyperkalemia   AKI on CKD stage IV: Creatinine of 6.32 on admission, BUN of 81.  Patient initially started on IV fluids with improvement of renal function towards baseline.  Although In/Out show overall net positive, weight has significantly declined since admission.  Creatinine continues to rise with cessation of IV fluids.  Discussed with patient to keep well hydrated. BUN/creatinine seems stable. Continue to monitor serum creatinine. Creatinine 3.52 today.  Essential Hypertension: Continue diltiazem, hydralazine, and Imdur.   Paroxysmal Atrial fibrillation: Patient is not on anticoagulation secondary to history of high bleeding risk. Heart rate is well-controlled.  Continue diltiazem.   Heart failure with preserved EF: Appears euvolemic.  Not on diuretic therapy.   HIV+ Continue Julusa.   History of pulmonary embolism: Not on  anticoagulation.   Type 2 diabetes mellitus with hyperlipidemia ([name]) Hemoglobin A1C of 7.7%. Adequately controlled for age.    Polysubstance abuse: Noted. History of cocaine use. Counseled to avoid subsequent use.   Suicidal ideations: Evaluated by psychiatry. No role for inpatient admission. Continue trazodone and fluoxetine. He denies further suicidal / Homicidal ideations.   COPD (chronic obstructive pulmonary disease) ([name]) Continue Incruse Ellipta, Dulera and Duoneb PRN   Hyperkalemia Likely secondary to kidney disease. Resolved.   CKD (chronic kidney disease), stage IV ([name]) Baseline serum creatinine appears to be around 2.2-2.4.  Patient will need nephrology follow-up as an outpatient.   Chronic back pain: Patient appears to have a history of lumbar spinal stenosis.  He states history of back surgery but is unable to tell me what was performed. Lumbar x-ray without acute fracture. Pain improved with Norco. Continue Norco PRN     DVT prophylaxis:Heparin sq Code Status:  Full code. Family Communication:  No family at bed side. Disposition Plan:   Discharge pending bed availability versus improvement of functional ability to go home  Consultants:  Psychiatry Infectious diseases  Procedures:  Antimicrobials:  Subjective: Patient seen and examined at bedside.  Overnight events noted. Patient reports doing better, denies any specific symptoms.  Objective: Vitals:   10/09/22 2116 10/10/22 0546 10/10/22 0817 10/10/22 0856  BP: (!) 158/87 127/78  131/71  Pulse: 72 80  79  Resp: '18 18  18  '$ Temp: 97.9 F (36.6 C) 98 F (36.7 C)  98.5 F (36.9 C)  TempSrc: Oral Oral  Oral  SpO2:  100% 98% 99%  Weight:      Height:  Intake/Output Summary (Last 24 hours) at 10/10/2022 1136 Last data filed at 10/10/2022 0900 Gross per 24 hour  Intake 960 ml  Output 650 ml  Net 310 ml   Filed Weights   09/26/22 0351 09/28/22 2124 10/07/22 2056  Weight: 71.1 kg 76  kg 76.5 kg    Examination:  General exam: Appears comfortable, not in any acute distress. Respiratory system: CTA bilaterally, respiratory effort normal, RR 14. Cardiovascular system: S1 & S2 heard, regular rate and rhythm, no murmur. Gastrointestinal system: Abdomen is soft, non tender, non distended, BS+ Central nervous system: Alert and oriented x 3. No focal neurological deficits. Extremities: No edema, no cyanosis, no clubbing Skin: No rashes, lesions or ulcers Psychiatry: Judgement and insight appear normal. Mood & affect appropriate.     Data Reviewed: I have personally reviewed following labs and imaging studies  CBC: No results for input(s): "WBC", "NEUTROABS", "HGB", "HCT", "MCV", "PLT" in the last 168 hours.  Basic Metabolic Panel: Recent Labs  Lab 10/06/22 0316 10/07/22 0350 10/08/22 0518 10/09/22 0418 10/10/22 0314  NA 141 138 139 140 140  K 5.2* 4.9 5.2* 5.0 4.6  CL 110 114* 113* 114* 112*  CO2 20* 21* 20* 19* 21*  GLUCOSE 119* 124* 100* 97 106*  BUN 42* 41* 41* 38* 44*  CREATININE 3.65* 3.53* 3.57* 3.30* 3.52*  CALCIUM 8.6* 8.5* 8.5* 8.9 8.6*   GFR: Estimated Creatinine Clearance: 18.4 mL/min (A) (by C-G formula based on SCr of 3.52 mg/dL (H)). Liver Function Tests: No results for input(s): "AST", "ALT", "ALKPHOS", "BILITOT", "PROT", "ALBUMIN" in the last 168 hours. No results for input(s): "LIPASE", "AMYLASE" in the last 168 hours. No results for input(s): "AMMONIA" in the last 168 hours. Coagulation Profile: No results for input(s): "INR", "PROTIME" in the last 168 hours. Cardiac Enzymes: No results for input(s): "CKTOTAL", "CKMB", "CKMBINDEX", "TROPONINI" in the last 168 hours. BNP (last 3 results) No results for input(s): "PROBNP" in the last 8760 hours. HbA1C: No results for input(s): "HGBA1C" in the last 72 hours. CBG: Recent Labs  Lab 10/09/22 2114  GLUCAP 194*   Lipid Profile: No results for input(s): "CHOL", "HDL", "LDLCALC", "TRIG",  "CHOLHDL", "LDLDIRECT" in the last 72 hours. Thyroid Function Tests: No results for input(s): "TSH", "T4TOTAL", "FREET4", "T3FREE", "THYROIDAB" in the last 72 hours. Anemia Panel: No results for input(s): "VITAMINB12", "FOLATE", "FERRITIN", "TIBC", "IRON", "RETICCTPCT" in the last 72 hours. Sepsis Labs: No results for input(s): "PROCALCITON", "LATICACIDVEN" in the last 168 hours.  No results found for this or any previous visit (from the past 240 hour(s)).   Radiology Studies: No results found.  Scheduled Meds:  aspirin  81 mg Oral Daily   atorvastatin  40 mg Oral Daily   clopidogrel  75 mg Oral Daily   diltiazem  120 mg Oral Daily   dolutegravir-rilpivirine  1 tablet Oral Q lunch   feeding supplement  237 mL Oral BID BM   FLUoxetine  20 mg Oral Daily   gabapentin  300 mg Oral BID   heparin  5,000 Units Subcutaneous Q8H   hydrALAZINE  50 mg Oral Q8H   isosorbide mononitrate  15 mg Oral Daily   lidocaine  1 patch Transdermal Q24H   mometasone-formoterol  2 puff Inhalation BID   multivitamin  1 tablet Oral QHS   polyethylene glycol  17 g Oral BID   senna  1 tablet Oral BID   sodium chloride flush  3 mL Intravenous Q12H   traZODone  50 mg Oral QHS  umeclidinium bromide  1 puff Inhalation Daily   Continuous Infusions:   LOS: 30 days    Time spent: 35 mins    Aariyana Manz, MD [facility]   If 7PM-7AM, please contact night-coverage

## 2022-10-11 DIAGNOSIS — I480 Paroxysmal atrial fibrillation: Secondary | ICD-10-CM | POA: Diagnosis not present

## 2022-10-11 DIAGNOSIS — F1910 Other psychoactive substance abuse, uncomplicated: Secondary | ICD-10-CM | POA: Diagnosis not present

## 2022-10-11 DIAGNOSIS — N184 Chronic kidney disease, stage 4 (severe): Secondary | ICD-10-CM | POA: Diagnosis not present

## 2022-10-11 DIAGNOSIS — N179 Acute kidney failure, unspecified: Secondary | ICD-10-CM | POA: Diagnosis not present

## 2022-10-11 LAB — BASIC METABOLIC PANEL
Anion gap: 8 (ref 5–15)
BUN: 41 mg/dL — ABNORMAL HIGH (ref 8–23)
CO2: 18 mmol/L — ABNORMAL LOW (ref 22–32)
Calcium: 8.7 mg/dL — ABNORMAL LOW (ref 8.9–10.3)
Chloride: 113 mmol/L — ABNORMAL HIGH (ref 98–111)
Creatinine, Ser: 3.39 mg/dL — ABNORMAL HIGH (ref 0.61–1.24)
GFR, Estimated: 18 mL/min — ABNORMAL LOW (ref >60)
Glucose, Bld: 86 mg/dL (ref 70–99)
Potassium: 4.8 mmol/L (ref 3.5–5.1)
Sodium: 139 mmol/L (ref 135–145)

## 2022-10-11 MED ORDER — SODIUM BICARBONATE 650 MG PO TABS
325.0000 mg | ORAL_TABLET | Freq: Two times a day (BID) | ORAL | Status: DC
  Administered 2022-10-11 – 2022-10-18 (×14): 325 mg via ORAL
  Filled 2022-10-11 (×14): qty 1

## 2022-10-11 MED ORDER — STERILE WATER FOR INJECTION IV SOLN
INTRAVENOUS | Status: AC
  Filled 2022-10-11: qty 150
  Filled 2022-10-11 (×6): qty 1000

## 2022-10-11 NOTE — Progress Notes (Signed)
Mobility Specialist Progress Note:   10/11/22 1100  Mobility  Activity Ambulated with assistance in hallway  Level of Assistance Contact guard assist, steadying assist  Assistive Device Four wheel walker (upright)  Distance Ambulated (ft) 400 ft  Activity Response Tolerated well  Mobility Referral Yes  $Mobility charge 1 Mobility   Pt eager for mobility session. Required minG for safety and vocal cues for scissoring. Pt back in bed with all needs met.   Nelta Numbers Mobility Specialist Please contact via SecureChat or  Rehab office at [phone]

## 2022-10-11 NOTE — Progress Notes (Signed)
Mobility Specialist Progress Note:   10/11/22 1520  Mobility  Activity Ambulated with assistance in hallway  Level of Assistance Contact guard assist, steadying assist  Assistive Device Four wheel walker (Upright)  Distance Ambulated (ft) 400 ft  Activity Response Tolerated well  Mobility Referral Yes  $Mobility charge 1 Mobility   Pt eager for second mobility session today. Required minG for safety. Pt back in bed with all needs met.   Nelta Numbers Mobility Specialist Please contact via SecureChat or  Rehab office at [phone]

## 2022-10-11 NOTE — Progress Notes (Signed)
Occupational Therapy Treatment Patient Details Name: Michael [name] MRN: [id number] DOB: June 13, 1951 Today's Date: 10/11/2022   History of present illness 72 y.o. male admitted 09/09/22 with chest pain, AKI on CKD stage IIIb, Suicidal ideations in the setting of depression. Recently hospitalized 11/30-12/1/23. PMHx:HTN, chronic HFrEF with recovered LVEF 60 to 65%, PAF on Eliquis, CAD s/p CABG and stenting, DM, CKD stage IIIb, HIV on HAART, and cocaine abuse.   OT comments  STAR patient OT session: Patient continues to make gains with OT treatment with patient performing shower transfers with supervision from min guard and distant supervision and setup for dressing. Patient performed mobility in hallway with platform rollator to increase activity tolerance and safety. Acute OT to continue to follow with STAR program.    Recommendations for follow up therapy are one component of a multi-disciplinary discharge planning process, led by the attending physician.  Recommendations may be updated based on patient status, additional functional criteria and insurance authorization.    Follow Up Recommendations  Other (comment) (drug rehab)     Assistance Recommended at Discharge Intermittent Supervision/Assistance  Patient can return home with the following  Assist for transportation;A little help with walking and/or transfers;A little help with bathing/dressing/bathroom   Equipment Recommendations  None recommended by OT    Recommendations for Other Services      Precautions / Restrictions Precautions Precautions: Fall Restrictions Weight Bearing Restrictions: No       Mobility Bed Mobility Overal bed mobility: Needs Assistance Bed Mobility: Sit to Supine     Supine to sit: Supervision     General bed mobility comments: supervision for safety    Transfers Overall transfer level: Needs assistance Equipment used: Rollator (4 wheels) (platform) Transfers: Sit to/from Stand Sit  to Stand: Min guard           General transfer comment: min guard for safety     Balance Overall balance assessment: Needs assistance Sitting-balance support: No upper extremity supported, Feet supported Sitting balance-Leahy Scale: Good     Standing balance support: No upper extremity supported, During functional activity Standing balance-Leahy Scale: Fair Standing balance comment: able to stand for self care tasks without UE support                           ADL either performed or assessed with clinical judgement   ADL Overall ADL's : Needs assistance/impaired     Grooming: Wash/dry hands;Wash/dry face;Oral care;Supervision/safety;Standing Grooming Details (indicate cue type and reason): at sink Upper Body Bathing: Supervision/ safety;Sitting Upper Body Bathing Details (indicate cue type and reason): in shower Lower Body Bathing: Sit to/from stand;Supervison/ safety Lower Body Bathing Details (indicate cue type and reason): supervision for safety in shower Upper Body Dressing : Sitting;Supervision/safety Upper Body Dressing Details (indicate cue type and reason): donned pullover top Lower Body Dressing: Supervision/safety;Sit to/from stand Lower Body Dressing Details (indicate cue type and reason): able to donn pants and socks seated/standing         Tub/ Shower Transfer: Supervision/safety;Walk-in Lobbyist Details (indicate cue type and reason): supervision for safety        Extremity/Trunk Assessment              Vision   Additional Comments: occulsion glasses for mobility   Perception     Praxis      Cognition Arousal/Alertness: Awake/alert Behavior During Therapy: WFL for tasks assessed/performed Overall Cognitive Status: Within Functional Limits for tasks assessed  Exercises Exercises: General Upper Extremity General Exercises - Upper Extremity Shoulder  Flexion: Strengthening, Both, 15 reps, Seated, Theraband Theraband Level (Shoulder Flexion): Level 2 (Red) Shoulder ABduction: Strengthening, Both, 15 reps, Seated, Theraband Theraband Level (Shoulder Abduction): Level 2 (Red) Elbow Flexion: Strengthening, Both, 15 reps, Seated, Theraband Theraband Level (Elbow Flexion): Level 2 (Red) Elbow Extension: Strengthening, Both, 15 reps, Seated, Theraband Theraband Level (Elbow Extension): Level 2 (Red)    Shoulder Instructions       General Comments      Pertinent Vitals/ Pain       Pain Assessment Pain Assessment: Faces Faces Pain Scale: Hurts little more Pain Location: back after ambulation Pain Descriptors / Indicators: Discomfort, Grimacing Pain Intervention(s): Monitored during session, Repositioned, Heat applied  Home Living                                          Prior Functioning/Environment              Frequency  Min 5X/week        Progress Toward Goals  OT Goals(current goals can now be found in the care plan section)  Progress towards OT goals: Progressing toward goals  Acute Rehab OT Goals Patient Stated Goal: get better OT Goal Formulation: With patient Time For Goal Achievement: 10/15/22 Potential to Achieve Goals: Good ADL Goals Pt Will Perform Grooming: with supervision;standing Pt Will Perform Lower Body Dressing: with modified independence;sit to/from stand Pt Will Transfer to Toilet: with modified independence;ambulating Pt Will Perform Tub/Shower Transfer: Shower transfer;with modified independence;3 in 1 Pt/caregiver will Perform Home Exercise Program: Increased strength;Both right and left upper extremity;With written HEP provided;Independently Additional ADL Goal #1: Pt will independently verbalize 3 strategies to reduce risk of falls  Plan Discharge plan remains appropriate;Frequency remains appropriate    Co-evaluation                 AM-PAC OT "6 Clicks"  Daily Activity     Outcome Measure   Help from another person eating meals?: None Help from another person taking care of personal grooming?: A Little Help from another person toileting, which includes using toliet, bedpan, or urinal?: A Little Help from another person bathing (including washing, rinsing, drying)?: A Little Help from another person to put on and taking off regular upper body clothing?: A Little Help from another person to put on and taking off regular lower body clothing?: A Little 6 Click Score: 19    End of Session Equipment Utilized During Treatment: Gait belt;Rollator (4 wheels) (platform rollator)  OT Visit Diagnosis: Unsteadiness on feet (R26.81);Muscle weakness (generalized) (M62.81)   Activity Tolerance Patient tolerated treatment well   Patient Left in chair;with call bell/phone within reach;with chair alarm set   Nurse Communication Mobility status        Time: 2229-7989 OT Time Calculation (min): 43 min  Charges: OT General Charges $OT Visit: 1 Visit OT Treatments $Self Care/Home Management : 23-37 mins $Therapeutic Exercise: 8-22 mins  Lodema Hong, [place]  Office [phone]   Trixie Dredge 10/11/2022, 9:08 AM

## 2022-10-11 NOTE — Progress Notes (Signed)
[facility]                                                                              Michael [name], is a 72 y.o. male, DOB - 03/25/1951, VOJ:[id number] Admit date - 09/09/2022    Outpatient Primary MD for the patient is [name], MD  LOS - 31  days  Chief Complaint  Patient presents with   Suicidal       Brief summary   This 72 y.o. male with a history of hypertension, chronic heart failure, paroxysmal atrial fibrillation, CAD s/p CABG/PCI, CKD, HIV, cocaine abuse. Patient presented secondary to left sided chest pain and suicidal ideations. Admission complicated by significant AKI that responded to IV fluids.  Awaiting SNF  Assessment & Plan   Principal problem Acute kidney injury on CKD stage IV - Creatinine of 6.32 on admission, BUN of 81.  Baseline creatinine 2.2-2.4 -Initially started on IV fluids with improvement of renal function, still negative balance of 5.9 L. -BMET shows AKI with metabolic acidosis, CO2 18 with cr slightly trending down to 3.39 today from 3.52 yesterday -Will place on bicarb drip x 48hrs, added oral sodium bicarb tabs 325 mg twice daily, titrate up after bicarb drip is off.  -Encouraged p.o. diet -Needs outpatient nephrology close follow-up  Active problems Essential Hypertension: -BP stable, continue diltiazem, hydralazine and Imdur.     Paroxysmal atrial fibrillation  -Heart rate controlled, continue diltiazem  -Patient has not been on anticoagulation due to history of high bleeding risk  Chronic diastolic CHF, HFpEF -2D echo on 09/05/2022 showed EF of 55 to 60%, indeterminate diastolic parameters, no regional WMA -Currently euvolemic, follow closely with IV fluids   HIV+ Continue Julusa.   History of prior pulmonary embolism -Currently not on anticoagulation  Diabetes mellitus type 2, with complication of CKD stage IV -Hemoglobin A1c 7.7, diet controlled -CBGs fairly controlled    Type 2 diabetes  mellitus with hyperlipidemia ([name]) Hemoglobin A1C of 7.7%. Adequately controlled for age.    Polysubstance abuse: Noted. History of cocaine use. Counseled to avoid subsequent use.   Suicidal ideations: Evaluated by psychiatry. No role for inpatient admission. Continue trazodone and fluoxetine. -Denied any further suicidal/homicidal ideations.      COPD (chronic obstructive pulmonary disease) ([name]) Currently no acute wheezing, continue Incruse Ellipta, Dulera, DuoNebs as needed   Hyperkalemia -Likely due to #1, resolved    Chronic back pain: -History of lumbar spinal stenosis, back surgery.  -Currently stable, continue Norco as needed   Moderate protein calorie malnutrition Nutrition Problem: Moderate Malnutrition Etiology: chronic illness (CHF, CKD, HIV) Signs/Symptoms: moderate fat depletion, severe muscle depletion -Continue nutritional supplements  Code Status: Full CODE STATUS DVT Prophylaxis:  heparin injection 5,000 Units Start: 09/09/22 1400   Level of Care: Level of care: Med-Surg Family Communication: Updated patient   Disposition Plan:      Remains inpatient appropriate: Working with PT, improving gradually awaiting rehab however plan possibly to DC to home with home health if creatinine continues to improve   Procedures:  None  Consultants:   Psychiatry Infectious disease  Antimicrobials:   Anti-infectives (From admission, onward)  Start     Dose/Rate Route Frequency Ordered Stop   09/18/22 1200  dolutegravir-rilpivirine (JULUCA) 50-25 MG per tablet 1 tablet  Status:  Discontinued        1 tablet Oral Daily with lunch 09/17/22 1332 09/17/22 1336   09/18/22 1200  dolutegravir (TIVICAY) tablet 50 mg  Status:  Discontinued       See Hyperspace for full Linked Orders Report.   50 mg Oral Daily with lunch 09/17/22 1336 09/17/22 1517   09/18/22 1200  rilpivirine (EDURANT) tablet 25 mg  Status:  Discontinued       See Hyperspace for full Linked Orders  Report.   25 mg Oral Daily with lunch 09/17/22 1336 09/17/22 1517   09/18/22 1200  dolutegravir-rilpivirine (JULUCA) 50-25 MG per tablet 1 tablet        1 tablet Oral Daily with lunch 09/17/22 1517     09/09/22 1400  dolutegravir-rilpivirine (JULUCA) 50-25 MG per tablet 1 tablet  Status:  Discontinued        1 tablet Oral Daily before lunch 09/09/22 1350 09/12/22 0832          Medications  aspirin  81 mg Oral Daily   atorvastatin  40 mg Oral Daily   clopidogrel  75 mg Oral Daily   diltiazem  120 mg Oral Daily   dolutegravir-rilpivirine  1 tablet Oral Q lunch   feeding supplement  237 mL Oral BID BM   FLUoxetine  20 mg Oral Daily   gabapentin  300 mg Oral BID   heparin  5,000 Units Subcutaneous Q8H   hydrALAZINE  50 mg Oral Q8H   isosorbide mononitrate  15 mg Oral Daily   lidocaine  1 patch Transdermal Q24H   mometasone-formoterol  2 puff Inhalation BID   multivitamin  1 tablet Oral QHS   polyethylene glycol  17 g Oral BID   senna  1 tablet Oral BID   sodium chloride flush  3 mL Intravenous Q12H   traZODone  50 mg Oral QHS   umeclidinium bromide  1 puff Inhalation Daily      Subjective:   Michael [name] was seen and examined today.  Sitting up in the chair, no acute events overnight.  No nausea vomiting or abdominal pain.  He has not been eating too well. Patient denies dizziness, chest pain, shortness of breath  Objective:   Vitals:   10/10/22 1937 10/10/22 2132 10/11/22 0625 10/11/22 0821  BP:  132/82 125/72 128/62  Pulse: 75 72 70 75  Resp: '14 18 18 17  '$ Temp:  98 F (36.7 C) 98.1 F (36.7 C) 98 F (36.7 C)  TempSrc:  Oral Oral Oral  SpO2: 95% 98% 100% 100%  Weight:      Height:        Intake/Output Summary (Last 24 hours) at 10/11/2022 1211 Last data filed at 10/11/2022 6010 Gross per 24 hour  Intake 700 ml  Output 1050 ml  Net -350 ml     Wt Readings from Last 3 Encounters:  10/07/22 76.5 kg  09/06/22 69.6 kg  08/10/22 72.6 kg      Exam General: Alert and oriented x 3, NAD Cardiovascular: S1 S2 auscultated,  RRR Respiratory: Clear to auscultation bilaterally, no wheezing, rales Gastrointestinal: Soft, nontender, nondistended, + bowel sounds Ext: no pedal edema bilaterally Neuro: Strength 5/5 upper and lower extremities bilaterally Psych: Normal affect     Data Reviewed:  I have personally reviewed following labs    CBC Lab Results  Component Value Date   WBC 4.6 10/03/2022   RBC 2.87 (L) 10/03/2022   HGB 8.2 (L) 10/03/2022   HCT 25.3 (L) 10/03/2022   MCV 88.2 10/03/2022   MCH 28.6 10/03/2022   PLT 145 (L) 10/03/2022   MCHC 32.4 10/03/2022   RDW 14.7 10/03/2022   LYMPHSABS 0.9 09/05/2022   MONOABS 0.5 09/05/2022   EOSABS 0.1 09/05/2022   BASOSABS 0.0 47/06/6282     Last metabolic panel Lab Results  Component Value Date   NA 139 10/11/2022   K 4.8 10/11/2022   CL 113 (H) 10/11/2022   CO2 18 (L) 10/11/2022   BUN 41 (H) 10/11/2022   CREATININE 3.39 (H) 10/11/2022   GLUCOSE 86 10/11/2022   GFRNONAA 18 (L) 10/11/2022   GFRAA 33 (L) 12/12/2019   CALCIUM 8.7 (L) 10/11/2022   PHOS 2.8 09/18/2022   PROT 7.8 09/09/2022   ALBUMIN 4.0 09/09/2022   LABGLOB 3.2 12/26/2016   AGRATIO 1.2 12/26/2016   BILITOT 0.5 09/09/2022   ALKPHOS 93 09/09/2022   AST 24 09/09/2022   ALT 27 09/09/2022   ANIONGAP 8 10/11/2022    CBG (last 3)  Recent Labs    10/09/22 2114  GLUCAP 194*      Coagulation Profile: No results for input(s): "INR", "PROTIME" in the last 168 hours.   Radiology Studies: I have personally reviewed the imaging studies  No results found.     Estill Cotta M.D. [facility] 10/11/2022, 12:11 PM  Available via Epic secure chat 7am-7pm After 7 pm, please refer to night coverage provider listed on amion.

## 2022-10-12 ENCOUNTER — Inpatient Hospital Stay (HOSPITAL_COMMUNITY): Payer: Medicare Other

## 2022-10-12 DIAGNOSIS — R1084 Generalized abdominal pain: Secondary | ICD-10-CM

## 2022-10-12 DIAGNOSIS — R109 Unspecified abdominal pain: Secondary | ICD-10-CM

## 2022-10-12 LAB — BASIC METABOLIC PANEL
Anion gap: 10 (ref 5–15)
BUN: 35 mg/dL — ABNORMAL HIGH (ref 8–23)
CO2: 25 mmol/L (ref 22–32)
Calcium: 7.9 mg/dL — ABNORMAL LOW (ref 8.9–10.3)
Chloride: 104 mmol/L (ref 98–111)
Creatinine, Ser: 3.33 mg/dL — ABNORMAL HIGH (ref 0.61–1.24)
GFR, Estimated: 19 mL/min — ABNORMAL LOW (ref >60)
Glucose, Bld: 99 mg/dL (ref 70–99)
Potassium: 3.8 mmol/L (ref 3.5–5.1)
Sodium: 139 mmol/L (ref 135–145)

## 2022-10-12 NOTE — Progress Notes (Signed)
Mobility Specialist Progress Note:   10/12/22 1447  Mobility  Activity Ambulated with assistance in hallway  Level of Assistance Contact guard assist, steadying assist  Assistive Device Four wheel walker (Upright)  Distance Ambulated (ft) 400 ft  Activity Response Tolerated well  Mobility Referral Yes  $Mobility charge 1 Mobility   Pt received in bed and agreeable. No complaints. Pt left in bed with all needs met, call bell in reach, and bed alarm on.   Andrey Campanile Mobility Specialist Please contact via SecureChat or  Rehab office at [phone]

## 2022-10-12 NOTE — Assessment & Plan Note (Addendum)
None specific. No constipation or diarrhea. No nausea/vomiting. Having bowel movements. Normal bowel sounds.  Generalized tenderness. Abdominal x-ray unremarkable. Not relieved with bowel movements. -CT abdomen

## 2022-10-12 NOTE — Progress Notes (Signed)
PROGRESS NOTE    Abdulmalik Darco  WIO:[id number] DOB: Sep 24, 1951 DOA: 09/09/2022 PCP: [name], MD   Brief Narrative: Michael [name] is a 72 y.o. male with a history of hypertension, chronic heart failure, paroxysmal atrial fibrillation, CAD s/p CABG/PCI, CKD, HIV, cocaine abuse. [name]. Admission complicated by significant AKI that responded to IV fluids. Discharge complicated by inability to place patient at a SNF.    Assessment and Plan: Acute kidney injury superimposed on chronic kidney disease ([place]) Creatinine of 6.32 on admission. Associated BUN of 81. Patient initially started on IV fluids with improvement of renal function towards baseline. Although In/Out show overall net positive, weight has significantly declined since admission. Creatinine continues to rise with cessation of IV fluids. IV fluids restarted -BMP pending today  Essential hypertension -Continue diltiazem, hydralazine, and Imdur  Paroxysmal A-fib ([name]) Patient is not on anticoagulation secondary to history of high bleeding risk -Continue diltiazem  (HFpEF) heart failure with preserved ejection fraction ([name]) Stable. Not on diuretic therapy.  HIV (human immunodeficiency virus infection) ([place]) -Continue Juluca  History of pulmonary embolism Noted. Not on anticoagulation.  Type 2 diabetes mellitus with hyperlipidemia ([name]) Hemoglobin A1C of 7.7%. Adequately controlled for age.   Polysubstance abuse ([place]) Noted. History of cocaine use.  Suicidal ideations Evaluated by psychiatry. No role for inpatient admission. -Continue trazodone and fluoxetine  COPD (chronic obstructive pulmonary disease) ([name]) -Continue Incruse Ellipta, Dulera and Duoneb PRN  Abdominal pain None specific. No constipation or diarrhea. No nausea/vomiting. Having bowel movements. Normal bowel sounds. Some associated right sided abdominal  tenderness. -Abdominal X-ray  Hyperkalemia Likely secondary to kidney disease. Mild. Improved.  CKD (chronic kidney disease), stage IV ([name]) Baseline serum creatinine appears to be around 2.2-2.4. Patient will need nephrology follow-up as an outpatient.  Back pain Patient appears to have a history of lumbar spinal stenosis. He states a history of back surgery but is unable to tell me what was performed. Lumbar x-ray without acute fracture. Pain improved with Norco. -Continue Norco PRN   DVT prophylaxis: Heparin subq Code Status:   Code Status: Full Code Family Communication: None at bedside Disposition Plan: Discharge pending bed availability vs improvement of functional ability to go home   Consultants:  Psychiatry Infectious disease  Procedures:    Antimicrobials:     Subjective: Some abdominal pain today. Did not sleep well.   Objective: BP 124/61 (BP Location: Left Arm)   Pulse 93   Temp 98.4 F (36.9 C) (Oral)   Resp 18   Ht '5\' 8"'$  (1.727 m)   Wt 76.5 kg   SpO2 98%   BMI 25.64 kg/m   Examination:  General exam: Appears calm and comfortable Respiratory system: Clear to auscultation. Respiratory effort normal. Cardiovascular system: S1 & S2 heard, Normal rate with regular rhythm. Gastrointestinal system: Abdomen is non-distended, soft and tender in right quadrants. Firm subcutaneous nodule noted in right abdomen. Normal bowel sounds heard. Central nervous system: Alert and oriented. No focal neurological deficits. Musculoskeletal: No edema. No calf tenderness Skin: No cyanosis. No rashes Psychiatry: Judgement and insight appear normal. Mood & affect appropriate.    Data Reviewed: I have personally reviewed following labs and imaging studies  CBC Lab Results  Component Value Date   WBC 4.6 10/03/2022   RBC 2.87 (L) 10/03/2022   HGB 8.2 (L) 10/03/2022   HCT 25.3 (L) 10/03/2022   MCV 88.2 10/03/2022   MCH 28.6 10/03/2022  PLT 145 (L) 10/03/2022    MCHC 32.4 10/03/2022   RDW 14.7 10/03/2022   LYMPHSABS 0.9 09/05/2022   MONOABS 0.5 09/05/2022   EOSABS 0.1 09/05/2022   BASOSABS 0.0 44/81/8563     Last metabolic panel Lab Results  Component Value Date   NA 139 10/11/2022   K 4.8 10/11/2022   CL 113 (H) 10/11/2022   CO2 18 (L) 10/11/2022   BUN 41 (H) 10/11/2022   CREATININE 3.39 (H) 10/11/2022   GLUCOSE 86 10/11/2022   GFRNONAA 18 (L) 10/11/2022   GFRAA 33 (L) 12/12/2019   CALCIUM 8.7 (L) 10/11/2022   PHOS 2.8 09/18/2022   PROT 7.8 09/09/2022   ALBUMIN 4.0 09/09/2022   LABGLOB 3.2 12/26/2016   AGRATIO 1.2 12/26/2016   BILITOT 0.5 09/09/2022   ALKPHOS 93 09/09/2022   AST 24 09/09/2022   ALT 27 09/09/2022   ANIONGAP 8 10/11/2022    GFR: Estimated Creatinine Clearance: 19.1 mL/min (A) (by C-G formula based on SCr of 3.39 mg/dL (H)).  No results found for this or any previous visit (from the past 240 hour(s)).    Radiology Studies: No results found.    LOS: 32 days    Cordelia Poche, MD [facility] 10/12/2022, 11:43 AM   If 7PM-7AM, please contact night-coverage [email or web address]

## 2022-10-13 ENCOUNTER — Inpatient Hospital Stay (HOSPITAL_COMMUNITY): Payer: Medicare Other

## 2022-10-13 LAB — CBC
HCT: 23.1 % — ABNORMAL LOW (ref 39.0–52.0)
Hemoglobin: 7.9 g/dL — ABNORMAL LOW (ref 13.0–17.0)
MCH: 29.3 pg (ref 26.0–34.0)
MCHC: 34.2 g/dL (ref 30.0–36.0)
MCV: 85.6 fL (ref 80.0–100.0)
Platelets: 103 10*3/uL — ABNORMAL LOW (ref 150–400)
RBC: 2.7 MIL/uL — ABNORMAL LOW (ref 4.22–5.81)
RDW: 14.4 % (ref 11.5–15.5)
WBC: 3.8 10*3/uL — ABNORMAL LOW (ref 4.0–10.5)
nRBC: 0 % (ref 0.0–0.2)

## 2022-10-13 LAB — COMPREHENSIVE METABOLIC PANEL
ALT: 50 U/L — ABNORMAL HIGH (ref 0–44)
AST: 38 U/L (ref 15–41)
Albumin: 3.3 g/dL — ABNORMAL LOW (ref 3.5–5.0)
Alkaline Phosphatase: 60 U/L (ref 38–126)
Anion gap: 10 (ref 5–15)
BUN: 31 mg/dL — ABNORMAL HIGH (ref 8–23)
CO2: 31 mmol/L (ref 22–32)
Calcium: 8.1 mg/dL — ABNORMAL LOW (ref 8.9–10.3)
Chloride: 98 mmol/L (ref 98–111)
Creatinine, Ser: 3.29 mg/dL — ABNORMAL HIGH (ref 0.61–1.24)
GFR, Estimated: 19 mL/min — ABNORMAL LOW (ref >60)
Glucose, Bld: 108 mg/dL — ABNORMAL HIGH (ref 70–99)
Potassium: 3.9 mmol/L (ref 3.5–5.1)
Sodium: 139 mmol/L (ref 135–145)
Total Bilirubin: 0.6 mg/dL (ref 0.3–1.2)
Total Protein: 5.9 g/dL — ABNORMAL LOW (ref 6.5–8.1)

## 2022-10-13 MED ORDER — ONDANSETRON HCL 4 MG/2ML IJ SOLN: 4.0000 mg | Freq: Four times a day (QID) | INTRAMUSCULAR | Status: DC | PRN

## 2022-10-13 MED ORDER — IOHEXOL 9 MG/ML PO SOLN
500.0000 mL | ORAL | Status: AC
  Administered 2022-10-13 (×2): 500 mL via ORAL

## 2022-10-13 NOTE — Progress Notes (Signed)
PROGRESS NOTE    Michael [name]  [id number]:[id number] DOB: September 30, 1951 DOA: 09/09/2022 PCP: [name], MD   Brief Narrative: Michael [name] is a 72 y.o. male with a history of hypertension, chronic heart failure, paroxysmal atrial fibrillation, CAD s/p CABG/PCI, CKD, HIV, cocaine abuse. [name]. Admission complicated by significant AKI that responded to IV fluids. Discharge complicated by inability to place patient at a SNF.    Assessment and Plan: Acute kidney injury superimposed on chronic kidney disease ([place]) Creatinine of 6.32 on admission. Associated BUN of 81. Patient initially started on IV fluids with improvement of renal function towards baseline. Although In/Out show overall net positive, weight has significantly declined since admission. Creatinine continues to rise with cessation of IV fluids. IV fluids restarted. Creatinine stable.  Essential hypertension -Continue diltiazem, hydralazine, and Imdur  Paroxysmal A-fib ([place]) Patient is not on anticoagulation secondary to history of high bleeding risk -Continue diltiazem  (HFpEF) heart failure with preserved ejection fraction ([name]) Stable. Not on diuretic therapy.  HIV (human immunodeficiency virus infection) ([place]) -Continue Juluca  History of pulmonary embolism Noted. Not on anticoagulation.  Type 2 diabetes mellitus with hyperlipidemia ([name]) Hemoglobin A1C of 7.7%. Adequately controlled for age.   Polysubstance abuse ([place]) Noted. History of cocaine use.  Suicidal ideations Evaluated by psychiatry. No role for inpatient admission. -Continue trazodone and fluoxetine  COPD (chronic obstructive pulmonary disease) ([name]) -Continue Incruse Ellipta, Dulera and Duoneb PRN  Abdominal pain None specific. No constipation or diarrhea. No nausea/vomiting. Having bowel movements. Normal bowel sounds.  Generalized tenderness. Abdominal x-ray  unremarkable. Not relieved with bowel movements. -CT abdomen  Hyperkalemia Likely secondary to kidney disease. Mild. Improved.  CKD (chronic kidney disease), stage IV ([name]) Baseline serum creatinine appears to be around 2.2-2.4. Patient will need nephrology follow-up as an outpatient.  Back pain Patient appears to have a history of lumbar spinal stenosis. He states a history of back surgery but is unable to tell me what was performed. Lumbar x-ray without acute fracture. Pain improved with Norco. -Continue Norco PRN   DVT prophylaxis: Heparin subq Code Status:   Code Status: Full Code Family Communication: None at bedside Disposition Plan: Discharge pending bed availability vs improvement of functional ability to go home   Consultants:  Psychiatry Infectious disease  Procedures:    Antimicrobials:     Subjective: Patient reports continued abdominal pain. No other symptoms.  Objective: BP (!) 143/73 (BP Location: Left Arm)   Pulse 66   Temp 98 F (36.7 C) (Oral)   Resp 18   Ht '5\' 8"'$  (1.727 m)   Wt 76.5 kg   SpO2 97%   BMI 25.64 kg/m   Examination:  General exam: Appears calm and comfortable Respiratory system: Clear to auscultation. Respiratory effort normal. Cardiovascular system: S1 & S2 heard, RRR. No murmurs. Gastrointestinal system: Abdomen is non-distended, soft and generally tender. Normal bowel sounds heard. Central nervous system: Alert and oriented. No focal neurological deficits. Musculoskeletal: No edema. No calf tenderness Skin: No cyanosis. No rashes Psychiatry: Judgement and insight appear normal. Mood & affect appropriate.    Data Reviewed: I have personally reviewed following labs and imaging studies  CBC Lab Results  Component Value Date   WBC 3.8 (L) 10/13/2022   RBC 2.70 (L) 10/13/2022   HGB 7.9 (L) 10/13/2022   HCT 23.1 (L) 10/13/2022   MCV 85.6 10/13/2022   MCH 29.3 10/13/2022   PLT 103 (L) 10/13/2022  MCHC 34.2 10/13/2022    RDW 14.4 10/13/2022   LYMPHSABS 0.9 09/05/2022   MONOABS 0.5 09/05/2022   EOSABS 0.1 09/05/2022   BASOSABS 0.0 70/35/0093     Last metabolic panel Lab Results  Component Value Date   NA 139 10/13/2022   K 3.9 10/13/2022   CL 98 10/13/2022   CO2 31 10/13/2022   BUN 31 (H) 10/13/2022   CREATININE 3.29 (H) 10/13/2022   GLUCOSE 108 (H) 10/13/2022   GFRNONAA 19 (L) 10/13/2022   GFRAA 33 (L) 12/12/2019   CALCIUM 8.1 (L) 10/13/2022   PHOS 2.8 09/18/2022   PROT 5.9 (L) 10/13/2022   ALBUMIN 3.3 (L) 10/13/2022   LABGLOB 3.2 12/26/2016   AGRATIO 1.2 12/26/2016   BILITOT 0.6 10/13/2022   ALKPHOS 60 10/13/2022   AST 38 10/13/2022   ALT 50 (H) 10/13/2022   ANIONGAP 10 10/13/2022    GFR: Estimated Creatinine Clearance: 19.6 mL/min (A) (by C-G formula based on SCr of 3.29 mg/dL (H)).  No results found for this or any previous visit (from the past 240 hour(s)).    Radiology Studies: DG Abd Portable 1V  Result Date: 10/12/2022 CLINICAL DATA:  Abdominal tenderness EXAM: PORTABLE ABDOMEN - 1 VIEW COMPARISON:  None Available. FINDINGS: Lung bases are normal. No free air, portal venous gas, or pneumatosis. There is a paucity of bowel gas limiting evaluation but no evidence of obstruction or abnormal fecal loading. Phleboliths are identified in the pelvis. No other abnormalities. IMPRESSION: No cause for the patient's abdominal pain identified. Electronically Signed   By: Dorise Bullion III M.D.   On: 10/12/2022 15:56      LOS: 33 days    Cordelia Poche, MD [facility] 10/13/2022, 11:41 AM   If 7PM-7AM, please contact night-coverage [email or web address]

## 2022-10-13 NOTE — Progress Notes (Signed)
Occupational Therapy Treatment Patient Details Name: Michael [name] MRN: [id number] DOB: 1951-01-26 Today's Date: 10/13/2022   History of present illness 72 y.o. male admitted 09/09/22 with chest pain, AKI on CKD stage IIIb, Suicidal ideations in the setting of depression. Recently hospitalized 11/30-12/1/23. PMHx:HTN, chronic HFrEF with recovered LVEF 60 to 65%, PAF on Eliquis, CAD s/p CABG and stenting, DM, CKD stage IIIb, HIV on HAART, and cocaine abuse.   OT comments  [facility] Patient OT session: Patient continues to be supervision for bed mobility and standing at sink for grooming and min guard for ambulating to bathroom for toilet transfer. Patient demonstrated increased tolerance with mobility today with platform rollator. Acute OT to continue to follow with [facility] program.    Recommendations for follow up therapy are one component of a multi-disciplinary discharge planning process, led by the attending physician.  Recommendations may be updated based on patient status, additional functional criteria and insurance authorization.    Follow Up Recommendations  Other (comment) (drug rehab)     Assistance Recommended at Discharge Intermittent Supervision/Assistance  Patient can return home with the following  Assist for transportation;A little help with walking and/or transfers;A little help with bathing/dressing/bathroom   Equipment Recommendations  None recommended by OT    Recommendations for Other Services      Precautions / Restrictions Precautions Precautions: Fall Restrictions Weight Bearing Restrictions: No       Mobility Bed Mobility Overal bed mobility: Needs Assistance Bed Mobility: Sit to Supine     Supine to sit: Supervision     General bed mobility comments: supervision for safety    Transfers Overall transfer level: Needs assistance Equipment used: Rollator (4 wheels) (platform rollator) Transfers: Sit to/from Stand Sit to Stand: Min guard            General transfer comment: min guard for safety     Balance Overall balance assessment: Needs assistance Sitting-balance support: No upper extremity supported, Feet supported Sitting balance-Leahy Scale: Good     Standing balance support: No upper extremity supported, During functional activity Standing balance-Leahy Scale: Fair Standing balance comment: able to stand for self care tasks without UE support                           ADL either performed or assessed with clinical judgement   ADL Overall ADL's : Needs assistance/impaired     Grooming: Wash/dry hands;Wash/dry face;Oral care;Supervision/safety;Standing Grooming Details (indicate cue type and reason): at sink                 Toilet Transfer: Min guard;Ambulation;Regular Toilet;Grab bars Toilet Transfer Details (indicate cue type and reason): patient ambulated to bathroom with straight cane and min guard assist for safety Toileting- Clothing Manipulation and Hygiene: Modified independent;Sitting/lateral lean              Extremity/Trunk Assessment              Vision       Perception     Praxis      Cognition Arousal/Alertness: Awake/alert Behavior During Therapy: WFL for tasks assessed/performed Overall Cognitive Status: Within Functional Limits for tasks assessed                                 General Comments: increased complaints of dizziness        Exercises Exercises: General Upper Extremity General Exercises - Upper  Extremity Shoulder Flexion: Strengthening, Both, 15 reps, Seated, Theraband Theraband Level (Shoulder Flexion): Level 2 (Red) Shoulder ABduction: Strengthening, Both, 15 reps, Seated, Theraband Theraband Level (Shoulder Abduction): Level 2 (Red) Elbow Flexion: Strengthening, Both, 15 reps, Seated, Theraband Theraband Level (Elbow Flexion): Level 2 (Red) Elbow Extension: Strengthening, Both, 15 reps, Seated, Theraband Theraband Level (Elbow  Extension): Level 2 (Red)    Shoulder Instructions       General Comments      Pertinent Vitals/ Pain       Pain Assessment Pain Assessment: Faces Faces Pain Scale: Hurts little more Pain Location: back after ambulation Pain Descriptors / Indicators: Discomfort, Grimacing Pain Intervention(s): Limited activity within patient's tolerance, Monitored during session, Repositioned, Heat applied  Home Living                                          Prior Functioning/Environment              Frequency  Min 5X/week        Progress Toward Goals  OT Goals(current goals can now be found in the care plan section)  Progress towards OT goals: Progressing toward goals  Acute Rehab OT Goals Patient Stated Goal: get better OT Goal Formulation: With patient Time For Goal Achievement: 10/15/22 Potential to Achieve Goals: Good ADL Goals Pt Will Perform Grooming: with supervision;standing Pt Will Perform Lower Body Dressing: with modified independence;sit to/from stand Pt Will Transfer to Toilet: with modified independence;ambulating Pt Will Perform Tub/Shower Transfer: Shower transfer;with modified independence;3 in 1 Pt/caregiver will Perform Home Exercise Program: Increased strength;Both right and left upper extremity;With written HEP provided;Independently Additional ADL Goal #1: Pt will independently verbalize 3 strategies to reduce risk of falls  Plan Discharge plan remains appropriate;Frequency remains appropriate    Co-evaluation                 AM-PAC OT "6 Clicks" Daily Activity     Outcome Measure   Help from another person eating meals?: None Help from another person taking care of personal grooming?: A Little Help from another person toileting, which includes using toliet, bedpan, or urinal?: A Little Help from another person bathing (including washing, rinsing, drying)?: A Little Help from another person to put on and taking off regular  upper body clothing?: A Little Help from another person to put on and taking off regular lower body clothing?: A Little 6 Click Score: 19    End of Session Equipment Utilized During Treatment: Gait belt;Rollator (4 wheels);Other (comment) (platform)  OT Visit Diagnosis: Unsteadiness on feet (R26.81);Muscle weakness (generalized) (M62.81)   Activity Tolerance Patient tolerated treatment well   Patient Left in chair;with call bell/phone within reach;with chair alarm set   Nurse Communication Mobility status        Time: 4332-9518 OT Time Calculation (min): 46 min  Charges: OT General Charges $OT Visit: 1 Visit OT Treatments $Self Care/Home Management : 8-22 mins $Therapeutic Activity: 8-22 mins $Therapeutic Exercise: 8-22 mins  Lodema Hong, [place]  Office [place] 10/13/2022, 8:52 AM

## 2022-10-13 NOTE — Progress Notes (Signed)
Mobility Specialist Progress Note:   10/13/22 1131  Mobility  Activity Ambulated with assistance in hallway  Level of Assistance Contact guard assist, steadying assist  Assistive Device Four wheel walker (Upright)  Distance Ambulated (ft) 450 ft  Activity Response Tolerated well  Mobility Referral Yes  $Mobility charge 1 Mobility   Pt received in chair and agreeable. No complaints. Pt left in chair with all needs met, call bell in reach, and chair alarm on.   Andrey Campanile Mobility Specialist Please contact via SecureChat or  Rehab office at [phone]

## 2022-10-14 LAB — BASIC METABOLIC PANEL
Anion gap: 8 (ref 5–15)
BUN: 29 mg/dL — ABNORMAL HIGH (ref 8–23)
CO2: 27 mmol/L (ref 22–32)
Calcium: 8 mg/dL — ABNORMAL LOW (ref 8.9–10.3)
Chloride: 103 mmol/L (ref 98–111)
Creatinine, Ser: 3.16 mg/dL — ABNORMAL HIGH (ref 0.61–1.24)
GFR, Estimated: 20 mL/min — ABNORMAL LOW (ref >60)
Glucose, Bld: 141 mg/dL — ABNORMAL HIGH (ref 70–99)
Potassium: 4.3 mmol/L (ref 3.5–5.1)
Sodium: 138 mmol/L (ref 135–145)

## 2022-10-14 NOTE — Progress Notes (Signed)
Mobility Specialist Progress Note:   10/14/22 1500  Mobility  Activity Ambulated with assistance in hallway  Level of Assistance Contact guard assist, steadying assist  Assistive Device Four wheel walker (Upright)  Distance Ambulated (ft) 450 ft  Activity Response Tolerated well  Mobility Referral Yes  $Mobility charge 1 Mobility    Pt was agreeable for mobility session. Pt wore occulusion glasses. Took 1 seated rest break half-way through session. Pt c/o dizziness and nausea throughout. Returned pt to bed with all needs met.   Royetta Crochet Mobility Specialist Please contact via [employer] or  Rehab office at [phone]

## 2022-10-14 NOTE — Progress Notes (Signed)
Physical Therapy Treatment Patient Details Name: Michael [name] MRN: [id number] DOB: 05/17/1951 Today's Date: 10/14/2022   History of Present Illness 72 y.o. male admitted 09/09/22 with chest pain, AKI on CKD stage IIIb, Suicidal ideations in the setting of depression. Recently hospitalized 11/30-12/1/23. PMHx:HTN, chronic HFrEF with recovered LVEF 60 to 65%, PAF on Eliquis, CAD s/p CABG and stenting, DM, CKD stage IIIb, HIV on HAART, and cocaine abuse.    PT Comments    Continuing work on functional mobility and activity tolerance;  Sessions continue to focus on safety with amb/mobiltiy, increasing activity tolerance, while decreasing fall risk; today, took serial BPs throughout session, not just to be able to rule out hypotension as a cause of his dizziness (which we did); If we use BP as an indicator of increased anxiety, we can possibly link increased anxiety with his symptoms of dizziness, perhaps causally;   Michael [name]' dizziness was present during session and went largely unchanged throughout session; BPs were as follows:   Sitting                157/89, HR 64 Initial stand        147/84, HR 71 Standing, >3 minutes, after hallway walk with SPC                              166/89, HR 78 Standing, >10 minutes, after hallway walk with SPC, and standing balance exercises                            149/92, HR78  All of Michael [name]' BPs are on the higher side, and the SBP fluctuations don't show a 20 mmHg change -- and more importantly, Michael [name]' dizzy symptoms did not change; can rule BP out as cause of dizziness;   Asked Mobility Specialists to re-create the same walk and stnding post walk BP we did this session with SPC -- but with upright RW instead;  Standing BP immediately after walk with SPC -- 166/89, HR 78                                              Standing BP immediately after walk with upright rollator - 140/81, HR 77  This difference might point towards anxiety being a big piece of his  dizziness -- but will need to ask Mobility Specialists if he indicated his dizziness with their walk was better, worse, or the same as our walk with [facility];   Actions to take to boost pt's confidence with walking with a unilateral device are continuing practice, and continuing work on balance therex; My understanding is that in order to be able to participate in substance eabuse rehab, which Michael [name] is interested in doing, he must be able to safely walk with a cane  Recommendations for follow up therapy are one component of a multi-disciplinary discharge planning process, led by the attending physician.  Recommendations may be updated based on patient status, additional functional criteria and insurance authorization.  Follow Up Recommendations  Other (comment) (drug rehab) Can patient physically be transported by private vehicle: Yes   Assistance Recommended at Discharge Set up Supervision/Assistance  Patient can return home with the following A little help with walking and/or transfers;A little help with bathing/dressing/bathroom;Direct supervision/assist for medications management;Direct supervision/assist  for financial management;Assist for transportation;Assistance with cooking/housework   Equipment Recommendations  Other (comment) (upright rollator)    Recommendations for Other Services Other (comment)     Precautions / Restrictions Precautions Precautions: Fall     Mobility  Bed Mobility                    Transfers Overall transfer level: Needs assistance Equipment used: Straight cane Transfers: Sit to/from Stand Sit to Stand: Min guard           General transfer comment: min guard for safety    Ambulation/Gait Ambulation/Gait assistance: Min assist, Min guard Gait Distance (Feet): 50 Feet (x2) Assistive device: Straight cane, Rollator (4 wheels) Gait Pattern/deviations: Step-through pattern, Decreased step length - right, Decreased step length - left Gait  velocity: slowed     General Gait Details: contact guard assist, as pt reports feels safer during ambulation with SPC, again and dizziness,when switched to upright Rollator pt requiring only min guard, pt report decreased anxiety, dizziness remained; took sitting and standing BPs throughout session, all on the high side, but no SBP drops that would explain dizziness   Stairs             Wheelchair Mobility    Modified Rankin (Stroke Patients Only)       Balance     Sitting balance-Leahy Scale: Good       Standing balance-Leahy Scale: Fair   Single Leg Stance - Right Leg: 4 (3 reps with hands hovering over counter) Single Leg Stance - Left Leg: 1 (2 bouts with 1 second hold, one bout with 5 second hold)                        Cognition Arousal/Alertness: Awake/alert Behavior During Therapy: WFL for tasks assessed/performed Overall Cognitive Status: Within Functional Limits for tasks assessed                                          Exercises Other Exercises Other Exercises: standing hip abduction against wall x 3 reps, R and L    General Comments        Pertinent Vitals/Pain Pain Assessment Pain Assessment: 0-10 Pain Score: 8  Faces Pain Scale: Hurts little more Pain Location: back after ambulation Pain Descriptors / Indicators: Discomfort, Grimacing Pain Intervention(s): Monitored during session    Home Living                          Prior Function            PT Goals (current goals can now be found in the care plan section) Acute Rehab PT Goals Patient Stated Goal: agreeable to walk PT Goal Formulation: With patient Time For Goal Achievement: 09/27/22 Potential to Achieve Goals: Good Progress towards PT goals: Progressing toward goals    Frequency    Min 3X/week      PT Plan Current plan remains appropriate    Co-evaluation              AM-PAC PT "6 Clicks" Mobility   Outcome Measure   Help needed turning from your back to your side while in a flat bed without using bedrails?: None Help needed moving from lying on your back to sitting on the side of a flat bed without using bedrails?: A  Little Help needed moving to and from a bed to a chair (including a wheelchair)?: A Little Help needed standing up from a chair using your arms (e.g., wheelchair or bedside chair)?: A Little Help needed to walk in hospital room?: A Little Help needed climbing 3-5 steps with a railing? : A Lot 6 Click Score: 18    End of Session Equipment Utilized During Treatment: Gait belt Activity Tolerance: Patient tolerated treatment well Patient left: Other (comment) (walkign back to room with mobility Specialists) Nurse Communication: Mobility status PT Visit Diagnosis: Unsteadiness on feet (R26.81);Other abnormalities of gait and mobility (R26.89);History of falling (Z91.81);Muscle weakness (generalized) (M62.81);Difficulty in walking, not elsewhere classified (R26.2)     Time: 1219-7588 PT Time Calculation (min) (ACUTE ONLY): 50 min  Charges:  $Gait Training: 23-37 mins $Therapeutic Exercise: 8-22 mins                     Roney Marion, PT  Acute Rehabilitation Services Office [phone]    Colletta [place] 10/14/2022, 4:10 PM

## 2022-10-14 NOTE — Progress Notes (Signed)
PROGRESS NOTE    Michael [name]  ZGY:[id number] DOB: 03/23/1951 DOA: 09/09/2022 PCP: [name], MD   Brief Narrative: Michael [name] is a 72 y.o. male with a history of hypertension, chronic heart failure, paroxysmal atrial fibrillation, CAD s/p CABG/PCI, CKD, HIV, cocaine abuse. [name]. Admission complicated by significant AKI that responded to IV fluids. Discharge complicated by inability to place patient at a SNF.    Assessment and Plan: Acute kidney injury superimposed on chronic kidney disease ([place]) Creatinine of 6.32 on admission. Associated BUN of 81. Patient initially started on IV fluids with improvement of renal function towards baseline. Although In/Out show overall net positive, weight has significantly declined since admission. Creatinine continues to rise with cessation of IV fluids. IV fluids restarted. Creatinine stable.  Essential hypertension -Continue diltiazem, hydralazine, and Imdur  Paroxysmal A-fib ([place]) Patient is not on anticoagulation secondary to history of high bleeding risk -Continue diltiazem  (HFpEF) heart failure with preserved ejection fraction ([name]) Stable. Not on diuretic therapy.  HIV (human immunodeficiency virus infection) ([place]) -Continue Juluca  History of pulmonary embolism Noted. Not on anticoagulation.  Type 2 diabetes mellitus with hyperlipidemia ([name]) Hemoglobin A1C of 7.7%. Adequately controlled for age.   Polysubstance abuse ([place]) Noted. History of cocaine use.  Suicidal ideations Evaluated by psychiatry. No role for inpatient admission. -Continue trazodone and fluoxetine  COPD (chronic obstructive pulmonary disease) ([name]) -Continue Incruse Ellipta, Dulera and Duoneb PRN  Hyperkalemia Likely secondary to kidney disease. Mild. Improved.  CKD (chronic kidney disease), stage IV ([name]) Baseline serum creatinine appears to be around 2.2-2.4.  Patient will need nephrology follow-up as an outpatient.  Back pain Patient appears to have a history of lumbar spinal stenosis. He states a history of back surgery but is unable to tell me what was performed. Lumbar x-ray without acute fracture. Pain improved with Norco. -Continue Norco PRN  Abdominal pain-resolved as of 10/14/2022 None specific. No constipation or diarrhea. No nausea/vomiting. Having bowel movements. Normal bowel sounds.  Generalized tenderness. Abdominal x-ray unremarkable. Not relieved with bowel movements. CT abdomen obtained and was without etiology. Symptoms now resolved.   DVT prophylaxis: Heparin subq Code Status:   Code Status: Full Code Family Communication: None at bedside Disposition Plan: Discharge pending bed availability vs improvement of functional ability to go home   Consultants:  Psychiatry Infectious disease  Procedures:    Antimicrobials:     Subjective: Abdominal pain resolved overnight. No concerns this morning.  Objective: BP (!) 149/80 (BP Location: Left Arm)   Pulse 65   Temp 97.9 F (36.6 C) (Oral)   Resp 19   Ht '5\' 8"'$  (1.727 m)   Wt 76.5 kg   SpO2 98%   BMI 25.64 kg/m   Examination:  General exam: Appears calm and comfortable. Respiratory system: Respiratory effort normal. Central nervous system: Alert and oriented. Psychiatry: Judgement and insight appear normal. Mood & affect appropriate.    Data Reviewed: I have personally reviewed following labs and imaging studies  CBC Lab Results  Component Value Date   WBC 3.8 (L) 10/13/2022   RBC 2.70 (L) 10/13/2022   HGB 7.9 (L) 10/13/2022   HCT 23.1 (L) 10/13/2022   MCV 85.6 10/13/2022   MCH 29.3 10/13/2022   PLT 103 (L) 10/13/2022   MCHC 34.2 10/13/2022   RDW 14.4 10/13/2022   LYMPHSABS 0.9 09/05/2022   MONOABS 0.5 09/05/2022   EOSABS 0.1 09/05/2022   BASOSABS 0.0  28/41/3244     Last metabolic panel Lab Results  Component Value Date   NA 138 10/14/2022    K 4.3 10/14/2022   CL 103 10/14/2022   CO2 27 10/14/2022   BUN 29 (H) 10/14/2022   CREATININE 3.16 (H) 10/14/2022   GLUCOSE 141 (H) 10/14/2022   GFRNONAA 20 (L) 10/14/2022   GFRAA 33 (L) 12/12/2019   CALCIUM 8.0 (L) 10/14/2022   PHOS 2.8 09/18/2022   PROT 5.9 (L) 10/13/2022   ALBUMIN 3.3 (L) 10/13/2022   LABGLOB 3.2 12/26/2016   AGRATIO 1.2 12/26/2016   BILITOT 0.6 10/13/2022   ALKPHOS 60 10/13/2022   AST 38 10/13/2022   ALT 50 (H) 10/13/2022   ANIONGAP 8 10/14/2022    GFR: Estimated Creatinine Clearance: 20.4 mL/min (A) (by C-G formula based on SCr of 3.16 mg/dL (H)).  No results found for this or any previous visit (from the past 240 hour(s)).    Radiology Studies: CT ABDOMEN PELVIS WO CONTRAST  Result Date: 10/13/2022 CLINICAL DATA:  Abdominal pain, acute, nonlocalized EXAM: CT ABDOMEN AND PELVIS WITHOUT CONTRAST TECHNIQUE: Multidetector CT imaging of the abdomen and pelvis was performed following the standard protocol without IV contrast. RADIATION DOSE REDUCTION: This exam was performed according to the departmental dose-optimization program which includes automated exposure control, adjustment of the mA and/or kV according to patient size and/or use of iterative reconstruction technique. COMPARISON:  11/11/2021 FINDINGS: Lower chest: Extensive right coronary artery calcification. Hypoattenuation of the cardiac blood pool is in keeping with at least moderate anemia. Cardiac size within normal limits. Visualized lung bases are clear. Hepatobiliary: No focal liver abnormality is seen. No gallstones, gallbladder wall thickening, or biliary dilatation. Pancreas: Unremarkable Spleen: Unremarkable Adrenals/Urinary Tract: Adrenal glands are unremarkable. Kidneys are normal, without renal calculi, focal lesion, or hydronephrosis. Bladder is unremarkable. Stomach/Bowel: The stomach, small bowel, and large bowel are unremarkable. No evidence of obstruction or focal inflammation. The appendix  is not clearly identified and may be absent. No free intraperitoneal gas or fluid. Vascular/Lymphatic: Aortic atherosclerosis. No enlarged abdominal or pelvic lymph nodes. Reproductive: Surgical changes of trans urethral section are noted within the central prostate gland. The prostate gland is not enlarged. Other: Tiny fat containing umbilical hernia. Musculoskeletal: Posterior lumbar fusion L5-S1 noted with cerclage wires and spinous process clamps noted. Degenerative changes noted throughout the remainder of the thoracolumbar spine. No acute bone abnormality. No lytic or blastic bone lesion. IMPRESSION: 1. No acute intra-abdominal pathology identified. No definite radiographic explanation for the patient's reported symptoms. 2. Extensive right coronary artery calcification. 3. Hypoattenuation of the cardiac blood pool in keeping with at least moderate anemia. 4. Aortic atherosclerosis. Aortic Atherosclerosis (ICD10-I70.0). Electronically Signed   By: Fidela Salisbury M.D.   On: 10/13/2022 20:48   DG Abd Portable 1V  Result Date: 10/12/2022 CLINICAL DATA:  Abdominal tenderness EXAM: PORTABLE ABDOMEN - 1 VIEW COMPARISON:  None Available. FINDINGS: Lung bases are normal. No free air, portal venous gas, or pneumatosis. There is a paucity of bowel gas limiting evaluation but no evidence of obstruction or abnormal fecal loading. Phleboliths are identified in the pelvis. No other abnormalities. IMPRESSION: No cause for the patient's abdominal pain identified. Electronically Signed   By: Dorise Bullion III M.D.   On: 10/12/2022 15:56      LOS: 34 days    Cordelia Poche, MD [facility] 10/14/2022, 12:55 PM   If 7PM-7AM, please contact night-coverage [email or web address]

## 2022-10-14 NOTE — Progress Notes (Signed)
Occupational Therapy Treatment Patient Details Name: Michael [name] MRN: [id number] DOB: [birth year] Today's Date: 10/14/2022   History of present illness 72 y.o. male admitted 09/09/22 with chest pain, AKI on CKD stage IIIb, Suicidal ideations in the setting of depression. Recently hospitalized 11/30-12/1/23. PMHx:HTN, chronic HFrEF with recovered LVEF 60 to 65%, PAF on Eliquis, CAD s/p CABG and stenting, DM, CKD stage IIIb, HIV on HAART, and cocaine abuse.   OT comments  STAR Patient OT session:  Patient continues to perform well with OT treatment with supervision for shower transfers and bathing in shower. Patient able to perform dressing with setup and distant supervision. Patient appears limited due to back pain and dizziness with mobility. Patient to continue to be followed by acute OT with STAR program.    Recommendations for follow up therapy are one component of a multi-disciplinary discharge planning process, led by the attending physician.  Recommendations may be updated based on patient status, additional functional criteria and insurance authorization.    Follow Up Recommendations  Other (comment) (drug rehab)     Assistance Recommended at Discharge Intermittent Supervision/Assistance  Patient can return home with the following  Assist for transportation;A little help with walking and/or transfers;A little help with bathing/dressing/bathroom   Equipment Recommendations  None recommended by OT    Recommendations for Other Services      Precautions / Restrictions Precautions Precautions: Fall Restrictions Weight Bearing Restrictions: No       Mobility Bed Mobility Overal bed mobility: Needs Assistance Bed Mobility: Sit to Supine     Supine to sit: Supervision     General bed mobility comments: supervision for safety    Transfers Overall transfer level: Needs assistance Equipment used: Rollator (4 wheels) (platform rollator) Transfers: Sit to/from  Stand Sit to Stand: Min guard           General transfer comment: min guard for safety     Balance Overall balance assessment: Needs assistance Sitting-balance support: No upper extremity supported, Feet supported Sitting balance-Leahy Scale: Good     Standing balance support: No upper extremity supported, During functional activity Standing balance-Leahy Scale: Fair Standing balance comment: able to stand for self care tasks without UE support                           ADL either performed or assessed with clinical judgement   ADL Overall ADL's : Needs assistance/impaired     Grooming: Wash/dry hands;Wash/dry face;Oral care;Supervision/safety;Standing Grooming Details (indicate cue type and reason): at sink Upper Body Bathing: Supervision/ safety;Sitting Upper Body Bathing Details (indicate cue type and reason): in shower Lower Body Bathing: Sit to/from stand;Supervison/ safety Lower Body Bathing Details (indicate cue type and reason): supervision for safety in shower Upper Body Dressing : Sitting;Supervision/safety Upper Body Dressing Details (indicate cue type and reason): distant supervision   Lower Body Dressing Details (indicate cue type and reason): distant supervision         Tub/ Shower Transfer: Supervision/safety;Walk-in Lobbyist Details (indicate cue type and reason): supervision for safety   General ADL Comments: distant supervision for dressing with setup provided    Extremity/Trunk Assessment              Vision       Perception     Praxis      Cognition Arousal/Alertness: Awake/alert Behavior During Therapy: WFL for tasks assessed/performed Overall Cognitive Status: Within Functional Limits for tasks assessed  Exercises Exercises: General Upper Extremity General Exercises - Upper Extremity Shoulder Flexion: Strengthening, Both, 15 reps, Seated,  Theraband Theraband Level (Shoulder Flexion): Level 2 (Red) Shoulder ABduction: Strengthening, Both, 15 reps, Seated, Theraband Theraband Level (Shoulder Abduction): Level 2 (Red) Elbow Flexion: Strengthening, Both, 15 reps, Seated, Theraband Theraband Level (Elbow Flexion): Level 2 (Red) Elbow Extension: Strengthening, Both, 15 reps, Seated, Theraband Theraband Level (Elbow Extension): Level 2 (Red)    Shoulder Instructions       General Comments      Pertinent Vitals/ Pain       Pain Assessment Pain Assessment: Faces Faces Pain Scale: Hurts little more Pain Location: back after ambulation Pain Descriptors / Indicators: Discomfort, Grimacing Pain Intervention(s): Monitored during session, Repositioned, Heat applied  Home Living                                          Prior Functioning/Environment              Frequency  Min 5X/week        Progress Toward Goals  OT Goals(current goals can now be found in the care plan section)  Progress towards OT goals: Progressing toward goals  Acute Rehab OT Goals Patient Stated Goal: get better OT Goal Formulation: With patient Time For Goal Achievement: 10/15/22 Potential to Achieve Goals: Good ADL Goals Pt Will Perform Grooming: with supervision;standing Pt Will Perform Lower Body Dressing: with modified independence;sit to/from stand Pt Will Transfer to Toilet: with modified independence;ambulating Pt Will Perform Tub/Shower Transfer: Shower transfer;with modified independence;3 in 1 Pt/caregiver will Perform Home Exercise Program: Increased strength;Both right and left upper extremity;With written HEP provided;Independently Additional ADL Goal #1: Pt will independently verbalize 3 strategies to reduce risk of falls  Plan Discharge plan remains appropriate;Frequency remains appropriate    Co-evaluation                 AM-PAC OT "6 Clicks" Daily Activity     Outcome Measure   Help from  another person eating meals?: None Help from another person taking care of personal grooming?: A Little Help from another person toileting, which includes using toliet, bedpan, or urinal?: A Little Help from another person bathing (including washing, rinsing, drying)?: A Little Help from another person to put on and taking off regular upper body clothing?: A Little Help from another person to put on and taking off regular lower body clothing?: A Little 6 Click Score: 19    End of Session Equipment Utilized During Treatment: Gait belt;Rollator (4 wheels);Other (comment) (platform rollator)  OT Visit Diagnosis: Unsteadiness on feet (R26.81);Muscle weakness (generalized) (M62.81)   Activity Tolerance Patient tolerated treatment well   Patient Left in chair;with call bell/phone within reach;with chair alarm set   Nurse Communication Mobility status        Time: 0762-2633 OT Time Calculation (min): 44 min  Charges: OT General Charges $OT Visit: 1 Visit OT Treatments $Self Care/Home Management : 23-37 mins $Therapeutic Exercise: 8-22 mins  Lodema Hong, [place]  Office [phone]   Trixie Dredge 10/14/2022, 9:09 AM

## 2022-10-14 NOTE — TOC Progression Note (Addendum)
Transition of Care [facility]) - Progression Note    Patient Details  Name: Michael [name] MRN: [id number] Date of Birth: December 10, 1950  Transition of Care [facility]) CM/SW Contact  Tom-Johnson, Renea Ee, RN Phone Number: 10/14/2022, 2:48 PM  Clinical Narrative:     Patient progressing with the STAR program. Per OT with the STAR program, patient is doing very well with the program, setup to supervision for self care. He does well with short distances, so a hotel would be good at discharge. TOC will continue to follow.      Expected Discharge Plan: [place] Barriers to Discharge: Continued Medical Work up, SNF Pending bed offer  Expected Discharge Plan and Services In-house Referral: Clinical Social Work   Post Acute Care Choice: [place] Living arrangements for the past 2 months: Hotel/Motel                                       Social Determinants of Health (SDOH) Interventions [place]: Food Insecurity Present (09/10/2022)  Housing: Medium Risk (09/10/2022)  Transportation Needs: Unmet Transportation Needs (09/10/2022)  Utilities: At Risk (09/10/2022)  Alcohol Screen: Low Risk  (06/15/2019)  Depression (PHQ2-9): High Risk (11/12/2021)  Financial Resource Strain: Medium Risk (09/16/2018)  Physical Activity: Unknown (04/22/2019)  Social Connections: Socially Isolated (09/16/2018)  Stress: Stress Concern Present (09/16/2018)  Tobacco Use: Low Risk  (09/09/2022)    Readmission Risk Interventions    07/05/2022   10:08 AM  Readmission Risk Prevention Plan  Transportation Screening Complete  Medication Review ([place]) Complete  PCP or Specialist appointment within 3-5 days of discharge Complete  HRI or [place] Complete  SW Recovery Care/Counseling Consult Complete  [place] Not Applicable

## 2022-10-15 NOTE — Progress Notes (Signed)
PROGRESS NOTE    Michael [name]  DUK:[id number] DOB: 04/05/1951 DOA: 09/09/2022 PCP: [name], MD   Brief Narrative: Michael [name] is a 72 y.o. male with a history of hypertension, chronic heart failure, paroxysmal atrial fibrillation, CAD s/p CABG/PCI, CKD, HIV, cocaine abuse. [name]. Admission complicated by significant AKI that responded to IV fluids. Discharge complicated by inability to place patient at a SNF.    Assessment and Plan: Acute kidney injury superimposed on chronic kidney disease ([place]) Creatinine of 6.32 on admission. Associated BUN of 81. Patient initially started on IV fluids with improvement of renal function towards baseline. Although In/Out show overall net positive, weight has significantly declined since admission. Creatinine continues to rise with cessation of IV fluids. IV fluids restarted. Creatinine stable.  Essential hypertension -Continue diltiazem, hydralazine, and Imdur  Paroxysmal A-fib ([place]) Patient is not on anticoagulation secondary to history of high bleeding risk -Continue diltiazem  (HFpEF) heart failure with preserved ejection fraction ([name]) Stable. Not on diuretic therapy.  HIV (human immunodeficiency virus infection) ([place]) -Continue Juluca  History of pulmonary embolism Noted. Not on anticoagulation.  Type 2 diabetes mellitus with hyperlipidemia ([name]) Hemoglobin A1C of 7.7%. Adequately controlled for age.   Polysubstance abuse ([place]) Noted. History of cocaine use.  Suicidal ideations Evaluated by psychiatry. No role for inpatient admission. -Continue trazodone and fluoxetine  COPD (chronic obstructive pulmonary disease) ([name]) -Continue Incruse Ellipta, Dulera and Duoneb PRN  Hyperkalemia Likely secondary to kidney disease. Mild. Improved.  CKD (chronic kidney disease), stage IV ([name]) Baseline serum creatinine appears to be around 2.2-2.4.  Patient will need nephrology follow-up as an outpatient.  Back pain Patient appears to have a history of lumbar spinal stenosis. He states a history of back surgery but is unable to tell me what was performed. Lumbar x-ray without acute fracture. Pain improved with Norco. -Continue Norco PRN  Abdominal pain-resolved as of 10/14/2022 None specific. No constipation or diarrhea. No nausea/vomiting. Having bowel movements. Normal bowel sounds.  Generalized tenderness. Abdominal x-ray unremarkable. Not relieved with bowel movements. CT abdomen obtained and was without etiology. Symptoms now resolved.   DVT prophylaxis: Heparin subq Code Status:   Code Status: Full Code Family Communication: None at bedside Disposition Plan: Discharge pending bed availability vs improvement of functional ability to go home   Consultants:  Psychiatry Infectious disease  Procedures:    Antimicrobials:     Subjective: No issues noted overnight  Objective: BP (!) 170/86 (BP Location: Right Arm)   Pulse 74   Temp 98 F (36.7 C) (Oral)   Resp 17   Ht '5\' 8"'$  (1.727 m)   Wt 76.5 kg   SpO2 98%   BMI 25.64 kg/m   Examination:  General: Well appearing, no distress. Walking down the hallway with walker.   Data Reviewed: I have personally reviewed following labs and imaging studies  CBC Lab Results  Component Value Date   WBC 3.8 (L) 10/13/2022   RBC 2.70 (L) 10/13/2022   HGB 7.9 (L) 10/13/2022   HCT 23.1 (L) 10/13/2022   MCV 85.6 10/13/2022   MCH 29.3 10/13/2022   PLT 103 (L) 10/13/2022   MCHC 34.2 10/13/2022   RDW 14.4 10/13/2022   LYMPHSABS 0.9 09/05/2022   MONOABS 0.5 09/05/2022   EOSABS 0.1 09/05/2022   BASOSABS 0.0 37/62/8315     Last metabolic panel Lab Results  Component Value Date   NA 138 10/14/2022  K 4.3 10/14/2022   CL 103 10/14/2022   CO2 27 10/14/2022   BUN 29 (H) 10/14/2022   CREATININE 3.16 (H) 10/14/2022   GLUCOSE 141 (H) 10/14/2022   GFRNONAA 20 (L)  10/14/2022   GFRAA 33 (L) 12/12/2019   CALCIUM 8.0 (L) 10/14/2022   PHOS 2.8 09/18/2022   PROT 5.9 (L) 10/13/2022   ALBUMIN 3.3 (L) 10/13/2022   LABGLOB 3.2 12/26/2016   AGRATIO 1.2 12/26/2016   BILITOT 0.6 10/13/2022   ALKPHOS 60 10/13/2022   AST 38 10/13/2022   ALT 50 (H) 10/13/2022   ANIONGAP 8 10/14/2022    GFR: Estimated Creatinine Clearance: 20.4 mL/min (A) (by C-G formula based on SCr of 3.16 mg/dL (H)).  No results found for this or any previous visit (from the past 240 hour(s)).    Radiology Studies: CT ABDOMEN PELVIS WO CONTRAST  Result Date: 10/13/2022 CLINICAL DATA:  Abdominal pain, acute, nonlocalized EXAM: CT ABDOMEN AND PELVIS WITHOUT CONTRAST TECHNIQUE: Multidetector CT imaging of the abdomen and pelvis was performed following the standard protocol without IV contrast. RADIATION DOSE REDUCTION: This exam was performed according to the departmental dose-optimization program which includes automated exposure control, adjustment of the mA and/or kV according to patient size and/or use of iterative reconstruction technique. COMPARISON:  11/11/2021 FINDINGS: Lower chest: Extensive right coronary artery calcification. Hypoattenuation of the cardiac blood pool is in keeping with at least moderate anemia. Cardiac size within normal limits. Visualized lung bases are clear. Hepatobiliary: No focal liver abnormality is seen. No gallstones, gallbladder wall thickening, or biliary dilatation. Pancreas: Unremarkable Spleen: Unremarkable Adrenals/Urinary Tract: Adrenal glands are unremarkable. Kidneys are normal, without renal calculi, focal lesion, or hydronephrosis. Bladder is unremarkable. Stomach/Bowel: The stomach, small bowel, and large bowel are unremarkable. No evidence of obstruction or focal inflammation. The appendix is not clearly identified and may be absent. No free intraperitoneal gas or fluid. Vascular/Lymphatic: Aortic atherosclerosis. No enlarged abdominal or pelvic lymph  nodes. Reproductive: Surgical changes of trans urethral section are noted within the central prostate gland. The prostate gland is not enlarged. Other: Tiny fat containing umbilical hernia. Musculoskeletal: Posterior lumbar fusion L5-S1 noted with cerclage wires and spinous process clamps noted. Degenerative changes noted throughout the remainder of the thoracolumbar spine. No acute bone abnormality. No lytic or blastic bone lesion. IMPRESSION: 1. No acute intra-abdominal pathology identified. No definite radiographic explanation for the patient's reported symptoms. 2. Extensive right coronary artery calcification. 3. Hypoattenuation of the cardiac blood pool in keeping with at least moderate anemia. 4. Aortic atherosclerosis. Aortic Atherosclerosis (ICD10-I70.0). Electronically Signed   By: Fidela Salisbury M.D.   On: 10/13/2022 20:48      LOS: 35 days    Cordelia Poche, MD [facility] 10/15/2022, 11:13 AM   If 7PM-7AM, please contact night-coverage [email or web address]

## 2022-10-15 NOTE — Progress Notes (Signed)
Physical Therapy Treatment Patient Details Name: Michael [name] MRN: [id number] DOB: [birth year] Today's Date: 10/15/2022   History of Present Illness 72 y.o. male admitted 09/09/22 with chest pain, AKI on CKD stage IIIb, Suicidal ideations in the setting of depression. Recently hospitalized 11/30-12/1/23. PMHx:HTN, chronic HFrEF with recovered LVEF 60 to 65%, PAF on Eliquis, CAD s/p CABG and stenting, DM, CKD stage IIIb, HIV on HAART, and cocaine abuse.    PT Comments    Patient eager to work with PT this morning although a little tired from seeing OT earlier. Pt reports mild nausea. He would like to work on gait with upright rollator to be more confident and safe walking with staffing. Encouraged further training with [facility] for d/c planning if rehab requires independence with lesser device (noted from prior notes.) Tolerating greater distance at 250 feet today, slow with cues for gait symmetry and upright posture, supervision level for safety. Goals update to increase independence. Patient will continue to benefit from skilled physical therapy services to further improve independence with functional mobility.    Recommendations for follow up therapy are one component of a multi-disciplinary discharge planning process, led by the attending physician.  Recommendations may be updated based on patient status, additional functional criteria and insurance authorization.  Follow Up Recommendations  Other (comment) (drug rehab) Can patient physically be transported by private vehicle: Yes   Assistance Recommended at Discharge Set up Supervision/Assistance  Patient can return home with the following A little help with walking and/or transfers;A little help with bathing/dressing/bathroom;Direct supervision/assist for medications management;Direct supervision/assist for financial management;Assist for transportation;Assistance with cooking/housework   Equipment Recommendations  Other (comment) (upright  rollator)    Recommendations for Other Services Other (comment)     Precautions / Restrictions Precautions Precautions: Fall Restrictions Weight Bearing Restrictions: No     Mobility  Bed Mobility               General bed mobility comments: in recliner    Transfers Overall transfer level: Needs assistance Equipment used: Rollator (4 wheels) (upright) Transfers: Sit to/from Stand Sit to Stand: Min guard           General transfer comment: min guard for safety, good hand placement. Extra time.    Ambulation/Gait Ambulation/Gait assistance: Supervision Gait Distance (Feet): 250 Feet Assistive device: Rollator (4 wheels) (upright) Gait Pattern/deviations: Step-through pattern, Decreased step length - right, Decreased step length - left, Trunk flexed, Narrow base of support Gait velocity: slowed Gait velocity interpretation: <1.31 ft/sec, indicative of household ambulator   General Gait Details: Per pt request, further training with 4ww to use with staff confidently. Supervision for safety. Cues to increase width of feet with steps. Shows greater step length compared to [facility]. Fatigues and leans forward but able to correct with cues. Increased tolerance with distance. No buckling or LOB noted with this device. Some trouble navigating in congested areas while turning around and backing up.   Stairs             Wheelchair Mobility    Modified Rankin (Stroke Patients Only)       Balance Overall balance assessment: Needs assistance Sitting-balance support: No upper extremity supported, Feet supported Sitting balance-Leahy Scale: Good       Standing balance-Leahy Scale: Fair                              Cognition Arousal/Alertness: Awake/alert Behavior During Therapy: WFL for tasks assessed/performed  Overall Cognitive Status: Within Functional Limits for tasks assessed                                          Exercises       General Comments General comments (skin integrity, edema, etc.): Wore occlusion glasses during bout.      Pertinent Vitals/Pain Pain Assessment Pain Assessment: No/denies pain Pain Intervention(s): Monitored during session    Home Living                          Prior Function            PT Goals (current goals can now be found in the care plan section) Acute Rehab PT Goals Patient Stated Goal: Wants to get better with 4ww upright PT Goal Formulation: With patient Time For Goal Achievement: 10/29/22 Potential to Achieve Goals: Good Progress towards PT goals: Progressing toward goals    Frequency    Min 3X/week      PT Plan Current plan remains appropriate    Co-evaluation              AM-PAC PT "6 Clicks" Mobility   Outcome Measure  Help needed turning from your back to your side while in a flat bed without using bedrails?: None Help needed moving from lying on your back to sitting on the side of a flat bed without using bedrails?: A Little Help needed moving to and from a bed to a chair (including a wheelchair)?: A Little Help needed standing up from a chair using your arms (e.g., wheelchair or bedside chair)?: A Little Help needed to walk in hospital room?: A Little Help needed climbing 3-5 steps with a railing? : A Lot 6 Click Score: 18    End of Session Equipment Utilized During Treatment: Gait belt Activity Tolerance: Patient tolerated treatment well Patient left: in chair;with call bell/phone within reach;with chair alarm set Nurse Communication: Mobility status PT Visit Diagnosis: Unsteadiness on feet (R26.81);Other abnormalities of gait and mobility (R26.89);History of falling (Z91.81);Muscle weakness (generalized) (M62.81);Difficulty in walking, not elsewhere classified (R26.2)     Time: 4098-1191 PT Time Calculation (min) (ACUTE ONLY): 30 min  Charges:  $Gait Training: 23-37 mins                     [name], PT,  DPT Physical Therapist Acute Rehabilitation Services [place]    Ellouise Newer 10/15/2022, 9:12 AM

## 2022-10-15 NOTE — Progress Notes (Signed)
Mobility Specialist Progress Note:    10/15/22 1200  Mobility  Activity Ambulated with assistance in hallway  Level of Assistance Contact guard assist, steadying assist  Assistive Device Four wheel walker (Upright)  Distance Ambulated (ft) 450 ft  Activity Response Tolerated well  Mobility Referral Yes  $Mobility charge 1 Mobility   Pt was agreeable for mobility session. Took 1 seated rest break half-way through session. C/o feeling cold, onset fever,nausea, and back pain. Left pt in room with all needs met.   Royetta Crochet Mobility Specialist Please contact via [employer] or  Rehab office at [phone]

## 2022-10-15 NOTE — Progress Notes (Signed)
Occupational Therapy Treatment Patient Details Name: Michael [name] MRN: [id number] DOB: Sep 02, 1951 Today's Date: 10/15/2022   History of present illness 72 y.o. male admitted 09/09/22 with chest pain, AKI on CKD stage IIIb, Suicidal ideations in the setting of depression. Recently hospitalized 11/30-12/1/23. PMHx:HTN, chronic HFrEF with recovered LVEF 60 to 65%, PAF on Eliquis, CAD s/p CABG and stenting, DM, CKD stage IIIb, HIV on HAART, and cocaine abuse.   OT comments  [facility] OT session: OTR present for partial part of session to discuss updating goals. Patient seen for OT to address self care with distant supervision standing at sink and dressing seated/standing from EOB to simulate possible home environment. Patient performed mobility and item retrieval using straight cane for support and min guard to supervision for safety. Patient continues to have complaints of back pain and dizziness and/or blurry vision. Patient to continue to be seen by acute OT to progress to MI for self care and functional transfers.    Recommendations for follow up therapy are one component of a multi-disciplinary discharge planning process, led by the attending physician.  Recommendations may be updated based on patient status, additional functional criteria and insurance authorization.    Follow Up Recommendations  Other (comment) (drug rehab)     Assistance Recommended at Discharge Intermittent Supervision/Assistance  Patient can return home with the following  Assist for transportation;A little help with walking and/or transfers;A little help with bathing/dressing/bathroom   Equipment Recommendations  None recommended by OT    Recommendations for Other Services      Precautions / Restrictions Precautions Precautions: Fall Restrictions Weight Bearing Restrictions: No       Mobility Bed Mobility Overal bed mobility: Needs Assistance Bed Mobility: Sit to Supine     Supine to sit:  Modified independent (Device/Increase time)     General bed mobility comments: able to get to EOB without assistance    Transfers Overall transfer level: Needs assistance Equipment used: Straight cane Transfers: Sit to/from Stand Sit to Stand: Min guard           General transfer comment: min guard to supervision for mobility and transfers in room with straight cane     Balance Overall balance assessment: Needs assistance Sitting-balance support: No upper extremity supported, Feet supported Sitting balance-Leahy Scale: Good     Standing balance support: No upper extremity supported, During functional activity Standing balance-Leahy Scale: Fair Standing balance comment: able to stand for self care tasks without UE support, reliant on at least one extremity support for dynamic standing                           ADL either performed or assessed with clinical judgement   ADL Overall ADL's : Needs assistance/impaired     Grooming: Wash/dry hands;Wash/dry face;Oral care;Supervision/safety;Standing Grooming Details (indicate cue type and reason): distant supervision Upper Body Bathing: Supervision/ safety;Standing Upper Body Bathing Details (indicate cue type and reason): stood at sink for UB bathing with distant supervision Lower Body Bathing: Sit to/from stand;Supervison/ safety Lower Body Bathing Details (indicate cue type and reason): distant supervision standing at sink Upper Body Dressing : Sitting;Supervision/safety Upper Body Dressing Details (indicate cue type and reason): distant supervision Lower Body Dressing: Supervision/safety;Sit to/from stand Lower Body Dressing Details (indicate cue type and reason): distant supervision               General ADL Comments: item retrieval performed in room with straight cane and min guard  to supervision    Extremity/Trunk Assessment              Vision       Perception     Praxis      Cognition  Arousal/Alertness: Awake/alert Behavior During Therapy: WFL for tasks assessed/performed Overall Cognitive Status: Within Functional Limits for tasks assessed                                          Exercises      Shoulder Instructions       General Comments      Pertinent Vitals/ Pain       Pain Assessment Pain Assessment: Faces Faces Pain Scale: Hurts little more Pain Location: back after ambulation Pain Descriptors / Indicators: Discomfort, Grimacing Pain Intervention(s): Monitored during session, Repositioned, Heat applied  Home Living                                          Prior Functioning/Environment              Frequency  Min 5X/week        Progress Toward Goals  OT Goals(current goals can now be found in the care plan section)  Progress towards OT goals: Progressing toward goals  Acute Rehab OT Goals Patient Stated Goal: get better OT Goal Formulation: With patient Time For Goal Achievement: 10/29/22 Potential to Achieve Goals: Good ADL Goals Pt Will Perform Grooming: with supervision;standing Pt Will Perform Lower Body Dressing: with modified independence;sit to/from stand Pt Will Transfer to Toilet: with modified independence;ambulating Pt Will Perform Tub/Shower Transfer: Shower transfer;with modified independence;3 in 1 Pt/caregiver will Perform Home Exercise Program: Increased strength;Both right and left upper extremity;With written HEP provided;Independently Additional ADL Goal #1: Pt will independently verbalize 3 strategies to reduce risk of falls  Plan Discharge plan remains appropriate;Frequency remains appropriate    Co-evaluation                 AM-PAC OT "6 Clicks" Daily Activity     Outcome Measure   Help from another person eating meals?: None Help from another person taking care of personal grooming?: A Little Help from another person toileting, which includes using toliet,  bedpan, or urinal?: A Little Help from another person bathing (including washing, rinsing, drying)?: A Little Help from another person to put on and taking off regular upper body clothing?: A Little Help from another person to put on and taking off regular lower body clothing?: A Little 6 Click Score: 19    End of Session Equipment Utilized During Treatment: Other (comment) (straight cane)  OT Visit Diagnosis: Unsteadiness on feet (R26.81);Muscle weakness (generalized) (M62.81)   Activity Tolerance Patient tolerated treatment well   Patient Left in chair;with call bell/phone within reach;with chair alarm set   Nurse Communication Mobility status        Time: 5277-8242 OT Time Calculation (min): 46 min  Charges: OT General Charges $OT Visit: 1 Visit OT Treatments $Self Care/Home Management : 23-37 mins  Lodema Hong, [place]  Office [place] 10/15/2022, 8:54 AM

## 2022-10-15 NOTE — Progress Notes (Signed)
Occupational Therapy Treatment Patient Details Name: Michael [name] MRN: [id number] DOB: 24-May-1951 Today's Date: 10/15/2022   History of present illness 72 y.o. male admitted 09/09/22 with chest pain, AKI on CKD stage IIIb, Suicidal ideations in the setting of depression. Recently hospitalized 11/30-12/1/23. PMHx:HTN, chronic HFrEF with recovered LVEF 60 to 65%, PAF on Eliquis, CAD s/p CABG and stenting, DM, CKD stage IIIb, HIV on HAART, and cocaine abuse.   OT comments  Patient seen in conjunction with COTA for review of treatment plan, revision of patient goals as needed, and continuation of discharge disposition.  Primary deficits are unsteadiness, decreased safety, and dizziness (? Double vision).  Patient in agreement with plan of care discussed, discharge disposition remains as planned, and patient with no questions.  OT continues to be needed in the acute setting to maximize his function for eventual return to the community.    Recommendations for follow up therapy are one component of a multi-disciplinary discharge planning process, led by the attending physician.  Recommendations may be updated based on patient status, additional functional criteria and insurance authorization.    Follow Up Recommendations  Other (comment) (drug rehab)     Assistance Recommended at Discharge Intermittent Supervision/Assistance  Patient can return home with the following  Assist for transportation;A little help with walking and/or transfers;A little help with bathing/dressing/bathroom   Equipment Recommendations  None recommended by OT    Recommendations for Other Services      Precautions / Restrictions Precautions Precautions: Fall Restrictions Weight Bearing Restrictions: No              ADL either performed or assessed with clinical judgement   ADL Overall ADL's : Needs assistance/impaired     Grooming: Wash/dry hands;Wash/dry face;Oral  care;Supervision/safety;Standing Grooming Details (indicate cue type and reason): distant supervision Upper Body Bathing: Supervision/ safety;Standing Upper Body Bathing Details (indicate cue type and reason): stood at sink for UB bathing with distant supervision Lower Body Bathing: Sit to/from stand;Supervison/ safety Lower Body Bathing Details (indicate cue type and reason): distant supervision standing at sink Upper Body Dressing : Sitting;Supervision/safety Upper Body Dressing Details (indicate cue type and reason): distant supervision Lower Body Dressing: Supervision/safety;Sit to/from stand Lower Body Dressing Details (indicate cue type and reason): distant supervision               General ADL Comments: Generalized supervison to [facility] with safety and compensatory cues.    Extremity/Trunk Assessment                                Cognition Arousal/Alertness: Awake/alert Behavior During Therapy: WFL for tasks assessed/performed Overall Cognitive Status: Within Functional Limits for tasks assessed                                                       General Comments Wore occlusion glasses during bout.    Pertinent Vitals/ Pain       Pain Assessment Pain Assessment: No/denies pain  Frequency  Min 5X/week        Progress Toward Goals  OT Goals(current goals can now be found in the care plan section)  Progress towards OT goals: Progressing toward goals  Acute Rehab OT Goals Patient Stated Goal: get better OT Goal Formulation: With patient Time For Goal Achievement: 10/29/22 Potential to Achieve Goals: Good ADL Goals Pt Will Perform Grooming: with supervision;standing Pt Will Perform Lower Body Bathing: with modified independence;sit to/from stand Pt Will Perform Lower Body Dressing: with modified independence;sit to/from stand Pt Will  Transfer to Toilet: with modified independence;ambulating;regular height toilet Pt Will Perform Tub/Shower Transfer: Shower transfer;with modified independence Pt/caregiver will Perform Home Exercise Program: Increased strength;Both right and left upper extremity;With written HEP provided;Independently Additional ADL Goal #1: Pt will independently verbalize 3 strategies to reduce risk of falls  Plan Discharge plan remains appropriate;Frequency remains appropriate    Co-evaluation                 AM-PAC OT "6 Clicks" Daily Activity     Outcome Measure   Help from another person eating meals?: None Help from another person taking care of personal grooming?: A Little Help from another person toileting, which includes using toliet, bedpan, or urinal?: A Little Help from another person bathing (including washing, rinsing, drying)?: A Little Help from another person to put on and taking off regular upper body clothing?: A Little Help from another person to put on and taking off regular lower body clothing?: A Little 6 Click Score: 19    End of Session Equipment Utilized During Treatment: Other (comment) (straight cane)  OT Visit Diagnosis: Unsteadiness on feet (R26.81);Muscle weakness (generalized) (M62.81)   Activity Tolerance Patient tolerated treatment well   Patient Left Other (comment) (Left with COTA for remainder of session)   Nurse Communication Mobility status        Time: 6770-3403 OT Time Calculation (min): 12 min  Charges: OT General Charges $OT Visit: 1 Visit OT Treatments $Self Care/Home Management : 8-22 mins  10/15/2022  RP, OTR/L  Acute Rehabilitation Services  Office:  [phone]   Metta Clines 10/15/2022, 9:52 AM

## 2022-10-16 DIAGNOSIS — N179 Acute kidney failure, unspecified: Secondary | ICD-10-CM | POA: Diagnosis not present

## 2022-10-16 LAB — BASIC METABOLIC PANEL
Anion gap: 7 (ref 5–15)
BUN: 28 mg/dL — ABNORMAL HIGH (ref 8–23)
CO2: 23 mmol/L (ref 22–32)
Calcium: 8.4 mg/dL — ABNORMAL LOW (ref 8.9–10.3)
Chloride: 109 mmol/L (ref 98–111)
Creatinine, Ser: 3.13 mg/dL — ABNORMAL HIGH (ref 0.61–1.24)
GFR, Estimated: 20 mL/min — ABNORMAL LOW (ref >60)
Glucose, Bld: 106 mg/dL — ABNORMAL HIGH (ref 70–99)
Potassium: 3.9 mmol/L (ref 3.5–5.1)
Sodium: 139 mmol/L (ref 135–145)

## 2022-10-16 LAB — CBC
HCT: 23.5 % — ABNORMAL LOW (ref 39.0–52.0)
Hemoglobin: 7.8 g/dL — ABNORMAL LOW (ref 13.0–17.0)
MCH: 29.4 pg (ref 26.0–34.0)
MCHC: 33.2 g/dL (ref 30.0–36.0)
MCV: 88.7 fL (ref 80.0–100.0)
Platelets: 102 10*3/uL — ABNORMAL LOW (ref 150–400)
RBC: 2.65 MIL/uL — ABNORMAL LOW (ref 4.22–5.81)
RDW: 14.2 % (ref 11.5–15.5)
WBC: 3.3 10*3/uL — ABNORMAL LOW (ref 4.0–10.5)
nRBC: 0 % (ref 0.0–0.2)

## 2022-10-16 LAB — VITAMIN B12: Vitamin B-12: 452 pg/mL (ref 180–914)

## 2022-10-16 MED ORDER — MECLIZINE HCL 25 MG PO TABS: 25.0000 mg | ORAL_TABLET | Freq: Three times a day (TID) | ORAL | Status: DC | PRN

## 2022-10-16 NOTE — Progress Notes (Signed)
PROGRESS NOTE    Michael [name]  WUJ:[id number] DOB: 1951/03/16 DOA: 09/09/2022 PCP: [name], MD   Brief Narrative: Michael [name] is a 72 y.o. male with a history of hypertension, chronic heart failure, paroxysmal atrial fibrillation, CAD s/p CABG/PCI, CKD, HIV, cocaine abuse. [name]. Admission complicated by significant AKI that responded to IV fluids. Discharge complicated by inability to place patient at a SNF.    Assessment and Plan: Acute kidney injury superimposed on chronic kidney disease ([place]) -Creatinine of 6.32 on admission.  Patient initially started on IV fluids with improvement of renal function towards baseline. Creatinine improved on IV fluids. -Cr stable 3.1   Essential hypertension -Continue diltiazem, hydralazine, and Imdur  Paroxysmal A-fib ([name]) Patient is not on anticoagulation secondary to history of high bleeding risk -Continue diltiazem  (HFpEF) heart failure with preserved ejection fraction ([name]) Stable. Not on diuretic therapy.  HIV (human immunodeficiency virus infection) ([place]) -Continue Juluca  History of pulmonary embolism Noted. Not on anticoagulation.  Type 2 diabetes mellitus with hyperlipidemia ([name]) Hemoglobin A1C of 7.7%.   Polysubstance abuse ([place]) Noted. History of cocaine use.  Suicidal ideations Evaluated by psychiatry. No role for inpatient admission. -Continue trazodone and fluoxetine  COPD (chronic obstructive pulmonary disease) ([name]) -Continue Incruse Ellipta, Dulera and Duoneb PRN  Hyperkalemia Likely secondary to kidney disease. Mild. Improved.  CKD (chronic kidney disease), stage IV ([name]) Baseline serum creatinine appears to be around 2.2-2.4. Patient will need nephrology follow-up as an outpatient.  Back pain Patient appears to have a history of lumbar spinal stenosis. He states a history of back surgery but is unable to tell me  what was performed. Lumbar x-ray without acute fracture. Pain improved with Norco. -Continue Norco PRN  Abdominal pain-resolved as of 10/14/2022 None specific. No constipation or diarrhea. No nausea/vomiting. Having bowel movements. Normal bowel sounds.  Generalized tenderness. Abdominal x-ray unremarkable. Not relieved with bowel movements. CT abdomen obtained and was without etiology. Symptoms now resolved.   DVT prophylaxis: Heparin subq Code Status:   Code Status: Full Code Family Communication: None at bedside Disposition Plan: Discharge pending bed availability vs improvement of functional ability to go home   Consultants:  Psychiatry Infectious disease  Procedures:    Antimicrobials:     Subjective: No new complaints. Mild dizziness on ambulation, he has been ambulating.   Objective: BP (!) 170/90 (BP Location: Left Arm)   Pulse 74   Temp 98.1 F (36.7 C) (Oral)   Resp 17   Ht '5\' 8"'$  (1.727 m)   Wt 76.5 kg   SpO2 98%   BMI 25.64 kg/m   Examination:  General: NAD Lungs; CTA  Data Reviewed: I have personally reviewed following labs and imaging studies  CBC Lab Results  Component Value Date   WBC 3.3 (L) 10/16/2022   RBC 2.65 (L) 10/16/2022   HGB 7.8 (L) 10/16/2022   HCT 23.5 (L) 10/16/2022   MCV 88.7 10/16/2022   MCH 29.4 10/16/2022   PLT 102 (L) 10/16/2022   MCHC 33.2 10/16/2022   RDW 14.2 10/16/2022   LYMPHSABS 0.9 09/05/2022   MONOABS 0.5 09/05/2022   EOSABS 0.1 09/05/2022   BASOSABS 0.0 95/62/1308     Last metabolic panel Lab Results  Component Value Date   NA 139 10/16/2022   K 3.9 10/16/2022   CL 109 10/16/2022   CO2 23 10/16/2022   BUN 28 (H) 10/16/2022   CREATININE 3.13 (H)  10/16/2022   GLUCOSE 106 (H) 10/16/2022   GFRNONAA 20 (L) 10/16/2022   GFRAA 33 (L) 12/12/2019   CALCIUM 8.4 (L) 10/16/2022   PHOS 2.8 09/18/2022   PROT 5.9 (L) 10/13/2022   ALBUMIN 3.3 (L) 10/13/2022   LABGLOB 3.2 12/26/2016   AGRATIO 1.2 12/26/2016    BILITOT 0.6 10/13/2022   ALKPHOS 60 10/13/2022   AST 38 10/13/2022   ALT 50 (H) 10/13/2022   ANIONGAP 7 10/16/2022    GFR: Estimated Creatinine Clearance: 20.6 mL/min (A) (by C-G formula based on SCr of 3.13 mg/dL (H)).  No results found for this or any previous visit (from the past 240 hour(s)).    Radiology Studies: No results found.    LOS: 29 days    Elmarie Shiley, MD [facility] 10/16/2022, 1:54 PM   If 7PM-7AM, please contact night-coverage [email or web address]

## 2022-10-16 NOTE — Progress Notes (Signed)
Mobility Specialist Progress Note:    10/16/22 1200  Mobility  Activity Ambulated with assistance in hallway  Level of Assistance Contact guard assist, steadying assist  Assistive Device Four wheel walker (Upright)  Distance Ambulated (ft) 450 ft  Activity Response Tolerated well  Mobility Referral Yes  $Mobility charge 1 Mobility   Pt was agreeable for mobility session. Tolerated well, asx throughout. No rest breaks taken today. Left pt in bed with alarm on and all needs met.   Royetta Crochet Mobility Specialist Please contact via Solicitor or  Rehab office at [phone]

## 2022-10-16 NOTE — Progress Notes (Signed)
Physical Therapy Treatment Patient Details Name: Michael [name] MRN: [id number] DOB: 1951-03-13 Today's Date: 10/16/2022   History of Present Illness 72 y.o. male admitted 09/09/22 with chest pain, AKI on CKD stage IIIb, Suicidal ideations in the setting of depression. Recently hospitalized 11/30-12/1/23. PMHx:HTN, chronic HFrEF with recovered LVEF 60 to 65%, PAF on Eliquis, CAD s/p CABG and stenting, DM, CKD stage IIIb, HIV on HAART, and cocaine abuse.    PT Comments    STAR PT session: Pt is making progress towards his goals, however continues to need upright Rollator for safe ambulation which makes it unlikely he will be accepted at a drug rehab facility which would be his best option. Pt continues to be limited in mobility by dizziness. NT had collected orthostatic vitals and he does not have drop in BP with positional change. Performed vestibular screen and pt does not exhibit change in dizziness with head movement. Pt does endorse less dizziness with use of only his R eye with movement. Pt will benefit from OP neuro ophthalmologist to rule out possible effect of his stroke on his vision. If pt discharges to hotel will need first floor handicap accessible accommodations to be able to use upright walker. PT will continue to follow acutely.      Recommendations for follow up therapy are one component of a multi-disciplinary discharge planning process, led by the attending physician.  Recommendations may be updated based on patient status, additional functional criteria and insurance authorization.  Follow Up Recommendations  Other (comment) (if drug rehab is not possible, and pt is to return to a hotel he will need first floor handicap accessible) Can patient physically be transported by private vehicle: Yes   Assistance Recommended at Discharge Set up Supervision/Assistance  Patient can return home with the following A little help with walking and/or transfers;A little help with  bathing/dressing/bathroom;Direct supervision/assist for medications management;Direct supervision/assist for financial management;Assist for transportation;Assistance with cooking/housework   Equipment Recommendations  Other (comment) (upright rollator)    Recommendations for Other Services Other (comment)     Precautions / Restrictions Precautions Precautions: Fall Restrictions Weight Bearing Restrictions: No     Mobility  Bed Mobility Overal bed mobility: Modified Independent             General bed mobility comments: able to get in and out of a flattened bed with    Transfers Overall transfer level: Needs assistance Equipment used: Rollator (4 wheels) (upright) Transfers: Sit to/from Stand Sit to Stand: Min guard           General transfer comment: min guard for safety, good hand placement. Extra time.    Ambulation/Gait Ambulation/Gait assistance: Supervision Gait Distance (Feet): 400 Feet Assistive device: Rollator (4 wheels) (upright) Gait Pattern/deviations: Step-through pattern, Decreased step length - right, Decreased step length - left, Trunk flexed, Narrow base of support Gait velocity: slowed Gait velocity interpretation: <1.31 ft/sec, indicative of household ambulator   General Gait Details: Worked with pt using upright Rollator. Pt is supervision for safety. When pt fatigues and starts to lean over, PT ask him to perform safe seated rest break in Rollator. Pt able to utilize wall for support and safely turn around and sit down to rest. Once rested pt able to get up, safely turn around and maneuver Rollator away from wall. With walking back to room pt noted to self correct narrow BoS.       Balance Overall balance assessment: Needs assistance Sitting-balance support: No upper extremity supported, Feet supported Sitting  balance-Leahy Scale: Good     Standing balance support: No upper extremity supported, During functional activity Standing  balance-Leahy Scale: Fair                              Cognition Arousal/Alertness: Awake/alert Behavior During Therapy: WFL for tasks assessed/performed Overall Cognitive Status: Within Functional Limits for tasks assessed                                             General Comments General comments (skin integrity, edema, etc.): Pt with report of dizziness. While resting PT had pt place hand over one eye and then the other. Pt reports blurry vision with use of L eye and clear vision with use of R eye. Fully occluded L eye on his glasses and pt report dizziness but less dizziness.      Pertinent Vitals/Pain Pain Assessment Pain Assessment: Faces Faces Pain Scale: Hurts little more Pain Location: back after ambulation Pain Descriptors / Indicators: Discomfort, Grimacing Pain Intervention(s): Monitored during session, Heat applied, Limited activity within patient's tolerance     PT Goals (current goals can now be found in the care plan section) Acute Rehab PT Goals Patient Stated Goal: Wants to get better with 4ww upright PT Goal Formulation: With patient Time For Goal Achievement: 10/29/22 Potential to Achieve Goals: Good Progress towards PT goals: Progressing toward goals    Frequency    Min 3X/week      PT Plan Current plan remains appropriate       AM-PAC PT "6 Clicks" Mobility   Outcome Measure  Help needed turning from your back to your side while in a flat bed without using bedrails?: None Help needed moving from lying on your back to sitting on the side of a flat bed without using bedrails?: A Little Help needed moving to and from a bed to a chair (including a wheelchair)?: A Little Help needed standing up from a chair using your arms (e.g., wheelchair or bedside chair)?: A Little Help needed to walk in hospital room?: A Little Help needed climbing 3-5 steps with a railing? : A Lot 6 Click Score: 18    End of Session  Equipment Utilized During Treatment: Gait belt Activity Tolerance: Patient tolerated treatment well Patient left: in chair;with call bell/phone within reach;with chair alarm set Nurse Communication: Mobility status PT Visit Diagnosis: Unsteadiness on feet (R26.81);Other abnormalities of gait and mobility (R26.89);History of falling (Z91.81);Muscle weakness (generalized) (M62.81);Difficulty in walking, not elsewhere classified (R26.2)     Time: 0315-9458 PT Time Calculation (min) (ACUTE ONLY): 34 min  Charges:  $Gait Training: 8-22 mins $Therapeutic Activity: 8-22 mins                     Ashtin Melichar B. Migdalia Dk PT, DPT Acute Rehabilitation Services Please use secure chat or  Call Office [phone]    [place] 10/16/2022, 4:54 PM

## 2022-10-16 NOTE — Progress Notes (Signed)
Occupational Therapy Treatment Patient Details Name: Michael [name] MRN: [id number] DOB: [birth year] Today's Date: 10/16/2022   History of present illness 72 y.o. male admitted 09/09/22 with chest pain, AKI on CKD stage IIIb, Suicidal ideations in the setting of depression. Recently hospitalized 11/30-12/1/23. PMHx:HTN, chronic HFrEF with recovered LVEF 60 to 65%, PAF on Eliquis, CAD s/p CABG and stenting, DM, CKD stage IIIb, HIV on HAART, and cocaine abuse.   OT comments  STAR Program OT session: Patient continues to perform bathing and dressing with distant supervision with patient standing at sink for bathing and seated/standing from EOB to dress. Patient performed functional mobility and functional transfers in room with straight cane and min guard assist for safety.  Patient continues to be motivated towards therapy and will continue to be followed with STAR program to progress to Mod I for al self care tasks.    Recommendations for follow up therapy are one component of a multi-disciplinary discharge planning process, led by the attending physician.  Recommendations may be updated based on patient status, additional functional criteria and insurance authorization.    Follow Up Recommendations  Other (comment) (drug rehab)     Assistance Recommended at Discharge Intermittent Supervision/Assistance  Patient can return home with the following  Assist for transportation;A little help with walking and/or transfers;A little help with bathing/dressing/bathroom   Equipment Recommendations  None recommended by OT    Recommendations for Other Services      Precautions / Restrictions Precautions Precautions: Fall Restrictions Weight Bearing Restrictions: No       Mobility Bed Mobility Overal bed mobility: Needs Assistance Bed Mobility: Sit to Supine     Supine to sit: Modified independent (Device/Increase time)     General bed mobility comments: no assistance needed to get to  EOB    Transfers Overall transfer level: Needs assistance Equipment used: Straight cane Transfers: Sit to/from Stand Sit to Stand: Min guard           General transfer comment: min guard for safety, good hand placement. Extra time.     Balance Overall balance assessment: Needs assistance Sitting-balance support: No upper extremity supported, Feet supported Sitting balance-Leahy Scale: Good     Standing balance support: No upper extremity supported, During functional activity Standing balance-Leahy Scale: Fair Standing balance comment: able to stand for self care tasks without UE support, reliant on at least one extremity support for dynamic standing                           ADL either performed or assessed with clinical judgement   ADL Overall ADL's : Needs assistance/impaired     Grooming: Wash/dry hands;Wash/dry face;Oral care;Supervision/safety;Standing Grooming Details (indicate cue type and reason): distant supervision Upper Body Bathing: Supervision/ safety;Standing Upper Body Bathing Details (indicate cue type and reason): stood at sink for UB bathing with distant supervision Lower Body Bathing: Sit to/from stand;Supervison/ safety Lower Body Bathing Details (indicate cue type and reason): distant supervision standing at sink Upper Body Dressing : Sitting;Supervision/safety Upper Body Dressing Details (indicate cue type and reason): distant supervision Lower Body Dressing: Supervision/safety;Sit to/from stand Lower Body Dressing Details (indicate cue type and reason): distant supervision Toilet Transfer: Min guard;Ambulation;Regular Toilet;Grab bars Toilet Transfer Details (indicate cue type and reason): patient ambulated to bathroom with straight cane and min guard assist for safety           General ADL Comments: continues to perform all ADL tasks with distant supervision  with min gaurd for ambualting to toilet for safety    Extremity/Trunk  Assessment              Vision       Perception     Praxis      Cognition Arousal/Alertness: Awake/alert Behavior During Therapy: WFL for tasks assessed/performed Overall Cognitive Status: Within Functional Limits for tasks assessed                                          Exercises      Shoulder Instructions       General Comments      Pertinent Vitals/ Pain       Pain Assessment Pain Assessment: Faces Faces Pain Scale: Hurts little more Pain Location: back after ambulation Pain Descriptors / Indicators: Discomfort, Grimacing Pain Intervention(s): Monitored during session, Repositioned, Heat applied  Home Living                                          Prior Functioning/Environment              Frequency  Min 5X/week        Progress Toward Goals  OT Goals(current goals can now be found in the care plan section)  Progress towards OT goals: Progressing toward goals  Acute Rehab OT Goals Patient Stated Goal: get better OT Goal Formulation: With patient Time For Goal Achievement: 10/29/22 Potential to Achieve Goals: Good ADL Goals Pt Will Perform Grooming: with supervision;standing Pt Will Perform Lower Body Bathing: with modified independence;sit to/from stand Pt Will Perform Lower Body Dressing: with modified independence;sit to/from stand Pt Will Transfer to Toilet: with modified independence;ambulating;regular height toilet Pt Will Perform Tub/Shower Transfer: Shower transfer;with modified independence Pt/caregiver will Perform Home Exercise Program: Increased strength;Both right and left upper extremity;With written HEP provided;Independently Additional ADL Goal #1: Pt will independently verbalize 3 strategies to reduce risk of falls  Plan Discharge plan remains appropriate;Frequency remains appropriate    Co-evaluation                 AM-PAC OT "6 Clicks" Daily Activity     Outcome Measure    Help from another person eating meals?: None Help from another person taking care of personal grooming?: A Little Help from another person toileting, which includes using toliet, bedpan, or urinal?: A Little Help from another person bathing (including washing, rinsing, drying)?: A Little Help from another person to put on and taking off regular upper body clothing?: A Little Help from another person to put on and taking off regular lower body clothing?: A Little 6 Click Score: 19    End of Session Equipment Utilized During Treatment: Other (comment) (straight cane)  OT Visit Diagnosis: Unsteadiness on feet (R26.81);Muscle weakness (generalized) (M62.81)   Activity Tolerance Patient tolerated treatment well   Patient Left in chair;with call bell/phone within reach;with chair alarm set   Nurse Communication Mobility status        Time: 0726-0812 OT Time Calculation (min): 46 min  Charges: OT General Charges $OT Visit: 1 Visit OT Treatments $Self Care/Home Management : 23-37 mins $Therapeutic Activity: 8-22 mins  Lodema Hong, [place]  Office [phone]   Trixie Dredge 10/16/2022, 9:57 AM

## 2022-10-17 MED ORDER — FERROUS SULFATE 325 (65 FE) MG PO TABS
325.0000 mg | ORAL_TABLET | Freq: Every day | ORAL | Status: DC
  Administered 2022-10-17 – 2022-10-18 (×2): 325 mg via ORAL
  Filled 2022-10-17 (×2): qty 1

## 2022-10-17 MED ORDER — VITAMIN B-12 100 MCG PO TABS
100.0000 ug | ORAL_TABLET | Freq: Every day | ORAL | Status: DC
  Administered 2022-10-17 – 2022-10-18 (×2): 100 ug via ORAL
  Filled 2022-10-17 (×2): qty 1

## 2022-10-17 NOTE — Progress Notes (Signed)
PROGRESS NOTE    Michael [name]  TIW:[id number] DOB: 08/14/1951 DOA: 09/09/2022 PCP: [name], MD   Brief Narrative: Michael [name] is a 72 y.o. male with a history of hypertension, chronic heart failure, paroxysmal atrial fibrillation, CAD s/p CABG/PCI, CKD, HIV, cocaine abuse. [name]. Admission complicated by significant AKI that responded to IV fluids. Discharge complicated by inability to place patient at a SNF.    Assessment and Plan: Acute kidney injury superimposed on chronic kidney disease ([place]) -Creatinine of 6.32 on admission.  Patient initially started on IV fluids with improvement of renal function towards baseline. Creatinine improved on IV fluids. -Cr stable 3.1 Plan to check labs tomorrow.   Essential hypertension -Continue diltiazem, hydralazine, and Imdur  Paroxysmal A-fib ([place]) Patient is not on anticoagulation secondary to history of high bleeding risk -Continue diltiazem  (HFpEF) heart failure with preserved ejection fraction ([name]) Stable. Not on diuretic therapy.  HIV (human immunodeficiency virus infection) ([place]) -Continue Juluca  History of pulmonary embolism Not on anticoagulation.  Type 2 diabetes mellitus with hyperlipidemia ([name]) Hemoglobin A1C of 7.7%.   Polysubstance abuse ([place]) History of cocaine use.  Suicidal ideations Evaluated by psychiatry. No role for inpatient admission. -Continue trazodone and fluoxetine  COPD (chronic obstructive pulmonary disease) ([name]) -Continue Incruse Ellipta, Dulera and Duoneb PRN  Hyperkalemia Likely secondary to kidney disease. Mild. Improved.  CKD (chronic kidney disease), stage IV ([name]) Baseline serum creatinine appears to be around 2.2-2.4. Patient will need nephrology follow-up as an outpatient.  Back pain Patient appears to have a history of lumbar spinal stenosis. He states a history of back surgery but is  unable to tell me what was performed. Lumbar x-ray without acute fracture. Pain improved with Norco. -Continue Norco PRN  Abdominal pain-resolved as of 10/14/2022 None specific. No constipation or diarrhea. No nausea/vomiting. Having bowel movements. Normal bowel sounds.  Generalized tenderness. Abdominal x-ray unremarkable. Not relieved with bowel movements. CT abdomen obtained and was without etiology. Symptoms now resolved.   DVT prophylaxis: Heparin subq Code Status:   Code Status: Full Code Family Communication: None at bedside Disposition Plan: Discharge pending improvement of functional ability to go home [facility]). Discussed with patient he decline to go to lives with family in [place]. He has live at hotel for long period of time. Discussed with PT, could aim for discharge for Monday, if he gets handicap room and first floor. CM will try to arrange [facility] PT.    Consultants:  Psychiatry Infectious disease  Procedures:    Antimicrobials:     Subjective: No new complaints. He was walking in the hall with walker with assistance of PT   Objective: BP (!) 161/84 (BP Location: Right Arm)   Pulse 74   Temp 98.3 F (36.8 C)   Resp 16   Ht '5\' 8"'$  (1.727 m)   Wt 76.5 kg   SpO2 98%   BMI 25.64 kg/m   Examination:  General: NAD   Data Reviewed: I have personally reviewed following labs and imaging studies  CBC Lab Results  Component Value Date   WBC 3.3 (L) 10/16/2022   RBC 2.65 (L) 10/16/2022   HGB 7.8 (L) 10/16/2022   HCT 23.5 (L) 10/16/2022   MCV 88.7 10/16/2022   MCH 29.4 10/16/2022   PLT 102 (L) 10/16/2022   MCHC 33.2 10/16/2022   RDW 14.2 10/16/2022   LYMPHSABS 0.9 09/05/2022   MONOABS 0.5 09/05/2022  EOSABS 0.1 09/05/2022   BASOSABS 0.0 19/41/7408     Last metabolic panel Lab Results  Component Value Date   NA 139 10/16/2022   K 3.9 10/16/2022   CL 109 10/16/2022   CO2 23 10/16/2022   BUN 28 (H) 10/16/2022   CREATININE 3.13 (H) 10/16/2022    GLUCOSE 106 (H) 10/16/2022   GFRNONAA 20 (L) 10/16/2022   GFRAA 33 (L) 12/12/2019   CALCIUM 8.4 (L) 10/16/2022   PHOS 2.8 09/18/2022   PROT 5.9 (L) 10/13/2022   ALBUMIN 3.3 (L) 10/13/2022   LABGLOB 3.2 12/26/2016   AGRATIO 1.2 12/26/2016   BILITOT 0.6 10/13/2022   ALKPHOS 60 10/13/2022   AST 38 10/13/2022   ALT 50 (H) 10/13/2022   ANIONGAP 7 10/16/2022    GFR: Estimated Creatinine Clearance: 20.6 mL/min (A) (by C-G formula based on SCr of 3.13 mg/dL (H)).  No results found for this or any previous visit (from the past 240 hour(s)).    Radiology Studies: No results found.    LOS: 7 days    Elmarie Shiley, MD [facility] 10/17/2022, 2:03 PM   If 7PM-7AM, please contact night-coverage [email or web address]

## 2022-10-17 NOTE — Progress Notes (Signed)
Occupational Therapy Treatment Patient Details Name: Michael [name] MRN: [id number] DOB: 1950/11/17 Today's Date: 10/17/2022   History of present illness 72 y.o. male admitted 09/09/22 with chest pain, AKI on CKD stage IIIb, Suicidal ideations in the setting of depression. Recently hospitalized 11/30-12/1/23. PMHx:HTN, chronic HFrEF with recovered LVEF 60 to 65%, PAF on Eliquis, CAD s/p CABG and stenting, DM, CKD stage IIIb, HIV on HAART, and cocaine abuse.   OT comments  Patient continues to progress with OT treatment with supervision for shower and shower transfers for safety and able to dress self with setup only. Patient performed mobility and transfers in in room with straight cane due to limited space for rollator.  Patient states that he would like to be able to perform mobility with cane only but is unsafe to perform without min guard due to possible LOB. Patient to continue to be followed by acute OT with [facility] program to progress to Mod I for all self care and functional transfers.    Recommendations for follow up therapy are one component of a multi-disciplinary discharge planning process, led by the attending physician.  Recommendations may be updated based on patient status, additional functional criteria and insurance authorization.    Follow Up Recommendations  Other (comment) (drug rehab)     Assistance Recommended at Discharge Intermittent Supervision/Assistance  Patient can return home with the following  Assist for transportation;A little help with walking and/or transfers;A little help with bathing/dressing/bathroom   Equipment Recommendations  None recommended by OT    Recommendations for Other Services      Precautions / Restrictions Precautions Precautions: Fall Restrictions Weight Bearing Restrictions: No       Mobility Bed Mobility Overal bed mobility: Modified Independent Bed Mobility: Sit to Supine     Supine to sit: Modified independent  (Device/Increase time)          Transfers Overall transfer level: Needs assistance Equipment used: Straight cane Transfers: Sit to/from Stand Sit to Stand: Min guard           General transfer comment: min guard for safety and increased time     Balance Overall balance assessment: Needs assistance Sitting-balance support: No upper extremity supported, Feet supported Sitting balance-Leahy Scale: Good     Standing balance support: No upper extremity supported, During functional activity Standing balance-Leahy Scale: Fair Standing balance comment: able to stand for self care tasks without UE support, reliant on at least one extremity support for dynamic standing                           ADL either performed or assessed with clinical judgement   ADL Overall ADL's : Needs assistance/impaired     Grooming: Wash/dry hands;Wash/dry face;Oral care;Supervision/safety;Standing Grooming Details (indicate cue type and reason): distant supervision Upper Body Bathing: Supervision/ safety;Sitting Upper Body Bathing Details (indicate cue type and reason): in shower Lower Body Bathing: Sit to/from stand;Supervison/ safety Lower Body Bathing Details (indicate cue type and reason): in shower Upper Body Dressing : Set up Upper Body Dressing Details (indicate cue type and reason): distant supervision Lower Body Dressing: Set up Lower Body Dressing Details (indicate cue type and reason): distant supervision         Tub/ Shower Transfer: Supervision/safety;Walk-in shower Tub/Shower Transfer Details (indicate cue type and reason): supervision for safety   General ADL Comments: supervision for shower transfers and bathing in shower for safety.    Extremity/Trunk Assessment  Vision       Perception     Praxis      Cognition Arousal/Alertness: Awake/alert Behavior During Therapy: WFL for tasks assessed/performed Overall Cognitive Status: Within  Functional Limits for tasks assessed                                          Exercises      Shoulder Instructions       General Comments      Pertinent Vitals/ Pain       Pain Assessment Pain Assessment: Faces Faces Pain Scale: Hurts little more Pain Location: back after ambulation Pain Descriptors / Indicators: Discomfort, Grimacing Pain Intervention(s): Monitored during session, Repositioned, Heat applied  Home Living                                          Prior Functioning/Environment              Frequency  Min 5X/week        Progress Toward Goals  OT Goals(current goals can now be found in the care plan section)  Progress towards OT goals: Progressing toward goals  Acute Rehab OT Goals Patient Stated Goal: walk with cane OT Goal Formulation: With patient Time For Goal Achievement: 10/29/22 Potential to Achieve Goals: Good ADL Goals Pt Will Perform Grooming: with supervision;standing Pt Will Perform Lower Body Bathing: with modified independence;sit to/from stand Pt Will Perform Lower Body Dressing: with modified independence;sit to/from stand Pt Will Transfer to Toilet: with modified independence;ambulating;regular height toilet Pt Will Perform Tub/Shower Transfer: Shower transfer;with modified independence Pt/caregiver will Perform Home Exercise Program: Increased strength;Both right and left upper extremity;With written HEP provided;Independently Additional ADL Goal #1: Pt will independently verbalize 3 strategies to reduce risk of falls  Plan Discharge plan remains appropriate;Frequency remains appropriate    Co-evaluation                 AM-PAC OT "6 Clicks" Daily Activity     Outcome Measure   Help from another person eating meals?: None Help from another person taking care of personal grooming?: A Little Help from another person toileting, which includes using toliet, bedpan, or urinal?: A  Little Help from another person bathing (including washing, rinsing, drying)?: A Little Help from another person to put on and taking off regular upper body clothing?: A Little Help from another person to put on and taking off regular lower body clothing?: A Little 6 Click Score: 19    End of Session Equipment Utilized During Treatment: Other (comment) (straight cane)  OT Visit Diagnosis: Unsteadiness on feet (R26.81);Muscle weakness (generalized) (M62.81)   Activity Tolerance Patient tolerated treatment well   Patient Left in chair;with call bell/phone within reach;with chair alarm set   Nurse Communication Mobility status        Time: 9622-2979 OT Time Calculation (min): 47 min  Charges: OT General Charges $OT Visit: 1 Visit OT Treatments $Self Care/Home Management : 23-37 mins $Therapeutic Activity: 8-22 mins  Lodema Hong, [place]  Office [phone]   Trixie Dredge 10/17/2022, 9:21 AM

## 2022-10-17 NOTE — Progress Notes (Signed)
Physical Therapy Treatment Patient Details Name: Michael [name] MRN: [id number] DOB: 1951/09/03 Today's Date: 10/17/2022   History of Present Illness 72 y.o. male admitted 09/09/22 with chest pain, AKI on CKD stage IIIb, Suicidal ideations in the setting of depression. Recently hospitalized 11/30-12/1/23. PMHx:HTN, chronic HFrEF with recovered LVEF 60 to 65%, PAF on Eliquis, CAD s/p CABG and stenting, DM, CKD stage IIIb, HIV on HAART, and cocaine abuse.    PT Comments    [facility] PT Session: Pt making good progress towards his goals however continues to be limited in safe mobility by dizziness and nausea likely due to L eye visual disturbance present after his stroke. Pt is progressing his ambulation with single point cane and use of occulusion glasses. Pt practicing gaze stabilization techniques to decrease effects of visual change. Pt is not currently ready for discharge, however likely will progress to safe mobilization next week. In order to be successful pt will need the following due to his visual and mobility deficits.  Reservations at a hotel for a first floor, handicap accessible room Transportation to above hotel Clothing to include, multiple pairs of socks & underware, long sleeve shirt, sweat pants, winter jacket Upright Rollator [place] Referral to neural ophthalmologist for appropriate glasses   PT will continue to work on progressing pt mobility.    Recommendations for follow up therapy are one component of a multi-disciplinary discharge planning process, led by the attending physician.  Recommendations may be updated based on patient status, additional functional criteria and insurance authorization.  Follow Up Recommendations  Other (comment) (pt needs first floor, handicap accessible hotel room) Can patient physically be transported by private vehicle: Yes   Assistance Recommended at Discharge Set up Supervision/Assistance  Patient can return home with  the following A little help with walking and/or transfers;A little help with bathing/dressing/bathroom;Direct supervision/assist for medications management;Direct supervision/assist for financial management;Assist for transportation;Assistance with cooking/housework   Equipment Recommendations  Other (comment);Cane Paediatric nurse)    Recommendations for Other Services Other (comment) Neural Opthalmology     Precautions / Restrictions Precautions Precautions: Fall Restrictions Weight Bearing Restrictions: No     Mobility  Bed Mobility Overal bed mobility: Modified Independent             General bed mobility comments: able to get in and out of a flattened bed with    Transfers Overall transfer level: Needs assistance Equipment used: Straight cane Transfers: Sit to/from Stand Sit to Stand: Min guard           General transfer comment: min guard for safety, good hand placement. uses back of LE to stabilize against    Ambulation/Gait Ambulation/Gait assistance: Supervision Gait Distance (Feet): 400 Feet (x2) Assistive device: Straight cane Gait Pattern/deviations: Step-through pattern, Decreased step length - right, Decreased step length - left, Trunk flexed, Narrow base of support Gait velocity: slowed Gait velocity interpretation: <1.31 ft/sec, indicative of household ambulator   General Gait Details: Worked with pt using upright Rollator. Pt is supervision for safety. When pt fatigues and starts to lean over, PT ask him to perform safe seated rest break in Rollator. Pt able to utilize wall for support and safely turn around and sit down to rest. Once rested pt able to get up, safely turn around and maneuver Rollator away from wall. With walking back to room pt noted to self correct narrow BoS.          Balance Overall balance assessment: Needs assistance Sitting-balance support: No upper  extremity supported, Feet supported Sitting balance-Leahy Scale:  Good     Standing balance support: No upper extremity supported, During functional activity Standing balance-Leahy Scale: Fair Standing balance comment: continues to need at least single UE support for dynamic balance                            Cognition Arousal/Alertness: Awake/alert Behavior During Therapy: WFL for tasks assessed/performed Overall Cognitive Status: Within Functional Limits for tasks assessed                                             General Comments General comments (skin integrity, edema, etc.): Ambulated with pt utilizing glassess with L lens completely occluded, and practices gaze stabilization,  reports he has less dizziness and nausea than previous bouts of ambulation. Also improves his gait sequencing      Pertinent Vitals/Pain Pain Assessment Pain Assessment: Faces Faces Pain Scale: Hurts little more Pain Location: back after ambulation Pain Descriptors / Indicators: Discomfort, Grimacing Pain Intervention(s): Monitored during session, Repositioned, Heat applied     PT Goals (current goals can now be found in the care plan section) Acute Rehab PT Goals PT Goal Formulation: With patient Time For Goal Achievement: 10/29/22 Potential to Achieve Goals: Good Progress towards PT goals: Progressing toward goals    Frequency    Min 3X/week      PT Plan Current plan remains appropriate       AM-PAC PT "6 Clicks" Mobility   Outcome Measure  Help needed turning from your back to your side while in a flat bed without using bedrails?: None Help needed moving from lying on your back to sitting on the side of a flat bed without using bedrails?: A Little Help needed moving to and from a bed to a chair (including a wheelchair)?: A Little Help needed standing up from a chair using your arms (e.g., wheelchair or bedside chair)?: A Little Help needed to walk in hospital room?: A Little Help needed climbing 3-5 steps with a  railing? : A Lot 6 Click Score: 18    End of Session Equipment Utilized During Treatment: Gait belt Activity Tolerance: Patient tolerated treatment well Patient left: with call bell/phone within reach;in bed Nurse Communication: Mobility status PT Visit Diagnosis: Unsteadiness on feet (R26.81);Other abnormalities of gait and mobility (R26.89);History of falling (Z91.81);Muscle weakness (generalized) (M62.81);Difficulty in walking, not elsewhere classified (R26.2)     Time: 8299-3716 PT Time Calculation (min) (ACUTE ONLY): 44 min  Charges:  $Gait Training: 23-37 mins $Therapeutic Activity: 8-22 mins                     Hayven Croy B. Migdalia Dk PT, DPT Acute Rehabilitation Services Please use secure chat or  Call Office [phone]    [place] 10/17/2022, 3:14 PM

## 2022-10-18 ENCOUNTER — Other Ambulatory Visit (HOSPITAL_COMMUNITY): Payer: Self-pay

## 2022-10-18 DIAGNOSIS — N178 Other acute kidney failure: Secondary | ICD-10-CM | POA: Diagnosis not present

## 2022-10-18 LAB — CBC
HCT: 24.4 % — ABNORMAL LOW (ref 39.0–52.0)
Hemoglobin: 7.8 g/dL — ABNORMAL LOW (ref 13.0–17.0)
MCH: 28.5 pg (ref 26.0–34.0)
MCHC: 32 g/dL (ref 30.0–36.0)
MCV: 89.1 fL (ref 80.0–100.0)
Platelets: 97 10*3/uL — ABNORMAL LOW (ref 150–400)
RBC: 2.74 MIL/uL — ABNORMAL LOW (ref 4.22–5.81)
RDW: 14.2 % (ref 11.5–15.5)
WBC: 3.6 10*3/uL — ABNORMAL LOW (ref 4.0–10.5)
nRBC: 0 % (ref 0.0–0.2)

## 2022-10-18 LAB — BASIC METABOLIC PANEL
Anion gap: 6 (ref 5–15)
BUN: 29 mg/dL — ABNORMAL HIGH (ref 8–23)
CO2: 23 mmol/L (ref 22–32)
Calcium: 8.7 mg/dL — ABNORMAL LOW (ref 8.9–10.3)
Chloride: 111 mmol/L (ref 98–111)
Creatinine, Ser: 3.24 mg/dL — ABNORMAL HIGH (ref 0.61–1.24)
GFR, Estimated: 20 mL/min — ABNORMAL LOW (ref >60)
Glucose, Bld: 107 mg/dL — ABNORMAL HIGH (ref 70–99)
Potassium: 4 mmol/L (ref 3.5–5.1)
Sodium: 140 mmol/L (ref 135–145)

## 2022-10-18 MED ORDER — HYDRALAZINE HCL 50 MG PO TABS
50.0000 mg | ORAL_TABLET | Freq: Three times a day (TID) | ORAL | 0 refills | Status: DC
  Filled 2022-10-18: qty 90, 30d supply, fill #0

## 2022-10-18 MED ORDER — POLYETHYLENE GLYCOL 3350 17 GM/SCOOP PO POWD
17.0000 g | Freq: Two times a day (BID) | ORAL | 0 refills | Status: DC
  Filled 2022-10-18: qty 238, 7d supply, fill #0

## 2022-10-18 MED ORDER — RENA-VITE PO TABS
1.0000 | ORAL_TABLET | Freq: Every day | ORAL | 0 refills | Status: DC
  Filled 2022-10-18: qty 30, 30d supply, fill #0

## 2022-10-18 MED ORDER — SODIUM BICARBONATE 650 MG PO TABS
325.0000 mg | ORAL_TABLET | Freq: Two times a day (BID) | ORAL | 0 refills | Status: DC
  Filled 2022-10-18: qty 30, 30d supply, fill #0

## 2022-10-18 MED ORDER — FERROUS SULFATE 325 (65 FE) MG PO TABS
325.0000 mg | ORAL_TABLET | Freq: Every day | ORAL | 1 refills | Status: DC
  Filled 2022-10-18: qty 30, 30d supply, fill #0

## 2022-10-18 MED ORDER — MECLIZINE HCL 25 MG PO TABS
25.0000 mg | ORAL_TABLET | Freq: Three times a day (TID) | ORAL | 0 refills | Status: DC | PRN
  Filled 2022-10-18: qty 30, 10d supply, fill #0

## 2022-10-18 MED ORDER — THIAMINE HCL 100 MG PO TABS
ORAL_TABLET | Freq: Every day | ORAL | 1 refills | Status: DC
  Filled 2022-10-18: qty 30, 30d supply, fill #0

## 2022-10-18 NOTE — Progress Notes (Signed)
Physical Therapy Treatment Patient Details Name: Michael [name] MRN: [id number] DOB: Feb 16, 1951 Today's Date: 10/18/2022   History of Present Illness 72 y.o. male admitted 09/09/22 with chest pain, AKI on CKD stage IIIb, Suicidal ideations in the setting of depression. Recently hospitalized 11/30-12/1/23. PMHx:HTN, chronic HFrEF with recovered LVEF 60 to 65%, PAF on Eliquis, CAD s/p CABG and stenting, DM, CKD stage IIIb, HIV on HAART, and cocaine abuse.    PT Comments    [facility] PT Session: Pt sitting on bed with case manager in room, as pt plan to discharge to friends apartment today. PT brought eye patch for L eye in attempt to decrease dizziness with walking. Once fitted to eye able to ambulate in hallway with cane and only supervision. Worked with pt on stair training and able to ascend/descend stairs with min guard. Discussed use of gaze stabilization especially with stair training. PT able to provide pt with light coat and pair of sweat pants with pockets. Pt expresses gratitude but also states it is time for him to go. Despite fact pt would benefit from HHPT pt politely declines and promises to have PCP refer after he gets settled somewhere.     Recommendations for follow up therapy are one component of a multi-disciplinary discharge planning process, led by the attending physician.  Recommendations may be updated based on patient status, additional functional criteria and insurance authorization.  Follow Up Recommendations  Other (comment) (pt needs first floor, handicap accessible hotel room) Can patient physically be transported by private vehicle: Yes   Assistance Recommended at Discharge Set up Supervision/Assistance  Patient can return home with the following Direct supervision/assist for medications management;Direct supervision/assist for financial management;Assist for transportation;Assistance with cooking/housework   Equipment Recommendations  Other (comment);Cane (upright  Rollator)    Recommendations for Other Services Other (comment)     Precautions / Restrictions Precautions Precautions: Fall Restrictions Weight Bearing Restrictions: No     Mobility  Bed Mobility               General bed mobility comments: sitting EoB talking to    Transfers Overall transfer level: Needs assistance Equipment used: Straight cane Transfers: Sit to/from Stand Sit to Stand: Supervision           General transfer comment: supervision for safety    Ambulation/Gait Ambulation/Gait assistance: Supervision Gait Distance (Feet): 400 Feet Assistive device: Straight cane Gait Pattern/deviations: Step-through pattern, Decreased step length - right, Decreased step length - left, Trunk flexed, Narrow base of support Gait velocity: slowed Gait velocity interpretation: 1.31 - 2.62 ft/sec, indicative of limited community ambulator   General Gait Details: using cane in R hand, pt reports no dizziness with walking today, exhibits good sequencing and improved steadiness   Stairs Stairs: Yes Stairs assistance: Min guard Stair Management: One rail Left, Step to pattern, Forwards Number of Stairs: 15 General stair comments: min guard for safety with ascent/descent of 15 step with step to pattern, up with good down with bad, pt reports minor dizziness which is improved with gaze stabilization       Balance Overall balance assessment: Needs assistance Sitting-balance support: No upper extremity supported, Feet supported Sitting balance-Leahy Scale: Good     Standing balance support: No upper extremity supported, During functional activity Standing balance-Leahy Scale: Fair Standing balance comment: continues to need at least single UE support for dynamic balance  Cognition Arousal/Alertness: Awake/alert Behavior During Therapy: WFL for tasks assessed/performed Overall Cognitive Status: Within Functional Limits for  tasks assessed                                             General Comments General comments (skin integrity, edema, etc.): fitted pt with eye patch for L eye, pt with no reports of dizziness with ambulation and minor dizziness with stairs      Pertinent Vitals/Pain Pain Assessment Pain Assessment: Faces Faces Pain Scale: Hurts a little bit Pain Location: back after ambulation Pain Descriptors / Indicators: Discomfort, Grimacing Pain Intervention(s): Limited activity within patient's tolerance, Monitored during session, Repositioned     PT Goals (current goals can now be found in the care plan section) Acute Rehab PT Goals PT Goal Formulation: With patient Time For Goal Achievement: 10/29/22 Potential to Achieve Goals: Good Progress towards PT goals: Progressing toward goals    Frequency    Min 3X/week      PT Plan Current plan remains appropriate       AM-PAC PT "6 Clicks" Mobility   Outcome Measure  Help needed turning from your back to your side while in a flat bed without using bedrails?: None Help needed moving from lying on your back to sitting on the side of a flat bed without using bedrails?: None Help needed moving to and from a bed to a chair (including a wheelchair)?: None Help needed standing up from a chair using your arms (e.g., wheelchair or bedside chair)?: None Help needed to walk in hospital room?: A Little Help needed climbing 3-5 steps with a railing? : A Little 6 Click Score: 22    End of Session Equipment Utilized During Treatment: Gait belt Activity Tolerance: Patient tolerated treatment well Patient left: with call bell/phone within reach (sitting EoB) Nurse Communication: Mobility status PT Visit Diagnosis: Unsteadiness on feet (R26.81);Other abnormalities of gait and mobility (R26.89);History of falling (Z91.81);Muscle weakness (generalized) (M62.81);Difficulty in walking, not elsewhere classified (R26.2)     Time:  1215-1300 PT Time Calculation (min) (ACUTE ONLY): 45 min  Charges:  $Gait Training: 8-22 mins $Therapeutic Activity: 8-22 mins $Self Care/Home Management: 8-22                     Madalynn Pickelsimer B. Migdalia Dk PT, DPT Acute Rehabilitation Services Please use secure chat or  Call Office [phone]    [place] 10/18/2022, 3:55 PM

## 2022-10-18 NOTE — Discharge Summary (Signed)
Physician Discharge Summary   Patient: Michael [name] MRN: [id number] DOB: 04/10/1951  Admit date:     09/09/2022  Discharge date: 10/18/22  Discharge Physician: Elmarie Shiley   PCP: [name], MD   Recommendations at discharge:    Ambulatory referral to Ophthalmology and Nephrology ordered.  Needs follow up for renal function.   Discharge Diagnoses: Active Problems:   Acute kidney injury superimposed on chronic kidney disease ([place])   Essential hypertension   Paroxysmal A-fib ([name])   (HFpEF) heart failure with preserved ejection fraction ([name])   HIV (human immunodeficiency virus infection) ([place])   History of pulmonary embolism   Type 2 diabetes mellitus with hyperlipidemia ([name])   Polysubstance abuse ([place])   Suicidal ideations   COPD (chronic obstructive pulmonary disease) ([name])   Back pain   CKD (chronic kidney disease), stage IV ([name])   Hyperkalemia  Resolved Problems:   Abdominal pain  Hospital Course: Michael [name] is a 72 y.o. male with a history of hypertension, chronic heart failure, paroxysmal atrial fibrillation, CAD s/p CABG/PCI, CKD, HIV, cocaine abuse. [name]. Admission complicated by significant AKI that responded to IV fluids. Discharge complicated by inability to place patient at a SNF.   Assessment and Plan: Acute kidney injury superimposed on chronic kidney disease ([place]) -Creatinine of 6.32 on admission.  Patient initially started on IV fluids with improvement of renal function towards baseline. Creatinine improved on IV fluids. -Cr stable 3.1---3.2 Needs follow up with nephrology./     Essential hypertension -Continue diltiazem, hydralazine, and Imdur   Paroxysmal A-fib ([name]) Patient is not on anticoagulation secondary to history of high bleeding risk -Continue diltiazem   (HFpEF) heart failure with preserved ejection fraction ([name]) Stable. Not on diuretic  therapy.   HIV (human immunodeficiency virus infection) ([place]) -Continue Juluca   History of pulmonary embolism Not on anticoagulation.   Type 2 diabetes mellitus with hyperlipidemia ([name]) Hemoglobin A1C of 7.7%.  He has not required insulin   Polysubstance abuse ([place]) History of cocaine use.   Suicidal ideations Evaluated by psychiatry. No role for inpatient admission. -Continue trazodone and fluoxetine   COPD (chronic obstructive pulmonary disease) ([name]) -Continue Incruse Ellipta, Dulera and Duoneb PRN   Hyperkalemia Likely secondary to kidney disease. Mild. Improved.   CKD (chronic kidney disease), stage IV ([name]) Baseline serum creatinine appears to be around 2.2-2.4. Patient will need nephrology follow-up as an outpatient. Ambulatory referral order in place.    Back pain Patient appears to have a history of lumbar spinal stenosis. He states a history of back surgery but is unable to tell me what was performed. Lumbar x-ray without acute fracture. Pain improved with Norco.    Abdominal pain-resolved as of 10/14/2022 None specific. No constipation or diarrhea. No nausea/vomiting. Having bowel movements. Normal bowel sounds.  Generalized tenderness. Abdominal x-ray unremarkable. Not relieved with bowel movements. CT abdomen obtained and was without etiology. Symptoms now resolved.  Disposition Patient will be discharge to a friend house. He has been clear by PT>  He decline HH PT>       Consultants: None Procedures performed: None Disposition: Home Diet recommendation:  Discharge Diet Orders (From admission, onward)     Start     Ordered   10/18/22 0000  Diet - low sodium heart healthy        10/18/22 1325   10/18/22 0000  Diet Carb Modified  10/18/22 1325           Cardiac diet DISCHARGE MEDICATION: Allergies as of 10/18/2022   No Known Allergies      Medication List     STOP taking these medications    Lantus SoloStar 100 UNIT/ML Solostar  Pen Generic drug: insulin glargine   sevelamer carbonate 800 MG tablet Commonly known as: RENVELA       TAKE these medications    Accu-Chek Guide test strip Generic drug: glucose blood use as directed up to four times daily   Accu-Chek Guide w/Device Kit Use as directed.   Accu-Chek Softclix Lancets lancets Use as directed up to four times daily   albuterol 108 (90 Base) MCG/ACT inhaler Commonly known as: VENTOLIN HFA Inhale 1-2 puffs into the lungs every 6 (six) hours as needed for wheezing or shortness of breath.   Aspirin Low Dose 81 MG chewable tablet Generic drug: aspirin Chew 1 tablet (81 mg total) by mouth daily.   atorvastatin 40 MG tablet Commonly known as: LIPITOR Take 1 tablet (40 mg total) by mouth daily.   Cartia XT 120 MG 24 hr capsule Generic drug: diltiazem Take 1 capsule (120 mg total) by mouth daily.   clopidogrel 75 MG tablet Commonly known as: PLAVIX Take 1 tablet (75 mg total) by mouth daily.   cyanocobalamin 100 MCG tablet Take 1 tablet (100 mcg total) by mouth daily. Start taking on: October 19, 2022   dolutegravir-rilpivirine 50-25 MG tablet Commonly known as: JULUCA Take 1 tablet by mouth daily before lunch.   ferrous sulfate 325 (65 FE) MG tablet Take 1 tablet (325 mg total) by mouth daily with breakfast. Start taking on: October 19, 2022   FLUoxetine 20 MG capsule Commonly known as: PROZAC Take 1 capsule (20 mg total) by mouth daily.   gabapentin 300 MG capsule Commonly known as: NEURONTIN Take 1 capsule (300 mg total) by mouth at bedtime.   hydrALAZINE 50 MG tablet Commonly known as: APRESOLINE Take 1 tablet (50 mg total) by mouth every 8 (eight) hours.   Incruse Ellipta 62.5 MCG/ACT Aepb Generic drug: umeclidinium bromide Inhale 1 puff into the lungs daily.   isosorbide mononitrate 30 MG 24 hr tablet Commonly known as: IMDUR Take 0.5 tablets (15 mg total) by mouth daily.   meclizine 25 MG tablet Commonly known as:  ANTIVERT Take 1 tablet (25 mg total) by mouth 3 (three) times daily as needed for dizziness.   multivitamin Tabs tablet Take 1 tablet by mouth at bedtime.   nitroGLYCERIN 0.4 MG SL tablet Commonly known as: NITROSTAT Place 1 tablet (0.4 mg total) under the tongue every 5 (five) minutes as needed for chest pain.   polyethylene glycol 17 g packet Commonly known as: MIRALAX / GLYCOLAX Take 17 g by mouth 2 (two) times daily.   sodium bicarbonate 325 MG tablet Take 1 tablet (325 mg total) by mouth 2 (two) times daily.   Symbicort 160-4.5 MCG/ACT inhaler Generic drug: budesonide-formoterol Inhale 2 puffs into the lungs in the morning and at bedtime.   Unifine Pentips 31G X 5 MM Misc Generic drug: Insulin Pen Needle Use with insulin pens What changed:  how much to take how to take this when to take this   BD Pen Needle Nano U/F 32G X 4 MM Misc Generic drug: Insulin Pen Needle Use as directed up to 4 times daily. What changed: Another medication with the same name was changed. Make sure you understand how and when to take  each.               Durable Medical Equipment  (From admission, onward)           Start     Ordered   10/18/22 1307  For home use only DME Cane  Once        10/18/22 1308            Discharge Exam: Filed Weights   09/26/22 0351 09/28/22 2124 10/07/22 2056  Weight: 71.1 kg 76 kg 76.5 kg   General; NAD  Condition at discharge: stable  The results of significant diagnostics from this hospitalization (including imaging, microbiology, ancillary and laboratory) are listed below for reference.   Imaging Studies: CT ABDOMEN PELVIS WO CONTRAST  Result Date: 10/13/2022 CLINICAL DATA:  Abdominal pain, acute, nonlocalized EXAM: CT ABDOMEN AND PELVIS WITHOUT CONTRAST TECHNIQUE: Multidetector CT imaging of the abdomen and pelvis was performed following the standard protocol without IV contrast. RADIATION DOSE REDUCTION: This exam was performed  according to the departmental dose-optimization program which includes automated exposure control, adjustment of the mA and/or kV according to patient size and/or use of iterative reconstruction technique. COMPARISON:  11/11/2021 FINDINGS: Lower chest: Extensive right coronary artery calcification. Hypoattenuation of the cardiac blood pool is in keeping with at least moderate anemia. Cardiac size within normal limits. Visualized lung bases are clear. Hepatobiliary: No focal liver abnormality is seen. No gallstones, gallbladder wall thickening, or biliary dilatation. Pancreas: Unremarkable Spleen: Unremarkable Adrenals/Urinary Tract: Adrenal glands are unremarkable. Kidneys are normal, without renal calculi, focal lesion, or hydronephrosis. Bladder is unremarkable. Stomach/Bowel: The stomach, small bowel, and large bowel are unremarkable. No evidence of obstruction or focal inflammation. The appendix is not clearly identified and may be absent. No free intraperitoneal gas or fluid. Vascular/Lymphatic: Aortic atherosclerosis. No enlarged abdominal or pelvic lymph nodes. Reproductive: Surgical changes of trans urethral section are noted within the central prostate gland. The prostate gland is not enlarged. Other: Tiny fat containing umbilical hernia. Musculoskeletal: Posterior lumbar fusion L5-S1 noted with cerclage wires and spinous process clamps noted. Degenerative changes noted throughout the remainder of the thoracolumbar spine. No acute bone abnormality. No lytic or blastic bone lesion. IMPRESSION: 1. No acute intra-abdominal pathology identified. No definite radiographic explanation for the patient's reported symptoms. 2. Extensive right coronary artery calcification. 3. Hypoattenuation of the cardiac blood pool in keeping with at least moderate anemia. 4. Aortic atherosclerosis. Aortic Atherosclerosis (ICD10-I70.0). Electronically Signed   By: Fidela Salisbury M.D.   On: 10/13/2022 20:48   DG Abd Portable  1V  Result Date: 10/12/2022 CLINICAL DATA:  Abdominal tenderness EXAM: PORTABLE ABDOMEN - 1 VIEW COMPARISON:  None Available. FINDINGS: Lung bases are normal. No free air, portal venous gas, or pneumatosis. There is a paucity of bowel gas limiting evaluation but no evidence of obstruction or abnormal fecal loading. Phleboliths are identified in the pelvis. No other abnormalities. IMPRESSION: No cause for the patient's abdominal pain identified. Electronically Signed   By: Dorise Bullion III M.D.   On: 10/12/2022 15:56   DG Lumbar Spine 2-3 Views  Result Date: 09/26/2022 CLINICAL DATA:  Back pain with history of lumbar surgery EXAM: LUMBAR SPINE - 3 VIEW COMPARISON:  Abdominal radiograph dated 06/16/2022, CT abdomen and pelvis dated 09/02/2018 FINDINGS: Postsurgical changes from sacral spinous fixation. Hardware appears intact. There is no evidence of lumbar spine fracture. Unchanged grade 1 anterolisthesis of L4-5. Intervertebral disc spaces are maintained. IMPRESSION: Postsurgical changes from sacral spinous fixation. No evidence of  lumbar spine fracture. Electronically Signed   By: Darrin Nipper M.D.   On: 09/26/2022 18:09   DG CHEST PORT 1 VIEW  Result Date: 09/23/2022 CLINICAL DATA:  Chest tightness. EXAM: PORTABLE CHEST 1 VIEW COMPARISON:  09/09/2022 FINDINGS: Lungs are adequately inflated and otherwise clear. Cardiomediastinal silhouette and remainder of the exam is unchanged. IMPRESSION: No active disease. Electronically Signed   By: Marin Olp M.D.   On: 09/23/2022 10:14    Microbiology: Results for orders placed or performed during the hospital encounter of 08/10/22  Resp Panel by RT-PCR (Flu A&B, Covid)     Status: None   Collection Time: 08/10/22  8:43 AM   Specimen: Nasal Swab  Result Value Ref Range Status   SARS Coronavirus 2 by RT PCR NEGATIVE NEGATIVE Final    Comment: (NOTE) SARS-CoV-2 target nucleic acids are NOT DETECTED.  The SARS-CoV-2 RNA is generally detectable in upper  respiratory specimens during the acute phase of infection. The lowest concentration of SARS-CoV-2 viral copies this assay can detect is 138 copies/mL. A negative result does not preclude SARS-Cov-2 infection and should not be used as the sole basis for treatment or other patient management decisions. A negative result may occur with  improper specimen collection/handling, submission of specimen other than nasopharyngeal swab, presence of viral mutation(s) within the areas targeted by this assay, and inadequate number of viral copies(<138 copies/mL). A negative result must be combined with clinical observations, patient history, and epidemiological information. The expected result is Negative.  Fact Sheet for Patients:  EntrepreneurPulse.com.au  Fact Sheet for Healthcare Providers:  IncredibleEmployment.be  This test is no t yet approved or cleared by the [place] FDA and  has been authorized for detection and/or diagnosis of SARS-CoV-2 by FDA under an Emergency Use Authorization (EUA). This EUA will remain  in effect (meaning this test can be used) for the duration of the COVID-19 declaration under Section 564(b)(1) of the Act, 21 U.S.C.section 360bbb-3(b)(1), unless the authorization is terminated  or revoked sooner.       Influenza A by PCR NEGATIVE NEGATIVE Final   Influenza B by PCR NEGATIVE NEGATIVE Final    Comment: (NOTE) The Xpert Xpress SARS-CoV-2/FLU/RSV plus assay is intended as an aid in the diagnosis of influenza from Nasopharyngeal swab specimens and should not be used as a sole basis for treatment. Nasal washings and aspirates are unacceptable for Xpert Xpress SARS-CoV-2/FLU/RSV testing.  Fact Sheet for Patients: EntrepreneurPulse.com.au  Fact Sheet for Healthcare Providers: IncredibleEmployment.be  This test is not yet approved or cleared by the [place] FDA and has been  authorized for detection and/or diagnosis of SARS-CoV-2 by FDA under an Emergency Use Authorization (EUA). This EUA will remain in effect (meaning this test can be used) for the duration of the COVID-19 declaration under Section 564(b)(1) of the Act, 21 U.S.C. section 360bbb-3(b)(1), unless the authorization is terminated or revoked.  Performed at [facility], [place] [address], [place], [place] [address]   SARS Coronavirus 2 by RT PCR (hospital order, performed in [facility] hospital lab) *cepheid single result test* Anterior Nasal Swab     Status: None   Collection Time: 08/14/22  5:10 AM   Specimen: Anterior Nasal Swab  Result Value Ref Range Status   SARS Coronavirus 2 by RT PCR NEGATIVE NEGATIVE Final    Comment: (NOTE) SARS-CoV-2 target nucleic acids are NOT DETECTED.  The SARS-CoV-2 RNA is generally detectable in upper and lower respiratory specimens during the acute phase of  infection. The lowest concentration of SARS-CoV-2 viral copies this assay can detect is 250 copies / mL. A negative result does not preclude SARS-CoV-2 infection and should not be used as the sole basis for treatment or other patient management decisions.  A negative result may occur with improper specimen collection / handling, submission of specimen other than nasopharyngeal swab, presence of viral mutation(s) within the areas targeted by this assay, and inadequate number of viral copies (<250 copies / mL). A negative result must be combined with clinical observations, patient history, and epidemiological information.  Fact Sheet for Patients:   https://www.patel.info/  Fact Sheet for Healthcare Providers: https://hall.com/  This test is not yet approved or  cleared by the [place] FDA and has been authorized for detection and/or diagnosis of SARS-CoV-2 by FDA under an Emergency Use Authorization (EUA).  This EUA will remain in  effect (meaning this test can be used) for the duration of the COVID-19 declaration under Section 564(b)(1) of the Act, 21 U.S.C. section 360bbb-3(b)(1), unless the authorization is terminated or revoked sooner.  Performed at [facility], [place] [address] [place], [place] [address]     Labs: CBC: Recent Labs  Lab 10/13/22 1050 10/16/22 0320 10/18/22 0824  WBC 3.8* 3.3* 3.6*  HGB 7.9* 7.8* 7.8*  HCT 23.1* 23.5* 24.4*  MCV 85.6 88.7 89.1  PLT 103* 102* 97*   Basic Metabolic Panel: Recent Labs  Lab 10/12/22 1057 10/13/22 1050 10/14/22 0334 10/16/22 0320 10/18/22 0824  NA 139 139 138 139 140  K 3.8 3.9 4.3 3.9 4.0  CL 104 98 103 109 111  CO2 '25 31 27 23 23  '$ GLUCOSE 99 108* 141* 106* 107*  BUN 35* 31* 29* 28* 29*  CREATININE 3.33* 3.29* 3.16* 3.13* 3.24*  CALCIUM 7.9* 8.1* 8.0* 8.4* 8.7*   Liver Function Tests: Recent Labs  Lab 10/13/22 1050  AST 38  ALT 50*  ALKPHOS 60  BILITOT 0.6  PROT 5.9*  ALBUMIN 3.3*   CBG: No results for input(s): "GLUCAP" in the last 168 hours.  Discharge time spent: greater than 30 minutes.  Signed: Elmarie Shiley, MD [facility] 10/18/2022

## 2022-10-18 NOTE — Progress Notes (Signed)
Mobility Specialist Progress Note:   10/18/22 1225  Mobility  Activity Ambulated with assistance in hallway  Level of Assistance Standby assist, set-up cues, supervision of patient - no hands on  Assistive Device Cane  Distance Ambulated (ft) 550 ft  Activity Response Tolerated well  Mobility Referral Yes  $Mobility charge 1 Mobility   Pt received at doorway, requesting another mobility session. Pt wanted to go to vending machine to get soda. Displayed increased stability this session, only requiring supervision for ambulation with cane. Pt left in chair with all needs met.   Nelta Numbers Mobility Specialist Please contact via SecureChat or  Rehab office at [phone]

## 2022-10-18 NOTE — Progress Notes (Signed)
DISCHARGE NOTE HOME [place] Pulse to be discharged Home per MD order. Discussed prescriptions and follow up appointments with the patient. Prescriptions given to patient; medication list explained in detail. Patient verbalized understanding.  Skin clean, dry and intact without evidence of skin break down, no evidence of skin tears noted. IV catheter discontinued intact. Site without signs and symptoms of complications. Dressing and pressure applied. Pt denies pain at the site currently. No complaints noted.  Patient free of lines, drains, and wounds.   An After Visit Summary (AVS) was printed and given to the patient. Patient escorted via wheelchair, and discharged home via private auto.  Keenan Bachelor, RN

## 2022-10-18 NOTE — Progress Notes (Signed)
Mobility Specialist Progress Note:   10/18/22 0935  Mobility  Activity Ambulated with assistance in hallway  Level of Assistance Contact guard assist, steadying assist  Assistive Device Cane  Distance Ambulated (ft) 550 ft  Activity Response Tolerated well  Mobility Referral Yes  $Mobility charge 1 Mobility   Pt eager for mobility session. Required minG assist for safety. Pt c/o 6/10 lower back pain, no nausea or dizziness. Left in chair with all needs met, chair alarm on.  Nelta Numbers Mobility Specialist Please contact via SecureChat or  Rehab office at [phone]

## 2022-10-18 NOTE — TOC Transition Note (Signed)
Transition of Care [facility]) - CM/SW Discharge Note   Patient Details  Name: Michael [name] MRN: [id number] Date of Birth: September 16, 1951  Transition of Care [facility]) CM/SW Contact:  Tom-Johnson, Renea Ee, RN Phone Number: 10/18/2022, 1:58 PM   Clinical Narrative:     Patient is scheduled for discharge today. Home health recommended, patient declined at this time. States he will be staying at a friend's house at discharge and when he gets his own place, he will notify his PCP to order home health. CM notified by MD that patient will call Hospital to notify if he wants home health ordered.  Cane ordered from [employer], Erasmo Downer to deliver at bedside. Hospital f/u scheduled with PCP, info on AVS.  CM read and explained rider waiver and release of liability form to patient, verbalized understanding and signed form placed in chart. Cab voucher given to RN.  No further TOC needs noted.       Final next level of care: [place] (Patient declined) Barriers to Discharge: Barriers Resolved   Patient Goals and CMS Choice CMS Medicare.gov Compare Post Acute Care list provided to:: Patient Choice offered to / list presented to : Patient  Discharge Placement                  [facility] facility by[facility]      Discharge Plan and Services Additional resources added to the After Visit Summary for   In-house Referral: Clinical Social Work   Post Acute Care Choice: [place]          DME Arranged: [name] DME Agency: [employer] Date DME Agency Contacted: 10/17/22 Time DME Agency Contacted: [phone] Representative spoke with at DME Agency: Erasmo Downer HH Arranged: PT, Refused [place]          Social Determinants of Health ([place]) Interventions [place]: Food Insecurity Present (09/10/2022)  Housing: Medium Risk (09/10/2022)  Transportation Needs: Unmet Transportation Needs (09/10/2022)  Utilities: At Risk (09/10/2022)   Alcohol Screen: Low Risk  (06/15/2019)  Depression (PHQ2-9): High Risk (11/12/2021)  Financial Resource Strain: Medium Risk (09/16/2018)  Physical Activity: Unknown (04/22/2019)  Social Connections: Socially Isolated (09/16/2018)  Stress: Stress Concern Present (09/16/2018)  Tobacco Use: Low Risk  (09/09/2022)     Readmission Risk Interventions    07/05/2022   10:08 AM  Readmission Risk Prevention Plan  Transportation Screening Complete  Medication Review ([place]) Complete  PCP or Specialist appointment within 3-5 days of discharge Complete  HRI or [place] Complete  SW Recovery Care/Counseling Consult Complete  [place] Not Applicable

## 2022-10-19 IMAGING — CR DG CHEST 2V
3 series · 3 of 3 positions shown · non-contrast
Comparison: 08/28/2021

CLINICAL DATA: Chest pain

EXAM:
CHEST - 2 VIEW

[w chest pa]
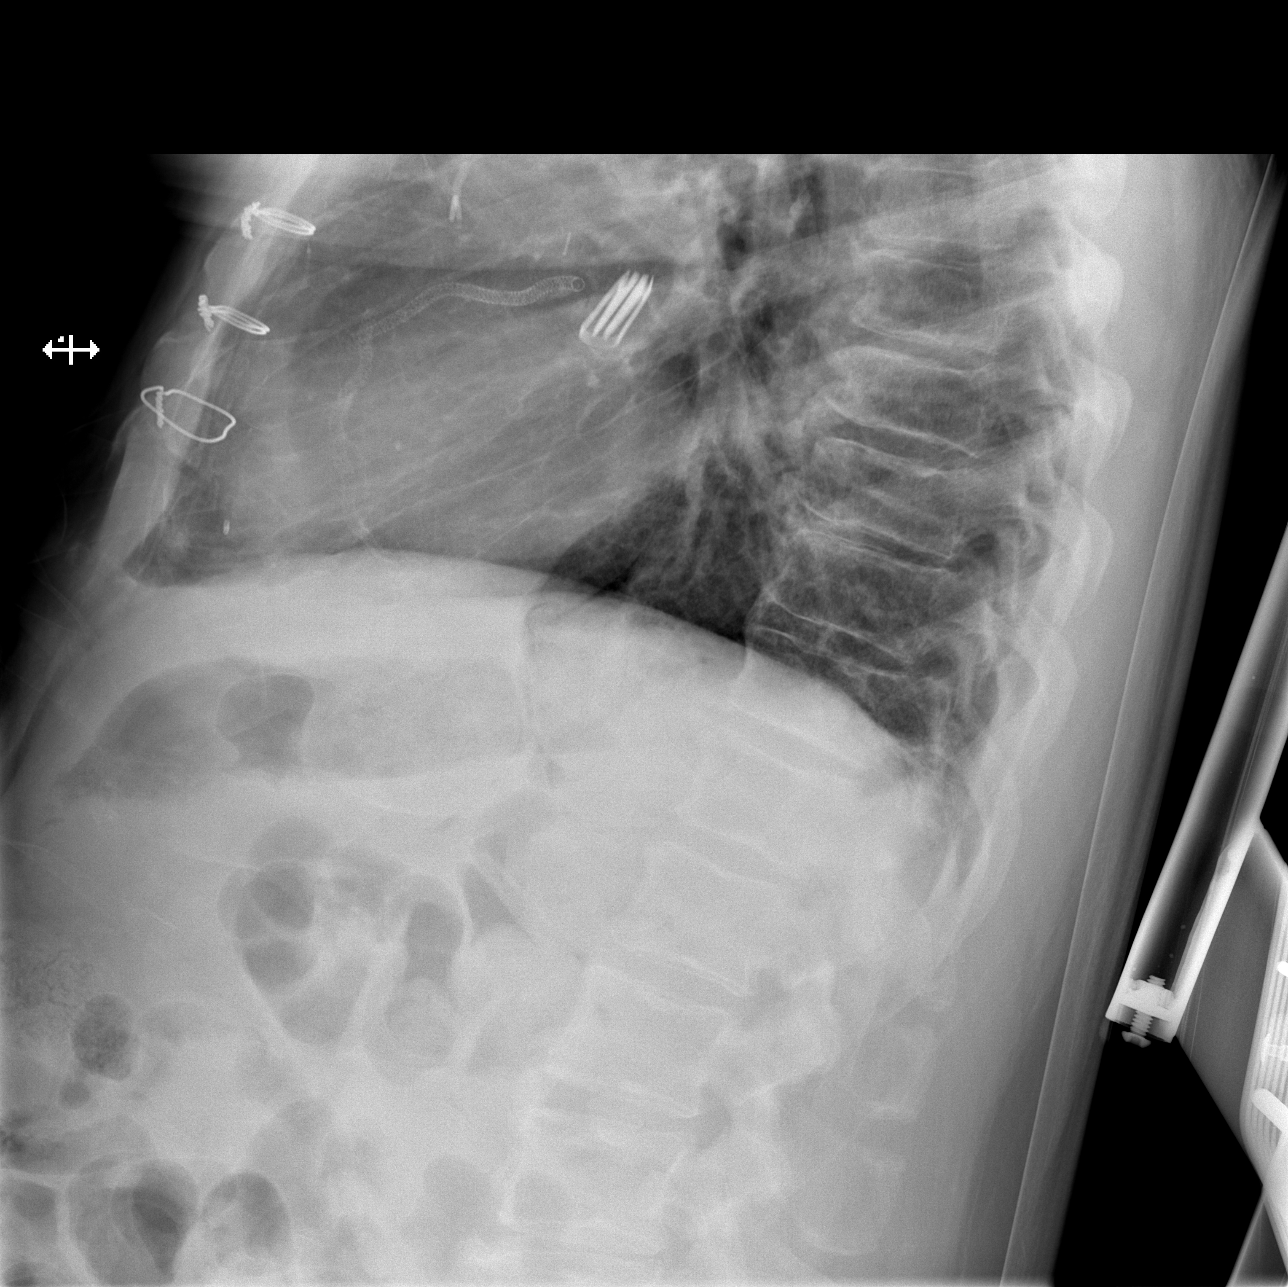

[w chest lat]
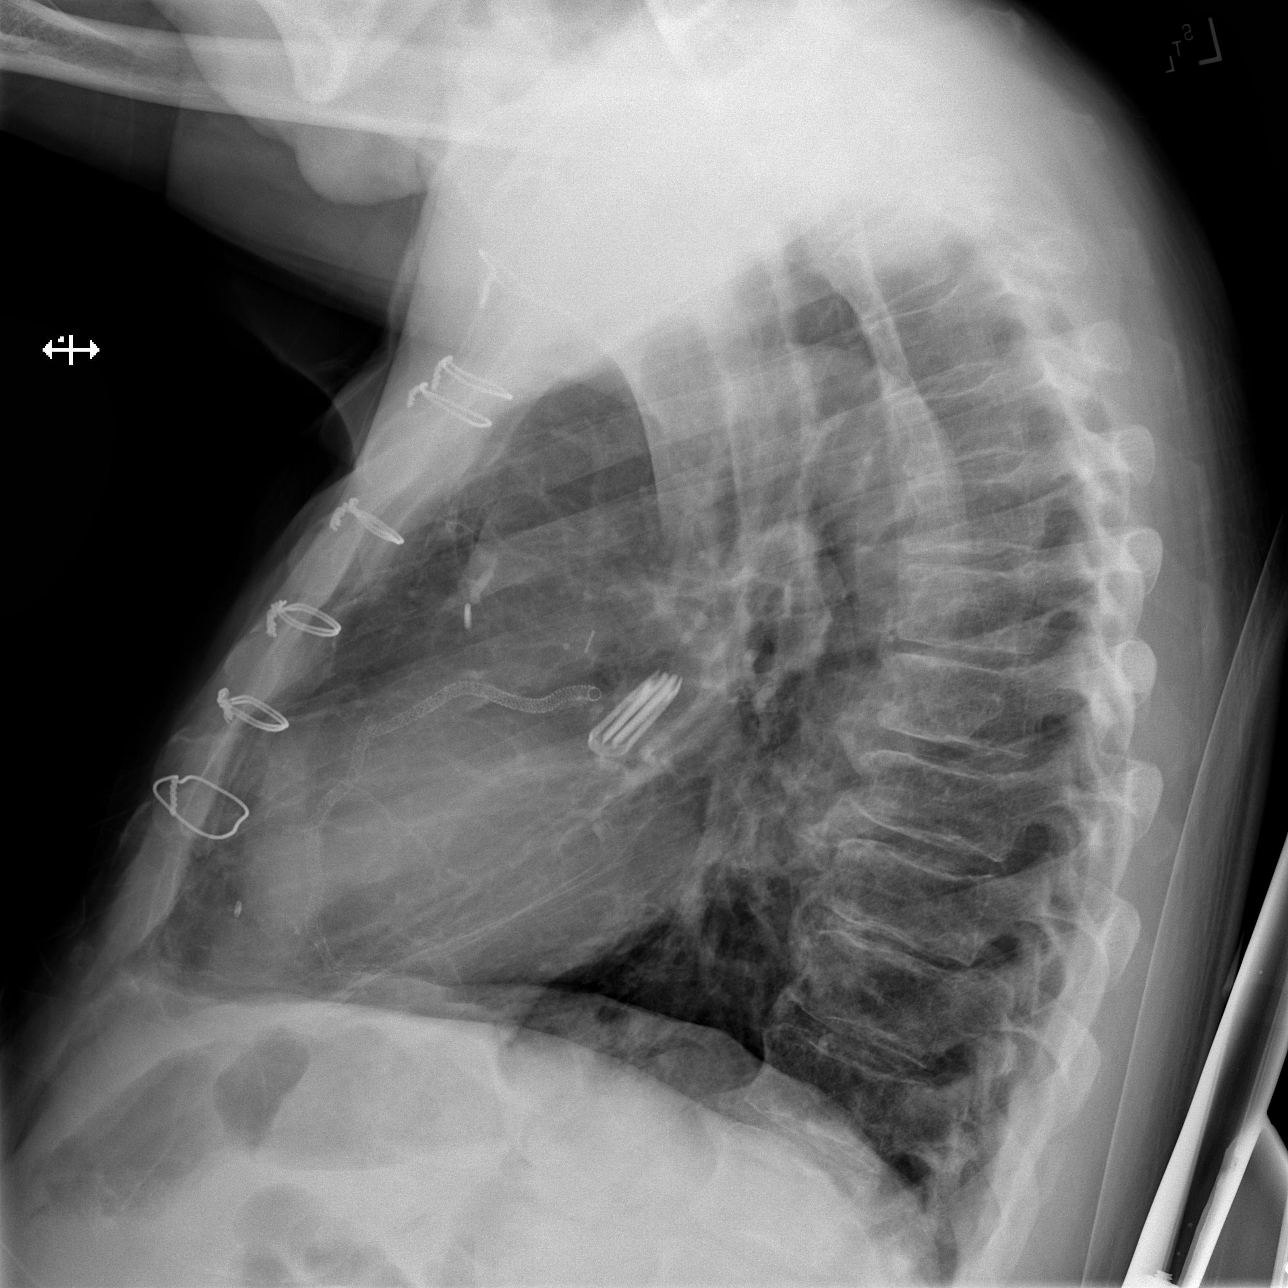

[x chest ap]
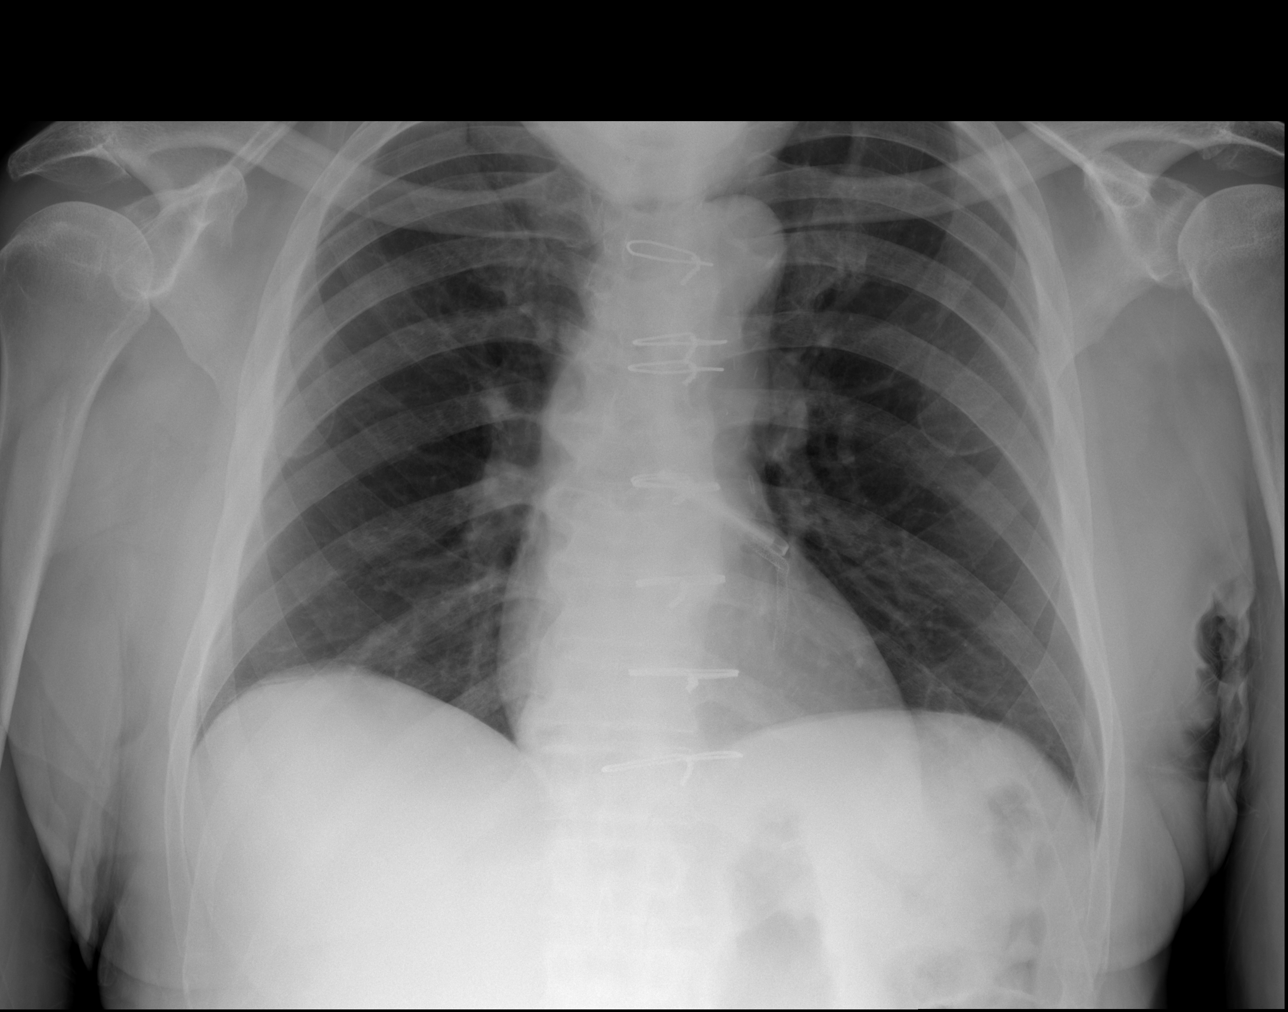

[3 of 3 positions shown; findings below may reference images not displayed]

FINDINGS: The heart size and mediastinal contours are within normal limits.
Both lungs are clear. The visualized skeletal structures are
unremarkable. Remote median sternotomy and left atrial appendage
clipping.
IMPRESSION: No active cardiopulmonary disease.

## 2022-10-21 ENCOUNTER — Emergency Department (HOSPITAL_COMMUNITY): Payer: 59

## 2022-10-21 ENCOUNTER — Encounter (HOSPITAL_COMMUNITY): Payer: Self-pay

## 2022-10-21 ENCOUNTER — Inpatient Hospital Stay (HOSPITAL_COMMUNITY)
Admission: EM | Admit: 2022-10-21 | Discharge: 2022-10-25 | DRG: 683 | Disposition: A | Discharge status: Home / Self Care | Payer: 59 | Attending: Internal Medicine | Admitting: Internal Medicine

## 2022-10-21 ENCOUNTER — Other Ambulatory Visit: Payer: Self-pay

## 2022-10-21 ENCOUNTER — Emergency Department (HOSPITAL_BASED_OUTPATIENT_CLINIC_OR_DEPARTMENT_OTHER): Payer: 59

## 2022-10-21 ENCOUNTER — Observation Stay (HOSPITAL_COMMUNITY): Payer: 59

## 2022-10-21 DIAGNOSIS — N179 Acute kidney failure, unspecified: Principal | ICD-10-CM | POA: Diagnosis present

## 2022-10-21 DIAGNOSIS — M79605 Pain in left leg: Secondary | ICD-10-CM

## 2022-10-21 DIAGNOSIS — M549 Dorsalgia, unspecified: Secondary | ICD-10-CM | POA: Diagnosis present

## 2022-10-21 DIAGNOSIS — Y92009 Unspecified place in unspecified non-institutional (private) residence as the place of occurrence of the external cause: Secondary | ICD-10-CM

## 2022-10-21 DIAGNOSIS — R52 Pain, unspecified: Secondary | ICD-10-CM | POA: Diagnosis not present

## 2022-10-21 DIAGNOSIS — F32A Depression, unspecified: Secondary | ICD-10-CM | POA: Diagnosis present

## 2022-10-21 DIAGNOSIS — N1831 Chronic kidney disease, stage 3a: Secondary | ICD-10-CM

## 2022-10-21 DIAGNOSIS — Z79899 Other long term (current) drug therapy: Secondary | ICD-10-CM

## 2022-10-21 DIAGNOSIS — Z21 Asymptomatic human immunodeficiency virus [HIV] infection status: Secondary | ICD-10-CM | POA: Diagnosis present

## 2022-10-21 DIAGNOSIS — E1122 Type 2 diabetes mellitus with diabetic chronic kidney disease: Secondary | ICD-10-CM | POA: Diagnosis present

## 2022-10-21 DIAGNOSIS — W1830XA Fall on same level, unspecified, initial encounter: Secondary | ICD-10-CM | POA: Diagnosis present

## 2022-10-21 DIAGNOSIS — N1832 Chronic kidney disease, stage 3b: Secondary | ICD-10-CM

## 2022-10-21 DIAGNOSIS — I5030 Unspecified diastolic (congestive) heart failure: Secondary | ICD-10-CM | POA: Diagnosis present

## 2022-10-21 DIAGNOSIS — I5032 Chronic diastolic (congestive) heart failure: Secondary | ICD-10-CM | POA: Diagnosis present

## 2022-10-21 DIAGNOSIS — J449 Chronic obstructive pulmonary disease, unspecified: Secondary | ICD-10-CM | POA: Diagnosis present

## 2022-10-21 DIAGNOSIS — Z83438 Family history of other disorder of lipoprotein metabolism and other lipidemia: Secondary | ICD-10-CM

## 2022-10-21 DIAGNOSIS — Z809 Family history of malignant neoplasm, unspecified: Secondary | ICD-10-CM

## 2022-10-21 DIAGNOSIS — Z951 Presence of aortocoronary bypass graft: Secondary | ICD-10-CM

## 2022-10-21 DIAGNOSIS — D631 Anemia in chronic kidney disease: Secondary | ICD-10-CM | POA: Diagnosis present

## 2022-10-21 DIAGNOSIS — Z91148 Patient's other noncompliance with medication regimen for other reason: Secondary | ICD-10-CM

## 2022-10-21 DIAGNOSIS — R748 Abnormal levels of other serum enzymes: Secondary | ICD-10-CM

## 2022-10-21 DIAGNOSIS — R079 Chest pain, unspecified: Secondary | ICD-10-CM

## 2022-10-21 DIAGNOSIS — Z1152 Encounter for screening for COVID-19: Secondary | ICD-10-CM

## 2022-10-21 DIAGNOSIS — F1410 Cocaine abuse, uncomplicated: Secondary | ICD-10-CM | POA: Diagnosis present

## 2022-10-21 DIAGNOSIS — Z7982 Long term (current) use of aspirin: Secondary | ICD-10-CM

## 2022-10-21 DIAGNOSIS — I2510 Atherosclerotic heart disease of native coronary artery without angina pectoris: Secondary | ICD-10-CM | POA: Diagnosis present

## 2022-10-21 DIAGNOSIS — I130 Hypertensive heart and chronic kidney disease with heart failure and stage 1 through stage 4 chronic kidney disease, or unspecified chronic kidney disease: Secondary | ICD-10-CM | POA: Diagnosis present

## 2022-10-21 DIAGNOSIS — Z8249 Family history of ischemic heart disease and other diseases of the circulatory system: Secondary | ICD-10-CM

## 2022-10-21 DIAGNOSIS — N184 Chronic kidney disease, stage 4 (severe): Secondary | ICD-10-CM | POA: Diagnosis present

## 2022-10-21 DIAGNOSIS — Z89421 Acquired absence of other right toe(s): Secondary | ICD-10-CM

## 2022-10-21 DIAGNOSIS — E785 Hyperlipidemia, unspecified: Secondary | ICD-10-CM | POA: Diagnosis present

## 2022-10-21 DIAGNOSIS — M86271 Subacute osteomyelitis, right ankle and foot: Secondary | ICD-10-CM

## 2022-10-21 DIAGNOSIS — I480 Paroxysmal atrial fibrillation: Secondary | ICD-10-CM | POA: Diagnosis present

## 2022-10-21 DIAGNOSIS — Z86711 Personal history of pulmonary embolism: Secondary | ICD-10-CM

## 2022-10-21 DIAGNOSIS — B20 Human immunodeficiency virus [HIV] disease: Secondary | ICD-10-CM

## 2022-10-21 DIAGNOSIS — E861 Hypovolemia: Secondary | ICD-10-CM | POA: Diagnosis present

## 2022-10-21 DIAGNOSIS — G629 Polyneuropathy, unspecified: Secondary | ICD-10-CM | POA: Diagnosis present

## 2022-10-21 DIAGNOSIS — R262 Difficulty in walking, not elsewhere classified: Secondary | ICD-10-CM | POA: Diagnosis present

## 2022-10-21 DIAGNOSIS — E11621 Type 2 diabetes mellitus with foot ulcer: Secondary | ICD-10-CM

## 2022-10-21 DIAGNOSIS — Z91199 Patient's noncompliance with other medical treatment and regimen due to unspecified reason: Secondary | ICD-10-CM

## 2022-10-21 DIAGNOSIS — M25552 Pain in left hip: Secondary | ICD-10-CM | POA: Diagnosis present

## 2022-10-21 DIAGNOSIS — R531 Weakness: Secondary | ICD-10-CM

## 2022-10-21 DIAGNOSIS — Z7951 Long term (current) use of inhaled steroids: Secondary | ICD-10-CM

## 2022-10-21 DIAGNOSIS — Z955 Presence of coronary angioplasty implant and graft: Secondary | ICD-10-CM

## 2022-10-21 DIAGNOSIS — Z833 Family history of diabetes mellitus: Secondary | ICD-10-CM

## 2022-10-21 DIAGNOSIS — M109 Gout, unspecified: Secondary | ICD-10-CM | POA: Diagnosis present

## 2022-10-21 DIAGNOSIS — Z7902 Long term (current) use of antithrombotics/antiplatelets: Secondary | ICD-10-CM

## 2022-10-21 DIAGNOSIS — E860 Dehydration: Secondary | ICD-10-CM | POA: Diagnosis present

## 2022-10-21 LAB — CBC WITH DIFFERENTIAL/PLATELET
Abs Immature Granulocytes: 0.02 10*3/uL (ref 0.00–0.07)
Basophils Absolute: 0 10*3/uL (ref 0.0–0.1)
Basophils Relative: 1 %
Eosinophils Absolute: 0.1 10*3/uL (ref 0.0–0.5)
Eosinophils Relative: 2 %
HCT: 25.7 % — ABNORMAL LOW (ref 39.0–52.0)
Hemoglobin: 8.4 g/dL — ABNORMAL LOW (ref 13.0–17.0)
Immature Granulocytes: 0 %
Lymphocytes Relative: 20 %
Lymphs Abs: 0.9 10*3/uL (ref 0.7–4.0)
MCH: 28.9 pg (ref 26.0–34.0)
MCHC: 32.7 g/dL (ref 30.0–36.0)
MCV: 88.3 fL (ref 80.0–100.0)
Monocytes Absolute: 0.3 10*3/uL (ref 0.1–1.0)
Monocytes Relative: 7 %
Neutro Abs: 3.2 10*3/uL (ref 1.7–7.7)
Neutrophils Relative %: 70 %
Platelets: 125 10*3/uL — ABNORMAL LOW (ref 150–400)
RBC: 2.91 MIL/uL — ABNORMAL LOW (ref 4.22–5.81)
RDW: 13.9 % (ref 11.5–15.5)
WBC: 4.6 10*3/uL (ref 4.0–10.5)
nRBC: 0 % (ref 0.0–0.2)

## 2022-10-21 LAB — URINALYSIS, ROUTINE W REFLEX MICROSCOPIC
Bacteria, UA: NONE SEEN
Bilirubin Urine: NEGATIVE
Glucose, UA: NEGATIVE mg/dL
Ketones, ur: NEGATIVE mg/dL
Leukocytes,Ua: NEGATIVE
Nitrite: NEGATIVE
Protein, ur: 100 mg/dL — AB
Specific Gravity, Urine: 1.015 (ref 1.005–1.030)
pH: 5 (ref 5.0–8.0)

## 2022-10-21 LAB — HEPATIC FUNCTION PANEL
ALT: 51 U/L — ABNORMAL HIGH (ref 0–44)
AST: 54 U/L — ABNORMAL HIGH (ref 15–41)
Albumin: 3.8 g/dL (ref 3.5–5.0)
Alkaline Phosphatase: 60 U/L (ref 38–126)
Bilirubin, Direct: <0.1 mg/dL (ref 0.0–0.2)
Total Bilirubin: 0.5 mg/dL (ref 0.3–1.2)
Total Protein: 6.7 g/dL (ref 6.5–8.1)

## 2022-10-21 LAB — RAPID URINE DRUG SCREEN, HOSP PERFORMED
Amphetamines: NOT DETECTED
Barbiturates: NOT DETECTED
Benzodiazepines: NOT DETECTED
Cocaine: POSITIVE — AB
Opiates: NOT DETECTED
Tetrahydrocannabinol: NOT DETECTED

## 2022-10-21 LAB — LIPASE, BLOOD: Lipase: 64 U/L — ABNORMAL HIGH (ref 11–51)

## 2022-10-21 LAB — CK: Total CK: 1916 U/L — ABNORMAL HIGH (ref 49–397)

## 2022-10-21 LAB — LACTIC ACID, PLASMA: Lactic Acid, Venous: 0.8 mmol/L (ref 0.5–1.9)

## 2022-10-21 LAB — BASIC METABOLIC PANEL
Anion gap: 8 (ref 5–15)
BUN: 58 mg/dL — ABNORMAL HIGH (ref 8–23)
CO2: 20 mmol/L — ABNORMAL LOW (ref 22–32)
Calcium: 8.7 mg/dL — ABNORMAL LOW (ref 8.9–10.3)
Chloride: 111 mmol/L (ref 98–111)
Creatinine, Ser: 4.37 mg/dL — ABNORMAL HIGH (ref 0.61–1.24)
GFR, Estimated: 14 mL/min — ABNORMAL LOW (ref >60)
Glucose, Bld: 117 mg/dL — ABNORMAL HIGH (ref 70–99)
Potassium: 4 mmol/L (ref 3.5–5.1)
Sodium: 139 mmol/L (ref 135–145)

## 2022-10-21 LAB — RESP PANEL BY RT-PCR (RSV, FLU A&B, COVID)  RVPGX2
Influenza A by PCR: NEGATIVE
Influenza B by PCR: NEGATIVE
Resp Syncytial Virus by PCR: NEGATIVE
SARS Coronavirus 2 by RT PCR: NEGATIVE

## 2022-10-21 LAB — TROPONIN I (HIGH SENSITIVITY)
Troponin I (High Sensitivity): 22 ng/L — ABNORMAL HIGH (ref <18)
Troponin I (High Sensitivity): 14 ng/L (ref <18)

## 2022-10-21 LAB — D-DIMER, QUANTITATIVE: D-Dimer, Quant: 0.65 ug/mL-FEU — ABNORMAL HIGH (ref 0.00–0.50)

## 2022-10-21 MED ORDER — DOLUTEGRAVIR SODIUM 50 MG PO TABS
50.0000 mg | ORAL_TABLET | Freq: Every day | ORAL | Status: DC
  Administered 2022-10-22 – 2022-10-23 (×2): 50 mg via ORAL
  Filled 2022-10-21 (×4): qty 1

## 2022-10-21 MED ORDER — ISOSORBIDE MONONITRATE ER 30 MG PO TB24
15.0000 mg | ORAL_TABLET | Freq: Every day | ORAL | Status: DC
  Administered 2022-10-21 – 2022-10-25 (×5): 15 mg via ORAL
  Filled 2022-10-21 (×5): qty 1

## 2022-10-21 MED ORDER — DILTIAZEM HCL ER COATED BEADS 120 MG PO CP24
120.0000 mg | ORAL_CAPSULE | Freq: Every day | ORAL | Status: DC
  Administered 2022-10-21 – 2022-10-25 (×5): 120 mg via ORAL
  Filled 2022-10-21 (×6): qty 1

## 2022-10-21 MED ORDER — HEPARIN SODIUM (PORCINE) 5000 UNIT/ML IJ SOLN
5000.0000 [IU] | Freq: Two times a day (BID) | INTRAMUSCULAR | Status: DC
  Administered 2022-10-21 – 2022-10-24 (×8): 5000 [IU] via SUBCUTANEOUS
  Filled 2022-10-21 (×8): qty 1

## 2022-10-21 MED ORDER — RILPIVIRINE HCL 25 MG PO TABS
25.0000 mg | ORAL_TABLET | Freq: Every day | ORAL | Status: DC
  Administered 2022-10-22 – 2022-10-25 (×4): 25 mg via ORAL
  Filled 2022-10-21 (×6): qty 1

## 2022-10-21 MED ORDER — SODIUM BICARBONATE 650 MG PO TABS
325.0000 mg | ORAL_TABLET | Freq: Two times a day (BID) | ORAL | Status: DC
  Administered 2022-10-21 – 2022-10-23 (×5): 325 mg via ORAL
  Filled 2022-10-21 (×6): qty 1

## 2022-10-21 MED ORDER — MOMETASONE FURO-FORMOTEROL FUM 200-5 MCG/ACT IN AERO
2.0000 | INHALATION_SPRAY | Freq: Two times a day (BID) | RESPIRATORY_TRACT | Status: DC
  Administered 2022-10-21 – 2022-10-24 (×7): 2 via RESPIRATORY_TRACT
  Filled 2022-10-21: qty 8.8

## 2022-10-21 MED ORDER — GABAPENTIN 300 MG PO CAPS
300.0000 mg | ORAL_CAPSULE | Freq: Every day | ORAL | Status: DC
  Administered 2022-10-21 – 2022-10-24 (×4): 300 mg via ORAL
  Filled 2022-10-21 (×4): qty 1

## 2022-10-21 MED ORDER — DOLUTEGRAVIR-RILPIVIRINE 50-25 MG PO TABS: 1.0000 | ORAL_TABLET | Freq: Every day | ORAL | Status: DC

## 2022-10-21 MED ORDER — LACTATED RINGERS IV BOLUS
500.0000 mL | Freq: Once | INTRAVENOUS | Status: AC
  Administered 2022-10-21: 500 mL via INTRAVENOUS

## 2022-10-21 MED ORDER — ACETAMINOPHEN 650 MG RE SUPP: 650.0000 mg | Freq: Four times a day (QID) | RECTAL | Status: DC | PRN

## 2022-10-21 MED ORDER — ASPIRIN 81 MG PO CHEW
81.0000 mg | CHEWABLE_TABLET | Freq: Every day | ORAL | Status: DC
  Administered 2022-10-21 – 2022-10-25 (×5): 81 mg via ORAL
  Filled 2022-10-21 (×5): qty 1

## 2022-10-21 MED ORDER — SODIUM CHLORIDE 0.9 % IV SOLN: INTRAVENOUS | Status: DC

## 2022-10-21 MED ORDER — ALBUTEROL SULFATE (2.5 MG/3ML) 0.083% IN NEBU: 2.5000 mg | INHALATION_SOLUTION | Freq: Four times a day (QID) | RESPIRATORY_TRACT | Status: DC | PRN

## 2022-10-21 MED ORDER — CLOPIDOGREL BISULFATE 75 MG PO TABS
75.0000 mg | ORAL_TABLET | Freq: Every day | ORAL | Status: DC
  Administered 2022-10-21 – 2022-10-25 (×5): 75 mg via ORAL
  Filled 2022-10-21 (×5): qty 1

## 2022-10-21 MED ORDER — MECLIZINE HCL 25 MG PO TABS: 25.0000 mg | ORAL_TABLET | Freq: Three times a day (TID) | ORAL | Status: DC | PRN

## 2022-10-21 MED ORDER — ACETAMINOPHEN 325 MG PO TABS
650.0000 mg | ORAL_TABLET | Freq: Four times a day (QID) | ORAL | Status: DC | PRN
  Administered 2022-10-24: 650 mg via ORAL
  Filled 2022-10-21: qty 2

## 2022-10-21 MED ORDER — BISACODYL 5 MG PO TBEC: 5.0000 mg | DELAYED_RELEASE_TABLET | Freq: Every day | ORAL | Status: DC | PRN

## 2022-10-21 MED ORDER — OXYCODONE HCL 5 MG PO TABS
5.0000 mg | ORAL_TABLET | ORAL | Status: DC | PRN
  Administered 2022-10-21: 5 mg via ORAL
  Filled 2022-10-21: qty 1

## 2022-10-21 MED ORDER — POLYETHYLENE GLYCOL 3350 17 G PO PACK
17.0000 g | PACK | Freq: Two times a day (BID) | ORAL | Status: DC
  Filled 2022-10-21 (×6): qty 1

## 2022-10-21 MED ORDER — FLUOXETINE HCL 20 MG PO CAPS
20.0000 mg | ORAL_CAPSULE | Freq: Every day | ORAL | Status: DC
  Administered 2022-10-21 – 2022-10-25 (×5): 20 mg via ORAL
  Filled 2022-10-21 (×5): qty 1

## 2022-10-21 MED ORDER — FERROUS SULFATE 325 (65 FE) MG PO TABS
325.0000 mg | ORAL_TABLET | Freq: Every day | ORAL | Status: DC
  Administered 2022-10-22 – 2022-10-23 (×2): 325 mg via ORAL
  Filled 2022-10-21 (×2): qty 1

## 2022-10-21 MED ORDER — UMECLIDINIUM BROMIDE 62.5 MCG/ACT IN AEPB
1.0000 | INHALATION_SPRAY | Freq: Every day | RESPIRATORY_TRACT | Status: DC
  Administered 2022-10-22 – 2022-10-24 (×3): 1 via RESPIRATORY_TRACT
  Filled 2022-10-21: qty 7

## 2022-10-21 MED ORDER — HYDRALAZINE HCL 50 MG PO TABS
50.0000 mg | ORAL_TABLET | Freq: Three times a day (TID) | ORAL | Status: DC
  Administered 2022-10-21 – 2022-10-25 (×11): 50 mg via ORAL
  Filled 2022-10-21: qty 2
  Filled 2022-10-21 (×3): qty 1
  Filled 2022-10-21: qty 2
  Filled 2022-10-21 (×4): qty 1
  Filled 2022-10-21: qty 2
  Filled 2022-10-21: qty 1

## 2022-10-21 NOTE — ED Notes (Signed)
Patient is sitting up eating dinner.

## 2022-10-21 NOTE — ED Notes (Signed)
Hospital bed ordered.

## 2022-10-21 NOTE — H&P (Addendum)
History and Physical    Michael [name] GGY:[id number] DOB: Aug 05, 1951 DOA: 10/21/2022  PCP: [name], MD (Confirm with patient/family/NH records and if not entered, this has to be entered at [facility] point of entry) Patient coming from: Home  I have personally briefly reviewed patient's old medical records in [place]  Chief Complaint: Left hip pain  HPI: Michael [name] is a 72 y.o. male with medical history significant of chronic HFpEF, PAF, CAD status post CABG/PCI, CKD stage III, HIV on HAART, cocaine abuse presented with mechanical fall and worsening of back pain and left hip pain.  Patient fell on left hip 2 days ago, has been limping on the left leg on a cane but reported worsening of left hip pain and unable to get out of bed since last night.  Significant reduced p.o. intake since yesterday given the ambulation dysfunction.  No LOC during the fall, any head or neck injury.  He reported reduced urine output and darkening color of his urine for last 2 days.  He still uses crack cocaine, but denies any chest pain  ED Course: Afebrile, none tachycardia nonhypotensive nonhypoxic.  Blood work showed worsening of kidney function creatinine 4.3 compared to baseline 3.2 last week bicarb 20, BUN 58.0.  K4.0 CK1900.  Review of Systems: As per HPI otherwise 14 point review of systems negative.    Past Medical History:  Diagnosis Date   A-fib ([place])    Anemia    Asthma    No PFTs, history of childhood asthma   CAD (coronary artery disease)    Cellulitis 04/2014   left facial   Cellulitis and abscess of toe of right foot 12/08/2019   Chondromalacia of medial femoral condyle    Left knee MRI 04/28/12: Chondromalacia of the medial femoral condyle with slight peripheral degeneration of the meniscocapsular junction of the medial meniscus; followed by sports medicine   Chronic kidney failure, stage 4 (severe) ([place])    Collagen vascular disease ([place])    Crack cocaine use     for 20+ years, has been enrolled in detox programs in the past   Depression    with history of hospitalization for suicidal ideation   Diabetes mellitus 2002   Diagnosed in 2002, started insulin in 2012   Gout    Headache(784.0)    CT head 08/2011: Periventricular and subcortical white matter hypodensities are most in keeping with chronic microangiopathic change   HIV infection ([place]) 08/2011   Followed by [name]   Hyperlipidemia    Hypertension    Pulmonary embolism [facility])     Past Surgical History:  Procedure Laterality Date   AMPUTATION Right 07/21/2019   Procedure: RIGHT SECOND TOE AMPUTATION;  Surgeon: [name], MD;  Location: [facility];  Service: Orthopedics;  Laterality: Right;   BACK SURGERY     1988   BOWEL RESECTION     CARDIAC SURGERY     CERVICAL SPINE SURGERY     " rods in my neck "   CORONARY ARTERY BYPASS GRAFT     CORONARY STENT PLACEMENT     NM MYOCAR PERF WALL MOTION  12/27/2011   normal   SPINE SURGERY       reports that he has never smoked. He has never used smokeless tobacco. He reports that he does not currently use drugs after having used the following drugs: "Crack" cocaine and Cocaine. Frequency: 4.00 times per week. He reports that he does not drink alcohol.  No Active Allergies  Family History  Problem Relation Age of Onset   Diabetes Mother    Hypertension Mother    Hyperlipidemia Mother    Diabetes Father    Cancer Father    Hypertension Father    Diabetes Brother    Heart disease Brother    Diabetes Sister    Colon cancer Neg Hx      Prior to Admission medications   Medication Sig Start Date End Date Taking? Authorizing Provider  Accu-Chek Softclix Lancets lancets Use as directed up to four times daily 09/06/22   Elodia Florence., MD  albuterol (VENTOLIN HFA) 108 (90 Base) MCG/ACT inhaler Inhale 1-2 puffs into the lungs every 6 (six) hours as needed for wheezing or shortness of breath. 09/06/22   Elodia Florence., MD   aspirin 81 MG chewable tablet Chew 1 tablet (81 mg total) by mouth daily. 09/06/22 11/05/22  Elodia Florence., MD  atorvastatin (LIPITOR) 40 MG tablet Take 1 tablet (40 mg total) by mouth daily. 09/06/22 11/05/22  Elodia Florence., MD  Blood Glucose Monitoring Suppl (ACCU-CHEK GUIDE) w/Device KIT Use as directed. 09/06/22   Elodia Florence., MD  budesonide-formoterol [facility]) 160-4.5 MCG/ACT inhaler Inhale 2 puffs into the lungs in the morning and at bedtime. 09/06/22   Elodia Florence., MD  clopidogrel (PLAVIX) 75 MG tablet Take 1 tablet (75 mg total) by mouth daily. 09/06/22 11/05/22  Elodia Florence., MD  diltiazem (CARDIZEM CD) 120 MG 24 hr capsule Take 1 capsule (120 mg total) by mouth daily. 09/07/22 11/06/22  Elodia Florence., MD  dolutegravir-rilpivirine (JULUCA) 50-25 MG tablet Take 1 tablet by mouth daily before lunch. 09/06/22   Elodia Florence., MD  ferrous sulfate 325 (65 FE) MG tablet Take 1 tablet (325 mg total) by mouth daily with breakfast. 10/19/22   Regalado, Belkys A, MD  FLUoxetine (PROZAC) 20 MG capsule Take 1 capsule (20 mg total) by mouth daily. 09/06/22 11/05/22  Elodia Florence., MD  gabapentin (NEURONTIN) 300 MG capsule Take 1 capsule (300 mg total) by mouth at bedtime. 09/06/22 11/05/22  Elodia Florence., MD  glucose blood test strip use as directed up to four times daily 09/06/22   Elodia Florence., MD  hydrALAZINE (APRESOLINE) 50 MG tablet Take 1 tablet (50 mg total) by mouth every 8 (eight) hours. 10/18/22   Regalado, Belkys A, MD  INCRUSE ELLIPTA 62.5 MCG/ACT AEPB Inhale 1 puff into the lungs daily. 09/06/22 11/05/22  Elodia Florence., MD  Insulin Pen Needle 31G X 5 MM MISC Use with insulin pens Patient taking differently: 1 each by Other route See admin instructions. Use with insulin pens 10/24/21   [name], MD  Insulin Pen Needle 32G X 4 MM MISC Use as directed up to 4 times daily. 09/06/22   Elodia Florence., MD  isosorbide mononitrate (IMDUR) 30 MG 24 hr tablet Take 0.5 tablets (15 mg total) by mouth daily. 09/07/22 11/06/22  Elodia Florence., MD  meclizine (ANTIVERT) 25 MG tablet Take 1 tablet (25 mg total) by mouth 3 (three) times daily as needed for dizziness. 10/18/22   Regalado, Belkys A, MD  multivitamin (RENA-VIT) TABS tablet Take 1 tablet by mouth at bedtime. 10/18/22   Regalado, Belkys A, MD  nitroGLYCERIN (NITROSTAT) 0.4 MG SL tablet Place 1 tablet (0.4 mg total) under the tongue every 5 (five) minutes as  needed for chest pain. 10/24/21   [name], MD  polyethylene glycol powder (GLYCOLAX/MIRALAX) 17 GM/SCOOP powder Take 17 g by mouth 2 (two) times daily. 10/18/22   Regalado, Belkys A, MD  sodium bicarbonate 650 MG tablet Take 0.5 tablets (325 mg total) by mouth 2 (two) times daily. 10/18/22   Regalado, Belkys A, MD  thiamine (VITAMIN B1) 100 MG tablet Take 1 tablet by mouth daily. 10/19/22   Elmarie Shiley, MD    Physical Exam: Vitals:   10/21/22 0626 10/21/22 0826 10/21/22 1145 10/21/22 1345  BP:  138/76 (!) 164/87 (!) 164/101  Pulse:  65 63 69  Resp:  '16 17 16  '$ Temp:  98.2 F (36.8 C) 98.1 F (36.7 C)   TempSrc:  Oral Oral   SpO2: 100% 100% 100% 99%  Weight:  76 kg    Height:  '5\' 8"'$  (1.727 m)      Constitutional: NAD, calm, comfortable Vitals:   10/21/22 0626 10/21/22 0826 10/21/22 1145 10/21/22 1345  BP:  138/76 (!) 164/87 (!) 164/101  Pulse:  65 63 69  Resp:  '16 17 16  '$ Temp:  98.2 F (36.8 C) 98.1 F (36.7 C)   TempSrc:  Oral Oral   SpO2: 100% 100% 100% 99%  Weight:  76 kg    Height:  '5\' 8"'$  (1.727 m)     Eyes: PERRL, lids and conjunctivae normal ENMT: Mucous membranes are dry. Posterior pharynx clear of any exudate or lesions.Normal dentition.  Neck: normal, supple, no masses, no thyromegaly Respiratory: clear to auscultation bilaterally, no wheezing, no crackles. Normal respiratory effort. No accessory muscle use.  Cardiovascular:  Regular rate and rhythm, no murmurs / rubs / gallops. No extremity edema. 2+ pedal pulses. No carotid bruits.  Abdomen: no tenderness, no masses palpated. No hepatosplenomegaly. Bowel sounds positive.  Musculoskeletal: Significantly restricted ROM of left hip, with severe tenderness on passive movement of left hip Skin: no rashes, lesions, ulcers. No induration Neurologic: CN 2-12 grossly intact. Sensation intact, DTR normal. Strength 5/5 in all 4.  Psychiatric: Normal judgment and insight. Alert and oriented x 3. Normal mood.     Labs on Admission: I have personally reviewed following labs and imaging studies  CBC: Recent Labs  Lab 10/16/22 0320 10/18/22 0824 10/21/22 0640  WBC 3.3* 3.6* 4.6  NEUTROABS  --   --  3.2  HGB 7.8* 7.8* 8.4*  HCT 23.5* 24.4* 25.7*  MCV 88.7 89.1 88.3  PLT 102* 97* 413*   Basic Metabolic Panel: Recent Labs  Lab 10/16/22 0320 10/18/22 0824 10/21/22 0640  NA 139 140 139  K 3.9 4.0 4.0  CL 109 111 111  CO2 23 23 20*  GLUCOSE 106* 107* 117*  BUN 28* 29* 58*  CREATININE 3.13* 3.24* 4.37*  CALCIUM 8.4* 8.7* 8.7*   GFR: Estimated Creatinine Clearance: 14.8 mL/min (A) (by C-G formula based on SCr of 4.37 mg/dL (H)). Liver Function Tests: Recent Labs  Lab 10/21/22 1127  AST 54*  ALT 51*  ALKPHOS 60  BILITOT 0.5  PROT 6.7  ALBUMIN 3.8   Recent Labs  Lab 10/21/22 1127  LIPASE 64*   No results for input(s): "AMMONIA" in the last 168 hours. Coagulation Profile: No results for input(s): "INR", "PROTIME" in the last 168 hours. Cardiac Enzymes: Recent Labs  Lab 10/21/22 0640  CKTOTAL 1,916*   BNP (last 3 results) No results for input(s): "PROBNP" in the last 8760 hours. HbA1C: No results for input(s): "HGBA1C" in the last  72 hours. CBG: No results for input(s): "GLUCAP" in the last 168 hours. Lipid Profile: No results for input(s): "CHOL", "HDL", "LDLCALC", "TRIG", "CHOLHDL", "LDLDIRECT" in the last 72 hours. Thyroid Function  Tests: No results for input(s): "TSH", "T4TOTAL", "FREET4", "T3FREE", "THYROIDAB" in the last 72 hours. Anemia Panel: No results for input(s): "VITAMINB12", "FOLATE", "FERRITIN", "TIBC", "IRON", "RETICCTPCT" in the last 72 hours. Urine analysis:    Component Value Date/Time   COLORURINE YELLOW 10/21/2022 1318   APPEARANCEUR CLEAR 10/21/2022 1318   LABSPEC 1.015 10/21/2022 1318   PHURINE 5.0 10/21/2022 1318   GLUCOSEU NEGATIVE 10/21/2022 1318   HGBUR SMALL (A) 10/21/2022 1318   BILIRUBINUR NEGATIVE 10/21/2022 1318   KETONESUR NEGATIVE 10/21/2022 1318   PROTEINUR 100 (A) 10/21/2022 1318   UROBILINOGEN 0.2 08/13/2015 1030   NITRITE NEGATIVE 10/21/2022 1318   LEUKOCYTESUR NEGATIVE 10/21/2022 1318    Radiological Exams on Admission: DG Lumbar Spine Complete  Result Date: 10/21/2022 CLINICAL DATA:  Chest pain and low back pain for 1 day. EXAM: LUMBAR SPINE - COMPLETE 4+ VIEW COMPARISON:  September 26, 2022 FINDINGS: There is no evidence of lumbar spine fracture. Grade 1 anterolisthesis of L4 on L5 is identified unchanged. Postsurgical changes in the posterior sacrum unchanged. Mild degenerative joint changes with anterior spurring, narrow intervertebral space is identified. IMPRESSION: No acute fracture or dislocation identified in the lumbar spine. Degenerative joint changes. Electronically Signed   By: Abelardo Diesel M.D.   On: 10/21/2022 13:14   DG Chest 2 View  Result Date: 10/21/2022 CLINICAL DATA:  Chest pain and low back pain for 1 day. EXAM: CHEST - 2 VIEW COMPARISON:  September 23, 2022 FINDINGS: The heart size and mediastinal contours are stable. Both lungs are clear. The visualized skeletal structures are stable. IMPRESSION: No active cardiopulmonary disease. Electronically Signed   By: Abelardo Diesel M.D.   On: 10/21/2022 13:12    EKG: Independently reviewed.  Sinus rhythm, no acute ST changes.  Assessment/Plan Principal Problem:   AKI (acute kidney injury) ([place]) Active  Problems:   ARF (acute renal failure) ([name])   (HFpEF) heart failure with preserved ejection fraction ([name])   Back pain   CKD (chronic kidney disease), stage IV ([place])  (please populate well all problems here in Problem List. (For example, if patient is on BP meds at home and you resume or decide to hold them, it is a problem that needs to be her. Same for CAD, COPD, HLD and so on)  AKI on CKD stage III -Hypovolemic secondary dehydration from poor oral intake decreased ambulation dysfunction after fall for last 2 days -Clinically still appears to be volume contracted, continue IV fluid -CK elevated but no significant myoglobin in the UA, not compatible with rhabdomyolysis, IVF and repeat CK level tomorrow -Appears the patient has persistent chronic proteinuria, at least 1+ in all UA analysis in last year, check microalbumin/creatinine ratio to rule out nephrotis/nephrotic syndrome.  Left hip pain and acute ambulation dysfunction -Ordered x-ray to rule out fracture/dislocation -Symptomatic management -PT evaluation  Cocaine abuse -Patient reported ongoing cocaine use and UDS positive for cocaine -No chest pain, troponin level remains low  PAF -In sinus rhythm, continue Cardizem -Not on systemic anticoagulation due to history of severe bleeding.  Chronic normocytic anemia -Might related to CKD, check iron study and reticulocyte count -H&H about at baseline  HIV -On Jul[place], reported has been compliant  IIDM -A1c 7.7, on sliding scale for now  COPD -No acute symptoms signs of exacerbation  DVT prophylaxis: Heparin subcu Code Status: Full code Family Communication: None at bedside Disposition Plan: Expect less than 2 midnight hospital stay Consults called: None Admission status: Tele obs   Lequita Halt MD [facility] Pager [phone]  10/21/2022, 2:42 PM

## 2022-10-21 NOTE — Progress Notes (Signed)
VASCULAR LAB    Left lower extremity venous duplex has been performed.  See CV proc for preliminary results.   Omarii Scalzo, RVT 10/21/2022, 2:46 PM

## 2022-10-21 NOTE — ED Triage Notes (Signed)
[place] [facility] [place] [facility] [place] c/o generalized pain that has been there since he was seen on Friday.

## 2022-10-21 NOTE — ED Notes (Signed)
Nurse advised Pt no longer needs urine sample

## 2022-10-21 NOTE — ED Notes (Signed)
Patient transported to imaging.

## 2022-10-21 NOTE — ED Provider Triage Note (Signed)
Emergency Medicine Provider Triage Evaluation Note  Michael [name] , a 72 y.o. male  was evaluated in triage.  Pt complains of diffuse body pain. States that symptoms are the same as they were at hospital discharge on Friday. Had 1.5 month hospitalization from 47/3/40-3/70/96; discharge complicated by inability to place in SNF. Per EMS, patient presently homeless with no place to go.  Review of Systems  Positive: As above Negative: As above  Physical Exam  SpO2 100%  Gen:   Awake, no distress   Resp:  Normal effort  MSK:   Moves extremities without difficulty  Other:  Ambulation not assessed  Medical Decision Making  Medically screening exam initiated at 6:30 AM.  Appropriate orders placed.  [name] was informed that the remainder of the evaluation will be completed by another provider, this initial triage assessment does not replace that evaluation, and the importance of remaining in the ED until their evaluation is complete.  Diffuse myalgias, ongoing - basic labs ordered for trending.   Antonietta Breach, PA-C 10/21/22 [phone]

## 2022-10-21 NOTE — ED Notes (Signed)
Pt having multiple loose bowel movements. Refused HS miralax

## 2022-10-21 NOTE — ED Provider Notes (Addendum)
[facility] EMERGENCY DEPARTMENT Provider Note   CSN: [id number] Arrival date & time: 10/21/22  4098     History  Chief Complaint  Patient presents with   Generalized Body Aches    Vale Mousseau is a 72 y.o. male.  HPI     72 year old male with a history of hypertension, chronic heart failure, paroxysmal atrial fibrillation, coronary artery disease status post CABG/PCI, CKD, HIV, cocaine abuse, recent admission 12/4-1/12 for significant AKI who presents again with chest pain, back pain, body aches, generalized weakness.    Reports he felt better when he left but on Saturday began to develop symptoms of generalized weakness, left leg pain. Chest pain has been constant, worse with deep breaths. No nausea, vomiting, fever, cough.    Past Medical History:  Diagnosis Date   A-fib ([place])    Anemia    Asthma    No PFTs, history of childhood asthma   CAD (coronary artery disease)    Cellulitis 04/2014   left facial   Cellulitis and abscess of toe of right foot 12/08/2019   Chondromalacia of medial femoral condyle    Left knee MRI 04/28/12: Chondromalacia of the medial femoral condyle with slight peripheral degeneration of the meniscocapsular junction of the medial meniscus; followed by sports medicine   Chronic kidney failure, stage 4 (severe) ([place])    Collagen vascular disease ([place])    Crack cocaine use    for 20+ years, has been enrolled in detox programs in the past   Depression    with history of hospitalization for suicidal ideation   Diabetes mellitus 2002   Diagnosed in 2002, started insulin in 2012   Gout    Headache(784.0)    CT head 08/2011: Periventricular and subcortical white matter hypodensities are most in keeping with chronic microangiopathic change   HIV infection ([place]) 08/2011   Followed by [name]   Hyperlipidemia    Hypertension    Pulmonary embolism ([place])      Home Medications Prior to Admission medications   Medication  Sig Start Date End Date Taking? Authorizing Provider  Accu-Chek Softclix Lancets lancets Use as directed up to four times daily 09/06/22   Elodia Florence., MD  albuterol (VENTOLIN HFA) 108 (90 Base) MCG/ACT inhaler Inhale 1-2 puffs into the lungs every 6 (six) hours as needed for wheezing or shortness of breath. 09/06/22   Elodia Florence., MD  aspirin 81 MG chewable tablet Chew 1 tablet (81 mg total) by mouth daily. 09/06/22 11/05/22  Elodia Florence., MD  atorvastatin (LIPITOR) 40 MG tablet Take 1 tablet (40 mg total) by mouth daily. 09/06/22 11/05/22  Elodia Florence., MD  Blood Glucose Monitoring Suppl (ACCU-CHEK GUIDE) w/Device KIT Use as directed. 09/06/22   Elodia Florence., MD  budesonide-formoterol [facility]) 160-4.5 MCG/ACT inhaler Inhale 2 puffs into the lungs in the morning and at bedtime. 09/06/22   Elodia Florence., MD  clopidogrel (PLAVIX) 75 MG tablet Take 1 tablet (75 mg total) by mouth daily. 09/06/22 11/05/22  Elodia Florence., MD  diltiazem (CARDIZEM CD) 120 MG 24 hr capsule Take 1 capsule (120 mg total) by mouth daily. 09/07/22 11/06/22  Elodia Florence., MD  dolutegravir-rilpivirine (JULUCA) 50-25 MG tablet Take 1 tablet by mouth daily before lunch. 09/06/22   Elodia Florence., MD  ferrous sulfate 325 (65 FE) MG tablet Take 1 tablet (325 mg total) by mouth daily with breakfast.  10/19/22   Regalado, Belkys A, MD  FLUoxetine (PROZAC) 20 MG capsule Take 1 capsule (20 mg total) by mouth daily. 09/06/22 11/05/22  Elodia Florence., MD  gabapentin (NEURONTIN) 300 MG capsule Take 1 capsule (300 mg total) by mouth at bedtime. 09/06/22 11/05/22  Elodia Florence., MD  glucose blood test strip use as directed up to four times daily 09/06/22   Elodia Florence., MD  hydrALAZINE (APRESOLINE) 50 MG tablet Take 1 tablet (50 mg total) by mouth every 8 (eight) hours. 10/18/22   Regalado, Belkys A, MD  INCRUSE ELLIPTA 62.5 MCG/ACT AEPB Inhale 1  puff into the lungs daily. 09/06/22 11/05/22  Elodia Florence., MD  Insulin Pen Needle 31G X 5 MM MISC Use with insulin pens Patient taking differently: 1 each by Other route See admin instructions. Use with insulin pens 10/24/21   [name], MD  Insulin Pen Needle 32G X 4 MM MISC Use as directed up to 4 times daily. 09/06/22   Elodia Florence., MD  isosorbide mononitrate (IMDUR) 30 MG 24 hr tablet Take 0.5 tablets (15 mg total) by mouth daily. 09/07/22 11/06/22  Elodia Florence., MD  meclizine (ANTIVERT) 25 MG tablet Take 1 tablet (25 mg total) by mouth 3 (three) times daily as needed for dizziness. 10/18/22   Regalado, Belkys A, MD  multivitamin (RENA-VIT) TABS tablet Take 1 tablet by mouth at bedtime. 10/18/22   Regalado, Belkys A, MD  nitroGLYCERIN (NITROSTAT) 0.4 MG SL tablet Place 1 tablet (0.4 mg total) under the tongue every 5 (five) minutes as needed for chest pain. 10/24/21   [name], MD  polyethylene glycol powder (GLYCOLAX/MIRALAX) 17 GM/SCOOP powder Take 17 g by mouth 2 (two) times daily. 10/18/22   Regalado, Belkys A, MD  sodium bicarbonate 650 MG tablet Take 0.5 tablets (325 mg total) by mouth 2 (two) times daily. 10/18/22   Regalado, Belkys A, MD  thiamine (VITAMIN B1) 100 MG tablet Take 1 tablet by mouth daily. 10/19/22   Regalado, Cassie Freer, MD      Allergies    Patient has no active allergies.    Review of Systems   Review of Systems  Physical Exam Updated Vital Signs BP 124/83   Pulse 69   Temp 98.4 F (36.9 C) (Oral)   Resp 15   Ht '5\' 8"'$  (1.727 m)   Wt 76 kg   SpO2 100%   BMI 25.48 kg/m  Physical Exam Vitals and nursing note reviewed.  Constitutional:      General: He is not in acute distress.    Appearance: He is well-developed. He is not diaphoretic.  HENT:     Head: Normocephalic and atraumatic.  Eyes:     Conjunctiva/sclera: Conjunctivae normal.  Cardiovascular:     Rate and Rhythm: Normal rate and regular rhythm.     Heart  sounds: Normal heart sounds. No murmur heard.    No friction rub. No gallop.  Pulmonary:     Effort: Pulmonary effort is normal. No respiratory distress.     Breath sounds: Normal breath sounds. No wheezing or rales.  Abdominal:     General: There is no distension.     Palpations: Abdomen is soft.     Tenderness: There is no abdominal tenderness. There is no guarding.  Musculoskeletal:        General: Tenderness (LLE) present.     Cervical back: Normal range of motion.  Skin:  General: Skin is warm and dry.  Neurological:     Mental Status: He is alert and oriented to person, place, and time.     ED Results / Procedures / Treatments   Labs (all labs ordered are listed, but only abnormal results are displayed) Labs Reviewed  RAPID URINE DRUG SCREEN, HOSP PERFORMED - Abnormal; Notable for the following components:      Result Value   Cocaine POSITIVE (*)    All other components within normal limits  CBC WITH DIFFERENTIAL/PLATELET - Abnormal; Notable for the following components:   RBC 2.91 (*)    Hemoglobin 8.4 (*)    HCT 25.7 (*)    Platelets 125 (*)    All other components within normal limits  BASIC METABOLIC PANEL - Abnormal; Notable for the following components:   CO2 20 (*)    Glucose, Bld 117 (*)    BUN 58 (*)    Creatinine, Ser 4.37 (*)    Calcium 8.7 (*)    GFR, Estimated 14 (*)    All other components within normal limits  CK - Abnormal; Notable for the following components:   Total CK 1,916 (*)    All other components within normal limits  D-DIMER, QUANTITATIVE - Abnormal; Notable for the following components:   D-Dimer, Quant 0.65 (*)    All other components within normal limits  LIPASE, BLOOD - Abnormal; Notable for the following components:   Lipase 64 (*)    All other components within normal limits  HEPATIC FUNCTION PANEL - Abnormal; Notable for the following components:   AST 54 (*)    ALT 51 (*)    All other components within normal limits   URINALYSIS, ROUTINE W REFLEX MICROSCOPIC - Abnormal; Notable for the following components:   Hgb urine dipstick SMALL (*)    Protein, ur 100 (*)    All other components within normal limits  CBC - Abnormal; Notable for the following components:   WBC 3.6 (*)    RBC 2.60 (*)    Hemoglobin 7.3 (*)    HCT 23.0 (*)    Platelets 100 (*)    All other components within normal limits  BASIC METABOLIC PANEL - Abnormal; Notable for the following components:   Chloride 114 (*)    CO2 18 (*)    Glucose, Bld 130 (*)    BUN 53 (*)    Creatinine, Ser 3.55 (*)    Calcium 7.8 (*)    GFR, Estimated 17 (*)    All other components within normal limits  CK - Abnormal; Notable for the following components:   Total CK 752 (*)    All other components within normal limits  TROPONIN I (HIGH SENSITIVITY) - Abnormal; Notable for the following components:   Troponin I (High Sensitivity) 22 (*)    All other components within normal limits  RESP PANEL BY RT-PCR (RSV, FLU A&B, COVID)  RVPGX2  LACTIC ACID, PLASMA  MICROALBUMIN / CREATININE URINE RATIO  IRON AND TIBC  RETICULOCYTES  TROPONIN I (HIGH SENSITIVITY)    EKG None  Radiology CT HIP LEFT WO CONTRAST  Result Date: 10/21/2022 CLINICAL DATA:  [place] 2 days ago. Left hip pain. EXAM: CT OF THE LEFT HIP WITHOUT CONTRAST TECHNIQUE: Multidetector CT imaging of the left hip was performed according to the standard protocol. Multiplanar CT image reconstructions were also generated. RADIATION DOSE REDUCTION: This exam was performed according to the departmental dose-optimization program which includes automated exposure control, adjustment of the  mA and/or kV according to patient size and/or use of iterative reconstruction technique. COMPARISON:  CT scan 02/14/2022 FINDINGS: The left hip is normally located. Severe left hip joint degenerative changes with marked joint space narrowing, osteophytic spurring, bony eburnation, subchondral cystic change and loose  ossified bodies. No acute fracture or evidence of AVN. Large stable os acetabuli. The pubic symphysis and SI joints are intact. No pelvic fractures are identified. The left hip and pelvic musculature are grossly normal by CT. No obvious large intramuscular hematoma. No subcutaneous hematoma. No significant intrapelvic abnormalities are identified. IMPRESSION: Severe left hip joint degenerative changes but no acute fracture or evidence of AVN. Intact visualized bony pelvis. Electronically Signed   By: Marijo Sanes M.D.   On: 10/21/2022 17:43   VAS [place] LOWER EXTREMITY VENOUS (DVT) (ONLY [facility] & [facility])  Result Date: 10/21/2022  Lower Venous DVT Study Patient Name:  BRACKEN MOFFA  Date of Exam:   10/21/2022 Medical Rec #: [id number]            Accession #:    [id number] Date of Birth: 03/31/1951            Patient Gender: M Patient Age:   1 years Exam Location:  [facility] Procedure:      VAS [place] LOWER EXTREMITY VENOUS (DVT) Referring Phys: Gareth Morgan --------------------------------------------------------------------------------  Indications: Pain.  Comparison Study: Prior negative bilateral LEV done 07/17/2019 Performing Technologist: Sharion Dove RVS  Examination Guidelines: A complete evaluation includes B-mode imaging, spectral Doppler, color Doppler, and power Doppler as needed of all accessible portions of each vessel. Bilateral testing is considered an integral part of a complete examination. Limited examinations for reoccurring indications may be performed as noted. The reflux portion of the exam is performed with the patient in reverse Trendelenburg.  +-----+---------------+---------+-----------+----------+--------------+ RIGHTCompressibilityPhasicitySpontaneityPropertiesThrombus Aging +-----+---------------+---------+-----------+----------+--------------+ CFV  Full           Yes      Yes                                  +-----+---------------+---------+-----------+----------+--------------+   +---------+---------------+---------+-----------+----------+--------------+ LEFT     CompressibilityPhasicitySpontaneityPropertiesThrombus Aging +---------+---------------+---------+-----------+----------+--------------+ CFV      Full           Yes      Yes                                 +---------+---------------+---------+-----------+----------+--------------+ SFJ      Full                                                        +---------+---------------+---------+-----------+----------+--------------+ FV Prox  Full                                                        +---------+---------------+---------+-----------+----------+--------------+ FV Mid   Full                                                        +---------+---------------+---------+-----------+----------+--------------+  FV DistalFull                                                        +---------+---------------+---------+-----------+----------+--------------+ PFV      Full                                                        +---------+---------------+---------+-----------+----------+--------------+ POP      Full           Yes      Yes                                 +---------+---------------+---------+-----------+----------+--------------+ PTV      Full                                                        +---------+---------------+---------+-----------+----------+--------------+ PERO     Full                                                        +---------+---------------+---------+-----------+----------+--------------+     Summary: RIGHT: - No evidence of common femoral vein obstruction.  LEFT: - There is no evidence of deep vein thrombosis in the lower extremity.  - No cystic structure found in the popliteal fossa.  *See table(s) above for measurements and observations. Electronically signed  by Harold Barban MD on 10/21/2022 at 5:38:49 PM.    Final    DG HIP UNILAT WITH PELVIS 2-3 VIEWS LEFT  Result Date: 10/21/2022 CLINICAL DATA:  Patient fell on left hip 2 days ago EXAM: DG HIP (WITH OR WITHOUT PELVIS) 3V LEFT COMPARISON:  12/16/2016 x-ray.  CT abdomen pelvis 10/13/2022 FINDINGS: There is a question of disorganized trabecula along the femoral neck on one view. This could be technical though if there is concern specifically of the hip fracture a CT or MRI can be performed to exclude a nondisplaced injury. Otherwise no acute fracture or dislocation. Moderate joint space loss of the left hip with osteophytes. Mild on the right. Hyperostosis. Vascular calcifications are seen in the pelvis. There is a focal density in the tissues adjacent to the left greater trochanter. This is of uncertain etiology and was not present on the prior CT scan. Please correlate for artifact. IMPRESSION: Degenerative changes seen greatest of the left hip. On one view there is some subtle trabecular changes along the femoral neck. This could be technical rather than a true injury. If there is concern of fracture a CT or MRI may be of benefit to confirm a nondisplaced injury. Possible overlapping artifact or structure with a rounded density overlying the left hip. Please correlate with clinical findings. Electronically Signed   By: Jill Side M.D.   On: 10/21/2022 15:32   DG Lumbar Spine Complete  Result Date: 10/21/2022 CLINICAL DATA:  Chest pain and low back pain for 1 day. EXAM: LUMBAR SPINE - COMPLETE 4+ VIEW COMPARISON:  September 26, 2022 FINDINGS: There is no evidence of lumbar spine fracture. Grade 1 anterolisthesis of L4 on L5 is identified unchanged. Postsurgical changes in the posterior sacrum unchanged. Mild degenerative joint changes with anterior spurring, narrow intervertebral space is identified. IMPRESSION: No acute fracture or dislocation identified in the lumbar spine. Degenerative joint changes.  Electronically Signed   By: Abelardo Diesel M.D.   On: 10/21/2022 13:14   DG Chest 2 View  Result Date: 10/21/2022 CLINICAL DATA:  Chest pain and low back pain for 1 day. EXAM: CHEST - 2 VIEW COMPARISON:  September 23, 2022 FINDINGS: The heart size and mediastinal contours are stable. Both lungs are clear. The visualized skeletal structures are stable. IMPRESSION: No active cardiopulmonary disease. Electronically Signed   By: Abelardo Diesel M.D.   On: 10/21/2022 13:12    Procedures Procedures    Medications Ordered in ED Medications  aspirin chewable tablet 81 mg (81 mg Oral Given 10/22/22 0953)  diltiazem (CARDIZEM CD) 24 hr capsule 120 mg (120 mg Oral Given 10/22/22 0954)  hydrALAZINE (APRESOLINE) tablet 50 mg (50 mg Oral Given 10/22/22 0649)  isosorbide mononitrate (IMDUR) 24 hr tablet 15 mg (15 mg Oral Given 10/22/22 0954)  FLUoxetine (PROZAC) capsule 20 mg (20 mg Oral Given 10/22/22 0955)  meclizine (ANTIVERT) tablet 25 mg (has no administration in time range)  polyethylene glycol (MIRALAX / GLYCOLAX) packet 17 g (17 g Oral Not Given 10/22/22 0956)  sodium bicarbonate tablet 325 mg (325 mg Oral Given 10/22/22 0956)  clopidogrel (PLAVIX) tablet 75 mg (75 mg Oral Given 10/22/22 0955)  ferrous sulfate tablet 325 mg (325 mg Oral Given 10/22/22 0808)  gabapentin (NEURONTIN) capsule 300 mg (300 mg Oral Given 10/21/22 2236)  albuterol (PROVENTIL) (2.5 MG/3ML) 0.083% nebulizer solution 2.5 mg (has no administration in time range)  mometasone-formoterol (DULERA) 200-5 MCG/ACT inhaler 2 puff (2 puffs Inhalation Given 10/22/22 0809)  umeclidinium bromide (INCRUSE ELLIPTA) 62.5 MCG/ACT 1 puff (1 puff Inhalation Given 10/22/22 0959)  heparin injection 5,000 Units (5,000 Units Subcutaneous Given 10/22/22 0956)  acetaminophen (TYLENOL) tablet 650 mg (has no administration in time range)    Or  acetaminophen (TYLENOL) suppository 650 mg (has no administration in time range)  bisacodyl (DULCOLAX) EC tablet 5 mg  (has no administration in time range)  dolutegravir (TIVICAY) tablet 50 mg (50 mg Oral Given 10/22/22 0811)  rilpivirine (EDURANT) tablet 25 mg (25 mg Oral Given 10/22/22 0811)  oxyCODONE (Oxy IR/ROXICODONE) immediate release tablet 5 mg (5 mg Oral Given 10/22/22 0817)  0.9 %  sodium chloride infusion ( Intravenous New Bag/Given 10/22/22 0827)  lactated ringers bolus 500 mL (0 mLs Intravenous Stopped 10/21/22 1242)  lactated ringers bolus 500 mL (0 mLs Intravenous Stopped 10/21/22 1600)    ED Course/ Medical Decision Making/ A&P                              72 year old male with a history of hypertension, chronic heart failure, paroxysmal atrial fibrillation, coronary artery disease status post CABG/PCI, CKD, HIV, cocaine abuse, recent admission 12/4-1/12 for significant AKI who presents again with chest pain, back pain, body aches, generalized weakness.    Reports left leg pain-normal pulses. DVT study ordered given pain and recent hospitalization.  XR lumbar spine no acute findings. Possible radicular pain. DVT study  pending.   Chest pain-Differential diagnosis for chest pain includes pulmonary embolus, dissection, pneumothorax, pneumonia, ACS, myocarditis, pericarditis.  EKG was done and evaluate by me and showed no acute ST changes and no signs of pericarditis. Chest x-ray was done and evaluated by me and radiology and showed no sign of pneumonia or pneumothorax. Do not feel history or exam are consistent with aortic dissection. Troponin stable low elevation.  DDimer technically elevated but age adjusted normal.  Labs obtained and personally evaluated by me show again worsening renal function and elevated CK>  Will admit in setting of worsening symptoms, worsening renal failure and elevated CK.          Final Clinical Impression(s) / ED Diagnoses Final diagnoses:  Acute renal failure superimposed on chronic kidney disease, unspecified acute renal failure type, unspecified CKD stage ([name])   Elevated CK  Generalized weakness  Left leg pain  Chest pain, unspecified type    Rx / DC Orders ED Discharge Orders     None         Gareth Morgan, MD 10/22/22 1123    Gareth Morgan, MD 11/08/22 1307

## 2022-10-21 NOTE — ED Notes (Signed)
Pt back from imaging at this time

## 2022-10-21 NOTE — ED Notes (Signed)
Pt placed on hospital bed

## 2022-10-22 DIAGNOSIS — N184 Chronic kidney disease, stage 4 (severe): Secondary | ICD-10-CM | POA: Diagnosis present

## 2022-10-22 DIAGNOSIS — Z955 Presence of coronary angioplasty implant and graft: Secondary | ICD-10-CM | POA: Diagnosis not present

## 2022-10-22 DIAGNOSIS — E1122 Type 2 diabetes mellitus with diabetic chronic kidney disease: Secondary | ICD-10-CM | POA: Diagnosis present

## 2022-10-22 DIAGNOSIS — Z21 Asymptomatic human immunodeficiency virus [HIV] infection status: Secondary | ICD-10-CM | POA: Diagnosis present

## 2022-10-22 DIAGNOSIS — Z91148 Patient's other noncompliance with medication regimen for other reason: Secondary | ICD-10-CM | POA: Diagnosis not present

## 2022-10-22 DIAGNOSIS — Z951 Presence of aortocoronary bypass graft: Secondary | ICD-10-CM | POA: Diagnosis not present

## 2022-10-22 DIAGNOSIS — I130 Hypertensive heart and chronic kidney disease with heart failure and stage 1 through stage 4 chronic kidney disease, or unspecified chronic kidney disease: Secondary | ICD-10-CM | POA: Diagnosis present

## 2022-10-22 DIAGNOSIS — G629 Polyneuropathy, unspecified: Secondary | ICD-10-CM | POA: Diagnosis present

## 2022-10-22 DIAGNOSIS — I2510 Atherosclerotic heart disease of native coronary artery without angina pectoris: Secondary | ICD-10-CM | POA: Diagnosis present

## 2022-10-22 DIAGNOSIS — M79605 Pain in left leg: Secondary | ICD-10-CM | POA: Diagnosis present

## 2022-10-22 DIAGNOSIS — R262 Difficulty in walking, not elsewhere classified: Secondary | ICD-10-CM | POA: Diagnosis present

## 2022-10-22 DIAGNOSIS — W1830XA Fall on same level, unspecified, initial encounter: Secondary | ICD-10-CM | POA: Diagnosis present

## 2022-10-22 DIAGNOSIS — E860 Dehydration: Secondary | ICD-10-CM | POA: Diagnosis present

## 2022-10-22 DIAGNOSIS — J449 Chronic obstructive pulmonary disease, unspecified: Secondary | ICD-10-CM | POA: Diagnosis present

## 2022-10-22 DIAGNOSIS — Z1152 Encounter for screening for COVID-19: Secondary | ICD-10-CM | POA: Diagnosis not present

## 2022-10-22 DIAGNOSIS — I480 Paroxysmal atrial fibrillation: Secondary | ICD-10-CM | POA: Diagnosis present

## 2022-10-22 DIAGNOSIS — Z79899 Other long term (current) drug therapy: Secondary | ICD-10-CM | POA: Diagnosis not present

## 2022-10-22 DIAGNOSIS — M109 Gout, unspecified: Secondary | ICD-10-CM | POA: Diagnosis present

## 2022-10-22 DIAGNOSIS — I5032 Chronic diastolic (congestive) heart failure: Secondary | ICD-10-CM | POA: Diagnosis present

## 2022-10-22 DIAGNOSIS — E785 Hyperlipidemia, unspecified: Secondary | ICD-10-CM | POA: Diagnosis present

## 2022-10-22 DIAGNOSIS — D631 Anemia in chronic kidney disease: Secondary | ICD-10-CM | POA: Diagnosis present

## 2022-10-22 DIAGNOSIS — Y92009 Unspecified place in unspecified non-institutional (private) residence as the place of occurrence of the external cause: Secondary | ICD-10-CM | POA: Diagnosis not present

## 2022-10-22 DIAGNOSIS — N179 Acute kidney failure, unspecified: Secondary | ICD-10-CM | POA: Diagnosis present

## 2022-10-22 DIAGNOSIS — F1410 Cocaine abuse, uncomplicated: Secondary | ICD-10-CM | POA: Diagnosis present

## 2022-10-22 DIAGNOSIS — F32A Depression, unspecified: Secondary | ICD-10-CM | POA: Diagnosis present

## 2022-10-22 DIAGNOSIS — Z91199 Patient's noncompliance with other medical treatment and regimen due to unspecified reason: Secondary | ICD-10-CM | POA: Diagnosis not present

## 2022-10-22 DIAGNOSIS — M25552 Pain in left hip: Secondary | ICD-10-CM | POA: Diagnosis present

## 2022-10-22 LAB — BASIC METABOLIC PANEL
Anion gap: 7 (ref 5–15)
BUN: 53 mg/dL — ABNORMAL HIGH (ref 8–23)
CO2: 18 mmol/L — ABNORMAL LOW (ref 22–32)
Calcium: 7.8 mg/dL — ABNORMAL LOW (ref 8.9–10.3)
Chloride: 114 mmol/L — ABNORMAL HIGH (ref 98–111)
Creatinine, Ser: 3.55 mg/dL — ABNORMAL HIGH (ref 0.61–1.24)
GFR, Estimated: 17 mL/min — ABNORMAL LOW (ref >60)
Glucose, Bld: 130 mg/dL — ABNORMAL HIGH (ref 70–99)
Potassium: 3.9 mmol/L (ref 3.5–5.1)
Sodium: 139 mmol/L (ref 135–145)

## 2022-10-22 LAB — IRON AND TIBC
Iron: 65 ug/dL (ref 45–182)
Saturation Ratios: 31 % (ref 17.9–39.5)
TIBC: 209 ug/dL — ABNORMAL LOW (ref 250–450)
UIBC: 144 ug/dL

## 2022-10-22 LAB — CBC
HCT: 23 % — ABNORMAL LOW (ref 39.0–52.0)
Hemoglobin: 7.3 g/dL — ABNORMAL LOW (ref 13.0–17.0)
MCH: 28.1 pg (ref 26.0–34.0)
MCHC: 31.7 g/dL (ref 30.0–36.0)
MCV: 88.5 fL (ref 80.0–100.0)
Platelets: 100 10*3/uL — ABNORMAL LOW (ref 150–400)
RBC: 2.6 MIL/uL — ABNORMAL LOW (ref 4.22–5.81)
RDW: 13.9 % (ref 11.5–15.5)
WBC: 3.6 10*3/uL — ABNORMAL LOW (ref 4.0–10.5)
nRBC: 0 % (ref 0.0–0.2)

## 2022-10-22 LAB — RETICULOCYTES
Immature Retic Fract: 5.9 % (ref 2.3–15.9)
RBC.: 2.6 MIL/uL — ABNORMAL LOW (ref 4.22–5.81)
Retic Count, Absolute: 34.8 10*3/uL (ref 19.0–186.0)
Retic Ct Pct: 1.3 % (ref 0.4–3.1)

## 2022-10-22 LAB — CK: Total CK: 752 U/L — ABNORMAL HIGH (ref 49–397)

## 2022-10-22 MED ORDER — SODIUM CHLORIDE 0.9 % IV SOLN: INTRAVENOUS | Status: DC

## 2022-10-22 MED ORDER — OXYCODONE HCL 5 MG PO TABS
5.0000 mg | ORAL_TABLET | Freq: Three times a day (TID) | ORAL | Status: DC | PRN
  Administered 2022-10-22 – 2022-10-24 (×5): 5 mg via ORAL
  Filled 2022-10-22 (×7): qty 1

## 2022-10-22 NOTE — Evaluation (Addendum)
Physical Therapy Evaluation Patient Details Name: Michael [name] MRN: [id number] DOB: 1951-03-10 Today's Date: 10/22/2022  History of Present Illness  72 y.o. male presented to ED 1/15 with mechanical fall and worsening of back pain and left hip pain. Recently hospitalized 11/30-12/1/23 and again from 12/4-1/12. PMHx:HTN, chronic HFrEF with recovered LVEF 60 to 65%, PAF on Eliquis, CAD s/p CABG and stenting, DM, CKD stage IIIb, HIV on HAART, and cocaine abuse.  Clinical Impression  Pt admitted with above diagnosis. Familiar to our services from multiple admissions, previously in [place] program. Pt states he fell at a friend's home after d/c using a SPC because the house was cluttered. (Notes indicate pt was brought to hospital by EMS from police department.) He denied HHPT services at d/c. Cannot return to friend's home. Prefers d/c to a motel with upright walker which he states will fit in motel room. Poor decisions and drug use likely resulting in frequent admissions and falls. Would consider d/c with upright walker if pt decides to go to motel, HHPT will be needed but likely remain a frequent flyer if pt continues to make poor decisions. For this reason SNF is currently recommended to help progress functional mobility and would benefit from substance abuse counseling.  Pt currently with functional limitations due to the deficits listed below (see PT Problem List). Pt will benefit from skilled PT to increase their independence and safety with mobility to allow discharge to the venue listed below.       Pt states he did not receive upright rollator at d/c, however friend's house was too cluttered to use anyway.   Recommendations for follow up therapy are one component of a multi-disciplinary discharge planning process, led by the attending physician.  Recommendations may be updated based on patient status, additional functional criteria and insurance authorization.  Follow Up Recommendations Skilled  nursing-short term rehab (<3 hours/day) (If not a candidate for SNF, pt will need first floor, handicap accessible hotel room) Can patient physically be transported by private vehicle: Yes    Assistance Recommended at Discharge PRN  Patient can return home with the following  Direct supervision/assist for medications management;Direct supervision/assist for financial management;Assist for transportation;Assistance with cooking/housework    Equipment Recommendations Other (comment) (upright Rollator)  Recommendations for Other Services       Functional Status Assessment       Precautions / Restrictions Precautions Precautions: Fall Restrictions Weight Bearing Restrictions: No      Mobility  Bed Mobility Overal bed mobility: Modified Independent             General bed mobility comments: extra time    Transfers Overall transfer level: Needs assistance Equipment used: Straight cane Transfers: Sit to/from Stand Sit to Stand: Supervision           General transfer comment: supervision for safety    Ambulation/Gait Ambulation/Gait assistance: Min guard Gait Distance (Feet): 70 Feet Assistive device: Straight cane Gait Pattern/deviations: Step-through pattern, Decreased step length - right, Decreased step length - left, Trunk flexed, Narrow base of support, Antalgic Gait velocity: slowed Gait velocity interpretation: <1.31 ft/sec, indicative of household ambulator   General Gait Details: using cane in R hand, antalgic gait pattern with reduced weight on LLE. No overt buckling noted, slightly unstable but able to self correct.  Stairs            Wheelchair Mobility    Modified Rankin (Stroke Patients Only)       Balance Overall balance assessment: Needs assistance  Sitting-balance support: No upper extremity supported, Feet supported Sitting balance-Leahy Scale: Good     Standing balance support: No upper extremity supported, During functional  activity Standing balance-Leahy Scale: Fair Standing balance comment: More steady with SPC use                             Pertinent Vitals/Pain Pain Assessment Pain Assessment: Faces Faces Pain Scale: Hurts a little bit Pain Location: Lt hip Pain Descriptors / Indicators: Sharp Pain Intervention(s): Monitored during session, Repositioned    Home Living Family/patient expects to be discharged to:: Other (Comment) (Motel) Living Arrangements: Alone Available Help at Discharge:  (none) Type of Home: Homeless (Stays in hotels depending on expenses)           Home Equipment: [name] - single point Additional Comments: Would like housing resources.    Prior Function Prior Level of Function : History of Falls (last six months);Needs assist             Mobility Comments: Using SPC at friends house, very congested home, tripped on furniture.       Hand Dominance   Dominant Hand: Right    Extremity/Trunk Assessment   Upper Extremity Assessment Upper Extremity Assessment: Defer to OT evaluation    Lower Extremity Assessment Lower Extremity Assessment: LLE deficits/detail LLE Deficits / Details: TTP Lt lateral thigh and glute    Cervical / Trunk Assessment Cervical / Trunk Assessment: Normal  Communication   Communication: No difficulties  Cognition Arousal/Alertness: Awake/alert Behavior During Therapy: WFL for tasks assessed/performed Overall Cognitive Status: Within Functional Limits for tasks assessed                                 General Comments: Provides information regarding fall. States he was at a friend's house and fell - notes indicate he was brought in by EMS from police department.        General Comments      Exercises     Assessment/Plan    PT Assessment Patient needs continued PT services  PT Problem List Decreased strength;Decreased activity tolerance;Decreased balance;Decreased mobility;Decreased knowledge of  use of DME;Impaired sensation;Decreased range of motion;Decreased coordination;Pain       PT Treatment Interventions DME instruction;Gait training;Functional mobility training;Therapeutic activities;Therapeutic exercise;Balance training;Neuromuscular re-education;Patient/family education;Stair training    PT Goals (Current goals can be found in the Care Plan section)  Acute Rehab PT Goals Patient Stated Goal: Go to a hotel, or rehab PT Goal Formulation: With patient Time For Goal Achievement: 11/05/22 Potential to Achieve Goals: Good    Frequency Min 3X/week     Co-evaluation               AM-PAC PT "6 Clicks" Mobility  Outcome Measure Help needed turning from your back to your side while in a flat bed without using bedrails?: None Help needed moving from lying on your back to sitting on the side of a flat bed without using bedrails?: None Help needed moving to and from a bed to a chair (including a wheelchair)?: None Help needed standing up from a chair using your arms (e.g., wheelchair or bedside chair)?: None Help needed to walk in hospital room?: A Little Help needed climbing 3-5 steps with a railing? : A Little 6 Click Score: 22    End of Session Equipment Utilized During Treatment: Gait belt Activity Tolerance: Patient tolerated  treatment well Patient left: in bed;with call bell/phone within reach;with bed alarm set Nurse Communication: Mobility status PT Visit Diagnosis: Unsteadiness on feet (R26.81);Other abnormalities of gait and mobility (R26.89);History of falling (Z91.81);Muscle weakness (generalized) (M62.81);Difficulty in walking, not elsewhere classified (R26.2)    Time: 0903-0149 PT Time Calculation (min) (ACUTE ONLY): 24 min   Charges:   PT Evaluation $PT Eval Low Complexity: 1 Low PT Treatments $Gait Training: 8-22 mins        [name], PT, DPT Physical Therapist [facility] [place]   Ellouise Newer 10/22/2022, 11:54 AM

## 2022-10-22 NOTE — ED Notes (Signed)
ED TO INPATIENT HANDOFF REPORT  ED Nurse Name and Phone #: Tarisa Paola [phone]  S Name/Age/Gender Michael [name] 72 y.o. male Room/Bed: 043C/043C  Code Status   Code Status: Full Code  Home/SNF/Other Home Patient oriented to: self, place, time, and situation Is this baseline? Yes   Triage Complete: Triage complete  Chief Complaint AKI (acute kidney injury) [facility]) [N17.9]  Triage Note [place] [place] [facility] c/o generalized pain that has been there since he was seen on Friday.   Allergies No Known Allergies  Level of Care/Admitting Diagnosis ED Disposition     ED Disposition  Admit   Condition  --   Comment  Hospital Area: [facility] [[id number]]  Level of Care: Telemetry Medical [104]  May place patient in observation at [facility] or [place] if equivalent level of care is available:: No  Covid Evaluation: Asymptomatic - no recent exposure (last 10 days) testing not required  Diagnosis: AKI (acute kidney injury) [facility]) [[id number]]  Admitting Physician: Lequita Halt [[id number]]  Attending Physician: Lequita Halt [[id number]]          B Medical/Surgery History Past Medical History:  Diagnosis Date   A-fib ([place])    Anemia    Asthma    No PFTs, history of childhood asthma   CAD (coronary artery disease)    Cellulitis 04/2014   left facial   Cellulitis and abscess of toe of right foot 12/08/2019   Chondromalacia of medial femoral condyle    Left knee MRI 04/28/12: Chondromalacia of the medial femoral condyle with slight peripheral degeneration of the meniscocapsular junction of the medial meniscus; followed by sports medicine   Chronic kidney failure, stage 4 (severe) ([place])    Collagen vascular disease ([place])    Crack cocaine use    for 20+ years, has been enrolled in detox programs in the past   Depression    with history of hospitalization for suicidal ideation   Diabetes mellitus 2002   Diagnosed in 2002, started insulin in 2012    Gout    Headache(784.0)    CT head 08/2011: Periventricular and subcortical white matter hypodensities are most in keeping with chronic microangiopathic change   HIV infection ([place]) 08/2011   Followed by [name]   Hyperlipidemia    Hypertension    Pulmonary embolism [facility])    Past Surgical History:  Procedure Laterality Date   AMPUTATION Right 07/21/2019   Procedure: RIGHT SECOND TOE AMPUTATION;  Surgeon: [name], MD;  Location: [place];  Service: Orthopedics;  Laterality: Right;   BACK SURGERY     1988   BOWEL RESECTION     CARDIAC SURGERY     CERVICAL SPINE SURGERY     " rods in my neck "   CORONARY ARTERY BYPASS GRAFT     CORONARY STENT PLACEMENT     NM MYOCAR PERF WALL MOTION  12/27/2011   normal   SPINE SURGERY       A IV Location/Drains/Wounds Patient Lines/Drains/Airways Status     Active Line/Drains/Airways     Name Placement date Placement time Site Days   Peripheral IV 10/21/22 20 G Anterior;Distal;Right Forearm 10/21/22  1159  Forearm  1            Intake/Output Last 24 hours  Intake/Output Summary (Last 24 hours) at 10/22/2022 1353 Last data filed at 10/22/2022 0112 Gross per 24 hour  Intake 1500 ml  Output --  Net 1500 ml  Labs/Imaging Results for orders placed or performed during the hospital encounter of 10/21/22 (from the past 48 hour(s))  CBC with Differential     Status: Abnormal   Collection Time: 10/21/22  6:40 AM  Result Value Ref Range   WBC 4.6 4.0 - 10.5 K/uL   RBC 2.91 (L) 4.22 - 5.81 MIL/uL   Hemoglobin 8.4 (L) 13.0 - 17.0 g/dL   HCT 25.7 (L) 39.0 - 52.0 %   MCV 88.3 80.0 - 100.0 fL   MCH 28.9 26.0 - 34.0 pg   MCHC 32.7 30.0 - 36.0 g/dL   RDW 13.9 11.5 - 15.5 %   Platelets 125 (L) 150 - 400 K/uL   nRBC 0.0 0.0 - 0.2 %   Neutrophils Relative % 70 %   Neutro Abs 3.2 1.7 - 7.7 K/uL   Lymphocytes Relative 20 %   Lymphs Abs 0.9 0.7 - 4.0 K/uL   Monocytes Relative 7 %   Monocytes Absolute 0.3 0.1 - 1.0 K/uL    Eosinophils Relative 2 %   Eosinophils Absolute 0.1 0.0 - 0.5 K/uL   Basophils Relative 1 %   Basophils Absolute 0.0 0.0 - 0.1 K/uL   Immature Granulocytes 0 %   Abs Immature Granulocytes 0.02 0.00 - 0.07 K/uL    Comment: Performed at [facility], [address] [place], [place] [address]  Basic metabolic panel     Status: Abnormal   Collection Time: 10/21/22  6:40 AM  Result Value Ref Range   Sodium 139 135 - 145 mmol/L   Potassium 4.0 3.5 - 5.1 mmol/L   Chloride 111 98 - 111 mmol/L   CO2 20 (L) 22 - 32 mmol/L   Glucose, Bld 117 (H) 70 - 99 mg/dL    Comment: Glucose reference range applies only to samples taken after fasting for at least 8 hours.   BUN 58 (H) 8 - 23 mg/dL   Creatinine, Ser 4.37 (H) 0.61 - 1.24 mg/dL   Calcium 8.7 (L) 8.9 - 10.3 mg/dL   GFR, Estimated 14 (L) >60 mL/min    Comment: (NOTE) Calculated using the CKD-EPI Creatinine Equation (2021)    Anion gap 8 5 - 15    Comment: Performed at [facility] [address] [place], [place] [address]  CK     Status: Abnormal   Collection Time: 10/21/22  6:40 AM  Result Value Ref Range   Total CK 1,916 (H) 49 - 397 U/L    Comment: Performed at [facility], [place] [address], [place], [place] [address]  Troponin I (High Sensitivity)     Status: Abnormal   Collection Time: 10/21/22 11:27 AM  Result Value Ref Range   Troponin I (High Sensitivity) 22 (H) <18 ng/L    Comment: (NOTE) Elevated high sensitivity troponin I (hsTnI) values and significant  changes across serial measurements may suggest ACS but many other  chronic and acute conditions are known to elevate hsTnI results.  Refer to the "Links" section for chest pain algorithms and additional  guidance. Performed at [facility], [place] [address], [place], [place] [address]   D-dimer, quantitative     Status: Abnormal   Collection Time: 10/21/22 11:27 AM  Result Value Ref Range   D-Dimer, Quant 0.65 (H) 0.00 - 0.50 ug/mL-FEU    Comment:  (NOTE) At the manufacturer cut-off value of 0.5 g/mL FEU, this assay has a negative predictive value of 95-100%.This assay is intended for use in  conjunction with a clinical pretest probability (PTP) assessment model to exclude pulmonary embolism (PE) and deep venous thrombosis (DVT) in outpatients suspected of PE or DVT. Results should be correlated with clinical presentation. Performed at [facility], [place] [address], [place], [place] [address]   Lipase, blood     Status: Abnormal   Collection Time: 10/21/22 11:27 AM  Result Value Ref Range   Lipase 64 (H) 11 - 51 U/L    Comment: Performed at [facility] [address], [place], [place] [address]  Hepatic function panel     Status: Abnormal   Collection Time: 10/21/22 11:27 AM  Result Value Ref Range   Total Protein 6.7 6.5 - 8.1 g/dL   Albumin 3.8 3.5 - 5.0 g/dL   AST 54 (H) 15 - 41 U/L   ALT 51 (H) 0 - 44 U/L   Alkaline Phosphatase 60 38 - 126 U/L   Total Bilirubin 0.5 0.3 - 1.2 mg/dL   Bilirubin, Direct <0.1 0.0 - 0.2 mg/dL   Indirect Bilirubin NOT CALCULATED 0.3 - 0.9 mg/dL    Comment: Performed at [facility] [address], [place], [place] [address]  Resp panel by RT-PCR (RSV, Flu A&B, Covid) Anterior Nasal Swab     Status: None   Collection Time: 10/21/22 11:42 AM   Specimen: Anterior Nasal Swab  Result Value Ref Range   SARS Coronavirus 2 by RT PCR NEGATIVE NEGATIVE    Comment: (NOTE) SARS-CoV-2 target nucleic acids are NOT DETECTED.  The SARS-CoV-2 RNA is generally detectable in upper respiratory specimens during the acute phase of infection. The lowest concentration of SARS-CoV-2 viral copies this assay can detect is 138 copies/mL. A negative result does not preclude SARS-Cov-2 infection and should not be used as the sole basis for treatment or other patient management decisions. A negative result may occur with  improper specimen collection/handling, submission of specimen other than  nasopharyngeal swab, presence of viral mutation(s) within the areas targeted by this assay, and inadequate number of viral copies(<138 copies/mL). A negative result must be combined with clinical observations, patient history, and epidemiological information. The expected result is Negative.  Fact Sheet for Patients:  EntrepreneurPulse.com.au  Fact Sheet for Healthcare Providers:  IncredibleEmployment.be  This test is no t yet approved or cleared by the [place] FDA and  has been authorized for detection and/or diagnosis of SARS-CoV-2 by FDA under an Emergency Use Authorization (EUA). This EUA will remain  in effect (meaning this test can be used) for the duration of the COVID-19 declaration under Section 564(b)(1) of the Act, 21 U.S.C.section 360bbb-3(b)(1), unless the authorization is terminated  or revoked sooner.       Influenza A by PCR NEGATIVE NEGATIVE   Influenza B by PCR NEGATIVE NEGATIVE    Comment: (NOTE) The Xpert Xpress SARS-CoV-2/FLU/RSV plus assay is intended as an aid in the diagnosis of influenza from Nasopharyngeal swab specimens and should not be used as a sole basis for treatment. Nasal washings and aspirates are unacceptable for Xpert Xpress SARS-CoV-2/FLU/RSV testing.  Fact Sheet for Patients: EntrepreneurPulse.com.au  Fact Sheet for Healthcare Providers: IncredibleEmployment.be  This test is not yet approved or cleared by the [place] FDA and has been authorized for detection and/or diagnosis of SARS-CoV-2 by FDA under an Emergency Use Authorization (EUA). This EUA will remain in effect (meaning this test can be used) for the duration of the COVID-19 declaration under Section 564(b)(1) of the Act, 21 U.S.C. section  360bbb-3(b)(1), unless the authorization is terminated or revoked.     Resp Syncytial Virus by PCR NEGATIVE NEGATIVE    Comment: (NOTE) Fact Sheet for  Patients: EntrepreneurPulse.com.au  Fact Sheet for Healthcare Providers: IncredibleEmployment.be  This test is not yet approved or cleared by the [place] FDA and has been authorized for detection and/or diagnosis of SARS-CoV-2 by FDA under an Emergency Use Authorization (EUA). This EUA will remain in effect (meaning this test can be used) for the duration of the COVID-19 declaration under Section 564(b)(1) of the Act, 21 U.S.C. section 360bbb-3(b)(1), unless the authorization is terminated or revoked.  Performed at [facility], [place] [address], [place], [place] [address]   Lactic acid, plasma     Status: None   Collection Time: 10/21/22 11:55 AM  Result Value Ref Range   Lactic Acid, Venous 0.8 0.5 - 1.9 mmol/L    Comment: Performed at [facility] [address] [place], [place] [address]  Rapid urine drug screen (hospital performed)     Status: Abnormal   Collection Time: 10/21/22 12:40 PM  Result Value Ref Range   Opiates NONE DETECTED NONE DETECTED   Cocaine POSITIVE (A) NONE DETECTED   Benzodiazepines NONE DETECTED NONE DETECTED   Amphetamines NONE DETECTED NONE DETECTED   Tetrahydrocannabinol NONE DETECTED NONE DETECTED   Barbiturates NONE DETECTED NONE DETECTED    Comment: (NOTE) DRUG SCREEN FOR MEDICAL PURPOSES ONLY.  IF CONFIRMATION IS NEEDED FOR ANY PURPOSE, NOTIFY LAB WITHIN 5 DAYS.  LOWEST DETECTABLE LIMITS FOR URINE DRUG SCREEN Drug Class                     Cutoff (ng/mL) Amphetamine and metabolites    1000 Barbiturate and metabolites    200 Benzodiazepine                 200 Opiates and metabolites        300 Cocaine and metabolites        300 THC                            50 Performed at [facility], [place] [address], [place], [place] [address]   Troponin I (High Sensitivity)     Status: None   Collection Time: 10/21/22  1:15 PM  Result Value Ref Range   Troponin I (High Sensitivity) 14  <18 ng/L    Comment: (NOTE) Elevated high sensitivity troponin I (hsTnI) values and significant  changes across serial measurements may suggest ACS but many other  chronic and acute conditions are known to elevate hsTnI results.  Refer to the "Links" section for chest pain algorithms and additional  guidance. Performed at [facility], [place] [address] [place], [place] [address]   Urinalysis, Routine w reflex microscopic Urine, Clean Catch     Status: Abnormal   Collection Time: 10/21/22  1:18 PM  Result Value Ref Range   Color, Urine YELLOW YELLOW   APPearance CLEAR CLEAR   Specific Gravity, Urine 1.015 1.005 - 1.030   pH 5.0 5.0 - 8.0   Glucose, UA NEGATIVE NEGATIVE mg/dL   Hgb urine dipstick SMALL (A) NEGATIVE   Bilirubin Urine NEGATIVE NEGATIVE   Ketones, ur NEGATIVE NEGATIVE mg/dL   Protein, ur 100 (A) NEGATIVE mg/dL   Nitrite NEGATIVE NEGATIVE   Leukocytes,Ua NEGATIVE NEGATIVE   RBC / HPF 0-5 0 - 5 RBC/hpf  WBC, UA 0-5 0 - 5 WBC/hpf   Bacteria, UA NONE SEEN NONE SEEN   Squamous Epithelial / HPF 0-5 0 - 5 /HPF   Mucus PRESENT    Hyaline Casts, UA PRESENT    Granular Casts, UA PRESENT     Comment: Performed at [facility], [place] [address] [place], [place] [address]  CBC     Status: Abnormal   Collection Time: 10/22/22  4:56 AM  Result Value Ref Range   WBC 3.6 (L) 4.0 - 10.5 K/uL   RBC 2.60 (L) 4.22 - 5.81 MIL/uL   Hemoglobin 7.3 (L) 13.0 - 17.0 g/dL   HCT 23.0 (L) 39.0 - 52.0 %   MCV 88.5 80.0 - 100.0 fL   MCH 28.1 26.0 - 34.0 pg   MCHC 31.7 30.0 - 36.0 g/dL   RDW 13.9 11.5 - 15.5 %   Platelets 100 (L) 150 - 400 K/uL   nRBC 0.0 0.0 - 0.2 %    Comment: Performed at [facility], [place] [address], [place], [place] [address]  Basic metabolic panel     Status: Abnormal   Collection Time: 10/22/22  4:56 AM  Result Value Ref Range   Sodium 139 135 - 145 mmol/L   Potassium 3.9 3.5 - 5.1 mmol/L   Chloride 114 (H) 98 - 111 mmol/L   CO2 18 (L) 22 - 32  mmol/L   Glucose, Bld 130 (H) 70 - 99 mg/dL    Comment: Glucose reference range applies only to samples taken after fasting for at least 8 hours.   BUN 53 (H) 8 - 23 mg/dL   Creatinine, Ser 3.55 (H) 0.61 - 1.24 mg/dL   Calcium 7.8 (L) 8.9 - 10.3 mg/dL   GFR, Estimated 17 (L) >60 mL/min    Comment: (NOTE) Calculated using the CKD-EPI Creatinine Equation (2021)    Anion gap 7 5 - 15    Comment: Performed at [facility] [address], [place], [place] [address]  CK     Status: Abnormal   Collection Time: 10/22/22  4:56 AM  Result Value Ref Range   Total CK 752 (H) 49 - 397 U/L    Comment: Performed at [facility], [place] [address] [place], [place] [address]   CT HIP LEFT WO CONTRAST  Result Date: 10/21/2022 CLINICAL DATA:  [place] 2 days ago. Left hip pain. EXAM: CT OF THE LEFT HIP WITHOUT CONTRAST TECHNIQUE: Multidetector CT imaging of the left hip was performed according to the standard protocol. Multiplanar CT image reconstructions were also generated. RADIATION DOSE REDUCTION: This exam was performed according to the departmental dose-optimization program which includes automated exposure control, adjustment of the mA and/or kV according to patient size and/or use of iterative reconstruction technique. COMPARISON:  CT scan 02/14/2022 FINDINGS: The left hip is normally located. Severe left hip joint degenerative changes with marked joint space narrowing, osteophytic spurring, bony eburnation, subchondral cystic change and loose ossified bodies. No acute fracture or evidence of AVN. Large stable os acetabuli. The pubic symphysis and SI joints are intact. No pelvic fractures are identified. The left hip and pelvic musculature are grossly normal by CT. No obvious large intramuscular hematoma. No subcutaneous hematoma. No significant intrapelvic abnormalities are identified. IMPRESSION: Severe left hip joint degenerative changes but no acute fracture or evidence of AVN. Intact visualized  bony pelvis. Electronically Signed   By: Marijo Sanes M.D.   On: 10/21/2022 17:43   VAS [place]  LOWER EXTREMITY VENOUS (DVT) (ONLY [facility] & [facility])  Result Date: 10/21/2022  Lower Venous DVT Study Patient Name:  Michael [name]  Date of Exam:   10/21/2022 Medical Rec #: [id number]            Accession #:    [id number] Date of Birth: 03-22-1951            Patient Gender: M Patient Age:   72 years Exam Location:  [facility] Procedure:      VAS [place] LOWER EXTREMITY VENOUS (DVT) Referring Phys: Gareth Morgan --------------------------------------------------------------------------------  Indications: Pain.  Comparison Study: Prior negative bilateral LEV done 07/17/2019 Performing Technologist: Sharion Dove RVS  Examination Guidelines: A complete evaluation includes B-mode imaging, spectral Doppler, color Doppler, and power Doppler as needed of all accessible portions of each vessel. Bilateral testing is considered an integral part of a complete examination. Limited examinations for reoccurring indications may be performed as noted. The reflux portion of the exam is performed with the patient in reverse Trendelenburg.  +-----+---------------+---------+-----------+----------+--------------+ RIGHTCompressibilityPhasicitySpontaneityPropertiesThrombus Aging +-----+---------------+---------+-----------+----------+--------------+ CFV  Full           Yes      Yes                                 +-----+---------------+---------+-----------+----------+--------------+   +---------+---------------+---------+-----------+----------+--------------+ LEFT     CompressibilityPhasicitySpontaneityPropertiesThrombus Aging +---------+---------------+---------+-----------+----------+--------------+ CFV      Full           Yes      Yes                                 +---------+---------------+---------+-----------+----------+--------------+ SFJ      Full                                                         +---------+---------------+---------+-----------+----------+--------------+ FV Prox  Full                                                        +---------+---------------+---------+-----------+----------+--------------+ FV Mid   Full                                                        +---------+---------------+---------+-----------+----------+--------------+ FV DistalFull                                                        +---------+---------------+---------+-----------+----------+--------------+ PFV      Full                                                        +---------+---------------+---------+-----------+----------+--------------+  POP      Full           Yes      Yes                                 +---------+---------------+---------+-----------+----------+--------------+ PTV      Full                                                        +---------+---------------+---------+-----------+----------+--------------+ PERO     Full                                                        +---------+---------------+---------+-----------+----------+--------------+     Summary: RIGHT: - No evidence of common femoral vein obstruction.  LEFT: - There is no evidence of deep vein thrombosis in the lower extremity.  - No cystic structure found in the popliteal fossa.  *See table(s) above for measurements and observations. Electronically signed by Harold Barban MD on 10/21/2022 at 5:38:49 PM.    Final    DG HIP UNILAT WITH PELVIS 2-3 VIEWS LEFT  Result Date: 10/21/2022 CLINICAL DATA:  Patient fell on left hip 2 days ago EXAM: DG HIP (WITH OR WITHOUT PELVIS) 3V LEFT COMPARISON:  12/16/2016 x-ray.  CT abdomen pelvis 10/13/2022 FINDINGS: There is a question of disorganized trabecula along the femoral neck on one view. This could be technical though if there is concern specifically of the hip fracture a CT or MRI can be performed to exclude a nondisplaced  injury. Otherwise no acute fracture or dislocation. Moderate joint space loss of the left hip with osteophytes. Mild on the right. Hyperostosis. Vascular calcifications are seen in the pelvis. There is a focal density in the tissues adjacent to the left greater trochanter. This is of uncertain etiology and was not present on the prior CT scan. Please correlate for artifact. IMPRESSION: Degenerative changes seen greatest of the left hip. On one view there is some subtle trabecular changes along the femoral neck. This could be technical rather than a true injury. If there is concern of fracture a CT or MRI may be of benefit to confirm a nondisplaced injury. Possible overlapping artifact or structure with a rounded density overlying the left hip. Please correlate with clinical findings. Electronically Signed   By: Jill Side M.D.   On: 10/21/2022 15:32   DG Lumbar Spine Complete  Result Date: 10/21/2022 CLINICAL DATA:  Chest pain and low back pain for 1 day. EXAM: LUMBAR SPINE - COMPLETE 4+ VIEW COMPARISON:  September 26, 2022 FINDINGS: There is no evidence of lumbar spine fracture. Grade 1 anterolisthesis of L4 on L5 is identified unchanged. Postsurgical changes in the posterior sacrum unchanged. Mild degenerative joint changes with anterior spurring, narrow intervertebral space is identified. IMPRESSION: No acute fracture or dislocation identified in the lumbar spine. Degenerative joint changes. Electronically Signed   By: Abelardo Diesel M.D.   On: 10/21/2022 13:14   DG Chest 2 View  Result Date: 10/21/2022 CLINICAL DATA:  Chest pain and low back pain for 1 day. EXAM: CHEST - 2 VIEW COMPARISON:  September 23, 2022 FINDINGS: The heart size and mediastinal contours are stable. Both lungs are clear. The visualized skeletal structures are stable. IMPRESSION: No active cardiopulmonary disease. Electronically Signed   By: Abelardo Diesel M.D.   On: 10/21/2022 13:12    Pending Labs Unresulted Labs (From admission,  onward)     Start     Ordered   10/21/22 1507  Iron and TIBC  Add-on,   AD        10/21/22 1506   10/21/22 1507  Reticulocytes  Add-on,   AD        10/21/22 1506   10/21/22 1323  Microalbumin / creatinine urine ratio  Once,   URGENT        10/21/22 1323            Vitals/Pain Today's Vitals   10/22/22 0800 10/22/22 1000 10/22/22 1100 10/22/22 1232  BP: 124/83 115/65 122/75   Pulse: 69 75 70   Resp: '15 16 15   '$ Temp: 98.4 F (36.9 C)   98.3 F (36.8 C)  TempSrc: Oral   Oral  SpO2: 100% 100% 100%   Weight:      Height:      PainSc: 7        Isolation Precautions Airborne and Contact precautions  Medications Medications  aspirin chewable tablet 81 mg (81 mg Oral Given 10/22/22 0953)  diltiazem (CARDIZEM CD) 24 hr capsule 120 mg (120 mg Oral Given 10/22/22 0954)  hydrALAZINE (APRESOLINE) tablet 50 mg (50 mg Oral Given 10/22/22 1318)  isosorbide mononitrate (IMDUR) 24 hr tablet 15 mg (15 mg Oral Given 10/22/22 0954)  FLUoxetine (PROZAC) capsule 20 mg (20 mg Oral Given 10/22/22 0955)  meclizine (ANTIVERT) tablet 25 mg (has no administration in time range)  polyethylene glycol (MIRALAX / GLYCOLAX) packet 17 g (17 g Oral Not Given 10/22/22 0956)  sodium bicarbonate tablet 325 mg (325 mg Oral Given 10/22/22 0956)  clopidogrel (PLAVIX) tablet 75 mg (75 mg Oral Given 10/22/22 0955)  ferrous sulfate tablet 325 mg (325 mg Oral Given 10/22/22 0808)  gabapentin (NEURONTIN) capsule 300 mg (300 mg Oral Given 10/21/22 2236)  albuterol (PROVENTIL) (2.5 MG/3ML) 0.083% nebulizer solution 2.5 mg (has no administration in time range)  mometasone-formoterol (DULERA) 200-5 MCG/ACT inhaler 2 puff (2 puffs Inhalation Given 10/22/22 0809)  umeclidinium bromide (INCRUSE ELLIPTA) 62.5 MCG/ACT 1 puff (1 puff Inhalation Given 10/22/22 0959)  heparin injection 5,000 Units (5,000 Units Subcutaneous Given 10/22/22 0956)  acetaminophen (TYLENOL) tablet 650 mg (has no administration in time range)    Or   acetaminophen (TYLENOL) suppository 650 mg (has no administration in time range)  bisacodyl (DULCOLAX) EC tablet 5 mg (has no administration in time range)  dolutegravir (TIVICAY) tablet 50 mg (50 mg Oral Given 10/22/22 0811)  rilpivirine (EDURANT) tablet 25 mg (25 mg Oral Given 10/22/22 0811)  oxyCODONE (Oxy IR/ROXICODONE) immediate release tablet 5 mg (5 mg Oral Given 10/22/22 0817)  0.9 %  sodium chloride infusion ( Intravenous New Bag/Given 10/22/22 0827)  lactated ringers bolus 500 mL (0 mLs Intravenous Stopped 10/21/22 1242)  lactated ringers bolus 500 mL (0 mLs Intravenous Stopped 10/21/22 1600)    Mobility walks with device Low fall risk   Focused Assessments    R Recommendations: See Admitting Provider Note  Report given to:   Additional Notes:

## 2022-10-22 NOTE — ED Notes (Signed)
Pt in room A&O x4 resting. Breakfast tray set up. Pt tolerated pills. Updated on plan if care. Pt c/o Back pain.

## 2022-10-22 NOTE — Progress Notes (Addendum)
PROGRESS NOTE    Michael [name]  CBJ:[id number] DOB: 06-09-1951 DOA: 10/21/2022 PCP: [name], MD   Brief Narrative: 72 year old with past medical history significant for chronic preserved heart failure, PAF, CAD status post CABG/PCI, CKD stage IV, HIV on [place]noria [place], cocaine abuse presents after a mechanical fall and complaining of left hip pain.  Patient fell 2 days prior to admission, he reports limping of the left leg.  He is complaining of worsening left hip pain.  He report decreased oral intake.  Patient also started to use cocaine again.   Of note patient was recently hospitalized for 37 days, for AKI, suicidal ideation subsequently cleared by psych, back pain, debility.  He underwent the STAR program in the hospital.  He was discharged to a friend house. He used Cocaine after discharge.    Assessment & Plan:   Principal Problem:   AKI (acute kidney injury) ([place]) Active Problems:   ARF (acute renal failure) ([name])   (HFpEF) heart failure with preserved ejection fraction ([name])   Back pain   CKD (chronic kidney disease), stage IV ([place])  1-AKI on CKD stage IV Correction  from HPI patient has a stage IV known stage III CKD Worsening kidney function in the setting of hypovolemia, drug use, mild elevation in CK in the setting of fall. Continue with IV fluids Follow CK trend Prior creatinine at discharge 3.2, this is probably his new baseline  2-Left hip pain, acute on chronic ambulation dysfunction CT left hip: Severe left hip joint degenerative changes but no acute fracture or evidence of AVN.  3-Cocaine abuse: After discharge he is started to use cocaine again Difficult to place at a rehab for drug due to ambulatory dysfunction Counseling.   4-Paroxysmal A-fib: Continue with Cardizem Not on anticoagulation due to history of severe bleeding  5-Chronic normocytic anemia: Related to CKD Monitor  hemoglobin  6-HIV: On [place] Diabetes type 2 last A1c 7.7 Sliding scale  insulin  COPD: Continue with Incruse     Estimated body mass index is 25.48 kg/m as calculated from the following:   Height as of this encounter: '5\' 8"'$  (1.727 m).   Weight as of this encounter: 76 kg.   DVT prophylaxis: Heparin  Code Status: Full code Family Communication: Care discussed with patient.  Disposition Plan:  Status is: Inpatient Remains inpatient appropriate because: management of AKI, PT evaluation.     Consultants:  None  Procedures:  None  Antimicrobials:    Subjective: He complaints of left hip pain. He went out and use drugs again.    Objective: Vitals:   10/22/22 0800 10/22/22 1000 10/22/22 1100 10/22/22 1232  BP: 124/83 115/65 122/75   Pulse: 69 75 70   Resp: '15 16 15   '$ Temp: 98.4 F (36.9 C)   98.3 F (36.8 C)  TempSrc: Oral   Oral  SpO2: 100% 100% 100%   Weight:      Height:        Intake/Output Summary (Last 24 hours) at 10/22/2022 1507 Last data filed at 10/22/2022 0112 Gross per 24 hour  Intake 1500 ml  Output --  Net 1500 ml   Filed Weights   10/21/22 0826  Weight: 76 kg    Examination:  General exam: Appears calm and comfortable  Respiratory system: Clear to auscultation. Respiratory effort normal. Cardiovascular system: S1 & S2 heard, RRR.  Gastrointestinal system: Abdomen is nondistended, soft and nontender. No organomegaly or masses felt. Normal bowel sounds heard. Central nervous system:  Alert and oriented.  Extremities: no edema   Data Reviewed: I have personally reviewed following labs and imaging studies  CBC: Recent Labs  Lab 10/16/22 0320 10/18/22 0824 10/21/22 0640 10/22/22 0456  WBC 3.3* 3.6* 4.6 3.6*  NEUTROABS  --   --  3.2  --   HGB 7.8* 7.8* 8.4* 7.3*  HCT 23.5* 24.4* 25.7* 23.0*  MCV 88.7 89.1 88.3 88.5  PLT 102* 97* 125* 196*   Basic Metabolic Panel: Recent Labs  Lab 10/16/22 0320 10/18/22 0824 10/21/22 0640 10/22/22 0456  NA 139 140 139 139  K 3.9 4.0 4.0 3.9  CL 109 111 111  114*  CO2 23 23 20* 18*  GLUCOSE 106* 107* 117* 130*  BUN 28* 29* 58* 53*  CREATININE 3.13* 3.24* 4.37* 3.55*  CALCIUM 8.4* 8.7* 8.7* 7.8*   GFR: Estimated Creatinine Clearance: 18.2 mL/min (A) (by C-G formula based on SCr of 3.55 mg/dL (H)). Liver Function Tests: Recent Labs  Lab 10/21/22 1127  AST 54*  ALT 51*  ALKPHOS 60  BILITOT 0.5  PROT 6.7  ALBUMIN 3.8   Recent Labs  Lab 10/21/22 1127  LIPASE 64*   No results for input(s): "AMMONIA" in the last 168 hours. Coagulation Profile: No results for input(s): "INR", "PROTIME" in the last 168 hours. Cardiac Enzymes: Recent Labs  Lab 10/21/22 0640 10/22/22 0456  CKTOTAL 1,916* 752*   BNP (last 3 results) No results for input(s): "PROBNP" in the last 8760 hours. HbA1C: No results for input(s): "HGBA1C" in the last 72 hours. CBG: No results for input(s): "GLUCAP" in the last 168 hours. Lipid Profile: No results for input(s): "CHOL", "HDL", "LDLCALC", "TRIG", "CHOLHDL", "LDLDIRECT" in the last 72 hours. Thyroid Function Tests: No results for input(s): "TSH", "T4TOTAL", "FREET4", "T3FREE", "THYROIDAB" in the last 72 hours. Anemia Panel: No results for input(s): "VITAMINB12", "FOLATE", "FERRITIN", "TIBC", "IRON", "RETICCTPCT" in the last 72 hours. Sepsis Labs: Recent Labs  Lab 10/21/22 1155  LATICACIDVEN 0.8    Recent Results (from the past 240 hour(s))  Resp panel by RT-PCR (RSV, Flu A&B, Covid) Anterior Nasal Swab     Status: None   Collection Time: 10/21/22 11:42 AM   Specimen: Anterior Nasal Swab  Result Value Ref Range Status   SARS Coronavirus 2 by RT PCR NEGATIVE NEGATIVE Final    Comment: (NOTE) SARS-CoV-2 target nucleic acids are NOT DETECTED.  The SARS-CoV-2 RNA is generally detectable in upper respiratory specimens during the acute phase of infection. The lowest concentration of SARS-CoV-2 viral copies this assay can detect is 138 copies/mL. A negative result does not preclude SARS-Cov-2 infection  and should not be used as the sole basis for treatment or other patient management decisions. A negative result may occur with  improper specimen collection/handling, submission of specimen other than nasopharyngeal swab, presence of viral mutation(s) within the areas targeted by this assay, and inadequate number of viral copies(<138 copies/mL). A negative result must be combined with clinical observations, patient history, and epidemiological information. The expected result is Negative.  Fact Sheet for Patients:  EntrepreneurPulse.com.au  Fact Sheet for Healthcare Providers:  IncredibleEmployment.be  This test is no t yet approved or cleared by the [place] FDA and  has been authorized for detection and/or diagnosis of SARS-CoV-2 by FDA under an Emergency Use Authorization (EUA). This EUA will remain  in effect (meaning this test can be used) for the duration of the COVID-19 declaration under Section 564(b)(1) of the Act, 21 U.S.C.section 360bbb-3(b)(1), unless the authorization is  terminated  or revoked sooner.       Influenza A by PCR NEGATIVE NEGATIVE Final   Influenza B by PCR NEGATIVE NEGATIVE Final    Comment: (NOTE) The Xpert Xpress SARS-CoV-2/FLU/RSV plus assay is intended as an aid in the diagnosis of influenza from Nasopharyngeal swab specimens and should not be used as a sole basis for treatment. Nasal washings and aspirates are unacceptable for Xpert Xpress SARS-CoV-2/FLU/RSV testing.  Fact Sheet for Patients: EntrepreneurPulse.com.au  Fact Sheet for Healthcare Providers: IncredibleEmployment.be  This test is not yet approved or cleared by the [place] FDA and has been authorized for detection and/or diagnosis of SARS-CoV-2 by FDA under an Emergency Use Authorization (EUA). This EUA will remain in effect (meaning this test can be used) for the duration of the COVID-19 declaration  under Section 564(b)(1) of the Act, 21 U.S.C. section 360bbb-3(b)(1), unless the authorization is terminated or revoked.     Resp Syncytial Virus by PCR NEGATIVE NEGATIVE Final    Comment: (NOTE) Fact Sheet for Patients: EntrepreneurPulse.com.au  Fact Sheet for Healthcare Providers: IncredibleEmployment.be  This test is not yet approved or cleared by the [place] FDA and has been authorized for detection and/or diagnosis of SARS-CoV-2 by FDA under an Emergency Use Authorization (EUA). This EUA will remain in effect (meaning this test can be used) for the duration of the COVID-19 declaration under Section 564(b)(1) of the Act, 21 U.S.C. section 360bbb-3(b)(1), unless the authorization is terminated or revoked.  Performed at [facility], [place] [address] [place], [place] [address]          Radiology Studies: CT HIP LEFT WO CONTRAST  Result Date: 10/21/2022 CLINICAL DATA:  [place] 2 days ago. Left hip pain. EXAM: CT OF THE LEFT HIP WITHOUT CONTRAST TECHNIQUE: Multidetector CT imaging of the left hip was performed according to the standard protocol. Multiplanar CT image reconstructions were also generated. RADIATION DOSE REDUCTION: This exam was performed according to the departmental dose-optimization program which includes automated exposure control, adjustment of the mA and/or kV according to patient size and/or use of iterative reconstruction technique. COMPARISON:  CT scan 02/14/2022 FINDINGS: The left hip is normally located. Severe left hip joint degenerative changes with marked joint space narrowing, osteophytic spurring, bony eburnation, subchondral cystic change and loose ossified bodies. No acute fracture or evidence of AVN. Large stable os acetabuli. The pubic symphysis and SI joints are intact. No pelvic fractures are identified. The left hip and pelvic musculature are grossly normal by CT. No obvious large intramuscular hematoma.  No subcutaneous hematoma. No significant intrapelvic abnormalities are identified. IMPRESSION: Severe left hip joint degenerative changes but no acute fracture or evidence of AVN. Intact visualized bony pelvis. Electronically Signed   By: Marijo Sanes M.D.   On: 10/21/2022 17:43   VAS [place] LOWER EXTREMITY VENOUS (DVT) (ONLY [facility] & [facility])  Result Date: 10/21/2022  Lower Venous DVT Study Patient Name:  Michael [name]  Date of Exam:   10/21/2022 Medical Rec #: [id number]            Accession #:    [id number] Date of Birth: May 09, 1951            Patient Gender: M Patient Age:   81 years Exam Location:  [facility] Procedure:      VAS [place] LOWER EXTREMITY VENOUS (DVT) Referring Phys: Gareth Morgan --------------------------------------------------------------------------------  Indications: Pain.  Comparison Study: Prior negative bilateral LEV done 07/17/2019 Performing Technologist: Sharion Dove RVS  Examination Guidelines:  A complete evaluation includes B-mode imaging, spectral Doppler, color Doppler, and power Doppler as needed of all accessible portions of each vessel. Bilateral testing is considered an integral part of a complete examination. Limited examinations for reoccurring indications may be performed as noted. The reflux portion of the exam is performed with the patient in reverse Trendelenburg.  +-----+---------------+---------+-----------+----------+--------------+ RIGHTCompressibilityPhasicitySpontaneityPropertiesThrombus Aging +-----+---------------+---------+-----------+----------+--------------+ CFV  Full           Yes      Yes                                 +-----+---------------+---------+-----------+----------+--------------+   +---------+---------------+---------+-----------+----------+--------------+ LEFT     CompressibilityPhasicitySpontaneityPropertiesThrombus Aging +---------+---------------+---------+-----------+----------+--------------+ CFV      Full            Yes      Yes                                 +---------+---------------+---------+-----------+----------+--------------+ SFJ      Full                                                        +---------+---------------+---------+-----------+----------+--------------+ FV Prox  Full                                                        +---------+---------------+---------+-----------+----------+--------------+ FV Mid   Full                                                        +---------+---------------+---------+-----------+----------+--------------+ FV DistalFull                                                        +---------+---------------+---------+-----------+----------+--------------+ PFV      Full                                                        +---------+---------------+---------+-----------+----------+--------------+ POP      Full           Yes      Yes                                 +---------+---------------+---------+-----------+----------+--------------+ PTV      Full                                                        +---------+---------------+---------+-----------+----------+--------------+  PERO     Full                                                        +---------+---------------+---------+-----------+----------+--------------+     Summary: RIGHT: - No evidence of common femoral vein obstruction.  LEFT: - There is no evidence of deep vein thrombosis in the lower extremity.  - No cystic structure found in the popliteal fossa.  *See table(s) above for measurements and observations. Electronically signed by Harold Barban MD on 10/21/2022 at 5:38:49 PM.    Final    DG HIP UNILAT WITH PELVIS 2-3 VIEWS LEFT  Result Date: 10/21/2022 CLINICAL DATA:  Patient fell on left hip 2 days ago EXAM: DG HIP (WITH OR WITHOUT PELVIS) 3V LEFT COMPARISON:  12/16/2016 x-ray.  CT abdomen pelvis 10/13/2022 FINDINGS: There is a question of  disorganized trabecula along the femoral neck on one view. This could be technical though if there is concern specifically of the hip fracture a CT or MRI can be performed to exclude a nondisplaced injury. Otherwise no acute fracture or dislocation. Moderate joint space loss of the left hip with osteophytes. Mild on the right. Hyperostosis. Vascular calcifications are seen in the pelvis. There is a focal density in the tissues adjacent to the left greater trochanter. This is of uncertain etiology and was not present on the prior CT scan. Please correlate for artifact. IMPRESSION: Degenerative changes seen greatest of the left hip. On one view there is some subtle trabecular changes along the femoral neck. This could be technical rather than a true injury. If there is concern of fracture a CT or MRI may be of benefit to confirm a nondisplaced injury. Possible overlapping artifact or structure with a rounded density overlying the left hip. Please correlate with clinical findings. Electronically Signed   By: Jill Side M.D.   On: 10/21/2022 15:32   DG Lumbar Spine Complete  Result Date: 10/21/2022 CLINICAL DATA:  Chest pain and low back pain for 1 day. EXAM: LUMBAR SPINE - COMPLETE 4+ VIEW COMPARISON:  September 26, 2022 FINDINGS: There is no evidence of lumbar spine fracture. Grade 1 anterolisthesis of L4 on L5 is identified unchanged. Postsurgical changes in the posterior sacrum unchanged. Mild degenerative joint changes with anterior spurring, narrow intervertebral space is identified. IMPRESSION: No acute fracture or dislocation identified in the lumbar spine. Degenerative joint changes. Electronically Signed   By: Abelardo Diesel M.D.   On: 10/21/2022 13:14   DG Chest 2 View  Result Date: 10/21/2022 CLINICAL DATA:  Chest pain and low back pain for 1 day. EXAM: CHEST - 2 VIEW COMPARISON:  September 23, 2022 FINDINGS: The heart size and mediastinal contours are stable. Both lungs are clear. The visualized  skeletal structures are stable. IMPRESSION: No active cardiopulmonary disease. Electronically Signed   By: Abelardo Diesel M.D.   On: 10/21/2022 13:12        Scheduled Meds:  aspirin  81 mg Oral Daily   clopidogrel  75 mg Oral Daily   diltiazem  120 mg Oral Daily   dolutegravir  50 mg Oral Daily   ferrous sulfate  325 mg Oral Q breakfast   FLUoxetine  20 mg Oral Daily   gabapentin  300 mg Oral QHS   heparin  5,000 Units Subcutaneous Q12H   hydrALAZINE  50 mg Oral Q8H   isosorbide mononitrate  15 mg Oral Daily   mometasone-formoterol  2 puff Inhalation BID   polyethylene glycol  17 g Oral BID   rilpivirine  25 mg Oral Q breakfast   sodium bicarbonate  325 mg Oral BID   umeclidinium bromide  1 puff Inhalation Daily   Continuous Infusions:  sodium chloride 100 mL/hr at 10/22/22 0827     LOS: 0 days    Time spent:  35 minutes    Jakiyah Stepney A Nicolus Ose, MD [facility]   If 7PM-7AM, please contact night-coverage [email or web address]  10/22/2022, 3:07 PM

## 2022-10-23 DIAGNOSIS — N179 Acute kidney failure, unspecified: Secondary | ICD-10-CM | POA: Diagnosis not present

## 2022-10-23 LAB — BASIC METABOLIC PANEL
Anion gap: 6 (ref 5–15)
BUN: 34 mg/dL — ABNORMAL HIGH (ref 8–23)
CO2: 19 mmol/L — ABNORMAL LOW (ref 22–32)
Calcium: 8.2 mg/dL — ABNORMAL LOW (ref 8.9–10.3)
Chloride: 116 mmol/L — ABNORMAL HIGH (ref 98–111)
Creatinine, Ser: 2.94 mg/dL — ABNORMAL HIGH (ref 0.61–1.24)
GFR, Estimated: 22 mL/min — ABNORMAL LOW (ref >60)
Glucose, Bld: 119 mg/dL — ABNORMAL HIGH (ref 70–99)
Potassium: 3.9 mmol/L (ref 3.5–5.1)
Sodium: 141 mmol/L (ref 135–145)

## 2022-10-23 LAB — CK: Total CK: 474 U/L — ABNORMAL HIGH (ref 49–397)

## 2022-10-23 MED ORDER — DOLUTEGRAVIR SODIUM 50 MG PO TABS
50.0000 mg | ORAL_TABLET | Freq: Every day | ORAL | Status: DC
  Administered 2022-10-24 – 2022-10-25 (×2): 50 mg via ORAL
  Filled 2022-10-23 (×2): qty 1

## 2022-10-23 MED ORDER — FERROUS SULFATE 325 (65 FE) MG PO TABS
325.0000 mg | ORAL_TABLET | Freq: Every day | ORAL | Status: DC
  Administered 2022-10-24: 325 mg via ORAL
  Filled 2022-10-23: qty 1

## 2022-10-23 NOTE — Progress Notes (Signed)
PROGRESS NOTE    Michael [name]  PYK:[id number] DOB: Oct 29, 1950 DOA: 10/21/2022 PCP: [name], MD    Brief Narrative:   Michael [name] is a 72 y.o. male with past medical history significant for chronic diastolic congestive heart failure, paroxysmal atrial fibrillation, CAD s/p CABG/PCI, CKD stage IV, HIV on [place]ia [place], continued cocaine use disorder who presented to [facility] ED on 1/15 with mechanical fall, worsening of back pain and left hip pain.  Patient reports he fell on his left hip 2 days ago and has had difficulty ambulating with the use of a cane.  Also reports decreased oral intake since fall as well.  Denies loss of consciousness during the event.  Denies head or neck injury.  Continues to endorse crack cocaine use.  Denies chest pain.  In the ED, temperature 98.5 F, HR 68, RR 12, BP 131/82, SpO2 100% on room air.  WBC 4.6, hemoglobin 8.4, platelets 125.  Sodium 139, potassium 4.0, chloride 111, CO2 20, glucose 117, BUN 58, creatinine 4.37.  CK 1916.  High sensitive troponin 22 followed by 14.  COVID-19 PCR negative.  Influenza A/B PCR negative.  RSV PCR negative.  Urinalysis unrevealing.  UDS positive for cocaine.  Lumbar spine x-ray with no acute fracture or dislocation.  Left hip x-ray with degenerative changes.  Chest x-ray with no active cardiopulmonary disease process.  Left hip CT with severe left hip joint degenerative changes but no acute fracture or evidence of AVN; pelvis intact.  EDP consulted [facility] for admission for further evaluation management of acute on chronic renal failure, left hip pain.  Assessment & Plan:    Acute renal failure on CKD stage IV Patient presenting to ED following fall at home with left hip pain.  Also with poor oral intake.  Creatinine notably elevated on admission 4.37 with recent baseline 3.16 on recent discharge 10/14/2022.  Etiology likely secondary to prerenal azotemia in the setting of dehydration/poor oral intake. -- Cr 4.37>>2.94 --  Continue NS at 100 mL/h -- Sodium bicarb 325 mg p.o. twice daily -- Avoid nephrotoxins, renal dose all medications -- Repeat BMP in a.m.   Left hip pain Ambulatory dysfunction Patient reports mechanical fall; with left hip pain and and difficulty ambulating. Lumbar spine x-ray with no acute fracture or dislocation.  Left hip x-ray with degenerative changes.  Chest x-ray with no active cardiopulmonary disease process.  Left hip CT with severe left hip joint degenerative changes but no acute fracture or evidence of AVN; pelvis intact.  PT/OT initially recommended SNF, but unable due to his continued illicit drug use. -- Tylenol every 6 hours as needed mild pain -- Oxycodone 5 mg p.o. every 8 hours as needed moderate pain -- Continue therapy efforts while inpatient  Continued cocaine abuse Patient continues to endorse ongoing cocaine use, UDS positive for cocaine.  Denies chest pain.  Counseled on need for complete cessation  CAD s/p PCI/CABG -- Aspirin 81 mg p.o. daily, Plavix 75 mg p.o. daily, Imdur 50 mg p.o. daily -- Patient denies statin use outpatient  Paroxysmal atrial fibrillation Essential pretension No longer on systemic anticoagulation due to history of severe bleeding.  EKG with NSR on admission. -- Cardizem CD100 20 mg p.o. daily -- Hydralazine 50 mg p.o. every 8 hours -- Imdur 50 mg p.o. daily  Depression -- Fluoxetine 20 mg p.o. daily  Normocytic anemia, chronic -- Ferrous sulfate 305 mg p.o. daily  Neuropathy: Gabapentin 300 mg p.o. nightly  HIV On [place] patient.  Reports compliance.  Outpatient follow-up with infectious disease.  DM2 Hemoglobin A1c 7.7.  Diet controlled at baseline.  COPD No signs of acute exacerbation. -- Dulera 2 puffs twice daily -- Incruse Ellipta 1 puff daily  DVT prophylaxis: heparin injection 5,000 Units Start: 10/21/22 1415    Code Status: Full Code Family Communication: No family present at bedside this morning  Disposition  Plan:  Level of care: Telemetry Medical Status is: Inpatient Remains inpatient appropriate because: IV fluid hydration, anticipate discharge home versus homeless shelter tomorrow    Consultants:  None  Procedures:  None  Antimicrobials:  None   Subjective: Patient seen examined bedside, resting comfortably.  Lying in bed.  Sleeping but easily arousable.  Continues to complain of mild left hip pain.  Also reports did not eat much for breakfast this morning.  Encouraged patient to eat more for lunch.  Discussed given his renal function has markedly improved anticipate discharge home likely tomorrow.  No other specific questions or concerns at this time.  Denies headache, no dizziness, no chest pain, no palpitations, no shortness of breath, no abdominal pain, no fever/chills/night sweats, no nausea/vomiting/diarrhea, no focal weakness, no fatigue, no paresthesias.  No acute events overnight per nursing staff.  Objective: Vitals:   10/22/22 2100 10/23/22 0500 10/23/22 0846 10/23/22 0849  BP: 138/74 (!) 141/82 (!) 140/71   Pulse:  63 66 64  Resp:  '18 18 18  '$ Temp: 98 F (36.7 C) 98.1 F (36.7 C) (!) 97.5 F (36.4 C)   TempSrc: Oral Oral Oral   SpO2:   99% 98%  Weight:      Height:        Intake/Output Summary (Last 24 hours) at 10/23/2022 1130 Last data filed at 10/23/2022 0900 Gross per 24 hour  Intake 2494.83 ml  Output 1100 ml  Net 1394.83 ml   Filed Weights   10/21/22 0826  Weight: 76 kg    Examination:  Physical Exam: GEN: NAD, alert and oriented x 3, chronically ill appearance, appears older than stated age HEENT: NCAT, PERRL, EOMI, sclera clear, dry mucous membranes, poor dentition PULM: CTAB w/o wheezes/crackles, normal respiratory effort, on room air CV: RRR w/o M/G/R GI: abd soft, NTND, NABS, no R/G/M MSK: no peripheral edema, moves all extremities independently NEURO: CN II-XII intact, no focal deficits, sensation to light touch intact PSYCH: Depressed  mood, flat affect Integumentary: dry/intact, no rashes or wounds    Data Reviewed: I have personally reviewed following labs and imaging studies  CBC: Recent Labs  Lab 10/18/22 0824 10/21/22 0640 10/22/22 0456  WBC 3.6* 4.6 3.6*  NEUTROABS  --  3.2  --   HGB 7.8* 8.4* 7.3*  HCT 24.4* 25.7* 23.0*  MCV 89.1 88.3 88.5  PLT 97* 125* 607*   Basic Metabolic Panel: Recent Labs  Lab 10/18/22 0824 10/21/22 0640 10/22/22 0456 10/23/22 0826  NA 140 139 139 141  K 4.0 4.0 3.9 3.9  CL 111 111 114* 116*  CO2 23 20* 18* 19*  GLUCOSE 107* 117* 130* 119*  BUN 29* 58* 53* 34*  CREATININE 3.24* 4.37* 3.55* 2.94*  CALCIUM 8.7* 8.7* 7.8* 8.2*   GFR: Estimated Creatinine Clearance: 22 mL/min (A) (by C-G formula based on SCr of 2.94 mg/dL (H)). Liver Function Tests: Recent Labs  Lab 10/21/22 1127  AST 54*  ALT 51*  ALKPHOS 60  BILITOT 0.5  PROT 6.7  ALBUMIN 3.8   Recent Labs  Lab 10/21/22 1127  LIPASE 64*   No  results for input(s): "AMMONIA" in the last 168 hours. Coagulation Profile: No results for input(s): "INR", "PROTIME" in the last 168 hours. Cardiac Enzymes: Recent Labs  Lab 10/21/22 0640 10/22/22 0456 10/23/22 0826  CKTOTAL 1,916* 752* 474*   BNP (last 3 results) No results for input(s): "PROBNP" in the last 8760 hours. HbA1C: No results for input(s): "HGBA1C" in the last 72 hours. CBG: No results for input(s): "GLUCAP" in the last 168 hours. Lipid Profile: No results for input(s): "CHOL", "HDL", "LDLCALC", "TRIG", "CHOLHDL", "LDLDIRECT" in the last 72 hours. Thyroid Function Tests: No results for input(s): "TSH", "T4TOTAL", "FREET4", "T3FREE", "THYROIDAB" in the last 72 hours. Anemia Panel: Recent Labs    10/22/22 1532  TIBC 209*  IRON 65  RETICCTPCT 1.3   Sepsis Labs: Recent Labs  Lab 10/21/22 1155  LATICACIDVEN 0.8    Recent Results (from the past 240 hour(s))  Resp panel by RT-PCR (RSV, Flu A&B, Covid) Anterior Nasal Swab     Status:  None   Collection Time: 10/21/22 11:42 AM   Specimen: Anterior Nasal Swab  Result Value Ref Range Status   SARS Coronavirus 2 by RT PCR NEGATIVE NEGATIVE Final    Comment: (NOTE) SARS-CoV-2 target nucleic acids are NOT DETECTED.  The SARS-CoV-2 RNA is generally detectable in upper respiratory specimens during the acute phase of infection. The lowest concentration of SARS-CoV-2 viral copies this assay can detect is 138 copies/mL. A negative result does not preclude SARS-Cov-2 infection and should not be used as the sole basis for treatment or other patient management decisions. A negative result may occur with  improper specimen collection/handling, submission of specimen other than nasopharyngeal swab, presence of viral mutation(s) within the areas targeted by this assay, and inadequate number of viral copies(<138 copies/mL). A negative result must be combined with clinical observations, patient history, and epidemiological information. The expected result is Negative.  Fact Sheet for Patients:  EntrepreneurPulse.com.au  Fact Sheet for Healthcare Providers:  IncredibleEmployment.be  This test is no t yet approved or cleared by the [place] FDA and  has been authorized for detection and/or diagnosis of SARS-CoV-2 by FDA under an Emergency Use Authorization (EUA). This EUA will remain  in effect (meaning this test can be used) for the duration of the COVID-19 declaration under Section 564(b)(1) of the Act, 21 U.S.C.section 360bbb-3(b)(1), unless the authorization is terminated  or revoked sooner.       Influenza A by PCR NEGATIVE NEGATIVE Final   Influenza B by PCR NEGATIVE NEGATIVE Final    Comment: (NOTE) The Xpert Xpress SARS-CoV-2/FLU/RSV plus assay is intended as an aid in the diagnosis of influenza from Nasopharyngeal swab specimens and should not be used as a sole basis for treatment. Nasal washings and aspirates are unacceptable  for Xpert Xpress SARS-CoV-2/FLU/RSV testing.  Fact Sheet for Patients: EntrepreneurPulse.com.au  Fact Sheet for Healthcare Providers: IncredibleEmployment.be  This test is not yet approved or cleared by the [place] FDA and has been authorized for detection and/or diagnosis of SARS-CoV-2 by FDA under an Emergency Use Authorization (EUA). This EUA will remain in effect (meaning this test can be used) for the duration of the COVID-19 declaration under Section 564(b)(1) of the Act, 21 U.S.C. section 360bbb-3(b)(1), unless the authorization is terminated or revoked.     Resp Syncytial Virus by PCR NEGATIVE NEGATIVE Final    Comment: (NOTE) Fact Sheet for Patients: EntrepreneurPulse.com.au  Fact Sheet for Healthcare Providers: IncredibleEmployment.be  This test is not yet approved or cleared by the  [place] Architectural technologist and has been authorized for detection and/or diagnosis of SARS-CoV-2 by FDA under an Print production planner (EUA). This EUA will remain in effect (meaning this test can be used) for the duration of the COVID-19 declaration under Section 564(b)(1) of the Act, 21 U.S.C. section 360bbb-3(b)(1), unless the authorization is terminated or revoked.  Performed at [facility], [place] [address] [place], [place] [address]          Radiology Studies: CT HIP LEFT WO CONTRAST  Result Date: 10/21/2022 CLINICAL DATA:  [place] 2 days ago. Left hip pain. EXAM: CT OF THE LEFT HIP WITHOUT CONTRAST TECHNIQUE: Multidetector CT imaging of the left hip was performed according to the standard protocol. Multiplanar CT image reconstructions were also generated. RADIATION DOSE REDUCTION: This exam was performed according to the departmental dose-optimization program which includes automated exposure control, adjustment of the mA and/or kV according to patient size and/or use of iterative reconstruction  technique. COMPARISON:  CT scan 02/14/2022 FINDINGS: The left hip is normally located. Severe left hip joint degenerative changes with marked joint space narrowing, osteophytic spurring, bony eburnation, subchondral cystic change and loose ossified bodies. No acute fracture or evidence of AVN. Large stable os acetabuli. The pubic symphysis and SI joints are intact. No pelvic fractures are identified. The left hip and pelvic musculature are grossly normal by CT. No obvious large intramuscular hematoma. No subcutaneous hematoma. No significant intrapelvic abnormalities are identified. IMPRESSION: Severe left hip joint degenerative changes but no acute fracture or evidence of AVN. Intact visualized bony pelvis. Electronically Signed   By: Marijo Sanes M.D.   On: 10/21/2022 17:43   VAS [place] LOWER EXTREMITY VENOUS (DVT) (ONLY [facility] & [facility])  Result Date: 10/21/2022  Lower Venous DVT Study Patient Name:  ZOEY GILKESON  Date of Exam:   10/21/2022 Medical Rec #: [id number]            Accession #:    [id number] Date of Birth: 27-May-1951            Patient Gender: M Patient Age:   39 years Exam Location:  [facility] Procedure:      VAS [place] LOWER EXTREMITY VENOUS (DVT) Referring Phys: Gareth Morgan --------------------------------------------------------------------------------  Indications: Pain.  Comparison Study: Prior negative bilateral LEV done 07/17/2019 Performing Technologist: Sharion Dove RVS  Examination Guidelines: A complete evaluation includes B-mode imaging, spectral Doppler, color Doppler, and power Doppler as needed of all accessible portions of each vessel. Bilateral testing is considered an integral part of a complete examination. Limited examinations for reoccurring indications may be performed as noted. The reflux portion of the exam is performed with the patient in reverse Trendelenburg.  +-----+---------------+---------+-----------+----------+--------------+  RIGHTCompressibilityPhasicitySpontaneityPropertiesThrombus Aging +-----+---------------+---------+-----------+----------+--------------+ CFV  Full           Yes      Yes                                 +-----+---------------+---------+-----------+----------+--------------+   +---------+---------------+---------+-----------+----------+--------------+ LEFT     CompressibilityPhasicitySpontaneityPropertiesThrombus Aging +---------+---------------+---------+-----------+----------+--------------+ CFV      Full           Yes      Yes                                 +---------+---------------+---------+-----------+----------+--------------+ SFJ      Full                                                        +---------+---------------+---------+-----------+----------+--------------+  FV Prox  Full                                                        +---------+---------------+---------+-----------+----------+--------------+ FV Mid   Full                                                        +---------+---------------+---------+-----------+----------+--------------+ FV DistalFull                                                        +---------+---------------+---------+-----------+----------+--------------+ PFV      Full                                                        +---------+---------------+---------+-----------+----------+--------------+ POP      Full           Yes      Yes                                 +---------+---------------+---------+-----------+----------+--------------+ PTV      Full                                                        +---------+---------------+---------+-----------+----------+--------------+ PERO     Full                                                        +---------+---------------+---------+-----------+----------+--------------+     Summary: RIGHT: - No evidence of common femoral vein  obstruction.  LEFT: - There is no evidence of deep vein thrombosis in the lower extremity.  - No cystic structure found in the popliteal fossa.  *See table(s) above for measurements and observations. Electronically signed by Harold Barban MD on 10/21/2022 at 5:38:49 PM.    Final    DG HIP UNILAT WITH PELVIS 2-3 VIEWS LEFT  Result Date: 10/21/2022 CLINICAL DATA:  Patient fell on left hip 2 days ago EXAM: DG HIP (WITH OR WITHOUT PELVIS) 3V LEFT COMPARISON:  12/16/2016 x-ray.  CT abdomen pelvis 10/13/2022 FINDINGS: There is a question of disorganized trabecula along the femoral neck on one view. This could be technical though if there is concern specifically of the hip fracture a CT or MRI can be performed to exclude a nondisplaced injury. Otherwise no acute fracture or dislocation. Moderate joint space loss of the left hip with osteophytes. Mild on the right. Hyperostosis. Vascular calcifications are seen in the pelvis.  There is a focal density in the tissues adjacent to the left greater trochanter. This is of uncertain etiology and was not present on the prior CT scan. Please correlate for artifact. IMPRESSION: Degenerative changes seen greatest of the left hip. On one view there is some subtle trabecular changes along the femoral neck. This could be technical rather than a true injury. If there is concern of fracture a CT or MRI may be of benefit to confirm a nondisplaced injury. Possible overlapping artifact or structure with a rounded density overlying the left hip. Please correlate with clinical findings. Electronically Signed   By: Jill Side M.D.   On: 10/21/2022 15:32   DG Lumbar Spine Complete  Result Date: 10/21/2022 CLINICAL DATA:  Chest pain and low back pain for 1 day. EXAM: LUMBAR SPINE - COMPLETE 4+ VIEW COMPARISON:  September 26, 2022 FINDINGS: There is no evidence of lumbar spine fracture. Grade 1 anterolisthesis of L4 on L5 is identified unchanged. Postsurgical changes in the posterior sacrum  unchanged. Mild degenerative joint changes with anterior spurring, narrow intervertebral space is identified. IMPRESSION: No acute fracture or dislocation identified in the lumbar spine. Degenerative joint changes. Electronically Signed   By: Abelardo Diesel M.D.   On: 10/21/2022 13:14   DG Chest 2 View  Result Date: 10/21/2022 CLINICAL DATA:  Chest pain and low back pain for 1 day. EXAM: CHEST - 2 VIEW COMPARISON:  September 23, 2022 FINDINGS: The heart size and mediastinal contours are stable. Both lungs are clear. The visualized skeletal structures are stable. IMPRESSION: No active cardiopulmonary disease. Electronically Signed   By: Abelardo Diesel M.D.   On: 10/21/2022 13:12        Scheduled Meds:  aspirin  81 mg Oral Daily   clopidogrel  75 mg Oral Daily   diltiazem  120 mg Oral Daily   dolutegravir  50 mg Oral Daily   ferrous sulfate  325 mg Oral Q breakfast   FLUoxetine  20 mg Oral Daily   gabapentin  300 mg Oral QHS   heparin  5,000 Units Subcutaneous Q12H   hydrALAZINE  50 mg Oral Q8H   isosorbide mononitrate  15 mg Oral Daily   mometasone-formoterol  2 puff Inhalation BID   polyethylene glycol  17 g Oral BID   rilpivirine  25 mg Oral Q breakfast   sodium bicarbonate  325 mg Oral BID   umeclidinium bromide  1 puff Inhalation Daily   Continuous Infusions:  sodium chloride 100 mL/hr at 10/23/22 0103     LOS: 1 day    Time spent: 50 minutes spent on chart review, discussion with nursing staff, consultants, updating family and interview/physical exam; more than 50% of that time was spent in counseling and/or coordination of care.    Ardie Dragoo J [place] DO [facility] Available via Epic secure chat 7am-7pm After these hours, please refer to coverage provider listed on [email or web address] 10/23/2022, 11:30 AM

## 2022-10-23 NOTE — Evaluation (Signed)
Occupational Therapy Evaluation Patient Details Name: Michael [name] MRN: [id number] DOB: 03/16/1951 Today's Date: 10/23/2022   History of Present Illness 72 y.o. male presented to ED 1/15 with mechanical fall and worsening of back pain and left hip pain. Recently hospitalized 11/30-12/1/23 and again from 12/4-1/12. PMHx:HTN, chronic HFrEF with recovered LVEF 60 to 65%, PAF on Eliquis, CAD s/p CABG and stenting, DM, CKD stage IIIb, HIV on HAART, and cocaine abuse.   Clinical Impression   Pt presents with the above after recent DC to a friend's house. Pt reports using cane for mobility and managing ADLs with Modified Independence without issues. Pt presents now with limitations due to L hip pain from fall though anticipate good progress as pain resolves. Overall, pt currently requires no more than Min A for LB ADLs. Noted difficulty w/ SNF rehab placement in prior admission and pt declined [place] therapy services after working with [employer] program acutely. Anticipate no OT needs at DC though will follow acutely to ensure progress.       Recommendations for follow up therapy are one component of a multi-disciplinary discharge planning process, led by the attending physician.  Recommendations may be updated based on patient status, additional functional criteria and insurance authorization.   Follow Up Recommendations  No OT follow up     Assistance Recommended at Discharge PRN  Patient can return home with the following Assistance with cooking/housework;Direct supervision/assist for medications management    Functional Status Assessment  Patient has had a recent decline in their functional status and demonstrates the ability to make significant improvements in function in a reasonable and predictable amount of time.  Equipment Recommendations  None recommended by OT    Recommendations for Other Services       Precautions / Restrictions Precautions Precautions: Fall Restrictions Weight  Bearing Restrictions: No      Mobility Bed Mobility Overal bed mobility: Modified Independent                  Transfers Overall transfer level: Needs assistance Equipment used: Straight cane Transfers: Sit to/from Stand Sit to Stand: Min guard           General transfer comment: assist to steady, increased time      Balance Overall balance assessment: Needs assistance Sitting-balance support: No upper extremity supported, Feet supported Sitting balance-Leahy Scale: Good     Standing balance support: Single extremity supported, During functional activity Standing balance-Leahy Scale: Fair                             ADL either performed or assessed with clinical judgement   ADL Overall ADL's : Needs assistance/impaired Eating/Feeding: Independent   Grooming: Supervision/safety;Standing;Wash/dry face;Oral care Grooming Details (indicate cue type and reason): standing at sink > 7 min without issue Upper Body Bathing: Modified independent;Sitting   Lower Body Bathing: Minimal assistance;Sit to/from stand   Upper Body Dressing : Modified independent;Sitting   Lower Body Dressing: Minimal assistance;Sit to/from stand Lower Body Dressing Details (indicate cue type and reason): difficulty reaching and bending to L foot d/t L hip pain Toilet Transfer: Min guard;Ambulation (cane)   Toileting- Clothing Manipulation and Hygiene: Supervision/safety;Sitting/lateral lean;Sit to/from stand       Functional mobility during ADLs: Min guard;Cane General ADL Comments: Minor deficits due to L hip pain     Vision Baseline Vision/History: 0 No visual deficits Ability to See in Adequate Light: 0 Adequate Patient Visual Report: No  change from baseline Vision Assessment?: No apparent visual deficits     Perception     Praxis      Pertinent Vitals/Pain Pain Assessment Pain Assessment: Faces Faces Pain Scale: Hurts little more Pain Location: Lt hip Pain  Descriptors / Indicators: Grimacing, Guarding, Sore Pain Intervention(s): Monitored during session, Heat applied     Hand Dominance Right   Extremity/Trunk Assessment Upper Extremity Assessment Upper Extremity Assessment: Overall WFL for tasks assessed   Lower Extremity Assessment Lower Extremity Assessment: Defer to PT evaluation   Cervical / Trunk Assessment Cervical / Trunk Assessment: Normal   Communication Communication Communication: No difficulties   Cognition Arousal/Alertness: Awake/alert Behavior During Therapy: WFL for tasks assessed/performed Overall Cognitive Status: Within Functional Limits for tasks assessed                                 General Comments: likely at baseline, some decreased insight into health literacy     General Comments       Exercises     Shoulder Instructions      Home Living Family/patient expects to be discharged to:: Unsure Living Arrangements: Non-relatives/Friends                               Additional Comments: recently discharged to a friend's home but prior to this was staying in a motel      Prior Functioning/Environment Prior Level of Function : History of Falls (last six months);Needs assist             Mobility Comments: Using SPC at friends house, very congested home, tripped on furniture. ADLs Comments: MOD I ADLs, able to do basic household iADLs at DC        OT Problem List: Decreased strength;Impaired balance (sitting and/or standing)      OT Treatment/Interventions: Self-care/ADL training;Therapeutic activities;Balance training;Patient/family education    OT Goals(Current goals can be found in the care plan section) Acute Rehab OT Goals Patient Stated Goal: go to rehab OT Goal Formulation: With patient Time For Goal Achievement: 11/06/22 Potential to Achieve Goals: Good  OT Frequency: Min 2X/week    Co-evaluation              AM-PAC OT "6 Clicks" Daily  Activity     Outcome Measure Help from another person eating meals?: None Help from another person taking care of personal grooming?: A Little Help from another person toileting, which includes using toliet, bedpan, or urinal?: A Little Help from another person bathing (including washing, rinsing, drying)?: A Little Help from another person to put on and taking off regular upper body clothing?: A Little Help from another person to put on and taking off regular lower body clothing?: A Little 6 Click Score: 19   End of Session Equipment Utilized During Treatment: Other (comment) (cane) Nurse Communication: Mobility status  Activity Tolerance: Patient tolerated treatment well Patient left: in bed;with call bell/phone within reach;with bed alarm set  OT Visit Diagnosis: Unsteadiness on feet (R26.81);Muscle weakness (generalized) (M62.81)                Time: 0177-9390 OT Time Calculation (min): 20 min Charges:  OT General Charges $OT Visit: 1 Visit OT Evaluation $OT Eval Low Complexity: 1 Low  Malachy Chamber, OTR/L Acute Rehab Services Office: [phone]   Layla Maw 10/23/2022, 12:44 PM

## 2022-10-23 NOTE — Progress Notes (Signed)
Mobility Specialist Progress Note:   10/23/22 1525  Mobility  Activity Ambulated with assistance in hallway  Level of Assistance Minimal assist, patient does 75% or more  Assistive Device Cane  Distance Ambulated (ft) 100 ft  Activity Response Tolerated well  Mobility Referral Yes  $Mobility charge 1 Mobility   Session limited by back and L hip pain. MinG required during ambulation, minA to return to supine at EOS. Pt left with all needs met, bed alarm on.  Nelta Numbers Mobility Specialist Please contact via SecureChat or  Rehab office at [phone]

## 2022-10-23 NOTE — TOC Initial Note (Signed)
Transition of Care [facility]) - Initial/Assessment Note    Patient Details  Name: Michael [name] MRN: [id number] Date of Birth: November 12, 1950  [facility]) CM/SW Contact:    Milinda Antis, [place] Phone Number: 10/23/2022, 11:22 AM  Clinical Narrative:                 LCSW received consult for possible SNF placement at time of discharge. CSW spoke with patient. Patient continues to report that he fell at the 'friend's home" (no name given) that he discharged to after previous admission even though he was found down at the police station.  PT's recommendation is SNF.  The barriers to SNF remain the same as patient's previous admission (homelessness, substance use, previous psych history, ect.) and barriers continue to increase as the patient declined home health services, declined recommended DME, and used cocaine after previous discharge and was subsequently readmitted two days later.    LCSW received consult for substance use and spoke with patient. Patient declined resources for housing and substance use and reports still having resources given during previous admission.    TOC following  Skilled Nursing Rehab Facilities-   [email or web address]   Ratings out of 5 stars (5 the highest)   Name Address  Phone # [place] Inspection Overall  [facility] [address], [place] '4 5 2 3  '$ [facility]  [address] [place], [place] [phone] '4 2 5 5  '$ [facility] [place], [place] '1 3 1 1  '$ [place], [place] '2 2 4 4  '$ [facility] [address], [place] '2 1 2 1  '$ [facility] [address], [place] '3 3 4 4  '$ [facility] [address] [place] '4 1 3 2  '$ [facility] [address] [place] '4 1 3 2  '$ [address] ([place]) [place], [place] [phone] '3 1 2 1  '$ [facility] [address], [place] [phone] '3 1 1 1  '$ [facility] [address], [facility] [phone] '3 2 2 2  '$ [facility] ([place]) [address] [place], [place] [phone] '3 1 1 1  '$ Dustin Flock [address] [place] [phone] '4 2 4 4          '$ [place] [address], [place] '4 1 3 2  '$ [facility] [place] [address], [place] '3 1 5 4  '$ [address] [place], [place] [phone] '1 1 2 1  '$ [facility] [address], [employer] [phone] '2 2 4 4          '$ [address] (no [facility]) [place] [address], [place] [phone] '5 5 5 5  '$ [facility] (No Humana) [address], [place] '4 1 4 3  '$ [facility] (No [employer]) [place], [place] '5 5 5 5  '$ [facility] [address], [employer] [phone] '2 3 5 5  '$ [place] [address] [place] '1 1 2 1  '$ [facility] [address], [place] [phone] '3 1 1 1  '$ [place], [place] '5 2 5 5  '$ [facility]  [address] [place] '2 2 1 1  '$ [facility] [address], [place] '3 2 1 '$ 1  [facility] [place], [place] '2 2 2 2          '$ [facility] [address], [place] [phone] '1 1 1 1  '$ [facility] [address] [place]  [phone] '2 4 3 3  '$ [facility] [address], [place] [phone] '3 2 3 3  '$ [place] [address] [place] '2 1 1 1  '$ [place] (No Humana) [address], [place] [phone] '2 2 3 3  '$ [facility]) [address], [place] [phone] '2 1 1 1          '$ [facility] [place], [place] '5 4 5 5  '$ [facility])  [address], [place] '2 1 2 1  '$ [facility]) [place] [address] [place] '3 1 4 3  '$ [place] [address] [place] '3 3 4 4  '$ [address] [place], [place] '2 3 1 1  '$ [facility]) [address] [place] [phone] '2 1 4 3    '$ Expected Discharge Plan: Homeless Shelter Barriers to Discharge: Continued Medical Work up   Patient Goals and CMS Choice Patient states their goals for this hospitalization and ongoing recovery are:: Unknown          Expected Discharge Plan and Services In-house Referral: Clinical Social Work     Living arrangements for the past 2 months: Hotel/Motel, Apartment                                      Prior Living Arrangements/Services Living arrangements for the past 2 months: Hotel/Motel, Apartment Lives with:: Self Patient language and need for interpreter reviewed:: Yes Do you feel safe going back to the place where you live?: Yes      Need for Family Participation in Patient Care: No (Comment) Care giver support system in place?: No (comment)   Criminal Activity/Legal Involvement Pertinent to Current Situation/Hospitalization: No - Comment as needed  Activities of Daily Living Home Assistive Devices/Equipment: Cane (specify quad or straight) ADL Screening (condition at time of admission) Patient's cognitive ability adequate to safely complete daily activities?: Yes Is the patient deaf or have difficulty hearing?: No Does the patient have difficulty seeing, even when wearing glasses/contacts?: Yes Does the patient have difficulty concentrating, remembering, or making decisions?: Yes Patient able to express need for assistance with ADLs?: No Does the patient have difficulty dressing or bathing?: Yes Independently performs ADLs?: Yes (appropriate for developmental age) Does the patient have  difficulty walking or climbing stairs?: Yes Weakness of Legs: Both Weakness of Arms/Hands: Both  Permission Sought/Granted         Permission granted to share info w AGENCY: SNF, housing options        Emotional Assessment Appearance:: Appears stated age Attitude/Demeanor/Rapport: Engaged Affect (typically observed): Pleasant Orientation: : Oriented to Situation, Oriented to  Time, Oriented to Place, Oriented to Self Alcohol / Substance Use: Illicit Drugs Psych Involvement: No (comment)  Admission diagnosis:  Left leg pain [M79.605] Elevated CK [R74.8] Generalized weakness [R53.1] AKI (acute kidney injury) ([place]) [N17.9] Chest pain, unspecified type [R07.9] Acute renal failure superimposed on chronic kidney disease, unspecified acute renal failure type, unspecified CKD stage ([place]) [N17.9, N18.9] Patient Active Problem List   Diagnosis Date Noted   AKI (acute  kidney injury) ([place]) 10/21/2022   Hyperkalemia 10/05/2022   CKD (chronic kidney disease), stage IV ([place]) 09/23/2022   Polysubstance abuse ([place]) 09/13/2022   ARF (acute renal failure) ([place]) 09/09/2022   (HFpEF) heart failure with preserved ejection fraction ([place]) 09/09/2022   Elevated troponin 09/09/2022   Impaired functional mobility, balance, and endurance 11/29/2021   Transfusion history 11/29/2021   Unstable balance 11/29/2021   Dilated cardiomyopathy ([place]) 11/29/2021   Arthritis 11/29/2021   Diabetic foot infection ([place]) 06/20/2021   Presence of aortocoronary bypass graft 04/16/2021   Iron deficiency anemia 04/04/2021   Atypical chest pain 04/03/2021   Anemia of chronic disease 12/14/2020   Type 2 diabetes mellitus with hyperlipidemia ([place]) 08/14/2020   Idiopathic chronic gout of multiple sites without tophus 02/17/2020   Diabetic ulcer of toe of right foot associated with type 2 diabetes mellitus ([place]) 12/17/2019   Vitamin B12 deficiency 12/17/2019   Homelessness 12/13/2019   Cellulitis 12/07/2019    Osteomyelitis ([place]) 07/17/2019   Abscess or cellulitis of toe, right    Toe pain, right-second    MDD (major depressive disorder), recurrent episode, severe ([place]) 06/15/2019   Respiratory distress 04/12/2019   Pneumonia due to severe acute respiratory syndrome coronavirus 2 (SARS-CoV-2) 04/11/2019   COVID-19 virus infection 03/19/2019   Chest pain 10/03/2018   Malnutrition of moderate degree 09/21/2018   MDD (major depressive disorder), recurrent episode, moderate ([name])    Type II diabetes mellitus with renal manifestations ([place]) 05/04/2018   GERD (gastroesophageal reflux disease) 05/04/2018   S/P CABG (coronary artery bypass graft)    Left testicular pain    Depression 02/20/2018   Epididymo-orchitis, acute 02/20/2018   Essential hypertension 02/20/2018   Precordial chest pain 02/20/2018   Acute kidney injury superimposed on chronic kidney disease ([place]) 02/14/2018   HCAP (healthcare-associated pneumonia) 02/14/2018   History of pulmonary embolism 07/04/2017   Acute hyponatremia 05/11/2017   Hyperglycemia due to type 2 diabetes mellitus ([place]) 05/11/2017   CHF (congestive heart failure) ([place]) 04/01/2017   Hepatitis C 12/30/2016   Substance induced mood disorder ([place]) 11/23/2016   Chronic ischemic heart disease 11/12/2016   Paroxysmal A-fib ([place]) 11/12/2016   Back pain 04/18/2016   S/P carotid endarterectomy 11/15/2015   COPD (chronic obstructive pulmonary disease) ([place]) 10/22/2015   Stenosis of cervical spine with myelopathy ([place]) 10/20/2015   MDD (major depressive disorder), recurrent severe, without psychosis ([place]) 09/09/2015   History of fusion of cervical spine 08/28/2015   Cocaine-induced mood disorder ([place]) 08/14/2015   Cocaine abuse with cocaine-induced mood disorder ([place]) 08/14/2015   Gout 07/10/2015   Acute renal failure superimposed on stage 3 chronic kidney disease ([place]) 03/06/2015   Anemia 03/06/2015   Chronic kidney disease, stage III (moderate) ([place]) 03/06/2015    Hypoglycemia    Encounter for general adult medical examination with abnormal findings 02/09/2015   Cocaine use disorder, moderate, dependence ([place]) 12/13/2014   Substance or medication-induced depressive disorder with onset during withdrawal ([place]) 12/13/2014   Severe recurrent major depressive disorder with psychotic features ([place]) 12/12/2014   Major depressive disorder, recurrent, severe without psychotic features ([place])    Suicidal ideations 08/15/2014   Cervicalgia 06/28/2014   Lumbar radiculopathy, chronic 06/28/2014   Asthma, chronic 02/03/2014   S/P percutaneous transluminal coronary angioplasty 10/15/2013   3-vessel CAD 06/24/2013   ED (erectile dysfunction) of organic origin 07/07/2012   Cocaine abuse, continuous ([place]) 05/13/2012   Hypertension goal BP (blood pressure) < 140/80 04/29/2012   Chondromalacia of left knee 03/19/2012  Hyperlipidemia with target LDL less than 100 02/12/2012   Fibromyalgia 02/12/2012   Cocaine use disorder ([place]) 01/10/2012    Class: Acute   Human immunodeficiency virus (HIV) disease ([place]) 12/16/2011   HIV (human immunodeficiency virus infection) ([place]) 08/27/2011   Uncontrolled type 2 diabetes with neuropathy 10/17/2000   PCP:  [name], MD Pharmacy:   [name] [address] [address] Phone: [phone] Fax: [place] [address] [place] [address] Phone: [phone] Fax: [place], [place] - [address] [place] [address] Phone: [phone] Fax: [phone]     Social Determinants of Health (SDOH) Social History: [place]: No Food Insecurity (10/22/2022)  Recent Concern: [place] Present (09/10/2022)  Housing: High Risk (10/22/2022)  Transportation Needs: Unmet Transportation Needs (10/22/2022)   Utilities: At Risk (10/22/2022)  Alcohol Screen: Low Risk  (06/15/2019)  Depression (PHQ2-9): High Risk (11/12/2021)  Financial Resource Strain: Medium Risk (09/16/2018)  Physical Activity: Unknown (04/22/2019)  Social Connections: Socially Isolated (09/16/2018)  Stress: Stress Concern Present (09/16/2018)  Tobacco Use: Low Risk  (10/21/2022)   SDOH Interventions:     Readmission Risk Interventions    07/05/2022   10:08 AM  Readmission Risk Prevention Plan  Transportation Screening Complete  Medication Review ([place]) Complete  PCP or Specialist appointment within 3-5 days of discharge Complete  HRI or [place] Complete  SW Recovery Care/Counseling Consult Complete  [place] Not Applicable

## 2022-10-24 ENCOUNTER — Other Ambulatory Visit (HOSPITAL_COMMUNITY): Payer: Self-pay

## 2022-10-24 DIAGNOSIS — N179 Acute kidney failure, unspecified: Secondary | ICD-10-CM | POA: Diagnosis not present

## 2022-10-24 LAB — BASIC METABOLIC PANEL
Anion gap: 7 (ref 5–15)
BUN: 24 mg/dL — ABNORMAL HIGH (ref 8–23)
CO2: 18 mmol/L — ABNORMAL LOW (ref 22–32)
Calcium: 8.3 mg/dL — ABNORMAL LOW (ref 8.9–10.3)
Chloride: 116 mmol/L — ABNORMAL HIGH (ref 98–111)
Creatinine, Ser: 2.66 mg/dL — ABNORMAL HIGH (ref 0.61–1.24)
GFR, Estimated: 25 mL/min — ABNORMAL LOW (ref >60)
Glucose, Bld: 93 mg/dL (ref 70–99)
Potassium: 3.9 mmol/L (ref 3.5–5.1)
Sodium: 141 mmol/L (ref 135–145)

## 2022-10-24 MED ORDER — CLOPIDOGREL BISULFATE 75 MG PO TABS
75.0000 mg | ORAL_TABLET | Freq: Every day | ORAL | 0 refills | Status: AC
  Filled 2022-10-24: qty 30, 30d supply, fill #0

## 2022-10-24 MED ORDER — BUDESONIDE-FORMOTEROL FUMARATE 160-4.5 MCG/ACT IN AERO
2.0000 | INHALATION_SPRAY | Freq: Two times a day (BID) | RESPIRATORY_TRACT | 0 refills | Status: DC
  Filled 2022-10-24: qty 10.2, 30d supply, fill #0

## 2022-10-24 MED ORDER — RENA-VITE PO TABS
1.0000 | ORAL_TABLET | Freq: Every day | ORAL | 0 refills | Status: AC
  Filled 2022-10-24: qty 30, 30d supply, fill #0

## 2022-10-24 MED ORDER — FLUOXETINE HCL 20 MG PO CAPS
20.0000 mg | ORAL_CAPSULE | Freq: Every day | ORAL | 0 refills | Status: DC
  Filled 2022-10-24: qty 30, 30d supply, fill #0

## 2022-10-24 MED ORDER — DILTIAZEM HCL ER COATED BEADS 120 MG PO CP24
120.0000 mg | ORAL_CAPSULE | Freq: Every day | ORAL | 0 refills | Status: DC
  Filled 2022-10-24: qty 30, 30d supply, fill #0

## 2022-10-24 MED ORDER — HYDRALAZINE HCL 50 MG PO TABS
50.0000 mg | ORAL_TABLET | Freq: Three times a day (TID) | ORAL | 0 refills | Status: DC
  Filled 2022-10-24: qty 90, 30d supply, fill #0

## 2022-10-24 MED ORDER — SODIUM BICARBONATE 650 MG PO TABS
325.0000 mg | ORAL_TABLET | Freq: Two times a day (BID) | ORAL | 0 refills | Status: AC
  Filled 2022-10-24: qty 30, 30d supply, fill #0

## 2022-10-24 MED ORDER — INCRUSE ELLIPTA 62.5 MCG/ACT IN AEPB
1.0000 | INHALATION_SPRAY | Freq: Every day | RESPIRATORY_TRACT | 0 refills | Status: AC
  Filled 2022-10-24: qty 30, 30d supply, fill #0

## 2022-10-24 MED ORDER — DOLUTEGRAVIR-RILPIVIRINE 50-25 MG PO TABS
1.0000 | ORAL_TABLET | Freq: Every day | ORAL | 0 refills | Status: AC
  Filled 2022-10-24 (×2): qty 30, 30d supply, fill #0

## 2022-10-24 MED ORDER — SODIUM BICARBONATE 650 MG PO TABS
650.0000 mg | ORAL_TABLET | Freq: Two times a day (BID) | ORAL | Status: DC
  Administered 2022-10-24 – 2022-10-25 (×3): 650 mg via ORAL
  Filled 2022-10-24 (×3): qty 1

## 2022-10-24 MED ORDER — ISOSORBIDE MONONITRATE ER 30 MG PO TB24
15.0000 mg | ORAL_TABLET | Freq: Every day | ORAL | 0 refills | Status: DC
  Filled 2022-10-24: qty 15, 30d supply, fill #0

## 2022-10-24 MED ORDER — ASPIRIN 81 MG PO CHEW
81.0000 mg | CHEWABLE_TABLET | Freq: Every day | ORAL | 0 refills | Status: AC
  Filled 2022-10-24: qty 30, 30d supply, fill #0

## 2022-10-24 MED ORDER — GABAPENTIN 300 MG PO CAPS
300.0000 mg | ORAL_CAPSULE | Freq: Every day | ORAL | 0 refills | Status: DC
  Filled 2022-10-24: qty 30, 30d supply, fill #0

## 2022-10-24 MED ORDER — THIAMINE HCL 100 MG PO TABS
100.0000 mg | ORAL_TABLET | Freq: Every day | ORAL | 0 refills | Status: AC
  Filled 2022-10-24: qty 30, 30d supply, fill #0

## 2022-10-24 MED ORDER — ATORVASTATIN CALCIUM 40 MG PO TABS
40.0000 mg | ORAL_TABLET | Freq: Every day | ORAL | 0 refills | Status: DC
  Filled 2022-10-24: qty 30, 30d supply, fill #0

## 2022-10-24 MED ORDER — ALBUTEROL SULFATE HFA 108 (90 BASE) MCG/ACT IN AERS
1.0000 | INHALATION_SPRAY | Freq: Four times a day (QID) | RESPIRATORY_TRACT | 0 refills | Status: DC | PRN
  Filled 2022-10-24: qty 6.7, 25d supply, fill #0

## 2022-10-24 MED ORDER — FERROUS SULFATE 325 (65 FE) MG PO TABS
325.0000 mg | ORAL_TABLET | Freq: Every day | ORAL | 0 refills | Status: DC
  Filled 2022-10-24: qty 30, 30d supply, fill #0

## 2022-10-24 NOTE — Progress Notes (Signed)
RN discussed with pt the discharge plans for 1/19 am. He states he understands process. Pt currently has some meds delivered to his room and is aware he has more meds in the main pharmacy that will be obtained tomorrow on his way out

## 2022-10-24 NOTE — Discharge Instructions (Signed)
Please abstain from further crack cocaine use as this is a large complicating factor to your recurrent hospitalizations and overall decline in terms of your chronic comorbidities.

## 2022-10-24 NOTE — Progress Notes (Signed)
Mobility Specialist Progress Note:   10/24/22 0900  Mobility  Activity Ambulated with assistance in hallway  Level of Assistance Minimal assist, patient does 75% or more  Assistive Device Cane  Distance Ambulated (ft) 100 ft  Activity Response Tolerated well  Mobility Referral Yes  $Mobility charge 1 Mobility   Pt continues to be limited by back and L hip pain. Required up to minA during session, with increased unsteadiness throughout. Pt back in bed with all needs met.   Nelta Numbers Mobility Specialist Please contact via SecureChat or  Rehab office at [phone]

## 2022-10-24 NOTE — Progress Notes (Signed)
According to patient's RN he is discharging to a rehab facility. He isn't discharge lounge appropriate.   SWOT RN

## 2022-10-24 NOTE — Progress Notes (Signed)
PROGRESS NOTE    Michael [name]  LFY:[id number] DOB: 06/17/1951 DOA: 10/21/2022 PCP: [name], MD    Brief Narrative:   Tell Rozelle is a 72 y.o. male with past medical history significant for chronic diastolic congestive heart failure, paroxysmal atrial fibrillation, CAD s/p CABG/PCI, CKD stage IV, HIV on [place]ia [place], continued cocaine use disorder who presented to [facility] ED on 1/15 with mechanical fall, worsening of back pain and left hip pain.  Patient reports he fell on his left hip 2 days ago and has had difficulty ambulating with the use of a cane.  Also reports decreased oral intake since fall as well.  Denies loss of consciousness during the event.  Denies head or neck injury.  Continues to endorse crack cocaine use.  Denies chest pain.  In the ED, temperature 98.5 F, HR 68, RR 12, BP 131/82, SpO2 100% on room air.  WBC 4.6, hemoglobin 8.4, platelets 125.  Sodium 139, potassium 4.0, chloride 111, CO2 20, glucose 117, BUN 58, creatinine 4.37.  CK 1916.  High sensitive troponin 22 followed by 14.  COVID-19 PCR negative.  Influenza A/B PCR negative.  RSV PCR negative.  Urinalysis unrevealing.  UDS positive for cocaine.  Lumbar spine x-ray with no acute fracture or dislocation.  Left hip x-ray with degenerative changes.  Chest x-ray with no active cardiopulmonary disease process.  Left hip CT with severe left hip joint degenerative changes but no acute fracture or evidence of AVN; pelvis intact.  EDP consulted [facility] for admission for further evaluation management of acute on chronic renal failure, left hip pain.  Assessment & Plan:    Acute renal failure on CKD stage IV Patient presenting to ED following fall at home with left hip pain.  Also with poor oral intake.  Creatinine notably elevated on admission 4.37 with recent baseline 3.16 on recent discharge 10/14/2022.  Etiology likely secondary to prerenal azotemia in the setting of dehydration/poor oral intake. -- Cr 4.37>>2.94>2.66 --  Sodium bicarb 650 mg p.o. twice daily -- Avoid nephrotoxins, renal dose all medications  Left hip pain Ambulatory dysfunction Patient reports mechanical fall; with left hip pain and and difficulty ambulating. Lumbar spine x-ray with no acute fracture or dislocation.  Left hip x-ray with degenerative changes.  Chest x-ray with no active cardiopulmonary disease process.  Left hip CT with severe left hip joint degenerative changes but no acute fracture or evidence of AVN; pelvis intact.  PT/OT initially recommended SNF, but unable due to his continued illicit drug use. -- Tylenol every 6 hours as needed mild pain -- Oxycodone 5 mg p.o. every 8 hours as needed moderate pain -- Continue therapy efforts while inpatient  Continued cocaine abuse Patient continues to endorse ongoing cocaine use, UDS positive for cocaine.  Denies chest pain.  Counseled on need for complete cessation  CAD s/p PCI/CABG -- Aspirin 81 mg p.o. daily, Plavix 75 mg p.o. daily, Imdur 50 mg p.o. daily -- Patient denies statin use outpatient  Paroxysmal atrial fibrillation Essential pretension No longer on systemic anticoagulation due to history of severe bleeding.  EKG with NSR on admission. -- Cardizem CD100 20 mg p.o. daily -- Hydralazine 50 mg p.o. every 8 hours -- Imdur 50 mg p.o. daily  Depression -- Fluoxetine 20 mg p.o. daily  Normocytic anemia, chronic -- Ferrous sulfate 305 mg p.o. daily  Neuropathy: Gabapentin 300 mg p.o. nightly  HIV On [place] patient.  Reports compliance.  Outpatient follow-up with infectious disease.  DM2 Hemoglobin A1c  7.7.  Diet controlled at baseline.  COPD No signs of acute exacerbation. -- Dulera 2 puffs twice daily -- Incruse Ellipta 1 puff daily  DVT prophylaxis: heparin injection 5,000 Units Start: 10/21/22 1415    Code Status: Full Code Family Communication: No family present at bedside this morning  Disposition Plan:  Level of care: Telemetry Medical Status is:  Inpatient Remains inpatient appropriate because: IV fluid hydration, anticipate discharge to facility in [place] per social work tomorrow, bus departs at [employer]:  None  Procedures:  None  Antimicrobials:  None   Subjective: Patient seen examined bedside, resting comfortably.  Lying in bed.  Sleeping but easily arousable.  Mobilized with mobility tech today.  Social worker has facility in [place] willing to accept patient tomorrow.  Patient has bus transportation set up for 9:55 AM.  No other specific questions or concerns at this time.  Denies headache, no dizziness, no chest pain, no palpitations, no shortness of breath, no abdominal pain, no fever/chills/night sweats, no nausea/vomiting/diarrhea, no focal weakness, no fatigue, no paresthesias.  No acute events overnight per nursing staff.  Discharge to facility tomorrow.  Objective: Vitals:   10/23/22 2053 10/24/22 0500 10/24/22 0747 10/24/22 1008  BP: (!) 147/76 (!) 170/63  (!) 160/76  Pulse: 64 69 75 74  Resp: '19 14 16 17  '$ Temp: 98.1 F (36.7 C) 98.2 F (36.8 C)  98.2 F (36.8 C)  TempSrc: Oral Oral    SpO2: 99% 99% 98% 96%  Weight:      Height:        Intake/Output Summary (Last 24 hours) at 10/24/2022 1430 Last data filed at 10/24/2022 1015 Gross per 24 hour  Intake 1620 ml  Output 1900 ml  Net -280 ml   Filed Weights   10/21/22 0826  Weight: 76 kg    Examination:  Physical Exam: GEN: NAD, alert and oriented x 3, chronically ill appearance, appears older than stated age HEENT: NCAT, PERRL, EOMI, sclera clear, dry mucous membranes, poor dentition PULM: CTAB w/o wheezes/crackles, normal respiratory effort, on room air CV: RRR w/o M/G/R GI: abd soft, NTND, NABS, no R/G/M MSK: no peripheral edema, moves all extremities independently NEURO: CN II-XII intact, no focal deficits, sensation to light touch intact PSYCH: Depressed mood, flat affect Integumentary: dry/intact, no rashes or  wounds    Data Reviewed: I have personally reviewed following labs and imaging studies  CBC: Recent Labs  Lab 10/18/22 0824 10/21/22 0640 10/22/22 0456  WBC 3.6* 4.6 3.6*  NEUTROABS  --  3.2  --   HGB 7.8* 8.4* 7.3*  HCT 24.4* 25.7* 23.0*  MCV 89.1 88.3 88.5  PLT 97* 125* 829*   Basic Metabolic Panel: Recent Labs  Lab 10/18/22 0824 10/21/22 0640 10/22/22 0456 10/23/22 0826 10/24/22 0640  NA 140 139 139 141 141  K 4.0 4.0 3.9 3.9 3.9  CL 111 111 114* 116* 116*  CO2 23 20* 18* 19* 18*  GLUCOSE 107* 117* 130* 119* 93  BUN 29* 58* 53* 34* 24*  CREATININE 3.24* 4.37* 3.55* 2.94* 2.66*  CALCIUM 8.7* 8.7* 7.8* 8.2* 8.3*   GFR: Estimated Creatinine Clearance: 24.3 mL/min (A) (by C-G formula based on SCr of 2.66 mg/dL (H)). Liver Function Tests: Recent Labs  Lab 10/21/22 1127  AST 54*  ALT 51*  ALKPHOS 60  BILITOT 0.5  PROT 6.7  ALBUMIN 3.8   Recent Labs  Lab 10/21/22 1127  LIPASE 64*   No results for input(s): "AMMONIA"  in the last 168 hours. Coagulation Profile: No results for input(s): "INR", "PROTIME" in the last 168 hours. Cardiac Enzymes: Recent Labs  Lab 10/21/22 0640 10/22/22 0456 10/23/22 0826  CKTOTAL 1,916* 752* 474*   BNP (last 3 results) No results for input(s): "PROBNP" in the last 8760 hours. HbA1C: No results for input(s): "HGBA1C" in the last 72 hours. CBG: No results for input(s): "GLUCAP" in the last 168 hours. Lipid Profile: No results for input(s): "CHOL", "HDL", "LDLCALC", "TRIG", "CHOLHDL", "LDLDIRECT" in the last 72 hours. Thyroid Function Tests: No results for input(s): "TSH", "T4TOTAL", "FREET4", "T3FREE", "THYROIDAB" in the last 72 hours. Anemia Panel: Recent Labs    10/22/22 1532  TIBC 209*  IRON 65  RETICCTPCT 1.3   Sepsis Labs: Recent Labs  Lab 10/21/22 1155  LATICACIDVEN 0.8    Recent Results (from the past 240 hour(s))  Resp panel by RT-PCR (RSV, Flu A&B, Covid) Anterior Nasal Swab     Status: None    Collection Time: 10/21/22 11:42 AM   Specimen: Anterior Nasal Swab  Result Value Ref Range Status   SARS Coronavirus 2 by RT PCR NEGATIVE NEGATIVE Final    Comment: (NOTE) SARS-CoV-2 target nucleic acids are NOT DETECTED.  The SARS-CoV-2 RNA is generally detectable in upper respiratory specimens during the acute phase of infection. The lowest concentration of SARS-CoV-2 viral copies this assay can detect is 138 copies/mL. A negative result does not preclude SARS-Cov-2 infection and should not be used as the sole basis for treatment or other patient management decisions. A negative result may occur with  improper specimen collection/handling, submission of specimen other than nasopharyngeal swab, presence of viral mutation(s) within the areas targeted by this assay, and inadequate number of viral copies(<138 copies/mL). A negative result must be combined with clinical observations, patient history, and epidemiological information. The expected result is Negative.  Fact Sheet for Patients:  EntrepreneurPulse.com.au  Fact Sheet for Healthcare Providers:  IncredibleEmployment.be  This test is no t yet approved or cleared by the [place] FDA and  has been authorized for detection and/or diagnosis of SARS-CoV-2 by FDA under an Emergency Use Authorization (EUA). This EUA will remain  in effect (meaning this test can be used) for the duration of the COVID-19 declaration under Section 564(b)(1) of the Act, 21 U.S.C.section 360bbb-3(b)(1), unless the authorization is terminated  or revoked sooner.       Influenza A by PCR NEGATIVE NEGATIVE Final   Influenza B by PCR NEGATIVE NEGATIVE Final    Comment: (NOTE) The Xpert Xpress SARS-CoV-2/FLU/RSV plus assay is intended as an aid in the diagnosis of influenza from Nasopharyngeal swab specimens and should not be used as a sole basis for treatment. Nasal washings and aspirates are unacceptable for  Xpert Xpress SARS-CoV-2/FLU/RSV testing.  Fact Sheet for Patients: EntrepreneurPulse.com.au  Fact Sheet for Healthcare Providers: IncredibleEmployment.be  This test is not yet approved or cleared by the [place] FDA and has been authorized for detection and/or diagnosis of SARS-CoV-2 by FDA under an Emergency Use Authorization (EUA). This EUA will remain in effect (meaning this test can be used) for the duration of the COVID-19 declaration under Section 564(b)(1) of the Act, 21 U.S.C. section 360bbb-3(b)(1), unless the authorization is terminated or revoked.     Resp Syncytial Virus by PCR NEGATIVE NEGATIVE Final    Comment: (NOTE) Fact Sheet for Patients: EntrepreneurPulse.com.au  Fact Sheet for Healthcare Providers: IncredibleEmployment.be  This test is not yet approved or cleared by the [place] and  has been authorized for detection and/or diagnosis of SARS-CoV-2 by FDA under an Emergency Use Authorization (EUA). This EUA will remain in effect (meaning this test can be used) for the duration of the COVID-19 declaration under Section 564(b)(1) of the Act, 21 U.S.C. section 360bbb-3(b)(1), unless the authorization is terminated or revoked.  Performed at [facility], [place] [address], [place], [place] [address]          Radiology Studies: No results found.      Scheduled Meds:  aspirin  81 mg Oral Daily   clopidogrel  75 mg Oral Daily   diltiazem  120 mg Oral Daily   dolutegravir  50 mg Oral Q breakfast   ferrous sulfate  325 mg Oral Q supper   FLUoxetine  20 mg Oral Daily   gabapentin  300 mg Oral QHS   heparin  5,000 Units Subcutaneous Q12H   hydrALAZINE  50 mg Oral Q8H   isosorbide mononitrate  15 mg Oral Daily   mometasone-formoterol  2 puff Inhalation BID   polyethylene glycol  17 g Oral BID   rilpivirine  25 mg Oral Q breakfast   sodium bicarbonate  650 mg  Oral BID   umeclidinium bromide  1 puff Inhalation Daily   Continuous Infusions:     LOS: 2 days    Time spent: 50 minutes spent on chart review, discussion with nursing staff, consultants, updating family and interview/physical exam; more than 50% of that time was spent in counseling and/or coordination of care.    Mikiah Demond J [place] DO [facility] Available via Epic secure chat 7am-7pm After these hours, please refer to coverage provider listed on [email or web address] 10/24/2022, 2:30 PM

## 2022-10-24 NOTE — TOC Progression Note (Signed)
Transition of Care [facility]) - Progression Note    Patient Details  Name: Michael [name] MRN: [id number] Date of Birth: [birth year]  Transition of Care [facility]) CM/SW Contact  Tom-Johnson, Renea Ee, RN Phone Number: 10/24/2022, 3:27 PM  Clinical Narrative:     Home health PT recommended, patient will be going to a substance abuse rehab facility and home health referral not required at this time. Rollator recommended, patient just received a cane a week ago, has to pay out of pocket for another mobility DME, patient declined, cannot afford.  CM will continue to follow.       Expected Discharge Plan: Homeless Shelter Barriers to Discharge: Continued Medical Work up  Expected Discharge Plan and Services In-house Referral: Clinical Social Work     Living arrangements for the past 2 months: Hotel/Motel, Apartment Expected Discharge Date: 10/24/22                                     Social Determinants of Health (SDOH) Interventions SDOH Screenings   Food Insecurity: No Food Insecurity (10/22/2022)  Recent Concern: [place] Present (09/10/2022)  Housing: High Risk (10/22/2022)  Transportation Needs: Unmet Transportation Needs (10/22/2022)  Utilities: At Risk (10/22/2022)  Alcohol Screen: Low Risk  (06/15/2019)  Depression (PHQ2-9): High Risk (11/12/2021)  Financial Resource Strain: Medium Risk (09/16/2018)  Physical Activity: Unknown (04/22/2019)  Social Connections: Socially Isolated (09/16/2018)  Stress: Stress Concern Present (09/16/2018)  Tobacco Use: Low Risk  (10/21/2022)    Readmission Risk Interventions    07/05/2022   10:08 AM  Readmission Risk Prevention Plan  Transportation Screening Complete  Medication Review ([place]) Complete  PCP or Specialist appointment within 3-5 days of discharge Complete  HRI or [place] Complete  SW Recovery Care/Counseling Consult Complete  [place] Not Applicable

## 2022-10-24 NOTE — Progress Notes (Signed)
Physical Therapy Treatment Patient Details Name: Michael [name] MRN: [id number] DOB: 08-25-1951 Today's Date: 10/24/2022   History of Present Illness 72 y.o. male presented to ED 1/15 with mechanical fall and worsening of back pain and left hip pain. Recently hospitalized 11/30-12/1/23 and again from 12/4-1/12. PMHx:HTN, chronic HFrEF with recovered LVEF 60 to 65%, PAF on Eliquis, CAD s/p CABG and stenting, DM, CKD stage IIIb, HIV on HAART, and cocaine abuse.    PT Comments    Tolerated treatment well. Gait training with SPC, 105 feet, declines further distance due to fatigue. No dyspnea noted. No physical assist needed with bed mobility, transfers, or gait. Educated on safety awareness and being alert of surrounding hazards due to reported fall (tripping on furniture prior to admission.) All questions answered. Pt states he is going to [facility] for substance abuse rehab. Will follow and progress during this admission.   Recommendations for follow up therapy are one component of a multi-disciplinary discharge planning process, led by the attending physician.  Recommendations may be updated based on patient status, additional functional criteria and insurance authorization.  Follow Up Recommendations  Home health PT Can patient physically be transported by private vehicle: Yes   Assistance Recommended at Discharge PRN  Patient can return home with the following Assist for transportation   Equipment Recommendations  Other (comment) (upright Rollator)    Recommendations for Other Services       Precautions / Restrictions Precautions Precautions: Fall Restrictions Weight Bearing Restrictions: No     Mobility  Bed Mobility Overal bed mobility: Modified Independent             General bed mobility comments: extra time, in and out without assist today.    Transfers Overall transfer level: Needs assistance Equipment used: Straight cane Transfers: Sit to/from Stand Sit  to Stand: Supervision           General transfer comment: Supervision for safety. No buckling noted. Slow and effortful rise but without physical assistance. Stable with use of SPC for support this afternoon.    Ambulation/Gait Ambulation/Gait assistance: Supervision Gait Distance (Feet): 105 Feet Assistive device: Straight cane Gait Pattern/deviations: Step-through pattern, Decreased step length - right, Decreased step length - left, Trunk flexed, Narrow base of support, Antalgic Gait velocity: slowed Gait velocity interpretation: <1.31 ft/sec, indicative of household ambulator   General Gait Details: Slightly flexed trunk. Cues for sequencing with this SPC  from home, appropriately adjusted. No buckling noted, mild instability with supervision for safety. Pt fatigued with distance, declines further gait.   Stairs             Wheelchair Mobility    Modified Rankin (Stroke Patients Only)       Balance Overall balance assessment: Needs assistance Sitting-balance support: No upper extremity supported, Feet supported Sitting balance-Leahy Scale: Good     Standing balance support: During functional activity, No upper extremity supported Standing balance-Leahy Scale: Fair                              Cognition Arousal/Alertness: Awake/alert Behavior During Therapy: WFL for tasks assessed/performed Overall Cognitive Status: Within Functional Limits for tasks assessed                                 General Comments: likely at baseline, some decreased insight into health literacy  Exercises      General Comments        Pertinent Vitals/Pain Pain Assessment Pain Assessment: No/denies pain Pain Intervention(s): Monitored during session    Home Living                          Prior Function            PT Goals (current goals can now be found in the care plan section) Acute Rehab PT Goals Patient Stated Goal:  go to [place] PT Goal Formulation: With patient Time For Goal Achievement: 11/05/22 Potential to Achieve Goals: Good Progress towards PT goals: Progressing toward goals    Frequency    Min 3X/week      PT Plan Discharge plan needs to be updated    Co-evaluation              AM-PAC PT "6 Clicks" Mobility   Outcome Measure  Help needed turning from your back to your side while in a flat bed without using bedrails?: None Help needed moving from lying on your back to sitting on the side of a flat bed without using bedrails?: None Help needed moving to and from a bed to a chair (including a wheelchair)?: None Help needed standing up from a chair using your arms (e.g., wheelchair or bedside chair)?: None Help needed to walk in hospital room?: A Little Help needed climbing 3-5 steps with a railing? : A Little 6 Click Score: 22    End of Session Equipment Utilized During Treatment: Gait belt Activity Tolerance: Patient tolerated treatment well Patient left: in bed;with call bell/phone within reach;with bed alarm set   PT Visit Diagnosis: Unsteadiness on feet (R26.81);Other abnormalities of gait and mobility (R26.89);History of falling (Z91.81);Muscle weakness (generalized) (M62.81);Difficulty in walking, not elsewhere classified (R26.2)     Time: 3532-9924 PT Time Calculation (min) (ACUTE ONLY): 11 min  Charges:  $Gait Training: 8-22 mins                     Michael [name], PT, DPT Physical Therapist Acute Rehabilitation Services [place]    Ellouise Newer 10/24/2022, 1:33 PM

## 2022-10-24 NOTE — Progress Notes (Signed)
Mobility Specialist Progress Note:   10/24/22 1625  Mobility  Activity Ambulated with assistance in hallway  Level of Assistance Contact guard assist, steadying assist  Assistive Device Cane  Distance Ambulated (ft) 110 ft  Activity Response Tolerated well  Mobility Referral Yes  $Mobility charge 1 Mobility   Pt agreeable to second mobility session, despite incr pain. Required only minG assist during ambulation. Pt back in bed with all needs met.   Nelta Numbers Mobility Specialist Please contact via SecureChat or  Rehab office at [phone]

## 2022-10-24 NOTE — TOC Transition Note (Signed)
Pt is being discharged tomorrow early in the morning before TOC opens. Discharge meds are being stored in main pharmacy. RN aware to pick up meds at main pharmacy.

## 2022-10-24 NOTE — TOC Progression Note (Signed)
Transition of Care [facility]) - Initial/Assessment Note    Patient Details  Name: Michael [name] MRN: [id number] Date of Birth: 12-02-1950  Transition of Care [facility]) CM/SW Contact:    Milinda Antis, LCSWA Phone Number: 10/24/2022, 10:28 AM  Clinical Narrative:                 LCSW contacted local shelters.  There is no availability at this time at in the [place] or [place] area.  LCSW contacted [facility] leadership to inquire about the ability to pay for a hotel room until the patient receives his social security.  LCSW was informed that due to the patient's substance use, paying for a hotel is not an option.    LCSW contacted [facility] to inquire about bed openings.  The facility has a male bed available.  The patient was screened and can be admitted.  The patient will be transported via [employer] to the facility.  Patient and care team informed.  The patient's floor RN for tomorrow, Jeani Hawking, was informed that the cab voucher to get to the bus station and bus ticket were left on the chart.  The patient will need to discharge from the hospital by 0830 to get to the bus station.  RN  also informed that the patient's medications will need to be retrieved from the main pharmacy and given to patient before he discharges.    Expected Discharge Plan: Homeless Shelter Barriers to Discharge: Continued Medical Work up   Patient Goals and CMS Choice Patient states their goals for this hospitalization and ongoing recovery are:: Unknown          Expected Discharge Plan and Services In-house Referral: Clinical Social Work     Living arrangements for the past 2 months: Hotel/Motel, Apartment Expected Discharge Date: 10/24/22                                    Prior Living Arrangements/Services Living arrangements for the past 2 months: Hotel/Motel, Apartment Lives with:: Self Patient language and need for interpreter reviewed:: Yes Do you feel safe going back to the  place where you live?: Yes      Need for Family Participation in Patient Care: No (Comment) Care giver support system in place?: No (comment)   Criminal Activity/Legal Involvement Pertinent to Current Situation/Hospitalization: No - Comment as needed  Activities of Daily Living Home Assistive Devices/Equipment: Cane (specify quad or straight) ADL Screening (condition at time of admission) Patient's cognitive ability adequate to safely complete daily activities?: Yes Is the patient deaf or have difficulty hearing?: No Does the patient have difficulty seeing, even when wearing glasses/contacts?: Yes Does the patient have difficulty concentrating, remembering, or making decisions?: Yes Patient able to express need for assistance with ADLs?: No Does the patient have difficulty dressing or bathing?: Yes Independently performs ADLs?: Yes (appropriate for developmental age) Does the patient have difficulty walking or climbing stairs?: Yes Weakness of Legs: Both Weakness of Arms/Hands: Both  Permission Sought/Granted         Permission granted to share info w AGENCY: SNF, housing options        Emotional Assessment Appearance:: Appears stated age Attitude/Demeanor/Rapport: Engaged Affect (typically observed): Pleasant Orientation: : Oriented to Situation, Oriented to  Time, Oriented to Place, Oriented to Self Alcohol / Substance Use: Illicit Drugs Psych Involvement: No (comment)  Admission diagnosis:  Left leg pain [M79.605] Elevated CK [  R74.8] Generalized weakness [R53.1] AKI (acute kidney injury) ([place]) [N17.9] Chest pain, unspecified type [R07.9] Acute renal failure superimposed on chronic kidney disease, unspecified acute renal failure type, unspecified CKD stage ([place]) [N17.9, N18.9] Patient Active Problem List   Diagnosis Date Noted   AKI (acute kidney injury) ([place]) 10/21/2022   Hyperkalemia 10/05/2022   CKD (chronic kidney disease), stage IV ([place]) 09/23/2022    Polysubstance abuse ([place]) 09/13/2022   ARF (acute renal failure) ([place]) 09/09/2022   (HFpEF) heart failure with preserved ejection fraction ([place]) 09/09/2022   Elevated troponin 09/09/2022   Impaired functional mobility, balance, and endurance 11/29/2021   Transfusion history 11/29/2021   Unstable balance 11/29/2021   Dilated cardiomyopathy ([place]) 11/29/2021   Arthritis 11/29/2021   Diabetic foot infection ([place]) 06/20/2021   Presence of aortocoronary bypass graft 04/16/2021   Iron deficiency anemia 04/04/2021   Atypical chest pain 04/03/2021   Anemia of chronic disease 12/14/2020   Type 2 diabetes mellitus with hyperlipidemia ([place]) 08/14/2020   Idiopathic chronic gout of multiple sites without tophus 02/17/2020   Diabetic ulcer of toe of right foot associated with type 2 diabetes mellitus ([place]) 12/17/2019   Vitamin B12 deficiency 12/17/2019   Homelessness 12/13/2019   Cellulitis 12/07/2019   Osteomyelitis ([place]) 07/17/2019   Abscess or cellulitis of toe, right    Toe pain, right-second    MDD (major depressive disorder), recurrent episode, severe ([place]) 06/15/2019   Respiratory distress 04/12/2019   Pneumonia due to severe acute respiratory syndrome coronavirus 2 (SARS-CoV-2) 04/11/2019   COVID-19 virus infection 03/19/2019   Chest pain 10/03/2018   Malnutrition of moderate degree 09/21/2018   MDD (major depressive disorder), recurrent episode, moderate ([name])    Type II diabetes mellitus with renal manifestations ([place]) 05/04/2018   GERD (gastroesophageal reflux disease) 05/04/2018   S/P CABG (coronary artery bypass graft)    Left testicular pain    Depression 02/20/2018   Epididymo-orchitis, acute 02/20/2018   Essential hypertension 02/20/2018   Precordial chest pain 02/20/2018   Acute kidney injury superimposed on chronic kidney disease ([place]) 02/14/2018   HCAP (healthcare-associated pneumonia) 02/14/2018   History of pulmonary embolism 07/04/2017   Acute hyponatremia 05/11/2017    Hyperglycemia due to type 2 diabetes mellitus ([place]) 05/11/2017   CHF (congestive heart failure) ([place]) 04/01/2017   Hepatitis C 12/30/2016   Substance induced mood disorder ([place]) 11/23/2016   Chronic ischemic heart disease 11/12/2016   Paroxysmal A-fib ([place]) 11/12/2016   Back pain 04/18/2016   S/P carotid endarterectomy 11/15/2015   COPD (chronic obstructive pulmonary disease) ([place]) 10/22/2015   Stenosis of cervical spine with myelopathy ([place]) 10/20/2015   MDD (major depressive disorder), recurrent severe, without psychosis ([place]) 09/09/2015   History of fusion of cervical spine 08/28/2015   Cocaine-induced mood disorder ([place]) 08/14/2015   Cocaine abuse with cocaine-induced mood disorder ([place]) 08/14/2015   Gout 07/10/2015   Acute renal failure superimposed on stage 3 chronic kidney disease ([place]) 03/06/2015   Anemia 03/06/2015   Chronic kidney disease, stage III (moderate) ([place]) 03/06/2015   Hypoglycemia    Encounter for general adult medical examination with abnormal findings 02/09/2015   Cocaine use disorder, moderate, dependence ([place]) 12/13/2014   Substance or medication-induced depressive disorder with onset during withdrawal ([place]) 12/13/2014   Severe recurrent major depressive disorder with psychotic features ([place]) 12/12/2014   Major depressive disorder, recurrent, severe without psychotic features ([place])    Suicidal ideations 08/15/2014   Cervicalgia 06/28/2014   Lumbar radiculopathy, chronic 06/28/2014   Asthma, chronic 02/03/2014  S/P percutaneous transluminal coronary angioplasty 10/15/2013   3-vessel CAD 06/24/2013   ED (erectile dysfunction) of organic origin 07/07/2012   Cocaine abuse, continuous ([place]) 05/13/2012   Hypertension goal BP (blood pressure) < 140/80 04/29/2012   Chondromalacia of left knee 03/19/2012   Hyperlipidemia with target LDL less than 100 02/12/2012   Fibromyalgia 02/12/2012   Cocaine use disorder ([place]) 01/10/2012    Class: Acute   Human  immunodeficiency virus (HIV) disease ([place]) 12/16/2011   HIV (human immunodeficiency virus infection) ([place]) 08/27/2011   Uncontrolled type 2 diabetes with neuropathy 10/17/2000   PCP:  [name], MD Pharmacy:   [name] [address] [place] [address] Phone: [phone] Fax: [place] [address] [place] [address] Phone: [phone] Fax: [place], [place] - [address] [place] [address] Phone: [phone] Fax: [phone]     Social Determinants of Health (SDOH) Social History: [place]: No Food Insecurity (10/22/2022)  Recent Concern: [place] Present (09/10/2022)  Housing: High Risk (10/22/2022)  Transportation Needs: Unmet Transportation Needs (10/22/2022)  Utilities: At Risk (10/22/2022)  Alcohol Screen: Low Risk  (06/15/2019)  Depression (PHQ2-9): High Risk (11/12/2021)  Financial Resource Strain: Medium Risk (09/16/2018)  Physical Activity: Unknown (04/22/2019)  Social Connections: Socially Isolated (09/16/2018)  Stress: Stress Concern Present (09/16/2018)  Tobacco Use: Low Risk  (10/21/2022)   SDOH Interventions:     Readmission Risk Interventions    07/05/2022   10:08 AM  Readmission Risk Prevention Plan  Transportation Screening Complete  Medication Review ([place]) Complete  PCP or Specialist appointment within 3-5 days of discharge Complete  HRI or [place] Complete  SW Recovery Care/Counseling Consult Complete  [place] Not Applicable

## 2022-10-24 NOTE — Discharge Summary (Addendum)
Physician Discharge Summary  Kit Mollett HER:[id number] DOB: 1951-03-26 DOA: 10/21/2022  PCP: [name], MD  Admit date: 10/21/2022 Discharge date: 10/25/2022  Admitted From: Home Disposition: [place]  Recommendations for Outpatient Follow-up:  Follow up with PCP in 1-2 weeks Continue to encourage adherence with his home medications; as patient's has been noncompliant Continue encourage complete cessation/abstinence from crack cocaine as this is the leading etiology/confounding factor to his recurrent hospitalizations and worsening of his chronic comorbidities; as well as a significant detriment to his health  Patient has a high bounce back potential given his nonadherence to medical therapy, self-neglect with continued active crack cocaine abuse.  Home Health: None Equipment/Devices: Single-point cane  Discharge Condition: Stable; but given his overall self-neglect and nonadherence with active substance abuse overall prognosis poor CODE STATUS: Full code Diet recommendation: Heart healthy diet  History of present illness:  Maritza Goldsborough is a 72 y.o. male with past medical history significant for chronic diastolic congestive heart failure, paroxysmal atrial fibrillation, CAD s/p CABG/PCI, CKD stage IV, HIV on Elnoria [place], continued cocaine use disorder who presented to [facility] ED on 1/15 with mechanical fall, worsening of back pain and left hip pain.  Patient reports he fell on his left hip 2 days ago and has had difficulty ambulating with the use of a cane.  Also reports decreased oral intake since fall as well.  Denies loss of consciousness during the event.  Denies head or neck injury.  Continues to endorse crack cocaine use.  Denies chest pain.   In the ED, temperature 98.5 F, HR 68, RR 12, BP 131/82, SpO2 100% on room air.  WBC 4.6, hemoglobin 8.4, platelets 125.  Sodium 139, potassium 4.0, chloride 111, CO2 20, glucose 117, BUN 58, creatinine 4.37.  CK  1916.  High sensitive troponin 22 followed by 14.  COVID-19 PCR negative.  Influenza A/B PCR negative.  RSV PCR negative.  Urinalysis unrevealing.  UDS positive for cocaine.  Lumbar spine x-ray with no acute fracture or dislocation.  Left hip x-ray with degenerative changes.  Chest x-ray with no active cardiopulmonary disease process.  Left hip CT with severe left hip joint degenerative changes but no acute fracture or evidence of AVN; pelvis intact.  EDP consulted [facility] for admission for further evaluation management of acute on chronic renal failure, left hip pain.  Hospital course:  Acute renal failure on CKD stage IV Patient presenting to ED following fall at home with left hip pain.  Also with poor oral intake.  Creatinine notably elevated on admission 4.37 with recent baseline 3.16 on recent discharge 10/14/2022.  Etiology likely secondary to prerenal azotemia in the setting of dehydration/poor oral intake.  Patient was started on IV fluid hydration with improvement of creatinine to 2.66 at time of discharge.  Encourage increased oral intake.  Continue sodium bicarbonate tabs twice daily.  Outpatient follow-up with PCP with repeat BMP 1 week.   Left hip pain Ambulatory dysfunction Patient reports mechanical fall; with left hip pain and and difficulty ambulating. Lumbar spine x-ray with no acute fracture or dislocation.  Left hip x-ray with degenerative changes.  Chest x-ray with no active cardiopulmonary disease process.  Left hip CT with severe left hip joint degenerative changes but no acute fracture or evidence of AVN; pelvis intact.  PT/OT initially recommended SNF, but unable due to his continued illicit drug use.  Patient was able to ambulate 100 feet with mobility tech prior to discharge.  Continue use of single-point  cane.  Outpatient follow-up with PCP.   Continued cocaine abuse Patient continues to endorse ongoing cocaine use, UDS positive for cocaine.  Denies chest pain.  Counseled on need  for complete cessation/abstinence as this is a high confounding factor to his recurrent hospitalizations and worsening of his chronic comorbidities.   CAD s/p PCI/CABG Aspirin 81 mg p.o. daily, Plavix 75 mg p.o. daily, Imdur 50 mg p.o. daily. Patient denies statin use outpatient   Paroxysmal atrial fibrillation Essential pretension No longer on systemic anticoagulation due to history of severe bleeding.  EKG with NSR on admission.  Continue Cardizem CD 120 mg p.o. daily,  Hydralazine 50 mg p.o. every 8 hours, Imdur 50 mg p.o. daily   Depression Fluoxetine 20 mg p.o. daily   Normocytic anemia, chronic Ferrous sulfate 305 mg p.o. daily   Neuropathy: Gabapentin 300 mg p.o. nightly   HIV On Juluca outpatient.  Outpatient follow-up with infectious disease.   DM2 Hemoglobin A1c 7.7.  Diet controlled at baseline.   COPD No signs of acute exacerbation.  Symbicort and Incruse Ellipta.  Medical noncompliance Patient is nonadherent to his medication regimen outpatient.  Additionally continues with active illicit substance abuse.  This is the leading etiology to his recurrent hospitalizations with self-neglect.  Patient with a high bounce back potential given his nonadherence and continued substance abuse.  Discharge Diagnoses:  Principal Problem:   AKI (acute kidney injury) ([place]) Active Problems:   ARF (acute renal failure) ([name])   (HFpEF) heart failure with preserved ejection fraction ([name])   Back pain   CKD (chronic kidney disease), stage IV [facility])    Discharge Instructions  Discharge Instructions     Call MD for:  difficulty breathing, headache or visual disturbances   Complete by: As directed    Call MD for:  extreme fatigue   Complete by: As directed    Call MD for:  persistant dizziness or light-headedness   Complete by: As directed    Call MD for:  persistant nausea and vomiting   Complete by: As directed    Call MD for:  severe uncontrolled pain   Complete by: As  directed    Call MD for:  temperature >100.4   Complete by: As directed    Diet - low sodium heart healthy   Complete by: As directed    Increase activity slowly   Complete by: As directed    Increase activity slowly   Complete by: As directed       Allergies as of 10/25/2022   No Known Allergies      Medication List     TAKE these medications    Accu-Chek Guide test strip Generic drug: glucose blood use as directed up to four times daily   Accu-Chek Guide w/Device Kit Use as directed.   Accu-Chek Softclix Lancets lancets Use as directed up to four times daily   albuterol 108 (90 Base) MCG/ACT inhaler Commonly known as: VENTOLIN HFA Inhale 1-2 puffs into the lungs every 6 (six) hours as needed for wheezing or shortness of breath.   Aspirin Low Dose 81 MG chewable tablet Generic drug: aspirin Chew 1 tablet (81 mg total) by mouth daily.   atorvastatin 40 MG tablet Commonly known as: LIPITOR Take 1 tablet (40 mg total) by mouth daily.   Cartia XT 120 MG 24 hr capsule Generic drug: diltiazem Take 1 capsule (120 mg total) by mouth daily.   clopidogrel 75 MG tablet Commonly known as: PLAVIX Take 1 tablet (75  mg total) by mouth daily.   ferrous sulfate 325 (65 FE) MG tablet Take 1 tablet (325 mg total) by mouth daily with breakfast.   FLUoxetine 20 MG capsule Commonly known as: PROZAC Take 1 capsule (20 mg total) by mouth daily.   gabapentin 300 MG capsule Commonly known as: NEURONTIN Take 1 capsule (300 mg total) by mouth at bedtime.   hydrALAZINE 50 MG tablet Commonly known as: APRESOLINE Take 1 tablet (50 mg total) by mouth every 8 (eight) hours.   Incruse Ellipta 62.5 MCG/ACT Aepb Generic drug: umeclidinium bromide Inhale 1 puff into the lungs daily.   isosorbide mononitrate 30 MG 24 hr tablet Commonly known as: IMDUR Take 0.5 tablets (15 mg total) by mouth daily.   Juluca 50-25 MG tablet Generic drug: dolutegravir-rilpivirine Take 1 tablet by  mouth daily before lunch.   meclizine 25 MG tablet Commonly known as: ANTIVERT Take 1 tablet (25 mg total) by mouth 3 (three) times daily as needed for dizziness.   multivitamin Tabs tablet Take 1 tablet by mouth at bedtime.   nitroGLYCERIN 0.4 MG SL tablet Commonly known as: NITROSTAT Place 1 tablet (0.4 mg total) under the tongue every 5 (five) minutes as needed for chest pain.   polyethylene glycol powder 17 GM/SCOOP powder Commonly known as: GLYCOLAX/MIRALAX Take 17 g by mouth 2 (two) times daily.   sodium bicarbonate 650 MG tablet Take 0.5 tablets (325 mg total) by mouth 2 (two) times daily.   Symbicort 160-4.5 MCG/ACT inhaler Generic drug: budesonide-formoterol Inhale 2 puffs into the lungs in the morning and at bedtime.   thiamine 100 MG tablet Commonly known as: VITAMIN B1 Take 1 tablet (100 mg total) by mouth daily. What changed: how much to take   Unifine Pentips 31G X 5 MM Misc Generic drug: Insulin Pen Needle Use with insulin pens What changed:  how much to take how to take this when to take this   BD Pen Needle Nano U/F 32G X 4 MM Misc Generic drug: Insulin Pen Needle Use as directed up to 4 times daily. What changed: Another medication with the same name was changed. Make sure you understand how and when to take each.        Follow-up Information     [name], MD. Schedule an appointment as soon as possible for a visit in 1 week(s).   Specialty: Internal Medicine Contact information: [place] [address] [phone]                No Known Allergies  Consultations: None   Procedures/Studies: CT HIP LEFT WO CONTRAST  Result Date: 10/21/2022 CLINICAL DATA:  [place] 2 days ago. Left hip pain. EXAM: CT OF THE LEFT HIP WITHOUT CONTRAST TECHNIQUE: Multidetector CT imaging of the left hip was performed according to the standard protocol. Multiplanar CT image reconstructions were also generated.  RADIATION DOSE REDUCTION: This exam was performed according to the departmental dose-optimization program which includes automated exposure control, adjustment of the mA and/or kV according to patient size and/or use of iterative reconstruction technique. COMPARISON:  CT scan 02/14/2022 FINDINGS: The left hip is normally located. Severe left hip joint degenerative changes with marked joint space narrowing, osteophytic spurring, bony eburnation, subchondral cystic change and loose ossified bodies. No acute fracture or evidence of AVN. Large stable os acetabuli. The pubic symphysis and SI joints are intact. No pelvic fractures are identified. The left hip and pelvic musculature are grossly normal by CT. No  obvious large intramuscular hematoma. No subcutaneous hematoma. No significant intrapelvic abnormalities are identified. IMPRESSION: Severe left hip joint degenerative changes but no acute fracture or evidence of AVN. Intact visualized bony pelvis. Electronically Signed   By: Marijo Sanes M.D.   On: 10/21/2022 17:43   VAS [place] LOWER EXTREMITY VENOUS (DVT) (ONLY [facility] & [facility])  Result Date: 10/21/2022  Lower Venous DVT Study Patient Name:  AMARIAN BOTERO  Date of Exam:   10/21/2022 Medical Rec #: [id number]            Accession #:    [id number] Date of Birth: Apr 23, 1951            Patient Gender: M Patient Age:   72 years Exam Location:  [facility] Procedure:      VAS [place] LOWER EXTREMITY VENOUS (DVT) Referring Phys: Gareth Morgan --------------------------------------------------------------------------------  Indications: Pain.  Comparison Study: Prior negative bilateral LEV done 07/17/2019 Performing Technologist: Sharion Dove RVS  Examination Guidelines: A complete evaluation includes B-mode imaging, spectral Doppler, color Doppler, and power Doppler as needed of all accessible portions of each vessel. Bilateral testing is considered an integral part of a complete examination. Limited examinations for  reoccurring indications may be performed as noted. The reflux portion of the exam is performed with the patient in reverse Trendelenburg.  +-----+---------------+---------+-----------+----------+--------------+ RIGHTCompressibilityPhasicitySpontaneityPropertiesThrombus Aging +-----+---------------+---------+-----------+----------+--------------+ CFV  Full           Yes      Yes                                 +-----+---------------+---------+-----------+----------+--------------+   +---------+---------------+---------+-----------+----------+--------------+ LEFT     CompressibilityPhasicitySpontaneityPropertiesThrombus Aging +---------+---------------+---------+-----------+----------+--------------+ CFV      Full           Yes      Yes                                 +---------+---------------+---------+-----------+----------+--------------+ SFJ      Full                                                        +---------+---------------+---------+-----------+----------+--------------+ FV Prox  Full                                                        +---------+---------------+---------+-----------+----------+--------------+ FV Mid   Full                                                        +---------+---------------+---------+-----------+----------+--------------+ FV DistalFull                                                        +---------+---------------+---------+-----------+----------+--------------+ PFV  Full                                                        +---------+---------------+---------+-----------+----------+--------------+ POP      Full           Yes      Yes                                 +---------+---------------+---------+-----------+----------+--------------+ PTV      Full                                                        +---------+---------------+---------+-----------+----------+--------------+  PERO     Full                                                        +---------+---------------+---------+-----------+----------+--------------+     Summary: RIGHT: - No evidence of common femoral vein obstruction.  LEFT: - There is no evidence of deep vein thrombosis in the lower extremity.  - No cystic structure found in the popliteal fossa.  *See table(s) above for measurements and observations. Electronically signed by Harold Barban MD on 10/21/2022 at 5:38:49 PM.    Final    DG HIP UNILAT WITH PELVIS 2-3 VIEWS LEFT  Result Date: 10/21/2022 CLINICAL DATA:  Patient fell on left hip 2 days ago EXAM: DG HIP (WITH OR WITHOUT PELVIS) 3V LEFT COMPARISON:  12/16/2016 x-ray.  CT abdomen pelvis 10/13/2022 FINDINGS: There is a question of disorganized trabecula along the femoral neck on one view. This could be technical though if there is concern specifically of the hip fracture a CT or MRI can be performed to exclude a nondisplaced injury. Otherwise no acute fracture or dislocation. Moderate joint space loss of the left hip with osteophytes. Mild on the right. Hyperostosis. Vascular calcifications are seen in the pelvis. There is a focal density in the tissues adjacent to the left greater trochanter. This is of uncertain etiology and was not present on the prior CT scan. Please correlate for artifact. IMPRESSION: Degenerative changes seen greatest of the left hip. On one view there is some subtle trabecular changes along the femoral neck. This could be technical rather than a true injury. If there is concern of fracture a CT or MRI may be of benefit to confirm a nondisplaced injury. Possible overlapping artifact or structure with a rounded density overlying the left hip. Please correlate with clinical findings. Electronically Signed   By: Jill Side M.D.   On: 10/21/2022 15:32   DG Lumbar Spine Complete  Result Date: 10/21/2022 CLINICAL DATA:  Chest pain and low back pain for 1 day. EXAM: LUMBAR SPINE -  COMPLETE 4+ VIEW COMPARISON:  September 26, 2022 FINDINGS: There is no evidence of lumbar spine fracture. Grade 1 anterolisthesis of L4 on L5 is identified unchanged. Postsurgical changes in the posterior sacrum unchanged. Mild degenerative joint changes with anterior spurring, narrow intervertebral space  is identified. IMPRESSION: No acute fracture or dislocation identified in the lumbar spine. Degenerative joint changes. Electronically Signed   By: Abelardo Diesel M.D.   On: 10/21/2022 13:14   DG Chest 2 View  Result Date: 10/21/2022 CLINICAL DATA:  Chest pain and low back pain for 1 day. EXAM: CHEST - 2 VIEW COMPARISON:  September 23, 2022 FINDINGS: The heart size and mediastinal contours are stable. Both lungs are clear. The visualized skeletal structures are stable. IMPRESSION: No active cardiopulmonary disease. Electronically Signed   By: Abelardo Diesel M.D.   On: 10/21/2022 13:12   CT ABDOMEN PELVIS WO CONTRAST  Result Date: 10/13/2022 CLINICAL DATA:  Abdominal pain, acute, nonlocalized EXAM: CT ABDOMEN AND PELVIS WITHOUT CONTRAST TECHNIQUE: Multidetector CT imaging of the abdomen and pelvis was performed following the standard protocol without IV contrast. RADIATION DOSE REDUCTION: This exam was performed according to the departmental dose-optimization program which includes automated exposure control, adjustment of the mA and/or kV according to patient size and/or use of iterative reconstruction technique. COMPARISON:  11/11/2021 FINDINGS: Lower chest: Extensive right coronary artery calcification. Hypoattenuation of the cardiac blood pool is in keeping with at least moderate anemia. Cardiac size within normal limits. Visualized lung bases are clear. Hepatobiliary: No focal liver abnormality is seen. No gallstones, gallbladder wall thickening, or biliary dilatation. Pancreas: Unremarkable Spleen: Unremarkable Adrenals/Urinary Tract: Adrenal glands are unremarkable. Kidneys are normal, without renal  calculi, focal lesion, or hydronephrosis. Bladder is unremarkable. Stomach/Bowel: The stomach, small bowel, and large bowel are unremarkable. No evidence of obstruction or focal inflammation. The appendix is not clearly identified and may be absent. No free intraperitoneal gas or fluid. Vascular/Lymphatic: Aortic atherosclerosis. No enlarged abdominal or pelvic lymph nodes. Reproductive: Surgical changes of trans urethral section are noted within the central prostate gland. The prostate gland is not enlarged. Other: Tiny fat containing umbilical hernia. Musculoskeletal: Posterior lumbar fusion L5-S1 noted with cerclage wires and spinous process clamps noted. Degenerative changes noted throughout the remainder of the thoracolumbar spine. No acute bone abnormality. No lytic or blastic bone lesion. IMPRESSION: 1. No acute intra-abdominal pathology identified. No definite radiographic explanation for the patient's reported symptoms. 2. Extensive right coronary artery calcification. 3. Hypoattenuation of the cardiac blood pool in keeping with at least moderate anemia. 4. Aortic atherosclerosis. Aortic Atherosclerosis (ICD10-I70.0). Electronically Signed   By: Fidela Salisbury M.D.   On: 10/13/2022 20:48   DG Abd Portable 1V  Result Date: 10/12/2022 CLINICAL DATA:  Abdominal tenderness EXAM: PORTABLE ABDOMEN - 1 VIEW COMPARISON:  None Available. FINDINGS: Lung bases are normal. No free air, portal venous gas, or pneumatosis. There is a paucity of bowel gas limiting evaluation but no evidence of obstruction or abnormal fecal loading. Phleboliths are identified in the pelvis. No other abnormalities. IMPRESSION: No cause for the patient's abdominal pain identified. Electronically Signed   By: Dorise Bullion III M.D.   On: 10/12/2022 15:56   DG Lumbar Spine 2-3 Views  Result Date: 09/26/2022 CLINICAL DATA:  Back pain with history of lumbar surgery EXAM: LUMBAR SPINE - 3 VIEW COMPARISON:  Abdominal radiograph dated  06/16/2022, CT abdomen and pelvis dated 09/02/2018 FINDINGS: Postsurgical changes from sacral spinous fixation. Hardware appears intact. There is no evidence of lumbar spine fracture. Unchanged grade 1 anterolisthesis of L4-5. Intervertebral disc spaces are maintained. IMPRESSION: Postsurgical changes from sacral spinous fixation. No evidence of lumbar spine fracture. Electronically Signed   By: Darrin Nipper M.D.   On: 09/26/2022 18:09     Subjective:  Patient seen examined at bedside, resting calmly.  Sleeping but easy arousable.  Renal function now markedly improved better than his most recent baseline.  Discussed with patient's unable to receive placement given his active substance abuse.  Did walk with mobility tech 100 feet today with a cane without much issue.   No other complaints or concerns at this time.  Denies headache, no dizziness, no chest pain, no shortness of breath, no abdominal pain, no fever/chills/night sweats, no nausea/vomiting/diarrhea.  No acute events overnight per nursing staff.  Discharging to [facility] treatment center for substance abuse via taxi and [place] bus.  Discharge Exam: Vitals:   10/24/22 2056 10/25/22 0515  BP:  (!) 178/95  Pulse:  74  Resp:  18  Temp:  98.5 F (36.9 C)  SpO2: 99% 99%   Vitals:   10/24/22 1758 10/24/22 2018 10/24/22 2056 10/25/22 0515  BP: (!) 159/81 (!) 147/69  (!) 178/95  Pulse: 69 81  74  Resp: '17 18  18  '$ Temp: 98.4 F (36.9 C) 98.2 F (36.8 C)  98.5 F (36.9 C)  TempSrc:  Oral  Oral  SpO2: 98% 99% 99% 99%  Weight:      Height:        Physical Exam: GEN: NAD, alert and oriented x 3, chronically ill in appearance, appears older than stated age HEENT: NCAT, PERRL, EOMI, sclera clear, MMM, poor dentition PULM: CTAB w/o wheezes/crackles, normal respiratory effort room air CV: RRR w/o M/G/R GI: abd soft, NTND, NABS, no R/G/M MSK: no peripheral edema, muscle strength globally intact 5/5 bilateral upper/lower extremities NEURO:  CN II-XII intact, no focal deficits, sensation to light touch intact PSYCH: Depressed mood, flat affect Integumentary: dry/intact, no rashes or wounds    The results of significant diagnostics from this hospitalization (including imaging, microbiology, ancillary and laboratory) are listed below for reference.     Microbiology: Recent Results (from the past 240 hour(s))  Resp panel by RT-PCR (RSV, Flu A&B, Covid) Anterior Nasal Swab     Status: None   Collection Time: 10/21/22 11:42 AM   Specimen: Anterior Nasal Swab  Result Value Ref Range Status   SARS Coronavirus 2 by RT PCR NEGATIVE NEGATIVE Final    Comment: (NOTE) SARS-CoV-2 target nucleic acids are NOT DETECTED.  The SARS-CoV-2 RNA is generally detectable in upper respiratory specimens during the acute phase of infection. The lowest concentration of SARS-CoV-2 viral copies this assay can detect is 138 copies/mL. A negative result does not preclude SARS-Cov-2 infection and should not be used as the sole basis for treatment or other patient management decisions. A negative result may occur with  improper specimen collection/handling, submission of specimen other than nasopharyngeal swab, presence of viral mutation(s) within the areas targeted by this assay, and inadequate number of viral copies(<138 copies/mL). A negative result must be combined with clinical observations, patient history, and epidemiological information. The expected result is Negative.  Fact Sheet for Patients:  EntrepreneurPulse.com.au  Fact Sheet for Healthcare Providers:  IncredibleEmployment.be  This test is no t yet approved or cleared by the [place] FDA and  has been authorized for detection and/or diagnosis of SARS-CoV-2 by FDA under an Emergency Use Authorization (EUA). This EUA will remain  in effect (meaning this test can be used) for the duration of the COVID-19 declaration under Section 564(b)(1) of  the Act, 21 U.S.C.section 360bbb-3(b)(1), unless the authorization is terminated  or revoked sooner.       Influenza A by PCR NEGATIVE NEGATIVE  Final   Influenza B by PCR NEGATIVE NEGATIVE Final    Comment: (NOTE) The Xpert Xpress SARS-CoV-2/FLU/RSV plus assay is intended as an aid in the diagnosis of influenza from Nasopharyngeal swab specimens and should not be used as a sole basis for treatment. Nasal washings and aspirates are unacceptable for Xpert Xpress SARS-CoV-2/FLU/RSV testing.  Fact Sheet for Patients: EntrepreneurPulse.com.au  Fact Sheet for Healthcare Providers: IncredibleEmployment.be  This test is not yet approved or cleared by the [place] FDA and has been authorized for detection and/or diagnosis of SARS-CoV-2 by FDA under an Emergency Use Authorization (EUA). This EUA will remain in effect (meaning this test can be used) for the duration of the COVID-19 declaration under Section 564(b)(1) of the Act, 21 U.S.C. section 360bbb-3(b)(1), unless the authorization is terminated or revoked.     Resp Syncytial Virus by PCR NEGATIVE NEGATIVE Final    Comment: (NOTE) Fact Sheet for Patients: EntrepreneurPulse.com.au  Fact Sheet for Healthcare Providers: IncredibleEmployment.be  This test is not yet approved or cleared by the [place] FDA and has been authorized for detection and/or diagnosis of SARS-CoV-2 by FDA under an Emergency Use Authorization (EUA). This EUA will remain in effect (meaning this test can be used) for the duration of the COVID-19 declaration under Section 564(b)(1) of the Act, 21 U.S.C. section 360bbb-3(b)(1), unless the authorization is terminated or revoked.  Performed at [facility], [place] [address] [place], [place] [address]      Labs: BNP (last 3 results) Recent Labs    07/02/22 0833  BNP 604.5*   Basic Metabolic Panel: Recent Labs  Lab  10/18/22 0824 10/21/22 0640 10/22/22 0456 10/23/22 0826 10/24/22 0640  NA 140 139 139 141 141  K 4.0 4.0 3.9 3.9 3.9  CL 111 111 114* 116* 116*  CO2 23 20* 18* 19* 18*  GLUCOSE 107* 117* 130* 119* 93  BUN 29* 58* 53* 34* 24*  CREATININE 3.24* 4.37* 3.55* 2.94* 2.66*  CALCIUM 8.7* 8.7* 7.8* 8.2* 8.3*   Liver Function Tests: Recent Labs  Lab 10/21/22 1127  AST 54*  ALT 51*  ALKPHOS 60  BILITOT 0.5  PROT 6.7  ALBUMIN 3.8   Recent Labs  Lab 10/21/22 1127  LIPASE 64*   No results for input(s): "AMMONIA" in the last 168 hours. CBC: Recent Labs  Lab 10/18/22 0824 10/21/22 0640 10/22/22 0456  WBC 3.6* 4.6 3.6*  NEUTROABS  --  3.2  --   HGB 7.8* 8.4* 7.3*  HCT 24.4* 25.7* 23.0*  MCV 89.1 88.3 88.5  PLT 97* 125* 100*   Cardiac Enzymes: Recent Labs  Lab 10/21/22 0640 10/22/22 0456 10/23/22 0826  CKTOTAL 1,916* 752* 474*   BNP: Invalid input(s): "POCBNP" CBG: No results for input(s): "GLUCAP" in the last 168 hours. D-Dimer No results for input(s): "DDIMER" in the last 72 hours.  Hgb A1c No results for input(s): "HGBA1C" in the last 72 hours. Lipid Profile No results for input(s): "CHOL", "HDL", "LDLCALC", "TRIG", "CHOLHDL", "LDLDIRECT" in the last 72 hours. Thyroid function studies No results for input(s): "TSH", "T4TOTAL", "T3FREE", "THYROIDAB" in the last 72 hours.  Invalid input(s): "FREET3" Anemia work up Recent Labs    10/22/22 1532  TIBC 209*  IRON 65  RETICCTPCT 1.3   Urinalysis    Component Value Date/Time   COLORURINE YELLOW 10/21/2022 [place] 10/21/2022 1318   LABSPEC 1.015 10/21/2022 1318   PHURINE 5.0 10/21/2022 1318   GLUCOSEU NEGATIVE 10/21/2022 1318   HGBUR  SMALL (A) 10/21/2022 1318   BILIRUBINUR NEGATIVE 10/21/2022 1318   KETONESUR NEGATIVE 10/21/2022 1318   PROTEINUR 100 (A) 10/21/2022 1318   UROBILINOGEN 0.2 08/13/2015 1030   NITRITE NEGATIVE 10/21/2022 1318   LEUKOCYTESUR NEGATIVE 10/21/2022 1318    Sepsis Labs Recent Labs  Lab 10/18/22 0824 10/21/22 0640 10/22/22 0456  WBC 3.6* 4.6 3.6*   Microbiology Recent Results (from the past 240 hour(s))  Resp panel by RT-PCR (RSV, Flu A&B, Covid) Anterior Nasal Swab     Status: None   Collection Time: 10/21/22 11:42 AM   Specimen: Anterior Nasal Swab  Result Value Ref Range Status   SARS Coronavirus 2 by RT PCR NEGATIVE NEGATIVE Final    Comment: (NOTE) SARS-CoV-2 target nucleic acids are NOT DETECTED.  The SARS-CoV-2 RNA is generally detectable in upper respiratory specimens during the acute phase of infection. The lowest concentration of SARS-CoV-2 viral copies this assay can detect is 138 copies/mL. A negative result does not preclude SARS-Cov-2 infection and should not be used as the sole basis for treatment or other patient management decisions. A negative result may occur with  improper specimen collection/handling, submission of specimen other than nasopharyngeal swab, presence of viral mutation(s) within the areas targeted by this assay, and inadequate number of viral copies(<138 copies/mL). A negative result must be combined with clinical observations, patient history, and epidemiological information. The expected result is Negative.  Fact Sheet for Patients:  EntrepreneurPulse.com.au  Fact Sheet for Healthcare Providers:  IncredibleEmployment.be  This test is no t yet approved or cleared by the [place] FDA and  has been authorized for detection and/or diagnosis of SARS-CoV-2 by FDA under an Emergency Use Authorization (EUA). This EUA will remain  in effect (meaning this test can be used) for the duration of the COVID-19 declaration under Section 564(b)(1) of the Act, 21 U.S.C.section 360bbb-3(b)(1), unless the authorization is terminated  or revoked sooner.       Influenza A by PCR NEGATIVE NEGATIVE Final   Influenza B by PCR NEGATIVE NEGATIVE Final    Comment:  (NOTE) The Xpert Xpress SARS-CoV-2/FLU/RSV plus assay is intended as an aid in the diagnosis of influenza from Nasopharyngeal swab specimens and should not be used as a sole basis for treatment. Nasal washings and aspirates are unacceptable for Xpert Xpress SARS-CoV-2/FLU/RSV testing.  Fact Sheet for Patients: EntrepreneurPulse.com.au  Fact Sheet for Healthcare Providers: IncredibleEmployment.be  This test is not yet approved or cleared by the [place] FDA and has been authorized for detection and/or diagnosis of SARS-CoV-2 by FDA under an Emergency Use Authorization (EUA). This EUA will remain in effect (meaning this test can be used) for the duration of the COVID-19 declaration under Section 564(b)(1) of the Act, 21 U.S.C. section 360bbb-3(b)(1), unless the authorization is terminated or revoked.     Resp Syncytial Virus by PCR NEGATIVE NEGATIVE Final    Comment: (NOTE) Fact Sheet for Patients: EntrepreneurPulse.com.au  Fact Sheet for Healthcare Providers: IncredibleEmployment.be  This test is not yet approved or cleared by the [place] FDA and has been authorized for detection and/or diagnosis of SARS-CoV-2 by FDA under an Emergency Use Authorization (EUA). This EUA will remain in effect (meaning this test can be used) for the duration of the COVID-19 declaration under Section 564(b)(1) of the Act, 21 U.S.C. section 360bbb-3(b)(1), unless the authorization is terminated or revoked.  Performed at [facility], [place] [address] [place], [place] [address]      Time coordinating discharge: Over 30 minutes  SIGNED:   Mattis Featherly J [place] DO  [facility] 10/25/2022, 6:55 AM

## 2022-10-25 ENCOUNTER — Other Ambulatory Visit (HOSPITAL_COMMUNITY): Payer: Self-pay

## 2022-10-25 DIAGNOSIS — N179 Acute kidney failure, unspecified: Secondary | ICD-10-CM | POA: Diagnosis not present

## 2022-10-25 NOTE — Care Management Important Message (Signed)
Important Message  Patient Details  Name: Sarath Privott MRN: [id number] Date of Birth: June 11, 1951   Medicare Important Message Given:  Yes  Patient left prior to IM delivery will mail to the patient home address.   Kamal Jurgens 10/25/2022, 2:23 PM

## 2022-10-25 NOTE — TOC Transition Note (Signed)
Transition of Care [facility]) - CM/SW Discharge Note   Patient Details  Name: Michael [name] MRN: [id number] Date of Birth: December 08, 1950  [facility] CM/SW Contact:  Milinda Antis, [place] Phone Number: 10/25/2022, 8:47 AM   Clinical Narrative:    Patient will DC to: [facility] Anticipated DC date: 10/25/2022 Transport by: [facility] and [place] bus   Per MD patient ready for DC to [place].   CSW will sign off for now as social work intervention is no longer needed. Please consult [place] again if new needs arise.   Final next level of care: Other (comment) Barriers to Discharge: Barriers Resolved   Patient Goals and CMS Choice      Discharge Placement                  Patient to be transferred to facility by: [facility] and [employer] Name of family member notified: Patient alert and oriented Patient and family notified of of transfer: 10/25/22  Discharge Plan and Services Additional resources added to the After Visit Summary for   In-house Referral: Clinical Social Work                                   Social Determinants of Health ([place]) Interventions SDOH Screenings   Food Insecurity: No Food Insecurity (10/22/2022)  Recent Concern: [place] Present (09/10/2022)  Housing: High Risk (10/22/2022)  Transportation Needs: Unmet Transportation Needs (10/22/2022)  Utilities: At Risk (10/22/2022)  Alcohol Screen: Low Risk  (06/15/2019)  Depression (PHQ2-9): High Risk (11/12/2021)  Financial Resource Strain: Medium Risk (09/16/2018)  Physical Activity: Unknown (04/22/2019)  Social Connections: Socially Isolated (09/16/2018)  Stress: Stress Concern Present (09/16/2018)  Tobacco Use: Low Risk  (10/21/2022)     Readmission Risk Interventions    07/05/2022   10:08 AM  Readmission Risk Prevention Plan  Transportation Screening Complete  Medication Review ([place]) Complete  PCP or Specialist  appointment within 3-5 days of discharge Complete  HRI or [place] Complete  SW Recovery Care/Counseling Consult Complete  [place] Not Applicable

## 2022-10-25 NOTE — Plan of Care (Signed)

## 2022-10-25 NOTE — Progress Notes (Signed)
Pt received meds from pharmacy and has them on his person. RN reviewed d/c instructions. Pt voices understanding of d/c plan. Pt taken to taxi via w/c by staff

## 2022-11-10 ENCOUNTER — Emergency Department (HOSPITAL_COMMUNITY)
Admission: EM | Admit: 2022-11-10 | Discharge: 2022-11-11 | Disposition: A | Discharge status: Home / Self Care | Payer: 59 | Attending: Emergency Medicine | Admitting: Emergency Medicine

## 2022-11-10 ENCOUNTER — Emergency Department (HOSPITAL_COMMUNITY): Payer: 59

## 2022-11-10 ENCOUNTER — Other Ambulatory Visit: Payer: Self-pay

## 2022-11-10 ENCOUNTER — Encounter (HOSPITAL_COMMUNITY): Payer: Self-pay | Admitting: Emergency Medicine

## 2022-11-10 DIAGNOSIS — Z7982 Long term (current) use of aspirin: Secondary | ICD-10-CM | POA: Diagnosis not present

## 2022-11-10 DIAGNOSIS — Z7984 Long term (current) use of oral hypoglycemic drugs: Secondary | ICD-10-CM | POA: Insufficient documentation

## 2022-11-10 DIAGNOSIS — Z23 Encounter for immunization: Secondary | ICD-10-CM | POA: Insufficient documentation

## 2022-11-10 DIAGNOSIS — M549 Dorsalgia, unspecified: Secondary | ICD-10-CM | POA: Insufficient documentation

## 2022-11-10 DIAGNOSIS — Z7951 Long term (current) use of inhaled steroids: Secondary | ICD-10-CM | POA: Diagnosis not present

## 2022-11-10 DIAGNOSIS — I129 Hypertensive chronic kidney disease with stage 1 through stage 4 chronic kidney disease, or unspecified chronic kidney disease: Secondary | ICD-10-CM | POA: Insufficient documentation

## 2022-11-10 DIAGNOSIS — Y9301 Activity, walking, marching and hiking: Secondary | ICD-10-CM | POA: Insufficient documentation

## 2022-11-10 DIAGNOSIS — Z21 Asymptomatic human immunodeficiency virus [HIV] infection status: Secondary | ICD-10-CM | POA: Insufficient documentation

## 2022-11-10 DIAGNOSIS — J45909 Unspecified asthma, uncomplicated: Secondary | ICD-10-CM | POA: Diagnosis not present

## 2022-11-10 DIAGNOSIS — Z794 Long term (current) use of insulin: Secondary | ICD-10-CM | POA: Diagnosis not present

## 2022-11-10 DIAGNOSIS — N179 Acute kidney failure, unspecified: Secondary | ICD-10-CM | POA: Diagnosis not present

## 2022-11-10 DIAGNOSIS — W19XXXA Unspecified fall, initial encounter: Secondary | ICD-10-CM | POA: Insufficient documentation

## 2022-11-10 DIAGNOSIS — S0990XA Unspecified injury of head, initial encounter: Secondary | ICD-10-CM | POA: Diagnosis present

## 2022-11-10 DIAGNOSIS — I2510 Atherosclerotic heart disease of native coronary artery without angina pectoris: Secondary | ICD-10-CM | POA: Insufficient documentation

## 2022-11-10 DIAGNOSIS — Z79899 Other long term (current) drug therapy: Secondary | ICD-10-CM | POA: Diagnosis not present

## 2022-11-10 DIAGNOSIS — N184 Chronic kidney disease, stage 4 (severe): Secondary | ICD-10-CM | POA: Diagnosis not present

## 2022-11-10 DIAGNOSIS — E119 Type 2 diabetes mellitus without complications: Secondary | ICD-10-CM | POA: Diagnosis not present

## 2022-11-10 DIAGNOSIS — R109 Unspecified abdominal pain: Secondary | ICD-10-CM | POA: Diagnosis not present

## 2022-11-10 DIAGNOSIS — S0181XA Laceration without foreign body of other part of head, initial encounter: Secondary | ICD-10-CM | POA: Diagnosis not present

## 2022-11-10 LAB — BASIC METABOLIC PANEL
Anion gap: 10 (ref 5–15)
BUN: 57 mg/dL — ABNORMAL HIGH (ref 8–23)
CO2: 18 mmol/L — ABNORMAL LOW (ref 22–32)
Calcium: 8.3 mg/dL — ABNORMAL LOW (ref 8.9–10.3)
Chloride: 109 mmol/L (ref 98–111)
Creatinine, Ser: 3.38 mg/dL — ABNORMAL HIGH (ref 0.61–1.24)
GFR, Estimated: 19 mL/min — ABNORMAL LOW (ref >60)
Glucose, Bld: 144 mg/dL — ABNORMAL HIGH (ref 70–99)
Potassium: 5 mmol/L (ref 3.5–5.1)
Sodium: 137 mmol/L (ref 135–145)

## 2022-11-10 LAB — CBC WITH DIFFERENTIAL/PLATELET
Abs Immature Granulocytes: 0.03 10*3/uL (ref 0.00–0.07)
Basophils Absolute: 0 10*3/uL (ref 0.0–0.1)
Basophils Relative: 0 %
Eosinophils Absolute: 0.1 10*3/uL (ref 0.0–0.5)
Eosinophils Relative: 1 %
HCT: 26.9 % — ABNORMAL LOW (ref 39.0–52.0)
Hemoglobin: 8.8 g/dL — ABNORMAL LOW (ref 13.0–17.0)
Immature Granulocytes: 1 %
Lymphocytes Relative: 14 %
Lymphs Abs: 0.7 10*3/uL (ref 0.7–4.0)
MCH: 29.1 pg (ref 26.0–34.0)
MCHC: 32.7 g/dL (ref 30.0–36.0)
MCV: 89.1 fL (ref 80.0–100.0)
Monocytes Absolute: 0.3 10*3/uL (ref 0.1–1.0)
Monocytes Relative: 6 %
Neutro Abs: 4.1 10*3/uL (ref 1.7–7.7)
Neutrophils Relative %: 78 %
Platelets: 153 10*3/uL (ref 150–400)
RBC: 3.02 MIL/uL — ABNORMAL LOW (ref 4.22–5.81)
RDW: 14.6 % (ref 11.5–15.5)
WBC: 5.2 10*3/uL (ref 4.0–10.5)
nRBC: 0 % (ref 0.0–0.2)

## 2022-11-10 MED ORDER — HYDROCODONE-ACETAMINOPHEN 5-325 MG PO TABS
1.0000 | ORAL_TABLET | Freq: Once | ORAL | Status: AC
  Administered 2022-11-10: 1 via ORAL
  Filled 2022-11-10: qty 1

## 2022-11-10 MED ORDER — LIDOCAINE-EPINEPHRINE (PF) 2 %-1:200000 IJ SOLN
10.0000 mL | Freq: Once | INTRAMUSCULAR | Status: DC
  Filled 2022-11-10: qty 20

## 2022-11-10 MED ORDER — TETANUS-DIPHTH-ACELL PERTUSSIS 5-2.5-18.5 LF-MCG/0.5 IM SUSY
0.5000 mL | PREFILLED_SYRINGE | Freq: Once | INTRAMUSCULAR | Status: AC
  Administered 2022-11-10: 0.5 mL via INTRAMUSCULAR
  Filled 2022-11-10: qty 0.5

## 2022-11-10 MED ORDER — LACTATED RINGERS IV BOLUS
1000.0000 mL | Freq: Once | INTRAVENOUS | Status: AC
  Administered 2022-11-11: 1000 mL via INTRAVENOUS

## 2022-11-10 MED ORDER — HYDRALAZINE HCL 25 MG PO TABS
100.0000 mg | ORAL_TABLET | Freq: Once | ORAL | Status: AC
  Administered 2022-11-10: 100 mg via ORAL
  Filled 2022-11-10: qty 4

## 2022-11-10 NOTE — ED Triage Notes (Signed)
Pt bib [facility] for mechanical fall onto concrete. No blood thinners, no loc.  A&ox4. Laceration noted to chin. C collar in place. Pt also c/o suicidal ideations x4 months without plan.

## 2022-11-10 NOTE — ED Provider Notes (Signed)
[place] Provider Note   CSN: [id number] Arrival date & time: 11/10/22  1707     History {Add pertinent medical, surgical, social history, OB history to HPI:1} Chief Complaint  Patient presents with   Michael [name] is a 72 y.o. male.  72 year old male presents today for evaluation following a mechanical fall.  He is not on blood thinners.  Denies loss of consciousness.  He states he was walking up a hill near a local police station when his legs suddenly gave out from underneath him causing him to fall.  He struck his chin where he has a small laceration.  He states this has been happening more frequently over the past 4 months.  He was recently at [facility] states he recently moved back.  He was recently admitted to the hospital for AKI.   The history is provided by the patient. No language interpreter was used.       Home Medications Prior to Admission medications   Medication Sig Start Date End Date Taking? Authorizing Provider  Accu-Chek Softclix Lancets lancets Use as directed up to four times daily 09/06/22   Elodia Florence., MD  albuterol (VENTOLIN HFA) 108 (90 Base) MCG/ACT inhaler Inhale 1-2 puffs into the lungs every 6 (six) hours as needed for wheezing or shortness of breath. 10/24/22   [place] Donnamarie Poag, DO  aspirin 81 MG chewable tablet Chew 1 tablet (81 mg total) by mouth daily. 10/24/22 11/23/22  [place] Donnamarie Poag, DO  atorvastatin (LIPITOR) 40 MG tablet Take 1 tablet (40 mg total) by mouth daily. 10/24/22 11/23/22  [place] Donnamarie Poag, DO  Blood Glucose Monitoring Suppl (ACCU-CHEK GUIDE) w/Device KIT Use as directed. 09/06/22   Elodia Florence., MD  budesonide-formoterol [facility]) 160-4.5 MCG/ACT inhaler Inhale 2 puffs into the lungs in the morning and at bedtime. 10/24/22   [place] Donnamarie Poag, DO  clopidogrel (PLAVIX) 75 MG tablet Take 1 tablet (75 mg total) by mouth daily. 10/24/22 11/23/22   [place] Donnamarie Poag, DO  diltiazem (CARDIZEM CD) 120 MG 24 hr capsule Take 1 capsule (120 mg total) by mouth daily. 10/24/22 11/23/22  [place] Eric J, DO  dolutegravir-rilpivirine (JULUCA) 50-25 MG tablet Take 1 tablet by mouth daily before lunch. 10/24/22 11/23/22  [place] Eric J, DO  ferrous sulfate 325 (65 FE) MG tablet Take 1 tablet (325 mg total) by mouth daily with breakfast. 10/24/22 11/23/22  [place] Donnamarie Poag, DO  FLUoxetine (PROZAC) 20 MG capsule Take 1 capsule (20 mg total) by mouth daily. 10/24/22 11/23/22  [place] Donnamarie Poag, DO  gabapentin (NEURONTIN) 300 MG capsule Take 1 capsule (300 mg total) by mouth at bedtime. 10/24/22 11/23/22  [place] Eric J, DO  glucose blood test strip use as directed up to four times daily 09/06/22   Elodia Florence., MD  hydrALAZINE (APRESOLINE) 50 MG tablet Take 1 tablet (50 mg total) by mouth every 8 (eight) hours. 10/24/22 11/23/22  [place] Eric J, DO  INCRUSE ELLIPTA 62.5 MCG/ACT AEPB Inhale 1 puff into the lungs daily. 10/24/22 11/23/22  [place] Donnamarie Poag, DO  Insulin Pen Needle 31G X 5 MM MISC Use with insulin pens Patient taking differently: 1 each by Other route See admin instructions. Use with insulin pens 10/24/21   [name], MD  Insulin Pen Needle 32G X 4 MM MISC Use as directed up to 4 times daily. 09/06/22   Elodia Florence.,  MD  isosorbide mononitrate (IMDUR) 30 MG 24 hr tablet Take 0.5 tablets (15 mg total) by mouth daily. 10/24/22 11/23/22  [place] Donnamarie Poag, DO  meclizine (ANTIVERT) 25 MG tablet Take 1 tablet (25 mg total) by mouth 3 (three) times daily as needed for dizziness. Patient not taking: Reported on 10/22/2022 10/18/22   Regalado, Jerald Kief A, MD  multivitamin (RENA-VIT) TABS tablet Take 1 tablet by mouth at bedtime. 10/24/22 11/23/22  [place] Donnamarie Poag, DO  nitroGLYCERIN (NITROSTAT) 0.4 MG SL tablet Place 1 tablet (0.4 mg total) under the tongue every 5 (five) minutes as needed for chest pain. Patient not taking: Reported on 10/22/2022 10/24/21    [name], MD  polyethylene glycol powder [facility]) 17 GM/SCOOP powder Take 17 g by mouth 2 (two) times daily. Patient not taking: Reported on 10/22/2022 10/18/22   Regalado, Jerald Kief A, MD  sodium bicarbonate 650 MG tablet Take 0.5 tablets (325 mg total) by mouth 2 (two) times daily. 10/24/22 11/23/22  [place] Eric J, DO  thiamine (VITAMIN B1) 100 MG tablet Take 1 tablet (100 mg total) by mouth daily. 10/24/22 11/23/22  [place] Eric J, DO      Allergies    Patient has no known allergies.    Review of Systems   Review of Systems  Constitutional:  Negative for fever.  Gastrointestinal:  Negative for constipation, diarrhea, nausea and vomiting.  Musculoskeletal:  Positive for arthralgias, back pain and neck pain. Negative for joint swelling.  Neurological:  Negative for syncope and light-headedness.  All other systems reviewed and are negative.   Physical Exam Updated Vital Signs BP (!) 188/106   Pulse 77   Temp 97.7 F (36.5 C) (Oral)   Resp 10   Ht '5\' 8"'$  (1.727 m)   Wt 74.4 kg   SpO2 100%   BMI 24.94 kg/m  Physical Exam Vitals and nursing note reviewed.  Constitutional:      General: He is not in acute distress.    Appearance: Normal appearance. He is ill-appearing (chronically ill appearing).  HENT:     Head: Normocephalic and atraumatic.     Nose: Nose normal.  Eyes:     General: No scleral icterus.    Extraocular Movements: Extraocular movements intact.     Conjunctiva/sclera: Conjunctivae normal.  Cardiovascular:     Rate and Rhythm: Normal rate and regular rhythm.     Pulses: Normal pulses.  Pulmonary:     Effort: Pulmonary effort is normal. No respiratory distress.     Breath sounds: Normal breath sounds. No wheezing or rales.  Abdominal:     General: There is no distension.     Palpations: Abdomen is soft.     Tenderness: There is abdominal tenderness. There is no right CVA tenderness, left CVA tenderness, guarding or rebound.  Musculoskeletal:         General: Normal range of motion.     Cervical back: Normal range of motion.     Right lower leg: No edema.     Left lower leg: No edema.     Comments: Cervical, thoracic, lumbar spine without tenderness palpation or step-offs.  Full range of motion in bilateral upper and lower extremities.  2+ DP pulse.  2+ radial pulse.  Skin:    General: Skin is warm and dry.  Neurological:     General: No focal deficit present.     Mental Status: He is alert. Mental status is at baseline.     ED Results / Procedures /  Treatments   Labs (all labs ordered are listed, but only abnormal results are displayed) Labs Reviewed  CBC WITH DIFFERENTIAL/PLATELET - Abnormal; Notable for the following components:      Result Value   RBC 3.02 (*)    Hemoglobin 8.8 (*)    HCT 26.9 (*)    All other components within normal limits  BASIC METABOLIC PANEL - Abnormal; Notable for the following components:   CO2 18 (*)    Glucose, Bld 144 (*)    BUN 57 (*)    Creatinine, Ser 3.38 (*)    Calcium 8.3 (*)    GFR, Estimated 19 (*)    All other components within normal limits    EKG None  Radiology CT L-SPINE NO CHARGE  Result Date: 11/10/2022 CLINICAL DATA:  [place], abdominal pain, prior back surgery EXAM: CT Lumbar Spine without contrast TECHNIQUE: Technique: Multiplanar CT images of the lumbar spine were reconstructed from contemporary CT of the Abdomen and Pelvis. RADIATION DOSE REDUCTION: This exam was performed according to the departmental dose-optimization program which includes automated exposure control, adjustment of the mA and/or kV according to patient size and/or use of iterative reconstruction technique. CONTRAST:  None COMPARISON:  10/21/2022 FINDINGS: Segmentation: 5 lumbar type vertebrae. Alignment: Stable grade 1 degenerative anterolisthesis of L4 on L5. Otherwise alignment is anatomic. Vertebrae: No acute fracture or focal pathologic process. Paraspinal and other soft tissues: Negative. Disc  levels: There is mild broad-based disc bulge and facet hypertrophy at L1-2 and L2-3. At L3-4 there is circumferential disc bulge and bilateral facet hypertrophy with mild central canal stenosis. At L4-5 there is circumferential disc bulge with significant bilateral facet and ligamentum flavum hypertrophic changes. Combined with the anterolisthesis described above, there is at least moderate central canal stenosis with symmetrical bilateral neural foraminal encroachment. At L5-S1 postsurgical changes are seen from prior bony fusion across the facet joints, with stable interspinous hardware and cerclage wires. Streak artifact limits evaluation of the central canal neural foramina. IMPRESSION: 1. No acute lumbar spine fracture. 2. Grade 1 anterolisthesis of L4 on L5, with significant spondylosis and facet hypertrophy resulting in at least moderate central canal stenosis at this level. More minimal spondylosis and facet hypertrophy at the remaining levels as above. 3. Stable postsurgical changes at the lumbosacral junction. Electronically Signed   By: Randa Ngo M.D.   On: 11/10/2022 18:44   CT ABDOMEN PELVIS WO CONTRAST  Result Date: 11/10/2022 CLINICAL DATA:  Abdominal pain, acute, nonlocalized EXAM: CT ABDOMEN AND PELVIS WITHOUT CONTRAST TECHNIQUE: Multidetector CT imaging of the abdomen and pelvis was performed following the standard protocol without IV contrast. Unenhanced CT was performed per clinician order. Lack of IV contrast limits sensitivity and specificity, especially for evaluation of abdominal/pelvic solid viscera. RADIATION DOSE REDUCTION: This exam was performed according to the departmental dose-optimization program which includes automated exposure control, adjustment of the mA and/or kV according to patient size and/or use of iterative reconstruction technique. COMPARISON:  10/13/2022 FINDINGS: Lower chest: No acute pleural or parenchymal lung disease. Occlusion device within the left atrial  appendage. Decreased attenuation of the blood pool consistent with anemia. Hepatobiliary: Unremarkable unenhanced appearance of the liver and gallbladder. Pancreas: Unremarkable unenhanced appearance. Spleen: Unremarkable unenhanced appearance. Adrenals/Urinary Tract: No urinary tract calculi or obstructive uropathy within either kidney. Bladder is moderately distended without filling defect. The adrenals are unremarkable. Stomach/Bowel: No bowel obstruction or ileus. The appendix is not well visualized. No bowel wall thickening or inflammatory change. Vascular/Lymphatic: Aortic atherosclerosis. No enlarged  abdominal or pelvic lymph nodes. Reproductive: Prior TURP defect again noted. Otherwise unremarkable prostate. Likely prior right orchiectomy. Other: No free fluid or free intraperitoneal gas. Small fat containing umbilical hernia. No bowel herniation. Musculoskeletal: No acute or destructive bony lesions. Reconstructed images demonstrate no additional findings. IMPRESSION: 1. Unremarkable unenhanced CT of the abdomen and pelvis. No abnormality to explain the patient's abdominal pain. 2.  Aortic Atherosclerosis (ICD10-I70.0). Electronically Signed   By: Randa Ngo M.D.   On: 11/10/2022 18:41   CT Head Wo Contrast  Result Date: 11/10/2022 CLINICAL DATA:  Trauma EXAM: CT HEAD WITHOUT CONTRAST CT CERVICAL SPINE WITHOUT CONTRAST TECHNIQUE: Multidetector CT imaging of the head and cervical spine was performed following the standard protocol without intravenous contrast. Multiplanar CT image reconstructions of the cervical spine were also generated. RADIATION DOSE REDUCTION: This exam was performed according to the departmental dose-optimization program which includes automated exposure control, adjustment of the mA and/or kV according to patient size and/or use of iterative reconstruction technique. COMPARISON:  MRI Head 11/01/21 FINDINGS: CT HEAD FINDINGS Brain: No evidence of acute infarction, hemorrhage,  hydrocephalus, extra-axial collection or mass lesion/mass effect. Unchanged small third ventricular lipoma. Vascular: No hyperdense vessel or unexpected calcification. Skull: Small soft tissue hematoma along the occipital scalp. No evidence of an underlying calvarial fracture. Sinuses/Orbits: No mastoid or middle ear effusion. Paranasal sinuses are clear. Orbits are unremarkable. Other: None. CT CERVICAL SPINE FINDINGS Alignment: Straightening of the normal cervical lordosis. Skull base and vertebrae: Postsurgical changes from C3-C7 ACDF. There is evidence of osseous fusion at the surgical levels. Hardware is intact. No evidence of perihardware lucency. Soft tissues and spinal canal: No prevertebral fluid or swelling. No visible canal hematoma. Disc levels: No evidence of high grade spinal canal stenosis. Upper chest: Negative. Other: None IMPRESSION: CT HEAD: 1. No acute intracranial abnormality. 2. Small soft tissue hematoma along the midline occipital scalp. No evidence of an underlying calvarial fracture. CT CERVICAL SPINE: 1. Postsurgical changes from C3-C7 ACDF. No evidence of hardware complication. 2. No acute cervical spine fracture or traumatic malalignment. Electronically Signed   By: Marin Roberts M.D.   On: 11/10/2022 18:38   CT Cervical Spine Wo Contrast  Result Date: 11/10/2022 CLINICAL DATA:  Trauma EXAM: CT HEAD WITHOUT CONTRAST CT CERVICAL SPINE WITHOUT CONTRAST TECHNIQUE: Multidetector CT imaging of the head and cervical spine was performed following the standard protocol without intravenous contrast. Multiplanar CT image reconstructions of the cervical spine were also generated. RADIATION DOSE REDUCTION: This exam was performed according to the departmental dose-optimization program which includes automated exposure control, adjustment of the mA and/or kV according to patient size and/or use of iterative reconstruction technique. COMPARISON:  MRI Head 11/01/21 FINDINGS: CT HEAD FINDINGS Brain:  No evidence of acute infarction, hemorrhage, hydrocephalus, extra-axial collection or mass lesion/mass effect. Unchanged small third ventricular lipoma. Vascular: No hyperdense vessel or unexpected calcification. Skull: Small soft tissue hematoma along the occipital scalp. No evidence of an underlying calvarial fracture. Sinuses/Orbits: No mastoid or middle ear effusion. Paranasal sinuses are clear. Orbits are unremarkable. Other: None. CT CERVICAL SPINE FINDINGS Alignment: Straightening of the normal cervical lordosis. Skull base and vertebrae: Postsurgical changes from C3-C7 ACDF. There is evidence of osseous fusion at the surgical levels. Hardware is intact. No evidence of perihardware lucency. Soft tissues and spinal canal: No prevertebral fluid or swelling. No visible canal hematoma. Disc levels: No evidence of high grade spinal canal stenosis. Upper chest: Negative. Other: None IMPRESSION: CT HEAD: 1. No acute intracranial  abnormality. 2. Small soft tissue hematoma along the midline occipital scalp. No evidence of an underlying calvarial fracture. CT CERVICAL SPINE: 1. Postsurgical changes from C3-C7 ACDF. No evidence of hardware complication. 2. No acute cervical spine fracture or traumatic malalignment. Electronically Signed   By: Marin Roberts M.D.   On: 11/10/2022 18:38   DG Knee Complete 4 Views Right  Result Date: 11/10/2022 CLINICAL DATA:  Pain and bruising after a fall. EXAM: RIGHT KNEE - COMPLETE 4+ VIEW COMPARISON:  None Available. FINDINGS: Right knee appears intact. No significant effusion. No evidence of acute fracture or subluxation. No focal bone lesion or bone destruction. Bone cortex and trabecular architecture appear intact. Mild soft tissue swelling over the suprapatellar region. Vascular calcifications. Surgical clips posteriorly. IMPRESSION: No acute bony abnormalities. Mild subcutaneous soft tissue swelling. Electronically Signed   By: Lucienne Capers M.D.   On: 11/10/2022 18:16    DG Knee Complete 4 Views Left  Result Date: 11/10/2022 CLINICAL DATA:  Pain and bruising after a fall. EXAM: LEFT KNEE - COMPLETE 4+ VIEW COMPARISON:  None Available. FINDINGS: Left knee appears intact. No significant effusion. No evidence of acute fracture or subluxation. No focal bone lesion or bone destruction. Bone cortex and trabecular architecture appear intact. Vascular calcifications. Surgical clips in the medial soft tissues. IMPRESSION: No acute bony abnormalities. Electronically Signed   By: Lucienne Capers M.D.   On: 11/10/2022 18:15    Procedures Procedures  {Document cardiac monitor, telemetry assessment procedure when appropriate:1}  Medications Ordered in ED Medications  Tdap (BOOSTRIX) injection 0.5 mL (has no administration in time range)  lidocaine-EPINEPHrine (XYLOCAINE W/EPI) 2 %-1:200000 (PF) injection 10 mL (has no administration in time range)  HYDROcodone-acetaminophen (NORCO/VICODIN) 5-325 MG per tablet 1 tablet (1 tablet Oral Given 11/10/22 1822)    ED Course/ Medical Decision Making/ A&P   {   Click here for ABCD2, HEART and other calculatorsREFRESH Note before signing :1}                          Medical Decision Making Amount and/or Complexity of Data Reviewed Labs: ordered. Radiology: ordered.  Risk Prescription drug management.   Medical Decision Making / ED Course   This patient presents to the ED for concern of fall, this involves an extensive number of treatment options, and is a complaint that carries with it a high risk of complications and morbidity.  The differential diagnosis includes intracranial injury, spinal fracture, joint fracture  MDM: 72 year old male presents today for evaluation above above-mentioned complaints.  He is chronically ill-appearing however in no acute distress.  He is a homeless man who recently moved from [facility] and currently stays with a friend.  He was walking on a hill when he had a fall.  No  loss of consciousness.  He is on aspirin and Plavix but no other blood thinner.  On exam he does have abdominal tenderness but no guarding or rebound.  He states this is chronic and has been ongoing for about 4 months.  Will obtain basic blood works, and imaging.  Small laceration noted to his chin with no active bleed.   Additional history obtained: -Additional history obtained from *** -External records from outside source obtained and reviewed including: Chart review including previous notes, labs, imaging, consultation notes   Lab Tests: -I ordered, reviewed, and interpreted labs.   The pertinent results include:   Labs Reviewed  CBC WITH DIFFERENTIAL/PLATELET - Abnormal; Notable  for the following components:      Result Value   RBC 3.02 (*)    Hemoglobin 8.8 (*)    HCT 26.9 (*)    All other components within normal limits  BASIC METABOLIC PANEL - Abnormal; Notable for the following components:   CO2 18 (*)    Glucose, Bld 144 (*)    BUN 57 (*)    Creatinine, Ser 3.38 (*)    Calcium 8.3 (*)    GFR, Estimated 19 (*)    All other components within normal limits      EKG  EKG Interpretation  Date/Time:    Ventricular Rate:    PR Interval:    QRS Duration:   QT Interval:    QTC Calculation:   R Axis:     Text Interpretation:           Imaging Studies ordered: I ordered imaging studies including *** I independently visualized and interpreted imaging. I agree with the radiologist interpretation   Medicines ordered and prescription drug management: Meds ordered this encounter  Medications   HYDROcodone-acetaminophen (NORCO/VICODIN) 5-325 MG per tablet 1 tablet   Tdap (BOOSTRIX) injection 0.5 mL   lidocaine-EPINEPHrine (XYLOCAINE W/EPI) 2 %-1:200000 (PF) injection 10 mL   hydrALAZINE (APRESOLINE) tablet 100 mg    -I have reviewed the patients home medicines and have made adjustments as needed  Critical interventions ***  Consultations Obtained: I  requested consultation with the ***,  and discussed lab and imaging findings as well as pertinent plan - they recommend: ***   Cardiac Monitoring: The patient was maintained on a cardiac monitor.  I personally viewed and interpreted the cardiac monitored which showed an underlying rhythm of: ***  Social Determinants of Health:  Factors impacting patients care include: ***   Reevaluation: After the interventions noted above, I reevaluated the patient and found that they have :{resolved/improved/worsened:23923::"improved"}  Co morbidities that complicate the patient evaluation  Past Medical History:  Diagnosis Date   A-fib ([place])    Anemia    Asthma    No PFTs, history of childhood asthma   CAD (coronary artery disease)    Cellulitis 04/2014   left facial   Cellulitis and abscess of toe of right foot 12/08/2019   Chondromalacia of medial femoral condyle    Left knee MRI 04/28/12: Chondromalacia of the medial femoral condyle with slight peripheral degeneration of the meniscocapsular junction of the medial meniscus; followed by sports medicine   Chronic kidney failure, stage 4 (severe) ([place])    Collagen vascular disease ([place])    Crack cocaine use    for 20+ years, has been enrolled in detox programs in the past   Depression    with history of hospitalization for suicidal ideation   Diabetes mellitus 2002   Diagnosed in 2002, started insulin in 2012   Gout    Headache(784.0)    CT head 08/2011: Periventricular and subcortical white matter hypodensities are most in keeping with chronic microangiopathic change   HIV infection ([place]) 08/2011   Followed by [name]   Hyperlipidemia    Hypertension    Pulmonary embolism ([place])       Dispostion: ***     Final Clinical Impression(s) / ED Diagnoses Final diagnoses:  None     '@PCDICTATION'$ @   {Document critical care time when appropriate:1} {Document review of labs and clinical decision tools ie heart score, Chads2Vasc2  etc:1}  {Document your independent review of radiology images, and any outside records:1} {Document  your discussion with family members, caretakers, and with consultants:1} {Document social determinants of health affecting pt's care:1} {Document your decision making why or why not admission, treatments were needed:1} Final Clinical Impression(s) / ED Diagnoses Final diagnoses:  None    Rx / DC Orders ED Discharge Orders     None

## 2022-11-10 NOTE — ED Provider Notes (Incomplete)
[facility] Provider Note   CSN: [id number] Arrival date & time: 11/10/22  1707     History {Add pertinent medical, surgical, social history, OB history to HPI:1} Chief Complaint  Patient presents with  . Fall    Michael [name] is a 72 y.o. male.  72 year old male presents today for evaluation following a mechanical fall.  He is not on blood thinners.  Denies loss of consciousness.  He states he was walking up a hill near a local police station when his legs suddenly gave out from underneath him causing him to fall.  He struck his chin where he has a small laceration.  He states this has been happening more frequently over the past 4 months.  He was recently at [facility] states he recently moved back.  He was recently admitted to the hospital for AKI.   The history is provided by the patient. No language interpreter was used.       Home Medications Prior to Admission medications   Medication Sig Start Date End Date Taking? Authorizing Provider  Accu-Chek Softclix Lancets lancets Use as directed up to four times daily 09/06/22   Elodia Florence., MD  albuterol (VENTOLIN HFA) 108 (90 Base) MCG/ACT inhaler Inhale 1-2 puffs into the lungs every 6 (six) hours as needed for wheezing or shortness of breath. 10/24/22   [place] Donnamarie Poag, DO  aspirin 81 MG chewable tablet Chew 1 tablet (81 mg total) by mouth daily. 10/24/22 11/23/22  [place] Donnamarie Poag, DO  atorvastatin (LIPITOR) 40 MG tablet Take 1 tablet (40 mg total) by mouth daily. 10/24/22 11/23/22  [place] Donnamarie Poag, DO  Blood Glucose Monitoring Suppl (ACCU-CHEK GUIDE) w/Device KIT Use as directed. 09/06/22   Elodia Florence., MD  budesonide-formoterol [facility]) 160-4.5 MCG/ACT inhaler Inhale 2 puffs into the lungs in the morning and at bedtime. 10/24/22   [place] Donnamarie Poag, DO  clopidogrel (PLAVIX) 75 MG tablet Take 1 tablet (75 mg total) by mouth daily. 10/24/22 11/23/22   [place] Donnamarie Poag, DO  diltiazem (CARDIZEM CD) 120 MG 24 hr capsule Take 1 capsule (120 mg total) by mouth daily. 10/24/22 11/23/22  [place] Eric J, DO  dolutegravir-rilpivirine (JULUCA) 50-25 MG tablet Take 1 tablet by mouth daily before lunch. 10/24/22 11/23/22  [place] Eric J, DO  ferrous sulfate 325 (65 FE) MG tablet Take 1 tablet (325 mg total) by mouth daily with breakfast. 10/24/22 11/23/22  [place] Donnamarie Poag, DO  FLUoxetine (PROZAC) 20 MG capsule Take 1 capsule (20 mg total) by mouth daily. 10/24/22 11/23/22  [place] Donnamarie Poag, DO  gabapentin (NEURONTIN) 300 MG capsule Take 1 capsule (300 mg total) by mouth at bedtime. 10/24/22 11/23/22  [place] Eric J, DO  glucose blood test strip use as directed up to four times daily 09/06/22   Elodia Florence., MD  hydrALAZINE (APRESOLINE) 50 MG tablet Take 1 tablet (50 mg total) by mouth every 8 (eight) hours. 10/24/22 11/23/22  [place] Eric J, DO  INCRUSE ELLIPTA 62.5 MCG/ACT AEPB Inhale 1 puff into the lungs daily. 10/24/22 11/23/22  [place] Donnamarie Poag, DO  Insulin Pen Needle 31G X 5 MM MISC Use with insulin pens Patient taking differently: 1 each by Other route See admin instructions. Use with insulin pens 10/24/21   [name], MD  Insulin Pen Needle 32G X 4 MM MISC Use as directed up to 4 times daily. 09/06/22   Elodia Florence.,  MD  isosorbide mononitrate (IMDUR) 30 MG 24 hr tablet Take 0.5 tablets (15 mg total) by mouth daily. 10/24/22 11/23/22  [place] Donnamarie Poag, DO  meclizine (ANTIVERT) 25 MG tablet Take 1 tablet (25 mg total) by mouth 3 (three) times daily as needed for dizziness. Patient not taking: Reported on 10/22/2022 10/18/22   Regalado, Jerald Kief A, MD  multivitamin (RENA-VIT) TABS tablet Take 1 tablet by mouth at bedtime. 10/24/22 11/23/22  [place] Donnamarie Poag, DO  nitroGLYCERIN (NITROSTAT) 0.4 MG SL tablet Place 1 tablet (0.4 mg total) under the tongue every 5 (five) minutes as needed for chest pain. Patient not taking: Reported on 10/22/2022 10/24/21    [name], MD  polyethylene glycol powder [facility]) 17 GM/SCOOP powder Take 17 g by mouth 2 (two) times daily. Patient not taking: Reported on 10/22/2022 10/18/22   Regalado, Jerald Kief A, MD  sodium bicarbonate 650 MG tablet Take 0.5 tablets (325 mg total) by mouth 2 (two) times daily. 10/24/22 11/23/22  [place] Eric J, DO  thiamine (VITAMIN B1) 100 MG tablet Take 1 tablet (100 mg total) by mouth daily. 10/24/22 11/23/22  [place] Eric J, DO      Allergies    Patient has no known allergies.    Review of Systems   Review of Systems  Constitutional:  Negative for fever.  Gastrointestinal:  Negative for constipation, diarrhea, nausea and vomiting.  Musculoskeletal:  Positive for arthralgias, back pain and neck pain. Negative for joint swelling.  Neurological:  Negative for syncope and light-headedness.  All other systems reviewed and are negative.   Physical Exam Updated Vital Signs BP (!) 188/106   Pulse 77   Temp 97.7 F (36.5 C) (Oral)   Resp 10   Ht '5\' 8"'$  (1.727 m)   Wt 74.4 kg   SpO2 100%   BMI 24.94 kg/m  Physical Exam Vitals and nursing note reviewed.  Constitutional:      General: He is not in acute distress.    Appearance: Normal appearance. He is ill-appearing (chronically ill appearing).  HENT:     Head: Normocephalic and atraumatic.     Nose: Nose normal.  Eyes:     General: No scleral icterus.    Extraocular Movements: Extraocular movements intact.     Conjunctiva/sclera: Conjunctivae normal.  Cardiovascular:     Rate and Rhythm: Normal rate and regular rhythm.     Pulses: Normal pulses.  Pulmonary:     Effort: Pulmonary effort is normal. No respiratory distress.     Breath sounds: Normal breath sounds. No wheezing or rales.  Abdominal:     General: There is no distension.     Palpations: Abdomen is soft.     Tenderness: There is abdominal tenderness. There is no right CVA tenderness, left CVA tenderness, guarding or rebound.  Musculoskeletal:         General: Normal range of motion.     Cervical back: Normal range of motion.     Right lower leg: No edema.     Left lower leg: No edema.     Comments: Cervical, thoracic, lumbar spine without tenderness palpation or step-offs.  Full range of motion in bilateral upper and lower extremities.  2+ DP pulse.  2+ radial pulse.  Skin:    General: Skin is warm and dry.  Neurological:     General: No focal deficit present.     Mental Status: He is alert. Mental status is at baseline.     ED Results / Procedures /  Treatments   Labs (all labs ordered are listed, but only abnormal results are displayed) Labs Reviewed  CBC WITH DIFFERENTIAL/PLATELET - Abnormal; Notable for the following components:      Result Value   RBC 3.02 (*)    Hemoglobin 8.8 (*)    HCT 26.9 (*)    All other components within normal limits  BASIC METABOLIC PANEL - Abnormal; Notable for the following components:   CO2 18 (*)    Glucose, Bld 144 (*)    BUN 57 (*)    Creatinine, Ser 3.38 (*)    Calcium 8.3 (*)    GFR, Estimated 19 (*)    All other components within normal limits    EKG None  Radiology CT L-SPINE NO CHARGE  Result Date: 11/10/2022 CLINICAL DATA:  [place], abdominal pain, prior back surgery EXAM: CT Lumbar Spine without contrast TECHNIQUE: Technique: Multiplanar CT images of the lumbar spine were reconstructed from contemporary CT of the Abdomen and Pelvis. RADIATION DOSE REDUCTION: This exam was performed according to the departmental dose-optimization program which includes automated exposure control, adjustment of the mA and/or kV according to patient size and/or use of iterative reconstruction technique. CONTRAST:  None COMPARISON:  10/21/2022 FINDINGS: Segmentation: 5 lumbar type vertebrae. Alignment: Stable grade 1 degenerative anterolisthesis of L4 on L5. Otherwise alignment is anatomic. Vertebrae: No acute fracture or focal pathologic process. Paraspinal and other soft tissues: Negative. Disc  levels: There is mild broad-based disc bulge and facet hypertrophy at L1-2 and L2-3. At L3-4 there is circumferential disc bulge and bilateral facet hypertrophy with mild central canal stenosis. At L4-5 there is circumferential disc bulge with significant bilateral facet and ligamentum flavum hypertrophic changes. Combined with the anterolisthesis described above, there is at least moderate central canal stenosis with symmetrical bilateral neural foraminal encroachment. At L5-S1 postsurgical changes are seen from prior bony fusion across the facet joints, with stable interspinous hardware and cerclage wires. Streak artifact limits evaluation of the central canal neural foramina. IMPRESSION: 1. No acute lumbar spine fracture. 2. Grade 1 anterolisthesis of L4 on L5, with significant spondylosis and facet hypertrophy resulting in at least moderate central canal stenosis at this level. More minimal spondylosis and facet hypertrophy at the remaining levels as above. 3. Stable postsurgical changes at the lumbosacral junction. Electronically Signed   By: Randa Ngo M.D.   On: 11/10/2022 18:44   CT ABDOMEN PELVIS WO CONTRAST  Result Date: 11/10/2022 CLINICAL DATA:  Abdominal pain, acute, nonlocalized EXAM: CT ABDOMEN AND PELVIS WITHOUT CONTRAST TECHNIQUE: Multidetector CT imaging of the abdomen and pelvis was performed following the standard protocol without IV contrast. Unenhanced CT was performed per clinician order. Lack of IV contrast limits sensitivity and specificity, especially for evaluation of abdominal/pelvic solid viscera. RADIATION DOSE REDUCTION: This exam was performed according to the departmental dose-optimization program which includes automated exposure control, adjustment of the mA and/or kV according to patient size and/or use of iterative reconstruction technique. COMPARISON:  10/13/2022 FINDINGS: Lower chest: No acute pleural or parenchymal lung disease. Occlusion device within the left atrial  appendage. Decreased attenuation of the blood pool consistent with anemia. Hepatobiliary: Unremarkable unenhanced appearance of the liver and gallbladder. Pancreas: Unremarkable unenhanced appearance. Spleen: Unremarkable unenhanced appearance. Adrenals/Urinary Tract: No urinary tract calculi or obstructive uropathy within either kidney. Bladder is moderately distended without filling defect. The adrenals are unremarkable. Stomach/Bowel: No bowel obstruction or ileus. The appendix is not well visualized. No bowel wall thickening or inflammatory change. Vascular/Lymphatic: Aortic atherosclerosis. No enlarged  abdominal or pelvic lymph nodes. Reproductive: Prior TURP defect again noted. Otherwise unremarkable prostate. Likely prior right orchiectomy. Other: No free fluid or free intraperitoneal gas. Small fat containing umbilical hernia. No bowel herniation. Musculoskeletal: No acute or destructive bony lesions. Reconstructed images demonstrate no additional findings. IMPRESSION: 1. Unremarkable unenhanced CT of the abdomen and pelvis. No abnormality to explain the patient's abdominal pain. 2.  Aortic Atherosclerosis (ICD10-I70.0). Electronically Signed   By: Randa Ngo M.D.   On: 11/10/2022 18:41   CT Head Wo Contrast  Result Date: 11/10/2022 CLINICAL DATA:  Trauma EXAM: CT HEAD WITHOUT CONTRAST CT CERVICAL SPINE WITHOUT CONTRAST TECHNIQUE: Multidetector CT imaging of the head and cervical spine was performed following the standard protocol without intravenous contrast. Multiplanar CT image reconstructions of the cervical spine were also generated. RADIATION DOSE REDUCTION: This exam was performed according to the departmental dose-optimization program which includes automated exposure control, adjustment of the mA and/or kV according to patient size and/or use of iterative reconstruction technique. COMPARISON:  MRI Head 11/01/21 FINDINGS: CT HEAD FINDINGS Brain: No evidence of acute infarction, hemorrhage,  hydrocephalus, extra-axial collection or mass lesion/mass effect. Unchanged small third ventricular lipoma. Vascular: No hyperdense vessel or unexpected calcification. Skull: Small soft tissue hematoma along the occipital scalp. No evidence of an underlying calvarial fracture. Sinuses/Orbits: No mastoid or middle ear effusion. Paranasal sinuses are clear. Orbits are unremarkable. Other: None. CT CERVICAL SPINE FINDINGS Alignment: Straightening of the normal cervical lordosis. Skull base and vertebrae: Postsurgical changes from C3-C7 ACDF. There is evidence of osseous fusion at the surgical levels. Hardware is intact. No evidence of perihardware lucency. Soft tissues and spinal canal: No prevertebral fluid or swelling. No visible canal hematoma. Disc levels: No evidence of high grade spinal canal stenosis. Upper chest: Negative. Other: None IMPRESSION: CT HEAD: 1. No acute intracranial abnormality. 2. Small soft tissue hematoma along the midline occipital scalp. No evidence of an underlying calvarial fracture. CT CERVICAL SPINE: 1. Postsurgical changes from C3-C7 ACDF. No evidence of hardware complication. 2. No acute cervical spine fracture or traumatic malalignment. Electronically Signed   By: Marin Roberts M.D.   On: 11/10/2022 18:38   CT Cervical Spine Wo Contrast  Result Date: 11/10/2022 CLINICAL DATA:  Trauma EXAM: CT HEAD WITHOUT CONTRAST CT CERVICAL SPINE WITHOUT CONTRAST TECHNIQUE: Multidetector CT imaging of the head and cervical spine was performed following the standard protocol without intravenous contrast. Multiplanar CT image reconstructions of the cervical spine were also generated. RADIATION DOSE REDUCTION: This exam was performed according to the departmental dose-optimization program which includes automated exposure control, adjustment of the mA and/or kV according to patient size and/or use of iterative reconstruction technique. COMPARISON:  MRI Head 11/01/21 FINDINGS: CT HEAD FINDINGS Brain:  No evidence of acute infarction, hemorrhage, hydrocephalus, extra-axial collection or mass lesion/mass effect. Unchanged small third ventricular lipoma. Vascular: No hyperdense vessel or unexpected calcification. Skull: Small soft tissue hematoma along the occipital scalp. No evidence of an underlying calvarial fracture. Sinuses/Orbits: No mastoid or middle ear effusion. Paranasal sinuses are clear. Orbits are unremarkable. Other: None. CT CERVICAL SPINE FINDINGS Alignment: Straightening of the normal cervical lordosis. Skull base and vertebrae: Postsurgical changes from C3-C7 ACDF. There is evidence of osseous fusion at the surgical levels. Hardware is intact. No evidence of perihardware lucency. Soft tissues and spinal canal: No prevertebral fluid or swelling. No visible canal hematoma. Disc levels: No evidence of high grade spinal canal stenosis. Upper chest: Negative. Other: None IMPRESSION: CT HEAD: 1. No acute intracranial  abnormality. 2. Small soft tissue hematoma along the midline occipital scalp. No evidence of an underlying calvarial fracture. CT CERVICAL SPINE: 1. Postsurgical changes from C3-C7 ACDF. No evidence of hardware complication. 2. No acute cervical spine fracture or traumatic malalignment. Electronically Signed   By: Marin Roberts M.D.   On: 11/10/2022 18:38   DG Knee Complete 4 Views Right  Result Date: 11/10/2022 CLINICAL DATA:  Pain and bruising after a fall. EXAM: RIGHT KNEE - COMPLETE 4+ VIEW COMPARISON:  None Available. FINDINGS: Right knee appears intact. No significant effusion. No evidence of acute fracture or subluxation. No focal bone lesion or bone destruction. Bone cortex and trabecular architecture appear intact. Mild soft tissue swelling over the suprapatellar region. Vascular calcifications. Surgical clips posteriorly. IMPRESSION: No acute bony abnormalities. Mild subcutaneous soft tissue swelling. Electronically Signed   By: Lucienne Capers M.D.   On: 11/10/2022 18:16    DG Knee Complete 4 Views Left  Result Date: 11/10/2022 CLINICAL DATA:  Pain and bruising after a fall. EXAM: LEFT KNEE - COMPLETE 4+ VIEW COMPARISON:  None Available. FINDINGS: Left knee appears intact. No significant effusion. No evidence of acute fracture or subluxation. No focal bone lesion or bone destruction. Bone cortex and trabecular architecture appear intact. Vascular calcifications. Surgical clips in the medial soft tissues. IMPRESSION: No acute bony abnormalities. Electronically Signed   By: Lucienne Capers M.D.   On: 11/10/2022 18:15    Procedures .Marland KitchenLaceration Repair  Date/Time: 11/10/2022 11:05 PM  Performed by: Evlyn Courier, PA-C Authorized by: Evlyn Courier, PA-C   Consent:    Consent obtained:  Verbal   Consent given by:  Patient   Risks discussed:  Infection, poor wound healing, need for additional repair and poor cosmetic result   Alternatives discussed:  No treatment Universal protocol:    Procedure explained and questions answered to patient or proxy's satisfaction: yes     Relevant documents present and verified: yes     Test results available: yes     Imaging studies available: yes     Patient identity confirmed:  Verbally with patient Anesthesia:    Anesthesia method:  None Laceration details:    Location:  Face   Face location:  Chin   Length (cm):  1 (1 cm jagged laceration) Pre-procedure details:    Preparation:  Patient was prepped and draped in usual sterile fashion Treatment:    Area cleansed with:  Saline   Amount of cleaning:  Standard   Irrigation volume:  250   Irrigation method:  Syringe and tap   Debridement:  None   Undermining:  None Skin repair:    Repair method:  Tissue adhesive Approximation:    Approximation:  Close Repair type:    Repair type:  Simple Post-procedure details:    Dressing:  Open (no dressing)   Procedure completion:  Tolerated well, no immediate complications   {Document cardiac monitor, telemetry assessment  procedure when appropriate:1}  Medications Ordered in ED Medications  Tdap (BOOSTRIX) injection 0.5 mL (has no administration in time range)  lidocaine-EPINEPHrine (XYLOCAINE W/EPI) 2 %-1:200000 (PF) injection 10 mL (has no administration in time range)  HYDROcodone-acetaminophen (NORCO/VICODIN) 5-325 MG per tablet 1 tablet (1 tablet Oral Given 11/10/22 1822)    ED Course/ Medical Decision Making/ A&P Clinical Course as of 11/10/22 2305  Sun Nov 10, 2022  2053 CT head, and cervical spine without acute intracranial or cervical spinal process.  CT abdomen pelvis without contrast without acute intra-abdominal finding to explain patient's  abdominal pain.  CT L-spine with circumferential disc bulge with significant bilateral facet and ligamentum flavum hypertrophic changes at L4-L5.  Combined with anterolisthesis.  Also at least moderate central canal stenosis with symmetrical bilateral neural foraminal encroachment.  Will further evaluate this with MRI lumbar spine without contrast.  Patient states he has surgery done to his low back last year.  This was done at [facility].  He has not seen his surgeon for quite some time.  CBC shows hemoglobin which is above patient's baseline.  No leukocytosis.  BMP shows creatinine of 3.38 which is somewhat uptrending from recent creatinine of May 2 from recent admission.  Potassium 5.0.  No anion gap.  Low suspicion for spinal epidural abscess as patient is afebrile, without tachycardia and without leukocytosis.  No other red flag signs or symptoms other than the CT L-spine findings.  No bowel or bladder dysfunction.  Discussed with attending who agrees with MRI lumbar without contrast. [AA]    Clinical Course User Index [AA] Evlyn Courier, PA-C   {   Click here for ABCD2, HEART and other calculatorsREFRESH Note before signing :1}                          Medical Decision Making Amount and/or Complexity of Data Reviewed Labs: ordered. Radiology:  ordered.  Risk Prescription drug management.   Medical Decision Making / ED Course   This patient presents to the ED for concern of fall, this involves an extensive number of treatment options, and is a complaint that carries with it a high risk of complications and morbidity.  The differential diagnosis includes intracranial injury, spinal fracture, joint fracture  MDM: 72 year old male presents today for evaluation above above-mentioned complaints.  He is chronically ill-appearing however in no acute distress.  He is a homeless man who recently moved from [facility] and currently stays with a friend.  He was walking on a hill when he had a fall.  No loss of consciousness.  He is on aspirin and Plavix but no other blood thinner.  On exam he does have abdominal tenderness but no guarding or rebound.  He states this is chronic and has been ongoing for about 4 months.  Will obtain basic blood works, and imaging.  Small laceration noted to his chin with no active bleed.   Additional history obtained: -Additional history obtained from *** -External records from outside source obtained and reviewed including: Chart review including previous notes, labs, imaging, consultation notes   Lab Tests: -I ordered, reviewed, and interpreted labs.   The pertinent results include:   Labs Reviewed  CBC WITH DIFFERENTIAL/PLATELET - Abnormal; Notable for the following components:      Result Value   RBC 3.02 (*)    Hemoglobin 8.8 (*)    HCT 26.9 (*)    All other components within normal limits  BASIC METABOLIC PANEL - Abnormal; Notable for the following components:   CO2 18 (*)    Glucose, Bld 144 (*)    BUN 57 (*)    Creatinine, Ser 3.38 (*)    Calcium 8.3 (*)    GFR, Estimated 19 (*)    All other components within normal limits      EKG  EKG Interpretation  Date/Time:    Ventricular Rate:    PR Interval:    QRS Duration:   QT Interval:    QTC Calculation:   R Axis:  Text Interpretation:           Imaging Studies ordered: I ordered imaging studies including *** I independently visualized and interpreted imaging. I agree with the radiologist interpretation   Medicines ordered and prescription drug management: Meds ordered this encounter  Medications  . HYDROcodone-acetaminophen (NORCO/VICODIN) 5-325 MG per tablet 1 tablet  . Tdap (BOOSTRIX) injection 0.5 mL  . lidocaine-EPINEPHrine (XYLOCAINE W/EPI) 2 %-1:200000 (PF) injection 10 mL  . hydrALAZINE (APRESOLINE) tablet 100 mg    -I have reviewed the patients home medicines and have made adjustments as needed  Critical interventions ***  Consultations Obtained: I requested consultation with the ***,  and discussed lab and imaging findings as well as pertinent plan - they recommend: ***   Cardiac Monitoring: The patient was maintained on a cardiac monitor.  I personally viewed and interpreted the cardiac monitored which showed an underlying rhythm of: ***  Social Determinants of Health:  Factors impacting patients care include: ***   Reevaluation: After the interventions noted above, I reevaluated the patient and found that they have :{resolved/improved/worsened:23923::"improved"}  Co morbidities that complicate the patient evaluation . Past Medical History:  Diagnosis Date  . A-fib ([place])   . Anemia   . Asthma    No PFTs, history of childhood asthma  . CAD (coronary artery disease)   . Cellulitis 04/2014   left facial  . Cellulitis and abscess of toe of right foot 12/08/2019  . Chondromalacia of medial femoral condyle    Left knee MRI 04/28/12: Chondromalacia of the medial femoral condyle with slight peripheral degeneration of the meniscocapsular junction of the medial meniscus; followed by sports medicine  . Chronic kidney failure, stage 4 (severe) ([name])   . Collagen vascular disease ([place])   . Crack cocaine use    for 20+ years, has been enrolled in detox programs in the past   . Depression    with history of hospitalization for suicidal ideation  . Diabetes mellitus 2002   Diagnosed in 2002, started insulin in 2012  . Gout   . Headache(784.0)    CT head 08/2011: Periventricular and subcortical white matter hypodensities are most in keeping with chronic microangiopathic change  . HIV infection ([place]) 08/2011   Followed by [name]  . Hyperlipidemia   . Hypertension   . Pulmonary embolism ([name])       Dispostion: ***    Final Clinical Impression(s) / ED Diagnoses Final diagnoses:  None    Rx / DC Orders ED Discharge Orders     None

## 2022-11-11 ENCOUNTER — Emergency Department (HOSPITAL_COMMUNITY): Payer: 59

## 2022-11-11 ENCOUNTER — Other Ambulatory Visit (HOSPITAL_COMMUNITY): Payer: Self-pay

## 2022-11-11 MED ORDER — PREDNISONE 10 MG PO TABS
30.0000 mg | ORAL_TABLET | Freq: Every day | ORAL | 0 refills | Status: AC
  Filled 2022-11-11: qty 15, 5d supply, fill #0

## 2022-11-11 NOTE — ED Provider Notes (Signed)
Received at shift change from Evlyn Courier, PA-C please see his note for full detail  In short patient with medical history including chronic heart failure with preserved ejection fraction, PAF, CAD status post CABG, CKD stage III, HIV on Elnoria Howard, cocaine dependency presents after mechanical fall, states that he was walking up a hill and his legs gave out, causing him to fall.  He states his legs giving out has been gone for the last 3 weeks, states that typically is his left leg, he states he will have intermittent paresthesias in his legs bilaterally, currently has none at this time, he denies any saddle paresthesias urinary or bowel incontinency's, his understanding headache change in vision paresthesias or weakness the upper or lower extremities.  Per previous provider follow-up on MRI of lumbar spine and discharge accordingly. Physical Exam  BP 133/72 (BP Location: Right Arm)   Pulse 76   Temp 97.9 F (36.6 C)   Resp 12   Ht '5\' 8"'$  (1.727 m)   Wt 74.4 kg   SpO2 100%   BMI 24.94 kg/m   Physical Exam Vitals and nursing note reviewed.  Constitutional:      General: He is not in acute distress.    Appearance: He is ill-appearing.     Comments: Disheveled, chronically ill-appearing  HENT:     Head: Normocephalic and atraumatic.     Comments: No deformity of the head present no raccoon eyes or Battle sign noted, small laceration on patient's chin.    Nose: No congestion.     Mouth/Throat:     Mouth: Mucous membranes are moist.     Pharynx: Oropharynx is clear.  Eyes:     Conjunctiva/sclera: Conjunctivae normal.  Cardiovascular:     Rate and Rhythm: Normal rate and regular rhythm.     Pulses: Normal pulses.     Heart sounds: No murmur heard.    No friction rub. No gallop.  Pulmonary:     Effort: No respiratory distress.     Breath sounds: No wheezing, rhonchi or rales.  Abdominal:     Palpations: Abdomen is soft.     Tenderness: There is no abdominal tenderness. There is no right CVA  tenderness or left CVA tenderness.  Musculoskeletal:     Comments: Spine was palpated and tender to palpation in his lower lumbar region, no step-off deformities noted no pelvis instability no leg shortening, patient has 5 out of 5 strength in the lower extremities, 2+ patellar reflexes bilaterally, sensation intact to light touch equally bilaterally, 2+ dorsal pedal.   Skin:    General: Skin is warm and dry.  Neurological:     Mental Status: He is alert.  Psychiatric:        Mood and Affect: Mood normal.     Procedures  Procedures  ED Course / MDM   Clinical Course as of 11/11/22 0304  Sun Nov 10, 2022  2053 CT head, and cervical spine without acute intracranial or cervical spinal process.  CT abdomen pelvis without contrast without acute intra-abdominal finding to explain patient's abdominal pain.  CT L-spine with circumferential disc bulge with significant bilateral facet and ligamentum flavum hypertrophic changes at L4-L5.  Combined with anterolisthesis.  Also at least moderate central canal stenosis with symmetrical bilateral neural foraminal encroachment.  Will further evaluate this with MRI lumbar spine without contrast.  Patient states he has surgery done to his low back last year.  This was done at [facility].  He has not seen his surgeon  for quite some time.  CBC shows hemoglobin which is above patient's baseline.  No leukocytosis.  BMP shows creatinine of 3.38 which is somewhat uptrending from recent creatinine of May 2 from recent admission.  Potassium 5.0.  No anion gap.  Low suspicion for spinal epidural abscess as patient is afebrile, without tachycardia and without leukocytosis.  No other red flag signs or symptoms other than the CT L-spine findings.  No bowel or bladder dysfunction.  Discussed with attending who agrees with MRI lumbar without contrast. [AA]    Clinical Course User Index [AA] Evlyn Courier, PA-C   Medical Decision Making Risk Prescription drug  management.     Lab Tests:  I Ordered, and personally interpreted labs.  The pertinent results include: CBC shows normocytic anemia hemoglobin 8.8 at baseline for patient, BMP shows CO2 of 18, glucose 144, BUN 57 creatinine 3.3 baseline appears to be around 2.5, GFR 19   Imaging Studies ordered:  I ordered imaging studies including CT head, CT C-spine, CT abdomen pelvis, CT lumbar spine, x-ray of the left and right knee I independently visualized and interpreted imaging which showed CT head C-spine both negative, CT abdomen pelvis also negative, CT lumbar spine reveals anterolisthesis of L4 on L5, DG of the knees bilaterally both negative, MRI negative acute findings I agree with the radiologist interpretation   Cardiac Monitoring:  The patient was maintained on a cardiac monitor.  I personally viewed and interpreted the cardiac monitored which showed an underlying rhythm of: N/A   Medicines ordered and prescription drug management:  I ordered medication including fluids I have reviewed the patients home medicines and have made adjustments as needed  Critical Interventions:  N/A   Reevaluation:  Presents after a fall, patient had a benign physical exam no red flag symptoms for spine equina, will await MRI for further evaluation.  Reassessed resting comfortably agreement discharge at this time   Consultations Obtained:  N/A    Test Considered:  N/A    Rule out I have low suspicion for spinal fracture or spinal cord abnormality as patient denies urinary incontinency, retention, difficulty with bowel movements, denies saddle paresthesias.  Spine was palpated there is no step-off, crepitus or gross deformities felt, patient had 5/5 strength, full range of motion, neurovascular fully intact in the lower extremities MRI is negative for acute findings.   Dispostion and problem list  After consideration of the diagnostic results and the patients response to treatment,  I feel that the patent would benefit from discharge.  Back pain-likely acute on chronic, will send patient for course of steroids, follow-up with neurosurgery for further evaluation AKI-patient was given liter of fluids while in the ER, will have him follow-up with his primary doctor for reassessment of his kidney function.           Marcello Fennel, PA-C 11/11/22 Nilda Riggs, April, MD 11/11/22 [id number]

## 2022-11-11 NOTE — Discharge Instructions (Addendum)
Back pain-likely strained muscle in your back, if starting on steroids please take as prescribed.  Please follow-up with neurosurgery for further evaluation Decreased kidney function-like to follow-up with your primary care doctor for further evaluation please do not take ibuprofen, naproxen, Goody powders this will worsen your kidney function.  Come back to the emergency department if you develop chest pain, shortness of breath, severe abdominal pain, uncontrolled nausea, vomiting, diarrhea.

## 2022-11-11 NOTE — ED Notes (Signed)
Pt in MRI.

## 2022-11-12 ENCOUNTER — Emergency Department (HOSPITAL_COMMUNITY)
Admission: EM | Admit: 2022-11-12 | Discharge: 2022-11-13 | Disposition: A | Discharge status: Home / Self Care | Payer: 59 | Attending: Emergency Medicine | Admitting: Emergency Medicine

## 2022-11-12 ENCOUNTER — Emergency Department (HOSPITAL_COMMUNITY): Payer: 59

## 2022-11-12 ENCOUNTER — Encounter (HOSPITAL_COMMUNITY): Payer: Self-pay | Admitting: Emergency Medicine

## 2022-11-12 DIAGNOSIS — N289 Disorder of kidney and ureter, unspecified: Secondary | ICD-10-CM | POA: Diagnosis not present

## 2022-11-12 DIAGNOSIS — J449 Chronic obstructive pulmonary disease, unspecified: Secondary | ICD-10-CM | POA: Insufficient documentation

## 2022-11-12 DIAGNOSIS — I130 Hypertensive heart and chronic kidney disease with heart failure and stage 1 through stage 4 chronic kidney disease, or unspecified chronic kidney disease: Secondary | ICD-10-CM | POA: Insufficient documentation

## 2022-11-12 DIAGNOSIS — F1410 Cocaine abuse, uncomplicated: Secondary | ICD-10-CM | POA: Insufficient documentation

## 2022-11-12 DIAGNOSIS — R45851 Suicidal ideations: Secondary | ICD-10-CM | POA: Diagnosis not present

## 2022-11-12 DIAGNOSIS — R296 Repeated falls: Secondary | ICD-10-CM | POA: Insufficient documentation

## 2022-11-12 DIAGNOSIS — Z1152 Encounter for screening for COVID-19: Secondary | ICD-10-CM | POA: Diagnosis not present

## 2022-11-12 DIAGNOSIS — M542 Cervicalgia: Secondary | ICD-10-CM | POA: Insufficient documentation

## 2022-11-12 DIAGNOSIS — Z794 Long term (current) use of insulin: Secondary | ICD-10-CM | POA: Insufficient documentation

## 2022-11-12 DIAGNOSIS — N184 Chronic kidney disease, stage 4 (severe): Secondary | ICD-10-CM | POA: Diagnosis not present

## 2022-11-12 DIAGNOSIS — Z79899 Other long term (current) drug therapy: Secondary | ICD-10-CM | POA: Diagnosis not present

## 2022-11-12 DIAGNOSIS — Z7902 Long term (current) use of antithrombotics/antiplatelets: Secondary | ICD-10-CM | POA: Diagnosis not present

## 2022-11-12 DIAGNOSIS — Z5900 Homelessness unspecified: Secondary | ICD-10-CM | POA: Insufficient documentation

## 2022-11-12 DIAGNOSIS — Z7951 Long term (current) use of inhaled steroids: Secondary | ICD-10-CM | POA: Diagnosis not present

## 2022-11-12 DIAGNOSIS — Z955 Presence of coronary angioplasty implant and graft: Secondary | ICD-10-CM | POA: Diagnosis not present

## 2022-11-12 DIAGNOSIS — R531 Weakness: Secondary | ICD-10-CM | POA: Diagnosis present

## 2022-11-12 DIAGNOSIS — I5030 Unspecified diastolic (congestive) heart failure: Secondary | ICD-10-CM | POA: Insufficient documentation

## 2022-11-12 DIAGNOSIS — J45909 Unspecified asthma, uncomplicated: Secondary | ICD-10-CM | POA: Diagnosis not present

## 2022-11-12 DIAGNOSIS — Z7982 Long term (current) use of aspirin: Secondary | ICD-10-CM | POA: Diagnosis not present

## 2022-11-12 DIAGNOSIS — I2510 Atherosclerotic heart disease of native coronary artery without angina pectoris: Secondary | ICD-10-CM | POA: Insufficient documentation

## 2022-11-12 DIAGNOSIS — R109 Unspecified abdominal pain: Secondary | ICD-10-CM | POA: Insufficient documentation

## 2022-11-12 DIAGNOSIS — M549 Dorsalgia, unspecified: Secondary | ICD-10-CM | POA: Diagnosis not present

## 2022-11-12 DIAGNOSIS — Z8616 Personal history of COVID-19: Secondary | ICD-10-CM | POA: Diagnosis not present

## 2022-11-12 DIAGNOSIS — Z21 Asymptomatic human immunodeficiency virus [HIV] infection status: Secondary | ICD-10-CM | POA: Insufficient documentation

## 2022-11-12 DIAGNOSIS — E1122 Type 2 diabetes mellitus with diabetic chronic kidney disease: Secondary | ICD-10-CM | POA: Insufficient documentation

## 2022-11-12 LAB — COMPREHENSIVE METABOLIC PANEL
ALT: 29 U/L (ref 0–44)
AST: 14 U/L — ABNORMAL LOW (ref 15–41)
Albumin: 4 g/dL (ref 3.5–5.0)
Alkaline Phosphatase: 70 U/L (ref 38–126)
Anion gap: 10 (ref 5–15)
BUN: 50 mg/dL — ABNORMAL HIGH (ref 8–23)
CO2: 19 mmol/L — ABNORMAL LOW (ref 22–32)
Calcium: 8.5 mg/dL — ABNORMAL LOW (ref 8.9–10.3)
Chloride: 112 mmol/L — ABNORMAL HIGH (ref 98–111)
Creatinine, Ser: 3.31 mg/dL — ABNORMAL HIGH (ref 0.61–1.24)
GFR, Estimated: 19 mL/min — ABNORMAL LOW (ref >60)
Glucose, Bld: 137 mg/dL — ABNORMAL HIGH (ref 70–99)
Potassium: 4.6 mmol/L (ref 3.5–5.1)
Sodium: 141 mmol/L (ref 135–145)
Total Bilirubin: 0.7 mg/dL (ref 0.3–1.2)
Total Protein: 7 g/dL (ref 6.5–8.1)

## 2022-11-12 LAB — RAPID URINE DRUG SCREEN, HOSP PERFORMED
Amphetamines: NOT DETECTED
Barbiturates: NOT DETECTED
Benzodiazepines: NOT DETECTED
Cocaine: POSITIVE — AB
Opiates: NOT DETECTED
Tetrahydrocannabinol: NOT DETECTED

## 2022-11-12 LAB — URINALYSIS, ROUTINE W REFLEX MICROSCOPIC
Bacteria, UA: NONE SEEN
Bilirubin Urine: NEGATIVE
Glucose, UA: 50 mg/dL — AB
Ketones, ur: 5 mg/dL — AB
Leukocytes,Ua: NEGATIVE
Nitrite: NEGATIVE
Protein, ur: >=300 mg/dL — AB
Specific Gravity, Urine: 1.013 (ref 1.005–1.030)
pH: 5 (ref 5.0–8.0)

## 2022-11-12 LAB — CBC WITH DIFFERENTIAL/PLATELET
Abs Immature Granulocytes: 0.03 10*3/uL (ref 0.00–0.07)
Basophils Absolute: 0 10*3/uL (ref 0.0–0.1)
Basophils Relative: 0 %
Eosinophils Absolute: 0.1 10*3/uL (ref 0.0–0.5)
Eosinophils Relative: 1 %
HCT: 28 % — ABNORMAL LOW (ref 39.0–52.0)
Hemoglobin: 8.9 g/dL — ABNORMAL LOW (ref 13.0–17.0)
Immature Granulocytes: 1 %
Lymphocytes Relative: 13 %
Lymphs Abs: 0.8 10*3/uL (ref 0.7–4.0)
MCH: 28.9 pg (ref 26.0–34.0)
MCHC: 31.8 g/dL (ref 30.0–36.0)
MCV: 90.9 fL (ref 80.0–100.0)
Monocytes Absolute: 0.4 10*3/uL (ref 0.1–1.0)
Monocytes Relative: 6 %
Neutro Abs: 4.8 10*3/uL (ref 1.7–7.7)
Neutrophils Relative %: 79 %
Platelets: 161 10*3/uL (ref 150–400)
RBC: 3.08 MIL/uL — ABNORMAL LOW (ref 4.22–5.81)
RDW: 14.8 % (ref 11.5–15.5)
WBC: 6.1 10*3/uL (ref 4.0–10.5)
nRBC: 0 % (ref 0.0–0.2)

## 2022-11-12 LAB — TROPONIN I (HIGH SENSITIVITY)
Troponin I (High Sensitivity): 61 ng/L — ABNORMAL HIGH (ref <18)
Troponin I (High Sensitivity): 61 ng/L — ABNORMAL HIGH (ref <18)

## 2022-11-12 LAB — CK: Total CK: 288 U/L (ref 49–397)

## 2022-11-12 LAB — ETHANOL: Alcohol, Ethyl (B): <10 mg/dL (ref <10)

## 2022-11-12 MED ORDER — HYDRALAZINE HCL 25 MG PO TABS
50.0000 mg | ORAL_TABLET | Freq: Three times a day (TID) | ORAL | Status: DC
  Administered 2022-11-12 – 2022-11-13 (×2): 50 mg via ORAL
  Filled 2022-11-12 (×2): qty 2

## 2022-11-12 MED ORDER — DILTIAZEM HCL ER COATED BEADS 120 MG PO CP24
120.0000 mg | ORAL_CAPSULE | Freq: Every day | ORAL | Status: DC
  Administered 2022-11-12 – 2022-11-13 (×2): 120 mg via ORAL
  Filled 2022-11-12 (×2): qty 1

## 2022-11-12 MED ORDER — CLOPIDOGREL BISULFATE 75 MG PO TABS
75.0000 mg | ORAL_TABLET | Freq: Every day | ORAL | Status: DC
  Administered 2022-11-12 – 2022-11-13 (×2): 75 mg via ORAL
  Filled 2022-11-12 (×2): qty 1

## 2022-11-12 MED ORDER — DOLUTEGRAVIR SODIUM 50 MG PO TABS
50.0000 mg | ORAL_TABLET | Freq: Every day | ORAL | Status: DC
  Filled 2022-11-12: qty 1

## 2022-11-12 MED ORDER — GABAPENTIN 300 MG PO CAPS
300.0000 mg | ORAL_CAPSULE | Freq: Every day | ORAL | Status: DC
  Administered 2022-11-12: 300 mg via ORAL
  Filled 2022-11-12: qty 1

## 2022-11-12 MED ORDER — ISOSORBIDE MONONITRATE ER 30 MG PO TB24
15.0000 mg | ORAL_TABLET | Freq: Every day | ORAL | Status: DC
  Administered 2022-11-12 – 2022-11-13 (×2): 15 mg via ORAL
  Filled 2022-11-12 (×2): qty 1

## 2022-11-12 MED ORDER — ALBUTEROL SULFATE (2.5 MG/3ML) 0.083% IN NEBU: 2.5000 mg | INHALATION_SOLUTION | Freq: Four times a day (QID) | RESPIRATORY_TRACT | Status: DC | PRN

## 2022-11-12 MED ORDER — HYDRALAZINE HCL 20 MG/ML IJ SOLN
10.0000 mg | Freq: Once | INTRAMUSCULAR | Status: AC
  Administered 2022-11-12: 10 mg via INTRAVENOUS
  Filled 2022-11-12: qty 1

## 2022-11-12 MED ORDER — FLUOXETINE HCL 20 MG PO CAPS
20.0000 mg | ORAL_CAPSULE | Freq: Every day | ORAL | Status: DC
  Administered 2022-11-12 – 2022-11-13 (×2): 20 mg via ORAL
  Filled 2022-11-12 (×2): qty 1

## 2022-11-12 MED ORDER — DOLUTEGRAVIR-RILPIVIRINE 50-25 MG PO TABS: 1.0000 | ORAL_TABLET | Freq: Every day | ORAL | Status: DC

## 2022-11-12 MED ORDER — SODIUM CHLORIDE 0.9 % IV BOLUS
1000.0000 mL | Freq: Once | INTRAVENOUS | Status: AC
  Administered 2022-11-12: 1000 mL via INTRAVENOUS

## 2022-11-12 MED ORDER — ALBUTEROL SULFATE HFA 108 (90 BASE) MCG/ACT IN AERS: 1.0000 | INHALATION_SPRAY | Freq: Four times a day (QID) | RESPIRATORY_TRACT | Status: DC | PRN

## 2022-11-12 MED ORDER — RILPIVIRINE HCL 25 MG PO TABS
25.0000 mg | ORAL_TABLET | Freq: Every day | ORAL | Status: DC
  Filled 2022-11-12: qty 1

## 2022-11-12 NOTE — NC FL2 (Signed)
[place] LEVEL OF CARE FORM     IDENTIFICATION  Patient Name: Michael [name] Birthdate: Mar 31, 1951 Sex: male Admission Date (Current Location): 11/12/2022  [facility] [place] Number:  Herbalist and Address:  [facility],  [place], [place]      Provider Number: [id number]  Attending Physician Name and Address:  Drenda Freeze, MD  Relative Name and Phone Number:       Current Level of Care: Hospital Recommended Level of Care: [place] Prior Approval Number: [id number]  Date Approved/Denied: 11/12/22 PASRR Number:    Discharge Plan:      Current Diagnoses: Patient Active Problem List   Diagnosis Date Noted   AKI (acute kidney injury) ([place]) 10/21/2022   Hyperkalemia 10/05/2022   CKD (chronic kidney disease), stage IV ([place]) 09/23/2022   Polysubstance abuse ([place]) 09/13/2022   ARF (acute renal failure) ([place]) 09/09/2022   (HFpEF) heart failure with preserved ejection fraction ([place]) 09/09/2022   Elevated troponin 09/09/2022   Impaired functional mobility, balance, and endurance 11/29/2021   Transfusion history 11/29/2021   Unstable balance 11/29/2021   Dilated cardiomyopathy ([place]) 11/29/2021   Arthritis 11/29/2021   Diabetic foot infection ([place]) 06/20/2021   Presence of aortocoronary bypass graft 04/16/2021   Iron deficiency anemia 04/04/2021   Atypical chest pain 04/03/2021   Anemia of chronic disease 12/14/2020   Type 2 diabetes mellitus with hyperlipidemia ([place]) 08/14/2020   Idiopathic chronic gout of multiple sites without tophus 02/17/2020   Diabetic ulcer of toe of right foot associated with type 2 diabetes mellitus ([place]) 12/17/2019   Vitamin B12 deficiency 12/17/2019   Homelessness 12/13/2019   Cellulitis 12/07/2019   Osteomyelitis ([place]) 07/17/2019   Abscess or cellulitis of toe, right    Toe pain, right-second    MDD (major depressive disorder), recurrent episode, severe ([place])  06/15/2019   Respiratory distress 04/12/2019   Pneumonia due to severe acute respiratory syndrome coronavirus 2 (SARS-CoV-2) 04/11/2019   COVID-19 virus infection 03/19/2019   Chest pain 10/03/2018   Malnutrition of moderate degree 09/21/2018   MDD (major depressive disorder), recurrent episode, moderate ([name])    Type II diabetes mellitus with renal manifestations ([place]) 05/04/2018   GERD (gastroesophageal reflux disease) 05/04/2018   S/P CABG (coronary artery bypass graft)    Left testicular pain    Depression 02/20/2018   Epididymo-orchitis, acute 02/20/2018   Essential hypertension 02/20/2018   Precordial chest pain 02/20/2018   Acute kidney injury superimposed on chronic kidney disease ([place]) 02/14/2018   HCAP (healthcare-associated pneumonia) 02/14/2018   History of pulmonary embolism 07/04/2017   Acute hyponatremia 05/11/2017   Hyperglycemia due to type 2 diabetes mellitus ([place]) 05/11/2017   CHF (congestive heart failure) ([place]) 04/01/2017   Hepatitis C 12/30/2016   Substance induced mood disorder ([place]) 11/23/2016   Chronic ischemic heart disease 11/12/2016   Paroxysmal A-fib ([place]) 11/12/2016   Back pain 04/18/2016   S/P carotid endarterectomy 11/15/2015   COPD (chronic obstructive pulmonary disease) ([place]) 10/22/2015   Stenosis of cervical spine with myelopathy ([place]) 10/20/2015   MDD (major depressive disorder), recurrent severe, without psychosis ([place]) 09/09/2015   History of fusion of cervical spine 08/28/2015   Cocaine-induced mood disorder ([place]) 08/14/2015   Cocaine abuse with cocaine-induced mood disorder ([place]) 08/14/2015   Gout 07/10/2015   Acute renal failure superimposed on stage 3 chronic kidney disease ([place]) 03/06/2015   Anemia 03/06/2015   Chronic kidney disease, stage III (moderate) ([place]) 03/06/2015  Hypoglycemia    Encounter for general adult medical examination with abnormal findings 02/09/2015   Cocaine use disorder, moderate, dependence ([place]) 12/13/2014    Substance or medication-induced depressive disorder with onset during withdrawal ([place]) 12/13/2014   Severe recurrent major depressive disorder with psychotic features ([place]) 12/12/2014   Major depressive disorder, recurrent, severe without psychotic features ([place])    Suicidal ideations 08/15/2014   Cervicalgia 06/28/2014   Lumbar radiculopathy, chronic 06/28/2014   Asthma, chronic 02/03/2014   S/P percutaneous transluminal coronary angioplasty 10/15/2013   3-vessel CAD 06/24/2013   ED (erectile dysfunction) of organic origin 07/07/2012   Cocaine abuse, continuous ([place]) 05/13/2012   Hypertension goal BP (blood pressure) < 140/80 04/29/2012   Chondromalacia of left knee 03/19/2012   Hyperlipidemia with target LDL less than 100 02/12/2012   Fibromyalgia 02/12/2012   Cocaine use disorder ([place]) 01/10/2012   Human immunodeficiency virus (HIV) disease ([place]) 12/16/2011   HIV (human immunodeficiency virus infection) ([place]) 08/27/2011   Uncontrolled type 2 diabetes with neuropathy 10/17/2000    Orientation RESPIRATION BLADDER Height & Weight     Self, Time, Situation, Place  Normal Continent Weight: 165 lb 5.5 oz (75 kg) Height:  '5\' 8"'$  (172.7 cm)  BEHAVIORAL SYMPTOMS/MOOD NEUROLOGICAL BOWEL NUTRITION STATUS      Continent    AMBULATORY STATUS COMMUNICATION OF NEEDS Skin   Limited Assist Verbally Normal                       Personal Care Assistance Level of Assistance  Bathing, Feeding, Dressing, Total care Bathing Assistance: Limited assistance Feeding assistance: Independent Dressing Assistance: Limited assistance Total Care Assistance: Limited assistance   Functional Limitations Info  Sight, Hearing, Speech Sight Info: Adequate Hearing Info: Adequate Speech Info: Adequate    SPECIAL CARE FACTORS FREQUENCY                       Contractures Contractures Info: Not present    Additional Factors Info  Code Status, Allergies Code Status Info: Prior Allergies Info:  N/A           Current Medications (11/12/2022):  This is the current hospital active medication list No current facility-administered medications for this encounter.   Current Outpatient Medications  Medication Sig Dispense Refill   Accu-Chek Softclix Lancets lancets Use as directed up to four times daily 100 each 0   albuterol (VENTOLIN HFA) 108 (90 Base) MCG/ACT inhaler Inhale 1-2 puffs into the lungs every 6 (six) hours as needed for wheezing or shortness of breath. 6.7 g 0   aspirin 81 MG chewable tablet Chew 1 tablet (81 mg total) by mouth daily. 30 tablet 0   atorvastatin (LIPITOR) 40 MG tablet Take 1 tablet (40 mg total) by mouth daily. 30 tablet 0   Blood Glucose Monitoring Suppl (ACCU-CHEK GUIDE) w/Device KIT Use as directed. 1 kit 0   budesonide-formoterol (SYMBICORT) 160-4.5 MCG/ACT inhaler Inhale 2 puffs into the lungs in the morning and at bedtime. 10.2 g 0   clopidogrel (PLAVIX) 75 MG tablet Take 1 tablet (75 mg total) by mouth daily. 30 tablet 0   diltiazem (CARDIZEM CD) 120 MG 24 hr capsule Take 1 capsule (120 mg total) by mouth daily. 30 capsule 0   dolutegravir-rilpivirine (JULUCA) 50-25 MG tablet Take 1 tablet by mouth daily before lunch. 30 tablet 0   ferrous sulfate 325 (65 FE) MG tablet Take 1 tablet (325 mg total) by mouth daily with breakfast. 30 tablet  0   FLUoxetine (PROZAC) 20 MG capsule Take 1 capsule (20 mg total) by mouth daily. 30 capsule 0   gabapentin (NEURONTIN) 300 MG capsule Take 1 capsule (300 mg total) by mouth at bedtime. 30 capsule 0   glucose blood test strip use as directed up to four times daily 100 each 0   hydrALAZINE (APRESOLINE) 50 MG tablet Take 1 tablet (50 mg total) by mouth every 8 (eight) hours. 90 tablet 0   INCRUSE ELLIPTA 62.5 MCG/ACT AEPB Inhale 1 puff into the lungs daily. 30 each 0   Insulin Pen Needle 31G X 5 MM MISC Use with insulin pens (Patient taking differently: 1 each by Other route See admin instructions. Use with insulin pens)  100 each 1   Insulin Pen Needle 32G X 4 MM MISC Use as directed up to 4 times daily. 100 each 0   isosorbide mononitrate (IMDUR) 30 MG 24 hr tablet Take 0.5 tablets (15 mg total) by mouth daily. 15 tablet 0   meclizine (ANTIVERT) 25 MG tablet Take 1 tablet (25 mg total) by mouth 3 (three) times daily as needed for dizziness. (Patient not taking: Reported on 10/22/2022) 30 tablet 0   multivitamin (RENA-VIT) TABS tablet Take 1 tablet by mouth at bedtime. 30 tablet 0   nitroGLYCERIN (NITROSTAT) 0.4 MG SL tablet Place 1 tablet (0.4 mg total) under the tongue every 5 (five) minutes as needed for chest pain. (Patient not taking: Reported on 10/22/2022) 25 tablet 0   polyethylene glycol powder (GLYCOLAX/MIRALAX) 17 GM/SCOOP powder Take 17 g by mouth 2 (two) times daily. (Patient not taking: Reported on 10/22/2022) 238 g 0   predniSONE (DELTASONE) 10 MG tablet Take 3 tablets (30 mg total) by mouth daily for 5 days. 15 tablet 0   sodium bicarbonate 650 MG tablet Take 0.5 tablets (325 mg total) by mouth 2 (two) times daily. 30 tablet 0   thiamine (VITAMIN B1) 100 MG tablet Take 1 tablet (100 mg total) by mouth daily. 30 tablet 0     Discharge Medications: Please see discharge summary for a list of discharge medications.  Relevant Imaging Results:  Relevant Lab Results:   Additional Information [place] Declynn Lopresti LCSW-A    FMM#[id number]  Rodney Booze, LCSW

## 2022-11-12 NOTE — ED Triage Notes (Signed)
Pt  here from outside with c/o multiple falls and weakness along with some htn , pt does admits to using some cocaine yesterday , alert and oriented on arrival

## 2022-11-12 NOTE — Progress Notes (Signed)
PT Cancellation Note  Patient Details Name: Michael [name] MRN: [id number] DOB: 1951-04-03   Cancelled Treatment:    Reason Eval/Treat Not Completed: Medical issues which prohibited therapy PT received imminent d/c orders. However noted pt here for 2 hr,  No MD note in from this admission yet, noted in nursing notes pt admitted with falls, pt with pelvis imaging pending.  Will f/u as able once imaging reports available.   Abran Richard, PT Acute Rehab [facility] [phone]  Michael [name] 11/12/2022, 4:17 PM

## 2022-11-12 NOTE — Evaluation (Signed)
Physical Therapy Evaluation Patient Details Name: Michael [name] MRN: [id number] DOB: 09-19-1951 Today's Date: 11/12/2022  History of Present Illness  Pt is 72 yo male presented to ED on 11/12/22 after fall.  Admits to using some cocaine 11/11/22, this visit CT face, cspine, head, and xray pelvis negative for acute injuries.  Pt also presented to ED 11/10/22 with fall in which that workup was negative for acute injury.  Pt also with several other recent admissions for falls and acute renal failure. His PMH includes but is not limited to afib, CAD, kidney failure, DM, gout, HIV, HTN, HLD, PE, and substance abuse.  Clinical Impression  Pt admitted with above diagnosis. Pt is familiar to PT services.  He was recently in the [place] program and was d/c on 10/18/22, he then returned again to hospital on 10/22/22, after that admission was planning on going to substance abuse rehab at [facility] but reports they did not keep him long b/c of his health.  Pt presented to ED on 11/11/22 and now on 11/12/22 with falls.  Pt with complex disposition due to drug use, homeless, and psych history - even though could benefit from SNF, will likely not be able to go to a SNF.  Pt also reports can no longer stay with his friend. Today, he was only able to ambulate 20' and required min A due to pain.  Pt had a cane previously, but it is now lost and has had frequent falls.  In previous admissions, pt has been able to advance to ambulating 100-400' with supervision.  Pt's blood pressure was elevated during session, but similar to values this admission-notified RN who was aware. Pt is below this baseline and will benefit from further therapy prior to d/c.  Pt currently with functional limitations due to the deficits listed below (see PT Problem List). Pt will benefit from skilled PT to increase their independence and safety with mobility to allow discharge to the venue listed below.          Recommendations for follow up therapy are one  component of a multi-disciplinary discharge planning process, led by the attending physician.  Recommendations may be updated based on patient status, additional functional criteria and insurance authorization.  Follow Up Recommendations Skilled nursing-short term rehab (<3 hours/day) (however is difficult to place) Can patient physically be transported by private vehicle: Yes    Assistance Recommended at Discharge Frequent or constant Supervision/Assistance  Patient can return home with the following  A little help with walking and/or transfers;A little help with bathing/dressing/bathroom;Assistance with cooking/housework;Help with stairs or ramp for entrance    Equipment Recommendations Rolling walker (2 wheels)  Recommendations for Other Services       Functional Status Assessment Patient has had a recent decline in their functional status and demonstrates the ability to make significant improvements in function in a reasonable and predictable amount of time.     Precautions / Restrictions Precautions Precautions: Fall Restrictions Other Position/Activity Restrictions: Marland Kitchen      Mobility  Bed Mobility Overal bed mobility: Needs Assistance Bed Mobility: Supine to Sit, Sit to Supine     Supine to sit: Min assist Sit to supine: Min assist   General bed mobility comments: increased time    Transfers Overall transfer level: Needs assistance Equipment used: 1 person hand held assist Transfers: Sit to/from Stand Sit to Stand: Min assist           General transfer comment: Min A to rise and steady  Ambulation/Gait Ambulation/Gait assistance: Min assist Gait Distance (Feet): 20 Feet Assistive device: 1 person hand held assist Gait Pattern/deviations: Step-to pattern, Trunk flexed, Decreased stride length, Antalgic Gait velocity: decreased     General Gait Details: Painful gait, min A for balance, tried HHA as pt no longer has his cane, limited distance due to  pain  Stairs            Wheelchair Mobility    Modified Rankin (Stroke Patients Only)       Balance Overall balance assessment: Needs assistance Sitting-balance support: No upper extremity supported, Feet supported Sitting balance-Leahy Scale: Good     Standing balance support: Single extremity supported Standing balance-Leahy Scale: Poor Standing balance comment: Requiring UE support and min A                             Pertinent Vitals/Pain Pain Assessment Pain Assessment: Faces Faces Pain Scale: Hurts even more Pain Location: Left hip and side; generalized Pain Descriptors / Indicators: Grimacing, Guarding, Sore Pain Intervention(s): Limited activity within patient's tolerance, Monitored during session    Home Living Family/patient expects to be discharged to:: Unsure     Type of Home: Homeless           Home Equipment: None Additional Comments: Recently was staying with friend but reports that is no longer option, sometimes stays at hotels, recently went to substance abuse rehab in [place] but reports "they didn't keep me long b/c of my health"    Prior Function Prior Level of Function : History of Falls (last six months)             Mobility Comments: Several falls; reports was using cane but not sure where it is located now.  Had eye patch that he was using during [facility] to help with dizziness but reports vision and dizziness have been better so not using/no longer has eye patch. ADLs Comments: MOD I ADLs, able to do basic household iADLs at [place]     Hand Dominance   Dominant Hand: Right    Extremity/Trunk Assessment   Upper Extremity Assessment Upper Extremity Assessment: Overall WFL for tasks assessed    Lower Extremity Assessment Lower Extremity Assessment: Generalized weakness RLE Deficits / Details: ROM WFL and MMT at least 3/5 but not further tested due to pain LLE Deficits / Details: ROM WFL and MMT at least 3/5  but not further tested due to pain    Cervical / Trunk Assessment Cervical / Trunk Assessment: Kyphotic  Communication   Communication: No difficulties  Cognition Arousal/Alertness: Awake/alert Behavior During Therapy: Flat affect Overall Cognitive Status: Within Functional Limits for tasks assessed                                 General Comments: likely at baseline, some decreased insight into health literacy        General Comments General comments (skin integrity, edema, etc.): Noted BP significantly elevated - RN reports has been this level.  Values 194/106 supine, 187/104 sitting, 181/109 post walk.  Noted was 200/105 at 1510    Exercises     Assessment/Plan    PT Assessment Patient needs continued PT services  PT Problem List Decreased strength;Decreased activity tolerance;Decreased balance;Decreased mobility;Decreased knowledge of use of DME;Impaired sensation;Decreased range of motion;Decreased coordination;Pain;Decreased safety awareness;Decreased knowledge of precautions       PT Treatment  Interventions DME instruction;Gait training;Functional mobility training;Therapeutic activities;Therapeutic exercise;Balance training;Neuromuscular re-education;Patient/family education;Stair training    PT Goals (Current goals can be found in the Care Plan section)  Acute Rehab PT Goals Patient Stated Goal: move better PT Goal Formulation: With patient Time For Goal Achievement: 11/26/22 Potential to Achieve Goals: Fair    Frequency Min 2X/week     Co-evaluation               AM-PAC PT "6 Clicks" Mobility  Outcome Measure Help needed turning from your back to your side while in a flat bed without using bedrails?: None Help needed moving from lying on your back to sitting on the side of a flat bed without using bedrails?: A Little Help needed moving to and from a bed to a chair (including a wheelchair)?: A Little Help needed standing up from a chair  using your arms (e.g., wheelchair or bedside chair)?: A Little Help needed to walk in hospital room?: A Little Help needed climbing 3-5 steps with a railing? : A Lot 6 Click Score: 18    End of Session Equipment Utilized During Treatment: Gait belt Activity Tolerance: Patient limited by pain Patient left: in bed;with call bell/phone within reach (ED stretcher) Nurse Communication: Mobility status PT Visit Diagnosis: Other abnormalities of gait and mobility (R26.89);History of falling (Z91.81)    Time: 1715-1735 PT Time Calculation (min) (ACUTE ONLY): 20 min   Charges:   PT Evaluation $PT Eval Low Complexity: 1 Low          Wafa Martes, PT Acute Rehab [facility] [phone]   Karlton Lemon 11/12/2022, 6:00 PM

## 2022-11-12 NOTE — ED Provider Notes (Signed)
[place] Provider Note   CSN: [id number] Arrival date & time: 11/12/22  1320     History  Chief Complaint  Patient presents with   Weakness    Michael [name] is a 72 y.o. male history of cocaine abuse, recent admission for acute renal failure, here presenting with recurrent fall.  Patient was seen here yesterday after a fall.  Patient had extensive workup including a full trauma scan and MRI of the lumbar spine.  He was not found to have any acute findings and his creatinine is around 3.5.  Patient was found walking in the park and had another fall and admits to continue to use cocaine.  Patient denies any chest pain.  The history is provided by the patient.       Home Medications Prior to Admission medications   Medication Sig Start Date End Date Taking? Authorizing Provider  Accu-Chek Softclix Lancets lancets Use as directed up to four times daily 09/06/22   Elodia Florence., MD  albuterol (VENTOLIN HFA) 108 (90 Base) MCG/ACT inhaler Inhale 1-2 puffs into the lungs every 6 (six) hours as needed for wheezing or shortness of breath. 10/24/22   [place] Donnamarie Poag, DO  aspirin 81 MG chewable tablet Chew 1 tablet (81 mg total) by mouth daily. 10/24/22 11/23/22  [place] Donnamarie Poag, DO  atorvastatin (LIPITOR) 40 MG tablet Take 1 tablet (40 mg total) by mouth daily. 10/24/22 11/23/22  [place] Donnamarie Poag, DO  Blood Glucose Monitoring Suppl (ACCU-CHEK GUIDE) w/Device KIT Use as directed. 09/06/22   Elodia Florence., MD  budesonide-formoterol [facility]) 160-4.5 MCG/ACT inhaler Inhale 2 puffs into the lungs in the morning and at bedtime. 10/24/22   [place] Donnamarie Poag, DO  clopidogrel (PLAVIX) 75 MG tablet Take 1 tablet (75 mg total) by mouth daily. 10/24/22 11/23/22  [place] Donnamarie Poag, DO  diltiazem (CARDIZEM CD) 120 MG 24 hr capsule Take 1 capsule (120 mg total) by mouth daily. 10/24/22 11/23/22  [place] Eric J, DO  dolutegravir-rilpivirine (JULUCA)  50-25 MG tablet Take 1 tablet by mouth daily before lunch. 10/24/22 11/23/22  [place] Eric J, DO  ferrous sulfate 325 (65 FE) MG tablet Take 1 tablet (325 mg total) by mouth daily with breakfast. 10/24/22 11/23/22  [place] Donnamarie Poag, DO  FLUoxetine (PROZAC) 20 MG capsule Take 1 capsule (20 mg total) by mouth daily. 10/24/22 11/23/22  [place] Donnamarie Poag, DO  gabapentin (NEURONTIN) 300 MG capsule Take 1 capsule (300 mg total) by mouth at bedtime. 10/24/22 11/23/22  [place] Eric J, DO  glucose blood test strip use as directed up to four times daily 09/06/22   Elodia Florence., MD  hydrALAZINE (APRESOLINE) 50 MG tablet Take 1 tablet (50 mg total) by mouth every 8 (eight) hours. 10/24/22 11/23/22  [place] Eric J, DO  INCRUSE ELLIPTA 62.5 MCG/ACT AEPB Inhale 1 puff into the lungs daily. 10/24/22 11/23/22  [place] Donnamarie Poag, DO  Insulin Pen Needle 31G X 5 MM MISC Use with insulin pens Patient taking differently: 1 each by Other route See admin instructions. Use with insulin pens 10/24/21   [name], MD  Insulin Pen Needle 32G X 4 MM MISC Use as directed up to 4 times daily. 09/06/22   Elodia Florence., MD  isosorbide mononitrate (IMDUR) 30 MG 24 hr tablet Take 0.5 tablets (15 mg total) by mouth daily. 10/24/22 11/23/22  [place] Donnamarie Poag, DO  meclizine (ANTIVERT) 25 MG tablet  Take 1 tablet (25 mg total) by mouth 3 (three) times daily as needed for dizziness. Patient not taking: Reported on 10/22/2022 10/18/22   Regalado, Jerald Kief A, MD  multivitamin (RENA-VIT) TABS tablet Take 1 tablet by mouth at bedtime. 10/24/22 11/23/22  [place] Donnamarie Poag, DO  nitroGLYCERIN (NITROSTAT) 0.4 MG SL tablet Place 1 tablet (0.4 mg total) under the tongue every 5 (five) minutes as needed for chest pain. Patient not taking: Reported on 10/22/2022 10/24/21   [name], MD  polyethylene glycol powder [facility]) 17 GM/SCOOP powder Take 17 g by mouth 2 (two) times daily. Patient not taking: Reported on 10/22/2022 10/18/22    Regalado, Jerald Kief A, MD  predniSONE (DELTASONE) 10 MG tablet Take 3 tablets (30 mg total) by mouth daily for 5 days. 11/11/22 11/16/22  Marcello Fennel, PA-C  sodium bicarbonate 650 MG tablet Take 0.5 tablets (325 mg total) by mouth 2 (two) times daily. 10/24/22 11/23/22  [place] Eric J, DO  thiamine (VITAMIN B1) 100 MG tablet Take 1 tablet (100 mg total) by mouth daily. 10/24/22 11/23/22  [place] Eric J, DO      Allergies    Patient has no known allergies.    Review of Systems   Review of Systems  Neurological:  Positive for weakness.  All other systems reviewed and are negative.   Physical Exam Updated Vital Signs There were no vitals taken for this visit. Physical Exam Vitals and nursing note reviewed.  Constitutional:      Comments: Chronically ill   HENT:     Head: Normocephalic.     Comments: MM dry     Nose: Nose normal.     Mouth/Throat:     Mouth: Mucous membranes are dry.  Eyes:     Extraocular Movements: Extraocular movements intact.     Pupils: Pupils are equal, round, and reactive to light.  Cardiovascular:     Rate and Rhythm: Normal rate and regular rhythm.     Pulses: Normal pulses.     Heart sounds: Normal heart sounds.  Pulmonary:     Effort: Pulmonary effort is normal.     Breath sounds: Normal breath sounds.  Abdominal:     General: Abdomen is flat.     Palpations: Abdomen is soft.  Musculoskeletal:        General: Normal range of motion.     Cervical back: Normal range of motion and neck supple.  Skin:    General: Skin is warm.     Capillary Refill: Capillary refill takes less than 2 seconds.  Neurological:     General: No focal deficit present.     Mental Status: He is alert and oriented to person, place, and time.     Comments: Strength is 4/5 bilateral arms and legs  Psychiatric:        Mood and Affect: Mood normal.        Behavior: Behavior normal.     ED Results / Procedures / Treatments   Labs (all labs ordered are listed, but only  abnormal results are displayed) Labs Reviewed  CBC WITH DIFFERENTIAL/PLATELET  COMPREHENSIVE METABOLIC PANEL  ETHANOL  CK  URINALYSIS, ROUTINE W REFLEX MICROSCOPIC  TROPONIN I (HIGH SENSITIVITY)  TROPONIN I (HIGH SENSITIVITY)    EKG None  Radiology DG Chest 2 View  Result Date: 11/12/2022 CLINICAL DATA:  Weakness and falls. EXAM: CHEST - 2 VIEW COMPARISON:  10/21/2022 FINDINGS: Coronary stents. Left atrial appendage clamp. Prior median sternotomy. Lower cervical plate and  screw fixator. The lungs appear clear. No blunting of the costophrenic angles. Atherosclerotic calcification of the aortic arch. Thoracic spondylosis. IMPRESSION: 1. No active cardiopulmonary disease is radiographically apparent. 2. Coronary stents and left atrial appendage clamp noted. 3. Thoracic spondylosis. Electronically Signed   By: Van Clines M.D.   On: 11/12/2022 14:27   MR LUMBAR SPINE WO CONTRAST  Result Date: 11/11/2022 CLINICAL DATA:  Low back pain EXAM: MRI LUMBAR SPINE WITHOUT CONTRAST TECHNIQUE: Multiplanar, multisequence MR imaging of the lumbar spine was performed. No intravenous contrast was administered. COMPARISON:  03/12/2016 FINDINGS: Segmentation:  Standard. Alignment:  grade 1 anterolisthesis at L4-5 Vertebrae: Susceptibility effects from surgical hardware at the sacrum. No fracture or other acute abnormality. Conus medullaris and cauda equina: Conus extends to the L1 level. Conus and cauda equina appear normal. Paraspinal and other soft tissues: Negative Disc levels: L1-L2: Normal disc space and facet joints. No spinal canal stenosis. No neural foraminal stenosis. L2-L3: Small disc bulge. No spinal canal stenosis. Unchanged moderate bilateral neural foraminal stenosis. L3-L4: Small disc bulge with endplate spurring. No spinal canal stenosis. Moderate bilateral neural foraminal stenosis. L4-L5: Severe facet arthrosis with disc uncovering and small bulge. Unchanged severe spinal canal stenosis.  Unchanged severe bilateral neural foraminal stenosis. L5-S1: Normal disc space and facet joints. No spinal canal stenosis. No neural foraminal stenosis. Visualized sacrum: Normal. IMPRESSION: 1. Unchanged severe spinal canal stenosis and bilateral neural foraminal stenosis at L4-L5 due to combination of disc bulge and severe facet arthrosis. 2. Unchanged moderate bilateral L2-3 and L3-4 neural foraminal stenosis. Electronically Signed   By: Ulyses Jarred M.D.   On: 11/11/2022 02:04   CT L-SPINE NO CHARGE  Result Date: 11/10/2022 CLINICAL DATA:  [place], abdominal pain, prior back surgery EXAM: CT Lumbar Spine without contrast TECHNIQUE: Technique: Multiplanar CT images of the lumbar spine were reconstructed from contemporary CT of the Abdomen and Pelvis. RADIATION DOSE REDUCTION: This exam was performed according to the departmental dose-optimization program which includes automated exposure control, adjustment of the mA and/or kV according to patient size and/or use of iterative reconstruction technique. CONTRAST:  None COMPARISON:  10/21/2022 FINDINGS: Segmentation: 5 lumbar type vertebrae. Alignment: Stable grade 1 degenerative anterolisthesis of L4 on L5. Otherwise alignment is anatomic. Vertebrae: No acute fracture or focal pathologic process. Paraspinal and other soft tissues: Negative. Disc levels: There is mild broad-based disc bulge and facet hypertrophy at L1-2 and L2-3. At L3-4 there is circumferential disc bulge and bilateral facet hypertrophy with mild central canal stenosis. At L4-5 there is circumferential disc bulge with significant bilateral facet and ligamentum flavum hypertrophic changes. Combined with the anterolisthesis described above, there is at least moderate central canal stenosis with symmetrical bilateral neural foraminal encroachment. At L5-S1 postsurgical changes are seen from prior bony fusion across the facet joints, with stable interspinous hardware and cerclage wires. Streak  artifact limits evaluation of the central canal neural foramina. IMPRESSION: 1. No acute lumbar spine fracture. 2. Grade 1 anterolisthesis of L4 on L5, with significant spondylosis and facet hypertrophy resulting in at least moderate central canal stenosis at this level. More minimal spondylosis and facet hypertrophy at the remaining levels as above. 3. Stable postsurgical changes at the lumbosacral junction. Electronically Signed   By: Randa Ngo M.D.   On: 11/10/2022 18:44   CT ABDOMEN PELVIS WO CONTRAST  Result Date: 11/10/2022 CLINICAL DATA:  Abdominal pain, acute, nonlocalized EXAM: CT ABDOMEN AND PELVIS WITHOUT CONTRAST TECHNIQUE: Multidetector CT imaging of the abdomen and pelvis was performed  following the standard protocol without IV contrast. Unenhanced CT was performed per clinician order. Lack of IV contrast limits sensitivity and specificity, especially for evaluation of abdominal/pelvic solid viscera. RADIATION DOSE REDUCTION: This exam was performed according to the departmental dose-optimization program which includes automated exposure control, adjustment of the mA and/or kV according to patient size and/or use of iterative reconstruction technique. COMPARISON:  10/13/2022 FINDINGS: Lower chest: No acute pleural or parenchymal lung disease. Occlusion device within the left atrial appendage. Decreased attenuation of the blood pool consistent with anemia. Hepatobiliary: Unremarkable unenhanced appearance of the liver and gallbladder. Pancreas: Unremarkable unenhanced appearance. Spleen: Unremarkable unenhanced appearance. Adrenals/Urinary Tract: No urinary tract calculi or obstructive uropathy within either kidney. Bladder is moderately distended without filling defect. The adrenals are unremarkable. Stomach/Bowel: No bowel obstruction or ileus. The appendix is not well visualized. No bowel wall thickening or inflammatory change. Vascular/Lymphatic: Aortic atherosclerosis. No enlarged abdominal  or pelvic lymph nodes. Reproductive: Prior TURP defect again noted. Otherwise unremarkable prostate. Likely prior right orchiectomy. Other: No free fluid or free intraperitoneal gas. Small fat containing umbilical hernia. No bowel herniation. Musculoskeletal: No acute or destructive bony lesions. Reconstructed images demonstrate no additional findings. IMPRESSION: 1. Unremarkable unenhanced CT of the abdomen and pelvis. No abnormality to explain the patient's abdominal pain. 2.  Aortic Atherosclerosis (ICD10-I70.0). Electronically Signed   By: Randa Ngo M.D.   On: 11/10/2022 18:41   CT Head Wo Contrast  Result Date: 11/10/2022 CLINICAL DATA:  Trauma EXAM: CT HEAD WITHOUT CONTRAST CT CERVICAL SPINE WITHOUT CONTRAST TECHNIQUE: Multidetector CT imaging of the head and cervical spine was performed following the standard protocol without intravenous contrast. Multiplanar CT image reconstructions of the cervical spine were also generated. RADIATION DOSE REDUCTION: This exam was performed according to the departmental dose-optimization program which includes automated exposure control, adjustment of the mA and/or kV according to patient size and/or use of iterative reconstruction technique. COMPARISON:  MRI Head 11/01/21 FINDINGS: CT HEAD FINDINGS Brain: No evidence of acute infarction, hemorrhage, hydrocephalus, extra-axial collection or mass lesion/mass effect. Unchanged small third ventricular lipoma. Vascular: No hyperdense vessel or unexpected calcification. Skull: Small soft tissue hematoma along the occipital scalp. No evidence of an underlying calvarial fracture. Sinuses/Orbits: No mastoid or middle ear effusion. Paranasal sinuses are clear. Orbits are unremarkable. Other: None. CT CERVICAL SPINE FINDINGS Alignment: Straightening of the normal cervical lordosis. Skull base and vertebrae: Postsurgical changes from C3-C7 ACDF. There is evidence of osseous fusion at the surgical levels. Hardware is intact. No  evidence of perihardware lucency. Soft tissues and spinal canal: No prevertebral fluid or swelling. No visible canal hematoma. Disc levels: No evidence of high grade spinal canal stenosis. Upper chest: Negative. Other: None IMPRESSION: CT HEAD: 1. No acute intracranial abnormality. 2. Small soft tissue hematoma along the midline occipital scalp. No evidence of an underlying calvarial fracture. CT CERVICAL SPINE: 1. Postsurgical changes from C3-C7 ACDF. No evidence of hardware complication. 2. No acute cervical spine fracture or traumatic malalignment. Electronically Signed   By: Marin Roberts M.D.   On: 11/10/2022 18:38   CT Cervical Spine Wo Contrast  Result Date: 11/10/2022 CLINICAL DATA:  Trauma EXAM: CT HEAD WITHOUT CONTRAST CT CERVICAL SPINE WITHOUT CONTRAST TECHNIQUE: Multidetector CT imaging of the head and cervical spine was performed following the standard protocol without intravenous contrast. Multiplanar CT image reconstructions of the cervical spine were also generated. RADIATION DOSE REDUCTION: This exam was performed according to the departmental dose-optimization program which includes automated exposure control, adjustment  of the mA and/or kV according to patient size and/or use of iterative reconstruction technique. COMPARISON:  MRI Head 11/01/21 FINDINGS: CT HEAD FINDINGS Brain: No evidence of acute infarction, hemorrhage, hydrocephalus, extra-axial collection or mass lesion/mass effect. Unchanged small third ventricular lipoma. Vascular: No hyperdense vessel or unexpected calcification. Skull: Small soft tissue hematoma along the occipital scalp. No evidence of an underlying calvarial fracture. Sinuses/Orbits: No mastoid or middle ear effusion. Paranasal sinuses are clear. Orbits are unremarkable. Other: None. CT CERVICAL SPINE FINDINGS Alignment: Straightening of the normal cervical lordosis. Skull base and vertebrae: Postsurgical changes from C3-C7 ACDF. There is evidence of osseous fusion at  the surgical levels. Hardware is intact. No evidence of perihardware lucency. Soft tissues and spinal canal: No prevertebral fluid or swelling. No visible canal hematoma. Disc levels: No evidence of high grade spinal canal stenosis. Upper chest: Negative. Other: None IMPRESSION: CT HEAD: 1. No acute intracranial abnormality. 2. Small soft tissue hematoma along the midline occipital scalp. No evidence of an underlying calvarial fracture. CT CERVICAL SPINE: 1. Postsurgical changes from C3-C7 ACDF. No evidence of hardware complication. 2. No acute cervical spine fracture or traumatic malalignment. Electronically Signed   By: Marin Roberts M.D.   On: 11/10/2022 18:38   DG Knee Complete 4 Views Right  Result Date: 11/10/2022 CLINICAL DATA:  Pain and bruising after a fall. EXAM: RIGHT KNEE - COMPLETE 4+ VIEW COMPARISON:  None Available. FINDINGS: Right knee appears intact. No significant effusion. No evidence of acute fracture or subluxation. No focal bone lesion or bone destruction. Bone cortex and trabecular architecture appear intact. Mild soft tissue swelling over the suprapatellar region. Vascular calcifications. Surgical clips posteriorly. IMPRESSION: No acute bony abnormalities. Mild subcutaneous soft tissue swelling. Electronically Signed   By: Lucienne Capers M.D.   On: 11/10/2022 18:16   DG Knee Complete 4 Views Left  Result Date: 11/10/2022 CLINICAL DATA:  Pain and bruising after a fall. EXAM: LEFT KNEE - COMPLETE 4+ VIEW COMPARISON:  None Available. FINDINGS: Left knee appears intact. No significant effusion. No evidence of acute fracture or subluxation. No focal bone lesion or bone destruction. Bone cortex and trabecular architecture appear intact. Vascular calcifications. Surgical clips in the medial soft tissues. IMPRESSION: No acute bony abnormalities. Electronically Signed   By: Lucienne Capers M.D.   On: 11/10/2022 18:15    Procedures Procedures    Medications Ordered in ED Medications   sodium chloride 0.9 % bolus 1,000 mL (1,000 mLs Intravenous New Bag/Given 11/12/22 1515)    ED Course/ Medical Decision Making/ A&P                             Medical Decision Making Jameir Ake is a 72 y.o. male here presenting with recurrent falls.  Patient unfortunately continues to use cocaine.  Patient just had extensive workup yesterday.  He also had an admission for renal failure recently.  Plan to recheck kidney function and get CT head and cervical spine since he did fall and hit his head.  Will also get UDS and troponin x 2.  6:39 PM Initial troponin was 61.  Repeat troponin is still 61.  Patient's creatinine is stable at 3.3 from yesterday.  CT head and cervical spine independently interpreted by me and showed no fracture or bleed.  Patient is hypertensive around 200.  Patient was given hydralazine and blood pressure went down to the 180s.  Physical therapy saw patient and recommended rehab.  I contacted social work regarding placement.  Problems Addressed: Cocaine abuse [facility]): chronic illness or injury Renal insufficiency: chronic illness or injury Weakness: chronic illness or injury  Amount and/or Complexity of Data Reviewed Labs: ordered. Radiology: ordered.  Risk Prescription drug management.    Final Clinical Impression(s) / ED Diagnoses Final diagnoses:  None    Rx / DC Orders ED Discharge Orders     None         Drenda Freeze, MD 11/12/22 [phone]

## 2022-11-12 NOTE — ED Notes (Signed)
Patient transported to CT 

## 2022-11-12 NOTE — Progress Notes (Addendum)
Transition of Care [facility]) - Emergency Department Mini Assessment   Patient Details  Name: Michael [name] MRN: [id number] Date of Birth: November 25, 1950  Transition of Care [facility]) CM/SW Contact:    Michael Booze, LCSW Phone Number: 11/12/2022, 6:57 PM   Clinical Narrative: CSW met the patient at bedside patient reports staying at friends home or [facility]. Patient is wanting to go to a SNF to become better. This CSW has completed FL2 this CSW has sent out the patient to numerous facilities. [name] II number is [phone]  The patient has stated that we ccan speak to his son about SNF etc. TOC will continue to follow the patients and present bed offers as they come.    ED Mini Assessment: What brought you to the Emergency Department? : (P) c/o multiple falls and weakness along with some htn , pt does admits to using some cocaine yesterday , alert and oriented on arrival  Barriers to Discharge: (P) No Barriers Identified        Interventions which prevented an admission or readmission: SNF Placement    Patient Contact and Communications        ,          Patient states their goals for this hospitalization and ongoing recovery are:: (P) Patient would like to go to a SNF per PT and providers rec CMS Medicare.gov Compare Post Acute Care list provided to:: (P) Patient    Admission diagnosis:  Weakness Hypertension Patient Active Problem List   Diagnosis Date Noted   AKI (acute kidney injury) ([place]) 10/21/2022   Hyperkalemia 10/05/2022   CKD (chronic kidney disease), stage IV ([place]) 09/23/2022   Polysubstance abuse ([place]) 09/13/2022   ARF (acute renal failure) ([place]) 09/09/2022   (HFpEF) heart failure with preserved ejection fraction ([place]) 09/09/2022   Elevated troponin 09/09/2022   Impaired functional mobility, balance, and endurance 11/29/2021   Transfusion history 11/29/2021   Unstable balance 11/29/2021   Dilated cardiomyopathy ([place]) 11/29/2021   Arthritis 11/29/2021    Diabetic foot infection ([place]) 06/20/2021   Presence of aortocoronary bypass graft 04/16/2021   Iron deficiency anemia 04/04/2021   Atypical chest pain 04/03/2021   Anemia of chronic disease 12/14/2020   Type 2 diabetes mellitus with hyperlipidemia ([place]) 08/14/2020   Idiopathic chronic gout of multiple sites without tophus 02/17/2020   Diabetic ulcer of toe of right foot associated with type 2 diabetes mellitus ([place]) 12/17/2019   Vitamin B12 deficiency 12/17/2019   Homelessness 12/13/2019   Cellulitis 12/07/2019   Osteomyelitis ([place]) 07/17/2019   Abscess or cellulitis of toe, right    Toe pain, right-second    MDD (major depressive disorder), recurrent episode, severe ([place]) 06/15/2019   Respiratory distress 04/12/2019   Pneumonia due to severe acute respiratory syndrome coronavirus 2 (SARS-CoV-2) 04/11/2019   COVID-19 virus infection 03/19/2019   Chest pain 10/03/2018   Malnutrition of moderate degree 09/21/2018   MDD (major depressive disorder), recurrent episode, moderate ([name])    Type II diabetes mellitus with renal manifestations ([place]) 05/04/2018   GERD (gastroesophageal reflux disease) 05/04/2018   S/P CABG (coronary artery bypass graft)    Left testicular pain    Depression 02/20/2018   Epididymo-orchitis, acute 02/20/2018   Essential hypertension 02/20/2018   Precordial chest pain 02/20/2018   Acute kidney injury superimposed on chronic kidney disease ([place]) 02/14/2018   HCAP (healthcare-associated pneumonia) 02/14/2018   History of pulmonary embolism 07/04/2017   Acute hyponatremia 05/11/2017   Hyperglycemia due to type 2 diabetes mellitus (  [place]) 05/11/2017   CHF (congestive heart failure) ([place]) 04/01/2017   Hepatitis C 12/30/2016   Substance induced mood disorder ([place]) 11/23/2016   Chronic ischemic heart disease 11/12/2016   Paroxysmal A-fib ([place]) 11/12/2016   Back pain 04/18/2016   S/P carotid endarterectomy 11/15/2015   COPD (chronic obstructive pulmonary disease)  ([place]) 10/22/2015   Stenosis of cervical spine with myelopathy ([place]) 10/20/2015   MDD (major depressive disorder), recurrent severe, without psychosis ([place]) 09/09/2015   History of fusion of cervical spine 08/28/2015   Cocaine-induced mood disorder ([place]) 08/14/2015   Cocaine abuse with cocaine-induced mood disorder ([place]) 08/14/2015   Gout 07/10/2015   Acute renal failure superimposed on stage 3 chronic kidney disease ([place]) 03/06/2015   Anemia 03/06/2015   Chronic kidney disease, stage III (moderate) ([place]) 03/06/2015   Hypoglycemia    Encounter for general adult medical examination with abnormal findings 02/09/2015   Cocaine use disorder, moderate, dependence ([place]) 12/13/2014   Substance or medication-induced depressive disorder with onset during withdrawal ([place]) 12/13/2014   Severe recurrent major depressive disorder with psychotic features ([place]) 12/12/2014   Major depressive disorder, recurrent, severe without psychotic features ([place])    Suicidal ideations 08/15/2014   Cervicalgia 06/28/2014   Lumbar radiculopathy, chronic 06/28/2014   Asthma, chronic 02/03/2014   S/P percutaneous transluminal coronary angioplasty 10/15/2013   3-vessel CAD 06/24/2013   ED (erectile dysfunction) of organic origin 07/07/2012   Cocaine abuse, continuous ([place]) 05/13/2012   Hypertension goal BP (blood pressure) < 140/80 04/29/2012   Chondromalacia of left knee 03/19/2012   Hyperlipidemia with target LDL less than 100 02/12/2012   Fibromyalgia 02/12/2012   Cocaine use disorder ([place]) 01/10/2012    Class: Acute   Human immunodeficiency virus (HIV) disease ([place]) 12/16/2011   HIV (human immunodeficiency virus infection) ([place]) 08/27/2011   Uncontrolled type 2 diabetes with neuropathy 10/17/2000   PCP:  [name], MD Pharmacy:   [name] [address] [place] [address] Phone: [phone] Fax: [place] [address]  [place] [address] Phone: [phone] Fax: [place], [place] [address] [place] [address] Phone: [phone] Fax: [phone]

## 2022-11-13 ENCOUNTER — Emergency Department (EMERGENCY_DEPARTMENT_HOSPITAL)
Admission: EM | Admit: 2022-11-13 | Discharge: 2022-11-15 | Disposition: A | Discharge status: Home / Self Care | Payer: 59 | Source: Home / Self Care | Attending: Emergency Medicine | Admitting: Emergency Medicine

## 2022-11-13 DIAGNOSIS — J45909 Unspecified asthma, uncomplicated: Secondary | ICD-10-CM | POA: Insufficient documentation

## 2022-11-13 DIAGNOSIS — I2510 Atherosclerotic heart disease of native coronary artery without angina pectoris: Secondary | ICD-10-CM | POA: Insufficient documentation

## 2022-11-13 DIAGNOSIS — I130 Hypertensive heart and chronic kidney disease with heart failure and stage 1 through stage 4 chronic kidney disease, or unspecified chronic kidney disease: Secondary | ICD-10-CM | POA: Insufficient documentation

## 2022-11-13 DIAGNOSIS — Z7982 Long term (current) use of aspirin: Secondary | ICD-10-CM | POA: Insufficient documentation

## 2022-11-13 DIAGNOSIS — M542 Cervicalgia: Secondary | ICD-10-CM | POA: Insufficient documentation

## 2022-11-13 DIAGNOSIS — N184 Chronic kidney disease, stage 4 (severe): Secondary | ICD-10-CM | POA: Insufficient documentation

## 2022-11-13 DIAGNOSIS — Z20822 Contact with and (suspected) exposure to covid-19: Secondary | ICD-10-CM | POA: Insufficient documentation

## 2022-11-13 DIAGNOSIS — M549 Dorsalgia, unspecified: Secondary | ICD-10-CM | POA: Insufficient documentation

## 2022-11-13 DIAGNOSIS — Z21 Asymptomatic human immunodeficiency virus [HIV] infection status: Secondary | ICD-10-CM | POA: Insufficient documentation

## 2022-11-13 DIAGNOSIS — E119 Type 2 diabetes mellitus without complications: Secondary | ICD-10-CM | POA: Insufficient documentation

## 2022-11-13 DIAGNOSIS — F1410 Cocaine abuse, uncomplicated: Secondary | ICD-10-CM

## 2022-11-13 DIAGNOSIS — Z7951 Long term (current) use of inhaled steroids: Secondary | ICD-10-CM | POA: Insufficient documentation

## 2022-11-13 DIAGNOSIS — R45851 Suicidal ideations: Secondary | ICD-10-CM

## 2022-11-13 DIAGNOSIS — Z794 Long term (current) use of insulin: Secondary | ICD-10-CM | POA: Insufficient documentation

## 2022-11-13 DIAGNOSIS — I509 Heart failure, unspecified: Secondary | ICD-10-CM | POA: Insufficient documentation

## 2022-11-13 DIAGNOSIS — Z955 Presence of coronary angioplasty implant and graft: Secondary | ICD-10-CM | POA: Insufficient documentation

## 2022-11-13 DIAGNOSIS — Z79899 Other long term (current) drug therapy: Secondary | ICD-10-CM | POA: Insufficient documentation

## 2022-11-13 DIAGNOSIS — R109 Unspecified abdominal pain: Secondary | ICD-10-CM | POA: Insufficient documentation

## 2022-11-13 LAB — RAPID URINE DRUG SCREEN, HOSP PERFORMED
Amphetamines: NOT DETECTED
Barbiturates: NOT DETECTED
Benzodiazepines: NOT DETECTED
Cocaine: POSITIVE — AB
Opiates: NOT DETECTED
Tetrahydrocannabinol: NOT DETECTED

## 2022-11-13 LAB — SALICYLATE LEVEL: Salicylate Lvl: <7 mg/dL — ABNORMAL LOW (ref 7.0–30.0)

## 2022-11-13 LAB — CBC WITH DIFFERENTIAL/PLATELET
Abs Immature Granulocytes: 0.02 10*3/uL (ref 0.00–0.07)
Basophils Absolute: 0 10*3/uL (ref 0.0–0.1)
Basophils Relative: 0 %
Eosinophils Absolute: 0.1 10*3/uL (ref 0.0–0.5)
Eosinophils Relative: 2 %
HCT: 26.3 % — ABNORMAL LOW (ref 39.0–52.0)
Hemoglobin: 8.3 g/dL — ABNORMAL LOW (ref 13.0–17.0)
Immature Granulocytes: 0 %
Lymphocytes Relative: 19 %
Lymphs Abs: 0.9 10*3/uL (ref 0.7–4.0)
MCH: 28.7 pg (ref 26.0–34.0)
MCHC: 31.6 g/dL (ref 30.0–36.0)
MCV: 91 fL (ref 80.0–100.0)
Monocytes Absolute: 0.4 10*3/uL (ref 0.1–1.0)
Monocytes Relative: 8 %
Neutro Abs: 3.4 10*3/uL (ref 1.7–7.7)
Neutrophils Relative %: 71 %
Platelets: 162 10*3/uL (ref 150–400)
RBC: 2.89 MIL/uL — ABNORMAL LOW (ref 4.22–5.81)
RDW: 14.9 % (ref 11.5–15.5)
WBC: 4.8 10*3/uL (ref 4.0–10.5)
nRBC: 0 % (ref 0.0–0.2)

## 2022-11-13 LAB — COMPREHENSIVE METABOLIC PANEL
ALT: 25 U/L (ref 0–44)
AST: 21 U/L (ref 15–41)
Albumin: 3.5 g/dL (ref 3.5–5.0)
Alkaline Phosphatase: 57 U/L (ref 38–126)
Anion gap: 13 (ref 5–15)
BUN: 43 mg/dL — ABNORMAL HIGH (ref 8–23)
CO2: 19 mmol/L — ABNORMAL LOW (ref 22–32)
Calcium: 8.2 mg/dL — ABNORMAL LOW (ref 8.9–10.3)
Chloride: 112 mmol/L — ABNORMAL HIGH (ref 98–111)
Creatinine, Ser: 3.21 mg/dL — ABNORMAL HIGH (ref 0.61–1.24)
GFR, Estimated: 20 mL/min — ABNORMAL LOW (ref >60)
Glucose, Bld: 177 mg/dL — ABNORMAL HIGH (ref 70–99)
Potassium: 4.5 mmol/L (ref 3.5–5.1)
Sodium: 144 mmol/L (ref 135–145)
Total Bilirubin: 0.6 mg/dL (ref 0.3–1.2)
Total Protein: 6.4 g/dL — ABNORMAL LOW (ref 6.5–8.1)

## 2022-11-13 LAB — ACETAMINOPHEN LEVEL: Acetaminophen (Tylenol), Serum: <10 ug/mL — ABNORMAL LOW (ref 10–30)

## 2022-11-13 LAB — ETHANOL: Alcohol, Ethyl (B): <10 mg/dL (ref <10)

## 2022-11-13 NOTE — Progress Notes (Signed)
Notified by RN via secure chat that the pt is back in the ED. Reiterated that TOC is unable to place due to SNF denials because of history of drug or alcohol abuse. EDPs and RN notifid via secure chat. SNF denials are also likely due to pt experiencing homelessness. RN stated the pt received a bus pass earlier today. Informed EDPs and RN that [facility] is open 8pm-8am and [facility] runs 5am-11:30pm.

## 2022-11-13 NOTE — ED Notes (Signed)
Pt's belonging have have been placed behind nurses station (9-25).

## 2022-11-13 NOTE — ED Provider Notes (Signed)
Michael [name]   CSN: [id number] Arrival date & time: 11/13/22  1328     History  Chief Complaint  Patient presents with   Suicidal    Michael [name] is a 72 y.o. male history of cocaine dependency, recent admission for acute renal failure, CHF with pEF, CAD post CABG, CKD stage III, HIV on Elnoria Howard presenting today with SI. Patient states "I am struggling with my health". He complains of back pain, neck pain and abdominal pain. He endorses SI with out any plan. Denies thoughts of hurting other people. He was seen on 11/10/2022 and yesterday with extensive workup and was discharged home.   HPI  Past Medical History:  Diagnosis Date   A-fib ([place])    Anemia    Asthma    No PFTs, history of childhood asthma   CAD (coronary artery disease)    Cellulitis 04/2014   left facial   Cellulitis and abscess of toe of right foot 12/08/2019   Chondromalacia of medial femoral condyle    Left knee MRI 04/28/12: Chondromalacia of the medial femoral condyle with slight peripheral degeneration of the meniscocapsular junction of the medial meniscus; followed by sports medicine   Chronic kidney failure, stage 4 (severe) ([place])    Collagen vascular disease ([place])    Crack cocaine use    for 20+ years, has been enrolled in detox programs in the past   Depression    with history of hospitalization for suicidal ideation   Diabetes mellitus 2002   Diagnosed in 2002, started insulin in 2012   Gout    Headache(784.0)    CT head 08/2011: Periventricular and subcortical white matter hypodensities are most in keeping with chronic microangiopathic change   HIV infection ([place]) 08/2011   Followed by [name]   Hyperlipidemia    Hypertension    Pulmonary embolism [facility])    Past Surgical History:  Procedure Laterality Date   AMPUTATION Right 07/21/2019   Procedure: RIGHT SECOND TOE AMPUTATION;  Surgeon: [name], MD;  Location: [place];   Service: Orthopedics;  Laterality: Right;   BACK SURGERY     1988   BOWEL RESECTION     CARDIAC SURGERY     CERVICAL SPINE SURGERY     " rods in my neck "   CORONARY ARTERY BYPASS GRAFT     CORONARY STENT PLACEMENT     NM MYOCAR PERF WALL MOTION  12/27/2011   normal   SPINE SURGERY       Home Medications Prior to Admission medications   Medication Sig Start Date End Date Taking? Authorizing Provider  Accu-Chek Softclix Lancets lancets Use as directed up to four times daily 09/06/22   Elodia Florence., MD  albuterol (VENTOLIN HFA) 108 (90 Base) MCG/ACT inhaler Inhale 1-2 puffs into the lungs every 6 (six) hours as needed for wheezing or shortness of breath. 10/24/22   [place] Donnamarie Poag, DO  aspirin 81 MG chewable tablet Chew 1 tablet (81 mg total) by mouth daily. 10/24/22 11/23/22  [place] Donnamarie Poag, DO  atorvastatin (LIPITOR) 40 MG tablet Take 1 tablet (40 mg total) by mouth daily. 10/24/22 11/23/22  [place] Donnamarie Poag, DO  Blood Glucose Monitoring Suppl (ACCU-CHEK GUIDE) w/Device KIT Use as directed. 09/06/22   Elodia Florence., MD  budesonide-formoterol [facility]) 160-4.5 MCG/ACT inhaler Inhale 2 puffs into the lungs in the morning and at bedtime. 10/24/22   [place] Eric J, DO  clopidogrel (PLAVIX) 75 MG tablet Take 1 tablet (75 mg total) by mouth daily. 10/24/22 11/23/22  [place] Donnamarie Poag, DO  diltiazem (CARDIZEM CD) 120 MG 24 hr capsule Take 1 capsule (120 mg total) by mouth daily. 10/24/22 11/23/22  [place] Eric J, DO  dolutegravir-rilpivirine (JULUCA) 50-25 MG tablet Take 1 tablet by mouth daily before lunch. 10/24/22 11/23/22  [place] Eric J, DO  ferrous sulfate 325 (65 FE) MG tablet Take 1 tablet (325 mg total) by mouth daily with breakfast. 10/24/22 11/23/22  [place] Donnamarie Poag, DO  FLUoxetine (PROZAC) 20 MG capsule Take 1 capsule (20 mg total) by mouth daily. 10/24/22 11/23/22  [place] Donnamarie Poag, DO  gabapentin (NEURONTIN) 300 MG capsule Take 1 capsule (300 mg total) by mouth at bedtime.  10/24/22 11/23/22  [place] Eric J, DO  glucose blood test strip use as directed up to four times daily 09/06/22   Elodia Florence., MD  hydrALAZINE (APRESOLINE) 50 MG tablet Take 1 tablet (50 mg total) by mouth every 8 (eight) hours. 10/24/22 11/23/22  [place] Eric J, DO  INCRUSE ELLIPTA 62.5 MCG/ACT AEPB Inhale 1 puff into the lungs daily. 10/24/22 11/23/22  [place] Donnamarie Poag, DO  Insulin Pen Needle 31G X 5 MM MISC Use with insulin pens Patient taking differently: 1 each by Other route See admin instructions. Use with insulin pens 10/24/21   [name], MD  Insulin Pen Needle 32G X 4 MM MISC Use as directed up to 4 times daily. 09/06/22   Elodia Florence., MD  isosorbide mononitrate (IMDUR) 30 MG 24 hr tablet Take 0.5 tablets (15 mg total) by mouth daily. 10/24/22 11/23/22  [place] Donnamarie Poag, DO  meclizine (ANTIVERT) 25 MG tablet Take 1 tablet (25 mg total) by mouth 3 (three) times daily as needed for dizziness. Patient not taking: Reported on 10/22/2022 10/18/22   Regalado, Jerald Kief A, MD  multivitamin (RENA-VIT) TABS tablet Take 1 tablet by mouth at bedtime. 10/24/22 11/23/22  [place] Donnamarie Poag, DO  nitroGLYCERIN (NITROSTAT) 0.4 MG SL tablet Place 1 tablet (0.4 mg total) under the tongue every 5 (five) minutes as needed for chest pain. Patient not taking: Reported on 10/22/2022 10/24/21   [name], MD  polyethylene glycol powder [facility]) 17 GM/SCOOP powder Take 17 g by mouth 2 (two) times daily. Patient not taking: Reported on 10/22/2022 10/18/22   Regalado, Jerald Kief A, MD  predniSONE (DELTASONE) 10 MG tablet Take 3 tablets (30 mg total) by mouth daily for 5 days. 11/11/22 11/16/22  Marcello Fennel, PA-C  sodium bicarbonate 650 MG tablet Take 0.5 tablets (325 mg total) by mouth 2 (two) times daily. 10/24/22 11/23/22  [place] Eric J, DO  thiamine (VITAMIN B1) 100 MG tablet Take 1 tablet (100 mg total) by mouth daily. 10/24/22 11/23/22  [place] Eric J, DO      Allergies    Patient has  no known allergies.    Review of Systems   Review of Systems Negative except as per HPI.  Physical Exam Updated Vital Signs BP 130/69 (BP Location: Left Arm)   Pulse 82   Temp 97.9 F (36.6 C) (Oral)   Resp 20   SpO2 100%  Physical Exam Vitals and nursing [name] reviewed.  Constitutional:      Appearance: Normal appearance.  HENT:     Head: Normocephalic and atraumatic.     Mouth/Throat:     Mouth: Mucous membranes are moist.  Eyes:     General: No scleral icterus.  Cardiovascular:     Rate and Rhythm: Normal rate and regular rhythm.     Pulses: Normal pulses.     Heart sounds: Normal heart sounds.  Pulmonary:     Effort: Pulmonary effort is normal.     Breath sounds: Normal breath sounds.  Abdominal:     General: Abdomen is flat.     Palpations: Abdomen is soft.     Tenderness: There is no abdominal tenderness.  Musculoskeletal:        General: No deformity.  Skin:    General: Skin is warm.     Findings: No rash.  Neurological:     General: No focal deficit present.     Mental Status: He is alert.  Psychiatric:     Comments: Patient appears calm and cooperative and answers all questions appropriately.     ED Results / Procedures / Treatments   Labs (all labs ordered are listed, but only abnormal results are displayed) Labs Reviewed  COMPREHENSIVE METABOLIC PANEL - Abnormal; Notable for the following components:      Result Value   Chloride 112 (*)    CO2 19 (*)    Glucose, Bld 177 (*)    BUN 43 (*)    Creatinine, Ser 3.21 (*)    Calcium 8.2 (*)    Total Protein 6.4 (*)    GFR, Estimated 20 (*)    All other components within normal limits  RAPID URINE DRUG SCREEN, HOSP PERFORMED - Abnormal; Notable for the following components:   Cocaine POSITIVE (*)    All other components within normal limits  CBC WITH DIFFERENTIAL/PLATELET - Abnormal; Notable for the following components:   RBC 2.89 (*)    Hemoglobin 8.3 (*)    HCT 26.3 (*)    All other components  within normal limits  SALICYLATE LEVEL - Abnormal; Notable for the following components:   Salicylate Lvl [id number] (*)    All other components within normal limits  ACETAMINOPHEN LEVEL - Abnormal; Notable for the following components:   Acetaminophen (Tylenol), Serum <10 (*)    All other components within normal limits  ETHANOL    EKG None  Radiology DG Pelvis Portable  Result Date: 11/12/2022 CLINICAL DATA:  Status post fall. EXAM: PORTABLE PELVIS 1-2 VIEWS COMPARISON:  June 16, 2022 FINDINGS: There is no evidence of pelvic fracture or diastasis. No pelvic bone lesions are seen. Advanced osteoarthritic changes of the left hip. IMPRESSION: Negative for acute fracture. Electronically Signed   By: Fidela Salisbury M.D.   On: 11/12/2022 16:21   CT Cervical Spine Wo Contrast  Result Date: 11/12/2022 CLINICAL DATA:  Falling with trauma to the head and neck. EXAM: CT CERVICAL SPINE WITHOUT CONTRAST TECHNIQUE: Multidetector CT imaging of the cervical spine was performed without intravenous contrast. Multiplanar CT image reconstructions were also generated. RADIATION DOSE REDUCTION: This exam was performed according to the departmental dose-optimization program which includes automated exposure control, adjustment of the mA and/or kV according to patient size and/or use of iterative reconstruction technique. COMPARISON:  11/10/2022 FINDINGS: Alignment: No traumatic malalignment. Skull base and vertebrae: No regional fracture. Distant ACDF from C3 through C7 with solid union. Soft tissues and spinal canal: No traumatic soft tissue finding. Disc levels: The foramen magnum is widely patent. There is ordinary mild osteoarthritis of the C1-2 articulation but no encroachment upon the neural structures. C2-3: Bulging of the disc. Uncovertebral hypertrophy. Moderate bilateral foraminal stenosis. C3 through C7: Previous ACDF. Solid union with sufficient patency of the  canal and foramina. C7-T1: Facet  arthropathy worse on the right. No canal or foraminal stenosis. Upper chest: Normal Other: None IMPRESSION: 1. No acute or traumatic finding. Distant ACDF from C3 through C7 with solid union and sufficient patency of the canal and foramina. 2. C2-3 spondylosis with disc bulge and uncovertebral hypertrophy. Moderate bilateral foraminal stenosis. 3. C7-T1 facet arthropathy worse on the right. No canal or foraminal stenosis. Electronically Signed   By: Nelson Chimes M.D.   On: 11/12/2022 15:58   CT HEAD WO CONTRAST (5MM)  Result Date: 11/12/2022 CLINICAL DATA:  Head trauma. EXAM: CT HEAD WITHOUT CONTRAST TECHNIQUE: Contiguous axial images were obtained from the base of the skull through the vertex without intravenous contrast. RADIATION DOSE REDUCTION: This exam was performed according to the departmental dose-optimization program which includes automated exposure control, adjustment of the mA and/or kV according to patient size and/or use of iterative reconstruction technique. COMPARISON:  11/10/2022 FINDINGS: Brain: There is periventricular white matter decreased attenuation consistent with small vessel ischemic changes. Ventricles, sulci and cisterns are prominent consistent with age related involutional changes. No acute intracranial hemorrhage, mass effect or shift. No hydrocephalus. Midline 8 mm fat attenuation lesion consistent with a third ventricle lipoma is stable finding. Vascular: No hyperdense vessel or unexpected calcification. Skull: Normal. Negative for fracture or focal lesion. Sinuses/Orbits: No acute finding. IMPRESSION: Atrophy and chronic small vessel ischemic changes. Stable third ventricular lipoma. No acute intracranial process identified. Electronically Signed   By: Sammie Bench M.D.   On: 11/12/2022 15:57   CT Maxillofacial Wo Contrast  Result Date: 11/12/2022 CLINICAL DATA:  Fall with trauma to the face EXAM: CT MAXILLOFACIAL WITHOUT CONTRAST TECHNIQUE: Multidetector CT imaging of  the maxillofacial structures was performed. Multiplanar CT image reconstructions were also generated. RADIATION DOSE REDUCTION: This exam was performed according to the departmental dose-optimization program which includes automated exposure control, adjustment of the mA and/or kV according to patient size and/or use of iterative reconstruction technique. COMPARISON:  None Available. FINDINGS: Osseous: No facial fracture. Orbits: No orbital injury. Sinuses: No fluid in the sinuses.  No inflammatory change. Soft tissues: No significant soft tissue injury identified. Limited intracranial: Negative. Incidental lipoma in the anterior third ventricle region. IMPRESSION: No facial fracture. No orbital injury. Electronically Signed   By: Nelson Chimes M.D.   On: 11/12/2022 15:56   DG Chest 2 View  Result Date: 11/12/2022 CLINICAL DATA:  Weakness and falls. EXAM: CHEST - 2 VIEW COMPARISON:  10/21/2022 FINDINGS: Coronary stents. Left atrial appendage clamp. Prior median sternotomy. Lower cervical plate and screw fixator. The lungs appear clear. No blunting of the costophrenic angles. Atherosclerotic calcification of the aortic arch. Thoracic spondylosis. IMPRESSION: 1. No active cardiopulmonary disease is radiographically apparent. 2. Coronary stents and left atrial appendage clamp noted. 3. Thoracic spondylosis. Electronically Signed   By: Van Clines M.D.   On: 11/12/2022 14:27    Procedures Procedures    Medications Ordered in ED Medications - No data to display  ED Course/ Medical Decision Making/ A&P                             Medical Decision Making Amount and/or Complexity of Data Reviewed Labs: ordered.   This patient presents to the ED for SI, this involves an extensive number of treatment options, and is a complaint that carries with a high risk of complications and morbidity.  The differential diagnosis includes SI/HI, electrolyte abnormalities, psychosis,  depression, panic attack,  anxiety.  This is not an exhaustive list.  Lab tests: I ordered and personally interpreted labs.  The pertinent results include: WBC unremarkable. Hbg unremarkable. Platelets unremarkable. No electrolyte abnormalities noted. BUN 43 baseline, creatinine 3.21 at baseline.  Urine drug screen positive for cocaine.  Problem list/ ED course/ Critical interventions/ Medical management: HPI: See above Vital signs within normal range and stable throughout visit. Laboratory/imaging studies significant for: See above. On physical examination, patient is afebrile and appears in no acute distress. This patient presents with symptoms consistent with HI. Presentation not consistent with acute organic causes to include delirium, dementia or drug induced disorders (acute ingestions or withdrawal). Given the H&P, I suspect this patient is having HI. Psychiatry was consulted and continued patient's hold. Patient was medically cleared and transferred to psychiatric care. I have reviewed the patient home medicines and have made adjustments as needed.  Cardiac monitoring/EKG: The patient was maintained on a cardiac monitor.  I personally reviewed and interpreted the cardiac monitor which showed an underlying rhythm of: sinus rhythm.  Additional history obtained: External records from outside source obtained and reviewed including: Chart review including previous notes, labs, imaging.  Consultations obtained: TTS consulted.  Disposition Patient is placed on psych hold.  This chart was dictated using voice recognition software.  Despite best efforts to proofread,  errors can occur which can change the documentation meaning.          Final Clinical Impression(s) / ED Diagnoses Final diagnoses:  Suicidal ideation    Rx / DC Orders ED Discharge Orders     None         Michael Kras, [place] 11/14/22 1119    Tegeler, [name], MD 11/16/22 [phone]

## 2022-11-13 NOTE — ED Triage Notes (Signed)
Pt c/o SI without plan. Pt denies HI, no AVH. Pt endorses recent cocaine use, homelessness.

## 2022-11-13 NOTE — ED Notes (Signed)
Sleeping no signs of distress respirations appear easy chest rise equal and unlabored.

## 2022-11-13 NOTE — ED Notes (Signed)
Breathing remains unlabor with equal rise and fall of chest no distress noted.

## 2022-11-13 NOTE — ED Provider Notes (Addendum)
Emergency Medicine Observation Re-evaluation Note  Michael [name] is a 72 y.o. male, seen on rounds today.  Pt initially presented to the ED for complaints of Weakness Currently, the patient is awaiting SNF placement.  Physical Exam  BP 138/77 (BP Location: Right Arm)   Pulse 89   Temp 98 F (36.7 C)   Resp 18   Ht '5\' 8"'$  (1.727 m)   Wt 75 kg   SpO2 99%   BMI 25.14 kg/m  Physical Exam General: NAD Musc: Moves all 4 extremities spontaneously Lungs: equal chest rise Psych: resting comfortably  ED Course / MDM  EKG:EKG Interpretation  Date/Time:  Tuesday November 12 2022 13:30:24 EST Ventricular Rate:  78 PR Interval:  149 QRS Duration: 75 QT Interval:  406 QTC Calculation: 463 R Axis:   61 Text Interpretation: Sinus rhythm Probable LVH with secondary repol abnrm No significant change since last tracing Confirmed by Wandra Arthurs [phone]) on 11/12/2022 4:41:06 PM  I have reviewed the labs performed to date as well as medications administered while in observation.  Recent changes in the last 24 hours include None.  Plan  Current plan is for awaiting SNF placement.  I was notified by the social worker that the patient's history of substance abuse unfortunately disqualifies him from all skilled nursing facility placement.  At this point there is nothing else that is able to be offered through that route, will discharge the patient.  PCP follow-up.      Deno Etienne, DO 11/13/22 [place], Andrew, DO 11/13/22 1058

## 2022-11-13 NOTE — Discharge Instructions (Signed)
Follow up with your family doctor in the office.

## 2022-11-13 NOTE — ED Notes (Signed)
Pt is asleep no distress noted chest rise and fall is equal and easy.

## 2022-11-13 NOTE — Consult Note (Signed)
[facility] ED ASSESSMENT   Reason for Consult:  Psych Consult Referring Physician:  Rex Kras, PA-C Patient Identification: Michael [name] MRN:  [id number] ED Chief Complaint: Suicidal ideations  Diagnosis:  Principal Problem:   Suicidal ideations   ED Assessment Time Calculation: Start Time: 7253 Stop Time: 1820 Total Time in Minutes (Assessment Completion): 35   HPI:  Per Triage Note " patient complaining of SI without plan.  Patient denies HI, no AVH.  Patient endorses recent cocaine use, homelessness.   Subjective: Michael [name], 72 y.o., male patient seen face to face by this provider, consulted with Dr. Dwyane Dee; and chart reviewed on 11/13/22.  On evaluation Bhavik Cabiness reports feeling suicidal.  Patient has no plans and no access to guns.  Patient reports that he is sick of life and he feels he has no reason to live.  Patient stated that he is homeless, and did not want to go back on the streets, patient denied using any illicit drugs or alcohol, provider informed him his UDS was positive for cocaine and patient stated "I need help ".  Patient says he is interested in making changes to become a better person, and he wants help. Patient says he is not sure if he has a psychiatric diagnoses says he takes a lot of medications, and he has no support system to help him.  Patient says his appetite and sleep are fair, denies HI/VH, endorses SI and VH says that he sees a big tall man dressed in black, coming to get him to kill him.  Says that he has had 2 strokes and 2 heart attacks, he would not make it being homeless, says that his memory is not clear because of the 2 strokes and his body gets physically weak at times.  During evaluation Michael [name] is snuggled in his blanket on the hospital bed, with blanket pulled up to his chin, in no acute distress.  He is alert, oriented x 4, calm, cooperative and attentive.  His mood is euthymic with congruent affect.  He has normal speech,  low volume, and behavior.  Objectively there is no evidence of psychosis/mania or delusional thinking.  Patient is able to converse coherently, goal directed thoughts, no distractibility, or pre-occupation.  Denies HI/VH, endorses SI and VH says that he sees a big tall man dressed in black, coming to get him to kill him.    Per chart report review, patient was discharged from the [facility] emergency department this morning, and was given bus passes and resources to go to [facility].  Patient was not able to be placed at SNF due to history of drug or alcohol abuse.  Past Psychiatric History:   Risk to Self or Others: Is the patient at risk to self? Patient endorses Has the patient been a risk to self in the past 6 months? No Has the patient been a risk to self within the distant past? No Is the patient a risk to others? No Has the patient been a risk to others in the past 6 months? No Has the patient been a risk to others within the distant past? No  [place] Scale:  [place] ED from 11/13/2022 in [facility] Emergency Department at [facility] ED from 11/12/2022 in [facility] Emergency Department at [facility] ED from 11/10/2022 in [facility] Emergency Department at [place] High Risk Error: Q3, 4, or 5 should not be populated when Q2  is No Moderate Risk       AIMS:  , , ,  ,   ASAM:    Substance Abuse:     Past Medical History:  Past Medical History:  Diagnosis Date   A-fib ([place])    Anemia    Asthma    No PFTs, history of childhood asthma   CAD (coronary artery disease)    Cellulitis 04/2014   left facial   Cellulitis and abscess of toe of right foot 12/08/2019   Chondromalacia of medial femoral condyle    Left knee MRI 04/28/12: Chondromalacia of the medial femoral condyle with slight peripheral degeneration of the meniscocapsular junction of the medial meniscus; followed by sports medicine   Chronic kidney failure, stage 4  (severe) ([place])    Collagen vascular disease ([place])    Crack cocaine use    for 20+ years, has been enrolled in detox programs in the past   Depression    with history of hospitalization for suicidal ideation   Diabetes mellitus 2002   Diagnosed in 2002, started insulin in 2012   Gout    Headache(784.0)    CT head 08/2011: Periventricular and subcortical white matter hypodensities are most in keeping with chronic microangiopathic change   HIV infection ([place]) 08/2011   Followed by [name]   Hyperlipidemia    Hypertension    Pulmonary embolism [facility])     Past Surgical History:  Procedure Laterality Date   AMPUTATION Right 07/21/2019   Procedure: RIGHT SECOND TOE AMPUTATION;  Surgeon: [name], MD;  Location: [place];  Service: Orthopedics;  Laterality: Right;   BACK SURGERY     1988   BOWEL RESECTION     CARDIAC SURGERY     CERVICAL SPINE SURGERY     " rods in my neck "   CORONARY ARTERY BYPASS GRAFT     CORONARY STENT PLACEMENT     NM MYOCAR PERF WALL MOTION  12/27/2011   normal   SPINE SURGERY     Family History:  Family History  Problem Relation Age of Onset   Diabetes Mother    Hypertension Mother    Hyperlipidemia Mother    Diabetes Father    Cancer Father    Hypertension Father    Diabetes Brother    Heart disease Brother    Diabetes Sister    Colon cancer Neg Hx      Social History:  Social History   Substance and Sexual Activity  Alcohol Use No   Alcohol/week: 4.0 standard drinks of alcohol   Types: 2 Cans of beer, 2 Shots of liquor per week     Social History   Substance and Sexual Activity  Drug Use Not Currently   Frequency: 4.0 times per week   Types: "Crack" cocaine, Cocaine   Comment: Recent use of crack    Social History   Socioeconomic History   Marital status: Widowed    Spouse name: Not on file   Number of children: 2   Years of education: 74   Highest education level: 12th grade  Occupational History    Employer:  UNEMPLOYED    Comment: 04/2016  Tobacco Use   Smoking status: Never   Smokeless tobacco: Never  Vaping Use   Vaping Use: Never used  Substance and Sexual Activity   Alcohol use: No    Alcohol/week: 4.0 standard drinks of alcohol    Types: 2 Cans of beer, 2 Shots of liquor per week  Drug use: Not Currently    Frequency: 4.0 times per week    Types: "Crack" cocaine, Cocaine    Comment: Recent use of crack   Sexual activity: Yes    Comment: DECLINED CONDOMS  Other Topics Concern   Not on file  Social History Narrative   Currently staying with a friend in [place].  Was staying @ local motel until a few days ago - left b/c of bed bugs.   Picked up from Extended Stay.  Not followed by a psychiatrist.   Social Determinants of Health   Financial Resource Strain: Medium Risk (09/16/2018)   Overall Financial Resource Strain (CARDIA)    Difficulty of Paying Living Expenses: Somewhat hard  Food Insecurity: No Food Insecurity (10/22/2022)   Hunger Vital Sign    Worried About Running Out of Food in the Last Year: Never true    [place] in the Last Year: Never true  Recent Concern: [place] Present (09/10/2022)   Hunger Vital Sign    Worried About Running Out of Food in the Last Year: Sometimes true    Ran Out of Food in the Last Year: Sometimes true  Transportation Needs: Unmet Transportation Needs (10/22/2022)   PRAPARE - Hydrologist (Medical): Yes    Lack of Transportation (Non-Medical): Yes  Physical Activity: Unknown (04/22/2019)   Exercise Vital Sign    Days of Exercise per Week: Not on file    Minutes of Exercise per Session: 0 min  Stress: Stress Concern Present (09/16/2018)   [place]    Feeling of Stress : Very much  Social Connections: Socially Isolated (09/16/2018)   Social Connection and Isolation Panel [NHANES]    Frequency of Communication with  Friends and Family: Once a week    Frequency of Social Gatherings with Friends and Family: Never    Attends Religious Services: Never    Marine scientist or Organizations: No    Attends Archivist Meetings: Never    Marital Status: Widowed      Allergies:  No Known Allergies  Labs:  Results for orders placed or performed during the hospital encounter of 11/13/22 (from the past 48 hour(s))  Comprehensive metabolic panel     Status: Abnormal   Collection Time: 11/13/22  2:30 PM  Result Value Ref Range   Sodium 144 135 - 145 mmol/L   Potassium 4.5 3.5 - 5.1 mmol/L   Chloride 112 (H) 98 - 111 mmol/L   CO2 19 (L) 22 - 32 mmol/L   Glucose, Bld 177 (H) 70 - 99 mg/dL    Comment: Glucose reference range applies only to samples taken after fasting for at least 8 hours.   BUN 43 (H) 8 - 23 mg/dL   Creatinine, Ser 3.21 (H) 0.61 - 1.24 mg/dL   Calcium 8.2 (L) 8.9 - 10.3 mg/dL   Total Protein 6.4 (L) 6.5 - 8.1 g/dL   Albumin 3.5 3.5 - 5.0 g/dL   AST 21 15 - 41 U/L   ALT 25 0 - 44 U/L   Alkaline Phosphatase 57 38 - 126 U/L   Total Bilirubin 0.6 0.3 - 1.2 mg/dL   GFR, Estimated 20 (L) >60 mL/min    Comment: (NOTE) Calculated using the CKD-EPI Creatinine Equation (2021)    Anion gap 13 5 - 15    Comment: Performed at [facility], [address]  [place], [place] [address]  Ethanol     Status: None   Collection Time: 11/13/22  2:30 PM  Result Value Ref Range   Alcohol, Ethyl (B) <10 <10 mg/dL    Comment: (NOTE) Lowest detectable limit for serum alcohol is 10 mg/dL.  For medical purposes only. Performed at [facility], [place] [address], [place], [place] [address]   CBC with Diff     Status: Abnormal   Collection Time: 11/13/22  2:30 PM  Result Value Ref Range   WBC 4.8 4.0 - 10.5 K/uL   RBC 2.89 (L) 4.22 - 5.81 MIL/uL   Hemoglobin 8.3 (L) 13.0 - 17.0 g/dL   HCT 26.3 (L) 39.0 - 52.0 %   MCV 91.0 80.0 - 100.0 fL   MCH 28.7 26.0  - 34.0 pg   MCHC 31.6 30.0 - 36.0 g/dL   RDW 14.9 11.5 - 15.5 %   Platelets 162 150 - 400 K/uL   nRBC 0.0 0.0 - 0.2 %   Neutrophils Relative % 71 %   Neutro Abs 3.4 1.7 - 7.7 K/uL   Lymphocytes Relative 19 %   Lymphs Abs 0.9 0.7 - 4.0 K/uL   Monocytes Relative 8 %   Monocytes Absolute 0.4 0.1 - 1.0 K/uL   Eosinophils Relative 2 %   Eosinophils Absolute 0.1 0.0 - 0.5 K/uL   Basophils Relative 0 %   Basophils Absolute 0.0 0.0 - 0.1 K/uL   Immature Granulocytes 0 %   Abs Immature Granulocytes 0.02 0.00 - 0.07 K/uL    Comment: Performed at [facility], [place] [address], [place], [place] [address]  Salicylate level     Status: Abnormal   Collection Time: 11/13/22  2:30 PM  Result Value Ref Range   Salicylate Lvl <7.8 (L) 7.0 - 30.0 mg/dL    Comment: Performed at [facility], [place] [address] [place], [place] [address]  Acetaminophen level     Status: Abnormal   Collection Time: 11/13/22  2:30 PM  Result Value Ref Range   Acetaminophen (Tylenol), Serum <10 (L) 10 - 30 ug/mL    Comment: (NOTE) Therapeutic concentrations vary significantly. A range of 10-30 ug/mL  may be an effective concentration for many patients. However, some  are best treated at concentrations outside of this range. Acetaminophen concentrations >150 ug/mL at 4 hours after ingestion  and >50 ug/mL at 12 hours after ingestion are often associated with  toxic reactions.  Performed at [facility], [place] [address] [place], [place] [address]     No current facility-administered medications for this encounter.   Current Outpatient Medications  Medication Sig Dispense Refill   Accu-Chek Softclix Lancets lancets Use as directed up to four times daily 100 each 0   albuterol (VENTOLIN HFA) 108 (90 Base) MCG/ACT inhaler Inhale 1-2 puffs into the lungs every 6 (six) hours as needed for wheezing or shortness of breath. 6.7 g 0   aspirin 81 MG chewable tablet Chew 1  tablet (81 mg total) by mouth daily. 30 tablet 0   atorvastatin (LIPITOR) 40 MG tablet Take 1 tablet (40 mg total) by mouth daily. 30 tablet 0   Blood Glucose Monitoring Suppl (ACCU-CHEK GUIDE) w/Device KIT Use as directed. 1 kit 0   budesonide-formoterol (SYMBICORT) 160-4.5 MCG/ACT inhaler Inhale 2 puffs into the lungs in the morning and at bedtime. 10.2 g 0   clopidogrel (PLAVIX) 75 MG tablet Take 1 tablet (75 mg total) by mouth daily. [place]  tablet 0   diltiazem (CARDIZEM CD) 120 MG 24 hr capsule Take 1 capsule (120 mg total) by mouth daily. 30 capsule 0   dolutegravir-rilpivirine (JULUCA) 50-25 MG tablet Take 1 tablet by mouth daily before lunch. 30 tablet 0   ferrous sulfate 325 (65 FE) MG tablet Take 1 tablet (325 mg total) by mouth daily with breakfast. 30 tablet 0   FLUoxetine (PROZAC) 20 MG capsule Take 1 capsule (20 mg total) by mouth daily. 30 capsule 0   gabapentin (NEURONTIN) 300 MG capsule Take 1 capsule (300 mg total) by mouth at bedtime. 30 capsule 0   glucose blood test strip use as directed up to four times daily 100 each 0   hydrALAZINE (APRESOLINE) 50 MG tablet Take 1 tablet (50 mg total) by mouth every 8 (eight) hours. 90 tablet 0   INCRUSE ELLIPTA 62.5 MCG/ACT AEPB Inhale 1 puff into the lungs daily. 30 each 0   Insulin Pen Needle 31G X 5 MM MISC Use with insulin pens (Patient taking differently: 1 each by Other route See admin instructions. Use with insulin pens) 100 each 1   Insulin Pen Needle 32G X 4 MM MISC Use as directed up to 4 times daily. 100 each 0   isosorbide mononitrate (IMDUR) 30 MG 24 hr tablet Take 0.5 tablets (15 mg total) by mouth daily. 15 tablet 0   meclizine (ANTIVERT) 25 MG tablet Take 1 tablet (25 mg total) by mouth 3 (three) times daily as needed for dizziness. (Patient not taking: Reported on 10/22/2022) 30 tablet 0   multivitamin (RENA-VIT) TABS tablet Take 1 tablet by mouth at bedtime. 30 tablet 0   nitroGLYCERIN (NITROSTAT) 0.4 MG SL tablet Place 1  tablet (0.4 mg total) under the tongue every 5 (five) minutes as needed for chest pain. (Patient not taking: Reported on 10/22/2022) 25 tablet 0   polyethylene glycol powder (GLYCOLAX/MIRALAX) 17 GM/SCOOP powder Take 17 g by mouth 2 (two) times daily. (Patient not taking: Reported on 10/22/2022) 238 g 0   predniSONE (DELTASONE) 10 MG tablet Take 3 tablets (30 mg total) by mouth daily for 5 days. 15 tablet 0   sodium bicarbonate 650 MG tablet Take 0.5 tablets (325 mg total) by mouth 2 (two) times daily. 30 tablet 0   thiamine (VITAMIN B1) 100 MG tablet Take 1 tablet (100 mg total) by mouth daily. 30 tablet 0    Musculoskeletal: Strength & Muscle Tone: within normal limits Gait & Station: normal Patient leans: N/A   Psychiatric Specialty Exam: Presentation  General Appearance:  Appropriate for Environment  Eye Contact: Fair  Speech: Clear and Coherent  Speech Volume: Normal  Handedness: Right   Mood and Affect  Mood: Euthymic  Affect: Appropriate   Thought Process  Thought Processes: Coherent  Descriptions of Associations:Intact  Orientation:Full (Time, Place and Person)  Thought Content:WDL  History of Schizophrenia/Schizoaffective disorder:No  Duration of Psychotic Symptoms:N/A  Hallucinations:Hallucinations: None  Ideas of Reference:None  Suicidal Thoughts:Suicidal Thoughts: Yes, Passive SI Passive Intent and/or Plan: Without Intent; Without Plan; Without Means to Carry Out  Homicidal Thoughts:Homicidal Thoughts: No   Sensorium  Memory: Immediate Fair; Recent Fair  Judgment: Fair  Insight: Fair   Community education officer  Concentration: Fair  Attention Span: Fair  Recall: [place] of Knowledge: Good  Language: Good   Psychomotor Activity  Psychomotor Activity: Psychomotor Activity: Normal   Assets  Assets: Communication Skills    Sleep  Sleep: Sleep: Fair   Physical Exam: Physical Exam Eyes:  Pupils: Pupils  are equal, round, and reactive to light.  Neurological:     Mental Status: He is alert.  Psychiatric:        Attention and Perception: Attention normal.        Mood and Affect: Mood is depressed.        Speech: Speech normal.        Behavior: Behavior is cooperative.        Thought Content: Thought content includes suicidal ideation.        Cognition and Memory: Cognition normal.        Judgment: Judgment is inappropriate.    Review of Systems  HENT: Negative.    Musculoskeletal: Negative.   Skin: Negative.   Psychiatric/Behavioral:  Positive for substance abuse and suicidal ideas.    Blood pressure 130/69, pulse 82, temperature 97.9 F (36.6 C), temperature source Oral, resp. rate 20, SpO2 100 %. There is no height or weight on file to calculate BMI.  Medical Decision Making: Patient is unable to identify any current stressors that are leading to his passive suicidal ideations. Patient is homeless and does endorse not having a support system as one of his stressors, in addition to physical pain. As noted above he is interested in making changes to become a better person, and he wants help. Patient not able to contract for safety with this Probation officer.  Patient denies homicidal ideations, and auditory hallucinations but endorses visual hallucinations and there is no evidence of delusional thought content.  Patient does not appear to be responding to internal stimuli and denies symptoms of paranoia.  Will continuous assess and have FBC review him.     Michaele Offer, PMHNP 11/13/2022 7:13 PM

## 2022-11-13 NOTE — ED Notes (Signed)
Pt care taken, pt is resting in hall.

## 2022-11-13 NOTE — ED Notes (Signed)
Unable to collect labs X1 due to veins.

## 2022-11-13 NOTE — Progress Notes (Addendum)
This Probation officer spoke with the pt to inform that he has received all denials from SNFs due to history of drug or alcohol abuse. Informed the pt that TOC is unable to place him at this time due to no bed offers. Pt verbalized understanding. This Probation officer informed we can provide a couple bus passes for him to get back to the [name] and he stated "I'm not sure if I'm going there." This Probation officer assured he can use the bus passes for where he wants to go - pt verbalized understanding.   This CSW asked the pt if he wants our team to update his son; pt declined and responded "No, I will do it myself." EDP and RN notified via secure chat. No further TOC needs. TOC signing off.

## 2022-11-13 NOTE — Progress Notes (Signed)
No male beds available at [facility].

## 2022-11-14 ENCOUNTER — Encounter (HOSPITAL_COMMUNITY): Payer: Self-pay | Admitting: Emergency Medicine

## 2022-11-14 ENCOUNTER — Other Ambulatory Visit: Payer: Self-pay

## 2022-11-14 DIAGNOSIS — R45851 Suicidal ideations: Secondary | ICD-10-CM

## 2022-11-14 LAB — CBG MONITORING, ED
Glucose-Capillary: 171 mg/dL — ABNORMAL HIGH (ref 70–99)
Glucose-Capillary: 129 mg/dL — ABNORMAL HIGH (ref 70–99)

## 2022-11-14 LAB — RESP PANEL BY RT-PCR (RSV, FLU A&B, COVID)  RVPGX2
Influenza A by PCR: NEGATIVE
Influenza B by PCR: NEGATIVE
Resp Syncytial Virus by PCR: NEGATIVE
SARS Coronavirus 2 by RT PCR: NEGATIVE

## 2022-11-14 MED ORDER — HYDRALAZINE HCL 25 MG PO TABS
50.0000 mg | ORAL_TABLET | Freq: Three times a day (TID) | ORAL | Status: DC
  Administered 2022-11-14 – 2022-11-15 (×4): 50 mg via ORAL
  Filled 2022-11-14 (×4): qty 2

## 2022-11-14 MED ORDER — SODIUM BICARBONATE 650 MG PO TABS
325.0000 mg | ORAL_TABLET | Freq: Two times a day (BID) | ORAL | Status: DC
  Administered 2022-11-14 – 2022-11-15 (×3): 325 mg via ORAL
  Filled 2022-11-14 (×4): qty 0.5

## 2022-11-14 MED ORDER — FLUOXETINE HCL 20 MG PO CAPS
20.0000 mg | ORAL_CAPSULE | Freq: Every day | ORAL | Status: DC
  Administered 2022-11-14 – 2022-11-15 (×2): 20 mg via ORAL
  Filled 2022-11-14 (×2): qty 1

## 2022-11-14 MED ORDER — UMECLIDINIUM BROMIDE 62.5 MCG/ACT IN AEPB
1.0000 | INHALATION_SPRAY | Freq: Every day | RESPIRATORY_TRACT | Status: DC
  Administered 2022-11-15: 1 via RESPIRATORY_TRACT
  Filled 2022-11-14: qty 7

## 2022-11-14 MED ORDER — DILTIAZEM HCL ER COATED BEADS 120 MG PO CP24
120.0000 mg | ORAL_CAPSULE | Freq: Every day | ORAL | Status: DC
  Administered 2022-11-14 – 2022-11-15 (×2): 120 mg via ORAL
  Filled 2022-11-14 (×2): qty 1

## 2022-11-14 MED ORDER — GABAPENTIN 300 MG PO CAPS
300.0000 mg | ORAL_CAPSULE | Freq: Every day | ORAL | Status: DC
  Administered 2022-11-14: 300 mg via ORAL
  Filled 2022-11-14: qty 1

## 2022-11-14 MED ORDER — MOMETASONE FURO-FORMOTEROL FUM 200-5 MCG/ACT IN AERO
2.0000 | INHALATION_SPRAY | Freq: Two times a day (BID) | RESPIRATORY_TRACT | Status: DC
  Administered 2022-11-15: 2 via RESPIRATORY_TRACT
  Filled 2022-11-14: qty 8.8

## 2022-11-14 MED ORDER — CLOPIDOGREL BISULFATE 75 MG PO TABS
75.0000 mg | ORAL_TABLET | Freq: Every day | ORAL | Status: DC
  Administered 2022-11-14 – 2022-11-15 (×2): 75 mg via ORAL
  Filled 2022-11-14 (×2): qty 1

## 2022-11-14 MED ORDER — ISOSORBIDE MONONITRATE ER 30 MG PO TB24
15.0000 mg | ORAL_TABLET | Freq: Every day | ORAL | Status: DC
  Administered 2022-11-14 – 2022-11-15 (×2): 15 mg via ORAL
  Filled 2022-11-14 (×2): qty 1

## 2022-11-14 MED ORDER — DOLUTEGRAVIR-RILPIVIRINE 50-25 MG PO TABS: 1.0000 | ORAL_TABLET | Freq: Every day | ORAL | Status: DC

## 2022-11-14 MED ORDER — INSULIN ASPART 100 UNIT/ML IJ SOLN
0.0000 [IU] | Freq: Three times a day (TID) | INTRAMUSCULAR | Status: DC
  Administered 2022-11-14 – 2022-11-15 (×2): 1 [IU] via SUBCUTANEOUS
  Filled 2022-11-14: qty 0.06

## 2022-11-14 MED ORDER — ASPIRIN 81 MG PO CHEW
81.0000 mg | CHEWABLE_TABLET | Freq: Every day | ORAL | Status: DC
  Administered 2022-11-14 – 2022-11-15 (×2): 81 mg via ORAL
  Filled 2022-11-14 (×2): qty 1

## 2022-11-14 MED ORDER — ATORVASTATIN CALCIUM 40 MG PO TABS
40.0000 mg | ORAL_TABLET | Freq: Every day | ORAL | Status: DC
  Administered 2022-11-14 – 2022-11-15 (×2): 40 mg via ORAL
  Filled 2022-11-14 (×2): qty 1

## 2022-11-14 MED ORDER — ALBUTEROL SULFATE HFA 108 (90 BASE) MCG/ACT IN AERS: 1.0000 | INHALATION_SPRAY | Freq: Four times a day (QID) | RESPIRATORY_TRACT | Status: DC | PRN

## 2022-11-14 NOTE — ED Notes (Signed)
Patient to room 28.  Patient oriented to unit and room.  Patient calm and cooperative.  Patient ambulated to room with stand by assist.

## 2022-11-14 NOTE — Discharge Instructions (Addendum)
Discharge recommendations:  Patient is to take medications as prescribed. Please see information for follow-up appointment with psychiatry and therapy. Please follow up with your primary care provider for all medical related needs.   Therapy: We recommend that patient participate in individual therapy to address mental health concerns.  Medications: The patient or guardian is to contact a medical professional and/or outpatient provider to address any new side effects that develop. The patient or guardian should update outpatient providers of any new medications and/or medication changes.   Atypical antipsychotics: If you are prescribed an atypical antipsychotic, it is recommended that your height, weight, BMI, blood pressure, fasting lipid panel, and fasting blood sugar be monitored by your outpatient providers.  Safety:  The patient should abstain from use of illicit substances/drugs and abuse of any medications. If symptoms worsen or do not continue to improve or if the patient becomes actively suicidal or homicidal then it is recommended that the patient return to the closest hospital emergency department, the [facility], or call 911 for further evaluation and treatment. National Suicide Prevention Lifeline 1-800-SUICIDE or 857-279-2624.  About 988 988 offers 24/7 access to trained crisis counselors who can help people experiencing mental health-related distress. People can call or text 988 or chat 988lifeline.org for themselves or if they are worried about a loved one who may need crisis support.  Crisis Mobile: [facility]:                     5036819739 (for crisis response 24 hours a day) [place]:                                            [phone]

## 2022-11-14 NOTE — Progress Notes (Signed)
Transition of Care [facility]) - Emergency Department Mini Assessment   Patient Details  Name: Michael [name] MRN: [id number] Date of Birth: [birth year]  Transition of Care [facility]) CM/SW Contact:    Roseanne Kaufman, RN Phone Number: 11/14/2022, 4:35 PM   Clinical Narrative: This RNCM received TOC consult for taxi voucher and home health services. Patient should discharge back to [facility] at discharge. Patient does not qualify for home health services.  No current TOC needs at this time.   ED Mini Assessment:    Barriers to Discharge: Continued Medical Work up        Interventions which prevented an admission or readmission: Transportation Screening, Home Health Consult or Services    Patient Contact and Communications        ,              Choice offered to / list presented to : Patient  Admission diagnosis:  SI Patient Active Problem List   Diagnosis Date Noted   AKI (acute kidney injury) ([place]) 10/21/2022   Hyperkalemia 10/05/2022   CKD (chronic kidney disease), stage IV ([place]) 09/23/2022   Polysubstance abuse ([place]) 09/13/2022   ARF (acute renal failure) ([place]) 09/09/2022   (HFpEF) heart failure with preserved ejection fraction ([place]) 09/09/2022   Elevated troponin 09/09/2022   Impaired functional mobility, balance, and endurance 11/29/2021   Transfusion history 11/29/2021   Unstable balance 11/29/2021   Dilated cardiomyopathy ([place]) 11/29/2021   Arthritis 11/29/2021   Diabetic foot infection ([place]) 06/20/2021   Presence of aortocoronary bypass graft 04/16/2021   Iron deficiency anemia 04/04/2021   Atypical chest pain 04/03/2021   Anemia of chronic disease 12/14/2020   Type 2 diabetes mellitus with hyperlipidemia ([place]) 08/14/2020   Idiopathic chronic gout of multiple sites without tophus 02/17/2020   Diabetic ulcer of toe of right foot associated with type 2 diabetes mellitus ([place]) 12/17/2019   Vitamin B12 deficiency 12/17/2019   Homelessness 12/13/2019    Cellulitis 12/07/2019   Osteomyelitis ([place]) 07/17/2019   Abscess or cellulitis of toe, right    Toe pain, right-second    MDD (major depressive disorder), recurrent episode, severe ([place]) 06/15/2019   Respiratory distress 04/12/2019   Pneumonia due to severe acute respiratory syndrome coronavirus 2 (SARS-CoV-2) 04/11/2019   COVID-19 virus infection 03/19/2019   Chest pain 10/03/2018   Malnutrition of moderate degree 09/21/2018   MDD (major depressive disorder), recurrent episode, moderate ([name])    Type II diabetes mellitus with renal manifestations ([place]) 05/04/2018   GERD (gastroesophageal reflux disease) 05/04/2018   S/P CABG (coronary artery bypass graft)    Left testicular pain    Depression 02/20/2018   Epididymo-orchitis, acute 02/20/2018   Essential hypertension 02/20/2018   Precordial chest pain 02/20/2018   Acute kidney injury superimposed on chronic kidney disease ([place]) 02/14/2018   HCAP (healthcare-associated pneumonia) 02/14/2018   History of pulmonary embolism 07/04/2017   Acute hyponatremia 05/11/2017   Hyperglycemia due to type 2 diabetes mellitus ([place]) 05/11/2017   CHF (congestive heart failure) ([place]) 04/01/2017   Hepatitis C 12/30/2016   Substance induced mood disorder ([place]) 11/23/2016   Chronic ischemic heart disease 11/12/2016   Paroxysmal A-fib ([place]) 11/12/2016   Back pain 04/18/2016   S/P carotid endarterectomy 11/15/2015   COPD (chronic obstructive pulmonary disease) ([place]) 10/22/2015   Stenosis of cervical spine with myelopathy ([place]) 10/20/2015   MDD (major depressive disorder), recurrent severe, without psychosis ([place]) 09/09/2015   History of fusion of cervical spine 08/28/2015   Cocaine-induced mood disorder (  [place]) 08/14/2015   Cocaine abuse with cocaine-induced mood disorder ([place]) 08/14/2015   Gout 07/10/2015   Acute renal failure superimposed on stage 3 chronic kidney disease ([place]) 03/06/2015   Anemia 03/06/2015   Chronic kidney disease, stage III  (moderate) ([place]) 03/06/2015   Hypoglycemia    Encounter for general adult medical examination with abnormal findings 02/09/2015   Cocaine use disorder, moderate, dependence ([place]) 12/13/2014   Substance or medication-induced depressive disorder with onset during withdrawal ([place]) 12/13/2014   Severe recurrent major depressive disorder with psychotic features ([place]) 12/12/2014   Major depressive disorder, recurrent, severe without psychotic features ([place])    Suicidal ideations 08/15/2014   Cervicalgia 06/28/2014   Lumbar radiculopathy, chronic 06/28/2014   Asthma, chronic 02/03/2014   S/P percutaneous transluminal coronary angioplasty 10/15/2013   3-vessel CAD 06/24/2013   ED (erectile dysfunction) of organic origin 07/07/2012   Cocaine abuse, continuous ([place]) 05/13/2012   Hypertension goal BP (blood pressure) < 140/80 04/29/2012   Chondromalacia of left knee 03/19/2012   Hyperlipidemia with target LDL less than 100 02/12/2012   Fibromyalgia 02/12/2012   Cocaine use disorder ([place]) 01/10/2012    Class: Acute   Human immunodeficiency virus (HIV) disease ([place]) 12/16/2011   HIV (human immunodeficiency virus infection) ([place]) 08/27/2011   Uncontrolled type 2 diabetes with neuropathy 10/17/2000   PCP:  [name], MD Pharmacy:   [name] [address] [place] [address] Phone: [phone] Fax: [place] [address] [place] [address] Phone: [phone] Fax: [phone]  [place], [place] [address] [place] [address] Phone: [phone] Fax: [place] [address] Phone: [phone] Fax: [phone]

## 2022-11-14 NOTE — Evaluation (Signed)
Physical Therapy Evaluation Patient Details Name: Michael [name] MRN: [id number] DOB: 1951/06/10 Today's Date: 11/14/2022  History of Present Illness  72 y.o. male history of cocaine dependency, recent admission for acute renal failure, CHF with pEF, CAD post CABG, CKD stage III, HIV on [place], neck and back surgery (per pt) presenting to ED 11/13/22 with SI. Patient states "I am struggling with my health". He complains of back pain, neck pain and abdominal pain. He endorses SI with out any plan. Denies thoughts of hurting other people. He was seen on 11/10/2022 and 11/12/22 with extensive workup for falls and was discharged, several other recent admissions for acute renal failure/falls. Pt is homeless. UDS positive for cocaine.  Clinical Impression  Pt admitted with above diagnosis. Pt reports he ambulates with a cane at baseline but that he's misplaced it, and has had multiple recent falls. Pt ambulated 60' with RW without loss of balance, distance limited by reported BLE fatigue. Pt reports dizziness while walking. BP supine 155/79, sit 161/81, stand 149/80, he couldn't tolerate standing for 3 minutes due to BLE fatigue.  Pt currently with functional limitations due to the deficits listed below (see PT Problem List). Pt will benefit from skilled PT to increase their independence and safety with mobility to allow discharge to the venue listed below.          Recommendations for follow up therapy are one component of a multi-disciplinary discharge planning process, led by the attending physician.  Recommendations may be updated based on patient status, additional functional criteria and insurance authorization.  Follow Up Recommendations Skilled nursing-short term rehab (<3 hours/day) (however is difficult to place) Can patient physically be transported by private vehicle: Yes    Assistance Recommended at Discharge Intermittent Supervision/Assistance  Patient can return home with the following  A  little help with walking and/or transfers;A little help with bathing/dressing/bathroom;Assistance with cooking/housework;Help with stairs or ramp for entrance    Equipment Recommendations Rolling walker (2 wheels)  Recommendations for Other Services       Functional Status Assessment Patient has had a recent decline in their functional status and demonstrates the ability to make significant improvements in function in a reasonable and predictable amount of time.     Precautions / Restrictions Precautions Precautions: Fall Precaution Comments: pt reports multiple recent falls but can't recall exactly when they happened, multiple scrapes noted RUE Restrictions Weight Bearing Restrictions: No      Mobility  Bed Mobility Overal bed mobility: Modified Independent Bed Mobility: Supine to Sit, Sit to Supine     Supine to sit: Modified independent (Device/Increase time) Sit to supine: Modified independent (Device/Increase time)   General bed mobility comments: used bedrail    Transfers Overall transfer level: Needs assistance Equipment used: Rolling walker (2 wheels) Transfers: Sit to/from Stand Sit to Stand: From elevated surface, Min guard           General transfer comment: difficulty with powering up from bed in lowered position, able to power up from elevated bed, VCs hand placement    Ambulation/Gait Ambulation/Gait assistance: Min guard Gait Distance (Feet): 44 Feet Assistive device: Rolling walker (2 wheels) Gait Pattern/deviations: Step-through pattern, Trunk flexed, Decreased stride length Gait velocity: decreased     General Gait Details: no loss of balance, distance limited by BLE fatigue, pt reports some dizziness in standing. Assessed orthostatics. (supine 155/79, sit 161/81, stand 149/80) dynamap did not have finger probe for HR  Stairs  Wheelchair Mobility    Modified Rankin (Stroke Patients Only)       Balance Overall balance  assessment: Needs assistance Sitting-balance support: No upper extremity supported, Feet supported Sitting balance-Leahy Scale: Good     Standing balance support: Single extremity supported Standing balance-Leahy Scale: Poor Standing balance comment: Requiring UE support                             Pertinent Vitals/Pain Pain Assessment Pain Assessment: 0-10 Pain Score: 8  Pain Location: Left hip and side; generalized Pain Descriptors / Indicators: Grimacing, Guarding, Sore Pain Intervention(s): Limited activity within patient's tolerance, Monitored during session, Patient requesting pain meds-RN notified    Home Living Family/patient expects to be discharged to:: Unsure     Type of Home: Homeless           Home Equipment: None Additional Comments: Recently was staying with friend but reports that is no longer option, sometimes stays at hotels, recently went to substance abuse rehab in [place] but reports "they didn't keep me long b/c of my health"    Prior Function Prior Level of Function : History of Falls (last six months)             Mobility Comments: Several falls; reports was using cane but not sure where it is located now.  Had eye patch that he was using during [facility] program to help with dizziness but reports vision and dizziness have been better so not using/no longer has eye patch. ADLs Comments: MOD I ADLs, able to do basic household iADLs at DC     Hand Dominance   Dominant Hand: Right    Extremity/Trunk Assessment   Upper Extremity Assessment Upper Extremity Assessment: Overall WFL for tasks assessed    Lower Extremity Assessment Lower Extremity Assessment: Generalized weakness;RLE deficits/detail;LLE deficits/detail RLE Deficits / Details: knee ext +3/5, hip flexion +3/5, ankle DF 4/5 RLE Sensation: decreased light touch LLE Deficits / Details: knee ext 4/5, hip flexion 4/5, ankle DF 4/5 LLE Sensation: decreased light touch     Cervical / Trunk Assessment Cervical / Trunk Assessment: Kyphotic  Communication   Communication: No difficulties  Cognition Arousal/Alertness: Awake/alert Behavior During Therapy: Flat affect Overall Cognitive Status: No family/caregiver present to determine baseline cognitive functioning                                 General Comments: pt not able to recall when his recent falls occurred        General Comments      Exercises     Assessment/Plan    PT Assessment Patient needs continued PT services  PT Problem List Decreased strength;Decreased activity tolerance;Decreased balance;Decreased mobility;Decreased knowledge of use of DME;Impaired sensation;Decreased coordination;Pain;Decreased safety awareness;Decreased knowledge of precautions       PT Treatment Interventions DME instruction;Gait training;Functional mobility training;Therapeutic activities;Therapeutic exercise;Balance training;Neuromuscular re-education;Patient/family education;Stair training    PT Goals (Current goals can be found in the Care Plan section)  Acute Rehab PT Goals Patient Stated Goal: to walk without pain PT Goal Formulation: With patient Time For Goal Achievement: 11/28/22 Potential to Achieve Goals: Fair    Frequency Min 2X/week     Co-evaluation               AM-PAC PT "6 Clicks" Mobility  Outcome Measure Help needed turning from your back to your side while in  a flat bed without using bedrails?: None Help needed moving from lying on your back to sitting on the side of a flat bed without using bedrails?: A Little Help needed moving to and from a bed to a chair (including a wheelchair)?: A Little Help needed standing up from a chair using your arms (e.g., wheelchair or bedside chair)?: A Little Help needed to walk in hospital room?: A Little Help needed climbing 3-5 steps with a railing? : A Lot 6 Click Score: 18    End of Session Equipment Utilized During  Treatment: Gait belt Activity Tolerance: Patient limited by fatigue Patient left: in bed;with call bell/phone within reach;with nursing/sitter in room (ED stretcher) Nurse Communication: Mobility status PT Visit Diagnosis: Other abnormalities of gait and mobility (R26.89);History of falling (Z91.81)    Time: 7253-6644 PT Time Calculation (min) (ACUTE ONLY): 21 min   Charges:   PT Evaluation $PT Eval Moderate Complexity: 1 Mod          Philomena Doheny PT 11/14/2022  Acute Rehabilitation Services  Office [phone]

## 2022-11-14 NOTE — ED Provider Notes (Signed)
Emergency Medicine Observation Re-evaluation Note  Sylvan Sookdeo is a 72 y.o. male, seen on rounds today.  Pt initially presented to the ED for complaints of Suicidal Currently, the patient is sleeping.Marland Kitchen  Physical Exam  BP 134/64 (BP Location: Left Arm)   Pulse 81   Temp 98.5 F (36.9 C) (Oral)   Resp 18   Ht '5\' 8"'$  (1.727 m)   Wt 75 kg   SpO2 100%   BMI 25.14 kg/m  Physical Exam General: No distress Cardiac: Clear lungs Lungs: Well-perfused Psych: Calm  ED Course / MDM  EKG:EKG Interpretation  Date/Time:  Wednesday November 13 2022 14:04:50 EST Ventricular Rate:  81 PR Interval:  148 QRS Duration: 74 QT Interval:  412 QTC Calculation: 478 R Axis:   65 Text Interpretation: Normal sinus rhythm Minimal voltage criteria for LVH, may be normal variant ( Sokolow-Lyon ) ST & Marked T wave abnormality, consider anterolateral ischemia Prolonged QT Abnormal ECG When compared with ECG of 12-Nov-2022 13:30, PREVIOUS ECG IS PRESENT ST abnormality and depression Confirmed by Lavenia Atlas [phone]) on 11/14/2022 11:07:35 AM  I have reviewed the labs performed to date as well as medications administered while in observation.  Recent changes in the last 24 hours include awaiting evaluation by TTS.Marland Kitchen  Plan  Current plan is for psychiatric evaluation.  Patient with a history of cocaine dependency, CHF, CAD, CKD, HIV.  Suicidal without plan.  Multiple recent visits.  Labs appear to be at baseline.    [name], MD 11/14/22 [phone]

## 2022-11-14 NOTE — ED Notes (Signed)
Pt is calm and cooperative, gave pt a sandwich and Milk

## 2022-11-14 NOTE — ED Notes (Signed)
Patient alert. Patient cooperative  Patient did endorse SI thought with no plan. Patient medication compliant.

## 2022-11-14 NOTE — Progress Notes (Signed)
Per provider Michaele Offer, PMHNP pt has been psych cleared. This CSW will now remove pt from the [facility] shift report. TOC will assist with any discharge needs.  Benjaman Kindler, MSW, [facility] 11/14/2022 11:31 PM

## 2022-11-14 NOTE — Discharge Summary (Signed)
[facility] ED Discharge  11/14/2022 2:12 PM Kazen Armijo  MRN:  [id number]  Principal Problem: Suicidal ideations Discharge Diagnoses: Principal Problem:   Suicidal ideations  Clinical Impression:  Final diagnoses:  Suicidal ideation    ED Assessment Time Calculation: Start Time: 1230 Stop Time: 1245 Total Time in Minutes (Assessment Completion): 15   Subjective: [name], 72 y.o., male patient seen face to face by this provider, consulted with Dr. Dwyane Dee; and chart reviewed on 11/14/22.  On evaluation Athanasios Rindlisbacher reports that he is not feeling as suicidal as he was yesterday, he says he is kind of suicidal, saying it is more that he wishes he were dead because of all the medical issues he has.  Patient denied HI/SI/AVH.  Patient said his appetite and sleep are good, said he got a lot of sleep here in the hospital.  Patient told provider that he has no family and he is all alone on the street, homeless.   During evaluation Thiago Cure is laying in bed in no acute distress. He is alert, oriented x 3, calm, cooperative and attentive.  His mood is euthymic/ sad with congruent affect.  He has normal speech, and behavior.  Objectively there is no evidence of psychosis/mania or delusional thinking.  Patient is able to converse coherently, goal directed thoughts, no distractibility, or pre-occupation. He denies suicidal/self-harm/homicidal ideation, psychosis, and paranoia.  Provider and sitter had patient to attempt to walk around his room, patient had a hard time walking due to unsteady gait, says that he had a cane but lost it, says that he walks better with his cane. Provider will give patient resources available in the community, discussed with him interactive resources Center.  Informed patient that he can also go to behavioral health urgent care and obtaining resources.  At this time Tomoya Delira is educated and verbalizes understanding of mental health  resources and other crisis services in the community. He is instructed to call 911 and present to the nearest emergency room should he experience any suicidal/homicidal ideation, auditory/visual/hallucinations, or detrimental worsening of his mental health condition.    Past Psychiatric History: Depression, history of substance abuse  Past Medical History:  Past Medical History:  Diagnosis Date   A-fib ([place])    Anemia    Asthma    No PFTs, history of childhood asthma   CAD (coronary artery disease)    Cellulitis 04/2014   left facial   Cellulitis and abscess of toe of right foot 12/08/2019   Chondromalacia of medial femoral condyle    Left knee MRI 04/28/12: Chondromalacia of the medial femoral condyle with slight peripheral degeneration of the meniscocapsular junction of the medial meniscus; followed by sports medicine   Chronic kidney failure, stage 4 (severe) ([place])    Collagen vascular disease ([place])    Crack cocaine use    for 20+ years, has been enrolled in detox programs in the past   Depression    with history of hospitalization for suicidal ideation   Diabetes mellitus 2002   Diagnosed in 2002, started insulin in 2012   Gout    Headache(784.0)    CT head 08/2011: Periventricular and subcortical white matter hypodensities are most in keeping with chronic microangiopathic change   HIV infection ([place]) 08/2011   Followed by [name]   Hyperlipidemia    Hypertension    Pulmonary embolism [facility])     Past Surgical History:  Procedure Laterality Date   AMPUTATION Right  07/21/2019   Procedure: RIGHT SECOND TOE AMPUTATION;  Surgeon: [name], MD;  Location: [place];  Service: Orthopedics;  Laterality: Right;   BACK SURGERY     1988   BOWEL RESECTION     CARDIAC SURGERY     CERVICAL SPINE SURGERY     " rods in my neck "   CORONARY ARTERY BYPASS GRAFT     CORONARY STENT PLACEMENT     NM MYOCAR PERF WALL MOTION  12/27/2011   normal   SPINE SURGERY     Family History:   Family History  Problem Relation Age of Onset   Diabetes Mother    Hypertension Mother    Hyperlipidemia Mother    Diabetes Father    Cancer Father    Hypertension Father    Diabetes Brother    Heart disease Brother    Diabetes Sister    Colon cancer Neg Hx     Social History:  Social History   Substance and Sexual Activity  Alcohol Use No   Alcohol/week: 4.0 standard drinks of alcohol   Types: 2 Cans of beer, 2 Shots of liquor per week     Social History   Substance and Sexual Activity  Drug Use Not Currently   Frequency: 4.0 times per week   Types: "Crack" cocaine, Cocaine   Comment: Recent use of crack    Social History   Socioeconomic History   Marital status: Widowed    Spouse name: Not on file   Number of children: 2   Years of education: 12   Highest education level: 12th grade  Occupational History    Employer: UNEMPLOYED    Comment: 04/2016  Tobacco Use   Smoking status: Never   Smokeless tobacco: Never  Vaping Use   Vaping Use: Never used  Substance and Sexual Activity   Alcohol use: No    Alcohol/week: 4.0 standard drinks of alcohol    Types: 2 Cans of beer, 2 Shots of liquor per week   Drug use: Not Currently    Frequency: 4.0 times per week    Types: "Crack" cocaine, Cocaine    Comment: Recent use of crack   Sexual activity: Yes    Comment: DECLINED CONDOMS  Other Topics Concern   Not on file  Social History Narrative   Currently staying with a friend in [place].  Was staying @ local motel until a few days ago - left b/c of bed bugs.   Picked up from Extended Stay.  Not followed by a psychiatrist.   Social Determinants of Health   Financial Resource Strain: Medium Risk (09/16/2018)   Overall Financial Resource Strain (CARDIA)    Difficulty of Paying Living Expenses: Somewhat hard  Food Insecurity: No Food Insecurity (10/22/2022)   Hunger Vital Sign    Worried About Running Out of Food in the Last Year: Never true    [place] in the  Last Year: Never true  Recent Concern: Food Insecurity - Food Insecurity Present (09/10/2022)   Hunger Vital Sign    Worried About Running Out of Food in the Last Year: Sometimes true    Ran Out of Food in the Last Year: Sometimes true  Transportation Needs: Unmet Transportation Needs (10/22/2022)   PRAPARE - Hydrologist (Medical): Yes    Lack of Transportation (Non-Medical): Yes  Physical Activity: Unknown (04/22/2019)   Exercise Vital Sign    Days of Exercise per Week: Not on  file    Minutes of Exercise per Session: 0 min  Stress: Stress Concern Present (09/16/2018)   [place]    Feeling of Stress : Very much  Social Connections: Socially Isolated (09/16/2018)   Social Connection and Isolation Panel [NHANES]    Frequency of Communication with Friends and Family: Once a week    Frequency of Social Gatherings with Friends and Family: Never    Attends Religious Services: Never    Marine scientist or Organizations: No    Attends Archivist Meetings: Never    Marital Status: Widowed    Tobacco Cessation:  A prescription for an FDA-approved tobacco cessation medication was offered at discharge and the patient refused  Current Medications: Current Facility-Administered Medications  Medication Dose Route Frequency Provider Last Rate Last Admin   albuterol (VENTOLIN HFA) 108 (90 Base) MCG/ACT inhaler 1-2 puff  1-2 puff Inhalation Q6H PRN Rancour, Stephen, MD       aspirin chewable tablet 81 mg  81 mg Oral Daily Rancour, Stephen, MD   81 mg at 11/14/22 1358   atorvastatin (LIPITOR) tablet 40 mg  40 mg Oral Daily Rancour, Stephen, MD   40 mg at 11/14/22 1358   clopidogrel (PLAVIX) tablet 75 mg  75 mg Oral Daily Rancour, Annie Main, MD   75 mg at 11/14/22 1357   diltiazem (CARDIZEM CD) 24 hr capsule 120 mg  120 mg Oral Daily Rancour, Stephen, MD   120 mg at 11/14/22 1357    dolutegravir-rilpivirine (JULUCA) 50-25 MG per tablet 1 tablet  1 tablet Oral QAC lunch Rancour, Stephen, MD       FLUoxetine (PROZAC) capsule 20 mg  20 mg Oral Daily Rancour, Stephen, MD   20 mg at 11/14/22 1359   gabapentin (NEURONTIN) capsule 300 mg  300 mg Oral QHS Rancour, Stephen, MD       hydrALAZINE (APRESOLINE) tablet 50 mg  50 mg Oral Q8H Rancour, Stephen, MD   50 mg at 11/14/22 1359   insulin aspart (novoLOG) injection 0-6 Units  0-6 Units Subcutaneous TID WC Rancour, Annie Main, MD   1 Units at 11/14/22 1146   isosorbide mononitrate (IMDUR) 24 hr tablet 15 mg  15 mg Oral Daily Rancour, Stephen, MD   15 mg at 11/14/22 1358   mometasone-formoterol (DULERA) 200-5 MCG/ACT inhaler 2 puff  2 puff Inhalation BID Rancour, Annie Main, MD       sodium bicarbonate tablet 325 mg  325 mg Oral BID Rancour, Annie Main, MD   325 mg at 11/14/22 1359   umeclidinium bromide (INCRUSE ELLIPTA) 62.5 MCG/ACT 1 puff  1 puff Inhalation Daily Rancour, Stephen, MD       Current Outpatient Medications  Medication Sig Dispense Refill   Accu-Chek Softclix Lancets lancets Use as directed up to four times daily 100 each 0   albuterol (VENTOLIN HFA) 108 (90 Base) MCG/ACT inhaler Inhale 1-2 puffs into the lungs every 6 (six) hours as needed for wheezing or shortness of breath. 6.7 g 0   aspirin 81 MG chewable tablet Chew 1 tablet (81 mg total) by mouth daily. 30 tablet 0   atorvastatin (LIPITOR) 40 MG tablet Take 1 tablet (40 mg total) by mouth daily. 30 tablet 0   Blood Glucose Monitoring Suppl (ACCU-CHEK GUIDE) w/Device KIT Use as directed. 1 kit 0   budesonide-formoterol (SYMBICORT) 160-4.5 MCG/ACT inhaler Inhale 2 puffs into the lungs in the morning and at bedtime. 10.2 g 0   clopidogrel (  PLAVIX) 75 MG tablet Take 1 tablet (75 mg total) by mouth daily. 30 tablet 0   diltiazem (CARDIZEM CD) 120 MG 24 hr capsule Take 1 capsule (120 mg total) by mouth daily. 30 capsule 0   dolutegravir-rilpivirine (JULUCA) 50-25 MG tablet  Take 1 tablet by mouth daily before lunch. 30 tablet 0   ferrous sulfate 325 (65 FE) MG tablet Take 1 tablet (325 mg total) by mouth daily with breakfast. 30 tablet 0   FLUoxetine (PROZAC) 20 MG capsule Take 1 capsule (20 mg total) by mouth daily. 30 capsule 0   gabapentin (NEURONTIN) 300 MG capsule Take 1 capsule (300 mg total) by mouth at bedtime. 30 capsule 0   glucose blood test strip use as directed up to four times daily 100 each 0   hydrALAZINE (APRESOLINE) 50 MG tablet Take 1 tablet (50 mg total) by mouth every 8 (eight) hours. 90 tablet 0   INCRUSE ELLIPTA 62.5 MCG/ACT AEPB Inhale 1 puff into the lungs daily. 30 each 0   Insulin Pen Needle 31G X 5 MM MISC Use with insulin pens (Patient taking differently: 1 each by Other route See admin instructions. Use with insulin pens) 100 each 1   Insulin Pen Needle 32G X 4 MM MISC Use as directed up to 4 times daily. 100 each 0   isosorbide mononitrate (IMDUR) 30 MG 24 hr tablet Take 0.5 tablets (15 mg total) by mouth daily. 15 tablet 0   meclizine (ANTIVERT) 25 MG tablet Take 1 tablet (25 mg total) by mouth 3 (three) times daily as needed for dizziness. (Patient not taking: Reported on 10/22/2022) 30 tablet 0   multivitamin (RENA-VIT) TABS tablet Take 1 tablet by mouth at bedtime. 30 tablet 0   nitroGLYCERIN (NITROSTAT) 0.4 MG SL tablet Place 1 tablet (0.4 mg total) under the tongue every 5 (five) minutes as needed for chest pain. (Patient not taking: Reported on 10/22/2022) 25 tablet 0   polyethylene glycol powder (GLYCOLAX/MIRALAX) 17 GM/SCOOP powder Take 17 g by mouth 2 (two) times daily. (Patient not taking: Reported on 10/22/2022) 238 g 0   predniSONE (DELTASONE) 10 MG tablet Take 3 tablets (30 mg total) by mouth daily for 5 days. 15 tablet 0   sodium bicarbonate 650 MG tablet Take 0.5 tablets (325 mg total) by mouth 2 (two) times daily. 30 tablet 0   thiamine (VITAMIN B1) 100 MG tablet Take 1 tablet (100 mg total) by mouth daily. 30 tablet 0    PTA Medications: (Not in a hospital admission)   Iatan:  [place] ED from 11/13/2022 in [facility] Emergency Department at [facility] ED from 11/12/2022 in [facility] Emergency Department at [facility] ED from 11/10/2022 in [facility] Emergency Department at [place] Error: Question 6 not populated Error: Q3, 4, or 5 should not be populated when Q2 is No Moderate Risk       Musculoskeletal: Strength & Muscle Tone: decreased Gait & Station: unsteady Patient leans: Backward  Psychiatric Specialty Exam: Presentation  General Appearance:  Appropriate for Environment  Eye Contact: Good  Speech: Clear and Coherent  Speech Volume: Normal  Handedness: Right   Mood and Affect  Mood: Euthymic  Affect: Appropriate   Thought Process  Thought Processes: Coherent  Descriptions of Associations:Intact  Orientation:Full (Time, Place and Person)  Thought Content:WDL  History of Schizophrenia/Schizoaffective disorder:No  Duration of Psychotic Symptoms:N/A  Hallucinations:Hallucinations: None  Ideas of Reference:None  Suicidal Thoughts:Suicidal  Thoughts: No SI Passive Intent and/or Plan: Without Intent; Without Plan; Without Means to Carry Out  Homicidal Thoughts:Homicidal Thoughts: No   Sensorium  Memory: Immediate Fair; Recent Fair  Judgment: Fair  Insight: Fair   Community education officer  Concentration: Fair  Attention Span: Fair  Recall: [employer] of Knowledge: Fair  Language: Good   Psychomotor Activity  Psychomotor Activity: Psychomotor Activity: Normal   Assets  Assets: Communication Skills   Sleep  Sleep: Sleep: Good    Physical Exam: Physical Exam Eyes:     Pupils: Pupils are equal, round, and reactive to light.  Cardiovascular:     Rate and Rhythm: Normal rate.  Neurological:     Mental Status: He is alert.  Psychiatric:        Attention and  Perception: Attention normal.        Mood and Affect: Mood is depressed.        Speech: Speech normal.        Behavior: Behavior is cooperative.        Thought Content: Thought content normal.        Cognition and Memory: Memory normal.        Judgment: Judgment is inappropriate.    Review of Systems  Constitutional: Negative.   HENT: Negative.    Respiratory: Negative.    Psychiatric/Behavioral:  Positive for depression.    Blood pressure 134/64, pulse 81, temperature 98.5 F (36.9 C), temperature source Oral, resp. rate 18, height 5' 8"$  (1.727 m), weight 75 kg, SpO2 100 %. Body mass index is 25.14 kg/m.   Demographic Factors:  Male, Age 89 or older, Divorced or widowed, and Unemployed  Loss Factors: Decline in physical health  Risk Reduction Factors:   Religious beliefs about death  Continued Clinical Symptoms:  Depression:   Anhedonia Hopelessness  Suicide Risk:  Mild:  Suicidal ideation of limited frequency, intensity, duration, and specificity.  There are no identifiable plans, no associated intent, mild dysphoria and related symptoms, good self-control (both objective and subjective assessment), few other risk factors, and identifiable protective factors, including available and accessible social support.   Medical Decision Making: Patient is psychiatrically cleared.  Spoke with ED provider to place a consult for PT to help patient regain his strength in legs.  Place consult for [facility] for cab voucher to go to [facility].   Disposition: Disposition: Patient does not meet criteria for psychiatric inpatient admission. Discussed crisis plan, support from social network, calling 911, coming to the Emergency Department, and calling Suicide Hotline.   Liley Rake MOTLEY-MANGRUM, PMHNP 11/14/2022, 2:12 PM

## 2022-11-15 ENCOUNTER — Emergency Department (HOSPITAL_COMMUNITY)
Admission: EM | Admit: 2022-11-15 | Discharge: 2022-11-16 | Discharge status: Against Medical Advice | Payer: 59 | Attending: Emergency Medicine | Admitting: Emergency Medicine

## 2022-11-15 ENCOUNTER — Encounter (HOSPITAL_COMMUNITY): Payer: Self-pay

## 2022-11-15 DIAGNOSIS — M79606 Pain in leg, unspecified: Secondary | ICD-10-CM | POA: Insufficient documentation

## 2022-11-15 DIAGNOSIS — R0789 Other chest pain: Secondary | ICD-10-CM | POA: Insufficient documentation

## 2022-11-15 DIAGNOSIS — Z7982 Long term (current) use of aspirin: Secondary | ICD-10-CM | POA: Diagnosis not present

## 2022-11-15 DIAGNOSIS — Z794 Long term (current) use of insulin: Secondary | ICD-10-CM | POA: Diagnosis not present

## 2022-11-15 DIAGNOSIS — R45851 Suicidal ideations: Secondary | ICD-10-CM | POA: Insufficient documentation

## 2022-11-15 DIAGNOSIS — R531 Weakness: Secondary | ICD-10-CM | POA: Insufficient documentation

## 2022-11-15 DIAGNOSIS — Z5321 Procedure and treatment not carried out due to patient leaving prior to being seen by health care provider: Secondary | ICD-10-CM | POA: Diagnosis not present

## 2022-11-15 DIAGNOSIS — R109 Unspecified abdominal pain: Secondary | ICD-10-CM | POA: Diagnosis not present

## 2022-11-15 DIAGNOSIS — F1410 Cocaine abuse, uncomplicated: Secondary | ICD-10-CM | POA: Insufficient documentation

## 2022-11-15 LAB — BASIC METABOLIC PANEL
Anion gap: 3 — ABNORMAL LOW (ref 5–15)
BUN: 49 mg/dL — ABNORMAL HIGH (ref 8–23)
CO2: 18 mmol/L — ABNORMAL LOW (ref 22–32)
Calcium: 8 mg/dL — ABNORMAL LOW (ref 8.9–10.3)
Chloride: 117 mmol/L — ABNORMAL HIGH (ref 98–111)
Creatinine, Ser: 2.84 mg/dL — ABNORMAL HIGH (ref 0.61–1.24)
GFR, Estimated: 23 mL/min — ABNORMAL LOW (ref >60)
Glucose, Bld: 129 mg/dL — ABNORMAL HIGH (ref 70–99)
Potassium: 4.7 mmol/L (ref 3.5–5.1)
Sodium: 138 mmol/L (ref 135–145)

## 2022-11-15 LAB — TROPONIN I (HIGH SENSITIVITY)
Troponin I (High Sensitivity): 24 ng/L — ABNORMAL HIGH (ref <18)
Troponin I (High Sensitivity): 22 ng/L — ABNORMAL HIGH (ref <18)

## 2022-11-15 LAB — CBG MONITORING, ED
Glucose-Capillary: 132 mg/dL — ABNORMAL HIGH (ref 70–99)
Glucose-Capillary: 125 mg/dL — ABNORMAL HIGH (ref 70–99)

## 2022-11-15 MED ORDER — DOLUTEGRAVIR SODIUM 50 MG PO TABS
50.0000 mg | ORAL_TABLET | Freq: Every day | ORAL | Status: DC
  Administered 2022-11-15: 50 mg via ORAL
  Filled 2022-11-15: qty 1

## 2022-11-15 MED ORDER — RILPIVIRINE HCL 25 MG PO TABS
25.0000 mg | ORAL_TABLET | Freq: Every day | ORAL | Status: DC
  Administered 2022-11-15: 25 mg via ORAL
  Filled 2022-11-15: qty 1

## 2022-11-15 NOTE — ED Notes (Signed)
Canceled [facility] transport due to NP working on possible rehab placement for patient. Patient also has a friend who states that he can come back to his house for $85/week. Pt informed that we would continue to seek options and would update him once we knew more information.

## 2022-11-15 NOTE — Progress Notes (Addendum)
This RNCM spoke with [facility] who advised the bedside RN called previously to delay transportation. This RNCM advised the patient's walker has been received and patient is ready for transport. This RNCM provided the [place] secretary's phone number for pick up.   No additional TOC needs at this time.   - 5:30p This RNCM received an updated address from [place] for patient to be transported. This RNCM spoke with [facility] to provide new address: [address] [place] [address].     No additional TOC needs, TOC signing off.

## 2022-11-15 NOTE — ED Notes (Signed)
Patient currently in the shower to get cleaned up. Pt informed that he would be going back to the [facility] due to placement issues regarding drug use. Pt verbalized that he would like to go back to a drug rehab facility to get better. Notified NP about patient wanting rehab placement.

## 2022-11-15 NOTE — Progress Notes (Addendum)
Spoke to EDP, Dr. Wyvonnia Dusky, to inform the pt was worked up for SNF on 2/6 and received all denials due to history of drug and alcohol abuse. This CSW spoke with pt on 2/7 and pt did not deny active cocaine use. Informed pt of the reason for denials and pt verbalized understanding. Informed EDP that the [facility] is open 8AM-3PM and overnight 8PM-8AM. Pt will d/c back to the [facility] via cab voucher. Spoke to [place], Therapist, sports.

## 2022-11-15 NOTE — ED Provider Notes (Addendum)
Emergency Medicine Observation Re-evaluation Note  Michael [name] is a 72 y.o. male, seen on rounds today.  Pt initially presented to the ED for complaints of Suicidal Currently, the patient is resting, no distress complaining of leg pain.  Physical Exam  BP (!) 156/81 (BP Location: Right Arm)   Pulse 73   Temp 98.1 F (36.7 C) (Oral)   Resp 18   Ht 5' 8"$  (1.727 m)   Wt 75 kg   SpO2 100%   BMI 25.14 kg/m  Physical Exam General: No distress Cardiac: Well-perfused Lungs: Clear lungs Psych: Calm  ED Course / MDM  EKG:EKG Interpretation  Date/Time:  Wednesday November 13 2022 14:04:50 EST Ventricular Rate:  81 PR Interval:  148 QRS Duration: 74 QT Interval:  412 QTC Calculation: 478 R Axis:   65 Text Interpretation: Normal sinus rhythm Minimal voltage criteria for LVH, may be normal variant ( Sokolow-Lyon ) ST & Marked T wave abnormality, consider anterolateral ischemia Prolonged QT Abnormal ECG When compared with ECG of 12-Nov-2022 13:30, PREVIOUS ECG IS PRESENT ST abnormality and depression Confirmed by Lavenia Atlas [id number]) on 11/14/2022 11:07:35 AM  I have reviewed the labs performed to date as well as medications administered while in observation.  Recent changes in the last 24 hours include cleared by psychiatry and does not need inpatient psychiatric hospitalization.  Plan  Current plan is for social work involvement.  Patient is homeless with no place to go.  Physical therapy recommending SNF.  Apparently difficult to place due to his drug use.  Will reach out to case management again.  Repeat labs.[name], MD 11/15/22 [phone]  Per [place] worker, patient declined by multiple nursing facilities due to his drug use and cannot be placed. patient can be discharged to [facility] today with cab voucher.   [name], MD 11/15/22 0802  Flat is improved from previous.  Creatinine improved from previous.  Patient tolerating p.o. and ambulatory.  Will  discharge to [facility].    [name], MD 11/15/22 1118

## 2022-11-15 NOTE — ED Notes (Signed)
Sleeping with no signs of distress or disturbed sleep respirations appear easy and unlabored

## 2022-11-15 NOTE — ED Triage Notes (Addendum)
Pt bib ems from a friends house; c/o leg, chest, and L flank pain x 1 month; L sided cp, sharp, no radiating, associated sob;  180/102, HR 80, 99% RA; pt currently does not have acces to his medications; pt discharged earlier today

## 2022-11-15 NOTE — Progress Notes (Addendum)
Contacted [employer] to request a Rolling Walker be delivered to bedside. Requested EDP add DME order. RW will be delivered to bedside and pt will d/c to [facility] via safe transport  Addend @ 3:03 PM Rolling [place] Rile has been delivered and safe transport has been called.

## 2022-11-15 NOTE — ED Notes (Signed)
Called twice and no response

## 2022-11-15 NOTE — ED Notes (Signed)
Patient calm and cooperative, laying in bed with no distress noted.

## 2022-11-15 NOTE — ED Notes (Signed)
Patient ambulated in the hallway. Pt being discharged to [facility] with cab voucher.

## 2022-11-15 NOTE — ED Notes (Signed)
Social work called and stated that patient is cleared by them to go to [facility]. States to give patient cab voucher to get to [facility]. MD repeating bloodwork will dispo once we get results.

## 2022-11-15 NOTE — ED Notes (Signed)
Pt has been called multiple times to be roomed and no response

## 2022-11-15 NOTE — Progress Notes (Signed)
Mobility Specialist - Progress Note   11/15/22 1123  Oxygen Therapy  O2 Device Room Air  Mobility  Activity Ambulated with assistance in hallway  Level of Assistance Standby assist, set-up cues, supervision of patient - no hands on  Assistive Device Front wheel walker  Distance Ambulated (ft) 80 ft  Activity Response Tolerated well  Mobility Referral Yes  $Mobility charge 1 Mobility   Pt received in bed and agreeable to mobility. Pt c/o feeling dizzy throughout session. Bp checked & nurse notified. Pt to EOB after session with all needs met.     Post-mobility: 145/75 BP  Set designer

## 2022-11-15 NOTE — ED Notes (Signed)
Per TOC, patient given additional resources to call for substance abuse/rehab help. Patient informed to go home and call these places to see what he needs to do in order to get acceptance and help. Patient verbalized understanding. Sending patient home with [facility] transport to home address.

## 2022-11-15 NOTE — ED Notes (Signed)
Patient being discharged. Will be waiting on walker to be delivered before sending him to [facility].

## 2022-11-16 ENCOUNTER — Emergency Department (HOSPITAL_COMMUNITY): Payer: 59

## 2022-11-16 ENCOUNTER — Other Ambulatory Visit: Payer: Self-pay

## 2022-11-16 ENCOUNTER — Emergency Department (HOSPITAL_COMMUNITY)
Admission: EM | Admit: 2022-11-16 | Discharge: 2022-11-17 | Disposition: A | Discharge status: Home / Self Care | Payer: 59 | Source: Home / Self Care | Attending: Emergency Medicine | Admitting: Emergency Medicine

## 2022-11-16 DIAGNOSIS — R079 Chest pain, unspecified: Secondary | ICD-10-CM | POA: Insufficient documentation

## 2022-11-16 DIAGNOSIS — R45851 Suicidal ideations: Secondary | ICD-10-CM | POA: Insufficient documentation

## 2022-11-16 DIAGNOSIS — R109 Unspecified abdominal pain: Secondary | ICD-10-CM | POA: Insufficient documentation

## 2022-11-16 DIAGNOSIS — R531 Weakness: Secondary | ICD-10-CM | POA: Insufficient documentation

## 2022-11-16 DIAGNOSIS — R0789 Other chest pain: Secondary | ICD-10-CM

## 2022-11-16 DIAGNOSIS — Z794 Long term (current) use of insulin: Secondary | ICD-10-CM | POA: Insufficient documentation

## 2022-11-16 DIAGNOSIS — Z7982 Long term (current) use of aspirin: Secondary | ICD-10-CM | POA: Insufficient documentation

## 2022-11-16 IMAGING — CR DG CHEST 2V
2 series · 2 of 2 positions shown · non-contrast
Comparison: September 09, 2021

CLINICAL DATA: Chest pain.

EXAM:
CHEST - 2 VIEW

[chest pa]
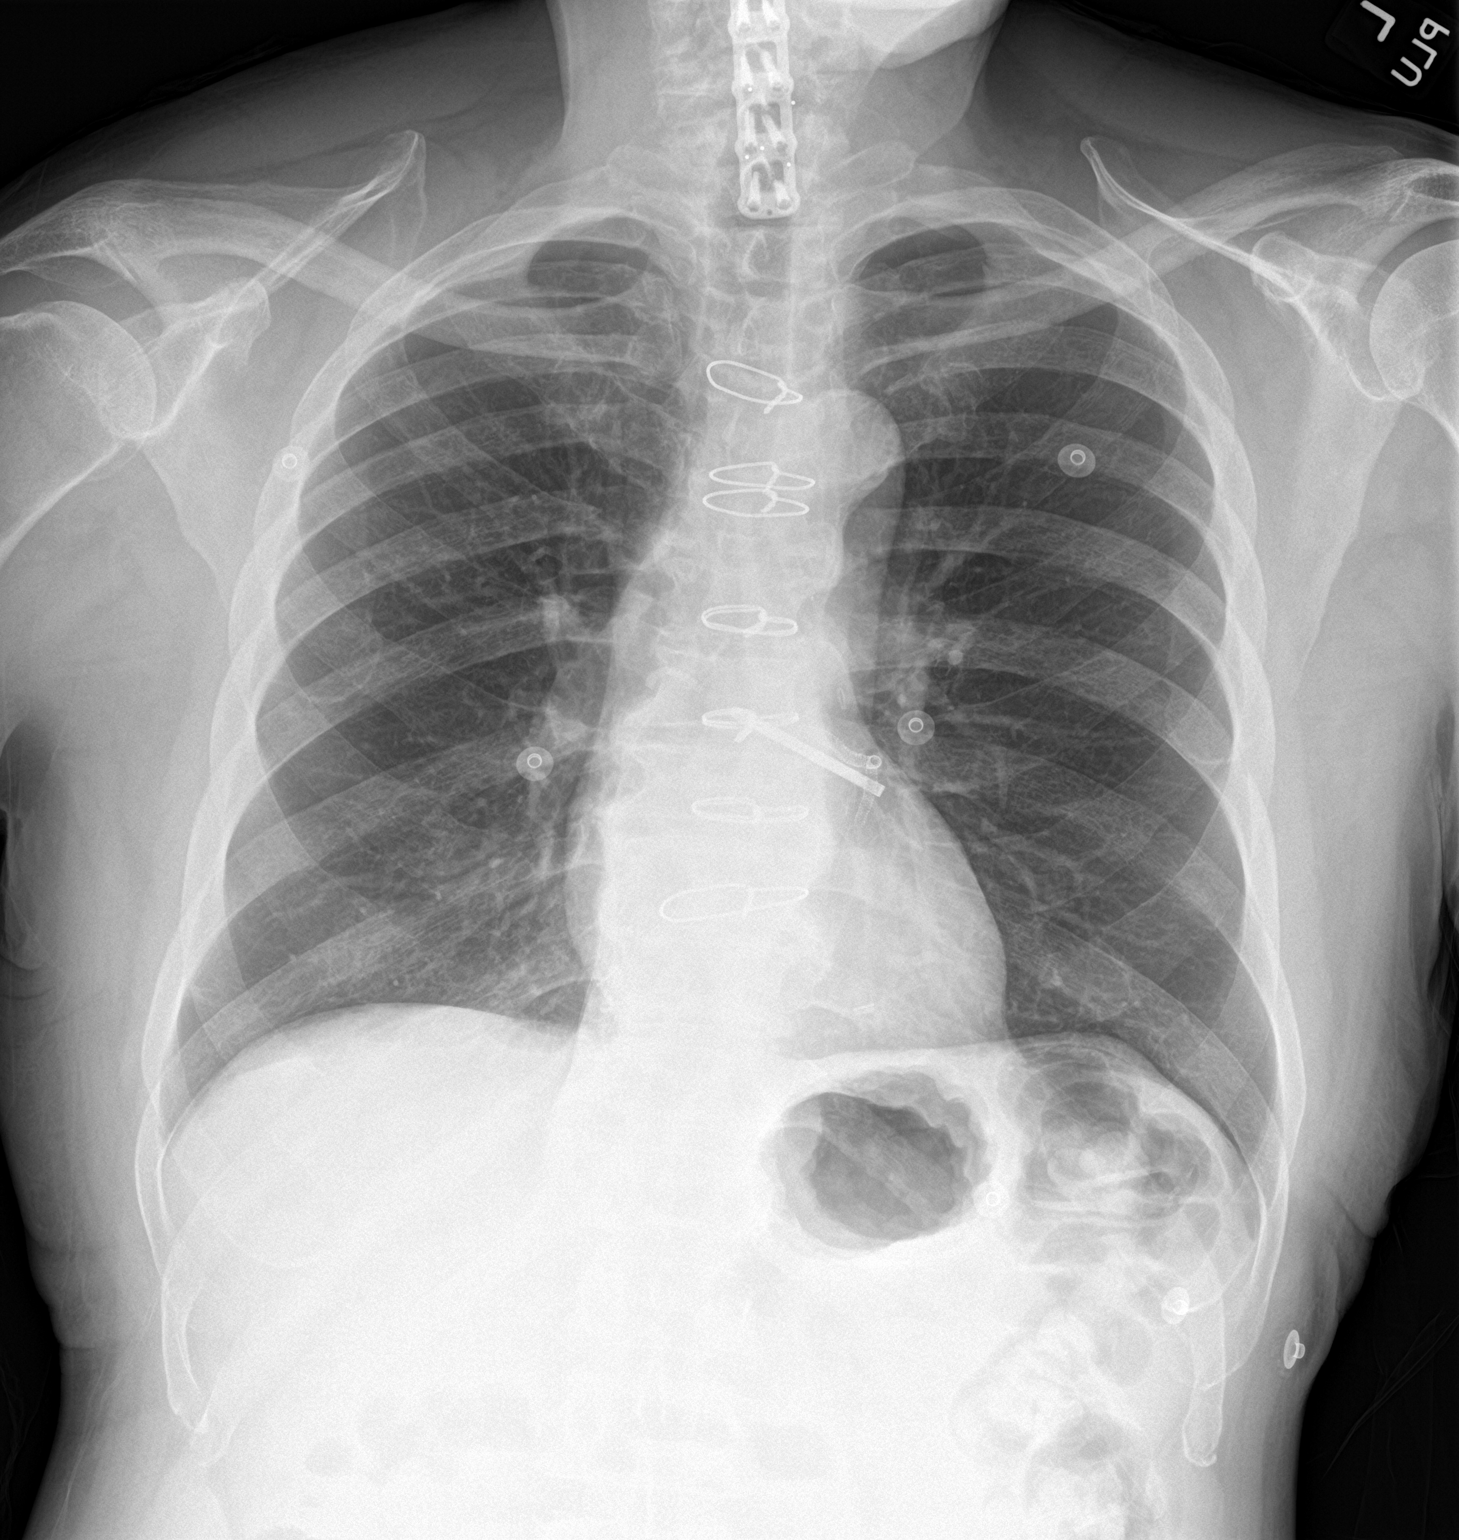

[chest lat]
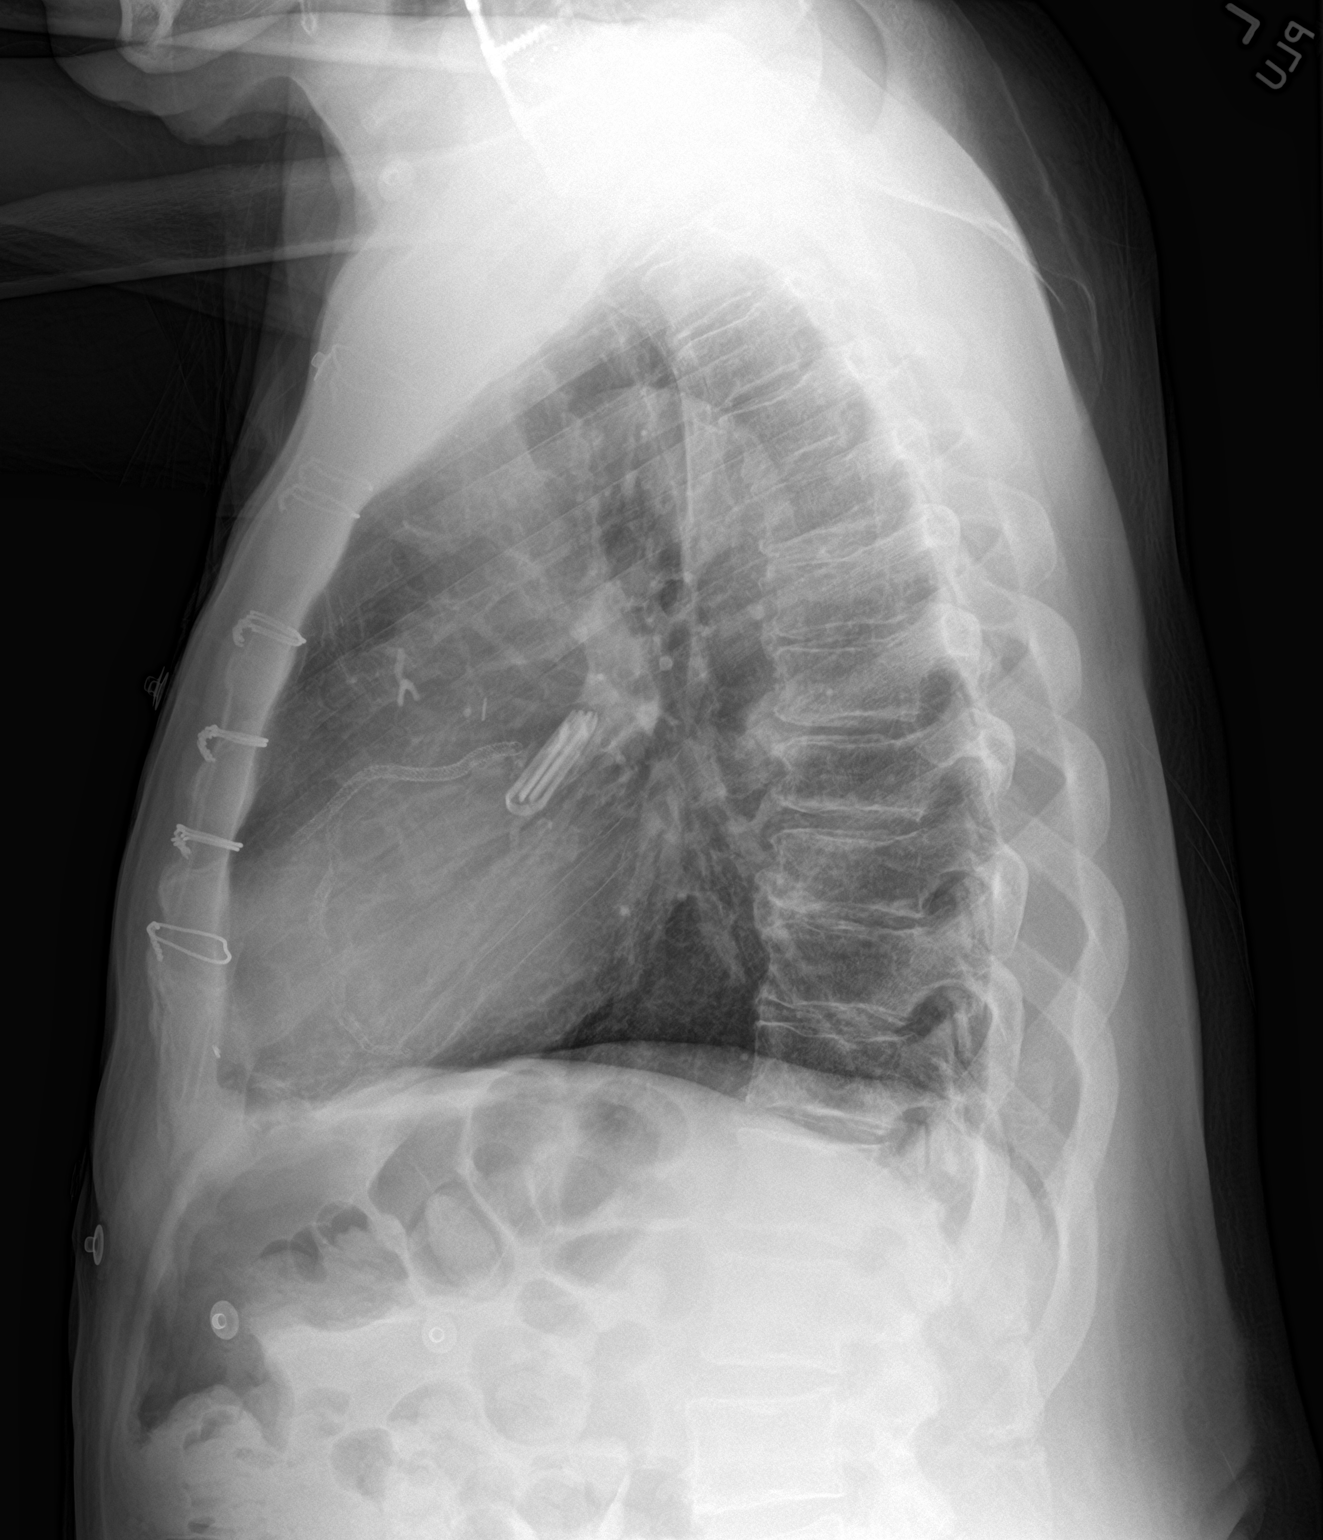

[2 of 2 positions shown; findings below may reference images not displayed]

FINDINGS: Multiple sternal wires are seen. The heart size and mediastinal
contours are within normal limits. A radiopaque left atrial
appendage clip and coronary artery stent are noted. Both lungs are
clear. A radiopaque fixation plate and screws are seen overlying the
cervical spine. Multilevel degenerative changes are seen throughout
the thoracic spine.
IMPRESSION: 1. Evidence of prior median sternotomy/CABG.
2. No acute or active cardiopulmonary disease.

## 2022-11-16 NOTE — ED Triage Notes (Signed)
Pt BIBA to ed for left CP x 1 year that worsened today. Also complaining of abdominal pain.  Endorses SI, without plan. Given nitro and ASA 324 mg PTA. CP has not subsided

## 2022-11-16 NOTE — ED Provider Notes (Signed)
[facility] Provider Note   CSN: [id number] Arrival date & time: 11/16/22  2314     History {Add pertinent medical, surgical, social history, OB history to HPI:1} No chief complaint on file.   Michael [name] is a 72 y.o. male.  Patient has many complaints.  He reports that he is experiencing chest pain.  This has been present since he had bypass surgery 1 year ago.  He reports that he has this pain every day but it somehow worse tonight.  No shortness of breath.  No nausea or diaphoresis.  He does have abdominal pain without vomiting, diarrhea or constipation.  Patient reports that his legs are weak and his knees give out.  He also reports that he is suicidal, this has been ongoing for about a month.  No plan.       Home Medications Prior to Admission medications   Medication Sig Start Date End Date Taking? Authorizing Provider  Accu-Chek Softclix Lancets lancets Use as directed up to four times daily 09/06/22   Elodia Florence., MD  albuterol (VENTOLIN HFA) 108 (90 Base) MCG/ACT inhaler Inhale 1-2 puffs into the lungs every 6 (six) hours as needed for wheezing or shortness of breath. Patient not taking: Reported on 11/14/2022 10/24/22   [place] Eric J, DO  aspirin 81 MG chewable tablet Chew 1 tablet (81 mg total) by mouth daily. 10/24/22 11/23/22  [place] Donnamarie Poag, DO  atorvastatin (LIPITOR) 40 MG tablet Take 1 tablet (40 mg total) by mouth daily. Patient not taking: Reported on 11/14/2022 10/24/22 11/23/22  [place] Eric J, DO  Blood Glucose Monitoring Suppl (ACCU-CHEK GUIDE) w/Device KIT Use as directed. 09/06/22   Elodia Florence., MD  budesonide-formoterol [facility]) 160-4.5 MCG/ACT inhaler Inhale 2 puffs into the lungs in the morning and at bedtime. Patient not taking: Reported on 11/14/2022 10/24/22   [place] Donnamarie Poag, DO  clopidogrel (PLAVIX) 75 MG tablet Take 1 tablet (75 mg total) by mouth daily. Patient not taking: Reported  on 11/14/2022 10/24/22 11/23/22  [place] Eric J, DO  diltiazem (CARDIZEM CD) 120 MG 24 hr capsule Take 1 capsule (120 mg total) by mouth daily. Patient not taking: Reported on 11/14/2022 10/24/22 11/23/22  [place] Donnamarie Poag, DO  dolutegravir-rilpivirine (JULUCA) 50-25 MG tablet Take 1 tablet by mouth daily before lunch. Patient not taking: Reported on 11/14/2022 10/24/22 11/23/22  [place] Eric J, DO  ferrous sulfate 325 (65 FE) MG tablet Take 1 tablet (325 mg total) by mouth daily with breakfast. Patient not taking: Reported on 11/14/2022 10/24/22 11/23/22  [place] Donnamarie Poag, DO  FLUoxetine (PROZAC) 20 MG capsule Take 1 capsule (20 mg total) by mouth daily. Patient not taking: Reported on 11/14/2022 10/24/22 11/23/22  [place] Eric J, DO  gabapentin (NEURONTIN) 300 MG capsule Take 1 capsule (300 mg total) by mouth at bedtime. Patient not taking: Reported on 11/14/2022 10/24/22 11/23/22  [place] Eric J, DO  glucose blood test strip use as directed up to four times daily 09/06/22   Elodia Florence., MD  hydrALAZINE (APRESOLINE) 50 MG tablet Take 1 tablet (50 mg total) by mouth every 8 (eight) hours. Patient not taking: Reported on 11/14/2022 10/24/22 11/23/22  [place] Donnamarie Poag, DO  INCRUSE ELLIPTA 62.5 MCG/ACT AEPB Inhale 1 puff into the lungs daily. Patient not taking: Reported on 11/14/2022 10/24/22 11/23/22  [place] Eric J, DO  Insulin Pen Needle 31G X 5 MM MISC Use with insulin pens Patient taking  differently: 1 each by Other route See admin instructions. Use with insulin pens 10/24/21   [name], MD  Insulin Pen Needle 32G X 4 MM MISC Use as directed up to 4 times daily. 09/06/22   Elodia Florence., MD  isosorbide mononitrate (IMDUR) 30 MG 24 hr tablet Take 0.5 tablets (15 mg total) by mouth daily. Patient not taking: Reported on 11/14/2022 10/24/22 11/23/22  [place] Eric J, DO  meclizine (ANTIVERT) 25 MG tablet Take 1 tablet (25 mg total) by mouth 3 (three) times daily as needed for dizziness. Patient not  taking: Reported on 10/22/2022 10/18/22   Regalado, Jerald Kief A, MD  multivitamin (RENA-VIT) TABS tablet Take 1 tablet by mouth at bedtime. Patient not taking: Reported on 11/14/2022 10/24/22 11/23/22  [place] Donnamarie Poag, DO  nitroGLYCERIN (NITROSTAT) 0.4 MG SL tablet Place 1 tablet (0.4 mg total) under the tongue every 5 (five) minutes as needed for chest pain. Patient not taking: Reported on 10/22/2022 10/24/21   [name], MD  polyethylene glycol powder [facility]) 17 GM/SCOOP powder Take 17 g by mouth 2 (two) times daily. Patient not taking: Reported on 10/22/2022 10/18/22   Regalado, Jerald Kief A, MD  predniSONE (DELTASONE) 10 MG tablet Take 3 tablets (30 mg total) by mouth daily for 5 days. Patient not taking: Reported on 11/14/2022 11/11/22 11/16/22  Marcello Fennel, PA-C  sodium bicarbonate 650 MG tablet Take 0.5 tablets (325 mg total) by mouth 2 (two) times daily. Patient not taking: Reported on 11/14/2022 10/24/22 11/23/22  [place] Eric J, DO  thiamine (VITAMIN B1) 100 MG tablet Take 1 tablet (100 mg total) by mouth daily. Patient not taking: Reported on 11/14/2022 10/24/22 11/23/22  [place] Eric J, DO      Allergies    Patient has no known allergies.    Review of Systems   Review of Systems  Physical Exam Updated Vital Signs Ht 5' 8"$  (1.727 m)   Wt 73.9 kg   SpO2 100%   BMI 24.78 kg/m  Physical Exam Vitals and nursing note reviewed.  Constitutional:      General: He is not in acute distress.    Appearance: He is well-developed.  HENT:     Head: Normocephalic and atraumatic.     Mouth/Throat:     Mouth: Mucous membranes are moist.  Eyes:     General: Vision grossly intact. Gaze aligned appropriately.     Extraocular Movements: Extraocular movements intact.     Conjunctiva/sclera: Conjunctivae normal.  Cardiovascular:     Rate and Rhythm: Normal rate and regular rhythm.     Pulses: Normal pulses.     Heart sounds: Normal heart sounds, S1 normal and S2 normal. No murmur  heard.    No friction rub. No gallop.  Pulmonary:     Effort: Pulmonary effort is normal. No respiratory distress.     Breath sounds: Normal breath sounds.  Abdominal:     Palpations: Abdomen is soft.     Tenderness: There is no abdominal tenderness. There is no guarding or rebound.     Hernia: No hernia is present.  Musculoskeletal:        General: No swelling.     Cervical back: Full passive range of motion without pain, normal range of motion and neck supple. No pain with movement, spinous process tenderness or muscular tenderness. Normal range of motion.     Right lower leg: No edema.     Left lower leg: No edema.  Skin:  General: Skin is warm and dry.     Capillary Refill: Capillary refill takes less than 2 seconds.     Findings: No ecchymosis, erythema, lesion or wound.  Neurological:     Mental Status: He is alert and oriented to person, place, and time.     GCS: GCS eye subscore is 4. GCS verbal subscore is 5. GCS motor subscore is 6.     Cranial Nerves: Cranial nerves 2-12 are intact.     Sensory: Sensation is intact.     Motor: Motor function is intact. No weakness or abnormal muscle tone.     Coordination: Coordination is intact.  Psychiatric:        Mood and Affect: Mood normal.        Speech: Speech normal.        Behavior: Behavior normal.     ED Results / Procedures / Treatments   Labs (all labs ordered are listed, but only abnormal results are displayed) Labs Reviewed  CBC WITH DIFFERENTIAL/PLATELET  COMPREHENSIVE METABOLIC PANEL  LIPASE, BLOOD  URINALYSIS, ROUTINE W REFLEX MICROSCOPIC  RAPID URINE DRUG SCREEN, HOSP PERFORMED  TROPONIN I (HIGH SENSITIVITY)    EKG None  Radiology No results found.  Procedures Procedures  {Document cardiac monitor, telemetry assessment procedure when appropriate:1}  Medications Ordered in ED Medications - No data to display  ED Course/ Medical Decision Making/ A&P   {   Click here for ABCD2, HEART and other  calculatorsREFRESH Note before signing :1}                          Medical Decision Making Amount and/or Complexity of Data Reviewed Labs: ordered. Radiology: ordered.   ***  {Document critical care time when appropriate:1} {Document review of labs and clinical decision tools ie heart score, Chads2Vasc2 etc:1}  {Document your independent review of radiology images, and any outside records:1} {Document your discussion with family members, caretakers, and with consultants:1} {Document social determinants of health affecting pt's care:1} {Document your decision making why or why not admission, treatments were needed:1} Final Clinical Impression(s) / ED Diagnoses Final diagnoses:  None    Rx / DC Orders ED Discharge Orders     None

## 2022-11-17 LAB — CBC WITH DIFFERENTIAL/PLATELET
Abs Immature Granulocytes: 0.02 10*3/uL (ref 0.00–0.07)
Basophils Absolute: 0 10*3/uL (ref 0.0–0.1)
Basophils Relative: 1 %
Eosinophils Absolute: 0.1 10*3/uL (ref 0.0–0.5)
Eosinophils Relative: 3 %
HCT: 24.1 % — ABNORMAL LOW (ref 39.0–52.0)
Hemoglobin: 7.4 g/dL — ABNORMAL LOW (ref 13.0–17.0)
Immature Granulocytes: 1 %
Lymphocytes Relative: 24 %
Lymphs Abs: 1 10*3/uL (ref 0.7–4.0)
MCH: 28.6 pg (ref 26.0–34.0)
MCHC: 30.7 g/dL (ref 30.0–36.0)
MCV: 93.1 fL (ref 80.0–100.0)
Monocytes Absolute: 0.5 10*3/uL (ref 0.1–1.0)
Monocytes Relative: 11 %
Neutro Abs: 2.5 10*3/uL (ref 1.7–7.7)
Neutrophils Relative %: 60 %
Platelets: 155 10*3/uL (ref 150–400)
RBC: 2.59 MIL/uL — ABNORMAL LOW (ref 4.22–5.81)
RDW: 14.7 % (ref 11.5–15.5)
WBC: 4.1 10*3/uL (ref 4.0–10.5)
nRBC: 0 % (ref 0.0–0.2)

## 2022-11-17 LAB — COMPREHENSIVE METABOLIC PANEL
ALT: 25 U/L (ref 0–44)
AST: 18 U/L (ref 15–41)
Albumin: 3.4 g/dL — ABNORMAL LOW (ref 3.5–5.0)
Alkaline Phosphatase: 58 U/L (ref 38–126)
Anion gap: 5 (ref 5–15)
BUN: 48 mg/dL — ABNORMAL HIGH (ref 8–23)
CO2: 21 mmol/L — ABNORMAL LOW (ref 22–32)
Calcium: 8.5 mg/dL — ABNORMAL LOW (ref 8.9–10.3)
Chloride: 112 mmol/L — ABNORMAL HIGH (ref 98–111)
Creatinine, Ser: 3.39 mg/dL — ABNORMAL HIGH (ref 0.61–1.24)
GFR, Estimated: 18 mL/min — ABNORMAL LOW (ref >60)
Glucose, Bld: 197 mg/dL — ABNORMAL HIGH (ref 70–99)
Potassium: 4.2 mmol/L (ref 3.5–5.1)
Sodium: 138 mmol/L (ref 135–145)
Total Bilirubin: 0.5 mg/dL (ref 0.3–1.2)
Total Protein: 6.1 g/dL — ABNORMAL LOW (ref 6.5–8.1)

## 2022-11-17 LAB — RAPID URINE DRUG SCREEN, HOSP PERFORMED
Amphetamines: NOT DETECTED
Barbiturates: NOT DETECTED
Benzodiazepines: NOT DETECTED
Cocaine: POSITIVE — AB
Opiates: NOT DETECTED
Tetrahydrocannabinol: NOT DETECTED

## 2022-11-17 LAB — URINALYSIS, ROUTINE W REFLEX MICROSCOPIC
Bacteria, UA: NONE SEEN
Bilirubin Urine: NEGATIVE
Glucose, UA: 50 mg/dL — AB
Ketones, ur: NEGATIVE mg/dL
Leukocytes,Ua: NEGATIVE
Nitrite: NEGATIVE
Protein, ur: 100 mg/dL — AB
Specific Gravity, Urine: 1.013 (ref 1.005–1.030)
pH: 5 (ref 5.0–8.0)

## 2022-11-17 LAB — TROPONIN I (HIGH SENSITIVITY)
Troponin I (High Sensitivity): 27 ng/L — ABNORMAL HIGH (ref <18)
Troponin I (High Sensitivity): 28 ng/L — ABNORMAL HIGH (ref <18)

## 2022-11-17 LAB — LIPASE, BLOOD: Lipase: 68 U/L — ABNORMAL HIGH (ref 11–51)

## 2022-11-17 IMAGING — CT CT ABD-PELV W/O CM
2 of 4 series · 16 of 46 positions shown, 18 images · non-contrast
Comparison: September 02, 2018

CLINICAL DATA: Abdominal pain.

EXAM:
CT ABDOMEN AND PELVIS WITHOUT CONTRAST
TECHNIQUE: Multidetector CT imaging of the abdomen and pelvis was performed
following the standard protocol without IV contrast.

[Series 2: axial st · axial · 0.75mm/px · z∈[-451,-76]mm · 13 of 85 slices shown, 15 images]
[im 5/85  soft-tissue]
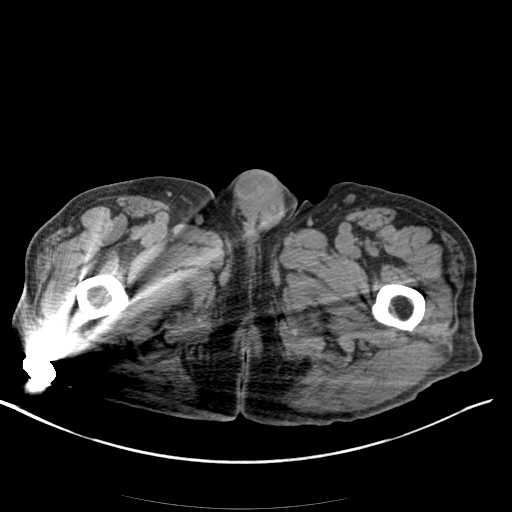
[im 5/85  bone]
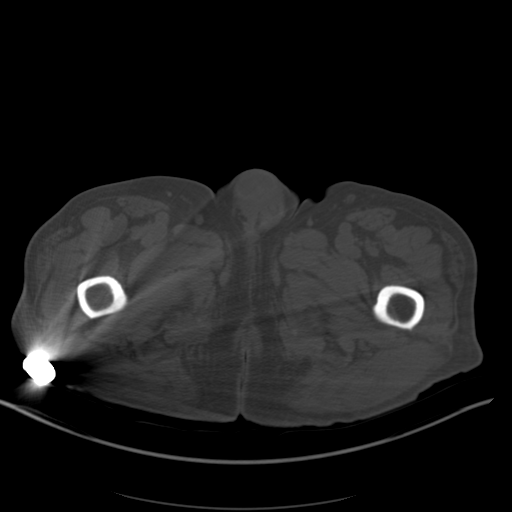
[im 13/85  soft-tissue]
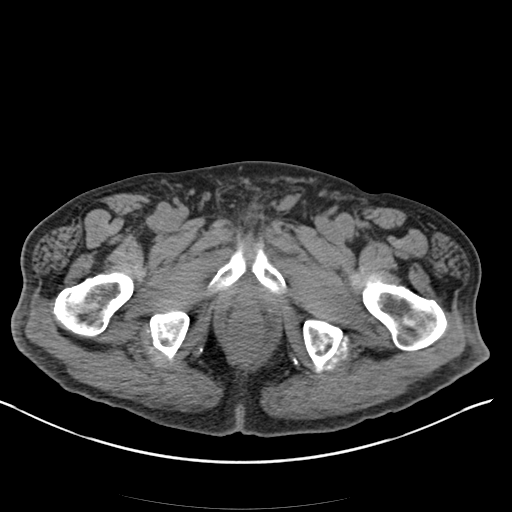
[im 17/85  soft-tissue]
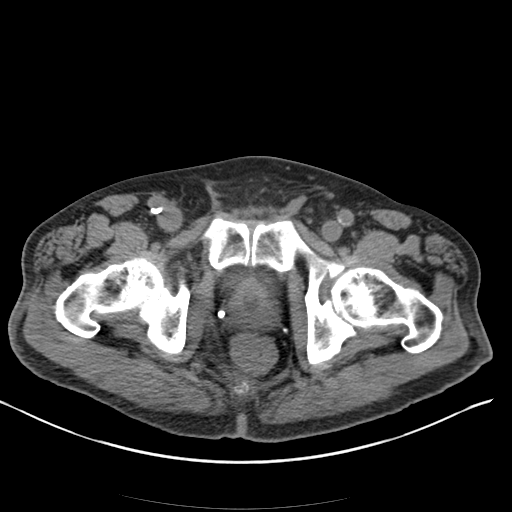
[im 26/85  soft-tissue]
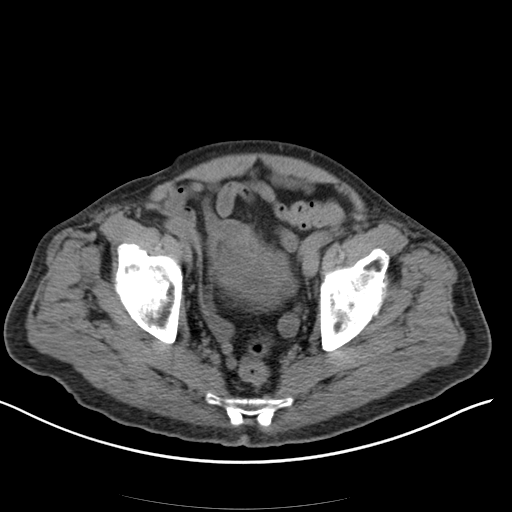
[im 30/85  soft-tissue]
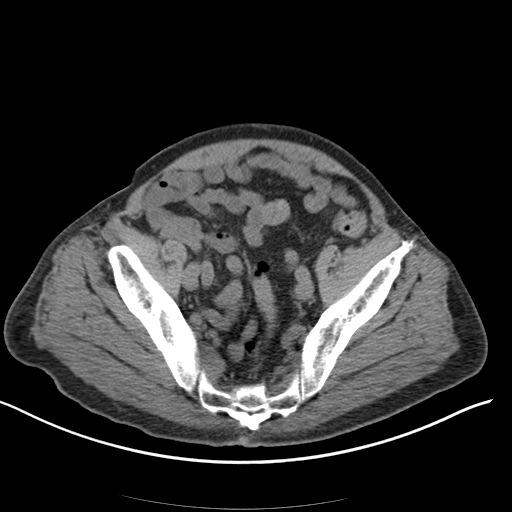
[im 38/85  soft-tissue]
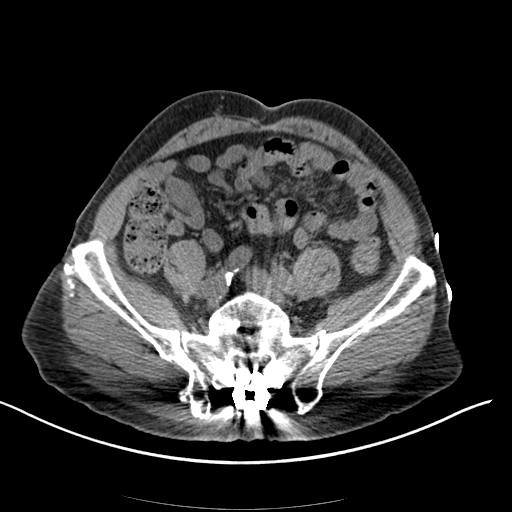
[im 43/85  soft-tissue]
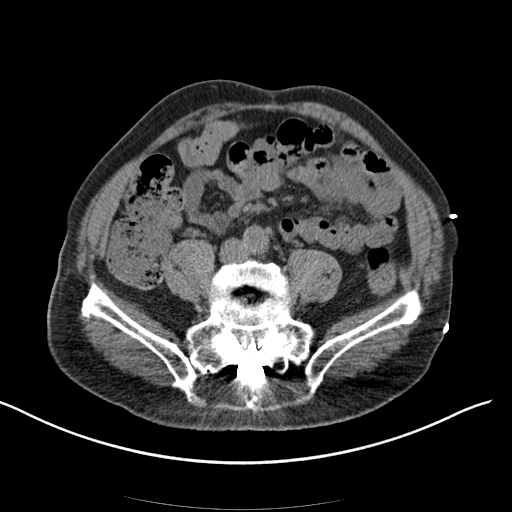
[im 47/85  soft-tissue]
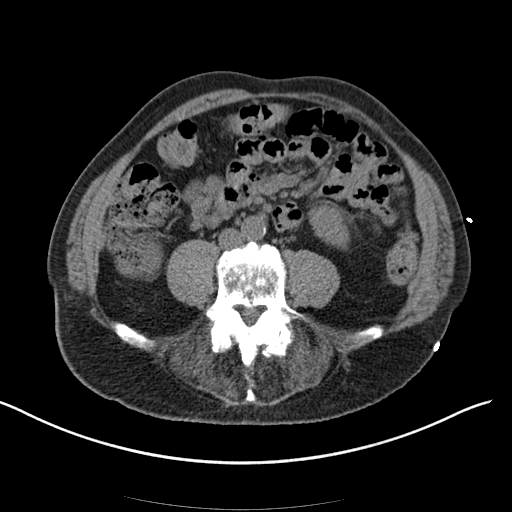
[im 55/85  soft-tissue]
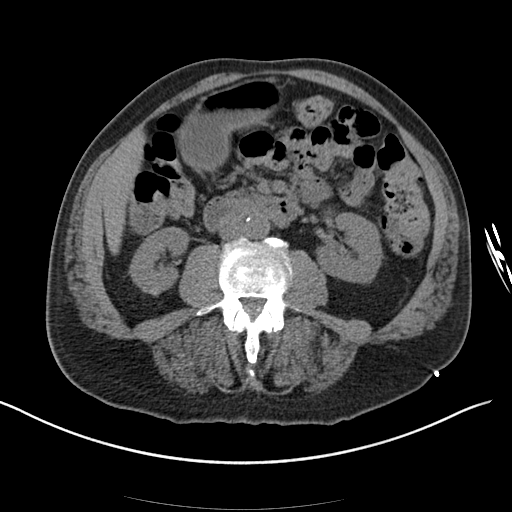
[im 55/85  bone]
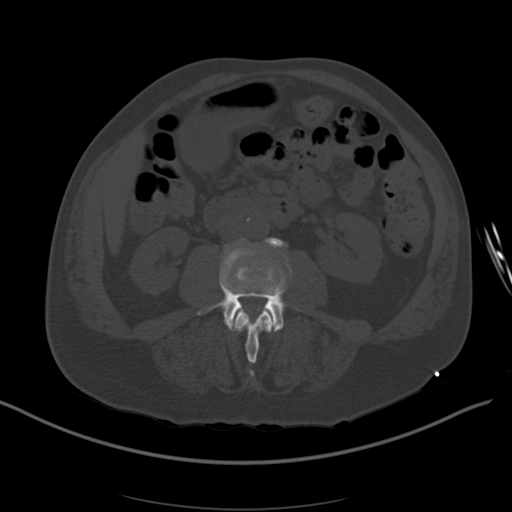
[im 59/85  soft-tissue]
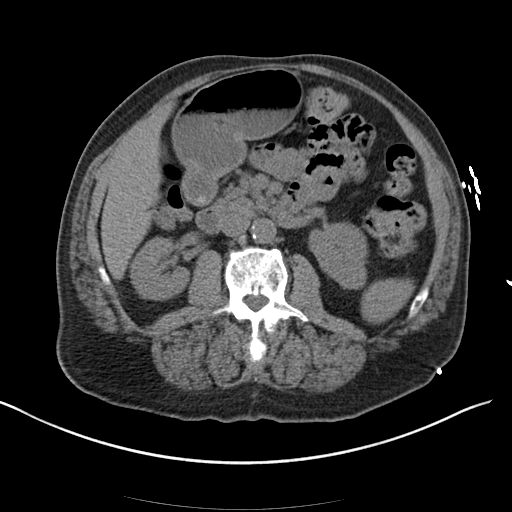
[im 68/85  soft-tissue]
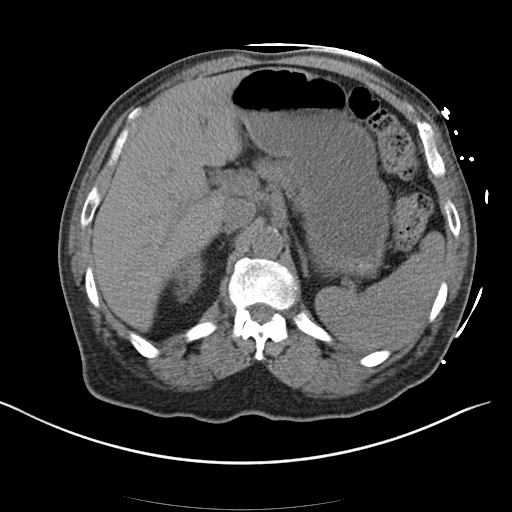
[im 72/85  soft-tissue]
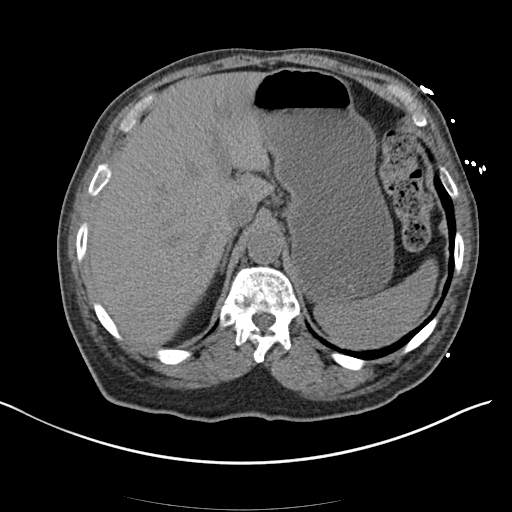
[im 80/85  soft-tissue]
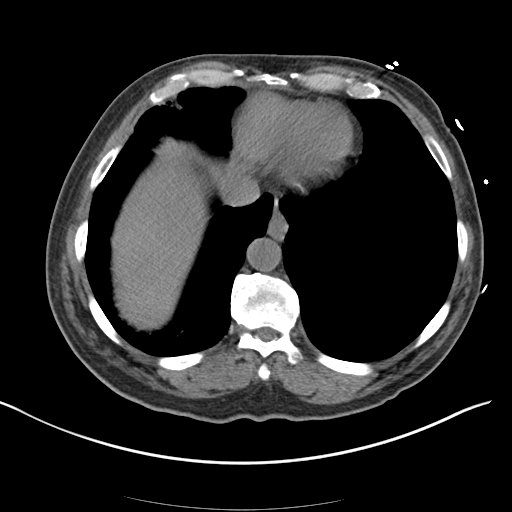

[Series 4: coronal st · coronal · 0.71mm/px · 3 of 151 slices shown]
[im 51/151  soft-tissue]
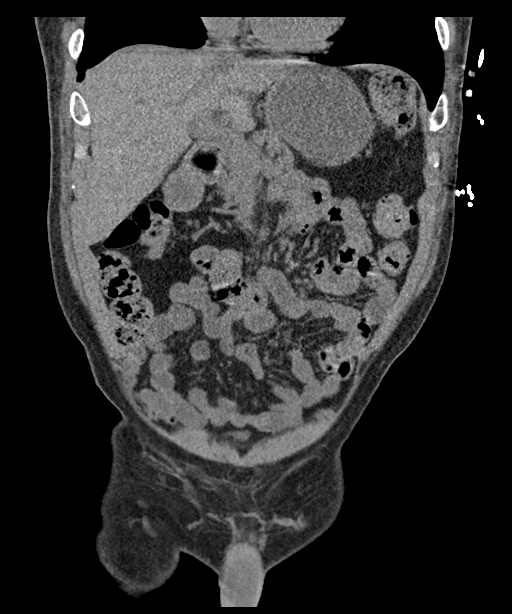
[im 67/151  soft-tissue]
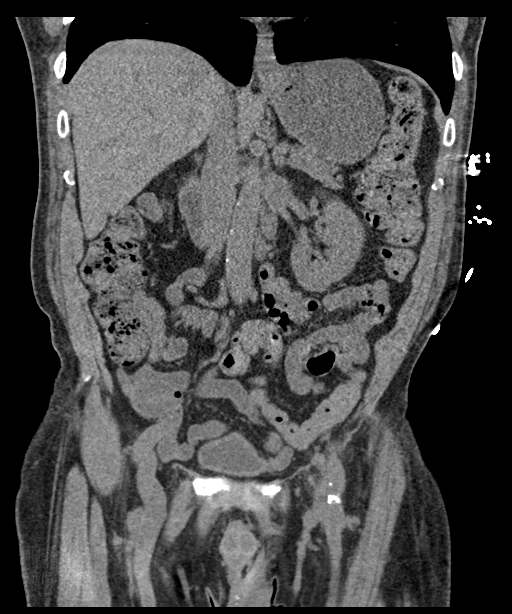
[im 84/151  soft-tissue]
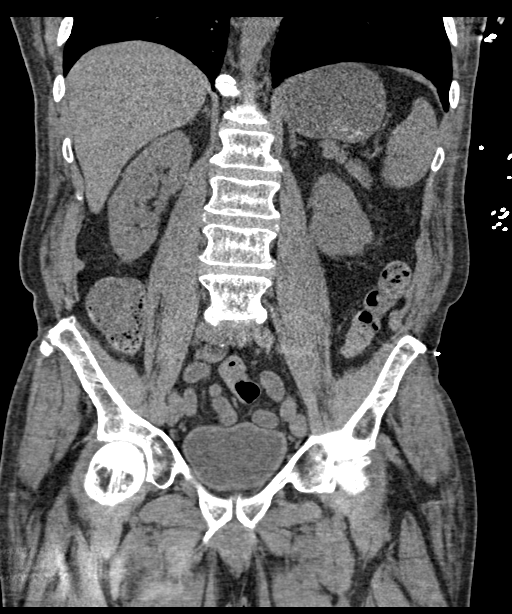

[16 of 46 positions shown; findings below may reference images not displayed]

FINDINGS: Lower chest: Mild to moderate severity right middle lobe atelectasis
and/or infiltrate.

Mild posterolateral right lower lobe focal atelectasis with mild
posteromedial right lower lobe scarring.

Hepatobiliary: No focal liver abnormality is seen. No gallstones,
gallbladder wall thickening, or biliary dilatation.

Pancreas: Unremarkable. No pancreatic ductal dilatation or
surrounding inflammatory changes.

Spleen: Normal in size without focal abnormality.

Adrenals/Urinary Tract: Adrenal glands are unremarkable. Kidneys are
normal, without renal calculi, focal lesion, or hydronephrosis. The
urinary bladder is moderately distended.

Stomach/Bowel: Stomach is within normal limits. The appendix is
poorly visualized. Stool is seen throughout the large bowel. No
evidence of bowel wall thickening, distention, or inflammatory
changes.

Vascular/Lymphatic: Aortic atherosclerosis. No enlarged abdominal or
pelvic lymph nodes.

Reproductive: Prostate is unremarkable.

Other: No abdominal wall hernia or abnormality. No abdominopelvic
ascites.

Musculoskeletal: Approximately 4 mm anterolisthesis of the L4
vertebral body is noted on L5.

Postoperative changes with metallic density hardware is seen along
the posterior elements of the L5-S1 level.

Multilevel degenerative changes are noted throughout the lumbar
spine.
IMPRESSION: 1. Mild to moderate severity right middle lobe atelectasis and/or
infiltrate.
2. No evidence of an acute or active process within the abdomen or
pelvis.
3. Postoperative and degenerative changes within the lumbar spine,
as described above.
4. Aortic atherosclerosis.

Aortic Atherosclerosis ([id number]-[id number].R).

## 2022-11-18 IMAGING — DX DG CHEST 1V PORT
1 series · 1 of 1 positions shown · non-contrast
Comparison: 10/07/2021.

CLINICAL DATA: Chest pain.

EXAM:
PORTABLE CHEST 1 VIEW

[chest ap]
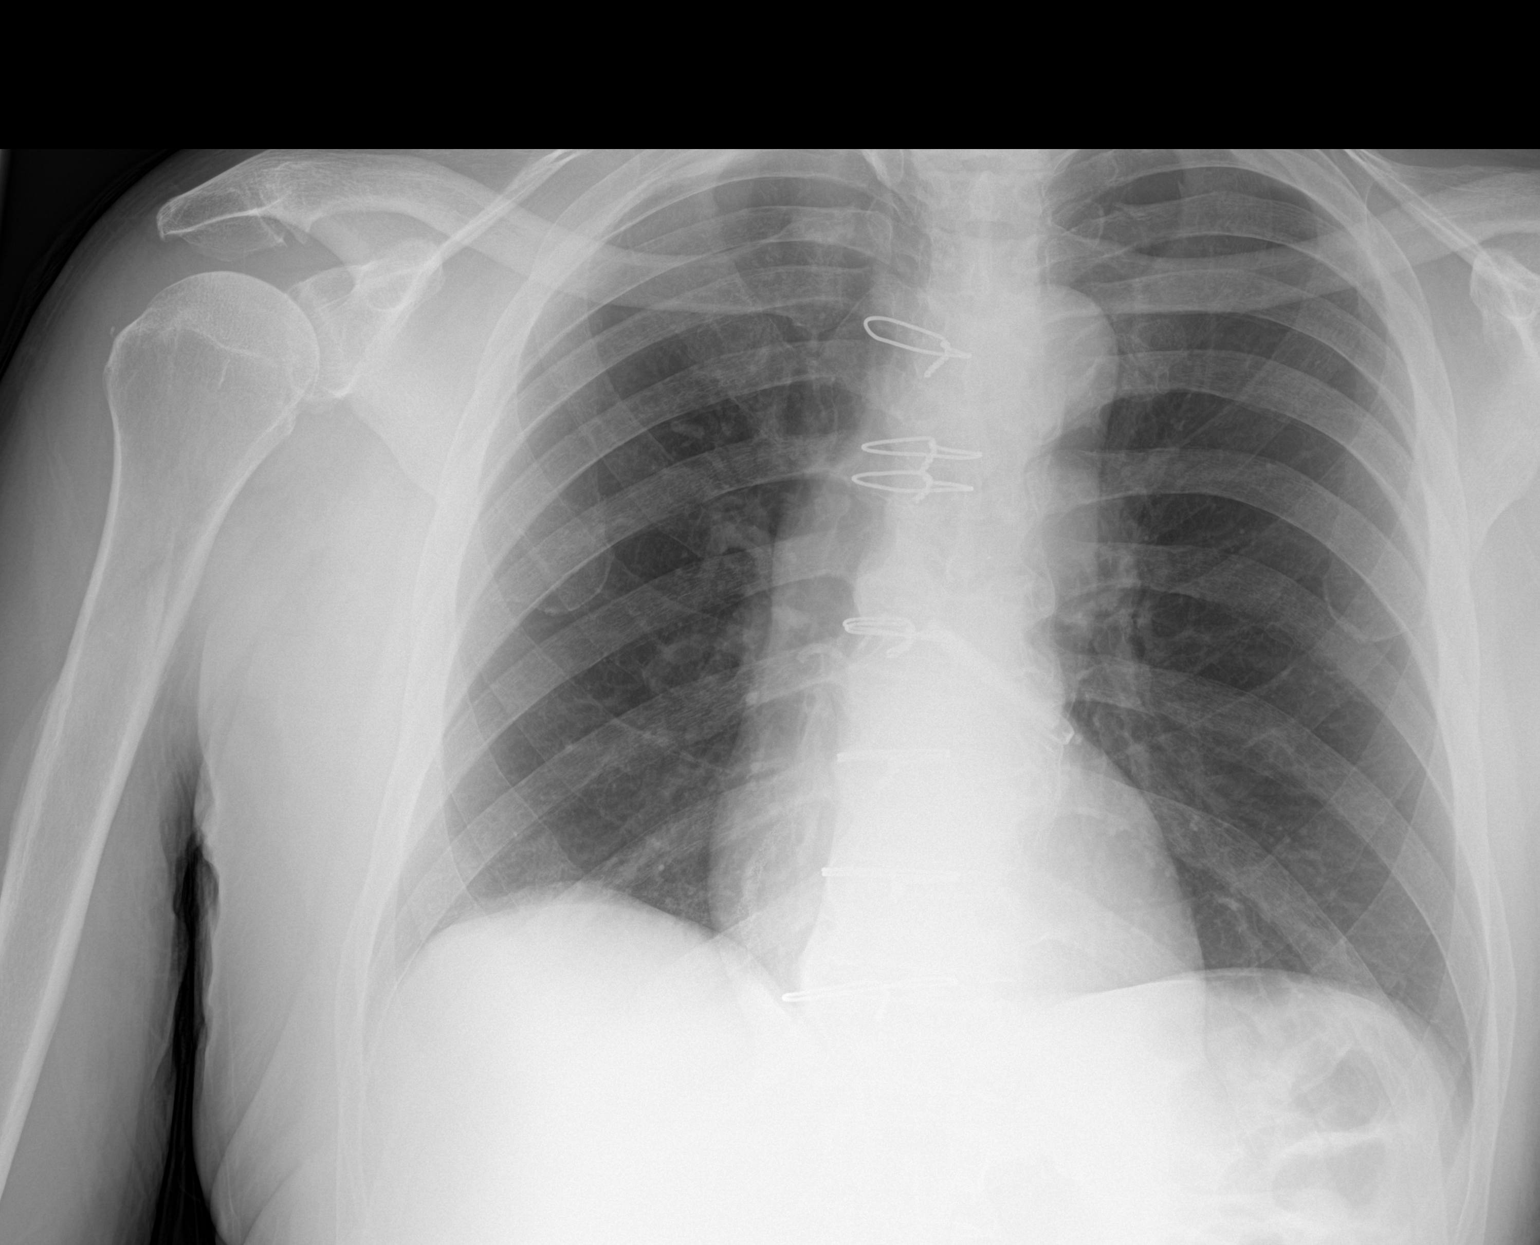

[1 of 1 positions shown; findings below may reference images not displayed]

FINDINGS: The heart size and mediastinal contours are within normal limits.
There is evidence of prior cardiothoracic surgery. A left atrial
appendage clip and coronary artery stent are noted. No
consolidation, effusion, or pneumothorax. No acute osseous
abnormality. Cervical spinal fusion hardware is noted.
IMPRESSION: No acute cardiopulmonary process

## 2022-11-23 IMAGING — CR DG CHEST 2V
2 series · 2 of 2 positions shown · non-contrast
Comparison: 10/09/2021, 10/07/2021

CLINICAL DATA: Chest pain history of heart surgery

EXAM:
CHEST - 2 VIEW

[chest pa]
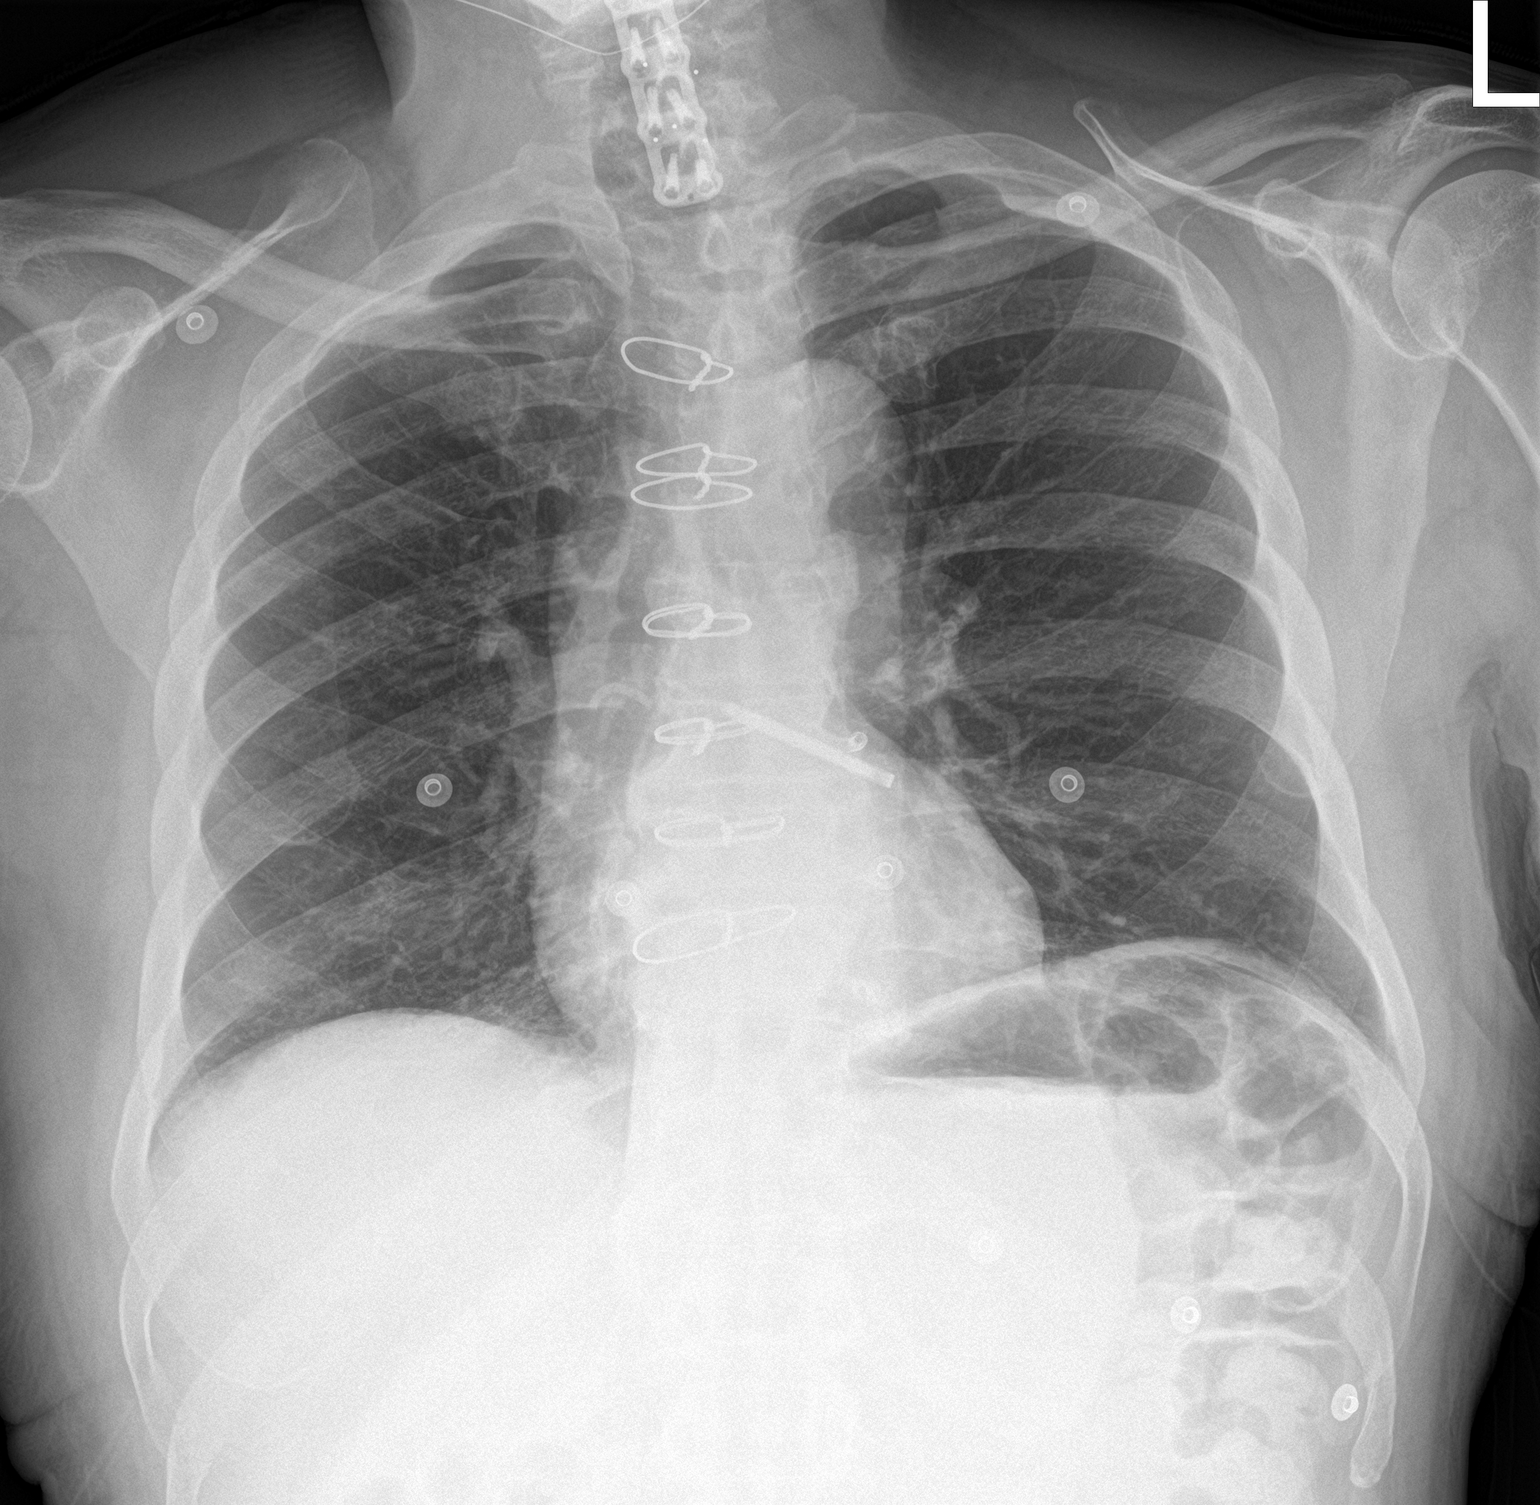

[chest lat]
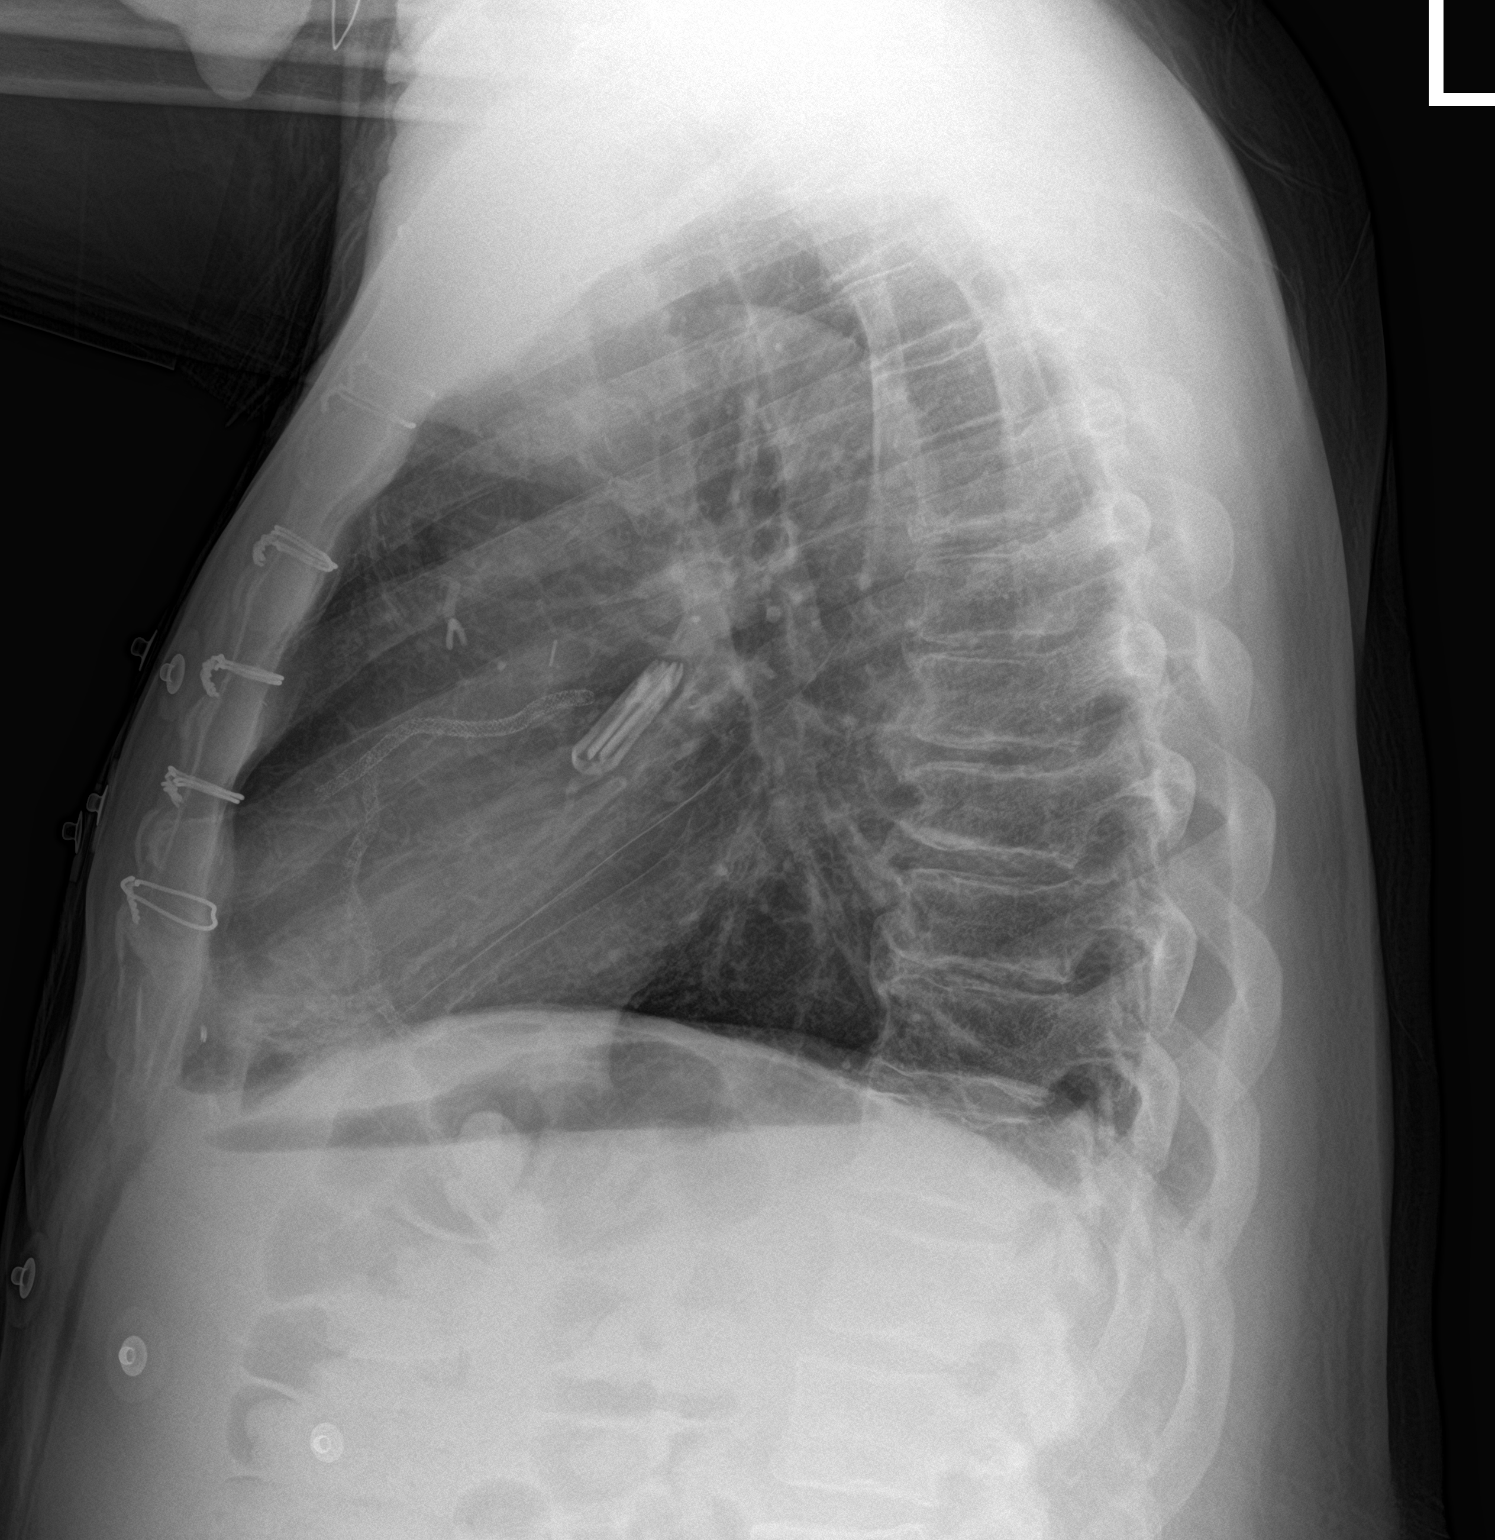

[2 of 2 positions shown; findings below may reference images not displayed]

FINDINGS: Post sternotomy changes and atrial appendage clip. No focal opacity,
pleural effusion or pneumothorax. Normal cardiomediastinal
silhouette. Hardware in the cervical spine.
IMPRESSION: No active cardiopulmonary disease.

## 2022-11-25 ENCOUNTER — Encounter: Payer: Self-pay | Admitting: *Deleted

## 2022-11-25 NOTE — Congregational Nurse Program (Signed)
  Dept: [phone]   Congregational Nurse Program Note  Date of Encounter: 11/25/2022  Past Medical History: Past Medical History:  Diagnosis Date   A-fib ([place])    Anemia    Asthma    No PFTs, history of childhood asthma   CAD (coronary artery disease)    Cellulitis 04/2014   left facial   Cellulitis and abscess of toe of right foot 12/08/2019   Chondromalacia of medial femoral condyle    Left knee MRI 04/28/12: Chondromalacia of the medial femoral condyle with slight peripheral degeneration of the meniscocapsular junction of the medial meniscus; followed by sports medicine   Chronic kidney failure, stage 4 (severe) ([place])    Collagen vascular disease ([name])    Crack cocaine use    for 20+ years, has been enrolled in detox programs in the past   Depression    with history of hospitalization for suicidal ideation   Diabetes mellitus 2002   Diagnosed in 2002, started insulin in 2012   Gout    Headache(784.0)    CT head 08/2011: Periventricular and subcortical white matter hypodensities are most in keeping with chronic microangiopathic change   HIV infection ([place]) 08/2011   Followed by [name]   Hyperlipidemia    Hypertension    Pulmonary embolism [facility])     Encounter Details:  CNP Questionnaire - 11/25/22 0954       Questionnaire   Ask client: Do you give verbal consent for me to treat you today? Yes    Student Assistance N/A    Location Patient Served  [facility]    Visit Setting with Client Organization    Patient Status Unhoused    Insurance Medicare    Insurance/Financial Assistance Referral N/A    Medication N/A    Medical Provider Yes    Screening Referrals Made N/A    Medical Referrals Made N/A    Medical Appointment Made N/A    Recently w/o PCP, now 1st time PCP visit completed due to CNs referral or appointment made N/A    Food N/A    Transportation N/A    Housing/Utilities No permanent housing;Referred to homeless shelter, day center    Interpersonal  Safety N/A    Interventions Advocate/Support    Abnormal to Normal Screening Since Last CN Visit N/A    Screenings CN Performed Blood Pressure    Sent Client to Lab for: N/A    Did client attend any of the following based off CNs referral or appointments made? N/A    ED Visit Averted N/A    Life-Saving Intervention Made N/A            Client seen at [facility] as f/u from ED visit. Client reports his needs are housing. Gave client a list of local shelters and discussed f/u with [facility]. Client reports he has access to all of his medications. Checked vitals. Blood pressure (!) 159/90. Client has not taken his medications today. Encouraged to take medications as prescribed. He is currently staying with a friend. Offered support and encouragement. Elmina Hendel W RN CN

## 2022-11-26 ENCOUNTER — Other Ambulatory Visit: Payer: Self-pay

## 2022-11-26 ENCOUNTER — Encounter (HOSPITAL_COMMUNITY): Payer: Self-pay

## 2022-11-26 ENCOUNTER — Emergency Department (HOSPITAL_COMMUNITY): Payer: 59

## 2022-11-26 ENCOUNTER — Emergency Department (HOSPITAL_COMMUNITY)
Admission: EM | Admit: 2022-11-26 | Discharge: 2022-11-27 | Disposition: A | Discharge status: Home / Self Care | Payer: 59 | Attending: Emergency Medicine | Admitting: Emergency Medicine

## 2022-11-26 DIAGNOSIS — E119 Type 2 diabetes mellitus without complications: Secondary | ICD-10-CM | POA: Insufficient documentation

## 2022-11-26 DIAGNOSIS — I2510 Atherosclerotic heart disease of native coronary artery without angina pectoris: Secondary | ICD-10-CM | POA: Insufficient documentation

## 2022-11-26 DIAGNOSIS — D649 Anemia, unspecified: Secondary | ICD-10-CM | POA: Diagnosis not present

## 2022-11-26 DIAGNOSIS — F1410 Cocaine abuse, uncomplicated: Secondary | ICD-10-CM | POA: Insufficient documentation

## 2022-11-26 DIAGNOSIS — Z1152 Encounter for screening for COVID-19: Secondary | ICD-10-CM | POA: Insufficient documentation

## 2022-11-26 DIAGNOSIS — R0602 Shortness of breath: Secondary | ICD-10-CM | POA: Insufficient documentation

## 2022-11-26 DIAGNOSIS — Z794 Long term (current) use of insulin: Secondary | ICD-10-CM | POA: Diagnosis not present

## 2022-11-26 DIAGNOSIS — R7989 Other specified abnormal findings of blood chemistry: Secondary | ICD-10-CM | POA: Insufficient documentation

## 2022-11-26 DIAGNOSIS — R45851 Suicidal ideations: Secondary | ICD-10-CM | POA: Insufficient documentation

## 2022-11-26 DIAGNOSIS — I10 Essential (primary) hypertension: Secondary | ICD-10-CM | POA: Diagnosis not present

## 2022-11-26 DIAGNOSIS — F329 Major depressive disorder, single episode, unspecified: Secondary | ICD-10-CM | POA: Insufficient documentation

## 2022-11-26 DIAGNOSIS — R079 Chest pain, unspecified: Secondary | ICD-10-CM

## 2022-11-26 DIAGNOSIS — Z951 Presence of aortocoronary bypass graft: Secondary | ICD-10-CM | POA: Diagnosis not present

## 2022-11-26 DIAGNOSIS — Z21 Asymptomatic human immunodeficiency virus [HIV] infection status: Secondary | ICD-10-CM | POA: Diagnosis not present

## 2022-11-26 DIAGNOSIS — Z79899 Other long term (current) drug therapy: Secondary | ICD-10-CM | POA: Diagnosis not present

## 2022-11-26 DIAGNOSIS — R0789 Other chest pain: Secondary | ICD-10-CM | POA: Diagnosis not present

## 2022-11-26 LAB — COMPREHENSIVE METABOLIC PANEL
ALT: 18 U/L (ref 0–44)
AST: 14 U/L — ABNORMAL LOW (ref 15–41)
Albumin: 3.5 g/dL (ref 3.5–5.0)
Alkaline Phosphatase: 61 U/L (ref 38–126)
Anion gap: 10 (ref 5–15)
BUN: 38 mg/dL — ABNORMAL HIGH (ref 8–23)
CO2: 19 mmol/L — ABNORMAL LOW (ref 22–32)
Calcium: 8.2 mg/dL — ABNORMAL LOW (ref 8.9–10.3)
Chloride: 111 mmol/L (ref 98–111)
Creatinine, Ser: 3.58 mg/dL — ABNORMAL HIGH (ref 0.61–1.24)
GFR, Estimated: 17 mL/min — ABNORMAL LOW (ref >60)
Glucose, Bld: 129 mg/dL — ABNORMAL HIGH (ref 70–99)
Potassium: 4 mmol/L (ref 3.5–5.1)
Sodium: 140 mmol/L (ref 135–145)
Total Bilirubin: 0.5 mg/dL (ref 0.3–1.2)
Total Protein: 6.3 g/dL — ABNORMAL LOW (ref 6.5–8.1)

## 2022-11-26 LAB — CBC
HCT: 26.4 % — ABNORMAL LOW (ref 39.0–52.0)
Hemoglobin: 8.1 g/dL — ABNORMAL LOW (ref 13.0–17.0)
MCH: 28.4 pg (ref 26.0–34.0)
MCHC: 30.7 g/dL (ref 30.0–36.0)
MCV: 92.6 fL (ref 80.0–100.0)
Platelets: 162 10*3/uL (ref 150–400)
RBC: 2.85 MIL/uL — ABNORMAL LOW (ref 4.22–5.81)
RDW: 13.8 % (ref 11.5–15.5)
WBC: 4.9 10*3/uL (ref 4.0–10.5)
nRBC: 0 % (ref 0.0–0.2)

## 2022-11-26 LAB — RAPID URINE DRUG SCREEN, HOSP PERFORMED
Amphetamines: NOT DETECTED
Barbiturates: NOT DETECTED
Benzodiazepines: NOT DETECTED
Cocaine: POSITIVE — AB
Opiates: NOT DETECTED
Tetrahydrocannabinol: NOT DETECTED

## 2022-11-26 LAB — SALICYLATE LEVEL: Salicylate Lvl: <7 mg/dL — ABNORMAL LOW (ref 7.0–30.0)

## 2022-11-26 LAB — TROPONIN I (HIGH SENSITIVITY)
Troponin I (High Sensitivity): 31 ng/L — ABNORMAL HIGH (ref <18)
Troponin I (High Sensitivity): 29 ng/L — ABNORMAL HIGH (ref <18)

## 2022-11-26 LAB — ETHANOL: Alcohol, Ethyl (B): <10 mg/dL (ref <10)

## 2022-11-26 LAB — ACETAMINOPHEN LEVEL: Acetaminophen (Tylenol), Serum: <10 ug/mL — ABNORMAL LOW (ref 10–30)

## 2022-11-26 NOTE — ED Notes (Signed)
Dinner tray ordered for patient.

## 2022-11-26 NOTE — Progress Notes (Signed)
Was done as part of medical clearance

## 2022-11-26 NOTE — BH Assessment (Signed)
Comprehensive Clinical Assessment (CCA) Note  11/26/2022 Michael [name] [id number]  Disposition: Clinical report given to Evette Georges, NP who recommends inpatient psychiatric admission. RN Amado Nash and Theressa Stamps, PA made aware of the recommendation.  The patient demonstrates the following risk factors for suicide: Chronic risk factors for suicide include: previous suicide attempts by cutting . Acute risk factors for suicide include: social withdrawal/isolation. Protective factors for this patient include: hope for the future. Considering these factors, the overall suicide risk at this point appears to be low. Patient is not appropriate for outpatient follow up.  Michael [name] is a 72 year-old widowed male who presents to [facility] ED via EMS. Patient reports a history of depression. Patient states he contacted EMS due to chest pain and being suicidal. He says "I don't like the way my life is, I have a lot of health problems." Patient denies having a plan. Additionally, patient endorses HI with no plan or identified person. Patient states he has been feeling this way a couple of months. Patient denies current auditory or visual hallucinations. Patient states he smokes crack cocaine and last smoked two days ago. Patient UDS is positive for cocaine. Patient denies any additional substance use.   Patient identifies his lifestyle as his primary stressor. Patient reports he has been homeless for about two years and spends most of his time at hotels or in the hospital. Patient states he has numerous health issues. He reports kidney problems, 2 heart attacks and mild strokes. He also uses a cane or walker for mobility. Patient denies having any supports, due to being estranged from family. Patient denies any history of abuse.   Patient is not currently receiving any mental health or substance use services. Patient reports he was discharged from the hospital to [facility]  in January, however they discharged him due to his health issues. Patient reports one previous psychiatric hospitalization a few years ago, due to a suicide attempt by cutting.  Patient presents dressed in scrubs, alert and oriented. Patient has normal speech and is coherent. Patient has good eye contact and there is no indication he is responding to internal stimuli. Patient is cooperative throughout the assessment.    Chief Complaint:  Chief Complaint  Patient presents with   Suicidal   Addiction Problem   Visit Diagnosis:  Cocaine abuse Suicidal ideation  CCA Screening, Triage and Referral (STR)  Patient Reported Information How did you hear about [place]? Other (Comment) (EMS)  What Is the Reason for Your Visit/Call Today? Patient states "I'm suicidal. I don't like the way my life is. I have a lot of health problems." Patient reports HI with no plan or intended target. Patient denies current AH/VH.  How Long Has This Been Causing You Problems? 1-6 months  What Do You Feel Would Help You the Most Today? Treatment for Depression or other mood problem; Alcohol or Drug Use Treatment; Housing Assistance   Have You Recently Had Any Thoughts About [place]? Yes  Are You Planning to Commit Suicide/Harm Yourself At This time? Yes   Flowsheet Row ED from 11/26/2022 in [facility] Emergency Department at [facility] ED from 11/16/2022 in [facility] Emergency Department at [facility] ED from 11/15/2022 in [facility] Emergency Department at [place] Low Risk Low Risk Error: Q3, 4, or 5 should not be populated when Q2 is No       Have you Recently Had Thoughts About  Hurting Someone [name]? Yes  Are You Planning to Harm Someone at This Time? No  Explanation: Pt has no specific plan or person identified.   Have You Used Any Alcohol or Drugs in the Past 24 Hours? Yes  What Did You Use and How Much? Pt reports smoking crack  cocaine.   Do You Currently Have a Therapist/Psychiatrist? No  Name of Therapist/Psychiatrist: Name of Therapist/Psychiatrist: N/A   Have You Been Recently Discharged From Any Office Practice or Programs? Yes  Explanation of Discharge From Practice/Program: Pt was discharged from [facility] in January due to health issues     CCA Screening Triage Referral Assessment Type of Contact: Tele-Assessment  Telemedicine Service Delivery: Telemedicine service delivery: This service was provided via telemedicine using a 2-way, interactive audio and video technology  Is this Initial or Reassessment? Is this Initial or Reassessment?: Initial Assessment  Date Telepsych consult ordered in [facility]:  Date Telepsych consult ordered in [facility]: 11/26/22  Time Telepsych consult ordered in [facility]:  Time Telepsych consult ordered in [facility]: [id number]  Location of Assessment: [facility] ED  Provider Location: [facility] Assessment Services   Collateral Involvement: None   Does Patient Have a [place]? No  Legal Guardian Contact Information: N/A  Copy of Legal Guardianship Form: -- (N/A)  Legal Guardian Notified of Arrival: -- (N/A)  Legal Guardian Notified of Pending Discharge: -- (N/A)  If Minor and Not Living with Parent(s), Who has Custody? N/A  Is CPS involved or ever been involved? Never  Is APS involved or ever been involved? Never   Patient Determined To Be At Risk for Harm To Self or Others Based on Review of Patient Reported Information or Presenting Complaint? Yes, for Self-Harm (Also reports HI)  Method: Plan without intent  Availability of Means: No access or NA (Has no identified plan)  Intent: Clearly intends on inflicting harm that could cause death  Notification Required: No need or identified person  Additional Information for Danger to Others Potential: -- (N/A)  Additional Comments for Danger to Others Potential: N/A  Are There Guns or Other  Weapons in Your Home? No  Types of Guns/Weapons: N/A  Are These Weapons Safely Secured?                            -- (N/A)  Who Could Verify You Are Able To Have These Secured: N/A  Do You Have any Outstanding Charges, Pending Court Dates, Parole/Probation? No  Contacted To Inform of Risk of Harm To Self or Others: -- (N/A)    Does Patient Present under Involuntary Commitment? No    [place] of Residence: [place]   Patient Currently Receiving the Following Services: Not Receiving Services   Determination of Need: Emergent (2 hours)   Options For Referral: Inpatient Hospitalization     CCA Biopsychosocial Patient Reported Schizophrenia/Schizoaffective Diagnosis in Past: No   Strengths: Patient is seeking help   Mental Health Symptoms Depression:   Sleep (too much or little); Hopelessness   Duration of Depressive symptoms:  Duration of Depressive Symptoms: Greater than two weeks   Mania:   None   Anxiety:    None   Psychosis:   None   Duration of Psychotic symptoms:    Trauma:   None   Obsessions:   None   Compulsions:   None   Inattention:   None   Hyperactivity/Impulsivity:   None   Oppositional/Defiant Behaviors:  None   Emotional Irregularity:   None   Other Mood/Personality Symptoms:   N/A    Mental Status Exam Appearance and self-care  Stature:   Average   Weight:   Average weight   Clothing:   -- (Hosptial scrubs)   Grooming:   Normal   Cosmetic use:   None   Posture/gait:   Normal   Motor activity:   Not Remarkable   Sensorium  Attention:   Normal   Concentration:   Normal   Orientation:   X5   Recall/memory:   Normal   Affect and Mood  Affect:   Congruent   Mood:   Hopeless   Relating  Eye contact:   Normal   Facial expression:   Responsive   Attitude toward examiner:   Cooperative   Thought and Language  Speech flow:  Normal   Thought content:   Appropriate to Mood and  Circumstances   Preoccupation:   None   Hallucinations:   None   Organization:   Linear   Transport planner of Knowledge:   Average   Intelligence:   Average   Abstraction:   Normal   Judgement:   Impaired   Reality Testing:   Adequate   Insight:   Lacking   Decision Making:   Impulsive   Social Functioning  Social Maturity:   Isolates   Social Judgement:   "Street Smart"   Stress  Stressors:   Housing; Other (Comment) (Substance use)   Coping Ability:   Exhausted   Skill Deficits:   None   Supports:   Support needed     Religion: Religion/Spirituality Are You A Religious Person?: Yes What is Your Religious Affiliation?: Methodist How Might This Affect Treatment?: N/A  Leisure/Recreation: Leisure / Recreation Do You Have Hobbies?: Yes Leisure and Hobbies: Fishing  Exercise/Diet: Exercise/Diet Do You Exercise?: No Have You Gained or Lost A Significant Amount of Weight in the Past Six Months?: No Do You Have Any Trouble Sleeping?: Yes Explanation of Sleeping Difficulties: Patient reports poor sleep   CCA Employment/Education Employment/Work Situation: Employment / Work Technical sales engineer: On disability Why is Patient on Disability: Health How Long has Patient Been on Disability: Many years Patient's Job has Been Impacted by Current Illness: No Has Patient ever Been in the [employer]?: No  Education: Education Is Patient Currently Attending School?: No Last Grade Completed: 12 Did You Attend College?: No Did You Have An Individualized Education Program (IIEP): No Did You Have Any Difficulty At School?: No Patient's Education Has Been Impacted by Current Illness: No   CCA Family/Childhood History Family and Relationship History: Family history Marital status: Widowed Widowed, when?: 2 years ago Does patient have children?: Yes How many children?: 2 How is patient's relationship with their children?:  Poor  Childhood History:  Childhood History By whom was/is the patient raised?: Both parents Did patient suffer any verbal/emotional/physical/sexual abuse as a child?: No Did patient suffer from severe childhood neglect?: No Has patient ever been sexually abused/assaulted/raped as an adolescent or adult?: No Was the patient ever a victim of a crime or a disaster?: No Witnessed domestic violence?: No Has patient been affected by domestic violence as an adult?: No       CCA Substance Use Alcohol/Drug Use: Alcohol / Drug Use Pain Medications: See MAR Prescriptions: See MAR Over the Counter: See MAR History of alcohol / drug use?: No history of alcohol / drug abuse Longest period of sobriety (when/how long): 3  years Negative Consequences of Use: Personal relationships Withdrawal Symptoms: None                         ASAM's:  Six Dimensions of Multidimensional Assessment  Dimension 1:  Acute Intoxication and/or Withdrawal Potential:   Dimension 1:  Description of individual's past and current experiences of substance use and withdrawal: Patient denies withdrawal symptoms  Dimension 2:  Biomedical Conditions and Complications:   Dimension 2:  Description of patient's biomedical conditions and  complications: Patient reports a number of health issues and frequent hospitalizations  Dimension 3:  Emotional, Behavioral, or Cognitive Conditions and Complications:  Dimension 3:  Description of emotional, behavioral, or cognitive conditions and complications: Patient reports SI and HI, with no plan or identified person.  Dimension 4:  Readiness to Change:  Dimension 4:  Description of Readiness to Change criteria: Patient continues to smoke crack cocaine, although health professionals educate on its contribution to his health.  Dimension 5:  Relapse, Continued use, or Continued Problem Potential:  Dimension 5:  Relapse, continued use, or continued problem potential critiera  description: Patient has continued relapse  Dimension 6:  Recovery/Living Environment:  Dimension 6:  Recovery/Iiving environment criteria description: Patient is currently homeless  ASAM Severity Score: ASAM's Severity Rating Score: 14  ASAM Recommended Level of Treatment: ASAM Recommended Level of Treatment: Level III Residential Treatment   Substance use Disorder (SUD) Substance Use Disorder (SUD)  Checklist Symptoms of Substance Use: Continued use despite having a persistent/recurrent physical/psychological problem caused/exacerbated by use, Persistent desire or unsuccessful efforts to cut down or control use, Presence of craving or strong urge to use, Continued use despite persistent or recurrent social, interpersonal problems, caused or exacerbated by use  Recommendations for Services/Supports/Treatments: Recommendations for Services/Supports/Treatments Recommendations For Services/Supports/Treatments: Inpatient Hospitalization  Discharge Disposition:    DSM5 Diagnoses: Patient Active Problem List   Diagnosis Date Noted   AKI (acute kidney injury) ([place]) 10/21/2022   Hyperkalemia 10/05/2022   CKD (chronic kidney disease), stage IV ([place]) 09/23/2022   Polysubstance abuse ([place]) 09/13/2022   ARF (acute renal failure) ([place]) 09/09/2022   (HFpEF) heart failure with preserved ejection fraction ([place]) 09/09/2022   Elevated troponin 09/09/2022   Impaired functional mobility, balance, and endurance 11/29/2021   Transfusion history 11/29/2021   Unstable balance 11/29/2021   Dilated cardiomyopathy ([place]) 11/29/2021   Arthritis 11/29/2021   Diabetic foot infection ([place]) 06/20/2021   Presence of aortocoronary bypass graft 04/16/2021   Iron deficiency anemia 04/04/2021   Atypical chest pain 04/03/2021   Anemia of chronic disease 12/14/2020   Type 2 diabetes mellitus with hyperlipidemia ([place]) 08/14/2020   Idiopathic chronic gout of multiple sites without tophus 02/17/2020   Diabetic ulcer of  toe of right foot associated with type 2 diabetes mellitus ([place]) 12/17/2019   Vitamin B12 deficiency 12/17/2019   Homelessness 12/13/2019   Cellulitis 12/07/2019   Osteomyelitis ([place]) 07/17/2019   Abscess or cellulitis of toe, right    Toe pain, right-second    MDD (major depressive disorder), recurrent episode, severe ([place]) 06/15/2019   Respiratory distress 04/12/2019   Pneumonia due to severe acute respiratory syndrome coronavirus 2 (SARS-CoV-2) 04/11/2019   COVID-19 virus infection 03/19/2019   Chest pain 10/03/2018   Malnutrition of moderate degree 09/21/2018   MDD (major depressive disorder), recurrent episode, moderate ([name])    Type II diabetes mellitus with renal manifestations ([place]) 05/04/2018   GERD (gastroesophageal reflux disease) 05/04/2018   S/P CABG (coronary artery  bypass graft)    Left testicular pain    Depression 02/20/2018   Epididymo-orchitis, acute 02/20/2018   Essential hypertension 02/20/2018   Precordial chest pain 02/20/2018   Acute kidney injury superimposed on chronic kidney disease ([place]) 02/14/2018   HCAP (healthcare-associated pneumonia) 02/14/2018   History of pulmonary embolism 07/04/2017   Acute hyponatremia 05/11/2017   Hyperglycemia due to type 2 diabetes mellitus ([place]) 05/11/2017   CHF (congestive heart failure) ([place]) 04/01/2017   Hepatitis C 12/30/2016   Substance induced mood disorder ([place]) 11/23/2016   Chronic ischemic heart disease 11/12/2016   Paroxysmal A-fib ([place]) 11/12/2016   Back pain 04/18/2016   S/P carotid endarterectomy 11/15/2015   COPD (chronic obstructive pulmonary disease) ([place]) 10/22/2015   Stenosis of cervical spine with myelopathy ([place]) 10/20/2015   MDD (major depressive disorder), recurrent severe, without psychosis ([place]) 09/09/2015   History of fusion of cervical spine 08/28/2015   Cocaine-induced mood disorder ([place]) 08/14/2015   Cocaine abuse with cocaine-induced mood disorder ([place]) 08/14/2015   Gout 07/10/2015   Acute  renal failure superimposed on stage 3 chronic kidney disease ([place]) 03/06/2015   Anemia 03/06/2015   Chronic kidney disease, stage III (moderate) ([place]) 03/06/2015   Hypoglycemia    Encounter for general adult medical examination with abnormal findings 02/09/2015   Cocaine use disorder, moderate, dependence ([place]) 12/13/2014   Substance or medication-induced depressive disorder with onset during withdrawal ([place]) 12/13/2014   Severe recurrent major depressive disorder with psychotic features ([place]) 12/12/2014   Major depressive disorder, recurrent, severe without psychotic features ([place])    Suicidal ideations 08/15/2014   Cervicalgia 06/28/2014   Lumbar radiculopathy, chronic 06/28/2014   Asthma, chronic 02/03/2014   S/P percutaneous transluminal coronary angioplasty 10/15/2013   3-vessel CAD 06/24/2013   ED (erectile dysfunction) of organic origin 07/07/2012   Cocaine abuse, continuous ([place]) 05/13/2012   Hypertension goal BP (blood pressure) < 140/80 04/29/2012   Chondromalacia of left knee 03/19/2012   Hyperlipidemia with target LDL less than 100 02/12/2012   Fibromyalgia 02/12/2012   Cocaine use disorder ([place]) 01/10/2012    Class: Acute   Human immunodeficiency virus (HIV) disease ([place]) 12/16/2011   HIV (human immunodeficiency virus infection) ([place]) 08/27/2011   Uncontrolled type 2 diabetes with neuropathy 10/17/2000     Referrals to Alternative Service(s): Referred to Alternative Service(s):   Place:   Date:   Time:    Referred to Alternative Service(s):   Place:   Date:   Time:    Referred to Alternative Service(s):   Place:   Date:   Time:    Referred to Alternative Service(s):   Place:   Date:   Time:     Michael Boga, LCSW

## 2022-11-26 NOTE — ED Notes (Signed)
Pt is not IVC, voluntary form on clipboard in [place]

## 2022-11-26 NOTE — ED Notes (Signed)
Patient belongings put in locker #3. 2 bags. Patient states he did not bring a phone with him.

## 2022-11-26 NOTE — ED Provider Triage Note (Signed)
Emergency Medicine Provider Triage Evaluation Note  Michael [name] , a 72 y.o. male  was evaluated in triage.  Pt complains of chest pain that has worsened over the past 4 days.  Patient went to police station and informed they were having chest pain, EMS was called.  He has had chest pain for the past year since having heart surgery.  He reports using crack cocaine last night.  He also has suicidal ideations without definitive plan, reports in the past he has attempted to hurt himself, but has not done so today.  Denies HI.  Has associated shortness of breath with his chest pain.  Review of Systems  Positive: As above Negative: As above  Physical Exam  BP (!) 137/94   Pulse 79   Temp 98.5 F (36.9 C)   Resp 16   Ht 5' 8"$  (1.727 m)   Wt 72.6 kg   SpO2 100%   BMI 24.33 kg/m  Gen:   Awake, no distress   Resp:  Normal effort  MSK:   Moves extremities without difficulty  Other:  Heart rate normal with irregular rhythm  Medical Decision Making  Medically screening exam initiated at 12:47 PM.  Appropriate orders placed.  [name] was informed that the remainder of the evaluation will be completed by another provider, this initial triage assessment does not replace that evaluation, and the importance of remaining in the ED until their evaluation is complete.     Theressa Stamps R, [place] 11/26/22 1249

## 2022-11-26 NOTE — ED Notes (Signed)
Voluntary consent form is attached to clipboard in [place] zzone

## 2022-11-26 NOTE — ED Provider Notes (Cosign Needed Addendum)
Michael [name]   CSN: [id number] Arrival date & time: 11/26/22  1230     History  Chief Complaint  Patient presents with   Suicidal    Michael [name] is a 72 y.o. male with PMH significant for DM, HLD, HIV, HTN, Afib, CAD, crack cocaine use, CABG presents to the ED via EMS complaining of chest pain and suicidal ideations.  Patient went to the police station earlier today and told staff there he was having chest pain.  He states he has had chest pain for the past year, but it has been worsening over the past 4 days.  Patient reports using crack cocaine last night.  EMS gave patient 1 SL nitro and 324 mg ASA.  He endorses associated shortness of breath with his chest pain.  Denies cough, chest tightness, palpitations, fever, hallucinations, self-injury, confusion.         Home Medications Prior to Admission medications   Medication Sig Start Date End Date Taking? Authorizing Provider  Accu-Chek Softclix Lancets lancets Use as directed up to four times daily 09/06/22   Elodia Florence., MD  albuterol (VENTOLIN HFA) 108 (90 Base) MCG/ACT inhaler Inhale 1-2 puffs into the lungs every 6 (six) hours as needed for wheezing or shortness of breath. Patient not taking: Reported on 11/14/2022 10/24/22   [place] Donnamarie Poag, DO  atorvastatin (LIPITOR) 40 MG tablet Take 1 tablet (40 mg total) by mouth daily. Patient not taking: Reported on 11/14/2022 10/24/22 11/23/22  [place] Eric J, DO  Blood Glucose Monitoring Suppl (ACCU-CHEK GUIDE) w/Device KIT Use as directed. 09/06/22   Elodia Florence., MD  budesonide-formoterol [facility]) 160-4.5 MCG/ACT inhaler Inhale 2 puffs into the lungs in the morning and at bedtime. Patient not taking: Reported on 11/14/2022 10/24/22   [place] Donnamarie Poag, DO  diltiazem (CARDIZEM CD) 120 MG 24 hr capsule Take 1 capsule (120 mg total) by mouth daily. Patient not taking: Reported on 11/14/2022 10/24/22 11/23/22   [place] Eric J, DO  ferrous sulfate 325 (65 FE) MG tablet Take 1 tablet (325 mg total) by mouth daily with breakfast. Patient not taking: Reported on 11/14/2022 10/24/22 11/23/22  [place] Donnamarie Poag, DO  FLUoxetine (PROZAC) 20 MG capsule Take 1 capsule (20 mg total) by mouth daily. Patient not taking: Reported on 11/14/2022 10/24/22 11/23/22  [place] Eric J, DO  gabapentin (NEURONTIN) 300 MG capsule Take 1 capsule (300 mg total) by mouth at bedtime. Patient not taking: Reported on 11/14/2022 10/24/22 11/23/22  [place] Eric J, DO  glucose blood test strip use as directed up to four times daily 09/06/22   Elodia Florence., MD  hydrALAZINE (APRESOLINE) 50 MG tablet Take 1 tablet (50 mg total) by mouth every 8 (eight) hours. Patient not taking: Reported on 11/14/2022 10/24/22 11/23/22  [place] Donnamarie Poag, DO  Insulin Pen Needle 31G X 5 MM MISC Use with insulin pens Patient taking differently: 1 each by Other route See admin instructions. Use with insulin pens 10/24/21   [name], MD  Insulin Pen Needle 32G X 4 MM MISC Use as directed up to 4 times daily. 09/06/22   Elodia Florence., MD  isosorbide mononitrate (IMDUR) 30 MG 24 hr tablet Take 0.5 tablets (15 mg total) by mouth daily. Patient not taking: Reported on 11/14/2022 10/24/22 11/23/22  [place] Eric J, DO  meclizine (ANTIVERT) 25 MG tablet Take 1 tablet (25 mg total) by mouth 3 (three) times  daily as needed for dizziness. Patient not taking: Reported on 10/22/2022 10/18/22   [place], Jerald Kief A, MD  nitroGLYCERIN (NITROSTAT) 0.4 MG SL tablet Place 1 tablet (0.4 mg total) under the tongue every 5 (five) minutes as needed for chest pain. Patient not taking: Reported on 10/22/2022 10/24/21   [name], MD  polyethylene glycol powder [facility]) 17 GM/SCOOP powder Take 17 g by mouth 2 (two) times daily. Patient not taking: Reported on 10/22/2022 10/18/22   Elmarie Shiley, MD      Allergies    Patient has no known allergies.    Review  of Systems   Review of Systems  Constitutional:  Negative for fever.  Respiratory:  Positive for shortness of breath. Negative for cough and chest tightness.   Cardiovascular:  Positive for chest pain. Negative for palpitations.  Psychiatric/Behavioral:  Positive for suicidal ideas (without definitive plan). Negative for confusion, hallucinations and self-injury.     Physical Exam Updated Vital Signs BP (!) 187/70   Pulse 74   Temp 98.5 F (36.9 C)   Resp 16   Ht 5' 8"$  (1.727 m)   Wt 72.6 kg   SpO2 100%   BMI 24.33 kg/m  Physical Exam Vitals and nursing [name] reviewed.  Constitutional:      General: He is not in acute distress.    Appearance: He is not ill-appearing.  HENT:     Mouth/Throat:     Mouth: Mucous membranes are moist.     Pharynx: Oropharynx is clear.  Cardiovascular:     Rate and Rhythm: Normal rate. Rhythm irregular.     Pulses: Normal pulses.     Heart sounds: Normal heart sounds.  Pulmonary:     Effort: Pulmonary effort is normal. No respiratory distress.     Breath sounds: Normal breath sounds and air entry.  Chest:     Chest wall: Tenderness present. No mass or deformity.    Abdominal:     General: Abdomen is flat. Bowel sounds are normal. There is no distension.     Palpations: Abdomen is soft.     Tenderness: There is no abdominal tenderness.  Musculoskeletal:     Right lower leg: No edema.     Left lower leg: No edema.  Skin:    General: Skin is warm and dry.     Capillary Refill: Capillary refill takes less than 2 seconds.  Neurological:     Mental Status: He is alert. Mental status is at baseline.  Psychiatric:        Mood and Affect: Mood is depressed. Affect is flat.        Speech: Speech normal.        Behavior: Behavior is withdrawn.        Thought Content: Thought content includes suicidal ideation. Thought content does not include homicidal ideation. Thought content does not include homicidal or suicidal plan.     ED Results /  Procedures / Treatments   Labs (all labs ordered are listed, but only abnormal results are displayed) Labs Reviewed  CBC - Abnormal; Notable for the following components:      Result Value   RBC 2.85 (*)    Hemoglobin 8.1 (*)    HCT 26.4 (*)    All other components within normal limits  COMPREHENSIVE METABOLIC PANEL - Abnormal; Notable for the following components:   CO2 19 (*)    Glucose, Bld 129 (*)    BUN 38 (*)    Creatinine, Ser  3.58 (*)    Calcium 8.2 (*)    Total Protein 6.3 (*)    AST 14 (*)    GFR, Estimated 17 (*)    All other components within normal limits  SALICYLATE LEVEL - Abnormal; Notable for the following components:   Salicylate Lvl Q000111Q (*)    All other components within normal limits  ACETAMINOPHEN LEVEL - Abnormal; Notable for the following components:   Acetaminophen (Tylenol), Serum <10 (*)    All other components within normal limits  TROPONIN I (HIGH SENSITIVITY) - Abnormal; Notable for the following components:   Troponin I (High Sensitivity) 31 (*)    All other components within normal limits  TROPONIN I (HIGH SENSITIVITY) - Abnormal; Notable for the following components:   Troponin I (High Sensitivity) 29 (*)    All other components within normal limits  ETHANOL  RAPID URINE DRUG SCREEN, HOSP PERFORMED    EKG EKG Interpretation  Date/Time:  Tuesday November 26 2022 12:44:07 EST Ventricular Rate:  99 PR Interval:  53 QRS Duration: 84 QT Interval:  382 QTC Calculation: 391 R Axis:   79 Text Interpretation: Sinus or ectopic atrial rhythm Short PR interval Probable LVH with secondary repol abnrm No significant change since last tracing Confirmed by [name] [phone]) on 11/26/2022 3:26:52 PM  Radiology DG Chest 2 View  Result Date: 11/26/2022 CLINICAL DATA:  Chest pain EXAM: CHEST - 2 VIEW COMPARISON:  CXR 11/16/22 FINDINGS: No pleural effusion. No pneumothorax. Status post median sternotomy. Coronary stent in place. Atrial appendage occluded  device is unchanged in positioning. Partially visualized cervical spinal fusion hardware in place. Unchanged cardiac and mediastinal contours. No focal airspace opacity. No radiographically apparent displaced rib fractures. Degenerative changes of the bilateral AC joints, right-greater-than-left. Visualized upper abdomen is unremarkable. IMPRESSION: No radiographic finding to explain chest pain. Electronically Signed   By: Marin Roberts M.D.   On: 11/26/2022 13:35    Procedures Procedures    Medications Ordered in ED Medications - No data to display  ED Course/ Medical Decision Making/ A&P                             Medical Decision Making Amount and/or Complexity of Data Reviewed Labs: ordered. Radiology: ordered.   This patient presents to the ED with chief complaint(s) of chest pain, shortness of breath, and suicidal ideations with pertinent past medical history of DM, CAD, CABG, HTN, HLD, crack cocaine use, HIV.  The complaint involves an extensive differential diagnosis and also carries with it a high risk of complications and morbidity.    The differential diagnosis includes ACS, angina, pleuritic chest pain, musculoskeletal chest pain, chest pain secondary to cocaine use, MDD  The initial plan is to obtain medical clearance and do chest pain workup   Additional history obtained: Additional history obtained from EMS  Records reviewed  previous ED documentation, patient frequently comes to ED for cocaine abuse and chest pain; does not appear to have established primary care   Initial Assessment:   Exam significant for reproducible anterior chest wall pain with palpation just left of the sternum.  Lungs are clear to auscultation bilaterally.  Heart rate is normal and irregular.  Skin warm and dry.  Patient does not make eye contact during conversation, but does not appear to be responding to internal stimuli.  He is cooperative.  Depressed mood with flat affect.  Denies visual or  auditory hallucinations.  Endorses  suicidal thoughts without definitive plan.    Independent ECG/labs interpretation:  The following labs were independently interpreted:  CBC with chronic anemia, no leukocytosis.  Metabolic panel with chronic impaired renal function, appears to be around patient's baseline; no major electrolyte disturbances aside from mild hypocalcemia.  Initial troponin 31, repeat 29 which also appears chronic and likely related to patient's crack cocaine use.  Negative for ethanol, acetaminophen, salicylates.    Independent visualization and interpretation of imaging: I independently visualized the following imaging with scope of interpretation limited to determining acute life threatening conditions related to emergency care: chest x-ray, which revealed no acute cardiopulmonary process, no pneumonia, pulmonary edema, or pleural effusion.  I agree with radiologist interpretation.    Treatment and Reassessment: Patient has been appropriately medically cleared and ED psych hold orders were placed due to patient having suicidal ideations.  Patient's chest pain appears to be chronic since his CABG and is presenting similarly today as it has in the past.   Disposition:   Patient to be evaluated by TTS and will follow their guidance/recommendations regarding disposition.    Social Determinants of Health:   Patient's unhoused, impaired access to primary care, lack of insurance increases the complexity of managing their presentation         Final Clinical Impression(s) / ED Diagnoses Final diagnoses:  Suicidal ideation  Cocaine abuse ([place])  Chest pain, unspecified type    Rx / DC Orders ED Discharge Orders     None         Pat Kocher, PA 11/26/22 1759    Pat Kocher, PA 11/26/22 1803    [name], MD 11/27/22 [phone]

## 2022-11-26 NOTE — ED Triage Notes (Addendum)
Pt arrives via EMS.Pt went to the police station and told staff there that he was having chest pain. Pt reports chest pain for 1 year, states over the past 4 days the pain has become worse. PT States for the past few months he has been having thoughts of suicide. Pt reports using crack cocaine last night. Pt is AxOx4. NAD. Pt received 1 SL nitro and 359m ASA by ems

## 2022-11-26 NOTE — ED Notes (Signed)
Straight stick unsuccessful. Phlebotomy notified for help.

## 2022-11-27 ENCOUNTER — Inpatient Hospital Stay
Admission: AD | Admit: 2022-11-27 | Discharge: 2022-12-09 | DRG: 885 | Disposition: A | Discharge status: Home / Self Care | Payer: 59 | Source: Intra-hospital | Attending: Psychiatry | Admitting: Psychiatry

## 2022-11-27 ENCOUNTER — Encounter: Payer: Self-pay | Admitting: Psychiatry

## 2022-11-27 ENCOUNTER — Other Ambulatory Visit: Payer: Self-pay

## 2022-11-27 DIAGNOSIS — E785 Hyperlipidemia, unspecified: Secondary | ICD-10-CM | POA: Diagnosis present

## 2022-11-27 DIAGNOSIS — Z7951 Long term (current) use of inhaled steroids: Secondary | ICD-10-CM | POA: Diagnosis not present

## 2022-11-27 DIAGNOSIS — R45851 Suicidal ideations: Secondary | ICD-10-CM | POA: Diagnosis present

## 2022-11-27 DIAGNOSIS — Z955 Presence of coronary angioplasty implant and graft: Secondary | ICD-10-CM | POA: Diagnosis not present

## 2022-11-27 DIAGNOSIS — N184 Chronic kidney disease, stage 4 (severe): Secondary | ICD-10-CM | POA: Diagnosis present

## 2022-11-27 DIAGNOSIS — Z951 Presence of aortocoronary bypass graft: Secondary | ICD-10-CM

## 2022-11-27 DIAGNOSIS — F1410 Cocaine abuse, uncomplicated: Secondary | ICD-10-CM | POA: Diagnosis present

## 2022-11-27 DIAGNOSIS — Z9151 Personal history of suicidal behavior: Secondary | ICD-10-CM | POA: Diagnosis not present

## 2022-11-27 DIAGNOSIS — E1122 Type 2 diabetes mellitus with diabetic chronic kidney disease: Secondary | ICD-10-CM | POA: Diagnosis present

## 2022-11-27 DIAGNOSIS — Z5900 Homelessness unspecified: Secondary | ICD-10-CM

## 2022-11-27 DIAGNOSIS — F332 Major depressive disorder, recurrent severe without psychotic features: Principal | ICD-10-CM | POA: Diagnosis present

## 2022-11-27 DIAGNOSIS — J4489 Other specified chronic obstructive pulmonary disease: Secondary | ICD-10-CM | POA: Diagnosis present

## 2022-11-27 DIAGNOSIS — Z21 Asymptomatic human immunodeficiency virus [HIV] infection status: Secondary | ICD-10-CM | POA: Diagnosis present

## 2022-11-27 DIAGNOSIS — I130 Hypertensive heart and chronic kidney disease with heart failure and stage 1 through stage 4 chronic kidney disease, or unspecified chronic kidney disease: Secondary | ICD-10-CM | POA: Diagnosis present

## 2022-11-27 DIAGNOSIS — Z86711 Personal history of pulmonary embolism: Secondary | ICD-10-CM

## 2022-11-27 DIAGNOSIS — I4891 Unspecified atrial fibrillation: Secondary | ICD-10-CM | POA: Diagnosis present

## 2022-11-27 DIAGNOSIS — F411 Generalized anxiety disorder: Secondary | ICD-10-CM | POA: Diagnosis present

## 2022-11-27 DIAGNOSIS — I25118 Atherosclerotic heart disease of native coronary artery with other forms of angina pectoris: Secondary | ICD-10-CM | POA: Diagnosis present

## 2022-11-27 DIAGNOSIS — G4700 Insomnia, unspecified: Secondary | ICD-10-CM | POA: Diagnosis present

## 2022-11-27 LAB — GLUCOSE, CAPILLARY
Glucose-Capillary: 99 mg/dL (ref 70–99)
Glucose-Capillary: 139 mg/dL — ABNORMAL HIGH (ref 70–99)

## 2022-11-27 LAB — CBG MONITORING, ED: Glucose-Capillary: 123 mg/dL — ABNORMAL HIGH (ref 70–99)

## 2022-11-27 LAB — SARS CORONAVIRUS 2 BY RT PCR: SARS Coronavirus 2 by RT PCR: NEGATIVE

## 2022-11-27 MED ORDER — ISOSORBIDE MONONITRATE ER 30 MG PO TB24
15.0000 mg | ORAL_TABLET | Freq: Every day | ORAL | Status: DC
  Administered 2022-11-27: 15 mg via ORAL
  Filled 2022-11-27: qty 1

## 2022-11-27 MED ORDER — ZIPRASIDONE MESYLATE 20 MG IM SOLR: 20.0000 mg | INTRAMUSCULAR | Status: DC | PRN

## 2022-11-27 MED ORDER — ATORVASTATIN CALCIUM 10 MG PO TABS
40.0000 mg | ORAL_TABLET | Freq: Every day | ORAL | Status: DC
  Administered 2022-11-28 – 2022-12-09 (×12): 40 mg via ORAL
  Filled 2022-11-27 (×12): qty 4

## 2022-11-27 MED ORDER — INSULIN ASPART 100 UNIT/ML IJ SOLN: 0.0000 [IU] | Freq: Every day | INTRAMUSCULAR | Status: DC

## 2022-11-27 MED ORDER — ALBUTEROL SULFATE HFA 108 (90 BASE) MCG/ACT IN AERS
1.0000 | INHALATION_SPRAY | Freq: Four times a day (QID) | RESPIRATORY_TRACT | Status: DC | PRN
  Administered 2022-12-03: 2 via RESPIRATORY_TRACT
  Filled 2022-11-27: qty 6.7

## 2022-11-27 MED ORDER — ISOSORBIDE MONONITRATE ER 30 MG PO TB24
15.0000 mg | ORAL_TABLET | Freq: Every day | ORAL | Status: DC
  Administered 2022-11-28 – 2022-12-09 (×12): 15 mg via ORAL
  Filled 2022-11-27 (×12): qty 1

## 2022-11-27 MED ORDER — FLUTICASONE FUROATE-VILANTEROL 200-25 MCG/ACT IN AEPB
1.0000 | INHALATION_SPRAY | Freq: Every day | RESPIRATORY_TRACT | Status: DC
  Administered 2022-11-28 – 2022-12-09 (×12): 1 via RESPIRATORY_TRACT
  Filled 2022-11-27: qty 28

## 2022-11-27 MED ORDER — DILTIAZEM HCL ER COATED BEADS 120 MG PO CP24
120.0000 mg | ORAL_CAPSULE | Freq: Every day | ORAL | Status: DC
  Administered 2022-11-27: 120 mg via ORAL
  Filled 2022-11-27: qty 1

## 2022-11-27 MED ORDER — INSULIN ASPART 100 UNIT/ML IJ SOLN
0.0000 [IU] | Freq: Three times a day (TID) | INTRAMUSCULAR | Status: DC
  Administered 2022-11-27: 1 [IU] via SUBCUTANEOUS

## 2022-11-27 MED ORDER — FLUOXETINE HCL 20 MG PO CAPS
20.0000 mg | ORAL_CAPSULE | Freq: Every day | ORAL | Status: DC
  Administered 2022-11-28: 20 mg via ORAL
  Filled 2022-11-27: qty 1

## 2022-11-27 MED ORDER — LORAZEPAM 1 MG PO TABS: 1.0000 mg | ORAL_TABLET | ORAL | Status: DC | PRN

## 2022-11-27 MED ORDER — GABAPENTIN 300 MG PO CAPS
300.0000 mg | ORAL_CAPSULE | Freq: Every day | ORAL | Status: DC
  Administered 2022-11-27: 300 mg via ORAL
  Filled 2022-11-27: qty 1

## 2022-11-27 MED ORDER — POLYETHYLENE GLYCOL 3350 17 GM/SCOOP PO POWD
17.0000 g | Freq: Two times a day (BID) | ORAL | Status: DC
  Filled 2022-11-27: qty 255

## 2022-11-27 MED ORDER — ACETAMINOPHEN 325 MG PO TABS
650.0000 mg | ORAL_TABLET | Freq: Four times a day (QID) | ORAL | Status: DC | PRN
  Administered 2022-11-28 – 2022-12-07 (×9): 650 mg via ORAL
  Filled 2022-11-27 (×9): qty 2

## 2022-11-27 MED ORDER — POLYETHYLENE GLYCOL 3350 17 G PO PACK
17.0000 g | PACK | Freq: Two times a day (BID) | ORAL | Status: DC
  Administered 2022-11-29 – 2022-12-08 (×14): 17 g via ORAL
  Filled 2022-11-27 (×20): qty 1

## 2022-11-27 MED ORDER — DILTIAZEM HCL ER COATED BEADS 120 MG PO CP24
120.0000 mg | ORAL_CAPSULE | Freq: Every day | ORAL | Status: DC
  Administered 2022-11-28 – 2022-12-09 (×12): 120 mg via ORAL
  Filled 2022-11-27 (×12): qty 1

## 2022-11-27 MED ORDER — FLUTICASONE FUROATE-VILANTEROL 200-25 MCG/ACT IN AEPB
1.0000 | INHALATION_SPRAY | Freq: Every day | RESPIRATORY_TRACT | Status: DC
  Administered 2022-11-27: 1 via RESPIRATORY_TRACT
  Filled 2022-11-27: qty 28

## 2022-11-27 MED ORDER — GABAPENTIN 300 MG PO CAPS: 300.0000 mg | ORAL_CAPSULE | Freq: Every day | ORAL | Status: DC

## 2022-11-27 MED ORDER — FLUOXETINE HCL 20 MG PO CAPS
20.0000 mg | ORAL_CAPSULE | Freq: Every day | ORAL | Status: DC
  Administered 2022-11-27: 20 mg via ORAL
  Filled 2022-11-27: qty 1

## 2022-11-27 MED ORDER — ALUM & MAG HYDROXIDE-SIMETH 200-200-20 MG/5ML PO SUSP: 30.0000 mL | ORAL | Status: DC | PRN

## 2022-11-27 MED ORDER — OLANZAPINE 5 MG PO TBDP: 5.0000 mg | ORAL_TABLET | Freq: Three times a day (TID) | ORAL | Status: DC | PRN

## 2022-11-27 MED ORDER — MAGNESIUM HYDROXIDE 400 MG/5ML PO SUSP
30.0000 mL | Freq: Every day | ORAL | Status: DC | PRN
  Administered 2022-11-30: 30 mL via ORAL
  Filled 2022-11-27: qty 30

## 2022-11-27 MED ORDER — ATORVASTATIN CALCIUM 40 MG PO TABS
40.0000 mg | ORAL_TABLET | Freq: Every day | ORAL | Status: DC
  Administered 2022-11-27: 40 mg via ORAL
  Filled 2022-11-27: qty 1

## 2022-11-27 MED ORDER — INSULIN ASPART 100 UNIT/ML IJ SOLN
0.0000 [IU] | Freq: Three times a day (TID) | INTRAMUSCULAR | Status: DC
  Administered 2022-11-28 (×2): 1 [IU] via SUBCUTANEOUS
  Administered 2022-11-29: 2 [IU] via SUBCUTANEOUS
  Administered 2022-11-30 – 2022-12-02 (×3): 1 [IU] via SUBCUTANEOUS
  Administered 2022-12-03: 3 [IU] via SUBCUTANEOUS
  Administered 2022-12-03 – 2022-12-05 (×2): 1 [IU] via SUBCUTANEOUS
  Administered 2022-12-06: 2 [IU] via SUBCUTANEOUS
  Filled 2022-11-27 (×10): qty 1

## 2022-11-27 NOTE — Progress Notes (Signed)
Pt was accepted to [facility] Unit TODAY 11/27/2022. Bed assignment: 31  Pt meets inpatient criteria per Merlyn Lot, NP  Attending Physician will be Caren Griffins, DO  Report can be called to: [phone]  Pt can arrive; [facility] to coordinate arrival time  Care Team Notified: Trisha Mangle, RN, Merlyn Lot, NP, and Gordan Payment, RN  [place], [place]  11/27/2022 2:19 PM

## 2022-11-27 NOTE — Tx Team (Signed)
Initial Treatment Plan 11/27/2022 5:56 PM [name] [id number]   PATIENT STRESSORS: Financial difficulties   Health problems   Substance abuse     PATIENT STRENGTHS: Communication skills  General fund of knowledge    PATIENT IDENTIFIED PROBLEMS: Decline in physical health  homelessness                   DISCHARGE CRITERIA:  Need for constant or close observation no longer present Reduction of life-threatening or endangering symptoms to within safe limits  PRELIMINARY DISCHARGE PLAN: Placement in alternative living arrangements  PATIENT/FAMILY INVOLVEMENT: This treatment plan has been presented to and reviewed with the patient, Michael [name].  The patient has been given the opportunity to ask questions and make suggestions.  Sunday Spillers, RN 11/27/2022, 5:56 PM

## 2022-11-27 NOTE — ED Notes (Signed)
Voluntary consent for treatment faxed to [place] swab done

## 2022-11-27 NOTE — ED Notes (Signed)
Pt transported to [facility]  via safe transport

## 2022-11-27 NOTE — ED Provider Notes (Signed)
Emergency Medicine Observation Re-evaluation Note  Michael [name] is a 72 y.o. male, seen on rounds today.  Pt initially presented to the ED for complaints of Suicidal and Addiction Problem Currently, the patient is asleep.  Physical Exam  BP (!) 152/89 (BP Location: Left Arm)   Pulse 77   Temp 98.6 F (37 C) (Oral)   Resp 18   Ht 5' 8"$  (1.727 m)   Wt 72.6 kg   SpO2 100%   BMI 24.33 kg/m  Physical Exam General: No acute distress Cardiac: Regular rate Lungs: No increased work of breathing Psych: Calm, sleep  ED Course / MDM  EKG:EKG Interpretation  Date/Time:  Tuesday November 26 2022 12:44:07 EST Ventricular Rate:  99 PR Interval:  53 QRS Duration: 84 QT Interval:  382 QTC Calculation: 391 R Axis:   79 Text Interpretation: Sinus or ectopic atrial rhythm Short PR interval Probable LVH with secondary repol abnrm No significant change since last tracing Confirmed by [name] [phone]) on 11/26/2022 3:26:52 PM  I have reviewed the labs performed to date as well as medications administered while in observation.  Recent changes in the last 24 hours include patient was medically cleared and evaluated by psychiatry and recommended for inpatient psych placement..  Plan  Current plan is for inpatient psych.    Leanord Asal K, DO 11/27/22 [phone]

## 2022-11-27 NOTE — Progress Notes (Signed)
Patient is alert and oriented x4. He is calm and cooperative. He endorses passive SI and says that he "sometimes have thoughts of harming people". He does not name a specific reason or person. He denies AVH. Patient says that his main stressor is his declining physical health. He also does not have a place to stay and has been going from motel to [employer]. Patient endorses using crack/cocaine, but denies alcohol, tobacco, and other drug use.   Patient ambulates with a front wheel walker. He is a standby assist and follows directions. Patient is oriented to call alarms in his room. Bed alarm is turned on while in bed.  Patient oriented to the unit. He eats dinner in the dayroom before returning to his room. Patient remains safe on the unit at this time.

## 2022-11-28 DIAGNOSIS — F332 Major depressive disorder, recurrent severe without psychotic features: Secondary | ICD-10-CM

## 2022-11-28 LAB — GLUCOSE, CAPILLARY
Glucose-Capillary: 112 mg/dL — ABNORMAL HIGH (ref 70–99)
Glucose-Capillary: 137 mg/dL — ABNORMAL HIGH (ref 70–99)
Glucose-Capillary: 146 mg/dL — ABNORMAL HIGH (ref 70–99)

## 2022-11-28 MED ORDER — TRAZODONE HCL 50 MG PO TABS
50.0000 mg | ORAL_TABLET | Freq: Every day | ORAL | Status: DC
  Administered 2022-11-28 – 2022-12-02 (×5): 50 mg via ORAL
  Filled 2022-11-28 (×5): qty 1

## 2022-11-28 MED ORDER — GABAPENTIN 100 MG PO CAPS
100.0000 mg | ORAL_CAPSULE | Freq: Three times a day (TID) | ORAL | Status: DC | PRN
  Administered 2022-11-28 – 2022-12-07 (×11): 100 mg via ORAL
  Filled 2022-11-28 (×11): qty 1

## 2022-11-28 MED ORDER — ADULT MULTIVITAMIN W/MINERALS CH
1.0000 | ORAL_TABLET | Freq: Every day | ORAL | Status: DC
  Administered 2022-11-28 – 2022-12-09 (×12): 1 via ORAL
  Filled 2022-11-28 (×12): qty 1

## 2022-11-28 MED ORDER — DULOXETINE HCL 20 MG PO CPEP
20.0000 mg | ORAL_CAPSULE | Freq: Every day | ORAL | Status: DC
  Administered 2022-11-28 – 2022-12-09 (×12): 20 mg via ORAL
  Filled 2022-11-28 (×12): qty 1

## 2022-11-28 NOTE — Group Note (Signed)
Recreation Therapy Group Note   Group Topic:Relaxation  Group Date: 11/28/2022 Start Time: 1400 End Time: 1450 Facilitators: Vilma Prader, LRT, CTRS Location:  Day Room  Group Description: PMR (Progressive Muscle Relaxation). LRT asks patients their current level of stress/anxiety from 1-10, with 10 being the highest. LRT educates patients on what PMR is and the benefits that come from it. Patients are asked to sit with their feet flat on the floor while sitting up and all the way back in their chair, if possible. LRT follows prompt that requires the patients to tense and release different muscles in their body and focus on their breathing. During session, lights are off and soft music is being played. At the end of the prompt, LRT asks patients to rank their current levels of stress/anxiety from 1-10, 10 being the highest.   Affect/Mood: Appropriate   Participation Level: Minimal   Participation Quality: Independent   Behavior: Appropriate   Speech/Thought Process: Coherent   Insight: Fair   Judgement: Good   Modes of Intervention: Activity and Education   Patient Response to Interventions:  Receptive   Education Outcome:  Acknowledges education   Clinical Observations/Individualized Feedback: Moshe was somewhat active in their participation of session activities and group discussion. Pt identified his anxiety level a 7 an stress a 7 before the session. After the session, he rated his anxiety a 7 and his stress a 7. Ammon shared that he had never done any sort of relaxation exercise but that he did not really enjoy it.    Plan: Continue to engage patient in RT group sessions 2-3x/week.   [address], LRT, [place] 11/28/2022 2:54 PM

## 2022-11-28 NOTE — H&P (Signed)
Psychiatric Admission Assessment Adult  Patient Identification: Juddson Mascarenas MRN:  [id number] Date of Evaluation:  11/28/2022 Chief Complaint:  MDD (major depressive disorder), recurrent episode, severe ([place]) [F33.2] Principal Diagnosis: Major depressive disorder, recurrent, severe without psychotic features ([place]) Diagnosis:  Principal Problem:   Major depressive disorder, recurrent, severe without psychotic features ([place]) Active Problems:   Cocaine use disorder ([place])   MDD (major depressive disorder), recurrent episode, severe ([place])  History of Present Illness: 72 yo male presented to the ED after using crack/cocaine with chest pain and suicidal ideations.  Long history of cocaine use.  On assessment, he rates his depression and anxiety at a 7/10 with 10 being the highest.  Passive suicidal ideations, no plan or intent.  He reports one past suicide attempt by cutting himself last year and one admission, no stitches or sutures needed.  Sleep is fair, multiple health issues.  Appetite without issues.  His biggest stressor is lack of housing and requests an apartment, referred to social work and let him know that this will most likely need to be worked out with [facility] that he has visited.   Per social work, [employer] on admission: Jonathon Conger. Juntunen is a 72 year-old widowed male who presents to [facility] ED via EMS. Patient reports a history of depression. Patient states he contacted EMS due to chest pain and being suicidal. He says "I don't like the way my life is, I have a lot of health problems." Patient denies having a plan. Additionally, patient endorses HI with no plan or identified person. Patient states he has been feeling this way a couple of months. Patient denies current auditory or visual hallucinations. Patient states he smokes crack cocaine and last smoked two days ago. Patient UDS is positive for cocaine. Patient denies any additional substance use.    Patient identifies his  lifestyle as his primary stressor. Patient reports he has been homeless for about two years and spends most of his time at hotels or in the hospital. Patient states he has numerous health issues. He reports kidney problems, 2 heart attacks and mild strokes. He also uses a cane or walker for mobility. Patient denies having any supports, due to being estranged from family. Patient denies any history of abuse.    Patient is not currently receiving any mental health or substance use services. Patient reports he was discharged from the hospital to [facility] in January, however they discharged him due to his health issues. Patient reports one previous psychiatric hospitalization a few years ago, due to a suicide attempt by cutting.  Associated Signs/Symptoms: Depression Symptoms:  depressed mood, anxiety, loss of energy/fatigue, disturbed sleep, (Hypo) Manic Symptoms:  NOne Anxiety Symptoms:  Excessive Worry, Psychotic Symptoms:  None PTSD Symptoms: NA Total Time spent with patient: 1 hour  Past Psychiatric History: depression, anxiety, cocaine abuse  Is the patient at risk to self? Yes.    Has the patient been a risk to self in the past 6 months? Yes.    Has the patient been a risk to self within the distant past? No.  Is the patient a risk to others? No.  Has the patient been a risk to others in the past 6 months? No.  Has the patient been a risk to others within the distant past? No.   [place] Scale:  [place] Admission (Current) from 11/27/2022 in [place] ED from 11/26/2022 in [facility] Emergency Department at [facility] ED from  11/16/2022 in [facility] Emergency Department at [place] Error: Q7 should not be populated when Q6 is No Low Risk Low Risk        Prior Inpatient Therapy: Yes.   If yes, describe hospitalization  Prior Outpatient Therapy: Yes.   If yes, describe [place]   Alcohol  Screening: 1. How often do you have a drink containing alcohol?: Never 2. How many drinks containing alcohol do you have on a typical day when you are drinking?: 1 or 2 3. How often do you have six or more drinks on one occasion?: Never AUDIT-C Score: 0 4. How often during the last year have you found that you were not able to stop drinking once you had started?: Never 5. How often during the last year have you failed to do what was normally expected from you because of drinking?: Never 6. How often during the last year have you needed a first drink in the morning to get yourself going after a heavy drinking session?: Never 7. How often during the last year have you had a feeling of guilt of remorse after drinking?: Never 8. How often during the last year have you been unable to remember what happened the night before because you had been drinking?: Never 9. Have you or someone else been injured as a result of your drinking?: No 10. Has a relative or friend or a doctor or another health worker been concerned about your drinking or suggested you cut down?: No Alcohol Use Disorder Identification Test Final Score (AUDIT): 0 Alcohol Brief Interventions/Follow-up: Alcohol education/Brief advice Substance Abuse History in the last 12 months:  Yes.   Consequences of Substance Abuse: NA Previous Psychotropic Medications: Yes  Psychological Evaluations: Yes  Past Medical History:  Past Medical History:  Diagnosis Date   A-fib ([place])    Anemia    Asthma    No PFTs, history of childhood asthma   CAD (coronary artery disease)    Cellulitis 04/2014   left facial   Cellulitis and abscess of toe of right foot 12/08/2019   Chondromalacia of medial femoral condyle    Left knee MRI 04/28/12: Chondromalacia of the medial femoral condyle with slight peripheral degeneration of the meniscocapsular junction of the medial meniscus; followed by sports medicine   Chronic kidney failure, stage 4 (severe) ([place])     Collagen vascular disease ([place])    Crack cocaine use    for 20+ years, has been enrolled in detox programs in the past   Depression    with history of hospitalization for suicidal ideation   Diabetes mellitus 2002   Diagnosed in 2002, started insulin in 2012   Gout    Headache(784.0)    CT head 08/2011: Periventricular and subcortical white matter hypodensities are most in keeping with chronic microangiopathic change   HIV infection ([place]) 08/2011   Followed by [name]   Hyperlipidemia    Hypertension    Pulmonary embolism [facility])     Past Surgical History:  Procedure Laterality Date   AMPUTATION Right 07/21/2019   Procedure: RIGHT SECOND TOE AMPUTATION;  Surgeon: [name], MD;  Location: [place];  Service: Orthopedics;  Laterality: Right;   BACK SURGERY     1988   BOWEL RESECTION     CARDIAC SURGERY     CERVICAL SPINE SURGERY     " rods in my neck "   CORONARY ARTERY BYPASS GRAFT     CORONARY STENT  PLACEMENT     NM MYOCAR PERF WALL MOTION  12/27/2011   normal   SPINE SURGERY     Family History:  Family History  Problem Relation Age of Onset   Diabetes Mother    Hypertension Mother    Hyperlipidemia Mother    Diabetes Father    Cancer Father    Hypertension Father    Diabetes Brother    Heart disease Brother    Diabetes Sister    Colon cancer Neg Hx    Family Psychiatric  History: none Tobacco Screening:  Social History   Tobacco Use  Smoking Status Never  Smokeless Tobacco Never    BH Tobacco Counseling     Are you interested in Tobacco Cessation Medications?  N/A, patient does not use tobacco products Counseled patient on smoking cessation:  N/A, patient does not use tobacco products Reason Tobacco Screening Not Completed: No value filed.       Social History:  Social History   Substance and Sexual Activity  Alcohol Use No   Alcohol/week: 4.0 standard drinks of alcohol   Types: 2 Cans of beer, 2 Shots of liquor per week     Social History    Substance and Sexual Activity  Drug Use Not Currently   Frequency: 4.0 times per week   Types: "Crack" cocaine, Cocaine   Comment: Recent use of crack    Additional Social History:  homeless     Allergies:  No Known Allergies Lab Results:  Results for orders placed or performed during the hospital encounter of 11/27/22 (from the past 48 hour(s))  Glucose, capillary     Status: None   Collection Time: 11/27/22  5:24 PM  Result Value Ref Range   Glucose-Capillary 99 70 - 99 mg/dL    Comment: Glucose reference range applies only to samples taken after fasting for at least 8 hours.  Glucose, capillary     Status: Abnormal   Collection Time: 11/27/22  8:31 PM  Result Value Ref Range   Glucose-Capillary 139 (H) 70 - 99 mg/dL    Comment: Glucose reference range applies only to samples taken after fasting for at least 8 hours.  Glucose, capillary     Status: Abnormal   Collection Time: 11/28/22  7:52 AM  Result Value Ref Range   Glucose-Capillary 112 (H) 70 - 99 mg/dL    Comment: Glucose reference range applies only to samples taken after fasting for at least 8 hours.    Blood Alcohol level:  Lab Results  Component Value Date   ETH <10 11/26/2022   ETH <10 [id number]    Metabolic Disorder Labs:  Lab Results  Component Value Date   HGBA1C 6.8 (H) 09/30/2022   MPG 148 09/30/2022   MPG 174.29 07/02/2022   No results found for: "PROLACTIN" Lab Results  Component Value Date   CHOL 149 04/25/2021   TRIG 166 (H) 04/25/2021   HDL 57 04/25/2021   CHOLHDL 2.6 04/25/2021   VLDL 27 12/09/2019   LDLCALC 68 04/25/2021   LDLCALC 120 (H) 12/09/2019    Current Medications: Current Facility-Administered Medications  Medication Dose Route Frequency Provider Last Rate Last Admin   acetaminophen (TYLENOL) tablet 650 mg  650 mg Oral Q6H PRN Merlyn Lot E, NP       albuterol (VENTOLIN HFA) 108 (90 Base) MCG/ACT inhaler 1-2 puff  1-2 puff Inhalation Q6H PRN Mallie Darting, NP        alum & mag hydroxide-simeth (MAALOX/MYLANTA) 200-200-20 MG/5ML  suspension 30 mL  30 mL Oral Q4H PRN Mallie Darting, NP       atorvastatin (LIPITOR) tablet 40 mg  40 mg Oral Daily Merlyn Lot E, NP   40 mg at 11/28/22 0853   diltiazem (CARDIZEM CD) 24 hr capsule 120 mg  120 mg Oral Daily Merlyn Lot E, NP   120 mg at 11/28/22 0854   DULoxetine (CYMBALTA) DR capsule 20 mg  20 mg Oral Daily Patrecia Pour, NP       fluticasone furoate-vilanterol (BREO ELLIPTA) 200-25 MCG/ACT 1 puff  1 puff Inhalation Daily Merlyn Lot E, NP   1 puff at 11/28/22 0900   gabapentin (NEURONTIN) capsule 100 mg  100 mg Oral TID PRN Patrecia Pour, NP       insulin aspart (novoLOG) injection 0-5 Units  0-5 Units Subcutaneous QHS Merlyn Lot E, NP       insulin aspart (novoLOG) injection 0-9 Units  0-9 Units Subcutaneous TID WC Merlyn Lot E, NP       isosorbide mononitrate (IMDUR) 24 hr tablet 15 mg  15 mg Oral Daily Merlyn Lot E, NP   15 mg at 11/28/22 0851   OLANZapine zydis (ZYPREXA) disintegrating tablet 5 mg  5 mg Oral Q8H PRN Mallie Darting, NP       And   LORazepam (ATIVAN) tablet 1 mg  1 mg Oral PRN Mallie Darting, NP       And   ziprasidone (GEODON) injection 20 mg  20 mg Intramuscular PRN Merlyn Lot E, NP       magnesium hydroxide (MILK OF MAGNESIA) suspension 30 mL  30 mL Oral Daily PRN Mallie Darting, NP       polyethylene glycol (MIRALAX / GLYCOLAX) packet 17 g  17 g Oral BID Virl Cagey E, RPH       traZODone (DESYREL) tablet 50 mg  50 mg Oral QHS Patrecia Pour, NP       PTA Medications: Medications Prior to Admission  Medication Sig Dispense Refill Last Dose   Accu-Chek Softclix Lancets lancets Use as directed up to four times daily 100 each 0    albuterol (VENTOLIN HFA) 108 (90 Base) MCG/ACT inhaler Inhale 1-2 puffs into the lungs every 6 (six) hours as needed for wheezing or shortness of breath. (Patient not taking: Reported on 11/14/2022) 6.7 g 0    atorvastatin (LIPITOR) 40  MG tablet Take 1 tablet (40 mg total) by mouth daily. (Patient not taking: Reported on 11/14/2022) 30 tablet 0    Blood Glucose Monitoring Suppl (ACCU-CHEK GUIDE) w/Device KIT Use as directed. 1 kit 0    budesonide-formoterol (SYMBICORT) 160-4.5 MCG/ACT inhaler Inhale 2 puffs into the lungs in the morning and at bedtime. (Patient not taking: Reported on 11/14/2022) 10.2 g 0    diltiazem (CARDIZEM CD) 120 MG 24 hr capsule Take 1 capsule (120 mg total) by mouth daily. (Patient not taking: Reported on 11/14/2022) 30 capsule 0    ferrous sulfate 325 (65 FE) MG tablet Take 1 tablet (325 mg total) by mouth daily with breakfast. (Patient not taking: Reported on 11/14/2022) 30 tablet 0    FLUoxetine (PROZAC) 20 MG capsule Take 1 capsule (20 mg total) by mouth daily. (Patient not taking: Reported on 11/14/2022) 30 capsule 0    gabapentin (NEURONTIN) 300 MG capsule Take 1 capsule (300 mg total) by mouth at bedtime. (Patient not taking: Reported on 11/14/2022) 30 capsule 0    glucose blood test  strip use as directed up to four times daily 100 each 0    hydrALAZINE (APRESOLINE) 50 MG tablet Take 1 tablet (50 mg total) by mouth every 8 (eight) hours. (Patient not taking: Reported on 11/14/2022) 90 tablet 0    Insulin Pen Needle 31G X 5 MM MISC Use with insulin pens (Patient taking differently: 1 each by Other route See admin instructions. Use with insulin pens) 100 each 1    Insulin Pen Needle 32G X 4 MM MISC Use as directed up to 4 times daily. 100 each 0    isosorbide mononitrate (IMDUR) 30 MG 24 hr tablet Take 0.5 tablets (15 mg total) by mouth daily. (Patient not taking: Reported on 11/14/2022) 15 tablet 0    meclizine (ANTIVERT) 25 MG tablet Take 1 tablet (25 mg total) by mouth 3 (three) times daily as needed for dizziness. (Patient not taking: Reported on 10/22/2022) 30 tablet 0    nitroGLYCERIN (NITROSTAT) 0.4 MG SL tablet Place 1 tablet (0.4 mg total) under the tongue every 5 (five) minutes as needed for chest pain. (Patient  not taking: Reported on 10/22/2022) 25 tablet 0    polyethylene glycol powder (GLYCOLAX/MIRALAX) 17 GM/SCOOP powder Take 17 g by mouth 2 (two) times daily. (Patient not taking: Reported on 10/22/2022) 238 g 0     Musculoskeletal: Strength & Muscle Tone: within normal limits Gait & Station: normal Patient leans: N/A  Psychiatric Specialty Exam: Physical Exam Vitals and nursing note reviewed.  Constitutional:      Appearance: Normal appearance.  HENT:     Head: Normocephalic.     Nose: Nose normal.  Pulmonary:     Effort: Pulmonary effort is normal.  Musculoskeletal:        General: Normal range of motion.     Cervical back: Normal range of motion.  Neurological:     General: No focal deficit present.     Mental Status: He is alert and oriented to person, place, and time.  Psychiatric:        Attention and Perception: Attention and perception normal.        Mood and Affect: Mood is anxious and depressed.        Speech: Speech normal.        Behavior: Behavior normal. Behavior is cooperative.        Thought Content: Thought content includes suicidal ideation.        Cognition and Memory: Cognition and memory normal.        Judgment: Judgment normal.     Review of Systems  Musculoskeletal:  Positive for back pain and joint pain.  Psychiatric/Behavioral:  Positive for depression, substance abuse and suicidal ideas. The patient is nervous/anxious.   All other systems reviewed and are negative.   Blood pressure (!) 153/88, pulse (!) 58, temperature 97.8 F (36.6 C), temperature source Oral, resp. rate 16, height 5' 8"$  (1.727 m), weight 67.2 kg, SpO2 100 %.Body mass index is 22.53 kg/m.  General Appearance: Casual  Eye Contact:  Good  Speech:  Normal Rate  Volume:  Normal  Mood:  Anxious and Depressed  Affect:  Congruent  Thought Process:  Coherent  Orientation:  Full (Time, Place, and Person)  Thought Content:  WDL and Logical  Suicidal Thoughts:  Yes.  without intent/plan   Homicidal Thoughts:  No  Memory:  Immediate;   Good Recent;   Good Remote;   Good  Judgement:  Fair  Insight:  Fair  Psychomotor Activity:  Normal  Concentration:  Concentration: Fair and Attention Span: Fair  Recall:  Good  Fund of Knowledge:  Good  Language:  Good  Akathisia:  No  Handed:  Right  AIMS (if indicated):     Assets:  Leisure Time Resilience Social Support  ADL's:  Intact  Cognition:  WNL  Sleep:         Physical Exam: Physical Exam Vitals and nursing note reviewed.  Constitutional:      Appearance: Normal appearance.  HENT:     Head: Normocephalic.     Nose: Nose normal.  Pulmonary:     Effort: Pulmonary effort is normal.  Musculoskeletal:        General: Normal range of motion.     Cervical back: Normal range of motion.  Neurological:     General: No focal deficit present.     Mental Status: He is alert and oriented to person, place, and time.  Psychiatric:        Attention and Perception: Attention and perception normal.        Mood and Affect: Mood is anxious and depressed.        Speech: Speech normal.        Behavior: Behavior normal. Behavior is cooperative.        Thought Content: Thought content includes suicidal ideation.        Cognition and Memory: Cognition and memory normal.        Judgment: Judgment normal.    Review of Systems  Musculoskeletal:  Positive for back pain and joint pain.  Psychiatric/Behavioral:  Positive for depression, substance abuse and suicidal ideas. The patient is nervous/anxious.   All other systems reviewed and are negative.  Blood pressure (!) 153/88, pulse (!) 58, temperature 97.8 F (36.6 C), temperature source Oral, resp. rate 16, height 5' 8"$  (1.727 m), weight 67.2 kg, SpO2 100 %. Body mass index is 22.53 kg/m.  Treatment Plan Summary: Daily contact with patient to assess and evaluate symptoms and progress in treatment, Medication management, and Plan : Major depressive disorder, recurrent, severe  without psychosis: Started Cymbalta 20 mg daily  Anxiety: Started gabapentin 100 mg TID PRN  Insomnia: Trazodone 50 mg at bedtime  Observation Level/Precautions:  15 minute checks  Laboratory:  Completed in the ED, stable  Psychotherapy:  Individual and group therapy  Medications:    Consultations:    Discharge Concerns:    Estimated LOS:  Other:     Physician Treatment Plan for Primary Diagnosis: Major depressive disorder, recurrent, severe without psychotic features ([place]) Long Term Goal(s): Improvement in symptoms so as ready for discharge  Short Term Goals: Ability to identify changes in lifestyle to reduce recurrence of condition will improve, Ability to verbalize feelings will improve, Ability to disclose and discuss suicidal ideas, Ability to demonstrate self-control will improve, Ability to identify and develop effective coping behaviors will improve, Ability to maintain clinical measurements within normal limits will improve, Compliance with prescribed medications will improve, and Ability to identify triggers associated with substance abuse/mental health issues will improve  Physician Treatment Plan for Secondary Diagnosis: Principal Problem:   Major depressive disorder, recurrent, severe without psychotic features ([place]) Active Problems:   Cocaine use disorder ([employer])   MDD (major depressive disorder), recurrent episode, severe ([place])  Long Term Goal(s): Improvement in symptoms so as ready for discharge  Short Term Goals: Ability to identify changes in lifestyle to reduce recurrence of condition will improve, Ability to verbalize feelings will improve, Ability to disclose and discuss  suicidal ideas, Ability to demonstrate self-control will improve, Ability to identify and develop effective coping behaviors will improve, Ability to maintain clinical measurements within normal limits will improve, Compliance with prescribed medications will improve, and Ability to identify triggers  associated with substance abuse/mental health issues will improve  I certify that inpatient services furnished can reasonably be expected to improve the patient's condition.    Waylan Boga, NP 2/22/202410:49 AM

## 2022-11-28 NOTE — Group Note (Signed)
Date:  11/28/2022 Time:  4:00 AM  Group Topic/Focus:  Building Self Esteem:   The Focus of this group is helping patients become aware of the effects of self-esteem on their lives, the things they and others do that enhance or undermine their self-esteem, seeing the relationship between their level of self-esteem and the choices they make and learning ways to enhance self-esteem.    Participation Level:  Active  Participation Quality:  Attentive  Affect:  Excited  Cognitive:  Alert  Insight: Good  Engagement in Group:  Engaged  Modes of Intervention:  Discussion  Additional Comments:    Bradd Canary 11/28/2022, 4:00 AM

## 2022-11-28 NOTE — Progress Notes (Signed)
Patient is alert and oriented times 4. Mood and affect appropriate. Patient denies pain. He denies HI and AVH. Expresses SI but does verbally contract for safety. Expresses having feelings of anxiety and depression at this time. States he slept good last night. Morning meds given whole by mouth W/O difficulty. Ate breakfast in day room- appetite good. Patient remains on unit with Q15 minute checks in place.

## 2022-11-28 NOTE — Progress Notes (Signed)
NUTRITION ASSESSMENT  Pt identified as at risk on the Malnutrition Screen Tool  INTERVENTION:  -MVI with minerals daily -Double protein portions with meals -Magic cup TID with meals, each supplement provides 290 kcal and 9 grams of protein   NUTRITION DIAGNOSIS: Unintentional weight loss related to sub-optimal intake as evidenced by pt report.   Goal: Pt to meet >/= 90% of their estimated nutrition needs.  Monitor:  PO intake  Assessment:   Pt presented after using crack/cocaine with chest pain and suicidal ideations.   Pt admitted with MDD and cocaine use.   Per H&P, pt with good appetite and his main concern is that he is homeless and requesting an apartment. Per RN notes, pt is taking medications without difficulty and appetite is good. He ate breakfast in the day room.  Reviewed wt hx; pt has experienced a 11.6% wt loss over the past month, which is significant for time frame.   Medications reviewed and include cardizem and miralax.   Lab Results  Component Value Date   HGBA1C 6.8 (H) 09/30/2022   PTA DM medications are none.   Labs reviewed: CBGS: 99-123 (inpatient orders for glycemic control are 0-5 units insulin aspart daily at bedtime and 0-9 units insulin aspart TID with meals).    72 y.o. male  Height: Ht Readings from Last 1 Encounters:  11/27/22 5' 8"$  (1.727 m)    Weight: Wt Readings from Last 1 Encounters:  11/27/22 67.2 kg    Weight Hx: Wt Readings from Last 10 Encounters:  11/27/22 67.2 kg  11/26/22 72.6 kg  11/16/22 73.9 kg  11/14/22 75 kg  11/12/22 75 kg  11/10/22 74.4 kg  10/21/22 76 kg  10/07/22 76.5 kg  09/06/22 69.6 kg  08/10/22 72.6 kg    BMI:  Body mass index is 22.53 kg/m. BMI WDL.   Estimated Nutritional Needs: Kcal: 25-30 kcal/kg Protein: > 1 gram protein/kg Fluid: 1 ml/kcal  Diet Order:  Diet Order             Diet Carb Modified Fluid consistency: Thin; Room service appropriate? Yes  Diet effective now                   Pt is also offered choice of unit snacks mid-morning and mid-afternoon.  Pt is eating as desired.   Lab results and medications reviewed.   Loistine Chance, RD, LDN, [place] Registered Dietitian II Certified Diabetes Care and Education Specialist Please refer to [facility] for RD and/or RD on-call/weekend/after hours pager

## 2022-11-28 NOTE — Group Note (Deleted)
Date:  11/28/2022 Time:  3:55 AM  Group Topic/Focus:  Coping With Mental Health Crisis:   The purpose of this group is to help patients identify strategies for coping with mental health crisis.  Group discusses possible causes of crisis and ways to manage them effectively. Goals Group:   The focus of this group is to help patients establish daily goals to achieve during treatment and discuss how the patient can incorporate goal setting into their daily lives to aide in recovery.    Participation Level:  Active  Participation Quality:  Appropriate  Affect:  Appropriate  Cognitive:  Appropriate  Insight: Appropriate  Engagement in Group:  Engaged  Modes of Intervention:  Activity and Discussion  Additional Comments:    Michael [name] 11/28/2022, 3:55 AM

## 2022-11-28 NOTE — Group Note (Signed)
[facility] LCSW Group Therapy Note   Group Date: 11/28/2022 Start Time: [id number] End Time: 1605   Type of Therapy/Topic:  Group Therapy:  Balance in Life  Participation Level:  Active   Description of Group:    This group will address the concept of balance and how it feels and looks when one is unbalanced. Patients will be encouraged to process areas in their lives that are out of balance, and identify reasons for remaining unbalanced. Facilitators will guide patients utilizing problem- solving interventions to address and correct the stressor making their life unbalanced. Understanding and applying boundaries will be explored and addressed for obtaining  and maintaining a balanced life. Patients will be encouraged to explore ways to assertively make their unbalanced needs known to significant others in their lives, using other group members and facilitator for support and feedback.  Therapeutic Goals: Patient will identify two or more emotions or situations they have that consume much of in their lives. Patient will identify signs/triggers that life has become out of balance:  Patient will identify two ways to set boundaries in order to achieve balance in their lives:  Patient will demonstrate ability to communicate their needs through discussion and/or role plays  Summary of Patient Progress:    Patient was present for the entirety of the group session. Patient was an active listener and participated in the topic of discussion and added nuance to topic of conversation. CSW provided gratitude activity assignment to assist patient in development of identifying goals for a more balanced life. He stated that his goal was to "have a better life." He said that "my life used to be beautiful" and "I want to go back to that."      Therapeutic Modalities:   Cognitive Behavioral Therapy Solution-Focused Therapy Assertiveness Training   [place], LCSWA

## 2022-11-28 NOTE — Plan of Care (Signed)
  Problem: Education: Goal: Knowledge of General Education information will improve Description: Including pain rating scale, medication(s)/side effects and non-pharmacologic comfort measures Outcome: Progressing   Problem: Health Behavior/Discharge Planning: Goal: Ability to manage health-related needs will improve Outcome: Progressing   Problem: Clinical Measurements: Goal: Ability to maintain clinical measurements within normal limits will improve Outcome: Progressing Goal: Will remain free from infection Outcome: Progressing Goal: Diagnostic test results will improve Outcome: Progressing Goal: Respiratory complications will improve Outcome: Progressing Goal: Cardiovascular complication will be avoided Outcome: Progressing   Problem: Activity: Goal: Risk for activity intolerance will decrease Outcome: Progressing   Problem: Nutrition: Goal: Adequate nutrition will be maintained Outcome: Progressing   Problem: Coping: Goal: Level of anxiety will decrease Outcome: Progressing   Problem: Elimination: Goal: Will not experience complications related to bowel motility Outcome: Progressing Goal: Will not experience complications related to urinary retention Outcome: Progressing   Problem: Pain Managment: Goal: General experience of comfort will improve Outcome: Progressing   Problem: Safety: Goal: Ability to remain free from injury will improve Outcome: Progressing   Problem: Skin Integrity: Goal: Risk for impaired skin integrity will decrease Outcome: Progressing   Problem: Education: Goal: Knowledge of [place] [facility] Education information/materials will improve Outcome: Progressing Goal: Emotional status will improve Outcome: Progressing Goal: Mental status will improve Outcome: Progressing Goal: Verbalization of understanding the information provided will improve Outcome: Progressing   Problem: Coping: Goal: Ability to verbalize frustrations and anger  appropriately will improve Outcome: Progressing Goal: Ability to demonstrate self-control will improve Outcome: Progressing   Problem: Physical Regulation: Goal: Ability to maintain clinical measurements within normal limits will improve Outcome: Progressing   Problem: Self-Concept: Goal: Ability to disclose and discuss suicidal ideas will improve Outcome: Progressing Goal: Will verbalize positive feelings about self Outcome: Progressing

## 2022-11-28 NOTE — Progress Notes (Signed)
   11/28/22 2144  Vital Signs  Temp 98.5 F (36.9 C)  Temp Source Oral  Pulse Rate 70  Pulse Rate Source Monitor  Resp 16  BP (!) 148/66  BP Location Right Arm  BP Method Automatic  Patient Position (if appropriate) Lying  Oxygen Therapy  SpO2 100 %   BP now resembles his baseline. Pt says that pain has eased up some. Will continue to monitor.

## 2022-11-28 NOTE — Progress Notes (Signed)
   11/28/22 2004  Vital Signs  Temp 98.5 F (36.9 C)  Temp Source Oral  Pulse Rate (!) 48  Pulse Rate Source Monitor  BP (!) 180/68  BP Location Right Arm  BP Method Automatic  Patient Position (if appropriate) Sitting  Oxygen Therapy  SpO2 100 %   Pt has HTN. Pt had just walked up the hallway to dayroom. Pt in NAD. Will give PRNs for pain and anxiety and recheck BP.

## 2022-11-28 NOTE — Progress Notes (Signed)
   11/27/22 2200  Psych Admission Type (Psych Patients Only)  Admission Status Voluntary  Psychosocial Assessment  Patient Complaints Anxiety;Depression  Eye Contact Fair  Facial Expression Flat  Affect Flat  Speech Soft  Interaction Minimal  Motor Activity Slow;Unsteady  Appearance/Hygiene In scrubs  Behavior Characteristics Cooperative  Mood Depressed;Anxious  Aggressive Behavior  Effect No apparent injury  Thought Process  Coherency WDL  Content WDL  Delusions None reported or observed  Perception WDL  Hallucination None reported or observed  Judgment Limited  Confusion WDL  Danger to Self  Current suicidal ideation? Denies  Self-Injurious Behavior No self-injurious ideation or behavior indicators observed or expressed   Agreement Not to Harm Self Yes  Description of Agreement Verbal  Danger to Others  Danger to Others None reported or observed

## 2022-11-28 NOTE — BH Assessment (Signed)
Recreation Therapy Notes  INPATIENT RECREATION THERAPY ASSESSMENT  Patient Details Name: Stevey Demary MRN: [id number] DOB: 09-24-1951 Today's Date: 11/28/2022       Information Obtained From: Patient (In addition to chart review)  Able to Participate in Assessment/Interview: Yes  Patient Presentation: Responsive, Alert, Oriented (Soft-spoken)  Reason for Admission (Per Patient): Suicidal Ideation, Med Non-Compliance ("I've had a whole lot of health problems. I have had asthma since birth and they did not expect me to live. My whole life I have had health problems.")  Patient Stressors: Other (Comment) (Per chart review: health and homlessness. Per pt, "Pain. I have sharp pain in my neck and back all the time".)  Coping Skills:   Avoidance, Substance Abuse ("Smoking crack makes me forget things for a while but then it all comes back. It's like a cycle.")  Leisure Interests (2+):  [place] ("I like to window shop and see what all is out there".)  Frequency of Recreation/Participation: Weekly ("Twice a week")  Awareness of Community Resources:  Yes  Community Resources:  Mall  Current Use: No  If no, Barriers?: Financial  Expressed Interest in [employer] Information: No  County of Residence:  [place]  Patient Main Form of Transportation: Diplomatic Services operational officer [employer]")  Patient Strengths:  "I try to stay as calm as I can. I am good at reading".  Patient Identified Areas of Improvement:  "Get my health and strength back".  Patient Goal for Hospitalization:  "I am not good at taking my meds. When I take them all at once, they hurt mt stomach".  Current SI (including self-harm):  Yes ("It crosses my mind every now and then".)  Current HI:  Yes ("It crosses my mind every now and then".)  Current AVH: No  Staff Intervention Plan: Group Attendance, Collaborate with Interdisciplinary Treatment  Team  Consent to Intern Participation: N/A   Of Note:  Pt shared that he was on "oxy" for 4 years after getting injured at work. He said he was taking it 3 times a day and came off of it and switched to crack cocaine.   In the past, pt shared that he has seen a tall man walking around but then the man just disappears.   At the end of session, LRT asked if pt had any questions and pt asked: "Does it get better?" LRT provided therapeutic listening and encouragement to pt.     Miyana Mordecai E Charidy Cappelletti LRT, CTRS  11/28/2022, 1:04 PM

## 2022-11-28 NOTE — BHH Suicide Risk Assessment (Signed)
[facility] INPATIENT:  Family/Significant Other Suicide Prevention Education  Suicide Prevention Education:  Contact Attempts: Cadel Overman II, son, [phone] has been identified by the patient as the family member/significant other with whom the patient will be residing, and identified as the person(s) who will aid the patient in the event of a mental health crisis.  With written consent from the patient, two attempts were made to provide suicide prevention education, prior to and/or following the patient's discharge.  We were unsuccessful in providing suicide prevention education.  A suicide education pamphlet was given to the patient to share with family/significant other.  Date and time of first attempt:11/28/22/1:55 PM Date and time of second attempt: 2nd attempt needed  Herschell Virani [name] 11/28/2022, 1:49 PM

## 2022-11-28 NOTE — BHH Suicide Risk Assessment (Cosign Needed Addendum)
Suicide Risk Assessment  BHH Admission Suicide Risk Assessment   Nursing information obtained from:  Patient Demographic factors:  Male, Age 72 or older, Unemployed, Living alone Current Mental Status:  Suicidal ideation indicated by patient Loss Factors:  Decline in physical health Historical Factors:  Impulsivity Risk Reduction Factors:  NA  Total Time spent with patient: 1 hour Principal Problem: Major depressive disorder, recurrent, severe without psychotic features ([place]) Diagnosis:  Principal Problem:   Major depressive disorder, recurrent, severe without psychotic features ([place]) Active Problems:   Cocaine use disorder ([place])   MDD (major depressive disorder), recurrent episode, severe ([place])  Subjective Data: "I'm hanging in there."  Client admitted with depression and passive suicidal ideations along with cocaine/crack use.    Continued Clinical Symptoms:  Alcohol Use Disorder Identification Test Final Score (AUDIT): 0 The "Alcohol Use Disorders Identification Test", Guidelines for Use in Primary Care, Second Edition.  World Pharmacologist [facility]). Score between 0-7:  no or low risk or alcohol related problems. Score between 8-15:  moderate risk of alcohol related problems. Score between 16-19:  high risk of alcohol related problems. Score 20 or above:  warrants further diagnostic evaluation for alcohol dependence and treatment.   CLINICAL FACTORS:   Depression   Musculoskeletal: Strength & Muscle Tone: within normal limits Gait & Station: normal Patient leans: N/A  Psychiatric Specialty Exam: Physical Exam Vitals and nursing note reviewed.  Constitutional:      Appearance: Normal appearance.  HENT:     Head: Normocephalic.     Nose: Nose normal.  Pulmonary:     Effort: Pulmonary effort is normal.  Musculoskeletal:        General: Normal range of motion.     Cervical back: Normal range of motion.  Neurological:     General: No focal deficit present.      Mental Status: He is alert and oriented to person, place, and time.  Psychiatric:        Attention and Perception: Attention and perception normal.        Mood and Affect: Mood is anxious and depressed.        Speech: Speech normal.        Behavior: Behavior normal. Behavior is cooperative.        Thought Content: Thought content includes suicidal ideation.        Cognition and Memory: Cognition and memory normal.        Judgment: Judgment normal.     Review of Systems  Musculoskeletal:  Positive for back pain and joint pain.  Psychiatric/Behavioral:  Positive for depression, substance abuse and suicidal ideas. The patient is nervous/anxious.   All other systems reviewed and are negative.   Blood pressure (!) 153/88, pulse (!) 58, temperature 97.8 F (36.6 C), temperature source Oral, resp. rate 16, height 5' 8"$  (1.727 m), weight 67.2 kg, SpO2 100 %.Body mass index is 22.53 kg/m.  General Appearance: Casual  Eye Contact:  Good  Speech:  Normal Rate  Volume:  Normal  Mood:  Anxious and Depressed  Affect:  Congruent  Thought Process:  Coherent  Orientation:  Full (Time, Place, and Person)  Thought Content:  WDL and Logical  Suicidal Thoughts:  Yes.  without intent/plan  Homicidal Thoughts:  No  Memory:  Immediate;   Good Recent;   Good Remote;   Good  Judgement:  Fair  Insight:  Fair  Psychomotor Activity:  Normal  Concentration:  Concentration: Fair and Attention Span: Fair  Recall:  Good  Fund of Knowledge:  Good  Language:  Good  Akathisia:  No  Handed:  Right  AIMS (if indicated):     Assets:  Leisure Time Resilience Social Support  ADL's:  Intact  Cognition:  WNL  Sleep:         Physical Exam: Physical Exam Vitals and nursing note reviewed.  Constitutional:      Appearance: Normal appearance.  HENT:     Head: Normocephalic.     Nose: Nose normal.  Pulmonary:     Effort: Pulmonary effort is normal.  Musculoskeletal:        General: Normal range of  motion.     Cervical back: Normal range of motion.  Neurological:     General: No focal deficit present.     Mental Status: He is alert and oriented to person, place, and time.  Psychiatric:        Attention and Perception: Attention and perception normal.        Mood and Affect: Mood is anxious and depressed.        Speech: Speech normal.        Behavior: Behavior normal. Behavior is cooperative.        Thought Content: Thought content includes suicidal ideation.        Cognition and Memory: Cognition and memory normal.        Judgment: Judgment normal.    Review of Systems  Musculoskeletal:  Positive for back pain and joint pain.  Psychiatric/Behavioral:  Positive for depression, substance abuse and suicidal ideas. The patient is nervous/anxious.   All other systems reviewed and are negative.  Blood pressure (!) 153/88, pulse (!) 58, temperature 97.8 F (36.6 C), temperature source Oral, resp. rate 16, height 5' 8"$  (1.727 m), weight 67.2 kg, SpO2 100 %. Body mass index is 22.53 kg/m.   COGNITIVE FEATURES THAT CONTRIBUTE TO RISK:  None    SUICIDE RISK:   Mild:  Suicidal ideation of limited frequency, intensity, duration, and specificity.  There are no identifiable plans, no associated intent, mild dysphoria and related symptoms, good self-control (both objective and subjective assessment), few other risk factors, and identifiable protective factors, including available and accessible social support.  PLAN OF CARE:  Major depressive disorder, recurrent, severe without psychosis: Started Cymbalta 20 mg daily  Anxiety: Started gabapentin 100 mg TID PRN  Insomnia: Trazodone 50 mg at bedtime  I certify that inpatient services furnished can reasonably be expected to improve the patient's condition.   Waylan Boga, NP 11/28/2022, 10:06 AM

## 2022-11-28 NOTE — Progress Notes (Signed)
   11/28/22 2000  Psych Admission Type (Psych Patients Only)  Admission Status Voluntary  Psychosocial Assessment  Patient Complaints Anxiety;Depression  Eye Contact Fair  Facial Expression Anxious  Affect Anxious  Speech Soft;Logical/coherent  Interaction Minimal  Motor Activity Slow (uses walker-walks with care)  Appearance/Hygiene In scrubs  Behavior Characteristics Cooperative;Anxious  Mood Anxious;Depressed  Thought Process  Coherency WDL  Content WDL  Delusions None reported or observed  Perception WDL  Hallucination None reported or observed  Judgment Limited  Confusion None  Danger to Self  Current suicidal ideation? Passive  Agreement Not to Harm Self Yes  Description of Agreement verbal  Danger to Others  Danger to Others None reported or observed   Progress note   D: Pt seen in his room in the bed. Pt denies HI, AVH. Endorses passive SI without a plan. Pt contracts for safety. Pt rates pain  7/10 as chronic left side pain including headache. Says it has been this way since his surgery. Pt rates anxiety  6/10 and depression  6/10. Pt has extensive cardiac history. Also diabetic (Type 2). No other concerns noted at this time.  A: Pt provided support and encouragement. Pt given scheduled medication as prescribed. PRNs as appropriate. Q15 min checks for safety.   R: Pt safe on the unit. Will continue to monitor.

## 2022-11-28 NOTE — BHH Counselor (Signed)
Adult Comprehensive Assessment  Patient ID: Michael [name], male   DOB: 02/05/1951, 72 y.o.   MRN: [id number]  Information Source: Information source: Patient  Current Stressors:  Patient states their primary concerns and needs for treatment are:: "Suicidal thoughts, my health" Patient states their goals for this hospitilization and ongoing recovery are:: "Control my thoughts and make better decisions" Educational / Learning stressors: Pt denies Employment / Job issues: Pt denies Family Relationships: "Yes because I don't have a good connection with familyPublishing copy / Lack of resources (include bankruptcy): Yes Housing / Lack of housing: "Currently homeless for last 4 years" Physical health (include injuries & life threatening diseases): Kidney problems Social relationships: No I don't have no one Substance abuse: crack, smoke about twice a month, $2000 Bereavement / Loss: 2 years my wife passed away  Living/Environment/Situation:  Living Arrangements: Alone Living conditions (as described by patient or guardian): Pt is currently homeless How long has patient lived in current situation?: "about 4 years" What is atmosphere in current home: Dangerous, Chaotic  Family History:  Marital status: Widowed Widowed, when?: 2022 Are you sexually active?: No What is your sexual orientation?: Heterosexual Has your sexual activity been affected by drugs, alcohol, medication, or emotional stress?: Pt unable to assess Does patient have children?: Yes How many children?: 2 (Son and daughter) How is patient's relationship with their children?: Pt states that he does not really have a relationship with his children  Childhood History:  By whom was/is the patient raised?: Both parents Description of patient's relationship with caregiver when they were a child: "It was great" Patient's description of current relationship with people who raised him/her: Deceased How were you disciplined when  you got in trouble as a child/adolescent?: "get a whoopin" Does patient have siblings?: Yes Number of Siblings: 76 (4 boys, 2 girls) Description of patient's current relationship with siblings: "We talk but relationships, really not close" Did patient suffer any verbal/emotional/physical/sexual abuse as a child?: No Did patient suffer from severe childhood neglect?: No Has patient ever been sexually abused/assaulted/raped as an adolescent or adult?: No Was the patient ever a victim of a crime or a disaster?: No Witnessed domestic violence?: No Has patient been affected by domestic violence as an adult?: No  Education:  Highest grade of school patient has completed: 12th grade Currently a student?: No Learning disability?: No  Employment/Work Situation:   Employment Situation: On disability Why is Patient on Disability: injured on the job How Long has Patient Been on Disability: Pt states that he was on SSDI around 37 and then received SS when he was in his 7 Patient's Job has Been Impacted by Current Illness: No What is the Longest Time Patient has Held a Job?: 25 years 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 Has Patient ever Been in the [employer]?: No  Financial Resources:   Museum/gallery curator resources: [employer], Medicare, Medicaid Does patient have a Programmer, applications or guardian?: No  Alcohol/Substance Abuse:   What has been your use of drugs/alcohol within the last 12 months?: Used crack cocaine about 3 days ago and states he uses about twice a mongth If attempted suicide, did drugs/alcohol play a role in this?: No Alcohol/Substance Abuse Treatment Hx: Past Tx, Inpatient If yes, describe treatment: "many years ago in [place] or [facility]" Has alcohol/substance abuse ever caused legal problems?: No  Social Support System:   [employer] Support System: None Type of faith/religion: Methodist How does patient's faith help to  cope  with current illness?: "I don't know"  Leisure/Recreation:   Do You Have Hobbies?: No  Strengths/Needs:   What is the patient's perception of their strengths?: "been dealing with things a long time" Patient states they can use these personal strengths during their treatment to contribute to their recovery: "focusing on myself" Patient states these barriers may affect/interfere with their treatment: Pt states he is concerned his physical health my limit his treatment and states he "can't remember like he used to" Patient states these barriers may affect their return to the community: Pt denies  Discharge Plan:   Currently receiving community mental health services: Yes (From Whom) (medication managment and therapy) Patient states concerns and preferences for aftercare planning are: "I would like to find some living quarters" Patient states they will know when they are safe and ready for discharge when: "when I can control my thoughts" Does patient have access to transportation?: No Does patient have financial barriers related to discharge medications?: No Plan for no access to transportation at discharge: CSW will assist pt with obtaining transportation Plan for living situation after discharge: CSW will assist pt with housing resources Will patient be returning to same living situation after discharge?: No  Summary/Recommendations:   Summary and Recommendations (to be completed by the evaluator): Patient is a 72 year old male in [place] [facility] [place] [place] [facility] He reports that he receives SSI, Medicare and Medicaid. He presents to the hospital following suicidal ideation and substance use. He has a primary diagnosis of Major depressive disorder, recurrent, severe without psychotic features. Recent stressors include chronic unstable housing the last 4 years, no familial support, hx of cocaine use. Patient has no aftercare plan in place and is interested in referral to inpatient  substance use treatment, and outpatient medication management/therapy resources.  Recommendations include: crisis stabilization, therapeutic milieu, encourage group attendance and participation, medication management for detox/mood stabilization and development of comprehensive mental wellness/sobriety plan.  Sanav Remer [name]. 11/28/2022

## 2022-11-29 DIAGNOSIS — F332 Major depressive disorder, recurrent severe without psychotic features: Secondary | ICD-10-CM | POA: Diagnosis not present

## 2022-11-29 LAB — GLUCOSE, CAPILLARY
Glucose-Capillary: 120 mg/dL — ABNORMAL HIGH (ref 70–99)
Glucose-Capillary: 164 mg/dL — ABNORMAL HIGH (ref 70–99)
Glucose-Capillary: 115 mg/dL — ABNORMAL HIGH (ref 70–99)
Glucose-Capillary: 107 mg/dL — ABNORMAL HIGH (ref 70–99)

## 2022-11-29 NOTE — BHH Counselor (Signed)
CSW contacted the following facilities regarding openings in attempt to assist pt with inpatient rehab referrals.   [facility]): Phone: [phone] There facility is not currently open.  [facility]. Phone: [phone] There are no current openings but staff stated would call back in the next couple of days if residency spot opens up.   CSW will provide updates to pt regarding referrals and update discharge plan as needed.   Lacresha Fusilier [place], MSW, LCSW-A 2/23/20242:33 PM

## 2022-11-29 NOTE — Plan of Care (Signed)
Pt denies anxiety/depression at this time. Pt denies SI/HI/AVH or pain at this time. Pt is calm and cooperative. Pt is medication compliant. Pt provided with support and encouragement. Pt monitored q15 minutes for safety per unit policy. Plan of care ongoing.   Problem: Education: Goal: Knowledge of General Education information will improve Description: Including pain rating scale, medication(s)/side effects and non-pharmacologic comfort measures Outcome: Progressing   Problem: Coping: Goal: Level of anxiety will decrease Outcome: Progressing

## 2022-11-29 NOTE — BHH Suicide Risk Assessment (Signed)
[place] INPATIENT:  Family/Significant Other Suicide Prevention Education  Suicide Prevention Education:  Education Completed;  Michael [name], son, [phone] , has been identified by the patient as the family member/significant other with whom the patient will be residing, and identified as the person(s) who will aid the patient in the event of a mental health crisis (suicidal ideations/suicide attempt).  With written consent from the patient, the family member/significant other has been provided the following suicide prevention education, prior to the and/or following the discharge of the patient.  He stated he has not been in touch with his father in some time but stated that he does feel concerned about his memory issues and pt staying on top of taking his medications.   The suicide prevention education provided includes the following: Suicide risk factors Suicide prevention and interventions National Suicide Hotline telephone number [facility] assessment telephone number [facility] Emergency Assistance [place] and/or Residential Mobile Crisis Unit telephone number  Request made of family/significant other to: Remove weapons (e.g., guns, rifles, knives), all items previously/currently identified as safety concern.   Remove drugs/medications (over-the-counter, prescriptions, illicit drugs), all items previously/currently identified as a safety concern.  The family member/significant other verbalizes understanding of the suicide prevention education information provided.  The family member/significant other agrees to remove the items of safety concern listed above.  Michael [name] 11/29/2022, 11:33 AM

## 2022-11-29 NOTE — BHH Counselor (Signed)
CSW met with pt to assist with discharge planning needs.   CSW provided pt with a list of housing resources for pt to contact regarding potential placement.   CSW provided pt with information regarding inpatient rehabilitation at [facility] and [facility].   CSW will contact facilities regarding current openings and continue to progress towards pt's appropriate discharge plan.   Brelee Renk [place], MSW, LCSW-A 2/23/202412:07 PM

## 2022-11-29 NOTE — BH IP Treatment Plan (Signed)
Interdisciplinary Treatment and Diagnostic Plan Update  11/29/2022 Time of Session: 9:30AM Kyro Thoroughman MRN: [id number]  Principal Diagnosis: Major depressive disorder, recurrent, severe without psychotic features ([place])  Secondary Diagnoses: Principal Problem:   Major depressive disorder, recurrent, severe without psychotic features ([place]) Active Problems:   Cocaine use disorder ([place])   MDD (major depressive disorder), recurrent episode, severe ([place])   Current Medications:  Current Facility-Administered Medications  Medication Dose Route Frequency Provider Last Rate Last Admin   acetaminophen (TYLENOL) tablet 650 mg  650 mg Oral Q6H PRN Mallie Darting, NP   650 mg at 11/29/22 0909   albuterol (VENTOLIN HFA) 108 (90 Base) MCG/ACT inhaler 1-2 puff  1-2 puff Inhalation Q6H PRN Mallie Darting, NP       alum & mag hydroxide-simeth (MAALOX/MYLANTA) 200-200-20 MG/5ML suspension 30 mL  30 mL Oral Q4H PRN Mallie Darting, NP       atorvastatin (LIPITOR) tablet 40 mg  40 mg Oral Daily Merlyn Lot E, NP   40 mg at 11/29/22 0910   diltiazem (CARDIZEM CD) 24 hr capsule 120 mg  120 mg Oral Daily Merlyn Lot E, NP   120 mg at 11/29/22 0910   DULoxetine (CYMBALTA) DR capsule 20 mg  20 mg Oral Daily Patrecia Pour, NP   20 mg at 11/29/22 0910   fluticasone furoate-vilanterol (BREO ELLIPTA) 200-25 MCG/ACT 1 puff  1 puff Inhalation Daily Merlyn Lot E, NP   1 puff at 11/29/22 0910   gabapentin (NEURONTIN) capsule 100 mg  100 mg Oral TID PRN Patrecia Pour, NP   100 mg at 11/29/22 0910   insulin aspart (novoLOG) injection 0-5 Units  0-5 Units Subcutaneous QHS Merlyn Lot E, NP       insulin aspart (novoLOG) injection 0-9 Units  0-9 Units Subcutaneous TID WC Merlyn Lot E, NP   1 Units at 11/28/22 1634   isosorbide mononitrate (IMDUR) 24 hr tablet 15 mg  15 mg Oral Daily Merlyn Lot E, NP   15 mg at 11/29/22 0910   OLANZapine zydis (ZYPREXA) disintegrating tablet 5 mg  5 mg Oral Q8H PRN  Mallie Darting, NP       And   LORazepam (ATIVAN) tablet 1 mg  1 mg Oral PRN Mallie Darting, NP       And   ziprasidone (GEODON) injection 20 mg  20 mg Intramuscular PRN Merlyn Lot E, NP       magnesium hydroxide (MILK OF MAGNESIA) suspension 30 mL  30 mL Oral Daily PRN Mallie Darting, NP       multivitamin with minerals tablet 1 tablet  1 tablet Oral Daily Parks Ranger, DO   1 tablet at 11/29/22 0909   polyethylene glycol (MIRALAX / GLYCOLAX) packet 17 g  17 g Oral BID Dorothe Pea, RPH   17 g at 11/29/22 0910   traZODone (DESYREL) tablet 50 mg  50 mg Oral QHS Patrecia Pour, NP   50 mg at 11/28/22 2121   PTA Medications: Medications Prior to Admission  Medication Sig Dispense Refill Last Dose   Accu-Chek Softclix Lancets lancets Use as directed up to four times daily 100 each 0    albuterol (VENTOLIN HFA) 108 (90 Base) MCG/ACT inhaler Inhale 1-2 puffs into the lungs every 6 (six) hours as needed for wheezing or shortness of breath. (Patient not taking: Reported on 11/14/2022) 6.7 g 0    atorvastatin (LIPITOR) 40 MG tablet Take 1 tablet (  40 mg total) by mouth daily. (Patient not taking: Reported on 11/14/2022) 30 tablet 0    Blood Glucose Monitoring Suppl (ACCU-CHEK GUIDE) w/Device KIT Use as directed. 1 kit 0    budesonide-formoterol (SYMBICORT) 160-4.5 MCG/ACT inhaler Inhale 2 puffs into the lungs in the morning and at bedtime. (Patient not taking: Reported on 11/14/2022) 10.2 g 0    diltiazem (CARDIZEM CD) 120 MG 24 hr capsule Take 1 capsule (120 mg total) by mouth daily. (Patient not taking: Reported on 11/14/2022) 30 capsule 0    ferrous sulfate 325 (65 FE) MG tablet Take 1 tablet (325 mg total) by mouth daily with breakfast. (Patient not taking: Reported on 11/14/2022) 30 tablet 0    FLUoxetine (PROZAC) 20 MG capsule Take 1 capsule (20 mg total) by mouth daily. (Patient not taking: Reported on 11/14/2022) 30 capsule 0    gabapentin (NEURONTIN) 300 MG capsule Take 1 capsule (300  mg total) by mouth at bedtime. (Patient not taking: Reported on 11/14/2022) 30 capsule 0    glucose blood test strip use as directed up to four times daily 100 each 0    hydrALAZINE (APRESOLINE) 50 MG tablet Take 1 tablet (50 mg total) by mouth every 8 (eight) hours. (Patient not taking: Reported on 11/14/2022) 90 tablet 0    Insulin Pen Needle 31G X 5 MM MISC Use with insulin pens (Patient taking differently: 1 each by Other route See admin instructions. Use with insulin pens) 100 each 1    Insulin Pen Needle 32G X 4 MM MISC Use as directed up to 4 times daily. 100 each 0    isosorbide mononitrate (IMDUR) 30 MG 24 hr tablet Take 0.5 tablets (15 mg total) by mouth daily. (Patient not taking: Reported on 11/14/2022) 15 tablet 0    meclizine (ANTIVERT) 25 MG tablet Take 1 tablet (25 mg total) by mouth 3 (three) times daily as needed for dizziness. (Patient not taking: Reported on 10/22/2022) 30 tablet 0    nitroGLYCERIN (NITROSTAT) 0.4 MG SL tablet Place 1 tablet (0.4 mg total) under the tongue every 5 (five) minutes as needed for chest pain. (Patient not taking: Reported on 10/22/2022) 25 tablet 0    polyethylene glycol powder (GLYCOLAX/MIRALAX) 17 GM/SCOOP powder Take 17 g by mouth 2 (two) times daily. (Patient not taking: Reported on 10/22/2022) 238 g 0     Patient Stressors: Financial difficulties   Health problems   Substance abuse    Patient Strengths: Marketing executive fund of knowledge   Treatment Modalities: Medication Management, Group therapy, Case management,  1 to 1 session with clinician, Psychoeducation, Recreational therapy.   Physician Treatment Plan for Primary Diagnosis: Major depressive disorder, recurrent, severe without psychotic features ([place]) Long Term Goal(s): Improvement in symptoms so as ready for discharge   Short Term Goals: Ability to identify changes in lifestyle to reduce recurrence of condition will improve Ability to verbalize feelings will  improve Ability to disclose and discuss suicidal ideas Ability to demonstrate self-control will improve Ability to identify and develop effective coping behaviors will improve Ability to maintain clinical measurements within normal limits will improve Compliance with prescribed medications will improve Ability to identify triggers associated with substance abuse/mental health issues will improve  Medication Management: Evaluate patient's response, side effects, and tolerance of medication regimen.  Therapeutic Interventions: 1 to 1 sessions, Unit Group sessions and Medication administration.  Evaluation of Outcomes: Not Met  Physician Treatment Plan for Secondary Diagnosis: Principal Problem:   Major depressive disorder,  recurrent, severe without psychotic features ([place]) Active Problems:   Cocaine use disorder ([place])   MDD (major depressive disorder), recurrent episode, severe ([place])  Long Term Goal(s): Improvement in symptoms so as ready for discharge   Short Term Goals: Ability to identify changes in lifestyle to reduce recurrence of condition will improve Ability to verbalize feelings will improve Ability to disclose and discuss suicidal ideas Ability to demonstrate self-control will improve Ability to identify and develop effective coping behaviors will improve Ability to maintain clinical measurements within normal limits will improve Compliance with prescribed medications will improve Ability to identify triggers associated with substance abuse/mental health issues will improve     Medication Management: Evaluate patient's response, side effects, and tolerance of medication regimen.  Therapeutic Interventions: 1 to 1 sessions, Unit Group sessions and Medication administration.  Evaluation of Outcomes: Not Met   RN Treatment Plan for Primary Diagnosis: Major depressive disorder, recurrent, severe without psychotic features ([place]) Long Term Goal(s): Knowledge of disease and  therapeutic regimen to maintain health will improve  Short Term Goals: Ability to remain free from injury will improve, Ability to verbalize frustration and anger appropriately will improve, Ability to demonstrate self-control, Ability to participate in decision making will improve, Ability to verbalize feelings will improve, Ability to disclose and discuss suicidal ideas, Ability to identify and develop effective coping behaviors will improve, and Compliance with prescribed medications will improve  Medication Management: RN will administer medications as ordered by provider, will assess and evaluate patient's response and provide education to patient for prescribed medication. RN will report any adverse and/or side effects to prescribing provider.  Therapeutic Interventions: 1 on 1 counseling sessions, Psychoeducation, Medication administration, Evaluate responses to treatment, Monitor vital signs and CBGs as ordered, Perform/monitor CIWA, COWS, AIMS and Fall Risk screenings as ordered, Perform wound care treatments as ordered.  Evaluation of Outcomes: Not Met   LCSW Treatment Plan for Primary Diagnosis: Major depressive disorder, recurrent, severe without psychotic features ([place]) Long Term Goal(s): Safe transition to appropriate next level of care at discharge, Engage patient in therapeutic group addressing interpersonal concerns.  Short Term Goals: Engage patient in aftercare planning with referrals and resources, Increase social support, Increase ability to appropriately verbalize feelings, Increase emotional regulation, Facilitate acceptance of mental health diagnosis and concerns, Facilitate patient progression through stages of change regarding substance use diagnoses and concerns, Identify triggers associated with mental health/substance abuse issues, and Increase skills for wellness and recovery  Therapeutic Interventions: Assess for all discharge needs, 1 to 1 time with Social worker,  Explore available resources and support systems, Assess for adequacy in community support network, Educate family and significant other(s) on suicide prevention, Complete Psychosocial Assessment, Interpersonal group therapy.  Evaluation of Outcomes: Not Met   Progress in Treatment: Attending groups: Yes. Participating in groups: Yes. Taking medication as prescribed: Yes. Toleration medication: Yes. Family/Significant other contact made: No, will contact:  when given permission Patient understands diagnosis: Yes. Discussing patient identified problems/goals with staff: Yes. Medical problems stabilized or resolved: Yes. Denies suicidal/homicidal ideation: Yes. Issues/concerns per patient self-inventory: No. Other: None  New problem(s) identified: No, Describe:  None  New Short Term/Long Term Goal(s): Patient to work towards detox, medication management for mood stabilization; elimination of SI thoughts; development of comprehensive mental wellness/sobriety plan.   Patient Goals:  "to get my physical and mental health better"  Discharge Plan or Barriers: CSW will assist pt with development of appropriate discharge/aftercare plan.   Reason for Continuation of Hospitalization: Depression Medication stabilization  Estimated Length of Stay: 1-7 days  Last 3 [place] Suicide Severity Risk Score: Flowsheet Row Admission (Current) from 11/27/2022 in [place] ED from 11/26/2022 in [facility] Emergency Department [facility] ED from 11/16/2022 in [facility] Emergency Department at [place] Error: Q7 should not be populated when Q6 is No Low Risk Low Risk       Last PHQ 2/9 Scores:    11/12/2021   12:28 AM 11/04/2021    3:12 AM 10/07/2021    4:31 PM  Depression screen PHQ 2/9  Decreased Interest '2 2 2  '$ Down, Depressed, Hopeless '3 3 2  '$ PHQ - 2 Score '5 5 4  '$ Altered sleeping '2 2 2  '$ Tired, decreased energy 3 2 0  Change  in appetite '1 1 1  '$ Feeling bad or failure about yourself  '2 2 2  '$ Trouble concentrating 0 1 0  Moving slowly or fidgety/restless 0 1 0  Suicidal thoughts '3 2 1  '$ PHQ-9 Score '16 16 10  '$ Difficult doing work/chores Extremely dIfficult Very difficult Very difficult    Scribe for Treatment Team: Rodina Pinales [name], [place] 11/29/2022 10:45 AM

## 2022-11-29 NOTE — Progress Notes (Signed)
   11/29/22 2045  Psych Admission Type (Psych Patients Only)  Admission Status Voluntary  Psychosocial Assessment  Patient Complaints Restlessness;Depression  Eye Contact Fair  Facial Expression Sad  Affect Appropriate to circumstance  Speech Soft;Logical/coherent  Interaction Minimal  Motor Activity Slow  Appearance/Hygiene Unremarkable;In scrubs  Behavior Characteristics Cooperative;Appropriate to situation  Mood Sad;Pleasant  Thought Process  Coherency WDL  Content WDL  Delusions None reported or observed  Perception WDL  Hallucination None reported or observed  Judgment Limited  Confusion None  Danger to Self  Current suicidal ideation? Denies  Agreement Not to Harm Self Yes  Description of Agreement verbal  Danger to Others  Danger to Others None reported or observed   Progress note   D: Pt seen in dayroom. Pt denies SI, HI, AVH. Contracts for safety. Pt rates pain  7/10 as chronic headache and left side generalized pain. Pt rates anxiety  7/10 and depression  7/10. Pt concerned about housing. Says he can look forward today and see some hope. Pt commended for his optimism. Pt has been attending groups. Slept well last night. Appetite fair. Given paper scrubs to change into so he can have current scrubs washed. Pt asked about a clothing closet. Pt just had clothes on his back and they were locked up because of strings. No other concerns noted at this time.  A: Pt provided support and encouragement. Pt given scheduled medication as prescribed. PRNs as appropriate. Q15 min checks for safety.   R: Pt safe on the unit. Will continue to monitor.

## 2022-11-29 NOTE — Progress Notes (Signed)
[facility] MD Progress Note  11/29/2022 3:01 PM Michael [name]  MRN:  [id number]  Subjective:  72 yo male admitted for depression and suicidal ideations, cocaine abuse.  He is pleasant and cooperative.  Met with client in the treatment team.  He reports his depression and anxiety have improved, moderate to high.  Denies suicidal ideations. Sleep was "good" last night, appetite is "good".  Denies any cravings for cocaine.  His goals were to make better choice and address his health problems.  No side effects from his medications.  Principal Problem: Major depressive disorder, recurrent, severe without psychotic features ([place]) Diagnosis: Principal Problem:   Major depressive disorder, recurrent, severe without psychotic features ([place]) Active Problems:   Cocaine use disorder ([name])   MDD (major depressive disorder), recurrent episode, severe ([place])  Total Time spent with patient: 45 minutes  Past Psychiatric History: depression, anxiety, cocaine abuse  Past Medical History:  Past Medical History:  Diagnosis Date   A-fib ([place])    Anemia    Asthma    No PFTs, history of childhood asthma   CAD (coronary artery disease)    Cellulitis 04/2014   left facial   Cellulitis and abscess of toe of right foot 12/08/2019   Chondromalacia of medial femoral condyle    Left knee MRI 04/28/12: Chondromalacia of the medial femoral condyle with slight peripheral degeneration of the meniscocapsular junction of the medial meniscus; followed by sports medicine   Chronic kidney failure, stage 4 (severe) ([place])    Collagen vascular disease ([place])    Crack cocaine use    for 20+ years, has been enrolled in detox programs in the past   Depression    with history of hospitalization for suicidal ideation   Diabetes mellitus 2002   Diagnosed in 2002, started insulin in 2012   Gout    Headache(784.0)    CT head 08/2011: Periventricular and subcortical white matter hypodensities are most in keeping with chronic  microangiopathic change   HIV infection ([place]) 08/2011   Followed by [name]   Hyperlipidemia    Hypertension    Pulmonary embolism [facility])     Past Surgical History:  Procedure Laterality Date   AMPUTATION Right 07/21/2019   Procedure: RIGHT SECOND TOE AMPUTATION;  Surgeon: [name], MD;  Location: [place];  Service: Orthopedics;  Laterality: Right;   BACK SURGERY     1988   BOWEL RESECTION     CARDIAC SURGERY     CERVICAL SPINE SURGERY     " rods in my neck "   CORONARY ARTERY BYPASS GRAFT     CORONARY STENT PLACEMENT     NM MYOCAR PERF WALL MOTION  12/27/2011   normal   SPINE SURGERY     Family History:  Family History  Problem Relation Age of Onset   Diabetes Mother    Hypertension Mother    Hyperlipidemia Mother    Diabetes Father    Cancer Father    Hypertension Father    Diabetes Brother    Heart disease Brother    Diabetes Sister    Colon cancer Neg Hx    Family Psychiatric  History: none Social History:  Social History   Substance and Sexual Activity  Alcohol Use No   Alcohol/week: 4.0 standard drinks of alcohol   Types: 2 Cans of beer, 2 Shots of liquor per week     Social History   Substance and Sexual Activity  Drug Use Not Currently  Frequency: 4.0 times per week   Types: "Crack" cocaine, Cocaine   Comment: Recent use of crack    Social History   Socioeconomic History   Marital status: Widowed    Spouse name: Not on file   Number of children: 2   Years of education: 92   Highest education level: 12th grade  Occupational History    Employer: UNEMPLOYED    Comment: 04/2016  Tobacco Use   Smoking status: Never   Smokeless tobacco: Never  Vaping Use   Vaping Use: Never used  Substance and Sexual Activity   Alcohol use: No    Alcohol/week: 4.0 standard drinks of alcohol    Types: 2 Cans of beer, 2 Shots of liquor per week   Drug use: Not Currently    Frequency: 4.0 times per week    Types: "Crack" cocaine, Cocaine    Comment:  Recent use of crack   Sexual activity: Yes    Comment: DECLINED CONDOMS  Other Topics Concern   Not on file  Social History Narrative   Currently staying with a friend in [place].  Was staying @ local motel until a few days ago - left b/c of bed bugs.   P[facility].  Not followed by a psychiatrist.   Social Determinants of Health   Financial Resource Strain: Medium Risk (09/16/2018)   Overall Financial Resource Strain (CARDIA)    Difficulty of Paying Living Expenses: Somewhat hard  Food Insecurity: No Food Insecurity (10/22/2022)   Hunger Vital Sign    Worried About Running Out of Food in the Last Year: Never true    [place] in the Last Year: Never true  Recent Concern: [place] Present (09/10/2022)   Hunger Vital Sign    Worried About Running Out of Food in the Last Year: Sometimes true    Ran Out of Food in the Last Year: Sometimes true  Transportation Needs: Unmet Transportation Needs (10/22/2022)   PRAPARE - Hydrologist (Medical): Yes    Lack of Transportation (Non-Medical): Yes  Physical Activity: Unknown (04/22/2019)   Exercise Vital Sign    Days of Exercise per Week: Not on file    Minutes of Exercise per Session: 0 min  Stress: Stress Concern Present (09/16/2018)   [place]    Feeling of Stress : Very much  Social Connections: Socially Isolated (09/16/2018)   Social Connection and Isolation Panel [NHANES]    Frequency of Communication with Friends and Family: Once a week    Frequency of Social Gatherings with Friends and Family: Never    Attends Religious Services: Never    Marine scientist or Organizations: No    Attends Archivist Meetings: Never    Marital Status: Widowed   Additional Social History:     Sleep: Good  Appetite:  Good  Current Medications: Current Facility-Administered Medications   Medication Dose Route Frequency Provider Last Rate Last Admin   acetaminophen (TYLENOL) tablet 650 mg  650 mg Oral Q6H PRN Merlyn Lot E, NP   650 mg at 11/29/22 0909   albuterol (VENTOLIN HFA) 108 (90 Base) MCG/ACT inhaler 1-2 puff  1-2 puff Inhalation Q6H PRN Mallie Darting, NP       alum & mag hydroxide-simeth (MAALOX/MYLANTA) 200-200-20 MG/5ML suspension 30 mL  30 mL Oral Q4H PRN Mallie Darting, NP  atorvastatin (LIPITOR) tablet 40 mg  40 mg Oral Daily Merlyn Lot E, NP   40 mg at 11/29/22 0910   diltiazem (CARDIZEM CD) 24 hr capsule 120 mg  120 mg Oral Daily Merlyn Lot E, NP   120 mg at 11/29/22 0910   DULoxetine (CYMBALTA) DR capsule 20 mg  20 mg Oral Daily Patrecia Pour, NP   20 mg at 11/29/22 0910   fluticasone furoate-vilanterol (BREO ELLIPTA) 200-25 MCG/ACT 1 puff  1 puff Inhalation Daily Merlyn Lot E, NP   1 puff at 11/29/22 0910   gabapentin (NEURONTIN) capsule 100 mg  100 mg Oral TID PRN Patrecia Pour, NP   100 mg at 11/29/22 0910   insulin aspart (novoLOG) injection 0-5 Units  0-5 Units Subcutaneous QHS Merlyn Lot E, NP       insulin aspart (novoLOG) injection 0-9 Units  0-9 Units Subcutaneous TID WC Merlyn Lot E, NP   2 Units at 11/29/22 1153   isosorbide mononitrate (IMDUR) 24 hr tablet 15 mg  15 mg Oral Daily Merlyn Lot E, NP   15 mg at 11/29/22 0910   OLANZapine zydis (ZYPREXA) disintegrating tablet 5 mg  5 mg Oral Q8H PRN Mallie Darting, NP       And   LORazepam (ATIVAN) tablet 1 mg  1 mg Oral PRN Mallie Darting, NP       And   ziprasidone (GEODON) injection 20 mg  20 mg Intramuscular PRN Merlyn Lot E, NP       magnesium hydroxide (MILK OF MAGNESIA) suspension 30 mL  30 mL Oral Daily PRN Mallie Darting, NP       multivitamin with minerals tablet 1 tablet  1 tablet Oral Daily Parks Ranger, DO   1 tablet at 11/29/22 [id number]   polyethylene glycol (MIRALAX / GLYCOLAX) packet 17 g  17 g Oral BID Dorothe Pea, RPH   17 g at 11/29/22  0910   traZODone (DESYREL) tablet 50 mg  50 mg Oral QHS Patrecia Pour, NP   50 mg at 11/28/22 2121    Lab Results:  Results for orders placed or performed during the hospital encounter of 11/27/22 (from the past 48 hour(s))  Glucose, capillary     Status: None   Collection Time: 11/27/22  5:24 PM  Result Value Ref Range   Glucose-Capillary 99 70 - 99 mg/dL    Comment: Glucose reference range applies only to samples taken after fasting for at least 8 hours.  Glucose, capillary     Status: Abnormal   Collection Time: 11/27/22  8:31 PM  Result Value Ref Range   Glucose-Capillary 139 (H) 70 - 99 mg/dL    Comment: Glucose reference range applies only to samples taken after fasting for at least 8 hours.  Glucose, capillary     Status: Abnormal   Collection Time: 11/28/22  7:52 AM  Result Value Ref Range   Glucose-Capillary 112 (H) 70 - 99 mg/dL    Comment: Glucose reference range applies only to samples taken after fasting for at least 8 hours.  Glucose, capillary     Status: Abnormal   Collection Time: 11/28/22 11:35 AM  Result Value Ref Range   Glucose-Capillary 137 (H) 70 - 99 mg/dL    Comment: Glucose reference range applies only to samples taken after fasting for at least 8 hours.  Glucose, capillary     Status: Abnormal   Collection Time: 11/28/22  4:24 PM  Result  Value Ref Range   Glucose-Capillary 146 (H) 70 - 99 mg/dL    Comment: Glucose reference range applies only to samples taken after fasting for at least 8 hours.  Glucose, capillary     Status: Abnormal   Collection Time: 11/29/22  7:48 AM  Result Value Ref Range   Glucose-Capillary 120 (H) 70 - 99 mg/dL    Comment: Glucose reference range applies only to samples taken after fasting for at least 8 hours.  Glucose, capillary     Status: Abnormal   Collection Time: 11/29/22 11:48 AM  Result Value Ref Range   Glucose-Capillary 164 (H) 70 - 99 mg/dL    Comment: Glucose reference range applies only to samples taken after  fasting for at least 8 hours.    Blood Alcohol level:  Lab Results  Component Value Date   ETH <10 11/26/2022   ETH <10 [id number]    Metabolic Disorder Labs: Lab Results  Component Value Date   HGBA1C 6.8 (H) 09/30/2022   MPG 148 09/30/2022   MPG 174.29 07/02/2022   No results found for: "PROLACTIN" Lab Results  Component Value Date   CHOL 149 04/25/2021   TRIG 166 (H) 04/25/2021   HDL 57 04/25/2021   CHOLHDL 2.6 04/25/2021   VLDL 27 12/09/2019   LDLCALC 68 04/25/2021   LDLCALC 120 (H) 12/09/2019     Musculoskeletal: Strength & Muscle Tone: within normal limits Gait & Station: normal Patient leans: N/A  Psychiatric Specialty Exam: Physical Exam Vitals and nursing note reviewed.  Constitutional:      Appearance: Normal appearance.  HENT:     Head: Normocephalic.     Nose: Nose normal.  Pulmonary:     Effort: Pulmonary effort is normal.  Musculoskeletal:        General: Normal range of motion.     Cervical back: Normal range of motion.  Neurological:     General: No focal deficit present.     Mental Status: He is alert and oriented to person, place, and time.  Psychiatric:        Attention and Perception: Attention and perception normal.        Mood and Affect: Mood is anxious and depressed.        Speech: Speech normal.        Behavior: Behavior normal. Behavior is cooperative.        Thought Content: Thought content normal.        Cognition and Memory: Cognition and memory normal.        Judgment: Judgment normal.     Review of Systems  Psychiatric/Behavioral:  Positive for depression and substance abuse. The patient is nervous/anxious.   All other systems reviewed and are negative.   Blood pressure (!) 162/92, pulse (!) 57, temperature (!) 97.5 F (36.4 C), temperature source Oral, resp. rate 18, height '5\' 8"'$  (1.727 m), weight 67.2 kg, SpO2 100 %.Body mass index is 22.53 kg/m.  General Appearance: Casual  Eye Contact:  Good  Speech:  Normal Rate   Volume:  Normal  Mood:  Anxious and Depressed  Affect:  Congruent  Thought Process:  Coherent  Orientation:  Full (Time, Place, and Person)  Thought Content:  Rumination  Suicidal Thoughts:  No  Homicidal Thoughts:  No  Memory:  Immediate;   Good Recent;   Good Remote;   Good  Judgement:  Fair  Insight:  Fair  Psychomotor Activity:  Normal  Concentration:  Concentration: Good and Attention Span: Good  Recall:  Good  Fund of Knowledge:  Good  Language:  Good  Akathisia:  No  Handed:  Right  AIMS (if indicated):     Assets:  Leisure Time Resilience  ADL's:  Intact  Cognition:  WNL  Sleep:         Physical Exam: Physical Exam Vitals and nursing note reviewed.  Constitutional:      Appearance: Normal appearance.  HENT:     Head: Normocephalic.     Nose: Nose normal.  Pulmonary:     Effort: Pulmonary effort is normal.  Musculoskeletal:        General: Normal range of motion.     Cervical back: Normal range of motion.  Neurological:     General: No focal deficit present.     Mental Status: He is alert and oriented to person, place, and time.  Psychiatric:        Attention and Perception: Attention and perception normal.        Mood and Affect: Mood is anxious and depressed.        Speech: Speech normal.        Behavior: Behavior normal. Behavior is cooperative.        Thought Content: Thought content normal.        Cognition and Memory: Cognition and memory normal.        Judgment: Judgment normal.    Review of Systems  Psychiatric/Behavioral:  Positive for depression and substance abuse. The patient is nervous/anxious.   All other systems reviewed and are negative.  Blood pressure (!) 162/92, pulse (!) 57, temperature (!) 97.5 F (36.4 C), temperature source Oral, resp. rate 18, height '5\' 8"'$  (1.727 m), weight 67.2 kg, SpO2 100 %. Body mass index is 22.53 kg/m.   Treatment Plan Summary: Daily contact with patient to assess and evaluate symptoms and  progress in treatment, Medication management, and Plan : Major depressive disorder, recurrent, severe without psychosis: Started Cymbalta 20 mg daily   Anxiety: Started gabapentin 100 mg TID PRN   Insomnia: Trazodone 50 mg at bedtime  Waylan Boga, NP 11/29/2022, 3:01 PM

## 2022-11-29 NOTE — Group Note (Signed)
Recreation Therapy Group Note   Group Topic:Leisure Education  Group Date: 11/29/2022 Start Time: 1400 End Time: 1445 Facilitators: Vilma Prader, LRT, CTRS Location:  Dayroom  Group Description: Seated Exercise. Patients were given the choice to do exercise or listen to their favorite music songs. Pts chose to complete seated exercise. LRT and patients discussed the importance of having leisure interests post discharge, and how exercise can be one of them.   Affect/Mood: Appropriate and Flat   Participation Level: Minimal   Participation Quality: Minimal Cues   Behavior: Distracted   Speech/Thought Process: Coherent   Insight: Fair   Judgement: Fair    Modes of Intervention: Activity and Education   Patient Response to Interventions:  Disengaged   Education Outcome:  In group clarification offered    Clinical Observations/Individualized Feedback: Michael [name] was minimally active in their participation of session activities and group discussion. Pt was very interested in an information packed about placement options given by social work and chose to read over it throughout group. Pt did not complete any exercises. Pt asked LRT if it was possible he could see what the different possible placement facilities looked like. LRT let pt know that she would notify the social worker and that the social worker may be able to assist him in that. Social worker made aware.   Plan: Continue to engage patient in RT group sessions 2-3x/week.   Vilma Prader, LRT, [place] 11/29/2022 2:52 PM

## 2022-11-29 NOTE — Group Note (Signed)
LCSW Group Therapy Note  Group Date: 11/29/2022 Start Time: 1300 End Time: 1350   Type of Therapy and Topic:  Group Therapy - Healthy vs Unhealthy Coping Skills  Participation Level:  None   Description of Group The focus of this group was to determine what unhealthy coping techniques typically are used by group members and what healthy coping techniques would be helpful in coping with various problems. Patients were guided in becoming aware of the differences between healthy and unhealthy coping techniques. Patients were asked to identify 2-3 healthy coping skills they would like to learn to use more effectively.  Therapeutic Goals Patients learned that coping is what human beings do all day long to deal with various situations in their lives Patients defined and discussed healthy vs unhealthy coping techniques Patients identified their preferred coping techniques and identified whether these were healthy or unhealthy Patients determined 2-3 healthy coping skills they would like to become more familiar with and use more often. Patients provided support and ideas to each other   Summary of Patient Progress:  During group, patient did not express anything due to choosing to going over resources CSW had provided him in place of directly participating in activity.   Therapeutic Modalities Cognitive Behavioral Therapy Motivational Interviewing  Michael [name] [name], Latanya Presser 11/29/2022  2:05 PM

## 2022-11-30 DIAGNOSIS — F332 Major depressive disorder, recurrent severe without psychotic features: Secondary | ICD-10-CM | POA: Diagnosis not present

## 2022-11-30 LAB — GLUCOSE, CAPILLARY
Glucose-Capillary: 105 mg/dL — ABNORMAL HIGH (ref 70–99)
Glucose-Capillary: 118 mg/dL — ABNORMAL HIGH (ref 70–99)
Glucose-Capillary: 142 mg/dL — ABNORMAL HIGH (ref 70–99)
Glucose-Capillary: 134 mg/dL — ABNORMAL HIGH (ref 70–99)

## 2022-11-30 NOTE — Group Note (Signed)
LCSW Group Therapy Note   Group Date: 11/30/2022 Start Time: 1330 End Time: 1430   Type of Therapy and Topic:  Group Therapy: Challenging Core Beliefs  Participation Level:  None  Description of Group:  Patients were educated about core beliefs and asked to identify one harmful core belief that they have. Patients were asked to explore from where those beliefs originate. Patients were asked to discuss how those beliefs make them feel and the resulting behaviors of those beliefs. They were then be asked if those beliefs are true and, if so, what evidence they have to support them. Lastly, group members were challenged to replace those negative core beliefs with helpful beliefs.   Therapeutic Goals:   1. Patient will identify harmful core beliefs and explore the origins of such beliefs. 2. Patient will identify feelings and behaviors that result from those core beliefs. 3. Patient will discuss whether such beliefs are true. 4.  Patient will replace harmful core beliefs with helpful ones.  Summary of Patient Progress:  Michael [name] attended group however, did not participate in conversation. Pt was able to listne has peers processed and explored how core beliefs are formed and how they impact thoughts, feelings, and behaviors. Patient demonstrated minimal insight into the subject matter, was respectful and supportive of peers, and participated throughout the entire session.  Therapeutic Modalities: Cognitive Behavioral Therapy; Solution-Focused Therapy   Vassie Moselle, LCSW 11/30/2022  2:29 PM

## 2022-11-30 NOTE — Progress Notes (Signed)
[facility] MD Progress Note  11/30/2022 1:20 PM Michael [name]  MRN:  [id number] Subjective: Michael [name] is seen on rounds.  He does not have any complaints.  He has been compliant with medications.  Nurses report no issues. Principal Problem: Major depressive disorder, recurrent, severe without psychotic features ([place]) Diagnosis: Principal Problem:   Major depressive disorder, recurrent, severe without psychotic features ([place]) Active Problems:   Cocaine use disorder ([name])   MDD (major depressive disorder), recurrent episode, severe ([place])  Total Time spent with patient: 15 minutes  Past Psychiatric History: Depression  Past Medical History:  Past Medical History:  Diagnosis Date   A-fib ([place])    Anemia    Asthma    No PFTs, history of childhood asthma   CAD (coronary artery disease)    Cellulitis 04/2014   left facial   Cellulitis and abscess of toe of right foot 12/08/2019   Chondromalacia of medial femoral condyle    Left knee MRI 04/28/12: Chondromalacia of the medial femoral condyle with slight peripheral degeneration of the meniscocapsular junction of the medial meniscus; followed by sports medicine   Chronic kidney failure, stage 4 (severe) ([place])    Collagen vascular disease ([place])    Crack cocaine use    for 20+ years, has been enrolled in detox programs in the past   Depression    with history of hospitalization for suicidal ideation   Diabetes mellitus 2002   Diagnosed in 2002, started insulin in 2012   Gout    Headache(784.0)    CT head 08/2011: Periventricular and subcortical white matter hypodensities are most in keeping with chronic microangiopathic change   HIV infection ([place]) 08/2011   Followed by [name]   Hyperlipidemia    Hypertension    Pulmonary embolism [facility])     Past Surgical History:  Procedure Laterality Date   AMPUTATION Right 07/21/2019   Procedure: RIGHT SECOND TOE AMPUTATION;  Surgeon: [name], MD;  Location: [place];  Service: Orthopedics;   Laterality: Right;   BACK SURGERY     1988   BOWEL RESECTION     CARDIAC SURGERY     CERVICAL SPINE SURGERY     " rods in my neck "   CORONARY ARTERY BYPASS GRAFT     CORONARY STENT PLACEMENT     NM MYOCAR PERF WALL MOTION  12/27/2011   normal   SPINE SURGERY     Family History:  Family History  Problem Relation Age of Onset   Diabetes Mother    Hypertension Mother    Hyperlipidemia Mother    Diabetes Father    Cancer Father    Hypertension Father    Diabetes Brother    Heart disease Brother    Diabetes Sister    Colon cancer Neg Hx    Family Psychiatric  History: Unremarkable Social History:  Social History   Substance and Sexual Activity  Alcohol Use No   Alcohol/week: 4.0 standard drinks of alcohol   Types: 2 Cans of beer, 2 Shots of liquor per week     Social History   Substance and Sexual Activity  Drug Use Not Currently   Frequency: 4.0 times per week   Types: "Crack" cocaine, Cocaine   Comment: Recent use of crack    Social History   Socioeconomic History   Marital status: Widowed    Spouse name: Not on file   Number of children: 2   Years of education: 12   Highest education level:  12th grade  Occupational History    Employer: UNEMPLOYED    Comment: 04/2016  Tobacco Use   Smoking status: Never   Smokeless tobacco: Never  Vaping Use   Vaping Use: Never used  Substance and Sexual Activity   Alcohol use: No    Alcohol/week: 4.0 standard drinks of alcohol    Types: 2 Cans of beer, 2 Shots of liquor per week   Drug use: Not Currently    Frequency: 4.0 times per week    Types: "Crack" cocaine, Cocaine    Comment: Recent use of crack   Sexual activity: Yes    Comment: DECLINED CONDOMS  Other Topics Concern   Not on file  Social History Narrative   Currently staying with a friend in [place].  Was staying @ local motel until a few days ago - left b/c of bed bugs.   Picked up from [facility].  Not followed by a psychiatrist.   Social Determinants  of Health   Financial Resource Strain: Medium Risk (09/16/2018)   Overall Financial Resource Strain (CARDIA)    Difficulty of Paying Living Expenses: Somewhat hard  Food Insecurity: No Food Insecurity (10/22/2022)   Hunger Vital Sign    Worried About Running Out of Food in the Last Year: Never true    [place] in the Last Year: Never true  Recent Concern: [place] Present (09/10/2022)   Hunger Vital Sign    Worried About Running Out of Food in the Last Year: Sometimes true    Ran Out of Food in the Last Year: Sometimes true  Transportation Needs: Unmet Transportation Needs (10/22/2022)   PRAPARE - Hydrologist (Medical): Yes    Lack of Transportation (Non-Medical): Yes  Physical Activity: Unknown (04/22/2019)   Exercise Vital Sign    Days of Exercise per Week: Not on file    Minutes of Exercise per Session: 0 min  Stress: Stress Concern Present (09/16/2018)   [place]    Feeling of Stress : Very much  Social Connections: Socially Isolated (09/16/2018)   Social Connection and Isolation Panel [NHANES]    Frequency of Communication with Friends and Family: Once a week    Frequency of Social Gatherings with Friends and Family: Never    Attends Religious Services: Never    Marine scientist or Organizations: No    Attends Archivist Meetings: Never    Marital Status: Widowed   Additional Social History:                         Sleep: Good  Appetite:  Good  Current Medications: Current Facility-Administered Medications  Medication Dose Route Frequency Provider Last Rate Last Admin   acetaminophen (TYLENOL) tablet 650 mg  650 mg Oral Q6H PRN Merlyn Lot E, NP   650 mg at 11/30/22 0929   albuterol (VENTOLIN HFA) 108 (90 Base) MCG/ACT inhaler 1-2 puff  1-2 puff Inhalation Q6H PRN Mallie Darting, NP       alum & mag hydroxide-simeth  (MAALOX/MYLANTA) 200-200-20 MG/5ML suspension 30 mL  30 mL Oral Q4H PRN Merlyn Lot E, NP       atorvastatin (LIPITOR) tablet 40 mg  40 mg Oral Daily Merlyn Lot E, NP   40 mg at 11/30/22 0929   diltiazem (CARDIZEM CD) 24 hr capsule 120 mg  120 mg Oral Daily Jerelene Redden,  Collie Siad, NP   120 mg at 11/30/22 0929   DULoxetine (CYMBALTA) DR capsule 20 mg  20 mg Oral Daily Patrecia Pour, NP   20 mg at 11/30/22 0929   fluticasone furoate-vilanterol (BREO ELLIPTA) 200-25 MCG/ACT 1 puff  1 puff Inhalation Daily Merlyn Lot E, NP   1 puff at 11/30/22 [id number]   gabapentin (NEURONTIN) capsule 100 mg  100 mg Oral TID PRN Patrecia Pour, NP   100 mg at 11/30/22 0930   insulin aspart (novoLOG) injection 0-5 Units  0-5 Units Subcutaneous QHS Merlyn Lot E, NP       insulin aspart (novoLOG) injection 0-9 Units  0-9 Units Subcutaneous TID WC Merlyn Lot E, NP   2 Units at 11/29/22 1153   isosorbide mononitrate (IMDUR) 24 hr tablet 15 mg  15 mg Oral Daily Merlyn Lot E, NP   15 mg at 11/30/22 0929   OLANZapine zydis (ZYPREXA) disintegrating tablet 5 mg  5 mg Oral Q8H PRN Mallie Darting, NP       And   LORazepam (ATIVAN) tablet 1 mg  1 mg Oral PRN Mallie Darting, NP       And   ziprasidone (GEODON) injection 20 mg  20 mg Intramuscular PRN Merlyn Lot E, NP       magnesium hydroxide (MILK OF MAGNESIA) suspension 30 mL  30 mL Oral Daily PRN Merlyn Lot E, NP   30 mL at 11/30/22 [id number]   multivitamin with minerals tablet 1 tablet  1 tablet Oral Daily Parks Ranger, DO   1 tablet at 11/30/22 [id number]   polyethylene glycol (MIRALAX / GLYCOLAX) packet 17 g  17 g Oral BID Dorothe Pea, RPH   17 g at 11/30/22 [id number]   traZODone (DESYREL) tablet 50 mg  50 mg Oral QHS Patrecia Pour, NP   50 mg at 11/29/22 2104    Lab Results:  Results for orders placed or performed during the hospital encounter of 11/27/22 (from the past 48 hour(s))  Glucose, capillary     Status: Abnormal   Collection Time: 11/28/22   4:24 PM  Result Value Ref Range   Glucose-Capillary 146 (H) 70 - 99 mg/dL    Comment: Glucose reference range applies only to samples taken after fasting for at least 8 hours.  Glucose, capillary     Status: Abnormal   Collection Time: 11/29/22  7:48 AM  Result Value Ref Range   Glucose-Capillary 120 (H) 70 - 99 mg/dL    Comment: Glucose reference range applies only to samples taken after fasting for at least 8 hours.  Glucose, capillary     Status: Abnormal   Collection Time: 11/29/22 11:48 AM  Result Value Ref Range   Glucose-Capillary 164 (H) 70 - 99 mg/dL    Comment: Glucose reference range applies only to samples taken after fasting for at least 8 hours.  Glucose, capillary     Status: Abnormal   Collection Time: 11/29/22  4:11 PM  Result Value Ref Range   Glucose-Capillary 115 (H) 70 - 99 mg/dL    Comment: Glucose reference range applies only to samples taken after fasting for at least 8 hours.  Glucose, capillary     Status: Abnormal   Collection Time: 11/29/22  8:23 PM  Result Value Ref Range   Glucose-Capillary 107 (H) 70 - 99 mg/dL    Comment: Glucose reference range applies only to samples taken after fasting for at least 8 hours.  Glucose,  capillary     Status: Abnormal   Collection Time: 11/30/22  7:44 AM  Result Value Ref Range   Glucose-Capillary 105 (H) 70 - 99 mg/dL    Comment: Glucose reference range applies only to samples taken after fasting for at least 8 hours.   Comment 1 Notify RN   Glucose, capillary     Status: Abnormal   Collection Time: 11/30/22 11:38 AM  Result Value Ref Range   Glucose-Capillary 118 (H) 70 - 99 mg/dL    Comment: Glucose reference range applies only to samples taken after fasting for at least 8 hours.    Blood Alcohol level:  Lab Results  Component Value Date   ETH <10 11/26/2022   ETH <10 [id number]    Metabolic Disorder Labs: Lab Results  Component Value Date   HGBA1C 6.8 (H) 09/30/2022   MPG 148 09/30/2022   MPG 174.29  07/02/2022   No results found for: "PROLACTIN" Lab Results  Component Value Date   CHOL 149 04/25/2021   TRIG 166 (H) 04/25/2021   HDL 57 04/25/2021   CHOLHDL 2.6 04/25/2021   VLDL 27 12/09/2019   LDLCALC 68 04/25/2021   LDLCALC 120 (H) 12/09/2019    Physical Findings: AIMS:  , ,  ,  ,    CIWA:    COWS:     Musculoskeletal: Strength & Muscle Tone: within normal limits Gait & Station: normal Patient leans: N/A  Psychiatric Specialty Exam:  Presentation  General Appearance:  Appropriate for Environment  Eye Contact: Good  Speech: Clear and Coherent  Speech Volume: Normal  Handedness: Right   Mood and Affect  Mood: Euthymic  Affect: Appropriate   Thought Process  Thought Processes: Coherent  Descriptions of Associations:Intact  Orientation:Full (Time, Place and Person)  Thought Content:WDL  History of Schizophrenia/Schizoaffective disorder:No  Duration of Psychotic Symptoms:No data recorded Hallucinations:No data recorded Ideas of Reference:None  Suicidal Thoughts:No data recorded Homicidal Thoughts:No data recorded  Sensorium  Memory: Immediate Fair; Recent Fair  Judgment: Fair  Insight: Fair   Community education officer  Concentration: Fair  Attention Span: Fair  Recall: [place] of Knowledge: Fair  Language: Good   Psychomotor Activity  Psychomotor Activity:No data recorded  Assets  Assets: Communication Skills   Sleep  Sleep:No data recorded    Blood pressure 135/73, pulse 79, temperature 97.6 F (36.4 C), temperature source Oral, resp. rate 18, height '5\' 8"'$  (1.727 m), weight 67.2 kg, SpO2 100 %. Body mass index is 22.53 kg/m.   Treatment Plan Summary: Daily contact with patient to assess and evaluate symptoms and progress in treatment, Medication management, and Plan continue current medications.  [place], DO 11/30/2022, 1:20 PM

## 2022-11-30 NOTE — Plan of Care (Addendum)
Pt endorses anxiety/depression at this time. Pt denies SI/HI/AVH however endorses 7/10 chronic generalized body pain at this time, prn medication were given. Pt states there is no change in pain with prns. MD was notified of pt's pain and of the prns medications not providing any change in his pain experienced. Pt is calm and cooperative. Pt is medication compliant. Pt provided with support and encouragement. Pt monitored q15 minutes for safety per unit policy. Plan of care ongoing.   Problem: Nutrition: Goal: Adequate nutrition will be maintained Outcome: Progressing   Problem: Pain Managment: Goal: General experience of comfort will improve Outcome: Not Progressing

## 2022-11-30 NOTE — Group Note (Signed)
Date:  11/30/2022 Time:  6:02 PM  Group Topic/Focus:  Overcoming Stress:   The focus of this group is to define stress and help patients assess their triggers. Self Care:   The focus of this group is to help patients understand the importance of self-care in order to improve or restore emotional, physical, spiritual, interpersonal, and financial health.    Participation Level:  Active  Participation Quality:  Appropriate and Attentive  Affect:  Appropriate  Cognitive:  Alert, Appropriate, and Oriented  Insight: Appropriate and Good  Engagement in Group:  Engaged  Modes of Intervention:  Discussion  Additional Comments:    Ladona Mow 11/30/2022, 6:02 PM

## 2022-12-01 DIAGNOSIS — F332 Major depressive disorder, recurrent severe without psychotic features: Secondary | ICD-10-CM | POA: Diagnosis not present

## 2022-12-01 LAB — GLUCOSE, CAPILLARY
Glucose-Capillary: 118 mg/dL — ABNORMAL HIGH (ref 70–99)
Glucose-Capillary: 111 mg/dL — ABNORMAL HIGH (ref 70–99)
Glucose-Capillary: 129 mg/dL — ABNORMAL HIGH (ref 70–99)
Glucose-Capillary: 175 mg/dL — ABNORMAL HIGH (ref 70–99)

## 2022-12-01 NOTE — Group Note (Signed)
Date:  12/01/2022 Time:  5:25 PM  Group Topic/Focus:  Overcoming Stress:   The focus of this group is to define stress and help patients assess their triggers. Personal Choices and Values:   The focus of this group is to help patients assess and explore the importance of values in their lives, how their values affect their decisions, how they express their values and what opposes their expression. Self Care:   The focus of this group is to help patients understand the importance of self-care in order to improve or restore emotional, physical, spiritual, interpersonal, and financial health.    Participation Level:  Active  Participation Quality:  Appropriate  Affect:  Flat  Cognitive:  Alert  Insight: Appropriate  Engagement in Group:  Engaged  Modes of Intervention:  Exploration and Socialization  Additional Comments:  na  Nolon Bussing 12/01/2022, 5:25 PM

## 2022-12-01 NOTE — Progress Notes (Signed)
   12/01/22 0710  Psych Admission Type (Psych Patients Only)  Admission Status Voluntary  Psychosocial Assessment  Patient Complaints Anxiety;Depression  Eye Contact Fair  Facial Expression Flat  Affect Appropriate to circumstance  Speech Soft  Interaction Isolative  Motor Activity Slow  Appearance/Hygiene Unremarkable  Behavior Characteristics Cooperative  Mood Depressed;Pleasant;Sad  Thought Process  Coherency WDL  Content WDL  Delusions None reported or observed  Perception WDL  Hallucination None reported or observed  Judgment Impaired  Confusion None  Danger to Self  Current suicidal ideation? Denies  Danger to Others  Danger to Others None reported or observed

## 2022-12-01 NOTE — Progress Notes (Signed)
[facility] MD Progress Note  12/01/2022 11:47 AM Michael [name]  MRN:  [id number] Subjective: Michael [name] is seen on rounds.  Based on his recent lab work his GFR is too low to start his HIV medication, Biktarvy.  Other than that, he has no complaints.  Nurses report no issues.  Also, he is homeless. Principal Problem: Major depressive disorder, recurrent, severe without psychotic features ([place]) Diagnosis: Principal Problem:   Major depressive disorder, recurrent, severe without psychotic features ([place]) Active Problems:   Cocaine use disorder ([name])   MDD (major depressive disorder), recurrent episode, severe ([place])  Total Time spent with patient: 15 minutes  Past Psychiatric History: Depression  Past Medical History:  Past Medical History:  Diagnosis Date   A-fib ([place])    Anemia    Asthma    No PFTs, history of childhood asthma   CAD (coronary artery disease)    Cellulitis 04/2014   left facial   Cellulitis and abscess of toe of right foot 12/08/2019   Chondromalacia of medial femoral condyle    Left knee MRI 04/28/12: Chondromalacia of the medial femoral condyle with slight peripheral degeneration of the meniscocapsular junction of the medial meniscus; followed by sports medicine   Chronic kidney failure, stage 4 (severe) ([place])    Collagen vascular disease ([place])    Crack cocaine use    for 20+ years, has been enrolled in detox programs in the past   Depression    with history of hospitalization for suicidal ideation   Diabetes mellitus 2002   Diagnosed in 2002, started insulin in 2012   Gout    Headache(784.0)    CT head 08/2011: Periventricular and subcortical white matter hypodensities are most in keeping with chronic microangiopathic change   HIV infection ([place]) 08/2011   Followed by [name]   Hyperlipidemia    Hypertension    Pulmonary embolism [facility])     Past Surgical History:  Procedure Laterality Date   AMPUTATION Right 07/21/2019   Procedure: RIGHT SECOND TOE  AMPUTATION;  Surgeon: [name], MD;  Location: [place];  Service: Orthopedics;  Laterality: Right;   BACK SURGERY     1988   BOWEL RESECTION     CARDIAC SURGERY     CERVICAL SPINE SURGERY     " rods in my neck "   CORONARY ARTERY BYPASS GRAFT     CORONARY STENT PLACEMENT     NM MYOCAR PERF WALL MOTION  12/27/2011   normal   SPINE SURGERY     Family History:  Family History  Problem Relation Age of Onset   Diabetes Mother    Hypertension Mother    Hyperlipidemia Mother    Diabetes Father    Cancer Father    Hypertension Father    Diabetes Brother    Heart disease Brother    Diabetes Sister    Colon cancer Neg Hx    Family Psychiatric  History: Unremarkable Social History:  Social History   Substance and Sexual Activity  Alcohol Use No   Alcohol/week: 4.0 standard drinks of alcohol   Types: 2 Cans of beer, 2 Shots of liquor per week     Social History   Substance and Sexual Activity  Drug Use Not Currently   Frequency: 4.0 times per week   Types: "Crack" cocaine, Cocaine   Comment: Recent use of crack    Social History   Socioeconomic History   Marital status: Widowed    Spouse name: Not on file  Number of children: 2   Years of education: 12   Highest education level: 12th grade  Occupational History    Employer: UNEMPLOYED    Comment: 04/2016  Tobacco Use   Smoking status: Never   Smokeless tobacco: Never  Vaping Use   Vaping Use: Never used  Substance and Sexual Activity   Alcohol use: No    Alcohol/week: 4.0 standard drinks of alcohol    Types: 2 Cans of beer, 2 Shots of liquor per week   Drug use: Not Currently    Frequency: 4.0 times per week    Types: "Crack" cocaine, Cocaine    Comment: Recent use of crack   Sexual activity: Yes    Comment: DECLINED CONDOMS  Other Topics Concern   Not on file  Social History Narrative   Currently staying with a friend in [place].  Was staying @ local motel until a few days ago - left b/c of bed bugs.    P[facility] up from [facility].  Not followed by a psychiatrist.   Social Determinants of Health   Financial Resource Strain: Medium Risk (09/16/2018)   Overall Financial Resource Strain (CARDIA)    Difficulty of Paying Living Expenses: Somewhat hard  Food Insecurity: No Food Insecurity (10/22/2022)   Hunger Vital Sign    Worried About Running Out of Food in the Last Year: Never true    [place] in the Last Year: Never true  Recent Concern: [place] Present (09/10/2022)   Hunger Vital Sign    Worried About Running Out of Food in the Last Year: Sometimes true    Ran Out of Food in the Last Year: Sometimes true  Transportation Needs: Unmet Transportation Needs (10/22/2022)   PRAPARE - Hydrologist (Medical): Yes    Lack of Transportation (Non-Medical): Yes  Physical Activity: Unknown (04/22/2019)   Exercise Vital Sign    Days of Exercise per Week: Not on file    Minutes of Exercise per Session: 0 min  Stress: Stress Concern Present (09/16/2018)   [place]    Feeling of Stress : Very much  Social Connections: Socially Isolated (09/16/2018)   Social Connection and Isolation Panel [NHANES]    Frequency of Communication with Friends and Family: Once a week    Frequency of Social Gatherings with Friends and Family: Never    Attends Religious Services: Never    Marine scientist or Organizations: No    Attends Archivist Meetings: Never    Marital Status: Widowed   Additional Social History:                         Sleep: Good  Appetite:  Good  Current Medications: Current Facility-Administered Medications  Medication Dose Route Frequency Provider Last Rate Last Admin   acetaminophen (TYLENOL) tablet 650 mg  650 mg Oral Q6H PRN Merlyn Lot E, NP   650 mg at 11/30/22 0929   albuterol (VENTOLIN HFA) 108 (90 Base) MCG/ACT inhaler 1-2  puff  1-2 puff Inhalation Q6H PRN Mallie Darting, NP       alum & mag hydroxide-simeth (MAALOX/MYLANTA) 200-200-20 MG/5ML suspension 30 mL  30 mL Oral Q4H PRN Merlyn Lot E, NP       atorvastatin (LIPITOR) tablet 40 mg  40 mg Oral Daily Merlyn Lot E, NP   40 mg at 12/01/22 1008  diltiazem (CARDIZEM CD) 24 hr capsule 120 mg  120 mg Oral Daily Merlyn Lot E, NP   120 mg at 12/01/22 1008   DULoxetine (CYMBALTA) DR capsule 20 mg  20 mg Oral Daily Patrecia Pour, NP   20 mg at 12/01/22 1008   fluticasone furoate-vilanterol (BREO ELLIPTA) 200-25 MCG/ACT 1 puff  1 puff Inhalation Daily Merlyn Lot E, NP   1 puff at 12/01/22 1007   gabapentin (NEURONTIN) capsule 100 mg  100 mg Oral TID PRN Patrecia Pour, NP   100 mg at 11/30/22 0930   insulin aspart (novoLOG) injection 0-5 Units  0-5 Units Subcutaneous QHS Merlyn Lot E, NP       insulin aspart (novoLOG) injection 0-9 Units  0-9 Units Subcutaneous TID WC Merlyn Lot E, NP   1 Units at 11/30/22 1645   isosorbide mononitrate (IMDUR) 24 hr tablet 15 mg  15 mg Oral Daily Merlyn Lot E, NP   15 mg at 12/01/22 1008   OLANZapine zydis (ZYPREXA) disintegrating tablet 5 mg  5 mg Oral Q8H PRN Mallie Darting, NP       And   LORazepam (ATIVAN) tablet 1 mg  1 mg Oral PRN Mallie Darting, NP       And   ziprasidone (GEODON) injection 20 mg  20 mg Intramuscular PRN Merlyn Lot E, NP       magnesium hydroxide (MILK OF MAGNESIA) suspension 30 mL  30 mL Oral Daily PRN Merlyn Lot E, NP   30 mL at 11/30/22 [id number]   multivitamin with minerals tablet 1 tablet  1 tablet Oral Daily Parks Ranger, DO   1 tablet at 12/01/22 1008   polyethylene glycol (MIRALAX / GLYCOLAX) packet 17 g  17 g Oral BID Dorothe Pea, RPH   17 g at 12/01/22 1008   traZODone (DESYREL) tablet 50 mg  50 mg Oral QHS Patrecia Pour, NP   50 mg at 11/30/22 2218    Lab Results:  Results for orders placed or performed during the hospital encounter of 11/27/22 (from the  past 48 hour(s))  Glucose, capillary     Status: Abnormal   Collection Time: 11/29/22 11:48 AM  Result Value Ref Range   Glucose-Capillary 164 (H) 70 - 99 mg/dL    Comment: Glucose reference range applies only to samples taken after fasting for at least 8 hours.  Glucose, capillary     Status: Abnormal   Collection Time: 11/29/22  4:11 PM  Result Value Ref Range   Glucose-Capillary 115 (H) 70 - 99 mg/dL    Comment: Glucose reference range applies only to samples taken after fasting for at least 8 hours.  Glucose, capillary     Status: Abnormal   Collection Time: 11/29/22  8:23 PM  Result Value Ref Range   Glucose-Capillary 107 (H) 70 - 99 mg/dL    Comment: Glucose reference range applies only to samples taken after fasting for at least 8 hours.  Glucose, capillary     Status: Abnormal   Collection Time: 11/30/22  7:44 AM  Result Value Ref Range   Glucose-Capillary 105 (H) 70 - 99 mg/dL    Comment: Glucose reference range applies only to samples taken after fasting for at least 8 hours.   Comment 1 Notify RN   Glucose, capillary     Status: Abnormal   Collection Time: 11/30/22 11:38 AM  Result Value Ref Range   Glucose-Capillary 118 (H) 70 - 99 mg/dL  Comment: Glucose reference range applies only to samples taken after fasting for at least 8 hours.  Glucose, capillary     Status: Abnormal   Collection Time: 11/30/22  4:38 PM  Result Value Ref Range   Glucose-Capillary 142 (H) 70 - 99 mg/dL    Comment: Glucose reference range applies only to samples taken after fasting for at least 8 hours.   Comment 1 Notify RN   Glucose, capillary     Status: Abnormal   Collection Time: 11/30/22  9:19 PM  Result Value Ref Range   Glucose-Capillary 134 (H) 70 - 99 mg/dL    Comment: Glucose reference range applies only to samples taken after fasting for at least 8 hours.  Glucose, capillary     Status: Abnormal   Collection Time: 12/01/22  7:28 AM  Result Value Ref Range   Glucose-Capillary  118 (H) 70 - 99 mg/dL    Comment: Glucose reference range applies only to samples taken after fasting for at least 8 hours.  Glucose, capillary     Status: Abnormal   Collection Time: 12/01/22 11:40 AM  Result Value Ref Range   Glucose-Capillary 111 (H) 70 - 99 mg/dL    Comment: Glucose reference range applies only to samples taken after fasting for at least 8 hours.    Blood Alcohol level:  Lab Results  Component Value Date   ETH <10 11/26/2022   ETH <10 [id number]    Metabolic Disorder Labs: Lab Results  Component Value Date   HGBA1C 6.8 (H) 09/30/2022   MPG 148 09/30/2022   MPG 174.29 07/02/2022   No results found for: "PROLACTIN" Lab Results  Component Value Date   CHOL 149 04/25/2021   TRIG 166 (H) 04/25/2021   HDL 57 04/25/2021   CHOLHDL 2.6 04/25/2021   VLDL 27 12/09/2019   LDLCALC 68 04/25/2021   LDLCALC 120 (H) 12/09/2019    Physical Findings: AIMS:  , ,  ,  ,    CIWA:    COWS:     Musculoskeletal: Strength & Muscle Tone: within normal limits Gait & Station: normal Patient leans: N/A  Psychiatric Specialty Exam:  Presentation  General Appearance:  Appropriate for Environment  Eye Contact: Good  Speech: Clear and Coherent  Speech Volume: Normal  Handedness: Right   Mood and Affect  Mood: Euthymic  Affect: Appropriate   Thought Process  Thought Processes: Coherent  Descriptions of Associations:Intact  Orientation:Full (Time, Place and Person)  Thought Content:WDL  History of Schizophrenia/Schizoaffective disorder:No  Duration of Psychotic Symptoms:No data recorded Hallucinations:No data recorded Ideas of Reference:None  Suicidal Thoughts:No data recorded Homicidal Thoughts:No data recorded  Sensorium  Memory: Immediate Fair; Recent Fair  Judgment: Fair  Insight: Fair   Community education officer  Concentration: Fair  Attention Span: Fair  Recall: [place] of  Knowledge: Fair  Language: Good   Psychomotor Activity  Psychomotor Activity:No data recorded  Assets  Assets: Communication Skills   Sleep  Sleep:No data recorded    Blood pressure 132/67, pulse 63, temperature 98.7 F (37.1 C), temperature source Oral, resp. rate 16, height '5\' 8"'$  (1.727 m), weight 67.2 kg, SpO2 100 %. Body mass index is 22.53 kg/m.   Treatment Plan Summary: Daily contact with patient to assess and evaluate symptoms and progress in treatment, Medication management, and Plan continue current medication.  Parks Ranger, DO 12/01/2022, 11:47 AM

## 2022-12-01 NOTE — Progress Notes (Signed)
D: Patient alert and oriented times 4. Pt denies SI, HI, AVH. Pt denies pain. Pt endorses anxiety and depression. No other concerns noted at this time.   A: Pt provided support and encouragement throughout the day. Scheduled medications given as prescribed. Takes medications whole without issue. Breakfast and lunch consumed in dayroom- appetite good.   R: Pt remains safe on the unit with Q15 min safety checks in place. Will continue to monitor for changes.

## 2022-12-01 NOTE — BH Assessment (Signed)
1915 Received patient sitting in the dayroom watching TV he is not currently interacting with anyone.  2015 Patient is alert and oriented x 4, he is currently denying SI/HI, pain, A/V hallucinations. He is endorsing depression and anxiety and is rating both 7/10. Patient reports that he is taking HIV medications and the last time he took them was two weeks ago when he was home. He relays that his medication is at home and there is no one to bring them here.  2200 Patient is medication compliant  and is resting quietly in bed with eyes opened at this time. Will continue  to monitor patient for safety.  0300 Patient has rested quietly in bed, he has remained safe. Will continue to monitor patient for safety.

## 2022-12-02 DIAGNOSIS — F332 Major depressive disorder, recurrent severe without psychotic features: Secondary | ICD-10-CM | POA: Diagnosis not present

## 2022-12-02 LAB — GLUCOSE, CAPILLARY
Glucose-Capillary: 113 mg/dL — ABNORMAL HIGH (ref 70–99)
Glucose-Capillary: 119 mg/dL — ABNORMAL HIGH (ref 70–99)
Glucose-Capillary: 134 mg/dL — ABNORMAL HIGH (ref 70–99)
Glucose-Capillary: 153 mg/dL — ABNORMAL HIGH (ref 70–99)

## 2022-12-02 NOTE — Progress Notes (Signed)
[facility] MD Progress Note  12/02/2022 2:28 PM Emon Faciane  MRN:  [id number] Subjective: Michael [name] is seen on rounds.  He is alert and oriented x 3.  He is very pleasant and cooperative.  He has been taking his medications as prescribed and denies any side effects.  He denies any auditory or visual hallucinations.  Social work is working on follow-up and shelter. Principal Problem: Major depressive disorder, recurrent, severe without psychotic features ([place]) Diagnosis: Principal Problem:   Major depressive disorder, recurrent, severe without psychotic features ([place]) Active Problems:   Cocaine use disorder ([place])   MDD (major depressive disorder), recurrent episode, severe ([place])  Total Time spent with patient: 15 minutes  Past Psychiatric History: Patient is not currently receiving any mental health or substance use services. Patient reports he was discharged from the hospital to [facility] in January, however they discharged him due to his health issues. Patient reports one previous psychiatric hospitalization a few years ago, due to a suicide attempt by cutting.   Past Medical History:  Past Medical History:  Diagnosis Date   A-fib ([place])    Anemia    Asthma    No PFTs, history of childhood asthma   CAD (coronary artery disease)    Cellulitis 04/2014   left facial   Cellulitis and abscess of toe of right foot 12/08/2019   Chondromalacia of medial femoral condyle    Left knee MRI 04/28/12: Chondromalacia of the medial femoral condyle with slight peripheral degeneration of the meniscocapsular junction of the medial meniscus; followed by sports medicine   Chronic kidney failure, stage 4 (severe) ([place])    Collagen vascular disease ([place])    Crack cocaine use    for 20+ years, has been enrolled in detox programs in the past   Depression    with history of hospitalization for suicidal ideation   Diabetes mellitus 2002   Diagnosed in 2002, started insulin in 2012   Gout     Headache(784.0)    CT head 08/2011: Periventricular and subcortical white matter hypodensities are most in keeping with chronic microangiopathic change   HIV infection ([place]) 08/2011   Followed by [name]   Hyperlipidemia    Hypertension    Pulmonary embolism [facility])     Past Surgical History:  Procedure Laterality Date   AMPUTATION Right 07/21/2019   Procedure: RIGHT SECOND TOE AMPUTATION;  Surgeon: [name], MD;  Location: [facility];  Service: Orthopedics;  Laterality: Right;   BACK SURGERY     1988   BOWEL RESECTION     CARDIAC SURGERY     CERVICAL SPINE SURGERY     " rods in my neck "   CORONARY ARTERY BYPASS GRAFT     CORONARY STENT PLACEMENT     NM MYOCAR PERF WALL MOTION  12/27/2011   normal   SPINE SURGERY     Family History:  Family History  Problem Relation Age of Onset   Diabetes Mother    Hypertension Mother    Hyperlipidemia Mother    Diabetes Father    Cancer Father    Hypertension Father    Diabetes Brother    Heart disease Brother    Diabetes Sister    Colon cancer Neg Hx    Family Psychiatric  History: Unremarkable Social History:  Social History   Substance and Sexual Activity  Alcohol Use No   Alcohol/week: 4.0 standard drinks of alcohol   Types: 2 Cans of beer, 2 Shots of  liquor per week     Social History   Substance and Sexual Activity  Drug Use Not Currently   Frequency: 4.0 times per week   Types: "Crack" cocaine, Cocaine   Comment: Recent use of crack    Social History   Socioeconomic History   Marital status: Widowed    Spouse name: Not on file   Number of children: 2   Years of education: 36   Highest education level: 12th grade  Occupational History    Employer: UNEMPLOYED    Comment: 04/2016  Tobacco Use   Smoking status: Never   Smokeless tobacco: Never  Vaping Use   Vaping Use: Never used  Substance and Sexual Activity   Alcohol use: No    Alcohol/week: 4.0 standard drinks of alcohol    Types: 2 Cans of beer, 2  Shots of liquor per week   Drug use: Not Currently    Frequency: 4.0 times per week    Types: "Crack" cocaine, Cocaine    Comment: Recent use of crack   Sexual activity: Yes    Comment: DECLINED CONDOMS  Other Topics Concern   Not on file  Social History Narrative   Currently staying with a friend in [place].  Was staying @ local motel until a few days ago - left b/c of bed bugs.   Picked up from [facility].  Not followed by a psychiatrist.   Social Determinants of Health   Financial Resource Strain: Medium Risk (09/16/2018)   Overall Financial Resource Strain (CARDIA)    Difficulty of Paying Living Expenses: Somewhat hard  Food Insecurity: No Food Insecurity (10/22/2022)   Hunger Vital Sign    Worried About Running Out of Food in the Last Year: Never true    [place] in the Last Year: Never true  Recent Concern: [place] Present (09/10/2022)   Hunger Vital Sign    Worried About Running Out of Food in the Last Year: Sometimes true    Ran Out of Food in the Last Year: Sometimes true  Transportation Needs: Unmet Transportation Needs (10/22/2022)   PRAPARE - Hydrologist (Medical): Yes    Lack of Transportation (Non-Medical): Yes  Physical Activity: Unknown (04/22/2019)   Exercise Vital Sign    Days of Exercise per Week: Not on file    Minutes of Exercise per Session: 0 min  Stress: Stress Concern Present (09/16/2018)   [place]    Feeling of Stress : Very much  Social Connections: Socially Isolated (09/16/2018)   Social Connection and Isolation Panel [NHANES]    Frequency of Communication with Friends and Family: Once a week    Frequency of Social Gatherings with Friends and Family: Never    Attends Religious Services: Never    Marine scientist or Organizations: No    Attends Archivist Meetings: Never    Marital Status: Widowed    Additional Social History:                         Sleep: Good  Appetite:  Good  Current Medications: Current Facility-Administered Medications  Medication Dose Route Frequency Provider Last Rate Last Admin   acetaminophen (TYLENOL) tablet 650 mg  650 mg Oral Q6H PRN Merlyn Lot E, NP   650 mg at 11/30/22 0929   albuterol (VENTOLIN HFA) 108 (90 Base) MCG/ACT inhaler 1-2 puff  1-2 puff Inhalation Q6H PRN Mallie Darting, NP       alum & mag hydroxide-simeth (MAALOX/MYLANTA) 200-200-20 MG/5ML suspension 30 mL  30 mL Oral Q4H PRN Mallie Darting, NP       atorvastatin (LIPITOR) tablet 40 mg  40 mg Oral Daily Merlyn Lot E, NP   40 mg at 12/02/22 0901   diltiazem (CARDIZEM CD) 24 hr capsule 120 mg  120 mg Oral Daily Merlyn Lot E, NP   120 mg at 12/02/22 0957   DULoxetine (CYMBALTA) DR capsule 20 mg  20 mg Oral Daily Patrecia Pour, NP   20 mg at 12/02/22 0957   fluticasone furoate-vilanterol (BREO ELLIPTA) 200-25 MCG/ACT 1 puff  1 puff Inhalation Daily Merlyn Lot E, NP   1 puff at 12/02/22 0804   gabapentin (NEURONTIN) capsule 100 mg  100 mg Oral TID PRN Patrecia Pour, NP   100 mg at 12/01/22 2112   insulin aspart (novoLOG) injection 0-5 Units  0-5 Units Subcutaneous QHS Merlyn Lot E, NP       insulin aspart (novoLOG) injection 0-9 Units  0-9 Units Subcutaneous TID WC Merlyn Lot E, NP   1 Units at 12/01/22 1656   isosorbide mononitrate (IMDUR) 24 hr tablet 15 mg  15 mg Oral Daily Merlyn Lot E, NP   15 mg at 12/02/22 0901   OLANZapine zydis (ZYPREXA) disintegrating tablet 5 mg  5 mg Oral Q8H PRN Mallie Darting, NP       And   LORazepam (ATIVAN) tablet 1 mg  1 mg Oral PRN Mallie Darting, NP       And   ziprasidone (GEODON) injection 20 mg  20 mg Intramuscular PRN Merlyn Lot E, NP       magnesium hydroxide (MILK OF MAGNESIA) suspension 30 mL  30 mL Oral Daily PRN Merlyn Lot E, NP   30 mL at 11/30/22 [id number]   multivitamin with minerals tablet 1 tablet  1  tablet Oral Daily Parks Ranger, DO   1 tablet at 12/02/22 0901   polyethylene glycol (MIRALAX / GLYCOLAX) packet 17 g  17 g Oral BID Dorothe Pea, RPH   17 g at 12/02/22 0901   traZODone (DESYREL) tablet 50 mg  50 mg Oral QHS Patrecia Pour, NP   50 mg at 12/01/22 2112    Lab Results:  Results for orders placed or performed during the hospital encounter of 11/27/22 (from the past 48 hour(s))  Glucose, capillary     Status: Abnormal   Collection Time: 11/30/22  4:38 PM  Result Value Ref Range   Glucose-Capillary 142 (H) 70 - 99 mg/dL    Comment: Glucose reference range applies only to samples taken after fasting for at least 8 hours.   Comment 1 Notify RN   Glucose, capillary     Status: Abnormal   Collection Time: 11/30/22  9:19 PM  Result Value Ref Range   Glucose-Capillary 134 (H) 70 - 99 mg/dL    Comment: Glucose reference range applies only to samples taken after fasting for at least 8 hours.  Glucose, capillary     Status: Abnormal   Collection Time: 12/01/22  7:28 AM  Result Value Ref Range   Glucose-Capillary 118 (H) 70 - 99 mg/dL    Comment: Glucose reference range applies only to samples taken after fasting for at least 8 hours.  Glucose, capillary     Status: Abnormal   Collection Time: 12/01/22 11:40 AM  Result Value Ref Range   Glucose-Capillary 111 (H) 70 - 99 mg/dL    Comment: Glucose reference range applies only to samples taken after fasting for at least 8 hours.  Glucose, capillary     Status: Abnormal   Collection Time: 12/01/22  4:25 PM  Result Value Ref Range   Glucose-Capillary 129 (H) 70 - 99 mg/dL    Comment: Glucose reference range applies only to samples taken after fasting for at least 8 hours.  Glucose, capillary     Status: Abnormal   Collection Time: 12/01/22  8:27 PM  Result Value Ref Range   Glucose-Capillary 175 (H) 70 - 99 mg/dL    Comment: Glucose reference range applies only to samples taken after fasting for at least 8 hours.   Glucose, capillary     Status: Abnormal   Collection Time: 12/02/22  7:41 AM  Result Value Ref Range   Glucose-Capillary 113 (H) 70 - 99 mg/dL    Comment: Glucose reference range applies only to samples taken after fasting for at least 8 hours.  Glucose, capillary     Status: Abnormal   Collection Time: 12/02/22 11:47 AM  Result Value Ref Range   Glucose-Capillary 119 (H) 70 - 99 mg/dL    Comment: Glucose reference range applies only to samples taken after fasting for at least 8 hours.    Blood Alcohol level:  Lab Results  Component Value Date   ETH <10 11/26/2022   ETH <10 [id number]    Metabolic Disorder Labs: Lab Results  Component Value Date   HGBA1C 6.8 (H) 09/30/2022   MPG 148 09/30/2022   MPG 174.29 07/02/2022   No results found for: "PROLACTIN" Lab Results  Component Value Date   CHOL 149 04/25/2021   TRIG 166 (H) 04/25/2021   HDL 57 04/25/2021   CHOLHDL 2.6 04/25/2021   VLDL 27 12/09/2019   LDLCALC 68 04/25/2021   LDLCALC 120 (H) 12/09/2019    Physical Findings: AIMS:  , ,  ,  ,    CIWA:    COWS:     Musculoskeletal: Strength & Muscle Tone: within normal limits Gait & Station: normal Patient leans: N/A  Psychiatric Specialty Exam:  Presentation  General Appearance:  Appropriate for Environment  Eye Contact: Good  Speech: Clear and Coherent  Speech Volume: Normal  Handedness: Right   Mood and Affect  Mood: Euthymic  Affect: Appropriate   Thought Process  Thought Processes: Coherent  Descriptions of Associations:Intact  Orientation:Full (Time, Place and Person)  Thought Content:WDL  History of Schizophrenia/Schizoaffective disorder:No  Duration of Psychotic Symptoms:No data recorded Hallucinations:No data recorded Ideas of Reference:None  Suicidal Thoughts:No data recorded Homicidal Thoughts:No data recorded  Sensorium  Memory: Immediate Fair; Recent Fair  Judgment: Fair  Insight: Fair   Chemical engineer  Concentration: Fair  Attention Span: Fair  Recall: [employer] of Knowledge: Fair  Language: Good   Psychomotor Activity  Psychomotor Activity:No data recorded  Assets  Assets: Communication Skills   Sleep  Sleep:No data recorded   Physical Exam: Physical Exam Vitals and nursing note reviewed.  Constitutional:      Appearance: Normal appearance. He is normal weight.  Neurological:     General: No focal deficit present.     Mental Status: He is alert and oriented to person, place, and time.  Psychiatric:        Attention and Perception: Attention and perception normal.        Mood and Affect: Mood and  affect normal.        Speech: Speech normal.        Behavior: Behavior normal. Behavior is cooperative.        Thought Content: Thought content normal.        Cognition and Memory: Cognition and memory normal.        Judgment: Judgment normal.    Review of Systems  Constitutional: Negative.   HENT: Negative.    Eyes: Negative.   Respiratory: Negative.    Cardiovascular: Negative.   Gastrointestinal: Negative.   Genitourinary: Negative.   Musculoskeletal: Negative.   Skin: Negative.   Neurological: Negative.   Endo/Heme/Allergies: Negative.   Psychiatric/Behavioral: Negative.     Blood pressure (!) 157/96, pulse (!) 51, temperature 98.5 F (36.9 C), temperature source Oral, resp. rate 20, height '5\' 8"'$  (1.727 m), weight 67.2 kg, SpO2 100 %. Body mass index is 22.53 kg/m.   Treatment Plan Summary: Daily contact with patient to assess and evaluate symptoms and progress in treatment, Medication management, and Plan continue current medications.  Parks Ranger, DO 12/02/2022, 2:28 PM

## 2022-12-02 NOTE — Progress Notes (Signed)
Mr Dameyon has been compliant with medications denies SI/HI/A/VH and verbally contracted for safety continue to endorse anxiety and depression with worrying about getting discharged. Patient interacting well with Peers and staff ambulating with front wheel walker. All fall protocol in place. Support and encouragement provided.

## 2022-12-02 NOTE — Group Note (Signed)
Date:  12/02/2022 Time:  1:55 AM  Group Topic/Focus:  Healthy Communication:   The focus of this group is to discuss communication, barriers to communication, as well as healthy ways to communicate with others.    Participation Level:  Active  Participation Quality:  Attentive  Affect:  Excited  Cognitive:  Alert  Insight: Appropriate  Engagement in Group:  Engaged  Modes of Intervention:  Support  Additional Comments:    Bradd Canary 12/02/2022, 1:55 AM

## 2022-12-02 NOTE — Group Note (Signed)
Recreation Therapy Group Note   Group Topic:Communication  Group Date: 12/02/2022 Start Time: 1400 End Time: 1450 Facilitators: Vilma Prader, LRT, CTRS Location: Courtyard  Group Description: [employer]. LRT and NT brought pts outside to the courtyard to get fresh air and sunlight. During the time outside, we tossed around a beach ball that has many different prompts and questions on it while listening to music. After playing, pts had the option to go back inside, to walk around the courtyard, or sit and listen to music.   Affect/Mood: Appropriate and Flat   Participation Level: Moderate   Participation Quality: Independent   Behavior: Appropriate   Speech/Thought Process: Coherent   Insight: Fair   Judgement: Good   Modes of Intervention: Activity   Patient Response to Interventions:  Interested  and Receptive   Education Outcome:  Acknowledges education   Clinical Observations/Individualized Feedback: Michael [name] was active in their participation of session activities and group discussion. Pt identified "falling" is his biggest fear, which was a question on the beach ball. Pt sat in the back and initially did not interact with peers or partake in passing the ball around. NT and LRT passed the ball to pt and had pt throw the ball to peer once he was done answering the question. Pt was pleasant and thanked LRT and NT.    Plan: Continue to engage patient in RT group sessions 2-3x/week.   [address], LRT, [place] 12/02/2022 2:57 PM

## 2022-12-02 NOTE — Group Note (Signed)
Date:  12/02/2022 Time:  11:01 AM  Group Topic/Focus:  Overcoming Stress:   The focus of this group is to define stress and help patients assess their triggers. Self Care:   The focus of this group is to help patients understand the importance of self-care in order to improve or restore emotional, physical, spiritual, interpersonal, and financial health.    Participation Level:  Active  Participation Quality:  Appropriate  Affect:  Appropriate  Cognitive:  Alert  Insight: Appropriate  Engagement in Group:  Engaged  Modes of Intervention:  Activity and Socialization  Additional Comments:  Bingo  Michael [name] l Michael [name] 12/02/2022, 11:01 AM

## 2022-12-02 NOTE — Progress Notes (Signed)
D- Patient alert and oriented x 4. Patient presents with a pleasant mood and affect. Patient smiling and laughing with this Probation officer.  Patient denies SI, HI, AVH, and pain. Patient complaining of chest discomfort while laying flat. This Probation officer educated patient on sitting up a little. Patient refused PRN albuterol. This writer will continue to closely monitor patients report of chest pain. Patient verbalized to this Probation officer that he will inform writer if pain persist or worsen.   A- Scheduled medications administered to patient, per MD orders. Support and encouragement provided.  Routine safety checks conducted every 15 minutes.  Patient informed to notify staff with problems or concerns.  R- No adverse drug reactions noted. Patient contracts for safety at this time. Patient compliant with medications and treatment plan. Patient receptive, calm, and cooperative.Patient participated in group. Patient interacts well with others on the unit. Patient participated in group. Patient remains safe at this time.   12/02/22 0901  Charting Type  Charting Type Shift assessment  Safety Check Verification  Has the RN verified the 15 minute safety check completion? Yes  Neurological  Neuro (WDL) WDL  Orientation Level Oriented X4  Cognition Appropriate at baseline  Speech Clear  Neuro Symptoms None  HEENT  HEENT (WDL) WDL  Teeth Poor dental hygiene  Voice Clear  Lips Symmetrical  Respiratory  Respiratory (WDL) X (patient reports SOB prior to medication)  Cough None  Respiratory Pattern Regular;Unlabored  Chest Assessment Chest expansion symmetrical  Cardiac  Cardiac (WDL) WDL  Vascular  Vascular (WDL) X  Integumentary  Integumentary (WDL) X  Skin Color Appropriate for ethnicity  Braden Scale (Ages 8 and up)  Sensory Perceptions 4  Moisture 4  Activity 3  Mobility 3  Nutrition 3  Friction and Shear 3  Braden Scale Score 20  Musculoskeletal  Musculoskeletal (WDL) X  Assistive Device Front  wheel walker  Weight Bearing Restrictions No  Gastrointestinal  Gastrointestinal (WDL) WDL  Last BM Date  12/01/22  GU Assessment  Genitourinary (WDL) WDL  Neurological  Level of Consciousness Alert

## 2022-12-02 NOTE — Progress Notes (Signed)
   12/01/22 2100  Psych Admission Type (Psych Patients Only)  Admission Status Voluntary  Psychosocial Assessment  Patient Complaints Anxiety;Depression  Eye Contact Fair  Facial Expression Flat  Affect Appropriate to circumstance  Speech Soft  Interaction Isolative  Motor Activity Slow  Appearance/Hygiene Unremarkable  Behavior Characteristics Cooperative  Mood Sad  Thought Process  Coherency WDL  Content WDL  Delusions None reported or observed  Perception WDL  Hallucination None reported or observed  Judgment Impaired  Confusion None  Danger to Self  Current suicidal ideation? Denies  Agreement Not to Harm Self No  Description of Agreement verbal  Danger to Others  Danger to Others None reported or observed   Patient compliant with treatment took hs medications without any problems no adverse effects noted. Interacting well with Public relations account executive. Denies SI/HI/A/VH at present and verbally contracted for safety. Patient ambulating well with front wheel walker and all fall protocol in place. Support and enocuragement Provided.

## 2022-12-03 DIAGNOSIS — F332 Major depressive disorder, recurrent severe without psychotic features: Secondary | ICD-10-CM | POA: Diagnosis not present

## 2022-12-03 LAB — GLUCOSE, CAPILLARY
Glucose-Capillary: 108 mg/dL — ABNORMAL HIGH (ref 70–99)
Glucose-Capillary: 133 mg/dL — ABNORMAL HIGH (ref 70–99)
Glucose-Capillary: 204 mg/dL — ABNORMAL HIGH (ref 70–99)
Glucose-Capillary: 145 mg/dL — ABNORMAL HIGH (ref 70–99)

## 2022-12-03 MED ORDER — QUETIAPINE FUMARATE 25 MG PO TABS
50.0000 mg | ORAL_TABLET | Freq: Every day | ORAL | Status: DC
  Administered 2022-12-03 – 2022-12-08 (×6): 50 mg via ORAL
  Filled 2022-12-03 (×6): qty 2

## 2022-12-03 NOTE — Progress Notes (Signed)
Patient encouraged to drink fluids, HOB elevated and PRN albuterol administered due to patient complaint of chest pain and breathing. Patient reassessed by this writer and pulse increased to 62.

## 2022-12-03 NOTE — BHH Counselor (Signed)
CSW met with pt to assist with housing resources and continue to establish appropriate discharge plans.   CSW and pt contacted the listed facilities on boarding house list resource.   Most housing options were full or too expensive for pt.   [facility] Call[facility], [phone] stated opening may be available Friday. Pt will follow up then.   Pt states he will have more funds on the 3rd. He said he is open to staying at hotels at discharge and will continue to seek more permanent housing in the future.   Erikah Thumm [place], MSW, LCSW-A 2/27/202411:32 AM

## 2022-12-03 NOTE — Group Note (Signed)
Date:  12/03/2022 Time:  4:52 PM  Group Topic/Focus:  Goals Group:   The focus of this group is to help patients establish daily goals to achieve during treatment and discuss how the patient can incorporate goal setting into their daily lives to aide in recovery.    Participation Level:  Active  Participation Quality:  Appropriate and Attentive  Affect:  Appropriate  Cognitive:  Alert and Appropriate  Insight: Appropriate and Good  Engagement in Group:  Engaged  Modes of Intervention:  Activity and Discussion  Additional Comments:  His goal was to learn more about this program and what we offer.  Ladona Mow 12/03/2022, 4:52 PM

## 2022-12-03 NOTE — Progress Notes (Signed)
D- Patient alert and oriented x 4. Patient presents with a pleasant mood and affect. Patient d denies SI, HI, and AVH. Patient complaining of chest pain but states it is his normal feeling when not in the hospital. Writer will continue to closely monitor patient for signs of increased chest pain/discomfort or distress.  MD made aware.  A- Scheduled medications administered to patient, per MD orders. Support and encouragement provided.  Routine safety checks conducted every 15 minutes.  Patient informed to notify staff with problems or concerns.  R- No adverse drug reactions noted. Patient contracts for safety at this time. Patient compliant with medications and treatment plan. Patient receptive, calm, and cooperative. Patient interacts well with others on the unit. Patient participated in group. Patient remains safe at this time.   12/03/22 0826  Charting Type  Charting Type Shift assessment  Safety Check Verification  Has the RN verified the 15 minute safety check completion? Yes  Neurological  Neuro (WDL) WDL  Orientation Level Oriented X4  Cognition Appropriate at baseline  Speech Clear  HEENT  HEENT (WDL) X  Teeth Poor dental hygiene  Voice Clear  Lips Symmetrical  Respiratory  Respiratory (WDL) WDL  Cough None  Respiratory Pattern Regular;Unlabored  Chest Assessment Chest expansion symmetrical  Cardiac  Cardiac (WDL) X  Pulse Irregular  Vascular  Vascular (WDL) X  RUE Neurovascular Assessment  R Radial Pulse +1  LUE Neurovascular Assessment  L Radial Pulse +2  Integumentary  Integumentary (WDL) X  Skin Color Appropriate for ethnicity  Braden Scale (Ages 8 and up)  Sensory Perceptions 4  Moisture 4  Activity 3  Mobility 3  Nutrition 3  Friction and Shear 3  Braden Scale Score 20  Musculoskeletal  Musculoskeletal (WDL) X  Assistive Device Front wheel walker  Gastrointestinal  Gastrointestinal (WDL) WDL  Last BM Date  12/02/22  GU Assessment  Genitourinary (WDL)  WDL  Neurological  Level of Consciousness Alert

## 2022-12-03 NOTE — Progress Notes (Signed)
Patient pulse low, 37 with dinamap and 48 with manual check by this Probation officer. MD made aware.

## 2022-12-03 NOTE — Group Note (Signed)
Recreation Therapy Group Note   Group Topic:Healthy Support Systems  Group Date: 12/03/2022 Start Time: 1400 End Time: 1455 Facilitators: Vilma Prader, LRT, CTRS Location:  Dayroom  Group Description: Straw Bridge. Individually, patients were given 10 plastic drinking straws and an equal length of masking tape. Using the materials provided, patients were instructed to build a free-standing bridge-like structure to suspend an everyday item (ex: puzzle box) off the floor or table surface. All materials were required to be used in Conservation officer, nature. LRT facilitated post-activity discussion reviewing how we, humans, are like the structure we built; when things get too heavy in our life and we do not have adequate supports/coping skills, then we will fall just like the straw-built structure will. LRT focused on how having a "base" or structure on the bottom was necessary for the object to stand, meaning we must be secure and stable first before building on ourselves or others. Patients were encouraged to reflect how the skills used in this activity can be generalized to daily life post discharge.  Affect/Mood: Appropriate and Flat   Participation Level: Active and Engaged   Participation Quality: Independent   Behavior: Appropriate and Calm   Speech/Thought Process: Coherent   Insight: Fair   Judgement: Fair    Modes of Intervention: Activity   Patient Response to Interventions:  Engaged and Receptive   Education Outcome:  Acknowledges education   Clinical Observations/Individualized Feedback: Michael [name] was active in their participation of session activities and group discussion. Pt chose to work independently on the activity. Pt was able to complete activity and use all materials to make a structure. Pt had a flat affect duration of group, however brightened when LRT or peers would interact with him.  Plan: Continue to engage patient in RT group sessions 2-3x/week.   [address], LRT,  CTRS 12/03/2022 3:01 PM

## 2022-12-03 NOTE — Progress Notes (Signed)
[facility] MD Progress Note  12/03/2022 12:56 PM Michael [name]  MRN:  [id number] Subjective: Michael [name] is seen on rounds.  He says that he is doing better.  Nurses report that his blood pressure and pulse been up and down.  He does have chronic cardiac problems.  Because of this and going to discontinue his trazodone and start him on some Seroquel for sleep. Principal Problem: Major depressive disorder, recurrent, severe without psychotic features ([place]) Diagnosis: Principal Problem:   Major depressive disorder, recurrent, severe without psychotic features ([place]) Active Problems:   Cocaine use disorder ([place])   MDD (major depressive disorder), recurrent episode, severe ([place])  Total Time spent with patient: 15 minutes  Past Psychiatric History:  Patient is not currently receiving any mental health or substance use services. Patient reports he was discharged from the hospital to [facility] in January, however they discharged him due to his health issues. Patient reports one previous psychiatric hospitalization a few years ago, due to a suicide attempt by cutting.    Past Medical History:  Past Medical History:  Diagnosis Date   A-fib ([place])    Anemia    Asthma    No PFTs, history of childhood asthma   CAD (coronary artery disease)    Cellulitis 04/2014   left facial   Cellulitis and abscess of toe of right foot 12/08/2019   Chondromalacia of medial femoral condyle    Left knee MRI 04/28/12: Chondromalacia of the medial femoral condyle with slight peripheral degeneration of the meniscocapsular junction of the medial meniscus; followed by sports medicine   Chronic kidney failure, stage 4 (severe) ([place])    Collagen vascular disease ([place])    Crack cocaine use    for 20+ years, has been enrolled in detox programs in the past   Depression    with history of hospitalization for suicidal ideation   Diabetes mellitus 2002   Diagnosed in 2002, started insulin in 2012   Gout     Headache(784.0)    CT head 08/2011: Periventricular and subcortical white matter hypodensities are most in keeping with chronic microangiopathic change   HIV infection ([place]) 08/2011   Followed by [name]   Hyperlipidemia    Hypertension    Pulmonary embolism [facility])     Past Surgical History:  Procedure Laterality Date   AMPUTATION Right 07/21/2019   Procedure: RIGHT SECOND TOE AMPUTATION;  Surgeon: [name], MD;  Location: [place];  Service: Orthopedics;  Laterality: Right;   BACK SURGERY     1988   BOWEL RESECTION     CARDIAC SURGERY     CERVICAL SPINE SURGERY     " rods in my neck "   CORONARY ARTERY BYPASS GRAFT     CORONARY STENT PLACEMENT     NM MYOCAR PERF WALL MOTION  12/27/2011   normal   SPINE SURGERY     Family History:  Family History  Problem Relation Age of Onset   Diabetes Mother    Hypertension Mother    Hyperlipidemia Mother    Diabetes Father    Cancer Father    Hypertension Father    Diabetes Brother    Heart disease Brother    Diabetes Sister    Colon cancer Neg Hx    Family Psychiatric  History: Unremarkable Social History:  Social History   Substance and Sexual Activity  Alcohol Use No   Alcohol/week: 4.0 standard drinks of alcohol   Types: 2 Cans of beer, 2  Shots of liquor per week     Social History   Substance and Sexual Activity  Drug Use Not Currently   Frequency: 4.0 times per week   Types: "Crack" cocaine, Cocaine   Comment: Recent use of crack    Social History   Socioeconomic History   Marital status: Widowed    Spouse name: Not on file   Number of children: 2   Years of education: 17   Highest education level: 12th grade  Occupational History    Employer: UNEMPLOYED    Comment: 04/2016  Tobacco Use   Smoking status: Never   Smokeless tobacco: Never  Vaping Use   Vaping Use: Never used  Substance and Sexual Activity   Alcohol use: No    Alcohol/week: 4.0 standard drinks of alcohol    Types: 2 Cans of beer, 2  Shots of liquor per week   Drug use: Not Currently    Frequency: 4.0 times per week    Types: "Crack" cocaine, Cocaine    Comment: Recent use of crack   Sexual activity: Yes    Comment: DECLINED CONDOMS  Other Topics Concern   Not on file  Social History Narrative   Currently staying with a friend in [place].  Was staying @ local motel until a few days ago - left b/c of bed bugs.   Picked up from [facility].  Not followed by a psychiatrist.   Social Determinants of Health   Financial Resource Strain: Medium Risk (09/16/2018)   Overall Financial Resource Strain (CARDIA)    Difficulty of Paying Living Expenses: Somewhat hard  Food Insecurity: No Food Insecurity (10/22/2022)   Hunger Vital Sign    Worried About Running Out of Food in the Last Year: Never true    [place] in the Last Year: Never true  Recent Concern: [place] Present (09/10/2022)   Hunger Vital Sign    Worried About Running Out of Food in the Last Year: Sometimes true    Ran Out of Food in the Last Year: Sometimes true  Transportation Needs: Unmet Transportation Needs (10/22/2022)   PRAPARE - Hydrologist (Medical): Yes    Lack of Transportation (Non-Medical): Yes  Physical Activity: Unknown (04/22/2019)   Exercise Vital Sign    Days of Exercise per Week: Not on file    Minutes of Exercise per Session: 0 min  Stress: Stress Concern Present (09/16/2018)   [place]    Feeling of Stress : Very much  Social Connections: Socially Isolated (09/16/2018)   Social Connection and Isolation Panel [NHANES]    Frequency of Communication with Friends and Family: Once a week    Frequency of Social Gatherings with Friends and Family: Never    Attends Religious Services: Never    Marine scientist or Organizations: No    Attends Archivist Meetings: Never    Marital Status: Widowed    Additional Social History:                         Sleep: Good  Appetite:  Good  Current Medications: Current Facility-Administered Medications  Medication Dose Route Frequency Provider Last Rate Last Admin   acetaminophen (TYLENOL) tablet 650 mg  650 mg Oral Q6H PRN Merlyn Lot E, NP   650 mg at 11/30/22 0929   albuterol (VENTOLIN HFA) 108 (90 Base) MCG/ACT inhaler  1-2 puff  1-2 puff Inhalation Q6H PRN Merlyn Lot E, NP   2 puff at 12/03/22 0918   alum & mag hydroxide-simeth (MAALOX/MYLANTA) 200-200-20 MG/5ML suspension 30 mL  30 mL Oral Q4H PRN Mallie Darting, NP       atorvastatin (LIPITOR) tablet 40 mg  40 mg Oral Daily Merlyn Lot E, NP   40 mg at 12/03/22 [id number]   diltiazem (CARDIZEM CD) 24 hr capsule 120 mg  120 mg Oral Daily Merlyn Lot E, NP   120 mg at 12/03/22 0904   DULoxetine (CYMBALTA) DR capsule 20 mg  20 mg Oral Daily Patrecia Pour, NP   20 mg at 12/03/22 0903   fluticasone furoate-vilanterol (BREO ELLIPTA) 200-25 MCG/ACT 1 puff  1 puff Inhalation Daily Merlyn Lot E, NP   1 puff at 12/03/22 0859   gabapentin (NEURONTIN) capsule 100 mg  100 mg Oral TID PRN Patrecia Pour, NP   100 mg at 12/02/22 2104   insulin aspart (novoLOG) injection 0-5 Units  0-5 Units Subcutaneous QHS Merlyn Lot E, NP       insulin aspart (novoLOG) injection 0-9 Units  0-9 Units Subcutaneous TID WC Merlyn Lot E, NP   1 Units at 12/03/22 1215   isosorbide mononitrate (IMDUR) 24 hr tablet 15 mg  15 mg Oral Daily Merlyn Lot E, NP   15 mg at 12/03/22 0903   OLANZapine zydis (ZYPREXA) disintegrating tablet 5 mg  5 mg Oral Q8H PRN Mallie Darting, NP       And   LORazepam (ATIVAN) tablet 1 mg  1 mg Oral PRN Mallie Darting, NP       And   ziprasidone (GEODON) injection 20 mg  20 mg Intramuscular PRN Merlyn Lot E, NP       magnesium hydroxide (MILK OF MAGNESIA) suspension 30 mL  30 mL Oral Daily PRN Merlyn Lot E, NP   30 mL at 11/30/22 [id number]   multivitamin with  minerals tablet 1 tablet  1 tablet Oral Daily Parks Ranger, DO   1 tablet at 12/03/22 [id number]   polyethylene glycol (MIRALAX / GLYCOLAX) packet 17 g  17 g Oral BID Dorothe Pea, RPH   17 g at 12/03/22 [id number]   QUEtiapine (SEROQUEL) tablet 50 mg  50 mg Oral QHS Parks Ranger, DO        Lab Results:  Results for orders placed or performed during the hospital encounter of 11/27/22 (from the past 48 hour(s))  Glucose, capillary     Status: Abnormal   Collection Time: 12/01/22  4:25 PM  Result Value Ref Range   Glucose-Capillary 129 (H) 70 - 99 mg/dL    Comment: Glucose reference range applies only to samples taken after fasting for at least 8 hours.  Glucose, capillary     Status: Abnormal   Collection Time: 12/01/22  8:27 PM  Result Value Ref Range   Glucose-Capillary 175 (H) 70 - 99 mg/dL    Comment: Glucose reference range applies only to samples taken after fasting for at least 8 hours.  Glucose, capillary     Status: Abnormal   Collection Time: 12/02/22  7:41 AM  Result Value Ref Range   Glucose-Capillary 113 (H) 70 - 99 mg/dL    Comment: Glucose reference range applies only to samples taken after fasting for at least 8 hours.  Glucose, capillary     Status: Abnormal   Collection Time: 12/02/22 11:47 AM  Result Value Ref  Range   Glucose-Capillary 119 (H) 70 - 99 mg/dL    Comment: Glucose reference range applies only to samples taken after fasting for at least 8 hours.  Glucose, capillary     Status: Abnormal   Collection Time: 12/02/22  4:22 PM  Result Value Ref Range   Glucose-Capillary 134 (H) 70 - 99 mg/dL    Comment: Glucose reference range applies only to samples taken after fasting for at least 8 hours.  Glucose, capillary     Status: Abnormal   Collection Time: 12/02/22  8:16 PM  Result Value Ref Range   Glucose-Capillary 153 (H) 70 - 99 mg/dL    Comment: Glucose reference range applies only to samples taken after fasting for at least 8 hours.    Comment 1 Notify RN   Glucose, capillary     Status: Abnormal   Collection Time: 12/03/22  7:53 AM  Result Value Ref Range   Glucose-Capillary 108 (H) 70 - 99 mg/dL    Comment: Glucose reference range applies only to samples taken after fasting for at least 8 hours.  Glucose, capillary     Status: Abnormal   Collection Time: 12/03/22 11:36 AM  Result Value Ref Range   Glucose-Capillary 133 (H) 70 - 99 mg/dL    Comment: Glucose reference range applies only to samples taken after fasting for at least 8 hours.    Blood Alcohol level:  Lab Results  Component Value Date   ETH <10 11/26/2022   ETH <10 [id number]    Metabolic Disorder Labs: Lab Results  Component Value Date   HGBA1C 6.8 (H) 09/30/2022   MPG 148 09/30/2022   MPG 174.29 07/02/2022   No results found for: "PROLACTIN" Lab Results  Component Value Date   CHOL 149 04/25/2021   TRIG 166 (H) 04/25/2021   HDL 57 04/25/2021   CHOLHDL 2.6 04/25/2021   VLDL 27 12/09/2019   LDLCALC 68 04/25/2021   LDLCALC 120 (H) 12/09/2019    Physical Findings: AIMS:  , ,  ,  ,    CIWA:    COWS:     Musculoskeletal: Strength & Muscle Tone: within normal limits Gait & Station: normal Patient leans: N/A  Psychiatric Specialty Exam:  Presentation  General Appearance:  Appropriate for Environment  Eye Contact: Good  Speech: Clear and Coherent  Speech Volume: Normal  Handedness: Right   Mood and Affect  Mood: Euthymic  Affect: Appropriate   Thought Process  Thought Processes: Coherent  Descriptions of Associations:Intact  Orientation:Full (Time, Place and Person)  Thought Content:WDL  History of Schizophrenia/Schizoaffective disorder:No  Duration of Psychotic Symptoms:No data recorded Hallucinations:No data recorded Ideas of Reference:None  Suicidal Thoughts:No data recorded Homicidal Thoughts:No data recorded  Sensorium  Memory: Immediate Fair; Recent  Fair  Judgment: Fair  Insight: Fair   Community education officer  Concentration: Fair  Attention Span: Fair  Recall: [employer] of Knowledge: Fair  Language: Good   Psychomotor Activity  Psychomotor Activity:No data recorded  Assets  Assets: Communication Skills   Sleep  Sleep:No data recorded   Physical Exam: Physical Exam Vitals and nursing note reviewed.  Constitutional:      Appearance: Normal appearance. He is normal weight.  Neurological:     General: No focal deficit present.     Mental Status: He is alert and oriented to person, place, and time.  Psychiatric:        Attention and Perception: Attention and perception normal.        Mood  and Affect: Mood and affect normal.        Speech: Speech normal.        Behavior: Behavior normal. Behavior is cooperative.        Thought Content: Thought content normal.        Cognition and Memory: Cognition and memory normal.        Judgment: Judgment normal.    Review of Systems  Constitutional: Negative.   HENT: Negative.    Eyes: Negative.   Respiratory: Negative.    Cardiovascular: Negative.   Gastrointestinal: Negative.   Genitourinary: Negative.   Musculoskeletal: Negative.   Skin: Negative.   Neurological: Negative.   Endo/Heme/Allergies: Negative.   Psychiatric/Behavioral: Negative.     Blood pressure (!) 156/74, pulse 62, temperature 98.4 F (36.9 C), temperature source Oral, resp. rate 18, height '5\' 8"'$  (1.727 m), weight 67.2 kg, SpO2 100 %. Body mass index is 22.53 kg/m.   Treatment Plan Summary: Daily contact with patient to assess and evaluate symptoms and progress in treatment, Medication management, and Plan discontinue trazodone and start Seroquel at bedtime.  Parks Ranger, DO 12/03/2022, 12:56 PM

## 2022-12-03 NOTE — Group Note (Signed)
[place] LCSW Group Therapy Note   Group Date: 12/03/2022 Start Time: 1300 End Time: 1400   Type of Therapy/Topic:  Group Therapy:  Emotion Regulation  Participation Level:  Minimal     Description of Group:    The purpose of this group is to assist patients in learning to regulate negative emotions and experience positive emotions. Patients will be guided to discuss ways in which they have been vulnerable to their negative emotions. These vulnerabilities will be juxtaposed with experiences of positive emotions or situations, and patients challenged to use positive emotions to combat negative ones. Special emphasis will be placed on coping with negative emotions in conflict situations, and patients will process healthy conflict resolution skills.  Therapeutic Goals: Patient will identify two positive emotions or experiences to reflect on in order to balance out negative emotions:  Patient will label two or more emotions that they find the most difficult to experience:  Patient will be able to demonstrate positive conflict resolution skills through discussion or role plays:   Summary of Patient Progress:   Patient was present for the entirety of the group session. Patient was an active listener and participated in the topic of discussion, provided helpful advice to others, and added nuance to topic of conversation. He said he felt calm doing the breathing activity. Patient had a low mood.     Therapeutic Modalities:   Cognitive Behavioral Therapy Feelings Identification Dialectical Behavioral Therapy   Klara Stjames [name], LCSWA

## 2022-12-04 DIAGNOSIS — F332 Major depressive disorder, recurrent severe without psychotic features: Secondary | ICD-10-CM | POA: Diagnosis not present

## 2022-12-04 LAB — GLUCOSE, CAPILLARY
Glucose-Capillary: 92 mg/dL (ref 70–99)
Glucose-Capillary: 109 mg/dL — ABNORMAL HIGH (ref 70–99)
Glucose-Capillary: 115 mg/dL — ABNORMAL HIGH (ref 70–99)
Glucose-Capillary: 182 mg/dL — ABNORMAL HIGH (ref 70–99)

## 2022-12-04 NOTE — Group Note (Signed)
LCSW Group Therapy Note  Group Date: 12/04/2022 Start Time: 1315 End Time: 1400   Type of Therapy and Topic:  Group Therapy - Healthy vs Unhealthy Coping Skills  Participation Level:  Minimal   Description of Group The focus of this group was to determine what unhealthy coping techniques typically are used by group members and what healthy coping techniques would be helpful in coping with various problems. Patients were guided in becoming aware of the differences between healthy and unhealthy coping techniques. Patients were asked to identify 2-3 healthy coping skills they would like to learn to use more effectively.  Therapeutic Goals Patients learned that coping is what human beings do all day long to deal with various situations in their lives Patients defined and discussed healthy vs unhealthy coping techniques Patients identified their preferred coping techniques and identified whether these were healthy or unhealthy Patients determined 2-3 healthy coping skills they would like to become more familiar with and use more often. Patients provided support and ideas to each other   Summary of Patient Progress:  During group, he expressed he was just looking at the information CSW provided and "taking it in" but did not provide any other input. Patient proved open to input from peers and feedback from [place]. Patient demonstrated poor insight into the subject matter, was respectful of peers, and participated throughout the entire session.   Therapeutic Modalities Cognitive Behavioral Therapy Motivational Interviewing  Jaymon Dudek [name], Latanya Presser 12/04/2022  2:28 PM

## 2022-12-04 NOTE — Plan of Care (Signed)
  Problem: Education: Goal: Knowledge of General Education information will improve Description: Including pain rating scale, medication(s)/side effects and non-pharmacologic comfort measures Outcome: Progressing   Problem: Health Behavior/Discharge Planning: Goal: Ability to manage health-related needs will improve Outcome: Progressing   Problem: Clinical Measurements: Goal: Ability to maintain clinical measurements within normal limits will improve Outcome: Progressing Goal: Will remain free from infection Outcome: Progressing Goal: Diagnostic test results will improve Outcome: Progressing Goal: Respiratory complications will improve Outcome: Progressing Goal: Cardiovascular complication will be avoided Outcome: Progressing   Problem: Activity: Goal: Risk for activity intolerance will decrease Outcome: Progressing   Problem: Nutrition: Goal: Adequate nutrition will be maintained Outcome: Progressing   Problem: Coping: Goal: Level of anxiety will decrease Outcome: Progressing   Problem: Elimination: Goal: Will not experience complications related to bowel motility Outcome: Progressing   

## 2022-12-04 NOTE — Group Note (Signed)
Date:  12/04/2022 Time:  10:16 AM  Group Topic/Focus:  Personal Choices and Values:   The focus of this group is to help patients assess and explore the importance of values in their lives, how their values affect their decisions, how they express their values and what opposes their expression.    Participation Level:  Active  Participation Quality:  Appropriate and Attentive  Affect:  Appropriate  Cognitive:  Alert and Appropriate  Insight: Appropriate  Engagement in Group:  Engaged  Modes of Intervention:  Discussion  Additional Comments:    Ladona Mow 12/04/2022, 10:16 AM

## 2022-12-04 NOTE — Group Note (Signed)
Recreation Therapy Group Note   Group Topic:General Recreation  Group Date: 12/04/2022 Start Time: 1400 End Time: 1445 Facilitators: Vilma Prader, LRT, CTRS Location:  Dayroom  Group Description: Trivia. Patients are split into two groups. LRT reads off trivia question for one team to answer within allotted time. If the first team is unable to answer or answers incorrectly, then the opposite team gets a chance at answering the question. At the end of the game, whichever team answers the most question correctly, wins. LRT facilitated post-game discussion on the importance of working well with others, active listening to others and being a part of a team. LRT and pts discussed how this can apply to life post-discharge.   Affect/Mood: Constricted and Flat   Participation Level: Non-verbal   Participation Quality: Minimum Cues    Behavior: Disinterested and Hesitant   Speech/Thought Process: N/A   Insight: N/A   Judgement: Lacking    Modes of Intervention: Activity   Patient Response to Interventions:  Disengaged   Education Outcome:  In group clarification offered    Clinical Observations/Individualized Feedback: [place] was not active in their participation of session activities and group discussion. Pt did not interact with peers or LRT during session. Pt was noticed to be falling asleep during group.  Plan: Continue to engage patient in RT group sessions 2-3x/week.   Vilma Prader, LRT, CTRS 12/04/2022 2:55 PM

## 2022-12-04 NOTE — Progress Notes (Signed)
D- Patient alert and oriented x  4. Patient quiet in his room Denies SI, HI, AVH, and pain.Patient encouraged to use his albuterol inhaler due to patient complaining of chest discomfort. Patient encouraged to use his inhaler and patient education provided on the importance of use of medications ordered appropriately. Patient presents with a pleasant mood and affect.     A- Scheduled medications administered to patient, per MD orders. Support and encouragement provided.  Routine safety checks conducted every 15 minutes.  Patient informed to notify staff with problems or concerns.  R- No adverse drug reactions noted. Patient contracts for safety at this time. Patient compliant with medications and treatment plan. Patient receptive, calm, and cooperative. Patient interacts well with others on the unit.Patient participated in group. Patient remains safe at this time.   12/04/22 0900  Charting Type  Charting Type Shift assessment  Safety Check Verification  Has the RN verified the 15 minute safety check completion? Yes  Neurological  Neuro (WDL) WDL  Orientation Level Oriented X4  Cognition Appropriate at baseline  Speech Clear  HEENT  HEENT (WDL) X  Teeth Poor dental hygiene  Voice Clear  Respiratory  Respiratory (WDL) WDL  Cough None  Respiratory Pattern Regular;Unlabored  Chest Assessment Chest expansion symmetrical  Cardiac  Cardiac (WDL) X  Vascular  Vascular (WDL) X  Integumentary  Integumentary (WDL) X  Skin Color Appropriate for ethnicity  Braden Scale (Ages 8 and up)  Sensory Perceptions 4  Moisture 4  Activity 3  Mobility 3  Nutrition 3  Friction and Shear 3  Braden Scale Score 20  Musculoskeletal  Musculoskeletal (WDL) X  Assistive Device Front wheel walker  Gastrointestinal  Gastrointestinal (WDL) WDL  GU Assessment  Genitourinary (WDL) WDL  Neurological  Level of Consciousness Alert

## 2022-12-04 NOTE — Plan of Care (Signed)
  Problem: Medication: Goal: Compliance with prescribed medication regimen will improve Outcome: Progressing   Problem: Self-Concept: Goal: Ability to disclose and discuss suicidal ideas will improve Outcome: Progressing Goal: Will verbalize positive feelings about self Outcome: Progressing Patient is compliant with treatment plan endorsing sadness and depression r/t living arrangements. Patient stated he has ongoing Chest Pain which is only relieved with oxycodone. Prn Tylenol and Neurontin given. Provider aware. Patient is interacting well with Peers and Staff and V/S WNL. No S/S of distress noted. Support and encouragement provided. Q 15 minutes safety checks ongoing without self harm gestures. Patient denies SI/HI/A/VH and verbally contracted for safety.

## 2022-12-04 NOTE — Progress Notes (Signed)
[facility] MD Progress Note  12/04/2022 1:25 PM Michael [name]  MRN:  [id number] Subjective: Michael [name] is seen on rounds.  I changed his nighttime medication from trazodone to Seroquel because he was having irregular vital signs.  His vital signs are stable today.  He gets paid 'Sunday and is homeless and social work is working on where he is going to go.  He says that he wants to stay in [place].  Plan is to discharge him on Monday. Principal Problem: Major depressive disorder, recurrent, severe without psychotic features ([name]) Diagnosis: Principal Problem:   Major depressive disorder, recurrent, severe without psychotic features ([name]) Active Problems:   Cocaine use disorder ([name])   MDD (major depressive disorder), recurrent episode, severe ([name])  Total Time spent with patient: 15 minutes  Past Psychiatric History:   Patient is not currently receiving any mental health or substance use services. Patient reports he was discharged from the hospital to [facility] in January, however they discharged him due to his health issues. Patient reports one previous psychiatric hospitalization a few years ago, due to a suicide attempt by cutting.     Past Medical History:  Past Medical History:  Diagnosis Date   A-fib ([name])    Anemia    Asthma    No PFTs, history of childhood asthma   CAD (coronary artery disease)    Cellulitis 04/2014   left facial   Cellulitis and abscess of toe of right foot 12/08/2019   Chondromalacia of medial femoral condyle    Left knee MRI 04/28/12: Chondromalacia of the medial femoral condyle with slight peripheral degeneration of the meniscocapsular junction of the medial meniscus; followed by sports medicine   Chronic kidney failure, stage 4 (severe) ([name])    Collagen vascular disease ([name])    Crack cocaine use    for 20+ years, has been enrolled in detox programs in the past   Depression    with history of hospitalization for suicidal ideation   Diabetes  mellitus 2002   Diagnosed in 2002, started insulin in 2012   Gout    Headache(784.0)    CT head 08/2011: Periventricular and subcortical white matter hypodensities are most in keeping with chronic microangiopathic change   HIV infection ([name]) 08/2011   Followed by Dr. Hatcher   Hyperlipidemia    Hypertension    Pulmonary embolism ([name])     Past Surgical History:  Procedure Laterality Date   AMPUTATION Right 07/21/2019   Procedure: RIGHT SECOND TOE AMPUTATION;  Surgeon: Duda, Marcus V, MD;  Location: [facility] OR;  Service: Orthopedics;  Laterality: Right;   BACK SURGERY     19'$ 65   BOWEL RESECTION     CARDIAC SURGERY     CERVICAL SPINE SURGERY     " rods in my neck "   CORONARY ARTERY BYPASS GRAFT     CORONARY STENT PLACEMENT     NM MYOCAR PERF WALL MOTION  12/27/2011   normal   SPINE SURGERY     Family History:  Family History  Problem Relation Age of Onset   Diabetes Mother    Hypertension Mother    Hyperlipidemia Mother    Diabetes Father    Cancer Father    Hypertension Father    Diabetes Brother    Heart disease Brother    Diabetes Sister    Colon cancer Neg Hx    Family Psychiatric  History: Unremarkable Social History:  Social History   Substance and Sexual Activity  Alcohol  Use No   Alcohol/week: 4.0 standard drinks of alcohol   Types: 2 Cans of beer, 2 Shots of liquor per week     Social History   Substance and Sexual Activity  Drug Use Not Currently   Frequency: 4.0 times per week   Types: "Crack" cocaine, Cocaine   Comment: Recent use of crack    Social History   Socioeconomic History   Marital status: Widowed    Spouse name: Not on file   Number of children: 2   Years of education: 65   Highest education level: 12th grade  Occupational History    Employer: UNEMPLOYED    Comment: 04/2016  Tobacco Use   Smoking status: Never   Smokeless tobacco: Never  Vaping Use   Vaping Use: Never used  Substance and Sexual Activity   Alcohol use: No     Alcohol/week: 4.0 standard drinks of alcohol    Types: 2 Cans of beer, 2 Shots of liquor per week   Drug use: Not Currently    Frequency: 4.0 times per week    Types: "Crack" cocaine, Cocaine    Comment: Recent use of crack   Sexual activity: Yes    Comment: DECLINED CONDOMS  Other Topics Concern   Not on file  Social History Narrative   Currently staying with a friend in [place].  Was staying @ local motel until a few days ago - left b/c of bed bugs.   [facility].  Not followed by a psychiatrist.   Social Determinants of Health   Financial Resource Strain: Medium Risk (09/16/2018)   Overall Financial Resource Strain (CARDIA)    Difficulty of Paying Living Expenses: Somewhat hard  Food Insecurity: No Food Insecurity (10/22/2022)   Hunger Vital Sign    Worried About Running Out of Food in the Last Year: Never true    [place] in the Last Year: Never true  Recent Concern: [place] Present (09/10/2022)   Hunger Vital Sign    Worried About Running Out of Food in the Last Year: Sometimes true    Ran Out of Food in the Last Year: Sometimes true  Transportation Needs: Unmet Transportation Needs (10/22/2022)   PRAPARE - Hydrologist (Medical): Yes    Lack of Transportation (Non-Medical): Yes  Physical Activity: Unknown (04/22/2019)   Exercise Vital Sign    Days of Exercise per Week: Not on file    Minutes of Exercise per Session: 0 min  Stress: Stress Concern Present (09/16/2018)   [place]    Feeling of Stress : Very much  Social Connections: Socially Isolated (09/16/2018)   Social Connection and Isolation Panel [NHANES]    Frequency of Communication with Friends and Family: Once a week    Frequency of Social Gatherings with Friends and Family: Never    Attends Religious Services: Never    Marine scientist or Organizations: No    Attends  Archivist Meetings: Never    Marital Status: Widowed   Additional Social History:                         Sleep: Good  Appetite:  Good  Current Medications: Current Facility-Administered Medications  Medication Dose Route Frequency Provider Last Rate Last Admin   acetaminophen (TYLENOL) tablet 650 mg  650 mg Oral Q6H PRN Mallie Darting,  NP   650 mg at 12/03/22 2121   albuterol (VENTOLIN HFA) 108 (90 Base) MCG/ACT inhaler 1-2 puff  1-2 puff Inhalation Q6H PRN Merlyn Lot E, NP   2 puff at 12/03/22 0918   alum & mag hydroxide-simeth (MAALOX/MYLANTA) 200-200-20 MG/5ML suspension 30 mL  30 mL Oral Q4H PRN Mallie Darting, NP       atorvastatin (LIPITOR) tablet 40 mg  40 mg Oral Daily Merlyn Lot E, NP   40 mg at 12/04/22 0900   diltiazem (CARDIZEM CD) 24 hr capsule 120 mg  120 mg Oral Daily Merlyn Lot E, NP   120 mg at 12/04/22 0901   DULoxetine (CYMBALTA) DR capsule 20 mg  20 mg Oral Daily Patrecia Pour, NP   20 mg at 12/04/22 0901   fluticasone furoate-vilanterol (BREO ELLIPTA) 200-25 MCG/ACT 1 puff  1 puff Inhalation Daily Merlyn Lot E, NP   1 puff at 12/04/22 0859   gabapentin (NEURONTIN) capsule 100 mg  100 mg Oral TID PRN Patrecia Pour, NP   100 mg at 12/03/22 2122   insulin aspart (novoLOG) injection 0-5 Units  0-5 Units Subcutaneous QHS Merlyn Lot E, NP       insulin aspart (novoLOG) injection 0-9 Units  0-9 Units Subcutaneous TID WC Merlyn Lot E, NP   3 Units at 12/03/22 1728   isosorbide mononitrate (IMDUR) 24 hr tablet 15 mg  15 mg Oral Daily Merlyn Lot E, NP   15 mg at 12/04/22 0900   OLANZapine zydis (ZYPREXA) disintegrating tablet 5 mg  5 mg Oral Q8H PRN Mallie Darting, NP       And   LORazepam (ATIVAN) tablet 1 mg  1 mg Oral PRN Mallie Darting, NP       And   ziprasidone (GEODON) injection 20 mg  20 mg Intramuscular PRN Merlyn Lot E, NP       magnesium hydroxide (MILK OF MAGNESIA) suspension 30 mL  30 mL Oral Daily PRN Merlyn Lot E, NP   30 mL at 11/30/22 [id number]   multivitamin with minerals tablet 1 tablet  1 tablet Oral Daily Parks Ranger, DO   1 tablet at 12/04/22 0901   polyethylene glycol (MIRALAX / GLYCOLAX) packet 17 g  17 g Oral BID Dorothe Pea, RPH   17 g at 12/04/22 0900   QUEtiapine (SEROQUEL) tablet 50 mg  50 mg Oral QHS Parks Ranger, DO   50 mg at 12/03/22 2121    Lab Results:  Results for orders placed or performed during the hospital encounter of 11/27/22 (from the past 48 hour(s))  Glucose, capillary     Status: Abnormal   Collection Time: 12/02/22  4:22 PM  Result Value Ref Range   Glucose-Capillary 134 (H) 70 - 99 mg/dL    Comment: Glucose reference range applies only to samples taken after fasting for at least 8 hours.  Glucose, capillary     Status: Abnormal   Collection Time: 12/02/22  8:16 PM  Result Value Ref Range   Glucose-Capillary 153 (H) 70 - 99 mg/dL    Comment: Glucose reference range applies only to samples taken after fasting for at least 8 hours.   Comment 1 Notify RN   Glucose, capillary     Status: Abnormal   Collection Time: 12/03/22  7:53 AM  Result Value Ref Range   Glucose-Capillary 108 (H) 70 - 99 mg/dL    Comment: Glucose reference range applies only to samples  taken after fasting for at least 8 hours.  Glucose, capillary     Status: Abnormal   Collection Time: 12/03/22 11:36 AM  Result Value Ref Range   Glucose-Capillary 133 (H) 70 - 99 mg/dL    Comment: Glucose reference range applies only to samples taken after fasting for at least 8 hours.  Glucose, capillary     Status: Abnormal   Collection Time: 12/03/22  4:41 PM  Result Value Ref Range   Glucose-Capillary 204 (H) 70 - 99 mg/dL    Comment: Glucose reference range applies only to samples taken after fasting for at least 8 hours.  Glucose, capillary     Status: Abnormal   Collection Time: 12/03/22  7:42 PM  Result Value Ref Range   Glucose-Capillary 145 (H) 70 - 99 mg/dL     Comment: Glucose reference range applies only to samples taken after fasting for at least 8 hours.  Glucose, capillary     Status: None   Collection Time: 12/04/22  7:29 AM  Result Value Ref Range   Glucose-Capillary 92 70 - 99 mg/dL    Comment: Glucose reference range applies only to samples taken after fasting for at least 8 hours.   Comment 1 Notify RN   Glucose, capillary     Status: Abnormal   Collection Time: 12/04/22 11:41 AM  Result Value Ref Range   Glucose-Capillary 109 (H) 70 - 99 mg/dL    Comment: Glucose reference range applies only to samples taken after fasting for at least 8 hours.    Blood Alcohol level:  Lab Results  Component Value Date   ETH <10 11/26/2022   ETH <10 [id number]    Metabolic Disorder Labs: Lab Results  Component Value Date   HGBA1C 6.8 (H) 09/30/2022   MPG 148 09/30/2022   MPG 174.29 07/02/2022   No results found for: "PROLACTIN" Lab Results  Component Value Date   CHOL 149 04/25/2021   TRIG 166 (H) 04/25/2021   HDL 57 04/25/2021   CHOLHDL 2.6 04/25/2021   VLDL 27 12/09/2019   LDLCALC 68 04/25/2021   LDLCALC 120 (H) 12/09/2019    Physical Findings: AIMS:  , ,  ,  ,    CIWA:    COWS:     Musculoskeletal: Strength & Muscle Tone: within normal limits Gait & Station: normal Patient leans: N/A  Psychiatric Specialty Exam:  Presentation  General Appearance:  Appropriate for Environment  Eye Contact: Good  Speech: Clear and Coherent  Speech Volume: Normal  Handedness: Right   Mood and Affect  Mood: Euthymic  Affect: Appropriate   Thought Process  Thought Processes: Coherent  Descriptions of Associations:Intact  Orientation:Full (Time, Place and Person)  Thought Content:WDL  History of Schizophrenia/Schizoaffective disorder:No  Duration of Psychotic Symptoms:No data recorded Hallucinations:No data recorded Ideas of Reference:None  Suicidal Thoughts:No data recorded Homicidal Thoughts:No data  recorded  Sensorium  Memory: Immediate Fair; Recent Fair  Judgment: Fair  Insight: Fair   Community education officer  Concentration: Fair  Attention Span: Fair  Recall: [employer] of Knowledge: Fair  Language: Good   Psychomotor Activity  Psychomotor Activity:No data recorded  Assets  Assets: Communication Skills   Sleep  Sleep:No data recorded   Physical Exam: Physical Exam Vitals and nursing note reviewed.  Constitutional:      Appearance: Normal appearance. He is normal weight.  Neurological:     General: No focal deficit present.     Mental Status: He is alert and oriented to person,  place, and time.  Psychiatric:        Attention and Perception: Attention and perception normal.        Mood and Affect: Mood and affect normal.        Speech: Speech normal.        Behavior: Behavior normal. Behavior is cooperative.        Thought Content: Thought content normal.        Cognition and Memory: Cognition and memory normal.        Judgment: Judgment normal.    Review of Systems  Constitutional: Negative.   HENT: Negative.    Eyes: Negative.   Respiratory: Negative.    Cardiovascular: Negative.   Gastrointestinal: Negative.   Genitourinary: Negative.   Musculoskeletal: Negative.   Skin: Negative.   Neurological: Negative.   Endo/Heme/Allergies: Negative.   Psychiatric/Behavioral: Negative.     Blood pressure 120/72, pulse (!) 59, temperature 98.7 F (37.1 C), temperature source Oral, resp. rate 18, height '5\' 8"'$  (1.727 m), weight 67.2 kg, SpO2 100 %. Body mass index is 22.53 kg/m.   Treatment Plan Summary: Daily contact with patient to assess and evaluate symptoms and progress in treatment, Medication management, and Plan continue current medications.  [place], DO 12/04/2022, 1:25 PM

## 2022-12-04 NOTE — BH IP Treatment Plan (Signed)
Interdisciplinary Treatment and Diagnostic Plan Update  12/04/2022 Time of Session: 9:30AM Jaysten Rockman MRN: [id number]  Principal Diagnosis: Major depressive disorder, recurrent, severe without psychotic features ([place])  Secondary Diagnoses: Principal Problem:   Major depressive disorder, recurrent, severe without psychotic features ([place]) Active Problems:   Cocaine use disorder ([place])   MDD (major depressive disorder), recurrent episode, severe ([place])   Current Medications:  Current Facility-Administered Medications  Medication Dose Route Frequency Provider Last Rate Last Admin   acetaminophen (TYLENOL) tablet 650 mg  650 mg Oral Q6H PRN Mallie Darting, NP   650 mg at 12/03/22 2121   albuterol (VENTOLIN HFA) 108 (90 Base) MCG/ACT inhaler 1-2 puff  1-2 puff Inhalation Q6H PRN Mallie Darting, NP   2 puff at 12/03/22 0918   alum & mag hydroxide-simeth (MAALOX/MYLANTA) 200-200-20 MG/5ML suspension 30 mL  30 mL Oral Q4H PRN Mallie Darting, NP       atorvastatin (LIPITOR) tablet 40 mg  40 mg Oral Daily Merlyn Lot E, NP   40 mg at 12/04/22 0900   diltiazem (CARDIZEM CD) 24 hr capsule 120 mg  120 mg Oral Daily Merlyn Lot E, NP   120 mg at 12/04/22 0901   DULoxetine (CYMBALTA) DR capsule 20 mg  20 mg Oral Daily Patrecia Pour, NP   20 mg at 12/04/22 0901   fluticasone furoate-vilanterol (BREO ELLIPTA) 200-25 MCG/ACT 1 puff  1 puff Inhalation Daily Merlyn Lot E, NP   1 puff at 12/04/22 0859   gabapentin (NEURONTIN) capsule 100 mg  100 mg Oral TID PRN Patrecia Pour, NP   100 mg at 12/03/22 2122   insulin aspart (novoLOG) injection 0-5 Units  0-5 Units Subcutaneous QHS Merlyn Lot E, NP       insulin aspart (novoLOG) injection 0-9 Units  0-9 Units Subcutaneous TID WC Merlyn Lot E, NP   3 Units at 12/03/22 1728   isosorbide mononitrate (IMDUR) 24 hr tablet 15 mg  15 mg Oral Daily Merlyn Lot E, NP   15 mg at 12/04/22 0900   OLANZapine zydis (ZYPREXA) disintegrating tablet 5  mg  5 mg Oral Q8H PRN Mallie Darting, NP       And   LORazepam (ATIVAN) tablet 1 mg  1 mg Oral PRN Mallie Darting, NP       And   ziprasidone (GEODON) injection 20 mg  20 mg Intramuscular PRN Merlyn Lot E, NP       magnesium hydroxide (MILK OF MAGNESIA) suspension 30 mL  30 mL Oral Daily PRN Merlyn Lot E, NP   30 mL at 11/30/22 [id number]   multivitamin with minerals tablet 1 tablet  1 tablet Oral Daily Parks Ranger, DO   1 tablet at 12/04/22 0901   polyethylene glycol (MIRALAX / GLYCOLAX) packet 17 g  17 g Oral BID Dorothe Pea, RPH   17 g at 12/04/22 0900   QUEtiapine (SEROQUEL) tablet 50 mg  50 mg Oral QHS Parks Ranger, DO   50 mg at 12/03/22 2121   PTA Medications: Medications Prior to Admission  Medication Sig Dispense Refill Last Dose   Accu-Chek Softclix Lancets lancets Use as directed up to four times daily 100 each 0    albuterol (VENTOLIN HFA) 108 (90 Base) MCG/ACT inhaler Inhale 1-2 puffs into the lungs every 6 (six) hours as needed for wheezing or shortness of breath. (Patient not taking: Reported on 11/14/2022) 6.7 g 0    atorvastatin (LIPITOR)  40 MG tablet Take 1 tablet (40 mg total) by mouth daily. (Patient not taking: Reported on 11/14/2022) 30 tablet 0    Blood Glucose Monitoring Suppl (ACCU-CHEK GUIDE) w/Device KIT Use as directed. 1 kit 0    budesonide-formoterol (SYMBICORT) 160-4.5 MCG/ACT inhaler Inhale 2 puffs into the lungs in the morning and at bedtime. (Patient not taking: Reported on 11/14/2022) 10.2 g 0    diltiazem (CARDIZEM CD) 120 MG 24 hr capsule Take 1 capsule (120 mg total) by mouth daily. (Patient not taking: Reported on 11/14/2022) 30 capsule 0    ferrous sulfate 325 (65 FE) MG tablet Take 1 tablet (325 mg total) by mouth daily with breakfast. (Patient not taking: Reported on 11/14/2022) 30 tablet 0    FLUoxetine (PROZAC) 20 MG capsule Take 1 capsule (20 mg total) by mouth daily. (Patient not taking: Reported on 11/14/2022) 30 capsule 0     gabapentin (NEURONTIN) 300 MG capsule Take 1 capsule (300 mg total) by mouth at bedtime. (Patient not taking: Reported on 11/14/2022) 30 capsule 0    glucose blood test strip use as directed up to four times daily 100 each 0    hydrALAZINE (APRESOLINE) 50 MG tablet Take 1 tablet (50 mg total) by mouth every 8 (eight) hours. (Patient not taking: Reported on 11/14/2022) 90 tablet 0    Insulin Pen Needle 31G X 5 MM MISC Use with insulin pens (Patient taking differently: 1 each by Other route See admin instructions. Use with insulin pens) 100 each 1    Insulin Pen Needle 32G X 4 MM MISC Use as directed up to 4 times daily. 100 each 0    isosorbide mononitrate (IMDUR) 30 MG 24 hr tablet Take 0.5 tablets (15 mg total) by mouth daily. (Patient not taking: Reported on 11/14/2022) 15 tablet 0    meclizine (ANTIVERT) 25 MG tablet Take 1 tablet (25 mg total) by mouth 3 (three) times daily as needed for dizziness. (Patient not taking: Reported on 10/22/2022) 30 tablet 0    nitroGLYCERIN (NITROSTAT) 0.4 MG SL tablet Place 1 tablet (0.4 mg total) under the tongue every 5 (five) minutes as needed for chest pain. (Patient not taking: Reported on 10/22/2022) 25 tablet 0    polyethylene glycol powder (GLYCOLAX/MIRALAX) 17 GM/SCOOP powder Take 17 g by mouth 2 (two) times daily. (Patient not taking: Reported on 10/22/2022) 238 g 0     Patient Stressors: Financial difficulties   Health problems   Substance abuse    Patient Strengths: Marketing executive fund of knowledge   Treatment Modalities: Medication Management, Group therapy, Case management,  1 to 1 session with clinician, Psychoeducation, Recreational therapy.   Physician Treatment Plan for Primary Diagnosis: Major depressive disorder, recurrent, severe without psychotic features ([place]) Long Term Goal(s): Improvement in symptoms so as ready for discharge   Short Term Goals: Ability to identify changes in lifestyle to reduce recurrence of condition will  improve Ability to verbalize feelings will improve Ability to disclose and discuss suicidal ideas Ability to demonstrate self-control will improve Ability to identify and develop effective coping behaviors will improve Ability to maintain clinical measurements within normal limits will improve Compliance with prescribed medications will improve Ability to identify triggers associated with substance abuse/mental health issues will improve  Medication Management: Evaluate patient's response, side effects, and tolerance of medication regimen.  Therapeutic Interventions: 1 to 1 sessions, Unit Group sessions and Medication administration.  Evaluation of Outcomes: Progressing  Physician Treatment Plan for Secondary Diagnosis: Principal Problem:  Major depressive disorder, recurrent, severe without psychotic features ([place]) Active Problems:   Cocaine use disorder ([name])   MDD (major depressive disorder), recurrent episode, severe ([place])  Long Term Goal(s): Improvement in symptoms so as ready for discharge   Short Term Goals: Ability to identify changes in lifestyle to reduce recurrence of condition will improve Ability to verbalize feelings will improve Ability to disclose and discuss suicidal ideas Ability to demonstrate self-control will improve Ability to identify and develop effective coping behaviors will improve Ability to maintain clinical measurements within normal limits will improve Compliance with prescribed medications will improve Ability to identify triggers associated with substance abuse/mental health issues will improve     Medication Management: Evaluate patient's response, side effects, and tolerance of medication regimen.  Therapeutic Interventions: 1 to 1 sessions, Unit Group sessions and Medication administration.  Evaluation of Outcomes: Progressing   RN Treatment Plan for Primary Diagnosis: Major depressive disorder, recurrent, severe without psychotic features  ([place]) Long Term Goal(s): Knowledge of disease and therapeutic regimen to maintain health will improve  Short Term Goals: Ability to remain free from injury will improve, Ability to verbalize frustration and anger appropriately will improve, Ability to demonstrate self-control, Ability to participate in decision making will improve, Ability to verbalize feelings will improve, Ability to disclose and discuss suicidal ideas, Ability to identify and develop effective coping behaviors will improve, and Compliance with prescribed medications will improve  Medication Management: RN will administer medications as ordered by provider, will assess and evaluate patient's response and provide education to patient for prescribed medication. RN will report any adverse and/or side effects to prescribing provider.  Therapeutic Interventions: 1 on 1 counseling sessions, Psychoeducation, Medication administration, Evaluate responses to treatment, Monitor vital signs and CBGs as ordered, Perform/monitor CIWA, COWS, AIMS and Fall Risk screenings as ordered, Perform wound care treatments as ordered.  Evaluation of Outcomes: Progressing   LCSW Treatment Plan for Primary Diagnosis: Major depressive disorder, recurrent, severe without psychotic features ([place]) Long Term Goal(s): Safe transition to appropriate next level of care at discharge, Engage patient in therapeutic group addressing interpersonal concerns.  Short Term Goals: Engage patient in aftercare planning with referrals and resources, Increase social support, Increase ability to appropriately verbalize feelings, Increase emotional regulation, Facilitate acceptance of mental health diagnosis and concerns, Facilitate patient progression through stages of change regarding substance use diagnoses and concerns, Identify triggers associated with mental health/substance abuse issues, and Increase skills for wellness and recovery  Therapeutic Interventions: Assess for  all discharge needs, 1 to 1 time with Social worker, Explore available resources and support systems, Assess for adequacy in community support network, Educate family and significant other(s) on suicide prevention, Complete Psychosocial Assessment, Interpersonal group therapy.  Evaluation of Outcomes: Progressing   Progress in Treatment: Attending groups: Yes. Participating in groups: Yes. Taking medication as prescribed: Yes. Toleration medication: Yes. Family/Significant other contact made: Yes, individual(s) contacted:  SPE completed with pt's son Mourad Lamanna Patient understands diagnosis: Yes. Discussing patient identified problems/goals with staff: Yes. Medical problems stabilized or resolved: Yes. Denies suicidal/homicidal ideation: Yes. Issues/concerns per patient self-inventory: No. Other: None  New problem(s) identified: No, Describe:  None  New Short Term/Long Term Goal(s): Patient to work towards detox, medication management for mood stabilization; elimination of SI thoughts; development of comprehensive mental wellness/sobriety plan. Update 12/04/22: No changes at this time.    Patient Goals:  "to get my physical and mental health better" Update 12/04/22: No changes at this time.    Discharge Plan or  Barriers: CSW will assist pt with development of appropriate discharge/aftercare plan. Update 12/04/22: No changes at this time.    Reason for Continuation of Hospitalization: Depression Medication stabilization   Estimated Length of Stay: 1-7 days  Last 3 [place] Suicide Severity Risk Score: Flowsheet Row Admission (Current) from 11/27/2022 in [facility] from 11/26/2022 in [facility] Emergency Department at [place] [facility] from 11/16/2022 in [facility] Emergency Department at [address] Error: Q7 should not be populated when Q6 is No Low Risk Low Risk       Last PHQ 2/9 Scores:    11/12/2021   12:28 AM  11/04/2021    3:12 AM 10/07/2021    4:31 PM  Depression screen PHQ 2/9  Decreased Interest '2 2 2  '$ Down, Depressed, Hopeless '3 3 2  '$ PHQ - 2 Score '5 5 4  '$ Altered sleeping '2 2 2  '$ Tired, decreased energy 3 2 0  Change in appetite '1 1 1  '$ Feeling bad or failure about yourself  '2 2 2  '$ Trouble concentrating 0 1 0  Moving slowly or fidgety/restless 0 1 0  Suicidal thoughts '3 2 1  '$ PHQ-9 Score '16 16 10  '$ Difficult doing work/chores Extremely dIfficult Very difficult Very difficult    Scribe for Treatment Team: Shemeca Lukasik [name], Latanya Presser 12/04/2022 9:45 AM

## 2022-12-05 LAB — GLUCOSE, CAPILLARY
Glucose-Capillary: 98 mg/dL (ref 70–99)
Glucose-Capillary: 96 mg/dL (ref 70–99)
Glucose-Capillary: 144 mg/dL — ABNORMAL HIGH (ref 70–99)
Glucose-Capillary: 117 mg/dL — ABNORMAL HIGH (ref 70–99)

## 2022-12-05 NOTE — Plan of Care (Signed)
Pt endorses anxiety/depression at this time. Pt denies SI/HI/AVH or pain at this time. Pt is calm and cooperative however interacts minimally. Pt comes out for meals, groups, and medications however does not interact with other patients. Pt participation during groups must be encouraged or coached. Pt is medication compliant. Pt provided with support and encouragement. Pt monitored q15 minutes for safety per unit policy. Plan of care ongoing.   Problem: Education: Goal: Knowledge of General Education information will improve Description: Including pain rating scale, medication(s)/side effects and non-pharmacologic comfort measures Outcome: Progressing   Problem: Coping: Goal: Level of anxiety will decrease Outcome: Not Progressing

## 2022-12-05 NOTE — Group Note (Signed)
[facility] LCSW Group Therapy Note   Group Date: 12/05/2022 Start Time: [id number] End Time: 1415   Type of Therapy/Topic:  Group Therapy:  Balance in Life  Participation Level:  Minimal   Description of Group:    This group will address the concept of balance and how it feels and looks when one is unbalanced. Patients will be encouraged to process areas in their lives that are out of balance, and identify reasons for remaining unbalanced. Facilitators will guide patients utilizing problem- solving interventions to address and correct the stressor making their life unbalanced. Understanding and applying boundaries will be explored and addressed for obtaining  and maintaining a balanced life. Patients will be encouraged to explore ways to assertively make their unbalanced needs known to significant others in their lives, using other group members and facilitator for support and feedback.  Therapeutic Goals: Patient will identify two or more emotions or situations they have that consume much of in their lives. Patient will identify signs/triggers that life has become out of balance:  Patient will identify two ways to set boundaries in order to achieve balance in their lives:  Patient will demonstrate ability to communicate their needs through discussion and/or role plays  Summary of Patient Progress:    Patient was present for the entirety of the group session. Patient was an active listener but did not participate in the topic of discussion or provide helpful advice to others. He did not verbalize in conversation, but did participate in self-care assessment activity.     Therapeutic Modalities:   Cognitive Behavioral Therapy Solution-Focused Therapy Assertiveness Training   [place] [name], LCSWA

## 2022-12-05 NOTE — Progress Notes (Signed)
   12/05/22 0500  Psych Admission Type (Psych Patients Only)  Admission Status Voluntary  Psychosocial Assessment  Patient Complaints None  Eye Contact Fair  Facial Expression Flat  Affect Appropriate to circumstance  Speech Soft  Interaction Minimal  Motor Activity Slow  Appearance/Hygiene Unremarkable;In scrubs  Behavior Characteristics Cooperative;Appropriate to situation;Calm  Mood Pleasant  Thought Process  Coherency WDL  Content WDL  Delusions None reported or observed  Perception WDL  Hallucination None reported or observed  Judgment WDL  Confusion None  Danger to Self  Current suicidal ideation? Denies  Agreement Not to Harm Self Yes  Description of Agreement verbal  Danger to Others  Danger to Others None reported or observed

## 2022-12-05 NOTE — Group Note (Signed)
Recreation Therapy Group Note   Group Topic:Problem Solving  Group Date: 12/05/2022 Start Time: 1415 End Time: 1500 Facilitators: Vilma Prader, LRT, CTRS Location:  Dayroom  Group Description: Life Boat. Patients were given the scenario that they are on a boat that is about to become shipwrecked, leaving them stranded on an [place]. They are asked to make a list of 15 different items that they want to take with them when they are stranded on the [place]. Patients are asked to rank their items from most important to least important, #1 being the most important and #15 being the least. Patients or LRT will read aloud the 15 different items to the group after. LRT facilitated post-activity processing to discuss how this activity can be used in daily life post discharge.  Affect/Mood: Flat   Participation Level: Minimal   Participation Quality: Moderate Cues   Behavior: Disinterested   Speech/Thought Process: Unfocused   Insight: Lacking   Judgement: Limited   Modes of Intervention: Activity and Group work   Patient Response to Interventions:  Disengaged   Education Outcome:  In group clarification offered    Clinical Observations/Individualized Feedback: Michael [name] was minimally active in their participation of session activities and group discussion. Pt only spoke when LRT spoke directly to him. Peers attempted to speak and include him, however he was not interested. Pt affect brightened when LRT spoke to him but lessened when conversation was over.    Plan: Continue to engage patient in RT group sessions 2-3x/week.   [address] LRT, [place] 12/05/2022 3:20 PM

## 2022-12-05 NOTE — Progress Notes (Signed)
[facility] MD Progress Note  12/05/2022 12:11 PM Michael [name]  MRN:  [id number] Subjective: Michael [name] is seen on rounds.  He has been compliant with medications.  He states his mood is better.  He denies any suicidal ideation.  He is sleeping better and eating better.  He denies any side effects from his medication. Principal Problem: Major depressive disorder, recurrent, severe without psychotic features ([place]) Diagnosis: Principal Problem:   Major depressive disorder, recurrent, severe without psychotic features ([place]) Active Problems:   Cocaine use disorder ([place])   MDD (major depressive disorder), recurrent episode, severe ([place])  Total Time spent with patient: 15 minutes  Past Psychiatric History:  Patient is not currently receiving any mental health or substance use services. Patient reports he was discharged from the hospital to [facility] in January, however they discharged him due to his health issues. Patient reports one previous psychiatric hospitalization a few years ago, due to a suicide attempt by cutting.      Past Medical History:  Past Medical History:  Diagnosis Date   A-fib ([place])    Anemia    Asthma    No PFTs, history of childhood asthma   CAD (coronary artery disease)    Cellulitis 04/2014   left facial   Cellulitis and abscess of toe of right foot 12/08/2019   Chondromalacia of medial femoral condyle    Left knee MRI 04/28/12: Chondromalacia of the medial femoral condyle with slight peripheral degeneration of the meniscocapsular junction of the medial meniscus; followed by sports medicine   Chronic kidney failure, stage 4 (severe) ([place])    Collagen vascular disease ([place])    Crack cocaine use    for 20+ years, has been enrolled in detox programs in the past   Depression    with history of hospitalization for suicidal ideation   Diabetes mellitus 2002   Diagnosed in 2002, started insulin in 2012   Gout    Headache(784.0)    CT head 08/2011:  Periventricular and subcortical white matter hypodensities are most in keeping with chronic microangiopathic change   HIV infection ([place]) 08/2011   Followed by [name]   Hyperlipidemia    Hypertension    Pulmonary embolism [facility])     Past Surgical History:  Procedure Laterality Date   AMPUTATION Right 07/21/2019   Procedure: RIGHT SECOND TOE AMPUTATION;  Surgeon: [name], MD;  Location: [place];  Service: Orthopedics;  Laterality: Right;   BACK SURGERY     1988   BOWEL RESECTION     CARDIAC SURGERY     CERVICAL SPINE SURGERY     " rods in my neck "   CORONARY ARTERY BYPASS GRAFT     CORONARY STENT PLACEMENT     NM MYOCAR PERF WALL MOTION  12/27/2011   normal   SPINE SURGERY     Family History:  Family History  Problem Relation Age of Onset   Diabetes Mother    Hypertension Mother    Hyperlipidemia Mother    Diabetes Father    Cancer Father    Hypertension Father    Diabetes Brother    Heart disease Brother    Diabetes Sister    Colon cancer Neg Hx    Family Psychiatric  History: Unremarkable Social History:  Social History   Substance and Sexual Activity  Alcohol Use No   Alcohol/week: 4.0 standard drinks of alcohol   Types: 2 Cans of beer, 2 Shots of liquor per week  Social History   Substance and Sexual Activity  Drug Use Not Currently   Frequency: 4.0 times per week   Types: "Crack" cocaine, Cocaine   Comment: Recent use of crack    Social History   Socioeconomic History   Marital status: Widowed    Spouse name: Not on file   Number of children: 2   Years of education: 27   Highest education level: 12th grade  Occupational History    Employer: UNEMPLOYED    Comment: 04/2016  Tobacco Use   Smoking status: Never   Smokeless tobacco: Never  Vaping Use   Vaping Use: Never used  Substance and Sexual Activity   Alcohol use: No    Alcohol/week: 4.0 standard drinks of alcohol    Types: 2 Cans of beer, 2 Shots of liquor per week   Drug use:  Not Currently    Frequency: 4.0 times per week    Types: "Crack" cocaine, Cocaine    Comment: Recent use of crack   Sexual activity: Yes    Comment: DECLINED CONDOMS  Other Topics Concern   Not on file  Social History Narrative   Currently staying with a friend in [place].  Was staying @ local motel until a few days ago - left b/c of bed bugs.   [facility].  Not followed by a psychiatrist.   Social Determinants of Health   Financial Resource Strain: Medium Risk (09/16/2018)   Overall Financial Resource Strain (CARDIA)    Difficulty of Paying Living Expenses: Somewhat hard  Food Insecurity: No Food Insecurity (10/22/2022)   Hunger Vital Sign    Worried About Running Out of Food in the Last Year: Never true    [place] in the Last Year: Never true  Recent Concern: [place] Present (09/10/2022)   Hunger Vital Sign    Worried About Running Out of Food in the Last Year: Sometimes true    Ran Out of Food in the Last Year: Sometimes true  Transportation Needs: Unmet Transportation Needs (10/22/2022)   PRAPARE - Hydrologist (Medical): Yes    Lack of Transportation (Non-Medical): Yes  Physical Activity: Unknown (04/22/2019)   Exercise Vital Sign    Days of Exercise per Week: Not on file    Minutes of Exercise per Session: 0 min  Stress: Stress Concern Present (09/16/2018)   [place]    Feeling of Stress : Very much  Social Connections: Socially Isolated (09/16/2018)   Social Connection and Isolation Panel [NHANES]    Frequency of Communication with Friends and Family: Once a week    Frequency of Social Gatherings with Friends and Family: Never    Attends Religious Services: Never    Marine scientist or Organizations: No    Attends Archivist Meetings: Never    Marital Status: Widowed   Additional Social History:                          Sleep: Good  Appetite:  Good  Current Medications: Current Facility-Administered Medications  Medication Dose Route Frequency Provider Last Rate Last Admin   acetaminophen (TYLENOL) tablet 650 mg  650 mg Oral Q6H PRN Merlyn Lot E, NP   650 mg at 12/05/22 0903   albuterol (VENTOLIN HFA) 108 (90 Base) MCG/ACT inhaler 1-2 puff  1-2 puff Inhalation Q6H PRN Jerelene Redden,  Collie Siad, NP   2 puff at 12/03/22 0918   alum & mag hydroxide-simeth (MAALOX/MYLANTA) 200-200-20 MG/5ML suspension 30 mL  30 mL Oral Q4H PRN Mallie Darting, NP       atorvastatin (LIPITOR) tablet 40 mg  40 mg Oral Daily Merlyn Lot E, NP   40 mg at 12/05/22 0903   diltiazem (CARDIZEM CD) 24 hr capsule 120 mg  120 mg Oral Daily Merlyn Lot E, NP   120 mg at 12/05/22 [id number]   DULoxetine (CYMBALTA) DR capsule 20 mg  20 mg Oral Daily Patrecia Pour, NP   20 mg at 12/05/22 0903   fluticasone furoate-vilanterol (BREO ELLIPTA) 200-25 MCG/ACT 1 puff  1 puff Inhalation Daily Merlyn Lot E, NP   1 puff at 12/05/22 0905   gabapentin (NEURONTIN) capsule 100 mg  100 mg Oral TID PRN Patrecia Pour, NP   100 mg at 12/05/22 [id number]   insulin aspart (novoLOG) injection 0-5 Units  0-5 Units Subcutaneous QHS Merlyn Lot E, NP       insulin aspart (novoLOG) injection 0-9 Units  0-9 Units Subcutaneous TID WC Merlyn Lot E, NP   3 Units at 12/03/22 1728   isosorbide mononitrate (IMDUR) 24 hr tablet 15 mg  15 mg Oral Daily Merlyn Lot E, NP   15 mg at 12/05/22 0904   OLANZapine zydis (ZYPREXA) disintegrating tablet 5 mg  5 mg Oral Q8H PRN Mallie Darting, NP       And   LORazepam (ATIVAN) tablet 1 mg  1 mg Oral PRN Mallie Darting, NP       And   ziprasidone (GEODON) injection 20 mg  20 mg Intramuscular PRN Merlyn Lot E, NP       magnesium hydroxide (MILK OF MAGNESIA) suspension 30 mL  30 mL Oral Daily PRN Merlyn Lot E, NP   30 mL at 11/30/22 [id number]   multivitamin with minerals tablet 1 tablet  1 tablet Oral Daily  Parks Ranger, DO   1 tablet at 12/05/22 [id number]   polyethylene glycol (MIRALAX / GLYCOLAX) packet 17 g  17 g Oral BID Dorothe Pea, RPH   17 g at 12/05/22 [id number]   QUEtiapine (SEROQUEL) tablet 50 mg  50 mg Oral QHS Parks Ranger, DO   50 mg at 12/04/22 2204    Lab Results:  Results for orders placed or performed during the hospital encounter of 11/27/22 (from the past 48 hour(s))  Glucose, capillary     Status: Abnormal   Collection Time: 12/03/22  4:41 PM  Result Value Ref Range   Glucose-Capillary 204 (H) 70 - 99 mg/dL    Comment: Glucose reference range applies only to samples taken after fasting for at least 8 hours.  Glucose, capillary     Status: Abnormal   Collection Time: 12/03/22  7:42 PM  Result Value Ref Range   Glucose-Capillary 145 (H) 70 - 99 mg/dL    Comment: Glucose reference range applies only to samples taken after fasting for at least 8 hours.  Glucose, capillary     Status: None   Collection Time: 12/04/22  7:29 AM  Result Value Ref Range   Glucose-Capillary 92 70 - 99 mg/dL    Comment: Glucose reference range applies only to samples taken after fasting for at least 8 hours.   Comment 1 Notify RN   Glucose, capillary     Status: Abnormal   Collection Time: 12/04/22 11:41 AM  Result Value Ref  Range   Glucose-Capillary 109 (H) 70 - 99 mg/dL    Comment: Glucose reference range applies only to samples taken after fasting for at least 8 hours.  Glucose, capillary     Status: Abnormal   Collection Time: 12/04/22  4:38 PM  Result Value Ref Range   Glucose-Capillary 115 (H) 70 - 99 mg/dL    Comment: Glucose reference range applies only to samples taken after fasting for at least 8 hours.   Comment 1 Notify RN   Glucose, capillary     Status: Abnormal   Collection Time: 12/04/22  8:38 PM  Result Value Ref Range   Glucose-Capillary 182 (H) 70 - 99 mg/dL    Comment: Glucose reference range applies only to samples taken after fasting for at least 8  hours.   Comment 1 Notify RN   Glucose, capillary     Status: None   Collection Time: 12/05/22  7:57 AM  Result Value Ref Range   Glucose-Capillary 98 70 - 99 mg/dL    Comment: Glucose reference range applies only to samples taken after fasting for at least 8 hours.  Glucose, capillary     Status: None   Collection Time: 12/05/22 11:43 AM  Result Value Ref Range   Glucose-Capillary 96 70 - 99 mg/dL    Comment: Glucose reference range applies only to samples taken after fasting for at least 8 hours.    Blood Alcohol level:  Lab Results  Component Value Date   ETH <10 11/26/2022   ETH <10 [id number]    Metabolic Disorder Labs: Lab Results  Component Value Date   HGBA1C 6.8 (H) 09/30/2022   MPG 148 09/30/2022   MPG 174.29 07/02/2022   No results found for: "PROLACTIN" Lab Results  Component Value Date   CHOL 149 04/25/2021   TRIG 166 (H) 04/25/2021   HDL 57 04/25/2021   CHOLHDL 2.6 04/25/2021   VLDL 27 12/09/2019   LDLCALC 68 04/25/2021   LDLCALC 120 (H) 12/09/2019    Physical Findings: AIMS:  , ,  ,  ,    CIWA:    COWS:     Musculoskeletal: Strength & Muscle Tone: within normal limits Gait & Station: normal Patient leans: N/A  Psychiatric Specialty Exam:  Presentation  General Appearance:  Appropriate for Environment  Eye Contact: Good  Speech: Clear and Coherent  Speech Volume: Normal  Handedness: Right   Mood and Affect  Mood: Euthymic  Affect: Appropriate   Thought Process  Thought Processes: Coherent  Descriptions of Associations:Intact  Orientation:Full (Time, Place and Person)  Thought Content:WDL  History of Schizophrenia/Schizoaffective disorder:No  Duration of Psychotic Symptoms:No data recorded Hallucinations:No data recorded Ideas of Reference:None  Suicidal Thoughts:No data recorded Homicidal Thoughts:No data recorded  Sensorium  Memory: Immediate Fair; Recent  Fair  Judgment: Fair  Insight: Fair   Community education officer  Concentration: Fair  Attention Span: Fair  Recall: [employer] of Knowledge: Fair  Language: Good   Psychomotor Activity  Psychomotor Activity:No data recorded  Assets  Assets: Communication Skills   Sleep  Sleep:No data recorded   Physical Exam: Physical Exam Vitals and nursing note reviewed.  Constitutional:      Appearance: Normal appearance. He is normal weight.  Neurological:     General: No focal deficit present.     Mental Status: He is alert and oriented to person, place, and time.  Psychiatric:        Attention and Perception: Attention and perception normal.  Mood and Affect: Mood and affect normal.        Speech: Speech normal.        Behavior: Behavior normal. Behavior is cooperative.        Thought Content: Thought content normal.        Cognition and Memory: Cognition and memory normal.        Judgment: Judgment normal.    Review of Systems  Constitutional: Negative.   HENT: Negative.    Eyes: Negative.   Respiratory: Negative.    Cardiovascular: Negative.   Gastrointestinal: Negative.   Genitourinary: Negative.   Musculoskeletal: Negative.   Skin: Negative.   Neurological: Negative.   Endo/Heme/Allergies: Negative.   Psychiatric/Behavioral: Negative.     Blood pressure (!) 156/67, pulse (!) 59, temperature 98.6 F (37 C), temperature source Oral, resp. rate 18, height '5\' 8"'$  (1.727 m), weight 67.2 kg, SpO2 100 %. Body mass index is 22.53 kg/m.   Treatment Plan Summary: Daily contact with patient to assess and evaluate symptoms and progress in treatment, Medication management, and Plan continue current medications.  Discharge Monday  Parks Ranger, DO 12/05/2022, 12:11 PM

## 2022-12-05 NOTE — B[facility] Group Notes (Signed)
[facility] Group Notes:  (Nursing/MHT/Case Management/Adjunct)  Date:  12/05/2022  Time:  5:11 PM  Type of Therapy:  Psychoeducational Skills  Participation Level:  Active  Participation Quality:  Appropriate and Attentive  Affect:  Appropriate  Cognitive:  Alert and Appropriate  Insight:  Appropriate  Engagement in Group:  Engaged  Modes of Intervention:  Activity and Discussion  Summary of Progress/Problems: We discussed how coping skills helped with their thoughts. We colored shamrocks and added 3-4 coping skills that would help them.   Hosie Spangle 12/05/2022, 5:11 PM

## 2022-12-05 NOTE — Group Note (Deleted)
Recreation Therapy Group Note   Group Topic:Problem Solving  Group Date: 12/05/2022 Start Time: 1400 End Time: 1455 Facilitators: Vilma Prader, LRT Location:  Dayroom       Affect/Mood: {RT BHH Affect/Mood:26271}   Participation Level: {RT [facility] Participation Level:26267}   Participation Quality: {RT [facility] Participation Quality:26268}   Behavior: {RT B[facility] Group Behavior:26269}   Speech/Thought Process: {RT B[facility] Speech/Thought:26276}   Insight: {RT BHH Insight:26272}   Judgement: {RT B[facility] Judgement:26278}   Modes of Intervention: {RT BHH Modes of Intervention:26277}   Patient Response to Interventions:  {RT BHH Patient Response to Intervention:26274}   Education Outcome:  {RT [place] Education Outcome:26279}   Clinical Observations/Individualized Feedback: *** was *** in their participation of session activities and group discussion. Pt identified ***   Plan: {RT B[facility] Tx [id number]   Vilma Prader, LRT,  12/05/2022 1:37 PM

## 2022-12-06 ENCOUNTER — Other Ambulatory Visit: Payer: Self-pay

## 2022-12-06 LAB — GLUCOSE, CAPILLARY
Glucose-Capillary: 90 mg/dL (ref 70–99)
Glucose-Capillary: 86 mg/dL (ref 70–99)
Glucose-Capillary: 162 mg/dL — ABNORMAL HIGH (ref 70–99)
Glucose-Capillary: 171 mg/dL — ABNORMAL HIGH (ref 70–99)

## 2022-12-06 NOTE — Progress Notes (Signed)
[facility] MD Progress Note  12/06/2022 11:56 AM Michael [name]  MRN:  [id number] Subjective: Fines is seen on rounds.  His mood is improving.  He is pleasant and cooperative.  He has been compliant with his medications.  No side effects.  Social work is looking for shelter.  He says that he slept well last night.  His appetite is good.  He denies any suicidal ideation. Principal Problem: Major depressive disorder, recurrent, severe without psychotic features ([place]) Diagnosis: Principal Problem:   Major depressive disorder, recurrent, severe without psychotic features ([place]) Active Problems:   Cocaine use disorder ([place])   MDD (major depressive disorder), recurrent episode, severe ([place])  Total Time spent with patient: 15 minutes  Past Psychiatric History:  Patient is not currently receiving any mental health or substance use services. Patient reports he was discharged from the hospital to [facility] in January, however they discharged him due to his health issues. Patient reports one previous psychiatric hospitalization a few years ago, due to a suicide attempt by cutting.       Past Medical History:  Past Medical History:  Diagnosis Date   A-fib ([place])    Anemia    Asthma    No PFTs, history of childhood asthma   CAD (coronary artery disease)    Cellulitis 04/2014   left facial   Cellulitis and abscess of toe of right foot 12/08/2019   Chondromalacia of medial femoral condyle    Left knee MRI 04/28/12: Chondromalacia of the medial femoral condyle with slight peripheral degeneration of the meniscocapsular junction of the medial meniscus; followed by sports medicine   Chronic kidney failure, stage 4 (severe) ([place])    Collagen vascular disease ([place])    Crack cocaine use    for 20+ years, has been enrolled in detox programs in the past   Depression    with history of hospitalization for suicidal ideation   Diabetes mellitus 2002   Diagnosed in 2002, started insulin in 2012    Gout    Headache(784.0)    CT head 08/2011: Periventricular and subcortical white matter hypodensities are most in keeping with chronic microangiopathic change   HIV infection ([place]) 08/2011   Followed by [name]   Hyperlipidemia    Hypertension    Pulmonary embolism [facility])     Past Surgical History:  Procedure Laterality Date   AMPUTATION Right 07/21/2019   Procedure: RIGHT SECOND TOE AMPUTATION;  Surgeon: [name], MD;  Location: [place];  Service: Orthopedics;  Laterality: Right;   BACK SURGERY     1988   BOWEL RESECTION     CARDIAC SURGERY     CERVICAL SPINE SURGERY     " rods in my neck "   CORONARY ARTERY BYPASS GRAFT     CORONARY STENT PLACEMENT     NM MYOCAR PERF WALL MOTION  12/27/2011   normal   SPINE SURGERY     Family History:  Family History  Problem Relation Age of Onset   Diabetes Mother    Hypertension Mother    Hyperlipidemia Mother    Diabetes Father    Cancer Father    Hypertension Father    Diabetes Brother    Heart disease Brother    Diabetes Sister    Colon cancer Neg Hx    Family Psychiatric  History: Unremarkable Social History:  Social History   Substance and Sexual Activity  Alcohol Use No   Alcohol/week: 4.0 standard drinks of alcohol  Types: 2 Cans of beer, 2 Shots of liquor per week     Social History   Substance and Sexual Activity  Drug Use Not Currently   Frequency: 4.0 times per week   Types: "Crack" cocaine, Cocaine   Comment: Recent use of crack    Social History   Socioeconomic History   Marital status: Widowed    Spouse name: Not on file   Number of children: 2   Years of education: 10   Highest education level: 12th grade  Occupational History    Employer: UNEMPLOYED    Comment: 04/2016  Tobacco Use   Smoking status: Never   Smokeless tobacco: Never  Vaping Use   Vaping Use: Never used  Substance and Sexual Activity   Alcohol use: No    Alcohol/week: 4.0 standard drinks of alcohol    Types: 2 Cans of  beer, 2 Shots of liquor per week   Drug use: Not Currently    Frequency: 4.0 times per week    Types: "Crack" cocaine, Cocaine    Comment: Recent use of crack   Sexual activity: Yes    Comment: DECLINED CONDOMS  Other Topics Concern   Not on file  Social History Narrative   Currently staying with a friend in [place].  Was staying @ local motel until a few days ago - left b/c of bed bugs.   P[facility] up [facility].  Not followed by a psychiatrist.   Social Determinants of Health   Financial Resource Strain: Medium Risk (09/16/2018)   Overall Financial Resource Strain (CARDIA)    Difficulty of Paying Living Expenses: Somewhat hard  Food Insecurity: No Food Insecurity (10/22/2022)   Hunger Vital Sign    Worried About Running Out of Food in the Last Year: Never true    [place] in the Last Year: Never true  Recent Concern: [place] Present (09/10/2022)   Hunger Vital Sign    Worried About Running Out of Food in the Last Year: Sometimes true    Ran Out of Food in the Last Year: Sometimes true  Transportation Needs: Unmet Transportation Needs (10/22/2022)   PRAPARE - Hydrologist (Medical): Yes    Lack of Transportation (Non-Medical): Yes  Physical Activity: Unknown (04/22/2019)   Exercise Vital Sign    Days of Exercise per Week: Not on file    Minutes of Exercise per Session: 0 min  Stress: Stress Concern Present (09/16/2018)   [place]    Feeling of Stress : Very much  Social Connections: Socially Isolated (09/16/2018)   Social Connection and Isolation Panel [NHANES]    Frequency of Communication with Friends and Family: Once a week    Frequency of Social Gatherings with Friends and Family: Never    Attends Religious Services: Never    Marine scientist or Organizations: No    Attends Archivist Meetings: Never    Marital Status: Widowed    Additional Social History:                         Sleep: Good  Appetite: Good  Current Medications: Current Facility-Administered Medications  Medication Dose Route Frequency Provider Last Rate Last Admin   acetaminophen (TYLENOL) tablet 650 mg  650 mg Oral Q6H PRN Merlyn Lot E, NP   650 mg at 12/06/22 0922   albuterol (VENTOLIN HFA)  108 (90 Base) MCG/ACT inhaler 1-2 puff  1-2 puff Inhalation Q6H PRN Merlyn Lot E, NP   2 puff at 12/03/22 0918   alum & mag hydroxide-simeth (MAALOX/MYLANTA) 200-200-20 MG/5ML suspension 30 mL  30 mL Oral Q4H PRN Mallie Darting, NP       atorvastatin (LIPITOR) tablet 40 mg  40 mg Oral Daily Merlyn Lot E, NP   40 mg at 12/06/22 0918   diltiazem (CARDIZEM CD) 24 hr capsule 120 mg  120 mg Oral Daily Merlyn Lot E, NP   120 mg at 12/06/22 0917   DULoxetine (CYMBALTA) DR capsule 20 mg  20 mg Oral Daily Patrecia Pour, NP   20 mg at 12/06/22 0918   fluticasone furoate-vilanterol (BREO ELLIPTA) 200-25 MCG/ACT 1 puff  1 puff Inhalation Daily Merlyn Lot E, NP   1 puff at 12/06/22 0917   gabapentin (NEURONTIN) capsule 100 mg  100 mg Oral TID PRN Patrecia Pour, NP   100 mg at 12/06/22 [id number]   insulin aspart (novoLOG) injection 0-5 Units  0-5 Units Subcutaneous QHS Merlyn Lot E, NP       insulin aspart (novoLOG) injection 0-9 Units  0-9 Units Subcutaneous TID WC Merlyn Lot E, NP   1 Units at 12/05/22 1653   isosorbide mononitrate (IMDUR) 24 hr tablet 15 mg  15 mg Oral Daily Merlyn Lot E, NP   15 mg at 12/06/22 0918   OLANZapine zydis (ZYPREXA) disintegrating tablet 5 mg  5 mg Oral Q8H PRN Mallie Darting, NP       And   LORazepam (ATIVAN) tablet 1 mg  1 mg Oral PRN Mallie Darting, NP       And   ziprasidone (GEODON) injection 20 mg  20 mg Intramuscular PRN Merlyn Lot E, NP       magnesium hydroxide (MILK OF MAGNESIA) suspension 30 mL  30 mL Oral Daily PRN Merlyn Lot E, NP   30 mL at 11/30/22 [id number]   multivitamin with minerals  tablet 1 tablet  1 tablet Oral Daily Parks Ranger, DO   1 tablet at 12/06/22 [id number]   polyethylene glycol (MIRALAX / GLYCOLAX) packet 17 g  17 g Oral BID Dorothe Pea, RPH   17 g at 12/06/22 [id number]   QUEtiapine (SEROQUEL) tablet 50 mg  50 mg Oral QHS Parks Ranger, DO   50 mg at 12/05/22 2141    Lab Results:  Results for orders placed or performed during the hospital encounter of 11/27/22 (from the past 48 hour(s))  Glucose, capillary     Status: Abnormal   Collection Time: 12/04/22  4:38 PM  Result Value Ref Range   Glucose-Capillary 115 (H) 70 - 99 mg/dL    Comment: Glucose reference range applies only to samples taken after fasting for at least 8 hours.   Comment 1 Notify RN   Glucose, capillary     Status: Abnormal   Collection Time: 12/04/22  8:38 PM  Result Value Ref Range   Glucose-Capillary 182 (H) 70 - 99 mg/dL    Comment: Glucose reference range applies only to samples taken after fasting for at least 8 hours.   Comment 1 Notify RN   Glucose, capillary     Status: None   Collection Time: 12/05/22  7:57 AM  Result Value Ref Range   Glucose-Capillary 98 70 - 99 mg/dL    Comment: Glucose reference range applies only to samples taken after fasting for at least 8  hours.  Glucose, capillary     Status: None   Collection Time: 12/05/22 11:43 AM  Result Value Ref Range   Glucose-Capillary 96 70 - 99 mg/dL    Comment: Glucose reference range applies only to samples taken after fasting for at least 8 hours.  Glucose, capillary     Status: Abnormal   Collection Time: 12/05/22  4:49 PM  Result Value Ref Range   Glucose-Capillary 144 (H) 70 - 99 mg/dL    Comment: Glucose reference range applies only to samples taken after fasting for at least 8 hours.  Glucose, capillary     Status: Abnormal   Collection Time: 12/05/22  7:50 PM  Result Value Ref Range   Glucose-Capillary 117 (H) 70 - 99 mg/dL    Comment: Glucose reference range applies only to samples taken  after fasting for at least 8 hours.  Glucose, capillary     Status: None   Collection Time: 12/06/22  7:51 AM  Result Value Ref Range   Glucose-Capillary 90 70 - 99 mg/dL    Comment: Glucose reference range applies only to samples taken after fasting for at least 8 hours.  Glucose, capillary     Status: None   Collection Time: 12/06/22 11:40 AM  Result Value Ref Range   Glucose-Capillary 86 70 - 99 mg/dL    Comment: Glucose reference range applies only to samples taken after fasting for at least 8 hours.    Blood Alcohol level:  Lab Results  Component Value Date   ETH <10 11/26/2022   ETH <10 [id number]    Metabolic Disorder Labs: Lab Results  Component Value Date   HGBA1C 6.8 (H) 09/30/2022   MPG 148 09/30/2022   MPG 174.29 07/02/2022   No results found for: "PROLACTIN" Lab Results  Component Value Date   CHOL 149 04/25/2021   TRIG 166 (H) 04/25/2021   HDL 57 04/25/2021   CHOLHDL 2.6 04/25/2021   VLDL 27 12/09/2019   LDLCALC 68 04/25/2021   LDLCALC 120 (H) 12/09/2019    Physical Findings: AIMS:  , ,  ,  ,    CIWA:    COWS:     Musculoskeletal: Strength & Muscle Tone: within normal limits Gait & Station: normal Patient leans: N/A  Psychiatric Specialty Exam:  Presentation  General Appearance:  Appropriate for Environment  Eye Contact: Good  Speech: Clear and Coherent  Speech Volume: Normal  Handedness: Right   Mood and Affect  Mood: Euthymic  Affect: Appropriate   Thought Process  Thought Processes: Coherent  Descriptions of Associations:Intact  Orientation:Full (Time, Place and Person)  Thought Content:WDL  History of Schizophrenia/Schizoaffective disorder:No  Duration of Psychotic Symptoms:No data recorded Hallucinations:No data recorded Ideas of Reference:None  Suicidal Thoughts:No data recorded Homicidal Thoughts:No data recorded  Sensorium  Memory: Immediate Fair; Recent  Fair  Judgment: Fair  Insight: Fair   Community education officer  Concentration: Fair  Attention Span: Fair  Recall: [employer] of Knowledge: Fair  Language: Good   Psychomotor Activity  Psychomotor Activity:No data recorded  Assets  Assets: Communication Skills   Sleep  Sleep:No data recorded   Physical Exam: Physical Exam Vitals and nursing note reviewed.  Constitutional:      Appearance: Normal appearance. He is normal weight.  Neurological:     General: No focal deficit present.     Mental Status: He is alert and oriented to person, place, and time.  Psychiatric:        Attention and Perception: Attention  and perception normal.        Mood and Affect: Mood and affect normal.        Speech: Speech normal.        Behavior: Behavior normal. Behavior is cooperative.        Thought Content: Thought content normal.        Cognition and Memory: Cognition and memory normal.        Judgment: Judgment is impulsive.    Review of Systems  Constitutional: Negative.   HENT: Negative.    Eyes: Negative.   Respiratory: Negative.    Cardiovascular: Negative.   Gastrointestinal: Negative.   Genitourinary: Negative.   Musculoskeletal: Negative.   Skin: Negative.   Neurological: Negative.   Endo/Heme/Allergies: Negative.   Psychiatric/Behavioral:  Positive for depression.    Blood pressure (!) 180/93, pulse 65, temperature 97.9 F (36.6 C), temperature source Oral, resp. rate 16, height '5\' 8"'$  (1.727 m), weight 67.2 kg, SpO2 100 %. Body mass index is 22.53 kg/m.   Treatment Plan Summary: Daily contact with patient to assess and evaluate symptoms and progress in treatment, Medication management, and Plan continue current medications.  Parks Ranger, DO 12/06/2022, 11:56 AM

## 2022-12-06 NOTE — Progress Notes (Signed)
   12/06/22 2013  Psych Admission Type (Psych Patients Only)  Admission Status Voluntary  Psychosocial Assessment  Patient Complaints None  Eye Contact Fair  Facial Expression Sad  Affect Sad  Speech Soft  Interaction Minimal  Motor Activity Slow  Appearance/Hygiene In scrubs  Behavior Characteristics Cooperative;Calm  Mood Depressed;Sad  Thought Process  Coherency WDL  Content WDL  Delusions None reported or observed  Perception WDL  Hallucination None reported or observed  Judgment WDL  Confusion None  Danger to Self  Current suicidal ideation? Denies  Agreement Not to Harm Self Yes  Description of Agreement verbal  Danger to Others  Danger to Others None reported or observed   Progress note   D: Pt seen at nurse's station. Pt denies SI, HI, AVH. Pt rates pain  7/10 as chronic pain on his left side and head. Pt rates anxiety  0/10 and depression  0/10. Pt stated he was a bit restless earlier in the day. Pt has PRNs but pain is still the same. Pt came out of his room to the dayroom for a short time. No other concerns noted at this time.  A: Pt provided support and encouragement. Pt given scheduled medication as prescribed. PRNs as appropriate. Q15 min checks for safety.   R: Pt safe on the unit. Will continue to monitor.

## 2022-12-06 NOTE — Group Note (Signed)
LCSW Group Therapy Note  Group Date: 12/06/2022 Start Time: 1510 End Time: 1545   Type of Therapy and Topic:  Group Therapy - How To Cope with Nervousness about Discharge   Participation Level:  Active   Description of Group This process group involved identification of patients' feelings about discharge. Some of them are scheduled to be discharged soon, while others are new admissions, but each of them was asked to share thoughts and feelings surrounding discharge from the hospital. One common theme was that they are excited at the prospect of going home, while another was that many of them are apprehensive about sharing why they were hospitalized. Patients were given the opportunity to discuss these feelings with their peers in preparation for discharge.  Therapeutic Goals  Patient will identify their overall feelings about pending discharge. Patient will think about how they might proactively address issues that they believe will once again arise once they get home (i.e. with parents). Patients will participate in discussion about having hope for change.   Summary of Patient Progress:  He was very active throughout the session. He demonstrated fair insight into the subject matter, and proved open to input from peers and feedback from [place]. He said he was "looking forward to leaving." He said "I just want to maintain my calm." He  was respectful of peers and participated throughout the entire session.   Therapeutic Modalities Cognitive Behavioral Therapy   Danese Dorsainvil [name], LCSWA 12/06/2022  3:47 PM

## 2022-12-06 NOTE — Progress Notes (Signed)
   12/06/22 1915  Vital Signs  Temp 98.7 F (37.1 C)  Temp Source Oral  Pulse Rate 74  Pulse Rate Source Monitor  Resp 18  BP (!) 183/83  BP Location Left Arm  BP Method Automatic  Patient Position (if appropriate) Lying  Oxygen Therapy  SpO2 100 %  O2 Device Room Air   Pt states he does have hypertension. Pt denying any symptoms of elevated BP beside his chronic generalized left sided pain and headache. Will give PRN medication for pain and reassess.

## 2022-12-06 NOTE — Group Note (Signed)
Date:  12/06/2022 Time:  4:44 PM  Group Topic/Focus:  Dimensions of Wellness:   The focus of this group is to introduce the topic of wellness and discuss the role each dimension of wellness plays in total health.    Participation Level:  Active  Participation Quality:  Appropriate and Attentive  Affect:  Appropriate  Cognitive:  Alert, Appropriate, and Oriented  Insight: Appropriate and Good  Engagement in Group:  Engaged  Modes of Intervention:  Activity, Discussion, and Socialization  Additional Comments:    Ladona Mow 12/06/2022, 4:44 PM

## 2022-12-06 NOTE — Group Note (Signed)
Recreation Therapy Group Note   Group Topic:Relaxation  Group Date: 12/06/2022 Start Time: 1400 End Time: 1450 Facilitators: Vilma Prader, LRT, CTRS Location:  Dayroom  Group Description: Mindfulness Body Scan. LRT asked pt their current level of stress and anxiety. LRT educated on the benefits of mindfulness and how it can apply to everyday life post-discharge. LRT and pt's followed along to an audio script of a "mindfulness body scan" video. LRT asked pt their level of stress and anxiety once the prompt was finished. LRT gave pts a education sheet on the benefits on mindfulness.   Affect/Mood: Flat   Participation Level: Minimal   Participation Quality: Independent   Behavior: Appropriate   Speech/Thought Process: Coherent   Insight: Limited   Judgement: Fair    Modes of Intervention: Activity and Education   Patient Response to Interventions:  Receptive   Education Outcome:  Acknowledges education   Clinical Observations/Individualized Feedback: Michael [name] was somewhat in their participation of session activities and group discussion. Pt identified that their stress and anxiety level was a 6 out of 10. After the session, pt shared that their levels "are about the same". Pt was seen with his eyes closed while participating in the meditation.    Plan: Continue to engage patient in RT group sessions 2-3x/week.   Vilma Prader, LRT, CTRS 12/06/2022 3:01 PM

## 2022-12-06 NOTE — BHH Counselor (Signed)
CSW discussed discharge plans with pt.   He stated he was interested in going back to [place]. He said that he could stay at a hotel and then had a friend he could potentially stay with on a more long term basis.   He discussed that he was interested in referral at [facility] on [address].   CSW will schedule follow up for pt and provide assistance with obtaining transportation.   Jaleah Lefevre [place], MSW, LCSW-A 3/1/20244:14 PM

## 2022-12-06 NOTE — Plan of Care (Signed)
Pt endorses anxiety/depression at this time. Pt denies SI/HI/AVH however reports 7/10 chronic generalized pain at this time. Prn pain management medications given and not found helpful, MD notified and aware. Pt is calm and cooperative. Pt is medication compliant. Pt provided with support and encouragement. Pt monitored q15 minutes for safety per unit policy. Plan of care ongoing.   Problem: Education: Goal: Knowledge of General Education information will improve Description: Including pain rating scale, medication(s)/side effects and non-pharmacologic comfort measures Outcome: Progressing   Problem: Self-Concept: Goal: Will verbalize positive feelings about self Outcome: Not Progressing

## 2022-12-07 LAB — GLUCOSE, CAPILLARY
Glucose-Capillary: 82 mg/dL (ref 70–99)
Glucose-Capillary: 101 mg/dL — ABNORMAL HIGH (ref 70–99)
Glucose-Capillary: 93 mg/dL (ref 70–99)
Glucose-Capillary: 141 mg/dL — ABNORMAL HIGH (ref 70–99)

## 2022-12-07 MED ORDER — GABAPENTIN 300 MG PO CAPS: 300.0000 mg | ORAL_CAPSULE | Freq: Three times a day (TID) | ORAL | Status: DC | PRN

## 2022-12-07 NOTE — Plan of Care (Signed)
Pt endorses anxiety/depression at this time. Pt denies SI/HI/AVH however endorses 7/10 chronic pain at this time, prn medications given. Pt is calm and cooperative. Pt is medication compliant. Pt provided with support and encouragement. Pt monitored q15 minutes for safety per unit policy. Plan of care ongoing.   Pt has attended all groups with the exception of going outside. Pt stated it was too chilly for him. Pt instead opted to watch television in the dayroom.   Pt's clothing has been washed by staff in preparation for discharge. Pt requested to make a phone call to his bank and was in need of his ID and bank card. Pt received them long enough to make his phone call while being observed by staff. Then pt's ID, and bank card were secured in a plastic bio bag stapled shut and placed inside plastic pt belonging bag with a series of sealed envelopes and a few receipts. Pt is aware of the placement of his ID and bank card and is satisfied with it.    Problem: Coping: Goal: Level of anxiety will decrease Outcome: Not Progressing   Problem: Pain Managment: Goal: General experience of comfort will improve Outcome: Not Progressing

## 2022-12-07 NOTE — Progress Notes (Signed)
[facility] MD Progress Note  12/07/2022 2:16 PM Michael [name]  MRN:  [id number] Subjective: Follow-up 72 year old man with depression.  Patient reports mood is so-so.  No worse.  No active suicidal ideation.  Complains of joint pain.  Stays withdrawn and does not participate very much with others. Principal Problem: Major depressive disorder, recurrent, severe without psychotic features ([place]) Diagnosis: Principal Problem:   Major depressive disorder, recurrent, severe without psychotic features ([place]) Active Problems:   Cocaine use disorder ([name])   MDD (major depressive disorder), recurrent episode, severe ([place])  Total Time spent with patient: 30 minutes  Past Psychiatric History: Past history of depression and multiple medical problems  Past Medical History:  Past Medical History:  Diagnosis Date   A-fib ([place])    Anemia    Asthma    No PFTs, history of childhood asthma   CAD (coronary artery disease)    Cellulitis 04/2014   left facial   Cellulitis and abscess of toe of right foot 12/08/2019   Chondromalacia of medial femoral condyle    Left knee MRI 04/28/12: Chondromalacia of the medial femoral condyle with slight peripheral degeneration of the meniscocapsular junction of the medial meniscus; followed by sports medicine   Chronic kidney failure, stage 4 (severe) ([place])    Collagen vascular disease ([name])    Crack cocaine use    for 20+ years, has been enrolled in detox programs in the past   Depression    with history of hospitalization for suicidal ideation   Diabetes mellitus 2002   Diagnosed in 2002, started insulin in 2012   Gout    Headache(784.0)    CT head 08/2011: Periventricular and subcortical white matter hypodensities are most in keeping with chronic microangiopathic change   HIV infection ([place]) 08/2011   Followed by [name]   Hyperlipidemia    Hypertension    Pulmonary embolism [facility])     Past Surgical History:  Procedure Laterality Date   AMPUTATION Right  07/21/2019   Procedure: RIGHT SECOND TOE AMPUTATION;  Surgeon: [name], MD;  Location: [place];  Service: Orthopedics;  Laterality: Right;   BACK SURGERY     1988   BOWEL RESECTION     CARDIAC SURGERY     CERVICAL SPINE SURGERY     " rods in my neck "   CORONARY ARTERY BYPASS GRAFT     CORONARY STENT PLACEMENT     NM MYOCAR PERF WALL MOTION  12/27/2011   normal   SPINE SURGERY     Family History:  Family History  Problem Relation Age of Onset   Diabetes Mother    Hypertension Mother    Hyperlipidemia Mother    Diabetes Father    Cancer Father    Hypertension Father    Diabetes Brother    Heart disease Brother    Diabetes Sister    Colon cancer Neg Hx    Family Psychiatric  History: See previous Social History:  Social History   Substance and Sexual Activity  Alcohol Use No   Alcohol/week: 4.0 standard drinks of alcohol   Types: 2 Cans of beer, 2 Shots of liquor per week     Social History   Substance and Sexual Activity  Drug Use Not Currently   Frequency: 4.0 times per week   Types: "Crack" cocaine, Cocaine   Comment: Recent use of crack    Social History   Socioeconomic History   Marital status: Widowed    Spouse name: Not  on file   Number of children: 2   Years of education: 59   Highest education level: 12th grade  Occupational History    Employer: UNEMPLOYED    Comment: 04/2016  Tobacco Use   Smoking status: Never   Smokeless tobacco: Never  Vaping Use   Vaping Use: Never used  Substance and Sexual Activity   Alcohol use: No    Alcohol/week: 4.0 standard drinks of alcohol    Types: 2 Cans of beer, 2 Shots of liquor per week   Drug use: Not Currently    Frequency: 4.0 times per week    Types: "Crack" cocaine, Cocaine    Comment: Recent use of crack   Sexual activity: Yes    Comment: DECLINED CONDOMS  Other Topics Concern   Not on file  Social History Narrative   Currently staying with a friend in [place].  Was staying @ local motel until a  few days ago - left b/c of bed bugs.   P[facility] up from [facility].  Not followed by a psychiatrist.   Social Determinants of Health   Financial Resource Strain: Medium Risk (09/16/2018)   Overall Financial Resource Strain (CARDIA)    Difficulty of Paying Living Expenses: Somewhat hard  Food Insecurity: No Food Insecurity (10/22/2022)   Hunger Vital Sign    Worried About Running Out of Food in the Last Year: Never true    [place] in the Last Year: Never true  Recent Concern: [place] Present (09/10/2022)   Hunger Vital Sign    Worried About Running Out of Food in the Last Year: Sometimes true    Ran Out of Food in the Last Year: Sometimes true  Transportation Needs: Unmet Transportation Needs (10/22/2022)   PRAPARE - Hydrologist (Medical): Yes    Lack of Transportation (Non-Medical): Yes  Physical Activity: Unknown (04/22/2019)   Exercise Vital Sign    Days of Exercise per Week: Not on file    Minutes of Exercise per Session: 0 min  Stress: Stress Concern Present (09/16/2018)   [place]    Feeling of Stress : Very much  Social Connections: Socially Isolated (09/16/2018)   Social Connection and Isolation Panel [NHANES]    Frequency of Communication with Friends and Family: Once a week    Frequency of Social Gatherings with Friends and Family: Never    Attends Religious Services: Never    Marine scientist or Organizations: No    Attends Archivist Meetings: Never    Marital Status: Widowed   Additional Social History:                         Sleep: Fair  Appetite:  Fair  Current Medications: Current Facility-Administered Medications  Medication Dose Route Frequency Provider Last Rate Last Admin   acetaminophen (TYLENOL) tablet 650 mg  650 mg Oral Q6H PRN Merlyn Lot E, NP   650 mg at 12/07/22 0910   albuterol (VENTOLIN  HFA) 108 (90 Base) MCG/ACT inhaler 1-2 puff  1-2 puff Inhalation Q6H PRN Merlyn Lot E, NP   2 puff at 12/03/22 0918   alum & mag hydroxide-simeth (MAALOX/MYLANTA) 200-200-20 MG/5ML suspension 30 mL  30 mL Oral Q4H PRN Mallie Darting, NP       atorvastatin (LIPITOR) tablet 40 mg  40 mg Oral Daily Mallie Darting, NP  40 mg at 12/07/22 0910   diltiazem (CARDIZEM CD) 24 hr capsule 120 mg  120 mg Oral Daily Merlyn Lot E, NP   120 mg at 12/07/22 0911   DULoxetine (CYMBALTA) DR capsule 20 mg  20 mg Oral Daily Patrecia Pour, NP   20 mg at 12/07/22 0911   fluticasone furoate-vilanterol (BREO ELLIPTA) 200-25 MCG/ACT 1 puff  1 puff Inhalation Daily Merlyn Lot E, NP   1 puff at 12/07/22 0910   gabapentin (NEURONTIN) capsule 300 mg  300 mg Oral TID PRN Geneive Sandstrom, Madie Reno, MD       insulin aspart (novoLOG) injection 0-5 Units  0-5 Units Subcutaneous QHS Merlyn Lot E, NP       insulin aspart (novoLOG) injection 0-9 Units  0-9 Units Subcutaneous TID WC Merlyn Lot E, NP   2 Units at 12/06/22 1638   isosorbide mononitrate (IMDUR) 24 hr tablet 15 mg  15 mg Oral Daily Merlyn Lot E, NP   15 mg at 12/07/22 0911   OLANZapine zydis (ZYPREXA) disintegrating tablet 5 mg  5 mg Oral Q8H PRN Mallie Darting, NP       And   LORazepam (ATIVAN) tablet 1 mg  1 mg Oral PRN Mallie Darting, NP       And   ziprasidone (GEODON) injection 20 mg  20 mg Intramuscular PRN Merlyn Lot E, NP       magnesium hydroxide (MILK OF MAGNESIA) suspension 30 mL  30 mL Oral Daily PRN Merlyn Lot E, NP   30 mL at 11/30/22 [id number]   multivitamin with minerals tablet 1 tablet  1 tablet Oral Daily Parks Ranger, DO   1 tablet at 12/07/22 0911   polyethylene glycol (MIRALAX / GLYCOLAX) packet 17 g  17 g Oral BID Dorothe Pea, RPH   17 g at 12/07/22 0910   QUEtiapine (SEROQUEL) tablet 50 mg  50 mg Oral QHS Parks Ranger, DO   50 mg at 12/06/22 2106    Lab Results:  Results for orders placed or performed  during the hospital encounter of 11/27/22 (from the past 48 hour(s))  Glucose, capillary     Status: Abnormal   Collection Time: 12/05/22  4:49 PM  Result Value Ref Range   Glucose-Capillary 144 (H) 70 - 99 mg/dL    Comment: Glucose reference range applies only to samples taken after fasting for at least 8 hours.  Glucose, capillary     Status: Abnormal   Collection Time: 12/05/22  7:50 PM  Result Value Ref Range   Glucose-Capillary 117 (H) 70 - 99 mg/dL    Comment: Glucose reference range applies only to samples taken after fasting for at least 8 hours.  Glucose, capillary     Status: None   Collection Time: 12/06/22  7:51 AM  Result Value Ref Range   Glucose-Capillary 90 70 - 99 mg/dL    Comment: Glucose reference range applies only to samples taken after fasting for at least 8 hours.  Glucose, capillary     Status: None   Collection Time: 12/06/22 11:40 AM  Result Value Ref Range   Glucose-Capillary 86 70 - 99 mg/dL    Comment: Glucose reference range applies only to samples taken after fasting for at least 8 hours.  Glucose, capillary     Status: Abnormal   Collection Time: 12/06/22  4:19 PM  Result Value Ref Range   Glucose-Capillary 162 (H) 70 - 99 mg/dL    Comment: Glucose  reference range applies only to samples taken after fasting for at least 8 hours.   Comment 1 Notify RN   Glucose, capillary     Status: Abnormal   Collection Time: 12/06/22  7:20 PM  Result Value Ref Range   Glucose-Capillary 171 (H) 70 - 99 mg/dL    Comment: Glucose reference range applies only to samples taken after fasting for at least 8 hours.  Glucose, capillary     Status: None   Collection Time: 12/07/22  7:35 AM  Result Value Ref Range   Glucose-Capillary 82 70 - 99 mg/dL    Comment: Glucose reference range applies only to samples taken after fasting for at least 8 hours.   Comment 1 Notify RN   Glucose, capillary     Status: Abnormal   Collection Time: 12/07/22 11:44 AM  Result Value Ref  Range   Glucose-Capillary 101 (H) 70 - 99 mg/dL    Comment: Glucose reference range applies only to samples taken after fasting for at least 8 hours.   Comment 1 Notify RN     Blood Alcohol level:  Lab Results  Component Value Date   ETH <10 11/26/2022   ETH <10 [id number]    Metabolic Disorder Labs: Lab Results  Component Value Date   HGBA1C 6.8 (H) 09/30/2022   MPG 148 09/30/2022   MPG 174.29 07/02/2022   No results found for: "PROLACTIN" Lab Results  Component Value Date   CHOL 149 04/25/2021   TRIG 166 (H) 04/25/2021   HDL 57 04/25/2021   CHOLHDL 2.6 04/25/2021   VLDL 27 12/09/2019   LDLCALC 68 04/25/2021   LDLCALC 120 (H) 12/09/2019    Physical Findings: AIMS:  , ,  ,  ,    CIWA:    COWS:     Musculoskeletal: Strength & Muscle Tone: within normal limits Gait & Station: normal Patient leans: N/A  Psychiatric Specialty Exam:  Presentation  General Appearance:  Appropriate for Environment  Eye Contact: Good  Speech: Clear and Coherent  Speech Volume: Normal  Handedness: Right   Mood and Affect  Mood: Euthymic  Affect: Appropriate   Thought Process  Thought Processes: Coherent  Descriptions of Associations:Intact  Orientation:Full (Time, Place and Person)  Thought Content:WDL  History of Schizophrenia/Schizoaffective disorder:No  Duration of Psychotic Symptoms:No data recorded Hallucinations:No data recorded Ideas of Reference:None  Suicidal Thoughts:No data recorded Homicidal Thoughts:No data recorded  Sensorium  Memory: Immediate Fair; Recent Fair  Judgment: Fair  Insight: Fair   Community education officer  Concentration: Fair  Attention Span: Fair  Recall: [employer] of Knowledge: Fair  Language: Good   Psychomotor Activity  Psychomotor Activity:No data recorded  Assets  Assets: Communication Skills   Sleep  Sleep:No data recorded   Physical Exam: Physical Exam Vitals and nursing note  reviewed.  Constitutional:      Appearance: Normal appearance.  HENT:     Head: Normocephalic and atraumatic.     Mouth/Throat:     Pharynx: Oropharynx is clear.  Eyes:     Pupils: Pupils are equal, round, and reactive to light.  Cardiovascular:     Rate and Rhythm: Normal rate and regular rhythm.  Pulmonary:     Effort: Pulmonary effort is normal.     Breath sounds: Normal breath sounds.  Abdominal:     General: Abdomen is flat.     Palpations: Abdomen is soft.  Musculoskeletal:        General: Normal range of motion.  Skin:  General: Skin is warm and dry.  Neurological:     General: No focal deficit present.     Mental Status: He is alert. Mental status is at baseline.  Psychiatric:        Attention and Perception: Attention normal.        Mood and Affect: Mood is depressed.        Speech: Speech normal.        Behavior: Behavior normal.        Thought Content: Thought content normal.        Cognition and Memory: Cognition normal.    Review of Systems  Constitutional: Negative.   HENT: Negative.    Eyes: Negative.   Respiratory: Negative.    Cardiovascular: Negative.   Gastrointestinal: Negative.   Musculoskeletal:  Positive for back pain and joint pain.  Skin: Negative.   Neurological: Negative.   Psychiatric/Behavioral:  Positive for depression. Negative for suicidal ideas.    Blood pressure (!) 158/81, pulse (!) 58, temperature 97.7 F (36.5 C), temperature source Oral, resp. rate 16, height '5\' 8"'$  (1.727 m), weight 67.2 kg, SpO2 100 %. Body mass index is 22.53 kg/m.   Treatment Plan Summary: Medication management and Plan no change to medication for depression.  Increased dose of gabapentin as needed at request of nursing.  Patient agrees to give it a try for his joint pain.  Encourage group attendance and encouraged him to be more open discussing symptoms.  Alethia Berthold, MD 12/07/2022, 2:16 PM

## 2022-12-08 LAB — URINALYSIS, COMPLETE (UACMP) WITH MICROSCOPIC
Bacteria, UA: NONE SEEN
Bilirubin Urine: NEGATIVE
Glucose, UA: 50 mg/dL — AB
Hgb urine dipstick: NEGATIVE
Ketones, ur: NEGATIVE mg/dL
Leukocytes,Ua: NEGATIVE
Nitrite: NEGATIVE
Protein, ur: >=300 mg/dL — AB
Specific Gravity, Urine: 1.014 (ref 1.005–1.030)
Squamous Epithelial / HPF: NONE SEEN /HPF (ref 0–5)
pH: 6 (ref 5.0–8.0)

## 2022-12-08 LAB — GLUCOSE, CAPILLARY
Glucose-Capillary: 90 mg/dL (ref 70–99)
Glucose-Capillary: 97 mg/dL (ref 70–99)
Glucose-Capillary: 99 mg/dL (ref 70–99)
Glucose-Capillary: 167 mg/dL — ABNORMAL HIGH (ref 70–99)

## 2022-12-08 NOTE — Plan of Care (Signed)
  Problem: Education: Goal: Knowledge of General Education information will improve Description: Including pain rating scale, medication(s)/side effects and non-pharmacologic comfort measures Outcome: Progressing   Problem: Health Behavior/Discharge Planning: Goal: Ability to manage health-related needs will improve Outcome: Progressing   Problem: Clinical Measurements: Goal: Ability to maintain clinical measurements within normal limits will improve Outcome: Progressing Goal: Will remain free from infection Outcome: Progressing Goal: Diagnostic test results will improve Outcome: Progressing Goal: Respiratory complications will improve Outcome: Progressing Goal: Cardiovascular complication will be avoided Outcome: Progressing   Problem: Activity: Goal: Risk for activity intolerance will decrease Outcome: Progressing   Problem: Nutrition: Goal: Adequate nutrition will be maintained Outcome: Progressing   Problem: Coping: Goal: Level of anxiety will decrease Outcome: Progressing   Problem: Elimination: Goal: Will not experience complications related to bowel motility Outcome: Progressing   

## 2022-12-08 NOTE — Group Note (Signed)
Date:  12/08/2022 Time:  11:20 AM  Group Topic/Focus:  Conflict Resolution:   The focus of this group is to discuss the conflict resolution process and how it may be used upon discharge. Healthy Communication:   The focus of this group is to discuss communication, barriers to communication, as well as healthy ways to communicate with others.    Participation Level:  Minimal  Participation Quality:  Attentive  Affect:  Appropriate  Cognitive:  Appropriate  Insight: Good  Engagement in Group:  Limited  Modes of Intervention:  Discussion  Additional Comments:    Avis Epley 12/08/2022, 11:20 AM

## 2022-12-08 NOTE — Progress Notes (Signed)
D- Patient alert and oriented x 4. Patient presents with a pleasant and flat mood and affect. Patient denies SI, HI, AVH, and pain. Patient denies anxiety and depression. Patient states his goal is to discharge tomorrow.   A- Scheduled medications administered to patient, per MD orders. Support and encouragement provided.  Routine safety checks conducted every 15 minutes.  Patient informed to notify staff with problems or concerns.  R- No adverse drug reactions noted. Patient contracts for safety at this time. Patient compliant with medications and treatment plan. Patient receptive, calm, and cooperative. Patient interacts well with others on the unit. Patient participated in groups. Patient remains safe at this time.   12/08/22 0900  Charting Type  Charting Type Shift assessment  Safety Check Verification  Has the RN verified the 15 minute safety check completion? Yes  Neurological  Neuro (WDL) WDL  Orientation Level Oriented X4  Cognition Appropriate at baseline  Speech Clear  Neuro Symptoms Anxiety  Neuro symptoms relieved by Rest;Relaxation techniques (Comment)  HEENT  HEENT (WDL) X  Teeth Poor dental hygiene  Voice Clear  Lips Symmetrical  Respiratory  Respiratory (WDL) X  Respiratory Pattern Regular;Unlabored  Chest Assessment Chest expansion symmetrical  Cardiac  Cardiac (WDL) X  Vascular  Vascular (WDL) WDL  Integumentary  Integumentary (WDL) X  Braden Scale (Ages 8 and up)  Sensory Perceptions 4  Moisture 4  Activity 3  Mobility 3  Nutrition 3  Friction and Shear 3  Braden Scale Score 20  Musculoskeletal  Musculoskeletal (WDL) X  Assistive Device Front wheel walker  Weight Bearing Restrictions No  Gastrointestinal  Gastrointestinal (WDL) WDL  Last BM Date  12/07/22  GU Assessment  Genitourinary (WDL) WDL  Neurological  Level of Consciousness Alert

## 2022-12-08 NOTE — Group Note (Signed)
Date:  12/08/2022 Time:  3:08 PM  Group Topic/Focus:  Developing a Wellness Toolbox:   The focus of this group is to help patients develop a "wellness toolbox" with skills and strategies to promote recovery upon discharge. Healthy Communication:   The focus of this group is to discuss communication, barriers to communication, as well as healthy ways to communicate with others. Making Healthy Choices:   The focus of this group is to help patients identify negative/unhealthy choices they were using prior to admission and identify positive/healthier coping strategies to replace them upon discharge. Overcoming Stress:   The focus of this group is to define stress and help patients assess their triggers. Self Care:   The focus of this group is to help patients understand the importance of self-care in order to improve or restore emotional, physical, spiritual, interpersonal, and financial health.    Participation Level:  Active  Participation Quality:  Appropriate, Attentive, and Sharing  Affect:  Appropriate  Cognitive:  Alert and Appropriate  Insight: Good  Engagement in Group:  Engaged and Supportive  Modes of Intervention:  Activity, Discussion, Exploration, Socialization, and Support  Additional Comments:    Avis Epley 12/08/2022, 3:08 PM

## 2022-12-08 NOTE — Progress Notes (Signed)
[facility] MD Progress Note  12/08/2022 1:32 PM Michael [name]  MRN:  [id number] Subjective: Follow-up patient with major depression.  Patient reports he is having some left-sided flank pain and thinks it is "kidney pain".  Mood stable.  Anxious but not severely depressed no suicidal ideation. Principal Problem: Major depressive disorder, recurrent, severe without psychotic features ([name]) Diagnosis: Principal Problem:   Major depressive disorder, recurrent, severe without psychotic features ([name]) Active Problems:   Cocaine use disorder ([name])   MDD (major depressive disorder), recurrent episode, severe ([name])  Total Time spent with patient: 30 minutes  Past Psychiatric History: Past history of depression and chronic anxiety cocaine use  Past Medical History:  Past Medical History:  Diagnosis Date   A-fib ([name])    Anemia    Asthma    No PFTs, history of childhood asthma   CAD (coronary artery disease)    Cellulitis 04/2014   left facial   Cellulitis and abscess of toe of right foot 12/08/2019   Chondromalacia of medial femoral condyle    Left knee MRI 04/28/12: Chondromalacia of the medial femoral condyle with slight peripheral degeneration of the meniscocapsular junction of the medial meniscus; followed by sports medicine   Chronic kidney failure, stage 4 (severe) ([name])    Collagen vascular disease ([name])    Crack cocaine use    for 20+ years, has been enrolled in detox programs in the past   Depression    with history of hospitalization for suicidal ideation   Diabetes mellitus 2002   Diagnosed in 2002, started insulin in 2012   Gout    Headache(784.0)    CT head 08/2011: Periventricular and subcortical white matter hypodensities are most in keeping with chronic microangiopathic change   HIV infection ([name]) 08/2011   Followed by [name]   Hyperlipidemia    Hypertension    Pulmonary embolism [facility])     Past Surgical History:  Procedure Laterality Date   AMPUTATION Right  07/21/2019   Procedure: RIGHT SECOND TOE AMPUTATION;  Surgeon: [name], MD;  Location: [name];  Service: Orthopedics;  Laterality: Right;   BACK SURGERY     1988   BOWEL RESECTION     CARDIAC SURGERY     CERVICAL SPINE SURGERY     " rods in my neck "   CORONARY ARTERY BYPASS GRAFT     CORONARY STENT PLACEMENT     NM MYOCAR PERF WALL MOTION  12/27/2011   normal   SPINE SURGERY     Family History:  Family History  Problem Relation Age of Onset   Diabetes Mother    Hypertension Mother    Hyperlipidemia Mother    Diabetes Father    Cancer Father    Hypertension Father    Diabetes Brother    Heart disease Brother    Diabetes Sister    Colon cancer Neg Hx    Family Psychiatric  History: See previous Social History:  Social History   Substance and Sexual Activity  Alcohol Use No   Alcohol/week: 4.0 standard drinks of alcohol   Types: 2 Cans of beer, 2 Shots of liquor per week     Social History   Substance and Sexual Activity  Drug Use Not Currently   Frequency: 4.0 times per week   Types: "Crack" cocaine, Cocaine   Comment: Recent use of crack    Social History   Socioeconomic History   Marital status: Widowed    Spouse name: Not on  file   Number of children: 2   Years of education: 42   Highest education level: 12th grade  Occupational History    Employer: UNEMPLOYED    Comment: 04/2016  Tobacco Use   Smoking status: Never   Smokeless tobacco: Never  Vaping Use   Vaping Use: Never used  Substance and Sexual Activity   Alcohol use: No    Alcohol/week: 4.0 standard drinks of alcohol    Types: 2 Cans of beer, 2 Shots of liquor per week   Drug use: Not Currently    Frequency: 4.0 times per week    Types: "Crack" cocaine, Cocaine    Comment: Recent use of crack   Sexual activity: Yes    Comment: DECLINED CONDOMS  Other Topics Concern   Not on file  Social History Narrative   Currently staying with a friend in [place].  Was staying @ local motel until a  few days ago - left b/c of bed bugs.   P[facility] up from [facility].  Not followed by a psychiatrist.   Social Determinants of Health   Financial Resource Strain: Medium Risk (09/16/2018)   Overall Financial Resource Strain (CARDIA)    Difficulty of Paying Living Expenses: Somewhat hard  Food Insecurity: No Food Insecurity (10/22/2022)   Hunger Vital Sign    Worried About Running Out of Food in the Last Year: Never true    [place] in the Last Year: Never true  Recent Concern: [name] Present (09/10/2022)   Hunger Vital Sign    Worried About Running Out of Food in the Last Year: Sometimes true    Ran Out of Food in the Last Year: Sometimes true  Transportation Needs: Unmet Transportation Needs (10/22/2022)   PRAPARE - Hydrologist (Medical): Yes    Lack of Transportation (Non-Medical): Yes  Physical Activity: Unknown (04/22/2019)   Exercise Vital Sign    Days of Exercise per Week: Not on file    Minutes of Exercise per Session: 0 min  Stress: Stress Concern Present (09/16/2018)   [name]    Feeling of Stress : Very much  Social Connections: Socially Isolated (09/16/2018)   Social Connection and Isolation Panel [NHANES]    Frequency of Communication with Friends and Family: Once a week    Frequency of Social Gatherings with Friends and Family: Never    Attends Religious Services: Never    Marine scientist or Organizations: No    Attends Archivist Meetings: Never    Marital Status: Widowed   Additional Social History:                         Sleep: Fair  Appetite:  Fair  Current Medications: Current Facility-Administered Medications  Medication Dose Route Frequency Provider Last Rate Last Admin   acetaminophen (TYLENOL) tablet 650 mg  650 mg Oral Q6H PRN Merlyn Lot E, NP   650 mg at 12/07/22 0910   albuterol (VENTOLIN  HFA) 108 (90 Base) MCG/ACT inhaler 1-2 puff  1-2 puff Inhalation Q6H PRN Merlyn Lot E, NP   2 puff at 12/03/22 0918   alum & mag hydroxide-simeth (MAALOX/MYLANTA) 200-200-20 MG/5ML suspension 30 mL  30 mL Oral Q4H PRN Mallie Darting, NP       atorvastatin (LIPITOR) tablet 40 mg  40 mg Oral Daily Mallie Darting, NP  40 mg at 12/08/22 0904   diltiazem (CARDIZEM CD) 24 hr capsule 120 mg  120 mg Oral Daily Merlyn Lot E, NP   120 mg at 12/08/22 0904   DULoxetine (CYMBALTA) DR capsule 20 mg  20 mg Oral Daily Patrecia Pour, NP   20 mg at 12/08/22 0903   fluticasone furoate-vilanterol (BREO ELLIPTA) 200-25 MCG/ACT 1 puff  1 puff Inhalation Daily Merlyn Lot E, NP   1 puff at 12/08/22 0848   gabapentin (NEURONTIN) capsule 300 mg  300 mg Oral TID PRN Ilsa Bonello, Madie Reno, MD       insulin aspart (novoLOG) injection 0-5 Units  0-5 Units Subcutaneous QHS Merlyn Lot E, NP       insulin aspart (novoLOG) injection 0-9 Units  0-9 Units Subcutaneous TID WC Merlyn Lot E, NP   2 Units at 12/06/22 1638   isosorbide mononitrate (IMDUR) 24 hr tablet 15 mg  15 mg Oral Daily Merlyn Lot E, NP   15 mg at 12/08/22 0904   OLANZapine zydis (ZYPREXA) disintegrating tablet 5 mg  5 mg Oral Q8H PRN Mallie Darting, NP       And   LORazepam (ATIVAN) tablet 1 mg  1 mg Oral PRN Mallie Darting, NP       And   ziprasidone (GEODON) injection 20 mg  20 mg Intramuscular PRN Merlyn Lot E, NP       magnesium hydroxide (MILK OF MAGNESIA) suspension 30 mL  30 mL Oral Daily PRN Merlyn Lot E, NP   30 mL at 11/30/22 [id number]   multivitamin with minerals tablet 1 tablet  1 tablet Oral Daily Parks Ranger, DO   1 tablet at 12/08/22 [id number]   polyethylene glycol (MIRALAX / GLYCOLAX) packet 17 g  17 g Oral BID Dorothe Pea, RPH   17 g at 12/08/22 [id number]   QUEtiapine (SEROQUEL) tablet 50 mg  50 mg Oral QHS Parks Ranger, DO   50 mg at 12/07/22 2128    Lab Results:  Results for orders placed or performed  during the hospital encounter of 11/27/22 (from the past 48 hour(s))  Glucose, capillary     Status: Abnormal   Collection Time: 12/06/22  4:19 PM  Result Value Ref Range   Glucose-Capillary 162 (H) 70 - 99 mg/dL    Comment: Glucose reference range applies only to samples taken after fasting for at least 8 hours.   Comment 1 Notify RN   Glucose, capillary     Status: Abnormal   Collection Time: 12/06/22  7:20 PM  Result Value Ref Range   Glucose-Capillary 171 (H) 70 - 99 mg/dL    Comment: Glucose reference range applies only to samples taken after fasting for at least 8 hours.  Glucose, capillary     Status: None   Collection Time: 12/07/22  7:35 AM  Result Value Ref Range   Glucose-Capillary 82 70 - 99 mg/dL    Comment: Glucose reference range applies only to samples taken after fasting for at least 8 hours.   Comment 1 Notify RN   Glucose, capillary     Status: Abnormal   Collection Time: 12/07/22 11:44 AM  Result Value Ref Range   Glucose-Capillary 101 (H) 70 - 99 mg/dL    Comment: Glucose reference range applies only to samples taken after fasting for at least 8 hours.   Comment 1 Notify RN   Glucose, capillary     Status: None   Collection Time: 12/07/22  4:41 PM  Result Value Ref Range   Glucose-Capillary 93 70 - 99 mg/dL    Comment: Glucose reference range applies only to samples taken after fasting for at least 8 hours.  Glucose, capillary     Status: Abnormal   Collection Time: 12/07/22  8:54 PM  Result Value Ref Range   Glucose-Capillary 141 (H) 70 - 99 mg/dL    Comment: Glucose reference range applies only to samples taken after fasting for at least 8 hours.  Glucose, capillary     Status: None   Collection Time: 12/08/22  7:47 AM  Result Value Ref Range   Glucose-Capillary 90 70 - 99 mg/dL    Comment: Glucose reference range applies only to samples taken after fasting for at least 8 hours.  Glucose, capillary     Status: None   Collection Time: 12/08/22 11:35 AM   Result Value Ref Range   Glucose-Capillary 97 70 - 99 mg/dL    Comment: Glucose reference range applies only to samples taken after fasting for at least 8 hours.    Blood Alcohol level:  Lab Results  Component Value Date   ETH <10 11/26/2022   ETH <10 [id number]    Metabolic Disorder Labs: Lab Results  Component Value Date   HGBA1C 6.8 (H) 09/30/2022   MPG 148 09/30/2022   MPG 174.29 07/02/2022   No results found for: "PROLACTIN" Lab Results  Component Value Date   CHOL 149 04/25/2021   TRIG 166 (H) 04/25/2021   HDL 57 04/25/2021   CHOLHDL 2.6 04/25/2021   VLDL 27 12/09/2019   LDLCALC 68 04/25/2021   LDLCALC 120 (H) 12/09/2019    Physical Findings: AIMS:  , ,  ,  ,    CIWA:    COWS:     Musculoskeletal: Strength & Muscle Tone: within normal limits Gait & Station: normal Patient leans: N/A  Psychiatric Specialty Exam:  Presentation  General Appearance:  Appropriate for Environment  Eye Contact: Good  Speech: Clear and Coherent  Speech Volume: Normal  Handedness: Right   Mood and Affect  Mood: Euthymic  Affect: Appropriate   Thought Process  Thought Processes: Coherent  Descriptions of Associations:Intact  Orientation:Full (Time, Place and Person)  Thought Content:WDL  History of Schizophrenia/Schizoaffective disorder:No  Duration of Psychotic Symptoms:No data recorded Hallucinations:No data recorded Ideas of Reference:None  Suicidal Thoughts:No data recorded Homicidal Thoughts:No data recorded  Sensorium  Memory: Immediate Fair; Recent Fair  Judgment: Fair  Insight: Fair   Community education officer  Concentration: Fair  Attention Span: Fair  Recall: [employer] of Knowledge: Fair  Language: Good   Psychomotor Activity  Psychomotor Activity:No data recorded  Assets  Assets: Communication Skills   Sleep  Sleep:No data recorded   Physical Exam: Physical Exam Vitals and nursing note reviewed.   Constitutional:      Appearance: Normal appearance.  HENT:     Head: Normocephalic and atraumatic.     Mouth/Throat:     Pharynx: Oropharynx is clear.  Eyes:     Pupils: Pupils are equal, round, and reactive to light.  Cardiovascular:     Rate and Rhythm: Normal rate and regular rhythm.  Pulmonary:     Effort: Pulmonary effort is normal.     Breath sounds: Normal breath sounds.  Abdominal:     General: Abdomen is flat.     Palpations: Abdomen is soft.  Musculoskeletal:        General: Normal range of motion.  Skin:  General: Skin is warm and dry.  Neurological:     General: No focal deficit present.     Mental Status: He is alert. Mental status is at baseline.  Psychiatric:        Attention and Perception: Attention normal.        Mood and Affect: Mood normal.        Speech: Speech normal.        Behavior: Behavior normal.        Thought Content: Thought content normal.        Cognition and Memory: Cognition normal.        Judgment: Judgment normal.    Review of Systems  Constitutional: Negative.   HENT: Negative.    Eyes: Negative.   Respiratory: Negative.    Cardiovascular: Negative.   Gastrointestinal: Negative.   Musculoskeletal: Negative.   Skin: Negative.   Neurological: Negative.   Psychiatric/Behavioral: Negative.     Blood pressure (!) 157/84, pulse 70, temperature 99 F (37.2 C), temperature source Oral, resp. rate 16, height '5\' 8"'$  (1.727 m), weight 67.2 kg, SpO2 100 %. Body mass index is 22.53 kg/m.   Treatment Plan Summary: Medication management and Plan based on his complaint we will check a urinalysis today.  Blood pressure slightly high but stable.  No change to medicine today.  Alethia Berthold, MD 12/08/2022, 1:32 PM

## 2022-12-09 ENCOUNTER — Other Ambulatory Visit: Payer: Self-pay

## 2022-12-09 MED ORDER — NITROGLYCERIN 0.4 MG SL SUBL
0.4000 mg | SUBLINGUAL_TABLET | SUBLINGUAL | 0 refills | Status: DC | PRN
  Filled 2022-12-09: qty 25, 1d supply, fill #0

## 2022-12-09 MED ORDER — ATORVASTATIN CALCIUM 40 MG PO TABS
40.0000 mg | ORAL_TABLET | Freq: Every day | ORAL | 3 refills | Status: DC
  Filled 2022-12-09: qty 30, 30d supply, fill #0

## 2022-12-09 MED ORDER — QUETIAPINE FUMARATE 50 MG PO TABS
50.0000 mg | ORAL_TABLET | Freq: Every day | ORAL | 3 refills | Status: DC
  Filled 2022-12-09: qty 30, 30d supply, fill #0

## 2022-12-09 MED ORDER — DULOXETINE HCL 20 MG PO CPEP
20.0000 mg | ORAL_CAPSULE | Freq: Every day | ORAL | 3 refills | Status: DC
  Filled 2022-12-09: qty 30, 30d supply, fill #0

## 2022-12-09 MED ORDER — DILTIAZEM HCL ER COATED BEADS 120 MG PO CP24
120.0000 mg | ORAL_CAPSULE | Freq: Every day | ORAL | 0 refills | Status: DC
  Filled 2022-12-09: qty 30, 30d supply, fill #0

## 2022-12-09 MED ORDER — ISOSORBIDE MONONITRATE ER 30 MG PO TB24
15.0000 mg | ORAL_TABLET | Freq: Every day | ORAL | 3 refills | Status: DC
  Filled 2022-12-09: qty 30, 60d supply, fill #0

## 2022-12-09 MED ORDER — FLUTICASONE FUROATE-VILANTEROL 200-25 MCG/ACT IN AEPB
1.0000 | INHALATION_SPRAY | Freq: Every day | RESPIRATORY_TRACT | 3 refills | Status: DC
  Filled 2022-12-09: qty 60, 30d supply, fill #0

## 2022-12-09 NOTE — Progress Notes (Signed)
D- Patient alert and oriented x 4. Patient presents with a pleasant mood and affect. Patient denies SI, HI, AVH, and pain. Patient denies anxiety and depression. Patient states his goal is to be discharged today. No distress noted in patient.   A- Scheduled medications administered to patient, per MD orders. Support and encouragement provided.  Routine safety checks conducted every 15 minutes.  Patient informed to notify staff with problems or concerns.  R- No adverse drug reactions noted. Patient contracts for safety at this time. Patient compliant with medications and treatment plan. Patient receptive, calm, and cooperative. Patient interacts well with others on the unit.  Patient remains safe at this time.   12/09/22 0840  Charting Type  Charting Type Shift assessment  Safety Check Verification  Has the RN verified the 15 minute safety check completion? Yes  Neurological  Neuro (WDL) WDL  Orientation Level Oriented X4  Cognition Appropriate at baseline  Speech Clear  Neuro Symptoms None  HEENT  HEENT (WDL) X  Teeth Poor dental hygiene  Voice Clear  Lips Symmetrical  Respiratory  Respiratory (WDL) WDL  Respiratory Pattern Regular;Unlabored  Chest Assessment Chest expansion symmetrical  Cardiac  Cardiac (WDL) WDL  Vascular  Vascular (WDL) WDL  Integumentary  Integumentary (WDL) WDL  Braden Scale (Ages 8 and up)  Sensory Perceptions 4  Moisture 4  Activity 3  Mobility 3  Nutrition 3  Friction and Shear 3  Braden Scale Score 20  Musculoskeletal  Musculoskeletal (WDL) X  Assistive Device Front wheel walker  Weight Bearing Restrictions No  Gastrointestinal  Gastrointestinal (WDL) WDL  Last BM Date  12/08/22  GU Assessment  Genitourinary (WDL) WDL  Neurological  Level of Consciousness Alert

## 2022-12-09 NOTE — Plan of Care (Signed)
  Problem: Clinical Measurements: Goal: Ability to maintain clinical measurements within normal limits will improve Outcome: Progressing   Problem: Nutrition: Goal: Adequate nutrition will be maintained Outcome: Progressing   Problem: Coping: Goal: Level of anxiety will decrease Outcome: Progressing   Problem: Safety: Goal: Ability to remain free from injury will improve Outcome: Progressing

## 2022-12-09 NOTE — Progress Notes (Signed)
Patient is pleasant and cooperative. Denies SI, HI, AVH. Ambulates independently with walker. Urine sent to lab. Interacts appropriately with staff and peers. Encouragement and support provided. Safety checks maintained. Meds given as prescribed. Pt receptive and remains safe on unit with q 15 min checks.

## 2022-12-09 NOTE — BHH Suicide Risk Assessment (Signed)
[facility] Discharge Suicide Risk Assessment   Principal Problem: Major depressive disorder, recurrent, severe without psychotic features ([place]) Discharge Diagnoses: Principal Problem:   Major depressive disorder, recurrent, severe without psychotic features ([place]) Active Problems:   Cocaine use disorder ([name])   MDD (major depressive disorder), recurrent episode, severe ([place])   Total Time spent with patient: 1 hour  Musculoskeletal: Strength & Muscle Tone: within normal limits Gait & Station: normal Patient leans: N/A  Psychiatric Specialty Exam  Presentation  General Appearance:  Appropriate for Environment  Eye Contact: Good  Speech: Clear and Coherent  Speech Volume: Normal  Handedness: Right   Mood and Affect  Mood: Euthymic  Duration of Depression Symptoms: Greater than two weeks  Affect: Appropriate   Thought Process  Thought Processes: Coherent  Descriptions of Associations:Intact  Orientation:Full (Time, Place and Person)  Thought Content:WDL  History of Schizophrenia/Schizoaffective disorder:No  Duration of Psychotic Symptoms:No data recorded Hallucinations:No data recorded Ideas of Reference:None  Suicidal Thoughts:No data recorded Homicidal Thoughts:No data recorded  Sensorium  Memory: Immediate Fair; Recent Fair  Judgment: Fair  Insight: Fair   Community education officer  Concentration: Fair  Attention Span: Fair  Recall: [place] of Knowledge: Fair  Language: Good   Psychomotor Activity  Psychomotor Activity:No data recorded  Assets  Assets: Communication Skills   Sleep  Sleep:No data recorded  Blood pressure (!) 178/84, pulse 69, temperature 98.5 F (36.9 C), temperature source Oral, resp. rate 16, height '5\' 8"'$  (1.727 m), weight 67.2 kg, SpO2 100 %. Body mass index is 22.53 kg/m.  Mental Status Per Nursing Assessment::   On Admission:  Suicidal ideation indicated by patient  Demographic Factors:  Male,  Age 73 or older, and Low socioeconomic status  Loss Factors: Decline in physical health  Historical Factors: NA  Risk Reduction Factors:   NA  Continued Clinical Symptoms:  Depression:   Anhedonia  Cognitive Features That Contribute To Risk:  Loss of executive function    Suicide Risk:  Minimal: No identifiable suicidal ideation.  Patients presenting with no risk factors but with morbid ruminations; may be classified as minimal risk based on the severity of the depressive symptoms   [place] Follow up on 12/12/2022.   Why: You have a follow up appointment scheduled for Thursday March 7th at 3pm. Please arrive eary to complete paperwork. Thanks! Contact information: [place] [address] [facility] [place] Follow up.   Why: Your referral has been sent. Please contact their office on Tuesday 12/10/22 to schedule an initial appointment. Thanks! Contact information: [place] [address] [phone]                 Plan Of Care/Follow-up recommendations: Alinda Deem, DO 12/09/2022, 10:20 AM

## 2022-12-09 NOTE — Group Note (Signed)
[facility] LCSW Group Therapy Note    Group Date: 12/09/2022 Start Time: 1300 End Time: 1340  Type of Therapy and Topic:  Group Therapy:  Overcoming Obstacles  Participation Level:  BHH PARTICIPATION LEVEL: Did Not Attend    Description of Group:   In this group patients will be encouraged to explore what they see as obstacles to their own wellness and recovery. They will be guided to discuss their thoughts, feelings, and behaviors related to these obstacles. The group will process together ways to cope with barriers, with attention given to specific choices patients can make. Each patient will be challenged to identify changes they are motivated to make in order to overcome their obstacles. This group will be process-oriented, with patients participating in exploration of their own experiences as well as giving and receiving support and challenge from other group members.  Therapeutic Goals: 1. Patient will identify personal and current obstacles as they relate to admission. 2. Patient will identify barriers that currently interfere with their wellness or overcoming obstacles.  3. Patient will identify feelings, thought process and behaviors related to these barriers. 4. Patient will identify two changes they are willing to make to overcome these obstacles:    Summary of Patient Progress  X   Therapeutic Modalities:   Cognitive Behavioral Therapy Solution Focused Therapy Motivational Interviewing Relapse Prevention Therapy   Cleatus Goodin [name], LCSWA

## 2022-12-09 NOTE — Discharge Summary (Signed)
Physician Discharge Summary Note  Patient:  Michael [name] is an 72 y.o., male MRN:  [id number] DOB:  [birth year] Patient phone:  [phone] (home)  Patient address:   [place] [id number],  Total Time spent with patient: 1 hour  Date of Admission:  11/27/2022 Date of Discharge: 12/09/2022  Reason for Admission:  72 yo male presented to the ED after using crack/cocaine with chest pain and suicidal ideations.  Long history of cocaine use.  On assessment, he rates his depression and anxiety at a 7/10 with 10 being the highest.  Passive suicidal ideations, no plan or intent.  He reports one past suicide attempt by cutting himself last year and one admission, no stitches or sutures needed.  Sleep is fair, multiple health issues.  Appetite without issues.  His biggest stressor is lack of housing and requests an apartment, referred to social work and let him know that this will most likely need to be worked out with [facility] that he has visited.    Per social work, [employer] on admission: Michael [name]. Weisman is a 72 year-old widowed male who presents to [name] via EMS. Patient reports a history of depression. Patient states he contacted EMS due to chest pain and being suicidal. He says "I don't like the way my life is, I have a lot of health problems." Patient denies having a plan. Additionally, patient endorses HI with no plan or identified person. Patient states he has been feeling this way a couple of months. Patient denies current auditory or visual hallucinations. Patient states he smokes crack cocaine and last smoked two days ago. Patient UDS is positive for cocaine. Patient denies any additional substance use.    Patient identifies his lifestyle as his primary stressor. Patient reports he has been homeless for about two years and spends most of his time at hotels or in the [name]. Patient states he has numerous health issues. He reports kidney problems, 2 heart  attacks and mild strokes. He also uses a cane or walker for mobility. Patient denies having any supports, due to being estranged from family. Patient denies any history of abuse.    Patient is not currently receiving any mental health or substance use services. Patient reports he was discharged from the [name] to [facility] in January, however they discharged him due to his health issues. Patient reports one previous psychiatric hospitalization a few years ago, due to a suicide attempt by cutting.  Principal Problem: Major depressive disorder, recurrent, severe without psychotic features [facility]) Discharge Diagnoses: Principal Problem:   Major depressive disorder, recurrent, severe without psychotic features (Michael [name]) Active Problems:   Cocaine use disorder ([place])   MDD (major depressive disorder), recurrent episode, severe (Michael [name])   Past Psychiatric History: As above  Past Medical History:  Past Medical History:  Diagnosis Date   A-fib ([place])    Anemia    Asthma    No PFTs, history of childhood asthma   CAD (coronary artery disease)    Cellulitis 04/2014   left facial   Cellulitis and abscess of toe of right foot 12/08/2019   Chondromalacia of medial femoral condyle    Left knee MRI 04/28/12: Chondromalacia of the medial femoral condyle with slight peripheral degeneration of the meniscocapsular junction of the medial meniscus; followed by sports medicine   Chronic kidney failure, stage 4 (severe) (Michael [name])    Collagen vascular disease (Michael [name])    Crack cocaine use    for 20+  years, has been enrolled in detox programs in the past   Depression    with history of hospitalization for suicidal ideation   Diabetes mellitus 2002   Diagnosed in 2002, started insulin in 2012   Gout    Headache(784.0)    CT head 08/2011: Periventricular and subcortical white matter hypodensities are most in keeping with chronic microangiopathic change   HIV infection (Michael [name]) 08/2011   Followed by Dr.  [name]   Hyperlipidemia    Hypertension    Pulmonary embolism Michael [name])     Past Surgical History:  Procedure Laterality Date   AMPUTATION Right 07/21/2019   Procedure: RIGHT SECOND TOE AMPUTATION;  Surgeon: [name], MD;  Location: [facility];  Service: Orthopedics;  Laterality: Right;   BACK SURGERY     1988   BOWEL RESECTION     CARDIAC SURGERY     CERVICAL SPINE SURGERY     " rods in my neck "   CORONARY ARTERY BYPASS GRAFT     CORONARY STENT PLACEMENT     NM MYOCAR PERF WALL MOTION  12/27/2011   normal   SPINE SURGERY     Family History:  Family History  Problem Relation Age of Onset   Diabetes Mother    Hypertension Mother    Hyperlipidemia Mother    Diabetes Father    Cancer Father    Hypertension Father    Diabetes Brother    Heart disease Brother    Diabetes Sister    Colon cancer Neg Hx    Family Psychiatric  History: Unremarkable Social History:  Social History   Substance and Sexual Activity  Alcohol Use No   Alcohol/week: 4.0 standard drinks of alcohol   Types: 2 Cans of beer, 2 Shots of liquor per week     Social History   Substance and Sexual Activity  Drug Use Not Currently   Frequency: 4.0 times per week   Types: "Crack" cocaine, Cocaine   Comment: Recent use of crack    Social History   Socioeconomic History   Marital status: Widowed    Spouse name: Not on file   Number of children: 2   Years of education: 80   Highest education level: 12th grade  Occupational History    Employer: UNEMPLOYED    Comment: 04/2016  Tobacco Use   Smoking status: Never   Smokeless tobacco: Never  Vaping Use   Vaping Use: Never used  Substance and Sexual Activity   Alcohol use: No    Alcohol/week: 4.0 standard drinks of alcohol    Types: 2 Cans of beer, 2 Shots of liquor per week   Drug use: Not Currently    Frequency: 4.0 times per week    Types: "Crack" cocaine, Cocaine    Comment: Recent use of crack   Sexual activity: Yes    Comment: DECLINED  CONDOMS  Other Topics Concern   Not on file  Social History Narrative   Currently staying with a friend in [place].  Was staying @ local motel until a few days ago - left b/c of bed bugs.   Picked up from [facility].  Not followed by a psychiatrist.   Social Determinants of Health   Financial Resource Strain: Medium Risk (09/16/2018)   Overall Financial Resource Strain (CARDIA)    Difficulty of Paying Living Expenses: Somewhat hard  Food Insecurity: No Food Insecurity (10/22/2022)   Hunger Vital Sign    Worried About Running Out of Food in the  Last Year: Never true    [place] in the Last Year: Never true  Recent Concern: [place] Present (09/10/2022)   Hunger Vital Sign    Worried About Running Out of Food in the Last Year: Sometimes true    Ran Out of Food in the Last Year: Sometimes true  Transportation Needs: Unmet Transportation Needs (10/22/2022)   PRAPARE - Hydrologist (Medical): Yes    Lack of Transportation (Non-Medical): Yes  Physical Activity: Unknown (04/22/2019)   Exercise Vital Sign    Days of Exercise per Week: Not on file    Minutes of Exercise per Session: 0 min  Stress: Stress Concern Present (09/16/2018)   [place]    Feeling of Stress : Very much  Social Connections: Socially Isolated (09/16/2018)   Social Connection and Isolation Panel [NHANES]    Frequency of Communication with Friends and Family: Once a week    Frequency of Social Gatherings with Friends and Family: Never    Attends Religious Services: Never    Marine scientist or Organizations: No    Attends Archivist Meetings: Never    Marital Status: Widowed    [name] Course: Richardd is a 72 year old African-American male who was voluntarily admitted to inpatient psychiatry at [facility] on transfer from [facility].  He has multiple medical problems including CHF,  COPD, and HIV.  While on the inpatient unit his antidepressant was changed to Cymbalta and he did well with this.  Initially was on trazodone which was changed to Seroquel because of his cardiac issues.  He did well with the Seroquel and his mood improved.  His sleep and appetite improved.  He is homeless which makes it difficult for him to take his medications.  Social work has been working on finding him accommodations.  While on the inpatient unit he was pleasant and cooperative and participated in individual and group therapy.  His mood improved and he no longer was suicidal.  It was felt that he maximized hospitalization and he was discharged.  His medications were sent to the [name] which he is going to pick up on his way out. Biktarvy was not started due to the fact that his GFR was low.  Physical Findings: AIMS:  , ,  ,  ,    CIWA:    COWS:     Musculoskeletal: Strength & Muscle Tone: within normal limits Gait & Station: normal Patient leans: N/A   Psychiatric Specialty Exam:  Presentation  General Appearance:  Appropriate for Environment  Eye Contact: Good  Speech: Clear and Coherent  Speech Volume: Normal  Handedness: Right   Mood and Affect  Mood: Euthymic  Affect: Appropriate   Thought Process  Thought Processes: Coherent  Descriptions of Associations:Intact  Orientation:Full (Time, Place and Person)  Thought Content:WDL  History of Schizophrenia/Schizoaffective disorder:No  Duration of Psychotic Symptoms:No data recorded Hallucinations:No data recorded Ideas of Reference:None  Suicidal Thoughts:No data recorded Homicidal Thoughts:No data recorded  Sensorium  Memory: Immediate Fair; Recent Fair  Judgment: Fair  Insight: Fair   Community education officer  Concentration: Fair  Attention Span: Fair  Recall: [place] of Knowledge: Fair  Language: Good   Psychomotor Activity  Psychomotor Activity:No data  recorded  Assets  Assets: Communication Skills   Sleep  Sleep:No data recorded   Physical Exam: Physical Exam Vitals and nursing note reviewed.  Constitutional:  Appearance: Normal appearance. He is normal weight.  Neurological:     General: No focal deficit present.     Mental Status: He is alert and oriented to person, place, and time.  Psychiatric:        Attention and Perception: Attention and perception normal.        Mood and Affect: Mood and affect normal.        Speech: Speech normal.        Behavior: Behavior normal. Behavior is cooperative.        Thought Content: Thought content normal.        Cognition and Memory: Cognition and memory normal.        Judgment: Judgment normal.    Review of Systems  Constitutional: Negative.   HENT: Negative.    Eyes: Negative.   Respiratory: Negative.    Cardiovascular: Negative.   Gastrointestinal: Negative.   Genitourinary: Negative.   Musculoskeletal: Negative.   Skin: Negative.   Neurological: Negative.   Endo/Heme/Allergies: Negative.   Psychiatric/Behavioral: Negative.     Blood pressure (!) 178/84, pulse 69, temperature 98.5 F (36.9 C), temperature source Oral, resp. rate 16, height '5\' 8"'$  (1.727 m), weight 67.2 kg, SpO2 100 %. Body mass index is 22.53 kg/m.   Social History   Tobacco Use  Smoking Status Never  Smokeless Tobacco Never   Tobacco Cessation:  A prescription for an FDA-approved tobacco cessation medication was offered at discharge and the patient refused   Blood Alcohol level:  Lab Results  Component Value Date   [facility] <10 11/26/2022   ETH <10 [id number]    Metabolic Disorder Labs:  Lab Results  Component Value Date   HGBA1C 6.8 (H) 09/30/2022   MPG 148 09/30/2022   MPG 174.29 07/02/2022   No results found for: "PROLACTIN" Lab Results  Component Value Date   CHOL 149 04/25/2021   TRIG 166 (H) 04/25/2021   HDL 57 04/25/2021   CHOLHDL 2.6 04/25/2021   VLDL 27 12/09/2019    LDLCALC 68 04/25/2021   LDLCALC 120 (H) 12/09/2019    See Psychiatric Specialty Exam and Suicide Risk Assessment completed by Attending Physician prior to discharge.  Discharge destination:  Other:  See social work notes  Is patient on multiple antipsychotic therapies at discharge:  No   Has Patient had three or more failed trials of antipsychotic monotherapy by history:  No  Recommended Plan for Multiple Antipsychotic Therapies: NA   Allergies as of 12/09/2022   No Known Allergies      Medication List     STOP taking these medications    ferrous sulfate 325 (65 FE) MG tablet   FLUoxetine 20 MG capsule Commonly known as: PROZAC   gabapentin 300 MG capsule Commonly known as: NEURONTIN   hydrALAZINE 50 MG tablet Commonly known as: APRESOLINE   meclizine 25 MG tablet Commonly known as: ANTIVERT   polyethylene glycol powder 17 GM/SCOOP powder Commonly known as: GLYCOLAX/MIRALAX   Symbicort 160-4.5 MCG/ACT inhaler Generic drug: budesonide-formoterol Replaced by: fluticasone furoate-vilanterol 200-25 MCG/ACT Aepb       TAKE these medications      Indication  Accu-Chek Guide test strip Generic drug: glucose blood use as directed up to four times daily    Accu-Chek Guide w/Device Kit Use as directed.    Accu-Chek Softclix Lancets lancets Use as directed up to four times daily    albuterol 108 (90 Base) MCG/ACT inhaler Commonly known as: VENTOLIN HFA Inhale 1-2 puffs into the lungs  every 6 (six) hours as needed for wheezing or shortness of breath.  Indication: Chronic Obstructive Lung Disease   atorvastatin 40 MG tablet Commonly known as: LIPITOR Take 1 tablet (40 mg total) by mouth daily.  Indication: High Amount of Fats in the Blood   BD Pen Needle Nano U/F 32G X 4 MM Misc Generic drug: Insulin Pen Needle Use as directed up to 4 times daily. What changed: Another medication with the same name was removed. Continue taking this medication, and follow  the directions you see here.    diltiazem 120 MG 24 hr capsule Commonly known as: CARDIZEM CD Take 1 capsule (120 mg total) by mouth daily.  Indication: High Blood Pressure Disorder   DULoxetine 20 MG capsule Commonly known as: CYMBALTA Take 1 capsule (20 mg total) by mouth daily. Start taking on: December 10, 2022  Indication: Major Depressive Disorder   fluticasone furoate-vilanterol 200-25 MCG/ACT Aepb Commonly known as: BREO ELLIPTA Inhale 1 puff into the lungs daily. Start taking on: December 10, 2022 Replaces: Symbicort 160-4.5 MCG/ACT inhaler  Indication: Asthma   isosorbide mononitrate 30 MG 24 hr tablet Commonly known as: IMDUR Take 0.5 tablets (15 mg total) by mouth daily. Start taking on: December 10, 2022  Indication: Stable Angina Pectoris   nitroGLYCERIN 0.4 MG SL tablet Commonly known as: NITROSTAT Place 1 tablet (0.4 mg total) under the tongue every 5 (five) minutes as needed for chest pain.  Indication: Acute Angina Pectoris   QUEtiapine 50 MG tablet Commonly known as: SEROQUEL Take 1 tablet (50 mg total) by mouth at bedtime.  Indication: Generalized Anxiety Disorder, Major Depressive Disorder        Follow-up [facility] up on 12/12/2022.   Why: You have a follow up appointment scheduled for Thursday March 7th at 3pm. Please arrive eary to complete paperwork. Thanks! Contact information: [place] [address] [facility] Follow up.   Why: Your referral has been sent. Please contact their office on Tuesday 12/10/22 to schedule an initial appointment. Thanks! Contact information: [place] [address] [phone]                 Follow-up recommendations: [facility]     Signed: Parks Ranger, DO 12/09/2022, 10:46 AM

## 2022-12-09 NOTE — Care Management Important Message (Signed)
Important Message  Patient Details  Name: Michael [name] MRN: [id number] Date of Birth: March 11, 1951   Medicare Important Message Given:  Yes     Cylis Ayars [name], [place] 12/09/2022, 10:16 AM

## 2022-12-09 NOTE — Plan of Care (Signed)
  Problem: Education: Goal: Knowledge of General Education information will improve Description: Including pain rating scale, medication(s)/side effects and non-pharmacologic comfort measures Outcome: Progressing   Problem: Health Behavior/Discharge Planning: Goal: Ability to manage health-related needs will improve Outcome: Progressing   Problem: Clinical Measurements: Goal: Ability to maintain clinical measurements within normal limits will improve Outcome: Progressing Goal: Will remain free from infection Outcome: Progressing Goal: Diagnostic test results will improve Outcome: Progressing Goal: Respiratory complications will improve Outcome: Progressing Goal: Cardiovascular complication will be avoided Outcome: Progressing   Problem: Activity: Goal: Risk for activity intolerance will decrease Outcome: Progressing   Problem: Nutrition: Goal: Adequate nutrition will be maintained Outcome: Progressing   Problem: Coping: Goal: Level of anxiety will decrease Outcome: Progressing   Problem: Elimination: Goal: Will not experience complications related to bowel motility Outcome: Progressing Goal: Will not experience complications related to urinary retention Outcome: Progressing   Problem: Safety: Goal: Ability to remain free from injury will improve Outcome: Progressing   Problem: Skin Integrity: Goal: Risk for impaired skin integrity will decrease Outcome: Progressing   Problem: Education: Goal: Verbalization of understanding the information provided will improve Outcome: Progressing   Problem: Activity: Goal: Interest or engagement in activities will improve Outcome: Progressing Goal: Sleeping patterns will improve Outcome: Progressing

## 2022-12-09 NOTE — Group Note (Signed)
Date:  12/09/2022 Time:  12:35 PM  Group Topic/Focus:  Goals Group:   The focus of this group is to help patients establish daily goals to achieve during treatment and discuss how the patient can incorporate goal setting into their daily lives to aide in recovery. Self Care:   The focus of this group is to help patients understand the importance of self-care in order to improve or restore emotional, physical, spiritual, interpersonal, and financial health.    Participation Level:  Active  Participation Quality:  Appropriate and Attentive  Affect:  Appropriate  Cognitive:  Alert, Appropriate, and Oriented  Insight: Appropriate and Good  Engagement in Group:  Engaged  Modes of Intervention:  Activity, Discussion, and Limit-setting  Additional Comments:    Ladona Mow 12/09/2022, 12:35 PM

## 2022-12-09 NOTE — Progress Notes (Signed)
Discharge Note:  Patient denies SI/HI/AVH at this time. Discharge instructions, AVS, prescriptions, and transition record gone over with patient. Patient agrees to comply with medication management, follow-up visit, and outpatient therapy. Patient belongings returned and verified by patient. Patient questions and concerns addressed and answered. Patient ambulatory off unit. Patient discharged to with safe transport.

## 2022-12-09 NOTE — Progress Notes (Signed)
  [facility] Adult Case Management Discharge Plan :  Will you be returning to the same living situation after discharge:  No. Pt will be obtaining temporary housing at a hotel At discharge, do you have transportation home?: Yes,  CSW will assist pt in arranging transportation Do you have the ability to pay for your medications: Yes,  pt has [place] Medicare/Dual Complete  Release of information consent forms completed and in the chart;  Patient's signature needed at discharge.  Patient to Follow up at:  Follow-up [facility] Follow up on 12/12/2022.   Why: You have a follow up appointment scheduled for Thursday March 7th at 3pm. Please arrive eary to complete paperwork. Thanks! Contact information: [place] [address] [facility] Follow up.   Why: Your referral has been sent. Please contact their office on Tuesday 12/10/22 to schedule an initial appointment. Thanks! Contact information: [place] [address] [phone]                 Next level of care provider has access to [place] and Suicide Prevention discussed: Yes,  SPE completed with pt's son     Has patient been referred to the Quitline?: N/A patient is not a smoker  Patient has been referred for addiction treatment: Yes .Pt was referred to treatment but facilities declined due to no openings. Pt provided with substance use treatment facility resources.  Charmion Hapke [name], [place] 12/09/2022, 10:16 AM

## 2022-12-10 IMAGING — DX DG CHEST 2V
2 series · 2 of 2 positions shown · non-contrast
Comparison: Chest x-ray 10/14/2021.

CLINICAL DATA: Chest pain.

EXAM:
CHEST - 2 VIEW

[chest lat]
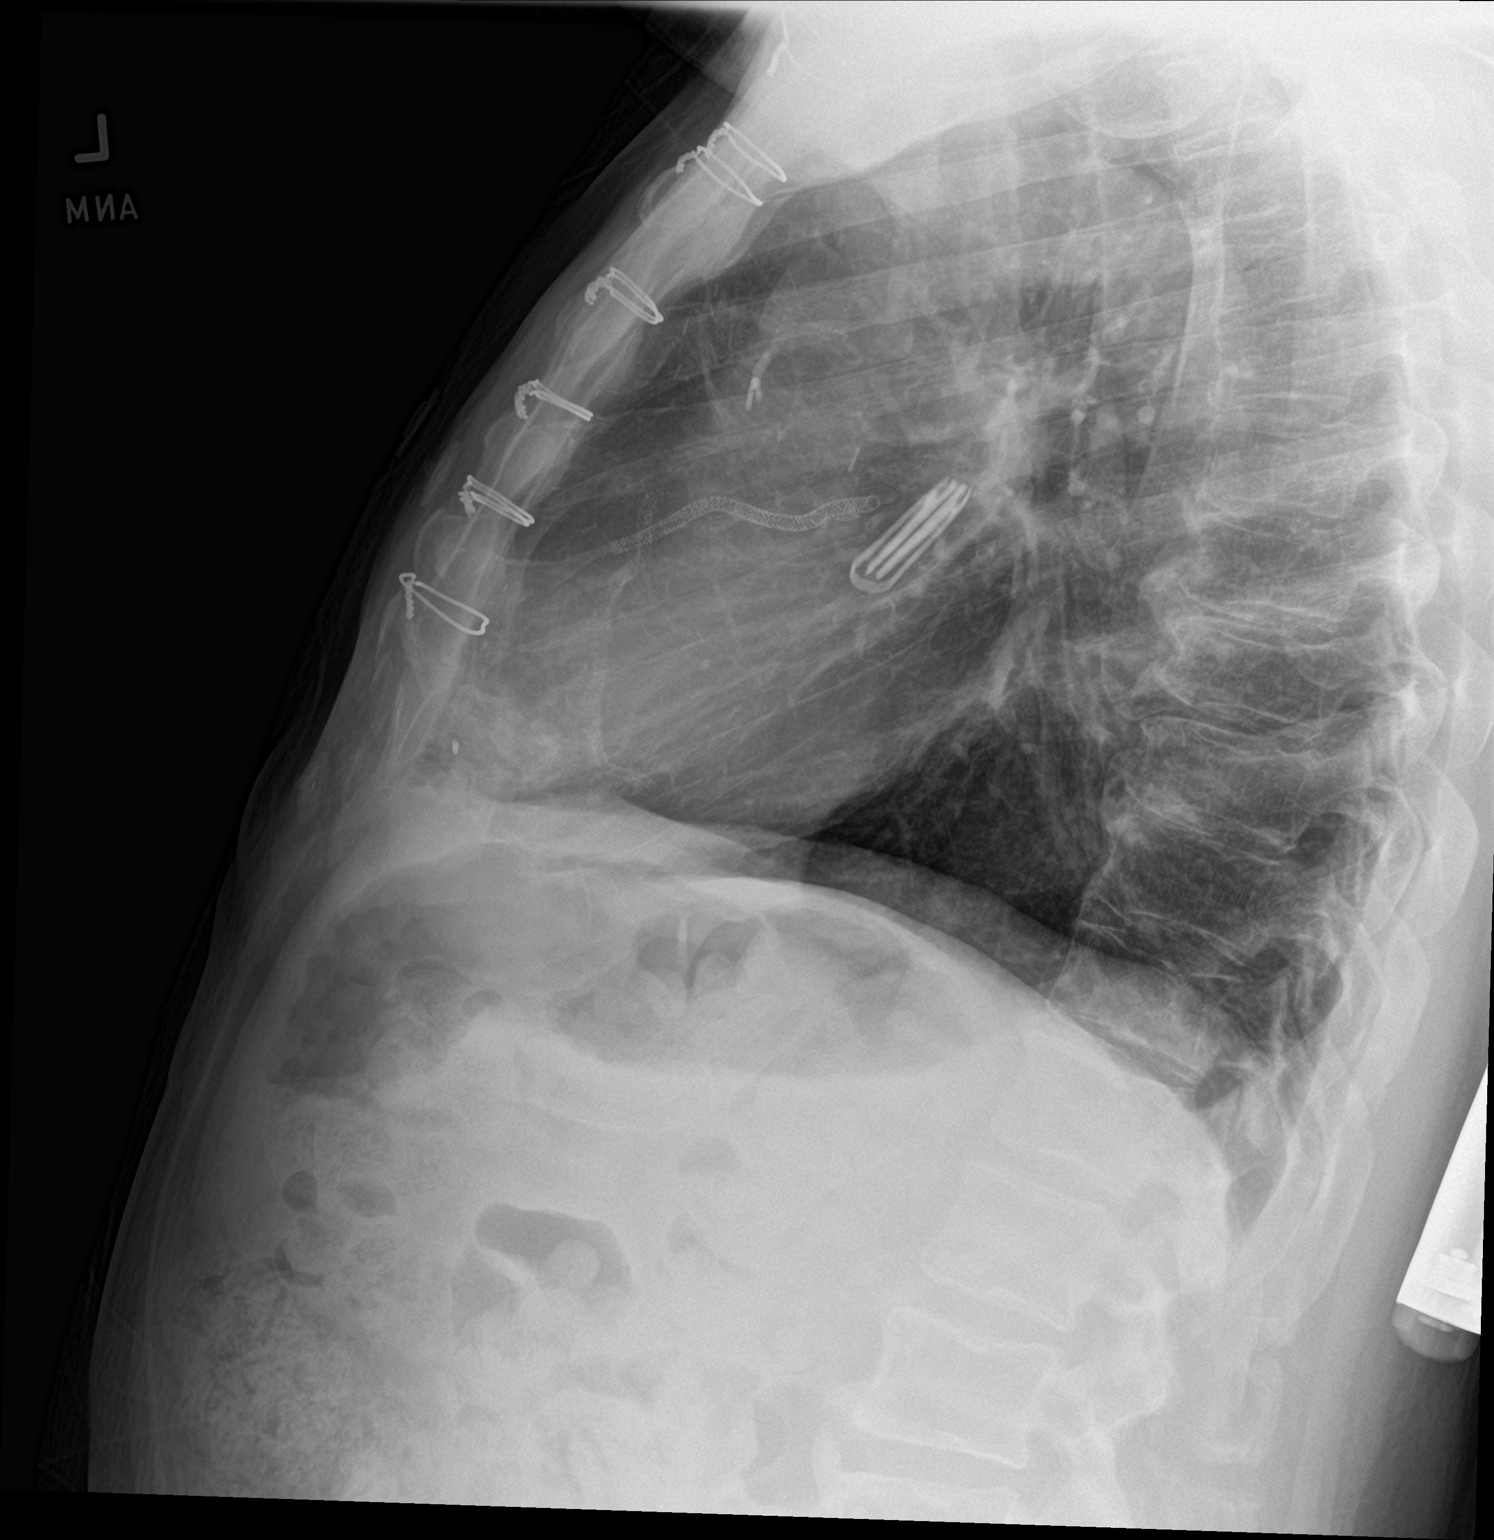

[chest ap]
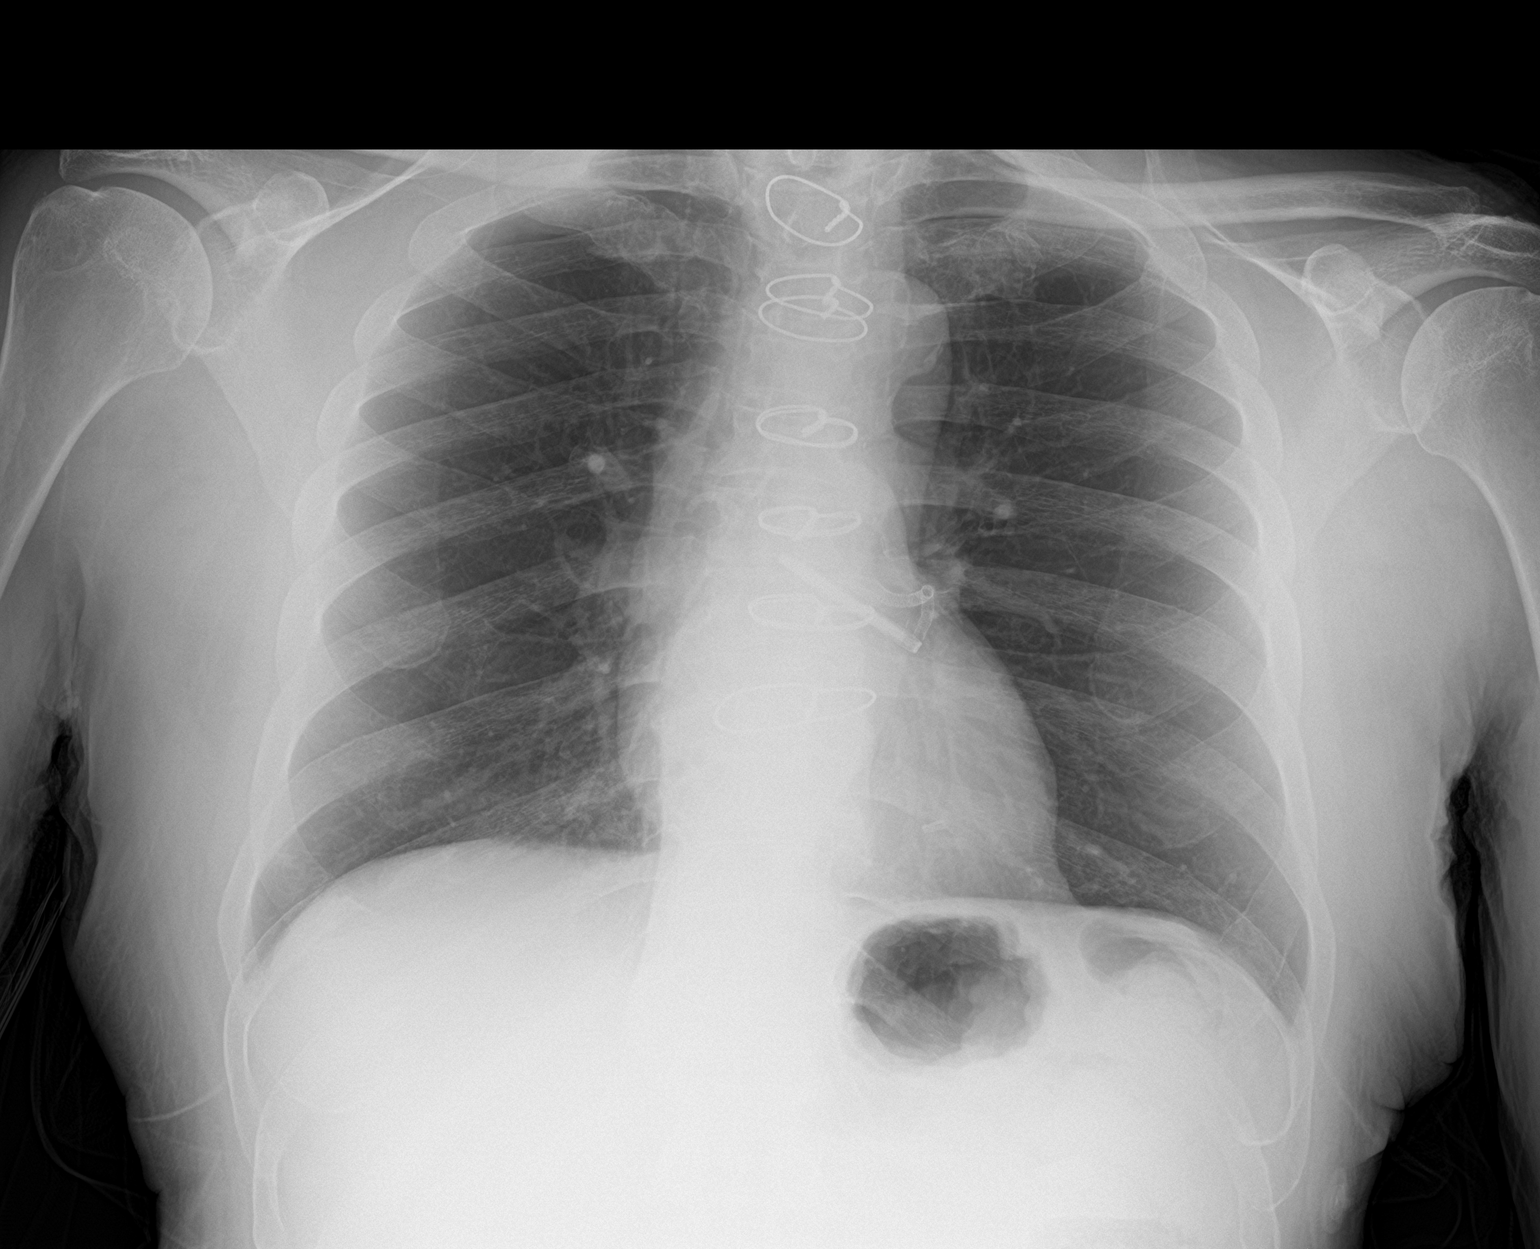

[2 of 2 positions shown; findings below may reference images not displayed]

FINDINGS: Cardiomediastinal silhouette is within normal limits. Patient is
status post cardiac surgery. The lungs are clear. There is no
pleural effusion or pneumothorax. Degenerative changes affect the
spine. No acute fractures are seen.
IMPRESSION: No active cardiopulmonary disease.

## 2022-12-11 ENCOUNTER — Inpatient Hospital Stay (HOSPITAL_COMMUNITY)
Admission: EM | Admit: 2022-12-11 | Discharge: 2022-12-23 | DRG: 683 | Disposition: A | Discharge status: Home / Self Care | Payer: 59 | Attending: Internal Medicine | Admitting: Internal Medicine

## 2022-12-11 DIAGNOSIS — R072 Precordial pain: Secondary | ICD-10-CM | POA: Diagnosis present

## 2022-12-11 DIAGNOSIS — Z833 Family history of diabetes mellitus: Secondary | ICD-10-CM

## 2022-12-11 DIAGNOSIS — N179 Acute kidney failure, unspecified: Principal | ICD-10-CM | POA: Diagnosis present

## 2022-12-11 DIAGNOSIS — F1414 Cocaine abuse with cocaine-induced mood disorder: Secondary | ICD-10-CM | POA: Diagnosis present

## 2022-12-11 DIAGNOSIS — M109 Gout, unspecified: Secondary | ICD-10-CM | POA: Diagnosis present

## 2022-12-11 DIAGNOSIS — I160 Hypertensive urgency: Secondary | ICD-10-CM | POA: Diagnosis present

## 2022-12-11 DIAGNOSIS — K219 Gastro-esophageal reflux disease without esophagitis: Secondary | ICD-10-CM | POA: Diagnosis present

## 2022-12-11 DIAGNOSIS — I130 Hypertensive heart and chronic kidney disease with heart failure and stage 1 through stage 4 chronic kidney disease, or unspecified chronic kidney disease: Secondary | ICD-10-CM | POA: Diagnosis present

## 2022-12-11 DIAGNOSIS — I998 Other disorder of circulatory system: Secondary | ICD-10-CM | POA: Diagnosis present

## 2022-12-11 DIAGNOSIS — Z794 Long term (current) use of insulin: Secondary | ICD-10-CM

## 2022-12-11 DIAGNOSIS — Z955 Presence of coronary angioplasty implant and graft: Secondary | ICD-10-CM

## 2022-12-11 DIAGNOSIS — I480 Paroxysmal atrial fibrillation: Secondary | ICD-10-CM | POA: Diagnosis present

## 2022-12-11 DIAGNOSIS — F331 Major depressive disorder, recurrent, moderate: Secondary | ICD-10-CM | POA: Diagnosis present

## 2022-12-11 DIAGNOSIS — D638 Anemia in other chronic diseases classified elsewhere: Secondary | ICD-10-CM | POA: Diagnosis present

## 2022-12-11 DIAGNOSIS — E1122 Type 2 diabetes mellitus with diabetic chronic kidney disease: Secondary | ICD-10-CM | POA: Diagnosis present

## 2022-12-11 DIAGNOSIS — Z89421 Acquired absence of other right toe(s): Secondary | ICD-10-CM

## 2022-12-11 DIAGNOSIS — I10 Essential (primary) hypertension: Secondary | ICD-10-CM | POA: Diagnosis present

## 2022-12-11 DIAGNOSIS — R079 Chest pain, unspecified: Secondary | ICD-10-CM

## 2022-12-11 DIAGNOSIS — Z951 Presence of aortocoronary bypass graft: Secondary | ICD-10-CM

## 2022-12-11 DIAGNOSIS — Z9049 Acquired absence of other specified parts of digestive tract: Secondary | ICD-10-CM

## 2022-12-11 DIAGNOSIS — F1410 Cocaine abuse, uncomplicated: Secondary | ICD-10-CM | POA: Diagnosis present

## 2022-12-11 DIAGNOSIS — B20 Human immunodeficiency virus [HIV] disease: Secondary | ICD-10-CM | POA: Diagnosis present

## 2022-12-11 DIAGNOSIS — E785 Hyperlipidemia, unspecified: Secondary | ICD-10-CM | POA: Diagnosis present

## 2022-12-11 DIAGNOSIS — I361 Nonrheumatic tricuspid (valve) insufficiency: Secondary | ICD-10-CM | POA: Diagnosis present

## 2022-12-11 DIAGNOSIS — Z8249 Family history of ischemic heart disease and other diseases of the circulatory system: Secondary | ICD-10-CM

## 2022-12-11 DIAGNOSIS — Z79899 Other long term (current) drug therapy: Secondary | ICD-10-CM

## 2022-12-11 DIAGNOSIS — J4489 Other specified chronic obstructive pulmonary disease: Secondary | ICD-10-CM | POA: Diagnosis present

## 2022-12-11 DIAGNOSIS — E872 Acidosis, unspecified: Secondary | ICD-10-CM | POA: Diagnosis present

## 2022-12-11 DIAGNOSIS — Z7901 Long term (current) use of anticoagulants: Secondary | ICD-10-CM

## 2022-12-11 DIAGNOSIS — R45851 Suicidal ideations: Secondary | ICD-10-CM

## 2022-12-11 DIAGNOSIS — N1832 Chronic kidney disease, stage 3b: Secondary | ICD-10-CM | POA: Diagnosis present

## 2022-12-11 DIAGNOSIS — Z91148 Patient's other noncompliance with medication regimen for other reason: Secondary | ICD-10-CM

## 2022-12-11 DIAGNOSIS — Z7151 Drug abuse counseling and surveillance of drug abuser: Secondary | ICD-10-CM

## 2022-12-11 DIAGNOSIS — Z21 Asymptomatic human immunodeficiency virus [HIV] infection status: Secondary | ICD-10-CM | POA: Diagnosis present

## 2022-12-11 DIAGNOSIS — D631 Anemia in chronic kidney disease: Secondary | ICD-10-CM | POA: Diagnosis present

## 2022-12-11 DIAGNOSIS — Z9151 Personal history of suicidal behavior: Secondary | ICD-10-CM

## 2022-12-11 DIAGNOSIS — I5022 Chronic systolic (congestive) heart failure: Secondary | ICD-10-CM | POA: Diagnosis present

## 2022-12-11 DIAGNOSIS — Z7951 Long term (current) use of inhaled steroids: Secondary | ICD-10-CM

## 2022-12-11 DIAGNOSIS — J449 Chronic obstructive pulmonary disease, unspecified: Secondary | ICD-10-CM | POA: Diagnosis present

## 2022-12-11 DIAGNOSIS — E1129 Type 2 diabetes mellitus with other diabetic kidney complication: Secondary | ICD-10-CM | POA: Diagnosis present

## 2022-12-11 DIAGNOSIS — G8929 Other chronic pain: Secondary | ICD-10-CM | POA: Diagnosis present

## 2022-12-11 DIAGNOSIS — K5900 Constipation, unspecified: Secondary | ICD-10-CM | POA: Diagnosis present

## 2022-12-11 DIAGNOSIS — Z86711 Personal history of pulmonary embolism: Secondary | ICD-10-CM

## 2022-12-11 DIAGNOSIS — I2510 Atherosclerotic heart disease of native coronary artery without angina pectoris: Secondary | ICD-10-CM | POA: Diagnosis present

## 2022-12-11 DIAGNOSIS — E875 Hyperkalemia: Secondary | ICD-10-CM | POA: Diagnosis present

## 2022-12-11 LAB — GLUCOSE, CAPILLARY
Glucose-Capillary: 93 mg/dL (ref 70–99)
Glucose-Capillary: 96 mg/dL (ref 70–99)

## 2022-12-11 IMAGING — MR MR HEAD W/O CM
6 of 11 series · 24 of 48 positions shown · non-contrast
Comparison: CT head 04/11/2019, MR head 12/15/2014

CLINICAL DATA: Right-sided weakness, history of cervical fusion

EXAM:
MRI HEAD WITHOUT CONTRAST
TECHNIQUE: Multiplanar, multiecho pulse sequences of the brain and surrounding
structures were obtained without intravenous contrast.

[Series 2: DWI · axial · 3.0mm · 0.94mm/px · z∈[-41,+97]mm · 6 of 94 slices shown (1 of 2)]
[im 1/94]
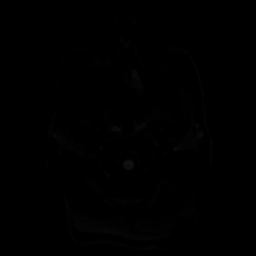
[im 19/94]
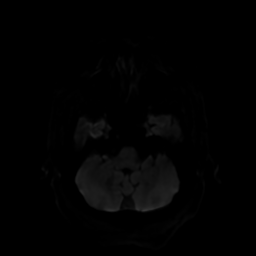
[im 38/94]
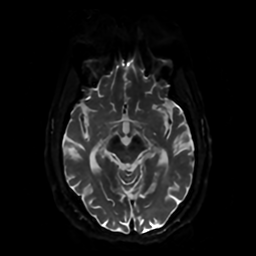
[im 56/94]
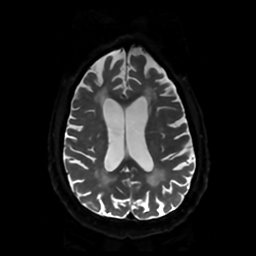
[im 75/94]
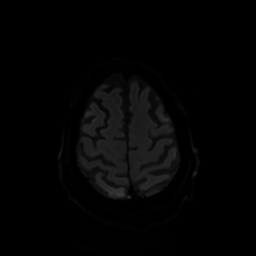
[im 94/94]
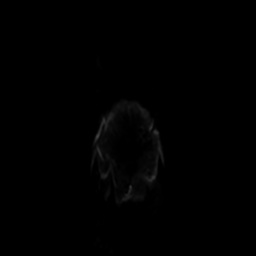

[Series 3: DWI · coronal · 4.0mm · 0.94mm/px · 6 of 72 slices shown (2 of 2)]
[im 1/72]
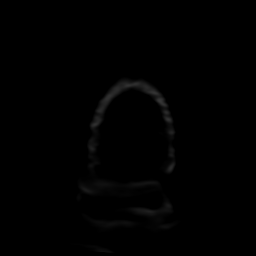
[im 15/72]
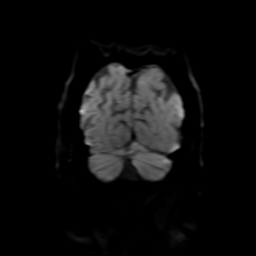
[im 29/72]
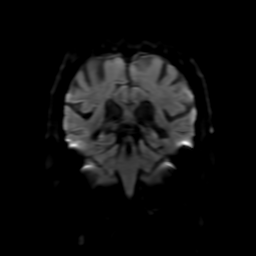
[im 43/72]
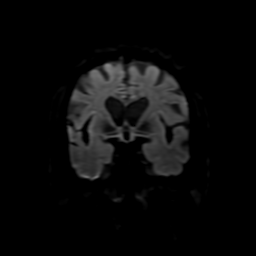
[im 57/72]
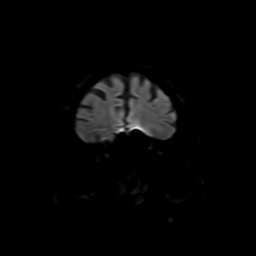
[im 72/72]
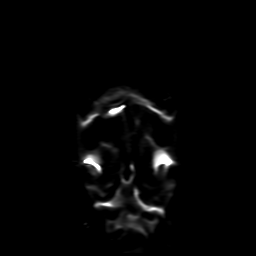

[Series 4: FLAIR · sagittal · 5.0mm · 0.23mm/px · 2 of 23 slices shown (1 of 2)]
[im 1/23]
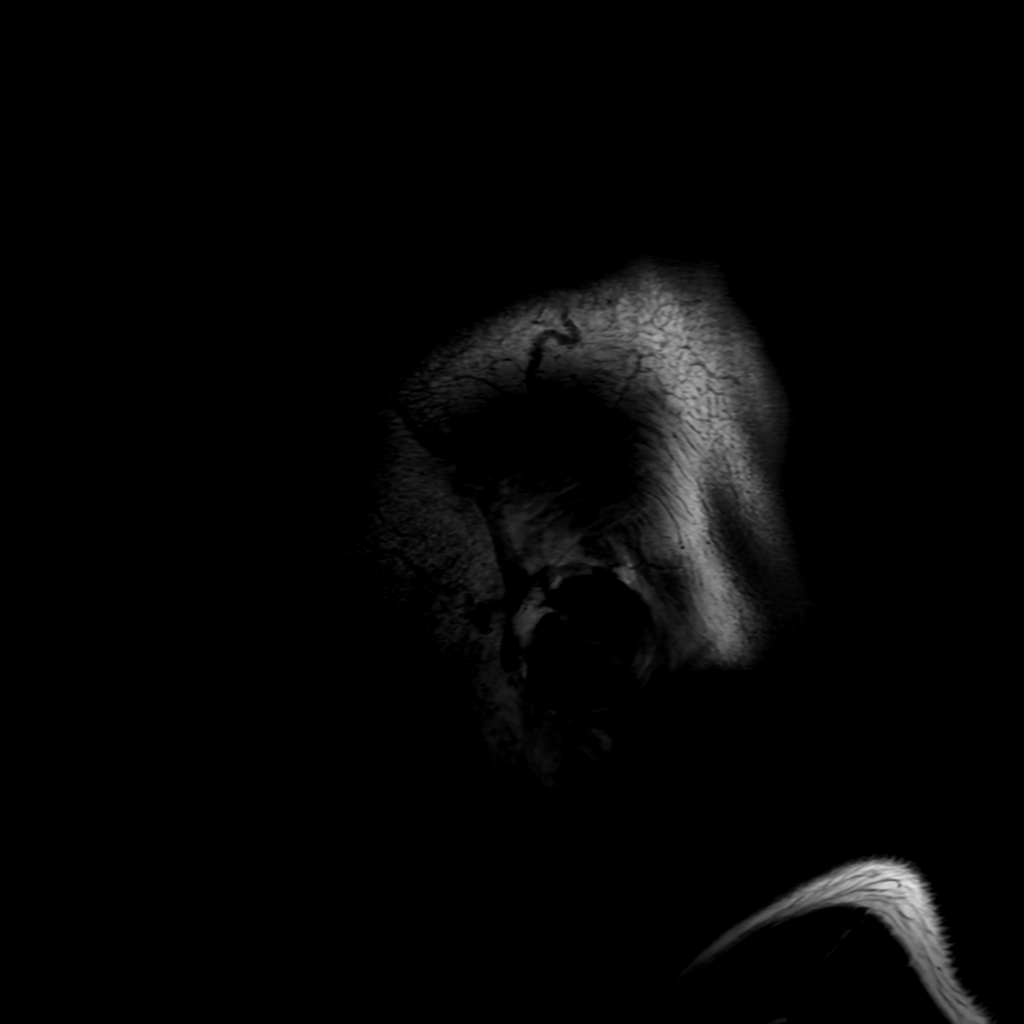
[im 23/23]
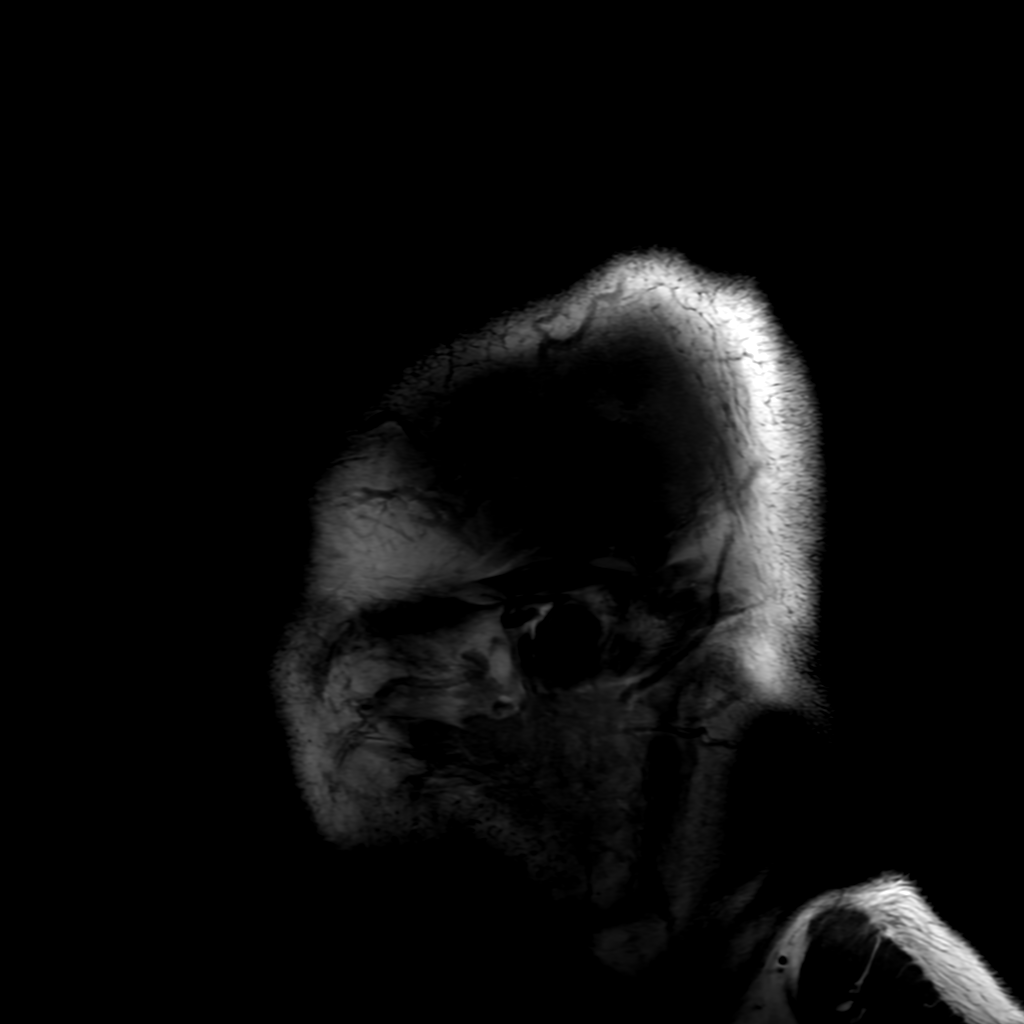

[Series 6: FLAIR · axial · 4.0mm · 0.45mm/px · z∈[-42,+99]mm · 3 of 33 slices shown (2 of 2)]
[im 1/33]
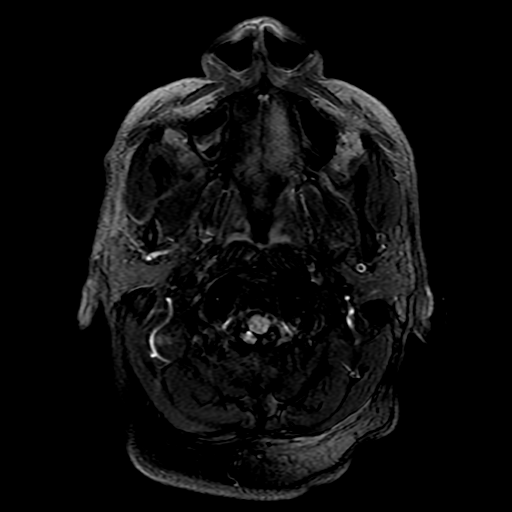
[im 17/33]
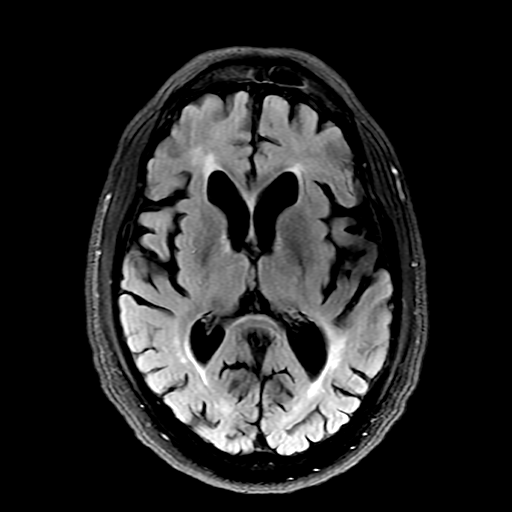
[im 33/33]
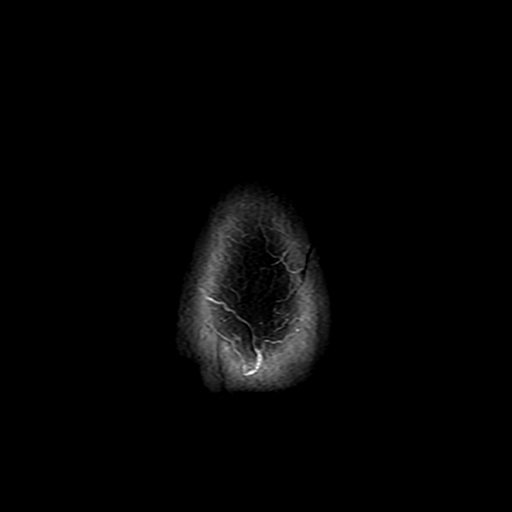

[Series 250: ADC · axial · 3.0mm · 0.94mm/px · z∈[-41,+97]mm · 4 of 47 slices shown (1 of 2)]
[im 1/47]
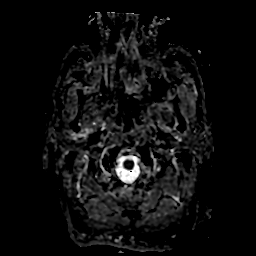
[im 16/47]
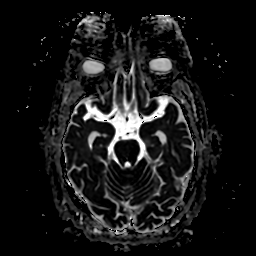
[im 31/47]
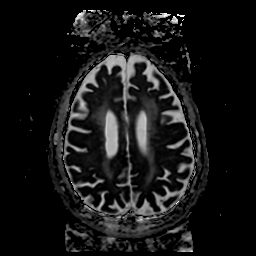
[im 47/47]
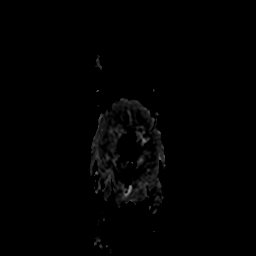

[Series 350: ADC · coronal · 4.0mm · 0.94mm/px · 3 of 37 slices shown (2 of 2)]
[im 1/37]
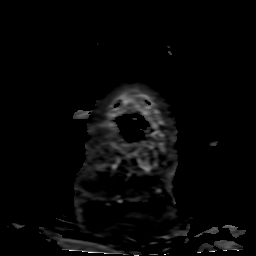
[im 19/37]
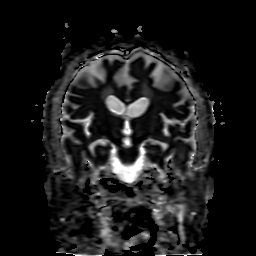
[im 37/37]
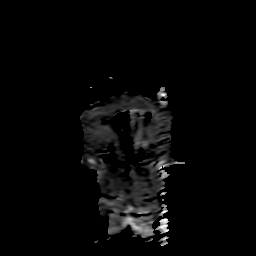

[24 of 48 positions shown; findings below may reference images not displayed]

FINDINGS: Brain: There is no evidence of acute intracranial hemorrhage,
extra-axial fluid collection, or acute infarct.

There is moderate global parenchymal volume loss with prominence of
the ventricular system and extra-axial CSF spaces. Confluent FLAIR
signal abnormality in the subcortical and periventricular white
matter likely reflects sequela of moderate chronic white matter
microangiopathy. Is no suspicious parenchymal signal abnormality.
There is a single punctate chronic microhemorrhage in the right
parietal lobe, nonspecific.

There is a small T1 hyperintense lesion measuring approximately 4 mm
in the third ventricle, unchanged since 0219 and consistent with a
small lipoma. There is no other mass lesion. There is no midline
shift.

Vascular: Normal flow voids.

Skull and upper cervical spine: There is no suspicious marrow
signal. Postsurgical changes are noted in the upper cervical spine.
There is mild adjacent segment disease at C2-C3 without evidence of
high-grade spinal canal stenosis or cord compression.

Sinuses/Orbits: There is minimal mucosal thickening in the paranasal
sinuses. The globes and orbits are unremarkable.

Other: None.
IMPRESSION: 1. No acute intracranial pathology.
2. Moderate global parenchymal volume loss and chronic white matter
microangiopathy.
3. Postsurgical changes in the upper cervical spine with mild
adjacent segment disease at C2-C3 without evidence of high-grade
spinal canal stenosis or cord compression to the level imaged.

## 2022-12-12 ENCOUNTER — Observation Stay (HOSPITAL_BASED_OUTPATIENT_CLINIC_OR_DEPARTMENT_OTHER): Payer: 59

## 2022-12-12 ENCOUNTER — Encounter (HOSPITAL_COMMUNITY): Payer: Self-pay

## 2022-12-12 ENCOUNTER — Emergency Department (HOSPITAL_COMMUNITY): Payer: 59

## 2022-12-12 ENCOUNTER — Observation Stay (HOSPITAL_COMMUNITY): Payer: 59

## 2022-12-12 ENCOUNTER — Other Ambulatory Visit: Payer: Self-pay

## 2022-12-12 DIAGNOSIS — F331 Major depressive disorder, recurrent, moderate: Secondary | ICD-10-CM

## 2022-12-12 DIAGNOSIS — R079 Chest pain, unspecified: Secondary | ICD-10-CM | POA: Diagnosis not present

## 2022-12-12 DIAGNOSIS — N179 Acute kidney failure, unspecified: Secondary | ICD-10-CM | POA: Diagnosis present

## 2022-12-12 LAB — ECHOCARDIOGRAM COMPLETE
AR max vel: 2.3 cm2
AV Area VTI: 2.39 cm2
AV Area mean vel: 2.48 cm2
AV Mean grad: 4 mmHg
AV Peak grad: 8.5 mmHg
Ao pk vel: 1.46 m/s
Area-P 1/2: 4.39 cm2
Calc EF: 58.1 %
Height: 68 in
MV M vel: 3.42 m/s
MV Peak grad: 46.8 mmHg
S' Lateral: 3.9 cm
Single Plane A2C EF: 62.9 %
Single Plane A4C EF: 55.8 %
Weight: 2496 oz

## 2022-12-12 LAB — CBC WITH DIFFERENTIAL/PLATELET
Abs Immature Granulocytes: 0.02 10*3/uL (ref 0.00–0.07)
Abs Immature Granulocytes: 0.02 10*3/uL (ref 0.00–0.07)
Basophils Absolute: 0 10*3/uL (ref 0.0–0.1)
Basophils Absolute: 0 10*3/uL (ref 0.0–0.1)
Basophils Relative: 0 %
Basophils Relative: 1 %
Eosinophils Absolute: 0 10*3/uL (ref 0.0–0.5)
Eosinophils Absolute: 0.1 10*3/uL (ref 0.0–0.5)
Eosinophils Relative: 0 %
Eosinophils Relative: 1 %
HCT: 29.8 % — ABNORMAL LOW (ref 39.0–52.0)
HCT: 33.8 % — ABNORMAL LOW (ref 39.0–52.0)
Hemoglobin: 10.5 g/dL — ABNORMAL LOW (ref 13.0–17.0)
Hemoglobin: 9 g/dL — ABNORMAL LOW (ref 13.0–17.0)
Immature Granulocytes: 0 %
Immature Granulocytes: 0 %
Lymphocytes Relative: 14 %
Lymphocytes Relative: 22 %
Lymphs Abs: 0.8 10*3/uL (ref 0.7–4.0)
Lymphs Abs: 1 10*3/uL (ref 0.7–4.0)
MCH: 28 pg (ref 26.0–34.0)
MCH: 28.5 pg (ref 26.0–34.0)
MCHC: 30.2 g/dL (ref 30.0–36.0)
MCHC: 31.1 g/dL (ref 30.0–36.0)
MCV: 91.6 fL (ref 80.0–100.0)
MCV: 92.8 fL (ref 80.0–100.0)
Monocytes Absolute: 0.3 10*3/uL (ref 0.1–1.0)
Monocytes Absolute: 0.3 10*3/uL (ref 0.1–1.0)
Monocytes Relative: 6 %
Monocytes Relative: 7 %
Neutro Abs: 3.2 10*3/uL (ref 1.7–7.7)
Neutro Abs: 4.3 10*3/uL (ref 1.7–7.7)
Neutrophils Relative %: 69 %
Neutrophils Relative %: 80 %
Platelets: 142 10*3/uL — ABNORMAL LOW (ref 150–400)
Platelets: 163 10*3/uL (ref 150–400)
RBC: 3.21 MIL/uL — ABNORMAL LOW (ref 4.22–5.81)
RBC: 3.69 MIL/uL — ABNORMAL LOW (ref 4.22–5.81)
RDW: 13.4 % (ref 11.5–15.5)
RDW: 13.7 % (ref 11.5–15.5)
WBC: 4.7 10*3/uL (ref 4.0–10.5)
WBC: 5.4 10*3/uL (ref 4.0–10.5)
nRBC: 0 % (ref 0.0–0.2)
nRBC: 0 % (ref 0.0–0.2)

## 2022-12-12 LAB — COMPREHENSIVE METABOLIC PANEL
ALT: 26 U/L (ref 0–44)
ALT: 31 U/L (ref 0–44)
AST: 24 U/L (ref 15–41)
AST: 28 U/L (ref 15–41)
Albumin: 4.3 g/dL (ref 3.5–5.0)
Albumin: 5 g/dL (ref 3.5–5.0)
Alkaline Phosphatase: 81 U/L (ref 38–126)
Alkaline Phosphatase: 82 U/L (ref 38–126)
Anion gap: 13 (ref 5–15)
Anion gap: 9 (ref 5–15)
BUN: 85 mg/dL — ABNORMAL HIGH (ref 8–23)
BUN: 88 mg/dL — ABNORMAL HIGH (ref 8–23)
CO2: 16 mmol/L — ABNORMAL LOW (ref 22–32)
CO2: 16 mmol/L — ABNORMAL LOW (ref 22–32)
Calcium: 8.5 mg/dL — ABNORMAL LOW (ref 8.9–10.3)
Calcium: 9 mg/dL (ref 8.9–10.3)
Chloride: 110 mmol/L (ref 98–111)
Chloride: 114 mmol/L — ABNORMAL HIGH (ref 98–111)
Creatinine, Ser: 4.26 mg/dL — ABNORMAL HIGH (ref 0.61–1.24)
Creatinine, Ser: 4.84 mg/dL — ABNORMAL HIGH (ref 0.61–1.24)
GFR, Estimated: 12 mL/min — ABNORMAL LOW (ref >60)
GFR, Estimated: 14 mL/min — ABNORMAL LOW (ref >60)
Glucose, Bld: 127 mg/dL — ABNORMAL HIGH (ref 70–99)
Glucose, Bld: 129 mg/dL — ABNORMAL HIGH (ref 70–99)
Potassium: 4.9 mmol/L (ref 3.5–5.1)
Potassium: 5.6 mmol/L — ABNORMAL HIGH (ref 3.5–5.1)
Sodium: 139 mmol/L (ref 135–145)
Sodium: 139 mmol/L (ref 135–145)
Total Bilirubin: 0.8 mg/dL (ref 0.3–1.2)
Total Bilirubin: 0.8 mg/dL (ref 0.3–1.2)
Total Protein: 7.3 g/dL (ref 6.5–8.1)
Total Protein: 8.4 g/dL — ABNORMAL HIGH (ref 6.5–8.1)

## 2022-12-12 LAB — RAPID URINE DRUG SCREEN, HOSP PERFORMED
Amphetamines: NOT DETECTED
Barbiturates: NOT DETECTED
Benzodiazepines: NOT DETECTED
Cocaine: POSITIVE — AB
Opiates: NOT DETECTED
Tetrahydrocannabinol: NOT DETECTED

## 2022-12-12 LAB — URINALYSIS, ROUTINE W REFLEX MICROSCOPIC
Bacteria, UA: NONE SEEN
Bilirubin Urine: NEGATIVE
Glucose, UA: NEGATIVE mg/dL
Ketones, ur: 5 mg/dL — AB
Leukocytes,Ua: NEGATIVE
Nitrite: NEGATIVE
Protein, ur: 100 mg/dL — AB
Specific Gravity, Urine: 1.014 (ref 1.005–1.030)
pH: 5 (ref 5.0–8.0)

## 2022-12-12 LAB — GLUCOSE, CAPILLARY: Glucose-Capillary: 143 mg/dL — ABNORMAL HIGH (ref 70–99)

## 2022-12-12 LAB — CBG MONITORING, ED
Glucose-Capillary: 139 mg/dL — ABNORMAL HIGH (ref 70–99)
Glucose-Capillary: 114 mg/dL — ABNORMAL HIGH (ref 70–99)

## 2022-12-12 LAB — LIPASE, BLOOD: Lipase: 53 U/L — ABNORMAL HIGH (ref 11–51)

## 2022-12-12 LAB — TROPONIN I (HIGH SENSITIVITY)
Troponin I (High Sensitivity): 33 ng/L — ABNORMAL HIGH (ref <18)
Troponin I (High Sensitivity): 38 ng/L — ABNORMAL HIGH (ref <18)

## 2022-12-12 LAB — MAGNESIUM: Magnesium: 2.3 mg/dL (ref 1.7–2.4)

## 2022-12-12 MED ORDER — OXYCODONE HCL 5 MG PO TABS
5.0000 mg | ORAL_TABLET | ORAL | Status: AC | PRN
  Administered 2022-12-12 – 2022-12-16 (×5): 5 mg via ORAL
  Filled 2022-12-12 (×5): qty 1

## 2022-12-12 MED ORDER — DULOXETINE HCL 20 MG PO CPEP
20.0000 mg | ORAL_CAPSULE | Freq: Every day | ORAL | Status: DC
  Filled 2022-12-12: qty 1

## 2022-12-12 MED ORDER — ONDANSETRON HCL 4 MG/2ML IJ SOLN: 4.0000 mg | Freq: Four times a day (QID) | INTRAMUSCULAR | Status: DC | PRN

## 2022-12-12 MED ORDER — INSULIN ASPART 100 UNIT/ML IJ SOLN
0.0000 [IU] | Freq: Three times a day (TID) | INTRAMUSCULAR | Status: DC
  Administered 2022-12-12 – 2022-12-14 (×4): 1 [IU] via SUBCUTANEOUS
  Administered 2022-12-15 – 2022-12-17 (×4): 2 [IU] via SUBCUTANEOUS
  Administered 2022-12-18 – 2022-12-20 (×6): 1 [IU] via SUBCUTANEOUS
  Administered 2022-12-21 – 2022-12-22 (×3): 2 [IU] via SUBCUTANEOUS
  Administered 2022-12-22: 1 [IU] via SUBCUTANEOUS
  Administered 2022-12-23: 3 [IU] via SUBCUTANEOUS
  Filled 2022-12-12: qty 0.09

## 2022-12-12 MED ORDER — FLUOXETINE HCL 20 MG PO CAPS
20.0000 mg | ORAL_CAPSULE | Freq: Every day | ORAL | Status: DC
  Administered 2022-12-13 – 2022-12-23 (×11): 20 mg via ORAL
  Filled 2022-12-12 (×11): qty 1

## 2022-12-12 MED ORDER — ACETAMINOPHEN 650 MG RE SUPP: 650.0000 mg | Freq: Four times a day (QID) | RECTAL | Status: DC | PRN

## 2022-12-12 MED ORDER — SODIUM ZIRCONIUM CYCLOSILICATE 10 G PO PACK
10.0000 g | PACK | Freq: Once | ORAL | Status: AC
  Administered 2022-12-12: 10 g via ORAL
  Filled 2022-12-12: qty 1

## 2022-12-12 MED ORDER — MELATONIN 3 MG PO TABS
3.0000 mg | ORAL_TABLET | Freq: Every evening | ORAL | Status: DC | PRN
  Administered 2022-12-12 – 2022-12-22 (×8): 3 mg via ORAL
  Filled 2022-12-12 (×8): qty 1

## 2022-12-12 MED ORDER — NALOXONE HCL 0.4 MG/ML IJ SOLN: 0.4000 mg | INTRAMUSCULAR | Status: DC | PRN

## 2022-12-12 MED ORDER — LORAZEPAM 1 MG PO TABS
1.0000 mg | ORAL_TABLET | Freq: Once | ORAL | Status: AC
  Administered 2022-12-12: 1 mg via ORAL
  Filled 2022-12-12: qty 1

## 2022-12-12 MED ORDER — LACTATED RINGERS IV SOLN: INTRAVENOUS | Status: AC

## 2022-12-12 MED ORDER — QUETIAPINE FUMARATE 50 MG PO TABS
50.0000 mg | ORAL_TABLET | Freq: Every day | ORAL | Status: DC
  Administered 2022-12-12 – 2022-12-22 (×11): 50 mg via ORAL
  Filled 2022-12-12 (×11): qty 1

## 2022-12-12 MED ORDER — SODIUM CHLORIDE 0.9 % IV BOLUS
500.0000 mL | Freq: Once | INTRAVENOUS | Status: AC
  Administered 2022-12-12: 500 mL via INTRAVENOUS

## 2022-12-12 MED ORDER — DILTIAZEM HCL ER COATED BEADS 120 MG PO CP24
120.0000 mg | ORAL_CAPSULE | Freq: Every day | ORAL | Status: DC
  Administered 2022-12-12 – 2022-12-23 (×12): 120 mg via ORAL
  Filled 2022-12-12 (×12): qty 1

## 2022-12-12 MED ORDER — ASPIRIN 81 MG PO CHEW
324.0000 mg | CHEWABLE_TABLET | Freq: Once | ORAL | Status: AC
  Administered 2022-12-12: 324 mg via ORAL
  Filled 2022-12-12: qty 4

## 2022-12-12 MED ORDER — ACETAMINOPHEN 325 MG PO TABS
650.0000 mg | ORAL_TABLET | Freq: Four times a day (QID) | ORAL | Status: DC | PRN
  Administered 2022-12-15 – 2022-12-22 (×5): 650 mg via ORAL
  Filled 2022-12-12 (×5): qty 2

## 2022-12-12 MED ORDER — ISOSORBIDE MONONITRATE ER 30 MG PO TB24
15.0000 mg | ORAL_TABLET | Freq: Every day | ORAL | Status: DC
  Administered 2022-12-12 – 2022-12-23 (×12): 15 mg via ORAL
  Filled 2022-12-12 (×12): qty 1

## 2022-12-12 MED ORDER — FENTANYL CITRATE PF 50 MCG/ML IJ SOSY: 25.0000 ug | PREFILLED_SYRINGE | INTRAMUSCULAR | Status: DC | PRN

## 2022-12-12 MED ORDER — HYDRALAZINE HCL 20 MG/ML IJ SOLN
10.0000 mg | Freq: Once | INTRAMUSCULAR | Status: AC
  Administered 2022-12-12: 10 mg via INTRAVENOUS
  Filled 2022-12-12: qty 1

## 2022-12-12 NOTE — ED Triage Notes (Signed)
Patient brought in by [facility], was standing at police station and reported he was suicidal. Endorses past attempt less than a year ago by slitting wrists. Denies HI/+AVH. EMS reports patient smoked crack 2 hours ago as well.

## 2022-12-12 NOTE — ED Notes (Addendum)
Pt. In gown and Wanded by security. Pt. Belongings locked up in cabinet 9-12 behind the nurses station. Pt. Has 2 belongings bags. Pt. Has 1 pr. Black shoes, 1 gray sweat pant and 1 green shirt. Pt. Has no cell phone and no wallet.

## 2022-12-12 NOTE — Consult Note (Signed)
[facility] Followup Face-to-Face Psychiatric Evaluation  Service Date: December 12, 2022 LOS:  LOS: 0 days   Assessment  Michael [name] is a 72 y.o. male admitted medically for 12/11/2022 11:52 PM for AKI on CKD stage IIIb and chest pain. He carries the psychiatric diagnoses of major depressive disorder, cocaine use disorder and has a past medical history of CAD s/p CABG and stenting, HTN, HFrEF with recovered LVEF 60-65%, HIV, T2DM, gout, HLD, PAF on eliquis. Psychiatry was consulted for suicidal ideation by Dr. Olevia Bowens.   His current presentation of suicidal ideation with no plan, inconsistency with suicidal ideation and in the setting of recent cocaine use is most consistent with substance-induced mood disorder. He meets criteria for substance-induced mood disorder based on recent worsening mood in the setting of recent cocaine use. He endorses daily use of cocaine. He does report this most recent use, developed chest pain which scared him.  Current outpatient psychotropic medications include prozac 20, and seroquel '50mg'$  po qhs. and historically he has been non-compliant with medications prior to admission which makes it difficult to assess his response. This is evidenced by his inconsistent fill history. On initial examination, patient does not fully engage with interviewer, reports that he is tired and closes his eyes multiple times. He does have prior diagnosis of major depressive disorder and in the setting of his acute on chronic kidney injury. No dosing adjustment for prozac is required in setting of renal insufficiency. Given his current reported SI with no plan, will not recommend 1:1 sitter, he is future oriented, denies a plan, and historically will notify staff if suicidality changes. Furthermore his suicidal ideations generally resolves with cessation of his substance use.  Please see plan below for detailed recommendations.   Patient continues to endorse chronic SI (and noted  in chart review) but is currently denying SI and contracting for safety. We recommend discontinuing 1:1 given his chronic SI in the setting of unstable housing, chronic substance use, and chronic medical conditions. His depression appears to be stable at this time and would continue his prozac at current dose. He is interested in inpatient rehab for his substance use and we would prefer for him to have a door-to-door to inpatient substance use rehab from this hospitalization.     On evaluation patient endorses passive suicidal ideations as of today. He continues to report fleeting suicidal ideations "they come and go."  He reports feeling overall better, which is consistent with an improvement in his mood and affect.  He does brighten upon approach, engages well, and speaks in a whispered tone. He smiles and jokes throughout the evaluation.   He continues to remain motivated to seek inpatient residential rehab.  He prefers going straight to the rehabilitation facility.  He denies any urges, cravings, and or withdrawal symptoms related to his chronic cocaine use.  He remains cooperative with staff, has not required any as needed medications for behavioral concerns.  He was also restarted on home medications, denies any side effects and or acute adverse reactions.  He reports his sleep is okay and his appetite is up and down.    Diagnoses:   Active Hospital problems: Principal Problem:   AKI (acute kidney injury) ([place]) Active Problems:   HIV (human immunodeficiency virus infection) ([place])   Cocaine use disorder ([place])   Hyperlipidemia with target LDL less than 100   Suicidal ideations   Essential hypertension   Precordial chest pain   Type II diabetes mellitus with  renal manifestations ([name])   GERD (gastroesophageal reflux disease)   S/P CABG (coronary artery bypass graft)   Paroxysmal A-fib ([name])   MDD (major depressive disorder), recurrent episode, moderate ([name])   COPD (chronic obstructive  pulmonary disease) ([name])   Anemia of chronic disease    Plan  ## Safety and Observation Level:  - Based on my clinical evaluation, I estimate the patient to be at low acute risk of self harm in the current setting, high chronic risk of self-harm - At this time, we recommend a routine level of observation. This decision is based on my review of the chart including patient's history and current presentation, interview of the patient, mental status examination, and consideration of suicide risk including evaluating suicidal ideation, plan, intent, suicidal or self-harm behaviors, risk factors, and protective factors. This judgment is based on our ability to directly address suicide risk, implement suicide prevention strategies and develop a safety plan while the patient is in the clinical setting. Please contact our team if there is a concern that risk level has changed.  ## Medications:  -- CONTINUE prozac '20mg'$  for depression and Seroquel '50mg'$  po daily. Will dc Duloxetine, patient not clear when or who started this medications.   ## Medical Decision Making Capacity:  -- Not formally assessed   ## Further Work-up:  -- Per primary   -- most recent EKG on 3/7 had QtC of 32 -- Pertinent labwork reviewed earlier this admission includes: Cr 4.32, Hgb 9.0, UDS+cocaine, [id number], salicylate<7. Renal U/S normal   ## Disposition:  -- Prefer patient have door-to-door for inpatient rehab  ## Behavioral / Environmental:  --routine   ##Legal Status -Voluntary  Thank you for this consult request. Recommendations have been communicated to the primary team.  We will sign off at this time.   Suella Broad, FNP,    Follow-up history  Relevant Aspects of Hospital Course:  Admitted on 12/11/2022 for AKI on CKD stage IIIb.  Patient was recently admitted to [facility] for chest pain rule out. Seen by cardiology who recommended medical management, repeat testing, and followup. Noted to have  difficulty with adherence and follow-up.    Patient Report:  Patient does not currently present with active symptoms of an acute manic or psychotic episode and he is denying SI/HI this morning. Patient does not meet involuntary commitment criteria at this time. Combination of personality structure and chronic substance abuse leading to impulsivity and mood dysregulation increase overall risk of self-harm. However, at this time, his mood is stable, the patient is future oriented. There is no indication of dangerousness to self/others. Will recommend psychiatrically clear and patient and assisting with door to door inpatient substance abuse rehabilitation facility when medically stable.   He is alert and oriented x 4.  His physical appearance is casual and appropriate for hospital environment.  He is calm, cooperative, communicated well with normal rate and normal volume.  His mood he describes as "okay".  Chronic suicide risk factors prior attempts, traumatic past, stressful life events, mental illness, chronic medical conditions and unemployment Acute suicide risk factors aggression , non-adherence to care , impulsivity, mood and personality changes , substance use and hopeless Suicide protective factors Male, minor child, willingness to seek help, religion/spiritual.  Given above risks versus protective factors patients current acute risk for harm to self at this time is mitigated; but elevated in term of substance abuse. Given above risks versus protective factors patients current chronic risk for harm to self at this  time is moderate to severe in context of ongoing substance use, but mitigated in terms of intentional imminent self-harm/suicide  Violence/assault/Homicide Risk Assessment: No risk factors. Protective factors include no identified conflict with others. In addition patient makes no homicidal statements. No history of assault. Patient is low risk of imminent harm to  others/homicide.  Suicide Risk Assessment:  Resources: Patient instructed to call 911/crisis if medical/psychiatric emergency of psychosis/SI/HI.    ROS:  As above   Collateral information:  Patient declines for anyone to be contacted.   Psychiatric History:  Information collected from chart review  Patient was at inpatient psychiatric hospital at [facility] 11/30/2022. At the time, was diagnosed with MDD and discharged with Duloxetine 20 and quetapine '50mg'$ . He was admitted for risk of self-injury and medication non-compliance.   Family psych history: None noted in chart   Social History:  Tobacco use: Denies Alcohol use: Denies Drug use: Reports cocaine use. Last use a week ago. Has been on and off for the past 30 years.   Completed up to 12th grade.  He is widowed.  Has 2 children (per chart review) Reports he is on disability, lives by himself and is homeless but sleeps at hotels.  Grew up in [place], [place] then went to [place], then got a bus back to [place].   Family History:  The patient's family history includes Cancer in his father; Diabetes in his brother, father, mother, and sister; Heart disease in his brother; Hyperlipidemia in his mother; Hypertension in his father and mother.  Medical History: Past Medical History:  Diagnosis Date   A-fib ([place])    Anemia    Asthma    No PFTs, history of childhood asthma   CAD (coronary artery disease)    Cellulitis 04/2014   left facial   Cellulitis and abscess of toe of right foot 12/08/2019   Chondromalacia of medial femoral condyle    Left knee MRI 04/28/12: Chondromalacia of the medial femoral condyle with slight peripheral degeneration of the meniscocapsular junction of the medial meniscus; followed by sports medicine   Chronic kidney failure, stage 4 (severe) ([place])    Collagen vascular disease ([place])    Crack cocaine use    for 20+ years, has been enrolled in detox programs in the past   Depression    with history of  hospitalization for suicidal ideation   Diabetes mellitus 2002   Diagnosed in 2002, started insulin in 2012   Gout    Headache(784.0)    CT head 08/2011: Periventricular and subcortical white matter hypodensities are most in keeping with chronic microangiopathic change   HIV infection ([place]) 08/2011   Followed by [name]   Hyperlipidemia    Hypertension    Pulmonary embolism [facility])     Surgical History: Past Surgical History:  Procedure Laterality Date   AMPUTATION Right 07/21/2019   Procedure: RIGHT SECOND TOE AMPUTATION;  Surgeon: [name], MD;  Location: [facility];  Service: Orthopedics;  Laterality: Right;   BACK SURGERY     1988   BOWEL RESECTION     CARDIAC SURGERY     CERVICAL SPINE SURGERY     " rods in my neck "   CORONARY ARTERY BYPASS GRAFT     CORONARY STENT PLACEMENT     NM MYOCAR PERF WALL MOTION  12/27/2011   normal   SPINE SURGERY      Medications:   Current Facility-Administered Medications:    acetaminophen (TYLENOL) tablet 650 mg, 650 mg,  Oral, Q6H PRN **OR** acetaminophen (TYLENOL) suppository 650 mg, 650 mg, Rectal, Q6H PRN, Howerter, Justin B, DO   diltiazem (CARDIZEM CD) 24 hr capsule 120 mg, 120 mg, Oral, Daily, Reubin Milan, MD, 120 mg at 12/12/22 [id number]   DULoxetine (CYMBALTA) DR capsule 20 mg, 20 mg, Oral, Daily, Reubin Milan, MD   fentaNYL (SUBLIMAZE) injection 25 mcg, 25 mcg, Intravenous, Q2H PRN, Howerter, Justin B, DO   insulin aspart (novoLOG) injection 0-9 Units, 0-9 Units, Subcutaneous, TID WC, Howerter, Justin B, DO, 1 Units at 12/12/22 [id number]   isosorbide mononitrate (IMDUR) 24 hr tablet 15 mg, 15 mg, Oral, Daily, Reubin Milan, MD, 15 mg at 12/12/22 [id number]   melatonin tablet 3 mg, 3 mg, Oral, QHS PRN, Howerter, Justin B, DO   naloxone (NARCAN) injection 0.4 mg, 0.4 mg, Intravenous, PRN, Howerter, Justin B, DO   ondansetron (ZOFRAN) injection 4 mg, 4 mg, Intravenous, Q6H PRN, Howerter, Justin B, DO   oxyCODONE (Oxy  IR/ROXICODONE) immediate release tablet 5 mg, 5 mg, Oral, Q4H PRN, Reubin Milan, MD   QUEtiapine (SEROQUEL) tablet 50 mg, 50 mg, Oral, QHS, Reubin Milan, MD  Allergies: No Known Allergies   Objective  Vital signs:  Temp:  [97.9 F (36.6 C)-100.4 F (38 C)] 98 F (36.7 C) (03/07 1444) Pulse Rate:  [74-95] 75 (03/07 1444) Resp:  [12-24] 16 (03/07 1444) BP: (124-200)/(75-113) 124/75 (03/07 1444) SpO2:  [98 %-100 %] 100 % (03/07 1444) Weight:  [70.8 kg] 70.8 kg (03/07 0010)  Mental Status Exam   Appearance and Grooming: Patient is laying on his side in hospital bed. Alert during most of the interview. The patient has no noticeable scent or odor. Motor activity: The patient's movement speed and his gait was not observed during encounter. There was no notable abnormal facial movements and no notable abnormal extremity movements. Behavior: The patient appears in no acute distress, and during the interview, was calm; he was able to follow commands and compliant to requests and made good eye contact. The patient did not appear internally or externally preoccupied. Attitude: Patient's attitude towards the interviewer was cooperative. Speech: The patient's speech was fluent, with good articulation, and with appropriately placed inflections. The volume of his speech was normal and normal in quantity. The rate was normal with a normal rhythm. Responses were normal in latency. There were no abnormal patterns in speech. Mood: "Fair - a little better" Affect: Patient's affect is euthymic with broad range and even fluctuations; his affect is congruent with his stated mood. ------------------------------------------------------------------------------------------------------------------------- Thought Content The patient experiences no hallucinations. The patient describes no delusional thoughts; he denies thought insertion, denies thought withdrawal, denies thought interruption, and  denies thought broadcasting. Patient at the time of interview endorses passive suicidal ideation, stating "it comes and goes" and denies active suicidal intent; he denies homicidal intent. Thought Process The patient's thought process is linear and is goal-directed. Insight The patient at the time of interview demonstrates fair insight  Judgement The patient over the past 24 hours demonstrates fair judgement  Memory: limited   Executive Functions  Concentration: Intact  Attention Span: Fair, can spell WORLD backwards.  Recall: Poor Fund of Knowledge: Not formally assessed  Alertness/Orientation: oriented to person, place, month   Assets  Assets: Armed forces logistics/support/administrative officer; Desire for Improvement; Financial Resources/Insurance; Resilience  Sleep  Sleep:Sleep: Good   Physical Exam Constitutional:      Appearance: the patient is laying on his side in the hospital bed, alert  Pulmonary:  Effort: Pulmonary effort is normal.  Neurological:     General: No focal deficit present.     Mental Status: the patient is oriented to person, place, month, year  Review of Systems  Respiratory:  Negative for shortness of breath.   Cardiovascular:  Negative for chest pain.  Gastrointestinal:  Negative for abdominal pain, constipation, diarrhea, nausea and vomiting.  Neurological:  Negative for headaches.  MSK: Positive for L sided pain that comes and goes   Blood pressure 132/80, pulse 80, temperature (!) 100.4 F (38 C), temperature source Oral, resp. rate 17, height '5\' 8"'$  (1.727 m), weight 70.8 kg, SpO2 100 %. Body mass index is 23.72 kg/m.

## 2022-12-12 NOTE — ED Provider Notes (Signed)
[place] [facility] Provider Note   CSN: [id number] Arrival date & time: 12/11/22  2349     History  Chief Complaint  Patient presents with   Suicidal    Michael [name] is a 72 y.o. male.  PMH significant for DM, HLD, HIV, HTN, Afib, CAD, crack cocaine use, CABG presents to the ED with chest pain and suicidal ideation.  Patient here with chest pain that has been an ongoing issue for him for quite some time which she states is now worse after smoking crack this evening about 2 hours ago.  States he always has chest pain and it never goes away but now it is worse.  Associate with shortness of breath.  No nausea, vomiting, cough or fever.  Denies any radiation of the pain to his back.  He states he has had ongoing chest pain since he had his CABG surgery several years ago. He reports the chest pain is constant but worse with palpation and movement.  He also feels suicidal with plan to slit his wrists.  Denies any homicidal ideation or hallucinations.  Denies any alcohol use.  Denies any IV drug abuse.  The history is provided by the patient.       Home Medications Prior to Admission medications   Medication Sig Start Date End Date Taking? Authorizing Provider  Accu-Chek Softclix Lancets lancets Use as directed up to four times daily 09/06/22   Elodia Florence., MD  albuterol (VENTOLIN HFA) 108 (90 Base) MCG/ACT inhaler Inhale 1-2 puffs into the lungs every 6 (six) hours as needed for wheezing or shortness of breath. Patient not taking: Reported on 11/14/2022 10/24/22   [place] Donnamarie Poag, DO  atorvastatin (LIPITOR) 40 MG tablet Take 1 tablet (40 mg total) by mouth daily. 12/09/22 04/08/23  Parks Ranger, DO  Blood Glucose Monitoring Suppl (ACCU-CHEK GUIDE) w/Device KIT Use as directed. 09/06/22   Elodia Florence., MD  diltiazem (CARDIZEM CD) 120 MG 24 hr capsule Take 1 capsule (120 mg total) by mouth daily. 12/09/22 01/08/23  Parks Ranger, DO  DULoxetine (CYMBALTA) 20 MG capsule Take 1 capsule (20 mg total) by mouth daily. 12/10/22   Parks Ranger, DO  fluticasone furoate-vilanterol (BREO ELLIPTA) 200-25 MCG/ACT AEPB Inhale 1 puff into the lungs daily. 12/10/22   Parks Ranger, DO  glucose blood test strip use as directed up to four times daily 09/06/22   Elodia Florence., MD  Insulin Pen Needle 32G X 4 MM MISC Use as directed up to 4 times daily. 09/06/22   Elodia Florence., MD  isosorbide mononitrate (IMDUR) 30 MG 24 hr tablet Take 0.5 tablets (15 mg total) by mouth daily. 12/10/22   Parks Ranger, DO  nitroGLYCERIN (NITROSTAT) 0.4 MG SL tablet Place 1 tablet (0.4 mg total) under the tongue every 5 (five) minutes as needed for chest pain. 12/09/22   Parks Ranger, DO  QUEtiapine (SEROQUEL) 50 MG tablet Take 1 tablet (50 mg total) by mouth at bedtime. 12/09/22   Parks Ranger, DO      Allergies    Patient has no known allergies.    Review of Systems   Review of Systems  Constitutional:  Negative for activity change, appetite change and fever.  HENT:  Negative for congestion.   Respiratory:  Positive for chest tightness. Negative for shortness of breath.   Gastrointestinal:  Negative for abdominal pain, nausea and vomiting.  Genitourinary:  Negative for dysuria, hematuria and penile discharge.  Musculoskeletal:  Negative for arthralgias and myalgias.  Skin:  Negative for rash.  Neurological:  Negative for weakness and headaches.  Psychiatric/Behavioral:  Positive for self-injury, sleep disturbance and suicidal ideas. The patient is nervous/anxious.     all other systems are negative except as noted in the HPI and PMH.   Physical Exam Updated Vital Signs BP (!) 185/113 (BP Location: Right Arm)   Pulse 87   Temp 98.4 F (36.9 C) (Oral)   Resp 18   Ht '5\' 8"'$  (1.727 m)   Wt 70.8 kg   SpO2 100%   BMI 23.72 kg/m  Physical Exam Vitals and nursing note  reviewed.  Constitutional:      General: He is not in acute distress.    Appearance: He is well-developed.  HENT:     Head: Normocephalic and atraumatic.     Mouth/Throat:     Pharynx: No oropharyngeal exudate.  Eyes:     Conjunctiva/sclera: Conjunctivae normal.     Pupils: Pupils are equal, round, and reactive to light.  Neck:     Comments: No meningismus. Cardiovascular:     Rate and Rhythm: Normal rate and regular rhythm.     Heart sounds: Normal heart sounds. No murmur heard. Pulmonary:     Effort: Pulmonary effort is normal. No respiratory distress.     Breath sounds: Normal breath sounds.  Chest:     Chest wall: Tenderness present.  Abdominal:     Palpations: Abdomen is soft.     Tenderness: There is no abdominal tenderness. There is no guarding or rebound.  Musculoskeletal:        General: No tenderness. Normal range of motion.     Cervical back: Normal range of motion and neck supple.     Comments: Equal radial pulses and grip strengths  Skin:    General: Skin is warm.  Neurological:     Mental Status: He is alert and oriented to person, place, and time.     Cranial Nerves: No cranial nerve deficit.     Motor: No abnormal muscle tone.     Coordination: Coordination normal.     Comments:  5/5 strength throughout. CN 2-12 intact.Equal grip strength.   Psychiatric:        Behavior: Behavior normal.     ED Results / Procedures / Treatments   Labs (all labs ordered are listed, but only abnormal results are displayed) Labs Reviewed  CBC WITH DIFFERENTIAL/PLATELET - Abnormal; Notable for the following components:      Result Value   RBC 3.69 (*)    Hemoglobin 10.5 (*)    HCT 33.8 (*)    All other components within normal limits  COMPREHENSIVE METABOLIC PANEL - Abnormal; Notable for the following components:   Potassium 5.6 (*)    CO2 16 (*)    Glucose, Bld 129 (*)    BUN 88 (*)    Creatinine, Ser 4.84 (*)    Total Protein 8.4 (*)    GFR, Estimated 12 (*)     All other components within normal limits  LIPASE, BLOOD - Abnormal; Notable for the following components:   Lipase 53 (*)    All other components within normal limits  TROPONIN I (HIGH SENSITIVITY) - Abnormal; Notable for the following components:   Troponin I (High Sensitivity) 38 (*)    All other components within normal limits  RAPID URINE DRUG SCREEN, HOSP PERFORMED  URINALYSIS, ROUTINE  W REFLEX MICROSCOPIC  CBC WITH DIFFERENTIAL/PLATELET  COMPREHENSIVE METABOLIC PANEL  MAGNESIUM  TROPONIN I (HIGH SENSITIVITY)    EKG EKG Interpretation  Date/Time:  Thursday December 12 2022 00:34:43 EST Ventricular Rate:  84 PR Interval:  146 QRS Duration: 79 QT Interval:  365 QTC Calculation: 432 R Axis:   72 Text Interpretation: Sinus rhythm Consider left ventricular hypertrophy Anterior Q waves, possibly due to LVH Nonspecific T abnormalities, lateral leads No significant change was found Confirmed by [name] [phone]) on 12/12/2022 12:45:08 AM  Radiology DG Chest 2 View  Result Date: 12/12/2022 CLINICAL DATA:  Suicidal ideation. EXAM: CHEST - 2 VIEW COMPARISON:  AP Lat 11/26/2022 FINDINGS: There are CABG changes, coronary artery stents in left atrial appendage closure device. The mediastinum is normally outlined. There is aortic atherosclerosis. The cardiac size is normal The lungs are clear. There is a partially visible lower cervical ACDF plating. Osteopenia and thoracic spondylosis. Comparison to the prior study reveals no significant interval change. IMPRESSION: No active cardiopulmonary disease. CABG changes. Aortic atherosclerosis. Electronically Signed   By: Telford Nab M.D.   On: 12/12/2022 00:39    Procedures .Critical Care  Performed by: [name], MD Authorized by: [name], MD   Critical care provider statement:    Critical care time (minutes):  35   Critical care time was exclusive of:  Separately billable procedures and treating other patients    Critical care was necessary to treat or prevent imminent or life-threatening deterioration of the following conditions: Hypertensive urgency, AKI, hyperkalemia.   Critical care was time spent personally by me on the following activities:  Development of treatment plan with patient or surrogate, discussions with consultants, evaluation of patient's response to treatment, examination of patient, ordering and review of laboratory studies, ordering and review of radiographic studies, ordering and performing treatments and interventions, pulse oximetry, re-evaluation of patient's condition, review of old charts, blood draw for specimens and obtaining history from patient or surrogate   I assumed direction of critical care for this patient from another provider in my specialty: no     Care discussed with: admitting provider       Medications Ordered in ED Medications  LORazepam (ATIVAN) tablet 1 mg (has no administration in time range)    ED Course/ Medical Decision Making/ A&P                             Medical Decision Making Amount and/or Complexity of Data Reviewed Independent Historian: EMS Labs: ordered. Decision-making details documented in ED Course. Radiology: ordered and independent interpretation performed. Decision-making details documented in ED Course. ECG/medicine tests: ordered and independent interpretation performed. Decision-making details documented in ED Course.  Risk OTC drugs. Prescription drug management. Decision regarding hospitalization.   Acute on chronic central chest pain worse after smoking crack this evening.  Hypertensive on arrival.  States he has chronic chest pain but never goes away but is not worse tonight.  Also suicidal with plan to slit wrists  EKG shows LVH without acute ST changes.  Somewhat peaked T waves.  Troponin mildly positive at 38.  Creatinine 4.8 which is elevated from his baseline in the 3.5 range.  Patient states he has been poorly  compliant with his medications.  Still having ongoing chest pain in the setting of cocaine abuse. He is hypertensive with chest pain rating to his back and abdomen.  Creatinine precludes use of IV contrast.  Will consider  MRA to evaluate for possibility of aortic aneurysm or dissection.  Dose of Lokelma given for elevated potassium.  Plan admission to the hospital for acute on chronic renal failure.  Troponin slightly elevated which appears to be within his usual range.  Will cycle troponins.  Gentle IVF given. Will give dose of hydralazine for elevated blood pressure.  Cycle troponins.  MRA will be obtained to evaluate for aortic pathology but there is low suspicion for acute aortic dissection.  Do not feel patient needs emergent transfer.  Admission discussed with Dr. Velia Meyer.       Final Clinical Impression(s) / ED Diagnoses Final diagnoses:  None    Rx / DC Orders ED Discharge Orders     None         Vika Buske, Annie Main, MD 12/12/22 [phone]

## 2022-12-12 NOTE — Progress Notes (Signed)
  Carryover admission to the Day Admitter.  I discussed this case with the EDP, Dr. Wyvonnia Dusky.  Per these discussions:   This is a 72 year old male with type 2 diabetes mellitus, HIV, chronic chest pain, who is being admitted with acute kidney injury superimposed on CKD 3B after presenting with suicidal ideation.  Has a reported history of recurrent suicidal ideation, presenting this evening with active suicidal ideation with plan,, conveying plan to cut his wrists.  He notes that he also snorted cocaine earlier today.  He reports this prompted a slight worsening of his chronic substernal chest discomfort.   Vital signs notable for the following: Afebrile, systolic blood pressures in the 180s.  Presenting labs notable for creatinine 4.8 compared to baseline creatinine in the mid threes.  Potassium 5.6 for which she received a dose of Lokelma as well as some IV fluids.  Initial high-sensitivity troponin I was found to be 38, with repeat pending.  EKG shows no reported evidence of acute ischemic changes, including no ST elevation.  Chest x-ray reportedly is clear.  He has received a full dose aspirin.   I have placed an order for observation to PCU for the following.  I have placed some additional preliminary admit orders via the adult multi-morbid admission order set. I have also ordered continuous lactated Ringer's.  For his acute on chronic chest pain, ordered as needed fentanyl, echocardiogram the morning.  Also ordered suicide precautions.  For his diabetes, ordered Accu-Cheks before every meal and at bedtime with sliding scale coverage.  Also ordered morning labs in the form of CMP, CBC, and serum magnesium level.    Babs Bertin, DO Hospitalist

## 2022-12-12 NOTE — Progress Notes (Signed)
  Echocardiogram 2D Echocardiogram has been performed.  Michael [name] 12/12/2022, 3:52 PM

## 2022-12-12 NOTE — ED Notes (Signed)
ED TO INPATIENT HANDOFF REPORT  ED Nurse Name and Phone #: Ronalee Belts [phone]  S Name/Age/Gender Michael [name] 72 y.o. male Room/Bed: WA24/WA24  Code Status   Code Status: Full Code  Home/SNF/Other Home Patient oriented to: self, place, time, and situation Is this baseline? Yes   Triage Complete: Triage complete  Chief Complaint AKI (acute kidney injury) [facility]) [N17.9]  Triage Note Patient brought in by [facility], was standing at police station and reported he was suicidal. Endorses past attempt less than a year ago by slitting wrists. Denies HI/+AVH. EMS reports patient smoked crack 2 hours ago as well.    Allergies No Known Allergies  Level of Care/Admitting Diagnosis ED Disposition     ED Disposition  Admit   Condition  --   Comment  Hospital Area: [place] [[id number]]  Level of Care: Progressive [102]  Admit to Progressive based on following criteria: MULTISYSTEM THREATS such as stable sepsis, metabolic/electrolyte imbalance with or without encephalopathy that is responding to early treatment.  May place patient in observation at [facility] or [place] if equivalent level of care is available:: No  Covid Evaluation: Asymptomatic - no recent exposure (last 10 days) testing not required  Diagnosis: AKI (acute kidney injury) [facility]) [id number]  Admitting Physician: Rhetta Mura [id number]  Attending Physician: Rhetta Mura [id number]          B Medical/Surgery History Past Medical History:  Diagnosis Date   A-fib ([place])    Anemia    Asthma    No PFTs, history of childhood asthma   CAD (coronary artery disease)    Cellulitis 04/2014   left facial   Cellulitis and abscess of toe of right foot 12/08/2019   Chondromalacia of medial femoral condyle    Left knee MRI 04/28/12: Chondromalacia of the medial femoral condyle with slight peripheral degeneration of the meniscocapsular junction of the medial meniscus; followed by sports medicine    Chronic kidney failure, stage 4 (severe) ([place])    Collagen vascular disease ([place])    Crack cocaine use    for 20+ years, has been enrolled in detox programs in the past   Depression    with history of hospitalization for suicidal ideation   Diabetes mellitus 2002   Diagnosed in 2002, started insulin in 2012   Gout    Headache(784.0)    CT head 08/2011: Periventricular and subcortical white matter hypodensities are most in keeping with chronic microangiopathic change   HIV infection ([place]) 08/2011   Followed by [name]   Hyperlipidemia    Hypertension    Pulmonary embolism [facility])    Past Surgical History:  Procedure Laterality Date   AMPUTATION Right 07/21/2019   Procedure: RIGHT SECOND TOE AMPUTATION;  Surgeon: [name], MD;  Location: [place];  Service: Orthopedics;  Laterality: Right;   BACK SURGERY     1988   BOWEL RESECTION     CARDIAC SURGERY     CERVICAL SPINE SURGERY     " rods in my neck "   CORONARY ARTERY BYPASS GRAFT     CORONARY STENT PLACEMENT     NM MYOCAR PERF WALL MOTION  12/27/2011   normal   SPINE SURGERY       A IV Location/Drains/Wounds Patient Lines/Drains/Airways Status     Active Line/Drains/Airways     Name Placement date Placement time Site Days   Peripheral IV 12/12/22 20 G Anterior;Left Forearm 12/12/22  0020  Forearm  less than  1            Intake/Output Last 24 hours  Intake/Output Summary (Last 24 hours) at 12/12/2022 1334 Last data filed at 12/12/2022 0307 Gross per 24 hour  Intake 500 ml  Output --  Net 500 ml    Labs/Imaging Results for orders placed or performed during the hospital encounter of 12/11/22 (from the past 48 hour(s))  CBC with Differential     Status: Abnormal   Collection Time: 12/12/22 12:19 AM  Result Value Ref Range   WBC 5.4 4.0 - 10.5 K/uL   RBC 3.69 (L) 4.22 - 5.81 MIL/uL   Hemoglobin 10.5 (L) 13.0 - 17.0 g/dL   HCT 33.8 (L) 39.0 - 52.0 %   MCV 91.6 80.0 - 100.0 fL   MCH 28.5 26.0 - 34.0 pg    MCHC 31.1 30.0 - 36.0 g/dL   RDW 13.7 11.5 - 15.5 %   Platelets 163 150 - 400 K/uL   nRBC 0.0 0.0 - 0.2 %   Neutrophils Relative % 80 %   Neutro Abs 4.3 1.7 - 7.7 K/uL   Lymphocytes Relative 14 %   Lymphs Abs 0.8 0.7 - 4.0 K/uL   Monocytes Relative 6 %   Monocytes Absolute 0.3 0.1 - 1.0 K/uL   Eosinophils Relative 0 %   Eosinophils Absolute 0.0 0.0 - 0.5 K/uL   Basophils Relative 0 %   Basophils Absolute 0.0 0.0 - 0.1 K/uL   Immature Granulocytes 0 %   Abs Immature Granulocytes 0.02 0.00 - 0.07 K/uL    Comment: Performed at [facility], [place] [address] [place], [place] [address]  Comprehensive metabolic panel     Status: Abnormal   Collection Time: 12/12/22 12:19 AM  Result Value Ref Range   Sodium 139 135 - 145 mmol/L   Potassium 5.6 (H) 3.5 - 5.1 mmol/L   Chloride 110 98 - 111 mmol/L   CO2 16 (L) 22 - 32 mmol/L   Glucose, Bld 129 (H) 70 - 99 mg/dL    Comment: Glucose reference range applies only to samples taken after fasting for at least 8 hours.   BUN 88 (H) 8 - 23 mg/dL   Creatinine, Ser 4.84 (H) 0.61 - 1.24 mg/dL   Calcium 9.0 8.9 - 10.3 mg/dL   Total Protein 8.4 (H) 6.5 - 8.1 g/dL   Albumin 5.0 3.5 - 5.0 g/dL   AST 28 15 - 41 U/L   ALT 31 0 - 44 U/L   Alkaline Phosphatase 81 38 - 126 U/L   Total Bilirubin 0.8 0.3 - 1.2 mg/dL   GFR, Estimated 12 (L) >60 mL/min    Comment: (NOTE) Calculated using the CKD-EPI Creatinine Equation (2021)    Anion gap 13 5 - 15    Comment: Performed at [facility], [place] [address] [place], [place] [address]  Lipase, blood     Status: Abnormal   Collection Time: 12/12/22 12:19 AM  Result Value Ref Range   Lipase 53 (H) 11 - 51 U/L    Comment: Performed at [facility], [place] [address] [place],  [address]  Troponin I (High Sensitivity)     Status: Abnormal   Collection Time: 12/12/22 12:19 AM  Result Value Ref Range   Troponin I (High Sensitivity) 38 (H) <18 ng/L     Comment: (NOTE) Elevated high sensitivity troponin I (hsTnI) values and significant  changes across serial measurements may suggest ACS but many other  chronic  and acute conditions are known to elevate hsTnI results.  Refer to the "Links" section for chest pain algorithms and additional  guidance. Performed at [facility], [place] [address], [place], [place] [address]   Rapid urine drug screen (hospital performed)     Status: Abnormal   Collection Time: 12/12/22  3:30 AM  Result Value Ref Range   Opiates NONE DETECTED NONE DETECTED   Cocaine POSITIVE (A) NONE DETECTED   Benzodiazepines NONE DETECTED NONE DETECTED   Amphetamines NONE DETECTED NONE DETECTED   Tetrahydrocannabinol NONE DETECTED NONE DETECTED   Barbiturates NONE DETECTED NONE DETECTED    Comment: (NOTE) DRUG SCREEN FOR MEDICAL PURPOSES ONLY.  IF CONFIRMATION IS NEEDED FOR ANY PURPOSE, NOTIFY LAB WITHIN 5 DAYS.  LOWEST DETECTABLE LIMITS FOR URINE DRUG SCREEN Drug Class                     Cutoff (ng/mL) Amphetamine and metabolites    1000 Barbiturate and metabolites    200 Benzodiazepine                 200 Opiates and metabolites        300 Cocaine and metabolites        300 THC                            50 Performed at [facility], [place] [address] [place], [place] [address]   Urinalysis, Routine w reflex microscopic -Urine, Clean Catch     Status: Abnormal   Collection Time: 12/12/22  3:30 AM  Result Value Ref Range   Color, Urine YELLOW YELLOW   APPearance CLEAR CLEAR   Specific Gravity, Urine 1.014 1.005 - 1.030   pH 5.0 5.0 - 8.0   Glucose, UA NEGATIVE NEGATIVE mg/dL   Hgb urine dipstick MODERATE (A) NEGATIVE   Bilirubin Urine NEGATIVE NEGATIVE   Ketones, ur 5 (A) NEGATIVE mg/dL   Protein, ur 100 (A) NEGATIVE mg/dL   Nitrite NEGATIVE NEGATIVE   Leukocytes,Ua NEGATIVE NEGATIVE   RBC / HPF 6-10 0 - 5 RBC/hpf   WBC, UA 0-5 0 - 5 WBC/hpf   Bacteria, UA NONE SEEN  NONE SEEN   Squamous Epithelial / HPF 0-5 0 - 5 /HPF   Hyaline Casts, UA PRESENT     Comment: Performed at [facility], [place] [address] [place], [place] [address]  Troponin I (High Sensitivity)     Status: Abnormal   Collection Time: 12/12/22  3:31 AM  Result Value Ref Range   Troponin I (High Sensitivity) 33 (H) <18 ng/L    Comment: (NOTE) Elevated high sensitivity troponin I (hsTnI) values and significant  changes across serial measurements may suggest ACS but many other  chronic and acute conditions are known to elevate hsTnI results.  Refer to the "Links" section for chest pain algorithms and additional  guidance. Performed at [facility], [place] [address] [place], [place] [address]   CBC with Differential/Platelet     Status: Abnormal   Collection Time: 12/12/22  7:44 AM  Result Value Ref Range   WBC 4.7 4.0 - 10.5 K/uL   RBC 3.21 (L) 4.22 - 5.81 MIL/uL   Hemoglobin 9.0 (L) 13.0 - 17.0 g/dL   HCT 29.8 (L) 39.0 - 52.0 %   MCV 92.8 80.0 - 100.0 fL   MCH 28.0 26.0 - 34.0 pg   MCHC 30.2 30.0 -  36.0 g/dL   RDW 13.4 11.5 - 15.5 %   Platelets 142 (L) 150 - 400 K/uL   nRBC 0.0 0.0 - 0.2 %   Neutrophils Relative % 69 %   Neutro Abs 3.2 1.7 - 7.7 K/uL   Lymphocytes Relative 22 %   Lymphs Abs 1.0 0.7 - 4.0 K/uL   Monocytes Relative 7 %   Monocytes Absolute 0.3 0.1 - 1.0 K/uL   Eosinophils Relative 1 %   Eosinophils Absolute 0.1 0.0 - 0.5 K/uL   Basophils Relative 1 %   Basophils Absolute 0.0 0.0 - 0.1 K/uL   Immature Granulocytes 0 %   Abs Immature Granulocytes 0.02 0.00 - 0.07 K/uL    Comment: Performed at [facility], [place] [address] [place], [place] [address]  Comprehensive metabolic panel     Status: Abnormal   Collection Time: 12/12/22  7:44 AM  Result Value Ref Range   Sodium 139 135 - 145 mmol/L   Potassium 4.9 3.5 - 5.1 mmol/L   Chloride 114 (H) 98 - 111 mmol/L   CO2 16 (L) 22 - 32 mmol/L   Glucose, Bld 127  (H) 70 - 99 mg/dL    Comment: Glucose reference range applies only to samples taken after fasting for at least 8 hours.   BUN 85 (H) 8 - 23 mg/dL   Creatinine, Ser 4.26 (H) 0.61 - 1.24 mg/dL   Calcium 8.5 (L) 8.9 - 10.3 mg/dL   Total Protein 7.3 6.5 - 8.1 g/dL   Albumin 4.3 3.5 - 5.0 g/dL   AST 24 15 - 41 U/L   ALT 26 0 - 44 U/L   Alkaline Phosphatase 82 38 - 126 U/L   Total Bilirubin 0.8 0.3 - 1.2 mg/dL   GFR, Estimated 14 (L) >60 mL/min    Comment: (NOTE) Calculated using the CKD-EPI Creatinine Equation (2021)    Anion gap 9 5 - 15    Comment: Performed at [facility], [place] [address], [place], [place] [address]  Magnesium     Status: None   Collection Time: 12/12/22  7:44 AM  Result Value Ref Range   Magnesium 2.3 1.7 - 2.4 mg/dL    Comment: Performed at [facility], [place] [address], [place],  [address]  CBG monitoring, ED     Status: Abnormal   Collection Time: 12/12/22  8:05 AM  Result Value Ref Range   Glucose-Capillary 139 (H) 70 - 99 mg/dL    Comment: Glucose reference range applies only to samples taken after fasting for at least 8 hours.  CBG monitoring, ED     Status: Abnormal   Collection Time: 12/12/22 12:21 PM  Result Value Ref Range   Glucose-Capillary 114 (H) 70 - 99 mg/dL    Comment: Glucose reference range applies only to samples taken after fasting for at least 8 hours.   MR ANGIO CHEST WO CONTRAST  Result Date: 12/12/2022 CLINICAL DATA:  Acute aortic syndrome suspected EXAM: MRA CHEST WITH OR WITHOUT CONTRAST TECHNIQUE: Angiographic images of the chest were obtained using MRA technique without intravenous contrast. CONTRAST:  None. COMPARISON:  None Available. FINDINGS: VASCULAR Aorta: 2 vessel aortic arch. The right brachiocephalic and left common carotid artery share a common origin. No evidence of aneurysm or dissection. Evaluation is slightly limited by both margin artifact and susceptibility artifact from prior  median sternotomy and cerclage wires. Heart: The heart is normal in size. Mild concentric hypertrophy of the left ventricular myocardium.  No pericardial effusion. Pulmonary Arteries:  The main pulmonary artery is normal in caliber. Other: None. NON-VASCULAR No significant focal signal abnormality within the chest or visualized upper abdomen. IMPRESSION: 1. No convincing evidence of aortic dissection or other acute aortic abnormality. 2. Concentric hypertrophy of the left ventricular myocardium consistent with the clinical history of hypertension. 3. Surgical changes of prior median sternotomy. Electronically Signed   By: Jacqulynn Cadet M.D.   On: 12/12/2022 13:24   DG Chest 2 View  Result Date: 12/12/2022 CLINICAL DATA:  Suicidal ideation. EXAM: CHEST - 2 VIEW COMPARISON:  AP Lat 11/26/2022 FINDINGS: There are CABG changes, coronary artery stents in left atrial appendage closure device. The mediastinum is normally outlined. There is aortic atherosclerosis. The cardiac size is normal The lungs are clear. There is a partially visible lower cervical ACDF plating. Osteopenia and thoracic spondylosis. Comparison to the prior study reveals no significant interval change. IMPRESSION: No active cardiopulmonary disease. CABG changes. Aortic atherosclerosis. Electronically Signed   By: Telford Nab M.D.   On: 12/12/2022 00:39    Pending Labs Unresulted Labs (From admission, onward)    None       Vitals/Pain Today's Vitals   12/12/22 1200 12/12/22 1230 12/12/22 1300 12/12/22 1315  BP: (!) 143/93 (!) 128/93 134/79 134/79  Pulse: 75 77 79 77  Resp: (!) 23 20 (!) 22 (!) 24  Temp: 97.9 F (36.6 C)     TempSrc:      SpO2: 100% 100% 100% 100%  Weight:      Height:      PainSc:        Isolation Precautions No active isolations  Medications Medications  acetaminophen (TYLENOL) tablet 650 mg (has no administration in time range)    Or  acetaminophen (TYLENOL) suppository 650 mg (has no  administration in time range)  insulin aspart (novoLOG) injection 0-9 Units ( Subcutaneous Not Given 12/12/22 1222)  melatonin tablet 3 mg (has no administration in time range)  naloxone (NARCAN) injection 0.4 mg (has no administration in time range)  fentaNYL (SUBLIMAZE) injection 25 mcg (has no administration in time range)  ondansetron (ZOFRAN) injection 4 mg (has no administration in time range)  lactated ringers infusion ( Intravenous New Bag/Given 12/12/22 0334)  oxyCODONE (Oxy IR/ROXICODONE) immediate release tablet 5 mg (has no administration in time range)  diltiazem (CARDIZEM CD) 24 hr capsule 120 mg (120 mg Oral Given 12/12/22 0926)  isosorbide mononitrate (IMDUR) 24 hr tablet 15 mg (15 mg Oral Given 12/12/22 0926)  LORazepam (ATIVAN) tablet 1 mg (1 mg Oral Given 12/12/22 0054)  hydrALAZINE (APRESOLINE) injection 10 mg (10 mg Intravenous Given 12/12/22 0107)  sodium chloride 0.9 % bolus 500 mL (0 mLs Intravenous Stopped 12/12/22 0307)  sodium zirconium cyclosilicate (LOKELMA) packet 10 g (10 g Oral Given 12/12/22 0107)  aspirin chewable tablet 324 mg (324 mg Oral Given 12/12/22 0331)    Mobility walks with person assist     Focused Assessments SI   R Recommendations: See Admitting Provider Note  Report given to:   Additional Notes: .

## 2022-12-12 NOTE — H&P (Signed)
History and Physical    Patient: Michael [name] [id number] DOB: [birth year] DOA: 12/11/2022 DOS: the patient was seen and examined on 12/12/2022 PCP: [name], MD  Patient coming from: Home  Chief Complaint:  Chief Complaint  Patient presents with   Suicidal   Chest Pain   HPI: Michael [name] is a 72 y.o. male with medical history significant of paroxysmal atrial fibrillation, unspecified anemia, asthma, CAD, history of facial cellulitis, medial femoral condyle chondromalacia, stage IV CKD, collagen vascular disease, history of crack cocaine use for over 20 years, depression, type 2 diabetes, gout, headaches, history of HIV infection, hyperlipidemia, hypertension, pulmonary embolism who was [place] via [place] police [place] due to worsening of chronic chest pain and suicidal ideation.  The patient was smoking crack cocaine 2 hours before prior to arrival.  He also stated he was suicidal with a plan to slit his wrists.  No homicidal ideations.  No palpitations, diaphoresis, nausea, emesis, PND, orthopnea or pitting edema lower extremities.  No abdominal pain, diarrhea, constipation, melena or hematochezia.  No dysuria, frequency or hematuria.   ED course: Initial vital signs were temperature 98.4 F, pulse 87, respiration 18, BP 185/113 mmHg and O2 sat 100% on room air.  The patient received aspirin 324 mg p.o., hydralazine 100 mg IVP, Lokelma 10 g IVP, NS 500 mL bolus followed by LR at 75 mL/h and lorazepam 1 mg oral.  Lab work: UDS was positive for cocaine.  His urinalysis had moderate hemoglobinuria, ketonuria 5 and proteinuria 100 mg/dL.  CBC showed a white count of 5.4, hemoglobin 10.5 g/dL platelets 163.  CMP showed potassium 5.6, sodium 139, chloride 110 and CO2 16 mmol/L with a normal anion gap.  Glucose 129, BUN 88 and creatinine 4.84 mg/dL.  Calcium was normal.  LFTs with a total protein 8.4 g/dL, the rest of the hepatic functions were normal.  Lipase 53 units/L.   Troponin 38 and 33 ng/L.  Imaging: 2 view chest radiograph with no active cardiopulmonary disease.  CABG changes.  Aortic atherosclerosis.  Review of Systems: As mentioned in the history of present illness. All other systems reviewed and are negative. Past Medical History:  Diagnosis Date   A-fib ([place])    Anemia    Asthma    No PFTs, history of childhood asthma   CAD (coronary artery disease)    Cellulitis 04/2014   left facial   Cellulitis and abscess of toe of right foot 12/08/2019   Chondromalacia of medial femoral condyle    Left knee MRI 04/28/12: Chondromalacia of the medial femoral condyle with slight peripheral degeneration of the meniscocapsular junction of the medial meniscus; followed by sports medicine   Chronic kidney failure, stage 4 (severe) ([place])    Collagen vascular disease ([place])    Crack cocaine use    for 20+ years, has been enrolled in detox programs in the past   Depression    with history of hospitalization for suicidal ideation   Diabetes mellitus 2002   Diagnosed in 2002, started insulin in 2012   Gout    Headache(784.0)    CT head 08/2011: Periventricular and subcortical white matter hypodensities are most in keeping with chronic microangiopathic change   HIV infection ([place]) 08/2011   Followed by [name]   Hyperlipidemia    Hypertension    Pulmonary embolism [facility])    Past Surgical History:  Procedure Laterality Date   AMPUTATION Right 07/21/2019   Procedure: RIGHT SECOND TOE AMPUTATION;  Surgeon: [name], MD;  Location: [place];  Service: Orthopedics;  Laterality: Right;   BACK SURGERY     1988   BOWEL RESECTION     CARDIAC SURGERY     CERVICAL SPINE SURGERY     " rods in my neck "   CORONARY ARTERY BYPASS GRAFT     CORONARY STENT PLACEMENT     NM MYOCAR PERF WALL MOTION  12/27/2011   normal   SPINE SURGERY     Social History:  reports that he has never smoked. He has never used smokeless tobacco. He reports that he does not currently  use drugs after having used the following drugs: "Crack" cocaine and Cocaine. Frequency: 4.00 times per week. He reports that he does not drink alcohol.  No Known Allergies  Family History  Problem Relation Age of Onset   Diabetes Mother    Hypertension Mother    Hyperlipidemia Mother    Diabetes Father    Cancer Father    Hypertension Father    Diabetes Brother    Heart disease Brother    Diabetes Sister    Colon cancer Neg Hx    Prior to Admission medications   Medication Sig Start Date End Date Taking? Authorizing Provider  Accu-Chek Softclix Lancets lancets Use as directed up to four times daily 09/06/22   Elodia Florence., MD  albuterol (VENTOLIN HFA) 108 (90 Base) MCG/ACT inhaler Inhale 1-2 puffs into the lungs every 6 (six) hours as needed for wheezing or shortness of breath. Patient not taking: Reported on 11/14/2022 10/24/22   [place] Donnamarie Poag, DO  atorvastatin (LIPITOR) 40 MG tablet Take 1 tablet (40 mg total) by mouth daily. 12/09/22 04/08/23  Parks Ranger, DO  Blood Glucose Monitoring Suppl (ACCU-CHEK GUIDE) w/Device KIT Use as directed. 09/06/22   Elodia Florence., MD  diltiazem (CARDIZEM CD) 120 MG 24 hr capsule Take 1 capsule (120 mg total) by mouth daily. 12/09/22 01/08/23  Parks Ranger, DO  DULoxetine (CYMBALTA) 20 MG capsule Take 1 capsule (20 mg total) by mouth daily. 12/10/22   Parks Ranger, DO  fluticasone furoate-vilanterol (BREO ELLIPTA) 200-25 MCG/ACT AEPB Inhale 1 puff into the lungs daily. 12/10/22   Parks Ranger, DO  glucose blood test strip use as directed up to four times daily 09/06/22   Elodia Florence., MD  Insulin Pen Needle 32G X 4 MM MISC Use as directed up to 4 times daily. 09/06/22   Elodia Florence., MD  isosorbide mononitrate (IMDUR) 30 MG 24 hr tablet Take 0.5 tablets (15 mg total) by mouth daily. 12/10/22   Parks Ranger, DO  nitroGLYCERIN (NITROSTAT) 0.4 MG SL tablet Place 1 tablet (0.4 mg  total) under the tongue every 5 (five) minutes as needed for chest pain. 12/09/22   Parks Ranger, DO  QUEtiapine (SEROQUEL) 50 MG tablet Take 1 tablet (50 mg total) by mouth at bedtime. 12/09/22   Parks Ranger, DO    Physical Exam: Vitals:   12/12/22 0500 12/12/22 0600 12/12/22 0630 12/12/22 0730  BP: (!) 153/89 (!) 145/84 (!) 151/83 (!) 179/101  Pulse: 79 76 77 74  Resp: '14 13 12 19  '$ Temp:      TempSrc:      SpO2: 100% 100% 98% 98%  Weight:      Height:       Physical Exam Vitals and nursing note reviewed.  Constitutional:  General: He is awake. He is not in acute distress.    Appearance: He is well-developed.  HENT:     Head: Normocephalic.     Nose: No rhinorrhea.     Mouth/Throat:     Mouth: Mucous membranes are moist.  Eyes:     General: No scleral icterus.    Pupils: Pupils are equal, round, and reactive to light.  Neck:     Vascular: No JVD.  Cardiovascular:     Rate and Rhythm: Normal rate and regular rhythm.     Heart sounds: S1 normal and S2 normal.  Pulmonary:     Breath sounds: No wheezing, rhonchi or rales.  Abdominal:     General: Bowel sounds are normal. There is no distension.     Palpations: Abdomen is soft.     Tenderness: There is no abdominal tenderness. There is no guarding.  Musculoskeletal:     Cervical back: Neck supple.     Right lower leg: No edema.     Left lower leg: No edema.  Skin:    General: Skin is warm and dry.  Neurological:     General: No focal deficit present.     Mental Status: He is alert and oriented to person, place, and time.  Psychiatric:        Mood and Affect: Mood normal.        Behavior: Behavior normal. Behavior is cooperative.   Data Reviewed:  Results are pending, will review when available.  09/05/2022 echocardiogram IMPRESSIONS    1. Left ventricular ejection fraction, by estimation, is 55 to 60%. The  left ventricle has normal function. The left ventricle has no regional  wall  motion abnormalities. Left ventricular diastolic parameters are  indeterminate.   2. Right ventricular systolic function is normal. The right ventricular  size is normal. Tricuspid regurgitation signal is inadequate for assessing  PA pressure.   3. No evidence of mitral valve regurgitation.   4. The aortic valve is tricuspid. Aortic valve regurgitation is not  visualized.   5. The inferior vena cava is normal in size with greater than 50%  respiratory variability, suggesting right atrial pressure of 3 mmHg.   Assessment and Plan: Principal Problem:   AKI (acute kidney injury) ([place]) With hyperkalemia Observation/telemetry. Received Lokelma in ED. Continue IV fluids. Avoid hypotension. Avoid nephrotoxins. Monitor intake and output. Monitor renal function/electrolytes.  Active Problems:   Precordial chest pain   S/P CABG (coronary artery bypass graft) Pain is pleuritic and chronic. Cardiac biomarkers not suspicious of ACS.    Cocaine use disorder [name]) Behavioral health consulted. Consult TOC team. Cessation advised.    Suicidal ideations   MDD (major depressive disorder), recurrent episode, moderate ([name]) Discontinue duloxetine. Fluoxetine 20 mg daily per [facility]. Resume quetiapine 50 mg p.o. at bedtime. Behavioral health consult appreciated.    Essential hypertension Continue Cardizem 120 mg p.o. daily.    Paroxysmal A-fib ([name]) Resume Cardizem for rate control. Not on anticoagulation or good candidate for it.    HIV (human immunodeficiency virus infection) ([place]) Currently not on therapy.    COPD (chronic obstructive pulmonary disease) ([name]) Supplemental oxygen and bronchodilators as needed.    Hyperlipidemia with target LDL less than 100 Currently not on medical therapy. Follow-up with PCP as an outpatient.    Type II diabetes mellitus with renal manifestations ([name]) Carbohydrate modified diet. CBG monitoring with RI SS.    GERD (gastroesophageal reflux  disease) H2 blocker or PPI as needed.  Anemia of chronic disease Monitor hematocrit and hemoglobin.     Advance Care Planning:   Code Status: Full Code   Consults: Behavioral health.  Family Communication:   Severity of Illness: The appropriate patient status for this patient is OBSERVATION. Observation status is judged to be reasonable and necessary in order to provide the required intensity of service to ensure the patient's safety. The patient's presenting symptoms, physical exam findings, and initial radiographic and laboratory data in the context of their medical condition is felt to place them at decreased risk for further clinical deterioration. Furthermore, it is anticipated that the patient will be medically stable for discharge from the hospital within 2 midnights of admission.   Author: Reubin Milan, MD 12/12/2022 8:19 AM  For on call review [email or web address].   This document was prepared using Dragon voice recognition software and may contain some unintended transcription errors.

## 2022-12-13 DIAGNOSIS — I480 Paroxysmal atrial fibrillation: Secondary | ICD-10-CM | POA: Diagnosis present

## 2022-12-13 DIAGNOSIS — N1832 Chronic kidney disease, stage 3b: Secondary | ICD-10-CM | POA: Diagnosis present

## 2022-12-13 DIAGNOSIS — I5022 Chronic systolic (congestive) heart failure: Secondary | ICD-10-CM | POA: Diagnosis present

## 2022-12-13 DIAGNOSIS — E875 Hyperkalemia: Secondary | ICD-10-CM | POA: Diagnosis present

## 2022-12-13 DIAGNOSIS — G8929 Other chronic pain: Secondary | ICD-10-CM | POA: Diagnosis present

## 2022-12-13 DIAGNOSIS — E872 Acidosis, unspecified: Secondary | ICD-10-CM | POA: Diagnosis present

## 2022-12-13 DIAGNOSIS — F331 Major depressive disorder, recurrent, moderate: Secondary | ICD-10-CM | POA: Diagnosis present

## 2022-12-13 DIAGNOSIS — E785 Hyperlipidemia, unspecified: Secondary | ICD-10-CM | POA: Diagnosis present

## 2022-12-13 DIAGNOSIS — M109 Gout, unspecified: Secondary | ICD-10-CM | POA: Diagnosis present

## 2022-12-13 DIAGNOSIS — K219 Gastro-esophageal reflux disease without esophagitis: Secondary | ICD-10-CM | POA: Diagnosis present

## 2022-12-13 DIAGNOSIS — E1122 Type 2 diabetes mellitus with diabetic chronic kidney disease: Secondary | ICD-10-CM | POA: Diagnosis present

## 2022-12-13 DIAGNOSIS — D638 Anemia in other chronic diseases classified elsewhere: Secondary | ICD-10-CM | POA: Diagnosis present

## 2022-12-13 DIAGNOSIS — I130 Hypertensive heart and chronic kidney disease with heart failure and stage 1 through stage 4 chronic kidney disease, or unspecified chronic kidney disease: Secondary | ICD-10-CM | POA: Diagnosis present

## 2022-12-13 DIAGNOSIS — D631 Anemia in chronic kidney disease: Secondary | ICD-10-CM | POA: Diagnosis present

## 2022-12-13 DIAGNOSIS — N179 Acute kidney failure, unspecified: Secondary | ICD-10-CM | POA: Diagnosis not present

## 2022-12-13 DIAGNOSIS — I2510 Atherosclerotic heart disease of native coronary artery without angina pectoris: Secondary | ICD-10-CM | POA: Diagnosis present

## 2022-12-13 DIAGNOSIS — I361 Nonrheumatic tricuspid (valve) insufficiency: Secondary | ICD-10-CM | POA: Diagnosis present

## 2022-12-13 DIAGNOSIS — F1414 Cocaine abuse with cocaine-induced mood disorder: Secondary | ICD-10-CM | POA: Diagnosis present

## 2022-12-13 DIAGNOSIS — Z21 Asymptomatic human immunodeficiency virus [HIV] infection status: Secondary | ICD-10-CM | POA: Diagnosis present

## 2022-12-13 DIAGNOSIS — Z794 Long term (current) use of insulin: Secondary | ICD-10-CM | POA: Diagnosis not present

## 2022-12-13 DIAGNOSIS — J4489 Other specified chronic obstructive pulmonary disease: Secondary | ICD-10-CM | POA: Diagnosis present

## 2022-12-13 DIAGNOSIS — I998 Other disorder of circulatory system: Secondary | ICD-10-CM | POA: Diagnosis present

## 2022-12-13 DIAGNOSIS — R45851 Suicidal ideations: Secondary | ICD-10-CM | POA: Diagnosis present

## 2022-12-13 DIAGNOSIS — K5900 Constipation, unspecified: Secondary | ICD-10-CM | POA: Diagnosis present

## 2022-12-13 LAB — GLUCOSE, CAPILLARY
Glucose-Capillary: 80 mg/dL (ref 70–99)
Glucose-Capillary: 130 mg/dL — ABNORMAL HIGH (ref 70–99)
Glucose-Capillary: 87 mg/dL (ref 70–99)
Glucose-Capillary: 156 mg/dL — ABNORMAL HIGH (ref 70–99)

## 2022-12-13 LAB — BASIC METABOLIC PANEL
Anion gap: 5 (ref 5–15)
BUN: 66 mg/dL — ABNORMAL HIGH (ref 8–23)
CO2: 20 mmol/L — ABNORMAL LOW (ref 22–32)
Calcium: 7.8 mg/dL — ABNORMAL LOW (ref 8.9–10.3)
Chloride: 113 mmol/L — ABNORMAL HIGH (ref 98–111)
Creatinine, Ser: 3.45 mg/dL — ABNORMAL HIGH (ref 0.61–1.24)
GFR, Estimated: 18 mL/min — ABNORMAL LOW (ref >60)
Glucose, Bld: 98 mg/dL (ref 70–99)
Potassium: 4.6 mmol/L (ref 3.5–5.1)
Sodium: 138 mmol/L (ref 135–145)

## 2022-12-13 IMAGING — DX DG CHEST 1V PORT
1 series · 1 of 1 positions shown · non-contrast
Comparison: Portable chest 10/31/2021.

CLINICAL DATA: Chest pain and shortness of breath.

EXAM:
PORTABLE CHEST 1 VIEW

[chest ap]
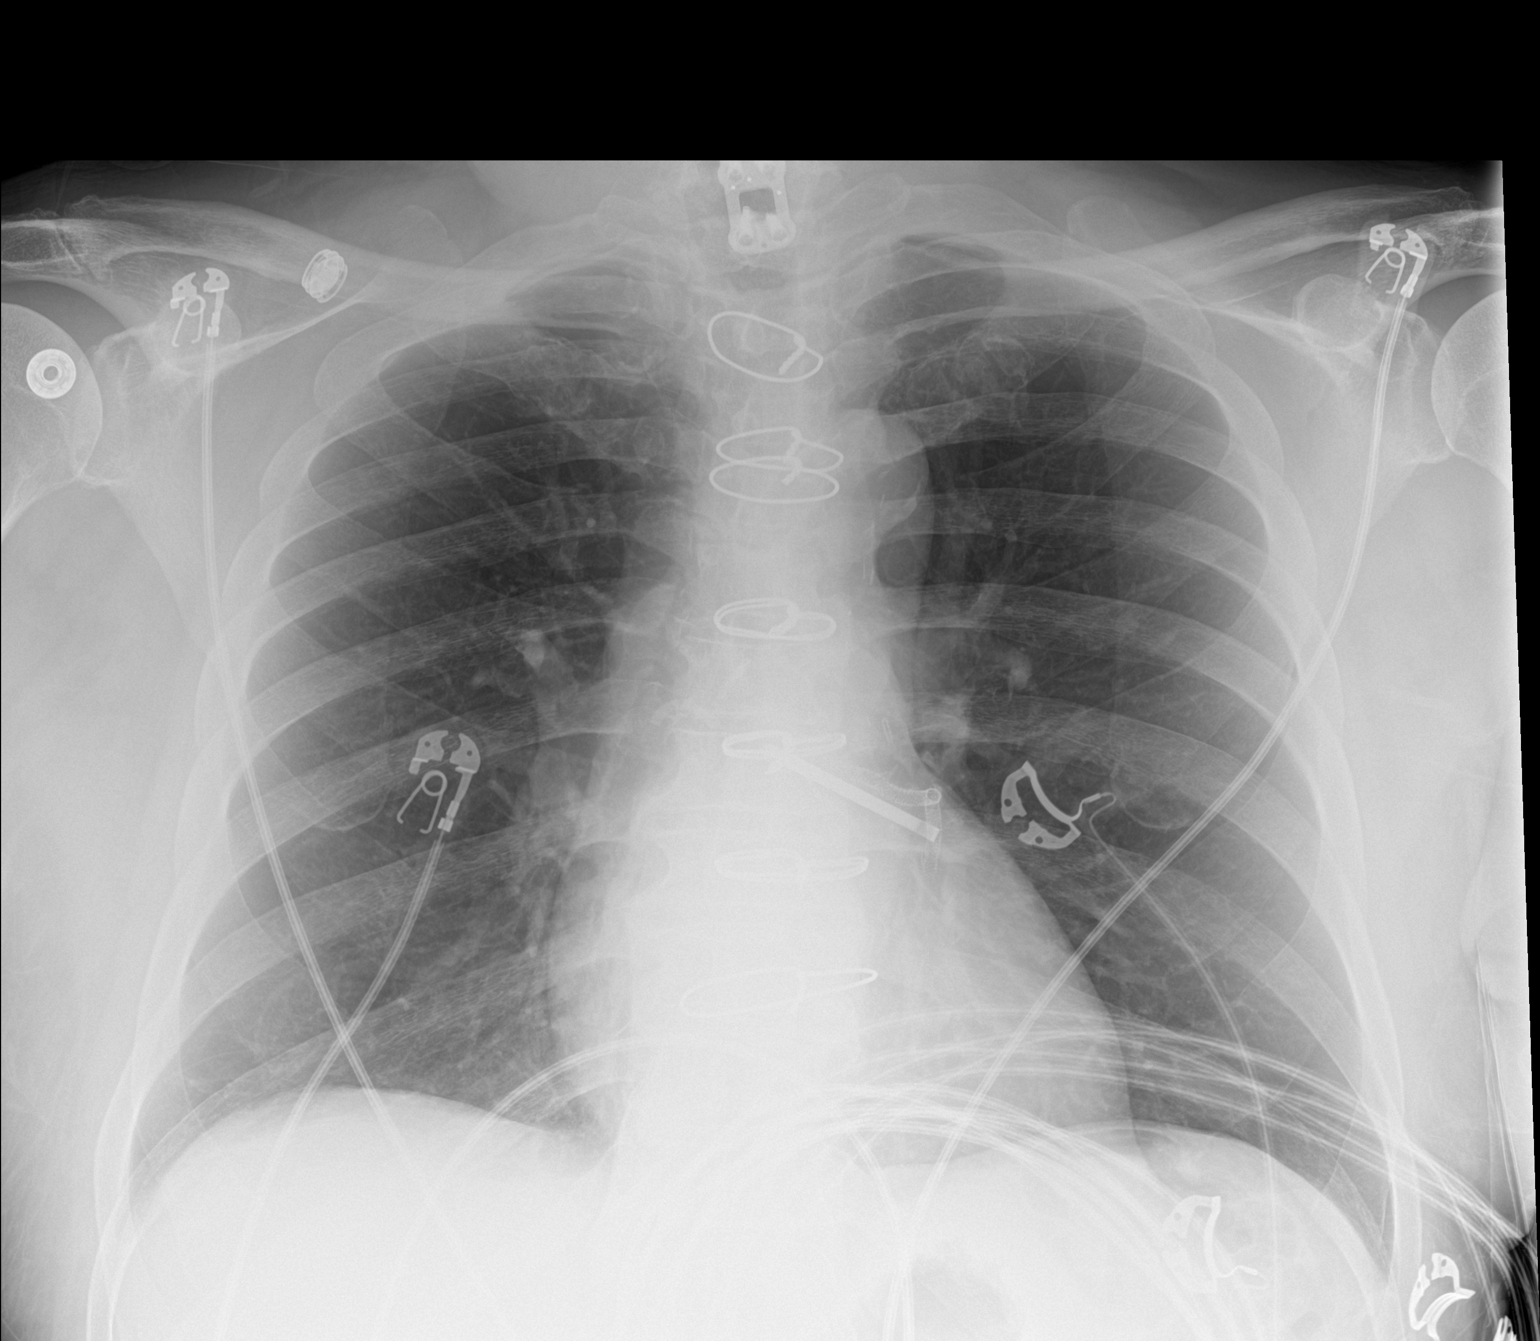

[1 of 1 positions shown; findings below may reference images not displayed]

FINDINGS: The cardiac size is normal. Sternotomy and CABG changes are again
noted with left atrial appendage clip and LAD coronary artery stent.

The lungs are clear. No pleural effusion is seen. There is overlying
monitor wiring and partially visible multilevel cervical spine ACDF
plating. Aortic atherosclerosis with stable mediastinum. Thoracic
spondylosis.
IMPRESSION: No active disease.  Stable chest.

## 2022-12-13 MED ORDER — PANTOPRAZOLE SODIUM 40 MG PO TBEC
40.0000 mg | DELAYED_RELEASE_TABLET | Freq: Every day | ORAL | Status: DC
  Administered 2022-12-13 – 2022-12-23 (×11): 40 mg via ORAL
  Filled 2022-12-13 (×11): qty 1

## 2022-12-13 MED ORDER — LACTATED RINGERS IV SOLN: INTRAVENOUS | Status: DC

## 2022-12-13 MED ORDER — SODIUM BICARBONATE 650 MG PO TABS
650.0000 mg | ORAL_TABLET | Freq: Three times a day (TID) | ORAL | Status: DC
  Administered 2022-12-13 – 2022-12-23 (×31): 650 mg via ORAL
  Filled 2022-12-13 (×31): qty 1

## 2022-12-13 MED ORDER — ATORVASTATIN CALCIUM 40 MG PO TABS
40.0000 mg | ORAL_TABLET | Freq: Every day | ORAL | Status: DC
  Administered 2022-12-13 – 2022-12-23 (×11): 40 mg via ORAL
  Filled 2022-12-13 (×11): qty 1

## 2022-12-13 MED ORDER — HEPARIN SODIUM (PORCINE) 5000 UNIT/ML IJ SOLN
5000.0000 [IU] | Freq: Three times a day (TID) | INTRAMUSCULAR | Status: DC
  Administered 2022-12-13 – 2022-12-23 (×27): 5000 [IU] via SUBCUTANEOUS
  Filled 2022-12-13 (×28): qty 1

## 2022-12-13 NOTE — Progress Notes (Signed)
Mobility Specialist - Progress Note   12/13/22 1555  Mobility  Activity Ambulated with assistance in hallway;Ambulated with assistance to bathroom  Level of Assistance Standby assist, set-up cues, supervision of patient - no hands on  Assistive Device Front wheel walker  Distance Ambulated (ft) 160 ft  Activity Response Tolerated well  Mobility Referral Yes  $Mobility charge 1 Mobility   Pt received in bed and agreeable to mobility. Assisted w/ changing pts gown due to pt being found soaked in urine. No complaints during session. Upon returning to room pt requested assistance to bathroom. Pt to bed after session with all needs met & sitter in room.   [facility]

## 2022-12-13 NOTE — Progress Notes (Signed)
PROGRESS NOTE    Michael [name]  [id number] DOB: Mar 18, 1951 DOA: 12/11/2022 PCP: [name], MD   Brief Narrative: 72 year old with past medical history significant for paroxysmal A-fib, anemia, asthma, CAD, medial femoral condyle chondromalacia, stage IV CKD, collagen vascular disease, history of drug abuse (cocaine over more than 20 years), depression, diabetes type 2, gout, history of HIV, hypertension and PE, recent complaining of worsening chronic chest pain and suicidal ideation.  Patient also found to have AKI on CKD.  Evaluated by psych who recommended a routine level of observation.   Assessment & Plan:   Principal Problem:   AKI (acute kidney injury) ([place]) Active Problems:   Cocaine use disorder ([place])   Essential hypertension   Paroxysmal A-fib ([name])   HIV (human immunodeficiency virus infection) ([place])   Suicidal ideations   COPD (chronic obstructive pulmonary disease) ([name])   Hyperlipidemia with target LDL less than 100   Precordial chest pain   Type II diabetes mellitus with renal manifestations ([name])   GERD (gastroesophageal reflux disease)   S/P CABG (coronary artery bypass graft)   MDD (major depressive disorder), recurrent episode, moderate ([name])   Anemia of chronic disease   Hyperkalemia   1-AKI on CKD stage IV: -Metabolic  acidosis, hyperkalemia: -Continue with IV fluids -Strict I's and O's -Received Lokelma, potassium decreased to 4.9 -Repeat B-met today.  -start sodium bicarb,.     Precordial chest pain Status post CABG Reports worsening of his chronic chest pain, he has had chest pain since after he had CABG.  Pain is worse on palpation.  Likely musculoskeletal pain. Also chest pain in the setting of cocaine use Troponin not significantly elevated. Continue with Imdur, tylenol  ECHO; ejection fraction 50%, no significant changes since prior 2D echo MR angio chest: No convincing evidence of aortic dissection or other acute aortic  abnormality. Concentric hypertrophy of the left ventricular myocardium consistent with the clinical history of hypertension.  Cocaine use disorder: Counseling provided  Suicidal ideation, MDD: Continue fluoxetine and Seroquel Psych recommend continue observation Cymbalta discontinue by Psych. Patient wasn't clear when this medication was started.   Hypertension: Continue with Cardizem  A-fib: continue with Cardizem But not to be a good candidate for anticoagulation.  HIV: Not currently on therapy Will need referral to ID>   PAD: Continue with bronchodilators as needed  Lipidemia: resume Lipitor.   Diabetes type 2 with renal manifestation SSI  Anemia of chronic disease: Monitor hb.   GERD: Continue with PPI     Estimated body mass index is 22.93 kg/m as calculated from the following:   Height as of this encounter: '5\' 8"'$  (1.727 m).   Weight as of this encounter: 68.4 kg.   DVT prophylaxis: Heparin Code Status: Full code Family Communication: Discussed with patient Disposition Plan:  Status is: Inpatient Remains inpatient appropriate because: Management of AKI and chronic chest pain suicidal ideation    Consultants:  Psych   Procedures:  ECHO  Antimicrobials:    Subjective: He is sleepy, still complaining of chest pain, chronic pain for him, worse with palpation.  He has had this pain since he has open heart surgery.  Objective: Vitals:   12/12/22 1934 12/12/22 2338 12/13/22 0330 12/13/22 1335  BP: 130/75 136/74 123/74 123/73  Pulse: 90 78 74 70  Resp: '17 20 20 18  '$ Temp: 98.9 F (37.2 C) 98.5 F (36.9 C) 98.1 F (36.7 C) 97.9 F (36.6 C)  TempSrc: Oral Oral Oral Oral  SpO2: 100% 100%  100% 100%  Weight:   68.4 kg   Height:        Intake/Output Summary (Last 24 hours) at 12/13/2022 1340 Last data filed at 12/13/2022 1105 Gross per 24 hour  Intake 1571.25 ml  Output 800 ml  Net 771.25 ml   Filed Weights   12/12/22 0010 12/13/22 0330  Weight:  70.8 kg 68.4 kg    Examination:  General exam: Appears calm and comfortable  Respiratory system: Clear to auscultation. Respiratory effort normal. Cardiovascular system: S1 & S2 heard, RRR. No JVD, murmurs, rubs, gallops or clicks. No pedal edema. Gastrointestinal system: Abdomen is nondistended, soft and nontender. No organomegaly or masses felt. Normal bowel sounds heard. Central nervous system: Alert and oriented. Extremities: Symmetric 5 x 5 power.   Data Reviewed: I have personally reviewed following labs and imaging studies  CBC: Recent Labs  Lab 12/12/22 0019 12/12/22 0744  WBC 5.4 4.7  NEUTROABS 4.3 3.2  HGB 10.5* 9.0*  HCT 33.8* 29.8*  MCV 91.6 92.8  PLT 163 [id number]*   Basic Metabolic Panel: Recent Labs  Lab 12/12/22 0019 12/12/22 0744  NA 139 139  K 5.6* 4.9  CL 110 114*  CO2 16* 16*  GLUCOSE 129* 127*  BUN 88* 85*  CREATININE 4.84* 4.26*  CALCIUM 9.0 8.5*  MG  --  2.3   GFR: Estimated Creatinine Clearance: 15.2 mL/min (A) (by C-G formula based on SCr of 4.26 mg/dL (H)). Liver Function Tests: Recent Labs  Lab 12/12/22 0019 12/12/22 0744  AST 28 24  ALT 31 26  ALKPHOS 81 82  BILITOT 0.8 0.8  PROT 8.4* 7.3  ALBUMIN 5.0 4.3   Recent Labs  Lab 12/12/22 0019  LIPASE 53*   No results for input(s): "AMMONIA" in the last 168 hours. Coagulation Profile: No results for input(s): "INR", "PROTIME" in the last 168 hours. Cardiac Enzymes: No results for input(s): "CKTOTAL", "CKMB", "CKMBINDEX", "TROPONINI" in the last 168 hours. BNP (last 3 results) No results for input(s): "PROBNP" in the last 8760 hours. HbA1C: No results for input(s): "HGBA1C" in the last 72 hours. CBG: Recent Labs  Lab 12/12/22 0805 12/12/22 1221 12/12/22 1713 12/13/22 0743 12/13/22 1136  GLUCAP 139* 114* 143* 80 130*   Lipid Profile: No results for input(s): "CHOL", "HDL", "LDLCALC", "TRIG", "CHOLHDL", "LDLDIRECT" in the last 72 hours. Thyroid Function Tests: No results  for input(s): "TSH", "T4TOTAL", "FREET4", "T3FREE", "THYROIDAB" in the last 72 hours. Anemia Panel: No results for input(s): "VITAMINB12", "FOLATE", "FERRITIN", "TIBC", "IRON", "RETICCTPCT" in the last 72 hours. Sepsis Labs: No results for input(s): "PROCALCITON", "LATICACIDVEN" in the last 168 hours.  No results found for this or any previous visit (from the past 240 hour(s)).       Radiology Studies: ECHOCARDIOGRAM COMPLETE  Result Date: 12/12/2022    ECHOCARDIOGRAM REPORT   Patient Name:   TAYSIR CRUMMIE Date of Exam: 12/12/2022 Medical Rec #:  [id number]           Height:       68.0 in Accession #:    [id number]          Weight:       156.0 lb Date of Birth:  07/08/1951           BSA:          1.839 m Patient Age:    43 years            BP:           179/101  mmHg Patient Gender: M                   HR:           81 bpm. Exam Location:  Inpatient Procedure: 2D Echo Indications:    chest pain  History:        Patient has prior history of Echocardiogram examinations, most                 recent 09/05/2022. Cardiomyopathy, CAD, COPD; Risk                 Factors:Diabetes, Hypertension and Dyslipidemia.  Sonographer:    Harvie Junior Referring Phys: [id number] Rhetta Mura  Sonographer Comments: Suboptimal subcostal window. Image acquisition challenging due to COPD. IMPRESSIONS  1. Left ventricular ejection fraction, by estimation, is 55 to 60%. Left ventricular ejection fraction by 3D volume is 57 %. The left ventricle has normal function. The left ventricle demonstrates regional wall motion abnormalities (see scoring diagram/findings for description). Left ventricular diastolic parameters were normal. There is mild hypokinesis of the left ventricular, basal-mid inferoseptal wall and inferior wall.  2. Right ventricular systolic function is normal. The right ventricular size is normal. There is normal pulmonary artery systolic pressure.  3. The mitral valve is normal in structure. No evidence of  mitral valve regurgitation. No evidence of mitral stenosis.  4. The aortic valve is tricuspid. Aortic valve regurgitation is not visualized. No aortic stenosis is present.  5. The inferior vena cava is normal in size with greater than 50% respiratory variability, suggesting right atrial pressure of 3 mmHg. Comparison(s): No significant change from prior study. Prior images reviewed side by side. The left ventricular function is unchanged. The left ventricular wall motion abnormality is unchanged. On review, the inferior/inferoseptal wall motion abnormality  was the same on the 09/05/2022 study. FINDINGS  Left Ventricle: Left ventricular ejection fraction, by estimation, is 55 to 60%. Left ventricular ejection fraction by 3D volume is 57 %. The left ventricle has normal function. The left ventricle demonstrates regional wall motion abnormalities. Mild hypokinesis of the left ventricular, basal-mid inferoseptal wall and inferior wall. The left ventricular internal cavity size was normal in size. There is no left ventricular hypertrophy. Abnormal (paradoxical) septal motion consistent with post-operative status. Left ventricular diastolic parameters were normal. Normal left ventricular filling pressure.  LV Wall Scoring: The inferior wall, mid inferoseptal segment, and basal inferoseptal segment are hypokinetic. Right Ventricle: The right ventricular size is normal. No increase in right ventricular wall thickness. Right ventricular systolic function is normal. There is normal pulmonary artery systolic pressure. The tricuspid regurgitant velocity is 2.63 m/s, and  with an assumed right atrial pressure of 3 mmHg, the estimated right ventricular systolic pressure is 99991111 mmHg. Left Atrium: Left atrial size was normal in size. Right Atrium: Right atrial size was normal in size. Pericardium: There is no evidence of pericardial effusion. Mitral Valve: The mitral valve is normal in structure. No evidence of mitral valve  regurgitation. No evidence of mitral valve stenosis. Tricuspid Valve: The tricuspid valve is normal in structure. Tricuspid valve regurgitation is trivial. No evidence of tricuspid stenosis. Aortic Valve: The aortic valve is tricuspid. Aortic valve regurgitation is not visualized. No aortic stenosis is present. Aortic valve mean gradient measures 4.0 mmHg. Aortic valve peak gradient measures 8.5 mmHg. Aortic valve area, by VTI measures 2.39 cm. Pulmonic Valve: The pulmonic valve was normal in structure. Pulmonic valve regurgitation is not visualized. No evidence of  pulmonic stenosis. Aorta: The aortic root is normal in size and structure. Venous: The inferior vena cava is normal in size with greater than 50% respiratory variability, suggesting right atrial pressure of 3 mmHg. IAS/Shunts: No atrial level shunt detected by color flow Doppler.  LEFT VENTRICLE PLAX 2D LVIDd:         4.80 cm         Diastology LVIDs:         3.90 cm         LV e' medial:    6.96 cm/s LV PW:         1.05 cm         LV E/e' medial:  10.9 LV IVS:        1.05 cm         LV e' lateral:   8.70 cm/s LVOT diam:     2.00 cm         LV E/e' lateral: 8.7 LV SV:         64 LV SV Index:   35 LVOT Area:     3.14 cm        3D Volume EF                                LV 3D EF:    Left                                             ventricul LV Volumes (MOD)                            ar LV vol d, MOD    72.0 ml                    ejection A2C:                                        fraction LV vol d, MOD    82.5 ml                    by 3D A4C:                                        volume is LV vol s, MOD    26.7 ml                    57 %. A2C: LV vol s, MOD    36.5 ml A4C:                           3D Volume EF: LV SV MOD A2C:   45.3 ml       3D EF:        57 % LV SV MOD A4C:   82.5 ml       LV EDV:       106 ml LV SV MOD BP:    45.0 ml       LV ESV:  46 ml                                LV SV:        60 ml RIGHT VENTRICLE RV Basal diam:  3.70 cm  RV Mid diam:    2.70 cm RV S prime:     9.36 cm/s TAPSE (M-mode): 1.6 cm LEFT ATRIUM             Index        RIGHT ATRIUM           Index LA diam:        3.40 cm 1.85 cm/m   RA Area:     13.20 cm LA Vol (A2C):   36.1 ml 19.63 ml/m  RA Volume:   33.00 ml  17.95 ml/m LA Vol (A4C):   43.5 ml 23.66 ml/m LA Biplane Vol: 38.7 ml 21.04 ml/m  AORTIC VALVE                    PULMONIC VALVE AV Area (Vmax):    2.30 cm     PV Vmax:       1.07 m/s AV Area (Vmean):   2.48 cm     PV Peak grad:  4.6 mmHg AV Area (VTI):     2.39 cm AV Vmax:           146.00 cm/s AV Vmean:          88.900 cm/s AV VTI:            0.268 m AV Peak Grad:      8.5 mmHg AV Mean Grad:      4.0 mmHg LVOT Vmax:         107.00 cm/s LVOT Vmean:        70.200 cm/s LVOT VTI:          0.204 m LVOT/AV VTI ratio: 0.76  AORTA Ao Root diam: 3.30 cm MITRAL VALVE               TRICUSPID VALVE MV Area (PHT): 4.39 cm    TR Peak grad:   27.7 mmHg MV Decel Time: 173 msec    TR Vmax:        263.00 cm/s MR Peak grad: 46.8 mmHg MR Vmax:      342.00 cm/s  SHUNTS MV E velocity: 76.00 cm/s  Systemic VTI:  0.20 m MV A velocity: 60.60 cm/s  Systemic Diam: 2.00 cm MV E/A ratio:  1.25 Mihai Croitoru MD Electronically signed by Sanda Klein MD Signature Date/Time: 12/12/2022/4:20:27 PM    Final    MR ANGIO CHEST WO CONTRAST  Result Date: 12/12/2022 CLINICAL DATA:  Acute aortic syndrome suspected EXAM: MRA CHEST WITH OR WITHOUT CONTRAST TECHNIQUE: Angiographic images of the chest were obtained using MRA technique without intravenous contrast. CONTRAST:  None. COMPARISON:  None Available. FINDINGS: VASCULAR Aorta: 2 vessel aortic arch. The right brachiocephalic and left common carotid artery share a common origin. No evidence of aneurysm or dissection. Evaluation is slightly limited by both margin artifact and susceptibility artifact from prior median sternotomy and cerclage wires. Heart: The heart is normal in size. Mild concentric hypertrophy of the left ventricular  myocardium. No pericardial effusion. Pulmonary Arteries:  The main pulmonary artery is normal in caliber. Other: None. NON-VASCULAR No significant focal signal abnormality within the chest or visualized upper abdomen. IMPRESSION: 1. No convincing evidence of  aortic dissection or other acute aortic abnormality. 2. Concentric hypertrophy of the left ventricular myocardium consistent with the clinical history of hypertension. 3. Surgical changes of prior median sternotomy. Electronically Signed   By: Jacqulynn Cadet M.D.   On: 12/12/2022 13:24   DG Chest 2 View  Result Date: 12/12/2022 CLINICAL DATA:  Suicidal ideation. EXAM: CHEST - 2 VIEW COMPARISON:  AP Lat 11/26/2022 FINDINGS: There are CABG changes, coronary artery stents in left atrial appendage closure device. The mediastinum is normally outlined. There is aortic atherosclerosis. The cardiac size is normal The lungs are clear. There is a partially visible lower cervical ACDF plating. Osteopenia and thoracic spondylosis. Comparison to the prior study reveals no significant interval change. IMPRESSION: No active cardiopulmonary disease. CABG changes. Aortic atherosclerosis. Electronically Signed   By: Telford Nab M.D.   On: 12/12/2022 00:39        Scheduled Meds:  diltiazem  120 mg Oral Daily   FLUoxetine  20 mg Oral Daily   insulin aspart  0-9 Units Subcutaneous TID WC   isosorbide mononitrate  15 mg Oral Daily   QUEtiapine  50 mg Oral QHS   sodium bicarbonate  650 mg Oral TID   Continuous Infusions:  lactated ringers 100 mL/hr at 12/13/22 1038     LOS: 0 days    Time spent: 35 minutes    Deshan Hemmelgarn A Sheridan Hew, MD [facility]   If 7PM-7AM, please contact night-coverage [email or web address]  12/13/2022, 1:40 PM

## 2022-12-14 ENCOUNTER — Inpatient Hospital Stay (HOSPITAL_COMMUNITY): Payer: 59

## 2022-12-14 DIAGNOSIS — N179 Acute kidney failure, unspecified: Secondary | ICD-10-CM | POA: Diagnosis not present

## 2022-12-14 LAB — BASIC METABOLIC PANEL
Anion gap: 6 (ref 5–15)
BUN: 61 mg/dL — ABNORMAL HIGH (ref 8–23)
CO2: 20 mmol/L — ABNORMAL LOW (ref 22–32)
Calcium: 7.5 mg/dL — ABNORMAL LOW (ref 8.9–10.3)
Chloride: 111 mmol/L (ref 98–111)
Creatinine, Ser: 3.5 mg/dL — ABNORMAL HIGH (ref 0.61–1.24)
GFR, Estimated: 18 mL/min — ABNORMAL LOW (ref >60)
Glucose, Bld: 91 mg/dL (ref 70–99)
Potassium: 4.6 mmol/L (ref 3.5–5.1)
Sodium: 137 mmol/L (ref 135–145)

## 2022-12-14 LAB — CBC
HCT: 23.7 % — ABNORMAL LOW (ref 39.0–52.0)
Hemoglobin: 7.6 g/dL — ABNORMAL LOW (ref 13.0–17.0)
MCH: 28.8 pg (ref 26.0–34.0)
MCHC: 32.1 g/dL (ref 30.0–36.0)
MCV: 89.8 fL (ref 80.0–100.0)
Platelets: 114 10*3/uL — ABNORMAL LOW (ref 150–400)
RBC: 2.64 MIL/uL — ABNORMAL LOW (ref 4.22–5.81)
RDW: 13.2 % (ref 11.5–15.5)
WBC: 2.9 10*3/uL — ABNORMAL LOW (ref 4.0–10.5)
nRBC: 0 % (ref 0.0–0.2)

## 2022-12-14 LAB — GLUCOSE, CAPILLARY
Glucose-Capillary: 121 mg/dL — ABNORMAL HIGH (ref 70–99)
Glucose-Capillary: 92 mg/dL (ref 70–99)
Glucose-Capillary: 114 mg/dL — ABNORMAL HIGH (ref 70–99)
Glucose-Capillary: 129 mg/dL — ABNORMAL HIGH (ref 70–99)

## 2022-12-14 MED ORDER — FOLIC ACID 1 MG PO TABS
1.0000 mg | ORAL_TABLET | Freq: Every day | ORAL | Status: DC
  Administered 2022-12-14 – 2022-12-23 (×10): 1 mg via ORAL
  Filled 2022-12-14 (×10): qty 1

## 2022-12-14 MED ORDER — SORBITOL 70 % SOLN: 30.0000 mL | Freq: Every day | Status: DC | PRN

## 2022-12-14 MED ORDER — FERROUS SULFATE 325 (65 FE) MG PO TABS
325.0000 mg | ORAL_TABLET | Freq: Every day | ORAL | Status: DC
  Administered 2022-12-15 – 2022-12-23 (×9): 325 mg via ORAL
  Filled 2022-12-14 (×10): qty 1

## 2022-12-14 MED ORDER — MINERAL OIL RE ENEM
1.0000 | ENEMA | Freq: Once | RECTAL | Status: AC
  Administered 2022-12-14: 1 via RECTAL
  Filled 2022-12-14: qty 1

## 2022-12-14 NOTE — Progress Notes (Signed)
PROGRESS NOTE    Michael [name]  [id number] DOB: 1951/03/09 DOA: 12/11/2022 PCP: [name], MD   Brief Narrative: 72 year old with past medical history significant for paroxysmal A-fib, anemia, asthma, CAD, medial femoral condyle chondromalacia, stage IV CKD, collagen vascular disease, history of drug abuse (cocaine over more than 20 years), depression, diabetes type 2, gout, history of HIV, hypertension and PE, recent complaining of worsening chronic chest pain and suicidal ideation.  Patient also found to have AKI on CKD.  Evaluated by psych who recommended a routine level of observation.   Assessment & Plan:   Principal Problem:   AKI (acute kidney injury) ([place]) Active Problems:   Cocaine use disorder ([place])   Essential hypertension   Paroxysmal A-fib ([name])   HIV (human immunodeficiency virus infection) ([name])   Suicidal ideations   COPD (chronic obstructive pulmonary disease) ([name])   Hyperlipidemia with target LDL less than 100   Precordial chest pain   Type II diabetes mellitus with renal manifestations ([name])   GERD (gastroesophageal reflux disease)   S/P CABG (coronary artery bypass graft)   MDD (major depressive disorder), recurrent episode, moderate ([name])   Anemia of chronic disease   Hyperkalemia   1-AKI on CKD stage IV: -Metabolic  acidosis, hyperkalemia: -Continue with IV fluids -Strict I's and O's -Received Lokelma, potassium decreased to 4.9 -started  sodium bicarb,.  Renal function improved.    Precordial chest pain Status post CABG Reports worsening of his chronic chest pain, he has had chest pain since after he had CABG.  Pain is worse on palpation.  Likely musculoskeletal pain. Also chest pain in the setting of cocaine use Troponin not significantly elevated. Continue with Imdur, tylenol  ECHO; ejection fraction 50%, no significant changes since prior 2D echo MR angio chest: No convincing evidence of aortic dissection or other acute aortic  abnormality. Concentric hypertrophy of the left ventricular myocardium consistent with the clinical history of hypertension.  Cocaine use disorder: Counseling provided  Suicidal ideation, MDD: Continue fluoxetine and Seroquel Psych recommend continue observation Cymbalta discontinue by Psych. Patient wasn't clear when this medication was started.   Hypertension: Continue with Cardizem  A-fib: continue with Cardizem But not to be a good candidate for anticoagulation.  HIV: Not currently on therapy Will need referral to ID>   PAD: Continue with bronchodilators as needed  Lipidemia: resume Lipitor.   Diabetes type 2 with renal manifestation SSI  Anemia of chronic disease: Monitor hb.   GERD: Continue with PPI  Abdominal pain, lower quadrant, likely related to constipation; KUB; significant stool burden.  Mineral oil enema order. Sorbitol.     Estimated body mass index is 23.46 kg/m as calculated from the following:   Height as of this encounter: '5\' 8"'$  (1.727 m).   Weight as of this encounter: 70 kg.   DVT prophylaxis: Heparin Code Status: Full code Family Communication: Discussed with patient Disposition Plan:  Status is: Inpatient Remains inpatient appropriate because: Management of AKI and chronic chest pain suicidal ideation    Consultants:  Psych   Procedures:  ECHO  Antimicrobials:    Subjective: He is alert, complaints of lower quadrant pain   Objective: Vitals:   12/13/22 2053 12/14/22 0500 12/14/22 0603 12/14/22 1233  BP: (!) 154/81  (!) 140/81 (!) 147/76  Pulse: 71  63 66  Resp: '20  16 14  '$ Temp: 98.3 F (36.8 C)  98.4 F (36.9 C) 98.3 F (36.8 C)  TempSrc: Oral  Oral Oral  SpO2: 100%  99% 100%  Weight:  70 kg    Height:        Intake/Output Summary (Last 24 hours) at 12/14/2022 1250 Last data filed at 12/14/2022 1248 Gross per 24 hour  Intake 2686.11 ml  Output 1200 ml  Net 1486.11 ml    Filed Weights   12/12/22 0010 12/13/22  0330 12/14/22 0500  Weight: 70.8 kg 68.4 kg 70 kg    Examination:  General exam: NAD Respiratory system: CTA Cardiovascular system: S 1, S 2 RRR Gastrointestinal system: BS present, soft, nt Central nervous system: alert Extremities: symmetric power.    Data Reviewed: I have personally reviewed following labs and imaging studies  CBC: Recent Labs  Lab 12/12/22 0019 12/12/22 0744 12/14/22 0423  WBC 5.4 4.7 2.9*  NEUTROABS 4.3 3.2  --   HGB 10.5* 9.0* 7.6*  HCT 33.8* 29.8* 23.7*  MCV 91.6 92.8 89.8  PLT 163 142* 114*    Basic Metabolic Panel: Recent Labs  Lab 12/12/22 0019 12/12/22 0744 12/13/22 1434 12/14/22 0423  NA 139 139 138 137  K 5.6* 4.9 4.6 4.6  CL 110 114* 113* 111  CO2 16* 16* 20* 20*  GLUCOSE 129* 127* 98 91  BUN 88* 85* 66* 61*  CREATININE 4.84* 4.26* 3.45* 3.50*  CALCIUM 9.0 8.5* 7.8* 7.5*  MG  --  2.3  --   --     GFR: Estimated Creatinine Clearance: 18.5 mL/min (A) (by C-G formula based on SCr of 3.5 mg/dL (H)). Liver Function Tests: Recent Labs  Lab 12/12/22 0019 12/12/22 0744  AST 28 24  ALT 31 26  ALKPHOS 81 82  BILITOT 0.8 0.8  PROT 8.4* 7.3  ALBUMIN 5.0 4.3    Recent Labs  Lab 12/12/22 0019  LIPASE 53*    No results for input(s): "AMMONIA" in the last 168 hours. Coagulation Profile: No results for input(s): "INR", "PROTIME" in the last 168 hours. Cardiac Enzymes: No results for input(s): "CKTOTAL", "CKMB", "CKMBINDEX", "TROPONINI" in the last 168 hours. BNP (last 3 results) No results for input(s): "PROBNP" in the last 8760 hours. HbA1C: No results for input(s): "HGBA1C" in the last 72 hours. CBG: Recent Labs  Lab 12/13/22 1136 12/13/22 1603 12/13/22 2050 12/14/22 0808 12/14/22 1159  GLUCAP 130* 87 156* 121* 92    Lipid Profile: No results for input(s): "CHOL", "HDL", "LDLCALC", "TRIG", "CHOLHDL", "LDLDIRECT" in the last 72 hours. Thyroid Function Tests: No results for input(s): "TSH", "T4TOTAL", "FREET4",  "T3FREE", "THYROIDAB" in the last 72 hours. Anemia Panel: No results for input(s): "VITAMINB12", "FOLATE", "FERRITIN", "TIBC", "IRON", "RETICCTPCT" in the last 72 hours. Sepsis Labs: No results for input(s): "PROCALCITON", "LATICACIDVEN" in the last 168 hours.  No results found for this or any previous visit (from the past 240 hour(s)).       Radiology Studies: DG Abd 1 View  Result Date: 12/14/2022 CLINICAL DATA:  Generalized abdominal pain. EXAM: ABDOMEN - 1 VIEW COMPARISON:  10/12/2022 FINDINGS: Bowel gas pattern is nonobstructive. There is significant stool burden throughout nondilated loops of colon. No evidence for organomegaly or free intraperitoneal air. There chronic degenerative changes in the LEFT hip. Remote lumbosacral fusion. IMPRESSION: 1. Nonobstructive bowel gas pattern. 2. Significant stool burden. Electronically Signed   By: Nolon Nations M.D.   On: 12/14/2022 11:40   ECHOCARDIOGRAM COMPLETE  Result Date: 12/12/2022    ECHOCARDIOGRAM REPORT   Patient Name:   PUJAN FREELY Date of Exam: 12/12/2022 Medical Rec #:  [id number]  Height:       68.0 in Accession #:    [id number]          Weight:       156.0 lb Date of Birth:  [birth year]           BSA:          1.839 m Patient Age:    57 years            BP:           179/101 mmHg Patient Gender: M                   HR:           81 bpm. Exam Location:  Inpatient Procedure: 2D Echo Indications:    chest pain  History:        Patient has prior history of Echocardiogram examinations, most                 recent 09/05/2022. Cardiomyopathy, CAD, COPD; Risk                 Factors:Diabetes, Hypertension and Dyslipidemia.  Sonographer:    Harvie Junior Referring Phys: [id number] Rhetta Mura  Sonographer Comments: Suboptimal subcostal window. Image acquisition challenging due to COPD. IMPRESSIONS  1. Left ventricular ejection fraction, by estimation, is 55 to 60%. Left ventricular ejection fraction by 3D volume is 57 %. The left  ventricle has normal function. The left ventricle demonstrates regional wall motion abnormalities (see scoring diagram/findings for description). Left ventricular diastolic parameters were normal. There is mild hypokinesis of the left ventricular, basal-mid inferoseptal wall and inferior wall.  2. Right ventricular systolic function is normal. The right ventricular size is normal. There is normal pulmonary artery systolic pressure.  3. The mitral valve is normal in structure. No evidence of mitral valve regurgitation. No evidence of mitral stenosis.  4. The aortic valve is tricuspid. Aortic valve regurgitation is not visualized. No aortic stenosis is present.  5. The inferior vena cava is normal in size with greater than 50% respiratory variability, suggesting right atrial pressure of 3 mmHg. Comparison(s): No significant change from prior study. Prior images reviewed side by side. The left ventricular function is unchanged. The left ventricular wall motion abnormality is unchanged. On review, the inferior/inferoseptal wall motion abnormality  was the same on the 09/05/2022 study. FINDINGS  Left Ventricle: Left ventricular ejection fraction, by estimation, is 55 to 60%. Left ventricular ejection fraction by 3D volume is 57 %. The left ventricle has normal function. The left ventricle demonstrates regional wall motion abnormalities. Mild hypokinesis of the left ventricular, basal-mid inferoseptal wall and inferior wall. The left ventricular internal cavity size was normal in size. There is no left ventricular hypertrophy. Abnormal (paradoxical) septal motion consistent with post-operative status. Left ventricular diastolic parameters were normal. Normal left ventricular filling pressure.  LV Wall Scoring: The inferior wall, mid inferoseptal segment, and basal inferoseptal segment are hypokinetic. Right Ventricle: The right ventricular size is normal. No increase in right ventricular wall thickness. Right ventricular  systolic function is normal. There is normal pulmonary artery systolic pressure. The tricuspid regurgitant velocity is 2.63 m/s, and  with an assumed right atrial pressure of 3 mmHg, the estimated right ventricular systolic pressure is 99991111 mmHg. Left Atrium: Left atrial size was normal in size. Right Atrium: Right atrial size was normal in size. Pericardium: There is no evidence of pericardial effusion. Mitral Valve: The mitral valve is normal in  structure. No evidence of mitral valve regurgitation. No evidence of mitral valve stenosis. Tricuspid Valve: The tricuspid valve is normal in structure. Tricuspid valve regurgitation is trivial. No evidence of tricuspid stenosis. Aortic Valve: The aortic valve is tricuspid. Aortic valve regurgitation is not visualized. No aortic stenosis is present. Aortic valve mean gradient measures 4.0 mmHg. Aortic valve peak gradient measures 8.5 mmHg. Aortic valve area, by VTI measures 2.39 cm. Pulmonic Valve: The pulmonic valve was normal in structure. Pulmonic valve regurgitation is not visualized. No evidence of pulmonic stenosis. Aorta: The aortic root is normal in size and structure. Venous: The inferior vena cava is normal in size with greater than 50% respiratory variability, suggesting right atrial pressure of 3 mmHg. IAS/Shunts: No atrial level shunt detected by color flow Doppler.  LEFT VENTRICLE PLAX 2D LVIDd:         4.80 cm         Diastology LVIDs:         3.90 cm         LV e' medial:    6.96 cm/s LV PW:         1.05 cm         LV E/e' medial:  10.9 LV IVS:        1.05 cm         LV e' lateral:   8.70 cm/s LVOT diam:     2.00 cm         LV E/e' lateral: 8.7 LV SV:         64 LV SV Index:   35 LVOT Area:     3.14 cm        3D Volume EF                                LV 3D EF:    Left                                             ventricul LV Volumes (MOD)                            ar LV vol d, MOD    72.0 ml                    ejection A2C:                                         fraction LV vol d, MOD    82.5 ml                    by 3D A4C:                                        volume is LV vol s, MOD    26.7 ml                    57 %. A2C: LV vol s, MOD    36.5 ml A4C:  3D Volume EF: LV SV MOD A2C:   45.3 ml       3D EF:        57 % LV SV MOD A4C:   82.5 ml       LV EDV:       106 ml LV SV MOD BP:    45.0 ml       LV ESV:       46 ml                                LV SV:        60 ml RIGHT VENTRICLE RV Basal diam:  3.70 cm RV Mid diam:    2.70 cm RV S prime:     9.36 cm/s TAPSE (M-mode): 1.6 cm LEFT ATRIUM             Index        RIGHT ATRIUM           Index LA diam:        3.40 cm 1.85 cm/m   RA Area:     13.20 cm LA Vol (A2C):   36.1 ml 19.63 ml/m  RA Volume:   33.00 ml  17.95 ml/m LA Vol (A4C):   43.5 ml 23.66 ml/m LA Biplane Vol: 38.7 ml 21.04 ml/m  AORTIC VALVE                    PULMONIC VALVE AV Area (Vmax):    2.30 cm     PV Vmax:       1.07 m/s AV Area (Vmean):   2.48 cm     PV Peak grad:  4.6 mmHg AV Area (VTI):     2.39 cm AV Vmax:           146.00 cm/s AV Vmean:          88.900 cm/s AV VTI:            0.268 m AV Peak Grad:      8.5 mmHg AV Mean Grad:      4.0 mmHg LVOT Vmax:         107.00 cm/s LVOT Vmean:        70.200 cm/s LVOT VTI:          0.204 m LVOT/AV VTI ratio: 0.76  AORTA Ao Root diam: 3.30 cm MITRAL VALVE               TRICUSPID VALVE MV Area (PHT): 4.39 cm    TR Peak grad:   27.7 mmHg MV Decel Time: 173 msec    TR Vmax:        263.00 cm/s MR Peak grad: 46.8 mmHg MR Vmax:      342.00 cm/s  SHUNTS MV E velocity: 76.00 cm/s  Systemic VTI:  0.20 m MV A velocity: 60.60 cm/s  Systemic Diam: 2.00 cm MV E/A ratio:  1.25 Mihai Croitoru MD Electronically signed by Sanda Klein MD Signature Date/Time: 12/12/2022/4:20:27 PM    Final         Scheduled Meds:  atorvastatin  40 mg Oral Daily   diltiazem  120 mg Oral Daily   [START ON 12/15/2022] ferrous sulfate  325 mg Oral Q breakfast   FLUoxetine  20 mg Oral Daily   folic  acid  1 mg Oral Daily   heparin injection (subcutaneous)  5,000 Units Subcutaneous Q8H  insulin aspart  0-9 Units Subcutaneous TID WC   isosorbide mononitrate  15 mg Oral Daily   mineral oil  1 enema Rectal Once   pantoprazole  40 mg Oral Daily   QUEtiapine  50 mg Oral QHS   sodium bicarbonate  650 mg Oral TID   Continuous Infusions:     LOS: 1 day    Time spent: 35 minutes    Zyah Gomm A Carolynne Schuchard, MD [facility]   If 7PM-7AM, please contact night-coverage [email or web address]  12/14/2022, 12:50 PM

## 2022-12-14 NOTE — Evaluation (Signed)
Physical Therapy Evaluation Patient Details Name: Michael [name] MRN: [id number] DOB: [birth year] Today's Date: 12/14/2022  History of Present Illness  72 yo male admitted with AKI, suicidal ideation, chest pain. Hx of HIV, Afib, anemia, asthma, CAD, MDD, CABG, CKD, drug use, DM, gout, PE  Clinical Impression  On eval, pt was Min guard A for mobility. He tolerated activity fairly well-was washing up on his own in the bathroom before working with PT. He reports he still has some chest pain that is slightly worsened with activity. He walked ~150 feet with a RW. He reports he was using a RW prior to this admission. He has a flat affect and speaks in a low volume voice level. Will plan to follow pt during this hospital stay. Recommend continued mobility with nursing and/or mobility team as well.        Recommendations for follow up therapy are one component of a multi-disciplinary discharge planning process, led by the attending physician.  Recommendations may be updated based on patient status, additional functional criteria and insurance authorization.  Follow Up Recommendations Outpatient PT      Assistance Recommended at Discharge PRN  Patient can return home with the following  Assist for transportation;Help with stairs or ramp for entrance;A little help with walking and/or transfers    Equipment Recommendations None recommended by PT  Recommendations for Other Services       Functional Status Assessment Patient has had a recent decline in their functional status and demonstrates the ability to make significant improvements in function in a reasonable and predictable amount of time.     Precautions / Restrictions Precautions Precautions: Fall Restrictions Weight Bearing Restrictions: No      Mobility  Bed Mobility Overal bed mobility: Modified Independent                  Transfers Overall transfer level: Modified independent                       Ambulation/Gait Ambulation/Gait assistance: Min guard Gait Distance (Feet): 150 Feet Assistive device: Rolling walker (2 wheels) Gait Pattern/deviations: Step-through pattern, Decreased stride length       General Gait Details: Min guard for safety. Dyspnea 2/4. Still reports having some chest pain with activity,  Stairs            Wheelchair Mobility    Modified Rankin (Stroke Patients Only)       Balance Overall balance assessment: Needs assistance, History of Falls         Standing balance support: Bilateral upper extremity supported, Reliant on assistive device for balance Standing balance-Leahy Scale: Fair                               Pertinent Vitals/Pain Pain Assessment Pain Assessment: Faces Faces Pain Scale: Hurts little more Pain Location: back, neck, chest Pain Descriptors / Indicators: Aching Pain Intervention(s): Monitored during session, Repositioned    Home Living Family/patient expects to be discharged to:: Shelter/Homeless                        Prior Function Prior Level of Function : History of Falls (last six months)             Mobility Comments: several falls. using RW most recently ADLs Comments: MOD I ADLs,     Hand Dominance  Extremity/Trunk Assessment   Upper Extremity Assessment Upper Extremity Assessment: Overall WFL for tasks assessed    Lower Extremity Assessment Lower Extremity Assessment: Generalized weakness    Cervical / Trunk Assessment Cervical / Trunk Assessment: Normal  Communication   Communication: No difficulties (low volum speech at times)  Cognition Arousal/Alertness: Awake/alert Behavior During Therapy: Flat affect Overall Cognitive Status: Within Functional Limits for tasks assessed                                          General Comments      Exercises     Assessment/Plan    PT Assessment Patient needs continued PT services  PT  Problem List Decreased strength;Decreased balance;Decreased range of motion;Decreased mobility;Decreased activity tolerance;Pain;Decreased knowledge of use of DME       PT Treatment Interventions DME instruction;Gait training;Functional mobility training;Therapeutic activities;Balance training;Patient/family education;Therapeutic exercise    PT Goals (Current goals can be found in the Care Plan section)  Acute Rehab PT Goals Patient Stated Goal: to get better PT Goal Formulation: With patient Time For Goal Achievement: 12/28/22 Potential to Achieve Goals: Good    Frequency Min 3X/week     Co-evaluation               AM-PAC PT "6 Clicks" Mobility  Outcome Measure Help needed turning from your back to your side while in a flat bed without using bedrails?: None Help needed moving from lying on your back to sitting on the side of a flat bed without using bedrails?: None Help needed moving to and from a bed to a chair (including a wheelchair)?: None Help needed standing up from a chair using your arms (e.g., wheelchair or bedside chair)?: None Help needed to walk in hospital room?: A Little Help needed climbing 3-5 steps with a railing? : A Little 6 Click Score: 22    End of Session Equipment Utilized During Treatment: Gait belt Activity Tolerance: Patient tolerated treatment well Patient left: in bed;with call bell/phone within reach;with bed alarm set;with nursing/sitter in room   PT Visit Diagnosis: History of falling (Z91.81);Muscle weakness (generalized) (M62.81);Repeated falls (R29.6);Difficulty in walking, not elsewhere classified (R26.2)    Time: [id number] PT Time Calculation (min) (ACUTE ONLY): 23 min   Charges:   PT Evaluation $PT Eval Low Complexity: 1 Low PT Treatments $Gait Training: 8-22 mins           Doreatha Massed, PT Acute Rehabilitation  Office: [phone]

## 2022-12-14 NOTE — Discharge Instructions (Signed)
[facility]): [address] [place], [place] [address]. This facility is open for urgent mental health crisis as well as outpatient care. They have a facility based crisis center which can help people with detox and residential placement.

## 2022-12-14 NOTE — Consult Note (Signed)
[facility] Followup Face-to-Face Psychiatric Evaluation  Service Date: December 14, 2022 LOS:  LOS: 1 day   Assessment  Michael [name] is a 72 y.o. male admitted medically for 12/11/2022 11:52 PM for AKI on CKD stage IIIb and chest pain. He carries the psychiatric diagnoses of major depressive disorder, cocaine use disorder and has a past medical history of CAD s/p CABG and stenting, HTN, HFrEF with recovered LVEF 60-65%, HIV, T2DM, gout, HLD, PAF on eliquis. Psychiatry was consulted for initially suicidal ideation by Dr. Olevia Bowens.  We were reconsulted today by Dr. Tyrell Antonio for "Please follow up on patient for disposition".    His ongoing presentation of suicidal ideation with no plan, inconsistency with suicidal ideation and in the setting of recent cocaine use is most consistent with substance-induced mood disorder. He meets criteria for substance-induced mood disorder based on recent worsening mood in the setting of recent cocaine use. He endorses daily outpatient use of cocaine. Current outpatient psychotropic medications include prozac 20, and seroquel '50mg'$  po qhs. and historically he has been non-compliant with medications prior to admission which makes it difficult to assess his response. This is evidenced by his inconsistent fill history. On examinationt oday, he was alert, oriented, and engaged well with interviewer. He had a 1:1 sitter (which was supposed to be discontinued after last psych eval). Given his current reported SI with no plan, will not recommend 1:1 sitter, he is future oriented, denies a plan, and historically will notify staff if suicidality changes. Please see plan below for detailed recommendations.   Patient continues to endorse chronic SI (and noted in chart review) but has denied active SI on multiple occasions and contracting for safety. I do not think another brief inpt hospital stay is likely to benefit patient at this point; furthermore he is uninterested  in traditional psychiatric hospitalization, stating "I need more than 2-5 days"; it is clear that he is looking for a long-term placement on repeat interview.  We recommend (again) discontinuing 1:1 given his chronic SI in the setting of unstable housing, chronic substance use, and chronic medical conditions. His depression appears to be stable at this time and would continue his prozac at current dose. He is interested in inpatient rehab for his substance use and we would prefer for him to have a door-to-door to inpatient substance use rehab from this hospitalization. We discussed that this may not be possible and discussed community resources placed in AVS. While he remains at chronic high risk of self-harm given history and ongoing substance use, acute factors (unmediated, cocaine w/d) have been addressed this admission. He does not meet IVC criteria. Unfortunately he is not a candidate for the [facility] given that it is an EMTALA violation.    Diagnoses:   Active Hospital problems: Principal Problem:   AKI (acute kidney injury) ([place]) Active Problems:   HIV (human immunodeficiency virus infection) ([place])   Cocaine use disorder ([place])   Hyperlipidemia with target LDL less than 100   Suicidal ideations   Essential hypertension   Precordial chest pain   Type II diabetes mellitus with renal manifestations ([name])   GERD (gastroesophageal reflux disease)   S/P CABG (coronary artery bypass graft)   Paroxysmal A-fib ([name])   MDD (major depressive disorder), recurrent episode, moderate ([name])   COPD (chronic obstructive pulmonary disease) ([name])   Anemia of chronic disease   Hyperkalemia    Plan  ## Safety and Observation Level:  - Based on my clinical evaluation, I estimate  the patient to be at low acute risk of self harm in the current setting, high chronic risk of self-harm - At this time, we recommend a routine level of observation. This decision is based on my review of the chart including patient's  history and current presentation, interview of the patient, mental status examination, and consideration of suicide risk including evaluating suicidal ideation, plan, intent, suicidal or self-harm behaviors, risk factors, and protective factors. This judgment is based on our ability to directly address suicide risk, implement suicide prevention strategies and develop a safety plan while the patient is in the clinical setting. Please contact our team if there is a concern that risk level has changed.  ## Medications:  -- CONTINUE prozac '20mg'$  for depression and Seroquel '50mg'$  po daily. Will dc Duloxetine, patient not clear when or who started this medications.   ## Medical Decision Making Capacity:  -- Not formally assessed   ## Further Work-up:  -- Per primary   -- most recent EKG on 3/7 had QtC of 432 -- Pertinent labwork reviewed earlier this admission includes: Cr 4.32 (down to 3.5), Hgb 9.0, UDS+cocaine, 123XX123, salicylate<7. Renal U/S normal   ## Disposition:  -- Prefer patient have door-to-door for inpatient rehab, although recognize this may not be possible -- TOC consult for substance abuse resources  ## Behavioral / Environmental:  --routine   ##Legal Status -Voluntary  Thank you for this consult request. Recommendations have been communicated to the primary team.  We will sign off at this time.   [name],    Follow-up history  Relevant Aspects of Hospital Course:  Admitted on 12/11/2022 for AKI on CKD stage IIIb.  Patient was recently admitted to [facility] for chest pain rule out. Seen by cardiology who recommended medical management, repeat testing, and followup. Noted to have difficulty with adherence and follow-up.    Patient Report:  Patient does not currently present with active symptoms of an acute manic or psychotic episode. He denied much memory of prior consults with NP Burt Ek ("I've met so many people") but otherwise memory seemed fairly  intact. He endorsed ongoing struggles with depression, and noted his frustration going from hospital to hospital trying to get help. He againd enied any active suicidal ideations or plan to me, stated he had no plans to do anything in the hospital, and that he would be able to go to the ED if he experienced this in the community.  He did not find his recent inpatient hospital stay to be helpful. He is future oriented and wants to be able to make it to a long-term inpt rehab; he has realistic concerns about whether or not he can go to [facility] with his medical frailty. When I left the room he commented that he was going to "keep on fighting, like I always do".   He is alert and oriented x 4.  His physical appearance is casual and appropriate for hospital environment.  He is calm, cooperative, communicated well with normal rate and normal volume.  His mood he describes as "okay".   Resources: Patient instructed to call 911/crisis if medical/psychiatric emergency of psychosis/SI/HI.    ROS:  As above   Collateral information:  Patient declines for anyone to be contacted.   Psychiatric History:  Information collected from chart review  Patient was at inpatient psychiatric hospital at [facility] 11/30/2022. At the time, was diagnosed with MDD and discharged with Duloxetine 20 and quetapine '50mg'$ . He was admitted for risk of  self-injury and medication non-compliance.   Family psych history: None noted in chart   Social History: (from prior consult this hospital stay)  Tobacco use: Denies Alcohol use: Denies Drug use: Reports cocaine use. Last use a week ago. Has been on and off for the past 30 years.   Completed up to 12th grade.  He is widowed.  Has 2 children (per chart review) Reports he is on disability, lives by himself and is homeless but sleeps at hotels.  Grew up in [place], [place] then went to [place], then got a bus back to [name].   Family History:  The patient's family history includes  Cancer in his father; Diabetes in his brother, father, mother, and sister; Heart disease in his brother; Hyperlipidemia in his mother; Hypertension in his father and mother.  Medical History: Past Medical History:  Diagnosis Date   A-fib ([place])    Anemia    Asthma    No PFTs, history of childhood asthma   CAD (coronary artery disease)    Cellulitis 04/2014   left facial   Cellulitis and abscess of toe of right foot 12/08/2019   Chondromalacia of medial femoral condyle    Left knee MRI 04/28/12: Chondromalacia of the medial femoral condyle with slight peripheral degeneration of the meniscocapsular junction of the medial meniscus; followed by sports medicine   Chronic kidney failure, stage 4 (severe) ([place])    Collagen vascular disease ([place])    Crack cocaine use    for 20+ years, has been enrolled in detox programs in the past   Depression    with history of hospitalization for suicidal ideation   Diabetes mellitus 2002   Diagnosed in 2002, started insulin in 2012   Gout    Headache(784.0)    CT head 08/2011: Periventricular and subcortical white matter hypodensities are most in keeping with chronic microangiopathic change   HIV infection ([place]) 08/2011   Followed by [name]   Hyperlipidemia    Hypertension    Pulmonary embolism [facility])     Surgical History: Past Surgical History:  Procedure Laterality Date   AMPUTATION Right 07/21/2019   Procedure: RIGHT SECOND TOE AMPUTATION;  Surgeon: [name], MD;  Location: [place];  Service: Orthopedics;  Laterality: Right;   BACK SURGERY     1988   BOWEL RESECTION     CARDIAC SURGERY     CERVICAL SPINE SURGERY     " rods in my neck "   CORONARY ARTERY BYPASS GRAFT     CORONARY STENT PLACEMENT     NM MYOCAR PERF WALL MOTION  12/27/2011   normal   SPINE SURGERY      Medications:   Current Facility-Administered Medications:    acetaminophen (TYLENOL) tablet 650 mg, 650 mg, Oral, Q6H PRN **OR** acetaminophen (TYLENOL)  suppository 650 mg, 650 mg, Rectal, Q6H PRN, Howerter, Justin B, DO   atorvastatin (LIPITOR) tablet 40 mg, 40 mg, Oral, Daily, Regalado, Belkys A, MD, 40 mg at 12/14/22 0831   diltiazem (CARDIZEM CD) 24 hr capsule 120 mg, 120 mg, Oral, Daily, Reubin Milan, MD, 120 mg at 12/14/22 0831   [START ON 12/15/2022] ferrous sulfate tablet 325 mg, 325 mg, Oral, Q breakfast, Regalado, Belkys A, MD   FLUoxetine (PROZAC) capsule 20 mg, 20 mg, Oral, Daily, Reubin Milan, MD, 20 mg at [id number]   folic acid (FOLVITE) tablet 1 mg, 1 mg, Oral, Daily, Regalado, Belkys A, MD, 1 mg at 12/14/22 1441   heparin  injection 5,000 Units, 5,000 Units, Subcutaneous, Q8H, Regalado, Belkys A, MD, 5,000 Units at 12/14/22 1441   insulin aspart (novoLOG) injection 0-9 Units, 0-9 Units, Subcutaneous, TID WC, Howerter, Justin B, DO, 1 Units at 12/14/22 0831   isosorbide mononitrate (IMDUR) 24 hr tablet 15 mg, 15 mg, Oral, Daily, Reubin Milan, MD, 15 mg at 12/14/22 0831   melatonin tablet 3 mg, 3 mg, Oral, QHS PRN, Howerter, Justin B, DO, 3 mg at 12/13/22 2141   naloxone (NARCAN) injection 0.4 mg, 0.4 mg, Intravenous, PRN, Howerter, Justin B, DO   ondansetron (ZOFRAN) injection 4 mg, 4 mg, Intravenous, Q6H PRN, Howerter, Justin B, DO   oxyCODONE (Oxy IR/ROXICODONE) immediate release tablet 5 mg, 5 mg, Oral, Q4H PRN, Reubin Milan, MD, 5 mg at 12/13/22 2142   pantoprazole (PROTONIX) EC tablet 40 mg, 40 mg, Oral, Daily, Regalado, Belkys A, MD, 40 mg at 12/14/22 [id number]   QUEtiapine (SEROQUEL) tablet 50 mg, 50 mg, Oral, QHS, Reubin Milan, MD, 50 mg at 12/13/22 2142   sodium bicarbonate tablet 650 mg, 650 mg, Oral, TID, Regalado, Belkys A, MD, 650 mg at 12/14/22 1800   sorbitol 70 % solution 30 mL, 30 mL, Oral, Daily PRN, Regalado, Belkys A, MD  Allergies: No Known Allergies   Objective  Vital signs:  Temp:  [98.3 F (36.8 C)-98.4 F (36.9 C)] 98.3 F (36.8 C) (03/09 1233) Pulse Rate:  [63-71] 66  (03/09 1233) Resp:  [14-20] 14 (03/09 1233) BP: (140-154)/(76-81) 147/76 (03/09 1233) SpO2:  [99 %-100 %] 100 % (03/09 1233) Weight:  [70 kg] 70 kg (03/09 0500)   Psychiatric Specialty Exam:  Presentation  General Appearance: Appropriate for Environment; Casual  Eye Contact:Good  Speech:Clear and Coherent; Normal Rate  Speech Volume:Decreased  Handedness:Right   Mood and Affect  Mood:Depressed  Affect:Congruent (did have some affective range, smiled when appropriate)   Thought Process  Thought Processes:Coherent; Linear  Descriptions of Associations:Intact  Orientation:Full (Time, Place and Person)  Thought Content:Logical  History of Schizophrenia/Schizoaffective disorder:No  Duration of Psychotic Symptoms:No data recorded Hallucinations:Hallucinations: None  Ideas of Reference:None  Suicidal Thoughts:Suicidal Thoughts: No  Homicidal Thoughts:No data recorded  Sensorium  Memory:Immediate Good; Recent Good; Remote Good  Judgment:Fair  Insight:Poor   Executive Functions  Concentration:Fair  Attention Span:Good  [place] of Knowledge:Good  Language:Good   Psychomotor Activity  Psychomotor Activity:Psychomotor Activity: Normal   Assets  Assets:Communication Skills; Desire for Improvement; Resilience   Sleep  Sleep:Sleep: Good    Memory: limited     Physical Exam Constitutional:      Appearance: the patient is laying on his back in the hospital bed, alert  Pulmonary:     Effort: Pulmonary effort is normal.  Neurological:     General: No focal deficit present.     Mental Status: the patient is oriented to person, place, month, year   Blood pressure (!) 147/76, pulse 66, temperature 98.3 F (36.8 C), temperature source Oral, resp. rate 14, height '5\' 8"'$  (1.727 m), weight 70 kg, SpO2 100 %. Body mass index is 23.46 kg/m.

## 2022-12-15 DIAGNOSIS — N179 Acute kidney failure, unspecified: Secondary | ICD-10-CM | POA: Diagnosis not present

## 2022-12-15 LAB — GLUCOSE, CAPILLARY
Glucose-Capillary: 91 mg/dL (ref 70–99)
Glucose-Capillary: 154 mg/dL — ABNORMAL HIGH (ref 70–99)
Glucose-Capillary: 159 mg/dL — ABNORMAL HIGH (ref 70–99)
Glucose-Capillary: 92 mg/dL (ref 70–99)

## 2022-12-15 MED ORDER — SORBITOL 70 % SOLN
30.0000 mL | Freq: Once | Status: AC
  Administered 2022-12-15: 30 mL via ORAL
  Filled 2022-12-15: qty 30

## 2022-12-15 MED ORDER — ORAL CARE MOUTH RINSE: 15.0000 mL | OROMUCOSAL | Status: DC | PRN

## 2022-12-15 NOTE — Progress Notes (Signed)
Mobility Specialist - Progress Note   12/15/22 1018  Mobility  Activity Ambulated with assistance in hallway  Level of Assistance Standby assist, set-up cues, supervision of patient - no hands on  Assistive Device Front wheel walker  Distance Ambulated (ft) 80 ft  Activity Response Tolerated well  Mobility Referral Yes  $Mobility charge 1 Mobility   Pt received in bed and agreeable to mobility. No complaints during session. Pt to EOB for breakfast after session with all needs met. Bed alarm turned back on & call bell in reach.   [facility]

## 2022-12-15 NOTE — Progress Notes (Signed)
PROGRESS NOTE    Michael [name]  [id number] DOB: [birth year] DOA: 12/11/2022 PCP: [name], MD   Brief Narrative: 72 year old with past medical history significant for paroxysmal A-fib, anemia, asthma, CAD, medial femoral condyle chondromalacia, stage IV CKD, collagen vascular disease, history of drug abuse (cocaine over more than 20 years), depression, diabetes type 2, gout, history of HIV, hypertension and PE, recent complaining of worsening chronic chest pain and suicidal ideation.  Patient also found to have AKI on CKD.  Evaluated by psych who recommended a routine level of observation.   Assessment & Plan:   Principal Problem:   AKI (acute kidney injury) ([place]) Active Problems:   Cocaine use disorder ([place])   Essential hypertension   Paroxysmal A-fib ([name])   HIV (human immunodeficiency virus infection) ([name])   Suicidal ideations   COPD (chronic obstructive pulmonary disease) ([name])   Hyperlipidemia with target LDL less than 100   Precordial chest pain   Type II diabetes mellitus with renal manifestations ([name])   GERD (gastroesophageal reflux disease)   S/P CABG (coronary artery bypass graft)   MDD (major depressive disorder), recurrent episode, moderate ([name])   Anemia of chronic disease   Hyperkalemia   1-AKI on CKD stage IV: -Metabolic  acidosis, hyperkalemia: -treated  with IV fluids -Strict I's and O's -Received Lokelma, potassium decreased to 4.9 -started  sodium bicarb,.  Renal function improved.    Precordial chest pain Status post CABG Reports worsening of his chronic chest pain, he has had chest pain since after he had CABG.  Pain is worse on palpation.  Likely musculoskeletal pain. Also chest pain in the setting of cocaine use Troponin not significantly elevated. Continue with Imdur, tylenol  ECHO; ejection fraction 50%, no significant changes since prior 2D echo MR angio chest: No convincing evidence of aortic dissection or other acute aortic  abnormality. Concentric hypertrophy of the left ventricular myocardium consistent with the clinical history of hypertension.  Cocaine use disorder: Counseling provided  Suicidal ideation, MDD: Continue fluoxetine and Seroquel Psych recommend continue observation Cymbalta discontinue by Psych. Patient wasn't clear when this medication was started.  Psych recommend door to door inpatient substance rehab if possible. SW consulted.   Hypertension: Continue with Cardizem  A-fib: continue with Cardizem But not to be a good candidate for anticoagulation.  HIV: Not currently on therapy Will need referral to ID>   PAD: Continue with bronchodilators as needed  Lipidemia: resume Lipitor.   Diabetes type 2 with renal manifestation SSI  Anemia of chronic disease: Monitor hb.   GERD: Continue with PPI  Abdominal pain, lower quadrant, likely related to constipation; KUB; significant stool burden.  Had BM.  Plan for sorbitol today.      Estimated body mass index is 23.5 kg/m as calculated from the following:   Height as of this encounter: '5\' 8"'$  (1.727 m).   Weight as of this encounter: 70.1 kg.   DVT prophylaxis: Heparin Code Status: Full code Family Communication: Discussed with patient Disposition Plan:  Status is: Inpatient Remains inpatient appropriate because: Needs door to door substance abuse rehab. Awaiting CM   Consultants:  Psych   Procedures:  ECHO  Antimicrobials:    Subjective: Still having some lower quadrant pain. Had BM yesterday   Objective: Vitals:   12/14/22 2101 12/15/22 0500 12/15/22 0509 12/15/22 1306  BP: (!) 156/87  (!) 150/85 (!) 141/86  Pulse: 70  68 70  Resp: '19  15 18  '$ Temp: 98.4 F (36.9 C)  98.1 F (36.7 C) 98.5 F (36.9 C)  TempSrc: Oral  Oral Oral  SpO2: 100%  98% 100%  Weight:  70.1 kg    Height:        Intake/Output Summary (Last 24 hours) at 12/15/2022 1506 Last data filed at 12/15/2022 1433 Gross per 24 hour  Intake  840 ml  Output 1450 ml  Net -610 ml    Filed Weights   12/13/22 0330 12/14/22 0500 12/15/22 0500  Weight: 68.4 kg 70 kg 70.1 kg    Examination:  General exam: NAD Respiratory system: CTA Cardiovascular system: S 1, S 2 RRR Gastrointestinal system: BS present, soft, nt Central nervous system: alert Extremities: no edema   Data Reviewed: I have personally reviewed following labs and imaging studies  CBC: Recent Labs  Lab 12/12/22 0019 12/12/22 0744 12/14/22 0423  WBC 5.4 4.7 2.9*  NEUTROABS 4.3 3.2  --   HGB 10.5* 9.0* 7.6*  HCT 33.8* 29.8* 23.7*  MCV 91.6 92.8 89.8  PLT 163 142* 114*    Basic Metabolic Panel: Recent Labs  Lab 12/12/22 0019 12/12/22 0744 12/13/22 1434 12/14/22 0423  NA 139 139 138 137  K 5.6* 4.9 4.6 4.6  CL 110 114* 113* 111  CO2 16* 16* 20* 20*  GLUCOSE 129* 127* 98 91  BUN 88* 85* 66* 61*  CREATININE 4.84* 4.26* 3.45* 3.50*  CALCIUM 9.0 8.5* 7.8* 7.5*  MG  --  2.3  --   --     GFR: Estimated Creatinine Clearance: 18.5 mL/min (A) (by C-G formula based on SCr of 3.5 mg/dL (H)). Liver Function Tests: Recent Labs  Lab 12/12/22 0019 12/12/22 0744  AST 28 24  ALT 31 26  ALKPHOS 81 82  BILITOT 0.8 0.8  PROT 8.4* 7.3  ALBUMIN 5.0 4.3    Recent Labs  Lab 12/12/22 0019  LIPASE 53*    No results for input(s): "AMMONIA" in the last 168 hours. Coagulation Profile: No results for input(s): "INR", "PROTIME" in the last 168 hours. Cardiac Enzymes: No results for input(s): "CKTOTAL", "CKMB", "CKMBINDEX", "TROPONINI" in the last 168 hours. BNP (last 3 results) No results for input(s): "PROBNP" in the last 8760 hours. HbA1C: No results for input(s): "HGBA1C" in the last 72 hours. CBG: Recent Labs  Lab 12/14/22 1159 12/14/22 1614 12/14/22 2057 12/15/22 0746 12/15/22 1130  GLUCAP 92 114* 129* 91 154*    Lipid Profile: No results for input(s): "CHOL", "HDL", "LDLCALC", "TRIG", "CHOLHDL", "LDLDIRECT" in the last 72  hours. Thyroid Function Tests: No results for input(s): "TSH", "T4TOTAL", "FREET4", "T3FREE", "THYROIDAB" in the last 72 hours. Anemia Panel: No results for input(s): "VITAMINB12", "FOLATE", "FERRITIN", "TIBC", "IRON", "RETICCTPCT" in the last 72 hours. Sepsis Labs: No results for input(s): "PROCALCITON", "LATICACIDVEN" in the last 168 hours.  No results found for this or any previous visit (from the past 240 hour(s)).       Radiology Studies: DG Abd 1 View  Result Date: 12/14/2022 CLINICAL DATA:  Generalized abdominal pain. EXAM: ABDOMEN - 1 VIEW COMPARISON:  10/12/2022 FINDINGS: Bowel gas pattern is nonobstructive. There is significant stool burden throughout nondilated loops of colon. No evidence for organomegaly or free intraperitoneal air. There chronic degenerative changes in the LEFT hip. Remote lumbosacral fusion. IMPRESSION: 1. Nonobstructive bowel gas pattern. 2. Significant stool burden. Electronically Signed   By: Nolon Nations M.D.   On: 12/14/2022 11:40        Scheduled Meds:  atorvastatin  40 mg Oral Daily   diltiazem  120 mg Oral Daily   ferrous sulfate  325 mg Oral Q breakfast   FLUoxetine  20 mg Oral Daily   folic acid  1 mg Oral Daily   heparin injection (subcutaneous)  5,000 Units Subcutaneous Q8H   insulin aspart  0-9 Units Subcutaneous TID WC   isosorbide mononitrate  15 mg Oral Daily   pantoprazole  40 mg Oral Daily   QUEtiapine  50 mg Oral QHS   sodium bicarbonate  650 mg Oral TID   Continuous Infusions:     LOS: 2 days    Time spent: 35 minutes    Dary Dilauro A Hilbert Briggs, MD [facility]   If 7PM-7AM, please contact night-coverage [email or web address]  12/15/2022, 3:06 PM

## 2022-12-16 ENCOUNTER — Inpatient Hospital Stay (HOSPITAL_COMMUNITY): Payer: 59

## 2022-12-16 ENCOUNTER — Other Ambulatory Visit (HOSPITAL_COMMUNITY): Payer: Self-pay

## 2022-12-16 ENCOUNTER — Other Ambulatory Visit: Payer: Self-pay

## 2022-12-16 DIAGNOSIS — N179 Acute kidney failure, unspecified: Secondary | ICD-10-CM | POA: Diagnosis not present

## 2022-12-16 LAB — GLUCOSE, CAPILLARY
Glucose-Capillary: 91 mg/dL (ref 70–99)
Glucose-Capillary: 110 mg/dL — ABNORMAL HIGH (ref 70–99)
Glucose-Capillary: 160 mg/dL — ABNORMAL HIGH (ref 70–99)
Glucose-Capillary: 173 mg/dL — ABNORMAL HIGH (ref 70–99)

## 2022-12-16 MED ORDER — SORBITOL 70 % SOLN
30.0000 mL | Freq: Once | Status: AC
  Administered 2022-12-16: 30 mL via ORAL
  Filled 2022-12-16: qty 30

## 2022-12-16 MED ORDER — IOHEXOL 9 MG/ML PO SOLN
ORAL | Status: AC
  Administered 2022-12-16: 500 mL
  Filled 2022-12-16: qty 1000

## 2022-12-16 MED ORDER — SODIUM BICARBONATE 650 MG PO TABS
650.0000 mg | ORAL_TABLET | Freq: Three times a day (TID) | ORAL | 0 refills | Status: DC
  Filled 2022-12-16: qty 12, 4d supply, fill #0
  Filled 2022-12-16: qty 90, 30d supply, fill #0
  Filled 2022-12-16: qty 78, 26d supply, fill #0

## 2022-12-16 MED ORDER — FOLIC ACID 1 MG PO TABS
1.0000 mg | ORAL_TABLET | Freq: Every day | ORAL | 0 refills | Status: DC
  Filled 2022-12-16 (×2): qty 30, 30d supply, fill #0

## 2022-12-16 MED ORDER — IOHEXOL 9 MG/ML PO SOLN
500.0000 mL | ORAL | Status: AC
  Administered 2022-12-16: 500 mL via ORAL

## 2022-12-16 MED ORDER — FLUOXETINE HCL 20 MG PO CAPS
20.0000 mg | ORAL_CAPSULE | Freq: Every day | ORAL | 3 refills | Status: DC
  Filled 2022-12-16: qty 30, 30d supply, fill #0

## 2022-12-16 MED ORDER — FERROUS SULFATE 325 (65 FE) MG PO TABS
325.0000 mg | ORAL_TABLET | Freq: Every day | ORAL | 3 refills | Status: DC
  Filled 2022-12-16: qty 30, 30d supply, fill #0

## 2022-12-16 MED ORDER — PANTOPRAZOLE SODIUM 40 MG PO TBEC
40.0000 mg | DELAYED_RELEASE_TABLET | Freq: Every day | ORAL | 0 refills | Status: DC
  Filled 2022-12-16: qty 30, 30d supply, fill #0

## 2022-12-16 NOTE — TOC Progression Note (Signed)
Transition of Care [facility]) - Progression Note    Patient Details  Name: Michael [name] MRN: [id number] Date of Birth: 08/01/1951  [facility] CM/SW [place], LCSW Phone Number: 12/16/2022, 12:49 PM  Clinical Narrative:    CSW met pt at bedside , pt requested to appeal discharge. CSW provided pt with IM detailed notice and HIN12. CSW informed pt he will need to contact [facility] and showed pt where contact number can be found on IM. TOC to await for letter from [facility]. TOC to follow.     Expected Discharge Plan: Home/Self Care Barriers to Discharge: Continued Medical Work up  Expected Discharge Plan and Services       Living arrangements for the past 2 months: Homeless Expected Discharge Date: 12/16/22                                     Social Determinants of Health (SDOH) Interventions SDOH Screenings   Food Insecurity: No Food Insecurity (12/16/2022)  Recent Concern: Food Insecurity - Food Insecurity Present (12/12/2022)  Housing: Medium Risk (12/12/2022)  Transportation Needs: No Transportation Needs (12/16/2022)  Recent Concern: Transportation Needs - Unmet Transportation Needs (12/12/2022)  Utilities: Not At Risk (12/12/2022)  Recent Concern: Utilities - At Risk (10/22/2022)  Alcohol Screen: Low Risk  (11/27/2022)  Depression (PHQ2-9): High Risk (11/12/2021)  Financial Resource Strain: Medium Risk (09/16/2018)  Physical Activity: Unknown (04/22/2019)  Social Connections: Socially Isolated (09/16/2018)  Stress: Stress Concern Present (09/16/2018)  Tobacco Use: Low Risk  (12/12/2022)    Readmission Risk Interventions    07/05/2022   10:08 AM  Readmission Risk Prevention Plan  Transportation Screening Complete  Medication Review ([place]) Complete  PCP or Specialist appointment within 3-5 days of discharge Complete  HRI or Force Complete  SW Recovery Care/Counseling Consult Complete  [place] Not Applicable

## 2022-12-16 NOTE — Progress Notes (Signed)
PROGRESS NOTE    Michael [name]  [id number] DOB: 1951/01/23 DOA: 12/11/2022 PCP: No primary care provider on file.   Brief Narrative: 72 year old with past medical history significant for paroxysmal A-fib, anemia, asthma, CAD, medial femoral condyle chondromalacia, stage IV CKD, collagen vascular disease, history of drug abuse (cocaine over more than 20 years), depression, diabetes type 2, gout, history of HIV, hypertension and PE, recent complaining of worsening chronic chest pain and suicidal ideation.  Patient also found to have AKI on CKD.  Evaluated by psych who recommended a routine level of observation.   Assessment & Plan:   Principal Problem:   AKI (acute kidney injury) ([place]) Active Problems:   Cocaine use disorder ([place])   Essential hypertension   Paroxysmal A-fib ([name])   HIV (human immunodeficiency virus infection) ([place])   Suicidal ideations   COPD (chronic obstructive pulmonary disease) ([name])   Hyperlipidemia with target LDL less than 100   Precordial chest pain   Type II diabetes mellitus with renal manifestations ([name])   GERD (gastroesophageal reflux disease)   S/P CABG (coronary artery bypass graft)   MDD (major depressive disorder), recurrent episode, moderate ([name])   Anemia of chronic disease   Hyperkalemia   1-AKI on CKD stage IV: -Metabolic  acidosis, hyperkalemia: -treated  with IV fluids -Strict I's and O's -Received Lokelma, potassium decreased to 4.9 -started  sodium bicarb,.  Renal function improved.    Precordial chest pain Status post CABG Reports worsening of his chronic chest pain, he has had chest pain since after he had CABG.  Pain is worse on palpation.  Likely musculoskeletal pain. Also chest pain in the setting of cocaine use Troponin not significantly elevated. Continue with Imdur, tylenol  ECHO; ejection fraction 50%, no significant changes since prior 2D echo MR angio chest: No convincing evidence of aortic dissection or  other acute aortic abnormality. Concentric hypertrophy of the left ventricular myocardium consistent with the clinical history of hypertension.  Cocaine use disorder: Counseling provided  Suicidal ideation, MDD: Continue fluoxetine and Seroquel Psych recommend continue observation Cymbalta discontinue by Psych. Patient wasn't clear when this medication was started.  Psych recommend door to door inpatient substance rehab if possible. SW consulted.   Hypertension: Continue with Cardizem  A-fib: continue with Cardizem But not to be a good candidate for anticoagulation.  HIV: Not currently on therapy Will need referral to ID>   PAD: Continue with bronchodilators as needed  Lipidemia: resume Lipitor.   Diabetes type 2 with renal manifestation SSI  Anemia of chronic disease: Monitor hb.   GERD: Continue with PPI  Abdominal pain, lower quadrant, likely related to constipation; KUB; significant stool burden.  Had BM.  Repeat sorbitol He doesn't feel ready for discharge due to abdominal pain-- will proceed with CT abdomen pelvis      Estimated body mass index is 23.6 kg/m as calculated from the following:   Height as of this encounter: '5\' 8"'$  (1.727 m).   Weight as of this encounter: 70.4 kg.   DVT prophylaxis: Heparin Code Status: Full code Family Communication: Discussed with patient Disposition Plan:  Status is: Inpatient Remains inpatient appropriate because: Needs door to door substance abuse rehab. Awaiting CM   Consultants:  Psych   Procedures:  ECHO  Antimicrobials:    Subjective: Report abdominal pain Chronic chest pain.   Objective: Vitals:   12/15/22 2026 12/16/22 0435 12/16/22 0435 12/16/22 1227  BP: (!) 142/81  (!) 147/86 (!) 154/87  Pulse: (!) 59  64  65  Resp: '18  19 20  '$ Temp: 97.9 F (36.6 C)  98.6 F (37 C) 98.3 F (36.8 C)  TempSrc: Oral  Oral Oral  SpO2: 100%  99% 100%  Weight:  70.4 kg    Height:        Intake/Output Summary  (Last 24 hours) at 12/16/2022 1606 Last data filed at 12/16/2022 1455 Gross per 24 hour  Intake 1100 ml  Output 1850 ml  Net -750 ml    Filed Weights   12/14/22 0500 12/15/22 0500 12/16/22 0435  Weight: 70 kg 70.1 kg 70.4 kg    Examination:  General exam: NAD Respiratory system: CTA Cardiovascular system: S 1, S 2 RRR Gastrointestinal system: BS present, soft, nt Central nervous system: Alert Extremities: no edema   Data Reviewed: I have personally reviewed following labs and imaging studies  CBC: Recent Labs  Lab 12/12/22 0019 12/12/22 0744 12/14/22 0423  WBC 5.4 4.7 2.9*  NEUTROABS 4.3 3.2  --   HGB 10.5* 9.0* 7.6*  HCT 33.8* 29.8* 23.7*  MCV 91.6 92.8 89.8  PLT 163 142* 114*    Basic Metabolic Panel: Recent Labs  Lab 12/12/22 0019 12/12/22 0744 12/13/22 1434 12/14/22 0423  NA 139 139 138 137  K 5.6* 4.9 4.6 4.6  CL 110 114* 113* 111  CO2 16* 16* 20* 20*  GLUCOSE 129* 127* 98 91  BUN 88* 85* 66* 61*  CREATININE 4.84* 4.26* 3.45* 3.50*  CALCIUM 9.0 8.5* 7.8* 7.5*  MG  --  2.3  --   --     GFR: Estimated Creatinine Clearance: 18.5 mL/min (A) (by C-G formula based on SCr of 3.5 mg/dL (H)). Liver Function Tests: Recent Labs  Lab 12/12/22 0019 12/12/22 0744  AST 28 24  ALT 31 26  ALKPHOS 81 82  BILITOT 0.8 0.8  PROT 8.4* 7.3  ALBUMIN 5.0 4.3    Recent Labs  Lab 12/12/22 0019  LIPASE 53*    No results for input(s): "AMMONIA" in the last 168 hours. Coagulation Profile: No results for input(s): "INR", "PROTIME" in the last 168 hours. Cardiac Enzymes: No results for input(s): "CKTOTAL", "CKMB", "CKMBINDEX", "TROPONINI" in the last 168 hours. BNP (last 3 results) No results for input(s): "PROBNP" in the last 8760 hours. HbA1C: No results for input(s): "HGBA1C" in the last 72 hours. CBG: Recent Labs  Lab 12/15/22 1647 12/15/22 2028 12/16/22 0740 12/16/22 1134 12/16/22 1600  GLUCAP 159* 92 91 110* 160*    Lipid Profile: No results  for input(s): "CHOL", "HDL", "LDLCALC", "TRIG", "CHOLHDL", "LDLDIRECT" in the last 72 hours. Thyroid Function Tests: No results for input(s): "TSH", "T4TOTAL", "FREET4", "T3FREE", "THYROIDAB" in the last 72 hours. Anemia Panel: No results for input(s): "VITAMINB12", "FOLATE", "FERRITIN", "TIBC", "IRON", "RETICCTPCT" in the last 72 hours. Sepsis Labs: No results for input(s): "PROCALCITON", "LATICACIDVEN" in the last 168 hours.  No results found for this or any previous visit (from the past 240 hour(s)).       Radiology Studies: No results found.      Scheduled Meds:  atorvastatin  40 mg Oral Daily   diltiazem  120 mg Oral Daily   ferrous sulfate  325 mg Oral Q breakfast   FLUoxetine  20 mg Oral Daily   folic acid  1 mg Oral Daily   heparin injection (subcutaneous)  5,000 Units Subcutaneous Q8H   insulin aspart  0-9 Units Subcutaneous TID WC   iohexol  500 mL Oral Q1H   isosorbide mononitrate  15  mg Oral Daily   pantoprazole  40 mg Oral Daily   QUEtiapine  50 mg Oral QHS   sodium bicarbonate  650 mg Oral TID   Continuous Infusions:     LOS: 3 days    Time spent: 35 minutes    Tomislav Micale A Nayvie Lips, MD [facility]   If 7PM-7AM, please contact night-coverage [email or web address]  12/16/2022, 4:06 PM

## 2022-12-16 NOTE — Care Management Important Message (Signed)
Important Message  Patient Details  Name: Michael [name] MRN: [id number] Date of Birth: 1951/01/17   Medicare Important Message Given:  Yes     Memory Argue 12/16/2022, 1:04 PM

## 2022-12-16 NOTE — TOC Initial Note (Signed)
Transition of Care [facility]) - Initial/Assessment Note    Patient Details  Name: Michael [name] MRN: [id number] Date of Birth: Dec 25, 1950  [facility] CM/SW Contact:    Illene Regulus, LCSW Phone Number: 12/16/2022, 10:39 AM  Clinical Narrative:                 CSW spoke with pt regarding SDOH screen and substance abuse. Pt denies having both food insecurities and transportation issues. CSW has attached resources to pt's AVS. CSW also provided contact number for  Jettie Pagan with [facility] to check availability for inpatient detox. No additional needs TOC sign off.     Expected Discharge Plan: Home/Self Care Barriers to Discharge: Continued Medical Work up   Patient Goals and CMS Choice Patient states their goals for this hospitalization and ongoing recovery are:: work ot get inpatient rehab          Expected Discharge Plan and [place] arrangements for the past 2 months: Homeless                                      Prior Living Arrangements/Services Living arrangements for the past 2 months: Homeless Lives with:: Self Patient language and need for interpreter reviewed:: Yes        Need for Family Participation in Patient Care: No (Comment) Care giver support system in place?: No (comment)   Criminal Activity/Legal Involvement Pertinent to Current Situation/Hospitalization: No - Comment as needed  Activities of Daily Living Home Assistive Devices/Equipment: None ADL Screening (condition at time of admission) Patient's cognitive ability adequate to safely complete daily activities?: Yes Is the patient deaf or have difficulty hearing?: No Does the patient have difficulty seeing, even when wearing glasses/contacts?: No Does the patient have difficulty concentrating, remembering, or making decisions?: No Patient able to express need for assistance with ADLs?: Yes Does the patient have difficulty dressing or  bathing?: No Independently performs ADLs?: Yes (appropriate for developmental age) Does the patient have difficulty walking or climbing stairs?: No Weakness of Legs: None Weakness of Arms/Hands: None  Permission Sought/Granted                  Emotional Assessment Appearance:: Appears stated age Attitude/Demeanor/Rapport: Complaining, Gracious Affect (typically observed): Accepting, Agitated Orientation: : Oriented to Self, Oriented to Place, Oriented to  Time, Oriented to Situation Alcohol / Substance Use: Illicit Drugs Psych Involvement: Yes (comment)  Admission diagnosis:  Cocaine abuse ([place]) [F14.10] AKI (acute kidney injury) ([place]) [N17.9] Chest pain, unspecified type [R07.9] Patient Active Problem List   Diagnosis Date Noted   AKI (acute kidney injury) ([place]) 12/12/2022   Hyperkalemia 10/05/2022   CKD (chronic kidney disease), stage IV ([place]) 09/23/2022   Polysubstance abuse ([place]) 09/13/2022   ARF (acute renal failure) ([place]) 09/09/2022   (HFpEF) heart failure with preserved ejection fraction ([place]) 09/09/2022   Hypertensive urgency 12/09/2021   Impaired functional mobility, balance, and endurance 11/29/2021   Dilated cardiomyopathy ([place]) 11/29/2021   Arthritis 11/29/2021   Diabetic foot infection ([place]) 06/20/2021   Presence of aortocoronary bypass graft 04/16/2021   Iron deficiency anemia 04/04/2021   Atypical chest pain 04/03/2021   Anemia of chronic disease 12/14/2020   Type 2 diabetes mellitus with hyperlipidemia ([place]) 08/14/2020   Idiopathic chronic gout of multiple sites without tophus 02/17/2020   Diabetic ulcer of toe of right foot  associated with type 2 diabetes mellitus ([place]) 12/17/2019   Vitamin B12 deficiency 12/17/2019   Homelessness 12/13/2019   Cellulitis 12/07/2019   Osteomyelitis ([place]) 07/17/2019   Abscess or cellulitis of toe, right    Toe pain, right-second    MDD (major depressive disorder), recurrent episode, severe ([place]) 06/15/2019    Respiratory distress 04/12/2019   Pneumonia due to severe acute respiratory syndrome coronavirus 2 (SARS-CoV-2) 04/11/2019   COVID-19 virus infection 03/19/2019   Chest pain 10/03/2018   Malnutrition of moderate degree 09/21/2018   MDD (major depressive disorder), recurrent episode, moderate ([name])    Type II diabetes mellitus with renal manifestations ([place]) 05/04/2018   GERD (gastroesophageal reflux disease) 05/04/2018   S/P CABG (coronary artery bypass graft)    Left testicular pain    Depression 02/20/2018   Epididymo-orchitis, acute 02/20/2018   Essential hypertension 02/20/2018   Precordial chest pain 02/20/2018   Acute kidney injury superimposed on chronic kidney disease ([place]) 02/14/2018   HCAP (healthcare-associated pneumonia) 02/14/2018   History of pulmonary embolism 07/04/2017   Acute hyponatremia 05/11/2017   Hyperglycemia due to type 2 diabetes mellitus ([place]) 05/11/2017   CHF (congestive heart failure) ([place]) 04/01/2017   Hepatitis C 12/30/2016   Substance induced mood disorder ([place]) 11/23/2016   Chronic ischemic heart disease 11/12/2016   Paroxysmal A-fib ([place]) 11/12/2016   Back pain 04/18/2016   S/P carotid endarterectomy 11/15/2015   COPD (chronic obstructive pulmonary disease) ([place]) 10/22/2015   Stenosis of cervical spine with myelopathy ([place]) 10/20/2015   MDD (major depressive disorder), recurrent severe, without psychosis ([place]) 09/09/2015   History of fusion of cervical spine 08/28/2015   Gout 07/10/2015   Acute renal failure superimposed on stage 3 chronic kidney disease ([place]) 03/06/2015   Chronic kidney disease, stage III (moderate) ([place]) 03/06/2015   Encounter for general adult medical examination with abnormal findings 02/09/2015   Major depressive disorder, recurrent, severe without psychotic features ([place])    Suicidal ideations 08/15/2014   Cervicalgia 06/28/2014   Lumbar radiculopathy, chronic 06/28/2014   Asthma, chronic 02/03/2014   S/P percutaneous  transluminal coronary angioplasty 10/15/2013   3-vessel CAD 06/24/2013   ED (erectile dysfunction) of organic origin 07/07/2012   Hypertension goal BP (blood pressure) < 140/80 04/29/2012   Chondromalacia of left knee 03/19/2012   Hyperlipidemia with target LDL less than 100 02/12/2012   Fibromyalgia 02/12/2012   Pure hypercholesterolemia 02/12/2012   Cocaine use disorder ([place]) 01/10/2012    Class: Acute   Human immunodeficiency virus (HIV) disease ([place]) 12/16/2011   HIV (human immunodeficiency virus infection) ([place]) 08/27/2011   Uncontrolled type 2 diabetes with neuropathy 10/17/2000   PCP:  [name], MD Pharmacy:   [name] [address] [place] [address] Phone: [phone] Fax: [place] [address] [place] [address] Phone: [phone] Fax: [phone]  [place], [place] [address] [place] [address] Phone: [phone] Fax: [place] [address] Phone: [phone] Fax: [phone]  [place] [address] Phone: [phone] Fax: [phone]     Social Determinants of Health (SDOH) Social History: SDOH Screenings   Food Insecurity: No Food Insecurity (12/16/2022)  Recent Concern: Food Insecurity - Food Insecurity Present (12/12/2022)  Housing: Medium Risk (12/12/2022)  Transportation Needs: No Transportation  Needs (12/16/2022)  Recent Concern: Transportation Needs - Unmet Transportation Needs (12/12/2022)  Utilities: Not At Risk (12/12/2022)  Recent Concern: Utilities - At Risk (10/22/2022)  Alcohol Screen: Low Risk  (11/27/2022)  Depression (PHQ2-9): High Risk (11/12/2021)  Financial Resource Strain: Medium Risk (09/16/2018)  Physical Activity: Unknown  (04/22/2019)  Social Connections: Socially Isolated (09/16/2018)  Stress: Stress Concern Present (09/16/2018)  Tobacco Use: Low Risk  (12/12/2022)   SDOH Interventions: Food Insecurity Interventions: Intervention Not Indicated, Inpatient TOC Transportation Interventions: Intervention Not Indicated, Inpatient TOC   Readmission Risk Interventions    07/05/2022   10:08 AM  Readmission Risk Prevention Plan  Transportation Screening Complete  Medication Review ([place]) Complete  PCP or Specialist appointment within 3-5 days of discharge Complete  HRI or [place] Complete  SW Recovery Care/Counseling Consult Complete  [place] Not Applicable

## 2022-12-17 ENCOUNTER — Other Ambulatory Visit (HOSPITAL_COMMUNITY): Payer: Self-pay

## 2022-12-17 DIAGNOSIS — N179 Acute kidney failure, unspecified: Secondary | ICD-10-CM | POA: Diagnosis not present

## 2022-12-17 LAB — BASIC METABOLIC PANEL
Anion gap: 7 (ref 5–15)
BUN: 49 mg/dL — ABNORMAL HIGH (ref 8–23)
CO2: 21 mmol/L — ABNORMAL LOW (ref 22–32)
Calcium: 8 mg/dL — ABNORMAL LOW (ref 8.9–10.3)
Chloride: 111 mmol/L (ref 98–111)
Creatinine, Ser: 3.35 mg/dL — ABNORMAL HIGH (ref 0.61–1.24)
GFR, Estimated: 19 mL/min — ABNORMAL LOW (ref >60)
Glucose, Bld: 93 mg/dL (ref 70–99)
Potassium: 4.9 mmol/L (ref 3.5–5.1)
Sodium: 139 mmol/L (ref 135–145)

## 2022-12-17 LAB — URINALYSIS, ROUTINE W REFLEX MICROSCOPIC
Bilirubin Urine: NEGATIVE
Glucose, UA: NEGATIVE mg/dL
Hgb urine dipstick: NEGATIVE
Ketones, ur: NEGATIVE mg/dL
Leukocytes,Ua: NEGATIVE
Nitrite: NEGATIVE
Protein, ur: 100 mg/dL — AB
Specific Gravity, Urine: 1.011 (ref 1.005–1.030)
pH: 7 (ref 5.0–8.0)

## 2022-12-17 LAB — GLUCOSE, CAPILLARY
Glucose-Capillary: 82 mg/dL (ref 70–99)
Glucose-Capillary: 164 mg/dL — ABNORMAL HIGH (ref 70–99)
Glucose-Capillary: 97 mg/dL (ref 70–99)
Glucose-Capillary: 114 mg/dL — ABNORMAL HIGH (ref 70–99)

## 2022-12-17 MED ORDER — POLYETHYLENE GLYCOL 3350 17 G PO PACK
17.0000 g | PACK | Freq: Two times a day (BID) | ORAL | 0 refills | Status: DC
  Filled 2022-12-17: qty 14, 7d supply, fill #0

## 2022-12-17 MED ORDER — POLYETHYLENE GLYCOL 3350 17 G PO PACK
17.0000 g | PACK | Freq: Two times a day (BID) | ORAL | Status: DC
  Administered 2022-12-20: 17 g via ORAL
  Filled 2022-12-17 (×8): qty 1

## 2022-12-17 MED ORDER — LACTULOSE ENEMA
300.0000 mL | Freq: Once | ORAL | Status: AC
  Administered 2022-12-17: 300 mL via RECTAL
  Filled 2022-12-17: qty 300

## 2022-12-17 NOTE — Progress Notes (Signed)
Physical Therapy Treatment Patient Details Name: Michael [name] MRN: [id number] DOB: Feb 24, 1951 Today's Date: 12/17/2022   History of Present Illness 72 yo male admitted with AKI, suicidal ideation, chest pain. Hx of HIV, Afib, anemia, asthma, CAD, MDD, CABG, CKD, drug use, DM, gout, PE    PT Comments    Pt admitted with above diagnosis.  Pt currently with functional limitations due to the deficits listed below (see PT Problem List). Pt in bed and agreeable to therapy intervention. Pt is mod I with bed mobility and transfer tasks at RW level demonstrating safe and proper technique with UE and AD placement, S for gait tasks with step almost though pattern with slow cadence for 120 feet. Pt indicated no increased pain, mild SOB and dizziness at end of gait bout. Pt able to participate with standing NMR challenges without UE support reaching outside BOS and crossing midline, weight shifting anterior/posterior and lateral and able to accept minimal external perturbation with LOB x 1 and min A to recover, pt demonstrates posterior LOB and is able to follow commands for anterior weight shift. Pt left seated in chair all needs in place and met. Pt will benefit from skilled PT to increase their independence and safety with mobility to allow discharge to the venue listed below.      Recommendations for follow up therapy are one component of a multi-disciplinary discharge planning process, led by the attending physician.  Recommendations may be updated based on patient status, additional functional criteria and insurance authorization.  Follow Up Recommendations  Outpatient PT     Assistance Recommended at Discharge PRN  Patient can return home with the following Assist for transportation;Help with stairs or ramp for entrance;A little help with walking and/or transfers   Equipment Recommendations  None recommended by PT    Recommendations for Other Services       Precautions / Restrictions  Precautions Precautions: Fall Restrictions Weight Bearing Restrictions: No     Mobility  Bed Mobility Overal bed mobility: Modified Independent                  Transfers Overall transfer level: Modified independent                 General transfer comment: pt demonstrates good recall for UE and AD placement    Ambulation/Gait Ambulation/Gait assistance: Min guard, Supervision Gait Distance (Feet): 120 Feet Assistive device: Rolling walker (2 wheels) Gait Pattern/deviations: Decreased step length - left Gait velocity: decreased     General Gait Details: SOB and pain monitored during gait tasks pt indicated mild dizziness at end of gait bout   Stairs             Wheelchair Mobility    Modified Rankin (Stroke Patients Only)       Balance Overall balance assessment: Needs assistance, History of Falls         Standing balance support: No upper extremity supported Standing balance-Leahy Scale: Fair Standing balance comment: pt participated with standing NMR tasks reaching outside BOS and crossing midline, lateral and anterior/posterior weight shift and pt able to accept minimal external perturbation with posterior LOB and pt limited with WB through anterior portion of B feet cues for posture anterior weight shift and pt required cues for implementing self recovery stratagies and min A x 1 due to LOB  Cognition Arousal/Alertness: Awake/alert Behavior During Therapy: WFL for tasks assessed/performed Overall Cognitive Status: Within Functional Limits for tasks assessed                                          Exercises      General Comments        Pertinent Vitals/Pain Pain Assessment Pain Assessment: Faces Faces Pain Scale: Hurts a little bit Pain Location: chest Pain Descriptors / Indicators: Aching Pain Intervention(s): Monitored during session    Home Living                           Prior Function            PT Goals (current goals can now be found in the care plan section) Acute Rehab PT Goals Patient Stated Goal: to get better PT Goal Formulation: With patient Time For Goal Achievement: 12/28/22 Potential to Achieve Goals: Good Progress towards PT goals: Progressing toward goals    Frequency    Min 3X/week      PT Plan Current plan remains appropriate    Co-evaluation              AM-PAC PT "6 Clicks" Mobility   Outcome Measure  Help needed turning from your back to your side while in a flat bed without using bedrails?: None Help needed moving from lying on your back to sitting on the side of a flat bed without using bedrails?: None Help needed moving to and from a bed to a chair (including a wheelchair)?: None Help needed standing up from a chair using your arms (e.g., wheelchair or bedside chair)?: None Help needed to walk in hospital room?: A Little Help needed climbing 3-5 steps with a railing? : A Little 6 Click Score: 22    End of Session Equipment Utilized During Treatment: Gait belt Activity Tolerance: Patient tolerated treatment well Patient left: in chair;with call bell/phone within reach   PT Visit Diagnosis: History of falling (Z91.81);Muscle weakness (generalized) (M62.81);Repeated falls (R29.6);Difficulty in walking, not elsewhere classified (R26.2);Unsteadiness on feet (R26.81)     Time: [id number] PT Time Calculation (min) (ACUTE ONLY): 28 min  Charges:  $Gait Training: 8-22 mins $Neuromuscular Re-education: 8-22 mins                     Baird Lyons, PT   Adair Patter 12/17/2022, 11:25 AM

## 2022-12-17 NOTE — TOC Progression Note (Signed)
Transition of Care [facility]) - Progression Note    Patient Details  Name: Michael [name] MRN: [id number] Date of Birth: 1950/12/15  Transition of Care [facility]) CM/SW [place], LCSW Phone Number: 12/17/2022, 3:41 PM  Clinical Narrative:     Will await official call from [facility].   Expected Discharge Plan: Home/Self Care Barriers to Discharge: Continued Medical Work up  Expected Discharge Plan and Services       Living arrangements for the past 2 months: Homeless Expected Discharge Date: 12/17/22                                     Social Determinants of Health (SDOH) Interventions SDOH Screenings   Food Insecurity: No Food Insecurity (12/16/2022)  Recent Concern: Food Insecurity - Food Insecurity Present (12/12/2022)  Housing: Medium Risk (12/12/2022)  Transportation Needs: No Transportation Needs (12/16/2022)  Recent Concern: Transportation Needs - Unmet Transportation Needs (12/12/2022)  Utilities: Not At Risk (12/12/2022)  Recent Concern: Utilities - At Risk (10/22/2022)  Alcohol Screen: Low Risk  (11/27/2022)  Depression (PHQ2-9): High Risk (11/12/2021)  Financial Resource Strain: Medium Risk (09/16/2018)  Physical Activity: Unknown (04/22/2019)  Social Connections: Socially Isolated (09/16/2018)  Stress: Stress Concern Present (09/16/2018)  Tobacco Use: Low Risk  (12/12/2022)    Readmission Risk Interventions    07/05/2022   10:08 AM  Readmission Risk Prevention Plan  Transportation Screening Complete  Medication Review ([place]) Complete  PCP or Specialist appointment within 3-5 days of discharge Complete  HRI or [place] Complete  SW Recovery Care/Counseling Consult Complete  [place] Not Applicable

## 2022-12-17 NOTE — TOC Progression Note (Signed)
Transition of Care [facility]) - Progression Note    Patient Details  Name: Michael [name] MRN: [id number] Date of Birth: 1951/02/06  Transition of Care [facility]) CM/SW [place], LCSW Phone Number: 12/17/2022, 9:48 AM  Clinical Narrative:     After review of prior TOC note and MD medical review no d/c yesterday. Appeal process not done.TOC to follow.     Expected Discharge Plan: Home/Self Care Barriers to Discharge: Continued Medical Work up  Expected Discharge Plan and Services       Living arrangements for the past 2 months: Homeless Expected Discharge Date: 12/16/22                                     Social Determinants of Health (SDOH) Interventions SDOH Screenings   Food Insecurity: No Food Insecurity (12/16/2022)  Recent Concern: Food Insecurity - Food Insecurity Present (12/12/2022)  Housing: Medium Risk (12/12/2022)  Transportation Needs: No Transportation Needs (12/16/2022)  Recent Concern: Transportation Needs - Unmet Transportation Needs (12/12/2022)  Utilities: Not At Risk (12/12/2022)  Recent Concern: Utilities - At Risk (10/22/2022)  Alcohol Screen: Low Risk  (11/27/2022)  Depression (PHQ2-9): High Risk (11/12/2021)  Financial Resource Strain: Medium Risk (09/16/2018)  Physical Activity: Unknown (04/22/2019)  Social Connections: Socially Isolated (09/16/2018)  Stress: Stress Concern Present (09/16/2018)  Tobacco Use: Low Risk  (12/12/2022)    Readmission Risk Interventions    07/05/2022   10:08 AM  Readmission Risk Prevention Plan  Transportation Screening Complete  Medication Review ([place]) Complete  PCP or Specialist appointment within 3-5 days of discharge Complete  HRI or [place] Complete  SW Recovery Care/Counseling Consult Complete  [place] Not Applicable

## 2022-12-17 NOTE — Discharge Summary (Addendum)
Physician Discharge Summary   Patient: Michael [name] MRN: [id number] DOB: [birth year]  Admit date:     12/11/2022  Discharge date: 12/17/22  Discharge Physician: Elmarie Shiley   PCP: No primary care provider on file.   Recommendations at discharge:   Follow Up with ID for further care of HIV Follow up with nephrology for care renal failure.  Encourage Cocaine cessation    Discharge Diagnoses: Principal Problem:   AKI (acute kidney injury) ([place]) Active Problems:   Cocaine use disorder ([place])   Essential hypertension   Paroxysmal A-fib ([name])   HIV (human immunodeficiency virus infection) ([place])   Suicidal ideations   COPD (chronic obstructive pulmonary disease) ([place])   Hyperlipidemia with target LDL less than 100   Precordial chest pain   Type II diabetes mellitus with renal manifestations ([name])   GERD (gastroesophageal reflux disease)   S/P CABG (coronary artery bypass graft)   MDD (major depressive disorder), recurrent episode, moderate ([name])   Anemia of chronic disease   Hyperkalemia  Resolved Problems:   * No resolved hospital problems. *  Hospital Course: 72 year old with past medical history significant for paroxysmal A-fib, anemia, asthma, CAD, medial femoral condyle chondromalacia, stage IV CKD, collagen vascular disease, history of drug abuse (cocaine over more than 20 years), depression, diabetes type 2, gout, history of HIV, hypertension and PE, recent complaining of worsening chronic chest pain and suicidal ideation.   Patient also found to have AKI on CKD.  Evaluated by psych who recommended a routine level of observation.  Assessment and Plan: 1-AKI on CKD stage IV: -Metabolic  Acidosis, Hyperkalemia: -Treated  with IV fluids -Strict I's and O's -Received Lokelma, potassium decreased to 4.9 -Started  sodium bicarb, continue.  Renal function improved. Stable.      Precordial chest pain Status post CABG Reports worsening of his chronic chest pain,  he has had chest pain since after he had CABG.  Pain is worse on palpation.  Likely musculoskeletal pain. Also chest pain in the setting of cocaine use Troponin not significantly elevated. Continue with Imdur, tylenol  ECHO; ejection fraction 50%, no significant changes since prior 2D echo MR angio chest: No convincing evidence of aortic dissection or other acute aortic abnormality. Concentric hypertrophy of the left ventricular myocardium consistent with the clinical history of hypertension. Chronic stable.   Cocaine use disorder: Counseling provided. Recurrent admission for Same.    Suicidal ideation, MDD: Continue fluoxetine and Seroquel Psych recommend continue observation Cymbalta discontinue by Psych. Patient wasn't clear when this medication was started.  Psych recommend door to door inpatient substance rehab if possible. SW consulted. Patient  was provided information for rehab.    Hypertension: Continue with Cardizem   A-fib: continue with Cardizem But not to be a good candidate for anticoagulation.   HIV: Not currently on therapy Will need follow up with  ID>    PAD: Continue with bronchodilators as needed   Lipidemia: resume Lipitor.    Diabetes type 2 with renal manifestation SSI   Anemia of chronic disease: Monitor hb.    GERD: Continue with PPI   Abdominal pain, lower quadrant, likely related to constipation; KUB; significant stool burden.  Had BM.  Repeat enema today.  CT scan : Diffuse bladder wall thickening versus normal under distension. Correlate clinically for cystitis. 2. Large amount of stool throughout the colon.  UA negative.  Tolerating diet.           Consultants: Psych  Procedures performed:  Disposition:  Home Diet recommendation:  Discharge Diet Orders (From admission, onward)     Start     Ordered   12/17/22 0000  Diet - low sodium heart healthy        12/17/22 1253   12/16/22 0000  Diet - low sodium heart healthy         12/16/22 1137           Cardiac diet DISCHARGE MEDICATION: Allergies as of 12/17/2022   No Known Allergies      Medication List     STOP taking these medications    DULoxetine 20 MG capsule Commonly known as: CYMBALTA       TAKE these medications    Accu-Chek Guide test strip Generic drug: glucose blood use as directed up to four times daily   Accu-Chek Guide w/Device Kit Use as directed.   Accu-Chek Softclix Lancets lancets Use as directed up to four times daily   albuterol 108 (90 Base) MCG/ACT inhaler Commonly known as: VENTOLIN HFA Inhale 1-2 puffs into the lungs every 6 (six) hours as needed for wheezing or shortness of breath.   atorvastatin 40 MG tablet Commonly known as: LIPITOR Take 1 tablet (40 mg total) by mouth daily.   BD Pen Needle Nano U/F 32G X 4 MM Misc Generic drug: Insulin Pen Needle Use as directed up to 4 times daily.   Breo Ellipta 200-25 MCG/ACT Aepb Generic drug: fluticasone furoate-vilanterol Inhale 1 puff into the lungs daily.   diltiazem 120 MG 24 hr capsule Commonly known as: CARDIZEM CD Take 1 capsule (120 mg total) by mouth daily.   ferrous sulfate 325 (65 FE) MG tablet Take 1 tablet (325 mg total) by mouth daily with breakfast.   FLUoxetine 20 MG capsule Commonly known as: PROZAC Take 1 capsule (20 mg total) by mouth daily.   folic acid 1 MG tablet Commonly known as: FOLVITE Take 1 tablet (1 mg total) by mouth daily.   isosorbide mononitrate 30 MG 24 hr tablet Commonly known as: IMDUR Take 0.5 tablets (15 mg total) by mouth daily.   nitroGLYCERIN 0.4 MG SL tablet Commonly known as: NITROSTAT Place 1 tablet (0.4 mg total) under the tongue every 5 (five) minutes as needed for chest pain.   pantoprazole 40 MG tablet Commonly known as: PROTONIX Take 1 tablet (40 mg total) by mouth daily.   polyethylene glycol 17 g packet Commonly known as: MIRALAX / GLYCOLAX Take 17 g by mouth 2 (two) times daily.    QUEtiapine 50 MG tablet Commonly known as: SEROQUEL Take 1 tablet (50 mg total) by mouth at bedtime.   sodium bicarbonate 650 MG tablet Take 1 tablet (650 mg total) by mouth 3 (three) times daily.        Follow-up Information     [name], MD Follow up in 1 week(s).   Specialty: Internal Medicine Contact information: [place] [address] [phone]                Discharge Exam: Danley Danker Weights   12/15/22 0500 12/16/22 0435 12/17/22 0500  Weight: 70.1 kg 70.4 kg 67.6 kg   General NAD  Condition at discharge: stable  The results of significant diagnostics from this hospitalization (including imaging, microbiology, ancillary and laboratory) are listed below for reference.   Imaging Studies: CT ABDOMEN PELVIS WO CONTRAST  Result Date: 12/16/2022 CLINICAL DATA:  Acute abdominal pain EXAM: CT ABDOMEN AND PELVIS WITHOUT CONTRAST TECHNIQUE: Multidetector CT imaging of  the abdomen and pelvis was performed following the standard protocol without IV contrast. RADIATION DOSE REDUCTION: This exam was performed according to the departmental dose-optimization program which includes automated exposure control, adjustment of the mA and/or kV according to patient size and/or use of iterative reconstruction technique. COMPARISON:  Abdominal x-ray 12/14/2022. CT abdomen and pelvis 11/10/2022. FINDINGS: Lower chest: No acute abnormality. Hepatobiliary: No focal liver abnormality is seen. No gallstones, gallbladder wall thickening, or biliary dilatation. Pancreas: Unremarkable. No pancreatic ductal dilatation or surrounding inflammatory changes. Spleen: Normal in size without focal abnormality. Adrenals/Urinary Tract: There is diffuse bladder wall thickening versus normal under distension. The kidneys and adrenal glands are within normal limits. Stomach/Bowel: Stomach is within normal limits. No evidence of bowel wall thickening, distention, or  inflammatory changes. Appendix is not seen. There is a large amount of stool throughout the colon. Vascular/Lymphatic: Aortic atherosclerosis. No enlarged abdominal or pelvic lymph nodes. Reproductive: Prostate is unremarkable. Other: There is a small fat containing periumbilical hernia. There is no ascites. Musculoskeletal: Multilevel degenerative changes affect the spine. Posterior fusion hardware seen at L5-S1. Sternotomy wires are present. Degenerative changes affect the hips, left greater than right. IMPRESSION: 1. Diffuse bladder wall thickening versus normal under distension. Correlate clinically for cystitis. 2. Large amount of stool throughout the colon. Aortic Atherosclerosis (ICD10-I70.0). Electronically Signed   By: Ronney Asters M.D.   On: 12/16/2022 20:26   DG Abd 1 View  Result Date: 12/14/2022 CLINICAL DATA:  Generalized abdominal pain. EXAM: ABDOMEN - 1 VIEW COMPARISON:  10/12/2022 FINDINGS: Bowel gas pattern is nonobstructive. There is significant stool burden throughout nondilated loops of colon. No evidence for organomegaly or free intraperitoneal air. There chronic degenerative changes in the LEFT hip. Remote lumbosacral fusion. IMPRESSION: 1. Nonobstructive bowel gas pattern. 2. Significant stool burden. Electronically Signed   By: Nolon Nations M.D.   On: 12/14/2022 11:40   ECHOCARDIOGRAM COMPLETE  Result Date: 12/12/2022    ECHOCARDIOGRAM REPORT   Patient Name:   JAGAR WALLES Date of Exam: 12/12/2022 Medical Rec #:  [id number]           Height:       68.0 in Accession #:    [id number]          Weight:       156.0 lb Date of Birth:  Jul 22, 1951           BSA:          1.839 m Patient Age:    1 years            BP:           179/101 mmHg Patient Gender: M                   HR:           81 bpm. Exam Location:  Inpatient Procedure: 2D Echo Indications:    chest pain  History:        Patient has prior history of Echocardiogram examinations, most                 recent 09/05/2022.  Cardiomyopathy, CAD, COPD; Risk                 Factors:Diabetes, Hypertension and Dyslipidemia.  Sonographer:    Harvie Junior Referring Phys: [id number] Rhetta Mura  Sonographer Comments: Suboptimal subcostal window. Image acquisition challenging due to COPD. IMPRESSIONS  1. Left ventricular ejection fraction, by estimation, is 55 to 60%. Left  ventricular ejection fraction by 3D volume is 57 %. The left ventricle has normal function. The left ventricle demonstrates regional wall motion abnormalities (see scoring diagram/findings for description). Left ventricular diastolic parameters were normal. There is mild hypokinesis of the left ventricular, basal-mid inferoseptal wall and inferior wall.  2. Right ventricular systolic function is normal. The right ventricular size is normal. There is normal pulmonary artery systolic pressure.  3. The mitral valve is normal in structure. No evidence of mitral valve regurgitation. No evidence of mitral stenosis.  4. The aortic valve is tricuspid. Aortic valve regurgitation is not visualized. No aortic stenosis is present.  5. The inferior vena cava is normal in size with greater than 50% respiratory variability, suggesting right atrial pressure of 3 mmHg. Comparison(s): No significant change from prior study. Prior images reviewed side by side. The left ventricular function is unchanged. The left ventricular wall motion abnormality is unchanged. On review, the inferior/inferoseptal wall motion abnormality  was the same on the 09/05/2022 study. FINDINGS  Left Ventricle: Left ventricular ejection fraction, by estimation, is 55 to 60%. Left ventricular ejection fraction by 3D volume is 57 %. The left ventricle has normal function. The left ventricle demonstrates regional wall motion abnormalities. Mild hypokinesis of the left ventricular, basal-mid inferoseptal wall and inferior wall. The left ventricular internal cavity size was normal in size. There is no left ventricular  hypertrophy. Abnormal (paradoxical) septal motion consistent with post-operative status. Left ventricular diastolic parameters were normal. Normal left ventricular filling pressure.  LV Wall Scoring: The inferior wall, mid inferoseptal segment, and basal inferoseptal segment are hypokinetic. Right Ventricle: The right ventricular size is normal. No increase in right ventricular wall thickness. Right ventricular systolic function is normal. There is normal pulmonary artery systolic pressure. The tricuspid regurgitant velocity is 2.63 m/s, and  with an assumed right atrial pressure of 3 mmHg, the estimated right ventricular systolic pressure is [id number] mmHg. Left Atrium: Left atrial size was normal in size. Right Atrium: Right atrial size was normal in size. Pericardium: There is no evidence of pericardial effusion. Mitral Valve: The mitral valve is normal in structure. No evidence of mitral valve regurgitation. No evidence of mitral valve stenosis. Tricuspid Valve: The tricuspid valve is normal in structure. Tricuspid valve regurgitation is trivial. No evidence of tricuspid stenosis. Aortic Valve: The aortic valve is tricuspid. Aortic valve regurgitation is not visualized. No aortic stenosis is present. Aortic valve mean gradient measures 4.0 mmHg. Aortic valve peak gradient measures 8.5 mmHg. Aortic valve area, by VTI measures 2.39 cm. Pulmonic Valve: The pulmonic valve was normal in structure. Pulmonic valve regurgitation is not visualized. No evidence of pulmonic stenosis. Aorta: The aortic root is normal in size and structure. Venous: The inferior vena cava is normal in size with greater than 50% respiratory variability, suggesting right atrial pressure of 3 mmHg. IAS/Shunts: No atrial level shunt detected by color flow Doppler.  LEFT VENTRICLE PLAX 2D LVIDd:         4.80 cm         Diastology LVIDs:         3.90 cm         LV e' medial:    6.96 cm/s LV PW:         1.05 cm         LV E/e' medial:  10.9 LV IVS:         1.05 cm         LV e' lateral:   8.70  cm/s LVOT diam:     2.00 cm         LV E/e' lateral: 8.7 LV SV:         64 LV SV Index:   35 LVOT Area:     3.14 cm        3D Volume EF                                LV 3D EF:    Left                                             ventricul LV Volumes (MOD)                            ar LV vol d, MOD    72.0 ml                    ejection A2C:                                        fraction LV vol d, MOD    82.5 ml                    by 3D A4C:                                        volume is LV vol s, MOD    26.7 ml                    57 %. A2C: LV vol s, MOD    36.5 ml A4C:                           3D Volume EF: LV SV MOD A2C:   45.3 ml       3D EF:        57 % LV SV MOD A4C:   82.5 ml       LV EDV:       106 ml LV SV MOD BP:    45.0 ml       LV ESV:       46 ml                                LV SV:        60 ml RIGHT VENTRICLE RV Basal diam:  3.70 cm RV Mid diam:    2.70 cm RV S prime:     9.36 cm/s TAPSE (M-mode): 1.6 cm LEFT ATRIUM             Index        RIGHT ATRIUM           Index LA diam:        3.40 cm 1.85 cm/m   RA Area:     13.20 cm LA Vol (A2C):   36.1 ml 19.63 ml/m  RA Volume:   33.00  ml  17.95 ml/m LA Vol (A4C):   43.5 ml 23.66 ml/m LA Biplane Vol: 38.7 ml 21.04 ml/m  AORTIC VALVE                    PULMONIC VALVE AV Area (Vmax):    2.30 cm     PV Vmax:       1.07 m/s AV Area (Vmean):   2.48 cm     PV Peak grad:  4.6 mmHg AV Area (VTI):     2.39 cm AV Vmax:           146.00 cm/s AV Vmean:          88.900 cm/s AV VTI:            0.268 m AV Peak Grad:      8.5 mmHg AV Mean Grad:      4.0 mmHg LVOT Vmax:         107.00 cm/s LVOT Vmean:        70.200 cm/s LVOT VTI:          0.204 m LVOT/AV VTI ratio: 0.76  AORTA Ao Root diam: 3.30 cm MITRAL VALVE               TRICUSPID VALVE MV Area (PHT): 4.39 cm    TR Peak grad:   27.7 mmHg MV Decel Time: 173 msec    TR Vmax:        263.00 cm/s MR Peak grad: 46.8 mmHg MR Vmax:      342.00 cm/s  SHUNTS MV E velocity:  76.00 cm/s  Systemic VTI:  0.20 m MV A velocity: 60.60 cm/s  Systemic Diam: 2.00 cm MV E/A ratio:  1.25 Mihai Croitoru MD Electronically signed by Sanda Klein MD Signature Date/Time: 12/12/2022/4:20:27 PM    Final    MR ANGIO CHEST WO CONTRAST  Result Date: 12/12/2022 CLINICAL DATA:  Acute aortic syndrome suspected EXAM: MRA CHEST WITH OR WITHOUT CONTRAST TECHNIQUE: Angiographic images of the chest were obtained using MRA technique without intravenous contrast. CONTRAST:  None. COMPARISON:  None Available. FINDINGS: VASCULAR Aorta: 2 vessel aortic arch. The right brachiocephalic and left common carotid artery share a common origin. No evidence of aneurysm or dissection. Evaluation is slightly limited by both margin artifact and susceptibility artifact from prior median sternotomy and cerclage wires. Heart: The heart is normal in size. Mild concentric hypertrophy of the left ventricular myocardium. No pericardial effusion. Pulmonary Arteries:  The main pulmonary artery is normal in caliber. Other: None. NON-VASCULAR No significant focal signal abnormality within the chest or visualized upper abdomen. IMPRESSION: 1. No convincing evidence of aortic dissection or other acute aortic abnormality. 2. Concentric hypertrophy of the left ventricular myocardium consistent with the clinical history of hypertension. 3. Surgical changes of prior median sternotomy. Electronically Signed   By: Jacqulynn Cadet M.D.   On: 12/12/2022 13:24   DG Chest 2 View  Result Date: 12/12/2022 CLINICAL DATA:  Suicidal ideation. EXAM: CHEST - 2 VIEW COMPARISON:  AP Lat 11/26/2022 FINDINGS: There are CABG changes, coronary artery stents in left atrial appendage closure device. The mediastinum is normally outlined. There is aortic atherosclerosis. The cardiac size is normal The lungs are clear. There is a partially visible lower cervical ACDF plating. Osteopenia and thoracic spondylosis. Comparison to the prior study reveals no significant  interval change. IMPRESSION: No active cardiopulmonary disease. CABG changes. Aortic atherosclerosis. Electronically Signed   By: Telford Nab M.D.   On: 12/12/2022 00:39  DG Chest 2 View  Result Date: 11/26/2022 CLINICAL DATA:  Chest pain EXAM: CHEST - 2 VIEW COMPARISON:  CXR 11/16/22 FINDINGS: No pleural effusion. No pneumothorax. Status post median sternotomy. Coronary stent in place. Atrial appendage occluded device is unchanged in positioning. Partially visualized cervical spinal fusion hardware in place. Unchanged cardiac and mediastinal contours. No focal airspace opacity. No radiographically apparent displaced rib fractures. Degenerative changes of the bilateral AC joints, right-greater-than-left. Visualized upper abdomen is unremarkable. IMPRESSION: No radiographic finding to explain chest pain. Electronically Signed   By: Marin Roberts M.D.   On: 11/26/2022 13:35    Microbiology: Results for orders placed or performed during the hospital encounter of 11/26/22  SARS Coronavirus 2 by RT PCR (hospital order, performed in [facility] hospital lab) *cepheid single result test* Anterior Nasal Swab     Status: None   Collection Time: 11/27/22 11:06 AM   Specimen: Anterior Nasal Swab  Result Value Ref Range Status   SARS Coronavirus 2 by RT PCR NEGATIVE NEGATIVE Final    Comment: Performed at [facility], [place] [address] [place], [place] [address]    Labs: CBC: Recent Labs  Lab 12/12/22 0019 12/12/22 0744 12/14/22 0423  WBC 5.4 4.7 2.9*  NEUTROABS 4.3 3.2  --   HGB 10.5* 9.0* 7.6*  HCT 33.8* 29.8* 23.7*  MCV 91.6 92.8 89.8  PLT 163 142* [id number]*   Basic Metabolic Panel: Recent Labs  Lab 12/12/22 0019 12/12/22 0744 12/13/22 1434 12/14/22 0423 12/17/22 0703  NA 139 139 138 137 139  K 5.6* 4.9 4.6 4.6 4.9  CL 110 114* 113* 111 111  CO2 16* 16* 20* 20* 21*  GLUCOSE 129* 127* 98 91 93  BUN 88* 85* 66* 61* 49*  CREATININE 4.84* 4.26* 3.45* 3.50* 3.35*  CALCIUM 9.0 8.5*  7.8* 7.5* 8.0*  MG  --  2.3  --   --   --    Liver Function Tests: Recent Labs  Lab 12/12/22 0019 12/12/22 0744  AST 28 24  ALT 31 26  ALKPHOS 81 82  BILITOT 0.8 0.8  PROT 8.4* 7.3  ALBUMIN 5.0 4.3   CBG: Recent Labs  Lab 12/16/22 1134 12/16/22 1600 12/16/22 2118 12/17/22 0750 12/17/22 1137  GLUCAP 110* 160* 173* 82 164*    Discharge time spent: greater than 30 minutes.  Signed: Elmarie Shiley, MD [facility] 12/17/2022

## 2022-12-17 NOTE — Progress Notes (Signed)
Patient has appealed his discharge. His case number is [id number].

## 2022-12-18 LAB — GLUCOSE, CAPILLARY
Glucose-Capillary: 132 mg/dL — ABNORMAL HIGH (ref 70–99)
Glucose-Capillary: 123 mg/dL — ABNORMAL HIGH (ref 70–99)
Glucose-Capillary: 124 mg/dL — ABNORMAL HIGH (ref 70–99)
Glucose-Capillary: 197 mg/dL — ABNORMAL HIGH (ref 70–99)

## 2022-12-18 NOTE — TOC Progression Note (Signed)
Transition of Care [facility]) - Progression Note    Patient Details  Name: Michael [name] MRN: [id number] Date of Birth: 11-01-1950  Transition of Care [facility]) CM/SW [place] Phone Number: 12/18/2022, 12:26 PM     Judithann Graves Appeal Detailed Notice of Discharge letter created and saved: Yes (Christine Christoval)   [facility] ROI Document Created: Yes (christine christoval) [facility] appeal documents uploaded to [facility] stite: Yes Orbie Pyo)

## 2022-12-18 NOTE — Discharge Summary (Signed)
Physician Discharge Summary   Patient: Michael [name] MRN: [id number] DOB: 1951/07/19  Admit date:     12/11/2022  Discharge date: 12/18/22  Discharge Physician: Bonnielee Haff    Recommendations at discharge:   Follow Up with ID for further care of HIV Follow up with nephrology for care renal failure.  Encourage Cocaine cessation    Discharge Diagnoses:   AKI (acute kidney injury) ([place])   Cocaine use disorder ([place])   Essential hypertension   Paroxysmal A-fib ([name])   HIV (human immunodeficiency virus infection) ([place])   COPD (chronic obstructive pulmonary disease) ([place])   Hyperlipidemia with target LDL less than 100   Type II diabetes mellitus with renal manifestations ([name])   GERD (gastroesophageal reflux disease)   S/P CABG (coronary artery bypass graft)   MDD (major depressive disorder), recurrent episode, moderate ([name])   Anemia of chronic disease   Hospital Course: 72 year old with past medical history significant for paroxysmal A-fib, anemia, asthma, CAD, medial femoral condyle chondromalacia, stage IV CKD, collagen vascular disease, history of drug abuse (cocaine over more than 20 years), depression, diabetes type 2, gout, history of HIV, hypertension and PE, recent complaining of worsening chronic chest pain and suicidal ideation. Patient also found to have AKI on CKD.  Evaluated by psych who recommended a routine level of observation.  Assessment and Plan: AKI on CKD stage IV: Metabolic  Acidosis, Hyperkalemia: -Treated  with IV fluids -Strict I's and O's -Received Lokelma, potassium decreased to 4.9 -Started  sodium bicarb, continue.  Renal function improved. Stable.    Precordial chest pain Status post CABG Reports worsening of his chronic chest pain, he has had chest pain since after he had CABG.  Pain is worse on palpation.  Likely musculoskeletal pain. Also chest pain in the setting of cocaine use Troponin not significantly elevated. Continue with  Imdur, tylenol  ECHO; ejection fraction 50%, no significant changes since prior 2D echo MR angio chest: No convincing evidence of aortic dissection or other acute aortic abnormality. Concentric hypertrophy of the left ventricular myocardium consistent with the clinical history of hypertension.  Cocaine use disorder: Counseling provided. Recurrent admission for Same.    Suicidal ideation, MDD: Continue fluoxetine and Seroquel Psych recommend continue observation Cymbalta discontinue by Psych. Patient wasn't clear when this medication was started.  Psych recommend door to door inpatient substance rehab if possible. SW consulted. Patient  was provided information for rehab.    Essential hypertension: Continue with Cardizem   A-fib: continue with Cardizem But not a good candidate for anticoagulation.   HIV: Not currently on therapy Will need follow up with ID   PAD: Continue with bronchodilators as needed   Hyperlipidemia: resume Lipitor.    Diabetes type 2 with renal manifestation SSI   Anemia of chronic disease: Monitor hb.    GERD: Continue with PPI   Abdominal pain, lower quadrant, likely related to constipation; KUB; significant stool burden.  Darted on bowel regimen.  Had good output yesterday.   Patient stable.  He was discharged yesterday but he appealed his discharge.  Waiting on final decision.  Remains stable for discharge.  Denies any complaints this morning.  DISCHARGE MEDICATION: Allergies as of 12/18/2022   No Known Allergies      Medication List     STOP taking these medications    DULoxetine 20 MG capsule Commonly known as: CYMBALTA       TAKE these medications    Accu-Chek Guide test strip Generic drug: glucose blood use  as directed up to four times daily   Accu-Chek Guide w/Device Kit Use as directed.   Accu-Chek Softclix Lancets lancets Use as directed up to four times daily   albuterol 108 (90 Base) MCG/ACT inhaler Commonly known as:  VENTOLIN HFA Inhale 1-2 puffs into the lungs every 6 (six) hours as needed for wheezing or shortness of breath.   atorvastatin 40 MG tablet Commonly known as: LIPITOR Take 1 tablet (40 mg total) by mouth daily.   BD Pen Needle Nano U/F 32G X 4 MM Misc Generic drug: Insulin Pen Needle Use as directed up to 4 times daily.   Breo Ellipta 200-25 MCG/ACT Aepb Generic drug: fluticasone furoate-vilanterol Inhale 1 puff into the lungs daily.   diltiazem 120 MG 24 hr capsule Commonly known as: CARDIZEM CD Take 1 capsule (120 mg total) by mouth daily.   ferrous sulfate 325 (65 FE) MG tablet Take 1 tablet (325 mg total) by mouth daily with breakfast.   FLUoxetine 20 MG capsule Commonly known as: PROZAC Take 1 capsule (20 mg total) by mouth daily.   folic acid 1 MG tablet Commonly known as: FOLVITE Take 1 tablet (1 mg total) by mouth daily.   isosorbide mononitrate 30 MG 24 hr tablet Commonly known as: IMDUR Take 0.5 tablets (15 mg total) by mouth daily.   nitroGLYCERIN 0.4 MG SL tablet Commonly known as: NITROSTAT Place 1 tablet (0.4 mg total) under the tongue every 5 (five) minutes as needed for chest pain.   pantoprazole 40 MG tablet Commonly known as: PROTONIX Take 1 tablet (40 mg total) by mouth daily.   polyethylene glycol 17 g packet Commonly known as: MIRALAX / GLYCOLAX Take 17 g by mouth 2 (two) times daily.   QUEtiapine 50 MG tablet Commonly known as: SEROQUEL Take 1 tablet (50 mg total) by mouth at bedtime.   sodium bicarbonate 650 MG tablet Take 1 tablet (650 mg total) by mouth 3 (three) times daily.        Follow-up Information     [name], MD Follow up in 1 week(s).   Specialty: Internal Medicine Contact information: [place] [address] [phone]                Discharge Exam: Danley Danker Weights   12/16/22 0435 12/17/22 0500 12/18/22 0500  Weight: 70.4 kg 67.6 kg 68.9 kg   General  NAD  Condition at discharge: stable  The results of significant diagnostics from this hospitalization (including imaging, microbiology, ancillary and laboratory) are listed below for reference.   Imaging Studies: CT ABDOMEN PELVIS WO CONTRAST  Result Date: 12/16/2022 CLINICAL DATA:  Acute abdominal pain EXAM: CT ABDOMEN AND PELVIS WITHOUT CONTRAST TECHNIQUE: Multidetector CT imaging of the abdomen and pelvis was performed following the standard protocol without IV contrast. RADIATION DOSE REDUCTION: This exam was performed according to the departmental dose-optimization program which includes automated exposure control, adjustment of the mA and/or kV according to patient size and/or use of iterative reconstruction technique. COMPARISON:  Abdominal x-ray 12/14/2022. CT abdomen and pelvis 11/10/2022. FINDINGS: Lower chest: No acute abnormality. Hepatobiliary: No focal liver abnormality is seen. No gallstones, gallbladder wall thickening, or biliary dilatation. Pancreas: Unremarkable. No pancreatic ductal dilatation or surrounding inflammatory changes. Spleen: Normal in size without focal abnormality. Adrenals/Urinary Tract: There is diffuse bladder wall thickening versus normal under distension. The kidneys and adrenal glands are within normal limits. Stomach/Bowel: Stomach is within normal limits. No evidence of bowel wall thickening, distention, or  inflammatory changes. Appendix is not seen. There is a large amount of stool throughout the colon. Vascular/Lymphatic: Aortic atherosclerosis. No enlarged abdominal or pelvic lymph nodes. Reproductive: Prostate is unremarkable. Other: There is a small fat containing periumbilical hernia. There is no ascites. Musculoskeletal: Multilevel degenerative changes affect the spine. Posterior fusion hardware seen at L5-S1. Sternotomy wires are present. Degenerative changes affect the hips, left greater than right. IMPRESSION: 1. Diffuse bladder wall thickening versus  normal under distension. Correlate clinically for cystitis. 2. Large amount of stool throughout the colon. Aortic Atherosclerosis (ICD10-I70.0). Electronically Signed   By: Ronney Asters M.D.   On: 12/16/2022 20:26   DG Abd 1 View  Result Date: 12/14/2022 CLINICAL DATA:  Generalized abdominal pain. EXAM: ABDOMEN - 1 VIEW COMPARISON:  10/12/2022 FINDINGS: Bowel gas pattern is nonobstructive. There is significant stool burden throughout nondilated loops of colon. No evidence for organomegaly or free intraperitoneal air. There chronic degenerative changes in the LEFT hip. Remote lumbosacral fusion. IMPRESSION: 1. Nonobstructive bowel gas pattern. 2. Significant stool burden. Electronically Signed   By: Nolon Nations M.D.   On: 12/14/2022 11:40   ECHOCARDIOGRAM COMPLETE  Result Date: 12/12/2022    ECHOCARDIOGRAM REPORT   Patient Name:   NERMIN BIELAK Date of Exam: 12/12/2022 Medical Rec #:  [id number]           Height:       68.0 in Accession #:    [id number]          Weight:       156.0 lb Date of Birth:  06/10/1951           BSA:          1.839 m Patient Age:    75 years            BP:           179/101 mmHg Patient Gender: M                   HR:           81 bpm. Exam Location:  Inpatient Procedure: 2D Echo Indications:    chest pain  History:        Patient has prior history of Echocardiogram examinations, most                 recent 09/05/2022. Cardiomyopathy, CAD, COPD; Risk                 Factors:Diabetes, Hypertension and Dyslipidemia.  Sonographer:    Harvie Junior Referring Phys: [id number] Rhetta Mura  Sonographer Comments: Suboptimal subcostal window. Image acquisition challenging due to COPD. IMPRESSIONS  1. Left ventricular ejection fraction, by estimation, is 55 to 60%. Left ventricular ejection fraction by 3D volume is 57 %. The left ventricle has normal function. The left ventricle demonstrates regional wall motion abnormalities (see scoring diagram/findings for description). Left  ventricular diastolic parameters were normal. There is mild hypokinesis of the left ventricular, basal-mid inferoseptal wall and inferior wall.  2. Right ventricular systolic function is normal. The right ventricular size is normal. There is normal pulmonary artery systolic pressure.  3. The mitral valve is normal in structure. No evidence of mitral valve regurgitation. No evidence of mitral stenosis.  4. The aortic valve is tricuspid. Aortic valve regurgitation is not visualized. No aortic stenosis is present.  5. The inferior vena cava is normal in size with greater than 50% respiratory variability, suggesting right atrial pressure of 3 mmHg.  Comparison(s): No significant change from prior study. Prior images reviewed side by side. The left ventricular function is unchanged. The left ventricular wall motion abnormality is unchanged. On review, the inferior/inferoseptal wall motion abnormality  was the same on the 09/05/2022 study. FINDINGS  Left Ventricle: Left ventricular ejection fraction, by estimation, is 55 to 60%. Left ventricular ejection fraction by 3D volume is 57 %. The left ventricle has normal function. The left ventricle demonstrates regional wall motion abnormalities. Mild hypokinesis of the left ventricular, basal-mid inferoseptal wall and inferior wall. The left ventricular internal cavity size was normal in size. There is no left ventricular hypertrophy. Abnormal (paradoxical) septal motion consistent with post-operative status. Left ventricular diastolic parameters were normal. Normal left ventricular filling pressure.  LV Wall Scoring: The inferior wall, mid inferoseptal segment, and basal inferoseptal segment are hypokinetic. Right Ventricle: The right ventricular size is normal. No increase in right ventricular wall thickness. Right ventricular systolic function is normal. There is normal pulmonary artery systolic pressure. The tricuspid regurgitant velocity is 2.63 m/s, and  with an assumed  right atrial pressure of 3 mmHg, the estimated right ventricular systolic pressure is [id number] mmHg. Left Atrium: Left atrial size was normal in size. Right Atrium: Right atrial size was normal in size. Pericardium: There is no evidence of pericardial effusion. Mitral Valve: The mitral valve is normal in structure. No evidence of mitral valve regurgitation. No evidence of mitral valve stenosis. Tricuspid Valve: The tricuspid valve is normal in structure. Tricuspid valve regurgitation is trivial. No evidence of tricuspid stenosis. Aortic Valve: The aortic valve is tricuspid. Aortic valve regurgitation is not visualized. No aortic stenosis is present. Aortic valve mean gradient measures 4.0 mmHg. Aortic valve peak gradient measures 8.5 mmHg. Aortic valve area, by VTI measures 2.39 cm. Pulmonic Valve: The pulmonic valve was normal in structure. Pulmonic valve regurgitation is not visualized. No evidence of pulmonic stenosis. Aorta: The aortic root is normal in size and structure. Venous: The inferior vena cava is normal in size with greater than 50% respiratory variability, suggesting right atrial pressure of 3 mmHg. IAS/Shunts: No atrial level shunt detected by color flow Doppler.  LEFT VENTRICLE PLAX 2D LVIDd:         4.80 cm         Diastology LVIDs:         3.90 cm         LV e' medial:    6.96 cm/s LV PW:         1.05 cm         LV E/e' medial:  10.9 LV IVS:        1.05 cm         LV e' lateral:   8.70 cm/s LVOT diam:     2.00 cm         LV E/e' lateral: 8.7 LV SV:         64 LV SV Index:   35 LVOT Area:     3.14 cm        3D Volume EF                                LV 3D EF:    Left  ventricul LV Volumes (MOD)                            ar LV vol d, MOD    72.0 ml                    ejection A2C:                                        fraction LV vol d, MOD    82.5 ml                    by 3D A4C:                                        volume is LV vol s, MOD    26.7 ml                     57 %. A2C: LV vol s, MOD    36.5 ml A4C:                           3D Volume EF: LV SV MOD A2C:   45.3 ml       3D EF:        57 % LV SV MOD A4C:   82.5 ml       LV EDV:       106 ml LV SV MOD BP:    45.0 ml       LV ESV:       46 ml                                LV SV:        60 ml RIGHT VENTRICLE RV Basal diam:  3.70 cm RV Mid diam:    2.70 cm RV S prime:     9.36 cm/s TAPSE (M-mode): 1.6 cm LEFT ATRIUM             Index        RIGHT ATRIUM           Index LA diam:        3.40 cm 1.85 cm/m   RA Area:     13.20 cm LA Vol (A2C):   36.1 ml 19.63 ml/m  RA Volume:   33.00 ml  17.95 ml/m LA Vol (A4C):   43.5 ml 23.66 ml/m LA Biplane Vol: 38.7 ml 21.04 ml/m  AORTIC VALVE                    PULMONIC VALVE AV Area (Vmax):    2.30 cm     PV Vmax:       1.07 m/s AV Area (Vmean):   2.48 cm     PV Peak grad:  4.6 mmHg AV Area (VTI):     2.39 cm AV Vmax:           146.00 cm/s AV Vmean:          88.900 cm/s AV VTI:            0.268 m AV Peak Grad:  8.5 mmHg AV Mean Grad:      4.0 mmHg LVOT Vmax:         107.00 cm/s LVOT Vmean:        70.200 cm/s LVOT VTI:          0.204 m LVOT/AV VTI ratio: 0.76  AORTA Ao Root diam: 3.30 cm MITRAL VALVE               TRICUSPID VALVE MV Area (PHT): 4.39 cm    TR Peak grad:   27.7 mmHg MV Decel Time: 173 msec    TR Vmax:        263.00 cm/s MR Peak grad: 46.8 mmHg MR Vmax:      342.00 cm/s  SHUNTS MV E velocity: 76.00 cm/s  Systemic VTI:  0.20 m MV A velocity: 60.60 cm/s  Systemic Diam: 2.00 cm MV E/A ratio:  1.25 Mihai Croitoru MD Electronically signed by Sanda Klein MD Signature Date/Time: 12/12/2022/4:20:27 PM    Final    MR ANGIO CHEST WO CONTRAST  Result Date: 12/12/2022 CLINICAL DATA:  Acute aortic syndrome suspected EXAM: MRA CHEST WITH OR WITHOUT CONTRAST TECHNIQUE: Angiographic images of the chest were obtained using MRA technique without intravenous contrast. CONTRAST:  None. COMPARISON:  None Available. FINDINGS: VASCULAR Aorta: 2 vessel aortic arch.  The right brachiocephalic and left common carotid artery share a common origin. No evidence of aneurysm or dissection. Evaluation is slightly limited by both margin artifact and susceptibility artifact from prior median sternotomy and cerclage wires. Heart: The heart is normal in size. Mild concentric hypertrophy of the left ventricular myocardium. No pericardial effusion. Pulmonary Arteries:  The main pulmonary artery is normal in caliber. Other: None. NON-VASCULAR No significant focal signal abnormality within the chest or visualized upper abdomen. IMPRESSION: 1. No convincing evidence of aortic dissection or other acute aortic abnormality. 2. Concentric hypertrophy of the left ventricular myocardium consistent with the clinical history of hypertension. 3. Surgical changes of prior median sternotomy. Electronically Signed   By: Jacqulynn Cadet M.D.   On: 12/12/2022 13:24   DG Chest 2 View  Result Date: 12/12/2022 CLINICAL DATA:  Suicidal ideation. EXAM: CHEST - 2 VIEW COMPARISON:  AP Lat 11/26/2022 FINDINGS: There are CABG changes, coronary artery stents in left atrial appendage closure device. The mediastinum is normally outlined. There is aortic atherosclerosis. The cardiac size is normal The lungs are clear. There is a partially visible lower cervical ACDF plating. Osteopenia and thoracic spondylosis. Comparison to the prior study reveals no significant interval change. IMPRESSION: No active cardiopulmonary disease. CABG changes. Aortic atherosclerosis. Electronically Signed   By: Telford Nab M.D.   On: 12/12/2022 00:39   DG Chest 2 View  Result Date: 11/26/2022 CLINICAL DATA:  Chest pain EXAM: CHEST - 2 VIEW COMPARISON:  CXR 11/16/22 FINDINGS: No pleural effusion. No pneumothorax. Status post median sternotomy. Coronary stent in place. Atrial appendage occluded device is unchanged in positioning. Partially visualized cervical spinal fusion hardware in place. Unchanged cardiac and mediastinal  contours. No focal airspace opacity. No radiographically apparent displaced rib fractures. Degenerative changes of the bilateral AC joints, right-greater-than-left. Visualized upper abdomen is unremarkable. IMPRESSION: No radiographic finding to explain chest pain. Electronically Signed   By: Marin Roberts M.D.   On: 11/26/2022 13:35    Microbiology: Results for orders placed or performed during the hospital encounter of 11/26/22  SARS Coronavirus 2 by RT PCR (hospital order, performed in [facility] hospital lab) *cepheid single result test* Anterior Nasal Swab  Status: None   Collection Time: 11/27/22 11:06 AM   Specimen: Anterior Nasal Swab  Result Value Ref Range Status   SARS Coronavirus 2 by RT PCR NEGATIVE NEGATIVE Final    Comment: Performed at [facility], [place] [address], [place], [place] [address]    Labs: CBC: Recent Labs  Lab 12/12/22 0019 12/12/22 0744 12/14/22 0423  WBC 5.4 4.7 2.9*  NEUTROABS 4.3 3.2  --   HGB 10.5* 9.0* 7.6*  HCT 33.8* 29.8* 23.7*  MCV 91.6 92.8 89.8  PLT 163 142* 114*    Basic Metabolic Panel: Recent Labs  Lab 12/12/22 0019 12/12/22 0744 12/13/22 1434 12/14/22 0423 12/17/22 0703  NA 139 139 138 137 139  K 5.6* 4.9 4.6 4.6 4.9  CL 110 114* 113* 111 111  CO2 16* 16* 20* 20* 21*  GLUCOSE 129* 127* 98 91 93  BUN 88* 85* 66* 61* 49*  CREATININE 4.84* 4.26* 3.45* 3.50* 3.35*  CALCIUM 9.0 8.5* 7.8* 7.5* 8.0*  MG  --  2.3  --   --   --     Liver Function Tests: Recent Labs  Lab 12/12/22 0019 12/12/22 0744  AST 28 24  ALT 31 26  ALKPHOS 81 82  BILITOT 0.8 0.8  PROT 8.4* 7.3  ALBUMIN 5.0 4.3    CBG: Recent Labs  Lab 12/17/22 1137 12/17/22 1604 12/17/22 2008 12/18/22 0737 12/18/22 1134  GLUCAP 164* 97 114* 132* 123*     Discharge time spent: greater than 30 minutes.  Signed: Bonnielee Haff, MD [facility] 12/18/2022

## 2022-12-19 LAB — GLUCOSE, CAPILLARY
Glucose-Capillary: 102 mg/dL — ABNORMAL HIGH (ref 70–99)
Glucose-Capillary: 144 mg/dL — ABNORMAL HIGH (ref 70–99)
Glucose-Capillary: 207 mg/dL — ABNORMAL HIGH (ref 70–99)

## 2022-12-19 NOTE — Discharge Summary (Signed)
Physician Discharge Summary   Patient: Michael [name] MRN: [id number] DOB: 1950/11/28  Admit date:     12/11/2022  Discharge date: 12/19/22  Discharge Physician: Bonnielee Haff    Recommendations at discharge:   Follow Up with ID for further care of HIV Follow up with nephrology for care renal failure.  Encourage Cocaine cessation    Discharge Diagnoses:   AKI (acute kidney injury) ([place])   Cocaine use disorder ([place])   Essential hypertension   Paroxysmal A-fib ([name])   HIV (human immunodeficiency virus infection) ([place])   COPD (chronic obstructive pulmonary disease) ([place])   Hyperlipidemia with target LDL less than 100   Type II diabetes mellitus with renal manifestations ([name])   GERD (gastroesophageal reflux disease)   S/P CABG (coronary artery bypass graft)   MDD (major depressive disorder), recurrent episode, moderate ([name])   Anemia of chronic disease   Hospital Course: 72 year old with past medical history significant for paroxysmal A-fib, anemia, asthma, CAD, medial femoral condyle chondromalacia, stage IV CKD, collagen vascular disease, history of drug abuse (cocaine over more than 20 years), depression, diabetes type 2, gout, history of HIV, hypertension and PE, recent complaining of worsening chronic chest pain and suicidal ideation. Patient also found to have AKI on CKD.  Evaluated by psych who recommended a routine level of observation.  Assessment and Plan: AKI on CKD stage IV: Metabolic  Acidosis, Hyperkalemia: -Treated  with IV fluids -Strict I's and O's -Received Lokelma, potassium decreased to 4.9 -Started sodium bicarb, continue.  Renal function improved and back to baseline.     Precordial chest pain Status post CABG Reports worsening of his chronic chest pain, he has had chest pain since after he had CABG.  Pain is worse on palpation.  Likely musculoskeletal pain. Also chest pain in the setting of cocaine use Troponin not significantly  elevated. Continue with Imdur, tylenol  ECHO; ejection fraction 50%, no significant changes since prior 2D echo MR angio chest: No convincing evidence of aortic dissection or other acute aortic abnormality. Concentric hypertrophy of the left ventricular myocardium consistent with the clinical history of hypertension.  Cocaine use disorder: Counseling provided. Recurrent admission for Same.    Suicidal ideation, MDD: Continue fluoxetine and Seroquel Psych recommend continue observation Cymbalta discontinue by Psych. Patient wasn't clear when this medication was started.  Psych recommend door to door inpatient substance rehab if possible. SW consulted. Patient  was provided information for rehab.    Essential hypertension: Continue with Cardizem   A-fib: continue with Cardizem But not a good candidate for anticoagulation.   HIV: Not currently on therapy Will need follow up with ID   PAD: Continue with bronchodilators as needed   Hyperlipidemia: resume Lipitor.    Diabetes type 2 with renal manifestation SSI   Anemia of chronic disease: Monitor hb.    GERD: Continue with PPI   Abdominal pain, lower quadrant, likely related to constipation; KUB; significant stool burden.  Darted on bowel regimen.  Had good output yesterday.   Patient remains stable.  He was discharged on 3/12 but he appealed his discharge.  Waiting on decision on this appeal.  Remains stable for discharge.   No complaints offered this morning.  DISCHARGE MEDICATION: Allergies as of 12/19/2022   No Known Allergies      Medication List     STOP taking these medications    DULoxetine 20 MG capsule Commonly known as: CYMBALTA       TAKE these medications    Accu-Chek  Guide test strip Generic drug: glucose blood use as directed up to four times daily   Accu-Chek Guide w/Device Kit Use as directed.   Accu-Chek Softclix Lancets lancets Use as directed up to four times daily   albuterol 108 (90  Base) MCG/ACT inhaler Commonly known as: VENTOLIN HFA Inhale 1-2 puffs into the lungs every 6 (six) hours as needed for wheezing or shortness of breath.   atorvastatin 40 MG tablet Commonly known as: LIPITOR Take 1 tablet (40 mg total) by mouth daily.   BD Pen Needle Nano U/F 32G X 4 MM Misc Generic drug: Insulin Pen Needle Use as directed up to 4 times daily.   Breo Ellipta 200-25 MCG/ACT Aepb Generic drug: fluticasone furoate-vilanterol Inhale 1 puff into the lungs daily.   diltiazem 120 MG 24 hr capsule Commonly known as: CARDIZEM CD Take 1 capsule (120 mg total) by mouth daily.   ferrous sulfate 325 (65 FE) MG tablet Take 1 tablet (325 mg total) by mouth daily with breakfast.   FLUoxetine 20 MG capsule Commonly known as: PROZAC Take 1 capsule (20 mg total) by mouth daily.   folic acid 1 MG tablet Commonly known as: FOLVITE Take 1 tablet (1 mg total) by mouth daily.   isosorbide mononitrate 30 MG 24 hr tablet Commonly known as: IMDUR Take 0.5 tablets (15 mg total) by mouth daily.   nitroGLYCERIN 0.4 MG SL tablet Commonly known as: NITROSTAT Place 1 tablet (0.4 mg total) under the tongue every 5 (five) minutes as needed for chest pain.   pantoprazole 40 MG tablet Commonly known as: PROTONIX Take 1 tablet (40 mg total) by mouth daily.   polyethylene glycol 17 g packet Commonly known as: MIRALAX / GLYCOLAX Take 17 g by mouth 2 (two) times daily.   QUEtiapine 50 MG tablet Commonly known as: SEROQUEL Take 1 tablet (50 mg total) by mouth at bedtime.   sodium bicarbonate 650 MG tablet Take 1 tablet (650 mg total) by mouth 3 (three) times daily.        Follow-up Information     [name], MD Follow up in 1 week(s).   Specialty: Internal Medicine Contact information: [place] [address] [phone]                Discharge Exam: Filed Weights   12/17/22 0500 12/18/22 0500 12/19/22 0500  Weight: 67.6 kg  68.9 kg 68.9 kg   General appearance: Awake alert.  In no distress Resp: Clear to auscultation bilaterally.  Normal effort Cardio: S1-S2 is normal regular.  No S3-S4.  No rubs murmurs or bruit GI: Abdomen is soft.  Nontender nondistended.  Bowel sounds are present normal.  No masses organomegaly    Condition at discharge: stable  The results of significant diagnostics from this hospitalization (including imaging, microbiology, ancillary and laboratory) are listed below for reference.   Imaging Studies: CT ABDOMEN PELVIS WO CONTRAST  Result Date: 12/16/2022 CLINICAL DATA:  Acute abdominal pain EXAM: CT ABDOMEN AND PELVIS WITHOUT CONTRAST TECHNIQUE: Multidetector CT imaging of the abdomen and pelvis was performed following the standard protocol without IV contrast. RADIATION DOSE REDUCTION: This exam was performed according to the departmental dose-optimization program which includes automated exposure control, adjustment of the mA and/or kV according to patient size and/or use of iterative reconstruction technique. COMPARISON:  Abdominal x-ray 12/14/2022. CT abdomen and pelvis 11/10/2022. FINDINGS: Lower chest: No acute abnormality. Hepatobiliary: No focal liver abnormality is seen. No gallstones, gallbladder wall thickening,  or biliary dilatation. Pancreas: Unremarkable. No pancreatic ductal dilatation or surrounding inflammatory changes. Spleen: Normal in size without focal abnormality. Adrenals/Urinary Tract: There is diffuse bladder wall thickening versus normal under distension. The kidneys and adrenal glands are within normal limits. Stomach/Bowel: Stomach is within normal limits. No evidence of bowel wall thickening, distention, or inflammatory changes. Appendix is not seen. There is a large amount of stool throughout the colon. Vascular/Lymphatic: Aortic atherosclerosis. No enlarged abdominal or pelvic lymph nodes. Reproductive: Prostate is unremarkable. Other: There is a small fat containing  periumbilical hernia. There is no ascites. Musculoskeletal: Multilevel degenerative changes affect the spine. Posterior fusion hardware seen at L5-S1. Sternotomy wires are present. Degenerative changes affect the hips, left greater than right. IMPRESSION: 1. Diffuse bladder wall thickening versus normal under distension. Correlate clinically for cystitis. 2. Large amount of stool throughout the colon. Aortic Atherosclerosis (ICD10-I70.0). Electronically Signed   By: Ronney Asters M.D.   On: 12/16/2022 20:26   DG Abd 1 View  Result Date: 12/14/2022 CLINICAL DATA:  Generalized abdominal pain. EXAM: ABDOMEN - 1 VIEW COMPARISON:  10/12/2022 FINDINGS: Bowel gas pattern is nonobstructive. There is significant stool burden throughout nondilated loops of colon. No evidence for organomegaly or free intraperitoneal air. There chronic degenerative changes in the LEFT hip. Remote lumbosacral fusion. IMPRESSION: 1. Nonobstructive bowel gas pattern. 2. Significant stool burden. Electronically Signed   By: Nolon Nations M.D.   On: 12/14/2022 11:40   ECHOCARDIOGRAM COMPLETE  Result Date: 12/12/2022    ECHOCARDIOGRAM REPORT   Patient Name:   DARSHAUN HEIBEL Date of Exam: 12/12/2022 Medical Rec #:  [id number]           Height:       68.0 in Accession #:    [id number]          Weight:       156.0 lb Date of Birth:  [birth year]           BSA:          1.839 m Patient Age:    89 years            BP:           179/101 mmHg Patient Gender: M                   HR:           81 bpm. Exam Location:  Inpatient Procedure: 2D Echo Indications:    chest pain  History:        Patient has prior history of Echocardiogram examinations, most                 recent 09/05/2022. Cardiomyopathy, CAD, COPD; Risk                 Factors:Diabetes, Hypertension and Dyslipidemia.  Sonographer:    Harvie Junior Referring Phys: [id number] Rhetta Mura  Sonographer Comments: Suboptimal subcostal window. Image acquisition challenging due to COPD.  IMPRESSIONS  1. Left ventricular ejection fraction, by estimation, is 55 to 60%. Left ventricular ejection fraction by 3D volume is 57 %. The left ventricle has normal function. The left ventricle demonstrates regional wall motion abnormalities (see scoring diagram/findings for description). Left ventricular diastolic parameters were normal. There is mild hypokinesis of the left ventricular, basal-mid inferoseptal wall and inferior wall.  2. Right ventricular systolic function is normal. The right ventricular size is normal. There is normal pulmonary artery systolic pressure.  3. The mitral valve is  normal in structure. No evidence of mitral valve regurgitation. No evidence of mitral stenosis.  4. The aortic valve is tricuspid. Aortic valve regurgitation is not visualized. No aortic stenosis is present.  5. The inferior vena cava is normal in size with greater than 50% respiratory variability, suggesting right atrial pressure of 3 mmHg. Comparison(s): No significant change from prior study. Prior images reviewed side by side. The left ventricular function is unchanged. The left ventricular wall motion abnormality is unchanged. On review, the inferior/inferoseptal wall motion abnormality  was the same on the 09/05/2022 study. FINDINGS  Left Ventricle: Left ventricular ejection fraction, by estimation, is 55 to 60%. Left ventricular ejection fraction by 3D volume is 57 %. The left ventricle has normal function. The left ventricle demonstrates regional wall motion abnormalities. Mild hypokinesis of the left ventricular, basal-mid inferoseptal wall and inferior wall. The left ventricular internal cavity size was normal in size. There is no left ventricular hypertrophy. Abnormal (paradoxical) septal motion consistent with post-operative status. Left ventricular diastolic parameters were normal. Normal left ventricular filling pressure.  LV Wall Scoring: The inferior wall, mid inferoseptal segment, and basal inferoseptal  segment are hypokinetic. Right Ventricle: The right ventricular size is normal. No increase in right ventricular wall thickness. Right ventricular systolic function is normal. There is normal pulmonary artery systolic pressure. The tricuspid regurgitant velocity is 2.63 m/s, and  with an assumed right atrial pressure of 3 mmHg, the estimated right ventricular systolic pressure is [id number] mmHg. Left Atrium: Left atrial size was normal in size. Right Atrium: Right atrial size was normal in size. Pericardium: There is no evidence of pericardial effusion. Mitral Valve: The mitral valve is normal in structure. No evidence of mitral valve regurgitation. No evidence of mitral valve stenosis. Tricuspid Valve: The tricuspid valve is normal in structure. Tricuspid valve regurgitation is trivial. No evidence of tricuspid stenosis. Aortic Valve: The aortic valve is tricuspid. Aortic valve regurgitation is not visualized. No aortic stenosis is present. Aortic valve mean gradient measures 4.0 mmHg. Aortic valve peak gradient measures 8.5 mmHg. Aortic valve area, by VTI measures 2.39 cm. Pulmonic Valve: The pulmonic valve was normal in structure. Pulmonic valve regurgitation is not visualized. No evidence of pulmonic stenosis. Aorta: The aortic root is normal in size and structure. Venous: The inferior vena cava is normal in size with greater than 50% respiratory variability, suggesting right atrial pressure of 3 mmHg. IAS/Shunts: No atrial level shunt detected by color flow Doppler.  LEFT VENTRICLE PLAX 2D LVIDd:         4.80 cm         Diastology LVIDs:         3.90 cm         LV e' medial:    6.96 cm/s LV PW:         1.05 cm         LV E/e' medial:  10.9 LV IVS:        1.05 cm         LV e' lateral:   8.70 cm/s LVOT diam:     2.00 cm         LV E/e' lateral: 8.7 LV SV:         64 LV SV Index:   35 LVOT Area:     3.14 cm        3D Volume EF  LV 3D EF:    Left                                              ventricul LV Volumes (MOD)                            ar LV vol d, MOD    72.0 ml                    ejection A2C:                                        fraction LV vol d, MOD    82.5 ml                    by 3D A4C:                                        volume is LV vol s, MOD    26.7 ml                    57 %. A2C: LV vol s, MOD    36.5 ml A4C:                           3D Volume EF: LV SV MOD A2C:   45.3 ml       3D EF:        57 % LV SV MOD A4C:   82.5 ml       LV EDV:       106 ml LV SV MOD BP:    45.0 ml       LV ESV:       46 ml                                LV SV:        60 ml RIGHT VENTRICLE RV Basal diam:  3.70 cm RV Mid diam:    2.70 cm RV S prime:     9.36 cm/s TAPSE (M-mode): 1.6 cm LEFT ATRIUM             Index        RIGHT ATRIUM           Index LA diam:        3.40 cm 1.85 cm/m   RA Area:     13.20 cm LA Vol (A2C):   36.1 ml 19.63 ml/m  RA Volume:   33.00 ml  17.95 ml/m LA Vol (A4C):   43.5 ml 23.66 ml/m LA Biplane Vol: 38.7 ml 21.04 ml/m  AORTIC VALVE                    PULMONIC VALVE AV Area (Vmax):    2.30 cm     PV Vmax:       1.07 m/s AV Area (Vmean):   2.48 cm     PV Peak grad:  4.6 mmHg AV Area (VTI):  2.39 cm AV Vmax:           146.00 cm/s AV Vmean:          88.900 cm/s AV VTI:            0.268 m AV Peak Grad:      8.5 mmHg AV Mean Grad:      4.0 mmHg LVOT Vmax:         107.00 cm/s LVOT Vmean:        70.200 cm/s LVOT VTI:          0.204 m LVOT/AV VTI ratio: 0.76  AORTA Ao Root diam: 3.30 cm MITRAL VALVE               TRICUSPID VALVE MV Area (PHT): 4.39 cm    TR Peak grad:   27.7 mmHg MV Decel Time: 173 msec    TR Vmax:        263.00 cm/s MR Peak grad: 46.8 mmHg MR Vmax:      342.00 cm/s  SHUNTS MV E velocity: 76.00 cm/s  Systemic VTI:  0.20 m MV A velocity: 60.60 cm/s  Systemic Diam: 2.00 cm MV E/A ratio:  1.25 Mihai Croitoru MD Electronically signed by Sanda Klein MD Signature Date/Time: 12/12/2022/4:20:27 PM    Final    MR ANGIO CHEST WO CONTRAST  Result Date:  12/12/2022 CLINICAL DATA:  Acute aortic syndrome suspected EXAM: MRA CHEST WITH OR WITHOUT CONTRAST TECHNIQUE: Angiographic images of the chest were obtained using MRA technique without intravenous contrast. CONTRAST:  None. COMPARISON:  None Available. FINDINGS: VASCULAR Aorta: 2 vessel aortic arch. The right brachiocephalic and left common carotid artery share a common origin. No evidence of aneurysm or dissection. Evaluation is slightly limited by both margin artifact and susceptibility artifact from prior median sternotomy and cerclage wires. Heart: The heart is normal in size. Mild concentric hypertrophy of the left ventricular myocardium. No pericardial effusion. Pulmonary Arteries:  The main pulmonary artery is normal in caliber. Other: None. NON-VASCULAR No significant focal signal abnormality within the chest or visualized upper abdomen. IMPRESSION: 1. No convincing evidence of aortic dissection or other acute aortic abnormality. 2. Concentric hypertrophy of the left ventricular myocardium consistent with the clinical history of hypertension. 3. Surgical changes of prior median sternotomy. Electronically Signed   By: Jacqulynn Cadet M.D.   On: 12/12/2022 13:24   DG Chest 2 View  Result Date: 12/12/2022 CLINICAL DATA:  Suicidal ideation. EXAM: CHEST - 2 VIEW COMPARISON:  AP Lat 11/26/2022 FINDINGS: There are CABG changes, coronary artery stents in left atrial appendage closure device. The mediastinum is normally outlined. There is aortic atherosclerosis. The cardiac size is normal The lungs are clear. There is a partially visible lower cervical ACDF plating. Osteopenia and thoracic spondylosis. Comparison to the prior study reveals no significant interval change. IMPRESSION: No active cardiopulmonary disease. CABG changes. Aortic atherosclerosis. Electronically Signed   By: Telford Nab M.D.   On: 12/12/2022 00:39   DG Chest 2 View  Result Date: 11/26/2022 CLINICAL DATA:  Chest pain EXAM: CHEST - 2  VIEW COMPARISON:  CXR 11/16/22 FINDINGS: No pleural effusion. No pneumothorax. Status post median sternotomy. Coronary stent in place. Atrial appendage occluded device is unchanged in positioning. Partially visualized cervical spinal fusion hardware in place. Unchanged cardiac and mediastinal contours. No focal airspace opacity. No radiographically apparent displaced rib fractures. Degenerative changes of the bilateral AC joints, right-greater-than-left. Visualized upper abdomen is unremarkable. IMPRESSION: No radiographic finding to explain chest pain.  Electronically Signed   By: Marin Roberts M.D.   On: 11/26/2022 13:35    Microbiology: Results for orders placed or performed during the hospital encounter of 11/26/22  SARS Coronavirus 2 by RT PCR (hospital order, performed in [facility] hospital lab) *cepheid single result test* Anterior Nasal Swab     Status: None   Collection Time: 11/27/22 11:06 AM   Specimen: Anterior Nasal Swab  Result Value Ref Range Status   SARS Coronavirus 2 by RT PCR NEGATIVE NEGATIVE Final    Comment: Performed at [facility], [place] [address], [place], [place] [address]    Labs: CBC: Recent Labs  Lab 12/14/22 0423  WBC 2.9*  HGB 7.6*  HCT 23.7*  MCV 89.8  PLT 114*    Basic Metabolic Panel: Recent Labs  Lab 12/13/22 1434 12/14/22 0423 12/17/22 0703  NA 138 137 139  K 4.6 4.6 4.9  CL 113* 111 111  CO2 20* 20* 21*  GLUCOSE 98 91 93  BUN 66* 61* 49*  CREATININE 3.45* 3.50* 3.35*  CALCIUM 7.8* 7.5* 8.0*     CBG: Recent Labs  Lab 12/18/22 0737 12/18/22 1134 12/18/22 1552 12/18/22 2052 12/19/22 0749  GLUCAP 132* 123* 124* 197* 102*     Discharge time spent: 35 mins.  Signed: Bonnielee Haff, MD [facility] 12/19/2022

## 2022-12-19 NOTE — Progress Notes (Signed)
Mobility Specialist - Progress Note   12/19/22 1005  Mobility  Activity Ambulated with assistance in hallway  Level of Assistance Standby assist, set-up cues, supervision of patient - no hands on  Assistive Device Front wheel walker  Distance Ambulated (ft) 200 ft  Activity Response Tolerated well  Mobility Referral Yes  $Mobility charge 1 Mobility   Pt received in bed and agreeable to mobility. C/o knees feeling "stapled together" rating pain 8/10. Pt to bed after session with all needs met.    [facility]

## 2022-12-20 LAB — GLUCOSE, CAPILLARY
Glucose-Capillary: 93 mg/dL (ref 70–99)
Glucose-Capillary: 128 mg/dL — ABNORMAL HIGH (ref 70–99)
Glucose-Capillary: 122 mg/dL — ABNORMAL HIGH (ref 70–99)
Glucose-Capillary: 173 mg/dL — ABNORMAL HIGH (ref 70–99)

## 2022-12-20 MED ORDER — FLEET ENEMA 7-19 GM/118ML RE ENEM: 1.0000 | ENEMA | Freq: Once | RECTAL | Status: DC

## 2022-12-20 NOTE — Progress Notes (Signed)
Physical Therapy Treatment Patient Details Name: Michael [name] MRN: [id number] DOB: May 04, 1951 Today's Date: 12/20/2022   History of Present Illness 72 yo male admitted with AKI, suicidal ideation, chest pain. Hx of HIV, Afib, anemia, asthma, CAD, MDD, CABG, CKD, drug use, DM, gout, PE    PT Comments     Pt admitted secondary to problem above with deficits below. Patient was mod I PLOF.  Pt currently requires S for gait tasks up to 200 feet with RW noted improved cadence and ongoing shortened stride length. Pt is mod I with bed mobility and transfer tasks. Anticipate patient will benefit from PT to address problems listed below.Will continue to follow acutely to maximize functional mobility independence and safety.     Recommendations for follow up therapy are one component of a multi-disciplinary discharge planning process, led by the attending physician.  Recommendations may be updated based on patient status, additional functional criteria and insurance authorization.  Follow Up Recommendations  No PT follow up     Assistance Recommended at Discharge PRN  Patient can return home with the following Assist for transportation;Help with stairs or ramp for entrance   Equipment Recommendations  None recommended by PT    Recommendations for Other Services       Precautions / Restrictions Precautions Precautions: Fall Restrictions Weight Bearing Restrictions: No     Mobility  Bed Mobility Overal bed mobility: Modified Independent                  Transfers Overall transfer level: Modified independent                 General transfer comment: pt demonstrates good recall for UE and AD placement    Ambulation/Gait Ambulation/Gait assistance: Supervision Gait Distance (Feet): 200 Feet Assistive device: Rolling walker (2 wheels) Gait Pattern/deviations: Decreased step length - left Gait velocity: decreased         Stairs             Wheelchair  Mobility    Modified Rankin (Stroke Patients Only)       Balance Overall balance assessment: Needs assistance, History of Falls         Standing balance support: No upper extremity supported Standing balance-Leahy Scale: Fair Standing balance comment: improved static stnading balance without UE support intermittent periods with limited WB through anterior portion of foot and able to correct with cues                            Cognition Arousal/Alertness: Awake/alert Behavior During Therapy: WFL for tasks assessed/performed Overall Cognitive Status: Within Functional Limits for tasks assessed                                          Exercises General Exercises - Lower Extremity Mini-Sqauts: 5 reps (STS from recliner to RW with B UE support)    General Comments        Pertinent Vitals/Pain Pain Assessment Pain Assessment: 0-10 Pain Score: 7  Faces Pain Scale: Hurts a little bit Pain Location: R shoulder Pain Descriptors / Indicators: Aching, Constant    Home Living Family/patient expects to be discharged to:: Shelter/Homeless                   Additional Comments: Recently was staying with friend but reports that is  no longer option, sometimes stays at hotels, recently went to substance abuse rehab in [place] but reports "they didn't keep me long b/c of my health"    Prior Function            PT Goals (current goals can now be found in the care plan section) Acute Rehab PT Goals Patient Stated Goal: to get better PT Goal Formulation: With patient Time For Goal Achievement: 12/28/22 Potential to Achieve Goals: Good    Frequency    Min 3X/week      PT Plan      Co-evaluation              AM-PAC PT "6 Clicks" Mobility   Outcome Measure  Help needed turning from your back to your side while in a flat bed without using bedrails?: None Help needed moving from lying on your back to sitting on the side of a  flat bed without using bedrails?: None Help needed moving to and from a bed to a chair (including a wheelchair)?: None Help needed standing up from a chair using your arms (e.g., wheelchair or bedside chair)?: None Help needed to walk in hospital room?: A Little Help needed climbing 3-5 steps with a railing? : A Little 6 Click Score: 22    End of Session Equipment Utilized During Treatment: Gait belt Activity Tolerance: Patient tolerated treatment well Patient left: in chair;with call bell/phone within reach;with chair alarm set Nurse Communication: Mobility status PT Visit Diagnosis: History of falling (Z91.81);Muscle weakness (generalized) (M62.81);Repeated falls (R29.6);Difficulty in walking, not elsewhere classified (R26.2);Unsteadiness on feet (R26.81)     Time: [id number] PT Time Calculation (min) (ACUTE ONLY): 26 min  Charges:  $Gait Training: 8-22 mins $Therapeutic Activity: 8-22 mins                    Baird Lyons, PT  Adair Patter 12/20/2022, 11:56 AM

## 2022-12-20 NOTE — Discharge Summary (Signed)
Physician Discharge Summary   Patient: Michael [name] MRN: [id number] DOB: 1951-10-04  Admit date:     12/11/2022  Discharge date: 12/20/22  Discharge Physician: Bonnielee Haff    Recommendations at discharge:   Follow Up with ID for further care of HIV Follow up with nephrology for care renal failure.  Encourage Cocaine cessation    Discharge Diagnoses:   AKI (acute kidney injury) ([place])   Cocaine use disorder ([place])   Essential hypertension   Paroxysmal A-fib ([name])   HIV (human immunodeficiency virus infection) ([place])   COPD (chronic obstructive pulmonary disease) ([place])   Hyperlipidemia with target LDL less than 100   Type II diabetes mellitus with renal manifestations ([name])   GERD (gastroesophageal reflux disease)   S/P CABG (coronary artery bypass graft)   MDD (major depressive disorder), recurrent episode, moderate ([name])   Anemia of chronic disease   Brief HPI: 72 year old with past medical history significant for paroxysmal A-fib, anemia, asthma, CAD, medial femoral condyle chondromalacia, stage IV CKD, collagen vascular disease, history of drug abuse (cocaine over more than 20 years), depression, diabetes type 2, gout, history of HIV, hypertension and PE, recent complaining of worsening chronic chest pain and suicidal ideation. Patient also found to have AKI on CKD.  Evaluated by psych who recommended a routine level of observation.  Assessment and Plan:  AKI on CKD stage IV Metabolic  Acidosis, Hyperkalemia: -Treated  with IV fluids -Strict I's and O's -Received [place], potassium decreased to 4.9 -Started sodium bicarb, continue.  Renal function improved and back to baseline.     Precordial chest pain Status post CABG Reports worsening of his chronic chest pain, he has had chest pain since after he had CABG.  Pain is worse on palpation.  Likely musculoskeletal pain. Also chest pain in the setting of cocaine use Troponin not significantly elevated. Continue  with Imdur, tylenol  ECHO; ejection fraction 50%, no significant changes since prior 2D echo MR angio chest: No convincing evidence of aortic dissection or other acute aortic abnormality. Concentric hypertrophy of the left ventricular myocardium consistent with the clinical history of hypertension.  Cocaine use disorder: Counseling provided. Recurrent admission for Same.    Suicidal ideation, MDD: Continue fluoxetine and Seroquel Psych recommend continue observation Cymbalta discontinue by Psych. Patient wasn't clear when this medication was started.  Psych recommend door to door inpatient substance rehab if possible. SW consulted. Patient  was provided information for rehab.    Essential hypertension: Continue with Cardizem   A-fib: continue with Cardizem But not a good candidate for anticoagulation.   HIV: Not currently on therapy Will need follow up with ID   PAD: Continue with bronchodilators as needed   Hyperlipidemia: resume Lipitor.    Diabetes type 2 with renal manifestation SSI   Anemia of chronic disease: Monitor hb.    GERD: Continue with PPI   Abdominal pain, lower quadrant, likely related to constipation; KUB; significant stool burden.  Started on bowel regimen.  Did have a bowel movement on 3/12.  No further BMs charted. Enema today.  Patient remains stable.  Complaining of abdominal discomfort which is likely due to his constipation.  He has not had a bowel movement in 3 days.  He will be ordered an enema today.  Tolerating his diet without any nausea or vomiting.  Vital signs reviewed and noted to be stable.  Patient has appealed his discharge.  He however remains stable for discharge.  Waiting on decision from his appeal.  DISCHARGE MEDICATION: Allergies as of 12/20/2022   No Known Allergies      Medication List     STOP taking these medications    DULoxetine 20 MG capsule Commonly known as: CYMBALTA       TAKE these medications     Accu-Chek Guide test strip Generic drug: glucose blood use as directed up to four times daily   Accu-Chek Guide w/Device Kit Use as directed.   Accu-Chek Softclix Lancets lancets Use as directed up to four times daily   albuterol 108 (90 Base) MCG/ACT inhaler Commonly known as: VENTOLIN HFA Inhale 1-2 puffs into the lungs every 6 (six) hours as needed for wheezing or shortness of breath.   atorvastatin 40 MG tablet Commonly known as: LIPITOR Take 1 tablet (40 mg total) by mouth daily.   BD Pen Needle Nano U/F 32G X 4 MM Misc Generic drug: Insulin Pen Needle Use as directed up to 4 times daily.   Breo Ellipta 200-25 MCG/ACT Aepb Generic drug: fluticasone furoate-vilanterol Inhale 1 puff into the lungs daily.   diltiazem 120 MG 24 hr capsule Commonly known as: CARDIZEM CD Take 1 capsule (120 mg total) by mouth daily.   ferrous sulfate 325 (65 FE) MG tablet Take 1 tablet (325 mg total) by mouth daily with breakfast.   FLUoxetine 20 MG capsule Commonly known as: PROZAC Take 1 capsule (20 mg total) by mouth daily.   folic acid 1 MG tablet Commonly known as: FOLVITE Take 1 tablet (1 mg total) by mouth daily.   isosorbide mononitrate 30 MG 24 hr tablet Commonly known as: IMDUR Take 0.5 tablets (15 mg total) by mouth daily.   nitroGLYCERIN 0.4 MG SL tablet Commonly known as: NITROSTAT Place 1 tablet (0.4 mg total) under the tongue every 5 (five) minutes as needed for chest pain.   pantoprazole 40 MG tablet Commonly known as: PROTONIX Take 1 tablet (40 mg total) by mouth daily.   polyethylene glycol 17 g packet Commonly known as: MIRALAX / GLYCOLAX Take 17 g by mouth 2 (two) times daily.   QUEtiapine 50 MG tablet Commonly known as: SEROQUEL Take 1 tablet (50 mg total) by mouth at bedtime.   sodium bicarbonate 650 MG tablet Take 1 tablet (650 mg total) by mouth 3 (three) times daily.        Follow-up Information     [name], MD Follow up in 1  week(s).   Specialty: Internal Medicine Contact information: [place] [address] [phone]                Discharge Exam: Filed Weights   12/18/22 0500 12/19/22 0500 12/20/22 0500  Weight: 68.9 kg 68.9 kg 68.2 kg   General appearance: Awake alert.  In no distress Resp: Clear to auscultation bilaterally.  Normal effort Cardio: S1-S2 is normal regular.  No S3-S4.  No rubs murmurs or bruit GI: Abdomen is soft.  Nontender nondistended.  Bowel sounds are present normal.  No masses organomegaly    Condition at discharge: stable  The results of significant diagnostics from this hospitalization (including imaging, microbiology, ancillary and laboratory) are listed below for reference.   Imaging Studies: CT ABDOMEN PELVIS WO CONTRAST  Result Date: 12/16/2022 CLINICAL DATA:  Acute abdominal pain EXAM: CT ABDOMEN AND PELVIS WITHOUT CONTRAST TECHNIQUE: Multidetector CT imaging of the abdomen and pelvis was performed following the standard protocol without IV contrast. RADIATION DOSE REDUCTION: This exam was performed according to the departmental dose-optimization  program which includes automated exposure control, adjustment of the mA and/or kV according to patient size and/or use of iterative reconstruction technique. COMPARISON:  Abdominal x-ray 12/14/2022. CT abdomen and pelvis 11/10/2022. FINDINGS: Lower chest: No acute abnormality. Hepatobiliary: No focal liver abnormality is seen. No gallstones, gallbladder wall thickening, or biliary dilatation. Pancreas: Unremarkable. No pancreatic ductal dilatation or surrounding inflammatory changes. Spleen: Normal in size without focal abnormality. Adrenals/Urinary Tract: There is diffuse bladder wall thickening versus normal under distension. The kidneys and adrenal glands are within normal limits. Stomach/Bowel: Stomach is within normal limits. No evidence of bowel wall thickening, distention, or inflammatory  changes. Appendix is not seen. There is a large amount of stool throughout the colon. Vascular/Lymphatic: Aortic atherosclerosis. No enlarged abdominal or pelvic lymph nodes. Reproductive: Prostate is unremarkable. Other: There is a small fat containing periumbilical hernia. There is no ascites. Musculoskeletal: Multilevel degenerative changes affect the spine. Posterior fusion hardware seen at L5-S1. Sternotomy wires are present. Degenerative changes affect the hips, left greater than right. IMPRESSION: 1. Diffuse bladder wall thickening versus normal under distension. Correlate clinically for cystitis. 2. Large amount of stool throughout the colon. Aortic Atherosclerosis (ICD10-I70.0). Electronically Signed   By: Ronney Asters M.D.   On: 12/16/2022 20:26   DG Abd 1 View  Result Date: 12/14/2022 CLINICAL DATA:  Generalized abdominal pain. EXAM: ABDOMEN - 1 VIEW COMPARISON:  10/12/2022 FINDINGS: Bowel gas pattern is nonobstructive. There is significant stool burden throughout nondilated loops of colon. No evidence for organomegaly or free intraperitoneal air. There chronic degenerative changes in the LEFT hip. Remote lumbosacral fusion. IMPRESSION: 1. Nonobstructive bowel gas pattern. 2. Significant stool burden. Electronically Signed   By: Nolon Nations M.D.   On: 12/14/2022 11:40   ECHOCARDIOGRAM COMPLETE  Result Date: 12/12/2022    ECHOCARDIOGRAM REPORT   Patient Name:   Michael [name] Date of Exam: 12/12/2022 Medical Rec #:  [id number]           Height:       68.0 in Accession #:    [id number]          Weight:       156.0 lb Date of Birth:  January 05, 1951           BSA:          1.839 m Patient Age:    42 years            BP:           179/101 mmHg Patient Gender: M                   HR:           81 bpm. Exam Location:  Inpatient Procedure: 2D Echo Indications:    chest pain  History:        Patient has prior history of Echocardiogram examinations, most                 recent 09/05/2022. Cardiomyopathy,  CAD, COPD; Risk                 Factors:Diabetes, Hypertension and Dyslipidemia.  Sonographer:    Harvie Junior Referring Phys: [id number] Rhetta Mura  Sonographer Comments: Suboptimal subcostal window. Image acquisition challenging due to COPD. IMPRESSIONS  1. Left ventricular ejection fraction, by estimation, is 55 to 60%. Left ventricular ejection fraction by 3D volume is 57 %. The left ventricle has normal function. The left ventricle demonstrates regional wall motion abnormalities (see scoring  diagram/findings for description). Left ventricular diastolic parameters were normal. There is mild hypokinesis of the left ventricular, basal-mid inferoseptal wall and inferior wall.  2. Right ventricular systolic function is normal. The right ventricular size is normal. There is normal pulmonary artery systolic pressure.  3. The mitral valve is normal in structure. No evidence of mitral valve regurgitation. No evidence of mitral stenosis.  4. The aortic valve is tricuspid. Aortic valve regurgitation is not visualized. No aortic stenosis is present.  5. The inferior vena cava is normal in size with greater than 50% respiratory variability, suggesting right atrial pressure of 3 mmHg. Comparison(s): No significant change from prior study. Prior images reviewed side by side. The left ventricular function is unchanged. The left ventricular wall motion abnormality is unchanged. On review, the inferior/inferoseptal wall motion abnormality  was the same on the 09/05/2022 study. FINDINGS  Left Ventricle: Left ventricular ejection fraction, by estimation, is 55 to 60%. Left ventricular ejection fraction by 3D volume is 57 %. The left ventricle has normal function. The left ventricle demonstrates regional wall motion abnormalities. Mild hypokinesis of the left ventricular, basal-mid inferoseptal wall and inferior wall. The left ventricular internal cavity size was normal in size. There is no left ventricular hypertrophy.  Abnormal (paradoxical) septal motion consistent with post-operative status. Left ventricular diastolic parameters were normal. Normal left ventricular filling pressure.  LV Wall Scoring: The inferior wall, mid inferoseptal segment, and basal inferoseptal segment are hypokinetic. Right Ventricle: The right ventricular size is normal. No increase in right ventricular wall thickness. Right ventricular systolic function is normal. There is normal pulmonary artery systolic pressure. The tricuspid regurgitant velocity is 2.63 m/s, and  with an assumed right atrial pressure of 3 mmHg, the estimated right ventricular systolic pressure is [id number] mmHg. Left Atrium: Left atrial size was normal in size. Right Atrium: Right atrial size was normal in size. Pericardium: There is no evidence of pericardial effusion. Mitral Valve: The mitral valve is normal in structure. No evidence of mitral valve regurgitation. No evidence of mitral valve stenosis. Tricuspid Valve: The tricuspid valve is normal in structure. Tricuspid valve regurgitation is trivial. No evidence of tricuspid stenosis. Aortic Valve: The aortic valve is tricuspid. Aortic valve regurgitation is not visualized. No aortic stenosis is present. Aortic valve mean gradient measures 4.0 mmHg. Aortic valve peak gradient measures 8.5 mmHg. Aortic valve area, by VTI measures 2.39 cm. Pulmonic Valve: The pulmonic valve was normal in structure. Pulmonic valve regurgitation is not visualized. No evidence of pulmonic stenosis. Aorta: The aortic root is normal in size and structure. Venous: The inferior vena cava is normal in size with greater than 50% respiratory variability, suggesting right atrial pressure of 3 mmHg. IAS/Shunts: No atrial level shunt detected by color flow Doppler.  LEFT VENTRICLE PLAX 2D LVIDd:         4.80 cm         Diastology LVIDs:         3.90 cm         LV e' medial:    6.96 cm/s LV PW:         1.05 cm         LV E/e' medial:  10.9 LV IVS:        1.05 cm          LV e' lateral:   8.70 cm/s LVOT diam:     2.00 cm         LV E/e' lateral: 8.7 LV SV:  64 LV SV Index:   35 LVOT Area:     3.14 cm        3D Volume EF                                LV 3D EF:    Left                                             ventricul LV Volumes (MOD)                            ar LV vol d, MOD    72.0 ml                    ejection A2C:                                        fraction LV vol d, MOD    82.5 ml                    by 3D A4C:                                        volume is LV vol s, MOD    26.7 ml                    57 %. A2C: LV vol s, MOD    36.5 ml A4C:                           3D Volume EF: LV SV MOD A2C:   45.3 ml       3D EF:        57 % LV SV MOD A4C:   82.5 ml       LV EDV:       106 ml LV SV MOD BP:    45.0 ml       LV ESV:       46 ml                                LV SV:        60 ml RIGHT VENTRICLE RV Basal diam:  3.70 cm RV Mid diam:    2.70 cm RV S prime:     9.36 cm/s TAPSE (M-mode): 1.6 cm LEFT ATRIUM             Index        RIGHT ATRIUM           Index LA diam:        3.40 cm 1.85 cm/m   RA Area:     13.20 cm LA Vol (A2C):   36.1 ml 19.63 ml/m  RA Volume:   33.00 ml  17.95 ml/m LA Vol (A4C):   43.5 ml 23.66 ml/m LA Biplane Vol: 38.7 ml 21.04 ml/m  AORTIC VALVE  PULMONIC VALVE AV Area (Vmax):    2.30 cm     PV Vmax:       1.07 m/s AV Area (Vmean):   2.48 cm     PV Peak grad:  4.6 mmHg AV Area (VTI):     2.39 cm AV Vmax:           146.00 cm/s AV Vmean:          88.900 cm/s AV VTI:            0.268 m AV Peak Grad:      8.5 mmHg AV Mean Grad:      4.0 mmHg LVOT Vmax:         107.00 cm/s LVOT Vmean:        70.200 cm/s LVOT VTI:          0.204 m LVOT/AV VTI ratio: 0.76  AORTA Ao Root diam: 3.30 cm MITRAL VALVE               TRICUSPID VALVE MV Area (PHT): 4.39 cm    TR Peak grad:   27.7 mmHg MV Decel Time: 173 msec    TR Vmax:        263.00 cm/s MR Peak grad: 46.8 mmHg MR Vmax:      342.00 cm/s  SHUNTS MV E velocity: 76.00 cm/s   Systemic VTI:  0.20 m MV A velocity: 60.60 cm/s  Systemic Diam: 2.00 cm MV E/A ratio:  1.25 Mihai Croitoru MD Electronically signed by Sanda Klein MD Signature Date/Time: 12/12/2022/4:20:27 PM    Final    MR ANGIO CHEST WO CONTRAST  Result Date: 12/12/2022 CLINICAL DATA:  Acute aortic syndrome suspected EXAM: MRA CHEST WITH OR WITHOUT CONTRAST TECHNIQUE: Angiographic images of the chest were obtained using MRA technique without intravenous contrast. CONTRAST:  None. COMPARISON:  None Available. FINDINGS: VASCULAR Aorta: 2 vessel aortic arch. The right brachiocephalic and left common carotid artery share a common origin. No evidence of aneurysm or dissection. Evaluation is slightly limited by both margin artifact and susceptibility artifact from prior median sternotomy and cerclage wires. Heart: The heart is normal in size. Mild concentric hypertrophy of the left ventricular myocardium. No pericardial effusion. Pulmonary Arteries:  The main pulmonary artery is normal in caliber. Other: None. NON-VASCULAR No significant focal signal abnormality within the chest or visualized upper abdomen. IMPRESSION: 1. No convincing evidence of aortic dissection or other acute aortic abnormality. 2. Concentric hypertrophy of the left ventricular myocardium consistent with the clinical history of hypertension. 3. Surgical changes of prior median sternotomy. Electronically Signed   By: Jacqulynn Cadet M.D.   On: 12/12/2022 13:24   DG Chest 2 View  Result Date: 12/12/2022 CLINICAL DATA:  Suicidal ideation. EXAM: CHEST - 2 VIEW COMPARISON:  AP Lat 11/26/2022 FINDINGS: There are CABG changes, coronary artery stents in left atrial appendage closure device. The mediastinum is normally outlined. There is aortic atherosclerosis. The cardiac size is normal The lungs are clear. There is a partially visible lower cervical ACDF plating. Osteopenia and thoracic spondylosis. Comparison to the prior study reveals no significant interval  change. IMPRESSION: No active cardiopulmonary disease. CABG changes. Aortic atherosclerosis. Electronically Signed   By: Telford Nab M.D.   On: 12/12/2022 00:39   DG Chest 2 View  Result Date: 11/26/2022 CLINICAL DATA:  Chest pain EXAM: CHEST - 2 VIEW COMPARISON:  CXR 11/16/22 FINDINGS: No pleural effusion. No pneumothorax. Status post median sternotomy. Coronary stent in place. Atrial appendage occluded device  is unchanged in positioning. Partially visualized cervical spinal fusion hardware in place. Unchanged cardiac and mediastinal contours. No focal airspace opacity. No radiographically apparent displaced rib fractures. Degenerative changes of the bilateral AC joints, right-greater-than-left. Visualized upper abdomen is unremarkable. IMPRESSION: No radiographic finding to explain chest pain. Electronically Signed   By: Marin Roberts M.D.   On: 11/26/2022 13:35    Microbiology: Results for orders placed or performed during the hospital encounter of 11/26/22  SARS Coronavirus 2 by RT PCR (hospital order, performed in [facility] hospital lab) *cepheid single result test* Anterior Nasal Swab     Status: None   Collection Time: 11/27/22 11:06 AM   Specimen: Anterior Nasal Swab  Result Value Ref Range Status   SARS Coronavirus 2 by RT PCR NEGATIVE NEGATIVE Final    Comment: Performed at [facility], [place] [address], [place], [place] [address]    Labs: CBC: Recent Labs  Lab 12/14/22 0423  WBC 2.9*  HGB 7.6*  HCT 23.7*  MCV 89.8  PLT 114*    Basic Metabolic Panel: Recent Labs  Lab 12/13/22 1434 12/14/22 0423 12/17/22 0703  NA 138 137 139  K 4.6 4.6 4.9  CL 113* 111 111  CO2 20* 20* 21*  GLUCOSE 98 91 93  BUN 66* 61* 49*  CREATININE 3.45* 3.50* 3.35*  CALCIUM 7.8* 7.5* 8.0*     CBG: Recent Labs  Lab 12/18/22 2052 12/19/22 0749 12/19/22 1650 12/19/22 2128 12/20/22 0744  GLUCAP 197* 102* 144* 207* 93     Discharge time spent: 35 mins.  Signed: Bonnielee Haff, MD [facility] 12/20/2022

## 2022-12-21 LAB — CBC
HCT: 24.3 % — ABNORMAL LOW (ref 39.0–52.0)
Hemoglobin: 7.7 g/dL — ABNORMAL LOW (ref 13.0–17.0)
MCH: 28.3 pg (ref 26.0–34.0)
MCHC: 31.7 g/dL (ref 30.0–36.0)
MCV: 89.3 fL (ref 80.0–100.0)
Platelets: 133 10*3/uL — ABNORMAL LOW (ref 150–400)
RBC: 2.72 MIL/uL — ABNORMAL LOW (ref 4.22–5.81)
RDW: 13.1 % (ref 11.5–15.5)
WBC: 3 10*3/uL — ABNORMAL LOW (ref 4.0–10.5)
nRBC: 0 % (ref 0.0–0.2)

## 2022-12-21 LAB — BASIC METABOLIC PANEL
Anion gap: 7 (ref 5–15)
BUN: 58 mg/dL — ABNORMAL HIGH (ref 8–23)
CO2: 24 mmol/L (ref 22–32)
Calcium: 8.5 mg/dL — ABNORMAL LOW (ref 8.9–10.3)
Chloride: 109 mmol/L (ref 98–111)
Creatinine, Ser: 3.51 mg/dL — ABNORMAL HIGH (ref 0.61–1.24)
GFR, Estimated: 18 mL/min — ABNORMAL LOW (ref >60)
Glucose, Bld: 119 mg/dL — ABNORMAL HIGH (ref 70–99)
Potassium: 4.7 mmol/L (ref 3.5–5.1)
Sodium: 140 mmol/L (ref 135–145)

## 2022-12-21 LAB — GLUCOSE, CAPILLARY
Glucose-Capillary: 98 mg/dL (ref 70–99)
Glucose-Capillary: 167 mg/dL — ABNORMAL HIGH (ref 70–99)
Glucose-Capillary: 194 mg/dL — ABNORMAL HIGH (ref 70–99)
Glucose-Capillary: 170 mg/dL — ABNORMAL HIGH (ref 70–99)

## 2022-12-21 IMAGING — CT CT ABD-PELV W/O CM
2 of 4 series · 16 of 46 positions shown, 18 images · non-contrast
Comparison: 10/08/2021

CLINICAL DATA: Pancreatitis suspected



[Series 3: ap without · axial · non-contrast · 0.72mm/px · z∈[+665,+1050]mm · 13 of 87 slices shown, 15 images]
[im 5/87  soft-tissue]
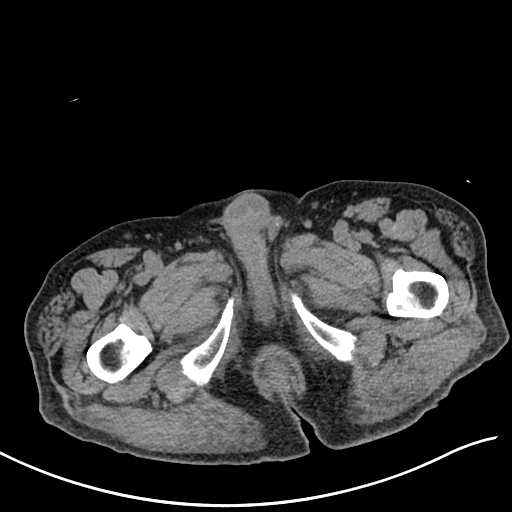
[im 5/87  bone]
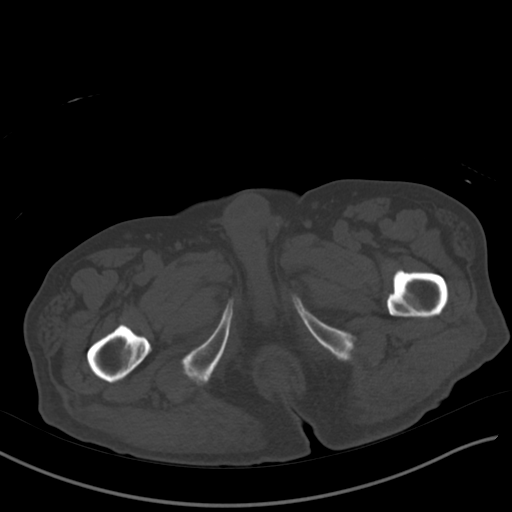
[im 13/87  soft-tissue]
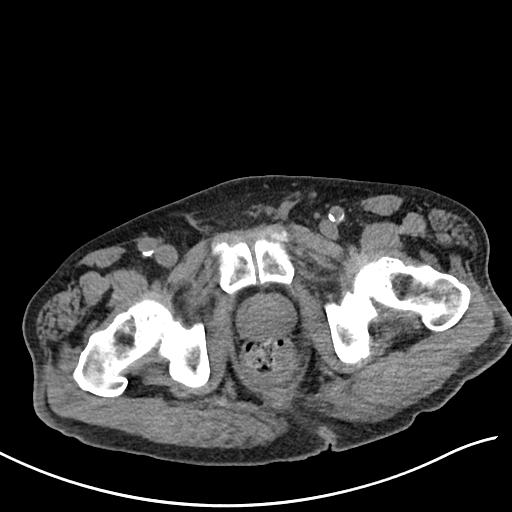
[im 18/87  soft-tissue]
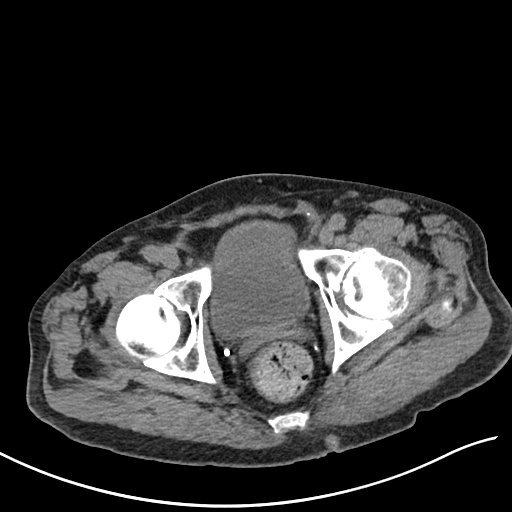
[im 26/87  soft-tissue]
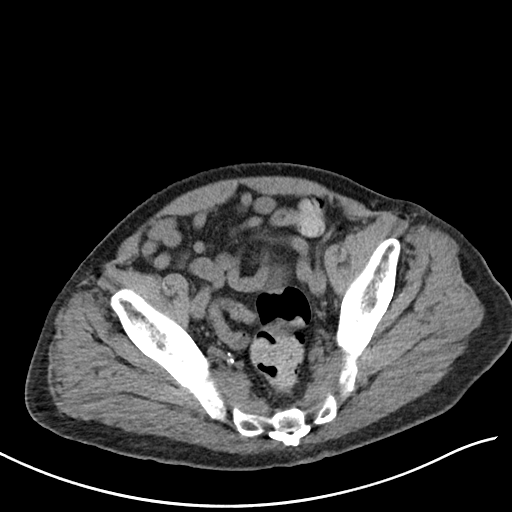
[im 31/87  soft-tissue]
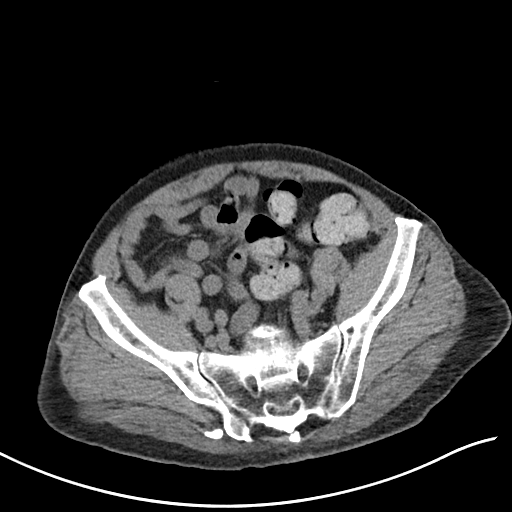
[im 39/87  soft-tissue]
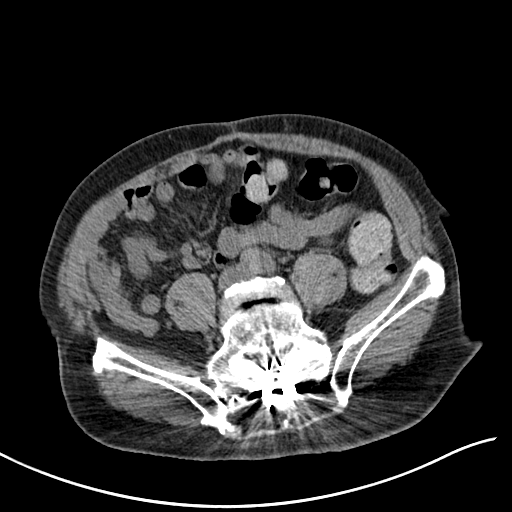
[im 44/87  soft-tissue]
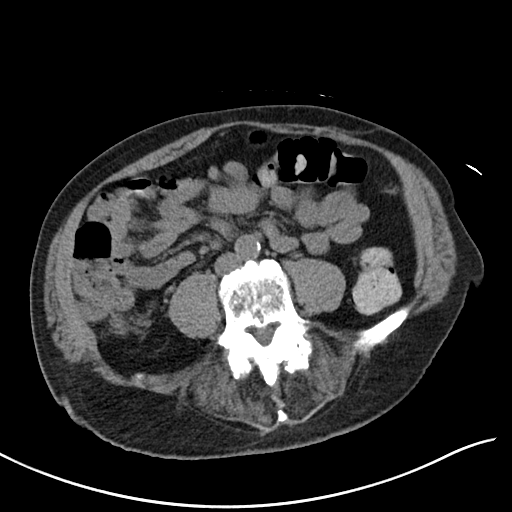
[im 48/87  soft-tissue]
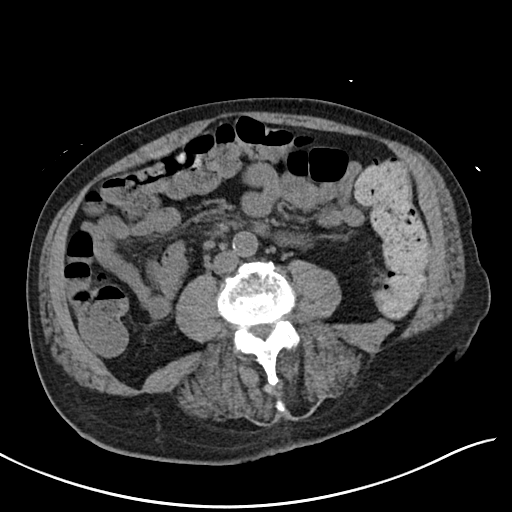
[im 56/87  soft-tissue]
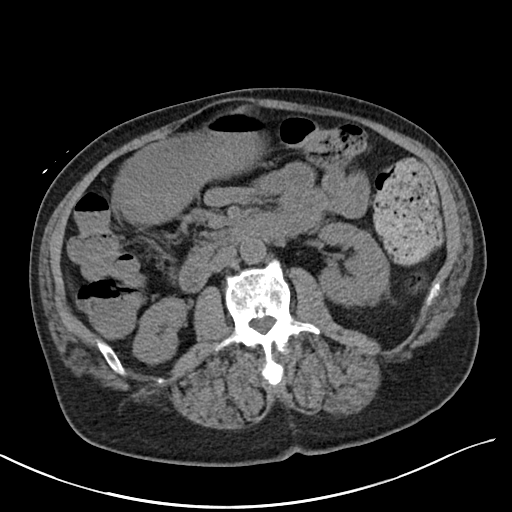
[im 56/87  bone]
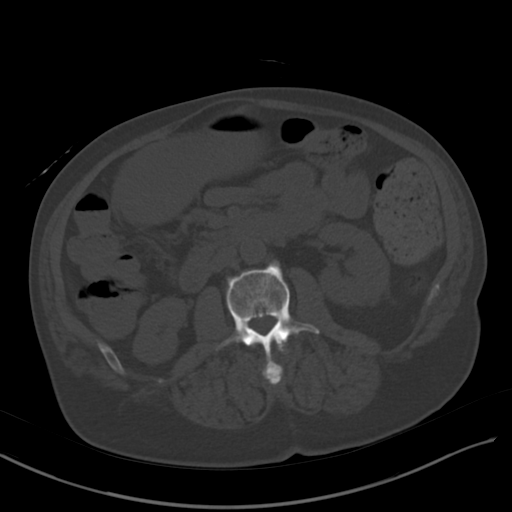
[im 61/87  soft-tissue]
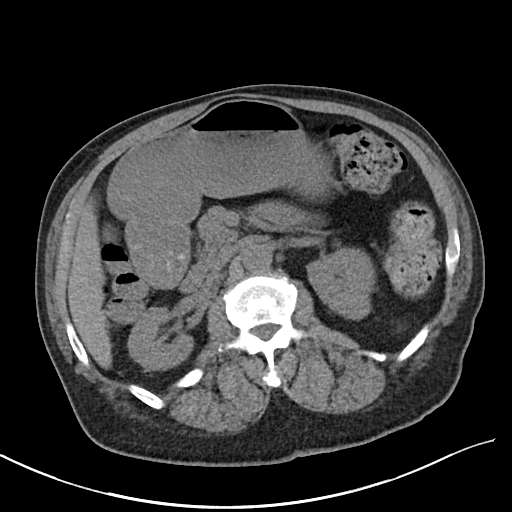
[im 69/87  soft-tissue]
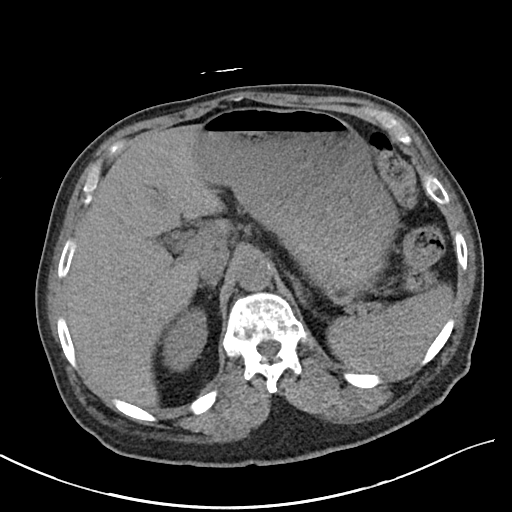
[im 74/87  soft-tissue]
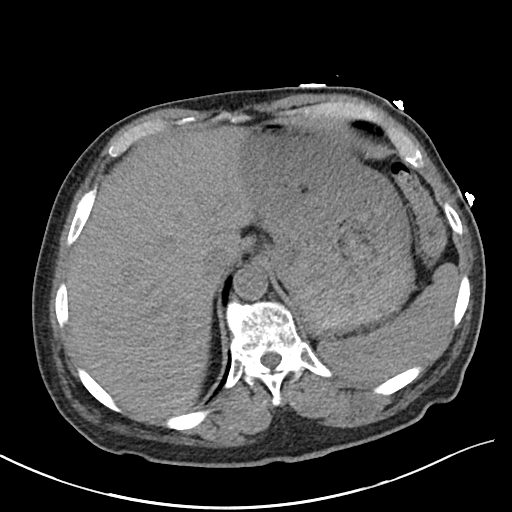
[im 82/87  soft-tissue]
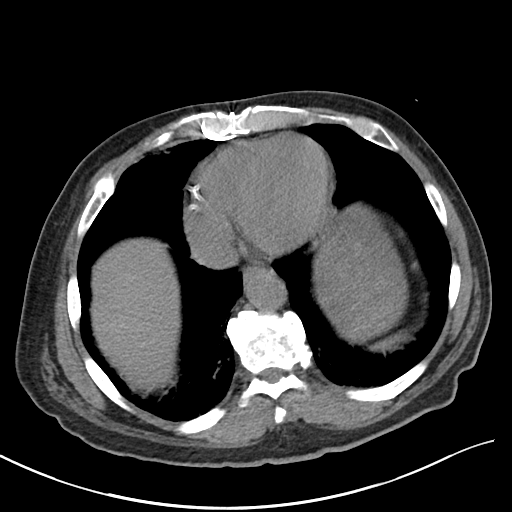

[Series 6: cor · coronal · 0.76mm/px · 3 of 91 slices shown]
[im 31/91  soft-tissue]
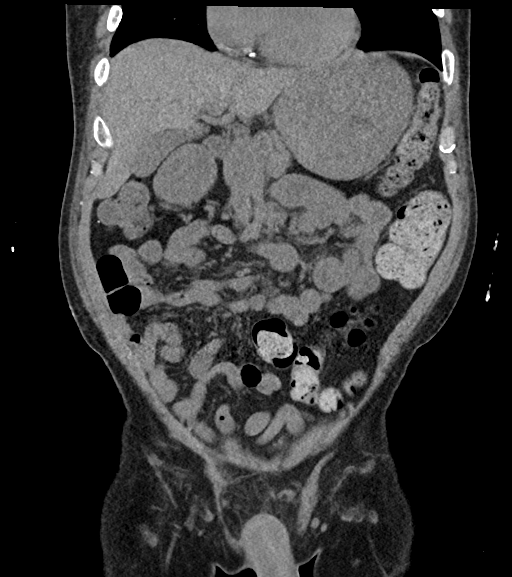
[im 41/91  soft-tissue]
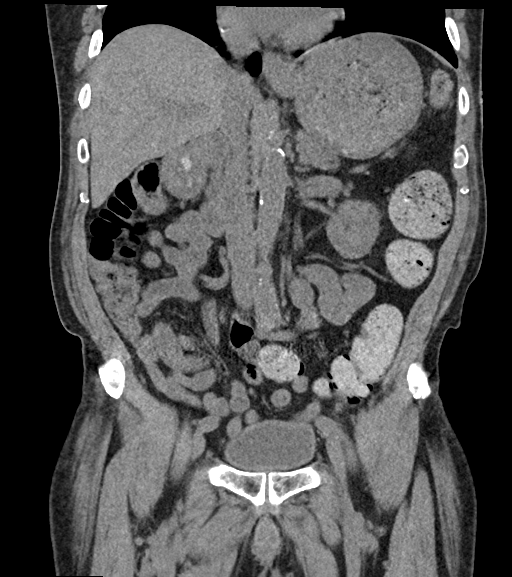
[im 51/91  soft-tissue]
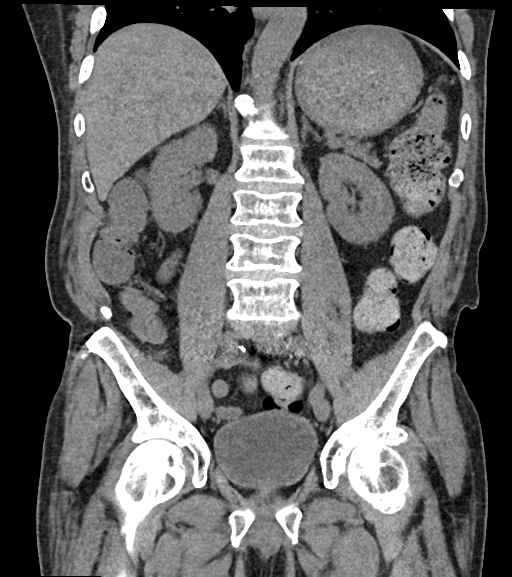

[16 of 46 positions shown; findings below may reference images not displayed]

FINDINGS: Lower chest: Scarring in the lung bases.  No acute abnormality.

Hepatobiliary: No focal hepatic abnormality. Subtle high-density
material layering in the gallbladder could reflect stones or sludge.

Pancreas: No focal abnormality or ductal dilatation.

Spleen: No focal abnormality.  Normal size.

Adrenals/Urinary Tract: No adrenal abnormality. No focal renal
abnormality. No stones or hydronephrosis. Urinary bladder is
unremarkable.

Stomach/Bowel: Moderate stool burden throughout the colon. Stomach,
large and small bowel grossly unremarkable.

Vascular/Lymphatic: Aortic atherosclerosis. No evidence of aneurysm
or adenopathy.

Reproductive: No visible focal abnormality.

Other: No free fluid or free air.

Musculoskeletal: No acute bony abnormality. Postoperative changes in
the lower lumbar spine. Degenerative changes throughout the lumbar
spine.
IMPRESSION: No CT evidence of pancreatitis.

Subtle high-density material layering in the gallbladder could
reflect stones or sludge.

Moderate stool burden in the colon.

## 2022-12-21 IMAGING — CR DG CHEST 2V
2 series · 2 of 2 positions shown · non-contrast
Comparison: 11/03/2021 prior studies

CLINICAL DATA: Chest pain.

EXAM:
CHEST - 2 VIEW

[chest pa]
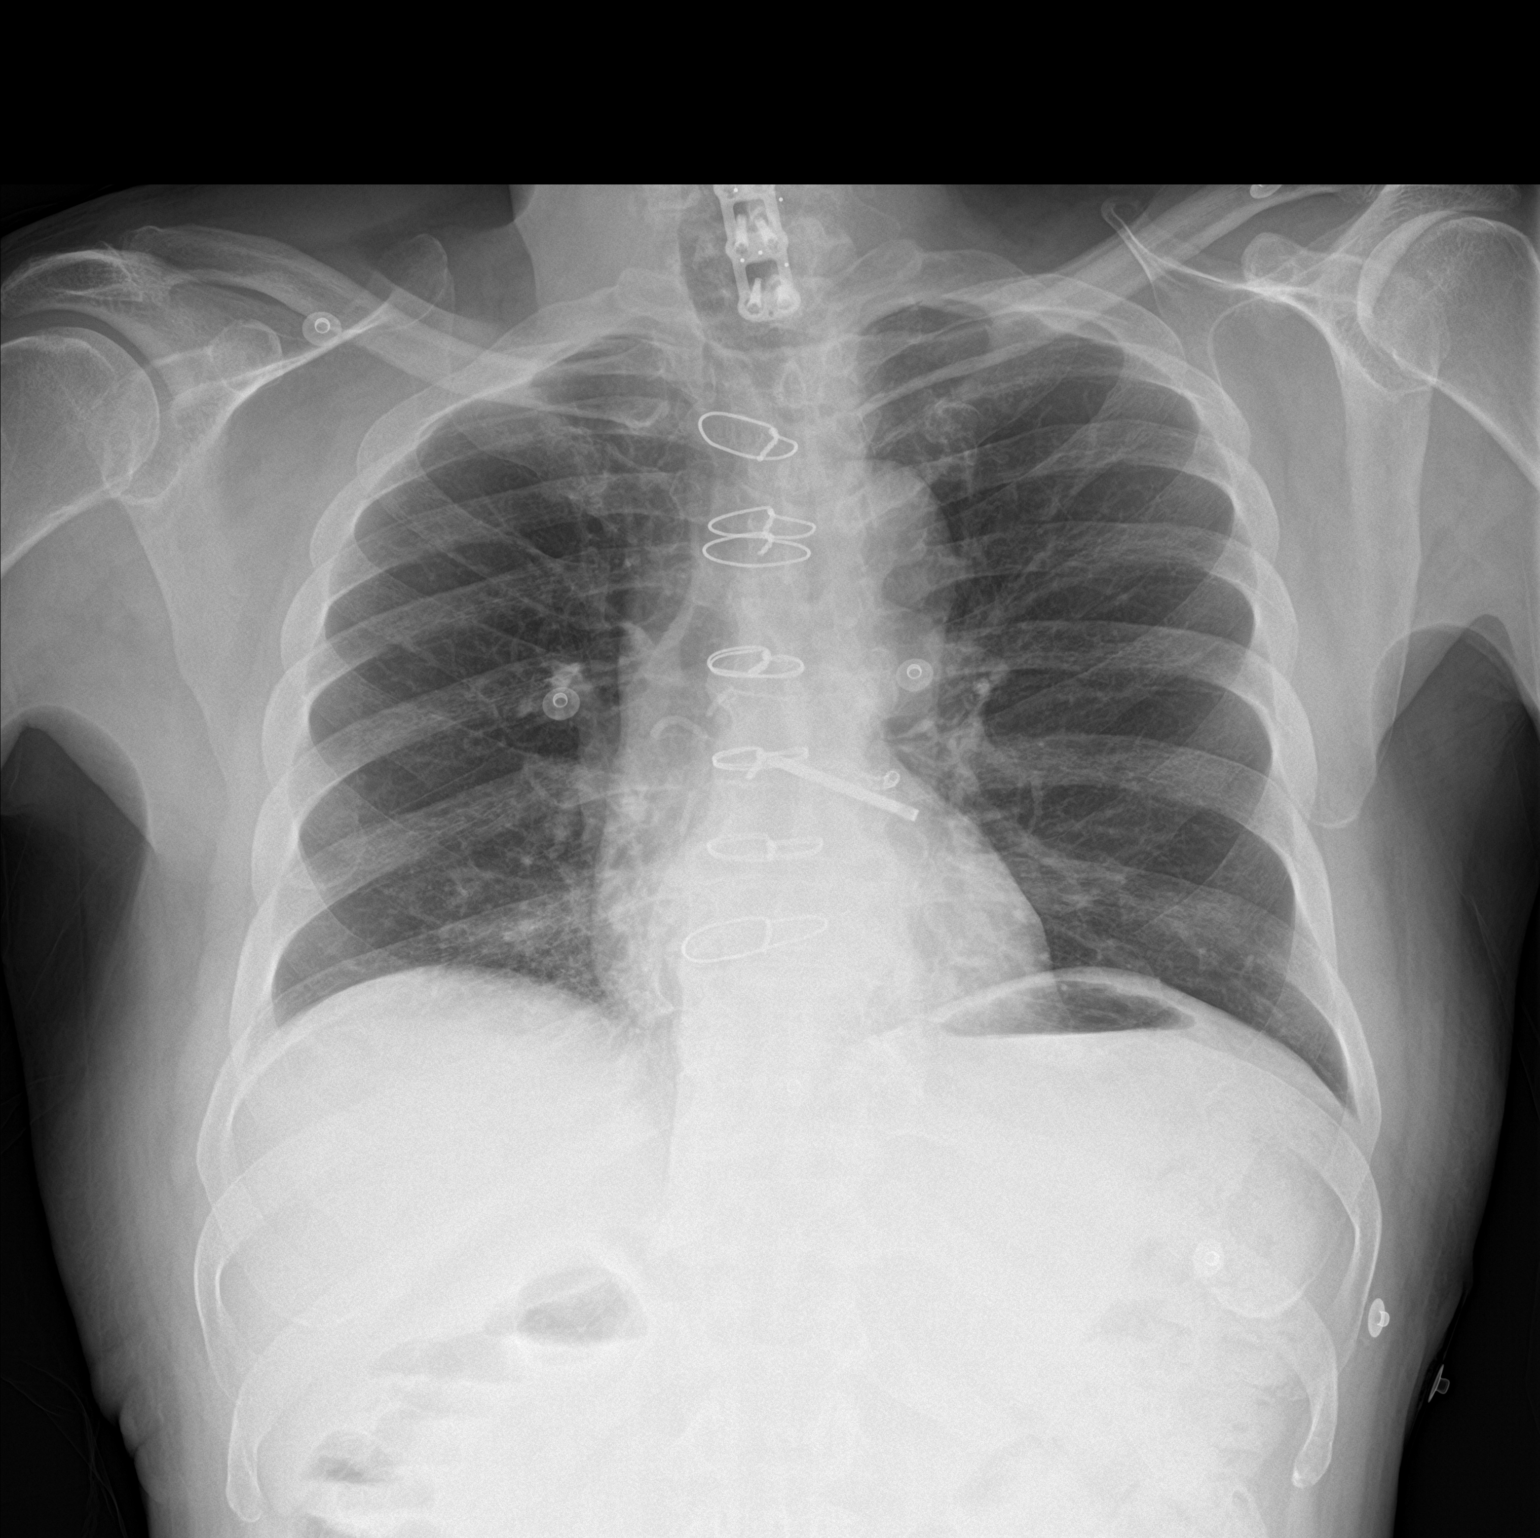

[chest lat]
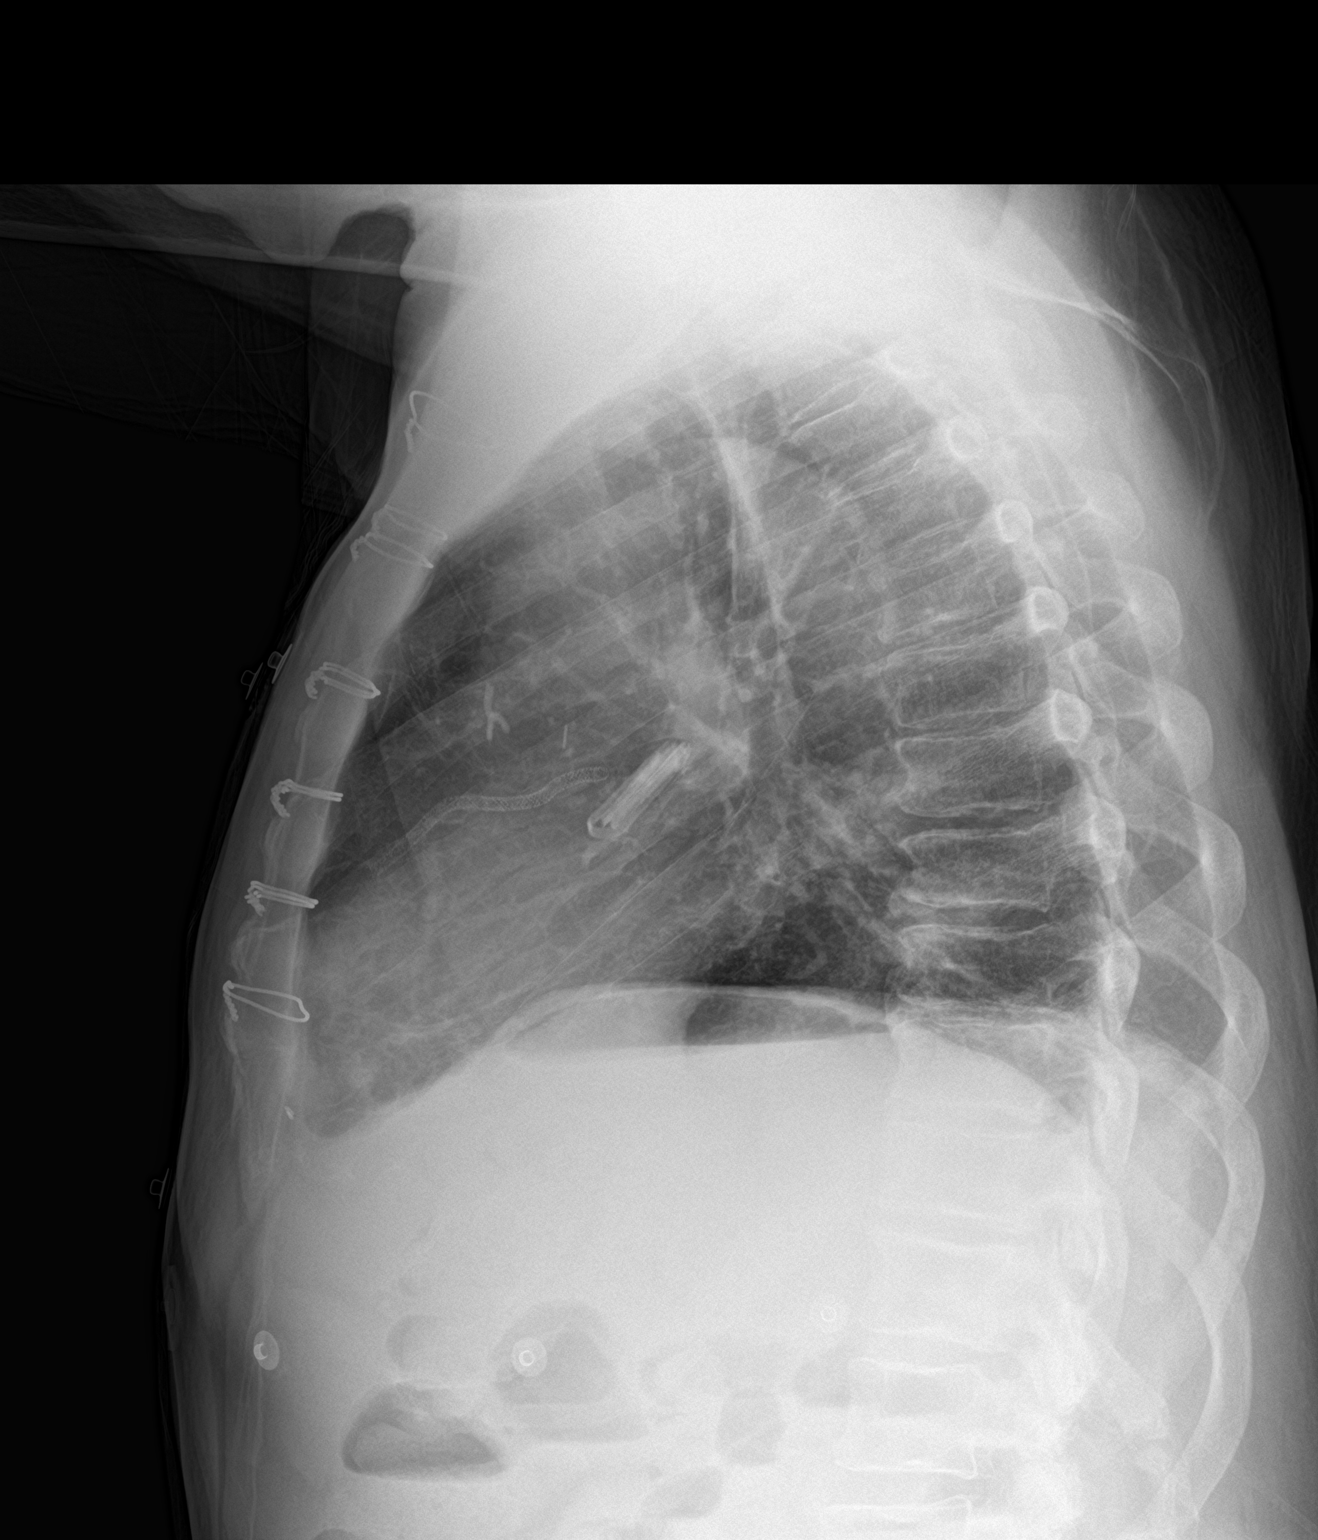

[2 of 2 positions shown; findings below may reference images not displayed]

FINDINGS: Cardiomediastinal silhouette is unchanged.

CABG changes and LEFT atrial clip again noted.

There is no evidence of focal airspace disease, pulmonary edema,
suspicious pulmonary nodule/mass, pleural effusion, or pneumothorax.

No acute bony abnormalities are identified.

Cervical spine fusion hardware again noted.
IMPRESSION: No active cardiopulmonary disease.

## 2022-12-21 NOTE — Discharge Summary (Signed)
Physician Discharge Summary   Patient: Michael [name] MRN: [id number] DOB: 11-May-1951  Admit date:     12/11/2022  Discharge date: 12/21/22  Discharge Physician: Bonnielee Haff    Recommendations at discharge:   Follow Up with ID for further care of HIV Follow up with nephrology for care renal failure.  Encourage Cocaine cessation    Discharge Diagnoses:   AKI (acute kidney injury) ([place])   Cocaine use disorder ([place])   Essential hypertension   Paroxysmal A-fib ([name])   HIV (human immunodeficiency virus infection) ([place])   COPD (chronic obstructive pulmonary disease) ([place])   Hyperlipidemia with target LDL less than 100   Type II diabetes mellitus with renal manifestations ([name])   GERD (gastroesophageal reflux disease)   S/P CABG (coronary artery bypass graft)   MDD (major depressive disorder), recurrent episode, moderate ([name])   Anemia of chronic disease   Brief HPI: 72 year old with past medical history significant for paroxysmal A-fib, anemia, asthma, CAD, medial femoral condyle chondromalacia, stage IV CKD, collagen vascular disease, history of drug abuse (cocaine over more than 20 years), depression, diabetes type 2, gout, history of HIV, hypertension and PE, recent complaining of worsening chronic chest pain and suicidal ideation. Patient also found to have AKI on CKD.  Evaluated by psych who recommended a routine level of observation.  Assessment and Plan:  AKI on CKD stage IV Metabolic  Acidosis, Hyperkalemia: -Treated  with IV fluids -Strict I's and O's -Received Lokelma with improvement in potassium levels. -Started sodium bicarb, continue.  Renal function improved and back to baseline.  Stable this morning.   Precordial chest pain Status post CABG Reports worsening of his chronic chest pain, he has had chest pain since after he had CABG.  Pain is worse on palpation.  Likely musculoskeletal pain. Also chest pain in the setting of cocaine use Troponin not  significantly elevated. Continue with Imdur, tylenol  ECHO; ejection fraction 50%, no significant changes since prior 2D echo MR angio chest: No convincing evidence of aortic dissection or other acute aortic abnormality. Concentric hypertrophy of the left ventricular myocardium consistent with the clinical history of hypertension.  Cocaine use disorder Counseling provided. Recurrent admission for Same.    Suicidal ideation, MDD: Continue fluoxetine and Seroquel Psych recommend continue observation Cymbalta discontinue by Psych. Patient wasn't clear when this medication was started.  Psych recommend door to door inpatient substance rehab if possible. SW consulted. Patient  was provided information for rehab.    Essential hypertension: Continue with Cardizem   A-fib: continue with Cardizem But not a good candidate for anticoagulation.   HIV: Not currently on therapy Will need follow up with ID   PAD: Continue with bronchodilators as needed   Hyperlipidemia: resume Lipitor.    Diabetes type 2 with renal manifestation SSI   Anemia of chronic disease: Monitor hb.    GERD: Continue with PPI   Abdominal pain, lower quadrant, likely related to constipation; KUB; significant stool burden.  Started on bowel regimen.  Did have a bowel movement on 3/12.  No further BMs charted.  Patient mentions that he had a bowel movement yesterday, 3/15.  Patient remains stable.  Blood work this morning shows stability in renal function and hemoglobin.  He did have a bowel movement yesterday.  Vital signs are stable.  Remains stable for discharge.  Patient has appealed his discharge.  We are waiting on the decision.   DISCHARGE MEDICATION: Allergies as of 12/21/2022   No Known Allergies  Medication List     STOP taking these medications    DULoxetine 20 MG capsule Commonly known as: CYMBALTA       TAKE these medications    Accu-Chek Guide test strip Generic drug: glucose  blood use as directed up to four times daily   Accu-Chek Guide w/Device Kit Use as directed.   Accu-Chek Softclix Lancets lancets Use as directed up to four times daily   albuterol 108 (90 Base) MCG/ACT inhaler Commonly known as: VENTOLIN HFA Inhale 1-2 puffs into the lungs every 6 (six) hours as needed for wheezing or shortness of breath.   atorvastatin 40 MG tablet Commonly known as: LIPITOR Take 1 tablet (40 mg total) by mouth daily.   BD Pen Needle Nano U/F 32G X 4 MM Misc Generic drug: Insulin Pen Needle Use as directed up to 4 times daily.   Breo Ellipta 200-25 MCG/ACT Aepb Generic drug: fluticasone furoate-vilanterol Inhale 1 puff into the lungs daily.   diltiazem 120 MG 24 hr capsule Commonly known as: CARDIZEM CD Take 1 capsule (120 mg total) by mouth daily.   ferrous sulfate 325 (65 FE) MG tablet Take 1 tablet (325 mg total) by mouth daily with breakfast.   FLUoxetine 20 MG capsule Commonly known as: PROZAC Take 1 capsule (20 mg total) by mouth daily.   folic acid 1 MG tablet Commonly known as: FOLVITE Take 1 tablet (1 mg total) by mouth daily.   isosorbide mononitrate 30 MG 24 hr tablet Commonly known as: IMDUR Take 0.5 tablets (15 mg total) by mouth daily.   nitroGLYCERIN 0.4 MG SL tablet Commonly known as: NITROSTAT Place 1 tablet (0.4 mg total) under the tongue every 5 (five) minutes as needed for chest pain.   pantoprazole 40 MG tablet Commonly known as: PROTONIX Take 1 tablet (40 mg total) by mouth daily.   polyethylene glycol 17 g packet Commonly known as: MIRALAX / GLYCOLAX Take 17 g by mouth 2 (two) times daily.   QUEtiapine 50 MG tablet Commonly known as: SEROQUEL Take 1 tablet (50 mg total) by mouth at bedtime.   sodium bicarbonate 650 MG tablet Take 1 tablet (650 mg total) by mouth 3 (three) times daily.        Follow-up Information     [name], MD Follow up in 1 week(s).   Specialty: Internal Medicine Contact  information: [place] [address] [phone]                Discharge Exam: Filed Weights   12/18/22 0500 12/19/22 0500 12/20/22 0500  Weight: 68.9 kg 68.9 kg 68.2 kg   Awake alert.  In no distress. Lungs are clear to auscultation bilaterally. S1-2 is normal regular. Abdomen is soft.  Mildly tender in the left lower quadrant without any rebound rigidity or guarding.  Condition at discharge: stable  The results of significant diagnostics from this hospitalization (including imaging, microbiology, ancillary and laboratory) are listed below for reference.   Imaging Studies: CT ABDOMEN PELVIS WO CONTRAST  Result Date: 12/16/2022 CLINICAL DATA:  Acute abdominal pain EXAM: CT ABDOMEN AND PELVIS WITHOUT CONTRAST TECHNIQUE: Multidetector CT imaging of the abdomen and pelvis was performed following the standard protocol without IV contrast. RADIATION DOSE REDUCTION: This exam was performed according to the departmental dose-optimization program which includes automated exposure control, adjustment of the mA and/or kV according to patient size and/or use of iterative reconstruction technique. COMPARISON:  Abdominal x-ray 12/14/2022. CT abdomen and pelvis 11/10/2022.  FINDINGS: Lower chest: No acute abnormality. Hepatobiliary: No focal liver abnormality is seen. No gallstones, gallbladder wall thickening, or biliary dilatation. Pancreas: Unremarkable. No pancreatic ductal dilatation or surrounding inflammatory changes. Spleen: Normal in size without focal abnormality. Adrenals/Urinary Tract: There is diffuse bladder wall thickening versus normal under distension. The kidneys and adrenal glands are within normal limits. Stomach/Bowel: Stomach is within normal limits. No evidence of bowel wall thickening, distention, or inflammatory changes. Appendix is not seen. There is a large amount of stool throughout the colon. Vascular/Lymphatic: Aortic atherosclerosis. No  enlarged abdominal or pelvic lymph nodes. Reproductive: Prostate is unremarkable. Other: There is a small fat containing periumbilical hernia. There is no ascites. Musculoskeletal: Multilevel degenerative changes affect the spine. Posterior fusion hardware seen at L5-S1. Sternotomy wires are present. Degenerative changes affect the hips, left greater than right. IMPRESSION: 1. Diffuse bladder wall thickening versus normal under distension. Correlate clinically for cystitis. 2. Large amount of stool throughout the colon. Aortic Atherosclerosis (ICD10-I70.0). Electronically Signed   By: Ronney Asters M.D.   On: 12/16/2022 20:26   DG Abd 1 View  Result Date: 12/14/2022 CLINICAL DATA:  Generalized abdominal pain. EXAM: ABDOMEN - 1 VIEW COMPARISON:  10/12/2022 FINDINGS: Bowel gas pattern is nonobstructive. There is significant stool burden throughout nondilated loops of colon. No evidence for organomegaly or free intraperitoneal air. There chronic degenerative changes in the LEFT hip. Remote lumbosacral fusion. IMPRESSION: 1. Nonobstructive bowel gas pattern. 2. Significant stool burden. Electronically Signed   By: Nolon Nations M.D.   On: 12/14/2022 11:40   ECHOCARDIOGRAM COMPLETE  Result Date: 12/12/2022    ECHOCARDIOGRAM REPORT   Patient Name:   Michael [name] Date of Exam: 12/12/2022 Medical Rec #:  [id number]           Height:       68.0 in Accession #:    [id number]          Weight:       156.0 lb Date of Birth:  01/28/1951           BSA:          1.839 m Patient Age:    8 years            BP:           179/101 mmHg Patient Gender: M                   HR:           81 bpm. Exam Location:  Inpatient Procedure: 2D Echo Indications:    chest pain  History:        Patient has prior history of Echocardiogram examinations, most                 recent 09/05/2022. Cardiomyopathy, CAD, COPD; Risk                 Factors:Diabetes, Hypertension and Dyslipidemia.  Sonographer:    Harvie Junior Referring Phys:  [id number] Rhetta Mura  Sonographer Comments: Suboptimal subcostal window. Image acquisition challenging due to COPD. IMPRESSIONS  1. Left ventricular ejection fraction, by estimation, is 55 to 60%. Left ventricular ejection fraction by 3D volume is 57 %. The left ventricle has normal function. The left ventricle demonstrates regional wall motion abnormalities (see scoring diagram/findings for description). Left ventricular diastolic parameters were normal. There is mild hypokinesis of the left ventricular, basal-mid inferoseptal wall and inferior wall.  2. Right ventricular systolic function is normal. The  right ventricular size is normal. There is normal pulmonary artery systolic pressure.  3. The mitral valve is normal in structure. No evidence of mitral valve regurgitation. No evidence of mitral stenosis.  4. The aortic valve is tricuspid. Aortic valve regurgitation is not visualized. No aortic stenosis is present.  5. The inferior vena cava is normal in size with greater than 50% respiratory variability, suggesting right atrial pressure of 3 mmHg. Comparison(s): No significant change from prior study. Prior images reviewed side by side. The left ventricular function is unchanged. The left ventricular wall motion abnormality is unchanged. On review, the inferior/inferoseptal wall motion abnormality  was the same on the 09/05/2022 study. FINDINGS  Left Ventricle: Left ventricular ejection fraction, by estimation, is 55 to 60%. Left ventricular ejection fraction by 3D volume is 57 %. The left ventricle has normal function. The left ventricle demonstrates regional wall motion abnormalities. Mild hypokinesis of the left ventricular, basal-mid inferoseptal wall and inferior wall. The left ventricular internal cavity size was normal in size. There is no left ventricular hypertrophy. Abnormal (paradoxical) septal motion consistent with post-operative status. Left ventricular diastolic parameters were normal. Normal  left ventricular filling pressure.  LV Wall Scoring: The inferior wall, mid inferoseptal segment, and basal inferoseptal segment are hypokinetic. Right Ventricle: The right ventricular size is normal. No increase in right ventricular wall thickness. Right ventricular systolic function is normal. There is normal pulmonary artery systolic pressure. The tricuspid regurgitant velocity is 2.63 m/s, and  with an assumed right atrial pressure of 3 mmHg, the estimated right ventricular systolic pressure is [id number] mmHg. Left Atrium: Left atrial size was normal in size. Right Atrium: Right atrial size was normal in size. Pericardium: There is no evidence of pericardial effusion. Mitral Valve: The mitral valve is normal in structure. No evidence of mitral valve regurgitation. No evidence of mitral valve stenosis. Tricuspid Valve: The tricuspid valve is normal in structure. Tricuspid valve regurgitation is trivial. No evidence of tricuspid stenosis. Aortic Valve: The aortic valve is tricuspid. Aortic valve regurgitation is not visualized. No aortic stenosis is present. Aortic valve mean gradient measures 4.0 mmHg. Aortic valve peak gradient measures 8.5 mmHg. Aortic valve area, by VTI measures 2.39 cm. Pulmonic Valve: The pulmonic valve was normal in structure. Pulmonic valve regurgitation is not visualized. No evidence of pulmonic stenosis. Aorta: The aortic root is normal in size and structure. Venous: The inferior vena cava is normal in size with greater than 50% respiratory variability, suggesting right atrial pressure of 3 mmHg. IAS/Shunts: No atrial level shunt detected by color flow Doppler.  LEFT VENTRICLE PLAX 2D LVIDd:         4.80 cm         Diastology LVIDs:         3.90 cm         LV e' medial:    6.96 cm/s LV PW:         1.05 cm         LV E/e' medial:  10.9 LV IVS:        1.05 cm         LV e' lateral:   8.70 cm/s LVOT diam:     2.00 cm         LV E/e' lateral: 8.7 LV SV:         64 LV SV Index:   35 LVOT Area:      3.14 cm        3D Volume EF  LV 3D EF:    Left                                             ventricul LV Volumes (MOD)                            ar LV vol d, MOD    72.0 ml                    ejection A2C:                                        fraction LV vol d, MOD    82.5 ml                    by 3D A4C:                                        volume is LV vol s, MOD    26.7 ml                    57 %. A2C: LV vol s, MOD    36.5 ml A4C:                           3D Volume EF: LV SV MOD A2C:   45.3 ml       3D EF:        57 % LV SV MOD A4C:   82.5 ml       LV EDV:       106 ml LV SV MOD BP:    45.0 ml       LV ESV:       46 ml                                LV SV:        60 ml RIGHT VENTRICLE RV Basal diam:  3.70 cm RV Mid diam:    2.70 cm RV S prime:     9.36 cm/s TAPSE (M-mode): 1.6 cm LEFT ATRIUM             Index        RIGHT ATRIUM           Index LA diam:        3.40 cm 1.85 cm/m   RA Area:     13.20 cm LA Vol (A2C):   36.1 ml 19.63 ml/m  RA Volume:   33.00 ml  17.95 ml/m LA Vol (A4C):   43.5 ml 23.66 ml/m LA Biplane Vol: 38.7 ml 21.04 ml/m  AORTIC VALVE                    PULMONIC VALVE AV Area (Vmax):    2.30 cm     PV Vmax:       1.07 m/s AV Area (Vmean):   2.48 cm     PV Peak grad:  4.6 mmHg AV Area (VTI):  2.39 cm AV Vmax:           146.00 cm/s AV Vmean:          88.900 cm/s AV VTI:            0.268 m AV Peak Grad:      8.5 mmHg AV Mean Grad:      4.0 mmHg LVOT Vmax:         107.00 cm/s LVOT Vmean:        70.200 cm/s LVOT VTI:          0.204 m LVOT/AV VTI ratio: 0.76  AORTA Ao Root diam: 3.30 cm MITRAL VALVE               TRICUSPID VALVE MV Area (PHT): 4.39 cm    TR Peak grad:   27.7 mmHg MV Decel Time: 173 msec    TR Vmax:        263.00 cm/s MR Peak grad: 46.8 mmHg MR Vmax:      342.00 cm/s  SHUNTS MV E velocity: 76.00 cm/s  Systemic VTI:  0.20 m MV A velocity: 60.60 cm/s  Systemic Diam: 2.00 cm MV E/A ratio:  1.25 Mihai Croitoru MD Electronically signed by  Sanda Klein MD Signature Date/Time: 12/12/2022/4:20:27 PM    Final    MR ANGIO CHEST WO CONTRAST  Result Date: 12/12/2022 CLINICAL DATA:  Acute aortic syndrome suspected EXAM: MRA CHEST WITH OR WITHOUT CONTRAST TECHNIQUE: Angiographic images of the chest were obtained using MRA technique without intravenous contrast. CONTRAST:  None. COMPARISON:  None Available. FINDINGS: VASCULAR Aorta: 2 vessel aortic arch. The right brachiocephalic and left common carotid artery share a common origin. No evidence of aneurysm or dissection. Evaluation is slightly limited by both margin artifact and susceptibility artifact from prior median sternotomy and cerclage wires. Heart: The heart is normal in size. Mild concentric hypertrophy of the left ventricular myocardium. No pericardial effusion. Pulmonary Arteries:  The main pulmonary artery is normal in caliber. Other: None. NON-VASCULAR No significant focal signal abnormality within the chest or visualized upper abdomen. IMPRESSION: 1. No convincing evidence of aortic dissection or other acute aortic abnormality. 2. Concentric hypertrophy of the left ventricular myocardium consistent with the clinical history of hypertension. 3. Surgical changes of prior median sternotomy. Electronically Signed   By: Jacqulynn Cadet M.D.   On: 12/12/2022 13:24   DG Chest 2 View  Result Date: 12/12/2022 CLINICAL DATA:  Suicidal ideation. EXAM: CHEST - 2 VIEW COMPARISON:  AP Lat 11/26/2022 FINDINGS: There are CABG changes, coronary artery stents in left atrial appendage closure device. The mediastinum is normally outlined. There is aortic atherosclerosis. The cardiac size is normal The lungs are clear. There is a partially visible lower cervical ACDF plating. Osteopenia and thoracic spondylosis. Comparison to the prior study reveals no significant interval change. IMPRESSION: No active cardiopulmonary disease. CABG changes. Aortic atherosclerosis. Electronically Signed   By: Telford Nab  M.D.   On: 12/12/2022 00:39   DG Chest 2 View  Result Date: 11/26/2022 CLINICAL DATA:  Chest pain EXAM: CHEST - 2 VIEW COMPARISON:  CXR 11/16/22 FINDINGS: No pleural effusion. No pneumothorax. Status post median sternotomy. Coronary stent in place. Atrial appendage occluded device is unchanged in positioning. Partially visualized cervical spinal fusion hardware in place. Unchanged cardiac and mediastinal contours. No focal airspace opacity. No radiographically apparent displaced rib fractures. Degenerative changes of the bilateral AC joints, right-greater-than-left. Visualized upper abdomen is unremarkable. IMPRESSION: No radiographic finding to explain chest pain.  Electronically Signed   By: Marin Roberts M.D.   On: 11/26/2022 13:35    Microbiology: Results for orders placed or performed during the hospital encounter of 11/26/22  SARS Coronavirus 2 by RT PCR (hospital order, performed in [facility] hospital lab) *cepheid single result test* Anterior Nasal Swab     Status: None   Collection Time: 11/27/22 11:06 AM   Specimen: Anterior Nasal Swab  Result Value Ref Range Status   SARS Coronavirus 2 by RT PCR NEGATIVE NEGATIVE Final    Comment: Performed at [facility], [place] [address], [place], [place] [address]    Labs: CBC: Recent Labs  Lab 12/21/22 0438  WBC 3.0*  HGB 7.7*  HCT 24.3*  MCV 89.3  PLT [id number]*   Basic Metabolic Panel: Recent Labs  Lab 12/17/22 0703 12/21/22 0438  NA 139 140  K 4.9 4.7  CL 111 109  CO2 21* 24  GLUCOSE 93 119*  BUN 49* 58*  CREATININE 3.35* 3.51*  CALCIUM 8.0* 8.5*    CBG: Recent Labs  Lab 12/20/22 0744 12/20/22 1156 12/20/22 1644 12/20/22 2122 12/21/22 0801  GLUCAP 93 128* 122* 173* 98    Discharge time spent: 35 mins.  Signed: Bonnielee Haff, MD [facility] 12/21/2022

## 2022-12-22 LAB — GLUCOSE, CAPILLARY
Glucose-Capillary: 99 mg/dL (ref 70–99)
Glucose-Capillary: 150 mg/dL — ABNORMAL HIGH (ref 70–99)
Glucose-Capillary: 170 mg/dL — ABNORMAL HIGH (ref 70–99)
Glucose-Capillary: 206 mg/dL — ABNORMAL HIGH (ref 70–99)

## 2022-12-22 NOTE — Progress Notes (Signed)
Mobility Specialist - Progress Note   12/22/22 1129  Mobility  Activity Ambulated with assistance in hallway;Ambulated with assistance to bathroom;Ambulated with assistance in room  Level of Assistance Standby assist, set-up cues, supervision of patient - no hands on  Assistive Device Front wheel walker  Distance Ambulated (ft) 140 ft  Activity Response Tolerated well  Mobility Referral Yes  $Mobility charge 1 Mobility   Pt received in bed and agreeable to mobility. Prior to ambulating pt requested assistance w/ ordering lunch. Once in hallway, pt c/o knees hurting prompting in shorter ambulation. Upon returning to room, pt requested assistance w/ ALDs which included brushing teeth & washing face. No other complaints during session. Pt to recliner awaiting meal after session with all needs met.      [facility]

## 2022-12-22 NOTE — Discharge Summary (Signed)
Physician Discharge Summary   Patient: Michael [name] MRN: [id number] DOB: Feb 08, 1951  Admit date:     12/11/2022  Discharge date: 12/22/22  Discharge Physician: Bonnielee Haff    Recommendations at discharge:   Follow Up with ID for further care of HIV Follow up with nephrology for care renal failure.  Encourage Cocaine cessation    Discharge Diagnoses:   AKI (acute kidney injury) ([place])   Cocaine use disorder ([place])   Essential hypertension   Paroxysmal A-fib ([name])   HIV (human immunodeficiency virus infection) ([place])   COPD (chronic obstructive pulmonary disease) ([place])   Hyperlipidemia with target LDL less than 100   Type II diabetes mellitus with renal manifestations ([name])   GERD (gastroesophageal reflux disease)   S/P CABG (coronary artery bypass graft)   MDD (major depressive disorder), recurrent episode, moderate ([name])   Anemia of chronic disease   Brief HPI: 72 year old with past medical history significant for paroxysmal A-fib, anemia, asthma, CAD, medial femoral condyle chondromalacia, stage IV CKD, collagen vascular disease, history of drug abuse (cocaine over more than 20 years), depression, diabetes type 2, gout, history of HIV, hypertension and PE, recent complaining of worsening chronic chest pain and suicidal ideation. Patient also found to have AKI on CKD.  Evaluated by psych who recommended a routine level of observation.  Assessment and Plan:  AKI on CKD stage IV Metabolic  Acidosis, Hyperkalemia: -Treated  with IV fluids -Strict I's and O's -Received Lokelma with improvement in potassium levels. -Started sodium bicarb, continue.  Renal function improved and back to baseline.  Stable as of 3/16.   Precordial chest pain Status post CABG Reports worsening of his chronic chest pain, he has had chest pain since after he had CABG.  Pain is worse on palpation.  Likely musculoskeletal pain. Also chest pain in the setting of cocaine use Troponin not  significantly elevated. Continue with Imdur, tylenol  ECHO; ejection fraction 50%, no significant changes since prior 2D echo MR angio chest: No convincing evidence of aortic dissection or other acute aortic abnormality. Concentric hypertrophy of the left ventricular myocardium consistent with the clinical history of hypertension.  Cocaine use disorder Counseling provided. Recurrent admission for Same.    Suicidal ideation, MDD: Continue fluoxetine and Seroquel Psych recommend continue observation Cymbalta discontinue by Psych. Patient wasn't clear when this medication was started.  Psych recommend door to door inpatient substance rehab if possible. SW consulted. Patient  was provided information for rehab.    Essential hypertension: Continue with Cardizem   A-fib: continue with Cardizem But not a good candidate for anticoagulation.   HIV: Not currently on therapy Will need follow up with ID   PAD: Continue with bronchodilators as needed   Hyperlipidemia: resume Lipitor.    Diabetes type 2 with renal manifestation SSI   Anemia of chronic disease: Hemoglobin stable as of 3/16.   GERD: Continue with PPI   Abdominal pain, lower quadrant, likely related to constipation; KUB; significant stool burden.  Started on bowel regimen.  Did have a bowel movement on 3/12.  No further BMs charted.  Patient mentions that he had a bowel movement yesterday, 3/15.  Patient is stable.  Waiting on decision regarding his appeal against discharge.  Remains medically stable for discharge.   DISCHARGE MEDICATION: Allergies as of 12/22/2022   No Known Allergies      Medication List     STOP taking these medications    DULoxetine 20 MG capsule Commonly known as: CYMBALTA  TAKE these medications    Accu-Chek Guide test strip Generic drug: glucose blood use as directed up to four times daily   Accu-Chek Guide w/Device Kit Use as directed.   Accu-Chek Softclix Lancets  lancets Use as directed up to four times daily   albuterol 108 (90 Base) MCG/ACT inhaler Commonly known as: VENTOLIN HFA Inhale 1-2 puffs into the lungs every 6 (six) hours as needed for wheezing or shortness of breath.   atorvastatin 40 MG tablet Commonly known as: LIPITOR Take 1 tablet (40 mg total) by mouth daily.   BD Pen Needle Nano U/F 32G X 4 MM Misc Generic drug: Insulin Pen Needle Use as directed up to 4 times daily.   Breo Ellipta 200-25 MCG/ACT Aepb Generic drug: fluticasone furoate-vilanterol Inhale 1 puff into the lungs daily.   diltiazem 120 MG 24 hr capsule Commonly known as: CARDIZEM CD Take 1 capsule (120 mg total) by mouth daily.   ferrous sulfate 325 (65 FE) MG tablet Take 1 tablet (325 mg total) by mouth daily with breakfast.   FLUoxetine 20 MG capsule Commonly known as: PROZAC Take 1 capsule (20 mg total) by mouth daily.   folic acid 1 MG tablet Commonly known as: FOLVITE Take 1 tablet (1 mg total) by mouth daily.   isosorbide mononitrate 30 MG 24 hr tablet Commonly known as: IMDUR Take 0.5 tablets (15 mg total) by mouth daily.   nitroGLYCERIN 0.4 MG SL tablet Commonly known as: NITROSTAT Place 1 tablet (0.4 mg total) under the tongue every 5 (five) minutes as needed for chest pain.   pantoprazole 40 MG tablet Commonly known as: PROTONIX Take 1 tablet (40 mg total) by mouth daily.   polyethylene glycol 17 g packet Commonly known as: MIRALAX / GLYCOLAX Take 17 g by mouth 2 (two) times daily.   QUEtiapine 50 MG tablet Commonly known as: SEROQUEL Take 1 tablet (50 mg total) by mouth at bedtime.   sodium bicarbonate 650 MG tablet Take 1 tablet (650 mg total) by mouth 3 (three) times daily.        Follow-up Information     [name], MD Follow up in 1 week(s).   Specialty: Internal Medicine Contact information: [place] [address] [phone]                Discharge Exam: Filed  Weights   12/19/22 0500 12/20/22 0500 12/22/22 0500  Weight: 68.9 kg 68.2 kg 64.8 kg   He is a alert.  In no distress.  Condition at discharge: stable  The results of significant diagnostics from this hospitalization (including imaging, microbiology, ancillary and laboratory) are listed below for reference.   Imaging Studies: CT ABDOMEN PELVIS WO CONTRAST  Result Date: 12/16/2022 CLINICAL DATA:  Acute abdominal pain EXAM: CT ABDOMEN AND PELVIS WITHOUT CONTRAST TECHNIQUE: Multidetector CT imaging of the abdomen and pelvis was performed following the standard protocol without IV contrast. RADIATION DOSE REDUCTION: This exam was performed according to the departmental dose-optimization program which includes automated exposure control, adjustment of the mA and/or kV according to patient size and/or use of iterative reconstruction technique. COMPARISON:  Abdominal x-ray 12/14/2022. CT abdomen and pelvis 11/10/2022. FINDINGS: Lower chest: No acute abnormality. Hepatobiliary: No focal liver abnormality is seen. No gallstones, gallbladder wall thickening, or biliary dilatation. Pancreas: Unremarkable. No pancreatic ductal dilatation or surrounding inflammatory changes. Spleen: Normal in size without focal abnormality. Adrenals/Urinary Tract: There is diffuse bladder wall thickening versus normal under distension. The kidneys  and adrenal glands are within normal limits. Stomach/Bowel: Stomach is within normal limits. No evidence of bowel wall thickening, distention, or inflammatory changes. Appendix is not seen. There is a large amount of stool throughout the colon. Vascular/Lymphatic: Aortic atherosclerosis. No enlarged abdominal or pelvic lymph nodes. Reproductive: Prostate is unremarkable. Other: There is a small fat containing periumbilical hernia. There is no ascites. Musculoskeletal: Multilevel degenerative changes affect the spine. Posterior fusion hardware seen at L5-S1. Sternotomy wires are present.  Degenerative changes affect the hips, left greater than right. IMPRESSION: 1. Diffuse bladder wall thickening versus normal under distension. Correlate clinically for cystitis. 2. Large amount of stool throughout the colon. Aortic Atherosclerosis (ICD10-I70.0). Electronically Signed   By: Ronney Asters M.D.   On: 12/16/2022 20:26   DG Abd 1 View  Result Date: 12/14/2022 CLINICAL DATA:  Generalized abdominal pain. EXAM: ABDOMEN - 1 VIEW COMPARISON:  10/12/2022 FINDINGS: Bowel gas pattern is nonobstructive. There is significant stool burden throughout nondilated loops of colon. No evidence for organomegaly or free intraperitoneal air. There chronic degenerative changes in the LEFT hip. Remote lumbosacral fusion. IMPRESSION: 1. Nonobstructive bowel gas pattern. 2. Significant stool burden. Electronically Signed   By: Nolon Nations M.D.   On: 12/14/2022 11:40   ECHOCARDIOGRAM COMPLETE  Result Date: 12/12/2022    ECHOCARDIOGRAM REPORT   Patient Name:   SHEAN DEMOS Date of Exam: 12/12/2022 Medical Rec #:  [id number]           Height:       68.0 in Accession #:    [id number]          Weight:       156.0 lb Date of Birth:  02-Oct-1951           BSA:          1.839 m Patient Age:    1 years            BP:           179/101 mmHg Patient Gender: M                   HR:           81 bpm. Exam Location:  Inpatient Procedure: 2D Echo Indications:    chest pain  History:        Patient has prior history of Echocardiogram examinations, most                 recent 09/05/2022. Cardiomyopathy, CAD, COPD; Risk                 Factors:Diabetes, Hypertension and Dyslipidemia.  Sonographer:    Harvie Junior Referring Phys: [id number] Rhetta Mura  Sonographer Comments: Suboptimal subcostal window. Image acquisition challenging due to COPD. IMPRESSIONS  1. Left ventricular ejection fraction, by estimation, is 55 to 60%. Left ventricular ejection fraction by 3D volume is 57 %. The left ventricle has normal function. The left  ventricle demonstrates regional wall motion abnormalities (see scoring diagram/findings for description). Left ventricular diastolic parameters were normal. There is mild hypokinesis of the left ventricular, basal-mid inferoseptal wall and inferior wall.  2. Right ventricular systolic function is normal. The right ventricular size is normal. There is normal pulmonary artery systolic pressure.  3. The mitral valve is normal in structure. No evidence of mitral valve regurgitation. No evidence of mitral stenosis.  4. The aortic valve is tricuspid. Aortic valve regurgitation is not visualized. No aortic stenosis is present.  5.  The inferior vena cava is normal in size with greater than 50% respiratory variability, suggesting right atrial pressure of 3 mmHg. Comparison(s): No significant change from prior study. Prior images reviewed side by side. The left ventricular function is unchanged. The left ventricular wall motion abnormality is unchanged. On review, the inferior/inferoseptal wall motion abnormality  was the same on the 09/05/2022 study. FINDINGS  Left Ventricle: Left ventricular ejection fraction, by estimation, is 55 to 60%. Left ventricular ejection fraction by 3D volume is 57 %. The left ventricle has normal function. The left ventricle demonstrates regional wall motion abnormalities. Mild hypokinesis of the left ventricular, basal-mid inferoseptal wall and inferior wall. The left ventricular internal cavity size was normal in size. There is no left ventricular hypertrophy. Abnormal (paradoxical) septal motion consistent with post-operative status. Left ventricular diastolic parameters were normal. Normal left ventricular filling pressure.  LV Wall Scoring: The inferior wall, mid inferoseptal segment, and basal inferoseptal segment are hypokinetic. Right Ventricle: The right ventricular size is normal. No increase in right ventricular wall thickness. Right ventricular systolic function is normal. There is  normal pulmonary artery systolic pressure. The tricuspid regurgitant velocity is 2.63 m/s, and  with an assumed right atrial pressure of 3 mmHg, the estimated right ventricular systolic pressure is [id number] mmHg. Left Atrium: Left atrial size was normal in size. Right Atrium: Right atrial size was normal in size. Pericardium: There is no evidence of pericardial effusion. Mitral Valve: The mitral valve is normal in structure. No evidence of mitral valve regurgitation. No evidence of mitral valve stenosis. Tricuspid Valve: The tricuspid valve is normal in structure. Tricuspid valve regurgitation is trivial. No evidence of tricuspid stenosis. Aortic Valve: The aortic valve is tricuspid. Aortic valve regurgitation is not visualized. No aortic stenosis is present. Aortic valve mean gradient measures 4.0 mmHg. Aortic valve peak gradient measures 8.5 mmHg. Aortic valve area, by VTI measures 2.39 cm. Pulmonic Valve: The pulmonic valve was normal in structure. Pulmonic valve regurgitation is not visualized. No evidence of pulmonic stenosis. Aorta: The aortic root is normal in size and structure. Venous: The inferior vena cava is normal in size with greater than 50% respiratory variability, suggesting right atrial pressure of 3 mmHg. IAS/Shunts: No atrial level shunt detected by color flow Doppler.  LEFT VENTRICLE PLAX 2D LVIDd:         4.80 cm         Diastology LVIDs:         3.90 cm         LV e' medial:    6.96 cm/s LV PW:         1.05 cm         LV E/e' medial:  10.9 LV IVS:        1.05 cm         LV e' lateral:   8.70 cm/s LVOT diam:     2.00 cm         LV E/e' lateral: 8.7 LV SV:         64 LV SV Index:   35 LVOT Area:     3.14 cm        3D Volume EF                                LV 3D EF:    Left  ventricul LV Volumes (MOD)                            ar LV vol d, MOD    72.0 ml                    ejection A2C:                                        fraction LV vol d, MOD     82.5 ml                    by 3D A4C:                                        volume is LV vol s, MOD    26.7 ml                    57 %. A2C: LV vol s, MOD    36.5 ml A4C:                           3D Volume EF: LV SV MOD A2C:   45.3 ml       3D EF:        57 % LV SV MOD A4C:   82.5 ml       LV EDV:       106 ml LV SV MOD BP:    45.0 ml       LV ESV:       46 ml                                LV SV:        60 ml RIGHT VENTRICLE RV Basal diam:  3.70 cm RV Mid diam:    2.70 cm RV S prime:     9.36 cm/s TAPSE (M-mode): 1.6 cm LEFT ATRIUM             Index        RIGHT ATRIUM           Index LA diam:        3.40 cm 1.85 cm/m   RA Area:     13.20 cm LA Vol (A2C):   36.1 ml 19.63 ml/m  RA Volume:   33.00 ml  17.95 ml/m LA Vol (A4C):   43.5 ml 23.66 ml/m LA Biplane Vol: 38.7 ml 21.04 ml/m  AORTIC VALVE                    PULMONIC VALVE AV Area (Vmax):    2.30 cm     PV Vmax:       1.07 m/s AV Area (Vmean):   2.48 cm     PV Peak grad:  4.6 mmHg AV Area (VTI):     2.39 cm AV Vmax:           146.00 cm/s AV Vmean:          88.900 cm/s AV VTI:            0.268 m AV Peak Grad:  8.5 mmHg AV Mean Grad:      4.0 mmHg LVOT Vmax:         107.00 cm/s LVOT Vmean:        70.200 cm/s LVOT VTI:          0.204 m LVOT/AV VTI ratio: 0.76  AORTA Ao Root diam: 3.30 cm MITRAL VALVE               TRICUSPID VALVE MV Area (PHT): 4.39 cm    TR Peak grad:   27.7 mmHg MV Decel Time: 173 msec    TR Vmax:        263.00 cm/s MR Peak grad: 46.8 mmHg MR Vmax:      342.00 cm/s  SHUNTS MV E velocity: 76.00 cm/s  Systemic VTI:  0.20 m MV A velocity: 60.60 cm/s  Systemic Diam: 2.00 cm MV E/A ratio:  1.25 Mihai Croitoru MD Electronically signed by Sanda Klein MD Signature Date/Time: 12/12/2022/4:20:27 PM    Final    MR ANGIO CHEST WO CONTRAST  Result Date: 12/12/2022 CLINICAL DATA:  Acute aortic syndrome suspected EXAM: MRA CHEST WITH OR WITHOUT CONTRAST TECHNIQUE: Angiographic images of the chest were obtained using MRA technique without  intravenous contrast. CONTRAST:  None. COMPARISON:  None Available. FINDINGS: VASCULAR Aorta: 2 vessel aortic arch. The right brachiocephalic and left common carotid artery share a common origin. No evidence of aneurysm or dissection. Evaluation is slightly limited by both margin artifact and susceptibility artifact from prior median sternotomy and cerclage wires. Heart: The heart is normal in size. Mild concentric hypertrophy of the left ventricular myocardium. No pericardial effusion. Pulmonary Arteries:  The main pulmonary artery is normal in caliber. Other: None. NON-VASCULAR No significant focal signal abnormality within the chest or visualized upper abdomen. IMPRESSION: 1. No convincing evidence of aortic dissection or other acute aortic abnormality. 2. Concentric hypertrophy of the left ventricular myocardium consistent with the clinical history of hypertension. 3. Surgical changes of prior median sternotomy. Electronically Signed   By: Jacqulynn Cadet M.D.   On: 12/12/2022 13:24   DG Chest 2 View  Result Date: 12/12/2022 CLINICAL DATA:  Suicidal ideation. EXAM: CHEST - 2 VIEW COMPARISON:  AP Lat 11/26/2022 FINDINGS: There are CABG changes, coronary artery stents in left atrial appendage closure device. The mediastinum is normally outlined. There is aortic atherosclerosis. The cardiac size is normal The lungs are clear. There is a partially visible lower cervical ACDF plating. Osteopenia and thoracic spondylosis. Comparison to the prior study reveals no significant interval change. IMPRESSION: No active cardiopulmonary disease. CABG changes. Aortic atherosclerosis. Electronically Signed   By: Telford Nab M.D.   On: 12/12/2022 00:39   DG Chest 2 View  Result Date: 11/26/2022 CLINICAL DATA:  Chest pain EXAM: CHEST - 2 VIEW COMPARISON:  CXR 11/16/22 FINDINGS: No pleural effusion. No pneumothorax. Status post median sternotomy. Coronary stent in place. Atrial appendage occluded device is unchanged in  positioning. Partially visualized cervical spinal fusion hardware in place. Unchanged cardiac and mediastinal contours. No focal airspace opacity. No radiographically apparent displaced rib fractures. Degenerative changes of the bilateral AC joints, right-greater-than-left. Visualized upper abdomen is unremarkable. IMPRESSION: No radiographic finding to explain chest pain. Electronically Signed   By: Marin Roberts M.D.   On: 11/26/2022 13:35    Microbiology: Results for orders placed or performed during the hospital encounter of 11/26/22  SARS Coronavirus 2 by RT PCR (hospital order, performed in [facility] hospital lab) *cepheid single result test* Anterior Nasal Swab  Status: None   Collection Time: 11/27/22 11:06 AM   Specimen: Anterior Nasal Swab  Result Value Ref Range Status   SARS Coronavirus 2 by RT PCR NEGATIVE NEGATIVE Final    Comment: Performed at [facility], [place] [address] [place], [place] [address]    Labs: CBC: Recent Labs  Lab 12/21/22 0438  WBC 3.0*  HGB 7.7*  HCT 24.3*  MCV 89.3  PLT 133*    Basic Metabolic Panel: Recent Labs  Lab 12/17/22 0703 12/21/22 0438  NA 139 140  K 4.9 4.7  CL 111 109  CO2 21* 24  GLUCOSE 93 119*  BUN 49* 58*  CREATININE 3.35* 3.51*  CALCIUM 8.0* 8.5*     CBG: Recent Labs  Lab 12/21/22 0801 12/21/22 1153 12/21/22 1700 12/21/22 2108 12/22/22 0733  GLUCAP 98 167* 194* 170* 99     Discharge time spent: 35 mins.  Signed: Bonnielee Haff, MD [facility] 12/22/2022

## 2022-12-23 LAB — GLUCOSE, CAPILLARY
Glucose-Capillary: 106 mg/dL — ABNORMAL HIGH (ref 70–99)
Glucose-Capillary: 214 mg/dL — ABNORMAL HIGH (ref 70–99)

## 2022-12-23 NOTE — Progress Notes (Signed)
AVS given to patient and explained at the bedside. Medications and follow up appointments have been explained with pt verbalizing understanding. Home medications returned from pharmacy and bus pass provided for transport.

## 2022-12-23 NOTE — TOC Transition Note (Signed)
Transition of Care [facility]) - CM/SW Discharge Note   Patient Details  Name: Michael [name] MRN: [id number] Date of Birth: 01/25/1951  [facility]) CM/SW Contact:  Illene Regulus, LCSW Phone Number: 12/23/2022, 10:08 AM   Clinical Narrative:    Pt [place] appeal was denied , CSW provided pt with denial letter from [employer]. Pt was given a bus pass. Pt received substance abuse resources on 12/16/22 and additional resources was added to pt's AVS. No additional TOC needs , TOC sign off.       Barriers to Discharge: Continued Medical Work up   Patient Goals and CMS Choice      Discharge Placement                         Discharge Plan and Services Additional resources added to the After Visit Summary for                                       Social Determinants of Health (SDOH) Interventions SDOH Screenings   Food Insecurity: No Food Insecurity (12/16/2022)  Recent Concern: [place] Present (12/12/2022)  Housing: Medium Risk (12/12/2022)  Transportation Needs: No Transportation Needs (12/16/2022)  Recent Concern: Transportation Needs - Unmet Transportation Needs (12/12/2022)  Utilities: Not At Risk (12/12/2022)  Recent Concern: Utilities - At Risk (10/22/2022)  Alcohol Screen: Low Risk  (11/27/2022)  Depression (PHQ2-9): High Risk (11/12/2021)  Financial Resource Strain: Medium Risk (09/16/2018)  Physical Activity: Unknown (04/22/2019)  Social Connections: Socially Isolated (09/16/2018)  Stress: Stress Concern Present (09/16/2018)  Tobacco Use: Low Risk  (12/12/2022)     Readmission Risk Interventions    07/05/2022   10:08 AM  Readmission Risk Prevention Plan  Transportation Screening Complete  Medication Review ([place]) Complete  PCP or Specialist appointment within 3-5 days of discharge Complete  HRI or [place] Complete  SW Recovery Care/Counseling Consult Complete  [name] Not Applicable

## 2022-12-23 NOTE — Discharge Summary (Signed)
Physician Discharge Summary   Patient: Michael [name] MRN: [id number] DOB: October 13, 1950  Admit date:     12/11/2022  Discharge date: 12/23/22  Discharge Physician: Bonnielee Haff    Recommendations at discharge:   Follow Up with ID for further care of HIV Follow up with nephrology.  Encourage Cocaine cessation    Discharge Diagnoses:   AKI (acute kidney injury) ([place])   Cocaine use disorder ([place])   Essential hypertension   Paroxysmal A-fib ([name])   HIV (human immunodeficiency virus infection) ([place])   COPD (chronic obstructive pulmonary disease) ([place])   Hyperlipidemia with target LDL less than 100   Type II diabetes mellitus with renal manifestations ([name])   GERD (gastroesophageal reflux disease)   S/P CABG (coronary artery bypass graft)   MDD (major depressive disorder), recurrent episode, moderate ([name])   Anemia of chronic disease   Brief HPI: 72 year old with past medical history significant for paroxysmal A-fib, anemia, asthma, CAD, medial femoral condyle chondromalacia, stage IV CKD, collagen vascular disease, history of drug abuse (cocaine over more than 20 years), depression, diabetes type 2, gout, history of HIV, hypertension and PE, recent complaining of worsening chronic chest pain and suicidal ideation. Patient also found to have AKI on CKD.  Evaluated by psych who recommended a routine level of observation.  Assessment and Plan:  AKI on CKD stage IV Metabolic  Acidosis, Hyperkalemia: -Treated  with IV fluids -Strict I's and O's -Received Lokelma with improvement in potassium levels. -Started sodium bicarb, continue.  Renal function improved and back to baseline.  Stable as of 3/16.   Precordial chest pain Status post CABG Reports worsening of his chronic chest pain, he has had chest pain since after he had CABG.  Pain is worse on palpation.  Likely musculoskeletal pain. Also chest pain in the setting of cocaine use Troponin not significantly  elevated. Continue with Imdur, tylenol  ECHO; ejection fraction 50%, no significant changes since prior 2D echo MR angio chest: No convincing evidence of aortic dissection or other acute aortic abnormality. Concentric hypertrophy of the left ventricular myocardium consistent with the clinical history of hypertension.  Cocaine use disorder Counseling provided. Recurrent admission for Same.    Suicidal ideation, MDD: Continue fluoxetine and Seroquel Psych recommend continue observation Cymbalta discontinue by Psych. Patient wasn't clear when this medication was started.  Psych recommend door to door inpatient substance rehab if possible. SW consulted. Patient  was provided information for rehab.    Essential hypertension: Continue with Cardizem.  Blood pressure is reasonably well-controlled.   A-fib: continue with Cardizem But not a good candidate for anticoagulation.   HIV: Not currently on therapy Will need follow up with ID   PAD: Continue with bronchodilators as needed   Hyperlipidemia: resume Lipitor.    Diabetes type 2 with renal manifestation SSI   Anemia of chronic disease: Hemoglobin stable as of 3/16.   GERD: Continue with PPI   Abdominal pain, lower quadrant, likely related to constipation; KUB; significant stool burden.  Started on bowel regimen.  Did have a bowel movement on 3/12.  No further BMs charted.  Patient mentions that he had a bowel movement yesterday, 3/15.  Patient remains stable.  Waiting on decision regarding his appeal against discharge.  Remains medically stable for discharge.     DISCHARGE MEDICATION: Allergies as of 12/23/2022   No Known Allergies      Medication List     STOP taking these medications    DULoxetine 20 MG capsule Commonly known  as: CYMBALTA       TAKE these medications    Accu-Chek Guide test strip Generic drug: glucose blood use as directed up to four times daily   Accu-Chek Guide w/Device Kit Use as  directed.   Accu-Chek Softclix Lancets lancets Use as directed up to four times daily   albuterol 108 (90 Base) MCG/ACT inhaler Commonly known as: VENTOLIN HFA Inhale 1-2 puffs into the lungs every 6 (six) hours as needed for wheezing or shortness of breath.   atorvastatin 40 MG tablet Commonly known as: LIPITOR Take 1 tablet (40 mg total) by mouth daily.   BD Pen Needle Nano U/F 32G X 4 MM Misc Generic drug: Insulin Pen Needle Use as directed up to 4 times daily.   Breo Ellipta 200-25 MCG/ACT Aepb Generic drug: fluticasone furoate-vilanterol Inhale 1 puff into the lungs daily.   diltiazem 120 MG 24 hr capsule Commonly known as: CARDIZEM CD Take 1 capsule (120 mg total) by mouth daily.   ferrous sulfate 325 (65 FE) MG tablet Take 1 tablet (325 mg total) by mouth daily with breakfast.   FLUoxetine 20 MG capsule Commonly known as: PROZAC Take 1 capsule (20 mg total) by mouth daily.   folic acid 1 MG tablet Commonly known as: FOLVITE Take 1 tablet (1 mg total) by mouth daily.   isosorbide mononitrate 30 MG 24 hr tablet Commonly known as: IMDUR Take 0.5 tablets (15 mg total) by mouth daily.   nitroGLYCERIN 0.4 MG SL tablet Commonly known as: NITROSTAT Place 1 tablet (0.4 mg total) under the tongue every 5 (five) minutes as needed for chest pain.   pantoprazole 40 MG tablet Commonly known as: PROTONIX Take 1 tablet (40 mg total) by mouth daily.   polyethylene glycol 17 g packet Commonly known as: MIRALAX / GLYCOLAX Take 17 g by mouth 2 (two) times daily.   QUEtiapine 50 MG tablet Commonly known as: SEROQUEL Take 1 tablet (50 mg total) by mouth at bedtime.   sodium bicarbonate 650 MG tablet Take 1 tablet (650 mg total) by mouth 3 (three) times daily.        Follow-up Information     [name], MD Follow up in 1 week(s).   Specialty: Internal Medicine Contact information: [place] [address] [phone]                 Discharge Exam: Filed Weights   12/20/22 0500 12/22/22 0500 12/23/22 0500  Weight: 68.2 kg 64.8 kg 65.9 kg   He is a alert.  In no distress.  Condition at discharge: stable  The results of significant diagnostics from this hospitalization (including imaging, microbiology, ancillary and laboratory) are listed below for reference.   Imaging Studies: CT ABDOMEN PELVIS WO CONTRAST  Result Date: 12/16/2022 CLINICAL DATA:  Acute abdominal pain EXAM: CT ABDOMEN AND PELVIS WITHOUT CONTRAST TECHNIQUE: Multidetector CT imaging of the abdomen and pelvis was performed following the standard protocol without IV contrast. RADIATION DOSE REDUCTION: This exam was performed according to the departmental dose-optimization program which includes automated exposure control, adjustment of the mA and/or kV according to patient size and/or use of iterative reconstruction technique. COMPARISON:  Abdominal x-ray 12/14/2022. CT abdomen and pelvis 11/10/2022. FINDINGS: Lower chest: No acute abnormality. Hepatobiliary: No focal liver abnormality is seen. No gallstones, gallbladder wall thickening, or biliary dilatation. Pancreas: Unremarkable. No pancreatic ductal dilatation or surrounding inflammatory changes. Spleen: Normal in size without focal abnormality. Adrenals/Urinary Tract: There is diffuse bladder  wall thickening versus normal under distension. The kidneys and adrenal glands are within normal limits. Stomach/Bowel: Stomach is within normal limits. No evidence of bowel wall thickening, distention, or inflammatory changes. Appendix is not seen. There is a large amount of stool throughout the colon. Vascular/Lymphatic: Aortic atherosclerosis. No enlarged abdominal or pelvic lymph nodes. Reproductive: Prostate is unremarkable. Other: There is a small fat containing periumbilical hernia. There is no ascites. Musculoskeletal: Multilevel degenerative changes affect the spine. Posterior fusion hardware seen at  L5-S1. Sternotomy wires are present. Degenerative changes affect the hips, left greater than right. IMPRESSION: 1. Diffuse bladder wall thickening versus normal under distension. Correlate clinically for cystitis. 2. Large amount of stool throughout the colon. Aortic Atherosclerosis (ICD10-I70.0). Electronically Signed   By: Ronney Asters M.D.   On: 12/16/2022 20:26   DG Abd 1 View  Result Date: 12/14/2022 CLINICAL DATA:  Generalized abdominal pain. EXAM: ABDOMEN - 1 VIEW COMPARISON:  10/12/2022 FINDINGS: Bowel gas pattern is nonobstructive. There is significant stool burden throughout nondilated loops of colon. No evidence for organomegaly or free intraperitoneal air. There chronic degenerative changes in the LEFT hip. Remote lumbosacral fusion. IMPRESSION: 1. Nonobstructive bowel gas pattern. 2. Significant stool burden. Electronically Signed   By: Nolon Nations M.D.   On: 12/14/2022 11:40   ECHOCARDIOGRAM COMPLETE  Result Date: 12/12/2022    ECHOCARDIOGRAM REPORT   Patient Name:   AMEY OLDEN Date of Exam: 12/12/2022 Medical Rec #:  [id number]           Height:       68.0 in Accession #:    [id number]          Weight:       156.0 lb Date of Birth:  20-Nov-1950           BSA:          1.839 m Patient Age:    72 years            BP:           179/101 mmHg Patient Gender: M                   HR:           81 bpm. Exam Location:  Inpatient Procedure: 2D Echo Indications:    chest pain  History:        Patient has prior history of Echocardiogram examinations, most                 recent 09/05/2022. Cardiomyopathy, CAD, COPD; Risk                 Factors:Diabetes, Hypertension and Dyslipidemia.  Sonographer:    Harvie Junior Referring Phys: [id number] Rhetta Mura  Sonographer Comments: Suboptimal subcostal window. Image acquisition challenging due to COPD. IMPRESSIONS  1. Left ventricular ejection fraction, by estimation, is 55 to 60%. Left ventricular ejection fraction by 3D volume is 57 %. The left  ventricle has normal function. The left ventricle demonstrates regional wall motion abnormalities (see scoring diagram/findings for description). Left ventricular diastolic parameters were normal. There is mild hypokinesis of the left ventricular, basal-mid inferoseptal wall and inferior wall.  2. Right ventricular systolic function is normal. The right ventricular size is normal. There is normal pulmonary artery systolic pressure.  3. The mitral valve is normal in structure. No evidence of mitral valve regurgitation. No evidence of mitral stenosis.  4. The aortic valve is tricuspid. Aortic valve regurgitation is not  visualized. No aortic stenosis is present.  5. The inferior vena cava is normal in size with greater than 50% respiratory variability, suggesting right atrial pressure of 3 mmHg. Comparison(s): No significant change from prior study. Prior images reviewed side by side. The left ventricular function is unchanged. The left ventricular wall motion abnormality is unchanged. On review, the inferior/inferoseptal wall motion abnormality  was the same on the 09/05/2022 study. FINDINGS  Left Ventricle: Left ventricular ejection fraction, by estimation, is 55 to 60%. Left ventricular ejection fraction by 3D volume is 57 %. The left ventricle has normal function. The left ventricle demonstrates regional wall motion abnormalities. Mild hypokinesis of the left ventricular, basal-mid inferoseptal wall and inferior wall. The left ventricular internal cavity size was normal in size. There is no left ventricular hypertrophy. Abnormal (paradoxical) septal motion consistent with post-operative status. Left ventricular diastolic parameters were normal. Normal left ventricular filling pressure.  LV Wall Scoring: The inferior wall, mid inferoseptal segment, and basal inferoseptal segment are hypokinetic. Right Ventricle: The right ventricular size is normal. No increase in right ventricular wall thickness. Right ventricular  systolic function is normal. There is normal pulmonary artery systolic pressure. The tricuspid regurgitant velocity is 2.63 m/s, and  with an assumed right atrial pressure of 3 mmHg, the estimated right ventricular systolic pressure is [id number] mmHg. Left Atrium: Left atrial size was normal in size. Right Atrium: Right atrial size was normal in size. Pericardium: There is no evidence of pericardial effusion. Mitral Valve: The mitral valve is normal in structure. No evidence of mitral valve regurgitation. No evidence of mitral valve stenosis. Tricuspid Valve: The tricuspid valve is normal in structure. Tricuspid valve regurgitation is trivial. No evidence of tricuspid stenosis. Aortic Valve: The aortic valve is tricuspid. Aortic valve regurgitation is not visualized. No aortic stenosis is present. Aortic valve mean gradient measures 4.0 mmHg. Aortic valve peak gradient measures 8.5 mmHg. Aortic valve area, by VTI measures 2.39 cm. Pulmonic Valve: The pulmonic valve was normal in structure. Pulmonic valve regurgitation is not visualized. No evidence of pulmonic stenosis. Aorta: The aortic root is normal in size and structure. Venous: The inferior vena cava is normal in size with greater than 50% respiratory variability, suggesting right atrial pressure of 3 mmHg. IAS/Shunts: No atrial level shunt detected by color flow Doppler.  LEFT VENTRICLE PLAX 2D LVIDd:         4.80 cm         Diastology LVIDs:         3.90 cm         LV e' medial:    6.96 cm/s LV PW:         1.05 cm         LV E/e' medial:  10.9 LV IVS:        1.05 cm         LV e' lateral:   8.70 cm/s LVOT diam:     2.00 cm         LV E/e' lateral: 8.7 LV SV:         64 LV SV Index:   35 LVOT Area:     3.14 cm        3D Volume EF                                LV 3D EF:    Left  ventricul LV Volumes (MOD)                            ar LV vol d, MOD    72.0 ml                    ejection A2C:                                         fraction LV vol d, MOD    82.5 ml                    by 3D A4C:                                        volume is LV vol s, MOD    26.7 ml                    57 %. A2C: LV vol s, MOD    36.5 ml A4C:                           3D Volume EF: LV SV MOD A2C:   45.3 ml       3D EF:        57 % LV SV MOD A4C:   82.5 ml       LV EDV:       106 ml LV SV MOD BP:    45.0 ml       LV ESV:       46 ml                                LV SV:        60 ml RIGHT VENTRICLE RV Basal diam:  3.70 cm RV Mid diam:    2.70 cm RV S prime:     9.36 cm/s TAPSE (M-mode): 1.6 cm LEFT ATRIUM             Index        RIGHT ATRIUM           Index LA diam:        3.40 cm 1.85 cm/m   RA Area:     13.20 cm LA Vol (A2C):   36.1 ml 19.63 ml/m  RA Volume:   33.00 ml  17.95 ml/m LA Vol (A4C):   43.5 ml 23.66 ml/m LA Biplane Vol: 38.7 ml 21.04 ml/m  AORTIC VALVE                    PULMONIC VALVE AV Area (Vmax):    2.30 cm     PV Vmax:       1.07 m/s AV Area (Vmean):   2.48 cm     PV Peak grad:  4.6 mmHg AV Area (VTI):     2.39 cm AV Vmax:           146.00 cm/s AV Vmean:          88.900 cm/s AV VTI:            0.268 m AV Peak Grad:  8.5 mmHg AV Mean Grad:      4.0 mmHg LVOT Vmax:         107.00 cm/s LVOT Vmean:        70.200 cm/s LVOT VTI:          0.204 m LVOT/AV VTI ratio: 0.76  AORTA Ao Root diam: 3.30 cm MITRAL VALVE               TRICUSPID VALVE MV Area (PHT): 4.39 cm    TR Peak grad:   27.7 mmHg MV Decel Time: 173 msec    TR Vmax:        263.00 cm/s MR Peak grad: 46.8 mmHg MR Vmax:      342.00 cm/s  SHUNTS MV E velocity: 76.00 cm/s  Systemic VTI:  0.20 m MV A velocity: 60.60 cm/s  Systemic Diam: 2.00 cm MV E/A ratio:  1.25 Mihai Croitoru MD Electronically signed by Sanda Klein MD Signature Date/Time: 12/12/2022/4:20:27 PM    Final    MR ANGIO CHEST WO CONTRAST  Result Date: 12/12/2022 CLINICAL DATA:  Acute aortic syndrome suspected EXAM: MRA CHEST WITH OR WITHOUT CONTRAST TECHNIQUE: Angiographic images of the chest were  obtained using MRA technique without intravenous contrast. CONTRAST:  None. COMPARISON:  None Available. FINDINGS: VASCULAR Aorta: 2 vessel aortic arch. The right brachiocephalic and left common carotid artery share a common origin. No evidence of aneurysm or dissection. Evaluation is slightly limited by both margin artifact and susceptibility artifact from prior median sternotomy and cerclage wires. Heart: The heart is normal in size. Mild concentric hypertrophy of the left ventricular myocardium. No pericardial effusion. Pulmonary Arteries:  The main pulmonary artery is normal in caliber. Other: None. NON-VASCULAR No significant focal signal abnormality within the chest or visualized upper abdomen. IMPRESSION: 1. No convincing evidence of aortic dissection or other acute aortic abnormality. 2. Concentric hypertrophy of the left ventricular myocardium consistent with the clinical history of hypertension. 3. Surgical changes of prior median sternotomy. Electronically Signed   By: Jacqulynn Cadet M.D.   On: 12/12/2022 13:24   DG Chest 2 View  Result Date: 12/12/2022 CLINICAL DATA:  Suicidal ideation. EXAM: CHEST - 2 VIEW COMPARISON:  AP Lat 11/26/2022 FINDINGS: There are CABG changes, coronary artery stents in left atrial appendage closure device. The mediastinum is normally outlined. There is aortic atherosclerosis. The cardiac size is normal The lungs are clear. There is a partially visible lower cervical ACDF plating. Osteopenia and thoracic spondylosis. Comparison to the prior study reveals no significant interval change. IMPRESSION: No active cardiopulmonary disease. CABG changes. Aortic atherosclerosis. Electronically Signed   By: Telford Nab M.D.   On: 12/12/2022 00:39   DG Chest 2 View  Result Date: 11/26/2022 CLINICAL DATA:  Chest pain EXAM: CHEST - 2 VIEW COMPARISON:  CXR 11/16/22 FINDINGS: No pleural effusion. No pneumothorax. Status post median sternotomy. Coronary stent in place. Atrial  appendage occluded device is unchanged in positioning. Partially visualized cervical spinal fusion hardware in place. Unchanged cardiac and mediastinal contours. No focal airspace opacity. No radiographically apparent displaced rib fractures. Degenerative changes of the bilateral AC joints, right-greater-than-left. Visualized upper abdomen is unremarkable. IMPRESSION: No radiographic finding to explain chest pain. Electronically Signed   By: Marin Roberts M.D.   On: 11/26/2022 13:35    Microbiology: Results for orders placed or performed during the hospital encounter of 11/26/22  SARS Coronavirus 2 by RT PCR (hospital order, performed in [facility] hospital lab) *cepheid single result test* Anterior Nasal Swab  Status: None   Collection Time: 11/27/22 11:06 AM   Specimen: Anterior Nasal Swab  Result Value Ref Range Status   SARS Coronavirus 2 by RT PCR NEGATIVE NEGATIVE Final    Comment: Performed at [facility], [place] [address], [place], [place] [address]    Labs: CBC: Recent Labs  Lab 12/21/22 0438  WBC 3.0*  HGB 7.7*  HCT 24.3*  MCV 89.3  PLT 133*    Basic Metabolic Panel: Recent Labs  Lab 12/17/22 0703 12/21/22 0438  NA 139 140  K 4.9 4.7  CL 111 109  CO2 21* 24  GLUCOSE 93 119*  BUN 49* 58*  CREATININE 3.35* 3.51*  CALCIUM 8.0* 8.5*     CBG: Recent Labs  Lab 12/22/22 0733 12/22/22 1212 12/22/22 1659 12/22/22 2105 12/23/22 0724  GLUCAP 99 150* 170* 206* 106*     Discharge time spent: 35 mins.  Signed: Bonnielee Haff, MD [facility] 12/23/2022

## 2022-12-27 IMAGING — CR DG CHEST 2V
2 series · 2 of 2 positions shown · non-contrast
Comparison: Chest radiograph dated 11/11/2021.

CLINICAL DATA: Shortness of breath and chest pain.

EXAM:
CHEST - 2 VIEW

[w chest lat]
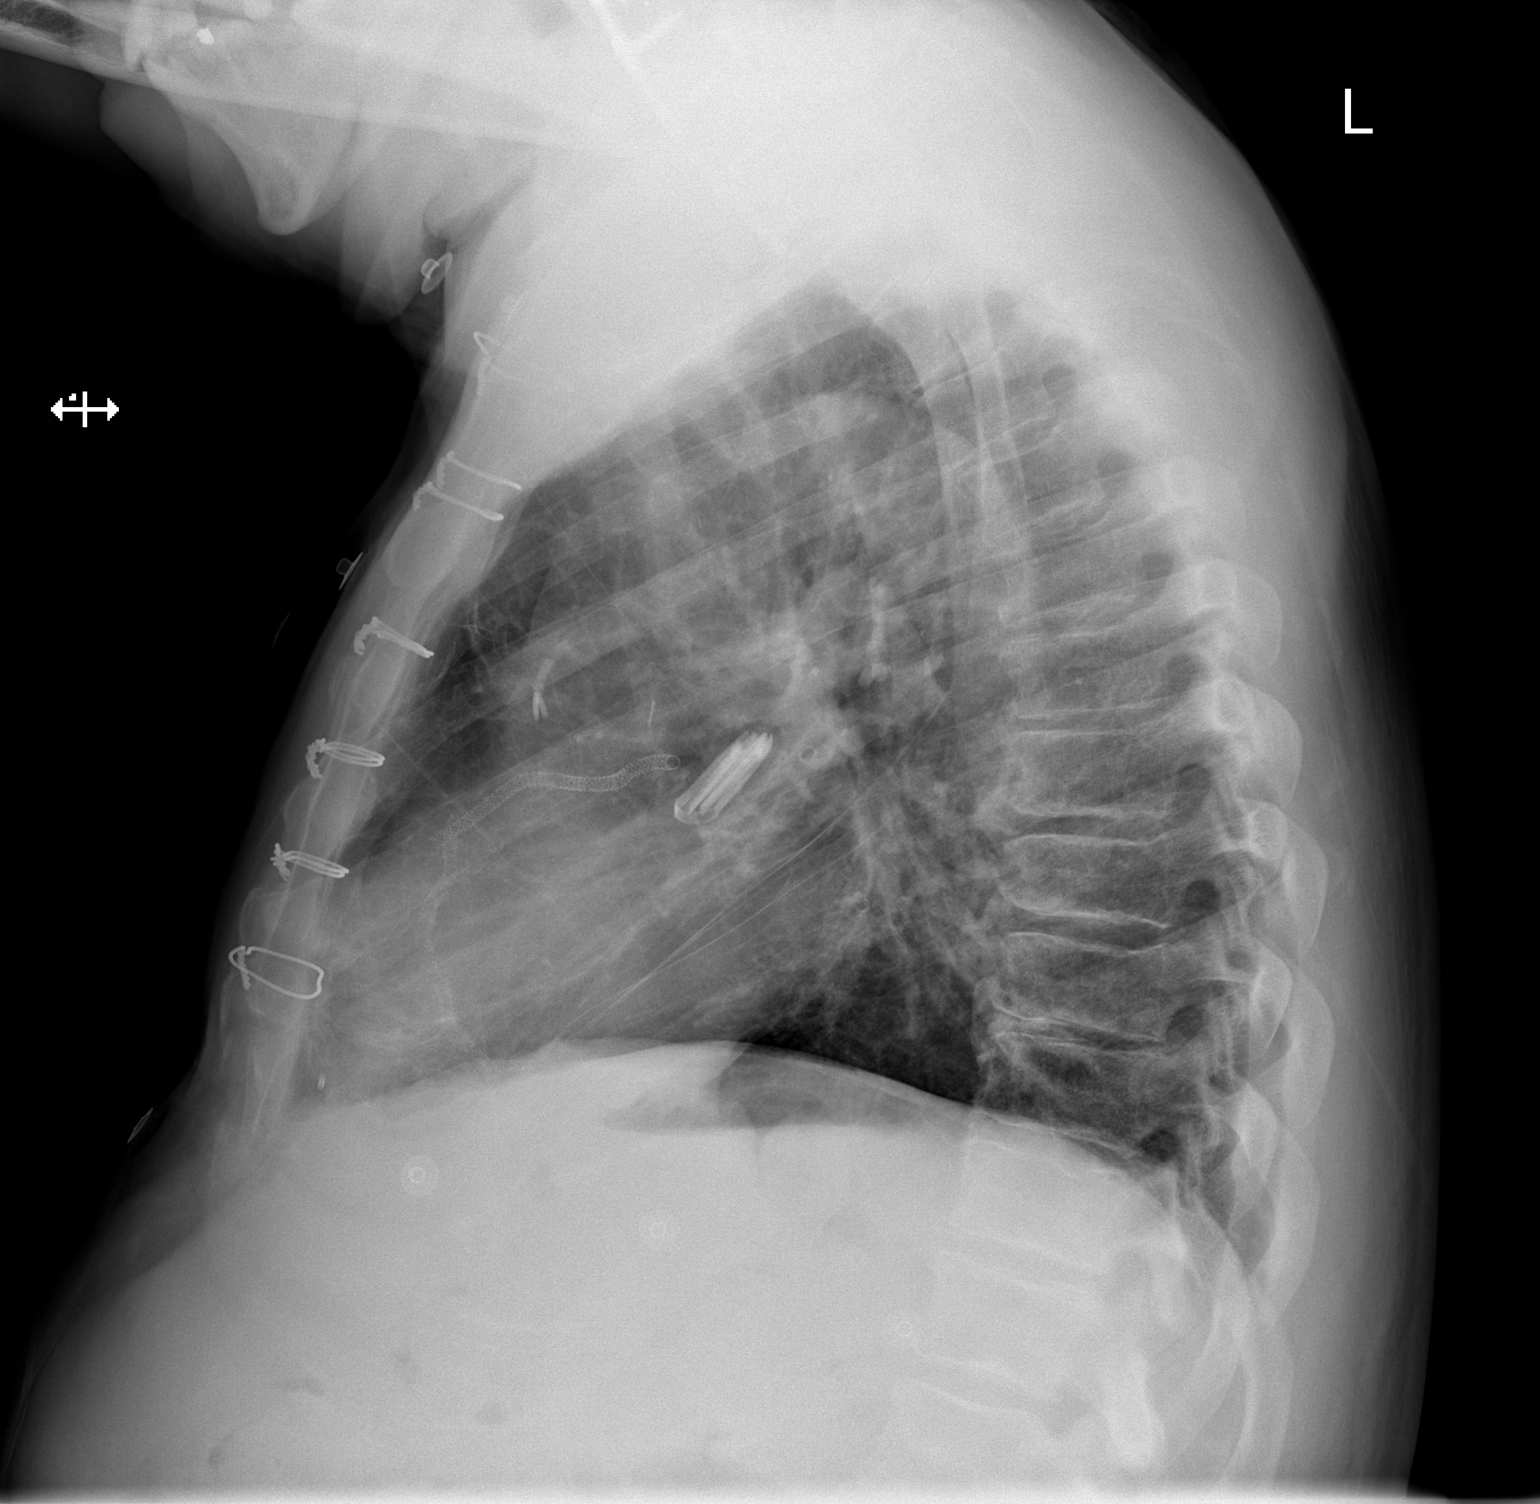

[x chest ap]
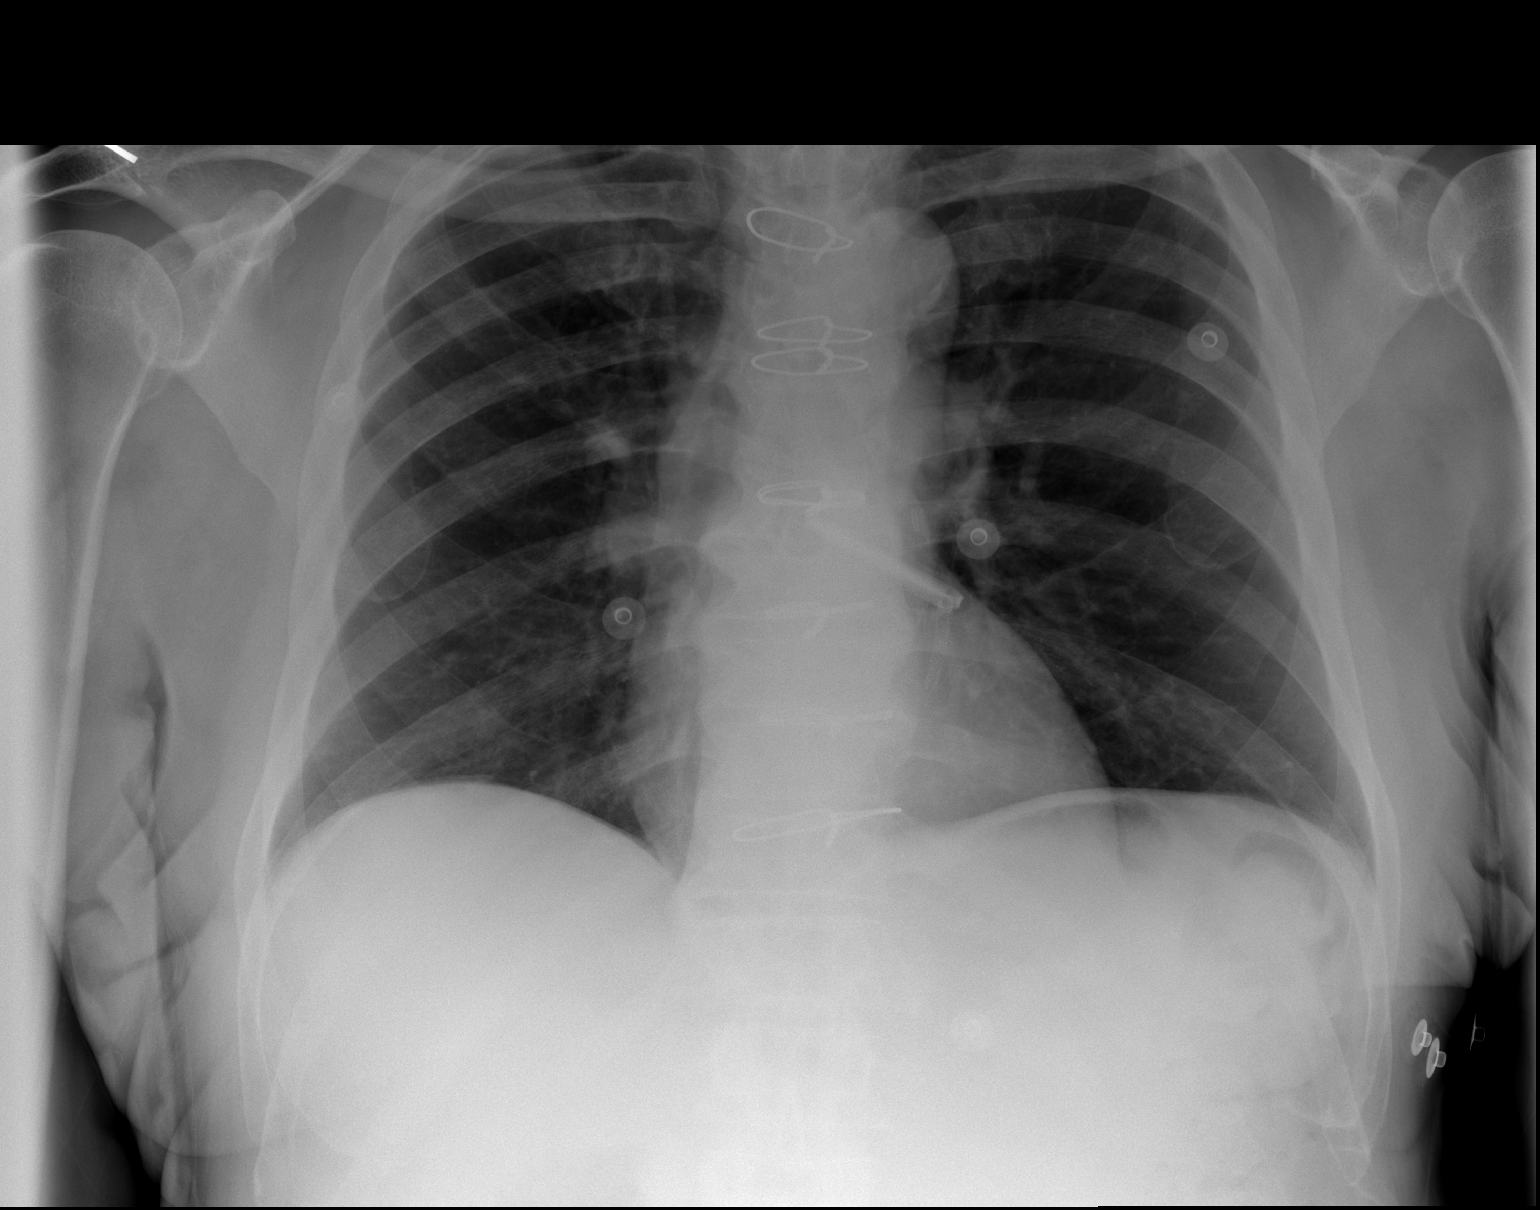

[2 of 2 positions shown; findings below may reference images not displayed]

FINDINGS: No focal consolidation, pleural effusion, or pneumothorax. The
cardiac silhouette is within limits. Median sternotomy wires and
coronary vascular stent. Left atrial appendage occlusive device.
Atherosclerotic calcification of the aorta. No acute osseous
pathology faint
IMPRESSION: No active cardiopulmonary disease.

## 2023-01-07 ENCOUNTER — Encounter (HOSPITAL_COMMUNITY): Payer: Self-pay | Admitting: *Deleted

## 2023-01-07 ENCOUNTER — Emergency Department (HOSPITAL_COMMUNITY)
Admission: EM | Admit: 2023-01-07 | Discharge: 2023-01-08 | Disposition: A | Discharge status: Home / Self Care | Payer: 59 | Attending: Emergency Medicine | Admitting: Emergency Medicine

## 2023-01-07 ENCOUNTER — Other Ambulatory Visit: Payer: Self-pay

## 2023-01-07 DIAGNOSIS — R0602 Shortness of breath: Secondary | ICD-10-CM | POA: Diagnosis not present

## 2023-01-07 DIAGNOSIS — R109 Unspecified abdominal pain: Secondary | ICD-10-CM | POA: Diagnosis not present

## 2023-01-07 DIAGNOSIS — Z794 Long term (current) use of insulin: Secondary | ICD-10-CM | POA: Insufficient documentation

## 2023-01-07 DIAGNOSIS — F1410 Cocaine abuse, uncomplicated: Secondary | ICD-10-CM | POA: Diagnosis present

## 2023-01-07 DIAGNOSIS — N189 Chronic kidney disease, unspecified: Secondary | ICD-10-CM | POA: Insufficient documentation

## 2023-01-07 DIAGNOSIS — I129 Hypertensive chronic kidney disease with stage 1 through stage 4 chronic kidney disease, or unspecified chronic kidney disease: Secondary | ICD-10-CM | POA: Insufficient documentation

## 2023-01-07 DIAGNOSIS — Z79899 Other long term (current) drug therapy: Secondary | ICD-10-CM | POA: Insufficient documentation

## 2023-01-07 DIAGNOSIS — R45851 Suicidal ideations: Secondary | ICD-10-CM | POA: Insufficient documentation

## 2023-01-07 DIAGNOSIS — Z5901 Sheltered homelessness: Secondary | ICD-10-CM | POA: Insufficient documentation

## 2023-01-07 DIAGNOSIS — Z21 Asymptomatic human immunodeficiency virus [HIV] infection status: Secondary | ICD-10-CM | POA: Diagnosis not present

## 2023-01-07 DIAGNOSIS — E1122 Type 2 diabetes mellitus with diabetic chronic kidney disease: Secondary | ICD-10-CM | POA: Insufficient documentation

## 2023-01-07 DIAGNOSIS — F1924 Other psychoactive substance dependence with psychoactive substance-induced mood disorder: Secondary | ICD-10-CM | POA: Diagnosis not present

## 2023-01-07 DIAGNOSIS — F1290 Cannabis use, unspecified, uncomplicated: Secondary | ICD-10-CM | POA: Diagnosis not present

## 2023-01-07 DIAGNOSIS — F1910 Other psychoactive substance abuse, uncomplicated: Secondary | ICD-10-CM | POA: Insufficient documentation

## 2023-01-07 DIAGNOSIS — R079 Chest pain, unspecified: Secondary | ICD-10-CM | POA: Insufficient documentation

## 2023-01-07 DIAGNOSIS — F1994 Other psychoactive substance use, unspecified with psychoactive substance-induced mood disorder: Secondary | ICD-10-CM | POA: Diagnosis present

## 2023-01-07 NOTE — ED Triage Notes (Signed)
Pt arrived by [facility] after smoking crack, c/o suicial ideation that's been ongoing x 4 months. Also reports back pain   160/90; 98% RA, pulse 85

## 2023-01-07 NOTE — ED Triage Notes (Signed)
Pt c/o chest pain and kidney pain. Reports SI with thought of shooting himself in the head

## 2023-01-08 ENCOUNTER — Encounter (HOSPITAL_COMMUNITY): Payer: Self-pay | Admitting: Emergency Medicine

## 2023-01-08 ENCOUNTER — Emergency Department (HOSPITAL_COMMUNITY)
Admission: EM | Admit: 2023-01-08 | Discharge: 2023-01-09 | Disposition: A | Discharge status: Home / Self Care | Payer: 59 | Source: Home / Self Care | Attending: Emergency Medicine | Admitting: Emergency Medicine

## 2023-01-08 DIAGNOSIS — N189 Chronic kidney disease, unspecified: Secondary | ICD-10-CM | POA: Insufficient documentation

## 2023-01-08 DIAGNOSIS — Z794 Long term (current) use of insulin: Secondary | ICD-10-CM | POA: Insufficient documentation

## 2023-01-08 DIAGNOSIS — Z21 Asymptomatic human immunodeficiency virus [HIV] infection status: Secondary | ICD-10-CM | POA: Insufficient documentation

## 2023-01-08 DIAGNOSIS — Z79899 Other long term (current) drug therapy: Secondary | ICD-10-CM | POA: Insufficient documentation

## 2023-01-08 DIAGNOSIS — R109 Unspecified abdominal pain: Secondary | ICD-10-CM | POA: Insufficient documentation

## 2023-01-08 DIAGNOSIS — F1910 Other psychoactive substance abuse, uncomplicated: Secondary | ICD-10-CM | POA: Insufficient documentation

## 2023-01-08 DIAGNOSIS — E1122 Type 2 diabetes mellitus with diabetic chronic kidney disease: Secondary | ICD-10-CM | POA: Insufficient documentation

## 2023-01-08 DIAGNOSIS — R10A2 Flank pain, left side: Secondary | ICD-10-CM

## 2023-01-08 DIAGNOSIS — I129 Hypertensive chronic kidney disease with stage 1 through stage 4 chronic kidney disease, or unspecified chronic kidney disease: Secondary | ICD-10-CM | POA: Insufficient documentation

## 2023-01-08 LAB — I-STAT CHEM 8, ED
BUN: 52 mg/dL — ABNORMAL HIGH (ref 8–23)
Calcium, Ion: 0.91 mmol/L — ABNORMAL LOW (ref 1.15–1.40)
Chloride: 113 mmol/L — ABNORMAL HIGH (ref 98–111)
Creatinine, Ser: 4.4 mg/dL — ABNORMAL HIGH (ref 0.61–1.24)
Glucose, Bld: 140 mg/dL — ABNORMAL HIGH (ref 70–99)
HCT: 27 % — ABNORMAL LOW (ref 39.0–52.0)
Hemoglobin: 9.2 g/dL — ABNORMAL LOW (ref 13.0–17.0)
Potassium: 4.8 mmol/L (ref 3.5–5.1)
Sodium: 140 mmol/L (ref 135–145)
TCO2: 18 mmol/L — ABNORMAL LOW (ref 22–32)

## 2023-01-08 LAB — RAPID URINE DRUG SCREEN, HOSP PERFORMED
Amphetamines: NOT DETECTED
Barbiturates: NOT DETECTED
Benzodiazepines: NOT DETECTED
Cocaine: POSITIVE — AB
Opiates: NOT DETECTED
Tetrahydrocannabinol: NOT DETECTED

## 2023-01-08 LAB — COMPREHENSIVE METABOLIC PANEL
ALT: 15 U/L (ref 0–44)
AST: 17 U/L (ref 15–41)
Albumin: 3.8 g/dL (ref 3.5–5.0)
Alkaline Phosphatase: 64 U/L (ref 38–126)
Anion gap: 7 (ref 5–15)
BUN: 55 mg/dL — ABNORMAL HIGH (ref 8–23)
CO2: 21 mmol/L — ABNORMAL LOW (ref 22–32)
Calcium: 8.6 mg/dL — ABNORMAL LOW (ref 8.9–10.3)
Chloride: 111 mmol/L (ref 98–111)
Creatinine, Ser: 3.59 mg/dL — ABNORMAL HIGH (ref 0.61–1.24)
GFR, Estimated: 17 mL/min — ABNORMAL LOW (ref >60)
Glucose, Bld: 152 mg/dL — ABNORMAL HIGH (ref 70–99)
Potassium: 4.7 mmol/L (ref 3.5–5.1)
Sodium: 139 mmol/L (ref 135–145)
Total Bilirubin: 0.4 mg/dL (ref 0.3–1.2)
Total Protein: 7 g/dL (ref 6.5–8.1)

## 2023-01-08 LAB — CBC
HCT: 26.9 % — ABNORMAL LOW (ref 39.0–52.0)
Hemoglobin: 8.9 g/dL — ABNORMAL LOW (ref 13.0–17.0)
MCH: 29.3 pg (ref 26.0–34.0)
MCHC: 33.1 g/dL (ref 30.0–36.0)
MCV: 88.5 fL (ref 80.0–100.0)
Platelets: 128 10*3/uL — ABNORMAL LOW (ref 150–400)
RBC: 3.04 MIL/uL — ABNORMAL LOW (ref 4.22–5.81)
RDW: 13.2 % (ref 11.5–15.5)
WBC: 3.8 10*3/uL — ABNORMAL LOW (ref 4.0–10.5)
nRBC: 0 % (ref 0.0–0.2)

## 2023-01-08 LAB — URINALYSIS, ROUTINE W REFLEX MICROSCOPIC
Bacteria, UA: NONE SEEN
Bilirubin Urine: NEGATIVE
Glucose, UA: 50 mg/dL — AB
Ketones, ur: NEGATIVE mg/dL
Leukocytes,Ua: NEGATIVE
Nitrite: NEGATIVE
Protein, ur: >=300 mg/dL — AB
Specific Gravity, Urine: 1.015 (ref 1.005–1.030)
pH: 5 (ref 5.0–8.0)

## 2023-01-08 LAB — TROPONIN I (HIGH SENSITIVITY)
Troponin I (High Sensitivity): 18 ng/L — ABNORMAL HIGH (ref <18)
Troponin I (High Sensitivity): 18 ng/L — ABNORMAL HIGH (ref <18)

## 2023-01-08 LAB — SALICYLATE LEVEL: Salicylate Lvl: <7 mg/dL — ABNORMAL LOW (ref 7.0–30.0)

## 2023-01-08 LAB — ETHANOL: Alcohol, Ethyl (B): <10 mg/dL (ref <10)

## 2023-01-08 LAB — ACETAMINOPHEN LEVEL: Acetaminophen (Tylenol), Serum: <10 ug/mL — ABNORMAL LOW (ref 10–30)

## 2023-01-08 NOTE — ED Provider Notes (Signed)
[facility] Provider Note   CSN: [id number] Arrival date & time: 01/08/23  2148     History  Chief Complaint  Patient presents with   Suicidal   Chest Pain    Michael [name] is a 72 y.o. male.  The history is provided by the patient and medical records. No language interpreter was used.  Chest Pain    72 year old male with history of poorly controlled diabetes, cardiac disease, HIV, hypertension, polysubstance use including cocaine use, hepatitis C, chronic kidney disease presenting today with complaints of flank pain.  Patient mention he has had pain to his left flank for the past several months since he was told that he has kidney problem.  Pain has not gotten any worse but he does voice concern.  He also endorsed chest pain and having suicidal ideations.  Chest pain is unchanged since he had his CABG a year ago.  Pain is persistent with some trouble breathing which is unchanged.  He also endorsed intermittent episodes of suicidal ideation without any specific plan.  He was seen in the ED earlier this morning for the same complaint and was discharged home.  He returns with essentially the same complaint.  Home Medications Prior to Admission medications   Medication Sig Start Date End Date Taking? Authorizing Provider  Accu-Chek Softclix Lancets lancets Use as directed up to four times daily 09/06/22   Elodia Florence., MD  albuterol (VENTOLIN HFA) 108 (90 Base) MCG/ACT inhaler Inhale 1-2 puffs into the lungs every 6 (six) hours as needed for wheezing or shortness of breath. 10/24/22   [place] Eric J, DO  atorvastatin (LIPITOR) 40 MG tablet Take 1 tablet (40 mg total) by mouth daily. 12/09/22 04/08/23  Parks Ranger, DO  Blood Glucose Monitoring Suppl (ACCU-CHEK GUIDE) w/Device KIT Use as directed. 09/06/22   Elodia Florence., MD  diltiazem (CARDIZEM CD) 120 MG 24 hr capsule Take 1 capsule (120 mg total) by mouth daily.  12/09/22 01/08/23  Parks Ranger, DO  ferrous sulfate 325 (65 FE) MG tablet Take 1 tablet (325 mg total) by mouth daily with breakfast. 12/17/22   Regalado, Belkys A, MD  FLUoxetine (PROZAC) 20 MG capsule Take 1 capsule (20 mg total) by mouth daily. 12/17/22   Regalado, Belkys A, MD  fluticasone furoate-vilanterol (BREO ELLIPTA) 200-25 MCG/ACT AEPB Inhale 1 puff into the lungs daily. 12/10/22   Parks Ranger, DO  folic acid (FOLVITE) 1 MG tablet Take 1 tablet (1 mg total) by mouth daily. 12/17/22   Regalado, Belkys A, MD  glucose blood test strip use as directed up to four times daily 09/06/22   Elodia Florence., MD  Insulin Pen Needle 32G X 4 MM MISC Use as directed up to 4 times daily. 09/06/22   Elodia Florence., MD  isosorbide mononitrate (IMDUR) 30 MG 24 hr tablet Take 0.5 tablets (15 mg total) by mouth daily. 12/10/22   Parks Ranger, DO  nitroGLYCERIN (NITROSTAT) 0.4 MG SL tablet Place 1 tablet (0.4 mg total) under the tongue every 5 (five) minutes as needed for chest pain. 12/09/22   Parks Ranger, DO  pantoprazole (PROTONIX) 40 MG tablet Take 1 tablet (40 mg total) by mouth daily. 12/17/22   Regalado, Belkys A, MD  polyethylene glycol (MIRALAX / GLYCOLAX) 17 g packet Take 17 g by mouth 2 (two) times daily. 12/17/22   Regalado, Belkys A, MD  QUEtiapine (SEROQUEL) 50  MG tablet Take 1 tablet (50 mg total) by mouth at bedtime. 12/09/22   Parks Ranger, DO  sodium bicarbonate 650 MG tablet Take 1 tablet (650 mg total) by mouth 3 (three) times daily. 12/16/22   Regalado, Cassie Freer, MD      Allergies    Patient has no known allergies.    Review of Systems   Review of Systems  Cardiovascular:  Positive for chest pain.  All other systems reviewed and are negative.   Physical Exam Updated Vital Signs BP 119/79 (BP Location: Right Arm)   Pulse 84   Temp 98.5 F (36.9 C) (Oral)   Resp 19   SpO2 100%  Physical Exam Vitals and nursing note  reviewed.  Constitutional:      General: He is not in acute distress.    Appearance: He is well-developed.  HENT:     Head: Atraumatic.  Eyes:     Conjunctiva/sclera: Conjunctivae normal.  Cardiovascular:     Rate and Rhythm: Normal rate and regular rhythm.     Pulses: Normal pulses.     Heart sounds: Normal heart sounds.  Pulmonary:     Effort: Pulmonary effort is normal.     Breath sounds: Normal breath sounds.  Abdominal:     Palpations: Abdomen is soft.     Tenderness: There is no abdominal tenderness. There is no right CVA tenderness or left CVA tenderness.  Musculoskeletal:     Cervical back: Neck supple.     Right lower leg: No edema.     Left lower leg: No edema.  Skin:    Findings: No rash.  Neurological:     Mental Status: He is alert.     ED Results / Procedures / Treatments   Labs (all labs ordered are listed, but only abnormal results are displayed) Labs Reviewed  URINALYSIS, ROUTINE W REFLEX MICROSCOPIC - Abnormal; Notable for the following components:      Result Value   Glucose, UA 50 (*)    Hgb urine dipstick MODERATE (*)    Protein, ur >=300 (*)    All other components within normal limits  CBC WITH DIFFERENTIAL/PLATELET - Abnormal; Notable for the following components:   WBC 3.4 (*)    RBC 3.02 (*)    Hemoglobin 8.8 (*)    HCT 25.9 (*)    Platelets 117 (*)    All other components within normal limits  RAPID URINE DRUG SCREEN, HOSP PERFORMED - Abnormal; Notable for the following components:   Cocaine POSITIVE (*)    All other components within normal limits  BASIC METABOLIC PANEL - Abnormal; Notable for the following components:   Chloride 112 (*)    CO2 19 (*)    BUN 53 (*)    Creatinine, Ser 3.41 (*)    Calcium 7.7 (*)    GFR, Estimated 18 (*)    All other components within normal limits  I-STAT CHEM 8, ED - Abnormal; Notable for the following components:   Chloride 113 (*)    BUN 52 (*)    Creatinine, Ser 4.40 (*)    Glucose, Bld 140  (*)    Calcium, Ion 0.91 (*)    TCO2 18 (*)    Hemoglobin 9.2 (*)    HCT 27.0 (*)    All other components within normal limits  TROPONIN I (HIGH SENSITIVITY) - Abnormal; Notable for the following components:   Troponin I (High Sensitivity) 18 (*)    All other components within  normal limits  TROPONIN I (HIGH SENSITIVITY) - Abnormal; Notable for the following components:   Troponin I (High Sensitivity) 19 (*)    All other components within normal limits    EKG None  Radiology CT Renal Stone Study  Result Date: 01/09/2023 CLINICAL DATA:  Flank pain. EXAM: CT ABDOMEN AND PELVIS WITHOUT CONTRAST TECHNIQUE: Multidetector CT imaging of the abdomen and pelvis was performed following the standard protocol without IV contrast. RADIATION DOSE REDUCTION: This exam was performed according to the departmental dose-optimization program which includes automated exposure control, adjustment of the mA and/or kV according to patient size and/or use of iterative reconstruction technique. COMPARISON:  CT abdomen pelvis dated 12/16/2022. FINDINGS: Evaluation of this exam is limited in the absence of intravenous contrast. Lower chest: The visualized lung bases are clear. There is coronary vascular calcification. No intra-abdominal free air or free fluid. Hepatobiliary: The liver is unremarkable. No biliary dilatation. The gallbladder is unremarkable. Pancreas: Unremarkable. No pancreatic ductal dilatation or surrounding inflammatory changes. Spleen: Normal in size without focal abnormality. Adrenals/Urinary Tract: The adrenal glands are unremarkable. There is no hydronephrosis or nephrolithiasis on either side. The visualized ureters and urinary bladder appear unremarkable. Stomach/Bowel: There is no bowel obstruction or active inflammation. The appendix is normal. Vascular/Lymphatic: Mild aortoiliac atherosclerotic disease. The IVC is unremarkable. No portal venous gas. There is no adenopathy. Reproductive: The  prostate and seminal vesicles are grossly unremarkable. No pelvic mass. Other: None Musculoskeletal: Osteopenia with degenerative changes of spine. No acute osseous pathology. IMPRESSION: 1. No acute intra-abdominal or pelvic pathology. No hydronephrosis or nephrolithiasis. 2. No bowel obstruction. Normal appendix. 3.  Aortic Atherosclerosis (ICD10-I70.0). Electronically Signed   By: Anner Crete M.D.   On: 01/09/2023 03:16    Procedures Procedures    Medications Ordered in ED Medications  sodium chloride 0.9 % bolus 1,000 mL (0 mLs Intravenous Stopped 01/09/23 0142)    ED Course/ Medical Decision Making/ A&P                             Medical Decision Making Amount and/or Complexity of Data Reviewed Labs: ordered. Radiology: ordered.   BP 119/79 (BP Location: Right Arm)   Pulse 84   Temp 98.5 F (36.9 C) (Oral)   Resp 19   SpO2 100%   27:50 PM 72 year old male with history of poorly controlled diabetes, cardiac disease, HIV, hypertension, polysubstance use including cocaine use, hepatitis C, chronic kidney disease presenting today with complaints of flank pain.  Patient mention he has had pain to his left flank for the past several months since he was told that he has kidney problem.  Pain has not gotten any worse but he does voice concern.  He also endorsed chest pain and having suicidal ideations.  Chest pain is unchanged since he had his CABG a year ago.  Pain is persistent with some trouble breathing which is unchanged.  He also endorsed intermittent episodes of suicidal ideation without any specific plan.  He was seen in the ED earlier this morning for the same complaint and was discharged home.  He returns with essentially the same complaint.  Last cocaine use was yesterday.  States he is eating and drinking fine.  No history of kidney stone.  On exam, patient resting comfortably appears to be in no acute discomfort.  Heart with normal rate and rhythm, lungs clear to  auscultation abdomen soft nontender no CVA tenderness.  Patient does not appear  dehydrated.  He is calm and making eye contact and not responding to any internal stimuli.  Patient was seen less than a day ago for same complaint of chest pain as well as suicidal ideation.  The symptoms are chronic in nature and yesterday he was evaluated by psychiatry team who deemed his mental illness does not qualify for psych admission.  I agree.  Since he does voice concerns for worsening kidney problems as well as having flank pain we will recheck this renal function to help determine disposition.  -Labs ordered, independently viewed and interpreted by me.  Labs remarkable for Cr 4.4.  It was 3.59 1 day ago.  The remainder of the labs are at his baseline -The patient was maintained on a cardiac monitor.  I personally viewed and interpreted the cardiac monitored which showed an underlying rhythm of: NSR -Imaging including CXR obtained earlier in the day was reviewed and interpreted by me and I agree with radiologist's interpretation and it was unremarkable -This patient presents to the ED for concern of L flank pain, this involves an extensive number of treatment options, and is a complaint that carries with it a high risk of complications and morbidity.  The differential diagnosis includes kidney stone, renal disease, MSK pain, ACS, shingles -Co morbidities that complicate the patient evaluation includes CAD, HIV, DM -Treatment includes IVF -Reevaluation of the patient after these medicines showed that the patient improved -PCP office notes or outside notes reviewed -Discussion with [facility] Dr. Myna Hidalgo for request of hospital admission for AKI.  He request for a BMP to check renal function.  On recheck Cr is 3.41 which is at his baseline.  Will not need hospital admission.  -Escalation to admission/observation considered: patients feels much better, is comfortable with discharge, and will follow up with  PCP -Prescription medication considered, patient comfortable with home medication -Social Determinant of Health considered which includes cocaine use, recommend cessation  3:40 AM Recheck BMP shows renal function at his baseline.  A CT scan of the abdomen pelvis was obtained independently viewed and treatment by me and without any acute concerning changes.  At this time I felt patient stable to be discharged home.  I recommend patient to avoid cocaine use.         Final Clinical Impression(s) / ED Diagnoses Final diagnoses:  Polysubstance abuse  Left flank pain    Rx / DC Orders ED Discharge Orders     None         Domenic Moras, PA-C 01/09/23 [place], [place] A, DO 01/09/23 [id number]

## 2023-01-08 NOTE — ED Notes (Signed)
Pt has 2 pt belonging bags located in cabinet at nurses station.

## 2023-01-08 NOTE — ED Provider Notes (Incomplete)
[facility] Provider Note   CSN: [id number] Arrival date & time: 01/08/23  2148     History {Add pertinent medical, surgical, social history, OB history to HPI:1} Chief Complaint  Patient presents with  . Suicidal  . Chest Pain    Jaquante Kirschenmann is a 72 y.o. male.  The history is provided by the patient and medical records. No language interpreter was used.  Chest Pain    73 year old male with history of poorly controlled diabetes, cardiac disease, HIV, hypertension, polysubstance use including cocaine use, hepatitis C, chronic kidney disease presenting today with complaints of flank pain.  Patient mention he has had pain to his left flank for the past several months since he was told that he has kidney problem.  Pain has not gotten any worse but he does voice concern.  He also endorsed chest pain and having suicidal ideations.  Chest pain is unchanged since he had his CABG a year ago.  Pain is persistent with some trouble breathing which is unchanged.  He also endorsed intermittent episodes of suicidal ideation without any specific plan.  He was seen in the ED earlier this morning for the same complaint and was discharged home.  He returns with essentially the same complaint.  Home Medications Prior to Admission medications   Medication Sig Start Date End Date Taking? Authorizing Provider  Accu-Chek Softclix Lancets lancets Use as directed up to four times daily 09/06/22   Elodia Florence., MD  albuterol (VENTOLIN HFA) 108 (90 Base) MCG/ACT inhaler Inhale 1-2 puffs into the lungs every 6 (six) hours as needed for wheezing or shortness of breath. 10/24/22   [place] Eric J, DO  atorvastatin (LIPITOR) 40 MG tablet Take 1 tablet (40 mg total) by mouth daily. 12/09/22 04/08/23  Parks Ranger, DO  Blood Glucose Monitoring Suppl (ACCU-CHEK GUIDE) w/Device KIT Use as directed. 09/06/22   Elodia Florence., MD  diltiazem (CARDIZEM  CD) 120 MG 24 hr capsule Take 1 capsule (120 mg total) by mouth daily. 12/09/22 01/08/23  Parks Ranger, DO  ferrous sulfate 325 (65 FE) MG tablet Take 1 tablet (325 mg total) by mouth daily with breakfast. 12/17/22   Regalado, Belkys A, MD  FLUoxetine (PROZAC) 20 MG capsule Take 1 capsule (20 mg total) by mouth daily. 12/17/22   Regalado, Belkys A, MD  fluticasone furoate-vilanterol (BREO ELLIPTA) 200-25 MCG/ACT AEPB Inhale 1 puff into the lungs daily. 12/10/22   Parks Ranger, DO  folic acid (FOLVITE) 1 MG tablet Take 1 tablet (1 mg total) by mouth daily. 12/17/22   Regalado, Belkys A, MD  glucose blood test strip use as directed up to four times daily 09/06/22   Elodia Florence., MD  Insulin Pen Needle 32G X 4 MM MISC Use as directed up to 4 times daily. 09/06/22   Elodia Florence., MD  isosorbide mononitrate (IMDUR) 30 MG 24 hr tablet Take 0.5 tablets (15 mg total) by mouth daily. 12/10/22   Parks Ranger, DO  nitroGLYCERIN (NITROSTAT) 0.4 MG SL tablet Place 1 tablet (0.4 mg total) under the tongue every 5 (five) minutes as needed for chest pain. 12/09/22   Parks Ranger, DO  pantoprazole (PROTONIX) 40 MG tablet Take 1 tablet (40 mg total) by mouth daily. 12/17/22   Regalado, Belkys A, MD  polyethylene glycol (MIRALAX / GLYCOLAX) 17 g packet Take 17 g by mouth 2 (two) times daily. 12/17/22  Regalado, Belkys A, MD  QUEtiapine (SEROQUEL) 50 MG tablet Take 1 tablet (50 mg total) by mouth at bedtime. 12/09/22   Parks Ranger, DO  sodium bicarbonate 650 MG tablet Take 1 tablet (650 mg total) by mouth 3 (three) times daily. 12/16/22   Regalado, Cassie Freer, MD      Allergies    Patient has no known allergies.    Review of Systems   Review of Systems  Cardiovascular:  Positive for chest pain.  All other systems reviewed and are negative.   Physical Exam Updated Vital Signs BP 119/79 (BP Location: Right Arm)   Pulse 84   Temp 98.5 F (36.9 C) (Oral)    Resp 19   SpO2 100%  Physical Exam Vitals and nursing note reviewed.  Constitutional:      General: He is not in acute distress.    Appearance: He is well-developed.  HENT:     Head: Atraumatic.  Eyes:     Conjunctiva/sclera: Conjunctivae normal.  Musculoskeletal:     Cervical back: Neck supple.  Skin:    Findings: No rash.  Neurological:     Mental Status: He is alert.     ED Results / Procedures / Treatments   Labs (all labs ordered are listed, but only abnormal results are displayed) Labs Reviewed  URINALYSIS, ROUTINE W REFLEX MICROSCOPIC - Abnormal; Notable for the following components:      Result Value   Glucose, UA 50 (*)    Hgb urine dipstick MODERATE (*)    Protein, ur >=300 (*)    All other components within normal limits  I-STAT CHEM 8, ED - Abnormal; Notable for the following components:   Chloride 113 (*)    BUN 52 (*)    Creatinine, Ser 4.40 (*)    Glucose, Bld 140 (*)    Calcium, Ion 0.91 (*)    TCO2 18 (*)    Hemoglobin 9.2 (*)    HCT 27.0 (*)    All other components within normal limits    EKG None  Radiology No results found.  Procedures Procedures  {Document cardiac monitor, telemetry assessment procedure when appropriate:1}  Medications Ordered in ED Medications - No data to display  ED Course/ Medical Decision Making/ A&P   {   Click here for ABCD2, HEART and other calculatorsREFRESH Note before signing :1}                          Medical Decision Making Amount and/or Complexity of Data Reviewed Labs: ordered.   BP 119/79 (BP Location: Right Arm)   Pulse 84   Temp 98.5 F (36.9 C) (Oral)   Resp 19   SpO2 100%   60:67 PM 72 year old male with history of poorly controlled diabetes, cardiac disease, HIV, hypertension, polysubstance use including cocaine use, hepatitis C, chronic kidney disease presenting today with complaints of flank pain.  Patient mention he has had pain to his left flank for the past several months since  he was told that he has kidney problem.  Pain has not gotten any worse but he does voice concern.  He also endorsed chest pain and having suicidal ideations.  Chest pain is unchanged since he had his CABG a year ago.  Pain is persistent with some trouble breathing which is unchanged.  He also endorsed intermittent episodes of suicidal ideation without any specific plan.  He was seen in the ED earlier this morning for the same  complaint and was discharged home.  He returns with essentially the same complaint.    {Document critical care time when appropriate:1} {Document review of labs and clinical decision tools ie heart score, Chads2Vasc2 etc:1}  {Document your independent review of radiology images, and any outside records:1} {Document your discussion with family members, caretakers, and with consultants:1} {Document social determinants of health affecting pt's care:1} {Document your decision making why or why not admission, treatments were needed:1} Final Clinical Impression(s) / ED Diagnoses Final diagnoses:  None    Rx / DC Orders ED Discharge Orders     None

## 2023-01-08 NOTE — Discharge Instructions (Signed)
[facility] Address: [address], [place], [place] [address] Phone: [phone]  Supported Employment The supported employment program is a person-centered, individualized, evidence-based support service that helps members choose, acquire, and maintain competitive employment in our community. This service supports the varying needs of individuals and promotes community inclusion and employment success. Members enrolled in the supported employment program can expect the following:  Development of an individual career plan Community based job placement Job shadowing Job development On-site job training Educational goal planning and support  Supported Education Supported education helps our members receive the education and training they need to achieve their learning and recovery goals. This will assist members with becoming gainfully employed in the job or career of their choice. The program includes assistance with: Registering for disability accommodations Enrolling in school and registering for classes Learning communication skills Scheduling tutoring sessions within your school [facility] partners with [employer] to help increase the success of clients seeking employment and educational goals.  Want to learn more about our programs?   Please contact our intake department INTAKE: [phone] Ext [phone]  Mailing: [address] [address]   [place], [place] [address]   [email or web address]             Substance Abuse Treatment Programs  Intensive Outpatient Programs [facility]     [address]      [place], [place]                   [phone]       [facility] [address] [name], [place] [phone]  [facility]     (Inpatient and outpatient)     [address]           [phone]    [facility] [phone] (Suboxone and Methadone)  [address]      [place], [place] [address]      [phone]       [address] [place], [place] [phone]  [facility] (Outpatient/Inpatient, [place])    (insurance only) [phone]             [facility] (Groups & Residential) [place], [place] [phone]     [facility]     [address]     [place], [place]      [phone]       [facility] (for caregivers and family) [address] [place], [place] [phone]      [facility]      [address] [place], [place] [address]  [phone]       [facility] [address] [place], [place] [address] [phone]  [facility]        [phone]       [facility] [phone]  [facility]             [address]                                         [place], [place]                                                  [phone] or [phone]                               [facility] [place] [address] [place], [address] [phone]  [facility]    [address]      [place], [place]     [phone]       [facility] [address] [place], [place] [phone]  [facility]   [address]     [place], [place] [address]     [phone]      Admissions: 8am-3pm M-F  [facility] ([facility] [address] [place], [place] [phone]  [facility]: Residential Program (90 Days)   [place], [place]      [phone] or [phone]     [facility]: [facility] [place], [place] (Walk in Hours over the weekend or by referral)  [facility] [address], [place], [place] [address] [phone]  Crisis Mobile: [facility]:  1-877-626-1772 (for crisis response 24 hours a day) [facility] Hotline:      1-800-256-2452 Outpatient Psychiatry and  Counseling  [facility]: Mobile Crisis Management 24 hours:  1-877-626-1772  [facility] sliding scale fee and walk in schedule: M-F 8am-12pm/1pm-3pm [address]  [place], [place] [address] [phone]  [facility] [address] [place], [place] [address] [phone]  [facility] ([facility] new patient walk-in appointments available Monday - Friday 8am -3pm.          [address] [name], [place] [address] [phone] or crisis line- [phone]  [facility]/ Intensive Outpatient Therapy Program [address] [place], [place] [address] [phone]  [facility]                  Crisis Services      [phone]      [address]     [place], [place] [address]                 [facility]   [facility] [phone] [address] [place], [place] [address]   [facility]          [address],  [place], [place] [address]       [phone]  [facility] [address] [place], [place] [address] [phone]  [facility]    [address]    [place], [place] [address]     [phone]       Parish McKinney, MD     [address]     [place] [address]     [phone]       [facility] [address] [place] [address]  [facility]     [address]     [place], [place]      [phone]       [facility] Shamsher Ahluwalia, MD [address] [place], [place] [address] [phone]  [facility]     [address]     [place], [place] [address]     [phone]       [facility] [address] [place],  [address] [phone] Resources for Temporary   Residential Assistance/Crisis  Centers  [facility] M-F 8am-3pm   [address] [place], [place] [address]   [phone] Services include: laundry, barbering, support groups, case management, phone  & computer access, showers, AA/NA mtgs, mental health/substance abuse nurse, job skills class, disability information, VA assistance, spiritual classes, etc.   HOMELESS SHELTERS  [facility]     [facility]   [address], [place]     [phone]              [facility] (women and children)       [address] [place], [place] [address] [phone] [email or web address] for application and process Application Required  [facility]   [address]    [place] [address]     [phone]                    [facility] [address] [place], [place] [address] [phone](schedule application appt.) Application Required  [facility] (women only)    [address]     [place], [place] [address]     [phone]      Intake starts 6pm daily Need valid ID, SSC, & Police report [facility] [place] [address] [place], [place] [phone] Application Required  [facility] (men only)     [address]      [place], [place]     [phone]       [facility] (Pregnant women only) [address] [place], [place] [phone]  [facility]      [address]      [place], [place] [address]     [phone]             [facility] [facility] [address] [place], [place] [phone] 90 day commitment/SA/Application process  [facility](men only)     [address]     [place], [place]     [phone]       Check-in at 7pm            [facility] [address] [place], [place] [address] [phone] Men/Women/Women and Children must be there by 7  pm  [facility] [place], [place] [phone]                 
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
## 2023-01-08 NOTE — ED Provider Triage Note (Signed)
Emergency Medicine Provider Triage Evaluation Note  Trevour Huibregtse , a 72 y.o. male  was evaluated in triage.  Pt complains of kidney pain. L flank pain ongoing for several months "from my kidney".  Was seen earlier today for chronic CP and SI. Was evaluated and cleared from both complaint.  No new changes  Review of Systems  Positive: As above Negative: As above  Physical Exam  BP 119/79 (BP Location: Right Arm)   Pulse 84   Temp 98.5 F (36.9 C) (Oral)   Resp 19   SpO2 100%  Gen:   Awake, no distress   Resp:  Normal effort  MSK:   Moves extremities without difficulty  Other:    Medical Decision Making  Medically screening exam initiated at 10:31 PM.  Appropriate orders placed.  [name] was informed that the remainder of the evaluation will be completed by another provider, this initial triage assessment does not replace that evaluation, and the importance of remaining in the ED until their evaluation is complete.     Domenic Moras, PA-C 01/08/23 2232

## 2023-01-08 NOTE — Progress Notes (Signed)
TOC consulted to provide SA resources. Resources provided and attached to AVS. No further TOC needs at this time.

## 2023-01-09 ENCOUNTER — Emergency Department (HOSPITAL_COMMUNITY): Payer: 59

## 2023-01-09 LAB — BASIC METABOLIC PANEL
Anion gap: 6 (ref 5–15)
BUN: 53 mg/dL — ABNORMAL HIGH (ref 8–23)
CO2: 19 mmol/L — ABNORMAL LOW (ref 22–32)
Calcium: 7.7 mg/dL — ABNORMAL LOW (ref 8.9–10.3)
Chloride: 112 mmol/L — ABNORMAL HIGH (ref 98–111)
Creatinine, Ser: 3.41 mg/dL — ABNORMAL HIGH (ref 0.61–1.24)
GFR, Estimated: 18 mL/min — ABNORMAL LOW (ref >60)
Glucose, Bld: 97 mg/dL (ref 70–99)
Potassium: 4.2 mmol/L (ref 3.5–5.1)
Sodium: 137 mmol/L (ref 135–145)

## 2023-01-09 LAB — CBC WITH DIFFERENTIAL/PLATELET
Abs Immature Granulocytes: 0.01 10*3/uL (ref 0.00–0.07)
Basophils Absolute: 0 10*3/uL (ref 0.0–0.1)
Basophils Relative: 1 %
Eosinophils Absolute: 0.1 10*3/uL (ref 0.0–0.5)
Eosinophils Relative: 3 %
HCT: 25.9 % — ABNORMAL LOW (ref 39.0–52.0)
Hemoglobin: 8.8 g/dL — ABNORMAL LOW (ref 13.0–17.0)
Immature Granulocytes: 0 %
Lymphocytes Relative: 34 %
Lymphs Abs: 1.2 10*3/uL (ref 0.7–4.0)
MCH: 29.1 pg (ref 26.0–34.0)
MCHC: 34 g/dL (ref 30.0–36.0)
MCV: 85.8 fL (ref 80.0–100.0)
Monocytes Absolute: 0.4 10*3/uL (ref 0.1–1.0)
Monocytes Relative: 12 %
Neutro Abs: 1.7 10*3/uL (ref 1.7–7.7)
Neutrophils Relative %: 50 %
Platelets: 117 10*3/uL — ABNORMAL LOW (ref 150–400)
RBC: 3.02 MIL/uL — ABNORMAL LOW (ref 4.22–5.81)
RDW: 13.2 % (ref 11.5–15.5)
WBC: 3.4 10*3/uL — ABNORMAL LOW (ref 4.0–10.5)
nRBC: 0 % (ref 0.0–0.2)

## 2023-01-09 LAB — RAPID URINE DRUG SCREEN, HOSP PERFORMED
Amphetamines: NOT DETECTED
Barbiturates: NOT DETECTED
Benzodiazepines: NOT DETECTED
Cocaine: POSITIVE — AB
Opiates: NOT DETECTED
Tetrahydrocannabinol: NOT DETECTED

## 2023-01-09 LAB — TROPONIN I (HIGH SENSITIVITY)
Troponin I (High Sensitivity): 18 ng/L — ABNORMAL HIGH (ref <18)
Troponin I (High Sensitivity): 19 ng/L — ABNORMAL HIGH (ref <18)

## 2023-01-09 MED ORDER — SODIUM CHLORIDE 0.9 % IV BOLUS
1000.0000 mL | Freq: Once | INTRAVENOUS | Status: AC
  Administered 2023-01-09: 1000 mL via INTRAVENOUS

## 2023-01-09 NOTE — Discharge Instructions (Signed)
You have been evaluated for your symptoms.  Fortunately no concerning findings were noted on today's exam.  Please avoid cocaine use as it is negatively affecting your health.  Follow-up with your doctor for further care.

## 2023-01-12 ENCOUNTER — Encounter (HOSPITAL_COMMUNITY): Payer: Self-pay | Admitting: Emergency Medicine

## 2023-01-12 ENCOUNTER — Emergency Department (HOSPITAL_COMMUNITY)
Admission: EM | Admit: 2023-01-12 | Discharge: 2023-01-12 | Disposition: A | Discharge status: Home / Self Care | Payer: 59 | Attending: Emergency Medicine | Admitting: Emergency Medicine

## 2023-01-12 ENCOUNTER — Other Ambulatory Visit: Payer: Self-pay

## 2023-01-12 ENCOUNTER — Emergency Department (HOSPITAL_COMMUNITY): Payer: 59

## 2023-01-12 DIAGNOSIS — Z79899 Other long term (current) drug therapy: Secondary | ICD-10-CM | POA: Insufficient documentation

## 2023-01-12 DIAGNOSIS — Z21 Asymptomatic human immunodeficiency virus [HIV] infection status: Secondary | ICD-10-CM | POA: Insufficient documentation

## 2023-01-12 DIAGNOSIS — I2510 Atherosclerotic heart disease of native coronary artery without angina pectoris: Secondary | ICD-10-CM | POA: Insufficient documentation

## 2023-01-12 DIAGNOSIS — R079 Chest pain, unspecified: Secondary | ICD-10-CM | POA: Diagnosis present

## 2023-01-12 DIAGNOSIS — Z951 Presence of aortocoronary bypass graft: Secondary | ICD-10-CM | POA: Diagnosis not present

## 2023-01-12 DIAGNOSIS — I10 Essential (primary) hypertension: Secondary | ICD-10-CM | POA: Insufficient documentation

## 2023-01-12 LAB — CBC
HCT: 29.1 % — ABNORMAL LOW (ref 39.0–52.0)
Hemoglobin: 9.8 g/dL — ABNORMAL LOW (ref 13.0–17.0)
MCH: 29 pg (ref 26.0–34.0)
MCHC: 33.7 g/dL (ref 30.0–36.0)
MCV: 86.1 fL (ref 80.0–100.0)
Platelets: 119 10*3/uL — ABNORMAL LOW (ref 150–400)
RBC: 3.38 MIL/uL — ABNORMAL LOW (ref 4.22–5.81)
RDW: 13.2 % (ref 11.5–15.5)
WBC: 3.5 10*3/uL — ABNORMAL LOW (ref 4.0–10.5)
nRBC: 0 % (ref 0.0–0.2)

## 2023-01-12 LAB — BASIC METABOLIC PANEL
Anion gap: 11 (ref 5–15)
BUN: 38 mg/dL — ABNORMAL HIGH (ref 8–23)
CO2: 15 mmol/L — ABNORMAL LOW (ref 22–32)
Calcium: 8.7 mg/dL — ABNORMAL LOW (ref 8.9–10.3)
Chloride: 115 mmol/L — ABNORMAL HIGH (ref 98–111)
Creatinine, Ser: 3.55 mg/dL — ABNORMAL HIGH (ref 0.61–1.24)
GFR, Estimated: 17 mL/min — ABNORMAL LOW (ref >60)
Glucose, Bld: 86 mg/dL (ref 70–99)
Potassium: 4.4 mmol/L (ref 3.5–5.1)
Sodium: 141 mmol/L (ref 135–145)

## 2023-01-12 LAB — TROPONIN I (HIGH SENSITIVITY)
Troponin I (High Sensitivity): 24 ng/L — ABNORMAL HIGH (ref <18)
Troponin I (High Sensitivity): 24 ng/L — ABNORMAL HIGH (ref <18)

## 2023-01-12 MED ORDER — SODIUM CHLORIDE 0.9 % IV BOLUS
1000.0000 mL | Freq: Once | INTRAVENOUS | Status: AC
  Administered 2023-01-12: 1000 mL via INTRAVENOUS

## 2023-01-12 MED ORDER — DILTIAZEM HCL ER COATED BEADS 120 MG PO CP24
120.0000 mg | ORAL_CAPSULE | Freq: Once | ORAL | Status: AC
  Administered 2023-01-12: 120 mg via ORAL
  Filled 2023-01-12 (×2): qty 1

## 2023-01-12 MED ORDER — DILTIAZEM HCL ER COATED BEADS 120 MG PO CP24
120.0000 mg | ORAL_CAPSULE | Freq: Every day | ORAL | 0 refills | Status: DC
  Filled 2023-01-12: qty 30, 30d supply, fill #0

## 2023-01-12 NOTE — ED Triage Notes (Signed)
Per [facility] pt coming from hotel c/o sharp left sided chest pain onset of this morning. Admits to crack use this morning. Given 324 aspirin en route.

## 2023-01-12 NOTE — ED Provider Notes (Signed)
[place] [facility]GENCY DEPARTMENT AT [facility]  Provider Note   CSN: [id number] Arrival date & time: 01/12/23  1130     History {Add pertinent medical, surgical, social history, OB history to HPI:1} Chief Complaint  Patient presents with   Chest Pain    Michael [name] is a 72 y.o. male.  He has a history of HIV, A-fib not on anticoagulation, coronary disease and CABG, PE.  He is brought in by EMS from his motel room with complaint of sharp left-sided chest pain that is been going on for over a week.  On further questioning he said he is actually had chest pain pretty much daily since he had his bypass surgery.  He does endorse crack cocaine last use was this morning.  He has multiple ED visits for similar presentations along with ongoing mental health issues.  He denies any cough abdominal pain vomiting diarrhea fevers chills.  He denies smoking tobacco or using other drugs.  He does not feel like he has been drinking enough fluids.  The history is provided by the patient.  Chest Pain Pain location:  L chest and substernal area Pain quality: sharp and stabbing   Onset quality:  Sudden Timing:  Intermittent Progression:  Unchanged Chronicity:  Chronic Relieved by:  None tried Worsened by:  Nothing Ineffective treatments:  None tried Associated symptoms: no abdominal pain, no cough, no diaphoresis, no fever, no nausea, no shortness of breath and no vomiting   Risk factors: coronary artery disease and prior DVT/PE        Home Medications Prior to Admission medications   Medication Sig Start Date End Date Taking? Authorizing Provider  Accu-Chek Softclix Lancets lancets Use as directed up to four times daily 09/06/22   Zigmund DanielPowell, A Caldwell Jr., MD  albuterol (VENTOLIN HFA) 108 (90 Base) MCG/ACT inhaler Inhale 1-2 puffs into the lungs every 6 (six) hours as needed for wheezing or shortness of breath. 10/24/22   [place], Eric J, DO  atorvastatin (LIPITOR) 40 MG tablet Take 1  tablet (40 mg total) by mouth daily. 12/09/22 04/08/23  Sarina IllHerrick, Richard Edward, DO  Blood Glucose Monitoring Suppl (ACCU-CHEK GUIDE) w/Device KIT Use as directed. 09/06/22   Zigmund DanielPowell, A Caldwell Jr., MD  diltiazem (CARDIZEM CD) 120 MG 24 hr capsule Take 1 capsule (120 mg total) by mouth daily. 12/09/22 01/08/23  Sarina IllHerrick, Richard Edward, DO  ferrous sulfate 325 (65 FE) MG tablet Take 1 tablet (325 mg total) by mouth daily with breakfast. 12/17/22   Regalado, Belkys A, MD  FLUoxetine (PROZAC) 20 MG capsule Take 1 capsule (20 mg total) by mouth daily. 12/17/22   Regalado, Belkys A, MD  fluticasone furoate-vilanterol (BREO ELLIPTA) 200-25 MCG/ACT AEPB Inhale 1 puff into the lungs daily. 12/10/22   Sarina IllHerrick, Richard Edward, DO  folic acid (FOLVITE) 1 MG tablet Take 1 tablet (1 mg total) by mouth daily. 12/17/22   Regalado, Belkys A, MD  glucose blood test strip use as directed up to four times daily 09/06/22   Zigmund DanielPowell, A Caldwell Jr., MD  Insulin Pen Needle 32G X 4 MM MISC Use as directed up to 4 times daily. 09/06/22   Zigmund DanielPowell, A Caldwell Jr., MD  isosorbide mononitrate (IMDUR) 30 MG 24 hr tablet Take 0.5 tablets (15 mg total) by mouth daily. 12/10/22   Sarina IllHerrick, Richard Edward, DO  nitroGLYCERIN (NITROSTAT) 0.4 MG SL tablet Place 1 tablet (0.4 mg total) under the tongue every 5 (five) minutes as needed for chest pain. 12/09/22  Sarina Ill, DO  pantoprazole (PROTONIX) 40 MG tablet Take 1 tablet (40 mg total) by mouth daily. 12/17/22   Regalado, Belkys A, MD  polyethylene glycol (MIRALAX / GLYCOLAX) 17 g packet Take 17 g by mouth 2 (two) times daily. 12/17/22   Regalado, Belkys A, MD  QUEtiapine (SEROQUEL) 50 MG tablet Take 1 tablet (50 mg total) by mouth at bedtime. 12/09/22   Sarina Ill, DO  sodium bicarbonate 650 MG tablet Take 1 tablet (650 mg total) by mouth 3 (three) times daily. 12/16/22   Regalado, Prentiss Bells, MD      Allergies    Patient has no known allergies.    Review of Systems   Review of  Systems  Constitutional:  Negative for diaphoresis and fever.  Eyes:  Negative for visual disturbance.  Respiratory:  Negative for cough and shortness of breath.   Cardiovascular:  Positive for chest pain.  Gastrointestinal:  Negative for abdominal pain, nausea and vomiting.    Physical Exam Updated Vital Signs BP (!) 159/91   Pulse 75   Temp 98.9 F (37.2 C) (Oral)   Resp 20   Ht 5\' 8"  (1.727 m)   Wt 65.8 kg   SpO2 100%   BMI 22.05 kg/m  Physical Exam Vitals and nursing note reviewed.  Constitutional:      General: He is not in acute distress.    Appearance: He is well-developed.  HENT:     Head: Normocephalic and atraumatic.  Eyes:     Conjunctiva/sclera: Conjunctivae normal.  Cardiovascular:     Rate and Rhythm: Normal rate and regular rhythm.     Heart sounds: Normal heart sounds. No murmur heard. Pulmonary:     Effort: Pulmonary effort is normal. No respiratory distress.     Breath sounds: Normal breath sounds.  Abdominal:     Palpations: Abdomen is soft.     Tenderness: There is no abdominal tenderness.  Musculoskeletal:        General: No swelling.     Cervical back: Neck supple.     Right lower leg: No tenderness. No edema.     Left lower leg: No tenderness. No edema.  Skin:    General: Skin is warm and dry.     Capillary Refill: Capillary refill takes less than 2 seconds.  Neurological:     General: No focal deficit present.     Mental Status: He is alert.  Psychiatric:        Mood and Affect: Mood normal.     ED Results / Procedures / Treatments   Labs (all labs ordered are listed, but only abnormal results are displayed) Labs Reviewed  BASIC METABOLIC PANEL  CBC  RAPID URINE DRUG SCREEN, HOSP PERFORMED  TROPONIN I (HIGH SENSITIVITY)    EKG None  Radiology No results found.  Procedures Procedures  {Document cardiac monitor, telemetry assessment procedure when appropriate:1}  Medications Ordered in ED Medications  sodium chloride  0.9 % bolus 1,000 mL (has no administration in time range)    ED Course/ Medical Decision Making/ A&P   {   Click here for ABCD2, HEART and other calculatorsREFRESH Note before signing :1}                          Medical Decision Making Amount and/or Complexity of Data Reviewed Labs: ordered. Radiology: ordered.   This patient complains of ***; this involves an extensive number of treatment Options and is  a complaint that carries with it a high risk of complications and morbidity. The differential includes ***  I ordered, reviewed and interpreted labs, which included *** I ordered medication *** and reviewed PMP when indicated. I ordered imaging studies which included *** and I independently    visualized and interpreted imaging which showed *** Additional history obtained from *** Previous records obtained and reviewed *** I consulted *** and discussed lab and imaging findings and discussed disposition.  Cardiac monitoring reviewed, *** Social determinants considered, *** Critical Interventions: ***  After the interventions stated above, I reevaluated the patient and found *** Admission and further testing considered, ***   {Document critical care time when appropriate:1} {Document review of labs and clinical decision tools ie heart score, Chads2Vasc2 etc:1}  {Document your independent review of radiology images, and any outside records:1} {Document your discussion with family members, caretakers, and with consultants:1} {Document social determinants of health affecting pt's care:1} {Document your decision making why or why not admission, treatments were needed:1} Final Clinical Impression(s) / ED Diagnoses Final diagnoses:  None    Rx / DC Orders ED Discharge Orders     None

## 2023-01-13 ENCOUNTER — Other Ambulatory Visit (HOSPITAL_COMMUNITY): Payer: Self-pay

## 2023-01-16 ENCOUNTER — Emergency Department (HOSPITAL_COMMUNITY)
Admission: EM | Admit: 2023-01-16 | Discharge: 2023-01-17 | Disposition: A | Discharge status: Home / Self Care | Payer: 59 | Source: Home / Self Care | Attending: Emergency Medicine | Admitting: Emergency Medicine

## 2023-01-16 ENCOUNTER — Other Ambulatory Visit: Payer: Self-pay

## 2023-01-16 ENCOUNTER — Emergency Department (HOSPITAL_COMMUNITY): Payer: 59

## 2023-01-16 ENCOUNTER — Encounter (HOSPITAL_COMMUNITY): Payer: Self-pay

## 2023-01-16 DIAGNOSIS — I129 Hypertensive chronic kidney disease with stage 1 through stage 4 chronic kidney disease, or unspecified chronic kidney disease: Secondary | ICD-10-CM | POA: Insufficient documentation

## 2023-01-16 DIAGNOSIS — Z951 Presence of aortocoronary bypass graft: Secondary | ICD-10-CM | POA: Diagnosis not present

## 2023-01-16 DIAGNOSIS — R079 Chest pain, unspecified: Secondary | ICD-10-CM

## 2023-01-16 DIAGNOSIS — K859 Acute pancreatitis without necrosis or infection, unspecified: Secondary | ICD-10-CM | POA: Diagnosis not present

## 2023-01-16 DIAGNOSIS — D631 Anemia in chronic kidney disease: Secondary | ICD-10-CM

## 2023-01-16 DIAGNOSIS — E872 Acidosis, unspecified: Secondary | ICD-10-CM

## 2023-01-16 DIAGNOSIS — N184 Chronic kidney disease, stage 4 (severe): Secondary | ICD-10-CM | POA: Diagnosis not present

## 2023-01-16 DIAGNOSIS — Z794 Long term (current) use of insulin: Secondary | ICD-10-CM | POA: Insufficient documentation

## 2023-01-16 DIAGNOSIS — D696 Thrombocytopenia, unspecified: Secondary | ICD-10-CM | POA: Insufficient documentation

## 2023-01-16 DIAGNOSIS — R55 Syncope and collapse: Secondary | ICD-10-CM | POA: Diagnosis present

## 2023-01-16 DIAGNOSIS — Z89421 Acquired absence of other right toe(s): Secondary | ICD-10-CM | POA: Diagnosis not present

## 2023-01-16 DIAGNOSIS — I5030 Unspecified diastolic (congestive) heart failure: Secondary | ICD-10-CM | POA: Diagnosis not present

## 2023-01-16 DIAGNOSIS — R778 Other specified abnormalities of plasma proteins: Secondary | ICD-10-CM | POA: Insufficient documentation

## 2023-01-16 DIAGNOSIS — I480 Paroxysmal atrial fibrillation: Secondary | ICD-10-CM | POA: Diagnosis not present

## 2023-01-16 DIAGNOSIS — Z79899 Other long term (current) drug therapy: Secondary | ICD-10-CM | POA: Diagnosis not present

## 2023-01-16 DIAGNOSIS — Z5900 Homelessness unspecified: Secondary | ICD-10-CM

## 2023-01-16 DIAGNOSIS — E1122 Type 2 diabetes mellitus with diabetic chronic kidney disease: Secondary | ICD-10-CM | POA: Diagnosis not present

## 2023-01-16 DIAGNOSIS — D72819 Decreased white blood cell count, unspecified: Secondary | ICD-10-CM | POA: Insufficient documentation

## 2023-01-16 DIAGNOSIS — N2889 Other specified disorders of kidney and ureter: Secondary | ICD-10-CM | POA: Insufficient documentation

## 2023-01-16 DIAGNOSIS — N289 Disorder of kidney and ureter, unspecified: Secondary | ICD-10-CM

## 2023-01-16 DIAGNOSIS — Z86711 Personal history of pulmonary embolism: Secondary | ICD-10-CM | POA: Diagnosis not present

## 2023-01-16 DIAGNOSIS — I130 Hypertensive heart and chronic kidney disease with heart failure and stage 1 through stage 4 chronic kidney disease, or unspecified chronic kidney disease: Secondary | ICD-10-CM | POA: Diagnosis not present

## 2023-01-16 DIAGNOSIS — J45909 Unspecified asthma, uncomplicated: Secondary | ICD-10-CM | POA: Diagnosis not present

## 2023-01-16 DIAGNOSIS — E875 Hyperkalemia: Secondary | ICD-10-CM | POA: Diagnosis not present

## 2023-01-16 DIAGNOSIS — I2510 Atherosclerotic heart disease of native coronary artery without angina pectoris: Secondary | ICD-10-CM | POA: Insufficient documentation

## 2023-01-16 DIAGNOSIS — E119 Type 2 diabetes mellitus without complications: Secondary | ICD-10-CM | POA: Insufficient documentation

## 2023-01-16 DIAGNOSIS — F1410 Cocaine abuse, uncomplicated: Secondary | ICD-10-CM | POA: Insufficient documentation

## 2023-01-16 DIAGNOSIS — I10 Essential (primary) hypertension: Secondary | ICD-10-CM

## 2023-01-16 DIAGNOSIS — R7989 Other specified abnormal findings of blood chemistry: Secondary | ICD-10-CM

## 2023-01-16 DIAGNOSIS — Z955 Presence of coronary angioplasty implant and graft: Secondary | ICD-10-CM | POA: Diagnosis not present

## 2023-01-16 DIAGNOSIS — N189 Chronic kidney disease, unspecified: Secondary | ICD-10-CM | POA: Insufficient documentation

## 2023-01-16 NOTE — ED Triage Notes (Signed)
Pt coming from [facility], for chest pain that started yesterday after smoking crack cocaine. Said the pain is in the left arm and left chest which is sharp in nature. EMS gave 4 baby aspirin and 1 nitro 0.4mg 

## 2023-01-17 ENCOUNTER — Encounter (HOSPITAL_COMMUNITY): Payer: Self-pay | Admitting: Internal Medicine

## 2023-01-17 ENCOUNTER — Other Ambulatory Visit: Payer: Self-pay

## 2023-01-17 ENCOUNTER — Observation Stay (HOSPITAL_COMMUNITY)
Admission: EM | Admit: 2023-01-17 | Discharge: 2023-01-25 | Disposition: A | Discharge status: Home / Self Care | Payer: 59 | Attending: Internal Medicine | Admitting: Internal Medicine

## 2023-01-17 ENCOUNTER — Emergency Department (HOSPITAL_COMMUNITY): Payer: 59

## 2023-01-17 DIAGNOSIS — I480 Paroxysmal atrial fibrillation: Secondary | ICD-10-CM | POA: Insufficient documentation

## 2023-01-17 DIAGNOSIS — F1910 Other psychoactive substance abuse, uncomplicated: Secondary | ICD-10-CM | POA: Diagnosis present

## 2023-01-17 DIAGNOSIS — Z5900 Homelessness unspecified: Secondary | ICD-10-CM

## 2023-01-17 DIAGNOSIS — E875 Hyperkalemia: Secondary | ICD-10-CM | POA: Insufficient documentation

## 2023-01-17 DIAGNOSIS — Z86711 Personal history of pulmonary embolism: Secondary | ICD-10-CM | POA: Insufficient documentation

## 2023-01-17 DIAGNOSIS — N183 Chronic kidney disease, stage 3 unspecified: Secondary | ICD-10-CM | POA: Diagnosis present

## 2023-01-17 DIAGNOSIS — Z794 Long term (current) use of insulin: Secondary | ICD-10-CM | POA: Insufficient documentation

## 2023-01-17 DIAGNOSIS — J45909 Unspecified asthma, uncomplicated: Secondary | ICD-10-CM | POA: Insufficient documentation

## 2023-01-17 DIAGNOSIS — Z951 Presence of aortocoronary bypass graft: Secondary | ICD-10-CM | POA: Insufficient documentation

## 2023-01-17 DIAGNOSIS — K8500 Idiopathic acute pancreatitis without necrosis or infection: Secondary | ICD-10-CM | POA: Diagnosis not present

## 2023-01-17 DIAGNOSIS — D696 Thrombocytopenia, unspecified: Secondary | ICD-10-CM | POA: Insufficient documentation

## 2023-01-17 DIAGNOSIS — I2510 Atherosclerotic heart disease of native coronary artery without angina pectoris: Secondary | ICD-10-CM | POA: Insufficient documentation

## 2023-01-17 DIAGNOSIS — D72819 Decreased white blood cell count, unspecified: Secondary | ICD-10-CM | POA: Insufficient documentation

## 2023-01-17 DIAGNOSIS — F1410 Cocaine abuse, uncomplicated: Secondary | ICD-10-CM | POA: Insufficient documentation

## 2023-01-17 DIAGNOSIS — Z89421 Acquired absence of other right toe(s): Secondary | ICD-10-CM | POA: Insufficient documentation

## 2023-01-17 DIAGNOSIS — N2889 Other specified disorders of kidney and ureter: Secondary | ICD-10-CM | POA: Insufficient documentation

## 2023-01-17 DIAGNOSIS — R778 Other specified abnormalities of plasma proteins: Secondary | ICD-10-CM | POA: Insufficient documentation

## 2023-01-17 DIAGNOSIS — D638 Anemia in other chronic diseases classified elsewhere: Secondary | ICD-10-CM | POA: Diagnosis present

## 2023-01-17 DIAGNOSIS — B20 Human immunodeficiency virus [HIV] disease: Secondary | ICD-10-CM | POA: Insufficient documentation

## 2023-01-17 DIAGNOSIS — I130 Hypertensive heart and chronic kidney disease with heart failure and stage 1 through stage 4 chronic kidney disease, or unspecified chronic kidney disease: Secondary | ICD-10-CM | POA: Insufficient documentation

## 2023-01-17 DIAGNOSIS — K219 Gastro-esophageal reflux disease without esophagitis: Secondary | ICD-10-CM | POA: Diagnosis present

## 2023-01-17 DIAGNOSIS — Z955 Presence of coronary angioplasty implant and graft: Secondary | ICD-10-CM | POA: Insufficient documentation

## 2023-01-17 DIAGNOSIS — I10 Essential (primary) hypertension: Secondary | ICD-10-CM | POA: Diagnosis present

## 2023-01-17 DIAGNOSIS — N184 Chronic kidney disease, stage 4 (severe): Secondary | ICD-10-CM | POA: Insufficient documentation

## 2023-01-17 DIAGNOSIS — D631 Anemia in chronic kidney disease: Secondary | ICD-10-CM | POA: Insufficient documentation

## 2023-01-17 DIAGNOSIS — K859 Acute pancreatitis without necrosis or infection, unspecified: Principal | ICD-10-CM | POA: Diagnosis present

## 2023-01-17 DIAGNOSIS — Z79899 Other long term (current) drug therapy: Secondary | ICD-10-CM | POA: Insufficient documentation

## 2023-01-17 DIAGNOSIS — E1122 Type 2 diabetes mellitus with diabetic chronic kidney disease: Secondary | ICD-10-CM | POA: Insufficient documentation

## 2023-01-17 DIAGNOSIS — I5030 Unspecified diastolic (congestive) heart failure: Secondary | ICD-10-CM | POA: Diagnosis present

## 2023-01-17 DIAGNOSIS — E119 Type 2 diabetes mellitus without complications: Secondary | ICD-10-CM

## 2023-01-17 DIAGNOSIS — E872 Acidosis, unspecified: Secondary | ICD-10-CM | POA: Insufficient documentation

## 2023-01-17 LAB — CBC
HCT: 27.6 % — ABNORMAL LOW (ref 39.0–52.0)
HCT: 28.3 % — ABNORMAL LOW (ref 39.0–52.0)
Hemoglobin: 9.1 g/dL — ABNORMAL LOW (ref 13.0–17.0)
Hemoglobin: 9.1 g/dL — ABNORMAL LOW (ref 13.0–17.0)
MCH: 28.7 pg (ref 26.0–34.0)
MCH: 28.3 pg (ref 26.0–34.0)
MCHC: 33 g/dL (ref 30.0–36.0)
MCHC: 32.2 g/dL (ref 30.0–36.0)
MCV: 87.1 fL (ref 80.0–100.0)
MCV: 88.2 fL (ref 80.0–100.0)
Platelets: 149 10*3/uL — ABNORMAL LOW (ref 150–400)
Platelets: 124 10*3/uL — ABNORMAL LOW (ref 150–400)
RBC: 3.17 MIL/uL — ABNORMAL LOW (ref 4.22–5.81)
RBC: 3.21 MIL/uL — ABNORMAL LOW (ref 4.22–5.81)
RDW: 13.7 % (ref 11.5–15.5)
RDW: 13.8 % (ref 11.5–15.5)
WBC: 3.9 10*3/uL — ABNORMAL LOW (ref 4.0–10.5)
WBC: 7.1 10*3/uL (ref 4.0–10.5)
nRBC: 0 % (ref 0.0–0.2)
nRBC: 0 % (ref 0.0–0.2)

## 2023-01-17 LAB — CBC WITH DIFFERENTIAL/PLATELET
Abs Immature Granulocytes: 0.04 10*3/uL (ref 0.00–0.07)
Basophils Absolute: 0 10*3/uL (ref 0.0–0.1)
Basophils Relative: 0 %
Eosinophils Absolute: 0.1 10*3/uL (ref 0.0–0.5)
Eosinophils Relative: 1 %
HCT: 28.3 % — ABNORMAL LOW (ref 39.0–52.0)
Hemoglobin: 9 g/dL — ABNORMAL LOW (ref 13.0–17.0)
Immature Granulocytes: 1 %
Lymphocytes Relative: 4 %
Lymphs Abs: 0.2 10*3/uL — ABNORMAL LOW (ref 0.7–4.0)
MCH: 28 pg (ref 26.0–34.0)
MCHC: 31.8 g/dL (ref 30.0–36.0)
MCV: 88.2 fL (ref 80.0–100.0)
Monocytes Absolute: 0.3 10*3/uL (ref 0.1–1.0)
Monocytes Relative: 5 %
Neutro Abs: 5.3 10*3/uL (ref 1.7–7.7)
Neutrophils Relative %: 89 %
Platelets: 123 10*3/uL — ABNORMAL LOW (ref 150–400)
RBC: 3.21 MIL/uL — ABNORMAL LOW (ref 4.22–5.81)
RDW: 13.8 % (ref 11.5–15.5)
WBC: 5.9 10*3/uL (ref 4.0–10.5)
nRBC: 0 % (ref 0.0–0.2)

## 2023-01-17 LAB — BASIC METABOLIC PANEL
Anion gap: 9 (ref 5–15)
BUN: 59 mg/dL — ABNORMAL HIGH (ref 8–23)
CO2: 18 mmol/L — ABNORMAL LOW (ref 22–32)
Calcium: 8.4 mg/dL — ABNORMAL LOW (ref 8.9–10.3)
Chloride: 114 mmol/L — ABNORMAL HIGH (ref 98–111)
Creatinine, Ser: 3.4 mg/dL — ABNORMAL HIGH (ref 0.61–1.24)
GFR, Estimated: 18 mL/min — ABNORMAL LOW (ref >60)
Glucose, Bld: 143 mg/dL — ABNORMAL HIGH (ref 70–99)
Potassium: 4.6 mmol/L (ref 3.5–5.1)
Sodium: 141 mmol/L (ref 135–145)

## 2023-01-17 LAB — COMPREHENSIVE METABOLIC PANEL
ALT: 17 U/L (ref 0–44)
AST: 16 U/L (ref 15–41)
Albumin: 3.9 g/dL (ref 3.5–5.0)
Alkaline Phosphatase: 63 U/L (ref 38–126)
Anion gap: 8 (ref 5–15)
BUN: 70 mg/dL — ABNORMAL HIGH (ref 8–23)
CO2: 20 mmol/L — ABNORMAL LOW (ref 22–32)
Calcium: 8.5 mg/dL — ABNORMAL LOW (ref 8.9–10.3)
Chloride: 112 mmol/L — ABNORMAL HIGH (ref 98–111)
Creatinine, Ser: 3.58 mg/dL — ABNORMAL HIGH (ref 0.61–1.24)
GFR, Estimated: 17 mL/min — ABNORMAL LOW (ref >60)
Glucose, Bld: 153 mg/dL — ABNORMAL HIGH (ref 70–99)
Potassium: 4.4 mmol/L (ref 3.5–5.1)
Sodium: 140 mmol/L (ref 135–145)
Total Bilirubin: 0.4 mg/dL (ref 0.3–1.2)
Total Protein: 7.3 g/dL (ref 6.5–8.1)

## 2023-01-17 LAB — RAPID URINE DRUG SCREEN, HOSP PERFORMED
Amphetamines: NOT DETECTED
Barbiturates: NOT DETECTED
Benzodiazepines: NOT DETECTED
Cocaine: POSITIVE — AB
Opiates: NOT DETECTED
Tetrahydrocannabinol: NOT DETECTED

## 2023-01-17 LAB — LIPASE, BLOOD: Lipase: 261 U/L — ABNORMAL HIGH (ref 11–51)

## 2023-01-17 LAB — TSH: TSH: 1.949 u[IU]/mL (ref 0.350–4.500)

## 2023-01-17 LAB — CREATININE, SERUM
Creatinine, Ser: 3.29 mg/dL — ABNORMAL HIGH (ref 0.61–1.24)
GFR, Estimated: 19 mL/min — ABNORMAL LOW (ref >60)

## 2023-01-17 LAB — TROPONIN I (HIGH SENSITIVITY)
Troponin I (High Sensitivity): 24 ng/L — ABNORMAL HIGH (ref <18)
Troponin I (High Sensitivity): 21 ng/L — ABNORMAL HIGH (ref <18)

## 2023-01-17 LAB — TRIGLYCERIDES: Triglycerides: 54 mg/dL (ref <150)

## 2023-01-17 LAB — GLUCOSE, CAPILLARY
Glucose-Capillary: 138 mg/dL — ABNORMAL HIGH (ref 70–99)
Glucose-Capillary: 104 mg/dL — ABNORMAL HIGH (ref 70–99)
Glucose-Capillary: 137 mg/dL — ABNORMAL HIGH (ref 70–99)

## 2023-01-17 LAB — AMMONIA: Ammonia: <10 umol/L (ref 9–35)

## 2023-01-17 LAB — ETHANOL: Alcohol, Ethyl (B): <10 mg/dL (ref <10)

## 2023-01-17 MED ORDER — FLUTICASONE FUROATE-VILANTEROL 200-25 MCG/ACT IN AEPB: 1.0000 | INHALATION_SPRAY | Freq: Every day | RESPIRATORY_TRACT | Status: DC

## 2023-01-17 MED ORDER — ENOXAPARIN SODIUM 30 MG/0.3ML IJ SOSY
30.0000 mg | PREFILLED_SYRINGE | Freq: Every day | INTRAMUSCULAR | Status: DC
  Administered 2023-01-17 – 2023-01-25 (×9): 30 mg via SUBCUTANEOUS
  Filled 2023-01-17 (×9): qty 0.3

## 2023-01-17 MED ORDER — ONDANSETRON HCL 4 MG/2ML IJ SOLN
4.0000 mg | Freq: Once | INTRAMUSCULAR | Status: AC
  Administered 2023-01-17: 4 mg via INTRAVENOUS
  Filled 2023-01-17: qty 2

## 2023-01-17 MED ORDER — FLUTICASONE FUROATE-VILANTEROL 200-25 MCG/ACT IN AEPB
1.0000 | INHALATION_SPRAY | Freq: Every day | RESPIRATORY_TRACT | Status: DC
  Filled 2023-01-17: qty 28

## 2023-01-17 MED ORDER — DILTIAZEM HCL ER COATED BEADS 120 MG PO CP24
120.0000 mg | ORAL_CAPSULE | Freq: Once | ORAL | Status: AC
  Administered 2023-01-17: 120 mg via ORAL
  Filled 2023-01-17: qty 1

## 2023-01-17 MED ORDER — ATORVASTATIN CALCIUM 40 MG PO TABS
40.0000 mg | ORAL_TABLET | Freq: Every day | ORAL | Status: DC
  Administered 2023-01-17 – 2023-01-25 (×9): 40 mg via ORAL
  Filled 2023-01-17 (×9): qty 1

## 2023-01-17 MED ORDER — PANTOPRAZOLE SODIUM 40 MG PO TBEC
40.0000 mg | DELAYED_RELEASE_TABLET | Freq: Every day | ORAL | Status: DC
  Administered 2023-01-17 – 2023-01-25 (×9): 40 mg via ORAL
  Filled 2023-01-17 (×9): qty 1

## 2023-01-17 MED ORDER — ISOSORBIDE MONONITRATE ER 30 MG PO TB24
15.0000 mg | ORAL_TABLET | Freq: Every day | ORAL | Status: DC
  Administered 2023-01-17 – 2023-01-25 (×9): 15 mg via ORAL
  Filled 2023-01-17 (×9): qty 1

## 2023-01-17 MED ORDER — FLUTICASONE FUROATE-VILANTEROL 200-25 MCG/ACT IN AEPB
1.0000 | INHALATION_SPRAY | Freq: Every day | RESPIRATORY_TRACT | Status: DC
  Administered 2023-01-18 – 2023-01-25 (×8): 1 via RESPIRATORY_TRACT

## 2023-01-17 MED ORDER — SODIUM CHLORIDE 0.9 % IV BOLUS
1000.0000 mL | Freq: Once | INTRAVENOUS | Status: AC
  Administered 2023-01-17: 1000 mL via INTRAVENOUS

## 2023-01-17 MED ORDER — INSULIN ASPART 100 UNIT/ML IJ SOLN
0.0000 [IU] | Freq: Every day | INTRAMUSCULAR | Status: DC
  Administered 2023-01-20 – 2023-01-24 (×3): 2 [IU] via SUBCUTANEOUS
  Filled 2023-01-17: qty 0.05

## 2023-01-17 MED ORDER — SODIUM CHLORIDE 0.9 % IV SOLN: INTRAVENOUS | Status: DC

## 2023-01-17 MED ORDER — METOPROLOL TARTRATE 5 MG/5ML IV SOLN: 5.0000 mg | Freq: Four times a day (QID) | INTRAVENOUS | Status: DC | PRN

## 2023-01-17 MED ORDER — HYDROCODONE-ACETAMINOPHEN 5-325 MG PO TABS
1.0000 | ORAL_TABLET | Freq: Once | ORAL | Status: AC
  Administered 2023-01-17: 1 via ORAL
  Filled 2023-01-17: qty 1

## 2023-01-17 MED ORDER — QUETIAPINE FUMARATE 50 MG PO TABS
50.0000 mg | ORAL_TABLET | Freq: Every day | ORAL | Status: DC
  Administered 2023-01-17 – 2023-01-24 (×8): 50 mg via ORAL
  Filled 2023-01-17 (×2): qty 2
  Filled 2023-01-17 (×2): qty 1
  Filled 2023-01-17: qty 2
  Filled 2023-01-17 (×3): qty 1
  Filled 2023-01-17 (×3): qty 2
  Filled 2023-01-17 (×2): qty 1
  Filled 2023-01-17: qty 2
  Filled 2023-01-17: qty 1
  Filled 2023-01-17: qty 2

## 2023-01-17 MED ORDER — ONDANSETRON HCL 4 MG PO TABS: 4.0000 mg | ORAL_TABLET | Freq: Four times a day (QID) | ORAL | Status: DC | PRN

## 2023-01-17 MED ORDER — FERROUS SULFATE 325 (65 FE) MG PO TABS
325.0000 mg | ORAL_TABLET | Freq: Every day | ORAL | Status: DC
  Administered 2023-01-18 – 2023-01-25 (×8): 325 mg via ORAL
  Filled 2023-01-17 (×8): qty 1

## 2023-01-17 MED ORDER — DOCUSATE SODIUM 100 MG PO CAPS
100.0000 mg | ORAL_CAPSULE | Freq: Two times a day (BID) | ORAL | Status: DC
  Administered 2023-01-17 – 2023-01-25 (×13): 100 mg via ORAL
  Filled 2023-01-17 (×17): qty 1

## 2023-01-17 MED ORDER — ONDANSETRON HCL 4 MG/2ML IJ SOLN
4.0000 mg | Freq: Four times a day (QID) | INTRAMUSCULAR | Status: DC | PRN
  Administered 2023-01-22: 4 mg via INTRAVENOUS
  Filled 2023-01-17: qty 2

## 2023-01-17 MED ORDER — TRAMADOL HCL 50 MG PO TABS
50.0000 mg | ORAL_TABLET | Freq: Three times a day (TID) | ORAL | Status: DC | PRN
  Administered 2023-01-21 – 2023-01-23 (×5): 50 mg via ORAL
  Filled 2023-01-17 (×5): qty 1

## 2023-01-17 MED ORDER — FLUOXETINE HCL 20 MG PO CAPS
20.0000 mg | ORAL_CAPSULE | Freq: Every day | ORAL | Status: DC
  Administered 2023-01-17 – 2023-01-25 (×9): 20 mg via ORAL
  Filled 2023-01-17 (×9): qty 1

## 2023-01-17 MED ORDER — ALBUTEROL SULFATE (2.5 MG/3ML) 0.083% IN NEBU: 2.5000 mg | INHALATION_SOLUTION | RESPIRATORY_TRACT | Status: DC | PRN

## 2023-01-17 MED ORDER — DILTIAZEM HCL ER COATED BEADS 120 MG PO CP24
120.0000 mg | ORAL_CAPSULE | Freq: Every day | ORAL | Status: DC
  Administered 2023-01-17 – 2023-01-19 (×3): 120 mg via ORAL
  Filled 2023-01-17 (×3): qty 1

## 2023-01-17 MED ORDER — INSULIN ASPART 100 UNIT/ML IJ SOLN
0.0000 [IU] | Freq: Three times a day (TID) | INTRAMUSCULAR | Status: DC
  Administered 2023-01-17 – 2023-01-18 (×2): 1 [IU] via SUBCUTANEOUS
  Administered 2023-01-19: 2 [IU] via SUBCUTANEOUS
  Administered 2023-01-20: 3 [IU] via SUBCUTANEOUS
  Administered 2023-01-21: 5 [IU] via SUBCUTANEOUS
  Administered 2023-01-22 – 2023-01-25 (×7): 1 [IU] via SUBCUTANEOUS
  Filled 2023-01-17: qty 0.09

## 2023-01-17 MED ORDER — SODIUM BICARBONATE 650 MG PO TABS
650.0000 mg | ORAL_TABLET | Freq: Three times a day (TID) | ORAL | Status: DC
  Administered 2023-01-17 – 2023-01-25 (×24): 650 mg via ORAL
  Filled 2023-01-17 (×24): qty 1

## 2023-01-17 NOTE — ED Notes (Signed)
MD aware of bp of elevated BP. Home meds ordered.

## 2023-01-17 NOTE — ED Provider Notes (Signed)
EMERGENCY DEPARTMENT AT [facility] Provider Note   CSN: [id number] Arrival date & time: 01/17/23  4696     History  Chief Complaint  Patient presents with   Near Syncope    Michael [name] is a 72 y.o. male.  72 yo M with a chief complaints of a syncopal event.  He said he was resting and suddenly realized that he had lost some time.  He thinks he did feel bad prior to the event and thinks that he passed out or almost passed out.  Has had abdominal pain for some time and does not think it is much worse than it normally is.  He did have an episode of emesis he thinks maybe after he passed out.  He denies any headaches neck pain chest pain shortness of breath.  Has had some chronic low back pain as well which he does not think is changed.  Denies any decreased oral intake.  He tells me he has not been sleeping well because of his social situation.   Near Syncope       Home Medications Prior to Admission medications   Medication Sig Start Date End Date Taking? Authorizing Provider  Accu-Chek Softclix Lancets lancets Use as directed up to four times daily 09/06/22   Zigmund Daniel., MD  albuterol (VENTOLIN HFA) 108 (90 Base) MCG/ACT inhaler Inhale 1-2 puffs into the lungs every 6 (six) hours as needed for wheezing or shortness of breath. 10/24/22   [place], Eric J, DO  atorvastatin (LIPITOR) 40 MG tablet Take 1 tablet (40 mg total) by mouth daily. 12/09/22 04/08/23  Sarina Ill, DO  Blood Glucose Monitoring Suppl (ACCU-CHEK GUIDE) w/Device KIT Use as directed. 09/06/22   Zigmund Daniel., MD  diltiazem (CARDIZEM CD) 120 MG 24 hr capsule Take 1 capsule (120 mg total) by mouth daily. 01/12/23 02/11/23  Terrilee Files, MD  ferrous sulfate 325 (65 FE) MG tablet Take 1 tablet (325 mg total) by mouth daily with breakfast. 12/17/22   Regalado, Belkys A, MD  FLUoxetine (PROZAC) 20 MG capsule Take 1 capsule (20 mg total) by mouth daily. 12/17/22    Regalado, Belkys A, MD  fluticasone furoate-vilanterol (BREO ELLIPTA) 200-25 MCG/ACT AEPB Inhale 1 puff into the lungs daily. 12/10/22   Sarina Ill, DO  folic acid (FOLVITE) 1 MG tablet Take 1 tablet (1 mg total) by mouth daily. 12/17/22   Regalado, Belkys A, MD  glucose blood test strip use as directed up to four times daily 09/06/22   Zigmund Daniel., MD  Insulin Pen Needle 32G X 4 MM MISC Use as directed up to 4 times daily. 09/06/22   Zigmund Daniel., MD  isosorbide mononitrate (IMDUR) 30 MG 24 hr tablet Take 0.5 tablets (15 mg total) by mouth daily. 12/10/22   Sarina Ill, DO  nitroGLYCERIN (NITROSTAT) 0.4 MG SL tablet Place 1 tablet (0.4 mg total) under the tongue every 5 (five) minutes as needed for chest pain. 12/09/22   Sarina Ill, DO  pantoprazole (PROTONIX) 40 MG tablet Take 1 tablet (40 mg total) by mouth daily. 12/17/22   Regalado, Belkys A, MD  polyethylene glycol (MIRALAX / GLYCOLAX) 17 g packet Take 17 g by mouth 2 (two) times daily. 12/17/22   Regalado, Belkys A, MD  QUEtiapine (SEROQUEL) 50 MG tablet Take 1 tablet (50 mg total) by mouth at bedtime. 12/09/22   Sarina Ill, DO  sodium bicarbonate  650 MG tablet Take 1 tablet (650 mg total) by mouth 3 (three) times daily. 12/16/22   Regalado, Prentiss Bells, MD      Allergies    Patient has no known allergies.    Review of Systems   Review of Systems  Cardiovascular:  Positive for near-syncope.    Physical Exam Updated Vital Signs BP (!) 159/95 (BP Location: Right Arm)   Pulse 84   Temp 98.3 F (36.8 C) (Oral)   Resp 14   Ht  (1.727 m)   Wt 69.9 kg   SpO2 100%   BMI 23.42 kg/m  Physical Exam Vitals and nursing note reviewed.  Constitutional:      Appearance: He is well-developed.  HENT:     Head: Normocephalic and atraumatic.  Eyes:     Pupils: Pupils are equal, round, and reactive to light.  Neck:     Vascular: No JVD.  Cardiovascular:     Rate and Rhythm:  Normal rate and regular rhythm.     Heart sounds: No murmur heard.    No friction rub. No gallop.  Pulmonary:     Effort: No respiratory distress.     Breath sounds: No wheezing.  Abdominal:     General: There is no distension.     Tenderness: There is no abdominal tenderness. There is no guarding or rebound.     Comments: Mild diffuse abdominal discomfort  Musculoskeletal:        General: Normal range of motion.     Cervical back: Normal range of motion and neck supple.  Skin:    Coloration: Skin is not pale.     Findings: No rash.  Neurological:     Mental Status: He is alert and oriented to person, place, and time.  Psychiatric:        Behavior: Behavior normal.     ED Results / Procedures / Treatments   Labs (all labs ordered are listed, but only abnormal results are displayed) Labs Reviewed  CBC WITH DIFFERENTIAL/PLATELET - Abnormal; Notable for the following components:      Result Value   RBC 3.21 (*)    Hemoglobin 9.0 (*)    HCT 28.3 (*)    Platelets 123 (*)    Lymphs Abs 0.2 (*)    All other components within normal limits  COMPREHENSIVE METABOLIC PANEL - Abnormal; Notable for the following components:   Chloride 112 (*)    CO2 20 (*)    Glucose, Bld 153 (*)    BUN 70 (*)    Creatinine, Ser 3.58 (*)    Calcium 8.5 (*)    GFR, Estimated 17 (*)    All other components within normal limits  LIPASE, BLOOD - Abnormal; Notable for the following components:   Lipase 261 (*)    All other components within normal limits    EKG EKG Interpretation  Date/Time:  Friday January 17 2023 08:43:46 EDT Ventricular Rate:  91 PR Interval:  149 QRS Duration: 71 QT Interval:  366 QTC Calculation: 451 R Axis:   64 Text Interpretation: Sinus rhythm Atrial premature complexes Left ventricular hypertrophy Nonspecific T abnormalities, lateral leads No significant change since last tracing Confirmed by Melene Plan [phone]) on 01/17/2023 8:57:56 AM  Radiology CT ABDOMEN PELVIS WO  CONTRAST  Result Date: 01/17/2023 CLINICAL DATA:  Abdominal pain EXAM: CT ABDOMEN AND PELVIS WITHOUT CONTRAST TECHNIQUE: Multidetector CT imaging of the abdomen and pelvis was performed following the standard protocol without IV contrast. RADIATION  DOSE REDUCTION: This exam was performed according to the departmental dose-optimization program which includes automated exposure control, adjustment of the mA and/or kV according to patient size and/or use of iterative reconstruction technique. COMPARISON:  01/09/2023 FINDINGS: Lower chest: Dependent subsegmental atelectasis or scarring medial right base. No pericardial or pleural effusion. Hepatobiliary: No focal liver abnormality is seen. No gallstones, gallbladder wall thickening, or biliary dilatation. Pancreas: Unremarkable. No pancreatic ductal dilatation or surrounding inflammatory changes. Spleen: Normal in size without focal abnormality. Adrenals/Urinary Tract: Adrenal glands are unremarkable. Kidneys are normal, without renal calculi, focal lesion, or hydronephrosis. Bladder is unremarkable. Stomach/Bowel: Stomach is within normal limits. Appendix appears normal. No evidence of bowel wall thickening, distention, or inflammatory changes. Vascular/Lymphatic: Aortic atherosclerosis. No enlarged abdominal or pelvic lymph nodes. Reproductive: Findings in the prostate suggest prior TURP. Other: No abdominal wall hernia or abnormality. No abdominopelvic ascites. Musculoskeletal: Lumbosacral degenerative changes with vacuum disc changes and marginal osteophytes. Grade 1 L5 retrolisthesis. IMPRESSION: No acute abdominal or pelvic pathology identified. Electronically Signed   By: Layla Maw M.D.   On: 01/17/2023 10:16   DG Chest 2 View  Result Date: 01/16/2023 CLINICAL DATA:  Left side chest pain EXAM: CHEST - 2 VIEW COMPARISON:  01/12/2023 FINDINGS: Prior median sternotomy. Heart and mediastinal contours are within normal limits. No focal opacities or  effusions. No acute bony abnormality. IMPRESSION: No active cardiopulmonary disease. Electronically Signed   By: Charlett Nose M.D.   On: 01/16/2023 23:56    Procedures Procedures    Medications Ordered in ED Medications  sodium chloride 0.9 % bolus 1,000 mL (1,000 mLs Intravenous New Bag/Given 01/17/23 0835)  ondansetron (ZOFRAN) injection 4 mg (4 mg Intravenous Given 01/17/23 8469)    ED Course/ Medical Decision Making/ A&P                             Medical Decision Making Amount and/or Complexity of Data Reviewed Labs: ordered. Radiology: ordered.  Risk Prescription drug management.   72 yo M with chief complaints of syncopal event.  Patient said he was resting and he suddenly felt bad and then thinks maybe he lost consciousness for maybe almost.  He actually falls asleep a couple times during our discussion.  He tells me that he is very sleepy to get a sleep the past couple nights due to his social situation.  Perhaps the patient just fell asleep.  He is complaining of some abdominal pain.  He has a history of polysubstance abuse and has been seen in the ED for epigastric and chest pain multiple times in the past.  Will obtain a CT of the abdomen pelvis.  Blood work.  Reassess.  Patient's lipase is significant elevated.  No leukocytosis, patient has CKD and no significant changes renal function.  He continues to not feel well.  Still continues to be quite sleepy.  Not eating or drinking for me here.  CT scan of the abdomen pelvis without obvious acute intra-abdominal pathology.  Will discuss with medicine for admission.  The patients results and plan were reviewed and discussed.   Any x-rays performed were independently reviewed by myself.   Differential diagnosis were considered with the presenting HPI.  Medications  sodium chloride 0.9 % bolus 1,000 mL (1,000 mLs Intravenous New Bag/Given 01/17/23 0835)  ondansetron (ZOFRAN) injection 4 mg (4 mg Intravenous Given 01/17/23 0843)     Vitals:   01/17/23 0722 01/17/23 0727 01/17/23 1037  BP:  Marland Kitchen)  165/96 (!) 159/95  Pulse:  89 84  Resp:  17 14  Temp:  98.2 F (36.8 C) 98.3 F (36.8 C)  TempSrc:  Oral Oral  SpO2:  98% 100%  Weight: 69.9 kg    Height: 5\' 8"  (1.727 m)      Final diagnoses:  Acute pancreatitis, unspecified complication status, unspecified pancreatitis type    Admission/ observation were discussed with the admitting physician, patient and/or family and they are comfortable with the plan.          Final Clinical Impression(s) / ED Diagnoses Final diagnoses:  Acute pancreatitis, unspecified complication status, unspecified pancreatitis type    Rx / DC Orders ED Discharge Orders     None         Melene Plan, DO 01/17/23 1056

## 2023-01-17 NOTE — Discharge Instructions (Signed)
Do not use cocaine!  Anytime you use cocaine, you are likely to have similar pain to what you had today.  Also, cocaine can cause you to have a heart attack and you can die from that.

## 2023-01-17 NOTE — ED Notes (Signed)
ED TO INPATIENT HANDOFF REPORT  ED Nurse Name and Phone #: Dorene Sorrow Name/Age/Gender Michael [name] 72 y.o. male Room/Bed: WA19/WA19  Code Status   Code Status: Full Code  Home/SNF/Other Home Patient oriented to: self, place, time, and situation Is this baseline? Yes   Triage Complete: Triage complete  Chief Complaint Acute pancreatitis [K85.90]  Triage Note Pt states he is having "blacking out" states it happened about 10 times yesterday. Was at [facility] earlier, pt states they did not address this issue. Poor historian.pt continues to add he has back pain and emesis that he did not address earlier this morning. He continues to add to the list as he sits in triage.Now has body aches and states he is suicidal without a plan.   Allergies No Known Allergies  Level of Care/Admitting Diagnosis ED Disposition     ED Disposition  Admit   Condition  --   Comment  Hospital Area: [facility] [[id number]]  Level of Care: Telemetry [5]  Admit to tele based on following criteria: Eval of Syncope  May place patient in observation at [facility] or [facility] if equivalent level of care is available:: Yes  Covid Evaluation: Asymptomatic - no recent exposure (last 10 days) testing not required  Diagnosis: Acute pancreatitis [577.0.ICD-9-CM]  Admitting Physician: Maryln Gottron [[id number]]  Attending Physician: Kirby Crigler, MIR [email or web address] [[id number]]          B Medical/Surgery History Past Medical History:  Diagnosis Date   A-fib    Anemia    Asthma    No PFTs, history of childhood asthma   CAD (coronary artery disease)    Cellulitis 04/2014   left facial   Cellulitis and abscess of toe of right foot 12/08/2019   Chondromalacia of medial femoral condyle    Left knee MRI 04/28/12: Chondromalacia of the medial femoral condyle with slight peripheral degeneration of the meniscocapsular junction of the medial meniscus; followed by sports medicine   Chronic kidney failure,  stage 4 (severe)    Collagen vascular disease    Crack cocaine use    for 20+ years, has been enrolled in detox programs in the past   Depression    with history of hospitalization for suicidal ideation   Diabetes mellitus 2002   Diagnosed in 2002, started insulin in 2012   Gout    Headache(784.0)    CT head 08/2011: Periventricular and subcortical white matter hypodensities are most in keeping with chronic microangiopathic change   HIV infection 08/2011   Followed by Dr. Ninetta Lights   Hyperlipidemia    Hypertension    Pulmonary embolism    Past Surgical History:  Procedure Laterality Date   AMPUTATION Right 07/21/2019   Procedure: RIGHT SECOND TOE AMPUTATION;  Surgeon: Nadara Mustard, MD;  Location: [facility] OR;  Service: Orthopedics;  Laterality: Right;   BACK SURGERY     1988   BOWEL RESECTION     CARDIAC SURGERY     CERVICAL SPINE SURGERY     " rods in my neck "   CORONARY ARTERY BYPASS GRAFT     CORONARY STENT PLACEMENT     NM MYOCAR PERF WALL MOTION  12/27/2011   normal   SPINE SURGERY       A IV Location/Drains/Wounds Patient Lines/Drains/Airways Status     Active Line/Drains/Airways     Name Placement date Placement time Site Days   Peripheral IV 01/17/23 20 G 1.88" Right;Upper;Lateral Arm 01/17/23  0830  Arm  less than 1            Intake/Output Last 24 hours No intake or output data in the 24 hours ending 01/17/23 1126  Labs/Imaging Results for orders placed or performed during the hospital encounter of 01/17/23 (from the past 48 hour(s))  CBC with Differential     Status: Abnormal   Collection Time: 01/17/23  8:36 AM  Result Value Ref Range   WBC 5.9 4.0 - 10.5 K/uL   RBC 3.21 (L) 4.22 - 5.81 MIL/uL   Hemoglobin 9.0 (L) 13.0 - 17.0 g/dL   HCT 00.3 (L) 49.1 - 79.1 %   MCV 88.2 80.0 - 100.0 fL   MCH 28.0 26.0 - 34.0 pg   MCHC 31.8 30.0 - 36.0 g/dL   RDW 50.5 69.7 - 94.8 %   Platelets 123 (L) 150 - 400 K/uL   nRBC 0.0 0.0 - 0.2 %   Neutrophils Relative  % 89 %   Neutro Abs 5.3 1.7 - 7.7 K/uL   Lymphocytes Relative 4 %   Lymphs Abs 0.2 (L) 0.7 - 4.0 K/uL   Monocytes Relative 5 %   Monocytes Absolute 0.3 0.1 - 1.0 K/uL   Eosinophils Relative 1 %   Eosinophils Absolute 0.1 0.0 - 0.5 K/uL   Basophils Relative 0 %   Basophils Absolute 0.0 0.0 - 0.1 K/uL   Immature Granulocytes 1 %   Abs Immature Granulocytes 0.04 0.00 - 0.07 K/uL    Comment: Performed at [facility], [address], [place], [place] [address]  Comprehensive metabolic panel     Status: Abnormal   Collection Time: 01/17/23  8:36 AM  Result Value Ref Range   Sodium 140 135 - 145 mmol/L   Potassium 4.4 3.5 - 5.1 mmol/L   Chloride 112 (H) 98 - 111 mmol/L   CO2 20 (L) 22 - 32 mmol/L   Glucose, Bld 153 (H) 70 - 99 mg/dL    Comment: Glucose reference range applies only to samples taken after fasting for at least 8 hours.   BUN 70 (H) 8 - 23 mg/dL   Creatinine, Ser 3.74 (H) 0.61 - 1.24 mg/dL   Calcium 8.5 (L) 8.9 - 10.3 mg/dL   Total Protein 7.3 6.5 - 8.1 g/dL   Albumin 3.9 3.5 - 5.0 g/dL   AST 16 15 - 41 U/L   ALT 17 0 - 44 U/L   Alkaline Phosphatase 63 38 - 126 U/L   Total Bilirubin 0.4 0.3 - 1.2 mg/dL   GFR, Estimated 17 (L) >60 mL/min    Comment: (NOTE) Calculated using the CKD-EPI Creatinine Equation (2021)    Anion gap 8 5 - 15    Comment: Performed at [facility], [address], [place], [place] [address]  Lipase, blood     Status: Abnormal   Collection Time: 01/17/23  8:36 AM  Result Value Ref Range   Lipase 261 (H) 11 - 51 U/L    Comment: Performed at [facility], [address], [place], [place] [address]   CT ABDOMEN PELVIS WO CONTRAST  Result Date: 01/17/2023 CLINICAL DATA:  Abdominal pain EXAM: CT ABDOMEN AND PELVIS WITHOUT CONTRAST TECHNIQUE: Multidetector CT imaging of the abdomen and pelvis was performed following the standard protocol without IV contrast. RADIATION DOSE REDUCTION: This exam  was performed according to the departmental dose-optimization program which includes automated exposure control, adjustment of the mA and/or kV according to patient size and/or use of iterative  reconstruction technique. COMPARISON:  01/09/2023 FINDINGS: Lower chest: Dependent subsegmental atelectasis or scarring medial right base. No pericardial or pleural effusion. Hepatobiliary: No focal liver abnormality is seen. No gallstones, gallbladder wall thickening, or biliary dilatation. Pancreas: Unremarkable. No pancreatic ductal dilatation or surrounding inflammatory changes. Spleen: Normal in size without focal abnormality. Adrenals/Urinary Tract: Adrenal glands are unremarkable. Kidneys are normal, without renal calculi, focal lesion, or hydronephrosis. Bladder is unremarkable. Stomach/Bowel: Stomach is within normal limits. Appendix appears normal. No evidence of bowel wall thickening, distention, or inflammatory changes. Vascular/Lymphatic: Aortic atherosclerosis. No enlarged abdominal or pelvic lymph nodes. Reproductive: Findings in the prostate suggest prior TURP. Other: No abdominal wall hernia or abnormality. No abdominopelvic ascites. Musculoskeletal: Lumbosacral degenerative changes with vacuum disc changes and marginal osteophytes. Grade 1 L5 retrolisthesis. IMPRESSION: No acute abdominal or pelvic pathology identified. Electronically Signed   By: Layla Maw M.D.   On: 01/17/2023 10:16   DG Chest 2 View  Result Date: 01/16/2023 CLINICAL DATA:  Left side chest pain EXAM: CHEST - 2 VIEW COMPARISON:  01/12/2023 FINDINGS: Prior median sternotomy. Heart and mediastinal contours are within normal limits. No focal opacities or effusions. No acute bony abnormality. IMPRESSION: No active cardiopulmonary disease. Electronically Signed   By: Charlett Nose M.D.   On: 01/16/2023 23:56    Pending Labs Unresulted Labs (From admission, onward)     Start     Ordered   01/24/23 0500  Creatinine, serum   (enoxaparin (LOVENOX)    CrCl >/= 30 ml/min)  Weekly,   R     Comments: while on enoxaparin therapy    01/17/23 1107   01/18/23 0500  Lipase, blood  Tomorrow morning,   R        01/17/23 1103   01/18/23 0500  Comprehensive metabolic panel  Tomorrow morning,   R        01/17/23 1107   01/18/23 0500  CBC  Tomorrow morning,   R        01/17/23 1107   01/17/23 1123  Triglycerides  Once,   R        01/17/23 1122   01/17/23 1106  Ethanol  Once,   R        01/17/23 1107   01/17/23 1106  CBC  (enoxaparin (LOVENOX)    CrCl >/= 30 ml/min)  Once,   R       Comments: Baseline for enoxaparin therapy IF NOT ALREADY DRAWN.  Notify MD if PLT < 100 K.    01/17/23 1107   01/17/23 1106  Creatinine, serum  (enoxaparin (LOVENOX)    CrCl >/= 30 ml/min)  Once,   R       Comments: Baseline for enoxaparin therapy IF NOT ALREADY DRAWN.    01/17/23 1107   01/17/23 1102  Rapid urine drug screen (hospital performed)  ONCE - STAT,   STAT        01/17/23 1101   01/17/23 1102  Ammonia  Once,   STAT        01/17/23 1101   01/17/23 1102  TSH  Once,   URGENT        01/17/23 1101            Vitals/Pain Today's Vitals   01/17/23 1037 01/17/23 1045 01/17/23 1100 01/17/23 1115  BP: (!) 159/95 (!) 160/92 (!) 156/90 (!) 167/95  Pulse: 84  83 86  Resp: Temp: 98.3 F (36.8 C)     TempSrc: Oral  SpO2: 100%  100% 100%  Weight:      Height:      PainSc:        Isolation Precautions No active isolations  Medications Medications  isosorbide mononitrate (IMDUR) 24 hr tablet 15 mg (has no administration in time range)  diltiazem (CARDIZEM CD) 24 hr capsule 120 mg (has no administration in time range)  atorvastatin (LIPITOR) tablet 40 mg (has no administration in time range)  FLUoxetine (PROZAC) capsule 20 mg (has no administration in time range)  QUEtiapine (SEROQUEL) tablet 50 mg (has no administration in time range)  pantoprazole (PROTONIX) EC tablet 40 mg (has no administration in time  range)  sodium bicarbonate tablet 650 mg (has no administration in time range)  ferrous sulfate tablet 325 mg (has no administration in time range)  fluticasone furoate-vilanterol (BREO ELLIPTA) 200-25 MCG/ACT 1 puff (has no administration in time range)  enoxaparin (LOVENOX) injection 30 mg (has no administration in time range)  0.9 %  sodium chloride infusion (has no administration in time range)  traMADol (ULTRAM) tablet 50 mg (has no administration in time range)  docusate sodium (COLACE) capsule 100 mg (has no administration in time range)  ondansetron (ZOFRAN) tablet 4 mg (has no administration in time range)    Or  ondansetron (ZOFRAN) injection 4 mg (has no administration in time range)  albuterol (PROVENTIL) (2.5 MG/3ML) 0.083% nebulizer solution 2.5 mg (has no administration in time range)  metoprolol tartrate (LOPRESSOR) injection 5 mg (has no administration in time range)  insulin aspart (novoLOG) injection 0-9 Units (has no administration in time range)  insulin aspart (novoLOG) injection 0-5 Units (has no administration in time range)  sodium chloride 0.9 % bolus 1,000 mL (1,000 mLs Intravenous New Bag/Given 01/17/23 0835)  ondansetron (ZOFRAN) injection 4 mg (4 mg Intravenous Given 01/17/23 0843)    Mobility walks     Focused Assessments    R Recommendations: See Admitting Provider Note  Report given to:   Additional Notes:

## 2023-01-17 NOTE — H&P (Addendum)
History and Physical  Michael [name] ZOX:[id number] DOB: [birth year] DOA: 01/17/2023  PCP: Patient, No Pcp Per   Chief Complaint: syncope   HPI: Michael [name] is a 72 y.o. male with a history including but not limited to homelessness, cocaine abuse, paroxysmal atrial fibrillation on Cardizem, CKD stage IV, CAD status post CABG, insulin-dependent type 2 diabetes being admitted to the hospital with multiple complaints found to have possible acute pancreatitis.  Patient has had multiple recent ER visits with complaints of chest pain, including twice already this month.  He was discharged from the ER after evaluation completed likely musculoskeletal chest pain, also induced by cocaine use.  He was seen early this morning at the [name], ER with the same complaints, and seen diagnosis.  Several hours later, he came to this emergency department complaining syncope, nausea, abdominal pain, chest pain.  ED Course: Evaluation in the ER revealed hypertension, but otherwise normal vital signs.  Lab work this morning is stable, with stable anemia, renal function only slightly worse than his baseline.  Troponin 24, 21 and flat.  Glucose 153.  Lipase elevated 261 which is higher than it has been in the last year.  Currently: Patient appears to be resting comfortably in the ER, denies any pain.  Denies any illicit drug use.  Review of Systems: Please see HPI for pertinent positives and negatives. A complete 10 system review of systems could not be completed due to the patient's somnolence and uncooperation.  Past Medical History:  Diagnosis Date   A-fib    Anemia    Asthma    No PFTs, history of childhood asthma   CAD (coronary artery disease)    Cellulitis 04/2014   left facial   Cellulitis and abscess of toe of right foot 12/08/2019   Chondromalacia of medial femoral condyle    Left knee MRI 04/28/12: Chondromalacia of the medial femoral condyle with slight peripheral degeneration of the  meniscocapsular junction of the medial meniscus; followed by sports medicine   Chronic kidney failure, stage 4 (severe)    Collagen vascular disease    Crack cocaine use    for 20+ years, has been enrolled in detox programs in the past   Depression    with history of hospitalization for suicidal ideation   Diabetes mellitus 2002   Diagnosed in 2002, started insulin in 2012   Gout    Headache(784.0)    CT head 08/2011: Periventricular and subcortical white matter hypodensities are most in keeping with chronic microangiopathic change   HIV infection 08/2011   Followed by Dr. Ninetta Lights   Hyperlipidemia    Hypertension    Pulmonary embolism    Past Surgical History:  Procedure Laterality Date   AMPUTATION Right 07/21/2019   Procedure: RIGHT SECOND TOE AMPUTATION;  Surgeon: Nadara Mustard, MD;  Location: [facility] OR;  Service: Orthopedics;  Laterality: Right;   BACK SURGERY     1988   BOWEL RESECTION     CARDIAC SURGERY     CERVICAL SPINE SURGERY     " rods in my neck "   CORONARY ARTERY BYPASS GRAFT     CORONARY STENT PLACEMENT     NM MYOCAR PERF WALL MOTION  12/27/2011   normal   SPINE SURGERY      Social History:  reports that he has never smoked. He has never used smokeless tobacco. He reports that he does not currently use drugs after having used the following drugs: "Crack" cocaine and Cocaine.  Frequency: 4.00 times per week. He reports that he does not drink alcohol.   No Known Allergies  Family History  Problem Relation Age of Onset   Diabetes Mother    Hypertension Mother    Hyperlipidemia Mother    Diabetes Father    Cancer Father    Hypertension Father    Diabetes Brother    Heart disease Brother    Diabetes Sister    Colon cancer Neg Hx      Prior to Admission medications   Medication Sig Start Date End Date Taking? Authorizing Provider  Accu-Chek Softclix Lancets lancets Use as directed up to four times daily 09/06/22   Zigmund Daniel., MD  albuterol  (VENTOLIN HFA) 108 (90 Base) MCG/ACT inhaler Inhale 1-2 puffs into the lungs every 6 (six) hours as needed for wheezing or shortness of breath. 10/24/22   [place], Eric J, DO  atorvastatin (LIPITOR) 40 MG tablet Take 1 tablet (40 mg total) by mouth daily. 12/09/22 04/08/23  Sarina Ill, DO  Blood Glucose Monitoring Suppl (ACCU-CHEK GUIDE) w/Device KIT Use as directed. 09/06/22   Zigmund Daniel., MD  diltiazem (CARDIZEM CD) 120 MG 24 hr capsule Take 1 capsule (120 mg total) by mouth daily. 01/12/23 02/11/23  Terrilee Files, MD  ferrous sulfate 325 (65 FE) MG tablet Take 1 tablet (325 mg total) by mouth daily with breakfast. 12/17/22   Regalado, Belkys A, MD  FLUoxetine (PROZAC) 20 MG capsule Take 1 capsule (20 mg total) by mouth daily. 12/17/22   Regalado, Belkys A, MD  fluticasone furoate-vilanterol (BREO ELLIPTA) 200-25 MCG/ACT AEPB Inhale 1 puff into the lungs daily. 12/10/22   Sarina Ill, DO  folic acid (FOLVITE) 1 MG tablet Take 1 tablet (1 mg total) by mouth daily. 12/17/22   Regalado, Belkys A, MD  glucose blood test strip use as directed up to four times daily 09/06/22   Zigmund Daniel., MD  Insulin Pen Needle 32G X 4 MM MISC Use as directed up to 4 times daily. 09/06/22   Zigmund Daniel., MD  isosorbide mononitrate (IMDUR) 30 MG 24 hr tablet Take 0.5 tablets (15 mg total) by mouth daily. 12/10/22   Sarina Ill, DO  nitroGLYCERIN (NITROSTAT) 0.4 MG SL tablet Place 1 tablet (0.4 mg total) under the tongue every 5 (five) minutes as needed for chest pain. 12/09/22   Sarina Ill, DO  pantoprazole (PROTONIX) 40 MG tablet Take 1 tablet (40 mg total) by mouth daily. 12/17/22   Regalado, Belkys A, MD  polyethylene glycol (MIRALAX / GLYCOLAX) 17 g packet Take 17 g by mouth 2 (two) times daily. 12/17/22   Regalado, Belkys A, MD  QUEtiapine (SEROQUEL) 50 MG tablet Take 1 tablet (50 mg total) by mouth at bedtime. 12/09/22   Sarina Ill, DO  sodium  bicarbonate 650 MG tablet Take 1 tablet (650 mg total) by mouth 3 (three) times daily. 12/16/22   Regalado, Prentiss Bells, MD    Physical Exam: BP (!) 159/95 (BP Location: Right Arm)   Pulse 84   Temp 98.3 F (36.8 C) (Oral)   Resp 14   Ht 5\' 8"  (1.727 m)   Wt 69.9 kg   SpO2 100%   BMI 23.42 kg/m   General: Well-nourished well-developed male resting on the stretcher in the ER.  He is somewhat somnolent, but arousable.  He answers questions appropriately, he is oriented x 4, however seems unwilling/unable to answer a lot  of questions and falling back asleep. Eyes: EOMI, clear conjuctivae, white sclerea pupils equally round and react.To light and accommodation Neck: supple, no masses, trachea mildline  Cardiovascular: RRR, no murmurs or rubs, no peripheral edema  Respiratory: clear to auscultation bilaterally, no wheezes, no crackles  Abdomen: soft, diffusely tender, with voluntary guarding, nondistended, normal bowel tones heard  Skin: dry, no rashes  Musculoskeletal: no joint effusions, normal range of motion  Psychiatric: appropriate affect, normal speech  Neurologic: extraocular muscles intact, clear speech, moving all extremities with intact sensorium.  Somewhat somnolent as mentioned above, but arousable.         Labs on Admission:  Basic Metabolic Panel: Recent Labs  Lab 01/12/23 1145 01/16/23 2325 01/17/23 0836  NA 141 141 140  K 4.4 4.6 4.4  CL 115* 114* 112*  CO2 15* 18* 20*  GLUCOSE 86 143* 153*  BUN 38* 59* 70*  CREATININE 3.55* 3.40* 3.58*  CALCIUM 8.7* 8.4* 8.5*   Liver Function Tests: Recent Labs  Lab 01/17/23 0836  AST 16  ALT 17  ALKPHOS 63  BILITOT 0.4  PROT 7.3  ALBUMIN 3.9   Recent Labs  Lab 01/17/23 0836  LIPASE 261*   No results for input(s): "AMMONIA" in the last 168 hours. CBC: Recent Labs  Lab 01/12/23 1145 01/16/23 2325 01/17/23 0836  WBC 3.5* 3.9* 5.9  NEUTROABS  --   --  5.3  HGB 9.8* 9.1* 9.0*  HCT 29.1* 27.6* 28.3*  MCV 86.1  87.1 88.2  PLT 119* 149* 123*   Cardiac Enzymes: No results for input(s): "CKTOTAL", "CKMB", "CKMBINDEX", "TROPONINI" in the last 168 hours.  BNP (last 3 results) Recent Labs    07/02/22 0833  BNP 169.9*    ProBNP (last 3 results) No results for input(s): "PROBNP" in the last 8760 hours.  CBG: No results for input(s): "GLUCAP" in the last 168 hours.  Radiological Exams on Admission: CT ABDOMEN PELVIS WO CONTRAST  Result Date: 01/17/2023 CLINICAL DATA:  Abdominal pain EXAM: CT ABDOMEN AND PELVIS WITHOUT CONTRAST TECHNIQUE: Multidetector CT imaging of the abdomen and pelvis was performed following the standard protocol without IV contrast. RADIATION DOSE REDUCTION: This exam was performed according to the departmental dose-optimization program which includes automated exposure control, adjustment of the mA and/or kV according to patient size and/or use of iterative reconstruction technique. COMPARISON:  01/09/2023 FINDINGS: Lower chest: Dependent subsegmental atelectasis or scarring medial right base. No pericardial or pleural effusion. Hepatobiliary: No focal liver abnormality is seen. No gallstones, gallbladder wall thickening, or biliary dilatation. Pancreas: Unremarkable. No pancreatic ductal dilatation or surrounding inflammatory changes. Spleen: Normal in size without focal abnormality. Adrenals/Urinary Tract: Adrenal glands are unremarkable. Kidneys are normal, without renal calculi, focal lesion, or hydronephrosis. Bladder is unremarkable. Stomach/Bowel: Stomach is within normal limits. Appendix appears normal. No evidence of bowel wall thickening, distention, or inflammatory changes. Vascular/Lymphatic: Aortic atherosclerosis. No enlarged abdominal or pelvic lymph nodes. Reproductive: Findings in the prostate suggest prior TURP. Other: No abdominal wall hernia or abnormality. No abdominopelvic ascites. Musculoskeletal: Lumbosacral degenerative changes with vacuum disc changes and  marginal osteophytes. Grade 1 L5 retrolisthesis. IMPRESSION: No acute abdominal or pelvic pathology identified. Electronically Signed   By: Layla Maw M.D.   On: 01/17/2023 10:16   DG Chest 2 View  Result Date: 01/16/2023 CLINICAL DATA:  Left side chest pain EXAM: CHEST - 2 VIEW COMPARISON:  01/12/2023 FINDINGS: Prior median sternotomy. Heart and mediastinal contours are within normal limits. No focal opacities or effusions. No  acute bony abnormality. IMPRESSION: No active cardiopulmonary disease. Electronically Signed   By: Charlett Nose M.D.   On: 01/16/2023 23:56    Assessment/Plan This is a chronically ill 72 year old gentleman with a history of homelessness, cocaine abuse resulting in chest pain, CAD status post CABG, depression with anxiety, CKD stage IV who presented once again to the ER with a plethora of complaints and was found to have acute pancreatitis of unclear etiology.  LFTs are unremarkable, as is bilirubin.   Acute pancreatitis --etiology is unclear, imaging, albeit without contrast, without acute intra-abdominal pathology. -Observation admission -Clear liquid diet, as the patient is currently not nauseous and has no pain -IV fluids, limited to 24 hours due to patient's history of heart failure and CKD -Tramadol as needed pain -Zofran as needed nausea, patient has not vomited here -Check triglycerides  Active Problems:   Essential hypertension-continue Cardizem   (HFpEF) heart failure with preserved ejection fraction-continue Imdur, no evidence of acute exacerbation   DMII (diabetes mellitus, type 2)-last hemoglobin A1c 6.8 on 12/25.  As his diet is advanced, would recommend diabetic diet, added sensitive sliding scale insulin.   Polysubstance abuse, ongoing cocaine abuse-this is only adding to his problems   Hypertension goal BP (blood pressure) < 140/80-continue Cardizem   GERD (gastroesophageal reflux disease)-continue oral PPI   Chronic kidney disease, stage IV-not  significantly changed from his most recent baseline, will avoid nephrotoxins as able, he is being hydrated as above and will follow renal function closely   Homelessness-I suspect his social issues are leading to his plethora of ER visits   Anemia of chronic disease   Suicidal Ideation - patient is stating to staff (but did not mention to me) suicidal ideation but has no plan. Will consult Psych when medically stable. Sitter with suicide precautions in the mean time.  DVT prophylaxis: Lovenox     Code Status: Full Code  Consults called: None  Admission status: Observation  Time spent: 48 minutes  Maitlyn Penza Sharlette Dense MD [facility] Pager [phone]  If 7PM-7AM, please contact night-coverage [email or web address] Password [facility]  01/17/2023, 11:09 AM

## 2023-01-17 NOTE — ED Triage Notes (Addendum)
Pt states he is having "blacking out" states it happened about 10 times yesterday. Was at [facility] earlier, pt states they did not address this issue. Poor historian.pt continues to add he has back pain and emesis that he did not address earlier this morning. He continues to add to the list as he sits in triage.Now has body aches and states he is suicidal without a plan.

## 2023-01-17 NOTE — TOC Initial Note (Signed)
Transition of Care [facility]) - Initial/Assessment Note    Patient Details  Name: Michael [name] MRN: [id number] Date of Birth: 1951/07/19  Transition of Care [facility]) CM/SW Contact:    Otelia Santee, LCSW Phone Number: 01/17/2023, 4:04 PM  Clinical Narrative:                 Met with pt at bedside. Pt agreeable to having SA, housing and other resources added to discharge instructions. Resources added to AVS.   Expected Discharge Plan: Homeless Shelter Barriers to Discharge: No Barriers Identified   Patient Goals and CMS Choice Patient states their goals for this hospitalization and ongoing recovery are:: To sleep CMS Medicare.gov Compare Post Acute Care list provided to:: Patient Choice offered to / list presented to : Patient [place] ownership interest in [facility].provided to:: Patient    Expected Discharge Plan and Services In-house Referral: Clinical Social Work Discharge Planning Services: NA Post Acute Care Choice: NA Living arrangements for the past 2 months: Homeless Shelter                 DME Arranged: N/A DME Agency: NA                  Prior Living Arrangements/Services Living arrangements for the past 2 months: Homeless Shelter Lives with:: Self Patient language and need for interpreter reviewed:: Yes Do you feel safe going back to the place where you live?: Yes      Need for Family Participation in Patient Care: No (Comment) Care giver support system in place?: No (comment)   Criminal Activity/Legal Involvement Pertinent to Current Situation/Hospitalization: No - Comment as needed  Activities of Daily Living Home Assistive Devices/Equipment: Cane (specify quad or straight), Walker (specify type) ADL Screening (condition at time of admission) Patient's cognitive ability adequate to safely complete daily activities?: Yes Is the patient deaf or have difficulty hearing?: No Does the patient have difficulty seeing, even when wearing  glasses/contacts?: No Does the patient have difficulty concentrating, remembering, or making decisions?: No Patient able to express need for assistance with ADLs?: Yes Does the patient have difficulty dressing or bathing?: Yes Independently performs ADLs?: Yes (appropriate for developmental age) Does the patient have difficulty walking or climbing stairs?: Yes Weakness of Legs: Both Weakness of Arms/Hands: Both  Permission Sought/Granted   Permission granted to share information with : No              Emotional Assessment Appearance:: Appears stated age Attitude/Demeanor/Rapport: Lethargic Affect (typically observed): Accepting Orientation: : Oriented to Self, Oriented to Place, Oriented to  Time, Oriented to Situation Alcohol / Substance Use: Illicit Drugs, Alcohol Use, Tobacco Use Psych Involvement: No (comment)  Admission diagnosis:  Acute pancreatitis [K85.90] Acute pancreatitis, unspecified complication status, unspecified pancreatitis type [K85.90] Patient Active Problem List   Diagnosis Date Noted   Acute pancreatitis 01/17/2023   AKI (acute kidney injury) 12/12/2022   Hyperkalemia 10/05/2022   CKD (chronic kidney disease), stage IV 09/23/2022   Polysubstance abuse 09/13/2022   ARF (acute renal failure) 09/09/2022   (HFpEF) heart failure with preserved ejection fraction 09/09/2022   Hypertensive urgency 12/09/2021   Impaired functional mobility, balance, and endurance 11/29/2021   Dilated cardiomyopathy 11/29/2021   Arthritis 11/29/2021   Diabetic foot infection 06/20/2021   Presence of aortocoronary bypass graft 04/16/2021   Iron deficiency anemia 04/04/2021   Atypical chest pain 04/03/2021   Anemia of chronic disease 12/14/2020   DMII (diabetes mellitus, type 2) 08/14/2020  Idiopathic chronic gout of multiple sites without tophus 02/17/2020   Diabetic ulcer of toe of right foot associated with type 2 diabetes mellitus 12/17/2019   Vitamin B12 deficiency  12/17/2019   Homelessness 12/13/2019   Cellulitis 12/07/2019   Osteomyelitis 07/17/2019   Abscess or cellulitis of toe, right    Toe pain, right-second    MDD (major depressive disorder), recurrent episode, severe 06/15/2019   Respiratory distress 04/12/2019   Pneumonia due to severe acute respiratory syndrome coronavirus 2 (SARS-CoV-2) 04/11/2019   COVID-19 virus infection 03/19/2019   Chest pain 10/03/2018   Malnutrition of moderate degree 09/21/2018   MDD (major depressive disorder), recurrent episode, moderate    Type II diabetes mellitus with renal manifestations 05/04/2018   GERD (gastroesophageal reflux disease) 05/04/2018   S/P CABG (coronary artery bypass graft)    Left testicular pain    Depression 02/20/2018   Epididymo-orchitis, acute 02/20/2018   Essential hypertension 02/20/2018   Precordial chest pain 02/20/2018   Acute kidney injury superimposed on chronic kidney disease 02/14/2018   HCAP (healthcare-associated pneumonia) 02/14/2018   History of pulmonary embolism 07/04/2017   Acute hyponatremia 05/11/2017   Hyperglycemia due to type 2 diabetes mellitus 05/11/2017   CHF (congestive heart failure) 04/01/2017   Hepatitis C 12/30/2016   Substance induced mood disorder 11/23/2016   Chronic ischemic heart disease 11/12/2016   Paroxysmal A-fib 11/12/2016   Back pain 04/18/2016   S/P carotid endarterectomy 11/15/2015   COPD (chronic obstructive pulmonary disease) 10/22/2015   Stenosis of cervical spine with myelopathy 10/20/2015   MDD (major depressive disorder), recurrent severe, without psychosis 09/09/2015   History of fusion of cervical spine 08/28/2015   Gout 07/10/2015   Acute renal failure superimposed on stage 3 chronic kidney disease ([name]) 03/06/2015   Chronic kidney disease, stage III (moderate) 03/06/2015   Encounter for general adult medical examination with abnormal findings 02/09/2015   Major depressive disorder, recurrent, severe without psychotic  features    Suicidal ideations 08/15/2014   Cervicalgia 06/28/2014   Lumbar radiculopathy, chronic 06/28/2014   Asthma, chronic 02/03/2014   S/P percutaneous transluminal coronary angioplasty 10/15/2013   3-vessel CAD 06/24/2013   ED (erectile dysfunction) of organic origin 07/07/2012   Hypertension goal BP (blood pressure) < 140/80 04/29/2012   Chondromalacia of left knee 03/19/2012   Hyperlipidemia with target LDL less than 100 02/12/2012   Fibromyalgia 02/12/2012   Pure hypercholesterolemia 02/12/2012   Cocaine use disorder 01/10/2012    Class: Acute   Human immunodeficiency virus (HIV) disease 12/16/2011   HIV (human immunodeficiency virus infection) 08/27/2011   Uncontrolled type 2 diabetes with neuropathy 10/17/2000   PCP:  Patient, No Pcp Per Pharmacy:   [facility] [address] [place] [address] Phone: [phone] Fax: [phone]  [place] [facility] [address] [place] [address] Phone: [phone] Fax: [phone]  [facility] [place] [place] - [address] [place] [address] Phone: [phone] Fax: [phone]  [facility] [address] [place] [address] Phone: [phone] Fax: [phone]  [facility] [address] [place] [address] Phone: [phone] Fax: [phone]     Social Determinants of Health (SDOH) Social History: SDOH Screenings   Food Insecurity: Food Insecurity Present (01/17/2023)  Housing: High Risk (01/17/2023)  Transportation Needs: Unmet Transportation Needs (01/17/2023)  Utilities: Not At Risk (01/17/2023)  Recent  Concern: Utilities - At Risk (10/22/2022)  Alcohol Screen: Low Risk  (11/27/2022)  Depression (PHQ2-9): High Risk (11/12/2021)  Financial Resource Strain: Medium Risk (09/16/2018)  Physical Activity: Unknown  (04/22/2019)  Social Connections: Socially Isolated (09/16/2018)  Stress: Stress Concern Present (09/16/2018)  Tobacco Use: Low Risk  (01/17/2023)   SDOH Interventions: Housing Interventions: Inpatient TOC   Readmission Risk Interventions    07/05/2022   10:08 AM  Readmission Risk Prevention Plan  Transportation Screening Complete  Medication Review (RN Care Manager) Complete  PCP or Specialist appointment within 3-5 days of discharge Complete  HRI or Home Care Consult Complete  SW Recovery Care/Counseling Consult Complete  Palliative Care Screening Not Applicable  Skilled Nursing Facility Not Applicable

## 2023-01-17 NOTE — ED Provider Notes (Signed)
EMERGENCY DEPARTMENT AT [facility] Provider Note   CSN: [id number] Arrival date & time: 01/16/23  2310     History  Chief Complaint  Patient presents with   Chest Pain    Michael [name] is a 72 y.o. male.  The history is provided by the patient.  Chest Pain He has history of hypertension, diabetes, hyperlipidemia, coronary artery disease, pulmonary embolism, atrial fibrillation not on anticoagulation, chronic kidney disease and comes in because of chest pain.  He states that he has been having chest pain chronically ever since he had bypass surgery, but it got worse this afternoon after using crack cocaine.  He does endorse dyspnea, nausea, diaphoresis.  He denies tobacco and ethanol use.  He does admit to homelessness and states that he came here from a homeless shelter.   Home Medications Prior to Admission medications   Medication Sig Start Date End Date Taking? Authorizing Provider  Accu-Chek Softclix Lancets lancets Use as directed up to four times daily 09/06/22   Zigmund Daniel., MD  albuterol (VENTOLIN HFA) 108 (90 Base) MCG/ACT inhaler Inhale 1-2 puffs into the lungs every 6 (six) hours as needed for wheezing or shortness of breath. 10/24/22   [place], Eric J, DO  atorvastatin (LIPITOR) 40 MG tablet Take 1 tablet (40 mg total) by mouth daily. 12/09/22 04/08/23  Sarina Ill, DO  Blood Glucose Monitoring Suppl (ACCU-CHEK GUIDE) w/Device KIT Use as directed. 09/06/22   Zigmund Daniel., MD  diltiazem (CARDIZEM CD) 120 MG 24 hr capsule Take 1 capsule (120 mg total) by mouth daily. 01/12/23 02/11/23  Terrilee Files, MD  ferrous sulfate 325 (65 FE) MG tablet Take 1 tablet (325 mg total) by mouth daily with breakfast. 12/17/22   Regalado, Belkys A, MD  FLUoxetine (PROZAC) 20 MG capsule Take 1 capsule (20 mg total) by mouth daily. 12/17/22   Regalado, Belkys A, MD  fluticasone furoate-vilanterol (BREO ELLIPTA) 200-25 MCG/ACT AEPB Inhale 1 puff  into the lungs daily. 12/10/22   Sarina Ill, DO  folic acid (FOLVITE) 1 MG tablet Take 1 tablet (1 mg total) by mouth daily. 12/17/22   Regalado, Belkys A, MD  glucose blood test strip use as directed up to four times daily 09/06/22   Zigmund Daniel., MD  Insulin Pen Needle 32G X 4 MM MISC Use as directed up to 4 times daily. 09/06/22   Zigmund Daniel., MD  isosorbide mononitrate (IMDUR) 30 MG 24 hr tablet Take 0.5 tablets (15 mg total) by mouth daily. 12/10/22   Sarina Ill, DO  nitroGLYCERIN (NITROSTAT) 0.4 MG SL tablet Place 1 tablet (0.4 mg total) under the tongue every 5 (five) minutes as needed for chest pain. 12/09/22   Sarina Ill, DO  pantoprazole (PROTONIX) 40 MG tablet Take 1 tablet (40 mg total) by mouth daily. 12/17/22   Regalado, Belkys A, MD  polyethylene glycol (MIRALAX / GLYCOLAX) 17 g packet Take 17 g by mouth 2 (two) times daily. 12/17/22   Regalado, Belkys A, MD  QUEtiapine (SEROQUEL) 50 MG tablet Take 1 tablet (50 mg total) by mouth at bedtime. 12/09/22   Sarina Ill, DO  sodium bicarbonate 650 MG tablet Take 1 tablet (650 mg total) by mouth 3 (three) times daily. 12/16/22   Regalado, Prentiss Bells, MD      Allergies    Patient has no known allergies.    Review of Systems   Review of  Systems  Cardiovascular:  Positive for chest pain.  All other systems reviewed and are negative.   Physical Exam Updated Vital Signs BP (!) 161/98 (BP Location: Left Arm)   Pulse 82   Temp 98.1 F (36.7 C) (Oral)   Resp 18   Ht  (1.727 m)   Wt 69.9 kg   SpO2 100%   BMI 23.42 kg/m  Physical Exam Vitals and nursing note reviewed.   72 year old male, resting comfortably and in no acute distress. Vital signs are significant for elevated blood pressure. Oxygen saturation is 100%, which is normal. Head is normocephalic and atraumatic. PERRLA, EOMI. Oropharynx is clear. Neck is nontender and supple without adenopathy or JVD. Back is  nontender and there is no CVA tenderness. Lungs are clear without rales, wheezes, or rhonchi. Chest is nontender. Heart has regular rate and rhythm without murmur. Abdomen is soft, flat, with moderate epigastric tenderness.  There is no rebound or guarding. Extremities have no cyanosis or edema, full range of motion is present. Skin is warm and dry without rash. Neurologic: Mental status is normal, cranial nerves are intact, moves all extremities equally.  ED Results / Procedures / Treatments   Labs (all labs ordered are listed, but only abnormal results are displayed) Labs Reviewed  BASIC METABOLIC PANEL - Abnormal; Notable for the following components:      Result Value   Chloride 114 (*)    CO2 18 (*)    Glucose, Bld 143 (*)    BUN 59 (*)    Creatinine, Ser 3.40 (*)    Calcium 8.4 (*)    GFR, Estimated 18 (*)    All other components within normal limits  CBC - Abnormal; Notable for the following components:   WBC 3.9 (*)    RBC 3.17 (*)    Hemoglobin 9.1 (*)    HCT 27.6 (*)    Platelets 149 (*)    All other components within normal limits  TROPONIN I (HIGH SENSITIVITY) - Abnormal; Notable for the following components:   Troponin I (High Sensitivity) 24 (*)    All other components within normal limits  TROPONIN I (HIGH SENSITIVITY) - Abnormal; Notable for the following components:   Troponin I (High Sensitivity) 21 (*)    All other components within normal limits    EKG EKG Interpretation  Date/Time:  Thursday January 16 2023 23:16:12 EDT Ventricular Rate:  84 PR Interval:  134 QRS Duration: 86 QT Interval:  372 QTC Calculation: 439 R Axis:   61 Text Interpretation: Normal sinus rhythm Minimal voltage criteria for LVH, may be normal variant ( Sokolow-Lyon ) Nonspecific T wave abnormality Abnormal ECG When compared with ECG of 12-Jan-2023 11:36, PREVIOUS ECG IS PRESENT No significant change was found Confirmed by Glynn Octave [phone]) on 01/17/2023 12:39:31  AM  Radiology DG Chest 2 View  Result Date: 01/16/2023 CLINICAL DATA:  Left side chest pain EXAM: CHEST - 2 VIEW COMPARISON:  01/12/2023 FINDINGS: Prior median sternotomy. Heart and mediastinal contours are within normal limits. No focal opacities or effusions. No acute bony abnormality. IMPRESSION: No active cardiopulmonary disease. Electronically Signed   By: Charlett Nose M.D.   On: 01/16/2023 23:56    Procedures Procedures    Medications Ordered in ED Medications  HYDROcodone-acetaminophen (NORCO/VICODIN) 5-325 MG per tablet 1 tablet (has no administration in time range)    ED Course/ Medical Decision Making/ A&P  Medical Decision Making Amount and/or Complexity of Data Reviewed Labs: ordered. Radiology: ordered.  Risk Prescription drug management.   Chest pain which is likely cocaine related.  Doubt ACS, pulmonary embolism, aortic dissection.  I have reviewed and interpreted his electrocardiogram, and my interpretation is left ventricular hypertrophy, nonspecific T wave abnormality unchanged from prior.  Chest x-ray shows no active cardiopulmonary disease.  I have independently viewed the images, and agree with the radiologist's interpretation.  I have reviewed his laboratory test, and my interpretation is mild elevation of troponin which is stable and not significantly changed from prior, elevated BUN and creatinine similar to prior values, metabolic acidosis not significantly changed from prior, stable anemia, stable leukopenia, stable thrombocytopenia.  I have reviewed his past records, and he was admitted to the hospital on 12/11/2022 with acute kidney injury and chest pain which was felt to be musculoskeletal.  He has had multiple ED visits for chest pain and cocaine abuse.  Echocardiogram on 12/12/2022 showed normal ejection fraction, and normal left ventricular diastolic parameters.  ACS is ruled out by stable troponin x 2.  I have informed the patient  that pain is secondary to cocaine use and that he needs to abstain from cocaine.  I have ordered a dose of acetaminophen for pain and I am discharging him with instructions to abstain from cocaine.  I have also given him resource guides for substance abuse and homeless shelters.  Patient is homeless.  Final Clinical Impression(s) / ED Diagnoses Final diagnoses:  Nonspecific chest pain  Cocaine abuse  Renal insufficiency  Anemia associated with chronic renal failure  Metabolic acidosis  Elevated troponin  Leukopenia, unspecified type  Thrombocytopenia  Homeless  Elevated blood pressure reading with diagnosis of hypertension    Rx / DC Orders ED Discharge Orders     None         Dione Booze, MD 01/17/23 [phone]

## 2023-01-17 NOTE — ED Provider Triage Note (Signed)
Emergency Medicine Provider Triage Evaluation Note  Michael [name] , a 72 y.o. male  was evaluated in triage.  Pt complains of worsening of his chronic chest pain after smoking crack yesterday.  States he has chest pain chronically ever since his CABG.  Pain became worse after smoking crack.  Pain radiates to his left arm associate with shortness of breath. Also suicidal without plan..  Review of Systems  Positive: Chest pain, shortness of breath Negative:   Physical Exam  BP (!) 161/98 (BP Location: Left Arm)   Pulse 82   Temp 98.1 F (36.7 C) (Oral)   Resp 18   Ht 5\' 8"  (1.727 m)   Wt 69.9 kg   SpO2 100%   BMI 23.42 kg/m  Gen:   Awake, no distress   Resp:  Normal effort  MSK:   Moves extremities without difficulty  Other:  Equal radial pulses and PT pulses  Medical Decision Making  Medically screening exam initiated at 12:39 AM.  Appropriate orders placed.  Michael [name] was informed that the remainder of the evaluation will be completed by another provider, this initial triage assessment does not replace that evaluation, and the importance of remaining in the ED until their evaluation is complete.  Acute on chronic chest pain.  EKG without acute ischemia.   Michael Octave, MD 01/17/23 0040

## 2023-01-18 DIAGNOSIS — K859 Acute pancreatitis without necrosis or infection, unspecified: Secondary | ICD-10-CM | POA: Diagnosis not present

## 2023-01-18 DIAGNOSIS — R55 Syncope and collapse: Secondary | ICD-10-CM

## 2023-01-18 DIAGNOSIS — R45851 Suicidal ideations: Secondary | ICD-10-CM

## 2023-01-18 DIAGNOSIS — F32A Depression, unspecified: Secondary | ICD-10-CM | POA: Diagnosis not present

## 2023-01-18 LAB — GLUCOSE, CAPILLARY
Glucose-Capillary: 73 mg/dL (ref 70–99)
Glucose-Capillary: 144 mg/dL — ABNORMAL HIGH (ref 70–99)
Glucose-Capillary: 95 mg/dL (ref 70–99)
Glucose-Capillary: 124 mg/dL — ABNORMAL HIGH (ref 70–99)

## 2023-01-18 LAB — COMPREHENSIVE METABOLIC PANEL
ALT: 14 U/L (ref 0–44)
AST: 11 U/L — ABNORMAL LOW (ref 15–41)
Albumin: 2.8 g/dL — ABNORMAL LOW (ref 3.5–5.0)
Alkaline Phosphatase: 50 U/L (ref 38–126)
Anion gap: 3 — ABNORMAL LOW (ref 5–15)
BUN: 46 mg/dL — ABNORMAL HIGH (ref 8–23)
CO2: 16 mmol/L — ABNORMAL LOW (ref 22–32)
Calcium: 7.3 mg/dL — ABNORMAL LOW (ref 8.9–10.3)
Chloride: 119 mmol/L — ABNORMAL HIGH (ref 98–111)
Creatinine, Ser: 2.91 mg/dL — ABNORMAL HIGH (ref 0.61–1.24)
GFR, Estimated: 22 mL/min — ABNORMAL LOW (ref >60)
Glucose, Bld: 74 mg/dL (ref 70–99)
Potassium: 5.1 mmol/L (ref 3.5–5.1)
Sodium: 138 mmol/L (ref 135–145)
Total Bilirubin: 0.4 mg/dL (ref 0.3–1.2)
Total Protein: 5.2 g/dL — ABNORMAL LOW (ref 6.5–8.1)

## 2023-01-18 LAB — RETICULOCYTES
Immature Retic Fract: 8.2 % (ref 2.3–15.9)
RBC.: 2.49 MIL/uL — ABNORMAL LOW (ref 4.22–5.81)
Retic Count, Absolute: 22.9 10*3/uL (ref 19.0–186.0)
Retic Ct Pct: 0.9 % (ref 0.4–3.1)

## 2023-01-18 LAB — CBC
HCT: 23.1 % — ABNORMAL LOW (ref 39.0–52.0)
Hemoglobin: 7.1 g/dL — ABNORMAL LOW (ref 13.0–17.0)
MCH: 28.3 pg (ref 26.0–34.0)
MCHC: 30.7 g/dL (ref 30.0–36.0)
MCV: 92 fL (ref 80.0–100.0)
Platelets: 101 10*3/uL — ABNORMAL LOW (ref 150–400)
RBC: 2.51 MIL/uL — ABNORMAL LOW (ref 4.22–5.81)
RDW: 13.8 % (ref 11.5–15.5)
WBC: 3.4 10*3/uL — ABNORMAL LOW (ref 4.0–10.5)
nRBC: 0 % (ref 0.0–0.2)

## 2023-01-18 LAB — IRON AND TIBC
Iron: 32 ug/dL — ABNORMAL LOW (ref 45–182)
Saturation Ratios: 17 % — ABNORMAL LOW (ref 17.9–39.5)
TIBC: 193 ug/dL — ABNORMAL LOW (ref 250–450)
UIBC: 161 ug/dL

## 2023-01-18 LAB — LIPASE, BLOOD: Lipase: 54 U/L — ABNORMAL HIGH (ref 11–51)

## 2023-01-18 LAB — FERRITIN: Ferritin: 293 ng/mL (ref 24–336)

## 2023-01-18 LAB — FOLATE: Folate: 13.9 ng/mL (ref >5.9)

## 2023-01-18 LAB — VITAMIN B12: Vitamin B-12: 206 pg/mL (ref 180–914)

## 2023-01-18 NOTE — Plan of Care (Signed)
  Problem: Nutritional: Goal: Maintenance of adequate nutrition will improve Outcome: Progressing   Problem: Pain Managment: Goal: General experience of comfort will improve Outcome: Progressing   Problem: Elimination: Goal: Will not experience complications related to bowel motility Outcome: Progressing

## 2023-01-18 NOTE — TOC Progression Note (Signed)
Transition of Care [facility]) - Progression Note    Patient Details  Name: Michael [name] MRN: [id number] Date of Birth: 07-May-1951  Transition of [facility]) CM/SW Contact  Otelia Santee, LCSW Phone Number: 01/18/2023, 2:51 PM  Clinical Narrative:    Pt recommended for inpatient psychiatric placement. Pt is currently medically ready to transfer once placement has been secured. Pt will need geri-psych placement. [facility] currently do not have beds available. May have beds on Monday.  Referrals have been faxed out to the following facilities:    [facility]   [facility]   [facility]   [facility]   [facility]   [facility]   [facility]   [facility]   [facility]   [facility]   [facility]   [facility]       Expected Discharge Plan: Homeless Shelter Barriers to Discharge: No Barriers Identified  Expected Discharge Plan and Services In-house Referral: Clinical Social Work Discharge Planning Services: [place] [facility] Choice: NA Living arrangements for the past 2 months: Homeless Shelter                 DME Arranged: N/A DME Agency: NA                   Social Determinants of Health (SDOH) Interventions SDOH Screenings   Food Insecurity: Food Insecurity Present (01/17/2023)  Housing: High Risk (01/17/2023)  Transportation Needs: Unmet Transportation Needs (01/17/2023)  Utilities: Not At Risk (01/17/2023)  Recent Concern: Utilities - At Risk (10/22/2022)  Alcohol Screen: Low Risk  (11/27/2022)  Depression (PHQ2-9): High Risk (11/12/2021)  Financial Resource Strain: Medium Risk (09/16/2018)  Physical Activity: Unknown (04/22/2019)  Social Connections: Socially Isolated (09/16/2018)   Stress: Stress Concern Present (09/16/2018)  Tobacco Use: Low Risk  (01/17/2023)    Readmission Risk Interventions    07/05/2022   10:08 AM  Readmission Risk Prevention Plan  Transportation Screening Complete  Medication Review (RN Care Manager) Complete  PCP or Specialist appointment within 3-5 days of discharge Complete  HRI or Home Care Consult Complete  SW Recovery Care/Counseling Consult Complete  Palliative Care Screening Not Applicable  Skilled Nursing Facility Not Applicable

## 2023-01-18 NOTE — Consult Note (Addendum)
[facility] Face-to-Face Psychiatry Consult   Reason for Consult:  Suicidal Ideation  Referring Physician:  Dr. [place] Patient Identification: Michael [name] MRN:  [id number] Principal Diagnosis: Acute pancreatitis Diagnosis:  Principal Problem:   Acute pancreatitis Active Problems:   Hypertension goal BP (blood pressure) < 140/80   Essential hypertension   GERD (gastroesophageal reflux disease)   Chronic kidney disease, stage III (moderate)   Homelessness   DMII (diabetes mellitus, type 2)   Anemia of chronic disease   (HFpEF) heart failure with preserved ejection fraction   Polysubstance abuse   Total Time spent with patient: 45 minutes  Subjective:   Michael [name] is a 72 y.o. male patient admitted with  Chief Complaint  Patient presents with   Near Syncope     HPI:   Per Primary Team: Michael [name] is a 72 y.o. male with a history including but not limited to homelessness, cocaine abuse, paroxysmal atrial fibrillation on Cardizem, CKD stage IV, CAD status post CABG, insulin-dependent type 2 diabetes being admitted to the hospital with multiple complaints found to have possible acute pancreatitis.  Patient has had multiple recent ER visits with complaints of chest pain, including twice already this month.  He was discharged from the ER after evaluation completed likely musculoskeletal chest pain, also induced by cocaine use.  He was seen early this morning at the [facility], ER with the same complaints, and seen diagnosis.  Several hours later, he came to this emergency department complaining syncope, nausea, abdominal pain, chest pain.   On Interview: Patient seen laying in bed this afternoon accompanied by sitter at bedside. The patient reports that he has been feeling depressed and having thoughts of not living for the past 7 months. He reports that he has contemplated different ways to hurt himself but he is unable to report any specific plan during our interview. He  has seen psychiatry in the past and he is currently prescribed Prozac 20 mg PO daily and Seroquel 50 mg PO QHS. He admits that he has been minimally compliant with his treatment outside of the hospital.  The patient admits to regular crack cocaine use that he was unable to quantify. He feels like his drug use is a problem but he is minimally receptive to the idea of rehab.  The patient is unable to report anything that psychiatry could help him with outside of, "I need to clear my mind."   The patient continues to report SI at this time and denies any HI/AVH.   Past Psychiatric History: See above  Risk to Self:   Yes Risk to Others:   No Prior Inpatient Therapy:   Yes Prior Outpatient Therapy:   Yes  Past Medical History:  Past Medical History:  Diagnosis Date   A-fib    Anemia    Asthma    No PFTs, history of childhood asthma   CAD (coronary artery disease)    Cellulitis 04/2014   left facial   Cellulitis and abscess of toe of right foot 12/08/2019   Chondromalacia of medial femoral condyle    Left knee MRI 04/28/12: Chondromalacia of the medial femoral condyle with slight peripheral degeneration of the meniscocapsular junction of the medial meniscus; followed by sports medicine   Chronic kidney failure, stage 4 (severe)    Collagen vascular disease    Crack cocaine use    for 20+ years, has been enrolled in detox programs in the past   Depression    with history  of hospitalization for suicidal ideation   Diabetes mellitus 2002   Diagnosed in 2002, started insulin in 2012   Gout    Headache(784.0)    CT head 08/2011: Periventricular and subcortical white matter hypodensities are most in keeping with chronic microangiopathic change   HIV infection 08/2011   Followed by Dr. Ninetta Lights   Hyperlipidemia    Hypertension    Pulmonary embolism     Past Surgical History:  Procedure Laterality Date   AMPUTATION Right 07/21/2019   Procedure: RIGHT SECOND TOE AMPUTATION;  Surgeon:  Nadara Mustard, MD;  Location: [facility] OR;  Service: Orthopedics;  Laterality: Right;   BACK SURGERY     1988   BOWEL RESECTION     CARDIAC SURGERY     CERVICAL SPINE SURGERY     " rods in my neck "   CORONARY ARTERY BYPASS GRAFT     CORONARY STENT PLACEMENT     NM MYOCAR PERF WALL MOTION  12/27/2011   normal   SPINE SURGERY     Family Psychiatric  History:  Social History:  Social History   Substance and Sexual Activity  Alcohol Use No   Alcohol/week: 4.0 standard drinks of alcohol   Types: 2 Cans of beer, 2 Shots of liquor per week     Social History   Substance and Sexual Activity  Drug Use Not Currently   Frequency: 4.0 times per week   Types: "Crack" cocaine, Cocaine   Comment: Recent use of crack    Social History   Socioeconomic History   Marital status: Widowed    Spouse name: Not on file   Number of children: 2   Years of education: 75   Highest education level: 12th grade  Occupational History    Employer: UNEMPLOYED    Comment: 04/2016  Tobacco Use   Smoking status: Never   Smokeless tobacco: Never  Vaping Use   Vaping Use: Never used  Substance and Sexual Activity   Alcohol use: No    Alcohol/week: 4.0 standard drinks of alcohol    Types: 2 Cans of beer, 2 Shots of liquor per week   Drug use: Not Currently    Frequency: 4.0 times per week    Types: "Crack" cocaine, Cocaine    Comment: Recent use of crack   Sexual activity: Yes    Comment: DECLINED CONDOMS  Other Topics Concern   Not on file  Social History Narrative   Currently staying with a friend in [place].  Was staying @ local motel until a few days ago - left b/c of bed bugs.   P[facility] up from [facility].  Not followed by a psychiatrist.   Social Determinants of Health   Financial Resource Strain: Medium Risk (09/16/2018)   Overall Financial Resource Strain (CARDIA)    Difficulty of Paying Living Expenses: Somewhat hard  Food Insecurity: Food Insecurity Present (01/17/2023)   Hunger Vital  Sign    Worried About Running Out of Food in the Last Year: Often true    Ran Out of Food in the Last Year: Often true  Transportation Needs: Unmet Transportation Needs (01/17/2023)   PRAPARE - Administrator, Civil Service (Medical): Yes    Lack of Transportation (Non-Medical): Yes  Physical Activity: Unknown (04/22/2019)   Exercise Vital Sign    Days of Exercise per Week: Not on file    Minutes of Exercise per Session: 0 min  Stress: Stress Concern Present (09/16/2018)   [employer]  of Occupational Health - Occupational Stress Questionnaire    Feeling of Stress : Very much  Social Connections: Socially Isolated (09/16/2018)   Social Connection and Isolation Panel [NHANES]    Frequency of Communication with Friends and Family: Once a week    Frequency of Social Gatherings with Friends and Family: Never    Attends Religious Services: Never    Database administrator or Organizations: No    Attends Banker Meetings: Never    Marital Status: Widowed   Additional Social History:    Allergies:  No Known Allergies  Labs:  Results for orders placed or performed during the hospital encounter of 01/17/23 (from the past 48 hour(s))  CBC with Differential     Status: Abnormal   Collection Time: 01/17/23  8:36 AM  Result Value Ref Range   WBC 5.9 4.0 - 10.5 K/uL   RBC 3.21 (L) 4.22 - 5.81 MIL/uL   Hemoglobin 9.0 (L) 13.0 - 17.0 g/dL   HCT 16.1 (L) 09.6 - 04.5 %   MCV 88.2 80.0 - 100.0 fL   MCH 28.0 26.0 - 34.0 pg   MCHC 31.8 30.0 - 36.0 g/dL   RDW 40.9 81.1 - 91.4 %   Platelets 123 (L) 150 - 400 K/uL   nRBC 0.0 0.0 - 0.2 %   Neutrophils Relative % 89 %   Neutro Abs 5.3 1.7 - 7.7 K/uL   Lymphocytes Relative 4 %   Lymphs Abs 0.2 (L) 0.7 - 4.0 K/uL   Monocytes Relative 5 %   Monocytes Absolute 0.3 0.1 - 1.0 K/uL   Eosinophils Relative 1 %   Eosinophils Absolute 0.1 0.0 - 0.5 K/uL   Basophils Relative 0 %   Basophils Absolute 0.0 0.0 - 0.1 K/uL    Immature Granulocytes 1 %   Abs Immature Granulocytes 0.04 0.00 - 0.07 K/uL    Comment: Performed at [facility], [name] [place], [place] [address]  Comprehensive metabolic panel     Status: Abnormal   Collection Time: 01/17/23  8:36 AM  Result Value Ref Range   Sodium 140 135 - 145 mmol/L   Potassium 4.4 3.5 - 5.1 mmol/L   Chloride 112 (H) 98 - 111 mmol/L   CO2 20 (L) 22 - 32 mmol/L   Glucose, Bld 153 (H) 70 - 99 mg/dL    Comment: Glucose reference range applies only to samples taken after fasting for at least 8 hours.   BUN 70 (H) 8 - 23 mg/dL   Creatinine, Ser 6.21 (H) 0.61 - 1.24 mg/dL   Calcium 8.5 (L) 8.9 - 10.3 mg/dL   Total Protein 7.3 6.5 - 8.1 g/dL   Albumin 3.9 3.5 - 5.0 g/dL   AST 16 15 - 41 U/L   ALT 17 0 - 44 U/L   Alkaline Phosphatase 63 38 - 126 U/L   Total Bilirubin 0.4 0.3 - 1.2 mg/dL   GFR, Estimated 17 (L) >60 mL/min    Comment: (NOTE) Calculated using the CKD-EPI Creatinine Equation (2021)    Anion gap 8 5 - 15    Comment: Performed at [facility], [address] [place], [place] [address]  Lipase, blood     Status: Abnormal   Collection Time: 01/17/23  8:36 AM  Result Value Ref Range   Lipase 261 (H) 11 - 51 U/L    Comment: Performed at [facility], [address] [place], [place] [address]  Rapid urine  drug screen (hospital performed)     Status: Abnormal   Collection Time: 01/17/23 12:08 PM  Result Value Ref Range   Opiates NONE DETECTED NONE DETECTED   Cocaine POSITIVE (A) NONE DETECTED   Benzodiazepines NONE DETECTED NONE DETECTED   Amphetamines NONE DETECTED NONE DETECTED   Tetrahydrocannabinol NONE DETECTED NONE DETECTED   Barbiturates NONE DETECTED NONE DETECTED    Comment: (NOTE) DRUG SCREEN FOR MEDICAL PURPOSES ONLY.  IF CONFIRMATION IS NEEDED FOR ANY PURPOSE, NOTIFY LAB WITHIN 5 DAYS.  LOWEST DETECTABLE LIMITS FOR URINE DRUG SCREEN Drug Class                      Cutoff (ng/mL) Amphetamine and metabolites    1000 Barbiturate and metabolites    200 Benzodiazepine                 200 Opiates and metabolites        300 Cocaine and metabolites        300 THC                            50 Performed at [facility], [address], [place], [place] [address]   Glucose, capillary     Status: Abnormal   Collection Time: 01/17/23 12:17 PM  Result Value Ref Range   Glucose-Capillary 138 (H) 70 - 99 mg/dL    Comment: Glucose reference range applies only to samples taken after fasting for at least 8 hours.  Ammonia     Status: None   Collection Time: 01/17/23  1:29 PM  Result Value Ref Range   Ammonia <10 9 - 35 umol/L    Comment: Performed at [facility], [address] [place], [place] [address]  TSH     Status: None   Collection Time: 01/17/23  1:29 PM  Result Value Ref Range   TSH 1.949 0.350 - 4.500 uIU/mL    Comment: Performed by a 3rd Generation assay with a functional sensitivity of <=0.01 uIU/mL. Performed at [facility], [address], [place], [place] [address]   Ethanol     Status: None   Collection Time: 01/17/23  1:29 PM  Result Value Ref Range   Alcohol, Ethyl (B) <10 <10 mg/dL    Comment: (NOTE) Lowest detectable limit for serum alcohol is 10 mg/dL.  For medical purposes only. Performed at [facility], [address] [place], [place] [address]   CBC     Status: Abnormal   Collection Time: 01/17/23  1:29 PM  Result Value Ref Range   WBC 7.1 4.0 - 10.5 K/uL   RBC 3.21 (L) 4.22 - 5.81 MIL/uL   Hemoglobin 9.1 (L) 13.0 - 17.0 g/dL   HCT 29.5 (L) 62.1 - 30.8 %   MCV 88.2 80.0 - 100.0 fL   MCH 28.3 26.0 - 34.0 pg   MCHC 32.2 30.0 - 36.0 g/dL   RDW 65.7 84.6 - 96.2 %   Platelets 124 (L) 150 - 400 K/uL   nRBC 0.0 0.0 - 0.2 %    Comment: Performed at [facility], [address], [place], [place] [address]  Creatinine, serum      Status: Abnormal   Collection Time: 01/17/23  1:29 PM  Result Value Ref Range   Creatinine, Ser 3.29 (H) 0.61 - 1.24 mg/dL   GFR, Estimated 19 (L) >60 mL/min  Comment: (NOTE) Calculated using the CKD-EPI Creatinine Equation (2021) Performed at [facility], [address], [place], [place] [address]   Triglycerides     Status: None   Collection Time: 01/17/23  1:29 PM  Result Value Ref Range   Triglycerides 54 <150 mg/dL    Comment: Performed at [facility], [address], [place], [place] [address]  Glucose, capillary     Status: Abnormal   Collection Time: 01/17/23  4:54 PM  Result Value Ref Range   Glucose-Capillary 104 (H) 70 - 99 mg/dL    Comment: Glucose reference range applies only to samples taken after fasting for at least 8 hours.  Glucose, capillary     Status: Abnormal   Collection Time: 01/17/23  9:28 PM  Result Value Ref Range   Glucose-Capillary 137 (H) 70 - 99 mg/dL    Comment: Glucose reference range applies only to samples taken after fasting for at least 8 hours.  Lipase, blood     Status: Abnormal   Collection Time: 01/18/23  5:46 AM  Result Value Ref Range   Lipase 54 (H) 11 - 51 U/L    Comment: Performed at [facility], [address] [place], [place] [address]  Comprehensive metabolic panel     Status: Abnormal   Collection Time: 01/18/23  5:46 AM  Result Value Ref Range   Sodium 138 135 - 145 mmol/L   Potassium 5.1 3.5 - 5.1 mmol/L   Chloride 119 (H) 98 - 111 mmol/L   CO2 16 (L) 22 - 32 mmol/L   Glucose, Bld 74 70 - 99 mg/dL    Comment: Glucose reference range applies only to samples taken after fasting for at least 8 hours.   BUN 46 (H) 8 - 23 mg/dL   Creatinine, Ser 9.14 (H) 0.61 - 1.24 mg/dL   Calcium 7.3 (L) 8.9 - 10.3 mg/dL   Total Protein 5.2 (L) 6.5 - 8.1 g/dL   Albumin 2.8 (L) 3.5 - 5.0 g/dL   AST 11 (L) 15 - 41 U/L   ALT 14 0 - 44 U/L   Alkaline Phosphatase 50 38 - 126 U/L    Total Bilirubin 0.4 0.3 - 1.2 mg/dL   GFR, Estimated 22 (L) >60 mL/min    Comment: (NOTE) Calculated using the CKD-EPI Creatinine Equation (2021)    Anion gap 3 (L) 5 - 15    Comment: Performed at [facility], [address], [place], [place] [address]  CBC     Status: Abnormal   Collection Time: 01/18/23  5:46 AM  Result Value Ref Range   WBC 3.4 (L) 4.0 - 10.5 K/uL   RBC 2.51 (L) 4.22 - 5.81 MIL/uL   Hemoglobin 7.1 (L) 13.0 - 17.0 g/dL   HCT 62.1 (L) 30.8 - 65.7 %   MCV 92.0 80.0 - 100.0 fL   MCH 28.3 26.0 - 34.0 pg   MCHC 30.7 30.0 - 36.0 g/dL   RDW 84.6 96.2 - 95.2 %   Platelets 101 (L) 150 - 400 K/uL   nRBC 0.0 0.0 - 0.2 %    Comment: Performed at [facility], [address] [place], [place] [address]  Reticulocytes     Status: Abnormal   Collection Time: 01/18/23  5:46 AM  Result Value Ref Range   Retic Ct Pct 0.9 0.4 - 3.1 %   RBC. 2.49 (L) 4.22 - 5.81 MIL/uL   Retic Count, Absolute 22.9 19.0 -  186.0 K/uL   Immature Retic Fract 8.2 2.3 - 15.9 %    Comment: Performed at [facility], [address] [place], [place] [address]  Glucose, capillary     Status: None   Collection Time: 01/18/23  8:01 AM  Result Value Ref Range   Glucose-Capillary 73 70 - 99 mg/dL    Comment: Glucose reference range applies only to samples taken after fasting for at least 8 hours.   Comment 1 Notify RN   Vitamin B12     Status: None   Collection Time: 01/18/23  9:26 AM  Result Value Ref Range   Vitamin B-12 206 180 - 914 pg/mL    Comment: (NOTE) This assay is not validated for testing neonatal or myeloproliferative syndrome specimens for Vitamin B12 levels. Performed at [facility], [address] [place], [place] [address]   Folate     Status: None   Collection Time: 01/18/23  9:26 AM  Result Value Ref Range   Folate 13.9 >5.9 ng/mL    Comment: Performed at [facility], [address] [place], [place] [address]  Iron and TIBC     Status: Abnormal   Collection Time: 01/18/23  9:26 AM  Result Value Ref Range   Iron 32 (L) 45 - 182 ug/dL   TIBC 914 (L) 782 - 956 ug/dL   Saturation Ratios 17 (L) 17.9 - 39.5 %   UIBC 161 ug/dL    Comment: Performed at [facility], [address], [place], [place] [address]  Ferritin     Status: None   Collection Time: 01/18/23  9:26 AM  Result Value Ref Range   Ferritin 293 24 - 336 ng/mL    Comment: Performed at [facility], [address] [place], [place] [address]  Glucose, capillary     Status: Abnormal   Collection Time: 01/18/23 11:50 AM  Result Value Ref Range   Glucose-Capillary 144 (H) 70 - 99 mg/dL    Comment: Glucose reference range applies only to samples taken after fasting for at least 8 hours.   Comment 1 Notify RN     Current Facility-Administered Medications  Medication Dose Route Frequency Provider Last Rate Last Admin   albuterol (PROVENTIL) (2.5 MG/3ML) 0.083% nebulizer solution 2.5 mg  2.5 mg Nebulization Q2H PRN Kirby Crigler, Mir M, MD       atorvastatin (LIPITOR) tablet 40 mg  40 mg Oral Daily Kirby Crigler, Mir M, MD   40 mg at 01/18/23 0908   diltiazem (CARDIZEM CD) 24 hr capsule 120 mg  120 mg Oral Daily Kirby Crigler, Mir M, MD   120 mg at 01/18/23 0908   docusate sodium (COLACE) capsule 100 mg  100 mg Oral BID Kirby Crigler, Mir M, MD   100 mg at 01/18/23 0908   enoxaparin (LOVENOX) injection 30 mg  30 mg Subcutaneous Daily Kirby Crigler, Mir M, MD   30 mg at 01/18/23 6962   ferrous sulfate tablet 325 mg  325 mg Oral Q breakfast Kirby Crigler, Mir M, MD   325 mg at 01/18/23 0900   FLUoxetine (PROZAC) capsule 20 mg  20 mg Oral Daily Kirby Crigler, Mir M, MD   20 mg at 01/18/23 0908   fluticasone furoate-vilanterol (BREO ELLIPTA) 200-25 MCG/ACT 1 puff  1 puff Inhalation Daily Olalere, Adewale A, MD   1 puff at 01/18/23 0816   insulin aspart (novoLOG) injection 0-5 Units  0-5 Units  Subcutaneous QHS Maryln Gottron, MD  insulin aspart (novoLOG) injection 0-9 Units  0-9 Units Subcutaneous TID WC Kirby Crigler, Mir M, MD   1 Units at 01/18/23 1255   isosorbide mononitrate (IMDUR) 24 hr tablet 15 mg  15 mg Oral Daily Kirby Crigler, Mir M, MD   15 mg at 01/18/23 0908   metoprolol tartrate (LOPRESSOR) injection 5 mg  5 mg Intravenous Q6H PRN Kirby Crigler, Mir M, MD       ondansetron [facility]) tablet 4 mg  4 mg Oral Q6H PRN Kirby Crigler, Mir M, MD       Or   ondansetron [facility]) injection 4 mg  4 mg Intravenous Q6H PRN Kirby Crigler, Mir M, MD       pantoprazole (PROTONIX) EC tablet 40 mg  40 mg Oral Daily Kirby Crigler, Mir M, MD   40 mg at 01/18/23 0908   QUEtiapine (SEROQUEL) tablet 50 mg  50 mg Oral QHS Kirby Crigler, Mir M, MD   50 mg at 01/17/23 2123   sodium bicarbonate tablet 650 mg  650 mg Oral TID Maryln Gottron, MD   650 mg at 01/18/23 0908   traMADol (ULTRAM) tablet 50 mg  50 mg Oral Q8H PRN Maryln Gottron, MD         Psychiatric Specialty Exam:  Presentation  General Appearance:  Appropriate for Environment  Eye Contact: Fair  Speech: Clear and Coherent  Speech Volume: Normal  Handedness: Right   Mood and Affect  Mood: Depressed  Affect: Depressed   Thought Process  Thought Processes: Coherent  Descriptions of Associations:Intact  Orientation:Full (Time, Place and Person)  Thought Content:Logical  History of Schizophrenia/Schizoaffective disorder:No  Duration of Psychotic Symptoms:No data recorded Hallucinations:Hallucinations: None  Ideas of Reference:None  Suicidal Thoughts:Suicidal Thoughts: Yes, Passive  Homicidal Thoughts:No data recorded  Sensorium  Memory: Immediate Good  Judgment: Poor  Insight: Poor   Executive Functions  Concentration: Good  Attention Span: Good  Recall: Good  Fund of Knowledge: Poor  Language: Good   Psychomotor Activity  Psychomotor Activity:No data recorded  Assets   Assets: Desire for Improvement; Social Support; Housing   Sleep  Sleep:Sleep: Poor   Physical Exam: Physical Exam ROS Blood pressure 132/80, pulse 70, temperature 98.5 F (36.9 C), temperature source Oral, resp. rate 18, height 5\' 8"  (1.727 m), weight 69.9 kg, SpO2 100 %. Body mass index is 23.42 kg/m.  Treatment Plan Summary: Depression Unspecified -Agree with Prozac 20 mg PO daily -Agree with Seroquel 50 mg PO QHS  Stimulant Use Disorder, Cocaine -Recommend residential rehab  Disposition: Recommend psychiatric Inpatient admission when medically cleared.  Harlin Heys, DO 01/18/2023 1:28 PM

## 2023-01-18 NOTE — Progress Notes (Addendum)
PROGRESS NOTE    Michael [name]  ZOX:[id number] DOB: [birth year] DOA: 01/17/2023 PCP: Patient, No Pcp Per    Brief Narrative:   Michael [name] is a 72 y.o. male with past medical history significant for paroxysmal atrial fibrillation, CKD stage IV, CAD s/p CABG, type 2 diabetes mellitus currently diet controlled, continue cocaine abuse, homelessness who presented to [facility] ED on 4/12 complaining of passing out spells, nausea/vomiting, abdominal pain, chest pain.  With multiple ED visits with complaints of chest pain, twice this month and earlier same day of admission at [facility], ED and was discharged home.  He represented to [name], ED complaining of syncope, nausea, abdominal pain and chest pain.  Patient reports that his chest pain is localized to his left chest, nonradiating with no known alleviating or exacerbating factors.  Patient denied headache, no visual changes, no shortness of breath, no fever/chills/night sweats, diarrhea.   In the ED, temperature 98.2 F, HR 89, RR 17, BP 165/96, SpO2 98% on room air.  WBC 5.9, hemoglobin 9.0, platelets 123.  Sodium 140, potassium 4.4, chloride 112, CO2 20, glucose 153, BUN 70, creatinine 3.58, alkaline phosphatase 63, AST 16, ALT 17, total bilirubin 0.4.  Lipase 261.  High sensitive troponin that was done earlier in the day at [facility] 21.  TSH 1.949.  EtOH level less than 10.  UDS positive for cocaine.  EKG personally reviewed, normal sinus rhythm with rate 84, T wave inversions noted lead I, aVL which are similar in appearance to previous EKGs, no concerning dynamic changes.  CT abdomen/pelvis with no acute abdominal pathology, including no stranding surrounding the pancreas.  Patient was given 1 L NS bolus, Zofran.  Hospitalist consulted for further evaluation management of syncopal versus presyncopal events in the setting of continued cocaine abuse, questional pancreatitis with elevated lipase.  Assessment &  Plan:   Questionable pancreatitis Patient reports nausea/vomiting.  Lipase was noted to be elevated at 261.  Although CT abdomen/pelvis with no acute abdominal pathology including no stranding surrounding the pancreas.  Patient was started on IV fluids, liquid diet and was slowly advanced with toleration.  Triglycerides 54.  EtOH level less than 10.  Unclear etiology or if actual pancreatitis. -- Advance to soft diet today -- Tramadol 50 mg p.o. every 8 hours as needed mild pain, no further escalation of pain medications given substance abuse -- Continue supportive care, antiemetics as needed, illicit drug avoidance  Syncope versus presyncope Patient reports several episodes (# 10) of "passing out" with no prodrome.  Denies biting his tongue or loss of bowel/bladder function.  Patient is afebrile without leukocytosis.  Electrolytes within normal limits.  Continues to be positive for cocaine on UDS.  Recent TTE 12/12/2022 with LVEF 55 to 60%, normal LV diastolic parameters, LV with regional wall motion abnormalities and mild hypokinesis no mitral valve regurgitation or stenosis, no aortic stenosis, no significant change noted from prior studies.  Suspect etiology from dehydration in the setting of poor oral intake in the days preceding hospitalization with continued complication of cocaine use disorder. -- No further imaging/testing required at this time -- Continue to monitor on telemetry  Suicidal ideation Patient continues to endorse ideations for suicide with no specific plan.  Requesting inpatient psych treatment, reports he has been at [facility] inpatient psych previously. -- Psychiatry following, appreciate assistance -- Continue suicide precautions, one-to-one sitter -- Psychiatry recommending inpatient psych placement; TOC consulted  Medically stable for discharge to inpatient psych once  bed available  Paroxysmal atrial fibrillation Essential hypertension CAD s/p CABG Patient with history of  paroxysmal atrial fibrillation, currently in normal sinus rhythm.  Not on anticoagulation outpatient likely secondary to high risk given his substance abuse. -- Cardizem 120 mg p.o. daily -- Imdur 15 mg p.o. daily -- Continue monitor on telemetry  CAD s/p CABG -- Atorvastatin 40 mg p.o. daily  CKD stage IV Baseline creatinine 3.4-3.5 over the last year.  Creatinine admission 3.29, at baseline. --Sodium bicarb and 650 mg p.o. 3 times daily -- Avoid nephrotoxins, renal cell medications  Anxiety/depression -- Fluoxetine 20 mg p.o. daily -- Seroquel 50 mg p.o. nightly  Anemia of chronic medical disease/renal disease Anemia panel with iron 32, TIBC low at 193, ferritin 293, folate 13.9, vitamin B12 452. --Hgb 9.0>7.1; likely dilutional from IV fluid resuscitation on admission. -- Continue ferrous sulfate 325 mg p.o. daily  Type 2 diabetes mellitus Diet controlled at baseline.  Last hemoglobin A1c 6.8 on 09/30/2022, well-controlled. -- SSI for coverage -- CBGs qAC/HS  GERD -- Protonix 40 mg p.o. daily  Cocaine use disorder UDS positive for cocaine on admission.  Chronic issue.  TOC consulted for subs abuse resources.   DVT prophylaxis: enoxaparin (LOVENOX) injection 30 mg Start: 01/17/23 1130 SCDs Start: 01/17/23 1106    Code Status: Full Code Family Communication:   Disposition Plan:  Level of care: Telemetry Status is: Observation The patient remains OBS appropriate and will d/c before 2 midnights.    Consultants:  Psychiatry  Procedures:  None  Antimicrobials:  None   Subjective: Patient seen examined at bedside, resting comfortably.  Lying in bed.  Sitter present at bedside.  Tolerating liquid diet and wishes for further advancement today.  Denies abdominal pain.  Continues to endorse fleeting thoughts of suicide with no clear intent or plan.  Awaiting psychiatry evaluation as patient requesting inpatient psychiatric treatment.  States he has previously been at  [facility] inpatient psych.  Discussed need for complete illicit drug cessation/abstinence.  No other questions or concerns at this time.  Denies headache, no dizziness, no visual changes, no current chest pain, no palpitations, no shortness of breath, no current abdominal pain, no fever/chills/night sweats, no nausea/vomiting/diarrhea, no focal weakness, no fatigue, no paresthesias.  No acute events overnight per nursing staff.  Medically stable for discharge to inpatient psych once bed available  Objective: Vitals:   01/18/23 0052 01/18/23 0640 01/18/23 0817 01/18/23 0907  BP: 123/66 129/65  (!) 157/95  Pulse: 73 70  71  Resp: Temp: 98.3 F (36.8 C) 99.3 F (37.4 C)  99.1 F (37.3 C)  TempSrc: Oral Oral  Oral  SpO2: 100% 100% 98% 100%  Weight:      Height:        Intake/Output Summary (Last 24 hours) at 01/18/2023 1108 Last data filed at 01/18/2023 0920 Gross per 24 hour  Intake 1443 ml  Output 1150 ml  Net 293 ml   Filed Weights   01/17/23 0722  Weight: 69.9 kg    Examination:  Physical Exam: GEN: NAD, alert and oriented x 3, chronically ill in appearance, appears older than stated age HEENT: NCAT, PERRL, EOMI, sclera clear, poor dentition PULM: CTAB w/o wheezes/crackles, normal respiratory effort, on room air CV: RRR w/o M/G/R GI: abd soft, NTND, NABS, no R/G/M MSK: no peripheral edema, muscle strength globally intact 5/5 bilateral upper/lower extremities NEURO: No focal neurological deficit PSYCH: Depressed mood, flat affect, + SI, no HI Integumentary: dry/intact,  no rashes or wounds    Data Reviewed: I have personally reviewed following labs and imaging studies  CBC: Recent Labs  Lab 01/12/23 1145 01/16/23 2325 01/17/23 0836 01/17/23 1329 01/18/23 0546  WBC 3.5* 3.9* 5.9 7.1 3.4*  NEUTROABS  --   --  5.3  --   --   HGB 9.8* 9.1* 9.0* 9.1* 7.1*  HCT 29.1* 27.6* 28.3* 28.3* 23.1*  MCV 86.1 87.1 88.2 88.2 92.0  PLT 119* 149* 123* 124* 101*   Basic  Metabolic Panel: Recent Labs  Lab 01/12/23 1145 01/16/23 2325 01/17/23 0836 01/17/23 1329 01/18/23 0546  NA 141 141 140  --  138  K 4.4 4.6 4.4  --  5.1  CL 115* 114* 112*  --  119*  CO2 15* 18* 20*  --  16*  GLUCOSE 86 143* 153*  --  74  BUN 38* 59* 70*  --  46*  CREATININE 3.55* 3.40* 3.58* 3.29* 2.91*  CALCIUM 8.7* 8.4* 8.5*  --  7.3*   GFR: Estimated Creatinine Clearance: 22.2 mL/min (A) (by C-G formula based on SCr of 2.91 mg/dL (H)). Liver Function Tests: Recent Labs  Lab 01/17/23 0836 01/18/23 0546  AST 16 11*  ALT 17 14  ALKPHOS 63 50  BILITOT 0.4 0.4  PROT 7.3 5.2*  ALBUMIN 3.9 2.8*   Recent Labs  Lab 01/17/23 0836 01/18/23 0546  LIPASE 261* 54*   Recent Labs  Lab 01/17/23 1329  AMMONIA <10   Coagulation Profile: No results for input(s): "INR", "PROTIME" in the last 168 hours. Cardiac Enzymes: No results for input(s): "CKTOTAL", "CKMB", "CKMBINDEX", "TROPONINI" in the last 168 hours. BNP (last 3 results) No results for input(s): "PROBNP" in the last 8760 hours. HbA1C: No results for input(s): "HGBA1C" in the last 72 hours. CBG: Recent Labs  Lab 01/17/23 1217 01/17/23 1654 01/17/23 2128 01/18/23 0801  GLUCAP 138* 104* 137* 73   Lipid Profile: Recent Labs    01/17/23 1329  TRIG 54   Thyroid Function Tests: Recent Labs    01/17/23 1329  TSH 1.949   Anemia Panel: Recent Labs    01/18/23 0546  RETICCTPCT 0.9   Sepsis Labs: No results for input(s): "PROCALCITON", "LATICACIDVEN" in the last 168 hours.  No results found for this or any previous visit (from the past 240 hour(s)).       Radiology Studies: CT ABDOMEN PELVIS WO CONTRAST  Result Date: 01/17/2023 CLINICAL DATA:  Abdominal pain EXAM: CT ABDOMEN AND PELVIS WITHOUT CONTRAST TECHNIQUE: Multidetector CT imaging of the abdomen and pelvis was performed following the standard protocol without IV contrast. RADIATION DOSE REDUCTION: This exam was performed according to the  departmental dose-optimization program which includes automated exposure control, adjustment of the mA and/or kV according to patient size and/or use of iterative reconstruction technique. COMPARISON:  01/09/2023 FINDINGS: Lower chest: Dependent subsegmental atelectasis or scarring medial right base. No pericardial or pleural effusion. Hepatobiliary: No focal liver abnormality is seen. No gallstones, gallbladder wall thickening, or biliary dilatation. Pancreas: Unremarkable. No pancreatic ductal dilatation or surrounding inflammatory changes. Spleen: Normal in size without focal abnormality. Adrenals/Urinary Tract: Adrenal glands are unremarkable. Kidneys are normal, without renal calculi, focal lesion, or hydronephrosis. Bladder is unremarkable. Stomach/Bowel: Stomach is within normal limits. Appendix appears normal. No evidence of bowel wall thickening, distention, or inflammatory changes. Vascular/Lymphatic: Aortic atherosclerosis. No enlarged abdominal or pelvic lymph nodes. Reproductive: Findings in the prostate suggest prior TURP. Other: No abdominal wall hernia or abnormality. No abdominopelvic ascites. Musculoskeletal:  Lumbosacral degenerative changes with vacuum disc changes and marginal osteophytes. Grade 1 L5 retrolisthesis. IMPRESSION: No acute abdominal or pelvic pathology identified. Electronically Signed   By: Layla Maw M.D.   On: 01/17/2023 10:16   DG Chest 2 View  Result Date: 01/16/2023 CLINICAL DATA:  Left side chest pain EXAM: CHEST - 2 VIEW COMPARISON:  01/12/2023 FINDINGS: Prior median sternotomy. Heart and mediastinal contours are within normal limits. No focal opacities or effusions. No acute bony abnormality. IMPRESSION: No active cardiopulmonary disease. Electronically Signed   By: Charlett Nose M.D.   On: 01/16/2023 23:56        Scheduled Meds:  atorvastatin  40 mg Oral Daily   diltiazem  120 mg Oral Daily   docusate sodium  100 mg Oral BID   enoxaparin (LOVENOX)  injection  30 mg Subcutaneous Daily   ferrous sulfate  325 mg Oral Q breakfast   FLUoxetine  20 mg Oral Daily   fluticasone furoate-vilanterol  1 puff Inhalation Daily   insulin aspart  0-5 Units Subcutaneous QHS   insulin aspart  0-9 Units Subcutaneous TID WC   isosorbide mononitrate  15 mg Oral Daily   pantoprazole  40 mg Oral Daily   QUEtiapine  50 mg Oral QHS   sodium bicarbonate  650 mg Oral TID   Continuous Infusions:   LOS: 0 days    Time spent: 51 minutes spent on chart review, discussion with nursing staff, consultants, updating family and interview/physical exam; more than 50% of that time was spent in counseling and/or coordination of care.    Alvira Philips [place], DO [facility] Available via Epic secure chat 7am-7pm After these hours, please refer to coverage provider listed on [email or web address] 01/18/2023, 11:08 AM

## 2023-01-19 DIAGNOSIS — K859 Acute pancreatitis without necrosis or infection, unspecified: Secondary | ICD-10-CM | POA: Diagnosis not present

## 2023-01-19 DIAGNOSIS — F32A Depression, unspecified: Secondary | ICD-10-CM | POA: Diagnosis not present

## 2023-01-19 DIAGNOSIS — R45851 Suicidal ideations: Secondary | ICD-10-CM | POA: Diagnosis not present

## 2023-01-19 LAB — GLUCOSE, CAPILLARY
Glucose-Capillary: 95 mg/dL (ref 70–99)
Glucose-Capillary: 113 mg/dL — ABNORMAL HIGH (ref 70–99)
Glucose-Capillary: 167 mg/dL — ABNORMAL HIGH (ref 70–99)
Glucose-Capillary: 113 mg/dL — ABNORMAL HIGH (ref 70–99)

## 2023-01-19 NOTE — Consult Note (Signed)
[facility] Face-to-Face Psychiatry Consult   Reason for Consult:  Suicidal Ideation  Referring Physician:  Dr. [place] Patient Identification: Michael [name] MRN:  [id number] Principal Diagnosis: Acute pancreatitis Diagnosis:  Principal Problem:   Acute pancreatitis Active Problems:   Hypertension goal BP (blood pressure) < 140/80   Essential hypertension   GERD (gastroesophageal reflux disease)   Chronic kidney disease, stage III (moderate)   Homelessness   DMII (diabetes mellitus, type 2)   Anemia of chronic disease   (HFpEF) heart failure with preserved ejection fraction   Polysubstance abuse   Total Time spent with patient: 45 minutes  Subjective:   Michael [name] is a 72 y.o. male patient admitted with  Chief Complaint  Patient presents with   Near Syncope     HPI:   Per Primary Team: Michael [name] is a 72 y.o. male with a history including but not limited to homelessness, cocaine abuse, paroxysmal atrial fibrillation on Cardizem, CKD stage IV, CAD status post CABG, insulin-dependent type 2 diabetes being admitted to the hospital with multiple complaints found to have possible acute pancreatitis.  Patient has had multiple recent ER visits with complaints of chest pain, including twice already this month.  He was discharged from the ER after evaluation completed likely musculoskeletal chest pain, also induced by cocaine use.  He was seen early this morning at the [facility], ER with the same complaints, and seen diagnosis.  Several hours later, he came to this emergency department complaining syncope, nausea, abdominal pain, chest pain.   On Interview 01/18/2023: Patient seen laying in bed this afternoon accompanied by sitter at bedside. The patient reports that he has been feeling depressed and having thoughts of not living for the past 7 months. He reports that he has contemplated different ways to hurt himself but he is unable to report any specific plan during our  interview. He has seen psychiatry in the past and he is currently prescribed Prozac 20 mg PO daily and Seroquel 50 mg PO QHS. He admits that he has been minimally compliant with his treatment outside of the hospital.  The patient admits to regular crack cocaine use that he was unable to quantify. He feels like his drug use is a problem but he is minimally receptive to the idea of rehab.  The patient is unable to report anything that psychiatry could help him with outside of, "I need to clear my mind."   The patient continues to report SI at this time and denies any HI/AVH.  01/19/2023 Patient seen sitting up in bed accompanied by sitter this afternoon on my approach. He is seen eating crackers and peanut butter. He reports that he is still feeling depressed today and having thoughts of not living. He also states that he has had a poor appetite but he has been eating 100% of his meals. The patient has been compliant with his medications and denies any issues or side effects.  Past Psychiatric History: See above  Risk to Self:   Yes Risk to Others:   No Prior Inpatient Therapy:   Yes Prior Outpatient Therapy:   Yes  Past Medical History:  Past Medical History:  Diagnosis Date   A-fib    Anemia    Asthma    No PFTs, history of childhood asthma   CAD (coronary artery disease)    Cellulitis 04/2014   left facial   Cellulitis and abscess of toe of right foot 12/08/2019   Chondromalacia of medial femoral  condyle    Left knee MRI 04/28/12: Chondromalacia of the medial femoral condyle with slight peripheral degeneration of the meniscocapsular junction of the medial meniscus; followed by sports medicine   Chronic kidney failure, stage 4 (severe)    Collagen vascular disease    Crack cocaine use    for 20+ years, has been enrolled in detox programs in the past   Depression    with history of hospitalization for suicidal ideation   Diabetes mellitus 2002   Diagnosed in 2002, started insulin  in 2012   Gout    Headache(784.0)    CT head 08/2011: Periventricular and subcortical white matter hypodensities are most in keeping with chronic microangiopathic change   HIV infection 08/2011   Followed by Dr. Ninetta Lights   Hyperlipidemia    Hypertension    Pulmonary embolism     Past Surgical History:  Procedure Laterality Date   AMPUTATION Right 07/21/2019   Procedure: RIGHT SECOND TOE AMPUTATION;  Surgeon: Nadara Mustard, MD;  Location: [facility] OR;  Service: Orthopedics;  Laterality: Right;   BACK SURGERY     1988   BOWEL RESECTION     CARDIAC SURGERY     CERVICAL SPINE SURGERY     " rods in my neck "   CORONARY ARTERY BYPASS GRAFT     CORONARY STENT PLACEMENT     NM MYOCAR PERF WALL MOTION  12/27/2011   normal   SPINE SURGERY     Family Psychiatric  History:  Social History:  Social History   Substance and Sexual Activity  Alcohol Use No   Alcohol/week: 4.0 standard drinks of alcohol   Types: 2 Cans of beer, 2 Shots of liquor per week     Social History   Substance and Sexual Activity  Drug Use Not Currently   Frequency: 4.0 times per week   Types: "Crack" cocaine, Cocaine   Comment: Recent use of crack    Social History   Socioeconomic History   Marital status: Widowed    Spouse name: Not on file   Number of children: 2   Years of education: 59   Highest education level: 12th grade  Occupational History    Employer: UNEMPLOYED    Comment: 04/2016  Tobacco Use   Smoking status: Never   Smokeless tobacco: Never  Vaping Use   Vaping Use: Never used  Substance and Sexual Activity   Alcohol use: No    Alcohol/week: 4.0 standard drinks of alcohol    Types: 2 Cans of beer, 2 Shots of liquor per week   Drug use: Not Currently    Frequency: 4.0 times per week    Types: "Crack" cocaine, Cocaine    Comment: Recent use of crack   Sexual activity: Yes    Comment: DECLINED CONDOMS  Other Topics Concern   Not on file  Social History Narrative   Currently staying  with a friend in [place].  Was staying @ local motel until a few days ago - left b/c of bed bugs.   P[facility] up [facility].  Not followed by a psychiatrist.   Social Determinants of Health   Financial Resource Strain: Medium Risk (09/16/2018)   Overall Financial Resource Strain (CARDIA)    Difficulty of Paying Living Expenses: Somewhat hard  Food Insecurity: Food Insecurity Present (01/17/2023)   Hunger Vital Sign    Worried About Running Out of Food in the Last Year: Often true    Ran Out of Food in the  Last Year: Often true  Transportation Needs: Unmet Transportation Needs (01/17/2023)   PRAPARE - Transportation    Lack of Transportation (Medical): Yes    Lack of Transportation (Non-Medical): Yes  Physical Activity: Unknown (04/22/2019)   Exercise Vital Sign    Days of Exercise per Week: Not on file    Minutes of Exercise per Session: 0 min  Stress: Stress Concern Present (09/16/2018)   [employer] of Occupational Health - Occupational Stress Questionnaire    Feeling of Stress : Very much  Social Connections: Socially Isolated (09/16/2018)   Social Connection and Isolation Panel [NHANES]    Frequency of Communication with Friends and Family: Once a week    Frequency of Social Gatherings with Friends and Family: Never    Attends Religious Services: Never    Database administrator or Organizations: No    Attends Banker Meetings: Never    Marital Status: Widowed   Additional Social History:    Allergies:  No Known Allergies  Labs:  Results for orders placed or performed during the hospital encounter of 01/17/23 (from the past 48 hour(s))  Glucose, capillary     Status: Abnormal   Collection Time: 01/17/23  4:54 PM  Result Value Ref Range   Glucose-Capillary 104 (H) 70 - 99 mg/dL    Comment: Glucose reference range applies only to samples taken after fasting for at least 8 hours.  Glucose, capillary     Status: Abnormal   Collection Time: 01/17/23  9:28  PM  Result Value Ref Range   Glucose-Capillary 137 (H) 70 - 99 mg/dL    Comment: Glucose reference range applies only to samples taken after fasting for at least 8 hours.  Lipase, blood     Status: Abnormal   Collection Time: 01/18/23  5:46 AM  Result Value Ref Range   Lipase 54 (H) 11 - 51 U/L    Comment: Performed at [facility], [address], [place], [place] [address]  Comprehensive metabolic panel     Status: Abnormal   Collection Time: 01/18/23  5:46 AM  Result Value Ref Range   Sodium 138 135 - 145 mmol/L   Potassium 5.1 3.5 - 5.1 mmol/L   Chloride 119 (H) 98 - 111 mmol/L   CO2 16 (L) 22 - 32 mmol/L   Glucose, Bld 74 70 - 99 mg/dL    Comment: Glucose reference range applies only to samples taken after fasting for at least 8 hours.   BUN 46 (H) 8 - 23 mg/dL   Creatinine, Ser 6.04 (H) 0.61 - 1.24 mg/dL   Calcium 7.3 (L) 8.9 - 10.3 mg/dL   Total Protein 5.2 (L) 6.5 - 8.1 g/dL   Albumin 2.8 (L) 3.5 - 5.0 g/dL   AST 11 (L) 15 - 41 U/L   ALT 14 0 - 44 U/L   Alkaline Phosphatase 50 38 - 126 U/L   Total Bilirubin 0.4 0.3 - 1.2 mg/dL   GFR, Estimated 22 (L) >60 mL/min    Comment: (NOTE) Calculated using the CKD-EPI Creatinine Equation (2021)    Anion gap 3 (L) 5 - 15    Comment: Performed at [facility], [address] [place], [place] [address]  CBC     Status: Abnormal   Collection Time: 01/18/23  5:46 AM  Result Value Ref Range   WBC 3.4 (L) 4.0 - 10.5 K/uL   RBC 2.51 (L) 4.22 - 5.81 MIL/uL   Hemoglobin 7.1 (L)  13.0 - 17.0 g/dL   HCT 16.1 (L) 09.6 - 04.5 %   MCV 92.0 80.0 - 100.0 fL   MCH 28.3 26.0 - 34.0 pg   MCHC 30.7 30.0 - 36.0 g/dL   RDW 40.9 81.1 - 91.4 %   Platelets 101 (L) 150 - 400 K/uL   nRBC 0.0 0.0 - 0.2 %    Comment: Performed at [facility], [address], [place], [place] [address]  Reticulocytes     Status: Abnormal   Collection Time: 01/18/23  5:46 AM  Result Value Ref Range   Retic Ct  Pct 0.9 0.4 - 3.1 %   RBC. 2.49 (L) 4.22 - 5.81 MIL/uL   Retic Count, Absolute 22.9 19.0 - 186.0 K/uL   Immature Retic Fract 8.2 2.3 - 15.9 %    Comment: Performed at [facility], [address] [place], [place] [address]  Glucose, capillary     Status: None   Collection Time: 01/18/23  8:01 AM  Result Value Ref Range   Glucose-Capillary 73 70 - 99 mg/dL    Comment: Glucose reference range applies only to samples taken after fasting for at least 8 hours.   Comment 1 Notify RN   Vitamin B12     Status: None   Collection Time: 01/18/23  9:26 AM  Result Value Ref Range   Vitamin B-12 206 180 - 914 pg/mL    Comment: (NOTE) This assay is not validated for testing neonatal or myeloproliferative syndrome specimens for Vitamin B12 levels. Performed at [facility], [address], [place], [place] [address]   Folate     Status: None   Collection Time: 01/18/23  9:26 AM  Result Value Ref Range   Folate 13.9 >5.9 ng/mL    Comment: Performed at [facility], [address], [place], [place] [address]  Iron and TIBC     Status: Abnormal   Collection Time: 01/18/23  9:26 AM  Result Value Ref Range   Iron 32 (L) 45 - 182 ug/dL   TIBC 952 (L) 841 - 324 ug/dL   Saturation Ratios 17 (L) 17.9 - 39.5 %   UIBC 161 ug/dL    Comment: Performed at [facility], [address] [place], [place] [address]  Ferritin     Status: None   Collection Time: 01/18/23  9:26 AM  Result Value Ref Range   Ferritin 293 24 - 336 ng/mL    Comment: Performed at [facility], [address] [place], [place] [address]  Glucose, capillary     Status: Abnormal   Collection Time: 01/18/23 11:50 AM  Result Value Ref Range   Glucose-Capillary 144 (H) 70 - 99 mg/dL    Comment: Glucose reference range applies only to samples taken after fasting for at least 8 hours.   Comment 1 Notify RN   Glucose, capillary     Status: None    Collection Time: 01/18/23  3:51 PM  Result Value Ref Range   Glucose-Capillary 95 70 - 99 mg/dL    Comment: Glucose reference range applies only to samples taken after fasting for at least 8 hours.   Comment 1 Notify RN   Glucose, capillary     Status: Abnormal   Collection Time: 01/18/23  7:51 PM  Result Value Ref Range   Glucose-Capillary 124 (H) 70 - 99 mg/dL    Comment: Glucose reference range applies only to samples taken after fasting  for at least 8 hours.  Glucose, capillary     Status: None   Collection Time: 01/19/23  7:48 AM  Result Value Ref Range   Glucose-Capillary 95 70 - 99 mg/dL    Comment: Glucose reference range applies only to samples taken after fasting for at least 8 hours.  Glucose, capillary     Status: Abnormal   Collection Time: 01/19/23 11:46 AM  Result Value Ref Range   Glucose-Capillary 113 (H) 70 - 99 mg/dL    Comment: Glucose reference range applies only to samples taken after fasting for at least 8 hours.   Comment 1 Notify RN     Current Facility-Administered Medications  Medication Dose Route Frequency Provider Last Rate Last Admin   albuterol (PROVENTIL) (2.5 MG/3ML) 0.083% nebulizer solution 2.5 mg  2.5 mg Nebulization Q2H PRN Kirby Crigler, Mir M, MD       atorvastatin (LIPITOR) tablet 40 mg  40 mg Oral Daily Kirby Crigler, Mir M, MD   40 mg at 01/19/23 1000   diltiazem (CARDIZEM CD) 24 hr capsule 120 mg  120 mg Oral Daily Kirby Crigler, Mir M, MD   120 mg at 01/19/23 1000   docusate sodium (COLACE) capsule 100 mg  100 mg Oral BID Kirby Crigler, Mir M, MD   100 mg at 01/19/23 1000   enoxaparin (LOVENOX) injection 30 mg  30 mg Subcutaneous Daily Kirby Crigler, Mir M, MD   30 mg at 01/19/23 1000   ferrous sulfate tablet 325 mg  325 mg Oral Q breakfast Kirby Crigler, Mir M, MD   325 mg at 01/19/23 0755   FLUoxetine (PROZAC) capsule 20 mg  20 mg Oral Daily Kirby Crigler, Mir M, MD   20 mg at 01/19/23 1000   fluticasone furoate-vilanterol (BREO ELLIPTA) 200-25 MCG/ACT 1  puff  1 puff Inhalation Daily Olalere, Adewale A, MD   1 puff at 01/19/23 0757   insulin aspart (novoLOG) injection 0-5 Units  0-5 Units Subcutaneous QHS Kirby Crigler, Mir M, MD       insulin aspart (novoLOG) injection 0-9 Units  0-9 Units Subcutaneous TID WC Kirby Crigler, Mir M, MD   1 Units at 01/18/23 1255   isosorbide mononitrate (IMDUR) 24 hr tablet 15 mg  15 mg Oral Daily Kirby Crigler, Mir M, MD   15 mg at 01/19/23 0959   metoprolol tartrate (LOPRESSOR) injection 5 mg  5 mg Intravenous Q6H PRN Kirby Crigler, Mir M, MD       ondansetron [facility]) tablet 4 mg  4 mg Oral Q6H PRN Kirby Crigler, Mir M, MD       Or   ondansetron (ZOFRAN) injection 4 mg  4 mg Intravenous Q6H PRN Kirby Crigler, Mir M, MD       pantoprazole (PROTONIX) EC tablet 40 mg  40 mg Oral Daily Kirby Crigler, Mir M, MD   40 mg at 01/19/23 1000   QUEtiapine (SEROQUEL) tablet 50 mg  50 mg Oral QHS Kirby Crigler, Mir M, MD   50 mg at 01/18/23 2120   sodium bicarbonate tablet 650 mg  650 mg Oral TID Maryln Gottron, MD   650 mg at 01/19/23 0959   traMADol (ULTRAM) tablet 50 mg  50 mg Oral Q8H PRN Maryln Gottron, MD         Psychiatric Specialty Exam:  Presentation  General Appearance:  Appropriate for Environment  Eye Contact: Good  Speech: Clear and Coherent  Speech Volume: Normal  Handedness: Right   Mood and Affect  Mood: Depressed  Affect: Non-Congruent   Thought Process  Thought  Processes: Coherent  Descriptions of Associations:Intact  Orientation:Full (Time, Place and Person)  Thought Content:Logical  History of Schizophrenia/Schizoaffective disorder:No  Duration of Psychotic Symptoms:No data recorded Hallucinations:Hallucinations: None  Ideas of Reference:None  Suicidal Thoughts:Suicidal Thoughts: Yes, Passive SI Passive Intent and/or Plan: Without Plan  Homicidal Thoughts:Homicidal Thoughts: No  Sensorium  Memory: Immediate Good  Judgment: Poor  Insight: Poor   Executive Functions   Concentration: Good  Attention Span: Good  Recall: Good  Fund of Knowledge: Good  Language: Good   Psychomotor Activity  Psychomotor Activity:No data recorded  Assets  Assets: Desire for Improvement; Social Support; Housing   Sleep  Sleep:Sleep: Fair   Physical Exam: Physical Exam ROS Blood pressure (!) 152/83, pulse 79, temperature 98.2 F (36.8 C), temperature source Oral, resp. rate 18, height  (1.727 m), weight 69.9 kg, SpO2 100 %. Body mass index is 23.42 kg/m.  Treatment Plan Summary: Depression Unspecified -Continue Prozac 20 mg PO daily -Continue Seroquel 50 mg PO QHS  Stimulant Use Disorder, Cocaine -Recommend residential rehab  Disposition: Recommend psychiatric Inpatient admission when medically cleared. Psychiatry will continue to follow  Harlin Heys, DO 01/19/2023 3:02 PM

## 2023-01-19 NOTE — Progress Notes (Signed)
PROGRESS NOTE    Michael [name]  HID:[id number] DOB: 1951-06-28 DOA: 01/17/2023 PCP: Patient, No Pcp Per    Brief Narrative:   Michael [name] is a 72 y.o. male with past medical history significant for paroxysmal atrial fibrillation, CKD stage IV, CAD s/p CABG, type 2 diabetes mellitus currently diet controlled, continue cocaine abuse, homelessness who presented to [facility] ED on 4/12 complaining of passing out spells, nausea/vomiting, abdominal pain, chest pain.  With multiple ED visits with complaints of chest pain, twice this month and earlier same day of admission at [facility], ED and was discharged home.  He represented to [name], ED complaining of syncope, nausea, abdominal pain and chest pain.  Patient reports that his chest pain is localized to his left chest, nonradiating with no known alleviating or exacerbating factors.  Patient denied headache, no visual changes, no shortness of breath, no fever/chills/night sweats, diarrhea.   In the ED, temperature 98.2 F, HR 89, RR 17, BP 165/96, SpO2 98% on room air.  WBC 5.9, hemoglobin 9.0, platelets 123.  Sodium 140, potassium 4.4, chloride 112, CO2 20, glucose 153, BUN 70, creatinine 3.58, alkaline phosphatase 63, AST 16, ALT 17, total bilirubin 0.4.  Lipase 261.  High sensitive troponin that was done earlier in the day at [facility] 21.  TSH 1.949.  EtOH level less than 10.  UDS positive for cocaine.  EKG personally reviewed, normal sinus rhythm with rate 84, T wave inversions noted lead I, aVL which are similar in appearance to previous EKGs, no concerning dynamic changes.  CT abdomen/pelvis with no acute abdominal pathology, including no stranding surrounding the pancreas.  Patient was given 1 L NS bolus, Zofran.  Hospitalist consulted for further evaluation management of syncopal versus presyncopal events in the setting of continued cocaine abuse, questional pancreatitis with elevated lipase.  Assessment &  Plan:   Questionable pancreatitis Patient reports nausea/vomiting.  Lipase was noted to be elevated at 261.  Although CT abdomen/pelvis with no acute abdominal pathology including no stranding surrounding the pancreas.  Patient was started on IV fluids, liquid diet and was slowly advanced with toleration.  Triglycerides 54.  EtOH level less than 10.  Unclear etiology or if actual pancreatitis. -- Advance to heart healthy/consistent carbohydrate diet today -- Tramadol 50 mg p.o. q8h PRN, mild pain, no further escalation of pain medications given substance abuse -- Continue supportive care, antiemetics as needed, illicit drug avoidance  Syncope versus presyncope Patient reports several episodes (# 10) of "passing out" with no prodrome.  Denies biting his tongue or loss of bowel/bladder function.  Patient is afebrile without leukocytosis.  Electrolytes within normal limits.  Continues to be positive for cocaine on UDS.  Recent TTE 12/12/2022 with LVEF 55 to 60%, normal LV diastolic parameters, LV with regional wall motion abnormalities and mild hypokinesis no mitral valve regurgitation or stenosis, no aortic stenosis, no significant change noted from prior studies.  Suspect etiology from dehydration in the setting of poor oral intake in the days preceding hospitalization with continued complication of cocaine use disorder. -- No further imaging/testing required at this time -- Continue to monitor on telemetry  Suicidal ideation Patient continues to endorse ideations for suicide with no specific plan.  Requesting inpatient psych treatment, reports he has been at [facility] inpatient psych previously. -- Psychiatry following, appreciate assistance -- Continue suicide precautions, one-to-one sitter -- Psychiatry recommending inpatient psych placement; TOC consulted  Medically stable for discharge to inpatient psych once bed  available  Paroxysmal atrial fibrillation Essential hypertension CAD s/p  CABG Patient with history of paroxysmal atrial fibrillation, currently in normal sinus rhythm.  Not on anticoagulation outpatient likely secondary to high risk given his substance abuse. -- Cardizem 120 mg p.o. daily -- Imdur 15 mg p.o. daily -- Continue monitor on telemetry  CAD s/p CABG -- Atorvastatin 40 mg p.o. daily  CKD stage IV Baseline creatinine 3.4-3.5 over the last year.  Creatinine admission 3.29, at baseline. --Sodium bicarb and 650 mg p.o. 3 times daily -- Avoid nephrotoxins, renal cell medications  Anxiety/depression -- Fluoxetine 20 mg p.o. daily -- Seroquel 50 mg p.o. nightly  Anemia of chronic medical disease/renal disease Anemia panel with iron 32, TIBC low at 193, ferritin 293, folate 13.9, vitamin B12 452. --Hgb 9.0>7.1; likely dilutional from IV fluid resuscitation on admission. -- Continue ferrous sulfate 325 mg p.o. daily  Type 2 diabetes mellitus Diet controlled at baseline.  Last hemoglobin A1c 6.8 on 09/30/2022, well-controlled. -- SSI for coverage -- CBGs qAC/HS  GERD -- Protonix 40 mg p.o. daily  Cocaine use disorder UDS positive for cocaine on admission.  Chronic issue.  TOC consulted for substance abuse resources.   DVT prophylaxis: enoxaparin (LOVENOX) injection 30 mg Start: 01/17/23 1130 SCDs Start: 01/17/23 1106    Code Status: Full Code Family Communication:   Disposition Plan:  Level of care: Med-Surg Status is: Observation The patient remains OBS appropriate and will d/c before 2 midnights.    Consultants:  Psychiatry  Procedures:  None  Antimicrobials:  None   Subjective: Patient seen examined at bedside, resting comfortably.  Lying in bed.  Eating breakfast.  Sitter present at bedside.  Continues to report intermittent feelings of suicide without active plan.  Seen by psychiatry yesterday with recommendations of inpatient psych treatment at discharge.  No other questions or concerns at this time.  Denies headache, no  dizziness, no visual changes, no current chest pain, no palpitations, no shortness of breath, no current abdominal pain, no fever/chills/night sweats, no nausea/vomiting/diarrhea, no focal weakness, no fatigue, no paresthesias.  No acute events overnight per nursing staff.  Medically stable for discharge to inpatient psych once bed available  Objective: Vitals:   01/18/23 1155 01/18/23 1944 01/18/23 1947 01/19/23 0601  BP: 132/80 (!) 184/91 (!) 169/93 (!) 153/75  Pulse: 70 76 76 76  Resp: 18 16  18   Temp: 98.5 F (36.9 C) 98.6 F (37 C)  98.5 F (36.9 C)  TempSrc: Oral Oral  Oral  SpO2: 100% 100% 100% 100%  Weight:      Height:        Intake/Output Summary (Last 24 hours) at 01/19/2023 1140 Last data filed at 01/19/2023 0900 Gross per 24 hour  Intake 1266 ml  Output 1600 ml  Net -334 ml   Filed Weights   01/17/23 0722  Weight: 69.9 kg    Examination:  Physical Exam: GEN: NAD, alert and oriented x 3, chronically ill in appearance, appears older than stated age HEENT: NCAT, PERRL, EOMI, sclera clear, poor dentition PULM: CTAB w/o wheezes/crackles, normal respiratory effort, on room air CV: RRR w/o M/G/R GI: abd soft, NTND, NABS, no R/G/M MSK: no peripheral edema, muscle strength globally intact 5/5 bilateral upper/lower extremities NEURO: No focal neurological deficit PSYCH: Depressed mood, flat affect, + SI, no HI Integumentary: dry/intact, no rashes or wounds    Data Reviewed: I have personally reviewed following labs and imaging studies  CBC: Recent Labs  Lab 01/12/23 1145 01/16/23 2325  01/17/23 0836 01/17/23 1329 01/18/23 0546  WBC 3.5* 3.9* 5.9 7.1 3.4*  NEUTROABS  --   --  5.3  --   --   HGB 9.8* 9.1* 9.0* 9.1* 7.1*  HCT 29.1* 27.6* 28.3* 28.3* 23.1*  MCV 86.1 87.1 88.2 88.2 92.0  PLT 119* 149* 123* 124* 101*   Basic Metabolic Panel: Recent Labs  Lab 01/12/23 1145 01/16/23 2325 01/17/23 0836 01/17/23 1329 01/18/23 0546  NA 141 141 140  --  138   K 4.4 4.6 4.4  --  5.1  CL 115* 114* 112*  --  119*  CO2 15* 18* 20*  --  16*  GLUCOSE 86 143* 153*  --  74  BUN 38* 59* 70*  --  46*  CREATININE 3.55* 3.40* 3.58* 3.29* 2.91*  CALCIUM 8.7* 8.4* 8.5*  --  7.3*   GFR: Estimated Creatinine Clearance: 22.2 mL/min (A) (by C-G formula based on SCr of 2.91 mg/dL (H)). Liver Function Tests: Recent Labs  Lab 01/17/23 0836 01/18/23 0546  AST 16 11*  ALT 17 14  ALKPHOS 63 50  BILITOT 0.4 0.4  PROT 7.3 5.2*  ALBUMIN 3.9 2.8*   Recent Labs  Lab 01/17/23 0836 01/18/23 0546  LIPASE 261* 54*   Recent Labs  Lab 01/17/23 1329  AMMONIA <10   Coagulation Profile: No results for input(s): "INR", "PROTIME" in the last 168 hours. Cardiac Enzymes: No results for input(s): "CKTOTAL", "CKMB", "CKMBINDEX", "TROPONINI" in the last 168 hours. BNP (last 3 results) No results for input(s): "PROBNP" in the last 8760 hours. HbA1C: No results for input(s): "HGBA1C" in the last 72 hours. CBG: Recent Labs  Lab 01/18/23 0801 01/18/23 1150 01/18/23 1551 01/18/23 1951 01/19/23 0748  GLUCAP 73 144* 95 124* 95   Lipid Profile: Recent Labs    01/17/23 1329  TRIG 54   Thyroid Function Tests: Recent Labs    01/17/23 1329  TSH 1.949   Anemia Panel: Recent Labs    01/18/23 0546 01/18/23 0926  VITAMINB12  --  206  FOLATE  --  13.9  FERRITIN  --  293  TIBC  --  193*  IRON  --  32*  RETICCTPCT 0.9  --    Sepsis Labs: No results for input(s): "PROCALCITON", "LATICACIDVEN" in the last 168 hours.  No results found for this or any previous visit (from the past 240 hour(s)).       Radiology Studies: No results found.      Scheduled Meds:  atorvastatin  40 mg Oral Daily   diltiazem  120 mg Oral Daily   docusate sodium  100 mg Oral BID   enoxaparin (LOVENOX) injection  30 mg Subcutaneous Daily   ferrous sulfate  325 mg Oral Q breakfast   FLUoxetine  20 mg Oral Daily   fluticasone furoate-vilanterol  1 puff Inhalation  Daily   insulin aspart  0-5 Units Subcutaneous QHS   insulin aspart  0-9 Units Subcutaneous TID WC   isosorbide mononitrate  15 mg Oral Daily   pantoprazole  40 mg Oral Daily   QUEtiapine  50 mg Oral QHS   sodium bicarbonate  650 mg Oral TID   Continuous Infusions:   LOS: 0 days    Time spent: 51 minutes spent on chart review, discussion with nursing staff, consultants, updating family and interview/physical exam; more than 50% of that time was spent in counseling and/or coordination of care.    Alvira Philips [place], DO [facility] Available via Epic secure  chat 7am-7pm After these hours, please refer to coverage provider listed on [email or web address] 01/19/2023, 11:40 AM

## 2023-01-20 DIAGNOSIS — R45851 Suicidal ideations: Secondary | ICD-10-CM | POA: Diagnosis not present

## 2023-01-20 DIAGNOSIS — K859 Acute pancreatitis without necrosis or infection, unspecified: Secondary | ICD-10-CM | POA: Diagnosis not present

## 2023-01-20 DIAGNOSIS — F1410 Cocaine abuse, uncomplicated: Secondary | ICD-10-CM | POA: Diagnosis not present

## 2023-01-20 LAB — GLUCOSE, CAPILLARY
Glucose-Capillary: 99 mg/dL (ref 70–99)
Glucose-Capillary: 216 mg/dL — ABNORMAL HIGH (ref 70–99)
Glucose-Capillary: 100 mg/dL — ABNORMAL HIGH (ref 70–99)
Glucose-Capillary: 202 mg/dL — ABNORMAL HIGH (ref 70–99)

## 2023-01-20 MED ORDER — DILTIAZEM HCL ER COATED BEADS 180 MG PO CP24
180.0000 mg | ORAL_CAPSULE | Freq: Every day | ORAL | Status: DC
  Administered 2023-01-20 – 2023-01-25 (×6): 180 mg via ORAL
  Filled 2023-01-20 (×6): qty 1

## 2023-01-20 NOTE — Progress Notes (Signed)
Mobility Specialist - Progress Note   01/20/23 1317  Mobility  Activity Ambulated with assistance in hallway  Level of Assistance Standby assist, set-up cues, supervision of patient - no hands on  Assistive Device Front wheel walker  Distance Ambulated (ft) 100 ft  Activity Response Tolerated well  Mobility Referral Yes  $Mobility charge 1 Mobility   Pt received in bed and agreed to mobility, had no issues throughout session. Pt returned to bed with all needs met and staff in room.   Marilynne Halsted Mobility Specialist

## 2023-01-20 NOTE — Progress Notes (Signed)
PROGRESS NOTE    Michael [name]  HID:[id number] DOB: 1951-06-28 DOA: 01/17/2023 PCP: Patient, No Pcp Per    Brief Narrative:   Michael [name] is a 72 y.o. male with past medical history significant for paroxysmal atrial fibrillation, CKD stage IV, CAD s/p CABG, type 2 diabetes mellitus currently diet controlled, continue cocaine abuse, homelessness who presented to [facility] ED on 4/12 complaining of passing out spells, nausea/vomiting, abdominal pain, chest pain.  With multiple ED visits with complaints of chest pain, twice this month and earlier same day of admission at [facility], ED and was discharged home.  He represented to [name], ED complaining of syncope, nausea, abdominal pain and chest pain.  Patient reports that his chest pain is localized to his left chest, nonradiating with no known alleviating or exacerbating factors.  Patient denied headache, no visual changes, no shortness of breath, no fever/chills/night sweats, diarrhea.   In the ED, temperature 98.2 F, HR 89, RR 17, BP 165/96, SpO2 98% on room air.  WBC 5.9, hemoglobin 9.0, platelets 123.  Sodium 140, potassium 4.4, chloride 112, CO2 20, glucose 153, BUN 70, creatinine 3.58, alkaline phosphatase 63, AST 16, ALT 17, total bilirubin 0.4.  Lipase 261.  High sensitive troponin that was done earlier in the day at [facility] 21.  TSH 1.949.  EtOH level less than 10.  UDS positive for cocaine.  EKG personally reviewed, normal sinus rhythm with rate 84, T wave inversions noted lead I, aVL which are similar in appearance to previous EKGs, no concerning dynamic changes.  CT abdomen/pelvis with no acute abdominal pathology, including no stranding surrounding the pancreas.  Patient was given 1 L NS bolus, Zofran.  Hospitalist consulted for further evaluation management of syncopal versus presyncopal events in the setting of continued cocaine abuse, questional pancreatitis with elevated lipase.  Assessment &  Plan:   Questionable pancreatitis Patient reports nausea/vomiting.  Lipase was noted to be elevated at 261.  Although CT abdomen/pelvis with no acute abdominal pathology including no stranding surrounding the pancreas.  Patient was started on IV fluids, liquid diet and was slowly advanced with toleration.  Triglycerides 54.  EtOH level less than 10.  Unclear etiology or if actual pancreatitis. -- Advance to heart healthy/consistent carbohydrate diet today -- Tramadol 50 mg p.o. q8h PRN, mild pain, no further escalation of pain medications given substance abuse -- Continue supportive care, antiemetics as needed, illicit drug avoidance  Syncope versus presyncope Patient reports several episodes (# 10) of "passing out" with no prodrome.  Denies biting his tongue or loss of bowel/bladder function.  Patient is afebrile without leukocytosis.  Electrolytes within normal limits.  Continues to be positive for cocaine on UDS.  Recent TTE 12/12/2022 with LVEF 55 to 60%, normal LV diastolic parameters, LV with regional wall motion abnormalities and mild hypokinesis no mitral valve regurgitation or stenosis, no aortic stenosis, no significant change noted from prior studies.  Suspect etiology from dehydration in the setting of poor oral intake in the days preceding hospitalization with continued complication of cocaine use disorder. -- No further imaging/testing required at this time -- Continue to monitor on telemetry  Suicidal ideation Patient continues to endorse ideations for suicide with no specific plan.  Requesting inpatient psych treatment, reports he has been at [facility] inpatient psych previously. -- Psychiatry following, appreciate assistance -- Continue suicide precautions, one-to-one sitter -- Psychiatry recommending inpatient psych placement; TOC consulted  Medically stable for discharge to inpatient psych once bed  available  Paroxysmal atrial fibrillation Essential hypertension CAD s/p  CABG Patient with history of paroxysmal atrial fibrillation, currently in normal sinus rhythm.  Not on anticoagulation outpatient likely secondary to high risk given his substance abuse. -- Cardizem increased to 180 mg p.o. daily -- Imdur 15 mg p.o. daily -- Continue monitor on telemetry  CAD s/p CABG -- Atorvastatin 40 mg p.o. daily  CKD stage IV Baseline creatinine 3.4-3.5 over the last year.  Creatinine admission 3.29, at baseline. --Sodium bicarb and 650 mg p.o. 3 times daily -- Avoid nephrotoxins, renal cell medications  Anxiety/depression -- Fluoxetine 20 mg p.o. daily -- Seroquel 50 mg p.o. nightly  Anemia of chronic medical disease/renal disease Anemia panel with iron 32, TIBC low at 193, ferritin 293, folate 13.9, vitamin B12 452. --Hgb 9.0>7.1; likely dilutional from IV fluid resuscitation on admission. -- Continue ferrous sulfate 325 mg p.o. daily  Type 2 diabetes mellitus Diet controlled at baseline.  Last hemoglobin A1c 6.8 on 09/30/2022, well-controlled. -- SSI for coverage -- CBGs qAC/HS  GERD -- Protonix 40 mg p.o. daily  Cocaine use disorder UDS positive for cocaine on admission.  Chronic issue.  TOC consulted for substance abuse resources.   DVT prophylaxis: enoxaparin (LOVENOX) injection 30 mg Start: 01/17/23 1130 SCDs Start: 01/17/23 1106    Code Status: Full Code Family Communication:   Disposition Plan:  Level of care: Med-Surg Status is: Observation The patient remains OBS appropriate and will d/c before 2 midnights.    Consultants:  Psychiatry  Procedures:  None  Antimicrobials:  None   Subjective: Patient seen examined at bedside, resting comfortably.  Lying in bed.  Just finished eating breakfast.  Sitter present at bedside.  Abdominal pain resolved and tolerating diet. Continues to report intermittent feelings of suicide without active plan. No other questions or concerns at this time.  Denies headache, no dizziness, no visual  changes, no current chest pain, no palpitations, no shortness of breath, no current abdominal pain, no fever/chills/night sweats, no nausea/vomiting/diarrhea, no focal weakness, no fatigue, no paresthesias.  No acute events overnight per nursing staff.  Medically stable for discharge to inpatient psych once bed available  Objective: Vitals:   01/19/23 1413 01/19/23 2009 01/20/23 0538 01/20/23 0835  BP: (!) 152/83 (!) 152/83 (!) 178/98   Pulse: 79 81 75   Resp: Temp: 98.2 F (36.8 C) 99.2 F (37.3 C) 98.4 F (36.9 C)   TempSrc: Oral Oral Oral   SpO2: 100% 100% 99% 98%  Weight:      Height:        Intake/Output Summary (Last 24 hours) at 01/20/2023 1311 Last data filed at 01/20/2023 0900 Gross per 24 hour  Intake 840 ml  Output 1900 ml  Net -1060 ml   Filed Weights   01/17/23 0722  Weight: 69.9 kg    Examination:  Physical Exam: GEN: NAD, alert and oriented x 3, chronically ill in appearance, appears older than stated age HEENT: NCAT, PERRL, EOMI, sclera clear, poor dentition PULM: CTAB w/o wheezes/crackles, normal respiratory effort, on room air CV: RRR w/o M/G/R GI: abd soft, NTND, NABS, no R/G/M MSK: no peripheral edema, muscle strength globally intact 5/5 bilateral upper/lower extremities NEURO: No focal neurological deficit PSYCH: Depressed mood, flat affect, + SI, no HI Integumentary: dry/intact, no rashes or wounds    Data Reviewed: I have personally reviewed following labs and imaging studies  CBC: Recent Labs  Lab 01/16/23 2325 01/17/23 0836 01/17/23 1329 01/18/23 0546  WBC 3.9* 5.9 7.1 3.4*  NEUTROABS  --  5.3  --   --   HGB 9.1* 9.0* 9.1* 7.1*  HCT 27.6* 28.3* 28.3* 23.1*  MCV 87.1 88.2 88.2 92.0  PLT 149* 123* 124* 101*   Basic Metabolic Panel: Recent Labs  Lab 01/16/23 2325 01/17/23 0836 01/17/23 1329 01/18/23 0546  NA 141 140  --  138  K 4.6 4.4  --  5.1  CL 114* 112*  --  119*  CO2 18* 20*  --  16*  GLUCOSE 143* 153*  --   74  BUN 59* 70*  --  46*  CREATININE 3.40* 3.58* 3.29* 2.91*  CALCIUM 8.4* 8.5*  --  7.3*   GFR: Estimated Creatinine Clearance: 22.2 mL/min (A) (by C-G formula based on SCr of 2.91 mg/dL (H)). Liver Function Tests: Recent Labs  Lab 01/17/23 0836 01/18/23 0546  AST 16 11*  ALT 17 14  ALKPHOS 63 50  BILITOT 0.4 0.4  PROT 7.3 5.2*  ALBUMIN 3.9 2.8*   Recent Labs  Lab 01/17/23 0836 01/18/23 0546  LIPASE 261* 54*   Recent Labs  Lab 01/17/23 1329  AMMONIA <10   Coagulation Profile: No results for input(s): "INR", "PROTIME" in the last 168 hours. Cardiac Enzymes: No results for input(s): "CKTOTAL", "CKMB", "CKMBINDEX", "TROPONINI" in the last 168 hours. BNP (last 3 results) No results for input(s): "PROBNP" in the last 8760 hours. HbA1C: No results for input(s): "HGBA1C" in the last 72 hours. CBG: Recent Labs  Lab 01/19/23 1146 01/19/23 1633 01/19/23 2015 01/20/23 0800 01/20/23 1118  GLUCAP 113* 167* 113* 99 216*   Lipid Profile: Recent Labs    01/17/23 1329  TRIG 54   Thyroid Function Tests: Recent Labs    01/17/23 1329  TSH 1.949   Anemia Panel: Recent Labs    01/18/23 0546 01/18/23 0926  VITAMINB12  --  206  FOLATE  --  13.9  FERRITIN  --  293  TIBC  --  193*  IRON  --  32*  RETICCTPCT 0.9  --    Sepsis Labs: No results for input(s): "PROCALCITON", "LATICACIDVEN" in the last 168 hours.  No results found for this or any previous visit (from the past 240 hour(s)).       Radiology Studies: No results found.      Scheduled Meds:  atorvastatin  40 mg Oral Daily   diltiazem  180 mg Oral Daily   docusate sodium  100 mg Oral BID   enoxaparin (LOVENOX) injection  30 mg Subcutaneous Daily   ferrous sulfate  325 mg Oral Q breakfast   FLUoxetine  20 mg Oral Daily   fluticasone furoate-vilanterol  1 puff Inhalation Daily   insulin aspart  0-5 Units Subcutaneous QHS   insulin aspart  0-9 Units Subcutaneous TID WC   isosorbide  mononitrate  15 mg Oral Daily   pantoprazole  40 mg Oral Daily   QUEtiapine  50 mg Oral QHS   sodium bicarbonate  650 mg Oral TID   Continuous Infusions:   LOS: 0 days    Time spent: 51 minutes spent on chart review, discussion with nursing staff, consultants, updating family and interview/physical exam; more than 50% of that time was spent in counseling and/or coordination of care.    Alvira Philips [place], DO [facility] Available via Epic secure chat 7am-7pm After these hours, please refer to coverage provider listed on [email or web address] 01/20/2023, 1:11 PM

## 2023-01-20 NOTE — Progress Notes (Signed)
Mobility Specialist - Progress Note   01/20/23 0858  Mobility  Activity Ambulated with assistance in hallway  Level of Assistance Contact guard assist, steadying assist  Assistive Device Front wheel walker  Distance Ambulated (ft) 50 ft  Activity Response Tolerated fair  Mobility Referral Yes  $Mobility charge 1 Mobility   Pt received in bed and agreed to mobility. Mod I for sit to stand and bed mobility, pt was dizzy upon standing, dizziness never faded, causing session to be cut short.  Pt returned to bed with all needs met and and staff in room, will try to further session if dizziness subsides.   Marilynne Halsted Mobility Specialist

## 2023-01-20 NOTE — TOC Progression Note (Signed)
Transition of Care [facility]) - Progression Note    Patient Details  Name: Michael [name] MRN: [id number] Date of Birth: 07/13/1951  Transition of [facility]) CM/SW Contact  Otelia Santee, LCSW Phone Number: 01/20/2023, 2:38 PM  Clinical Narrative:    CSW contacted the following psych facilities for geri-psychiatric placement:  [facility]- No bed availability [facility]- Declined due to medical acuity [facility]- Declined due to medial acuity [facility]- No bed availability. Do not review until they have beds.  [facility]- No availability.  [facility]- Left voicemail  [place]- Declined by provider [facility]- Left voicemail [facility]- Declined due to medical acuity [facility] intake through [place]. Per [facility] if pt accepted they will reach out. If pt no accepted will not receive call.  [place]- Central intake through [place]. Per [facility] if pt accepted they will reach out. If pt no accepted will not receive call.  [place]- Central intake through [place]. Per [facility] if pt accepted they will reach out. If pt no accepted will not receive call.  [facility]- Left voicemail.    Expected Discharge Plan: Psychiatric Hospital Barriers to Discharge: No Barriers Identified  Expected Discharge Plan and Services In-house Referral: Clinical Social Work Discharge Planning Services: NA Post Acute Care Choice: NA Living arrangements for the past 2 months: Homeless Shelter                 DME Arranged: N/A DME Agency: NA                   Social Determinants of Health (SDOH) Interventions SDOH Screenings   Food Insecurity: Food Insecurity Present (01/17/2023)  Housing: High Risk (01/17/2023)  Transportation Needs: Unmet Transportation Needs (01/17/2023)  Utilities: Not At Risk (01/17/2023)  Recent Concern: Utilities - At Risk (10/22/2022)  Alcohol Screen: Low Risk  (11/27/2022)  Depression (PHQ2-9): High Risk (11/12/2021)  Financial Resource Strain:  Medium Risk (09/16/2018)  Physical Activity: Unknown (04/22/2019)  Social Connections: Socially Isolated (09/16/2018)  Stress: Stress Concern Present (09/16/2018)  Tobacco Use: Low Risk  (01/17/2023)    Readmission Risk Interventions    07/05/2022   10:08 AM  Readmission Risk Prevention Plan  Transportation Screening Complete  Medication Review (RN Care Manager) Complete  PCP or Specialist appointment within 3-5 days of discharge Complete  HRI or Home Care Consult Complete  SW Recovery Care/Counseling Consult Complete  Palliative Care Screening Not Applicable  Skilled Nursing Facility Not Applicable

## 2023-01-21 DIAGNOSIS — F1914 Other psychoactive substance abuse with psychoactive substance-induced mood disorder: Secondary | ICD-10-CM | POA: Diagnosis not present

## 2023-01-21 DIAGNOSIS — R45851 Suicidal ideations: Secondary | ICD-10-CM | POA: Diagnosis not present

## 2023-01-21 DIAGNOSIS — K859 Acute pancreatitis without necrosis or infection, unspecified: Secondary | ICD-10-CM | POA: Diagnosis not present

## 2023-01-21 DIAGNOSIS — Z5900 Homelessness unspecified: Secondary | ICD-10-CM | POA: Diagnosis not present

## 2023-01-21 LAB — GLUCOSE, CAPILLARY
Glucose-Capillary: 114 mg/dL — ABNORMAL HIGH (ref 70–99)
Glucose-Capillary: 105 mg/dL — ABNORMAL HIGH (ref 70–99)
Glucose-Capillary: 274 mg/dL — ABNORMAL HIGH (ref 70–99)
Glucose-Capillary: 76 mg/dL (ref 70–99)

## 2023-01-21 MED ORDER — HYDRALAZINE HCL 25 MG PO TABS
25.0000 mg | ORAL_TABLET | Freq: Three times a day (TID) | ORAL | Status: DC
  Administered 2023-01-21 – 2023-01-25 (×14): 25 mg via ORAL
  Filled 2023-01-21 (×14): qty 1

## 2023-01-21 NOTE — Plan of Care (Signed)
  Problem: Education: Goal: Ability to describe self-care measures that may prevent or decrease complications (Diabetes Survival Skills Education) will improve Outcome: Progressing Goal: Individualized Educational Video(s) Outcome: Progressing   Problem: Coping: Goal: Ability to adjust to condition or change in health will improve Outcome: Progressing   Problem: Fluid Volume: Goal: Ability to maintain a balanced intake and output will improve Outcome: Progressing   Problem: Metabolic: Goal: Ability to maintain appropriate glucose levels will improve Outcome: Progressing   Problem: Nutritional: Goal: Maintenance of adequate nutrition will improve Outcome: Progressing Goal: Progress toward achieving an optimal weight will improve Outcome: Progressing   Problem: Skin Integrity: Goal: Risk for impaired skin integrity will decrease Outcome: Progressing   Problem: Tissue Perfusion: Goal: Adequacy of tissue perfusion will improve Outcome: Progressing   Problem: Education: Goal: Knowledge of General Education information will improve Description: Including pain rating scale, medication(s)/side effects and non-pharmacologic comfort measures Outcome: Progressing   Problem: Health Behavior/Discharge Planning: Goal: Ability to manage health-related needs will improve Outcome: Progressing   Problem: Clinical Measurements: Goal: Ability to maintain clinical measurements within normal limits will improve Outcome: Progressing Goal: Will remain free from infection Outcome: Progressing Goal: Diagnostic test results will improve Outcome: Progressing Goal: Respiratory complications will improve Outcome: Progressing Goal: Cardiovascular complication will be avoided Outcome: Progressing   Problem: Activity: Goal: Risk for activity intolerance will decrease Outcome: Progressing   Problem: Nutrition: Goal: Adequate nutrition will be maintained Outcome: Progressing   Problem:  Coping: Goal: Level of anxiety will decrease Outcome: Progressing   Problem: Elimination: Goal: Will not experience complications related to bowel motility Outcome: Progressing Goal: Will not experience complications related to urinary retention Outcome: Progressing   Problem: Pain Managment: Goal: General experience of comfort will improve Outcome: Progressing   Problem: Safety: Goal: Ability to remain free from injury will improve Outcome: Progressing   Problem: Skin Integrity: Goal: Risk for impaired skin integrity will decrease Outcome: Progressing   

## 2023-01-21 NOTE — Progress Notes (Signed)
Mobility Specialist - Progress Note   01/21/23 1523  Mobility  Activity Ambulated with assistance in hallway  Level of Assistance Modified independent, requires aide device or extra time  Assistive Device Front wheel walker  Distance Ambulated (ft) 120 ft  Activity Response Tolerated well  Mobility Referral Yes  $Mobility charge 1 Mobility   Pt received in bed and agreeable to mobility. No complaints during session. Pt to EOB after session with all needs met. Sitter in room.   [facility]

## 2023-01-21 NOTE — Progress Notes (Signed)
PROGRESS NOTE    Michael [name]  RUE:[id number] DOB: [birth year] DOA: 01/17/2023 PCP: Patient, No Pcp Per    Brief Narrative:   Michael [name] is a 72 y.o. male with past medical history significant for paroxysmal atrial fibrillation, CKD stage IV, CAD s/p CABG, type 2 diabetes mellitus currently diet controlled, continue cocaine abuse, homelessness who presented to [facility] ED on 4/12 complaining of passing out spells, nausea/vomiting, abdominal pain, chest pain.  With multiple ED visits with complaints of chest pain, twice this month and earlier same day of admission at [facility], ED and was discharged home.  He represented to [name], ED complaining of syncope, nausea, abdominal pain and chest pain.  Patient reports that his chest pain is localized to his left chest, nonradiating with no known alleviating or exacerbating factors.  Patient denied headache, no visual changes, no shortness of breath, no fever/chills/night sweats, diarrhea.   In the ED, temperature 98.2 F, HR 89, RR 17, BP 165/96, SpO2 98% on room air.  WBC 5.9, hemoglobin 9.0, platelets 123.  Sodium 140, potassium 4.4, chloride 112, CO2 20, glucose 153, BUN 70, creatinine 3.58, alkaline phosphatase 63, AST 16, ALT 17, total bilirubin 0.4.  Lipase 261.  High sensitive troponin that was done earlier in the day at [name] 21.  TSH 1.949.  EtOH level less than 10.  UDS positive for cocaine.  EKG personally reviewed, normal sinus rhythm with rate 84, T wave inversions noted lead I, aVL which are similar in appearance to previous EKGs, no concerning dynamic changes.  CT abdomen/pelvis with no acute abdominal pathology, including no stranding surrounding the pancreas.  Patient was given 1 L NS bolus, Zofran.  Hospitalist consulted for further evaluation management of syncopal versus presyncopal events in the setting of continued cocaine abuse, questional pancreatitis with elevated lipase.  Assessment &  Plan:   Questionable pancreatitis Patient reports nausea/vomiting.  Lipase was noted to be elevated at 261.  Although CT abdomen/pelvis with no acute abdominal pathology including no stranding surrounding the pancreas.  Patient was started on IV fluids, liquid diet and was slowly advanced with toleration.  Triglycerides 54.  EtOH level less than 10.  Unclear etiology or if actual pancreatitis. -- Tolerating diet -- Tramadol 50 mg p.o. q8h PRN, mild pain, no further escalation of pain medications given substance abuse -- Continue supportive care, antiemetics as needed, illicit drug avoidance  Syncope versus presyncope Patient reports several episodes (# 10) of "passing out" with no prodrome.  Denies biting his tongue or loss of bowel/bladder function.  Patient is afebrile without leukocytosis.  Electrolytes within normal limits.  Continues to be positive for cocaine on UDS.  Recent TTE 12/12/2022 with LVEF 55 to 60%, normal LV diastolic parameters, LV with regional wall motion abnormalities and mild hypokinesis no mitral valve regurgitation or stenosis, no aortic stenosis, no significant change noted from prior studies.  Suspect etiology from dehydration in the setting of poor oral intake in the days preceding hospitalization with continued complication of cocaine use disorder. -- No further imaging/testing required at this time -- Continue to monitor on telemetry  Suicidal ideation Patient continues to endorse ideations for suicide with no specific plan.  Requesting inpatient psych treatment, reports he has been at [facility] inpatient psych previously. -- Psychiatry following, appreciate assistance -- Continue suicide precautions, one-to-one sitter -- Psychiatry recommending inpatient psych placement; TOC consulted  Medically stable for discharge to inpatient psych once bed available  Paroxysmal atrial fibrillation  Essential hypertension CAD s/p CABG Patient with history of paroxysmal atrial  fibrillation, currently in normal sinus rhythm.  Not on anticoagulation outpatient likely secondary to high risk given his substance abuse. -- Cardizem increased to 180 mg p.o. daily -- Imdur 15 mg p.o. daily -- Started on hydralazine 25 mg p.o. every 8 hours -- Continue monitor on telemetry  CAD s/p CABG -- Atorvastatin 40 mg p.o. daily  CKD stage IV Baseline creatinine 3.4-3.5 over the last year.  Creatinine admission 3.29, at baseline. --Sodium bicarb and 650 mg p.o. 3 times daily -- Avoid nephrotoxins, renal cell medications  Anxiety/depression -- Fluoxetine 20 mg p.o. daily -- Seroquel 50 mg p.o. nightly  Anemia of chronic medical disease/renal disease Anemia panel with iron 32, TIBC low at 193, ferritin 293, folate 13.9, vitamin B12 452. -- Hgb 9.0>7.1; likely dilutional from IV fluid resuscitation on admission. -- Continue ferrous sulfate 325 mg p.o. daily  Type 2 diabetes mellitus Diet controlled at baseline.  Last hemoglobin A1c 6.8 on 09/30/2022, well-controlled. -- SSI for coverage -- CBGs qAC/HS  GERD -- Protonix 40 mg p.o. daily  Cocaine use disorder UDS positive for cocaine on admission.  Chronic issue.  TOC consulted for substance abuse resources.   DVT prophylaxis: enoxaparin (LOVENOX) injection 30 mg Start: 01/17/23 1130 SCDs Start: 01/17/23 1106    Code Status: Full Code Family Communication: No family present at bedside  Disposition Plan:  Level of care: Med-Surg Status is: Observation The patient remains OBS appropriate and will d/c before 2 midnights.    Consultants:  Psychiatry  Procedures:  None  Antimicrobials:  None   Subjective: Patient seen examined at bedside, resting comfortably.  Lying in bed.  Leaping but easy arousable.  Ate breakfast this morning; no issues.  Sitter present at bedside.  Continues to have fleeting thoughts about suicidal ideations without active plan.  No other specific questions, concerns or complaints at this  time.  Denies headache, no dizziness, no visual changes, no current chest pain, no palpitations, no shortness of breath, no current abdominal pain, no fever/chills/night sweats, no nausea/vomiting/diarrhea, no focal weakness, no fatigue, no paresthesias.  No acute events overnight per nursing staff.  Medically stable for discharge to inpatient psych once bed available  Objective: Vitals:   01/20/23 0835 01/20/23 1317 01/20/23 1915 01/21/23 0658  BP:  (!) 166/91 (!) 160/83 (!) 170/84  Pulse:  80 71 69  Resp:  17 18 16   Temp:  98.2 F (36.8 C) 98.2 F (36.8 C) 98.6 F (37 C)  TempSrc:  Oral Oral Oral  SpO2: 98% 100% 100% 100%  Weight:      Height:        Intake/Output Summary (Last 24 hours) at 01/21/2023 1139 Last data filed at 01/21/2023 0800 Gross per 24 hour  Intake 1200 ml  Output 1600 ml  Net -400 ml   Filed Weights   01/17/23 0722  Weight: 69.9 kg    Examination:  Physical Exam: GEN: NAD, alert and oriented x 3, chronically ill in appearance, appears older than stated age HEENT: NCAT, PERRL, EOMI, sclera clear, poor dentition PULM: CTAB w/o wheezes/crackles, normal respiratory effort, on room air CV: RRR w/o M/G/R GI: abd soft, NTND, NABS, no R/G/M MSK: no peripheral edema, muscle strength globally intact 5/5 bilateral upper/lower extremities NEURO: No focal neurological deficit PSYCH: Depressed mood, flat affect, + SI, no HI Integumentary: dry/intact, no rashes or wounds    Data Reviewed: I have personally reviewed following labs and imaging  studies  CBC: Recent Labs  Lab 01/16/23 2325 01/17/23 0836 01/17/23 1329 01/18/23 0546  WBC 3.9* 5.9 7.1 3.4*  NEUTROABS  --  5.3  --   --   HGB 9.1* 9.0* 9.1* 7.1*  HCT 27.6* 28.3* 28.3* 23.1*  MCV 87.1 88.2 88.2 92.0  PLT 149* 123* 124* 101*   Basic Metabolic Panel: Recent Labs  Lab 01/16/23 2325 01/17/23 0836 01/17/23 1329 01/18/23 0546  NA 141 140  --  138  K 4.6 4.4  --  5.1  CL 114* 112*  --  119*   CO2 18* 20*  --  16*  GLUCOSE 143* 153*  --  74  BUN 59* 70*  --  46*  CREATININE 3.40* 3.58* 3.29* 2.91*  CALCIUM 8.4* 8.5*  --  7.3*   GFR: Estimated Creatinine Clearance: 22.2 mL/min (A) (by C-G formula based on SCr of 2.91 mg/dL (H)). Liver Function Tests: Recent Labs  Lab 01/17/23 0836 01/18/23 0546  AST 16 11*  ALT 17 14  ALKPHOS 63 50  BILITOT 0.4 0.4  PROT 7.3 5.2*  ALBUMIN 3.9 2.8*   Recent Labs  Lab 01/17/23 0836 01/18/23 0546  LIPASE 261* 54*   Recent Labs  Lab 01/17/23 1329  AMMONIA <10   Coagulation Profile: No results for input(s): "INR", "PROTIME" in the last 168 hours. Cardiac Enzymes: No results for input(s): "CKTOTAL", "CKMB", "CKMBINDEX", "TROPONINI" in the last 168 hours. BNP (last 3 results) No results for input(s): "PROBNP" in the last 8760 hours. HbA1C: No results for input(s): "HGBA1C" in the last 72 hours. CBG: Recent Labs  Lab 01/20/23 0800 01/20/23 1118 01/20/23 1651 01/20/23 2040 01/21/23 0743  GLUCAP 99 216* 100* 202* 114*   Lipid Profile: No results for input(s): "CHOL", "HDL", "LDLCALC", "TRIG", "CHOLHDL", "LDLDIRECT" in the last 72 hours.  Thyroid Function Tests: No results for input(s): "TSH", "T4TOTAL", "FREET4", "T3FREE", "THYROIDAB" in the last 72 hours.  Anemia Panel: No results for input(s): "VITAMINB12", "FOLATE", "FERRITIN", "TIBC", "IRON", "RETICCTPCT" in the last 72 hours.  Sepsis Labs: No results for input(s): "PROCALCITON", "LATICACIDVEN" in the last 168 hours.  No results found for this or any previous visit (from the past 240 hour(s)).       Radiology Studies: No results found.      Scheduled Meds:  atorvastatin  40 mg Oral Daily   diltiazem  180 mg Oral Daily   docusate sodium  100 mg Oral BID   enoxaparin (LOVENOX) injection  30 mg Subcutaneous Daily   ferrous sulfate  325 mg Oral Q breakfast   FLUoxetine  20 mg Oral Daily   fluticasone furoate-vilanterol  1 puff Inhalation Daily    hydrALAZINE  25 mg Oral Q8H   insulin aspart  0-5 Units Subcutaneous QHS   insulin aspart  0-9 Units Subcutaneous TID WC   isosorbide mononitrate  15 mg Oral Daily   pantoprazole  40 mg Oral Daily   QUEtiapine  50 mg Oral QHS   sodium bicarbonate  650 mg Oral TID   Continuous Infusions:   LOS: 0 days    Time spent: 46 minutes spent on chart review, discussion with nursing staff, consultants, updating family and interview/physical exam; more than 50% of that time was spent in counseling and/or coordination of care.    Alvira Philips [place], DO [facility] Available via Epic secure chat 7am-7pm After these hours, please refer to coverage provider listed on [email or web address] 01/21/2023, 11:39 AM

## 2023-01-21 NOTE — Consult Note (Signed)
[facility] Face-to-Face Psychiatry Consult   Reason for Consult:  Suicidal Ideation  Referring Physician:  Dr. U[place] Patient Identification: Michael [name] MRN:  [id number] Principal Diagnosis: Acute pancreatitis Diagnosis:  Principal Problem:   Acute pancreatitis Active Problems:   Hypertension goal BP (blood pressure) < 140/80   Essential hypertension   GERD (gastroesophageal reflux disease)   Chronic kidney disease, stage III (moderate)   Homelessness   DMII (diabetes mellitus, type 2)   Anemia of chronic disease   (HFpEF) heart failure with preserved ejection fraction   Polysubstance abuse   Total Time spent with patient: 45 minutes  Subjective:   Michael [name] is a 72 y.o. male patient admitted with  Chief Complaint  Patient presents with   Near Syncope     HPI:   Per Primary Team: Michael [name] is a 72 y.o. male with a history including but not limited to homelessness, cocaine abuse, paroxysmal atrial fibrillation on Cardizem, CKD stage IV, CAD status post CABG, insulin-dependent type 2 diabetes being admitted to the hospital with multiple complaints found to have possible acute pancreatitis.  Patient has had multiple recent ER visits with complaints of chest pain, including twice already this month.  He was discharged from the ER after evaluation completed likely musculoskeletal chest pain, also induced by cocaine use.  He was seen early this morning at the [facility], ER with the same complaints, and seen diagnosis.  Several hours later, he came to this emergency department complaining syncope, nausea, abdominal pain, chest pain.    Currently on interview, the patient. Alert and oriented x4.  He does report during casual conversation expressing some of his thoughts to staff members.  However patient does clarify  "I just have thoughts of not being alive.  I do not have thoughts of ending my life nor hurting myself.  I do want things to get better.  I want to  relocate out of [place], so that I can try to get myself together. "  Discussed with patient recent 2-week length of stay at inpatient psychiatric facility, in which she reports was beneficial " just did not think it was long enough" .  He is able to recall some coping skills to include thought blocking, positive affirmations, cessation of illicit substances.  However he expressed interest in returning to learn additional coping skills, he is encouraged to utilize the skills he has learned during his previous stay in the interim.  And being more active with staff as well as himself and identifying those thoughts.  Further discussion was held with patient that hospitalization is not a place for housing/shelter, and encouraged him to begin seeking long-term housing options to include [place] housing, transitional housing, treatment centers and or assisted living facility.  Over the past couple weeks he reports increase in anxiety, worsening depressive symptoms, to include tearfulness, guilty, hopeless, disturbed sleep pattern, and difficulty concentrating.  He does endorse decline in physical health as a result of his illicit substance use.   Patient denies any psychosis, hallucinations, or delusions at this time.. Patient denies PTSD symptoms including hypervigilance, flashbacks daily and nightmares every night.  He reports recent history of cocaine use, however further denies any withdrawal symptoms but endorses cravings on a daily basis.    On evaluation he reports his anxiety and depression relatively low, stating he just completed physical therapy and has noticed some improvement.  He denies any decreased ability to sleep, and states his appetite has been fairly normal since  being approved by speech therapy to eat a regular diet again. Patient does not appear acutely manic on exam and he does not elicit or endorse any current psychotic symptoms. Patient currently denies active suicidal ideations, suicide  thoughts, and or urges to self-harm.  He further denies any active or past homicidal ideations, and or auditory visual hallucinations.  He states his suicidal thoughts come and go, however adamantly denies any current thoughts at this time.    Past Psychiatric History: See above  Risk to Self:   Yes Risk to Others:   No Prior Inpatient Therapy:   Yes Prior Outpatient Therapy:   Yes  Past Medical History:  Past Medical History:  Diagnosis Date   A-fib    Anemia    Asthma    No PFTs, history of childhood asthma   CAD (coronary artery disease)    Cellulitis 04/2014   left facial   Cellulitis and abscess of toe of right foot 12/08/2019   Chondromalacia of medial femoral condyle    Left knee MRI 04/28/12: Chondromalacia of the medial femoral condyle with slight peripheral degeneration of the meniscocapsular junction of the medial meniscus; followed by sports medicine   Chronic kidney failure, stage 4 (severe)    Collagen vascular disease    Crack cocaine use    for 20+ years, has been enrolled in detox programs in the past   Depression    with history of hospitalization for suicidal ideation   Diabetes mellitus 2002   Diagnosed in 2002, started insulin in 2012   Gout    Headache(784.0)    CT head 08/2011: Periventricular and subcortical white matter hypodensities are most in keeping with chronic microangiopathic change   HIV infection 08/2011   Followed by Dr. Ninetta Lights   Hyperlipidemia    Hypertension    Pulmonary embolism     Past Surgical History:  Procedure Laterality Date   AMPUTATION Right 07/21/2019   Procedure: RIGHT SECOND TOE AMPUTATION;  Surgeon: Nadara Mustard, MD;  Location: [facility] OR;  Service: Orthopedics;  Laterality: Right;   BACK SURGERY     1988   BOWEL RESECTION     CARDIAC SURGERY     CERVICAL SPINE SURGERY     " rods in my neck "   CORONARY ARTERY BYPASS GRAFT     CORONARY STENT PLACEMENT     NM MYOCAR PERF WALL MOTION  12/27/2011   normal   SPINE SURGERY      Family Psychiatric  History:  Social History:  Social History   Substance and Sexual Activity  Alcohol Use No   Alcohol/week: 4.0 standard drinks of alcohol   Types: 2 Cans of beer, 2 Shots of liquor per week     Social History   Substance and Sexual Activity  Drug Use Not Currently   Frequency: 4.0 times per week   Types: "Crack" cocaine, Cocaine   Comment: Recent use of crack    Social History   Socioeconomic History   Marital status: Widowed    Spouse name: Not on file   Number of children: 2   Years of education: 68   Highest education level: 12th grade  Occupational History    Employer: UNEMPLOYED    Comment: 04/2016  Tobacco Use   Smoking status: Never   Smokeless tobacco: Never  Vaping Use   Vaping Use: Never used  Substance and Sexual Activity   Alcohol use: No    Alcohol/week: 4.0 standard drinks of alcohol  Types: 2 Cans of beer, 2 Shots of liquor per week   Drug use: Not Currently    Frequency: 4.0 times per week    Types: "Crack" cocaine, Cocaine    Comment: Recent use of crack   Sexual activity: Yes    Comment: DECLINED CONDOMS  Other Topics Concern   Not on file  Social History Narrative   Currently staying with a friend in [place].  Was staying @ local motel until a few days ago - left b/c of bed bugs.   Picked up from Extended [facility].  Not followed by a psychiatrist.   Social Determinants of Health   Financial Resource Strain: Medium Risk (09/16/2018)   Overall Financial Resource Strain (CARDIA)    Difficulty of Paying Living Expenses: Somewhat hard  Food Insecurity: Food Insecurity Present (01/17/2023)   Hunger Vital Sign    Worried About Running Out of Food in the Last Year: Often true    Ran Out of Food in the Last Year: Often true  Transportation Needs: Unmet Transportation Needs (01/17/2023)   PRAPARE - Administrator, Civil Service (Medical): Yes    Lack of Transportation (Non-Medical): Yes  Physical Activity: Unknown  (04/22/2019)   Exercise Vital Sign    Days of Exercise per Week: Not on file    Minutes of Exercise per Session: 0 min  Stress: Stress Concern Present (09/16/2018)   [employer] of Occupational Health - Occupational Stress Questionnaire    Feeling of Stress : Very much  Social Connections: Socially Isolated (09/16/2018)   Social Connection and Isolation Panel [NHANES]    Frequency of Communication with Friends and Family: Once a week    Frequency of Social Gatherings with Friends and Family: Never    Attends Religious Services: Never    Database administrator or Organizations: No    Attends Banker Meetings: Never    Marital Status: Widowed   Additional Social History:    Allergies:  No Known Allergies  Labs:  Results for orders placed or performed during the hospital encounter of 01/17/23 (from the past 48 hour(s))  Glucose, capillary     Status: Abnormal   Collection Time: 01/19/23  8:15 PM  Result Value Ref Range   Glucose-Capillary 113 (H) 70 - 99 mg/dL    Comment: Glucose reference range applies only to samples taken after fasting for at least 8 hours.  Glucose, capillary     Status: None   Collection Time: 01/20/23  8:00 AM  Result Value Ref Range   Glucose-Capillary 99 70 - 99 mg/dL    Comment: Glucose reference range applies only to samples taken after fasting for at least 8 hours.  Glucose, capillary     Status: Abnormal   Collection Time: 01/20/23 11:18 AM  Result Value Ref Range   Glucose-Capillary 216 (H) 70 - 99 mg/dL    Comment: Glucose reference range applies only to samples taken after fasting for at least 8 hours.  Glucose, capillary     Status: Abnormal   Collection Time: 01/20/23  4:51 PM  Result Value Ref Range   Glucose-Capillary 100 (H) 70 - 99 mg/dL    Comment: Glucose reference range applies only to samples taken after fasting for at least 8 hours.  Glucose, capillary     Status: Abnormal   Collection Time: 01/20/23  8:40 PM   Result Value Ref Range   Glucose-Capillary 202 (H) 70 - 99 mg/dL    Comment: Glucose reference  range applies only to samples taken after fasting for at least 8 hours.  Glucose, capillary     Status: Abnormal   Collection Time: 01/21/23  7:43 AM  Result Value Ref Range   Glucose-Capillary 114 (H) 70 - 99 mg/dL    Comment: Glucose reference range applies only to samples taken after fasting for at least 8 hours.   Comment 1 QC Due   Glucose, capillary     Status: Abnormal   Collection Time: 01/21/23 11:41 AM  Result Value Ref Range   Glucose-Capillary 105 (H) 70 - 99 mg/dL    Comment: Glucose reference range applies only to samples taken after fasting for at least 8 hours.  Glucose, capillary     Status: Abnormal   Collection Time: 01/21/23  4:27 PM  Result Value Ref Range   Glucose-Capillary 274 (H) 70 - 99 mg/dL    Comment: Glucose reference range applies only to samples taken after fasting for at least 8 hours.    Current Facility-Administered Medications  Medication Dose Route Frequency Provider Last Rate Last Admin   albuterol (PROVENTIL) (2.5 MG/3ML) 0.083% nebulizer solution 2.5 mg  2.5 mg Nebulization Q2H PRN Kirby Crigler, Mir M, MD       atorvastatin (LIPITOR) tablet 40 mg  40 mg Oral Daily Kirby Crigler, Mir M, MD   40 mg at 01/21/23 1478   diltiazem (CARDIZEM CD) 24 hr capsule 180 mg  180 mg Oral Daily [place], Eric J, DO   180 mg at 01/21/23 2956   docusate sodium (COLACE) capsule 100 mg  100 mg Oral BID Kirby Crigler, Mir M, MD   100 mg at 01/21/23 2130   enoxaparin (LOVENOX) injection 30 mg  30 mg Subcutaneous Daily Kirby Crigler, Mir M, MD   30 mg at 01/21/23 8657   ferrous sulfate tablet 325 mg  325 mg Oral Q breakfast Kirby Crigler, Mir M, MD   325 mg at 01/21/23 8469   FLUoxetine (PROZAC) capsule 20 mg  20 mg Oral Daily Kirby Crigler, Mir M, MD   20 mg at 01/21/23 0927   fluticasone furoate-vilanterol (BREO ELLIPTA) 200-25 MCG/ACT 1 puff  1 puff Inhalation Daily Olalere, Adewale A, MD    1 puff at 01/21/23 0928   hydrALAZINE (APRESOLINE) tablet 25 mg  25 mg Oral Q8H [place], Alvira Philips, DO   25 mg at 01/21/23 1503   insulin aspart (novoLOG) injection 0-5 Units  0-5 Units Subcutaneous QHS Kirby Crigler, Mir M, MD   2 Units at 01/20/23 2131   insulin aspart (novoLOG) injection 0-9 Units  0-9 Units Subcutaneous TID WC Maryln Gottron, MD   3 Units at 01/20/23 1246   isosorbide mononitrate (IMDUR) 24 hr tablet 15 mg  15 mg Oral Daily Kirby Crigler, Mir M, MD   15 mg at 01/21/23 6295   metoprolol tartrate (LOPRESSOR) injection 5 mg  5 mg Intravenous Q6H PRN Kirby Crigler, Mir M, MD       ondansetron [facility]) tablet 4 mg  4 mg Oral Q6H PRN Kirby Crigler, Mir M, MD       Or   ondansetron [facility]) injection 4 mg  4 mg Intravenous Q6H PRN Kirby Crigler, Mir M, MD       pantoprazole (PROTONIX) EC tablet 40 mg  40 mg Oral Daily Kirby Crigler, Mir M, MD   40 mg at 01/21/23 2841   QUEtiapine (SEROQUEL) tablet 50 mg  50 mg Oral QHS Kirby Crigler, Mir M, MD   50 mg at 01/20/23 2129   sodium bicarbonate tablet 650 mg  650 mg Oral TID Maryln Gottron, MD   650 mg at 01/21/23 0454   traMADol (ULTRAM) tablet 50 mg  50 mg Oral Q8H PRN Maryln Gottron, MD         Psychiatric Specialty Exam:  Presentation  General Appearance:  Appropriate for Environment  Eye Contact: Good  Speech: Clear and Coherent  Speech Volume: Normal  Handedness: Right   Mood and Affect  Mood: Depressed  Affect: Non-Congruent   Thought Process  Thought Processes: Coherent  Descriptions of Associations:Intact  Orientation:Full (Time, Place and Person)  Thought Content:Logical  History of Schizophrenia/Schizoaffective disorder:No  Duration of Psychotic Symptoms:No data recorded Hallucinations:No data recorded  Ideas of Reference:None  Suicidal Thoughts:No data recorded  Homicidal Thoughts:No data recorded  Sensorium  Memory: Immediate Good  Judgment: Poor  Insight: Poor   Executive Functions   Concentration: Good  Attention Span: Good  Recall: Good  Fund of Knowledge: Good  Language: Good   Psychomotor Activity  Psychomotor Activity:No data recorded  Assets  Assets: Desire for Improvement; Social Support; Housing   Sleep  Sleep:No data recorded   Physical Exam: Physical Exam Vitals and nursing note reviewed.  Constitutional:      Appearance: Normal appearance. He is normal weight.  Skin:    Capillary Refill: Capillary refill takes less than 2 seconds.  Neurological:     General: No focal deficit present.     Mental Status: He is alert and oriented to person, place, and time. Mental status is at baseline.    Review of Systems  Psychiatric/Behavioral:  Positive for depression, substance abuse and suicidal ideas. The patient is nervous/anxious and has insomnia.   All other systems reviewed and are negative.  Blood pressure 135/73, pulse 74, temperature 98 F (36.7 C), temperature source Oral, resp. rate 16, height  (1.727 m), weight 69.9 kg, SpO2 100 %. Body mass index is 23.42 kg/m.  Treatment Plan Summary: Depression Unspecified -Continue Prozac 20 mg PO daily -Continue Seroquel 50 mg PO QHS  Stimulant Use Disorder, Cocaine -Recommend residential rehab -Consider [facility] consult for list of transitional housing and treatment centers.  Disposition: Recommend psychiatric Inpatient admission when medically cleared. Psychiatry will continue to follow  Maryagnes Amos, FNP 01/21/2023 4:52 PM

## 2023-01-21 NOTE — TOC Progression Note (Signed)
Transition of Care [facility]) - Progression Note    Patient Details  Name: Michael [name] MRN: [id number] Date of Birth: Nov 29, 1950  Transition of Care [facility]) CM/SW Contact  Otelia Santee, LCSW Phone Number: 01/21/2023, 1:37 PM  Clinical Narrative:    No beds available at [facility] geri-psych. Faxed referral to [facility] for review.   TOC continuing to seek appropriate placement.    Expected Discharge Plan: Psychiatric Hospital Barriers to Discharge: No Barriers Identified  Expected Discharge Plan and Services In-house Referral: Clinical Social Work Discharge Planning Services: NA Post Acute Care Choice: NA Living arrangements for the past 2 months: Homeless Shelter                 DME Arranged: N/A DME Agency: NA                   Social Determinants of Health (SDOH) Interventions SDOH Screenings   Food Insecurity: Food Insecurity Present (01/17/2023)  Housing: High Risk (01/17/2023)  Transportation Needs: Unmet Transportation Needs (01/17/2023)  Utilities: Not At Risk (01/17/2023)  Recent Concern: Utilities - At Risk (10/22/2022)  Alcohol Screen: Low Risk  (11/27/2022)  Depression (PHQ2-9): High Risk (11/12/2021)  Financial Resource Strain: Medium Risk (09/16/2018)  Physical Activity: Unknown (04/22/2019)  Social Connections: Socially Isolated (09/16/2018)  Stress: Stress Concern Present (09/16/2018)  Tobacco Use: Low Risk  (01/17/2023)    Readmission Risk Interventions    07/05/2022   10:08 AM  Readmission Risk Prevention Plan  Transportation Screening Complete  Medication Review (RN Care Manager) Complete  PCP or Specialist appointment within 3-5 days of discharge Complete  HRI or Home Care Consult Complete  SW Recovery Care/Counseling Consult Complete  Palliative Care Screening Not Applicable  Skilled Nursing Facility Not Applicable

## 2023-01-22 DIAGNOSIS — K859 Acute pancreatitis without necrosis or infection, unspecified: Secondary | ICD-10-CM | POA: Diagnosis not present

## 2023-01-22 LAB — BASIC METABOLIC PANEL
Anion gap: 8 (ref 5–15)
BUN: 52 mg/dL — ABNORMAL HIGH (ref 8–23)
CO2: 20 mmol/L — ABNORMAL LOW (ref 22–32)
Calcium: 8.1 mg/dL — ABNORMAL LOW (ref 8.9–10.3)
Chloride: 115 mmol/L — ABNORMAL HIGH (ref 98–111)
Creatinine, Ser: 3.25 mg/dL — ABNORMAL HIGH (ref 0.61–1.24)
GFR, Estimated: 19 mL/min — ABNORMAL LOW (ref >60)
Glucose, Bld: 103 mg/dL — ABNORMAL HIGH (ref 70–99)
Potassium: 5 mmol/L (ref 3.5–5.1)
Sodium: 143 mmol/L (ref 135–145)

## 2023-01-22 LAB — CBC
HCT: 24.4 % — ABNORMAL LOW (ref 39.0–52.0)
Hemoglobin: 7.6 g/dL — ABNORMAL LOW (ref 13.0–17.0)
MCH: 28 pg (ref 26.0–34.0)
MCHC: 31.1 g/dL (ref 30.0–36.0)
MCV: 90 fL (ref 80.0–100.0)
Platelets: 126 10*3/uL — ABNORMAL LOW (ref 150–400)
RBC: 2.71 MIL/uL — ABNORMAL LOW (ref 4.22–5.81)
RDW: 13.4 % (ref 11.5–15.5)
WBC: 3.4 10*3/uL — ABNORMAL LOW (ref 4.0–10.5)
nRBC: 0 % (ref 0.0–0.2)

## 2023-01-22 LAB — GLUCOSE, CAPILLARY
Glucose-Capillary: 97 mg/dL (ref 70–99)
Glucose-Capillary: 138 mg/dL — ABNORMAL HIGH (ref 70–99)
Glucose-Capillary: 138 mg/dL — ABNORMAL HIGH (ref 70–99)
Glucose-Capillary: 157 mg/dL — ABNORMAL HIGH (ref 70–99)

## 2023-01-22 LAB — MAGNESIUM: Magnesium: 1.5 mg/dL — ABNORMAL LOW (ref 1.7–2.4)

## 2023-01-22 NOTE — Progress Notes (Signed)
Progress Note   Patient: Michael [name] ZOX:[id number] DOB: [birth year] DOA: 01/17/2023     0 DOS: the patient was seen and examined on 01/22/2023   Brief hospital course: 72 y.o. male with past medical history significant for paroxysmal atrial fibrillation, CKD stage IV, CAD s/p CABG, type 2 diabetes mellitus currently diet controlled, continue cocaine abuse, homelessness who presented to [facility] ED on 4/12 complaining of passing out spells, nausea/vomiting, abdominal pain, chest pain.  With multiple ED visits with complaints of chest pain, twice this month and earlier same day of admission at [facility], ED and was discharged home.  He represented to [name], ED complaining of syncope, nausea, abdominal pain and chest pain.  Patient reports that his chest pain is localized to his left chest, nonradiating with no known alleviating or exacerbating factors.  Patient denied headache, no visual changes, no shortness of breath, no fever/chills/night sweats, diarrhea.    In the ED, temperature 98.2 F, HR 89, RR 17, BP 165/96, SpO2 98% on room air.  WBC 5.9, hemoglobin 9.0, platelets 123.  Sodium 140, potassium 4.4, chloride 112, CO2 20, glucose 153, BUN 70, creatinine 3.58, alkaline phosphatase 63, AST 16, ALT 17, total bilirubin 0.4.  Lipase 261.  High sensitive troponin that was done earlier in the day at [facility] 21.  TSH 1.949.  EtOH level less than 10.  UDS positive for cocaine.  EKG personally reviewed, normal sinus rhythm with rate 84, T wave inversions noted lead I, aVL which are similar in appearance to previous EKGs, no concerning dynamic changes.  CT abdomen/pelvis with no acute abdominal pathology, including no stranding surrounding the pancreas.  Patient was given 1 L NS bolus, Zofran.  Hospitalist consulted for further evaluation management of syncopal versus presyncopal events in the setting of continued cocaine abuse, questional pancreatitis with elevated  lipase.  Assessment and Plan: Questionable pancreatitis Patient reports nausea/vomiting.  Lipase was noted to be elevated at 261.  Although CT abdomen/pelvis with no acute abdominal pathology including no stranding surrounding the pancreas.  Patient was started on IV fluids, liquid diet and was slowly advanced with toleration.  Triglycerides 54.  EtOH level less than 10.  Unclear etiology or if actual pancreatitis. -- Tramadol 50 mg p.o. q8h PRN, mild pain, no further escalation of pain medications given substance abuse -- Continue supportive care, antiemetics as needed, illicit drug avoidance -without complaints today, denies abd pain, tolerating diet   Syncope versus presyncope Patient reports several episodes (# 10) of "passing out" with no prodrome.  Denies biting his tongue or loss of bowel/bladder function.  Patient is afebrile without leukocytosis.  Electrolytes within normal limits.  Continues to be positive for cocaine on UDS.  Recent TTE 12/12/2022 with LVEF 55 to 60%, normal LV diastolic parameters, LV with regional wall motion abnormalities and mild hypokinesis no mitral valve regurgitation or stenosis, no aortic stenosis, no significant change noted from prior studies.  Suspect etiology from dehydration in the setting of poor oral intake in the days preceding hospitalization with continued complication of cocaine use disorder. -- No further imaging/testing required at this time -- Continue to monitor on telemetry   Suicidal ideation Patient continues to endorse ideations for suicide with no specific plan.  Requesting inpatient psych treatment, reports he has been at [facility] inpatient psych previously. -- Psychiatry following, appreciate assistance -- Continue suicide precautions, one-to-one sitter -- Psychiatry recommending inpatient psych placement; TOC consulted, pending   Remains medically stable for discharge  to inpatient psych once bed available   Paroxysmal atrial  fibrillation Essential hypertension CAD s/p CABG Patient with history of paroxysmal atrial fibrillation, currently in normal sinus rhythm.  Not on anticoagulation outpatient likely secondary to high risk given his substance abuse. -- Cardizem increased to 180 mg p.o. daily -- Imdur 15 mg p.o. daily -- Continue hydralazine 25 mg p.o. every 8 hours -- Continue monitor on telemetry   CAD s/p CABG -- Atorvastatin 40 mg p.o. daily   CKD stage IV Baseline creatinine 3.4-3.5 over the last year.  Creatinine admission 3.29, at baseline. --Sodium bicarb and 650 mg p.o. 3 times daily -- Avoid nephrotoxins, renal cell medications -Cr stable   Anxiety/depression -- Fluoxetine 20 mg p.o. daily -- Seroquel 50 mg p.o. nightly   Anemia of chronic medical disease/renal disease Anemia panel with iron 32, TIBC low at 193, ferritin 293, folate 13.9, vitamin B12 452. -- Hgb 9.0>7.6; likely dilutional from IV fluid resuscitation on admission. -- Continue ferrous sulfate 325 mg p.o. daily   Type 2 diabetes mellitus Diet controlled at baseline.  Last hemoglobin A1c 6.8 on 09/30/2022, well-controlled. -- SSI for coverage -- CBGs qAC/HS   GERD -- Protonix 40 mg p.o. daily   Cocaine use disorder UDS positive for cocaine on admission.  Chronic issue.  TOC consulted for substance abuse resources.         Subjective: Without complaints this AM  Physical Exam: Vitals:   01/21/23 1333 01/21/23 2051 01/22/23 0751 01/22/23 1257  BP: 135/73 (!) 140/83  (!) 192/94  Pulse: 74 72  72  Resp: Temp: 98 F (36.7 C) 98.2 F (36.8 C)  98 F (36.7 C)  TempSrc: Oral Oral  Oral  SpO2: 100% 100% 99% 100%  Weight:      Height:       General exam: Conversant, in no acute distress Respiratory system: normal chest rise, clear, no audible wheezing Cardiovascular system: regular rhythm, s1-s2 Gastrointestinal system: Nondistended, nontender, pos BS Central nervous system: No seizures, no  tremors Extremities: No cyanosis, no joint deformities Skin: No rashes, no pallor Psychiatry: Affect normal // mood seems normal  Data Reviewed:  Labs reviewed: Na 143, K 5.0, Cr 3.25, Hgb 7.6  Family Communication: Pt in room, famiy not at bedside  Disposition: Status is: Observation The patient remains OBS appropriate and will d/c before 2 midnights.  Planned Discharge Destination:  Inpatient Psych    Author: Rickey Barbara, MD 01/22/2023 5:48 PM  For on call review [email or web address].

## 2023-01-22 NOTE — Plan of Care (Signed)

## 2023-01-22 NOTE — Progress Notes (Signed)
Mobility Specialist - Progress Note   01/22/23 1230  Mobility  Activity Ambulated with assistance in hallway;Ambulated with assistance to bathroom  Level of Assistance Standby assist, set-up cues, supervision of patient - no hands on  Assistive Device Front wheel walker  Distance Ambulated (ft) 120 ft  Activity Response Tolerated well  Mobility Referral Yes  $Mobility charge 1 Mobility   Pt received in bed and agreeable to mobility. Pt was MinA from sit>stand. No complaints during session. Upon returning to room pt requested to use the bathroom for a BM. Sitter made aware. Pt to bathroom after session with all needs met.    [facility]

## 2023-01-22 NOTE — Hospital Course (Signed)
72 y.o. male with past medical history significant for paroxysmal atrial fibrillation, CKD stage IV, CAD s/p CABG, type 2 diabetes mellitus currently diet controlled, continue cocaine abuse, homelessness who presented to [facility] ED on 4/12 complaining of passing out spells, nausea/vomiting, abdominal pain, chest pain.  With multiple ED visits with complaints of chest pain, twice this month and earlier same day of admission at [facility], ED and was discharged home.  He represented to [name], ED complaining of syncope, nausea, abdominal pain and chest pain.  Patient reports that his chest pain is localized to his left chest, nonradiating with no known alleviating or exacerbating factors.  Patient denied headache, no visual changes, no shortness of breath, no fever/chills/night sweats, diarrhea.    In the ED, temperature 98.2 F, HR 89, RR 17, BP 165/96, SpO2 98% on room air.  WBC 5.9, hemoglobin 9.0, platelets 123.  Sodium 140, potassium 4.4, chloride 112, CO2 20, glucose 153, BUN 70, creatinine 3.58, alkaline phosphatase 63, AST 16, ALT 17, total bilirubin 0.4.  Lipase 261.  High sensitive troponin that was done earlier in the day at [facility] 21.  TSH 1.949.  EtOH level less than 10.  UDS positive for cocaine.  EKG personally reviewed, normal sinus rhythm with rate 84, T wave inversions noted lead I, aVL which are similar in appearance to previous EKGs, no concerning dynamic changes.  CT abdomen/pelvis with no acute abdominal pathology, including no stranding surrounding the pancreas.  Patient was given 1 L NS bolus, Zofran.  Hospitalist consulted for further evaluation management of syncopal versus presyncopal events in the setting of continued cocaine abuse, questional pancreatitis with elevated lipase.

## 2023-01-22 NOTE — Progress Notes (Signed)
Mobility Specialist - Progress Note   01/22/23 1602  Mobility  Activity Ambulated with assistance in hallway  Level of Assistance Standby assist, set-up cues, supervision of patient - no hands on  Assistive Device Front wheel walker  Distance Ambulated (ft) 120 ft  Activity Response Tolerated well  Mobility Referral Yes  $Mobility charge 1 Mobility   Pt received in bed and agreeable to mobility. No complaints during session. Pt to EOB for meal after session with all needs met. Sitter in room.   [name]

## 2023-01-22 NOTE — TOC Progression Note (Addendum)
Transition of Care [facility]) - Progression Note    Patient Details  Name: Michael [name] MRN: [id number] Date of Birth: 03-17-1951  [facility] CM/SW Contact  Otelia Santee, LCSW Phone Number: 01/22/2023, 10:21 AM  Clinical Narrative:    Currently no geri-psych beds available at [facility].  [facility]- have not reviewed referral as they have no geri-psych beds available.  [facility]- Left 2nd voicemail.  [facility]- Re-faxed referral. Currently have one geri-psych bed available.   Addendum: [facility] declined for medical reasons.   Addendum: CSW provided pt with list of residential substance use programs and [facility] and encouraged pt to call for placement.    Expected Discharge Plan: [facility] Barriers to Discharge: No Barriers Identified  Expected Discharge Plan and Services In-house Referral: Clinical Social Work Discharge Planning Services: NA Post Acute Care Choice: NA Living arrangements for the past 2 months: Homeless Shelter                 DME Arranged: N/A DME Agency: NA                   Social Determinants of Health (SDOH) Interventions SDOH Screenings   Food Insecurity: Food Insecurity Present (01/17/2023)  Housing: High Risk (01/17/2023)  Transportation Needs: Unmet Transportation Needs (01/17/2023)  Utilities: Not At Risk (01/17/2023)  Recent Concern: Utilities - At Risk (10/22/2022)  Alcohol Screen: Low Risk  (11/27/2022)  Depression (PHQ2-9): High Risk (11/12/2021)  Financial Resource Strain: Medium Risk (09/16/2018)  Physical Activity: Unknown (04/22/2019)  Social Connections: Socially Isolated (09/16/2018)  Stress: Stress Concern Present (09/16/2018)  Tobacco Use: Low Risk  (01/17/2023)    Readmission Risk Interventions    07/05/2022   10:08 AM  Readmission Risk Prevention Plan  Transportation Screening Complete  Medication Review (RN Care Manager) Complete  PCP or Specialist appointment within  3-5 days of discharge Complete  HRI or Home Care Consult Complete  SW Recovery Care/Counseling Consult Complete  Palliative Care Screening Not Applicable  Skilled Nursing Facility Not Applicable

## 2023-01-22 NOTE — Progress Notes (Signed)
Throughout the day, patient was very calm and cooperative. He smiled and interacted with me today, he answered all my questions appropriately. Patient walked with mobility technicians twice today, and was verbally engaging with the mobility technicians.

## 2023-01-23 DIAGNOSIS — K859 Acute pancreatitis without necrosis or infection, unspecified: Secondary | ICD-10-CM | POA: Diagnosis not present

## 2023-01-23 LAB — CBC
HCT: 23.7 % — ABNORMAL LOW (ref 39.0–52.0)
Hemoglobin: 7.5 g/dL — ABNORMAL LOW (ref 13.0–17.0)
MCH: 28.2 pg (ref 26.0–34.0)
MCHC: 31.6 g/dL (ref 30.0–36.0)
MCV: 89.1 fL (ref 80.0–100.0)
Platelets: 135 10*3/uL — ABNORMAL LOW (ref 150–400)
RBC: 2.66 MIL/uL — ABNORMAL LOW (ref 4.22–5.81)
RDW: 13.3 % (ref 11.5–15.5)
WBC: 2.9 10*3/uL — ABNORMAL LOW (ref 4.0–10.5)
nRBC: 0 % (ref 0.0–0.2)

## 2023-01-23 LAB — COMPREHENSIVE METABOLIC PANEL
ALT: 12 U/L (ref 0–44)
AST: 11 U/L — ABNORMAL LOW (ref 15–41)
Albumin: 2.8 g/dL — ABNORMAL LOW (ref 3.5–5.0)
Alkaline Phosphatase: 43 U/L (ref 38–126)
Anion gap: 7 (ref 5–15)
BUN: 52 mg/dL — ABNORMAL HIGH (ref 8–23)
CO2: 23 mmol/L (ref 22–32)
Calcium: 8.1 mg/dL — ABNORMAL LOW (ref 8.9–10.3)
Chloride: 109 mmol/L (ref 98–111)
Creatinine, Ser: 3.23 mg/dL — ABNORMAL HIGH (ref 0.61–1.24)
GFR, Estimated: 20 mL/min — ABNORMAL LOW (ref >60)
Glucose, Bld: 96 mg/dL (ref 70–99)
Potassium: 5 mmol/L (ref 3.5–5.1)
Sodium: 139 mmol/L (ref 135–145)
Total Bilirubin: 0.4 mg/dL (ref 0.3–1.2)
Total Protein: 5.9 g/dL — ABNORMAL LOW (ref 6.5–8.1)

## 2023-01-23 LAB — GLUCOSE, CAPILLARY
Glucose-Capillary: 110 mg/dL — ABNORMAL HIGH (ref 70–99)
Glucose-Capillary: 129 mg/dL — ABNORMAL HIGH (ref 70–99)
Glucose-Capillary: 127 mg/dL — ABNORMAL HIGH (ref 70–99)
Glucose-Capillary: 210 mg/dL — ABNORMAL HIGH (ref 70–99)

## 2023-01-23 NOTE — Progress Notes (Signed)
Mobility Specialist - Progress Note   01/23/23 1517  Mobility  Activity Ambulated with assistance in hallway  Level of Assistance Standby assist, set-up cues, supervision of patient - no hands on  Assistive Device Front wheel walker  Distance Ambulated (ft) 80 ft  Activity Response Tolerated well  Mobility Referral Yes  $Mobility charge 1 Mobility   Pt received in bed and agreeable to mobility. No complaints during session. Pt to EOB after session with all needs met.    [facility]

## 2023-01-23 NOTE — Progress Notes (Signed)
Progress Note   Patient: Michael [name] VWU:[id number] DOB: 1951-07-01 DOA: 01/17/2023     0 DOS: the patient was seen and examined on 01/23/2023   Brief hospital course: 72 y.o. male with past medical history significant for paroxysmal atrial fibrillation, CKD stage IV, CAD s/p CABG, type 2 diabetes mellitus currently diet controlled, continue cocaine abuse, homelessness who presented to [facility] ED on 4/12 complaining of passing out spells, nausea/vomiting, abdominal pain, chest pain.  With multiple ED visits with complaints of chest pain, twice this month and earlier same day of admission at [facility], ED and was discharged home.  He represented to [name], ED complaining of syncope, nausea, abdominal pain and chest pain.  Patient reports that his chest pain is localized to his left chest, nonradiating with no known alleviating or exacerbating factors.  Patient denied headache, no visual changes, no shortness of breath, no fever/chills/night sweats, diarrhea.    In the ED, temperature 98.2 F, HR 89, RR 17, BP 165/96, SpO2 98% on room air.  WBC 5.9, hemoglobin 9.0, platelets 123.  Sodium 140, potassium 4.4, chloride 112, CO2 20, glucose 153, BUN 70, creatinine 3.58, alkaline phosphatase 63, AST 16, ALT 17, total bilirubin 0.4.  Lipase 261.  High sensitive troponin that was done earlier in the day at [facility] 21.  TSH 1.949.  EtOH level less than 10.  UDS positive for cocaine.  EKG personally reviewed, normal sinus rhythm with rate 84, T wave inversions noted lead I, aVL which are similar in appearance to previous EKGs, no concerning dynamic changes.  CT abdomen/pelvis with no acute abdominal pathology, including no stranding surrounding the pancreas.  Patient was given 1 L NS bolus, Zofran.  Hospitalist consulted for further evaluation management of syncopal versus presyncopal events in the setting of continued cocaine abuse, questional pancreatitis with elevated  lipase.  Assessment and Plan: Questionable pancreatitis Patient reports nausea/vomiting.  Lipase was noted to be elevated at 261.  Although CT abdomen/pelvis with no acute abdominal pathology including no stranding surrounding the pancreas.  Patient was started on IV fluids, liquid diet and was slowly advanced with toleration.  Triglycerides 54.  EtOH level less than 10.  Unclear etiology or if actual pancreatitis. -- Tramadol 50 mg p.o. q8h PRN, mild pain, no further escalation of pain medications given substance abuse -- Continue supportive care, antiemetics as needed, illicit drug avoidance -reports mild nausea over the past day or so, tolerating diet however. Declines antiemetic at this time   Syncope versus presyncope Patient reports several episodes (# 10) of "passing out" with no prodrome.  Denies biting his tongue or loss of bowel/bladder function.  Patient is afebrile without leukocytosis.  Electrolytes within normal limits.  Continues to be positive for cocaine on UDS.  Recent TTE 12/12/2022 with LVEF 55 to 60%, normal LV diastolic parameters, LV with regional wall motion abnormalities and mild hypokinesis no mitral valve regurgitation or stenosis, no aortic stenosis, no significant change noted from prior studies.  Suspect etiology from dehydration in the setting of poor oral intake in the days preceding hospitalization with continued complication of cocaine use disorder. -- No further imaging/testing required at this time -- Continue to monitor on telemetry   Suicidal ideation Patient continues to endorse ideations for suicide with no specific plan.  Requesting inpatient psych treatment, reports he has been at [facility] inpatient psych previously. -- Psychiatry following, appreciate assistance -- Continue suicide precautions, one-to-one sitter -- Psychiatry recommending inpatient psych placement; TOC  consulted, pending   Remains medically stable for discharge to inpatient psych once bed  available   Paroxysmal atrial fibrillation Essential hypertension CAD s/p CABG Patient with history of paroxysmal atrial fibrillation, currently in normal sinus rhythm.  Not on anticoagulation outpatient likely secondary to high risk given his substance abuse. -- Cardizem increased to 180 mg p.o. daily -- Imdur 15 mg p.o. daily -- Continue hydralazine 25 mg p.o. every 8 hours -- Continue monitor on telemetry   CAD s/p CABG -- Atorvastatin 40 mg p.o. daily   CKD stage IV Baseline creatinine 3.4-3.5 over the last year.  Creatinine admission 3.29, at baseline. --Sodium bicarb and 650 mg p.o. 3 times daily -- Avoid nephrotoxins, renal cell medications -Cr stable   Anxiety/depression -- Fluoxetine 20 mg p.o. daily -- Seroquel 50 mg p.o. nightly   Anemia of chronic medical disease/renal disease Anemia panel with iron 32, TIBC low at 193, ferritin 293, folate 13.9, vitamin B12 452. --retic count w/in normal limits -- Continue ferrous sulfate 325 mg p.o. daily   Type 2 diabetes mellitus Diet controlled at baseline.  Last hemoglobin A1c 6.8 on 09/30/2022, well-controlled. -- SSI for coverage -- CBGs qAC/HS   GERD -- Protonix 40 mg p.o. daily   Cocaine use disorder UDS positive for cocaine on admission.  Chronic issue.  TOC consulted for substance abuse resources.         Subjective: No complaints this AM  Physical Exam: Vitals:   01/23/23 0435 01/23/23 0815 01/23/23 0944 01/23/23 1331  BP: (!) 165/79  (!) 143/70 138/68  Pulse: 62  84   Resp: Temp: 98.5 F (36.9 C)  97.9 F (36.6 C) 97.9 F (36.6 C)  TempSrc: Oral     SpO2: 100% 98% 100%   Weight:      Height:       General exam: Awake, laying in bed, in nad Respiratory system: Normal respiratory effort, no wheezing Cardiovascular system: regular rate, s1, s2 Gastrointestinal system: Soft, nondistended, positive BS Central nervous system: CN2-12 grossly intact, strength intact Extremities: Perfused,  no clubbing Skin: Normal skin turgor, no notable skin lesions seen Psychiatry: Mood normal // no visual hallucinations   Data Reviewed:  Labs reviewed: Na 139, K 5.0, Cr 3.23, Hgb 7.5  Family Communication: Pt in room, famiy not at bedside  Disposition: Status is: Observation The patient remains OBS appropriate and will d/c before 2 midnights.  Planned Discharge Destination:  Inpatient Psych    Author: Rickey Barbara, MD 01/23/2023 2:30 PM  For on call review [email or web address].

## 2023-01-23 NOTE — Progress Notes (Signed)
Mobility Specialist - Progress Note   01/23/23 1210  Mobility  Activity Ambulated with assistance in hallway  Level of Assistance Standby assist, set-up cues, supervision of patient - no hands on  Assistive Device Front wheel walker  Distance Ambulated (ft) 80 ft  Activity Response Tolerated well  Mobility Referral Yes  $Mobility charge 1 Mobility   Pt received in bed and agreeable to mobility. No complaints during session. Pt to EOB after session with all needs met. Sitter in room.   [facility]

## 2023-01-24 DIAGNOSIS — Z5900 Homelessness unspecified: Secondary | ICD-10-CM | POA: Diagnosis not present

## 2023-01-24 DIAGNOSIS — K859 Acute pancreatitis without necrosis or infection, unspecified: Secondary | ICD-10-CM | POA: Diagnosis not present

## 2023-01-24 DIAGNOSIS — F1914 Other psychoactive substance abuse with psychoactive substance-induced mood disorder: Secondary | ICD-10-CM | POA: Diagnosis not present

## 2023-01-24 LAB — CBC
HCT: 24.4 % — ABNORMAL LOW (ref 39.0–52.0)
Hemoglobin: 7.6 g/dL — ABNORMAL LOW (ref 13.0–17.0)
MCH: 28 pg (ref 26.0–34.0)
MCHC: 31.1 g/dL (ref 30.0–36.0)
MCV: 90 fL (ref 80.0–100.0)
Platelets: 143 10*3/uL — ABNORMAL LOW (ref 150–400)
RBC: 2.71 MIL/uL — ABNORMAL LOW (ref 4.22–5.81)
RDW: 13.2 % (ref 11.5–15.5)
WBC: 3.5 10*3/uL — ABNORMAL LOW (ref 4.0–10.5)
nRBC: 0 % (ref 0.0–0.2)

## 2023-01-24 LAB — GLUCOSE, CAPILLARY
Glucose-Capillary: 78 mg/dL (ref 70–99)
Glucose-Capillary: 128 mg/dL — ABNORMAL HIGH (ref 70–99)
Glucose-Capillary: 150 mg/dL — ABNORMAL HIGH (ref 70–99)
Glucose-Capillary: 213 mg/dL — ABNORMAL HIGH (ref 70–99)

## 2023-01-24 LAB — COMPREHENSIVE METABOLIC PANEL
ALT: 14 U/L (ref 0–44)
AST: 15 U/L (ref 15–41)
Albumin: 3 g/dL — ABNORMAL LOW (ref 3.5–5.0)
Alkaline Phosphatase: 46 U/L (ref 38–126)
Anion gap: 5 (ref 5–15)
BUN: 50 mg/dL — ABNORMAL HIGH (ref 8–23)
CO2: 24 mmol/L (ref 22–32)
Calcium: 8.1 mg/dL — ABNORMAL LOW (ref 8.9–10.3)
Chloride: 108 mmol/L (ref 98–111)
Creatinine, Ser: 3.59 mg/dL — ABNORMAL HIGH (ref 0.61–1.24)
GFR, Estimated: 17 mL/min — ABNORMAL LOW (ref >60)
Glucose, Bld: 91 mg/dL (ref 70–99)
Potassium: 5.2 mmol/L — ABNORMAL HIGH (ref 3.5–5.1)
Sodium: 137 mmol/L (ref 135–145)
Total Bilirubin: 0.6 mg/dL (ref 0.3–1.2)
Total Protein: 6.3 g/dL — ABNORMAL LOW (ref 6.5–8.1)

## 2023-01-24 MED ORDER — SODIUM ZIRCONIUM CYCLOSILICATE 5 G PO PACK
5.0000 g | PACK | Freq: Two times a day (BID) | ORAL | Status: AC
  Administered 2023-01-24 (×2): 5 g via ORAL
  Filled 2023-01-24 (×2): qty 1

## 2023-01-24 NOTE — Progress Notes (Signed)
Progress Note   Patient: Michael [name] [id number] DOB: 11-18-1950 DOA: 01/17/2023     0 DOS: the patient was seen and examined on 01/24/2023   Brief hospital course: 72 y.o. male with past medical history significant for paroxysmal atrial fibrillation, CKD stage IV, CAD s/p CABG, type 2 diabetes mellitus currently diet controlled, continue cocaine abuse, homelessness who presented to [facility] ED on 4/12 complaining of passing out spells, nausea/vomiting, abdominal pain, chest pain.  With multiple ED visits with complaints of chest pain, twice this month and earlier same day of admission at [facility], ED and was discharged home.  He represented to [name], ED complaining of syncope, nausea, abdominal pain and chest pain.  Patient reports that his chest pain is localized to his left chest, nonradiating with no known alleviating or exacerbating factors.  Patient denied headache, no visual changes, no shortness of breath, no fever/chills/night sweats, diarrhea.    In the ED, temperature 98.2 F, HR 89, RR 17, BP 165/96, SpO2 98% on room air.  WBC 5.9, hemoglobin 9.0, platelets 123.  Sodium 140, potassium 4.4, chloride 112, CO2 20, glucose 153, BUN 70, creatinine 3.58, alkaline phosphatase 63, AST 16, ALT 17, total bilirubin 0.4.  Lipase 261.  High sensitive troponin that was done earlier in the day at [facility] 21.  TSH 1.949.  EtOH level less than 10.  UDS positive for cocaine.  EKG personally reviewed, normal sinus rhythm with rate 84, T wave inversions noted lead I, aVL which are similar in appearance to previous EKGs, no concerning dynamic changes.  CT abdomen/pelvis with no acute abdominal pathology, including no stranding surrounding the pancreas.  Patient was given 1 L NS bolus, Zofran.  Hospitalist consulted for further evaluation management of syncopal versus presyncopal events in the setting of continued cocaine abuse, questional pancreatitis with elevated  lipase.  Assessment and Plan: Questionable pancreatitis Patient reports nausea/vomiting.  Lipase was noted to be elevated at 261.  Although CT abdomen/pelvis with no acute abdominal pathology including no stranding surrounding the pancreas.  Patient was started on IV fluids, liquid diet and was slowly advanced with toleration.  Triglycerides 54.  EtOH level less than 10.  Unclear etiology or if actual pancreatitis. -- Tramadol 50 mg p.o. q8h PRN, mild pain, no further escalation of pain medications given substance abuse -- Continue supportive care, antiemetics as needed, illicit drug avoidance -reports mild nausea over the past day or so, tolerating diet however. Declines antiemetic at this time   Syncope versus presyncope Patient reports several episodes (# 10) of "passing out" with no prodrome.  Denies biting his tongue or loss of bowel/bladder function.  Patient is afebrile without leukocytosis.  Electrolytes within normal limits.  Continues to be positive for cocaine on UDS.  Recent TTE 12/12/2022 with LVEF 55 to 60%, normal LV diastolic parameters, LV with regional wall motion abnormalities and mild hypokinesis no mitral valve regurgitation or stenosis, no aortic stenosis, no significant change noted from prior studies.  Suspect etiology from dehydration in the setting of poor oral intake in the days preceding hospitalization with continued complication of cocaine use disorder. -- No further imaging/testing required at this time -- Continue to monitor on telemetry   Suicidal ideation Patient earlier endorsed ideations for suicide with no specific plan.  Requesting inpatient psych treatment, reports he has been at [facility] inpatient psych previously. -- Psychiatry had been following, appreciate assistance -- Had been continuing suicide precautions, one-to-one sitter -- Had been awaiting  inpt psych placement - on 4/19, discussed with Psychiatry who re-evaluated pt. Pt now deemed no longer suicidal  and no longer requiring inpt psych   Paroxysmal atrial fibrillation Essential hypertension CAD s/p CABG Patient with history of paroxysmal atrial fibrillation, currently in normal sinus rhythm.  Not on anticoagulation outpatient likely secondary to high risk given his substance abuse. -- Cardizem increased to 180 mg p.o. daily -- Imdur 15 mg p.o. daily -- Continue hydralazine 25 mg p.o. every 8 hours -- Continue monitor on telemetry   CAD s/p CABG -- Atorvastatin 40 mg p.o. daily   CKD stage IV Baseline creatinine 3.4-3.5 over the last year.  Creatinine admission 3.29, at baseline. --Sodium bicarb and 650 mg p.o. 3 times daily -- Avoid nephrotoxins, renal cell medications -Cr stable   Anxiety/depression -- Fluoxetine 20 mg p.o. daily -- Seroquel 50 mg p.o. nightly   Anemia of chronic medical disease/renal disease Anemia panel with iron 32, TIBC low at 193, ferritin 293, folate 13.9, vitamin B12 452. --retic count w/in normal limits -- Continue ferrous sulfate 325 mg p.o. daily   Type 2 diabetes mellitus Diet controlled at baseline.  Last hemoglobin A1c 6.8 on 09/30/2022, well-controlled. -- SSI for coverage -- CBGs qAC/HS   GERD -- Protonix 40 mg p.o. daily   Cocaine use disorder UDS positive for cocaine on admission.  Chronic issue.  TOC consulted for substance abuse resources.       Subjective: Without complaints this AM. Reports feeling well  Physical Exam: Vitals:   01/23/23 2153 01/24/23 0623 01/24/23 0814 01/24/23 1303  BP: (!) 140/80 (!) 161/84  135/77  Pulse: 69 64  73  Resp: Temp: 98.8 F (37.1 C) 98.2 F (36.8 C)  98.1 F (36.7 C)  TempSrc: Oral Oral  Oral  SpO2: 100% 100% 99% 100%  Weight:      Height:       General exam: Conversant, in no acute distress Respiratory system: normal chest rise, clear, no audible wheezing Cardiovascular system: regular rhythm, s1-s2 Gastrointestinal system: Nondistended, nontender, pos BS Central  nervous system: No seizures, no tremors Extremities: No cyanosis, no joint deformities Skin: No rashes, no pallor Psychiatry: Affect normal // no auditory hallucinations   Data Reviewed:  Labs reviewed: Na Na 137, K 5.2, Cr 3.59, Hgb 7.6  Family Communication: Pt in room, famiy not at bedside  Disposition: Status is: Observation The patient remains OBS appropriate and will d/c before 2 midnights.  Planned Discharge Destination:  [facility] and residential programs    Author: Rickey Barbara, MD 01/24/2023 3:41 PM  For on call review [email or web address].

## 2023-01-24 NOTE — Consult Note (Signed)
[facility] Face-to-Face Psychiatry Consult   Reason for Consult:  Suicidal Ideation  Referring Physician:  Dr. Uzbekistan Patient Identification: Michael [name] MRN:  [id number] Principal Diagnosis: Acute pancreatitis Diagnosis:  Principal Problem:   Acute pancreatitis Active Problems:   Hypertension goal BP (blood pressure) < 140/80   Essential hypertension   GERD (gastroesophageal reflux disease)   Chronic kidney disease, stage III (moderate)   Homelessness   DMII (diabetes mellitus, type 2)   Anemia of chronic disease   (HFpEF) heart failure with preserved ejection fraction   Polysubstance abuse   Total Time spent with patient: 45 minutes  Subjective:   Michael [name] is a 72 y.o. male patient admitted with  Chief Complaint  Patient presents with   Near Syncope    Subjective:  On face-to face assessment, patient was observed lying in bed sleeping, but awaken when writer touched him. He is alert and oriented x4, was pleasant and engaging during interview. He stated that his mood was fair, affect was congruent. His speech was normal, rate and volume. He endorses that his suicidal ideation "comes and goes", but denies any plan. He denies auditory nor visual hallucination, but stated that he experienced visual hallucination 2 days ago when he "saw a tall guy walking across the floor saying nothing". He denies delusion and paranoia. He has not called transitional places such as [facility], stating that he doesn't have a phone in his room. He verbalizes being grateful for the supportive team, stating that he will continue to use the coping mechanisms he learnt.  Michael [name] is a 72 y.o. male suicidal thoughts that are chronic in nature, no plan.  His suicidality did improve with admission, which suggests patient's symptoms are consistent with substance-induced mood disorder.  Patient is very well-known to behavioral health service line, as he is noted to have 14 emergency room  visits this year, with 4 inpatient hospitalizations (1 psychiatric hospitalization).  Majority of his visits were consistent with chest pain, cocaine abuse, polysubstance abuse, pancreatitis, and renal injuries.  While patient does have risk factors for suicide including 1 previous attempt, limited coping, hopelessness, homelessness, and noncompliant with medication.  It does seem that he is attempting to get better, very familiar with emergency services, has no problems seeking help when needed, and endorsing wanting to remain sober. However, it is possible that he is amplifying his symptoms symptoms in order to obtain secondary gain and I do favor malingering for shelter as the most likely diagnosis in this case.  This seems to be supported by patient's numerous emergency room visits, recent hospitalization (12-day length of stay) in which he seemed to have benefited little from, and was dissatisfied with the length of stay despite being acute inpatient hospitalization.  Patient did not engage in any attempts to find suitable housing or a safe discharge planning despite being given multiple resources and encourage to call facilities. Given repeated hospitalizations (including to [facility] in March) with little benefit, I do not believe that psychiatric admission will significantly mitigate his risk, which is strongly related to limited access of resources including housing and worsening medical conditions due to noncompliance/adherence to treatment.. He would benefit most from connection with these resources. Hospitalization may actually discourage him from utilizing outpatient resources. I have discussed these considerations with Michael [name] , who voiced an understanding despite still desiring an admission strong suspicion for secondary gain (housing).     Michael [name] is a 72 year old African  American male who was admitted to [facility] for chest pain, SOB, and suicidal ideations.  He has  multiple chronic comorbidities to include CHF, COPD, polysubstance use, and HIV.  While in the hospital he was followed by psychiatric consult service, in which his psychotropic medications were resumed to include quetiapine 50 mg p.o. nightly, fluoxetine 20 mg p.o. daily.  His mood did improve over time, as evidenced by patient's increased interaction with staff, appreciative for care and services received, denial of active suicidality, ability to contract for safety, and motivation to remain sober and use coping skills at length.  His sleep and appetite remained stable.  He does endorse ongoing homelessness, which makes it difficult for him to remain compliant with medication and take care of self.  He has been working with social work to find him accommodations to include [facility], treatment facility.  Patient has been provided with a list of substance abuse resources and inpatient rehabilitation.  Throughout his hospital stay he was pleasant, cooperative, engaged with multidisciplinary team in all levels.  At this time it is felt that he has maximized hospitalization services from psychiatric consult service, and is now at baseline to discharge to inpatient rehabilitation.  As noted above patient with multiple medical comorbidities makes it difficult to obtain placement due to need for higher level of care, consider ALF or group home.   HPI:   Per Primary Team: Michael [name] is a 72 y.o. male with a history including but not limited to homelessness, cocaine abuse, paroxysmal atrial fibrillation on Cardizem, CKD stage IV, CAD status post CABG, insulin-dependent type 2 diabetes being admitted to the hospital with multiple complaints found to have possible acute pancreatitis.  Patient has had multiple recent ER visits with complaints of chest pain, including twice already this month.  He was discharged from the ER after evaluation completed likely musculoskeletal chest pain, also induced by cocaine  use.  He was seen early this morning at the [facility], ER with the same complaints, and seen diagnosis.  Several hours later, he came to this emergency department complaining syncope, nausea, abdominal pain, chest pain.      Past Psychiatric History: See above  Risk to Self:   Yes Risk to Others:   No Prior Inpatient Therapy:   Yes Prior Outpatient Therapy:   Yes  Past Medical History:  Past Medical History:  Diagnosis Date   A-fib    Anemia    Asthma    No PFTs, history of childhood asthma   CAD (coronary artery disease)    Cellulitis 04/2014   left facial   Cellulitis and abscess of toe of right foot 12/08/2019   Chondromalacia of medial femoral condyle    Left knee MRI 04/28/12: Chondromalacia of the medial femoral condyle with slight peripheral degeneration of the meniscocapsular junction of the medial meniscus; followed by sports medicine   Chronic kidney failure, stage 4 (severe)    Collagen vascular disease    Crack cocaine use    for 20+ years, has been enrolled in detox programs in the past   Depression    with history of hospitalization for suicidal ideation   Diabetes mellitus 2002   Diagnosed in 2002, started insulin in 2012   Gout    Headache(784.0)    CT head 08/2011: Periventricular and subcortical white matter hypodensities are most in keeping with chronic microangiopathic change   HIV infection 08/2011   Followed by Dr. Ninetta Lights   Hyperlipidemia  Hypertension    Pulmonary embolism     Past Surgical History:  Procedure Laterality Date   AMPUTATION Right 07/21/2019   Procedure: RIGHT SECOND TOE AMPUTATION;  Surgeon: Nadara Mustard, MD;  Location: [facility] OR;  Service: Orthopedics;  Laterality: Right;   BACK SURGERY     1988   BOWEL RESECTION     CARDIAC SURGERY     CERVICAL SPINE SURGERY     " rods in my neck "   CORONARY ARTERY BYPASS GRAFT     CORONARY STENT PLACEMENT     NM MYOCAR PERF WALL MOTION  12/27/2011   normal   SPINE SURGERY     Family  Psychiatric  History:  Social History:  Social History   Substance and Sexual Activity  Alcohol Use No   Alcohol/week: 4.0 standard drinks of alcohol   Types: 2 Cans of beer, 2 Shots of liquor per week     Social History   Substance and Sexual Activity  Drug Use Not Currently   Frequency: 4.0 times per week   Types: "Crack" cocaine, Cocaine   Comment: Recent use of crack    Social History   Socioeconomic History   Marital status: Widowed    Spouse name: Not on file   Number of children: 2   Years of education: 48   Highest education level: 12th grade  Occupational History    Employer: UNEMPLOYED    Comment: 04/2016  Tobacco Use   Smoking status: Never   Smokeless tobacco: Never  Vaping Use   Vaping Use: Never used  Substance and Sexual Activity   Alcohol use: No    Alcohol/week: 4.0 standard drinks of alcohol    Types: 2 Cans of beer, 2 Shots of liquor per week   Drug use: Not Currently    Frequency: 4.0 times per week    Types: "Crack" cocaine, Cocaine    Comment: Recent use of crack   Sexual activity: Yes    Comment: DECLINED CONDOMS  Other Topics Concern   Not on file  Social History Narrative   Currently staying with a friend in [place].  Was staying @ local motel until a few days ago - left b/c of bed bugs.   P[facility].  Not followed by a psychiatrist.   Social Determinants of Health   Financial Resource Strain: Medium Risk (09/16/2018)   Overall Financial Resource Strain (CARDIA)    Difficulty of Paying Living Expenses: Somewhat hard  Food Insecurity: Food Insecurity Present (01/17/2023)   Hunger Vital Sign    Worried About Running Out of Food in the Last Year: Often true    Ran Out of Food in the Last Year: Often true  Transportation Needs: Unmet Transportation Needs (01/17/2023)   PRAPARE - Administrator, Civil Service (Medical): Yes    Lack of Transportation (Non-Medical): Yes  Physical Activity: Unknown (04/22/2019)    Exercise Vital Sign    Days of Exercise per Week: Not on file    Minutes of Exercise per Session: 0 min  Stress: Stress Concern Present (09/16/2018)   [employer] of Occupational Health - Occupational Stress Questionnaire    Feeling of Stress : Very much  Social Connections: Socially Isolated (09/16/2018)   Social Connection and Isolation Panel [NHANES]    Frequency of Communication with Friends and Family: Once a week    Frequency of Social Gatherings with Friends and Family: Never    Attends Religious Services: Never  Active Member of Clubs or Organizations: No    Attends Banker Meetings: Never    Marital Status: Widowed   Additional Social History:    Allergies:  No Known Allergies  Labs:  Results for orders placed or performed during the hospital encounter of 01/17/23 (from the past 48 hour(s))  Glucose, capillary     Status: Abnormal   Collection Time: 01/22/23 11:46 AM  Result Value Ref Range   Glucose-Capillary 138 (H) 70 - 99 mg/dL    Comment: Glucose reference range applies only to samples taken after fasting for at least 8 hours.  Glucose, capillary     Status: Abnormal   Collection Time: 01/22/23  4:29 PM  Result Value Ref Range   Glucose-Capillary 138 (H) 70 - 99 mg/dL    Comment: Glucose reference range applies only to samples taken after fasting for at least 8 hours.  Glucose, capillary     Status: Abnormal   Collection Time: 01/22/23  8:53 PM  Result Value Ref Range   Glucose-Capillary 157 (H) 70 - 99 mg/dL    Comment: Glucose reference range applies only to samples taken after fasting for at least 8 hours.  Comprehensive metabolic panel     Status: Abnormal   Collection Time: 01/23/23  5:55 AM  Result Value Ref Range   Sodium 139 135 - 145 mmol/L   Potassium 5.0 3.5 - 5.1 mmol/L   Chloride 109 98 - 111 mmol/L   CO2 23 22 - 32 mmol/L   Glucose, Bld 96 70 - 99 mg/dL    Comment: Glucose reference range applies only to samples taken after  fasting for at least 8 hours.   BUN 52 (H) 8 - 23 mg/dL   Creatinine, Ser 6.29 (H) 0.61 - 1.24 mg/dL   Calcium 8.1 (L) 8.9 - 10.3 mg/dL   Total Protein 5.9 (L) 6.5 - 8.1 g/dL   Albumin 2.8 (L) 3.5 - 5.0 g/dL   AST 11 (L) 15 - 41 U/L   ALT 12 0 - 44 U/L   Alkaline Phosphatase 43 38 - 126 U/L   Total Bilirubin 0.4 0.3 - 1.2 mg/dL   GFR, Estimated 20 (L) >60 mL/min    Comment: (NOTE) Calculated using the CKD-EPI Creatinine Equation (2021)    Anion gap 7 5 - 15    Comment: Performed at [facility], [address] [place], [place] [address]  CBC     Status: Abnormal   Collection Time: 01/23/23  5:55 AM  Result Value Ref Range   WBC 2.9 (L) 4.0 - 10.5 K/uL   RBC 2.66 (L) 4.22 - 5.81 MIL/uL   Hemoglobin 7.5 (L) 13.0 - 17.0 g/dL   HCT 32.4 (L) 40.1 - 02.7 %   MCV 89.1 80.0 - 100.0 fL   MCH 28.2 26.0 - 34.0 pg   MCHC 31.6 30.0 - 36.0 g/dL   RDW 25.3 66.4 - 40.3 %   Platelets 135 (L) 150 - 400 K/uL   nRBC 0.0 0.0 - 0.2 %    Comment: Performed at [facility], [address] [place], [place] [address]  Glucose, capillary     Status: Abnormal   Collection Time: 01/23/23  7:28 AM  Result Value Ref Range   Glucose-Capillary 110 (H) 70 - 99 mg/dL    Comment: Glucose reference range applies only to samples taken after fasting for at least 8 hours.  Glucose, capillary     Status: Abnormal  Collection Time: 01/23/23 11:39 AM  Result Value Ref Range   Glucose-Capillary 129 (H) 70 - 99 mg/dL    Comment: Glucose reference range applies only to samples taken after fasting for at least 8 hours.  Glucose, capillary     Status: Abnormal   Collection Time: 01/23/23  4:52 PM  Result Value Ref Range   Glucose-Capillary 127 (H) 70 - 99 mg/dL    Comment: Glucose reference range applies only to samples taken after fasting for at least 8 hours.  Glucose, capillary     Status: Abnormal   Collection Time: 01/23/23  9:50 PM  Result Value Ref Range    Glucose-Capillary 210 (H) 70 - 99 mg/dL    Comment: Glucose reference range applies only to samples taken after fasting for at least 8 hours.  Comprehensive metabolic panel     Status: Abnormal   Collection Time: 01/24/23  5:20 AM  Result Value Ref Range   Sodium 137 135 - 145 mmol/L   Potassium 5.2 (H) 3.5 - 5.1 mmol/L   Chloride 108 98 - 111 mmol/L   CO2 24 22 - 32 mmol/L   Glucose, Bld 91 70 - 99 mg/dL    Comment: Glucose reference range applies only to samples taken after fasting for at least 8 hours.   BUN 50 (H) 8 - 23 mg/dL   Creatinine, Ser 8.65 (H) 0.61 - 1.24 mg/dL   Calcium 8.1 (L) 8.9 - 10.3 mg/dL   Total Protein 6.3 (L) 6.5 - 8.1 g/dL   Albumin 3.0 (L) 3.5 - 5.0 g/dL   AST 15 15 - 41 U/L   ALT 14 0 - 44 U/L   Alkaline Phosphatase 46 38 - 126 U/L   Total Bilirubin 0.6 0.3 - 1.2 mg/dL   GFR, Estimated 17 (L) >60 mL/min    Comment: (NOTE) Calculated using the CKD-EPI Creatinine Equation (2021)    Anion gap 5 5 - 15    Comment: Performed at [facility], [address], [place], [place] [address]  CBC     Status: Abnormal   Collection Time: 01/24/23  5:20 AM  Result Value Ref Range   WBC 3.5 (L) 4.0 - 10.5 K/uL   RBC 2.71 (L) 4.22 - 5.81 MIL/uL   Hemoglobin 7.6 (L) 13.0 - 17.0 g/dL   HCT 62.9 (L) 52.8 - 41.3 %   MCV 90.0 80.0 - 100.0 fL   MCH 28.0 26.0 - 34.0 pg   MCHC 31.1 30.0 - 36.0 g/dL   RDW 24.4 01.0 - 27.2 %   Platelets 143 (L) 150 - 400 K/uL   nRBC 0.0 0.0 - 0.2 %    Comment: Performed at [facility], [address] [place], [place] [address]  Glucose, capillary     Status: None   Collection Time: 01/24/23  7:14 AM  Result Value Ref Range   Glucose-Capillary 78 70 - 99 mg/dL    Comment: Glucose reference range applies only to samples taken after fasting for at least 8 hours.    Current Facility-Administered Medications  Medication Dose Route Frequency Provider Last Rate Last Admin   albuterol (PROVENTIL) (2.5  MG/3ML) 0.083% nebulizer solution 2.5 mg  2.5 mg Nebulization Q2H PRN Kirby Crigler, Mir M, MD       atorvastatin (LIPITOR) tablet 40 mg  40 mg Oral Daily Kirby Crigler, Mir M, MD   40 mg at 01/23/23 1039   diltiazem (CARDIZEM CD) 24 hr capsule 180 mg  180 mg  Oral Daily Uzbekistan, Eric J, DO   180 mg at 01/23/23 1045   docusate sodium (COLACE) capsule 100 mg  100 mg Oral BID Kirby Crigler, Mir M, MD   100 mg at 01/23/23 2116   enoxaparin (LOVENOX) injection 30 mg  30 mg Subcutaneous Daily Kirby Crigler, Mir M, MD   30 mg at 01/23/23 1039   ferrous sulfate tablet 325 mg  325 mg Oral Q breakfast Kirby Crigler, Mir M, MD   325 mg at 01/24/23 0830   FLUoxetine (PROZAC) capsule 20 mg  20 mg Oral Daily Kirby Crigler, Mir M, MD   20 mg at 01/23/23 1040   fluticasone furoate-vilanterol (BREO ELLIPTA) 200-25 MCG/ACT 1 puff  1 puff Inhalation Daily Olalere, Adewale A, MD   1 puff at 01/24/23 0814   hydrALAZINE (APRESOLINE) tablet 25 mg  25 mg Oral Q8H [place], Alvira Philips, DO   25 mg at 01/24/23 0636   insulin aspart (novoLOG) injection 0-5 Units  0-5 Units Subcutaneous QHS Kirby Crigler, Mir M, MD   2 Units at 01/23/23 2259   insulin aspart (novoLOG) injection 0-9 Units  0-9 Units Subcutaneous TID WC Kirby Crigler Mir M, MD   1 Units at 01/23/23 1803   isosorbide mononitrate (IMDUR) 24 hr tablet 15 mg  15 mg Oral Daily Kirby Crigler, Mir M, MD   15 mg at 01/23/23 1039   metoprolol tartrate (LOPRESSOR) injection 5 mg  5 mg Intravenous Q6H PRN Kirby Crigler, Mir M, MD       ondansetron [facility]) tablet 4 mg  4 mg Oral Q6H PRN Kirby Crigler, Mir M, MD       Or   ondansetron [facility]) injection 4 mg  4 mg Intravenous Q6H PRN Kirby Crigler, Mir M, MD   4 mg at 01/22/23 1311   pantoprazole (PROTONIX) EC tablet 40 mg  40 mg Oral Daily Kirby Crigler, Mir M, MD   40 mg at 01/23/23 1039   QUEtiapine (SEROQUEL) tablet 50 mg  50 mg Oral QHS Kirby Crigler, Mir M, MD   50 mg at 01/23/23 2116   sodium bicarbonate tablet 650 mg  650 mg Oral TID Maryln Gottron, MD   650  mg at 01/23/23 2116   sodium zirconium cyclosilicate (LOKELMA) packet 5 g  5 g Oral BID Jerald Kief, MD       traMADol Janean Sark) tablet 50 mg  50 mg Oral Q8H PRN Kirby Crigler, Mir M, MD   50 mg at 01/23/23 1240     Psychiatric Specialty Exam:  Presentation  General Appearance:  Appropriate for Environment; Casual  Eye Contact: Good  Speech: Clear and Coherent; Normal Rate  Speech Volume: Normal  Handedness: Right   Mood and Affect  Mood: Depressed (smiling at times)  Affect: Congruent; Appropriate   Thought Process  Thought Processes: Coherent; Linear  Descriptions of Associations:Intact  Orientation:Full (Time, Place and Person)  Thought Content:Logical  History of Schizophrenia/Schizoaffective disorder:No  Duration of Psychotic Symptoms:No data recorded Hallucinations:Hallucinations: Other (comment) (Not today)   Ideas of Reference:None  Suicidal Thoughts:Suicidal Thoughts: Yes, Passive SI Passive Intent and/or Plan: Without Intent; Without Plan; Without Means to Carry Out; Without Access to Means   Homicidal Thoughts:Homicidal Thoughts: No   Sensorium  Memory: Immediate Fair; Recent Good; Remote Fair  Judgment: Fair  Insight: Fair   Art therapist  Concentration: Fair  Attention Span: Good  Recall: Good  Fund of Knowledge: Good  Language: Good   Psychomotor Activity  Psychomotor Activity:Psychomotor Activity: Normal   Assets  Assets: Communication Skills; Resilience; Social Support  Sleep  Sleep:Sleep: Fair Number of Hours of Sleep: 7    Physical Exam: Physical Exam Vitals and nursing note reviewed.  Constitutional:      Appearance: Normal appearance. He is normal weight.  Skin:    Capillary Refill: Capillary refill takes less than 2 seconds.  Neurological:     General: No focal deficit present.     Mental Status: He is alert and oriented to person, place, and time. Mental status is at baseline.   Psychiatric:        Mood and Affect: Mood normal.        Behavior: Behavior normal.        Thought Content: Thought content normal.        Judgment: Judgment normal.    Review of Systems  Psychiatric/Behavioral:  Positive for depression, substance abuse and suicidal ideas. The patient is nervous/anxious and has insomnia.   All other systems reviewed and are negative.  Blood pressure (!) 161/84, pulse 64, temperature 98.2 F (36.8 C), temperature source Oral, resp. rate 18, height 5\' 8"  (1.727 m), weight 69.9 kg, SpO2 99 %. Body mass index is 23.42 kg/m.   Patient continues to endorse chronic SI (and noted in chart review) but is currently denying SI and contracting for safety. We recommend discontinuing 1:1 given his chronic SI in the setting of unstable housing, chronic medical conditions. His depression appears to be stable at this time and would continue his prozac at current dose. He is interested in inpatient rehab for his substance use and we would prefer for him to have a door-to-door to inpatient substance use rehab from this hospitalization.  Patient with brighter affect today, less somnolent compared to prior.   Plan discharge patient at this time. All questions, comments and concerns have been addressed. Patient does have a history of reported (1) suicide attempt. Patient with multiple inpatient admissions and ER visits for his psych history and inability to remain free of illicit substances that contribute to worsening medical conditions.  Patient has been seen several times by psychiatric consult service and behavioral health providers as we attempted to address concerns about patient's ongoing substance use, chronic suicidality, and need to establish outpatient services.   Patient does continue to endorse history of chronic, fleeting suicidal ideations at this time.  He does not appear to be at imminent risk of danger to self or danger to others at this time.  He is able to  contract for safety, has made no attempts to hurt self while in the hospital.  At this time will discontinue suicide sitter, and change disposition to inpatient rehab or outpatient substance abuse facility.  As noted patient will be difficult to place due to medical conditions/comorbidities.  May be a more suitable candidate for substance abuse partial hospitalization program, adult living facility, and or group home.  Treatment Plan Summary: Depression Unspecified -Continue Prozac 20 mg PO daily -Continue Seroquel 50 mg PO QHS  Stimulant Use Disorder, Cocaine -Recommend residential rehab -Consider [facility] consult for list of transitional housing and treatment centers.   Psychiatric consult service to sign off at this time. Disposition: No evidence of imminent risk to self or others at present.   Patient does not meet criteria for psychiatric inpatient admission. Supportive therapy provided about ongoing stressors. Refer to IOP. Discussed crisis plan, support from social network, calling 911, coming to the Emergency Department, and calling Suicide Hotline.  Maryagnes Amos, FNP 01/24/2023 10:06 AM

## 2023-01-25 DIAGNOSIS — K859 Acute pancreatitis without necrosis or infection, unspecified: Secondary | ICD-10-CM | POA: Diagnosis not present

## 2023-01-25 LAB — COMPREHENSIVE METABOLIC PANEL
ALT: 13 U/L (ref 0–44)
AST: 12 U/L — ABNORMAL LOW (ref 15–41)
Albumin: 3.1 g/dL — ABNORMAL LOW (ref 3.5–5.0)
Alkaline Phosphatase: 48 U/L (ref 38–126)
Anion gap: 7 (ref 5–15)
BUN: 57 mg/dL — ABNORMAL HIGH (ref 8–23)
CO2: 23 mmol/L (ref 22–32)
Calcium: 8.4 mg/dL — ABNORMAL LOW (ref 8.9–10.3)
Chloride: 111 mmol/L (ref 98–111)
Creatinine, Ser: 3.5 mg/dL — ABNORMAL HIGH (ref 0.61–1.24)
GFR, Estimated: 18 mL/min — ABNORMAL LOW (ref >60)
Glucose, Bld: 92 mg/dL (ref 70–99)
Potassium: 5.3 mmol/L — ABNORMAL HIGH (ref 3.5–5.1)
Sodium: 141 mmol/L (ref 135–145)
Total Bilirubin: 0.4 mg/dL (ref 0.3–1.2)
Total Protein: 6.4 g/dL — ABNORMAL LOW (ref 6.5–8.1)

## 2023-01-25 LAB — GLUCOSE, CAPILLARY
Glucose-Capillary: 93 mg/dL (ref 70–99)
Glucose-Capillary: 141 mg/dL — ABNORMAL HIGH (ref 70–99)

## 2023-01-25 LAB — CBC
HCT: 25.6 % — ABNORMAL LOW (ref 39.0–52.0)
Hemoglobin: 8 g/dL — ABNORMAL LOW (ref 13.0–17.0)
MCH: 28.2 pg (ref 26.0–34.0)
MCHC: 31.3 g/dL (ref 30.0–36.0)
MCV: 90.1 fL (ref 80.0–100.0)
Platelets: 149 10*3/uL — ABNORMAL LOW (ref 150–400)
RBC: 2.84 MIL/uL — ABNORMAL LOW (ref 4.22–5.81)
RDW: 13.1 % (ref 11.5–15.5)
WBC: 4 10*3/uL (ref 4.0–10.5)
nRBC: 0 % (ref 0.0–0.2)

## 2023-01-25 MED ORDER — HYDRALAZINE HCL 25 MG PO TABS: 25.0000 mg | ORAL_TABLET | Freq: Three times a day (TID) | ORAL | 0 refills | Status: DC

## 2023-01-25 MED ORDER — SODIUM ZIRCONIUM CYCLOSILICATE 10 G PO PACK: 10.0000 g | PACK | Freq: Two times a day (BID) | ORAL | Status: DC

## 2023-01-25 MED ORDER — DILTIAZEM HCL ER COATED BEADS 180 MG PO CP24: 180.0000 mg | ORAL_CAPSULE | Freq: Every day | ORAL | 0 refills | Status: DC

## 2023-01-25 MED ORDER — SODIUM ZIRCONIUM CYCLOSILICATE 10 G PO PACK
10.0000 g | PACK | Freq: Every day | ORAL | Status: DC
  Filled 2023-01-25: qty 1

## 2023-01-25 NOTE — Progress Notes (Signed)
Discharge instructions reviewed with pt, no IV to remove.  Pt taken via wheelchair to discharge area.

## 2023-01-25 NOTE — Discharge Summary (Signed)
Physician Discharge Summary   Patient: Michael [name] MRN: [id number] DOB: August 20, 1951  Admit date:     01/17/2023  Discharge date: 01/25/23  Discharge Physician: Rickey Barbara   PCP: Patient, No Pcp Per   Recommendations at discharge:    Follow up with PCP In 1-2 weeks Recommend recheck cbc and bmet in 1 week, to follow up with PCP  Discharge Diagnoses: Principal Problem:   Acute pancreatitis Active Problems:   Essential hypertension   (HFpEF) heart failure with preserved ejection fraction   DMII (diabetes mellitus, type 2)   Polysubstance abuse   Hypertension goal BP (blood pressure) < 140/80   GERD (gastroesophageal reflux disease)   Chronic kidney disease, stage III (moderate)   Homelessness   Anemia of chronic disease  Resolved Problems:   * No resolved hospital problems. *  Hospital Course: 72 y.o. male with past medical history significant for paroxysmal atrial fibrillation, CKD stage IV, CAD s/p CABG, type 2 diabetes mellitus currently diet controlled, continue cocaine abuse, homelessness who presented to [facility] ED on 4/12 complaining of passing out spells, nausea/vomiting, abdominal pain, chest pain.  With multiple ED visits with complaints of chest pain, twice this month and earlier same day of admission at [facility], ED and was discharged home.  He represented to [name], ED complaining of syncope, nausea, abdominal pain and chest pain.  Patient reports that his chest pain is localized to his left chest, nonradiating with no known alleviating or exacerbating factors.  Patient denied headache, no visual changes, no shortness of breath, no fever/chills/night sweats, diarrhea.    In the ED, temperature 98.2 F, HR 89, RR 17, BP 165/96, SpO2 98% on room air.  WBC 5.9, hemoglobin 9.0, platelets 123.  Sodium 140, potassium 4.4, chloride 112, CO2 20, glucose 153, BUN 70, creatinine 3.58, alkaline phosphatase 63, AST 16, ALT 17, total bilirubin 0.4.  Lipase  261.  High sensitive troponin that was done earlier in the day at [facility] 21.  TSH 1.949.  EtOH level less than 10.  UDS positive for cocaine.  EKG personally reviewed, normal sinus rhythm with rate 84, T wave inversions noted lead I, aVL which are similar in appearance to previous EKGs, no concerning dynamic changes.  CT abdomen/pelvis with no acute abdominal pathology, including no stranding surrounding the pancreas.  Patient was given 1 L NS bolus, Zofran.  Hospitalist consulted for further evaluation management of syncopal versus presyncopal events in the setting of continued cocaine abuse, questional pancreatitis with elevated lipase.  Assessment and Plan: Questionable pancreatitis Patient reports nausea/vomiting.  Lipase was noted to be elevated at 261.  Although CT abdomen/pelvis with no acute abdominal pathology including no stranding surrounding the pancreas.  Patient was started on IV fluids, liquid diet and was slowly advanced with toleration.  Triglycerides 54.  EtOH level less than 10.  Unclear etiology or if actual pancreatitis. -- Tramadol 50 mg p.o. q8h PRN, mild pain, no further escalation of pain medications given substance abuse -- Continue supportive care, antiemetics as needed, illicit drug avoidance -reported some mild nausea over the past day or so, however pt had been tolerating diet   Syncope versus presyncope Patient reports several episodes (# 10) of "passing out" with no prodrome.  Denies biting his tongue or loss of bowel/bladder function.  Patient is afebrile without leukocytosis.  Electrolytes within normal limits.  Continues to be positive for cocaine on UDS.  Recent TTE 12/12/2022 with LVEF 55 to 60%, normal  LV diastolic parameters, LV with regional wall motion abnormalities and mild hypokinesis no mitral valve regurgitation or stenosis, no aortic stenosis, no significant change noted from prior studies.  Suspect etiology from dehydration in the setting of poor  oral intake in the days preceding hospitalization with continued complication of cocaine use disorder. -- No further imaging/testing required at this time   Suicidal ideation resolved Patient earlier endorsed ideations for suicide with no specific plan.  Requesting inpatient psych treatment, reports he has been at [facility] inpatient psych previously. -- Psychiatry had been following, appreciate assistance -- Had been continuing suicide precautions, one-to-one sitter -- Had been awaiting inpt psych placement - on 4/19, discussed with Psychiatry who re-evaluated pt. Pt now deemed no longer suicidal and no longer requiring inpt psych. See psychiatry note   Paroxysmal atrial fibrillation Essential hypertension CAD s/p CABG Patient with history of paroxysmal atrial fibrillation, currently in normal sinus rhythm.  Not on anticoagulation outpatient likely secondary to high risk given his substance abuse. -- Cardizem increased to 180 mg p.o. daily -- Imdur 15 mg p.o. daily -- Continue hydralazine 25 mg p.o. every 8 hours   CAD s/p CABG -- Atorvastatin 40 mg p.o. daily   CKD stage IV Baseline creatinine 3.4-3.5 over the last year.  Creatinine admission 3.29, at baseline. --Sodium bicarb and 650 mg p.o. 3 times daily -- Avoid nephrotoxins, renal cell medications -Cr stable   Anxiety/depression -- Fluoxetine 20 mg p.o. daily -- Seroquel 50 mg p.o. nightly   Anemia of chronic medical disease/renal disease Anemia panel with iron 32, TIBC low at 193, ferritin 293, folate 13.9, vitamin B12 452. --retic count w/in normal limits -- Continue ferrous sulfate 325 mg p.o. daily   Type 2 diabetes mellitus Diet controlled at baseline.  Last hemoglobin A1c 6.8 on 09/30/2022, well-controlled. -- SSI for coverage -- CBGs qAC/HS   GERD -- Protonix 40 mg p.o. daily   Cocaine use disorder UDS positive for cocaine on admission.  Chronic issue.  TOC consulted for substance abuse  resources.  Hyperkalemia -very mild -Given several doses of lokelma this visit -Pt reportedly refused final dose of lokelma prior to leaving hospital at d/c    Consultants: Psychiatry Procedures performed:   Disposition: Group home Diet recommendation:  Regular diet DISCHARGE MEDICATION: Allergies as of 01/25/2023   No Known Allergies      Medication List     TAKE these medications    albuterol 108 (90 Base) MCG/ACT inhaler Commonly known as: VENTOLIN HFA Inhale 1-2 puffs into the lungs every 6 (six) hours as needed for wheezing or shortness of breath.   aspirin 81 MG chewable tablet Chew by mouth daily.   atorvastatin 40 MG tablet Commonly known as: LIPITOR Take 1 tablet (40 mg total) by mouth daily.   b complex-vitamin c-folic acid 0.8 MG Tabs tablet Take 1 tablet by mouth daily.   Breo Ellipta 200-25 MCG/ACT Aepb Generic drug: fluticasone furoate-vilanterol Inhale 1 puff into the lungs daily.   diltiazem 180 MG 24 hr capsule Commonly known as: CARDIZEM CD Take 1 capsule (180 mg total) by mouth daily. Start taking on: January 26, 2023 What changed:  medication strength how much to take   ferrous sulfate 325 (65 FE) MG tablet Take 1 tablet (325 mg total) by mouth daily with breakfast.   FLUoxetine 20 MG capsule Commonly known as: PROZAC Take 1 capsule (20 mg total) by mouth daily.   folic acid 1 MG tablet Commonly known as: FOLVITE Take 1  tablet (1 mg total) by mouth daily.   hydrALAZINE 25 MG tablet Commonly known as: APRESOLINE Take 1 tablet (25 mg total) by mouth every 8 (eight) hours.   isosorbide mononitrate 30 MG 24 hr tablet Commonly known as: IMDUR Take 0.5 tablets (15 mg total) by mouth daily.   nitroGLYCERIN 0.4 MG SL tablet Commonly known as: NITROSTAT Place 1 tablet (0.4 mg total) under the tongue every 5 (five) minutes as needed for chest pain.   pantoprazole 40 MG tablet Commonly known as: PROTONIX Take 1 tablet (40 mg total) by  mouth daily.   polyethylene glycol 17 g packet Commonly known as: MIRALAX / GLYCOLAX Take 17 g by mouth 2 (two) times daily.   QUEtiapine 50 MG tablet Commonly known as: SEROQUEL Take 1 tablet (50 mg total) by mouth at bedtime.   sodium bicarbonate 650 MG tablet Take 1 tablet (650 mg total) by mouth 3 (three) times daily.        Follow-up Information     Follow up with your PCP 1-2 weeks Follow up.   Why: Hospital follow up               Discharge Exam: Filed Weights   01/17/23 0722  Weight: 69.9 kg   General exam: Awake, laying in bed, in nad Respiratory system: Normal respiratory effort, no wheezing Cardiovascular system: regular rate, s1, s2 Gastrointestinal system: Soft, nondistended, positive BS Central nervous system: CN2-12 grossly intact, strength intact Extremities: Perfused, no clubbing Skin: Normal skin turgor, no notable skin lesions seen Psychiatry: Mood normal // no visual hallucinations   Condition at discharge: fair  The results of significant diagnostics from this hospitalization (including imaging, microbiology, ancillary and laboratory) are listed below for reference.   Imaging Studies: CT ABDOMEN PELVIS WO CONTRAST  Result Date: 01/17/2023 CLINICAL DATA:  Abdominal pain EXAM: CT ABDOMEN AND PELVIS WITHOUT CONTRAST TECHNIQUE: Multidetector CT imaging of the abdomen and pelvis was performed following the standard protocol without IV contrast. RADIATION DOSE REDUCTION: This exam was performed according to the departmental dose-optimization program which includes automated exposure control, adjustment of the mA and/or kV according to patient size and/or use of iterative reconstruction technique. COMPARISON:  01/09/2023 FINDINGS: Lower chest: Dependent subsegmental atelectasis or scarring medial right base. No pericardial or pleural effusion. Hepatobiliary: No focal liver abnormality is seen. No gallstones, gallbladder wall thickening, or biliary  dilatation. Pancreas: Unremarkable. No pancreatic ductal dilatation or surrounding inflammatory changes. Spleen: Normal in size without focal abnormality. Adrenals/Urinary Tract: Adrenal glands are unremarkable. Kidneys are normal, without renal calculi, focal lesion, or hydronephrosis. Bladder is unremarkable. Stomach/Bowel: Stomach is within normal limits. Appendix appears normal. No evidence of bowel wall thickening, distention, or inflammatory changes. Vascular/Lymphatic: Aortic atherosclerosis. No enlarged abdominal or pelvic lymph nodes. Reproductive: Findings in the prostate suggest prior TURP. Other: No abdominal wall hernia or abnormality. No abdominopelvic ascites. Musculoskeletal: Lumbosacral degenerative changes with vacuum disc changes and marginal osteophytes. Grade 1 L5 retrolisthesis. IMPRESSION: No acute abdominal or pelvic pathology identified. Electronically Signed   By: Layla Maw M.D.   On: 01/17/2023 10:16   DG Chest 2 View  Result Date: 01/16/2023 CLINICAL DATA:  Left side chest pain EXAM: CHEST - 2 VIEW COMPARISON:  01/12/2023 FINDINGS: Prior median sternotomy. Heart and mediastinal contours are within normal limits. No focal opacities or effusions. No acute bony abnormality. IMPRESSION: No active cardiopulmonary disease. Electronically Signed   By: Charlett Nose M.D.   On: 01/16/2023 23:56   DG Chest [facility]  1 View  Result Date: 01/12/2023 CLINICAL DATA:  Chest pain. EXAM: PORTABLE CHEST 1 VIEW COMPARISON:  12/12/2022 FINDINGS: Sternotomy wires unchanged. Coronary stents and left atrial appendage closure device present. Visualized cervical spine fusion hardware unchanged. Lungs are adequately inflated and otherwise clear. Cardiomediastinal silhouette and remainder of the exam is unchanged. IMPRESSION: No active disease. Electronically Signed   By: Elberta Fortis M.D.   On: 01/12/2023 12:09   CT Renal Stone Study  Result Date: 01/09/2023 CLINICAL DATA:  Flank pain. EXAM: CT ABDOMEN  AND PELVIS WITHOUT CONTRAST TECHNIQUE: Multidetector CT imaging of the abdomen and pelvis was performed following the standard protocol without IV contrast. RADIATION DOSE REDUCTION: This exam was performed according to the departmental dose-optimization program which includes automated exposure control, adjustment of the mA and/or kV according to patient size and/or use of iterative reconstruction technique. COMPARISON:  CT abdomen pelvis dated 12/16/2022. FINDINGS: Evaluation of this exam is limited in the absence of intravenous contrast. Lower chest: The visualized lung bases are clear. There is coronary vascular calcification. No intra-abdominal free air or free fluid. Hepatobiliary: The liver is unremarkable. No biliary dilatation. The gallbladder is unremarkable. Pancreas: Unremarkable. No pancreatic ductal dilatation or surrounding inflammatory changes. Spleen: Normal in size without focal abnormality. Adrenals/Urinary Tract: The adrenal glands are unremarkable. There is no hydronephrosis or nephrolithiasis on either side. The visualized ureters and urinary bladder appear unremarkable. Stomach/Bowel: There is no bowel obstruction or active inflammation. The appendix is normal. Vascular/Lymphatic: Mild aortoiliac atherosclerotic disease. The IVC is unremarkable. No portal venous gas. There is no adenopathy. Reproductive: The prostate and seminal vesicles are grossly unremarkable. No pelvic mass. Other: None Musculoskeletal: Osteopenia with degenerative changes of spine. No acute osseous pathology. IMPRESSION: 1. No acute intra-abdominal or pelvic pathology. No hydronephrosis or nephrolithiasis. 2. No bowel obstruction. Normal appendix. 3.  Aortic Atherosclerosis (ICD10-I70.0). Electronically Signed   By: Elgie Collard M.D.   On: 01/09/2023 03:16    Microbiology: Results for orders placed or performed during the hospital encounter of 11/26/22  SARS Coronavirus 2 by RT PCR (hospital order, performed in  [facility] hospital lab) *cepheid single result test* Anterior Nasal Swab     Status: None   Collection Time: 11/27/22 11:06 AM   Specimen: Anterior Nasal Swab  Result Value Ref Range Status   SARS Coronavirus 2 by RT PCR NEGATIVE NEGATIVE Final    Comment: Performed at [facility] Lab, [address], [place], [place] [address]    Labs: CBC: Recent Labs  Lab 01/22/23 0545 01/23/23 0555 01/24/23 0520 01/25/23 0758  WBC 3.4* 2.9* 3.5* 4.0  HGB 7.6* 7.5* 7.6* 8.0*  HCT 24.4* 23.7* 24.4* 25.6*  MCV 90.0 89.1 90.0 90.1  PLT 126* 135* 143* 149*   Basic Metabolic Panel: Recent Labs  Lab 01/22/23 0545 01/23/23 0555 01/24/23 0520 01/25/23 0758  NA 143 139 137 141  K 5.0 5.0 5.2* 5.3*  CL 115* 109 108 111  CO2 20* GLUCOSE 103* 96 91 92  BUN 52* 52* 50* 57*  CREATININE 3.25* 3.23* 3.59* 3.50*  CALCIUM 8.1* 8.1* 8.1* 8.4*  MG 1.5*  --   --   --    Liver Function Tests: Recent Labs  Lab 01/23/23 0555 01/24/23 0520 01/25/23 0758  AST 11* 15 12*  ALT ALKPHOS 43 46 48  BILITOT 0.4 0.6 0.4  PROT 5.9* 6.3* 6.4*  ALBUMIN 2.8* 3.0* 3.1*   CBG: Recent Labs  Lab  01/24/23 1125 01/24/23 1614 01/24/23 2107 01/25/23 0733 01/25/23 1152  GLUCAP 128* 150* 213* 93 141*    Discharge time spent: less than 30 minutes.  Signed: Rickey Barbara, MD [facility] 01/25/2023

## 2023-01-25 NOTE — TOC Progression Note (Addendum)
Transition of Care [facility]) - Progression Note    Patient Details  Name: Dayn Barich MRN: [id number] Date of Birth: 03-28-1951  Transition of Care [facility]) CM/SW Contact  Adrian Prows, RN Phone Number: 01/25/2023, 2:38 PM  Clinical Narrative:    D/C orders received for pt; spoke w/ pt in room regarding d/c plans; pt says he has been calling facility all day but no answer; discussed d/c to [facility] but pt says he will not go because "they do not help you there"; pt says he does not have anywhere to go; pt previously given resources for shelters, and SA counseling; pt says he wants an appeal; explained he is not eligible for discharge appeal; Dr Rhona Leavens notified pt would like to discuss his concerns; Dr Rhona Leavens messaged via secure chat; this CM again offered pt transportation to [facility] or shelter; pt instructed this CM "to get out of here, I want to talk to someone who know what they are talking about"; Irving Burton, RN present during conversation.  Expected Discharge Plan: Psychiatric Hospital Barriers to Discharge: No Barriers Identified  Expected Discharge Plan and Services In-house Referral: Clinical Social Work Discharge Planning Services: NA Post Acute Care Choice: NA Living arrangements for the past 2 months: Homeless Shelter Expected Discharge Date: 01/25/23               DME Arranged: N/A DME Agency: NA                   Social Determinants of Health (SDOH) Interventions SDOH Screenings   Food Insecurity: Food Insecurity Present (01/17/2023)  Housing: High Risk (01/17/2023)  Transportation Needs: Unmet Transportation Needs (01/17/2023)  Utilities: Not At Risk (01/17/2023)  Recent Concern: Utilities - At Risk (10/22/2022)  Alcohol Screen: Low Risk  (11/27/2022)  Depression (PHQ2-9): High Risk (11/12/2021)  Financial Resource Strain: Medium Risk (09/16/2018)  Physical Activity: Unknown (04/22/2019)  Social Connections: Socially Isolated (09/16/2018)  Stress: Stress Concern Present  (09/16/2018)  Tobacco Use: Low Risk  (01/17/2023)    Readmission Risk Interventions    07/05/2022   10:08 AM  Readmission Risk Prevention Plan  Transportation Screening Complete  Medication Review (RN Care Manager) Complete  PCP or Specialist appointment within 3-5 days of discharge Complete  HRI or Home Care Consult Complete  SW Recovery Care/Counseling Consult Complete  Palliative Care Screening Not Applicable  Skilled Nursing Facility Not Applicable

## 2023-01-25 NOTE — Evaluation (Signed)
Occupational Therapy Evaluation Patient Details Name: Michael [name] MRN: [id number] DOB: [birth year] Today's Date: 01/25/2023   History of Present Illness 72 y.o. male with past medical history significant for paroxysmal atrial fibrillation, CKD stage IV, CAD s/p CABG, type 2 diabetes mellitus currently diet controlled, continue cocaine abuse, homelessness who presented to [facility] ED on 4/12 complaining of passing out spells, nausea/vomiting, abdominal pain, chest pain. Dx of questionable pancreatitis, syncope vs presyncope   Clinical Impression   Pt reports he owned a cane and walker, but has lost both. He is homeless, typically independent in self care and walks stabilizing on furniture/objects when ambulating. Pt presents with balance deficits requiring min guard assist for mobility with RW and set up to min guard assist for ADLs. Will follow acutely, do not anticipate pt will need post acute OT.     Recommendations for follow up therapy are one component of a multi-disciplinary discharge planning process, led by the attending physician.  Recommendations may be updated based on patient status, additional functional criteria and insurance authorization.   Assistance Recommended at Discharge PRN  Patient can return home with the following Assist for transportation    Functional Status Assessment  Patient has had a recent decline in their functional status and demonstrates the ability to make significant improvements in function in a reasonable and predictable amount of time.  Equipment Recommendations  None recommended by OT    Recommendations for Other Services       Precautions / Restrictions Precautions Precautions: Fall Precaution Comments: 3 falls in past 6 months, most recently 3 weeks ago "legs gave out" Restrictions Weight Bearing Restrictions: No      Mobility Bed Mobility Overal bed mobility: Modified Independent                   Transfers Overall transfer level: Needs assistance Equipment used: Rolling walker (2 wheels) Transfers: Sit to/from Stand Sit to Stand: Min guard           General transfer comment: increased time and effort from bed      Balance Overall balance assessment: History of Falls, Needs assistance   Sitting balance-Leahy Scale: Good       Standing balance-Leahy Scale: Fair Standing balance comment: no LOB at sink                           ADL either performed or assessed with clinical judgement   ADL Overall ADL's : Needs assistance/impaired Eating/Feeding: Independent   Grooming: Oral care;Standing;Supervision/safety   Upper Body Bathing: Set up;Sitting   Lower Body Bathing: Min guard;Sit to/from stand   Upper Body Dressing : Set up;Sitting   Lower Body Dressing: Min guard;Sit to/from stand   Toilet Transfer: Min guard;Ambulation;Rolling walker (2 wheels)   Toileting- Clothing Manipulation and Hygiene: Set up;Sitting/lateral lean       Functional mobility during ADLs: Min guard;Rolling walker (2 wheels)       Vision   Additional Comments: reports blurred vision, needs glasses and intermittent diplopia, none currently     Perception     Praxis      Pertinent Vitals/Pain Pain Assessment Pain Assessment: Faces Faces Pain Scale: Hurts a little bit Pain Location: L chest Pain Descriptors / Indicators: Discomfort Pain Intervention(s): Monitored during session     Hand Dominance Right   Extremity/Trunk Assessment Upper Extremity Assessment Upper Extremity Assessment: Overall WFL for tasks assessed   Lower Extremity Assessment Lower Extremity  Assessment: Defer to PT evaluation RLE Deficits / Details: 4/5 B knee ext RLE Sensation: decreased light touch LLE Deficits / Details: 4/5 knee ext LLE Sensation: decreased light touch   Cervical / Trunk Assessment Cervical / Trunk Assessment: Normal   Communication Communication Communication:  No difficulties   Cognition Arousal/Alertness: Awake/alert Behavior During Therapy: WFL for tasks assessed/performed Overall Cognitive Status: Within Functional Limits for tasks assessed                                       General Comments       Exercises     Shoulder Instructions      Home Living Family/patient expects to be discharged to:: Shelter/Homeless                                 Additional Comments: had a RW and a cane, but doesn't know where it is now      Prior Functioning/Environment Prior Level of Function : History of Falls (last six months)             Mobility Comments: several falls in past 6 months, did not have RW or cane with him ADLs Comments: MOD I ADLs,        OT Problem List: Impaired balance (sitting and/or standing)      OT Treatment/Interventions: Self-care/ADL training    OT Goals(Current goals can be found in the care plan section) Acute Rehab OT Goals OT Goal Formulation: With patient Time For Goal Achievement: 02/07/23 Potential to Achieve Goals: Good ADL Goals Pt Will Transfer to Toilet: with modified independence;ambulating;regular height toilet Pt Will Perform Toileting - Clothing Manipulation and hygiene: with modified independence;sit to/from stand Additional ADL Goal #1: Pt will complete basic ADLs modified independently.  OT Frequency: Min 2X/week    Co-evaluation   Reason for Co-Treatment: To address functional/ADL transfers PT goals addressed during session: Mobility/safety with mobility;Balance;Proper use of DME        AM-PAC OT "6 Clicks" Daily Activity     Outcome Measure Help from another person eating meals?: None Help from another person taking care of personal grooming?: A Little Help from another person toileting, which includes using toliet, bedpan, or urinal?: A Little Help from another person bathing (including washing, rinsing, drying)?: A Little Help from another  person to put on and taking off regular upper body clothing?: A Little Help from another person to put on and taking off regular lower body clothing?: A Little 6 Click Score: 19   End of Session Equipment Utilized During Treatment: Gait belt;Rolling walker (2 wheels)  Activity Tolerance: Patient tolerated treatment well Patient left: in chair;with call bell/phone within reach  OT Visit Diagnosis: Unsteadiness on feet (R26.81)                Time: 4010-2725 OT Time Calculation (min): 26 min Charges:  OT General Charges $OT Visit: 1 Visit OT Evaluation $OT Eval Low Complexity: 1 Low  Berna Spare, OTR/L Acute Rehabilitation Services Office: [phone]   Evern Bio 01/25/2023, 1:09 PM

## 2023-01-25 NOTE — TOC Transition Note (Signed)
Transition of Care [facility]) - CM/SW Discharge Note   Patient Details  Name: Keyshun Elpers MRN: [id number] Date of Birth: 08/22/1951  Transition of Care [facility]) CM/SW Contact:  Adrian Prows, RN Phone Number: 01/25/2023, 3:08 PM   Clinical Narrative:    See TOC progress note dated 01/25/23; notified by Irving Burton, RN pt would like bus pass; bus pass will be provided; no TOC needs.   Final next level of care: Homeless Shelter Barriers to Discharge: No Barriers Identified   Patient Goals and CMS Choice CMS Medicare.gov Compare Post Acute Care list provided to:: Patient Choice offered to / list presented to : Patient  Discharge Placement                         Discharge Plan and Services Additional resources added to the After Visit Summary for   In-house Referral: Clinical Social Work Discharge Planning Services: NA Post Acute Care Choice: NA          DME Arranged: N/A DME Agency: NA                  Social Determinants of Health (SDOH) Interventions SDOH Screenings   Food Insecurity: Food Insecurity Present (01/17/2023)  Housing: High Risk (01/17/2023)  Transportation Needs: Unmet Transportation Needs (01/17/2023)  Utilities: Not At Risk (01/17/2023)  Recent Concern: Utilities - At Risk (10/22/2022)  Alcohol Screen: Low Risk  (11/27/2022)  Depression (PHQ2-9): High Risk (11/12/2021)  Financial Resource Strain: Medium Risk (09/16/2018)  Physical Activity: Unknown (04/22/2019)  Social Connections: Socially Isolated (09/16/2018)  Stress: Stress Concern Present (09/16/2018)  Tobacco Use: Low Risk  (01/17/2023)     Readmission Risk Interventions    07/05/2022   10:08 AM  Readmission Risk Prevention Plan  Transportation Screening Complete  Medication Review (RN Care Manager) Complete  PCP or Specialist appointment within 3-5 days of discharge Complete  HRI or Home Care Consult Complete  SW Recovery Care/Counseling Consult Complete  Palliative Care  Screening Not Applicable  Skilled Nursing Facility Not Applicable

## 2023-01-25 NOTE — Plan of Care (Signed)
  Problem: Education: Goal: Ability to describe self-care measures that may prevent or decrease complications (Diabetes Survival Skills Education) will improve Outcome: Progressing   Problem: Education: Goal: Individualized Educational Video(s) Outcome: Progressing   Problem: Coping: Goal: Ability to adjust to condition or change in health will improve Outcome: Progressing   

## 2023-01-25 NOTE — Evaluation (Signed)
Physical Therapy Evaluation Patient Details Name: Michael [name] MRN: [id number] DOB: 06-14-1951 Today's Date: 01/25/2023  History of Present Illness  72 y.o. male with past medical history significant for paroxysmal atrial fibrillation, CKD stage IV, CAD s/p CABG, type 2 diabetes mellitus currently diet controlled, continue cocaine abuse, homelessness who presented to [facility] ED on 4/12 complaining of passing out spells, nausea/vomiting, abdominal pain, chest pain. Dx of questionable pancreatitis, syncope vs presyncope  Clinical Impression  Pt admitted with above diagnosis. Pt ambulated 150' with RW, no loss of balance. He reports multiple falls in the past 6 months 2* his "legs giving out". Pt is homeless and stated he sometimes stays in motels, but he is not sure of where he will go upon DC. He has lost his RW and would benefit from a rollator due to high falls risk.  Pt currently with functional limitations due to the deficits listed below (see PT Problem List). Pt will benefit from acute skilled PT to increase their independence and safety with mobility to allow discharge.          Recommendations for follow up therapy are one component of a multi-disciplinary discharge planning process, led by the attending physician.  Recommendations may be updated based on patient status, additional functional criteria and insurance authorization.  Follow Up Recommendations       Assistance Recommended at Discharge None  Patient can return home with the following       Equipment Recommendations Rollator (4 wheels) (pt lost his RW)  Recommendations for Other Services       Functional Status Assessment Patient has had a recent decline in their functional status and demonstrates the ability to make significant improvements in function in a reasonable and predictable amount of time.     Precautions / Restrictions Precautions Precautions: Fall Precaution Comments: 3 falls in past 6  months, most recently 3 weeks ago "legs gave out" Restrictions Weight Bearing Restrictions: No      Mobility  Bed Mobility Overal bed mobility: Modified Independent             General bed mobility comments: HOB up, used rail    Transfers Overall transfer level: Needs assistance Equipment used: Rolling walker (2 wheels) Transfers: Sit to/from Stand Sit to Stand: Min guard, From elevated surface           General transfer comment: used momentum,  took 2 attempts to successfully power up    Ambulation/Gait Ambulation/Gait assistance: Min guard Gait Distance (Feet): 150 Feet Assistive device: Rolling walker (2 wheels) Gait Pattern/deviations: Decreased stride length Gait velocity: decr     General Gait Details: no buckling, no loss of balance  Stairs            Wheelchair Mobility    Modified Rankin (Stroke Patients Only)       Balance Overall balance assessment: History of Falls, Needs assistance   Sitting balance-Leahy Scale: Good     Standing balance support: Bilateral upper extremity supported, During functional activity, Reliant on assistive device for balance Standing balance-Leahy Scale: Fair                               Pertinent Vitals/Pain Pain Assessment Pain Assessment: No/denies pain    Home Living Family/patient expects to be discharged to:: Shelter/Homeless                   Additional Comments: had a  RW but doesn't know where it is now    Prior Function Prior Level of Function : History of Falls (last six months)             Mobility Comments: several falls in past 6 months, did not have RW or cane with him ADLs Comments: MOD I ADLs,     Hand Dominance   Dominant Hand: Right    Extremity/Trunk Assessment   Upper Extremity Assessment Upper Extremity Assessment: Defer to OT evaluation    Lower Extremity Assessment Lower Extremity Assessment: LLE deficits/detail;RLE deficits/detail (4/5 B  knee extension) RLE Deficits / Details: 4/5 B knee ext RLE Sensation: decreased light touch LLE Deficits / Details: 4/5 knee ext LLE Sensation: decreased light touch    Cervical / Trunk Assessment Cervical / Trunk Assessment: Normal  Communication   Communication: No difficulties  Cognition Arousal/Alertness: Awake/alert Behavior During Therapy: WFL for tasks assessed/performed Overall Cognitive Status: Within Functional Limits for tasks assessed                                          General Comments      Exercises     Assessment/Plan    PT Assessment Patient needs continued PT services  PT Problem List Decreased balance;Decreased activity tolerance;Decreased mobility       PT Treatment Interventions Gait training;Balance training;Patient/family education    PT Goals (Current goals can be found in the Care Plan section)  Acute Rehab PT Goals Patient Stated Goal: to be able to walk farther PT Goal Formulation: With patient Time For Goal Achievement: 03/08/23 Potential to Achieve Goals: Good    Frequency Min 1X/week     Co-evaluation PT/OT/SLP Co-Evaluation/Treatment: Yes Reason for Co-Treatment: To address functional/ADL transfers PT goals addressed during session: Mobility/safety with mobility;Balance;Proper use of DME         AM-PAC PT "6 Clicks" Mobility  Outcome Measure Help needed turning from your back to your side while in a flat bed without using bedrails?: None Help needed moving from lying on your back to sitting on the side of a flat bed without using bedrails?: None Help needed moving to and from a bed to a chair (including a wheelchair)?: A Little Help needed standing up from a chair using your arms (e.g., wheelchair or bedside chair)?: None Help needed to walk in hospital room?: A Little Help needed climbing 3-5 steps with a railing? : A Little 6 Click Score: 21    End of Session Equipment Utilized During Treatment: Gait  belt Activity Tolerance: Patient tolerated treatment well Patient left: in chair;with chair alarm set;with call bell/phone within reach Nurse Communication: Mobility status PT Visit Diagnosis: Muscle weakness (generalized) (M62.81)    Time: 1191-4782 PT Time Calculation (min) (ACUTE ONLY): 22 min   Charges:   PT Evaluation $PT Eval Moderate Complexity: 1 Mod         Tamala Ser PT 01/25/2023  Acute Rehabilitation Services  Office [phone]

## 2023-01-27 ENCOUNTER — Emergency Department (HOSPITAL_COMMUNITY)
Admission: EM | Admit: 2023-01-27 | Discharge: 2023-01-27 | Disposition: A | Discharge status: Home / Self Care | Payer: 59 | Attending: Emergency Medicine | Admitting: Emergency Medicine

## 2023-01-27 ENCOUNTER — Emergency Department (HOSPITAL_COMMUNITY): Payer: 59

## 2023-01-27 DIAGNOSIS — G8929 Other chronic pain: Secondary | ICD-10-CM | POA: Insufficient documentation

## 2023-01-27 DIAGNOSIS — R45851 Suicidal ideations: Secondary | ICD-10-CM | POA: Insufficient documentation

## 2023-01-27 DIAGNOSIS — I129 Hypertensive chronic kidney disease with stage 1 through stage 4 chronic kidney disease, or unspecified chronic kidney disease: Secondary | ICD-10-CM | POA: Diagnosis not present

## 2023-01-27 DIAGNOSIS — Z951 Presence of aortocoronary bypass graft: Secondary | ICD-10-CM | POA: Diagnosis not present

## 2023-01-27 DIAGNOSIS — J45909 Unspecified asthma, uncomplicated: Secondary | ICD-10-CM | POA: Insufficient documentation

## 2023-01-27 DIAGNOSIS — E1122 Type 2 diabetes mellitus with diabetic chronic kidney disease: Secondary | ICD-10-CM | POA: Diagnosis not present

## 2023-01-27 DIAGNOSIS — F1414 Cocaine abuse with cocaine-induced mood disorder: Secondary | ICD-10-CM | POA: Diagnosis present

## 2023-01-27 DIAGNOSIS — I2510 Atherosclerotic heart disease of native coronary artery without angina pectoris: Secondary | ICD-10-CM | POA: Diagnosis not present

## 2023-01-27 DIAGNOSIS — N184 Chronic kidney disease, stage 4 (severe): Secondary | ICD-10-CM | POA: Diagnosis not present

## 2023-01-27 DIAGNOSIS — Z21 Asymptomatic human immunodeficiency virus [HIV] infection status: Secondary | ICD-10-CM | POA: Insufficient documentation

## 2023-01-27 LAB — RAPID URINE DRUG SCREEN, HOSP PERFORMED
Amphetamines: NOT DETECTED
Barbiturates: NOT DETECTED
Benzodiazepines: NOT DETECTED
Cocaine: POSITIVE — AB
Opiates: NOT DETECTED
Tetrahydrocannabinol: NOT DETECTED

## 2023-01-27 LAB — ACETAMINOPHEN LEVEL: Acetaminophen (Tylenol), Serum: <10 ug/mL — ABNORMAL LOW (ref 10–30)

## 2023-01-27 LAB — CBC
HCT: 30.2 % — ABNORMAL LOW (ref 39.0–52.0)
Hemoglobin: 9.8 g/dL — ABNORMAL LOW (ref 13.0–17.0)
MCH: 28.5 pg (ref 26.0–34.0)
MCHC: 32.5 g/dL (ref 30.0–36.0)
MCV: 87.8 fL (ref 80.0–100.0)
Platelets: 160 10*3/uL (ref 150–400)
RBC: 3.44 MIL/uL — ABNORMAL LOW (ref 4.22–5.81)
RDW: 13.5 % (ref 11.5–15.5)
WBC: 4.8 10*3/uL (ref 4.0–10.5)
nRBC: 0 % (ref 0.0–0.2)

## 2023-01-27 LAB — COMPREHENSIVE METABOLIC PANEL
ALT: 20 U/L (ref 0–44)
AST: 19 U/L (ref 15–41)
Albumin: 3.8 g/dL (ref 3.5–5.0)
Alkaline Phosphatase: 60 U/L (ref 38–126)
Anion gap: 13 (ref 5–15)
BUN: 59 mg/dL — ABNORMAL HIGH (ref 8–23)
CO2: 20 mmol/L — ABNORMAL LOW (ref 22–32)
Calcium: 8.9 mg/dL (ref 8.9–10.3)
Chloride: 106 mmol/L (ref 98–111)
Creatinine, Ser: 3.35 mg/dL — ABNORMAL HIGH (ref 0.61–1.24)
GFR, Estimated: 19 mL/min — ABNORMAL LOW (ref >60)
Glucose, Bld: 144 mg/dL — ABNORMAL HIGH (ref 70–99)
Potassium: 4.8 mmol/L (ref 3.5–5.1)
Sodium: 139 mmol/L (ref 135–145)
Total Bilirubin: 0.4 mg/dL (ref 0.3–1.2)
Total Protein: 7.8 g/dL (ref 6.5–8.1)

## 2023-01-27 LAB — TROPONIN I (HIGH SENSITIVITY)
Troponin I (High Sensitivity): 25 ng/L — ABNORMAL HIGH (ref <18)
Troponin I (High Sensitivity): 25 ng/L — ABNORMAL HIGH (ref <18)

## 2023-01-27 LAB — LIPASE, BLOOD: Lipase: 86 U/L — ABNORMAL HIGH (ref 11–51)

## 2023-01-27 LAB — ETHANOL: Alcohol, Ethyl (B): <10 mg/dL (ref <10)

## 2023-01-27 LAB — SALICYLATE LEVEL: Salicylate Lvl: <7 mg/dL — ABNORMAL LOW (ref 7.0–30.0)

## 2023-01-27 MED ORDER — ALBUTEROL SULFATE HFA 108 (90 BASE) MCG/ACT IN AERS: 1.0000 | INHALATION_SPRAY | Freq: Four times a day (QID) | RESPIRATORY_TRACT | Status: DC | PRN

## 2023-01-27 MED ORDER — RENA-VITE PO TABS
1.0000 | ORAL_TABLET | Freq: Every day | ORAL | Status: DC
  Filled 2023-01-27: qty 1

## 2023-01-27 MED ORDER — FOLIC ACID 1 MG PO TABS
1.0000 mg | ORAL_TABLET | Freq: Every day | ORAL | Status: DC
  Administered 2023-01-27: 1 mg via ORAL
  Filled 2023-01-27: qty 1

## 2023-01-27 MED ORDER — QUETIAPINE FUMARATE 25 MG PO TABS: 50.0000 mg | ORAL_TABLET | Freq: Every day | ORAL | Status: DC

## 2023-01-27 MED ORDER — ATORVASTATIN CALCIUM 40 MG PO TABS
40.0000 mg | ORAL_TABLET | Freq: Every day | ORAL | Status: DC
  Administered 2023-01-27: 40 mg via ORAL
  Filled 2023-01-27: qty 1

## 2023-01-27 MED ORDER — SODIUM BICARBONATE 650 MG PO TABS
650.0000 mg | ORAL_TABLET | Freq: Three times a day (TID) | ORAL | Status: DC
  Administered 2023-01-27: 650 mg via ORAL
  Filled 2023-01-27: qty 1

## 2023-01-27 MED ORDER — PANTOPRAZOLE SODIUM 40 MG PO TBEC
40.0000 mg | DELAYED_RELEASE_TABLET | Freq: Every day | ORAL | Status: DC
  Administered 2023-01-27: 40 mg via ORAL
  Filled 2023-01-27 (×2): qty 1

## 2023-01-27 MED ORDER — FERROUS SULFATE 325 (65 FE) MG PO TABS
325.0000 mg | ORAL_TABLET | Freq: Every day | ORAL | Status: DC
  Administered 2023-01-27: 325 mg via ORAL
  Filled 2023-01-27 (×2): qty 1

## 2023-01-27 MED ORDER — ASPIRIN 81 MG PO CHEW
81.0000 mg | CHEWABLE_TABLET | Freq: Every day | ORAL | Status: DC
  Administered 2023-01-27: 81 mg via ORAL
  Filled 2023-01-27: qty 1

## 2023-01-27 MED ORDER — OXYCODONE HCL 5 MG PO TABS
5.0000 mg | ORAL_TABLET | Freq: Once | ORAL | Status: AC
  Administered 2023-01-27: 5 mg via ORAL
  Filled 2023-01-27: qty 1

## 2023-01-27 MED ORDER — FLUTICASONE FUROATE-VILANTEROL 200-25 MCG/ACT IN AEPB
1.0000 | INHALATION_SPRAY | Freq: Every day | RESPIRATORY_TRACT | Status: DC
  Filled 2023-01-27: qty 28

## 2023-01-27 MED ORDER — HYDRALAZINE HCL 25 MG PO TABS
25.0000 mg | ORAL_TABLET | Freq: Three times a day (TID) | ORAL | Status: DC
  Administered 2023-01-27: 25 mg via ORAL
  Filled 2023-01-27: qty 1

## 2023-01-27 MED ORDER — ALBUTEROL SULFATE (2.5 MG/3ML) 0.083% IN NEBU: 2.5000 mg | INHALATION_SOLUTION | RESPIRATORY_TRACT | Status: DC | PRN

## 2023-01-27 MED ORDER — NITROGLYCERIN 0.4 MG SL SUBL: 0.4000 mg | SUBLINGUAL_TABLET | SUBLINGUAL | Status: DC | PRN

## 2023-01-27 MED ORDER — FLUOXETINE HCL 20 MG PO CAPS
20.0000 mg | ORAL_CAPSULE | Freq: Every day | ORAL | Status: DC
  Administered 2023-01-27: 20 mg via ORAL
  Filled 2023-01-27: qty 1

## 2023-01-27 MED ORDER — DILTIAZEM HCL ER COATED BEADS 180 MG PO CP24
180.0000 mg | ORAL_CAPSULE | Freq: Every day | ORAL | Status: DC
  Administered 2023-01-27: 180 mg via ORAL
  Filled 2023-01-27: qty 1

## 2023-01-27 MED ORDER — ISOSORBIDE MONONITRATE ER 30 MG PO TB24
15.0000 mg | ORAL_TABLET | Freq: Every day | ORAL | Status: DC
  Administered 2023-01-27: 15 mg via ORAL
  Filled 2023-01-27: qty 1

## 2023-01-27 MED ORDER — POLYETHYLENE GLYCOL 3350 17 G PO PACK
17.0000 g | PACK | Freq: Two times a day (BID) | ORAL | Status: DC
  Administered 2023-01-27: 17 g via ORAL
  Filled 2023-01-27: qty 1

## 2023-01-27 NOTE — ED Provider Notes (Signed)
[facility]-EMERGENCY DEPT [facility] Emergency Department Provider Note MRN:  [id number]  Arrival date & time: 01/27/23     Chief Complaint   Suicidal History of Present Illness   Michael [name] is a 72 y.o. year-old male with a history of CAD, CKD presenting to the ED with chief complaint of suicidal.  Patient explains he has been feeling suicidal recently.  Tired of being sick and in pain all the time.  No specific plan.  Having some continued chest and abdominal pain.  Review of Systems  A thorough review of systems was obtained and all systems are negative except as noted in the HPI and PMH.   Patient's Health History    Past Medical History:  Diagnosis Date   A-fib    Anemia    Asthma    No PFTs, history of childhood asthma   CAD (coronary artery disease)    Cellulitis 04/2014   left facial   Cellulitis and abscess of toe of right foot 12/08/2019   Chondromalacia of medial femoral condyle    Left knee MRI 04/28/12: Chondromalacia of the medial femoral condyle with slight peripheral degeneration of the meniscocapsular junction of the medial meniscus; followed by sports medicine   Chronic kidney failure, stage 4 (severe)    Collagen vascular disease    Crack cocaine use    for 20+ years, has been enrolled in detox programs in the past   Depression    with history of hospitalization for suicidal ideation   Diabetes mellitus 2002   Diagnosed in 2002, started insulin in 2012   Gout    Headache(784.0)    CT head 08/2011: Periventricular and subcortical white matter hypodensities are most in keeping with chronic microangiopathic change   HIV infection 08/2011   Followed by Dr. Ninetta Lights   Hyperlipidemia    Hypertension    Pulmonary embolism     Past Surgical History:  Procedure Laterality Date   AMPUTATION Right 07/21/2019   Procedure: RIGHT SECOND TOE AMPUTATION;  Surgeon: Nadara Mustard, MD;  Location: [facility] OR;  Service: Orthopedics;  Laterality: Right;   BACK  SURGERY     1988   BOWEL RESECTION     CARDIAC SURGERY     CERVICAL SPINE SURGERY     " rods in my neck "   CORONARY ARTERY BYPASS GRAFT     CORONARY STENT PLACEMENT     NM MYOCAR PERF WALL MOTION  12/27/2011   normal   SPINE SURGERY      Family History  Problem Relation Age of Onset   Diabetes Mother    Hypertension Mother    Hyperlipidemia Mother    Diabetes Father    Cancer Father    Hypertension Father    Diabetes Brother    Heart disease Brother    Diabetes Sister    Colon cancer Neg Hx     Social History   Socioeconomic History   Marital status: Widowed    Spouse name: Not on file   Number of children: 2   Years of education: 65   Highest education level: 12th grade  Occupational History    Employer: UNEMPLOYED    Comment: 04/2016  Tobacco Use   Smoking status: Never   Smokeless tobacco: Never  Vaping Use   Vaping Use: Never used  Substance and Sexual Activity   Alcohol use: No    Alcohol/week: 4.0 standard drinks of alcohol    Types: 2 Cans of beer, 2 Shots of  liquor per week   Drug use: Not Currently    Frequency: 4.0 times per week    Types: "Crack" cocaine, Cocaine    Comment: Recent use of crack   Sexual activity: Yes    Comment: DECLINED CONDOMS  Other Topics Concern   Not on file  Social History Narrative   Currently staying with a friend in [place].  Was staying @ local motel until a few days ago - left b/c of bed bugs.   Picked up from [facility].  Not followed by a psychiatrist.   Social Determinants of Health   Financial Resource Strain: Medium Risk (09/16/2018)   Overall Financial Resource Strain (CARDIA)    Difficulty of Paying Living Expenses: Somewhat hard  Food Insecurity: Food Insecurity Present (01/17/2023)   Hunger Vital Sign    Worried About Running Out of Food in the Last Year: Often true    Ran Out of Food in the Last Year: Often true  Transportation Needs: Unmet Transportation Needs (01/17/2023)   PRAPARE - Therapist, art (Medical): Yes    Lack of Transportation (Non-Medical): Yes  Physical Activity: Unknown (04/22/2019)   Exercise Vital Sign    Days of Exercise per Week: Not on file    Minutes of Exercise per Session: 0 min  Stress: Stress Concern Present (09/16/2018)   [employer] of Occupational Health - Occupational Stress Questionnaire    Feeling of Stress : Very much  Social Connections: Socially Isolated (09/16/2018)   Social Connection and Isolation Panel [NHANES]    Frequency of Communication with Friends and Family: Once a week    Frequency of Social Gatherings with Friends and Family: Never    Attends Religious Services: Never    Database administrator or Organizations: No    Attends Banker Meetings: Never    Marital Status: Widowed  Intimate Partner Violence: Not At Risk (01/17/2023)   Humiliation, Afraid, Rape, and Kick questionnaire    Fear of Current or Ex-Partner: No    Emotionally Abused: No    Physically Abused: No    Sexually Abused: No     Physical Exam   Vitals:   01/27/23 0202 01/27/23 0210  BP: (!) 205/122   Pulse: 83   Resp:  16  Temp: 98 F (36.7 C)   SpO2: 100%     CONSTITUTIONAL: Chronically ill-appearing, NAD NEURO/PSYCH:  Alert and oriented x 3, no focal deficits EYES:  eyes equal and reactive ENT/NECK:  no LAD, no JVD CARDIO: Regular rate, well-perfused, normal S1 and S2 PULM:  CTAB no wheezing or rhonchi GI/GU:  non-distended, non-tender MSK/SPINE:  No gross deformities, no edema SKIN:  no rash, atraumatic   *Additional and/or pertinent findings included in MDM below  Diagnostic and Interventional Summary    EKG Interpretation  Date/Time:  Monday January 27 2023 03:22:24 EDT Ventricular Rate:  82 PR Interval:  152 QRS Duration: 78 QT Interval:  372 QTC Calculation: 434 R Axis:   62 Text Interpretation: Normal sinus rhythm with sinus arrhythmia Minimal voltage criteria for LVH, may be normal variant (  Sokolow-Lyon ) Nonspecific T wave abnormality Abnormal ECG When compared with ECG of 27-Jan-2023 01:48, PREVIOUS ECG IS PRESENT Confirmed by Kennis Carina [phone]) on 01/27/2023 6:09:51 AM       Labs Reviewed  CBC - Abnormal; Notable for the following components:      Result Value   RBC 3.44 (*)    Hemoglobin 9.8 (*)  HCT 30.2 (*)    All other components within normal limits  LIPASE, BLOOD - Abnormal; Notable for the following components:   Lipase 86 (*)    All other components within normal limits  COMPREHENSIVE METABOLIC PANEL - Abnormal; Notable for the following components:   CO2 20 (*)    Glucose, Bld 144 (*)    BUN 59 (*)    Creatinine, Ser 3.35 (*)    GFR, Estimated 19 (*)    All other components within normal limits  SALICYLATE LEVEL - Abnormal; Notable for the following components:   Salicylate Lvl <7.0 (*)    All other components within normal limits  ACETAMINOPHEN LEVEL - Abnormal; Notable for the following components:   Acetaminophen (Tylenol), Serum <10 (*)    All other components within normal limits  RAPID URINE DRUG SCREEN, HOSP PERFORMED - Abnormal; Notable for the following components:   Cocaine POSITIVE (*)    All other components within normal limits  TROPONIN I (HIGH SENSITIVITY) - Abnormal; Notable for the following components:   Troponin I (High Sensitivity) 25 (*)    All other components within normal limits  TROPONIN I (HIGH SENSITIVITY) - Abnormal; Notable for the following components:   Troponin I (High Sensitivity) 25 (*)    All other components within normal limits  ETHANOL    DG Chest 2 View  Final Result      Medications  oxyCODONE (Oxy IR/ROXICODONE) immediate release tablet 5 mg (has no administration in time range)     Procedures  /  Critical Care Procedures  ED Course and Medical Decision Making  Initial Impression and Ddx Patient sleeping peacefully on my initial evaluation.  Vitals overall reassuring, does have some hypertension  noted in triage.  He wakes easily, he is very pleasant, he admits to depression recently.  Says that his abdominal pain has not really much better since he was discharged from the hospital for pancreatitis.  The abdomen is overall soft and there is minimal tenderness.  Past medical/surgical history that increases complexity of ED encounter: CKD, CAD  Interpretation of Diagnostics I personally reviewed the EKG and my interpretation is as follows: Sinus rhythm without concerning changes from prior.  Labs reveal minimally elevated troponin that is flat upon repeat.  Creatinine near baseline, no significant blood count or electrolyte disturbance.  Patient Reassessment and Ultimate Disposition/Management     I see no clear indication for advanced imaging or further testing at this time.  Patient is medically cleared, will await TTS recommendations regarding the suicidality.  Signed out to default provider.  Patient management required discussion with the following services or consulting groups:  None  Complexity of Problems Addressed Acute illness or injury that poses threat of life of bodily function  Additional Data Reviewed and Analyzed Further history obtained from: Recent discharge summary  Additional Factors Impacting ED Encounter Risk Consideration of hospitalization  Elmer Sow. Pilar Plate, MD [facility] Emergency Medicine [facility] [email or web address]  Final Clinical Impressions(s) / ED Diagnoses     ICD-10-CM   1. Suicidal ideation  R45.851     2. Other chronic pain  G89.29       ED Discharge Orders     None        Discharge Instructions Discussed with and Provided to Patient:   Discharge Instructions   None      Sabas Sous, MD 01/27/23 [phone]

## 2023-01-27 NOTE — Discharge Instructions (Signed)
[facility]  Hours Monday - Friday: Services: 8:00AM - 3:00PM Offices: 8:00AM - 5:00PM  Physical Address [address] [place], [place] [address]   Please use this address for IRC Mailing Address [address] [address] , [place] [address]  The IRC helps people reconnect This is a safe place to rest, take care of basic needs and access the services and community that make all the difference. Our guests come to the IRC to take a class, do laundry, meet with a case manager or to get their mail. Sometimes they just need to sit in our dayroom and enjoy a conversation.  Here you will find everything from shower facilities to a computer lab, a mail room, classrooms and meeting spaces.  The IRC helps people reconnect with their own lives and with the community at large.  A caring community setting One of the most exciting aspects of the IRC is that so many individuals and organizations in the community are a part of the everyday experience. Whether it's a hair stylist or law firm offering services right in-house, our partners make the IRC a truly interactive resource center where services are brought to our guests. The IRC brings together a comprehensive community of talented people who not only want to help solve problems, but also to be a part of our guests' lives.  Integrated Care We take a person-centered approach to assistance that includes: Case management [facility] Mental health nurse Referrals  Fundamental Services We start with necessities: Showers and hygiene supplies Laundry Phone access Mailing addresses and mailboxes Replacement IDs Onsite barbershop Storage lockers [place] winter warming center  Self-Sufficiency We connect our guests with: Skilled trade classes Job skills classes Resume and jobs application assistance Interview training GED classes Professional clothing vouchers Financial literacy Outpatient  psychiatric Services  Walk in hours for medication management Monday, Wednesday, Thursday, and Friday from 8:00 AM to 11:00 AM Recommend arriving by by 7:30 AM.  It is first come first serve.    Walk in hours for therapy intake Monday and Wednesday only 8:00 AM to 11:00 AM Encouraged to arrive by 7:30 AM.  It is first come first serve   Inpatient patient psychiatric services The [facility] offers comprehensive behavioral heath care services for mental health and substance abuse treatment.  Social work can also assist with referral to or getting you into a rehabilitation program short or long term   

## 2023-01-27 NOTE — ED Triage Notes (Signed)
Pt arrives from the [facility] with CP, constipation, and weakness. Ongoing for about 5 days, becoming worse. Hx of MI, stroke, htn, DM. Left sided deficits from stroke. Stroke screen negative. Has not had BP meds since yesterday. En route 217/118, hr 83, 100% ra. CBG 171. Also mentioned thoughts of SI d/t his health conditions.

## 2023-01-27 NOTE — ED Provider Notes (Signed)
Emergency Medicine Observation Re-evaluation Note  Michael [name] is a 72 y.o. male, seen on rounds today.  Pt initially presented to the ED for complaints of No chief complaint on file. Currently, the patient is resting in NAD.  Physical Exam  BP (!) 205/122 (BP Location: Right Arm)   Pulse 83   Temp 98 F (36.7 C) (Oral)   Resp 16   SpO2 100%  Physical Exam General: Appears to be resting comfortably in bed, no acute distress. Cardiac: Regular rate, normal heart rate, elevated  blood pressure for this morning's vitals. Lungs: No increased work of breathing.  Equal chest rise appreciated Psych: Calm, asleep in bed.   ED Course / MDM  EKG:EKG Interpretation  Date/Time:  Monday January 27 2023 03:22:24 EDT Ventricular Rate:  82 PR Interval:  152 QRS Duration: 78 QT Interval:  372 QTC Calculation: 434 R Axis:   62 Text Interpretation: Normal sinus rhythm with sinus arrhythmia Minimal voltage criteria for LVH, may be normal variant ( Sokolow-Lyon ) Nonspecific T wave abnormality Abnormal ECG When compared with ECG of 27-Jan-2023 01:48, PREVIOUS ECG IS PRESENT Confirmed by Kennis Carina [phone]) on 01/27/2023 6:09:51 AM  I have reviewed the labs performed to date as well as medications administered while in observation.    Plan  Current plan is for psychiatric evaluation. Home BP medications all ordered but not yet administered.   Glyn Ade, MD 01/27/23 [phone]

## 2023-01-27 NOTE — Consult Note (Signed)
[facility] ED ASSESSMENT   Reason for Consult:  SI Referring Physician:  [place] Patient Identification: Michael [name] MRN:  [id number] ED Chief Complaint: Cocaine abuse with cocaine-induced mood disorder  Diagnosis:  Principal Problem:   Cocaine abuse with cocaine-induced mood disorder   ED Assessment Time Calculation: Start Time: 1000 Stop Time: 1100 Total Time in Minutes (Assessment Completion): 60   HPI:   Michael [name] is a 72 y.o. male patient with history of MDD and cocaine induced mood disorder who presented to ED yesterday from the [facility]. Pt reports chronic suicidal ideations related to homelessness and decline in physical health.   Per chart review, patient does present to ED/UC multiple times per month due to chest pain, cocaine abuse, and SI. Pt most recently stabilized and discharged by psychiatry on April 19th. Please see note done by Michael Bee, NP on 01/24/23 which states,  "Michael [name] is a 72 y.o. male suicidal thoughts that are chronic in nature, no plan.  His suicidality did improve with admission, which suggests patient's symptoms are consistent with substance-induced mood disorder.  Patient is very well-known to behavioral health service line, as he is noted to have 14 emergency room visits this year, with 4 inpatient hospitalizations (1 psychiatric hospitalization).  Majority of his visits were consistent with chest pain, cocaine abuse, polysubstance abuse, pancreatitis, and renal injuries.  While patient does have risk factors for suicide including 1 previous attempt, limited coping, hopelessness, homelessness, and noncompliant with medication.  It does seem that he is attempting to get better, very familiar with emergency services, has no problems seeking help when needed, and endorsing wanting to remain sober. However, it is possible that he is amplifying his symptoms symptoms in order to obtain secondary gain and I do favor malingering for shelter as  the most likely diagnosis in this case.  This seems to be supported by patient's numerous emergency room visits, recent hospitalization (12-day length of stay) in which he seemed to have benefited little from, and was dissatisfied with the length of stay despite being acute inpatient hospitalization.  Patient did not engage in any attempts to find suitable housing or a safe discharge planning despite being given multiple resources and encourage to call facilities. Given repeated hospitalizations (including to [facility] dc in March) with little benefit, I do not believe that psychiatric admission will significantly mitigate his risk, which is strongly related to limited access of resources including housing and worsening medical conditions due to noncompliance/adherence to treatment.. He would benefit most from connection with these resources. Hospitalization may actually discourage him from utilizing outpatient resources. I have discussed these considerations with Michael [name] , who voiced an understanding despite still desiring an admission strong suspicion for secondary gain (housing)."  Subjective:   Patient seen this morning at [facility] for face to face psychiatric evaluation. Since April 19th, pt states he relapsed on cocaine and has been staying at the [facility]. Pt states he does not want to return to the [facility], he does not like it there, and started having suicidal ideations which is why he presented to ED. I attempted to ask patient if he took any steps towards housing, substance abuse treatment, OP follow up, or anything to help better his mental health and psychosocial needs since discharge a few days ago and he stated no. When asked if patient was motivated to become sober he stated "I don't know." Pt endorses SI, does report it being chronic in nature do to his circumstances. Denies  any plan or intent. He denies HI. Denies AVH. Denies alcohol use. Reports "okay" sleep and good appetite. He denies having any  close family or friends he keeps in contact with. He is able to mention protective factors such as "I want to live" and religious beliefs.   During conversation, I essentially reiterated what Michael Dow, NP stated above. We spoke about how if he is chronically suicidal over homelessness, drug use, and decline in health then it would be critical for him to follow up with resources provided for substance abuse treatment and DSS for housing/disability. Pt acknowledged he has not done any of those tasks yet. WE also spoke about how inpatient treatment is probably not appropriate, and outpatient treatment needs to be utilized first. He agreed with this and expressed understanding. Pt is able to contract for safety at this time. Will psychiatrically clear patient.   Past Psychiatric History:  See above  Risk to Self or Others: Is the patient at risk to self? No Has the patient been a risk to self in the past 6 months? Yes Has the patient been a risk to self within the distant past? Yes Is the patient a risk to others? No Has the patient been a risk to others in the past 6 months? No Has the patient been a risk to others within the distant past? No  [place] Scale:  Flowsheet Row ED from 01/27/2023 in [facility] Emergency Department at [facility] ED to Hosp-Admission (Discharged) from 01/17/2023 in [facility] ED from [facility]/2023 in [facility] at [facility]  C-SSRS RISK CATEGORY Low Risk High Risk Moderate Risk       Past Medical History:  Past Medical History:  Diagnosis Date   A-fib    Anemia    Asthma    No PFTs, history of childhood asthma   CAD (coronary artery disease)    Cellulitis 04/2014   left facial   Cellulitis and abscess of toe of right foot 12/08/2019   Chondromalacia of medial femoral condyle    Left knee MRI 04/28/12: Chondromalacia of the medial femoral condyle with slight peripheral degeneration of the  meniscocapsular junction of the medial meniscus; followed by sports medicine   Chronic kidney failure, stage 4 (severe)    Collagen vascular disease    Crack cocaine use    for 20+ years, has been enrolled in detox programs in the past   Depression    with history of hospitalization for suicidal ideation   Diabetes mellitus 2002   Diagnosed in 2002, started insulin in 2012   Gout    Headache(784.0)    CT head 08/2011: Periventricular and subcortical white matter hypodensities are most in keeping with chronic microangiopathic change   HIV infection 08/2011   Followed by Dr. Ninetta Lights   Hyperlipidemia    Hypertension    Pulmonary embolism     Past Surgical History:  Procedure Laterality Date   AMPUTATION Right 07/21/2019   Procedure: RIGHT SECOND TOE AMPUTATION;  Surgeon: Nadara Mustard, MD;  Location: [facility] OR;  Service: Orthopedics;  Laterality: Right;   BACK SURGERY     1988   BOWEL RESECTION     CARDIAC SURGERY     CERVICAL SPINE SURGERY     " rods in my neck "   CORONARY ARTERY BYPASS GRAFT     CORONARY STENT PLACEMENT     NM MYOCAR PERF WALL MOTION  12/27/2011  normal   SPINE SURGERY     Family History:  Family History  Problem Relation Age of Onset   Diabetes Mother    Hypertension Mother    Hyperlipidemia Mother    Diabetes Father    Cancer Father    Hypertension Father    Diabetes Brother    Heart disease Brother    Diabetes Sister    Colon cancer Neg Hx    Social History:  Social History   Substance and Sexual Activity  Alcohol Use No   Alcohol/week: 4.0 standard drinks of alcohol   Types: 2 Cans of beer, 2 Shots of liquor per week     Social History   Substance and Sexual Activity  Drug Use Not Currently   Frequency: 4.0 times per week   Types: "Crack" cocaine, Cocaine   Comment: Recent use of crack    Social History   Socioeconomic History   Marital status: Widowed    Spouse name: Not on file   Number of children: 2   Years of education: 82    Highest education level: 12th grade  Occupational History    Employer: UNEMPLOYED    Comment: 04/2016  Tobacco Use   Smoking status: Never   Smokeless tobacco: Never  Vaping Use   Vaping Use: Never used  Substance and Sexual Activity   Alcohol use: No    Alcohol/week: 4.0 standard drinks of alcohol    Types: 2 Cans of beer, 2 Shots of liquor per week   Drug use: Not Currently    Frequency: 4.0 times per week    Types: "Crack" cocaine, Cocaine    Comment: Recent use of crack   Sexual activity: Yes    Comment: DECLINED CONDOMS  Other Topics Concern   Not on file  Social History Narrative   Currently staying with a friend in [place].  Was staying @ local motel until a few days ago - left b/c of bed bugs.   P[facility] up [facility].  Not followed by a psychiatrist.   Social Determinants of Health   Financial Resource Strain: Medium Risk (09/16/2018)   Overall Financial Resource Strain (CARDIA)    Difficulty of Paying Living Expenses: Somewhat hard  Food Insecurity: Food Insecurity Present (01/17/2023)   Hunger Vital Sign    Worried About Running Out of Food in the Last Year: Often true    Ran Out of Food in the Last Year: Often true  Transportation Needs: Unmet Transportation Needs (01/17/2023)   PRAPARE - Administrator, Civil Service (Medical): Yes    Lack of Transportation (Non-Medical): Yes  Physical Activity: Unknown (04/22/2019)   Exercise Vital Sign    Days of Exercise per Week: Not on file    Minutes of Exercise per Session: 0 min  Stress: Stress Concern Present (09/16/2018)   [employer] of Occupational Health - Occupational Stress Questionnaire    Feeling of Stress : Very much  Social Connections: Socially Isolated (09/16/2018)   Social Connection and Isolation Panel [NHANES]    Frequency of Communication with Friends and Family: Once a week    Frequency of Social Gatherings with Friends and Family: Never    Attends Religious Services: Never     Database administrator or Organizations: No    Attends Banker Meetings: Never    Marital Status: Widowed   Additional Social History:    Allergies:  No Known Allergies  Labs:  Results for orders placed or performed  during the hospital encounter of 01/27/23 (from the past 48 hour(s))  Rapid urine drug screen (hospital performed)     Status: Abnormal   Collection Time: 01/27/23  2:13 AM  Result Value Ref Range   Opiates NONE DETECTED NONE DETECTED   Cocaine POSITIVE (A) NONE DETECTED   Benzodiazepines NONE DETECTED NONE DETECTED   Amphetamines NONE DETECTED NONE DETECTED   Tetrahydrocannabinol NONE DETECTED NONE DETECTED   Barbiturates NONE DETECTED NONE DETECTED    Comment: (NOTE) DRUG SCREEN FOR MEDICAL PURPOSES ONLY.  IF CONFIRMATION IS NEEDED FOR ANY PURPOSE, NOTIFY LAB WITHIN 5 DAYS.  LOWEST DETECTABLE LIMITS FOR URINE DRUG SCREEN Drug Class                     Cutoff (ng/mL) Amphetamine and metabolites    1000 Barbiturate and metabolites    200 Benzodiazepine                 200 Opiates and metabolites        300 Cocaine and metabolites        300 THC                            50 Performed at [facility], [address] [place], [place] [address]   CBC     Status: Abnormal   Collection Time: 01/27/23  2:56 AM  Result Value Ref Range   WBC 4.8 4.0 - 10.5 K/uL   RBC 3.44 (L) 4.22 - 5.81 MIL/uL   Hemoglobin 9.8 (L) 13.0 - 17.0 g/dL   HCT 60.4 (L) 54.0 - 98.1 %   MCV 87.8 80.0 - 100.0 fL   MCH 28.5 26.0 - 34.0 pg   MCHC 32.5 30.0 - 36.0 g/dL   RDW 19.1 47.8 - 29.5 %   Platelets 160 150 - 400 K/uL   nRBC 0.0 0.0 - 0.2 %    Comment: Performed at [facility], [address], [place], [place] [address]  Troponin I (High Sensitivity)     Status: Abnormal   Collection Time: 01/27/23  2:56 AM  Result Value Ref Range   Troponin I (High Sensitivity) 25 (H) <18 ng/L    Comment: (NOTE) Elevated high sensitivity troponin I (hsTnI) values and  significant  changes across serial measurements may suggest ACS but many other  chronic and acute conditions are known to elevate hsTnI results.  Refer to the "Links" section for chest pain algorithms and additional  guidance. Performed at [facility], [address] [place], [place] [address]   Lipase, blood     Status: Abnormal   Collection Time: 01/27/23  2:56 AM  Result Value Ref Range   Lipase 86 (H) 11 - 51 U/L    Comment: Performed at [facility], [address], [place], [place] [address]  Comprehensive metabolic panel     Status: Abnormal   Collection Time: 01/27/23  2:56 AM  Result Value Ref Range   Sodium 139 135 - 145 mmol/L   Potassium 4.8 3.5 - 5.1 mmol/L   Chloride 106 98 - 111 mmol/L   CO2 20 (L) 22 - 32 mmol/L   Glucose, Bld 144 (H) 70 - 99 mg/dL    Comment: Glucose reference range applies only to samples taken after fasting for at least 8 hours.   BUN 59 (H) 8 - 23 mg/dL   Creatinine, Ser 9.52 (H)  0.61 - 1.24 mg/dL   Calcium 8.9 8.9 - 16.1 mg/dL   Total Protein 7.8 6.5 - 8.1 g/dL   Albumin 3.8 3.5 - 5.0 g/dL   AST 19 15 - 41 U/L   ALT 20 0 - 44 U/L   Alkaline Phosphatase 60 38 - 126 U/L   Total Bilirubin 0.4 0.3 - 1.2 mg/dL   GFR, Estimated 19 (L) >60 mL/min    Comment: (NOTE) Calculated using the CKD-EPI Creatinine Equation (2021)    Anion gap 13 5 - 15    Comment: Performed at [facility], [address] [place], [place] [address]  Ethanol     Status: None   Collection Time: 01/27/23  2:56 AM  Result Value Ref Range   Alcohol, Ethyl (B) <10 <10 mg/dL    Comment: (NOTE) Lowest detectable limit for serum alcohol is 10 mg/dL.  For medical purposes only. Performed at [facility], [address] [place], [place] [address]   Salicylate level     Status: Abnormal   Collection Time: 01/27/23  2:56 AM  Result Value Ref Range   Salicylate Lvl <7.0 (L) 7.0 - 30.0 mg/dL    Comment: Performed at [facility], [address] [place], [place] [address]  Acetaminophen level     Status: Abnormal   Collection Time: 01/27/23  2:56 AM  Result Value Ref Range   Acetaminophen (Tylenol), Serum <10 (L) 10 - 30 ug/mL    Comment: (NOTE) Therapeutic concentrations vary significantly. A range of 10-30 ug/mL  may be an effective concentration for many patients. However, some  are best treated at concentrations outside of this range. Acetaminophen concentrations >150 ug/mL at 4 hours after ingestion  and >50 ug/mL at 12 hours after ingestion are often associated with  toxic reactions.  Performed at [facility], [address] [place], [place] [address]   Troponin I (High Sensitivity)     Status: Abnormal   Collection Time: 01/27/23  4:44 AM  Result Value Ref Range   Troponin I (High Sensitivity) 25 (H) <18 ng/L    Comment: (NOTE) Elevated high sensitivity troponin I (hsTnI) values and significant  changes across serial measurements may suggest ACS but many other  chronic and acute conditions are known to elevate hsTnI results.  Refer to the "Links" section for chest pain algorithms and additional  guidance. Performed at [facility], [address] [place], [place] [address]     Current Facility-Administered Medications  Medication Dose Route Frequency Provider Last Rate Last Admin   albuterol (PROVENTIL) (2.5 MG/3ML) 0.083% nebulizer solution 2.5 mg  2.5 mg Nebulization Q4H PRN Sabas Sous, MD       aspirin chewable tablet 81 mg  81 mg Oral Daily Sabas Sous, MD   81 mg at 01/27/23 8657   atorvastatin (LIPITOR) tablet 40 mg  40 mg Oral Daily Sabas Sous, MD   40 mg at 01/27/23 8469   diltiazem (CARDIZEM CD) 24 hr capsule 180 mg  180 mg Oral Daily Sabas Sous, MD   180 mg at 01/27/23 6295   ferrous sulfate tablet 325 mg  325 mg Oral Q breakfast Sabas Sous, MD   325 mg at 01/27/23 0929   FLUoxetine (PROZAC) capsule 20 mg  20 mg Oral Daily Sabas Sous, MD   20 mg at 01/27/23  0927   fluticasone furoate-vilanterol (BREO ELLIPTA) 200-25 MCG/ACT 1 puff  1  puff Inhalation Daily Sabas Sous, MD       folic acid (FOLVITE) tablet 1 mg  1 mg Oral Daily Sabas Sous, MD   1 mg at 01/27/23 1610   hydrALAZINE (APRESOLINE) tablet 25 mg  25 mg Oral Q8H Sabas Sous, MD   25 mg at 01/27/23 9604   isosorbide mononitrate (IMDUR) 24 hr tablet 15 mg  15 mg Oral Daily Sabas Sous, MD   15 mg at 01/27/23 5409   multivitamin (RENA-VIT) tablet 1 tablet  1 tablet Oral QHS Sabas Sous, MD       nitroGLYCERIN (NITROSTAT) SL tablet 0.4 mg  0.4 mg Sublingual Q5 min PRN Sabas Sous, MD       pantoprazole (PROTONIX) EC tablet 40 mg  40 mg Oral Daily Sabas Sous, MD   40 mg at 01/27/23 0929   polyethylene glycol (MIRALAX / GLYCOLAX) packet 17 g  17 g Oral BID Sabas Sous, MD   17 g at 01/27/23 8119   QUEtiapine (SEROQUEL) tablet 50 mg  50 mg Oral QHS Sabas Sous, MD       sodium bicarbonate tablet 650 mg  650 mg Oral TID Sabas Sous, MD   650 mg at 01/27/23 1478   Current Outpatient Medications  Medication Sig Dispense Refill   albuterol (VENTOLIN HFA) 108 (90 Base) MCG/ACT inhaler Inhale 1-2 puffs into the lungs every 6 (six) hours as needed for wheezing or shortness of breath. (Patient not taking: Reported on 01/27/2023) 6.7 g 0   aspirin 81 MG chewable tablet Chew by mouth daily. (Patient not taking: Reported on 01/27/2023)     atorvastatin (LIPITOR) 40 MG tablet Take 1 tablet (40 mg total) by mouth daily. (Patient not taking: Reported on 01/27/2023) 30 tablet 3   b complex-vitamin c-folic acid (NEPHRO-VITE) 0.8 MG TABS tablet Take 1 tablet by mouth daily. (Patient not taking: Reported on 01/27/2023)     diltiazem (CARDIZEM CD) 180 MG 24 hr capsule Take 1 capsule (180 mg total) by mouth daily. (Patient not taking: Reported on 01/27/2023) 30 capsule 0   ferrous sulfate 325 (65 FE) MG tablet Take 1 tablet (325 mg total) by mouth daily with breakfast. (Patient not  taking: Reported on 01/27/2023) 30 tablet 3   FLUoxetine (PROZAC) 20 MG capsule Take 1 capsule (20 mg total) by mouth daily. (Patient not taking: Reported on 01/27/2023) 30 capsule 3   fluticasone furoate-vilanterol (BREO ELLIPTA) 200-25 MCG/ACT AEPB Inhale 1 puff into the lungs daily. (Patient not taking: Reported on 01/27/2023) 60 each 3   folic acid (FOLVITE) 1 MG tablet Take 1 tablet (1 mg total) by mouth daily. (Patient not taking: Reported on 01/27/2023) 30 tablet 0   hydrALAZINE (APRESOLINE) 25 MG tablet Take 1 tablet (25 mg total) by mouth every 8 (eight) hours. (Patient not taking: Reported on 01/27/2023) 90 tablet 0   isosorbide mononitrate (IMDUR) 30 MG 24 hr tablet Take 0.5 tablets (15 mg total) by mouth daily. (Patient not taking: Reported on 01/27/2023) 30 tablet 3   nitroGLYCERIN (NITROSTAT) 0.4 MG SL tablet Place 1 tablet (0.4 mg total) under the tongue every 5 (five) minutes as needed for chest pain. (Patient not taking: Reported on 01/27/2023) 25 tablet 0   pantoprazole (PROTONIX) 40 MG tablet Take 1 tablet (40 mg total) by mouth daily. (Patient not taking: Reported on 01/27/2023) 30 tablet 0   polyethylene glycol (MIRALAX / GLYCOLAX) 17 g packet Take 17 g by  mouth 2 (two) times daily. (Patient not taking: Reported on 01/27/2023) 14 each 0   QUEtiapine (SEROQUEL) 50 MG tablet Take 1 tablet (50 mg total) by mouth at bedtime. (Patient not taking: Reported on 01/27/2023) 30 tablet 3   sodium bicarbonate 650 MG tablet Take 1 tablet (650 mg total) by mouth 3 (three) times daily. (Patient not taking: Reported on 01/27/2023) 90 tablet 0   Psychiatric Specialty Exam: Presentation  General Appearance:  Appropriate for Environment  Eye Contact: Good  Speech: Clear and Coherent  Speech Volume: Normal  Handedness: Right   Mood and Affect  Mood: Euthymic  Affect: Appropriate   Thought Process  Thought Processes: Coherent  Descriptions of Associations:Intact  Orientation:Full  (Time, Place and Person)  Thought Content:WDL  History of Schizophrenia/Schizoaffective disorder:No  Duration of Psychotic Symptoms:No data recorded Hallucinations:Hallucinations: None  Ideas of Reference:None  Suicidal Thoughts:Suicidal Thoughts: Yes, Passive SI Active Intent and/or Plan: Without Intent; Without Plan  Homicidal Thoughts:Homicidal Thoughts: No   Sensorium  Memory: Immediate Fair; Recent Fair  Judgment: Fair  Insight: Fair   Art therapist  Concentration: Good  Attention Span: Good  Recall: Good  Fund of Knowledge: Good  Language: Good   Psychomotor Activity  Psychomotor Activity: Psychomotor Activity: Normal   Assets  Assets: Desire for Improvement; Leisure Time; Physical Health    Sleep  Sleep: Sleep: Fair   Physical Exam: Physical Exam Neurological:     Mental Status: He is alert and oriented to person, place, and time.  Psychiatric:        Attention and Perception: Attention normal.        Mood and Affect: Mood normal.        Speech: Speech normal.        Behavior: Behavior is cooperative.        Thought Content: Thought content includes suicidal ideation.    Review of Systems  Psychiatric/Behavioral:  Positive for substance abuse and suicidal ideas.   All other systems reviewed and are negative.  Blood pressure (!) 202/96, pulse 77, temperature 98.1 F (36.7 C), temperature source Oral, resp. rate 19, SpO2 100 %. There is no height or weight on file to calculate BMI.  Medical Decision Making: Patient case reviewed and discussed with Dr. Lucianne Muss. Patient frequently presents to ED/UC setting due to chronic SI due to homeless and substance abuse. Due to malingering behaviors and patient admitting to not following up with OP recommendations, I do not feel patient is not appropriate for inpatient psychiatric treatment. Pt is able to contract for safety at this time, he is understanding of the need to establish OP  follow up, seek substance abuse treatment, and follow up with DSS for housing/disability needs. Resources will be left in AVS. Pt is psychiatrically cleared.    Disposition: No evidence of imminent risk to self or others at present.   Patient does not meet criteria for psychiatric inpatient admission. Supportive therapy provided about ongoing stressors. Discussed crisis plan, support from social network, calling 911, coming to the Emergency Department, and calling Suicide Hotline.  Eligha Bridegroom, NP 01/27/2023 11:22 AM

## 2023-01-27 NOTE — ED Triage Notes (Signed)
Pt reports left sided chest pain, generalized abdominal pain, nausea, LBM 5 days ago. Reports he has been having SI thoughts, but today the thoughts are worse, he does not have a plan.

## 2023-01-28 ENCOUNTER — Emergency Department (HOSPITAL_COMMUNITY): Payer: 59

## 2023-01-28 ENCOUNTER — Encounter (HOSPITAL_COMMUNITY): Payer: Self-pay | Admitting: Emergency Medicine

## 2023-01-28 ENCOUNTER — Emergency Department (HOSPITAL_COMMUNITY)
Admission: EM | Admit: 2023-01-28 | Discharge: 2023-01-29 | Disposition: A | Discharge status: Home / Self Care | Payer: 59 | Attending: Emergency Medicine | Admitting: Emergency Medicine

## 2023-01-28 ENCOUNTER — Other Ambulatory Visit: Payer: Self-pay

## 2023-01-28 DIAGNOSIS — J45909 Unspecified asthma, uncomplicated: Secondary | ICD-10-CM | POA: Insufficient documentation

## 2023-01-28 DIAGNOSIS — E1122 Type 2 diabetes mellitus with diabetic chronic kidney disease: Secondary | ICD-10-CM | POA: Diagnosis not present

## 2023-01-28 DIAGNOSIS — Z951 Presence of aortocoronary bypass graft: Secondary | ICD-10-CM | POA: Insufficient documentation

## 2023-01-28 DIAGNOSIS — K5900 Constipation, unspecified: Secondary | ICD-10-CM | POA: Diagnosis not present

## 2023-01-28 DIAGNOSIS — I130 Hypertensive heart and chronic kidney disease with heart failure and stage 1 through stage 4 chronic kidney disease, or unspecified chronic kidney disease: Secondary | ICD-10-CM | POA: Insufficient documentation

## 2023-01-28 DIAGNOSIS — I2510 Atherosclerotic heart disease of native coronary artery without angina pectoris: Secondary | ICD-10-CM | POA: Insufficient documentation

## 2023-01-28 DIAGNOSIS — Z7982 Long term (current) use of aspirin: Secondary | ICD-10-CM | POA: Diagnosis not present

## 2023-01-28 DIAGNOSIS — Z79899 Other long term (current) drug therapy: Secondary | ICD-10-CM | POA: Insufficient documentation

## 2023-01-28 DIAGNOSIS — N184 Chronic kidney disease, stage 4 (severe): Secondary | ICD-10-CM | POA: Diagnosis not present

## 2023-01-28 DIAGNOSIS — I5030 Unspecified diastolic (congestive) heart failure: Secondary | ICD-10-CM | POA: Diagnosis not present

## 2023-01-28 DIAGNOSIS — G8929 Other chronic pain: Secondary | ICD-10-CM | POA: Insufficient documentation

## 2023-01-28 MED ORDER — POLYETHYLENE GLYCOL 3350 17 GM/SCOOP PO POWD
1.0000 | Freq: Once | ORAL | 0 refills | Status: AC
  Filled 2023-01-28: qty 255, 1d supply, fill #0

## 2023-01-28 MED ORDER — SODIUM CHLORIDE 0.9 % IV BOLUS: 1000.0000 mL | Freq: Once | INTRAVENOUS | Status: DC

## 2023-01-28 MED ORDER — FLEET ENEMA 7-19 GM/118ML RE ENEM
1.0000 | ENEMA | Freq: Once | RECTAL | Status: AC
  Administered 2023-01-28: 1 via RECTAL
  Filled 2023-01-28: qty 1

## 2023-01-28 NOTE — ED Triage Notes (Signed)
Pt c/o constipation for 7 days. Endorses generalized abdominal pain. Pt also endorses suicidal thoughts without plan. Denies HI.

## 2023-01-28 NOTE — Discharge Instructions (Addendum)
Please follow up with your primary care doctor within 2-3 days. For constipation we also recommend a diet high in fiber (beans, fruits, vegetables, whole grains). Take Colace 100-200 mg up to three times per day. You may take along with Senokot 1-2 tabs, ingest with full glass of water.  You may also take MiraLAX 1-2 capfuls 1-2 times a day until stools become soft and then slowly decrease the amount of MiraLAX used.  Maintain fluid intake 6-8 glasses per day. Please increase fibers in your diet. You may also take Milk of Magnesia 30 mL as needed for constipation, you may repeat in 2 hours again if no bowl movement. You may use fleet enema as needed to help with constipation as well.   It was a pleasure caring for you today in the emergency department.  Please return to the emergency department for any worsening or worrisome symptoms.

## 2023-01-28 NOTE — ED Notes (Signed)
Pt dressed in hospital scrubs and kept in triage.

## 2023-01-28 NOTE — ED Notes (Signed)
Patient transported to CT 

## 2023-01-28 NOTE — ED Notes (Signed)
Patient returned to room from CT. 

## 2023-01-28 NOTE — ED Provider Notes (Signed)
[place] [facility] AT [facility] Provider Note  CSN: [id number] Arrival date & time: 01/28/23 1846  Chief Complaint(s) Constipation and Suicidal  HPI Michael [name] is a 72 y.o. male with past medical history as below, significant for chronic psychiatric issues, CAD, chronic constipation, polysubstance abuse, HLD, HTN who presents to the ED with complaint of constipation.  Patient reports chronic constipation, he has not had his MiraLAX recently and he feels constipated.  Reports he felt nauseated few days ago but this has not subsided.  He is otherwise tolerant p.o. intake with difficulty.  No fevers or chills.  No dysuria or hematuria.  No melena or BRBPR.  Intermittent opiate use.  No significant abdominal pain at this time report that he feels bloated/full.  Past Medical History Past Medical History:  Diagnosis Date   A-fib    Anemia    Asthma    No PFTs, history of childhood asthma   CAD (coronary artery disease)    Cellulitis 04/2014   left facial   Cellulitis and abscess of toe of right foot 12/08/2019   Chondromalacia of medial femoral condyle    Left knee MRI 04/28/12: Chondromalacia of the medial femoral condyle with slight peripheral degeneration of the meniscocapsular junction of the medial meniscus; followed by sports medicine   Chronic kidney failure, stage 4 (severe)    Collagen vascular disease    Crack cocaine use    for 20+ years, has been enrolled in detox programs in the past   Depression    with history of hospitalization for suicidal ideation   Diabetes mellitus 2002   Diagnosed in 2002, started insulin in 2012   Gout    Headache(784.0)    CT head 08/2011: Periventricular and subcortical white matter hypodensities are most in keeping with chronic microangiopathic change   HIV infection 08/2011   Followed by Dr. Ninetta Lights   Hyperlipidemia    Hypertension    Pulmonary embolism    Patient Active Problem List   Diagnosis Date Noted    Acute pancreatitis 01/17/2023   AKI (acute kidney injury) 12/12/2022   Hyperkalemia 10/05/2022   CKD (chronic kidney disease), stage IV 09/23/2022   Polysubstance abuse 09/13/2022   ARF (acute renal failure) 09/09/2022   (HFpEF) heart failure with preserved ejection fraction 09/09/2022   Hypertensive urgency 12/09/2021   Impaired functional mobility, balance, and endurance 11/29/2021   Dilated cardiomyopathy 11/29/2021   Arthritis 11/29/2021   Diabetic foot infection 06/20/2021   Presence of aortocoronary bypass graft 04/16/2021   Iron deficiency anemia 04/04/2021   Atypical chest pain 04/03/2021   Anemia of chronic disease 12/14/2020   DMII (diabetes mellitus, type 2) 08/14/2020   Idiopathic chronic gout of multiple sites without tophus 02/17/2020   Diabetic ulcer of toe of right foot associated with type 2 diabetes mellitus 12/17/2019   Vitamin B12 deficiency 12/17/2019   Homelessness 12/13/2019   Cellulitis 12/07/2019   Osteomyelitis 07/17/2019   Abscess or cellulitis of toe, right    Toe pain, right-second    MDD (major depressive disorder), recurrent episode, severe 06/15/2019   Respiratory distress 04/12/2019   Pneumonia due to severe acute respiratory syndrome coronavirus 2 (SARS-CoV-2) 04/11/2019   COVID-19 virus infection 03/19/2019   Chest pain 10/03/2018   Malnutrition of moderate degree 09/21/2018   MDD (major depressive disorder), recurrent episode, moderate    Type II diabetes mellitus with renal manifestations 05/04/2018   GERD (gastroesophageal reflux disease) 05/04/2018   S/P CABG (coronary artery bypass  graft)    Left testicular pain    Depression 02/20/2018   Epididymo-orchitis, acute 02/20/2018   Essential hypertension 02/20/2018   Precordial chest pain 02/20/2018   Acute kidney injury superimposed on chronic kidney disease 02/14/2018   HCAP (healthcare-associated pneumonia) 02/14/2018   History of pulmonary embolism 07/04/2017   Acute hyponatremia  05/11/2017   Hyperglycemia due to type 2 diabetes mellitus 05/11/2017   CHF (congestive heart failure) 04/01/2017   Hepatitis C 12/30/2016   Substance induced mood disorder 11/23/2016   Chronic ischemic heart disease 11/12/2016   Paroxysmal A-fib 11/12/2016   Back pain 04/18/2016   S/P carotid endarterectomy 11/15/2015   COPD (chronic obstructive pulmonary disease) 10/22/2015   Stenosis of cervical spine with myelopathy 10/20/2015   MDD (major depressive disorder), recurrent severe, without psychosis 09/09/2015   History of fusion of cervical spine 08/28/2015   Cocaine abuse with cocaine-induced mood disorder 08/14/2015   Gout 07/10/2015   Acute renal failure superimposed on stage 3 chronic kidney disease ([name]) 03/06/2015   Chronic kidney disease, stage III (moderate) 03/06/2015   Encounter for general adult medical examination with abnormal findings 02/09/2015   Major depressive disorder, recurrent, severe without psychotic features    Suicidal ideations 08/15/2014   Cervicalgia 06/28/2014   Lumbar radiculopathy, chronic 06/28/2014   Asthma, chronic 02/03/2014   S/P percutaneous transluminal coronary angioplasty 10/15/2013   3-vessel CAD 06/24/2013   ED (erectile dysfunction) of organic origin 07/07/2012   Hypertension goal BP (blood pressure) < 140/80 04/29/2012   Chondromalacia of left knee 03/19/2012   Hyperlipidemia with target LDL less than 100 02/12/2012   Fibromyalgia 02/12/2012   Pure hypercholesterolemia 02/12/2012   Cocaine use disorder 01/10/2012    Class: Acute   Human immunodeficiency virus (HIV) disease 12/16/2011   HIV (human immunodeficiency virus infection) 08/27/2011   Uncontrolled type 2 diabetes with neuropathy 10/17/2000   Home Medication(s) Prior to Admission medications   Medication Sig Start Date End Date Taking? Authorizing Provider  polyethylene glycol powder (GLYCOLAX/MIRALAX) 17 GM/SCOOP powder Take 255 g by mouth once for 1 dose. 01/29/23 01/29/23  Yes Tanda Rockers A, DO  albuterol (VENTOLIN HFA) 108 (90 Base) MCG/ACT inhaler Inhale 1-2 puffs into the lungs every 6 (six) hours as needed for wheezing or shortness of breath. Patient not taking: Reported on 01/27/2023 10/24/22   [place], Eric J, DO  aspirin 81 MG chewable tablet Chew by mouth daily. Patient not taking: Reported on 01/27/2023    [provider]  atorvastatin (LIPITOR) 40 MG tablet Take 1 tablet (40 mg total) by mouth daily. Patient not taking: Reported on 01/27/2023 12/09/22 04/08/23  Sarina Ill, DO  b complex-vitamin c-folic acid (NEPHRO-VITE) 0.8 MG TABS tablet Take 1 tablet by mouth daily. Patient not taking: Reported on 01/27/2023    [provider]  diltiazem (CARDIZEM CD) 180 MG 24 hr capsule Take 1 capsule (180 mg total) by mouth daily. Patient not taking: Reported on 01/27/2023 01/26/23 02/25/23  Jerald Kief, MD  ferrous sulfate 325 (65 FE) MG tablet Take 1 tablet (325 mg total) by mouth daily with breakfast. Patient not taking: Reported on 01/27/2023 12/17/22   Regalado, Jon Billings A, MD  FLUoxetine (PROZAC) 20 MG capsule Take 1 capsule (20 mg total) by mouth daily. Patient not taking: Reported on 01/27/2023 12/17/22   Regalado, Jon Billings A, MD  fluticasone furoate-vilanterol (BREO ELLIPTA) 200-25 MCG/ACT AEPB Inhale 1 puff into the lungs daily. Patient not taking: Reported on 01/27/2023 12/10/22   Sarina Ill,  DO  folic acid (FOLVITE) 1 MG tablet Take 1 tablet (1 mg total) by mouth daily. Patient not taking: Reported on 01/27/2023 12/17/22   Hartley Barefoot A, MD  hydrALAZINE (APRESOLINE) 25 MG tablet Take 1 tablet (25 mg total) by mouth every 8 (eight) hours. Patient not taking: Reported on 01/27/2023 01/25/23 02/24/23  Jerald Kief, MD  isosorbide mononitrate (IMDUR) 30 MG 24 hr tablet Take 0.5 tablets (15 mg total) by mouth daily. Patient not taking: Reported on 01/27/2023 12/10/22   Sarina Ill, DO  nitroGLYCERIN (NITROSTAT) 0.4 MG  SL tablet Place 1 tablet (0.4 mg total) under the tongue every 5 (five) minutes as needed for chest pain. Patient not taking: Reported on 01/27/2023 12/09/22   Sarina Ill, DO  pantoprazole (PROTONIX) 40 MG tablet Take 1 tablet (40 mg total) by mouth daily. Patient not taking: Reported on 01/27/2023 12/17/22   Regalado, Jon Billings A, MD  polyethylene glycol (MIRALAX / GLYCOLAX) 17 g packet Take 17 g by mouth 2 (two) times daily. Patient not taking: Reported on 01/27/2023 12/17/22   Regalado, Jon Billings A, MD  QUEtiapine (SEROQUEL) 50 MG tablet Take 1 tablet (50 mg total) by mouth at bedtime. Patient not taking: Reported on 01/27/2023 12/09/22   Sarina Ill, DO  sodium bicarbonate 650 MG tablet Take 1 tablet (650 mg total) by mouth 3 (three) times daily. Patient not taking: Reported on 01/27/2023 12/16/22   Alba Cory, MD                                                                                                                                    Past Surgical History Past Surgical History:  Procedure Laterality Date   AMPUTATION Right 07/21/2019   Procedure: RIGHT SECOND TOE AMPUTATION;  Surgeon: Nadara Mustard, MD;  Location: [facility] OR;  Service: Orthopedics;  Laterality: Right;   BACK SURGERY     1988   BOWEL RESECTION     CARDIAC SURGERY     CERVICAL SPINE SURGERY     " rods in my neck "   CORONARY ARTERY BYPASS GRAFT     CORONARY STENT PLACEMENT     NM MYOCAR PERF WALL MOTION  12/27/2011   normal   SPINE SURGERY     Family History Family History  Problem Relation Age of Onset   Diabetes Mother    Hypertension Mother    Hyperlipidemia Mother    Diabetes Father    Cancer Father    Hypertension Father    Diabetes Brother    Heart disease Brother    Diabetes Sister    Colon cancer Neg Hx     Social History Social History   Tobacco Use   Smoking status: Never   Smokeless tobacco: Never  Vaping Use   Vaping Use: Never used  Substance Use Topics   Alcohol  use: No    Alcohol/week: 4.0 standard drinks  of alcohol    Types: 2 Cans of beer, 2 Shots of liquor per week   Drug use: Not Currently    Frequency: 4.0 times per week    Types: "Crack" cocaine, Cocaine    Comment: Recent use of crack   Allergies Patient has no known allergies.  Review of Systems Review of Systems  Constitutional:  Negative for chills and fever.  HENT:  Negative for facial swelling and trouble swallowing.   Eyes:  Negative for photophobia and visual disturbance.  Respiratory:  Negative for cough and shortness of breath.   Cardiovascular:  Negative for chest pain and palpitations.  Gastrointestinal:  Positive for constipation and nausea. Negative for abdominal pain and vomiting.  Endocrine: Negative for polydipsia and polyuria.  Genitourinary:  Negative for difficulty urinating and hematuria.  Musculoskeletal:  Negative for gait problem and joint swelling.  Skin:  Negative for pallor and rash.  Neurological:  Negative for syncope and headaches.  Psychiatric/Behavioral:  Negative for agitation and confusion.     Physical Exam Vital Signs  I have reviewed the triage vital signs BP (!) 162/95   Pulse 91   Temp 98.1 F (36.7 C) (Oral)   Resp 18   Ht 5\' 8"  (1.727 m)   Wt 69.8 kg   SpO2 100%   BMI 23.40 kg/m  Physical Exam Vitals and nursing note reviewed.  Constitutional:      General: He is not in acute distress.    Appearance: He is well-developed.  HENT:     Head: Normocephalic and atraumatic.     Right Ear: External ear normal.     Left Ear: External ear normal.     Mouth/Throat:     Mouth: Mucous membranes are moist.  Eyes:     General: No scleral icterus. Cardiovascular:     Rate and Rhythm: Normal rate and regular rhythm.     Pulses: Normal pulses.     Heart sounds: Normal heart sounds.  Pulmonary:     Effort: Pulmonary effort is normal. No respiratory distress.     Breath sounds: Normal breath sounds.  Abdominal:     General: Abdomen is  flat.     Palpations: Abdomen is soft.     Tenderness: There is no abdominal tenderness.  Musculoskeletal:        General: Normal range of motion.     Right lower leg: No edema.     Left lower leg: No edema.  Skin:    General: Skin is warm and dry.     Capillary Refill: Capillary refill takes less than 2 seconds.  Neurological:     Mental Status: He is alert and oriented to person, place, and time.  Psychiatric:        Mood and Affect: Mood normal.        Behavior: Behavior normal.     ED Results and Treatments Labs (all labs ordered are listed, but only abnormal results are displayed) Labs Reviewed - No data to display  Radiology CT ABDOMEN PELVIS WO CONTRAST  Result Date: 01/28/2023 CLINICAL DATA:  Acute abdominal pain, constipation, nausea EXAM: CT ABDOMEN AND PELVIS WITHOUT CONTRAST TECHNIQUE: Multidetector CT imaging of the abdomen and pelvis was performed following the standard protocol without IV contrast. Unenhanced CT was performed per clinician order. Lack of IV contrast limits sensitivity and specificity, especially for evaluation of abdominal/pelvic solid viscera. RADIATION DOSE REDUCTION: This exam was performed according to the departmental dose-optimization program which includes automated exposure control, adjustment of the mA and/or kV according to patient size and/or use of iterative reconstruction technique. COMPARISON:  01/17/2023 FINDINGS: Lower chest: No acute pleural or parenchymal lung disease. Stable bibasilar scarring. Hepatobiliary: Unremarkable unenhanced appearance of the liver and gallbladder. Pancreas: Unremarkable unenhanced appearance. Spleen: Unremarkable unenhanced appearance. Adrenals/Urinary Tract: No urinary tract calculi or obstructive uropathy. The adrenals and bladder are unremarkable. Stomach/Bowel: No bowel obstruction or ileus.  Moderate stool throughout the colon, compatible with constipation. Large amount of retained stool within the rectal vault could reflect superimposed fecal impaction. No bowel wall thickening or inflammatory change. Vascular/Lymphatic: Aortic atherosclerosis. No enlarged abdominal or pelvic lymph nodes. Reproductive: Prostate is unremarkable. Other: No free fluid or free intraperitoneal gas. Small fat containing umbilical hernia. No bowel herniation. Musculoskeletal: No acute or destructive bony lesions. Postsurgical changes at the lumbosacral junction. Reconstructed images demonstrate no additional findings. IMPRESSION: 1. Constipation, with a large amount of retained stool in the rectal vault consistent with superimposed fecal impaction. 2.  Aortic Atherosclerosis (ICD10-I70.0). Electronically Signed   By: Sharlet Salina M.D.   On: 01/28/2023 22:56    Pertinent labs & imaging results that were available during my care of the patient were reviewed by me and considered in my medical decision making (see MDM for details).  Medications Ordered in ED Medications  sodium phosphate (FLEET) 7-19 GM/118ML enema 1 enema (1 enema Rectal Given 01/28/23 2350)                                                                                                                                     Procedures Procedures  (including critical care time)  Medical Decision Making / ED Course    Medical Decision Making:    Trelon Plush is a 72 y.o. male with past medical history as below, significant for chronic psychiatric issues, CAD, chronic constipation, polysubstance abuse, HLD, HTN who presents to the ED with complaint of constipation.. The complaint involves an extensive differential diagnosis and also carries with it a high risk of complications and morbidity.  Serious etiology was considered. Ddx includes but is not limited to: Differential diagnosis includes but is not exclusive to acute appendicitis, renal  colic, testicular torsion, urinary tract infection, prostatitis,  diverticulitis, small bowel obstruction, colitis, abdominal aortic aneurysm, gastroenteritis, constipation etc.   Complete initial physical exam performed, notably the patient  was no acute distress, resting comfortably, breathing comfortably on ambient air.  Reviewed and confirmed nursing documentation for past medical history, family history, social history.  Vital signs reviewed.    Clinical Course as of 01/29/23 0019  Tue Jan 28, 2023  2346 Discussed imaging findings with patient, he reports he is chronically constipated and has not had his miralax in some time now. Discussed treatment of constipation, he prefers to trial oral regimen at home and trial enema rather than disimpaction in th ED. Give constipation instructions for home and follow up precautions.  [SG]    Clinical Course User Index [SG] Sloan Leiter, DO    He is tolerating p.o., no nausea or vomiting last 24 hours.   The patient improved significantly and was discharged in stable condition. Detailed discussions were had with the patient regarding current findings, and need for close f/u with PCP or on call doctor. The patient has been instructed to return immediately if the symptoms worsen in any way for re-evaluation. Patient verbalized understanding and is in agreement with current care plan. All questions answered prior to discharge.      Additional history obtained: -Additional history obtained from na -External records from outside source obtained and reviewed including: Chart review including previous notes, labs, imaging, consultation notes including prior ED visits, prior psychiatry notes, home medications, prior labs and imaging Reviewed labs from yesterday Reviewed discharge documentation from yesterday as well   Lab Tests: na  EKG   EKG Interpretation  Date/Time:    Ventricular Rate:    PR Interval:    QRS Duration:   QT  Interval:    QTC Calculation:   R Axis:     Text Interpretation:           Imaging Studies ordered: I ordered imaging studies including CT abdomen I independently visualized the following imaging with scope of interpretation limited to determining acute life threatening conditions related to emergency care; findings noted above, significant for aspiration I independently visualized and interpreted imaging. I agree with the radiologist interpretation   Medicines ordered and prescription drug management: Meds ordered this encounter  Medications   DISCONTD: sodium chloride 0.9 % bolus 1,000 mL   sodium phosphate (FLEET) 7-19 GM/118ML enema 1 enema   polyethylene glycol powder (GLYCOLAX/MIRALAX) 17 GM/SCOOP powder    Sig: Take 255 g by mouth once for 1 dose.    Dispense:  255 g    Refill:  0    -I have reviewed the patients home medicines and have made adjustments as needed   Consultations Obtained: na   Cardiac Monitoring: na  Social Determinants of Health:  Diagnosis or treatment significantly limited by social determinants of health: homelessness   Reevaluation: After the interventions noted above, I reevaluated the patient and found that they have improved  Co morbidities that complicate the patient evaluation  Past Medical History:  Diagnosis Date   A-fib    Anemia    Asthma    No PFTs, history of childhood asthma   CAD (coronary artery disease)    Cellulitis 04/2014   left facial   Cellulitis and abscess of toe of right foot 12/08/2019   Chondromalacia of medial femoral condyle    Left knee MRI 04/28/12: Chondromalacia of the medial femoral condyle with slight peripheral degeneration of the meniscocapsular junction of the medial meniscus; followed by sports medicine   Chronic kidney failure, stage 4 (severe)    Collagen vascular disease    Crack cocaine use    for 20+ years, has been enrolled in detox programs  in the past   Depression    with history of  hospitalization for suicidal ideation   Diabetes mellitus 2002   Diagnosed in 2002, started insulin in 2012   Gout    Headache(784.0)    CT head 08/2011: Periventricular and subcortical white matter hypodensities are most in keeping with chronic microangiopathic change   HIV infection 08/2011   Followed by Dr. Ninetta Lights   Hyperlipidemia    Hypertension    Pulmonary embolism       Dispostion: Disposition decision including need for hospitalization was considered, and patient discharged from emergency department.    Final Clinical Impression(s) / ED Diagnoses Final diagnoses:  Constipation, unspecified constipation type     This chart was dictated using voice recognition software.  Despite best efforts to proofread,  errors can occur which can change the documentation meaning.    Sloan Leiter, DO 01/29/23 [phone]

## 2023-01-28 NOTE — ED Provider Triage Note (Signed)
Emergency Medicine Provider Triage Evaluation Note  Michael [name] , a 72 y.o. male  was evaluated in triage.  Pt complains of constipation, dc from [facility] hold yesterday, states he asked for meds yesterday but didn't get anything. Last bowel movement 1 week ago.  Review of Systems  Positive:  Negative:   Physical Exam  BP (!) 177/89 (BP Location: Right Arm)   Pulse 96   Temp 97.9 F (36.6 C)   Resp 18   SpO2 100%  Gen:   Awake, no distress   Resp:  Normal effort  MSK:   Moves extremities without difficulty  Other:    Medical Decision Making  Medically screening exam initiated at 7:40 PM.  Appropriate orders placed.  [name] was informed that the remainder of the evaluation will be completed by another provider, this initial triage assessment does not replace that evaluation, and the importance of remaining in the ED until their evaluation is complete.     Jeannie Fend, PA-C 01/28/23 1945

## 2023-01-29 ENCOUNTER — Other Ambulatory Visit (HOSPITAL_COMMUNITY): Payer: Self-pay

## 2023-01-29 ENCOUNTER — Encounter (HOSPITAL_COMMUNITY): Payer: Self-pay | Admitting: Emergency Medicine

## 2023-01-29 ENCOUNTER — Emergency Department (HOSPITAL_COMMUNITY)
Admission: EM | Admit: 2023-01-29 | Discharge: 2023-01-29 | Disposition: A | Discharge status: Home / Self Care | Payer: 59 | Source: Home / Self Care | Attending: Emergency Medicine | Admitting: Emergency Medicine

## 2023-01-29 DIAGNOSIS — G8929 Other chronic pain: Secondary | ICD-10-CM | POA: Insufficient documentation

## 2023-01-29 DIAGNOSIS — Z7982 Long term (current) use of aspirin: Secondary | ICD-10-CM | POA: Insufficient documentation

## 2023-01-29 DIAGNOSIS — K5900 Constipation, unspecified: Secondary | ICD-10-CM | POA: Insufficient documentation

## 2023-01-29 MED ORDER — ONDANSETRON 4 MG PO TBDP
4.0000 mg | ORAL_TABLET | Freq: Once | ORAL | Status: AC
  Administered 2023-01-29: 4 mg via ORAL
  Filled 2023-01-29: qty 1

## 2023-01-29 MED ORDER — SORBITOL 70 % SOLN: 960.0000 mL | TOPICAL_OIL | Freq: Once | ORAL | Status: DC

## 2023-01-29 NOTE — Discharge Instructions (Signed)
1.  Make an appointment to see your doctor for recheck within the next 2 to 4 days. 2.  Continue taking your MiraLAX at home.

## 2023-01-29 NOTE — ED Triage Notes (Signed)
Pt arriving via EMS from [facility] for vomiting. Pt seen yesterday for constipation. Pt took Mirilax for the constipation earlier today and threw up about 45 minutes ago.  Zofran given by EMS. Pt has chronic chest pain (hx CABG) and abdominal pain x3 months.

## 2023-01-29 NOTE — ED Notes (Signed)
Pt A&OX4 ambulatory at d/c with independent steady gait. Pt verbalized understanding of d/c instructions and follow up care. 

## 2023-01-29 NOTE — ED Provider Notes (Signed)
Michael [name] Provider Note   CSN: [id number] Arrival date & time: 01/29/23  1906     History  Chief Complaint  Patient presents with   Emesis    Michael [name] is a 72 y.o. male.  HPI Patient reports he still has all the same symptoms.  He has generalized abdominal pain.  Patient reports he became nauseated and vomited about 3 hours ago.  He reports he tried taking MiraLAX constipation but did not hold it down.  No fever.  No pain burning urgency with urination.    Home Medications Prior to Admission medications   Medication Sig Start Date End Date Taking? Authorizing Provider  albuterol (VENTOLIN HFA) 108 (90 Base) MCG/ACT inhaler Inhale 1-2 puffs into the lungs every 6 (six) hours as needed for wheezing or shortness of breath. Patient not taking: Reported on 01/27/2023 10/24/22   [place], Eric J, DO  aspirin 81 MG chewable tablet Chew by mouth daily. Patient not taking: Reported on 01/27/2023    [provider]  atorvastatin (LIPITOR) 40 MG tablet Take 1 tablet (40 mg total) by mouth daily. Patient not taking: Reported on 01/27/2023 12/09/22 04/08/23  Sarina Ill, DO  b complex-vitamin c-folic acid (NEPHRO-VITE) 0.8 MG TABS tablet Take 1 tablet by mouth daily. Patient not taking: Reported on 01/27/2023    [provider]  diltiazem (CARDIZEM CD) 180 MG 24 hr capsule Take 1 capsule (180 mg total) by mouth daily. Patient not taking: Reported on 01/27/2023 01/26/23 02/25/23  Jerald Kief, MD  ferrous sulfate 325 (65 FE) MG tablet Take 1 tablet (325 mg total) by mouth daily with breakfast. Patient not taking: Reported on 01/27/2023 12/17/22   Regalado, Jon Billings A, MD  FLUoxetine (PROZAC) 20 MG capsule Take 1 capsule (20 mg total) by mouth daily. Patient not taking: Reported on 01/27/2023 12/17/22   Regalado, Jon Billings A, MD  fluticasone furoate-vilanterol (BREO ELLIPTA) 200-25 MCG/ACT AEPB Inhale 1 puff into the lungs  daily. Patient not taking: Reported on 01/27/2023 12/10/22   Sarina Ill, DO  folic acid (FOLVITE) 1 MG tablet Take 1 tablet (1 mg total) by mouth daily. Patient not taking: Reported on 01/27/2023 12/17/22   Hartley Barefoot A, MD  hydrALAZINE (APRESOLINE) 25 MG tablet Take 1 tablet (25 mg total) by mouth every 8 (eight) hours. Patient not taking: Reported on 01/27/2023 01/25/23 02/24/23  Jerald Kief, MD  isosorbide mononitrate (IMDUR) 30 MG 24 hr tablet Take 0.5 tablets (15 mg total) by mouth daily. Patient not taking: Reported on 01/27/2023 12/10/22   Sarina Ill, DO  nitroGLYCERIN (NITROSTAT) 0.4 MG SL tablet Place 1 tablet (0.4 mg total) under the tongue every 5 (five) minutes as needed for chest pain. Patient not taking: Reported on 01/27/2023 12/09/22   Sarina Ill, DO  pantoprazole (PROTONIX) 40 MG tablet Take 1 tablet (40 mg total) by mouth daily. Patient not taking: Reported on 01/27/2023 12/17/22   Regalado, Jon Billings A, MD  polyethylene glycol (MIRALAX / GLYCOLAX) 17 g packet Take 17 g by mouth 2 (two) times daily. Patient not taking: Reported on 01/27/2023 12/17/22   Regalado, Jon Billings A, MD  polyethylene glycol powder (GLYCOLAX/MIRALAX) 17 GM/SCOOP powder Take 255 g by mouth once for 1 dose. 01/29/23 01/30/23  Sloan Leiter, DO  QUEtiapine (SEROQUEL) 50 MG tablet Take 1 tablet (50 mg total) by mouth at bedtime. Patient not taking: Reported on 01/27/2023 12/09/22   Sarina Ill, DO  sodium bicarbonate 650 MG tablet Take 1 tablet (650 mg total) by mouth 3 (three) times daily. Patient not taking: Reported on 01/27/2023 12/16/22   Alba Cory, MD      Allergies    Patient has no known allergies.    Review of Systems   Review of Systems  Physical Exam Updated Vital Signs BP (!) 160/93 (BP Location: Left Arm)   Pulse 80   Temp 98.5 F (36.9 C) (Oral)   Resp 17   SpO2 100%  Physical Exam Constitutional:      Comments: Patient is resting quietly  as I approached the stretcher.  No respiratory distress.  HENT:     Mouth/Throat:     Pharynx: Oropharynx is clear.  Eyes:     Extraocular Movements: Extraocular movements intact.  Cardiovascular:     Rate and Rhythm: Normal rate and regular rhythm.  Pulmonary:     Effort: Pulmonary effort is normal.     Breath sounds: Normal breath sounds.  Abdominal:     Comments: Abdomen soft.  Patient Dors is tenderness diffusely.  No guarding.  Musculoskeletal:        General: No swelling. Normal range of motion.     Right lower leg: No edema.     Left lower leg: No edema.  Skin:    General: Skin is warm and dry.  Neurological:     General: No focal deficit present.     Mental Status: He is oriented to person, place, and time.     ED Results / Procedures / Treatments   Labs (all labs ordered are listed, but only abnormal results are displayed) Labs Reviewed - No data to display  EKG None  Radiology CT ABDOMEN PELVIS WO CONTRAST  Result Date: 01/28/2023 CLINICAL DATA:  Acute abdominal pain, constipation, nausea EXAM: CT ABDOMEN AND PELVIS WITHOUT CONTRAST TECHNIQUE: Multidetector CT imaging of the abdomen and pelvis was performed following the standard protocol without IV contrast. Unenhanced CT was performed per clinician order. Lack of IV contrast limits sensitivity and specificity, especially for evaluation of abdominal/pelvic solid viscera. RADIATION DOSE REDUCTION: This exam was performed according to the departmental dose-optimization program which includes automated exposure control, adjustment of the mA and/or kV according to patient size and/or use of iterative reconstruction technique. COMPARISON:  01/17/2023 FINDINGS: Lower chest: No acute pleural or parenchymal lung disease. Stable bibasilar scarring. Hepatobiliary: Unremarkable unenhanced appearance of the liver and gallbladder. Pancreas: Unremarkable unenhanced appearance. Spleen: Unremarkable unenhanced appearance.  Adrenals/Urinary Tract: No urinary tract calculi or obstructive uropathy. The adrenals and bladder are unremarkable. Stomach/Bowel: No bowel obstruction or ileus. Moderate stool throughout the colon, compatible with constipation. Large amount of retained stool within the rectal vault could reflect superimposed fecal impaction. No bowel wall thickening or inflammatory change. Vascular/Lymphatic: Aortic atherosclerosis. No enlarged abdominal or pelvic lymph nodes. Reproductive: Prostate is unremarkable. Other: No free fluid or free intraperitoneal gas. Small fat containing umbilical hernia. No bowel herniation. Musculoskeletal: No acute or destructive bony lesions. Postsurgical changes at the lumbosacral junction. Reconstructed images demonstrate no additional findings. IMPRESSION: 1. Constipation, with a large amount of retained stool in the rectal vault consistent with superimposed fecal impaction. 2.  Aortic Atherosclerosis (ICD10-I70.0). Electronically Signed   By: Sharlet Salina M.D.   On: 01/28/2023 22:56    Procedures Procedures    Medications Ordered in ED Medications  ondansetron (ZOFRAN-ODT) disintegrating tablet 4 mg (4 mg Oral Given 01/29/23 2134)    ED Course/ Medical Decision Making/ A&P  Medical Decision Making Risk Prescription drug management.   Patient has history of chronic abdominal pain.  He has had multiple evaluations.  Patient has had 6 CT scans of the abdomen this year (within less than 4 months).  CT scan was yesterday.  CT scan interpreted for constipation with fecal impaction.  Yesterday patient declined disimpaction and was going to try MiraLAX.  Today patient reports that he is agreeable to trying enema.  Patient is nontoxic.  Abdominal exam is nonsurgical.  At this time I do not think repeat imaging is indicated.  Will try enema.  Patient rechecked after enema.  He feels much better.  Patient denies having pain at this time.  He reports he  is hungry and he is fixing up a sandwich at bedside.  He is comfortable in appearance.  At this time stable for discharge.  Patient reports he will continue to take his MiraLAX at home and feels well this time.        Final Clinical Impression(s) / ED Diagnoses Final diagnoses:  Constipation, unspecified constipation type  Chronic abdominal pain    Rx / DC Orders ED Discharge Orders     None         Arby Barrette, MD 01/29/23 2302

## 2023-01-30 MED ORDER — PROPOFOL 500 MG/50ML IV EMUL
INTRAVENOUS | Status: AC
  Filled 2023-01-30: qty 50

## 2023-02-02 ENCOUNTER — Emergency Department (HOSPITAL_COMMUNITY): Payer: 59

## 2023-02-02 ENCOUNTER — Emergency Department (HOSPITAL_COMMUNITY)
Admission: EM | Admit: 2023-02-02 | Discharge: 2023-02-02 | Disposition: A | Discharge status: Home / Self Care | Payer: 59 | Attending: Emergency Medicine | Admitting: Emergency Medicine

## 2023-02-02 ENCOUNTER — Other Ambulatory Visit: Payer: Self-pay

## 2023-02-02 ENCOUNTER — Encounter (HOSPITAL_COMMUNITY): Payer: Self-pay

## 2023-02-02 ENCOUNTER — Emergency Department (HOSPITAL_BASED_OUTPATIENT_CLINIC_OR_DEPARTMENT_OTHER): Payer: 59

## 2023-02-02 DIAGNOSIS — D649 Anemia, unspecified: Secondary | ICD-10-CM | POA: Diagnosis not present

## 2023-02-02 DIAGNOSIS — E1122 Type 2 diabetes mellitus with diabetic chronic kidney disease: Secondary | ICD-10-CM | POA: Diagnosis not present

## 2023-02-02 DIAGNOSIS — R079 Chest pain, unspecified: Secondary | ICD-10-CM

## 2023-02-02 DIAGNOSIS — R609 Edema, unspecified: Secondary | ICD-10-CM | POA: Diagnosis not present

## 2023-02-02 DIAGNOSIS — N189 Chronic kidney disease, unspecified: Secondary | ICD-10-CM | POA: Insufficient documentation

## 2023-02-02 DIAGNOSIS — Z79899 Other long term (current) drug therapy: Secondary | ICD-10-CM | POA: Insufficient documentation

## 2023-02-02 DIAGNOSIS — J449 Chronic obstructive pulmonary disease, unspecified: Secondary | ICD-10-CM | POA: Insufficient documentation

## 2023-02-02 DIAGNOSIS — R7989 Other specified abnormal findings of blood chemistry: Secondary | ICD-10-CM | POA: Insufficient documentation

## 2023-02-02 DIAGNOSIS — Z21 Asymptomatic human immunodeficiency virus [HIV] infection status: Secondary | ICD-10-CM | POA: Insufficient documentation

## 2023-02-02 DIAGNOSIS — Z91148 Patient's other noncompliance with medication regimen for other reason: Secondary | ICD-10-CM | POA: Insufficient documentation

## 2023-02-02 DIAGNOSIS — R0789 Other chest pain: Secondary | ICD-10-CM | POA: Diagnosis present

## 2023-02-02 DIAGNOSIS — E1165 Type 2 diabetes mellitus with hyperglycemia: Secondary | ICD-10-CM | POA: Diagnosis not present

## 2023-02-02 DIAGNOSIS — Z951 Presence of aortocoronary bypass graft: Secondary | ICD-10-CM | POA: Insufficient documentation

## 2023-02-02 DIAGNOSIS — R030 Elevated blood-pressure reading, without diagnosis of hypertension: Secondary | ICD-10-CM | POA: Diagnosis not present

## 2023-02-02 DIAGNOSIS — R600 Localized edema: Secondary | ICD-10-CM | POA: Insufficient documentation

## 2023-02-02 LAB — COMPREHENSIVE METABOLIC PANEL
ALT: 95 U/L — ABNORMAL HIGH (ref 0–44)
AST: 60 U/L — ABNORMAL HIGH (ref 15–41)
Albumin: 2.9 g/dL — ABNORMAL LOW (ref 3.5–5.0)
Alkaline Phosphatase: 85 U/L (ref 38–126)
Anion gap: 8 (ref 5–15)
BUN: 55 mg/dL — ABNORMAL HIGH (ref 8–23)
CO2: 20 mmol/L — ABNORMAL LOW (ref 22–32)
Calcium: 8 mg/dL — ABNORMAL LOW (ref 8.9–10.3)
Chloride: 113 mmol/L — ABNORMAL HIGH (ref 98–111)
Creatinine, Ser: 3.29 mg/dL — ABNORMAL HIGH (ref 0.61–1.24)
GFR, Estimated: 19 mL/min — ABNORMAL LOW (ref >60)
Glucose, Bld: 111 mg/dL — ABNORMAL HIGH (ref 70–99)
Potassium: 4.6 mmol/L (ref 3.5–5.1)
Sodium: 141 mmol/L (ref 135–145)
Total Bilirubin: 0.6 mg/dL (ref 0.3–1.2)
Total Protein: 5.8 g/dL — ABNORMAL LOW (ref 6.5–8.1)

## 2023-02-02 LAB — CBC WITH DIFFERENTIAL/PLATELET
Abs Immature Granulocytes: 0.04 10*3/uL (ref 0.00–0.07)
Basophils Absolute: 0 10*3/uL (ref 0.0–0.1)
Basophils Relative: 0 %
Eosinophils Absolute: 0.1 10*3/uL (ref 0.0–0.5)
Eosinophils Relative: 3 %
HCT: 21.3 % — ABNORMAL LOW (ref 39.0–52.0)
Hemoglobin: 7 g/dL — ABNORMAL LOW (ref 13.0–17.0)
Immature Granulocytes: 1 %
Lymphocytes Relative: 28 %
Lymphs Abs: 0.9 10*3/uL (ref 0.7–4.0)
MCH: 29.4 pg (ref 26.0–34.0)
MCHC: 32.9 g/dL (ref 30.0–36.0)
MCV: 89.5 fL (ref 80.0–100.0)
Monocytes Absolute: 0.3 10*3/uL (ref 0.1–1.0)
Monocytes Relative: 10 %
Neutro Abs: 1.9 10*3/uL (ref 1.7–7.7)
Neutrophils Relative %: 58 %
Platelets: 116 10*3/uL — ABNORMAL LOW (ref 150–400)
RBC: 2.38 MIL/uL — ABNORMAL LOW (ref 4.22–5.81)
RDW: 13.4 % (ref 11.5–15.5)
WBC: 3.3 10*3/uL — ABNORMAL LOW (ref 4.0–10.5)
nRBC: 0 % (ref 0.0–0.2)

## 2023-02-02 LAB — TROPONIN I (HIGH SENSITIVITY)
Troponin I (High Sensitivity): 21 ng/L — ABNORMAL HIGH (ref <18)
Troponin I (High Sensitivity): 21 ng/L — ABNORMAL HIGH (ref <18)

## 2023-02-02 LAB — LIPASE, BLOOD: Lipase: 66 U/L — ABNORMAL HIGH (ref 11–51)

## 2023-02-02 LAB — RAPID URINE DRUG SCREEN, HOSP PERFORMED
Amphetamines: NOT DETECTED
Barbiturates: NOT DETECTED
Benzodiazepines: NOT DETECTED
Cocaine: NOT DETECTED
Opiates: NOT DETECTED
Tetrahydrocannabinol: NOT DETECTED

## 2023-02-02 LAB — BRAIN NATRIURETIC PEPTIDE: B Natriuretic Peptide: 368.1 pg/mL — ABNORMAL HIGH (ref 0.0–100.0)

## 2023-02-02 MED ORDER — FUROSEMIDE 20 MG PO TABS: 20.0000 mg | ORAL_TABLET | Freq: Every day | ORAL | 0 refills | Status: DC

## 2023-02-02 MED ORDER — HYDRALAZINE HCL 25 MG PO TABS
25.0000 mg | ORAL_TABLET | Freq: Once | ORAL | Status: AC
  Administered 2023-02-02: 25 mg via ORAL
  Filled 2023-02-02: qty 1

## 2023-02-02 NOTE — ED Notes (Signed)
2 unsuccessful attempts at retrieving labs.

## 2023-02-02 NOTE — Progress Notes (Signed)
VASCULAR LAB    Bilateral lower extremity venous duplex has been performed.  See CV proc for preliminary results.  Gave verbal report to [employer], PA-C   Latacha Texeira, RVT 02/02/2023, 6:51 PM

## 2023-02-02 NOTE — ED Triage Notes (Addendum)
Pt BIB EMS from home. Per EMS, pt c/o CP for the last year since his bypass surgery and increased leg swelling over the last 2 days that has made walking difficult. Pt has significant cardiac hx and is non-compliant with medicine. 324mg  of aspirin given en route. A/Ox4

## 2023-02-02 NOTE — ED Notes (Signed)
Patient transported to X-ray 

## 2023-02-02 NOTE — Discharge Instructions (Addendum)
Make sure to take your medications at home  Return for new or worsening symptoms, follow-up with cardiology

## 2023-02-02 NOTE — ED Provider Notes (Signed)
[place] EMERGENCY DEPARTMENT AT [facility] Provider Note   CSN: [id number] Arrival date & time: 02/02/23  1610    History  Chief Complaint  Patient presents with   Chest Pain    Michael [name] is a 72 y.o. male history of HIV, diabetes, osteomyelitis, A-fib not anticoagulated, prior PE, COPD, CKD, chronic anemia for evaluation of chest pain.  States he has had ongoing chest pain after having CABG procedure 1 year ago.  Pain chronic and intermittent in nature.  Not worse with exertion, no pleuritic component.  No shortness of breath.  Was seen a few days ago for abdominal pain and constipation.  CT imaging showed constipation at that time.  Patient states he is no longer having abdominal pain he is having normal bowel movements.  No blood in stool.  States he thinks he has some swelling to his bilateral feet.  No pain or swelling to calves.  States she does have a history of cocaine use however has not used recently.  He cannot give me the last time he actually used.  He denies any weakness.  No headache.  Eating and drinking normally.  Does not take any medications at home.  Denies PND, orthopnea, cough, hemoptysis.  No recent surgery, immobilization or malignancy.  HPI     Home Medications Prior to Admission medications   Medication Sig Start Date End Date Taking? Authorizing Provider  furosemide (LASIX) 20 MG tablet Take 1 tablet (20 mg total) by mouth daily for 4 days. 02/02/23 02/06/23 Yes Nabilah Davoli A, PA-C  albuterol (VENTOLIN HFA) 108 (90 Base) MCG/ACT inhaler Inhale 1-2 puffs into the lungs every 6 (six) hours as needed for wheezing or shortness of breath. Patient not taking: Reported on 01/27/2023 10/24/22   [place], Eric J, DO  aspirin 81 MG chewable tablet Chew by mouth daily. Patient not taking: Reported on 01/27/2023    [provider]  atorvastatin (LIPITOR) 40 MG tablet Take 1 tablet (40 mg total) by mouth daily. Patient not taking: Reported on  01/27/2023 12/09/22 04/08/23  Sarina Ill, DO  b complex-vitamin c-folic acid (NEPHRO-VITE) 0.8 MG TABS tablet Take 1 tablet by mouth daily. Patient not taking: Reported on 01/27/2023    [provider]  diltiazem (CARDIZEM CD) 180 MG 24 hr capsule Take 1 capsule (180 mg total) by mouth daily. Patient not taking: Reported on 01/27/2023 01/26/23 02/25/23  Jerald Kief, MD  ferrous sulfate 325 (65 FE) MG tablet Take 1 tablet (325 mg total) by mouth daily with breakfast. Patient not taking: Reported on 01/27/2023 12/17/22   Regalado, Jon Billings A, MD  FLUoxetine (PROZAC) 20 MG capsule Take 1 capsule (20 mg total) by mouth daily. Patient not taking: Reported on 01/27/2023 12/17/22   Regalado, Jon Billings A, MD  fluticasone furoate-vilanterol (BREO ELLIPTA) 200-25 MCG/ACT AEPB Inhale 1 puff into the lungs daily. Patient not taking: Reported on 01/27/2023 12/10/22   Sarina Ill, DO  folic acid (FOLVITE) 1 MG tablet Take 1 tablet (1 mg total) by mouth daily. Patient not taking: Reported on 01/27/2023 12/17/22   Hartley Barefoot A, MD  hydrALAZINE (APRESOLINE) 25 MG tablet Take 1 tablet (25 mg total) by mouth every 8 (eight) hours. Patient not taking: Reported on 01/27/2023 01/25/23 02/24/23  Jerald Kief, MD  isosorbide mononitrate (IMDUR) 30 MG 24 hr tablet Take 0.5 tablets (15 mg total) by mouth daily. Patient not taking: Reported on 01/27/2023 12/10/22   Sarina Ill, DO  nitroGLYCERIN (  NITROSTAT) 0.4 MG SL tablet Place 1 tablet (0.4 mg total) under the tongue every 5 (five) minutes as needed for chest pain. Patient not taking: Reported on 01/27/2023 12/09/22   Sarina Ill, DO  pantoprazole (PROTONIX) 40 MG tablet Take 1 tablet (40 mg total) by mouth daily. Patient not taking: Reported on 01/27/2023 12/17/22   Regalado, Jon Billings A, MD  polyethylene glycol (MIRALAX / GLYCOLAX) 17 g packet Take 17 g by mouth 2 (two) times daily. Patient not taking: Reported on 01/27/2023 12/17/22    Regalado, Jon Billings A, MD  QUEtiapine (SEROQUEL) 50 MG tablet Take 1 tablet (50 mg total) by mouth at bedtime. Patient not taking: Reported on 01/27/2023 12/09/22   Sarina Ill, DO  sodium bicarbonate 650 MG tablet Take 1 tablet (650 mg total) by mouth 3 (three) times daily. Patient not taking: Reported on 01/27/2023 12/16/22   Alba Cory, MD      Allergies    Patient has no known allergies.    Review of Systems   Review of Systems  Constitutional: Negative.   HENT: Negative.    Respiratory: Negative.    Cardiovascular:  Positive for chest pain and leg swelling. Negative for palpitations.  Gastrointestinal: Negative.   Genitourinary: Negative.   Musculoskeletal: Negative.   Skin: Negative.   Neurological: Negative.   All other systems reviewed and are negative.   Physical Exam Updated Vital Signs BP (!) 188/107   Pulse 86   Temp 97.8 F (36.6 C) (Oral)   Resp 18   Ht 5\' 8"  (1.727 m)   Wt 74.4 kg   SpO2 100%   BMI 24.94 kg/m  Physical Exam Vitals and nursing note reviewed.  Constitutional:      General: He is not in acute distress.    Appearance: He is well-developed. He is not ill-appearing, toxic-appearing or diaphoretic.  HENT:     Head: Normocephalic and atraumatic.  Eyes:     Pupils: Pupils are equal, round, and reactive to light.  Cardiovascular:     Rate and Rhythm: Normal rate and regular rhythm.     Pulses:          Radial pulses are 2+ on the right side and 2+ on the left side.       Dorsalis pedis pulses are 2+ on the right side and 2+ on the left side.     Heart sounds: Normal heart sounds.  Pulmonary:     Effort: Pulmonary effort is normal. No respiratory distress.     Breath sounds: Normal breath sounds.     Comments: Clear bilaterally, speaks in full sentences without difficulty Chest:     Comments: Tenderness to anterior chest wall without crepitus or step-off Midline surgical scar Abdominal:     General: Bowel sounds are normal.  There is no distension.     Palpations: Abdomen is soft.     Tenderness: There is no abdominal tenderness.  Musculoskeletal:        General: Normal range of motion.     Cervical back: Normal range of motion and neck supple.     Right lower leg: No edema.     Left lower leg: No edema.     Comments: Trace nonpitting edema bilateral ankles.  No bony tenderness.  Full range of motion.  Calf soft, nontender.  No overlying erythema or warmth.  Skin:    General: Skin is warm and dry.     Capillary Refill: Capillary refill takes less than 2  seconds.     Comments: No erythema, warmth  Neurological:     General: No focal deficit present.     Mental Status: He is alert and oriented to person, place, and time.    ED Results / Procedures / Treatments   Labs (all labs ordered are listed, but only abnormal results are displayed) Labs Reviewed  CBC WITH DIFFERENTIAL/PLATELET - Abnormal; Notable for the following components:      Result Value   WBC 3.3 (*)    RBC 2.38 (*)    Hemoglobin 7.0 (*)    HCT 21.3 (*)    Platelets 116 (*)    All other components within normal limits  COMPREHENSIVE METABOLIC PANEL - Abnormal; Notable for the following components:   Chloride 113 (*)    CO2 20 (*)    Glucose, Bld 111 (*)    BUN 55 (*)    Creatinine, Ser 3.29 (*)    Calcium 8.0 (*)    Total Protein 5.8 (*)    Albumin 2.9 (*)    AST 60 (*)    ALT 95 (*)    GFR, Estimated 19 (*)    All other components within normal limits  LIPASE, BLOOD - Abnormal; Notable for the following components:   Lipase 66 (*)    All other components within normal limits  BRAIN NATRIURETIC PEPTIDE - Abnormal; Notable for the following components:   B Natriuretic Peptide 368.1 (*)    All other components within normal limits  TROPONIN I (HIGH SENSITIVITY) - Abnormal; Notable for the following components:   Troponin I (High Sensitivity) 21 (*)    All other components within normal limits  TROPONIN I (HIGH SENSITIVITY) -  Abnormal; Notable for the following components:   Troponin I (High Sensitivity) 21 (*)    All other components within normal limits  RAPID URINE DRUG SCREEN, HOSP PERFORMED    EKG EKG Interpretation  Date/Time:  Sunday February 02 2023 16:28:56 EDT Ventricular Rate:  88 PR Interval:  142 QRS Duration: 84 QT Interval:  384 QTC Calculation: 465 R Axis:   64 Text Interpretation: Sinus rhythm Probable LVH with secondary repol abnrm No significant change since last tracing Confirmed by Gwyneth Sprout ([id number]) on 02/02/2023 4:37:36 PM  Radiology VAS [place] LOWER EXTREMITY VENOUS (DVT) (ONLY [facility] & [facility])  Result Date: 02/02/2023  Lower Venous DVT Study Patient Name:  Michael [name]  Date of Exam:   02/02/2023 Medical Rec #: [id number]            Accession #:    [id number] Date of Birth: 02-27-1951            Patient Gender: M Patient Age:   34 years Exam Location:  [facility] Procedure:      VAS [place] LOWER EXTREMITY VENOUS (DVT) Referring Phys: Rei Medlen --------------------------------------------------------------------------------  Indications:  Risk Factors: Significant cardiac history i, non compliant with medication. Cocaine abuse.  Performing Technologist: Sherren Kerns RVS  Examination Guidelines: A complete evaluation includes B-mode imaging, spectral Doppler, color Doppler, and power Doppler as needed of all accessible portions of each vessel. Bilateral testing is considered an integral part of a complete examination. Limited examinations for reoccurring indications may be performed as noted. The reflux portion of the exam is performed with the patient in reverse Trendelenburg.  +---------+---------------+---------+-----------+----------+--------------+ RIGHT    CompressibilityPhasicitySpontaneityPropertiesThrombus Aging +---------+---------------+---------+-----------+----------+--------------+ CFV      Full           Yes  Yes                                  +---------+---------------+---------+-----------+----------+--------------+ SFJ      Full                                                        +---------+---------------+---------+-----------+----------+--------------+ FV Prox  Full                                                        +---------+---------------+---------+-----------+----------+--------------+ FV Mid   Full           Yes      Yes                                 +---------+---------------+---------+-----------+----------+--------------+ FV DistalFull                                                        +---------+---------------+---------+-----------+----------+--------------+ PFV      Full                                                        +---------+---------------+---------+-----------+----------+--------------+ POP      Full           Yes      Yes                                 +---------+---------------+---------+-----------+----------+--------------+ PTV      Full                                                        +---------+---------------+---------+-----------+----------+--------------+ PERO     Full                                                        +---------+---------------+---------+-----------+----------+--------------+   +---------+---------------+---------+-----------+----------+--------------+ LEFT     CompressibilityPhasicitySpontaneityPropertiesThrombus Aging +---------+---------------+---------+-----------+----------+--------------+ CFV      Full           Yes      Yes                                 +---------+---------------+---------+-----------+----------+--------------+ SFJ      Full                                                        +---------+---------------+---------+-----------+----------+--------------+  FV Prox  Full                                                         +---------+---------------+---------+-----------+----------+--------------+ FV Mid   Full           Yes      Yes                                 +---------+---------------+---------+-----------+----------+--------------+ FV DistalFull                                                        +---------+---------------+---------+-----------+----------+--------------+ PFV      Full                                                        +---------+---------------+---------+-----------+----------+--------------+ POP      Full           Yes      Yes                                 +---------+---------------+---------+-----------+----------+--------------+ PTV      Full                                                        +---------+---------------+---------+-----------+----------+--------------+ PERO     Full                                                        +---------+---------------+---------+-----------+----------+--------------+     Summary: BILATERAL: - No evidence of deep vein thrombosis seen in the lower extremities, bilaterally. -No evidence of popliteal cyst, bilaterally.   *See table(s) above for measurements and observations.    Preliminary    DG Chest 2 View  Result Date: 02/02/2023 CLINICAL DATA:  Chest pain EXAM: CHEST - 2 VIEW COMPARISON:  Chest x-ray 01/27/2023 FINDINGS: Patient is status post cardiac surgery. The heart size and mediastinal contours are within normal limits. Both lungs are clear. The visualized skeletal structures are unremarkable. IMPRESSION: No active cardiopulmonary disease. Electronically Signed   By: Darliss Cheney M.D.   On: 02/02/2023 18:09    Procedures Procedures    Medications Ordered in ED Medications  hydrALAZINE (APRESOLINE) tablet 25 mg (25 mg Oral Given 02/02/23 2211)    ED Course/ Medical Decision Making/ A&P    72 year old here for evaluation of chest pain.  He has complicated medical history.  Sounds like he  has had ongoing intermittent chest pain after CABG procedure last year.  No associated shortness of  breath, diaphoresis.  Symptoms are nonexertional, nonpleuritic in nature.  Worse when he palpates his anterior chest.  No overlying skin changes to his chest wall, no recent falls or injuries.  Has noted some trace swelling to his bilateral feet.  His compartments are soft.  He is neurovascularly intact.  No pain when he is still.  Pain worsens with movement such as palpation of chest.  Recently seen in the emergency department a few days ago for abdominal pain, constipation.  Had CT for scan performed at that time which showed constipation.  States he took MiraLAX at home and the symptoms resolved.  He is no longer having abdominal pain.  No bloody stool.  Plan on labs, imaging and reassess  Labs and imaging personally viewed and interpreted:  CBC without leukocytosis, hemoglobin 7.0, baseline hemoglobin 7-8 Metabolic panel glucose 111, creatinine 3.29, similar to prior, elevated FLT Lipase 66 at baseline Troponin 21, similar to prior BNP 368, up from 100s previously Chest X-ray without cardiomegaly, pulm edema, pneumothorax, infiltrates US DVT neg bil EKG without ischemic changes  Discussed results with patient in room.  Elevated blood pressure here however has been noncompliant to his home medications.  Will give his home hydralazine.  His labs appear to be at baseline aside from mildly elevated BNP.  I have low suspicion for hypertensive urgency, emergency, unstable angina, ACS, PE, dissection, infectious process.  Will give him a few days of low-dose Lasix given his known CKD.  I encouraged compliance with his home medications, follow-up PCP and return for new or worsening symptoms which she is agreeable for.  The patient has been appropriately medically screened and/or stabilized in the ED. I have low suspicion for any other emergent medical condition which would require further screening,  evaluation or treatment in the ED or require inpatient management.  Patient is hemodynamically stable and in no acute distress.  Patient able to ambulate in department prior to ED.  Evaluation does not show acute pathology that would require ongoing or additional emergent interventions while in the emergency department or further inpatient treatment.  I have discussed the diagnosis with the patient and answered all questions.  Pain is been managed while in the emergency department and patient has no further complaints prior to discharge.  Patient is comfortable with plan discussed in room and is stable for discharge at this time.  I have discussed strict return precautions for returning to the emergency department.  Patient was encouraged to follow-up with PCP/specialist refer to at discharge.                             Medical Decision Making Amount and/or Complexity of Data Reviewed External Data Reviewed: labs, radiology, ECG and notes. Labs: ordered. Decision-making details documented in ED Course. Radiology: ordered and independent interpretation performed. Decision-making details documented in ED Course. ECG/medicine tests: ordered and independent interpretation performed. Decision-making details documented in ED Course.  Risk OTC drugs. Prescription drug management. Parenteral controlled substances. Decision regarding hospitalization. Diagnosis or treatment significantly limited by social determinants of health.          Final Clinical Impression(s) / ED Diagnoses Final diagnoses:  Chest pain, unspecified type  Elevated blood pressure reading  Non compliance w medication regimen  Anemia, unspecified type    Rx / DC Orders ED Discharge Orders          Ordered    furosemide (LASIX) 20 MG tablet  Daily  02/02/23 2227              Aaiden Depoy A, PA-C 02/02/23 2238    Gwyneth Sprout, MD 02/04/23 [phone]

## 2023-02-04 LAB — AMB RESULTS CONSOLE CBG: Glucose: 137

## 2023-02-10 ENCOUNTER — Encounter: Payer: Self-pay | Admitting: *Deleted

## 2023-02-10 ENCOUNTER — Other Ambulatory Visit: Payer: Self-pay

## 2023-02-10 ENCOUNTER — Encounter (HOSPITAL_COMMUNITY): Payer: Self-pay

## 2023-02-10 ENCOUNTER — Emergency Department (HOSPITAL_COMMUNITY): Payer: 59

## 2023-02-10 ENCOUNTER — Emergency Department (HOSPITAL_COMMUNITY)
Admission: EM | Admit: 2023-02-10 | Discharge: 2023-02-10 | Disposition: A | Discharge status: Home / Self Care | Payer: 59 | Attending: Emergency Medicine | Admitting: Emergency Medicine

## 2023-02-10 DIAGNOSIS — I2510 Atherosclerotic heart disease of native coronary artery without angina pectoris: Secondary | ICD-10-CM | POA: Insufficient documentation

## 2023-02-10 DIAGNOSIS — J45909 Unspecified asthma, uncomplicated: Secondary | ICD-10-CM | POA: Insufficient documentation

## 2023-02-10 DIAGNOSIS — I129 Hypertensive chronic kidney disease with stage 1 through stage 4 chronic kidney disease, or unspecified chronic kidney disease: Secondary | ICD-10-CM | POA: Insufficient documentation

## 2023-02-10 DIAGNOSIS — R079 Chest pain, unspecified: Secondary | ICD-10-CM | POA: Insufficient documentation

## 2023-02-10 DIAGNOSIS — M545 Low back pain, unspecified: Secondary | ICD-10-CM | POA: Insufficient documentation

## 2023-02-10 DIAGNOSIS — Z7982 Long term (current) use of aspirin: Secondary | ICD-10-CM | POA: Insufficient documentation

## 2023-02-10 DIAGNOSIS — R45851 Suicidal ideations: Secondary | ICD-10-CM | POA: Insufficient documentation

## 2023-02-10 DIAGNOSIS — E1122 Type 2 diabetes mellitus with diabetic chronic kidney disease: Secondary | ICD-10-CM | POA: Diagnosis not present

## 2023-02-10 DIAGNOSIS — N184 Chronic kidney disease, stage 4 (severe): Secondary | ICD-10-CM | POA: Insufficient documentation

## 2023-02-10 DIAGNOSIS — Z21 Asymptomatic human immunodeficiency virus [HIV] infection status: Secondary | ICD-10-CM | POA: Insufficient documentation

## 2023-02-10 DIAGNOSIS — R7989 Other specified abnormal findings of blood chemistry: Secondary | ICD-10-CM | POA: Diagnosis not present

## 2023-02-10 DIAGNOSIS — Z951 Presence of aortocoronary bypass graft: Secondary | ICD-10-CM | POA: Diagnosis not present

## 2023-02-10 DIAGNOSIS — Z7951 Long term (current) use of inhaled steroids: Secondary | ICD-10-CM | POA: Diagnosis not present

## 2023-02-10 DIAGNOSIS — Z79899 Other long term (current) drug therapy: Secondary | ICD-10-CM | POA: Diagnosis not present

## 2023-02-10 DIAGNOSIS — F1910 Other psychoactive substance abuse, uncomplicated: Secondary | ICD-10-CM | POA: Insufficient documentation

## 2023-02-10 LAB — CBC
HCT: 25.1 % — ABNORMAL LOW (ref 39.0–52.0)
Hemoglobin: 7.9 g/dL — ABNORMAL LOW (ref 13.0–17.0)
MCH: 28.3 pg (ref 26.0–34.0)
MCHC: 31.5 g/dL (ref 30.0–36.0)
MCV: 90 fL (ref 80.0–100.0)
Platelets: 109 10*3/uL — ABNORMAL LOW (ref 150–400)
RBC: 2.79 MIL/uL — ABNORMAL LOW (ref 4.22–5.81)
RDW: 13.9 % (ref 11.5–15.5)
WBC: 3.9 10*3/uL — ABNORMAL LOW (ref 4.0–10.5)
nRBC: 0 % (ref 0.0–0.2)

## 2023-02-10 LAB — BASIC METABOLIC PANEL
Anion gap: 9 (ref 5–15)
BUN: 64 mg/dL — ABNORMAL HIGH (ref 8–23)
CO2: 17 mmol/L — ABNORMAL LOW (ref 22–32)
Calcium: 8 mg/dL — ABNORMAL LOW (ref 8.9–10.3)
Chloride: 113 mmol/L — ABNORMAL HIGH (ref 98–111)
Creatinine, Ser: 3.39 mg/dL — ABNORMAL HIGH (ref 0.61–1.24)
GFR, Estimated: 18 mL/min — ABNORMAL LOW (ref >60)
Glucose, Bld: 159 mg/dL — ABNORMAL HIGH (ref 70–99)
Potassium: 4.7 mmol/L (ref 3.5–5.1)
Sodium: 139 mmol/L (ref 135–145)

## 2023-02-10 LAB — TROPONIN I (HIGH SENSITIVITY)
Troponin I (High Sensitivity): 42 ng/L — ABNORMAL HIGH (ref <18)
Troponin I (High Sensitivity): 43 ng/L — ABNORMAL HIGH (ref <18)

## 2023-02-10 NOTE — ED Provider Notes (Signed)
Care of patient received from prior provider at 6:33 PM, please see their note for complete H/P and care plan.  Received handoff per ED course.  Clinical Course as of 02/10/23 1833  Mon Feb 10, 2023  1507 Stable 71 YOM  WK to department Cocaine use last night, well known by [facility] Troponin bump thought to be 2/2 CUD Delta reassess. [CC]  1815 Troponins stable   [CC]    Clinical Course User Index [CC] Glyn Ade, MD    Reassessment: Reevaluated primarily at bedside.  Patient is resting comfortably at bedside.  Patient did cocaine last night.  Chest pain is resolved.  Will have him follow-up with his normal cardiologist and primary care provider.  Recommended complete cessation of further stimulant abuse for risk for severe cardiac event and death and patient expressed understanding.  Patient felt comfortable discharge at this time discharged to continued outpatient care and management.     Glyn Ade, MD 02/10/23 [phone]

## 2023-02-10 NOTE — ED Provider Notes (Signed)
[place] EMERGENCY DEPARTMENT AT [facility] Provider Note   CSN: [id number] Arrival date & time: 02/10/23  1039     History  Chief Complaint  Patient presents with   Suicidal    Michael [name] is a 72 y.o. male.  HPI Patient resents with chest pain.  Anterior chest.  States has been having for a while now.  Dull pressure.  Reportedly has had it since having a CABG.  Also some shortness of breath.  Has not taken his medicine.  Hypertensive.  Reportedly some suicidal thoughts.  Did smoke crack last night.  Also lower back pain.  Multiple visits to the ER for similar symptoms.   Past Medical History:  Diagnosis Date   A-fib ([name])    Anemia    Asthma    No PFTs, history of childhood asthma   CAD (coronary artery disease)    Cellulitis 04/2014   left facial   Cellulitis and abscess of toe of right foot 12/08/2019   Chondromalacia of medial femoral condyle    Left knee MRI 04/28/12: Chondromalacia of the medial femoral condyle with slight peripheral degeneration of the meniscocapsular junction of the medial meniscus; followed by sports medicine   Chronic kidney failure, stage 4 (severe) ([name])    Collagen vascular disease ([name])    Crack cocaine use    for 20+ years, has been enrolled in detox programs in the past   Depression    with history of hospitalization for suicidal ideation   Diabetes mellitus 2002   Diagnosed in 2002, started insulin in 2012   Gout    Headache(784.0)    CT head 08/2011: Periventricular and subcortical white matter hypodensities are most in keeping with chronic microangiopathic change   HIV infection ([name]) 08/2011   Followed by Dr. Ninetta Lights   Hyperlipidemia    Hypertension    Pulmonary embolism ([name])     Home Medications Prior to Admission medications   Medication Sig Start Date End Date Taking? Authorizing Provider  albuterol (VENTOLIN HFA) 108 (90 Base) MCG/ACT inhaler Inhale 1-2 puffs into the lungs every 6 (six) hours as  needed for wheezing or shortness of breath. Patient not taking: Reported on 01/27/2023 10/24/22   [place], Eric J, DO  aspirin 81 MG chewable tablet Chew by mouth daily. Patient not taking: Reported on 01/27/2023    [provider]  atorvastatin (LIPITOR) 40 MG tablet Take 1 tablet (40 mg total) by mouth daily. Patient not taking: Reported on 01/27/2023 12/09/22 04/08/23  Sarina Ill, DO  b complex-vitamin c-folic acid (NEPHRO-VITE) 0.8 MG TABS tablet Take 1 tablet by mouth daily. Patient not taking: Reported on 01/27/2023    [provider]  diltiazem (CARDIZEM CD) 180 MG 24 hr capsule Take 1 capsule (180 mg total) by mouth daily. Patient not taking: Reported on 01/27/2023 01/26/23 02/25/23  Jerald Kief, MD  ferrous sulfate 325 (65 FE) MG tablet Take 1 tablet (325 mg total) by mouth daily with breakfast. Patient not taking: Reported on 01/27/2023 12/17/22   Regalado, Jon Billings A, MD  FLUoxetine (PROZAC) 20 MG capsule Take 1 capsule (20 mg total) by mouth daily. Patient not taking: Reported on 01/27/2023 12/17/22   Regalado, Jon Billings A, MD  fluticasone furoate-vilanterol (BREO ELLIPTA) 200-25 MCG/ACT AEPB Inhale 1 puff into the lungs daily. Patient not taking: Reported on 01/27/2023 12/10/22   Sarina Ill, DO  folic acid (FOLVITE) 1 MG tablet Take 1 tablet (1 mg total) by mouth daily. Patient  not taking: Reported on 01/27/2023 12/17/22   Regalado, Jon Billings A, MD  furosemide (LASIX) 20 MG tablet Take 1 tablet (20 mg total) by mouth daily for 4 days. 02/02/23 02/06/23  Henderly, Britni A, PA-C  hydrALAZINE (APRESOLINE) 25 MG tablet Take 1 tablet (25 mg total) by mouth every 8 (eight) hours. Patient not taking: Reported on 01/27/2023 01/25/23 02/24/23  Jerald Kief, MD  isosorbide mononitrate (IMDUR) 30 MG 24 hr tablet Take 0.5 tablets (15 mg total) by mouth daily. Patient not taking: Reported on 01/27/2023 12/10/22   Sarina Ill, DO  nitroGLYCERIN (NITROSTAT) 0.4 MG SL  tablet Place 1 tablet (0.4 mg total) under the tongue every 5 (five) minutes as needed for chest pain. Patient not taking: Reported on 01/27/2023 12/09/22   Sarina Ill, DO  pantoprazole (PROTONIX) 40 MG tablet Take 1 tablet (40 mg total) by mouth daily. Patient not taking: Reported on 01/27/2023 12/17/22   Regalado, Jon Billings A, MD  polyethylene glycol (MIRALAX / GLYCOLAX) 17 g packet Take 17 g by mouth 2 (two) times daily. Patient not taking: Reported on 01/27/2023 12/17/22   Regalado, Jon Billings A, MD  QUEtiapine (SEROQUEL) 50 MG tablet Take 1 tablet (50 mg total) by mouth at bedtime. Patient not taking: Reported on 01/27/2023 12/09/22   Sarina Ill, DO  sodium bicarbonate 650 MG tablet Take 1 tablet (650 mg total) by mouth 3 (three) times daily. Patient not taking: Reported on 01/27/2023 12/16/22   Alba Cory, MD      Allergies    Patient has no known allergies.    Review of Systems   Review of Systems  Physical Exam Updated Vital Signs BP (!) 164/88 (BP Location: Left Arm)   Pulse 77   Temp 98.7 F (37.1 C) (Oral)   Resp 15   Ht 5\' 8"  (1.727 m)   Wt 75 kg   SpO2 100%   BMI 25.14 kg/m  Physical Exam Vitals and nursing note reviewed.  HENT:     Head: Normocephalic.  Cardiovascular:     Rate and Rhythm: Regular rhythm.  Pulmonary:     Breath sounds: No wheezing.  Abdominal:     Tenderness: There is no abdominal tenderness.  Musculoskeletal:        General: Tenderness present.     Cervical back: Neck supple.     Comments: Lumbar spine tenderness without deformity.  Neurological:     Mental Status: He is alert and oriented to person, place, and time.     ED Results / Procedures / Treatments   Labs (all labs ordered are listed, but only abnormal results are displayed) Labs Reviewed  BASIC METABOLIC PANEL - Abnormal; Notable for the following components:      Result Value   Chloride 113 (*)    CO2 17 (*)    Glucose, Bld 159 (*)    BUN 64 (*)     Creatinine, Ser 3.39 (*)    Calcium 8.0 (*)    GFR, Estimated 18 (*)    All other components within normal limits  CBC - Abnormal; Notable for the following components:   WBC 3.9 (*)    RBC 2.79 (*)    Hemoglobin 7.9 (*)    HCT 25.1 (*)    Platelets 109 (*)    All other components within normal limits  TROPONIN I (HIGH SENSITIVITY) - Abnormal; Notable for the following components:   Troponin I (High Sensitivity) 42 (*)    All other components  within normal limits  TROPONIN I (HIGH SENSITIVITY)    EKG EKG Interpretation  Date/Time:  Monday Feb 10 2023 11:58:02 EDT Ventricular Rate:  80 PR Interval:  132 QRS Duration: 70 QT Interval:  392 QTC Calculation: 452 R Axis:   72 Text Interpretation: Normal sinus rhythm Nonspecific T wave abnormality Abnormal ECG When compared with ECG of 02-Feb-2023 16:28, No significant change since last tracing Confirmed by Benjiman Core [phone]) on 02/10/2023 1:20:54 PM  Radiology DG Chest 2 View  Result Date: 02/10/2023 CLINICAL DATA:  Chest pain.  Cough and shortness of breath. EXAM: CHEST - 2 VIEW COMPARISON:  Chest radiographs 02/02/2023 FINDINGS: Prior cardiac surgery is again noted. The cardiomediastinal silhouette is unchanged with normal heart size. No airspace consolidation, edema, pleural effusion, or pneumothorax is identified. Prior cervical spine fusion is noted. IMPRESSION: No active cardiopulmonary disease. Electronically Signed   By: Sebastian Ache M.D.   On: 02/10/2023 11:45    Procedures Procedures    Medications Ordered in ED Medications - No data to display  ED Course/ Medical Decision Making/ A&P                             Medical Decision Making Amount and/or Complexity of Data Reviewed Labs: ordered. Radiology: ordered.   Patient with chest pain.  Anterior chest.  Similar to prior.  Differential diagnosis includes coronary disease, demand ischemia, chronic pain.  EKG reassuring.  Troponin mildly elevated compared to  prior recently but with recent cocaine use will evaluate for stability.  Chest x-ray reassuring.  Reviewed previous discharge note.  Reviewed recent psychiatry notes.  Does have some passive suicidal thoughts which appear to be chronic.  Does not appear to be above his baseline.  Will recheck troponin and if stable hopefully should be able to discharge.  Blood pressure initially elevated and has come down to now 160/88.  Care turned over to oncoming provider.        Final Clinical Impression(s) / ED Diagnoses Final diagnoses:  Suicidal ideations  Polysubstance abuse ([name])  Nonspecific chest pain    Rx / DC Orders ED Discharge Orders     None         Benjiman Core, MD 02/10/23 1436

## 2023-02-10 NOTE — ED Triage Notes (Addendum)
Patient BIB [facility]. Shortness of breath for 2 weeks. History of asthma, noncompliant with any medications. Last night smoked crack. Also is suicidal without a plan. Has lower back pain.

## 2023-02-14 ENCOUNTER — Inpatient Hospital Stay (HOSPITAL_COMMUNITY)
Admission: EM | Admit: 2023-02-14 | Discharge: 2023-02-19 | DRG: 193 | Disposition: A | Discharge status: Home / Self Care | Payer: 59 | Attending: Internal Medicine | Admitting: Internal Medicine

## 2023-02-14 ENCOUNTER — Emergency Department (HOSPITAL_COMMUNITY): Payer: 59

## 2023-02-14 ENCOUNTER — Encounter (HOSPITAL_COMMUNITY): Payer: Self-pay | Admitting: Internal Medicine

## 2023-02-14 ENCOUNTER — Other Ambulatory Visit: Payer: Self-pay

## 2023-02-14 DIAGNOSIS — F1424 Cocaine dependence with cocaine-induced mood disorder: Secondary | ICD-10-CM | POA: Diagnosis present

## 2023-02-14 DIAGNOSIS — Z5986 Financial insecurity: Secondary | ICD-10-CM

## 2023-02-14 DIAGNOSIS — N184 Chronic kidney disease, stage 4 (severe): Secondary | ICD-10-CM | POA: Diagnosis present

## 2023-02-14 DIAGNOSIS — J45909 Unspecified asthma, uncomplicated: Secondary | ICD-10-CM | POA: Diagnosis present

## 2023-02-14 DIAGNOSIS — I480 Paroxysmal atrial fibrillation: Secondary | ICD-10-CM | POA: Diagnosis present

## 2023-02-14 DIAGNOSIS — E872 Acidosis, unspecified: Secondary | ICD-10-CM | POA: Diagnosis present

## 2023-02-14 DIAGNOSIS — I5033 Acute on chronic diastolic (congestive) heart failure: Secondary | ICD-10-CM | POA: Diagnosis present

## 2023-02-14 DIAGNOSIS — I130 Hypertensive heart and chronic kidney disease with heart failure and stage 1 through stage 4 chronic kidney disease, or unspecified chronic kidney disease: Secondary | ICD-10-CM | POA: Diagnosis present

## 2023-02-14 DIAGNOSIS — M797 Fibromyalgia: Secondary | ICD-10-CM | POA: Diagnosis present

## 2023-02-14 DIAGNOSIS — Z981 Arthrodesis status: Secondary | ICD-10-CM

## 2023-02-14 DIAGNOSIS — D696 Thrombocytopenia, unspecified: Secondary | ICD-10-CM | POA: Diagnosis present

## 2023-02-14 DIAGNOSIS — I5043 Acute on chronic combined systolic (congestive) and diastolic (congestive) heart failure: Secondary | ICD-10-CM

## 2023-02-14 DIAGNOSIS — Z8249 Family history of ischemic heart disease and other diseases of the circulatory system: Secondary | ICD-10-CM

## 2023-02-14 DIAGNOSIS — Z7951 Long term (current) use of inhaled steroids: Secondary | ICD-10-CM

## 2023-02-14 DIAGNOSIS — E785 Hyperlipidemia, unspecified: Secondary | ICD-10-CM | POA: Diagnosis present

## 2023-02-14 DIAGNOSIS — Z91199 Patient's noncompliance with other medical treatment and regimen due to unspecified reason: Secondary | ICD-10-CM

## 2023-02-14 DIAGNOSIS — Z5982 Transportation insecurity: Secondary | ICD-10-CM

## 2023-02-14 DIAGNOSIS — Z21 Asymptomatic human immunodeficiency virus [HIV] infection status: Secondary | ICD-10-CM | POA: Diagnosis present

## 2023-02-14 DIAGNOSIS — I2510 Atherosclerotic heart disease of native coronary artery without angina pectoris: Secondary | ICD-10-CM | POA: Diagnosis present

## 2023-02-14 DIAGNOSIS — Z91148 Patient's other noncompliance with medication regimen for other reason: Secondary | ICD-10-CM

## 2023-02-14 DIAGNOSIS — R059 Cough, unspecified: Secondary | ICD-10-CM | POA: Diagnosis not present

## 2023-02-14 DIAGNOSIS — J189 Pneumonia, unspecified organism: Secondary | ICD-10-CM | POA: Diagnosis present

## 2023-02-14 DIAGNOSIS — Z951 Presence of aortocoronary bypass graft: Secondary | ICD-10-CM

## 2023-02-14 DIAGNOSIS — N179 Acute kidney failure, unspecified: Secondary | ICD-10-CM | POA: Diagnosis present

## 2023-02-14 DIAGNOSIS — Z7982 Long term (current) use of aspirin: Secondary | ICD-10-CM

## 2023-02-14 DIAGNOSIS — R45851 Suicidal ideations: Secondary | ICD-10-CM

## 2023-02-14 DIAGNOSIS — E1122 Type 2 diabetes mellitus with diabetic chronic kidney disease: Secondary | ICD-10-CM | POA: Diagnosis present

## 2023-02-14 DIAGNOSIS — E1129 Type 2 diabetes mellitus with other diabetic kidney complication: Secondary | ICD-10-CM | POA: Diagnosis present

## 2023-02-14 DIAGNOSIS — Z91119 Patient's noncompliance with dietary regimen due to unspecified reason: Secondary | ICD-10-CM

## 2023-02-14 DIAGNOSIS — Z79899 Other long term (current) drug therapy: Secondary | ICD-10-CM

## 2023-02-14 DIAGNOSIS — F332 Major depressive disorder, recurrent severe without psychotic features: Secondary | ICD-10-CM | POA: Diagnosis present

## 2023-02-14 DIAGNOSIS — F1994 Other psychoactive substance use, unspecified with psychoactive substance-induced mood disorder: Secondary | ICD-10-CM | POA: Diagnosis present

## 2023-02-14 DIAGNOSIS — Z5900 Homelessness unspecified: Secondary | ICD-10-CM

## 2023-02-14 DIAGNOSIS — F1420 Cocaine dependence, uncomplicated: Secondary | ICD-10-CM | POA: Diagnosis present

## 2023-02-14 DIAGNOSIS — Z955 Presence of coronary angioplasty implant and graft: Secondary | ICD-10-CM

## 2023-02-14 DIAGNOSIS — D631 Anemia in chronic kidney disease: Secondary | ICD-10-CM | POA: Diagnosis present

## 2023-02-14 DIAGNOSIS — Z1152 Encounter for screening for COVID-19: Secondary | ICD-10-CM

## 2023-02-14 DIAGNOSIS — Z9151 Personal history of suicidal behavior: Secondary | ICD-10-CM

## 2023-02-14 DIAGNOSIS — Z833 Family history of diabetes mellitus: Secondary | ICD-10-CM

## 2023-02-14 LAB — CBC WITH DIFFERENTIAL/PLATELET
Abs Immature Granulocytes: 0.06 10*3/uL (ref 0.00–0.07)
Basophils Absolute: 0 10*3/uL (ref 0.0–0.1)
Basophils Relative: 0 %
Eosinophils Absolute: 0.1 10*3/uL (ref 0.0–0.5)
Eosinophils Relative: 1 %
HCT: 25.6 % — ABNORMAL LOW (ref 39.0–52.0)
Hemoglobin: 7.9 g/dL — ABNORMAL LOW (ref 13.0–17.0)
Immature Granulocytes: 1 %
Lymphocytes Relative: 13 %
Lymphs Abs: 0.9 10*3/uL (ref 0.7–4.0)
MCH: 28.4 pg (ref 26.0–34.0)
MCHC: 30.9 g/dL (ref 30.0–36.0)
MCV: 92.1 fL (ref 80.0–100.0)
Monocytes Absolute: 0.4 10*3/uL (ref 0.1–1.0)
Monocytes Relative: 6 %
Neutro Abs: 5.5 10*3/uL (ref 1.7–7.7)
Neutrophils Relative %: 79 %
Platelets: 114 10*3/uL — ABNORMAL LOW (ref 150–400)
RBC: 2.78 MIL/uL — ABNORMAL LOW (ref 4.22–5.81)
RDW: 14 % (ref 11.5–15.5)
WBC: 7 10*3/uL (ref 4.0–10.5)
nRBC: 0 % (ref 0.0–0.2)

## 2023-02-14 LAB — URINALYSIS, ROUTINE W REFLEX MICROSCOPIC
Bacteria, UA: NONE SEEN
Bilirubin Urine: NEGATIVE
Glucose, UA: NEGATIVE mg/dL
Ketones, ur: NEGATIVE mg/dL
Leukocytes,Ua: NEGATIVE
Nitrite: NEGATIVE
Protein, ur: 100 mg/dL — AB
Specific Gravity, Urine: 1.013 (ref 1.005–1.030)
pH: 5 (ref 5.0–8.0)

## 2023-02-14 LAB — RAPID URINE DRUG SCREEN, HOSP PERFORMED
Amphetamines: NOT DETECTED
Barbiturates: NOT DETECTED
Benzodiazepines: NOT DETECTED
Cocaine: POSITIVE — AB
Opiates: NOT DETECTED
Tetrahydrocannabinol: NOT DETECTED

## 2023-02-14 LAB — COMPREHENSIVE METABOLIC PANEL
ALT: 115 U/L — ABNORMAL HIGH (ref 0–44)
AST: 66 U/L — ABNORMAL HIGH (ref 15–41)
Albumin: 3.2 g/dL — ABNORMAL LOW (ref 3.5–5.0)
Alkaline Phosphatase: 101 U/L (ref 38–126)
Anion gap: 12 (ref 5–15)
BUN: 78 mg/dL — ABNORMAL HIGH (ref 8–23)
CO2: 13 mmol/L — ABNORMAL LOW (ref 22–32)
Calcium: 7.9 mg/dL — ABNORMAL LOW (ref 8.9–10.3)
Chloride: 117 mmol/L — ABNORMAL HIGH (ref 98–111)
Creatinine, Ser: 3.8 mg/dL — ABNORMAL HIGH (ref 0.61–1.24)
GFR, Estimated: 16 mL/min — ABNORMAL LOW (ref >60)
Glucose, Bld: 101 mg/dL — ABNORMAL HIGH (ref 70–99)
Potassium: 5 mmol/L (ref 3.5–5.1)
Sodium: 142 mmol/L (ref 135–145)
Total Bilirubin: 0.3 mg/dL (ref 0.3–1.2)
Total Protein: 6.8 g/dL (ref 6.5–8.1)

## 2023-02-14 LAB — RESP PANEL BY RT-PCR (RSV, FLU A&B, COVID)  RVPGX2
Influenza A by PCR: NEGATIVE
Influenza B by PCR: NEGATIVE
Resp Syncytial Virus by PCR: NEGATIVE
SARS Coronavirus 2 by RT PCR: NEGATIVE

## 2023-02-14 LAB — BRAIN NATRIURETIC PEPTIDE: B Natriuretic Peptide: 662.2 pg/mL — ABNORMAL HIGH (ref 0.0–100.0)

## 2023-02-14 LAB — T-HELPER CELLS (CD4) COUNT (NOT AT ARMC)
CD4 % Helper T Cell: 32 % — ABNORMAL LOW (ref 33–65)
CD4 T Cell Abs: 203 /uL — ABNORMAL LOW (ref 400–1790)

## 2023-02-14 LAB — GLUCOSE, CAPILLARY: Glucose-Capillary: 168 mg/dL — ABNORMAL HIGH (ref 70–99)

## 2023-02-14 MED ORDER — DOCUSATE SODIUM 100 MG PO CAPS
100.0000 mg | ORAL_CAPSULE | Freq: Two times a day (BID) | ORAL | Status: DC
  Administered 2023-02-14 – 2023-02-19 (×10): 100 mg via ORAL
  Filled 2023-02-14 (×10): qty 1

## 2023-02-14 MED ORDER — FUROSEMIDE 10 MG/ML IJ SOLN
40.0000 mg | Freq: Once | INTRAMUSCULAR | Status: AC
  Administered 2023-02-14: 40 mg via INTRAVENOUS
  Filled 2023-02-14: qty 4

## 2023-02-14 MED ORDER — TRAZODONE HCL 50 MG PO TABS: 25.0000 mg | ORAL_TABLET | Freq: Every evening | ORAL | Status: DC | PRN

## 2023-02-14 MED ORDER — QUETIAPINE FUMARATE 25 MG PO TABS
50.0000 mg | ORAL_TABLET | Freq: Every day | ORAL | Status: DC
  Administered 2023-02-14 – 2023-02-18 (×5): 50 mg via ORAL
  Filled 2023-02-14 (×5): qty 2

## 2023-02-14 MED ORDER — ACETAMINOPHEN 650 MG RE SUPP: 650.0000 mg | Freq: Four times a day (QID) | RECTAL | Status: DC | PRN

## 2023-02-14 MED ORDER — PANTOPRAZOLE SODIUM 40 MG PO TBEC
40.0000 mg | DELAYED_RELEASE_TABLET | Freq: Every day | ORAL | Status: DC
  Administered 2023-02-14 – 2023-02-17 (×4): 40 mg via ORAL
  Filled 2023-02-14 (×4): qty 1

## 2023-02-14 MED ORDER — INSULIN ASPART 100 UNIT/ML IJ SOLN
0.0000 [IU] | Freq: Three times a day (TID) | INTRAMUSCULAR | Status: DC
  Administered 2023-02-14: 3 [IU] via SUBCUTANEOUS
  Administered 2023-02-15 – 2023-02-18 (×4): 2 [IU] via SUBCUTANEOUS

## 2023-02-14 MED ORDER — ACETAMINOPHEN 325 MG PO TABS
650.0000 mg | ORAL_TABLET | Freq: Four times a day (QID) | ORAL | Status: DC | PRN
  Administered 2023-02-19: 650 mg via ORAL
  Filled 2023-02-14: qty 2

## 2023-02-14 MED ORDER — SODIUM CHLORIDE 0.9 % IV SOLN
1.0000 g | INTRAVENOUS | Status: AC
  Administered 2023-02-15 – 2023-02-18 (×4): 1 g via INTRAVENOUS
  Filled 2023-02-14 (×4): qty 10

## 2023-02-14 MED ORDER — SODIUM CHLORIDE 0.9 % IV SOLN
500.0000 mg | INTRAVENOUS | Status: AC
  Administered 2023-02-14 – 2023-02-18 (×5): 500 mg via INTRAVENOUS
  Filled 2023-02-14 (×5): qty 5

## 2023-02-14 MED ORDER — POLYETHYLENE GLYCOL 3350 17 G PO PACK
17.0000 g | PACK | Freq: Every day | ORAL | Status: DC | PRN
  Filled 2023-02-14: qty 1

## 2023-02-14 MED ORDER — INSULIN ASPART 100 UNIT/ML IJ SOLN: 0.0000 [IU] | Freq: Every day | INTRAMUSCULAR | Status: DC

## 2023-02-14 MED ORDER — ONDANSETRON HCL 4 MG/2ML IJ SOLN
4.0000 mg | Freq: Four times a day (QID) | INTRAMUSCULAR | Status: DC | PRN
  Administered 2023-02-17 – 2023-02-19 (×2): 4 mg via INTRAVENOUS
  Filled 2023-02-14 (×2): qty 2

## 2023-02-14 MED ORDER — ASPIRIN 81 MG PO CHEW
81.0000 mg | CHEWABLE_TABLET | Freq: Every day | ORAL | Status: DC
  Administered 2023-02-14 – 2023-02-19 (×6): 81 mg via ORAL
  Filled 2023-02-14 (×6): qty 1

## 2023-02-14 MED ORDER — ALBUTEROL SULFATE (2.5 MG/3ML) 0.083% IN NEBU
2.5000 mg | INHALATION_SOLUTION | RESPIRATORY_TRACT | Status: DC | PRN
  Administered 2023-02-17: 2.5 mg via RESPIRATORY_TRACT
  Filled 2023-02-14: qty 3

## 2023-02-14 MED ORDER — DILTIAZEM HCL ER COATED BEADS 180 MG PO CP24
180.0000 mg | ORAL_CAPSULE | Freq: Every day | ORAL | Status: DC
  Administered 2023-02-14 – 2023-02-19 (×6): 180 mg via ORAL
  Filled 2023-02-14 (×6): qty 1

## 2023-02-14 MED ORDER — ONDANSETRON HCL 4 MG PO TABS: 4.0000 mg | ORAL_TABLET | Freq: Four times a day (QID) | ORAL | Status: DC | PRN

## 2023-02-14 MED ORDER — ISOSORBIDE MONONITRATE ER 30 MG PO TB24
15.0000 mg | ORAL_TABLET | Freq: Every day | ORAL | Status: DC
  Administered 2023-02-14 – 2023-02-19 (×6): 15 mg via ORAL
  Filled 2023-02-14 (×7): qty 1

## 2023-02-14 MED ORDER — SODIUM CHLORIDE 0.9 % IV SOLN
1.0000 g | Freq: Once | INTRAVENOUS | Status: AC
  Administered 2023-02-14: 1 g via INTRAVENOUS
  Filled 2023-02-14: qty 10

## 2023-02-14 MED ORDER — ENOXAPARIN SODIUM 30 MG/0.3ML IJ SOSY
30.0000 mg | PREFILLED_SYRINGE | INTRAMUSCULAR | Status: DC
  Administered 2023-02-14: 30 mg via SUBCUTANEOUS
  Filled 2023-02-14: qty 0.3

## 2023-02-14 MED ORDER — HYDRALAZINE HCL 20 MG/ML IJ SOLN
10.0000 mg | Freq: Four times a day (QID) | INTRAMUSCULAR | Status: DC | PRN
  Administered 2023-02-18 – 2023-02-19 (×2): 10 mg via INTRAVENOUS
  Filled 2023-02-14 (×2): qty 1

## 2023-02-14 MED ORDER — ATORVASTATIN CALCIUM 40 MG PO TABS
40.0000 mg | ORAL_TABLET | Freq: Every day | ORAL | Status: DC
  Administered 2023-02-14 – 2023-02-19 (×6): 40 mg via ORAL
  Filled 2023-02-14 (×6): qty 1

## 2023-02-14 MED ORDER — ONDANSETRON HCL 4 MG/2ML IJ SOLN
4.0000 mg | Freq: Once | INTRAMUSCULAR | Status: AC
  Administered 2023-02-14: 4 mg via INTRAVENOUS
  Filled 2023-02-14: qty 2

## 2023-02-14 NOTE — ED Provider Notes (Signed)
[place] EMERGENCY DEPARTMENT AT [facility] Provider Note   CSN: [id number] Arrival date & time: 02/14/23  4098     History  Chief Complaint  Patient presents with   Cough    Michael [name] is a 72 y.o. male with HIV, HTN, asthma, CAD status post CABG, CKD stage III, T2DM, fibromyalgia, hepatitis C, paroxysmal A-fib, history of osteomyelitis, IDA, HFpEF, homelessness who presents with cough.   Per chart review patient has frequent presentations to the emergency department, 9 presentations in the month of April 2024 alone.  Patient recently presented on 02/10/2023 for anterior chest pain since having a CABG in the setting of cocaine use.  Had a chest x-ray at that time that noted prior cardiac surgery, prior C-spine fusion, otherwise no active cardiopulmonary disease.  Had troponin bump likely related to CAD and was discharged with outpatient follow-up.  Patient reports he has been dealing with a cough for a month.  He has not mentioned at prior visits because it was not that severe but over the last couple of days it has been worsening.  He is productive of thick white phlegm.  Denies any hemoptysis.  Associated with nausea vomiting and subjective fevers and chills.  He also endorses mild allover abdominal pain and diffuse body pain.  Denies any urinary symptoms, diarrhea/constipation, testicular pain.  Endorses ongoing chest pain that is the same as his recent presentation but denies any further cocaine use after his visit on 02/10/2023.  Denies shortness of breath.   HPI     Home Medications Prior to Admission medications   Medication Sig Start Date End Date Taking? Authorizing Provider  albuterol (VENTOLIN HFA) 108 (90 Base) MCG/ACT inhaler Inhale 1-2 puffs into the lungs every 6 (six) hours as needed for wheezing or shortness of breath. Patient not taking: Reported on 01/27/2023 10/24/22   [place], Eric J, DO  aspirin 81 MG chewable tablet Chew by mouth daily. Patient  not taking: Reported on 01/27/2023    [provider]  atorvastatin (LIPITOR) 40 MG tablet Take 1 tablet (40 mg total) by mouth daily. Patient not taking: Reported on 01/27/2023 12/09/22 04/08/23  Sarina Ill, DO  b complex-vitamin c-folic acid (NEPHRO-VITE) 0.8 MG TABS tablet Take 1 tablet by mouth daily. Patient not taking: Reported on 01/27/2023    [provider]  diltiazem (CARDIZEM CD) 180 MG 24 hr capsule Take 1 capsule (180 mg total) by mouth daily. Patient not taking: Reported on 01/27/2023 01/26/23 02/25/23  Jerald Kief, MD  ferrous sulfate 325 (65 FE) MG tablet Take 1 tablet (325 mg total) by mouth daily with breakfast. Patient not taking: Reported on 01/27/2023 12/17/22   Regalado, Jon Billings A, MD  FLUoxetine (PROZAC) 20 MG capsule Take 1 capsule (20 mg total) by mouth daily. Patient not taking: Reported on 01/27/2023 12/17/22   Regalado, Jon Billings A, MD  fluticasone furoate-vilanterol (BREO ELLIPTA) 200-25 MCG/ACT AEPB Inhale 1 puff into the lungs daily. Patient not taking: Reported on 01/27/2023 12/10/22   Sarina Ill, DO  folic acid (FOLVITE) 1 MG tablet Take 1 tablet (1 mg total) by mouth daily. Patient not taking: Reported on 01/27/2023 12/17/22   Regalado, Jon Billings A, MD  furosemide (LASIX) 20 MG tablet Take 1 tablet (20 mg total) by mouth daily for 4 days. Patient not taking: Reported on 02/14/2023 02/02/23 02/06/23  Henderly, Britni A, PA-C  hydrALAZINE (APRESOLINE) 25 MG tablet Take 1 tablet (25 mg total) by mouth every 8 (eight) hours.  Patient not taking: Reported on 01/27/2023 01/25/23 02/24/23  Jerald Kief, MD  isosorbide mononitrate (IMDUR) 30 MG 24 hr tablet Take 0.5 tablets (15 mg total) by mouth daily. Patient not taking: Reported on 01/27/2023 12/10/22   Sarina Ill, DO  nitroGLYCERIN (NITROSTAT) 0.4 MG SL tablet Place 1 tablet (0.4 mg total) under the tongue every 5 (five) minutes as needed for chest pain. Patient not taking: Reported on  01/27/2023 12/09/22   Sarina Ill, DO  pantoprazole (PROTONIX) 40 MG tablet Take 1 tablet (40 mg total) by mouth daily. Patient not taking: Reported on 01/27/2023 12/17/22   Regalado, Jon Billings A, MD  polyethylene glycol (MIRALAX / GLYCOLAX) 17 g packet Take 17 g by mouth 2 (two) times daily. Patient not taking: Reported on 01/27/2023 12/17/22   Regalado, Jon Billings A, MD  QUEtiapine (SEROQUEL) 50 MG tablet Take 1 tablet (50 mg total) by mouth at bedtime. Patient not taking: Reported on 01/27/2023 12/09/22   Sarina Ill, DO  sodium bicarbonate 650 MG tablet Take 1 tablet (650 mg total) by mouth 3 (three) times daily. Patient not taking: Reported on 01/27/2023 12/16/22   Alba Cory, MD      Allergies    Patient has no known allergies.    Review of Systems   Review of Systems Review of systems Positive for subjective fevers/chills.  A 10 point review of systems was performed and is negative unless otherwise reported in HPI.  Physical Exam Updated Vital Signs BP (!) 119/95   Pulse 81   Temp 98.2 F (36.8 C) (Oral)   Resp (!) 29   Ht 5\' 8"  (1.727 m)   Wt 72.6 kg   SpO2 99%   BMI 24.33 kg/m  Physical Exam General: Uncomfortable appearing male, lying in bed.  HEENT: Sclera anicteric, MMM, trachea midline.  Cardiology: RRR, no murmurs/rubs/gallops. BL radial and DP pulses equal bilaterally.  Resp: Normal respiratory rate and effort. CTAB, no wheezes, rhonchi, crackles.  Abd: Soft, non-tender, non-distended. No rebound tenderness or guarding.  GU: Deferred. MSK: 2+ pitting edema in bilateral lower extremities, symmetric, up to knees.  No signs of trauma. Extremities without deformity or TTP. No cyanosis or clubbing. Skin: warm, dry.  Neuro: A&Ox4, CNs II-XII grossly intact. MAEs. Sensation grossly intact.  Psych: Normal mood and affect.   ED Results / Procedures / Treatments   Labs (all labs ordered are listed, but only abnormal results are displayed) Labs Reviewed   CBC WITH DIFFERENTIAL/PLATELET - Abnormal; Notable for the following components:      Result Value   RBC 2.78 (*)    Hemoglobin 7.9 (*)    HCT 25.6 (*)    Platelets 114 (*)    All other components within normal limits  COMPREHENSIVE METABOLIC PANEL - Abnormal; Notable for the following components:   Chloride 117 (*)    CO2 13 (*)    Glucose, Bld 101 (*)    BUN 78 (*)    Creatinine, Ser 3.80 (*)    Calcium 7.9 (*)    Albumin 3.2 (*)    AST 66 (*)    ALT 115 (*)    GFR, Estimated 16 (*)    All other components within normal limits  URINALYSIS, ROUTINE W REFLEX MICROSCOPIC - Abnormal; Notable for the following components:   Color, Urine STRAW (*)    Hgb urine dipstick SMALL (*)    Protein, ur 100 (*)    All other components within normal limits  RAPID  URINE DRUG SCREEN, HOSP PERFORMED - Abnormal; Notable for the following components:   Cocaine POSITIVE (*)    All other components within normal limits  BRAIN NATRIURETIC PEPTIDE - Abnormal; Notable for the following components:   B Natriuretic Peptide 662.2 (*)    All other components within normal limits  RESP PANEL BY RT-PCR (RSV, FLU A&B, COVID)  RVPGX2  T-HELPER CELLS (CD4) COUNT (NOT AT [facility])    EKG EKG Interpretation  Date/Time:  Friday Feb 14 2023 07:26:41 EDT Ventricular Rate:  82 PR Interval:  119 QRS Duration: 70 QT Interval:  376 QTC Calculation: 440 R Axis:   71 Text Interpretation: Sinus rhythm Borderline short PR interval Probable LVH with secondary repol abnrm Similar to prior EKGs Confirmed by Vivi Barrack [phone]) on 02/14/2023 9:42:55 AM  Radiology DG Chest 2 View  Result Date: 02/14/2023 CLINICAL DATA:  Cough EXAM: CHEST - 2 VIEW COMPARISON:  Chest x-ray dated Feb 10, 2023 FINDINGS: Cardiac and mediastinal contours are within normal limits status post median sternotomy. Left atrial occlusion clip. New mild consolidation of the right lower lobe. No evidence of pleural effusion or pneumothorax. IMPRESSION:  New mild consolidation of the right lower lobe, concerning for pneumonia. Electronically Signed   By: Allegra Lai M.D.   On: 02/14/2023 09:57    Procedures Procedures    Medications Ordered in ED Medications  azithromycin (ZITHROMAX) 500 mg in sodium chloride 0.9 % 250 mL IVPB (has no administration in time range)  cefTRIAXone (ROCEPHIN) 1 g in sodium chloride 0.9 % 100 mL IVPB (has no administration in time range)  ondansetron (ZOFRAN) injection 4 mg (4 mg Intravenous Given 02/14/23 1015)  cefTRIAXone (ROCEPHIN) 1 g in sodium chloride 0.9 % 100 mL IVPB (1 g Intravenous New Bag/Given 02/14/23 1058)  furosemide (LASIX) injection 40 mg (40 mg Intravenous Given 02/14/23 1149)    ED Course/ Medical Decision Making/ A&P                          Medical Decision Making Amount and/or Complexity of Data Reviewed Labs: ordered. Decision-making details documented in ED Course. Radiology: ordered. Decision-making details documented in ED Course.  Risk Prescription drug management. Decision regarding hospitalization.    This patient presents to the ED for concern of cough, swelling, this involves an extensive number of treatment options, and is a complaint that carries with it a high risk of complications and morbidity.  I considered the following differential and admission for this acute, potentially life threatening condition.   MDM:    DDX for chest pain includes but is not limited to:  Patient with cough productive of white phlegm subjective fevers and chills at home and chest pain consider pneumonia, bronchitis, lower respiratory infection.  He has bilateral lower extremity pitting edema, lower concern for PE and higher concern for heart failure exacerbation, pulmonary edema, pleural effusion.  Patient was just recently evaluated for the same chest pain, thought to be due to his cocaine use, he has the same pain today and denies any further cocaine use though his UDS is positive.  Lower  concern in clinical context for ACS/arrhythmia, EKG does not demonstrate any arrhythmia or ischemic findings.   Clinical Course as of 02/14/23 1208  Fri Feb 14, 2023  1033 WBC: 7.0 No leukocytosis [HN]  1033 Hemoglobin(!): 7.9 Stable from most recent on 02/10/23 [HN]  1033 Resp panel by RT-PCR (RSV, Flu A&B, Covid) Anterior Nasal Swab Neg [HN]  1033 Urinalysis, Routine w reflex microscopic -Urine, Clean Catch(!) Unmreakrable in context of presentaiton [HN]  1033 DG Chest 2 View FINDINGS: Cardiac and mediastinal contours are within normal limits status post median sternotomy. Left atrial occlusion clip. New mild consolidation of the right lower lobe. No evidence of pleural effusion or pneumothorax.  IMPRESSION: New mild consolidation of the right lower lobe, concerning for pneumonia.   [HN]  1038 Pneumonia severity index 102 points, risk class IV, recommends admission. Patient with HIV noncompliant w/ HIV meds, of course consider atypical infection as well, will order new CD4 count. Last was 296 in 09/2022. [HN]  1130 COCAINE(!): POSITIVE [HN]  1130 B Natriuretic Peptide(!): 662.2 [HN]  1132 Creatinine(!): 3.80 AKI on CKD [HN]  1132 B Natriuretic Peptide(!): 662.2 Mild elevation in BNP from prior in 300s. Pt with pitting edema in BL LEs. Likely will benefit from lasix. No pulm edema or hypoxia.  [HN]  1133 Consulted to hospitalist for admission [HN]    Clinical Course User Index [HN] Loetta Rough, MD    Labs: I Ordered, and personally interpreted labs.  The pertinent results include: Those listed above  Imaging Studies ordered: I ordered imaging studies including chest x-ray I independently visualized and interpreted imaging. I agree with the radiologist interpretation  Additional history obtained from chart review.    Reevaluation: After the interventions noted above, I reevaluated the patient and found that they have :stayed the same  Social Determinants of  Health: Homeless  Disposition:  Admit to hospital  Co morbidities that complicate the patient evaluation  Past Medical History:  Diagnosis Date   A-fib ([name])    Anemia    Asthma    No PFTs, history of childhood asthma   CAD (coronary artery disease)    Cellulitis 04/2014   left facial   Cellulitis and abscess of toe of right foot 12/08/2019   Chondromalacia of medial femoral condyle    Left knee MRI 04/28/12: Chondromalacia of the medial femoral condyle with slight peripheral degeneration of the meniscocapsular junction of the medial meniscus; followed by sports medicine   Chronic kidney failure, stage 4 (severe) ([name])    Collagen vascular disease ([name])    Crack cocaine use    for 20+ years, has been enrolled in detox programs in the past   Depression    with history of hospitalization for suicidal ideation   Diabetes mellitus 2002   Diagnosed in 2002, started insulin in 2012   Gout    Headache(784.0)    CT head 08/2011: Periventricular and subcortical white matter hypodensities are most in keeping with chronic microangiopathic change   HIV infection ([name]) 08/2011   Followed by Dr. Ninetta Lights   Hyperlipidemia    Hypertension    Pulmonary embolism ([name])      Medicines Meds ordered this encounter  Medications   ondansetron (ZOFRAN) injection 4 mg   cefTRIAXone (ROCEPHIN) 1 g in sodium chloride 0.9 % 100 mL IVPB    Order Specific Question:   Antibiotic Indication:    Answer:   CAP   azithromycin (ZITHROMAX) 500 mg in sodium chloride 0.9 % 250 mL IVPB    Order Specific Question:   Antibiotic Indication:    Answer:   CAP   furosemide (LASIX) injection 40 mg   cefTRIAXone (ROCEPHIN) 1 g in sodium chloride 0.9 % 100 mL IVPB    Order Specific Question:   Antibiotic Indication:    Answer:   CAP   aspirin chewable tablet 81  mg   atorvastatin (LIPITOR) tablet 40 mg   diltiazem (CARDIZEM CD) 24 hr capsule 180 mg   isosorbide mononitrate (IMDUR) 24 hr tablet 15 mg   QUEtiapine  (SEROQUEL) tablet 50 mg   pantoprazole (PROTONIX) EC tablet 40 mg   enoxaparin (LOVENOX) injection 30 mg   OR Linked Order Group    acetaminophen (TYLENOL) tablet 650 mg    acetaminophen (TYLENOL) suppository 650 mg   traZODone (DESYREL) tablet 25 mg   docusate sodium (COLACE) capsule 100 mg   polyethylene glycol (MIRALAX / GLYCOLAX) packet 17 g   OR Linked Order Group    ondansetron (ZOFRAN) tablet 4 mg    ondansetron (ZOFRAN) injection 4 mg   albuterol (PROVENTIL) (2.5 MG/3ML) 0.083% nebulizer solution 2.5 mg   hydrALAZINE (APRESOLINE) injection 10 mg    I have reviewed the patients home medicines and have made adjustments as needed  Problem List / ED Course: Problem List Items Addressed This Visit       Cardiovascular and Mediastinum   CHF (congestive heart failure) ([name])   Relevant Medications   aspirin chewable tablet 81 mg (Start on 02/14/2023  2:00 PM)   atorvastatin (LIPITOR) tablet 40 mg (Start on 02/14/2023  2:00 PM)   diltiazem (CARDIZEM CD) 24 hr capsule 180 mg (Start on 02/14/2023  2:00 PM)   isosorbide mononitrate (IMDUR) 24 hr tablet 15 mg (Start on 02/14/2023  2:00 PM)   enoxaparin (LOVENOX) injection 30 mg (Start on 02/14/2023  6:00 PM)   hydrALAZINE (APRESOLINE) injection 10 mg     Respiratory   * (Principal) CAP (community acquired pneumonia) - Primary   Relevant Medications   azithromycin (ZITHROMAX) 500 mg in sodium chloride 0.9 % 250 mL IVPB   cefTRIAXone (ROCEPHIN) 1 g in sodium chloride 0.9 % 100 mL IVPB (Start on 02/15/2023  9:00 AM)   albuterol (PROVENTIL) (2.5 MG/3ML) 0.083% nebulizer solution 2.5 mg     Genitourinary   AKI (acute kidney injury) ([name])                This note was created using dictation software, which may contain spelling or grammatical errors.    Loetta Rough, MD 02/14/23 [phone]

## 2023-02-14 NOTE — H&P (Addendum)
History and Physical  [name]:[id number] DOB: [birth year] DOA: 02/14/2023  PCP: [name], MD   Chief Complaint: Cough  HPI: Michael [name] is a 72 y.o. male with medical history significant for HIV, homelessness, ongoing cocaine abuse, paroxysmal atrial fibrillation, stage IV, CAD status post CABG, insulin-dependent type 2 diabetes who is being admitted to the hospital with community-acquired pneumonia.  Patient is well-known to our system, as he frequents the ER with a plethora of complaints.  This is his 11th visit to the emergency department in the last 5 weeks.  He often presents to the emergency department complaining of being suicidal, of having chest pain, and his story often changes during his ER visits.  Today he presents with complaints of cough for the last month, though during my interview he states that he has been coughing for the last 2 weeks.  Says that he has been having some fevers and chills, and that cough is productive of thick white sputum.  He does not take any of his prescribed medications, and urine drug screen is once again positive for cocaine.  ED Course: Evaluation in the emergency department reveals normal vital signs except for elevated blood pressure which is fluctuated from as high as 191/100 to 119/95.  Lab work reveals stable anemia, no leukocytosis, creatinine slightly elevated to 3.8 from baseline of 3.4.  Chest x-ray reveals possibility of right lower lobe pneumonia, patient was given IV azithromycin and Rocephin.  Hospitalist was contacted for admission.  After patient was admitted to the fifth floor, he mentioned to nursing staff that he feels suicidal.  Review of Systems: Please see HPI for pertinent positives and negatives. A complete 10 system review of systems are otherwise negative.  Past Medical History:  Diagnosis Date   A-fib ([name])    Anemia    Asthma    No PFTs, history of childhood asthma   CAD (coronary artery disease)     Cellulitis 04/2014   left facial   Cellulitis and abscess of toe of right foot 12/08/2019   Chondromalacia of medial femoral condyle    Left knee MRI 04/28/12: Chondromalacia of the medial femoral condyle with slight peripheral degeneration of the meniscocapsular junction of the medial meniscus; followed by sports medicine   Chronic kidney failure, stage 4 (severe) ([name])    Collagen vascular disease ([name])    Crack cocaine use    for 20+ years, has been enrolled in detox programs in the past   Depression    with history of hospitalization for suicidal ideation   Diabetes mellitus 2002   Diagnosed in 2002, started insulin in 2012   Gout    Headache(784.0)    CT head 08/2011: Periventricular and subcortical white matter hypodensities are most in keeping with chronic microangiopathic change   HIV infection ([name]) 08/2011   Followed by Dr. Ninetta Lights   Hyperlipidemia    Hypertension    Pulmonary embolism [facility])    Past Surgical History:  Procedure Laterality Date   AMPUTATION Right 07/21/2019   Procedure: RIGHT SECOND TOE AMPUTATION;  Surgeon: Nadara Mustard, MD;  Location: [facility] OR;  Service: Orthopedics;  Laterality: Right;   BACK SURGERY     1988   BOWEL RESECTION     CARDIAC SURGERY     CERVICAL SPINE SURGERY     " rods in my neck "   CORONARY ARTERY BYPASS GRAFT     CORONARY STENT PLACEMENT     NM MYOCAR PERF WALL  MOTION  12/27/2011   normal   SPINE SURGERY      Social History:  reports that he has never smoked. He has never used smokeless tobacco. He reports that he does not currently use drugs after having used the following drugs: "Crack" cocaine and Cocaine. Frequency: 4.00 times per week. He reports that he does not drink alcohol.   No Known Allergies  Family History  Problem Relation Age of Onset   Diabetes Mother    Hypertension Mother    Hyperlipidemia Mother    Diabetes Father    Cancer Father    Hypertension Father    Diabetes Brother    Heart disease Brother     Diabetes Sister    Colon cancer Neg Hx      Prior to Admission medications   Medication Sig Start Date End Date Taking? Authorizing Provider  albuterol (VENTOLIN HFA) 108 (90 Base) MCG/ACT inhaler Inhale 1-2 puffs into the lungs every 6 (six) hours as needed for wheezing or shortness of breath. Patient not taking: Reported on 01/27/2023 10/24/22   [place], Eric J, DO  aspirin 81 MG chewable tablet Chew by mouth daily. Patient not taking: Reported on 01/27/2023    [provider]  atorvastatin (LIPITOR) 40 MG tablet Take 1 tablet (40 mg total) by mouth daily. Patient not taking: Reported on 01/27/2023 12/09/22 04/08/23  Sarina Ill, DO  b complex-vitamin c-folic acid (NEPHRO-VITE) 0.8 MG TABS tablet Take 1 tablet by mouth daily. Patient not taking: Reported on 01/27/2023    [provider]  diltiazem (CARDIZEM CD) 180 MG 24 hr capsule Take 1 capsule (180 mg total) by mouth daily. Patient not taking: Reported on 01/27/2023 01/26/23 02/25/23  Jerald Kief, MD  ferrous sulfate 325 (65 FE) MG tablet Take 1 tablet (325 mg total) by mouth daily with breakfast. Patient not taking: Reported on 01/27/2023 12/17/22   Regalado, Jon Billings A, MD  FLUoxetine (PROZAC) 20 MG capsule Take 1 capsule (20 mg total) by mouth daily. Patient not taking: Reported on 01/27/2023 12/17/22   Regalado, Jon Billings A, MD  fluticasone furoate-vilanterol (BREO ELLIPTA) 200-25 MCG/ACT AEPB Inhale 1 puff into the lungs daily. Patient not taking: Reported on 01/27/2023 12/10/22   Sarina Ill, DO  folic acid (FOLVITE) 1 MG tablet Take 1 tablet (1 mg total) by mouth daily. Patient not taking: Reported on 01/27/2023 12/17/22   Regalado, Jon Billings A, MD  furosemide (LASIX) 20 MG tablet Take 1 tablet (20 mg total) by mouth daily for 4 days. Patient not taking: Reported on 02/14/2023 02/02/23 02/06/23  Henderly, Britni A, PA-C  hydrALAZINE (APRESOLINE) 25 MG tablet Take 1 tablet (25 mg total) by mouth every 8 (eight)  hours. Patient not taking: Reported on 01/27/2023 01/25/23 02/24/23  Jerald Kief, MD  isosorbide mononitrate (IMDUR) 30 MG 24 hr tablet Take 0.5 tablets (15 mg total) by mouth daily. Patient not taking: Reported on 01/27/2023 12/10/22   Sarina Ill, DO  nitroGLYCERIN (NITROSTAT) 0.4 MG SL tablet Place 1 tablet (0.4 mg total) under the tongue every 5 (five) minutes as needed for chest pain. Patient not taking: Reported on 01/27/2023 12/09/22   Sarina Ill, DO  pantoprazole (PROTONIX) 40 MG tablet Take 1 tablet (40 mg total) by mouth daily. Patient not taking: Reported on 01/27/2023 12/17/22   Regalado, Jon Billings A, MD  polyethylene glycol (MIRALAX / GLYCOLAX) 17 g packet Take 17 g by mouth 2 (two) times daily. Patient not taking: Reported on 01/27/2023 12/17/22  Regalado, Belkys A, MD  QUEtiapine (SEROQUEL) 50 MG tablet Take 1 tablet (50 mg total) by mouth at bedtime. Patient not taking: Reported on 01/27/2023 12/09/22   Sarina Ill, DO  sodium bicarbonate 650 MG tablet Take 1 tablet (650 mg total) by mouth 3 (three) times daily. Patient not taking: Reported on 01/27/2023 12/16/22   Alba Cory, MD    Physical Exam: BP (!) 165/97   Pulse 79   Temp 98.3 F (36.8 C) (Oral)   Resp (!) 22   Ht 5\' 8"  (1.727 m)   Wt 72.6 kg   SpO2 100%   BMI 24.33 kg/m   General:  Alert, oriented, calm, in no acute distress, disheveled and chronically ill-appearing gentleman.  No cough during my exam, speaking in full sentences. Eyes: EOMI, clear conjuctivae, white sclerea Neck: supple, no masses, trachea mildline  Cardiovascular: RRR, no murmurs or rubs, he has pitting 2+ bilateral lower extremity edema up to the mid thigh Respiratory: clear to auscultation bilaterally, no wheezes, no crackles  Abdomen: soft, nontender, nondistended, normal bowel tones heard  Skin: dry, no rashes  Musculoskeletal: no joint effusions, normal range of motion  Psychiatric: appropriate affect,  normal speech  Neurologic: extraocular muscles intact, clear speech, moving all extremities with intact sensorium          Labs on Admission:  Basic Metabolic Panel: Recent Labs  Lab 02/10/23 1102 02/14/23 0959  NA 139 142  K 4.7 5.0  CL 113* 117*  CO2 17* 13*  GLUCOSE 159* 101*  BUN 64* 78*  CREATININE 3.39* 3.80*  CALCIUM 8.0* 7.9*   Liver Function Tests: Recent Labs  Lab 02/14/23 0959  AST 66*  ALT 115*  ALKPHOS 101  BILITOT 0.3  PROT 6.8  ALBUMIN 3.2*   No results for input(s): "LIPASE", "AMYLASE" in the last 168 hours. No results for input(s): "AMMONIA" in the last 168 hours. CBC: Recent Labs  Lab 02/10/23 1102 02/14/23 0959  WBC 3.9* 7.0  NEUTROABS  --  5.5  HGB 7.9* 7.9*  HCT 25.1* 25.6*  MCV 90.0 92.1  PLT 109* 114*   Cardiac Enzymes: No results for input(s): "CKTOTAL", "CKMB", "CKMBINDEX", "TROPONINI" in the last 168 hours.  BNP (last 3 results) Recent Labs    07/02/22 0833 02/02/23 1610 02/14/23 0959  BNP 169.9* 368.1* 662.2*    ProBNP (last 3 results) No results for input(s): "PROBNP" in the last 8760 hours.  CBG: No results for input(s): "GLUCAP" in the last 168 hours.  Radiological Exams on Admission: DG Chest 2 View  Result Date: 02/14/2023 CLINICAL DATA:  Cough EXAM: CHEST - 2 VIEW COMPARISON:  Chest x-ray dated Feb 10, 2023 FINDINGS: Cardiac and mediastinal contours are within normal limits status post median sternotomy. Left atrial occlusion clip. New mild consolidation of the right lower lobe. No evidence of pleural effusion or pneumothorax. IMPRESSION: New mild consolidation of the right lower lobe, concerning for pneumonia. Electronically Signed   By: Allegra Lai M.D.   On: 02/14/2023 09:57    Assessment/Plan CAP (community acquired pneumonia)-not septic, stable on room air -Continue empiric IV azithromycin and Rocephin -Albuterol as needed cough/shortness of breath    Suicidal ideations-this is a common complaint for  him, he has had several psychiatric consultations but never has a plan.  During his last hospitalization, there was plan for inpatient psychiatric admission once he was medically stabilized, but after several days of medical therapies, he was deemed low risk and discharged from psychiatric care.  Will consult psych once again.    Substance induced mood disorder ([name]) he does not take any of his medications, but uses cocaine and statin.    Type II diabetes mellitus with renal manifestations ([name])-diabetic diet regimen, with sliding scale insulin  Acute on CKD 4-creatinine elevated from baseline -Avoid nephrotoxins as able -Follow renal function daily  Acute on chronic diastolic congestive heart failure-he is fluid overloaded, likely due to medication and diet noncompliance, and uncontrolled hypertension. -Will resume cardiac medications as noted below -Given a dose of IV Lasix in the emergency department, will plan to repeat dose in the morning based on fluid status and renal function  Uncontrolled hypertension-patient is completely noncompliant with medications, will resume his care and Cardizem while he is in the hospital  Hyperlipidemia-continue statin  CAD status post CABG-continue aspirin  DVT prophylaxis: Lovenox     Code Status: Full Code  Consults called: None  Admission status: Observation  Time spent: 48 minutes  Kiki Bivens Sharlette Dense MD [facility] Pager [phone]  If 7PM-7AM, please contact night-coverage [email or web address] Password [facility]  02/14/2023, 11:48 AM

## 2023-02-14 NOTE — ED Triage Notes (Signed)
Patient brought in from [facility] by EMS with c/o cough. Patient states he has had cough for a month. Cough is productive, white thick phlegm along with N/V. 180/96 74 100% RA CBG:113

## 2023-02-15 ENCOUNTER — Encounter (HOSPITAL_COMMUNITY): Payer: Self-pay | Admitting: Internal Medicine

## 2023-02-15 DIAGNOSIS — I5033 Acute on chronic diastolic (congestive) heart failure: Secondary | ICD-10-CM | POA: Diagnosis present

## 2023-02-15 DIAGNOSIS — Z21 Asymptomatic human immunodeficiency virus [HIV] infection status: Secondary | ICD-10-CM | POA: Diagnosis present

## 2023-02-15 DIAGNOSIS — I130 Hypertensive heart and chronic kidney disease with heart failure and stage 1 through stage 4 chronic kidney disease, or unspecified chronic kidney disease: Secondary | ICD-10-CM | POA: Diagnosis present

## 2023-02-15 DIAGNOSIS — Z955 Presence of coronary angioplasty implant and graft: Secondary | ICD-10-CM | POA: Diagnosis not present

## 2023-02-15 DIAGNOSIS — E872 Acidosis, unspecified: Secondary | ICD-10-CM | POA: Diagnosis present

## 2023-02-15 DIAGNOSIS — J189 Pneumonia, unspecified organism: Secondary | ICD-10-CM | POA: Diagnosis present

## 2023-02-15 DIAGNOSIS — N179 Acute kidney failure, unspecified: Secondary | ICD-10-CM | POA: Diagnosis present

## 2023-02-15 DIAGNOSIS — I480 Paroxysmal atrial fibrillation: Secondary | ICD-10-CM | POA: Diagnosis present

## 2023-02-15 DIAGNOSIS — R45851 Suicidal ideations: Secondary | ICD-10-CM | POA: Diagnosis present

## 2023-02-15 DIAGNOSIS — Z951 Presence of aortocoronary bypass graft: Secondary | ICD-10-CM | POA: Diagnosis not present

## 2023-02-15 DIAGNOSIS — I2510 Atherosclerotic heart disease of native coronary artery without angina pectoris: Secondary | ICD-10-CM | POA: Diagnosis present

## 2023-02-15 DIAGNOSIS — Z79899 Other long term (current) drug therapy: Secondary | ICD-10-CM | POA: Diagnosis not present

## 2023-02-15 DIAGNOSIS — F1994 Other psychoactive substance use, unspecified with psychoactive substance-induced mood disorder: Secondary | ICD-10-CM | POA: Diagnosis not present

## 2023-02-15 DIAGNOSIS — D696 Thrombocytopenia, unspecified: Secondary | ICD-10-CM | POA: Diagnosis present

## 2023-02-15 DIAGNOSIS — F332 Major depressive disorder, recurrent severe without psychotic features: Secondary | ICD-10-CM | POA: Diagnosis present

## 2023-02-15 DIAGNOSIS — R059 Cough, unspecified: Secondary | ICD-10-CM | POA: Diagnosis present

## 2023-02-15 DIAGNOSIS — Z5900 Homelessness unspecified: Secondary | ICD-10-CM | POA: Diagnosis not present

## 2023-02-15 DIAGNOSIS — F1420 Cocaine dependence, uncomplicated: Secondary | ICD-10-CM | POA: Diagnosis not present

## 2023-02-15 DIAGNOSIS — Z1152 Encounter for screening for COVID-19: Secondary | ICD-10-CM | POA: Diagnosis not present

## 2023-02-15 DIAGNOSIS — E1122 Type 2 diabetes mellitus with diabetic chronic kidney disease: Secondary | ICD-10-CM | POA: Diagnosis present

## 2023-02-15 DIAGNOSIS — N184 Chronic kidney disease, stage 4 (severe): Secondary | ICD-10-CM | POA: Diagnosis present

## 2023-02-15 DIAGNOSIS — E785 Hyperlipidemia, unspecified: Secondary | ICD-10-CM | POA: Diagnosis present

## 2023-02-15 DIAGNOSIS — Z8249 Family history of ischemic heart disease and other diseases of the circulatory system: Secondary | ICD-10-CM | POA: Diagnosis not present

## 2023-02-15 DIAGNOSIS — M797 Fibromyalgia: Secondary | ICD-10-CM | POA: Diagnosis present

## 2023-02-15 DIAGNOSIS — F1424 Cocaine dependence with cocaine-induced mood disorder: Secondary | ICD-10-CM | POA: Diagnosis present

## 2023-02-15 DIAGNOSIS — J45909 Unspecified asthma, uncomplicated: Secondary | ICD-10-CM | POA: Diagnosis present

## 2023-02-15 DIAGNOSIS — D631 Anemia in chronic kidney disease: Secondary | ICD-10-CM | POA: Diagnosis present

## 2023-02-15 LAB — TYPE AND SCREEN
Antibody Screen: NEGATIVE
Unit division: 0

## 2023-02-15 LAB — GLUCOSE, CAPILLARY
Glucose-Capillary: 95 mg/dL (ref 70–99)
Glucose-Capillary: 147 mg/dL — ABNORMAL HIGH (ref 70–99)
Glucose-Capillary: 113 mg/dL — ABNORMAL HIGH (ref 70–99)
Glucose-Capillary: 146 mg/dL — ABNORMAL HIGH (ref 70–99)

## 2023-02-15 LAB — CBC
HCT: 20 % — ABNORMAL LOW (ref 39.0–52.0)
Hemoglobin: 6.1 g/dL — CL (ref 13.0–17.0)
MCH: 28.6 pg (ref 26.0–34.0)
MCHC: 30.5 g/dL (ref 30.0–36.0)
MCV: 93.9 fL (ref 80.0–100.0)
Platelets: 105 10*3/uL — ABNORMAL LOW (ref 150–400)
RBC: 2.13 MIL/uL — ABNORMAL LOW (ref 4.22–5.81)
RDW: 14.2 % (ref 11.5–15.5)
WBC: 5.4 10*3/uL (ref 4.0–10.5)
nRBC: 0 % (ref 0.0–0.2)

## 2023-02-15 LAB — BASIC METABOLIC PANEL
Anion gap: 8 (ref 5–15)
BUN: 78 mg/dL — ABNORMAL HIGH (ref 8–23)
CO2: 16 mmol/L — ABNORMAL LOW (ref 22–32)
Calcium: 7.5 mg/dL — ABNORMAL LOW (ref 8.9–10.3)
Chloride: 116 mmol/L — ABNORMAL HIGH (ref 98–111)
Creatinine, Ser: 4.04 mg/dL — ABNORMAL HIGH (ref 0.61–1.24)
GFR, Estimated: 15 mL/min — ABNORMAL LOW (ref >60)
Glucose, Bld: 122 mg/dL — ABNORMAL HIGH (ref 70–99)
Potassium: 4.5 mmol/L (ref 3.5–5.1)
Sodium: 140 mmol/L (ref 135–145)

## 2023-02-15 LAB — PREPARE RBC (CROSSMATCH)

## 2023-02-15 MED ORDER — FLUTICASONE PROPIONATE 50 MCG/ACT NA SUSP
2.0000 | Freq: Every day | NASAL | Status: DC
  Administered 2023-02-15 – 2023-02-18 (×4): 2 via NASAL
  Filled 2023-02-15: qty 16

## 2023-02-15 MED ORDER — SODIUM BICARBONATE 650 MG PO TABS
650.0000 mg | ORAL_TABLET | Freq: Three times a day (TID) | ORAL | Status: DC
  Administered 2023-02-15 – 2023-02-19 (×13): 650 mg via ORAL
  Filled 2023-02-15 (×13): qty 1

## 2023-02-15 MED ORDER — DULOXETINE HCL 30 MG PO CPEP
30.0000 mg | ORAL_CAPSULE | Freq: Every day | ORAL | Status: DC
  Administered 2023-02-15 – 2023-02-19 (×5): 30 mg via ORAL
  Filled 2023-02-15 (×5): qty 1

## 2023-02-15 MED ORDER — SODIUM CHLORIDE 0.9% IV SOLUTION: Freq: Once | INTRAVENOUS | Status: DC

## 2023-02-15 MED ORDER — GUAIFENESIN-DM 100-10 MG/5ML PO SYRP
5.0000 mL | ORAL_SOLUTION | ORAL | Status: DC | PRN
  Administered 2023-02-15 – 2023-02-19 (×5): 5 mL via ORAL
  Filled 2023-02-15 (×6): qty 5

## 2023-02-15 MED ORDER — FUROSEMIDE 10 MG/ML IJ SOLN
40.0000 mg | Freq: Once | INTRAMUSCULAR | Status: AC
  Administered 2023-02-15: 40 mg via INTRAVENOUS
  Filled 2023-02-15: qty 4

## 2023-02-15 NOTE — Consult Note (Signed)
[facility] Face-to-Face Psychiatry Consult   Reason for Consult:'' suicidal ideation with no plan.'' Referring Physician:  Pamella Pert, MD Patient Identification: Michael [name] MRN:  [id number] Principal Diagnosis: CAP (community acquired pneumonia) Diagnosis:  Principal Problem:   CAP (community acquired pneumonia) Active Problems:   Suicidal ideations   Major depressive disorder, recurrent, severe without psychotic features ([name])   Cocaine use disorder, severe, dependence ([name])   Substance induced mood disorder ([name])   Type II diabetes mellitus with renal manifestations ([name])   AKI (acute kidney injury) ([name])   Total Time spent with patient: 1 hour  Subjective:   Michael [name] is a 72 y.o. male patient admitted with cough and suicidal ideations.  HPI:  72 year old male with history of HIV, homelessness, Cocaine use disorder-severe, PAF, CKD stage IV, CAD status post CABG, IDDM and Major depressive disorder who was admitted to the hospital for the treatment of community-acquired pneumonia as well as suicidal ideation with no plan. Patient is well known to the psychiatric service due to frequent ED hospital visit for suicidal thoughts. Today, he reports worsening depressive symptoms characterized  low energy level, hopelessness, anhedonia, feeling of worthlessness and suicidal ideations with no specific plan. He reports being homeless, non-compliant with his medication and self medicating with cocaine. He reports that Cymbalta and Seroquel were prescribed for him during his last inpatient psych admission which worked well for him. He denies psychosis, delusions, homicidal thoughts but requesting for psychiatric inpatient admission for stabilization.  Past Psychiatric History: as above  Risk to Self:  passive SI Risk to Others:  denies Prior Inpatient Therapy:  yes, last admission, April, 2024 Prior Outpatient Therapy:  non-compliant  Past Medical History:  Past Medical  History:  Diagnosis Date   A-fib ([name])    Anemia    Asthma    No PFTs, history of childhood asthma   CAD (coronary artery disease)    Cellulitis 04/2014   left facial   Cellulitis and abscess of toe of right foot 12/08/2019   Chondromalacia of medial femoral condyle    Left knee MRI 04/28/12: Chondromalacia of the medial femoral condyle with slight peripheral degeneration of the meniscocapsular junction of the medial meniscus; followed by sports medicine   Chronic kidney failure, stage 4 (severe) ([name])    Collagen vascular disease ([name])    Crack cocaine use    for 20+ years, has been enrolled in detox programs in the past   Depression    with history of hospitalization for suicidal ideation   Diabetes mellitus 2002   Diagnosed in 2002, started insulin in 2012   Gout    Headache(784.0)    CT head 08/2011: Periventricular and subcortical white matter hypodensities are most in keeping with chronic microangiopathic change   HIV infection ([name]) 08/2011   Followed by Dr. Ninetta Lights   Hyperlipidemia    Hypertension    Pulmonary embolism [facility])     Past Surgical History:  Procedure Laterality Date   AMPUTATION Right 07/21/2019   Procedure: RIGHT SECOND TOE AMPUTATION;  Surgeon: Nadara Mustard, MD;  Location: [facility] OR;  Service: Orthopedics;  Laterality: Right;   BACK SURGERY     1988   BOWEL RESECTION     CARDIAC SURGERY     CERVICAL SPINE SURGERY     " rods in my neck "   CORONARY ARTERY BYPASS GRAFT     CORONARY STENT PLACEMENT     NM MYOCAR PERF WALL MOTION  12/27/2011   normal  SPINE SURGERY     Family History:  Family History  Problem Relation Age of Onset   Diabetes Mother    Hypertension Mother    Hyperlipidemia Mother    Diabetes Father    Cancer Father    Hypertension Father    Diabetes Brother    Heart disease Brother    Diabetes Sister    Colon cancer Neg Hx    Family Psychiatric  History:   Social History:  Social History   Substance and Sexual Activity   Alcohol Use No   Alcohol/week: 4.0 standard drinks of alcohol   Types: 2 Cans of beer, 2 Shots of liquor per week     Social History   Substance and Sexual Activity  Drug Use Not Currently   Frequency: 4.0 times per week   Types: "Crack" cocaine, Cocaine   Comment: Recent use of crack    Social History   Socioeconomic History   Marital status: Widowed    Spouse name: Not on file   Number of children: 2   Years of education: 65   Highest education level: 12th grade  Occupational History    Employer: UNEMPLOYED    Comment: 04/2016  Tobacco Use   Smoking status: Never   Smokeless tobacco: Never  Vaping Use   Vaping Use: Never used  Substance and Sexual Activity   Alcohol use: No    Alcohol/week: 4.0 standard drinks of alcohol    Types: 2 Cans of beer, 2 Shots of liquor per week   Drug use: Not Currently    Frequency: 4.0 times per week    Types: "Crack" cocaine, Cocaine    Comment: Recent use of crack   Sexual activity: Yes    Comment: DECLINED CONDOMS  Other Topics Concern   Not on file  Social History Narrative   Currently staying with a friend in [place].  Was staying @ local motel until a few days ago - left b/c of bed bugs.   Picked up from Extended Stay.  Not followed by a psychiatrist.   Social Determinants of Health   Financial Resource Strain: Medium Risk (09/16/2018)   Overall Financial Resource Strain (CARDIA)    Difficulty of Paying Living Expenses: Somewhat hard  Food Insecurity: No Food Insecurity (02/14/2023)   Hunger Vital Sign    Worried About Running Out of Food in the Last Year: Never true    Ran Out of Food in the Last Year: Never true  Recent Concern: Food Insecurity - Food Insecurity Present (01/17/2023)   Hunger Vital Sign    Worried About Programme researcher, broadcasting/film/video in the Last Year: Often true    Ran Out of Food in the Last Year: Often true  Transportation Needs: Unmet Transportation Needs (02/14/2023)   PRAPARE - Scientist, research (physical sciences) (Medical): Yes    Lack of Transportation (Non-Medical): Yes  Physical Activity: Unknown (04/22/2019)   Exercise Vital Sign    Days of Exercise per Week: Not on file    Minutes of Exercise per Session: 0 min  Stress: Stress Concern Present (09/16/2018)   [employer] of Occupational Health - Occupational Stress Questionnaire    Feeling of Stress : Very much  Social Connections: Socially Isolated (09/16/2018)   Social Connection and Isolation Panel [NHANES]    Frequency of Communication with Friends and Family: Once a week    Frequency of Social Gatherings with Friends and Family: Never    Attends Religious Services: Never  Active Member of Clubs or Organizations: No    Attends Banker Meetings: Never    Marital Status: Widowed   Additional Social History:    Allergies:  No Known Allergies  Labs:  Results for orders placed or performed during the hospital encounter of 02/14/23 (from the past 48 hour(s))  Resp panel by RT-PCR (RSV, Flu A&B, Covid) Anterior Nasal Swab     Status: None   Collection Time: 02/14/23  8:57 AM   Specimen: Anterior Nasal Swab  Result Value Ref Range   SARS Coronavirus 2 by RT PCR NEGATIVE NEGATIVE    Comment: (NOTE) SARS-CoV-2 target nucleic acids are NOT DETECTED.  The SARS-CoV-2 RNA is generally detectable in upper respiratory specimens during the acute phase of infection. The lowest concentration of SARS-CoV-2 viral copies this assay can detect is 138 copies/mL. A negative result does not preclude SARS-Cov-2 infection and should not be used as the sole basis for treatment or other patient management decisions. A negative result may occur with  improper specimen collection/handling, submission of specimen other than nasopharyngeal swab, presence of viral mutation(s) within the areas targeted by this assay, and inadequate number of viral copies(<138 copies/mL). A negative result must be combined with clinical  observations, patient history, and epidemiological information. The expected result is Negative.  Fact Sheet for Patients:  BloggerCourse.com  Fact Sheet for Healthcare Providers:  SeriousBroker.it  This test is no t yet approved or cleared by the [place] FDA and  has been authorized for detection and/or diagnosis of SARS-CoV-2 by FDA under an Emergency Use Authorization (EUA). This EUA will remain  in effect (meaning this test can be used) for the duration of the COVID-19 declaration under Section 564(b)(1) of the Act, 21 U.S.C.section 360bbb-3(b)(1), unless the authorization is terminated  or revoked sooner.       Influenza A by PCR NEGATIVE NEGATIVE   Influenza B by PCR NEGATIVE NEGATIVE    Comment: (NOTE) The Xpert Xpress SARS-CoV-2/FLU/RSV plus assay is intended as an aid in the diagnosis of influenza from Nasopharyngeal swab specimens and should not be used as a sole basis for treatment. Nasal washings and aspirates are unacceptable for Xpert Xpress SARS-CoV-2/FLU/RSV testing.  Fact Sheet for Patients: BloggerCourse.com  Fact Sheet for Healthcare Providers: SeriousBroker.it  This test is not yet approved or cleared by the [place] FDA and has been authorized for detection and/or diagnosis of SARS-CoV-2 by FDA under an Emergency Use Authorization (EUA). This EUA will remain in effect (meaning this test can be used) for the duration of the COVID-19 declaration under Section 564(b)(1) of the Act, 21 U.S.C. section 360bbb-3(b)(1), unless the authorization is terminated or revoked.     Resp Syncytial Virus by PCR NEGATIVE NEGATIVE    Comment: (NOTE) Fact Sheet for Patients: BloggerCourse.com  Fact Sheet for Healthcare Providers: SeriousBroker.it  This test is not yet approved or cleared by the [place]  FDA and has been authorized for detection and/or diagnosis of SARS-CoV-2 by FDA under an Emergency Use Authorization (EUA). This EUA will remain in effect (meaning this test can be used) for the duration of the COVID-19 declaration under Section 564(b)(1) of the Act, 21 U.S.C. section 360bbb-3(b)(1), unless the authorization is terminated or revoked.  Performed at [facility], [address] [place], [place] [address]   Urinalysis, Routine w reflex microscopic -Urine, Clean Catch     Status: Abnormal   Collection Time: 02/14/23  9:02 AM  Result Value Ref Range  Color, Urine STRAW (A) YELLOW   APPearance CLEAR CLEAR   Specific Gravity, Urine 1.013 1.005 - 1.030   pH 5.0 5.0 - 8.0   Glucose, UA NEGATIVE NEGATIVE mg/dL   Hgb urine dipstick SMALL (A) NEGATIVE   Bilirubin Urine NEGATIVE NEGATIVE   Ketones, ur NEGATIVE NEGATIVE mg/dL   Protein, ur 161 (A) NEGATIVE mg/dL   Nitrite NEGATIVE NEGATIVE   Leukocytes,Ua NEGATIVE NEGATIVE   RBC / HPF 6-10 0 - 5 RBC/hpf   WBC, UA 0-5 0 - 5 WBC/hpf   Bacteria, UA NONE SEEN NONE SEEN   Squamous Epithelial / HPF 0-5 0 - 5 /HPF   Mucus PRESENT     Comment: Performed at [facility], [address], [place], [place] [address]  Rapid urine drug screen (hospital performed)     Status: Abnormal   Collection Time: 02/14/23  9:02 AM  Result Value Ref Range   Opiates NONE DETECTED NONE DETECTED   Cocaine POSITIVE (A) NONE DETECTED   Benzodiazepines NONE DETECTED NONE DETECTED   Amphetamines NONE DETECTED NONE DETECTED   Tetrahydrocannabinol NONE DETECTED NONE DETECTED   Barbiturates NONE DETECTED NONE DETECTED    Comment: (NOTE) DRUG SCREEN FOR MEDICAL PURPOSES ONLY.  IF CONFIRMATION IS NEEDED FOR ANY PURPOSE, NOTIFY LAB WITHIN 5 DAYS.  LOWEST DETECTABLE LIMITS FOR URINE DRUG SCREEN Drug Class                     Cutoff (ng/mL) Amphetamine and metabolites    1000 Barbiturate and metabolites     200 Benzodiazepine                 200 Opiates and metabolites        300 Cocaine and metabolites        300 THC                            50 Performed at [facility], [address], [place], [place] [address]   CBC with Differential     Status: Abnormal   Collection Time: 02/14/23  9:59 AM  Result Value Ref Range   WBC 7.0 4.0 - 10.5 K/uL   RBC 2.78 (L) 4.22 - 5.81 MIL/uL   Hemoglobin 7.9 (L) 13.0 - 17.0 g/dL   HCT 11.9 (L) 14.7 - 82.9 %   MCV 92.1 80.0 - 100.0 fL   MCH 28.4 26.0 - 34.0 pg   MCHC 30.9 30.0 - 36.0 g/dL   RDW 56.2 13.0 - 86.5 %   Platelets 114 (L) 150 - 400 K/uL   nRBC 0.0 0.0 - 0.2 %   Neutrophils Relative % 79 %   Neutro Abs 5.5 1.7 - 7.7 K/uL   Lymphocytes Relative 13 %   Lymphs Abs 0.9 0.7 - 4.0 K/uL   Monocytes Relative 6 %   Monocytes Absolute 0.4 0.1 - 1.0 K/uL   Eosinophils Relative 1 %   Eosinophils Absolute 0.1 0.0 - 0.5 K/uL   Basophils Relative 0 %   Basophils Absolute 0.0 0.0 - 0.1 K/uL   Immature Granulocytes 1 %   Abs Immature Granulocytes 0.06 0.00 - 0.07 K/uL    Comment: Performed at [facility], [address] [place], [place] [address]  Comprehensive metabolic panel     Status: Abnormal   Collection Time: 02/14/23  9:59 AM  Result Value Ref Range   Sodium 142 135 - 145  mmol/L   Potassium 5.0 3.5 - 5.1 mmol/L   Chloride 117 (H) 98 - 111 mmol/L   CO2 13 (L) 22 - 32 mmol/L   Glucose, Bld 101 (H) 70 - 99 mg/dL    Comment: Glucose reference range applies only to samples taken after fasting for at least 8 hours.   BUN 78 (H) 8 - 23 mg/dL   Creatinine, Ser 6.38 (H) 0.61 - 1.24 mg/dL   Calcium 7.9 (L) 8.9 - 10.3 mg/dL   Total Protein 6.8 6.5 - 8.1 g/dL   Albumin 3.2 (L) 3.5 - 5.0 g/dL   AST 66 (H) 15 - 41 U/L   ALT 115 (H) 0 - 44 U/L   Alkaline Phosphatase 101 38 - 126 U/L   Total Bilirubin 0.3 0.3 - 1.2 mg/dL   GFR, Estimated 16 (L) >60 mL/min    Comment: (NOTE) Calculated using the CKD-EPI  Creatinine Equation (2021)    Anion gap 12 5 - 15    Comment: Performed at [facility], [address] [place], [place] [address]  Brain natriuretic peptide     Status: Abnormal   Collection Time: 02/14/23  9:59 AM  Result Value Ref Range   B Natriuretic Peptide 662.2 (H) 0.0 - 100.0 pg/mL    Comment: Performed at [facility], [address], [place], [place] [address]  T-helper cells (CD4) count (not at [facility])     Status: Abnormal   Collection Time: 02/14/23  1:32 PM  Result Value Ref Range   CD4 T Cell Abs 203 (L) 400 - 1,790 /uL   CD4 % Helper T Cell 32 (L) 33 - 65 %    Comment: Performed at [facility], [address], [place], [place] [address]  Glucose, capillary     Status: Abnormal   Collection Time: 02/14/23  4:30 PM  Result Value Ref Range   Glucose-Capillary 168 (H) 70 - 99 mg/dL    Comment: Glucose reference range applies only to samples taken after fasting for at least 8 hours.  Basic metabolic panel     Status: Abnormal   Collection Time: 02/15/23  5:32 AM  Result Value Ref Range   Sodium 140 135 - 145 mmol/L   Potassium 4.5 3.5 - 5.1 mmol/L   Chloride 116 (H) 98 - 111 mmol/L   CO2 16 (L) 22 - 32 mmol/L   Glucose, Bld 122 (H) 70 - 99 mg/dL    Comment: Glucose reference range applies only to samples taken after fasting for at least 8 hours.   BUN 78 (H) 8 - 23 mg/dL   Creatinine, Ser 6.06 (H) 0.61 - 1.24 mg/dL   Calcium 7.5 (L) 8.9 - 10.3 mg/dL   GFR, Estimated 15 (L) >60 mL/min    Comment: (NOTE) Calculated using the CKD-EPI Creatinine Equation (2021)    Anion gap 8 5 - 15    Comment: Performed at [facility], [address] [place], [place] [address]  CBC     Status: Abnormal   Collection Time: 02/15/23  5:32 AM  Result Value Ref Range   WBC 5.4 4.0 - 10.5 K/uL   RBC 2.13 (L) 4.22 - 5.81 MIL/uL   Hemoglobin 6.1 (LL) 13.0 - 17.0 g/dL    Comment: This critical result has verified  and been called to Orlean Bradford, RN by Rozelle Logan on 05 11 2024 at 0612, and has been read back. CRITICAL RESULT VERIFIED   HCT 20.0 (  L) 39.0 - 52.0 %   MCV 93.9 80.0 - 100.0 fL   MCH 28.6 26.0 - 34.0 pg   MCHC 30.5 30.0 - 36.0 g/dL   RDW 16.1 09.6 - 04.5 %   Platelets 105 (L) 150 - 400 K/uL   nRBC 0.0 0.0 - 0.2 %    Comment: Performed at [facility], [address] [place], [place] [address]  Type and screen [facility]     Status: None (Preliminary result)   Collection Time: 02/15/23  8:07 AM  Result Value Ref Range   ABO/RH(D) O POS    Antibody Screen NEG    Sample Expiration 02/18/2023,2359    Unit Number [id number]    Blood Component Type RED CELLS,LR    Unit division 00    Status of Unit ISSUED    Transfusion Status OK TO TRANSFUSE    Crossmatch Result      Compatible Performed at [facility], [address], [place], [place] [address]   Prepare RBC (crossmatch)     Status: None   Collection Time: 02/15/23  8:07 AM  Result Value Ref Range   Order Confirmation      ORDER PROCESSED BY BLOOD BANK Performed at [facility], [address] [place], [place] [address]   Glucose, capillary     Status: None   Collection Time: 02/15/23  8:17 AM  Result Value Ref Range   Glucose-Capillary 95 70 - 99 mg/dL    Comment: Glucose reference range applies only to samples taken after fasting for at least 8 hours.   Comment 1 Notify RN   Glucose, capillary     Status: Abnormal   Collection Time: 02/15/23 12:05 PM  Result Value Ref Range   Glucose-Capillary 147 (H) 70 - 99 mg/dL    Comment: Glucose reference range applies only to samples taken after fasting for at least 8 hours.   Comment 1 Notify RN     Current Facility-Administered Medications  Medication Dose Route Frequency Provider Last Rate Last Admin   0.9 %  sodium chloride infusion (Manually program via Guardrails IV Fluids)   Intravenous Once  Leatha Gilding, MD       acetaminophen (TYLENOL) tablet 650 mg  650 mg Oral Q6H PRN Kirby Crigler, Mir M, MD       Or   acetaminophen (TYLENOL) suppository 650 mg  650 mg Rectal Q6H PRN Kirby Crigler, Mir M, MD       albuterol (PROVENTIL) (2.5 MG/3ML) 0.083% nebulizer solution 2.5 mg  2.5 mg Nebulization Q2H PRN Kirby Crigler, Mir M, MD       aspirin chewable tablet 81 mg  81 mg Oral Daily Kirby Crigler, Mir M, MD   81 mg at 02/15/23 0933   atorvastatin (LIPITOR) tablet 40 mg  40 mg Oral Daily Kirby Crigler, Mir M, MD   40 mg at 02/15/23 0933   azithromycin (ZITHROMAX) 500 mg in sodium chloride 0.9 % 250 mL IVPB  500 mg Intravenous Q24H Kirby Crigler, Mir M, MD   Stopped at 02/15/23 1123   cefTRIAXone (ROCEPHIN) 1 g in sodium chloride 0.9 % 100 mL IVPB  1 g Intravenous Q24H Kirby Crigler, Mir M, MD   Stopped at 02/15/23 1009   diltiazem (CARDIZEM CD) 24 hr capsule 180 mg  180 mg Oral Daily Kirby Crigler, Mir M, MD   180 mg at 02/15/23 0933   docusate sodium (COLACE) capsule 100 mg  100 mg Oral BID Maryln Gottron, MD  100 mg at 02/15/23 0933   DULoxetine (CYMBALTA) DR capsule 30 mg  30 mg Oral Daily Kent Riendeau, MD       furosemide (LASIX) injection 40 mg  40 mg Intravenous Once Leatha Gilding, MD       hydrALAZINE (APRESOLINE) injection 10 mg  10 mg Intravenous Q6H PRN Kirby Crigler, Mir M, MD       insulin aspart (novoLOG) injection 0-15 Units  0-15 Units Subcutaneous TID WC Kirby Crigler, Mir M, MD   2 Units at 02/15/23 1304   insulin aspart (novoLOG) injection 0-5 Units  0-5 Units Subcutaneous QHS Kirby Crigler, Mir M, MD       isosorbide mononitrate (IMDUR) 24 hr tablet 15 mg  15 mg Oral Daily Kirby Crigler, Mir M, MD   15 mg at 02/15/23 0933   ondansetron (ZOFRAN) tablet 4 mg  4 mg Oral Q6H PRN Kirby Crigler, Mir M, MD       Or   ondansetron [facility]) injection 4 mg  4 mg Intravenous Q6H PRN Kirby Crigler, Mir M, MD       pantoprazole (PROTONIX) EC tablet 40 mg  40 mg Oral Daily Kirby Crigler, Mir M, MD   40 mg at  02/15/23 0933   polyethylene glycol (MIRALAX / GLYCOLAX) packet 17 g  17 g Oral Daily PRN Kirby Crigler, Mir M, MD       QUEtiapine (SEROQUEL) tablet 50 mg  50 mg Oral QHS Kirby Crigler, Mir M, MD   50 mg at 02/14/23 2203   sodium bicarbonate tablet 650 mg  650 mg Oral TID Leatha Gilding, MD   650 mg at 02/15/23 1610    Musculoskeletal: Strength & Muscle Tone: within normal limits Gait & Station: normal Patient leans: N/A   Psychiatric Specialty Exam:  Presentation  General Appearance:  Appropriate for Environment  Eye Contact: Minimal  Speech: Clear and Coherent  Speech Volume: Decreased  Handedness: Right   Mood and Affect  Mood: Dysphoric  Affect: Constricted   Thought Process  Thought Processes: Goal Directed  Descriptions of Associations:Intact  Orientation:Full (Time, Place and Person)  Thought Content:Logical  History of Schizophrenia/Schizoaffective disorder:No  Duration of Psychotic Symptoms:No data recorded Hallucinations:Hallucinations: None  Ideas of Reference:None  Suicidal Thoughts:Suicidal Thoughts: Yes, Passive SI Passive Intent and/or Plan: Without Intent; Without Plan  Homicidal Thoughts:Homicidal Thoughts: No   Sensorium  Memory: Immediate Good; Recent Good; Remote Good  Judgment: Intact  Insight: Fair   Art therapist  Concentration: Fair  Attention Span: Fair  Recall: Good  Fund of Knowledge: Good  Language: Good   Psychomotor Activity  Psychomotor Activity: Psychomotor Activity: Decreased   Assets  Assets: Communication Skills; Desire for Improvement   Sleep  Sleep: Sleep: Fair   Physical Exam: Physical Exam Review of Systems  Psychiatric/Behavioral:  Positive for depression, substance abuse and suicidal ideas. Negative for hallucinations and memory loss. The patient is not nervous/anxious and does not have insomnia.    Blood pressure 132/80, pulse 80, temperature 98.2 F (36.8 C),  temperature source Oral, resp. rate 14, height 5\' 8"  (1.727 m), weight 72.6 kg, SpO2 100 %. Body mass index is 24.33 kg/m.  Treatment Plan Summary: Diagnosis:  -Major depressive disorder-recurrent severe without psychosis -Cocaine use disorder-severe  Medication management: -Continue Seroquel 50 mg at bedtime for sleep -Discontinue Trazodone as needed for sleep -Add Cymbalta 30 mg daily for depression   Plan: -Consider TOC Consult to facilitate psychiatric inpatient admission after patient is medically stabilized   Disposition: Recommend psychiatric Inpatient admission when medically  cleared. Supportive therapy provided about ongoing stressors. Psychiatric service will follow the patient  Thedore Mins, MD 02/15/2023 2:08 PM

## 2023-02-15 NOTE — Plan of Care (Signed)

## 2023-02-15 NOTE — Progress Notes (Signed)
PROGRESS NOTE  Michael [name] ZOX:[id number] DOB: July 05, 1951 DOA: 02/14/2023 PCP: [name], MD   LOS: 0 days   Brief Narrative / Interim history: 72 year old male with history of HIV, homelessness, ongoing cocaine abuse, PAF, CKD stage IV, CAD status post CABG, IDDM who comes into the hospital with community-acquired pneumonia as well as suicidal ideation.  He is well-known to our system with frequent ED visits, this being his 11th visit in the past month.  He presents currently with a cough for the last 2 weeks, cough being productive.  He is nonadherent to any of his prescribed medications and continues to have a UDS positive for cocaine.  Subjective / 24h Interval events: This morning patient is complaining of shortness of breath, weakness, as well as lower extremity swelling.  Assesement and Plan: Principal Problem:   CAP (community acquired pneumonia) Active Problems:   Suicidal ideations   Substance induced mood disorder ([name])   Type II diabetes mellitus with renal manifestations ([name])  Principal problem Community-acquired pneumonia -as evidenced by his symptoms as well as a chest x-ray showing right lower lobe consolidation.  Patient has been placed on ceftriaxone and azithromycin, continue  Problems AKI on CKD stage IV -his baseline creatinine appears to be around 3.5.  Creatinine on presentation 3.8, this morning further up at 4.0.  He has evidence of lower extremity edema.  Provide a unit of blood, followed by Lasix.  Closely watch renal function.  Avoid nephrotoxins.  Anemia, multifactorial -likely in the setting of underlying chronic kidney disease, untreated HIV, and also patient reports melena however his story is somewhat vague.  Transfuse unit of packed red blood cells, obtain FOBT, monitor postoperatively.  Following transfusion will follow-up with a dose of Lasix.  Non-anion gap metabolic acidosis -due to renal failure.  Start sodium bicarb  History of HIV  -noncompliant to his home medications.  Needs ID follow-up as an outpatient  Thrombocytopenia -chronic, monitor.  CAD status post CABG -no chest pain.  Unfortunately he is not adhering to any of his home meds  Cocaine use disorder -counseled for cessation  Suicidal ideation -frequently presents with these, psychiatry consulted   Essential hypertension -continue diltiazem, Imdur  PAF -continue diltiazem, not a good candidate for anticoagulation due to homelessness and nonadherence   Diabetes type 2 with renal manifestation - SSI  Scheduled Meds:  sodium chloride   Intravenous Once   aspirin  81 mg Oral Daily   atorvastatin  40 mg Oral Daily   diltiazem  180 mg Oral Daily   docusate sodium  100 mg Oral BID   enoxaparin (LOVENOX) injection  30 mg Subcutaneous Q24H   furosemide  40 mg Intravenous Once   insulin aspart  0-15 Units Subcutaneous TID WC   insulin aspart  0-5 Units Subcutaneous QHS   isosorbide mononitrate  15 mg Oral Daily   pantoprazole  40 mg Oral Daily   QUEtiapine  50 mg Oral QHS   sodium bicarbonate  650 mg Oral TID   Continuous Infusions:  azithromycin 500 mg (02/14/23 1151)   cefTRIAXone (ROCEPHIN)  IV 1 g (02/15/23 0933)   PRN Meds:.acetaminophen **OR** acetaminophen, albuterol, hydrALAZINE, ondansetron **OR** ondansetron (ZOFRAN) IV, polyethylene glycol, traZODone  Current Outpatient Medications  Medication Instructions   albuterol (VENTOLIN HFA) 108 (90 Base) MCG/ACT inhaler 1-2 puffs, Inhalation, Every 6 hours PRN   aspirin 81 MG chewable tablet Daily   atorvastatin (LIPITOR) 40 mg, Oral, Daily   b complex-vitamin c-folic acid (NEPHRO-VITE) 0.8  MG TABS tablet 1 tablet, Daily   diltiazem (CARDIZEM CD) 180 mg, Oral, Daily   ferrous sulfate 325 mg, Oral, Daily with breakfast   FLUoxetine (PROZAC) 20 mg, Oral, Daily   fluticasone furoate-vilanterol (BREO ELLIPTA) 200-25 MCG/ACT AEPB 1 puff, Inhalation, Daily   folic acid (FOLVITE) 1 mg, Oral, Daily    furosemide (LASIX) 20 mg, Oral, Daily   hydrALAZINE (APRESOLINE) 25 mg, Oral, Every 8 hours   isosorbide mononitrate (IMDUR) 15 mg, Oral, Daily   nitroGLYCERIN (NITROSTAT) 0.4 mg, Sublingual, Every 5 min PRN   pantoprazole (PROTONIX) 40 mg, Oral, Daily   polyethylene glycol (MIRALAX / GLYCOLAX) 17 g, Oral, 2 times daily   QUEtiapine (SEROQUEL) 50 mg, Oral, Daily at bedtime   sodium bicarbonate 650 mg, Oral, 3 times daily    Diet Orders (From admission, onward)     Start     Ordered   02/14/23 1351  Diet Carb Modified Fluid consistency: Thin; Room service appropriate? Yes  Diet effective now       Question Answer Comment  Diet-HS Snack? Nothing   Calorie Level Medium 1600-2000   Fluid consistency: Thin   Room service appropriate? Yes      02/14/23 1350            DVT prophylaxis: Place TED hose Start: 02/15/23 0718 enoxaparin (LOVENOX) injection 30 mg Start: 02/14/23 1800 SCDs Start: 02/14/23 1148   Lab Results  Component Value Date   PLT 105 (L) 02/15/2023      Code Status: Full Code  Family Communication: no family at bedside   Status is: Observation The patient will require care spanning > 2 midnights and should be moved to inpatient because: AKI, worsening anemia  Level of care: Med-Surg  Consultants:  none  Objective: Vitals:   02/14/23 1721 02/14/23 2058 02/15/23 0044 02/15/23 0455  BP: (!) 146/84 (!) 145/77 123/69 126/69  Pulse: 77 85 72 72  Resp: 19 20 20 20   Temp: 98.5 F (36.9 C) 98.7 F (37.1 C) 99 F (37.2 C) 100.3 F (37.9 C)  TempSrc: Oral Oral Oral Oral  SpO2: 100% 100% 99% 100%  Weight:      Height:        Intake/Output Summary (Last 24 hours) at 02/15/2023 1004 Last data filed at 02/15/2023 0500 Gross per 24 hour  Intake 975 ml  Output 500 ml  Net 475 ml   Wt Readings from Last 3 Encounters:  02/14/23 72.6 kg  02/10/23 75 kg  02/02/23 74.4 kg    Examination:  Constitutional: NAD Eyes: no scleral icterus ENMT: Mucous  membranes are moist.  Neck: normal, supple Respiratory: Bibasilar rhonchi, no wheezing heard.  Slightly tachypneic Cardiovascular: Regular rate and rhythm, no murmurs / rubs / gallops. No LE edema.  Abdomen: non distended, no tenderness. Bowel sounds positive.  Musculoskeletal: no clubbing / cyanosis.   Data Reviewed: I have independently reviewed following labs and imaging studies   CBC Recent Labs  Lab 02/10/23 1102 02/14/23 0959 02/15/23 0532  WBC 3.9* 7.0 5.4  HGB 7.9* 7.9* 6.1*  HCT 25.1* 25.6* 20.0*  PLT 109* 114* 105*  MCV 90.0 92.1 93.9  MCH 28.3 28.4 28.6  MCHC 31.5 30.9 30.5  RDW 13.9 14.0 14.2  LYMPHSABS  --  0.9  --   MONOABS  --  0.4  --   EOSABS  --  0.1  --   BASOSABS  --  0.0  --     Recent Labs  Lab 02/10/23  1102 02/14/23 0959 02/15/23 0532  NA 139 142 140  K 4.7 5.0 4.5  CL 113* 117* 116*  CO2 17* 13* 16*  GLUCOSE 159* 101* 122*  BUN 64* 78* 78*  CREATININE 3.39* 3.80* 4.04*  CALCIUM 8.0* 7.9* 7.5*  AST  --  66*  --   ALT  --  115*  --   ALKPHOS  --  101  --   BILITOT  --  0.3  --   ALBUMIN  --  3.2*  --   BNP  --  662.2*  --     ------------------------------------------------------------------------------------------------------------------ No results for input(s): "CHOL", "HDL", "LDLCALC", "TRIG", "CHOLHDL", "LDLDIRECT" in the last 72 hours.  Lab Results  Component Value Date   HGBA1C 6.8 (H) 09/30/2022   ------------------------------------------------------------------------------------------------------------------ No results for input(s): "TSH", "T4TOTAL", "T3FREE", "THYROIDAB" in the last 72 hours.  Invalid input(s): "FREET3"  Cardiac Enzymes No results for input(s): "CKMB", "TROPONINI", "MYOGLOBIN" in the last 168 hours.  Invalid input(s): "CK" ------------------------------------------------------------------------------------------------------------------    Component Value Date/Time   BNP 662.2 (H) 02/14/2023 0959     CBG: Recent Labs  Lab 02/14/23 1630 02/15/23 0817  GLUCAP 168* 95    Recent Results (from the past 240 hour(s))  Resp panel by RT-PCR (RSV, Flu A&B, Covid) Anterior Nasal Swab     Status: None   Collection Time: 02/14/23  8:57 AM   Specimen: Anterior Nasal Swab  Result Value Ref Range Status   SARS Coronavirus 2 by RT PCR NEGATIVE NEGATIVE Final    Comment: (NOTE) SARS-CoV-2 target nucleic acids are NOT DETECTED.  The SARS-CoV-2 RNA is generally detectable in upper respiratory specimens during the acute phase of infection. The lowest concentration of SARS-CoV-2 viral copies this assay can detect is 138 copies/mL. A negative result does not preclude SARS-Cov-2 infection and should not be used as the sole basis for treatment or other patient management decisions. A negative result may occur with  improper specimen collection/handling, submission of specimen other than nasopharyngeal swab, presence of viral mutation(s) within the areas targeted by this assay, and inadequate number of viral copies(<138 copies/mL). A negative result must be combined with clinical observations, patient history, and epidemiological information. The expected result is Negative.  Fact Sheet for Patients:  BloggerCourse.com  Fact Sheet for Healthcare Providers:  SeriousBroker.it  This test is no t yet approved or cleared by the [place] FDA and  has been authorized for detection and/or diagnosis of SARS-CoV-2 by FDA under an Emergency Use Authorization (EUA). This EUA will remain  in effect (meaning this test can be used) for the duration of the COVID-19 declaration under Section 564(b)(1) of the Act, 21 U.S.C.section 360bbb-3(b)(1), unless the authorization is terminated  or revoked sooner.       Influenza A by PCR NEGATIVE NEGATIVE Final   Influenza B by PCR NEGATIVE NEGATIVE Final    Comment: (NOTE) The Xpert Xpress  SARS-CoV-2/FLU/RSV plus assay is intended as an aid in the diagnosis of influenza from Nasopharyngeal swab specimens and should not be used as a sole basis for treatment. Nasal washings and aspirates are unacceptable for Xpert Xpress SARS-CoV-2/FLU/RSV testing.  Fact Sheet for Patients: BloggerCourse.com  Fact Sheet for Healthcare Providers: SeriousBroker.it  This test is not yet approved or cleared by the [place] FDA and has been authorized for detection and/or diagnosis of SARS-CoV-2 by FDA under an Emergency Use Authorization (EUA). This EUA will remain in effect (meaning this test can be used) for the duration of the  COVID-19 declaration under Section 564(b)(1) of the Act, 21 U.S.C. section 360bbb-3(b)(1), unless the authorization is terminated or revoked.     Resp Syncytial Virus by PCR NEGATIVE NEGATIVE Final    Comment: (NOTE) Fact Sheet for Patients: BloggerCourse.com  Fact Sheet for Healthcare Providers: SeriousBroker.it  This test is not yet approved or cleared by the [place] FDA and has been authorized for detection and/or diagnosis of SARS-CoV-2 by FDA under an Emergency Use Authorization (EUA). This EUA will remain in effect (meaning this test can be used) for the duration of the COVID-19 declaration under Section 564(b)(1) of the Act, 21 U.S.C. section 360bbb-3(b)(1), unless the authorization is terminated or revoked.  Performed at [facility], [address], [place], [place] [address]      Radiology Studies: No results found.   Pamella Pert, MD, PhD [facility]  Between 7 am - 7 pm I am available, please contact me via Amion (for emergencies) or Securechat (non urgent messages)  Between 7 pm - 7 am I am not available, please contact night coverage MD/APP via Amion

## 2023-02-16 DIAGNOSIS — J189 Pneumonia, unspecified organism: Secondary | ICD-10-CM | POA: Diagnosis not present

## 2023-02-16 LAB — MAGNESIUM: Magnesium: 1.4 mg/dL — ABNORMAL LOW (ref 1.7–2.4)

## 2023-02-16 LAB — TYPE AND SCREEN: ABO/RH(D): O POS

## 2023-02-16 LAB — COMPREHENSIVE METABOLIC PANEL
ALT: 61 U/L — ABNORMAL HIGH (ref 0–44)
AST: 18 U/L (ref 15–41)
Albumin: 2.5 g/dL — ABNORMAL LOW (ref 3.5–5.0)
Alkaline Phosphatase: 70 U/L (ref 38–126)
Anion gap: 10 (ref 5–15)
BUN: 67 mg/dL — ABNORMAL HIGH (ref 8–23)
CO2: 17 mmol/L — ABNORMAL LOW (ref 22–32)
Calcium: 7.7 mg/dL — ABNORMAL LOW (ref 8.9–10.3)
Chloride: 114 mmol/L — ABNORMAL HIGH (ref 98–111)
Creatinine, Ser: 3.95 mg/dL — ABNORMAL HIGH (ref 0.61–1.24)
GFR, Estimated: 15 mL/min — ABNORMAL LOW (ref >60)
Glucose, Bld: 104 mg/dL — ABNORMAL HIGH (ref 70–99)
Potassium: 4.2 mmol/L (ref 3.5–5.1)
Sodium: 141 mmol/L (ref 135–145)
Total Bilirubin: 0.5 mg/dL (ref 0.3–1.2)
Total Protein: 5.8 g/dL — ABNORMAL LOW (ref 6.5–8.1)

## 2023-02-16 LAB — BPAM RBC
Unit Type and Rh: 5100
Unit Type and Rh: 5100

## 2023-02-16 LAB — CBC
HCT: 22.1 % — ABNORMAL LOW (ref 39.0–52.0)
Hemoglobin: 6.9 g/dL — CL (ref 13.0–17.0)
MCH: 28.9 pg (ref 26.0–34.0)
MCHC: 31.2 g/dL (ref 30.0–36.0)
MCV: 92.5 fL (ref 80.0–100.0)
Platelets: 108 10*3/uL — ABNORMAL LOW (ref 150–400)
RBC: 2.39 MIL/uL — ABNORMAL LOW (ref 4.22–5.81)
RDW: 14.7 % (ref 11.5–15.5)
WBC: 5 10*3/uL (ref 4.0–10.5)
nRBC: 0 % (ref 0.0–0.2)

## 2023-02-16 LAB — GLUCOSE, CAPILLARY
Glucose-Capillary: 117 mg/dL — ABNORMAL HIGH (ref 70–99)
Glucose-Capillary: 117 mg/dL — ABNORMAL HIGH (ref 70–99)
Glucose-Capillary: 118 mg/dL — ABNORMAL HIGH (ref 70–99)
Glucose-Capillary: 194 mg/dL — ABNORMAL HIGH (ref 70–99)

## 2023-02-16 LAB — PREPARE RBC (CROSSMATCH)

## 2023-02-16 MED ORDER — FUROSEMIDE 10 MG/ML IJ SOLN
40.0000 mg | Freq: Once | INTRAMUSCULAR | Status: AC
  Administered 2023-02-16: 40 mg via INTRAVENOUS
  Filled 2023-02-16: qty 4

## 2023-02-16 MED ORDER — DOLUTEGRAVIR SODIUM 50 MG PO TABS
50.0000 mg | ORAL_TABLET | Freq: Every day | ORAL | Status: DC
  Administered 2023-02-16 – 2023-02-19 (×4): 50 mg via ORAL
  Filled 2023-02-16 (×5): qty 1

## 2023-02-16 MED ORDER — DOLUTEGRAVIR-RILPIVIRINE 50-25 MG PO TABS: 1.0000 | ORAL_TABLET | Freq: Every day | ORAL | Status: DC

## 2023-02-16 MED ORDER — MAGNESIUM SULFATE 2 GM/50ML IV SOLN
2.0000 g | Freq: Once | INTRAVENOUS | Status: AC
  Administered 2023-02-16: 2 g via INTRAVENOUS
  Filled 2023-02-16: qty 50

## 2023-02-16 MED ORDER — RILPIVIRINE HCL 25 MG PO TABS
25.0000 mg | ORAL_TABLET | Freq: Every day | ORAL | Status: DC
  Administered 2023-02-16 – 2023-02-19 (×4): 25 mg via ORAL
  Filled 2023-02-16 (×4): qty 1

## 2023-02-16 MED ORDER — SODIUM CHLORIDE 0.9% IV SOLUTION: Freq: Once | INTRAVENOUS | Status: AC

## 2023-02-16 NOTE — Progress Notes (Signed)
PROGRESS NOTE  Michael [name] WUJ:[id number] DOB: Jan 15, 1951 DOA: 02/14/2023 PCP: [name], MD   LOS: 1 day   Brief Narrative / Interim history: 72 year old male with history of HIV, homelessness, ongoing cocaine abuse, PAF, CKD stage IV, CAD status post CABG, IDDM who comes into the hospital with community-acquired pneumonia as well as suicidal ideation.  He is well-known to our system with frequent ED visits, this being his 11th visit in the past month.  He presents currently with a cough for the last 2 weeks, cough being productive.  He is nonadherent to any of his prescribed medications and continues to have a UDS positive for cocaine.  Subjective / 24h Interval events: Complains of persistent shortness of breath, productive cough, weakness  Assesement and Plan: Principal Problem:   CAP (community acquired pneumonia) Active Problems:   Suicidal ideations   Major depressive disorder, recurrent, severe without psychotic features ([name])   Cocaine use disorder, severe, dependence ([name])   Substance induced mood disorder ([name])   Type II diabetes mellitus with renal manifestations ([name])   AKI (acute kidney injury) ([name])  Principal problem Community-acquired pneumonia -as evidenced by his symptoms as well as a chest x-ray showing right lower lobe consolidation.  Patient has been placed on ceftriaxone and azithromycin, continue, today's day #3, plan for 5 days total -Supportive care with antitussives  Problems AKI on CKD stage IV -his baseline creatinine appears to be around 3.5.  Creatinine on presentation 3.8, increased to 4.0 on 5/11.  Improving today.  Continue to monitor.  Anemia, multifactorial -likely in the setting of underlying chronic kidney disease, untreated HIV, and also patient reports melena however his story is somewhat vague.  Transfuse a second unit of blood and blood cells today.  Follow with Lasix in the afternoon  Non-anion gap metabolic acidosis -due to  renal failure.  Continue sodium bicarb  History of HIV -noncompliant to his home medications.  Needs ID follow-up as an outpatient  Thrombocytopenia -chronic, monitor.  CAD status post CABG -no chest pain.  Unfortunately he is not adhering to any of his home meds  Cocaine use disorder -counseled for cessation  Suicidal ideation -frequently presents with these, psychiatry consulted   Essential hypertension -continue diltiazem, Imdur  PAF -continue diltiazem, not a good candidate for anticoagulation due to homelessness and nonadherence   Diabetes type 2 with renal manifestation - SSI  Scheduled Meds:  sodium chloride   Intravenous Once   aspirin  81 mg Oral Daily   atorvastatin  40 mg Oral Daily   diltiazem  180 mg Oral Daily   docusate sodium  100 mg Oral BID   DULoxetine  30 mg Oral Daily   fluticasone  2 spray Each Nare Daily   insulin aspart  0-15 Units Subcutaneous TID WC   insulin aspart  0-5 Units Subcutaneous QHS   isosorbide mononitrate  15 mg Oral Daily   pantoprazole  40 mg Oral Daily   QUEtiapine  50 mg Oral QHS   sodium bicarbonate  650 mg Oral TID   Continuous Infusions:  azithromycin Stopped (02/15/23 1123)   cefTRIAXone (ROCEPHIN)  IV Stopped (02/15/23 1009)   PRN Meds:.acetaminophen **OR** acetaminophen, albuterol, guaiFENesin-dextromethorphan, hydrALAZINE, ondansetron **OR** ondansetron (ZOFRAN) IV, polyethylene glycol  Current Outpatient Medications  Medication Instructions   albuterol (VENTOLIN HFA) 108 (90 Base) MCG/ACT inhaler 1-2 puffs, Inhalation, Every 6 hours PRN   aspirin 81 MG chewable tablet Daily   atorvastatin (LIPITOR) 40 mg, Oral, Daily   b  complex-vitamin c-folic acid (NEPHRO-VITE) 0.8 MG TABS tablet 1 tablet, Daily   diltiazem (CARDIZEM CD) 180 mg, Oral, Daily   ferrous sulfate 325 mg, Oral, Daily with breakfast   FLUoxetine (PROZAC) 20 mg, Oral, Daily   fluticasone furoate-vilanterol (BREO ELLIPTA) 200-25 MCG/ACT AEPB 1 puff,  Inhalation, Daily   folic acid (FOLVITE) 1 mg, Oral, Daily   furosemide (LASIX) 20 mg, Oral, Daily   hydrALAZINE (APRESOLINE) 25 mg, Oral, Every 8 hours   isosorbide mononitrate (IMDUR) 15 mg, Oral, Daily   nitroGLYCERIN (NITROSTAT) 0.4 mg, Sublingual, Every 5 min PRN   pantoprazole (PROTONIX) 40 mg, Oral, Daily   polyethylene glycol (MIRALAX / GLYCOLAX) 17 g, Oral, 2 times daily   QUEtiapine (SEROQUEL) 50 mg, Oral, Daily at bedtime   sodium bicarbonate 650 mg, Oral, 3 times daily    Diet Orders (From admission, onward)     Start     Ordered   02/14/23 1351  Diet Carb Modified Fluid consistency: Thin; Room service appropriate? Yes  Diet effective now       Question Answer Comment  Diet-HS Snack? Nothing   Calorie Level Medium 1600-2000   Fluid consistency: Thin   Room service appropriate? Yes      02/14/23 1350            DVT prophylaxis: Place TED hose Start: 02/15/23 0718 SCDs Start: 02/14/23 1148   Lab Results  Component Value Date   PLT 108 (L) 02/16/2023      Code Status: Full Code  Family Communication: no family at bedside   Status is: Inpatient  Level of care: Med-Surg  Consultants:  none  Objective: Vitals:   02/16/23 0448 02/16/23 0755 02/16/23 0817 02/16/23 0820  BP: (!) 151/90 (!) 159/95 (!) 164/88 (!) 164/88  Pulse: 77 77 67 67  Resp: 18 18 18 18   Temp: 98.3 F (36.8 C) 98.2 F (36.8 C) 98.6 F (37 C) 98.6 F (37 C)  TempSrc: Oral Oral  Oral  SpO2: 99% 100%  100%  Weight:      Height:        Intake/Output Summary (Last 24 hours) at 02/16/2023 0919 Last data filed at 02/16/2023 0400 Gross per 24 hour  Intake 1937.5 ml  Output 1800 ml  Net 137.5 ml    Wt Readings from Last 3 Encounters:  02/14/23 72.6 kg  02/10/23 75 kg  02/02/23 74.4 kg    Examination:  Constitutional: NAD Eyes: lids and conjunctivae normal, no scleral icterus ENMT: mmm Neck: normal, supple Respiratory: clear to auscultation bilaterally, no wheezing, no  crackles. Normal respiratory effort.  Cardiovascular: Regular rate and rhythm, no murmurs / rubs / gallops. No LE edema. Abdomen: soft, no distention, no tenderness. Bowel sounds positive.   Data Reviewed: I have independently reviewed following labs and imaging studies   CBC Recent Labs  Lab 02/10/23 1102 02/14/23 0959 02/15/23 0532 02/16/23 0534  WBC 3.9* 7.0 5.4 5.0  HGB 7.9* 7.9* 6.1* 6.9*  HCT 25.1* 25.6* 20.0* 22.1*  PLT 109* 114* 105* 108*  MCV 90.0 92.1 93.9 92.5  MCH 28.3 28.4 28.6 28.9  MCHC 31.5 30.9 30.5 31.2  RDW 13.9 14.0 14.2 14.7  LYMPHSABS  --  0.9  --   --   MONOABS  --  0.4  --   --   EOSABS  --  0.1  --   --   BASOSABS  --  0.0  --   --      Recent Labs  Lab 02/10/23 1102 02/14/23 0959 02/15/23 0532 02/16/23 0534  NA 139 142 140 141  K 4.7 5.0 4.5 4.2  CL 113* 117* 116* 114*  CO2 17* 13* 16* 17*  GLUCOSE 159* 101* 122* 104*  BUN 64* 78* 78* 67*  CREATININE 3.39* 3.80* 4.04* 3.95*  CALCIUM 8.0* 7.9* 7.5* 7.7*  AST  --  66*  --  18  ALT  --  115*  --  61*  ALKPHOS  --  101  --  70  BILITOT  --  0.3  --  0.5  ALBUMIN  --  3.2*  --  2.5*  MG  --   --   --  1.4*  BNP  --  662.2*  --   --      ------------------------------------------------------------------------------------------------------------------ No results for input(s): "CHOL", "HDL", "LDLCALC", "TRIG", "CHOLHDL", "LDLDIRECT" in the last 72 hours.  Lab Results  Component Value Date   HGBA1C 6.8 (H) 09/30/2022   ------------------------------------------------------------------------------------------------------------------ No results for input(s): "TSH", "T4TOTAL", "T3FREE", "THYROIDAB" in the last 72 hours.  Invalid input(s): "FREET3"  Cardiac Enzymes No results for input(s): "CKMB", "TROPONINI", "MYOGLOBIN" in the last 168 hours.  Invalid input(s): "CK" ------------------------------------------------------------------------------------------------------------------     Component Value Date/Time   BNP 662.2 (H) 02/14/2023 0959    CBG: Recent Labs  Lab 02/15/23 0817 02/15/23 1205 02/15/23 1653 02/15/23 2051 02/16/23 0731  GLUCAP 95 147* 113* 146* 117*     Recent Results (from the past 240 hour(s))  Resp panel by RT-PCR (RSV, Flu A&B, Covid) Anterior Nasal Swab     Status: None   Collection Time: 02/14/23  8:57 AM   Specimen: Anterior Nasal Swab  Result Value Ref Range Status   SARS Coronavirus 2 by RT PCR NEGATIVE NEGATIVE Final    Comment: (NOTE) SARS-CoV-2 target nucleic acids are NOT DETECTED.  The SARS-CoV-2 RNA is generally detectable in upper respiratory specimens during the acute phase of infection. The lowest concentration of SARS-CoV-2 viral copies this assay can detect is 138 copies/mL. A negative result does not preclude SARS-Cov-2 infection and should not be used as the sole basis for treatment or other patient management decisions. A negative result may occur with  improper specimen collection/handling, submission of specimen other than nasopharyngeal swab, presence of viral mutation(s) within the areas targeted by this assay, and inadequate number of viral copies(<138 copies/mL). A negative result must be combined with clinical observations, patient history, and epidemiological information. The expected result is Negative.  Fact Sheet for Patients:  BloggerCourse.com  Fact Sheet for Healthcare Providers:  SeriousBroker.it  This test is no t yet approved or cleared by the [place] FDA and  has been authorized for detection and/or diagnosis of SARS-CoV-2 by FDA under an Emergency Use Authorization (EUA). This EUA will remain  in effect (meaning this test can be used) for the duration of the COVID-19 declaration under Section 564(b)(1) of the Act, 21 U.S.C.section 360bbb-3(b)(1), unless the authorization is terminated  or revoked sooner.       Influenza A by PCR  NEGATIVE NEGATIVE Final   Influenza B by PCR NEGATIVE NEGATIVE Final    Comment: (NOTE) The Xpert Xpress SARS-CoV-2/FLU/RSV plus assay is intended as an aid in the diagnosis of influenza from Nasopharyngeal swab specimens and should not be used as a sole basis for treatment. Nasal washings and aspirates are unacceptable for Xpert Xpress SARS-CoV-2/FLU/RSV testing.  Fact Sheet for Patients: BloggerCourse.com  Fact Sheet for Healthcare Providers: SeriousBroker.it  This test is not yet  approved or cleared by the [place] and has been authorized for detection and/or diagnosis of SARS-CoV-2 by FDA under an Emergency Use Authorization (EUA). This EUA will remain in effect (meaning this test can be used) for the duration of the COVID-19 declaration under Section 564(b)(1) of the Act, 21 U.S.C. section 360bbb-3(b)(1), unless the authorization is terminated or revoked.     Resp Syncytial Virus by PCR NEGATIVE NEGATIVE Final    Comment: (NOTE) Fact Sheet for Patients: BloggerCourse.com  Fact Sheet for Healthcare Providers: SeriousBroker.it  This test is not yet approved or cleared by the [place] FDA and has been authorized for detection and/or diagnosis of SARS-CoV-2 by FDA under an Emergency Use Authorization (EUA). This EUA will remain in effect (meaning this test can be used) for the duration of the COVID-19 declaration under Section 564(b)(1) of the Act, 21 U.S.C. section 360bbb-3(b)(1), unless the authorization is terminated or revoked.  Performed at [facility], [address] [place], [place] [address]      Radiology Studies: No results found.   Pamella Pert, MD, PhD [facility]  Between 7 am - 7 pm I am available, please contact me via Amion (for emergencies) or Securechat (non urgent messages)  Between 7 pm - 7 am I am not  available, please contact night coverage MD/APP via Amion

## 2023-02-17 ENCOUNTER — Other Ambulatory Visit (HOSPITAL_COMMUNITY): Payer: Self-pay

## 2023-02-17 DIAGNOSIS — F1420 Cocaine dependence, uncomplicated: Secondary | ICD-10-CM | POA: Diagnosis not present

## 2023-02-17 DIAGNOSIS — Z5900 Homelessness unspecified: Secondary | ICD-10-CM

## 2023-02-17 DIAGNOSIS — J189 Pneumonia, unspecified organism: Secondary | ICD-10-CM | POA: Diagnosis not present

## 2023-02-17 DIAGNOSIS — R45851 Suicidal ideations: Secondary | ICD-10-CM | POA: Diagnosis not present

## 2023-02-17 DIAGNOSIS — F332 Major depressive disorder, recurrent severe without psychotic features: Secondary | ICD-10-CM | POA: Diagnosis not present

## 2023-02-17 DIAGNOSIS — F1994 Other psychoactive substance use, unspecified with psychoactive substance-induced mood disorder: Secondary | ICD-10-CM | POA: Diagnosis not present

## 2023-02-17 LAB — CBC
HCT: 25.8 % — ABNORMAL LOW (ref 39.0–52.0)
Hemoglobin: 8.4 g/dL — ABNORMAL LOW (ref 13.0–17.0)
MCH: 29.2 pg (ref 26.0–34.0)
MCHC: 32.6 g/dL (ref 30.0–36.0)
MCV: 89.6 fL (ref 80.0–100.0)
Platelets: 110 10*3/uL — ABNORMAL LOW (ref 150–400)
RBC: 2.88 MIL/uL — ABNORMAL LOW (ref 4.22–5.81)
RDW: 14.7 % (ref 11.5–15.5)
WBC: 5.8 10*3/uL (ref 4.0–10.5)
nRBC: 0 % (ref 0.0–0.2)

## 2023-02-17 LAB — BPAM RBC
Blood Product Expiration Date: 202406062359
Blood Product Expiration Date: 202406112359
ISSUE DATE / TIME: 202405111309
ISSUE DATE / TIME: 202405120756

## 2023-02-17 LAB — BASIC METABOLIC PANEL
Anion gap: 8 (ref 5–15)
BUN: 59 mg/dL — ABNORMAL HIGH (ref 8–23)
CO2: 20 mmol/L — ABNORMAL LOW (ref 22–32)
Calcium: 7.7 mg/dL — ABNORMAL LOW (ref 8.9–10.3)
Chloride: 109 mmol/L (ref 98–111)
Creatinine, Ser: 3.82 mg/dL — ABNORMAL HIGH (ref 0.61–1.24)
GFR, Estimated: 16 mL/min — ABNORMAL LOW (ref >60)
Glucose, Bld: 112 mg/dL — ABNORMAL HIGH (ref 70–99)
Potassium: 4.2 mmol/L (ref 3.5–5.1)
Sodium: 137 mmol/L (ref 135–145)

## 2023-02-17 LAB — GLUCOSE, CAPILLARY
Glucose-Capillary: 144 mg/dL — ABNORMAL HIGH (ref 70–99)
Glucose-Capillary: 120 mg/dL — ABNORMAL HIGH (ref 70–99)
Glucose-Capillary: 138 mg/dL — ABNORMAL HIGH (ref 70–99)
Glucose-Capillary: 147 mg/dL — ABNORMAL HIGH (ref 70–99)

## 2023-02-17 LAB — TYPE AND SCREEN: Unit division: 0

## 2023-02-17 LAB — MAGNESIUM: Magnesium: 1.7 mg/dL (ref 1.7–2.4)

## 2023-02-17 MED ORDER — FAMOTIDINE 20 MG PO TABS
10.0000 mg | ORAL_TABLET | ORAL | Status: DC
  Administered 2023-02-18: 10 mg via ORAL
  Filled 2023-02-17 (×2): qty 1

## 2023-02-17 NOTE — TOC Initial Note (Addendum)
Transition of Care [facility]) - Initial/Assessment Note    Patient Details  Name: Michael [name] MRN: [id number] Date of Birth: [birth year]  Transition of Care [facility]) CM/SW Contact:    Otelia Santee, LCSW Phone Number: 02/17/2023, 1:08 PM  Clinical Narrative:                 Pt currently recommended for inpatient psychiatric admission once medically ready. Possible barrier to placement will be pt's medical dx and concerns.  TOC will follow for medical readiness to begin process for inpatient psychiatric placement.   Update 3:00pm- CSW provided pt with list of [address] and information for [employer]. CSW encouraged pt to begin calling to seek transitional housing,   Expected Discharge Plan: Psychiatric Hospital Barriers to Discharge: Continued Medical Work up   Patient Goals and CMS Choice Patient states their goals for this hospitalization and ongoing recovery are:: To go to inpatient psych CMS Medicare.gov Compare Post Acute Care list provided to:: Patient Choice offered to / list presented to : Patient [place] ownership interest in [facility].provided to:: Patient    Expected Discharge Plan and Services In-house Referral: Clinical Social Work Discharge Planning Services: NA Post Acute Care Choice: NA Living arrangements for the past 2 months: Homeless Shelter                 DME Arranged: N/A DME Agency: NA                  Prior Living Arrangements/Services Living arrangements for the past 2 months: Homeless Shelter Lives with:: Self Patient language and need for interpreter reviewed:: Yes Do you feel safe going back to the place where you live?: Yes      Need for Family Participation in Patient Care: No (Comment) Care giver support system in place?: No (comment) Current home services: DME (none) Criminal Activity/Legal Involvement Pertinent to Current Situation/Hospitalization: No - Comment as needed  Activities of Daily  Living Home Assistive Devices/Equipment: None ADL Screening (condition at time of admission) Patient's cognitive ability adequate to safely complete daily activities?: Yes Is the patient deaf or have difficulty hearing?: No Does the patient have difficulty seeing, even when wearing glasses/contacts?: No Does the patient have difficulty concentrating, remembering, or making decisions?: No Patient able to express need for assistance with ADLs?: Yes Does the patient have difficulty dressing or bathing?: No Independently performs ADLs?: Yes (appropriate for developmental age) Does the patient have difficulty walking or climbing stairs?: No Weakness of Legs: None Weakness of Arms/Hands: None  Permission Sought/Granted   Permission granted to share information with : No              Emotional Assessment Appearance:: Appears stated age Attitude/Demeanor/Rapport: Unable to Assess Affect (typically observed): Unable to Assess Orientation: : Oriented to Self, Oriented to Place, Oriented to  Time, Oriented to Situation Alcohol / Substance Use: Alcohol Use, Tobacco Use, Illicit Drugs Psych Involvement: Yes (comment)  Admission diagnosis:  CAP (community acquired pneumonia) [J18.9] Acute on chronic combined systolic and diastolic congestive heart failure ([name]) [I50.43] AKI (acute kidney injury) ([name]) [N17.9] Community acquired pneumonia of right lower lobe of lung [J18.9] Patient Active Problem List   Diagnosis Date Noted   CAP (community acquired pneumonia) 02/14/2023   Acute pancreatitis 01/17/2023   AKI (acute kidney injury) ([name]) 12/12/2022   Hyperkalemia 10/05/2022   CKD (chronic kidney disease), stage IV ([name]) 09/23/2022   Polysubstance abuse ([name]) 09/13/2022   ARF (acute renal  failure) ([name]) 09/09/2022   (HFpEF) heart failure with preserved ejection fraction ([name]) 09/09/2022   Hypertensive urgency 12/09/2021   Impaired functional mobility, balance, and endurance 11/29/2021    Dilated cardiomyopathy ([name]) 11/29/2021   Arthritis 11/29/2021   Diabetic foot infection ([name]) 06/20/2021   Presence of aortocoronary bypass graft 04/16/2021   Iron deficiency anemia 04/04/2021   Atypical chest pain 04/03/2021   Anemia of chronic disease 12/14/2020   DMII (diabetes mellitus, type 2) ([name]) 08/14/2020   Idiopathic chronic gout of multiple sites without tophus 02/17/2020   Diabetic ulcer of toe of right foot associated with type 2 diabetes mellitus ([name]) 12/17/2019   Vitamin B12 deficiency 12/17/2019   Homelessness 12/13/2019   Cellulitis 12/07/2019   Osteomyelitis ([name]) 07/17/2019   Abscess or cellulitis of toe, right    Toe pain, right-second    MDD (major depressive disorder), recurrent episode, severe ([name]) 06/15/2019   Respiratory distress 04/12/2019   Pneumonia due to severe acute respiratory syndrome coronavirus 2 (SARS-CoV-2) 04/11/2019   COVID-19 virus infection 03/19/2019   Chest pain 10/03/2018   Malnutrition of moderate degree 09/21/2018   MDD (major depressive disorder), recurrent episode, moderate ([name])    Type II diabetes mellitus with renal manifestations ([name]) 05/04/2018   GERD (gastroesophageal reflux disease) 05/04/2018   S/P CABG (coronary artery bypass graft)    Left testicular pain    Depression 02/20/2018   Epididymo-orchitis, acute 02/20/2018   Essential hypertension 02/20/2018   Precordial chest pain 02/20/2018   Acute kidney injury superimposed on chronic kidney disease ([name]) 02/14/2018   HCAP (healthcare-associated pneumonia) 02/14/2018   History of pulmonary embolism 07/04/2017   Acute hyponatremia 05/11/2017   Hyperglycemia due to type 2 diabetes mellitus ([name]) 05/11/2017   CHF (congestive heart failure) ([name]) 04/01/2017   Hepatitis C 12/30/2016   Substance induced mood disorder ([name]) 11/23/2016   Chronic ischemic heart disease 11/12/2016   Paroxysmal A-fib ([name]) 11/12/2016   Back pain 04/18/2016   S/P carotid endarterectomy  11/15/2015   COPD (chronic obstructive pulmonary disease) ([name]) 10/22/2015   Stenosis of cervical spine with myelopathy ([name]) 10/20/2015   MDD (major depressive disorder), recurrent severe, without psychosis ([name]) 09/09/2015   History of fusion of cervical spine 08/28/2015   Cocaine use disorder, severe, dependence ([name])    Cocaine abuse with cocaine-induced mood disorder ([name]) 08/14/2015   Gout 07/10/2015   Acute renal failure superimposed on stage 3 chronic kidney disease ([name]) 03/06/2015   Chronic kidney disease, stage III (moderate) ([name]) 03/06/2015   Encounter for general adult medical examination with abnormal findings 02/09/2015   Major depressive disorder, recurrent, severe without psychotic features ([name])    Suicidal ideations 08/15/2014   Cervicalgia 06/28/2014   Lumbar radiculopathy, chronic 06/28/2014   Asthma, chronic 02/03/2014   S/P percutaneous transluminal coronary angioplasty 10/15/2013   3-vessel CAD 06/24/2013   ED (erectile dysfunction) of organic origin 07/07/2012   Hypertension goal BP (blood pressure) < 140/80 04/29/2012   Chondromalacia of left knee 03/19/2012   Hyperlipidemia with target LDL less than 100 02/12/2012   Fibromyalgia 02/12/2012   Pure hypercholesterolemia 02/12/2012   Cocaine use disorder ([name]) 01/10/2012    Class: Acute   Human immunodeficiency virus (HIV) disease ([name]) 12/16/2011   HIV (human immunodeficiency virus infection) ([name]) 08/27/2011   Uncontrolled type 2 diabetes with neuropathy 10/17/2000   PCP:  [name], MD Pharmacy:   [facility] [address] [place] [address] Phone: [phone] Fax: [phone]  [facility] [address] [place] [address] Phone: [phone] Fax: [phone]  [facility] [place] [facility] [place] [place] - [address] [place] [address] Phone: [phone] Fax: [phone]  [name] [address] [place] [address] Phone: [phone] Fax: [phone]  [facility] [address] [place] [address] Phone: [phone] Fax: [phone]  [id number] - [place], [place] - [address] [place] [address] Phone: [phone] Fax: [phone]     Social Determinants of Health (SDOH) Social History: SDOH Screenings   Food Insecurity: No Food Insecurity (02/14/2023)  Recent Concern: Food Insecurity - Food Insecurity Present (01/17/2023)  Housing: Medium Risk (02/14/2023)  Transportation Needs: Unmet Transportation Needs (02/14/2023)  Utilities: Not At Risk (02/14/2023)  Alcohol Screen: Low Risk  (11/27/2022)  Depression (PHQ2-9): High Risk (11/12/2021)  Financial Resource Strain: Medium Risk (09/16/2018)  Physical Activity: Unknown (04/22/2019)  Social Connections: Socially Isolated (09/16/2018)  Stress: Stress Concern Present (09/16/2018)  Tobacco Use: Low Risk  (02/15/2023)   SDOH Interventions: Housing Interventions: Inpatient TOC   Readmission Risk Interventions    02/17/2023    1:01 PM 07/05/2022   10:08 AM  Readmission Risk Prevention Plan  Transportation Screening Complete Complete  Medication Review (RN Care Manager) Complete Complete  PCP or Specialist appointment within 3-5 days of discharge Complete Complete  HRI or Home Care Consult Complete Complete  SW Recovery Care/Counseling Consult Complete Complete  Palliative Care Screening Not Applicable Not Applicable  Skilled Nursing Facility Not Applicable Not Applicable

## 2023-02-17 NOTE — Consult Note (Signed)
[facility] Face-to-Face Psychiatry Consult   Reason for Consult:'' suicidal ideation with no plan.'' Referring Physician:  Pamella Pert, MD Patient Identification: Michael [name] MRN:  [id number] Principal Diagnosis: CAP (community acquired pneumonia) Diagnosis:  Principal Problem:   CAP (community acquired pneumonia) Active Problems:   Suicidal ideations   Major depressive disorder, recurrent, severe without psychotic features ([name])   Cocaine use disorder, severe, dependence ([name])   Substance induced mood disorder ([name])   Type II diabetes mellitus with renal manifestations ([name])   AKI (acute kidney injury) ([name])   Total Time spent with patient: 1 hour  Subjective:   Michael [name] is a 72 y.o. male patient admitted with cough and suicidal ideations.  Patient very well known to the behavioral health service line he has had numerous admissions to include last medical admission 04/12-04/20 (length of stay 8 days); since discharge patient has had 6 emergency room visits for complaints of chest pain, constipation, suicidal ideation x 2.  Patient seen and assessed this afternoon at [facility] for suicidal ideations with no plan.  Since discharge in April, patient continues to use cocaine, and states he has been residing at the [facility].  He despises his current stay at the [facility] stating " they make asleep on the floor, is a lot of fighting, and drugs.  I does have too many medical problems going on I cannot stay there.  I need someone to help me find somewhere else to go."  When referencing current plan after discharge, patient states "I do not have 1."  He is encouraged to begin seeking shelter and substance abuse treatment prior to being medically stable.  While he is able to verbalize understanding of the above request, he continues to justify his reason for not having stable housing.  He is unable to identify how he is obtaining fines for cocaine and alcohol.  Patient states" have you  tried [employer] and the shelter in [place].  I am 100% disabled I cannot work.  The doctor told me I cannot work.  So I cannot go to an [facility] or transitional house."   Currently on interview, the patient. Alert and oriented x4.  Patient reports presenting to the emergency department after developing cough   He does report during casual conversation expressing some of his thoughts to staff members.  However patient does clarify  "I always have thoughts. "  Over the past couple weeks he reports increase in anxiety, worsening depressive symptoms, to include tearfulness, guilty, hopeless, disturbed sleep pattern, and difficulty concentrating. He also states he has a hard time staying at the [facility] and has no where else to go. He reports symptoms of anxiety including excessive worries, nervousness.  Patient denies any psychosis, hallucinations, or delusions at this time.. Patient denies PTSD symptoms including hypervigilance, flashbacks daily and nightmares every night.  He reports latent history of alcohol use, however further denies any withdrawal symptoms but endorses cravings on a daily basis.   Patient does not appear acutely manic on exam and he does not elicit or endorse any current psychotic symptoms. Patient currently denies active suicidal ideations, suicide thoughts, and or urges to self-harm.  He further denies any active or past homicidal ideations, and or auditory visual hallucinations.  He has a history of past psychiatric hospitalizations and past suicide attempts, see below.         In regards to his chronic suicidal ideations, he denies any recent intent or attempts.  He does  continue to verbalize his suicidal ideations do increase when under the influence of an illicit substance.  He is encouraged to utilize appropriate resources for substance abuse treatment, shelter/housing, and disability.  Patient is able to contract for safety at this time, and endorse willingness to  participate and finding placement or plan once medically stable. While there continues to be suspicion that patient presents for some secondary gain in this case housing, almost 100% of his medical admissions have been appropriate. He is able to understand that his decline in health is likely related to his noncompliance with medication and ongoing substance use.  Patient with recent 2-week inpatient psychiatric hospital stay was little to no benefit.  Following this discharge patient did not engage in any attempts to find suitable housing or safe discharge planning despite being given resources on every discharge.  Have initiated contact with TOC for additional resources for [place] and transitional housing.  Patient also a suitable candidate for inpatient substance abuse, however only endorses using cocaine at this time there are limited facilities available for treatment of cocaine use disorder severe/dependence and our area   HPI:  72 year old male with history of HIV, homelessness, Cocaine use disorder-severe, PAF, CKD stage IV, CAD status post CABG, IDDM and Major depressive disorder who was admitted to the hospital for the treatment of community-acquired pneumonia as well as suicidal ideation with no plan. Patient is well known to the psychiatric service due to frequent ED hospital visit for suicidal thoughts. Today, he reports worsening depressive symptoms characterized  low energy level, hopelessness, anhedonia, feeling of worthlessness and suicidal ideations with no specific plan. He reports being homeless, non-compliant with his medication and self medicating with cocaine. He reports that Cymbalta and Seroquel were prescribed for him during his last inpatient psych admission which worked well for him. He denies psychosis, delusions, homicidal thoughts but requesting for psychiatric inpatient admission for stabilization.  Past Psychiatric History: Patient was at inpatient psychiatric hospital at [facility]  11/30/2022. At the time, was diagnosed with MDD and discharged with Duloxetine 20 and quetapine 50mg . He was admitted for risk of self-injury and medication non-compliance.  Reports a 30-year history of cocaine use, last use the day prior to emergency room visit.  Denies any other history of illicit substance use.  Denies any history of violence, combativeness, aggression, or agitation.  Denies any history of legal charges or probation.  Risk to Self:  passive SI Risk to Others:  denies Prior Inpatient Therapy:  yes, last admission, April, 2024 Prior Outpatient Therapy:  non-compliant  Past Medical History:  Past Medical History:  Diagnosis Date   A-fib ([name])    Anemia    Asthma    No PFTs, history of childhood asthma   CAD (coronary artery disease)    Cellulitis 04/2014   left facial   Cellulitis and abscess of toe of right foot 12/08/2019   Chondromalacia of medial femoral condyle    Left knee MRI 04/28/12: Chondromalacia of the medial femoral condyle with slight peripheral degeneration of the meniscocapsular junction of the medial meniscus; followed by sports medicine   Chronic kidney failure, stage 4 (severe) ([name])    Collagen vascular disease ([name])    Crack cocaine use    for 20+ years, has been enrolled in detox programs in the past   Depression    with history of hospitalization for suicidal ideation   Diabetes mellitus 2002   Diagnosed in 2002, started insulin in 2012   Gout  Headache(784.0)    CT head 08/2011: Periventricular and subcortical white matter hypodensities are most in keeping with chronic microangiopathic change   HIV infection ([name]) 08/2011   Followed by Dr. Ninetta Lights   Hyperlipidemia    Hypertension    Pulmonary embolism [facility])     Past Surgical History:  Procedure Laterality Date   AMPUTATION Right 07/21/2019   Procedure: RIGHT SECOND TOE AMPUTATION;  Surgeon: Nadara Mustard, MD;  Location: [facility] OR;  Service: Orthopedics;  Laterality: Right;   BACK SURGERY      1988   BOWEL RESECTION     CARDIAC SURGERY     CERVICAL SPINE SURGERY     " rods in my neck "   CORONARY ARTERY BYPASS GRAFT     CORONARY STENT PLACEMENT     NM MYOCAR PERF WALL MOTION  12/27/2011   normal   SPINE SURGERY     Family History:  Family History  Problem Relation Age of Onset   Diabetes Mother    Hypertension Mother    Hyperlipidemia Mother    Diabetes Father    Cancer Father    Hypertension Father    Diabetes Brother    Heart disease Brother    Diabetes Sister    Colon cancer Neg Hx    Family Psychiatric  History:   Social History:  Social History   Substance and Sexual Activity  Alcohol Use No   Alcohol/week: 4.0 standard drinks of alcohol   Types: 2 Cans of beer, 2 Shots of liquor per week     Social History   Substance and Sexual Activity  Drug Use Not Currently   Frequency: 4.0 times per week   Types: "Crack" cocaine, Cocaine   Comment: Recent use of crack    Social History   Socioeconomic History   Marital status: Widowed    Spouse name: Not on file   Number of children: 2   Years of education: 67   Highest education level: 12th grade  Occupational History    Employer: UNEMPLOYED    Comment: 04/2016  Tobacco Use   Smoking status: Never   Smokeless tobacco: Never  Vaping Use   Vaping Use: Never used  Substance and Sexual Activity   Alcohol use: No    Alcohol/week: 4.0 standard drinks of alcohol    Types: 2 Cans of beer, 2 Shots of liquor per week   Drug use: Not Currently    Frequency: 4.0 times per week    Types: "Crack" cocaine, Cocaine    Comment: Recent use of crack   Sexual activity: Yes    Comment: DECLINED CONDOMS  Other Topics Concern   Not on file  Social History Narrative   Currently staying with a friend in [place].  Was staying @ local motel until a few days ago - left b/c of bed bugs.   Picked up from [facility].  Not followed by a psychiatrist.   Social Determinants of Health   Financial Resource Strain:  Medium Risk (09/16/2018)   Overall Financial Resource Strain (CARDIA)    Difficulty of Paying Living Expenses: Somewhat hard  Food Insecurity: No Food Insecurity (02/14/2023)   Hunger Vital Sign    Worried About Running Out of Food in the Last Year: Never true    Ran Out of Food in the Last Year: Never true  Recent Concern: Food Insecurity - Food Insecurity Present (01/17/2023)   Hunger Vital Sign    Worried About Programme researcher, broadcasting/film/video in  the Last Year: Often true    Ran Out of Food in the Last Year: Often true  Transportation Needs: Unmet Transportation Needs (02/14/2023)   PRAPARE - Administrator, Civil Service (Medical): Yes    Lack of Transportation (Non-Medical): Yes  Physical Activity: Unknown (04/22/2019)   Exercise Vital Sign    Days of Exercise per Week: Not on file    Minutes of Exercise per Session: 0 min  Stress: Stress Concern Present (09/16/2018)   [employer] of Occupational Health - Occupational Stress Questionnaire    Feeling of Stress : Very much  Social Connections: Socially Isolated (09/16/2018)   Social Connection and Isolation Panel [NHANES]    Frequency of Communication with Friends and Family: Once a week    Frequency of Social Gatherings with Friends and Family: Never    Attends Religious Services: Never    Database administrator or Organizations: No    Attends Banker Meetings: Never    Marital Status: Widowed   Additional Social History:    Allergies:  No Known Allergies  Labs:  Results for orders placed or performed during the hospital encounter of 02/14/23 (from the past 48 hour(s))  Glucose, capillary     Status: Abnormal   Collection Time: 02/15/23 12:05 PM  Result Value Ref Range   Glucose-Capillary 147 (H) 70 - 99 mg/dL    Comment: Glucose reference range applies only to samples taken after fasting for at least 8 hours.   Comment 1 Notify RN   Glucose, capillary     Status: Abnormal   Collection Time: 02/15/23   4:53 PM  Result Value Ref Range   Glucose-Capillary 113 (H) 70 - 99 mg/dL    Comment: Glucose reference range applies only to samples taken after fasting for at least 8 hours.   Comment 1 Notify RN   Glucose, capillary     Status: Abnormal   Collection Time: 02/15/23  8:51 PM  Result Value Ref Range   Glucose-Capillary 146 (H) 70 - 99 mg/dL    Comment: Glucose reference range applies only to samples taken after fasting for at least 8 hours.  Comprehensive metabolic panel     Status: Abnormal   Collection Time: 02/16/23  5:34 AM  Result Value Ref Range   Sodium 141 135 - 145 mmol/L   Potassium 4.2 3.5 - 5.1 mmol/L   Chloride 114 (H) 98 - 111 mmol/L   CO2 17 (L) 22 - 32 mmol/L   Glucose, Bld 104 (H) 70 - 99 mg/dL    Comment: Glucose reference range applies only to samples taken after fasting for at least 8 hours.   BUN 67 (H) 8 - 23 mg/dL   Creatinine, Ser 4.09 (H) 0.61 - 1.24 mg/dL   Calcium 7.7 (L) 8.9 - 10.3 mg/dL   Total Protein 5.8 (L) 6.5 - 8.1 g/dL   Albumin 2.5 (L) 3.5 - 5.0 g/dL   AST 18 15 - 41 U/L   ALT 61 (H) 0 - 44 U/L   Alkaline Phosphatase 70 38 - 126 U/L   Total Bilirubin 0.5 0.3 - 1.2 mg/dL   GFR, Estimated 15 (L) >60 mL/min    Comment: (NOTE) Calculated using the CKD-EPI Creatinine Equation (2021)    Anion gap 10 5 - 15    Comment: Performed at [facility], [address] [place], [place] [address]  CBC     Status: Abnormal   Collection Time: 02/16/23  5:34  AM  Result Value Ref Range   WBC 5.0 4.0 - 10.5 K/uL   RBC 2.39 (L) 4.22 - 5.81 MIL/uL   Hemoglobin 6.9 (LL) 13.0 - 17.0 g/dL    Comment: This critical result has verified and been called to PECHETTE, F RN by Shauna Hugh on 05 12 2024 at 0648, and has been read back.    HCT 22.1 (L) 39.0 - 52.0 %   MCV 92.5 80.0 - 100.0 fL   MCH 28.9 26.0 - 34.0 pg   MCHC 31.2 30.0 - 36.0 g/dL   RDW 09.8 11.9 - 14.7 %   Platelets 108 (L) 150 - 400 K/uL   nRBC 0.0 0.0 - 0.2 %    Comment: Performed  at [facility], [address], [place], [place] [address]  Magnesium     Status: Abnormal   Collection Time: 02/16/23  5:34 AM  Result Value Ref Range   Magnesium 1.4 (L) 1.7 - 2.4 mg/dL    Comment: Performed at [facility], [address] [place], [place] [address]  Glucose, capillary     Status: Abnormal   Collection Time: 02/16/23  7:31 AM  Result Value Ref Range   Glucose-Capillary 117 (H) 70 - 99 mg/dL    Comment: Glucose reference range applies only to samples taken after fasting for at least 8 hours.   Comment 1 Notify RN   Prepare RBC (crossmatch)     Status: None   Collection Time: 02/16/23  7:40 AM  Result Value Ref Range   Order Confirmation      ORDER PROCESSED BY BLOOD BANK Performed at [facility], [address] [place], [place] [address]   Glucose, capillary     Status: Abnormal   Collection Time: 02/16/23 12:41 PM  Result Value Ref Range   Glucose-Capillary 117 (H) 70 - 99 mg/dL    Comment: Glucose reference range applies only to samples taken after fasting for at least 8 hours.   Comment 1 Notify RN   Glucose, capillary     Status: Abnormal   Collection Time: 02/16/23  5:20 PM  Result Value Ref Range   Glucose-Capillary 118 (H) 70 - 99 mg/dL    Comment: Glucose reference range applies only to samples taken after fasting for at least 8 hours.  Glucose, capillary     Status: Abnormal   Collection Time: 02/16/23  9:01 PM  Result Value Ref Range   Glucose-Capillary 194 (H) 70 - 99 mg/dL    Comment: Glucose reference range applies only to samples taken after fasting for at least 8 hours.  Basic metabolic panel     Status: Abnormal   Collection Time: 02/17/23  5:00 AM  Result Value Ref Range   Sodium 137 135 - 145 mmol/L   Potassium 4.2 3.5 - 5.1 mmol/L   Chloride 109 98 - 111 mmol/L   CO2 20 (L) 22 - 32 mmol/L   Glucose, Bld 112 (H) 70 - 99 mg/dL    Comment: Glucose reference range applies only to  samples taken after fasting for at least 8 hours.   BUN 59 (H) 8 - 23 mg/dL   Creatinine, Ser 6.96 (H) 0.61 - 1.24 mg/dL   Calcium 7.7 (L) 8.9 - 10.3 mg/dL   GFR, Estimated 16 (L) >60 mL/min    Comment: (NOTE) Calculated using the CKD-EPI Creatinine Equation (2021)    Anion gap 8 5 - 15    Comment: Performed at [facility], [address], [place], [place] [address]  CBC     Status: Abnormal   Collection Time: 02/17/23  5:00 AM  Result Value Ref Range   WBC 5.8 4.0 - 10.5 K/uL   RBC 2.88 (L) 4.22 - 5.81 MIL/uL   Hemoglobin 8.4 (L) 13.0 - 17.0 g/dL   HCT 60.4 (L) 54.0 - 98.1 %   MCV 89.6 80.0 - 100.0 fL   MCH 29.2 26.0 - 34.0 pg   MCHC 32.6 30.0 - 36.0 g/dL   RDW 19.1 47.8 - 29.5 %   Platelets 110 (L) 150 - 400 K/uL   nRBC 0.0 0.0 - 0.2 %    Comment: Performed at [facility], [address], [place], [place] [address]  Magnesium     Status: None   Collection Time: 02/17/23  5:00 AM  Result Value Ref Range   Magnesium 1.7 1.7 - 2.4 mg/dL    Comment: Performed at [facility], [address], [place], [place] [address]  Glucose, capillary     Status: Abnormal   Collection Time: 02/17/23  7:20 AM  Result Value Ref Range   Glucose-Capillary 144 (H) 70 - 99 mg/dL    Comment: Glucose reference range applies only to samples taken after fasting for at least 8 hours.    Current Facility-Administered Medications  Medication Dose Route Frequency Provider Last Rate Last Admin   0.9 %  sodium chloride infusion (Manually program via Guardrails IV Fluids)   Intravenous Once Leatha Gilding, MD       acetaminophen (TYLENOL) tablet 650 mg  650 mg Oral Q6H PRN Kirby Crigler, Mir M, MD       Or   acetaminophen (TYLENOL) suppository 650 mg  650 mg Rectal Q6H PRN Kirby Crigler, Mir M, MD       albuterol (PROVENTIL) (2.5 MG/3ML) 0.083% nebulizer solution 2.5 mg  2.5 mg Nebulization Q2H PRN Kirby Crigler, Mir M, MD       aspirin chewable tablet  81 mg  81 mg Oral Daily Kirby Crigler, Mir M, MD   81 mg at 02/16/23 1000   atorvastatin (LIPITOR) tablet 40 mg  40 mg Oral Daily Kirby Crigler, Mir M, MD   40 mg at 02/16/23 1000   azithromycin (ZITHROMAX) 500 mg in sodium chloride 0.9 % 250 mL IVPB  500 mg Intravenous Q24H Kirby Crigler, Mir M, MD   Stopped at 02/16/23 1404   cefTRIAXone (ROCEPHIN) 1 g in sodium chloride 0.9 % 100 mL IVPB  1 g Intravenous Q24H Kirby Crigler, Mir M, MD 200 mL/hr at 02/17/23 0805 1 g at 02/17/23 0805   diltiazem (CARDIZEM CD) 24 hr capsule 180 mg  180 mg Oral Daily Kirby Crigler, Mir M, MD   180 mg at 02/16/23 1001   docusate sodium (COLACE) capsule 100 mg  100 mg Oral BID Kirby Crigler, Mir M, MD   100 mg at 02/16/23 2129   dolutegravir (TIVICAY) tablet 50 mg  50 mg Oral QAC lunch Leatha Gilding, MD   50 mg at 02/16/23 1347   And   rilpivirine (EDURANT) tablet 25 mg  25 mg Oral QAC lunch Leatha Gilding, MD   25 mg at 02/16/23 1347   DULoxetine (CYMBALTA) DR capsule 30 mg  30 mg Oral Daily Akintayo, Mojeed, MD   30 mg at 02/16/23 0959   fluticasone (FLONASE) 50 MCG/ACT nasal spray 2 spray  2 spray Each Nare Daily Leatha Gilding, MD   2 spray at 02/16/23 1107  guaiFENesin-dextromethorphan (ROBITUSSIN DM) 100-10 MG/5ML syrup 5 mL  5 mL Oral Q4H PRN Leatha Gilding, MD   5 mL at 02/16/23 1855   hydrALAZINE (APRESOLINE) injection 10 mg  10 mg Intravenous Q6H PRN Kirby Crigler, Mir M, MD       insulin aspart (novoLOG) injection 0-15 Units  0-15 Units Subcutaneous TID WC Kirby Crigler, Mir M, MD   2 Units at 02/17/23 0805   insulin aspart (novoLOG) injection 0-5 Units  0-5 Units Subcutaneous QHS Kirby Crigler, Mir M, MD       isosorbide mononitrate (IMDUR) 24 hr tablet 15 mg  15 mg Oral Daily Kirby Crigler, Mir M, MD   15 mg at 02/16/23 0959   ondansetron (ZOFRAN) tablet 4 mg  4 mg Oral Q6H PRN Kirby Crigler, Mir M, MD       Or   ondansetron [facility]) injection 4 mg  4 mg Intravenous Q6H PRN Kirby Crigler, Mir M, MD   4 mg at 02/17/23 0855    pantoprazole (PROTONIX) EC tablet 40 mg  40 mg Oral Daily Kirby Crigler, Mir M, MD   40 mg at 02/16/23 1000   polyethylene glycol (MIRALAX / GLYCOLAX) packet 17 g  17 g Oral Daily PRN Kirby Crigler, Mir M, MD       QUEtiapine (SEROQUEL) tablet 50 mg  50 mg Oral QHS Kirby Crigler, Mir M, MD   50 mg at 02/16/23 2129   sodium bicarbonate tablet 650 mg  650 mg Oral TID Leatha Gilding, MD   650 mg at 02/16/23 2129    Musculoskeletal: Strength & Muscle Tone: within normal limits Gait & Station: normal Patient leans: N/A   Psychiatric Specialty Exam:  Presentation  General Appearance:  Appropriate for Environment  Eye Contact: Minimal  Speech: Clear and Coherent  Speech Volume: Decreased  Handedness: Right   Mood and Affect  Mood: Dysphoric  Affect: Constricted   Thought Process  Thought Processes: Goal Directed  Descriptions of Associations:Intact  Orientation:Full (Time, Place and Person)  Thought Content:Logical  History of Schizophrenia/Schizoaffective disorder:No  Duration of Psychotic Symptoms:No data recorded Hallucinations:No data recorded  Ideas of Reference:None  Suicidal Thoughts:No data recorded  Homicidal Thoughts:No data recorded   Sensorium  Memory: Immediate Good; Recent Good; Remote Good  Judgment: Intact  Insight: Fair   Art therapist  Concentration: Fair  Attention Span: Fair  Recall: Good  Fund of Knowledge: Good  Language: Good   Psychomotor Activity  Psychomotor Activity: No data recorded   Assets  Assets: Communication Skills; Desire for Improvement   Sleep  Sleep: No data recorded   Physical Exam: Physical Exam Vitals and nursing note reviewed.  Constitutional:      Appearance: Normal appearance. He is normal weight.  Neurological:     General: No focal deficit present.     Mental Status: He is alert and oriented to person, place, and time. Mental status is at baseline.  Psychiatric:         Mood and Affect: Mood normal.        Behavior: Behavior normal.        Thought Content: Thought content normal.        Judgment: Judgment normal.    Review of Systems  Psychiatric/Behavioral:  Positive for depression, substance abuse and suicidal ideas. Negative for hallucinations and memory loss. The patient is not nervous/anxious and does not have insomnia.   All other systems reviewed and are negative.  Blood pressure (!) 162/95, pulse 71, temperature 98.2 F (36.8 C), temperature source Oral, resp.  rate 18, height 5\' 8"  (1.727 m), weight 72.6 kg, SpO2 99 %. Body mass index is 24.71 kg/m. 72 year old male who presented to the emergency room with a cough and suicidal ideations; subsequently diagnosed with pneumonia requiring medical admission is being treated for the above.  On today's evaluation he endorses a history of chronic suicidal ideations, with no plan.  He has remained consistent, with large component of suicidal ideations due to homelessness and substance use.  Patient does not currently present with active symptoms of an acute manic or psychotic episode and he is denying SI/HI. Patient does not meet involuntary commitment criteria at this time. Combination of personality structure and chronic substance abuse leading to impulsivity and mood dysregulation increase overall risk of self-harm. However, at this time, his mood is stable, the patient is future oriented.  He is provided with resources for substance abuse rehab and transitional housing are left at bedside.  There is no indication of dangerousness to self/others.   Chronic suicide risk factors prior attempts, traumatic past, stressful life events, mental illness, chronic medical conditions and disability Acute suicide risk factors non-adherence to care, mood and personality changes , substance use and hopeless Suicide protective factors willingness to seek help, religion/spiritual.  Given above risks versus protective  factors patients current acute risk for harm to self at this time is mitigated; but elevated in term of substance abuse. Given above risks versus protective factors patients current chronic risk for harm to self at this time is low to mild in context of ongoing substance use, but mitigated in terms of intentional imminent self-harm/suicide  Violence/assault/Homicide Risk Assessment: No risk factors. Protective factors include no identified conflict with others. In addition patient makes no homicidal statements. No history of assault. Patient is low risk of imminent harm to others/homicide.  Treatment Plan Summary: Diagnosis:  -Major depressive disorder-recurrent severe without psychosis -Cocaine use disorder-severe   Plan:  Continue current home medications.  Will DC duloxetine, has previously been ineffective for patient per chart review.  Continue Seroquel 50 mg p.o. nightly and Cymbalta 30 mg p.o. daily.  Resources: Patient instructed to call 911/crisis if medical/psychiatric emergency of psychosis/SI/HI.  While future psychiatric events cannot be accurately predicted, the patient does not currently require acute inpatient psychiatric care and does not currently meet [facility] involuntary commitment criteria.  Psychiatry consult service to sign off at this time.  The above communications have been discussed with primary team.  Disposition: No evidence of imminent risk to self or others at present.   Patient does not meet criteria for psychiatric inpatient admission. Refer to IOP. Discussed crisis plan, support from social network, calling 911, coming to the Emergency Department, and calling Suicide Hotline.  Maryagnes Amos, FNP 02/17/2023 9:00 AM

## 2023-02-17 NOTE — Progress Notes (Signed)
PROGRESS NOTE  Michael [name] ZOX:[id number] DOB: 01/20/1951 DOA: 02/14/2023 PCP: [name], MD   LOS: 2 days   Brief Narrative / Interim history: 72 year old male with history of HIV, homelessness, ongoing cocaine abuse, PAF, CKD stage IV, CAD status post CABG, IDDM who comes into the hospital with community-acquired pneumonia as well as suicidal ideation.  He is well-known to our system with frequent ED visits, this being his 11th visit in the past month.  He presents currently with a cough for the last 2 weeks, cough being productive.  He is nonadherent to any of his prescribed medications and continues to have a UDS positive for cocaine.  Subjective / 24h Interval events: Still has some cough and is feeling weak.  Assesement and Plan: Principal Problem:   CAP (community acquired pneumonia) Active Problems:   Suicidal ideations   Major depressive disorder, recurrent, severe without psychotic features ([name])   Cocaine use disorder, severe, dependence ([name])   Substance induced mood disorder ([name])   Type II diabetes mellitus with renal manifestations ([name])   AKI (acute kidney injury) ([name])  Principal problem Community-acquired pneumonia -as evidenced by his symptoms as well as a chest x-ray showing right lower lobe consolidation.  Patient has been placed on ceftriaxone and azithromycin, continue, today's day #4, plan for 5 days total -Continue support care.  He is on room air.  Problems AKI on CKD stage IV -his baseline creatinine appears to be around 3.5.  Creatinine on presentation 3.8, increased to 4.0 on 5/11.  Continues to improve  Anemia, multifactorial -likely in the setting of underlying chronic kidney disease, untreated HIV, and also patient reports melena however his story is somewhat vague.  Has not had a bowel movement here.  He received 2 units of packed red blood cells and hemoglobin has improved appropriately and has remained stable.  Non-anion gap metabolic  acidosis -due to renal failure.  Continue sodium bicarb  History of HIV -noncompliant to his home medications.  Discussed with ID over the weekend, patient is back on his home [place] (or hospital equivalent)  Thrombocytopenia -chronic, monitor.  CAD status post CABG -no chest pain.  Unfortunately he is not adhering to any of his home meds  Cocaine use disorder -counseled for cessation  Suicidal ideation -frequently presents with these, psychiatry consulted.  They recommend inpatient psychiatric admission.  Appreciate follow-up.  Should be stable for discharge in the next 1 to 2 days if his hemoglobin remains stable   Essential hypertension -continue diltiazem, Imdur  PAF -continue diltiazem, not a good candidate for anticoagulation due to homelessness and nonadherence   Diabetes type 2 with renal manifestation - SSI  Scheduled Meds:  sodium chloride   Intravenous Once   aspirin  81 mg Oral Daily   atorvastatin  40 mg Oral Daily   diltiazem  180 mg Oral Daily   docusate sodium  100 mg Oral BID   dolutegravir  50 mg Oral QAC lunch   And   rilpivirine  25 mg Oral QAC lunch   DULoxetine  30 mg Oral Daily   fluticasone  2 spray Each Nare Daily   insulin aspart  0-15 Units Subcutaneous TID WC   insulin aspart  0-5 Units Subcutaneous QHS   isosorbide mononitrate  15 mg Oral Daily   pantoprazole  40 mg Oral Daily   QUEtiapine  50 mg Oral QHS   sodium bicarbonate  650 mg Oral TID   Continuous Infusions:  azithromycin 500 mg (02/17/23  1610)   cefTRIAXone (ROCEPHIN)  IV 1 g (02/17/23 0805)   PRN Meds:.acetaminophen **OR** acetaminophen, albuterol, guaiFENesin-dextromethorphan, hydrALAZINE, ondansetron **OR** ondansetron (ZOFRAN) IV, polyethylene glycol  Current Outpatient Medications  Medication Instructions   albuterol (VENTOLIN HFA) 108 (90 Base) MCG/ACT inhaler 1-2 puffs, Inhalation, Every 6 hours PRN   aspirin 81 MG chewable tablet Daily   atorvastatin (LIPITOR) 40 mg, Oral,  Daily   b complex-vitamin c-folic acid (NEPHRO-VITE) 0.8 MG TABS tablet 1 tablet, Daily   diltiazem (CARDIZEM CD) 180 mg, Oral, Daily   ferrous sulfate 325 mg, Oral, Daily with breakfast   FLUoxetine (PROZAC) 20 mg, Oral, Daily   fluticasone furoate-vilanterol (BREO ELLIPTA) 200-25 MCG/ACT AEPB 1 puff, Inhalation, Daily   folic acid (FOLVITE) 1 mg, Oral, Daily   furosemide (LASIX) 20 mg, Oral, Daily   hydrALAZINE (APRESOLINE) 25 mg, Oral, Every 8 hours   isosorbide mononitrate (IMDUR) 15 mg, Oral, Daily   nitroGLYCERIN (NITROSTAT) 0.4 mg, Sublingual, Every 5 min PRN   pantoprazole (PROTONIX) 40 mg, Oral, Daily   polyethylene glycol (MIRALAX / GLYCOLAX) 17 g, Oral, 2 times daily   QUEtiapine (SEROQUEL) 50 mg, Oral, Daily at bedtime   sodium bicarbonate 650 mg, Oral, 3 times daily    Diet Orders (From admission, onward)     Start     Ordered   02/14/23 1351  Diet Carb Modified Fluid consistency: Thin; Room service appropriate? Yes  Diet effective now       Question Answer Comment  Diet-HS Snack? Nothing   Calorie Level Medium 1600-2000   Fluid consistency: Thin   Room service appropriate? Yes      02/14/23 1350            DVT prophylaxis: Place TED hose Start: 02/15/23 0718 SCDs Start: 02/14/23 1148   Lab Results  Component Value Date   PLT 110 (L) 02/17/2023      Code Status: Full Code  Family Communication: no family at bedside   Status is: Inpatient  Level of care: Med-Surg  Consultants:  none  Objective: Vitals:   02/16/23 1106 02/16/23 1315 02/16/23 1952 02/17/23 0442  BP: (!) 143/88 (!) 148/95 (!) 159/90 (!) 162/95  Pulse: 73 82 77 71  Resp: 18 18 20 18   Temp: 98.3 F (36.8 C) (!) 97.4 F (36.3 C) 98.5 F (36.9 C) 98.2 F (36.8 C)  TempSrc: Oral Oral Oral Oral  SpO2: 100% 100% 97% 99%  Weight:      Height:        Intake/Output Summary (Last 24 hours) at 02/17/2023 1001 Last data filed at 02/17/2023 0857 Gross per 24 hour  Intake 726 ml   Output 1375 ml  Net -649 ml    Wt Readings from Last 3 Encounters:  02/14/23 72.6 kg  02/10/23 75 kg  02/02/23 74.4 kg    Examination:  Constitutional: NAD Eyes: lids and conjunctivae normal, no scleral icterus ENMT: mmm Neck: normal, supple Respiratory: clear to auscultation bilaterally, no wheezing, no crackles. Normal respiratory effort.  Cardiovascular: Regular rate and rhythm, no murmurs / rubs / gallops. No LE edema. Abdomen: soft, no distention, no tenderness. Bowel sounds positive.  Skin: no rashes  Data Reviewed: I have independently reviewed following labs and imaging studies   CBC Recent Labs  Lab 02/10/23 1102 02/14/23 0959 02/15/23 0532 02/16/23 0534 02/17/23 0500  WBC 3.9* 7.0 5.4 5.0 5.8  HGB 7.9* 7.9* 6.1* 6.9* 8.4*  HCT 25.1* 25.6* 20.0* 22.1* 25.8*  PLT 109* 114* 105* 108*  110*  MCV 90.0 92.1 93.9 92.5 89.6  MCH 28.3 28.4 28.6 28.9 29.2  MCHC 31.5 30.9 30.5 31.2 32.6  RDW 13.9 14.0 14.2 14.7 14.7  LYMPHSABS  --  0.9  --   --   --   MONOABS  --  0.4  --   --   --   EOSABS  --  0.1  --   --   --   BASOSABS  --  0.0  --   --   --      Recent Labs  Lab 02/10/23 1102 02/14/23 0959 02/15/23 0532 02/16/23 0534 02/17/23 0500  NA 139 142 140 141 137  K 4.7 5.0 4.5 4.2 4.2  CL 113* 117* 116* 114* 109  CO2 17* 13* 16* 17* 20*  GLUCOSE 159* 101* 122* 104* 112*  BUN 64* 78* 78* 67* 59*  CREATININE 3.39* 3.80* 4.04* 3.95* 3.82*  CALCIUM 8.0* 7.9* 7.5* 7.7* 7.7*  AST  --  66*  --  18  --   ALT  --  115*  --  61*  --   ALKPHOS  --  101  --  70  --   BILITOT  --  0.3  --  0.5  --   ALBUMIN  --  3.2*  --  2.5*  --   MG  --   --   --  1.4* 1.7  BNP  --  662.2*  --   --   --      ------------------------------------------------------------------------------------------------------------------ No results for input(s): "CHOL", "HDL", "LDLCALC", "TRIG", "CHOLHDL", "LDLDIRECT" in the last 72 hours.  Lab Results  Component Value Date   HGBA1C 6.8  (H) 09/30/2022   ------------------------------------------------------------------------------------------------------------------ No results for input(s): "TSH", "T4TOTAL", "T3FREE", "THYROIDAB" in the last 72 hours.  Invalid input(s): "FREET3"  Cardiac Enzymes No results for input(s): "CKMB", "TROPONINI", "MYOGLOBIN" in the last 168 hours.  Invalid input(s): "CK" ------------------------------------------------------------------------------------------------------------------    Component Value Date/Time   BNP 662.2 (H) 02/14/2023 0959    CBG: Recent Labs  Lab 02/16/23 0731 02/16/23 1241 02/16/23 1720 02/16/23 2101 02/17/23 0720  GLUCAP 117* 117* 118* 194* 144*     Recent Results (from the past 240 hour(s))  Resp panel by RT-PCR (RSV, Flu A&B, Covid) Anterior Nasal Swab     Status: None   Collection Time: 02/14/23  8:57 AM   Specimen: Anterior Nasal Swab  Result Value Ref Range Status   SARS Coronavirus 2 by RT PCR NEGATIVE NEGATIVE Final    Comment: (NOTE) SARS-CoV-2 target nucleic acids are NOT DETECTED.  The SARS-CoV-2 RNA is generally detectable in upper respiratory specimens during the acute phase of infection. The lowest concentration of SARS-CoV-2 viral copies this assay can detect is 138 copies/mL. A negative result does not preclude SARS-Cov-2 infection and should not be used as the sole basis for treatment or other patient management decisions. A negative result may occur with  improper specimen collection/handling, submission of specimen other than nasopharyngeal swab, presence of viral mutation(s) within the areas targeted by this assay, and inadequate number of viral copies(<138 copies/mL). A negative result must be combined with clinical observations, patient history, and epidemiological information. The expected result is Negative.  Fact Sheet for Patients:  BloggerCourse.com  Fact Sheet for Healthcare Providers:   SeriousBroker.it  This test is no t yet approved or cleared by the [place] FDA and  has been authorized for detection and/or diagnosis of SARS-CoV-2 by FDA under an Emergency Use Authorization (EUA). This  EUA will remain  in effect (meaning this test can be used) for the duration of the COVID-19 declaration under Section 564(b)(1) of the Act, 21 U.S.C.section 360bbb-3(b)(1), unless the authorization is terminated  or revoked sooner.       Influenza A by PCR NEGATIVE NEGATIVE Final   Influenza B by PCR NEGATIVE NEGATIVE Final    Comment: (NOTE) The Xpert Xpress SARS-CoV-2/FLU/RSV plus assay is intended as an aid in the diagnosis of influenza from Nasopharyngeal swab specimens and should not be used as a sole basis for treatment. Nasal washings and aspirates are unacceptable for Xpert Xpress SARS-CoV-2/FLU/RSV testing.  Fact Sheet for Patients: BloggerCourse.com  Fact Sheet for Healthcare Providers: SeriousBroker.it  This test is not yet approved or cleared by the [place] FDA and has been authorized for detection and/or diagnosis of SARS-CoV-2 by FDA under an Emergency Use Authorization (EUA). This EUA will remain in effect (meaning this test can be used) for the duration of the COVID-19 declaration under Section 564(b)(1) of the Act, 21 U.S.C. section 360bbb-3(b)(1), unless the authorization is terminated or revoked.     Resp Syncytial Virus by PCR NEGATIVE NEGATIVE Final    Comment: (NOTE) Fact Sheet for Patients: BloggerCourse.com  Fact Sheet for Healthcare Providers: SeriousBroker.it  This test is not yet approved or cleared by the [place] FDA and has been authorized for detection and/or diagnosis of SARS-CoV-2 by FDA under an Emergency Use Authorization (EUA). This EUA will remain in effect (meaning this test can be used) for  the duration of the COVID-19 declaration under Section 564(b)(1) of the Act, 21 U.S.C. section 360bbb-3(b)(1), unless the authorization is terminated or revoked.  Performed at [facility], [address], [place], [place] [address]      Radiology Studies: No results found.   Pamella Pert, MD, PhD [facility]  Between 7 am - 7 pm I am available, please contact me via Amion (for emergencies) or Securechat (non urgent messages)  Between 7 pm - 7 am I am not available, please contact night coverage MD/APP via Amion

## 2023-02-18 DIAGNOSIS — J189 Pneumonia, unspecified organism: Secondary | ICD-10-CM | POA: Diagnosis not present

## 2023-02-18 LAB — CBC
HCT: 25.7 % — ABNORMAL LOW (ref 39.0–52.0)
Hemoglobin: 8.1 g/dL — ABNORMAL LOW (ref 13.0–17.0)
MCH: 28.7 pg (ref 26.0–34.0)
MCHC: 31.5 g/dL (ref 30.0–36.0)
MCV: 91.1 fL (ref 80.0–100.0)
Platelets: 127 10*3/uL — ABNORMAL LOW (ref 150–400)
RBC: 2.82 MIL/uL — ABNORMAL LOW (ref 4.22–5.81)
RDW: 14.5 % (ref 11.5–15.5)
WBC: 5.4 10*3/uL (ref 4.0–10.5)
nRBC: 0 % (ref 0.0–0.2)

## 2023-02-18 LAB — GLUCOSE, CAPILLARY
Glucose-Capillary: 96 mg/dL (ref 70–99)
Glucose-Capillary: 147 mg/dL — ABNORMAL HIGH (ref 70–99)
Glucose-Capillary: 140 mg/dL — ABNORMAL HIGH (ref 70–99)
Glucose-Capillary: 97 mg/dL (ref 70–99)

## 2023-02-18 LAB — BASIC METABOLIC PANEL
Anion gap: 8 (ref 5–15)
BUN: 55 mg/dL — ABNORMAL HIGH (ref 8–23)
CO2: 21 mmol/L — ABNORMAL LOW (ref 22–32)
Calcium: 7.6 mg/dL — ABNORMAL LOW (ref 8.9–10.3)
Chloride: 112 mmol/L — ABNORMAL HIGH (ref 98–111)
Creatinine, Ser: 3.89 mg/dL — ABNORMAL HIGH (ref 0.61–1.24)
GFR, Estimated: 16 mL/min — ABNORMAL LOW (ref >60)
Glucose, Bld: 116 mg/dL — ABNORMAL HIGH (ref 70–99)
Potassium: 4.5 mmol/L (ref 3.5–5.1)
Sodium: 141 mmol/L (ref 135–145)

## 2023-02-18 MED ORDER — SENNOSIDES-DOCUSATE SODIUM 8.6-50 MG PO TABS
2.0000 | ORAL_TABLET | Freq: Two times a day (BID) | ORAL | Status: DC
  Administered 2023-02-18 – 2023-02-19 (×3): 2 via ORAL
  Filled 2023-02-18 (×3): qty 2

## 2023-02-18 NOTE — Progress Notes (Signed)
PROGRESS NOTE  Michael [name] GNF:[id number] DOB: Aug 03, 1951 DOA: 02/14/2023 PCP: [name], MD   LOS: 3 days   Brief Narrative / Interim history: 72 year old male with history of HIV, homelessness, ongoing cocaine abuse, PAF, CKD stage IV, CAD status post CABG, IDDM who comes into the hospital with community-acquired pneumonia as well as suicidal ideation.  He is well-known to our system with frequent ED visits, this being his 11th visit in the past month.  He presents currently with a cough for the last 2 weeks, cough being productive.  He is nonadherent to any of his prescribed medications and continues to have a UDS positive for cocaine.  Subjective / 24h Interval events: Continued complaints of weakness as well as a persistent cough.  Assesement and Plan: Principal Problem:   CAP (community acquired pneumonia) Active Problems:   Suicidal ideations   Major depressive disorder, recurrent, severe without psychotic features ([name])   Cocaine use disorder, severe, dependence ([name])   Substance induced mood disorder ([name])   Type II diabetes mellitus with renal manifestations ([name])   AKI (acute kidney injury) ([name])  Principal problem Community-acquired pneumonia -as evidenced by his symptoms as well as a chest x-ray showing right lower lobe consolidation.  Patient has been placed on ceftriaxone and azithromycin, continue, today's last day of antibiotics -Continue supportive care -Due to persistent reported weakness, PT consult  Problems AKI on CKD stage IV -his baseline creatinine appears to be around 3.5.  Creatinine on presentation 3.8, increased to 4.0 on 5/11.  Creatinine has improved to around 3.8, and has remained stable.  I wonder whether this is his new baseline.  Anemia, multifactorial -likely in the setting of underlying chronic kidney disease, untreated HIV, and also patient reports melena however his story is somewhat vague.  Has not had a bowel movement here.  He  received 2 units of packed red blood cells and hemoglobin has improved appropriately and has remained stable, mild downward drift today.  Continue to monitor BMs and hemoglobin -Ideally would like him on a PPI however that is contraindicated with his anti-HIV medications.  Discussed with pharmacy  Non-anion gap metabolic acidosis -due to renal failure.  I have started sodium bicarb  History of HIV -noncompliant to his home medications.  Discussed with ID over the weekend, patient is back on his home [place] (or hospital equivalent)  Thrombocytopenia -chronic, monitor.  CAD status post CABG -no chest pain.  Unfortunately he is not adhering to any of his home meds  Cocaine use disorder -counseled for cessation  Suicidal ideation -frequently presents with these, psychiatry consulted.  They recommend inpatient psychiatric admission initially, however on reevaluation he has been psychiatrically cleared   Essential hypertension -continue diltiazem, Imdur  PAF -continue diltiazem, not a good candidate for anticoagulation due to homelessness and nonadherence   Diabetes type 2 with renal manifestation - SSI  Disposition -patient is homeless, currently living in [facility] however he does not wish to return there.  He is thinking about going to [facility].  Social worker consulted  Scheduled Meds:  sodium chloride   Intravenous Once   aspirin  81 mg Oral Daily   atorvastatin  40 mg Oral Daily   diltiazem  180 mg Oral Daily   docusate sodium  100 mg Oral BID   dolutegravir  50 mg Oral QAC lunch   And   rilpivirine  25 mg Oral QAC lunch   DULoxetine  30 mg Oral Daily  famotidine  10 mg Oral Q48H   fluticasone  2 spray Each Nare Daily   insulin aspart  0-15 Units Subcutaneous TID WC   insulin aspart  0-5 Units Subcutaneous QHS   isosorbide mononitrate  15 mg Oral Daily   QUEtiapine  50 mg Oral QHS   sodium bicarbonate  650 mg Oral TID   Continuous Infusions:   azithromycin 500 mg (02/17/23 0952)   PRN Meds:.acetaminophen **OR** acetaminophen, albuterol, guaiFENesin-dextromethorphan, hydrALAZINE, ondansetron **OR** ondansetron (ZOFRAN) IV, polyethylene glycol  Current Outpatient Medications  Medication Instructions   albuterol (VENTOLIN HFA) 108 (90 Base) MCG/ACT inhaler 1-2 puffs, Inhalation, Every 6 hours PRN   aspirin 81 MG chewable tablet Daily   atorvastatin (LIPITOR) 40 mg, Oral, Daily   b complex-vitamin c-folic acid (NEPHRO-VITE) 0.8 MG TABS tablet 1 tablet, Daily   diltiazem (CARDIZEM CD) 180 mg, Oral, Daily   ferrous sulfate 325 mg, Oral, Daily with breakfast   FLUoxetine (PROZAC) 20 mg, Oral, Daily   fluticasone furoate-vilanterol (BREO ELLIPTA) 200-25 MCG/ACT AEPB 1 puff, Inhalation, Daily   folic acid (FOLVITE) 1 mg, Oral, Daily   furosemide (LASIX) 20 mg, Oral, Daily   hydrALAZINE (APRESOLINE) 25 mg, Oral, Every 8 hours   isosorbide mononitrate (IMDUR) 15 mg, Oral, Daily   nitroGLYCERIN (NITROSTAT) 0.4 mg, Sublingual, Every 5 min PRN   pantoprazole (PROTONIX) 40 mg, Oral, Daily   polyethylene glycol (MIRALAX / GLYCOLAX) 17 g, Oral, 2 times daily   QUEtiapine (SEROQUEL) 50 mg, Oral, Daily at bedtime   sodium bicarbonate 650 mg, Oral, 3 times daily    Diet Orders (From admission, onward)     Start     Ordered   02/14/23 1351  Diet Carb Modified Fluid consistency: Thin; Room service appropriate? Yes  Diet effective now       Question Answer Comment  Diet-HS Snack? Nothing   Calorie Level Medium 1600-2000   Fluid consistency: Thin   Room service appropriate? Yes      02/14/23 1350            DVT prophylaxis: Place TED hose Start: 02/15/23 0718 SCDs Start: 02/14/23 1148   Lab Results  Component Value Date   PLT 127 (L) 02/18/2023      Code Status: Full Code  Family Communication: no family at bedside   Status is: Inpatient  Level of care: Med-Surg  Consultants:  none  Objective: Vitals:   02/17/23  0442 02/17/23 1428 02/17/23 2022 02/18/23 0641  BP: (!) 162/95 (!) 152/94 (!) 152/91 (!) 166/99  Pulse: 71 74 72 73  Resp: 18 20 18 16   Temp: 98.2 F (36.8 C) 98.3 F (36.8 C) 98.6 F (37 C) 98.8 F (37.1 C)  TempSrc: Oral Oral  Oral  SpO2: 99% 100% 98% 96%  Weight:      Height:        Intake/Output Summary (Last 24 hours) at 02/18/2023 1111 Last data filed at 02/18/2023 1610 Gross per 24 hour  Intake 1092.57 ml  Output 800 ml  Net 292.57 ml    Wt Readings from Last 3 Encounters:  02/14/23 72.6 kg  02/10/23 75 kg  02/02/23 74.4 kg    Examination:  Constitutional: NAD Eyes: lids and conjunctivae normal, no scleral icterus ENMT: mmm Neck: normal, supple Respiratory: clear to auscultation bilaterally, no wheezing, no crackles. Normal respiratory effort.  Cardiovascular: Regular rate and rhythm, no murmurs / rubs / gallops. No LE edema. Abdomen: soft, no distention, no tenderness. Bowel sounds positive.  Data Reviewed: I have independently reviewed following labs and imaging studies   CBC Recent Labs  Lab 02/14/23 0959 02/15/23 0532 02/16/23 0534 02/17/23 0500 02/18/23 0535  WBC 7.0 5.4 5.0 5.8 5.4  HGB 7.9* 6.1* 6.9* 8.4* 8.1*  HCT 25.6* 20.0* 22.1* 25.8* 25.7*  PLT 114* 105* 108* 110* 127*  MCV 92.1 93.9 92.5 89.6 91.1  MCH 28.4 28.6 28.9 29.2 28.7  MCHC 30.9 30.5 31.2 32.6 31.5  RDW 14.0 14.2 14.7 14.7 14.5  LYMPHSABS 0.9  --   --   --   --   MONOABS 0.4  --   --   --   --   EOSABS 0.1  --   --   --   --   BASOSABS 0.0  --   --   --   --      Recent Labs  Lab 02/14/23 0959 02/15/23 0532 02/16/23 0534 02/17/23 0500 02/18/23 0535  NA 142 140 141 137 141  K 5.0 4.5 4.2 4.2 4.5  CL 117* 116* 114* 109 112*  CO2 13* 16* 17* 20* 21*  GLUCOSE 101* 122* 104* 112* 116*  BUN 78* 78* 67* 59* 55*  CREATININE 3.80* 4.04* 3.95* 3.82* 3.89*  CALCIUM 7.9* 7.5* 7.7* 7.7* 7.6*  AST 66*  --  18  --   --   ALT 115*  --  61*  --   --   ALKPHOS 101  --  70   --   --   BILITOT 0.3  --  0.5  --   --   ALBUMIN 3.2*  --  2.5*  --   --   MG  --   --  1.4* 1.7  --   BNP 662.2*  --   --   --   --      ------------------------------------------------------------------------------------------------------------------ No results for input(s): "CHOL", "HDL", "LDLCALC", "TRIG", "CHOLHDL", "LDLDIRECT" in the last 72 hours.  Lab Results  Component Value Date   HGBA1C 6.8 (H) 09/30/2022   ------------------------------------------------------------------------------------------------------------------ No results for input(s): "TSH", "T4TOTAL", "T3FREE", "THYROIDAB" in the last 72 hours.  Invalid input(s): "FREET3"  Cardiac Enzymes No results for input(s): "CKMB", "TROPONINI", "MYOGLOBIN" in the last 168 hours.  Invalid input(s): "CK" ------------------------------------------------------------------------------------------------------------------    Component Value Date/Time   BNP 662.2 (H) 02/14/2023 0959    CBG: Recent Labs  Lab 02/17/23 0720 02/17/23 1133 02/17/23 1637 02/17/23 2023 02/18/23 0724  GLUCAP 144* 120* 138* 147* 96     Recent Results (from the past 240 hour(s))  Resp panel by RT-PCR (RSV, Flu A&B, Covid) Anterior Nasal Swab     Status: None   Collection Time: 02/14/23  8:57 AM   Specimen: Anterior Nasal Swab  Result Value Ref Range Status   SARS Coronavirus 2 by RT PCR NEGATIVE NEGATIVE Final    Comment: (NOTE) SARS-CoV-2 target nucleic acids are NOT DETECTED.  The SARS-CoV-2 RNA is generally detectable in upper respiratory specimens during the acute phase of infection. The lowest concentration of SARS-CoV-2 viral copies this assay can detect is 138 copies/mL. A negative result does not preclude SARS-Cov-2 infection and should not be used as the sole basis for treatment or other patient management decisions. A negative result may occur with  improper specimen collection/handling, submission of specimen other than  nasopharyngeal swab, presence of viral mutation(s) within the areas targeted by this assay, and inadequate number of viral copies(<138 copies/mL). A negative result must be combined with clinical observations, patient history, and epidemiological information.  The expected result is Negative.  Fact Sheet for Patients:  BloggerCourse.com  Fact Sheet for Healthcare Providers:  SeriousBroker.it  This test is no t yet approved or cleared by the [place] FDA and  has been authorized for detection and/or diagnosis of SARS-CoV-2 by FDA under an Emergency Use Authorization (EUA). This EUA will remain  in effect (meaning this test can be used) for the duration of the COVID-19 declaration under Section 564(b)(1) of the Act, 21 U.S.C.section 360bbb-3(b)(1), unless the authorization is terminated  or revoked sooner.       Influenza A by PCR NEGATIVE NEGATIVE Final   Influenza B by PCR NEGATIVE NEGATIVE Final    Comment: (NOTE) The Xpert Xpress SARS-CoV-2/FLU/RSV plus assay is intended as an aid in the diagnosis of influenza from Nasopharyngeal swab specimens and should not be used as a sole basis for treatment. Nasal washings and aspirates are unacceptable for Xpert Xpress SARS-CoV-2/FLU/RSV testing.  Fact Sheet for Patients: BloggerCourse.com  Fact Sheet for Healthcare Providers: SeriousBroker.it  This test is not yet approved or cleared by the [place] FDA and has been authorized for detection and/or diagnosis of SARS-CoV-2 by FDA under an Emergency Use Authorization (EUA). This EUA will remain in effect (meaning this test can be used) for the duration of the COVID-19 declaration under Section 564(b)(1) of the Act, 21 U.S.C. section 360bbb-3(b)(1), unless the authorization is terminated or revoked.     Resp Syncytial Virus by PCR NEGATIVE NEGATIVE Final    Comment:  (NOTE) Fact Sheet for Patients: BloggerCourse.com  Fact Sheet for Healthcare Providers: SeriousBroker.it  This test is not yet approved or cleared by the [place] FDA and has been authorized for detection and/or diagnosis of SARS-CoV-2 by FDA under an Emergency Use Authorization (EUA). This EUA will remain in effect (meaning this test can be used) for the duration of the COVID-19 declaration under Section 564(b)(1) of the Act, 21 U.S.C. section 360bbb-3(b)(1), unless the authorization is terminated or revoked.  Performed at [facility], [address] [place], [place] [address]      Radiology Studies: No results found.   Pamella Pert, MD, PhD [facility]  Between 7 am - 7 pm I am available, please contact me via Amion (for emergencies) or Securechat (non urgent messages)  Between 7 pm - 7 am I am not available, please contact night coverage MD/APP via Amion

## 2023-02-18 NOTE — Progress Notes (Signed)
Mobility Specialist - Progress Note   02/18/23 0944  Mobility  Activity Ambulated with assistance in hallway  Level of Assistance Standby assist, set-up cues, supervision of patient - no hands on  Assistive Device Front wheel walker  Distance Ambulated (ft) 60 ft  Range of Motion/Exercises Active  Activity Response Tolerated well  Mobility Referral Yes  $Mobility charge 1 Mobility  Mobility Specialist Start Time (ACUTE ONLY) 0934  Mobility Specialist Stop Time (ACUTE ONLY) [id number]  Mobility Specialist Time Calculation (min) (ACUTE ONLY) 8 min   Pt received in bed and agreed to mobility, with c/o throwing up earlier this morning, some pain in LE, compared pain to stiffness.   Pt returned to chair with all needs met.  Marilynne Halsted Mobility Specialist

## 2023-02-18 NOTE — Progress Notes (Signed)
PT Cancellation Note  Patient Details Name: Michael [name] MRN: [id number] DOB: Jan 20, 1951   Cancelled Treatment:    Reason Eval/Treat Not Completed: Medical issues which prohibited therapy States he is having stomach issues. Will check back. Blanchard Kelch PT Acute Rehabilitation Services Office [phone] Weekend [phone]   Rada Hay 02/18/2023, 4:26 PM

## 2023-02-19 ENCOUNTER — Other Ambulatory Visit (HOSPITAL_COMMUNITY): Payer: Self-pay

## 2023-02-19 DIAGNOSIS — N1832 Chronic kidney disease, stage 3b: Secondary | ICD-10-CM

## 2023-02-19 DIAGNOSIS — F1420 Cocaine dependence, uncomplicated: Secondary | ICD-10-CM | POA: Diagnosis not present

## 2023-02-19 DIAGNOSIS — R45851 Suicidal ideations: Secondary | ICD-10-CM | POA: Diagnosis not present

## 2023-02-19 DIAGNOSIS — N179 Acute kidney failure, unspecified: Secondary | ICD-10-CM | POA: Diagnosis not present

## 2023-02-19 DIAGNOSIS — J189 Pneumonia, unspecified organism: Secondary | ICD-10-CM | POA: Diagnosis not present

## 2023-02-19 DIAGNOSIS — F1994 Other psychoactive substance use, unspecified with psychoactive substance-induced mood disorder: Secondary | ICD-10-CM

## 2023-02-19 DIAGNOSIS — Z794 Long term (current) use of insulin: Secondary | ICD-10-CM

## 2023-02-19 DIAGNOSIS — F332 Major depressive disorder, recurrent severe without psychotic features: Secondary | ICD-10-CM

## 2023-02-19 DIAGNOSIS — E1122 Type 2 diabetes mellitus with diabetic chronic kidney disease: Secondary | ICD-10-CM

## 2023-02-19 LAB — COMPREHENSIVE METABOLIC PANEL
ALT: 37 U/L (ref 0–44)
AST: 15 U/L (ref 15–41)
Albumin: 2.7 g/dL — ABNORMAL LOW (ref 3.5–5.0)
Alkaline Phosphatase: 71 U/L (ref 38–126)
Anion gap: 8 (ref 5–15)
BUN: 47 mg/dL — ABNORMAL HIGH (ref 8–23)
CO2: 20 mmol/L — ABNORMAL LOW (ref 22–32)
Calcium: 7.9 mg/dL — ABNORMAL LOW (ref 8.9–10.3)
Chloride: 111 mmol/L (ref 98–111)
Creatinine, Ser: 3.76 mg/dL — ABNORMAL HIGH (ref 0.61–1.24)
GFR, Estimated: 16 mL/min — ABNORMAL LOW (ref >60)
Glucose, Bld: 83 mg/dL (ref 70–99)
Potassium: 4.9 mmol/L (ref 3.5–5.1)
Sodium: 139 mmol/L (ref 135–145)
Total Bilirubin: 0.5 mg/dL (ref 0.3–1.2)
Total Protein: 6.5 g/dL (ref 6.5–8.1)

## 2023-02-19 LAB — CBC
HCT: 27 % — ABNORMAL LOW (ref 39.0–52.0)
Hemoglobin: 8.6 g/dL — ABNORMAL LOW (ref 13.0–17.0)
MCH: 29.1 pg (ref 26.0–34.0)
MCHC: 31.9 g/dL (ref 30.0–36.0)
MCV: 91.2 fL (ref 80.0–100.0)
Platelets: 140 10*3/uL — ABNORMAL LOW (ref 150–400)
RBC: 2.96 MIL/uL — ABNORMAL LOW (ref 4.22–5.81)
RDW: 14.2 % (ref 11.5–15.5)
WBC: 6 10*3/uL (ref 4.0–10.5)
nRBC: 0 % (ref 0.0–0.2)

## 2023-02-19 LAB — GLUCOSE, CAPILLARY
Glucose-Capillary: 73 mg/dL (ref 70–99)
Glucose-Capillary: 87 mg/dL (ref 70–99)
Glucose-Capillary: 135 mg/dL — ABNORMAL HIGH (ref 70–99)

## 2023-02-19 LAB — MAGNESIUM: Magnesium: 1.6 mg/dL — ABNORMAL LOW (ref 1.7–2.4)

## 2023-02-19 MED ORDER — QUETIAPINE FUMARATE 50 MG PO TABS
50.0000 mg | ORAL_TABLET | Freq: Every day | ORAL | 0 refills | Status: DC
  Filled 2023-02-19: qty 30, 30d supply, fill #0

## 2023-02-19 MED ORDER — ASPIRIN 81 MG PO CHEW
81.0000 mg | CHEWABLE_TABLET | Freq: Every day | ORAL | 0 refills | Status: DC
  Filled 2023-02-19: qty 30, 30d supply, fill #0

## 2023-02-19 MED ORDER — FLUTICASONE FUROATE-VILANTEROL 200-25 MCG/ACT IN AEPB
1.0000 | INHALATION_SPRAY | Freq: Every day | RESPIRATORY_TRACT | 0 refills | Status: DC
  Filled 2023-02-19: qty 60, 30d supply, fill #0

## 2023-02-19 MED ORDER — ALBUTEROL SULFATE HFA 108 (90 BASE) MCG/ACT IN AERS
1.0000 | INHALATION_SPRAY | Freq: Four times a day (QID) | RESPIRATORY_TRACT | 0 refills | Status: DC | PRN
  Filled 2023-02-19: qty 6.7, 25d supply, fill #0

## 2023-02-19 MED ORDER — DULOXETINE HCL 30 MG PO CPEP
30.0000 mg | ORAL_CAPSULE | Freq: Every day | ORAL | 0 refills | Status: DC
  Filled 2023-02-19: qty 30, 30d supply, fill #0

## 2023-02-19 MED ORDER — ATORVASTATIN CALCIUM 40 MG PO TABS
40.0000 mg | ORAL_TABLET | Freq: Every day | ORAL | 0 refills | Status: DC
  Filled 2023-02-19: qty 30, 30d supply, fill #0

## 2023-02-19 MED ORDER — DILTIAZEM HCL ER COATED BEADS 180 MG PO CP24
180.0000 mg | ORAL_CAPSULE | Freq: Every day | ORAL | 0 refills | Status: DC
  Filled 2023-02-19: qty 30, 30d supply, fill #0

## 2023-02-19 MED ORDER — FLUTICASONE PROPIONATE 50 MCG/ACT NA SUSP
2.0000 | Freq: Every day | NASAL | 0 refills | Status: DC
  Filled 2023-02-19: qty 16, 30d supply, fill #0

## 2023-02-19 MED ORDER — MAGNESIUM OXIDE -MG SUPPLEMENT 400 (240 MG) MG PO TABS
800.0000 mg | ORAL_TABLET | Freq: Every day | ORAL | Status: DC
  Administered 2023-02-19: 800 mg via ORAL
  Filled 2023-02-19: qty 2

## 2023-02-19 MED ORDER — SODIUM BICARBONATE 650 MG PO TABS
650.0000 mg | ORAL_TABLET | Freq: Three times a day (TID) | ORAL | 0 refills | Status: DC
  Filled 2023-02-19: qty 90, 30d supply, fill #0

## 2023-02-19 MED ORDER — ISOSORBIDE MONONITRATE ER 30 MG PO TB24
30.0000 mg | ORAL_TABLET | Freq: Every day | ORAL | 0 refills | Status: DC
  Filled 2023-02-19: qty 30, 30d supply, fill #0

## 2023-02-19 MED ORDER — NITROGLYCERIN 0.4 MG SL SUBL
0.4000 mg | SUBLINGUAL_TABLET | SUBLINGUAL | 0 refills | Status: AC | PRN
  Filled 2023-02-19: qty 25, 8d supply, fill #0

## 2023-02-19 MED ORDER — DOLUTEGRAVIR SODIUM 50 MG PO TABS
50.0000 mg | ORAL_TABLET | Freq: Every day | ORAL | 0 refills | Status: DC
  Filled 2023-02-19: qty 30, 30d supply, fill #0

## 2023-02-19 MED ORDER — RILPIVIRINE HCL 25 MG PO TABS
25.0000 mg | ORAL_TABLET | Freq: Every day | ORAL | 0 refills | Status: DC
  Filled 2023-02-19: qty 26, 26d supply, fill #0

## 2023-02-19 NOTE — Evaluation (Signed)
Physical Therapy Evaluation Patient Details Name: Michael [name] MRN: [id number] DOB: 06-24-1951 Today's Date: 02/19/2023  History of Present Illness  72 year old male with history of HIV, homelessness, ongoing cocaine abuse, PAF, CKD stage IV, CAD status post CABG, IDDM who comes into the hospital 02/14/23  with community-acquired pneumonia as well as suicidal ideation. He is well-known to our system with frequent ED visits, this being his 11th visit in the past month. He presents currently with a cough for the last 2 weeks, cough being productive. He is nonadherent to any of his prescribed medications and continues to have a UDS positive for cocaine.  Clinical Impression  Pt admitted with above diagnosis.  Pt currently with functional limitations due to the deficits listed below (see PT Problem List). Pt will benefit from acute skilled PT to increase their independence and safety with mobility to allow discharge.     The patient  presents wit weakness, requires RW and min assistance, after 35' , gait became unsteady and required increased support for balance. Patient  quite deconditioned, stating that he feels dizzy and  nausea. Continue PT for mobility.     Recommendations for follow up therapy are one component of a multi-disciplinary discharge planning process, led by the attending physician.  Recommendations may be updated based on patient status, additional functional criteria and insurance authorization.  Follow Up Recommendations       Assistance Recommended at Discharge Intermittent Supervision/Assistance  Patient can return home with the following  A little help with walking and/or transfers;A little help with bathing/dressing/bathroom;Assistance with cooking/housework;Assist for transportation;Help with stairs or ramp for entrance    Equipment Recommendations Rollator (4 wheels)  Recommendations for Other Services       Functional Status Assessment       Precautions /  Restrictions Precautions Precautions: Fall Precaution Comments: 3 falls in past 6 months, " Restrictions Weight Bearing Restrictions: No      Mobility  Bed Mobility Overal bed mobility: Modified Independent                  Transfers   Equipment used: Rolling walker (2 wheels) Transfers: Sit to/from Stand Sit to Stand: Min guard           General transfer comment: increased time and effort from bed    Ambulation/Gait Ambulation/Gait assistance: Min assist, Min guard, Mod assist Gait Distance (Feet): 70 Feet Assistive device: Rolling walker (2 wheels) Gait Pattern/deviations: Step-to pattern, Trunk flexed Gait velocity: decr     General Gait Details: gait  very slow, began to drag legs with decreased stepping and required increased support for balance  Stairs            Wheelchair Mobility    Modified Rankin (Stroke Patients Only)       Balance Overall balance assessment: History of Falls, Needs assistance   Sitting balance-Leahy Scale: Good     Standing balance support: Bilateral upper extremity supported, During functional activity, Reliant on assistive device for balance Standing balance-Leahy Scale: Fair Standing balance comment: static, but dynamic is impaired                             Pertinent Vitals/Pain Pain Assessment Pain Assessment: No/denies pain    Home Living                          Prior Function  Hand Dominance        Extremity/Trunk Assessment                Communication      Cognition Arousal/Alertness: Awake/alert Behavior During Therapy: Flat affect Overall Cognitive Status: Within Functional Limits for tasks assessed                                          General Comments      Exercises     Assessment/Plan    PT Assessment Patient needs continued PT services  PT Problem List Decreased balance;Decreased activity  tolerance;Decreased mobility       PT Treatment Interventions Gait training;Balance training;Patient/family education    PT Goals (Current goals can be found in the Care Plan section)  Acute Rehab PT Goals Patient Stated Goal: none stated PT Goal Formulation: With patient Time For Goal Achievement: 03/05/23 Potential to Achieve Goals: Good    Frequency Min 1X/week     Co-evaluation               AM-PAC PT "6 Clicks" Mobility  Outcome Measure Help needed turning from your back to your side while in a flat bed without using bedrails?: None Help needed moving from lying on your back to sitting on the side of a flat bed without using bedrails?: None Help needed moving to and from a bed to a chair (including a wheelchair)?: A Little Help needed standing up from a chair using your arms (e.g., wheelchair or bedside chair)?: A Lot Help needed to walk in hospital room?: A Lot Help needed climbing 3-5 steps with a railing? : Total 6 Click Score: 16    End of Session Equipment Utilized During Treatment: Gait belt Activity Tolerance: Patient limited by fatigue Patient left: in chair;with chair alarm set;with call bell/phone within reach;with nursing/sitter in room Nurse Communication: Mobility status PT Visit Diagnosis: Muscle weakness (generalized) (M62.81);History of falling (Z91.81)    Time: 2841-3244 PT Time Calculation (min) (ACUTE ONLY): 18 min   Charges:   PT Evaluation $PT Eval Low Complexity: 1 Low          Blanchard Kelch PT Acute Rehabilitation Services Office [phone] Weekend pager-[phone]   Rada Hay 02/19/2023, 11:30 AM

## 2023-02-19 NOTE — TOC Transition Note (Signed)
Transition of Care [facility]) - CM/SW Discharge Note   Patient Details  Name: Michael [name] MRN: [id number] Date of Birth: 10-10-1950  Transition of Care [facility] CM/SW Contact:  Erin Sons, LCSW Phone Number: 02/19/2023, 11:10 AM   Clinical Narrative:     Pt is no longer recommended for inpatient psych. Pt has been staying at [facility] shelter. He has been given list of [employer] and information for [employer]. RN informed CSW that pt plans to go to shelter in [place] at discharge. CSW spoke pt who confirmed he has been staying at [facility] but wants to go to [facility]. CSW called and left a message with [facility] to inquire about bed availability. TOC will follow up once a response is received.  1320: CSW called [facility] and is informed that they are not doing any more intakes until Monday 02/24/23. Pt has O[facility]'s phone number. CSW Spoke with pt who is agreeable to return to [facility]. He is not safe to go by bus. Taxi voucher provided for pt.      Barriers to Discharge: Continued Medical Work up   Patient Goals and CMS Choice CMS Medicare.gov Compare Post Acute Care list provided to:: Patient Choice offered to / list presented to : Patient  Discharge Placement                         Discharge Plan and Services Additional resources added to the After Visit Summary for   In-house Referral: Clinical Social Work Discharge Planning Services: NA Post Acute Care Choice: NA          DME Arranged: N/A DME Agency: NA                  Social Determinants of Health (SDOH) Interventions SDOH Screenings   Food Insecurity: No Food Insecurity (02/14/2023)  Recent Concern: Food Insecurity - Food Insecurity Present (01/17/2023)  Housing: Medium Risk (02/14/2023)  Transportation Needs: Unmet Transportation Needs (02/14/2023)  Utilities: Not At Risk (02/14/2023)  Alcohol Screen: Low Risk  (11/27/2022)  Depression (PHQ2-9): High Risk  (11/12/2021)  Financial Resource Strain: Medium Risk (09/16/2018)  Physical Activity: Unknown (04/22/2019)  Social Connections: Socially Isolated (09/16/2018)  Stress: Stress Concern Present (09/16/2018)  Tobacco Use: Low Risk  (02/15/2023)     Readmission Risk Interventions    02/17/2023    1:01 PM 07/05/2022   10:08 AM  Readmission Risk Prevention Plan  Transportation Screening Complete Complete  Medication Review Oceanographer) Complete Complete  PCP or Specialist appointment within 3-5 days of discharge Complete Complete  HRI or Home Care Consult Complete Complete  SW Recovery Care/Counseling Consult Complete Complete  Palliative Care Screening Not Applicable Not Applicable  Skilled Nursing Facility Not Applicable Not Applicable

## 2023-02-19 NOTE — Progress Notes (Signed)
02/19/2023  [name] DOB: 1951-09-24 MRN: [id number]   RIDER WAIVER AND RELEASE OF LIABILITY  For the purposes of helping with transportation needs, [facility] partners with outside transportation providers (taxi companies, [place], Catering [employer].) to give [employer] patients or other approved people the choice of on-demand rides [employer]") to our buildings for non-emergency visits.  By using [employer], I, the person signing this document, on behalf of myself and/or any legal minors (in my care using the [employer]), agree:  Science writer given to me are supplied by independent, outside transportation providers who do not work for, or have any affiliation with, [employer]. [facility] is not a transportation company. [facility] has no control over the quality or safety of the rides I get using [employer]. [facility] has no control over whether any outside ride will happen on time or not. [facility] gives no guarantee on the reliability, quality, safety, or availability on any rides, or that no mistakes will happen. I know and accept that traveling by vehicle (car, truck, [employer], Zenaida Niece, bus, taxi, etc.) has risks of serious injuries such as disability, being paralyzed, and death. I know and agree the risk of using [employer] is mine alone, and not [employer]. Transport Services are provided "as is" and as are available. The transportation providers are in charge for all inspections and care of the vehicles used to provide these rides. I agree not to take legal action against [facility], its agents, employees, officers, directors, representatives, insurers, attorneys, assigns, successors, subsidiaries, and affiliates at any time for any reasons related directly or indirectly to using [employer]. I also agree not to take legal action against [facility] or its affiliates for any injury, death, or damage to property caused by or related to  using [employer]. I have read this Waiver and Release of Liability, and I understand the terms used in it and their legal meaning. This Waiver is freely and voluntarily given with the understanding that my right (or any legal minors) to legal action against [facility] relating to [employer] is knowingly given up to use these services.   I attest that I read the Ride Waiver and Release of Liability to [name], gave Mr. Stoop the opportunity to ask questions and answered the questions asked (if any). I affirm that [name] then provided consent for assistance with transportation.

## 2023-02-19 NOTE — Progress Notes (Signed)
Mobility Specialist - Progress Note   02/19/23 1152  Mobility  Activity Ambulated with assistance to bathroom  Level of Assistance Standby assist, set-up cues, supervision of patient - no hands on  Assistive Device Front wheel walker  Distance Ambulated (ft) 25 ft  Range of Motion/Exercises Active  Activity Response Tolerated well  Mobility Referral Yes  $Mobility charge 1 Mobility  Mobility Specialist Start Time (ACUTE ONLY) 1148  Mobility Specialist Stop Time (ACUTE ONLY) 1152  Mobility Specialist Time Calculation (min) (ACUTE ONLY) 4 min   Pt caught getting up with chair alarm on, assisted pt to restroom and returned to chair with all needs met, alarm on.  Marilynne Halsted Mobility Specialist

## 2023-02-19 NOTE — Progress Notes (Signed)
SATURATION QUALIFICATIONS: (This note is used to comply with regulatory documentation for home oxygen)  Patient Saturations on Room Air at Rest = 99%  Patient Saturations on Room Air while Ambulating = 97%  Patient Saturations on  Liters of oxygen while Ambulating = %  Please briefly explain why patient needs home oxygen:Patient did not require supplemental oxygen. [facility] Kel[facility] PT Acute Rehabilitation Services Office [phone] Weekend [phone]

## 2023-02-19 NOTE — Discharge Summary (Signed)
Physician Discharge Summary  Michael [name] ZOX:[id number] DOB: 1951-05-27 DOA: 02/14/2023  PCP: [name], MD  Admit date: 02/14/2023 Discharge date: 02/19/2023  Admitted From: Shelter Disposition: Same  Recommendations for Outpatient Follow-up:  Follow up with PCP in 1-2 weeks  Home Health: None Equipment/Devices: None  Discharge Condition: Stable CODE STATUS: Full Diet recommendation: Low-salt low-fat diet low-carb diet  Brief/Interim Summary: 72 year old male with history of HIV, homelessness, ongoing cocaine abuse, PAF, CKD stage IV, CAD status post CABG, IDDM who comes into the hospital with community-acquired pneumonia as well as suicidal ideation. He is well-known to our system with frequent ED visits, this being his 11th visit in the past month. He presents currently with a cough for the last 2 weeks, cough being productive. He is nonadherent to any of his prescribed medications and continues to have a UDS positive for cocaine.   Patient admitted for presumed community-acquired pneumonia as below, completed antibiotic course and is otherwise back to baseline.  Continues to have cough with productive sputum but otherwise asymptomatic.  Patient's creatinine appears to be around baseline otherwise stable and agreeable for discharge.  Patient has been given 1 month supply of refills and educated at length about need for compliance.  Discharge Diagnoses:  Principal Problem:   CAP (community acquired pneumonia) Active Problems:   Suicidal ideations   Major depressive disorder, recurrent, severe without psychotic features ([name])   Cocaine use disorder, severe, dependence ([name])   Substance induced mood disorder ([name])   Type II diabetes mellitus with renal manifestations ([name])   AKI (acute kidney injury) ([name])  Community-acquired pneumonia, resolved Completed antibiotic course as above, currently on room air    AKI on CKD stage IV -essentially back to baseline, repeat  labs with PCP as scheduled   Anemia, multifactorial -likely in the setting of underlying chronic kidney disease, untreated HIV, questionable history of melena however status post transfusion patient's hemoglobin remained stable no further episodes of melena or bright red blood per rectum   Non-anion gap metabolic acidosis -due to renal failure.  Resolved   History of HIV -noncompliant to his home medications.  Discussed with ID over the weekend, patient is to resume home medications at discharge no changes   Thrombocytopenia -chronic, monitor.   CAD status post CABG -no chest pain.  Unfortunately he is not adhering to any of his home meds   Cocaine use disorder -counseled for cessation   Suicidal ideation -frequently presents with these, psychiatry consulted.  They recommend inpatient psychiatric admission initially, however on reevaluation he has been psychiatrically cleared, recommend ongoing outpatient follow-up   Essential hypertension -continue diltiazem, Imdur   PAF -continue diltiazem, not a good candidate for anticoagulation due to homelessness and nonadherence   Diabetes type 2 with renal manifestation -continue diabetic diet  Discharge Instructions  Allergies as of 02/19/2023   No Known Allergies      Medication List     STOP taking these medications    FLUoxetine 20 MG capsule Commonly known as: PROZAC   furosemide 20 MG tablet Commonly known as: LASIX   hydrALAZINE 25 MG tablet Commonly known as: APRESOLINE   pantoprazole 40 MG tablet Commonly known as: PROTONIX       TAKE these medications    albuterol 108 (90 Base) MCG/ACT inhaler Commonly known as: VENTOLIN HFA Inhale 1-2 puffs into the lungs every 6 (six) hours as needed for wheezing or shortness of breath.   aspirin 81 MG chewable tablet Chew 1 tablet (81  mg total) by mouth daily. What changed: how much to take   atorvastatin 40 MG tablet Commonly known as: LIPITOR Take 1 tablet (40 mg  total) by mouth daily.   b complex-vitamin c-folic acid 0.8 MG Tabs tablet Take 1 tablet by mouth daily.   diltiazem 180 MG 24 hr capsule Commonly known as: CARDIZEM CD Take 1 capsule (180 mg total) by mouth daily.   dolutegravir 50 MG tablet Commonly known as: TIVICAY Take 1 tablet (50 mg total) by mouth daily before lunch.   DULoxetine 30 MG capsule Commonly known as: CYMBALTA Take 1 capsule (30 mg total) by mouth daily.   ferrous sulfate 325 (65 FE) MG tablet Take 1 tablet (325 mg total) by mouth daily with breakfast.   fluticasone 50 MCG/ACT nasal spray Commonly known as: FLONASE Place 2 sprays into both nostrils daily.   fluticasone furoate-vilanterol 200-25 MCG/ACT Aepb Commonly known as: BREO ELLIPTA Inhale 1 puff into the lungs daily.   folic acid 1 MG tablet Commonly known as: FOLVITE Take 1 tablet (1 mg total) by mouth daily.   isosorbide mononitrate 30 MG 24 hr tablet Commonly known as: IMDUR Take 1 tablet (30 mg total) by mouth daily. What changed: how much to take   nitroGLYCERIN 0.4 MG SL tablet Commonly known as: NITROSTAT Place 1 tablet (0.4 mg total) under the tongue every 5 (five) minutes as needed for chest pain.   polyethylene glycol 17 g packet Commonly known as: MIRALAX / GLYCOLAX Take 17 g by mouth 2 (two) times daily.   QUEtiapine 50 MG tablet Commonly known as: SEROQUEL Take 1 tablet (50 mg total) by mouth at bedtime.   rilpivirine 25 MG Tabs tablet Commonly known as: EDURANT Take 1 tablet (25 mg total) by mouth daily before lunch.   sodium bicarbonate 650 MG tablet Take 1 tablet (650 mg total) by mouth 3 (three) times daily.        No Known Allergies  Consultations: None  Procedures/Studies: DG Chest 2 View  Result Date: 02/14/2023 CLINICAL DATA:  Cough EXAM: CHEST - 2 VIEW COMPARISON:  Chest x-ray dated Feb 10, 2023 FINDINGS: Cardiac and mediastinal contours are within normal limits status post median sternotomy. Left atrial  occlusion clip. New mild consolidation of the right lower lobe. No evidence of pleural effusion or pneumothorax. IMPRESSION: New mild consolidation of the right lower lobe, concerning for pneumonia. Electronically Signed   By: Allegra Lai M.D.   On: 02/14/2023 09:57   DG Chest 2 View  Result Date: 02/10/2023 CLINICAL DATA:  Chest pain.  Cough and shortness of breath. EXAM: CHEST - 2 VIEW COMPARISON:  Chest radiographs 02/02/2023 FINDINGS: Prior cardiac surgery is again noted. The cardiomediastinal silhouette is unchanged with normal heart size. No airspace consolidation, edema, pleural effusion, or pneumothorax is identified. Prior cervical spine fusion is noted. IMPRESSION: No active cardiopulmonary disease. Electronically Signed   By: Sebastian Ache M.D.   On: 02/10/2023 11:45   VAS [place] LOWER EXTREMITY VENOUS (DVT) (ONLY [facility] & [facility])  Result Date: 02/03/2023  Lower Venous DVT Study Patient Name:  IZEIAH SILVERSTONE  Date of Exam:   02/02/2023 Medical Rec #: [id number]            Accession #:    [id number] Date of Birth: 1951/04/03            Patient Gender: M Patient Age:   28 years Exam Location:  [facility] Procedure:      VAS [place]  LOWER EXTREMITY VENOUS (DVT) Referring Phys: BRITNI HENDERLY --------------------------------------------------------------------------------  Indications:  Risk Factors: Significant cardiac history i, non compliant with medication. Cocaine abuse.  Performing Technologist: Sherren Kerns RVS  Examination Guidelines: A complete evaluation includes B-mode imaging, spectral Doppler, color Doppler, and power Doppler as needed of all accessible portions of each vessel. Bilateral testing is considered an integral part of a complete examination. Limited examinations for reoccurring indications may be performed as noted. The reflux portion of the exam is performed with the patient in reverse Trendelenburg.  +---------+---------------+---------+-----------+----------+--------------+  RIGHT    CompressibilityPhasicitySpontaneityPropertiesThrombus Aging +---------+---------------+---------+-----------+----------+--------------+ CFV      Full           Yes      Yes                                 +---------+---------------+---------+-----------+----------+--------------+ SFJ      Full                                                        +---------+---------------+---------+-----------+----------+--------------+ FV Prox  Full                                                        +---------+---------------+---------+-----------+----------+--------------+ FV Mid   Full           Yes      Yes                                 +---------+---------------+---------+-----------+----------+--------------+ FV DistalFull                                                        +---------+---------------+---------+-----------+----------+--------------+ PFV      Full                                                        +---------+---------------+---------+-----------+----------+--------------+ POP      Full           Yes      Yes                                 +---------+---------------+---------+-----------+----------+--------------+ PTV      Full                                                        +---------+---------------+---------+-----------+----------+--------------+ PERO     Full                                                        +---------+---------------+---------+-----------+----------+--------------+   +---------+---------------+---------+-----------+----------+--------------+  LEFT     CompressibilityPhasicitySpontaneityPropertiesThrombus Aging +---------+---------------+---------+-----------+----------+--------------+ CFV      Full           Yes      Yes                                 +---------+---------------+---------+-----------+----------+--------------+ SFJ      Full                                                         +---------+---------------+---------+-----------+----------+--------------+ FV Prox  Full                                                        +---------+---------------+---------+-----------+----------+--------------+ FV Mid   Full           Yes      Yes                                 +---------+---------------+---------+-----------+----------+--------------+ FV DistalFull                                                        +---------+---------------+---------+-----------+----------+--------------+ PFV      Full                                                        +---------+---------------+---------+-----------+----------+--------------+ POP      Full           Yes      Yes                                 +---------+---------------+---------+-----------+----------+--------------+ PTV      Full                                                        +---------+---------------+---------+-----------+----------+--------------+ PERO     Full                                                        +---------+---------------+---------+-----------+----------+--------------+     Summary: BILATERAL: - No evidence of deep vein thrombosis seen in the lower extremities, bilaterally. -No evidence of popliteal cyst, bilaterally.   *See table(s) above for measurements and observations. Electronically signed by Coral Else MD on 02/03/2023 at 11:25:36 AM.    Final    DG  Chest 2 View  Result Date: 02/02/2023 CLINICAL DATA:  Chest pain EXAM: CHEST - 2 VIEW COMPARISON:  Chest x-ray 01/27/2023 FINDINGS: Patient is status post cardiac surgery. The heart size and mediastinal contours are within normal limits. Both lungs are clear. The visualized skeletal structures are unremarkable. IMPRESSION: No active cardiopulmonary disease. Electronically Signed   By: Darliss Cheney M.D.   On: 02/02/2023 18:09   CT ABDOMEN PELVIS WO CONTRAST  Result Date:  01/28/2023 CLINICAL DATA:  Acute abdominal pain, constipation, nausea EXAM: CT ABDOMEN AND PELVIS WITHOUT CONTRAST TECHNIQUE: Multidetector CT imaging of the abdomen and pelvis was performed following the standard protocol without IV contrast. Unenhanced CT was performed per clinician order. Lack of IV contrast limits sensitivity and specificity, especially for evaluation of abdominal/pelvic solid viscera. RADIATION DOSE REDUCTION: This exam was performed according to the departmental dose-optimization program which includes automated exposure control, adjustment of the mA and/or kV according to patient size and/or use of iterative reconstruction technique. COMPARISON:  01/17/2023 FINDINGS: Lower chest: No acute pleural or parenchymal lung disease. Stable bibasilar scarring. Hepatobiliary: Unremarkable unenhanced appearance of the liver and gallbladder. Pancreas: Unremarkable unenhanced appearance. Spleen: Unremarkable unenhanced appearance. Adrenals/Urinary Tract: No urinary tract calculi or obstructive uropathy. The adrenals and bladder are unremarkable. Stomach/Bowel: No bowel obstruction or ileus. Moderate stool throughout the colon, compatible with constipation. Large amount of retained stool within the rectal vault could reflect superimposed fecal impaction. No bowel wall thickening or inflammatory change. Vascular/Lymphatic: Aortic atherosclerosis. No enlarged abdominal or pelvic lymph nodes. Reproductive: Prostate is unremarkable. Other: No free fluid or free intraperitoneal gas. Small fat containing umbilical hernia. No bowel herniation. Musculoskeletal: No acute or destructive bony lesions. Postsurgical changes at the lumbosacral junction. Reconstructed images demonstrate no additional findings. IMPRESSION: 1. Constipation, with a large amount of retained stool in the rectal vault consistent with superimposed fecal impaction. 2.  Aortic Atherosclerosis (ICD10-I70.0). Electronically Signed   By: Sharlet Salina M.D.   On: 01/28/2023 22:56   DG Chest 2 View  Result Date: 01/27/2023 CLINICAL DATA:  Chest pain EXAM: CHEST - 2 VIEW COMPARISON:  None Available. FINDINGS: The heart size and mediastinal contours are within normal limits. Both lungs are clear. The visualized skeletal structures are unremarkable. Remote median sternotomy. IMPRESSION: No active cardiopulmonary disease. Electronically Signed   By: Deatra Robinson M.D.   On: 01/27/2023 02:43    Subjective: No acute issues or events  Discharge Exam: Vitals:   02/19/23 0522 02/19/23 0703  BP: (!) 184/99 (!) 155/83  Pulse: 71   Resp: 16 18  Temp: 98.3 F (36.8 C)   SpO2: 99%    Vitals:   02/18/23 1235 02/18/23 1907 02/19/23 0522 02/19/23 0703  BP: (!) 186/110 (!) 143/90 (!) 184/99 (!) 155/83  Pulse: 78 69 71   Resp: 17 18 16 18   Temp: 98.4 F (36.9 C) 97.7 F (36.5 C) 98.3 F (36.8 C)   TempSrc: Oral Oral Oral   SpO2: 100% 99% 99%   Weight:      Height:        General: Pt is alert, awake, not in acute distress Cardiovascular: RRR, S1/S2 +, no rubs, no gallops Respiratory: CTA bilaterally, no wheezing, no rhonchi Abdominal: Soft, NT, ND, bowel sounds + Extremities: no edema, no cyanosis    The results of significant diagnostics from this hospitalization (including imaging, microbiology, ancillary and laboratory) are listed below for reference.     Microbiology: Recent Results (from the past 240 hour(s))  Resp  panel by RT-PCR (RSV, Flu A&B, Covid) Anterior Nasal Swab     Status: None   Collection Time: 02/14/23  8:57 AM   Specimen: Anterior Nasal Swab  Result Value Ref Range Status   SARS Coronavirus 2 by RT PCR NEGATIVE NEGATIVE Final    Comment: (NOTE) SARS-CoV-2 target nucleic acids are NOT DETECTED.  The SARS-CoV-2 RNA is generally detectable in upper respiratory specimens during the acute phase of infection. The lowest concentration of SARS-CoV-2 viral copies this assay can detect is 138 copies/mL. A  negative result does not preclude SARS-Cov-2 infection and should not be used as the sole basis for treatment or other patient management decisions. A negative result may occur with  improper specimen collection/handling, submission of specimen other than nasopharyngeal swab, presence of viral mutation(s) within the areas targeted by this assay, and inadequate number of viral copies(<138 copies/mL). A negative result must be combined with clinical observations, patient history, and epidemiological information. The expected result is Negative.  Fact Sheet for Patients:  BloggerCourse.com  Fact Sheet for Healthcare Providers:  SeriousBroker.it  This test is no t yet approved or cleared by the [place] FDA and  has been authorized for detection and/or diagnosis of SARS-CoV-2 by FDA under an Emergency Use Authorization (EUA). This EUA will remain  in effect (meaning this test can be used) for the duration of the COVID-19 declaration under Section 564(b)(1) of the Act, 21 U.S.C.section 360bbb-3(b)(1), unless the authorization is terminated  or revoked sooner.       Influenza A by PCR NEGATIVE NEGATIVE Final   Influenza B by PCR NEGATIVE NEGATIVE Final    Comment: (NOTE) The Xpert Xpress SARS-CoV-2/FLU/RSV plus assay is intended as an aid in the diagnosis of influenza from Nasopharyngeal swab specimens and should not be used as a sole basis for treatment. Nasal washings and aspirates are unacceptable for Xpert Xpress SARS-CoV-2/FLU/RSV testing.  Fact Sheet for Patients: BloggerCourse.com  Fact Sheet for Healthcare Providers: SeriousBroker.it  This test is not yet approved or cleared by the [place] FDA and has been authorized for detection and/or diagnosis of SARS-CoV-2 by FDA under an Emergency Use Authorization (EUA). This EUA will remain in effect (meaning this test can  be used) for the duration of the COVID-19 declaration under Section 564(b)(1) of the Act, 21 U.S.C. section 360bbb-3(b)(1), unless the authorization is terminated or revoked.     Resp Syncytial Virus by PCR NEGATIVE NEGATIVE Final    Comment: (NOTE) Fact Sheet for Patients: BloggerCourse.com  Fact Sheet for Healthcare Providers: SeriousBroker.it  This test is not yet approved or cleared by the [place] FDA and has been authorized for detection and/or diagnosis of SARS-CoV-2 by FDA under an Emergency Use Authorization (EUA). This EUA will remain in effect (meaning this test can be used) for the duration of the COVID-19 declaration under Section 564(b)(1) of the Act, 21 U.S.C. section 360bbb-3(b)(1), unless the authorization is terminated or revoked.  Performed at [facility], [address] [place], [place] [address]      Labs: BNP (last 3 results) Recent Labs    07/02/22 0833 02/02/23 1610 02/14/23 0959  BNP 169.9* 368.1* 662.2*   Basic Metabolic Panel: Recent Labs  Lab 02/15/23 0532 02/16/23 0534 02/17/23 0500 02/18/23 0535 02/19/23 0525  NA 140 141 137 141 139  K 4.5 4.2 4.2 4.5 4.9  CL 116* 114* 109 112* 111  CO2 16* 17* 20* 21* 20*  GLUCOSE 122* 104* 112* 116* 83  BUN  78* 67* 59* 55* 47*  CREATININE 4.04* 3.95* 3.82* 3.89* 3.76*  CALCIUM 7.5* 7.7* 7.7* 7.6* 7.9*  MG  --  1.4* 1.7  --  1.6*   Liver Function Tests: Recent Labs  Lab 02/14/23 0959 02/16/23 0534 02/19/23 0525  AST 66* 18 15  ALT 115* 61* 37  ALKPHOS 101 70 71  BILITOT 0.3 0.5 0.5  PROT 6.8 5.8* 6.5  ALBUMIN 3.2* 2.5* 2.7*   CBC: Recent Labs  Lab 02/14/23 0959 02/15/23 0532 02/16/23 0534 02/17/23 0500 02/18/23 0535 02/19/23 0525  WBC 7.0 5.4 5.0 5.8 5.4 6.0  NEUTROABS 5.5  --   --   --   --   --   HGB 7.9* 6.1* 6.9* 8.4* 8.1* 8.6*  HCT 25.6* 20.0* 22.1* 25.8* 25.7* 27.0*  MCV 92.1 93.9 92.5 89.6  91.1 91.2  PLT 114* 105* 108* 110* 127* 140*   CBG: Recent Labs  Lab 02/17/23 2023 02/18/23 0724 02/18/23 1147 02/18/23 1641 02/18/23 2045  GLUCAP 147* 96 147* 140* 97    Urinalysis    Component Value Date/Time   COLORURINE STRAW (A) 02/14/2023 0902   APPEARANCEUR CLEAR 02/14/2023 0902   LABSPEC 1.013 02/14/2023 0902   PHURINE 5.0 02/14/2023 0902   GLUCOSEU NEGATIVE 02/14/2023 0902   HGBUR SMALL (A) 02/14/2023 0902   BILIRUBINUR NEGATIVE 02/14/2023 0902   KETONESUR NEGATIVE 02/14/2023 0902   PROTEINUR 100 (A) 02/14/2023 0902   UROBILINOGEN 0.2 08/13/2015 1030   NITRITE NEGATIVE 02/14/2023 0902   LEUKOCYTESUR NEGATIVE 02/14/2023 0902   Sepsis Labs Recent Labs  Lab 02/16/23 0534 02/17/23 0500 02/18/23 0535 02/19/23 0525  WBC 5.0 5.8 5.4 6.0   Microbiology Recent Results (from the past 240 hour(s))  Resp panel by RT-PCR (RSV, Flu A&B, Covid) Anterior Nasal Swab     Status: None   Collection Time: 02/14/23  8:57 AM   Specimen: Anterior Nasal Swab  Result Value Ref Range Status   SARS Coronavirus 2 by RT PCR NEGATIVE NEGATIVE Final    Comment: (NOTE) SARS-CoV-2 target nucleic acids are NOT DETECTED.  The SARS-CoV-2 RNA is generally detectable in upper respiratory specimens during the acute phase of infection. The lowest concentration of SARS-CoV-2 viral copies this assay can detect is 138 copies/mL. A negative result does not preclude SARS-Cov-2 infection and should not be used as the sole basis for treatment or other patient management decisions. A negative result may occur with  improper specimen collection/handling, submission of specimen other than nasopharyngeal swab, presence of viral mutation(s) within the areas targeted by this assay, and inadequate number of viral copies(<138 copies/mL). A negative result must be combined with clinical observations, patient history, and epidemiological information. The expected result is Negative.  Fact Sheet for  Patients:  BloggerCourse.com  Fact Sheet for Healthcare Providers:  SeriousBroker.it  This test is no t yet approved or cleared by the [place] FDA and  has been authorized for detection and/or diagnosis of SARS-CoV-2 by FDA under an Emergency Use Authorization (EUA). This EUA will remain  in effect (meaning this test can be used) for the duration of the COVID-19 declaration under Section 564(b)(1) of the Act, 21 U.S.C.section 360bbb-3(b)(1), unless the authorization is terminated  or revoked sooner.       Influenza A by PCR NEGATIVE NEGATIVE Final   Influenza B by PCR NEGATIVE NEGATIVE Final    Comment: (NOTE) The Xpert Xpress SARS-CoV-2/FLU/RSV plus assay is intended as an aid in the diagnosis of influenza from Nasopharyngeal swab specimens and  should not be used as a sole basis for treatment. Nasal washings and aspirates are unacceptable for Xpert Xpress SARS-CoV-2/FLU/RSV testing.  Fact Sheet for Patients: BloggerCourse.com  Fact Sheet for Healthcare Providers: SeriousBroker.it  This test is not yet approved or cleared by the [place] FDA and has been authorized for detection and/or diagnosis of SARS-CoV-2 by FDA under an Emergency Use Authorization (EUA). This EUA will remain in effect (meaning this test can be used) for the duration of the COVID-19 declaration under Section 564(b)(1) of the Act, 21 U.S.C. section 360bbb-3(b)(1), unless the authorization is terminated or revoked.     Resp Syncytial Virus by PCR NEGATIVE NEGATIVE Final    Comment: (NOTE) Fact Sheet for Patients: BloggerCourse.com  Fact Sheet for Healthcare Providers: SeriousBroker.it  This test is not yet approved or cleared by the [place] FDA and has been authorized for detection and/or diagnosis of SARS-CoV-2 by FDA under an Emergency  Use Authorization (EUA). This EUA will remain in effect (meaning this test can be used) for the duration of the COVID-19 declaration under Section 564(b)(1) of the Act, 21 U.S.C. section 360bbb-3(b)(1), unless the authorization is terminated or revoked.  Performed at [facility], [address], [place], [place] [address]     Time coordinating discharge: Over 30 minutes  SIGNED:  Azucena Fallen, DO [facility] 02/19/2023, 7:16 AM Pager   If 7PM-7AM, please contact night-coverage [email or web address]

## 2023-02-19 NOTE — Plan of Care (Signed)

## 2023-02-19 NOTE — Progress Notes (Signed)
Pt educated on DC instructions. Printed instructions given to pt. Medications delivered to pt at bedside by pharmacist. Pt verbalized understanding of all. LUE and RUE IV's removed. PT transported off floor to hospital main entrance by NT. Cab voucher in pt's hand.

## 2023-02-19 NOTE — Care Management Important Message (Signed)
Important Message  Patient Details IM Letter given. Name: Michael [name] MRN: [id number] Date of Birth: 06/06/1951   Medicare Important Message Given:  Yes     Caren Macadam 02/19/2023, 11:52 AM

## 2023-02-24 NOTE — Progress Notes (Signed)
The patient attended 02/04/23 screening event where his screening results were wnl.  At the event the patient noted he had transportation, housing and safety SDOH insecurities. The Pt noted Dr Lawerance Bach as his PCP. Chart review does not show a past visit with pcp in the last twelve months.   Per chart review the Pt was given a taxi voucher for transportation insecurities. Per chart review safety and food SDOH has been updated to not at risk as of 02/14/23. Chart review also indicates a future appt on 02/27/23 with Lynn Ito. The Pt got help by [facility] Social Services on 02/20/23 (including behavioral health support).   The Pt does not have a phone number in the system and did not write one on the screening form. I will mail the Pt a normal results letter and provide my phone number. I will also mail out additional resources that may be helpful regarding his SDOH needs and a get care now flyer. A sixty day follow up is needed.  Michael [name] Care Guide,

## 2023-02-27 ENCOUNTER — Inpatient Hospital Stay: Payer: 59 | Admitting: Infectious Diseases

## 2023-03-05 ENCOUNTER — Emergency Department (HOSPITAL_COMMUNITY): Payer: 59

## 2023-03-05 ENCOUNTER — Emergency Department (HOSPITAL_COMMUNITY)
Admission: EM | Admit: 2023-03-05 | Discharge: 2023-03-06 | Disposition: A | Discharge status: Home / Self Care | Payer: 59 | Source: Home / Self Care | Attending: Emergency Medicine | Admitting: Emergency Medicine

## 2023-03-05 ENCOUNTER — Other Ambulatory Visit: Payer: Self-pay

## 2023-03-05 DIAGNOSIS — Z7982 Long term (current) use of aspirin: Secondary | ICD-10-CM | POA: Insufficient documentation

## 2023-03-05 DIAGNOSIS — N189 Chronic kidney disease, unspecified: Secondary | ICD-10-CM

## 2023-03-05 DIAGNOSIS — R45851 Suicidal ideations: Secondary | ICD-10-CM | POA: Insufficient documentation

## 2023-03-05 DIAGNOSIS — F332 Major depressive disorder, recurrent severe without psychotic features: Secondary | ICD-10-CM | POA: Insufficient documentation

## 2023-03-05 DIAGNOSIS — E1122 Type 2 diabetes mellitus with diabetic chronic kidney disease: Secondary | ICD-10-CM | POA: Insufficient documentation

## 2023-03-05 DIAGNOSIS — I10 Essential (primary) hypertension: Secondary | ICD-10-CM

## 2023-03-05 DIAGNOSIS — Z79899 Other long term (current) drug therapy: Secondary | ICD-10-CM | POA: Insufficient documentation

## 2023-03-05 DIAGNOSIS — R7989 Other specified abnormal findings of blood chemistry: Secondary | ICD-10-CM | POA: Insufficient documentation

## 2023-03-05 DIAGNOSIS — N184 Chronic kidney disease, stage 4 (severe): Secondary | ICD-10-CM | POA: Insufficient documentation

## 2023-03-05 DIAGNOSIS — R600 Localized edema: Secondary | ICD-10-CM | POA: Insufficient documentation

## 2023-03-05 DIAGNOSIS — I130 Hypertensive heart and chronic kidney disease with heart failure and stage 1 through stage 4 chronic kidney disease, or unspecified chronic kidney disease: Secondary | ICD-10-CM | POA: Insufficient documentation

## 2023-03-05 DIAGNOSIS — M7989 Other specified soft tissue disorders: Secondary | ICD-10-CM

## 2023-03-05 DIAGNOSIS — I509 Heart failure, unspecified: Secondary | ICD-10-CM | POA: Insufficient documentation

## 2023-03-05 DIAGNOSIS — Z5900 Homelessness unspecified: Secondary | ICD-10-CM | POA: Insufficient documentation

## 2023-03-05 DIAGNOSIS — E872 Acidosis, unspecified: Secondary | ICD-10-CM | POA: Diagnosis not present

## 2023-03-05 DIAGNOSIS — T405X1A Poisoning by cocaine, accidental (unintentional), initial encounter: Secondary | ICD-10-CM | POA: Diagnosis not present

## 2023-03-05 LAB — URINALYSIS, ROUTINE W REFLEX MICROSCOPIC
Bacteria, UA: NONE SEEN
Bilirubin Urine: NEGATIVE
Glucose, UA: 50 mg/dL — AB
Ketones, ur: NEGATIVE mg/dL
Leukocytes,Ua: NEGATIVE
Nitrite: NEGATIVE
Protein, ur: >=300 mg/dL — AB
Specific Gravity, Urine: 1.015 (ref 1.005–1.030)
pH: 6 (ref 5.0–8.0)

## 2023-03-05 LAB — CBC WITH DIFFERENTIAL/PLATELET
Abs Immature Granulocytes: 0.04 10*3/uL (ref 0.00–0.07)
Basophils Absolute: 0 10*3/uL (ref 0.0–0.1)
Basophils Relative: 1 %
Eosinophils Absolute: 0.2 10*3/uL (ref 0.0–0.5)
Eosinophils Relative: 3 %
HCT: 29.5 % — ABNORMAL LOW (ref 39.0–52.0)
Hemoglobin: 9.3 g/dL — ABNORMAL LOW (ref 13.0–17.0)
Immature Granulocytes: 1 %
Lymphocytes Relative: 18 %
Lymphs Abs: 0.9 10*3/uL (ref 0.7–4.0)
MCH: 28.2 pg (ref 26.0–34.0)
MCHC: 31.5 g/dL (ref 30.0–36.0)
MCV: 89.4 fL (ref 80.0–100.0)
Monocytes Absolute: 0.4 10*3/uL (ref 0.1–1.0)
Monocytes Relative: 7 %
Neutro Abs: 3.7 10*3/uL (ref 1.7–7.7)
Neutrophils Relative %: 70 %
Platelets: 139 10*3/uL — ABNORMAL LOW (ref 150–400)
RBC: 3.3 MIL/uL — ABNORMAL LOW (ref 4.22–5.81)
RDW: 14.1 % (ref 11.5–15.5)
WBC: 5.2 10*3/uL (ref 4.0–10.5)
nRBC: 0 % (ref 0.0–0.2)

## 2023-03-05 LAB — COMPREHENSIVE METABOLIC PANEL
ALT: 61 U/L — ABNORMAL HIGH (ref 0–44)
AST: 26 U/L (ref 15–41)
Albumin: 3.2 g/dL — ABNORMAL LOW (ref 3.5–5.0)
Alkaline Phosphatase: 92 U/L (ref 38–126)
Anion gap: 8 (ref 5–15)
BUN: 60 mg/dL — ABNORMAL HIGH (ref 8–23)
CO2: 21 mmol/L — ABNORMAL LOW (ref 22–32)
Calcium: 7.6 mg/dL — ABNORMAL LOW (ref 8.9–10.3)
Chloride: 111 mmol/L (ref 98–111)
Creatinine, Ser: 3.62 mg/dL — ABNORMAL HIGH (ref 0.61–1.24)
GFR, Estimated: 17 mL/min — ABNORMAL LOW (ref >60)
Glucose, Bld: 165 mg/dL — ABNORMAL HIGH (ref 70–99)
Potassium: 4.4 mmol/L (ref 3.5–5.1)
Sodium: 140 mmol/L (ref 135–145)
Total Bilirubin: 0.7 mg/dL (ref 0.3–1.2)
Total Protein: 7 g/dL (ref 6.5–8.1)

## 2023-03-05 LAB — BRAIN NATRIURETIC PEPTIDE: B Natriuretic Peptide: 1280.1 pg/mL — ABNORMAL HIGH (ref 0.0–100.0)

## 2023-03-05 LAB — SALICYLATE LEVEL: Salicylate Lvl: <7 mg/dL — ABNORMAL LOW (ref 7.0–30.0)

## 2023-03-05 LAB — ETHANOL: Alcohol, Ethyl (B): <10 mg/dL (ref <10)

## 2023-03-05 LAB — TROPONIN I (HIGH SENSITIVITY): Troponin I (High Sensitivity): 32 ng/L — ABNORMAL HIGH (ref <18)

## 2023-03-05 LAB — ACETAMINOPHEN LEVEL: Acetaminophen (Tylenol), Serum: <10 ug/mL — ABNORMAL LOW (ref 10–30)

## 2023-03-05 MED ORDER — DULOXETINE HCL 30 MG PO CPEP
30.0000 mg | ORAL_CAPSULE | Freq: Every day | ORAL | Status: DC
  Administered 2023-03-05 – 2023-03-06 (×2): 30 mg via ORAL
  Filled 2023-03-05 (×2): qty 1

## 2023-03-05 MED ORDER — FUROSEMIDE 40 MG PO TABS
40.0000 mg | ORAL_TABLET | Freq: Every day | ORAL | Status: DC
  Administered 2023-03-05 – 2023-03-06 (×2): 40 mg via ORAL
  Filled 2023-03-05 (×2): qty 1

## 2023-03-05 MED ORDER — ATORVASTATIN CALCIUM 40 MG PO TABS
40.0000 mg | ORAL_TABLET | Freq: Every day | ORAL | Status: DC
  Administered 2023-03-05 – 2023-03-06 (×2): 40 mg via ORAL
  Filled 2023-03-05 (×2): qty 1

## 2023-03-05 MED ORDER — QUETIAPINE FUMARATE 50 MG PO TABS
50.0000 mg | ORAL_TABLET | Freq: Every day | ORAL | Status: DC
  Administered 2023-03-05: 50 mg via ORAL
  Filled 2023-03-05: qty 1

## 2023-03-05 MED ORDER — DILTIAZEM HCL ER COATED BEADS 180 MG PO CP24
180.0000 mg | ORAL_CAPSULE | Freq: Every day | ORAL | Status: DC
  Administered 2023-03-05 – 2023-03-06 (×2): 180 mg via ORAL
  Filled 2023-03-05 (×2): qty 1

## 2023-03-05 MED ORDER — ISOSORBIDE MONONITRATE ER 30 MG PO TB24
30.0000 mg | ORAL_TABLET | Freq: Every day | ORAL | Status: DC
  Administered 2023-03-05 – 2023-03-06 (×2): 30 mg via ORAL
  Filled 2023-03-05 (×2): qty 1

## 2023-03-05 NOTE — ED Triage Notes (Signed)
Pt arrives via [facility] c/o generalized pain x1 year, worse today. Also c/o cough that has been ongoing for some time.  When asked CSSRS screening questions, pt states that he "thinks about it from time to time". Denies plan or SI at this time.  EMS vitals: 180/100; hx of HTN HR 86 SpO2 100% on room air CBG 247; hx of diabetes

## 2023-03-05 NOTE — ED Provider Notes (Signed)
[place] EMERGENCY DEPARTMENT AT [facility] Provider Note   CSN: [id number] Arrival date & time: 03/05/23  1911     History  Chief Complaint  Patient presents with   Pain   Cough    Michael [name] is a 72 y.o. male history of heart failure, homelessness, depression here presenting with multiple symptoms.  Patient was recently admitted for pneumonia and finished a course of antibiotics.  Patient has unstable living situation.  He is currently at the [facility].  He is complaining of some bilateral leg swelling and diffuse pain.  Patient states that he has some chest pain and cough.  Patient also is suicidal.  When I asked him about his feelings he states that he just very overwhelmed.  He did not specify a particular plan.   The history is provided by the patient.       Home Medications Prior to Admission medications   Medication Sig Start Date End Date Taking? Authorizing Provider  albuterol (VENTOLIN HFA) 108 (90 Base) MCG/ACT inhaler Inhale 1-2 puffs into the lungs every 6 (six) hours as needed for wheezing or shortness of breath. 02/19/23   Azucena Fallen, MD  aspirin 81 MG chewable tablet Chew 1 tablet (81 mg total) by mouth daily. 02/19/23   Azucena Fallen, MD  atorvastatin (LIPITOR) 40 MG tablet Take 1 tablet (40 mg total) by mouth daily. 02/19/23 06/19/23  Azucena Fallen, MD  b complex-vitamin c-folic acid (NEPHRO-VITE) 0.8 MG TABS tablet Take 1 tablet by mouth daily. Patient not taking: Reported on 01/27/2023    [provider]  diltiazem (CARDIZEM CD) 180 MG 24 hr capsule Take 1 capsule (180 mg total) by mouth daily. 02/19/23 03/21/23  Azucena Fallen, MD  dolutegravir (TIVICAY) 50 MG tablet Take 1 tablet (50 mg total) by mouth daily before lunch. 02/19/23   Azucena Fallen, MD  DULoxetine (CYMBALTA) 30 MG capsule Take 1 capsule (30 mg total) by mouth daily. 02/19/23   Azucena Fallen, MD  ferrous sulfate 325 (65 FE) MG tablet  Take 1 tablet (325 mg total) by mouth daily with breakfast. Patient not taking: Reported on 01/27/2023 12/17/22   Regalado, Jon Billings A, MD  fluticasone (FLONASE) 50 MCG/ACT nasal spray Place 2 sprays into both nostrils daily. 02/19/23   Azucena Fallen, MD  fluticasone furoate-vilanterol (BREO ELLIPTA) 200-25 MCG/ACT AEPB Inhale 1 puff into the lungs daily. 02/19/23   Azucena Fallen, MD  folic acid (FOLVITE) 1 MG tablet Take 1 tablet (1 mg total) by mouth daily. Patient not taking: Reported on 01/27/2023 12/17/22   Hartley Barefoot A, MD  isosorbide mononitrate (IMDUR) 30 MG 24 hr tablet Take 1 tablet (30 mg total) by mouth daily. 02/19/23   Azucena Fallen, MD  nitroGLYCERIN (NITROSTAT) 0.4 MG SL tablet Place 1 tablet (0.4 mg total) under the tongue every 5 (five) minutes as needed for chest pain. 02/19/23   Azucena Fallen, MD  polyethylene glycol (MIRALAX / GLYCOLAX) 17 g packet Take 17 g by mouth 2 (two) times daily. Patient not taking: Reported on 01/27/2023 12/17/22   Regalado, Jon Billings A, MD  QUEtiapine (SEROQUEL) 50 MG tablet Take 1 tablet (50 mg total) by mouth at bedtime. 02/19/23   Azucena Fallen, MD  rilpivirine (EDURANT) 25 MG TABS tablet Take 1 tablet (25 mg total) by mouth daily before lunch. 02/19/23   Azucena Fallen, MD  sodium bicarbonate 650 MG tablet Take 1 tablet (650 mg total)  by mouth 3 (three) times daily. 02/19/23 03/21/23  Azucena Fallen, MD      Allergies    Patient has no known allergies.    Review of Systems   Review of Systems  Respiratory:  Positive for cough.   Psychiatric/Behavioral:  Positive for suicidal ideas.   All other systems reviewed and are negative.   Physical Exam Updated Vital Signs BP (!) 187/98   Pulse 85   Temp 99.1 F (37.3 C) (Oral)   Resp 18   Ht 5\' 8"  (1.727 m)   Wt 74.4 kg   SpO2 100%   BMI 24.94 kg/m  Physical Exam Vitals and nursing note reviewed.  Constitutional:      Comments: Chronically  ill-appearing  HENT:     Head: Normocephalic.     Nose: Nose normal.     Mouth/Throat:     Mouth: Mucous membranes are moist.  Eyes:     Extraocular Movements: Extraocular movements intact.     Pupils: Pupils are equal, round, and reactive to light.  Cardiovascular:     Rate and Rhythm: Normal rate and regular rhythm.     Pulses: Normal pulses.     Heart sounds: Normal heart sounds.  Pulmonary:     Comments: Crackles bilateral bases.  No wheezing Abdominal:     General: Abdomen is flat.     Palpations: Abdomen is soft.  Musculoskeletal:     Cervical back: Normal range of motion and neck supple.     Comments: 1 + edema bilateral legs   Skin:    Capillary Refill: Capillary refill takes less than 2 seconds.  Neurological:     General: No focal deficit present.     Mental Status: He is alert and oriented to person, place, and time.  Psychiatric:        Mood and Affect: Mood normal.        Behavior: Behavior normal.     ED Results / Procedures / Treatments   Labs (all labs ordered are listed, but only abnormal results are displayed) Labs Reviewed  CBC WITH DIFFERENTIAL/PLATELET  COMPREHENSIVE METABOLIC PANEL  ETHANOL  BRAIN NATRIURETIC PEPTIDE  SALICYLATE LEVEL  ACETAMINOPHEN LEVEL  URINALYSIS, ROUTINE W REFLEX MICROSCOPIC  TROPONIN I (HIGH SENSITIVITY)    EKG None  Radiology No results found.  Procedures Procedures    Medications Ordered in ED Medications - No data to display  ED Course/ Medical Decision Making/ A&P                             Medical Decision Making Michael [name] is a 72 y.o. male here presenting with chest pain and cough and leg swelling and suicidal ideation.  Patient has constellation of complaints.  He has unstable social situation I think that the main issue.  Patient does live at [facility].  He has been evaluated previously for similar symptoms.  Plan to get labs and consult TTS.  9:47 PM I reviewed patient's labs and creatinine  is baseline at 3.6.  Patient unfortunate has elevated BNP of 1280.  Troponin is stable at 30.  Chest x-ray did not show any pulmonary edema.  Patient is having vague suicidal ideation still.  Consulted TTS.  He also has uncontrolled blood pressure and will start patient on his BP meds.  For his BNP of 1280 with his leg swelling, I started patient on Lasix 40 mg daily for 5 days to help  with diuresis.  I do not think he meets criteria for medical admission at this point.   Problems Addressed: Chronic kidney disease, unspecified CKD stage: chronic illness or injury Hypertension, unspecified type: chronic illness or injury Leg swelling: chronic illness or injury Suicidal ideation: chronic illness or injury  Amount and/or Complexity of Data Reviewed Labs: ordered. Decision-making details documented in ED Course. Radiology: ordered and independent interpretation performed. Decision-making details documented in ED Course. ECG/medicine tests: ordered and independent interpretation performed. Decision-making details documented in ED Course.  Risk Prescription drug management.    Final Clinical Impression(s) / ED Diagnoses Final diagnoses:  None    Rx / DC Orders ED Discharge Orders     None         Charlynne Pander, MD 03/05/23 2150    Charlynne Pander, MD 03/06/23 0030

## 2023-03-05 NOTE — BH Assessment (Signed)
Comprehensive Clinical Assessment (CCA) Note  03/05/2023 Michael [name] [id number]  Disposition: Clinical report given to Michael Guadeloupe, NP who states patient is psychiatrically cleared, with a recommendation for outpatient follow-up. Patient has been provided resources for housing, as well as substance abuse treatment during each ED visit. Patient currently residing at the [facility], who can also be utilized as a Theatre stage manager. RN Alonna Buckler and Dr. Silverio Lay notified of the recommendation.  The patient demonstrates the following risk factors for suicide: Chronic risk factors for suicide include: psychiatric disorder of MDD and substance use disorder. Acute risk factors for suicide include: social withdrawal/isolation. Protective factors for this patient include: hope for the future. Considering these factors, the overall suicide risk at this point appears to be low. Patient is appropriate for outpatient follow up.  Michael [name]. [place] is a 72 year-old widowed male who presents to [facility] ED via EMS. Patient reports a history of depression. Patient states he contacted EMS due to chest pain and being suicidal with no plan. Patient states he has been experiencing SI on and off for the past four to five months. Patient reports he engaged in self-harm by cutting his wrist last year. Patient is currently homeless and has been sleeping at the [facility], since being discharged from the hospital on 02/19/2023. Patient was admitted due to pneumonia. Patient identifies his numerous health issues and constant pain as stressors. Patient denies current HI, auditory or visual hallucinations. Patient denies current substance use.  Patient denies having any supports and states he is not connected to any mental health services. Patient has numerous ED visits related to both his health and mental health.   Patient presents dressed in scrubs, alert and oriented x4 with normal speech. Patient makes good eye contact and he has a flat  affect. There is no indication he is responding to internal stimuli.  Patient is cooperative throughout the assessment.    Chief Complaint:  Chief Complaint  Patient presents with   Pain   Cough   Suicidal   Visit Diagnosis: Major depressive disorder, recurrent, severe without psychotic features Suicidal ideation    CCA Screening, Triage and Referral (STR)  Patient Reported Information How did you hear about [place]? Other (Comment) (EMS)  What Is the Reason for Your Visit/Call Today? Michael [name] is a 72 year old widowed male who presents to ED with physical complaints, as well as suicidal ideations with no plan. Patient denies current HI, auditory or visual hallucinations.  How Long Has This Been Causing You Problems? 1-6 months  What Do You Feel Would Help You the Most Today? Treatment for Depression or other mood problem   Have You Recently Had Any Thoughts About Hurting Yourself? Yes  Are You Planning to Commit Suicide/Harm Yourself At This time? No   Flowsheet Row ED from 03/05/2023 in [facility] Emergency Department at [facility] ED to Hosp-Admission (Discharged) from 02/14/2023 in [facility] ED from 02/10/2023 in [facility] Emergency Department at [facility]  C-SSRS RISK CATEGORY Low Risk No Risk High Risk       Have you Recently Had Thoughts About Hurting Someone Michael [name]? No  Are You Planning to Harm Someone at This Time? No  Explanation: N/A   Have You Used Any Alcohol or Drugs in the Past 24 Hours? No  What Did You Use and How Much? N/A   Do You Currently Have a Therapist/Psychiatrist? No  Name of Therapist/Psychiatrist: Name of Therapist/Psychiatrist: N/A  Have You Been Recently Discharged From Any Office Practice or Programs? Yes  Explanation of Discharge From Practice/Program: Recently discharged from hospital due to pneumonia.     CCA Screening Triage Referral Assessment Type of Contact:  Tele-Assessment  Telemedicine Service Delivery: Telemedicine service delivery: This service was provided via telemedicine using a 2-way, interactive audio and video technology  Is this Initial or Reassessment? Is this Initial or Reassessment?: Initial Assessment  Date Telepsych consult ordered in [facility]:  Date Telepsych consult ordered in [facility]: 03/05/23  Time Telepsych consult ordered in [facility]:  Time Telepsych consult ordered in [facility]: 1959  Location of Assessment: [facility] ED  Provider Location: [facility] Assessment Services   Collateral Involvement: None   Does Patient Have a Automotive engineer Guardian? No  Legal Guardian Contact Information: N/A  Copy of Legal Guardianship Form: -- (N/A)  Legal Guardian Notified of Arrival: -- (N/A)  Legal Guardian Notified of Pending Discharge: -- (N/A)  If Minor and Not Living with Parent(s), Who has Custody? N/A  Is CPS involved or ever been involved? Never  Is APS involved or ever been involved? Never   Patient Determined To Be At Risk for Harm To Self or Others Based on Review of Patient Reported Information or Presenting Complaint? Yes, for Self-Harm (denies HI)  Method: No Plan (denies HI)  Availability of Means: No access or NA (denies HI)  Intent: Vague intent or NA (denies HI.)  Notification Required: No need or identified person (denies HI)  Additional Information for Danger to Others Potential: -- (N/A)  Additional Comments for Danger to Others Potential: N/A  Are There Guns or Other Weapons in Your Home? No  Types of Guns/Weapons: N/A  Are These Weapons Safely Secured?                            -- (N/A)  Who Could Verify You Are Able To Have These Secured: N/A  Do You Have any Outstanding Charges, Pending Court Dates, Parole/Probation? Patient denies  Contacted To Inform of Risk of Harm To Self or Others: -- (N/A)    Does Patient Present under Involuntary Commitment? No    [place] of Residence:  [place]   Patient Currently Receiving the Following Services: Not Receiving Services   Determination of Need: Urgent (48 hours)   Options For Referral: Inpatient Hospitalization     CCA Biopsychosocial Patient Reported Schizophrenia/Schizoaffective Diagnosis in Past: No   Strengths: Patient has insight to mental health   Mental Health Symptoms Depression:   Hopelessness; Fatigue; Sleep (too much or little)   Duration of Depressive symptoms:  Duration of Depressive Symptoms: Greater than two weeks   Mania:   None   Anxiety:    None   Psychosis:   None   Duration of Psychotic symptoms:    Trauma:   None   Obsessions:   None   Compulsions:   None   Inattention:   None   Hyperactivity/Impulsivity:   None   Oppositional/Defiant Behaviors:   None   Emotional Irregularity:   None   Other Mood/Personality Symptoms:   N/A    Mental Status Exam Appearance and self-care  Stature:   Average   Weight:   Thin   Clothing:   -- (Scrubs)   Grooming:   Normal   Cosmetic use:   None   Posture/gait:   Normal   Motor activity:   Not Remarkable   Sensorium  Attention:  Normal   Concentration:   Normal   Orientation:   X5   Recall/memory:   Normal   Affect and Mood  Affect:   Appropriate   Mood:   Depressed   Relating  Eye contact:   Normal   Facial expression:   Responsive   Attitude toward examiner:   Cooperative   Thought and Language  Speech flow:  Normal   Thought content:   Appropriate to Mood and Circumstances   Preoccupation:   None   Hallucinations:   None   Organization:   Coherent   [employer] of Knowledge:   Average   Intelligence:   Average   Abstraction:   Normal   Judgement:   Normal   Reality Testing:   Adequate   Insight:   Good   Decision Making:   Normal   Social Functioning  Social Maturity:   Isolates   Social Judgement:   "Street Smart"    Stress  Stressors:   Housing; Other (Comment) (Physical health)   Coping Ability:   Overwhelmed   Skill Deficits:   None   Supports:   Support needed     Religion: Religion/Spirituality Are You A Religious Person?: Yes What is Your Religious Affiliation?: Methodist How Might This Affect Treatment?: N/A  Leisure/Recreation: Leisure / Recreation Do You Have Hobbies?: Yes Leisure and Hobbies: Fishing  Exercise/Diet: Exercise/Diet Do You Exercise?: No Have You Gained or Lost A Significant Amount of Weight in the Past Six Months?: No Do You Follow a Special Diet?: No Do You Have Any Trouble Sleeping?: Yes Explanation of Sleeping Difficulties: Patient reports poor sleep.   CCA Employment/Education Employment/Work Situation: Employment / Work Systems developer: On disability Why is Patient on Disability: Due to physical health How Long has Patient Been on Disability: 20 years Patient's Job has Been Impacted by Current Illness: No Has Patient ever Been in the [employer]?: No  Education: Education Is Patient Currently Attending School?: No Last Grade Completed: 12 Did You Attend College?: No Did You Have An Individualized Education Program (IIEP): No Did You Have Any Difficulty At School?: No Patient's Education Has Been Impacted by Current Illness: No   CCA Family/Childhood History Family and Relationship History: Family history Marital status: Widowed Widowed, when?: 2 years ago Does patient have children?: Yes How many children?: 2 How is patient's relationship with their children?: Patient reports he does not talk to his children  Childhood History:  Childhood History By whom was/is the patient raised?: Both parents Did patient suffer any verbal/emotional/physical/sexual abuse as a child?: No Did patient suffer from severe childhood neglect?: No Has patient ever been sexually abused/assaulted/raped as an adolescent or adult?: No Was the  patient ever a victim of a crime or a disaster?: No Witnessed domestic violence?: No Has patient been affected by domestic violence as an adult?: No       CCA Substance Use Alcohol/Drug Use: Alcohol / Drug Use Pain Medications: See MAR Prescriptions: See MAR Over the Counter: See MAR History of alcohol / drug use?: No history of alcohol / drug abuse Longest period of sobriety (when/how long): Currently sober about 1 month, per patient Negative Consequences of Use:  (N/A) Withdrawal Symptoms:  (N/A)                         ASAM's:  Six Dimensions of Multidimensional Assessment  Dimension 1:  Acute Intoxication and/or Withdrawal Potential:  Dimension 2:  Biomedical Conditions and Complications:      Dimension 3:  Emotional, Behavioral, or Cognitive Conditions and Complications:     Dimension 4:  Readiness to Change:     Dimension 5:  Relapse, Continued use, or Continued Problem Potential:     Dimension 6:  Recovery/Living Environment:     ASAM Severity Score:    ASAM Recommended Level of Treatment:     Substance use Disorder (SUD)    Recommendations for Services/Supports/Treatments:    Discharge Disposition:    DSM5 Diagnoses: Patient Active Problem List   Diagnosis Date Noted   CAP (community acquired pneumonia) 02/14/2023   Acute pancreatitis 01/17/2023   AKI (acute kidney injury) ([name]) 12/12/2022   Hyperkalemia 10/05/2022   CKD (chronic kidney disease), stage IV ([name]) 09/23/2022   Polysubstance abuse ([name]) 09/13/2022   ARF (acute renal failure) ([name]) 09/09/2022   (HFpEF) heart failure with preserved ejection fraction ([name]) 09/09/2022   Hypertensive urgency 12/09/2021   Impaired functional mobility, balance, and endurance 11/29/2021   Dilated cardiomyopathy ([name]) 11/29/2021   Arthritis 11/29/2021   Diabetic foot infection ([name]) 06/20/2021   Presence of aortocoronary bypass graft 04/16/2021   Iron deficiency anemia 04/04/2021   Atypical chest  pain 04/03/2021   Anemia of chronic disease 12/14/2020   DMII (diabetes mellitus, type 2) ([name]) 08/14/2020   Idiopathic chronic gout of multiple sites without tophus 02/17/2020   Diabetic ulcer of toe of right foot associated with type 2 diabetes mellitus ([name]) 12/17/2019   Vitamin B12 deficiency 12/17/2019   Homelessness 12/13/2019   Cellulitis 12/07/2019   Osteomyelitis ([name]) 07/17/2019   Abscess or cellulitis of toe, right    Toe pain, right-second    MDD (major depressive disorder), recurrent episode, severe ([name]) 06/15/2019   Respiratory distress 04/12/2019   Pneumonia due to severe acute respiratory syndrome coronavirus 2 (SARS-CoV-2) 04/11/2019   COVID-19 virus infection 03/19/2019   Chest pain 10/03/2018   Malnutrition of moderate degree 09/21/2018   MDD (major depressive disorder), recurrent episode, moderate ([name])    Type II diabetes mellitus with renal manifestations ([name]) 05/04/2018   GERD (gastroesophageal reflux disease) 05/04/2018   S/P CABG (coronary artery bypass graft)    Left testicular pain    Depression 02/20/2018   Epididymo-orchitis, acute 02/20/2018   Essential hypertension 02/20/2018   Precordial chest pain 02/20/2018   Acute kidney injury superimposed on chronic kidney disease ([name]) 02/14/2018   HCAP (healthcare-associated pneumonia) 02/14/2018   History of pulmonary embolism 07/04/2017   Acute hyponatremia 05/11/2017   Hyperglycemia due to type 2 diabetes mellitus ([name]) 05/11/2017   CHF (congestive heart failure) ([name]) 04/01/2017   Hepatitis C 12/30/2016   Substance induced mood disorder ([name]) 11/23/2016   Chronic ischemic heart disease 11/12/2016   Paroxysmal A-fib ([name]) 11/12/2016   Back pain 04/18/2016   S/P carotid endarterectomy 11/15/2015   COPD (chronic obstructive pulmonary disease) ([name]) 10/22/2015   Stenosis of cervical spine with myelopathy ([name]) 10/20/2015   MDD (major depressive disorder), recurrent severe, without psychosis ([name])  09/09/2015   History of fusion of cervical spine 08/28/2015   Cocaine use disorder, severe, dependence ([name])    Cocaine abuse with cocaine-induced mood disorder ([name]) 08/14/2015   Gout 07/10/2015   Acute renal failure superimposed on stage 3 chronic kidney disease ([name]) 03/06/2015   Chronic kidney disease, stage III (moderate) ([name]) 03/06/2015   Encounter for general adult medical examination with abnormal findings 02/09/2015   Major depressive disorder, recurrent, severe without psychotic features ([name])  Suicidal ideations 08/15/2014   Cervicalgia 06/28/2014   Lumbar radiculopathy, chronic 06/28/2014   Asthma, chronic 02/03/2014   S/P percutaneous transluminal coronary angioplasty 10/15/2013   3-vessel CAD 06/24/2013   ED (erectile dysfunction) of organic origin 07/07/2012   Hypertension goal BP (blood pressure) < 140/80 04/29/2012   Chondromalacia of left knee 03/19/2012   Hyperlipidemia with target LDL less than 100 02/12/2012   Fibromyalgia 02/12/2012   Pure hypercholesterolemia 02/12/2012   Cocaine use disorder ([name]) 01/10/2012    Class: Acute   Human immunodeficiency virus (HIV) disease ([name]) 12/16/2011   HIV (human immunodeficiency virus infection) ([name]) 08/27/2011   Uncontrolled type 2 diabetes with neuropathy 10/17/2000     Referrals to Alternative Service(s): Referred to Alternative Service(s):   Place:   Date:   Time:    Referred to Alternative Service(s):   Place:   Date:   Time:    Referred to Alternative Service(s):   Place:   Date:   Time:    Referred to Alternative Service(s):   Place:   Date:   Time:     Cleda Clarks, LCSW

## 2023-03-05 NOTE — ED Notes (Signed)
Pt given meal tray and [place] sandwich, dressed out in burgundy scrubs due to expressing SI to EDP

## 2023-03-05 NOTE — ED Notes (Signed)
Patient wanded by security at this time. 

## 2023-03-05 NOTE — ED Notes (Signed)
Pt is having TTS evaluation in room at this time

## 2023-03-05 NOTE — ED Notes (Signed)
Attempt blood draw from [facility] and from [facility] without success.  Second staff member to draw blood.  Pt ambulated to the bathroom to void

## 2023-03-05 NOTE — ED Notes (Signed)
Pt belongings bagged and labeled and placed in cabinet for [facility] (x2 bags)

## 2023-03-05 NOTE — Progress Notes (Signed)
Per TTS CSW Manfred Arch, LCSW:   Disposition: Clinical report given to Sindy Guadeloupe, NP who states patient is psychiatrically cleared, with a recommendation for outpatient follow-up. Patient has been provided resources for housing, as well as substance abuse treatment during each ED visit. Patient currently residing at the [facility], who can also be utilized as a Theatre stage manager. RN Alonna Buckler and Dr. Silverio Lay notified of the recommendation.    This Disposition CSW will now remove pt from [facility] shift report. TOC to assist if there is an identified need in regards to discharge.   Maryjean Ka, MSW, [facility] 03/05/2023 11:29 PM

## 2023-03-05 NOTE — ED Notes (Signed)
EDP notified of hypertension.  Pt also notes that he has not taken his BP med

## 2023-03-06 ENCOUNTER — Other Ambulatory Visit (HOSPITAL_COMMUNITY): Payer: Self-pay

## 2023-03-06 MED ORDER — RILPIVIRINE HCL 25 MG PO TABS
25.0000 mg | ORAL_TABLET | Freq: Every day | ORAL | Status: DC
  Filled 2023-03-06: qty 1

## 2023-03-06 MED ORDER — FUROSEMIDE 20 MG PO TABS
40.0000 mg | ORAL_TABLET | Freq: Every day | ORAL | 0 refills | Status: DC
  Filled 2023-03-06: qty 4, 2d supply, fill #0

## 2023-03-06 MED ORDER — DOLUTEGRAVIR SODIUM 50 MG PO TABS
50.0000 mg | ORAL_TABLET | Freq: Every day | ORAL | Status: DC
  Filled 2023-03-06: qty 1

## 2023-03-06 MED ORDER — FUROSEMIDE 20 MG PO TABS
40.0000 mg | ORAL_TABLET | Freq: Every day | ORAL | 0 refills | Status: DC
  Filled 2023-03-06: qty 8, 4d supply, fill #0

## 2023-03-06 NOTE — ED Notes (Signed)
Patient discharged off unit the home per provider. Patient alert, calm, cooperative, no s/s of distress. Patient discharge information given to patient. Belongings given to patient. Patient ambulatory off unit, escorted by security. Patient given bus pass for transport.

## 2023-03-06 NOTE — ED Provider Notes (Addendum)
72 year old male with history of heart failure, HTN, homelessness, HIV who presented yesterday with complaints of unstable living conditions and cough.  His chest x-ray showed no concerning findings.  His BNP is elevated however troponin down trended from prior.  No acute change or concern for ACS.  He was kept overnight for clearance by TTS.  TOC provided resources.  He has been medically cleared, psychiatrically cleared and cleared by TOC.  Patient assumed kept overnight for a safe place to stay .  His repeat blood pressure is significantly improved.  He was laying down flat with no difficulties breathing or hypoxia.  He will continue his Lasix prescribed for the next 4 days.  Follow-up with PCP.  Return precautions provided.   "Disposition: Clinical report given to Sindy Guadeloupe, NP who states patient is psychiatrically cleared, with a recommendation for outpatient follow-up. Patient has been provided resources for housing, as well as substance abuse treatment during each ED visit. Patient currently residing at the [facility], who can also be utilized as a Theatre stage manager. Mardene Sayer, MD 03/06/23 1610    Mardene Sayer, MD 03/06/23 [phone]

## 2023-03-06 NOTE — Progress Notes (Signed)
TOC consulted to clarify pt's disposition. This CSW referred to [facility] CSWs note and informed the pt is psychiatrically cleared and has received substance abuse and housing resources. Per chart review, pt is currently homeless and resides at the [facility]. No TOC needs at this time.

## 2023-03-06 NOTE — ED Notes (Signed)
Pt got oob wondering room then voided on the floor stated he didn't realize what he was doing.

## 2023-03-06 NOTE — Discharge Instructions (Addendum)
Take all of your medications as prescribed as well as the Lasix prescribed over the next 4 days.  Follow-up with your primary care doctor regarding your chronic medical issues that brought you to the hospital.  Go to behavioral health urgent care listed in your discharge paperwork if any psychiatric issues or complaints.  We have included a list of homeless resources in your discharge paperwork.

## 2023-03-07 ENCOUNTER — Encounter (HOSPITAL_COMMUNITY): Payer: Self-pay

## 2023-03-07 ENCOUNTER — Other Ambulatory Visit (HOSPITAL_COMMUNITY): Payer: Self-pay

## 2023-03-07 ENCOUNTER — Other Ambulatory Visit: Payer: Self-pay

## 2023-03-07 ENCOUNTER — Emergency Department (HOSPITAL_COMMUNITY): Payer: 59

## 2023-03-07 ENCOUNTER — Inpatient Hospital Stay (HOSPITAL_COMMUNITY)
Admission: EM | Admit: 2023-03-07 | Discharge: 2023-03-24 | DRG: 917 | Disposition: A | Discharge status: Home Health | Payer: 59 | Attending: Internal Medicine | Admitting: Internal Medicine

## 2023-03-07 DIAGNOSIS — E440 Moderate protein-calorie malnutrition: Secondary | ICD-10-CM | POA: Diagnosis present

## 2023-03-07 DIAGNOSIS — R110 Nausea: Secondary | ICD-10-CM

## 2023-03-07 DIAGNOSIS — F1910 Other psychoactive substance abuse, uncomplicated: Secondary | ICD-10-CM | POA: Diagnosis present

## 2023-03-07 DIAGNOSIS — E872 Acidosis, unspecified: Secondary | ICD-10-CM | POA: Diagnosis present

## 2023-03-07 DIAGNOSIS — Z83438 Family history of other disorder of lipoprotein metabolism and other lipidemia: Secondary | ICD-10-CM

## 2023-03-07 DIAGNOSIS — E7800 Pure hypercholesterolemia, unspecified: Secondary | ICD-10-CM | POA: Diagnosis present

## 2023-03-07 DIAGNOSIS — Z955 Presence of coronary angioplasty implant and graft: Secondary | ICD-10-CM

## 2023-03-07 DIAGNOSIS — R1084 Generalized abdominal pain: Secondary | ICD-10-CM | POA: Diagnosis present

## 2023-03-07 DIAGNOSIS — E1122 Type 2 diabetes mellitus with diabetic chronic kidney disease: Secondary | ICD-10-CM | POA: Diagnosis present

## 2023-03-07 DIAGNOSIS — Z599 Problem related to housing and economic circumstances, unspecified: Secondary | ICD-10-CM

## 2023-03-07 DIAGNOSIS — K2970 Gastritis, unspecified, without bleeding: Secondary | ICD-10-CM

## 2023-03-07 DIAGNOSIS — T405X1A Poisoning by cocaine, accidental (unintentional), initial encounter: Principal | ICD-10-CM | POA: Diagnosis present

## 2023-03-07 DIAGNOSIS — M797 Fibromyalgia: Secondary | ICD-10-CM | POA: Diagnosis present

## 2023-03-07 DIAGNOSIS — G9341 Metabolic encephalopathy: Secondary | ICD-10-CM | POA: Diagnosis not present

## 2023-03-07 DIAGNOSIS — Z5900 Homelessness unspecified: Secondary | ICD-10-CM

## 2023-03-07 DIAGNOSIS — K640 First degree hemorrhoids: Secondary | ICD-10-CM

## 2023-03-07 DIAGNOSIS — I10 Essential (primary) hypertension: Secondary | ICD-10-CM | POA: Diagnosis present

## 2023-03-07 DIAGNOSIS — R531 Weakness: Principal | ICD-10-CM

## 2023-03-07 DIAGNOSIS — E875 Hyperkalemia: Secondary | ICD-10-CM | POA: Diagnosis present

## 2023-03-07 DIAGNOSIS — Z7951 Long term (current) use of inhaled steroids: Secondary | ICD-10-CM

## 2023-03-07 DIAGNOSIS — Z6824 Body mass index (BMI) 24.0-24.9, adult: Secondary | ICD-10-CM

## 2023-03-07 DIAGNOSIS — F1424 Cocaine dependence with cocaine-induced mood disorder: Secondary | ICD-10-CM | POA: Diagnosis present

## 2023-03-07 DIAGNOSIS — Z89421 Acquired absence of other right toe(s): Secondary | ICD-10-CM

## 2023-03-07 DIAGNOSIS — Z91199 Patient's noncompliance with other medical treatment and regimen due to unspecified reason: Secondary | ICD-10-CM

## 2023-03-07 DIAGNOSIS — Z5941 Food insecurity: Secondary | ICD-10-CM

## 2023-03-07 DIAGNOSIS — N184 Chronic kidney disease, stage 4 (severe): Secondary | ICD-10-CM | POA: Diagnosis present

## 2023-03-07 DIAGNOSIS — Z833 Family history of diabetes mellitus: Secondary | ICD-10-CM

## 2023-03-07 DIAGNOSIS — R12 Heartburn: Secondary | ICD-10-CM

## 2023-03-07 DIAGNOSIS — F1994 Other psychoactive substance use, unspecified with psychoactive substance-induced mood disorder: Secondary | ICD-10-CM | POA: Diagnosis present

## 2023-03-07 DIAGNOSIS — R194 Change in bowel habit: Secondary | ICD-10-CM

## 2023-03-07 DIAGNOSIS — Z8249 Family history of ischemic heart disease and other diseases of the circulatory system: Secondary | ICD-10-CM

## 2023-03-07 DIAGNOSIS — Z951 Presence of aortocoronary bypass graft: Secondary | ICD-10-CM

## 2023-03-07 DIAGNOSIS — Z7982 Long term (current) use of aspirin: Secondary | ICD-10-CM

## 2023-03-07 DIAGNOSIS — Z608 Other problems related to social environment: Secondary | ICD-10-CM | POA: Diagnosis present

## 2023-03-07 DIAGNOSIS — I5033 Acute on chronic diastolic (congestive) heart failure: Secondary | ICD-10-CM | POA: Diagnosis present

## 2023-03-07 DIAGNOSIS — G928 Other toxic encephalopathy: Secondary | ICD-10-CM | POA: Diagnosis present

## 2023-03-07 DIAGNOSIS — Z79899 Other long term (current) drug therapy: Secondary | ICD-10-CM

## 2023-03-07 DIAGNOSIS — R5381 Other malaise: Secondary | ICD-10-CM | POA: Diagnosis present

## 2023-03-07 DIAGNOSIS — D124 Benign neoplasm of descending colon: Secondary | ICD-10-CM

## 2023-03-07 DIAGNOSIS — K3189 Other diseases of stomach and duodenum: Secondary | ICD-10-CM | POA: Diagnosis present

## 2023-03-07 DIAGNOSIS — Z91148 Patient's other noncompliance with medication regimen for other reason: Secondary | ICD-10-CM

## 2023-03-07 DIAGNOSIS — R1030 Lower abdominal pain, unspecified: Secondary | ICD-10-CM | POA: Diagnosis present

## 2023-03-07 DIAGNOSIS — D631 Anemia in chronic kidney disease: Secondary | ICD-10-CM | POA: Diagnosis present

## 2023-03-07 DIAGNOSIS — F39 Unspecified mood [affective] disorder: Secondary | ICD-10-CM | POA: Diagnosis present

## 2023-03-07 DIAGNOSIS — I160 Hypertensive urgency: Secondary | ICD-10-CM | POA: Diagnosis present

## 2023-03-07 DIAGNOSIS — Z86711 Personal history of pulmonary embolism: Secondary | ICD-10-CM

## 2023-03-07 DIAGNOSIS — K5909 Other constipation: Secondary | ICD-10-CM

## 2023-03-07 DIAGNOSIS — I2510 Atherosclerotic heart disease of native coronary artery without angina pectoris: Secondary | ICD-10-CM | POA: Diagnosis present

## 2023-03-07 DIAGNOSIS — E1129 Type 2 diabetes mellitus with other diabetic kidney complication: Secondary | ICD-10-CM | POA: Diagnosis present

## 2023-03-07 DIAGNOSIS — J449 Chronic obstructive pulmonary disease, unspecified: Secondary | ICD-10-CM | POA: Diagnosis present

## 2023-03-07 DIAGNOSIS — N185 Chronic kidney disease, stage 5: Secondary | ICD-10-CM | POA: Diagnosis present

## 2023-03-07 DIAGNOSIS — R45851 Suicidal ideations: Secondary | ICD-10-CM | POA: Diagnosis present

## 2023-03-07 DIAGNOSIS — I480 Paroxysmal atrial fibrillation: Secondary | ICD-10-CM | POA: Diagnosis present

## 2023-03-07 DIAGNOSIS — D509 Iron deficiency anemia, unspecified: Secondary | ICD-10-CM | POA: Diagnosis present

## 2023-03-07 DIAGNOSIS — Z21 Asymptomatic human immunodeficiency virus [HIV] infection status: Secondary | ICD-10-CM | POA: Diagnosis present

## 2023-03-07 DIAGNOSIS — J189 Pneumonia, unspecified organism: Secondary | ICD-10-CM | POA: Diagnosis present

## 2023-03-07 DIAGNOSIS — I132 Hypertensive heart and chronic kidney disease with heart failure and with stage 5 chronic kidney disease, or end stage renal disease: Secondary | ICD-10-CM | POA: Diagnosis present

## 2023-03-07 DIAGNOSIS — K2990 Gastroduodenitis, unspecified, without bleeding: Secondary | ICD-10-CM | POA: Diagnosis present

## 2023-03-07 DIAGNOSIS — G8929 Other chronic pain: Secondary | ICD-10-CM | POA: Diagnosis present

## 2023-03-07 DIAGNOSIS — N179 Acute kidney failure, unspecified: Secondary | ICD-10-CM | POA: Diagnosis present

## 2023-03-07 DIAGNOSIS — Y929 Unspecified place or not applicable: Secondary | ICD-10-CM

## 2023-03-07 DIAGNOSIS — F32A Depression, unspecified: Secondary | ICD-10-CM | POA: Diagnosis present

## 2023-03-07 DIAGNOSIS — B20 Human immunodeficiency virus [HIV] disease: Secondary | ICD-10-CM | POA: Diagnosis present

## 2023-03-07 LAB — URINALYSIS, ROUTINE W REFLEX MICROSCOPIC
Bilirubin Urine: NEGATIVE
Glucose, UA: 50 mg/dL — AB
Ketones, ur: 5 mg/dL — AB
Leukocytes,Ua: NEGATIVE
Nitrite: NEGATIVE
Protein, ur: >=300 mg/dL — AB
Specific Gravity, Urine: 1.013 (ref 1.005–1.030)
pH: 5 (ref 5.0–8.0)

## 2023-03-07 LAB — LACTIC ACID, PLASMA
Lactic Acid, Venous: 4.4 mmol/L (ref 0.5–1.9)
Lactic Acid, Venous: 1.2 mmol/L (ref 0.5–1.9)

## 2023-03-07 LAB — CBC
HCT: 29.2 % — ABNORMAL LOW (ref 39.0–52.0)
Hemoglobin: 9.1 g/dL — ABNORMAL LOW (ref 13.0–17.0)
MCH: 28 pg (ref 26.0–34.0)
MCHC: 31.2 g/dL (ref 30.0–36.0)
MCV: 89.8 fL (ref 80.0–100.0)
Platelets: 152 10*3/uL (ref 150–400)
RBC: 3.25 MIL/uL — ABNORMAL LOW (ref 4.22–5.81)
RDW: 14.4 % (ref 11.5–15.5)
WBC: 5.5 10*3/uL (ref 4.0–10.5)
nRBC: 0 % (ref 0.0–0.2)

## 2023-03-07 LAB — MAGNESIUM: Magnesium: 1.8 mg/dL (ref 1.7–2.4)

## 2023-03-07 LAB — TROPONIN I (HIGH SENSITIVITY)
Troponin I (High Sensitivity): 32 ng/L — ABNORMAL HIGH (ref <18)
Troponin I (High Sensitivity): 57 ng/L — ABNORMAL HIGH (ref <18)

## 2023-03-07 LAB — COMPREHENSIVE METABOLIC PANEL
ALT: 51 U/L — ABNORMAL HIGH (ref 0–44)
AST: 32 U/L (ref 15–41)
Albumin: 3.3 g/dL — ABNORMAL LOW (ref 3.5–5.0)
Alkaline Phosphatase: 98 U/L (ref 38–126)
Anion gap: 14 (ref 5–15)
BUN: 76 mg/dL — ABNORMAL HIGH (ref 8–23)
CO2: 13 mmol/L — ABNORMAL LOW (ref 22–32)
Calcium: 8 mg/dL — ABNORMAL LOW (ref 8.9–10.3)
Chloride: 108 mmol/L (ref 98–111)
Creatinine, Ser: 5.04 mg/dL — ABNORMAL HIGH (ref 0.61–1.24)
GFR, Estimated: 11 mL/min — ABNORMAL LOW (ref >60)
Glucose, Bld: 213 mg/dL — ABNORMAL HIGH (ref 70–99)
Potassium: 5.1 mmol/L (ref 3.5–5.1)
Sodium: 135 mmol/L (ref 135–145)
Total Bilirubin: 0.6 mg/dL (ref 0.3–1.2)
Total Protein: 7.2 g/dL (ref 6.5–8.1)

## 2023-03-07 LAB — I-STAT CHEM 8, ED
BUN: 84 mg/dL — ABNORMAL HIGH (ref 8–23)
Calcium, Ion: 1.07 mmol/L — ABNORMAL LOW (ref 1.15–1.40)
Chloride: 112 mmol/L — ABNORMAL HIGH (ref 98–111)
Creatinine, Ser: 5.5 mg/dL — ABNORMAL HIGH (ref 0.61–1.24)
Glucose, Bld: 205 mg/dL — ABNORMAL HIGH (ref 70–99)
HCT: 28 % — ABNORMAL LOW (ref 39.0–52.0)
Hemoglobin: 9.5 g/dL — ABNORMAL LOW (ref 13.0–17.0)
Potassium: 5.4 mmol/L — ABNORMAL HIGH (ref 3.5–5.1)
Sodium: 141 mmol/L (ref 135–145)
TCO2: 13 mmol/L — ABNORMAL LOW (ref 22–32)

## 2023-03-07 LAB — I-STAT VENOUS BLOOD GAS, ED
Acid-base deficit: 13 mmol/L — ABNORMAL HIGH (ref 0.0–2.0)
Bicarbonate: 13.1 mmol/L — ABNORMAL LOW (ref 20.0–28.0)
Calcium, Ion: 1.08 mmol/L — ABNORMAL LOW (ref 1.15–1.40)
HCT: 27 % — ABNORMAL LOW (ref 39.0–52.0)
Hemoglobin: 9.2 g/dL — ABNORMAL LOW (ref 13.0–17.0)
O2 Saturation: 92 %
Potassium: 5.4 mmol/L — ABNORMAL HIGH (ref 3.5–5.1)
Sodium: 141 mmol/L (ref 135–145)
TCO2: 14 mmol/L — ABNORMAL LOW (ref 22–32)
pCO2, Ven: 28.8 mmHg — ABNORMAL LOW (ref 44–60)
pH, Ven: 7.266 (ref 7.25–7.43)
pO2, Ven: 72 mmHg — ABNORMAL HIGH (ref 32–45)

## 2023-03-07 LAB — GLUCOSE, CAPILLARY
Glucose-Capillary: 163 mg/dL — ABNORMAL HIGH (ref 70–99)
Glucose-Capillary: 249 mg/dL — ABNORMAL HIGH (ref 70–99)

## 2023-03-07 LAB — PHOSPHORUS: Phosphorus: 5.8 mg/dL — ABNORMAL HIGH (ref 2.5–4.6)

## 2023-03-07 LAB — BRAIN NATRIURETIC PEPTIDE: B Natriuretic Peptide: 417.8 pg/mL — ABNORMAL HIGH (ref 0.0–100.0)

## 2023-03-07 LAB — ETHANOL: Alcohol, Ethyl (B): <10 mg/dL (ref <10)

## 2023-03-07 LAB — CBG MONITORING, ED: Glucose-Capillary: 218 mg/dL — ABNORMAL HIGH (ref 70–99)

## 2023-03-07 LAB — SALICYLATE LEVEL: Salicylate Lvl: <7 mg/dL — ABNORMAL LOW (ref 7.0–30.0)

## 2023-03-07 LAB — ACETAMINOPHEN LEVEL: Acetaminophen (Tylenol), Serum: <10 ug/mL — ABNORMAL LOW (ref 10–30)

## 2023-03-07 MED ORDER — CAMPHOR-MENTHOL 0.5-0.5 % EX LOTN: 1.0000 | TOPICAL_LOTION | Freq: Three times a day (TID) | CUTANEOUS | Status: DC | PRN

## 2023-03-07 MED ORDER — SODIUM BICARBONATE 650 MG PO TABS
1300.0000 mg | ORAL_TABLET | Freq: Three times a day (TID) | ORAL | Status: DC
  Administered 2023-03-07 – 2023-03-10 (×8): 1300 mg via ORAL
  Filled 2023-03-07 (×8): qty 2

## 2023-03-07 MED ORDER — ONDANSETRON HCL 4 MG PO TABS: 4.0000 mg | ORAL_TABLET | Freq: Four times a day (QID) | ORAL | Status: DC | PRN

## 2023-03-07 MED ORDER — SODIUM CHLORIDE 0.9% FLUSH
3.0000 mL | Freq: Two times a day (BID) | INTRAVENOUS | Status: DC
  Administered 2023-03-07 – 2023-03-24 (×33): 3 mL via INTRAVENOUS

## 2023-03-07 MED ORDER — ATORVASTATIN CALCIUM 40 MG PO TABS
40.0000 mg | ORAL_TABLET | Freq: Every day | ORAL | Status: DC
  Administered 2023-03-07 – 2023-03-24 (×18): 40 mg via ORAL
  Filled 2023-03-07 (×19): qty 1

## 2023-03-07 MED ORDER — ASPIRIN 81 MG PO CHEW
81.0000 mg | CHEWABLE_TABLET | Freq: Every day | ORAL | Status: DC
  Administered 2023-03-07 – 2023-03-24 (×18): 81 mg via ORAL
  Filled 2023-03-07 (×18): qty 1

## 2023-03-07 MED ORDER — RILPIVIRINE HCL 25 MG PO TABS
25.0000 mg | ORAL_TABLET | Freq: Every day | ORAL | Status: DC
  Administered 2023-03-07 – 2023-03-09 (×3): 25 mg via ORAL
  Filled 2023-03-07 (×4): qty 1

## 2023-03-07 MED ORDER — FUROSEMIDE 10 MG/ML IJ SOLN
40.0000 mg | Freq: Two times a day (BID) | INTRAMUSCULAR | Status: DC
  Administered 2023-03-07 – 2023-03-09 (×4): 40 mg via INTRAVENOUS
  Filled 2023-03-07 (×4): qty 4

## 2023-03-07 MED ORDER — ONDANSETRON HCL 4 MG/2ML IJ SOLN
4.0000 mg | Freq: Four times a day (QID) | INTRAMUSCULAR | Status: DC | PRN
  Administered 2023-03-11: 4 mg via INTRAVENOUS
  Filled 2023-03-07: qty 2

## 2023-03-07 MED ORDER — NEPRO/CARBSTEADY PO LIQD: 237.0000 mL | Freq: Three times a day (TID) | ORAL | Status: DC | PRN

## 2023-03-07 MED ORDER — DULOXETINE HCL 30 MG PO CPEP
30.0000 mg | ORAL_CAPSULE | Freq: Every day | ORAL | Status: DC
  Administered 2023-03-08 – 2023-03-24 (×17): 30 mg via ORAL
  Filled 2023-03-07 (×17): qty 1

## 2023-03-07 MED ORDER — DOCUSATE SODIUM 283 MG RE ENEM: 1.0000 | ENEMA | RECTAL | Status: DC | PRN

## 2023-03-07 MED ORDER — HEPARIN SODIUM (PORCINE) 5000 UNIT/ML IJ SOLN
5000.0000 [IU] | Freq: Three times a day (TID) | INTRAMUSCULAR | Status: DC
  Administered 2023-03-07 – 2023-03-24 (×50): 5000 [IU] via SUBCUTANEOUS
  Filled 2023-03-07 (×46): qty 1

## 2023-03-07 MED ORDER — CALCIUM CARBONATE ANTACID 1250 MG/5ML PO SUSP
500.0000 mg | Freq: Four times a day (QID) | ORAL | Status: DC | PRN
  Administered 2023-03-12: 500 mg via ORAL
  Filled 2023-03-07 (×2): qty 5

## 2023-03-07 MED ORDER — ZOLPIDEM TARTRATE 5 MG PO TABS
5.0000 mg | ORAL_TABLET | Freq: Every evening | ORAL | Status: DC | PRN
  Administered 2023-03-07: 5 mg via ORAL
  Filled 2023-03-07 (×2): qty 1

## 2023-03-07 MED ORDER — HYDRALAZINE HCL 20 MG/ML IJ SOLN
5.0000 mg | INTRAMUSCULAR | Status: DC | PRN
  Administered 2023-03-07 – 2023-03-20 (×3): 5 mg via INTRAVENOUS
  Filled 2023-03-07 (×3): qty 1

## 2023-03-07 MED ORDER — METRONIDAZOLE 500 MG/100ML IV SOLN
500.0000 mg | Freq: Once | INTRAVENOUS | Status: AC
  Administered 2023-03-07: 500 mg via INTRAVENOUS
  Filled 2023-03-07: qty 100

## 2023-03-07 MED ORDER — POLYETHYLENE GLYCOL 3350 17 G PO PACK
17.0000 g | PACK | Freq: Two times a day (BID) | ORAL | Status: DC
  Administered 2023-03-07 – 2023-03-11 (×8): 17 g via ORAL
  Filled 2023-03-07 (×11): qty 1

## 2023-03-07 MED ORDER — VANCOMYCIN HCL 1500 MG/300ML IV SOLN
1500.0000 mg | Freq: Once | INTRAVENOUS | Status: AC
  Administered 2023-03-07: 1500 mg via INTRAVENOUS
  Filled 2023-03-07: qty 300

## 2023-03-07 MED ORDER — INSULIN ASPART 100 UNIT/ML IJ SOLN
0.0000 [IU] | Freq: Three times a day (TID) | INTRAMUSCULAR | Status: DC
  Administered 2023-03-07 – 2023-03-12 (×5): 1 [IU] via SUBCUTANEOUS
  Administered 2023-03-13: 2 [IU] via SUBCUTANEOUS
  Administered 2023-03-13 – 2023-03-14 (×3): 1 [IU] via SUBCUTANEOUS
  Administered 2023-03-15: 2 [IU] via SUBCUTANEOUS
  Administered 2023-03-15 – 2023-03-16 (×2): 1 [IU] via SUBCUTANEOUS
  Administered 2023-03-16: 2 [IU] via SUBCUTANEOUS
  Administered 2023-03-16: 1 [IU] via SUBCUTANEOUS
  Administered 2023-03-17: 0 [IU] via SUBCUTANEOUS
  Administered 2023-03-17 – 2023-03-18 (×3): 1 [IU] via SUBCUTANEOUS
  Administered 2023-03-19: 2 [IU] via SUBCUTANEOUS
  Administered 2023-03-19: 1 [IU] via SUBCUTANEOUS
  Administered 2023-03-20: 2 [IU] via SUBCUTANEOUS
  Administered 2023-03-21 – 2023-03-23 (×3): 1 [IU] via SUBCUTANEOUS

## 2023-03-07 MED ORDER — SORBITOL 70 % SOLN
30.0000 mL | Status: DC | PRN
  Administered 2023-03-11 – 2023-03-16 (×2): 30 mL via ORAL
  Filled 2023-03-07 (×4): qty 30

## 2023-03-07 MED ORDER — SODIUM CHLORIDE 0.9 % IV SOLN
2.0000 g | Freq: Once | INTRAVENOUS | Status: AC
  Administered 2023-03-07: 2 g via INTRAVENOUS
  Filled 2023-03-07: qty 12.5

## 2023-03-07 MED ORDER — ACETAMINOPHEN 325 MG PO TABS
650.0000 mg | ORAL_TABLET | Freq: Four times a day (QID) | ORAL | Status: DC | PRN
  Administered 2023-03-07 – 2023-03-17 (×4): 650 mg via ORAL
  Filled 2023-03-07 (×5): qty 2

## 2023-03-07 MED ORDER — SODIUM BICARBONATE 650 MG PO TABS
650.0000 mg | ORAL_TABLET | Freq: Three times a day (TID) | ORAL | Status: DC
  Administered 2023-03-07: 650 mg via ORAL
  Filled 2023-03-07: qty 1

## 2023-03-07 MED ORDER — HYDROXYZINE HCL 25 MG PO TABS: 25.0000 mg | ORAL_TABLET | Freq: Three times a day (TID) | ORAL | Status: DC | PRN

## 2023-03-07 MED ORDER — DOLUTEGRAVIR SODIUM 50 MG PO TABS
50.0000 mg | ORAL_TABLET | Freq: Every day | ORAL | Status: DC
  Administered 2023-03-08 – 2023-03-09 (×2): 50 mg via ORAL
  Filled 2023-03-07 (×3): qty 1

## 2023-03-07 MED ORDER — FLUTICASONE FUROATE-VILANTEROL 200-25 MCG/ACT IN AEPB
1.0000 | INHALATION_SPRAY | Freq: Every day | RESPIRATORY_TRACT | Status: DC
  Administered 2023-03-09 – 2023-03-24 (×16): 1 via RESPIRATORY_TRACT
  Filled 2023-03-07 (×2): qty 28

## 2023-03-07 MED ORDER — ALBUTEROL SULFATE HFA 108 (90 BASE) MCG/ACT IN AERS: 1.0000 | INHALATION_SPRAY | Freq: Four times a day (QID) | RESPIRATORY_TRACT | Status: DC | PRN

## 2023-03-07 MED ORDER — DILTIAZEM HCL ER COATED BEADS 180 MG PO CP24
180.0000 mg | ORAL_CAPSULE | Freq: Every day | ORAL | Status: DC
  Administered 2023-03-07 – 2023-03-24 (×18): 180 mg via ORAL
  Filled 2023-03-07 (×18): qty 1

## 2023-03-07 MED ORDER — AMMONIA AROMATIC IN INHA: 1.0000 | Freq: Once | RESPIRATORY_TRACT | Status: DC

## 2023-03-07 MED ORDER — ALBUTEROL SULFATE (2.5 MG/3ML) 0.083% IN NEBU: 2.5000 mg | INHALATION_SOLUTION | Freq: Four times a day (QID) | RESPIRATORY_TRACT | Status: DC | PRN

## 2023-03-07 MED ORDER — ACETAMINOPHEN 650 MG RE SUPP: 650.0000 mg | Freq: Four times a day (QID) | RECTAL | Status: DC | PRN

## 2023-03-07 NOTE — ED Notes (Signed)
ED TO INPATIENT HANDOFF REPORT  ED Nurse Name and Phone #:  Theophilus Bones [phone]  S Name/Age/Gender Michael [name] 72 y.o. male Room/Bed: 018C/018C  Code Status   Code Status: Prior  Home/SNF/Other Pt is said to be homeless Patient oriented to: self, place, time, and situation Is this baseline? Yes   Triage Complete: Triage complete  Chief Complaint Acute metabolic encephalopathy [G93.41]  Triage Note Pt brought in and dropped off by ?? No response and no reaction.    Allergies No Known Allergies  Level of Care/Admitting Diagnosis ED Disposition     ED Disposition  Admit   Condition  --   Comment  Hospital Area: [facility] [[id number]]  Level of Care: Telemetry Medical [104]  May place patient in observation at [facility] or [facility] if equivalent level of care is available:: No  Covid Evaluation: Asymptomatic - no recent exposure (last 10 days) testing not required  Diagnosis: Acute metabolic encephalopathy [[id number]]  Admitting Physician: Jonah Blue [[id number]]  Attending Physician: Jonah Blue [[id number]]          B Medical/Surgery History Past Medical History:  Diagnosis Date   A-fib ([name])    Anemia    Asthma    No PFTs, history of childhood asthma   CAD (coronary artery disease)    Cellulitis 04/2014   left facial   Cellulitis and abscess of toe of right foot 12/08/2019   Chondromalacia of medial femoral condyle    Left knee MRI 04/28/12: Chondromalacia of the medial femoral condyle with slight peripheral degeneration of the meniscocapsular junction of the medial meniscus; followed by sports medicine   Chronic kidney failure, stage 4 (severe) ([name])    Collagen vascular disease ([name])    Crack cocaine use    for 20+ years, has been enrolled in detox programs in the past   Depression    with history of hospitalization for suicidal ideation   Diabetes mellitus 2002   Diagnosed in 2002, started insulin in 2012   Gout     Headache(784.0)    CT head 08/2011: Periventricular and subcortical white matter hypodensities are most in keeping with chronic microangiopathic change   HIV infection ([name]) 08/2011   Followed by Dr. Ninetta Lights   Hyperlipidemia    Hypertension    Pulmonary embolism [facility])    Past Surgical History:  Procedure Laterality Date   AMPUTATION Right 07/21/2019   Procedure: RIGHT SECOND TOE AMPUTATION;  Surgeon: Nadara Mustard, MD;  Location: [facility] OR;  Service: Orthopedics;  Laterality: Right;   BACK SURGERY     1988   BOWEL RESECTION     CARDIAC SURGERY     CERVICAL SPINE SURGERY     " rods in my neck "   CORONARY ARTERY BYPASS GRAFT     CORONARY STENT PLACEMENT     NM MYOCAR PERF WALL MOTION  12/27/2011   normal   SPINE SURGERY       A IV Location/Drains/Wounds Patient Lines/Drains/Airways Status     Active Line/Drains/Airways     Name Placement date Placement time Site Days   Peripheral IV 03/07/23 20 G 1" Anterior;Distal;Left;Upper Arm 03/07/23  1109  Arm  less than 1            Intake/Output Last 24 hours No intake or output data in the 24 hours ending 03/07/23 1340  Labs/Imaging Results for orders placed or performed during the hospital encounter of 03/07/23 (from the past 48 hour(s))  CBG monitoring,  ED     Status: Abnormal   Collection Time: 03/07/23 10:15 AM  Result Value Ref Range   Glucose-Capillary 218 (H) 70 - 99 mg/dL    Comment: Glucose reference range applies only to samples taken after fasting for at least 8 hours.  Comprehensive metabolic panel     Status: Abnormal   Collection Time: 03/07/23 10:22 AM  Result Value Ref Range   Sodium 135 135 - 145 mmol/L   Potassium 5.1 3.5 - 5.1 mmol/L   Chloride 108 98 - 111 mmol/L   CO2 13 (L) 22 - 32 mmol/L   Glucose, Bld 213 (H) 70 - 99 mg/dL    Comment: Glucose reference range applies only to samples taken after fasting for at least 8 hours.   BUN 76 (H) 8 - 23 mg/dL   Creatinine, Ser 1.61 (H) 0.61 - 1.24 mg/dL    Calcium 8.0 (L) 8.9 - 10.3 mg/dL   Total Protein 7.2 6.5 - 8.1 g/dL   Albumin 3.3 (L) 3.5 - 5.0 g/dL   AST 32 15 - 41 U/L   ALT 51 (H) 0 - 44 U/L   Alkaline Phosphatase 98 38 - 126 U/L   Total Bilirubin 0.6 0.3 - 1.2 mg/dL   GFR, Estimated 11 (L) >60 mL/min    Comment: (NOTE) Calculated using the CKD-EPI Creatinine Equation (2021)    Anion gap 14 5 - 15    Comment: Performed at [facility], [address] [place], [place] [address]  CBC     Status: Abnormal   Collection Time: 03/07/23 10:22 AM  Result Value Ref Range   WBC 5.5 4.0 - 10.5 K/uL   RBC 3.25 (L) 4.22 - 5.81 MIL/uL   Hemoglobin 9.1 (L) 13.0 - 17.0 g/dL   HCT 54.0 (L) 98.1 - 19.1 %   MCV 89.8 80.0 - 100.0 fL   MCH 28.0 26.0 - 34.0 pg   MCHC 31.2 30.0 - 36.0 g/dL   RDW 47.8 29.5 - 62.1 %   Platelets 152 150 - 400 K/uL   nRBC 0.0 0.0 - 0.2 %    Comment: Performed at [facility], [address] [place], [place] [address]  I-stat chem 8, ED (not at [facility], [facility] or [facility])     Status: Abnormal   Collection Time: 03/07/23 10:29 AM  Result Value Ref Range   Sodium 141 135 - 145 mmol/L   Potassium 5.4 (H) 3.5 - 5.1 mmol/L   Chloride 112 (H) 98 - 111 mmol/L   BUN 84 (H) 8 - 23 mg/dL   Creatinine, Ser 7.84 (H) 0.61 - 1.24 mg/dL   Glucose, Bld 696 (H) 70 - 99 mg/dL    Comment: Glucose reference range applies only to samples taken after fasting for at least 8 hours.   Calcium, Ion 1.07 (L) 1.15 - 1.40 mmol/L   TCO2 13 (L) 22 - 32 mmol/L   Hemoglobin 9.5 (L) 13.0 - 17.0 g/dL   HCT 29.5 (L) 28.4 - 13.2 %  Brain natriuretic peptide     Status: Abnormal   Collection Time: 03/07/23 10:40 AM  Result Value Ref Range   B Natriuretic Peptide 417.8 (H) 0.0 - 100.0 pg/mL    Comment: Performed at [facility], [address] [place], [place] [address]  Lactic acid, plasma     Status: Abnormal   Collection Time: 03/07/23 10:41 AM  Result Value Ref Range   Lactic Acid, Venous 4.4 (HH) 0.5 -  1.9 mmol/L    Comment:  CRITICAL RESULT CALLED TO, READ BACK BY AND VERIFIED WITH Jewell Haught, C. RN @ 1200 03/07/23 BUTLER J. Performed at [facility], [address], [name], [place] [address]   Troponin I (High Sensitivity)     Status: Abnormal   Collection Time: 03/07/23 10:50 AM  Result Value Ref Range   Troponin I (High Sensitivity) 32 (H) <18 ng/L    Comment: (NOTE) Elevated high sensitivity troponin I (hsTnI) values and significant  changes across serial measurements may suggest ACS but many other  chronic and acute conditions are known to elevate hsTnI results.  Refer to the "Links" section for chest pain algorithms and additional  guidance. Performed at [facility], [address], [place], [place] [address]   Magnesium     Status: None   Collection Time: 03/07/23 10:50 AM  Result Value Ref Range   Magnesium 1.8 1.7 - 2.4 mg/dL    Comment: Performed at [facility], [address], [place], [place] [address]  Phosphorus     Status: Abnormal   Collection Time: 03/07/23 10:50 AM  Result Value Ref Range   Phosphorus 5.8 (H) 2.5 - 4.6 mg/dL    Comment: Performed at [facility], [address], [place], [place] [address]  Acetaminophen level     Status: Abnormal   Collection Time: 03/07/23 10:50 AM  Result Value Ref Range   Acetaminophen (Tylenol), Serum <10 (L) 10 - 30 ug/mL    Comment: (NOTE) Therapeutic concentrations vary significantly. A range of 10-30 ug/mL  may be an effective concentration for many patients. However, some  are best treated at concentrations outside of this range. Acetaminophen concentrations >150 ug/mL at 4 hours after ingestion  and >50 ug/mL at 12 hours after ingestion are often associated with  toxic reactions.  Performed at [facility], [address] [place], [place] [address]   Salicylate level     Status: Abnormal   Collection Time: 03/07/23 10:50 AM  Result Value Ref Range   Salicylate Lvl <7.0 (L) 7.0 - 30.0 mg/dL    Comment:  Performed at [facility], [address] [place], [place] [address]  Ethanol     Status: None   Collection Time: 03/07/23 10:50 AM  Result Value Ref Range   Alcohol, Ethyl (B) <10 <10 mg/dL    Comment: (NOTE) Lowest detectable limit for serum alcohol is 10 mg/dL.  For medical purposes only. Performed at [facility], [address], [place], [place] [address]   I-Stat venous blood gas, [facility] ED, [facility], [facility])     Status: Abnormal   Collection Time: 03/07/23 11:01 AM  Result Value Ref Range   pH, Ven 7.266 7.25 - 7.43   pCO2, Ven 28.8 (L) 44 - 60 mmHg   pO2, Ven 72 (H) 32 - 45 mmHg   Bicarbonate 13.1 (L) 20.0 - 28.0 mmol/L   TCO2 14 (L) 22 - 32 mmol/L   O2 Saturation 92 %   Acid-base deficit 13.0 (H) 0.0 - 2.0 mmol/L   Sodium 141 135 - 145 mmol/L   Potassium 5.4 (H) 3.5 - 5.1 mmol/L   Calcium, Ion 1.08 (L) 1.15 - 1.40 mmol/L   HCT 27.0 (L) 39.0 - 52.0 %   Hemoglobin 9.2 (L) 13.0 - 17.0 g/dL   Sample type VENOUS    CT CHEST ABDOMEN PELVIS WO CONTRAST  Result Date: 03/07/2023 CLINICAL DATA:  Diffuse  pain, acute kidney injury, renal failure. EXAM: CT CHEST, ABDOMEN AND PELVIS WITHOUT CONTRAST TECHNIQUE: Multidetector CT imaging of the chest, abdomen and pelvis was performed following the standard protocol without IV contrast. RADIATION DOSE REDUCTION: This exam was performed according to the departmental dose-optimization program which includes automated exposure control, adjustment of the mA and/or kV according to patient size and/or use of iterative reconstruction technique. COMPARISON:  CT examination dated January 28, 2023 FINDINGS: CT CHEST FINDINGS Cardiovascular: Normal heart size. No pericardial effusion. Coronary artery bypass grafting. Mild aortic atherosclerotic calcifications. Mediastinum/Nodes: No enlarged mediastinal, hilar, or axillary lymph nodes. Thyroid gland, trachea, and esophagus demonstrate no significant findings. Lungs/Pleura: Mild bronchial thickening and  tree-in-bud opacities in the right lower lobe concerning for infectious/inflammatory process (series 3, image 40). No pleural effusion or pneumothorax. Musculoskeletal: No chest wall mass or suspicious bone lesions identified. Sternotomy wires are intact. Diffuse idiopathic skeletal hyperostosis. CT ABDOMEN PELVIS FINDINGS Hepatobiliary: No focal liver abnormality is seen. No gallstones, gallbladder wall thickening, or biliary dilatation. Pancreas: Unremarkable. No pancreatic ductal dilatation or surrounding inflammatory changes. Spleen: Normal in size without focal abnormality. Adrenals/Urinary Tract: Adrenal glands are unremarkable. Kidneys are normal, without renal calculi, focal lesion, or hydronephrosis. Bladder is unremarkable. Stomach/Bowel: Stomach is within normal limits. Appendix not identified. No evidence of bowel wall thickening, distention, or inflammatory changes. Vascular/Lymphatic: Mild aortic atherosclerosis. No enlarged abdominal or pelvic lymph nodes. Reproductive: Postsurgical changes for prior TURP. Other: No abdominal wall hernia or abnormality. No abdominopelvic ascites. Musculoskeletal: No acute or significant osseous findings. Postsurgical changes at L5-S1 spinous processes. IMPRESSION: CT chest: 1. Mild bronchial thickening and tree-in-bud opacities in the right lower lobe concerning for infectious/inflammatory process. Clinical correlation and follow-up examination to resolution is recommended. 2. Evidence of prior coronary artery bypass grafting. CT abdomen/pelvis: 1. No CT evidence of acute abdominal/pelvic process. 2. Aortic atherosclerosis. 3. Postsurgical changes of prior TURP Aortic Atherosclerosis (ICD10-I70.0). Electronically Signed   By: Larose Hires D.O.   On: 03/07/2023 12:52   CT Head Wo Contrast  Result Date: 03/07/2023 CLINICAL DATA:  Altered mental status. Pain. EXAM: CT HEAD WITHOUT CONTRAST CT CERVICAL SPINE WITHOUT CONTRAST TECHNIQUE: Multidetector CT imaging of the  head and cervical spine was performed following the standard protocol without intravenous contrast. Multiplanar CT image reconstructions of the cervical spine were also generated. RADIATION DOSE REDUCTION: This exam was performed according to the departmental dose-optimization program which includes automated exposure control, adjustment of the mA and/or kV according to patient size and/or use of iterative reconstruction technique. COMPARISON:  CT head and cervical spine 11/12/2022. FINDINGS: CT HEAD FINDINGS Brain: No acute hemorrhage. Unchanged moderate to severe chronic small-vessel disease. Cortical gray-white differentiation is otherwise preserved. Prominence of the ventricles and sulci within expected range for age. No hydrocephalus or extra-axial collection. No mass effect or midline shift. 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 Vascular: No hyperdense vessel or unexpected calcification. Skull: No calvarial fracture or suspicious bone lesion. Skull base is unremarkable. Sinuses/Orbits: Unremarkable. Other: None. CT CERVICAL SPINE FINDINGS Alignment: Normal. Skull base and vertebrae: No acute fracture. Stable postoperative changes of C3-C7 ACDF. Hardware is intact. No associated lucency. Soft tissues and spinal canal: No prevertebral fluid or swelling. No visible canal hematoma. Disc levels:  No high-grade spinal canal stenosis. Upper chest: Unremarkable. Other: Atherosclerotic calcifications of the carotid bulbs. IMPRESSION: 1. No acute intracranial abnormality. Unchanged moderate to severe chronic small-vessel disease. 2. No acute fracture or traumatic listhesis of the cervical spine. Stable postoperative  changes of C3-C7 ACDF. Electronically Signed   By: Orvan Falconer M.D.   On: 03/07/2023 12:30   CT Cervical Spine Wo Contrast  Result Date: 03/07/2023 CLINICAL DATA:  Altered mental status. Pain. EXAM: CT HEAD WITHOUT CONTRAST CT CERVICAL SPINE WITHOUT CONTRAST TECHNIQUE: Multidetector CT  imaging of the head and cervical spine was performed following the standard protocol without intravenous contrast. Multiplanar CT image reconstructions of the cervical spine were also generated. RADIATION DOSE REDUCTION: This exam was performed according to the departmental dose-optimization program which includes automated exposure control, adjustment of the mA and/or kV according to patient size and/or use of iterative reconstruction technique. COMPARISON:  CT head and cervical spine 11/12/2022. FINDINGS: CT HEAD FINDINGS Brain: No acute hemorrhage. Unchanged moderate to severe chronic small-vessel disease. Cortical gray-white differentiation is otherwise preserved. Prominence of the ventricles and sulci within expected range for age. No hydrocephalus or extra-axial collection. No mass effect or midline shift. 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 Vascular: No hyperdense vessel or unexpected calcification. Skull: No calvarial fracture or suspicious bone lesion. Skull base is unremarkable. Sinuses/Orbits: Unremarkable. Other: None. CT CERVICAL SPINE FINDINGS Alignment: Normal. Skull base and vertebrae: No acute fracture. Stable postoperative changes of C3-C7 ACDF. Hardware is intact. No associated lucency. Soft tissues and spinal canal: No prevertebral fluid or swelling. No visible canal hematoma. Disc levels:  No high-grade spinal canal stenosis. Upper chest: Unremarkable. Other: Atherosclerotic calcifications of the carotid bulbs. IMPRESSION: 1. No acute intracranial abnormality. Unchanged moderate to severe chronic small-vessel disease. 2. No acute fracture or traumatic listhesis of the cervical spine. Stable postoperative changes of C3-C7 ACDF. Electronically Signed   By: Orvan Falconer M.D.   On: 03/07/2023 12:30   DG Chest 2 View  Result Date: 03/05/2023 CLINICAL DATA:  Cough EXAM: CHEST - 2 VIEW COMPARISON:  02/14/2023 FINDINGS: Cardiac shadow is stable. Postsurgical changes are again  noted and stable. Lungs are well aerated bilaterally. No focal infiltrate or sizable effusion is seen. Coronary stents are noted. No acute bony abnormality is noted. IMPRESSION: No active cardiopulmonary disease. Electronically Signed   By: Alcide Clever M.D.   On: 03/05/2023 21:12    Pending Labs Unresulted Labs (From admission, onward)     Start     Ordered   03/07/23 1216  Blood culture (routine x 2)  BLOOD CULTURE X 2,   R (with STAT occurrences)      03/07/23 1216   03/07/23 1052  Rapid urine drug screen (hospital performed)  ONCE - STAT,   STAT        03/07/23 1052   03/07/23 1041  Lactic acid, plasma  Now then every 2 hours,   R (with STAT occurrences)      03/07/23 1040   03/07/23 1040  Urinalysis, Routine w reflex microscopic -Urine, Catheterized  Once,   URGENT       Question:  Specimen Source  Answer:  Urine, Catheterized   03/07/23 1040            Vitals/Pain Today's Vitals   03/07/23 1045 03/07/23 1100 03/07/23 1110 03/07/23 1130  BP: (!) 145/87 (!) 142/79  (!) 149/81  Pulse: 79 76  73  Resp: 17 19  15   Temp:      TempSrc:      SpO2: 99% 100%  100%  Weight:   74.4 kg   Height:   5\' 8"  (1.727 m)     Isolation Precautions No active isolations  Medications Medications  metroNIDAZOLE (FLAGYL) IVPB 500 mg (500 mg Intravenous New Bag/Given 03/07/23 1337)  vancomycin (VANCOREADY) IVPB 1500 mg/300 mL (has no administration in time range)  ceFEPIme (MAXIPIME) 2 g in sodium chloride 0.9 % 100 mL IVPB (2 g Intravenous New Bag/Given 03/07/23 1245)    Mobility walks     Focused Assessments Neuro Assessment Handoff:  Swallow screen pass? Yes  Cardiac Rhythm: Normal sinus rhythm NIH Stroke Scale  Dizziness Present: No Headache Present: No Interval: Initial Level of Consciousness (1a.)   : Alert, keenly responsive LOC Questions (1b. )   : Answers both questions correctly LOC Commands (1c. )   : Performs both tasks correctly Best Gaze (2. )  : Normal Visual (3.  )  : No visual loss Facial Palsy (4. )    : Normal symmetrical movements Motor Arm, Left (5a. )   : No drift Motor Arm, Right (5b. ) : No drift Motor Leg, Left (6a. )  : No drift Motor Leg, Right (6b. ) : No drift Limb Ataxia (7. ): Absent Sensory (8. )  : Normal, no sensory loss Best Language (9. )  : No aphasia Dysarthria (10. ): Normal Extinction/Inattention (11.)   : No Abnormality Complete NIHSS TOTAL: 0     Neuro Assessment: Within Defined Limits Neuro Checks:   Initial (03/07/23 1100)  Has TPA been given? No If patient is a Neuro Trauma and patient is going to OR before floor call report to 4N Charge nurse: [phone] or [phone]   R Recommendations: See Admitting Provider Note  Report given to:   Additional Notes:

## 2023-03-07 NOTE — Progress Notes (Signed)
Pt currently denies all suicidal ideations at this time.

## 2023-03-07 NOTE — Progress Notes (Signed)
Pharmacy Antibiotic Note  Michael [name] is a 72 y.o. male admitted on 03/07/2023 with sepsis.  Pharmacy has been consulted for Vancomycin dosing.  Scr 5.04 today, elevated compared to baseline which appears to be closer to 3.5. He is afebrile with a normal white blood cell count at 5.5k.   Plan: Give vancomycin 1500 mg IV - follow up to start maintenance vancomycin once renal function returns to baseline Follow up signs and symptoms of infection, cultures, renal function, and ability to narrow   Height: 5\' 8"  (172.7 cm) Weight: 74.4 kg (164 lb 0.4 oz) IBW/kg (Calculated) : 68.4  Temp (24hrs), Avg:98.7 F (37.1 C), Min:98.7 F (37.1 C), Max:98.7 F (37.1 C)  Recent Labs  Lab 03/05/23 2004 03/07/23 1022 03/07/23 1029 03/07/23 1041  WBC 5.2 5.5  --   --   CREATININE 3.62* 5.04* 5.50*  --   LATICACIDVEN  --   --   --  4.4*    Estimated Creatinine Clearance: 11.7 mL/min (A) (by C-G formula based on SCr of 5.5 mg/dL (H)).    No Known Allergies  Antimicrobials this admission:   Dose adjustments this admission:   Microbiology results:  03/07/2023 BCx: sent  UCx:    Sputum:    MRSA PCR:   Thank you for allowing pharmacy to be a part of this patient's care.  Blane Ohara, PharmD  PGY1 Pharmacy Resident

## 2023-03-07 NOTE — Progress Notes (Signed)
Pt arrived to [facility] [place] from ED today, at 1513. Pt is alert and oriented x4, on room air, and endorsed 8/10 generalized pain. Pt is continent of bowel and bladder, and voided a large unmeasured occurrence, due to pt unable to hold urinal well. As a result, a condom cath has been placed on pt. Pt's 2 brothers and 2 sisters are at bedside. BP as 190/107, and was treated with PRN IV Hydralazine 5 mg at 1633. Pt in NAD at this time.

## 2023-03-07 NOTE — ED Triage Notes (Signed)
Pt brought in and dropped off by ?? No response and no reaction.

## 2023-03-07 NOTE — H&P (Signed)
History and Physical    Patient: Michael [name] WJX:[id number] DOB: [birth year] DOA: 03/07/2023 DOS: the patient was seen and examined on 03/07/2023 PCP: [name], MD  Patient coming from: Homeless; NOK: None (per patient)   Chief Complaint: AMS  HPI: Michael [name] is a 72 y.o. male with medical history significant of afib, CAD, stage 4 CKD, DM, HIV, HTN, and HLD presenting with AMS.   He was previously admitted from 5/10-15 for CAP.  He returned on 5/29 with LE edema, cough, and diffuse pain; he was started on BP medications and given Lasix for 5 days.  He was seen in the ER on 5/30 for "unstable living conditions" and cough with negative evaluation.  TTS evaluated him and he was cleared for dc.  He was monitored overnight to ensure safe discharge.  He returned 24 hours later with AMS.  He reports come confusion, problems with his kidneys, chest and diffuse pain.  He hasn't spoken with anyone about the possibility of HD for quite some time.    ER Course:  Homeless, non-compliant with medications.  Confused, but able to converse.  Nothing localizing.  ?uremia - recently admitted for PNA.  ?R-sided PNA, new vs. Old, no apparent symptoms, given broad-spectrum antibiotics.  Making urine.       Review of Systems: As mentioned in the history of present illness. All other systems reviewed and are negative. Past Medical History:  Diagnosis Date   A-fib ([name])    Anemia    Asthma    No PFTs, history of childhood asthma   CAD (coronary artery disease)    Cellulitis 04/2014   left facial   Cellulitis and abscess of toe of right foot 12/08/2019   Chondromalacia of medial femoral condyle    Left knee MRI 04/28/12: Chondromalacia of the medial femoral condyle with slight peripheral degeneration of the meniscocapsular junction of the medial meniscus; followed by sports medicine   Chronic kidney failure, stage 4 (severe) ([name])    Collagen vascular disease ([name])    Crack cocaine use     for 20+ years, has been enrolled in detox programs in the past   Depression    with history of hospitalization for suicidal ideation   Diabetes mellitus 2002   Diagnosed in 2002, started insulin in 2012   Gout    Headache(784.0)    CT head 08/2011: Periventricular and subcortical white matter hypodensities are most in keeping with chronic microangiopathic change   HIV infection ([name]) 08/2011   Followed by Dr. Ninetta Lights   Hyperlipidemia    Hypertension    Pulmonary embolism [facility]    Past Surgical History:  Procedure Laterality Date   AMPUTATION Right 07/21/2019   Procedure: RIGHT SECOND TOE AMPUTATION;  Surgeon: Nadara Mustard, MD;  Location: [facility] OR;  Service: Orthopedics;  Laterality: Right;   BACK SURGERY     1988   BOWEL RESECTION     CARDIAC SURGERY     CERVICAL SPINE SURGERY     " rods in my neck "   CORONARY ARTERY BYPASS GRAFT     CORONARY STENT PLACEMENT     NM MYOCAR PERF WALL MOTION  12/27/2011   normal   SPINE SURGERY     Social History:  reports that he has never smoked. He has never used smokeless tobacco. He reports that he does not currently use drugs after having used the following drugs: "Crack" cocaine and Cocaine. Frequency: 4.00 times per week. He reports that  he does not drink alcohol.  No Known Allergies  Family History  Problem Relation Age of Onset   Diabetes Mother    Hypertension Mother    Hyperlipidemia Mother    Diabetes Father    Cancer Father    Hypertension Father    Diabetes Brother    Heart disease Brother    Diabetes Sister    Colon cancer Neg Hx     Prior to Admission medications   Medication Sig Start Date End Date Taking? Authorizing Provider  albuterol (VENTOLIN HFA) 108 (90 Base) MCG/ACT inhaler Inhale 1-2 puffs into the lungs every 6 (six) hours as needed for wheezing or shortness of breath. 02/19/23   Azucena Fallen, MD  aspirin 81 MG chewable tablet Chew 1 tablet (81 mg total) by mouth daily. 02/19/23   Azucena Fallen, MD  atorvastatin (LIPITOR) 40 MG tablet Take 1 tablet (40 mg total) by mouth daily. 02/19/23 06/19/23  Azucena Fallen, MD  diltiazem (CARDIZEM CD) 180 MG 24 hr capsule Take 1 capsule (180 mg total) by mouth daily. 02/19/23 03/21/23  Azucena Fallen, MD  dolutegravir (TIVICAY) 50 MG tablet Take 1 tablet (50 mg total) by mouth daily before lunch. 02/19/23   Azucena Fallen, MD  DULoxetine (CYMBALTA) 30 MG capsule Take 1 capsule (30 mg total) by mouth daily. 02/19/23   Azucena Fallen, MD  ferrous sulfate 325 (65 FE) MG tablet Take 1 tablet (325 mg total) by mouth daily with breakfast. Patient not taking: Reported on 01/27/2023 12/17/22   Regalado, Jon Billings A, MD  fluticasone (FLONASE) 50 MCG/ACT nasal spray Place 2 sprays into both nostrils daily. 02/19/23   Azucena Fallen, MD  fluticasone furoate-vilanterol (BREO ELLIPTA) 200-25 MCG/ACT AEPB Inhale 1 puff into the lungs daily. 02/19/23   Azucena Fallen, MD  folic acid (FOLVITE) 1 MG tablet Take 1 tablet (1 mg total) by mouth daily. Patient not taking: Reported on 01/27/2023 12/17/22   Regalado, Jon Billings A, MD  furosemide (LASIX) 20 MG tablet Take 2 tablets (40 mg total) by mouth daily for 4 days. 03/06/23 03/10/23  Mardene Sayer, MD  isosorbide mononitrate (IMDUR) 30 MG 24 hr tablet Take 1 tablet (30 mg total) by mouth daily. 02/19/23   Azucena Fallen, MD  nitroGLYCERIN (NITROSTAT) 0.4 MG SL tablet Place 1 tablet (0.4 mg total) under the tongue every 5 (five) minutes as needed for chest pain. 02/19/23   Azucena Fallen, MD  polyethylene glycol (MIRALAX / GLYCOLAX) 17 g packet Take 17 g by mouth 2 (two) times daily. Patient not taking: Reported on 01/27/2023 12/17/22   Regalado, Jon Billings A, MD  QUEtiapine (SEROQUEL) 50 MG tablet Take 1 tablet (50 mg total) by mouth at bedtime. 02/19/23   Azucena Fallen, MD  rilpivirine (EDURANT) 25 MG TABS tablet Take 1 tablet (25 mg total) by mouth daily before lunch. 02/19/23    Azucena Fallen, MD  sodium bicarbonate 650 MG tablet Take 1 tablet (650 mg total) by mouth 3 (three) times daily. 02/19/23 03/21/23  Azucena Fallen, MD    Physical Exam: Vitals:   03/07/23 1110 03/07/23 1130 03/07/23 1300 03/07/23 1423  BP:  (!) 149/81 (!) 170/98   Pulse:  73 72   Resp:  15 18   Temp:    98.1 F (36.7 C)  TempSrc:      SpO2:  100% 100%   Weight: 74.4 kg     Height: 5\' 8"  (1.727 m)  General:  Appears calm and comfortable and is in NAD Eyes:   EOMI, normal lids, iris ENT:  grossly normal hearing, lips & tongue, mmm; poor/absent dentition Neck:  no LAD, masses or thyromegaly Cardiovascular:  RRR, no m/r/g. 1+ LE edema.  Respiratory:   CTA bilaterally with no wheezes/rales/rhonchi.  Normal respiratory effort. Abdomen:  soft, NT, ND Skin:  no rash or induration seen on limited exam Musculoskeletal:  grossly normal tone BUE/BLE, good ROM, no bony abnormality Psychiatric:  blunted mood and affect, speech sparse but appropriate, AOx2-3 Neurologic:  CN 2-12 grossly intact, moves all extremities in coordinated fashion   Radiological Exams on Admission: Independently reviewed - see discussion in A/P where applicable  CT CHEST ABDOMEN PELVIS WO CONTRAST  Result Date: 03/07/2023 CLINICAL DATA:  Diffuse pain, acute kidney injury, renal failure. EXAM: CT CHEST, ABDOMEN AND PELVIS WITHOUT CONTRAST TECHNIQUE: Multidetector CT imaging of the chest, abdomen and pelvis was performed following the standard protocol without IV contrast. RADIATION DOSE REDUCTION: This exam was performed according to the departmental dose-optimization program which includes automated exposure control, adjustment of the mA and/or kV according to patient size and/or use of iterative reconstruction technique. COMPARISON:  CT examination dated January 28, 2023 FINDINGS: CT CHEST FINDINGS Cardiovascular: Normal heart size. No pericardial effusion. Coronary artery bypass grafting. Mild aortic  atherosclerotic calcifications. Mediastinum/Nodes: No enlarged mediastinal, hilar, or axillary lymph nodes. Thyroid gland, trachea, and esophagus demonstrate no significant findings. Lungs/Pleura: Mild bronchial thickening and tree-in-bud opacities in the right lower lobe concerning for infectious/inflammatory process (series 3, image 40). No pleural effusion or pneumothorax. Musculoskeletal: No chest wall mass or suspicious bone lesions identified. Sternotomy wires are intact. Diffuse idiopathic skeletal hyperostosis. CT ABDOMEN PELVIS FINDINGS Hepatobiliary: No focal liver abnormality is seen. No gallstones, gallbladder wall thickening, or biliary dilatation. Pancreas: Unremarkable. No pancreatic ductal dilatation or surrounding inflammatory changes. Spleen: Normal in size without focal abnormality. Adrenals/Urinary Tract: Adrenal glands are unremarkable. Kidneys are normal, without renal calculi, focal lesion, or hydronephrosis. Bladder is unremarkable. Stomach/Bowel: Stomach is within normal limits. Appendix not identified. No evidence of bowel wall thickening, distention, or inflammatory changes. Vascular/Lymphatic: Mild aortic atherosclerosis. No enlarged abdominal or pelvic lymph nodes. Reproductive: Postsurgical changes for prior TURP. Other: No abdominal wall hernia or abnormality. No abdominopelvic ascites. Musculoskeletal: No acute or significant osseous findings. Postsurgical changes at L5-S1 spinous processes. IMPRESSION: CT chest: 1. Mild bronchial thickening and tree-in-bud opacities in the right lower lobe concerning for infectious/inflammatory process. Clinical correlation and follow-up examination to resolution is recommended. 2. Evidence of prior coronary artery bypass grafting. CT abdomen/pelvis: 1. No CT evidence of acute abdominal/pelvic process. 2. Aortic atherosclerosis. 3. Postsurgical changes of prior TURP Aortic Atherosclerosis (ICD10-I70.0). Electronically Signed   By: Larose Hires D.O.    On: 03/07/2023 12:52   CT Head Wo Contrast  Result Date: 03/07/2023 CLINICAL DATA:  Altered mental status. Pain. EXAM: CT HEAD WITHOUT CONTRAST CT CERVICAL SPINE WITHOUT CONTRAST TECHNIQUE: Multidetector CT imaging of the head and cervical spine was performed following the standard protocol without intravenous contrast. Multiplanar CT image reconstructions of the cervical spine were also generated. RADIATION DOSE REDUCTION: This exam was performed according to the departmental dose-optimization program which includes automated exposure control, adjustment of the mA and/or kV according to patient size and/or use of iterative reconstruction technique. COMPARISON:  CT head and cervical spine 11/12/2022. FINDINGS: CT HEAD FINDINGS Brain: No acute hemorrhage. Unchanged moderate to severe chronic small-vessel disease. Cortical gray-white differentiation is otherwise preserved. Prominence of  the ventricles and sulci within expected range for age. No hydrocephalus or extra-axial collection. No mass effect or midline shift. 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 Vascular: No hyperdense vessel or unexpected calcification. Skull: No calvarial fracture or suspicious bone lesion. Skull base is unremarkable. Sinuses/Orbits: Unremarkable. Other: None. CT CERVICAL SPINE FINDINGS Alignment: Normal. Skull base and vertebrae: No acute fracture. Stable postoperative changes of C3-C7 ACDF. Hardware is intact. No associated lucency. Soft tissues and spinal canal: No prevertebral fluid or swelling. No visible canal hematoma. Disc levels:  No high-grade spinal canal stenosis. Upper chest: Unremarkable. Other: Atherosclerotic calcifications of the carotid bulbs. IMPRESSION: 1. No acute intracranial abnormality. Unchanged moderate to severe chronic small-vessel disease. 2. No acute fracture or traumatic listhesis of the cervical spine. Stable postoperative changes of C3-C7 ACDF. Electronically Signed   By: Orvan Falconer  M.D.   On: 03/07/2023 12:30   CT Cervical Spine Wo Contrast  Result Date: 03/07/2023 CLINICAL DATA:  Altered mental status. Pain. EXAM: CT HEAD WITHOUT CONTRAST CT CERVICAL SPINE WITHOUT CONTRAST TECHNIQUE: Multidetector CT imaging of the head and cervical spine was performed following the standard protocol without intravenous contrast. Multiplanar CT image reconstructions of the cervical spine were also generated. RADIATION DOSE REDUCTION: This exam was performed according to the departmental dose-optimization program which includes automated exposure control, adjustment of the mA and/or kV according to patient size and/or use of iterative reconstruction technique. COMPARISON:  CT head and cervical spine 11/12/2022. FINDINGS: CT HEAD FINDINGS Brain: No acute hemorrhage. Unchanged moderate to severe chronic small-vessel disease. Cortical gray-white differentiation is otherwise preserved. Prominence of the ventricles and sulci within expected range for age. No hydrocephalus or extra-axial collection. No mass effect or midline shift. 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 Vascular: No hyperdense vessel or unexpected calcification. Skull: No calvarial fracture or suspicious bone lesion. Skull base is unremarkable. Sinuses/Orbits: Unremarkable. Other: None. CT CERVICAL SPINE FINDINGS Alignment: Normal. Skull base and vertebrae: No acute fracture. Stable postoperative changes of C3-C7 ACDF. Hardware is intact. No associated lucency. Soft tissues and spinal canal: No prevertebral fluid or swelling. No visible canal hematoma. Disc levels:  No high-grade spinal canal stenosis. Upper chest: Unremarkable. Other: Atherosclerotic calcifications of the carotid bulbs. IMPRESSION: 1. No acute intracranial abnormality. Unchanged moderate to severe chronic small-vessel disease. 2. No acute fracture or traumatic listhesis of the cervical spine. Stable postoperative changes of C3-C7 ACDF. Electronically Signed   By:  Orvan Falconer M.D.   On: 03/07/2023 12:30   DG Chest 2 View  Result Date: 03/05/2023 CLINICAL DATA:  Cough EXAM: CHEST - 2 VIEW COMPARISON:  02/14/2023 FINDINGS: Cardiac shadow is stable. Postsurgical changes are again noted and stable. Lungs are well aerated bilaterally. No focal infiltrate or sizable effusion is seen. Coronary stents are noted. No acute bony abnormality is noted. IMPRESSION: No active cardiopulmonary disease. Electronically Signed   By: Alcide Clever M.D.   On: 03/05/2023 21:12    EKG: Independently reviewed.  NSR with rate 87; nonspecific ST changes with no evidence of acute ischemia   Labs on Admission: I have personally reviewed the available labs and imaging studies at the time of the admission.  Pertinent labs:    VBG: 7.266/28.8/13.1 CO2 13 Glucose 213 BUN 76/Creatinine 5.04/GFR 11 BNP 417.8 HS troponin 32 WBC 5.5 Hgb 9.1 Lactate 4.4, 1.2 ETOH <10 ASA <7 UA: 50 glucose, moderate Hgb, 5 ketones, >300 protein, rare bacteria   Assessment and Plan: Principal Problem:  Acute metabolic encephalopathy Active Problems:   Paroxysmal A-fib ([name])   HIV (human immunodeficiency virus infection) ([name])   Human immunodeficiency virus (HIV) disease ([name])   Polysubstance abuse ([name])   COPD (chronic obstructive pulmonary disease) ([name])   Hypertension goal BP (blood pressure) < 140/80   Substance induced mood disorder ([name])   Type II diabetes mellitus with renal manifestations ([name])   S/P CABG (coronary artery bypass graft)   CKD (chronic kidney disease), stage IV ([name])   Pure hypercholesterolemia    Acute metabolic encephalopathy -Patient presenting with encephalopathy as evidenced by his confusion on arrival, now improved -He has had several recent admissions/ER visits -He is homeless -Known HIV, ?treatment -Known progressive CKD, appears to be approaching need for HD  -Evaluation thus far otherwise unremarkable -There is no evidence of infection at this  time -Based on unremarkable evaluation with current ability to protect his airway, will observe for now with telemetry monitoring  Advanced CKD -Progressively worsening creatinine on recent visits  -Making urine only 1-2 times daily -Has acidosis -Nephrology consulted, may need consideration of HD but it is not clear how this will work since he is homeless -For now, will give gentle IVF and follow -Continue HCO3  HIV  -Noncompliant with home medications.   -Previously discussed with ID and he was recommended to resume home medications at discharge from last hospitalization -Continue Tivicay, Edurant  CAD -s/p CABG -Diffuse pain, mildly elevated troponin - will trend troponin but currently doubt ACS -Continue ASA, Lipitor   Cocaine dependence -Cessation encouraged; this should be encouraged on an ongoing basis -UDS ordered   HTN  -Continue diltiazem   Afib  -Rate controlled with diltiazem -Not a good candidate for anticoagulation due to homelessness and medication nonadherence   Diabetes  -A1c I 09/2022 was 6.8, reasonable control -Not on home medications -Cover with very sensitive-scale SSI   COPD -No apparent exacerbation at this time -Continue albuterol, Breo  Mood d/o -Continue duloextine    Advance Care Planning:   Code Status: Full Code - Code status was discussed with the patient and/or family at the time of admission.  The patient would want to receive full resuscitative measures at this time.   Consults: Nephrology; [facility] team; PT/OT  DVT Prophylaxis: Heparin  Family Communication: None present; patient declined having me call family at the time of admission  Severity of Illness: The appropriate patient status for this patient is OBSERVATION. Observation status is judged to be reasonable and necessary in order to provide the required intensity of service to ensure the patient's safety. The patient's presenting symptoms, physical exam findings, and initial  radiographic and laboratory data in the context of their medical condition is felt to place them at decreased risk for further clinical deterioration. Furthermore, it is anticipated that the patient will be medically stable for discharge from the hospital within 2 midnights of admission.   Author: Jonah Blue, MD 03/07/2023 3:29 PM  For on call review [email or web address].

## 2023-03-07 NOTE — Consult Note (Signed)
[facility] Renal Consultation Note  Requesting MD: Ophelia Charter Indication for Consultation: CKD  HPI:  Michael [name] is a 72 y.o. male with DM, HIV, HTN, CAD, Afib as well as CKD.  In addition he is homeless and apparently has an issue with substance abuse. He gets really a lot of his care through the ER-  has had several visits there.  He has CKD that appears to have started in 2019-  since 12/23 crt has basically been in the 3-4 range. He has proteinuria.  He is likely presumed to have CKD due to either DM or HIV-  has had 3 ER vists the last 3 days-  on 5/29 crt was at baseline of 3.62-  then yest crt was 5.0 and today 5.5.  BP was high, then better -  no drug screen.  Apparently when discharged from hosp was alert.  Then when presented today is altered.  Says making urine.  The concern is that he may be uremic and need dialysis.  When I talk to him he is able to answer questions-  he has a pan positive ROS.  Indicates to me that he did do cocaine after he left the ER yesterday -  says he has still been making urine  Creat  Date/Time Value Ref Range Status  04/25/2021 12:00 AM 1.97 (H) 0.70 - 1.28 mg/dL Final  16/07/9603 54:09 AM 1.52 (H) 0.70 - 1.25 mg/dL Final    Comment:    For patients >20 years of age, the reference limit for Creatinine is approximately 13% higher for people identified as African-American. Marland Kitchen   01/08/2016 10:23 AM 1.14 0.70 - 1.25 mg/dL Final  81/19/1478 29:56 AM 1.29 0.50 - 1.35 mg/dL Final  21/30/8657 84:69 AM 1.13 0.50 - 1.35 mg/dL Final  62/95/2841 32:44 PM 1.31 0.50 - 1.35 mg/dL Final  10/09/7251 66:44 AM 1.06 0.50 - 1.35 mg/dL Final  03/47/4259 56:38 PM 1.08 0.50 - 1.35 mg/dL Final  75/64/3329 51:88 AM 1.41 (H) 0.50 - 1.35 mg/dL Final  41/66/0630 16:01 PM 1.03 0.50 - 1.35 mg/dL Final  09/32/3557 32:20 AM 1.07 0.50 - 1.35 mg/dL Final   Creatinine, Ser  Date/Time Value Ref Range Status  03/07/2023 10:29 AM 5.50 (H) 0.61 - 1.24 mg/dL Final   25/42/7062 37:62 AM 5.04 (H) 0.61 - 1.24 mg/dL Final  83/15/1761 60:73 PM 3.62 (H) 0.61 - 1.24 mg/dL Final  71/03/2693 85:46 AM 3.76 (H) 0.61 - 1.24 mg/dL Final  27/12/5007 38:18 AM 3.89 (H) 0.61 - 1.24 mg/dL Final  29/93/7169 67:89 AM 3.82 (H) 0.61 - 1.24 mg/dL Final  38/07/1750 02:58 AM 3.95 (H) 0.61 - 1.24 mg/dL Final  52/77/8242 35:36 AM 4.04 (H) 0.61 - 1.24 mg/dL Final  14/43/1540 08:67 AM 3.80 (H) 0.61 - 1.24 mg/dL Final  61/95/0932 67:12 AM 3.39 (H) 0.61 - 1.24 mg/dL Final  45/80/9983 38:25 PM 3.29 (H) 0.61 - 1.24 mg/dL Final  05/39/7673 41:93 AM 3.35 (H) 0.61 - 1.24 mg/dL Final  79/11/4095 35:32 AM 3.50 (H) 0.61 - 1.24 mg/dL Final  99/24/2683 41:96 AM 3.59 (H) 0.61 - 1.24 mg/dL Final  22/29/7989 21:19 AM 3.23 (H) 0.61 - 1.24 mg/dL Final  41/74/0814 48:18 AM 3.25 (H) 0.61 - 1.24 mg/dL Final  56/31/4970 26:37 AM 2.91 (H) 0.61 - 1.24 mg/dL Final  85/88/5027 74:12 PM 3.29 (H) 0.61 - 1.24 mg/dL Final  87/86/7672 09:47 AM 3.58 (H) 0.61 - 1.24 mg/dL Final  09/62/8366 29:47 PM 3.40 (H) 0.61 - 1.24 mg/dL Final  01/12/2023 11:45 AM 3.55 (H) 0.61 - 1.24 mg/dL Final  16/07/9603 54:09 AM 3.41 (H) 0.61 - 1.24 mg/dL Final  81/19/1478 29:56 PM 4.40 (H) 0.61 - 1.24 mg/dL Final  21/30/8657 84:69 AM 3.59 (H) 0.61 - 1.24 mg/dL Final  62/95/2841 32:44 AM 3.51 (H) 0.61 - 1.24 mg/dL Final  10/09/7251 66:44 AM 3.35 (H) 0.61 - 1.24 mg/dL Final  03/47/4259 56:38 AM 3.50 (H) 0.61 - 1.24 mg/dL Final  75/64/3329 51:88 PM 3.45 (H) 0.61 - 1.24 mg/dL Final  41/66/0630 16:01 AM 4.26 (H) 0.61 - 1.24 mg/dL Final  09/32/3557 32:20 AM 4.84 (H) 0.61 - 1.24 mg/dL Final  25/42/7062 37:62 PM 3.58 (H) 0.61 - 1.24 mg/dL Final  83/15/1761 60:73 PM 3.39 (H) 0.61 - 1.24 mg/dL Final  71/03/2693 85:46 AM 2.84 (H) 0.61 - 1.24 mg/dL Final  27/12/5007 38:18 PM 3.21 (H) 0.61 - 1.24 mg/dL Final  29/93/7169 67:89 PM 3.31 (H) 0.61 - 1.24 mg/dL Final  38/07/1750 02:58 PM 3.38 (H) 0.61 - 1.24 mg/dL Final  52/77/8242 35:36 AM  2.66 (H) 0.61 - 1.24 mg/dL Final  14/43/1540 08:67 AM 2.94 (H) 0.61 - 1.24 mg/dL Final  61/95/0932 67:12 AM 3.55 (H) 0.61 - 1.24 mg/dL Final  45/80/9983 38:25 AM 4.37 (H) 0.61 - 1.24 mg/dL Final  05/39/7673 41:93 AM 3.24 (H) 0.61 - 1.24 mg/dL Final  79/11/4095 35:32 AM 3.13 (H) 0.61 - 1.24 mg/dL Final  99/24/2683 41:96 AM 3.16 (H) 0.61 - 1.24 mg/dL Final  22/29/7989 21:19 AM 3.29 (H) 0.61 - 1.24 mg/dL Final  41/74/0814 48:18 AM 3.33 (H) 0.61 - 1.24 mg/dL Final  56/31/4970 26:37 AM 3.39 (H) 0.61 - 1.24 mg/dL Final  85/88/5027 74:12 AM 3.52 (H) 0.61 - 1.24 mg/dL Final  87/86/7672 09:47 AM 3.30 (H) 0.61 - 1.24 mg/dL Final  09/62/8366 29:47 AM 3.57 (H) 0.61 - 1.24 mg/dL Final  65/46/5035 46:56 AM 3.53 (H) 0.61 - 1.24 mg/dL Final  81/27/5170 01:74 AM 3.65 (H) 0.61 - 1.24 mg/dL Final  94/49/6759 16:38 AM 3.57 (H) 0.61 - 1.24 mg/dL Final     PMHx:   Past Medical History:  Diagnosis Date   A-fib ([name])    Anemia    Asthma    No PFTs, history of childhood asthma   CAD (coronary artery disease)    Cellulitis 04/2014   left facial   Cellulitis and abscess of toe of right foot 12/08/2019   Chondromalacia of medial femoral condyle    Left knee MRI 04/28/12: Chondromalacia of the medial femoral condyle with slight peripheral degeneration of the meniscocapsular junction of the medial meniscus; followed by sports medicine   Chronic kidney failure, stage 4 (severe) ([name])    Collagen vascular disease ([name])    Crack cocaine use    for 20+ years, has been enrolled in detox programs in the past   Depression    with history of hospitalization for suicidal ideation   Diabetes mellitus 2002   Diagnosed in 2002, started insulin in 2012   Gout    Headache(784.0)    CT head 08/2011: Periventricular and subcortical white matter hypodensities are most in keeping with chronic microangiopathic change   HIV infection ([name]) 08/2011   Followed by Dr. Ninetta Lights   Hyperlipidemia    Hypertension    Pulmonary  embolism [facility])     Past Surgical History:  Procedure Laterality Date   AMPUTATION Right 07/21/2019   Procedure: RIGHT SECOND TOE AMPUTATION;  Surgeon: Nadara Mustard, MD;  Location: [facility] OR;  Service:  Orthopedics;  Laterality: Right;   BACK SURGERY     1988   BOWEL RESECTION     CARDIAC SURGERY     CERVICAL SPINE SURGERY     " rods in my neck "   CORONARY ARTERY BYPASS GRAFT     CORONARY STENT PLACEMENT     NM MYOCAR PERF WALL MOTION  12/27/2011   normal   SPINE SURGERY      Family Hx:  Family History  Problem Relation Age of Onset   Diabetes Mother    Hypertension Mother    Hyperlipidemia Mother    Diabetes Father    Cancer Father    Hypertension Father    Diabetes Brother    Heart disease Brother    Diabetes Sister    Colon cancer Neg Hx     Social History:  reports that he has never smoked. He has never used smokeless tobacco. He reports that he does not currently use drugs after having used the following drugs: "Crack" cocaine and Cocaine. Frequency: 4.00 times per week. He reports that he does not drink alcohol.  Allergies: No Known Allergies  Medications: Prior to Admission medications   Medication Sig Start Date End Date Taking? Authorizing Provider  albuterol (VENTOLIN HFA) 108 (90 Base) MCG/ACT inhaler Inhale 1-2 puffs into the lungs every 6 (six) hours as needed for wheezing or shortness of breath. 02/19/23   Azucena Fallen, MD  aspirin 81 MG chewable tablet Chew 1 tablet (81 mg total) by mouth daily. 02/19/23   Azucena Fallen, MD  atorvastatin (LIPITOR) 40 MG tablet Take 1 tablet (40 mg total) by mouth daily. 02/19/23 06/19/23  Azucena Fallen, MD  diltiazem (CARDIZEM CD) 180 MG 24 hr capsule Take 1 capsule (180 mg total) by mouth daily. 02/19/23 03/21/23  Azucena Fallen, MD  dolutegravir (TIVICAY) 50 MG tablet Take 1 tablet (50 mg total) by mouth daily before lunch. 02/19/23   Azucena Fallen, MD  DULoxetine (CYMBALTA) 30 MG capsule Take 1  capsule (30 mg total) by mouth daily. 02/19/23   Azucena Fallen, MD  ferrous sulfate 325 (65 FE) MG tablet Take 1 tablet (325 mg total) by mouth daily with breakfast. Patient not taking: Reported on 01/27/2023 12/17/22   Regalado, Jon Billings A, MD  fluticasone (FLONASE) 50 MCG/ACT nasal spray Place 2 sprays into both nostrils daily. 02/19/23   Azucena Fallen, MD  fluticasone furoate-vilanterol (BREO ELLIPTA) 200-25 MCG/ACT AEPB Inhale 1 puff into the lungs daily. 02/19/23   Azucena Fallen, MD  folic acid (FOLVITE) 1 MG tablet Take 1 tablet (1 mg total) by mouth daily. Patient not taking: Reported on 01/27/2023 12/17/22   Regalado, Jon Billings A, MD  furosemide (LASIX) 20 MG tablet Take 2 tablets (40 mg total) by mouth daily for 4 days. 03/06/23 03/10/23  Mardene Sayer, MD  isosorbide mononitrate (IMDUR) 30 MG 24 hr tablet Take 1 tablet (30 mg total) by mouth daily. 02/19/23   Azucena Fallen, MD  nitroGLYCERIN (NITROSTAT) 0.4 MG SL tablet Place 1 tablet (0.4 mg total) under the tongue every 5 (five) minutes as needed for chest pain. 02/19/23   Azucena Fallen, MD  polyethylene glycol (MIRALAX / GLYCOLAX) 17 g packet Take 17 g by mouth 2 (two) times daily. Patient not taking: Reported on 01/27/2023 12/17/22   Regalado, Jon Billings A, MD  QUEtiapine (SEROQUEL) 50 MG tablet Take 1 tablet (50 mg total) by mouth at bedtime. 02/19/23   Carma Leaven  C, MD  rilpivirine (EDURANT) 25 MG TABS tablet Take 1 tablet (25 mg total) by mouth daily before lunch. 02/19/23   Azucena Fallen, MD  sodium bicarbonate 650 MG tablet Take 1 tablet (650 mg total) by mouth 3 (three) times daily. 02/19/23 03/21/23  Azucena Fallen, MD    I have reviewed the patient's current medications.  Labs:  Results for orders placed or performed during the hospital encounter of 03/07/23 (from the past 48 hour(s))  CBG monitoring, ED     Status: Abnormal   Collection Time: 03/07/23 10:15 AM  Result Value Ref Range    Glucose-Capillary 218 (H) 70 - 99 mg/dL    Comment: Glucose reference range applies only to samples taken after fasting for at least 8 hours.  Comprehensive metabolic panel     Status: Abnormal   Collection Time: 03/07/23 10:22 AM  Result Value Ref Range   Sodium 135 135 - 145 mmol/L   Potassium 5.1 3.5 - 5.1 mmol/L   Chloride 108 98 - 111 mmol/L   CO2 13 (L) 22 - 32 mmol/L   Glucose, Bld 213 (H) 70 - 99 mg/dL    Comment: Glucose reference range applies only to samples taken after fasting for at least 8 hours.   BUN 76 (H) 8 - 23 mg/dL   Creatinine, Ser 1.61 (H) 0.61 - 1.24 mg/dL   Calcium 8.0 (L) 8.9 - 10.3 mg/dL   Total Protein 7.2 6.5 - 8.1 g/dL   Albumin 3.3 (L) 3.5 - 5.0 g/dL   AST 32 15 - 41 U/L   ALT 51 (H) 0 - 44 U/L   Alkaline Phosphatase 98 38 - 126 U/L   Total Bilirubin 0.6 0.3 - 1.2 mg/dL   GFR, Estimated 11 (L) >60 mL/min    Comment: (NOTE) Calculated using the CKD-EPI Creatinine Equation (2021)    Anion gap 14 5 - 15    Comment: Performed at [facility], [address] [place], [place] [address]  CBC     Status: Abnormal   Collection Time: 03/07/23 10:22 AM  Result Value Ref Range   WBC 5.5 4.0 - 10.5 K/uL   RBC 3.25 (L) 4.22 - 5.81 MIL/uL   Hemoglobin 9.1 (L) 13.0 - 17.0 g/dL   HCT 54.0 (L) 98.1 - 19.1 %   MCV 89.8 80.0 - 100.0 fL   MCH 28.0 26.0 - 34.0 pg   MCHC 31.2 30.0 - 36.0 g/dL   RDW 47.8 29.5 - 62.1 %   Platelets 152 150 - 400 K/uL   nRBC 0.0 0.0 - 0.2 %    Comment: Performed at [facility], [address], [place], [place] [address]  I-stat chem 8, ED (not at [facility] or [facility])     Status: Abnormal   Collection Time: 03/07/23 10:29 AM  Result Value Ref Range   Sodium 141 135 - 145 mmol/L   Potassium 5.4 (H) 3.5 - 5.1 mmol/L   Chloride 112 (H) 98 - 111 mmol/L   BUN 84 (H) 8 - 23 mg/dL   Creatinine, Ser 7.84 (H) 0.61 - 1.24 mg/dL   Glucose, Bld 696 (H) 70 - 99 mg/dL    Comment: Glucose reference range applies only to samples taken  after fasting for at least 8 hours.   Calcium, Ion 1.07 (L) 1.15 - 1.40 mmol/L   TCO2 13 (L) 22 - 32 mmol/L   Hemoglobin 9.5 (L) 13.0 - 17.0 g/dL   HCT 29.5 (  L) 39.0 - 52.0 %  Brain natriuretic peptide     Status: Abnormal   Collection Time: 03/07/23 10:40 AM  Result Value Ref Range   B Natriuretic Peptide 417.8 (H) 0.0 - 100.0 pg/mL    Comment: Performed at [facility], [address] [place], [place] [address]  Lactic acid, plasma     Status: Abnormal   Collection Time: 03/07/23 10:41 AM  Result Value Ref Range   Lactic Acid, Venous 4.4 (HH) 0.5 - 1.9 mmol/L    Comment: CRITICAL RESULT CALLED TO, READ BACK BY AND VERIFIED WITH BLANKS, C. RN @ 1200 03/07/23 BUTLER J. Performed at [facility], [address], [place], [place] [address]   Troponin I (High Sensitivity)     Status: Abnormal   Collection Time: 03/07/23 10:50 AM  Result Value Ref Range   Troponin I (High Sensitivity) 32 (H) <18 ng/L    Comment: (NOTE) Elevated high sensitivity troponin I (hsTnI) values and significant  changes across serial measurements may suggest ACS but many other  chronic and acute conditions are known to elevate hsTnI results.  Refer to the "Links" section for chest pain algorithms and additional  guidance. Performed at [facility], [address], [place], [place] [address]   Magnesium     Status: None   Collection Time: 03/07/23 10:50 AM  Result Value Ref Range   Magnesium 1.8 1.7 - 2.4 mg/dL    Comment: Performed at [facility], [address] [place], [place] [address]  Phosphorus     Status: Abnormal   Collection Time: 03/07/23 10:50 AM  Result Value Ref Range   Phosphorus 5.8 (H) 2.5 - 4.6 mg/dL    Comment: Performed at [facility], [address] [place], [place] [address]  Acetaminophen level     Status: Abnormal   Collection Time: 03/07/23 10:50 AM  Result Value Ref Range   Acetaminophen (Tylenol), Serum <10 (L) 10 - 30 ug/mL    Comment:  (NOTE) Therapeutic concentrations vary significantly. A range of 10-30 ug/mL  may be an effective concentration for many patients. However, some  are best treated at concentrations outside of this range. Acetaminophen concentrations >150 ug/mL at 4 hours after ingestion  and >50 ug/mL at 12 hours after ingestion are often associated with  toxic reactions.  Performed at [facility], [address] [place], [place] [address]   Salicylate level     Status: Abnormal   Collection Time: 03/07/23 10:50 AM  Result Value Ref Range   Salicylate Lvl <7.0 (L) 7.0 - 30.0 mg/dL    Comment: Performed at [facility], [address], [place], [place] [address]  Ethanol     Status: None   Collection Time: 03/07/23 10:50 AM  Result Value Ref Range   Alcohol, Ethyl (B) <10 <10 mg/dL    Comment: (NOTE) Lowest detectable limit for serum alcohol is 10 mg/dL.  For medical purposes only. Performed at [facility], [address] [place], [place] [address]   I-Stat venous blood gas, [facility] ED, [facility], [facility])     Status: Abnormal   Collection Time: 03/07/23 11:01 AM  Result Value Ref Range   pH, Ven 7.266 7.25 - 7.43   pCO2, Ven 28.8 (L) 44 - 60 mmHg   pO2, Ven 72 (H) 32 - 45 mmHg   Bicarbonate 13.1 (L) 20.0 - 28.0 mmol/L   TCO2 14 (L) 22 -  32 mmol/L   O2 Saturation 92 %   Acid-base deficit 13.0 (H) 0.0 - 2.0 mmol/L   Sodium 141 135 - 145 mmol/L   Potassium 5.4 (H) 3.5 - 5.1 mmol/L   Calcium, Ion 1.08 (L) 1.15 - 1.40 mmol/L   HCT 27.0 (L) 39.0 - 52.0 %   Hemoglobin 9.2 (L) 13.0 - 17.0 g/dL   Sample type VENOUS   Urinalysis, Routine w reflex microscopic -Urine, Catheterized     Status: Abnormal   Collection Time: 03/07/23 12:50 PM  Result Value Ref Range   Color, Urine YELLOW YELLOW   APPearance CLEAR CLEAR   Specific Gravity, Urine 1.013 1.005 - 1.030   pH 5.0 5.0 - 8.0   Glucose, UA 50 (A) NEGATIVE mg/dL   Hgb urine dipstick MODERATE (A) NEGATIVE   Bilirubin Urine NEGATIVE  NEGATIVE   Ketones, ur 5 (A) NEGATIVE mg/dL   Protein, ur >=409 (A) NEGATIVE mg/dL   Nitrite NEGATIVE NEGATIVE   Leukocytes,Ua NEGATIVE NEGATIVE   RBC / HPF 0-5 0 - 5 RBC/hpf   WBC, UA 0-5 0 - 5 WBC/hpf   Bacteria, UA RARE (A) NONE SEEN   Squamous Epithelial / HPF 0-5 0 - 5 /HPF   Hyaline Casts, UA PRESENT    Granular Casts, UA PRESENT     Comment: Performed at [facility], [address] [place], [place] [address]     ROS:  Pertinent items are noted in HPI. Pt c/o any phyisical sxm that I asked him about-  he does have edema though   Physical Exam: Vitals:   03/07/23 1130 03/07/23 1300  BP: (!) 149/81 (!) 170/98  Pulse: 73 72  Resp: 15 18  Temp:    SpO2: 100% 100%     General: alert, some slow speech but dont know baseline -  thin, unkempt HEENT: PERRLA, EOMI, mucous membranes moist Neck: positive for JVD Heart: RRR Lungs: mostly clear Abdomen: soft, he says tender diffusely  Extremities: pitting edema Skin: warm and dry Neuro: alert, slow-  oriented to person, place   Assessment/Plan: 72 year old BM DM , HTN, progressive CKD since 2019-  now with more acute change in kidney function in the setting of better controlled BP after SBP 200 as well as cocaine use  1.Renal- progressive CKD for the last 5 years with proteinuria.  Likely due to DM, HIV or both exacerbated by poor social situation and non adherence.  There has been an acute change over the last 48 hours-  some swings in BP and cocaine use-  had a lactate of over 4.  I hope that there will be some reversibility in the short term and that he wont require dialysis just yet.  We all know where this is going but dont want to commit him to dialysis too soon.  As an aside, dialysis would be extremely problematic in this guy-  I'm pretty sure he will not be complaint and continue to have the same behaviors.  It is really difficult to tell if he is uremic or not-  he claims to have every sxm but many other sxms as well   2. Hypertension/volume  - overloaded and hypertensive-  using hydralazine-  will also use some lasix.  Trying to keep away from wide swings in BP 3. Acidosis-  will try oral bicarb 4. Hyperkalemia-  again oral bicarb and lokelma if needed 4. Anemia  - is present-  will check iron stores   Michael [name] A  Michael [name] 03/07/2023, 1:55 PM

## 2023-03-07 NOTE — Plan of Care (Signed)

## 2023-03-07 NOTE — ED Provider Notes (Signed)
[facility] EMERGENCY DEPARTMENT AT [facility] Provider Note   CSN: [id number] Arrival date & time: 03/07/23  4098     History  Chief Complaint  Patient presents with   Altered Mental Status    Jayjay Virgil is a 72 y.o. male. With pmh HIV, homelessness, ongoing cocaine abuse, PAF, CKD stage IV, CAD status post CABG, IDDM, medication nonadherence recently discharged from hospital with community-acquired pneumonia presenting with generalized weakness and noting unsure how he got here.  Patient says he went to the [facility] yesterday after leaving [place] long.  He was at the [facility] overnight and unsure how he got here today.  He is complaining of generalized fatigue and weakness.  He has not been taking any of his medications unless he is in the hospital.  He never picked up the Lasix that he was prescribed. He is not endorsing alcohol or drug use overnight.  Endorsing chronic neck pain.  Denying any new pain.  Speaking in whisper on exam, not very conversant or engaging.   Altered Mental Status      Home Medications Prior to Admission medications   Medication Sig Start Date End Date Taking? Authorizing Provider  albuterol (VENTOLIN HFA) 108 (90 Base) MCG/ACT inhaler Inhale 1-2 puffs into the lungs every 6 (six) hours as needed for wheezing or shortness of breath. 02/19/23   Azucena Fallen, MD  aspirin 81 MG chewable tablet Chew 1 tablet (81 mg total) by mouth daily. 02/19/23   Azucena Fallen, MD  atorvastatin (LIPITOR) 40 MG tablet Take 1 tablet (40 mg total) by mouth daily. 02/19/23 06/19/23  Azucena Fallen, MD  diltiazem (CARDIZEM CD) 180 MG 24 hr capsule Take 1 capsule (180 mg total) by mouth daily. 02/19/23 03/21/23  Azucena Fallen, MD  dolutegravir (TIVICAY) 50 MG tablet Take 1 tablet (50 mg total) by mouth daily before lunch. 02/19/23   Azucena Fallen, MD  DULoxetine (CYMBALTA) 30 MG capsule Take 1 capsule (30 mg total) by mouth daily. 02/19/23    Azucena Fallen, MD  ferrous sulfate 325 (65 FE) MG tablet Take 1 tablet (325 mg total) by mouth daily with breakfast. Patient not taking: Reported on 01/27/2023 12/17/22   Regalado, Jon Billings A, MD  fluticasone (FLONASE) 50 MCG/ACT nasal spray Place 2 sprays into both nostrils daily. 02/19/23   Azucena Fallen, MD  fluticasone furoate-vilanterol (BREO ELLIPTA) 200-25 MCG/ACT AEPB Inhale 1 puff into the lungs daily. 02/19/23   Azucena Fallen, MD  folic acid (FOLVITE) 1 MG tablet Take 1 tablet (1 mg total) by mouth daily. Patient not taking: Reported on 01/27/2023 12/17/22   Regalado, Jon Billings A, MD  furosemide (LASIX) 20 MG tablet Take 2 tablets (40 mg total) by mouth daily for 4 days. 03/06/23 03/10/23  Mardene Sayer, MD  isosorbide mononitrate (IMDUR) 30 MG 24 hr tablet Take 1 tablet (30 mg total) by mouth daily. 02/19/23   Azucena Fallen, MD  nitroGLYCERIN (NITROSTAT) 0.4 MG SL tablet Place 1 tablet (0.4 mg total) under the tongue every 5 (five) minutes as needed for chest pain. 02/19/23   Azucena Fallen, MD  polyethylene glycol (MIRALAX / GLYCOLAX) 17 g packet Take 17 g by mouth 2 (two) times daily. Patient not taking: Reported on 01/27/2023 12/17/22   Regalado, Jon Billings A, MD  QUEtiapine (SEROQUEL) 50 MG tablet Take 1 tablet (50 mg total) by mouth at bedtime. 02/19/23   Azucena Fallen, MD  rilpivirine (EDURANT) 25 MG  TABS tablet Take 1 tablet (25 mg total) by mouth daily before lunch. 02/19/23   Azucena Fallen, MD  sodium bicarbonate 650 MG tablet Take 1 tablet (650 mg total) by mouth 3 (three) times daily. 02/19/23 03/21/23  Azucena Fallen, MD      Allergies    Patient has no known allergies.    Review of Systems   Review of Systems  Physical Exam Updated Vital Signs BP (!) 161/81   Pulse 91   Temp 98.7 F (37.1 C) (Oral)   Resp 18   SpO2 99%  Physical Exam Constitutional: Alert and oriented to person and place, speaking in whisper, chronically ill in  appearance Eyes: Conjunctivae are normal. ENT      Head: Normocephalic and atraumatic.      Neck: No stridor.  Left-sided cervical paraspinal tenderness.  Pain with lateral range of motion however no obvious meningismus Cardiovascular: S1, S2, regular rate Respiratory: Normal respiratory effort. Breath sounds are normal. Gastrointestinal: Soft and diffuse tenderness, not peritonitic Musculoskeletal: 2+ equal nontender pitting edema equal in bilateral lower extremities Neurologic: Slightly confused on exam but able to speak in converse and have some conversation.  Diffusely weak on exam 4 out of 5 strength.  Sensation grossly intact.  No facial droop. Skin: Skin is warm, dry Psychiatric: Mildly confused  ED Results / Procedures / Treatments   Labs (all labs ordered are listed, but only abnormal results are displayed) Labs Reviewed  CBG MONITORING, ED - Abnormal; Notable for the following components:      Result Value   Glucose-Capillary 218 (*)    All other components within normal limits  I-STAT CHEM 8, ED - Abnormal; Notable for the following components:   Potassium 5.4 (*)    Chloride 112 (*)    BUN 84 (*)    Creatinine, Ser 5.50 (*)    Glucose, Bld 205 (*)    Calcium, Ion 1.07 (*)    TCO2 13 (*)    Hemoglobin 9.5 (*)    HCT 28.0 (*)    All other components within normal limits  COMPREHENSIVE METABOLIC PANEL  CBC  URINALYSIS, ROUTINE W REFLEX MICROSCOPIC  BRAIN NATRIURETIC PEPTIDE  MAGNESIUM  PHOSPHORUS  LACTIC ACID, PLASMA  LACTIC ACID, PLASMA  ACETAMINOPHEN LEVEL  SALICYLATE LEVEL  ETHANOL  RAPID URINE DRUG SCREEN, HOSP PERFORMED  CBG MONITORING, ED  I-STAT VENOUS BLOOD GAS, ED  TROPONIN I (HIGH SENSITIVITY)    EKG None  Radiology DG Chest 2 View  Result Date: 03/05/2023 CLINICAL DATA:  Cough EXAM: CHEST - 2 VIEW COMPARISON:  02/14/2023 FINDINGS: Cardiac shadow is stable. Postsurgical changes are again noted and stable. Lungs are well aerated bilaterally. No  focal infiltrate or sizable effusion is seen. Coronary stents are noted. No acute bony abnormality is noted. IMPRESSION: No active cardiopulmonary disease. Electronically Signed   By: Alcide Clever M.D.   On: 03/05/2023 21:12    Procedures Procedures    Medications Ordered in ED Medications  ammonia inhalant 1 each (has no administration in time range)    ED Course/ Medical Decision Making/ A&P                             Medical Decision Making Demontez Derousse is a 72 y.o. male. With pmh HIV, homelessness, ongoing cocaine abuse, PAF, CKD stage IV, CAD status post CABG, IDDM, medication nonadherence recently discharged from hospital with community-acquired pneumonia presenting with generalized  weakness and noting unsure how he got here.  Patient's workup remarkable for uptrending BUN today in the 70s as well as worsening creatinine today 5.04 from baseline of approximately 3.  This is in the setting of recent increased diuresis from concern for fluid overload.  On exam he does have pitting edema of the lower extremities but no hypoxia or increased work of breathing and no pulmonary edema on chest x-ray.  BNP is 417 today.  His lactate is also 4.4.  Clinically not consistent with sepsis no hypotension no tachycardia no increased work of breathing.  However recently admitted for pneumonia and pan scan notable for right lower lobe infiltrates concerning for pneumonia.  Cover with broad-spectrum antibiotics.  CT head no ICH personally reviewed by me.  With AKI concern for encephalopathy suspect that uremia although still making urine, requesting admission for continued treatment of pneumonia and AKI.  Amount and/or Complexity of Data Reviewed Labs: ordered. Radiology: ordered.  Risk OTC drugs. Prescription drug management. Decision regarding hospitalization.      Final Clinical Impression(s) / ED Diagnoses Final diagnoses:  None    Rx / DC Orders ED Discharge Orders      None         Mardene Sayer, MD 03/07/23 [phone]

## 2023-03-07 NOTE — ED Notes (Signed)
Called his sister and she was on the phone with him. He told sister his chest hurts a little and BP is up.

## 2023-03-08 ENCOUNTER — Observation Stay (HOSPITAL_BASED_OUTPATIENT_CLINIC_OR_DEPARTMENT_OTHER): Payer: 59

## 2023-03-08 DIAGNOSIS — G9341 Metabolic encephalopathy: Secondary | ICD-10-CM | POA: Diagnosis not present

## 2023-03-08 DIAGNOSIS — J449 Chronic obstructive pulmonary disease, unspecified: Secondary | ICD-10-CM | POA: Diagnosis present

## 2023-03-08 DIAGNOSIS — E440 Moderate protein-calorie malnutrition: Secondary | ICD-10-CM | POA: Diagnosis present

## 2023-03-08 DIAGNOSIS — R109 Unspecified abdominal pain: Secondary | ICD-10-CM | POA: Diagnosis not present

## 2023-03-08 DIAGNOSIS — N179 Acute kidney failure, unspecified: Secondary | ICD-10-CM | POA: Diagnosis present

## 2023-03-08 DIAGNOSIS — R112 Nausea with vomiting, unspecified: Secondary | ICD-10-CM | POA: Diagnosis not present

## 2023-03-08 DIAGNOSIS — B20 Human immunodeficiency virus [HIV] disease: Secondary | ICD-10-CM | POA: Diagnosis present

## 2023-03-08 DIAGNOSIS — K2970 Gastritis, unspecified, without bleeding: Secondary | ICD-10-CM | POA: Diagnosis not present

## 2023-03-08 DIAGNOSIS — I480 Paroxysmal atrial fibrillation: Secondary | ICD-10-CM | POA: Diagnosis present

## 2023-03-08 DIAGNOSIS — R45851 Suicidal ideations: Secondary | ICD-10-CM | POA: Diagnosis present

## 2023-03-08 DIAGNOSIS — Y929 Unspecified place or not applicable: Secondary | ICD-10-CM | POA: Diagnosis not present

## 2023-03-08 DIAGNOSIS — E875 Hyperkalemia: Secondary | ICD-10-CM | POA: Diagnosis present

## 2023-03-08 DIAGNOSIS — E1122 Type 2 diabetes mellitus with diabetic chronic kidney disease: Secondary | ICD-10-CM | POA: Diagnosis present

## 2023-03-08 DIAGNOSIS — T405X1A Poisoning by cocaine, accidental (unintentional), initial encounter: Secondary | ICD-10-CM | POA: Diagnosis present

## 2023-03-08 DIAGNOSIS — Z79899 Other long term (current) drug therapy: Secondary | ICD-10-CM | POA: Diagnosis not present

## 2023-03-08 DIAGNOSIS — R12 Heartburn: Secondary | ICD-10-CM | POA: Diagnosis not present

## 2023-03-08 DIAGNOSIS — R4182 Altered mental status, unspecified: Secondary | ICD-10-CM | POA: Diagnosis not present

## 2023-03-08 DIAGNOSIS — J189 Pneumonia, unspecified organism: Secondary | ICD-10-CM | POA: Diagnosis not present

## 2023-03-08 DIAGNOSIS — D631 Anemia in chronic kidney disease: Secondary | ICD-10-CM | POA: Diagnosis present

## 2023-03-08 DIAGNOSIS — D126 Benign neoplasm of colon, unspecified: Secondary | ICD-10-CM | POA: Diagnosis not present

## 2023-03-08 DIAGNOSIS — N185 Chronic kidney disease, stage 5: Secondary | ICD-10-CM | POA: Diagnosis present

## 2023-03-08 DIAGNOSIS — R110 Nausea: Secondary | ICD-10-CM | POA: Diagnosis not present

## 2023-03-08 DIAGNOSIS — K648 Other hemorrhoids: Secondary | ICD-10-CM | POA: Diagnosis not present

## 2023-03-08 DIAGNOSIS — E872 Acidosis, unspecified: Secondary | ICD-10-CM | POA: Diagnosis present

## 2023-03-08 DIAGNOSIS — N184 Chronic kidney disease, stage 4 (severe): Secondary | ICD-10-CM | POA: Diagnosis not present

## 2023-03-08 DIAGNOSIS — I5033 Acute on chronic diastolic (congestive) heart failure: Secondary | ICD-10-CM | POA: Diagnosis present

## 2023-03-08 DIAGNOSIS — R194 Change in bowel habit: Secondary | ICD-10-CM | POA: Diagnosis not present

## 2023-03-08 DIAGNOSIS — K5900 Constipation, unspecified: Secondary | ICD-10-CM | POA: Diagnosis not present

## 2023-03-08 DIAGNOSIS — D124 Benign neoplasm of descending colon: Secondary | ICD-10-CM | POA: Diagnosis not present

## 2023-03-08 DIAGNOSIS — M7989 Other specified soft tissue disorders: Secondary | ICD-10-CM

## 2023-03-08 DIAGNOSIS — Z5900 Homelessness unspecified: Secondary | ICD-10-CM | POA: Diagnosis not present

## 2023-03-08 DIAGNOSIS — G928 Other toxic encephalopathy: Secondary | ICD-10-CM | POA: Diagnosis present

## 2023-03-08 DIAGNOSIS — D509 Iron deficiency anemia, unspecified: Secondary | ICD-10-CM | POA: Diagnosis not present

## 2023-03-08 DIAGNOSIS — I132 Hypertensive heart and chronic kidney disease with heart failure and with stage 5 chronic kidney disease, or end stage renal disease: Secondary | ICD-10-CM | POA: Diagnosis present

## 2023-03-08 DIAGNOSIS — Z951 Presence of aortocoronary bypass graft: Secondary | ICD-10-CM | POA: Diagnosis not present

## 2023-03-08 DIAGNOSIS — E7800 Pure hypercholesterolemia, unspecified: Secondary | ICD-10-CM | POA: Diagnosis present

## 2023-03-08 DIAGNOSIS — F32A Depression, unspecified: Secondary | ICD-10-CM | POA: Diagnosis present

## 2023-03-08 DIAGNOSIS — F1910 Other psychoactive substance abuse, uncomplicated: Secondary | ICD-10-CM | POA: Diagnosis not present

## 2023-03-08 DIAGNOSIS — G8929 Other chronic pain: Secondary | ICD-10-CM | POA: Diagnosis present

## 2023-03-08 DIAGNOSIS — F1424 Cocaine dependence with cocaine-induced mood disorder: Secondary | ICD-10-CM | POA: Diagnosis present

## 2023-03-08 DIAGNOSIS — I160 Hypertensive urgency: Secondary | ICD-10-CM | POA: Diagnosis present

## 2023-03-08 DIAGNOSIS — F39 Unspecified mood [affective] disorder: Secondary | ICD-10-CM | POA: Diagnosis present

## 2023-03-08 DIAGNOSIS — R1084 Generalized abdominal pain: Secondary | ICD-10-CM | POA: Diagnosis not present

## 2023-03-08 LAB — RAPID URINE DRUG SCREEN, HOSP PERFORMED
Amphetamines: NOT DETECTED
Barbiturates: NOT DETECTED
Benzodiazepines: NOT DETECTED
Cocaine: POSITIVE — AB
Opiates: NOT DETECTED
Tetrahydrocannabinol: NOT DETECTED

## 2023-03-08 LAB — RENAL FUNCTION PANEL
Albumin: 2.8 g/dL — ABNORMAL LOW (ref 3.5–5.0)
Anion gap: 9 (ref 5–15)
BUN: 74 mg/dL — ABNORMAL HIGH (ref 8–23)
CO2: 21 mmol/L — ABNORMAL LOW (ref 22–32)
Calcium: 7.9 mg/dL — ABNORMAL LOW (ref 8.9–10.3)
Chloride: 109 mmol/L (ref 98–111)
Creatinine, Ser: 4.74 mg/dL — ABNORMAL HIGH (ref 0.61–1.24)
GFR, Estimated: 12 mL/min — ABNORMAL LOW (ref >60)
Glucose, Bld: 187 mg/dL — ABNORMAL HIGH (ref 70–99)
Phosphorus: 4.5 mg/dL (ref 2.5–4.6)
Potassium: 4.7 mmol/L (ref 3.5–5.1)
Sodium: 139 mmol/L (ref 135–145)

## 2023-03-08 LAB — CBC
HCT: 25.9 % — ABNORMAL LOW (ref 39.0–52.0)
Hemoglobin: 8.4 g/dL — ABNORMAL LOW (ref 13.0–17.0)
MCH: 28.8 pg (ref 26.0–34.0)
MCHC: 32.4 g/dL (ref 30.0–36.0)
MCV: 88.7 fL (ref 80.0–100.0)
Platelets: 115 10*3/uL — ABNORMAL LOW (ref 150–400)
RBC: 2.92 MIL/uL — ABNORMAL LOW (ref 4.22–5.81)
RDW: 14.3 % (ref 11.5–15.5)
WBC: 4.7 10*3/uL (ref 4.0–10.5)
nRBC: 0 % (ref 0.0–0.2)

## 2023-03-08 LAB — BASIC METABOLIC PANEL
Anion gap: 9 (ref 5–15)
BUN: 73 mg/dL — ABNORMAL HIGH (ref 8–23)
CO2: 19 mmol/L — ABNORMAL LOW (ref 22–32)
Calcium: 7.8 mg/dL — ABNORMAL LOW (ref 8.9–10.3)
Chloride: 111 mmol/L (ref 98–111)
Creatinine, Ser: 4.79 mg/dL — ABNORMAL HIGH (ref 0.61–1.24)
GFR, Estimated: 12 mL/min — ABNORMAL LOW (ref >60)
Glucose, Bld: 107 mg/dL — ABNORMAL HIGH (ref 70–99)
Potassium: 4.4 mmol/L (ref 3.5–5.1)
Sodium: 139 mmol/L (ref 135–145)

## 2023-03-08 LAB — GLUCOSE, CAPILLARY
Glucose-Capillary: 98 mg/dL (ref 70–99)
Glucose-Capillary: 166 mg/dL — ABNORMAL HIGH (ref 70–99)
Glucose-Capillary: 147 mg/dL — ABNORMAL HIGH (ref 70–99)
Glucose-Capillary: 192 mg/dL — ABNORMAL HIGH (ref 70–99)

## 2023-03-08 LAB — IRON AND TIBC
Iron: 42 ug/dL — ABNORMAL LOW (ref 45–182)
Saturation Ratios: 17 % — ABNORMAL LOW (ref 17.9–39.5)
TIBC: 255 ug/dL (ref 250–450)
UIBC: 213 ug/dL

## 2023-03-08 LAB — FERRITIN: Ferritin: 422 ng/mL — ABNORMAL HIGH (ref 24–336)

## 2023-03-08 LAB — CULTURE, BLOOD (ROUTINE X 2)

## 2023-03-08 LAB — CK: Total CK: 260 U/L (ref 49–397)

## 2023-03-08 MED ORDER — SODIUM CHLORIDE 0.9 % IV SOLN
250.0000 mg | Freq: Every day | INTRAVENOUS | Status: AC
  Administered 2023-03-08 – 2023-03-11 (×4): 250 mg via INTRAVENOUS
  Filled 2023-03-08 (×5): qty 20

## 2023-03-08 NOTE — Evaluation (Signed)
Physical Therapy Evaluation Patient Details Name: Michael [name] MRN: [id number] DOB: 05-29-1951 Today's Date: 03/08/2023  History of Present Illness  72 year old male with history of HIV, homelessness, cocaine abuse, PAF, CKD stage IV, CAD status post CABG, IDDM who returns to the hospital on 03/07/23 after a recent admission on 02/14/23  with community-acquired pneumonia as well as suicidal ideation.  This admission pt presents with AMS, chest pain and CKD. He is well-known to our system with frequent ED visits, this being his 12th visit in the past month.   Clinical Impression  Michael [name] is 72 y.o. male admitted with above HPI and diagnosis. Patient is currently limited by functional impairments below (see PT problem list). Patient struggling with safe housing at this time and is independent to mobilize at baseline. Currently he requires min assist with RW for transfers and Mod assist to ambulate short distance with RW. VSS on RA with ambulation of ~2x8' and pt required min assist today to maintain standing balance at sink for self care tasks with OT. Patient will benefit from continued skilled PT interventions to address impairments and progress independence with mobility, recommending <3 hours follow up rehab. Acute PT will follow and progress as able.        Recommendations for follow up therapy are one component of a multi-disciplinary discharge planning process, led by the attending physician.  Recommendations may be updated based on patient status, additional functional criteria and insurance authorization.  Follow Up Recommendations Can patient physically be transported by private vehicle: No     Assistance Recommended at Discharge Frequent or constant Supervision/Assistance  Patient can return home with the following  A lot of help with walking and/or transfers;A little help with bathing/dressing/bathroom;Assistance with cooking/housework;Direct supervision/assist for  medications management;Assist for transportation;Help with stairs or ramp for entrance;Direct supervision/assist for financial management    Equipment Recommendations Rollator (4 wheels)  Recommendations for Other Services       Functional Status Assessment Patient has had a recent decline in their functional status and demonstrates the ability to make significant improvements in function in a reasonable and predictable amount of time.     Precautions / Restrictions Precautions Precautions: Fall Restrictions Weight Bearing Restrictions: No      Mobility  Bed Mobility Overal bed mobility: Needs Assistance Bed Mobility: Supine to Sit, Sit to Supine     Supine to sit: Supervision Sit to supine: Supervision   General bed mobility comments: Increased effort/time    Transfers Overall transfer level: Needs assistance Equipment used: Rolling walker (2 wheels) Transfers: Sit to/from Stand Sit to Stand: Min assist, From elevated surface           General transfer comment: min assist for power up from EOB, pt leaning on bed to aid with rise. good reach back to return to sit EOB at EOS.    Ambulation/Gait Ambulation/Gait assistance: Mod assist, +2 safety/equipment Gait Distance (Feet): 16 Feet (2x8 with standing break at sink) Assistive device: Rolling walker (2 wheels) Gait Pattern/deviations: Step-to pattern, Trunk flexed, Decreased stride length, Shuffle Gait velocity: decr     General Gait Details: very slow pattern, shortened steps with decreased LE clearance throughout and flexed posure.  Stairs            Wheelchair Mobility    Modified Rankin (Stroke Patients Only)       Balance Overall balance assessment: History of Falls, Needs assistance Sitting-balance support: Feet supported, Single extremity supported Sitting balance-Leahy Scale: Good  Standing balance support: Reliant on assistive device for balance, During functional activity, Bilateral  upper extremity supported Standing balance-Leahy Scale: Poor                               Pertinent Vitals/Pain Pain Assessment Breathing: normal Negative Vocalization: none Facial Expression: smiling or inexpressive Body Language: relaxed Consolability: no need to console PAINAD Score: 0 Pain Location: BLEs qually painful, chest and lower back. Pt reports that all pain is chronic. Pain Intervention(s): Limited activity within patient's tolerance, Monitored during session, Repositioned    Home Living Family/patient expects to be discharged to:: Skilled nursing facility (Currently without home. Came from motel but stated that he cannot return)                   Additional Comments: had a RW and a cane, but doesn't know where it is now, has family in [place] but the    Prior Function Prior Level of Function : History of Falls (last six months)             Mobility Comments: several falls in past 6 months, did not have RW or cane with him       Hand Dominance   Dominant Hand: Right    Extremity/Trunk Assessment   Upper Extremity Assessment Upper Extremity Assessment: Defer to OT evaluation RUE Deficits / Details: Generalized weakness. Increased pain with submaximal resistance equivalent to ~4-/5. RUE Sensation: decreased light touch RUE Coordination: decreased fine motor LUE Deficits / Details: same as RUE    Lower Extremity Assessment Lower Extremity Assessment: RLE deficits/detail;LLE deficits/detail RLE Deficits / Details: grossly 3-/5 for hip flex, knee ext/flex, DF/PF. pt with increased pain in Lt LE with resistance testing. LLE Deficits / Details: grossly 3-/5 for hip flex, knee ext/flex, DF/PF. pt with increased pain in Lt LE with resistance testing.    Cervical / Trunk Assessment Cervical / Trunk Assessment: Kyphotic  Communication   Communication: No difficulties (Soft spoken)  Cognition Arousal/Alertness: Awake/alert Behavior  During Therapy: WFL for tasks assessed/performed Overall Cognitive Status: Impaired/Different from baseline Area of Impairment: Orientation, Safety/judgement, Awareness, Problem solving                 Orientation Level: Disoriented to, Situation (slightly)       Safety/Judgement: Decreased awareness of deficits Awareness: Emergent Problem Solving: Slow processing, Requires verbal cues, Difficulty sequencing, Decreased initiation General Comments: Pleasant and cooperative. Oriented grossly.        General Comments      Exercises     Assessment/Plan    PT Assessment Patient needs continued PT services  PT Problem List Decreased balance;Decreased activity tolerance;Decreased mobility       PT Treatment Interventions Gait training;Balance training;Patient/family education    PT Goals (Current goals can be found in the Care Plan section)  Acute Rehab PT Goals Patient Stated Goal: none stated PT Goal Formulation: With patient Time For Goal Achievement: 03/22/23 Potential to Achieve Goals: Good    Frequency Min 3X/week     Co-evaluation   Reason for Co-Treatment: To address functional/ADL transfers   OT goals addressed during session: ADL's and self-care       AM-PAC PT "6 Clicks" Mobility  Outcome Measure Help needed turning from your back to your side while in a flat bed without using bedrails?: A Little Help needed moving from lying on your back to sitting on the side of a  flat bed without using bedrails?: A Little Help needed moving to and from a bed to a chair (including a wheelchair)?: A Little Help needed standing up from a chair using your arms (e.g., wheelchair or bedside chair)?: Total Help needed to walk in hospital room?: A Lot Help needed climbing 3-5 steps with a railing? : Total 6 Click Score: 13    End of Session Equipment Utilized During Treatment: Gait belt Activity Tolerance: Patient tolerated treatment well;Patient limited by  fatigue Patient left: in bed;with call bell/phone within reach;with bed alarm set Nurse Communication: Mobility status PT Visit Diagnosis: Muscle weakness (generalized) (M62.81);History of falling (Z91.81)    Time: 1310-1335 PT Time Calculation (min) (ACUTE ONLY): 25 min   Charges:   PT Evaluation $PT Eval Moderate Complexity: 1 Mod          Wynn Maudlin, DPT Acute Rehabilitation Services Office [phone]  03/08/23 3:15 PM

## 2023-03-08 NOTE — Progress Notes (Signed)
Subjective:  at least 1200 of urine-  BUN and crt down-  currently getting echo  Objective Vital signs in last 24 hours: Vitals:   03/08/23 0000 03/08/23 0400 03/08/23 0705 03/08/23 0755  BP: (!) 140/92 (!) 155/86 (!) 153/89 (!) 174/88  Pulse: 71 70 71 73  Resp: 13 15 14 17   Temp:  97.8 F (36.6 C) 97.8 F (36.6 C) 98.3 F (36.8 C)  TempSrc:  Oral Oral Oral  SpO2: 99% 100% 100% 99%  Weight:      Height:       Weight change:   Intake/Output Summary (Last 24 hours) at 03/08/2023 1029 Last data filed at 03/08/2023 4540 Gross per 24 hour  Intake 425.81 ml  Output 2450 ml  Net -2024.19 ml    Assessment/Plan: 71 year old BM DM , HTN, progressive CKD since 2019-  now with more acute change in kidney function in the setting of better controlled BP after SBP 200 as well as cocaine use  1.Renal- progressive CKD for the last 5 years with proteinuria.  Likely due to DM, HIV or both exacerbated by poor social situation and non adherence.  There had been an acute change over 48 hours to 5/31-  some swings in BP and cocaine use-  had a lactate of over 4.  I hope that there will be some reversibility in the short term and that he wont require dialysis just yet.  We all know where this is going but dont want to commit him to dialysis too soon.  As an aside, dialysis would be extremely problematic in this guy-  I'm pretty sure he will not be complaint and continue to have the same behaviors.  It is really difficult to tell if he is uremic or not-  numbers better so no action needed today  2. Hypertension/volume  - overloaded and hypertensive-  currently on lasix 40 iv q12 and cardizem 180 and prn hydralazine-  140-150 systolic in the sweet spot-  no change in meds right now 3. Acidosis-   oral bicarb 4. Hyperkalemia-  again oral bicarb and lokelma if needed-  better 4. Anemia  - is present-  will check iron stores--- sat 17 will dose with iron       Cecille Aver    Labs: Basic Metabolic  Panel: Recent Labs  Lab 03/05/23 2004 03/07/23 1022 03/07/23 1029 03/07/23 1050 03/07/23 1101 03/08/23 0224  NA 140 135 141  --  141 139  K 4.4 5.1 5.4*  --  5.4* 4.4  CL 111 108 112*  --   --  111  CO2 21* 13*  --   --   --  19*  GLUCOSE 165* 213* 205*  --   --  107*  BUN 60* 76* 84*  --   --  73*  CREATININE 3.62* 5.04* 5.50*  --   --  4.79*  CALCIUM 7.6* 8.0*  --   --   --  7.8*  PHOS  --   --   --  5.8*  --   --    Liver Function Tests: Recent Labs  Lab 03/05/23 2004 03/07/23 1022  AST 26 32  ALT 61* 51*  ALKPHOS 92 98  BILITOT 0.7 0.6  PROT 7.0 7.2  ALBUMIN 3.2* 3.3*   No results for input(s): "LIPASE", "AMYLASE" in the last 168 hours. No results for input(s): "AMMONIA" in the last 168 hours. CBC: Recent Labs  Lab 03/05/23 2004 03/07/23 1022 03/07/23  1029 03/07/23 1101 03/08/23 0224  WBC 5.2 5.5  --   --  4.7  NEUTROABS 3.7  --   --   --   --   HGB 9.3* 9.1* 9.5* 9.2* 8.4*  HCT 29.5* 29.2* 28.0* 27.0* 25.9*  MCV 89.4 89.8  --   --  88.7  PLT 139* 152  --   --  115*   Cardiac Enzymes: No results for input(s): "CKTOTAL", "CKMB", "CKMBINDEX", "TROPONINI" in the last 168 hours. CBG: Recent Labs  Lab 03/07/23 1015 03/07/23 1539 03/07/23 2032 03/08/23 0754  GLUCAP 218* 163* 249* 98    Iron Studies:  Recent Labs    03/08/23 0224  IRON 42*  TIBC 255  FERRITIN 422*   Studies/Results: CT CHEST ABDOMEN PELVIS WO CONTRAST  Result Date: 03/07/2023 CLINICAL DATA:  Diffuse pain, acute kidney injury, renal failure. EXAM: CT CHEST, ABDOMEN AND PELVIS WITHOUT CONTRAST TECHNIQUE: Multidetector CT imaging of the chest, abdomen and pelvis was performed following the standard protocol without IV contrast. RADIATION DOSE REDUCTION: This exam was performed according to the departmental dose-optimization program which includes automated exposure control, adjustment of the mA and/or kV according to patient size and/or use of iterative reconstruction technique.  COMPARISON:  CT examination dated January 28, 2023 FINDINGS: CT CHEST FINDINGS Cardiovascular: Normal heart size. No pericardial effusion. Coronary artery bypass grafting. Mild aortic atherosclerotic calcifications. Mediastinum/Nodes: No enlarged mediastinal, hilar, or axillary lymph nodes. Thyroid gland, trachea, and esophagus demonstrate no significant findings. Lungs/Pleura: Mild bronchial thickening and tree-in-bud opacities in the right lower lobe concerning for infectious/inflammatory process (series 3, image 40). No pleural effusion or pneumothorax. Musculoskeletal: No chest wall mass or suspicious bone lesions identified. Sternotomy wires are intact. Diffuse idiopathic skeletal hyperostosis. CT ABDOMEN PELVIS FINDINGS Hepatobiliary: No focal liver abnormality is seen. No gallstones, gallbladder wall thickening, or biliary dilatation. Pancreas: Unremarkable. No pancreatic ductal dilatation or surrounding inflammatory changes. Spleen: Normal in size without focal abnormality. Adrenals/Urinary Tract: Adrenal glands are unremarkable. Kidneys are normal, without renal calculi, focal lesion, or hydronephrosis. Bladder is unremarkable. Stomach/Bowel: Stomach is within normal limits. Appendix not identified. No evidence of bowel wall thickening, distention, or inflammatory changes. Vascular/Lymphatic: Mild aortic atherosclerosis. No enlarged abdominal or pelvic lymph nodes. Reproductive: Postsurgical changes for prior TURP. Other: No abdominal wall hernia or abnormality. No abdominopelvic ascites. Musculoskeletal: No acute or significant osseous findings. Postsurgical changes at L5-S1 spinous processes. IMPRESSION: CT chest: 1. Mild bronchial thickening and tree-in-bud opacities in the right lower lobe concerning for infectious/inflammatory process. Clinical correlation and follow-up examination to resolution is recommended. 2. Evidence of prior coronary artery bypass grafting. CT abdomen/pelvis: 1. No CT evidence of  acute abdominal/pelvic process. 2. Aortic atherosclerosis. 3. Postsurgical changes of prior TURP Aortic Atherosclerosis (ICD10-I70.0). Electronically Signed   By: Larose Hires D.O.   On: 03/07/2023 12:52   CT Head Wo Contrast  Result Date: 03/07/2023 CLINICAL DATA:  Altered mental status. Pain. EXAM: CT HEAD WITHOUT CONTRAST CT CERVICAL SPINE WITHOUT CONTRAST TECHNIQUE: Multidetector CT imaging of the head and cervical spine was performed following the standard protocol without intravenous contrast. Multiplanar CT image reconstructions of the cervical spine were also generated. RADIATION DOSE REDUCTION: This exam was performed according to the departmental dose-optimization program which includes automated exposure control, adjustment of the mA and/or kV according to patient size and/or use of iterative reconstruction technique. COMPARISON:  CT head and cervical spine 11/12/2022. FINDINGS: CT HEAD FINDINGS Brain: No acute hemorrhage. Unchanged moderate to severe chronic small-vessel disease. Cortical  gray-white differentiation is otherwise preserved. Prominence of the ventricles and sulci within expected range for age. No hydrocephalus or extra-axial collection. No mass effect or midline shift. 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 Vascular: No hyperdense vessel or unexpected calcification. Skull: No calvarial fracture or suspicious bone lesion. Skull base is unremarkable. Sinuses/Orbits: Unremarkable. Other: None. CT CERVICAL SPINE FINDINGS Alignment: Normal. Skull base and vertebrae: No acute fracture. Stable postoperative changes of C3-C7 ACDF. Hardware is intact. No associated lucency. Soft tissues and spinal canal: No prevertebral fluid or swelling. No visible canal hematoma. Disc levels:  No high-grade spinal canal stenosis. Upper chest: Unremarkable. Other: Atherosclerotic calcifications of the carotid bulbs. IMPRESSION: 1. No acute intracranial abnormality. Unchanged moderate to severe  chronic small-vessel disease. 2. No acute fracture or traumatic listhesis of the cervical spine. Stable postoperative changes of C3-C7 ACDF. Electronically Signed   By: Orvan Falconer M.D.   On: 03/07/2023 12:30   CT Cervical Spine Wo Contrast  Result Date: 03/07/2023 CLINICAL DATA:  Altered mental status. Pain. EXAM: CT HEAD WITHOUT CONTRAST CT CERVICAL SPINE WITHOUT CONTRAST TECHNIQUE: Multidetector CT imaging of the head and cervical spine was performed following the standard protocol without intravenous contrast. Multiplanar CT image reconstructions of the cervical spine were also generated. RADIATION DOSE REDUCTION: This exam was performed according to the departmental dose-optimization program which includes automated exposure control, adjustment of the mA and/or kV according to patient size and/or use of iterative reconstruction technique. COMPARISON:  CT head and cervical spine 11/12/2022. FINDINGS: CT HEAD FINDINGS Brain: No acute hemorrhage. Unchanged moderate to severe chronic small-vessel disease. Cortical gray-white differentiation is otherwise preserved. Prominence of the ventricles and sulci within expected range for age. No hydrocephalus or extra-axial collection. No mass effect or midline shift. 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 Vascular: No hyperdense vessel or unexpected calcification. Skull: No calvarial fracture or suspicious bone lesion. Skull base is unremarkable. Sinuses/Orbits: Unremarkable. Other: None. CT CERVICAL SPINE FINDINGS Alignment: Normal. Skull base and vertebrae: No acute fracture. Stable postoperative changes of C3-C7 ACDF. Hardware is intact. No associated lucency. Soft tissues and spinal canal: No prevertebral fluid or swelling. No visible canal hematoma. Disc levels:  No high-grade spinal canal stenosis. Upper chest: Unremarkable. Other: Atherosclerotic calcifications of the carotid bulbs. IMPRESSION: 1. No acute intracranial abnormality. Unchanged  moderate to severe chronic small-vessel disease. 2. No acute fracture or traumatic listhesis of the cervical spine. Stable postoperative changes of C3-C7 ACDF. Electronically Signed   By: Orvan Falconer M.D.   On: 03/07/2023 12:30   Medications: Infusions:   Scheduled Medications:  aspirin  81 mg Oral Daily   atorvastatin  40 mg Oral Daily   diltiazem  180 mg Oral Daily   dolutegravir  50 mg Oral QAC lunch   DULoxetine  30 mg Oral Daily   fluticasone furoate-vilanterol  1 puff Inhalation Daily   furosemide  40 mg Intravenous Q12H   heparin  5,000 Units Subcutaneous Q8H   insulin aspart  0-6 Units Subcutaneous TID WC   polyethylene glycol  17 g Oral BID   rilpivirine  25 mg Oral Q breakfast   sodium bicarbonate  1,300 mg Oral TID   sodium chloride flush  3 mL Intravenous Q12H    have reviewed scheduled and prn medications.  Physical Exam: General: smiling NAD Heart: RRR Lungs: dec BS at bases Abdomen: soft, non tender Extremities: some edema  Dialysis Access: none     03/08/2023,10:29 AM  LOS: 0  days

## 2023-03-08 NOTE — TOC Progression Note (Signed)
Transition of Care [facility]) - Progression Note    Patient Details  Name: Michael [name] MRN: [id number] Date of Birth: March 18, 1951  [facility]) CM/SW Contact  Helene Kelp, [place] Phone Number: 03/08/2023, 5:01 PM  Clinical Narrative:    CSW followed-up clinical team recommendations for SNF placement.   CSW completed referrals for SNF placement.   Clinical Narrative:    CSW followed-up patient disposition regarding SNF Rehab referrals. CSW followed-up with the patient and natural supports to update them on current disposition efforts.   CSW updated members of the clinical team to this writer efforts to support the patient's current disposition.   Disposition follow-up: Follow-up efforts for SNF placement.    No other needs identified at this current time for this Clinical research associate. Weekend CSW to follow and continue efforts and progress   Expected Discharge Plan and Services  Option 1: SNF placement.  Option 2: D/C home with medical recs.     Social Determinants of Health (SDOH) Interventions SDOH Screenings   Food Insecurity: Food Insecurity Present (03/07/2023)  Housing: Patient Unable To Answer (03/07/2023)  Recent Concern: Housing - Medium Risk (02/14/2023)  Transportation Needs: Unmet Transportation Needs (03/07/2023)  Utilities: Not At Risk (03/07/2023)  Alcohol Screen: Low Risk  (11/27/2022)  Depression (PHQ2-9): High Risk (11/12/2021)  Financial Resource Strain: Medium Risk (09/16/2018)  Physical Activity: Unknown (04/22/2019)  Social Connections: Socially Isolated (09/16/2018)  Stress: Stress Concern Present (09/16/2018)  Tobacco Use: Low Risk  (03/07/2023)    Readmission Risk Interventions    02/17/2023    1:01 PM 07/05/2022   10:08 AM  Readmission Risk Prevention Plan  Transportation Screening Complete Complete  Medication Review Oceanographer) Complete Complete  PCP or Specialist appointment within 3-5 days of discharge Complete Complete  HRI or Home  Care Consult Complete Complete  SW Recovery Care/Counseling Consult Complete Complete  Palliative Care Screening Not Applicable Not Applicable  Skilled Nursing Facility Not Applicable Not Applicable

## 2023-03-08 NOTE — Progress Notes (Signed)
Bilateral lower extremity venous study completed.   Preliminary results relayed to MD.  Please see CV Procedures for preliminary results.  Katilyn Miltenberger, RVT  10:47 AM 03/08/23

## 2023-03-08 NOTE — Evaluation (Signed)
Occupational Therapy Evaluation Patient Details Name: Michael [name] MRN: [id number] DOB: Oct 29, 1950 Today's Date: 03/08/2023   History of Present Illness 72 year old male with history of HIV, homelessness, cocaine abuse, PAF, CKD stage IV, CAD status post CABG, IDDM who returns to the hospital on 03/07/23 after a recent admission on 02/14/23  with community-acquired pneumonia as well as suicidal ideation.  This admission pt presents with AMS, chest pain and CKD. He is well-known to our system with frequent ED visits, this being his 12th visit in the past month.   Clinical Impression   Patient is currently requiring assistance with ADLs including up to moderate assist with Lower body ADLs, up to minimal assist with Upper body ADLs,  as well as moderate assist +2 people with functional transfers to toilet.  (Simulated with stand from EOB and ambulating to sink, standing for grooming and ambulating back to bed with RW.   Current level of function is below patient's typical baseline.  During this evaluation, patient was limited by generalized weakness, impaired activity tolerance, and chronic pain of 8/10 to chest, BLEs and lower back, all of which has the potential to impact patient's safety and independence during functional mobility, as well as performance for ADLs.  Patient is currently without a home and is unsure of living opportunities. Pt has family but stated that discharge planning was not discussed with then while they were here. Patient demonstrates fair rehab potential, and should benefit from continued skilled occupational therapy services while in acute care to maximize safety, independence and quality of life at home.  Continued occupational therapy services are recommended.  ?       Recommendations for follow up therapy are one component of a multi-disciplinary discharge planning process, led by the attending physician.  Recommendations may be updated based on patient status,  additional functional criteria and insurance authorization.   Assistance Recommended at Discharge Frequent or constant Supervision/Assistance  Patient can return home with the following A lot of help with walking and/or transfers;Assistance with cooking/housework;Help with stairs or ramp for entrance;Assist for transportation;Direct supervision/assist for financial management;Direct supervision/assist for medications management;A lot of help with bathing/dressing/bathroom;Two people to help with walking and/or transfers    Functional Status Assessment  Patient has had a recent decline in their functional status and demonstrates the ability to make significant improvements in function in a reasonable and predictable amount of time.  Equipment Recommendations   (TBD. Currently, 2 wheeled RW)    Recommendations for Other Services       Precautions / Restrictions Precautions Precautions: Fall Restrictions Weight Bearing Restrictions: No      Mobility Bed Mobility Overal bed mobility: Needs Assistance Bed Mobility: Supine to Sit, Sit to Supine     Supine to sit: Supervision Sit to supine: Supervision   General bed mobility comments: Increased effort/time    Transfers                          Balance Overall balance assessment: History of Falls, Needs assistance Sitting-balance support: Feet supported, Single extremity supported Sitting balance-Leahy Scale: Good     Standing balance support: Reliant on assistive device for balance, During functional activity, Bilateral upper extremity supported Standing balance-Leahy Scale: Poor                             ADL either performed or assessed with clinical judgement   ADL Overall ADL's :  Needs assistance/impaired Eating/Feeding: Set up;Supervision/ safety;Bed level   Grooming: Wash/dry face;Oral care;Standing;Min guard Grooming Details (indicate cue type and reason): Cues for positionig RW anterior to  sink for safety. Chair behind pt for safety. Pt expressed feeling weak in legs after 2 grooming tasks. Upper Body Bathing: Min guard;Sitting   Lower Body Bathing: Moderate assistance;Sit to/from stand;Sitting/lateral leans   Upper Body Dressing : Minimal assistance;Cueing for sequencing;Sitting   Lower Body Dressing: Moderate assistance Lower Body Dressing Details (indicate cue type and reason): Based on general assessment. Toilet Transfer: +2 for physical assistance;Minimal assistance Toilet Transfer Details (indicate cue type and reason): Pt stood from EOB to RW with Min As.  Pt ambulated to/from sink for grooming Min Assist of two, but with increased need to Moderate assist of 2 people  especially when returning to bed from sink due to fatigue. Pt with posterior bias with standing. Toileting- Clothing Manipulation and Hygiene: Moderate assistance Toileting - Clothing Manipulation Details (indicate cue type and reason): Based on general assessment.     Functional mobility during ADLs: Minimal assistance;Moderate assistance;Rolling walker (2 wheels);+2 for physical assistance       Vision Ability to See in Adequate Light: 1 Impaired Additional Comments: Pt reports that he has not been to an eye doctor for years and has no glasses.     Perception     Praxis      Pertinent Vitals/Pain Pain Assessment Pain Assessment: 0-10 Pain Score: 8  Breathing: normal Negative Vocalization: none Facial Expression: smiling or inexpressive Body Language: relaxed Consolability: no need to console PAINAD Score: 0 Pain Location: BLEs qually painful, chest and lower back. Pt reports that all pain is chronic. Pain Intervention(s): Limited activity within patient's tolerance, Monitored during session, Repositioned     Hand Dominance Right   Extremity/Trunk Assessment Upper Extremity Assessment Upper Extremity Assessment: RUE deficits/detail;LUE deficits/detail RUE Deficits / Details:  Generalized weakness. Increased pain with submaximal resistance equivalent to ~4-/5. RUE Sensation: decreased light touch RUE Coordination: decreased fine motor LUE Deficits / Details: same as RUE           Communication Communication Communication: No difficulties (Soft spoken)   Cognition Arousal/Alertness: Awake/alert Behavior During Therapy: WFL for tasks assessed/performed Overall Cognitive Status: Within Functional Limits for tasks assessed                                 General Comments: Pleasant and cooperative. Oriented grossly. When asked name of place pt responded, "It's not [place], right?" Partially oriented to situation.     General Comments       Exercises     Shoulder Instructions      Home Living Family/patient expects to be discharged to:: Skilled nursing facility (Currently without home. Came from motel but stated that he cannot return)                                 Additional Comments: had a RW and a cane, but doesn't know where it is now, has family in [place] but the      Prior Functioning/Environment Prior Level of Function : History of Falls (last six months)             Mobility Comments: several falls in past 6 months, did not have RW or cane with him  OT Problem List:        OT Treatment/Interventions: Self-care/ADL training;Therapeutic activities;Therapeutic exercise;Cognitive remediation/compensation;Energy conservation;Patient/family education;Balance training;DME and/or AE instruction    OT Goals(Current goals can be found in the care plan section) Acute Rehab OT Goals Patient Stated Goal: Find an apartment and be able to care for self. OT Goal Formulation: With patient Time For Goal Achievement: 03/22/23 Potential to Achieve Goals: Fair ADL Goals Pt Will Perform Grooming: with supervision;standing (Tolerating at least 3 grooming tasks with RPE <5/10 and at least fair standing  balance at sink,) Pt Will Perform Lower Body Bathing: with min guard assist;sit to/from stand;sitting/lateral leans Pt Will Perform Lower Body Dressing: with set-up;with supervision;with adaptive equipment;sitting/lateral leans;sit to/from stand Pt Will Transfer to Toilet: ambulating;with supervision;regular height toilet Pt Will Perform Toileting - Clothing Manipulation and hygiene: with supervision;sitting/lateral leans;sit to/from stand Pt/caregiver will Perform Home Exercise Program: Increased ROM;Increased strength;Both right and left upper extremity;With Supervision (Including postural exercises and stretching) Additional ADL Goal #1: Pt will demonstrate improved mentation by scoring <4/10 on short blessed test and answering 4/4 safety questions from the [facility] correctly:   1. What do you do for yourself if you are sick with a cold.    2. What do you do if you burn yourself and the wound becomes infected.    3. What do you do if you experience severe chest pain and shortness of breath?   4. What number do you call in an emergency?  OT Frequency: Min 1X/week    Co-evaluation PT/OT/SLP Co-Evaluation/Treatment: Yes Reason for Co-Treatment: To address functional/ADL transfers   OT goals addressed during session: ADL's and self-care      AM-PAC OT "6 Clicks" Daily Activity     Outcome Measure Help from another person eating meals?: A Little Help from another person taking care of personal grooming?: A Little Help from another person toileting, which includes using toliet, bedpan, or urinal?: A Lot Help from another person bathing (including washing, rinsing, drying)?: A Lot Help from another person to put on and taking off regular upper body clothing?: A Little Help from another person to put on and taking off regular lower body clothing?: A Lot 6 Click Score: 15   End of Session Equipment Utilized During Treatment: Gait belt;Rolling walker (2 wheels) Nurse Communication: Other (comment)  (Rn cleared OT to see)  Activity Tolerance: Patient tolerated treatment well Patient left: in bed;with call bell/phone within reach;with bed alarm set  OT Visit Diagnosis: Unsteadiness on feet (R26.81);Muscle weakness (generalized) (M62.81);Pain Pain - Right/Left:  (bilateral) Pain - part of body: Leg (as well as LBP and chest pain)                Time: 1610-9604 OT Time Calculation (min): 32 min Charges:  OT General Charges $OT Visit: 1 Visit OT Evaluation $OT Eval Low Complexity: 1 Low OT Treatments $Self Care/Home Management : 8-22 mins  Victorino Dike, OT Acute Rehab Services Office: [phone] 03/08/2023  Theodoro Clock 03/08/2023, 2:10 PM

## 2023-03-08 NOTE — Progress Notes (Signed)
PROGRESS NOTE    Michael [name]  WUJ:[id number] DOB: 11-Dec-1950 DOA: 03/07/2023 PCP: [name], MD    Chief Complaint  Patient presents with   Altered Mental Status    Brief Narrative:   Michael [name] is a 72 y.o. male with medical history significant of afib, CAD, stage 4 CKD, DM, HIV, HTN, and HLD presenting with AMS.   He was previously admitted from 5/10-15 for CAP.  He returned on 5/29 with LE edema, cough, and diffuse pain; he was started on BP medications and given Lasix for 5 days.  He was seen in the ER on 5/30 for "unstable living conditions" and cough with negative evaluation.  TTS evaluated him and he was cleared for dc.  He was monitored overnight to ensure safe discharge.  He returned 24 hours later with AMS.  He reports come confusion, problems with his kidneys, chest and diffuse pain.  He hasn't spoken with anyone about the possibility of HD for quite some time.       ER Course:  Homeless, non-compliant with medications.  Confused, but able to converse.  Nothing localizing.  ?uremia - recently admitted for PNA.  ?R-sided PNA, new vs. Old, no apparent symptoms, given broad-spectrum antibiotics.  Making urine.       Assessment & Plan:   Principal Problem:   Acute metabolic encephalopathy Active Problems:   Paroxysmal A-fib ([name])   HIV (human immunodeficiency virus infection) ([name])   Human immunodeficiency virus (HIV) disease ([name])   Polysubstance abuse ([name])   COPD (chronic obstructive pulmonary disease) ([name])   Hypertension goal BP (blood pressure) < 140/80   Substance induced mood disorder ([name])   Type II diabetes mellitus with renal manifestations ([name])   S/P CABG (coronary artery bypass graft)   CKD (chronic kidney disease), stage IV ([name])   Pure hypercholesterolemia   Acute toxic encephalopathy -Presents with confusion on presentation, this is most likely due to substance abuse, as he tested positive for cocaine and he endorsed smoking  crack cocaine. -As well.  There are some other complements like worsening uremia can be contributing  Advanced CKD -Progressively worsening creatinine on recent visits, baseline creatinine around 3, worsened to 5.4 on admission. -Renal input greatly appreciated, progressive kidney disease most likely in the setting of diabetes mellitus, and uncontrolled hypertension, HIV, likely exacerbated by poor adherence with his medications, due to his social situation. -Lactate elevated over 4 on admission -Renal input greatly appreciated, continue with current management trying to avoid hemodialysis as he is very poor candidate in the setting of polysubstance abuse and homelessness situation and known history of severe noncompliance -Recurrent lower extremity edema and evidence of volume overload, continue with IV Lasix 40 mg IV twice daily, monitor renal output closely, monitor electrolytes closely -Continue HCO3  Acute on chronic heart failure with preserved EF -Patient with significant volume overload, elevated BNP at 417, +2 lower extremity edema, 2D echo 12/2022 significant for a preserved EF -Continue with IV Lasix   HIV  -Noncompliant with home medications.   -Previously discussed with ID and he was recommended to resume home medications at discharge from last hospitalization -Continue Tivicay, Edurant   CAD -s/p CABG -Diffuse pain, mildly elevated troponin - will trend troponin but currently doubt ACS -Continue ASA, Lipitor   Cocaine dependence -Cessation encouraged; this should be encouraged on an ongoing basis -UDS ordered, positive for cocaine, he endorse smoking crack cocaine.   HTN  -Continue diltiazem   Afib  -Rate controlled  with diltiazem -Not a good candidate for anticoagulation due to homelessness and medication nonadherence   Diabetes  -A1c I 09/2022 was 6.8, reasonable control -Not on home medications -Cover with very sensitive-scale SSI    COPD -No apparent  exacerbation at this time -Continue albuterol, Breo   Mood d/o -Continue duloextine   Anemia -In the setting of chronic kidney disease  Acidosis -Continue with oral bicarb   Lower extremity edema/pain -Unclear etiology, venous Dopplers obtained, negative for DVT, obtain total CK to rule out rhabdo in the setting of cocaine abuse, was within normal limit, PT/OT consulted, recommendation for SNF placement  DVT prophylaxis: Heparin Code Status: Full Family Communication: none at bedside Disposition: Need SNF placement, not medically ready for discharge yet given renal function not back to baseline yet, on IV diuresis   Consultants:  Renal  Subjective:  Patient report generalized weakness, unsteady gait, reports pain in lower extremities  Objective: Vitals:   03/08/23 0755 03/08/23 1205 03/08/23 1206 03/08/23 1356  BP: (!) 174/88  (!) 166/92   Pulse: 73  78 84  Resp: 17  14   Temp: 98.3 F (36.8 C)  98.5 F (36.9 C)   TempSrc: Oral Oral Oral   SpO2: 99%  100% 98%  Weight:      Height:        Intake/Output Summary (Last 24 hours) at 03/08/2023 1431 Last data filed at 03/08/2023 1050 Gross per 24 hour  Intake 665.81 ml  Output 2950 ml  Net -2284.19 ml   Filed Weights   03/07/23 1110 03/07/23 1529  Weight: 74.4 kg 72.1 kg    Examination:  Awake Alert, Oriented X 3, frail, deconditioned Symmetrical Chest wall movement, Good air movement bilaterally, CTAB RRR,No Gallops,Rubs or new Murmurs, No Parasternal Heave +ve B.Sounds, Abd Soft, No tenderness, No rebound - guarding or rigidity. Lower extremity edema +2, limited range of motion due to pain.    Data Reviewed: I have personally reviewed following labs and imaging studies  CBC: Recent Labs  Lab 03/05/23 2004 03/07/23 1022 03/07/23 1029 03/07/23 1101 03/08/23 0224  WBC 5.2 5.5  --   --  4.7  NEUTROABS 3.7  --   --   --   --   HGB 9.3* 9.1* 9.5* 9.2* 8.4*  HCT 29.5* 29.2* 28.0* 27.0* 25.9*  MCV 89.4  89.8  --   --  88.7  PLT 139* 152  --   --  115*    Basic Metabolic Panel: Recent Labs  Lab 03/05/23 2004 03/07/23 1022 03/07/23 1029 03/07/23 1050 03/07/23 1101 03/08/23 0224 03/08/23 1135  NA 140 135 141  --  141 139 139  K 4.4 5.1 5.4*  --  5.4* 4.4 4.7  CL 111 108 112*  --   --  111 109  CO2 21* 13*  --   --   --  19* 21*  GLUCOSE 165* 213* 205*  --   --  107* 187*  BUN 60* 76* 84*  --   --  73* 74*  CREATININE 3.62* 5.04* 5.50*  --   --  4.79* 4.74*  CALCIUM 7.6* 8.0*  --   --   --  7.8* 7.9*  MG  --   --   --  1.8  --   --   --   PHOS  --   --   --  5.8*  --   --  4.5    GFR: Estimated Creatinine Clearance: 13.6 mL/min (A) (by  C-G formula based on SCr of 4.74 mg/dL (H)).  Liver Function Tests: Recent Labs  Lab 03/05/23 2004 03/07/23 1022 03/08/23 1135  AST 26 32  --   ALT 61* 51*  --   ALKPHOS 92 98  --   BILITOT 0.7 0.6  --   PROT 7.0 7.2  --   ALBUMIN 3.2* 3.3* 2.8*    CBG: Recent Labs  Lab 03/07/23 1015 03/07/23 1539 03/07/23 2032 03/08/23 0754 03/08/23 1202  GLUCAP 218* 163* 249* 98 166*     Recent Results (from the past 240 hour(s))  Blood culture (routine x 2)     Status: None (Preliminary result)   Collection Time: 03/07/23 12:16 PM   Specimen: BLOOD  Result Value Ref Range Status   Specimen Description BLOOD RIGHT ANTECUBITAL  Final   Special Requests   Final    BOTTLES DRAWN AEROBIC AND ANAEROBIC Blood Culture results may not be optimal due to an inadequate volume of blood received in culture bottles   Culture   Final    NO GROWTH < 24 HOURS Performed at [facility], [address] [place], [place] [address]    Report Status PENDING  Incomplete  Blood culture (routine x 2)     Status: None (Preliminary result)   Collection Time: 03/07/23 12:21 PM   Specimen: BLOOD RIGHT ARM  Result Value Ref Range Status   Specimen Description BLOOD RIGHT ARM  Final   Special Requests   Final    BOTTLES DRAWN AEROBIC ONLY Blood Culture  results may not be optimal due to an inadequate volume of blood received in culture bottles   Culture   Final    NO GROWTH < 24 HOURS Performed at [facility], [address] [place], [place] [address]    Report Status PENDING  Incomplete         Radiology Studies: VAS [place] LOWER EXTREMITY VENOUS (DVT)  Result Date: 03/08/2023  Lower Venous DVT Study Patient Name:  Michael [name]  Date of Exam:   03/08/2023 Medical Rec #: [id number]            Accession #:    [id number] Date of Birth: 1951/03/01            Patient Gender: M Patient Age:   50 years Exam Location:  [facility] Procedure:      VAS [place] LOWER EXTREMITY VENOUS (DVT) Referring Phys: Almond Fitzgibbon --------------------------------------------------------------------------------  Indications: Edema.  Comparison Study: Previous study 01/23/23 negative. Performing Technologist: McKayla Maag RVT, [place]  Examination Guidelines: A complete evaluation includes B-mode imaging, spectral Doppler, color Doppler, and power Doppler as needed of all accessible portions of each vessel. Bilateral testing is considered an integral part of a complete examination. Limited examinations for reoccurring indications may be performed as noted. The reflux portion of the exam is performed with the patient in reverse Trendelenburg.  +---------+---------------+---------+-----------+----------+--------------+ RIGHT    CompressibilityPhasicitySpontaneityPropertiesThrombus Aging +---------+---------------+---------+-----------+----------+--------------+ CFV      Full           Yes      Yes                                 +---------+---------------+---------+-----------+----------+--------------+ SFJ      Full                                                        +---------+---------------+---------+-----------+----------+--------------+  FV Prox  Full                                                         +---------+---------------+---------+-----------+----------+--------------+ FV Mid   Full                                                        +---------+---------------+---------+-----------+----------+--------------+ FV DistalFull                                                        +---------+---------------+---------+-----------+----------+--------------+ PFV      Full                                                        +---------+---------------+---------+-----------+----------+--------------+ POP      Full           Yes      Yes                                 +---------+---------------+---------+-----------+----------+--------------+ PTV      Full                                                        +---------+---------------+---------+-----------+----------+--------------+ PERO     Full                                                        +---------+---------------+---------+-----------+----------+--------------+   +---------+---------------+---------+-----------+----------+--------------+ LEFT     CompressibilityPhasicitySpontaneityPropertiesThrombus Aging +---------+---------------+---------+-----------+----------+--------------+ CFV      Full           Yes      Yes                                 +---------+---------------+---------+-----------+----------+--------------+ SFJ      Full                                                        +---------+---------------+---------+-----------+----------+--------------+ FV Prox  Full                                                        +---------+---------------+---------+-----------+----------+--------------+  FV Mid   Full                                                        +---------+---------------+---------+-----------+----------+--------------+ FV DistalFull                                                         +---------+---------------+---------+-----------+----------+--------------+ PFV      Full                                                        +---------+---------------+---------+-----------+----------+--------------+ POP      Full           Yes      Yes                                 +---------+---------------+---------+-----------+----------+--------------+ PTV      Full                                                        +---------+---------------+---------+-----------+----------+--------------+ PERO     Full                                                        +---------+---------------+---------+-----------+----------+--------------+     Summary: BILATERAL: - No evidence of deep vein thrombosis seen in the lower extremities, bilaterally. - No evidence of superficial venous thrombosis in the lower extremities, bilaterally. -No evidence of popliteal cyst, bilaterally.   *See table(s) above for measurements and observations.    Preliminary    CT CHEST ABDOMEN PELVIS WO CONTRAST  Result Date: 03/07/2023 CLINICAL DATA:  Diffuse pain, acute kidney injury, renal failure. EXAM: CT CHEST, ABDOMEN AND PELVIS WITHOUT CONTRAST TECHNIQUE: Multidetector CT imaging of the chest, abdomen and pelvis was performed following the standard protocol without IV contrast. RADIATION DOSE REDUCTION: This exam was performed according to the departmental dose-optimization program which includes automated exposure control, adjustment of the mA and/or kV according to patient size and/or use of iterative reconstruction technique. COMPARISON:  CT examination dated January 28, 2023 FINDINGS: CT CHEST FINDINGS Cardiovascular: Normal heart size. No pericardial effusion. Coronary artery bypass grafting. Mild aortic atherosclerotic calcifications. Mediastinum/Nodes: No enlarged mediastinal, hilar, or axillary lymph nodes. Thyroid gland, trachea, and esophagus demonstrate no significant findings. Lungs/Pleura:  Mild bronchial thickening and tree-in-bud opacities in the right lower lobe concerning for infectious/inflammatory process (series 3, image 40). No pleural effusion or pneumothorax. Musculoskeletal: No chest wall mass or suspicious bone lesions identified. Sternotomy wires are intact. Diffuse idiopathic skeletal hyperostosis. CT ABDOMEN PELVIS FINDINGS Hepatobiliary: No focal liver abnormality is seen. No  gallstones, gallbladder wall thickening, or biliary dilatation. Pancreas: Unremarkable. No pancreatic ductal dilatation or surrounding inflammatory changes. Spleen: Normal in size without focal abnormality. Adrenals/Urinary Tract: Adrenal glands are unremarkable. Kidneys are normal, without renal calculi, focal lesion, or hydronephrosis. Bladder is unremarkable. Stomach/Bowel: Stomach is within normal limits. Appendix not identified. No evidence of bowel wall thickening, distention, or inflammatory changes. Vascular/Lymphatic: Mild aortic atherosclerosis. No enlarged abdominal or pelvic lymph nodes. Reproductive: Postsurgical changes for prior TURP. Other: No abdominal wall hernia or abnormality. No abdominopelvic ascites. Musculoskeletal: No acute or significant osseous findings. Postsurgical changes at L5-S1 spinous processes. IMPRESSION: CT chest: 1. Mild bronchial thickening and tree-in-bud opacities in the right lower lobe concerning for infectious/inflammatory process. Clinical correlation and follow-up examination to resolution is recommended. 2. Evidence of prior coronary artery bypass grafting. CT abdomen/pelvis: 1. No CT evidence of acute abdominal/pelvic process. 2. Aortic atherosclerosis. 3. Postsurgical changes of prior TURP Aortic Atherosclerosis (ICD10-I70.0). Electronically Signed   By: Larose Hires D.O.   On: 03/07/2023 12:52   CT Head Wo Contrast  Result Date: 03/07/2023 CLINICAL DATA:  Altered mental status. Pain. EXAM: CT HEAD WITHOUT CONTRAST CT CERVICAL SPINE WITHOUT CONTRAST TECHNIQUE:  Multidetector CT imaging of the head and cervical spine was performed following the standard protocol without intravenous contrast. Multiplanar CT image reconstructions of the cervical spine were also generated. RADIATION DOSE REDUCTION: This exam was performed according to the departmental dose-optimization program which includes automated exposure control, adjustment of the mA and/or kV according to patient size and/or use of iterative reconstruction technique. COMPARISON:  CT head and cervical spine 11/12/2022. FINDINGS: CT HEAD FINDINGS Brain: No acute hemorrhage. Unchanged moderate to severe chronic small-vessel disease. Cortical gray-white differentiation is otherwise preserved. Prominence of the ventricles and sulci within expected range for age. No hydrocephalus or extra-axial collection. No mass effect or midline shift. 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 Vascular: No hyperdense vessel or unexpected calcification. Skull: No calvarial fracture or suspicious bone lesion. Skull base is unremarkable. Sinuses/Orbits: Unremarkable. Other: None. CT CERVICAL SPINE FINDINGS Alignment: Normal. Skull base and vertebrae: No acute fracture. Stable postoperative changes of C3-C7 ACDF. Hardware is intact. No associated lucency. Soft tissues and spinal canal: No prevertebral fluid or swelling. No visible canal hematoma. Disc levels:  No high-grade spinal canal stenosis. Upper chest: Unremarkable. Other: Atherosclerotic calcifications of the carotid bulbs. IMPRESSION: 1. No acute intracranial abnormality. Unchanged moderate to severe chronic small-vessel disease. 2. No acute fracture or traumatic listhesis of the cervical spine. Stable postoperative changes of C3-C7 ACDF. Electronically Signed   By: Orvan Falconer M.D.   On: 03/07/2023 12:30   CT Cervical Spine Wo Contrast  Result Date: 03/07/2023 CLINICAL DATA:  Altered mental status. Pain. EXAM: CT HEAD WITHOUT CONTRAST CT CERVICAL SPINE WITHOUT  CONTRAST TECHNIQUE: Multidetector CT imaging of the head and cervical spine was performed following the standard protocol without intravenous contrast. Multiplanar CT image reconstructions of the cervical spine were also generated. RADIATION DOSE REDUCTION: This exam was performed according to the departmental dose-optimization program which includes automated exposure control, adjustment of the mA and/or kV according to patient size and/or use of iterative reconstruction technique. COMPARISON:  CT head and cervical spine 11/12/2022. FINDINGS: CT HEAD FINDINGS Brain: No acute hemorrhage. Unchanged moderate to severe chronic small-vessel disease. Cortical gray-white differentiation is otherwise preserved. Prominence of the ventricles and sulci within expected range for age. No hydrocephalus or extra-axial collection. No mass effect or midline shift. Unchanged lipoma in the roof of  the third ventricle. Vascular: No hyperdense vessel or unexpected calcification. Skull: No calvarial fracture or suspicious bone lesion. Skull base is unremarkable. Sinuses/Orbits: Unremarkable. Other: None. CT CERVICAL SPINE FINDINGS Alignment: Normal. Skull base and vertebrae: No acute fracture. Stable postoperative changes of C3-C7 ACDF. Hardware is intact. No associated lucency. Soft tissues and spinal canal: No prevertebral fluid or swelling. No visible canal hematoma. Disc levels:  No high-grade spinal canal stenosis. Upper chest: Unremarkable. Other: Atherosclerotic calcifications of the carotid bulbs. IMPRESSION: 1. No acute intracranial abnormality. Unchanged moderate to severe chronic small-vessel disease. 2. No acute fracture or traumatic listhesis of the cervical spine. Stable postoperative changes of C3-C7 ACDF. Electronically Signed   By: Orvan Falconer M.D.   On: 03/07/2023 12:30        Scheduled Meds:  aspirin  81 mg Oral Daily   atorvastatin  40 mg Oral Daily   diltiazem  180 mg Oral Daily   dolutegravir  50 mg  Oral QAC lunch   DULoxetine  30 mg Oral Daily   fluticasone furoate-vilanterol  1 puff Inhalation Daily   furosemide  40 mg Intravenous Q12H   heparin  5,000 Units Subcutaneous Q8H   insulin aspart  0-6 Units Subcutaneous TID WC   polyethylene glycol  17 g Oral BID   rilpivirine  25 mg Oral Q breakfast   sodium bicarbonate  1,300 mg Oral TID   sodium chloride flush  3 mL Intravenous Q12H   Continuous Infusions:  ferric gluconate (FERRLECIT) IVPB 250 mg (03/08/23 1239)     LOS: 0 days      Huey Bienenstock, MD [facility]   To contact the attending provider between 7A-7P or the covering provider during after hours 7P-7A, please log into the web site [email or web address] and access using universal [place] password for that web site. If you do not have the password, please call the hospital operator.  03/08/2023, 2:31 PM

## 2023-03-09 DIAGNOSIS — B20 Human immunodeficiency virus [HIV] disease: Secondary | ICD-10-CM | POA: Diagnosis not present

## 2023-03-09 DIAGNOSIS — G9341 Metabolic encephalopathy: Secondary | ICD-10-CM | POA: Diagnosis not present

## 2023-03-09 DIAGNOSIS — F1910 Other psychoactive substance abuse, uncomplicated: Secondary | ICD-10-CM

## 2023-03-09 LAB — CBC
HCT: 25.4 % — ABNORMAL LOW (ref 39.0–52.0)
Hemoglobin: 8.2 g/dL — ABNORMAL LOW (ref 13.0–17.0)
MCH: 27.8 pg (ref 26.0–34.0)
MCHC: 32.3 g/dL (ref 30.0–36.0)
MCV: 86.1 fL (ref 80.0–100.0)
Platelets: 121 10*3/uL — ABNORMAL LOW (ref 150–400)
RBC: 2.95 MIL/uL — ABNORMAL LOW (ref 4.22–5.81)
RDW: 13.9 % (ref 11.5–15.5)
WBC: 4.3 10*3/uL (ref 4.0–10.5)
nRBC: 0 % (ref 0.0–0.2)

## 2023-03-09 LAB — GLUCOSE, CAPILLARY
Glucose-Capillary: 112 mg/dL — ABNORMAL HIGH (ref 70–99)
Glucose-Capillary: 139 mg/dL — ABNORMAL HIGH (ref 70–99)
Glucose-Capillary: 164 mg/dL — ABNORMAL HIGH (ref 70–99)
Glucose-Capillary: 99 mg/dL (ref 70–99)

## 2023-03-09 LAB — RENAL FUNCTION PANEL
Albumin: 2.6 g/dL — ABNORMAL LOW (ref 3.5–5.0)
Anion gap: 9 (ref 5–15)
BUN: 66 mg/dL — ABNORMAL HIGH (ref 8–23)
CO2: 22 mmol/L (ref 22–32)
Calcium: 7.6 mg/dL — ABNORMAL LOW (ref 8.9–10.3)
Chloride: 107 mmol/L (ref 98–111)
Creatinine, Ser: 4.81 mg/dL — ABNORMAL HIGH (ref 0.61–1.24)
GFR, Estimated: 12 mL/min — ABNORMAL LOW (ref >60)
Glucose, Bld: 131 mg/dL — ABNORMAL HIGH (ref 70–99)
Phosphorus: 3.9 mg/dL (ref 2.5–4.6)
Potassium: 4.1 mmol/L (ref 3.5–5.1)
Sodium: 138 mmol/L (ref 135–145)

## 2023-03-09 LAB — CULTURE, BLOOD (ROUTINE X 2)

## 2023-03-09 LAB — HIV-1 RNA QUANT-NO REFLEX-BLD
HIV 1 RNA Quant: 100 copies/mL
LOG10 HIV-1 RNA: 2 log10copy/mL

## 2023-03-09 MED ORDER — HYDRALAZINE HCL 25 MG PO TABS
25.0000 mg | ORAL_TABLET | Freq: Four times a day (QID) | ORAL | Status: DC
  Administered 2023-03-09 – 2023-03-10 (×4): 25 mg via ORAL
  Filled 2023-03-09 (×4): qty 1

## 2023-03-09 NOTE — Progress Notes (Signed)
Subjective:  3900 of urine-  kidney function pretty stable-  did well with breakfast, then c/o nausea ? Objective Vital signs in last 24 hours: Vitals:   03/09/23 0000 03/09/23 0437 03/09/23 0500 03/09/23 0749  BP: (!) 145/79 (!) 159/90  (!) 175/98  Pulse: 80 76  75  Resp: 13 11  15   Temp: 98.3 F (36.8 C) 98.2 F (36.8 C)  99.4 F (37.4 C)  TempSrc: Oral Oral  Oral  SpO2: 99% 99%  99%  Weight:   69.5 kg   Height:       Weight change: -4.9 kg  Intake/Output Summary (Last 24 hours) at 03/09/2023 1023 Last data filed at 03/09/2023 0751 Gross per 24 hour  Intake 240 ml  Output 3450 ml  Net -3210 ml    Assessment/Plan: 72 year old BM DM , HTN, progressive CKD since 2019-  now with more acute change in kidney function in the setting of better controlled BP after SBP 200 as well as cocaine use  1.Renal- progressive CKD for the last 5 years with proteinuria.  Likely due to DM, HIV or both exacerbated by poor social situation and non adherence.  There had been an acute change over 48 hours to 5/31-  some swings in BP and cocaine use-  had a lactate of over 4.  I hope that there will be some reversibility in the short term and that he wont require dialysis just yet.  We all know where this is going but dont want to commit him to dialysis too soon.  As an aside, dialysis would be extremely problematic in this guy-  I'm pretty sure he will not be complaint and continue to have the same behaviors.  It is really difficult to tell if he is uremic or not-  numbers better/stable so no action needed today  2. Hypertension/volume  - overloaded and hypertensive-  currently on lasix 40 iv q12 and cardizem 180 and prn hydralazine-  140-150 systolic in the sweet spot-  since made so much urine and volume status better will stop lasix-  hydralazine scheduled added on today -  he will likely not be able to keep up with that med 3. Acidosis-   oral bicarb 4. Hyperkalemia-    better 4. Anemia  - is present-   iron  stores--- sat 17 will dose with iron       Cecille Aver    Labs: Basic Metabolic Panel: Recent Labs  Lab 03/07/23 1050 03/07/23 1101 03/08/23 0224 03/08/23 1135 03/09/23 0326  NA  --    < > 139 139 138  K  --    < > 4.4 4.7 4.1  CL  --   --  111 109 107  CO2  --   --  19* 21* 22  GLUCOSE  --   --  107* 187* 131*  BUN  --   --  73* 74* 66*  CREATININE  --   --  4.79* 4.74* 4.81*  CALCIUM  --   --  7.8* 7.9* 7.6*  PHOS 5.8*  --   --  4.5 3.9   < > = values in this interval not displayed.   Liver Function Tests: Recent Labs  Lab 03/05/23 2004 03/07/23 1022 03/08/23 1135 03/09/23 0326  AST 26 32  --   --   ALT 61* 51*  --   --   ALKPHOS 92 98  --   --   BILITOT 0.7 0.6  --   --  PROT 7.0 7.2  --   --   ALBUMIN 3.2* 3.3* 2.8* 2.6*   No results for input(s): "LIPASE", "AMYLASE" in the last 168 hours. No results for input(s): "AMMONIA" in the last 168 hours. CBC: Recent Labs  Lab 03/05/23 2004 03/07/23 1022 03/07/23 1029 03/07/23 1101 03/08/23 0224 03/09/23 0326  WBC 5.2 5.5  --   --  4.7 4.3  NEUTROABS 3.7  --   --   --   --   --   HGB 9.3* 9.1*   < > 9.2* 8.4* 8.2*  HCT 29.5* 29.2*   < > 27.0* 25.9* 25.4*  MCV 89.4 89.8  --   --  88.7 86.1  PLT 139* 152  --   --  115* 121*   < > = values in this interval not displayed.   Cardiac Enzymes: Recent Labs  Lab 03/08/23 0224  CKTOTAL 260   CBG: Recent Labs  Lab 03/08/23 0754 03/08/23 1202 03/08/23 1540 03/08/23 2033 03/09/23 0746  GLUCAP 98 166* 147* 192* 112*    Iron Studies:  Recent Labs    03/08/23 0224  IRON 42*  TIBC 255  FERRITIN 422*   Studies/Results: VAS [place] LOWER EXTREMITY VENOUS (DVT)  Result Date: 03/08/2023  Lower Venous DVT Study Patient Name:  Michael [name]  Date of Exam:   03/08/2023 Medical Rec #: [id number]            Accession #:    [id number] Date of Birth: 1951/08/09            Patient Gender: M Patient Age:   39 years Exam Location:  [facility] [place]  Procedure:      VAS [place] LOWER EXTREMITY VENOUS (DVT) Referring Phys: Huey Bienenstock --------------------------------------------------------------------------------  Indications: Edema.  Comparison Study: Previous study 01/23/23 negative. Performing Technologist: McKayla Maag RVT, [place]  Examination Guidelines: A complete evaluation includes B-mode imaging, spectral Doppler, color Doppler, and power Doppler as needed of all accessible portions of each vessel. Bilateral testing is considered an integral part of a complete examination. Limited examinations for reoccurring indications may be performed as noted. The reflux portion of the exam is performed with the patient in reverse Trendelenburg.  +---------+---------------+---------+-----------+----------+--------------+ RIGHT    CompressibilityPhasicitySpontaneityPropertiesThrombus Aging +---------+---------------+---------+-----------+----------+--------------+ CFV      Full           Yes      Yes                                 +---------+---------------+---------+-----------+----------+--------------+ SFJ      Full                                                        +---------+---------------+---------+-----------+----------+--------------+ FV Prox  Full                                                        +---------+---------------+---------+-----------+----------+--------------+ FV Mid   Full                                                        +---------+---------------+---------+-----------+----------+--------------+  FV DistalFull                                                        +---------+---------------+---------+-----------+----------+--------------+ PFV      Full                                                        +---------+---------------+---------+-----------+----------+--------------+ POP      Full           Yes      Yes                                  +---------+---------------+---------+-----------+----------+--------------+ PTV      Full                                                        +---------+---------------+---------+-----------+----------+--------------+ PERO     Full                                                        +---------+---------------+---------+-----------+----------+--------------+   +---------+---------------+---------+-----------+----------+--------------+ LEFT     CompressibilityPhasicitySpontaneityPropertiesThrombus Aging +---------+---------------+---------+-----------+----------+--------------+ CFV      Full           Yes      Yes                                 +---------+---------------+---------+-----------+----------+--------------+ SFJ      Full                                                        +---------+---------------+---------+-----------+----------+--------------+ FV Prox  Full                                                        +---------+---------------+---------+-----------+----------+--------------+ FV Mid   Full                                                        +---------+---------------+---------+-----------+----------+--------------+ FV DistalFull                                                        +---------+---------------+---------+-----------+----------+--------------+  PFV      Full                                                        +---------+---------------+---------+-----------+----------+--------------+ POP      Full           Yes      Yes                                 +---------+---------------+---------+-----------+----------+--------------+ PTV      Full                                                        +---------+---------------+---------+-----------+----------+--------------+ PERO     Full                                                         +---------+---------------+---------+-----------+----------+--------------+     Summary: BILATERAL: - No evidence of deep vein thrombosis seen in the lower extremities, bilaterally. - No evidence of superficial venous thrombosis in the lower extremities, bilaterally. -No evidence of popliteal cyst, bilaterally.   *See table(s) above for measurements and observations. Electronically signed by Sherald Hess MD on 03/08/2023 at 2:35:56 PM.    Final    CT CHEST ABDOMEN PELVIS WO CONTRAST  Result Date: 03/07/2023 CLINICAL DATA:  Diffuse pain, acute kidney injury, renal failure. EXAM: CT CHEST, ABDOMEN AND PELVIS WITHOUT CONTRAST TECHNIQUE: Multidetector CT imaging of the chest, abdomen and pelvis was performed following the standard protocol without IV contrast. RADIATION DOSE REDUCTION: This exam was performed according to the departmental dose-optimization program which includes automated exposure control, adjustment of the mA and/or kV according to patient size and/or use of iterative reconstruction technique. COMPARISON:  CT examination dated January 28, 2023 FINDINGS: CT CHEST FINDINGS Cardiovascular: Normal heart size. No pericardial effusion. Coronary artery bypass grafting. Mild aortic atherosclerotic calcifications. Mediastinum/Nodes: No enlarged mediastinal, hilar, or axillary lymph nodes. Thyroid gland, trachea, and esophagus demonstrate no significant findings. Lungs/Pleura: Mild bronchial thickening and tree-in-bud opacities in the right lower lobe concerning for infectious/inflammatory process (series 3, image 40). No pleural effusion or pneumothorax. Musculoskeletal: No chest wall mass or suspicious bone lesions identified. Sternotomy wires are intact. Diffuse idiopathic skeletal hyperostosis. CT ABDOMEN PELVIS FINDINGS Hepatobiliary: No focal liver abnormality is seen. No gallstones, gallbladder wall thickening, or biliary dilatation. Pancreas: Unremarkable. No pancreatic ductal dilatation or  surrounding inflammatory changes. Spleen: Normal in size without focal abnormality. Adrenals/Urinary Tract: Adrenal glands are unremarkable. Kidneys are normal, without renal calculi, focal lesion, or hydronephrosis. Bladder is unremarkable. Stomach/Bowel: Stomach is within normal limits. Appendix not identified. No evidence of bowel wall thickening, distention, or inflammatory changes. Vascular/Lymphatic: Mild aortic atherosclerosis. No enlarged abdominal or pelvic lymph nodes. Reproductive: Postsurgical changes for prior TURP. Other: No abdominal wall hernia or abnormality. No abdominopelvic ascites. Musculoskeletal: No acute or significant osseous findings. Postsurgical changes at L5-S1 spinous processes. IMPRESSION: CT chest: 1. Mild bronchial  thickening and tree-in-bud opacities in the right lower lobe concerning for infectious/inflammatory process. Clinical correlation and follow-up examination to resolution is recommended. 2. Evidence of prior coronary artery bypass grafting. CT abdomen/pelvis: 1. No CT evidence of acute abdominal/pelvic process. 2. Aortic atherosclerosis. 3. Postsurgical changes of prior TURP Aortic Atherosclerosis (ICD10-I70.0). Electronically Signed   By: Larose Hires D.O.   On: 03/07/2023 12:52   CT Head Wo Contrast  Result Date: 03/07/2023 CLINICAL DATA:  Altered mental status. Pain. EXAM: CT HEAD WITHOUT CONTRAST CT CERVICAL SPINE WITHOUT CONTRAST TECHNIQUE: Multidetector CT imaging of the head and cervical spine was performed following the standard protocol without intravenous contrast. Multiplanar CT image reconstructions of the cervical spine were also generated. RADIATION DOSE REDUCTION: This exam was performed according to the departmental dose-optimization program which includes automated exposure control, adjustment of the mA and/or kV according to patient size and/or use of iterative reconstruction technique. COMPARISON:  CT head and cervical spine 11/12/2022. FINDINGS: CT  HEAD FINDINGS Brain: No acute hemorrhage. Unchanged moderate to severe chronic small-vessel disease. Cortical gray-white differentiation is otherwise preserved. Prominence of the ventricles and sulci within expected range for age. No hydrocephalus or extra-axial collection. No mass effect or midline shift. 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 Vascular: No hyperdense vessel or unexpected calcification. Skull: No calvarial fracture or suspicious bone lesion. Skull base is unremarkable. Sinuses/Orbits: Unremarkable. Other: None. CT CERVICAL SPINE FINDINGS Alignment: Normal. Skull base and vertebrae: No acute fracture. Stable postoperative changes of C3-C7 ACDF. Hardware is intact. No associated lucency. Soft tissues and spinal canal: No prevertebral fluid or swelling. No visible canal hematoma. Disc levels:  No high-grade spinal canal stenosis. Upper chest: Unremarkable. Other: Atherosclerotic calcifications of the carotid bulbs. IMPRESSION: 1. No acute intracranial abnormality. Unchanged moderate to severe chronic small-vessel disease. 2. No acute fracture or traumatic listhesis of the cervical spine. Stable postoperative changes of C3-C7 ACDF. Electronically Signed   By: Orvan Falconer M.D.   On: 03/07/2023 12:30   CT Cervical Spine Wo Contrast  Result Date: 03/07/2023 CLINICAL DATA:  Altered mental status. Pain. EXAM: CT HEAD WITHOUT CONTRAST CT CERVICAL SPINE WITHOUT CONTRAST TECHNIQUE: Multidetector CT imaging of the head and cervical spine was performed following the standard protocol without intravenous contrast. Multiplanar CT image reconstructions of the cervical spine were also generated. RADIATION DOSE REDUCTION: This exam was performed according to the departmental dose-optimization program which includes automated exposure control, adjustment of the mA and/or kV according to patient size and/or use of iterative reconstruction technique. COMPARISON:  CT head and cervical spine  11/12/2022. FINDINGS: CT HEAD FINDINGS Brain: No acute hemorrhage. Unchanged moderate to severe chronic small-vessel disease. Cortical gray-white differentiation is otherwise preserved. Prominence of the ventricles and sulci within expected range for age. No hydrocephalus or extra-axial collection. No mass effect or midline shift. 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 Vascular: No hyperdense vessel or unexpected calcification. Skull: No calvarial fracture or suspicious bone lesion. Skull base is unremarkable. Sinuses/Orbits: Unremarkable. Other: None. CT CERVICAL SPINE FINDINGS Alignment: Normal. Skull base and vertebrae: No acute fracture. Stable postoperative changes of C3-C7 ACDF. Hardware is intact. No associated lucency. Soft tissues and spinal canal: No prevertebral fluid or swelling. No visible canal hematoma. Disc levels:  No high-grade spinal canal stenosis. Upper chest: Unremarkable. Other: Atherosclerotic calcifications of the carotid bulbs. IMPRESSION: 1. No acute intracranial abnormality. Unchanged moderate to severe chronic small-vessel disease. 2. No acute fracture or traumatic listhesis of the cervical spine. Stable  postoperative changes of C3-C7 ACDF. Electronically Signed   By: Orvan Falconer M.D.   On: 03/07/2023 12:30   Medications: Infusions:  ferric gluconate (FERRLECIT) IVPB 250 mg (03/08/23 1239)    Scheduled Medications:  aspirin  81 mg Oral Daily   atorvastatin  40 mg Oral Daily   diltiazem  180 mg Oral Daily   dolutegravir  50 mg Oral QAC lunch   DULoxetine  30 mg Oral Daily   fluticasone furoate-vilanterol  1 puff Inhalation Daily   furosemide  40 mg Intravenous Q12H   heparin  5,000 Units Subcutaneous Q8H   hydrALAZINE  25 mg Oral Q6H   insulin aspart  0-6 Units Subcutaneous TID WC   polyethylene glycol  17 g Oral BID   rilpivirine  25 mg Oral Q breakfast   sodium bicarbonate  1,300 mg Oral TID   sodium chloride flush  3 mL Intravenous Q12H    have  reviewed scheduled and prn medications.  Physical Exam: General: smiling NAD Heart: RRR Lungs: dec BS at bases Abdomen: soft, non tender Extremities: no edema  Dialysis Access: none     03/09/2023,10:23 AM  LOS: 1 day

## 2023-03-09 NOTE — Progress Notes (Signed)
PROGRESS NOTE    Michael [name]  ZOX:[id number] DOB: 10-Sep-1951 DOA: 03/07/2023 PCP: [name], MD    Chief Complaint  Patient presents with   Altered Mental Status    Brief Narrative:   Michael [name] is a 72 y.o. male with medical history significant of afib, CAD, stage 4 CKD, DM, HIV, HTN, and HLD presenting with AMS.   He was previously admitted from 5/10-15 for CAP.  He returned on 5/29 with LE edema, cough, and diffuse pain; he was started on BP medications and given Lasix for 5 days.  He was seen in the ER on 5/30 for "unstable living conditions" and cough with negative evaluation.  TTS evaluated him and he was cleared for dc.  He was monitored overnight to ensure safe discharge.  He returned 24 hours later with AMS.  He reports come confusion, problems with his kidneys, chest and diffuse pain.  His urine drug screen came back positive for cocaine then as well.   Assessment & Plan:   Principal Problem:   Acute metabolic encephalopathy Active Problems:   Paroxysmal A-fib ([name])   HIV (human immunodeficiency virus infection) ([name])   Human immunodeficiency virus (HIV) disease ([name])   Polysubstance abuse ([name])   COPD (chronic obstructive pulmonary disease) ([name])   Hypertension goal BP (blood pressure) < 140/80   Substance induced mood disorder ([name])   Type II diabetes mellitus with renal manifestations ([name])   S/P CABG (coronary artery bypass graft)   CKD (chronic kidney disease), stage IV ([name])   Pure hypercholesterolemia   Acute toxic encephalopathy -Presents with confusion on presentation, this is most likely due to substance abuse, as he tested positive for cocaine and he endorsed smoking crack cocaine. -As well.  There are some other complements like worsening uremia can be contributing  Advanced CKD -Progressively worsening creatinine on recent visits, baseline creatinine around 3, worsened to 5.4 on admission.  Improving initially, but plateaued over  the last 24 hours, most recent creatinine 4.8. -Renal input greatly appreciated, progressive kidney disease most likely in the setting of diabetes mellitus, and uncontrolled hypertension, HIV, likely exacerbated by poor adherence with his medications, due to his social situation. -Lactate elevated over 4 on admission -Renal input greatly appreciated, continue with current management trying to avoid hemodialysis as he is very poor candidate in the setting of polysubstance abuse and homelessness situation and known history of severe noncompliance -Lower extremity edema much improved, remains on IV Lasix 40 mg IV twice daily, management per renal.   -Continue HCO3  Acute on chronic heart failure with preserved EF -Patient with significant volume overload, elevated BNP at 417, +2 lower extremity edema, 2D echo 12/2022 significant for a preserved EF -Continue with IV Lasix   HIV  -Noncompliant with home medications.   -Previously discussed with ID and he was recommended to resume home medications at discharge from last hospitalization -Continue Tivicay, Edurant   CAD -s/p CABG -Diffuse pain, mildly elevated troponin - will trend troponin but currently doubt ACS -Continue ASA, Lipitor   Cocaine dependence -Cessation encouraged; this should be encouraged on an ongoing basis -UDS ordered, positive for cocaine, he endorse smoking crack cocaine.   HTN  -Continue diltiazem   Afib  -Rate controlled with diltiazem -Not a good candidate for anticoagulation due to homelessness and medication nonadherence   Diabetes  -A1c I 09/2022 was 6.8, reasonable control -Not on home medications -Cover with very sensitive-scale SSI    COPD -No apparent exacerbation at  this time -Continue albuterol, Breo   Mood d/o -Continue duloextine   Anemia -In the setting of chronic kidney disease  Acidosis -Continue with oral bicarb   Lower extremity edema/pain -Unclear etiology, venous Dopplers obtained,  negative for DVT, obtain total CK to rule out rhabdo in the setting of cocaine abuse, was within normal limit, PT/OT consulted, recommendation for SNF placement  DVT prophylaxis: Heparin Code Status: Full Family Communication: none at bedside Disposition: Need SNF placement, not medically ready for discharge yet given renal function not back to baseline yet, on IV diuresis   Consultants:  Renal  Subjective:  No significant events overnight as discussed with staff, he denies chest pain or shortness of breath, he still reports generalized weakness and lower extremity aching.  Objective: Vitals:   03/09/23 0000 03/09/23 0437 03/09/23 0500 03/09/23 0749  BP: (!) 145/79 (!) 159/90  (!) 175/98  Pulse: 80 76  75  Resp: 13 11  15   Temp: 98.3 F (36.8 C) 98.2 F (36.8 C)  99.4 F (37.4 C)  TempSrc: Oral Oral  Oral  SpO2: 99% 99%  99%  Weight:   69.5 kg   Height:        Intake/Output Summary (Last 24 hours) at 03/09/2023 1005 Last data filed at 03/09/2023 0751 Gross per 24 hour  Intake 240 ml  Output 3450 ml  Net -3210 ml   Filed Weights   03/07/23 1110 03/07/23 1529 03/09/23 0500  Weight: 74.4 kg 72.1 kg 69.5 kg    Examination:  Awake Alert, Oriented X 3, No new F.N deficits, Normal affect Symmetrical Chest wall movement, Good air movement bilaterally, CTAB RRR,No Gallops,Rubs or new Murmurs, No Parasternal Heave +ve B.Sounds, Abd Soft, No tenderness, No rebound - guarding or rigidity. No Cyanosis, Clubbing, lower extremity edema much improved     Data Reviewed: I have personally reviewed following labs and imaging studies  CBC: Recent Labs  Lab 03/05/23 2004 03/07/23 1022 03/07/23 1029 03/07/23 1101 03/08/23 0224 03/09/23 0326  WBC 5.2 5.5  --   --  4.7 4.3  NEUTROABS 3.7  --   --   --   --   --   HGB 9.3* 9.1* 9.5* 9.2* 8.4* 8.2*  HCT 29.5* 29.2* 28.0* 27.0* 25.9* 25.4*  MCV 89.4 89.8  --   --  88.7 86.1  PLT 139* 152  --   --  115* 121*    Basic Metabolic  Panel: Recent Labs  Lab 03/05/23 2004 03/07/23 1022 03/07/23 1029 03/07/23 1050 03/07/23 1101 03/08/23 0224 03/08/23 1135 03/09/23 0326  NA 140 135 141  --  141 139 139 138  K 4.4 5.1 5.4*  --  5.4* 4.4 4.7 4.1  CL 111 108 112*  --   --  111 109 107  CO2 21* 13*  --   --   --  19* 21* 22  GLUCOSE 165* 213* 205*  --   --  107* 187* 131*  BUN 60* 76* 84*  --   --  73* 74* 66*  CREATININE 3.62* 5.04* 5.50*  --   --  4.79* 4.74* 4.81*  CALCIUM 7.6* 8.0*  --   --   --  7.8* 7.9* 7.6*  MG  --   --   --  1.8  --   --   --   --   PHOS  --   --   --  5.8*  --   --  4.5 3.9    GFR:  Estimated Creatinine Clearance: 13.4 mL/min (A) (by C-G formula based on SCr of 4.81 mg/dL (H)).  Liver Function Tests: Recent Labs  Lab 03/05/23 2004 03/07/23 1022 03/08/23 1135 03/09/23 0326  AST 26 32  --   --   ALT 61* 51*  --   --   ALKPHOS 92 98  --   --   BILITOT 0.7 0.6  --   --   PROT 7.0 7.2  --   --   ALBUMIN 3.2* 3.3* 2.8* 2.6*    CBG: Recent Labs  Lab 03/08/23 0754 03/08/23 1202 03/08/23 1540 03/08/23 2033 03/09/23 0746  GLUCAP 98 166* 147* 192* 112*     Recent Results (from the past 240 hour(s))  Blood culture (routine x 2)     Status: None (Preliminary result)   Collection Time: 03/07/23 12:16 PM   Specimen: BLOOD  Result Value Ref Range Status   Specimen Description BLOOD RIGHT ANTECUBITAL  Final   Special Requests   Final    BOTTLES DRAWN AEROBIC AND ANAEROBIC Blood Culture results may not be optimal due to an inadequate volume of blood received in culture bottles   Culture   Final    NO GROWTH < 24 HOURS Performed at [facility], [address], [place], [place] [address]    Report Status PENDING  Incomplete  Blood culture (routine x 2)     Status: None (Preliminary result)   Collection Time: 03/07/23 12:21 PM   Specimen: BLOOD RIGHT ARM  Result Value Ref Range Status   Specimen Description BLOOD RIGHT ARM  Final   Special Requests   Final    BOTTLES  DRAWN AEROBIC ONLY Blood Culture results may not be optimal due to an inadequate volume of blood received in culture bottles   Culture   Final    NO GROWTH < 24 HOURS Performed at [facility], [address], [place], [place] [address]    Report Status PENDING  Incomplete         Radiology Studies: VAS [place] LOWER EXTREMITY VENOUS (DVT)  Result Date: 03/08/2023  Lower Venous DVT Study Patient Name:  BALDEMAR WOLFROM  Date of Exam:   03/08/2023 Medical Rec #: [id number]            Accession #:    [id number] Date of Birth: 1951-09-22            Patient Gender: M Patient Age:   26 years Exam Location:  [facility] Procedure:      VAS [place] LOWER EXTREMITY VENOUS (DVT) Referring Phys: [place] Zaragoza --------------------------------------------------------------------------------  Indications: Edema.  Comparison Study: Previous study 01/23/23 negative. Performing Technologist: McKayla Maag RVT, [place]  Examination Guidelines: A complete evaluation includes B-mode imaging, spectral Doppler, color Doppler, and power Doppler as needed of all accessible portions of each vessel. Bilateral testing is considered an integral part of a complete examination. Limited examinations for reoccurring indications may be performed as noted. The reflux portion of the exam is performed with the patient in reverse Trendelenburg.  +---------+---------------+---------+-----------+----------+--------------+ RIGHT    CompressibilityPhasicitySpontaneityPropertiesThrombus Aging +---------+---------------+---------+-----------+----------+--------------+ CFV      Full           Yes      Yes                                 +---------+---------------+---------+-----------+----------+--------------+ SFJ      Full                                                        +---------+---------------+---------+-----------+----------+--------------+  FV Prox  Full                                                         +---------+---------------+---------+-----------+----------+--------------+ FV Mid   Full                                                        +---------+---------------+---------+-----------+----------+--------------+ FV DistalFull                                                        +---------+---------------+---------+-----------+----------+--------------+ PFV      Full                                                        +---------+---------------+---------+-----------+----------+--------------+ POP      Full           Yes      Yes                                 +---------+---------------+---------+-----------+----------+--------------+ PTV      Full                                                        +---------+---------------+---------+-----------+----------+--------------+ PERO     Full                                                        +---------+---------------+---------+-----------+----------+--------------+   +---------+---------------+---------+-----------+----------+--------------+ LEFT     CompressibilityPhasicitySpontaneityPropertiesThrombus Aging +---------+---------------+---------+-----------+----------+--------------+ CFV      Full           Yes      Yes                                 +---------+---------------+---------+-----------+----------+--------------+ SFJ      Full                                                        +---------+---------------+---------+-----------+----------+--------------+ FV Prox  Full                                                        +---------+---------------+---------+-----------+----------+--------------+  FV Mid   Full                                                        +---------+---------------+---------+-----------+----------+--------------+ FV DistalFull                                                         +---------+---------------+---------+-----------+----------+--------------+ PFV      Full                                                        +---------+---------------+---------+-----------+----------+--------------+ POP      Full           Yes      Yes                                 +---------+---------------+---------+-----------+----------+--------------+ PTV      Full                                                        +---------+---------------+---------+-----------+----------+--------------+ PERO     Full                                                        +---------+---------------+---------+-----------+----------+--------------+     Summary: BILATERAL: - No evidence of deep vein thrombosis seen in the lower extremities, bilaterally. - No evidence of superficial venous thrombosis in the lower extremities, bilaterally. -No evidence of popliteal cyst, bilaterally.   *See table(s) above for measurements and observations. Electronically signed by Sherald Hess MD on 03/08/2023 at 2:35:56 PM.    Final    CT CHEST ABDOMEN PELVIS WO CONTRAST  Result Date: 03/07/2023 CLINICAL DATA:  Diffuse pain, acute kidney injury, renal failure. EXAM: CT CHEST, ABDOMEN AND PELVIS WITHOUT CONTRAST TECHNIQUE: Multidetector CT imaging of the chest, abdomen and pelvis was performed following the standard protocol without IV contrast. RADIATION DOSE REDUCTION: This exam was performed according to the departmental dose-optimization program which includes automated exposure control, adjustment of the mA and/or kV according to patient size and/or use of iterative reconstruction technique. COMPARISON:  CT examination dated January 28, 2023 FINDINGS: CT CHEST FINDINGS Cardiovascular: Normal heart size. No pericardial effusion. Coronary artery bypass grafting. Mild aortic atherosclerotic calcifications. Mediastinum/Nodes: No enlarged mediastinal, hilar, or axillary lymph nodes. Thyroid gland,  trachea, and esophagus demonstrate no significant findings. Lungs/Pleura: Mild bronchial thickening and tree-in-bud opacities in the right lower lobe concerning for infectious/inflammatory process (series 3, image 40). No pleural effusion or pneumothorax. Musculoskeletal: No chest wall mass or suspicious bone lesions identified. Sternotomy wires are intact. Diffuse idiopathic skeletal hyperostosis. CT  ABDOMEN PELVIS FINDINGS Hepatobiliary: No focal liver abnormality is seen. No gallstones, gallbladder wall thickening, or biliary dilatation. Pancreas: Unremarkable. No pancreatic ductal dilatation or surrounding inflammatory changes. Spleen: Normal in size without focal abnormality. Adrenals/Urinary Tract: Adrenal glands are unremarkable. Kidneys are normal, without renal calculi, focal lesion, or hydronephrosis. Bladder is unremarkable. Stomach/Bowel: Stomach is within normal limits. Appendix not identified. No evidence of bowel wall thickening, distention, or inflammatory changes. Vascular/Lymphatic: Mild aortic atherosclerosis. No enlarged abdominal or pelvic lymph nodes. Reproductive: Postsurgical changes for prior TURP. Other: No abdominal wall hernia or abnormality. No abdominopelvic ascites. Musculoskeletal: No acute or significant osseous findings. Postsurgical changes at L5-S1 spinous processes. IMPRESSION: CT chest: 1. Mild bronchial thickening and tree-in-bud opacities in the right lower lobe concerning for infectious/inflammatory process. Clinical correlation and follow-up examination to resolution is recommended. 2. Evidence of prior coronary artery bypass grafting. CT abdomen/pelvis: 1. No CT evidence of acute abdominal/pelvic process. 2. Aortic atherosclerosis. 3. Postsurgical changes of prior TURP Aortic Atherosclerosis (ICD10-I70.0). Electronically Signed   By: Larose Hires D.O.   On: 03/07/2023 12:52   CT Head Wo Contrast  Result Date: 03/07/2023 CLINICAL DATA:  Altered mental status. Pain. EXAM:  CT HEAD WITHOUT CONTRAST CT CERVICAL SPINE WITHOUT CONTRAST TECHNIQUE: Multidetector CT imaging of the head and cervical spine was performed following the standard protocol without intravenous contrast. Multiplanar CT image reconstructions of the cervical spine were also generated. RADIATION DOSE REDUCTION: This exam was performed according to the departmental dose-optimization program which includes automated exposure control, adjustment of the mA and/or kV according to patient size and/or use of iterative reconstruction technique. COMPARISON:  CT head and cervical spine 11/12/2022. FINDINGS: CT HEAD FINDINGS Brain: No acute hemorrhage. Unchanged moderate to severe chronic small-vessel disease. Cortical gray-white differentiation is otherwise preserved. Prominence of the ventricles and sulci within expected range for age. No hydrocephalus or extra-axial collection. No mass effect or midline shift. 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 Vascular: No hyperdense vessel or unexpected calcification. Skull: No calvarial fracture or suspicious bone lesion. Skull base is unremarkable. Sinuses/Orbits: Unremarkable. Other: None. CT CERVICAL SPINE FINDINGS Alignment: Normal. Skull base and vertebrae: No acute fracture. Stable postoperative changes of C3-C7 ACDF. Hardware is intact. No associated lucency. Soft tissues and spinal canal: No prevertebral fluid or swelling. No visible canal hematoma. Disc levels:  No high-grade spinal canal stenosis. Upper chest: Unremarkable. Other: Atherosclerotic calcifications of the carotid bulbs. IMPRESSION: 1. No acute intracranial abnormality. Unchanged moderate to severe chronic small-vessel disease. 2. No acute fracture or traumatic listhesis of the cervical spine. Stable postoperative changes of C3-C7 ACDF. Electronically Signed   By: Orvan Falconer M.D.   On: 03/07/2023 12:30   CT Cervical Spine Wo Contrast  Result Date: 03/07/2023 CLINICAL DATA:  Altered mental  status. Pain. EXAM: CT HEAD WITHOUT CONTRAST CT CERVICAL SPINE WITHOUT CONTRAST TECHNIQUE: Multidetector CT imaging of the head and cervical spine was performed following the standard protocol without intravenous contrast. Multiplanar CT image reconstructions of the cervical spine were also generated. RADIATION DOSE REDUCTION: This exam was performed according to the departmental dose-optimization program which includes automated exposure control, adjustment of the mA and/or kV according to patient size and/or use of iterative reconstruction technique. COMPARISON:  CT head and cervical spine 11/12/2022. FINDINGS: CT HEAD FINDINGS Brain: No acute hemorrhage. Unchanged moderate to severe chronic small-vessel disease. Cortical gray-white differentiation is otherwise preserved. Prominence of the ventricles and sulci within expected range for age. No hydrocephalus or extra-axial collection. No  mass effect or midline shift. 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 Vascular: No hyperdense vessel or unexpected calcification. Skull: No calvarial fracture or suspicious bone lesion. Skull base is unremarkable. Sinuses/Orbits: Unremarkable. Other: None. CT CERVICAL SPINE FINDINGS Alignment: Normal. Skull base and vertebrae: No acute fracture. Stable postoperative changes of C3-C7 ACDF. Hardware is intact. No associated lucency. Soft tissues and spinal canal: No prevertebral fluid or swelling. No visible canal hematoma. Disc levels:  No high-grade spinal canal stenosis. Upper chest: Unremarkable. Other: Atherosclerotic calcifications of the carotid bulbs. IMPRESSION: 1. No acute intracranial abnormality. Unchanged moderate to severe chronic small-vessel disease. 2. No acute fracture or traumatic listhesis of the cervical spine. Stable postoperative changes of C3-C7 ACDF. Electronically Signed   By: Orvan Falconer M.D.   On: 03/07/2023 12:30        Scheduled Meds:  aspirin  81 mg Oral Daily   atorvastatin  40  mg Oral Daily   diltiazem  180 mg Oral Daily   dolutegravir  50 mg Oral QAC lunch   DULoxetine  30 mg Oral Daily   fluticasone furoate-vilanterol  1 puff Inhalation Daily   furosemide  40 mg Intravenous Q12H   heparin  5,000 Units Subcutaneous Q8H   insulin aspart  0-6 Units Subcutaneous TID WC   polyethylene glycol  17 g Oral BID   rilpivirine  25 mg Oral Q breakfast   sodium bicarbonate  1,300 mg Oral TID   sodium chloride flush  3 mL Intravenous Q12H   Continuous Infusions:  ferric gluconate (FERRLECIT) IVPB 250 mg (03/08/23 1239)     LOS: 1 day      Huey Bienenstock, MD [facility]   To contact the attending provider between 7A-7P or the covering provider during after hours 7P-7A, please log into the web site [email or web address] and access using universal [place] password for that web site. If you do not have the password, please call the hospital operator.  03/09/2023, 10:05 AM

## 2023-03-09 NOTE — Plan of Care (Signed)

## 2023-03-10 DIAGNOSIS — G9341 Metabolic encephalopathy: Secondary | ICD-10-CM | POA: Diagnosis not present

## 2023-03-10 DIAGNOSIS — B20 Human immunodeficiency virus [HIV] disease: Secondary | ICD-10-CM | POA: Diagnosis not present

## 2023-03-10 DIAGNOSIS — F1910 Other psychoactive substance abuse, uncomplicated: Secondary | ICD-10-CM | POA: Diagnosis not present

## 2023-03-10 LAB — RENAL FUNCTION PANEL
Albumin: 2.5 g/dL — ABNORMAL LOW (ref 3.5–5.0)
Anion gap: 8 (ref 5–15)
BUN: 56 mg/dL — ABNORMAL HIGH (ref 8–23)
CO2: 23 mmol/L (ref 22–32)
Calcium: 7.7 mg/dL — ABNORMAL LOW (ref 8.9–10.3)
Chloride: 108 mmol/L (ref 98–111)
Creatinine, Ser: 4.77 mg/dL — ABNORMAL HIGH (ref 0.61–1.24)
GFR, Estimated: 12 mL/min — ABNORMAL LOW (ref >60)
Glucose, Bld: 104 mg/dL — ABNORMAL HIGH (ref 70–99)
Phosphorus: 3.9 mg/dL (ref 2.5–4.6)
Potassium: 4.2 mmol/L (ref 3.5–5.1)
Sodium: 139 mmol/L (ref 135–145)

## 2023-03-10 LAB — GLUCOSE, CAPILLARY
Glucose-Capillary: 108 mg/dL — ABNORMAL HIGH (ref 70–99)
Glucose-Capillary: 169 mg/dL — ABNORMAL HIGH (ref 70–99)
Glucose-Capillary: 116 mg/dL — ABNORMAL HIGH (ref 70–99)
Glucose-Capillary: 262 mg/dL — ABNORMAL HIGH (ref 70–99)

## 2023-03-10 LAB — CBC
HCT: 27.1 % — ABNORMAL LOW (ref 39.0–52.0)
Hemoglobin: 9.1 g/dL — ABNORMAL LOW (ref 13.0–17.0)
MCH: 29 pg (ref 26.0–34.0)
MCHC: 33.6 g/dL (ref 30.0–36.0)
MCV: 86.3 fL (ref 80.0–100.0)
Platelets: 118 10*3/uL — ABNORMAL LOW (ref 150–400)
RBC: 3.14 MIL/uL — ABNORMAL LOW (ref 4.22–5.81)
RDW: 13.8 % (ref 11.5–15.5)
WBC: 4.2 10*3/uL (ref 4.0–10.5)
nRBC: 0 % (ref 0.0–0.2)

## 2023-03-10 LAB — CULTURE, BLOOD (ROUTINE X 2): Culture: NO GROWTH

## 2023-03-10 MED ORDER — DARBEPOETIN ALFA 100 MCG/0.5ML IJ SOSY
100.0000 ug | PREFILLED_SYRINGE | Freq: Once | INTRAMUSCULAR | Status: AC
  Administered 2023-03-10: 100 ug via SUBCUTANEOUS
  Filled 2023-03-10: qty 0.5

## 2023-03-10 MED ORDER — HYDRALAZINE HCL 25 MG PO TABS
25.0000 mg | ORAL_TABLET | Freq: Once | ORAL | Status: AC
  Administered 2023-03-10: 25 mg via ORAL
  Filled 2023-03-10: qty 1

## 2023-03-10 MED ORDER — BICTEGRAVIR-EMTRICITAB-TENOFOV 50-200-25 MG PO TABS
1.0000 | ORAL_TABLET | Freq: Every day | ORAL | Status: DC
  Administered 2023-03-10: 1 via ORAL
  Filled 2023-03-10 (×2): qty 1

## 2023-03-10 MED ORDER — TRAMADOL HCL 50 MG PO TABS
50.0000 mg | ORAL_TABLET | Freq: Once | ORAL | Status: AC
  Administered 2023-03-10: 50 mg via ORAL
  Filled 2023-03-10: qty 1

## 2023-03-10 MED ORDER — HYDRALAZINE HCL 50 MG PO TABS
50.0000 mg | ORAL_TABLET | Freq: Three times a day (TID) | ORAL | Status: DC
  Administered 2023-03-10 – 2023-03-11 (×3): 50 mg via ORAL
  Filled 2023-03-10 (×3): qty 1

## 2023-03-10 MED ORDER — SODIUM BICARBONATE 650 MG PO TABS
1300.0000 mg | ORAL_TABLET | Freq: Two times a day (BID) | ORAL | Status: DC
  Administered 2023-03-10 – 2023-03-24 (×28): 1300 mg via ORAL
  Filled 2023-03-10 (×28): qty 2

## 2023-03-10 NOTE — TOC Progression Note (Signed)
Transition of Care [facility]) - Progression Note    Patient Details  Name: Michael [name] MRN: [id number] Date of Birth: 23-May-1951  Transition of Care [facility] CM/SW Contact  Mearl Latin, LCSW Phone Number: 03/10/2023, 8:44 AM  Clinical Narrative:    Current SNF placement barriers include cocaine use, homelessness, and psych treatment history. CSW will continue to follow.    Expected Discharge Plan: Skilled Nursing Facility Barriers to Discharge: Continued Medical Work up, Homeless with medical needs, Active Substance Use - Placement, SNF Pending bed offer, Insurance Authorization  Expected Discharge Plan and Services                                               Social Determinants of Health (SDOH) Interventions SDOH Screenings   Food Insecurity: Food Insecurity Present (03/07/2023)  Housing: Patient Unable To Answer (03/07/2023)  Recent Concern: Housing - Medium Risk (02/14/2023)  Transportation Needs: Unmet Transportation Needs (03/07/2023)  Utilities: Not At Risk (03/07/2023)  Alcohol Screen: Low Risk  (11/27/2022)  Depression (PHQ2-9): High Risk (11/12/2021)  Financial Resource Strain: Medium Risk (09/16/2018)  Physical Activity: Unknown (04/22/2019)  Social Connections: Socially Isolated (09/16/2018)  Stress: Stress Concern Present (09/16/2018)  Tobacco Use: Low Risk  (03/07/2023)    Readmission Risk Interventions    02/17/2023    1:01 PM 07/05/2022   10:08 AM  Readmission Risk Prevention Plan  Transportation Screening Complete Complete  Medication Review Oceanographer) Complete Complete  PCP or Specialist appointment within 3-5 days of discharge Complete Complete  HRI or Home Care Consult Complete Complete  SW Recovery Care/Counseling Consult Complete Complete  Palliative Care Screening Not Applicable Not Applicable  Skilled Nursing Facility Not Applicable Not Applicable

## 2023-03-10 NOTE — Consult Note (Signed)
[facility]         Reason for Consult:hiv disease management    Referring Physician: [email or web address]  Principal Problem:   Acute metabolic encephalopathy Active Problems:   HIV (human immunodeficiency virus infection) ([name])   Human immunodeficiency virus (HIV) disease ([name])   Hypertension goal BP (blood pressure) < 140/80   Substance induced mood disorder ([name])   Type II diabetes mellitus with renal manifestations ([name])   S/P CABG (coronary artery bypass graft)   Paroxysmal A-fib ([name])   COPD (chronic obstructive pulmonary disease) ([name])   Polysubstance abuse ([name])   CKD (chronic kidney disease), stage IV ([name])   Pure hypercholesterolemia    HPI: Michael [name] is a 72 y.o. male with hx of afib, CAD, CKD 4, HTN, DM,HLD, and HIV disease previously on tivicay and rilpivirine, with intermittent adherence. CD 4 count of 203/VL 100 during this admission. He has hx of drug use, and unstable housing. Has not been in clinic > 6 months.   Past Medical History:  Diagnosis Date   A-fib ([name])    Anemia    Asthma    No PFTs, history of childhood asthma   CAD (coronary artery disease)    Cellulitis 04/2014   left facial   Cellulitis and abscess of toe of right foot 12/08/2019   Chondromalacia of medial femoral condyle    Left knee MRI 04/28/12: Chondromalacia of the medial femoral condyle with slight peripheral degeneration of the meniscocapsular junction of the medial meniscus; followed by sports medicine   Chronic kidney failure, stage 4 (severe) ([name])    Collagen vascular disease ([name])    Crack cocaine use    for 20+ years, has been enrolled in detox programs in the past   Depression    with history of hospitalization for suicidal ideation   Diabetes mellitus 2002   Diagnosed in 2002, started insulin in 2012   Gout    Headache(784.0)    CT head 08/2011: Periventricular and subcortical white matter hypodensities are most in keeping with chronic  microangiopathic change   HIV infection ([name]) 08/2011   Followed by Dr. Ninetta Lights   Hyperlipidemia    Hypertension    Pulmonary embolism ([name])     Allergies: No Known Allergies  Current antibiotics:   MEDICATIONS:  aspirin  81 mg Oral Daily   atorvastatin  40 mg Oral Daily   bictegravir-emtricitabine-tenofovir AF  1 tablet Oral Q breakfast   diltiazem  180 mg Oral Daily   DULoxetine  30 mg Oral Daily   fluticasone furoate-vilanterol  1 puff Inhalation Daily   heparin  5,000 Units Subcutaneous Q8H   hydrALAZINE  50 mg Oral Q8H   insulin aspart  0-6 Units Subcutaneous TID WC   polyethylene glycol  17 g Oral BID   sodium bicarbonate  1,300 mg Oral BID   sodium chloride flush  3 mL Intravenous Q12H   traMADol  50 mg Oral Once    Social History   Tobacco Use   Smoking status: Never   Smokeless tobacco: Never  Vaping Use   Vaping Use: Never used  Substance Use Topics   Alcohol use: No    Alcohol/week: 4.0 standard drinks of alcohol    Types: 2 Cans of beer, 2 Shots of liquor per week   Drug use: Not Currently    Frequency: 4.0 times per week    Types: "Crack" cocaine, Cocaine    Comment: Recent use of crack  Family History  Problem Relation Age of Onset   Diabetes Mother    Hypertension Mother    Hyperlipidemia Mother    Diabetes Father    Cancer Father    Hypertension Father    Diabetes Brother    Heart disease Brother    Diabetes Sister    Colon cancer Neg Hx      Review of Systems  Constitutional: Negative for fever, chills, diaphoresis, activity change, appetite change, fatigue and unexpected weight change.  HENT: Negative for congestion, sore throat, rhinorrhea, sneezing, trouble swallowing and sinus pressure.  Eyes: Negative for photophobia and visual disturbance.  Respiratory: Negative for cough, chest tightness, shortness of breath, wheezing and stridor.  Cardiovascular: Negative for chest pain, palpitations and leg swelling.  Gastrointestinal:  Negative for nausea, vomiting, abdominal pain, diarrhea, constipation, blood in stool, abdominal distention and anal bleeding.  Genitourinary: Negative for dysuria, hematuria, flank pain and difficulty urinating.  Musculoskeletal: Negative for myalgias, back pain, joint swelling, arthralgias and gait problem.  Skin: Negative for color change, pallor, rash and wound.  Neurological: Negative for dizziness, tremors, weakness and light-headedness.  Hematological: Negative for adenopathy. Does not bruise/bleed easily.  Psychiatric/Behavioral: Negative for behavioral problems, confusion, sleep disturbance, dysphoric mood, decreased concentration and agitation.    OBJECTIVE: Temp:  [97.9 F (36.6 C)-98.6 F (37 C)] 97.9 F (36.6 C) (06/03 1137) Pulse Rate:  [76-83] 83 (06/03 0400) Resp:  [12-17] 12 (06/03 0400) BP: (150-187)/(74-92) 151/89 (06/03 1412) SpO2:  [95 %-100 %] 100 % (06/03 0741) Weight:  [69.5 kg] 69.5 kg (06/03 0500) Physical Exam  Constitutional: He is oriented to person, place, and time. He appears well-developed and well-nourished. No distress.  HENT:  Mouth/Throat: Oropharynx is clear and moist. No oropharyngeal exudate.  Cardiovascular: Normal rate, regular rhythm and normal heart sounds. Exam reveals no gallop and no friction rub.  No murmur heard.  Pulmonary/Chest: Effort normal and breath sounds normal. No respiratory distress. He has no wheezes.  Abdominal: Soft. Bowel sounds are normal. He exhibits no distension. There is no tenderness.  Lymphadenopathy:  He has no cervical adenopathy.  Neurological: He is alert and oriented to person, place, and time.  Skin: Skin is warm and dry. No rash noted. No erythema.  Psychiatric: He has a normal mood and affect. His behavior is normal.    LABS: Results for orders placed or performed during the hospital encounter of 03/07/23 (from the past 48 hour(s))  Glucose, capillary     Status: Abnormal   Collection Time: 03/08/23   3:40 PM  Result Value Ref Range   Glucose-Capillary 147 (H) 70 - 99 mg/dL    Comment: Glucose reference range applies only to samples taken after fasting for at least 8 hours.  Glucose, capillary     Status: Abnormal   Collection Time: 03/08/23  8:33 PM  Result Value Ref Range   Glucose-Capillary 192 (H) 70 - 99 mg/dL    Comment: Glucose reference range applies only to samples taken after fasting for at least 8 hours.  Renal function panel     Status: Abnormal   Collection Time: 03/09/23  3:26 AM  Result Value Ref Range   Sodium 138 135 - 145 mmol/L   Potassium 4.1 3.5 - 5.1 mmol/L   Chloride 107 98 - 111 mmol/L   CO2 22 22 - 32 mmol/L   Glucose, Bld 131 (H) 70 - 99 mg/dL    Comment: Glucose reference range applies only to samples taken after fasting for at  least 8 hours.   BUN 66 (H) 8 - 23 mg/dL   Creatinine, Ser 1.61 (H) 0.61 - 1.24 mg/dL   Calcium 7.6 (L) 8.9 - 10.3 mg/dL   Phosphorus 3.9 2.5 - 4.6 mg/dL   Albumin 2.6 (L) 3.5 - 5.0 g/dL   GFR, Estimated 12 (L) >60 mL/min    Comment: (NOTE) Calculated using the CKD-EPI Creatinine Equation (2021)    Anion gap 9 5 - 15    Comment: Performed at [facility], [address], [place], [place] [address]  CBC     Status: Abnormal   Collection Time: 03/09/23  3:26 AM  Result Value Ref Range   WBC 4.3 4.0 - 10.5 K/uL   RBC 2.95 (L) 4.22 - 5.81 MIL/uL   Hemoglobin 8.2 (L) 13.0 - 17.0 g/dL   HCT 54.0 (L) 98.1 - 19.1 %   MCV 86.1 80.0 - 100.0 fL   MCH 27.8 26.0 - 34.0 pg   MCHC 32.3 30.0 - 36.0 g/dL   RDW 47.8 29.5 - 62.1 %   Platelets 121 (L) 150 - 400 K/uL   nRBC 0.0 0.0 - 0.2 %    Comment: Performed at [facility], [address], [place], [place] [address]  Glucose, capillary     Status: Abnormal   Collection Time: 03/09/23  7:46 AM  Result Value Ref Range   Glucose-Capillary 112 (H) 70 - 99 mg/dL    Comment: Glucose reference range applies only to samples taken after fasting for at least 8 hours.   Comment 1  Notify RN    Comment 2 Document in Chart   Glucose, capillary     Status: Abnormal   Collection Time: 03/09/23 11:50 AM  Result Value Ref Range   Glucose-Capillary 139 (H) 70 - 99 mg/dL    Comment: Glucose reference range applies only to samples taken after fasting for at least 8 hours.   Comment 1 Notify RN    Comment 2 Document in Chart   Glucose, capillary     Status: Abnormal   Collection Time: 03/09/23  3:54 PM  Result Value Ref Range   Glucose-Capillary 164 (H) 70 - 99 mg/dL    Comment: Glucose reference range applies only to samples taken after fasting for at least 8 hours.   Comment 1 Notify RN    Comment 2 Document in Chart   Glucose, capillary     Status: None   Collection Time: 03/09/23  9:20 PM  Result Value Ref Range   Glucose-Capillary 99 70 - 99 mg/dL    Comment: Glucose reference range applies only to samples taken after fasting for at least 8 hours.  Renal function panel     Status: Abnormal   Collection Time: 03/10/23  6:22 AM  Result Value Ref Range   Sodium 139 135 - 145 mmol/L   Potassium 4.2 3.5 - 5.1 mmol/L   Chloride 108 98 - 111 mmol/L   CO2 23 22 - 32 mmol/L   Glucose, Bld 104 (H) 70 - 99 mg/dL    Comment: Glucose reference range applies only to samples taken after fasting for at least 8 hours.   BUN 56 (H) 8 - 23 mg/dL   Creatinine, Ser 7.84 (H) 0.61 - 1.24 mg/dL   Calcium 7.7 (L) 8.9 - 10.3 mg/dL   Phosphorus 3.9 2.5 - 4.6 mg/dL   Albumin 2.5 (L) 3.5 - 5.0 g/dL   GFR, Estimated 12 (L) >60 mL/min  Comment: (NOTE) Calculated using the CKD-EPI Creatinine Equation (2021)    Anion gap 8 5 - 15    Comment: Performed at [facility], [name] [place], [place] [address]  CBC     Status: Abnormal   Collection Time: 03/10/23  6:22 AM  Result Value Ref Range   WBC 4.2 4.0 - 10.5 K/uL   RBC 3.14 (L) 4.22 - 5.81 MIL/uL   Hemoglobin 9.1 (L) 13.0 - 17.0 g/dL   HCT 60.4 (L) 54.0 - 98.1 %   MCV 86.3 80.0 - 100.0 fL   MCH 29.0 26.0 - 34.0 pg    MCHC 33.6 30.0 - 36.0 g/dL   RDW 19.1 47.8 - 29.5 %   Platelets 118 (L) 150 - 400 K/uL   nRBC 0.0 0.0 - 0.2 %    Comment: Performed at [facility], [address], [place], [place] [address]  Glucose, capillary     Status: Abnormal   Collection Time: 03/10/23  8:04 AM  Result Value Ref Range   Glucose-Capillary 108 (H) 70 - 99 mg/dL    Comment: Glucose reference range applies only to samples taken after fasting for at least 8 hours.  Glucose, capillary     Status: Abnormal   Collection Time: 03/10/23 11:36 AM  Result Value Ref Range   Glucose-Capillary 169 (H) 70 - 99 mg/dL    Comment: Glucose reference range applies only to samples taken after fasting for at least 8 hours.    MICRO:  IMAGING: No results found.  HISTORICAL MICRO/IMAGING  Assessment/Plan:  72yo M with hx of CAD, advanced CKD, HTN, afib, DM, but also concern for unstable housing/food insecurity that may affect adherence/effectiveness of antiviral therapy.  - continue on tivicay and rilpivirine for the time being -discuss with patient to try to identifiy his barriers to coming to clinic - currently appears to still be on goo d regimen though he has low level viremia ( VL 100)  Cd 4 count > 200 no need for oi proph

## 2023-03-10 NOTE — Progress Notes (Addendum)
Michael [name] NEPHROLOGY PROGRESS NOTE  Assessment/ Plan: 72 year old with CKD, HTN, DM p/f progressive CKD and mentation changes on admission #Renal- progressive CKD for the last 5 years, baseline cr appears to be 3 with current stable at 4.7.  BUN 56, no current symptoms of uremia. Does appear dialysis will be a need in the future, but there is concern with compliance in the patient given social history with substance use and lack of social support. Stable today, monitoring currently and hold off on dialysis while able. Will need outpatient nephrology   #Hypertension/volume status  -  Currently on Cardizem and hydralazine scheduled, stopped Lasix yesterday. Output documented 1.9, no evidence of overload on exam and hypertensive-Continue without Lasix, hydralazine scheduled on board 50 mg q8 (could consider increasing if continued systolics >150). Cardizem on board as well  #Acidosis-   oral bicarb cont #Hyperkalemia-    resolved, stable  # Anemia  - improved, 8.2>9.1, ferric gluconate x4 doses  # Low abdominal pain: has condom cath, there is documented UOP but none currently in room. Bladder scan ordered, may benefit from PVR?  # PCP needs with recent PCP in [facility] consult in for closer PCP.  # ?PO intake: Poor PO intake, encouraging PO to patient. No IVF at present, previously undergoing diuresis      Subjective:  Patient reports that he has some lower abdominal pain but is otherwise without complaint.  Objective Vital signs in last 24 hours: Vitals:   03/10/23 0537 03/10/23 0741 03/10/23 0805 03/10/23 0933  BP: (!) 187/82   (!) 150/92  Pulse:      Resp:      Temp:      TempSrc:   Oral   SpO2:  100%    Weight:      Height:       Weight change: 0 kg  Intake/Output Summary (Last 24 hours) at 03/10/2023 1102 Last data filed at 03/10/2023 0935 Gross per 24 hour  Intake 483 ml  Output 2450 ml  Net -1967 ml       Labs: RENAL PANEL Recent Labs  Lab  03/05/23 2004 03/07/23 1022 03/07/23 1029 03/07/23 1050 03/07/23 1101 03/08/23 0224 03/08/23 1135 03/09/23 0326 03/10/23 0622  NA 140 135 141  --  141 139 139 138 139  K 4.4 5.1 5.4*  --  5.4* 4.4 4.7 4.1 4.2  CL 111 108 112*  --   --  111 109 107 108  CO2 21* 13*  --   --   --  19* 21* 22 23  GLUCOSE 165* 213* 205*  --   --  107* 187* 131* 104*  BUN 60* 76* 84*  --   --  73* 74* 66* 56*  CREATININE 3.62* 5.04* 5.50*  --   --  4.79* 4.74* 4.81* 4.77*  CALCIUM 7.6* 8.0*  --   --   --  7.8* 7.9* 7.6* 7.7*  MG  --   --   --  1.8  --   --   --   --   --   PHOS  --   --   --  5.8*  --   --  4.5 3.9 3.9  ALBUMIN 3.2* 3.3*  --   --   --   --  2.8* 2.6* 2.5*    Liver Function Tests: Recent Labs  Lab 03/05/23 2004 03/07/23 1022 03/08/23 1135 03/09/23 0326 03/10/23 0622  AST 26 32  --   --   --  ALT 61* 51*  --   --   --   ALKPHOS 92 98  --   --   --   BILITOT 0.7 0.6  --   --   --   PROT 7.0 7.2  --   --   --   ALBUMIN 3.2* 3.3* 2.8* 2.6* 2.5*   No results for input(s): "LIPASE", "AMYLASE" in the last 168 hours. No results for input(s): "AMMONIA" in the last 168 hours. CBC: Recent Labs    07/02/22 1610 07/03/22 0331 07/05/22 1012 07/06/22 0157 09/05/22 0505 09/05/22 2036 10/16/22 1454 10/18/22 0824 10/22/22 1532 11/10/22 1733 01/18/23 0546 01/18/23 0926 01/22/23 0545 03/07/23 1029 03/07/23 1101 03/08/23 0224 03/09/23 0326 03/10/23 0622  HGB  --    < >  --    < > 9.7*   < >  --    < >  --    < > 7.1*  --    < > 9.5* 9.2* 8.4* 8.2* 9.1*  MCV  --    < >  --    < > 87.4   < >  --    < >  --    < > 92.0  --    < >  --   --  88.7 86.1 86.3  VITAMINB12 505  --   --   --   --   --  452  --   --   --   --  206  --   --   --   --   --   --   FOLATE  --   --   --   --   --   --   --   --   --   --   --  13.9  --   --   --   --   --   --   FERRITIN  --   --  95  --  464*  --   --   --   --   --   --  293  --   --   --  422*  --   --   TIBC  --   --  330  --  274  --   --    --  209*  --   --  193*  --   --   --  255  --   --   IRON  --   --  59  --  62  --   --   --  65  --   --  32*  --   --   --  42*  --   --   RETICCTPCT  --   --   --   --   --   --   --   --  1.3  --  0.9  --   --   --   --   --   --   --    < > = values in this interval not displayed.    Cardiac Enzymes: Recent Labs  Lab 03/08/23 0224  CKTOTAL 260   CBG: Recent Labs  Lab 03/09/23 0746 03/09/23 1150 03/09/23 1554 03/09/23 2120 03/10/23 0804  GLUCAP 112* 139* 164* 99 108*    Iron Studies:  Recent Labs    03/08/23 0224  IRON 42*  TIBC 255  FERRITIN 422*    Medications: Infusions:  ferric gluconate (FERRLECIT)  IVPB 250 mg (03/09/23 1134)    Scheduled Medications:  aspirin  81 mg Oral Daily   atorvastatin  40 mg Oral Daily   bictegravir-emtricitabine-tenofovir AF  1 tablet Oral Q breakfast   diltiazem  180 mg Oral Daily   DULoxetine  30 mg Oral Daily   fluticasone furoate-vilanterol  1 puff Inhalation Daily   heparin  5,000 Units Subcutaneous Q8H   hydrALAZINE  50 mg Oral Q8H   insulin aspart  0-6 Units Subcutaneous TID WC   polyethylene glycol  17 g Oral BID   sodium bicarbonate  1,300 mg Oral TID   sodium chloride flush  3 mL Intravenous Q12H    have reviewed scheduled and prn medications.  Physical Exam: General:NAD, comfortable Heart:RRR, s1s2 nl Lungs:clear b/l, no crackle Abdomen:soft, non-distended, TTP lower abdominal quadrants  Extremities:No edema Dialysis Access: None    Michael [name] 03/10/2023,11:02 AM  LOS: 2 days

## 2023-03-10 NOTE — Progress Notes (Signed)
PROGRESS NOTE    Michael [name]  ZOX:[id number] DOB: 03-03-1951 DOA: 03/07/2023 PCP: [name], MD    Chief Complaint  Patient presents with   Altered Mental Status    Brief Narrative:   Michael [name] is a 72 y.o. male with medical history significant of afib, CAD, stage 4 CKD, DM, HIV, HTN, and HLD presenting with AMS.   He was previously admitted from 5/10-15 for CAP.  He returned on 5/29 with LE edema, cough, and diffuse pain; he was started on BP medications and given Lasix for 5 days.  He was seen in the ER on 5/30 for "unstable living conditions" and cough with negative evaluation.  TTS evaluated him and he was cleared for dc.  He was monitored overnight to ensure safe discharge.  He returned 24 hours later with AMS.  He reports come confusion, problems with his kidneys, chest and diffuse pain.  His urine drug screen came back positive for cocaine then as well.   Assessment & Plan:   Principal Problem:   Acute metabolic encephalopathy Active Problems:   Paroxysmal A-fib ([name])   HIV (human immunodeficiency virus infection) ([name])   Human immunodeficiency virus (HIV) disease ([name])   Polysubstance abuse ([name])   COPD (chronic obstructive pulmonary disease) ([name])   Hypertension goal BP (blood pressure) < 140/80   Substance induced mood disorder ([name])   Type II diabetes mellitus with renal manifestations ([name])   S/P CABG (coronary artery bypass graft)   CKD (chronic kidney disease), stage IV ([name])   Pure hypercholesterolemia   Acute toxic encephalopathy -Presents with confusion on presentation, this is most likely due to substance abuse, as he tested positive for cocaine and he endorsed smoking crack cocaine. -As well.  There are some other complements like worsening uremia can be contributing -Mentation has improved, but he still report generalized weakness, fatigue  Advanced CKD -Progressively worsening creatinine on recent visits, baseline creatinine around  3, worsened to 5.4 on admission.  Improving initially, this has plateaued over the last few days, renal would like to defer initiation of HD especially in this patient with known history of very poor compliance.   -Renal input greatly appreciated, progressive kidney disease most likely in the setting of diabetes mellitus, and uncontrolled hypertension, HIV, likely exacerbated by poor adherence with his medications, due to his social situation. -Lactate elevated over 4 on admission -Renal input greatly appreciated, continue with current management trying to avoid hemodialysis as he is very poor candidate in the setting of polysubstance abuse and homelessness situation and known history of severe noncompliance -Lower extremity edema much improved, significant urine output on IV Lasix, now he appears to be euvolemic, diuresis has been stopped by renal.   -Continue HCO3  Acute on chronic heart failure with preserved EF -Patient with significant volume overload, elevated BNP at 417, +2 lower extremity edema, 2D echo 12/2022 significant for a preserved EF -He appears euvolemic, -7.5 L this admission, IV diuresis discontinued yesterday.     HIV  -Noncompliant with home medications.   -Previously discussed with ID and he was recommended to resume home medications at discharge from last hospitalization -Continue Tivicay, Edurant   CAD -s/p CABG -Diffuse pain, mildly elevated troponin - will trend troponin but currently doubt ACS -Continue ASA, Lipitor   Cocaine dependence -Cessation encouraged; this should be encouraged on an ongoing basis -UDS ordered, positive for cocaine, he endorse smoking crack cocaine.   HTN  -Continue diltiazem   Afib  -Rate  controlled with diltiazem -Not a good candidate for anticoagulation due to homelessness and medication nonadherence   Diabetes  -A1c I 09/2022 was 6.8, reasonable control -Not on home medications -Cover with very sensitive-scale SSI    COPD -No  apparent exacerbation at this time -Continue albuterol, Breo   Mood d/o -Continue duloextine   Anemia -In the setting of chronic kidney disease  Acidosis -Continue with oral bicarb   Lower extremity edema/pain -Unclear etiology, venous Dopplers obtained, negative for DVT, obtain total CK to rule out rhabdo in the setting of cocaine abuse, was within normal limit, PT/OT consulted, recommendation for SNF placement  DVT prophylaxis: Heparin Code Status: Full Family Communication: none at bedside Disposition: Need SNF placement, not medically ready for discharge yet given renal function not back to baseline yet, on IV diuresis   Consultants:  Renal  Subjective:  No significant events overnight as discussed with staff, he denies chest pain or shortness of breath, he still reports generalized weakness and lower extremity aching.  Objective: Vitals:   03/10/23 0537 03/10/23 0741 03/10/23 0805 03/10/23 0933  BP: (!) 187/82   (!) 150/92  Pulse:      Resp:      Temp:      TempSrc:   Oral   SpO2:  100%    Weight:      Height:        Intake/Output Summary (Last 24 hours) at 03/10/2023 1000 Last data filed at 03/10/2023 0935 Gross per 24 hour  Intake 483 ml  Output 2450 ml  Net -1967 ml   Filed Weights   03/07/23 1529 03/09/23 0500 03/10/23 0500  Weight: 72.1 kg 69.5 kg 69.5 kg    Examination:  Awake Alert, Oriented X 3, No new F.N deficits, Normal affect Symmetrical Chest wall movement, Good air movement bilaterally, CTAB RRR,No Gallops,Rubs or new Murmurs, No Parasternal Heave +ve B.Sounds, Abd Soft, No tenderness, No rebound - guarding or rigidity. No Cyanosis, Clubbing, lower extremity edema much improved     Data Reviewed: I have personally reviewed following labs and imaging studies  CBC: Recent Labs  Lab 03/05/23 2004 03/07/23 1022 03/07/23 1029 03/07/23 1101 03/08/23 0224 03/09/23 0326 03/10/23 0622  WBC 5.2 5.5  --   --  4.7 4.3 4.2  NEUTROABS 3.7   --   --   --   --   --   --   HGB 9.3* 9.1* 9.5* 9.2* 8.4* 8.2* 9.1*  HCT 29.5* 29.2* 28.0* 27.0* 25.9* 25.4* 27.1*  MCV 89.4 89.8  --   --  88.7 86.1 86.3  PLT 139* 152  --   --  115* 121* 118*    Basic Metabolic Panel: Recent Labs  Lab 03/07/23 1022 03/07/23 1029 03/07/23 1050 03/07/23 1101 03/08/23 0224 03/08/23 1135 03/09/23 0326 03/10/23 0622  NA 135 141  --  141 139 139 138 139  K 5.1 5.4*  --  5.4* 4.4 4.7 4.1 4.2  CL 108 112*  --   --  111 109 107 108  CO2 13*  --   --   --  19* 21* 22 23  GLUCOSE 213* 205*  --   --  107* 187* 131* 104*  BUN 76* 84*  --   --  73* 74* 66* 56*  CREATININE 5.04* 5.50*  --   --  4.79* 4.74* 4.81* 4.77*  CALCIUM 8.0*  --   --   --  7.8* 7.9* 7.6* 7.7*  MG  --   --  1.8  --   --   --   --   --   PHOS  --   --  5.8*  --   --  4.5 3.9 3.9    GFR: Estimated Creatinine Clearance: 13.5 mL/min (A) (by C-G formula based on SCr of 4.77 mg/dL (H)).  Liver Function Tests: Recent Labs  Lab 03/05/23 2004 03/07/23 1022 03/08/23 1135 03/09/23 0326 03/10/23 0622  AST 26 32  --   --   --   ALT 61* 51*  --   --   --   ALKPHOS 92 98  --   --   --   BILITOT 0.7 0.6  --   --   --   PROT 7.0 7.2  --   --   --   ALBUMIN 3.2* 3.3* 2.8* 2.6* 2.5*    CBG: Recent Labs  Lab 03/09/23 0746 03/09/23 1150 03/09/23 1554 03/09/23 2120 03/10/23 0804  GLUCAP 112* 139* 164* 99 108*     Recent Results (from the past 240 hour(s))  Blood culture (routine x 2)     Status: None (Preliminary result)   Collection Time: 03/07/23 12:16 PM   Specimen: BLOOD  Result Value Ref Range Status   Specimen Description BLOOD RIGHT ANTECUBITAL  Final   Special Requests   Final    BOTTLES DRAWN AEROBIC AND ANAEROBIC Blood Culture results may not be optimal due to an inadequate volume of blood received in culture bottles   Culture   Final    NO GROWTH 3 DAYS Performed at [facility], [address] [place], [place] [address]    Report Status PENDING   Incomplete  Blood culture (routine x 2)     Status: None (Preliminary result)   Collection Time: 03/07/23 12:21 PM   Specimen: BLOOD RIGHT ARM  Result Value Ref Range Status   Specimen Description BLOOD RIGHT ARM  Final   Special Requests   Final    BOTTLES DRAWN AEROBIC ONLY Blood Culture results may not be optimal due to an inadequate volume of blood received in culture bottles   Culture   Final    NO GROWTH 3 DAYS Performed at [facility], [address] [place], [place] [address]    Report Status PENDING  Incomplete         Radiology Studies: VAS [place] LOWER EXTREMITY VENOUS (DVT)  Result Date: 03/08/2023  Lower Venous DVT Study Patient Name:  PHI OLCZAK  Date of Exam:   03/08/2023 Medical Rec #: [id number]            Accession #:    [id number] Date of Birth: [birth year]            Patient Gender: M Patient Age:   21 years Exam Location:  [facility] Procedure:      VAS [place] LOWER EXTREMITY VENOUS (DVT) Referring Phys: Demonta Wombles --------------------------------------------------------------------------------  Indications: Edema.  Comparison Study: Previous study 01/23/23 negative. Performing Technologist: McKayla Maag RVT, VT  Examination Guidelines: A complete evaluation includes B-mode imaging, spectral Doppler, color Doppler, and power Doppler as needed of all accessible portions of each vessel. Bilateral testing is considered an integral part of a complete examination. Limited examinations for reoccurring indications may be performed as noted. The reflux portion of the exam is performed with the patient in reverse Trendelenburg.  +---------+---------------+---------+-----------+----------+--------------+ RIGHT    CompressibilityPhasicitySpontaneityPropertiesThrombus Aging +---------+---------------+---------+-----------+----------+--------------+ CFV      Full  Yes      Yes                                  +---------+---------------+---------+-----------+----------+--------------+ SFJ      Full                                                        +---------+---------------+---------+-----------+----------+--------------+ FV Prox  Full                                                        +---------+---------------+---------+-----------+----------+--------------+ FV Mid   Full                                                        +---------+---------------+---------+-----------+----------+--------------+ FV DistalFull                                                        +---------+---------------+---------+-----------+----------+--------------+ PFV      Full                                                        +---------+---------------+---------+-----------+----------+--------------+ POP      Full           Yes      Yes                                 +---------+---------------+---------+-----------+----------+--------------+ PTV      Full                                                        +---------+---------------+---------+-----------+----------+--------------+ PERO     Full                                                        +---------+---------------+---------+-----------+----------+--------------+   +---------+---------------+---------+-----------+----------+--------------+ LEFT     CompressibilityPhasicitySpontaneityPropertiesThrombus Aging +---------+---------------+---------+-----------+----------+--------------+ CFV      Full           Yes      Yes                                 +---------+---------------+---------+-----------+----------+--------------+ SFJ  Full                                                        +---------+---------------+---------+-----------+----------+--------------+ FV Prox  Full                                                         +---------+---------------+---------+-----------+----------+--------------+ FV Mid   Full                                                        +---------+---------------+---------+-----------+----------+--------------+ FV DistalFull                                                        +---------+---------------+---------+-----------+----------+--------------+ PFV      Full                                                        +---------+---------------+---------+-----------+----------+--------------+ POP      Full           Yes      Yes                                 +---------+---------------+---------+-----------+----------+--------------+ PTV      Full                                                        +---------+---------------+---------+-----------+----------+--------------+ PERO     Full                                                        +---------+---------------+---------+-----------+----------+--------------+     Summary: BILATERAL: - No evidence of deep vein thrombosis seen in the lower extremities, bilaterally. - No evidence of superficial venous thrombosis in the lower extremities, bilaterally. -No evidence of popliteal cyst, bilaterally.   *See table(s) above for measurements and observations. Electronically signed by Sherald Hess MD on 03/08/2023 at 2:35:56 PM.    Final         Scheduled Meds:  aspirin  81 mg Oral Daily   atorvastatin  40 mg Oral Daily   diltiazem  180 mg Oral Daily   dolutegravir  50 mg Oral QAC lunch   DULoxetine  30 mg Oral Daily   fluticasone furoate-vilanterol  1 puff  Inhalation Daily   heparin  5,000 Units Subcutaneous Q8H   hydrALAZINE  50 mg Oral Q8H   insulin aspart  0-6 Units Subcutaneous TID WC   polyethylene glycol  17 g Oral BID   rilpivirine  25 mg Oral Q breakfast   sodium bicarbonate  1,300 mg Oral TID   sodium chloride flush  3 mL Intravenous Q12H   Continuous Infusions:  ferric gluconate  (FERRLECIT) IVPB 250 mg (03/09/23 1134)     LOS: 2 days      Huey Bienenstock, MD [facility]   To contact the attending provider between 7A-7P or the covering provider during after hours 7P-7A, please log into the web site [email or web address] and access using universal [place] password for that web site. If you do not have the password, please call the hospital operator.  03/10/2023, 10:00 AM

## 2023-03-10 NOTE — Progress Notes (Signed)
Physical Therapy Treatment Patient Details Name: Michael [name] MRN: [id number] DOB: 09-Apr-1951 Today's Date: 03/10/2023   History of Present Illness 72 year old male with history of HIV, homelessness, cocaine abuse, PAF, CKD stage IV, CAD status post CABG, IDDM who returns to the hospital on 03/07/23 after a recent admission on 02/14/23  with community-acquired pneumonia as well as suicidal ideation.  This admission pt presents with AMS, chest pain and CKD. He is well-known to our system with frequent ED visits, this being his 12th visit in the past month.    PT Comments    Pt with supervision for all bed mobility, requiring increased time to complete. Pt reaches for bed rail to achieve upright seated EOB. Pt reports high level of abdominal pain in seated and agrees to attempt use of BSC for BM upon mobilization. Pt w min A for anterior weight shifting to clear hips from bed and achieve full stand to RW. Pt amb 50 ft with min G, min A for 2 minor posterior-lateral LOB, and x2 therapists for a close chair-follow. Gait speed significantly reduced with forward trunk flexion and decreased foot clearance. Pt expressed greater desire to return to bed than an attempt for BM at [facility]. Pt will continue to benefit from skilled therapy during their acute admission to facilitate improvements in functional transferring, gait, dynamic balance, and functional endurance.   Recommendations for follow up therapy are one component of a multi-disciplinary discharge planning process, led by the attending physician.  Recommendations may be updated based on patient status, additional functional criteria and insurance authorization.  Follow Up Recommendations  Can patient physically be transported by private vehicle: No    Assistance Recommended at Discharge Frequent or constant Supervision/Assistance  Patient can return home with the following A lot of help with walking and/or transfers;A little help with  bathing/dressing/bathroom;Assistance with cooking/housework;Direct supervision/assist for medications management;Assist for transportation;Help with stairs or ramp for entrance;Direct supervision/assist for financial management   Equipment Recommendations  Rollator (4 wheels)    Recommendations for Other Services       Precautions / Restrictions Precautions Precautions: Fall Restrictions Weight Bearing Restrictions: No     Mobility  Bed Mobility Overal bed mobility: Needs Assistance Bed Mobility: Supine to Sit, Sit to Supine     Supine to sit: Supervision Sit to supine: Supervision   General bed mobility comments: Increased effort/time    Transfers Overall transfer level: Needs assistance Equipment used: Rolling walker (2 wheels) Transfers: Sit to/from Stand Sit to Stand: Min guard, Min assist                Ambulation/Gait Ambulation/Gait assistance: Min assist, Min guard Gait Distance (Feet): 50 Feet Assistive device: Rolling walker (2 wheels) Gait Pattern/deviations: Step-to pattern, Decreased stride length, Trunk flexed, Narrow base of support Gait velocity: decr Gait velocity interpretation: <1.31 ft/sec, indicative of household Biomedical scientist Rankin (Stroke Patients Only)       Balance Overall balance assessment: History of Falls, Needs assistance Sitting-balance support: Feet supported, Single extremity supported Sitting balance-Leahy Scale: Good     Standing balance support: Reliant on assistive device for balance, During functional activity, Bilateral upper extremity supported Standing balance-Leahy Scale: Poor                              Cognition Arousal/Alertness:  Awake/alert Behavior During Therapy: WFL for tasks assessed/performed Overall Cognitive Status: Impaired/Different from baseline Area of Impairment: Safety/judgement, Awareness, Problem solving                          Safety/Judgement: Decreased awareness of deficits Awareness: Emergent Problem Solving: Slow processing, Requires verbal cues, Decreased initiation General Comments:  (Pleasant and able to follow cues)        Exercises      General Comments General comments (skin integrity, edema, etc.): Pt in visible pain from reported abdmoninal region, initially wanted to attempt toileting for potential BM but expressed desire to return to bed following completion of session.      Pertinent Vitals/Pain Pain Assessment Pain Assessment: Faces Faces Pain Scale: Hurts whole lot Pain Intervention(s): Limited activity within patient's tolerance, Monitored during session    Home Living                          Prior Function            PT Goals (current goals can now be found in the care plan section) Acute Rehab PT Goals Patient Stated Goal: none stated PT Goal Formulation: With patient Time For Goal Achievement: 03/22/23 Potential to Achieve Goals: Good Progress towards PT goals: Progressing toward goals    Frequency    Min 3X/week      PT Plan Current plan remains appropriate    Co-evaluation              AM-PAC PT "6 Clicks" Mobility   Outcome Measure  Help needed turning from your back to your side while in a flat bed without using bedrails?: A Little Help needed moving from lying on your back to sitting on the side of a flat bed without using bedrails?: A Little Help needed moving to and from a bed to a chair (including a wheelchair)?: A Little Help needed standing up from a chair using your arms (e.g., wheelchair or bedside chair)?: A Little Help needed to walk in hospital room?: A Little Help needed climbing 3-5 steps with a railing? : Total 6 Click Score: 16    End of Session Equipment Utilized During Treatment: Gait belt Activity Tolerance: Patient tolerated treatment well;Patient limited by pain;Patient limited by  fatigue Patient left: in bed;with call bell/phone within reach;with bed alarm set Nurse Communication: Other (comment) (pt abdominal pain) PT Visit Diagnosis: Muscle weakness (generalized) (M62.81);History of falling (Z91.81);Difficulty in walking, not elsewhere classified (R26.2)     Time: 1610-1630 PT Time Calculation (min) (ACUTE ONLY): 20 min  Charges:  $Gait Training: 8-22 mins                     Hendricks Milo, SPT  Acute Rehabilitation Services    Hendricks Milo 03/10/2023, 5:01 PM

## 2023-03-10 NOTE — Plan of Care (Signed)

## 2023-03-11 ENCOUNTER — Other Ambulatory Visit (HOSPITAL_COMMUNITY): Payer: Self-pay

## 2023-03-11 DIAGNOSIS — R112 Nausea with vomiting, unspecified: Secondary | ICD-10-CM | POA: Diagnosis not present

## 2023-03-11 DIAGNOSIS — R4182 Altered mental status, unspecified: Secondary | ICD-10-CM

## 2023-03-11 DIAGNOSIS — B20 Human immunodeficiency virus [HIV] disease: Secondary | ICD-10-CM | POA: Diagnosis not present

## 2023-03-11 DIAGNOSIS — K5900 Constipation, unspecified: Secondary | ICD-10-CM | POA: Diagnosis not present

## 2023-03-11 DIAGNOSIS — N179 Acute kidney failure, unspecified: Secondary | ICD-10-CM | POA: Diagnosis not present

## 2023-03-11 LAB — GLUCOSE, CAPILLARY
Glucose-Capillary: 126 mg/dL — ABNORMAL HIGH (ref 70–99)
Glucose-Capillary: 140 mg/dL — ABNORMAL HIGH (ref 70–99)
Glucose-Capillary: 160 mg/dL — ABNORMAL HIGH (ref 70–99)
Glucose-Capillary: 128 mg/dL — ABNORMAL HIGH (ref 70–99)

## 2023-03-11 LAB — RENAL FUNCTION PANEL
Albumin: 2.6 g/dL — ABNORMAL LOW (ref 3.5–5.0)
Anion gap: 10 (ref 5–15)
BUN: 47 mg/dL — ABNORMAL HIGH (ref 8–23)
CO2: 24 mmol/L (ref 22–32)
Calcium: 8 mg/dL — ABNORMAL LOW (ref 8.9–10.3)
Chloride: 104 mmol/L (ref 98–111)
Creatinine, Ser: 4.57 mg/dL — ABNORMAL HIGH (ref 0.61–1.24)
GFR, Estimated: 13 mL/min — ABNORMAL LOW (ref >60)
Glucose, Bld: 130 mg/dL — ABNORMAL HIGH (ref 70–99)
Phosphorus: 4.2 mg/dL (ref 2.5–4.6)
Potassium: 4.3 mmol/L (ref 3.5–5.1)
Sodium: 138 mmol/L (ref 135–145)

## 2023-03-11 LAB — CULTURE, BLOOD (ROUTINE X 2)

## 2023-03-11 MED ORDER — NEPRO/CARBSTEADY PO LIQD
237.0000 mL | Freq: Three times a day (TID) | ORAL | Status: DC
  Administered 2023-03-11 – 2023-03-17 (×5): 237 mL via ORAL

## 2023-03-11 MED ORDER — HYDRALAZINE HCL 50 MG PO TABS
75.0000 mg | ORAL_TABLET | Freq: Three times a day (TID) | ORAL | Status: DC
  Administered 2023-03-11 – 2023-03-24 (×39): 75 mg via ORAL
  Filled 2023-03-11 (×39): qty 1

## 2023-03-11 MED ORDER — GABAPENTIN 100 MG PO CAPS
100.0000 mg | ORAL_CAPSULE | Freq: Two times a day (BID) | ORAL | Status: DC
  Administered 2023-03-11 – 2023-03-24 (×27): 100 mg via ORAL
  Filled 2023-03-11 (×28): qty 1

## 2023-03-11 MED ORDER — RENA-VITE PO TABS
1.0000 | ORAL_TABLET | Freq: Every day | ORAL | Status: DC
  Administered 2023-03-11 – 2023-03-23 (×14): 1 via ORAL
  Filled 2023-03-11 (×14): qty 1

## 2023-03-11 MED ORDER — BISACODYL 5 MG PO TBEC
10.0000 mg | DELAYED_RELEASE_TABLET | Freq: Once | ORAL | Status: AC
  Administered 2023-03-11: 10 mg via ORAL
  Filled 2023-03-11: qty 2

## 2023-03-11 MED ORDER — DOLUTEGRAVIR-RILPIVIRINE 50-25 MG PO TABS
1.0000 | ORAL_TABLET | Freq: Every day | ORAL | Status: DC
  Administered 2023-03-11: 1 via ORAL
  Filled 2023-03-11 (×2): qty 1

## 2023-03-11 NOTE — TOC Benefit Eligibility Note (Signed)
Patient Product/process development scientist completed.    The patient is currently admitted and upon discharge could be taking Biktarvy 50-200-25 mg tablets.  The current 30 day co-pay is $0.00.   The patient is insured through [employer]   This test claim was processed through [facility]- copay amounts may vary at other pharmacies due to pharmacy/plan contracts, or as the patient moves through the different stages of their insurance plan.  Roland Earl, CPHT Pharmacy Patient Advocate Specialist [facility] Patient Advocate Team Direct Number: [phone]  Fax: [phone]

## 2023-03-11 NOTE — Plan of Care (Signed)

## 2023-03-11 NOTE — Progress Notes (Signed)
Mobility Specialist Progress Note   03/11/23 1455  Mobility  Activity Ambulated with assistance in hallway  Level of Assistance Minimal assist, patient does 75% or more  Assistive Device Front wheel walker  Distance Ambulated (ft) 60 ft  Activity Response Tolerated well  Mobility Referral Yes  $Mobility charge 1 Mobility  Mobility Specialist Start Time (ACUTE ONLY) 1455  Mobility Specialist Stop Time (ACUTE ONLY) 1520  Mobility Specialist Time Calculation (min) (ACUTE ONLY) 25 min   Received pt in bed c/o LE, back and abdominal pain but agreeable to mobility. MinA to stand and CGA to ambulate in hallway. Session limited by increasing abdominal pain. Returned back to bed w/o fault, call bell in reach and bed alarm on. RN notified.  Frederico Hamman Mobility Specialist Please contact via SecureChat or  Rehab office at [phone]

## 2023-03-11 NOTE — Progress Notes (Signed)
Occupational Therapy Treatment Patient Details Name: Michael [name] MRN: [id number] DOB: 15-Apr-1951 Today's Date: 03/11/2023   History of present illness 72 year old male with history of HIV, homelessness, cocaine abuse, PAF, CKD stage IV, CAD status post CABG, IDDM who returns to the hospital on 03/07/23 after a recent admission on 02/14/23  with community-acquired pneumonia as well as suicidal ideation.  This admission pt presents with AMS, chest pain and CKD. He is well-known to our system with frequent ED visits, this being his 12th visit in the past month.   OT comments  Pt pleasant and agreeable to session, but reporting max abdominal pain. Challenging activity tolerance, safety, and cognition this session. Pt ambulatory to restroom with min guard A. Performing hand hygiene with proper placement of RW at sink without cueing, however, pt did require cues to recall tasks to be performed throughout. Answering 3 home safety prompts with min cues (what do you do if you have chest pain and SHOB; what is the emergency number; what do you do if you have a burn that has been infected). Will continue to follow.    Recommendations for follow up therapy are one component of a multi-disciplinary discharge planning process, led by the attending physician.  Recommendations may be updated based on patient status, additional functional criteria and insurance authorization.    Assistance Recommended at Discharge Frequent or constant Supervision/Assistance  Patient can return home with the following  A lot of help with walking and/or transfers;Assistance with cooking/housework;Help with stairs or ramp for entrance;Assist for transportation;Direct supervision/assist for financial management;Direct supervision/assist for medications management;A lot of help with bathing/dressing/bathroom;Two people to help with walking and/or transfers   Equipment Recommendations  Other (comment) (defer)    Recommendations for  Other Services      Precautions / Restrictions Precautions Precautions: Fall Restrictions Weight Bearing Restrictions: No       Mobility Bed Mobility Overal bed mobility: Needs Assistance Bed Mobility: Supine to Sit     Supine to sit: Supervision     General bed mobility comments: Increased effort/time    Transfers Overall transfer level: Needs assistance Equipment used: Rolling walker (2 wheels) Transfers: Sit to/from Stand Sit to Stand: Min guard           General transfer comment: for safety     Balance Overall balance assessment: History of Falls, Needs assistance Sitting-balance support: Feet supported, Single extremity supported Sitting balance-Leahy Scale: Good     Standing balance support: Reliant on assistive device for balance, During functional activity, Bilateral upper extremity supported Standing balance-Leahy Scale: Poor                             ADL either performed or assessed with clinical judgement   ADL Overall ADL's : Needs assistance/impaired     Grooming: Wash/dry hands;Min guard;Standing Grooming Details (indicate cue type and reason): Increased time for locating soap and paper towels.                 Toilet Transfer: Min guard;Ambulation;Rolling walker (2 wheels);Regular Toilet;Grab bars Toilet Transfer Details (indicate cue type and reason): up from low toilet in room with grab bars. Toileting- Clothing Manipulation and Hygiene: Supervision/safety;Sitting/lateral lean Toileting - Clothing Manipulation Details (indicate cue type and reason): on toilet     Functional mobility during ADLs: Min guard;Rolling walker (2 wheels)      Extremity/Trunk Assessment  Vision   Additional Comments: pt reports blurred vision for years.   Perception     Praxis      Cognition Arousal/Alertness: Awake/alert Behavior During Therapy: WFL for tasks assessed/performed Overall Cognitive Status:  Impaired/Different from baseline Area of Impairment: Safety/judgement, Awareness, Problem solving, Memory                     Memory: Decreased short-term memory   Safety/Judgement: Decreased awareness of deficits Awareness: Emergent Problem Solving: Slow processing, Requires verbal cues, Decreased initiation General Comments: cues for memory when following commands such as walk to the restroom. Min cues for problem solving when asking application based safety questions (i.e. if you have a burn and it becomes infected, what should you do about it).        Exercises      Shoulder Instructions       General Comments      Pertinent Vitals/ Pain       Pain Assessment Pain Assessment: Faces Faces Pain Scale: Hurts even more Pain Location: abdomen Pain Descriptors / Indicators: Discomfort, Grimacing Pain Intervention(s): Limited activity within patient's tolerance, Monitored during session  Home Living                                          Prior Functioning/Environment              Frequency  Min 1X/week        Progress Toward Goals  OT Goals(current goals can now be found in the care plan section)  Progress towards OT goals: Progressing toward goals  Acute Rehab OT Goals Patient Stated Goal: have BM OT Goal Formulation: With patient Time For Goal Achievement: 03/22/23 Potential to Achieve Goals: Fair ADL Goals Pt Will Perform Grooming: with supervision;standing (Tolerating at least 3 grooming tasks with RPE <5/10 and at least fair standing balance at sink,) Pt Will Perform Lower Body Bathing: with min guard assist;sit to/from stand;sitting/lateral leans Pt Will Perform Lower Body Dressing: with set-up;with supervision;with adaptive equipment;sitting/lateral leans;sit to/from stand Pt Will Transfer to Toilet: ambulating;with supervision;regular height toilet Pt Will Perform Toileting - Clothing Manipulation and hygiene: with  supervision;sitting/lateral leans;sit to/from stand Pt/caregiver will Perform Home Exercise Program: Increased ROM;Increased strength;Both right and left upper extremity;With Supervision (including postural exercises and stretching) Additional ADL Goal #1: Pt will demonstrate improved mentation by scoring <4/10 on short blessed test and answering 4/4 safety questions from the [facility] correctly:   1. What do you do for yourself if you are sick with a cold.    2. What do you do if you burn yourself and the wound becomes infected.    3. What do you do if you experience severe chest pain and shortness of breath?   4. What number do you call in an emergency?  Plan Discharge plan remains appropriate;Frequency remains appropriate    Co-evaluation                 AM-PAC OT "6 Clicks" Daily Activity     Outcome Measure   Help from another person eating meals?: A Little Help from another person taking care of personal grooming?: A Little Help from another person toileting, which includes using toliet, bedpan, or urinal?: A Lot Help from another person bathing (including washing, rinsing, drying)?: A Lot Help from another person to put on and taking off regular upper body  clothing?: A Little Help from another person to put on and taking off regular lower body clothing?: A Lot 6 Click Score: 15    End of Session Equipment Utilized During Treatment: Gait belt;Rolling walker (2 wheels)  OT Visit Diagnosis: Unsteadiness on feet (R26.81);Muscle weakness (generalized) (M62.81);Pain   Activity Tolerance Patient tolerated treatment well   Patient Left in bed;with call bell/phone within reach;with bed alarm set   Nurse Communication Mobility status        Time: 1211-1232 OT Time Calculation (min): 21 min  Charges: OT General Charges $OT Visit: 1 Visit OT Treatments $Self Care/Home Management : 8-22 mins  Tyler Deis, OTR/L [facility] Acute Rehabilitation Office: [phone]   Myrla Halsted 03/11/2023, 1:08 PM

## 2023-03-11 NOTE — Progress Notes (Signed)
[facility]    Date of Admission:  03/07/2023     ID: Michael [name] is a 72 y.o. male with  hiv disease Principal Problem:   Acute metabolic encephalopathy Active Problems:   HIV (human immunodeficiency virus infection) ([name])   Human immunodeficiency virus (HIV) disease ([name])   Hypertension goal BP (blood pressure) < 140/80   Substance induced mood disorder ([name])   Type II diabetes mellitus with renal manifestations ([name])   S/P CABG (coronary artery bypass graft)   Paroxysmal A-fib ([name])   COPD (chronic obstructive pulmonary disease) ([name])   Polysubstance abuse ([name])   CKD (chronic kidney disease), stage IV ([name])   Pure hypercholesterolemia    Subjective: Reports having some abdominal distention since being constipated. Had a bout of nausea and vomiting this morning  Medications:   aspirin  81 mg Oral Daily   atorvastatin  40 mg Oral Daily   diltiazem  180 mg Oral Daily   dolutegravir-rilpivirine  1 tablet Oral Q lunch   DULoxetine  30 mg Oral Daily   feeding supplement (NEPRO CARB STEADY)  237 mL Oral TID BM   fluticasone furoate-vilanterol  1 puff Inhalation Daily   gabapentin  100 mg Oral BID   heparin  5,000 Units Subcutaneous Q8H   hydrALAZINE  75 mg Oral Q8H   insulin aspart  0-6 Units Subcutaneous TID WC   multivitamin  1 tablet Oral QHS   polyethylene glycol  17 g Oral BID   sodium bicarbonate  1,300 mg Oral BID   sodium chloride flush  3 mL Intravenous Q12H    Objective: Vital signs in last 24 hours: Temp:  [97.9 F (36.6 C)-98.2 F (36.8 C)] 97.9 F (36.6 C) (06/04 1610) Pulse Rate:  [80-96] 85 (06/04 1208) Resp:  [17-18] 17 (06/04 1208) BP: (120-173)/(80-106) 152/96 (06/04 1423) SpO2:  [95 %-99 %] 99 % (06/04 1208) Weight:  [69.8 kg] 69.8 kg (06/04 0453)  Physical Exam  Constitutional: He is oriented to person, place, and time. He appears well-developed and well-nourished. No distress.  HENT:  Mouth/Throat:  Oropharynx is clear and moist. No oropharyngeal exudate.  Cardiovascular: Normal rate, regular rhythm and normal heart sounds. Exam reveals no gallop and no friction rub.  No murmur heard.  Pulmonary/Chest: Effort normal and breath sounds normal. No respiratory distress. He has no wheezes.  Abdominal: Soft. Bowel sounds are normal. He exhibits no distension. There is no tenderness.  Lymphadenopathy:  He has no cervical adenopathy.  Neurological: He is alert and oriented to person, place, and time.  Skin: Skin is warm and dry. No rash noted. No erythema.  Psychiatric: He has a normal mood and affect. His behavior is normal.    Lab Results Recent Labs    03/09/23 0326 03/10/23 0622 03/11/23 0417  WBC 4.3 4.2  --   HGB 8.2* 9.1*  --   HCT 25.4* 27.1*  --   NA 138 139 138  K 4.1 4.2 4.3  CL 107 108 104  CO2 22 23 24   BUN 66* 56* 47*  CREATININE 4.81* 4.77* 4.57*   Liver Panel Recent Labs    03/10/23 0622 03/11/23 0417  ALBUMIN 2.5* 2.6*   Sedimentation Rate No results for input(s): "ESRSEDRATE" in the last 72 hours. C-Reactive Protein No results for input(s): "CRP" in the last 72 hours.  Microbiology:  Studies/Results: No results found.   Assessment and Plan:  HIv disease = if having difficulty with eating, would  prefer to switch to dovato (has DTG and lamivudine instead of rilpivirine) we will check his genotype  Nausea and vomiting = unclear if related to constipation or sequelae of diabetes. Will see if anti emetics help  Constipation = would continue with miralax and if not working do a bisacodyl suppository. Abdominal exam is benign  Altered mental status = appears appropriate wen asking qeustions but he has mostly been bed bound over the last 2 days  [facility] Pager: [phone]  03/11/2023, 4:21 PM

## 2023-03-11 NOTE — Progress Notes (Signed)
Initial Nutrition Assessment  DOCUMENTATION CODES:  Non-severe (moderate) malnutrition in context of chronic illness  INTERVENTION:  Renal Multivitamin w/ minerals daily Liberalize pt diet to regular due to poor PO intake and malnutrition. Nepro Shake po TID, each supplement provides 425 kcal and 19 grams protein  NUTRITION DIAGNOSIS:  Moderate Malnutrition related to chronic illness as evidenced by moderate muscle depletion, mild fat depletion.  GOAL:  Patient will meet greater than or equal to 90% of their needs  MONITOR:  PO intake, Supplement acceptance, Labs, I & O's  REASON FOR ASSESSMENT:  Malnutrition Screening Tool   ASSESSMENT:  72 y.o. male presented to the ED with AMS. Recently admitted 5/10-5/15 for CAP. PMH includes COPD, T2DM, HIV, polysubstance abuse, malnutrition, GERD, Hepatitis C, gout, CKD IV, and s/p CABG. Pt admitted with acute metabolic encephalopathy.   Pt laying in bed, reports that his appetitive has been poor for a little bit. Reports that he has not felt well today, having an upset stomach.  Unable to provide UBW, but reports recent weight loss. Reports using a walker at home and has been feeling more unsteady.   RD reviewed menu and discussed liberalizing pt diet to regular to increase PO intake.   Meal Intake  6/1-6/3: 50-100% x 5 meals   Medications reviewed and include: NovoLog SSI, Miralax, Sodium Bicarbonate, IV ferric gluconate Labs reviewed: Sodium 138, Potassium 4.3, BUN 47, Creatinine 4.57, Phosphorus 4.2  NUTRITION - FOCUSED PHYSICAL EXAM:  Flowsheet Row Most Recent Value  Orbital Region Mild depletion  Upper Arm Region Mild depletion  Thoracic and Lumbar Region No depletion  Buccal Region Mild depletion  Temple Region No depletion  Clavicle Bone Region Moderate depletion  Clavicle and Acromion Bone Region Moderate depletion  Scapular Bone Region Mild depletion  Dorsal Hand Severe depletion  Patellar Region Mild depletion   Anterior Thigh Region Mild depletion  Posterior Calf Region Mild depletion   Diet Order:   Diet Order             Diet regular Room service appropriate? Yes with Assist; Fluid consistency: Thin  Diet effective now                   EDUCATION NEEDS:   No education needs have been identified at this time  Skin:  Skin Assessment: Reviewed RN Assessment  Last BM:  5/31  Height:  Ht Readings from Last 1 Encounters:  03/07/23 5\' 8"  (1.727 m)   Weight:  Wt Readings from Last 1 Encounters:  03/11/23 69.8 kg   Ideal Body Weight:  70 kg  BMI:  Body mass index is 23.4 kg/m.  Estimated Nutritional Needs:  Kcal:  1800-2000 Protein:  90-110 grams Fluid:  >/= 1.8 L   Kirby Crigler RD, LDN Clinical Dietitian See [facility] for contact information.

## 2023-03-11 NOTE — Care Management Important Message (Signed)
Important Message  Patient Details  Name: Michael [name] MRN: [id number] Date of Birth: [birth year]   Medicare Important Message Given:  Yes     Dorena Bodo 03/11/2023, 2:26 PM

## 2023-03-11 NOTE — Progress Notes (Signed)
PROGRESS NOTE    Michael [name]  NWG:[id number] DOB: 18-Jan-1951 DOA: 03/07/2023 PCP: [name], MD    Chief Complaint  Patient presents with   Altered Mental Status    Brief Narrative:   Michael [name] is a 72 y.o. male with medical history significant of afib, CAD, stage 4 CKD, DM, HIV, HTN, and HLD presenting with AMS.   He was previously admitted from 5/10-15 for CAP.  He returned on 5/29 with LE edema, cough, and diffuse pain; he was started on BP medications and given Lasix for 5 days.  He was seen in the ER on 5/30 for "unstable living conditions" and cough with negative evaluation.  TTS evaluated him and he was cleared for dc.  He was monitored overnight to ensure safe discharge.  He returned 24 hours later with AMS.  He reports come confusion, problems with his kidneys, chest and diffuse pain.  His urine drug screen came back positive for cocaine then as well.   Assessment & Plan:   Principal Problem:   Acute metabolic encephalopathy Active Problems:   Paroxysmal A-fib ([name])   HIV (human immunodeficiency virus infection) ([name])   Human immunodeficiency virus (HIV) disease ([name])   Polysubstance abuse ([name])   COPD (chronic obstructive pulmonary disease) ([name])   Hypertension goal BP (blood pressure) < 140/80   Substance induced mood disorder ([name])   Type II diabetes mellitus with renal manifestations ([name])   S/P CABG (coronary artery bypass graft)   CKD (chronic kidney disease), stage IV ([name])   Pure hypercholesterolemia   Acute toxic encephalopathy -Presents with confusion on presentation, this is most likely due to substance abuse, as he tested positive for cocaine and he endorsed smoking crack cocaine. -As well.  There are some other complements like worsening uremia can be contributing -Mentation has improved, but he still report generalized weakness, fatigue  Advanced CKD -Progressively worsening creatinine on recent visits, baseline creatinine around  3, worsened to 5.4 on admission.  Improving initially, this has plateaued over the last few days, renal would like to defer initiation of HD especially in this patient with known history of very poor compliance.   -Renal input greatly appreciated, progressive kidney disease most likely in the setting of diabetes mellitus, and uncontrolled hypertension, HIV, likely exacerbated by poor adherence with his medications, due to his social situation. -Lactate elevated over 4 on admission -Renal input greatly appreciated, continue with current management trying to avoid hemodialysis as he is very poor candidate in the setting of polysubstance abuse and homelessness situation and known history of severe noncompliance, for no indication for HD, renal has signed off. -Lower extremity edema much improved, significant urine output on IV Lasix, now he appears to be euvolemic, diuresis has been stopped by renal.   -Continue HCO3  Acute on chronic heart failure with preserved EF -Patient with significant volume overload, elevated BNP at 417, +2 lower extremity edema, 2D echo 12/2022 significant for a preserved EF -He appears euvolemic, he is negative 8.6 L for this admission, IV diuresis has been discontinued, he remains euvolemic.     HIV  -Noncompliant with home medications.   -The input greatly appreciated, continue with current regimen.  CAD -s/p CABG -Diffuse pain, mildly elevated troponin - will trend troponin but currently doubt ACS -Continue ASA, Lipitor   Cocaine dependence -Cessation encouraged; this should be encouraged on an ongoing basis -UDS ordered, positive for cocaine, he endorse smoking crack cocaine.   HTN  -Continue diltiazem, remains  poorly controlled, started on hydralazine, uptitrated dose.   Afib  -Rate controlled with diltiazem -Not a good candidate for anticoagulation due to homelessness and medication nonadherence   Diabetes  -A1c I 09/2022 was 6.8, reasonable control -Not  on home medications -Cover with very sensitive-scale SSI    COPD -No apparent exacerbation at this time -Continue albuterol, Breo   Mood d/o -Continue duloextine   Anemia -In the setting of chronic kidney disease  Acidosis -Continue with oral bicarb   Lower extremity edema/pain -Unclear etiology, venous Dopplers obtained, negative for DVT, obtain total CK to rule out rhabdo in the setting of cocaine abuse, was within normal limit, PT/OT consulted, recommendation for SNF placement -Will start on gabapentin for possible neuropathic pain.  DVT prophylaxis: Heparin Code Status: Full Family Communication: none at bedside Disposition: Need SNF placement, not medically ready for discharge yet given renal function not back to baseline yet, on IV diuresis   Consultants:  Renal Infectious disease  Subjective:  No significant events overnight, he is reporting lower extremity pain..  Objective: Vitals:   03/11/23 0558 03/11/23 0738 03/11/23 0754 03/11/23 0800  BP: (!) 164/106  (!) 142/87 120/88  Pulse:   95 96  Resp:      Temp:   98 F (36.7 C)   TempSrc:   Oral   SpO2:  98% 98% 99%  Weight:      Height:        Intake/Output Summary (Last 24 hours) at 03/11/2023 0921 Last data filed at 03/11/2023 0600 Gross per 24 hour  Intake 486 ml  Output 1550 ml  Net -1064 ml   Filed Weights   03/09/23 0500 03/10/23 0500 03/11/23 0453  Weight: 69.5 kg 69.5 kg 69.8 kg    Examination:  Awake Alert, Oriented X 3, No new F.N deficits, Normal affect Symmetrical Chest wall movement, Good air movement bilaterally, CTAB RRR,No Gallops,Rubs or new Murmurs, No Parasternal Heave +ve B.Sounds, Abd Soft, No tenderness, No rebound - guarding or rigidity. No Cyanosis, Clubbing or edema, No new Rash or bruise       Data Reviewed: I have personally reviewed following labs and imaging studies  CBC: Recent Labs  Lab 03/05/23 2004 03/07/23 1022 03/07/23 1029 03/07/23 1101 03/08/23 0224  03/09/23 0326 03/10/23 0622  WBC 5.2 5.5  --   --  4.7 4.3 4.2  NEUTROABS 3.7  --   --   --   --   --   --   HGB 9.3* 9.1* 9.5* 9.2* 8.4* 8.2* 9.1*  HCT 29.5* 29.2* 28.0* 27.0* 25.9* 25.4* 27.1*  MCV 89.4 89.8  --   --  88.7 86.1 86.3  PLT 139* 152  --   --  115* 121* 118*    Basic Metabolic Panel: Recent Labs  Lab 03/07/23 1050 03/07/23 1101 03/08/23 0224 03/08/23 1135 03/09/23 0326 03/10/23 0622 03/11/23 0417  NA  --    < > 139 139 138 139 138  K  --    < > 4.4 4.7 4.1 4.2 4.3  CL  --   --  111 109 107 108 104  CO2  --   --  19* 21* 22 23 24   GLUCOSE  --   --  107* 187* 131* 104* 130*  BUN  --   --  73* 74* 66* 56* 47*  CREATININE  --   --  4.79* 4.74* 4.81* 4.77* 4.57*  CALCIUM  --   --  7.8* 7.9* 7.6* 7.7* 8.0*  MG 1.8  --   --   --   --   --   --   PHOS 5.8*  --   --  4.5 3.9 3.9 4.2   < > = values in this interval not displayed.    GFR: Estimated Creatinine Clearance: 14.1 mL/min (A) (by C-G formula based on SCr of 4.57 mg/dL (H)).  Liver Function Tests: Recent Labs  Lab 03/05/23 2004 03/07/23 1022 03/08/23 1135 03/09/23 0326 03/10/23 0622 03/11/23 0417  AST 26 32  --   --   --   --   ALT 61* 51*  --   --   --   --   ALKPHOS 92 98  --   --   --   --   BILITOT 0.7 0.6  --   --   --   --   PROT 7.0 7.2  --   --   --   --   ALBUMIN 3.2* 3.3* 2.8* 2.6* 2.5* 2.6*    CBG: Recent Labs  Lab 03/10/23 0804 03/10/23 1136 03/10/23 1714 03/10/23 2018 03/11/23 0753  GLUCAP 108* 169* 116* 262* 126*     Recent Results (from the past 240 hour(s))  Blood culture (routine x 2)     Status: None (Preliminary result)   Collection Time: 03/07/23 12:16 PM   Specimen: BLOOD  Result Value Ref Range Status   Specimen Description BLOOD RIGHT ANTECUBITAL  Final   Special Requests   Final    BOTTLES DRAWN AEROBIC AND ANAEROBIC Blood Culture results may not be optimal due to an inadequate volume of blood received in culture bottles   Culture   Final    NO GROWTH 3  DAYS Performed at [facility], [address] [place], [place] [address]    Report Status PENDING  Incomplete  Blood culture (routine x 2)     Status: None (Preliminary result)   Collection Time: 03/07/23 12:21 PM   Specimen: BLOOD RIGHT ARM  Result Value Ref Range Status   Specimen Description BLOOD RIGHT ARM  Final   Special Requests   Final    BOTTLES DRAWN AEROBIC ONLY Blood Culture results may not be optimal due to an inadequate volume of blood received in culture bottles   Culture   Final    NO GROWTH 3 DAYS Performed at [facility], [address], [place], [place] [address]    Report Status PENDING  Incomplete         Radiology Studies: No results found.      Scheduled Meds:  aspirin  81 mg Oral Daily   atorvastatin  40 mg Oral Daily   bictegravir-emtricitabine-tenofovir AF  1 tablet Oral Q breakfast   diltiazem  180 mg Oral Daily   DULoxetine  30 mg Oral Daily   fluticasone furoate-vilanterol  1 puff Inhalation Daily   gabapentin  100 mg Oral BID   heparin  5,000 Units Subcutaneous Q8H   hydrALAZINE  75 mg Oral Q8H   insulin aspart  0-6 Units Subcutaneous TID WC   polyethylene glycol  17 g Oral BID   sodium bicarbonate  1,300 mg Oral BID   sodium chloride flush  3 mL Intravenous Q12H   Continuous Infusions:  ferric gluconate (FERRLECIT) IVPB 250 mg (03/10/23 1143)     LOS: 3 days      Huey Bienenstock, MD [facility]   To contact the attending provider between 7A-7P or the covering provider during  after hours 7P-7A, please log into the web site [email or web address] and access using universal  password for that web site. If you do not have the password, please call the hospital operator.  03/11/2023, 9:21 AM

## 2023-03-12 ENCOUNTER — Inpatient Hospital Stay (HOSPITAL_COMMUNITY): Payer: 59

## 2023-03-12 DIAGNOSIS — N179 Acute kidney failure, unspecified: Secondary | ICD-10-CM | POA: Diagnosis not present

## 2023-03-12 DIAGNOSIS — G9341 Metabolic encephalopathy: Secondary | ICD-10-CM | POA: Diagnosis not present

## 2023-03-12 DIAGNOSIS — J189 Pneumonia, unspecified organism: Secondary | ICD-10-CM

## 2023-03-12 DIAGNOSIS — N184 Chronic kidney disease, stage 4 (severe): Secondary | ICD-10-CM

## 2023-03-12 DIAGNOSIS — B20 Human immunodeficiency virus [HIV] disease: Secondary | ICD-10-CM | POA: Diagnosis not present

## 2023-03-12 DIAGNOSIS — E440 Moderate protein-calorie malnutrition: Secondary | ICD-10-CM | POA: Diagnosis not present

## 2023-03-12 LAB — RENAL FUNCTION PANEL
Albumin: 2.8 g/dL — ABNORMAL LOW (ref 3.5–5.0)
Anion gap: 11 (ref 5–15)
BUN: 43 mg/dL — ABNORMAL HIGH (ref 8–23)
CO2: 23 mmol/L (ref 22–32)
Calcium: 8.5 mg/dL — ABNORMAL LOW (ref 8.9–10.3)
Chloride: 101 mmol/L (ref 98–111)
Creatinine, Ser: 4.64 mg/dL — ABNORMAL HIGH (ref 0.61–1.24)
GFR, Estimated: 13 mL/min — ABNORMAL LOW (ref >60)
Glucose, Bld: 147 mg/dL — ABNORMAL HIGH (ref 70–99)
Phosphorus: 4.7 mg/dL — ABNORMAL HIGH (ref 2.5–4.6)
Potassium: 4.5 mmol/L (ref 3.5–5.1)
Sodium: 135 mmol/L (ref 135–145)

## 2023-03-12 LAB — GLUCOSE, CAPILLARY
Glucose-Capillary: 141 mg/dL — ABNORMAL HIGH (ref 70–99)
Glucose-Capillary: 198 mg/dL — ABNORMAL HIGH (ref 70–99)
Glucose-Capillary: 134 mg/dL — ABNORMAL HIGH (ref 70–99)
Glucose-Capillary: 124 mg/dL — ABNORMAL HIGH (ref 70–99)

## 2023-03-12 LAB — CULTURE, BLOOD (ROUTINE X 2): Culture: NO GROWTH

## 2023-03-12 LAB — LIPASE, BLOOD: Lipase: 46 U/L (ref 11–51)

## 2023-03-12 MED ORDER — PANTOPRAZOLE SODIUM 40 MG PO TBEC
40.0000 mg | DELAYED_RELEASE_TABLET | Freq: Every day | ORAL | Status: DC
  Administered 2023-03-12 – 2023-03-23 (×12): 40 mg via ORAL
  Filled 2023-03-12 (×14): qty 1

## 2023-03-12 MED ORDER — BICTEGRAVIR-EMTRICITAB-TENOFOV 50-200-25 MG PO TABS
1.0000 | ORAL_TABLET | Freq: Every day | ORAL | Status: DC
  Administered 2023-03-12 – 2023-03-23 (×12): 1 via ORAL
  Filled 2023-03-12 (×13): qty 1

## 2023-03-12 NOTE — Progress Notes (Signed)
Physical Therapy Treatment Patient Details Name: Michael [name] MRN: [id number] DOB: [birth year] Today's Date: 03/12/2023   History of Present Illness 72 year old male with history of HIV, homelessness, cocaine abuse, PAF, CKD stage IV, CAD status post CABG, IDDM who returns to the hospital on 03/07/23 after a recent admission on 02/14/23  with community-acquired pneumonia as well as suicidal ideation.  This admission pt presents with AMS, chest pain and CKD. He is well-known to our system with frequent ED visits, this being his 12th visit in the past month.    PT Comments    Pt tolerated today's session well but continues to be limited by fatigue. Pt reported dizziness during ambulation today, BP stable throughout, seated rest breaks provided, pt recovering after a few minutes of rest. Pt is motivated to progress mobility, reports this is the weakest he has been. Pt educated on verbalizing symptoms during mobility, often requiring inquiry before pt stating he was dizzy or needed a seated rest break. Discharge recommendations remain appropriate, but frequency increased to help maximize independence and mobility during admission. Acute PT will continue to follow to address deficits.     Recommendations for follow up therapy are one component of a multi-disciplinary discharge planning process, led by the attending physician.  Recommendations may be updated based on patient status, additional functional criteria and insurance authorization.  Follow Up Recommendations  Can patient physically be transported by private vehicle: No    Assistance Recommended at Discharge Frequent or constant Supervision/Assistance  Patient can return home with the following A lot of help with walking and/or transfers;A little help with bathing/dressing/bathroom;Assistance with cooking/housework;Direct supervision/assist for medications management;Assist for transportation;Help with stairs or ramp for entrance;Direct  supervision/assist for financial management   Equipment Recommendations  Rollator (4 wheels)    Recommendations for Other Services       Precautions / Restrictions Precautions Precautions: Fall Restrictions Weight Bearing Restrictions: No     Mobility  Bed Mobility Overal bed mobility: Needs Assistance Bed Mobility: Supine to Sit, Sit to Supine     Supine to sit: Supervision, HOB elevated Sit to supine: Supervision, HOB elevated   General bed mobility comments: supervision for safety, increased time with mild cueing for technique    Transfers Overall transfer level: Needs assistance Equipment used: Rolling walker (2 wheels) Transfers: Sit to/from Stand Sit to Stand: Min assist           General transfer comment: minA to for initial steady with rise, standing from bed x1 trial, from armed chair x2 trials, cued for proper hand placement    Ambulation/Gait Ambulation/Gait assistance: Min assist, Min guard Gait Distance (Feet): 60 Feet (with 2 seated rest breaks) Assistive device: Rolling walker (2 wheels) Gait Pattern/deviations: Step-to pattern, Decreased stride length, Trunk flexed, Narrow base of support, Decreased dorsiflexion - right, Decreased dorsiflexion - left, Drifts right/left Gait velocity: decreased     General Gait Details: absent heel strike bilaterally, decreased gait speed with downward gaze and mild path deviation. No overt LOB but minG to minA provided throughout, pt reporting dizziness with ambulation as well as fatigue, seated rest breaks  required   Stairs             Wheelchair Mobility    Modified Rankin (Stroke Patients Only)       Balance Overall balance assessment: History of Falls, Needs assistance Sitting-balance support: Feet supported, No upper extremity supported Sitting balance-Leahy Scale: Fair     Standing balance support: Reliant on assistive device for balance,  During functional activity, Bilateral upper  extremity supported Standing balance-Leahy Scale: Poor Standing balance comment: reliant on RW, use of back of legs on chair/bed with initial stand                            Cognition Arousal/Alertness: Awake/alert Behavior During Therapy: WFL for tasks assessed/performed Overall Cognitive Status: Impaired/Different from baseline Area of Impairment: Safety/judgement, Awareness, Problem solving, Memory                     Memory: Decreased short-term memory   Safety/Judgement: Decreased awareness of deficits Awareness: Emergent Problem Solving: Slow processing, Requires verbal cues, Decreased initiation General Comments: increased time for processing and command following, cueing for symptoms and notifying when needing a rest break        Exercises      General Comments General comments (skin integrity, edema, etc.): Reports dizziness with ambulation, BP 141/70 during first seated rest break, 130/71 once in standing, 123/77 during second seated rest break, 135/77 once back in supine      Pertinent Vitals/Pain Pain Assessment Pain Assessment: 0-10 Pain Score: 8  Pain Location: abdomen Pain Descriptors / Indicators: Discomfort, Grimacing, Guarding Pain Intervention(s): Limited activity within patient's tolerance, Monitored during session, Repositioned, Patient requesting pain meds-RN notified    Home Living                          Prior Function            PT Goals (current goals can now be found in the care plan section) Acute Rehab PT Goals Patient Stated Goal: none stated PT Goal Formulation: With patient Time For Goal Achievement: 03/22/23 Potential to Achieve Goals: Good Progress towards PT goals: Progressing toward goals    Frequency    Min 4X/week      PT Plan Current plan remains appropriate;Frequency needs to be updated    Co-evaluation              AM-PAC PT "6 Clicks" Mobility   Outcome Measure  Help needed  turning from your back to your side while in a flat bed without using bedrails?: A Little Help needed moving from lying on your back to sitting on the side of a flat bed without using bedrails?: A Little Help needed moving to and from a bed to a chair (including a wheelchair)?: A Little Help needed standing up from a chair using your arms (e.g., wheelchair or bedside chair)?: A Little Help needed to walk in hospital room?: A Lot Help needed climbing 3-5 steps with a railing? : Total 6 Click Score: 15    End of Session Equipment Utilized During Treatment: Gait belt Activity Tolerance: Patient tolerated treatment well;Patient limited by fatigue Patient left: in bed;with call bell/phone within reach;with bed alarm set Nurse Communication: Other (comment);Mobility status (pt abdominal pain) PT Visit Diagnosis: Muscle weakness (generalized) (M62.81);History of falling (Z91.81);Difficulty in walking, not elsewhere classified (R26.2)     Time: 8469-6295 PT Time Calculation (min) (ACUTE ONLY): 30 min  Charges:  $Gait Training: 8-22 mins $Therapeutic Activity: 8-22 mins                     Lindalou Hose, PT DPT Acute Rehabilitation Services Office [phone]    Michael [name] 03/12/2023, 4:43 PM

## 2023-03-12 NOTE — TOC Progression Note (Signed)
Transition of Care [facility]) - Progression Note    Patient Details  Name: Michael [name] MRN: [id number] Date of Birth: July 31, 1951  Transition of [facility]) CM/SW Contact  Mearl Latin, LCSW Phone Number: 03/12/2023, 10:16 AM  Clinical Narrative:    Patient has no SNF bed offers. Continuing to work with therapies.    Expected Discharge Plan: Skilled Nursing Facility Barriers to Discharge: Continued Medical Work up, Homeless with medical needs, Active Substance Use - Placement, SNF Pending bed offer, Insurance Authorization  Expected Discharge Plan and Services                                               Social Determinants of Health (SDOH) Interventions SDOH Screenings   Food Insecurity: Food Insecurity Present (03/07/2023)  Housing: Patient Unable To Answer (03/07/2023)  Recent Concern: Housing - Medium Risk (02/14/2023)  Transportation Needs: Unmet Transportation Needs (03/07/2023)  Utilities: Not At Risk (03/07/2023)  Alcohol Screen: Low Risk  (11/27/2022)  Depression (PHQ2-9): High Risk (11/12/2021)  Financial Resource Strain: Medium Risk (09/16/2018)  Physical Activity: Unknown (04/22/2019)  Social Connections: Socially Isolated (09/16/2018)  Stress: Stress Concern Present (09/16/2018)  Tobacco Use: Low Risk  (03/07/2023)    Readmission Risk Interventions    02/17/2023    1:01 PM 07/05/2022   10:08 AM  Readmission Risk Prevention Plan  Transportation Screening Complete Complete  Medication Review Oceanographer) Complete Complete  PCP or Specialist appointment within 3-5 days of discharge Complete Complete  HRI or Home Care Consult Complete Complete  SW Recovery Care/Counseling Consult Complete Complete  Palliative Care Screening Not Applicable Not Applicable  Skilled Nursing Facility Not Applicable Not Applicable

## 2023-03-12 NOTE — Progress Notes (Signed)
Ok to change [place] to [place] to avoid insufficient meals/food intake issue per Dr. Drue Second.  Ulyses Southward, PharmD, BCIDP, AAHIVP, CPP Infectious Disease Pharmacist 03/12/2023 10:24 AM

## 2023-03-12 NOTE — Progress Notes (Signed)
PROGRESS NOTE        PATIENT DETAILS Name: Michael [name] Age: 72 y.o. Sex: male Date of Birth: 12-17-1950 Admit Date: 03/07/2023 Admitting Physician Jonah Blue, MD AVW:UJWJX, [name], MD  Brief Summary: Patient is a 72 y.o.  male with history of HFpEF, CAD s/p CABG, HTN, DM-2, PAF, COPD, mood disorder, HIV, cocaine use, homelessness-presented with confusion-in the setting of crack cocaine use and progression of CKD.  Patient was subsequently admitted to the hospitalist service.  See below for further details.  Significant events: 5/31>> admit to [facility].  Significant studies: 5/31>> CT head: no acute abnormality. 5/31>>CT C-spine: No fracture 5/31>> CT chest: Mild bronchial thickening/tree-in-bud opacities in the right lower lobe concerning 5/31 >> CT abdomen/pelvis: No evidence for acute abdominal process.  For infectious/inflammatory process.  Significant microbiology data: 5/31>> blood culture: No growth  Procedures: None  Consults: Infectious disease.  Subjective: Lying comfortably in bed-denies any chest pain or shortness of breath.  Complains of some abdominal pain-claims he had BM last night.  Objective: Vitals: Blood pressure (!) 150/85, pulse 97, temperature 98.4 F (36.9 C), temperature source Oral, resp. rate 18, height 5\' 8"  (1.727 m), weight 67.5 kg, SpO2 97 %.   Exam: Gen Exam:Alert awake-not in any distress HEENT:atraumatic, normocephalic Chest: B/L clear to auscultation anteriorly CVS:S1S2 regular Abdomen: Soft but-Diffusely tender this morning. Extremities:no edema Neurology: Non focal Skin: no rash  Pertinent Labs/Radiology:    Latest Ref Rng & Units 03/10/2023    6:22 AM 03/09/2023    3:26 AM 03/08/2023    2:24 AM  CBC  WBC 4.0 - 10.5 K/uL 4.2  4.3  4.7   Hemoglobin 13.0 - 17.0 g/dL 9.1  8.2  8.4   Hematocrit 39.0 - 52.0 % 27.1  25.4  25.9   Platelets 150 - 400 K/uL 118  121  115     Lab Results  Component  Value Date   NA 135 03/12/2023   K 4.5 03/12/2023   CL 101 03/12/2023   CO2 23 03/12/2023     Assessment/Plan: Acute toxic encephalopathy Secondary to crack cocaine use Resolved-completely awake/alert.  CKD 4 with progression to CKD 5 Stable with no indications for acute HD.  Per nephrology-poor HD candidate due to noncompliance homelessness.  Normocytic anemia Secondary to underlying CKD Follow CBC periodically  Abdominal pain Likely due to constipation X-ray abdomen negative for acute abnormalities If continues to have pain-will require repeat reimaging.  Constipation Had BM overnight Continue MiraLAX/senna.  Acute on chronic HFpEF Euvolemic  CAD s/p CABG No anginal symptoms Continue aspirin/Lipitor  PAF Maintaining sinus rhythm Diltiazem Not a candidate for anticoagulation due to homelessness/noncompliance  HTN BP stable with hydralazine/Cardizem  DM-2 (A1c 6.8 on 12/25) CBG stable with SSI  Recent Labs    03/11/23 1609 03/11/23 2139 03/12/23 0741  GLUCAP 160* 128* 141*     HIV Antiretrovirals per ID.  COPD Not in exacerbation Continue bronchodilators  Mood disorder Cymbalta  BMI: Estimated body mass index is 22.63 kg/m as calculated from the following:   Height as of this encounter: 5\' 8"  (1.727 m).   Weight as of this encounter: 67.5 kg.   Code status:   Code Status: Full Code   DVT Prophylaxis: heparin injection 5,000 Units Start: 03/07/23 1400   Family Communication: None at bedside  Disposition Plan: Status is: Inpatient Remains  inpatient appropriate because: Severity of illness   Planned Discharge Destination:Skilled nursing facility   Diet: Diet Order             Diet regular Room service appropriate? Yes with Assist; Fluid consistency: Thin  Diet effective now                     Antimicrobial agents: Anti-infectives (From admission, onward)    Start     Dose/Rate Route Frequency Ordered Stop   03/11/23  1230  dolutegravir-rilpivirine (JULUCA) 50-25 MG per tablet 1 tablet        1 tablet Oral Daily with lunch 03/11/23 1131     03/10/23 1130  bictegravir-emtricitabine-tenofovir AF (BIKTARVY) 50-200-25 MG per tablet 1 tablet  Status:  Discontinued        1 tablet Oral Daily with breakfast 03/10/23 1030 03/11/23 1131   03/08/23 0800  dolutegravir (TIVICAY) tablet 50 mg  Status:  Discontinued        50 mg Oral Daily before lunch 03/07/23 1521 03/10/23 1030   03/07/23 1700  rilpivirine (EDURANT) tablet 25 mg  Status:  Discontinued        25 mg Oral Daily with breakfast 03/07/23 1521 03/10/23 1030   03/07/23 1245  vancomycin (VANCOREADY) IVPB 1500 mg/300 mL        1,500 mg 150 mL/hr over 120 Minutes Intravenous  Once 03/07/23 1243 03/07/23 1639   03/07/23 1230  ceFEPIme (MAXIPIME) 2 g in sodium chloride 0.9 % 100 mL IVPB        2 g 200 mL/hr over 30 Minutes Intravenous  Once 03/07/23 1216 03/07/23 1435   03/07/23 1230  metroNIDAZOLE (FLAGYL) IVPB 500 mg        500 mg 100 mL/hr over 60 Minutes Intravenous  Once 03/07/23 1216 03/07/23 1439        MEDICATIONS: Scheduled Meds:  aspirin  81 mg Oral Daily   atorvastatin  40 mg Oral Daily   diltiazem  180 mg Oral Daily   dolutegravir-rilpivirine  1 tablet Oral Q lunch   DULoxetine  30 mg Oral Daily   feeding supplement (NEPRO CARB STEADY)  237 mL Oral TID BM   fluticasone furoate-vilanterol  1 puff Inhalation Daily   gabapentin  100 mg Oral BID   heparin  5,000 Units Subcutaneous Q8H   hydrALAZINE  75 mg Oral Q8H   insulin aspart  0-6 Units Subcutaneous TID WC   multivitamin  1 tablet Oral QHS   polyethylene glycol  17 g Oral BID   sodium bicarbonate  1,300 mg Oral BID   sodium chloride flush  3 mL Intravenous Q12H   Continuous Infusions: PRN Meds:.acetaminophen **OR** acetaminophen, albuterol, calcium carbonate (dosed in mg elemental calcium), camphor-menthol **AND** hydrOXYzine, docusate sodium, hydrALAZINE, ondansetron **OR**  ondansetron (ZOFRAN) IV, sorbitol, zolpidem   I have personally reviewed following labs and imaging studies  LABORATORY DATA: CBC: Recent Labs  Lab 03/05/23 2004 03/07/23 1022 03/07/23 1029 03/07/23 1101 03/08/23 0224 03/09/23 0326 03/10/23 0622  WBC 5.2 5.5  --   --  4.7 4.3 4.2  NEUTROABS 3.7  --   --   --   --   --   --   HGB 9.3* 9.1* 9.5* 9.2* 8.4* 8.2* 9.1*  HCT 29.5* 29.2* 28.0* 27.0* 25.9* 25.4* 27.1*  MCV 89.4 89.8  --   --  88.7 86.1 86.3  PLT 139* 152  --   --  115* 121* 118*    Basic  Metabolic Panel: Recent Labs  Lab 03/07/23 1050 03/07/23 1101 03/08/23 1135 03/09/23 0326 03/10/23 0622 03/11/23 0417 03/12/23 0555  NA  --    < > 139 138 139 138 135  K  --    < > 4.7 4.1 4.2 4.3 4.5  CL  --    < > 109 107 108 104 101  CO2  --    < > 21* 22 23 24 23   GLUCOSE  --    < > 187* 131* 104* 130* 147*  BUN  --    < > 74* 66* 56* 47* 43*  CREATININE  --    < > 4.74* 4.81* 4.77* 4.57* 4.64*  CALCIUM  --    < > 7.9* 7.6* 7.7* 8.0* 8.5*  MG 1.8  --   --   --   --   --   --   PHOS 5.8*  --  4.5 3.9 3.9 4.2 4.7*   < > = values in this interval not displayed.    GFR: Estimated Creatinine Clearance: 13.7 mL/min (A) (by C-G formula based on SCr of 4.64 mg/dL (H)).  Liver Function Tests: Recent Labs  Lab 03/05/23 2004 03/07/23 1022 03/08/23 1135 03/09/23 0326 03/10/23 0622 03/11/23 0417 03/12/23 0555  AST 26 32  --   --   --   --   --   ALT 61* 51*  --   --   --   --   --   ALKPHOS 92 98  --   --   --   --   --   BILITOT 0.7 0.6  --   --   --   --   --   PROT 7.0 7.2  --   --   --   --   --   ALBUMIN 3.2* 3.3* 2.8* 2.6* 2.5* 2.6* 2.8*   No results for input(s): "LIPASE", "AMYLASE" in the last 168 hours. No results for input(s): "AMMONIA" in the last 168 hours.  Coagulation Profile: No results for input(s): "INR", "PROTIME" in the last 168 hours.  Cardiac Enzymes: Recent Labs  Lab 03/08/23 0224  CKTOTAL 260    BNP (last 3 results) No results for  input(s): "PROBNP" in the last 8760 hours.  Lipid Profile: No results for input(s): "CHOL", "HDL", "LDLCALC", "TRIG", "CHOLHDL", "LDLDIRECT" in the last 72 hours.  Thyroid Function Tests: No results for input(s): "TSH", "T4TOTAL", "FREET4", "T3FREE", "THYROIDAB" in the last 72 hours.  Anemia Panel: No results for input(s): "VITAMINB12", "FOLATE", "FERRITIN", "TIBC", "IRON", "RETICCTPCT" in the last 72 hours.  Urine analysis:    Component Value Date/Time   COLORURINE YELLOW 03/07/2023 1250   APPEARANCEUR CLEAR 03/07/2023 1250   LABSPEC 1.013 03/07/2023 1250   PHURINE 5.0 03/07/2023 1250   GLUCOSEU 50 (A) 03/07/2023 1250   HGBUR MODERATE (A) 03/07/2023 1250   BILIRUBINUR NEGATIVE 03/07/2023 1250   KETONESUR 5 (A) 03/07/2023 1250   PROTEINUR >=300 (A) 03/07/2023 1250   UROBILINOGEN 0.2 08/13/2015 1030   NITRITE NEGATIVE 03/07/2023 1250   LEUKOCYTESUR NEGATIVE 03/07/2023 1250    Sepsis Labs: Lactic Acid, Venous    Component Value Date/Time   LATICACIDVEN 1.2 03/07/2023 1344    MICROBIOLOGY: Recent Results (from the past 240 hour(s))  Blood culture (routine x 2)     Status: None (Preliminary result)   Collection Time: 03/07/23 12:16 PM   Specimen: BLOOD  Result Value Ref Range Status   Specimen Description BLOOD RIGHT ANTECUBITAL  Final  Special Requests   Final    BOTTLES DRAWN AEROBIC AND ANAEROBIC Blood Culture results may not be optimal due to an inadequate volume of blood received in culture bottles   Culture   Final    NO GROWTH 4 DAYS Performed at [facility], [address] [place], [place] [address]    Report Status PENDING  Incomplete  Blood culture (routine x 2)     Status: None (Preliminary result)   Collection Time: 03/07/23 12:21 PM   Specimen: BLOOD RIGHT ARM  Result Value Ref Range Status   Specimen Description BLOOD RIGHT ARM  Final   Special Requests   Final    BOTTLES DRAWN AEROBIC ONLY Blood Culture results may not be optimal due to an  inadequate volume of blood received in culture bottles   Culture   Final    NO GROWTH 4 DAYS Performed at [facility], [address], [name], [place] [address]    Report Status PENDING  Incomplete    RADIOLOGY STUDIES/RESULTS: DG ABD ACUTE 2+V W 1V CHEST  Result Date: 03/12/2023 CLINICAL DATA:  Nausea, vomiting, abdominal pain. EXAM: DG ABDOMEN ACUTE WITH 1 VIEW CHEST COMPARISON:  Mar 05, 2023. FINDINGS: There is no evidence of dilated bowel loops or free intraperitoneal air. Phleboliths are noted in pelvis. Heart size and mediastinal contours are within normal limits. Both lungs are clear. IMPRESSION: No abnormal bowel dilatation.  No acute cardiopulmonary disease. Electronically Signed   By: Lupita Raider M.D.   On: 03/12/2023 08:35     LOS: 4 days   Jeoffrey Massed, MD  [facility]    To contact the attending provider between 7A-7P or the covering provider during after hours 7P-7A, please log into the web site [email or web address] and access using universal [place] password for that web site. If you do not have the password, please call the hospital operator.  03/12/2023, 10:18 AM

## 2023-03-12 NOTE — Plan of Care (Signed)

## 2023-03-12 NOTE — Progress Notes (Signed)
[facility]    Date of Admission:  03/07/2023    ID: Michael [name] is a 72 y.o. male with HIV disease Principal Problem:   Acute metabolic encephalopathy Active Problems:   HIV (human immunodeficiency virus infection) ([name])   Human immunodeficiency virus (HIV) disease ([name])   Hypertension goal BP (blood pressure) < 140/80   Substance induced mood disorder ([name])   Type II diabetes mellitus with renal manifestations ([name])   S/P CABG (coronary artery bypass graft)   Paroxysmal A-fib ([name])   COPD (chronic obstructive pulmonary disease) ([name])   Polysubstance abuse ([name])   CKD (chronic kidney disease), stage IV ([name])   Pure hypercholesterolemia    Subjective: Afebrile, but still feeling poorly this morning. Had multiple bowel movements last night due to miralax to treat constipation thus had poor sleep. Still doesn't feel like he has appetite this morning  Medications:   aspirin  81 mg Oral Daily   atorvastatin  40 mg Oral Daily   bictegravir-emtricitabine-tenofovir AF  1 tablet Oral Q lunch   diltiazem  180 mg Oral Daily   DULoxetine  30 mg Oral Daily   feeding supplement (NEPRO CARB STEADY)  237 mL Oral TID BM   fluticasone furoate-vilanterol  1 puff Inhalation Daily   gabapentin  100 mg Oral BID   heparin  5,000 Units Subcutaneous Q8H   hydrALAZINE  75 mg Oral Q8H   insulin aspart  0-6 Units Subcutaneous TID WC   multivitamin  1 tablet Oral QHS   pantoprazole  40 mg Oral Q1200   polyethylene glycol  17 g Oral BID   sodium bicarbonate  1,300 mg Oral BID   sodium chloride flush  3 mL Intravenous Q12H    Objective: Vital signs in last 24 hours: Temp:  [97.9 F (36.6 C)-98.4 F (36.9 C)] 98.4 F (36.9 C) (06/05 0742) Pulse Rate:  [86-97] 97 (06/05 0010) Resp:  [17-18] 18 (06/05 0010) BP: (148-168)/(84-91) 168/84 (06/05 1237) SpO2:  [97 %-98 %] 97 % (06/05 0737) Weight:  [67.5 kg] 67.5 kg (06/05 0500)  Physical Exam  Constitutional:  He is oriented to person, place, and time. He appears well-developed and well-nourished. No distress.  HENT:  Mouth/Throat: Oropharynx is clear and moist. No oropharyngeal exudate.  Cardiovascular: Normal rate, regular rhythm and normal heart sounds. Exam reveals no gallop and no friction rub.  No murmur heard.  Pulmonary/Chest: Effort normal and breath sounds normal. No respiratory distress. He has no wheezes.  Abdominal: Soft. Bowel sounds are normal. He exhibits no distension. There is no tenderness.  Lymphadenopathy:  He has no cervical adenopathy.  Neurological: He is alert and oriented to person, place, and time.  Skin: Skin is warm and dry. No rash noted. No erythema.  Psychiatric: He has a normal mood and affect. His behavior is normal.    Lab Results Recent Labs    03/10/23 0622 03/11/23 0417 03/12/23 0555  WBC 4.2  --   --   HGB 9.1*  --   --   HCT 27.1*  --   --   NA 139 138 135  K 4.2 4.3 4.5  CL 108 104 101  CO2 23 24 23   BUN 56* 47* 43*  CREATININE 4.77* 4.57* 4.64*   Liver Panel Recent Labs    03/11/23 0417 03/12/23 0555  ALBUMIN 2.6* 2.8*   Sedimentation Rate No results for input(s): "ESRSEDRATE" in the last 72 hours. C-Reactive Protein No results for input(s): "  CRP" in the last 72 hours.  Microbiology: reviewed Studies/Results: DG ABD ACUTE 2+V W 1V CHEST  Result Date: 03/12/2023 CLINICAL DATA:  Nausea, vomiting, abdominal pain. EXAM: DG ABDOMEN ACUTE WITH 1 VIEW CHEST COMPARISON:  Mar 05, 2023. FINDINGS: There is no evidence of dilated bowel loops or free intraperitoneal air. Phleboliths are noted in pelvis. Heart size and mediastinal contours are within normal limits. Both lungs are clear. IMPRESSION: No abnormal bowel dilatation.  No acute cardiopulmonary disease. Electronically Signed   By: Lupita Raider M.D.   On: 03/12/2023 08:35     Assessment/Plan: HIV disease = will change to biktarvy since he has inconsistent dietary intake, and  rilpivirine has caloric intake need.  Moderate protein-caloric malnutrition = will need to continue to track to see if meeting daily nutritional needs  Ckd 4 = stable  [facility] Pager: [phone]  03/12/2023, 2:53 PM

## 2023-03-13 DIAGNOSIS — B20 Human immunodeficiency virus [HIV] disease: Secondary | ICD-10-CM | POA: Diagnosis not present

## 2023-03-13 DIAGNOSIS — N179 Acute kidney failure, unspecified: Secondary | ICD-10-CM | POA: Diagnosis not present

## 2023-03-13 DIAGNOSIS — G9341 Metabolic encephalopathy: Secondary | ICD-10-CM | POA: Diagnosis not present

## 2023-03-13 DIAGNOSIS — J189 Pneumonia, unspecified organism: Secondary | ICD-10-CM | POA: Diagnosis not present

## 2023-03-13 LAB — GLUCOSE, CAPILLARY
Glucose-Capillary: 126 mg/dL — ABNORMAL HIGH (ref 70–99)
Glucose-Capillary: 201 mg/dL — ABNORMAL HIGH (ref 70–99)
Glucose-Capillary: 185 mg/dL — ABNORMAL HIGH (ref 70–99)
Glucose-Capillary: 272 mg/dL — ABNORMAL HIGH (ref 70–99)

## 2023-03-13 LAB — RENAL FUNCTION PANEL
Albumin: 2.6 g/dL — ABNORMAL LOW (ref 3.5–5.0)
Anion gap: 11 (ref 5–15)
BUN: 42 mg/dL — ABNORMAL HIGH (ref 8–23)
CO2: 21 mmol/L — ABNORMAL LOW (ref 22–32)
Calcium: 8.1 mg/dL — ABNORMAL LOW (ref 8.9–10.3)
Chloride: 101 mmol/L (ref 98–111)
Creatinine, Ser: 4.64 mg/dL — ABNORMAL HIGH (ref 0.61–1.24)
GFR, Estimated: 13 mL/min — ABNORMAL LOW (ref >60)
Glucose, Bld: 133 mg/dL — ABNORMAL HIGH (ref 70–99)
Phosphorus: 3.8 mg/dL (ref 2.5–4.6)
Potassium: 4.6 mmol/L (ref 3.5–5.1)
Sodium: 133 mmol/L — ABNORMAL LOW (ref 135–145)

## 2023-03-13 LAB — CBC
HCT: 29.9 % — ABNORMAL LOW (ref 39.0–52.0)
Hemoglobin: 9.7 g/dL — ABNORMAL LOW (ref 13.0–17.0)
MCH: 28.9 pg (ref 26.0–34.0)
MCHC: 32.4 g/dL (ref 30.0–36.0)
MCV: 89 fL (ref 80.0–100.0)
Platelets: UNDETERMINED 10*3/uL (ref 150–400)
RBC: 3.36 MIL/uL — ABNORMAL LOW (ref 4.22–5.81)
RDW: 14.2 % (ref 11.5–15.5)
WBC: 5.1 10*3/uL (ref 4.0–10.5)
nRBC: 0.4 % — ABNORMAL HIGH (ref 0.0–0.2)

## 2023-03-13 MED ORDER — POLYETHYLENE GLYCOL 3350 17 G PO PACK
17.0000 g | PACK | Freq: Three times a day (TID) | ORAL | Status: DC
  Administered 2023-03-13 – 2023-03-14 (×3): 17 g via ORAL
  Filled 2023-03-13 (×4): qty 1

## 2023-03-13 MED ORDER — BISACODYL 10 MG RE SUPP
10.0000 mg | Freq: Once | RECTAL | Status: AC
  Administered 2023-03-13: 10 mg via RECTAL
  Filled 2023-03-13: qty 1

## 2023-03-13 NOTE — Progress Notes (Signed)
Physical Therapy Treatment Patient Details Name: Michael [name] MRN: [id number] DOB: 11-25-1950 Today's Date: 03/13/2023   History of Present Illness 72 year old male with history of HIV, homelessness, cocaine abuse, PAF, CKD stage IV, CAD status post CABG, IDDM who returns to the hospital on 03/07/23 after a recent admission on 02/14/23  with community-acquired pneumonia as well as suicidal ideation.  This admission pt presents with AMS, chest pain and CKD. He is well-known to our system with frequent ED visits, this being his 12th visit in the past month.    PT Comments    Pt greeted resting in bed and agreeable to session, however pt continues to be limited by dizziness, fatigue, and abdominal pain. Pt requiring grossly min A for bed mobility, functional transfers and gait with RW for support. Pt continues to require cues for self monitoring of symptoms and fatigue and encouragement to verbalize symptoms. Pt continues to be motivated for participation despite pain and fatigue and current plan remains appropriate to address deficits and maximize functional independence and decrease caregiver burden. Pt continues to benefit from skilled PT services to progress toward functional mobility goals.      Recommendations for follow up therapy are one component of a multi-disciplinary discharge planning process, led by the attending physician.  Recommendations may be updated based on patient status, additional functional criteria and insurance authorization.  Follow Up Recommendations  Can patient physically be transported by private vehicle: No    Assistance Recommended at Discharge Frequent or constant Supervision/Assistance  Patient can return home with the following A lot of help with walking and/or transfers;A little help with bathing/dressing/bathroom;Assistance with cooking/housework;Direct supervision/assist for medications management;Assist for transportation;Help with stairs or ramp for  entrance;Direct supervision/assist for financial management   Equipment Recommendations  Rollator (4 wheels)    Recommendations for Other Services       Precautions / Restrictions Precautions Precautions: Fall Precaution Comments: 3 falls in past 6 months, " Restrictions Weight Bearing Restrictions: No     Mobility  Bed Mobility Overal bed mobility: Needs Assistance Bed Mobility: Supine to Sit, Sit to Supine     Supine to sit: HOB elevated, Min assist     General bed mobility comments: min A to elevate trunk    Transfers Overall transfer level: Needs assistance Equipment used: Rolling walker (2 wheels) Transfers: Sit to/from Stand Sit to Stand: Min assist           General transfer comment: minA to for initial steady with rise,    Ambulation/Gait Ambulation/Gait assistance: Min assist, Min guard Gait Distance (Feet): 75 Feet Assistive device: Rolling walker (2 wheels) Gait Pattern/deviations: Decreased stride length, Trunk flexed, Narrow base of support, Decreased dorsiflexion - right, Decreased dorsiflexion - left, Drifts right/left, Step-through pattern Gait velocity: decreased     General Gait Details: Decreased gait speed with downward gaze and mild path deviation. No overt LOB but minG to minA provided throughout, pt reporting dizziness with ambulation as well as fatigue at end of session   Stairs             Wheelchair Mobility    Modified Rankin (Stroke Patients Only)       Balance Overall balance assessment: History of Falls, Needs assistance Sitting-balance support: Feet supported, No upper extremity supported Sitting balance-Leahy Scale: Fair     Standing balance support: Reliant on assistive device for balance, During functional activity, Bilateral upper extremity supported Standing balance-Leahy Scale: Poor Standing balance comment: reliant on RW, use of back of  legs on chair/bed with initial stand                             Cognition Arousal/Alertness: Awake/alert Behavior During Therapy: WFL for tasks assessed/performed Overall Cognitive Status: Impaired/Different from baseline Area of Impairment: Safety/judgement, Awareness, Problem solving, Memory                 Orientation Level: Disoriented to, Situation (slightly)   Memory: Decreased short-term memory   Safety/Judgement: Decreased awareness of deficits Awareness: Emergent Problem Solving: Slow processing, Requires verbal cues, Decreased initiation General Comments: increased time for processing and command following, cueing for symptoms and notifying when needing a rest break        Exercises      General Comments General comments (skin integrity, edema, etc.): Reports dizziness throughout session, unchanging and not resolving with positional changes. BP stable      Pertinent Vitals/Pain Pain Assessment Pain Assessment: Faces Faces Pain Scale: Hurts even more Pain Location: abdomen Pain Descriptors / Indicators: Discomfort, Grimacing, Guarding Pain Intervention(s): Monitored during session, Limited activity within patient's tolerance    Home Living                          Prior Function            PT Goals (current goals can now be found in the care plan section) Acute Rehab PT Goals Patient Stated Goal: none stated PT Goal Formulation: With patient Time For Goal Achievement: 03/22/23 Progress towards PT goals: Progressing toward goals    Frequency    Min 4X/week      PT Plan Current plan remains appropriate;Frequency needs to be updated    Co-evaluation              AM-PAC PT "6 Clicks" Mobility   Outcome Measure  Help needed turning from your back to your side while in a flat bed without using bedrails?: A Little Help needed moving from lying on your back to sitting on the side of a flat bed without using bedrails?: A Little Help needed moving to and from a bed to a chair (including  a wheelchair)?: A Little Help needed standing up from a chair using your arms (e.g., wheelchair or bedside chair)?: A Little Help needed to walk in hospital room?: A Lot Help needed climbing 3-5 steps with a railing? : Total 6 Click Score: 15    End of Session Equipment Utilized During Treatment: Gait belt Activity Tolerance: Patient tolerated treatment well;Patient limited by pain;Patient limited by fatigue Patient left: with call bell/phone within reach;in chair Nurse Communication: Mobility status PT Visit Diagnosis: Muscle weakness (generalized) (M62.81);History of falling (Z91.81);Difficulty in walking, not elsewhere classified (R26.2)     Time: 4098-1191 PT Time Calculation (min) (ACUTE ONLY): 20 min  Charges:  $Gait Training: 8-22 mins                     Prisilla Kocsis R. PTA Acute Rehabilitation Services Office: [phone]   Catalina Antigua 03/13/2023, 3:25 PM

## 2023-03-13 NOTE — Progress Notes (Signed)
PROGRESS NOTE        PATIENT DETAILS Name: Michael [name] Age: 72 y.o. Sex: male Date of Birth: 06-04-1951 Admit Date: 03/07/2023 Admitting Physician Jonah Blue, MD ZOX:[id number], [name], MD  Brief Summary: Patient is a 72 y.o.  male with history of HFpEF, CAD s/p CABG, HTN, DM-2, PAF, COPD, mood disorder, HIV, cocaine use, homelessness-presented with confusion-in the setting of crack cocaine use and progression of CKD.  Patient was subsequently admitted to the hospitalist service.  See below for further details.  Significant events: 5/31>> admit to [facility].  Significant studies: 5/31>> CT head: no acute abnormality. 5/31>>CT C-spine: No fracture 5/31>> CT chest: Mild bronchial thickening/tree-in-bud opacities in the right lower lobe concerning 5/31 >> CT abdomen/pelvis: No evidence for acute abdominal process.  For infectious/inflammatory process 6/5>> CT abdomen/pelvis: No acute abnormality-moderate stool burden  Significant microbiology data: 5/31>> blood culture: No growth  Procedures: None  Consults: Infectious disease.  Subjective: Continues to complain of some abdominal pain-had a BM yesterday.    Objective: Vitals: Blood pressure (!) 145/80, pulse 85, temperature 98.5 F (36.9 C), temperature source Oral, resp. rate 16, height 5\' 8"  (1.727 m), weight 68 kg, SpO2 96 %.   Exam: Gen Exam:Alert awake-not in any distress HEENT:atraumatic, normocephalic Chest: B/L clear to auscultation anteriorly CVS:S1S2 regular Abdomen:soft-mildly but diffusely tender. Extremities:no edema Neurology: Non focal Skin: no rash  Pertinent Labs/Radiology:    Latest Ref Rng & Units 03/13/2023    6:03 AM 03/10/2023    6:22 AM 03/09/2023    3:26 AM  CBC  WBC 4.0 - 10.5 K/uL 5.1  4.2  4.3   Hemoglobin 13.0 - 17.0 g/dL 9.7  9.1  8.2   Hematocrit 39.0 - 52.0 % 29.9  27.1  25.4   Platelets 150 - 400 K/uL PLATELET CLUMPS NOTED ON SMEAR, UNABLE TO ESTIMATE  118   121     Lab Results  Component Value Date   NA 133 (L) 03/13/2023   K 4.6 03/13/2023   CL 101 03/13/2023   CO2 21 (L) 03/13/2023     Assessment/Plan: Acute toxic encephalopathy Secondary to crack cocaine use Resolved-completely awake/alert.  CKD 4 with progression to CKD 5 Stable with no indications for acute HD.  Per nephrology-poor HD candidate due to noncompliance homelessness.  Normocytic anemia Secondary to underlying CKD Follow CBC periodically  Abdominal pain Likely due to constipation X-ray abdomen/CT abdomen on 6/5-stable but does show significant constipation See below  Constipation Likely causing above-no response to Dulcolax suppository-trying tapwater enema. Maintain on MiraLAX/senna  Acute on chronic HFpEF Euvolemic  CAD s/p CABG No anginal symptoms Continue aspirin/Lipitor  PAF Maintaining sinus rhythm Diltiazem Not a candidate for anticoagulation due to homelessness/noncompliance  HTN BP stable with hydralazine/Cardizem  DM-2 (A1c 6.8 on 12/25) CBG stable with SSI  Recent Labs    03/12/23 1526 03/12/23 1947 03/13/23 0821  GLUCAP 134* 124* 126*      HIV Antiretrovirals per ID.  COPD Not in exacerbation Continue bronchodilators  Mood disorder Cymbalta  BMI: Estimated body mass index is 22.79 kg/m as calculated from the following:   Height as of this encounter: 5\' 8"  (1.727 m).   Weight as of this encounter: 68 kg.   Code status:   Code Status: Full Code   DVT Prophylaxis: heparin injection 5,000 Units Start: 03/07/23 1400   Family  Communication: None at bedside  Disposition Plan: Status is: Inpatient Remains inpatient appropriate because: Severity of illness   Planned Discharge Destination:Skilled nursing facility   Diet: Diet Order             Diet regular Room service appropriate? Yes with Assist; Fluid consistency: Thin  Diet effective now                     Antimicrobial agents: Anti-infectives  (From admission, onward)    Start     Dose/Rate Route Frequency Ordered Stop   03/12/23 1200  bictegravir-emtricitabine-tenofovir AF (BIKTARVY) 50-200-25 MG per tablet 1 tablet        1 tablet Oral Daily with lunch 03/12/23 1023     03/11/23 1230  dolutegravir-rilpivirine (JULUCA) 50-25 MG per tablet 1 tablet  Status:  Discontinued        1 tablet Oral Daily with lunch 03/11/23 1131 03/12/23 1023   03/10/23 1130  bictegravir-emtricitabine-tenofovir AF (BIKTARVY) 50-200-25 MG per tablet 1 tablet  Status:  Discontinued        1 tablet Oral Daily with breakfast 03/10/23 1030 03/11/23 1131   03/08/23 0800  dolutegravir (TIVICAY) tablet 50 mg  Status:  Discontinued        50 mg Oral Daily before lunch 03/07/23 1521 03/10/23 1030   03/07/23 1700  rilpivirine (EDURANT) tablet 25 mg  Status:  Discontinued        25 mg Oral Daily with breakfast 03/07/23 1521 03/10/23 1030   03/07/23 1245  vancomycin (VANCOREADY) IVPB 1500 mg/300 mL        1,500 mg 150 mL/hr over 120 Minutes Intravenous  Once 03/07/23 1243 03/07/23 1639   03/07/23 1230  ceFEPIme (MAXIPIME) 2 g in sodium chloride 0.9 % 100 mL IVPB        2 g 200 mL/hr over 30 Minutes Intravenous  Once 03/07/23 1216 03/07/23 1435   03/07/23 1230  metroNIDAZOLE (FLAGYL) IVPB 500 mg        500 mg 100 mL/hr over 60 Minutes Intravenous  Once 03/07/23 1216 03/07/23 1439        MEDICATIONS: Scheduled Meds:  aspirin  81 mg Oral Daily   atorvastatin  40 mg Oral Daily   bictegravir-emtricitabine-tenofovir AF  1 tablet Oral Q lunch   diltiazem  180 mg Oral Daily   DULoxetine  30 mg Oral Daily   feeding supplement (NEPRO CARB STEADY)  237 mL Oral TID BM   fluticasone furoate-vilanterol  1 puff Inhalation Daily   gabapentin  100 mg Oral BID   heparin  5,000 Units Subcutaneous Q8H   hydrALAZINE  75 mg Oral Q8H   insulin aspart  0-6 Units Subcutaneous TID WC   multivitamin  1 tablet Oral QHS   pantoprazole  40 mg Oral Q1200   polyethylene glycol  17  g Oral TID   sodium bicarbonate  1,300 mg Oral BID   sodium chloride flush  3 mL Intravenous Q12H   Continuous Infusions: PRN Meds:.acetaminophen **OR** acetaminophen, albuterol, calcium carbonate (dosed in mg elemental calcium), camphor-menthol **AND** hydrOXYzine, docusate sodium, hydrALAZINE, ondansetron **OR** ondansetron (ZOFRAN) IV, sorbitol, zolpidem   I have personally reviewed following labs and imaging studies  LABORATORY DATA: CBC: Recent Labs  Lab 03/07/23 1022 03/07/23 1029 03/07/23 1101 03/08/23 0224 03/09/23 0326 03/10/23 0622 03/13/23 0603  WBC 5.5  --   --  4.7 4.3 4.2 5.1  HGB 9.1*   < > 9.2* 8.4* 8.2* 9.1* 9.7*  HCT  29.2*   < > 27.0* 25.9* 25.4* 27.1* 29.9*  MCV 89.8  --   --  88.7 86.1 86.3 89.0  PLT 152  --   --  115* 121* 118* PLATELET CLUMPS NOTED ON SMEAR, UNABLE TO ESTIMATE   < > = values in this interval not displayed.     Basic Metabolic Panel: Recent Labs  Lab 03/07/23 1050 03/07/23 1101 03/09/23 0326 03/10/23 0622 03/11/23 0417 03/12/23 0555 03/13/23 0603  NA  --    < > 138 139 138 135 133*  K  --    < > 4.1 4.2 4.3 4.5 4.6  CL  --    < > 107 108 104 101 101  CO2  --    < > 22 23 24 23  21*  GLUCOSE  --    < > 131* 104* 130* 147* 133*  BUN  --    < > 66* 56* 47* 43* 42*  CREATININE  --    < > 4.81* 4.77* 4.57* 4.64* 4.64*  CALCIUM  --    < > 7.6* 7.7* 8.0* 8.5* 8.1*  MG 1.8  --   --   --   --   --   --   PHOS 5.8*   < > 3.9 3.9 4.2 4.7* 3.8   < > = values in this interval not displayed.     GFR: Estimated Creatinine Clearance: 13.8 mL/min (A) (by C-G formula based on SCr of 4.64 mg/dL (H)).  Liver Function Tests: Recent Labs  Lab 03/07/23 1022 03/08/23 1135 03/09/23 0326 03/10/23 0622 03/11/23 0417 03/12/23 0555 03/13/23 0603  AST 32  --   --   --   --   --   --   ALT 51*  --   --   --   --   --   --   ALKPHOS 98  --   --   --   --   --   --   BILITOT 0.6  --   --   --   --   --   --   PROT 7.2  --   --   --   --   --    --   ALBUMIN 3.3*   < > 2.6* 2.5* 2.6* 2.8* 2.6*   < > = values in this interval not displayed.    Recent Labs  Lab 03/12/23 1542  LIPASE 46   No results for input(s): "AMMONIA" in the last 168 hours.  Coagulation Profile: No results for input(s): "INR", "PROTIME" in the last 168 hours.  Cardiac Enzymes: Recent Labs  Lab 03/08/23 0224  CKTOTAL 260     BNP (last 3 results) No results for input(s): "PROBNP" in the last 8760 hours.  Lipid Profile: No results for input(s): "CHOL", "HDL", "LDLCALC", "TRIG", "CHOLHDL", "LDLDIRECT" in the last 72 hours.  Thyroid Function Tests: No results for input(s): "TSH", "T4TOTAL", "FREET4", "T3FREE", "THYROIDAB" in the last 72 hours.  Anemia Panel: No results for input(s): "VITAMINB12", "FOLATE", "FERRITIN", "TIBC", "IRON", "RETICCTPCT" in the last 72 hours.  Urine analysis:    Component Value Date/Time   COLORURINE YELLOW 03/07/2023 1250   APPEARANCEUR CLEAR 03/07/2023 1250   LABSPEC 1.013 03/07/2023 1250   PHURINE 5.0 03/07/2023 1250   GLUCOSEU 50 (A) 03/07/2023 1250   HGBUR MODERATE (A) 03/07/2023 1250   BILIRUBINUR NEGATIVE 03/07/2023 1250   KETONESUR 5 (A) 03/07/2023 1250   PROTEINUR >=300 (A) 03/07/2023 1250   UROBILINOGEN 0.2  08/13/2015 1030   NITRITE NEGATIVE 03/07/2023 1250   LEUKOCYTESUR NEGATIVE 03/07/2023 1250    Sepsis Labs: Lactic Acid, Venous    Component Value Date/Time   LATICACIDVEN 1.2 03/07/2023 1344    MICROBIOLOGY: Recent Results (from the past 240 hour(s))  Blood culture (routine x 2)     Status: None   Collection Time: 03/07/23 12:16 PM   Specimen: BLOOD  Result Value Ref Range Status   Specimen Description BLOOD RIGHT ANTECUBITAL  Final   Special Requests   Final    BOTTLES DRAWN AEROBIC AND ANAEROBIC Blood Culture results may not be optimal due to an inadequate volume of blood received in culture bottles   Culture   Final    NO GROWTH 5 DAYS Performed at [facility], [address] [place], [place] [address]    Report Status 03/12/2023 FINAL  Final  Blood culture (routine x 2)     Status: None   Collection Time: 03/07/23 12:21 PM   Specimen: BLOOD RIGHT ARM  Result Value Ref Range Status   Specimen Description BLOOD RIGHT ARM  Final   Special Requests   Final    BOTTLES DRAWN AEROBIC ONLY Blood Culture results may not be optimal due to an inadequate volume of blood received in culture bottles   Culture   Final    NO GROWTH 5 DAYS Performed at [facility], [address], [place], [place] [address]    Report Status 03/12/2023 FINAL  Final    RADIOLOGY STUDIES/RESULTS: CT ABDOMEN PELVIS WO CONTRAST  Result Date: 03/12/2023 CLINICAL DATA:  Acute nonlocalized abdominal pain EXAM: CT ABDOMEN AND PELVIS WITHOUT CONTRAST TECHNIQUE: Multidetector CT imaging of the abdomen and pelvis was performed following the standard protocol without IV contrast. RADIATION DOSE REDUCTION: This exam was performed according to the departmental dose-optimization program which includes automated exposure control, adjustment of the mA and/or kV according to patient size and/or use of iterative reconstruction technique. COMPARISON:  Abdomen radiographs 03/12/2023 and CT chest abdomen and pelvis 03/07/2019 FINDINGS: Lower chest: Bibasilar atelectasis/scarring. Centrilobular micronodules in the right lower lobe, likely infectious/inflammatory. Hepatobiliary: No focal liver abnormality is seen. No gallstones, gallbladder wall thickening, or biliary dilatation. Pancreas: Unchanged 9 mm cystic lesion in the tail of the pancreas (6/54) dating back to 09/02/2018. No pancreatic ductal dilatation or surrounding inflammatory changes. Spleen: Unremarkable. Adrenals/Urinary Tract: Stable adrenal glands. No urinary calculi or hydronephrosis. Unremarkable bladder. Stomach/Bowel: Normal caliber large and small bowel. Colonic diverticulosis without diverticulitis. Moderate colonic stool load. Enteric contrast  is present within the distal small bowel and colon. The appendix is not definitively visualized. No secondary signs of appendicitis. Vascular/Lymphatic: Aortic atherosclerosis. No enlarged abdominal or pelvic lymph nodes. Reproductive: Postsurgical changes from TURP. Other: No free intraperitoneal fluid or air. Musculoskeletal: Thoracolumbar spondylosis. Advanced degenerative arthritis left hip. No acute fracture. Postoperative change about the sacrum. IMPRESSION: 1. No acute abnormality identified in the abdomen or pelvis. 2. Colonic diverticulosis without diverticulitis. Moderate colonic stool load. 3. Centrilobular micronodules in the right lower lobe, likely infectious/inflammatory. Aortic Atherosclerosis (ICD10-I70.0). Electronically Signed   By: Minerva Fester M.D.   On: 03/12/2023 20:31   DG ABD ACUTE 2+V W 1V CHEST  Result Date: 03/12/2023 CLINICAL DATA:  Nausea, vomiting, abdominal pain. EXAM: DG ABDOMEN ACUTE WITH 1 VIEW CHEST COMPARISON:  Mar 05, 2023. FINDINGS: There is no evidence of dilated bowel loops or free intraperitoneal air. Phleboliths are noted in pelvis. Heart size and mediastinal contours are within  normal limits. Both lungs are clear. IMPRESSION: No abnormal bowel dilatation.  No acute cardiopulmonary disease. Electronically Signed   By: Lupita Raider M.D.   On: 03/12/2023 08:35     LOS: 5 days   Jeoffrey Massed, MD  [facility]    To contact the attending provider between 7A-7P or the covering provider during after hours 7P-7A, please log into the web site [email or web address] and access using universal [place] password for that web site. If you do not have the password, please call the hospital operator.  03/13/2023, 11:08 AM

## 2023-03-13 NOTE — Progress Notes (Signed)
Mobility Specialist Progress Note   03/13/23 1700  Mobility  Activity Ambulated with assistance in hallway  Level of Assistance Contact guard assist, steadying assist  Assistive Device Front wheel walker  Distance Ambulated (ft) 60 ft  Activity Response Tolerated well  Mobility Referral Yes  $Mobility charge 1 Mobility  Mobility Specialist Start Time (ACUTE ONLY) 1549  Mobility Specialist Stop Time (ACUTE ONLY) 1605  Mobility Specialist Time Calculation (min) (ACUTE ONLY) 16 min   Received coming out of BR expressing continued abdominal pain but agreeable. Able to walk in hallway w/ a steady gait but as ambulation progressed fatigue increased. Returned back to chair w/ call bell in reach and chair alarm on.    Frederico Hamman Mobility Specialist Please contact via SecureChat or  Rehab office at [phone]

## 2023-03-14 ENCOUNTER — Other Ambulatory Visit (HOSPITAL_COMMUNITY): Payer: Self-pay

## 2023-03-14 DIAGNOSIS — B20 Human immunodeficiency virus [HIV] disease: Secondary | ICD-10-CM | POA: Diagnosis not present

## 2023-03-14 DIAGNOSIS — I480 Paroxysmal atrial fibrillation: Secondary | ICD-10-CM

## 2023-03-14 DIAGNOSIS — G9341 Metabolic encephalopathy: Secondary | ICD-10-CM | POA: Diagnosis not present

## 2023-03-14 DIAGNOSIS — F1910 Other psychoactive substance abuse, uncomplicated: Secondary | ICD-10-CM | POA: Diagnosis not present

## 2023-03-14 LAB — GLUCOSE, CAPILLARY
Glucose-Capillary: 122 mg/dL — ABNORMAL HIGH (ref 70–99)
Glucose-Capillary: 181 mg/dL — ABNORMAL HIGH (ref 70–99)
Glucose-Capillary: 155 mg/dL — ABNORMAL HIGH (ref 70–99)
Glucose-Capillary: 313 mg/dL — ABNORMAL HIGH (ref 70–99)

## 2023-03-14 MED ORDER — BIKTARVY 50-200-25 MG PO TABS
1.0000 | ORAL_TABLET | Freq: Every day | ORAL | 2 refills | Status: DC
  Filled 2023-03-14: qty 30, 30d supply, fill #0

## 2023-03-14 MED ORDER — LACTULOSE 10 GM/15ML PO SOLN
30.0000 g | Freq: Two times a day (BID) | ORAL | Status: AC
  Administered 2023-03-14 (×2): 30 g via ORAL
  Filled 2023-03-14 (×2): qty 60

## 2023-03-14 NOTE — Progress Notes (Signed)
Physical Therapy Treatment Patient Details Name: Michael [name] MRN: [id number] DOB: [birth year] Today's Date: 03/14/2023   History of Present Illness 72 year old male with history of HIV, homelessness, cocaine abuse, PAF, CKD stage IV, CAD status post CABG, IDDM who returns to the hospital on 03/07/23 after a recent admission on 02/14/23  with community-acquired pneumonia as well as suicidal ideation.  This admission pt presents with AMS, chest pain and CKD. He is well-known to our system with frequent ED visits, this being his 12th visit in the past month.    PT Comments    Pt tolerated today's session well but continues to be limited by fatigue with mobility as well as abdominal pain. Pt slowly progressing his ambulation distance with each session but continuing to require minG to minA with RW due to imbalance and for proper use of RW. Pt also requiring cues for self monitoring of symptoms and when he needs rest breaks, chair follow provided today but pt able to identify when he needed to return to the room and safely make it back to the recliner without a seated rest break. Pt remains motivated to progress to his PLOF, discharge recommendations remain appropriate. Acute PT will continue to follow pt to maximize independence with mobility and continue to address deficits.     Recommendations for follow up therapy are one component of a multi-disciplinary discharge planning process, led by the attending physician.  Recommendations may be updated based on patient status, additional functional criteria and insurance authorization.  Follow Up Recommendations  Can patient physically be transported by private vehicle: No    Assistance Recommended at Discharge Frequent or constant Supervision/Assistance  Patient can return home with the following A lot of help with walking and/or transfers;A little help with bathing/dressing/bathroom;Assistance with cooking/housework;Direct supervision/assist for  medications management;Assist for transportation;Help with stairs or ramp for entrance;Direct supervision/assist for financial management   Equipment Recommendations  Rollator (4 wheels)    Recommendations for Other Services       Precautions / Restrictions Precautions Precautions: Fall Precaution Comments: 3 falls in past 6 months Restrictions Weight Bearing Restrictions: No     Mobility  Bed Mobility               General bed mobility comments: pt seated in chair upon arrival, ended session with pt in chair    Transfers Overall transfer level: Needs assistance Equipment used: Rolling walker (2 wheels) Transfers: Sit to/from Stand Sit to Stand: Min assist           General transfer comment: minA for steady with rise, cued for proper hand placement    Ambulation/Gait Ambulation/Gait assistance: Min assist, Min guard Gait Distance (Feet): 80 Feet Assistive device: Rolling walker (2 wheels) Gait Pattern/deviations: Decreased stride length, Trunk flexed, Narrow base of support, Decreased dorsiflexion - right, Decreased dorsiflexion - left, Drifts right/left, Step-through pattern Gait velocity: decreased     General Gait Details: cueing for upright posture and forward gaze, mild cues for RW management around obstacles and with 90 and 180 degree turns. No overt LOB but minG to minA provided due to imbalance. Pt reports increasing fatigue with ambulation but no seated rest break needed during ambulation trial   Stairs             Wheelchair Mobility    Modified Rankin (Stroke Patients Only)       Balance Overall balance assessment: History of Falls, Needs assistance Sitting-balance support: Feet supported, No upper extremity supported Sitting balance-Leahy Scale: Fair  Standing balance support: Reliant on assistive device for balance, During functional activity, Bilateral upper extremity supported Standing balance-Leahy Scale: Poor Standing  balance comment: reliant on RW                            Cognition Arousal/Alertness: Awake/alert Behavior During Therapy: WFL for tasks assessed/performed Overall Cognitive Status: Impaired/Different from baseline Area of Impairment: Safety/judgement, Awareness, Problem solving, Memory                     Memory: Decreased short-term memory   Safety/Judgement: Decreased awareness of deficits Awareness: Emergent Problem Solving: Slow processing, Requires verbal cues, Decreased initiation General Comments: increased time for processing and command following, cueing for symptoms and notifying when needing a rest break        Exercises      General Comments General comments (skin integrity, edema, etc.): Reports increased fatigue after ambulation, BP 149/83      Pertinent Vitals/Pain Pain Assessment Pain Assessment: Faces Faces Pain Scale: Hurts little more Pain Location: abdomen and BLE Pain Descriptors / Indicators: Discomfort, Grimacing, Guarding Pain Intervention(s): Limited activity within patient's tolerance, Monitored during session, Repositioned    Home Living                          Prior Function            PT Goals (current goals can now be found in the care plan section) Acute Rehab PT Goals Patient Stated Goal: get stronger PT Goal Formulation: With patient Time For Goal Achievement: 03/22/23 Potential to Achieve Goals: Good Progress towards PT goals: Progressing toward goals    Frequency    Min 4X/week      PT Plan Current plan remains appropriate;Frequency needs to be updated    Co-evaluation              AM-PAC PT "6 Clicks" Mobility   Outcome Measure  Help needed turning from your back to your side while in a flat bed without using bedrails?: A Little Help needed moving from lying on your back to sitting on the side of a flat bed without using bedrails?: A Little Help needed moving to and from a bed  to a chair (including a wheelchair)?: A Little Help needed standing up from a chair using your arms (e.g., wheelchair or bedside chair)?: A Little Help needed to walk in hospital room?: A Little Help needed climbing 3-5 steps with a railing? : Total 6 Click Score: 16    End of Session Equipment Utilized During Treatment: Gait belt Activity Tolerance: Patient tolerated treatment well;Patient limited by fatigue Patient left: with call bell/phone within reach;in chair;with chair alarm set Nurse Communication: Mobility status PT Visit Diagnosis: Muscle weakness (generalized) (M62.81);History of falling (Z91.81);Difficulty in walking, not elsewhere classified (R26.2)     Time: 7829-5621 PT Time Calculation (min) (ACUTE ONLY): 16 min  Charges:  $Gait Training: 8-22 mins                     Lindalou Hose, PT DPT Acute Rehabilitation Services Office [phone]    Leonie Man 03/14/2023, 5:12 PM

## 2023-03-14 NOTE — TOC Progression Note (Signed)
Transition of Care [facility]) - Progression Note    Patient Details  Name: Michael [name] MRN: [id number] Date of Birth: 13-Jun-1951  Transition of Care [facility]) CM/SW Contact  Mearl Latin, LCSW Phone Number: 03/14/2023, 8:53 AM  Clinical Narrative:    Patient has no SNF bed offers. Continuing to work with therapies.    Expected Discharge Plan: Skilled Nursing Facility Barriers to Discharge: Continued Medical Work up, Homeless with medical needs, Active Substance Use - Placement, SNF Pending bed offer, Insurance Authorization  Expected Discharge Plan and Services                                               Social Determinants of Health (SDOH) Interventions SDOH Screenings   Food Insecurity: Food Insecurity Present (03/07/2023)  Housing: Patient Unable To Answer (03/07/2023)  Recent Concern: Housing - Medium Risk (02/14/2023)  Transportation Needs: Unmet Transportation Needs (03/07/2023)  Utilities: Not At Risk (03/07/2023)  Alcohol Screen: Low Risk  (11/27/2022)  Depression (PHQ2-9): High Risk (11/12/2021)  Financial Resource Strain: Medium Risk (09/16/2018)  Physical Activity: Unknown (04/22/2019)  Social Connections: Socially Isolated (09/16/2018)  Stress: Stress Concern Present (09/16/2018)  Tobacco Use: Low Risk  (03/07/2023)    Readmission Risk Interventions    02/17/2023    1:01 PM 07/05/2022   10:08 AM  Readmission Risk Prevention Plan  Transportation Screening Complete Complete  Medication Review Oceanographer) Complete Complete  PCP or Specialist appointment within 3-5 days of discharge Complete Complete  HRI or Home Care Consult Complete Complete  SW Recovery Care/Counseling Consult Complete Complete  Palliative Care Screening Not Applicable Not Applicable  Skilled Nursing Facility Not Applicable Not Applicable

## 2023-03-14 NOTE — TOC Progression Note (Signed)
Transition of Care [facility]) - Progression Note    Patient Details  Name: Michael [name] MRN: [id number] Date of Birth: 1951-03-10  Transition of Care [facility]) CM/SW Contact  Mearl Latin, LCSW Phone Number: 03/14/2023, 3:48 PM  Clinical Narrative:    CSW met with patient. Patient very pleasant and soft-spoken. He reported having family but cannot stay with them. He stated he is interested in any help the hospital can give as he does not want to return to the [facility] to sleep on the floor. CSW explained barriers and provided community resources for housing and substance use facilities. Barrier for that is that he needs to be able to ambulate some distances and sometimes household chores are required. Patient received SSI and stated he has a new debit card in his mailbox at the [facility] so on day of discharge he would need to go there (they open mailboxes at 9am and 1pm). He stated he would rather go to a hotel than back to the [facility]. He does not have a phone.  CSW staffed case with Tammy, liaison for [facility] and unfortunately they do have a denial policy for active cocaine users.    Expected Discharge Plan: Skilled Nursing Facility Barriers to Discharge: Continued Medical Work up, Homeless with medical needs, Active Substance Use - Placement, SNF Pending bed offer, Insurance Authorization  Expected Discharge Plan and Services                                               Social Determinants of Health (SDOH) Interventions SDOH Screenings   Food Insecurity: Food Insecurity Present (03/07/2023)  Housing: Patient Unable To Answer (03/07/2023)  Recent Concern: Housing - Medium Risk (02/14/2023)  Transportation Needs: Unmet Transportation Needs (03/07/2023)  Utilities: Not At Risk (03/07/2023)  Alcohol Screen: Low Risk  (11/27/2022)  Depression (PHQ2-9): High Risk (11/12/2021)  Financial Resource Strain: Medium Risk (09/16/2018)  Physical Activity: Unknown (04/22/2019)  Social  Connections: Socially Isolated (09/16/2018)  Stress: Stress Concern Present (09/16/2018)  Tobacco Use: Low Risk  (03/07/2023)    Readmission Risk Interventions    03/14/2023    3:46 PM 02/17/2023    1:01 PM 07/05/2022   10:08 AM  Readmission Risk Prevention Plan  Transportation Screening Complete Complete Complete  Medication Review Oceanographer) Complete Complete Complete  PCP or Specialist appointment within 3-5 days of discharge Complete Complete Complete  HRI or Home Care Consult Complete Complete Complete  SW Recovery Care/Counseling Consult Complete Complete Complete  Palliative Care Screening Not Applicable Not Applicable Not Applicable  Skilled Nursing Facility Complete Not Applicable Not Applicable

## 2023-03-14 NOTE — Plan of Care (Signed)

## 2023-03-14 NOTE — Progress Notes (Signed)
PROGRESS NOTE        PATIENT DETAILS Name: Michael [name] Age: 72 y.o. Sex: male Date of Birth: October 29, 1950 Admit Date: 03/07/2023 Admitting Physician Jonah Blue, MD ZOX:[id number], [name], MD  Brief Summary: Patient is a 72 y.o.  male with history of HFpEF, CAD s/p CABG, HTN, DM-2, PAF, COPD, mood disorder, HIV, cocaine use, homelessness-presented with confusion-in the setting of crack cocaine use and progression of CKD.  Patient was subsequently admitted to the hospitalist service.  See below for further details.  Significant events: 5/31>> admit to [facility].  Significant studies: 5/31>> CT head: no acute abnormality. 5/31>>CT C-spine: No fracture 5/31>> CT chest: Mild bronchial thickening/tree-in-bud opacities in the right lower lobe concerning 5/31 >> CT abdomen/pelvis: No evidence for acute abdominal process.  For infectious/inflammatory process 6/5>> CT abdomen/pelvis: No acute abnormality-moderate stool burden  Significant microbiology data: 5/31>> blood culture: No growth  Procedures: None  Consults: Infectious disease.  Subjective: 2 small BMs yesterday.  Today he claims that he has chronic abdominal pain ongoing for the past several years.  Current abdominal pain is similar to his chronic pain.  Objective: Vitals: Blood pressure (!) 148/71, pulse 83, temperature 98 F (36.7 C), temperature source Oral, resp. rate 16, height 5\' 8"  (1.727 m), weight 66.2 kg, SpO2 98 %.   Exam: Gen Exam:Alert awake-not in any distress HEENT:atraumatic, normocephalic Chest: B/L clear to auscultation anteriorly CVS:S1S2 regular Abdomen: Soft-mildly but diffusely tender. Extremities:no edema Neurology: Non focal Skin: no rash  Pertinent Labs/Radiology:    Latest Ref Rng & Units 03/13/2023    6:03 AM 03/10/2023    6:22 AM 03/09/2023    3:26 AM  CBC  WBC 4.0 - 10.5 K/uL 5.1  4.2  4.3   Hemoglobin 13.0 - 17.0 g/dL 9.7  9.1  8.2   Hematocrit 39.0 - 52.0 %  29.9  27.1  25.4   Platelets 150 - 400 K/uL PLATELET CLUMPS NOTED ON SMEAR, UNABLE TO ESTIMATE  118  121     Lab Results  Component Value Date   NA 133 (L) 03/13/2023   K 4.6 03/13/2023   CL 101 03/13/2023   CO2 21 (L) 03/13/2023     Assessment/Plan: Acute toxic encephalopathy Secondary to crack cocaine use Resolved-completely awake/alert.  CKD 4 with progression to CKD 5 Stable with no indications for acute HD.  Per nephrology-poor HD candidate due to noncompliance homelessness.  Normocytic anemia Secondary to underlying CKD Follow CBC periodically  Abdominal pain Likely slightly worse due to constipation-but appears to be a chronic issue for patient-ongoing for the past several years Continue to treat underlying constipation-continue serial abdominal exams.  Constipation Likely causing worsening abdominal pain. Small BM yesterday following enema Continue MiraLAX/senna-lactulose x 2 doses today  Acute on chronic HFpEF Euvolemic  CAD s/p CABG No anginal symptoms Continue aspirin/Lipitor  PAF Maintaining sinus rhythm Diltiazem Not a candidate for anticoagulation due to homelessness/noncompliance  HTN BP stable with hydralazine/Cardizem  DM-2 (A1c 6.8 on 12/25) CBG stable with SSI  Recent Labs    03/13/23 1514 03/13/23 2039 03/14/23 0752  GLUCAP 185* 272* 122*      HIV Antiretrovirals per ID.  COPD Not in exacerbation Continue bronchodilators  Mood disorder Cymbalta  BMI: Estimated body mass index is 22.19 kg/m as calculated from the following:   Height as of this encounter: 5\' 8"  (1.727  m).   Weight as of this encounter: 66.2 kg.   Code status:   Code Status: Full Code   DVT Prophylaxis: heparin injection 5,000 Units Start: 03/07/23 1400   Family Communication: None at bedside  Disposition Plan: Status is: Inpatient Remains inpatient appropriate because: Severity of illness   Planned Discharge Destination:Skilled nursing  facility   Diet: Diet Order             Diet regular Room service appropriate? Yes with Assist; Fluid consistency: Thin  Diet effective now                     Antimicrobial agents: Anti-infectives (From admission, onward)    Start     Dose/Rate Route Frequency Ordered Stop   03/12/23 1200  bictegravir-emtricitabine-tenofovir AF (BIKTARVY) 50-200-25 MG per tablet 1 tablet        1 tablet Oral Daily with lunch 03/12/23 1023     03/11/23 1230  dolutegravir-rilpivirine (JULUCA) 50-25 MG per tablet 1 tablet  Status:  Discontinued        1 tablet Oral Daily with lunch 03/11/23 1131 03/12/23 1023   03/10/23 1130  bictegravir-emtricitabine-tenofovir AF (BIKTARVY) 50-200-25 MG per tablet 1 tablet  Status:  Discontinued        1 tablet Oral Daily with breakfast 03/10/23 1030 03/11/23 1131   03/08/23 0800  dolutegravir (TIVICAY) tablet 50 mg  Status:  Discontinued        50 mg Oral Daily before lunch 03/07/23 1521 03/10/23 1030   03/07/23 1700  rilpivirine (EDURANT) tablet 25 mg  Status:  Discontinued        25 mg Oral Daily with breakfast 03/07/23 1521 03/10/23 1030   03/07/23 1245  vancomycin (VANCOREADY) IVPB 1500 mg/300 mL        1,500 mg 150 mL/hr over 120 Minutes Intravenous  Once 03/07/23 1243 03/07/23 1639   03/07/23 1230  ceFEPIme (MAXIPIME) 2 g in sodium chloride 0.9 % 100 mL IVPB        2 g 200 mL/hr over 30 Minutes Intravenous  Once 03/07/23 1216 03/07/23 1435   03/07/23 1230  metroNIDAZOLE (FLAGYL) IVPB 500 mg        500 mg 100 mL/hr over 60 Minutes Intravenous  Once 03/07/23 1216 03/07/23 1439        MEDICATIONS: Scheduled Meds:  aspirin  81 mg Oral Daily   atorvastatin  40 mg Oral Daily   bictegravir-emtricitabine-tenofovir AF  1 tablet Oral Q lunch   diltiazem  180 mg Oral Daily   DULoxetine  30 mg Oral Daily   feeding supplement (NEPRO CARB STEADY)  237 mL Oral TID BM   fluticasone furoate-vilanterol  1 puff Inhalation Daily   gabapentin  100 mg Oral BID    heparin  5,000 Units Subcutaneous Q8H   hydrALAZINE  75 mg Oral Q8H   insulin aspart  0-6 Units Subcutaneous TID WC   multivitamin  1 tablet Oral QHS   pantoprazole  40 mg Oral Q1200   polyethylene glycol  17 g Oral TID   sodium bicarbonate  1,300 mg Oral BID   sodium chloride flush  3 mL Intravenous Q12H   Continuous Infusions: PRN Meds:.acetaminophen **OR** acetaminophen, albuterol, calcium carbonate (dosed in mg elemental calcium), camphor-menthol **AND** hydrOXYzine, docusate sodium, hydrALAZINE, ondansetron **OR** ondansetron (ZOFRAN) IV, sorbitol, zolpidem   I have personally reviewed following labs and imaging studies  LABORATORY DATA: CBC: Recent Labs  Lab 03/07/23 1022 03/07/23 1029 03/07/23 1101 03/08/23  1610 03/09/23 0326 03/10/23 0622 03/13/23 0603  WBC 5.5  --   --  4.7 4.3 4.2 5.1  HGB 9.1*   < > 9.2* 8.4* 8.2* 9.1* 9.7*  HCT 29.2*   < > 27.0* 25.9* 25.4* 27.1* 29.9*  MCV 89.8  --   --  88.7 86.1 86.3 89.0  PLT 152  --   --  115* 121* 118* PLATELET CLUMPS NOTED ON SMEAR, UNABLE TO ESTIMATE   < > = values in this interval not displayed.     Basic Metabolic Panel: Recent Labs  Lab 03/07/23 1050 03/07/23 1101 03/09/23 0326 03/10/23 0622 03/11/23 0417 03/12/23 0555 03/13/23 0603  NA  --    < > 138 139 138 135 133*  K  --    < > 4.1 4.2 4.3 4.5 4.6  CL  --    < > 107 108 104 101 101  CO2  --    < > 22 23 24 23  21*  GLUCOSE  --    < > 131* 104* 130* 147* 133*  BUN  --    < > 66* 56* 47* 43* 42*  CREATININE  --    < > 4.81* 4.77* 4.57* 4.64* 4.64*  CALCIUM  --    < > 7.6* 7.7* 8.0* 8.5* 8.1*  MG 1.8  --   --   --   --   --   --   PHOS 5.8*   < > 3.9 3.9 4.2 4.7* 3.8   < > = values in this interval not displayed.     GFR: Estimated Creatinine Clearance: 13.5 mL/min (A) (by C-G formula based on SCr of 4.64 mg/dL (H)).  Liver Function Tests: Recent Labs  Lab 03/07/23 1022 03/08/23 1135 03/09/23 0326 03/10/23 0622 03/11/23 0417 03/12/23 0555  03/13/23 0603  AST 32  --   --   --   --   --   --   ALT 51*  --   --   --   --   --   --   ALKPHOS 98  --   --   --   --   --   --   BILITOT 0.6  --   --   --   --   --   --   PROT 7.2  --   --   --   --   --   --   ALBUMIN 3.3*   < > 2.6* 2.5* 2.6* 2.8* 2.6*   < > = values in this interval not displayed.    Recent Labs  Lab 03/12/23 1542  LIPASE 46    No results for input(s): "AMMONIA" in the last 168 hours.  Coagulation Profile: No results for input(s): "INR", "PROTIME" in the last 168 hours.  Cardiac Enzymes: Recent Labs  Lab 03/08/23 0224  CKTOTAL 260     BNP (last 3 results) No results for input(s): "PROBNP" in the last 8760 hours.  Lipid Profile: No results for input(s): "CHOL", "HDL", "LDLCALC", "TRIG", "CHOLHDL", "LDLDIRECT" in the last 72 hours.  Thyroid Function Tests: No results for input(s): "TSH", "T4TOTAL", "FREET4", "T3FREE", "THYROIDAB" in the last 72 hours.  Anemia Panel: No results for input(s): "VITAMINB12", "FOLATE", "FERRITIN", "TIBC", "IRON", "RETICCTPCT" in the last 72 hours.  Urine analysis:    Component Value Date/Time   COLORURINE YELLOW 03/07/2023 1250   APPEARANCEUR CLEAR 03/07/2023 1250   LABSPEC 1.013 03/07/2023 1250   PHURINE 5.0 03/07/2023 1250   GLUCOSEU  50 (A) 03/07/2023 1250   HGBUR MODERATE (A) 03/07/2023 1250   BILIRUBINUR NEGATIVE 03/07/2023 1250   KETONESUR 5 (A) 03/07/2023 1250   PROTEINUR >=300 (A) 03/07/2023 1250   UROBILINOGEN 0.2 08/13/2015 1030   NITRITE NEGATIVE 03/07/2023 1250   LEUKOCYTESUR NEGATIVE 03/07/2023 1250    Sepsis Labs: Lactic Acid, Venous    Component Value Date/Time   LATICACIDVEN 1.2 03/07/2023 1344    MICROBIOLOGY: Recent Results (from the past 240 hour(s))  Blood culture (routine x 2)     Status: None   Collection Time: 03/07/23 12:16 PM   Specimen: BLOOD  Result Value Ref Range Status   Specimen Description BLOOD RIGHT ANTECUBITAL  Final   Special Requests   Final    BOTTLES  DRAWN AEROBIC AND ANAEROBIC Blood Culture results may not be optimal due to an inadequate volume of blood received in culture bottles   Culture   Final    NO GROWTH 5 DAYS Performed at [facility], [address], [place], [place] [address]    Report Status 03/12/2023 FINAL  Final  Blood culture (routine x 2)     Status: None   Collection Time: 03/07/23 12:21 PM   Specimen: BLOOD RIGHT ARM  Result Value Ref Range Status   Specimen Description BLOOD RIGHT ARM  Final   Special Requests   Final    BOTTLES DRAWN AEROBIC ONLY Blood Culture results may not be optimal due to an inadequate volume of blood received in culture bottles   Culture   Final    NO GROWTH 5 DAYS Performed at [facility], [address], [place], [place] [address]    Report Status 03/12/2023 FINAL  Final    RADIOLOGY STUDIES/RESULTS: CT ABDOMEN PELVIS WO CONTRAST  Result Date: 03/12/2023 CLINICAL DATA:  Acute nonlocalized abdominal pain EXAM: CT ABDOMEN AND PELVIS WITHOUT CONTRAST TECHNIQUE: Multidetector CT imaging of the abdomen and pelvis was performed following the standard protocol without IV contrast. RADIATION DOSE REDUCTION: This exam was performed according to the departmental dose-optimization program which includes automated exposure control, adjustment of the mA and/or kV according to patient size and/or use of iterative reconstruction technique. COMPARISON:  Abdomen radiographs 03/12/2023 and CT chest abdomen and pelvis 03/07/2019 FINDINGS: Lower chest: Bibasilar atelectasis/scarring. Centrilobular micronodules in the right lower lobe, likely infectious/inflammatory. Hepatobiliary: No focal liver abnormality is seen. No gallstones, gallbladder wall thickening, or biliary dilatation. Pancreas: Unchanged 9 mm cystic lesion in the tail of the pancreas (6/54) dating back to 09/02/2018. No pancreatic ductal dilatation or surrounding inflammatory changes. Spleen: Unremarkable. Adrenals/Urinary Tract: Stable  adrenal glands. No urinary calculi or hydronephrosis. Unremarkable bladder. Stomach/Bowel: Normal caliber large and small bowel. Colonic diverticulosis without diverticulitis. Moderate colonic stool load. Enteric contrast is present within the distal small bowel and colon. The appendix is not definitively visualized. No secondary signs of appendicitis. Vascular/Lymphatic: Aortic atherosclerosis. No enlarged abdominal or pelvic lymph nodes. Reproductive: Postsurgical changes from TURP. Other: No free intraperitoneal fluid or air. Musculoskeletal: Thoracolumbar spondylosis. Advanced degenerative arthritis left hip. No acute fracture. Postoperative change about the sacrum. IMPRESSION: 1. No acute abnormality identified in the abdomen or pelvis. 2. Colonic diverticulosis without diverticulitis. Moderate colonic stool load. 3. Centrilobular micronodules in the right lower lobe, likely infectious/inflammatory. Aortic Atherosclerosis (ICD10-I70.0). Electronically Signed   By: Minerva Fester M.D.   On: 03/12/2023 20:31     LOS: 6 days   Jeoffrey Massed, MD  [facility]    To contact the attending provider  between 7A-7P or the covering provider during after hours 7P-7A, please log into the web site [email or web address] and access using universal [place] password for that web site. If you do not have the password, please call the hospital operator.  03/14/2023, 9:46 AM

## 2023-03-14 NOTE — Progress Notes (Signed)
[facility]  Date of Admission:  03/07/2023     Total days of antibiotics 1         ASSESSMENT:  [name] continues to have generalized abdominal pain likely related to constipation with CT abdomen otherwise unremarkable. Tolerating Biktarvy with no adverse side effects. Creatinine clearance calculated at 13.5 and will need to continue to monitor renal function. Continue current dose of Biktarvy with remaining medical and supportive care per Internal Medicine.   PLAN:  Continue current dose of BIktarvy. Remaining medical and supportive care per Internal Medicine.   I have personally spent 22 minutes involved in face-to-face and non-face-to-face activities for this patient on the day of the visit. Professional time spent includes the following activities: Preparing to see the patient (review of tests), Obtaining and/or reviewing separately obtained history (admission/discharge record), Performing a medically appropriate examination and/or evaluation , Ordering medications/tests/procedures, referring and communicating with other health care professionals, Documenting clinical information in the EMR, Independently interpreting results (not separately reported), Communicating results to the patient/family/caregiver, Counseling and educating the patient/family/caregiver and Care coordination (not separately reported).   Principal Problem:   Acute metabolic encephalopathy Active Problems:   HIV (human immunodeficiency virus infection) ([name])   Human immunodeficiency virus (HIV) disease ([name])   Hypertension goal BP (blood pressure) < 140/80   Substance induced mood disorder ([name])   Type II diabetes mellitus with renal manifestations ([name])   S/P CABG (coronary artery bypass graft)   Paroxysmal A-fib ([name])   COPD (chronic obstructive pulmonary disease) ([name])   Polysubstance abuse ([name])   CKD (chronic kidney disease), stage IV ([name])   Pure  hypercholesterolemia    aspirin  81 mg Oral Daily   atorvastatin  40 mg Oral Daily   bictegravir-emtricitabine-tenofovir AF  1 tablet Oral Q lunch   diltiazem  180 mg Oral Daily   DULoxetine  30 mg Oral Daily   feeding supplement (NEPRO CARB STEADY)  237 mL Oral TID BM   fluticasone furoate-vilanterol  1 puff Inhalation Daily   gabapentin  100 mg Oral BID   heparin  5,000 Units Subcutaneous Q8H   hydrALAZINE  75 mg Oral Q8H   insulin aspart  0-6 Units Subcutaneous TID WC   lactulose  30 g Oral BID   multivitamin  1 tablet Oral QHS   pantoprazole  40 mg Oral Q1200   polyethylene glycol  17 g Oral TID   sodium bicarbonate  1,300 mg Oral BID   sodium chloride flush  3 mL Intravenous Q12H    SUBJECTIVE:  Afebrile overnight with no acute events. Continues to have generalized abdominal pains. Still with decreased appetite and limited oral intake.   No Known Allergies   Review of Systems: Review of Systems  Constitutional:  Negative for chills, fever and weight loss.  Respiratory:  Negative for cough, shortness of breath and wheezing.   Cardiovascular:  Negative for chest pain and leg swelling.  Gastrointestinal:  Positive for abdominal pain. Negative for constipation, diarrhea, nausea and vomiting.  Skin:  Negative for rash.      OBJECTIVE: Vitals:   03/14/23 0015 03/14/23 0300 03/14/23 0753 03/14/23 1200  BP:  137/80 (!) 148/71 (!) 157/77  Pulse: 80 79 83 74  Resp: 18 15 16 16   Temp: 98.1 F (36.7 C) 97.9 F (36.6 C) 98 F (36.7 C) 97.9 F (36.6 C)  TempSrc: Oral Oral Oral Oral  SpO2:  98%  98%  Weight:  66.2 kg  Height:       Body mass index is 22.19 kg/m.  Physical Exam Constitutional:      General: He is not in acute distress.    Appearance: He is well-developed.  Cardiovascular:     Rate and Rhythm: Normal rate and regular rhythm.     Heart sounds: Normal heart sounds.  Pulmonary:     Effort: Pulmonary effort is normal.     Breath sounds: Normal  breath sounds.  Abdominal:     General: Bowel sounds are normal.     Palpations: Abdomen is soft. There is no mass.     Tenderness: There is abdominal tenderness.  Skin:    General: Skin is warm and dry.  Neurological:     Mental Status: He is alert and oriented to person, place, and time.     Lab Results Lab Results  Component Value Date   WBC 5.1 03/13/2023   HGB 9.7 (L) 03/13/2023   HCT 29.9 (L) 03/13/2023   MCV 89.0 03/13/2023   PLT PLATELET CLUMPS NOTED ON SMEAR, UNABLE TO ESTIMATE 03/13/2023    Lab Results  Component Value Date   CREATININE 4.64 (H) 03/13/2023   BUN 42 (H) 03/13/2023   NA 133 (L) 03/13/2023   K 4.6 03/13/2023   CL 101 03/13/2023   CO2 21 (L) 03/13/2023    Lab Results  Component Value Date   ALT 51 (H) 03/07/2023   AST 32 03/07/2023   ALKPHOS 98 03/07/2023   BILITOT 0.6 03/07/2023     Microbiology: Recent Results (from the past 240 hour(s))  Blood culture (routine x 2)     Status: None   Collection Time: 03/07/23 12:16 PM   Specimen: BLOOD  Result Value Ref Range Status   Specimen Description BLOOD RIGHT ANTECUBITAL  Final   Special Requests   Final    BOTTLES DRAWN AEROBIC AND ANAEROBIC Blood Culture results may not be optimal due to an inadequate volume of blood received in culture bottles   Culture   Final    NO GROWTH 5 DAYS Performed at [facility], [address], [place], [place] [address]    Report Status 03/12/2023 FINAL  Final  Blood culture (routine x 2)     Status: None   Collection Time: 03/07/23 12:21 PM   Specimen: BLOOD RIGHT ARM  Result Value Ref Range Status   Specimen Description BLOOD RIGHT ARM  Final   Special Requests   Final    BOTTLES DRAWN AEROBIC ONLY Blood Culture results may not be optimal due to an inadequate volume of blood received in culture bottles   Culture   Final    NO GROWTH 5 DAYS Performed at [facility], [address] [place], [place] [address]    Report Status 03/12/2023 FINAL   Final     Marcos Eke, NP [facility]  03/14/2023  12:37 PM

## 2023-03-15 DIAGNOSIS — I480 Paroxysmal atrial fibrillation: Secondary | ICD-10-CM | POA: Diagnosis not present

## 2023-03-15 DIAGNOSIS — G9341 Metabolic encephalopathy: Secondary | ICD-10-CM | POA: Diagnosis not present

## 2023-03-15 DIAGNOSIS — B20 Human immunodeficiency virus [HIV] disease: Secondary | ICD-10-CM | POA: Diagnosis not present

## 2023-03-15 DIAGNOSIS — F1910 Other psychoactive substance abuse, uncomplicated: Secondary | ICD-10-CM | POA: Diagnosis not present

## 2023-03-15 LAB — GLUCOSE, CAPILLARY
Glucose-Capillary: 117 mg/dL — ABNORMAL HIGH (ref 70–99)
Glucose-Capillary: 168 mg/dL — ABNORMAL HIGH (ref 70–99)
Glucose-Capillary: 210 mg/dL — ABNORMAL HIGH (ref 70–99)
Glucose-Capillary: 289 mg/dL — ABNORMAL HIGH (ref 70–99)

## 2023-03-15 MED ORDER — POLYETHYLENE GLYCOL 3350 17 G PO PACK
17.0000 g | PACK | Freq: Two times a day (BID) | ORAL | Status: DC
  Administered 2023-03-15 – 2023-03-21 (×11): 17 g via ORAL
  Filled 2023-03-15 (×13): qty 1

## 2023-03-15 MED ORDER — LINACLOTIDE 145 MCG PO CAPS
145.0000 ug | ORAL_CAPSULE | Freq: Every day | ORAL | Status: DC
  Administered 2023-03-15 – 2023-03-17 (×3): 145 ug via ORAL
  Filled 2023-03-15 (×3): qty 1

## 2023-03-15 NOTE — Progress Notes (Addendum)
PROGRESS NOTE        PATIENT DETAILS Name: Michael [name] Age: 72 y.o. Sex: male Date of Birth: 1951-02-05 Admit Date: 03/07/2023 Admitting Physician Jonah Blue, MD RUE:AVWUJ, Michael Genre., MD  Brief Summary: Patient is a 72 y.o.  male with history of HFpEF, CAD s/p CABG, HTN, DM-2, PAF, COPD, mood disorder, HIV, cocaine use, homelessness-presented with confusion-in the setting of crack cocaine use and progression of CKD.  Patient was subsequently admitted to the hospitalist service.  See below for further details.  Significant events: 5/31>> admit to [facility].  Significant studies: 5/31>> CT head: no acute abnormality. 5/31>>CT C-spine: No fracture 5/31>> CT chest: Mild bronchial thickening/tree-in-bud opacities in the right lower lobe concerning 5/31 >> CT abdomen/pelvis: No evidence for acute abdominal process.  For infectious/inflammatory process 6/5>> CT abdomen/pelvis: No acute abnormality-moderate stool burden  Significant microbiology data: 5/31>> blood culture: No growth  Procedures: None  Consults: Infectious disease.  Subjective: Claims he did not have a BM yesterday.  Continues to have some intermittent abdominal pain.  No other issues overnight.  Objective: Vitals: Blood pressure (!) 132/59, pulse 82, temperature 98.2 F (36.8 C), resp. rate 16, height 5\' 8"  (1.727 m), weight 66.2 kg, SpO2 98 %.   Exam: Gen Exam:Alert awake-not in any distress HEENT:atraumatic, normocephalic Chest: B/L clear to auscultation anteriorly CVS:S1S2 regular Abdomen:soft non tender, non distended Extremities:no edema Neurology: Non focal Skin: no rash  Pertinent Labs/Radiology:    Latest Ref Rng & Units 03/13/2023    6:03 AM 03/10/2023    6:22 AM 03/09/2023    3:26 AM  CBC  WBC 4.0 - 10.5 K/uL 5.1  4.2  4.3   Hemoglobin 13.0 - 17.0 g/dL 9.7  9.1  8.2   Hematocrit 39.0 - 52.0 % 29.9  27.1  25.4   Platelets 150 - 400 K/uL PLATELET CLUMPS NOTED ON  SMEAR, UNABLE TO ESTIMATE  118  121     Lab Results  Component Value Date   NA 133 (L) 03/13/2023   K 4.6 03/13/2023   CL 101 03/13/2023   CO2 21 (L) 03/13/2023     Assessment/Plan: Acute toxic encephalopathy Secondary to crack cocaine use Resolved-completely awake/alert.  CKD 4 with progression to CKD 5 Stable with no indications for acute HD.  Per nephrology-poor HD candidate due to noncompliance homelessness.  Normocytic anemia Secondary to underlying CKD Follow CBC periodically  Abdominal pain Likely slightly worse due to constipation-but appears to be a chronic issue for patient-ongoing for the past several years Continue to treat underlying constipation-continue serial abdominal exams. Trial of Linzess to start 6/8.  Constipation Likely causing worsening abdominal pain. Continue bowel regimen with MiraLAX/senna  Acute on chronic HFpEF Euvolemic  CAD s/p CABG No anginal symptoms Continue aspirin/Lipitor  PAF Maintaining sinus rhythm Diltiazem Not a candidate for anticoagulation due to homelessness/noncompliance  HTN BP stable with hydralazine/Cardizem  DM-2 (A1c 6.8 on 12/25) CBG stable with SSI  Recent Labs    03/14/23 1605 03/14/23 2120 03/15/23 0758  GLUCAP 155* 313* 117*      HIV Antiretrovirals per ID.  COPD Not in exacerbation Continue bronchodilators  Mood disorder Cymbalta   Nutrition Status: Nutrition Problem: Moderate Malnutrition Etiology: chronic illness Signs/Symptoms: moderate muscle depletion, mild fat depletion Interventions: Ensure Enlive (each supplement provides 350kcal and 20 grams of protein), MVI, Liberalize Diet  BMI: Estimated body mass index is 22.19 kg/m as calculated from the following:   Height as of this encounter: 5\' 8"  (1.727 m).   Weight as of this encounter: 66.2 kg.   Code status:   Code Status: Full Code   DVT Prophylaxis: heparin injection 5,000 Units Start: 03/07/23 1400   Family  Communication: None at bedside  Disposition Plan: Status is: Inpatient Remains inpatient appropriate because: Severity of illness   Planned Discharge Destination:Skilled nursing facility   Diet: Diet Order             Diet regular Room service appropriate? Yes with Assist; Fluid consistency: Thin  Diet effective now                     Antimicrobial agents: Anti-infectives (From admission, onward)    Start     Dose/Rate Route Frequency Ordered Stop   03/14/23 0000  bictegravir-emtricitabine-tenofovir AF (BIKTARVY) 50-200-25 MG TABS tablet        1 tablet Oral Daily 03/14/23 1145     03/12/23 1200  bictegravir-emtricitabine-tenofovir AF (BIKTARVY) 50-200-25 MG per tablet 1 tablet        1 tablet Oral Daily with lunch 03/12/23 1023     03/11/23 1230  dolutegravir-rilpivirine (JULUCA) 50-25 MG per tablet 1 tablet  Status:  Discontinued        1 tablet Oral Daily with lunch 03/11/23 1131 03/12/23 1023   03/10/23 1130  bictegravir-emtricitabine-tenofovir AF (BIKTARVY) 50-200-25 MG per tablet 1 tablet  Status:  Discontinued        1 tablet Oral Daily with breakfast 03/10/23 1030 03/11/23 1131   03/08/23 0800  dolutegravir (TIVICAY) tablet 50 mg  Status:  Discontinued        50 mg Oral Daily before lunch 03/07/23 1521 03/10/23 1030   03/07/23 1700  rilpivirine (EDURANT) tablet 25 mg  Status:  Discontinued        25 mg Oral Daily with breakfast 03/07/23 1521 03/10/23 1030   03/07/23 1245  vancomycin (VANCOREADY) IVPB 1500 mg/300 mL        1,500 mg 150 mL/hr over 120 Minutes Intravenous  Once 03/07/23 1243 03/07/23 1639   03/07/23 1230  ceFEPIme (MAXIPIME) 2 g in sodium chloride 0.9 % 100 mL IVPB        2 g 200 mL/hr over 30 Minutes Intravenous  Once 03/07/23 1216 03/07/23 1435   03/07/23 1230  metroNIDAZOLE (FLAGYL) IVPB 500 mg        500 mg 100 mL/hr over 60 Minutes Intravenous  Once 03/07/23 1216 03/07/23 1439        MEDICATIONS: Scheduled Meds:  aspirin  81 mg Oral  Daily   atorvastatin  40 mg Oral Daily   bictegravir-emtricitabine-tenofovir AF  1 tablet Oral Q lunch   diltiazem  180 mg Oral Daily   DULoxetine  30 mg Oral Daily   feeding supplement (NEPRO CARB STEADY)  237 mL Oral TID BM   fluticasone furoate-vilanterol  1 puff Inhalation Daily   gabapentin  100 mg Oral BID   heparin  5,000 Units Subcutaneous Q8H   hydrALAZINE  75 mg Oral Q8H   insulin aspart  0-6 Units Subcutaneous TID WC   multivitamin  1 tablet Oral QHS   pantoprazole  40 mg Oral Q1200   sodium bicarbonate  1,300 mg Oral BID   sodium chloride flush  3 mL Intravenous Q12H   Continuous Infusions: PRN Meds:.acetaminophen **OR** acetaminophen, albuterol, calcium carbonate (dosed in mg  elemental calcium), camphor-menthol **AND** hydrOXYzine, docusate sodium, hydrALAZINE, ondansetron **OR** ondansetron (ZOFRAN) IV, sorbitol, zolpidem   I have personally reviewed following labs and imaging studies  LABORATORY DATA: CBC: Recent Labs  Lab 03/09/23 0326 03/10/23 0622 03/13/23 0603  WBC 4.3 4.2 5.1  HGB 8.2* 9.1* 9.7*  HCT 25.4* 27.1* 29.9*  MCV 86.1 86.3 89.0  PLT 121* 118* PLATELET CLUMPS NOTED ON SMEAR, UNABLE TO ESTIMATE     Basic Metabolic Panel: Recent Labs  Lab 03/09/23 0326 03/10/23 0622 03/11/23 0417 03/12/23 0555 03/13/23 0603  NA 138 139 138 135 133*  K 4.1 4.2 4.3 4.5 4.6  CL 107 108 104 101 101  CO2 22 23 24 23  21*  GLUCOSE 131* 104* 130* 147* 133*  BUN 66* 56* 47* 43* 42*  CREATININE 4.81* 4.77* 4.57* 4.64* 4.64*  CALCIUM 7.6* 7.7* 8.0* 8.5* 8.1*  PHOS 3.9 3.9 4.2 4.7* 3.8     GFR: Estimated Creatinine Clearance: 13.5 mL/min (A) (by C-G formula based on SCr of 4.64 mg/dL (H)).  Liver Function Tests: Recent Labs  Lab 03/09/23 0326 03/10/23 0622 03/11/23 0417 03/12/23 0555 03/13/23 0603  ALBUMIN 2.6* 2.5* 2.6* 2.8* 2.6*    Recent Labs  Lab 03/12/23 1542  LIPASE 46    No results for input(s): "AMMONIA" in the last 168  hours.  Coagulation Profile: No results for input(s): "INR", "PROTIME" in the last 168 hours.  Cardiac Enzymes: No results for input(s): "CKTOTAL", "CKMB", "CKMBINDEX", "TROPONINI" in the last 168 hours.   BNP (last 3 results) No results for input(s): "PROBNP" in the last 8760 hours.  Lipid Profile: No results for input(s): "CHOL", "HDL", "LDLCALC", "TRIG", "CHOLHDL", "LDLDIRECT" in the last 72 hours.  Thyroid Function Tests: No results for input(s): "TSH", "T4TOTAL", "FREET4", "T3FREE", "THYROIDAB" in the last 72 hours.  Anemia Panel: No results for input(s): "VITAMINB12", "FOLATE", "FERRITIN", "TIBC", "IRON", "RETICCTPCT" in the last 72 hours.  Urine analysis:    Component Value Date/Time   COLORURINE YELLOW 03/07/2023 1250   APPEARANCEUR CLEAR 03/07/2023 1250   LABSPEC 1.013 03/07/2023 1250   PHURINE 5.0 03/07/2023 1250   GLUCOSEU 50 (A) 03/07/2023 1250   HGBUR MODERATE (A) 03/07/2023 1250   BILIRUBINUR NEGATIVE 03/07/2023 1250   KETONESUR 5 (A) 03/07/2023 1250   PROTEINUR >=300 (A) 03/07/2023 1250   UROBILINOGEN 0.2 08/13/2015 1030   NITRITE NEGATIVE 03/07/2023 1250   LEUKOCYTESUR NEGATIVE 03/07/2023 1250    Sepsis Labs: Lactic Acid, Venous    Component Value Date/Time   LATICACIDVEN 1.2 03/07/2023 1344    MICROBIOLOGY: Recent Results (from the past 240 hour(s))  Blood culture (routine x 2)     Status: None   Collection Time: 03/07/23 12:16 PM   Specimen: BLOOD  Result Value Ref Range Status   Specimen Description BLOOD RIGHT ANTECUBITAL  Final   Special Requests   Final    BOTTLES DRAWN AEROBIC AND ANAEROBIC Blood Culture results may not be optimal due to an inadequate volume of blood received in culture bottles   Culture   Final    NO GROWTH 5 DAYS Performed at [facility], [address], [place], [place] [address]    Report Status 03/12/2023 FINAL  Final  Blood culture (routine x 2)     Status: None   Collection Time: 03/07/23 12:21 PM    Specimen: BLOOD RIGHT ARM  Result Value Ref Range Status   Specimen Description BLOOD RIGHT ARM  Final   Special Requests   Final  BOTTLES DRAWN AEROBIC ONLY Blood Culture results may not be optimal due to an inadequate volume of blood received in culture bottles   Culture   Final    NO GROWTH 5 DAYS Performed at [facility], [address], [place], [place] [address]    Report Status 03/12/2023 FINAL  Final    RADIOLOGY STUDIES/RESULTS: No results found.   LOS: 7 days   Jeoffrey Massed, MD  [facility]    To contact the attending provider between 7A-7P or the covering provider during after hours 7P-7A, please log into the web site [email or web address] and access using universal [place] password for that web site. If you do not have the password, please call the hospital operator.  03/15/2023, 9:37 AM

## 2023-03-15 NOTE — Progress Notes (Signed)
Mobility Specialist Progress Note:   03/15/23 1345  Mobility  Activity Ambulated with assistance in hallway  Level of Assistance Contact guard assist, steadying assist  Assistive Device Front wheel walker  Distance Ambulated (ft) 100 ft  Activity Response Tolerated well  Mobility Referral Yes  $Mobility charge 1 Mobility  Mobility Specialist Start Time (ACUTE ONLY) 1345  Mobility Specialist Stop Time (ACUTE ONLY) 1412  Mobility Specialist Time Calculation (min) (ACUTE ONLY) 27 min   Pt eager for mobility session. Required steadying assist throughout ambulation with RW. C/o generalized weakness, and abdominal soreness. Pt back in chair with all needs met, alarm on.  Addison Lank Mobility Specialist Please contact via SecureChat or  Rehab office at [phone]

## 2023-03-16 DIAGNOSIS — N184 Chronic kidney disease, stage 4 (severe): Secondary | ICD-10-CM | POA: Diagnosis not present

## 2023-03-16 DIAGNOSIS — B20 Human immunodeficiency virus [HIV] disease: Secondary | ICD-10-CM | POA: Diagnosis not present

## 2023-03-16 DIAGNOSIS — G9341 Metabolic encephalopathy: Secondary | ICD-10-CM | POA: Diagnosis not present

## 2023-03-16 DIAGNOSIS — I480 Paroxysmal atrial fibrillation: Secondary | ICD-10-CM | POA: Diagnosis not present

## 2023-03-16 DIAGNOSIS — R109 Unspecified abdominal pain: Secondary | ICD-10-CM | POA: Diagnosis not present

## 2023-03-16 DIAGNOSIS — F1910 Other psychoactive substance abuse, uncomplicated: Secondary | ICD-10-CM | POA: Diagnosis not present

## 2023-03-16 LAB — GLUCOSE, CAPILLARY
Glucose-Capillary: 164 mg/dL — ABNORMAL HIGH (ref 70–99)
Glucose-Capillary: 152 mg/dL — ABNORMAL HIGH (ref 70–99)
Glucose-Capillary: 200 mg/dL — ABNORMAL HIGH (ref 70–99)
Glucose-Capillary: 223 mg/dL — ABNORMAL HIGH (ref 70–99)

## 2023-03-16 NOTE — Progress Notes (Signed)
    [facility]    Date of Admission:  03/07/2023      ID: Michael [name] is a 72 y.o. male with HIV disease Principal Problem:   Acute metabolic encephalopathy Active Problems:   HIV (human immunodeficiency virus infection) ([name])   Human immunodeficiency virus (HIV) disease ([name])   Hypertension goal BP (blood pressure) < 140/80   Substance induced mood disorder ([name])   Type II diabetes mellitus with renal manifestations ([name])   S/P CABG (coronary artery bypass graft)   Paroxysmal A-fib ([name])   Malnutrition of moderate degree   COPD (chronic obstructive pulmonary disease) ([name])   Polysubstance abuse ([name])   CKD (chronic kidney disease), stage IV ([name])   Pure hypercholesterolemia    Subjective: Less abdominal pain but didn't have bowel movement yesterday, having gas. Eating without difficulty  Medications:   aspirin  81 mg Oral Daily   atorvastatin  40 mg Oral Daily   bictegravir-emtricitabine-tenofovir AF  1 tablet Oral Q lunch   diltiazem  180 mg Oral Daily   DULoxetine  30 mg Oral Daily   feeding supplement (NEPRO CARB STEADY)  237 mL Oral TID BM   fluticasone furoate-vilanterol  1 puff Inhalation Daily   gabapentin  100 mg Oral BID   heparin  5,000 Units Subcutaneous Q8H   hydrALAZINE  75 mg Oral Q8H   insulin aspart  0-6 Units Subcutaneous TID WC   linaclotide  145 mcg Oral QAC breakfast   multivitamin  1 tablet Oral QHS   pantoprazole  40 mg Oral Q1200   polyethylene glycol  17 g Oral BID   sodium bicarbonate  1,300 mg Oral BID   sodium chloride flush  3 mL Intravenous Q12H    Objective: Vital signs in last 24 hours: Temp:  [97.6 F (36.4 C)-98.7 F (37.1 C)] 98.7 F (37.1 C) (06/09 1148) Pulse Rate:  [78-87] 85 (06/09 1148) Resp:  [16-19] 19 (06/09 1148) BP: (132-171)/(64-84) 171/83 (06/09 1148) SpO2:  [96 %-98 %] 98 % (06/09 1148) Weight:  [66.2 kg] 66.2 kg (06/09 0342)  Physical Exam  Constitutional: He is oriented to  person, place, and time. He appears well-developed and well-nourished. No distress.  HENT:  Mouth/Throat: Oropharynx is clear and moist. No oropharyngeal exudate.  Cardiovascular: Normal rate, regular rhythm and normal heart sounds. Exam reveals no gallop and no friction rub.  No murmur heard.  Pulmonary/Chest: Effort normal and breath sounds normal. No respiratory distress. He has no wheezes.  Abdominal: Soft. Bowel sounds are normal. He exhibits no distension. There is no tenderness.  Lymphadenopathy:  He has no cervical adenopathy.  Neurological: He is alert and oriented to person, place, and time.  Skin: Skin is warm and dry. No rash noted. No erythema.  Psychiatric: He has a normal mood and affect. His behavior is normal.    Lab Results reviewed Studies/Results: No results found.   Assessment/Plan: HIV disease = plan to continue with daily biktarvy daily, check cd 4 count and VL in 1 month as this is a new regimen for him  CKD 4/ 5 = continues to not have needs for HD presently  Abdominal pain = unclear if related to excess miralax but now has constipation. Agree with trial of linzess and continuation of miralax.   Will sign off.      [facility] for Infectious Diseases Pager: [phone]  03/16/2023, 2:10 PM

## 2023-03-16 NOTE — Progress Notes (Signed)
Mobility Specialist Progress Note:   03/16/23 1300  Mobility  Activity Ambulated with assistance in hallway  Level of Assistance Contact guard assist, steadying assist  Assistive Device Front wheel walker  Distance Ambulated (ft) 200 ft  Activity Response Tolerated well  Mobility Referral Yes  $Mobility charge 1 Mobility  Mobility Specialist Start Time (ACUTE ONLY) 1300  Mobility Specialist Stop Time (ACUTE ONLY) 1332  Mobility Specialist Time Calculation (min) (ACUTE ONLY) 32 min   Pt eager for mobility session. Required minG assist to ambulate. C/o minor dizziness during session. Back in chair with all needs met, alarm set.   Addison Lank Mobility Specialist Please contact via SecureChat or  Rehab office at [phone]

## 2023-03-16 NOTE — Progress Notes (Signed)
PROGRESS NOTE        PATIENT DETAILS Name: Michael [name] Age: 72 y.o. Sex: male Date of Birth: 02-13-1951 Admit Date: 03/07/2023 Admitting Physician Jonah Blue, MD NWG:NFAOZ, [name], MD  Brief Summary: Patient is a 72 y.o.  male with history of HFpEF, CAD s/p CABG, HTN, DM-2, PAF, COPD, mood disorder, HIV, cocaine use, homelessness-presented with confusion-in the setting of crack cocaine use and progression of CKD.  Patient was subsequently admitted to the hospitalist service.  See below for further details.  Significant events: 5/31>> admit to [facility].  Significant studies: 5/31>> CT head: no acute abnormality. 5/31>>CT C-spine: No fracture 5/31>> CT chest: Mild bronchial thickening/tree-in-bud opacities in the right lower lobe concerning 5/31 >> CT abdomen/pelvis: No evidence for acute abdominal process.  For infectious/inflammatory process 6/5>> CT abdomen/pelvis: No acute abnormality-moderate stool burden  Significant microbiology data: 5/31>> blood culture: No growth  Procedures: None  Consults: Infectious disease Nephrology.  Subjective: No major issues overnight-has not had a BM in 2 days.  Abdominal pain is stable-and mild this morning.  No vomiting.  Passing flatus.  Objective: Vitals: Blood pressure 132/68, pulse 78, temperature 97.8 F (36.6 C), temperature source Oral, resp. rate 16, height 5\' 8"  (1.727 m), weight 66.2 kg, SpO2 98 %.   Exam: Gen Exam:Alert awake-not in any distress HEENT:atraumatic, normocephalic Chest: B/L clear to auscultation anteriorly CVS:S1S2 regular Abdomen:soft non tender, non distended Extremities:no edema Neurology: Non focal Skin: no rash  Pertinent Labs/Radiology:    Latest Ref Rng & Units 03/13/2023    6:03 AM 03/10/2023    6:22 AM 03/09/2023    3:26 AM  CBC  WBC 4.0 - 10.5 K/uL 5.1  4.2  4.3   Hemoglobin 13.0 - 17.0 g/dL 9.7  9.1  8.2   Hematocrit 39.0 - 52.0 % 29.9  27.1  25.4    Platelets 150 - 400 K/uL PLATELET CLUMPS NOTED ON SMEAR, UNABLE TO ESTIMATE  118  121     Lab Results  Component Value Date   NA 133 (L) 03/13/2023   K 4.6 03/13/2023   CL 101 03/13/2023   CO2 21 (L) 03/13/2023     Assessment/Plan: Acute toxic encephalopathy Secondary to crack cocaine use Resolved-completely awake/alert.  CKD 4 with progression to CKD 5 Stable with no indications for acute HD.  Per nephrology-poor HD candidate due to noncompliance homelessness. Follow renal function periodically.  Normocytic anemia Secondary to underlying CKD Follow CBC periodically  Abdominal pain Likely slightly worse due to constipation-but appears to be a chronic issue for patient-ongoing for the past several years Continue to treat underlying constipation-continue serial abdominal exams. Trial of Linzess to start 6/8.  Constipation Likely causing worsening abdominal pain. No BM in 2 days Soapsuds enema today if no response of sorbitol Continue bowel regimen with MiraLAX/senna  Acute on chronic HFpEF Euvolemic  CAD s/p CABG No anginal symptoms Continue aspirin/Lipitor  PAF Maintaining sinus rhythm Diltiazem Not a candidate for anticoagulation due to homelessness/noncompliance  HTN BP stable with hydralazine/Cardizem  DM-2 (A1c 6.8 on 12/25) CBG stable with SSI  Recent Labs    03/15/23 1716 03/15/23 2112 03/16/23 0827  GLUCAP 210* 289* 164*      HIV Antiretrovirals per ID.  COPD Not in exacerbation Continue bronchodilators  Mood disorder Cymbalta  Debility/deconditioning PT/OT eval recommending SNF-Per social work-will be difficult to place  given history of drug use Plan is to continue PT/OT-once improves-he will be discharged back to his prior living situation.  Homelessness Lives in a [facility] shelter   Nutrition Status: Nutrition Problem: Moderate Malnutrition Etiology: chronic illness Signs/Symptoms: moderate muscle depletion, mild fat  depletion Interventions: Ensure Enlive (each supplement provides 350kcal and 20 grams of protein), MVI, Liberalize Diet    BMI: Estimated body mass index is 22.19 kg/m as calculated from the following:   Height as of this encounter: 5\' 8"  (1.727 m).   Weight as of this encounter: 66.2 kg.   Code status:   Code Status: Full Code   DVT Prophylaxis: heparin injection 5,000 Units Start: 03/07/23 1400   Family Communication: None at bedside  Disposition Plan: Status is: Inpatient Remains inpatient appropriate because: Severity of illness   Planned Discharge Destination:Skilled nursing facility   Diet: Diet Order             Diet regular Room service appropriate? Yes with Assist; Fluid consistency: Thin  Diet effective now                     Antimicrobial agents: Anti-infectives (From admission, onward)    Start     Dose/Rate Route Frequency Ordered Stop   03/14/23 0000  bictegravir-emtricitabine-tenofovir AF (BIKTARVY) 50-200-25 MG TABS tablet        1 tablet Oral Daily 03/14/23 1145     03/12/23 1200  bictegravir-emtricitabine-tenofovir AF (BIKTARVY) 50-200-25 MG per tablet 1 tablet        1 tablet Oral Daily with lunch 03/12/23 1023     03/11/23 1230  dolutegravir-rilpivirine (JULUCA) 50-25 MG per tablet 1 tablet  Status:  Discontinued        1 tablet Oral Daily with lunch 03/11/23 1131 03/12/23 1023   03/10/23 1130  bictegravir-emtricitabine-tenofovir AF (BIKTARVY) 50-200-25 MG per tablet 1 tablet  Status:  Discontinued        1 tablet Oral Daily with breakfast 03/10/23 1030 03/11/23 1131   03/08/23 0800  dolutegravir (TIVICAY) tablet 50 mg  Status:  Discontinued        50 mg Oral Daily before lunch 03/07/23 1521 03/10/23 1030   03/07/23 1700  rilpivirine (EDURANT) tablet 25 mg  Status:  Discontinued        25 mg Oral Daily with breakfast 03/07/23 1521 03/10/23 1030   03/07/23 1245  vancomycin (VANCOREADY) IVPB 1500 mg/300 mL        1,500 mg 150 mL/hr over 120  Minutes Intravenous  Once 03/07/23 1243 03/07/23 1639   03/07/23 1230  ceFEPIme (MAXIPIME) 2 g in sodium chloride 0.9 % 100 mL IVPB        2 g 200 mL/hr over 30 Minutes Intravenous  Once 03/07/23 1216 03/07/23 1435   03/07/23 1230  metroNIDAZOLE (FLAGYL) IVPB 500 mg        500 mg 100 mL/hr over 60 Minutes Intravenous  Once 03/07/23 1216 03/07/23 1439        MEDICATIONS: Scheduled Meds:  aspirin  81 mg Oral Daily   atorvastatin  40 mg Oral Daily   bictegravir-emtricitabine-tenofovir AF  1 tablet Oral Q lunch   diltiazem  180 mg Oral Daily   DULoxetine  30 mg Oral Daily   feeding supplement (NEPRO CARB STEADY)  237 mL Oral TID BM   fluticasone furoate-vilanterol  1 puff Inhalation Daily   gabapentin  100 mg Oral BID   heparin  5,000 Units Subcutaneous Q8H   hydrALAZINE  75 mg Oral Q8H   insulin aspart  0-6 Units Subcutaneous TID WC   linaclotide  145 mcg Oral QAC breakfast   multivitamin  1 tablet Oral QHS   pantoprazole  40 mg Oral Q1200   polyethylene glycol  17 g Oral BID   sodium bicarbonate  1,300 mg Oral BID   sodium chloride flush  3 mL Intravenous Q12H   Continuous Infusions: PRN Meds:.acetaminophen **OR** acetaminophen, albuterol, calcium carbonate (dosed in mg elemental calcium), camphor-menthol **AND** hydrOXYzine, docusate sodium, hydrALAZINE, ondansetron **OR** ondansetron (ZOFRAN) IV, sorbitol, zolpidem   I have personally reviewed following labs and imaging studies  LABORATORY DATA: CBC: Recent Labs  Lab 03/10/23 0622 03/13/23 0603  WBC 4.2 5.1  HGB 9.1* 9.7*  HCT 27.1* 29.9*  MCV 86.3 89.0  PLT 118* PLATELET CLUMPS NOTED ON SMEAR, UNABLE TO ESTIMATE     Basic Metabolic Panel: Recent Labs  Lab 03/10/23 0622 03/11/23 0417 03/12/23 0555 03/13/23 0603  NA 139 138 135 133*  K 4.2 4.3 4.5 4.6  CL 108 104 101 101  CO2 23 24 23  21*  GLUCOSE 104* 130* 147* 133*  BUN 56* 47* 43* 42*  CREATININE 4.77* 4.57* 4.64* 4.64*  CALCIUM 7.7* 8.0* 8.5* 8.1*   PHOS 3.9 4.2 4.7* 3.8     GFR: Estimated Creatinine Clearance: 13.5 mL/min (A) (by C-G formula based on SCr of 4.64 mg/dL (H)).  Liver Function Tests: Recent Labs  Lab 03/10/23 0622 03/11/23 0417 03/12/23 0555 03/13/23 0603  ALBUMIN 2.5* 2.6* 2.8* 2.6*    Recent Labs  Lab 03/12/23 1542  LIPASE 46    No results for input(s): "AMMONIA" in the last 168 hours.  Coagulation Profile: No results for input(s): "INR", "PROTIME" in the last 168 hours.  Cardiac Enzymes: No results for input(s): "CKTOTAL", "CKMB", "CKMBINDEX", "TROPONINI" in the last 168 hours.   BNP (last 3 results) No results for input(s): "PROBNP" in the last 8760 hours.  Lipid Profile: No results for input(s): "CHOL", "HDL", "LDLCALC", "TRIG", "CHOLHDL", "LDLDIRECT" in the last 72 hours.  Thyroid Function Tests: No results for input(s): "TSH", "T4TOTAL", "FREET4", "T3FREE", "THYROIDAB" in the last 72 hours.  Anemia Panel: No results for input(s): "VITAMINB12", "FOLATE", "FERRITIN", "TIBC", "IRON", "RETICCTPCT" in the last 72 hours.  Urine analysis:    Component Value Date/Time   COLORURINE YELLOW 03/07/2023 1250   APPEARANCEUR CLEAR 03/07/2023 1250   LABSPEC 1.013 03/07/2023 1250   PHURINE 5.0 03/07/2023 1250   GLUCOSEU 50 (A) 03/07/2023 1250   HGBUR MODERATE (A) 03/07/2023 1250   BILIRUBINUR NEGATIVE 03/07/2023 1250   KETONESUR 5 (A) 03/07/2023 1250   PROTEINUR >=300 (A) 03/07/2023 1250   UROBILINOGEN 0.2 08/13/2015 1030   NITRITE NEGATIVE 03/07/2023 1250   LEUKOCYTESUR NEGATIVE 03/07/2023 1250    Sepsis Labs: Lactic Acid, Venous    Component Value Date/Time   LATICACIDVEN 1.2 03/07/2023 1344    MICROBIOLOGY: Recent Results (from the past 240 hour(s))  Blood culture (routine x 2)     Status: None   Collection Time: 03/07/23 12:16 PM   Specimen: BLOOD  Result Value Ref Range Status   Specimen Description BLOOD RIGHT ANTECUBITAL  Final   Special Requests   Final    BOTTLES DRAWN  AEROBIC AND ANAEROBIC Blood Culture results may not be optimal due to an inadequate volume of blood received in culture bottles   Culture   Final    NO GROWTH 5 DAYS Performed at [facility], [address], [place],  [place] [address]    Report Status 03/12/2023 FINAL  Final  Blood culture (routine x 2)     Status: None   Collection Time: 03/07/23 12:21 PM   Specimen: BLOOD RIGHT ARM  Result Value Ref Range Status   Specimen Description BLOOD RIGHT ARM  Final   Special Requests   Final    BOTTLES DRAWN AEROBIC ONLY Blood Culture results may not be optimal due to an inadequate volume of blood received in culture bottles   Culture   Final    NO GROWTH 5 DAYS Performed at [facility], [address], [place], [place] [address]    Report Status 03/12/2023 FINAL  Final    RADIOLOGY STUDIES/RESULTS: No results found.   LOS: 8 days   Jeoffrey Massed, MD  [facility]    To contact the attending provider between 7A-7P or the covering provider during after hours 7P-7A, please log into the web site [email or web address] and access using universal Lindsay password for that web site. If you do not have the password, please call the hospital operator.  03/16/2023, 9:52 AM

## 2023-03-16 NOTE — Progress Notes (Signed)
Occupational Therapy Treatment Patient Details Name: Michael [name] MRN: [id number] DOB: [birth year] Today's Date: 03/16/2023   History of present illness 72 year old male with history of HIV, homelessness, cocaine abuse, PAF, CKD stage IV, CAD status post CABG, IDDM who returns to the hospital on 03/07/23 after a recent admission on 02/14/23  with community-acquired pneumonia as well as suicidal ideation.  This admission pt presents with AMS, chest pain and CKD. He is well-known to our system with frequent ED visits, this being his 12th visit in the past month.   OT comments  Patient received in supine and eager to get OOB. Patient was min assist to get to EOB and to power up to stand. Patient demonstrating gains with grooming with supervision from min guard. Patient with difficulty reaching feet to change socks and educated on doffing socks with reacher and donning with sock aide with mod assist. Patient will benefit from continued inpatient follow up therapy, <3 hours/day to address self care and functional transfers.    Recommendations for follow up therapy are one component of a multi-disciplinary discharge planning process, led by the attending physician.  Recommendations may be updated based on patient status, additional functional criteria and insurance authorization.    Assistance Recommended at Discharge Frequent or constant Supervision/Assistance  Patient can return home with the following  Assistance with cooking/housework;Help with stairs or ramp for entrance;Assist for transportation;Direct supervision/assist for financial management;Direct supervision/assist for medications management;A lot of help with bathing/dressing/bathroom;A little help with walking and/or transfers   Equipment Recommendations  Other (comment) (defer)    Recommendations for Other Services      Precautions / Restrictions Precautions Precautions: Fall Precaution Comments: 3 falls in past 6  months Restrictions Weight Bearing Restrictions: No       Mobility Bed Mobility Overal bed mobility: Needs Assistance Bed Mobility: Supine to Sit     Supine to sit: HOB elevated, Min assist     General bed mobility comments: min assist with trunk and scooting to EOB    Transfers Overall transfer level: Needs assistance Equipment used: Rolling walker (2 wheels) Transfers: Sit to/from Stand Sit to Stand: Min assist           General transfer comment: cues for hand placement and min assist to power up, min guard once up     Balance Overall balance assessment: History of Falls, Needs assistance Sitting-balance support: Feet supported, No upper extremity supported Sitting balance-Leahy Scale: Fair     Standing balance support: Single extremity supported, Bilateral upper extremity supported, During functional activity Standing balance-Leahy Scale: Poor Standing balance comment: reliant on external support for balance                           ADL either performed or assessed with clinical judgement   ADL Overall ADL's : Needs assistance/impaired     Grooming: Wash/dry hands;Wash/dry face;Oral care;Supervision/safety;Standing Grooming Details (indicate cue type and reason): at sink             Lower Body Dressing: Moderate assistance;Sitting/lateral leans;With adaptive equipment Lower Body Dressing Details (indicate cue type and reason): instructions on AE use for LB dressing with reacher and sock aide and mod assist to perform due to unable to reach feet Toilet Transfer: Ambulation;Rolling walker (2 wheels);Minimal assistance Toilet Transfer Details (indicate cue type and reason): simulated to chair                Extremity/Trunk Assessment  Vision       Perception     Praxis      Cognition Arousal/Alertness: Awake/alert Behavior During Therapy: WFL for tasks assessed/performed Overall Cognitive Status:  Impaired/Different from baseline Area of Impairment: Safety/judgement, Awareness, Problem solving, Memory                     Memory: Decreased short-term memory   Safety/Judgement: Decreased awareness of deficits Awareness: Emergent Problem Solving: Slow processing, Requires verbal cues, Decreased initiation General Comments: able to remember therapist from when he was on [facility] program        Exercises      Shoulder Instructions       General Comments Patient stating he hasn't had a BM in days    Pertinent Vitals/ Pain       Pain Assessment Pain Assessment: Faces Faces Pain Scale: Hurts little more Pain Location: abdomen and BLE Pain Descriptors / Indicators: Discomfort, Grimacing, Guarding Pain Intervention(s): Monitored during session, Repositioned  Home Living                                          Prior Functioning/Environment              Frequency  Min 1X/week        Progress Toward Goals  OT Goals(current goals can now be found in the care plan section)  Progress towards OT goals: Progressing toward goals  Acute Rehab OT Goals Patient Stated Goal: feel better OT Goal Formulation: With patient Time For Goal Achievement: 03/22/23 Potential to Achieve Goals: Fair ADL Goals Pt Will Perform Grooming: with supervision;standing Pt Will Perform Lower Body Bathing: with min guard assist;sit to/from stand;sitting/lateral leans Pt Will Perform Lower Body Dressing: with set-up;with supervision;with adaptive equipment;sitting/lateral leans;sit to/from stand Pt Will Transfer to Toilet: ambulating;with supervision;regular height toilet Pt Will Perform Toileting - Clothing Manipulation and hygiene: with supervision;sitting/lateral leans;sit to/from stand Pt/caregiver will Perform Home Exercise Program: Increased ROM;Increased strength;Both right and left upper extremity;With Supervision Additional ADL Goal #1: Pt will demonstrate  improved mentation by scoring <4/10 on short blessed test and answering 4/4 safety questions from the [facility] correctly:   1. What do you do for yourself if you are sick with a cold.    2. What do you do if you burn yourself and the wound becomes infected.    3. What do you do if you experience severe chest pain and shortness of breath?   4. What number do you call in an emergency?  Plan Discharge plan remains appropriate;Frequency remains appropriate    Co-evaluation                 AM-PAC OT "6 Clicks" Daily Activity     Outcome Measure   Help from another person eating meals?: A Little Help from another person taking care of personal grooming?: A Little Help from another person toileting, which includes using toliet, bedpan, or urinal?: A Lot Help from another person bathing (including washing, rinsing, drying)?: A Lot Help from another person to put on and taking off regular upper body clothing?: A Little Help from another person to put on and taking off regular lower body clothing?: A Lot 6 Click Score: 15    End of Session Equipment Utilized During Treatment: Gait belt;Rolling walker (2 wheels)  OT Visit Diagnosis: Unsteadiness on feet (R26.81);Muscle weakness (generalized) (M62.81);Pain Pain -  Right/Left: Left Pain - part of body: Leg (abdominal pain)   Activity Tolerance Patient tolerated treatment well   Patient Left in chair;with call bell/phone within reach;with chair alarm set   Nurse Communication Mobility status        Time: 4098-1191 OT Time Calculation (min): 28 min  Charges: OT General Charges $OT Visit: 1 Visit OT Treatments $Self Care/Home Management : 23-37 mins  Alfonse Flavors, OTA Acute Rehabilitation Services  Office [phone]   Dewain Penning 03/16/2023, 11:47 AM

## 2023-03-17 DIAGNOSIS — I480 Paroxysmal atrial fibrillation: Secondary | ICD-10-CM | POA: Diagnosis not present

## 2023-03-17 DIAGNOSIS — F1910 Other psychoactive substance abuse, uncomplicated: Secondary | ICD-10-CM | POA: Diagnosis not present

## 2023-03-17 DIAGNOSIS — G9341 Metabolic encephalopathy: Secondary | ICD-10-CM | POA: Diagnosis not present

## 2023-03-17 DIAGNOSIS — B20 Human immunodeficiency virus [HIV] disease: Secondary | ICD-10-CM | POA: Diagnosis not present

## 2023-03-17 LAB — GLUCOSE, CAPILLARY
Glucose-Capillary: 129 mg/dL — ABNORMAL HIGH (ref 70–99)
Glucose-Capillary: 152 mg/dL — ABNORMAL HIGH (ref 70–99)
Glucose-Capillary: 243 mg/dL — ABNORMAL HIGH (ref 70–99)
Glucose-Capillary: 252 mg/dL — ABNORMAL HIGH (ref 70–99)

## 2023-03-17 MED ORDER — SENNOSIDES-DOCUSATE SODIUM 8.6-50 MG PO TABS
2.0000 | ORAL_TABLET | Freq: Every day | ORAL | Status: DC
  Administered 2023-03-17 – 2023-03-19 (×3): 2 via ORAL
  Filled 2023-03-17 (×3): qty 2

## 2023-03-17 MED ORDER — LINACLOTIDE 145 MCG PO CAPS
290.0000 ug | ORAL_CAPSULE | Freq: Every day | ORAL | Status: DC
  Administered 2023-03-18 – 2023-03-24 (×5): 290 ug via ORAL
  Filled 2023-03-17 (×6): qty 2

## 2023-03-17 NOTE — Progress Notes (Signed)
[facility]  Date of Admission:  03/07/2023      Total days of antibiotics 0          ASSESSMENT: Michael [name] is a 72 y.o. male with HIV disease not currently in care.  Several barriers to keeping him on treatment and we have tried working with him since January of this year since he has been in the area.    HIV Disease -  -was previously on TAF sparing regimen given his CKD (eGFR < 20) -Will continue the [facility] for him at this time.   CKD 4/5 -  -not a candidate for HD per nephrology    Homelessness -  -would like to get him established with outpatient medical case management with [facility] -Plans at this time is SNF.    Cocaine Use -  -troublesome with getting him resources through SNF, which he really could use some help  -attempted to get into drug rehab in [place] earlier this year however did not stay.  -Remains pre-contemplative over cessation.   PLAN: Continue Biktarvy daily  Will arrange OP follow up for our clinic 04/22/2023  @ 3:15 pm     Principal Problem:   Acute metabolic encephalopathy Active Problems:   HIV (human immunodeficiency virus infection) ([name])   Human immunodeficiency virus (HIV) disease ([name])   Hypertension goal BP (blood pressure) < 140/80   Substance induced mood disorder ([name])   Type II diabetes mellitus with renal manifestations ([name])   S/P CABG (coronary artery bypass graft)   Paroxysmal A-fib ([name])   Malnutrition of moderate degree   COPD (chronic obstructive pulmonary disease) ([name])   Polysubstance abuse ([name])   CKD (chronic kidney disease), stage IV ([name])   Pure hypercholesterolemia    aspirin  81 mg Oral Daily   atorvastatin  40 mg Oral Daily   bictegravir-emtricitabine-tenofovir AF  1 tablet Oral Q lunch   diltiazem  180 mg Oral Daily   DULoxetine  30 mg Oral Daily   feeding supplement (NEPRO CARB STEADY)  237 mL Oral TID BM   fluticasone furoate-vilanterol  1 puff  Inhalation Daily   gabapentin  100 mg Oral BID   heparin  5,000 Units Subcutaneous Q8H   hydrALAZINE  75 mg Oral Q8H   insulin aspart  0-6 Units Subcutaneous TID WC   linaclotide  145 mcg Oral QAC breakfast   multivitamin  1 tablet Oral QHS   pantoprazole  40 mg Oral Q1200   polyethylene glycol  17 g Oral BID   sodium bicarbonate  1,300 mg Oral BID   sodium chloride flush  3 mL Intravenous Q12H     SUBJECTIVE: No changes.   Barriers per our bridge counselor from Jan/Feb 2024: -attempted to secure SNF but substance use prohibited amongst other things.  -was in substance abuse tx in [place] briefly this year but did not stay    Review of Systems: Review of Systems  Constitutional:  Negative for chills and fever.  Gastrointestinal:  Positive for constipation. Negative for abdominal pain, diarrhea, nausea and vomiting.  Neurological:  Negative for headaches.  All other systems reviewed and are negative.    No Known Allergies  OBJECTIVE: Vitals:   03/16/23 2001 03/17/23 0000 03/17/23 0525 03/17/23 0736  BP: (!) 154/80 (!) 140/75 (!) 144/83 135/73  Pulse: 88 75 83 83  Resp: 19 16 18 20   Temp: 98.2 F (36.8 C) 98.4 F (36.9 C) 98.7  F (37.1 C) 98.3 F (36.8 C)  TempSrc: Oral Oral Oral Oral  SpO2: 98% 96% 97% 97%  Weight:      Height:       Body mass index is 22.19 kg/m.  Physical Exam Vitals reviewed.  Eyes:     Pupils: Pupils are equal, round, and reactive to light.  Cardiovascular:     Rate and Rhythm: Normal rate.  Abdominal:     General: Bowel sounds are normal. There is no distension.     Palpations: Abdomen is soft.  Skin:    General: Skin is warm and dry.     Capillary Refill: Capillary refill takes less than 2 seconds.  Neurological:     Mental Status: He is alert and oriented to person, place, and time.     Lab Results Lab Results  Component Value Date   WBC 5.1 03/13/2023   HGB 9.7 (L) 03/13/2023   HCT 29.9 (L) 03/13/2023   MCV 89.0  03/13/2023   PLT PLATELET CLUMPS NOTED ON SMEAR, UNABLE TO ESTIMATE 03/13/2023    Lab Results  Component Value Date   CREATININE 4.64 (H) 03/13/2023   BUN 42 (H) 03/13/2023   NA 133 (L) 03/13/2023   K 4.6 03/13/2023   CL 101 03/13/2023   CO2 21 (L) 03/13/2023    Lab Results  Component Value Date   ALT 51 (H) 03/07/2023   AST 32 03/07/2023   ALKPHOS 98 03/07/2023   BILITOT 0.6 03/07/2023     Microbiology: Recent Results (from the past 240 hour(s))  Blood culture (routine x 2)     Status: None   Collection Time: 03/07/23 12:16 PM   Specimen: BLOOD  Result Value Ref Range Status   Specimen Description BLOOD RIGHT ANTECUBITAL  Final   Special Requests   Final    BOTTLES DRAWN AEROBIC AND ANAEROBIC Blood Culture results may not be optimal due to an inadequate volume of blood received in culture bottles   Culture   Final    NO GROWTH 5 DAYS Performed at [facility], [address], [place], [place] [address]    Report Status 03/12/2023 FINAL  Final  Blood culture (routine x 2)     Status: None   Collection Time: 03/07/23 12:21 PM   Specimen: BLOOD RIGHT ARM  Result Value Ref Range Status   Specimen Description BLOOD RIGHT ARM  Final   Special Requests   Final    BOTTLES DRAWN AEROBIC ONLY Blood Culture results may not be optimal due to an inadequate volume of blood received in culture bottles   Culture   Final    NO GROWTH 5 DAYS Performed at [facility], [address] [place], [place] [address]    Report Status 03/12/2023 FINAL  Final    Rexene Alberts, MSN, NP-C [facility]  [email or web address] Pager: [phone] Office: [phone] [facility] Main Line: [phone] *Secure Chat Communication Welcome

## 2023-03-17 NOTE — Progress Notes (Signed)
PROGRESS NOTE        PATIENT DETAILS Name: Michael [name] Age: 72 y.o. Sex: male Date of Birth: 1951-04-26 Admit Date: 03/07/2023 Admitting Physician Jonah Blue, MD ZOX:[id number], [name], MD  Brief Summary: Patient is a 72 y.o.  male with history of HFpEF, CAD s/p CABG, HTN, DM-2, PAF, COPD, mood disorder, HIV, cocaine use, homelessness-presented with confusion-in the setting of crack cocaine use and progression of CKD.  Patient was subsequently admitted to the hospitalist service.  See below for further details.  Significant events: 5/31>> admit to [facility].  Significant studies: 5/31>> CT head: no acute abnormality. 5/31>>CT C-spine: No fracture 5/31>> CT chest: Mild bronchial thickening/tree-in-bud opacities in the right lower lobe concerning 5/31 >> CT abdomen/pelvis: No evidence for acute abdominal process.  For infectious/inflammatory process 6/5>> CT abdomen/pelvis: No acute abnormality-moderate stool burden  Significant microbiology data: 5/31>> blood culture: No growth  Procedures: None  Consults: Infectious disease Nephrology.  Subjective: Some mild abdominal pain-had a small BM yesterday.  No other issues overnight.  Objective: Vitals: Blood pressure 135/73, pulse 83, temperature 98.3 F (36.8 C), temperature source Oral, resp. rate 20, height 5\' 8"  (1.727 m), weight 66.2 kg, SpO2 97 %.   Exam: Gen Exam:Alert awake-not in any distress HEENT:atraumatic, normocephalic Chest: B/L clear to auscultation anteriorly CVS:S1S2 regular Abdomen:soft-mildly tender but without any peritoneal signs. Extremities:no edema Neurology: Non focal Skin: no rash  Pertinent Labs/Radiology:    Latest Ref Rng & Units 03/13/2023    6:03 AM 03/10/2023    6:22 AM 03/09/2023    3:26 AM  CBC  WBC 4.0 - 10.5 K/uL 5.1  4.2  4.3   Hemoglobin 13.0 - 17.0 g/dL 9.7  9.1  8.2   Hematocrit 39.0 - 52.0 % 29.9  27.1  25.4   Platelets 150 - 400 K/uL PLATELET CLUMPS  NOTED ON SMEAR, UNABLE TO ESTIMATE  118  121     Lab Results  Component Value Date   NA 133 (L) 03/13/2023   K 4.6 03/13/2023   CL 101 03/13/2023   CO2 21 (L) 03/13/2023     Assessment/Plan: Acute toxic encephalopathy Secondary to crack cocaine use Resolved-completely awake/alert.  CKD 4 with progression to CKD 5 Stable with no indications for acute HD.  Per nephrology-poor HD candidate due to noncompliance homelessness. Follow renal function periodically.  Normocytic anemia Secondary to underlying CKD Follow CBC periodically  Abdominal pain Likely slightly worse due to constipation-but appears to be a chronic issue for patient-ongoing for the past several years Continue to treat underlying constipation-continue serial abdominal exams. Trial of Linzess to start 6/8.  Constipation Likely causing worsening abdominal pain. Had a small BM yesterday Linzess increased on 6/10 Add Senokot Continue MiraLAX Soapsuds enema today (per nursing staff-refused yesterday).  Acute on chronic HFpEF Euvolemic  CAD s/p CABG No anginal symptoms Continue aspirin/Lipitor  PAF Maintaining sinus rhythm Diltiazem Not a candidate for anticoagulation due to homelessness/noncompliance  HTN BP stable with hydralazine/Cardizem  DM-2 (A1c 6.8 on 12/25) CBG stable with SSI  Recent Labs    03/16/23 1738 03/16/23 2100 03/17/23 0735  GLUCAP 200* 223* 129*      HIV Antiretrovirals per ID.  COPD Not in exacerbation Continue bronchodilators  Mood disorder Cymbalta  Debility/deconditioning PT/OT eval recommending SNF-Per social work-will be difficult to place given history of drug use Plan is to  continue PT/OT-once improves-he will be discharged back to his prior living situation.  Homelessness Lives in a [facility]   Nutrition Status: Nutrition Problem: Moderate Malnutrition Etiology: chronic illness Signs/Symptoms: moderate muscle depletion, mild fat  depletion Interventions: Ensure Enlive (each supplement provides 350kcal and 20 grams of protein), MVI, Liberalize Diet    BMI: Estimated body mass index is 22.19 kg/m as calculated from the following:   Height as of this encounter: 5\' 8"  (1.727 m).   Weight as of this encounter: 66.2 kg.   Code status:   Code Status: Full Code   DVT Prophylaxis: heparin injection 5,000 Units Start: 03/07/23 1400   Family Communication: None at bedside  Disposition Plan: Status is: Inpatient Remains inpatient appropriate because: Severity of illness   Planned Discharge Destination:Skilled nursing facility   Diet: Diet Order             Diet regular Room service appropriate? Yes with Assist; Fluid consistency: Thin  Diet effective now                     Antimicrobial agents: Anti-infectives (From admission, onward)    Start     Dose/Rate Route Frequency Ordered Stop   03/14/23 0000  bictegravir-emtricitabine-tenofovir AF (BIKTARVY) 50-200-25 MG TABS tablet        1 tablet Oral Daily 03/14/23 1145     03/12/23 1200  bictegravir-emtricitabine-tenofovir AF (BIKTARVY) 50-200-25 MG per tablet 1 tablet        1 tablet Oral Daily with lunch 03/12/23 1023     03/11/23 1230  dolutegravir-rilpivirine (JULUCA) 50-25 MG per tablet 1 tablet  Status:  Discontinued        1 tablet Oral Daily with lunch 03/11/23 1131 03/12/23 1023   03/10/23 1130  bictegravir-emtricitabine-tenofovir AF (BIKTARVY) 50-200-25 MG per tablet 1 tablet  Status:  Discontinued        1 tablet Oral Daily with breakfast 03/10/23 1030 03/11/23 1131   03/08/23 0800  dolutegravir (TIVICAY) tablet 50 mg  Status:  Discontinued        50 mg Oral Daily before lunch 03/07/23 1521 03/10/23 1030   03/07/23 1700  rilpivirine (EDURANT) tablet 25 mg  Status:  Discontinued        25 mg Oral Daily with breakfast 03/07/23 1521 03/10/23 1030   03/07/23 1245  vancomycin (VANCOREADY) IVPB 1500 mg/300 mL        1,500 mg 150 mL/hr over 120  Minutes Intravenous  Once 03/07/23 1243 03/07/23 1639   03/07/23 1230  ceFEPIme (MAXIPIME) 2 g in sodium chloride 0.9 % 100 mL IVPB        2 g 200 mL/hr over 30 Minutes Intravenous  Once 03/07/23 1216 03/07/23 1435   03/07/23 1230  metroNIDAZOLE (FLAGYL) IVPB 500 mg        500 mg 100 mL/hr over 60 Minutes Intravenous  Once 03/07/23 1216 03/07/23 1439        MEDICATIONS: Scheduled Meds:  aspirin  81 mg Oral Daily   atorvastatin  40 mg Oral Daily   bictegravir-emtricitabine-tenofovir AF  1 tablet Oral Q lunch   diltiazem  180 mg Oral Daily   DULoxetine  30 mg Oral Daily   feeding supplement (NEPRO CARB STEADY)  237 mL Oral TID BM   fluticasone furoate-vilanterol  1 puff Inhalation Daily   gabapentin  100 mg Oral BID   heparin  5,000 Units Subcutaneous Q8H   hydrALAZINE  75 mg Oral Q8H   insulin aspart  0-6 Units Subcutaneous TID WC   [START ON 03/18/2023] linaclotide  290 mcg Oral QAC breakfast   multivitamin  1 tablet Oral QHS   pantoprazole  40 mg Oral Q1200   polyethylene glycol  17 g Oral BID   senna-docusate  2 tablet Oral QHS   sodium bicarbonate  1,300 mg Oral BID   sodium chloride flush  3 mL Intravenous Q12H   Continuous Infusions: PRN Meds:.acetaminophen **OR** acetaminophen, albuterol, calcium carbonate (dosed in mg elemental calcium), camphor-menthol **AND** hydrOXYzine, hydrALAZINE, ondansetron **OR** ondansetron (ZOFRAN) IV, sorbitol, zolpidem   I have personally reviewed following labs and imaging studies  LABORATORY DATA: CBC: Recent Labs  Lab 03/13/23 0603  WBC 5.1  HGB 9.7*  HCT 29.9*  MCV 89.0  PLT PLATELET CLUMPS NOTED ON SMEAR, UNABLE TO ESTIMATE     Basic Metabolic Panel: Recent Labs  Lab 03/11/23 0417 03/12/23 0555 03/13/23 0603  NA 138 135 133*  K 4.3 4.5 4.6  CL 104 101 101  CO2 24 23 21*  GLUCOSE 130* 147* 133*  BUN 47* 43* 42*  CREATININE 4.57* 4.64* 4.64*  CALCIUM 8.0* 8.5* 8.1*  PHOS 4.2 4.7* 3.8     GFR: Estimated  Creatinine Clearance: 13.5 mL/min (A) (by C-G formula based on SCr of 4.64 mg/dL (H)).  Liver Function Tests: Recent Labs  Lab 03/11/23 0417 03/12/23 0555 03/13/23 0603  ALBUMIN 2.6* 2.8* 2.6*    Recent Labs  Lab 03/12/23 1542  LIPASE 46    No results for input(s): "AMMONIA" in the last 168 hours.  Coagulation Profile: No results for input(s): "INR", "PROTIME" in the last 168 hours.  Cardiac Enzymes: No results for input(s): "CKTOTAL", "CKMB", "CKMBINDEX", "TROPONINI" in the last 168 hours.   BNP (last 3 results) No results for input(s): "PROBNP" in the last 8760 hours.  Lipid Profile: No results for input(s): "CHOL", "HDL", "LDLCALC", "TRIG", "CHOLHDL", "LDLDIRECT" in the last 72 hours.  Thyroid Function Tests: No results for input(s): "TSH", "T4TOTAL", "FREET4", "T3FREE", "THYROIDAB" in the last 72 hours.  Anemia Panel: No results for input(s): "VITAMINB12", "FOLATE", "FERRITIN", "TIBC", "IRON", "RETICCTPCT" in the last 72 hours.  Urine analysis:    Component Value Date/Time   COLORURINE YELLOW 03/07/2023 1250   APPEARANCEUR CLEAR 03/07/2023 1250   LABSPEC 1.013 03/07/2023 1250   PHURINE 5.0 03/07/2023 1250   GLUCOSEU 50 (A) 03/07/2023 1250   HGBUR MODERATE (A) 03/07/2023 1250   BILIRUBINUR NEGATIVE 03/07/2023 1250   KETONESUR 5 (A) 03/07/2023 1250   PROTEINUR >=300 (A) 03/07/2023 1250   UROBILINOGEN 0.2 08/13/2015 1030   NITRITE NEGATIVE 03/07/2023 1250   LEUKOCYTESUR NEGATIVE 03/07/2023 1250    Sepsis Labs: Lactic Acid, Venous    Component Value Date/Time   LATICACIDVEN 1.2 03/07/2023 1344    MICROBIOLOGY: Recent Results (from the past 240 hour(s))  Blood culture (routine x 2)     Status: None   Collection Time: 03/07/23 12:16 PM   Specimen: BLOOD  Result Value Ref Range Status   Specimen Description BLOOD RIGHT ANTECUBITAL  Final   Special Requests   Final    BOTTLES DRAWN AEROBIC AND ANAEROBIC Blood Culture results may not be optimal due to  an inadequate volume of blood received in culture bottles   Culture   Final    NO GROWTH 5 DAYS Performed at [facility], [address], [place], [place] [address]    Report Status 03/12/2023 FINAL  Final  Blood culture (routine x 2)  Status: None   Collection Time: 03/07/23 12:21 PM   Specimen: BLOOD RIGHT ARM  Result Value Ref Range Status   Specimen Description BLOOD RIGHT ARM  Final   Special Requests   Final    BOTTLES DRAWN AEROBIC ONLY Blood Culture results may not be optimal due to an inadequate volume of blood received in culture bottles   Culture   Final    NO GROWTH 5 DAYS Performed at [facility], [address] [place], [place] [address]    Report Status 03/12/2023 FINAL  Final    RADIOLOGY STUDIES/RESULTS: No results found.   LOS: 9 days   Jeoffrey Massed, MD  [facility]    To contact the attending provider between 7A-7P or the covering provider during after hours 7P-7A, please log into the web site [email or web address] and access using universal [place] password for that web site. If you do not have the password, please call the hospital operator.  03/17/2023, 10:59 AM

## 2023-03-17 NOTE — TOC Transition Note (Signed)
TOC discharge medications READY and LOCATED in TOC pharmacy on 2nd floor awaiting pt discharge.  

## 2023-03-17 NOTE — Progress Notes (Signed)
Physical Therapy Treatment Patient Details Name: Michael [name] MRN: [id number] DOB: Mar 14, 1951 Today's Date: 03/17/2023   History of Present Illness 72 year old male with history of HIV, homelessness, cocaine abuse, PAF, CKD stage IV, CAD status post CABG, IDDM who returns to the hospital on 03/07/23 after a recent admission on 02/14/23  with community-acquired pneumonia as well as suicidal ideation.  This admission pt presents with AMS, chest pain and CKD. He is well-known to our system with frequent ED visits, this being his 12th visit in the past month.    PT Comments    Pt greeted up in recliner chair on arrival and agreeable to session. Pt able to progress gait distance this session, however pt continues to be limited by fatigue and poor self monitoring of fatigue as pt needing cues and encouragement for seated rest break as pt bil knees beginning to buckle. Pt requiring grossly min guard for mobility with min A needed to steady with increased fatigue. Educated pt on energy conservation strategies and ways to implement with pt verbalizing understanding. Pt continues to benefit from skilled PT services to progress toward functional mobility goals and current recommendations remain appropriate.    Recommendations for follow up therapy are one component of a multi-disciplinary discharge planning process, led by the attending physician.  Recommendations may be updated based on patient status, additional functional criteria and insurance authorization.  Follow Up Recommendations  Can patient physically be transported by private vehicle: No    Assistance Recommended at Discharge Frequent or constant Supervision/Assistance  Patient can return home with the following A lot of help with walking and/or transfers;A little help with bathing/dressing/bathroom;Assistance with cooking/housework;Direct supervision/assist for medications management;Assist for transportation;Help with stairs or ramp for  entrance;Direct supervision/assist for financial management   Equipment Recommendations  Rollator (4 wheels)    Recommendations for Other Services       Precautions / Restrictions Precautions Precautions: Fall Precaution Comments: 3 falls in past 6 months Restrictions Weight Bearing Restrictions: No     Mobility  Bed Mobility Overal bed mobility: Needs Assistance             General bed mobility comments: pt up in chair on arrival    Transfers Overall transfer level: Needs assistance Equipment used: Rolling walker (2 wheels) Transfers: Sit to/from Stand Sit to Stand: Min assist           General transfer comment: cues for hand placement and min assist to power up, min guard once up    Ambulation/Gait Ambulation/Gait assistance: Min assist, Min guard Gait Distance (Feet): 150 Feet (+65) Assistive device: Rolling walker (2 wheels) Gait Pattern/deviations: Decreased stride length, Trunk flexed, Narrow base of support, Decreased dorsiflexion - right, Decreased dorsiflexion - left, Drifts right/left, Step-through pattern Gait velocity: decreased     General Gait Details: cueing for upright posture and forward gaze, mild cues for RW management No overt LOB but minG to minA provided due to imbalance. Pt reports increasing fatigue with ambulation and needing x1 seated rest break   Stairs             Wheelchair Mobility    Modified Rankin (Stroke Patients Only)       Balance Overall balance assessment: History of Falls, Needs assistance Sitting-balance support: Feet supported, No upper extremity supported Sitting balance-Leahy Scale: Fair     Standing balance support: Single extremity supported, Bilateral upper extremity supported, During functional activity Standing balance-Leahy Scale: Poor Standing balance comment: reliant on external support for balance  Cognition Arousal/Alertness: Awake/alert Behavior  During Therapy: WFL for tasks assessed/performed Overall Cognitive Status: Impaired/Different from baseline Area of Impairment: Safety/judgement, Awareness, Problem solving, Memory                     Memory: Decreased short-term memory   Safety/Judgement: Decreased awareness of deficits Awareness: Emergent Problem Solving: Slow processing, Requires verbal cues, Decreased initiation          Exercises      General Comments        Pertinent Vitals/Pain Pain Assessment Pain Assessment: Faces Faces Pain Scale: Hurts a little bit Pain Location: abdomen Pain Descriptors / Indicators: Discomfort, Grimacing, Guarding Pain Intervention(s): Monitored during session, Limited activity within patient's tolerance    Home Living                          Prior Function            PT Goals (current goals can now be found in the care plan section) Acute Rehab PT Goals Patient Stated Goal: get stronger PT Goal Formulation: With patient Time For Goal Achievement: 03/22/23 Progress towards PT goals: Progressing toward goals    Frequency    Min 4X/week      PT Plan Current plan remains appropriate;Frequency needs to be updated    Co-evaluation              AM-PAC PT "6 Clicks" Mobility   Outcome Measure  Help needed turning from your back to your side while in a flat bed without using bedrails?: A Little Help needed moving from lying on your back to sitting on the side of a flat bed without using bedrails?: A Little Help needed moving to and from a bed to a chair (including a wheelchair)?: A Little Help needed standing up from a chair using your arms (e.g., wheelchair or bedside chair)?: A Little Help needed to walk in hospital room?: A Little Help needed climbing 3-5 steps with a railing? : Total 6 Click Score: 16    End of Session Equipment Utilized During Treatment: Gait belt Activity Tolerance: Patient tolerated treatment well;Patient limited  by fatigue Patient left: with call bell/phone within reach;in chair;with chair alarm set Nurse Communication: Mobility status PT Visit Diagnosis: Muscle weakness (generalized) (M62.81);History of falling (Z91.81);Difficulty in walking, not elsewhere classified (R26.2)     Time: 1610-9604 PT Time Calculation (min) (ACUTE ONLY): 17 min  Charges:  $Gait Training: 8-22 mins                     Scheryl Sanborn R. PTA Acute Rehabilitation Services Office: [phone]   Catalina Antigua 03/17/2023, 12:48 PM

## 2023-03-18 DIAGNOSIS — B20 Human immunodeficiency virus [HIV] disease: Secondary | ICD-10-CM | POA: Diagnosis not present

## 2023-03-18 DIAGNOSIS — F1910 Other psychoactive substance abuse, uncomplicated: Secondary | ICD-10-CM | POA: Diagnosis not present

## 2023-03-18 DIAGNOSIS — R194 Change in bowel habit: Secondary | ICD-10-CM

## 2023-03-18 DIAGNOSIS — G9341 Metabolic encephalopathy: Secondary | ICD-10-CM | POA: Diagnosis not present

## 2023-03-18 DIAGNOSIS — R110 Nausea: Secondary | ICD-10-CM

## 2023-03-18 DIAGNOSIS — R1084 Generalized abdominal pain: Secondary | ICD-10-CM

## 2023-03-18 DIAGNOSIS — Z951 Presence of aortocoronary bypass graft: Secondary | ICD-10-CM

## 2023-03-18 DIAGNOSIS — K5909 Other constipation: Secondary | ICD-10-CM

## 2023-03-18 DIAGNOSIS — I480 Paroxysmal atrial fibrillation: Secondary | ICD-10-CM | POA: Diagnosis not present

## 2023-03-18 DIAGNOSIS — R12 Heartburn: Secondary | ICD-10-CM

## 2023-03-18 LAB — GLUCOSE, CAPILLARY
Glucose-Capillary: 131 mg/dL — ABNORMAL HIGH (ref 70–99)
Glucose-Capillary: 192 mg/dL — ABNORMAL HIGH (ref 70–99)
Glucose-Capillary: 198 mg/dL — ABNORMAL HIGH (ref 70–99)
Glucose-Capillary: 130 mg/dL — ABNORMAL HIGH (ref 70–99)

## 2023-03-18 MED ORDER — PEG-KCL-NACL-NASULF-NA ASC-C 100 G PO SOLR
0.5000 | Freq: Once | ORAL | Status: AC
  Administered 2023-03-18: 100 g via ORAL
  Filled 2023-03-18: qty 1

## 2023-03-18 MED ORDER — PEG-KCL-NACL-NASULF-NA ASC-C 100 G PO SOLR: 1.0000 | Freq: Once | ORAL | Status: DC

## 2023-03-18 NOTE — Progress Notes (Addendum)
Nutrition Follow-up  DOCUMENTATION CODES:  Non-severe (moderate) malnutrition in context of chronic illness  INTERVENTION:  Continue Renal Multivitamin w/ minerals daily Discontinue Nepro Shake  Magic cup BID with meals, each supplement provides 290 kcal and 9 grams of protein Double protein portions with all meals Encourage good PO intake   NUTRITION DIAGNOSIS:  Moderate Malnutrition related to chronic illness as evidenced by moderate muscle depletion, mild fat depletion. - Ongoing  GOAL:  Patient will meet greater than or equal to 90% of their needs - Progressing  MONITOR:  PO intake, Supplement acceptance, Labs, Weight trends, I & O's  REASON FOR ASSESSMENT:  Malnutrition Screening Tool   ASSESSMENT:  72 y.o. male presented to the ED with AMS. Recently admitted 5/10-5/15 for CAP. PMH includes COPD, T2DM, HIV, polysubstance abuse, malnutrition, GERD, Hepatitis C, gout, CKD IV, and s/p CABG. Pt admitted with acute metabolic encephalopathy.   5/31 - Admitted 6/04 - diet liberalized to regular  Pt laying in bed, RN at bedside. Reports that he has not been eating as well. Denies any nausea or vomiting, but endorses abdominal pain. Pt with no BM in 6 days, likely contributing to his abdominal pain.  Pt reports that he is not drinking the nutritional supplements due to not liking them, states he has tried all of them. Agreeable to have Magic Cup sent up on trays.   RD observed multiple cola's on bedside table.   Meal Intake  6/1-6/3: 50-100% x 5 meals  6/7-6/9: 75-100% x 6 meals   Medications reviewed and include: NovoLog SSI, Rena-vit, Protonix, Miralax, Senokot-S, Sodium Bicarbonate Labs reviewed. CBG: 129-252 x 24 hrs  Diet Order:   Diet Order             Diet regular Room service appropriate? Yes with Assist; Fluid consistency: Thin  Diet effective now                   EDUCATION NEEDS: No education needs have been identified at this time  Skin:  Skin  Assessment: Reviewed RN Assessment  Last BM:  6/5  Height:  Ht Readings from Last 1 Encounters:  03/07/23 5\' 8"  (1.727 m)   Weight:  Wt Readings from Last 1 Encounters:  03/18/23 69.9 kg   Ideal Body Weight:  70 kg  BMI:  Body mass index is 23.43 kg/m.  Estimated Nutritional Needs:  Kcal:  1800-2000 Protein:  90-110 grams Fluid:  >/= 1.8 L   Kirby Crigler RD, LDN Clinical Dietitian See [facility] for contact information.

## 2023-03-18 NOTE — Progress Notes (Signed)
Physical Therapy Treatment Patient Details Name: Michael [name] MRN: [id number] DOB: 08-Dec-1950 Today's Date: 03/18/2023   History of Present Illness 72 year old male with history of HIV, homelessness, cocaine abuse, PAF, CKD stage IV, CAD status post CABG, IDDM who returns to the hospital on 03/07/23 after a recent admission on 02/14/23  with community-acquired pneumonia as well as suicidal ideation.  This admission pt presents with AMS, chest pain and CKD. He is well-known to our system with frequent ED visits, this being his 12th visit in the past month.    PT Comments    Pt greeted resting in bed and agreeable to session. Pt with increased fatigue this session having recently received enema and starting bowel prep. Pt continues to require grossly min guard for transfers and gait with py needing up to min A to rise from low commode. PT needing x1 seated rest during gait trial this session. Continued education on importance of frequent and aggressive mobility with pt verbalizing understanding. Pt continues to benefit from skilled PT services to progress toward functional mobility goals.    Recommendations for follow up therapy are one component of a multi-disciplinary discharge planning process, led by the attending physician.  Recommendations may be updated based on patient status, additional functional criteria and insurance authorization.  Follow Up Recommendations  Can patient physically be transported by private vehicle: No    Assistance Recommended at Discharge Frequent or constant Supervision/Assistance  Patient can return home with the following A lot of help with walking and/or transfers;A little help with bathing/dressing/bathroom;Assistance with cooking/housework;Direct supervision/assist for medications management;Assist for transportation;Help with stairs or ramp for entrance;Direct supervision/assist for financial management   Equipment Recommendations  Rollator (4 wheels)     Recommendations for Other Services       Precautions / Restrictions Precautions Precautions: Fall Precaution Comments: 3 falls in past 6 months Restrictions Weight Bearing Restrictions: No     Mobility  Bed Mobility Overal bed mobility: Needs Assistance Bed Mobility: Supine to Sit     Supine to sit: Min assist     General bed mobility comments: min A to elevate trunk    Transfers Overall transfer level: Needs assistance Equipment used: Rolling walker (2 wheels) Transfers: Sit to/from Stand, Bed to chair/wheelchair/BSC Sit to Stand: Min guard, Min assist           General transfer comment: min gaurd from EOB, min A from lwo commode    Ambulation/Gait Ambulation/Gait assistance: Min assist, Min guard Gait Distance (Feet): 65 Feet (x2) Assistive device: Rolling walker (2 wheels) Gait Pattern/deviations: Decreased stride length, Trunk flexed, Narrow base of support, Decreased dorsiflexion - right, Decreased dorsiflexion - left, Drifts right/left, Step-through pattern Gait velocity: decreased     General Gait Details: slower gait speed this session with mild cues for RW management, no overt LOB but minG provided due to imbalance. x1 seated rest   Stairs             Wheelchair Mobility    Modified Rankin (Stroke Patients Only)       Balance Overall balance assessment: History of Falls, Needs assistance Sitting-balance support: Feet supported, No upper extremity supported Sitting balance-Leahy Scale: Fair     Standing balance support: Single extremity supported, During functional activity Standing balance-Leahy Scale: Fair Standing balance comment: standing at sink to complete oral care                            Cognition  Arousal/Alertness: Awake/alert Behavior During Therapy: WFL for tasks assessed/performed Overall Cognitive Status: Impaired/Different from baseline Area of Impairment: Safety/judgement, Awareness, Problem solving,  Memory                     Memory: Decreased short-term memory   Safety/Judgement: Decreased awareness of deficits Awareness: Emergent Problem Solving: Slow processing, Requires verbal cues, Decreased initiation General Comments: able to remember therapist from when he was on [facility] program        Exercises      General Comments General comments (skin integrity, edema, etc.): VSS on RA      Pertinent Vitals/Pain Pain Assessment Pain Assessment: Faces Faces Pain Scale: Hurts a little bit Pain Location: generalized pain (back, chest, legs, etc) Pain Descriptors / Indicators: Discomfort, Grimacing, Guarding Pain Intervention(s): Monitored during session, Limited activity within patient's tolerance    Home Living                          Prior Function            PT Goals (current goals can now be found in the care plan section) Acute Rehab PT Goals Patient Stated Goal: get stronger PT Goal Formulation: With patient Time For Goal Achievement: 03/22/23 Progress towards PT goals: Progressing toward goals    Frequency    Min 4X/week      PT Plan Current plan remains appropriate;Frequency needs to be updated    Co-evaluation              AM-PAC PT "6 Clicks" Mobility   Outcome Measure  Help needed turning from your back to your side while in a flat bed without using bedrails?: A Little Help needed moving from lying on your back to sitting on the side of a flat bed without using bedrails?: A Little Help needed moving to and from a bed to a chair (including a wheelchair)?: A Little Help needed standing up from a chair using your arms (e.g., wheelchair or bedside chair)?: A Little Help needed to walk in hospital room?: A Little Help needed climbing 3-5 steps with a railing? : Total 6 Click Score: 16    End of Session Equipment Utilized During Treatment: Gait belt Activity Tolerance: Patient tolerated treatment well;Patient limited by  fatigue Patient left: with call bell/phone within reach;in chair;with chair alarm set Nurse Communication: Mobility status PT Visit Diagnosis: Muscle weakness (generalized) (M62.81);History of falling (Z91.81);Difficulty in walking, not elsewhere classified (R26.2)     Time: 1610-9604 PT Time Calculation (min) (ACUTE ONLY): 23 min  Charges:  $Gait Training: 23-37 mins                     Rajan Burgard R. PTA Acute Rehabilitation Services Office: [phone]   Catalina Antigua 03/18/2023, 4:35 PM

## 2023-03-18 NOTE — Consult Note (Addendum)
Attending physician's note   I have taken a history, reviewed the chart, and examined the patient. I performed a substantive portion of this encounter, including complete performance of at least one of the key components, in conjunction with the APP. I agree with the APP's note, impression, and recommendations with my edits.   72 year old male with medical history as outlined below, including history of atrial fibrillation, CAD s/p CABG, CHF pEF, CKD 4, COPD, diabetes, HTN, HLD, homelessness, polysubstance abuse, recent hospital admission 02/2023 with CAP, readmitted on 03/07/2023 with AMS.  GI service consulted for evaluation of abdominal pain and change in bowel habits.  He reports having generalized abdominal pain and constipation for at least the last year.  Constipation has been worsening over the last couple of months.  Does not take any medications for these issues as an outpatient.  Does have intermittent nausea and nonbloody emesis along with occasional heartburn.  Again does not taking medications for these UGI symptoms.  No dysphagia.  No previous EGD.  Last colonoscopy was 2014 and normal.  During this hospital admission, has been started on Linzess on 03/15/2023, and uptitrated on 03/17/2023, along with Senokot, MiraLAX, soapsuds enema, but still no BM in the last couple of days.  Labs notable for mild iron deficiency anemia with H/H 9.7/30, iron 42, TIBC 255, sat 17%.  CT A/P on 6/5 without acute intra-abdominal process, diverticulosis without diverticulitis, moderate colonic stool load.  1) Generalized abdominal pain 2) Change in bowel habits 3) Nausea 4) Heartburn - Given social situation, will plan on inpatient endoscopic evaluation for expedited workup of his chronic GI issues. - Continue aggressive bowel regimen - Will need extended 2-day bowel preparation given significant constipation and increased stool load on CT - Starting bowel prep today for tentative colonoscopy on  03/20/2023 - Plan for EGD at the same time for evaluation of mild UGI symptoms - Will reevaluate tomorrow for appropriateness of procedure on 6/13 vs continued prepping and procedures on 6/14 depending on response to aggressive bowel regimen and starting extended prep  5) Normocytic anemia - Mild iron deficiency on admission labs.  Suspect 2/2 anemia of chronic disease from underlying CKD, but will evaluate for e/o occult GIB with EGD/colonoscopy as above  6) CAD s/p CABG 7) CKD 4 8) CHFpEF 9) PAF 10) diabetes - Management per primary Hospital service - Not on anticoagulation due to homelessness, noncompliance, polysubstance abuse  The indications, risks, and benefits of EGD and colonoscopy were explained to the patient in detail. Risks include but are not limited to bleeding, perforation, adverse reaction to medications, and cardiopulmonary compromise. Sequelae include but are not limited to the possibility of surgery, hospitalization, and mortality. The patient verbalized understanding and wished to proceed. All questions answered    Michael [name] [phone] office          Consultation  Referring Provider:  Dr. Jerral [name] Primary Care Physician:  Michael Gaudy., MD Primary Gastroenterologist:    Dr. Arlyce [name]     Reason for Consultation:   Change in bowel habits and abdominal pain         HPI:   Michael [name] is a 72 y.o. male with a past medical history significant for A-fib, CAD, stage IV CKD, diabetes, homelessness, HIV, hypertension and hyperlipidemia, who presented to the hospital initially on 03/07/2023 for altered mental status.  We are consulted now for change in bowel habits and abdominal pain.    At time of admission  was noted that he was previously admitted 5/10 through 5/15 for CAP.  He returned on 5/29 with lower extremity edema, cough and diffuse pain and started on BP medications and given Lasix for 5 days.  Then seen in the ER on 5/30 for "unstable  living conditions" and cough with negative evaluation.  He returned 24 hours after discharge with altered mental status.  He was admitted with acute metabolic encephalopathy.    Hospital course: 5/31 CT of the head, C-spine with no abnormality.  CT of the chest with mild bronchial thickening/tree-in-bud opacities in the right lower lobe concerning, CT of the abdomen and pelvis with no acute intra-abdominal process.  6/5 CT of the abdomen pelvis with no acute abnormality-moderate stool burden.    Per hospitalist notes yesterday patient was still having some mild abdominal pain and had only had a small bowel movement the day before.  It was discussed that he had constipation which they thought may be causing his abdominal pain ongoing for the past several years, was given a trial of Linzess started on 03/15/2023.  This was increased on 03/17/2023.  Also on Senokot and MiraLAX as well as soapsuds enemas (patient has refused a few of these).    Today, patient explains that over the past couple of months he has had generalized abdominal pain.  He has noticed a change in bowel habits to from his normal of every other day to really hard to get out at all.  He tells me that he has not even had a good bowel movement here even after enemas and Senokot and others.  He would like to pursue a colonoscopy while here.  Denies any rectal bleeding.  Associated symptoms include occasional nausea.    Denies fever, chills or weight loss.  GI history: 05/20/2013 Dr. Arlyce [name] colonoscopy was normal.  Repeat recommended in 10 years.  Past Medical History:  Diagnosis Date   A-fib ([name])    Anemia    Asthma    No PFTs, history of childhood asthma   CAD (coronary artery disease)    Cellulitis 04/2014   left facial   Cellulitis and abscess of toe of right foot 12/08/2019   Chondromalacia of medial femoral condyle    Left knee MRI 04/28/12: Chondromalacia of the medial femoral condyle with slight peripheral degeneration of the  meniscocapsular junction of the medial meniscus; followed by sports medicine   Chronic kidney failure, stage 4 (severe) ([name])    Collagen vascular disease ([name])    Crack cocaine use    for 20+ years, has been enrolled in detox programs in the past   Depression    with history of hospitalization for suicidal ideation   Diabetes mellitus 2002   Diagnosed in 2002, started insulin in 2012   Gout    Headache(784.0)    CT head 08/2011: Periventricular and subcortical white matter hypodensities are most in keeping with chronic microangiopathic change   HIV infection ([name]) 08/2011   Followed by Dr. Ninetta Lights   Hyperlipidemia    Hypertension    Pulmonary embolism [facility])     Past Surgical History:  Procedure Laterality Date   AMPUTATION Right 07/21/2019   Procedure: RIGHT SECOND TOE AMPUTATION;  Surgeon: Nadara Mustard, MD;  Location: [facility] OR;  Service: Orthopedics;  Laterality: Right;   BACK SURGERY     1988   BOWEL RESECTION     CARDIAC SURGERY     CERVICAL SPINE SURGERY     " rods in my  neck "   CORONARY ARTERY BYPASS GRAFT     CORONARY STENT PLACEMENT     NM MYOCAR PERF WALL MOTION  12/27/2011   normal   SPINE SURGERY      Family History  Problem Relation Age of Onset   Diabetes Mother    Hypertension Mother    Hyperlipidemia Mother    Diabetes Father    Cancer Father    Hypertension Father    Diabetes Brother    Heart disease Brother    Diabetes Sister    Colon cancer Neg Hx     Social History   Tobacco Use   Smoking status: Never   Smokeless tobacco: Never  Vaping Use   Vaping Use: Never used  Substance Use Topics   Alcohol use: No    Alcohol/week: 4.0 standard drinks of alcohol    Types: 2 Cans of beer, 2 Shots of liquor per week   Drug use: Not Currently    Frequency: 4.0 times per week    Types: "Crack" cocaine, Cocaine    Comment: Recent use of crack    Prior to Admission medications   Medication Sig Start Date End Date Taking? Authorizing Provider   albuterol (VENTOLIN HFA) 108 (90 Base) MCG/ACT inhaler Inhale 1-2 puffs into the lungs every 6 (six) hours as needed for wheezing or shortness of breath. 02/19/23  Yes Azucena Fallen, MD  aspirin 81 MG chewable tablet Chew 1 tablet (81 mg total) by mouth daily. 02/19/23  Yes Azucena Fallen, MD  atorvastatin (LIPITOR) 40 MG tablet Take 1 tablet (40 mg total) by mouth daily. 02/19/23 06/19/23 Yes Azucena Fallen, MD  bictegravir-emtricitabine-tenofovir AF (BIKTARVY) 50-200-25 MG TABS tablet Take 1 tablet by mouth daily. 03/14/23  Yes Judyann Munson, MD  diltiazem (CARDIZEM CD) 180 MG 24 hr capsule Take 1 capsule (180 mg total) by mouth daily. 02/19/23 03/21/23 Yes Azucena Fallen, MD  dolutegravir (TIVICAY) 50 MG tablet Take 1 tablet (50 mg total) by mouth daily before lunch. 02/19/23  Yes Azucena Fallen, MD  DULoxetine (CYMBALTA) 30 MG capsule Take 1 capsule (30 mg total) by mouth daily. 02/19/23  Yes Azucena Fallen, MD  fluticasone furoate-vilanterol (BREO ELLIPTA) 200-25 MCG/ACT AEPB Inhale 1 puff into the lungs daily. 02/19/23  Yes Azucena Fallen, MD  rilpivirine (EDURANT) 25 MG TABS tablet Take 1 tablet (25 mg total) by mouth daily before lunch. 02/19/23  Yes Azucena Fallen, MD  sodium bicarbonate 650 MG tablet Take 1 tablet (650 mg total) by mouth 3 (three) times daily. 02/19/23 03/21/23 Yes Azucena Fallen, MD  nitroGLYCERIN (NITROSTAT) 0.4 MG SL tablet Place 1 tablet (0.4 mg total) under the tongue every 5 (five) minutes as needed for chest pain. 02/19/23   Azucena Fallen, MD  polyethylene glycol (MIRALAX / GLYCOLAX) 17 g packet Take 17 g by mouth 2 (two) times daily. 12/17/22   Regalado, Prentiss Bells, MD    Current Facility-Administered Medications  Medication Dose Route Frequency Provider Last Rate Last Admin   acetaminophen (TYLENOL) tablet 650 mg  650 mg Oral Q6H PRN Jonah Blue, MD   650 mg at 03/17/23 0825   Or   acetaminophen (TYLENOL)  suppository 650 mg  650 mg Rectal Q6H PRN Jonah Blue, MD       albuterol (PROVENTIL) (2.5 MG/3ML) 0.083% nebulizer solution 2.5 mg  2.5 mg Nebulization Q6H PRN Pham, Minh Q, RPH-CPP       aspirin chewable tablet 81 mg  81 mg Oral Daily Jonah Blue, MD   81 mg at 03/17/23 1914   atorvastatin (LIPITOR) tablet 40 mg  40 mg Oral Daily Jonah Blue, MD   40 mg at 03/17/23 0825   bictegravir-emtricitabine-tenofovir AF (BIKTARVY) 50-200-25 MG per tablet 1 tablet  1 tablet Oral Q lunch Pham, Minh Q, RPH-CPP   1 tablet at 03/17/23 1210   calcium carbonate (dosed in mg elemental calcium) suspension 500 mg of elemental calcium  500 mg of elemental calcium Oral Q6H PRN Jonah Blue, MD   500 mg of elemental calcium at 03/12/23 0203   camphor-menthol (SARNA) lotion 1 Application  1 Application Topical Q8H PRN Jonah Blue, MD       And   hydrOXYzine (ATARAX) tablet 25 mg  25 mg Oral Q8H PRN Jonah Blue, MD       diltiazem (CARDIZEM CD) 24 hr capsule 180 mg  180 mg Oral Daily Jonah Blue, MD   180 mg at 03/17/23 7829   DULoxetine (CYMBALTA) DR capsule 30 mg  30 mg Oral Daily Jonah Blue, MD   30 mg at 03/17/23 5621   feeding supplement (NEPRO CARB STEADY) liquid 237 mL  237 mL Oral TID BM Elgergawy, Leana Roe, MD   237 mL at 03/17/23 1442   fluticasone furoate-vilanterol (BREO ELLIPTA) 200-25 MCG/ACT 1 puff  1 puff Inhalation Daily Jonah Blue, MD   1 puff at 03/18/23 0732   gabapentin (NEURONTIN) capsule 100 mg  100 mg Oral BID Elgergawy, Leana Roe, MD   100 mg at 03/17/23 2127   heparin injection 5,000 Units  5,000 Units Subcutaneous Bobette Mo, MD   5,000 Units at 03/18/23 0557   hydrALAZINE (APRESOLINE) injection 5 mg  5 mg Intravenous Q4H PRN Jonah Blue, MD   5 mg at 03/07/23 1633   hydrALAZINE (APRESOLINE) tablet 75 mg  75 mg Oral Q8H Elgergawy, Leana Roe, MD   75 mg at 03/18/23 0556   insulin aspart (novoLOG) injection 0-6 Units  0-6 Units Subcutaneous TID WC  Jonah Blue, MD   1 Units at 03/17/23 1210   linaclotide (LINZESS) capsule 290 mcg  290 mcg Oral QAC breakfast Ghimire, Werner Lean, MD       multivitamin (RENA-VIT) tablet 1 tablet  1 tablet Oral QHS Elgergawy, Leana Roe, MD   1 tablet at 03/17/23 2125   ondansetron (ZOFRAN) tablet 4 mg  4 mg Oral Q6H PRN Jonah Blue, MD       Or   ondansetron [facility]) injection 4 mg  4 mg Intravenous Q6H PRN Jonah Blue, MD   4 mg at 03/11/23 0910   pantoprazole (PROTONIX) EC tablet 40 mg  40 mg Oral Q1200 Maretta Bees, MD   40 mg at 03/17/23 1210   polyethylene glycol (MIRALAX / GLYCOLAX) packet 17 g  17 g Oral BID Maretta Bees, MD   17 g at 03/17/23 2135   senna-docusate (Senokot-S) tablet 2 tablet  2 tablet Oral QHS Maretta Bees, MD   2 tablet at 03/17/23 2125   sodium bicarbonate tablet 1,300 mg  1,300 mg Oral BID Maxie Barb, MD   1,300 mg at 03/17/23 2125   sodium chloride flush (NS) 0.9 % injection 3 mL  3 mL Intravenous Steva Colder, MD   3 mL at 03/17/23 2128   sorbitol 70 % solution 30 mL  30 mL Oral PRN Jonah Blue, MD   30 mL at 03/16/23 2035   zolpidem (AMBIEN) tablet 5  mg  5 mg Oral QHS PRN Jonah Blue, MD   5 mg at 03/07/23 2214    Allergies as of 03/07/2023   (No Known Allergies)     Review of Systems:    Constitutional: No weight loss, fever or chills Skin: No rash Cardiovascular: No chest pain Respiratory: No SOB Gastrointestinal: See HPI and otherwise negative Genitourinary: No dysuria  Neurological: No headache, dizziness or syncope Musculoskeletal: No new muscle or joint pain Hematologic: No bleeding  Psychiatric: No history of depression or anxiety    Physical Exam:  Vital signs in last 24 hours: Temp:  [98.1 F (36.7 C)-98.4 F (36.9 C)] 98.3 F (36.8 C) (06/11 0732) Pulse Rate:  [76-82] 78 (06/11 0745) Resp:  [16-18] 18 (06/11 0745) BP: (127-160)/(69-82) 148/81 (06/11 0732) SpO2:  [96 %-100 %] 100 % (06/11  0732) Weight:  [69.9 kg] 69.9 kg (06/11 0500) Last BM Date : 03/12/23 General:   Pleasant AA male appears to be in NAD, Well developed, Well nourished, alert and cooperative Head:  Normocephalic and atraumatic. Eyes:   PEERL, EOMI. No icterus. Conjunctiva pink. Ears:  Normal auditory acuity. Neck:  Supple Throat: Oral cavity and pharynx without inflammation, swelling or lesion.Poor dentition Lungs: Respirations even and unlabored. Lungs clear to auscultation bilaterally.   No wheezes, crackles, or rhonchi.  Heart: Normal S1, S2. No MRG. Regular rate and rhythm. No peripheral edema, cyanosis or pallor.  Abdomen: Tense, moderate distention, generalized TTP, some involuntary guarding,Normal bowel sounds. No appreciable masses or hepatomegaly. Rectal:  Not performed.  Msk:  Symmetrical without gross deformities. Peripheral pulses intact.  Extremities:  Without edema, no deformity or joint abnormality.  Neurologic:  Alert and  oriented x4;  grossly normal neurologically.  Skin:   Dry and intact without significant lesions or rashes. Psychiatric: Demonstrates good judgement and reason without abnormal affect or behaviors.  Most recent labs: 03/13/2023 sodium 133, creatinine 4.64, albumin 2.6, hemoglobin 9.7 03/08/2023 iron studies with an iron low at 42, percent saturation low at 17, ferritin 422   Impression / Plan:   Impression: 1.  Generalized abdominal pain: Over the past 2 to 3 months with a change in bowel habits towards constipation; likely related to constipation +/- use of cocaine 2.  Constipation 3.  Iron deficiency anemia: No signs of overt GI bleeding, does have known stage V CKD which is likely contributory 4.  CKD 4 with progression to CKD 5 5.  Acute on chronic heart failure preserved EF 6.  CAD status post CABG: On aspirin 7.  A-fib: Not on anticoagulation given homelessness/noncompliance  Plan: 1.  While patient is here will consider EGD and colonoscopy to evaluate iron  deficiency anemia, generalized abdominal pain and constipation.  Timing per Dr. Barron Alvine.  Went ahead and discussed risk benefits, notations and alternatives and patient agrees to proceed. 2.  Patient will need to clear liquid diet prior to bowel prep.  Will change when we have this procedure scheduled. 3.  Continue supportive measures 4.  Patient will need 2-day bowel prep given severe constipation history.  Will tentatively start today with plans for procedures on Thursday, 03/20/2023.  Thank you for your kind consultation, we will continue to follow.  Violet Baldy [facility]  03/18/2023, 9:06 AM

## 2023-03-18 NOTE — Progress Notes (Signed)
Mobility Specialist Progress Note:   03/18/23 1430  Mobility  Activity Ambulated with assistance in hallway  Level of Assistance Contact guard assist, steadying assist  Assistive Device Front wheel walker  Distance Ambulated (ft) 200 ft  Activity Response Tolerated fair  Mobility Referral Yes  $Mobility charge 1 Mobility  Mobility Specialist Start Time (ACUTE ONLY) 1430  Mobility Specialist Stop Time (ACUTE ONLY) 1448  Mobility Specialist Time Calculation (min) (ACUTE ONLY) 18 min   Pt eager for mobility session. Required minG assist throughout ambulation. C/o dizziness throughout session, required short seated rest break. Pt back in chair with all needs met, chair alarm on.   Addison Lank Mobility Specialist Please contact via SecureChat or  Rehab office at [phone]

## 2023-03-18 NOTE — Progress Notes (Addendum)
PROGRESS NOTE        PATIENT DETAILS Name: Michael [name] Age: 72 y.o. Sex: male Date of Birth: 1951-08-30 Admit Date: 03/07/2023 Admitting Physician Jonah Blue, MD ZOX:[id number], [name], MD  Brief Summary: Patient is a 72 y.o.  male with history of HFpEF, CAD s/p CABG, HTN, DM-2, PAF, COPD, mood disorder, HIV, cocaine use, homelessness-presented with confusion-in the setting of crack cocaine use and progression of CKD.  Patient was subsequently admitted to the hospitalist service.  See below for further details.  Significant events: 5/31>> admit to [facility].  Significant studies: 5/31>> CT head: no acute abnormality. 5/31>>CT C-spine: No fracture 5/31>> CT chest: Mild bronchial thickening/tree-in-bud opacities in the right lower lobe concerning 5/31 >> CT abdomen/pelvis: No evidence for acute abdominal process.  For infectious/inflammatory process 6/5>> CT abdomen/pelvis: No acute abnormality-moderate stool burden  Significant microbiology data: 5/31>> blood culture: No growth  Procedures: None  Consults: Infectious disease Nephrology GI  Subjective: No BM for almost 2 days.  Continues to have intermittent abdominal pain.  No other new issues.  Objective: Vitals: Blood pressure (!) 148/81, pulse 78, temperature 98.3 F (36.8 C), temperature source Oral, resp. rate 18, height 5\' 8"  (1.727 m), weight 69.9 kg, SpO2 100 %.   Exam: Gen Exam:Alert awake-not in any distress HEENT:atraumatic, normocephalic Chest: B/L clear to auscultation anteriorly CVS:S1S2 regular Abdomen:soft-diffusely tender-no peritoneal signs. Extremities:no edema Neurology: Non focal Skin: no rash  Pertinent Labs/Radiology:    Latest Ref Rng & Units 03/13/2023    6:03 AM 03/10/2023    6:22 AM 03/09/2023    3:26 AM  CBC  WBC 4.0 - 10.5 K/uL 5.1  4.2  4.3   Hemoglobin 13.0 - 17.0 g/dL 9.7  9.1  8.2   Hematocrit 39.0 - 52.0 % 29.9  27.1  25.4   Platelets 150 - 400 K/uL  PLATELET CLUMPS NOTED ON SMEAR, UNABLE TO ESTIMATE  118  121     Lab Results  Component Value Date   NA 133 (L) 03/13/2023   K 4.6 03/13/2023   CL 101 03/13/2023   CO2 21 (L) 03/13/2023     Assessment/Plan: Acute toxic encephalopathy Secondary to crack cocaine use Resolved-completely awake/alert.  CKD 4 with progression to CKD 5 Stable with no indications for acute HD.  Per nephrology-poor HD candidate due to noncompliance homelessness. Follow renal function periodically.  Normocytic anemia Secondary to underlying CKD Follow CBC periodically  Abdominal pain Likely slightly worse due to constipation-but appears to be a chronic issue for patient-ongoing for the past several years Trial of Linzess-started on 6/8-dosage increased on 6/10-continues to have intermittent abdominal pain GI consulted 6/11.   Constipation Likely causing worsening abdominal pain. Has had numerous suppositories/enema without much significant response Given significant change in bowel habit-ongoing abdominal pain-GI consulted for colonoscopy. Continue MiraLAX/senna Linzess dosage increased 6/10 Soapsuds enema today-did not get yesterday  Acute on chronic HFpEF Euvolemic  CAD s/p CABG No anginal symptoms Continue aspirin/Lipitor  PAF Maintaining sinus rhythm Diltiazem Not a candidate for anticoagulation due to homelessness/noncompliance  HTN BP stable with hydralazine/Cardizem  DM-2 (A1c 6.8 on 12/25) CBG stable with SSI  Recent Labs    03/17/23 1528 03/17/23 2112 03/18/23 0734  GLUCAP 243* 252* 131*      HIV Antiretrovirals per ID.  COPD Not in exacerbation Continue bronchodilators  Mood disorder Cymbalta  Debility/deconditioning  PT/OT eval recommending SNF-Per social work-will be difficult to place given history of drug use Plan is to continue PT/OT-once improves-he will be discharged back to his prior living situation.  Homelessness Lives in a [facility]  shelter   Nutrition Status: Nutrition Problem: Moderate Malnutrition Etiology: chronic illness Signs/Symptoms: moderate muscle depletion, mild fat depletion Interventions: Magic cup, MVI    BMI: Estimated body mass index is 23.43 kg/m as calculated from the following:   Height as of this encounter: 5\' 8"  (1.727 m).   Weight as of this encounter: 69.9 kg.   Code status:   Code Status: Full Code   DVT Prophylaxis: heparin injection 5,000 Units Start: 03/07/23 1400   Family Communication: None at bedside  Disposition Plan: Status is: Inpatient Remains inpatient appropriate because: Severity of illness   Planned Discharge Destination:Skilled nursing facility   Diet: Diet Order             Diet regular Room service appropriate? Yes with Assist; Fluid consistency: Thin  Diet effective now                     Antimicrobial agents: Anti-infectives (From admission, onward)    Start     Dose/Rate Route Frequency Ordered Stop   03/14/23 0000  bictegravir-emtricitabine-tenofovir AF (BIKTARVY) 50-200-25 MG TABS tablet        1 tablet Oral Daily 03/14/23 1145     03/12/23 1200  bictegravir-emtricitabine-tenofovir AF (BIKTARVY) 50-200-25 MG per tablet 1 tablet        1 tablet Oral Daily with lunch 03/12/23 1023     03/11/23 1230  dolutegravir-rilpivirine (JULUCA) 50-25 MG per tablet 1 tablet  Status:  Discontinued        1 tablet Oral Daily with lunch 03/11/23 1131 03/12/23 1023   03/10/23 1130  bictegravir-emtricitabine-tenofovir AF (BIKTARVY) 50-200-25 MG per tablet 1 tablet  Status:  Discontinued        1 tablet Oral Daily with breakfast 03/10/23 1030 03/11/23 1131   03/08/23 0800  dolutegravir (TIVICAY) tablet 50 mg  Status:  Discontinued        50 mg Oral Daily before lunch 03/07/23 1521 03/10/23 1030   03/07/23 1700  rilpivirine (EDURANT) tablet 25 mg  Status:  Discontinued        25 mg Oral Daily with breakfast 03/07/23 1521 03/10/23 1030   03/07/23 1245   vancomycin (VANCOREADY) IVPB 1500 mg/300 mL        1,500 mg 150 mL/hr over 120 Minutes Intravenous  Once 03/07/23 1243 03/07/23 1639   03/07/23 1230  ceFEPIme (MAXIPIME) 2 g in sodium chloride 0.9 % 100 mL IVPB        2 g 200 mL/hr over 30 Minutes Intravenous  Once 03/07/23 1216 03/07/23 1435   03/07/23 1230  metroNIDAZOLE (FLAGYL) IVPB 500 mg        500 mg 100 mL/hr over 60 Minutes Intravenous  Once 03/07/23 1216 03/07/23 1439        MEDICATIONS: Scheduled Meds:  aspirin  81 mg Oral Daily   atorvastatin  40 mg Oral Daily   bictegravir-emtricitabine-tenofovir AF  1 tablet Oral Q lunch   diltiazem  180 mg Oral Daily   DULoxetine  30 mg Oral Daily   fluticasone furoate-vilanterol  1 puff Inhalation Daily   gabapentin  100 mg Oral BID   heparin  5,000 Units Subcutaneous Q8H   hydrALAZINE  75 mg Oral Q8H   insulin aspart  0-6 Units Subcutaneous TID WC  linaclotide  290 mcg Oral QAC breakfast   multivitamin  1 tablet Oral QHS   pantoprazole  40 mg Oral Q1200   polyethylene glycol  17 g Oral BID   senna-docusate  2 tablet Oral QHS   sodium bicarbonate  1,300 mg Oral BID   sodium chloride flush  3 mL Intravenous Q12H   Continuous Infusions: PRN Meds:.acetaminophen **OR** acetaminophen, albuterol, calcium carbonate (dosed in mg elemental calcium), camphor-menthol **AND** hydrOXYzine, hydrALAZINE, ondansetron **OR** ondansetron (ZOFRAN) IV, sorbitol, zolpidem   I have personally reviewed following labs and imaging studies  LABORATORY DATA: CBC: Recent Labs  Lab 03/13/23 0603  WBC 5.1  HGB 9.7*  HCT 29.9*  MCV 89.0  PLT PLATELET CLUMPS NOTED ON SMEAR, UNABLE TO ESTIMATE     Basic Metabolic Panel: Recent Labs  Lab 03/12/23 0555 03/13/23 0603  NA 135 133*  K 4.5 4.6  CL 101 101  CO2 23 21*  GLUCOSE 147* 133*  BUN 43* 42*  CREATININE 4.64* 4.64*  CALCIUM 8.5* 8.1*  PHOS 4.7* 3.8     GFR: Estimated Creatinine Clearance: 13.9 mL/min (A) (by C-G formula based  on SCr of 4.64 mg/dL (H)).  Liver Function Tests: Recent Labs  Lab 03/12/23 0555 03/13/23 0603  ALBUMIN 2.8* 2.6*    Recent Labs  Lab 03/12/23 1542  LIPASE 46    No results for input(s): "AMMONIA" in the last 168 hours.  Coagulation Profile: No results for input(s): "INR", "PROTIME" in the last 168 hours.  Cardiac Enzymes: No results for input(s): "CKTOTAL", "CKMB", "CKMBINDEX", "TROPONINI" in the last 168 hours.   BNP (last 3 results) No results for input(s): "PROBNP" in the last 8760 hours.  Lipid Profile: No results for input(s): "CHOL", "HDL", "LDLCALC", "TRIG", "CHOLHDL", "LDLDIRECT" in the last 72 hours.  Thyroid Function Tests: No results for input(s): "TSH", "T4TOTAL", "FREET4", "T3FREE", "THYROIDAB" in the last 72 hours.  Anemia Panel: No results for input(s): "VITAMINB12", "FOLATE", "FERRITIN", "TIBC", "IRON", "RETICCTPCT" in the last 72 hours.  Urine analysis:    Component Value Date/Time   COLORURINE YELLOW 03/07/2023 1250   APPEARANCEUR CLEAR 03/07/2023 1250   LABSPEC 1.013 03/07/2023 1250   PHURINE 5.0 03/07/2023 1250   GLUCOSEU 50 (A) 03/07/2023 1250   HGBUR MODERATE (A) 03/07/2023 1250   BILIRUBINUR NEGATIVE 03/07/2023 1250   KETONESUR 5 (A) 03/07/2023 1250   PROTEINUR >=300 (A) 03/07/2023 1250   UROBILINOGEN 0.2 08/13/2015 1030   NITRITE NEGATIVE 03/07/2023 1250   LEUKOCYTESUR NEGATIVE 03/07/2023 1250    Sepsis Labs: Lactic Acid, Venous    Component Value Date/Time   LATICACIDVEN 1.2 03/07/2023 1344    MICROBIOLOGY: No results found for this or any previous visit (from the past 240 hour(s)).   RADIOLOGY STUDIES/RESULTS: No results found.   LOS: 10 days   Jeoffrey Massed, MD  [facility]    To contact the attending provider between 7A-7P or the covering provider during after hours 7P-7A, please log into the web site [email or web address] and access using universal [place] password for that web site. If you do not have the  password, please call the hospital operator.  03/18/2023, 10:30 AM

## 2023-03-18 NOTE — Progress Notes (Signed)
Occupational Therapy Treatment Patient Details Name: Michael [name] MRN: [id number] DOB: 1951-04-07 Today's Date: 03/18/2023   History of present illness 72 year old male with history of HIV, homelessness, cocaine abuse, PAF, CKD stage IV, CAD status post CABG, IDDM who returns to the hospital on 03/07/23 after a recent admission on 02/14/23  with community-acquired pneumonia as well as suicidal ideation.  This admission pt presents with AMS, chest pain and CKD. He is well-known to our system with frequent ED visits, this being his 12th visit in the past month.   OT comments  Pt sitting up in recliner upon therapy arrival. Session with focus on activity tolerance, endurance, functional mobility, and balance while navigating throughout pt's room/environment. Pt did display one posterior LOB while navigating from recliner to sink while utilizing RW. Min guard provided to regain balance. VC were provided for form and technique during all activities. Rest breaks taken as needed due to fatigue. Pt continues to be appropriate for continued inpatient follow up therapy, <3 hours/day. OT will continue to follow patient acutely.    Recommendations for follow up therapy are one component of a multi-disciplinary discharge planning process, led by the attending physician.  Recommendations may be updated based on patient status, additional functional criteria and insurance authorization.    Assistance Recommended at Discharge Intermittent Supervision/Assistance  Patient can return home with the following  Assistance with cooking/housework;Help with stairs or ramp for entrance;Assist for transportation;Direct supervision/assist for financial management;Direct supervision/assist for medications management;A lot of help with bathing/dressing/bathroom;A little help with walking and/or transfers   Equipment Recommendations  Other (comment) (defer to next venue of care)    Recommendations for Other Services       Precautions / Restrictions Precautions Precautions: Fall Precaution Comments: 3 falls in past 6 months Restrictions Weight Bearing Restrictions: No       Mobility Bed Mobility     Patient Response: Flat affect, Cooperative  Transfers Overall transfer level: Needs assistance Equipment used: Rolling walker (2 wheels) Transfers: Sit to/from Stand, Bed to chair/wheelchair/BSC Sit to Stand: Min guard, From elevated surface     Step pivot transfers: Min guard     General transfer comment: Slight posterior LOB noted when completing functional mobility from recliner to sink. Min guard provided to correct.     Balance Overall balance assessment: History of Falls, Needs assistance Sitting-balance support: Feet supported, No upper extremity supported Sitting balance-Leahy Scale: Fair     Standing balance support: Single extremity supported, During functional activity Standing balance-Leahy Scale: Fair Standing balance comment: standing at sink to complete oral care        ADL either performed or assessed with clinical judgement   ADL Overall ADL's : Needs assistance/impaired     Grooming: Oral care;Supervision/safety;Standing            Cognition Arousal/Alertness: Awake/alert Behavior During Therapy: Flat affect Overall Cognitive Status: Impaired/Different from baseline Area of Impairment: Safety/judgement, Awareness, Problem solving, Memory      Memory: Decreased short-term memory   Safety/Judgement: Decreased awareness of deficits   Problem Solving: Slow processing, Requires verbal cues, Decreased initiation          Exercises Other Exercises Other Exercises: core strength: seated knee raises, alternately, 20X total Other Exercises: chair push-ups, 5X, 2 sets       General Comments VSS    Pertinent Vitals/ Pain       Pain Assessment Pain Assessment: Faces Faces Pain Scale: Hurts a little bit Pain Location: generalized pain (back, chest, legs,  etc) Pain Descriptors / Indicators: Discomfort, Grimacing, Guarding Pain Intervention(s): Monitored during session, Limited activity within patient's tolerance         Frequency  Min 1X/week        Progress Toward Goals  OT Goals(current goals can now be found in the care plan section)  Progress towards OT goals: Progressing toward goals     Plan Discharge plan remains appropriate;Frequency remains appropriate       AM-PAC OT "6 Clicks" Daily Activity     Outcome Measure   Help from another person eating meals?: A Little Help from another person taking care of personal grooming?: A Little Help from another person toileting, which includes using toliet, bedpan, or urinal?: A Lot Help from another person bathing (including washing, rinsing, drying)?: A Lot Help from another person to put on and taking off regular upper body clothing?: A Little Help from another person to put on and taking off regular lower body clothing?: A Lot 6 Click Score: 15    End of Session Equipment Utilized During Treatment: Gait belt;Rolling walker (2 wheels)  OT Visit Diagnosis: Unsteadiness on feet (R26.81);Muscle weakness (generalized) (M62.81);Pain Pain - Right/Left: Left Pain - part of body: Leg (abdominal pain)   Activity Tolerance Patient tolerated treatment well   Patient Left in chair;with call bell/phone within reach;with chair alarm set           Time: 1340-1400 OT Time Calculation (min): 20 min  Charges: OT General Charges $OT Visit: 1 Visit OT Treatments $Therapeutic Activity: 8-22 mins  Limmie Patricia, OTR/L,CBIS  Supplemental OT - [facility] and [facility] Secure Chat Preferred    Jamayah Myszka, Charisse March 03/18/2023, 2:05 PM

## 2023-03-19 DIAGNOSIS — R194 Change in bowel habit: Secondary | ICD-10-CM | POA: Diagnosis not present

## 2023-03-19 DIAGNOSIS — F1910 Other psychoactive substance abuse, uncomplicated: Secondary | ICD-10-CM | POA: Diagnosis not present

## 2023-03-19 DIAGNOSIS — I480 Paroxysmal atrial fibrillation: Secondary | ICD-10-CM | POA: Diagnosis not present

## 2023-03-19 DIAGNOSIS — J189 Pneumonia, unspecified organism: Secondary | ICD-10-CM | POA: Diagnosis not present

## 2023-03-19 DIAGNOSIS — R1084 Generalized abdominal pain: Secondary | ICD-10-CM | POA: Diagnosis not present

## 2023-03-19 DIAGNOSIS — B20 Human immunodeficiency virus [HIV] disease: Secondary | ICD-10-CM | POA: Diagnosis not present

## 2023-03-19 DIAGNOSIS — G9341 Metabolic encephalopathy: Secondary | ICD-10-CM | POA: Diagnosis not present

## 2023-03-19 LAB — GLUCOSE, CAPILLARY
Glucose-Capillary: 136 mg/dL — ABNORMAL HIGH (ref 70–99)
Glucose-Capillary: 153 mg/dL — ABNORMAL HIGH (ref 70–99)
Glucose-Capillary: 201 mg/dL — ABNORMAL HIGH (ref 70–99)
Glucose-Capillary: 136 mg/dL — ABNORMAL HIGH (ref 70–99)

## 2023-03-19 LAB — CBC WITH DIFFERENTIAL/PLATELET
Abs Immature Granulocytes: 0.14 10*3/uL — ABNORMAL HIGH (ref 0.00–0.07)
Basophils Absolute: 0 10*3/uL (ref 0.0–0.1)
Basophils Relative: 1 %
Eosinophils Absolute: 0.1 10*3/uL (ref 0.0–0.5)
Eosinophils Relative: 2 %
HCT: 30.1 % — ABNORMAL LOW (ref 39.0–52.0)
Hemoglobin: 9.4 g/dL — ABNORMAL LOW (ref 13.0–17.0)
Immature Granulocytes: 3 %
Lymphocytes Relative: 18 %
Lymphs Abs: 0.9 10*3/uL (ref 0.7–4.0)
MCH: 28.5 pg (ref 26.0–34.0)
MCHC: 31.2 g/dL (ref 30.0–36.0)
MCV: 91.2 fL (ref 80.0–100.0)
Monocytes Absolute: 0.4 10*3/uL (ref 0.1–1.0)
Monocytes Relative: 9 %
Neutro Abs: 3.3 10*3/uL (ref 1.7–7.7)
Neutrophils Relative %: 67 %
Platelets: 159 10*3/uL (ref 150–400)
RBC: 3.3 MIL/uL — ABNORMAL LOW (ref 4.22–5.81)
RDW: 15.8 % — ABNORMAL HIGH (ref 11.5–15.5)
WBC: 4.8 10*3/uL (ref 4.0–10.5)
nRBC: 0 % (ref 0.0–0.2)

## 2023-03-19 MED ORDER — PEG-KCL-NACL-NASULF-NA ASC-C 100 G PO SOLR: 1.0000 | Freq: Once | ORAL | Status: DC

## 2023-03-19 MED ORDER — PEG-KCL-NACL-NASULF-NA ASC-C 100 G PO SOLR
0.5000 | Freq: Once | ORAL | Status: AC
  Administered 2023-03-19: 100 g via ORAL
  Filled 2023-03-19: qty 1

## 2023-03-19 MED ORDER — SODIUM CHLORIDE 0.9 % IV SOLN: INTRAVENOUS | Status: DC

## 2023-03-19 NOTE — Progress Notes (Signed)
PROGRESS NOTE        PATIENT DETAILS Name: Michael [name] Age: 72 y.o. Sex: male Date of Birth: 1950/11/30 Admit Date: 03/07/2023 Admitting Physician Jonah Blue, MD ZOX:[id number], [name], MD  Brief Summary: Patient is a 72 y.o.  male with history of HFpEF, CAD s/p CABG, HTN, DM-2, PAF, COPD, mood disorder, HIV, cocaine use, homelessness-presented with confusion-in the setting of crack cocaine use and progression of CKD.  Patient was subsequently admitted to the hospitalist service.  See below for further details.  Significant events: 5/31>> admit to [facility] ([place]).  Significant studies: 5/31>> CT head: no acute abnormality. 5/31>>CT C-spine: No fracture 5/31>> CT chest: Mild bronchial thickening/tree-in-bud opacities in the right lower lobe concerning 5/31 >> CT abdomen/pelvis: No evidence for acute abdominal process.  For infectious/inflammatory process 6/5>> CT abdomen/pelvis: No acute abnormality-moderate stool burden  Significant microbiology data: 5/31>> blood culture: No growth  Procedures: None  Consults: Infectious disease Nephrology GI  Subjective: Less abdominal pain-had multiple BMs with bowel prep for colonoscopy-still brown in color.  Objective: Vitals: Blood pressure (!) 143/79, pulse 90, temperature 98.1 F (36.7 C), temperature source Oral, resp. rate 20, height 5\' 8"  (1.727 m), weight 68.5 kg, SpO2 100 %.   Exam: Gen Exam:Alert awake-not in any distress HEENT:atraumatic, normocephalic Chest: B/L clear to auscultation anteriorly CVS:S1S2 regular Abdomen:soft-mildly diffusely tender. Extremities:no edema Neurology: Non focal Skin: no rash  Pertinent Labs/Radiology:    Latest Ref Rng & Units 03/19/2023    9:42 AM 03/13/2023    6:03 AM 03/10/2023    6:22 AM  CBC  WBC 4.0 - 10.5 K/uL 4.8  5.1  4.2   Hemoglobin 13.0 - 17.0 g/dL 9.4  9.7  9.1   Hematocrit 39.0 - 52.0 % 30.1  29.9  27.1   Platelets 150 - 400 K/uL 159  PLATELET  CLUMPS NOTED ON SMEAR, UNABLE TO ESTIMATE  118     Lab Results  Component Value Date   NA 133 (L) 03/13/2023   K 4.6 03/13/2023   CL 101 03/13/2023   CO2 21 (L) 03/13/2023     Assessment/Plan: Acute toxic encephalopathy Secondary to crack cocaine use Resolved-completely awake/alert.  CKD 4 with progression to CKD 5 Stable with no indications for acute HD.  Per nephrology-poor HD candidate due to noncompliance homelessness. Follow renal function periodically.  Normocytic anemia Secondary to underlying CKD Follow CBC periodically  Abdominal pain Constipation-change in bowel habit Ongoing constipation for the past several months-worse due to constipation Significant change in bowel habit-more constipated than his baseline-no good response with numerous doses of laxatives/enemas-GI now consulted-plans for colonoscopy tomorrow-getting a 2-day prep  Acute on chronic HFpEF Euvolemic  CAD s/p CABG No anginal symptoms Continue aspirin/Lipitor  PAF Maintaining sinus rhythm Diltiazem Not a candidate for anticoagulation due to homelessness/noncompliance  HTN BP stable with hydralazine/Cardizem  DM-2 (A1c 6.8 on 12/25) CBG stable with SSI  Recent Labs    03/18/23 2109 03/19/23 0742 03/19/23 1108  GLUCAP 130* 136* 153*      HIV Antiretrovirals per ID.  COPD Not in exacerbation Continue bronchodilators  Mood disorder Cymbalta  Debility/deconditioning PT/OT eval recommending SNF-Per social work-will be difficult to place given history of drug use Plan is to continue PT/OT-once improves-he will be discharged back to his prior living situation.  Homelessness Lives in a [facility] shelter   Nutrition Status: Nutrition  Problem: Moderate Malnutrition Etiology: chronic illness Signs/Symptoms: moderate muscle depletion, mild fat depletion Interventions: Magic cup, MVI    BMI: Estimated body mass index is 22.96 kg/m as calculated from the following:   Height as of  this encounter: 5\' 8"  (1.727 m).   Weight as of this encounter: 68.5 kg.   Code status:   Code Status: Full Code   DVT Prophylaxis: heparin injection 5,000 Units Start: 03/07/23 1400   Family Communication: None at bedside  Disposition Plan: Status is: Inpatient Remains inpatient appropriate because: Severity of illness   Planned Discharge Destination:Skilled nursing facility   Diet: Diet Order             Diet NPO time specified  Diet effective midnight           Diet clear liquid Fluid consistency: Thin  Diet effective now                     Antimicrobial agents: Anti-infectives (From admission, onward)    Start     Dose/Rate Route Frequency Ordered Stop   03/14/23 0000  bictegravir-emtricitabine-tenofovir AF (BIKTARVY) 50-200-25 MG TABS tablet        1 tablet Oral Daily 03/14/23 1145     03/12/23 1200  bictegravir-emtricitabine-tenofovir AF (BIKTARVY) 50-200-25 MG per tablet 1 tablet        1 tablet Oral Daily with lunch 03/12/23 1023     03/11/23 1230  dolutegravir-rilpivirine (JULUCA) 50-25 MG per tablet 1 tablet  Status:  Discontinued        1 tablet Oral Daily with lunch 03/11/23 1131 03/12/23 1023   03/10/23 1130  bictegravir-emtricitabine-tenofovir AF (BIKTARVY) 50-200-25 MG per tablet 1 tablet  Status:  Discontinued        1 tablet Oral Daily with breakfast 03/10/23 1030 03/11/23 1131   03/08/23 0800  dolutegravir (TIVICAY) tablet 50 mg  Status:  Discontinued        50 mg Oral Daily before lunch 03/07/23 1521 03/10/23 1030   03/07/23 1700  rilpivirine (EDURANT) tablet 25 mg  Status:  Discontinued        25 mg Oral Daily with breakfast 03/07/23 1521 03/10/23 1030   03/07/23 1245  vancomycin (VANCOREADY) IVPB 1500 mg/300 mL        1,500 mg 150 mL/hr over 120 Minutes Intravenous  Once 03/07/23 1243 03/07/23 1639   03/07/23 1230  ceFEPIme (MAXIPIME) 2 g in sodium chloride 0.9 % 100 mL IVPB        2 g 200 mL/hr over 30 Minutes Intravenous  Once 03/07/23  1216 03/07/23 1435   03/07/23 1230  metroNIDAZOLE (FLAGYL) IVPB 500 mg        500 mg 100 mL/hr over 60 Minutes Intravenous  Once 03/07/23 1216 03/07/23 1439        MEDICATIONS: Scheduled Meds:  aspirin  81 mg Oral Daily   atorvastatin  40 mg Oral Daily   bictegravir-emtricitabine-tenofovir AF  1 tablet Oral Q lunch   diltiazem  180 mg Oral Daily   DULoxetine  30 mg Oral Daily   fluticasone furoate-vilanterol  1 puff Inhalation Daily   gabapentin  100 mg Oral BID   heparin  5,000 Units Subcutaneous Q8H   hydrALAZINE  75 mg Oral Q8H   insulin aspart  0-6 Units Subcutaneous TID WC   linaclotide  290 mcg Oral QAC breakfast   multivitamin  1 tablet Oral QHS   pantoprazole  40 mg Oral Q1200   peg 3350  powder  0.5 kit Oral Once   And   peg 3350 powder  0.5 kit Oral Once   polyethylene glycol  17 g Oral BID   senna-docusate  2 tablet Oral QHS   sodium bicarbonate  1,300 mg Oral BID   sodium chloride flush  3 mL Intravenous Q12H   Continuous Infusions:  sodium chloride     PRN Meds:.acetaminophen **OR** acetaminophen, albuterol, calcium carbonate (dosed in mg elemental calcium), camphor-menthol **AND** hydrOXYzine, hydrALAZINE, ondansetron **OR** ondansetron (ZOFRAN) IV, sorbitol, zolpidem   I have personally reviewed following labs and imaging studies  LABORATORY DATA: CBC: Recent Labs  Lab 03/13/23 0603 03/19/23 0942  WBC 5.1 4.8  NEUTROABS  --  3.3  HGB 9.7* 9.4*  HCT 29.9* 30.1*  MCV 89.0 91.2  PLT PLATELET CLUMPS NOTED ON SMEAR, UNABLE TO ESTIMATE 159     Basic Metabolic Panel: Recent Labs  Lab 03/13/23 0603  NA 133*  K 4.6  CL 101  CO2 21*  GLUCOSE 133*  BUN 42*  CREATININE 4.64*  CALCIUM 8.1*  PHOS 3.8     GFR: Estimated Creatinine Clearance: 13.9 mL/min (A) (by C-G formula based on SCr of 4.64 mg/dL (H)).  Liver Function Tests: Recent Labs  Lab 03/13/23 0603  ALBUMIN 2.6*    Recent Labs  Lab 03/12/23 1542  LIPASE 46    No results  for input(s): "AMMONIA" in the last 168 hours.  Coagulation Profile: No results for input(s): "INR", "PROTIME" in the last 168 hours.  Cardiac Enzymes: No results for input(s): "CKTOTAL", "CKMB", "CKMBINDEX", "TROPONINI" in the last 168 hours.   BNP (last 3 results) No results for input(s): "PROBNP" in the last 8760 hours.  Lipid Profile: No results for input(s): "CHOL", "HDL", "LDLCALC", "TRIG", "CHOLHDL", "LDLDIRECT" in the last 72 hours.  Thyroid Function Tests: No results for input(s): "TSH", "T4TOTAL", "FREET4", "T3FREE", "THYROIDAB" in the last 72 hours.  Anemia Panel: No results for input(s): "VITAMINB12", "FOLATE", "FERRITIN", "TIBC", "IRON", "RETICCTPCT" in the last 72 hours.  Urine analysis:    Component Value Date/Time   COLORURINE YELLOW 03/07/2023 1250   APPEARANCEUR CLEAR 03/07/2023 1250   LABSPEC 1.013 03/07/2023 1250   PHURINE 5.0 03/07/2023 1250   GLUCOSEU 50 (A) 03/07/2023 1250   HGBUR MODERATE (A) 03/07/2023 1250   BILIRUBINUR NEGATIVE 03/07/2023 1250   KETONESUR 5 (A) 03/07/2023 1250   PROTEINUR >=300 (A) 03/07/2023 1250   UROBILINOGEN 0.2 08/13/2015 1030   NITRITE NEGATIVE 03/07/2023 1250   LEUKOCYTESUR NEGATIVE 03/07/2023 1250    Sepsis Labs: Lactic Acid, Venous    Component Value Date/Time   LATICACIDVEN 1.2 03/07/2023 1344    MICROBIOLOGY: No results found for this or any previous visit (from the past 240 hour(s)).   RADIOLOGY STUDIES/RESULTS: No results found.   LOS: 11 days   Jeoffrey Massed, MD  [facility]    To contact the attending provider between 7A-7P or the covering provider during after hours 7P-7A, please log into the web site [email or web address] and access using universal [place] password for that web site. If you do not have the password, please call the hospital operator.  03/19/2023, 1:08 PM

## 2023-03-19 NOTE — TOC Progression Note (Signed)
Transition of Care [facility]) - Progression Note    Patient Details  Name: Michael [name] MRN: [id number] Date of Birth: 1951-03-28  Transition of Care [facility]) CM/SW Contact  Gordy Clement, RN Phone Number: 03/19/2023, 3:52 PM  Clinical Narrative:     Order placed for rollator to be delivered bedside prior to DC.  [employer] to deliver. TOC will continue to follow patient for any additional discharge needs       Expected Discharge Plan: Skilled Nursing Facility Barriers to Discharge: Continued Medical Work up, Homeless with medical needs, Active Substance Use - Placement, SNF Pending bed offer, English as a second language teacher  Expected Discharge Plan and Services                                               Social Determinants of Health (SDOH) Interventions SDOH Screenings   Food Insecurity: Food Insecurity Present (03/07/2023)  Housing: Patient Unable To Answer (03/07/2023)  Recent Concern: Housing - Medium Risk (02/14/2023)  Transportation Needs: Unmet Transportation Needs (03/07/2023)  Utilities: Not At Risk (03/07/2023)  Alcohol Screen: Low Risk  (11/27/2022)  Depression (PHQ2-9): High Risk (11/12/2021)  Financial Resource Strain: Medium Risk (09/16/2018)  Physical Activity: Unknown (04/22/2019)  Social Connections: Socially Isolated (09/16/2018)  Stress: Stress Concern Present (09/16/2018)  Tobacco Use: Low Risk  (03/07/2023)    Readmission Risk Interventions    03/14/2023    3:46 PM 02/17/2023    1:01 PM 07/05/2022   10:08 AM  Readmission Risk Prevention Plan  Transportation Screening Complete Complete Complete  Medication Review [employer]ographer) Complete Complete Complete  PCP or Specialist appointment within 3-5 days of discharge Complete Complete Complete  HRI or Home Care Consult Complete Complete Complete  SW Recovery Care/Counseling Consult Complete Complete Complete  Palliative Care Screening Not Applicable Not Applicable Not Applicable  Skilled Nursing  Facility Complete Not Applicable Not Applicable

## 2023-03-19 NOTE — Progress Notes (Addendum)
     Attending physician's note   I have taken a history, reviewed the chart, and examined the patient. I performed a substantive portion of this encounter, including complete performance of at least one of the key components, in conjunction with the APP. I agree with the APP's note, impression, and recommendations with my edits.   No acute events overnight.  Tolerating prep without issue.  Has been having bowel movements, but not yet clearing.  Plan for continued bowel preparation this evening.  I told him to contact the nurse if stools still not clearing after additional bowel preparation, and may necessitate either additional bowel prep, enema, or potentially postponing procedure by a day.  He understands.  Repeat CBC this morning stable with H/H 9.4/30.1.  Tentative plan for EGD/colonoscopy tomorrow pending adequacy of bowel preparation.  [address] DO, FACG [phone] office          Progress Note   Subjective   Chief Complaint: Change in bowel habits and abdominal pain  Today, patient tells me that he is not doing his clear liquid diet, he was able to get an entire bowel prep yesterday and knows about plans for second 1 today.  He tells me that he is moving his bowels but they still are brown in appearance.  Explained that we would like him really getting cleaned out and so his procedures are scheduled for tomorrow.  He verbalized understanding.  No new complaints or concerns.    Objective   Vital signs in last 24 hours: Temp:  [97.6 F (36.4 C)-98.4 F (36.9 C)] 98.1 F (36.7 C) (06/12 1109) Pulse Rate:  [78-90] 90 (06/12 0800) Resp:  [15-20] 20 (06/12 1109) BP: (129-160)/(77-86) 143/79 (06/12 1109) SpO2:  [96 %-100 %] 100 % (06/12 1109) Weight:  [68.5 kg] 68.5 kg (06/12 0325) Last BM Date : 03/19/23 General:    AA male in NAD Heart:  Regular rate and rhythm; no murmurs Lungs: Respirations even and unlabored, lungs CTA bilaterally Abdomen:  Soft, nontender  and nondistended. Normal bowel sounds. Psych:  Cooperative. Normal mood and affect.   Lab Results: Recent Labs    03/19/23 0942  WBC 4.8  HGB 9.4*  HCT 30.1*  PLT 159     Assessment / Plan:   Assessment: 1.  Generalized abdominal pain: Over the past 2 to 3 months with a change in bowel habits towards constipation, history of cocaine use which could be related versus constipation 2.  Constipation 3.  Iron deficiency anemia: No overt signs of GI bleeding 4.  CKD 4 with progression to CKD 5 5.  Acute on chronic heart failure preserved EF 6.  CAD status post CABG: On aspirin 7.  A-fib 8. Homelessness  Plan: 1.  EGD and colonoscopy planned for tomorrow to allow patient a 2-day bowel prep given his history of constipation. 2.  Patient will continue on a clear liquid diet today and completed another movie prep this afternoon.  Did discuss this with him at time of my interview today. 3.  Patient be n.p.o. at midnight 4.  Continue to monitor hemoglobin with transfusion as needed.  Will add in a.m. labs including CBC/ CMP as patient be having procedures.  Thank you for your kind consultation, we will continue to follow   LOS: 11 days   Unk Lightning  03/19/2023, 11:25 AM

## 2023-03-19 NOTE — Progress Notes (Signed)
Physical Therapy Treatment Patient Details Name: Michael [name] MRN: [id number] DOB: 04-05-1951 Today's Date: 03/19/2023   History of Present Illness 72 year old male with history of HIV, homelessness, cocaine abuse, PAF, CKD stage IV, CAD status post CABG, IDDM who returns to the hospital on 03/07/23 after a recent admission on 02/14/23  with community-acquired pneumonia as well as suicidal ideation.  This admission pt presents with AMS, chest pain and CKD. He is well-known to our system with frequent ED visits, this being his 12th visit in the past month.    PT Comments    Pt with steady progress this session, able to advance gait distance and progress to rollator for support during gait trial. Pt with x1 mild LOB when turning to sit with RW for support with min A needed to recover and steady and prevent fall. Pt with no LOB with rolaltor use and demonstrating improved ability to negotiate around obstacles. Pt able o recall and demonstrate safe rolaltor brake use with sit<>stand. Current plan remains appropriate to address deficits and maximize functional independence and decrease caregiver burden as pt continues to remain at risk for falls. Pt continues to benefit from skilled PT services to progress toward functional mobility goals.    Recommendations for follow up therapy are one component of a multi-disciplinary discharge planning process, led by the attending physician.  Recommendations may be updated based on patient status, additional functional criteria and insurance authorization.  Follow Up Recommendations  Can patient physically be transported by private vehicle: No    Assistance Recommended at Discharge Frequent or constant Supervision/Assistance  Patient can return home with the following A lot of help with walking and/or transfers;A little help with bathing/dressing/bathroom;Assistance with cooking/housework;Direct supervision/assist for medications management;Assist for  transportation;Help with stairs or ramp for entrance;Direct supervision/assist for financial management   Equipment Recommendations  Rollator (4 wheels)    Recommendations for Other Services       Precautions / Restrictions Precautions Precautions: Fall Precaution Comments: 3 falls in past 6 months Restrictions Weight Bearing Restrictions: No     Mobility  Bed Mobility Overal bed mobility: Needs Assistance             General bed mobility comments: pt up in chair on arrival    Transfers Overall transfer level: Needs assistance Equipment used: Rolling walker (2 wheels), Rollator (4 wheels) Transfers: Sit to/from Stand, Bed to chair/wheelchair/BSC Sit to Stand: Min guard, Min assist           General transfer comment: min gaurd from EOB, min A from low commode, min A to sit safety in hall for rest break as pt with mild LOB when turning needing assist to maintain balance before coming to sit    Ambulation/Gait Ambulation/Gait assistance: Min assist, Min guard Gait Distance (Feet): 130 Feet (x2) Assistive device: Rolling walker (2 wheels), Rollator (4 wheels) Gait Pattern/deviations: Decreased stride length, Trunk flexed, Narrow base of support, Decreased dorsiflexion - right, Decreased dorsiflexion - left, Drifts right/left, Step-through pattern Gait velocity: decreased     General Gait Details: slow gait with RW and rollator, cues for rollator proximity, pt with improved navigation around obstacles with rollator vs RW,   Stairs             Wheelchair Mobility    Modified Rankin (Stroke Patients Only)       Balance Overall balance assessment: History of Falls, Needs assistance Sitting-balance support: Feet supported, No upper extremity supported Sitting balance-Leahy Scale: Fair     Standing  balance support: Single extremity supported, During functional activity Standing balance-Leahy Scale: Fair Standing balance comment: standing at sink to  wash hands with single UE support                            Cognition Arousal/Alertness: Awake/alert Behavior During Therapy: WFL for tasks assessed/performed Overall Cognitive Status: Impaired/Different from baseline Area of Impairment: Safety/judgement, Awareness, Problem solving, Memory                     Memory: Decreased short-term memory   Safety/Judgement: Decreased awareness of deficits Awareness: Emergent Problem Solving: Slow processing, Requires verbal cues, Decreased initiation          Exercises      General Comments General comments (skin integrity, edema, etc.): VSS on RA      Pertinent Vitals/Pain Pain Assessment Pain Assessment: Faces Faces Pain Scale: Hurts little more Pain Location: abdominal discomfort Pain Descriptors / Indicators: Discomfort, Grimacing, Guarding Pain Intervention(s): Monitored during session, Limited activity within patient's tolerance    Home Living                          Prior Function            PT Goals (current goals can now be found in the care plan section) Acute Rehab PT Goals Patient Stated Goal: get stronger PT Goal Formulation: With patient Time For Goal Achievement: 03/22/23 Progress towards PT goals: Progressing toward goals    Frequency    Min 4X/week      PT Plan Current plan remains appropriate;Frequency needs to be updated    Co-evaluation              AM-PAC PT "6 Clicks" Mobility   Outcome Measure  Help needed turning from your back to your side while in a flat bed without using bedrails?: A Little Help needed moving from lying on your back to sitting on the side of a flat bed without using bedrails?: A Little Help needed moving to and from a bed to a chair (including a wheelchair)?: A Little Help needed standing up from a chair using your arms (e.g., wheelchair or bedside chair)?: A Little Help needed to walk in hospital room?: A Little Help needed  climbing 3-5 steps with a railing? : Total 6 Click Score: 16    End of Session Equipment Utilized During Treatment: Gait belt Activity Tolerance: Patient tolerated treatment well;Patient limited by fatigue Patient left: with call bell/phone within reach;in chair;with chair alarm set Nurse Communication: Mobility status PT Visit Diagnosis: Muscle weakness (generalized) (M62.81);History of falling (Z91.81);Difficulty in walking, not elsewhere classified (R26.2)     Time: 1610-9604 PT Time Calculation (min) (ACUTE ONLY): 25 min  Charges:  $Gait Training: 23-37 mins                     Darel Ricketts R. PTA Acute Rehabilitation Services Office: [phone]   Catalina Antigua 03/19/2023, 12:29 PM

## 2023-03-19 NOTE — H&P (View-Only) (Signed)
     Attending physician's note   I have taken a history, reviewed the chart, and examined the patient. I performed a substantive portion of this encounter, including complete performance of at least one of the key components, in conjunction with the APP. I agree with the APP's note, impression, and recommendations with my edits.   No acute events overnight.  Tolerating prep without issue.  Has been having bowel movements, but not yet clearing.  Plan for continued bowel preparation this evening.  I told him to contact the nurse if stools still not clearing after additional bowel preparation, and may necessitate either additional bowel prep, enema, or potentially postponing procedure by a day.  He understands.  Repeat CBC this morning stable with H/H 9.4/30.1.  Tentative plan for EGD/colonoscopy tomorrow pending adequacy of bowel preparation.  Sarabelle Genson, DO, FACG [phone] office          Progress Note   Subjective   Chief Complaint: Change in bowel habits and abdominal pain  Today, patient tells me that he is not doing his clear liquid diet, he was able to get an entire bowel prep yesterday and knows about plans for second 1 today.  He tells me that he is moving his bowels but they still are brown in appearance.  Explained that we would like him really getting cleaned out and so his procedures are scheduled for tomorrow.  He verbalized understanding.  No new complaints or concerns.    Objective   Vital signs in last 24 hours: Temp:  [97.6 F (36.4 C)-98.4 F (36.9 C)] 98.1 F (36.7 C) (06/12 1109) Pulse Rate:  [78-90] 90 (06/12 0800) Resp:  [15-20] 20 (06/12 1109) BP: (129-160)/(77-86) 143/79 (06/12 1109) SpO2:  [96 %-100 %] 100 % (06/12 1109) Weight:  [68.5 kg] 68.5 kg (06/12 0325) Last BM Date : 03/19/23 General:    AA male in NAD Heart:  Regular rate and rhythm; no murmurs Lungs: Respirations even and unlabored, lungs CTA bilaterally Abdomen:  Soft, nontender  and nondistended. Normal bowel sounds. Psych:  Cooperative. Normal mood and affect.   Lab Results: Recent Labs    03/19/23 0942  WBC 4.8  HGB 9.4*  HCT 30.1*  PLT 159     Assessment / Plan:   Assessment: 1.  Generalized abdominal pain: Over the past 2 to 3 months with a change in bowel habits towards constipation, history of cocaine use which could be related versus constipation 2.  Constipation 3.  Iron deficiency anemia: No overt signs of GI bleeding 4.  CKD 4 with progression to CKD 5 5.  Acute on chronic heart failure preserved EF 6.  CAD status post CABG: On aspirin 7.  A-fib 8. Homelessness  Plan: 1.  EGD and colonoscopy planned for tomorrow to allow patient a 2-day bowel prep given his history of constipation. 2.  Patient will continue on a clear liquid diet today and completed another movie prep this afternoon.  Did discuss this with him at time of my interview today. 3.  Patient be n.p.o. at midnight 4.  Continue to monitor hemoglobin with transfusion as needed.  Will add in a.m. labs including CBC/ CMP as patient be having procedures.  Thank you for your kind consultation, we will continue to follow   LOS: 11 days   Jennifer Lynne Lemmon  03/19/2023, 11:25 AM    

## 2023-03-19 NOTE — Progress Notes (Signed)
Mobility Specialist Progress Note   03/19/23 1300  Mobility  Activity Ambulated with assistance in hallway;Ambulated with assistance in room;Transferred from chair to bed  Level of Assistance Contact guard assist, steadying assist  Assistive Device Front wheel walker  Distance Ambulated (ft) 44 ft  Activity Response Tolerated well  Mobility Referral Yes  $Mobility charge 1 Mobility  Mobility Specialist Start Time (ACUTE ONLY) 1300  Mobility Specialist Stop Time (ACUTE ONLY) 1320  Mobility Specialist Time Calculation (min) (ACUTE ONLY) 20 min   Received pt in chair requesting to get back to bed d/t tiredness but agreeable to ambulation before getting back. Pt requring increased time throughout but no physical assist. Gait slow but steady throughout. Returned to bed w/o fault, call bell in reach and bed alarm on.  Frederico Hamman Mobility Specialist Please contact via SecureChat or  Rehab office at [phone]'

## 2023-03-20 ENCOUNTER — Encounter (HOSPITAL_COMMUNITY): Payer: Self-pay | Admitting: Internal Medicine

## 2023-03-20 ENCOUNTER — Encounter (HOSPITAL_COMMUNITY)
Admission: EM | Disposition: A | Discharge status: Home / Self Care | Payer: Self-pay | Source: Home / Self Care | Attending: Internal Medicine

## 2023-03-20 ENCOUNTER — Other Ambulatory Visit (HOSPITAL_COMMUNITY): Payer: Self-pay

## 2023-03-20 ENCOUNTER — Inpatient Hospital Stay (HOSPITAL_COMMUNITY): Payer: 59 | Admitting: Registered Nurse

## 2023-03-20 DIAGNOSIS — K2970 Gastritis, unspecified, without bleeding: Secondary | ICD-10-CM

## 2023-03-20 DIAGNOSIS — D124 Benign neoplasm of descending colon: Secondary | ICD-10-CM

## 2023-03-20 DIAGNOSIS — F1910 Other psychoactive substance abuse, uncomplicated: Secondary | ICD-10-CM | POA: Diagnosis not present

## 2023-03-20 DIAGNOSIS — K648 Other hemorrhoids: Secondary | ICD-10-CM

## 2023-03-20 DIAGNOSIS — D509 Iron deficiency anemia, unspecified: Secondary | ICD-10-CM

## 2023-03-20 DIAGNOSIS — B20 Human immunodeficiency virus [HIV] disease: Secondary | ICD-10-CM | POA: Diagnosis not present

## 2023-03-20 DIAGNOSIS — R12 Heartburn: Secondary | ICD-10-CM | POA: Diagnosis not present

## 2023-03-20 DIAGNOSIS — I4891 Unspecified atrial fibrillation: Secondary | ICD-10-CM

## 2023-03-20 DIAGNOSIS — I480 Paroxysmal atrial fibrillation: Secondary | ICD-10-CM | POA: Diagnosis not present

## 2023-03-20 DIAGNOSIS — I2699 Other pulmonary embolism without acute cor pulmonale: Secondary | ICD-10-CM

## 2023-03-20 DIAGNOSIS — D126 Benign neoplasm of colon, unspecified: Secondary | ICD-10-CM

## 2023-03-20 DIAGNOSIS — R194 Change in bowel habit: Secondary | ICD-10-CM | POA: Diagnosis not present

## 2023-03-20 DIAGNOSIS — K3189 Other diseases of stomach and duodenum: Secondary | ICD-10-CM

## 2023-03-20 DIAGNOSIS — G9341 Metabolic encephalopathy: Secondary | ICD-10-CM | POA: Diagnosis not present

## 2023-03-20 DIAGNOSIS — R110 Nausea: Secondary | ICD-10-CM | POA: Diagnosis not present

## 2023-03-20 DIAGNOSIS — K640 First degree hemorrhoids: Secondary | ICD-10-CM

## 2023-03-20 HISTORY — PX: POLYPECTOMY: SHX5525

## 2023-03-20 HISTORY — PX: ESOPHAGOGASTRODUODENOSCOPY (EGD) WITH PROPOFOL: SHX5813

## 2023-03-20 HISTORY — PX: COLONOSCOPY WITH PROPOFOL: SHX5780

## 2023-03-20 HISTORY — PX: BIOPSY: SHX5522

## 2023-03-20 LAB — CBC WITH DIFFERENTIAL/PLATELET
Abs Immature Granulocytes: 0.09 10*3/uL — ABNORMAL HIGH (ref 0.00–0.07)
Basophils Absolute: 0 10*3/uL (ref 0.0–0.1)
Basophils Relative: 1 %
Eosinophils Absolute: 0.1 10*3/uL (ref 0.0–0.5)
Eosinophils Relative: 2 %
HCT: 29.8 % — ABNORMAL LOW (ref 39.0–52.0)
Hemoglobin: 9.6 g/dL — ABNORMAL LOW (ref 13.0–17.0)
Immature Granulocytes: 3 %
Lymphocytes Relative: 19 %
Lymphs Abs: 0.7 10*3/uL (ref 0.7–4.0)
MCH: 29.4 pg (ref 26.0–34.0)
MCHC: 32.2 g/dL (ref 30.0–36.0)
MCV: 91.4 fL (ref 80.0–100.0)
Monocytes Absolute: 0.4 10*3/uL (ref 0.1–1.0)
Monocytes Relative: 10 %
Neutro Abs: 2.4 10*3/uL (ref 1.7–7.7)
Neutrophils Relative %: 65 %
Platelets: 149 10*3/uL — ABNORMAL LOW (ref 150–400)
RBC: 3.26 MIL/uL — ABNORMAL LOW (ref 4.22–5.81)
RDW: 15.7 % — ABNORMAL HIGH (ref 11.5–15.5)
WBC: 3.6 10*3/uL — ABNORMAL LOW (ref 4.0–10.5)
nRBC: 0 % (ref 0.0–0.2)

## 2023-03-20 LAB — COMPREHENSIVE METABOLIC PANEL
ALT: 27 U/L (ref 0–44)
AST: 24 U/L (ref 15–41)
Albumin: 2.7 g/dL — ABNORMAL LOW (ref 3.5–5.0)
Alkaline Phosphatase: 73 U/L (ref 38–126)
Anion gap: 12 (ref 5–15)
BUN: 39 mg/dL — ABNORMAL HIGH (ref 8–23)
CO2: 23 mmol/L (ref 22–32)
Calcium: 8.3 mg/dL — ABNORMAL LOW (ref 8.9–10.3)
Chloride: 104 mmol/L (ref 98–111)
Creatinine, Ser: 4.34 mg/dL — ABNORMAL HIGH (ref 0.61–1.24)
GFR, Estimated: 14 mL/min — ABNORMAL LOW (ref >60)
Glucose, Bld: 112 mg/dL — ABNORMAL HIGH (ref 70–99)
Potassium: 3.8 mmol/L (ref 3.5–5.1)
Sodium: 139 mmol/L (ref 135–145)
Total Bilirubin: 0.8 mg/dL (ref 0.3–1.2)
Total Protein: 5.9 g/dL — ABNORMAL LOW (ref 6.5–8.1)

## 2023-03-20 LAB — GLUCOSE, CAPILLARY
Glucose-Capillary: 107 mg/dL — ABNORMAL HIGH (ref 70–99)
Glucose-Capillary: 100 mg/dL — ABNORMAL HIGH (ref 70–99)
Glucose-Capillary: 102 mg/dL — ABNORMAL HIGH (ref 70–99)
Glucose-Capillary: 204 mg/dL — ABNORMAL HIGH (ref 70–99)
Glucose-Capillary: 144 mg/dL — ABNORMAL HIGH (ref 70–99)

## 2023-03-20 SURGERY — ESOPHAGOGASTRODUODENOSCOPY (EGD) WITH PROPOFOL
Anesthesia: Monitor Anesthesia Care

## 2023-03-20 MED ORDER — PROPOFOL 500 MG/50ML IV EMUL
INTRAVENOUS | Status: DC | PRN
  Administered 2023-03-20: 100 ug/kg/min via INTRAVENOUS

## 2023-03-20 MED ORDER — SENNOSIDES-DOCUSATE SODIUM 8.6-50 MG PO TABS
2.0000 | ORAL_TABLET | Freq: Two times a day (BID) | ORAL | Status: DC
  Administered 2023-03-21: 2 via ORAL
  Filled 2023-03-20 (×6): qty 2

## 2023-03-20 MED ORDER — PROPOFOL 10 MG/ML IV BOLUS
INTRAVENOUS | Status: DC | PRN
  Administered 2023-03-20: 25 mg via INTRAVENOUS
  Administered 2023-03-20 (×2): 50 mg via INTRAVENOUS
  Administered 2023-03-20: 25 mg via INTRAVENOUS

## 2023-03-20 MED ORDER — LIDOCAINE 2% (20 MG/ML) 5 ML SYRINGE
INTRAMUSCULAR | Status: DC | PRN
  Administered 2023-03-20: 60 mg via INTRAVENOUS

## 2023-03-20 MED ORDER — BISACODYL 10 MG RE SUPP: 10.0000 mg | Freq: Every day | RECTAL | Status: DC | PRN

## 2023-03-20 MED ORDER — PHENYLEPHRINE 80 MCG/ML (10ML) SYRINGE FOR IV PUSH (FOR BLOOD PRESSURE SUPPORT)
PREFILLED_SYRINGE | INTRAVENOUS | Status: DC | PRN
  Administered 2023-03-20 (×2): 160 ug via INTRAVENOUS

## 2023-03-20 SURGICAL SUPPLY — 25 items
BLOCK BITE 60FR ADLT L/F BLUE (MISCELLANEOUS) ×2 IMPLANT
ELECT REM PT RETURN 9FT ADLT (ELECTROSURGICAL)
ELECTRODE REM PT RTRN 9FT ADLT (ELECTROSURGICAL) IMPLANT
FCP BXJMBJMB 240X2.8X (CUTTING FORCEPS)
FLOOR PAD 36X40 (MISCELLANEOUS) ×2
FORCEP RJ3 GP 1.8X160 W-NEEDLE (CUTTING FORCEPS) IMPLANT
FORCEPS BIOP RAD 4 LRG CAP 4 (CUTTING FORCEPS) IMPLANT
FORCEPS BIOP RJ4 240 W/NDL (CUTTING FORCEPS)
FORCEPS BXJMBJMB 240X2.8X (CUTTING FORCEPS) IMPLANT
INJECTOR/SNARE I SNARE (MISCELLANEOUS) IMPLANT
LUBRICANT JELLY 4.5OZ STERILE (MISCELLANEOUS) IMPLANT
MANIFOLD NEPTUNE II (INSTRUMENTS) IMPLANT
NDL SCLEROTHERAPY 25GX240 (NEEDLE) IMPLANT
NEEDLE SCLEROTHERAPY 25GX240 (NEEDLE) IMPLANT
PAD FLOOR 36X40 (MISCELLANEOUS) ×2 IMPLANT
PROBE APC STR FIRE (PROBE) IMPLANT
PROBE INJECTION GOLD (MISCELLANEOUS)
PROBE INJECTION GOLD 7FR (MISCELLANEOUS) IMPLANT
SNARE ROTATE MED OVAL 20MM (MISCELLANEOUS) IMPLANT
SNARE SHORT THROW 13M SML OVAL (MISCELLANEOUS) IMPLANT
SYR 50ML LL SCALE MARK (SYRINGE) IMPLANT
TRAP SPECIMEN MUCOUS 40CC (MISCELLANEOUS) IMPLANT
TUBING ENDO SMARTCAP PENTAX (MISCELLANEOUS) ×4 IMPLANT
TUBING IRRIGATION ENDOGATOR (MISCELLANEOUS) ×2 IMPLANT
WATER STERILE IRR 1000ML POUR (IV SOLUTION) IMPLANT

## 2023-03-20 NOTE — Progress Notes (Signed)
PROGRESS NOTE        PATIENT DETAILS Name: Michael [name] Age: 72 y.o. Sex: male Date of Birth: 01/11/1951 Admit Date: 03/07/2023 Admitting Physician Jonah Blue, MD YNW:GNFAO, Michael Genre., MD  Brief Summary: Patient is a 72 y.o.  male with history of HFpEF, CAD s/p CABG, HTN, DM-2, PAF, COPD, mood disorder, HIV, cocaine use, homelessness-presented with confusion-in the setting of crack cocaine use and progression of CKD.  Patient was subsequently admitted to the hospitalist service.  See below for further details.  Significant events: 5/31>> admit to [facility].  Significant studies: 5/31>> CT head: no acute abnormality. 5/31>>CT C-spine: No fracture 5/31>> CT chest: Mild bronchial thickening/tree-in-bud opacities in the right lower lobe concerning 5/31 >> CT abdomen/pelvis: No evidence for acute abdominal process.  For infectious/inflammatory process 6/5>> CT abdomen/pelvis: No acute abnormality-moderate stool burden  Significant microbiology data: 5/31>> blood culture: No growth  Procedures: 6/13>> colonoscopy: 2 polyps descending colon, nonbleeding internal hemorrhoids 6/13>> EGD: Gastritis/erythematous duodenopathy  Consults: Infectious disease Nephrology GI  Subjective: Less abdominal pain-numerous BMs with colonoscopy prep.  Objective: Vitals: Blood pressure 137/71, pulse 72, temperature 97.6 F (36.4 C), resp. rate 13, height 5\' 8"  (1.727 m), weight 72.1 kg, SpO2 99 %.   Exam: Gen Exam:Alert awake-not in any distress HEENT:atraumatic, normocephalic Chest: B/L clear to auscultation anteriorly CVS:S1S2 regular Abdomen: Soft-mild diffuse tenderness persists. Extremities:no edema Neurology: Non focal Skin: no rash  Pertinent Labs/Radiology:    Latest Ref Rng & Units 03/20/2023    5:49 AM 03/19/2023    9:42 AM 03/13/2023    6:03 AM  CBC  WBC 4.0 - 10.5 K/uL 3.6  4.8  5.1   Hemoglobin 13.0 - 17.0 g/dL 9.6  9.4  9.7   Hematocrit 39.0 -  52.0 % 29.8  30.1  29.9   Platelets 150 - 400 K/uL 149  159  PLATELET CLUMPS NOTED ON SMEAR, UNABLE TO ESTIMATE     Lab Results  Component Value Date   NA 139 03/20/2023   K 3.8 03/20/2023   CL 104 03/20/2023   CO2 23 03/20/2023     Assessment/Plan: Acute toxic encephalopathy Secondary to crack cocaine use Resolved-completely awake/alert.  CKD 4 with progression to CKD 5 Stable with no indications for acute HD.  Per nephrology-poor HD candidate due to noncompliance homelessness. Follow renal function periodically.  Normocytic anemia Secondary to underlying CKD Follow CBC periodically  Abdominal pain Constipation-change in bowel habit EGD/colonoscopy as above-follow biopsy results Abdominal pain is chronic going on for several months-workup as above-doubt further workup is required Ensure patient is on a bowel regimen with MiraLAX/senna-continue Linzess.  Acute on chronic HFpEF Euvolemic  CAD s/p CABG No anginal symptoms Continue aspirin/Lipitor  PAF Maintaining sinus rhythm Diltiazem Not a candidate for anticoagulation due to homelessness/noncompliance  HTN BP stable with hydralazine/Cardizem  DM-2 (A1c 6.8 on 12/25) CBG stable with SSI  Recent Labs    03/19/23 2020 03/20/23 0809 03/20/23 1015  GLUCAP 136* 107* 100*      HIV Antiretrovirals per ID.  COPD Not in exacerbation Continue bronchodilators  Mood disorder Cymbalta  Debility/deconditioning PT/OT eval recommending SNF-Per social work-will be difficult to place given history of drug use Plan is to continue PT/OT-once improves-he will be discharged back to his prior living situation.  Homelessness Lives in a [facility] shelter   Nutrition Status: Nutrition Problem: Moderate  Malnutrition Etiology: chronic illness Signs/Symptoms: moderate muscle depletion, mild fat depletion Interventions: Magic cup, MVI    BMI: Estimated body mass index is 24.17 kg/m as calculated from the following:    Height as of this encounter: 5\' 8"  (1.727 m).   Weight as of this encounter: 72.1 kg.   Code status:   Code Status: Full Code   DVT Prophylaxis: heparin injection 5,000 Units Start: 03/07/23 1400   Family Communication: None at bedside  Disposition Plan: Status is: Inpatient Remains inpatient appropriate because: Severity of illness   Planned Discharge Destination:Skilled nursing facility   Diet: Diet Order             Diet heart healthy/carb modified Fluid consistency: Thin  Diet effective now                     Antimicrobial agents: Anti-infectives (From admission, onward)    Start     Dose/Rate Route Frequency Ordered Stop   03/14/23 0000  bictegravir-emtricitabine-tenofovir AF (BIKTARVY) 50-200-25 MG TABS tablet        1 tablet Oral Daily 03/14/23 1145     03/12/23 1200  [MAR Hold]  bictegravir-emtricitabine-tenofovir AF (BIKTARVY) 50-200-25 MG per tablet 1 tablet        (MAR Hold since Thu 03/20/2023 at 0843.Hold Reason: Transfer to a Procedural area)   1 tablet Oral Daily with lunch 03/12/23 1023     03/11/23 1230  dolutegravir-rilpivirine (JULUCA) 50-25 MG per tablet 1 tablet  Status:  Discontinued        1 tablet Oral Daily with lunch 03/11/23 1131 03/12/23 1023   03/10/23 1130  bictegravir-emtricitabine-tenofovir AF (BIKTARVY) 50-200-25 MG per tablet 1 tablet  Status:  Discontinued        1 tablet Oral Daily with breakfast 03/10/23 1030 03/11/23 1131   03/08/23 0800  dolutegravir (TIVICAY) tablet 50 mg  Status:  Discontinued        50 mg Oral Daily before lunch 03/07/23 1521 03/10/23 1030   03/07/23 1700  rilpivirine (EDURANT) tablet 25 mg  Status:  Discontinued        25 mg Oral Daily with breakfast 03/07/23 1521 03/10/23 1030   03/07/23 1245  vancomycin (VANCOREADY) IVPB 1500 mg/300 mL        1,500 mg 150 mL/hr over 120 Minutes Intravenous  Once 03/07/23 1243 03/07/23 1639   03/07/23 1230  ceFEPIme (MAXIPIME) 2 g in sodium chloride 0.9 % 100 mL IVPB         2 g 200 mL/hr over 30 Minutes Intravenous  Once 03/07/23 1216 03/07/23 1435   03/07/23 1230  metroNIDAZOLE (FLAGYL) IVPB 500 mg        500 mg 100 mL/hr over 60 Minutes Intravenous  Once 03/07/23 1216 03/07/23 1439        MEDICATIONS: Scheduled Meds:  [MAR Hold] aspirin  81 mg Oral Daily   [MAR Hold] atorvastatin  40 mg Oral Daily   [MAR Hold] bictegravir-emtricitabine-tenofovir AF  1 tablet Oral Q lunch   [MAR Hold] diltiazem  180 mg Oral Daily   [MAR Hold] DULoxetine  30 mg Oral Daily   [MAR Hold] fluticasone furoate-vilanterol  1 puff Inhalation Daily   [MAR Hold] gabapentin  100 mg Oral BID   [MAR Hold] heparin  5,000 Units Subcutaneous Q8H   [MAR Hold] hydrALAZINE  75 mg Oral Q8H   [MAR Hold] insulin aspart  0-6 Units Subcutaneous TID WC   [MAR Hold] linaclotide  290 mcg Oral QAC  breakfast   [MAR Hold] multivitamin  1 tablet Oral QHS   [MAR Hold] pantoprazole  40 mg Oral Q1200   [MAR Hold] polyethylene glycol  17 g Oral BID   [MAR Hold] senna-docusate  2 tablet Oral QHS   [MAR Hold] sodium bicarbonate  1,300 mg Oral BID   [MAR Hold] sodium chloride flush  3 mL Intravenous Q12H   Continuous Infusions:  sodium chloride 20 mL/hr at 03/20/23 0922   PRN Meds:.[MAR Hold] acetaminophen **OR** [MAR Hold] acetaminophen, [MAR Hold] albuterol, [MAR Hold] calcium carbonate (dosed in mg elemental calcium), [MAR Hold] camphor-menthol **AND** [MAR Hold] hydrOXYzine, [MAR Hold] hydrALAZINE, [MAR Hold] ondansetron **OR** [MAR Hold] ondansetron (ZOFRAN) IV, [MAR Hold] sorbitol, [MAR Hold] zolpidem   I have personally reviewed following labs and imaging studies  LABORATORY DATA: CBC: Recent Labs  Lab 03/19/23 0942 03/20/23 0549  WBC 4.8 3.6*  NEUTROABS 3.3 2.4  HGB 9.4* 9.6*  HCT 30.1* 29.8*  MCV 91.2 91.4  PLT 159 149*     Basic Metabolic Panel: Recent Labs  Lab 03/20/23 0549  NA 139  K 3.8  CL 104  CO2 23  GLUCOSE 112*  BUN 39*  CREATININE 4.34*  CALCIUM 8.3*      GFR: Estimated Creatinine Clearance: 14.9 mL/min (A) (by C-G formula based on SCr of 4.34 mg/dL (H)).  Liver Function Tests: Recent Labs  Lab 03/20/23 0549  AST 24  ALT 27  ALKPHOS 73  BILITOT 0.8  PROT 5.9*  ALBUMIN 2.7*    No results for input(s): "LIPASE", "AMYLASE" in the last 168 hours.  No results for input(s): "AMMONIA" in the last 168 hours.  Coagulation Profile: No results for input(s): "INR", "PROTIME" in the last 168 hours.  Cardiac Enzymes: No results for input(s): "CKTOTAL", "CKMB", "CKMBINDEX", "TROPONINI" in the last 168 hours.   BNP (last 3 results) No results for input(s): "PROBNP" in the last 8760 hours.  Lipid Profile: No results for input(s): "CHOL", "HDL", "LDLCALC", "TRIG", "CHOLHDL", "LDLDIRECT" in the last 72 hours.  Thyroid Function Tests: No results for input(s): "TSH", "T4TOTAL", "FREET4", "T3FREE", "THYROIDAB" in the last 72 hours.  Anemia Panel: No results for input(s): "VITAMINB12", "FOLATE", "FERRITIN", "TIBC", "IRON", "RETICCTPCT" in the last 72 hours.  Urine analysis:    Component Value Date/Time   COLORURINE YELLOW 03/07/2023 1250   APPEARANCEUR CLEAR 03/07/2023 1250   LABSPEC 1.013 03/07/2023 1250   PHURINE 5.0 03/07/2023 1250   GLUCOSEU 50 (A) 03/07/2023 1250   HGBUR MODERATE (A) 03/07/2023 1250   BILIRUBINUR NEGATIVE 03/07/2023 1250   KETONESUR 5 (A) 03/07/2023 1250   PROTEINUR >=300 (A) 03/07/2023 1250   UROBILINOGEN 0.2 08/13/2015 1030   NITRITE NEGATIVE 03/07/2023 1250   LEUKOCYTESUR NEGATIVE 03/07/2023 1250    Sepsis Labs: Lactic Acid, Venous    Component Value Date/Time   LATICACIDVEN 1.2 03/07/2023 1344    MICROBIOLOGY: No results found for this or any previous visit (from the past 240 hour(s)).   RADIOLOGY STUDIES/RESULTS: No results found.   LOS: 12 days   Jeoffrey Massed, MD  [facility]    To contact the attending provider between 7A-7P or the covering provider during after hours  7P-7A, please log into the web site [email or web address] and access using universal [place] password for that web site. If you do not have the password, please call the hospital operator.  03/20/2023, 10:25 AM

## 2023-03-20 NOTE — Anesthesia Preprocedure Evaluation (Addendum)
Anesthesia Evaluation  Patient identified by MRN, date of birth, ID band Patient awake    Reviewed: Allergy & Precautions, NPO status , Patient's Chart, lab work & pertinent test results  Airway Mallampati: II  TM Distance: >3 FB Neck ROM: Full    Dental no notable dental hx.    Pulmonary asthma , COPD, PE   Pulmonary exam normal        Cardiovascular hypertension, Pt. on medications + CAD and +CHF  + dysrhythmias Atrial Fibrillation  Rhythm:Regular Rate:Normal     Neuro/Psych  Headaches   Depression       GI/Hepatic ,GERD  ,,  Endo/Other  diabetes    Renal/GU CRFRenal disease  negative genitourinary   Musculoskeletal  (+) Arthritis ,  Fibromyalgia -  Abdominal Normal abdominal exam  (+)   Peds  Hematology  (+) Blood dyscrasia, anemia , HIV  Anesthesia Other Findings   Reproductive/Obstetrics                             Anesthesia Physical Anesthesia Plan  ASA: 3  Anesthesia Plan: MAC   Post-op Pain Management:    Induction: Intravenous  PONV Risk Score and Plan: 1 and Propofol infusion and Treatment may vary due to age or medical condition  Airway Management Planned: Simple Face Mask and Nasal Cannula  Additional Equipment: None  Intra-op Plan:   Post-operative Plan:   Informed Consent: I have reviewed the patients History and Physical, chart, labs and discussed the procedure including the risks, benefits and alternatives for the proposed anesthesia with the patient or authorized representative who has indicated his/her understanding and acceptance.     Dental advisory given  Plan Discussed with: CRNA  Anesthesia Plan Comments:        Anesthesia Quick Evaluation

## 2023-03-20 NOTE — Op Note (Signed)
[facility] Patient Name: Michael [name] Procedure Date : 03/20/2023 MRN: [id number] Attending MD: Doristine Locks , MD, [id number] Date of Birth: [birth year] CSN: [id number] Age: 72 Admit Type: Inpatient Procedure:                Colonoscopy Indications:              Generalized abdominal pain, Iron deficiency anemia,                            Change in bowel habits, Constipation Providers:                Doristine Locks, MD, Fransisca Connors, Priscella Mann, Technician Referring MD:              Medicines:                Monitored Anesthesia Care Complications:            No immediate complications. Estimated Blood Loss:     Estimated blood loss was minimal. Procedure:                Pre-Anesthesia Assessment:                           - Prior to the procedure, a History and Physical                            was performed, and patient medications and                            allergies were reviewed. The patient's tolerance of                            previous anesthesia was also reviewed. The risks                            and benefits of the procedure and the sedation                            options and risks were discussed with the patient.                            All questions were answered, and informed consent                            was obtained. Prior Anticoagulants: The patient has                            taken no anticoagulant or antiplatelet agents. ASA                            Grade Assessment: III - A patient with severe  systemic disease. After reviewing the risks and                            benefits, the patient was deemed in satisfactory                            condition to undergo the procedure.                           After obtaining informed consent, the colonoscope                            was passed under direct vision. Throughout the                             procedure, the patient's blood pressure, pulse, and                            oxygen saturations were monitored continuously. The                            [id number]-HQ190L ([id number] Olympus coloscope was                            introduced through the anus and advanced to the the                            cecum, identified by appendiceal orifice and                            ileocecal valve. The colonoscopy was performed                            without difficulty. The patient tolerated the                            procedure well. The quality of the bowel                            preparation was adequate. The ileocecal valve,                            appendiceal orifice, and rectum were photographed. Scope In: 9:48:04 AM Scope Out: 10:06:25 AM Scope Withdrawal Time: 0 hours 13 minutes 55 seconds  Total Procedure Duration: 0 hours 18 minutes 21 seconds  Findings:      The perianal and digital rectal examinations were normal.      Two sessile polyps were found in the descending colon. The polyps were 4       to 6 mm in size. These polyps were removed with a cold snare. Resection       and retrieval were complete. Estimated blood loss was minimal.      The exam was otherwise normal throughout the remainder of the colon.      Non-bleeding internal hemorrhoids were found during retroflexion. The  hemorrhoids were small. Impression:               - Two 4 to 6 mm polyps in the descending colon,                            removed with a cold snare. Resected and retrieved.                           - Non-bleeding internal hemorrhoids. Recommendation:           - Return patient to hospital ward for ongoing care.                           - Advance diet as tolerated.                           - Continue present medications.                           - Await pathology results.                           - Repeat colonoscopy for surveillance based on                            pathology  results.                           - Inpatient GI service will sign off at this time.                            Please do not hesitate to contact us with                            additional questions or concerns. Procedure Code(s):        --- Professional ---                           [phone], Colonoscopy, flexible; with removal of                            tumor(s), polyp(s), or other lesion(s) by snare                            technique Diagnosis Code(s):        --- Professional ---                           D12.4, Benign neoplasm of descending colon                           K64.8, Other hemorrhoids                           R10.84, Generalized abdominal pain  D50.9, Iron deficiency anemia, unspecified                           R19.4, Change in bowel habit                           K59.00, Constipation, unspecified CPT copyright 2022 American Medical Association. All rights reserved. The codes documented in this report are preliminary and upon coder review may  be revised to meet current compliance requirements. Doristine Locks, MD 03/20/2023 10:23:11 AM Number of Addenda: 0

## 2023-03-20 NOTE — Progress Notes (Signed)
Patient to [facility] lab for endoscopy and colonoscopy.

## 2023-03-20 NOTE — Anesthesia Postprocedure Evaluation (Signed)
Anesthesia Post Note  Patient: Michael [name]  Procedure(s) Performed: ESOPHAGOGASTRODUODENOSCOPY (EGD) WITH PROPOFOL COLONOSCOPY WITH PROPOFOL BIOPSY POLYPECTOMY     Patient location during evaluation: PACU Anesthesia Type: MAC Level of consciousness: awake and alert Pain management: pain level controlled Vital Signs Assessment: post-procedure vital signs reviewed and stable Respiratory status: spontaneous breathing, nonlabored ventilation, respiratory function stable and patient connected to nasal cannula oxygen Cardiovascular status: stable and blood pressure returned to baseline Postop Assessment: no apparent nausea or vomiting Anesthetic complications: no   No notable events documented.  Last Vitals:  Vitals:   03/20/23 1030 03/20/23 1206  BP: (!) 155/90   Pulse:    Resp:  17  Temp:  36.7 C  SpO2:      Last Pain:  Vitals:   03/20/23 1206  TempSrc: Oral  PainSc:                  Nelle Don Viktor Philipp

## 2023-03-20 NOTE — Interval H&P Note (Signed)
History and Physical Interval Note:  03/20/2023 9:00 AM  Michael [name]  has presented today for surgery, with the diagnosis of IDA, change in bowel habits, abdominal pain.  The various methods of treatment have been discussed with the patient and family. After consideration of risks, benefits and other options for treatment, the patient has consented to  Procedure(s): ESOPHAGOGASTRODUODENOSCOPY (EGD) WITH PROPOFOL (N/A) COLONOSCOPY WITH PROPOFOL (N/A) as a surgical intervention.  The patient's history has been reviewed, patient examined, no change in status, stable for surgery.  I have reviewed the patient's chart and labs.  Questions were answered to the patient's satisfaction.     Verlin Dike Janiyha Montufar

## 2023-03-20 NOTE — Progress Notes (Signed)
Patient back from endo/colonoscopy. Patient a/o x4. Patient denies complaints.

## 2023-03-20 NOTE — Progress Notes (Signed)
Mobility Specialist Progress Note:   03/20/23 1320  Mobility  Activity Ambulated with assistance in hallway  Level of Assistance Contact guard assist, steadying assist  Assistive Device Four wheel walker  Distance Ambulated (ft) 160 ft  Activity Response Tolerated well  Mobility Referral Yes  $Mobility charge 1 Mobility  Mobility Specialist Start Time (ACUTE ONLY) 1320  Mobility Specialist Stop Time (ACUTE ONLY) 1350  Mobility Specialist Time Calculation (min) (ACUTE ONLY) 30 min   Pt eager for mobility session. Required minG assist for ambulation. X1 seated rest break taken d/t fatigue. Pt left sitting in chair with all needs met, alarm on.   Addison Lank Mobility Specialist Please contact via SecureChat or  Rehab office at [phone]

## 2023-03-20 NOTE — Op Note (Signed)
[facility] Patient Name: Michael [name] Procedure Date : 03/20/2023 MRN: [id number] Attending MD: Doristine Locks , MD, [id number] Date of Birth: Feb 11, 1951 CSN: [id number] Age: 72 Admit Type: Inpatient Procedure:                Upper GI endoscopy Indications:              Generalized abdominal pain, Iron deficiency anemia,                            Heartburn, Nausea Providers:                Doristine Locks, MD, Fransisca Connors, Priscella Mann, Technician Referring MD:              Medicines:                Monitored Anesthesia Care Complications:            No immediate complications. Estimated Blood Loss:     Estimated blood loss was minimal. Procedure:                Pre-Anesthesia Assessment:                           - Prior to the procedure, a History and Physical                            was performed, and patient medications and                            allergies were reviewed. The patient's tolerance of                            previous anesthesia was also reviewed. The risks                            and benefits of the procedure and the sedation                            options and risks were discussed with the patient.                            All questions were answered, and informed consent                            was obtained. Prior Anticoagulants: The patient has                            taken no anticoagulant or antiplatelet agents. ASA                            Grade Assessment: III - A patient with severe  systemic disease. After reviewing the risks and                            benefits, the patient was deemed in satisfactory                            condition to undergo the procedure.                           After obtaining informed consent, the endoscope was                            passed under direct vision. Throughout the                            procedure, the  patient's blood pressure, pulse, and                            oxygen saturations were monitored continuously. The                            [id number]-H190 ([id number]) Olympus endoscope was introduced                            through the mouth, and advanced to the second part                            of duodenum. The upper GI endoscopy was                            accomplished without difficulty. The patient                            tolerated the procedure well. Scope In: Scope Out: Findings:      The examined esophagus was normal.      Segmental mild inflammation characterized by congestion (edema) and       erythema was found in the gastric fundus, in the gastric body and in the       gastric antrum. Biopsies were taken with a cold forceps for Helicobacter       pylori testing. Estimated blood loss was minimal.      Localized mildly erythematous mucosa without active bleeding was found       in the duodenal bulb. Biopsies were taken with a cold forceps for       histology. Estimated blood loss was minimal.      The second portion of the duodenum was normal. Impression:               - Normal esophagus.                           - Gastritis. Biopsied.                           - Erythematous duodenopathy. Biopsied.                           -  Normal second portion of the duodenum. Recommendation:           - Return patient to hospital ward for ongoing care.                           - Advance diet as tolerated.                           - Await pathology results.                           - Use Protonix (pantoprazole) 40 mg PO BID for 6                            weeks, then reduce to 40 mg daily.                           - Colonoscopy today. Procedure Code(s):        --- Professional ---                           [phone], Esophagogastroduodenoscopy, flexible,                            transoral; with biopsy, single or multiple Diagnosis Code(s):        --- Professional ---                            K29.70, Gastritis, unspecified, without bleeding                           K31.89, Other diseases of stomach and duodenum                           R10.84, Generalized abdominal pain                           D50.9, Iron deficiency anemia, unspecified                           R12, Heartburn                           R11.0, Nausea CPT copyright 2022 [employer] Association. All rights reserved. The codes documented in this report are preliminary and upon coder review may  be revised to meet current compliance requirements. Doristine Locks, MD 03/20/2023 10:20:03 AM Number of Addenda: 0

## 2023-03-20 NOTE — Transfer of Care (Signed)
Immediate Anesthesia Transfer of Care Note  Patient: Michael [name]  Procedure(s) Performed: ESOPHAGOGASTRODUODENOSCOPY (EGD) WITH PROPOFOL COLONOSCOPY WITH PROPOFOL BIOPSY POLYPECTOMY  Patient Location: PACU  Anesthesia Type:MAC  Level of Consciousness: drowsy and patient cooperative  Airway & Oxygen Therapy: Patient Spontanous Breathing and Patient connected to nasal cannula oxygen  Post-op Assessment: Report given to RN and Post -op Vital signs reviewed and stable  Post vital signs: Reviewed and stable  Last Vitals:  Vitals Value Taken Time  BP 137/71 03/20/23 1015  Temp 36.4 C 03/20/23 1011  Pulse 72 03/20/23 1016  Resp 15 03/20/23 1016  SpO2 100 % 03/20/23 1016  Vitals shown include unvalidated device data.  Last Pain:  Vitals:   03/20/23 0855  TempSrc: Oral  PainSc: 7       Patients Stated Pain Goal: 6 (03/11/23 1010)  Complications: No notable events documented.

## 2023-03-21 DIAGNOSIS — F1910 Other psychoactive substance abuse, uncomplicated: Secondary | ICD-10-CM | POA: Diagnosis not present

## 2023-03-21 DIAGNOSIS — I480 Paroxysmal atrial fibrillation: Secondary | ICD-10-CM | POA: Diagnosis not present

## 2023-03-21 DIAGNOSIS — B20 Human immunodeficiency virus [HIV] disease: Secondary | ICD-10-CM | POA: Diagnosis not present

## 2023-03-21 DIAGNOSIS — G9341 Metabolic encephalopathy: Secondary | ICD-10-CM | POA: Diagnosis not present

## 2023-03-21 LAB — GLUCOSE, CAPILLARY
Glucose-Capillary: 131 mg/dL — ABNORMAL HIGH (ref 70–99)
Glucose-Capillary: 127 mg/dL — ABNORMAL HIGH (ref 70–99)
Glucose-Capillary: 151 mg/dL — ABNORMAL HIGH (ref 70–99)
Glucose-Capillary: 140 mg/dL — ABNORMAL HIGH (ref 70–99)

## 2023-03-21 LAB — SURGICAL PATHOLOGY

## 2023-03-21 NOTE — Progress Notes (Signed)
Mobility Specialist Progress Note   03/21/23 1400  Mobility  Activity Ambulated with assistance in hallway  Level of Assistance Contact guard assist, steadying assist  Assistive Device Four wheel walker  Distance Ambulated (ft) 210 ft  Activity Response Tolerated well  Mobility Referral Yes  $Mobility charge 1 Mobility  Mobility Specialist Start Time (ACUTE ONLY) 1350  Mobility Specialist Stop Time (ACUTE ONLY) 1410  Mobility Specialist Time Calculation (min) (ACUTE ONLY) 20 min   Received pt in chair eager for another walk. Pt ambulating in hallway w/ steady gait and having no incidents. Returned to chair w/ all needs met and chair alarm on.   Frederico Hamman Mobility Specialist Please contact via SecureChat or  Rehab office at [phone]

## 2023-03-21 NOTE — TOC Progression Note (Signed)
Transition of Care [facility]) - Progression Note    Patient Details  Name: Michael [name] MRN: [id number] Date of Birth: 1951-07-25  [facility]) CM/SW Contact  Gordy Clement, RN Phone Number: 03/21/2023, 3:49 PM  Clinical Narrative:    CM met with patient in room. Patient states that his Brother is going to let him live with him.  CM spoke with the Lovie Chol [phone] ) and confirmed that Patient can live with him and that he will transport home at DC.  He will need a phone call letting him know when to come pick patient up.   Address Patient will dc to :[address] [place], [place]    Home Health [facility])  will need to call the Brother's number to schedule. [phone]) They have been notified  Patient has had a rollator delivered to the room from [employer] . PT states that he is ambulating much better now thus recs are changing from SNF to [facility] PT.   CM has requested a PCP and Endocrinologist for patient.  Patient states that his PCP is in [place] and he really wants one in [place].  When asked who was managing his DM , patient stated that he just let's them manage it every time he comes to the hospital-He was very agreeable to an endocrinologist as well.     AVS has been updated.   TOC will continue to follow patient for any additional discharge needs           Expected Discharge Plan: Skilled Nursing Facility Barriers to Discharge: Continued Medical Work up, Homeless with medical needs, Active Substance Use - Placement, SNF Pending bed offer, English as a second language teacher  Expected Discharge Plan and Services                                               Social Determinants of Health (SDOH) Interventions SDOH Screenings   Food Insecurity: Food Insecurity Present (03/07/2023)  Housing: Patient Unable To Answer (03/07/2023)  Recent Concern: Housing - Medium Risk (02/14/2023)  Transportation Needs: Unmet Transportation Needs  (03/07/2023)  Utilities: Not At Risk (03/07/2023)  Alcohol Screen: Low Risk  (11/27/2022)  Depression (PHQ2-9): High Risk (11/12/2021)  Financial Resource Strain: Medium Risk (09/16/2018)  Physical Activity: Unknown (04/22/2019)  Social Connections: Socially Isolated (09/16/2018)  Stress: Stress Concern Present (09/16/2018)  Tobacco Use: Low Risk  (03/20/2023)    Readmission Risk Interventions    03/14/2023    3:46 PM 02/17/2023    1:01 PM 07/05/2022   10:08 AM  Readmission Risk Prevention Plan  Transportation Screening Complete Complete Complete  Medication Review Oceanographer) Complete Complete Complete  PCP or Specialist appointment within 3-5 days of discharge Complete Complete Complete  HRI or Home Care Consult Complete Complete Complete  SW Recovery Care/Counseling Consult Complete Complete Complete  Palliative Care Screening Not Applicable Not Applicable Not Applicable  Skilled Nursing Facility Complete Not Applicable Not Applicable

## 2023-03-21 NOTE — Progress Notes (Signed)
PROGRESS NOTE        PATIENT DETAILS Name: Michael [name] Age: 72 y.o. Sex: male Date of Birth: 04-29-1951 Admit Date: 03/07/2023 Admitting Physician Jonah Blue, MD ZOX:[id number], [name], MD  Brief Summary: Patient is a 72 y.o.  male with history of HFpEF, CAD s/p CABG, HTN, DM-2, PAF, COPD, mood disorder, HIV, cocaine use, homelessness-presented with confusion-in the setting of crack cocaine use and progression of CKD.  Patient was subsequently admitted to the hospitalist service.  See below for further details.  Significant events: 5/31>> admit to [facility].  Significant studies: 5/31>> CT head: no acute abnormality. 5/31>>CT C-spine: No fracture 5/31>> CT chest: Mild bronchial thickening/tree-in-bud opacities in the right lower lobe concerning 5/31 >> CT abdomen/pelvis: No evidence for acute abdominal process.  For infectious/inflammatory process 6/5>> CT abdomen/pelvis: No acute abnormality-moderate stool burden  Significant microbiology data: 5/31>> blood culture: No growth  Procedures: 6/13>> colonoscopy: 2 polyps descending colon, nonbleeding internal hemorrhoids 6/13>> EGD: Gastritis/erythematous duodenopathy  Consults: Infectious disease Nephrology GI  Subjective: Some abdominal pain continues.  Last BM was yesterday.  Objective: Vitals: Blood pressure (!) 142/74, pulse 60, temperature 98.4 F (36.9 C), temperature source Oral, resp. rate 17, height 5\' 8"  (1.727 m), weight 72.1 kg, SpO2 98 %.   Exam: Abdominal: Soft-mild-diffuse tenderness-no peritoneal signs.   Pertinent Labs/Radiology:    Latest Ref Rng & Units 03/20/2023    5:49 AM 03/19/2023    9:42 AM 03/13/2023    6:03 AM  CBC  WBC 4.0 - 10.5 K/uL 3.6  4.8  5.1   Hemoglobin 13.0 - 17.0 g/dL 9.6  9.4  9.7   Hematocrit 39.0 - 52.0 % 29.8  30.1  29.9   Platelets 150 - 400 K/uL 149  159  PLATELET CLUMPS NOTED ON SMEAR, UNABLE TO ESTIMATE     Lab Results  Component Value Date    NA 139 03/20/2023   K 3.8 03/20/2023   CL 104 03/20/2023   CO2 23 03/20/2023     Assessment/Plan: Acute toxic encephalopathy Secondary to crack cocaine use Resolved-completely awake/alert.  CKD 4 with progression to CKD 5 Stable with no indications for acute HD.  Per nephrology-poor HD candidate due to noncompliance homelessness. Follow renal function periodically.  Normocytic anemia Secondary to underlying CKD Follow CBC periodically  Abdominal pain Constipation-change in bowel habit CT Abdomen/pelvis x 2 negative for acute abnormalities EGD/colonoscopy as above-follow biopsy results Abdominal pain is chronic going on for several months-workup as above-doubt further workup is required.  Suspect worsened recently due to constipation. Continue MiraLAX/senna/Linzess Dulcolax for moderate constipation Soapsuds for severe constipation  Acute on chronic HFpEF Euvolemic  CAD s/p CABG No anginal symptoms Continue aspirin/Lipitor  PAF Maintaining sinus rhythm Diltiazem Not a candidate for anticoagulation due to homelessness/noncompliance  HTN BP stable with hydralazine/Cardizem  DM-2 (A1c 6.8 on 12/25) CBG stable with SSI  Recent Labs    03/20/23 1555 03/20/23 2123 03/21/23 0852  GLUCAP 204* 144* 131*      HIV Antiretrovirals per ID.  COPD Not in exacerbation Continue bronchodilators  Mood disorder Cymbalta  Debility/deconditioning PT/OT eval recommending SNF-Per social work-will be difficult to place given history of drug use Plan is to continue PT/OT-once improves-he will be discharged back to his prior living situation.   He tells me he will not go back to shelter-I have encouraged him to  talk to his sister who lives in [place] to see if he can live with her.  Homelessness Lives in a [facility] shelter   Nutrition Status: Nutrition Problem: Moderate Malnutrition Etiology: chronic illness Signs/Symptoms: moderate muscle depletion, mild fat  depletion Interventions: Magic cup, MVI    BMI: Estimated body mass index is 24.17 kg/m as calculated from the following:   Height as of this encounter: 5\' 8"  (1.727 m).   Weight as of this encounter: 72.1 kg.   Code status:   Code Status: Full Code   DVT Prophylaxis: heparin injection 5,000 Units Start: 03/07/23 1400   Family Communication: None at bedside  Disposition Plan: Status is: Inpatient Remains inpatient appropriate because: Severity of illness   Planned Discharge Destination:Skilled nursing facility   Diet: Diet Order             Diet heart healthy/carb modified Fluid consistency: Thin  Diet effective now                     Antimicrobial agents: Anti-infectives (From admission, onward)    Start     Dose/Rate Route Frequency Ordered Stop   03/14/23 0000  bictegravir-emtricitabine-tenofovir AF (BIKTARVY) 50-200-25 MG TABS tablet        1 tablet Oral Daily 03/14/23 1145     03/12/23 1200  bictegravir-emtricitabine-tenofovir AF (BIKTARVY) 50-200-25 MG per tablet 1 tablet        1 tablet Oral Daily with lunch 03/12/23 1023     03/11/23 1230  dolutegravir-rilpivirine (JULUCA) 50-25 MG per tablet 1 tablet  Status:  Discontinued        1 tablet Oral Daily with lunch 03/11/23 1131 03/12/23 1023   03/10/23 1130  bictegravir-emtricitabine-tenofovir AF (BIKTARVY) 50-200-25 MG per tablet 1 tablet  Status:  Discontinued        1 tablet Oral Daily with breakfast 03/10/23 1030 03/11/23 1131   03/08/23 0800  dolutegravir (TIVICAY) tablet 50 mg  Status:  Discontinued        50 mg Oral Daily before lunch 03/07/23 1521 03/10/23 1030   03/07/23 1700  rilpivirine (EDURANT) tablet 25 mg  Status:  Discontinued        25 mg Oral Daily with breakfast 03/07/23 1521 03/10/23 1030   03/07/23 1245  vancomycin (VANCOREADY) IVPB 1500 mg/300 mL        1,500 mg 150 mL/hr over 120 Minutes Intravenous  Once 03/07/23 1243 03/07/23 1639   03/07/23 1230  ceFEPIme (MAXIPIME) 2 g in  sodium chloride 0.9 % 100 mL IVPB        2 g 200 mL/hr over 30 Minutes Intravenous  Once 03/07/23 1216 03/07/23 1435   03/07/23 1230  metroNIDAZOLE (FLAGYL) IVPB 500 mg        500 mg 100 mL/hr over 60 Minutes Intravenous  Once 03/07/23 1216 03/07/23 1439        MEDICATIONS: Scheduled Meds:  aspirin  81 mg Oral Daily   atorvastatin  40 mg Oral Daily   bictegravir-emtricitabine-tenofovir AF  1 tablet Oral Q lunch   diltiazem  180 mg Oral Daily   DULoxetine  30 mg Oral Daily   fluticasone furoate-vilanterol  1 puff Inhalation Daily   gabapentin  100 mg Oral BID   heparin  5,000 Units Subcutaneous Q8H   hydrALAZINE  75 mg Oral Q8H   insulin aspart  0-6 Units Subcutaneous TID WC   linaclotide  290 mcg Oral QAC breakfast   multivitamin  1 tablet Oral QHS  pantoprazole  40 mg Oral Q1200   polyethylene glycol  17 g Oral BID   senna-docusate  2 tablet Oral BID   sodium bicarbonate  1,300 mg Oral BID   sodium chloride flush  3 mL Intravenous Q12H   Continuous Infusions:  PRN Meds:.acetaminophen **OR** acetaminophen, albuterol, bisacodyl, calcium carbonate (dosed in mg elemental calcium), camphor-menthol **AND** hydrOXYzine, hydrALAZINE, ondansetron **OR** ondansetron (ZOFRAN) IV, sorbitol, zolpidem   I have personally reviewed following labs and imaging studies  LABORATORY DATA: CBC: Recent Labs  Lab 03/19/23 0942 03/20/23 0549  WBC 4.8 3.6*  NEUTROABS 3.3 2.4  HGB 9.4* 9.6*  HCT 30.1* 29.8*  MCV 91.2 91.4  PLT 159 149*     Basic Metabolic Panel: Recent Labs  Lab 03/20/23 0549  NA 139  K 3.8  CL 104  CO2 23  GLUCOSE 112*  BUN 39*  CREATININE 4.34*  CALCIUM 8.3*     GFR: Estimated Creatinine Clearance: 14.9 mL/min (A) (by C-G formula based on SCr of 4.34 mg/dL (H)).  Liver Function Tests: Recent Labs  Lab 03/20/23 0549  AST 24  ALT 27  ALKPHOS 73  BILITOT 0.8  PROT 5.9*  ALBUMIN 2.7*    No results for input(s): "LIPASE", "AMYLASE" in the last  168 hours.  No results for input(s): "AMMONIA" in the last 168 hours.  Coagulation Profile: No results for input(s): "INR", "PROTIME" in the last 168 hours.  Cardiac Enzymes: No results for input(s): "CKTOTAL", "CKMB", "CKMBINDEX", "TROPONINI" in the last 168 hours.   BNP (last 3 results) No results for input(s): "PROBNP" in the last 8760 hours.  Lipid Profile: No results for input(s): "CHOL", "HDL", "LDLCALC", "TRIG", "CHOLHDL", "LDLDIRECT" in the last 72 hours.  Thyroid Function Tests: No results for input(s): "TSH", "T4TOTAL", "FREET4", "T3FREE", "THYROIDAB" in the last 72 hours.  Anemia Panel: No results for input(s): "VITAMINB12", "FOLATE", "FERRITIN", "TIBC", "IRON", "RETICCTPCT" in the last 72 hours.  Urine analysis:    Component Value Date/Time   COLORURINE YELLOW 03/07/2023 1250   APPEARANCEUR CLEAR 03/07/2023 1250   LABSPEC 1.013 03/07/2023 1250   PHURINE 5.0 03/07/2023 1250   GLUCOSEU 50 (A) 03/07/2023 1250   HGBUR MODERATE (A) 03/07/2023 1250   BILIRUBINUR NEGATIVE 03/07/2023 1250   KETONESUR 5 (A) 03/07/2023 1250   PROTEINUR >=300 (A) 03/07/2023 1250   UROBILINOGEN 0.2 08/13/2015 1030   NITRITE NEGATIVE 03/07/2023 1250   LEUKOCYTESUR NEGATIVE 03/07/2023 1250    Sepsis Labs: Lactic Acid, Venous    Component Value Date/Time   LATICACIDVEN 1.2 03/07/2023 1344    MICROBIOLOGY: No results found for this or any previous visit (from the past 240 hour(s)).   RADIOLOGY STUDIES/RESULTS: No results found.   LOS: 13 days   Jeoffrey Massed, MD  [facility]    To contact the attending provider between 7A-7P or the covering provider during after hours 7P-7A, please log into the web site [email or web address] and access using universal [place] password for that web site. If you do not have the password, please call the hospital operator.  03/21/2023, 9:59 AM

## 2023-03-21 NOTE — Progress Notes (Signed)
Mobility Specialist Progress Note:   03/21/23 1130  Mobility  Activity Ambulated with assistance in hallway  Level of Assistance Contact guard assist, steadying assist  Assistive Device Four wheel walker  Distance Ambulated (ft) 200 ft  Activity Response Tolerated well  Mobility Referral Yes  $Mobility charge 1 Mobility  Mobility Specialist Start Time (ACUTE ONLY) 1130  Mobility Specialist Stop Time (ACUTE ONLY) 1151  Mobility Specialist Time Calculation (min) (ACUTE ONLY) 21 min   Pt eager for mobility session. Required no physical assistance throughout. Pt back in chair with all needs met. Alarm on.  Addison Lank Mobility Specialist Please contact via SecureChat or  Rehab office at [phone]

## 2023-03-21 NOTE — Progress Notes (Signed)
Physical Therapy Treatment Patient Details Name: Michael [name] MRN: [id number] DOB: 03-16-1951 Today's Date: 03/21/2023   History of Present Illness 72 year old male with history of HIV, homelessness, cocaine abuse, PAF, CKD stage IV, CAD status post CABG, IDDM who returns to the hospital on 03/07/23 after a recent admission on 02/14/23  with community-acquired pneumonia as well as suicidal ideation.  This admission pt presents with AMS, chest pain and CKD. He is well-known to our system with frequent ED visits, this being his 12th visit in the past month.    PT Comments    Pt tolerated today's session well, continuing to make great progress with therapy. Pt's rollator was delivered to his room, pt demonstrating proper brake management throughout session. Pt able to perform transfers and ambulate with supervision for safety, no LOB noted. Pt able to properly perform a seated rest break in his rollator in the hallway, properly parking his rollator against the wall and managing his brakes. Pt continues to demonstrate balance deficits, performing toe taps onto a box with BLE, completing with supervision with BUE support, but requiring minA without BUE support, reliant on his arms for balance. Pt reports he is planning to discharge to his brother's home, reports he will have full time care, and no steps to manage at his brother's. Discharge recommendations updated to HHPT, pt with rollator already in possession. Acute PT will continue to follow up with pt as appropriate during admission to maximize independence prior to discharge.     Recommendations for follow up therapy are one component of a multi-disciplinary discharge planning process, led by the attending physician.  Recommendations may be updated based on patient status, additional functional criteria and insurance authorization.  Follow Up Recommendations  Can patient physically be transported by private vehicle: Yes    Assistance  Recommended at Discharge Frequent or constant Supervision/Assistance  Patient can return home with the following A little help with bathing/dressing/bathroom;Assistance with cooking/housework;Direct supervision/assist for medications management;Assist for transportation;Help with stairs or ramp for entrance;Direct supervision/assist for financial management;A little help with walking and/or transfers   Equipment Recommendations  Rollator (4 wheels)    Recommendations for Other Services       Precautions / Restrictions Precautions Precautions: Fall Precaution Comments: 3 falls in past 6 months Restrictions Weight Bearing Restrictions: No     Mobility  Bed Mobility               General bed mobility comments: pt up in chair on arrival    Transfers Overall transfer level: Needs assistance Equipment used: Rollator (4 wheels) Transfers: Sit to/from Stand Sit to Stand: Supervision           General transfer comment: pt with good recall of brake management on rollator, supervision provided for safety, pt with proper hand placement    Ambulation/Gait Ambulation/Gait assistance: Supervision Gait Distance (Feet): 100 Feet Assistive device: Rollator (4 wheels) Gait Pattern/deviations: Decreased stride length, Narrow base of support, Decreased dorsiflexion - right, Decreased dorsiflexion - left, Drifts right/left, Step-through pattern Gait velocity: decreased     General Gait Details: pt with good management of rollator around obstacle, slow steady gait, intermittent cues for forward gaze. Able to perform seated rest break with rollator when asked, parking it against the wall and good management of brakes demonstrated   Stairs             Wheelchair Mobility    Modified Rankin (Stroke Patients Only)       Balance Overall balance assessment:  History of Falls, Needs assistance Sitting-balance support: Feet supported, No upper extremity supported Sitting  balance-Leahy Scale: Good     Standing balance support: During functional activity, Bilateral upper extremity supported, Reliant on assistive device for balance, No upper extremity supported Standing balance-Leahy Scale: Fair Standing balance comment: reliant on rollator for ambulation but able to stand statically without UE support                            Cognition Arousal/Alertness: Awake/alert Behavior During Therapy: WFL for tasks assessed/performed Overall Cognitive Status: Impaired/Different from baseline Area of Impairment: Safety/judgement, Awareness, Problem solving, Memory                     Memory: Decreased short-term memory   Safety/Judgement: Decreased awareness of deficits Awareness: Emergent Problem Solving: Slow processing, Requires verbal cues, Decreased initiation General Comments: pleasant throughout session, good recall of rollator use and brakes        Exercises Other Exercises Other Exercises: sit<>stand x5 reps from chair Other Exercises: toe taps on box x5 reps each BLE, 2 sets one with BUE support and one without    General Comments General comments (skin integrity, edema, etc.): VSS, no family at bedside but pt mentions they were present earlier      Pertinent Vitals/Pain Pain Assessment Pain Assessment: Faces Faces Pain Scale: No hurt    Home Living                          Prior Function            PT Goals (current goals can now be found in the care plan section) Acute Rehab PT Goals Patient Stated Goal: get stronger PT Goal Formulation: With patient Time For Goal Achievement: 04/04/23 Potential to Achieve Goals: Good Progress towards PT goals: Progressing toward goals    Frequency    Min 4X/week      PT Plan Discharge plan needs to be updated    Co-evaluation              AM-PAC PT "6 Clicks" Mobility   Outcome Measure  Help needed turning from your back to your side while in a  flat bed without using bedrails?: A Little Help needed moving from lying on your back to sitting on the side of a flat bed without using bedrails?: A Little Help needed moving to and from a bed to a chair (including a wheelchair)?: A Little Help needed standing up from a chair using your arms (e.g., wheelchair or bedside chair)?: A Little Help needed to walk in hospital room?: A Little Help needed climbing 3-5 steps with a railing? : Total 6 Click Score: 16    End of Session Equipment Utilized During Treatment: Gait belt Activity Tolerance: Patient tolerated treatment well Patient left: with call bell/phone within reach;in chair;with chair alarm set Nurse Communication: Mobility status PT Visit Diagnosis: Muscle weakness (generalized) (M62.81);History of falling (Z91.81);Difficulty in walking, not elsewhere classified (R26.2)     Time: 1442-1500 PT Time Calculation (min) (ACUTE ONLY): 18 min  Charges:  $Therapeutic Activity: 8-22 mins                     Lindalou Hose, PT DPT Acute Rehabilitation Services Office [phone]    Leonie Man 03/21/2023, 4:38 PM

## 2023-03-22 DIAGNOSIS — G9341 Metabolic encephalopathy: Secondary | ICD-10-CM | POA: Diagnosis not present

## 2023-03-22 LAB — GLUCOSE, CAPILLARY
Glucose-Capillary: 144 mg/dL — ABNORMAL HIGH (ref 70–99)
Glucose-Capillary: 131 mg/dL — ABNORMAL HIGH (ref 70–99)
Glucose-Capillary: 190 mg/dL — ABNORMAL HIGH (ref 70–99)
Glucose-Capillary: 191 mg/dL — ABNORMAL HIGH (ref 70–99)

## 2023-03-22 MED ORDER — LACTULOSE 10 GM/15ML PO SOLN
30.0000 g | Freq: Three times a day (TID) | ORAL | Status: AC
  Administered 2023-03-22: 30 g via ORAL
  Filled 2023-03-22 (×2): qty 60

## 2023-03-22 NOTE — Plan of Care (Signed)
  Problem: Health Behavior/Discharge Planning: Goal: Ability to manage health-related needs will improve Outcome: Progressing   Problem: Clinical Measurements: Goal: Ability to maintain clinical measurements within normal limits will improve Outcome: Progressing Goal: Will remain free from infection Outcome: Progressing Goal: Diagnostic test results will improve Outcome: Progressing Goal: Respiratory complications will improve Outcome: Progressing Goal: Cardiovascular complication will be avoided Outcome: Progressing   Problem: Activity: Goal: Risk for activity intolerance will decrease Outcome: Progressing   Problem: Nutrition: Goal: Adequate nutrition will be maintained Outcome: Progressing   Problem: Coping: Goal: Level of anxiety will decrease Outcome: Progressing   Problem: Elimination: Goal: Will not experience complications related to bowel motility Outcome: Progressing Goal: Will not experience complications related to urinary retention Outcome: Progressing   Problem: Pain Managment: Goal: General experience of comfort will improve Outcome: Progressing   Problem: Safety: Goal: Ability to remain free from injury will improve Outcome: Progressing   Problem: Skin Integrity: Goal: Risk for impaired skin integrity will decrease Outcome: Progressing   Problem: Education: Goal: Ability to describe self-care measures that may prevent or decrease complications (Diabetes Survival Skills Education) will improve Outcome: Progressing Goal: Individualized Educational Video(s) Outcome: Progressing   Problem: Coping: Goal: Ability to adjust to condition or change in health will improve Outcome: Progressing   Problem: Fluid Volume: Goal: Ability to maintain a balanced intake and output will improve Outcome: Progressing   Problem: Health Behavior/Discharge Planning: Goal: Ability to identify and utilize available resources and services will improve Outcome:  Progressing Goal: Ability to manage health-related needs will improve Outcome: Progressing   Problem: Metabolic: Goal: Ability to maintain appropriate glucose levels will improve Outcome: Progressing   Problem: Nutritional: Goal: Maintenance of adequate nutrition will improve Outcome: Progressing Goal: Progress toward achieving an optimal weight will improve Outcome: Progressing   Problem: Skin Integrity: Goal: Risk for impaired skin integrity will decrease Outcome: Progressing   Problem: Tissue Perfusion: Goal: Adequacy of tissue perfusion will improve Outcome: Progressing   

## 2023-03-22 NOTE — Progress Notes (Signed)
PROGRESS NOTE        PATIENT DETAILS Name: Michael [name] Age: 72 y.o. Sex: male Date of Birth: [birth year] Admit Date: 03/07/2023 Admitting Physician Jonah Blue, MD ZOX:WRUEA, [name], MD  Brief Summary: Patient is a 72 y.o.  male with history of HFpEF, CAD s/p CABG, HTN, DM-2, PAF, COPD, mood disorder, HIV, cocaine use, homelessness-presented with confusion-in the setting of crack cocaine use and progression of CKD.  Patient was subsequently admitted to the hospitalist service.  See below for further details.  Significant events: 5/31>> admit to [facility].  Significant studies: 5/31>> CT head: no acute abnormality. 5/31>>CT C-spine: No fracture 5/31>> CT chest: Mild bronchial thickening/tree-in-bud opacities in the right lower lobe concerning 5/31 >> CT abdomen/pelvis: No evidence for acute abdominal process.  For infectious/inflammatory process 6/5>> CT abdomen/pelvis: No acute abnormality-moderate stool burden  Significant microbiology data: 5/31>> blood culture: No growth  Procedures: 6/13>> colonoscopy: 2 polyps descending colon, nonbleeding internal hemorrhoids 6/13>> EGD: Gastritis/erythematous duodenopathy  Consults: Infectious disease Nephrology GI  Subjective:   Patient in bed, appears comfortable, denies any headache, no fever, no chest pain or pressure, no shortness of breath , no abdominal pain. No new focal weakness.   Objective: Vitals: Blood pressure (!) 140/78, pulse 68, temperature 98.3 F (36.8 C), temperature source Oral, resp. rate 16, height 5\' 8"  (1.727 m), weight 70.7 kg, SpO2 98 %.   Exam:  Awake Alert, No new F.N deficits, Normal affect [place].AT,PERRAL Supple Neck, No JVD,   Symmetrical Chest wall movement, Good air movement bilaterally, CTAB RRR,No Gallops, Rubs or new Murmurs,  +ve B.Sounds, Abd Soft, No tenderness,   No Cyanosis, Clubbing or edema   Assessment/Plan:  Acute toxic encephalopathy Secondary to  crack cocaine use Resolved-completely awake/alert.  CKD 4 with progression to CKD 5 Stable with no indications for acute HD.  Per nephrology-poor HD candidate due to noncompliance homelessness. Follow renal function periodically.  Normocytic anemia Secondary to underlying CKD Follow CBC periodically  Abdominal pain Constipation-change in bowel habit CT Abdomen/pelvis x 2 negative for acute abnormalities EGD/colonoscopy as above-follow biopsy results Abdominal pain is chronic going on for several months-workup as above-doubt further workup is required.  Suspect worsened recently due to constipation. Continue MiraLAX/senna/Linzess Dulcolax for moderate constipation Soapsuds for severe constipation  Acute on chronic HFpEF Euvolemic  CAD s/p CABG No anginal symptoms Continue aspirin/Lipitor  PAF Maintaining sinus rhythm Diltiazem Not a candidate for anticoagulation due to homelessness/noncompliance  HTN BP stable with hydralazine/Cardizem  HIV Antiretrovirals per ID.  COPD Not in exacerbation Continue bronchodilators  Mood disorder Cymbalta  DM-2 (A1c 6.8 on 12/25) CBG stable with SSI  Recent Labs    03/21/23 1538 03/21/23 2133 03/22/23 0821  GLUCAP 151* 140* 144*      Debility/deconditioning PT/OT eval recommending SNF-Per social work-will be difficult to place given history of drug use Plan is to continue PT/OT-once improves-he will be discharged back to his prior living situation.   He tells me he will not go back to shelter-I have encouraged him to talk to his sister who lives in [place] to see if he can live with her.  Homelessness Lives in a [facility] shelter   Nutrition Status: Nutrition Problem: Moderate Malnutrition Etiology: chronic illness Signs/Symptoms: moderate muscle depletion, mild fat depletion Interventions: Magic cup, MVI    BMI: Estimated body mass index is 23.7  kg/m as calculated from the following:   Height as of this  encounter: 5\' 8"  (1.727 m).   Weight as of this encounter: 70.7 kg.   Code status:   Code Status: Full Code   DVT Prophylaxis: heparin injection 5,000 Units Start: 03/07/23 1400   Family Communication: None at bedside  Disposition Plan: Status is: Inpatient Remains inpatient appropriate because: Severity of illness   Planned Discharge Destination:Skilled nursing facility   Diet: Diet Order             Diet heart healthy/carb modified Fluid consistency: Thin  Diet effective now                   MEDICATIONS: Scheduled Meds:  aspirin  81 mg Oral Daily   atorvastatin  40 mg Oral Daily   bictegravir-emtricitabine-tenofovir AF  1 tablet Oral Q lunch   diltiazem  180 mg Oral Daily   DULoxetine  30 mg Oral Daily   fluticasone furoate-vilanterol  1 puff Inhalation Daily   gabapentin  100 mg Oral BID   heparin  5,000 Units Subcutaneous Q8H   hydrALAZINE  75 mg Oral Q8H   insulin aspart  0-6 Units Subcutaneous TID WC   lactulose  30 g Oral TID   linaclotide  290 mcg Oral QAC breakfast   multivitamin  1 tablet Oral QHS   pantoprazole  40 mg Oral Q1200   polyethylene glycol  17 g Oral BID   senna-docusate  2 tablet Oral BID   sodium bicarbonate  1,300 mg Oral BID   sodium chloride flush  3 mL Intravenous Q12H   Continuous Infusions:  PRN Meds:.acetaminophen **OR** acetaminophen, albuterol, bisacodyl, calcium carbonate (dosed in mg elemental calcium), camphor-menthol **AND** hydrOXYzine, hydrALAZINE, ondansetron **OR** ondansetron (ZOFRAN) IV, sorbitol, zolpidem   I have personally reviewed following labs and imaging studies  LABORATORY DATA:  Recent Labs  Lab 03/19/23 0942 03/20/23 0549  WBC 4.8 3.6*  HGB 9.4* 9.6*  HCT 30.1* 29.8*  PLT 159 149*  MCV 91.2 91.4  MCH 28.5 29.4  MCHC 31.2 32.2  RDW 15.8* 15.7*  LYMPHSABS 0.9 0.7  MONOABS 0.4 0.4  EOSABS 0.1 0.1  BASOSABS 0.0 0.0    Recent Labs  Lab 03/20/23 0549  NA 139  K 3.8  CL 104  CO2 23   ANIONGAP 12  GLUCOSE 112*  BUN 39*  CREATININE 4.34*  AST 24  ALT 27  ALKPHOS 73  BILITOT 0.8  ALBUMIN 2.7*  CALCIUM 8.3*    RADIOLOGY STUDIES/RESULTS: No results found.   LOS: 14 days   Signature  -    Susa Raring M.D on 03/22/2023 at 10:50 AM   -  To page go to [email or web address]

## 2023-03-23 DIAGNOSIS — G9341 Metabolic encephalopathy: Secondary | ICD-10-CM | POA: Diagnosis not present

## 2023-03-23 LAB — COMPREHENSIVE METABOLIC PANEL
ALT: 24 U/L (ref 0–44)
AST: 24 U/L (ref 15–41)
Albumin: 2.7 g/dL — ABNORMAL LOW (ref 3.5–5.0)
Alkaline Phosphatase: 66 U/L (ref 38–126)
Anion gap: 7 (ref 5–15)
BUN: 44 mg/dL — ABNORMAL HIGH (ref 8–23)
CO2: 22 mmol/L (ref 22–32)
Calcium: 7.7 mg/dL — ABNORMAL LOW (ref 8.9–10.3)
Chloride: 105 mmol/L (ref 98–111)
Creatinine, Ser: 4.23 mg/dL — ABNORMAL HIGH (ref 0.61–1.24)
GFR, Estimated: 14 mL/min — ABNORMAL LOW (ref >60)
Glucose, Bld: 125 mg/dL — ABNORMAL HIGH (ref 70–99)
Potassium: 4.4 mmol/L (ref 3.5–5.1)
Sodium: 134 mmol/L — ABNORMAL LOW (ref 135–145)
Total Bilirubin: 0.7 mg/dL (ref 0.3–1.2)
Total Protein: 6 g/dL — ABNORMAL LOW (ref 6.5–8.1)

## 2023-03-23 LAB — CBC WITH DIFFERENTIAL/PLATELET
Abs Immature Granulocytes: 0.06 10*3/uL (ref 0.00–0.07)
Basophils Absolute: 0 10*3/uL (ref 0.0–0.1)
Basophils Relative: 1 %
Eosinophils Absolute: 0.1 10*3/uL (ref 0.0–0.5)
Eosinophils Relative: 3 %
HCT: 27.7 % — ABNORMAL LOW (ref 39.0–52.0)
Hemoglobin: 8.7 g/dL — ABNORMAL LOW (ref 13.0–17.0)
Immature Granulocytes: 2 %
Lymphocytes Relative: 26 %
Lymphs Abs: 1 10*3/uL (ref 0.7–4.0)
MCH: 28.4 pg (ref 26.0–34.0)
MCHC: 31.4 g/dL (ref 30.0–36.0)
MCV: 90.5 fL (ref 80.0–100.0)
Monocytes Absolute: 0.5 10*3/uL (ref 0.1–1.0)
Monocytes Relative: 12 %
Neutro Abs: 2.2 10*3/uL (ref 1.7–7.7)
Neutrophils Relative %: 56 %
Platelets: 125 10*3/uL — ABNORMAL LOW (ref 150–400)
RBC: 3.06 MIL/uL — ABNORMAL LOW (ref 4.22–5.81)
RDW: 15.4 % (ref 11.5–15.5)
WBC: 3.9 10*3/uL — ABNORMAL LOW (ref 4.0–10.5)
nRBC: 0 % (ref 0.0–0.2)

## 2023-03-23 LAB — GLUCOSE, CAPILLARY
Glucose-Capillary: 125 mg/dL — ABNORMAL HIGH (ref 70–99)
Glucose-Capillary: 124 mg/dL — ABNORMAL HIGH (ref 70–99)
Glucose-Capillary: 177 mg/dL — ABNORMAL HIGH (ref 70–99)
Glucose-Capillary: 192 mg/dL — ABNORMAL HIGH (ref 70–99)

## 2023-03-23 LAB — PHOSPHORUS: Phosphorus: 3.9 mg/dL (ref 2.5–4.6)

## 2023-03-23 LAB — MAGNESIUM
Magnesium: 1.5 mg/dL — ABNORMAL LOW (ref 1.7–2.4)
Magnesium: 2.6 mg/dL — ABNORMAL HIGH (ref 1.7–2.4)

## 2023-03-23 MED ORDER — MAGNESIUM SULFATE 4 GM/100ML IV SOLN
4.0000 g | Freq: Once | INTRAVENOUS | Status: AC
  Administered 2023-03-23: 4 g via INTRAVENOUS
  Filled 2023-03-23: qty 100

## 2023-03-23 NOTE — Progress Notes (Addendum)
Mobility Specialist: Progress Note   03/23/23 1713  Mobility  Activity Ambulated with assistance in hallway  Level of Assistance Contact guard assist, steadying assist  Assistive Device Four wheel walker  Distance Ambulated (ft) 200 ft  Activity Response Tolerated well  Mobility Referral Yes  $Mobility charge 1 Mobility  Mobility Specialist Start Time (ACUTE ONLY) 1649  Mobility Specialist Stop Time (ACUTE ONLY) 1510  Mobility Specialist Time Calculation (min) (ACUTE ONLY) 1341 min   Pre-Mobility: 62 HR, 100% SpO2 Post-Mobility: 67 HR, 100% SpO2  Received pt in chair having no complaints and agreeable to mobility. Pt was asymptomatic throughout ambulation and returned to room w/o fault. Left in chair w/ call bell in reach and all needs met.  Michael [name] Mobility Specialist Please contact via SecureChat or Rehab office at [phone]

## 2023-03-23 NOTE — Progress Notes (Signed)
PROGRESS NOTE        PATIENT DETAILS Name: Michael [name] Age: 72 y.o. Sex: male Date of Birth: 1951/04/28 Admit Date: 03/07/2023 Admitting Physician Jonah Blue, MD ZOX:WRUEA, [name], MD  Brief Summary: Patient is a 72 y.o.  male with history of HFpEF, CAD s/p CABG, HTN, DM-2, PAF, COPD, mood disorder, HIV, cocaine use, homelessness-presented with confusion-in the setting of crack cocaine use and progression of CKD.  Patient was subsequently admitted to the hospitalist service.  See below for further details.  Significant events: 5/31>> admit to [facility].  Significant studies: 5/31>> CT head: no acute abnormality. 5/31>>CT C-spine: No fracture 5/31>> CT chest: Mild bronchial thickening/tree-in-bud opacities in the right lower lobe concerning 5/31 >> CT abdomen/pelvis: No evidence for acute abdominal process.  For infectious/inflammatory process 6/5>> CT abdomen/pelvis: No acute abnormality-moderate stool burden  Significant microbiology data: 5/31>> blood culture: No growth  Procedures: 6/13>> colonoscopy: 2 polyps descending colon, nonbleeding internal hemorrhoids 6/13>> EGD: Gastritis/erythematous duodenopathy  Consults: Infectious disease Nephrology GI  Subjective:   Patient in bed, appears comfortable, denies any headache, no fever, no chest pain or pressure, no shortness of breath , no abdominal pain. No new focal weakness.   Objective: Vitals: Blood pressure (!) 146/89, pulse 84, temperature 97.8 F (36.6 C), temperature source Oral, resp. rate 18, height 5\' 8"  (1.727 m), weight 70.7 kg, SpO2 96 %.   Exam:  Awake Alert, No new F.N deficits, Normal affect [place].AT,PERRAL Supple Neck, No JVD,   Symmetrical Chest wall movement, Good air movement bilaterally, CTAB RRR,No Gallops, Rubs or new Murmurs,  +ve B.Sounds, Abd Soft, No tenderness,   No Cyanosis, Clubbing or edema   Assessment/Plan:  Acute toxic encephalopathy Secondary to  crack cocaine use Resolved-completely awake/alert.  CKD 4 with progression to CKD 5 Stable with no indications for acute HD.  Per nephrology-poor HD candidate due to noncompliance homelessness. Follow renal function periodically.  Normocytic anemia Secondary to underlying CKD Follow CBC periodically  Abdominal pain Constipation-change in bowel habit CT Abdomen/pelvis x 2 negative for acute abnormalities EGD/colonoscopy as above-follow biopsy results Abdominal pain is chronic going on for several months-workup as above-doubt further workup is required.  Suspect worsened recently due to constipation. Continue MiraLAX/senna/Linzess Dulcolax for moderate constipation Soapsuds for severe constipation  Acute on chronic HFpEF Euvolemic  CAD s/p CABG No anginal symptoms Continue aspirin/Lipitor  PAF Maintaining sinus rhythm Diltiazem Not a candidate for anticoagulation due to homelessness/noncompliance  HTN BP stable with hydralazine/Cardizem  HIV Antiretrovirals per ID.  COPD Not in exacerbation Continue bronchodilators  Mood disorder Cymbalta  DM-2 (A1c 6.8 on 12/25) CBG stable with SSI  Recent Labs    03/22/23 1216 03/22/23 1554 03/22/23 2154  GLUCAP 131* 190* 191*      Debility/deconditioning PT/OT eval recommending SNF-Per social work-will be difficult to place given history of drug use Plan is to continue PT/OT-once improves-he will be discharged back to his prior living situation.   He tells me he will not go back to shelter-I have encouraged him to talk to his sister who lives in [place] to see if he can live with her.  [facility]eless[facility]   Nutrition Status: Nutrition Problem: Moderate Malnutrition Etiology: chronic illness Signs/Symptoms: moderate muscle depletion, mild fat depletion Interventions: Magic cup, MVI    BMI: Estimated body mass index is 23.7  kg/m as calculated from the following:   Height as of this  encounter: 5\' 8"  (1.727 m).   Weight as of this encounter: 70.7 kg.   Code status:   Code Status: Full Code   DVT Prophylaxis: heparin injection 5,000 Units Start: 03/07/23 1400   Family Communication: None at bedside  Disposition Plan: Status is: Inpatient Remains inpatient appropriate because: Severity of illness   Planned Discharge Destination:Skilled nursing facility   Diet: Diet Order             Diet heart healthy/carb modified Fluid consistency: Thin  Diet effective now                   MEDICATIONS: Scheduled Meds:  aspirin  81 mg Oral Daily   atorvastatin  40 mg Oral Daily   bictegravir-emtricitabine-tenofovir AF  1 tablet Oral Q lunch   diltiazem  180 mg Oral Daily   DULoxetine  30 mg Oral Daily   fluticasone furoate-vilanterol  1 puff Inhalation Daily   gabapentin  100 mg Oral BID   heparin  5,000 Units Subcutaneous Q8H   hydrALAZINE  75 mg Oral Q8H   insulin aspart  0-6 Units Subcutaneous TID WC   linaclotide  290 mcg Oral QAC breakfast   multivitamin  1 tablet Oral QHS   pantoprazole  40 mg Oral Q1200   polyethylene glycol  17 g Oral BID   senna-docusate  2 tablet Oral BID   sodium bicarbonate  1,300 mg Oral BID   sodium chloride flush  3 mL Intravenous Q12H   Continuous Infusions:  magnesium sulfate bolus IVPB 4 g (03/23/23 0611)    PRN Meds:.acetaminophen **OR** acetaminophen, albuterol, bisacodyl, calcium carbonate (dosed in mg elemental calcium), camphor-menthol **AND** hydrOXYzine, hydrALAZINE, ondansetron **OR** ondansetron (ZOFRAN) IV, sorbitol, zolpidem   I have personally reviewed following labs and imaging studies  LABORATORY DATA:  Recent Labs  Lab 03/19/23 0942 03/20/23 0549 03/23/23 0330  WBC 4.8 3.6* 3.9*  HGB 9.4* 9.6* 8.7*  HCT 30.1* 29.8* 27.7*  PLT 159 149* 125*  MCV 91.2 91.4 90.5  MCH 28.5 29.4 28.4  MCHC 31.2 32.2 31.4  RDW 15.8* 15.7* 15.4  LYMPHSABS 0.9 0.7 1.0  MONOABS 0.4 0.4 0.5  EOSABS 0.1 0.1 0.1   BASOSABS 0.0 0.0 0.0    Recent Labs  Lab 03/20/23 0549 03/23/23 0330  NA 139 134*  K 3.8 4.4  CL 104 105  CO2 23 22  ANIONGAP 12 7  GLUCOSE 112* 125*  BUN 39* 44*  CREATININE 4.34* 4.23*  AST 24 24  ALT 27 24  ALKPHOS 73 66  BILITOT 0.8 0.7  ALBUMIN 2.7* 2.7*  MG  --  1.5*  CALCIUM 8.3* 7.7*    RADIOLOGY STUDIES/RESULTS: No results found.   LOS: 15 days   Signature  -    Susa Raring M.D on 03/23/2023 at 7:32 AM   -  To page go to [email or web address]

## 2023-03-23 NOTE — Plan of Care (Signed)
  Problem: Health Behavior/Discharge Planning: Goal: Ability to manage health-related needs will improve Outcome: Progressing   Problem: Clinical Measurements: Goal: Ability to maintain clinical measurements within normal limits will improve Outcome: Progressing Goal: Will remain free from infection Outcome: Progressing Goal: Diagnostic test results will improve Outcome: Progressing Goal: Respiratory complications will improve Outcome: Progressing Goal: Cardiovascular complication will be avoided Outcome: Progressing   Problem: Activity: Goal: Risk for activity intolerance will decrease Outcome: Progressing   Problem: Nutrition: Goal: Adequate nutrition will be maintained Outcome: Progressing   Problem: Coping: Goal: Level of anxiety will decrease Outcome: Progressing   Problem: Elimination: Goal: Will not experience complications related to bowel motility Outcome: Progressing Goal: Will not experience complications related to urinary retention Outcome: Progressing   Problem: Pain Managment: Goal: General experience of comfort will improve Outcome: Progressing   Problem: Safety: Goal: Ability to remain free from injury will improve Outcome: Progressing   Problem: Skin Integrity: Goal: Risk for impaired skin integrity will decrease Outcome: Progressing   Problem: Education: Goal: Ability to describe self-care measures that may prevent or decrease complications (Diabetes Survival Skills Education) will improve Outcome: Progressing Goal: Individualized Educational Video(s) Outcome: Progressing   Problem: Coping: Goal: Ability to adjust to condition or change in health will improve Outcome: Progressing   Problem: Fluid Volume: Goal: Ability to maintain a balanced intake and output will improve Outcome: Progressing   Problem: Health Behavior/Discharge Planning: Goal: Ability to identify and utilize available resources and services will improve Outcome:  Progressing Goal: Ability to manage health-related needs will improve Outcome: Progressing   Problem: Metabolic: Goal: Ability to maintain appropriate glucose levels will improve Outcome: Progressing   Problem: Nutritional: Goal: Maintenance of adequate nutrition will improve Outcome: Progressing Goal: Progress toward achieving an optimal weight will improve Outcome: Progressing   Problem: Skin Integrity: Goal: Risk for impaired skin integrity will decrease Outcome: Progressing   Problem: Tissue Perfusion: Goal: Adequacy of tissue perfusion will improve Outcome: Progressing   

## 2023-03-24 ENCOUNTER — Other Ambulatory Visit (HOSPITAL_COMMUNITY): Payer: Self-pay

## 2023-03-24 ENCOUNTER — Encounter (HOSPITAL_COMMUNITY): Payer: Self-pay | Admitting: Gastroenterology

## 2023-03-24 DIAGNOSIS — G9341 Metabolic encephalopathy: Secondary | ICD-10-CM | POA: Diagnosis not present

## 2023-03-24 LAB — GLUCOSE, CAPILLARY: Glucose-Capillary: 131 mg/dL — ABNORMAL HIGH (ref 70–99)

## 2023-03-24 MED ORDER — DOCUSATE SODIUM 100 MG PO CAPS
200.0000 mg | ORAL_CAPSULE | Freq: Two times a day (BID) | ORAL | 0 refills | Status: AC
  Filled 2023-03-24: qty 120, 30d supply, fill #0

## 2023-03-24 MED ORDER — SORBITOL 70 % SOLN
30.0000 mL | 0 refills | Status: DC | PRN
  Filled 2023-03-24: qty 473, 16d supply, fill #0

## 2023-03-24 MED ORDER — PANTOPRAZOLE SODIUM 40 MG PO TBEC
40.0000 mg | DELAYED_RELEASE_TABLET | Freq: Every day | ORAL | 0 refills | Status: DC
  Filled 2023-03-24: qty 30, 30d supply, fill #0

## 2023-03-24 MED ORDER — POLYETHYLENE GLYCOL 3350 17 GM/SCOOP PO POWD
17.0000 g | Freq: Every day | ORAL | 0 refills | Status: DC
  Filled 2023-03-24: qty 476, 28d supply, fill #0

## 2023-03-24 MED ORDER — METFORMIN HCL 500 MG PO TABS
500.0000 mg | ORAL_TABLET | Freq: Two times a day (BID) | ORAL | 0 refills | Status: DC
  Filled 2023-03-24: qty 60, 30d supply, fill #0

## 2023-03-24 MED ORDER — LINACLOTIDE 290 MCG PO CAPS
290.0000 ug | ORAL_CAPSULE | Freq: Every day | ORAL | 0 refills | Status: DC
  Filled 2023-03-24: qty 30, 30d supply, fill #0

## 2023-03-24 MED ORDER — SODIUM BICARBONATE 650 MG PO TABS
650.0000 mg | ORAL_TABLET | Freq: Three times a day (TID) | ORAL | 0 refills | Status: AC
  Filled 2023-03-24: qty 90, 30d supply, fill #0

## 2023-03-24 MED ORDER — SENNOSIDES-DOCUSATE SODIUM 8.6-50 MG PO TABS
2.0000 | ORAL_TABLET | Freq: Every evening | ORAL | 0 refills | Status: DC | PRN
  Filled 2023-03-24: qty 30, 15d supply, fill #0

## 2023-03-24 NOTE — TOC Transition Note (Signed)
Transition of Care [facility]) - CM/SW Discharge Note   Patient Details  Name: Michael [name] MRN: [id number] Date of Birth: 05-06-1951  Transition of Care [facility]) CM/SW Contact:  Gordy Clement, RN Phone Number: 03/24/2023, 9:01 AM   Clinical Narrative:     Patient to dc to home (Brother's house) today  A rollator has been delivered bedside by Loyal Buba and Frances Furbish will be providing Home Health Physical Therapy  AVS updated     No additional TOC needs       Barriers to Discharge: Continued Medical Work up, Homeless with medical needs, Active Substance Use - Placement, SNF Pending bed offer, Insurance Authorization   Patient Goals and CMS Choice      Discharge Placement                         Discharge Plan and Services Additional resources added to the After Visit Summary for                                       Social Determinants of Health (SDOH) Interventions SDOH Screenings   Food Insecurity: Food Insecurity Present (03/07/2023)  Housing: Patient Unable To Answer (03/07/2023)  Recent Concern: Housing - Medium Risk (02/14/2023)  Transportation Needs: Unmet Transportation Needs (03/07/2023)  Utilities: Not At Risk (03/07/2023)  Alcohol Screen: Low Risk  (11/27/2022)  Depression (PHQ2-9): High Risk (11/12/2021)  Financial Resource Strain: Medium Risk (09/16/2018)  Physical Activity: Unknown (04/22/2019)  Social Connections: Socially Isolated (09/16/2018)  Stress: Stress Concern Present (09/16/2018)  Tobacco Use: Low Risk  (03/20/2023)     Readmission Risk Interventions    03/14/2023    3:46 PM 02/17/2023    1:01 PM 07/05/2022   10:08 AM  Readmission Risk Prevention Plan  Transportation Screening Complete Complete Complete  Medication Review Oceanographer) Complete Complete Complete  PCP or Specialist appointment within 3-5 days of discharge Complete Complete Complete  HRI or Home Care Consult Complete Complete Complete  SW Recovery Care/Counseling  Consult Complete Complete Complete  Palliative Care Screening Not Applicable Not Applicable Not Applicable  Skilled Nursing Facility Complete Not Applicable Not Applicable

## 2023-03-24 NOTE — Progress Notes (Signed)
Physical Therapy Treatment Patient Details Name: Michael [name] MRN: [id number] DOB: August 06, 1951 Today's Date: 03/24/2023   History of Present Illness 72 year old male with history of HIV, homelessness, cocaine abuse, PAF, CKD stage IV, CAD status post CABG, IDDM who returns to the hospital on 03/07/23 after a recent admission on 02/14/23  with community-acquired pneumonia as well as suicidal ideation.  This admission pt presents with AMS, chest pain and CKD. He is well-known to our system with frequent ED visits, this being his 12th visit in the past month.    PT Comments    Pt tolerated today's session well, eager to go home and return to his PLOF. Pt continues to tolerate use of rollator well, safely navigating the walker in the hallway and managing his brakes properly. Pt performed brief hallway ambulation trial without AD and tolerated well, no overt LOB but moderate imbalance progressing to minimal with increased distance, gait speed and B step length noted to be decreased compared to use of rollator. Pt reports no concerns with mobility upon discharge but eager to progress to ambulation without AD. Acute PT will continue to follow up with pt to progress mobility during admission, discharge recommendations remain appropriate.     Recommendations for follow up therapy are one component of a multi-disciplinary discharge planning process, led by the attending physician.  Recommendations may be updated based on patient status, additional functional criteria and insurance authorization.  Follow Up Recommendations  Can patient physically be transported by private vehicle: Yes    Assistance Recommended at Discharge Frequent or constant Supervision/Assistance  Patient can return home with the following A little help with bathing/dressing/bathroom;Assistance with cooking/housework;Direct supervision/assist for medications management;Assist for transportation;Help with stairs or ramp for  entrance;Direct supervision/assist for financial management;A little help with walking and/or transfers   Equipment Recommendations  Rollator (4 wheels)    Recommendations for Other Services       Precautions / Restrictions Precautions Precautions: Fall Precaution Comments: 3 falls in past 6 months Restrictions Weight Bearing Restrictions: No     Mobility  Bed Mobility               General bed mobility comments: pt up in chair on arrival    Transfers Overall transfer level: Needs assistance Equipment used: Rollator (4 wheels) Transfers: Sit to/from Stand Sit to Stand: Supervision           General transfer comment: pt with good recall of brake management on rollator, supervision provided for safety, pt with proper hand placement    Ambulation/Gait Ambulation/Gait assistance: Supervision Gait Distance (Feet): 200 Feet (50 feet with no AD) Assistive device: Rollator (4 wheels), None Gait Pattern/deviations: Decreased stride length, Decreased dorsiflexion - right, Decreased dorsiflexion - left, Drifts right/left, Step-through pattern Gait velocity: decreased     General Gait Details: pt with good management of rollator around obstacle, slow steady gait, intermittent cues for forward gaze and increasing B foot clearance. Able to perform seated rest break with rollator when asked, parking it against the wall and good management of brakes demonstrated. Pt with brief trial of ambulation without AD, gait speed and B step length decreased with pt initially ambulating close to the wall for intermittent UE support but progressing to no UE support with increased time   Stairs             Wheelchair Mobility    Modified Rankin (Stroke Patients Only)       Balance Overall balance assessment: History of Falls, Needs assistance  Sitting-balance support: Feet supported, No upper extremity supported Sitting balance-Leahy Scale: Good Sitting balance - Comments:  able to donn socks in sitting   Standing balance support: During functional activity, Bilateral upper extremity supported, Reliant on assistive device for balance, No upper extremity supported Standing balance-Leahy Scale: Fair Standing balance comment: reliant on rollator for ambulation but able to stand statically without UE support and brief ambulation trial without AD                            Cognition Arousal/Alertness: Awake/alert Behavior During Therapy: WFL for tasks assessed/performed Overall Cognitive Status: Impaired/Different from baseline Area of Impairment: Safety/judgement, Awareness, Problem solving                         Safety/Judgement: Decreased awareness of deficits Awareness: Emergent Problem Solving: Slow processing, Requires verbal cues, Decreased initiation General Comments: pleasant throughout session, good recall of rollator use and brakes        Exercises      General Comments General comments (skin integrity, edema, etc.): VSS      Pertinent Vitals/Pain Pain Assessment Pain Assessment: Faces Faces Pain Scale: No hurt    Home Living                          Prior Function            PT Goals (current goals can now be found in the care plan section) Acute Rehab PT Goals Patient Stated Goal: get stronger PT Goal Formulation: With patient Time For Goal Achievement: 04/04/23 Potential to Achieve Goals: Good Progress towards PT goals: Progressing toward goals    Frequency    Min 4X/week      PT Plan Current plan remains appropriate    Co-evaluation              AM-PAC PT "6 Clicks" Mobility   Outcome Measure  Help needed turning from your back to your side while in a flat bed without using bedrails?: A Little Help needed moving from lying on your back to sitting on the side of a flat bed without using bedrails?: A Little Help needed moving to and from a bed to a chair (including a  wheelchair)?: A Little Help needed standing up from a chair using your arms (e.g., wheelchair or bedside chair)?: A Little Help needed to walk in hospital room?: A Little Help needed climbing 3-5 steps with a railing? : A Lot 6 Click Score: 17    End of Session Equipment Utilized During Treatment: Gait belt Activity Tolerance: Patient tolerated treatment well Patient left: with call bell/phone within reach;in chair;with chair alarm set Nurse Communication: Mobility status PT Visit Diagnosis: Muscle weakness (generalized) (M62.81);History of falling (Z91.81);Difficulty in walking, not elsewhere classified (R26.2)     Time: 6295-2841 PT Time Calculation (min) (ACUTE ONLY): 21 min  Charges:  $Gait Training: 8-22 mins                     Lindalou Hose, PT DPT Acute Rehabilitation Services Office [phone]    Michael [name] 03/24/2023, 2:33 PM

## 2023-03-24 NOTE — Discharge Instructions (Signed)
Follow with Primary MD [name], MD in 7 days   Get CBC, CMP, 2 view Chest X ray -  checked next visit with your primary MD    Activity: As tolerated with Full fall precautions use walker/cane & assistance as needed  Disposition Home    Diet: Heart Healthy low carbohydrate diet, check CBGs q. [facility] S.  Special Instructions: If you have smoked or chewed Tobacco  in the last 2 yrs please stop smoking, stop any regular Alcohol  and or any Recreational drug use.  On your next visit with your primary care physician please Get Medicines reviewed and adjusted.  Please request your Prim.MD to go over all Hospital Tests and Procedure/Radiological results at the follow up, please get all Hospital records sent to your Prim MD by signing hospital release before you go home.  If you experience worsening of your admission symptoms, develop shortness of breath, life threatening emergency, suicidal or homicidal thoughts you must seek medical attention immediately by calling 911 or calling your MD immediately  if symptoms less severe.  You Must read complete instructions/literature along with all the possible adverse reactions/side effects for all the Medicines you take and that have been prescribed to you. Take any new Medicines after you have completely understood and accpet all the possible adverse reactions/side effects.

## 2023-03-24 NOTE — Discharge Summary (Addendum)
Michael [name] ZOX:[id number] DOB: [birth year] DOA: 03/07/2023  PCP: [name], MD  Admit date: 03/07/2023  Discharge date: 03/24/2023  Admitted From: Home   Disposition:  Home   Recommendations for Outpatient Follow-up:   Follow up with PCP in 1-2 weeks  PCP Please obtain BMP/CBC, 2 view CXR in 1week,  (see Discharge instructions)   PCP Please follow up on the following pending results:    Home Health: PT   Equipment/Devices: As below  Discharge Condition: Stable    CODE STATUS: Full    Diet Recommendation: Heart Healthy Low Carb  Diet Order             Diet heart healthy/carb modified Fluid consistency: Thin  Diet effective now                    Chief Complaint  Patient presents with   Altered Mental Status     Brief history of present illness from the day of admission and additional interim summary    72 y.o.  male with history of HFpEF, CAD s/p CABG, HTN, DM-2, PAF, COPD, mood disorder, HIV, cocaine use, homelessness-presented with confusion-in the setting of crack cocaine use and progression of CKD.  Patient was subsequently admitted to the hospitalist service.  See below for further details.   Significant events: 5/31>> admit to [id number].   Significant studies: 5/31>> CT head: no acute abnormality. 5/31>>CT C-spine: No fracture 5/31>> CT chest: Mild bronchial thickening/tree-in-bud opacities in the right lower lobe concerning 5/31 >> CT abdomen/pelvis: No evidence for acute abdominal process.  For infectious/inflammatory process 6/5>> CT abdomen/pelvis: No acute abnormality-moderate stool burden   Significant microbiology data: 5/31>> blood culture: No growth   Procedures: 6/13>> colonoscopy: 2 polyps descending colon, nonbleeding internal hemorrhoids 6/13>> EGD:  Gastritis/erythematous duodenopathy   Biopsy -  FINAL MICROSCOPIC DIAGNOSIS:   A. DUODENUM, BIOPSY:  - Chronic duodenitis with surface gastric foveolar metaplasia,  suggestive of peptic duodenitis   B. STOMACH, BIOPSY:  - Gastric antral mucosa with mild nonspecific reactive gastropathy  - Gastric oxyntic mucosa with no specific histopathologic changes  - Helicobacter pylori-like organisms are not identified on routine HE  stain   C. COLON, DESCENDING, POLYPECTOMY:  - Tubular adenoma(s)  - Negative for high-grade dysplasia or malignancy    Consults: Infectious disease Nephrology GI                                                                 Hospital Course   Acute toxic encephalopathy Secondary to crack cocaine use Resolved-completely awake/alert.   CKD 4 with progression to CKD 5 Stable with no indications for acute HD.  Per nephrology-poor HD candidate, follow-up with PCP for follow-up, currently being discharged to brother's home per patient.   Normocytic anemia Secondary to  underlying CKD Follow CBC periodically   Abdominal pain Constipation-change in bowel habit CT Abdomen/pelvis x 2 negative for acute abnormalities EGD/colonoscopy as above-  follow-up with GI postdischarge as well.  Placed on bowel regimen.  Currently symptom-free.    Acute on chronic HFpEF Euvolemic   CAD s/p CABG No anginal symptoms Continue aspirin/Lipitor   PAF Maintaining sinus rhythm Diltiazem, continue Not a candidate for anticoagulation due to homelessness/noncompliance   HTN BP stable with hydralazine/Cardizem   HIV Antiretrovirals per ID.  Discharge follow-up with PCP and ID.   COPD Not in exacerbation Continue bronchodilators   Mood disorder Cymbalta   DM-2 (A1c 6.8 on 12/25) A1c 6.8, low carb diet, PCP to monitor.    Discharge diagnosis     Principal Problem:   Acute metabolic encephalopathy Active Problems:   Paroxysmal A-fib ([name])   HIV (human  immunodeficiency virus infection) ([name])   Human immunodeficiency virus (HIV) disease ([name])   Polysubstance abuse ([name])   COPD (chronic obstructive pulmonary disease) ([name])   Hypertension goal BP (blood pressure) < 140/80   Substance induced mood disorder ([name])   Pneumonia of right lung due to infectious organism   Type II diabetes mellitus with renal manifestations ([name])   S/P CABG (coronary artery bypass graft)   Malnutrition of moderate degree   CKD (chronic kidney disease), stage IV ([name])   Generalized abdominal pain   Pure hypercholesterolemia   Chronic constipation   Change in bowel habits   Nausea without vomiting   Heartburn   Adenomatous polyp of descending colon   Grade I internal hemorrhoids   Gastritis and gastroduodenitis    Discharge instructions    Discharge Instructions     Discharge instructions   Complete by: As directed    Generalized weakness, polysubstance abuse   Increase activity slowly   Complete by: As directed        Discharge Medications   Allergies as of 03/24/2023   No Known Allergies      Medication List     STOP taking these medications    Edurant 25 MG Tabs tablet Generic drug: rilpivirine   Tivicay 50 MG tablet Generic drug: dolutegravir       TAKE these medications    albuterol 108 (90 Base) MCG/ACT inhaler Commonly known as: VENTOLIN HFA Inhale 1-2 puffs into the lungs every 6 (six) hours as needed for wheezing or shortness of breath.   aspirin 81 MG chewable tablet Chew 1 tablet (81 mg total) by mouth daily.   atorvastatin 40 MG tablet Commonly known as: LIPITOR Take 1 tablet (40 mg total) by mouth daily.   Biktarvy 50-200-25 MG Tabs tablet Generic drug: bictegravir-emtricitabine-tenofovir AF Take 1 tablet by mouth daily.   Breo Ellipta 200-25 MCG/ACT Aepb Generic drug: fluticasone furoate-vilanterol Inhale 1 puff into the lungs daily.   diltiazem 180 MG 24 hr capsule Commonly known as: CARDIZEM CD Take  1 capsule (180 mg total) by mouth daily.   docusate sodium 100 MG capsule Commonly known as: Colace Take 2 capsules (200 mg total) by mouth 2 (two) times daily.   DULoxetine 30 MG capsule Commonly known as: CYMBALTA Take 1 capsule (30 mg total) by mouth daily.   linaclotide 290 MCG Caps capsule Commonly known as: LINZESS Take 1 capsule (290 mcg total) by mouth daily before breakfast.   nitroGLYCERIN 0.4 MG SL tablet Commonly known as: NITROSTAT Place 1 tablet (0.4 mg total) under the tongue every 5 (five) minutes as needed for chest pain.  pantoprazole 40 MG tablet Commonly known as: PROTONIX Take 1 tablet (40 mg total) by mouth daily at 12 noon.   polyethylene glycol 17 g packet Commonly known as: MIRALAX / GLYCOLAX Take 17 g by mouth daily. What changed: when to take this   senna-docusate 8.6-50 MG tablet Commonly known as: Senokot-S Take 2 tablets by mouth at bedtime as needed for mild constipation.   sodium bicarbonate 650 MG tablet Take 1 tablet (650 mg total) by mouth 3 (three) times daily.   sorbitol 70 % Soln Take 30 mLs by mouth as needed for moderate constipation.               Durable Medical Equipment  (From admission, onward)           Start     Ordered   03/24/23 0753  For home use only DME Walker rolling  Once       Comments: 5 wheel  Question Answer Comment  Walker: With 5 Inch Wheels   Patient needs a walker to treat with the following condition Weakness      03/24/23 0752   03/19/23 1555  For home use only DME 4 wheeled rolling walker with seat  Once       Question:  Patient needs a walker to treat with the following condition  Answer:  Acute metabolic encephalopathy   03/19/23 1555             Follow-up Information     [facility] Follow up.   Why: [employer] has provided you with the rollator Contact information: [address] [place], [place]  [address]  [phone]        [facility] Follow up.    Specialty: Home Health Services Why: Frances Furbish will call to schedule a home visit .  They have been instructed to call [facility]'s number [phone] Contact information: [address] [place] [address] [phone]         [facility]. Schedule an appointment as soon as possible for a visit in 1 week(s).   Contact information: [address] [place] [address] [phone]        Gardiner Barefoot, MD. Schedule an appointment as soon as possible for a visit in 1 week(s).   Specialty: Infectious Diseases Why: HIV Contact information: [address] [place] [address] [phone]                 Major procedures and Radiology Reports - PLEASE review detailed and final reports thoroughly  -      CT ABDOMEN PELVIS WO CONTRAST  Result Date: 03/12/2023 CLINICAL DATA:  Acute nonlocalized abdominal pain EXAM: CT ABDOMEN AND PELVIS WITHOUT CONTRAST TECHNIQUE: Multidetector CT imaging of the abdomen and pelvis was performed following the standard protocol without IV contrast. RADIATION DOSE REDUCTION: This exam was performed according to the departmental dose-optimization program which includes automated exposure control, adjustment of the mA and/or kV according to patient size and/or use of iterative reconstruction technique. COMPARISON:  Abdomen radiographs 03/12/2023 and CT chest abdomen and pelvis 03/07/2019 FINDINGS: Lower chest: Bibasilar atelectasis/scarring. Centrilobular micronodules in the right lower lobe, likely infectious/inflammatory. Hepatobiliary: No focal liver abnormality is seen. No gallstones, gallbladder wall thickening, or biliary dilatation. Pancreas: Unchanged 9 mm cystic lesion in the tail of the pancreas (6/54) dating back to 09/02/2018. No pancreatic ductal dilatation or surrounding inflammatory changes. Spleen: Unremarkable. Adrenals/Urinary Tract: Stable adrenal  glands.  No urinary calculi or hydronephrosis. Unremarkable bladder. Stomach/Bowel: Normal caliber large and small bowel. Colonic diverticulosis without diverticulitis. Moderate colonic stool load. Enteric contrast is present within the distal small bowel and colon. The appendix is not definitively visualized. No secondary signs of appendicitis. Vascular/Lymphatic: Aortic atherosclerosis. No enlarged abdominal or pelvic lymph nodes. Reproductive: Postsurgical changes from TURP. Other: No free intraperitoneal fluid or air. Musculoskeletal: Thoracolumbar spondylosis. Advanced degenerative arthritis left hip. No acute fracture. Postoperative change about the sacrum. IMPRESSION: 1. No acute abnormality identified in the abdomen or pelvis. 2. Colonic diverticulosis without diverticulitis. Moderate colonic stool load. 3. Centrilobular micronodules in the right lower lobe, likely infectious/inflammatory. Aortic Atherosclerosis (ICD10-I70.0). Electronically Signed   By: Minerva Fester M.D.   On: 03/12/2023 20:31   DG ABD ACUTE 2+V W 1V CHEST  Result Date: 03/12/2023 CLINICAL DATA:  Nausea, vomiting, abdominal pain. EXAM: DG ABDOMEN ACUTE WITH 1 VIEW CHEST COMPARISON:  Mar 05, 2023. FINDINGS: There is no evidence of dilated bowel loops or free intraperitoneal air. Phleboliths are noted in pelvis. Heart size and mediastinal contours are within normal limits. Both lungs are clear. IMPRESSION: No abnormal bowel dilatation.  No acute cardiopulmonary disease. Electronically Signed   By: Lupita Raider M.D.   On: 03/12/2023 08:35   VAS [place] LOWER EXTREMITY VENOUS (DVT)  Result Date: 03/08/2023  Lower Venous DVT Study Patient Name:  Michael [name]  Date of Exam:   03/08/2023 Medical Rec #: [id number]            Accession #:    [id number] Date of Birth: [birth year]            Patient Gender: M Patient Age:   60 years Exam Location:  [facility] Procedure:      VAS [place] LOWER EXTREMITY VENOUS (DVT) Referring Phys: DAWOOD ELGERGAWY  --------------------------------------------------------------------------------  Indications: Edema.  Comparison Study: Previous study 01/23/23 negative. Performing Technologist: McKayla Maag RVT, [place]  Examination Guidelines: A complete evaluation includes B-mode imaging, spectral Doppler, color Doppler, and power Doppler as needed of all accessible portions of each vessel. Bilateral testing is considered an integral part of a complete examination. Limited examinations for reoccurring indications may be performed as noted. The reflux portion of the exam is performed with the patient in reverse Trendelenburg.  +---------+---------------+---------+-----------+----------+--------------+ RIGHT    CompressibilityPhasicitySpontaneityPropertiesThrombus Aging +---------+---------------+---------+-----------+----------+--------------+ CFV      Full           Yes      Yes                                 +---------+---------------+---------+-----------+----------+--------------+ SFJ      Full                                                        +---------+---------------+---------+-----------+----------+--------------+ FV Prox  Full                                                        +---------+---------------+---------+-----------+----------+--------------+ FV Mid   Full                                                        +---------+---------------+---------+-----------+----------+--------------+  FV DistalFull                                                        +---------+---------------+---------+-----------+----------+--------------+ PFV      Full                                                        +---------+---------------+---------+-----------+----------+--------------+ POP      Full           Yes      Yes                                 +---------+---------------+---------+-----------+----------+--------------+ PTV      Full                                                         +---------+---------------+---------+-----------+----------+--------------+ PERO     Full                                                        +---------+---------------+---------+-----------+----------+--------------+   +---------+---------------+---------+-----------+----------+--------------+ LEFT     CompressibilityPhasicitySpontaneityPropertiesThrombus Aging +---------+---------------+---------+-----------+----------+--------------+ CFV      Full           Yes      Yes                                 +---------+---------------+---------+-----------+----------+--------------+ SFJ      Full                                                        +---------+---------------+---------+-----------+----------+--------------+ FV Prox  Full                                                        +---------+---------------+---------+-----------+----------+--------------+ FV Mid   Full                                                        +---------+---------------+---------+-----------+----------+--------------+ FV DistalFull                                                        +---------+---------------+---------+-----------+----------+--------------+  PFV      Full                                                        +---------+---------------+---------+-----------+----------+--------------+ POP      Full           Yes      Yes                                 +---------+---------------+---------+-----------+----------+--------------+ PTV      Full                                                        +---------+---------------+---------+-----------+----------+--------------+ PERO     Full                                                        +---------+---------------+---------+-----------+----------+--------------+     Summary: BILATERAL: - No evidence of deep vein thrombosis seen in the lower  extremities, bilaterally. - No evidence of superficial venous thrombosis in the lower extremities, bilaterally. -No evidence of popliteal cyst, bilaterally.   *See table(s) above for measurements and observations. Electronically signed by Sherald Hess MD on 03/08/2023 at 2:35:56 PM.    Final    CT CHEST ABDOMEN PELVIS WO CONTRAST  Result Date: 03/07/2023 CLINICAL DATA:  Diffuse pain, acute kidney injury, renal failure. EXAM: CT CHEST, ABDOMEN AND PELVIS WITHOUT CONTRAST TECHNIQUE: Multidetector CT imaging of the chest, abdomen and pelvis was performed following the standard protocol without IV contrast. RADIATION DOSE REDUCTION: This exam was performed according to the departmental dose-optimization program which includes automated exposure control, adjustment of the mA and/or kV according to patient size and/or use of iterative reconstruction technique. COMPARISON:  CT examination dated January 28, 2023 FINDINGS: CT CHEST FINDINGS Cardiovascular: Normal heart size. No pericardial effusion. Coronary artery bypass grafting. Mild aortic atherosclerotic calcifications. Mediastinum/Nodes: No enlarged mediastinal, hilar, or axillary lymph nodes. Thyroid gland, trachea, and esophagus demonstrate no significant findings. Lungs/Pleura: Mild bronchial thickening and tree-in-bud opacities in the right lower lobe concerning for infectious/inflammatory process (series 3, image 40). No pleural effusion or pneumothorax. Musculoskeletal: No chest wall mass or suspicious bone lesions identified. Sternotomy wires are intact. Diffuse idiopathic skeletal hyperostosis. CT ABDOMEN PELVIS FINDINGS Hepatobiliary: No focal liver abnormality is seen. No gallstones, gallbladder wall thickening, or biliary dilatation. Pancreas: Unremarkable. No pancreatic ductal dilatation or surrounding inflammatory changes. Spleen: Normal in size without focal abnormality. Adrenals/Urinary Tract: Adrenal glands are unremarkable. Kidneys are normal,  without renal calculi, focal lesion, or hydronephrosis. Bladder is unremarkable. Stomach/Bowel: Stomach is within normal limits. Appendix not identified. No evidence of bowel wall thickening, distention, or inflammatory changes. Vascular/Lymphatic: Mild aortic atherosclerosis. No enlarged abdominal or pelvic lymph nodes. Reproductive: Postsurgical changes for prior TURP. Other: No abdominal wall hernia or abnormality. No abdominopelvic ascites. Musculoskeletal: No acute or significant osseous findings. Postsurgical changes at L5-S1 spinous processes. IMPRESSION: CT chest: 1. Mild bronchial  thickening and tree-in-bud opacities in the right lower lobe concerning for infectious/inflammatory process. Clinical correlation and follow-up examination to resolution is recommended. 2. Evidence of prior coronary artery bypass grafting. CT abdomen/pelvis: 1. No CT evidence of acute abdominal/pelvic process. 2. Aortic atherosclerosis. 3. Postsurgical changes of prior TURP Aortic Atherosclerosis (ICD10-I70.0). Electronically Signed   By: Larose Hires D.O.   On: 03/07/2023 12:52   CT Head Wo Contrast  Result Date: 03/07/2023 CLINICAL DATA:  Altered mental status. Pain. EXAM: CT HEAD WITHOUT CONTRAST CT CERVICAL SPINE WITHOUT CONTRAST TECHNIQUE: Multidetector CT imaging of the head and cervical spine was performed following the standard protocol without intravenous contrast. Multiplanar CT image reconstructions of the cervical spine were also generated. RADIATION DOSE REDUCTION: This exam was performed according to the departmental dose-optimization program which includes automated exposure control, adjustment of the mA and/or kV according to patient size and/or use of iterative reconstruction technique. COMPARISON:  CT head and cervical spine 11/12/2022. FINDINGS: CT HEAD FINDINGS Brain: No acute hemorrhage. Unchanged moderate to severe chronic small-vessel disease. Cortical gray-white differentiation is otherwise preserved.  Prominence of the ventricles and sulci within expected range for age. No hydrocephalus or extra-axial collection. No mass effect or midline shift. 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 Vascular: No hyperdense vessel or unexpected calcification. Skull: No calvarial fracture or suspicious bone lesion. Skull base is unremarkable. Sinuses/Orbits: Unremarkable. Other: None. CT CERVICAL SPINE FINDINGS Alignment: Normal. Skull base and vertebrae: No acute fracture. Stable postoperative changes of C3-C7 ACDF. Hardware is intact. No associated lucency. Soft tissues and spinal canal: No prevertebral fluid or swelling. No visible canal hematoma. Disc levels:  No high-grade spinal canal stenosis. Upper chest: Unremarkable. Other: Atherosclerotic calcifications of the carotid bulbs. IMPRESSION: 1. No acute intracranial abnormality. Unchanged moderate to severe chronic small-vessel disease. 2. No acute fracture or traumatic listhesis of the cervical spine. Stable postoperative changes of C3-C7 ACDF. Electronically Signed   By: Orvan Falconer M.D.   On: 03/07/2023 12:30   CT Cervical Spine Wo Contrast  Result Date: 03/07/2023 CLINICAL DATA:  Altered mental status. Pain. EXAM: CT HEAD WITHOUT CONTRAST CT CERVICAL SPINE WITHOUT CONTRAST TECHNIQUE: Multidetector CT imaging of the head and cervical spine was performed following the standard protocol without intravenous contrast. Multiplanar CT image reconstructions of the cervical spine were also generated. RADIATION DOSE REDUCTION: This exam was performed according to the departmental dose-optimization program which includes automated exposure control, adjustment of the mA and/or kV according to patient size and/or use of iterative reconstruction technique. COMPARISON:  CT head and cervical spine 11/12/2022. FINDINGS: CT HEAD FINDINGS Brain: No acute hemorrhage. Unchanged moderate to severe chronic small-vessel disease. Cortical gray-white differentiation is  otherwise preserved. Prominence of the ventricles and sulci within expected range for age. No hydrocephalus or extra-axial collection. No mass effect or midline shift. 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 Vascular: No hyperdense vessel or unexpected calcification. Skull: No calvarial fracture or suspicious bone lesion. Skull base is unremarkable. Sinuses/Orbits: Unremarkable. Other: None. CT CERVICAL SPINE FINDINGS Alignment: Normal. Skull base and vertebrae: No acute fracture. Stable postoperative changes of C3-C7 ACDF. Hardware is intact. No associated lucency. Soft tissues and spinal canal: No prevertebral fluid or swelling. No visible canal hematoma. Disc levels:  No high-grade spinal canal stenosis. Upper chest: Unremarkable. Other: Atherosclerotic calcifications of the carotid bulbs. IMPRESSION: 1. No acute intracranial abnormality. Unchanged moderate to severe chronic small-vessel disease. 2. No acute fracture or traumatic listhesis of the cervical spine. Stable  postoperative changes of C3-C7 ACDF. Electronically Signed   By: Orvan Falconer M.D.   On: 03/07/2023 12:30   DG Chest 2 View  Result Date: 03/05/2023 CLINICAL DATA:  Cough EXAM: CHEST - 2 VIEW COMPARISON:  02/14/2023 FINDINGS: Cardiac shadow is stable. Postsurgical changes are again noted and stable. Lungs are well aerated bilaterally. No focal infiltrate or sizable effusion is seen. Coronary stents are noted. No acute bony abnormality is noted. IMPRESSION: No active cardiopulmonary disease. Electronically Signed   By: Alcide Clever M.D.   On: 03/05/2023 21:12    Micro Results    No results found for this or any previous visit (from the past 240 hour(s)).  Today   Subjective    Michael [name] today has no headache,no chest abdominal pain,no new weakness tingling or numbness, feels much better wants to go to his brothers home today.    Objective   Blood pressure (!) 146/81, pulse 80, temperature 98.9 F (37.2 C),  temperature source Oral, resp. rate 16, height 5\' 8"  (1.727 m), weight 71 kg, SpO2 95 %.   Intake/Output Summary (Last 24 hours) at 03/24/2023 0856 Last data filed at 03/24/2023 0502 Gross per 24 hour  Intake --  Output 1600 ml  Net -1600 ml    Exam  Awake Alert, No new F.N deficits,    [place].AT,PERRAL Supple Neck,   Symmetrical Chest wall movement, Good air movement bilaterally, CTAB RRR,No Gallops,   +ve B.Sounds, Abd Soft, Non tender,  No Cyanosis, Clubbing or edema    Data Review   Recent Labs  Lab 03/19/23 0942 03/20/23 0549 03/23/23 0330  WBC 4.8 3.6* 3.9*  HGB 9.4* 9.6* 8.7*  HCT 30.1* 29.8* 27.7*  PLT 159 149* 125*  MCV 91.2 91.4 90.5  MCH 28.5 29.4 28.4  MCHC 31.2 32.2 31.4  RDW 15.8* 15.7* 15.4  LYMPHSABS 0.9 0.7 1.0  MONOABS 0.4 0.4 0.5  EOSABS 0.1 0.1 0.1  BASOSABS 0.0 0.0 0.0    Recent Labs  Lab 03/20/23 0549 03/23/23 0330 03/23/23 1250  NA 139 134*  --   K 3.8 4.4  --   CL 104 105  --   CO2 23 22  --   ANIONGAP 12 7  --   GLUCOSE 112* 125*  --   BUN 39* 44*  --   CREATININE 4.34* 4.23*  --   AST 24 24  --   ALT 27 24  --   ALKPHOS 73 66  --   BILITOT 0.8 0.7  --   ALBUMIN 2.7* 2.7*  --   MG  --  1.5* 2.6*  CALCIUM 8.3* 7.7*  --     Total Time in preparing paper work, data evaluation and todays exam - 35 minutes  Signature  -    Susa Raring M.D on 03/24/2023 at 8:56 AM   -  To page go to [email or web address]

## 2023-03-29 ENCOUNTER — Other Ambulatory Visit (HOSPITAL_COMMUNITY): Payer: Self-pay

## 2023-04-15 ENCOUNTER — Ambulatory Visit: Payer: 59 | Admitting: Student

## 2023-04-22 ENCOUNTER — Inpatient Hospital Stay: Payer: 59 | Admitting: Internal Medicine

## 2023-05-08 NOTE — Progress Notes (Signed)
Warm transfer given to RN [name].

## 2023-06-18 ENCOUNTER — Encounter: Payer: Self-pay | Admitting: *Deleted

## 2023-06-18 NOTE — Progress Notes (Signed)
Duplicate documentation in error. See other 06/18/23 f/u notes. [name], RN, 12:42PM. 06/18/2023

## 2023-06-18 NOTE — Progress Notes (Signed)
Pt attended 02/04/23 screening event where his b/p was 130/73 and his blood sugar was 137. Event nurse entered pt's [facility] PCP of record as his PCP on the event screening form and pt did identify multiple SDOH insecurities for which he was given resources at that time. During the initial event f/u, chart review indicated pt had future appt with [facility] and another provider and additional resources were mailed to him by the health equity team member. During a 60 day follow up, chart review indicates pt has been receiving home health and "Hospital at home" care through [facility] in June and July, and was eventually hospitalized at [facility] on 06/11/23 for acute renal failure. Current documentation indicates pt has been place on dialysis, is still hospitalized, and has a care team supporting d/c planning. Note from [facility] RN Care Manager Wyatt Haste, RN, on 06/18/23 notes that pt placement is being planned at [facility] and dialysis scheduling is being addressed. Other notes from [facility] include:"[facility] is the ONLY bed offer. If they are not able to accommodate this chair time, I will need to investigate a [facility] dialysis center. The patient will need to change his Medicare advantage plan. I will also need to find a [facility] Nephrologist." [place] equity team support not currently needed.

## 2023-12-31 ENCOUNTER — Emergency Department (HOSPITAL_COMMUNITY)
Admission: EM | Admit: 2023-12-31 | Discharge: 2023-12-31 | Disposition: A | Discharge status: Skilled Nursing Facility | Attending: Emergency Medicine | Admitting: Emergency Medicine

## 2023-12-31 ENCOUNTER — Emergency Department (HOSPITAL_COMMUNITY)

## 2023-12-31 ENCOUNTER — Encounter (HOSPITAL_COMMUNITY): Payer: Self-pay

## 2023-12-31 ENCOUNTER — Other Ambulatory Visit: Payer: Self-pay

## 2023-12-31 DIAGNOSIS — R4701 Aphasia: Secondary | ICD-10-CM | POA: Diagnosis not present

## 2023-12-31 DIAGNOSIS — R29707 NIHSS score 7: Secondary | ICD-10-CM

## 2023-12-31 DIAGNOSIS — Z7901 Long term (current) use of anticoagulants: Secondary | ICD-10-CM | POA: Insufficient documentation

## 2023-12-31 DIAGNOSIS — R29810 Facial weakness: Secondary | ICD-10-CM | POA: Diagnosis not present

## 2023-12-31 DIAGNOSIS — Z21 Asymptomatic human immunodeficiency virus [HIV] infection status: Secondary | ICD-10-CM | POA: Diagnosis not present

## 2023-12-31 DIAGNOSIS — E1122 Type 2 diabetes mellitus with diabetic chronic kidney disease: Secondary | ICD-10-CM | POA: Insufficient documentation

## 2023-12-31 DIAGNOSIS — J45909 Unspecified asthma, uncomplicated: Secondary | ICD-10-CM | POA: Diagnosis not present

## 2023-12-31 DIAGNOSIS — I120 Hypertensive chronic kidney disease with stage 5 chronic kidney disease or end stage renal disease: Secondary | ICD-10-CM | POA: Diagnosis not present

## 2023-12-31 DIAGNOSIS — Z7951 Long term (current) use of inhaled steroids: Secondary | ICD-10-CM | POA: Diagnosis not present

## 2023-12-31 DIAGNOSIS — R531 Weakness: Secondary | ICD-10-CM | POA: Insufficient documentation

## 2023-12-31 DIAGNOSIS — Z79899 Other long term (current) drug therapy: Secondary | ICD-10-CM | POA: Insufficient documentation

## 2023-12-31 DIAGNOSIS — I6782 Cerebral ischemia: Secondary | ICD-10-CM | POA: Insufficient documentation

## 2023-12-31 DIAGNOSIS — Z7982 Long term (current) use of aspirin: Secondary | ICD-10-CM | POA: Insufficient documentation

## 2023-12-31 DIAGNOSIS — N186 End stage renal disease: Secondary | ICD-10-CM | POA: Insufficient documentation

## 2023-12-31 DIAGNOSIS — Z992 Dependence on renal dialysis: Secondary | ICD-10-CM | POA: Insufficient documentation

## 2023-12-31 LAB — DIFFERENTIAL
Abs Immature Granulocytes: 0.12 10*3/uL — ABNORMAL HIGH (ref 0.00–0.07)
Basophils Absolute: 0 10*3/uL (ref 0.0–0.1)
Basophils Relative: 1 %
Eosinophils Absolute: 0.1 10*3/uL (ref 0.0–0.5)
Eosinophils Relative: 1 %
Immature Granulocytes: 2 %
Lymphocytes Relative: 16 %
Lymphs Abs: 1 10*3/uL (ref 0.7–4.0)
Monocytes Absolute: 0.4 10*3/uL (ref 0.1–1.0)
Monocytes Relative: 7 %
Neutro Abs: 4.7 10*3/uL (ref 1.7–7.7)
Neutrophils Relative %: 73 %

## 2023-12-31 LAB — CBG MONITORING, ED: Glucose-Capillary: 100 mg/dL — ABNORMAL HIGH (ref 70–99)

## 2023-12-31 LAB — PROTIME-INR
INR: 1.1 (ref 0.8–1.2)
Prothrombin Time: 14 s (ref 11.4–15.2)

## 2023-12-31 LAB — COMPREHENSIVE METABOLIC PANEL
ALT: 14 U/L (ref 0–44)
AST: 18 U/L (ref 15–41)
Albumin: 3.8 g/dL (ref 3.5–5.0)
Alkaline Phosphatase: 69 U/L (ref 38–126)
Anion gap: 12 (ref 5–15)
BUN: 17 mg/dL (ref 8–23)
CO2: 31 mmol/L (ref 22–32)
Calcium: 8.6 mg/dL — ABNORMAL LOW (ref 8.9–10.3)
Chloride: 95 mmol/L — ABNORMAL LOW (ref 98–111)
Creatinine, Ser: 4.47 mg/dL — ABNORMAL HIGH (ref 0.61–1.24)
GFR, Estimated: 13 mL/min — ABNORMAL LOW (ref >60)
Glucose, Bld: 132 mg/dL — ABNORMAL HIGH (ref 70–99)
Potassium: 3.4 mmol/L — ABNORMAL LOW (ref 3.5–5.1)
Sodium: 138 mmol/L (ref 135–145)
Total Bilirubin: 0.4 mg/dL (ref 0.0–1.2)
Total Protein: 7.8 g/dL (ref 6.5–8.1)

## 2023-12-31 LAB — CBC
HCT: 30.2 % — ABNORMAL LOW (ref 39.0–52.0)
Hemoglobin: 10.2 g/dL — ABNORMAL LOW (ref 13.0–17.0)
MCH: 33 pg (ref 26.0–34.0)
MCHC: 33.8 g/dL (ref 30.0–36.0)
MCV: 97.7 fL (ref 80.0–100.0)
Platelets: 135 10*3/uL — ABNORMAL LOW (ref 150–400)
RBC: 3.09 MIL/uL — ABNORMAL LOW (ref 4.22–5.81)
RDW: 14.3 % (ref 11.5–15.5)
WBC: 6.3 10*3/uL (ref 4.0–10.5)
nRBC: 0 % (ref 0.0–0.2)

## 2023-12-31 LAB — POC OCCULT BLOOD, ED: Fecal Occult Bld: NEGATIVE

## 2023-12-31 LAB — I-STAT CHEM 8, ED
BUN: 18 mg/dL (ref 8–23)
Calcium, Ion: 0.92 mmol/L — ABNORMAL LOW (ref 1.15–1.40)
Chloride: 95 mmol/L — ABNORMAL LOW (ref 98–111)
Creatinine, Ser: 4.8 mg/dL — ABNORMAL HIGH (ref 0.61–1.24)
Glucose, Bld: 126 mg/dL — ABNORMAL HIGH (ref 70–99)
HCT: 33 % — ABNORMAL LOW (ref 39.0–52.0)
Hemoglobin: 11.2 g/dL — ABNORMAL LOW (ref 13.0–17.0)
Potassium: 3.4 mmol/L — ABNORMAL LOW (ref 3.5–5.1)
Sodium: 138 mmol/L (ref 135–145)
TCO2: 31 mmol/L (ref 22–32)

## 2023-12-31 LAB — ETHANOL: Alcohol, Ethyl (B): <10 mg/dL (ref <10)

## 2023-12-31 LAB — APTT: aPTT: 32 s (ref 24–36)

## 2023-12-31 NOTE — ED Notes (Signed)
 Called and placed monitor with CCMD

## 2023-12-31 NOTE — Code Documentation (Addendum)
 Stroke Response Nurse Documentation Code Documentation  Michael [name] is a 73 y.o. male arriving to [facility]  via [facility] on 12/31/2023 with past medical hx of CKD on dialysis, CAD, GERD HIV DM PE cardiomyopathy. On Aspirin 81 mg. Code stroke was activated by EMS.   Patient from dialysis  where he was LKW at 1500 and now complaining of aphasia and blurry vision and left sided weakness. Initially last known well thought to be today at 1500, but upon further questioning, he has had symptoms for several days.   Stroke team at the bedside on patient arrival. Labs drawn and patient cleared for CT by EDP. Patient to CT with team. NIHSS 7, see documentation for details and code stroke times. Patient with disoriented, left arm weakness, and left leg weakness, dysarthria on exam. (No aphasia). The following imaging was completed:  CT Head. Patient is not a candidate for IV Thrombolytic due to outside of the treatment window.. Patient is not a candidate for IR due to LVO not suspected.   Care Plan: NIHSS and VS q 2 x 12 hrs,                    Then q 4.   NPO until stroke swallow screen completed   Bedside handoff with ED RN Katha Cabal, Lacey Jensen  Stroke Response RN

## 2023-12-31 NOTE — ED Notes (Signed)
 Phlebotomy and RN unable to obtain labs , IV team consult put in. PT also stated that he has always been a hard stick and needs the "machine".

## 2023-12-31 NOTE — ED Notes (Signed)
 PT on the phone with his sister

## 2023-12-31 NOTE — ED Notes (Signed)
 Patient transported to MRI

## 2023-12-31 NOTE — Discharge Instructions (Addendum)
 You need to talk to your PCP or your cardiologist to determine if you need to go back on Eliquis.

## 2023-12-31 NOTE — ED Triage Notes (Signed)
 PT BIB [facility] from a dialysis appt where PT received full dialysis treatment.Staff reported PT as being aphasic and having some left sided weakness. The left sided weakness was baseline from a previous incident. PT reported to EMS that his LKW was last Monday but he was afriad to tell someone because he did not want to miss dialysis.PT is alert and oriented.

## 2023-12-31 NOTE — Consult Note (Signed)
 NEUROLOGY CONSULT NOTE   Date of service: December 31, 2023 Patient Name: Michael [name] MRN:  [id number] DOB:  Nov 06, 1950 Chief Complaint: "Code Stroke" Requesting Provider: No att. providers found  History of Present Illness  Michael [name] is a 73 y.o. male with hx of afib not on anticoagulation, HTN, HLD, MI, CAD, CHF, ESRD on HD MWF, HIV, depression, gout and DM  presenting with aphasia and left sided weakness. He currently resides at [facility] and was sent to dialysis this morning. Staff at dialysis noted aphasia and left facial droop and called EMS.  On arrival patient tells [place] he started noticing these symptoms last week but was scared to come to the ED and miss dialysis. Additionally he has had frequent presentations to the ED post HD for weakness. In chart review it does not appear that he has had previous neurologic events, but he does have chronic back and neck pain resulting in numbness in the left fingers since 1987 and lower back pain resulting in numbness in the left toes. He currently follows with [facility].   He has chronic diminished sensation on the left.   LKW: Unclear last night known well Modified rankin score: 2-Slight disability-UNABLE to perform all activities but does not need assistance IV Thrombolysis: No, outside of the window EVT: No, not consistent with LVO Delay in labs due to very difficult stick  NIHSS components Score: Comment  1a Level of Conscious 0[x]  1[]  2[]  3[]      1b LOC Questions 0[]  1[x]  2[]       1c LOC Commands 0[x]  1[]  2[]       2 Best Gaze 0[x]  1[]  2[]       3 Visual 0[x]  1[]  2[]  3[]      4 Facial Palsy 0[]  1[x]  2[]  3[]      5a Motor Arm - left 0[]  1[x]  2[]  3[]  4[]  UN[]    5b Motor Arm - Right 0[x]  1[]  2[]  3[]  4[]  UN[]    6a Motor Leg - Left 0[]  1[]  2[x]  3[]  4[]  UN[]    6b Motor Leg - Right 0[x]  1[]  2[]  3[]  4[]  UN[]    7 Limb Ataxia 0[x]  1[]  2[]  3[]  UN[]     8 Sensory 0[]  1[x]  2[]  UN[]      9 Best Language 0[x]  1[]  2[]  3[]       10 Dysarthria 0[]  1[x]  2[]  UN[]      11 Extinct. and Inattention 0[x]  1[]  2[]       TOTAL: 7      ROS  Comprehensive ROS performed and pertinent positives documented in HPI   Past History   Past Medical History:  Diagnosis Date   A-fib ([name])    Anemia    Asthma    No PFTs, history of childhood asthma   CAD (coronary artery disease)    Cellulitis 04/2014   left facial   Cellulitis and abscess of toe of right foot 12/08/2019   Chondromalacia of medial femoral condyle    Left knee MRI 04/28/12: Chondromalacia of the medial femoral condyle with slight peripheral degeneration of the meniscocapsular junction of the medial meniscus; followed by sports medicine   Chronic kidney failure, stage 4 (severe) ([name])    Collagen vascular disease ([name])    Crack cocaine use    for 20+ years, has been enrolled in detox programs in the past   Depression    with history of hospitalization for suicidal ideation   Diabetes mellitus 2002   Diagnosed in 2002, started insulin in 2012   Gout  Headache(784.0)    CT head 08/2011: Periventricular and subcortical white matter hypodensities are most in keeping with chronic microangiopathic change   HIV infection ([name]) 08/2011   Followed by Dr. Ninetta Lights   Hyperlipidemia    Hypertension    Pulmonary embolism [facility])     Past Surgical History:  Procedure Laterality Date   AMPUTATION Right 07/21/2019   Procedure: RIGHT SECOND TOE AMPUTATION;  Surgeon: Nadara Mustard, MD;  Location: [facility] OR;  Service: Orthopedics;  Laterality: Right;   BACK SURGERY     1988   BIOPSY  03/20/2023   Procedure: BIOPSY;  Surgeon: Shellia Cleverly, DO;  Location: [facility] ENDOSCOPY;  Service: Gastroenterology;;   BOWEL RESECTION     CARDIAC SURGERY     CERVICAL SPINE SURGERY     " rods in my neck "   COLONOSCOPY WITH PROPOFOL N/A 03/20/2023   Procedure: COLONOSCOPY WITH PROPOFOL;  Surgeon: Shellia Cleverly, DO;  Location: [facility];  Service: Gastroenterology;  Laterality:  N/A;   CORONARY ARTERY BYPASS GRAFT     CORONARY STENT PLACEMENT     ESOPHAGOGASTRODUODENOSCOPY (EGD) WITH PROPOFOL N/A 03/20/2023   Procedure: ESOPHAGOGASTRODUODENOSCOPY (EGD) WITH PROPOFOL;  Surgeon: Shellia Cleverly, DO;  Location: [facility]COPY;  Service: Gastroenterology;  Laterality: N/A;   NM MYOCAR PERF WALL MOTION  12/27/2011   normal   POLYPECTOMY  03/20/2023   Procedure: POLYPECTOMY;  Surgeon: Shellia Cleverly, DO;  Location: [facility];  Service: Gastroenterology;;   SPINE SURGERY      Family History: Family History  Problem Relation Age of Onset   Diabetes Mother    Hypertension Mother    Hyperlipidemia Mother    Diabetes Father    Cancer Father    Hypertension Father    Diabetes Brother    Heart disease Brother    Diabetes Sister    Colon cancer Neg Hx     Social History  reports that he has never smoked. He has never used smokeless tobacco. He reports that he does not currently use drugs after having used the following drugs: "Crack" cocaine and Cocaine. Frequency: 4.00 times per week. He reports that he does not drink alcohol.  No Known Allergies  Medications  No current facility-administered medications for this encounter.  Current Outpatient Medications:    albuterol (VENTOLIN HFA) 108 (90 Base) MCG/ACT inhaler, Inhale 1-2 puffs into the lungs every 6 (six) hours as needed for wheezing or shortness of breath., Disp: 6.7 g, Rfl: 0   aspirin 81 MG chewable tablet, Chew 1 tablet (81 mg total) by mouth daily., Disp: 30 tablet, Rfl: 0   atorvastatin (LIPITOR) 40 MG tablet, Take 1 tablet (40 mg total) by mouth daily., Disp: 30 tablet, Rfl: 0   bictegravir-emtricitabine-tenofovir AF (BIKTARVY) 50-200-25 MG TABS tablet, Take 1 tablet by mouth daily., Disp: 30 tablet, Rfl: 2   diltiazem (CARDIZEM CD) 180 MG 24 hr capsule, Take 1 capsule (180 mg total) by mouth daily., Disp: 30 capsule, Rfl: 0   DULoxetine (CYMBALTA) 30 MG capsule, Take 1 capsule (30 mg total) by  mouth daily., Disp: 30 capsule, Rfl: 0   fluticasone furoate-vilanterol (BREO ELLIPTA) 200-25 MCG/ACT AEPB, Inhale 1 puff into the lungs daily., Disp: 60 each, Rfl: 0   linaclotide (LINZESS) 290 MCG CAPS capsule, Take 1 capsule (290 mcg total) by mouth daily before breakfast., Disp: 30 capsule, Rfl: 0   nitroGLYCERIN (NITROSTAT) 0.4 MG SL tablet, Place 1 tablet (0.4 mg total) under the tongue every 5 (five)  minutes as needed for chest pain., Disp: 25 tablet, Rfl: 0   pantoprazole (PROTONIX) 40 MG tablet, Take 1 tablet (40 mg total) by mouth daily at 12 noon., Disp: 30 tablet, Rfl: 0   polyethylene glycol powder (GLYCOLAX/MIRALAX) 17 GM/SCOOP powder, Take 17g (1 capful) by mouth daily., Disp: 476 g, Rfl: 0   senna-docusate (SENOKOT-S) 8.6-50 MG tablet, Take 2 tablets by mouth at bedtime as needed for mild constipation., Disp: 30 tablet, Rfl: 0   sorbitol 70 % SOLN, Take 30 mLs by mouth as needed for moderate constipation., Disp: 473 mL, Rfl: 0  Vitals  There were no vitals filed for this visit.  There is no height or weight on file to calculate BMI.  Physical Exam   Constitutional: Thin, chronically ill appearing Psych: Anxious, tearful Eyes: No scleral injection.  HENT: No OP obstruction.  Head: Normocephalic.  Cardiovascular: Normal rate and regular rhythm.  Respiratory: Effort normal, non-labored breathing.  GI: Soft.  No distension. There is no tenderness.  Skin: WDI.   Neurologic Examination   Neuro: Mental Status: Patient is awake, alert, oriented to person, place, and situation. Unable to state month Speech is dysarthric, able to give a coherent history, no aphasia appreciated Cranial Nerves: II: Visual Fields are full. Pupils are equal, round, and reactive to light.   III,IV, VI: EOMI without ptosis or diploplia.  V: Facial sensation is symmetric to temperature VII: Left facial droop  VIII: Hearing is intact to voice X: Palate elevates symmetrically XI: Shoulder shrug is  symmetric. XII: Tongue protrudes midline without atrophy or fasciculations.  Motor: Tone is normal. Bulk is normal.  LUE  4/5 RUE 5/5 LLE 3/5 RLE 4/5 Sensory: Sensation diminished on the left (chronic) Cerebellar: FNF and HKS are intact bilaterally.    Labs/Imaging/Neurodiagnostic studies   CBC: No results for input(s): "WBC", "NEUTROABS", "HGB", "HCT", "MCV", "PLT" in the last 168 hours. Basic Metabolic Panel:  Lab Results  Component Value Date   NA 134 (L) 03/23/2023   K 4.4 03/23/2023   CO2 22 03/23/2023   GLUCOSE 125 (H) 03/23/2023   BUN 44 (H) 03/23/2023   CREATININE 4.23 (H) 03/23/2023   CALCIUM 7.7 (L) 03/23/2023   GFRNONAA 14 (L) 03/23/2023   GFRAA 33 (L) 12/12/2019   Lipid Panel:  Lab Results  Component Value Date   LDLCALC 68 04/25/2021   HgbA1c:  Lab Results  Component Value Date   HGBA1C 6.8 (H) 09/30/2022   Urine Drug Screen:     Component Value Date/Time   LABOPIA NONE DETECTED 03/07/2023 1250   COCAINSCRNUR POSITIVE (A) 03/07/2023 1250   COCAINSCRNUR POS (A) 01/08/2016 1037   LABBENZ NONE DETECTED 03/07/2023 1250   LABBENZ NEG 01/08/2016 1037   AMPHETMU NONE DETECTED 03/07/2023 1250   THCU NONE DETECTED 03/07/2023 1250   LABBARB NONE DETECTED 03/07/2023 1250    Alcohol Level     Component Value Date/Time   ETH <10 03/07/2023 1050   INR  Lab Results  Component Value Date   INR 0.9 11/20/2021   APTT  Lab Results  Component Value Date   APTT 27 04/03/2021   AED levels: No results found for: "PHENYTOIN", "ZONISAMIDE", "LAMOTRIGINE", "LEVETIRACETA"  CT Head without contrast(Personally reviewed): 1. No evidence of acute intracranial abnormality. ASPECTS of 10. 2. Moderately extensive chronic small vessel ischemic disease.   ASSESSMENT   Michael [name] is a 73 y.o. male presenting for dialysis as a code stroke for aphasia, left facial droop, and left sided weakness.  On arrival to ED, patient states he first noticed the facial  asymmetry and difficulty with speech last week. Plan for MRI brain wo and MRA Head.   RECOMMENDATIONS  - MRI Brain wo and MRA head   - Stroke work up if positive (carotid duplex/ECHO/A1c/Lipid/PT/OT/SLP); inpatient neurology will sign off if negative for acute process - Follow up with PCP at discharge to discuss risk/benefit of anticoagulation given high CHA2DS2-VASc score but chart reports of significant bleeds - Orthostatic vitals  - Metabolic/additional medical workup per EDP ______________________________________________________________________    Signed, Elmer Picker, NP [facility]  Attending Neurologist's note:  I personally saw this patient, gathering history, performing a full neurologic examination, reviewing relevant labs, personally reviewing relevant imaging including head CT, MRI brain (no stroke on brief review, radiology report pending), and formulated the assessment and plan, adding the note above for completeness and clarity to accurately reflect my thoughts  Brooke Dare MD-PhD [facility] [phone] Available 7 AM to 7 PM, outside these hours please contact Neurologist on call listed on AMION

## 2023-12-31 NOTE — ED Provider Notes (Signed)
 [place] EMERGENCY DEPARTMENT AT [facility] Provider Note   CSN: [id number] Arrival date & time: 12/31/23  1624  An emergency department physician performed an initial assessment on this suspected stroke patient at 1625.  History  Chief Complaint  Patient presents with   Code Stroke    Michael [name] is a 73 y.o. male.  Pt is a 73 yo male with pmhx significant for ESRD on HD (MWF), DM, HTN, gout, hld, HIV, hx crack use, asthma, depression, afib, pe, and anemia.  Pt was at dialysis today and said he had left sided weakness.  Per old chart, pt has had left sided weakness for a long time.  Pt was on Eliquis, but it's unclear why he was taken off.  Code stroke called by EMS.  Pt met at the bridge by myself and the stroke team.       Home Medications Prior to Admission medications   Medication Sig Start Date End Date Taking? Authorizing Provider  albuterol (VENTOLIN HFA) 108 (90 Base) MCG/ACT inhaler Inhale 1-2 puffs into the lungs every 6 (six) hours as needed for wheezing or shortness of breath. 02/19/23   Azucena Fallen, MD  aspirin 81 MG chewable tablet Chew 1 tablet (81 mg total) by mouth daily. 02/19/23   Azucena Fallen, MD  atorvastatin (LIPITOR) 40 MG tablet Take 1 tablet (40 mg total) by mouth daily. 02/19/23 06/19/23  Azucena Fallen, MD  bictegravir-emtricitabine-tenofovir AF (BIKTARVY) 50-200-25 MG TABS tablet Take 1 tablet by mouth daily. 03/14/23   Judyann Munson, MD  diltiazem (CARDIZEM CD) 180 MG 24 hr capsule Take 1 capsule (180 mg total) by mouth daily. 02/19/23 03/21/23  Azucena Fallen, MD  DULoxetine (CYMBALTA) 30 MG capsule Take 1 capsule (30 mg total) by mouth daily. 02/19/23   Azucena Fallen, MD  fluticasone furoate-vilanterol (BREO ELLIPTA) 200-25 MCG/ACT AEPB Inhale 1 puff into the lungs daily. 02/19/23   Azucena Fallen, MD  linaclotide [facility]) 290 MCG CAPS capsule Take 1 capsule (290 mcg total) by mouth daily before  breakfast. 03/24/23   Leroy Sea, MD  nitroGLYCERIN (NITROSTAT) 0.4 MG SL tablet Place 1 tablet (0.4 mg total) under the tongue every 5 (five) minutes as needed for chest pain. 02/19/23   Azucena Fallen, MD  pantoprazole (PROTONIX) 40 MG tablet Take 1 tablet (40 mg total) by mouth daily at 12 noon. 03/24/23   Leroy Sea, MD  polyethylene glycol powder (GLYCOLAX/MIRALAX) 17 GM/SCOOP powder Take 17g (1 capful) by mouth daily. 03/24/23   Leroy Sea, MD  senna-docusate (SENOKOT-S) 8.6-50 MG tablet Take 2 tablets by mouth at bedtime as needed for mild constipation. 03/24/23   Leroy Sea, MD  sorbitol 70 % SOLN Take 30 mLs by mouth as needed for moderate constipation. 03/24/23   Leroy Sea, MD      Allergies    Patient has no known allergies.    Review of Systems   Review of Systems  Neurological:  Positive for weakness.  All other systems reviewed and are negative.   Physical Exam Updated Vital Signs BP 114/70   Pulse 100   Temp 97.8 F (36.6 C) (Oral)   Resp 19   Ht 5\' 8"  (1.727 m)   Wt 66.6 kg   SpO2 100%   BMI 22.32 kg/m  Physical Exam Vitals and nursing note reviewed.  Constitutional:      Appearance: Normal appearance.  HENT:     Head:  Normocephalic and atraumatic.     Right Ear: External ear normal.     Left Ear: External ear normal.     Nose: Nose normal.     Mouth/Throat:     Mouth: Mucous membranes are moist.     Pharynx: Oropharynx is clear.  Eyes:     Extraocular Movements: Extraocular movements intact.     Conjunctiva/sclera: Conjunctivae normal.     Pupils: Pupils are equal, round, and reactive to light.  Cardiovascular:     Rate and Rhythm: Normal rate. Rhythm irregular.     Pulses: Normal pulses.     Heart sounds: Normal heart sounds.  Pulmonary:     Effort: Pulmonary effort is normal.     Breath sounds: Normal breath sounds.  Abdominal:     General: Abdomen is flat. Bowel sounds are normal.     Palpations: Abdomen is  soft.  Musculoskeletal:     Cervical back: Normal range of motion and neck supple.     Comments: Left arm AVF with thrill  Skin:    General: Skin is warm.     Capillary Refill: Capillary refill takes less than 2 seconds.  Neurological:     Mental Status: He is alert and oriented to person, place, and time.     Comments: Left sided weakness (chronic)     ED Results / Procedures / Treatments   Labs (all labs ordered are listed, but only abnormal results are displayed) Labs Reviewed  CBC - Abnormal; Notable for the following components:      Result Value   RBC 3.09 (*)    Hemoglobin 10.2 (*)    HCT 30.2 (*)    Platelets 135 (*)    All other components within normal limits  DIFFERENTIAL - Abnormal; Notable for the following components:   Abs Immature Granulocytes 0.12 (*)    All other components within normal limits  COMPREHENSIVE METABOLIC PANEL - Abnormal; Notable for the following components:   Potassium 3.4 (*)    Chloride 95 (*)    Glucose, Bld 132 (*)    Creatinine, Ser 4.47 (*)    Calcium 8.6 (*)    GFR, Estimated 13 (*)    All other components within normal limits  I-STAT CHEM 8, ED - Abnormal; Notable for the following components:   Potassium 3.4 (*)    Chloride 95 (*)    Creatinine, Ser 4.80 (*)    Glucose, Bld 126 (*)    Calcium, Ion 0.92 (*)    Hemoglobin 11.2 (*)    HCT 33.0 (*)    All other components within normal limits  CBG MONITORING, ED - Abnormal; Notable for the following components:   Glucose-Capillary 100 (*)    All other components within normal limits  PROTIME-INR  APTT  ETHANOL  URINALYSIS, ROUTINE W REFLEX MICROSCOPIC  RAPID URINE DRUG SCREEN, HOSP PERFORMED  POC OCCULT BLOOD, ED    EKG EKG Interpretation Date/Time:  Wednesday December 31 2023 18:09:07 EDT Ventricular Rate:  88 PR Interval:  110 QRS Duration:  98 QT Interval:  408 QTC Calculation: 494 R Axis:   74  Text Interpretation: Ectopic atrial rhythm Borderline short PR  interval Probable LVH with secondary repol abnrm Borderline prolonged QT interval No significant change since last tracing Confirmed by Jacalyn Lefevre [phone]) on 12/31/2023 6:18:00 PM  Radiology MR BRAIN WO CONTRAST Result Date: 12/31/2023 CLINICAL DATA:  Stroke follow-up EXAM: MRI HEAD WITHOUT CONTRAST MRA HEAD WITHOUT CONTRAST TECHNIQUE: Multiplanar, multi-echo pulse sequences  of the brain and surrounding structures were acquired without intravenous contrast. Angiographic images of the Circle of Willis were acquired using MRA technique without intravenous contrast. COMPARISON:  Head CT from earlier today FINDINGS: MRI HEAD FINDINGS Brain: No acute infarction, hemorrhage, hydrocephalus, extra-axial collection or mass lesion. Extensive FLAIR hyperintensity in the cerebral white matter attributed to chronic small vessel ischemia. Generalized brain atrophy that is moderate. Lipoma at the foramina [facility] measuring 5 mm, fat signal confirmed by prior head CT. Vascular: See below Skull and upper cervical spine: No focal marrow lesion. Presumed retention cyst in the left parietal scalp Sinuses/Orbits: No acute finding MRA HEAD FINDINGS Anterior circulation: Major vessels are smoothly contoured and diffusely patent. No aneurysm or vascular malformation. Posterior circulation: Major vessels are smoothly contoured and diffusely patent. Negative for aneurysm or vascular malformation. Fetal type right PCA flow. Dominant right vertebral artery. IMPRESSION: Brain MRI: 1. No acute or reversible finding. 2. Atrophy with confluent chronic small vessel ischemia in the hemispheric white matter. Intracranial MRA: Unremarkable. Electronically Signed   By: Tiburcio Pea M.D.   On: 12/31/2023 19:30   MR ANGIO HEAD WO CONTRAST Result Date: 12/31/2023 CLINICAL DATA:  Stroke follow-up EXAM: MRI HEAD WITHOUT CONTRAST MRA HEAD WITHOUT CONTRAST TECHNIQUE: Multiplanar, multi-echo pulse sequences of the brain and surrounding structures  were acquired without intravenous contrast. Angiographic images of the Circle of Willis were acquired using MRA technique without intravenous contrast. COMPARISON:  Head CT from earlier today FINDINGS: MRI HEAD FINDINGS Brain: No acute infarction, hemorrhage, hydrocephalus, extra-axial collection or mass lesion. Extensive FLAIR hyperintensity in the cerebral white matter attributed to chronic small vessel ischemia. Generalized brain atrophy that is moderate. Lipoma at the foramina [facility] measuring 5 mm, fat signal confirmed by prior head CT. Vascular: See below Skull and upper cervical spine: No focal marrow lesion. Presumed retention cyst in the left parietal scalp Sinuses/Orbits: No acute finding MRA HEAD FINDINGS Anterior circulation: Major vessels are smoothly contoured and diffusely patent. No aneurysm or vascular malformation. Posterior circulation: Major vessels are smoothly contoured and diffusely patent. Negative for aneurysm or vascular malformation. Fetal type right PCA flow. Dominant right vertebral artery. IMPRESSION: Brain MRI: 1. No acute or reversible finding. 2. Atrophy with confluent chronic small vessel ischemia in the hemispheric white matter. Intracranial MRA: Unremarkable. Electronically Signed   By: Tiburcio Pea M.D.   On: 12/31/2023 19:30   CT HEAD CODE STROKE WO CONTRAST Result Date: 12/31/2023 CLINICAL DATA:  Code stroke. Neuro deficit, acute, stroke suspected. EXAM: CT HEAD WITHOUT CONTRAST TECHNIQUE: Contiguous axial images were obtained from the base of the skull through the vertex without intravenous contrast. RADIATION DOSE REDUCTION: This exam was performed according to the departmental dose-optimization program which includes automated exposure control, adjustment of the mA and/or kV according to patient size and/or use of iterative reconstruction technique. COMPARISON:  Head CT 07/10/2023 and MRI 11/01/2021 FINDINGS: Brain: There is no evidence of an acute infarct,  intracranial hemorrhage, midline shift, or extra-axial fluid collection. There is mild cerebral atrophy. Confluent hypodensities in the cerebral white matter are unchanged and nonspecific but compatible with moderately extensive chronic small vessel ischemic disease. A 6 mm lipoma at the foramen of [place]ro is unchanged and without associated hydrocephalus. Vascular: No hyperdense vessel. Skull: No acute fracture or suspicious lesion. Sinuses/Orbits: Paranasal sinuses and mastoid air cells are clear. Unremarkable orbits. Other: None. ASPECTS [facility] Stroke Program Early CT Score) - Ganglionic level infarction (caudate, lentiform nuclei, internal capsule, insula, M1-M3 cortex): 7 - Supraganglionic  infarction (M4-M6 cortex): 3 Total score (0-10 with 10 being normal): 10 These results were communicated to Dr. Iver Nestle at 4:42 pm on 12/31/2023 by text page via the [facility] messaging system. IMPRESSION: 1. No evidence of acute intracranial abnormality. ASPECTS of 10. 2. Moderately extensive chronic small vessel ischemic disease. Electronically Signed   By: Sebastian Ache M.D.   On: 12/31/2023 16:42    Procedures Procedures    Medications Ordered in ED Medications - No data to display  ED Course/ Medical Decision Making/ A&P                                 Medical Decision Making Amount and/or Complexity of Data Reviewed Labs: ordered. Radiology: ordered.   This patient presents to the ED for concern of weakness, this involves an extensive number of treatment options, and is a complaint that carries with it a high risk of complications and morbidity.  The differential diagnosis includes cva, hypotension, electrolyte abn, infection   Co morbidities that complicate the patient evaluation  ESRD on HD (MWF), DM, HTN, gout, hld, HIV, hx crack use, asthma, depression, afib, pe, and anemia.   Additional history obtained:  Additional history obtained from epic chart review External records from outside source  obtained and reviewed including EMS report   Lab Tests:  I Ordered, and personally interpreted labs.  The pertinent results include:  cbc with hgb 10.2 (stable), cmp with cr elevated at 4.47 (stable), etoh neg   Imaging Studies ordered:  I ordered imaging studies including CT head, mri   I independently visualized and interpreted imaging which showed  CT head: No evidence of acute intracranial abnormality. ASPECTS of 10.  2. Moderately extensive chronic small vessel ischemic disease.  MRI/MRA brain: Brain MRI:    1. No acute or reversible finding.  2. Atrophy with confluent chronic small vessel ischemia in the  hemispheric white matter.    Intracranial MRA:    Unremarkable.    I agree with the radiologist interpretation   Cardiac Monitoring:  The patient was maintained on a cardiac monitor.  I personally viewed and interpreted the cardiac monitored which showed an underlying rhythm of: nsr    Medicines ordered and prescription drug management:   I have reviewed the patients home medicines and have made adjustments as needed   Test Considered:  Ct/mri   Critical Interventions:  Code stroke   Consultations Obtained:  I requested consultation with the neurologist (Dr. Iver Nestle),  and discussed lab and imaging findings as well as pertinent plan - she recommended MRI/MRI.  If that is normal, neuro will sign off.     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  He is negative for CVA.  He is stable for d/c.  Return if worse.  F/u with pcp. Hx afib:  pt is off eliquis.  He is to talk with pcp/cards to determine if he needs to go back on it.  CHA2DS2/VAS Stroke Risk Points  Current as of about an hour ago     7 >= 2 Points: High Risk  1 to 1.99 Points: Medium Risk  0 Points: Low Risk    Last Change: N/A      Details    This score determines the patient's risk of having a stroke if the  patient has atrial fibrillation.        Points Metrics  1  Has Congestive Heart Failure:  Yes    Current as of about an hour ago  1 Has Vascular Disease:  Yes    Current as of about an hour ago  1 Has Hypertension:  Yes    Current as of about an hour ago  1 Age:  49    Current as of about an hour ago  1 Has Diabetes Excluding Gestational Diabetes:  Yes    Current as of about an hour ago  2 Had Stroke:  Yes   Had TIA:  No  Had Thromboembolism:  Yes    Current as of about an hour ago  0 Male:  No    Current as of about an hour ago             Reevaluation:  After the interventions noted above, I reevaluated the patient and found that they have :improved   Social Determinants of Health:  Lives in a facility   Dispostion:  After consideration of the diagnostic results and the patients response to treatment, I feel that the patent would benefit from discharge with outpatient f/u.          Final Clinical Impression(s) / ED Diagnoses Final diagnoses:  Weakness  ESRD on hemodialysis [facility])    Rx / DC Orders ED Discharge Orders     None         Jacalyn Lefevre, MD 12/31/23 [id number]

## 2023-12-31 NOTE — ED Notes (Signed)
 [facility] called for transport back to [facility]. No ETA given, but states it will be awhile

## 2023-12-31 NOTE — ED Notes (Signed)
 Pt was stuck. Wasn't successful.

## 2024-01-27 ENCOUNTER — Other Ambulatory Visit: Payer: Self-pay

## 2024-01-27 ENCOUNTER — Encounter (HOSPITAL_COMMUNITY)
Admission: RE | Disposition: A | Discharge status: Home / Self Care | Payer: Self-pay | Source: Home / Self Care | Attending: Nephrology

## 2024-01-27 ENCOUNTER — Ambulatory Visit (HOSPITAL_COMMUNITY)
Admission: RE | Admit: 2024-01-27 | Discharge: 2024-01-27 | Disposition: A | Discharge status: Home / Self Care | Attending: Nephrology | Admitting: Nephrology

## 2024-01-27 DIAGNOSIS — N250 Renal osteodystrophy: Secondary | ICD-10-CM | POA: Insufficient documentation

## 2024-01-27 DIAGNOSIS — Z992 Dependence on renal dialysis: Secondary | ICD-10-CM | POA: Insufficient documentation

## 2024-01-27 DIAGNOSIS — T82858A Stenosis of vascular prosthetic devices, implants and grafts, initial encounter: Secondary | ICD-10-CM | POA: Diagnosis present

## 2024-01-27 DIAGNOSIS — N186 End stage renal disease: Secondary | ICD-10-CM | POA: Diagnosis not present

## 2024-01-27 DIAGNOSIS — Y832 Surgical operation with anastomosis, bypass or graft as the cause of abnormal reaction of the patient, or of later complication, without mention of misadventure at the time of the procedure: Secondary | ICD-10-CM | POA: Insufficient documentation

## 2024-01-27 DIAGNOSIS — D631 Anemia in chronic kidney disease: Secondary | ICD-10-CM | POA: Diagnosis not present

## 2024-01-27 DIAGNOSIS — E1122 Type 2 diabetes mellitus with diabetic chronic kidney disease: Secondary | ICD-10-CM | POA: Insufficient documentation

## 2024-01-27 DIAGNOSIS — I871 Compression of vein: Secondary | ICD-10-CM | POA: Diagnosis not present

## 2024-01-27 DIAGNOSIS — I120 Hypertensive chronic kidney disease with stage 5 chronic kidney disease or end stage renal disease: Secondary | ICD-10-CM | POA: Diagnosis not present

## 2024-01-27 HISTORY — PX: VENOUS ANGIOPLASTY: CATH118376

## 2024-01-27 HISTORY — PX: A/V SHUNT INTERVENTION: CATH118220

## 2024-01-27 LAB — GLUCOSE, CAPILLARY: Glucose-Capillary: 115 mg/dL — ABNORMAL HIGH (ref 70–99)

## 2024-01-27 SURGERY — A/V SHUNT INTERVENTION
Anesthesia: LOCAL

## 2024-01-27 MED ORDER — LIDOCAINE HCL (PF) 1 % IJ SOLN
INTRAMUSCULAR | Status: DC | PRN
  Administered 2024-01-27: 2 mL

## 2024-01-27 MED ORDER — IODIXANOL 320 MG/ML IV SOLN
INTRAVENOUS | Status: DC | PRN
  Administered 2024-01-27: 6 mL via INTRAVENOUS

## 2024-01-27 MED ORDER — MIDAZOLAM HCL 2 MG/2ML IJ SOLN
INTRAMUSCULAR | Status: AC
  Filled 2024-01-27: qty 2

## 2024-01-27 MED ORDER — HEPARIN (PORCINE) IN NACL 1000-0.9 UT/500ML-% IV SOLN
INTRAVENOUS | Status: DC | PRN
  Administered 2024-01-27: 500 mL

## 2024-01-27 MED ORDER — LIDOCAINE HCL (PF) 1 % IJ SOLN
INTRAMUSCULAR | Status: AC
  Filled 2024-01-27: qty 30

## 2024-01-27 MED ORDER — FENTANYL CITRATE (PF) 100 MCG/2ML IJ SOLN
INTRAMUSCULAR | Status: AC
  Filled 2024-01-27: qty 2

## 2024-01-27 MED ORDER — FENTANYL CITRATE (PF) 100 MCG/2ML IJ SOLN
INTRAMUSCULAR | Status: DC | PRN
  Administered 2024-01-27: 25 ug via INTRAVENOUS

## 2024-01-27 MED ORDER — MIDAZOLAM HCL 2 MG/2ML IJ SOLN
INTRAMUSCULAR | Status: DC | PRN
  Administered 2024-01-27: 1 mg via INTRAVENOUS

## 2024-01-27 SURGICAL SUPPLY — 7 items
BAG SNAP BAND KOVER 36X36 (MISCELLANEOUS) ×2 IMPLANT
BALLOON MUSTANG 8.0X40 75 (BALLOONS) IMPLANT
COVER DOME SNAP 22 D (MISCELLANEOUS) ×2 IMPLANT
SHEATH PINNACLE R/O II 6F 4CM (SHEATH) IMPLANT
SYR MEDALLION 10ML (SYRINGE) IMPLANT
TRAY PV CATH (CUSTOM PROCEDURE TRAY) ×2 IMPLANT
WIRE BENTSON .035X145CM (WIRE) IMPLANT

## 2024-01-27 NOTE — H&P (Signed)
 Chief Complaint: Decreased flows  Interval H&P  The patient has presented today for an angiogram/ angioplasty.  Various methods of treatment have been discussed with the patient.  After consideration of risk, benefits and other options for treatment, the patient has consented to a angiogram/ angioplasty with  possible stent placement.   Risks of angiogram with potential angioplasty and stenting if needed.contrast reaction, extravasation/ bleeding, dissection, hypotension and death were explained to the patient.  The patient's history has been reviewed and the patient has been examined, no changes in status.  Stable for angiogram/angioplasty  I have reviewed the patient's chart and labs.  Questions were answered to the patient's satisfaction.  Assessment/Plan: ESRD dialyzing MWF regimen  Decreased access flows and a left upper arm AV graft [facility] on September 11, 2023.- planning on angiogram with possibly angioplasty. Renal osteodystrophy - continue binders per home regimen. Anemia - managed with ESA's and IV iron at dialysis center. HTN - resume home regimen.   HPI: Michael [name] is an 73 y.o. male with a history of hypertension, diabetes, paroxysmal atrial fibrillation, coronary atherosclerotic heart disease status post CABG, hyperlipidemia, ESRD being referred for poor access flows.  ROS Per HPI.  Chemistry and CBC: Creat  Date/Time Value Ref Range Status  04/25/2021 12:00 AM 1.97 (H) 0.70 - 1.28 mg/dL Final  64/33/2951 88:41 AM 1.52 (H) 0.70 - 1.25 mg/dL Final    Comment:    For patients >51 years of age, the reference limit for Creatinine is approximately 13% higher for people identified as African-American. Aaron Aas   01/08/2016 10:23 AM 1.14 0.70 - 1.25 mg/dL Final  66/03/3015 01:09 AM 1.29 0.50 - 1.35 mg/dL Final  32/35/5732 20:25 AM 1.13 0.50 - 1.35 mg/dL Final  42/70/6237 62:83 PM 1.31 0.50 - 1.35 mg/dL Final  15/17/6160 73:71 AM 1.06 0.50 - 1.35 mg/dL Final   04/01/9484 46:27 PM 1.08 0.50 - 1.35 mg/dL Final  03/50/0938 18:29 AM 1.41 (H) 0.50 - 1.35 mg/dL Final  93/71/6967 89:38 PM 1.03 0.50 - 1.35 mg/dL Final  07/23/5101 58:52 AM 1.07 0.50 - 1.35 mg/dL Final   Creatinine, Ser  Date/Time Value Ref Range Status  12/31/2023 07:57 PM 4.80 (H) 0.61 - 1.24 mg/dL Final  77/82/4235 36:14 PM 4.47 (H) 0.61 - 1.24 mg/dL Final  43/15/4008 67:61 AM 4.23 (H) 0.61 - 1.24 mg/dL Final  95/06/3266 12:45 AM 4.34 (H) 0.61 - 1.24 mg/dL Final  80/99/8338 25:05 AM 4.64 (H) 0.61 - 1.24 mg/dL Final  39/76/7341 93:79 AM 4.64 (H) 0.61 - 1.24 mg/dL Final  02/40/9735 32:99 AM 4.57 (H) 0.61 - 1.24 mg/dL Final  24/26/8341 96:22 AM 4.77 (H) 0.61 - 1.24 mg/dL Final  29/79/8921 19:41 AM 4.81 (H) 0.61 - 1.24 mg/dL Final  74/05/1447 18:56 AM 4.74 (H) 0.61 - 1.24 mg/dL Final  31/49/7026 37:85 AM 4.79 (H) 0.61 - 1.24 mg/dL Final  88/50/2774 12:87 AM 5.50 (H) 0.61 - 1.24 mg/dL Final  86/76/7209 47:09 AM 5.04 (H) 0.61 - 1.24 mg/dL Final  62/83/6629 47:65 PM 3.62 (H) 0.61 - 1.24 mg/dL Final  46/50/3546 56:81 AM 3.76 (H) 0.61 - 1.24 mg/dL Final  27/51/7001 74:94 AM 3.89 (H) 0.61 - 1.24 mg/dL Final  49/67/5916 38:46 AM 3.82 (H) 0.61 - 1.24 mg/dL Final  65/99/3570 17:79 AM 3.95 (H) 0.61 - 1.24 mg/dL Final  39/12/90 33:00 AM 4.04 (H) 0.61 - 1.24 mg/dL Final  76/22/6333 54:56 AM 3.80 (H) 0.61 - 1.24 mg/dL Final  25/63/8937 34:28 AM 3.39 (H) 0.61 - 1.24  mg/dL Final  82/95/6213 08:65 PM 3.29 (H) 0.61 - 1.24 mg/dL Final  78/46/9629 52:84 AM 3.35 (H) 0.61 - 1.24 mg/dL Final  13/24/4010 27:25 AM 3.50 (H) 0.61 - 1.24 mg/dL Final  36/64/4034 74:25 AM 3.59 (H) 0.61 - 1.24 mg/dL Final  95/63/8756 43:32 AM 3.23 (H) 0.61 - 1.24 mg/dL Final  95/18/8416 60:63 AM 3.25 (H) 0.61 - 1.24 mg/dL Final  01/60/1093 23:55 AM 2.91 (H) 0.61 - 1.24 mg/dL Final  73/22/0254 27:06 PM 3.29 (H) 0.61 - 1.24 mg/dL Final  23/76/2831 51:76 AM 3.58 (H) 0.61 - 1.24 mg/dL Final  16/04/3709 62:69 PM 3.40 (H) 0.61  - 1.24 mg/dL Final  48/54/6270 35:00 AM 3.55 (H) 0.61 - 1.24 mg/dL Final  93/81/8299 37:16 AM 3.41 (H) 0.61 - 1.24 mg/dL Final  96/78/9381 01:75 PM 4.40 (H) 0.61 - 1.24 mg/dL Final  07/31/8526 78:24 AM 3.59 (H) 0.61 - 1.24 mg/dL Final  23/53/6144 31:54 AM 3.51 (H) 0.61 - 1.24 mg/dL Final  00/86/7619 50:93 AM 3.35 (H) 0.61 - 1.24 mg/dL Final  26/71/2458 09:98 AM 3.50 (H) 0.61 - 1.24 mg/dL Final  33/82/5053 97:67 PM 3.45 (H) 0.61 - 1.24 mg/dL Final  34/19/3790 24:09 AM 4.26 (H) 0.61 - 1.24 mg/dL Final  73/53/2992 42:68 AM 4.84 (H) 0.61 - 1.24 mg/dL Final  34/19/6222 97:98 PM 3.58 (H) 0.61 - 1.24 mg/dL Final  92/08/9416 40:81 PM 3.39 (H) 0.61 - 1.24 mg/dL Final  44/81/8563 14:97 AM 2.84 (H) 0.61 - 1.24 mg/dL Final  02/63/7858 85:02 PM 3.21 (H) 0.61 - 1.24 mg/dL Final  77/41/2878 67:67 PM 3.31 (H) 0.61 - 1.24 mg/dL Final  20/94/7096 28:36 PM 3.38 (H) 0.61 - 1.24 mg/dL Final  62/94/7654 65:03 AM 2.66 (H) 0.61 - 1.24 mg/dL Final  54/65/6812 75:17 AM 2.94 (H) 0.61 - 1.24 mg/dL Final  00/17/4944 96:75 AM 3.55 (H) 0.61 - 1.24 mg/dL Final  91/63/8466 59:93 AM 4.37 (H) 0.61 - 1.24 mg/dL Final  57/10/7791 90:30 AM 3.24 (H) 0.61 - 1.24 mg/dL Final   No results for input(s): "NA", "K", "CL", "CO2", "GLUCOSE", "BUN", "CREATININE", "CALCIUM ", "PHOS" in the last 168 hours.  Invalid input(s): "ALB" No results for input(s): "WBC", "NEUTROABS", "HGB", "HCT", "MCV", "PLT" in the last 168 hours. Liver Function Tests: No results for input(s): "AST", "ALT", "ALKPHOS", "BILITOT", "PROT", "ALBUMIN " in the last 168 hours. No results for input(s): "LIPASE", "AMYLASE" in the last 168 hours. No results for input(s): "AMMONIA " in the last 168 hours. Cardiac Enzymes: No results for input(s): "CKTOTAL", "CKMB", "CKMBINDEX", "TROPONINI" in the last 168 hours. Iron Studies: No results for input(s): "IRON", "TIBC", "TRANSFERRIN", "FERRITIN" in the last 72 hours. PT/INR: @LABRCNTIP (inr:5)  Xrays/Other  Studies: ) Results for orders placed or performed during the hospital encounter of 01/27/24 (from the past 48 hours)  Glucose, capillary     Status: Abnormal   Collection Time: 01/27/24  7:05 AM  Result Value Ref Range   Glucose-Capillary 115 (H) 70 - 99 mg/dL    Comment: Glucose reference range applies only to samples taken after fasting for at least 8 hours.   No results found.  PMH:   Past Medical History:  Diagnosis Date   A-fib ([name])    Anemia    Asthma    No PFTs, history of childhood asthma   CAD (coronary artery disease)    Cellulitis 04/2014   left facial   Cellulitis and abscess of toe of right foot 12/08/2019   Chondromalacia of medial femoral condyle    Left knee  MRI 04/28/12: Chondromalacia of the medial femoral condyle with slight peripheral degeneration of the meniscocapsular junction of the medial meniscus; followed by sports medicine   Chronic kidney failure, stage 4 (severe) ([name])    Collagen vascular disease ([name])    Crack cocaine use    for 20+ years, has been enrolled in detox programs in the past   Depression    with history of hospitalization for suicidal ideation   Diabetes mellitus 2002   Diagnosed in 2002, started insulin  in 2012   Gout    Headache(784.0)    CT head 08/2011: Periventricular and subcortical white matter hypodensities are most in keeping with chronic microangiopathic change   HIV infection ([name]) 08/2011   Followed by Dr. Alwin Baars   Hyperlipidemia    Hypertension    Pulmonary embolism ([name])     PSH:   Past Surgical History:  Procedure Laterality Date   AMPUTATION Right 07/21/2019   Procedure: RIGHT SECOND TOE AMPUTATION;  Surgeon: Timothy Ford, MD;  Location: [facility] OR;  Service: Orthopedics;  Laterality: Right;   BACK SURGERY     1988   BIOPSY  03/20/2023   Procedure: BIOPSY;  Surgeon: Annis Kinder, DO;  Location: [facility];  Service: Gastroenterology;;   BOWEL RESECTION     CARDIAC SURGERY     CERVICAL SPINE SURGERY      " rods in my neck "   COLONOSCOPY WITH PROPOFOL  N/A 03/20/2023   Procedure: COLONOSCOPY WITH PROPOFOL ;  Surgeon: Annis Kinder, DO;  Location: [facility];  Service: Gastroenterology;  Laterality: N/A;   CORONARY ARTERY BYPASS GRAFT     CORONARY STENT PLACEMENT     ESOPHAGOGASTRODUODENOSCOPY (EGD) WITH PROPOFOL  N/A 03/20/2023   Procedure: ESOPHAGOGASTRODUODENOSCOPY (EGD) WITH PROPOFOL ;  Surgeon: Annis Kinder, DO;  Location: [facility];  Service: Gastroenterology;  Laterality: N/A;   NM MYOCAR PERF WALL MOTION  12/27/2011   normal   POLYPECTOMY  03/20/2023   Procedure: POLYPECTOMY;  Surgeon: Annis Kinder, DO;  Location: [facility];  Service: Gastroenterology;;   SPINE SURGERY      Allergies: No Known Allergies  Medications:   Prior to Admission medications   Medication Sig Start Date End Date Taking? Authorizing Provider  acetaminophen  (TYLENOL ) 325 MG tablet Take 650 mg by mouth every 6 (six) hours as needed (pain/fever).   Yes [provider]  albuterol  (VENTOLIN  HFA) 108 (90 Base) MCG/ACT inhaler Inhale 1 puff into the lungs every 4 (four) hours as needed for wheezing or shortness of breath.   Yes [provider]  atorvastatin  (LIPITOR) 40 MG tablet Take 40 mg by mouth at bedtime. (2100)   Yes [provider]  bictegravir-emtricitabine -tenofovir  AF (BIKTARVY ) 50-200-25 MG TABS tablet Take 1 tablet by mouth daily. Patient taking differently: Take 1 tablet by mouth at bedtime. (2100) 03/14/23  Yes Liane Redman, MD  clopidogrel  (PLAVIX ) 75 MG tablet Take 75 mg by mouth at bedtime. (2100)   Yes [provider]  diltiazem  (CARDIZEM ) 120 MG tablet Take 120 mg by mouth at bedtime. (2100) 01/17/24  Yes [provider]  famotidine  (PEPCID ) 20 MG tablet Take 20 mg by mouth at bedtime. (2100)   Yes [provider]  FLUoxetine  (PROZAC ) 20 MG capsule Take 20 mg by mouth at bedtime. (2100)   Yes [provider]   fluticasone  furoate-vilanterol (BREO ELLIPTA ) 100-25 MCG/ACT AEPB Inhale 1 puff into the lungs daily. (0900)   Yes [provider]  gabapentin  (NEURONTIN ) 300 MG capsule Take  300 mg by mouth at bedtime. (2100)   Yes [provider]  magnesium  hydroxide (MILK OF MAGNESIA) 400 MG/5ML suspension Take 30 mLs by mouth daily as needed (constipation (no bm in 3 days)).   Yes [provider]  multivitamin (RENA-VIT) TABS tablet Take 1 tablet by mouth in the morning. (0900)   Yes [provider]  nitroGLYCERIN  (NITROSTAT ) 0.4 MG SL tablet Place 1 tablet (0.4 mg total) under the tongue every 5 (five) minutes as needed for chest pain. 02/19/23  Yes Haydee Lipa, MD  ondansetron  (ZOFRAN ) 4 MG tablet Take 4 mg by mouth every 4 (four) hours as needed for nausea or vomiting.   Yes [provider]  senna-docusate (SENOKOT-S) 8.6-50 MG tablet Take 2 tablets by mouth at bedtime. (2100)   Yes [provider]  sevelamer  carbonate (RENVELA ) 800 MG tablet Take 800 mg by mouth 3 (three) times daily with meals. (0800, 1100 & 1600)   Yes [provider]  Tiotropium Bromide Monohydrate (SPIRIVA RESPIMAT) 2.5 MCG/ACT AERS Inhale 1 puff into the lungs in the morning. (0900)   Yes [provider]  traZODone  (DESYREL ) 50 MG tablet Take 50 mg by mouth at bedtime. (2100)   Yes [provider]  Vitamin D, Ergocalciferol, (DRISDOL) 1.25 MG (50000 UNIT) CAPS capsule Take 50,000 Units by mouth every Friday. (2100)   Yes [provider]    Discontinued Meds:   Medications Discontinued During This Encounter  Medication Reason   fluticasone  furoate-vilanterol (BREO ELLIPTA ) 200-25 MCG/ACT AEPB Dose change   atorvastatin  (LIPITOR) 40 MG tablet Duplicate   diltiazem  (CARDIZEM  CD) 180 MG 24 hr capsule Duplicate   diltiazem  (CARDIZEM  LA) 120 MG 24 hr tablet Duplicate   senna-docusate (SENOKOT-S) 8.6-50 MG tablet Duplicate   albuterol   (VENTOLIN  HFA) 108 (90 Base) MCG/ACT inhaler Duplicate   DULoxetine  (CYMBALTA ) 30 MG capsule Discontinued by provider   linaclotide  (LINZESS ) 290 MCG CAPS capsule Change in therapy   pantoprazole  (PROTONIX ) 40 MG tablet Change in therapy   sorbitol  70 % SOLN Change in therapy   polyethylene glycol powder (GLYCOLAX /MIRALAX ) 17 GM/SCOOP powder No longer needed (for PRN medications)   aspirin  81 MG chewable tablet Change in therapy    Social History:  reports that he has never smoked. He has never used smokeless tobacco. He reports that he does not currently use drugs after having used the following drugs: "Crack" cocaine and Cocaine. Frequency: 4.00 times per week. He reports that he does not drink alcohol.  Family History:   Family History  Problem Relation Age of Onset   Diabetes Mother    Hypertension Mother    Hyperlipidemia Mother    Diabetes Father    Cancer Father    Hypertension Father    Diabetes Brother    Heart disease Brother    Diabetes Sister    Colon cancer Neg Hx     Blood pressure 116/65, pulse 65, temperature (P) 98.2 F (36.8 C), resp. rate 14, weight 65.8 kg, SpO2 99%. GEN: NAD, A&Ox3, NCAT HEENT: No conjunctival pallor, EOMI NECK: Supple, no thyromegaly LUNGS: CTA B/L no rales, rhonchi or wheezing CV: RRR, No M/R/G ABD: SNDNT +BS  EXT: No lower extremity edema ACCESS: Upper arm AV graft positive thrill along the arterial anastomosis        Ebb Carelock, Alveda Aures, MD 01/27/2024, 7:41 AM

## 2024-01-27 NOTE — H&P (View-Only) (Signed)
 Chief Complaint: Decreased flows  Interval H&P  The patient has presented today for an angiogram/ angioplasty.  Various methods of treatment have been discussed with the patient.  After consideration of risk, benefits and other options for treatment, the patient has consented to a angiogram/ angioplasty with  possible stent placement.   Risks of angiogram with potential angioplasty and stenting if needed.contrast reaction, extravasation/ bleeding, dissection, hypotension and death were explained to the patient.  The patient's history has been reviewed and the patient has been examined, no changes in status.  Stable for angiogram/angioplasty  I have reviewed the patient's chart and labs.  Questions were answered to the patient's satisfaction.  Assessment/Plan: ESRD dialyzing MWF regimen  Decreased access flows and a left upper arm AV graft [facility], [facility].- planning on angiogram with possibly angioplasty. Renal osteodystrophy - continue binders per home regimen. Anemia - managed with ESA's and IV iron at dialysis center. HTN - resume home regimen.   HPI: Michael [name] is an 73 y.o. male with a history of hypertension, diabetes, paroxysmal atrial fibrillation, coronary atherosclerotic heart disease status post CABG, hyperlipidemia, ESRD being referred for poor access flows.  ROS Per HPI.  Chemistry and CBC: Creat  Date/Time Value Ref Range Status  04/25/2021 12:00 AM 1.97 (H) 0.70 - 1.28 mg/dL Final  16/07/9603 54:09 AM 1.52 (H) 0.70 - 1.25 mg/dL Final    Comment:    For patients >69 years of age, the reference limit for Creatinine is approximately 13% higher for people identified as African-American. Aaron Aas   01/08/2016 10:23 AM 1.14 0.70 - 1.25 mg/dL Final  81/19/1478 29:56 AM 1.29 0.50 - 1.35 mg/dL Final  21/30/8657 84:69 AM 1.13 0.50 - 1.35 mg/dL Final  62/95/2841 32:44 PM 1.31 0.50 - 1.35 mg/dL Final  10/09/7251 66:44 AM 1.06 0.50 - 1.35 mg/dL Final   03/47/4259 56:38 PM 1.08 0.50 - 1.35 mg/dL Final  75/64/3329 51:88 AM 1.41 (H) 0.50 - 1.35 mg/dL Final  41/66/0630 16:01 PM 1.03 0.50 - 1.35 mg/dL Final  09/32/3557 32:20 AM 1.07 0.50 - 1.35 mg/dL Final   Creatinine, Ser  Date/Time Value Ref Range Status  12/31/2023 07:57 PM 4.80 (H) 0.61 - 1.24 mg/dL Final  25/42/7062 37:62 PM 4.47 (H) 0.61 - 1.24 mg/dL Final  83/15/1761 60:73 AM 4.23 (H) 0.61 - 1.24 mg/dL Final  71/03/2693 85:46 AM 4.34 (H) 0.61 - 1.24 mg/dL Final  27/12/5007 38:18 AM 4.64 (H) 0.61 - 1.24 mg/dL Final  29/93/7169 67:89 AM 4.64 (H) 0.61 - 1.24 mg/dL Final  38/07/1750 02:58 AM 4.57 (H) 0.61 - 1.24 mg/dL Final  52/77/8242 35:36 AM 4.77 (H) 0.61 - 1.24 mg/dL Final  14/43/1540 08:67 AM 4.81 (H) 0.61 - 1.24 mg/dL Final  61/95/0932 67:12 AM 4.74 (H) 0.61 - 1.24 mg/dL Final  45/80/9983 38:25 AM 4.79 (H) 0.61 - 1.24 mg/dL Final  05/39/7673 41:93 AM 5.50 (H) 0.61 - 1.24 mg/dL Final  79/11/4095 35:32 AM 5.04 (H) 0.61 - 1.24 mg/dL Final  99/24/2683 41:96 PM 3.62 (H) 0.61 - 1.24 mg/dL Final  22/29/7989 21:19 AM 3.76 (H) 0.61 - 1.24 mg/dL Final  41/74/0814 48:18 AM 3.89 (H) 0.61 - 1.24 mg/dL Final  56/31/4970 26:37 AM 3.82 (H) 0.61 - 1.24 mg/dL Final  85/88/5027 74:12 AM 3.95 (H) 0.61 - 1.24 mg/dL Final  87/86/7672 09:47 AM 4.04 (H) 0.61 - 1.24 mg/dL Final  09/62/8366 29:47 AM 3.80 (H) 0.61 - 1.24 mg/dL Final  65/46/5035 46:56 AM 3.39 (H) 0.61 - 1.24  mg/dL Final  82/95/6213 08:65 PM 3.29 (H) 0.61 - 1.24 mg/dL Final  78/46/9629 52:84 AM 3.35 (H) 0.61 - 1.24 mg/dL Final  13/24/4010 27:25 AM 3.50 (H) 0.61 - 1.24 mg/dL Final  36/64/4034 74:25 AM 3.59 (H) 0.61 - 1.24 mg/dL Final  95/63/8756 43:32 AM 3.23 (H) 0.61 - 1.24 mg/dL Final  95/18/8416 60:63 AM 3.25 (H) 0.61 - 1.24 mg/dL Final  01/60/1093 23:55 AM 2.91 (H) 0.61 - 1.24 mg/dL Final  73/22/0254 27:06 PM 3.29 (H) 0.61 - 1.24 mg/dL Final  23/76/2831 51:76 AM 3.58 (H) 0.61 - 1.24 mg/dL Final  16/04/3709 62:69 PM 3.40 (H) 0.61  - 1.24 mg/dL Final  48/54/6270 35:00 AM 3.55 (H) 0.61 - 1.24 mg/dL Final  93/81/8299 37:16 AM 3.41 (H) 0.61 - 1.24 mg/dL Final  96/78/9381 01:75 PM 4.40 (H) 0.61 - 1.24 mg/dL Final  07/31/8526 78:24 AM 3.59 (H) 0.61 - 1.24 mg/dL Final  23/53/6144 31:54 AM 3.51 (H) 0.61 - 1.24 mg/dL Final  00/86/7619 50:93 AM 3.35 (H) 0.61 - 1.24 mg/dL Final  26/71/2458 09:98 AM 3.50 (H) 0.61 - 1.24 mg/dL Final  33/82/5053 97:67 PM 3.45 (H) 0.61 - 1.24 mg/dL Final  34/19/3790 24:09 AM 4.26 (H) 0.61 - 1.24 mg/dL Final  73/53/2992 42:68 AM 4.84 (H) 0.61 - 1.24 mg/dL Final  34/19/6222 97:98 PM 3.58 (H) 0.61 - 1.24 mg/dL Final  92/08/9416 40:81 PM 3.39 (H) 0.61 - 1.24 mg/dL Final  44/81/8563 14:97 AM 2.84 (H) 0.61 - 1.24 mg/dL Final  02/63/7858 85:02 PM 3.21 (H) 0.61 - 1.24 mg/dL Final  77/41/2878 67:67 PM 3.31 (H) 0.61 - 1.24 mg/dL Final  20/94/7096 28:36 PM 3.38 (H) 0.61 - 1.24 mg/dL Final  62/94/7654 65:03 AM 2.66 (H) 0.61 - 1.24 mg/dL Final  54/65/6812 75:17 AM 2.94 (H) 0.61 - 1.24 mg/dL Final  00/17/4944 96:75 AM 3.55 (H) 0.61 - 1.24 mg/dL Final  91/63/8466 59:93 AM 4.37 (H) 0.61 - 1.24 mg/dL Final  57/10/7791 90:30 AM 3.24 (H) 0.61 - 1.24 mg/dL Final   No results for input(s): "NA", "K", "CL", "CO2", "GLUCOSE", "BUN", "CREATININE", "CALCIUM ", "PHOS" in the last 168 hours.  Invalid input(s): "ALB" No results for input(s): "WBC", "NEUTROABS", "HGB", "HCT", "MCV", "PLT" in the last 168 hours. Liver Function Tests: No results for input(s): "AST", "ALT", "ALKPHOS", "BILITOT", "PROT", "ALBUMIN " in the last 168 hours. No results for input(s): "LIPASE", "AMYLASE" in the last 168 hours. No results for input(s): "AMMONIA " in the last 168 hours. Cardiac Enzymes: No results for input(s): "CKTOTAL", "CKMB", "CKMBINDEX", "TROPONINI" in the last 168 hours. Iron Studies: No results for input(s): "IRON", "TIBC", "TRANSFERRIN", "FERRITIN" in the last 72 hours. PT/INR: @LABRCNTIP (inr:5)  Xrays/Other  Studies: ) Results for orders placed or performed during the hospital encounter of 01/27/24 (from the past 48 hours)  Glucose, capillary     Status: Abnormal   Collection Time: 01/27/24  7:05 AM  Result Value Ref Range   Glucose-Capillary 115 (H) 70 - 99 mg/dL    Comment: Glucose reference range applies only to samples taken after fasting for at least 8 hours.   No results found.  PMH:   Past Medical History:  Diagnosis Date   A-fib ([name])    Anemia    Asthma    No PFTs, history of childhood asthma   CAD (coronary artery disease)    Cellulitis 04/2014   left facial   Cellulitis and abscess of toe of right foot 12/08/2019   Chondromalacia of medial femoral condyle    Left knee  MRI 04/28/12: Chondromalacia of the medial femoral condyle with slight peripheral degeneration of the meniscocapsular junction of the medial meniscus; followed by sports medicine   Chronic kidney failure, stage 4 (severe) ([name])    Collagen vascular disease ([name])    Crack cocaine use    for 20+ years, has been enrolled in detox programs in the past   Depression    with history of hospitalization for suicidal ideation   Diabetes mellitus 2002   Diagnosed in 2002, started insulin  in 2012   Gout    Headache(784.0)    CT head 08/2011: Periventricular and subcortical white matter hypodensities are most in keeping with chronic microangiopathic change   HIV infection ([name]) 08/2011   Followed by Dr. Alwin Baars   Hyperlipidemia    Hypertension    Pulmonary embolism ([name])     PSH:   Past Surgical History:  Procedure Laterality Date   AMPUTATION Right 07/21/2019   Procedure: RIGHT SECOND TOE AMPUTATION;  Surgeon: Timothy Ford, MD;  Location: [facility];  Service: Orthopedics;  Laterality: Right;   BACK SURGERY     1988   BIOPSY  03/20/2023   Procedure: BIOPSY;  Surgeon: Annis Kinder, DO;  Location: [facility];  Service: Gastroenterology;;   BOWEL RESECTION     CARDIAC SURGERY     CERVICAL SPINE SURGERY      " rods in my neck "   COLONOSCOPY WITH PROPOFOL  N/A 03/20/2023   Procedure: COLONOSCOPY WITH PROPOFOL ;  Surgeon: Annis Kinder, DO;  Location: [facility];  Service: Gastroenterology;  Laterality: N/A;   CORONARY ARTERY BYPASS GRAFT     CORONARY STENT PLACEMENT     ESOPHAGOGASTRODUODENOSCOPY (EGD) WITH PROPOFOL  N/A 03/20/2023   Procedure: ESOPHAGOGASTRODUODENOSCOPY (EGD) WITH PROPOFOL ;  Surgeon: Annis Kinder, DO;  Location: [facility];  Service: Gastroenterology;  Laterality: N/A;   NM MYOCAR PERF WALL MOTION  12/27/2011   normal   POLYPECTOMY  03/20/2023   Procedure: POLYPECTOMY;  Surgeon: Annis Kinder, DO;  Location: [facility];  Service: Gastroenterology;;   SPINE SURGERY      Allergies: No Known Allergies  Medications:   Prior to Admission medications   Medication Sig Start Date End Date Taking? Authorizing Provider  acetaminophen  (TYLENOL ) 325 MG tablet Take 650 mg by mouth every 6 (six) hours as needed (pain/fever).   Yes [provider]  albuterol  (VENTOLIN  HFA) 108 (90 Base) MCG/ACT inhaler Inhale 1 puff into the lungs every 4 (four) hours as needed for wheezing or shortness of breath.   Yes [provider]  atorvastatin  (LIPITOR) 40 MG tablet Take 40 mg by mouth at bedtime. (2100)   Yes [provider]  bictegravir-emtricitabine -tenofovir  AF (BIKTARVY ) 50-200-25 MG TABS tablet Take 1 tablet by mouth daily. Patient taking differently: Take 1 tablet by mouth at bedtime. (2100) 03/14/23  Yes Liane Redman, MD  clopidogrel  (PLAVIX ) 75 MG tablet Take 75 mg by mouth at bedtime. (2100)   Yes [provider]  diltiazem  (CARDIZEM ) 120 MG tablet Take 120 mg by mouth at bedtime. (2100) 01/17/24  Yes [provider]  famotidine  (PEPCID ) 20 MG tablet Take 20 mg by mouth at bedtime. (2100)   Yes [provider]  FLUoxetine  (PROZAC ) 20 MG capsule Take 20 mg by mouth at bedtime. (2100)   Yes [provider]   fluticasone  furoate-vilanterol (BREO ELLIPTA ) 100-25 MCG/ACT AEPB Inhale 1 puff into the lungs daily. (0900)   Yes [provider]  gabapentin  (NEURONTIN ) 300 MG capsule Take  300 mg by mouth at bedtime. (2100)   Yes [provider]  magnesium  hydroxide (MILK OF MAGNESIA) 400 MG/5ML suspension Take 30 mLs by mouth daily as needed (constipation (no bm in 3 days)).   Yes [provider]  multivitamin (RENA-VIT) TABS tablet Take 1 tablet by mouth in the morning. (0900)   Yes [provider]  nitroGLYCERIN  (NITROSTAT ) 0.4 MG SL tablet Place 1 tablet (0.4 mg total) under the tongue every 5 (five) minutes as needed for chest pain. 02/19/23  Yes Haydee Lipa, MD  ondansetron  (ZOFRAN ) 4 MG tablet Take 4 mg by mouth every 4 (four) hours as needed for nausea or vomiting.   Yes [provider]  senna-docusate (SENOKOT-S) 8.6-50 MG tablet Take 2 tablets by mouth at bedtime. (2100)   Yes [provider]  sevelamer  carbonate (RENVELA ) 800 MG tablet Take 800 mg by mouth 3 (three) times daily with meals. (0800, 1100 & 1600)   Yes [provider]  Tiotropium Bromide Monohydrate (SPIRIVA RESPIMAT) 2.5 MCG/ACT AERS Inhale 1 puff into the lungs in the morning. (0900)   Yes [provider]  traZODone  (DESYREL ) 50 MG tablet Take 50 mg by mouth at bedtime. (2100)   Yes [provider]  Vitamin D, Ergocalciferol, (DRISDOL) 1.25 MG (50000 UNIT) CAPS capsule Take 50,000 Units by mouth every Friday. (2100)   Yes [provider]    Discontinued Meds:   Medications Discontinued During This Encounter  Medication Reason   fluticasone  furoate-vilanterol (BREO ELLIPTA ) 200-25 MCG/ACT AEPB Dose change   atorvastatin  (LIPITOR) 40 MG tablet Duplicate   diltiazem  (CARDIZEM  CD) 180 MG 24 hr capsule Duplicate   diltiazem  (CARDIZEM  LA) 120 MG 24 hr tablet Duplicate   senna-docusate (SENOKOT-S) 8.6-50 MG tablet Duplicate   albuterol   (VENTOLIN  HFA) 108 (90 Base) MCG/ACT inhaler Duplicate   DULoxetine  (CYMBALTA ) 30 MG capsule Discontinued by provider   linaclotide  (LINZESS ) 290 MCG CAPS capsule Change in therapy   pantoprazole  (PROTONIX ) 40 MG tablet Change in therapy   sorbitol  70 % SOLN Change in therapy   polyethylene glycol powder (GLYCOLAX /MIRALAX ) 17 GM/SCOOP powder No longer needed (for PRN medications)   aspirin  81 MG chewable tablet Change in therapy    Social History:  reports that he has never smoked. He has never used smokeless tobacco. He reports that he does not currently use drugs after having used the following drugs: "Crack" cocaine and Cocaine. Frequency: 4.00 times per week. He reports that he does not drink alcohol.  Family History:   Family History  Problem Relation Age of Onset   Diabetes Mother    Hypertension Mother    Hyperlipidemia Mother    Diabetes Father    Cancer Father    Hypertension Father    Diabetes Brother    Heart disease Brother    Diabetes Sister    Colon cancer Neg Hx     Blood pressure 116/65, pulse 65, temperature (P) 98.2 F (36.8 C), resp. rate 14, weight 65.8 kg, SpO2 99%. GEN: NAD, A&Ox3, NCAT HEENT: No conjunctival pallor, EOMI NECK: Supple, no thyromegaly LUNGS: CTA B/L no rales, rhonchi or wheezing CV: RRR, No M/R/G ABD: SNDNT +BS  EXT: No lower extremity edema ACCESS: Upper arm AV graft positive thrill along the arterial anastomosis        Ebb Carelock, Alveda Aures, MD 01/27/2024, 7:41 AM

## 2024-01-27 NOTE — Op Note (Signed)
 Patient presents with decreased access flows and prolonged cannulation site bleeding from [facility] on September 11, 2023.  On exam the LUA straight AVG is hyperpulsatile.   Summary:  1) Successful angiogram of a left  upper arm straight AVG   with evidence of a 70-80% venous anastomosis to axillary vein stenosis treated to 10% with a 8 mm Mustang FE ~16 atm.   2) The body of the graft, central veins, and inflow were widely patent. 3) This left  upper arm straight graft remains amenable to future percutaneous intervention. If lesion recurs in < 3months, would favor stent graft placement; of note there is good flow even retrograde through collateral branches in the brachial vein and a stent graft with obliterate the retrograde flow.   Description of procedure: The left  upper arm was prepped and draped in the usual fashion. The left upper arm straight AVG was cannulated (16109) in the arterial limb of the graft in an antegrade direction with an 18G needle and then a 6 Fr sheath was inserted by guidewire exchange technique. The angiogram revealed a patent body of the graft with a 70-80% venous anastomosis stenosis retrograde flow through the brachial vein and sidebranches. The body of the graft, axillary vein, central veins and inflow were patent.  A guidewire was easily advanced past the outflow stenosis and parked in the central veins. I then advanced an 8 x 4 Mustang angioplasty balloon through the antegrade sheath over the guidewire to the level of the venous anastomosis to axillary vein stenosis. Venous angioplasty was performed to 100% balloon effacement with approximately 16ATM of pressure via a hand syringe assembly.   Final arteriogram and completion venogram revealed no evidence of extravasation or dissection, more rapid access flows through the graft and 10% residual stenosis in the venous anastomosis.  Hemostasis: A 3-0 ethilon purse string suture was placed at the cannulation site on removal of  the sheath.  Sedation: 1 mg Versed , 25 mcg Fentanyl .  Sedation time: 6 minutes  Contrast. 6 mL  Monitoring: Because of the patient's comorbid conditions and sedation during the procedure, continuous EKG monitoring and O2 saturation monitoring was performed throughout the procedure by the RN. There were no abnormal arrhythmias encountered.  Complications: None.   Diagnoses: I87.1 Stricture of vein  N18.6 ESRD T82.858A Stricture of access  Procedure Coding:  [phone] Cannulation and angiogram of fistula, venous angioplasty (AVG VA) U9811 Contrast  Recommendations:  1. Continue to cannulate the fistula with 15G needles.  2. Refer back for problems with flows. 3. Remove the suture next treatment.   Discharge: The patient was discharged home in stable condition. The patient was given education regarding the care of the dialysis access AVF and specific instructions in case of any problems.

## 2024-01-27 NOTE — Discharge Instructions (Signed)

## 2024-01-28 ENCOUNTER — Encounter (HOSPITAL_COMMUNITY): Payer: Self-pay | Admitting: Nephrology

## 2024-02-02 ENCOUNTER — Telehealth: Payer: Self-pay

## 2024-02-02 ENCOUNTER — Other Ambulatory Visit (HOSPITAL_COMMUNITY): Payer: Self-pay

## 2024-02-02 NOTE — Telephone Encounter (Signed)
 [facility] Patient Advocate Encounter  Insurance verification completed.    The patient is insured through [employer].     Ran test claim for [id number]   The current 30 day co-pay is $0. LAST FILLED 01/18/24  Ran test claim for SYMTUZA  The current 30 day co-pay is $0.   Ran test claim for DOVATO  The current 30 day co-pay is $0.    We will continue to follow to see if copay assistance is needed.  This test claim was processed through [facility]- copay amounts may vary at other pharmacies due to pharmacy/plan contracts, or as the patient moves through the different stages of their insurance plan.

## 2024-02-05 ENCOUNTER — Ambulatory Visit: Admitting: Internal Medicine

## 2024-02-19 ENCOUNTER — Ambulatory Visit (HOSPITAL_COMMUNITY)
Admission: RE | Admit: 2024-02-19 | Discharge: 2024-02-19 | Disposition: A | Discharge status: Home / Self Care | Attending: Internal Medicine | Admitting: Internal Medicine

## 2024-02-19 ENCOUNTER — Encounter (HOSPITAL_COMMUNITY)
Admission: RE | Disposition: A | Discharge status: Home / Self Care | Payer: Self-pay | Source: Home / Self Care | Attending: Internal Medicine

## 2024-02-19 ENCOUNTER — Other Ambulatory Visit: Payer: Self-pay

## 2024-02-19 ENCOUNTER — Encounter (HOSPITAL_COMMUNITY): Payer: Self-pay | Admitting: Internal Medicine

## 2024-02-19 DIAGNOSIS — N186 End stage renal disease: Secondary | ICD-10-CM | POA: Insufficient documentation

## 2024-02-19 DIAGNOSIS — N250 Renal osteodystrophy: Secondary | ICD-10-CM | POA: Insufficient documentation

## 2024-02-19 DIAGNOSIS — T82848A Pain from vascular prosthetic devices, implants and grafts, initial encounter: Secondary | ICD-10-CM | POA: Diagnosis present

## 2024-02-19 DIAGNOSIS — Z992 Dependence on renal dialysis: Secondary | ICD-10-CM | POA: Diagnosis not present

## 2024-02-19 DIAGNOSIS — D631 Anemia in chronic kidney disease: Secondary | ICD-10-CM | POA: Diagnosis not present

## 2024-02-19 DIAGNOSIS — I120 Hypertensive chronic kidney disease with stage 5 chronic kidney disease or end stage renal disease: Secondary | ICD-10-CM | POA: Diagnosis not present

## 2024-02-19 DIAGNOSIS — Z79899 Other long term (current) drug therapy: Secondary | ICD-10-CM | POA: Insufficient documentation

## 2024-02-19 DIAGNOSIS — Y832 Surgical operation with anastomosis, bypass or graft as the cause of abnormal reaction of the patient, or of later complication, without mention of misadventure at the time of the procedure: Secondary | ICD-10-CM | POA: Diagnosis not present

## 2024-02-19 DIAGNOSIS — E1122 Type 2 diabetes mellitus with diabetic chronic kidney disease: Secondary | ICD-10-CM | POA: Insufficient documentation

## 2024-02-19 HISTORY — PX: A/V SHUNT INTERVENTION: CATH118220

## 2024-02-19 SURGERY — A/V SHUNT INTERVENTION
Anesthesia: LOCAL

## 2024-02-19 MED ORDER — FENTANYL CITRATE (PF) 100 MCG/2ML IJ SOLN
INTRAMUSCULAR | Status: DC | PRN
  Administered 2024-02-19: 50 ug via INTRAVENOUS

## 2024-02-19 MED ORDER — LIDOCAINE HCL (PF) 1 % IJ SOLN
INTRAMUSCULAR | Status: DC | PRN
  Administered 2024-02-19: 2 mL

## 2024-02-19 MED ORDER — LIDOCAINE HCL (PF) 1 % IJ SOLN
INTRAMUSCULAR | Status: AC
Start: 2024-02-19 — End: ?
  Filled 2024-02-19: qty 30

## 2024-02-19 MED ORDER — IODIXANOL 320 MG/ML IV SOLN
INTRAVENOUS | Status: DC | PRN
  Administered 2024-02-19: 8 mL

## 2024-02-19 MED ORDER — MIDAZOLAM HCL 2 MG/2ML IJ SOLN
INTRAMUSCULAR | Status: DC | PRN
  Administered 2024-02-19: 1 mg via INTRAVENOUS

## 2024-02-19 MED ORDER — SODIUM CHLORIDE 0.9 % IV SOLN: INTRAVENOUS | Status: DC

## 2024-02-19 MED ORDER — HEPARIN (PORCINE) IN NACL 2000-0.9 UNIT/L-% IV SOLN
INTRAVENOUS | Status: DC | PRN
  Administered 2024-02-19: 1000 mL

## 2024-02-19 MED ORDER — ACETAMINOPHEN 325 MG PO TABS: 650.0000 mg | ORAL_TABLET | ORAL | Status: DC | PRN

## 2024-02-19 MED ORDER — MIDAZOLAM HCL 2 MG/2ML IJ SOLN
INTRAMUSCULAR | Status: AC
  Filled 2024-02-19: qty 2

## 2024-02-19 MED ORDER — ONDANSETRON HCL 4 MG/2ML IJ SOLN: 4.0000 mg | Freq: Four times a day (QID) | INTRAMUSCULAR | Status: DC | PRN

## 2024-02-19 MED ORDER — FENTANYL CITRATE (PF) 100 MCG/2ML IJ SOLN
INTRAMUSCULAR | Status: AC
  Filled 2024-02-19: qty 2

## 2024-02-19 SURGICAL SUPPLY — 7 items
COVER DOME SNAP 22 D (MISCELLANEOUS) ×1 IMPLANT
GUIDEWIRE ANGLED .035 180CM (WIRE) IMPLANT
SET MICROPUNCTURE 5F STIFF (MISCELLANEOUS) IMPLANT
SHEATH PINNACLE R/O II 6F 4CM (SHEATH) IMPLANT
SHEATH PROBE COVER 6X72 (BAG) ×1 IMPLANT
TRAY PV CATH (CUSTOM PROCEDURE TRAY) ×1 IMPLANT
TUBING CIL FLEX 10 FLL-RA (TUBING) ×1 IMPLANT

## 2024-02-19 NOTE — Discharge Instructions (Signed)

## 2024-02-19 NOTE — Interval H&P Note (Signed)
 History and Physical Interval Note:  02/19/2024 12:22 PM  Michael [name]  has presented today for surgery, with the diagnosis of pain.  The various methods of treatment have been discussed with the patient and family. After consideration of risks, benefits and other options for treatment, the patient has consented to  Procedure(s): A/V SHUNT INTERVENTION (N/A) as a surgical intervention.  The patient's history has been reviewed, patient examined, no change in status, stable for surgery.  I have reviewed the patient's chart and labs.  Questions were answered to the patient's satisfaction.     Baron Border

## 2024-02-19 NOTE — Op Note (Addendum)
 Patient presents with pain in LUE AVG.  It was placed 09/11/23 at [facility].  He had outflow VA PTA here for prolonged bleeding here on 01/27/24.  He is referred back for pain in AVG during dialysis treatments that occurs during the treatment after cannulation is completed.  There appears to be a resolving mild infiltration and possibly some swelling around the AVG vs just thin arm with protuberant AVG.  Radial pulse is 2+ on the left.   Summary:  1) Normal AVG angiogram.   2) The body of the graft, central veins, and inflow were widely patent. 3) This right upper arm straight graft remains amenable to future percutaneous intervention.  Description of procedure: The left upper arm was prepped and draped in the usual fashion. The left upper arm straight AVG was cannulated (91478) in the arterial limb of the graft in an antegrade direction with an 21G micropuncture needle and then a 6 Fr sheath was inserted by guidewire exchange technique. The angiogram revealed a patent body of the graft, central veins and inflow were patent.  The circuit was evaluated in multiple views and flows were rapid with no stenosis or issues identified.    Hemostasis: A 3-0 ethilon purse string suture was placed at the cannulation site on removal of the sheath.  Sedation: 1mg  Versed , 50mcg Fentanyl .  Sedation time: 5 minutes  Contrast. 8 mL  Monitoring: Because of the patient's comorbid conditions and sedation during the procedure, continuous EKG monitoring and O2 saturation monitoring was performed throughout the procedure by the RN. There were no abnormal arrhythmias encountered.  Complications: None.   Diagnoses: N18.6 ESRD T82.848A pain in dialysis access  Procedure Coding:  [id number] Cannulation and angiogram of fistula, venous angioplasty (AVG VA) G9562 Contrast  Recommendations:  1. Continue to cannulate the fistula with 15G needles.  2. Refer back for problems with flows. 3. Remove the suture next treatment.   4.  Would give AVG a few more weeks for the infiltration to resolve but if pain continues will need to discuss with Dr. Austine Blunt next steps, I.e. consider new access potentially.  Discharge: The patient was discharged home in stable condition. The patient was given education regarding the care of the dialysis access AVG and specific instructions in case of any problems.

## 2024-02-26 ENCOUNTER — Other Ambulatory Visit: Payer: Self-pay

## 2024-02-26 ENCOUNTER — Encounter: Payer: Self-pay | Admitting: Internal Medicine

## 2024-02-26 ENCOUNTER — Telehealth: Payer: Self-pay

## 2024-02-26 ENCOUNTER — Ambulatory Visit: Admitting: Infectious Diseases

## 2024-02-26 ENCOUNTER — Ambulatory Visit (INDEPENDENT_AMBULATORY_CARE_PROVIDER_SITE_OTHER): Admitting: Internal Medicine

## 2024-02-26 VITALS — BP 106/73 | HR 74 | Ht 68.0 in | Wt 147.6 lb

## 2024-02-26 DIAGNOSIS — B20 Human immunodeficiency virus [HIV] disease: Secondary | ICD-10-CM | POA: Diagnosis not present

## 2024-02-26 DIAGNOSIS — Z21 Asymptomatic human immunodeficiency virus [HIV] infection status: Secondary | ICD-10-CM

## 2024-02-26 MED ORDER — BICTEGRAVIR-EMTRICITAB-TENOFOV 50-200-25 MG PO TABS: 1.0000 | ORAL_TABLET | Freq: Every day | ORAL | 5 refills | Status: AC

## 2024-02-26 MED ORDER — BICTEGRAVIR-EMTRICITAB-TENOFOV 50-200-25 MG PO TABS: 1.0000 | ORAL_TABLET | Freq: Every day | ORAL | 5 refills | Status: DC

## 2024-02-26 NOTE — Telephone Encounter (Signed)
 Called [facility] [place] [phone]. Requested Fax number and advised that [facility] was unable to draw blood today(hard stick). Dr. Zelda Hickman is requesting that we add orders to labs he would get at next lab visit with dialysis. Spoke to [employer] who agreed and will fax us  results. She will also note any labs that they could not collect at that facility.   Faxed and scanned  signed order sheet with the following lab orders to Fax [phone] :  CBC W/Diff  CMP W/out GFR  Hepatitis A- Antibody Total Hepatitis C Antibody  HIV RNA Quant No Reflex bld RPR T Helper Cells (CD4)

## 2024-02-26 NOTE — Progress Notes (Unsigned)
 [facility]     HPI: Michael [name] is a 73 y.o. male presents for HIV management. Today @DATE @: Discussed the use of AI scribe software for clinical note transcription with the patient, who gave verbal consent to proceed.  History of Present Illness     Date of diagnosis ART exposure Past OIs Risk factors: MSM, IVDA, congenital  Partners in last 2months***, in the last 12 months***.  Anal sex receptive***, insertive***. Contraception**** Oral sex, contraception*** Vaginal penile sex, contraception***  Social: Occupation: Housing: Support: Understanding of HIV: Etoh/drug/tobacco use:  Past Medical History:  Diagnosis Date   A-fib ([name])    Anemia    Asthma    No PFTs, history of childhood asthma   CAD (coronary artery disease)    Cellulitis 04/2014   left facial   Cellulitis and abscess of toe of right foot 12/08/2019   Chondromalacia of medial femoral condyle    Left knee MRI 04/28/12: Chondromalacia of the medial femoral condyle with slight peripheral degeneration of the meniscocapsular junction of the medial meniscus; followed by sports medicine   Chronic kidney failure, stage 4 (severe) ([name])    Collagen vascular disease ([name])    Crack cocaine use    for 20+ years, has been enrolled in detox programs in the past   Depression    with history of hospitalization for suicidal ideation   Diabetes mellitus 2002   Diagnosed in 2002, started insulin  in 2012   Gout    Headache(784.0)    CT head 08/2011: Periventricular and subcortical white matter hypodensities are most in keeping with chronic microangiopathic change   HIV infection ([name]) 08/2011   Followed by Dr. Alwin Baars   Hyperlipidemia    Hypertension    Pulmonary embolism [facility])     Past Surgical History:  Procedure Laterality Date   A/V SHUNT INTERVENTION N/A 01/27/2024   Procedure: A/V SHUNT INTERVENTION;  Surgeon: Patrick Boor, MD;  Location: [facility];   Service: Cardiovascular;  Laterality: N/A;   A/V SHUNT INTERVENTION N/A 02/19/2024   Procedure: A/V SHUNT INTERVENTION;  Surgeon: Baron Border, MD;  Location: [facility];  Service: Cardiovascular;  Laterality: N/A;   AMPUTATION Right 07/21/2019   Procedure: RIGHT SECOND TOE AMPUTATION;  Surgeon: Timothy Ford, MD;  Location: [facility] OR;  Service: Orthopedics;  Laterality: Right;   BACK SURGERY     1988   BIOPSY  03/20/2023   Procedure: BIOPSY;  Surgeon: Annis Kinder, DO;  Location: [facility] ENDOS[facility];  Service: Gastroenterology;;   BOWEL RESECTION     CARDIAC SURGERY     CERVICAL SPINE SURGERY     " rods in my neck "   COLONOSCOPY WITH PROPOFOL  N/A 03/20/2023   Procedure: COLONOSCOPY WITH PROPOFOL ;  Surgeon: Annis Kinder, DO;  Location: [facility];  Service: Gastroenterology;  Laterality: N/A;   CORONARY ARTERY BYPASS GRAFT     CORONARY STENT PLACEMENT     ESOPHAGOGASTRODUODENOSCOPY (EGD) WITH PROPOFOL  N/A 03/20/2023   Procedure: ESOPHAGOGASTRODUODENOSCOPY (EGD) WITH PROPOFOL ;  Surgeon: Annis Kinder, DO;  Location: [facility];  Service: Gastroenterology;  Laterality: N/A;   NM MYOCAR PERF WALL MOTION  12/27/2011   normal   POLYPECTOMY  03/20/2023   Procedure: POLYPECTOMY;  Surgeon: Annis Kinder, DO;  Location: [facility];  Service: Gastroenterology;;   SPINE SURGERY     VENOUS ANGIOPLASTY  01/27/2024   Procedure: VENOUS ANGIOPLASTY;  Surgeon: Patrick Boor,  MD;  Location: [facility];  Service: Cardiovascular;;  70 % VA    Family History  Problem Relation Age of Onset   Diabetes Mother    Hypertension Mother    Hyperlipidemia Mother    Diabetes Father    Cancer Father    Hypertension Father    Diabetes Brother    Heart disease Brother    Diabetes Sister    Colon cancer Neg Hx    Current Outpatient Medications on File Prior to Visit  Medication Sig Dispense Refill   acetaminophen  (TYLENOL ) 325 MG tablet Take 650 mg by mouth every 6 (six)  hours as needed (pain/fever).     albuterol  (VENTOLIN  HFA) 108 (90 Base) MCG/ACT inhaler Inhale 1 puff into the lungs every 4 (four) hours as needed for wheezing or shortness of breath.     atorvastatin  (LIPITOR) 40 MG tablet Take 40 mg by mouth at bedtime. (2100)     bictegravir-emtricitabine -tenofovir  AF (BIKTARVY ) 50-200-25 MG TABS tablet Take 1 tablet by mouth daily. (Patient taking differently: Take 1 tablet by mouth at bedtime. (2100)) 30 tablet 2   clopidogrel  (PLAVIX ) 75 MG tablet Take 75 mg by mouth at bedtime. (2100)     diltiazem  (CARDIZEM ) 120 MG tablet Take 120 mg by mouth at bedtime. (2100)     famotidine  (PEPCID ) 20 MG tablet Take 20 mg by mouth at bedtime. (2100)     FLUoxetine  (PROZAC ) 20 MG capsule Take 20 mg by mouth at bedtime. (2100)     fluticasone  furoate-vilanterol (BREO ELLIPTA ) 100-25 MCG/ACT AEPB Inhale 1 puff into the lungs daily. (0900)     gabapentin  (NEURONTIN ) 300 MG capsule Take 300 mg by mouth at bedtime. (2100)     magnesium  hydroxide (MILK OF MAGNESIA) 400 MG/5ML suspension Take 30 mLs by mouth daily as needed (constipation (no bm in 3 days)).     multivitamin (RENA-VIT) TABS tablet Take 1 tablet by mouth in the morning. (0900)     nitroGLYCERIN  (NITROSTAT ) 0.4 MG SL tablet Place 1 tablet (0.4 mg total) under the tongue every 5 (five) minutes as needed for chest pain. 25 tablet 0   ondansetron  (ZOFRAN ) 4 MG tablet Take 4 mg by mouth every 4 (four) hours as needed for nausea or vomiting.     senna-docusate (SENOKOT-S) 8.6-50 MG tablet Take 2 tablets by mouth at bedtime. (2100)     sevelamer  carbonate (RENVELA ) 800 MG tablet Take 800 mg by mouth 3 (three) times daily with meals. (0800, 1100 & 1600)     Tiotropium Bromide Monohydrate (SPIRIVA RESPIMAT) 2.5 MCG/ACT AERS Inhale 1 puff into the lungs in the morning. (0900)     Vitamin D, Ergocalciferol, (DRISDOL) 1.25 MG (50000 UNIT) CAPS capsule Take 50,000 Units by mouth every Friday. (2100)     traZODone  (DESYREL )  50 MG tablet Take 50 mg by mouth at bedtime. (2100)     No current facility-administered medications on file prior to visit.    No Known Allergies    Lab Results HIV 1 RNA Quant  Date Value  03/08/2023 100 copies/mL  09/18/2022 <20 copies/mL  04/25/2021 <20 Copies/mL (H)   CD4 T Cell Abs (/uL)  Date Value  02/14/2023 203 (L)  09/12/2022 296 (L)  04/25/2021 425   No results found for: "HIV1GENOSEQ" Lab Results  Component Value Date   WBC 6.3 12/31/2023   HGB 11.2 (L) 12/31/2023   HCT 33.0 (L) 12/31/2023   MCV 97.7 12/31/2023   PLT 135 (L) 12/31/2023  Lab Results  Component Value Date   CREATININE 4.80 (H) 12/31/2023   BUN 18 12/31/2023   NA 138 12/31/2023   K 3.4 (L) 12/31/2023   CL 95 (L) 12/31/2023   CO2 31 12/31/2023   Lab Results  Component Value Date   ALT 14 12/31/2023   AST 18 12/31/2023   ALKPHOS 69 12/31/2023   BILITOT 0.4 12/31/2023    Lab Results  Component Value Date   CHOL 149 04/25/2021   TRIG 54 01/17/2023   HDL 57 04/25/2021   LDLCALC 68 04/25/2021   Lab Results  Component Value Date   HAV NON-REACTIVE 04/25/2021   Lab Results  Component Value Date   HEPBSAG NON REACTIVE 09/12/2022   HEPBSAB NON REACTIVE 09/11/2022   Lab Results  Component Value Date   HCVAB NON REACTIVE 09/12/2022   Lab Results  Component Value Date   CHLAMYDIAWP Negative 09/11/2022   N Negative 09/11/2022   No results found for: "GCPROBEAPT" No results found for: "QUANTGOLD"  Assessment/Plan #HIV Results   -CD4***, VL***, on ***  Assessment and Plan Assessment & Plan       #Vaccination COVID Flu Monkeypox PCV-13 on 01/22/20 Meningitis HepA x2  HEpB x3 dose serreis Tdap 2024 Shingles  #Health maintenance -Quantiferon -RPR -HCV -GC -Lipid -Dysplasia screen F/M -Mammogram  -Colonoscopy    Orlie Bjornstad, MD [facility]

## 2024-02-26 NOTE — Patient Instructions (Signed)
Mental Health Resources  988: can call or text 24/7  [facility]: Address: [address], [place], [place] [address] Open 24 hours Phone: [phone]  [facility]: Address: [address], [place], [place] [address] Phone: [phone] Appointments: [email or web address]

## 2024-03-05 NOTE — Progress Notes (Signed)
 The ASCVD Risk score (Arnett DK, et al., 2019) failed to calculate for the following reasons:   Risk score cannot be calculated because patient has a medical history suggesting prior/existing ASCVD  Arlon Bergamo, BSN, RN

## 2024-06-14 NOTE — ED Notes (Signed)
 Report called to [facility] rehab Debby (nurse) took report.    Michael Miquel Lesches, RN 06/14/24 [phone]

## 2024-06-18 ENCOUNTER — Other Ambulatory Visit: Payer: Self-pay

## 2024-06-18 ENCOUNTER — Emergency Department (HOSPITAL_COMMUNITY)
Admission: EM | Admit: 2024-06-18 | Discharge: 2024-06-18 | Disposition: A | Discharge status: Skilled Nursing Facility | Attending: Emergency Medicine | Admitting: Emergency Medicine

## 2024-06-18 ENCOUNTER — Encounter (HOSPITAL_COMMUNITY): Payer: Self-pay | Admitting: Emergency Medicine

## 2024-06-18 ENCOUNTER — Emergency Department (HOSPITAL_COMMUNITY)

## 2024-06-18 DIAGNOSIS — Z7902 Long term (current) use of antithrombotics/antiplatelets: Secondary | ICD-10-CM | POA: Diagnosis not present

## 2024-06-18 DIAGNOSIS — M79605 Pain in left leg: Secondary | ICD-10-CM | POA: Diagnosis present

## 2024-06-18 DIAGNOSIS — Z992 Dependence on renal dialysis: Secondary | ICD-10-CM | POA: Diagnosis not present

## 2024-06-18 DIAGNOSIS — Z7951 Long term (current) use of inhaled steroids: Secondary | ICD-10-CM | POA: Diagnosis not present

## 2024-06-18 DIAGNOSIS — E1122 Type 2 diabetes mellitus with diabetic chronic kidney disease: Secondary | ICD-10-CM | POA: Insufficient documentation

## 2024-06-18 DIAGNOSIS — Z79899 Other long term (current) drug therapy: Secondary | ICD-10-CM | POA: Insufficient documentation

## 2024-06-18 DIAGNOSIS — I2510 Atherosclerotic heart disease of native coronary artery without angina pectoris: Secondary | ICD-10-CM | POA: Insufficient documentation

## 2024-06-18 DIAGNOSIS — Z955 Presence of coronary angioplasty implant and graft: Secondary | ICD-10-CM | POA: Insufficient documentation

## 2024-06-18 DIAGNOSIS — Z5900 Homelessness unspecified: Secondary | ICD-10-CM | POA: Diagnosis not present

## 2024-06-18 DIAGNOSIS — Z21 Asymptomatic human immunodeficiency virus [HIV] infection status: Secondary | ICD-10-CM | POA: Insufficient documentation

## 2024-06-18 DIAGNOSIS — J449 Chronic obstructive pulmonary disease, unspecified: Secondary | ICD-10-CM | POA: Diagnosis not present

## 2024-06-18 DIAGNOSIS — I5030 Unspecified diastolic (congestive) heart failure: Secondary | ICD-10-CM | POA: Diagnosis not present

## 2024-06-18 DIAGNOSIS — I132 Hypertensive heart and chronic kidney disease with heart failure and with stage 5 chronic kidney disease, or end stage renal disease: Secondary | ICD-10-CM | POA: Insufficient documentation

## 2024-06-18 DIAGNOSIS — N186 End stage renal disease: Secondary | ICD-10-CM | POA: Diagnosis not present

## 2024-06-18 LAB — CBC WITH DIFFERENTIAL/PLATELET
Abs Immature Granulocytes: 0.02 K/uL (ref 0.00–0.07)
Basophils Absolute: 0 K/uL (ref 0.0–0.1)
Basophils Relative: 1 %
Eosinophils Absolute: 0.1 K/uL (ref 0.0–0.5)
Eosinophils Relative: 3 %
HCT: 37.2 % — ABNORMAL LOW (ref 39.0–52.0)
Hemoglobin: 11.6 g/dL — ABNORMAL LOW (ref 13.0–17.0)
Immature Granulocytes: 1 %
Lymphocytes Relative: 28 %
Lymphs Abs: 1 K/uL (ref 0.7–4.0)
MCH: 33.4 pg (ref 26.0–34.0)
MCHC: 31.2 g/dL (ref 30.0–36.0)
MCV: 107.2 fL — ABNORMAL HIGH (ref 80.0–100.0)
Monocytes Absolute: 0.6 K/uL (ref 0.1–1.0)
Monocytes Relative: 16 %
Neutro Abs: 1.9 K/uL (ref 1.7–7.7)
Neutrophils Relative %: 51 %
Platelets: 132 K/uL — ABNORMAL LOW (ref 150–400)
RBC: 3.47 MIL/uL — ABNORMAL LOW (ref 4.22–5.81)
RDW: 17.1 % — ABNORMAL HIGH (ref 11.5–15.5)
WBC: 3.7 K/uL — ABNORMAL LOW (ref 4.0–10.5)
nRBC: 0 % (ref 0.0–0.2)

## 2024-06-18 LAB — COMPREHENSIVE METABOLIC PANEL WITH GFR
ALT: 13 U/L (ref 0–44)
AST: 26 U/L (ref 15–41)
Albumin: 3.7 g/dL (ref 3.5–5.0)
Alkaline Phosphatase: 74 U/L (ref 38–126)
Anion gap: 18 — ABNORMAL HIGH (ref 5–15)
BUN: 32 mg/dL — ABNORMAL HIGH (ref 8–23)
CO2: 24 mmol/L (ref 22–32)
Calcium: 8.4 mg/dL — ABNORMAL LOW (ref 8.9–10.3)
Chloride: 96 mmol/L — ABNORMAL LOW (ref 98–111)
Creatinine, Ser: 7.78 mg/dL — ABNORMAL HIGH (ref 0.61–1.24)
GFR, Estimated: 7 mL/min — ABNORMAL LOW (ref >60)
Glucose, Bld: 138 mg/dL — ABNORMAL HIGH (ref 70–99)
Potassium: 4.6 mmol/L (ref 3.5–5.1)
Sodium: 138 mmol/L (ref 135–145)
Total Bilirubin: 0.6 mg/dL (ref 0.0–1.2)
Total Protein: 7.5 g/dL (ref 6.5–8.1)

## 2024-06-18 MED ORDER — OXYCODONE-ACETAMINOPHEN 5-325 MG PO TABS: 1.0000 | ORAL_TABLET | Freq: Once | ORAL | Status: DC

## 2024-06-18 NOTE — Discharge Instructions (Addendum)
 We evaluated you for your leg pain.  We did not see any dangerous causes of your leg pain in the emergency department.  Your x-ray was negative and your laboratory testing was reassuring.  Please take 1000 mg of tylenol  every 6 hours as needed for pain.   Please follow-up closely with your primary doctor.  If you develop any new or worsening symptoms such as worsening pain, color change in your leg, leg swelling, chest pain, difficulty breathing, fevers or chills, or any other new symptoms, please return to the emergency department.

## 2024-06-18 NOTE — ED Triage Notes (Signed)
 Pt in from [facility] via [facility] with reported L leg pain and weakness onset yesterday morning sometime. Pt states yesterday, he woke up with pain to the back L thigh and R anterior knee, along with weakness to the extremity. Reported hx of gait stability and problems with the L knee giving out. Pt also has MWF dialysis, last session yesterday. VS w/EMS: 118/68 61HR 100%RA CBG 137

## 2024-06-18 NOTE — ED Provider Notes (Signed)
 [facility] Provider Note  CSN: [id number] Arrival date & time: 06/18/24 0016  Chief Complaint(s) Leg Pain and Weakness  HPI Michael [name] is a 73 y.o. male history of HIV, A-fib, CKD on dialysis, diabetes, coronary artery disease presenting to the emergency department with left leg pain.  Patient reports that he has had left leg pain for around 2 weeks.  Denies any trauma or fall.  Reports he has had pain in this leg before.  Denies any swelling in the leg.  He reports the pain is primarily in his left toe.  No wounds.  No recent travel or surgery.  No rashes.  No back pain.  Reported some weakness initially but reports this is because it hurts.  Has not taken anything for pain.   Past Medical History Past Medical History:  Diagnosis Date   A-fib ([name])    Anemia    Asthma    No PFTs, history of childhood asthma   CAD (coronary artery disease)    Cellulitis 04/2014   left facial   Cellulitis and abscess of toe of right foot 12/08/2019   Chondromalacia of medial femoral condyle    Left knee MRI 04/28/12: Chondromalacia of the medial femoral condyle with slight peripheral degeneration of the meniscocapsular junction of the medial meniscus; followed by sports medicine   Chronic kidney failure, stage 4 (severe) ([name])    Collagen vascular disease ([name])    Crack cocaine use    for 20+ years, has been enrolled in detox programs in the past   Depression    with history of hospitalization for suicidal ideation   Diabetes mellitus 2002   Diagnosed in 2002, started insulin  in 2012   Gout    Headache(784.0)    CT head 08/2011: Periventricular and subcortical white matter hypodensities are most in keeping with chronic microangiopathic change   HIV infection ([name]) 08/2011   Followed by Dr. Eben   Hyperlipidemia    Hypertension    Pulmonary embolism [facility])    Patient Active Problem List   Diagnosis Date Noted   Adenomatous polyp of  descending colon 03/20/2023   Grade I internal hemorrhoids 03/20/2023   Gastritis and gastroduodenitis 03/20/2023   Chronic constipation 03/18/2023   Change in bowel habits 03/18/2023   Nausea without vomiting 03/18/2023   Heartburn 03/18/2023   Acute metabolic encephalopathy 03/07/2023   CAP (community acquired pneumonia) 02/14/2023   Acute pancreatitis 01/17/2023   AKI (acute kidney injury) ([name]) 12/12/2022   Generalized abdominal pain 10/12/2022   Hyperkalemia 10/05/2022   CKD (chronic kidney disease), stage IV ([name]) 09/23/2022   Polysubstance abuse ([name]) 09/13/2022   ARF (acute renal failure) ([name]) 09/09/2022   (HFpEF) heart failure with preserved ejection fraction ([name]) 09/09/2022   Hypertensive urgency 12/09/2021   Impaired functional mobility, balance, and endurance 11/29/2021   Dilated cardiomyopathy ([name]) 11/29/2021   Arthritis 11/29/2021   Diabetic foot infection ([name]) 06/20/2021   Presence of aortocoronary bypass graft 04/16/2021   Iron deficiency anemia 04/04/2021   Atypical chest pain 04/03/2021   Anemia of chronic disease 12/14/2020   DMII (diabetes mellitus, type 2) ([name]) 08/14/2020   Idiopathic chronic gout of multiple sites without tophus 02/17/2020   Diabetic ulcer of toe of right foot associated with type 2 diabetes mellitus ([name]) 12/17/2019   Vitamin B12 deficiency 12/17/2019   Homelessness 12/13/2019   Cellulitis 12/07/2019   Osteomyelitis ([name]) 07/17/2019   Abscess or cellulitis of toe, right  Toe pain, right-second    MDD (major depressive disorder), recurrent episode, severe ([name]) 06/15/2019   Respiratory distress 04/12/2019   Pneumonia due to severe acute respiratory syndrome coronavirus 2 (SARS-CoV-2) 04/11/2019   COVID-19 virus infection 03/19/2019   Chest pain 10/03/2018   Malnutrition of moderate degree 09/21/2018   MDD (major depressive disorder), recurrent episode, moderate ([name])    Type II diabetes mellitus with renal manifestations ([name])  05/04/2018   GERD (gastroesophageal reflux disease) 05/04/2018   S/P CABG (coronary artery bypass graft)    Left testicular pain    Depression 02/20/2018   Epididymo-orchitis, acute 02/20/2018   Essential hypertension 02/20/2018   Precordial chest pain 02/20/2018   Acute kidney injury superimposed on chronic kidney disease ([name]) 02/14/2018   Pneumonia of right lung due to infectious organism 02/14/2018   History of pulmonary embolism 07/04/2017   Acute hyponatremia 05/11/2017   Hyperglycemia due to type 2 diabetes mellitus ([name]) 05/11/2017   CHF (congestive heart failure) ([name]) 04/01/2017   Hepatitis C 12/30/2016   Substance induced mood disorder ([name]) 11/23/2016   Chronic ischemic heart disease 11/12/2016   Paroxysmal A-fib ([name]) 11/12/2016   Back pain 04/18/2016   S/P carotid endarterectomy 11/15/2015   COPD (chronic obstructive pulmonary disease) ([name]) 10/22/2015   Stenosis of cervical spine with myelopathy ([name]) 10/20/2015   MDD (major depressive disorder), recurrent severe, without psychosis ([name]) 09/09/2015   History of fusion of cervical spine 08/28/2015   Cocaine use disorder, severe, dependence ([name])    Cocaine abuse with cocaine-induced mood disorder ([name]) 08/14/2015   Gout 07/10/2015   Acute renal failure superimposed on stage 3 chronic kidney disease ([name]) 03/06/2015   Chronic kidney disease, stage III (moderate) ([name]) 03/06/2015   Encounter for general adult medical examination with abnormal findings 02/09/2015   Major depressive disorder, recurrent, severe without psychotic features ([name])    Suicidal ideations 08/15/2014   Cervicalgia 06/28/2014   Lumbar radiculopathy, chronic 06/28/2014   Asthma, chronic 02/03/2014   S/P percutaneous transluminal coronary angioplasty 10/15/2013   3-vessel CAD 06/24/2013   ED (erectile dysfunction) of organic origin 07/07/2012   Hypertension goal BP (blood pressure) < 140/80 04/29/2012   Chondromalacia of left knee 03/19/2012    Hyperlipidemia with target LDL less than 100 02/12/2012   Fibromyalgia 02/12/2012   Pure hypercholesterolemia 02/12/2012   Cocaine use disorder ([name]) 01/10/2012    Class: Acute   Human immunodeficiency virus (HIV) disease ([name]) 12/16/2011   HIV (human immunodeficiency virus infection) ([name]) 08/27/2011   Uncontrolled type 2 diabetes with neuropathy 10/17/2000   Home Medication(s) Prior to Admission medications   Medication Sig Start Date End Date Taking? Authorizing Provider  acetaminophen  (TYLENOL ) 325 MG tablet Take 650 mg by mouth every 6 (six) hours as needed (pain/fever).    [provider]  albuterol  (VENTOLIN  HFA) 108 (90 Base) MCG/ACT inhaler Inhale 1 puff into the lungs every 4 (four) hours as needed for wheezing or shortness of breath.    [provider]  atorvastatin  (LIPITOR) 40 MG tablet Take 40 mg by mouth at bedtime. (2100)    [provider]  bictegravir-emtricitabine -tenofovir  AF (BIKTARVY ) 50-200-25 MG TABS tablet Take 1 tablet by mouth daily. Try to take at the same time each day with or without food. 02/26/24   Dennise Kingsley, MD  clopidogrel  (PLAVIX ) 75 MG tablet Take 75 mg by mouth at bedtime. (2100)    [provider]  diltiazem  (CARDIZEM ) 120 MG tablet Take 120 mg by mouth at bedtime. (2100) 01/17/24  [provider]  famotidine  (PEPCID ) 20 MG tablet Take 20 mg by mouth at bedtime. (2100)    [provider]  FLUoxetine  (PROZAC ) 20 MG capsule Take 20 mg by mouth at bedtime. (2100)    [provider]  fluticasone  furoate-vilanterol (BREO ELLIPTA ) 100-25 MCG/ACT AEPB Inhale 1 puff into the lungs daily. (0900)    [provider]  gabapentin  (NEURONTIN ) 300 MG capsule Take 300 mg by mouth at bedtime. (2100)    [provider]  magnesium  hydroxide (MILK OF MAGNESIA) 400 MG/5ML suspension Take 30 mLs by mouth daily as needed (constipation (no bm in 3 days)).    [provider]  multivitamin  (RENA-VIT) TABS tablet Take 1 tablet by mouth in the morning. (0900)    [provider]  nitroGLYCERIN  (NITROSTAT ) 0.4 MG SL tablet Place 1 tablet (0.4 mg total) under the tongue every 5 (five) minutes as needed for chest pain. 02/19/23   Lue Elsie BROCKS, MD  ondansetron  (ZOFRAN ) 4 MG tablet Take 4 mg by mouth every 4 (four) hours as needed for nausea or vomiting.    [provider]  senna-docusate (SENOKOT-S) 8.6-50 MG tablet Take 2 tablets by mouth at bedtime. (2100)    [provider]  sevelamer  carbonate (RENVELA ) 800 MG tablet Take 800 mg by mouth 3 (three) times daily with meals. (0800, 1100 & 1600)    [provider]  Tiotropium Bromide Monohydrate (SPIRIVA RESPIMAT) 2.5 MCG/ACT AERS Inhale 1 puff into the lungs in the morning. (0900)    [provider]  traZODone  (DESYREL ) 50 MG tablet Take 50 mg by mouth at bedtime. (2100)    [provider]  Vitamin D, Ergocalciferol, (DRISDOL) 1.25 MG (50000 UNIT) CAPS capsule Take 50,000 Units by mouth every Friday. (2100)    [provider]                                                                                                                                    Past Surgical History Past Surgical History:  Procedure Laterality Date   A/V SHUNT INTERVENTION N/A 01/27/2024   Procedure: A/V SHUNT INTERVENTION;  Surgeon: Melia Lynwood ORN, MD;  Location: [facility] INVASIVE CV LAB;  Service: Cardiovascular;  Laterality: N/A;   A/V SHUNT INTERVENTION N/A 02/19/2024   Procedure: A/V SHUNT INTERVENTION;  Surgeon: Norine Manuelita LABOR, MD;  Location: [facility] INVASIVE CV LAB;  Service: Cardiovascular;  Laterality: N/A;   AMPUTATION Right 07/21/2019   Procedure: RIGHT SECOND TOE AMPUTATION;  Surgeon: Harden Jerona GAILS, MD;  Location: [facility] OR;  Service: Orthopedics;  Laterality: Right;   BACK SURGERY     1988   BIOPSY  03/20/2023   Procedure: BIOPSY;  Surgeon: San Sandor GAILS, DO;  Location: [facility] ENDOSCOPY;   Service: Gastroenterology;;   BOWEL RESECTION     CARDIAC SURGERY     CERVICAL SPINE SURGERY      rods in my  neck    COLONOSCOPY WITH PROPOFOL  N/A 03/20/2023   Procedure: COLONOSCOPY WITH PROPOFOL ;  Surgeon: San Sandor GAILS, DO;  Location: [facility] ENDOSCOPY;  Service: Gastroenterology;  Laterality: N/A;   CORONARY ARTERY BYPASS GRAFT     CORONARY STENT PLACEMENT     ESOPHAGOGASTRODUODENOSCOPY (EGD) WITH PROPOFOL  N/A 03/20/2023   Procedure: ESOPHAGOGASTRODUODENOSCOPY (EGD) WITH PROPOFOL ;  Surgeon: San Sandor GAILS, DO;  Location: [facility] ENDOSCOPY;  Service: Gastroenterology;  Laterality: N/A;   NM MYOCAR PERF WALL MOTION  12/27/2011   normal   POLYPECTOMY  03/20/2023   Procedure: POLYPECTOMY;  Surgeon: San Sandor GAILS, DO;  Location: [facility] ENDOSCOPY;  Service: Gastroenterology;;   SPINE SURGERY     VENOUS ANGIOPLASTY  01/27/2024   Procedure: VENOUS ANGIOPLASTY;  Surgeon: Melia Lynwood ORN, MD;  Location: [facility] INVASIVE CV LAB;  Service: Cardiovascular;;  70 % VA   Family History Family History  Problem Relation Age of Onset   Diabetes Mother    Hypertension Mother    Hyperlipidemia Mother    Diabetes Father    Cancer Father    Hypertension Father    Diabetes Brother    Heart disease Brother    Diabetes Sister    Colon cancer Neg Hx     Social History Social History   Tobacco Use   Smoking status: Never   Smokeless tobacco: Never  Vaping Use   Vaping status: Never Used  Substance Use Topics   Alcohol use: No    Alcohol/week: 4.0 standard drinks of alcohol    Types: 2 Cans of beer, 2 Shots of liquor per week   Drug use: Not Currently    Frequency: 4.0 times per week    Types: Crack cocaine, Cocaine    Comment: Recent use of crack   Allergies Patient has no known allergies.  Review of Systems Review of Systems  All other systems reviewed and are negative.   Physical Exam Vital Signs  I have reviewed the triage vital signs BP (!) 161/77   Pulse 63   Temp 97.6 F  (36.4 C)   Resp 14   Wt 67 kg   SpO2 100%   BMI 22.46 kg/m  Physical Exam Vitals and nursing note reviewed.  Constitutional:      General: He is not in acute distress.    Appearance: Normal appearance.  HENT:     Mouth/Throat:     Mouth: Mucous membranes are moist.  Eyes:     Conjunctiva/sclera: Conjunctivae normal.  Cardiovascular:     Rate and Rhythm: Normal rate and regular rhythm.  Pulmonary:     Effort: Pulmonary effort is normal. No respiratory distress.     Breath sounds: Normal breath sounds.  Abdominal:     General: Abdomen is flat.     Palpations: Abdomen is soft.     Tenderness: There is no abdominal tenderness.  Musculoskeletal:     Right lower leg: No edema.     Left lower leg: No edema.     Comments: 1+ bilateral DP pulse, symmetric.  Extremities warm and well-perfused.  No calf tenderness.  No edema.  No deformity or focal tenderness.  No wounds to the feet or extremities  Skin:    General: Skin is warm and dry.     Capillary Refill: Capillary refill takes less than 2 seconds.  Neurological:     Mental Status: He is alert and oriented to person, place, and time. Mental status is at baseline.     Comments: Strength  5 out of 5 in the bilateral lower extremities.  Strength 5 out of 5 in bilateral upper extremities.  No cranial nerve deficit.  Psychiatric:        Mood and Affect: Mood normal.        Behavior: Behavior normal.     ED Results and Treatments Labs (all labs ordered are listed, but only abnormal results are displayed) Labs Reviewed  CBC WITH DIFFERENTIAL/PLATELET - Abnormal; Notable for the following components:      Result Value   WBC 3.7 (*)    RBC 3.47 (*)    Hemoglobin 11.6 (*)    HCT 37.2 (*)    MCV 107.2 (*)    RDW 17.1 (*)    Platelets 132 (*)    All other components within normal limits  COMPREHENSIVE METABOLIC PANEL WITH GFR - Abnormal; Notable for the following components:   Chloride 96 (*)    Glucose, Bld 138 (*)    BUN 32  (*)    Creatinine, Ser 7.78 (*)    Calcium  8.4 (*)    GFR, Estimated 7 (*)    Anion gap 18 (*)    All other components within normal limits  PROTIME-INR                                                                                                                          Radiology DG Knee 2 Views Left Result Date: 06/18/2024 CLINICAL DATA:  Knee pain EXAM: LEFT KNEE - 1-2 VIEW COMPARISON:  None Available. FINDINGS: No acute bony abnormality. Specifically, no fracture, subluxation, or dislocation. Slight joint space narrowing in the medial compartment. No joint effusion. IMPRESSION: No acute bony abnormality. Electronically Signed   By: Franky Crease M.D.   On: 06/18/2024 01:50    Pertinent labs & imaging results that were available during my care of the patient were reviewed by me and considered in my medical decision making (see MDM for details).  Medications Ordered in ED Medications  oxyCODONE -acetaminophen  (PERCOCET/ROXICET) 5-325 MG per tablet 1 tablet (has no administration in time range)                                                                                                                                     Procedures Procedures  (including critical care time)  Medical Decision Making / ED Course   MDM:  73 year old presenting to  the emergency department with leg pain.  Patient overall well-appearing, physical examination without focal abnormality.  Left leg with intact distal pulses, no edema, no wound, no focal tenderness, full range of motion throughout all joints.  Unclear cause of pain.  He reports is primarily in his great toe but also on the rest of his leg.  He does not have any wound or signs of other dangerous process.  Considered process such as DVT but no edema, warmth, focal tenderness in the calf.  He also reported right knee pain in triage earlier but currently denied right knee pain.  X-ray was obtained by triage provider for of the knee which  shows no acute process.  Low concern for dangerous process such as acute arterial occlusion, DVT, cellulitis, occult infection, septic joint, fracture. ongoing  Weakness mentioned in triage note but patient denies to me and no weakness on exam. Feel patient is stable for outpatient follow up. Will discharge patient to home. All questions answered. Patient comfortable with plan of discharge. Return precautions discussed with patient and specified on the after visit summary.       Additional history obtained: -Additional history obtained from ems -External records from outside source obtained and reviewed including: Chart review including previous notes, labs, imaging, consultation notes including duringprior notes   Lab Tests: -I ordered, reviewed, and interpreted labs.   The pertinent results include:   Labs Reviewed  CBC WITH DIFFERENTIAL/PLATELET - Abnormal; Notable for the following components:      Result Value   WBC 3.7 (*)    RBC 3.47 (*)    Hemoglobin 11.6 (*)    HCT 37.2 (*)    MCV 107.2 (*)    RDW 17.1 (*)    Platelets 132 (*)    All other components within normal limits  COMPREHENSIVE METABOLIC PANEL WITH GFR - Abnormal; Notable for the following components:   Chloride 96 (*)    Glucose, Bld 138 (*)    BUN 32 (*)    Creatinine, Ser 7.78 (*)    Calcium  8.4 (*)    GFR, Estimated 7 (*)    Anion gap 18 (*)    All other components within normal limits  PROTIME-INR    Notable for findings of ESRD     Imaging Studies ordered: I ordered imaging studies including XR knee On my interpretation imaging demonstrates arthritic changes  I independently visualized and interpreted imaging. I agree with the radiologist interpretation   Medicines ordered and prescription drug management: Meds ordered this encounter  Medications   oxyCODONE -acetaminophen  (PERCOCET/ROXICET) 5-325 MG per tablet 1 tablet    Refill:  0    -I have reviewed the patients home medicines and have  made adjustments as needed   Reevaluation: After the interventions noted above, I reevaluated the patient and found that their symptoms have improved  Co morbidities that complicate the patient evaluation  Past Medical History:  Diagnosis Date   A-fib ([name])    Anemia    Asthma    No PFTs, history of childhood asthma   CAD (coronary artery disease)    Cellulitis 04/2014   left facial   Cellulitis and abscess of toe of right foot 12/08/2019   Chondromalacia of medial femoral condyle    Left knee MRI 04/28/12: Chondromalacia of the medial femoral condyle with slight peripheral degeneration of the meniscocapsular junction of the medial meniscus; followed by sports medicine   Chronic kidney failure, stage 4 (severe) ([name])    Collagen vascular  disease ([name])    Crack cocaine use    for 20+ years, has been enrolled in detox programs in the past   Depression    with history of hospitalization for suicidal ideation   Diabetes mellitus 2002   Diagnosed in 2002, started insulin  in 2012   Gout    Headache(784.0)    CT head 08/2011: Periventricular and subcortical white matter hypodensities are most in keeping with chronic microangiopathic change   HIV infection ([name]) 08/2011   Followed by Dr. Eben   Hyperlipidemia    Hypertension    Pulmonary embolism [facility])       Dispostion: Disposition decision including need for hospitalization was considered, and patient discharged from emergency department.    Final Clinical Impression(s) / ED Diagnoses Final diagnoses:  Left leg pain     This chart was dictated using voice recognition software.  Despite best efforts to proofread,  errors can occur which can change the documentation meaning.    Francesca Elsie CROME, MD 06/18/24 [phone]

## 2024-07-03 NOTE — ED Provider Notes (Addendum)
 Chief Complaint  Patient presents with  . Urinary Complaint  . Weakness - Generalized       HPI 73 year old male history of HIV, A-fib, CKD on dialysis Monday Wednesday Friday, diabetes, CAD presenting today with a chief complaint of abdominal pain and burning with urination.  Patient does still intermittently make urine despite being on dialysis.  He also notes episodes of urinary incontinence.  Patient endorses the symptoms for 3 days.  Further the patient states he had a fall at the end of July, approximately 2 months ago and he is having blurry vision in both eyes worse on the left than the right.  Patient notes blood in his urine.  Patient endorses body aches.  Patient denies any fever, cough, chest pain, shortness of breath.  Patient denies any swelling in his legs.    Patient History Medical History[1] Surgical History[2] Family History[3] Social History[4]    Review of Systems Review of Systems    Physical Exam ED Triage Vitals [07/03/24 1618]  Temp 97.9 F (36.6 C)  Heart Rate 68  Resp 15  BP (!) 161/86  MAP (mmHg) 107  SpO2 100 %  O2 Device None (Room air)  O2 Flow Rate (L/min)   Weight    Physical Exam Vitals and nursing note reviewed.  Constitutional:      Appearance: Normal appearance.  HENT:     Head: Normocephalic and atraumatic.     Right Ear: External ear normal.     Left Ear: External ear normal.     Nose: Nose normal.     Mouth/Throat:     Mouth: Mucous membranes are moist.     Pharynx: Oropharynx is clear.  Eyes:     Conjunctiva/sclera: Conjunctivae normal.  Cardiovascular:     Pulses: Normal pulses.  Pulmonary:     Effort: Pulmonary effort is normal.     Breath sounds: Normal breath sounds.  Abdominal:     General: Abdomen is flat.     Palpations: Abdomen is soft.  Musculoskeletal:     Cervical back: Neck supple.  Skin:    General: Skin is warm and dry.     Capillary Refill: Capillary refill takes less than 2 seconds.      Comments: Fistula left upper extremity  Neurological:     General: No focal deficit present.     Mental Status: He is alert.  Psychiatric:        Mood and Affect: Mood normal.        Behavior: Behavior normal.        CHA2DS2-VASc Score: 5  Glasgow Coma Scale Score: 15                  Procedures                       ED Course & MDM   Medical Decision Making Problems Addressed: Generalized abdominal pain: complicated acute illness or injury Microscopic hematuria: complicated acute illness or injury  Amount and/or Complexity of Data Reviewed Labs: ordered. Radiology: ordered. ECG/medicine tests: ordered.  Risk Prescription drug management.   73 year old male history of HIV, A-fib, CKD on dialysis Monday Wednesday Friday, diabetes, CAD presenting today with a chief complaint of abdominal pain and burning with urination.  Blood work and imaging are ordered for further evaluation.    I did perform a bedside ocular ultrasound of the left eye to evaluate for acute  pathology.  There does not appear to be any lens dislocation.  No retinal detachment.  No vitreous hemorrhage.  Optic nerve is within normal limits.  Blood work returned overall unremarkable.  Patient's creatinine was 6.38 which is the patient's baseline given his end-stage renal disease.  Patient's hemoglobin was 12.8 which is similar to patient's prior.  No leukocytosis.  Patient actually is a low white blood cell count which is similar to his prior within our system.  At the end of my shift we are still awaiting results of CT imaging and urinalysis.  Patient is signed out to the oncoming physician, Dr. Dara for further care and evaluation.    ED Disposition:  Data Unavailable Final diagnoses:  None    ED Prescriptions   None           [1] Past Medical History: Diagnosis Date  . A-fib    (CMD)   . Arrhythmia   . Arthritis   . Asthma (CMD)   . CHF (congestive heart failure)    (CMD) 04/01/2017   . COPD (chronic obstructive pulmonary disease)    (CMD)   . Coronary artery disease, occlusive 10/15/2013  . Depression with suicidal ideation   . Diabetes mellitus    (CMD)   . Dilated cardiomyopathy    (CMD)   . Gout 2002  . HIV (human immunodeficiency virus infection)    (CMD)   . Hyperlipidemia   . Hypertension   . S/P colonoscopy 06/03/2013  . STEMI (ST elevation myocardial infarction)    (CMD) 2014   BMS to LAD, with DES placed 1 year later due to instent thrombosis  . Transfusion history   [2] Past Surgical History: Procedure Laterality Date  . ANTERIOR CERVICAL DISCECTOMY W/ FUSION N/A 11/15/2015   Procedure: ANTERIOR CERVICAL DISCECTOMY & FUSION MULTI LEVEL C3-7;  Surgeon: Toribio Dallas Snuffer, MD;  Location: [facility] MAIN OR;  Service: Neurosurgery;  Laterality: N/A;  Depuy, Fluoro and Microscope   . AV FISTULA PLACEMENT Left 09/11/2023   INSERTION AV GRAFT performed by Charolet DELENA Ishikawa, MD at [facility] OR  . BACK SURGERY     Procedure: BACK SURGERY  . CORONARY ARTERY BYPASS GRAFT N/A 01/29/2018   Procedure: CABG ON PUMP - CORONARY ARTERY BYPASS GRAFT x 3, MAZE WITH LEFT ATRIAL APPENDAGE EXCLUSION ([employer] notified);  Surgeon: Yancy Patriciaann Herder, MD;  Location: [facility] MAIN OR;  Service: Cardiothoracic;  Laterality: N/A;  . HERNIA REPAIR     Procedure: HERNIA REPAIR  . NECK SURGERY Left 1985   Procedure: NECK SURGERY  . SAPHENOUS VEIN GRAFT RESECTION Left 01/29/2018   Procedure: SAPHENOUS VEIN HARVEST;  Surgeon: Yancy Patriciaann Herder, MD;  Location: [facility] MAIN OR;  Service: Cardiothoracic;  Laterality: Left;  [3] Family History Problem Relation Name Age of Onset  . Diabetes Mother Ulla   . Diabetes Father Lenwood   . Cancer Father Lenwood   . Heart disease Brother Carlie   [4] Social History Tobacco Use  . Smoking status: Never  . Smokeless tobacco: Never  Substance Use Topics  . Alcohol use: No    Alcohol/week: 0.0 standard drinks of alcohol  . Drug use: Not Currently     Frequency: 5.0 times per week    Types: Cocaine, Crack cocaine    Comment: Drug use: Drug Use: Yes; last used 06/08/22

## 2024-07-12 ENCOUNTER — Ambulatory Visit (HOSPITAL_COMMUNITY): Admission: RE | Admit: 2024-07-12 | Source: Home / Self Care | Admitting: Vascular Surgery

## 2024-07-12 ENCOUNTER — Encounter (HOSPITAL_COMMUNITY): Admission: RE | Payer: Self-pay | Source: Home / Self Care

## 2024-07-12 SURGERY — A/V FISTULAGRAM
Anesthesia: LOCAL | Site: Arm Upper | Laterality: Left

## 2024-07-16 ENCOUNTER — Emergency Department (HOSPITAL_COMMUNITY)

## 2024-07-16 ENCOUNTER — Emergency Department (HOSPITAL_COMMUNITY)
Admission: EM | Admit: 2024-07-16 | Discharge: 2024-07-17 | Disposition: A | Discharge status: Skilled Nursing Facility | Attending: Emergency Medicine | Admitting: Emergency Medicine

## 2024-07-16 ENCOUNTER — Other Ambulatory Visit: Payer: Self-pay

## 2024-07-16 ENCOUNTER — Encounter (HOSPITAL_COMMUNITY): Payer: Self-pay | Admitting: Emergency Medicine

## 2024-07-16 DIAGNOSIS — I129 Hypertensive chronic kidney disease with stage 1 through stage 4 chronic kidney disease, or unspecified chronic kidney disease: Secondary | ICD-10-CM | POA: Diagnosis not present

## 2024-07-16 DIAGNOSIS — I2510 Atherosclerotic heart disease of native coronary artery without angina pectoris: Secondary | ICD-10-CM | POA: Insufficient documentation

## 2024-07-16 DIAGNOSIS — J45909 Unspecified asthma, uncomplicated: Secondary | ICD-10-CM | POA: Insufficient documentation

## 2024-07-16 DIAGNOSIS — R300 Dysuria: Secondary | ICD-10-CM | POA: Diagnosis present

## 2024-07-16 DIAGNOSIS — Z21 Asymptomatic human immunodeficiency virus [HIV] infection status: Secondary | ICD-10-CM | POA: Diagnosis not present

## 2024-07-16 DIAGNOSIS — N3090 Cystitis, unspecified without hematuria: Secondary | ICD-10-CM | POA: Diagnosis not present

## 2024-07-16 DIAGNOSIS — E1122 Type 2 diabetes mellitus with diabetic chronic kidney disease: Secondary | ICD-10-CM | POA: Diagnosis not present

## 2024-07-16 DIAGNOSIS — R1030 Lower abdominal pain, unspecified: Secondary | ICD-10-CM

## 2024-07-16 DIAGNOSIS — N184 Chronic kidney disease, stage 4 (severe): Secondary | ICD-10-CM | POA: Insufficient documentation

## 2024-07-16 DIAGNOSIS — N5089 Other specified disorders of the male genital organs: Secondary | ICD-10-CM | POA: Insufficient documentation

## 2024-07-16 MED ORDER — HYDROCODONE-ACETAMINOPHEN 5-325 MG PO TABS
1.0000 | ORAL_TABLET | Freq: Once | ORAL | Status: AC
  Administered 2024-07-16: 1 via ORAL
  Filled 2024-07-16: qty 1

## 2024-07-16 NOTE — ED Notes (Signed)
 Pt unable to provide urine and refused labs

## 2024-07-16 NOTE — ED Notes (Signed)
 Pt refused labs, wants Ultrasound IV only

## 2024-07-16 NOTE — ED Notes (Signed)
 Unable to obtain labs. Pt is refusing any more sticks at the moment. RN Jenna aware.

## 2024-07-16 NOTE — ED Triage Notes (Signed)
 BIB EMS from [facility]. Complains of penile, testicle pain x 2 days with discharge. Also has left lower abd pain. PT is dialysis pt m/w/f  EMS  99%  71 HR 146/80

## 2024-07-16 NOTE — ED Provider Triage Note (Addendum)
 Emergency Medicine Provider Triage Evaluation Note  Antonius Hartlage , a 73 y.o. male  was evaluated in triage.  Pt complains of testicle pain, abdominal pain. Reports symptoms x 2 days. States he only has 1 testicle. Pain radiates into his abdomen and is severe. Also reports dysuria and penile discharge. He does still make urine despite being on dialysis. Denies any missed dialysis sessions. Denies recent sexual activity  Review of Systems  Positive:  Negative:   Physical Exam  BP 130/66   Pulse 69   Temp 98.1 F (36.7 C)   Resp 16   Ht 5' 8 (1.727 m)   Wt 67 kg   SpO2 100%   BMI 22.46 kg/m  Gen:   Awake, no distress   Resp:  Normal effort  MSK:   Moves extremities without difficulty  Other:    Medical Decision Making  Medically screening exam initiated at 5:39 PM.  Appropriate orders placed.  Lynwood Nickie Piety was informed that the remainder of the evaluation will be completed by another provider, this initial triage assessment does not replace that evaluation, and the importance of remaining in the ED until their evaluation is complete.     Nora Lauraine LABOR, PA-C 07/16/24 1741    Nora Lauraine LABOR, PA-C 07/16/24 2012

## 2024-07-17 DIAGNOSIS — N3090 Cystitis, unspecified without hematuria: Secondary | ICD-10-CM | POA: Diagnosis not present

## 2024-07-17 LAB — CBC
HCT: 36.1 % — ABNORMAL LOW (ref 39.0–52.0)
Hemoglobin: 11.2 g/dL — ABNORMAL LOW (ref 13.0–17.0)
MCH: 32.7 pg (ref 26.0–34.0)
MCHC: 31 g/dL (ref 30.0–36.0)
MCV: 105.2 fL — ABNORMAL HIGH (ref 80.0–100.0)
Platelets: 208 K/uL (ref 150–400)
RBC: 3.43 MIL/uL — ABNORMAL LOW (ref 4.22–5.81)
RDW: 15.8 % — ABNORMAL HIGH (ref 11.5–15.5)
WBC: 7.1 K/uL (ref 4.0–10.5)
nRBC: 0 % (ref 0.0–0.2)

## 2024-07-17 LAB — COMPREHENSIVE METABOLIC PANEL WITH GFR
ALT: 10 U/L (ref 0–44)
AST: 19 U/L (ref 15–41)
Albumin: 3.4 g/dL — ABNORMAL LOW (ref 3.5–5.0)
Alkaline Phosphatase: 62 U/L (ref 38–126)
Anion gap: 15 (ref 5–15)
BUN: 27 mg/dL — ABNORMAL HIGH (ref 8–23)
CO2: 28 mmol/L (ref 22–32)
Calcium: 8.4 mg/dL — ABNORMAL LOW (ref 8.9–10.3)
Chloride: 93 mmol/L — ABNORMAL LOW (ref 98–111)
Creatinine, Ser: 6.71 mg/dL — ABNORMAL HIGH (ref 0.61–1.24)
GFR, Estimated: 8 mL/min — ABNORMAL LOW (ref >60)
Glucose, Bld: 185 mg/dL — ABNORMAL HIGH (ref 70–99)
Potassium: 4 mmol/L (ref 3.5–5.1)
Sodium: 136 mmol/L (ref 135–145)
Total Bilirubin: 0.4 mg/dL (ref 0.0–1.2)
Total Protein: 6.9 g/dL (ref 6.5–8.1)

## 2024-07-17 LAB — LIPASE, BLOOD: Lipase: 31 U/L (ref 11–51)

## 2024-07-17 MED ORDER — CEPHALEXIN 250 MG PO CAPS
500.0000 mg | ORAL_CAPSULE | Freq: Once | ORAL | Status: AC
  Administered 2024-07-17: 500 mg via ORAL
  Filled 2024-07-17: qty 2

## 2024-07-17 MED ORDER — CEPHALEXIN 500 MG PO CAPS: 500.0000 mg | ORAL_CAPSULE | Freq: Two times a day (BID) | ORAL | 0 refills | Status: DC

## 2024-07-17 NOTE — ED Provider Notes (Signed)
 Emergency Department Provider Note   I have reviewed the triage vital signs and the nursing notes.   HISTORY  Chief Complaint Abdominal Pain and Penile Discharge   HPI Michael [name] is a 73 y.o. male with past history reviewed below presents to the emergency department with abdominal and some testicle discomfort.  Describes some occasional nausea.  Symptoms been ongoing for the past 2 days.  He reports some mild dysuria with some occasional discharge.  He is not currently sexually active.  He is on dialysis but denies any missed sessions. No vomiting or diarrhea. He does make urine.    Past Medical History:  Diagnosis Date   A-fib ([name])    Anemia    Asthma    No PFTs, history of childhood asthma   CAD (coronary artery disease)    Cellulitis 04/2014   left facial   Cellulitis and abscess of toe of right foot 12/08/2019   Chondromalacia of medial femoral condyle    Left knee MRI 04/28/12: Chondromalacia of the medial femoral condyle with slight peripheral degeneration of the meniscocapsular junction of the medial meniscus; followed by sports medicine   Chronic kidney failure, stage 4 (severe) ([name])    Collagen vascular disease    Crack cocaine use    for 20+ years, has been enrolled in detox programs in the past   Depression    with history of hospitalization for suicidal ideation   Diabetes mellitus 2002   Diagnosed in 2002, started insulin  in 2012   Gout    Headache(784.0)    CT head 08/2011: Periventricular and subcortical white matter hypodensities are most in keeping with chronic microangiopathic change   HIV infection ([name]) 08/2011   Followed by Dr. Eben   Hyperlipidemia    Hypertension    Pulmonary embolism ([name])     Review of Systems  Constitutional: No fever/chills Cardiovascular: Denies chest pain. Respiratory: Denies shortness of breath. Gastrointestinal: Positive abdominal pain. Positive nausea, no vomiting.  No diarrhea.  No  constipation. Genitourinary: Positive dysuria.  Musculoskeletal: Negative for back pain. Skin: Negative for rash. Neurological: Negative for headaches.  ____________________________________________   PHYSICAL EXAM:  VITAL SIGNS: ED Triage Vitals  Encounter Vitals Group     BP 07/16/24 1526 130/66     Pulse Rate 07/16/24 1526 69     Resp 07/16/24 1526 16     Temp 07/16/24 1526 98.1 F (36.7 C)     Temp Source 07/16/24 2337 Oral     SpO2 07/16/24 1526 100 %     Weight 07/16/24 1550 147 lb 11.3 oz (67 kg)     Height 07/16/24 1550 5' 8 (1.727 m)   Constitutional: Alert and oriented. Well appearing and in no acute distress. Eyes: Conjunctivae are normal.  Head: Atraumatic. Nose: No congestion/rhinnorhea. Mouth/Throat: Mucous membranes are moist.   Neck: No stridor.   Cardiovascular: Normal rate, regular rhythm. Good peripheral circulation. Grossly normal heart sounds.   Respiratory: Normal respiratory effort.  No retractions. Lungs CTAB. Gastrointestinal: Soft and nontender. No distention.  Musculoskeletal: No lower extremity tenderness nor edema. No gross deformities of extremities. Neurologic:  Normal speech and language. Skin:  Skin is warm, dry and intact. No rash noted.  ____________________________________________   LABS (all labs ordered are listed, but only abnormal results are displayed)  Labs Reviewed  LIPASE, BLOOD  COMPREHENSIVE METABOLIC PANEL WITH GFR  CBC  URINALYSIS, ROUTINE W REFLEX MICROSCOPIC  I-STAT CG4 LACTIC ACID, ED  I-STAT CG4 LACTIC ACID, ED  ____________________________________________  RADIOLOGY  CT ABDOMEN PELVIS WO CONTRAST Result Date: 07/16/2024 CLINICAL DATA:  Acute non localized abdominal pain. Penile and testicular pain for 2 days with discharge. Left lower abdominal pain. EXAM: CT ABDOMEN AND PELVIS WITHOUT CONTRAST TECHNIQUE: Multidetector CT imaging of the abdomen and pelvis was performed following the standard protocol without  IV contrast. RADIATION DOSE REDUCTION: This exam was performed according to the departmental dose-optimization program which includes automated exposure control, adjustment of the mA and/or kV according to patient size and/or use of iterative reconstruction technique. COMPARISON:  Ultrasound scrotum 07/16/2024. CT abdomen pelvis 05/05/2024 FINDINGS: Lower chest: Linear scarring in the lung bases. Postoperative changes in the mediastinum. Hepatobiliary: No focal liver abnormality is seen. No gallstones, gallbladder wall thickening, or biliary dilatation. Pancreas: Unremarkable. No pancreatic ductal dilatation or surrounding inflammatory changes. Spleen: Normal in size without focal abnormality. Adrenals/Urinary Tract: Adrenal glands are unremarkable. Kidneys are normal, without renal calculi, focal lesion, or hydronephrosis. Bladder wall is diffusely thickened. This may be due to underdistention but could indicate cystitis. Correlate with urinalysis. Stomach/Bowel: Stomach, small bowel, and colon are not abnormally distended. No wall thickening or inflammatory stranding. Contrast material is demonstrated throughout the colon suggesting no evidence of obstruction. Stool also throughout the colon. Appendix is normal. Vascular/Lymphatic: Aortic atherosclerosis. No enlarged abdominal or pelvic lymph nodes. Reproductive: Prostate gland is nonenlarged. Left scrotal calcifications are demonstrated, possibly dystrophic or postinflammatory. No significant perineal or penile soft tissue swelling, gas, or collection. Other: No free air or free fluid in the abdomen. Minimal periumbilical hernia containing fat. No inguinal hernias. Musculoskeletal: No acute or significant osseous findings. Degenerative changes throughout the lumbar spine. Postoperative changes of the posterior elements at L5-S1. Degenerative changes in the hips. IMPRESSION: 1. No scrotal or penile abscess or infiltration demonstrated. 2. Bladder wall thickening  may be due to underdistention or cystitis. Correlate with urinalysis. 3. No evidence of bowel obstruction or inflammation. 4. Aortic atherosclerosis. Electronically Signed   By: Elsie Gravely M.D.   On: 07/16/2024 20:37   US  SCROTUM W/DOPPLER Result Date: 07/16/2024 CLINICAL DATA:  Testicular pain on the left, history of prior right orchiectomy EXAM: SCROTAL ULTRASOUND DOPPLER ULTRASOUND OF THE TESTICLES TECHNIQUE: Complete ultrasound examination of the testicles, epididymis, and other scrotal structures was performed. Color and spectral Doppler ultrasound were also utilized to evaluate blood flow to the testicles. COMPARISON:  02/25/2018 FINDINGS: Right testicle Surgically absent Left testicle Measurements: 3.2 x 1.4 x 2.2 cm. No mass or microlithiasis visualized. Right epididymis:  Surgically removed Left epididymis: Multiple calcifications are noted. Some mobile echogenic foci are noted. This is of uncertain significance but may represent air within vascular structures. Hydrocele:  None visualized. Varicocele: Prominent pampiniform plexus is noted on the left although no significant increase with Valsalva maneuvering is noted to suggest varicocele. Pulsed Doppler interrogation of the left testicle shows normal low resistance arterial and venous waveforms . IMPRESSION: Status post right orchiectomy Normal-appearing left testicle. Left epididymal calcifications are noted as well as some mobile echogenic foci suggestive of air. This may be related to an underlying infection. CT of the pelvis without contrast may be helpful to evaluate for foci of air in the region of the epididymis. Prominent pampiniform plexus on the left although no true varicocele is seen. Electronically Signed   By: Oneil Devonshire M.D.   On: 07/16/2024 19:23    ____________________________________________   PROCEDURES  Procedure(s) performed:   Procedures  None  ____________________________________________   INITIAL  IMPRESSION / ASSESSMENT AND  PLAN / ED COURSE  Pertinent labs & imaging results that were available during my care of the patient were reviewed by me and considered in my medical decision making (see chart for details).   This patient is Presenting for Evaluation of abdominal pain, which does require a range of treatment options, and is a complaint that involves a high risk of morbidity and mortality.  The Differential Diagnoses includes but is not exclusive to acute appendicitis, renal colic, testicular torsion, urinary tract infection, prostatitis,  diverticulitis, small bowel obstruction, colitis, abdominal aortic aneurysm, gastroenteritis, constipation etc.   Critical Interventions-    Medications  HYDROcodone -acetaminophen  (NORCO/VICODIN) 5-325 MG per tablet 1 tablet (1 tablet Oral Given 07/16/24 1810)    Reassessment after intervention: pain improved.   Clinical Laboratory Tests Ordered, included ***  Radiologic Tests Ordered, included CT abdomen/pelvis and scrotal US . I independently interpreted the images and agree with radiology interpretation.   Medical Decision Making: Summary:  The patient presents the emergency department with abdominal pain with some dysuria.  He does have ESRD but continues to make urine.  He is not sexually active.  CT shows findings concerning for possible cystitis.  Plan to correlate this with UA.  Given ESRD plan to obtain screening blood work including electrolytes.  No SIRS vitals or concern for sepsis. No torsion on US .   Reevaluation with update and discussion with   ***Considered admission***  Patient's presentation is most consistent with acute presentation with potential threat to life or bodily function.   Disposition:   ____________________________________________  FINAL CLINICAL IMPRESSION(S) / ED DIAGNOSES  Final diagnoses:  None     NEW OUTPATIENT MEDICATIONS STARTED DURING THIS VISIT:  New Prescriptions   No medications on  file    Note:  This document was prepared using Dragon voice recognition software and may include unintentional dictation errors.  Fonda Law, MD, [facility] Emergency Medicine

## 2024-07-17 NOTE — ED Notes (Signed)
 PTAR called

## 2024-07-17 NOTE — ED Notes (Signed)
 Phleb unable to get labs

## 2024-07-17 NOTE — Discharge Instructions (Signed)
 You have been seen in the Emergency Department (ED) today for pain in your lower abdomen. Your CT scan shows some inflammation in your bladder and I am treating you with an antibiotic. Take as directed.   Please take your antibiotic as prescribed and over-the-counter pain medication (Tylenol ) as needed, but no more than recommended on the label instructions.  Drink PLENTY of fluids.  Call your regular doctor to schedule the next available appointment to follow up on today's ED visit, or return immediately to the ED if your pain worsens, you have decreased urine production, develop fever, persistent vomiting, or other symptoms that concern you.

## 2024-07-18 ENCOUNTER — Emergency Department (HOSPITAL_COMMUNITY)

## 2024-07-18 ENCOUNTER — Other Ambulatory Visit: Payer: Self-pay

## 2024-07-18 ENCOUNTER — Encounter (HOSPITAL_COMMUNITY): Payer: Self-pay

## 2024-07-18 ENCOUNTER — Emergency Department (HOSPITAL_COMMUNITY)
Admission: EM | Admit: 2024-07-18 | Discharge: 2024-07-18 | Disposition: A | Discharge status: Skilled Nursing Facility | Attending: Emergency Medicine | Admitting: Emergency Medicine

## 2024-07-18 DIAGNOSIS — Z992 Dependence on renal dialysis: Secondary | ICD-10-CM | POA: Diagnosis not present

## 2024-07-18 DIAGNOSIS — I4891 Unspecified atrial fibrillation: Secondary | ICD-10-CM | POA: Insufficient documentation

## 2024-07-18 DIAGNOSIS — N12 Tubulo-interstitial nephritis, not specified as acute or chronic: Secondary | ICD-10-CM | POA: Diagnosis not present

## 2024-07-18 DIAGNOSIS — E1122 Type 2 diabetes mellitus with diabetic chronic kidney disease: Secondary | ICD-10-CM | POA: Insufficient documentation

## 2024-07-18 DIAGNOSIS — I2510 Atherosclerotic heart disease of native coronary artery without angina pectoris: Secondary | ICD-10-CM | POA: Insufficient documentation

## 2024-07-18 DIAGNOSIS — I120 Hypertensive chronic kidney disease with stage 5 chronic kidney disease or end stage renal disease: Secondary | ICD-10-CM | POA: Insufficient documentation

## 2024-07-18 DIAGNOSIS — Z951 Presence of aortocoronary bypass graft: Secondary | ICD-10-CM | POA: Diagnosis not present

## 2024-07-18 DIAGNOSIS — Z21 Asymptomatic human immunodeficiency virus [HIV] infection status: Secondary | ICD-10-CM | POA: Diagnosis not present

## 2024-07-18 DIAGNOSIS — N186 End stage renal disease: Secondary | ICD-10-CM | POA: Diagnosis not present

## 2024-07-18 DIAGNOSIS — R1024 Suprapubic pain: Secondary | ICD-10-CM

## 2024-07-18 DIAGNOSIS — Z79899 Other long term (current) drug therapy: Secondary | ICD-10-CM | POA: Insufficient documentation

## 2024-07-18 DIAGNOSIS — J45909 Unspecified asthma, uncomplicated: Secondary | ICD-10-CM | POA: Insufficient documentation

## 2024-07-18 DIAGNOSIS — R509 Fever, unspecified: Secondary | ICD-10-CM | POA: Insufficient documentation

## 2024-07-18 DIAGNOSIS — R1030 Lower abdominal pain, unspecified: Secondary | ICD-10-CM | POA: Diagnosis present

## 2024-07-18 LAB — URINALYSIS, W/ REFLEX TO CULTURE (INFECTION SUSPECTED)
Bacteria, UA: NONE SEEN
Bilirubin Urine: NEGATIVE
Glucose, UA: NEGATIVE mg/dL
Ketones, ur: NEGATIVE mg/dL
Nitrite: NEGATIVE
Protein, ur: 100 mg/dL — AB
Specific Gravity, Urine: 1.012 (ref 1.005–1.030)
WBC, UA: >50 WBC/hpf (ref 0–5)
pH: 8 (ref 5.0–8.0)

## 2024-07-18 LAB — RESP PANEL BY RT-PCR (RSV, FLU A&B, COVID)  RVPGX2
Influenza A by PCR: NEGATIVE
Influenza B by PCR: NEGATIVE
Resp Syncytial Virus by PCR: NEGATIVE
SARS Coronavirus 2 by RT PCR: NEGATIVE

## 2024-07-18 LAB — CBC WITH DIFFERENTIAL/PLATELET
Abs Immature Granulocytes: 0.05 K/uL (ref 0.00–0.07)
Basophils Absolute: 0 K/uL (ref 0.0–0.1)
Basophils Relative: 0 %
Eosinophils Absolute: 0 K/uL (ref 0.0–0.5)
Eosinophils Relative: 0 %
HCT: 37.7 % — ABNORMAL LOW (ref 39.0–52.0)
Hemoglobin: 11.8 g/dL — ABNORMAL LOW (ref 13.0–17.0)
Immature Granulocytes: 1 %
Lymphocytes Relative: 13 %
Lymphs Abs: 1 K/uL (ref 0.7–4.0)
MCH: 32.2 pg (ref 26.0–34.0)
MCHC: 31.3 g/dL (ref 30.0–36.0)
MCV: 103 fL — ABNORMAL HIGH (ref 80.0–100.0)
Monocytes Absolute: 0.6 K/uL (ref 0.1–1.0)
Monocytes Relative: 7 %
Neutro Abs: 6.6 K/uL (ref 1.7–7.7)
Neutrophils Relative %: 79 %
Platelets: 189 K/uL (ref 150–400)
RBC: 3.66 MIL/uL — ABNORMAL LOW (ref 4.22–5.81)
RDW: 15.3 % (ref 11.5–15.5)
WBC: 8.3 K/uL (ref 4.0–10.5)
nRBC: 0 % (ref 0.0–0.2)

## 2024-07-18 LAB — COMPREHENSIVE METABOLIC PANEL WITH GFR
ALT: 11 U/L (ref 0–44)
AST: 18 U/L (ref 15–41)
Albumin: 3.4 g/dL — ABNORMAL LOW (ref 3.5–5.0)
Alkaline Phosphatase: 73 U/L (ref 38–126)
Anion gap: 11 (ref 5–15)
BUN: 44 mg/dL — ABNORMAL HIGH (ref 8–23)
CO2: 29 mmol/L (ref 22–32)
Calcium: 8.9 mg/dL (ref 8.9–10.3)
Chloride: 94 mmol/L — ABNORMAL LOW (ref 98–111)
Creatinine, Ser: 8.93 mg/dL — ABNORMAL HIGH (ref 0.61–1.24)
GFR, Estimated: 6 mL/min — ABNORMAL LOW (ref >60)
Glucose, Bld: 140 mg/dL — ABNORMAL HIGH (ref 70–99)
Potassium: 5.4 mmol/L — ABNORMAL HIGH (ref 3.5–5.1)
Sodium: 134 mmol/L — ABNORMAL LOW (ref 135–145)
Total Bilirubin: 0.6 mg/dL (ref 0.0–1.2)
Total Protein: 7.5 g/dL (ref 6.5–8.1)

## 2024-07-18 LAB — I-STAT CHEM 8, ED
BUN: 43 mg/dL — ABNORMAL HIGH (ref 8–23)
Calcium, Ion: 1.05 mmol/L — ABNORMAL LOW (ref 1.15–1.40)
Chloride: 96 mmol/L — ABNORMAL LOW (ref 98–111)
Creatinine, Ser: 9.5 mg/dL — ABNORMAL HIGH (ref 0.61–1.24)
Glucose, Bld: 135 mg/dL — ABNORMAL HIGH (ref 70–99)
HCT: 38 % — ABNORMAL LOW (ref 39.0–52.0)
Hemoglobin: 12.9 g/dL — ABNORMAL LOW (ref 13.0–17.0)
Potassium: 5.4 mmol/L — ABNORMAL HIGH (ref 3.5–5.1)
Sodium: 135 mmol/L (ref 135–145)
TCO2: 31 mmol/L (ref 22–32)

## 2024-07-18 LAB — I-STAT CG4 LACTIC ACID, ED: Lactic Acid, Venous: 1 mmol/L (ref 0.5–1.9)

## 2024-07-18 MED ORDER — SODIUM ZIRCONIUM CYCLOSILICATE 10 G PO PACK
10.0000 g | PACK | ORAL | Status: AC
  Administered 2024-07-18: 10 g via ORAL
  Filled 2024-07-18: qty 1

## 2024-07-18 MED ORDER — SULFAMETHOXAZOLE-TRIMETHOPRIM 800-160 MG PO TABS
1.0000 | ORAL_TABLET | Freq: Two times a day (BID) | ORAL | 0 refills | Status: DC
  Filled 2024-07-18: qty 28, 14d supply, fill #0

## 2024-07-18 MED ORDER — SODIUM CHLORIDE 0.9 % IV SOLN
1.0000 g | Freq: Once | INTRAVENOUS | Status: AC
  Administered 2024-07-18: 1 g via INTRAVENOUS
  Filled 2024-07-18: qty 10

## 2024-07-18 MED ORDER — ACETAMINOPHEN 500 MG PO TABS
1000.0000 mg | ORAL_TABLET | ORAL | Status: AC
  Administered 2024-07-18: 1000 mg via ORAL
  Filled 2024-07-18: qty 2

## 2024-07-18 MED ORDER — IOHEXOL 350 MG/ML SOLN
75.0000 mL | Freq: Once | INTRAVENOUS | Status: AC | PRN
  Administered 2024-07-18: 75 mL via INTRAVENOUS

## 2024-07-18 MED ORDER — CEFPODOXIME PROXETIL 200 MG PO TABS
100.0000 mg | ORAL_TABLET | Freq: Every day | ORAL | 0 refills | Status: AC
  Filled 2024-07-18: qty 7, 14d supply, fill #0

## 2024-07-18 NOTE — Discharge Instructions (Addendum)
 Your urinalysis today was concerning for infection.  We will go ahead and treat you with antibiotics, please take 1 tablet daily AT NIGHT.  If you experience any worsening pain, please return to the ED.

## 2024-07-18 NOTE — ED Notes (Signed)
 Pt states he does not make a lot of urine but he is able to let us  know when he needs to go. Pt provided with urinal.

## 2024-07-18 NOTE — ED Provider Notes (Signed)
  Physical Exam  BP 129/75   Pulse 67   Temp 98.4 F (36.9 C) (Oral)   Resp 16   Ht 5' 8 (1.727 m)   Wt 66.7 kg   SpO2 100%   BMI 22.35 kg/m   Physical Exam Vitals and nursing note reviewed.  Constitutional:      Appearance: He is well-developed.  HENT:     Head: Normocephalic and atraumatic.  Cardiovascular:     Rate and Rhythm: Normal rate.  Pulmonary:     Effort: Pulmonary effort is normal.     Breath sounds: No wheezing.  Abdominal:     General: Abdomen is flat.     Palpations: Abdomen is soft.     Tenderness: There is right CVA tenderness and left CVA tenderness.  Skin:    General: Skin is warm and dry.  Neurological:     Mental Status: He is alert and oriented to person, place, and time.     Procedures  Procedures  ED Course / MDM   Clinical Course as of 07/18/24 1805  Sun Jul 18, 2024  1435 MWF - full session friday [WF]  [address]): SMALL [JS]  1749 WBC, UA: >50 [JS]    Clinical Course User Index [JS] Adelaide Pfefferkorn, PA-C [WF] Neldon Hamp RAMAN, [place]   Medical Decision Making Amount and/or Complexity of Data Reviewed Labs: ordered. Decision-making details documented in ED Course. Radiology: ordered.  Risk OTC drugs. Prescription drug management.   Patient here to see him from [place] F. PA at shift change, please see his note for a full HPI. Briefly, patient here from [facility] with abdominal pain, previously seen here 2 days ago treated prophylactically for urinary tract infection.  He reports he was given a prescription for Keflex  which he never picked up.  He is here today with a fever.  Labs are reassuring without any signs of sepsis, however he continues to be febrile with a temperature 101.1. Plan is for following CTAP.  CT abdomen pelvis showed: 1. Circumferential mural thickening of the urinary bladder with  mucosal hyperenhancement, suggestive of cystitis. Recommend  correlation with urinalysis.  2. Moderate to large volume stool  throughout the colon, particularly  in the splenic flexure.  3. Bilateral mildly hyperattenuating exophytic renal lesions,  incompletely characterized. Recommend further evaluation with  nonemergent renal protocol MRI or CT abdomen.  4. 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 Recommend follow-up pancreatic protocol CT or MRI  in 2 years until 10 year stability is established.  5.  Aortic Atherosclerosis (ICD10-I70.0).   5:48 PM Patient evaluated by me, has CVA tenderness bilaterally R > L, will treat for pyelonephritis.  No signs of sepsis.  Fever is under control he is tolerating p.o.  He does do dialysis Monday, Wednesday, Friday, will treat him with 14 days of Cefpodoxime 100 mg after discussion with Pharmacist. Will need to take this medication at night. He is hemodynamically stable for discharge.    Portions of this note were generated with Scientist, clinical (histocompatibility and immunogenetics). Dictation errors may occur despite best attempts at proofreading.         Mariha Sleeper, PA-C 07/18/24 1805    Rogelia Jerilynn RAMAN, MD 07/22/24 [phone]

## 2024-07-18 NOTE — ED Notes (Signed)
 Ptar called

## 2024-07-18 NOTE — ED Triage Notes (Signed)
 Patient bib ems from [facility].  Was discharged a couple days ago for bladder infection and facility never got antbiotics.  Patient comes back with increased pain in lower abd.Now febrile.

## 2024-07-18 NOTE — ED Notes (Signed)
 IV team at bedside

## 2024-07-18 NOTE — ED Provider Notes (Signed)
 [facility] DE[facility]MENT AT [facility] Provider Note   CSN: [id number] Arrival date & time: 07/18/24  1411     Patient presents with: Abdominal Pain   Michael [name] is a 73 y.o. male.    Abdominal Pain  Patient is a 73 year old male with a past medical history significant for DM2, HTN, HIV, A-fib, asthma, CAD, pulmonary embolism, CKD on dialysis Monday Wednesday Friday had full dialysis session Friday, asthma, hyperlipidemia, history of CABG  Patient presents emergency room today with complaints of fever and abdominal pain.  Seems that he was seen 10/10 and prescribed Keflex  for presumed UTI however medication was not given to patient at SNF and today he developed a fever.  He states he has some abdominal discomfort lower abdomen.  Endorses some mild nausea but no vomiting.  No diarrhea no blood in his stool.  He does make urine but he states he does so intermittently he has noted some burning with urination.  He has noticed that he has been coughing some over the past 7 days.  Today was his first day of fever.       Prior to Admission medications   Medication Sig Start Date End Date Taking? Authorizing Provider  acetaminophen  (TYLENOL ) 325 MG tablet Take 650 mg by mouth every 6 (six) hours as needed (pain/fever).    [provider]  albuterol  (VENTOLIN  HFA) 108 (90 Base) MCG/ACT inhaler Inhale 1 puff into the lungs every 4 (four) hours as needed for wheezing or shortness of breath.    [provider]  atorvastatin  (LIPITOR) 40 MG tablet Take 40 mg by mouth at bedtime. (2100)    [provider]  bictegravir-emtricitabine -tenofovir  AF (BIKTARVY ) 50-200-25 MG TABS tablet Take 1 tablet by mouth daily. Try to take at the same time each day with or without food. 02/26/24   Dennise Kingsley, MD  cephALEXin  (KEFLEX ) 500 MG capsule Take 1 capsule (500 mg total) by mouth 2 (two) times daily for 7 days. 07/17/24 07/24/24  LongFonda MATSU, MD   clopidogrel  (PLAVIX ) 75 MG tablet Take 75 mg by mouth at bedtime. (2100)    [provider]  diltiazem  (CARDIZEM ) 120 MG tablet Take 120 mg by mouth at bedtime. (2100) 01/17/24   [provider]  famotidine  (PEPCID ) 20 MG tablet Take 20 mg by mouth at bedtime. (2100)    [provider]  FLUoxetine  (PROZAC ) 20 MG capsule Take 20 mg by mouth at bedtime. (2100)    [provider]  fluticasone  furoate-vilanterol (BREO ELLIPTA ) 100-25 MCG/ACT AEPB Inhale 1 puff into the lungs daily. (0900)    [provider]  gabapentin  (NEURONTIN ) 300 MG capsule Take 300 mg by mouth at bedtime. (2100)    [provider]  magnesium  hydroxide (MILK OF MAGNESIA) 400 MG/5ML suspension Take 30 mLs by mouth daily as needed (constipation (no bm in 3 days)).    [provider]  multivitamin (RENA-VIT) TABS tablet Take 1 tablet by mouth in the morning. (0900)    [provider]  nitroGLYCERIN  (NITROSTAT ) 0.4 MG SL tablet Place 1 tablet (0.4 mg total) under the tongue every 5 (five) minutes as needed for chest pain. 02/19/23   Lue Elsie BROCKS, MD  ondansetron  (ZOFRAN ) 4 MG tablet Take 4 mg by mouth every 4 (four) hours as needed for nausea or vomiting.    [provider]  senna-docusate (SENOKOT-S) 8.6-50 MG tablet Take 2 tablets by mouth at bedtime. (2100)    [provider]  sevelamer  carbonate (RENVELA ) 800 MG tablet Take 800 mg by mouth 3 (three) times daily with meals. (0800, 1100 & 1600)    [provider]  Tiotropium Bromide Monohydrate (SPIRIVA RESPIMAT) 2.5 MCG/ACT AERS Inhale 1 puff into the lungs in the morning. (0900)    [provider]  traZODone  (DESYREL ) 50 MG tablet Take 50 mg by mouth at bedtime. (2100)    [provider]  Vitamin D, Ergocalciferol, (DRISDOL) 1.25 MG (50000 UNIT) CAPS capsule Take 50,000 Units by mouth every Friday. (2100)    [provider]    Allergies: Patient has  no known allergies.    Review of Systems  Gastrointestinal:  Positive for abdominal pain.    Updated Vital Signs BP 118/66 (BP Location: Right Arm)   Pulse 68   Temp (!) 101.1 F (38.4 C) (Oral)   Resp 20   Ht 5' 8 (1.727 m)   Wt 66.7 kg   SpO2 100%   BMI 22.35 kg/m   Physical Exam Vitals and nursing note reviewed.  Constitutional:      General: He is not in acute distress.    Comments: Pleasant well-appearing 73 year old.  In no acute distress.  Sitting comfortably in bed.  Able answer questions appropriately follow commands. No increased work of breathing. Speaking in full sentences.   HENT:     Head: Normocephalic and atraumatic.     Nose: Nose normal.     Mouth/Throat:     Mouth: Mucous membranes are moist.  Eyes:     General: No scleral icterus. Cardiovascular:     Rate and Rhythm: Normal rate and regular rhythm.     Pulses: Normal pulses.     Heart sounds: Normal heart sounds.     Comments: Left upper extremity fistula with good thrill Pulmonary:     Effort: Pulmonary effort is normal. No respiratory distress.     Breath sounds: No wheezing.  Abdominal:     Palpations: Abdomen is soft.     Tenderness: There is abdominal tenderness. There is no guarding or rebound.     Comments: Lower abdominal tenderness.  No guarding or rebound  Musculoskeletal:     Cervical back: Normal range of motion.     Right lower leg: No edema.     Left lower leg: No edema.  Skin:    General: Skin is warm and dry.     Capillary Refill: Capillary refill takes less than 2 seconds.  Neurological:     Mental Status: He is alert. Mental status is at baseline.  Psychiatric:        Mood and Affect: Mood normal.        Behavior: Behavior normal.     (all labs ordered are listed, but only abnormal results are displayed) Labs Reviewed  CBC WITH DIFFERENTIAL/PLATELET - Abnormal; Notable for the following components:      Result Value   RBC 3.66 (*)    Hemoglobin 11.8 (*)    HCT 37.7  (*)    MCV 103.0 (*)    All other components within normal limits  I-STAT CHEM 8, ED - Abnormal; Notable for the following components:   Potassium 5.4 (*)    Chloride 96 (*)    BUN 43 (*)    Creatinine, Ser 9.50 (*)    Glucose, Bld 135 (*)    Calcium , Ion 1.05 (*)    Hemoglobin 12.9 (*)    HCT 38.0 (*)    All other components  within normal limits  RESP PANEL BY RT-PCR (RSV, FLU A&B, COVID)  [id number]  COMPREHENSIVE METABOLIC PANEL WITH GFR  URINALYSIS, W/ REFLEX TO CULTURE (INFECTION SUSPECTED)  I-STAT CG4 LACTIC ACID, ED    EKG: None  Radiology: CT ABDOMEN PELVIS WO CONTRAST Result Date: 07/16/2024 CLINICAL DATA:  Acute non localized abdominal pain. Penile and testicular pain for 2 days with discharge. Left lower abdominal pain. EXAM: CT ABDOMEN AND PELVIS WITHOUT CONTRAST TECHNIQUE: Multidetector CT imaging of the abdomen and pelvis was performed following the standard protocol without IV contrast. RADIATION DOSE REDUCTION: This exam was performed according to the departmental dose-optimization program which includes automated exposure control, adjustment of the mA and/or kV according to patient size and/or use of iterative reconstruction technique. COMPARISON:  Ultrasound scrotum 07/16/2024. CT abdomen pelvis 05/05/2024 FINDINGS: Lower chest: Linear scarring in the lung bases. Postoperative changes in the mediastinum. Hepatobiliary: No focal liver abnormality is seen. No gallstones, gallbladder wall thickening, or biliary dilatation. Pancreas: Unremarkable. No pancreatic ductal dilatation or surrounding inflammatory changes. Spleen: Normal in size without focal abnormality. Adrenals/Urinary Tract: Adrenal glands are unremarkable. Kidneys are normal, without renal calculi, focal lesion, or hydronephrosis. Bladder wall is diffusely thickened. This may be due to underdistention but could indicate cystitis. Correlate with urinalysis. Stomach/Bowel: Stomach, small bowel, and colon are not  abnormally distended. No wall thickening or inflammatory stranding. Contrast material is demonstrated throughout the colon suggesting no evidence of obstruction. Stool also throughout the colon. Appendix is normal. Vascular/Lymphatic: Aortic atherosclerosis. No enlarged abdominal or pelvic lymph nodes. Reproductive: Prostate gland is nonenlarged. Left scrotal calcifications are demonstrated, possibly dystrophic or postinflammatory. No significant perineal or penile soft tissue swelling, gas, or collection. Other: No free air or free fluid in the abdomen. Minimal periumbilical hernia containing fat. No inguinal hernias. Musculoskeletal: No acute or significant osseous findings. Degenerative changes throughout the lumbar spine. Postoperative changes of the posterior elements at L5-S1. Degenerative changes in the hips. IMPRESSION: 1. No scrotal or penile abscess or infiltration demonstrated. 2. Bladder wall thickening may be due to underdistention or cystitis. Correlate with urinalysis. 3. No evidence of bowel obstruction or inflammation. 4. Aortic atherosclerosis. Electronically Signed   By: Elsie Gravely M.D.   On: 07/16/2024 20:37   US  SCROTUM W/DOPPLER Result Date: 07/16/2024 CLINICAL DATA:  Testicular pain on the left, history of prior right orchiectomy EXAM: SCROTAL ULTRASOUND DOPPLER ULTRASOUND OF THE TESTICLES TECHNIQUE: Complete ultrasound examination of the testicles, epididymis, and other scrotal structures was performed. Color and spectral Doppler ultrasound were also utilized to evaluate blood flow to the testicles. COMPARISON:  02/25/2018 FINDINGS: Right testicle Surgically absent Left testicle Measurements: 3.2 x 1.4 x 2.2 cm. No mass or microlithiasis visualized. Right epididymis:  Surgically removed Left epididymis: Multiple calcifications are noted. Some mobile echogenic foci are noted. This is of uncertain significance but may represent air within vascular structures. Hydrocele:  None  visualized. Varicocele: Prominent pampiniform plexus is noted on the left although no significant increase with Valsalva maneuvering is noted to suggest varicocele. Pulsed Doppler interrogation of the left testicle shows normal low resistance arterial and venous waveforms . IMPRESSION: Status post right orchiectomy Normal-appearing left testicle. Left epididymal calcifications are noted as well as some mobile echogenic foci suggestive of air. This may be related to an underlying infection. CT of the pelvis without contrast may be helpful to evaluate for foci of air in the region of the epididymis. Prominent pampiniform plexus on the left although no true varicocele is seen. Electronically Signed  By: Oneil Devonshire M.D.   On: 07/16/2024 19:23     Procedures   Medications Ordered in the ED  acetaminophen  (TYLENOL ) tablet 1,000 mg (1,000 mg Oral Given 07/18/24 1448)    Clinical Course as of 07/18/24 1526  Sun Jul 18, 2024  1435 MWF - full session friday [WF]    Clinical Course User Index [WF] Neldon Hamp RAMAN, [place]                                 Medical Decision Making Amount and/or Complexity of Data Reviewed Labs: ordered. Radiology: ordered.  Risk OTC drugs.   Patient is a 73 year old male with a past medical history significant for DM2, HTN, HIV, A-fib, asthma, CAD, pulmonary embolism, CKD on dialysis Monday Wednesday Friday had full dialysis session Friday, asthma, hyperlipidemia, history of CABG  Patient presents emergency room today with complaints of fever and abdominal pain.  Seems that he was seen 10/10 and prescribed Keflex  for presumed UTI however medication was not given to patient at SNF and today he developed a fever.  He states he has some abdominal discomfort lower abdomen.  Endorses some mild nausea but no vomiting.  No diarrhea no blood in his stool.  He does make urine but he states he does so intermittently he has noted some burning with urination.  He has noticed that  he has been coughing some over the past 7 days.  Today was his first day of fever.  CBC without leukocytosis or new anemia.  I-STAT Chem-8 with potassium 5.4 Lokelma  ordered, EKG ordered  Patient care transferred to evening team at this time.  Patient is a nontoxic-appearing 73 year old gentleman who is chronically quite ill at baseline respiratory panel pending lactic acid is normal and patient has no leukocytosis suspect fever may be viral in etiology versus perhaps--as considered by prior provider--patient may have UTI.   Final diagnoses:  None    ED Discharge Orders     None          Neldon Hamp RAMAN, [place] 07/18/24 1531    Tegeler, Lonni PARAS, MD 07/18/24 1539

## 2024-07-19 ENCOUNTER — Other Ambulatory Visit (HOSPITAL_COMMUNITY): Payer: Self-pay

## 2024-07-21 ENCOUNTER — Encounter (HOSPITAL_COMMUNITY)
Admission: RE | Disposition: A | Discharge status: Home / Self Care | Payer: Self-pay | Source: Home / Self Care | Attending: Vascular Surgery

## 2024-07-21 ENCOUNTER — Ambulatory Visit (HOSPITAL_COMMUNITY)
Admission: RE | Admit: 2024-07-21 | Discharge: 2024-07-21 | Disposition: A | Discharge status: Home / Self Care | Attending: Vascular Surgery | Admitting: Vascular Surgery

## 2024-07-21 ENCOUNTER — Other Ambulatory Visit: Payer: Self-pay

## 2024-07-21 DIAGNOSIS — Z992 Dependence on renal dialysis: Secondary | ICD-10-CM | POA: Insufficient documentation

## 2024-07-21 DIAGNOSIS — T82858A Stenosis of vascular prosthetic devices, implants and grafts, initial encounter: Secondary | ICD-10-CM | POA: Diagnosis present

## 2024-07-21 DIAGNOSIS — E1122 Type 2 diabetes mellitus with diabetic chronic kidney disease: Secondary | ICD-10-CM | POA: Insufficient documentation

## 2024-07-21 DIAGNOSIS — N186 End stage renal disease: Secondary | ICD-10-CM | POA: Diagnosis not present

## 2024-07-21 DIAGNOSIS — I120 Hypertensive chronic kidney disease with stage 5 chronic kidney disease or end stage renal disease: Secondary | ICD-10-CM | POA: Diagnosis not present

## 2024-07-21 DIAGNOSIS — Y832 Surgical operation with anastomosis, bypass or graft as the cause of abnormal reaction of the patient, or of later complication, without mention of misadventure at the time of the procedure: Secondary | ICD-10-CM | POA: Insufficient documentation

## 2024-07-21 HISTORY — PX: VENOUS ANGIOPLASTY: CATH118376

## 2024-07-21 HISTORY — PX: A/V FISTULAGRAM: CATH118298

## 2024-07-21 LAB — GLUCOSE, CAPILLARY: Glucose-Capillary: 175 mg/dL — ABNORMAL HIGH (ref 70–99)

## 2024-07-21 SURGERY — A/V FISTULAGRAM
Anesthesia: LOCAL | Site: Arm Upper | Laterality: Left

## 2024-07-21 MED ORDER — LIDOCAINE HCL (PF) 1 % IJ SOLN
INTRAMUSCULAR | Status: DC | PRN
  Administered 2024-07-21: 2 mL via SUBCUTANEOUS

## 2024-07-21 MED ORDER — LIDOCAINE HCL (PF) 1 % IJ SOLN
INTRAMUSCULAR | Status: AC
  Filled 2024-07-21: qty 30

## 2024-07-21 MED ORDER — HEPARIN (PORCINE) IN NACL 1000-0.9 UT/500ML-% IV SOLN
INTRAVENOUS | Status: DC | PRN
  Administered 2024-07-21: 500 mL

## 2024-07-21 MED ORDER — IODIXANOL 320 MG/ML IV SOLN
INTRAVENOUS | Status: DC | PRN
  Administered 2024-07-21: 35 mL via INTRAVENOUS

## 2024-07-21 SURGICAL SUPPLY — 10 items
BALLOON MUSTANG 7X60X75 (BALLOONS) IMPLANT
GUIDEWIRE ANGLED .035 180CM (WIRE) IMPLANT
KIT MICROPUNCTURE NIT STIFF (SHEATH) IMPLANT
MAT PREVALON FULL STRYKER (MISCELLANEOUS) IMPLANT
SHEATH PINNACLE R/O II 6F 4CM (SHEATH) IMPLANT
SHEATH PROBE COVER 6X72 (BAG) IMPLANT
STOPCOCK MORSE 400PSI 3WAY (MISCELLANEOUS) IMPLANT
SYR MEDALLION 10ML (SYRINGE) IMPLANT
TRAY PV CATH (CUSTOM PROCEDURE TRAY) ×2 IMPLANT
TUBING CIL FLEX 10 FLL-RA (TUBING) IMPLANT

## 2024-07-21 NOTE — H&P (Signed)
 HD ACCESS CENTER H&P   Patient ID: Michael [name], male   DOB: 1950/12/19, 73 y.o.   MRN: [id number]  Subjective:     HPI Michael [name] is a 73 y.o. male with ESRD presenting to the HD access center for intervention.  Past Medical History:  Diagnosis Date   A-fib ([name])    Anemia    Asthma    No PFTs, history of childhood asthma   CAD (coronary artery disease)    Cellulitis 04/2014   left facial   Cellulitis and abscess of toe of right foot 12/08/2019   Chondromalacia of medial femoral condyle    Left knee MRI 04/28/12: Chondromalacia of the medial femoral condyle with slight peripheral degeneration of the meniscocapsular junction of the medial meniscus; followed by sports medicine   Chronic kidney failure, stage 4 (severe) ([name])    Collagen vascular disease    Crack cocaine use    for 20+ years, has been enrolled in detox programs in the past   Depression    with history of hospitalization for suicidal ideation   Diabetes mellitus 2002   Diagnosed in 2002, started insulin  in 2012   Gout    Headache(784.0)    CT head 08/2011: Periventricular and subcortical white matter hypodensities are most in keeping with chronic microangiopathic change   HIV infection ([name]) 08/2011   Followed by Dr. Eben   Hyperlipidemia    Hypertension    Pulmonary embolism ([name])    Family History  Problem Relation Age of Onset   Diabetes Mother    Hypertension Mother    Hyperlipidemia Mother    Diabetes Father    Cancer Father    Hypertension Father    Diabetes Brother    Heart disease Brother    Diabetes Sister    Colon cancer Neg Hx    Past Surgical History:  Procedure Laterality Date   A/V SHUNT INTERVENTION N/A 01/27/2024   Procedure: A/V SHUNT INTERVENTION;  Surgeon: Melia Lynwood ORN, MD;  Location: [facility];  Service: Cardiovascular;  Laterality: N/A;   A/V SHUNT INTERVENTION N/A 02/19/2024   Procedure: A/V SHUNT INTERVENTION;  Surgeon: Norine Manuelita LABOR, MD;   Location: [facility];  Service: Cardiovascular;  Laterality: N/A;   AMPUTATION Right 07/21/2019   Procedure: RIGHT SECOND TOE AMPUTATION;  Surgeon: Harden Jerona GAILS, MD;  Location: [facility] OR;  Service: Orthopedics;  Laterality: Right;   BACK SURGERY     1988   BIOPSY  03/20/2023   Procedure: BIOPSY;  Surgeon: San Sandor GAILS, DO;  Location: [facility]OS[facility];  Service: Gastroenterology;;   BOWEL RESECTION     CARDIAC SURGERY     CERVICAL SPINE SURGERY      rods in my neck    COLONOSCOPY WITH PROPOFOL  N/A 03/20/2023   Procedure: COLONOSCOPY WITH PROPOFOL ;  Surgeon: San Sandor GAILS, DO;  Location: [facility];  Service: Gastroenterology;  Laterality: N/A;   CORONARY ARTERY BYPASS GRAFT     CORONARY STENT PLACEMENT     ESOPHAGOGASTRODUODENOSCOPY (EGD) WITH PROPOFOL  N/A 03/20/2023   Procedure: ESOPHAGOGASTRODUODENOSCOPY (EGD) WITH PROPOFOL ;  Surgeon: San Sandor GAILS, DO;  Location: [facility];  Service: Gastroenterology;  Laterality: N/A;   NM MYOCAR PERF WALL MOTION  12/27/2011   normal   POLYPECTOMY  03/20/2023   Procedure: POLYPECTOMY;  Surgeon: San Sandor GAILS, DO;  Location: [facility];  Service: Gastroenterology;;   SPINE SURGERY     VENOUS ANGIOPLASTY  01/27/2024   Procedure: VENOUS ANGIOPLASTY;  Surgeon: Melia Lynwood ORN, MD;  Location: [facility] INVASIVE CV LAB;  Service: Cardiovascular;;  70 % VA    Short Social History:  Social History   Tobacco Use   Smoking status: Never   Smokeless tobacco: Never  Substance Use Topics   Alcohol use: No    Alcohol/week: 4.0 standard drinks of alcohol    Types: 2 Cans of beer, 2 Shots of liquor per week    No Known Allergies  No current facility-administered medications for this encounter.    REVIEW OF SYSTEMS All other systems were reviewed and are negative     Objective:   Objective   Vitals:   07/21/24 0837 07/21/24 0840  BP: (!) 171/91   Pulse: 66   Resp: 12 14  Temp: 97.8 F (36.6 C)   TempSrc: Oral   SpO2:  98% 97%   There is no height or weight on file to calculate BMI.  Physical Exam General: no acute distress Cardiac: hemodynamically stable Extremities: Palpable thrill in left arm aVF  Data: Reviewed fistulogram from Dr. Norine in May. Normal fistulogram     Assessment/Plan:   Michael [name] is a 73 y.o. male with ESRD presenting for fistulogram.  Having issues with difficult cannulation and intermittent low flows. Last HD session Monday. Reviewed risks and benefits of fistulogram with intervention and patient agreed to proceed.   Norman Serve, MD [facility]

## 2024-07-21 NOTE — Op Note (Signed)
    Patient name: Michael [name] MRN: [id number] DOB: 1951/07/26 Sex: male  07/21/2024 Pre-operative Diagnosis: ESRD on HD Post-operative diagnosis:  Same Surgeon:  Norman GORMAN Serve, MD Procedure Performed:  Ultrasound-guided access of dialysis circuit, fistulogram and central venogram, peripheral balloon angioplasty.  [id number]  Indications: [name] is 73 year old male with ESRD on HD presenting to the HD access center today for fistulogram.  He has been having issues with difficulty in cannulation and intermittent low flows.  His last HD session was Monday.  Risks and benefits of fistulogram were reviewed and he elected to proceed.  Findings:  Widely patent central venous system.  There is a severe 90% stenosis in the brachiobasilic fistula at the axilla.  The fistula in the mid upper arm and the anastomosis are widely patent.   Procedure:  The patient was identified in the holding area and taken to the cath lab  The patient was then placed supine on the table and prepped and draped in the usual sterile fashion.  A time out was called.  Ultrasound was used to evaluate the left arm AV access. This was accessed under u/s guidance. An 018 wire was advanced without resistance, a micropuncture sheath was placed and fistulagram obtained which demonstrated the above findings.  This access was then upsized to a 6 F short sheath over a glidewire.  The lesion was crossed with a Glidewire and treated with a 7 mm x 60 mm Mustang balloon with a less than 20% residual stenosis.  The wire and sheath were removed and the access was managed with a 4-0 Monocryl figure-of-eight suture for hemostasis.  Contrast: 35 cc Sedation: None  Impression: Successful balloon angioplasty of the severe stenosis at the axilla.  7 mm x 60 mm Mustang balloon. Remains amenable to endovascular treatment   Norman GORMAN Serve MD [facility] [place] Office: [phone]

## 2024-07-22 ENCOUNTER — Encounter (HOSPITAL_COMMUNITY): Payer: Self-pay | Admitting: Vascular Surgery

## 2024-08-26 ENCOUNTER — Ambulatory Visit: Admitting: Internal Medicine

## 2024-09-28 ENCOUNTER — Encounter (HOSPITAL_COMMUNITY): Payer: Self-pay | Admitting: Vascular Surgery

## 2024-09-28 ENCOUNTER — Ambulatory Visit (HOSPITAL_COMMUNITY)
Admission: RE | Admit: 2024-09-28 | Discharge: 2024-09-28 | Disposition: A | Discharge status: Home / Self Care | Attending: Vascular Surgery | Admitting: Vascular Surgery

## 2024-09-28 ENCOUNTER — Other Ambulatory Visit: Payer: Self-pay

## 2024-09-28 ENCOUNTER — Encounter (HOSPITAL_COMMUNITY): Admission: RE | Disposition: A | Payer: Self-pay | Attending: Vascular Surgery

## 2024-09-28 DIAGNOSIS — I120 Hypertensive chronic kidney disease with stage 5 chronic kidney disease or end stage renal disease: Secondary | ICD-10-CM | POA: Insufficient documentation

## 2024-09-28 DIAGNOSIS — Z95828 Presence of other vascular implants and grafts: Secondary | ICD-10-CM | POA: Insufficient documentation

## 2024-09-28 DIAGNOSIS — Z992 Dependence on renal dialysis: Secondary | ICD-10-CM | POA: Diagnosis not present

## 2024-09-28 DIAGNOSIS — Y832 Surgical operation with anastomosis, bypass or graft as the cause of abnormal reaction of the patient, or of later complication, without mention of misadventure at the time of the procedure: Secondary | ICD-10-CM | POA: Diagnosis not present

## 2024-09-28 DIAGNOSIS — E1122 Type 2 diabetes mellitus with diabetic chronic kidney disease: Secondary | ICD-10-CM | POA: Insufficient documentation

## 2024-09-28 DIAGNOSIS — N186 End stage renal disease: Secondary | ICD-10-CM | POA: Insufficient documentation

## 2024-09-28 DIAGNOSIS — T829XXA Unspecified complication of cardiac and vascular prosthetic device, implant and graft, initial encounter: Secondary | ICD-10-CM | POA: Diagnosis not present

## 2024-09-28 HISTORY — PX: A/V FISTULAGRAM: CATH118298

## 2024-09-28 SURGERY — A/V FISTULAGRAM
Anesthesia: LOCAL | Site: Arm Upper | Laterality: Left

## 2024-09-28 MED ORDER — IODIXANOL 320 MG/ML IV SOLN
INTRAVENOUS | Status: DC | PRN
  Administered 2024-09-28: 28 mL via INTRAVENOUS

## 2024-09-28 MED ORDER — LIDOCAINE HCL (PF) 1 % IJ SOLN
INTRAMUSCULAR | Status: AC
  Filled 2024-09-28: qty 30

## 2024-09-28 MED ORDER — LIDOCAINE HCL (PF) 1 % IJ SOLN
INTRAMUSCULAR | Status: DC | PRN
  Administered 2024-09-28: 5 mL

## 2024-09-28 MED ORDER — HEPARIN (PORCINE) IN NACL 1000-0.9 UT/500ML-% IV SOLN
INTRAVENOUS | Status: DC | PRN
  Administered 2024-09-28: 500 mL

## 2024-09-28 SURGICAL SUPPLY — 6 items
KIT MICROPUNCTURE NIT STIFF (SHEATH) IMPLANT
KIT PV (KITS) ×1 IMPLANT
SHEATH PROBE COVER 6X72 (BAG) IMPLANT
STOPCOCK MORSE 400PSI 3WAY (MISCELLANEOUS) IMPLANT
TRAY PV CATH (CUSTOM PROCEDURE TRAY) ×1 IMPLANT
TUBING CIL FLEX 10 FLL-RA (TUBING) IMPLANT

## 2024-09-28 NOTE — H&P (Signed)
 " HD ACCESS CENTER H&P   Patient ID: Jeanpaul Biehl, male   DOB: 15-Oct-1950, 73 y.o.   MRN: [id number]  Subjective:     HPI Nickoles Gregori is a 73 y.o. male with ESRD presenting for evaluation of HD access and consideration of intervention. Referred by: Dr. Melia Having issues with difficult cannulation.  He had a recent fistulogram in [facility] about 2 weeks ago.  Past Medical History:  Diagnosis Date   A-fib ([name])    Anemia    Asthma    No PFTs, history of childhood asthma   CAD (coronary artery disease)    Cellulitis 04/2014   left facial   Cellulitis and abscess of toe of right foot 12/08/2019   Chondromalacia of medial femoral condyle    Left knee MRI 04/28/12: Chondromalacia of the medial femoral condyle with slight peripheral degeneration of the meniscocapsular junction of the medial meniscus; followed by sports medicine   Chronic kidney failure, stage 4 (severe) ([name])    Collagen vascular disease    Crack cocaine use    for 20+ years, has been enrolled in detox programs in the past   Depression    with history of hospitalization for suicidal ideation   Diabetes mellitus 2002   Diagnosed in 2002, started insulin  in 2012   Gout    Headache(784.0)    CT head 08/2011: Periventricular and subcortical white matter hypodensities are most in keeping with chronic microangiopathic change   HIV infection ([name]) 08/2011   Followed by Dr. Eben   Hyperlipidemia    Hypertension    Pulmonary embolism ([name])    Family History  Problem Relation Age of Onset   Diabetes Mother    Hypertension Mother    Hyperlipidemia Mother    Diabetes Father    Cancer Father    Hypertension Father    Diabetes Brother    Heart disease Brother    Diabetes Sister    Colon cancer Neg Hx    Past Surgical History:  Procedure Laterality Date   A/V FISTULAGRAM Left 07/21/2024   Procedure: A/V Fistulagram;  Surgeon: Pearline Norman RAMAN, MD;  Location: [facility];  Service: Cardiovascular;   Laterality: Left;   A/V SHUNT INTERVENTION N/A 01/27/2024   Procedure: A/V SHUNT INTERVENTION;  Surgeon: Melia Lynwood ORN, MD;  Location: [facility] INVASIVE CV LAB;  Service: Cardiovascular;  Laterality: N/A;   A/V SHUNT INTERVENTION N/A 02/19/2024   Procedure: A/V SHUNT INTERVENTION;  Surgeon: Norine Manuelita LABOR, MD;  Location: [facility];  Service: Cardiovascular;  Laterality: N/A;   AMPUTATION Right 07/21/2019   Procedure: RIGHT SECOND TOE AMPUTATION;  Surgeon: Harden Jerona GAILS, MD;  Location: [name] OR;  Service: Orthopedics;  Laterality: Right;   BACK SURGERY     1988   BIOPSY  03/20/2023   Procedure: BIOPSY;  Surgeon: San Sandor GAILS, DO;  Location: [facility];  Service: Gastroenterology;;   BOWEL RESECTION     CARDIAC SURGERY     CERVICAL SPINE SURGERY      rods in my neck    COLONOSCOPY WITH PROPOFOL  N/A 03/20/2023   Procedure: COLONOSCOPY WITH PROPOFOL ;  Surgeon: San Sandor GAILS, DO;  Location: [facility];  Service: Gastroenterology;  Laterality: N/A;   CORONARY ARTERY BYPASS GRAFT     CORONARY STENT PLACEMENT     ESOPHAGOGASTRODUODENOSCOPY (EGD) WITH PROPOFOL  N/A 03/20/2023   Procedure: ESOPHAGOGASTRODUODENOSCOPY (EGD) WITH PROPOFOL ;  Surgeon: San Sandor GAILS, DO;  Location: [facility];  Service: Gastroenterology;  Laterality: N/A;   NM MYOCAR PERF WALL MOTION  12/27/2011   normal   POLYPECTOMY  03/20/2023   Procedure: POLYPECTOMY;  Surgeon: San Sandor GAILS, DO;  Location: [facility];  Service: Gastroenterology;;   SPINE SURGERY     VENOUS ANGIOPLASTY  01/27/2024   Procedure: VENOUS ANGIOPLASTY;  Surgeon: Melia Lynwood ORN, MD;  Location: [facility] INVASIVE CV LAB;  Service: Cardiovascular;;  70 % VA   VENOUS ANGIOPLASTY Left 07/21/2024   Procedure: VENOUS ANGIOPLASTY;  Surgeon: Pearline Norman RAMAN, MD;  Location: [facility];  Service: Cardiovascular;  Laterality: Left;  Venous Anastomosis    Short Social History:  Social History   Tobacco Use   Smoking status: Never    Smokeless tobacco: Never  Substance Use Topics   Alcohol use: No    Alcohol/week: 4.0 standard drinks of alcohol    Types: 2 Cans of beer, 2 Shots of liquor per week    Allergies[1]  No current facility-administered medications for this encounter.    REVIEW OF SYSTEMS All other systems were reviewed and are negative     Objective:   Objective   Vitals:   09/28/24 0915  BP: (!) 149/77  Pulse: 73  Resp: 16  SpO2: 97%   There is no height or weight on file to calculate BMI.  Physical Exam General: no acute distress Cardiac: hemodynamically stable Extremities: Strong thrill in left arm aVF  Data: Fistulogram from October was reviewed. A stenosis at the axilla was treated with a 7 mm x 60 mm Mustang balloon.   I am unable to see the images from [facility] from his recent fistulogram a few weeks ago.     Assessment/Plan:   Daivon Rayos is a 73 y.o. male with ESRD and a poorly functioning left arm AVF. Having issues with difficult cannulation.  We discussed first-line option of fistulogram to better evaluate flow as well as potentially treat flow-limiting stenoses that could put this access at risk of future thrombosis. Risks and benefits were reviewed and the patient elected to proceed.   Norman Pearline, MD [facility]      [1] No Known Allergies  "

## 2024-09-28 NOTE — Op Note (Signed)
" ° ° °  Patient name: Michael [name] MRN: [id number] DOB: September 21, 1951 Sex: male  09/28/2024 Pre-operative Diagnosis: ESRD on HD Post-operative diagnosis:  Same Surgeon:  Norman GORMAN Serve, MD Procedure Performed:  Outpatient evaluation, level 3 Ultrasound-guided access of dialysis circuit, fistulogram and central venogram.  [id number]  Indications: Mr. Krogh is a 73 year old male with ESRD and a poorly functioning left arm AV fistula.  He been having issues with difficult cannulation.  He did recently undergo fistulogram a few weeks ago in [facility] but is unsure what they did and am unable to see the pictures.  He also underwent a fistulogram with me back in October and a severe stenosis in the axillary vein was treated.  Risks and benefits of fistulogram with intervention were reviewed and he elected to proceed.  Findings:  Widely patent central venous system.  Widely patent AV fistula and anastomosis.   Procedure:  The patient was identified in the holding area and taken to the cath lab  The patient was then placed supine on the table and prepped and draped in the usual sterile fashion.  A time out was called.  Ultrasound was used to evaluate the left arm AV access. This was accessed under u/s guidance. An 018 wire was advanced without resistance, a micropuncture sheath was placed and fistulagram obtained which demonstrated the above findings.  The micro sheath was removed and a 4-0 Monocryl figure-of-eight suture was placed for hemostasis.  Contrast: 28 cc Sedation: None  Impression: Widely patent fistula and central venous system.  Please do not schedule him for another fistulogram for the reason of difficulty cannulation.   Norman GORMAN Serve MD [facility] Office: [phone]  "
